# Patient Record
Sex: Male | Born: 1980 | Race: Black or African American | Hispanic: No | Marital: Single | Smoking: Never smoker
Health system: Southern US, Community
[De-identification: ages and names within clinical notes are randomized; demographics above are authoritative.]

## PROBLEM LIST (undated history)

## (undated) DIAGNOSIS — L97424 Non-pressure chronic ulcer of left heel and midfoot with necrosis of bone: Secondary | ICD-10-CM

## (undated) DIAGNOSIS — L03032 Cellulitis of left toe: Secondary | ICD-10-CM

## (undated) DIAGNOSIS — N186 End stage renal disease: Secondary | ICD-10-CM

## (undated) DIAGNOSIS — I10 Essential (primary) hypertension: Secondary | ICD-10-CM

## (undated) DIAGNOSIS — L02612 Cutaneous abscess of left foot: Secondary | ICD-10-CM

## (undated) DIAGNOSIS — Z992 Dependence on renal dialysis: Secondary | ICD-10-CM

## (undated) DIAGNOSIS — E1143 Type 2 diabetes mellitus with diabetic autonomic (poly)neuropathy: Secondary | ICD-10-CM

## (undated) DIAGNOSIS — E119 Type 2 diabetes mellitus without complications: Secondary | ICD-10-CM

## (undated) DIAGNOSIS — A419 Sepsis, unspecified organism: Secondary | ICD-10-CM

## (undated) DIAGNOSIS — E08621 Diabetes mellitus due to underlying condition with foot ulcer: Secondary | ICD-10-CM

## (undated) DIAGNOSIS — K3184 Gastroparesis: Secondary | ICD-10-CM

## (undated) DIAGNOSIS — N289 Disorder of kidney and ureter, unspecified: Secondary | ICD-10-CM

## (undated) DIAGNOSIS — D649 Anemia, unspecified: Secondary | ICD-10-CM

## (undated) HISTORY — PX: AV FISTULA PLACEMENT: SHX1204

## (undated) HISTORY — PX: INSERTION OF DIALYSIS CATHETER: SHX1324

## (undated) HISTORY — PX: EYE SURGERY: SHX253

---

## 1998-02-04 ENCOUNTER — Emergency Department (HOSPITAL_COMMUNITY): Admission: EM | Admit: 1998-02-04 | Discharge: 1998-02-04 | Payer: Self-pay | Admitting: Emergency Medicine

## 2000-11-14 ENCOUNTER — Inpatient Hospital Stay (HOSPITAL_COMMUNITY): Admission: EM | Admit: 2000-11-14 | Discharge: 2000-11-16 | Payer: Self-pay | Admitting: Emergency Medicine

## 2000-11-14 ENCOUNTER — Encounter: Payer: Self-pay | Admitting: Internal Medicine

## 2000-11-15 ENCOUNTER — Encounter: Payer: Self-pay | Admitting: Internal Medicine

## 2002-02-01 ENCOUNTER — Inpatient Hospital Stay (HOSPITAL_COMMUNITY): Admission: EM | Admit: 2002-02-01 | Discharge: 2002-02-06 | Payer: Self-pay | Admitting: Emergency Medicine

## 2002-02-03 ENCOUNTER — Encounter: Payer: Self-pay | Admitting: Family Medicine

## 2002-02-07 ENCOUNTER — Encounter: Admission: RE | Admit: 2002-02-07 | Discharge: 2002-02-07 | Payer: Self-pay | Admitting: Family Medicine

## 2002-03-25 ENCOUNTER — Inpatient Hospital Stay (HOSPITAL_COMMUNITY): Admission: EM | Admit: 2002-03-25 | Discharge: 2002-03-29 | Payer: Self-pay | Admitting: *Deleted

## 2002-03-25 ENCOUNTER — Encounter: Payer: Self-pay | Admitting: Emergency Medicine

## 2002-03-28 ENCOUNTER — Encounter: Payer: Self-pay | Admitting: Internal Medicine

## 2002-04-22 ENCOUNTER — Encounter: Payer: Self-pay | Admitting: Internal Medicine

## 2002-04-22 ENCOUNTER — Inpatient Hospital Stay (HOSPITAL_COMMUNITY): Admission: EM | Admit: 2002-04-22 | Discharge: 2002-04-24 | Payer: Self-pay | Admitting: Emergency Medicine

## 2002-12-01 ENCOUNTER — Encounter: Payer: Self-pay | Admitting: Emergency Medicine

## 2002-12-01 ENCOUNTER — Emergency Department (HOSPITAL_COMMUNITY): Admission: EM | Admit: 2002-12-01 | Discharge: 2002-12-01 | Payer: Self-pay | Admitting: Internal Medicine

## 2003-05-02 ENCOUNTER — Inpatient Hospital Stay (HOSPITAL_COMMUNITY): Admission: EM | Admit: 2003-05-02 | Discharge: 2003-05-03 | Payer: Self-pay | Admitting: Emergency Medicine

## 2003-08-27 ENCOUNTER — Emergency Department (HOSPITAL_COMMUNITY): Admission: EM | Admit: 2003-08-27 | Discharge: 2003-08-27 | Payer: Self-pay | Admitting: Emergency Medicine

## 2003-10-02 ENCOUNTER — Emergency Department (HOSPITAL_COMMUNITY): Admission: EM | Admit: 2003-10-02 | Discharge: 2003-10-02 | Payer: Self-pay | Admitting: Emergency Medicine

## 2004-03-29 ENCOUNTER — Inpatient Hospital Stay (HOSPITAL_COMMUNITY): Admission: EM | Admit: 2004-03-29 | Discharge: 2004-03-30 | Payer: Self-pay | Admitting: Emergency Medicine

## 2004-04-19 ENCOUNTER — Emergency Department (HOSPITAL_COMMUNITY): Admission: EM | Admit: 2004-04-19 | Discharge: 2004-04-19 | Payer: Self-pay | Admitting: Emergency Medicine

## 2004-04-22 ENCOUNTER — Inpatient Hospital Stay (HOSPITAL_COMMUNITY): Admission: EM | Admit: 2004-04-22 | Discharge: 2004-04-24 | Payer: Self-pay | Admitting: Emergency Medicine

## 2004-05-28 ENCOUNTER — Encounter: Payer: Self-pay | Admitting: Internal Medicine

## 2004-05-28 ENCOUNTER — Inpatient Hospital Stay (HOSPITAL_COMMUNITY): Admission: EM | Admit: 2004-05-28 | Discharge: 2004-05-29 | Payer: Self-pay | Admitting: Internal Medicine

## 2004-05-31 ENCOUNTER — Inpatient Hospital Stay (HOSPITAL_COMMUNITY): Admission: EM | Admit: 2004-05-31 | Discharge: 2004-06-01 | Payer: Self-pay | Admitting: Emergency Medicine

## 2004-09-19 ENCOUNTER — Ambulatory Visit: Payer: Self-pay | Admitting: Nurse Practitioner

## 2004-09-23 ENCOUNTER — Ambulatory Visit: Payer: Self-pay | Admitting: Nurse Practitioner

## 2004-09-23 ENCOUNTER — Ambulatory Visit: Payer: Self-pay | Admitting: *Deleted

## 2004-12-08 ENCOUNTER — Ambulatory Visit: Payer: Self-pay | Admitting: Nurse Practitioner

## 2004-12-09 ENCOUNTER — Emergency Department (HOSPITAL_COMMUNITY): Admission: EM | Admit: 2004-12-09 | Discharge: 2004-12-09 | Payer: Self-pay | Admitting: Emergency Medicine

## 2004-12-10 ENCOUNTER — Emergency Department (HOSPITAL_COMMUNITY): Admission: EM | Admit: 2004-12-10 | Discharge: 2004-12-10 | Payer: Self-pay | Admitting: Emergency Medicine

## 2004-12-11 ENCOUNTER — Inpatient Hospital Stay (HOSPITAL_COMMUNITY): Admission: EM | Admit: 2004-12-11 | Discharge: 2004-12-13 | Payer: Self-pay | Admitting: Emergency Medicine

## 2004-12-14 ENCOUNTER — Inpatient Hospital Stay (HOSPITAL_COMMUNITY): Admission: EM | Admit: 2004-12-14 | Discharge: 2004-12-16 | Payer: Self-pay | Admitting: Emergency Medicine

## 2005-02-16 ENCOUNTER — Ambulatory Visit: Payer: Self-pay | Admitting: Nurse Practitioner

## 2005-03-28 ENCOUNTER — Inpatient Hospital Stay (HOSPITAL_COMMUNITY): Admission: EM | Admit: 2005-03-28 | Discharge: 2005-03-29 | Payer: Self-pay | Admitting: Emergency Medicine

## 2005-03-31 ENCOUNTER — Ambulatory Visit: Payer: Self-pay | Admitting: Internal Medicine

## 2005-03-31 ENCOUNTER — Ambulatory Visit: Payer: Self-pay | Admitting: Infectious Diseases

## 2005-03-31 ENCOUNTER — Inpatient Hospital Stay (HOSPITAL_COMMUNITY): Admission: EM | Admit: 2005-03-31 | Discharge: 2005-04-05 | Payer: Self-pay | Admitting: Emergency Medicine

## 2005-04-02 ENCOUNTER — Ambulatory Visit: Payer: Self-pay | Admitting: Internal Medicine

## 2010-06-11 ENCOUNTER — Emergency Department (HOSPITAL_COMMUNITY): Admission: EM | Admit: 2010-06-11 | Discharge: 2010-06-11 | Payer: Self-pay | Admitting: Emergency Medicine

## 2010-12-11 LAB — CBC
HCT: 31.8 % — ABNORMAL LOW (ref 39.0–52.0)
Hemoglobin: 10.9 g/dL — ABNORMAL LOW (ref 13.0–17.0)
MCH: 30.7 pg (ref 26.0–34.0)
MCHC: 34.3 g/dL (ref 30.0–36.0)
MCV: 89.5 fL (ref 78.0–100.0)
Platelets: 391 10*3/uL (ref 150–400)
RBC: 3.56 MIL/uL — ABNORMAL LOW (ref 4.22–5.81)
RDW: 13.2 % (ref 11.5–15.5)
WBC: 6 10*3/uL (ref 4.0–10.5)

## 2010-12-11 LAB — GLUCOSE, CAPILLARY
Glucose-Capillary: 130 mg/dL — ABNORMAL HIGH (ref 70–99)
Glucose-Capillary: 69 mg/dL — ABNORMAL LOW (ref 70–99)
Glucose-Capillary: 84 mg/dL (ref 70–99)

## 2010-12-11 LAB — COMPREHENSIVE METABOLIC PANEL
ALT: 17 U/L (ref 0–53)
AST: 22 U/L (ref 0–37)
Albumin: 3.3 g/dL — ABNORMAL LOW (ref 3.5–5.2)
Alkaline Phosphatase: 101 U/L (ref 39–117)
BUN: 21 mg/dL (ref 6–23)
CO2: 27 mEq/L (ref 19–32)
Calcium: 9.2 mg/dL (ref 8.4–10.5)
Chloride: 106 mEq/L (ref 96–112)
Creatinine, Ser: 2.48 mg/dL — ABNORMAL HIGH (ref 0.4–1.5)
GFR calc Af Amer: 37 mL/min — ABNORMAL LOW (ref >60)
GFR calc non Af Amer: 31 mL/min — ABNORMAL LOW (ref >60)
Glucose, Bld: 104 mg/dL — ABNORMAL HIGH (ref 70–99)
Potassium: 3.9 mEq/L (ref 3.5–5.1)
Sodium: 141 mEq/L (ref 135–145)
Total Bilirubin: 0.4 mg/dL (ref 0.3–1.2)
Total Protein: 6.9 g/dL (ref 6.0–8.3)

## 2010-12-11 LAB — DIFFERENTIAL
Basophils Absolute: 0.1 10*3/uL (ref 0.0–0.1)
Basophils Relative: 1 % (ref 0–1)
Eosinophils Absolute: 0.3 10*3/uL (ref 0.0–0.7)
Eosinophils Relative: 4 % (ref 0–5)
Lymphocytes Relative: 52 % — ABNORMAL HIGH (ref 12–46)
Lymphs Abs: 3.1 10*3/uL (ref 0.7–4.0)
Monocytes Absolute: 0.4 10*3/uL (ref 0.1–1.0)
Monocytes Relative: 7 % (ref 3–12)
Neutro Abs: 2.2 10*3/uL (ref 1.7–7.7)
Neutrophils Relative %: 36 % — ABNORMAL LOW (ref 43–77)

## 2010-12-11 LAB — URINE MICROSCOPIC-ADD ON

## 2010-12-11 LAB — URINALYSIS, ROUTINE W REFLEX MICROSCOPIC
Bilirubin Urine: NEGATIVE
Glucose, UA: NEGATIVE mg/dL
Ketones, ur: NEGATIVE mg/dL
Leukocytes, UA: NEGATIVE
Nitrite: NEGATIVE
Protein, ur: >300 mg/dL — AB
Specific Gravity, Urine: 1.013 (ref 1.005–1.030)
Urobilinogen, UA: 0.2 mg/dL (ref 0.0–1.0)
pH: 6 (ref 5.0–8.0)

## 2011-02-13 NOTE — Discharge Summary (Signed)
NAMELOEL, DAGUE                             ACCOUNT NO.:  [id number]   MEDICAL RECORD NO.:  [id number]                   PATIENT TYPE:  INP   LOCATION:  [place]                                 FACILITY:  [facility]   PHYSICIAN:  Melissa L. Lovena Le, MD               DATE OF BIRTH:  [birth year]   DATE OF ADMISSION:  03/29/2004  DATE OF DISCHARGE:  03/30/2004                                 DISCHARGE SUMMARY   ADMITTING COMPLAINT:  1. Nausea and vomiting.  2. Diabetic ketoacidosis resulting in #1.   DISCHARGE DIAGNOSIS:  Diabetic ketoacidosis likely secondary to  noncompliance.   MEDICATIONS AT THE TIME OF DISCHARGE:  70/30 Insulin 20 units b.i.d.   HISTORY OF PRESENT ILLNESS:  Patient is a 30 year old African-American male  with a frequent past history for medical noncompliance who comes to the  emergency room with 24 hours of nausea and vomiting.  Patient could not  state any inciting factor and in the emergency room was found to be in DKA  with a blood glucose of 365, CO2 of 9, positive serum ketones, his pH on  admission was 7.19 with a pCO2 of 15.3, pO2 of 123, and a bicarb of 5.7.  In  the emergency room the patient was given 10 units of Regular Insulin IV and  started on a Glucomander protocol, he was aggressively rehydrated, his  potassium was repleted, he was admitted to the telemetry floor for further  evaluation.  Within 12 hours the patient's gap closed, he was able to keep  some food down.  Forty-eight hours after admission the patient was taking  his 70/30 Insulin, tolerating full meals and did not complain of any nausea  and vomiting.   On the day of discharge his vital signs were a temperature of [id number] with a  systolic blood pressure of 142/89, pulse of 85, respiratory rate of 18, his  blood glucoses ranged from 74 to 256.  His physical exam remained a well-  developed, well-nourished African-American male in no acute distress; his  pupils are equal, round, and reactive to  light, extraocular muscles are  intact; mucous membranes are moist; his neck is supple, there is no JVD or  lymph nodes; chest is clear to auscultation; cardiovascular regular rate and  rhythm, positive S1, S2, no S3, S4, no murmurs, rubs, or gallops; abdomen is  soft, nontender, nondistended, with positive bowel sounds; extremities  showed no clubbing, cyanosis, or edema.   Other pertinent laboratory values reveal no obvious infection; his  urinalysis was negative; an HIV test was done as the patient did have a  history of genital herpes, this was not reactive; amylase and lipase were  negative; his urine drug screen did show marijuana recently and the patient  was counseled on the use of and abuse of illicit drugs.   At the time of discharge the patient was provided with a  vial of 70/30 and  information on following up with [facility] who is his primary healthcare  [facility].  The patient was counseled on obtaining a Glucometer and he  was requested to follow up with [facility]'s diabetic educator.  The  patient has been noncompliant in the past and did not appear to understand  the significance of maintaining his glucose control.  This was therefore  reintroduced to him.                                               Melissa L. Lovena Le, MD    MLT/MEDQ  D:  03/31/2004  T:  03/31/2004  Job:  [id number]   cc:   Karoline Caldwell

## 2011-02-13 NOTE — H&P (Signed)
[name], [name]                 ACCOUNT NO.:  [id number]   MEDICAL RECORD NO.:  [id number]          PATIENT TYPE:  INP   LOCATION:  0101                         FACILITY:  [facility]   PHYSICIAN:  Corinna L. Conley Canal, MDDATE OF BIRTH:  07-13-1981   DATE OF ADMISSION:  12/14/2004  DATE OF DISCHARGE:                                HISTORY & PHYSICAL   CHIEF COMPLAINT:  Vomiting.   HISTORY OF PRESENT ILLNESS:  The patient is a 30 year old black male with  type 1 diabetes who just left against medical advice from [facility] from the [facility] service. He had been admitted for nausea  and vomiting and according to him he was told that he had a virus. I have  no chart available. I have the H&P and some lab work but no discharge  summary. He was tolerating clear liquids yesterday and left against medical  advice but returned with vomiting. He is unable to even keep down liquids.  He has vomited here a bit while here in the emergency room.   PAST MEDICAL HISTORY:  Type 1 diabetes since age 49.   MEDICATIONS:  1.  Phenergan tablets which are not helping him.  2.  Insulin 70/30 with 15 units subcutaneously b.i.d.   SOCIAL HISTORY:  Reviewed and as per previous.   FAMILY HISTORY:  Reviewed and as per previous.   REVIEW OF SYMPTOMS:  As above. Otherwise negative.   PHYSICAL EXAMINATION:  VITAL SIGNS:  Temperature is 100, blood pressure  137/87, pulse 108, respiratory rate 32. Oxygen saturation 100% on room air.  GENERAL:  The patient is groggy black male in no acute distress.  HEENT:  Normocephalic and atraumatic. Pupils are equal, round, and reactive  to light. Sclerae are nonicteric. Conjunctivae are pink. He has slightly dry  mucus membranes. Oropharynx is without erythema or exudates.  NECK:  Supple. No lymphadenopathy. No thyromegaly.  LUNGS:  Clear to auscultation bilaterally without wheezes, rhonchi, or  rales.  CARDIOVASCULAR:  Regular rate and rhythm without  murmurs, gallops, or rubs.  ABDOMEN:  Normal bowel sounds. Soft, nontender, and nondistended.  GENITOURINARY:  Deferred.  RECTAL:  Deferred.  EXTREMITIES:  No clubbing, cyanosis, or edema.  SKIN:  No rash.  PSYCHIATRIC:  Normal affect.  NEUROLOGICAL:  Groggy but oriented. Cranial nerves and sensory motor exam  are intact.   LABORATORY DATA:  CBC is unremarkable. Sodium was 134, glucose is 241.  Otherwise his basic metabolic panel is unremarkable. His total bilirubin is  5.0. Otherwise his LFTs are within normal limits. UA reveals greater than  1000 glucose, moderate bilirubin, greater than 80 ketones.  Negative  nitrites, negative leukocyte esterase.   ASSESSMENT:  1.  Recurrent nausea and vomiting: Most likely secondary to viral      gastroenteritis. I have asked for old records but they are unavailable.      It does look as if he had an acute abdominal series and his chest x-ray      during this last hospitalization which were unremarkable. He had a  lipase that was normal. His total bilirubin last time was 3. Given his      increased bilirubin, I will check hepatitis A, B, and C screen, although      the rest of his liver function tests are normal. He has no abdominal      tenderness or pain to suggest gallbladder disease. He will be given      Reglan and p.r.n. Phenergan, intravenous fluids, Protonix.  2.  Type 1 diabetes:  I will continue his 70/30 insulin and give sliding      scale.      CLS/MEDQ  D:  12/14/2004  T:  12/15/2004  Job:  [id number]   cc:   Karoline Caldwell

## 2011-02-13 NOTE — H&P (Signed)
NAMEHUTCHISON, REDDISH                 ACCOUNT NO.:  [id number]   MEDICAL RECORD NO.:  [id number]          PATIENT TYPE:  EMS   LOCATION:  ED                           FACILITY:  [facility]   PHYSICIAN:  Mobolaji B. Bakare, M.D.DATE OF BIRTH:  1981-08-05   DATE OF ADMISSION:  03/28/2005  DATE OF DISCHARGE:                                HISTORY & PHYSICAL   PRIMARY CARE PHYSICIAN:  [facility].   CHIEF COMPLAINT:  Nausea and vomiting for two weeks, low-grade fever two  days.   HISTORY OF PRESENTING COMPLAINT:  Mr. Rotz is a 30 year old African  American male with a history of type 1 diabetes mellitus since the age of  32.  He has always had recurring nausea and vomiting in the past on multiple  hospitalizations.  He started experiencing recurrent nausea, vomiting for  about two weeks, almost every morning and the patient was able to keep  things down but this got worse yesterday.  In the last two days such that he  is unable to retain fluid, and he also has low-grade fever but no chills.  He had some abdominal cramping.  No diarrhea.  He decided to come to the  emergency department.  His appetite is down.  He has been unable to keep  anything down.  He denies cough, chest pain, shortness of breath.  The  patient experienced one episode of blood, after vomiting today.  There was  no dizziness.   REVIEW OF SYSTEMS:  No dysuria, urgency, or increased frequency of  micturition.  No headaches.  No change in his vision.  No peripheral  neuropathy symptoms.   PAST MEDICAL HISTORY:  1.  Recurrent nausea and vomiting.  2.  Type 1 diabetes mellitus since the age of 89.   MEDICATIONS:  Insulin 70/30, 10 units b.i.d.   ALLERGIES:  No known drug allergies.   FAMILY HISTORY:  Significant for diabetes mellitus in both maternal and  paternal lineages.  His Mom has diabetes and she is on p.o. medications  __________  of complications of kidney failure.   SOCIAL HISTORY:  He does not smoke  cigarettes, does not drink alcohol.  He  is a Dealer.   PHYSICAL EXAMINATION:  VITAL SIGNS:  Blood pressure 122/78, pulse of 95,  respiratory rate of 20, temperature 99.2, O2 sat 100% on room air.  GENERAL:  The patient looks miserable, not pale, anicteric, mildly  dehydrated.  NECK:  No elevated JVD.  No carotid bruit.  No cervical lymphadenopathy.  LUNGS:  Clear, clinically, to auscultation.  CVS:  S1 S2 regular.  No murmur.  No gallop.  ABDOMEN:  Not distended.  Soft, not tender.  Bowel sounds present.  No  hepatosplenomegaly.  EXTREMITIES:  No pedal edema.  No calf tenderness.  Dorsalis pedis pulses  palpable bilaterally.  CNS:  No focal neurological deficit.   INITIAL LABORATORY DATA:  White cells 10.7, hemoglobin 13.8, hematocrit  40.3, MCV 88.8, platelets 270, neutrophils 90%, lymphocytes 9%.  Sodium 138,  potassium 3.7, chloride 105, bicarb 25, glucose 192, BUN 7, creatinine 0.7,  calcium 8.9.  Acetone negative.   ASSESSMENT:  Mr. Nou is a 30 year old African American male with a history  of type 1 diabetes presenting with nausea, vomiting, hematemesis, low-grade  fever, and mild leukocytosis with abdominal cramps.  He is not in diabetic  ketoacidosis.  He used his insulin today.   PLAN:  1.  Nausea and vomiting.  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  We will give Zofran      4 mg IV q.8h. for 48 hours, Reglan 10 mg IV q.a.c. and h.s., Phenergan      12.5 mg IV q.4h. p.r.n., IV fluid normal saline with 10 mEq of KCl at      125 cc/hr, Dilaudid 1 mg IV q.4h. p.r.n. for pain.  We will obtain an      abdominal x-ray to rule out small-bowel obstruction, amylase, lipase,      and liver function tests.  2.  We will put on clear liquids.  The patient may need an upper endoscopy      to further evaluate recurrent nausea and vomiting.  3.  Hematemesis, probably Mallory-Weiss tear.  We will start on  Protonix 40      mg IV every day.  4.  Diabetes mellitus.  As per the patient and patient's family, diabetes      seems to be uncontrolled and the patient does not check his CBG often.      He may need four injections per day.  I will ask diabetic educator to      see the patient for education and will start Lantus 10 units q.a.m. and      sliding scale q.a.c. with NovoLog using insulin sensitive.  Check      hemoglobin A1c and fasting lipid profile.       MBB/MEDQ  D:  03/28/2005  T:  03/28/2005  Job:  [id number]

## 2011-02-13 NOTE — Discharge Summary (Signed)
[place]  Patient:    [name], [name]                          MRN: [id number] Adm. Date:  [id number] Disc. Date: [id number] Attending:  Phifer, Izora Gala Welcome Dictator:   Idamae Schuller, M.D.             Gloris Ham, M.D. [facility]  [id number]   Discharge Summary  DISCHARGE DIAGNOSES: 1. Diabetic ketoacidosis. 2. Diabetes mellitus type 1, uncontrolled, with hemoglobin A1C of 10.6.  DISCHARGE MEDICATIONS: 1. Insulin 70/30 15 units subcutaneous q.a.m. and 15 units subcutaneous    q.p.m. 2. The patient is to check his CBGs b.i.d. daily.  HISTORY OF PRESENT ILLNESS:  [name] is a 30 year old African-American male with a history of diabetes mellitus type 1 since age 8.  He presents with acute onset of nausea, vomiting, and abdominal pain for the past three days. The patient was very nauseous and was vomiting during our interview.  His vomitus was clear material with some brown particles.  No gross blood or coffee-grounds material.  The patient states that he ran out of his 70/30 insulin approximately two days ago and that he has been using Regular Insulin 10 units twice a day since then.  The patient does not have a glucometer.  The patient denies diarrhea, hematochezia, fever, or chills.  He also denies dysuria, frequency, or urgency.  The patient also denies cough or symptoms of an upper respiratory infection.  ALLERGIES:  No known drug allergies.  PHYSICAL EXAMINATION:  VITAL SIGNS:  On admission temperature 98.4, blood pressure 131/80, pulse 102, respirations 20, oxygen saturation 100% on room air.  GENERAL:  Ill-appearing African-American young man who is nauseous and restless, with shallow breathing.  HEENT:  Normocephalic, atraumatic.  PERRLA.  Dry mucous membranes.  NECK:  No JVD.  No lymphadenopathy.  Supple.  LUNGS:  Clear to auscultation bilaterally.  CARDIOVASCULAR:  Tachycardia, with regular rhythm.  No murmurs, rubs,  or gallops.  ABDOMEN:  Soft, mildly tender diffusely.  No rebound, no guarding. Hyperactive bowel sounds.  EXTREMITIES:  No edema.  Bounding pedal pulses bilaterally.  NEUROLOGIC:  Alert and oriented x 3.  Cranial nerves II-XII grossly intact. No motor or sensory deficits.  ADMISSION LABORATORY DATA:  Hemoglobin 16.1, platelets 336, white count 10.3. Sodium 135, potassium 5.8, bicarbonate 15, chloride 103, BUN 10, creatinine 0.5, glucose 326.  Anion gap 18.  There was small acetone in his serum. Urinalysis showed ketones 780, protein 100, glucose 1000, hemoglobin negative. A pH on i-STAT was 7.26, pCO2 of 30, and bicarbonate of 15.  Chest x-ray did not show any infiltrates or evidence of acute disease.  HOSPITAL COURSE: #1 - DIABETIC KETOACIDOSIS:  The patient was aggressively hydrated with 1 L of normal saline wide open and with another 2 L over the ensuing two to four hours.  The patient was clearly in DKA, having a bicarbonate of 15, a pH of 7.26, ketones in his urine, and a glucose greater than 250.  He also had a metabolic alkalosis with an anion gap of 18.  The patient was admitted to a stepdown unit where his serum glucose levels were monitored on the hour, and his BMETs were obtained every two hours.  An insulin drip was started at 4 units of insulin per hour.  By 11 p.m. on the night of admission his BMET was sodium 135, potassium 4.5, chloride 107, bicarbonate  10, glucose 262, BUN 12, creatinine 1.5.  His anion gap was 15.  We continued the insulin drip as well as normal saline at the rate of 200 cc an hour.  The following morning his potassium had to be replaced with 40 mEq of K-Dur by mouth.  He was never hypokalemic per laboratories; however, we were trying to prevent this from happening when his potassium dropped to 4.0.  By 5 a.m. his anion gap was 13, and his bicarbonate was 15.  At this point we decided to increase his insulin drip to 5 units per hour and to  continue with D-5 normal saline since his glucose had dropped to approximately 200.  By 1 in the afternoon on hospital day #2 his anion gap had closed, and his bicarbonate level was 20.  At this point his insulin drip was discontinued, and his IV fluids were switched from D-5 normal saline to normal saline at a rate of 150 cc an hour.  The patient was allowed to eat a diabetic diet for dinner, which he tolerated well.  Prior to dinner he was given 12 units of 70/30 insulin.  The insulin drip was discontinued one hour after the insulin was injected.  The patient tolerated dinner well and continued to do well into the morning.  His BMET on hospital day #3 and the day of discharge was showing a sodium of 136, a potassium of 4.0, a chloride of 107, a bicarbonate of 23, BUN of 8, creatinine of 0.8, and a glucose of 218.  The patient was deemed out of DKA and stable.  All IV fluids were discontinued, and the patient was started on a regular schedule of subcutaneous insulin.  #2 - DIABETES MELLITUS TYPE 1, UNCONTROLLED:  The patient has a hemoglobin A1C of 10.6 on this admission.  This is clearly indicative of poor control of his diabetes.  This is consistent with a history of the patient not having seen a physician for several months.  This patient received diabetic education during this hospitalization and was provided with a glucometer and instructed on its use.  The patient is to follow up in our internal medicine clinic.  He will be discharged on 15 units of 70/30 insulin twice a day.  I will follow up with this patient in approximately one week.  He has been instructed to keep a record of his capillary blood sugars twice a day until the appointment next week.  At that time I will review his glucose levels and determine if he needs an adjustment in his dose of daily insulin. DD:  11/16/00 TD:  11/17/00 Job: [id number]

## 2011-02-13 NOTE — H&P (Signed)
NAMELILLIE, [name]                 ACCOUNT NO.:  [id number]   MEDICAL RECORD NO.:  [id number]          PATIENT TYPE:  INP   LOCATION:  0101                         FACILITY:  [facility]   PHYSICIAN:  Benito Mccreedy, M.D.DATE OF BIRTH:  1981/09/07   DATE OF ADMISSION:  12/11/2004  DATE OF DISCHARGE:                                HISTORY & PHYSICAL   CHIEF COMPLAINT:  Nausea and vomiting.   HISTORY OF PRESENT ILLNESS:  A 30 year old African-American gentleman with a  history of diabetes mellitus type 1 who presents with three days of nausea  and vomiting with generalized weakness.  He was seen in the emergency room  on December 09, 2004 for same. He was treated symptomatically and discharged  home in stable condition. The patient returned last night for same  complaints which has been persistent without any improvement. In the  emergency room, his sugar was noted to be more than 200, he was feeling weak  with decreased appetite and it was deemed necessary to admit patient for  management of his hyperglycemia and GI symptomatology. The patient denies  any history of fever or chills, chest pain, shortness of breath.  He denies  any PND, orthopnea, lower extremity edema or swelling. He does not have any  lower extremity pain. He indicates that he has had some belly soreness due  to retching related to vomiting.   PAST MEDICAL HISTORY:  The patient was admitted in September 2005 for DKA.  He has had several admissions for this. He has a history of medical  noncompliance and use of marijuana. He has had a history of type 1 diabetes  since age 87.   MEDICATIONS:  The patient does not have any medication allergies. He takes  Novolin 70/30 twice daily on a sliding scale.   SOCIAL HISTORY:  He denies any history of cigarette smoking or illicit drug  use.   REVIEW OF SYSTEMS:  As per HPI otherwise unremarkable.   PHYSICAL EXAMINATION:  VITAL SIGNS:  Blood pressure was 129/71, pulse -3,  respirations 16, O2 sat of 99% on room air. Temperature 99.4.  GENERAL:  On the day of admission, the patient looked weak, however, he was  not in acute cardiopulmonary distress.  HEENT:  He had marked mucous membrane dryness without erythema.  NECK:  Supple, no JVD.  LUNGS:  Clear to auscultation bilaterally.  ABDOMEN:  Soft, nontender. Bowel sounds present and normoactive.  EXTREMITIES:  No cyanosis, no edema.  CNS:  Nonfocal.   LABORATORY DATA:  His CBC was essentially within normal limits. Sodium of  132, potassium 30.6, chloride 99, CO2 19, glucose 58, BUN 6, creatinine 0.9.  Alkaline phosphatase 88, SGOT 17, SGPT 14. Total protein 6.3, albumin 3.7,  calcium 8.9.  __________ blood was small, lipase 10.  UA was negative for  any __________ infections.   IMPRESSION:  1.  Hyperglycemia with pseudohyponatremia.  2.  Intractable nausea and vomiting secondary to hyperglycemia with      pseudohyponatremia, rule out gastroparesis in diabetic.   PLAN:  Admit patient for antiemetics and prokinetic's with IV  fluid  supplementation and monitoring of his electrolytes. Will get a 12 lead EKG  and cardiac enzymes x1 for completeness sake.      GO/MEDQ  D:  12/11/2004  T:  12/11/2004  Job:  [id number]

## 2011-02-13 NOTE — H&P (Signed)
[name], [name]                             ACCOUNT NO.:  [id number]   MEDICAL RECORD NO.:  [id number]                   PATIENT TYPE:  INP   LOCATION:  0160                                 FACILITY:  [facility]   PHYSICIAN:  Melissa L. Lovena Le, MD               DATE OF BIRTH:  Jun 14, 1981   DATE OF ADMISSION:  04/22/2004  DATE OF DISCHARGE:                                HISTORY & PHYSICAL   CHIEF COMPLAINT:  Somnolence secondary to diabetic ketoacidosis.   PRIMARY CARE PHYSICIAN:  Unassigned.   HISTORY OF PRESENT ILLNESS:  The patient is a 30 year old African-American  male who presents via EMS with decreased level of consciousness.  He was  found to be in DKA in the emergency room with a glucose 524.  Anion gap was  25.  The patient is somnolent and unable to give a full history, however, he  mumbles that he is nauseous.  It was too busy at [facility] for him to  bother staying to be looked at.  It appears that on April 19, 2004 that he  came to the emergency room of [facility] requesting Insulin be provided for  him.  He refused to have his blood drawn, he refused to be worked up and  then left AMA.  At that time he also was complaining of some acneiform rash  on his forehead which persists.   REVIEW OF SYSTEMS:  At this time review of systems is limited to the patient  stating that he is nauseous and feels like he needs to vomit.  Otherwise  unable to obtain a full review of systems.   PAST MEDICAL HISTORY:  1. 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  2. Diabetic since the age of 48.  30. He is medically noncompliant on many fronts.   PAST SURGICAL HISTORY:  None.   SOCIAL HISTORY:  Occasionally smokes cigarettes.  His last urine drug screen  was positive for marijuana.  He denies ethanol use.  He is a Ship broker at [employer].   FAMILY HISTORY:  Essentially noncontributory at this point.  However his  father is deceased at the age of 48 secondary to  hypertensive renal disease.   PHYSICAL EXAMINATION:  VITAL SIGNS:  On admission his temperature is 98.1,  blood pressure 130/70, pulse 96 to 103, respiratory rate is 12, saturation  is 99%.  GENERAL:  Consistent with somnolent behavior, able to be aroused with loud  verbal stimuli.  However he mumbles his responses.  He does not appear in  acute distress but is definitely with a decreased level of consciousness.  HEENT:  Normocephalic, atraumatic.  Pupils are equal, round and reactive to  light. His tongue is coated, mucous membranes are dry.  Extraocular muscles  are intact.  NECK:  Supple, there is no JVD, no lymph nodes.  SKIN:  He does have acneiform  rash on his forehead.  CHEST:  Clear to auscultation, no rhonchi, rales or wheezes.  CARDIOVASCULAR:  Tachycardic with positive S1, S2, no S3 or S4, no murmurs,  rubs, or gallops.  ABDOMEN:  Soft, nontender, nondistended with positive bowel sounds.  No  hepatosplenomegaly.  EXTREMITIES:  Are warm, 2+ pulses. Power 4 out of 5 bilaterally in all  extremities.  No clubbing or cyanosis.  NEURO:  It is difficult for him to follow commands but he does not appear to  have any focal deficits.   LABORATORY DATA:  On admission revealed sodium of 130, potassium of 4.7,  chloride of 93, CO2 of 12, BUN of 14, creatinine is 1.4, glucose is 524.  A  venous gas reveals a pH of 7.18 with a CO2 of 29.9, PO2 of 51, and bicarb of  10.8.  This gap is 25.  EKG shows sinus tachycardia on the monitor.   ASSESSMENT AND PLAN:  1. This is a 30 year old African-American male who presents in diabetic     ketoacidosis likely secondary to noncompliance since he presented on April 19, 2004 requesting Insulin.  I  have discussed this case with the     patient's mother and informed her that he is here.  Attempted to explore     whether the patient is experiencing any problems at home that might be     causing him not to take his Insulin.  She is unaware of any  issues.  She     states that he may have some underlying depression related to just his     disease itself and the fact that his father died at a relatively young     age.  The patient on discharge at the last admission was set up with our     outpatient educator who he did see, and I do believe that he picked up     his syringes and Glucometer as I received paper work to that effect     though it is difficult to understand why the patient is now being     noncompliant.  We will attempt to obtain more history when he is more     awake and alert.  We will admit patient to the ICU step down area on a     Glucomander with fluid resuscitation.  Check his CBC and will be checking     his BMETs q.2h.   1. We will request social services to consult when the patient is able to     participate.   1. PAS hose will be used for deep venous thrombosis prophylaxis.   1. Will provide Protonix and Zofran to treat his nausea.   1. Will check a UA, CNS and UDS.   1. Will check a TSH.                                               Melissa L. Lovena Le, MD    MLT/MEDQ  D:  04/22/2004  T:  04/22/2004  Job:  [id number]

## 2011-02-13 NOTE — Discharge Summary (Signed)
[place]  Patient:    [name], [name] Visit Number: [id number] MRN: [id number]          Service Type: Attending:  Patrick Jupiter A. Walker Kehr, M.D. Dictated by:   Elvis Coil, M.D. Adm. Date:  02/01/02 Disc. Date: 02/06/02   CC:         [facility]   Discharge Summary  DATE OF BIRTH:  22-Oct-1980  DISCHARGE DIAGNOSIS:  Diabetic ketoacidosis.  DISCHARGE MEDICATIONS:   The patient left against medical advice, so we assume that he will return to his admission medication regimen. 1. Insulin 70/30, 10 units q.a.m. and q.p.m. 2. Regular insulin 5 units b.i.d.  DISCHARGE INSTRUCTIONS:  None were provided as the patient left against medical advice.  HOSPITAL COURSE:  This is a 30 year old African-American male with known diabetes mellitus, type 1, admitted with DKA.  On admission, his pH was 7.0, bicarbonate 2, potassium. 6.5, sodium 128.  White blood cells 53.1.  Glucose on admission was 602, and his anion gap was 27.  The patient had recently been incarcerated in the [facility] jail for an altercation at home with his wife.  He reported that he had not been given a sufficient insulin dose during the day prior to admission.  He denied any illness.  The patient was admitted for DKA and aggreessively rehydrated with IV fluids.  He was initially placed on an insulin drip which was later changed to subcutaneous.  His anion gap resolved.  Over the next 24 hours, the patients anion gap resolved.  His glucose came down to the 100s, and his potassium level resolved to normal.  Potassium was supplemented intravenously.  The patients acidosis also resolved with IV fluid hydration and insulin drip.  The patient complained of abdominal pain on admission and had episodes of nausea and vomiting for the first three hospital days.  An abdominal film done during hospitalization was normal.  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  As well, the patients leukocytosis decreased during hospitalization to 13.5 two days prior to discharge.  The patient had low-grade temperature to 100 during admission.  Abdominal exam was benign with no peritoneal signs.  The patient was started on an ACE inhibitor during hospitalization.  As well, a urine drug screen was negative on admission except for marijuana.  By hospital day #6, the patients electrolyte and pH abnormalities had completely resolved.  His CBGs varied from the low 100s to the low 300s.  He had no further episodes of vomiting and was tolerating a regular diet.  Our plan was to discharge the patient on an increased dose of 70/30 insulin as well as a sliding scale and an ACE inhibitor and to have him follow up at [facility] shortly after discharge.  However, the patient left the hospital against medical advice before discharge instructions or medications could be given. Dictated by:   Elvis Coil, M.D. Attending:  Arty Baumgartner. Walker Kehr, M.D. DD:  02/07/02 TD:  02/09/02 Job: [id number]

## 2011-02-13 NOTE — H&P (Signed)
[name], [name]                             ACCOUNT NO.:  [id number]   MEDICAL RECORD NO.:  [id number]                   PATIENT TYPE:  EMS   LOCATION:  ED                                   FACILITY:  [facility]   PHYSICIAN:  Melissa L. Lovena Le, MD               DATE OF BIRTH:  1981-03-02   DATE OF ADMISSION:  03/29/2004  DATE OF DISCHARGE:                                HISTORY & PHYSICAL   PRIMARY CARE PHYSICIAN:  Unassigned.   CHIEF COMPLAINT:  Nausea and vomiting.   HISTORY OF PRESENT ILLNESS:  Patient is a 30 year old African-American male  who states that 2 days ago he woke up feeling poorly, he could not specify  what symptoms he was having but states that later in the day he developed  nausea and vomiting and was unable to keep any food down.  Patient does  admit to a cough and this morning had a little bit of hematemesis with his  vomiting.  He relates one episode of diarrhea yesterday.  He denies any sick  contacts or eating any food that could potentially be considered suspicious.  The patient is difficult to obtain information from as he is nonspecific  about his symptomatology.   PAST SURGICAL HISTORY:  None.   PAST MEDICAL HISTORY:  Medical noncompliance, diabetes, and genital herpes.   SOCIAL HISTORY:  He denies tobacco, ethanol, and illicit drug use.   FAMILY HISTORY:  Noncontributory.   ALLERGIES:  No known drug allergies.   MEDICATIONS:  70/30 Insulin 20 units in the morning and 20 units at night.   REVIEW OF SYSTEMS:  Positive nausea/vomiting; positive cough; no sputum;  question of hematemesis; positive one episode of diarrhea; no melena; no  hematochezia; no fever; no chills; all other review of systems are negative.   PHYSICAL EXAMINATION:  VITAL SIGNS:  Temperature is 97.2, blood pressure  147/71, pulse is 80-111, respiratory rate is 20, saturations are on room  air.  GENERAL:  This is a young well-developed, well-nourished African-American  male in  mild distress secondary to feeling ill and with mild tachypnea.  HEENT:  He is normocephalic, atraumatic.  Pupils are equal, round and  reactive to light.  Extraocular muscles are intact.  Mucous membranes are  dry.  NECK:  His neck is supple.  There is no JVD, no lymph nodes.  CHEST:  His chest is clear to auscultation.  There is no rhonchi, rales, or  wheezes.  CARDIOVASCULAR:  Regular rate and rhythm at this time however, on admission  he was tachycardic.  Positive S1, S2; no S3, S4; no murmurs, rubs, or  gallops.  ABDOMEN:  Soft, nontender, nondistended; positive bowel sounds.  EXTREMITIES:  No clubbing, cyanosis, or edema.  NEUROLOGICAL:  He is awake, alert, oriented x3; cranial nerves II-XII are  intact; power is 5/5.   LABORATORIES:  White count of 15.4, hemoglobin of  15.5, hematocrit of 44.9,  platelets of 320; his sodium is 135, potassium is 5.4, chloride is 102, CO2  is 9, BUN is 8, creatinine is 1.4, his glucose is 365, his gap is 24; LFTs  are within normal limits, his alk phos is slightly elevated at 119, T-bili  is elevated at 3.5.   ASSESSMENT:  This is a 30 year old African-American male with nausea and  vomiting x2 days found to be diabetic ketoacidosis.  He states that he has  been compliant with his 70/30 and his last episode of diabetic ketoacidosis  was about a year ago.  He denies any fever or chills or sick contacts.   PLAN:  We will aggressively rehydrate this patient starting him on the  Glucomander protocol.  He has already had 10 units of insulin in the  emergency room and aggressive hydration.  We will replete his potassium when  it becomes less than 4.  We will check an amylase and lipase to rule out  other reasons for his nausea and vomiting, especially since his T-bili is at  3.5.  If his amylase and lipase are elevated showing pancreatitis we will do  an ultrasound of his abdomen and we will start Protonix IV once daily until  he is able to take by  mouth and we will check a chest x-ray to rule out any  occult pneumonia.  Patient has been counseled on allowing [place] to draw all  laboratories and be patient with treatment as it is documented in the old  chart that he often leaves AMA and refuses to have his blood drawn.  Once  his gap is closed we will discontinue his Glucomander and start him on 70/30  as well as allow him to eat.                                               Melissa L. Lovena Le, MD    MLT/MEDQ  D:  03/29/2004  T:  03/29/2004  Job:  [id number]

## 2011-02-13 NOTE — Discharge Summary (Signed)
. [facility]  Patient:    [name], [name] Visit Number: [id number] MRN: [id number]          Service Type: MED Location: [id number] Attending Physician:  Thomes Lolling Dictated by:   Julio Alm, M.D. Admit Date:  03/24/2002 Discharge Date: 03/29/2002   CC:         C. Milta Deiters, M.D.   Discharge Summary  DISCHARGE DIAGNOSES: 1. Diabetic ketoacidosis. 2. Hyperbilirubinemia. 3. Diabetes mellitus type 1, not controlled.  DISCHARGE MEDICATIONS:  Humulin 70/30, 15 units subcutaneous q.a.m., 10 units subcuticular q.p.m. The patient is to check his CBGs b.i.d.  HISTORY OF PRESENT ILLNESS:  The patient is a 30 year old African-American male with type 1 diabetes mellitus who presents to the emergency department following two days of nausea and vomiting. As a result of the nausea and vomiting, he was unable to keep any p.o.s down, but he continued taking his insulin. His grandmother stated that the last episode of emesis was very dark green and possibly black. The patient not experiencing any diarrhea. Given the subdued nature of the patient and the limited information available by the family, no other history was able to be obtained.  ALLERGIES:  No known drug allergies.  PHYSICAL EXAMINATION:  VITAL SIGNS:  On admission, temperature 98.1, blood pressure 151/84, pulse 132, respiratory rate 20, oxygen saturations 99% on room air.  GENERAL:  This is a young male who appears tachypneic with Kussmaul breathing. He is not talking; he is moaning.  HEENT:  Pupils equally round and reactive to light and accommodation. The extraocular muscles were intact. Sclerae anicteric. Oropharynx is clear.  NECK:  Supple.  LUNGS:  Clear to auscultation bilaterally without wheezes or rhonchi.  CARDIOVASCULAR:  Tachycardic, regular rhythm without murmurs, rubs or gallops.  ABDOMEN:  The patient is tender in the mid epigastric region, although  there is no guarding or rebound tenderness. There are positive bowel sounds.  EXTREMITIES:  Cool to the touch without cyanosis, clubbing or edema.  NEUROLOGIC:  Moves all extremities with no focal neurologic deficits appreciated.  ADMISSION LABORATORY DATA:  White blood cell count 19, hemoglobin 18.4, platelets 336. Basic metabolic profile revealed sodium 134, potassium 4.5, chloride 103, bicarbonate 10, BUN 23, creatinine 1.7, glucose 549. Total bilirubin 3.7, alkaline phosphatase 150, AST 18, ALT 23, total protein 8.9, albumin 4.7. Anion gap 21. Lipase 13.  RADIOLOGY:  Chest x-ray on admission showed no acute cardiopulmonary disease.  HOSPITAL COURSE:  #1 - DIABETIC KETOACIDOSIS:  The patient was clearly in diabetic ketoacidosis upon admission  with a pH of 7.205, a bicarbonate of 10 and a serum glucose of 567. His anion gap was also elevated at 21. The patient was therefore admitted to the intensive care unit. He was aggressively hydrated with IV normal saline bolus of 2 liters and then IV normal saline at a rate of 250 cc per hour. He was given a bolus of Regular Insulin of 10 units IV x 1 and started on an insulin drip at 5 units per hour. Basic metabolic profiles were checked every two hours to follow the patients bicarbonate, glucose and potassium levels.  Over the course of the first night, the patients bicarbonate climbed from 12 to 19. His potassium fell from 4.2 to 3.0. His glucose fell from 412 to 341. Clearly the acidosis was improving but was not resolved by the first morning. He continued with adequate hydration and the insulin drip. We replaced the potassium as needed.  Ultimately  when the patients bicarbonate reached 20, he was switched from an insulin drip over to insulin 70/30. The drip continued for one hour and then was discontinued following the administration of the p.o. medications. Unfortunately this process took longer than expected secondary to  some protracted nausea and vomiting by the patient.  However, by the morning of March 27, 2002, the DKA had resolved with labs as follows:  Potassium 3.7, bicarbonate 27, glucose 267. The patient was continued on his 70/30 insulin with 15 units q.a.m. and 10 units q.p.m., and also placed on a sliding-scale for further management.  #2 - HYPERBILIRUBINEMIA:  Why the patients nausea and vomiting persisted following the resolution of the diabetic ketoacidosis was not entirely understood. The patient did have an elevated total bilirubin on admission. This elevated level persisted and was 2.7 on March 28, 2002. Given the nausea and the increased bilirubin, we considered a right upper quadrant ultrasound to evaluate for possible gallbladder disease. We also planned to fractionate the bilirubin in order to fully understand the etiology of the hyperbilirubinemia.  However, on the morning of March 29, 2002, the patients nausea and vomiting had resolved. He was doing much better and tolerating p.o.s without difficulty. Because of his overall improved condition, he refused all laboratory draws in the morning. Therefore we were not able to obtain the fractionated bilirubin or any other laboratory values. 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  In addition to refusing all laboratory draws on March 29, 2002, the patient also decided to leave against medical advice. That morning he had blood glucoses ranging as high as 290. However, since he felt significantly better without nausea and vomiting, he felt that he was safe to leave. We wanted to further workup the hyperbilirubinemia and also ensure that his glucose was under control, although he insisted that he had to leave. Therefore he signed the [facility] sheet which is  present on the chart and left the hospital.   In discussing the patients decision with him, I informed him that it was important that he keep a close eye on his blood glucose to ensure that he was not readmitted for the same problem. He stated understanding of this, but insisted upon leaving.  As the result of the patients decision to leave against medical advice and the fact that he lives in [place], he will not be following up with our clinic. He was instructed to seek prompt follow up with the [facility] or urgent care facility that he goes to in [place]. We would like to send a copy of this discharge summary to his primary care physician, however, he is unable to provide information regarding whom that might be. Dictated by:   Julio Alm, M.D. Attending Physician:  Thomes Lolling DD:  04/02/02 TD:  04/04/02 Job: [id number]

## 2011-02-13 NOTE — Discharge Summary (Signed)
[name], [name]                             ACCOUNT NO.:  [id number]   MEDICAL RECORD NO.:  [id number]                   PATIENT TYPE:  INP   LOCATION:  3301                                 FACILITY:  [facility]   PHYSICIAN:  Jay Schlichter, M.D.               DATE OF BIRTH:  [birth year]   DATE OF ADMISSION:  05/01/2003  DATE OF DISCHARGE:  05/03/2003                                 DISCHARGE SUMMARY   DISCHARGE DIAGNOSES:  1. Diabetic ketoacidosis.  2. Patient left the hospital against medical advice.   REASON FOR HOSPITALIZATION:  This is a 30 year old male with a history of  prior admissions to [facility] for DKA, who reported to the  emergency room with a history of nausea and vomiting x5.  Patient reported  noncompliance with medication for the past two days secondary to an  inability to afford his medications.   LABS ON ADMISSION:  Patient had a sodium of 132, potassium 4.1, chloride  103, CO2 11, BUN 19, creatinine 1.1, glucose 290.  Alk phos 129, total  bilirubin 3.0 with an anion gap of 18.  White blood cell count 16.1,  hemoglobin 16.2, hematocrit 46.8, platelet count 290.  A urine drug screen  was positive for THC.   HOSPITAL COURSE:  Patient was admitted to the stepdown unit.  He was  hydrated aggressively.  He was started on an insulin drip at 7 cc/hr after a  fluid bolus.  Serial BMPs were taken to monitor potassium and to monitor the  closing of his anion gap.  The potassium was repleted with K phos, as  needed, in order to keep the patient's serum potassium within normal range.  During his hospital stay, the patient refused lab draws, and it became  difficult to accurately manage his serum glucose.  The patient was started  on his home regimen of 70/30, 15 units q.a.m. and 15 units q.p.m.  His diet  was advanced, and he was able to tolerate p.o. intake; however, prior to  completion of treatment, the patient decided to leave the hospital AMA.  IV  equipment  was DC'd, and the patient was discharged from the hospital against  medical advice after signing the appropriate forms.      Idolina Primer, MD                          Jay Schlichter, M.D.    HP/MEDQ  D:  09/26/2003  T:  09/27/2003  Job:  [id number]

## 2011-02-13 NOTE — H&P (Signed)
NAMEANITA, BOCOOK                             ACCOUNT NO.:  [id number]   MEDICAL RECORD NO.:  [id number]                   PATIENT TYPE:  EMS   LOCATION:  ED                                   FACILITY:  [facility]   PHYSICIAN:  Ashby Dawes. Polite, M.D.              DATE OF BIRTH:  1980/12/04   DATE OF ADMISSION:  05/31/2004  DATE OF DISCHARGE:                                HISTORY & PHYSICAL   CHIEF COMPLAINT:  Not feeling well.   HISTORY OF PRESENT ILLNESS:  [name] is a 30 year old male with type 1  diabetes, noncompliant, who was recently discharged from the hospital after  being admitted for DKA.  During that hospitalization, the patient was  treated in the customary fashion with IV insulin and IV fluids; however,  throughout the hospitalization, the patient refused to allow adequate  treatment, i.e., blood work.  On day of discharge, during that  hospitalization, the patient stated that he was feeling well, was  __________, did not want any more blood work, was ready to go home.  The  patient presents to the ED today not feeling well with obviously  hyperglycemia, metabolic acidosis, required admission for diabetic  ketoacidosis.   PAST MEDICAL HISTORY:  1.  Significant for type 1 diabetes since age 32.  2.  Repeated admissions for DKA.  3.  Medical noncompliance.  4.  Marijuana use.   MEDICATIONS ON ADMISSION:  Insulin 70/30 b.i.d. as well as sliding-scale  insulin.   SOCIAL HISTORY:  Denies tobacco, alcohol, or drugs; however, per last  admission, urine drug screen was positive.   PAST SURGICAL HISTORY:  None.   ALLERGIES:  Denies any allergies.   FAMILY HISTORY:  Father deceased secondary to kidney failure.   REVIEW OF SYSTEMS:  Significant for decreased p.o. intake, weakness,  malaise, hyperglycemia, some nausea and vomiting and occasional colored  sputum.  The patient denied any fever or chills.  No chest pain or shortness  of breath.   PHYSICAL EXAMINATION:   GENERAL:  The patient is very lethargic generally.  VITAL SIGNS:  Temp 98.1, BP 138/78, pulse 116, respiratory rate of 18,  saturating 98%.  HEENT:  Significant for dry oral mucosa.  No nodes.  No JVD.  CHEST:  Clear to auscultation bilaterally.  CARDIOVASCULAR:  Tachy.  ABDOMEN:  Soft, positive bowel sounds, no hepatosplenomegaly.  EXTREMITIES:  No cyanosis, clubbing, or edema.   DATA:  ABG:  A pH of 7.1, pCO2 of 15, pO2 125.  BMET:  Sodium 136, potassium  4.6, chloride 108, carbon dioxide 6, glucose 43.  BUNs and creatinine 1.3.   ASSESSMENT:  1.  Diabetic ketoacidosis.  2.  Noncompliance.   RECOMMENDATIONS:  The patient will be admitted to a medicine floor bed for  evaluation and treatment of diabetic ketoacidosis.  The patient will be  treated with aggressive IV fluids and IV insulin.  The patient will be  pancultured to rule out occult infection.  Will follow patient's  electrolytes closely if allowed.  Please note at the time of this dictation,  the patient stated that he does not think that he wants to come to the  hospital, wants to sit in the ER for 3-4 hours and see if he feels better.  I have explained to the patient that I am recommending he be admitted to the  hospital and treated for diabetic ketoacidosis.  If the patient leaves, it  will be against medical advance.                                               Ashby Dawes. Polite, M.D.    RDP/MEDQ  D:  05/31/2004  T:  05/31/2004  Job:  [id number]

## 2011-02-13 NOTE — Discharge Summary (Signed)
[name], [name]                 ACCOUNT NO.:  [id number]   MEDICAL RECORD NO.:  [id number]          PATIENT TYPE:  INP   LOCATION:  5709                         FACILITY:  [name]   PHYSICIAN:  [name], M.D.  DATE OF BIRTH:  04/16/1981   DATE OF ADMISSION:  03/31/2005  DATE OF DISCHARGE:  04/05/2005                                 DISCHARGE SUMMARY   DISCHARGE DIAGNOSES:  1.  Esophagitis.  2.  Haemophilus parainfluenza infection.  3.  Type 1 diabetes mellitus.  4.  Hyperbilirubinemia, (?) Gilbert's syndrome.   MEDICATIONS:  1.  Sliding scale insulin, Lantus 10 units at night.  2.  Protonix 40 mg IV q.24h.  3.  Phenergan 12.5 mg q.6h. IV.  4.  Morphine 2 mg q.4h. IV.  5.  Ciprofloxacin 400 mg IV q.12h.  6.  Metronidazole 500 mg IV q.6h.  7.  Lovenox 40 mEq subcutaneous q.24h.   PROCEDURES THIS ADMISSION:  Endoscopy on April 02, 2005.  Impression - showed  esophagitis.   CONSULTANTS THIS ADMISSION:  1.  Surgery, [place].  2.  GI consult, Dr. Henrene Pastor.   IMAGING THIS ADMISSION:  1.  CT abdomen with IV and rectal contrast on April 01, 2005.  Impression -      abdomen with diffuse mesenteric edema, nonspecific in appearance.  No      associated bowel obstruction or dilatation.  No free air.  No fluid      collection or abscess identified.  (?) fatty infiltration of      liver/perfusion abnormality.  Prominent left chest wall subareolar soft      tissue present.  CT of the pelvis with stable pattern of central      mesenteric abdominal wall edema, nonspecific.  No definite bowel      abnormality in pelvis.  Non-visualization of appendix, and improving      ascites.  2.  Chest x-ray done on April 02, 2005.  No acute disease in chest.  PICC line      tip in superior vena cava.  3.  Orthopantogram (Panorex) done on July, 7, 2006.  Impression - no      evidence of fracture or other focal lesions involving mandible.  Normal      study.   HISTORY OF PRESENTING ILLNESS:   A 30 year old African-American man with a  past medical history of type I diabetes mellitus with multiple admissions  for DKA in the past who presents to the emergency department by EMS for  recurrent episodes of nausea and vomiting since 2-3 weeks.  The patient had  been admitted to [facility] for the same, but he left against medical  advice from there on March 30, 2005.  The patient claims the vomiting is about  5-10 times a day.  Initially, it is clear and watery, not associated with  food particles, but sometimes bile tinged, and after much vomiting, blood  streaked, as well.  Vomiting is intermittent, but increases in the morning  after waking up.  The patient apparently feels feverish; however, the  temperature recording  is around 99.  He also is nauseous.  Vomiting is  associated with intermittent abdominal pain around the umbilicus, rated 7 on  a 10 scale, dragging type, and non-radiating.  The patient refused endoscopy  at [facility]; however, ultrasound of the abdomen showed no  abnormalities.  No history of diarrhea, burning micturition, sick contacts,  or recent travel.   ALLERGIES:  No known drug allergies.   PAST MEDICAL HISTORY:  Diabetes mellitus from age 65.   PHYSICAL EXAMINATION:  VITAL SIGNS:  Pulse 86, blood pressure 148/88,  temperature 99.2, respirations 18, O2 saturation 100% on room air.  GENERAL APPEARANCE:  This is a 30 year old man who appears in distress.  HEENT:  Pupils are equal and reactive.  Extraocular movements are intact.  ENT - oropharynx clear.  Mucous is dry.  NECK:  Supple.  No lymphadenopathy.  LUNGS:  Clear to auscultation bilaterally.  No rhonchi or wheezes.  HEART:  Regular rate and rhythm.  No murmurs, rubs, or gallops.  ABDOMEN:  Soft.  Tenderness present in the umbilical region.  No rebound  tenderness.  No free fluid.  Bowel sounds are present.  EXTREMITIES:  No pedal edema.  SKIN:  Hyperpigmented spots on the upper anterior  chest, and gynecomastia  seen.  NEUROLOGIC:  Alert and oriented x3.  Strength in all extremities 5/5 and  symmetric bilaterally.  Tone is good and symmetric bilaterally.  Cranial  nerves II-XII intact.  Speech normal.  Reflexes are normal, 2+.   LABORATORY DATA:  Sodium 134, potassium 4.0, chloride 101, bicarbonate 23,  BUN 9, creatinine 0.8, and glucose 254.  Hemoglobin 13.8, hematocrit 40.4,  WBC count 11.1, Ancef 8.6, platelets 269.  Amylase 54, TSH 1.104.  Alcohol  level of less than 5.  Urinalysis shows ketones more than 80, glucose more  than 1000, WBC 0-2.  Urine drug screen was positive for marijuana.  Total  bilirubin 4.3, alkaline phosphatase 90, SGOT 14, SGPT 9, protein 5.8,  albuterol 3.5, direct bilirubin 0.4, indirect bilirubin 3.9.  Blood acetone  levels in small amounts.  ABG - the first one revealed a pH of 7.47, PCO2 of  33, PO2 of 86, and bicarbonate 24; the second one revealed a pH of 7.48,  PCO2 of 29, and bicarbonate 21.8.   PROBLEM LIST:  1.  Nausea, vomiting, abdominal pain, diagnosed as having esophagitis by      endoscopy done on April 02, 2005, being treated with Protonix 40 mg b.i.d.      Initially, it was thought that the patient may have acute or chronic      appendicitis; however, on CT, IV, and rectal contrast showed that the      appendix could not be visualized.  However, there is no history of an      appendectomy.  Surgery consult reported no surgical problems.  The      patient was started on metronidazole and Flagyl; however, was      discontinued after diagnosis of esophagitis was made.  2.  Haemophilus parainfluenza infection.  Blood cultures x2 grew Haemophilus      parainfluenza.  The patient was started on      fluoroquinolone/ciprofloxacin, as per infection diseases.  Panorex      (orthopantogram) was obtained and showed no source infection like dental      abscess. 3.  Type 1 diabetes mellitus.  The patient was put on sliding scale insulin.       Glucose levels on  admission were 254.  Poorly controlled diabetes      mellitus.  4.  Hyperbilirubinemia.  Indirect bilirubin was more than direct bilirubin.      Question Rosanna Randy syndrome.  No signs of hemolysis.  5.  Prophylaxis.  Protonix and Lovenox.   On the day of discharge, the patient had no episodes of vomiting.  Pulse was  93, blood pressure 150/90, respirations 20, temperature 97.4.  Abdominal  pain had decreased.   LABORATORY DATA ON DISCHARGE:  Hemoglobin 13, white cell count 9.5,  platelets 293.  Sodium 138, potassium 3.9, chloride 100, BUN 1, creatinine  0.8, glucose 102.   MEDICATIONS ON DISCHARGE:  1.  Protonix 40 mg to be taken by mouth, one pill every 12 hours, 30 minutes      before meals.  2.  Ciprofloxacin 500 mg b.i.d. p.o.  3.  Reglan 5 mg t.i.d. p.o.  4.  NovoLog 70/30, 15 units twice a day subcutaneous.  5.  One multivitamin pill by mouth each day.   The patient normally goes to see [facility] for monitoring of blood  glucose levels.  Will call and set up an appointment with [facility] so  that the patient can go there and do his further blood check up.       SS/MEDQ  D:  04/05/2005  T:  04/05/2005  Job:  [id number]

## 2011-02-13 NOTE — Discharge Summary (Signed)
[name], [name]                             ACCOUNT NO.:  [id number]   MEDICAL RECORD NO.:  [id number]                   PATIENT TYPE:  INP   LOCATION:  0160                                 FACILITY:  [facility]   PHYSICIAN:  Jerelene Redden, MD                   DATE OF BIRTH:  1981-06-20   DATE OF ADMISSION:  04/22/2004  DATE OF DISCHARGE:                                 DISCHARGE SUMMARY   HISTORY:  Shakiel Cozine is a 30 year old man who presented to [facility] on July 26 with 30 year old man who presented to [facility] on July 26 with increased lethargy and weakness.  He was  found to have a blood sugar of 524 with evidence of diabetic ketoacidosis.  The patient is not a very reliable historian but it seems clear from talking  to his family that he is probably taking his insulin intermittently and  probably had a period of time where he did not take insulin.  Past history  of remarkable for diagnosis of diabetes at age 30.  The patient smokes  cigarettes and marijuana occasionally.   FAMILY HISTORY:  Remarkable for the patient's father died at the age of 71  secondary to complications of hypertensive renal disease.   PHYSICAL EXAMINATION:  GENERAL:  He was a somewhat somnolent young man who  was in no respiratory distress.  VITAL SIGNS:  Blood pressure 130/70, pulse 96, respirations 12.  HEENT:  Within normal limits except that mucous membranes were described as  dry.  CHEST:  Clear.  CARDIOVASCULAR:  Tachycardia.  There were no rubs, murmurs, or gallops.  ABDOMEN:  Benign.  There were normal bowel sounds.  There were no masses or  tenderness.  NEUROLOGIC:  Within normal limits.  EXTREMITIES:  No evidence of cyanosis or edema.  There were 2+ dorsalis  pedis and posterior tibial pulses.   LABORATORIES:  Sodium 130, potassium 4.7, chloride 93, CO2 12, creatinine  1.4, BUN 14, glucose 524.  The patient had a pH of 7.18.  EKG revealed sinus  tachycardia.   HOSPITAL COURSE:  The patient was admitted and placed on vigorous  IV  hydration with normal saline.  He received 1 L rapidly and then a second  liter at 200 mL/hour.  Intravenous insulin was initiated per [employer]  protocol.  By July 27 the patient was taking a 2000 calorie ADA diet.  [employer] protocol was discontinued and he was administered Lantus  insulin along with a sliding scale.  By July 28 it seemed reasonable to  discharge the patient.  Clearly, the main source of his problems is lack of  compliance with medical care and failure to follow up with a physician on a  regular basis.  We discussed the importance of following up with the  physicians at [facility].   DISCHARGE MEDICATIONS:  70/30 insulin which will have him take at a dose of  20 units a.c. breakfast  and supper.  He should follow up with physicians at  [facility] in 7-10 days.                                               Jerelene Redden, MD   SY/MEDQ  D:  04/24/2004  T:  04/24/2004  Job:  [id number]   cc:   Karoline Caldwell

## 2011-02-13 NOTE — Discharge Summary (Signed)
[name], [name]                 ACCOUNT NO.:  [id number]   MEDICAL RECORD NO.:  [id number]          PATIENT TYPE:  INP   LOCATION:  [id number]                         [facility]ACILITY:  [facility]   PHYSICIAN:  Felicita Gage, MD     DATE OF BIRTH:  1981-05-03   DATE OF ADMISSION:  03/28/2005  DATE OF DISCHARGE:  03/29/2005                                 DISCHARGE SUMMARY   DISCHARGE DIAGNOSES:  1.  Diabetes mellitus, uncontrolled  2.  Nausea and vomiting.   DISCHARGE MEDICATION:  Continue previous regimen of insulin 70/30 10 units  b.i.d.   FOLLOW-UP PLAN:  Healthcare.   HISTORY OF PRESENT ILLNESS:  [name] is a 30 year old African-American man  with history of type 1 diabetes mellitus since age 85 with recurrent nausea  and vomiting with multiple admissions for this in the past who presented on  March 28, 2005 with recurrent nausea and vomiting for about two weeks almost  every morning and unable to keep food down.  The symptoms worsened the day  before admission and patient was also unable to retain any fluid, had low  grade fever without chills with mild abdominal cramping.  No diarrhea.  He  came to the emergency department to be evaluated.  He denies any cough,  chest pain, shortness of breath.  He experienced one episode of questionable  hematemesis without dizziness.   PAST MEDICAL HISTORY:  1.  Recurrent nausea and vomiting.  2.  Type 1 diabetes mellitus since age 57.   ADMISSION MEDICATION:  Insulin 70/30 10 units b.i.d.   ALLERGIES:  No known drug allergies.   PHYSICAL EXAMINATION:  VITAL SIGNS: Blood pressure of 122/78, pulse 95,  respiratory rate 20, temperature of 99.2, O2 SATs of 100% on room air.  GENERAL: Patient is very ill, looked pale, mildly dehydrated.  HEENT: Normocephalic and atraumatic, pupils equal round and reactive to  light, sclera anicteric, mucous membranes dry, neck supple, no LAD, no JVD.  LUNGS: Clear to auscultation bilaterally, no wheezes, no rales.  CARDIOVASCULAR: S1 plus S2, regular rate and rhythm, no murmurs, rubs or  gallops.  ABDOMEN: Soft, nontender, nondistended, positive bowel sounds, no  hepatosplenomegaly.  EXTREMITIES: No cyanosis, clubbing or edema.  NEUROLOGIC: No focal deficits.   ADMISSION LABORATORY DATA:  WBC of 10.7, hemoglobin 13.8, hematocrit 40.3,  MCV 88.8, platelets 270, neutrophils 90%, lymphocytes nine.  Sodium of 138,  potassium 3.7, chloride 105, CO2 25, glucose 192, BUN 7, creatinine 0.7.  Calcium of 8.9.   HOSPITAL COURSE:  Patient was admitted to the floor.   1.  Diabetes mellitus.  He was placed on sliding scale insulin, Lantus was      started at 10 units daily.   1.  Nausea and vomiting.  Likely gastroparesis from diabetes mellitus      compounded with possible viral infection.  He was started on Zofran 4 mg      IV q.8h.  He was also started on Reglan 10 mg IV q.a.c. and h.s.  He was      on clear diet.  He  was able to tolerate clear diet on hospital day #2.   An endoscopy was offered to the patient to evaluate his recurrent nausea and  vomiting. Patient, however, refused this procedure.   On March 29, 2005 at 8 p.m., the patient elected to leave against medical  advice.  He signed against medical advice papers and left against medical  advice.  Before leaving he was advised to follow up with [facility] for his  diabetes mellitus.       GDK/MEDQ  D:  03/30/2005  T:  03/30/2005  Job:  [id number]

## 2013-05-29 DIAGNOSIS — E871 Hypo-osmolality and hyponatremia: Secondary | ICD-10-CM | POA: Insufficient documentation

## 2013-05-29 DIAGNOSIS — E872 Acidosis, unspecified: Secondary | ICD-10-CM | POA: Insufficient documentation

## 2013-05-31 DIAGNOSIS — R7881 Bacteremia: Secondary | ICD-10-CM

## 2013-05-31 DIAGNOSIS — D72829 Elevated white blood cell count, unspecified: Secondary | ICD-10-CM | POA: Insufficient documentation

## 2013-05-31 DIAGNOSIS — B9561 Methicillin susceptible Staphylococcus aureus infection as the cause of diseases classified elsewhere: Secondary | ICD-10-CM | POA: Insufficient documentation

## 2014-03-19 DIAGNOSIS — R1084 Generalized abdominal pain: Secondary | ICD-10-CM | POA: Insufficient documentation

## 2014-03-19 DIAGNOSIS — Z992 Dependence on renal dialysis: Secondary | ICD-10-CM

## 2014-03-19 DIAGNOSIS — N186 End stage renal disease: Secondary | ICD-10-CM | POA: Insufficient documentation

## 2014-06-18 ENCOUNTER — Inpatient Hospital Stay: Payer: Self-pay | Admitting: Internal Medicine

## 2014-06-18 LAB — CBC
HCT: 37.3 % — ABNORMAL LOW (ref 40.0–52.0)
HGB: 11.8 g/dL — ABNORMAL LOW (ref 13.0–18.0)
MCH: 30.1 pg (ref 26.0–34.0)
MCHC: 31.7 g/dL — ABNORMAL LOW (ref 32.0–36.0)
MCV: 95 fL (ref 80–100)
Platelet: 161 10*3/uL (ref 150–440)
RBC: 3.93 10*6/uL — ABNORMAL LOW (ref 4.40–5.90)
RDW: 14.6 % — ABNORMAL HIGH (ref 11.5–14.5)
WBC: 14.2 10*3/uL — ABNORMAL HIGH (ref 3.8–10.6)

## 2014-06-18 LAB — COMPREHENSIVE METABOLIC PANEL
Albumin: 4.1 g/dL (ref 3.4–5.0)
Alkaline Phosphatase: 140 U/L — ABNORMAL HIGH
Anion Gap: 13 (ref 7–16)
BUN: 48 mg/dL — ABNORMAL HIGH (ref 7–18)
Bilirubin,Total: 0.7 mg/dL (ref 0.2–1.0)
Calcium, Total: 9.2 mg/dL (ref 8.5–10.1)
Chloride: 94 mmol/L — ABNORMAL LOW (ref 98–107)
Co2: 24 mmol/L (ref 21–32)
Creatinine: 9.72 mg/dL — ABNORMAL HIGH (ref 0.60–1.30)
EGFR (African American): 7 — ABNORMAL LOW
EGFR (Non-African Amer.): 6 — ABNORMAL LOW
Glucose: 436 mg/dL — ABNORMAL HIGH (ref 65–99)
Osmolality: 294 (ref 275–301)
Potassium: 4.5 mmol/L (ref 3.5–5.1)
SGOT(AST): 17 U/L (ref 15–37)
SGPT (ALT): 11 U/L — ABNORMAL LOW
Sodium: 131 mmol/L — ABNORMAL LOW (ref 136–145)
Total Protein: 8 g/dL (ref 6.4–8.2)

## 2014-06-18 LAB — LIPASE, BLOOD: Lipase: 137 U/L (ref 73–393)

## 2014-06-18 LAB — TROPONIN I: Troponin-I: <0.02 ng/mL

## 2014-06-19 LAB — CBC WITH DIFFERENTIAL/PLATELET
Basophil #: 0.1 10*3/uL (ref 0.0–0.1)
Basophil %: 0.9 %
Eosinophil #: 0 10*3/uL (ref 0.0–0.7)
Eosinophil %: 0.1 %
HCT: 36.3 % — ABNORMAL LOW (ref 40.0–52.0)
HGB: 11.6 g/dL — ABNORMAL LOW (ref 13.0–18.0)
Lymphocyte #: 0.9 10*3/uL — ABNORMAL LOW (ref 1.0–3.6)
Lymphocyte %: 6.4 %
MCH: 30.9 pg (ref 26.0–34.0)
MCHC: 31.9 g/dL — ABNORMAL LOW (ref 32.0–36.0)
MCV: 97 fL (ref 80–100)
Monocyte #: 0.5 x10 3/mm (ref 0.2–1.0)
Monocyte %: 3.7 %
Neutrophil #: 12.7 10*3/uL — ABNORMAL HIGH (ref 1.4–6.5)
Neutrophil %: 88.9 %
Platelet: 152 10*3/uL (ref 150–440)
RBC: 3.74 10*6/uL — ABNORMAL LOW (ref 4.40–5.90)
RDW: 14.9 % — ABNORMAL HIGH (ref 11.5–14.5)
WBC: 14.3 10*3/uL — ABNORMAL HIGH (ref 3.8–10.6)

## 2014-06-19 LAB — BASIC METABOLIC PANEL
Anion Gap: 22 — ABNORMAL HIGH (ref 7–16)
BUN: 67 mg/dL — ABNORMAL HIGH (ref 7–18)
Calcium, Total: 8.2 mg/dL — ABNORMAL LOW (ref 8.5–10.1)
Chloride: 92 mmol/L — ABNORMAL LOW (ref 98–107)
Co2: 15 mmol/L — ABNORMAL LOW (ref 21–32)
Creatinine: 10.96 mg/dL — ABNORMAL HIGH (ref 0.60–1.30)
EGFR (African American): 6 — ABNORMAL LOW
EGFR (Non-African Amer.): 5 — ABNORMAL LOW
Glucose: 571 mg/dL (ref 65–99)
Osmolality: 305 (ref 275–301)
Potassium: 5.8 mmol/L — ABNORMAL HIGH (ref 3.5–5.1)
Sodium: 129 mmol/L — ABNORMAL LOW (ref 136–145)

## 2014-06-19 LAB — PHOSPHORUS: Phosphorus: 6.7 mg/dL — ABNORMAL HIGH (ref 2.5–4.9)

## 2014-06-20 LAB — PHOSPHORUS: Phosphorus: 4.5 mg/dL (ref 2.5–4.9)

## 2014-06-21 ENCOUNTER — Emergency Department: Payer: Self-pay | Admitting: Emergency Medicine

## 2014-06-21 LAB — COMPREHENSIVE METABOLIC PANEL
Albumin: 3.5 g/dL (ref 3.4–5.0)
Alkaline Phosphatase: 121 U/L — ABNORMAL HIGH
Anion Gap: 12 (ref 7–16)
BUN: 14 mg/dL (ref 7–18)
Bilirubin,Total: 1.1 mg/dL — ABNORMAL HIGH (ref 0.2–1.0)
Calcium, Total: 8.1 mg/dL — ABNORMAL LOW (ref 8.5–10.1)
Chloride: 98 mmol/L (ref 98–107)
Co2: 26 mmol/L (ref 21–32)
Creatinine: 4.34 mg/dL — ABNORMAL HIGH (ref 0.60–1.30)
EGFR (African American): 20 — ABNORMAL LOW
EGFR (Non-African Amer.): 17 — ABNORMAL LOW
Glucose: 169 mg/dL — ABNORMAL HIGH (ref 65–99)
Osmolality: 276 (ref 275–301)
Potassium: 3.5 mmol/L (ref 3.5–5.1)
SGOT(AST): 42 U/L — ABNORMAL HIGH (ref 15–37)
SGPT (ALT): 30 U/L
Sodium: 136 mmol/L (ref 136–145)
Total Protein: 7.5 g/dL (ref 6.4–8.2)

## 2014-06-21 LAB — CBC
HCT: 36.3 % — ABNORMAL LOW (ref 40.0–52.0)
HGB: 12 g/dL — ABNORMAL LOW (ref 13.0–18.0)
MCH: 30.6 pg (ref 26.0–34.0)
MCHC: 33.2 g/dL (ref 32.0–36.0)
MCV: 92 fL (ref 80–100)
Platelet: 144 10*3/uL — ABNORMAL LOW (ref 150–440)
RBC: 3.94 10*6/uL — ABNORMAL LOW (ref 4.40–5.90)
RDW: 14.2 % (ref 11.5–14.5)
WBC: 8.6 10*3/uL (ref 3.8–10.6)

## 2014-06-21 LAB — CEA: CEA: 3.4 ng/mL (ref 0.0–4.7)

## 2014-06-21 LAB — LIPASE, BLOOD: Lipase: 73 U/L (ref 73–393)

## 2014-06-21 LAB — AFP TUMOR MARKER: AFP-Tumor Marker: 2.8 ng/mL (ref 0.0–8.3)

## 2014-06-24 LAB — CULTURE, BLOOD (SINGLE)

## 2014-06-28 LAB — CBC WITH DIFFERENTIAL/PLATELET
Basophil #: 0.1 10*3/uL (ref 0.0–0.1)
Basophil %: 0.7 %
Eosinophil #: 0.1 10*3/uL (ref 0.0–0.7)
Eosinophil %: 0.6 %
HCT: 35.9 % — ABNORMAL LOW (ref 40.0–52.0)
HGB: 11.7 g/dL — ABNORMAL LOW (ref 13.0–18.0)
Lymphocyte #: 1.1 10*3/uL (ref 1.0–3.6)
Lymphocyte %: 10.4 %
MCH: 30.6 pg (ref 26.0–34.0)
MCHC: 32.7 g/dL (ref 32.0–36.0)
MCV: 94 fL (ref 80–100)
Monocyte #: 0.3 x10 3/mm (ref 0.2–1.0)
Monocyte %: 3.1 %
Neutrophil #: 9.3 10*3/uL — ABNORMAL HIGH (ref 1.4–6.5)
Neutrophil %: 85.2 %
Platelet: 263 10*3/uL (ref 150–440)
RBC: 3.84 10*6/uL — ABNORMAL LOW (ref 4.40–5.90)
RDW: 14.1 % (ref 11.5–14.5)
WBC: 10.9 10*3/uL — ABNORMAL HIGH (ref 3.8–10.6)

## 2014-06-28 LAB — COMPREHENSIVE METABOLIC PANEL
Albumin: 3.9 g/dL (ref 3.4–5.0)
Alkaline Phosphatase: 130 U/L — ABNORMAL HIGH
Anion Gap: 19 — ABNORMAL HIGH (ref 7–16)
BUN: 25 mg/dL — ABNORMAL HIGH (ref 7–18)
Bilirubin,Total: 0.7 mg/dL (ref 0.2–1.0)
Calcium, Total: 8.7 mg/dL (ref 8.5–10.1)
Chloride: 97 mmol/L — ABNORMAL LOW (ref 98–107)
Co2: 19 mmol/L — ABNORMAL LOW (ref 21–32)
Creatinine: 5.25 mg/dL — ABNORMAL HIGH (ref 0.60–1.30)
EGFR (African American): 16 — ABNORMAL LOW
EGFR (Non-African Amer.): 13 — ABNORMAL LOW
Glucose: 303 mg/dL — ABNORMAL HIGH (ref 65–99)
Osmolality: 286 (ref 275–301)
Potassium: 4.1 mmol/L (ref 3.5–5.1)
SGOT(AST): 23 U/L (ref 15–37)
SGPT (ALT): 18 U/L
Sodium: 135 mmol/L — ABNORMAL LOW (ref 136–145)
Total Protein: 8 g/dL (ref 6.4–8.2)

## 2014-06-28 LAB — LIPASE, BLOOD: Lipase: 93 U/L (ref 73–393)

## 2014-06-28 LAB — TROPONIN I: Troponin-I: <0.02 ng/mL

## 2014-06-28 LAB — TSH: Thyroid Stimulating Horm: 1.67 u[IU]/mL

## 2014-06-29 ENCOUNTER — Inpatient Hospital Stay: Payer: Self-pay | Admitting: Internal Medicine

## 2014-06-29 LAB — URINALYSIS, COMPLETE
Bilirubin,UR: NEGATIVE
Glucose,UR: >=500 mg/dL (ref 0–75)
Leukocyte Esterase: NEGATIVE
Nitrite: NEGATIVE
Ph: 6 (ref 4.5–8.0)
Protein: >=500
RBC,UR: 2 /HPF (ref 0–5)
Specific Gravity: 1.012 (ref 1.003–1.030)
Squamous Epithelial: NONE SEEN
WBC UR: 7 /HPF (ref 0–5)

## 2014-06-29 LAB — BASIC METABOLIC PANEL
Anion Gap: 11 (ref 7–16)
BUN: 46 mg/dL — ABNORMAL HIGH (ref 7–18)
Calcium, Total: 8.4 mg/dL — ABNORMAL LOW (ref 8.5–10.1)
Chloride: 107 mmol/L (ref 98–107)
Co2: 20 mmol/L — ABNORMAL LOW (ref 21–32)
Creatinine: 6.69 mg/dL — ABNORMAL HIGH (ref 0.60–1.30)
EGFR (African American): 12 — ABNORMAL LOW
EGFR (Non-African Amer.): 10 — ABNORMAL LOW
Glucose: 67 mg/dL (ref 65–99)
Osmolality: 286 (ref 275–301)
Potassium: 4 mmol/L (ref 3.5–5.1)
Sodium: 138 mmol/L (ref 136–145)

## 2014-06-29 LAB — MAGNESIUM: Magnesium: 2.4 mg/dL

## 2014-06-29 LAB — BETA-HYDROXYBUTYRIC ACID: Beta-Hydroxybutyrate: >46 mg/dL (ref 0.2–2.8)

## 2014-06-29 LAB — PHOSPHORUS: Phosphorus: 1.7 mg/dL — ABNORMAL LOW (ref 2.5–4.9)

## 2014-06-29 LAB — HEMOGLOBIN A1C: Hemoglobin A1C: 7.1 % — ABNORMAL HIGH (ref 4.2–6.3)

## 2014-06-30 LAB — BASIC METABOLIC PANEL
Anion Gap: 13 (ref 7–16)
BUN: 48 mg/dL — ABNORMAL HIGH (ref 7–18)
Calcium, Total: 8.4 mg/dL — ABNORMAL LOW (ref 8.5–10.1)
Chloride: 100 mmol/L (ref 98–107)
Co2: 21 mmol/L (ref 21–32)
Creatinine: 7.7 mg/dL — ABNORMAL HIGH (ref 0.60–1.30)
EGFR (African American): 10 — ABNORMAL LOW
EGFR (Non-African Amer.): 9 — ABNORMAL LOW
Glucose: 237 mg/dL — ABNORMAL HIGH (ref 65–99)
Osmolality: 289 (ref 275–301)
Potassium: 4 mmol/L (ref 3.5–5.1)
Sodium: 134 mmol/L — ABNORMAL LOW (ref 136–145)

## 2014-06-30 LAB — CBC WITH DIFFERENTIAL/PLATELET
Basophil #: 0 10*3/uL (ref 0.0–0.1)
Basophil %: 0.3 %
Eosinophil #: 0.1 10*3/uL (ref 0.0–0.7)
Eosinophil %: 0.8 %
HCT: 32.3 % — ABNORMAL LOW (ref 40.0–52.0)
HGB: 10.5 g/dL — ABNORMAL LOW (ref 13.0–18.0)
Lymphocyte #: 2.3 10*3/uL (ref 1.0–3.6)
Lymphocyte %: 17.2 %
MCH: 31 pg (ref 26.0–34.0)
MCHC: 32.4 g/dL (ref 32.0–36.0)
MCV: 96 fL (ref 80–100)
Monocyte #: 1.1 x10 3/mm — ABNORMAL HIGH (ref 0.2–1.0)
Monocyte %: 8.5 %
Neutrophil #: 9.8 10*3/uL — ABNORMAL HIGH (ref 1.4–6.5)
Neutrophil %: 73.2 %
Platelet: 225 10*3/uL (ref 150–440)
RBC: 3.38 10*6/uL — ABNORMAL LOW (ref 4.40–5.90)
RDW: 14.4 % (ref 11.5–14.5)
WBC: 13.3 10*3/uL — ABNORMAL HIGH (ref 3.8–10.6)

## 2014-07-27 ENCOUNTER — Inpatient Hospital Stay: Payer: Self-pay | Admitting: Internal Medicine

## 2014-07-27 LAB — LIPASE, BLOOD: Lipase: 347 U/L (ref 73–393)

## 2014-07-27 LAB — CBC
HCT: 33.3 % — ABNORMAL LOW (ref 40.0–52.0)
HGB: 10.4 g/dL — ABNORMAL LOW (ref 13.0–18.0)
MCH: 31.3 pg (ref 26.0–34.0)
MCHC: 31.4 g/dL — ABNORMAL LOW (ref 32.0–36.0)
MCV: 100 fL (ref 80–100)
Platelet: 165 10*3/uL (ref 150–440)
RBC: 3.34 10*6/uL — ABNORMAL LOW (ref 4.40–5.90)
RDW: 16.5 % — ABNORMAL HIGH (ref 11.5–14.5)
WBC: 18.4 10*3/uL — ABNORMAL HIGH (ref 3.8–10.6)

## 2014-07-27 LAB — COMPREHENSIVE METABOLIC PANEL
Albumin: 4 g/dL (ref 3.4–5.0)
Alkaline Phosphatase: 148 U/L — ABNORMAL HIGH
BUN: 55 mg/dL — ABNORMAL HIGH (ref 7–18)
Bilirubin,Total: 0.8 mg/dL (ref 0.2–1.0)
Calcium, Total: 8.4 mg/dL — ABNORMAL LOW (ref 8.5–10.1)
Chloride: 96 mmol/L — ABNORMAL LOW (ref 98–107)
Co2: 17 mmol/L — ABNORMAL LOW (ref 21–32)
Creatinine: 7.45 mg/dL — ABNORMAL HIGH (ref 0.60–1.30)
EGFR (African American): 11 — ABNORMAL LOW
EGFR (Non-African Amer.): 9 — ABNORMAL LOW
Glucose: 597 mg/dL (ref 65–99)
Potassium: 5 mmol/L (ref 3.5–5.1)
SGOT(AST): 26 U/L (ref 15–37)
SGPT (ALT): 18 U/L
Sodium: 134 mmol/L — ABNORMAL LOW (ref 136–145)
Total Protein: 7.6 g/dL (ref 6.4–8.2)

## 2014-07-27 LAB — BETA-HYDROXYBUTYRIC ACID: Beta-Hydroxybutyrate: >46 mg/dL (ref 0.2–2.8)

## 2014-07-28 LAB — BASIC METABOLIC PANEL
Anion Gap: 24 — ABNORMAL HIGH (ref 7–16)
Anion Gap: 15 (ref 7–16)
BUN: 66 mg/dL — ABNORMAL HIGH (ref 7–18)
BUN: 65 mg/dL — ABNORMAL HIGH (ref 7–18)
Calcium, Total: 8.3 mg/dL — ABNORMAL LOW (ref 8.5–10.1)
Calcium, Total: 8.1 mg/dL — ABNORMAL LOW (ref 8.5–10.1)
Chloride: 97 mmol/L — ABNORMAL LOW (ref 98–107)
Chloride: 99 mmol/L (ref 98–107)
Co2: 16 mmol/L — ABNORMAL LOW (ref 21–32)
Co2: 23 mmol/L (ref 21–32)
Creatinine: 8.31 mg/dL — ABNORMAL HIGH (ref 0.60–1.30)
Creatinine: 8.35 mg/dL — ABNORMAL HIGH (ref 0.60–1.30)
EGFR (African American): 10 — ABNORMAL LOW
EGFR (African American): 10 — ABNORMAL LOW
EGFR (Non-African Amer.): 8 — ABNORMAL LOW
EGFR (Non-African Amer.): 8 — ABNORMAL LOW
Glucose: 518 mg/dL (ref 65–99)
Glucose: 203 mg/dL — ABNORMAL HIGH (ref 65–99)
Osmolality: 316 (ref 275–301)
Osmolality: 298 (ref 275–301)
Potassium: 4.1 mmol/L (ref 3.5–5.1)
Potassium: 3.5 mmol/L (ref 3.5–5.1)
Sodium: 137 mmol/L (ref 136–145)
Sodium: 137 mmol/L (ref 136–145)

## 2014-07-28 LAB — PHOSPHORUS: Phosphorus: 3.5 mg/dL (ref 2.5–4.9)

## 2014-07-30 LAB — URINALYSIS, COMPLETE
Bilirubin,UR: NEGATIVE
Glucose,UR: >=500 mg/dL (ref 0–75)
Leukocyte Esterase: NEGATIVE
Nitrite: NEGATIVE
Ph: 5 (ref 4.5–8.0)
Protein: >=500
RBC,UR: 1 /HPF (ref 0–5)
Specific Gravity: 1.012 (ref 1.003–1.030)
Squamous Epithelial: 1
WBC UR: 5 /HPF (ref 0–5)

## 2014-07-30 LAB — DRUG SCREEN, URINE
Amphetamines, Ur Screen: NEGATIVE (ref ?–1000)
Barbiturates, Ur Screen: NEGATIVE (ref ?–200)
Benzodiazepine, Ur Scrn: NEGATIVE (ref ?–200)
Cannabinoid 50 Ng, Ur ~~LOC~~: POSITIVE (ref ?–50)
Cocaine Metabolite,Ur ~~LOC~~: NEGATIVE (ref ?–300)
MDMA (Ecstasy)Ur Screen: NEGATIVE (ref ?–500)
Methadone, Ur Screen: NEGATIVE (ref ?–300)
Opiate, Ur Screen: POSITIVE (ref ?–300)
Phencyclidine (PCP) Ur S: NEGATIVE (ref ?–25)
Tricyclic, Ur Screen: NEGATIVE (ref ?–1000)

## 2014-07-31 LAB — CBC WITH DIFFERENTIAL/PLATELET
Basophil #: 0 10*3/uL (ref 0.0–0.1)
Basophil %: 0.2 %
Eosinophil #: 0.3 10*3/uL (ref 0.0–0.7)
Eosinophil %: 3.4 %
HCT: 30.1 % — ABNORMAL LOW (ref 40.0–52.0)
HGB: 9.7 g/dL — ABNORMAL LOW (ref 13.0–18.0)
Lymphocyte #: 1.5 10*3/uL (ref 1.0–3.6)
Lymphocyte %: 15.7 %
MCH: 31.6 pg (ref 26.0–34.0)
MCHC: 32.4 g/dL (ref 32.0–36.0)
MCV: 98 fL (ref 80–100)
Monocyte #: 0.9 x10 3/mm (ref 0.2–1.0)
Monocyte %: 9.4 %
Neutrophil #: 6.6 10*3/uL — ABNORMAL HIGH (ref 1.4–6.5)
Neutrophil %: 71.3 %
Platelet: 178 10*3/uL (ref 150–440)
RBC: 3.08 10*6/uL — ABNORMAL LOW (ref 4.40–5.90)
RDW: 16 % — ABNORMAL HIGH (ref 11.5–14.5)
WBC: 9.3 10*3/uL (ref 3.8–10.6)

## 2014-07-31 LAB — RENAL FUNCTION PANEL
Albumin: 3.1 g/dL — ABNORMAL LOW (ref 3.4–5.0)
Anion Gap: 14 (ref 7–16)
BUN: 48 mg/dL — ABNORMAL HIGH (ref 7–18)
Calcium, Total: 7.7 mg/dL — ABNORMAL LOW (ref 8.5–10.1)
Chloride: 94 mmol/L — ABNORMAL LOW (ref 98–107)
Co2: 25 mmol/L (ref 21–32)
Creatinine: 9.57 mg/dL — ABNORMAL HIGH (ref 0.60–1.30)
EGFR (African American): 8 — ABNORMAL LOW
EGFR (Non-African Amer.): 7 — ABNORMAL LOW
Glucose: 178 mg/dL — ABNORMAL HIGH (ref 65–99)
Osmolality: 283 (ref 275–301)
Phosphorus: 3.7 mg/dL (ref 2.5–4.9)
Potassium: 3.5 mmol/L (ref 3.5–5.1)
Sodium: 133 mmol/L — ABNORMAL LOW (ref 136–145)

## 2014-08-09 DIAGNOSIS — I10 Essential (primary) hypertension: Secondary | ICD-10-CM | POA: Insufficient documentation

## 2014-08-19 ENCOUNTER — Inpatient Hospital Stay: Payer: Self-pay | Admitting: Internal Medicine

## 2014-08-19 LAB — COMPREHENSIVE METABOLIC PANEL
Albumin: 3.8 g/dL (ref 3.4–5.0)
Alkaline Phosphatase: 121 U/L — ABNORMAL HIGH
Anion Gap: 18 — ABNORMAL HIGH (ref 7–16)
BUN: 20 mg/dL — ABNORMAL HIGH (ref 7–18)
Bilirubin,Total: 0.9 mg/dL (ref 0.2–1.0)
Calcium, Total: 9 mg/dL (ref 8.5–10.1)
Chloride: 92 mmol/L — ABNORMAL LOW (ref 98–107)
Co2: 20 mmol/L — ABNORMAL LOW (ref 21–32)
Creatinine: 5.83 mg/dL — ABNORMAL HIGH (ref 0.60–1.30)
EGFR (African American): 14 — ABNORMAL LOW
EGFR (Non-African Amer.): 12 — ABNORMAL LOW
Glucose: 426 mg/dL — ABNORMAL HIGH (ref 65–99)
Osmolality: 282 (ref 275–301)
Potassium: 4 mmol/L (ref 3.5–5.1)
SGOT(AST): 14 U/L — ABNORMAL LOW (ref 15–37)
SGPT (ALT): 20 U/L
Sodium: 130 mmol/L — ABNORMAL LOW (ref 136–145)
Total Protein: 7.2 g/dL (ref 6.4–8.2)

## 2014-08-19 LAB — CBC WITH DIFFERENTIAL/PLATELET
Basophil #: 0.1 10*3/uL (ref 0.0–0.1)
Basophil %: 1 %
Eosinophil #: 0.1 10*3/uL (ref 0.0–0.7)
Eosinophil %: 1.2 %
HCT: 33.4 % — ABNORMAL LOW (ref 40.0–52.0)
HGB: 10.6 g/dL — ABNORMAL LOW (ref 13.0–18.0)
Lymphocyte #: 1.3 10*3/uL (ref 1.0–3.6)
Lymphocyte %: 10.4 %
MCH: 31.8 pg (ref 26.0–34.0)
MCHC: 31.9 g/dL — ABNORMAL LOW (ref 32.0–36.0)
MCV: 100 fL (ref 80–100)
Monocyte #: 0.5 x10 3/mm (ref 0.2–1.0)
Monocyte %: 4.4 %
Neutrophil #: 10.2 10*3/uL — ABNORMAL HIGH (ref 1.4–6.5)
Neutrophil %: 83 %
Platelet: 190 10*3/uL (ref 150–440)
RBC: 3.35 10*6/uL — ABNORMAL LOW (ref 4.40–5.90)
RDW: 16 % — ABNORMAL HIGH (ref 11.5–14.5)
WBC: 12.2 10*3/uL — ABNORMAL HIGH (ref 3.8–10.6)

## 2014-08-19 LAB — MAGNESIUM: Magnesium: 2.1 mg/dL

## 2014-08-19 LAB — LIPASE, BLOOD: Lipase: 42 U/L — ABNORMAL LOW (ref 73–393)

## 2014-08-19 LAB — BETA-HYDROXYBUTYRIC ACID: Beta-Hydroxybutyrate: >46 mg/dL (ref 0.2–2.8)

## 2014-08-20 LAB — CBC WITH DIFFERENTIAL/PLATELET
Basophil #: 0.1 10*3/uL (ref 0.0–0.1)
Basophil %: 0.8 %
Eosinophil #: 0.1 10*3/uL (ref 0.0–0.7)
Eosinophil %: 0.8 %
HCT: 31.1 % — ABNORMAL LOW (ref 40.0–52.0)
HGB: 10.6 g/dL — ABNORMAL LOW (ref 13.0–18.0)
Lymphocyte #: 1.6 10*3/uL (ref 1.0–3.6)
Lymphocyte %: 11.1 %
MCH: 32.3 pg (ref 26.0–34.0)
MCHC: 33.9 g/dL (ref 32.0–36.0)
MCV: 95 fL (ref 80–100)
Monocyte #: 1.8 x10 3/mm — ABNORMAL HIGH (ref 0.2–1.0)
Monocyte %: 12.4 %
Neutrophil #: 10.7 10*3/uL — ABNORMAL HIGH (ref 1.4–6.5)
Neutrophil %: 74.9 %
Platelet: 219 10*3/uL (ref 150–440)
RBC: 3.27 10*6/uL — ABNORMAL LOW (ref 4.40–5.90)
RDW: 15.6 % — ABNORMAL HIGH (ref 11.5–14.5)
WBC: 14.3 10*3/uL — ABNORMAL HIGH (ref 3.8–10.6)

## 2014-08-20 LAB — BASIC METABOLIC PANEL
Anion Gap: 20 — ABNORMAL HIGH (ref 7–16)
Anion Gap: 13 (ref 7–16)
BUN: 24 mg/dL — ABNORMAL HIGH (ref 7–18)
BUN: 27 mg/dL — ABNORMAL HIGH (ref 7–18)
Calcium, Total: 8 mg/dL — ABNORMAL LOW (ref 8.5–10.1)
Calcium, Total: 8.5 mg/dL (ref 8.5–10.1)
Chloride: 96 mmol/L — ABNORMAL LOW (ref 98–107)
Chloride: 103 mmol/L (ref 98–107)
Co2: 19 mmol/L — ABNORMAL LOW (ref 21–32)
Co2: 25 mmol/L (ref 21–32)
Creatinine: 6.09 mg/dL — ABNORMAL HIGH (ref 0.60–1.30)
Creatinine: 6.55 mg/dL — ABNORMAL HIGH (ref 0.60–1.30)
EGFR (African American): 14 — ABNORMAL LOW
EGFR (African American): 13 — ABNORMAL LOW
EGFR (Non-African Amer.): 11 — ABNORMAL LOW
EGFR (Non-African Amer.): 10 — ABNORMAL LOW
Glucose: 367 mg/dL — ABNORMAL HIGH (ref 65–99)
Glucose: 56 mg/dL — ABNORMAL LOW (ref 65–99)
Osmolality: 289 (ref 275–301)
Osmolality: 284 (ref 275–301)
Potassium: 3.5 mmol/L (ref 3.5–5.1)
Potassium: 4.2 mmol/L (ref 3.5–5.1)
Sodium: 135 mmol/L — ABNORMAL LOW (ref 136–145)
Sodium: 141 mmol/L (ref 136–145)

## 2014-08-20 LAB — MAGNESIUM: Magnesium: 2.3 mg/dL

## 2014-08-22 LAB — BASIC METABOLIC PANEL
Anion Gap: 8 (ref 7–16)
BUN: 14 mg/dL (ref 7–18)
Calcium, Total: 8.2 mg/dL — ABNORMAL LOW (ref 8.5–10.1)
Chloride: 100 mmol/L (ref 98–107)
Co2: 30 mmol/L (ref 21–32)
Creatinine: 5 mg/dL — ABNORMAL HIGH (ref 0.60–1.30)
EGFR (African American): 17 — ABNORMAL LOW
EGFR (Non-African Amer.): 14 — ABNORMAL LOW
Glucose: 88 mg/dL (ref 65–99)
Osmolality: 276 (ref 275–301)
Potassium: 4 mmol/L (ref 3.5–5.1)
Sodium: 138 mmol/L (ref 136–145)

## 2014-08-22 LAB — CBC WITH DIFFERENTIAL/PLATELET
Basophil #: 0.1 10*3/uL (ref 0.0–0.1)
Basophil %: 1.4 %
Eosinophil #: 0.4 10*3/uL (ref 0.0–0.7)
Eosinophil %: 5.4 %
HCT: 30 % — ABNORMAL LOW (ref 40.0–52.0)
HGB: 9.7 g/dL — ABNORMAL LOW (ref 13.0–18.0)
Lymphocyte #: 1.7 10*3/uL (ref 1.0–3.6)
Lymphocyte %: 23.4 %
MCH: 31.4 pg (ref 26.0–34.0)
MCHC: 32.3 g/dL (ref 32.0–36.0)
MCV: 97 fL (ref 80–100)
Monocyte #: 0.8 x10 3/mm (ref 0.2–1.0)
Monocyte %: 11.4 %
Neutrophil #: 4.2 10*3/uL (ref 1.4–6.5)
Neutrophil %: 58.4 %
Platelet: 158 10*3/uL (ref 150–440)
RBC: 3.09 10*6/uL — ABNORMAL LOW (ref 4.40–5.90)
RDW: 15.3 % — ABNORMAL HIGH (ref 11.5–14.5)
WBC: 7.2 10*3/uL (ref 3.8–10.6)

## 2014-08-22 LAB — PHOSPHORUS: Phosphorus: 3.6 mg/dL (ref 2.5–4.9)

## 2014-08-22 LAB — HEMOGLOBIN A1C: Hemoglobin A1C: 5.8 % (ref 4.2–6.3)

## 2014-09-30 DIAGNOSIS — N186 End stage renal disease: Secondary | ICD-10-CM | POA: Diagnosis not present

## 2014-09-30 DIAGNOSIS — E1129 Type 2 diabetes mellitus with other diabetic kidney complication: Secondary | ICD-10-CM | POA: Diagnosis not present

## 2014-09-30 DIAGNOSIS — D509 Iron deficiency anemia, unspecified: Secondary | ICD-10-CM | POA: Diagnosis not present

## 2014-09-30 DIAGNOSIS — N2581 Secondary hyperparathyroidism of renal origin: Secondary | ICD-10-CM | POA: Diagnosis not present

## 2014-10-02 DIAGNOSIS — N2581 Secondary hyperparathyroidism of renal origin: Secondary | ICD-10-CM | POA: Diagnosis not present

## 2014-10-02 DIAGNOSIS — N186 End stage renal disease: Secondary | ICD-10-CM | POA: Diagnosis not present

## 2014-10-02 DIAGNOSIS — E1129 Type 2 diabetes mellitus with other diabetic kidney complication: Secondary | ICD-10-CM | POA: Diagnosis not present

## 2014-10-02 DIAGNOSIS — D509 Iron deficiency anemia, unspecified: Secondary | ICD-10-CM | POA: Diagnosis not present

## 2014-10-04 DIAGNOSIS — N2581 Secondary hyperparathyroidism of renal origin: Secondary | ICD-10-CM | POA: Diagnosis not present

## 2014-10-04 DIAGNOSIS — D509 Iron deficiency anemia, unspecified: Secondary | ICD-10-CM | POA: Diagnosis not present

## 2014-10-04 DIAGNOSIS — E1129 Type 2 diabetes mellitus with other diabetic kidney complication: Secondary | ICD-10-CM | POA: Diagnosis not present

## 2014-10-04 DIAGNOSIS — N186 End stage renal disease: Secondary | ICD-10-CM | POA: Diagnosis not present

## 2014-10-06 DIAGNOSIS — N186 End stage renal disease: Secondary | ICD-10-CM | POA: Diagnosis not present

## 2014-10-06 DIAGNOSIS — N2581 Secondary hyperparathyroidism of renal origin: Secondary | ICD-10-CM | POA: Diagnosis not present

## 2014-10-06 DIAGNOSIS — D509 Iron deficiency anemia, unspecified: Secondary | ICD-10-CM | POA: Diagnosis not present

## 2014-10-06 DIAGNOSIS — E1129 Type 2 diabetes mellitus with other diabetic kidney complication: Secondary | ICD-10-CM | POA: Diagnosis not present

## 2014-10-09 DIAGNOSIS — N186 End stage renal disease: Secondary | ICD-10-CM | POA: Diagnosis not present

## 2014-10-09 DIAGNOSIS — E1129 Type 2 diabetes mellitus with other diabetic kidney complication: Secondary | ICD-10-CM | POA: Diagnosis not present

## 2014-10-09 DIAGNOSIS — D509 Iron deficiency anemia, unspecified: Secondary | ICD-10-CM | POA: Diagnosis not present

## 2014-10-09 DIAGNOSIS — N2581 Secondary hyperparathyroidism of renal origin: Secondary | ICD-10-CM | POA: Diagnosis not present

## 2014-10-11 DIAGNOSIS — N2581 Secondary hyperparathyroidism of renal origin: Secondary | ICD-10-CM | POA: Diagnosis not present

## 2014-10-11 DIAGNOSIS — E1129 Type 2 diabetes mellitus with other diabetic kidney complication: Secondary | ICD-10-CM | POA: Diagnosis not present

## 2014-10-11 DIAGNOSIS — N186 End stage renal disease: Secondary | ICD-10-CM | POA: Diagnosis not present

## 2014-10-11 DIAGNOSIS — D509 Iron deficiency anemia, unspecified: Secondary | ICD-10-CM | POA: Diagnosis not present

## 2014-10-13 DIAGNOSIS — N2581 Secondary hyperparathyroidism of renal origin: Secondary | ICD-10-CM | POA: Diagnosis not present

## 2014-10-13 DIAGNOSIS — D509 Iron deficiency anemia, unspecified: Secondary | ICD-10-CM | POA: Diagnosis not present

## 2014-10-13 DIAGNOSIS — E1129 Type 2 diabetes mellitus with other diabetic kidney complication: Secondary | ICD-10-CM | POA: Diagnosis not present

## 2014-10-13 DIAGNOSIS — N186 End stage renal disease: Secondary | ICD-10-CM | POA: Diagnosis not present

## 2014-10-16 DIAGNOSIS — D509 Iron deficiency anemia, unspecified: Secondary | ICD-10-CM | POA: Diagnosis not present

## 2014-10-16 DIAGNOSIS — N2581 Secondary hyperparathyroidism of renal origin: Secondary | ICD-10-CM | POA: Diagnosis not present

## 2014-10-16 DIAGNOSIS — E1129 Type 2 diabetes mellitus with other diabetic kidney complication: Secondary | ICD-10-CM | POA: Diagnosis not present

## 2014-10-16 DIAGNOSIS — N186 End stage renal disease: Secondary | ICD-10-CM | POA: Diagnosis not present

## 2014-10-18 DIAGNOSIS — N186 End stage renal disease: Secondary | ICD-10-CM | POA: Diagnosis not present

## 2014-10-18 DIAGNOSIS — D509 Iron deficiency anemia, unspecified: Secondary | ICD-10-CM | POA: Diagnosis not present

## 2014-10-18 DIAGNOSIS — N2581 Secondary hyperparathyroidism of renal origin: Secondary | ICD-10-CM | POA: Diagnosis not present

## 2014-10-18 DIAGNOSIS — E1129 Type 2 diabetes mellitus with other diabetic kidney complication: Secondary | ICD-10-CM | POA: Diagnosis not present

## 2014-10-23 DIAGNOSIS — N2581 Secondary hyperparathyroidism of renal origin: Secondary | ICD-10-CM | POA: Diagnosis not present

## 2014-10-23 DIAGNOSIS — N186 End stage renal disease: Secondary | ICD-10-CM | POA: Diagnosis not present

## 2014-10-23 DIAGNOSIS — D509 Iron deficiency anemia, unspecified: Secondary | ICD-10-CM | POA: Diagnosis not present

## 2014-10-23 DIAGNOSIS — E1129 Type 2 diabetes mellitus with other diabetic kidney complication: Secondary | ICD-10-CM | POA: Diagnosis not present

## 2014-10-25 DIAGNOSIS — D509 Iron deficiency anemia, unspecified: Secondary | ICD-10-CM | POA: Diagnosis not present

## 2014-10-25 DIAGNOSIS — N186 End stage renal disease: Secondary | ICD-10-CM | POA: Diagnosis not present

## 2014-10-25 DIAGNOSIS — N2581 Secondary hyperparathyroidism of renal origin: Secondary | ICD-10-CM | POA: Diagnosis not present

## 2014-10-25 DIAGNOSIS — E1129 Type 2 diabetes mellitus with other diabetic kidney complication: Secondary | ICD-10-CM | POA: Diagnosis not present

## 2014-10-27 DIAGNOSIS — N2581 Secondary hyperparathyroidism of renal origin: Secondary | ICD-10-CM | POA: Diagnosis not present

## 2014-10-27 DIAGNOSIS — E1129 Type 2 diabetes mellitus with other diabetic kidney complication: Secondary | ICD-10-CM | POA: Diagnosis not present

## 2014-10-27 DIAGNOSIS — N186 End stage renal disease: Secondary | ICD-10-CM | POA: Diagnosis not present

## 2014-10-27 DIAGNOSIS — D509 Iron deficiency anemia, unspecified: Secondary | ICD-10-CM | POA: Diagnosis not present

## 2014-10-28 DIAGNOSIS — Z992 Dependence on renal dialysis: Secondary | ICD-10-CM | POA: Diagnosis not present

## 2014-10-28 DIAGNOSIS — N186 End stage renal disease: Secondary | ICD-10-CM | POA: Diagnosis not present

## 2014-10-28 DIAGNOSIS — I120 Hypertensive chronic kidney disease with stage 5 chronic kidney disease or end stage renal disease: Secondary | ICD-10-CM | POA: Diagnosis not present

## 2014-10-30 DIAGNOSIS — E1129 Type 2 diabetes mellitus with other diabetic kidney complication: Secondary | ICD-10-CM | POA: Diagnosis not present

## 2014-10-30 DIAGNOSIS — N2581 Secondary hyperparathyroidism of renal origin: Secondary | ICD-10-CM | POA: Diagnosis not present

## 2014-10-30 DIAGNOSIS — N186 End stage renal disease: Secondary | ICD-10-CM | POA: Diagnosis not present

## 2014-11-01 DIAGNOSIS — N2581 Secondary hyperparathyroidism of renal origin: Secondary | ICD-10-CM | POA: Diagnosis not present

## 2014-11-01 DIAGNOSIS — D509 Iron deficiency anemia, unspecified: Secondary | ICD-10-CM | POA: Diagnosis not present

## 2014-11-01 DIAGNOSIS — E1129 Type 2 diabetes mellitus with other diabetic kidney complication: Secondary | ICD-10-CM | POA: Diagnosis not present

## 2014-11-01 DIAGNOSIS — D631 Anemia in chronic kidney disease: Secondary | ICD-10-CM | POA: Diagnosis not present

## 2014-11-01 DIAGNOSIS — N186 End stage renal disease: Secondary | ICD-10-CM | POA: Diagnosis not present

## 2014-11-03 DIAGNOSIS — D509 Iron deficiency anemia, unspecified: Secondary | ICD-10-CM | POA: Diagnosis not present

## 2014-11-03 DIAGNOSIS — N2581 Secondary hyperparathyroidism of renal origin: Secondary | ICD-10-CM | POA: Diagnosis not present

## 2014-11-03 DIAGNOSIS — D631 Anemia in chronic kidney disease: Secondary | ICD-10-CM | POA: Diagnosis not present

## 2014-11-03 DIAGNOSIS — N186 End stage renal disease: Secondary | ICD-10-CM | POA: Diagnosis not present

## 2014-11-03 DIAGNOSIS — E1129 Type 2 diabetes mellitus with other diabetic kidney complication: Secondary | ICD-10-CM | POA: Diagnosis not present

## 2014-11-06 DIAGNOSIS — N2581 Secondary hyperparathyroidism of renal origin: Secondary | ICD-10-CM | POA: Diagnosis not present

## 2014-11-06 DIAGNOSIS — E1129 Type 2 diabetes mellitus with other diabetic kidney complication: Secondary | ICD-10-CM | POA: Diagnosis not present

## 2014-11-06 DIAGNOSIS — D509 Iron deficiency anemia, unspecified: Secondary | ICD-10-CM | POA: Diagnosis not present

## 2014-11-06 DIAGNOSIS — D631 Anemia in chronic kidney disease: Secondary | ICD-10-CM | POA: Diagnosis not present

## 2014-11-06 DIAGNOSIS — N186 End stage renal disease: Secondary | ICD-10-CM | POA: Diagnosis not present

## 2014-11-08 DIAGNOSIS — N2581 Secondary hyperparathyroidism of renal origin: Secondary | ICD-10-CM | POA: Diagnosis not present

## 2014-11-08 DIAGNOSIS — E1129 Type 2 diabetes mellitus with other diabetic kidney complication: Secondary | ICD-10-CM | POA: Diagnosis not present

## 2014-11-08 DIAGNOSIS — D509 Iron deficiency anemia, unspecified: Secondary | ICD-10-CM | POA: Diagnosis not present

## 2014-11-08 DIAGNOSIS — N186 End stage renal disease: Secondary | ICD-10-CM | POA: Diagnosis not present

## 2014-11-08 DIAGNOSIS — D631 Anemia in chronic kidney disease: Secondary | ICD-10-CM | POA: Diagnosis not present

## 2014-11-10 DIAGNOSIS — N186 End stage renal disease: Secondary | ICD-10-CM | POA: Diagnosis not present

## 2014-11-10 DIAGNOSIS — E1129 Type 2 diabetes mellitus with other diabetic kidney complication: Secondary | ICD-10-CM | POA: Diagnosis not present

## 2014-11-10 DIAGNOSIS — D509 Iron deficiency anemia, unspecified: Secondary | ICD-10-CM | POA: Diagnosis not present

## 2014-11-10 DIAGNOSIS — D631 Anemia in chronic kidney disease: Secondary | ICD-10-CM | POA: Diagnosis not present

## 2014-11-10 DIAGNOSIS — N2581 Secondary hyperparathyroidism of renal origin: Secondary | ICD-10-CM | POA: Diagnosis not present

## 2014-11-13 DIAGNOSIS — E1129 Type 2 diabetes mellitus with other diabetic kidney complication: Secondary | ICD-10-CM | POA: Diagnosis not present

## 2014-11-13 DIAGNOSIS — D631 Anemia in chronic kidney disease: Secondary | ICD-10-CM | POA: Diagnosis not present

## 2014-11-13 DIAGNOSIS — D509 Iron deficiency anemia, unspecified: Secondary | ICD-10-CM | POA: Diagnosis not present

## 2014-11-13 DIAGNOSIS — N186 End stage renal disease: Secondary | ICD-10-CM | POA: Diagnosis not present

## 2014-11-13 DIAGNOSIS — N2581 Secondary hyperparathyroidism of renal origin: Secondary | ICD-10-CM | POA: Diagnosis not present

## 2014-11-15 DIAGNOSIS — N2581 Secondary hyperparathyroidism of renal origin: Secondary | ICD-10-CM | POA: Diagnosis not present

## 2014-11-15 DIAGNOSIS — D509 Iron deficiency anemia, unspecified: Secondary | ICD-10-CM | POA: Diagnosis not present

## 2014-11-15 DIAGNOSIS — N186 End stage renal disease: Secondary | ICD-10-CM | POA: Diagnosis not present

## 2014-11-15 DIAGNOSIS — D631 Anemia in chronic kidney disease: Secondary | ICD-10-CM | POA: Diagnosis not present

## 2014-11-15 DIAGNOSIS — E1129 Type 2 diabetes mellitus with other diabetic kidney complication: Secondary | ICD-10-CM | POA: Diagnosis not present

## 2014-11-17 DIAGNOSIS — N186 End stage renal disease: Secondary | ICD-10-CM | POA: Diagnosis not present

## 2014-11-17 DIAGNOSIS — D631 Anemia in chronic kidney disease: Secondary | ICD-10-CM | POA: Diagnosis not present

## 2014-11-17 DIAGNOSIS — D509 Iron deficiency anemia, unspecified: Secondary | ICD-10-CM | POA: Diagnosis not present

## 2014-11-17 DIAGNOSIS — N2581 Secondary hyperparathyroidism of renal origin: Secondary | ICD-10-CM | POA: Diagnosis not present

## 2014-11-17 DIAGNOSIS — E1129 Type 2 diabetes mellitus with other diabetic kidney complication: Secondary | ICD-10-CM | POA: Diagnosis not present

## 2014-11-20 DIAGNOSIS — D509 Iron deficiency anemia, unspecified: Secondary | ICD-10-CM | POA: Diagnosis not present

## 2014-11-20 DIAGNOSIS — E1129 Type 2 diabetes mellitus with other diabetic kidney complication: Secondary | ICD-10-CM | POA: Diagnosis not present

## 2014-11-20 DIAGNOSIS — N186 End stage renal disease: Secondary | ICD-10-CM | POA: Diagnosis not present

## 2014-11-20 DIAGNOSIS — D631 Anemia in chronic kidney disease: Secondary | ICD-10-CM | POA: Diagnosis not present

## 2014-11-20 DIAGNOSIS — N2581 Secondary hyperparathyroidism of renal origin: Secondary | ICD-10-CM | POA: Diagnosis not present

## 2014-11-22 DIAGNOSIS — E1129 Type 2 diabetes mellitus with other diabetic kidney complication: Secondary | ICD-10-CM | POA: Diagnosis not present

## 2014-11-22 DIAGNOSIS — N2581 Secondary hyperparathyroidism of renal origin: Secondary | ICD-10-CM | POA: Diagnosis not present

## 2014-11-22 DIAGNOSIS — N186 End stage renal disease: Secondary | ICD-10-CM | POA: Diagnosis not present

## 2014-11-22 DIAGNOSIS — D631 Anemia in chronic kidney disease: Secondary | ICD-10-CM | POA: Diagnosis not present

## 2014-11-22 DIAGNOSIS — D509 Iron deficiency anemia, unspecified: Secondary | ICD-10-CM | POA: Diagnosis not present

## 2014-11-24 DIAGNOSIS — D509 Iron deficiency anemia, unspecified: Secondary | ICD-10-CM | POA: Diagnosis not present

## 2014-11-24 DIAGNOSIS — E1129 Type 2 diabetes mellitus with other diabetic kidney complication: Secondary | ICD-10-CM | POA: Diagnosis not present

## 2014-11-24 DIAGNOSIS — N2581 Secondary hyperparathyroidism of renal origin: Secondary | ICD-10-CM | POA: Diagnosis not present

## 2014-11-24 DIAGNOSIS — D631 Anemia in chronic kidney disease: Secondary | ICD-10-CM | POA: Diagnosis not present

## 2014-11-24 DIAGNOSIS — N186 End stage renal disease: Secondary | ICD-10-CM | POA: Diagnosis not present

## 2014-11-26 DIAGNOSIS — I120 Hypertensive chronic kidney disease with stage 5 chronic kidney disease or end stage renal disease: Secondary | ICD-10-CM | POA: Diagnosis not present

## 2014-11-26 DIAGNOSIS — N186 End stage renal disease: Secondary | ICD-10-CM | POA: Diagnosis not present

## 2014-11-26 DIAGNOSIS — Z992 Dependence on renal dialysis: Secondary | ICD-10-CM | POA: Diagnosis not present

## 2014-11-27 DIAGNOSIS — D631 Anemia in chronic kidney disease: Secondary | ICD-10-CM | POA: Diagnosis not present

## 2014-11-27 DIAGNOSIS — N2581 Secondary hyperparathyroidism of renal origin: Secondary | ICD-10-CM | POA: Diagnosis not present

## 2014-11-27 DIAGNOSIS — D509 Iron deficiency anemia, unspecified: Secondary | ICD-10-CM | POA: Diagnosis not present

## 2014-11-27 DIAGNOSIS — N186 End stage renal disease: Secondary | ICD-10-CM | POA: Diagnosis not present

## 2014-11-27 DIAGNOSIS — E1129 Type 2 diabetes mellitus with other diabetic kidney complication: Secondary | ICD-10-CM | POA: Diagnosis not present

## 2014-11-29 DIAGNOSIS — N2581 Secondary hyperparathyroidism of renal origin: Secondary | ICD-10-CM | POA: Diagnosis not present

## 2014-11-29 DIAGNOSIS — D509 Iron deficiency anemia, unspecified: Secondary | ICD-10-CM | POA: Diagnosis not present

## 2014-11-29 DIAGNOSIS — D631 Anemia in chronic kidney disease: Secondary | ICD-10-CM | POA: Diagnosis not present

## 2014-11-29 DIAGNOSIS — E1129 Type 2 diabetes mellitus with other diabetic kidney complication: Secondary | ICD-10-CM | POA: Diagnosis not present

## 2014-11-29 DIAGNOSIS — N186 End stage renal disease: Secondary | ICD-10-CM | POA: Diagnosis not present

## 2014-12-01 DIAGNOSIS — D631 Anemia in chronic kidney disease: Secondary | ICD-10-CM | POA: Diagnosis not present

## 2014-12-01 DIAGNOSIS — E1129 Type 2 diabetes mellitus with other diabetic kidney complication: Secondary | ICD-10-CM | POA: Diagnosis not present

## 2014-12-01 DIAGNOSIS — N186 End stage renal disease: Secondary | ICD-10-CM | POA: Diagnosis not present

## 2014-12-01 DIAGNOSIS — N2581 Secondary hyperparathyroidism of renal origin: Secondary | ICD-10-CM | POA: Diagnosis not present

## 2014-12-01 DIAGNOSIS — D509 Iron deficiency anemia, unspecified: Secondary | ICD-10-CM | POA: Diagnosis not present

## 2014-12-04 DIAGNOSIS — D509 Iron deficiency anemia, unspecified: Secondary | ICD-10-CM | POA: Diagnosis not present

## 2014-12-04 DIAGNOSIS — E1129 Type 2 diabetes mellitus with other diabetic kidney complication: Secondary | ICD-10-CM | POA: Diagnosis not present

## 2014-12-04 DIAGNOSIS — D631 Anemia in chronic kidney disease: Secondary | ICD-10-CM | POA: Diagnosis not present

## 2014-12-04 DIAGNOSIS — N2581 Secondary hyperparathyroidism of renal origin: Secondary | ICD-10-CM | POA: Diagnosis not present

## 2014-12-04 DIAGNOSIS — N186 End stage renal disease: Secondary | ICD-10-CM | POA: Diagnosis not present

## 2014-12-06 DIAGNOSIS — D631 Anemia in chronic kidney disease: Secondary | ICD-10-CM | POA: Diagnosis not present

## 2014-12-06 DIAGNOSIS — D509 Iron deficiency anemia, unspecified: Secondary | ICD-10-CM | POA: Diagnosis not present

## 2014-12-06 DIAGNOSIS — N2581 Secondary hyperparathyroidism of renal origin: Secondary | ICD-10-CM | POA: Diagnosis not present

## 2014-12-06 DIAGNOSIS — N186 End stage renal disease: Secondary | ICD-10-CM | POA: Diagnosis not present

## 2014-12-06 DIAGNOSIS — E1129 Type 2 diabetes mellitus with other diabetic kidney complication: Secondary | ICD-10-CM | POA: Diagnosis not present

## 2014-12-08 DIAGNOSIS — N186 End stage renal disease: Secondary | ICD-10-CM | POA: Diagnosis not present

## 2014-12-08 DIAGNOSIS — D631 Anemia in chronic kidney disease: Secondary | ICD-10-CM | POA: Diagnosis not present

## 2014-12-08 DIAGNOSIS — E1129 Type 2 diabetes mellitus with other diabetic kidney complication: Secondary | ICD-10-CM | POA: Diagnosis not present

## 2014-12-08 DIAGNOSIS — N2581 Secondary hyperparathyroidism of renal origin: Secondary | ICD-10-CM | POA: Diagnosis not present

## 2014-12-08 DIAGNOSIS — D509 Iron deficiency anemia, unspecified: Secondary | ICD-10-CM | POA: Diagnosis not present

## 2014-12-11 DIAGNOSIS — N2581 Secondary hyperparathyroidism of renal origin: Secondary | ICD-10-CM | POA: Diagnosis not present

## 2014-12-11 DIAGNOSIS — E1129 Type 2 diabetes mellitus with other diabetic kidney complication: Secondary | ICD-10-CM | POA: Diagnosis not present

## 2014-12-11 DIAGNOSIS — D509 Iron deficiency anemia, unspecified: Secondary | ICD-10-CM | POA: Diagnosis not present

## 2014-12-11 DIAGNOSIS — D631 Anemia in chronic kidney disease: Secondary | ICD-10-CM | POA: Diagnosis not present

## 2014-12-11 DIAGNOSIS — N186 End stage renal disease: Secondary | ICD-10-CM | POA: Diagnosis not present

## 2014-12-13 DIAGNOSIS — N186 End stage renal disease: Secondary | ICD-10-CM | POA: Diagnosis not present

## 2014-12-13 DIAGNOSIS — N2581 Secondary hyperparathyroidism of renal origin: Secondary | ICD-10-CM | POA: Diagnosis not present

## 2014-12-13 DIAGNOSIS — E1129 Type 2 diabetes mellitus with other diabetic kidney complication: Secondary | ICD-10-CM | POA: Diagnosis not present

## 2014-12-13 DIAGNOSIS — D631 Anemia in chronic kidney disease: Secondary | ICD-10-CM | POA: Diagnosis not present

## 2014-12-13 DIAGNOSIS — D509 Iron deficiency anemia, unspecified: Secondary | ICD-10-CM | POA: Diagnosis not present

## 2014-12-15 DIAGNOSIS — D631 Anemia in chronic kidney disease: Secondary | ICD-10-CM | POA: Diagnosis not present

## 2014-12-15 DIAGNOSIS — N186 End stage renal disease: Secondary | ICD-10-CM | POA: Diagnosis not present

## 2014-12-15 DIAGNOSIS — E1129 Type 2 diabetes mellitus with other diabetic kidney complication: Secondary | ICD-10-CM | POA: Diagnosis not present

## 2014-12-15 DIAGNOSIS — N2581 Secondary hyperparathyroidism of renal origin: Secondary | ICD-10-CM | POA: Diagnosis not present

## 2014-12-15 DIAGNOSIS — D509 Iron deficiency anemia, unspecified: Secondary | ICD-10-CM | POA: Diagnosis not present

## 2014-12-18 DIAGNOSIS — D509 Iron deficiency anemia, unspecified: Secondary | ICD-10-CM | POA: Diagnosis not present

## 2014-12-18 DIAGNOSIS — N2581 Secondary hyperparathyroidism of renal origin: Secondary | ICD-10-CM | POA: Diagnosis not present

## 2014-12-18 DIAGNOSIS — E1129 Type 2 diabetes mellitus with other diabetic kidney complication: Secondary | ICD-10-CM | POA: Diagnosis not present

## 2014-12-18 DIAGNOSIS — N186 End stage renal disease: Secondary | ICD-10-CM | POA: Diagnosis not present

## 2014-12-18 DIAGNOSIS — D631 Anemia in chronic kidney disease: Secondary | ICD-10-CM | POA: Diagnosis not present

## 2014-12-20 DIAGNOSIS — N186 End stage renal disease: Secondary | ICD-10-CM | POA: Diagnosis not present

## 2014-12-20 DIAGNOSIS — N2581 Secondary hyperparathyroidism of renal origin: Secondary | ICD-10-CM | POA: Diagnosis not present

## 2014-12-20 DIAGNOSIS — E1129 Type 2 diabetes mellitus with other diabetic kidney complication: Secondary | ICD-10-CM | POA: Diagnosis not present

## 2014-12-20 DIAGNOSIS — D631 Anemia in chronic kidney disease: Secondary | ICD-10-CM | POA: Diagnosis not present

## 2014-12-20 DIAGNOSIS — D509 Iron deficiency anemia, unspecified: Secondary | ICD-10-CM | POA: Diagnosis not present

## 2014-12-22 DIAGNOSIS — N186 End stage renal disease: Secondary | ICD-10-CM | POA: Diagnosis not present

## 2014-12-22 DIAGNOSIS — E1129 Type 2 diabetes mellitus with other diabetic kidney complication: Secondary | ICD-10-CM | POA: Diagnosis not present

## 2014-12-22 DIAGNOSIS — D631 Anemia in chronic kidney disease: Secondary | ICD-10-CM | POA: Diagnosis not present

## 2014-12-22 DIAGNOSIS — N2581 Secondary hyperparathyroidism of renal origin: Secondary | ICD-10-CM | POA: Diagnosis not present

## 2014-12-22 DIAGNOSIS — D509 Iron deficiency anemia, unspecified: Secondary | ICD-10-CM | POA: Diagnosis not present

## 2014-12-25 DIAGNOSIS — D631 Anemia in chronic kidney disease: Secondary | ICD-10-CM | POA: Diagnosis not present

## 2014-12-25 DIAGNOSIS — D509 Iron deficiency anemia, unspecified: Secondary | ICD-10-CM | POA: Diagnosis not present

## 2014-12-25 DIAGNOSIS — N2581 Secondary hyperparathyroidism of renal origin: Secondary | ICD-10-CM | POA: Diagnosis not present

## 2014-12-25 DIAGNOSIS — E1129 Type 2 diabetes mellitus with other diabetic kidney complication: Secondary | ICD-10-CM | POA: Diagnosis not present

## 2014-12-25 DIAGNOSIS — N186 End stage renal disease: Secondary | ICD-10-CM | POA: Diagnosis not present

## 2014-12-27 DIAGNOSIS — N186 End stage renal disease: Secondary | ICD-10-CM | POA: Diagnosis not present

## 2014-12-27 DIAGNOSIS — D509 Iron deficiency anemia, unspecified: Secondary | ICD-10-CM | POA: Diagnosis not present

## 2014-12-27 DIAGNOSIS — Z992 Dependence on renal dialysis: Secondary | ICD-10-CM | POA: Diagnosis not present

## 2014-12-27 DIAGNOSIS — I120 Hypertensive chronic kidney disease with stage 5 chronic kidney disease or end stage renal disease: Secondary | ICD-10-CM | POA: Diagnosis not present

## 2014-12-27 DIAGNOSIS — D631 Anemia in chronic kidney disease: Secondary | ICD-10-CM | POA: Diagnosis not present

## 2014-12-27 DIAGNOSIS — E1129 Type 2 diabetes mellitus with other diabetic kidney complication: Secondary | ICD-10-CM | POA: Diagnosis not present

## 2014-12-27 DIAGNOSIS — N2581 Secondary hyperparathyroidism of renal origin: Secondary | ICD-10-CM | POA: Diagnosis not present

## 2014-12-29 DIAGNOSIS — D631 Anemia in chronic kidney disease: Secondary | ICD-10-CM | POA: Diagnosis not present

## 2014-12-29 DIAGNOSIS — N2581 Secondary hyperparathyroidism of renal origin: Secondary | ICD-10-CM | POA: Diagnosis not present

## 2014-12-29 DIAGNOSIS — N186 End stage renal disease: Secondary | ICD-10-CM | POA: Diagnosis not present

## 2014-12-29 DIAGNOSIS — E1129 Type 2 diabetes mellitus with other diabetic kidney complication: Secondary | ICD-10-CM | POA: Diagnosis not present

## 2015-01-01 DIAGNOSIS — N186 End stage renal disease: Secondary | ICD-10-CM | POA: Diagnosis not present

## 2015-01-01 DIAGNOSIS — E1129 Type 2 diabetes mellitus with other diabetic kidney complication: Secondary | ICD-10-CM | POA: Diagnosis not present

## 2015-01-01 DIAGNOSIS — D631 Anemia in chronic kidney disease: Secondary | ICD-10-CM | POA: Diagnosis not present

## 2015-01-01 DIAGNOSIS — N2581 Secondary hyperparathyroidism of renal origin: Secondary | ICD-10-CM | POA: Diagnosis not present

## 2015-01-03 DIAGNOSIS — N186 End stage renal disease: Secondary | ICD-10-CM | POA: Diagnosis not present

## 2015-01-03 DIAGNOSIS — D631 Anemia in chronic kidney disease: Secondary | ICD-10-CM | POA: Diagnosis not present

## 2015-01-03 DIAGNOSIS — E1129 Type 2 diabetes mellitus with other diabetic kidney complication: Secondary | ICD-10-CM | POA: Diagnosis not present

## 2015-01-03 DIAGNOSIS — N2581 Secondary hyperparathyroidism of renal origin: Secondary | ICD-10-CM | POA: Diagnosis not present

## 2015-01-05 DIAGNOSIS — N186 End stage renal disease: Secondary | ICD-10-CM | POA: Diagnosis not present

## 2015-01-05 DIAGNOSIS — D631 Anemia in chronic kidney disease: Secondary | ICD-10-CM | POA: Diagnosis not present

## 2015-01-05 DIAGNOSIS — E1129 Type 2 diabetes mellitus with other diabetic kidney complication: Secondary | ICD-10-CM | POA: Diagnosis not present

## 2015-01-05 DIAGNOSIS — N2581 Secondary hyperparathyroidism of renal origin: Secondary | ICD-10-CM | POA: Diagnosis not present

## 2015-01-08 DIAGNOSIS — N186 End stage renal disease: Secondary | ICD-10-CM | POA: Diagnosis not present

## 2015-01-08 DIAGNOSIS — E1129 Type 2 diabetes mellitus with other diabetic kidney complication: Secondary | ICD-10-CM | POA: Diagnosis not present

## 2015-01-08 DIAGNOSIS — D631 Anemia in chronic kidney disease: Secondary | ICD-10-CM | POA: Diagnosis not present

## 2015-01-08 DIAGNOSIS — N2581 Secondary hyperparathyroidism of renal origin: Secondary | ICD-10-CM | POA: Diagnosis not present

## 2015-01-10 DIAGNOSIS — E1129 Type 2 diabetes mellitus with other diabetic kidney complication: Secondary | ICD-10-CM | POA: Diagnosis not present

## 2015-01-10 DIAGNOSIS — N2581 Secondary hyperparathyroidism of renal origin: Secondary | ICD-10-CM | POA: Diagnosis not present

## 2015-01-10 DIAGNOSIS — D631 Anemia in chronic kidney disease: Secondary | ICD-10-CM | POA: Diagnosis not present

## 2015-01-10 DIAGNOSIS — N186 End stage renal disease: Secondary | ICD-10-CM | POA: Diagnosis not present

## 2015-01-12 DIAGNOSIS — N186 End stage renal disease: Secondary | ICD-10-CM | POA: Diagnosis not present

## 2015-01-12 DIAGNOSIS — E1129 Type 2 diabetes mellitus with other diabetic kidney complication: Secondary | ICD-10-CM | POA: Diagnosis not present

## 2015-01-12 DIAGNOSIS — D631 Anemia in chronic kidney disease: Secondary | ICD-10-CM | POA: Diagnosis not present

## 2015-01-12 DIAGNOSIS — N2581 Secondary hyperparathyroidism of renal origin: Secondary | ICD-10-CM | POA: Diagnosis not present

## 2015-01-15 DIAGNOSIS — D631 Anemia in chronic kidney disease: Secondary | ICD-10-CM | POA: Diagnosis not present

## 2015-01-15 DIAGNOSIS — E1129 Type 2 diabetes mellitus with other diabetic kidney complication: Secondary | ICD-10-CM | POA: Diagnosis not present

## 2015-01-15 DIAGNOSIS — N186 End stage renal disease: Secondary | ICD-10-CM | POA: Diagnosis not present

## 2015-01-15 DIAGNOSIS — N2581 Secondary hyperparathyroidism of renal origin: Secondary | ICD-10-CM | POA: Diagnosis not present

## 2015-01-17 DIAGNOSIS — E1129 Type 2 diabetes mellitus with other diabetic kidney complication: Secondary | ICD-10-CM | POA: Diagnosis not present

## 2015-01-17 DIAGNOSIS — N2581 Secondary hyperparathyroidism of renal origin: Secondary | ICD-10-CM | POA: Diagnosis not present

## 2015-01-17 DIAGNOSIS — N186 End stage renal disease: Secondary | ICD-10-CM | POA: Diagnosis not present

## 2015-01-17 DIAGNOSIS — D631 Anemia in chronic kidney disease: Secondary | ICD-10-CM | POA: Diagnosis not present

## 2015-01-19 DIAGNOSIS — N186 End stage renal disease: Secondary | ICD-10-CM | POA: Diagnosis not present

## 2015-01-19 DIAGNOSIS — N2581 Secondary hyperparathyroidism of renal origin: Secondary | ICD-10-CM | POA: Diagnosis not present

## 2015-01-19 DIAGNOSIS — D631 Anemia in chronic kidney disease: Secondary | ICD-10-CM | POA: Diagnosis not present

## 2015-01-19 DIAGNOSIS — E1129 Type 2 diabetes mellitus with other diabetic kidney complication: Secondary | ICD-10-CM | POA: Diagnosis not present

## 2015-01-19 NOTE — Consult Note (Signed)
Allergies:  No Known Allergies:   Assessment/Plan:  Assessment/Plan 34 yo M with 20 yr h/o type 1 diabetes with polyneuropathy and retinopathy and nephropathy, ESRD on HD - admitted yesterday with N/V/DKA. Was hospitalized 2 weeks ago with intractable N/V. A1c 7.1%. Home regimen is Lantus 9 units qhs and Humalog 6 units tid AC. Currently managed on IVF and IV insulin. Delay in repeating metB as venous blood draws have been challenging.   Plan is to covert to SQ insulin once AG has closed. I recommend converting to: Lantus 9 units q24 hrs Humalog 3 units tid AC.  Prefer Lantus to Levemir in this case, however if Lantus and Humalog not available, then may use basal/bolus equivalents.  Will follow along and a full consult will be dictated.   Electronic Signatures: Judi Cong (MD)  (Signed 02-Oct-15 13:13)  AuthoredFabienne Bruns, Assessment/Plan   Last Updated: 02-Oct-15 13:13 by Judi Cong (MD)

## 2015-01-19 NOTE — Discharge Summary (Signed)
PATIENT NAME:  [name], [name] MR#:  [id number] DATE OF BIRTH:  05-Jan-1981  DATE OF ADMISSION:  06/18/2014 DATE OF DISCHARGE:  06/20/2014   DISCHARGE DIAGNOSES:  1.  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   2.  End-stage renal disease hemodialysis. 3.  Malignant hypertension.  4.  Type 2 diabetes mellitus.   DISCHARGE MEDICATIONS:  1.  Sensipar at 30 mg p.o. daily. 2.  Clonidine 0.3 mg p.o. b.i.d.  3.  Hydralazine 50 mg p.o. b.i.d.  4.  Renvela 800 mg 3 tablets p.o. t.i.d.  5.  Lantus 9 units at bedtime.  6.  Humalog 6 units t.i.d.  7.  Coreg 25 mg p.o. b.i.d.   8.  Amlodipine 10 mg p.o. daily.  9.  Reglan 5 mg 4 times a day with meals.  10. Pepcid 20 mg every other day.  The patient is given prescription for Reglan and also Pepcid.   CONSULTATIONS: Gastrointestinal consult with Gaylyn Cheers, MD   HOSPITAL COURSE: The patient is a 34 year old male patient with a history of type 2 diabetes mellitus and also hypertension, ESRD on hemodialysis, admitted on 06/18/2014 because of intractable nausea, vomiting and abdominal pain.  The patient also noted to have a temperature 100 degrees Fahrenheit in the ER.  Please look at my history and physical for full details.  1.   The patient started on IV fluids, IV Zofran, IV Reglan and Protonix for a possible diabetic gastroparesis and also acute gastritis.  The patient is moved from [place] and started dialysis here with [facility] nephrology and had 1 dialysis session.  He did not miss the dialysis sessions.  The patient complained of abdominal pain, so we did an abdominal CAT scan.  The patient's CAT scan of abdomen showed gastric wall thickening with possible gastritis and also a possible left lobe of the liver lesions.  Also an MRI of the abdomen for possible liver mass.  MRI of abdomen showed possible hemangioma, which is 2.2 cm on the right lobe of the liver, on the left lobe there is  some enhancement which is seen in the CAT scan and the patient advised to have a repeat MRI in 3 to 6 months.  The patient improved nicely with IV Reglan and PPIs.  His blood cultures have been negative, and also he was able to tolerate the diet well, so we discharged him home with Reglan PPIs.   2.  End-stage renal disease. He is on hemodialysis, seen by nephrology, Dr. Anthonette Legato and Dr. Candiss Norse.  The patient had his regular dialysis sessions.  He gets usually Tuesday, Thursday, and Saturday.  Yesterday was Wednesday, but we did short dialysis session because potassium was elevated at 5.8 and sodium 129 so he did get treatment for hyperkalemia.  3.  Type 2 diabetes mellitus.  The patient was on Lantus and sliding scale coverage, and will resume her diet.  4.  Hypertension. The patient has malignant hypertension.  Initially when he came in, his blood pressure was 220/120 and we had to give him IV hydralazine and labetalol because he was nauseous and had vomiting.  We have resumed his own medication and at the time of discharge, his blood pressure improved.  His blood pressure 134/77, heart rate 84 at the time of discharge. His malignant hypertension is controlled.  We asked him to continue his home medications.   FOLLOW UP:  He is advised to follow up with GI  in 3 months, follow-up on his MRA of the abdomen and also regarding his gastroparesis evaluation. We also gave a prescription for Pepcid for possible gastritis.  TIME SPENT ON DISCHARGE:  30 minutes.    ____________________________ Epifanio Lesches, MD sk:DT D: 06/21/2014 11:09:56 ET T: 06/21/2014 11:41:30 ET JOB#: [id number]  cc: Epifanio Lesches, MD, <Dictator> Epifanio Lesches MD ELECTRONICALLY SIGNED 06/27/2014 14:46

## 2015-01-19 NOTE — Consult Note (Signed)
PATIENT NAME:  [name], [name] MR#:  [id number] DATE OF BIRTH:  14-Jul-1981  DATE OF CONSULTATION:  06/19/2014  REFERRING PHYSICIAN:   CONSULTING PHYSICIAN:  Joelene Millin A. Jerelene Redden, ANP (Adult Nurse Practitioner)  REFERRING PHYSICIAN:  Dr. Manuella Ghazi.    CONSULTING PHYSICIAN: Gaylyn Cheers, MD/Kelden Lavallee Jerelene Redden, ANP.     REASON FOR CONSULTATION: This 34 year old patient presented to the hospital with acute episode of nausea, vomiting, and mid epigastric pain. He had a fever of 100 degrees. The patient does have a history of diabetes mellitus since he was 34 years old. He has been chronically on Reglan and he decided to stop the medication a few months ago because he takes too many medicines and he did not believe this tiny pill was very important. The patient says he has developed a gradual nausea that has been progressive. He was in significant abdominal pain and reports now with medication and restart of Reglan he is feeling much better. He says he is comfortable now.  The patient is undergoing dialysis at the time of this interview. His admission laboratory studies revealed WBC of 14.3, alkaline phosphatase of 140, total bilirubin was normal, as well as albumin. The admitting CT of the abdomen was grossly abnormal. It did reveal a 3.1 cm liver mass that was apparently new. There was nodular peripheral enhancement.  There were smaller right liver lesions as well. He had fatty infiltration. Gallbladder and spleen were normal. Pancreas was normal. The appendix and bowel were normal with the exception of diverticulosis. The gastric wall showed thickening involving the proximal stomach which could be due to gastritis versus gastric neoplasm. The patient denies history of an upper endoscopy or colonoscopy. He denies unusual weight loss.   PAST MEDICAL HISTORY:  1. Type 1 diabetes mellitus diagnosed in 1995.  2. End-stage renal disease.  3. Hypertension.  4. Gastroparesis.   PAST SURGICAL HISTORY: Dialysis catheter  placement.   MEDICATIONS ON ADMISSION:  1. Amlodipine 10 mg daily.  2. Coreg 25 mg twice daily.   3. Clonidine 0.3 mg twice daily.  4. Humalog 6 units 3 times daily with meals.  5. Hydralazine 50 mg twice daily.  6. Lantus 9 units at bedtime.  7. Renvela 800 mg 3 tablets 3 times daily.  8. Sensipar 30 mg daily.  9. Reglan, discontinued 2 months ago.   ALLERGIES: NKDA.   HABITS: Denies alcohol, tobacco use.   SOCIAL HISTORY: The patient is not married. He has recently moved from [place] to [place]. The patient says he is single. Multiple tattoos.   REVIEW OF SYSTEMS: Patient initially drowsy, but with pleasant conversation he became more responsive and more conversant. He  is smiling, talking freely. He denies history of hepatitis, HIV, or previous liver problems. He denies any abdominal pain at this time. He reports significant epigastric pain, but cannot give specific details about when it started or the quality or radiation. He had some nausea, reports he did vomit and he thinks it was the food that he had eaten. He has noted no coffee grounds or hematemesis. He reports bowel habits are normal. He could give no further specific review of systems.   PHYSICAL EXAMINATION: Temperature on admission 100 to 99.7, currently 97.9, 91, 24, 146/84, pulse oximetry room air is 99%.  GENERAL: Chronically ill-looking, small-framed Caucasian black male, slightly obese, NAD.  HEENT: Head is normocephalic. Conjunctivae are pink. Oral mucosa is moist, intact.  NECK: Supple. Trachea is midline.  CARDIAC: S1 and S2 without murmur.  LUNGS:  CTA anteriorly. Respirations are nonlabored. He has a central catheter in the right upper chest region.  ABDOMEN: Soft, bowel sounds are present, and there is no tenderness with palpation.  RECTAL: Deferred.  SKIN: Warm and dry.  EXTREMITIES: With fistula in left upper extremity undergoing dialysis.  NEUROLOGIC: The patient is somewhat slow to answer questions, but  he follows commands. Grasp is equal.  PSYCHIATRIC: He is somewhat drowsy, responds with conversation appropriately. Mood seems to be normal. Affect within normal.   LABORATORY DATA: Admission blood work notable for glucose 436, BUN 48, creatinine 9.72, sodium 131, potassium 4.5, alkaline phosphatase 140, ALT is 11. Troponin is 0.02. WBC is 14.2, hemoglobin is 11.8.   Repeat laboratory studies this morning with glucose 571, BUN is 67, creatinine is 10.96, sodium 129, potassium 5.8. WBC 14.3, hemoglobin 11.6.   RADIOLOGY: Admission chest x-ray single view showed no acute abnormality. There is a right jugular central venous catheter in place. CT of the abdomen showed possible gastric wall thickening, 3.1 cm mass in the left lobe of the liver representing a change from prior study. This could be a hemangioma. Two smaller low attenuated lesions in the right lobe of the liver which are too small to characterize. It is possible that these are hemangiomas. Radiologist commented about following this up with a hemangioma protocol MRI to further characterize.   IMPRESSION:  1.  The patient presents with nausea and vomiting that has improved with morphine medication and restart of Reglan. Possibility of his gastrointestinal complaints secondary to gastroparesis is high. CT suggested possible gastritis which is also a possibility. The patient denies prior history of EGD. He denies heartburn or indigestion, but he did report nausea and vomiting. He is on Pepcid. 2.  The CT revealed a 3 cm liver mass that needs further characterization with 2 smaller lesions as well. Etiology with a slight fever to rule out abscess, rule out hemangioma, rule out malignancy.   PLAN:  1.  Further laboratory studies to include alpha-fetoprotein, CEA, hepatitis panel, and an MRI of the abdomen with contrast to rule out abscess, radiologist was concerned about hemangioma protocol as well.  2.  Consider EGD in the future if necessary. The  patient is covered with Pepcid at this time.  3.  For electrolyte imbalance and hyperglycemia he is undergoing medical treatment and  hemodialysis.   This case was discussed with Dr. Vira Agar in collaboration of care. There is consideration for empiric antibiotics. Further GI recommendations pending.   Thank you for the consultation.   These services provided by Joelene Millin A. Jerelene Redden, MS, APRN,, BC, ANP under collaborative agreement with Gaylyn Cheers, MD.      ____________________________ Janalyn Harder Jerelene Redden, ANP (Adult Nurse Practitioner) kam:bu D: 06/19/2014 16:55:07 ET T: 06/19/2014 17:11:03 ET JOB#: [id number]  cc: Joelene Millin A. Jerelene Redden, ANP (Adult Nurse Practitioner), <Dictator> Janalyn Harder Sherlyn Hay, MSN, ANP-BC Adult Nurse Practitioner ELECTRONICALLY SIGNED 06/19/2014 17:58

## 2015-01-19 NOTE — H&P (Signed)
PATIENT NAME:  [name], [name] MR#:  [id number] DATE OF BIRTH:  [birth year]  DATE OF ADMISSION:  06/28/2014  PRIMARY CARE PHYSICIAN: None.   REFERRING PHYSICIAN: Conni Slipper, MD  CHIEF COMPLAINT: Nausea, vomiting.   HISTORY OF PRESENT ILLNESS: [name] is a 34 year old male with a history of end-stage renal disease, on hemodialysis, diabetes mellitus, hypertension presented with nausea and vomiting since this afternoon. Per the patient's mother, patient went for dialysis, was doing well, came back home, started to complain of nausea, vomiting, multiple episodes of vomiting. Concerning this, the patient is brought to the Emergency Department. The patient also complained of abdominal pain. The patient had a recent admission on 06/18/2014 and was discharged on 06/20/2014 and was treated for diabetic gastroparesis. Workup in the Emergency Department, the patient is found to have elevated blood sugar of 300, found to have low bicarbonate of 19 with anion gap of 19. The patient states last bowel movement was yesterday.   PAST MEDICAL HISTORY:  1.  End-stage renal disease, on hemodialysis.  2.  Hypertension.  3.  Diabetes mellitus.   PAST SURGICAL HISTORY: Dialysis catheter placement.   ALLERGIES: No known drug allergies.   SOCIAL HISTORY: No history of smoking, drinking alcohol or using illicit drugs.   HOME MEDICATIONS:  1.  Sensipar 30 mg once a day.  2.  Renvela  800 mg 3 tablets 3 times a day.  3.  Reglan 5 mg 4 times a day.  4.  Pepcid 20 mg every other day.  5.  Lantus 9 units at bedtime.  6.  Hydralazine 50 mg 2 times a day.  7.  Clonidine 0.3 mg 2 times a day.  8.  Coreg 25 mg 2 times a day.  9.  Amlodipine 10 mg once a day.   SOCIAL HISTORY: No history of smoking, drinking alcohol or using illicit drugs. Lives by himself.   FAMILY HISTORY: Diabetes mellitus.    REVIEW OF SYSTEMS:  CONSTITUTIONAL: Experiencing generalized weakness.  EYES: No change in vision.  ENT: No change  in hearing.  RESPIRATORY: No cough, shortness of breath.  CARDIOVASCULAR: No chest pain, palpations.  GASTROINTESTINAL: Has nausea, vomiting, abdominal pain.  GENITOURINARY: Makes a small amount of urine.  SKIN: No rash or lesions.  ENDOCRINE: Has diagnosis of diabetes mellitus.  NEUROLOGIC: No weakness or numbness in any part of the body.   PHYSICAL EXAMINATION:  GENERAL: This is a well-built, well-nourished, obese male laying down, sitting in the bed, not in distress.  VITAL SIGNS: Temperature 99.3, pulse 103, blood pressure 198/103, respiratory rate of 24, oxygen saturation is 99% on room air.  HEENT: Head normocephalic, atraumatic. Eyes: No scleral icterus. Conjunctivae normal. Pupils equal and react to light. Mucous membranes moist. No pharyngeal erythema.  NECK: Supple. No lymphadenopathy. No JVD, no carotid bruit.  CHEST: No focal tenderness.  LUNGS: Bilaterally clear to auscultation.  HEART: S1, S2 regular. No murmurs are heard.  ABDOMEN: Bowel sounds plus. Soft, nontender, nondistended.  EXTREMITIES: No pedal edema. Pulses 2+.  NEUROLOGIC: The patient is alert, oriented to place, person and time. No apparent cranial nerve abnormalities. Motor 5/5 in upper and lower extremities.   LABORATORY DATA: CBC: WBC of 10.9, hemoglobin 11.7.   BMP: Glucose 303, BUN 25, creatinine of 5.25, bicarbonate 19, anion gap of 19. Rest of all the values are within normal limits. Hemoglobin A1c 7.1.   Lactic acid 1.9.   ABG: PH of 7.33, pCO2 of 33.   ASSESSMENT AND PLAN:  [name] is a 34 year old male who comes to the Emergency Department with nausea and vomiting.  1.  Nausea, vomiting. The patient also has elevated blood sugars with anion gap. We will start the patient on insulin drip, follow up with the BMP. Keep the patient n.p.o. We will continue to provide anti-nausea medications.  2.  End-stage renal disease, on hemodialysis. We will consult nephrology. The patient had dialysis today.  3.   Hypertension, accelerated. This could be secondary to patient unable to take clonidine. We will keep the patient on hydralazine 20 mg IV as needed.  4.  Diabetes mellitus. We will hold the Lantus for now as the patient is currently on insulin drip.  5.  Keep the patient on deep vein thrombosis prophylaxis with heparin.   TIME SPENT: 50 minutes.   ____________________________ Monica Becton, MD pv:AT D: 06/29/2014 02:46:24 ET T: 06/29/2014 03:37:48 ET JOB#: [id number]  cc: Monica Becton, MD, <Dictator> Monica Becton MD ELECTRONICALLY SIGNED 06/29/2014 23:02

## 2015-01-19 NOTE — Consult Note (Signed)
Pt with nausea, vomiting, spells of acidosis and ketone type breath.  he has a new spot on liver 3cm in size.  MRI ordered to check for hemangioma versus an abcess or small mass.  He needs to restart and stay on reglan as it seems to help him.  Will follow with you.  Electronic Signatures: Manya Silvas (MD)  (Signed on 22-Sep-15 17:32)  Authored  Last Updated: 22-Sep-15 17:32 by Manya Silvas (MD)

## 2015-01-19 NOTE — Consult Note (Signed)
PATIENT NAME:  [name], [name] MR#:  [id number] DATE OF BIRTH:  26-Dec-1980  DATE OF CONSULTATION:  06/29/2014  REFERRING PHYSICIAN: Max Sane, MD.   CONSULTING PHYSICIAN:  A. Lavone Orn, MD   CHIEF COMPLAINT: Diabetic ketoacidosis.   HISTORY OF PRESENT ILLNESS: This is a 34 year old male with a 20 year history of type 1 diabetes complicated by polyneuropathy, retinopathy, and nephropathy with end-stage renal disease seen in consultation for diabetic ketoacidosis. The patient was admitted last evening with an initial blood sugar of 303, low bicarbonate of 19, elevated anion gap of 19, and elevated beta hydroxybutyrate level consistent with diabetic ketoacidosis. He was initiated on IV fluids and IV insulin. Blood sugars have improved and over the last several hours blood sugars have been 80-98 range. He has been challenging to get venous blood draw from, therefore we do not have a repeat chemistry panel. At this time he remains on IV insulin and IV dextrose.   The patient has had type 1 diabetes since age 75. He developed end-stage renal disease and now does 3 times weekly dialysis. He was hospitalized here from 09/21-09/23 for intractable nausea and vomiting attributed to gastroparesis. He has chronic lower extremity numbness attributed to peripheral neuropathy. Home insulin regimen includes Lantus 9 units at bedtime and Humalog 6 units t.i.d. a.c.  The patient claims to be checking blood sugars regularly. His hemoglobin A1c is 7.1%. Claims he does not have an endocrinologist nor a primary care physician to manage his diabetes. Recently moved to this area from [place], [place]. Cannot recall any physicians that he saw in [place] either. Claims good compliance with medications. At this time he does have nausea, he has increased thirst. Denies recent weight loss.   PAST MEDICAL HISTORY:  1. Type 1 diabetes.  2. Diabetic polyneuropathy.  3. Diabetic nephropathy.  4. Diabetic retinopathy status  post laser eye surgery.  5. End-stage renal disease on hemodialysis.  6. Hypertension.  7. Secondary hyperparathyroidism.  8. Autonomic neuropathy with gastroparesis.   OUTPATIENT MEDICATIONS:  1. Lantus 9 units at bedtime.  2. Humalog 6 units t.i.d. a.c.  3. Sensipar 30 mg daily.  4. Renvela 800 mg p.o. 3 tabs 3 times daily.  5. Reglan 5 mg q.i.d.  6. Pepcid 20 mg every other day.  7. Hydralazine 50 mg twice daily.  8. Clonidine 0.3 mg twice daily.  9. Coreg 25 mg twice daily.  10. Amlodipine 10 mg once daily.   FAMILY HISTORY: Mother has type 2 diabetes mellitus managed with oral medications.   SOCIAL HISTORY: The patient lives with his mother. He denies use of tobacco or alcohol at this time.   REVIEW OF SYSTEMS:  CONSTITUTIONAL:  Denies weight loss. Reports malaise.  HEENT: Denies blurred vision. Reports increased thirst.  NECK: Denies neck pain or dysphagia.  CARDIAC: Denies chest pain or palpitation.  PULMONARY: Denies cough or shortness of breath.  ABDOMEN: He has nausea. No recent emesis. Reports he has mild appetite.  GENITOURINARY: Denies hematuria.   ENDOCRINE: Denies heat or cold intolerance.  SKIN: Denies rash or recent skin changes.  NEUROLOGIC: Reports numbness of the lower extremities. Denies lower extremity paresthesias.   PHYSICAL EXAMINATION:  VITAL SIGNS: Temperature 98.9, pulse 106, respirations 17, blood pressure 156/72.  GENERAL: African-American male, appears chronically unwell.  HEENT: EOMI. Oropharynx is clear.  NECK: Supple. No appreciable thyromegaly.  CARDIAC: Tachycardia without murmur. No carotid bruit.  PULMONARY: Clear to auscultation bilaterally. Good inspiratory effort.  ABDOMEN: Diffusely soft, nontender,  nondistended.  EXTREMITIES: No peripheral edema is present.  SKIN: No rash or skin changes. Multiple tattoos are present.  PSYCHIATRIC: Calm, cooperative.  NEUROLOGIC: Alert and oriented.   LABORATORY DATA: Initial laboratories on  admission include glucose 303, BUN 25, creatinine 5.25, sodium 135, potassium 4.1, chloride 97, bicarbonate 19, anion gap 19. Hemoglobin A1c 7.1%. AST 23, ALT 18. Troponin I less than 0.02. TSH 1.67. Hematocrit 35.9%, WBC 10.9, platelets 263,000.   ASSESSMENT: A 34 year old male with type 1 diabetes complicated by polyneuropathy, retinopathy, nephropathy with end-stage renal disease, admitted with nausea, vomiting, and diabetic ketoacidosis.    PLAN:   1.  Once we are able to obtain a repeat BMP and confirm his anion gap has closed, it would be reasonable to then convert him to subcutaneous insulin and stop the IV dextrose.  2.  Plan to give outpatient Lantus dosing of 9 units daily.  3.  Plan to initiate prandial insulin with a sliding scale correction, however as he has significant nausea may consider giving a half prandial insulin of just 3 units t.i.d. a.c.  3.  As outpatient, the patient typically takes Lantus insulin. A low dose Lantus of just 9 units, would prefer to keep it Lantus rather than switch to in hospital Levemir, if possible. Low dose Levemir may not be a full 24 hour insulin and therefore he would get better glycemic control if we continue his Lantus.  4.  Keep n.p.o. except for water until he is converted to subcutaneous insulin.  5.  The patient will need good outpatient followup as well. I would be happy to arrange that once he has clinically stabilized and ready for discharge.   Thank you for the kind request for consultation. I will follow along with you.      ____________________________ A. Lavone Orn, MD ams:bu D: 06/29/2014 13:20:11 ET T: 06/29/2014 13:44:05 ET JOB#: [id number]  cc: A. Lavone Orn, MD, <Dictator> Sherlon Handing MD ELECTRONICALLY SIGNED 07/09/2014 13:13

## 2015-01-19 NOTE — H&P (Signed)
PATIENT NAME:  [name], [name] MR#:  [id number] DATE OF BIRTH:  [birth year]  DATE OF ADMISSION:  07/27/2014  PRIMARY CARE PHYSICIAN: Nonlocal.    REFERRING PHYSICIAN:  Briant Sites. Joni Fears, MD    CHIEF COMPLAINT:  Nausea, vomiting and diarrhea.    HISTORY OF PRESENT ILLNESS:  [name] is a 34 year old male with a history of diabetes mellitus with end-stage renal disease, gets dialysis Tuesday, Thursday, Saturday, has been doing well until this morning.  Started to have multiple episodes of diarrhea, vomiting.  The patient has been in the Emergency Department for the last 6 hours, had only 1 bowel movement, did not have any episodes of vomiting.  The patient continues to request for Dilaudid, however, even though patient does not seem to be in any distress.  Did not have any fever.  Workup in the Emergency Department, the patient is noted to have blood sugars of 597 with a bicarb of 17, anion gap of 26.  The patient is also found to have elevated white blood cell count of 18,000.  The patient is found to have beta hydroxybutyrate more than 46.    The patient has been having frequent admissions.  This is the fourth admission in the last 60 days.  Denies having any fever.  The patient states has been compliant with his diet and insulin intake.  Per mother, the patient gets frequent episodes of hypoglycemia requiring him to get multiple sugary foods.  The patient has been refusing to do in and out catheterization to rule out if there is any underlying source of any infection, which would be contributing to this DKA.  Has mild cough with no productive sputum.    The patient was seen by endocrinologist in beginning of October 2015 and stated that the patient has hemoglobin A1c of 7.1.    PAST MEDICAL HISTORY:   1.  Type 1 diabetes mellitus.   2.  Diabetic neuropathy.   3.  Diabetic nephropathy.   4.  End-stage renal disease, on hemodialysis Tuesday, Thursday, Saturday.   5.  Diabetic retinopathy.   6.   Hypertension.   7.  Secondary hyperparathyroidism.    PAST SURGICAL HISTORY:   Dialysis catheter placement.    ALLERGIES:  No known drug allergies.    HOME MEDICATIONS:   1.  Sensipar 30 mg daily.   2.  Renvela 800 mg 3 tablets 3 times a day.   3.  Reglan 5 mg 4 times a day.   4.  Pepcid 20 mg once a day.   5.  Lantus 9 units once a day.   6.  Sliding-scale insulin 3 units 3 times a day.   7.  Hydralazine 50 mg  times a day.   8.  Clonidine 0.3 mg times a day.   9.  Coreg 25 mg times a day.   10.  Amlodipine 10 mg once a day.    SOCIAL HISTORY:   No history of smoking, drinking, alcohol or using illicit drugs.  Lives with his mother.    FAMILY HISTORY:   Diabetes mellitus.    REVIEW OF SYSTEMS:  CONSTITUTIONAL: Generalized weakness.  EYES: No change in vision. Has diabetic retinopathy.   EARS, NOSE AND THROAT: No change in hearing.  RESPIRATORY: Mild cough, no shortness of breath.  CARDIOVASCULAR: No chest pain, palpitations.  GASTROINTESTINAL: With nausea, vomiting and diarrhea.   GENITOURINARY: No dysuria or hematuria.  HEMATOLOGIC: No easy bruising or bleeding.  SKIN: Has rash or lesions.  ENDOCRINE:  Has diagnosis of diabetes mellitus.   NEUROLOGIC: No weakness or numbness in any part of the body, however, diabetic neuropathy.    PHYSICAL EXAMINATION:  GENERAL: This is a well-built, well-nourished, obese male laying down in the bed, not in distress.  VITAL SIGNS: Temperature 98.8, pulse 102, blood pressure 189/93, respiratory rate of 24, oxygen saturation is 100% on room air.  HEENT: Head normocephalic, atraumatic. There is no scleral icterus. Conjunctivae normal. Pupils equal and react to light. Mucous membranes mild dryness. No pharyngeal erythema.  NECK: Supple. No lymphadenopathy. No JVD. No carotid bruit.  CHEST: No focal tenderness.  LUNGS: Bilaterally clear to auscultation.  HEART: S1, S2 regular. No murmurs are heard.  ABDOMEN: Bowel sounds plus. Soft,  nontender, nondistended. No hepatosplenomegaly. Could not appreciate any hepatosplenomegaly.   EXTREMITIES: No pedal edema. Pulses 2+.  NEUROLOGIC: The patient is alert, oriented to place, person and time. Cranial nerves II-XII intact. Motor 5/5 in upper and lower extremities.  SKIN:  No rash or lesions.   MUSCULOSKELETAL: Good range of motion in all the extremities.    LABORATORY DATA:  CMP:  Glucose 597, BUN 55, creatinine of 0.75.  Rest of all the values are within normal limits.    CBC:  WBC of 18.4, hemoglobin 10., platelet count of 165.    ASSESSMENT AND PLAN:  [name] comes with nausea, vomiting and diarrhea and is also found to be in diabetic ketoacidosis.   1.  Diabetic ketoacidosis.  Admit the patient to the ICU.  Start the patient on insulin drip.  Follow up with BMP.  When anion gap closes the patient's insulin drip could be discontinued.   2.  End-stage renal disease, on hemodialysis.  Continue with hemodialysis.  Consult nephrology in the morning.   3.  Leukocytosis most likely secondary to diabetic ketoacidosis, however, cannot exclude other infectious cause.  We will obtain a chest x-ray.   4.  Diabetes mellitus reportedly controlled the patient states.  The patient's last hemoglobin A1c per records was 7.1.  Continue with the Levemir.   5.  Hypertension, currently well controlled.   6.  Keep the patient on deep venous thrombosis prophylaxis with heparin.    TIME SPENT:  55 minutes.     ____________________________ Monica Becton, MD pv:AT D: 07/28/2014 00:18:43 ET T: 07/28/2014 01:17:42 ET JOB#: [id number]  cc: Monica Becton, MD, <Dictator> Monica Becton MD ELECTRONICALLY SIGNED 07/28/2014 23:25

## 2015-01-19 NOTE — Discharge Summary (Signed)
PATIENT NAME:  [name], SLICER MR#:  [id number] DATE OF BIRTH:  December 06, 1980  DATE OF ADMISSION:  06/29/2014 DATE OF DISCHARGE:  06/30/2014  PRIMARY CARE PHYSICIAN: Potentially will be Lavera Guise, MD.   ENDOCRINOLOGIST: Sherlon Handing, MD  FINAL DIAGNOSES:  1.  Diabetic ketoacidosis.  2.  End-stage renal disease on hemodialysis Tuesday, Thursday, Saturday.  3.  Epigastric abdominal pain and nausea and vomiting.  4.  Accelerated hypertension.   MEDICATIONS ON DISCHARGE: Include Sensipar 30 mg daily, clonidine 0.3 mg twice a day, hydralazine 50 mg twice a day, Renvela 800 mg 3 tablets 3 times a day, Lantus 9 units subcutaneous injection at bedtime, carvedilol 25 mg twice a day, amlodipine 10 mg daily, Reglan 5 mg 4 times a day before meals and at bedtime, Pepcid 20 mg every other day, insulin aspart 3 units 3 times a day prior to meals.  FOLLOWUP: Follow up with Dr. Gabriel Carina, endocrinology; dialysis at [facility] Tuesday, Thursday, Saturday; in 1-2 weeks your medical doctor.   HOSPITAL COURSE: The patient was admitted 06/29/2014 and discharged 06/30/2014. Came in with nausea, vomiting; was admitted with diabetic ketoacidosis; was started on insulin drip and provided antinausea medications.  LABORATORY AND RADIOLOGICAL DATA DURING THE HOSPITAL COURSE: Included an EKG that showed normal sinus rhythm. Hemoglobin A1c 7.1, beta hydroxybutyrate 4.6. TSH 1.67. Troponin negative. Lipase 93. Glucose 303, BUN 25, creatinine 5.25, sodium 135, potassium 4.1, chloride 97, CO2 of 19, calcium 8.7. Liver function tests normal range. White blood cell count 10.9, H and H 11.7 and 35.9, platelet count of 263,000. Chest and abdomen showed no evidence of bowel obstruction or ileus. No acute cardiopulmonary abnormality seen. Venous pH 7.33. Urinalysis greater than 500 mg/dL of glucose and protein. Hemoglobin upon discharge 10.5.  HOSPITAL COURSE PER PROBLEM LIST:  1.  For the patient's diabetic ketoacidosis, the patient  was started on an insulin drip. Endocrine consultation was done by Dr. Gabriel Carina. She recommended going back on his Lantus insulin low dose, switch the short-acting insulin to aspart. Dr. Gabriel Carina will follow up the patient as outpatient. The patient is unclear of what his trigger was of why he comes in with diabetic ketoacidosis. He states he has all of his insulin supplies and syringes at home.  2.  End-stage renal disease on dialysis. The patient told the nurse that he does not have a nephrologist here. I asked them to give Dr. Keturah Barre number. Dr. Candiss Norse thinks that patient is followed by [facility].  3.  Epigastric pain and nausea, vomiting. This had resolved.  4.  Accelerated hypertension, likely secondary to nausea, vomiting. Up the clonidine. Blood pressure upon discharge 150/78.  TIME SPENT ON DISCHARGE: 35 minutes.    ____________________________ Tana Conch. Leslye Peer, MD rjw:ST D: 07/01/2014 15:09:16 ET T: 07/01/2014 21:33:10 ET JOB#: [id number]  cc: Tana Conch. Leslye Peer, MD, <Dictator> A. Lavone Orn, MD [facility] [place] MD ELECTRONICALLY SIGNED 07/15/2014 12:46

## 2015-01-19 NOTE — H&P (Signed)
PATIENT NAME:  [name], BULLOUGH MR#:  [id number] DATE OF BIRTH:  June 14, 1981  DATE OF ADMISSION:  06/18/2014  PRIMARY DOCTOR:  None.    EMERGENCY ROOM PHYSICIAN: Dr. Archie Balboa.     CHIEF COMPLAINT: Intractable nausea and vomiting.  HISTORY OF PRESENT ILLNESS:  A 34 year old male recently moved from [place] to [place], comes in because of nausea and vomiting. The patient noted to have nausea and vomiting since yesterday. The patient says that his stomach is hurting a little bit. Denies any diarrhea. Complains of mid epigastric pain, nausea, and vomiting since yesterday. The patient noted to have a temperature of 100 degrees Fahrenheit in the Emergency Room. Did not have any throat pain. Did not have any headaches.  The patient received Reglan for nausea in the Emergency Room. The patient is going to be admitted to intractable nausea, vomiting, diabetic gastroparesis flare and acute gastritis. The patient not able to give a clear history and his mother just stepped away.  I tried to call the number that is listed in the chart but it was not going through.  The patient reportedly moved from [place]. He has a history of hypertension, diabetes, ESRD, his last hemodialysis last Saturday, he gets dialysis on Tuesday, Thursday, and Saturday.    PAST MEDICAL HISTORY: As I mentioned, diabetes, hypertension, ESRD.   ALLERGIES: No known allergies.   SOCIAL HISTORY: No smoking. No drinking. No drugs.   PAST SURGICAL HISTORY:  History of dialysis catheter placement.   MEDICATIONS: Amlodipine 10 mg daily, Coreg 25 mg p.o. b.i.d., clonidine 0.3 mg p.o. b.i.d., Humalog 6 units t.i.d. with meals, hydralazine 50 mg p.o. b.i.d., Lantus 9 units at bedtime, Renvela 800 mg p.o. 3 tablets t.i.d., Sensipar 30 mg p.o. daily.   REVIEW OF SYSTEMS:  CONSTITUTIONAL: Feels nauseous and vomiting and also had abdominal pain. PULMONARY: Denies any trouble breathing.  CARDIOVASCULAR: No chest pain. No palpitations.  NEUROLOGIC: No  history of strokes or TIAs.   GENITOURINARY: The patient is on dialysis.  NEUROLOGIC: No strokes.  PSYCHIATRIC: No anxiety or insomnia.  SKIN: No skin rashes.   PHYSICAL EXAMINATION:  VITAL SIGNS: Temperature initially was 98.5, later on 100 Fahrenheit, heart rate 89, blood pressure 224/110, saturation is 94% on room air.  GENERAL: Alert, awake, oriented, in slight distress because of constant nausea.  HEAD: Atraumatic, normocephalic.  EYES: Pupils equal, reacting to light. Extraocular movements are intact.  ENT: No tympanic membrane congestion. No turbinate hypertrophy. No oropharyngeal erythema.  NECK: Supple. No JVD. No carotid bruit.   CARDIOVASCULAR: S1, S2 regular. No murmurs. PMI not displaced. No extremity edema. Pedal pulses are intact in dorsa;id pedis and femoral artery,, .  RESPIRATORY: Bilateral breath sounds present. No wheeze. No rales.  GASTROINTESTINAL:  The patient does have epigastric tenderness present. No rebound tenderness. No hernias. No organomegaly.  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 No extremity edema. No cyanosis.  No clubbing.  BACK: No CVA tenderness.  SKIN: Inspection is normal, well-hydrated.  NEUROLOGIC: Cranial nerves II-XII are intact. Power 5 out of 5 upper and lower extremities.  Sensation is intact. DTRs 2 + bilaterally.  PSYCHIATRIC: Mood and affect are within normal limits.   LABORATORY DATA:  1.  WBC 14.2, hemoglobin 11.8, hematocrit 37.2, platelets 161,000. Lipase 137. Troponin less than 0.02.  Electrolytes, as mentioned sodium 131, potassium 4.5, chloride 94, BUN 48, creatinine 9.72. LFTs, alkaline phosphatase is 140.  2.  Chest x-ray shows no acute abnormality.  ASSESSMENT AND PLAN:  35.  A 34 year old male with epigastric pain, nausea, vomiting, likely secondary to acute gastritis versus diabetic gastroparesis flare. Admit him to hospitalist service, start him on IV fluids along with IV Reglan,  Zofran, and also IV Protonix.  2.  The patient has end-stage renal disease on hemodialysis. He had his routine dialysis on Saturday and we will consult nephrology for his routine dialysis.  3.  Slightly elevated alkaline phosphatase of unclear significance. Re-check alkaline phosphatase and other LFTs tomorrow.  4.  Hypertension. Blood pressure is very elevated in the ER, has malignant hypertension. Continue hydralazine IV along with IV labetalol. I also started the p.o. medications, but I am not sure if he is going to take because he has nausea and vomiting.  5.  Type 2 diabetes mellitus. He is on sliding scale with coverage and is on basal Lantus at 9 units at bedtime.  6.  Hyperglycemia. The patient's sugars were 436, but his anion gap is 13.  7.  Time spent is 55 minutes. I consulted nephrology regarding his hemodialysis.    ____________________________ Epifanio Lesches, MD sk:bu D: 06/18/2014 17:39:05 ET T: 06/18/2014 19:23:28 ET JOB#: [id number]  cc: Epifanio Lesches, MD, <Dictator> Epifanio Lesches MD ELECTRONICALLY SIGNED 07/07/2014 22:48

## 2015-01-19 NOTE — H&P (Signed)
PATIENT NAME:  [name], [name] MR#:  [id number] DATE OF BIRTH:  10/17/1980  DATE OF ADMISSION:  08/19/2014  PRIMARY CARE PHYSICIAN: Not local.  REFERRING PHYSICIAN: Yetta Numbers. Karma Greaser, MD   CHIEF COMPLAINT: Abdominal pain, nausea, vomiting today.   HISTORY OF PRESENT ILLNESS: A 34 year old [place] American male with a history of DKA, diabetes, diabetic neuropathy, ESRD who presented to the ED with the above chief complaint. The patient is alert, awake, oriented. According to the patient, the patient started to have abdominal pain, nausea, vomiting today. Abdominal pain is in the epigastric area, sharp, 9/10, constant. The patient denies any fever, chills; denies any diarrhea, melena, or bloody stool. The patient was noted to have high sugar at 426 with anion gap at 18.   PAST MEDICAL HISTORY: Diabetes 1; diabetic neuropathy; diabetic nephropathy; ESRD on hemodialysis Tuesday, Thursday, Saturday; diabetic retinopathy; hypertension; secondary hyperparathyroidism.   PAST SURGICAL HISTORY: Dialysis catheter placement.   FAMILY HISTORY: Diabetes.   SOCIAL HISTORY: No smoking or drinking or illicit drugs.   ALLERGIES: No.   HOME MEDICATIONS: Renvela carbonate 800 mg 1 tablet t.i.d.; pantoprazole 20 mg p.o. daily; Reglan 5 mg/5 mL, 5 mL 4 times a day before meals and at bedtime; lisinopril 10 mg p.o. 2 tablets once a day; insulin lispro 80 units subcutaneous t.i.d. before meals; Lantus 12 units subcutaneous once a day at bedtime; hydralazine 50 mg p.o. b.i.d.; clonidine 0.3 mg p.o. b.i.d.; Coreg 25 mg p.o. b.i.d.; Norvasc 10 mg p.o. daily.  REVIEW OF SYSTEMS:  CONSTITUTIONAL: The patient denies any fever or chills. No headache or dizziness, but has weakness.  EYES: No double vision or blurry vision.  ENT: No postnasal drip, slurred speech, or dysphagia.  CARDIOVASCULAR: No chest pain, palpitation, orthopnea, or nocturnal dyspnea. No leg edema.  PULMONARY: No cough, sputum, shortness of breath, or  hemoptysis.  GASTROINTESTINAL: Positive for abdominal pain, nausea, vomiting, but no diarrhea, melena, or bloody stool. GENITOURINARY: No dysuria, hematuria, or incontinence.  SKIN: No rash or jaundice.  NEUROLOGY: No syncope, loss of consciousness, or seizure.  ENDOCRINOLOGY: No polyuria, polydipsia, heat or cold intolerance.  HEMATOLOGY: No easy bruising or bleeding.   PHYSICAL EXAMINATION:  VITAL SIGNS: Temperature 98.8, blood pressure 204/111, pulse 94, oxygen saturation 97% on room air.  GENERAL: The patient is alert, awake, oriented, in no acute distress.  HEENT: Pupils round, equal and reactive to light and accommodation. Dry oral mucosa. Clear oropharynx.  NECK: Supple. No JVD or carotid bruit. No lymphadenopathy. No thyromegaly. CARDIOVASCULAR: S1 and S2, regular rate and rhythm. No murmurs or gallops.  PULMONARY: Bilateral air entry. No wheezing or rales. No use of accessory muscles to breathe.  ABDOMEN: Soft. Tenderness in the epigastric area. No rigidity. No rebound. No organomegaly. Bowel sounds present.  EXTREMITIES: No edema, clubbing, or cyanosis. No calf tenderness. Bilateral pedal pulses present.  SKIN: No rash or jaundice.  NEUROLOGIC: A and O x 3. No focal deficit. Power 5/5. Sensory intact.   LABORATORY DATA: Glucose 426, BUN 20, creatinine 5.83, sodium 138, potassium 4.0, chloride 92, bicarbonate 20, SGOT 14, SGPT 20, alkaline phosphatase 121, anion gap 18. WBC 12.2, hemoglobin 10.6, platelets 190,000. ABG showed pH of 7.32, pCO2 of 36, PaO2 of 65, bicarbonate 18.5, lactic acid 1.2.   IMPRESSION:  1.  Diabetic ketoacidosis. 2.  Diabetes 1.  3.  Hypertension malignancy.  4.  End-stage renal disease on hemodialysis.   PLAN OF TREATMENT:  1.  The patient will be admitted to CCU. We  will start an insulin drip and DKA protocol. Hold the patient's home insulin and Lantus. Give IV fluid support and hypoglycemia protocol.  2.  For hypertension, we will continue the  patient's home hypertension medication with hydralazine IV p.r.n.  3.  For ESRD, we will request a nephrology consult to continue hemodialysis.  I discussed the patient's condition and plan of treatment with the patient.  CRITICAL TIME SPENT: About 62 minutes.    ____________________________ Demetrios Loll, MD qc:ST D: 08/19/2014 22:52:57 ET T: 08/20/2014 00:08:04 ET JOB#: [id number]  cc: Demetrios Loll, MD, <Dictator> Demetrios Loll MD ELECTRONICALLY SIGNED 08/20/2014 14:49

## 2015-01-19 NOTE — Discharge Summary (Signed)
PATIENT NAME:  [name], [name] MR#:  [id number] DATE OF BIRTH:  Sep 05, 1981  DATE OF ADMISSION:  07/27/2014 DATE OF DISCHARGE:  07/31/2014  DISCHARGE DIAGNOSES:   1.  DKA   2.  Abdominal pain due to colitis.   3.  Acute gastritis.   DISCHARGE MEDICATIONS:  1.  Sensipar 30 mg p.o. daily.  2.  Clonidine 0.2 mg p.o. b.i.d.  3.  Hydralazine 50 mg p.o. b.i.d.   4.  Renvela 800 mg p.o. 3 tablets t.i.d. with meals.  5.  Lantus 9 units at bedtime.  6.  Coreg 25 mg p.o. b.i.d.   7.  Amlodipine 10 mg daily.  8.  Reglan 5 mg 4 times daily before meals and at bedtime for nausea and vomiting.  9.  Pepcid 20 mg every other day.  10.  Insulin NovoLog 3 units t.i.d. with meals.   11.  Cipro 500 mg p.o. b.i.d.  12.  Flagyl 500 mg every 8 hours p.o.  13.  Percocet 5/325 mg every 6 hours as needed.   14.  He is given Cipro for 10 days and Flagyl for 10 days.   CONSULTATIONS: Nephrology consult for dialysis. The patient has ESRD and he gets dialysis Tuesday, Thursday and Saturday.   HOSPITAL COURSE:  1.  A 34 year old male patient who came in because of nausea, vomiting, diarrhea. The patient also had abdominal pain. His sugars were 597, anion gap of 26, bicarb 17, and white count 18,000. The patient admitted to ICU for DKA and started on insulin drip, IV fluids and IV PPIs.   His symptoms improved nicely and anion gap decreased to 15 with IV fluids. The patient moved from ICU.  We transitioned from insulin to sliding scale coverage. The patient persistently had nausea, vomiting, abdominal pain. Refused repeat blood work to look at white count and refused contrast for the CAT scan. I examined him. I felt like he might have colitis because of nausea, vomiting, abdominal pain. The patient's abdominal CAT scan is done without contrast and CT abdomen showed diffuse wall thickening of the colon and possible colitis. The patient was started on IV Cipro, Flagyl and the patient's symptoms improved the next day. He was  able to tolerate the diet, did not have abdominal pain or nausea and the patient wanted to go home and the patient's white count actually is rechecked again on November 3, it was 9.3 and he felt much better, anion gap decreased to 14. He was seen by nephrologist during the last admission started him on Lantus and also NovoLog.  2.  Regarding colitis, he is given Cipro and Flagyl to take and finish it off.  3.  End-stage renal disease, on hemodialysis Tuesday, Thursday, Saturday. He did receive dialysis on October 31 and November 3.  The patient requested pain medicine prescription. We gave him Percocet for 5 days at 5/325 mg, 20 tablets are given. The patient's blood sugars improved and he has Lantus that he takes and other medications for his diabetes and hypertension.   PHYSICAL EXAMINATION ON DAY OF DISCHARGE:   CARDIOVASCULAR: S1, S2 regular.   LUNGS:  Clear to auscultation.   ABDOMEN: Soft, nontender, nondistended. Bowel sounds present.  VITAL SIGNS: discharge vitals ;temperature 98.f, heart rate 87, blood pressure 156/86, saturations 97% on room air.   CONDITION: Stable at the time of discharge.   TIME SPENT ON DISCHARGE PREPARATION: More than 30 minutes.   DISPOSITION: Discharged home in stable condition.    ____________________________  Epifanio Lesches, MD sk:AT D: 08/03/2014 17:18:44 ET T: 08/04/2014 02:36:13 ET JOB#: [id number]  cc: Epifanio Lesches, MD, <Dictator> Epifanio Lesches MD ELECTRONICALLY SIGNED 08/16/2014 8:28

## 2015-01-19 NOTE — Discharge Summary (Signed)
PATIENT NAME:  [name], [name] MR#:  [id number] DATE OF BIRTH:  [birth year]  DATE OF ADMISSION:  08/19/2014 DATE OF DISCHARGE:  08/22/2014  DISCHARGE DIAGNOSES: 1. Diabetic ketoacidosis, resolved.  2. End-stage renal disease on hemodialysis.  3. Malignant hypertension.  4. Diabetes mellitus, type 1.  5. Diabetic peripheral neuropathy.  6. Diabetic nephropathy.   CONSULTATIONS: Dr. Murlean Iba, nephrology.   PROCEDURES: The patient received hemodialysis on his Tuesday, Thursday, Saturday schedule with the exception of an early dialysis on Wednesday, November 25.   HISTORY OF PRESENT ILLNESS: This 34 year old, African American man, with a history of diabetes mellitus type 1 with diabetic neuropathy, end-stage renal disease, presents to the Emergency Room with abdominal pain, nausea and vomiting. He was found to be in DKA with blood sugar of 26 and anion gap of 18.   HOSPITAL COURSE BY PROBLEM:  1. DKA. The patient was admitted to the critical care unit. Insulin drip was started per DKA protocol. He was given IV fluid support. He did well, and within 24 hours, was out of DKA. He was then started on basal insulin and sliding scale. He did have persistent nausea and vomiting, likely due to diabetic gastroparesis for which he takes Reglan. This improved through the hospitalization, and, at the time of discharge, blood sugars are controlled. He is no longer acidotic and he is tolerating a regular diet.  2. End-stage renal disease on hemodialysis: The patient was seen by nephrology throughout the hospitalization and was kept on his Tuesday, Thursday, Saturday schedule with the exception of 1 early dialysis, November 25.  3. Malignant hypertension: The patient had fairly high blood pressures throughout the hospitalization despite hemodialysis. We did change his medications by stopping lisinopril and adding losartan 160 mg 1 tablet at bedtime. He will follow up with nephrology for further medication  adjustments to improve his blood pressure. We also increased his hydralazine to 50 mg 3 times a day.  4. Nausea and vomiting: Reglan has helped with these symptoms. DKA also likely triggered some of his nausea and vomiting. At the time of discharge, he is tolerating a regular diet.  5. Anxiety: During the hospitalization, the patient requested medication for anxiety. He was started on an SSRI and this should be followed up by his primary care physician. 6. Diabetes mellitus type 1: The patient presented in DKA with uncontrolled blood sugars. His hemoglobin A1c was checked and found to be 5.8. Blood sugars are well controlled at home usually. His standing dose of t.i.d. short-acting insulin was actually decreased due to very well-controlled blood sugars in the hospital. He will need to follow this very closely. He does not currently have an endocrinologist, but has seen Dr. Gabriel Carina in the past and plans to follow up with her.    DISCHARGE MEDICATIONS:  1. Clonidine 0.3 mg 1 tablet twice a day.  2. Carvedilol 25 mg 1 tablet twice a day.  3. Amlodipine 10 mg 1 tablet once a day.  4. Insulin glargine 100 units/mL subcutaneous solution 12 units once a day at bedtime.  5. Renvela carbonate 800 mg 1 tablet 3 times a day with meals.  6. Pantoprazole 20 mg 1 tablet once a day.  7. Metoclopramide 5 mg/5 mL oral syrup 5 mL orally 4 times a day before meals and at bedtime.  8. Hydralazine 50 mg 1 tablet 3 times a day.  9. Insulin lispro 100 units/mL subcutaneously 4 units 3 times a day before meals.  10. Valsartan 160 mg  1 tablet once a day at bedtime.  11. Sertraline 50 mg 1 tablet once a day.   DISCHARGE PHYSICAL EXAMINATION:  VITAL SIGNS: Temperature 98.5, pulse 92, respirations 18, blood pressure 160/90, oxygenation 96% on room air.  GENERAL: No acute distress.  PULMONARY: Lungs are clear to auscultation bilaterally with good air movement.  CARDIOVASCULAR: Regular rate and rhythm, no murmurs, rubs or  gallops. No peripheral edema. Peripheral pulses are 2+.  PSYCHIATRIC: The patient is alert and oriented x 4 with no signs of uncontrolled anxiety or depression.   LABORATORY DATA: Sodium 138, potassium 4.0, chloride 100, bicarbonate 30, creatinine 5, BUN 14, A1c 5.8. White blood cells 7.2, hemoglobin 9.7, platelets 158,000. MCV 97. Blood glucose 114, 247.   CONDITION ON DISCHARGE: Stable.   DISPOSITION: The patient is discharged to home.   DISCHARGE INSTRUCTIONS:  DIET: Heart-healthy, carbohydrate-controlled diet.   ACTIVITY: No restrictions.   TIMEFRAME FOR FOLLOW-UP: Follow up with your primary care physician in the next 1 to 2 weeks and please keep your dialysis schedule.   TIME SPENT ON DISCHARGE: 40 minutes.    ____________________________ Earleen Newport. Volanda Napoleon, MD cpw:JT D: 08/27/2014 10:34:45 ET T: 08/27/2014 12:16:18 ET JOB#: [id number]  cc: Barnetta Chapel P. Volanda Napoleon, MD, <Dictator> Aldean Jewett MD ELECTRONICALLY SIGNED 09/01/2014 11:52

## 2015-01-22 DIAGNOSIS — D631 Anemia in chronic kidney disease: Secondary | ICD-10-CM | POA: Diagnosis not present

## 2015-01-22 DIAGNOSIS — N186 End stage renal disease: Secondary | ICD-10-CM | POA: Diagnosis not present

## 2015-01-22 DIAGNOSIS — E1129 Type 2 diabetes mellitus with other diabetic kidney complication: Secondary | ICD-10-CM | POA: Diagnosis not present

## 2015-01-22 DIAGNOSIS — N2581 Secondary hyperparathyroidism of renal origin: Secondary | ICD-10-CM | POA: Diagnosis not present

## 2015-01-24 DIAGNOSIS — N2581 Secondary hyperparathyroidism of renal origin: Secondary | ICD-10-CM | POA: Diagnosis not present

## 2015-01-24 DIAGNOSIS — N186 End stage renal disease: Secondary | ICD-10-CM | POA: Diagnosis not present

## 2015-01-24 DIAGNOSIS — D631 Anemia in chronic kidney disease: Secondary | ICD-10-CM | POA: Diagnosis not present

## 2015-01-24 DIAGNOSIS — E1129 Type 2 diabetes mellitus with other diabetic kidney complication: Secondary | ICD-10-CM | POA: Diagnosis not present

## 2015-01-26 DIAGNOSIS — N2581 Secondary hyperparathyroidism of renal origin: Secondary | ICD-10-CM | POA: Diagnosis not present

## 2015-01-26 DIAGNOSIS — E1129 Type 2 diabetes mellitus with other diabetic kidney complication: Secondary | ICD-10-CM | POA: Diagnosis not present

## 2015-01-26 DIAGNOSIS — Z992 Dependence on renal dialysis: Secondary | ICD-10-CM | POA: Diagnosis not present

## 2015-01-26 DIAGNOSIS — I120 Hypertensive chronic kidney disease with stage 5 chronic kidney disease or end stage renal disease: Secondary | ICD-10-CM | POA: Diagnosis not present

## 2015-01-26 DIAGNOSIS — N186 End stage renal disease: Secondary | ICD-10-CM | POA: Diagnosis not present

## 2015-01-26 DIAGNOSIS — D631 Anemia in chronic kidney disease: Secondary | ICD-10-CM | POA: Diagnosis not present

## 2015-01-29 DIAGNOSIS — N2581 Secondary hyperparathyroidism of renal origin: Secondary | ICD-10-CM | POA: Diagnosis not present

## 2015-01-29 DIAGNOSIS — E1129 Type 2 diabetes mellitus with other diabetic kidney complication: Secondary | ICD-10-CM | POA: Diagnosis not present

## 2015-01-29 DIAGNOSIS — N186 End stage renal disease: Secondary | ICD-10-CM | POA: Diagnosis not present

## 2015-01-29 DIAGNOSIS — D631 Anemia in chronic kidney disease: Secondary | ICD-10-CM | POA: Diagnosis not present

## 2015-01-31 DIAGNOSIS — D631 Anemia in chronic kidney disease: Secondary | ICD-10-CM | POA: Diagnosis not present

## 2015-01-31 DIAGNOSIS — N2581 Secondary hyperparathyroidism of renal origin: Secondary | ICD-10-CM | POA: Diagnosis not present

## 2015-01-31 DIAGNOSIS — N186 End stage renal disease: Secondary | ICD-10-CM | POA: Diagnosis not present

## 2015-01-31 DIAGNOSIS — E1129 Type 2 diabetes mellitus with other diabetic kidney complication: Secondary | ICD-10-CM | POA: Diagnosis not present

## 2015-02-02 DIAGNOSIS — E1129 Type 2 diabetes mellitus with other diabetic kidney complication: Secondary | ICD-10-CM | POA: Diagnosis not present

## 2015-02-02 DIAGNOSIS — D631 Anemia in chronic kidney disease: Secondary | ICD-10-CM | POA: Diagnosis not present

## 2015-02-02 DIAGNOSIS — N186 End stage renal disease: Secondary | ICD-10-CM | POA: Diagnosis not present

## 2015-02-02 DIAGNOSIS — N2581 Secondary hyperparathyroidism of renal origin: Secondary | ICD-10-CM | POA: Diagnosis not present

## 2015-02-05 DIAGNOSIS — N186 End stage renal disease: Secondary | ICD-10-CM | POA: Diagnosis not present

## 2015-02-05 DIAGNOSIS — N2581 Secondary hyperparathyroidism of renal origin: Secondary | ICD-10-CM | POA: Diagnosis not present

## 2015-02-05 DIAGNOSIS — D631 Anemia in chronic kidney disease: Secondary | ICD-10-CM | POA: Diagnosis not present

## 2015-02-05 DIAGNOSIS — E1129 Type 2 diabetes mellitus with other diabetic kidney complication: Secondary | ICD-10-CM | POA: Diagnosis not present

## 2015-02-07 DIAGNOSIS — N2581 Secondary hyperparathyroidism of renal origin: Secondary | ICD-10-CM | POA: Diagnosis not present

## 2015-02-07 DIAGNOSIS — D631 Anemia in chronic kidney disease: Secondary | ICD-10-CM | POA: Diagnosis not present

## 2015-02-07 DIAGNOSIS — N186 End stage renal disease: Secondary | ICD-10-CM | POA: Diagnosis not present

## 2015-02-07 DIAGNOSIS — E1129 Type 2 diabetes mellitus with other diabetic kidney complication: Secondary | ICD-10-CM | POA: Diagnosis not present

## 2015-02-09 DIAGNOSIS — N2581 Secondary hyperparathyroidism of renal origin: Secondary | ICD-10-CM | POA: Diagnosis not present

## 2015-02-09 DIAGNOSIS — D631 Anemia in chronic kidney disease: Secondary | ICD-10-CM | POA: Diagnosis not present

## 2015-02-09 DIAGNOSIS — E1129 Type 2 diabetes mellitus with other diabetic kidney complication: Secondary | ICD-10-CM | POA: Diagnosis not present

## 2015-02-09 DIAGNOSIS — N186 End stage renal disease: Secondary | ICD-10-CM | POA: Diagnosis not present

## 2015-02-12 DIAGNOSIS — N2581 Secondary hyperparathyroidism of renal origin: Secondary | ICD-10-CM | POA: Diagnosis not present

## 2015-02-12 DIAGNOSIS — E1129 Type 2 diabetes mellitus with other diabetic kidney complication: Secondary | ICD-10-CM | POA: Diagnosis not present

## 2015-02-12 DIAGNOSIS — N186 End stage renal disease: Secondary | ICD-10-CM | POA: Diagnosis not present

## 2015-02-12 DIAGNOSIS — D631 Anemia in chronic kidney disease: Secondary | ICD-10-CM | POA: Diagnosis not present

## 2015-02-14 DIAGNOSIS — N186 End stage renal disease: Secondary | ICD-10-CM | POA: Diagnosis not present

## 2015-02-14 DIAGNOSIS — N2581 Secondary hyperparathyroidism of renal origin: Secondary | ICD-10-CM | POA: Diagnosis not present

## 2015-02-14 DIAGNOSIS — D631 Anemia in chronic kidney disease: Secondary | ICD-10-CM | POA: Diagnosis not present

## 2015-02-14 DIAGNOSIS — E1129 Type 2 diabetes mellitus with other diabetic kidney complication: Secondary | ICD-10-CM | POA: Diagnosis not present

## 2015-02-16 DIAGNOSIS — N186 End stage renal disease: Secondary | ICD-10-CM | POA: Diagnosis not present

## 2015-02-16 DIAGNOSIS — E1129 Type 2 diabetes mellitus with other diabetic kidney complication: Secondary | ICD-10-CM | POA: Diagnosis not present

## 2015-02-16 DIAGNOSIS — D631 Anemia in chronic kidney disease: Secondary | ICD-10-CM | POA: Diagnosis not present

## 2015-02-16 DIAGNOSIS — N2581 Secondary hyperparathyroidism of renal origin: Secondary | ICD-10-CM | POA: Diagnosis not present

## 2015-02-19 DIAGNOSIS — D631 Anemia in chronic kidney disease: Secondary | ICD-10-CM | POA: Diagnosis not present

## 2015-02-19 DIAGNOSIS — N2581 Secondary hyperparathyroidism of renal origin: Secondary | ICD-10-CM | POA: Diagnosis not present

## 2015-02-19 DIAGNOSIS — E1129 Type 2 diabetes mellitus with other diabetic kidney complication: Secondary | ICD-10-CM | POA: Diagnosis not present

## 2015-02-19 DIAGNOSIS — N186 End stage renal disease: Secondary | ICD-10-CM | POA: Diagnosis not present

## 2015-02-21 DIAGNOSIS — N186 End stage renal disease: Secondary | ICD-10-CM | POA: Diagnosis not present

## 2015-02-21 DIAGNOSIS — D631 Anemia in chronic kidney disease: Secondary | ICD-10-CM | POA: Diagnosis not present

## 2015-02-21 DIAGNOSIS — E1129 Type 2 diabetes mellitus with other diabetic kidney complication: Secondary | ICD-10-CM | POA: Diagnosis not present

## 2015-02-21 DIAGNOSIS — N2581 Secondary hyperparathyroidism of renal origin: Secondary | ICD-10-CM | POA: Diagnosis not present

## 2015-02-23 DIAGNOSIS — N2581 Secondary hyperparathyroidism of renal origin: Secondary | ICD-10-CM | POA: Diagnosis not present

## 2015-02-23 DIAGNOSIS — E1129 Type 2 diabetes mellitus with other diabetic kidney complication: Secondary | ICD-10-CM | POA: Diagnosis not present

## 2015-02-23 DIAGNOSIS — N186 End stage renal disease: Secondary | ICD-10-CM | POA: Diagnosis not present

## 2015-02-23 DIAGNOSIS — D631 Anemia in chronic kidney disease: Secondary | ICD-10-CM | POA: Diagnosis not present

## 2015-02-26 DIAGNOSIS — N186 End stage renal disease: Secondary | ICD-10-CM | POA: Diagnosis not present

## 2015-02-26 DIAGNOSIS — Z992 Dependence on renal dialysis: Secondary | ICD-10-CM | POA: Diagnosis not present

## 2015-02-26 DIAGNOSIS — N2581 Secondary hyperparathyroidism of renal origin: Secondary | ICD-10-CM | POA: Diagnosis not present

## 2015-02-26 DIAGNOSIS — I120 Hypertensive chronic kidney disease with stage 5 chronic kidney disease or end stage renal disease: Secondary | ICD-10-CM | POA: Diagnosis not present

## 2015-02-26 DIAGNOSIS — E1129 Type 2 diabetes mellitus with other diabetic kidney complication: Secondary | ICD-10-CM | POA: Diagnosis not present

## 2015-02-26 DIAGNOSIS — D631 Anemia in chronic kidney disease: Secondary | ICD-10-CM | POA: Diagnosis not present

## 2015-02-28 DIAGNOSIS — D509 Iron deficiency anemia, unspecified: Secondary | ICD-10-CM | POA: Diagnosis not present

## 2015-02-28 DIAGNOSIS — E1129 Type 2 diabetes mellitus with other diabetic kidney complication: Secondary | ICD-10-CM | POA: Diagnosis not present

## 2015-02-28 DIAGNOSIS — N2581 Secondary hyperparathyroidism of renal origin: Secondary | ICD-10-CM | POA: Diagnosis not present

## 2015-02-28 DIAGNOSIS — N186 End stage renal disease: Secondary | ICD-10-CM | POA: Diagnosis not present

## 2015-02-28 DIAGNOSIS — D631 Anemia in chronic kidney disease: Secondary | ICD-10-CM | POA: Diagnosis not present

## 2015-03-02 DIAGNOSIS — E1129 Type 2 diabetes mellitus with other diabetic kidney complication: Secondary | ICD-10-CM | POA: Diagnosis not present

## 2015-03-02 DIAGNOSIS — N2581 Secondary hyperparathyroidism of renal origin: Secondary | ICD-10-CM | POA: Diagnosis not present

## 2015-03-02 DIAGNOSIS — N186 End stage renal disease: Secondary | ICD-10-CM | POA: Diagnosis not present

## 2015-03-02 DIAGNOSIS — D509 Iron deficiency anemia, unspecified: Secondary | ICD-10-CM | POA: Diagnosis not present

## 2015-03-02 DIAGNOSIS — D631 Anemia in chronic kidney disease: Secondary | ICD-10-CM | POA: Diagnosis not present

## 2015-03-05 DIAGNOSIS — D509 Iron deficiency anemia, unspecified: Secondary | ICD-10-CM | POA: Diagnosis not present

## 2015-03-05 DIAGNOSIS — N2581 Secondary hyperparathyroidism of renal origin: Secondary | ICD-10-CM | POA: Diagnosis not present

## 2015-03-05 DIAGNOSIS — D631 Anemia in chronic kidney disease: Secondary | ICD-10-CM | POA: Diagnosis not present

## 2015-03-05 DIAGNOSIS — N186 End stage renal disease: Secondary | ICD-10-CM | POA: Diagnosis not present

## 2015-03-05 DIAGNOSIS — E1129 Type 2 diabetes mellitus with other diabetic kidney complication: Secondary | ICD-10-CM | POA: Diagnosis not present

## 2015-03-07 DIAGNOSIS — N186 End stage renal disease: Secondary | ICD-10-CM | POA: Diagnosis not present

## 2015-03-07 DIAGNOSIS — E1129 Type 2 diabetes mellitus with other diabetic kidney complication: Secondary | ICD-10-CM | POA: Diagnosis not present

## 2015-03-07 DIAGNOSIS — D509 Iron deficiency anemia, unspecified: Secondary | ICD-10-CM | POA: Diagnosis not present

## 2015-03-07 DIAGNOSIS — N2581 Secondary hyperparathyroidism of renal origin: Secondary | ICD-10-CM | POA: Diagnosis not present

## 2015-03-07 DIAGNOSIS — D631 Anemia in chronic kidney disease: Secondary | ICD-10-CM | POA: Diagnosis not present

## 2015-03-09 DIAGNOSIS — E1129 Type 2 diabetes mellitus with other diabetic kidney complication: Secondary | ICD-10-CM | POA: Diagnosis not present

## 2015-03-09 DIAGNOSIS — D509 Iron deficiency anemia, unspecified: Secondary | ICD-10-CM | POA: Diagnosis not present

## 2015-03-09 DIAGNOSIS — N2581 Secondary hyperparathyroidism of renal origin: Secondary | ICD-10-CM | POA: Diagnosis not present

## 2015-03-09 DIAGNOSIS — D631 Anemia in chronic kidney disease: Secondary | ICD-10-CM | POA: Diagnosis not present

## 2015-03-09 DIAGNOSIS — N186 End stage renal disease: Secondary | ICD-10-CM | POA: Diagnosis not present

## 2015-03-12 DIAGNOSIS — N2581 Secondary hyperparathyroidism of renal origin: Secondary | ICD-10-CM | POA: Diagnosis not present

## 2015-03-12 DIAGNOSIS — D509 Iron deficiency anemia, unspecified: Secondary | ICD-10-CM | POA: Diagnosis not present

## 2015-03-12 DIAGNOSIS — E1129 Type 2 diabetes mellitus with other diabetic kidney complication: Secondary | ICD-10-CM | POA: Diagnosis not present

## 2015-03-12 DIAGNOSIS — D631 Anemia in chronic kidney disease: Secondary | ICD-10-CM | POA: Diagnosis not present

## 2015-03-12 DIAGNOSIS — N186 End stage renal disease: Secondary | ICD-10-CM | POA: Diagnosis not present

## 2015-03-14 DIAGNOSIS — N186 End stage renal disease: Secondary | ICD-10-CM | POA: Diagnosis not present

## 2015-03-14 DIAGNOSIS — D631 Anemia in chronic kidney disease: Secondary | ICD-10-CM | POA: Diagnosis not present

## 2015-03-14 DIAGNOSIS — D509 Iron deficiency anemia, unspecified: Secondary | ICD-10-CM | POA: Diagnosis not present

## 2015-03-14 DIAGNOSIS — N2581 Secondary hyperparathyroidism of renal origin: Secondary | ICD-10-CM | POA: Diagnosis not present

## 2015-03-14 DIAGNOSIS — E1129 Type 2 diabetes mellitus with other diabetic kidney complication: Secondary | ICD-10-CM | POA: Diagnosis not present

## 2015-03-16 DIAGNOSIS — D631 Anemia in chronic kidney disease: Secondary | ICD-10-CM | POA: Diagnosis not present

## 2015-03-16 DIAGNOSIS — D509 Iron deficiency anemia, unspecified: Secondary | ICD-10-CM | POA: Diagnosis not present

## 2015-03-16 DIAGNOSIS — N186 End stage renal disease: Secondary | ICD-10-CM | POA: Diagnosis not present

## 2015-03-16 DIAGNOSIS — N2581 Secondary hyperparathyroidism of renal origin: Secondary | ICD-10-CM | POA: Diagnosis not present

## 2015-03-16 DIAGNOSIS — E1129 Type 2 diabetes mellitus with other diabetic kidney complication: Secondary | ICD-10-CM | POA: Diagnosis not present

## 2015-03-19 DIAGNOSIS — E1129 Type 2 diabetes mellitus with other diabetic kidney complication: Secondary | ICD-10-CM | POA: Diagnosis not present

## 2015-03-19 DIAGNOSIS — N2581 Secondary hyperparathyroidism of renal origin: Secondary | ICD-10-CM | POA: Diagnosis not present

## 2015-03-19 DIAGNOSIS — N186 End stage renal disease: Secondary | ICD-10-CM | POA: Diagnosis not present

## 2015-03-19 DIAGNOSIS — D631 Anemia in chronic kidney disease: Secondary | ICD-10-CM | POA: Diagnosis not present

## 2015-03-19 DIAGNOSIS — D509 Iron deficiency anemia, unspecified: Secondary | ICD-10-CM | POA: Diagnosis not present

## 2015-03-21 DIAGNOSIS — E1129 Type 2 diabetes mellitus with other diabetic kidney complication: Secondary | ICD-10-CM | POA: Diagnosis not present

## 2015-03-21 DIAGNOSIS — D631 Anemia in chronic kidney disease: Secondary | ICD-10-CM | POA: Diagnosis not present

## 2015-03-21 DIAGNOSIS — D509 Iron deficiency anemia, unspecified: Secondary | ICD-10-CM | POA: Diagnosis not present

## 2015-03-21 DIAGNOSIS — N2581 Secondary hyperparathyroidism of renal origin: Secondary | ICD-10-CM | POA: Diagnosis not present

## 2015-03-21 DIAGNOSIS — N186 End stage renal disease: Secondary | ICD-10-CM | POA: Diagnosis not present

## 2015-03-23 DIAGNOSIS — D509 Iron deficiency anemia, unspecified: Secondary | ICD-10-CM | POA: Diagnosis not present

## 2015-03-23 DIAGNOSIS — D631 Anemia in chronic kidney disease: Secondary | ICD-10-CM | POA: Diagnosis not present

## 2015-03-23 DIAGNOSIS — N186 End stage renal disease: Secondary | ICD-10-CM | POA: Diagnosis not present

## 2015-03-23 DIAGNOSIS — E1129 Type 2 diabetes mellitus with other diabetic kidney complication: Secondary | ICD-10-CM | POA: Diagnosis not present

## 2015-03-23 DIAGNOSIS — N2581 Secondary hyperparathyroidism of renal origin: Secondary | ICD-10-CM | POA: Diagnosis not present

## 2015-03-26 DIAGNOSIS — D631 Anemia in chronic kidney disease: Secondary | ICD-10-CM | POA: Diagnosis not present

## 2015-03-26 DIAGNOSIS — N186 End stage renal disease: Secondary | ICD-10-CM | POA: Diagnosis not present

## 2015-03-26 DIAGNOSIS — N2581 Secondary hyperparathyroidism of renal origin: Secondary | ICD-10-CM | POA: Diagnosis not present

## 2015-03-26 DIAGNOSIS — D509 Iron deficiency anemia, unspecified: Secondary | ICD-10-CM | POA: Diagnosis not present

## 2015-03-26 DIAGNOSIS — E1129 Type 2 diabetes mellitus with other diabetic kidney complication: Secondary | ICD-10-CM | POA: Diagnosis not present

## 2015-03-28 DIAGNOSIS — D509 Iron deficiency anemia, unspecified: Secondary | ICD-10-CM | POA: Diagnosis not present

## 2015-03-28 DIAGNOSIS — N186 End stage renal disease: Secondary | ICD-10-CM | POA: Diagnosis not present

## 2015-03-28 DIAGNOSIS — E1129 Type 2 diabetes mellitus with other diabetic kidney complication: Secondary | ICD-10-CM | POA: Diagnosis not present

## 2015-03-28 DIAGNOSIS — N2581 Secondary hyperparathyroidism of renal origin: Secondary | ICD-10-CM | POA: Diagnosis not present

## 2015-03-28 DIAGNOSIS — I120 Hypertensive chronic kidney disease with stage 5 chronic kidney disease or end stage renal disease: Secondary | ICD-10-CM | POA: Diagnosis not present

## 2015-03-28 DIAGNOSIS — D631 Anemia in chronic kidney disease: Secondary | ICD-10-CM | POA: Diagnosis not present

## 2015-03-28 DIAGNOSIS — Z992 Dependence on renal dialysis: Secondary | ICD-10-CM | POA: Diagnosis not present

## 2015-03-30 DIAGNOSIS — N186 End stage renal disease: Secondary | ICD-10-CM | POA: Diagnosis not present

## 2015-03-30 DIAGNOSIS — D631 Anemia in chronic kidney disease: Secondary | ICD-10-CM | POA: Diagnosis not present

## 2015-03-30 DIAGNOSIS — N2581 Secondary hyperparathyroidism of renal origin: Secondary | ICD-10-CM | POA: Diagnosis not present

## 2015-03-30 DIAGNOSIS — D509 Iron deficiency anemia, unspecified: Secondary | ICD-10-CM | POA: Diagnosis not present

## 2015-03-30 DIAGNOSIS — E1129 Type 2 diabetes mellitus with other diabetic kidney complication: Secondary | ICD-10-CM | POA: Diagnosis not present

## 2015-04-02 DIAGNOSIS — E1129 Type 2 diabetes mellitus with other diabetic kidney complication: Secondary | ICD-10-CM | POA: Diagnosis not present

## 2015-04-02 DIAGNOSIS — N2581 Secondary hyperparathyroidism of renal origin: Secondary | ICD-10-CM | POA: Diagnosis not present

## 2015-04-02 DIAGNOSIS — N186 End stage renal disease: Secondary | ICD-10-CM | POA: Diagnosis not present

## 2015-04-02 DIAGNOSIS — D631 Anemia in chronic kidney disease: Secondary | ICD-10-CM | POA: Diagnosis not present

## 2015-04-02 DIAGNOSIS — D509 Iron deficiency anemia, unspecified: Secondary | ICD-10-CM | POA: Diagnosis not present

## 2015-04-04 DIAGNOSIS — D631 Anemia in chronic kidney disease: Secondary | ICD-10-CM | POA: Diagnosis not present

## 2015-04-04 DIAGNOSIS — E1129 Type 2 diabetes mellitus with other diabetic kidney complication: Secondary | ICD-10-CM | POA: Diagnosis not present

## 2015-04-04 DIAGNOSIS — D509 Iron deficiency anemia, unspecified: Secondary | ICD-10-CM | POA: Diagnosis not present

## 2015-04-04 DIAGNOSIS — N186 End stage renal disease: Secondary | ICD-10-CM | POA: Diagnosis not present

## 2015-04-04 DIAGNOSIS — N2581 Secondary hyperparathyroidism of renal origin: Secondary | ICD-10-CM | POA: Diagnosis not present

## 2015-04-06 DIAGNOSIS — N186 End stage renal disease: Secondary | ICD-10-CM | POA: Diagnosis not present

## 2015-04-06 DIAGNOSIS — D631 Anemia in chronic kidney disease: Secondary | ICD-10-CM | POA: Diagnosis not present

## 2015-04-06 DIAGNOSIS — E1129 Type 2 diabetes mellitus with other diabetic kidney complication: Secondary | ICD-10-CM | POA: Diagnosis not present

## 2015-04-06 DIAGNOSIS — D509 Iron deficiency anemia, unspecified: Secondary | ICD-10-CM | POA: Diagnosis not present

## 2015-04-06 DIAGNOSIS — N2581 Secondary hyperparathyroidism of renal origin: Secondary | ICD-10-CM | POA: Diagnosis not present

## 2015-04-09 DIAGNOSIS — N2581 Secondary hyperparathyroidism of renal origin: Secondary | ICD-10-CM | POA: Diagnosis not present

## 2015-04-09 DIAGNOSIS — N186 End stage renal disease: Secondary | ICD-10-CM | POA: Diagnosis not present

## 2015-04-09 DIAGNOSIS — D631 Anemia in chronic kidney disease: Secondary | ICD-10-CM | POA: Diagnosis not present

## 2015-04-09 DIAGNOSIS — D509 Iron deficiency anemia, unspecified: Secondary | ICD-10-CM | POA: Diagnosis not present

## 2015-04-09 DIAGNOSIS — E1129 Type 2 diabetes mellitus with other diabetic kidney complication: Secondary | ICD-10-CM | POA: Diagnosis not present

## 2015-04-11 DIAGNOSIS — D509 Iron deficiency anemia, unspecified: Secondary | ICD-10-CM | POA: Diagnosis not present

## 2015-04-11 DIAGNOSIS — E1129 Type 2 diabetes mellitus with other diabetic kidney complication: Secondary | ICD-10-CM | POA: Diagnosis not present

## 2015-04-11 DIAGNOSIS — N186 End stage renal disease: Secondary | ICD-10-CM | POA: Diagnosis not present

## 2015-04-11 DIAGNOSIS — D631 Anemia in chronic kidney disease: Secondary | ICD-10-CM | POA: Diagnosis not present

## 2015-04-11 DIAGNOSIS — N2581 Secondary hyperparathyroidism of renal origin: Secondary | ICD-10-CM | POA: Diagnosis not present

## 2015-04-13 DIAGNOSIS — D509 Iron deficiency anemia, unspecified: Secondary | ICD-10-CM | POA: Diagnosis not present

## 2015-04-13 DIAGNOSIS — N186 End stage renal disease: Secondary | ICD-10-CM | POA: Diagnosis not present

## 2015-04-13 DIAGNOSIS — N2581 Secondary hyperparathyroidism of renal origin: Secondary | ICD-10-CM | POA: Diagnosis not present

## 2015-04-13 DIAGNOSIS — D631 Anemia in chronic kidney disease: Secondary | ICD-10-CM | POA: Diagnosis not present

## 2015-04-13 DIAGNOSIS — E1129 Type 2 diabetes mellitus with other diabetic kidney complication: Secondary | ICD-10-CM | POA: Diagnosis not present

## 2015-04-16 DIAGNOSIS — D631 Anemia in chronic kidney disease: Secondary | ICD-10-CM | POA: Diagnosis not present

## 2015-04-16 DIAGNOSIS — D509 Iron deficiency anemia, unspecified: Secondary | ICD-10-CM | POA: Diagnosis not present

## 2015-04-16 DIAGNOSIS — N186 End stage renal disease: Secondary | ICD-10-CM | POA: Diagnosis not present

## 2015-04-16 DIAGNOSIS — N2581 Secondary hyperparathyroidism of renal origin: Secondary | ICD-10-CM | POA: Diagnosis not present

## 2015-04-16 DIAGNOSIS — E1129 Type 2 diabetes mellitus with other diabetic kidney complication: Secondary | ICD-10-CM | POA: Diagnosis not present

## 2015-04-18 DIAGNOSIS — N186 End stage renal disease: Secondary | ICD-10-CM | POA: Diagnosis not present

## 2015-04-18 DIAGNOSIS — D631 Anemia in chronic kidney disease: Secondary | ICD-10-CM | POA: Diagnosis not present

## 2015-04-18 DIAGNOSIS — D509 Iron deficiency anemia, unspecified: Secondary | ICD-10-CM | POA: Diagnosis not present

## 2015-04-18 DIAGNOSIS — N2581 Secondary hyperparathyroidism of renal origin: Secondary | ICD-10-CM | POA: Diagnosis not present

## 2015-04-18 DIAGNOSIS — E1129 Type 2 diabetes mellitus with other diabetic kidney complication: Secondary | ICD-10-CM | POA: Diagnosis not present

## 2015-04-20 DIAGNOSIS — D509 Iron deficiency anemia, unspecified: Secondary | ICD-10-CM | POA: Diagnosis not present

## 2015-04-20 DIAGNOSIS — N186 End stage renal disease: Secondary | ICD-10-CM | POA: Diagnosis not present

## 2015-04-20 DIAGNOSIS — D631 Anemia in chronic kidney disease: Secondary | ICD-10-CM | POA: Diagnosis not present

## 2015-04-20 DIAGNOSIS — E1129 Type 2 diabetes mellitus with other diabetic kidney complication: Secondary | ICD-10-CM | POA: Diagnosis not present

## 2015-04-20 DIAGNOSIS — N2581 Secondary hyperparathyroidism of renal origin: Secondary | ICD-10-CM | POA: Diagnosis not present

## 2015-04-23 DIAGNOSIS — D631 Anemia in chronic kidney disease: Secondary | ICD-10-CM | POA: Diagnosis not present

## 2015-04-23 DIAGNOSIS — D509 Iron deficiency anemia, unspecified: Secondary | ICD-10-CM | POA: Diagnosis not present

## 2015-04-23 DIAGNOSIS — N186 End stage renal disease: Secondary | ICD-10-CM | POA: Diagnosis not present

## 2015-04-23 DIAGNOSIS — E1129 Type 2 diabetes mellitus with other diabetic kidney complication: Secondary | ICD-10-CM | POA: Diagnosis not present

## 2015-04-23 DIAGNOSIS — N2581 Secondary hyperparathyroidism of renal origin: Secondary | ICD-10-CM | POA: Diagnosis not present

## 2015-04-25 DIAGNOSIS — N186 End stage renal disease: Secondary | ICD-10-CM | POA: Diagnosis not present

## 2015-04-25 DIAGNOSIS — E1129 Type 2 diabetes mellitus with other diabetic kidney complication: Secondary | ICD-10-CM | POA: Diagnosis not present

## 2015-04-25 DIAGNOSIS — D631 Anemia in chronic kidney disease: Secondary | ICD-10-CM | POA: Diagnosis not present

## 2015-04-25 DIAGNOSIS — N2581 Secondary hyperparathyroidism of renal origin: Secondary | ICD-10-CM | POA: Diagnosis not present

## 2015-04-25 DIAGNOSIS — D509 Iron deficiency anemia, unspecified: Secondary | ICD-10-CM | POA: Diagnosis not present

## 2015-04-27 DIAGNOSIS — N186 End stage renal disease: Secondary | ICD-10-CM | POA: Diagnosis not present

## 2015-04-27 DIAGNOSIS — D631 Anemia in chronic kidney disease: Secondary | ICD-10-CM | POA: Diagnosis not present

## 2015-04-27 DIAGNOSIS — N2581 Secondary hyperparathyroidism of renal origin: Secondary | ICD-10-CM | POA: Diagnosis not present

## 2015-04-27 DIAGNOSIS — E1129 Type 2 diabetes mellitus with other diabetic kidney complication: Secondary | ICD-10-CM | POA: Diagnosis not present

## 2015-04-27 DIAGNOSIS — D509 Iron deficiency anemia, unspecified: Secondary | ICD-10-CM | POA: Diagnosis not present

## 2015-04-28 DIAGNOSIS — N186 End stage renal disease: Secondary | ICD-10-CM | POA: Diagnosis not present

## 2015-04-28 DIAGNOSIS — I120 Hypertensive chronic kidney disease with stage 5 chronic kidney disease or end stage renal disease: Secondary | ICD-10-CM | POA: Diagnosis not present

## 2015-04-28 DIAGNOSIS — Z992 Dependence on renal dialysis: Secondary | ICD-10-CM | POA: Diagnosis not present

## 2015-04-30 DIAGNOSIS — D631 Anemia in chronic kidney disease: Secondary | ICD-10-CM | POA: Diagnosis not present

## 2015-04-30 DIAGNOSIS — N186 End stage renal disease: Secondary | ICD-10-CM | POA: Diagnosis not present

## 2015-04-30 DIAGNOSIS — E1129 Type 2 diabetes mellitus with other diabetic kidney complication: Secondary | ICD-10-CM | POA: Diagnosis not present

## 2015-04-30 DIAGNOSIS — D509 Iron deficiency anemia, unspecified: Secondary | ICD-10-CM | POA: Diagnosis not present

## 2015-04-30 DIAGNOSIS — N2581 Secondary hyperparathyroidism of renal origin: Secondary | ICD-10-CM | POA: Diagnosis not present

## 2015-05-02 DIAGNOSIS — E1129 Type 2 diabetes mellitus with other diabetic kidney complication: Secondary | ICD-10-CM | POA: Diagnosis not present

## 2015-05-02 DIAGNOSIS — D631 Anemia in chronic kidney disease: Secondary | ICD-10-CM | POA: Diagnosis not present

## 2015-05-02 DIAGNOSIS — N186 End stage renal disease: Secondary | ICD-10-CM | POA: Diagnosis not present

## 2015-05-02 DIAGNOSIS — D509 Iron deficiency anemia, unspecified: Secondary | ICD-10-CM | POA: Diagnosis not present

## 2015-05-02 DIAGNOSIS — N2581 Secondary hyperparathyroidism of renal origin: Secondary | ICD-10-CM | POA: Diagnosis not present

## 2015-05-04 DIAGNOSIS — N2581 Secondary hyperparathyroidism of renal origin: Secondary | ICD-10-CM | POA: Diagnosis not present

## 2015-05-04 DIAGNOSIS — E1129 Type 2 diabetes mellitus with other diabetic kidney complication: Secondary | ICD-10-CM | POA: Diagnosis not present

## 2015-05-04 DIAGNOSIS — D509 Iron deficiency anemia, unspecified: Secondary | ICD-10-CM | POA: Diagnosis not present

## 2015-05-04 DIAGNOSIS — D631 Anemia in chronic kidney disease: Secondary | ICD-10-CM | POA: Diagnosis not present

## 2015-05-04 DIAGNOSIS — N186 End stage renal disease: Secondary | ICD-10-CM | POA: Diagnosis not present

## 2015-05-07 DIAGNOSIS — D509 Iron deficiency anemia, unspecified: Secondary | ICD-10-CM | POA: Diagnosis not present

## 2015-05-07 DIAGNOSIS — E1129 Type 2 diabetes mellitus with other diabetic kidney complication: Secondary | ICD-10-CM | POA: Diagnosis not present

## 2015-05-07 DIAGNOSIS — D631 Anemia in chronic kidney disease: Secondary | ICD-10-CM | POA: Diagnosis not present

## 2015-05-07 DIAGNOSIS — N2581 Secondary hyperparathyroidism of renal origin: Secondary | ICD-10-CM | POA: Diagnosis not present

## 2015-05-07 DIAGNOSIS — N186 End stage renal disease: Secondary | ICD-10-CM | POA: Diagnosis not present

## 2015-05-09 DIAGNOSIS — D509 Iron deficiency anemia, unspecified: Secondary | ICD-10-CM | POA: Diagnosis not present

## 2015-05-09 DIAGNOSIS — D631 Anemia in chronic kidney disease: Secondary | ICD-10-CM | POA: Diagnosis not present

## 2015-05-09 DIAGNOSIS — N186 End stage renal disease: Secondary | ICD-10-CM | POA: Diagnosis not present

## 2015-05-09 DIAGNOSIS — E1129 Type 2 diabetes mellitus with other diabetic kidney complication: Secondary | ICD-10-CM | POA: Diagnosis not present

## 2015-05-09 DIAGNOSIS — N2581 Secondary hyperparathyroidism of renal origin: Secondary | ICD-10-CM | POA: Diagnosis not present

## 2015-05-11 DIAGNOSIS — D631 Anemia in chronic kidney disease: Secondary | ICD-10-CM | POA: Diagnosis not present

## 2015-05-11 DIAGNOSIS — N2581 Secondary hyperparathyroidism of renal origin: Secondary | ICD-10-CM | POA: Diagnosis not present

## 2015-05-11 DIAGNOSIS — D509 Iron deficiency anemia, unspecified: Secondary | ICD-10-CM | POA: Diagnosis not present

## 2015-05-11 DIAGNOSIS — E1129 Type 2 diabetes mellitus with other diabetic kidney complication: Secondary | ICD-10-CM | POA: Diagnosis not present

## 2015-05-11 DIAGNOSIS — N186 End stage renal disease: Secondary | ICD-10-CM | POA: Diagnosis not present

## 2015-05-14 DIAGNOSIS — D509 Iron deficiency anemia, unspecified: Secondary | ICD-10-CM | POA: Diagnosis not present

## 2015-05-14 DIAGNOSIS — E1129 Type 2 diabetes mellitus with other diabetic kidney complication: Secondary | ICD-10-CM | POA: Diagnosis not present

## 2015-05-14 DIAGNOSIS — N2581 Secondary hyperparathyroidism of renal origin: Secondary | ICD-10-CM | POA: Diagnosis not present

## 2015-05-14 DIAGNOSIS — N186 End stage renal disease: Secondary | ICD-10-CM | POA: Diagnosis not present

## 2015-05-14 DIAGNOSIS — D631 Anemia in chronic kidney disease: Secondary | ICD-10-CM | POA: Diagnosis not present

## 2015-05-16 DIAGNOSIS — D631 Anemia in chronic kidney disease: Secondary | ICD-10-CM | POA: Diagnosis not present

## 2015-05-16 DIAGNOSIS — N2581 Secondary hyperparathyroidism of renal origin: Secondary | ICD-10-CM | POA: Diagnosis not present

## 2015-05-16 DIAGNOSIS — D509 Iron deficiency anemia, unspecified: Secondary | ICD-10-CM | POA: Diagnosis not present

## 2015-05-16 DIAGNOSIS — E1129 Type 2 diabetes mellitus with other diabetic kidney complication: Secondary | ICD-10-CM | POA: Diagnosis not present

## 2015-05-16 DIAGNOSIS — N186 End stage renal disease: Secondary | ICD-10-CM | POA: Diagnosis not present

## 2015-05-18 DIAGNOSIS — D509 Iron deficiency anemia, unspecified: Secondary | ICD-10-CM | POA: Diagnosis not present

## 2015-05-18 DIAGNOSIS — N186 End stage renal disease: Secondary | ICD-10-CM | POA: Diagnosis not present

## 2015-05-18 DIAGNOSIS — N2581 Secondary hyperparathyroidism of renal origin: Secondary | ICD-10-CM | POA: Diagnosis not present

## 2015-05-18 DIAGNOSIS — E1129 Type 2 diabetes mellitus with other diabetic kidney complication: Secondary | ICD-10-CM | POA: Diagnosis not present

## 2015-05-18 DIAGNOSIS — D631 Anemia in chronic kidney disease: Secondary | ICD-10-CM | POA: Diagnosis not present

## 2015-05-21 DIAGNOSIS — N186 End stage renal disease: Secondary | ICD-10-CM | POA: Diagnosis not present

## 2015-05-21 DIAGNOSIS — E1129 Type 2 diabetes mellitus with other diabetic kidney complication: Secondary | ICD-10-CM | POA: Diagnosis not present

## 2015-05-21 DIAGNOSIS — D509 Iron deficiency anemia, unspecified: Secondary | ICD-10-CM | POA: Diagnosis not present

## 2015-05-21 DIAGNOSIS — D631 Anemia in chronic kidney disease: Secondary | ICD-10-CM | POA: Diagnosis not present

## 2015-05-21 DIAGNOSIS — N2581 Secondary hyperparathyroidism of renal origin: Secondary | ICD-10-CM | POA: Diagnosis not present

## 2015-05-23 DIAGNOSIS — N2581 Secondary hyperparathyroidism of renal origin: Secondary | ICD-10-CM | POA: Diagnosis not present

## 2015-05-23 DIAGNOSIS — E1129 Type 2 diabetes mellitus with other diabetic kidney complication: Secondary | ICD-10-CM | POA: Diagnosis not present

## 2015-05-23 DIAGNOSIS — D509 Iron deficiency anemia, unspecified: Secondary | ICD-10-CM | POA: Diagnosis not present

## 2015-05-23 DIAGNOSIS — D631 Anemia in chronic kidney disease: Secondary | ICD-10-CM | POA: Diagnosis not present

## 2015-05-23 DIAGNOSIS — N186 End stage renal disease: Secondary | ICD-10-CM | POA: Diagnosis not present

## 2015-05-25 DIAGNOSIS — N2581 Secondary hyperparathyroidism of renal origin: Secondary | ICD-10-CM | POA: Diagnosis not present

## 2015-05-25 DIAGNOSIS — D631 Anemia in chronic kidney disease: Secondary | ICD-10-CM | POA: Diagnosis not present

## 2015-05-25 DIAGNOSIS — E1129 Type 2 diabetes mellitus with other diabetic kidney complication: Secondary | ICD-10-CM | POA: Diagnosis not present

## 2015-05-25 DIAGNOSIS — D509 Iron deficiency anemia, unspecified: Secondary | ICD-10-CM | POA: Diagnosis not present

## 2015-05-25 DIAGNOSIS — N186 End stage renal disease: Secondary | ICD-10-CM | POA: Diagnosis not present

## 2015-05-26 ENCOUNTER — Emergency Department (HOSPITAL_COMMUNITY)
Admission: EM | Admit: 2015-05-26 | Discharge: 2015-05-26 | Disposition: A | Discharge status: Home / Self Care | Payer: Medicare Other | Attending: Emergency Medicine | Admitting: Emergency Medicine

## 2015-05-26 ENCOUNTER — Encounter (HOSPITAL_COMMUNITY): Payer: Self-pay | Admitting: Cardiology

## 2015-05-26 DIAGNOSIS — Z87448 Personal history of other diseases of urinary system: Secondary | ICD-10-CM | POA: Insufficient documentation

## 2015-05-26 DIAGNOSIS — R1110 Vomiting, unspecified: Secondary | ICD-10-CM | POA: Diagnosis present

## 2015-05-26 DIAGNOSIS — K3184 Gastroparesis: Secondary | ICD-10-CM | POA: Diagnosis not present

## 2015-05-26 DIAGNOSIS — E119 Type 2 diabetes mellitus without complications: Secondary | ICD-10-CM | POA: Diagnosis not present

## 2015-05-26 DIAGNOSIS — I159 Secondary hypertension, unspecified: Secondary | ICD-10-CM | POA: Diagnosis not present

## 2015-05-26 DIAGNOSIS — I10 Essential (primary) hypertension: Secondary | ICD-10-CM | POA: Diagnosis not present

## 2015-05-26 HISTORY — DX: Type 2 diabetes mellitus without complications: E11.9

## 2015-05-26 HISTORY — DX: Disorder of kidney and ureter, unspecified: N28.9

## 2015-05-26 HISTORY — DX: Essential (primary) hypertension: I10

## 2015-05-26 LAB — CBC WITH DIFFERENTIAL/PLATELET
Basophils Absolute: 0 10*3/uL (ref 0.0–0.1)
Basophils Relative: 0 % (ref 0–1)
Eosinophils Absolute: 0.1 10*3/uL (ref 0.0–0.7)
Eosinophils Relative: 1 % (ref 0–5)
HCT: 37.4 % — ABNORMAL LOW (ref 39.0–52.0)
Hemoglobin: 12.6 g/dL — ABNORMAL LOW (ref 13.0–17.0)
Lymphocytes Relative: 10 % — ABNORMAL LOW (ref 12–46)
Lymphs Abs: 1 10*3/uL (ref 0.7–4.0)
MCH: 29.9 pg (ref 26.0–34.0)
MCHC: 33.7 g/dL (ref 30.0–36.0)
MCV: 88.8 fL (ref 78.0–100.0)
Monocytes Absolute: 0.5 10*3/uL (ref 0.1–1.0)
Monocytes Relative: 5 % (ref 3–12)
Neutro Abs: 8 10*3/uL — ABNORMAL HIGH (ref 1.7–7.7)
Neutrophils Relative %: 84 % — ABNORMAL HIGH (ref 43–77)
Platelets: 159 10*3/uL (ref 150–400)
RBC: 4.21 MIL/uL — ABNORMAL LOW (ref 4.22–5.81)
RDW: 13.9 % (ref 11.5–15.5)
WBC: 9.6 10*3/uL (ref 4.0–10.5)

## 2015-05-26 LAB — COMPREHENSIVE METABOLIC PANEL
ALT: 12 U/L — ABNORMAL LOW (ref 17–63)
AST: 29 U/L (ref 15–41)
Albumin: 4.9 g/dL (ref 3.5–5.0)
Alkaline Phosphatase: 90 U/L (ref 38–126)
Anion gap: 18 — ABNORMAL HIGH (ref 5–15)
BUN: 25 mg/dL — ABNORMAL HIGH (ref 6–20)
CO2: 24 mmol/L (ref 22–32)
Calcium: 9.9 mg/dL (ref 8.9–10.3)
Chloride: 99 mmol/L — ABNORMAL LOW (ref 101–111)
Creatinine, Ser: 7.26 mg/dL — ABNORMAL HIGH (ref 0.61–1.24)
GFR calc Af Amer: 10 mL/min — ABNORMAL LOW (ref >60)
GFR calc non Af Amer: 9 mL/min — ABNORMAL LOW (ref >60)
Glucose, Bld: 235 mg/dL — ABNORMAL HIGH (ref 65–99)
Potassium: 3.9 mmol/L (ref 3.5–5.1)
Sodium: 141 mmol/L (ref 135–145)
Total Bilirubin: 1.4 mg/dL — ABNORMAL HIGH (ref 0.3–1.2)
Total Protein: 8.3 g/dL — ABNORMAL HIGH (ref 6.5–8.1)

## 2015-05-26 LAB — CBG MONITORING, ED
Glucose-Capillary: 258 mg/dL — ABNORMAL HIGH (ref 65–99)
Glucose-Capillary: 183 mg/dL — ABNORMAL HIGH (ref 65–99)
Glucose-Capillary: 180 mg/dL — ABNORMAL HIGH (ref 65–99)

## 2015-05-26 LAB — URINALYSIS, ROUTINE W REFLEX MICROSCOPIC
Bilirubin Urine: NEGATIVE
Glucose, UA: 500 mg/dL — AB
Ketones, ur: 15 mg/dL — AB
Leukocytes, UA: NEGATIVE
Nitrite: NEGATIVE
Protein, ur: >300 mg/dL — AB
Specific Gravity, Urine: 1.016 (ref 1.005–1.030)
Urobilinogen, UA: 0.2 mg/dL (ref 0.0–1.0)
pH: 8 (ref 5.0–8.0)

## 2015-05-26 LAB — URINE MICROSCOPIC-ADD ON

## 2015-05-26 LAB — I-STAT CG4 LACTIC ACID, ED
Lactic Acid, Venous: 2.17 mmol/L (ref 0.5–2.0)
Lactic Acid, Venous: 1.59 mmol/L (ref 0.5–2.0)

## 2015-05-26 LAB — LIPASE, BLOOD: Lipase: 45 U/L (ref 22–51)

## 2015-05-26 MED ORDER — SODIUM CHLORIDE 0.9 % IV BOLUS (SEPSIS)
500.0000 mL | Freq: Once | INTRAVENOUS | Status: AC
  Administered 2015-05-26: 500 mL via INTRAVENOUS

## 2015-05-26 MED ORDER — HYDROMORPHONE HCL 1 MG/ML IJ SOLN
1.0000 mg | Freq: Once | INTRAMUSCULAR | Status: AC
  Administered 2015-05-26: 1 mg via INTRAVENOUS
  Filled 2015-05-26: qty 1

## 2015-05-26 MED ORDER — PROMETHAZINE HCL 25 MG/ML IJ SOLN
25.0000 mg | Freq: Once | INTRAMUSCULAR | Status: AC
  Administered 2015-05-26: 25 mg via INTRAVENOUS
  Filled 2015-05-26: qty 1

## 2015-05-26 MED ORDER — CLONIDINE HCL 0.2 MG PO TABS: 0.2000 mg | ORAL_TABLET | Freq: Once | ORAL | Status: DC

## 2015-05-26 MED ORDER — METOCLOPRAMIDE HCL 5 MG/ML IJ SOLN
10.0000 mg | Freq: Once | INTRAMUSCULAR | Status: AC
  Administered 2015-05-26: 10 mg via INTRAVENOUS
  Filled 2015-05-26: qty 2

## 2015-05-26 MED ORDER — PROMETHAZINE HCL 25 MG PO TABS: 25.0000 mg | ORAL_TABLET | Freq: Four times a day (QID) | ORAL | Status: DC | PRN

## 2015-05-26 MED ORDER — HYDRALAZINE HCL 20 MG/ML IJ SOLN
2.0000 mg | Freq: Once | INTRAMUSCULAR | Status: AC
  Administered 2015-05-26: 5 mg via INTRAVENOUS
  Filled 2015-05-26: qty 1

## 2015-05-26 MED ORDER — LORAZEPAM 2 MG/ML IJ SOLN
0.5000 mg | Freq: Once | INTRAMUSCULAR | Status: AC
  Administered 2015-05-26: 0.5 mg via INTRAVENOUS
  Filled 2015-05-26: qty 1

## 2015-05-26 NOTE — ED Notes (Signed)
Pt reports he is a Tuesday, Thursday, Saturday dialysis patient and had his full treatment yesterday.

## 2015-05-26 NOTE — ED Notes (Signed)
Pt reports abd pain and vomiting for the past 3 days. States he is a dialysis pt and went for his treatment yesterday. Pt states he has not had any sick contacts at home.

## 2015-05-26 NOTE — ED Notes (Signed)
Pt given PO fluids per EDP

## 2015-05-26 NOTE — ED Notes (Signed)
Saa with lab at bedside to draw labs. Pt states "not right now." Delos Haring PA made aware.

## 2015-05-26 NOTE — ED Notes (Signed)
Provider at the bedside.  

## 2015-05-26 NOTE — ED Notes (Signed)
Entered to find pt with blood on his hand and leg where IV had pulled out of site. Pt given clean washcloth to remove blood.

## 2015-05-26 NOTE — ED Notes (Signed)
Asked pt if he is willing to have his labs draw. States he will allow lab to stick him. Lab called.

## 2015-05-26 NOTE — Discharge Instructions (Signed)
Gastroparesis  Gastroparesis is also called slowed stomach emptying (delayed gastric emptying). It is a condition in which the stomach takes too long to empty its contents. It often happens in people with diabetes.  CAUSES  Gastroparesis happens when nerves to the stomach are damaged or stop working. When the nerves are damaged, the muscles of the stomach and intestines do not work normally. The movement of food is slowed or stopped. High blood glucose (sugar) causes changes in nerves and can damage the blood vessels that carry oxygen and nutrients to the nerves. RISK FACTORS  Diabetes.  Post-viral syndromes.  Eating disorders (anorexia, bulimia).  Surgery on the stomach or vagus nerve.  Gastroesophageal reflux disease (rarely).  Smooth muscle disorders (amyloidosis, scleroderma).  Metabolic disorders, including hypothyroidism.  Parkinson disease. SYMPTOMS   Heartburn.  Feeling sick to your stomach (nausea).  Vomiting of undigested food.  An early feeling of fullness when eating.  Weight loss.  Abdominal bloating.  Erratic blood glucose levels.  Lack of appetite.  Gastroesophageal reflux.  Spasms of the stomach wall. Complications can include:  Bacterial overgrowth in stomach. Food stays in the stomach and can ferment and cause bacteria to grow.  Weight loss due to difficulty digesting and absorbing nutrients.  Vomiting.  Obstruction in the stomach. Undigested food can harden and cause nausea and vomiting.  Blood glucose fluctuations caused by inconsistent food absorption. DIAGNOSIS  The diagnosis of gastroparesis is confirmed through one or more of the following tests:  Barium X-rays and scans. These tests look at how long it takes for food to move through the stomach.  Gastric manometry. This test measures electrical and muscular activity in the stomach. A thin tube is passed down the throat into the stomach. The tube contains a wire that takes measurements  of the stomach's electrical and muscular activity as it digests liquids and solid food.  Endoscopy. This procedure is done with a long, thin tube called an endoscope. It is passed through the mouth and gently down the esophagus into the stomach. This tube helps the caregiver look at the lining of the stomach to check for any abnormalities.  Ultrasonography. This can rule out gallbladder disease or pancreatitis. This test will outline and define the shape of the gallbladder and pancreas. TREATMENT   Treatments may include:  Exercise.  Medicines to control nausea and vomiting.  Medicines to stimulate stomach muscles.  Changes in what and when you eat.  Having smaller meals more often.  Eating low-fiber forms of high-fiber foods, such as eating cooked vegetables instead of raw vegetables.  Eating low-fat foods.  Consuming liquids, which are easier to digest.  In severe cases, feeding tubes and intravenous (IV) feeding may be needed. It is important to note that in most cases, treatment does not cure gastroparesis. It is usually a lasting (chronic) condition. Treatment helps you manage the underlying condition so that you can be as healthy and comfortable as possible. Other treatments  A gastric neurostimulator has been developed to assist people with gastroparesis. The battery-operated device is surgically implanted. It emits mild electrical pulses to help improve stomach emptying and to control nausea and vomiting.  The use of botulinum toxin has been shown to improve stomach emptying by decreasing the prolonged contractions of the muscle between the stomach and the small intestine (pyloric sphincter). The benefits are temporary. SEEK MEDICAL CARE IF:  Hypertension Hypertension, commonly called high blood pressure, is when the force of blood pumping through your arteries is too strong. Your  arteries are the blood vessels that carry blood from your heart throughout your body. A blood  pressure reading consists of a higher number over a lower number, such as 110/72. The higher number (systolic) is the pressure inside your arteries when your heart pumps. The lower number (diastolic) is the pressure inside your arteries when your heart relaxes. Ideally you want your blood pressure below 120/80. Hypertension forces your heart to work harder to pump blood. Your arteries may become narrow or stiff. Having hypertension puts you at risk for heart disease, stroke, and other problems.  RISK FACTORS Some risk factors for high blood pressure are controllable. Others are not.  Risk factors you cannot control include:   Race. You may be at higher risk if you are African American.  Age. Risk increases with age.  Gender. Men are at higher risk than women before age 41 years. After age 10, women are at higher risk than men. Risk factors you can control include:  Not getting enough exercise or physical activity.  Being overweight.  Getting too much fat, sugar, calories, or salt in your diet.  Drinking too much alcohol. SIGNS AND SYMPTOMS Hypertension does not usually cause signs or symptoms. Extremely high blood pressure (hypertensive crisis) may cause headache, anxiety, shortness of breath, and nosebleed. DIAGNOSIS  To check if you have hypertension, your health care provider will measure your blood pressure while you are seated, with your arm held at the level of your heart. It should be measured at least twice using the same arm. Certain conditions can cause a difference in blood pressure between your right and left arms. A blood pressure reading that is higher than normal on one occasion does not mean that you need treatment. If one blood pressure reading is high, ask your health care provider about having it checked again. TREATMENT  Treating high blood pressure includes making lifestyle changes and possibly taking medicine. Living a healthy lifestyle can help lower high blood  pressure. You may need to change some of your habits. Lifestyle changes may include:  Following the DASH diet. This diet is high in fruits, vegetables, and whole grains. It is low in salt, red meat, and added sugars.  Getting at least 2 hours of brisk physical activity every week.  Losing weight if necessary.  Not smoking.  Limiting alcoholic beverages.  Learning ways to reduce stress. If lifestyle changes are not enough to get your blood pressure under control, your health care provider may prescribe medicine. You may need to take more than one. Work closely with your health care provider to understand the risks and benefits. HOME CARE INSTRUCTIONS  Have your blood pressure rechecked as directed by your health care provider.   Take medicines only as directed by your health care provider. Follow the directions carefully. Blood pressure medicines must be taken as prescribed. The medicine does not work as well when you skip doses. Skipping doses also puts you at risk for problems.   Do not smoke.   Monitor your blood pressure at home as directed by your health care provider. SEEK MEDICAL CARE IF:   You think you are having a reaction to medicines taken.  You have recurrent headaches or feel dizzy.  You have swelling in your ankles.  You have trouble with your vision. SEEK IMMEDIATE MEDICAL CARE IF:  You develop a severe headache or confusion.  You have unusual weakness, numbness, or feel faint.  You have severe chest or abdominal pain.  You vomit  repeatedly.  You have trouble breathing. MAKE SURE YOU:   Understand these instructions.  Will watch your condition.  Will get help right away if you are not doing well or get worse. Document Released: 09/14/2005 Document Revised: 01/29/2014 Document Reviewed: 07/07/2013 [facility] Patient Information 2015 [place], [place]. This information is not intended to replace advice given to you by your health care provider. Make  sure you discuss any questions you have with your health care provider.   You have diabetes and you are having problems keeping your blood glucose in goal range.  You are having nausea, vomiting, bloating, or early feelings of fullness with eating.  Your symptoms do not change with a change in diet. Document Released: 09/14/2005 Document Revised: 01/09/2013 Document Reviewed: 02/21/2009 [facility] Patient Information 2015 [place], [place]. This information is not intended to replace advice given to you by your health care provider. Make sure you discuss any questions you have with your health care provider.

## 2015-05-26 NOTE — ED Provider Notes (Signed)
CSN: [id number]     Arrival date & time 05/26/15  0824 History   First MD Initiated Contact with Patient 05/26/15 0827     Chief Complaint  Patient presents with  . Emesis     (Consider location/radiation/quality/duration/timing/severity/associated sxs/prior Treatment) HPI   [name] 34 y.o.male  PCP: No primary care provider on file. SIGNIFICANT PMH: Diabetes, Hypertension, Dialysis patient (Tuesday, Thursday, Saturday at [facility] dialysis in [place], [place]), DKA and gastroparesis CHIEF COMPLAINT: Vomiting  Blood pressure 179/98, pulse 95, temperature 98.3 F (36.8 C), temperature source Oral, resp. rate 22, SpO2 99 %.  [name] comes to the ER appearing to be in severe discomfort, complaining of diffuse abdominal pain that does not localize. He has also had vomiting. He is diaphoretic and shivering, dry heaving during the exam. He reports that he is typically seen at [facility] or a hospital in [place] for his abdominal pains which used to happen frequently but has been more controlled as of late. He requests Dilaudid because that is what they always give him when he goes to [facility] ER for his abdominal pains and that it is the only thing that helps. Pt did go to dialysis yesterday. He was feeling nauseous at that time as well. Reports coming to [name] for treatment today instead of [place] because he is visiting family here in [place]. Pt declines to answer any further questions at this time.  The patient denies fever, headache, weakness (general or focal), confusion, change of vision,  neck pain, dysphagia, aphagia, chest pain, shortness of breath,  back pain,diarrhea, lower extremity swelling, rash.   Past Medical History  Diagnosis Date  . Diabetes mellitus without complication   . Hypertension   . Renal disorder     Dialysis   History reviewed. No pertinent past surgical history. History reviewed. No pertinent family history. Social History  Substance Use  Topics  . Smoking status: Never Smoker   . Smokeless tobacco: None  . Alcohol Use: No    Review of Systems  10 Systems reviewed and are negative for acute change except as noted in the HPI.     Allergies  Review of patient's allergies indicates no known allergies.  Home Medications   Prior to Admission medications   Medication Sig Start Date End Date Taking? Authorizing Provider  promethazine (PHENERGAN) 25 MG tablet Take 1 tablet (25 mg total) by mouth every 6 (six) hours as needed for nausea or vomiting. 05/26/15   Marili Vader Carlota Raspberry, PA-C   BP 128/58 mmHg  Pulse 92  Temp(Src) 98.6 F (37 C) (Oral)  Resp 11  SpO2 90% Physical Exam  Constitutional: He appears well-developed and well-nourished. He appears ill. He appears distressed.  HENT:  Head: Normocephalic and atraumatic.  Eyes: Pupils are equal, round, and reactive to light.  Neck: Normal range of motion. Neck supple.  Cardiovascular: Normal rate and regular rhythm.   Pulmonary/Chest: Effort normal and breath sounds normal.  Abdominal: Soft. Bowel sounds are normal. He exhibits no distension. There is tenderness (diffuse). There is no rigidity, no rebound, no guarding, no CVA tenderness and negative Murphy's sign.  Neurological: He is alert.  Skin: Skin is warm. He is diaphoretic.  Nursing note and vitals reviewed.   ED Course  Procedures (including critical care time) Labs Review Labs Reviewed  COMPREHENSIVE METABOLIC PANEL - Abnormal; Notable for the following:    Chloride 99 (*)    Glucose, Bld 235 (*)    BUN 25 (*)  Creatinine, Ser 7.26 (*)    Total Protein 8.3 (*)    ALT 12 (*)    Total Bilirubin 1.4 (*)    GFR calc non Af Amer 9 (*)    GFR calc Af Amer 10 (*)    Anion gap 18 (*)    All other components within normal limits  CBC WITH DIFFERENTIAL/PLATELET - Abnormal; Notable for the following:    RBC 4.21 (*)    Hemoglobin 12.6 (*)    HCT 37.4 (*)    Neutrophils Relative % 84 (*)    Neutro Abs 8.0  (*)    Lymphocytes Relative 10 (*)    All other components within normal limits  URINALYSIS, ROUTINE W REFLEX MICROSCOPIC (NOT AT [facility]) - Abnormal; Notable for the following:    Glucose, UA 500 (*)    Hgb urine dipstick MODERATE (*)    Ketones, ur 15 (*)    Protein, ur >300 (*)    All other components within normal limits  CBG MONITORING, ED - Abnormal; Notable for the following:    Glucose-Capillary 258 (*)    All other components within normal limits  I-STAT CG4 LACTIC ACID, ED - Abnormal; Notable for the following:    Lactic Acid, Venous 2.17 (*)    All other components within normal limits  CBG MONITORING, ED - Abnormal; Notable for the following:    Glucose-Capillary 183 (*)    All other components within normal limits  CBG MONITORING, ED - Abnormal; Notable for the following:    Glucose-Capillary 180 (*)    All other components within normal limits  LIPASE, BLOOD  URINE MICROSCOPIC-ADD ON  I-STAT CG4 LACTIC ACID, ED    Imaging Review No results found. I have personally reviewed and evaluated these images and lab results as part of my medical decision-making.   EKG Interpretation None      MDM   Final diagnoses:  Secondary hypertension, unspecified  Gastroparesis    @ 9:10 am - Patient refuses to have all labs drawn. @ 9:22 am - Patient says he doesn't feel well and wants [place] to wait 10 minutes first, understands we can not diagnose and treat his illness without blood work to determine etiology. Pt requests more pain medication. He will not receive any more medications for pain until he is cooperative. - Dr. Vanita Panda made aware of complications.  @ 10: 19 labs drawn  Patients labs are not severely abnormal, he has had three rounds of IV Dilaudid, 25 mg IV phenergan, 10 mg IV Reglan, and 0.5 mg IV Ativan to control nausea, vomiting and abdominal pain. After the third dose of Dilaudid and dose of Ativan his symptoms significantly improved. Dr. Vanita Panda saw him and  feels that he is ready for discharge, BP is back to normal at 128/58. Lactic acid has cleared. Will rx Phenergan. Pt to resume home meds and f/u with his PCP. Also, told he needs to attend dialysis on Tuesday.  Medications  sodium chloride 0.9 % bolus 500 mL (0 mLs Intravenous Stopped 05/26/15 1007)  promethazine (PHENERGAN) injection 25 mg (25 mg Intravenous Given 05/26/15 0952)  HYDROmorphone (DILAUDID) injection 1 mg (1 mg Intravenous Given 05/26/15 0901)  sodium chloride 0.9 % bolus 500 mL (0 mLs Intravenous Stopped 05/26/15 1243)  hydrALAZINE (APRESOLINE) injection 2 mg (5 mg Intravenous Given 05/26/15 1118)  HYDROmorphone (DILAUDID) injection 1 mg (1 mg Intravenous Given 05/26/15 1124)  metoCLOPramide (REGLAN) injection 10 mg (10 mg Intravenous Given 05/26/15 1124)  HYDROmorphone (  DILAUDID) injection 1 mg (1 mg Intravenous Given 05/26/15 1226)  LORazepam (ATIVAN) injection 0.5 mg (0.5 mg Intravenous Given 05/26/15 1226)    34 y.o.Osualdo Tavis Abascal's evaluation in the Emergency Department is complete. It has been determined that no acute conditions requiring further emergency intervention are present at this time. The patient/guardian have been advised of the diagnosis and plan. We have discussed signs and symptoms that warrant return to the ED, such as changes or worsening in symptoms.  Vital signs are stable at discharge. Filed Vitals:   05/26/15 1245  BP: 128/58  Pulse: 92  Temp:   Resp:     Patient/guardian has voiced understanding and agreed to follow-up with the PCP or specialist.   Delos Haring, PA-C 05/26/15 [place], MD 05/26/15 1610

## 2015-05-26 NOTE — ED Notes (Signed)
Given a urinal  

## 2015-05-26 NOTE — ED Notes (Signed)
Pt refusing to be discharged at this time. Reports, "I don't feel like I turned the corner yet." Pt informed that he was getting a prescription for phenergan. Reports, "I just want to wait longer to be sure."

## 2015-05-26 NOTE — ED Notes (Signed)
Pt up to the bathroom

## 2015-05-27 ENCOUNTER — Inpatient Hospital Stay
Admission: EM | Admit: 2015-05-27 | Discharge: 2015-05-29 | DRG: 637 | Disposition: A | Discharge status: Home / Self Care | Payer: Medicare Other | Attending: Internal Medicine | Admitting: Internal Medicine

## 2015-05-27 ENCOUNTER — Encounter: Payer: Self-pay | Admitting: Emergency Medicine

## 2015-05-27 ENCOUNTER — Inpatient Hospital Stay: Payer: Medicare Other

## 2015-05-27 DIAGNOSIS — E1042 Type 1 diabetes mellitus with diabetic polyneuropathy: Secondary | ICD-10-CM | POA: Diagnosis present

## 2015-05-27 DIAGNOSIS — E1010 Type 1 diabetes mellitus with ketoacidosis without coma: Secondary | ICD-10-CM | POA: Diagnosis not present

## 2015-05-27 DIAGNOSIS — R112 Nausea with vomiting, unspecified: Secondary | ICD-10-CM

## 2015-05-27 DIAGNOSIS — K2970 Gastritis, unspecified, without bleeding: Secondary | ICD-10-CM | POA: Diagnosis present

## 2015-05-27 DIAGNOSIS — R140 Abdominal distension (gaseous): Secondary | ICD-10-CM | POA: Diagnosis not present

## 2015-05-27 DIAGNOSIS — D631 Anemia in chronic kidney disease: Secondary | ICD-10-CM | POA: Diagnosis not present

## 2015-05-27 DIAGNOSIS — Z833 Family history of diabetes mellitus: Secondary | ICD-10-CM | POA: Diagnosis not present

## 2015-05-27 DIAGNOSIS — E1110 Type 2 diabetes mellitus with ketoacidosis without coma: Secondary | ICD-10-CM | POA: Diagnosis present

## 2015-05-27 DIAGNOSIS — Z79899 Other long term (current) drug therapy: Secondary | ICD-10-CM

## 2015-05-27 DIAGNOSIS — N2581 Secondary hyperparathyroidism of renal origin: Secondary | ICD-10-CM | POA: Diagnosis not present

## 2015-05-27 DIAGNOSIS — E872 Acidosis: Secondary | ICD-10-CM | POA: Diagnosis not present

## 2015-05-27 DIAGNOSIS — I10 Essential (primary) hypertension: Secondary | ICD-10-CM | POA: Diagnosis not present

## 2015-05-27 DIAGNOSIS — E1310 Other specified diabetes mellitus with ketoacidosis without coma: Secondary | ICD-10-CM | POA: Diagnosis not present

## 2015-05-27 DIAGNOSIS — Z992 Dependence on renal dialysis: Secondary | ICD-10-CM | POA: Diagnosis not present

## 2015-05-27 DIAGNOSIS — R1084 Generalized abdominal pain: Secondary | ICD-10-CM

## 2015-05-27 DIAGNOSIS — I120 Hypertensive chronic kidney disease with stage 5 chronic kidney disease or end stage renal disease: Secondary | ICD-10-CM | POA: Diagnosis not present

## 2015-05-27 DIAGNOSIS — R0602 Shortness of breath: Secondary | ICD-10-CM | POA: Diagnosis not present

## 2015-05-27 DIAGNOSIS — E119 Type 2 diabetes mellitus without complications: Secondary | ICD-10-CM | POA: Diagnosis not present

## 2015-05-27 DIAGNOSIS — N186 End stage renal disease: Secondary | ICD-10-CM | POA: Diagnosis not present

## 2015-05-27 DIAGNOSIS — R109 Unspecified abdominal pain: Secondary | ICD-10-CM | POA: Diagnosis not present

## 2015-05-27 DIAGNOSIS — R51 Headache: Secondary | ICD-10-CM | POA: Diagnosis not present

## 2015-05-27 DIAGNOSIS — E1169 Type 2 diabetes mellitus with other specified complication: Secondary | ICD-10-CM | POA: Diagnosis not present

## 2015-05-27 LAB — BASIC METABOLIC PANEL
Anion gap: 29 — ABNORMAL HIGH (ref 5–15)
Anion gap: 22 — ABNORMAL HIGH (ref 5–15)
BUN: 45 mg/dL — ABNORMAL HIGH (ref 6–20)
BUN: 52 mg/dL — ABNORMAL HIGH (ref 6–20)
CO2: 15 mmol/L — ABNORMAL LOW (ref 22–32)
CO2: 19 mmol/L — ABNORMAL LOW (ref 22–32)
Calcium: 9.1 mg/dL (ref 8.9–10.3)
Calcium: 9.5 mg/dL (ref 8.9–10.3)
Chloride: 95 mmol/L — ABNORMAL LOW (ref 101–111)
Chloride: 101 mmol/L (ref 101–111)
Creatinine, Ser: 9.88 mg/dL — ABNORMAL HIGH (ref 0.61–1.24)
Creatinine, Ser: 10.95 mg/dL — ABNORMAL HIGH (ref 0.61–1.24)
GFR calc Af Amer: 7 mL/min — ABNORMAL LOW (ref >60)
GFR calc Af Amer: 6 mL/min — ABNORMAL LOW (ref >60)
GFR calc non Af Amer: 6 mL/min — ABNORMAL LOW (ref >60)
GFR calc non Af Amer: 5 mL/min — ABNORMAL LOW (ref >60)
Glucose, Bld: 486 mg/dL — ABNORMAL HIGH (ref 65–99)
Glucose, Bld: 126 mg/dL — ABNORMAL HIGH (ref 65–99)
Potassium: 4.4 mmol/L (ref 3.5–5.1)
Potassium: 4.3 mmol/L (ref 3.5–5.1)
Sodium: 139 mmol/L (ref 135–145)
Sodium: 142 mmol/L (ref 135–145)

## 2015-05-27 LAB — GLUCOSE, CAPILLARY
Glucose-Capillary: 103 mg/dL — ABNORMAL HIGH (ref 65–99)
Glucose-Capillary: 113 mg/dL — ABNORMAL HIGH (ref 65–99)
Glucose-Capillary: 121 mg/dL — ABNORMAL HIGH (ref 65–99)
Glucose-Capillary: 138 mg/dL — ABNORMAL HIGH (ref 65–99)
Glucose-Capillary: 155 mg/dL — ABNORMAL HIGH (ref 65–99)
Glucose-Capillary: 186 mg/dL — ABNORMAL HIGH (ref 65–99)
Glucose-Capillary: 205 mg/dL — ABNORMAL HIGH (ref 65–99)
Glucose-Capillary: 212 mg/dL — ABNORMAL HIGH (ref 65–99)
Glucose-Capillary: 220 mg/dL — ABNORMAL HIGH (ref 65–99)
Glucose-Capillary: 265 mg/dL — ABNORMAL HIGH (ref 65–99)
Glucose-Capillary: 318 mg/dL — ABNORMAL HIGH (ref 65–99)
Glucose-Capillary: 413 mg/dL — ABNORMAL HIGH (ref 65–99)
Glucose-Capillary: 431 mg/dL — ABNORMAL HIGH (ref 65–99)
Glucose-Capillary: 437 mg/dL — ABNORMAL HIGH (ref 65–99)
Glucose-Capillary: 467 mg/dL — ABNORMAL HIGH (ref 65–99)
Glucose-Capillary: 76 mg/dL (ref 65–99)
Glucose-Capillary: 83 mg/dL (ref 65–99)

## 2015-05-27 LAB — COMPREHENSIVE METABOLIC PANEL
ALT: 11 U/L — ABNORMAL LOW (ref 17–63)
AST: 21 U/L (ref 15–41)
Albumin: 4.5 g/dL (ref 3.5–5.0)
Alkaline Phosphatase: 74 U/L (ref 38–126)
Anion gap: 28 — ABNORMAL HIGH (ref 5–15)
BUN: 44 mg/dL — ABNORMAL HIGH (ref 6–20)
CO2: 17 mmol/L — ABNORMAL LOW (ref 22–32)
Calcium: 9.5 mg/dL (ref 8.9–10.3)
Chloride: 94 mmol/L — ABNORMAL LOW (ref 101–111)
Creatinine, Ser: 9.48 mg/dL — ABNORMAL HIGH (ref 0.61–1.24)
GFR calc Af Amer: 7 mL/min — ABNORMAL LOW (ref >60)
GFR calc non Af Amer: 6 mL/min — ABNORMAL LOW (ref >60)
Glucose, Bld: 375 mg/dL — ABNORMAL HIGH (ref 65–99)
Potassium: 4.1 mmol/L (ref 3.5–5.1)
Sodium: 139 mmol/L (ref 135–145)
Total Bilirubin: 2 mg/dL — ABNORMAL HIGH (ref 0.3–1.2)
Total Protein: 7.8 g/dL (ref 6.5–8.1)

## 2015-05-27 LAB — BLOOD GAS, VENOUS
Acid-base deficit: 8.9 mmol/L — ABNORMAL HIGH (ref 0.0–2.0)
Bicarbonate: 16.8 mEq/L — ABNORMAL LOW (ref 21.0–28.0)
FIO2: 0.21
Patient temperature: 37
pCO2, Ven: 35 mmHg — ABNORMAL LOW (ref 44.0–60.0)
pH, Ven: 7.29 — ABNORMAL LOW (ref 7.320–7.430)

## 2015-05-27 LAB — CBC
HCT: 34.7 % — ABNORMAL LOW (ref 40.0–52.0)
Hemoglobin: 11.4 g/dL — ABNORMAL LOW (ref 13.0–18.0)
MCH: 30.1 pg (ref 26.0–34.0)
MCHC: 32.9 g/dL (ref 32.0–36.0)
MCV: 91.5 fL (ref 80.0–100.0)
Platelets: 132 10*3/uL — ABNORMAL LOW (ref 150–440)
RBC: 3.79 MIL/uL — ABNORMAL LOW (ref 4.40–5.90)
RDW: 15.3 % — ABNORMAL HIGH (ref 11.5–14.5)
WBC: 10.8 10*3/uL — ABNORMAL HIGH (ref 3.8–10.6)

## 2015-05-27 LAB — LIPASE, BLOOD: Lipase: 34 U/L (ref 22–51)

## 2015-05-27 LAB — TROPONIN I: Troponin I: <0.03 ng/mL (ref <0.031)

## 2015-05-27 LAB — LACTIC ACID, PLASMA
Lactic Acid, Venous: 1.5 mmol/L (ref 0.5–2.0)
Lactic Acid, Venous: 1.7 mmol/L (ref 0.5–2.0)

## 2015-05-27 LAB — MRSA PCR SCREENING: MRSA by PCR: NEGATIVE

## 2015-05-27 MED ORDER — HYDROMORPHONE HCL 1 MG/ML IJ SOLN
1.0000 mg | Freq: Once | INTRAMUSCULAR | Status: AC
  Administered 2015-05-27: 1 mg via INTRAVENOUS

## 2015-05-27 MED ORDER — AMLODIPINE BESYLATE 10 MG PO TABS
10.0000 mg | ORAL_TABLET | Freq: Every day | ORAL | Status: DC
  Administered 2015-05-27: 10 mg via ORAL
  Filled 2015-05-27: qty 1

## 2015-05-27 MED ORDER — CLONIDINE HCL 0.1 MG PO TABS
0.3000 mg | ORAL_TABLET | Freq: Two times a day (BID) | ORAL | Status: DC
  Administered 2015-05-27: 0.3 mg via ORAL
  Filled 2015-05-27 (×2): qty 3

## 2015-05-27 MED ORDER — LORAZEPAM 2 MG/ML IJ SOLN
0.5000 mg | Freq: Four times a day (QID) | INTRAMUSCULAR | Status: DC | PRN
  Administered 2015-05-27: 0.5 mg via INTRAVENOUS
  Filled 2015-05-27: qty 1

## 2015-05-27 MED ORDER — SODIUM CHLORIDE 0.9 % IV SOLN: INTRAVENOUS | Status: DC

## 2015-05-27 MED ORDER — PROCHLORPERAZINE EDISYLATE 5 MG/ML IJ SOLN
10.0000 mg | Freq: Four times a day (QID) | INTRAMUSCULAR | Status: DC | PRN
  Administered 2015-05-27 – 2015-05-28 (×3): 10 mg via INTRAVENOUS
  Filled 2015-05-27 (×4): qty 2

## 2015-05-27 MED ORDER — HEPARIN SODIUM (PORCINE) 5000 UNIT/ML IJ SOLN
5000.0000 [IU] | Freq: Three times a day (TID) | INTRAMUSCULAR | Status: DC
  Administered 2015-05-27 – 2015-05-28 (×4): 5000 [IU] via SUBCUTANEOUS
  Filled 2015-05-27 (×5): qty 1

## 2015-05-27 MED ORDER — DEXTROSE-NACL 5-0.45 % IV SOLN
INTRAVENOUS | Status: DC
  Administered 2015-05-27 – 2015-05-28 (×2): via INTRAVENOUS

## 2015-05-27 MED ORDER — HYDRALAZINE HCL 20 MG/ML IJ SOLN: 20.0000 mg | Freq: Once | INTRAMUSCULAR | Status: AC

## 2015-05-27 MED ORDER — HYDROMORPHONE HCL 1 MG/ML IJ SOLN
1.0000 mg | Freq: Once | INTRAMUSCULAR | Status: AC
  Administered 2015-05-27: 1 mg via INTRAVENOUS
  Filled 2015-05-27: qty 1

## 2015-05-27 MED ORDER — SODIUM CHLORIDE 0.9 % IV BOLUS (SEPSIS)
1000.0000 mL | Freq: Once | INTRAVENOUS | Status: AC
  Administered 2015-05-27: 1000 mL via INTRAVENOUS

## 2015-05-27 MED ORDER — HYDROMORPHONE HCL 1 MG/ML IJ SOLN
INTRAMUSCULAR | Status: AC
  Administered 2015-05-27: 1 mg via INTRAVENOUS
  Filled 2015-05-27: qty 1

## 2015-05-27 MED ORDER — POTASSIUM CHLORIDE 10 MEQ/100ML IV SOLN
10.0000 meq | INTRAVENOUS | Status: AC
  Administered 2015-05-27 (×2): 10 meq via INTRAVENOUS
  Filled 2015-05-27 (×2): qty 100

## 2015-05-27 MED ORDER — METOPROLOL TARTRATE 1 MG/ML IV SOLN
5.0000 mg | Freq: Four times a day (QID) | INTRAVENOUS | Status: DC
  Administered 2015-05-27 – 2015-05-28 (×4): 5 mg via INTRAVENOUS
  Filled 2015-05-27 (×4): qty 5

## 2015-05-27 MED ORDER — SODIUM CHLORIDE 0.9 % IV SOLN
INTRAVENOUS | Status: DC
  Administered 2015-05-27: 4.1 [IU]/h via INTRAVENOUS
  Administered 2015-05-27: 0.2 [IU]/h via INTRAVENOUS
  Administered 2015-05-27: 1.6 [IU]/h via INTRAVENOUS
  Administered 2015-05-27: 0.4 [IU]/h via INTRAVENOUS
  Administered 2015-05-28: 6.5 [IU]/h via INTRAVENOUS
  Administered 2015-05-28: 5.5 [IU]/h via INTRAVENOUS
  Filled 2015-05-27 (×2): qty 2.5

## 2015-05-27 MED ORDER — IRBESARTAN 75 MG PO TABS
150.0000 mg | ORAL_TABLET | Freq: Two times a day (BID) | ORAL | Status: DC
  Administered 2015-05-27: 150 mg via ORAL
  Filled 2015-05-27: qty 2

## 2015-05-27 MED ORDER — PANTOPRAZOLE SODIUM 40 MG IV SOLR
40.0000 mg | Freq: Two times a day (BID) | INTRAVENOUS | Status: DC
  Administered 2015-05-27 (×2): 40 mg via INTRAVENOUS
  Filled 2015-05-27 (×2): qty 40

## 2015-05-27 MED ORDER — LABETALOL HCL 5 MG/ML IV SOLN
10.0000 mg | INTRAVENOUS | Status: DC | PRN
  Administered 2015-05-27 (×2): 10 mg via INTRAVENOUS
  Filled 2015-05-27 (×3): qty 4

## 2015-05-27 MED ORDER — SODIUM CHLORIDE 0.9 % IV SOLN: INTRAVENOUS | Status: AC

## 2015-05-27 MED ORDER — HYDROMORPHONE HCL 1 MG/ML IJ SOLN
1.0000 mg | INTRAMUSCULAR | Status: DC | PRN
  Administered 2015-05-27 – 2015-05-28 (×6): 1 mg via INTRAVENOUS
  Filled 2015-05-27 (×6): qty 1

## 2015-05-27 MED ORDER — HYDRALAZINE HCL 20 MG/ML IJ SOLN
20.0000 mg | INTRAMUSCULAR | Status: DC | PRN
  Administered 2015-05-27 – 2015-05-29 (×4): 20 mg via INTRAVENOUS
  Filled 2015-05-27 (×5): qty 1

## 2015-05-27 MED ORDER — LABETALOL HCL 5 MG/ML IV SOLN
20.0000 mg | Freq: Once | INTRAVENOUS | Status: AC
  Administered 2015-05-27: 20 mg via INTRAVENOUS

## 2015-05-27 MED ORDER — SODIUM CHLORIDE 0.9 % IV SOLN
INTRAVENOUS | Status: DC
  Administered 2015-05-27: 10:00:00 via INTRAVENOUS

## 2015-05-27 MED ORDER — SODIUM CHLORIDE 0.9 % IV BOLUS (SEPSIS)
250.0000 mL | Freq: Once | INTRAVENOUS | Status: AC
  Administered 2015-05-27: 250 mL via INTRAVENOUS

## 2015-05-27 MED ORDER — PROMETHAZINE HCL 25 MG/ML IJ SOLN
25.0000 mg | Freq: Once | INTRAMUSCULAR | Status: AC
  Administered 2015-05-27: 25 mg via INTRAVENOUS
  Filled 2015-05-27: qty 1

## 2015-05-27 MED ORDER — HYDROMORPHONE HCL 1 MG/ML IJ SOLN
2.0000 mg | Freq: Once | INTRAMUSCULAR | Status: AC
Start: 2015-05-27 — End: 2015-05-27
  Administered 2015-05-27: 2 mg via INTRAVENOUS
  Filled 2015-05-27: qty 2

## 2015-05-27 NOTE — Progress Notes (Signed)
Pt remains alert and oriented on RA with O2 sats upper 90's; pt bp elevated during shift administered labetalol bp improved post administration; c/o pain administered dilaudid per md orders pt resting comfortably; plans for dialysis in the am; insulin drip still infusing will continue to monitor and assess pt

## 2015-05-27 NOTE — ED Notes (Signed)
Patient arrived via EMS for multiple episodes of vomiting with nausea. Patient states that he is a dialysis patient seen at [facility] and was seen yesterday for the same complaint. Patient was discharged home with dilaudid and phenergan. Patient states that the phenergan has not helped his nausea or vomiting. Patient states that he has had several episodes in the last 24 hours.

## 2015-05-27 NOTE — Progress Notes (Signed)
Notified Dr. Vianne Bulls pts. Systolic blood pressure remains in the 200's given orders to administer 20 mg iv push once

## 2015-05-27 NOTE — H&P (Signed)
[name] is an 34 y.o. male.   Chief Complaint: Feeling bad HPI: The patient presents emergency department complaining of nausea and vomiting. He states that he's been feeling bad for a few days now. He also complains of abdominal pain. Past history significant for end-stage renal disease on dialysis Tuesday, Thursday, Saturday. He was seen in the [place] emergency department yesterday and given pain medicine and antiemetics. Her evaluation on today's visit indicates diabetic ketoacidosis for which the emergency department staff called for admission  Past Medical History  Diagnosis Date  . Diabetes mellitus without complication   . Hypertension   . Renal disorder     Dialysis    Past Surgical History  Procedure Laterality Date  . Insertion of dialysis catheter      Right subclavian    Family History  Problem Relation Age of Onset  . Diabetes Mellitus II     Social History:  reports that he has never smoked. He does not have any smokeless tobacco history on file. He reports that he does not drink alcohol or use illicit drugs.  Allergies: No Known Allergies  Prior to Admission medications   Medication Sig Start Date End Date Taking? Authorizing Provider  promethazine (PHENERGAN) 25 MG tablet Take 1 tablet (25 mg total) by mouth every 6 (six) hours as needed for nausea or vomiting. 05/26/15   Tiffany Greene, PA-C     Results for orders placed or performed during the hospital encounter of 05/27/15 (from the past 48 hour(s))  CBC     Status: Abnormal   Collection Time: 05/27/15  4:08 AM  Result Value Ref Range   WBC 10.8 (H) 3.8 - 10.6 K/uL   RBC 3.79 (L) 4.40 - 5.90 MIL/uL   Hemoglobin 11.4 (L) 13.0 - 18.0 g/dL   HCT 34.7 (L) 40.0 - 52.0 %   MCV 91.5 80.0 - 100.0 fL   MCH 30.1 26.0 - 34.0 pg   MCHC 32.9 32.0 - 36.0 g/dL   RDW 15.3 (H) 11.5 - 14.5 %   Platelets 132 (L) 150 - 440 K/uL  Comprehensive metabolic panel     Status: Abnormal   Collection Time: 05/27/15  4:08  AM  Result Value Ref Range   Sodium 139 135 - 145 mmol/L   Potassium 4.1 3.5 - 5.1 mmol/L   Chloride 94 (L) 101 - 111 mmol/L   CO2 17 (L) 22 - 32 mmol/L   Glucose, Bld 375 (H) 65 - 99 mg/dL   BUN 44 (H) 6 - 20 mg/dL   Creatinine, Ser 9.48 (H) 0.61 - 1.24 mg/dL   Calcium 9.5 8.9 - 10.3 mg/dL   Total Protein 7.8 6.5 - 8.1 g/dL   Albumin 4.5 3.5 - 5.0 g/dL   AST 21 15 - 41 U/L   ALT 11 (L) 17 - 63 U/L   Alkaline Phosphatase 74 38 - 126 U/L   Total Bilirubin 2.0 (H) 0.3 - 1.2 mg/dL   GFR calc non Af Amer 6 (L) >60 mL/min   GFR calc Af Amer 7 (L) >60 mL/min    Comment: (NOTE) The eGFR has been calculated using the CKD EPI equation. This calculation has not been validated in all clinical situations. eGFR's persistently <60 mL/min signify possible Chronic Kidney Disease.    Anion gap 28 (H) 5 - 15  Lipase, blood     Status: None   Collection Time: 05/27/15  4:08 AM  Result Value Ref Range   Lipase 34 22 -   51 U/L  Troponin I     Status: None   Collection Time: 05/27/15  4:08 AM  Result Value Ref Range   Troponin I <0.03 <0.031 ng/mL    Comment:        NO INDICATION OF MYOCARDIAL INJURY.   Blood gas, venous     Status: Abnormal (Preliminary result)   Collection Time: 05/27/15  5:00 AM  Result Value Ref Range   FIO2 0.21    Mode ROOM AIR    pH, Ven 7.29 (L) 7.320 - 7.430   pCO2, Ven 35 (L) 44.0 - 60.0 mmHg   Bicarbonate 16.8 (L) 21.0 - 28.0 mEq/L   Acid-base deficit 8.9 (H) 0.0 - 2.0 mmol/L   O2 Saturation PENDING %   Patient temperature 37.0    Collection site LINE    Sample type VENOUS   Lactic acid, plasma     Status: None   Collection Time: 05/27/15  5:12 AM  Result Value Ref Range   Lactic Acid, Venous 1.5 0.5 - 2.0 mmol/L   No results found.  Review of Systems  Constitutional: Positive for malaise/fatigue. Negative for fever and chills.  HENT: Negative for sore throat and tinnitus.   Eyes: Negative for blurred vision and redness.  Respiratory: Positive for  shortness of breath. Negative for cough.   Cardiovascular: Negative for chest pain, palpitations, orthopnea and PND.  Gastrointestinal: Positive for nausea and vomiting. Negative for abdominal pain and diarrhea.  Genitourinary: Negative for dysuria, urgency and frequency.  Musculoskeletal: Negative for myalgias and joint pain.  Skin: Negative for rash.       No lesions  Neurological: Negative for speech change, focal weakness and weakness.  Endo/Heme/Allergies: Does not bruise/bleed easily.       No temperature intolerance  Psychiatric/Behavioral: Negative for depression and suicidal ideas.    Blood pressure 193/107, pulse 98, height 5' 7" (1.702 m), weight 90.719 kg (200 lb), SpO2 98 %. Physical Exam  Nursing note and vitals reviewed. Constitutional: He is oriented to person, place, and time. He appears well-developed and well-nourished. No distress.  HENT:  Head: Normocephalic and atraumatic.  Oropharynx tacky  Eyes: Conjunctivae and EOM are normal. Pupils are equal, round, and reactive to light. No scleral icterus.  Neck: Normal range of motion. Neck supple. No JVD present. No tracheal deviation present. No thyromegaly present.  Respiratory: Breath sounds normal. Tachypnea noted.  GI: Soft. Bowel sounds are normal. He exhibits no distension. There is no tenderness.  Genitourinary:  Deferred  Musculoskeletal: Normal range of motion. He exhibits no edema.  Lymphadenopathy:    He has no cervical adenopathy.  Neurological: He is oriented to person, place, and time. No cranial nerve deficit.  Groggy  Skin: Skin is warm and dry. No rash noted. No erythema.  Psychiatric: He has a normal mood and affect. His behavior is normal. Judgment and thought content normal.     Assessment/Plan 34 year old [place] American male admitted for diabetic ketoacidosis. 1. DKA: The patient will be made nothing by mouth and started on an insulin drip. As his anion gap closes we will start subcutaneous  insulin and allow the patient to eat. Potassium is within normal limits. 2. ESRD: Consult nephrology for dialysis scheduling  3. Diabetes mellitus type 2: Review insulin regimen for home and revise as needed once patient is off IV insulin 4. DVT prophylaxis: Heparin 5. GI prophylaxis: None The patient is a full code. Time spent on admission orders and critical patient care approximately 35 minutes  Harrie Foreman 05/27/2015, 5:57 AM

## 2015-05-27 NOTE — Progress Notes (Signed)
Dr. Vianne Bulls paged due to BP 217/123 and he had IV labetalol earlier and also notified of nausea and pain. Informed her Nephrology consult ordered by Dr. Mortimer Fries. New orders received for an abdominal x-ray and dilaudid 2 mg IV x 1.

## 2015-05-27 NOTE — Progress Notes (Signed)
[name]  ROUNDING NOTE   Subjective:  Pt presented with DKA. Currently on insulin gtt and IVF with 0.9 NS. He is a known HD patient. His usual days are TTHS at [place]. He attended his dialysis session on Saturday. Electrolytes acceptable.    Objective:  Vital signs in last 24 hours:  Temp:  [97.8 F (36.6 C)-98.6 F (37 C)] 98.6 F (37 C) (08/29 1300) Pulse Rate:  [96-100] 98 (08/29 1300) Resp:  [14-26] 14 (08/29 1300) BP: (107-218)/(58-119) 150/72 mmHg (08/29 1300) SpO2:  [91 %-100 %] 96 % (08/29 1300) Weight:  [89 kg (196 lb 3.4 oz)-90.719 kg (200 lb)] 89 kg (196 lb 3.4 oz) (08/29 0643)  Weight change:  Filed Weights   05/27/15 0311 05/27/15 0643  Weight: 90.719 kg (200 lb) 89 kg (196 lb 3.4 oz)    Intake/Output:     Intake/Output this shift:  Total I/O In: 104.4 [I.V.:4.4; IV Piggyback:100] Out: 2 [Emesis/NG output:2]  Physical Exam: General: NAD, laying in bed  Head: Normocephalic, atraumatic. Moist oral mucosal membranes  Eyes: Anicteric, PERRL  Neck: Supple, trachea midline  Lungs:  Clear to auscultation normal effort  Heart: Regular rate and rhythm  Abdomen:  Soft, nontender, BS present  Extremities: Trace peripheral edema.  Neurologic: Nonfocal, moving all four extremities  Skin: No lesions  Access: R IJ permcath    Basic Metabolic Panel:  Recent Labs Lab 05/26/15 1003 05/27/15 0408 05/27/15 0751  NA 141 139 139  K 3.9 4.1 4.4  CL 99* 94* 95*  CO2 24 17* 15*  GLUCOSE 235* 375* 486*  BUN 25* 44* 45*  CREATININE 7.26* 9.48* 9.88*  CALCIUM 9.9 9.5 9.1    Liver Function Tests:  Recent Labs Lab 05/26/15 1003 05/27/15 0408  AST 29 21  ALT 12* 11*  ALKPHOS 90 74  BILITOT 1.4* 2.0*  PROT 8.3* 7.8  ALBUMIN 4.9 4.5    Recent Labs Lab 05/26/15 1003 05/27/15 0408  LIPASE 45 34   No results for input(s): AMMONIA in the last 168 hours.  CBC:  Recent Labs Lab 05/26/15 1003 05/27/15 0408  WBC 9.6 10.8*   NEUTROABS 8.0*  --   HGB 12.6* 11.4*  HCT 37.4* 34.7*  MCV 88.8 91.5  PLT 159 132*    Cardiac Enzymes:  Recent Labs Lab 05/27/15 0408  TROPONINI <0.03    BNP: Invalid input(s): POCBNP  CBG:  Recent Labs Lab 05/27/15 0918 05/27/15 1002 05/27/15 1102 05/27/15 1218 05/27/15 1323  GLUCAP 431* 413* 318* 265* 205*    Microbiology: Results for orders placed or performed during the hospital encounter of 05/27/15  MRSA PCR Screening     Status: None   Collection Time: 05/27/15  7:00 AM  Result Value Ref Range Status   MRSA by PCR NEGATIVE NEGATIVE Final    Comment:        The GeneXpert MRSA Assay (FDA approved for NASAL specimens only), is one component of a comprehensive MRSA colonization surveillance program. It is not intended to diagnose MRSA infection nor to guide or monitor treatment for MRSA infections.     Coagulation Studies: No results for input(s): LABPROT, INR in the last 72 hours.  Urinalysis:  Recent Labs  05/26/15 1115  COLORURINE YELLOW  LABSPEC 1.016  PHURINE 8.0  GLUCOSEU 500*  HGBUR MODERATE*  BILIRUBINUR NEGATIVE  KETONESUR 15*  PROTEINUR >300*  UROBILINOGEN 0.2  NITRITE NEGATIVE  LEUKOCYTESUR NEGATIVE      Imaging: Dg Abd 1 View  05/27/2015   CLINICAL DATA:  Patient with nausea, vomiting and abdominal pain for 3 days.  EXAM: ABDOMEN - 1 VIEW  COMPARISON:  CT abdomen pelvis 07/30/2014  FINDINGS: Cardiomegaly. Lung bases are clear. Paucity of small bowel gas. No definite evidence for overt obstruction. No free intraperitoneal air. Lower lumbar spine degenerative changes.  IMPRESSION: Paucity of small bowel gas limits evaluation. No evidence for overt obstruction   Electronically Signed   By: Lovey Newcomer M.D.   On: 05/27/2015 12:06     Medications:   . sodium chloride Stopped (05/27/15 1440)  . dextrose 5 % and 0.45% NaCl 75 mL/hr at 05/27/15 1445  . insulin (NOVOLIN-R) infusion 3.1 Units/hr (05/27/15 1439)   . heparin   5,000 Units Subcutaneous 3 times per day  . metoprolol  5 mg Intravenous 4 times per day  . pantoprazole (PROTONIX) IV  40 mg Intravenous Q12H   HYDROmorphone (DILAUDID) injection, labetalol, prochlorperazine  Assessment/ Plan:  34 y.o. male with ESRD on HD TTHS at [place], multiple episodes of DKA, diabetes mellitus type one, hypertension, anemia of CKD, SHPTH, peripheral neuropathy  1.  ESRD on HD TTHS:  Pt had HD on Saturday, no acute indication for HD today, electrolytes and volume status assessed, will plan for HD again tomorrow.  2.  Diabetic ketoacidosis:  On insulin gtt and on D51/2NS, continue for now.  3.  Anemia of CKD: Hgb 11.4 at last check, hold off on epogen for now.  4.  SHPTH: evaluate bone mineral metabolism parameters tomorrow with HD.  Not on binders at the moment.   LOS: 0 Alesandra Smart 8/29/20162:53 PM

## 2015-05-27 NOTE — Progress Notes (Signed)
Pt arrived to the unit from ED on stretcher. Pt arrived at 0650 pt settled in bed initial vitals taken and settled in bed pt. MRSA swab obtained and sent to lab pain 9/10 BP elevated. MD notified with new orders for labetalol with systolic parameters. No new orders for pain management , will let am physician address

## 2015-05-27 NOTE — Progress Notes (Signed)
[facility] [place] NAME: [name]    MR#:  [id number]  DATE OF BIRTH:  1980-10-25  SUBJECTIVE: 34 year old male patient with ESRD on hemodialysis a,DMII,dmitted because of abdominal pain nausea vomiting found to have DKA. He has abdominal pain generalized associated with nausea and vomiting. Last dialysis was on Saturday.   CHIEF COMPLAINT:   Chief Complaint  Patient presents with  . Vomiting    Pt. here via EMS for nausea vomiting    REVIEW OF SYSTEMS:   Review of Systems  Constitutional: Negative for fever and chills.  HENT: Negative for hearing loss.   Eyes: Negative for blurred vision, double vision and photophobia.  Respiratory: Negative for cough, hemoptysis and shortness of breath.   Cardiovascular: Negative for palpitations, orthopnea and leg swelling.  Gastrointestinal: Positive for nausea, vomiting and abdominal pain. Negative for diarrhea.  Genitourinary: Negative for dysuria and urgency.  Musculoskeletal: Negative for myalgias and neck pain.  Skin: Negative for rash.  Neurological: Negative for dizziness, focal weakness, seizures, weakness and headaches.  Psychiatric/Behavioral: Negative for memory loss. The patient does not have insomnia.      DRUG ALLERGIES:  No Known Allergies  VITALS:  Blood pressure 107/69, pulse 100, temperature 98.4 F (36.9 C), temperature source Oral, resp. rate 21, height 5\' 7"  (1.702 m), weight 89 kg (196 lb 3.4 oz), SpO2 99 %.  PHYSICAL EXAMINATION:  GENERAL:  34 y.o.-year-old patient lying in the bed with no acute distress.  EYES: Pupils equal, round, reactive to light and accommodation. No scleral icterus. Extraocular muscles intact.  HEENT: Head atraumatic, normocephalic. Oropharynx and nasopharynx clear.  NECK:  Supple, no jugular venous distention. No thyroid enlargement, no tenderness.  LUNGS: Normal breath sounds bilaterally, no wheezing, rales,rhonchi or crepitation. No  use of accessory muscles of respiration.  CARDIOVASCULAR: S1, S2 normal. No murmurs, rubs, or gallops.  ABDOMEN: Diffuse epigastric tenderness present ,no rebound tenderness .no organomegaly. EXTREMITIES: No pedal edema, cyanosis, or clubbing.  NEUROLOGIC: Cranial nerves II through XII are intact. Muscle strength 5/5 in all extremities. Sensation intact. Gait not checked.  PSYCHIATRIC: The patient is alert and oriented x 3.  SKIN: No obvious rash, lesion, or ulcer.    LABORATORY PANEL:   CBC  Recent Labs Lab 05/27/15 0408  WBC 10.8*  HGB 11.4*  HCT 34.7*  PLT 132*   ------------------------------------------------------------------------------------------------------------------  Chemistries   Recent Labs Lab 05/27/15 0408 05/27/15 0751  NA 139 139  K 4.1 4.4  CL 94* 95*  CO2 17* 15*  GLUCOSE 375* 486*  BUN 44* 45*  CREATININE 9.48* 9.88*  CALCIUM 9.5 9.1  AST 21  --   ALT 11*  --   ALKPHOS 74  --   BILITOT 2.0*  --    ------------------------------------------------------------------------------------------------------------------  Cardiac Enzymes  Recent Labs Lab 05/27/15 0408  TROPONINI <0.03   ------------------------------------------------------------------------------------------------------------------  RADIOLOGY:  Dg Abd 1 View  05/27/2015   CLINICAL DATA:  Patient with nausea, vomiting and abdominal pain for 3 days.  EXAM: ABDOMEN - 1 VIEW  COMPARISON:  CT abdomen pelvis 07/30/2014  FINDINGS: Cardiomegaly. Lung bases are clear. Paucity of small bowel gas. No definite evidence for overt obstruction. No free intraperitoneal air. Lower lumbar spine degenerative changes.  IMPRESSION: Paucity of small bowel gas limits evaluation. No evidence for overt obstruction   Electronically Signed   By: Lovey Newcomer M.D.   On: 05/27/2015 12:06    EKG:   Orders placed or performed  in visit on 06/29/14  . EKG 12-Lead    ASSESSMENT AND PLAN:   1 DKA, with type  2 diabetes mellitus: Allen gap of 29 on admission. Continue insulin drip, IV fluids. Nephrology consult is for dialysis needs.  #2 abdominal pain nausea vomiting likely secondary to gastritis: Continue PPIs, add Zofran and Dilaudid. Possible narcotic seeking behavior. #3 uncontrolled hypertension secondary to abdominal pain: control  abdominal pain Continue labetalol IV, restart home medication Coreg, clonidine.  Hold Aldactone and valsartan secondary to DKA.     All the records are reviewed and case discussed with Care Management/Social Workerr. Management plans discussed with the patient, family and they are in agreement.  CODE STATUS: full  TOTAL TIME TAKING CARE OF THIS PATIENT: 35 minutes.   POSSIBLE D/C IN 1-2DAYS, DEPENDING ON CLINICAL CONDITION.   Epifanio Lesches M.D on 05/27/2015 at 12:33 PM  Between 7am to 6pm - Pager - [phone]  After 6pm go to [email or web address] - password [email or web address] [facility]  [facility]  Office  [phone]  CC: Primary care physician; No primary care provider on file.

## 2015-05-27 NOTE — ED Provider Notes (Signed)
[facility] Emergency Department Provider Note  ____________________________________________  Time seen: Approximately 310 AM  I have reviewed the triage vital signs and the nursing notes.   HISTORY  Chief Complaint Vomiting    HPI [name] is a 34 y.o. male with a history of end-stage renal disease on dialysis as well as diabetes. The patient reports that he's been having some abdominal pain for a couple of days. The patient reports that the pain is all around his stomach. He reports that he has had this pain in the past and he has been hospitalized for it. The patient reports that he normally gets Dilaudid for this pain and fluids. The patient was a [facility] yesterday and was told that he was better and discharged to home. The patient received dialysis on Saturday but reports at that time the pain was not that bad. He has not been eating but has been trying to drink. The patient reports that he has been vomiting yellow/green and he has had some diarrhea. The patient has never had knee abdominal surgeries in the past. He reports that his pain is a 10 out of 10 in intensity.   Past Medical History  Diagnosis Date  . Diabetes mellitus without complication   . Hypertension   . Renal disorder     Dialysis    Patient Active Problem List   Diagnosis Date Noted  . DKA (diabetic ketoacidoses) 05/27/2015    History reviewed. No pertinent past surgical history.  Current Outpatient Rx  Name  Route  Sig  Dispense  Refill  . promethazine (PHENERGAN) 25 MG tablet   Oral   Take 1 tablet (25 mg total) by mouth every 6 (six) hours as needed for nausea or vomiting.   30 tablet   0     Allergies Review of patient's allergies indicates no known allergies.  No family history on file.  Social History Social History  Substance Use Topics  . Smoking status: Never Smoker   . Smokeless tobacco: None  . Alcohol Use: No    Review of  Systems Constitutional: No fever/chills Eyes: No visual changes. ENT: No sore throat. Cardiovascular: Denies chest pain. Respiratory: Denies shortness of breath. Gastrointestinal: abdominal pain.  nausea,  vomiting.   diarrhea.  No constipation. Genitourinary: Negative for dysuria. Musculoskeletal: Negative for back pain. Skin: Negative for rash. Neurological: Negative for headaches, focal weakness or numbness.  10-point ROS otherwise negative.  ____________________________________________   PHYSICAL EXAM:  VITAL SIGNS: ED Triage Vitals  Enc Vitals Group     BP 05/27/15 0311 195/96 mmHg     Pulse Rate 05/27/15 0311 97     Resp --      Temp --      Temp src --      SpO2 05/27/15 0311 97 %     Weight 05/27/15 0311 200 lb (90.719 kg)     Height 05/27/15 0311 5\' 7"  (1.702 m)     Head Cir --      Peak Flow --      Pain Score 05/27/15 0313 10     Pain Loc --      Pain Edu? --      Excl. in [place]? --     Constitutional: Alert and oriented. ill appearing and in moderate distress. Eyes: Conjunctivae are normal. PERRL. EOMI. Head: Atraumatic. Nose: No congestion/rhinnorhea. Mouth/Throat: Mucous membranes are moist.  Oropharynx non-erythematous. Cardiovascular: Normal rate, regular rhythm. Grossly normal heart sounds.  Good  peripheral circulation. Respiratory: Normal respiratory effort.  No retractions. Lungs CTAB. Gastrointestinal: Soft and diffusely tender. No distention. Active bowel sounds Musculoskeletal: No lower extremity tenderness nor edema.   Neurologic:  Normal speech and language.  Skin:  Skin is warm, dry and intact.  Psychiatric: Mood and affect are normal.   ____________________________________________   LABS (all labs ordered are listed, but only abnormal results are displayed)  Labs Reviewed  CBC - Abnormal; Notable for the following:    WBC 10.8 (*)    RBC 3.79 (*)    Hemoglobin 11.4 (*)    HCT 34.7 (*)    RDW 15.3 (*)    Platelets 132 (*)    All  other components within normal limits  COMPREHENSIVE METABOLIC PANEL - Abnormal; Notable for the following:    Chloride 94 (*)    CO2 17 (*)    Glucose, Bld 375 (*)    BUN 44 (*)    Creatinine, Ser 9.48 (*)    ALT 11 (*)    Total Bilirubin 2.0 (*)    GFR calc non Af Amer 6 (*)    GFR calc Af Amer 7 (*)    Anion gap 28 (*)    All other components within normal limits  BLOOD GAS, VENOUS - Abnormal; Notable for the following:    pH, Ven 7.29 (*)    pCO2, Ven 35 (*)    Bicarbonate 16.8 (*)    Acid-base deficit 8.9 (*)    All other components within normal limits  LIPASE, BLOOD  TROPONIN I  LACTIC ACID, PLASMA  LACTIC ACID, PLASMA   ____________________________________________  EKG  None ____________________________________________  RADIOLOGY  None ____________________________________________   PROCEDURES  Procedure(s) performed: None  Critical Care performed: Yes, see critical care note(s)  CRITICAL CARE Performed by: Charlesetta Ivory P   Total critical care time: 30 min  Critical care time was exclusive of separately billable procedures and treating other patients.  Critical care was necessary to treat or prevent imminent or life-threatening deterioration.  Critical care was time spent personally by me on the following activities: development of treatment plan with patient and/or surrogate as well as nursing, discussions with consultants, evaluation of patient's response to treatment, examination of patient, obtaining history from patient or surrogate, ordering and performing treatments and interventions, ordering and review of laboratory studies, ordering and review of radiographic studies, pulse oximetry and re-evaluation of patient's condition.  ____________________________________________   INITIAL IMPRESSION / ASSESSMENT AND PLAN / ED COURSE  Pertinent labs & imaging results that were available during my care of the patient were reviewed by me and  considered in my medical decision making (see chart for details).  This is a 34 year old male who comes in today with abdominal pain vomiting and diarrhea. I did perform some blood work to evaluate the patient. The patient has been admitted to the hospital multiple times with diabetic ketoacidosis in the past. Given that the patient has a bicarbonate of 17 and an anion gap of 28 I am concerned that the patient is again in diabetic ketoacidosis. I initially gave the patient 250 ML's of normal saline but I then added another liter of normal saline. The patient will be placed on an insulin drip at 0.1 units per kilogram per hour until his anion gap closes and his bicarbonate has stabilized. I did give the patient a dose of Dilaudid as well as Phenergan for his pain. The patient will be admitted to the hospitalist service in  the intensive care unit. ____________________________________________   FINAL CLINICAL IMPRESSION(S) / ED DIAGNOSES  Final diagnoses:  Generalized abdominal pain  Diabetic ketoacidosis without coma associated with type 1 diabetes mellitus      Loney Hering, MD 05/27/15 [phone]

## 2015-05-27 NOTE — Progress Notes (Signed)
Attempted to speak with patient regarding diabetes management.  He states that he takes Lantus 6 units daily and Regular 6 units tid with meals at home.  Patient continues to c/o pain.  He is currently being treated for DKA.  Will follow.  Thanks, Adah Perl, RN, BC-ADM Inpatient Diabetes Coordinator Pager [phone] (8a-5p)

## 2015-05-27 NOTE — ED Notes (Signed)
Pt. States he was at [facility] yesterday for same symptoms.  Pt. States abdominal pain is all over abdominal area.  Pt. States phenergan tablets not working to control nausea.  Pt. States vomiting multiple times at home.  Pt. Also states diarrhea.  Pt. Has not vomited with EMS nor in the ED.  Pt. States dilaudid is the only thing that controls the pain.

## 2015-05-28 LAB — BASIC METABOLIC PANEL
Anion gap: 19 — ABNORMAL HIGH (ref 5–15)
Anion gap: 19 — ABNORMAL HIGH (ref 5–15)
Anion gap: 19 — ABNORMAL HIGH (ref 5–15)
Anion gap: 25 — ABNORMAL HIGH (ref 5–15)
BUN: 52 mg/dL — ABNORMAL HIGH (ref 6–20)
BUN: 55 mg/dL — ABNORMAL HIGH (ref 6–20)
BUN: 56 mg/dL — ABNORMAL HIGH (ref 6–20)
BUN: 54 mg/dL — ABNORMAL HIGH (ref 6–20)
CO2: 22 mmol/L (ref 22–32)
CO2: 20 mmol/L — ABNORMAL LOW (ref 22–32)
CO2: 21 mmol/L — ABNORMAL LOW (ref 22–32)
CO2: 20 mmol/L — ABNORMAL LOW (ref 22–32)
Calcium: 9.2 mg/dL (ref 8.9–10.3)
Calcium: 8.9 mg/dL (ref 8.9–10.3)
Calcium: 9 mg/dL (ref 8.9–10.3)
Calcium: 9.2 mg/dL (ref 8.9–10.3)
Chloride: 100 mmol/L — ABNORMAL LOW (ref 101–111)
Chloride: 99 mmol/L — ABNORMAL LOW (ref 101–111)
Chloride: 99 mmol/L — ABNORMAL LOW (ref 101–111)
Chloride: 93 mmol/L — ABNORMAL LOW (ref 101–111)
Creatinine, Ser: 10.83 mg/dL — ABNORMAL HIGH (ref 0.61–1.24)
Creatinine, Ser: 11.17 mg/dL — ABNORMAL HIGH (ref 0.61–1.24)
Creatinine, Ser: 12.39 mg/dL — ABNORMAL HIGH (ref 0.61–1.24)
Creatinine, Ser: 12.53 mg/dL — ABNORMAL HIGH (ref 0.61–1.24)
GFR calc Af Amer: 6 mL/min — ABNORMAL LOW (ref >60)
GFR calc Af Amer: 6 mL/min — ABNORMAL LOW (ref >60)
GFR calc Af Amer: 5 mL/min — ABNORMAL LOW (ref >60)
GFR calc Af Amer: 5 mL/min — ABNORMAL LOW (ref >60)
GFR calc non Af Amer: 5 mL/min — ABNORMAL LOW (ref >60)
GFR calc non Af Amer: 5 mL/min — ABNORMAL LOW (ref >60)
GFR calc non Af Amer: 5 mL/min — ABNORMAL LOW (ref >60)
GFR calc non Af Amer: 5 mL/min — ABNORMAL LOW (ref >60)
Glucose, Bld: 185 mg/dL — ABNORMAL HIGH (ref 65–99)
Glucose, Bld: 227 mg/dL — ABNORMAL HIGH (ref 65–99)
Glucose, Bld: 196 mg/dL — ABNORMAL HIGH (ref 65–99)
Glucose, Bld: 227 mg/dL — ABNORMAL HIGH (ref 65–99)
Potassium: 3.8 mmol/L (ref 3.5–5.1)
Potassium: 4 mmol/L (ref 3.5–5.1)
Potassium: 3.9 mmol/L (ref 3.5–5.1)
Potassium: 4 mmol/L (ref 3.5–5.1)
Sodium: 141 mmol/L (ref 135–145)
Sodium: 138 mmol/L (ref 135–145)
Sodium: 139 mmol/L (ref 135–145)
Sodium: 138 mmol/L (ref 135–145)

## 2015-05-28 LAB — GLUCOSE, CAPILLARY
Glucose-Capillary: 101 mg/dL — ABNORMAL HIGH (ref 65–99)
Glucose-Capillary: 120 mg/dL — ABNORMAL HIGH (ref 65–99)
Glucose-Capillary: 122 mg/dL — ABNORMAL HIGH (ref 65–99)
Glucose-Capillary: 123 mg/dL — ABNORMAL HIGH (ref 65–99)
Glucose-Capillary: 128 mg/dL — ABNORMAL HIGH (ref 65–99)
Glucose-Capillary: 132 mg/dL — ABNORMAL HIGH (ref 65–99)
Glucose-Capillary: 134 mg/dL — ABNORMAL HIGH (ref 65–99)
Glucose-Capillary: 157 mg/dL — ABNORMAL HIGH (ref 65–99)
Glucose-Capillary: 166 mg/dL — ABNORMAL HIGH (ref 65–99)
Glucose-Capillary: 166 mg/dL — ABNORMAL HIGH (ref 65–99)
Glucose-Capillary: 190 mg/dL — ABNORMAL HIGH (ref 65–99)
Glucose-Capillary: 197 mg/dL — ABNORMAL HIGH (ref 65–99)
Glucose-Capillary: 200 mg/dL — ABNORMAL HIGH (ref 65–99)
Glucose-Capillary: 208 mg/dL — ABNORMAL HIGH (ref 65–99)
Glucose-Capillary: 216 mg/dL — ABNORMAL HIGH (ref 65–99)
Glucose-Capillary: 250 mg/dL — ABNORMAL HIGH (ref 65–99)
Glucose-Capillary: 260 mg/dL — ABNORMAL HIGH (ref 65–99)
Glucose-Capillary: 266 mg/dL — ABNORMAL HIGH (ref 65–99)
Glucose-Capillary: 301 mg/dL — ABNORMAL HIGH (ref 65–99)
Glucose-Capillary: 90 mg/dL (ref 65–99)
Glucose-Capillary: 98 mg/dL (ref 65–99)
Glucose-Capillary: 99 mg/dL (ref 65–99)

## 2015-05-28 LAB — PHOSPHORUS
Phosphorus: 5 mg/dL — ABNORMAL HIGH (ref 2.5–4.6)
Phosphorus: 5.3 mg/dL — ABNORMAL HIGH (ref 2.5–4.6)

## 2015-05-28 MED ORDER — SODIUM CHLORIDE 0.9 % IV SOLN
INTRAVENOUS | Status: DC
  Administered 2015-05-28: 6.2 [IU]/h via INTRAVENOUS
  Filled 2015-05-28: qty 2.5

## 2015-05-28 MED ORDER — LIDOCAINE-PRILOCAINE 2.5-2.5 % EX CREA: 1.0000 "application " | TOPICAL_CREAM | CUTANEOUS | Status: DC | PRN

## 2015-05-28 MED ORDER — AMLODIPINE BESYLATE 10 MG PO TABS
10.0000 mg | ORAL_TABLET | Freq: Every day | ORAL | Status: DC
  Administered 2015-05-29: 10 mg via ORAL
  Filled 2015-05-28: qty 1

## 2015-05-28 MED ORDER — IRBESARTAN 75 MG PO TABS
150.0000 mg | ORAL_TABLET | Freq: Every day | ORAL | Status: DC
  Administered 2015-05-29: 150 mg via ORAL
  Filled 2015-05-28: qty 2

## 2015-05-28 MED ORDER — DEXTROSE-NACL 5-0.45 % IV SOLN
INTRAVENOUS | Status: DC
  Administered 2015-05-28: via INTRAVENOUS

## 2015-05-28 MED ORDER — PANTOPRAZOLE SODIUM 40 MG PO TBEC
40.0000 mg | DELAYED_RELEASE_TABLET | Freq: Two times a day (BID) | ORAL | Status: DC
  Administered 2015-05-28 – 2015-05-29 (×3): 40 mg via ORAL
  Filled 2015-05-28 (×3): qty 1

## 2015-05-28 MED ORDER — PENTAFLUOROPROP-TETRAFLUOROETH EX AERO: 1.0000 "application " | INHALATION_SPRAY | CUTANEOUS | Status: DC | PRN

## 2015-05-28 MED ORDER — SODIUM CHLORIDE 0.9 % IV SOLN
INTRAVENOUS | Status: DC
  Administered 2015-05-28: 20:00:00 via INTRAVENOUS

## 2015-05-28 MED ORDER — CLONIDINE HCL 0.1 MG PO TABS
0.3000 mg | ORAL_TABLET | Freq: Two times a day (BID) | ORAL | Status: DC
  Administered 2015-05-28 – 2015-05-29 (×3): 0.3 mg via ORAL
  Filled 2015-05-28 (×2): qty 3

## 2015-05-28 MED ORDER — SODIUM CHLORIDE 0.9 % IV SOLN: 100.0000 mL | INTRAVENOUS | Status: DC | PRN

## 2015-05-28 MED ORDER — LIDOCAINE HCL (PF) 1 % IJ SOLN: 5.0000 mL | INTRAMUSCULAR | Status: DC | PRN

## 2015-05-28 MED ORDER — MINOXIDIL 10 MG PO TABS
10.0000 mg | ORAL_TABLET | Freq: Every day | ORAL | Status: DC
  Administered 2015-05-29: 10 mg via ORAL
  Filled 2015-05-28: qty 1

## 2015-05-28 MED ORDER — CARVEDILOL 25 MG PO TABS
25.0000 mg | ORAL_TABLET | Freq: Two times a day (BID) | ORAL | Status: DC
  Administered 2015-05-29: 25 mg via ORAL
  Filled 2015-05-28: qty 1

## 2015-05-28 MED ORDER — INSULIN GLARGINE 100 UNIT/ML ~~LOC~~ SOLN
12.0000 [IU] | Freq: Every day | SUBCUTANEOUS | Status: DC
  Administered 2015-05-29: 12 [IU] via SUBCUTANEOUS
  Filled 2015-05-28 (×3): qty 0.12

## 2015-05-28 MED ORDER — INSULIN ASPART 100 UNIT/ML ~~LOC~~ SOLN
8.0000 [IU] | Freq: Three times a day (TID) | SUBCUTANEOUS | Status: DC
  Filled 2015-05-28: qty 8

## 2015-05-28 MED ORDER — ALTEPLASE 2 MG IJ SOLR: 2.0000 mg | Freq: Once | INTRAMUSCULAR | Status: DC | PRN

## 2015-05-28 MED ORDER — NEPRO/CARBSTEADY PO LIQD: 237.0000 mL | ORAL | Status: DC | PRN

## 2015-05-28 MED ORDER — HEPARIN SODIUM (PORCINE) 1000 UNIT/ML DIALYSIS
1000.0000 [IU] | INTRAMUSCULAR | Status: DC | PRN
  Administered 2015-05-28: 3600 [IU] via INTRAVENOUS_CENTRAL

## 2015-05-28 MED ORDER — METOCLOPRAMIDE HCL 10 MG PO TABS
5.0000 mg | ORAL_TABLET | Freq: Three times a day (TID) | ORAL | Status: DC
  Administered 2015-05-29 (×2): 5 mg via ORAL
  Filled 2015-05-28 (×2): qty 1

## 2015-05-28 NOTE — Progress Notes (Signed)
Patient is having nausea and gagging as if he is going to vomit. PRN compazine given.

## 2015-05-28 NOTE — Progress Notes (Signed)
Inpatient Diabetes Program Recommendations  AACE/ADA: New Consensus Statement on Inpatient Glycemic Control (2013)  Target Ranges:  Prepandial:   less than 140 mg/dL      Peak postprandial:   less than 180 mg/dL (1-2 hours)      Critically ill patients:  140 - 180 mg/dL   Reason for Visit: DKA  Diabetes history: Type 1 Outpatient Diabetes medications: Lantus 12 units qhs, Humalog 8 units tid  Current orders for Inpatient glycemic control: Insulin in D5 1/2NS at 6u/hour  Currently.    Once the acidosis is cleared (Anion gap is less than 12 and the CO2 is greater than 20)- call MD for Lantus order and Phase 2 orders. Give Lantus(basal) and then 2 hours later,  transition off insulin drip.  Give correction insulin (Novolog) at the time the drip is stopped.   Once you've transitioned off the drip, d/c Phase 1 orders- only one phase of order set at a time  Gentry Fitz, RN, [place], [place], CDE Diabetes Coordinator Inpatient Diabetes Program  [phone] (Team Pager) [phone] ([place]) 05/28/2015 8:23 AM

## 2015-05-28 NOTE — Progress Notes (Signed)
[name]  ROUNDING NOTE   Subjective:  Pt continues to have abdominal pain. No vomiting this AM.  Still having some nausea though. Due for HD today.  Objective:  Vital signs in last 24 hours:  Temp:  [98.4 F (36.9 C)-99.1 F (37.3 C)] 98.4 F (36.9 C) (08/30 0756) Pulse Rate:  [85-100] 95 (08/30 0900) Resp:  [7-21] 21 (08/30 0900) BP: (114-207)/(50-146) 178/70 mmHg (08/30 0900) SpO2:  [92 %-100 %] 99 % (08/30 0900)  Weight change:  Filed Weights   05/27/15 0311 05/27/15 0643  Weight: 90.719 kg (200 lb) 89 kg (196 lb 3.4 oz)    Intake/Output: I/O last 3 completed shifts: In: 1356.1 [I.V.:1256.1; IV Piggyback:100] Out: 4 [Emesis/NG output:4]   Intake/Output this shift:     Physical Exam: General: NAD, laying in bed  Head: Normocephalic, atraumatic. Moist oral mucosal membranes  Eyes: Anicteric, PERRL  Neck: Supple, trachea midline  Lungs:  Clear to auscultation normal effort  Heart: Regular rate and rhythm  Abdomen:  Soft, nontender, BS present  Extremities: Trace peripheral edema.  Neurologic: Nonfocal, moving all four extremities  Skin: No lesions  Access: R IJ permcath    Basic Metabolic Panel:  Recent Labs Lab 05/27/15 0408 05/27/15 0751 05/27/15 1822 05/27/15 2326 05/28/15 0532  NA 139 139 142 141 138  K 4.1 4.4 4.3 3.8 4.0  CL 94* 95* 101 100* 99*  CO2 17* 15* 19* 22 20*  GLUCOSE 375* 486* 126* 185* 227*  BUN 44* 45* 52* 52* 55*  CREATININE 9.48* 9.88* 10.95* 10.83* 11.17*  CALCIUM 9.5 9.1 9.5 9.2 8.9    Liver Function Tests:  Recent Labs Lab 05/26/15 1003 05/27/15 0408  AST 29 21  ALT 12* 11*  ALKPHOS 90 74  BILITOT 1.4* 2.0*  PROT 8.3* 7.8  ALBUMIN 4.9 4.5    Recent Labs Lab 05/26/15 1003 05/27/15 0408  LIPASE 45 34   No results for input(s): AMMONIA in the last 168 hours.  CBC:  Recent Labs Lab 05/26/15 1003 05/27/15 0408  WBC 9.6 10.8*  NEUTROABS 8.0*  --   HGB 12.6* 11.4*  HCT 37.4* 34.7*   MCV 88.8 91.5  PLT 159 132*    Cardiac Enzymes:  Recent Labs Lab 05/27/15 0408  TROPONINI <0.03    BNP: Invalid input(s): POCBNP  CBG:  Recent Labs Lab 05/28/15 0346 05/28/15 0446 05/28/15 0548 05/28/15 0650 05/28/15 0854  GLUCAP 166* 166* 208* 301* 197*    Microbiology: Results for orders placed or performed during the hospital encounter of 05/27/15  MRSA PCR Screening     Status: None   Collection Time: 05/27/15  7:00 AM  Result Value Ref Range Status   MRSA by PCR NEGATIVE NEGATIVE Final    Comment:        The GeneXpert MRSA Assay (FDA approved for NASAL specimens only), is one component of a comprehensive MRSA colonization surveillance program. It is not intended to diagnose MRSA infection nor to guide or monitor treatment for MRSA infections.     Coagulation Studies: No results for input(s): LABPROT, INR in the last 72 hours.  Urinalysis:  Recent Labs  05/26/15 1115  COLORURINE YELLOW  LABSPEC 1.016  PHURINE 8.0  GLUCOSEU 500*  HGBUR MODERATE*  BILIRUBINUR NEGATIVE  KETONESUR 15*  PROTEINUR >300*  UROBILINOGEN 0.2  NITRITE NEGATIVE  LEUKOCYTESUR NEGATIVE      Imaging: Dg Abd 1 View  05/27/2015   CLINICAL DATA:  Patient with nausea, vomiting and abdominal pain for  3 days.  EXAM: ABDOMEN - 1 VIEW  COMPARISON:  CT abdomen pelvis 07/30/2014  FINDINGS: Cardiomegaly. Lung bases are clear. Paucity of small bowel gas. No definite evidence for overt obstruction. No free intraperitoneal air. Lower lumbar spine degenerative changes.  IMPRESSION: Paucity of small bowel gas limits evaluation. No evidence for overt obstruction   Electronically Signed   By: Lovey Newcomer M.D.   On: 05/27/2015 12:06     Medications:   . sodium chloride Stopped (05/27/15 1440)  . dextrose 5 % and 0.45% NaCl 75 mL/hr at 05/28/15 0400  . insulin (NOVOLIN-R) infusion 5.5 Units/hr (05/28/15 0857)   . amLODipine  10 mg Oral Daily  . cloNIDine  0.3 mg Oral BID  .  heparin  5,000 Units Subcutaneous 3 times per day  . irbesartan  150 mg Oral BID  . metoprolol  5 mg Intravenous 4 times per day  . pantoprazole (PROTONIX) IV  40 mg Intravenous Q12H   hydrALAZINE, HYDROmorphone (DILAUDID) injection, LORazepam, prochlorperazine  Assessment/ Plan:  34 y.o. male with ESRD on HD TTHS at [place], multiple episodes of DKA, diabetes mellitus type one, hypertension, anemia of CKD, SHPTH, peripheral neuropathy  1.  ESRD on HD TTHS:  Pt due for HD today, orders prepared, will plan for UF target of 1.5kg today.  2.  Diabetic ketoacidosis:  Still on insulin gtt at the moment , currently 5.5 units IV/hr.   3.  Anemia of CKD: Hgb 11.4 at last check, no epogen today.  4.  SHPTH: check phos and PTH today with HD.   5.  Hypertension:  BP has been quite variable, most recent BP was 178/70, currently on amlodipine, clonidine, irbesartan and metoprolol.   LOS: 1 [name] 8/30/20169:37 AM

## 2015-05-28 NOTE — Progress Notes (Addendum)
[facility] Progress Note Patient Name: [name] DOB: [birth year] MRN: [id number]   Date of Service  05/28/2015  HPI/Events of Note  RN noted black stool.   eICU Interventions  Ordered Hb now, and AM CBC.      Intervention Category Minor Interventions: Clinical assessment - ordering diagnostic tests  Laverle Hobby 05/28/2015, 9:22 PM

## 2015-05-28 NOTE — Progress Notes (Signed)
Patient's blood pressure 214/146. PRN hydralazine give. Patient also complaining of abdominal pain and requesting dilaudid which was given. Patient receiving hemodialysis. Dr. Vianne Bulls discontinued insulin drip.

## 2015-05-28 NOTE — Progress Notes (Signed)
[facility] [place] NAME: [name]    MR#:  [id number]  DATE OF BIRTH:  April 06, 1981  SUBJECTIVE: 34 year old male patient with ESRD on hemodialysis a,DMII,dmitted because of abdominal pain nausea vomiting found to have DKA. His abdominal pain is better now.but nausea still there,on insulin drip 3 units/hr,received HD today.  CHIEF COMPLAINT:   Chief Complaint  Patient presents with  . Vomiting    Pt. here via EMS for nausea vomiting    REVIEW OF SYSTEMS:   Review of Systems  Constitutional: Negative for fever and chills.  HENT: Negative for hearing loss.   Eyes: Negative for blurred vision, double vision and photophobia.  Respiratory: Negative for cough, hemoptysis and shortness of breath.   Cardiovascular: Negative for palpitations, orthopnea and leg swelling.  Gastrointestinal: Positive for nausea, vomiting and abdominal pain. Negative for diarrhea.  Genitourinary: Negative for dysuria and urgency.  Musculoskeletal: Negative for myalgias and neck pain.  Skin: Negative for rash.  Neurological: Negative for dizziness, focal weakness, seizures, weakness and headaches.  Psychiatric/Behavioral: Negative for memory loss. The patient does not have insomnia.      DRUG ALLERGIES:  No Known Allergies  VITALS:  Blood pressure 181/98, pulse 94, temperature 98.4 F (36.9 C), temperature source Oral, resp. rate 16, height 5\' 7"  (1.702 m), weight 89 kg (196 lb 3.4 oz), SpO2 94 %.  PHYSICAL EXAMINATION:  GENERAL:  34 y.o.-year-old patient lying in the bed with no acute distress.  EYES: Pupils equal, round, reactive to light and accommodation. No scleral icterus. Extraocular muscles intact.  HEENT: Head atraumatic, normocephalic. Oropharynx and nasopharynx clear.  NECK:  Supple, no jugular venous distention. No thyroid enlargement, no tenderness.  LUNGS: Normal breath sounds bilaterally, no wheezing, rales,rhonchi or crepitation. No  use of accessory muscles of respiration.  CARDIOVASCULAR: S1, S2 normal. No murmurs, rubs, or gallops.  ABDOMEN: Diffuse epigastric tenderness present ,no rebound tenderness .no organomegaly. EXTREMITIES: No pedal edema, cyanosis, or clubbing.  NEUROLOGIC: Cranial nerves II through XII are intact. Muscle strength 5/5 in all extremities. Sensation intact. Gait not checked.  PSYCHIATRIC: The patient is alert and oriented x 3.  SKIN: No obvious rash, lesion, or ulcer.    LABORATORY PANEL:   CBC  Recent Labs Lab 05/27/15 0408  WBC 10.8*  HGB 11.4*  HCT 34.7*  PLT 132*   ------------------------------------------------------------------------------------------------------------------  Chemistries   Recent Labs Lab 05/27/15 0408  05/28/15 0949  NA 139  < > 139  K 4.1  < > 3.9  CL 94*  < > 99*  CO2 17*  < > 21*  GLUCOSE 375*  < > 196*  BUN 44*  < > 56*  CREATININE 9.48*  < > 12.39*  CALCIUM 9.5  < > 9.0  AST 21  --   --   ALT 11*  --   --   ALKPHOS 74  --   --   BILITOT 2.0*  --   --   < > = values in this interval not displayed. ------------------------------------------------------------------------------------------------------------------  Cardiac Enzymes  Recent Labs Lab 05/27/15 0408  TROPONINI <0.03   ------------------------------------------------------------------------------------------------------------------  RADIOLOGY:  Dg Abd 1 View  05/27/2015   CLINICAL DATA:  Patient with nausea, vomiting and abdominal pain for 3 days.  EXAM: ABDOMEN - 1 VIEW  COMPARISON:  CT abdomen pelvis 07/30/2014  FINDINGS: Cardiomegaly. Lung bases are clear. Paucity of small bowel gas. No definite evidence for overt obstruction. No free intraperitoneal air. Lower  lumbar spine degenerative changes.  IMPRESSION: Paucity of small bowel gas limits evaluation. No evidence for overt obstruction   Electronically Signed   By: Lovey Newcomer M.D.   On: 05/27/2015 12:06    EKG:   Orders  placed or performed in visit on 06/29/14  . EKG 12-Lead    ASSESSMENT AND PLAN:   1 DKA, with type 2 diabetes mellitus: Anion gap of 29 on admission.  insulin drip, IV fluids.d/c insulin drip as co2 more than 20,BG is mm  Around 100;start clears, Give lantus and d/c insulin drip, #2 abdominal pain nausea vomiting likely secondary to gastritis: Continue PPIs, add Zofran and Dilaudid.possible narcotic seeking bahavior #3 uncontrolled hypertension secondary to abdominal pain; Abdominal pain/nausea are better today,will start home meds 4.ESRD;;ON HD      All the records are reviewed and case discussed with Care Management/Social Workerr. Management plans discussed with the patient, family and they are in agreement.  CODE STATUS: full  TOTAL TIME TAKING CARE OF THIS PATIENT: 35 minutes.   POSSIBLE D/C IN 1-2DAYS, DEPENDING ON CLINICAL CONDITION.   Epifanio Lesches M.D on 05/28/2015 at 5:55 PM  Between 7am to 6pm - Pager - [phone]  After 6pm go to [email or web address] - password [email or web address] [facility]  [facility]  Office  [phone]  CC: Primary care physician; No primary care provider on file.

## 2015-05-28 NOTE — Progress Notes (Signed)
Patient in bed with eyes closed but arouses to name. Insulin gtt continues and is running at 3 ml/hr. Dilaudid given earlier for complaint of abdominal pain.

## 2015-05-29 DIAGNOSIS — Z992 Dependence on renal dialysis: Secondary | ICD-10-CM | POA: Diagnosis not present

## 2015-05-29 DIAGNOSIS — N186 End stage renal disease: Secondary | ICD-10-CM | POA: Diagnosis not present

## 2015-05-29 DIAGNOSIS — I120 Hypertensive chronic kidney disease with stage 5 chronic kidney disease or end stage renal disease: Secondary | ICD-10-CM | POA: Diagnosis not present

## 2015-05-29 LAB — GLUCOSE, CAPILLARY
Glucose-Capillary: 101 mg/dL — ABNORMAL HIGH (ref 65–99)
Glucose-Capillary: 111 mg/dL — ABNORMAL HIGH (ref 65–99)
Glucose-Capillary: 118 mg/dL — ABNORMAL HIGH (ref 65–99)
Glucose-Capillary: 156 mg/dL — ABNORMAL HIGH (ref 65–99)
Glucose-Capillary: 70 mg/dL (ref 65–99)
Glucose-Capillary: 82 mg/dL (ref 65–99)
Glucose-Capillary: 93 mg/dL (ref 65–99)

## 2015-05-29 LAB — BASIC METABOLIC PANEL
Anion gap: 13 (ref 5–15)
BUN: 28 mg/dL — ABNORMAL HIGH (ref 6–20)
CO2: 29 mmol/L (ref 22–32)
Calcium: 8.7 mg/dL — ABNORMAL LOW (ref 8.9–10.3)
Chloride: 99 mmol/L — ABNORMAL LOW (ref 101–111)
Creatinine, Ser: 7.31 mg/dL — ABNORMAL HIGH (ref 0.61–1.24)
GFR calc Af Amer: 10 mL/min — ABNORMAL LOW (ref >60)
GFR calc non Af Amer: 9 mL/min — ABNORMAL LOW (ref >60)
Glucose, Bld: 82 mg/dL (ref 65–99)
Potassium: 3.7 mmol/L (ref 3.5–5.1)
Sodium: 141 mmol/L (ref 135–145)

## 2015-05-29 LAB — PARATHYROID HORMONE, INTACT (NO CA): PTH: 225 pg/mL — ABNORMAL HIGH (ref 15–65)

## 2015-05-29 MED ORDER — INSULIN GLARGINE 100 UNIT/ML ~~LOC~~ SOLN: 12.0000 [IU] | Freq: Every day | SUBCUTANEOUS | Status: DC

## 2015-05-29 MED ORDER — INSULIN ASPART 100 UNIT/ML ~~LOC~~ SOLN
4.0000 [IU] | Freq: Three times a day (TID) | SUBCUTANEOUS | Status: DC
  Administered 2015-05-29: 4 [IU] via SUBCUTANEOUS

## 2015-05-29 MED ORDER — HYDROCODONE-ACETAMINOPHEN 5-325 MG PO TABS: 1.0000 | ORAL_TABLET | Freq: Four times a day (QID) | ORAL | Status: DC | PRN

## 2015-05-29 MED ORDER — INSULIN ASPART 100 UNIT/ML ~~LOC~~ SOLN
0.0000 [IU] | Freq: Three times a day (TID) | SUBCUTANEOUS | Status: DC
  Administered 2015-05-29: 3 [IU] via SUBCUTANEOUS

## 2015-05-29 MED ORDER — DEXTROSE 50 % IV SOLN
INTRAVENOUS | Status: AC
  Administered 2015-05-29: 15 mL
  Filled 2015-05-29: qty 50

## 2015-05-29 MED ORDER — INSULIN ASPART 100 UNIT/ML ~~LOC~~ SOLN: 0.0000 [IU] | Freq: Every day | SUBCUTANEOUS | Status: DC

## 2015-05-29 MED ORDER — HYDROCODONE-ACETAMINOPHEN 5-325 MG PO TABS
1.0000 | ORAL_TABLET | Freq: Four times a day (QID) | ORAL | Status: DC | PRN
  Administered 2015-05-29: 1 via ORAL
  Filled 2015-05-29: qty 1

## 2015-05-29 MED ORDER — PANTOPRAZOLE SODIUM 40 MG PO TBEC: 40.0000 mg | DELAYED_RELEASE_TABLET | Freq: Two times a day (BID) | ORAL | Status: DC

## 2015-05-29 NOTE — Progress Notes (Signed)
Inpatient Diabetes Program Recommendations  AACE/ADA: New Consensus Statement on Inpatient Glycemic Control (2013)  Target Ranges:  Prepandial:   less than 140 mg/dL      Peak postprandial:   less than 180 mg/dL (1-2 hours)      Critically ill patients:  140 - 180 mg/dL   Results for [name], [name] (MRN [id number]) as of 05/29/2015 08:30  Ref. Range 05/28/2015 22:11 05/28/2015 23:17 05/29/2015 00:18 05/29/2015 01:19 05/29/2015 01:38 05/29/2015 04:05 05/29/2015 07:53  Glucose-Capillary Latest Ref Range: 65-99 mg/dL 157 (H) 93 82 70 118 (H) 111 (H) 101 (H)    Diabetes history: DM1 Outpatient Diabetes medications: Lantus 12 units QHS, Humalog 8 units TID with meals Current orders for Inpatient glycemic control: Lantus 12 units QHS, Novolog 0-15 units TID with meals, Novolog 0-5 units HS, Novolog 8 units TID with meals for meal coverage  Inpatient Diabetes Program Recommendations IV fluids: Patient currently has dextrose in IVF. Please re-evaluate need for dextrose in IVF. Correction (SSI): Patient has Type 1 diabetes and therefore will likely be sensitive to insulin. Please consider decreasing Novolog correction to sensitive correction scale. Insulin - Meal Coverage: Since patient is only on clear liquid diet, may want to consider decreasing Novolog meal coverage to Novolog 4 units TID with meals and then increase if needed once diet is advanced.  Thanks, Barnie Alderman, RN, MSN, CCRN, CDE Diabetes Coordinator Inpatient Diabetes Program [phone] (Team Pager from [place] to [place]) [phone] ([facility] office) [phone] [facility] office) [phone] [facility] office)

## 2015-05-29 NOTE — Progress Notes (Signed)
Patient alert and oriented. VSS, room air,CO intermittent abdominal pain. Anuric. Transitioned off of DKA insulin drip, rested throughout shift.

## 2015-05-29 NOTE — Care Management Note (Signed)
Patient is active at [facility] on TTS schedule.  I have sent admission records to clinic and will update with with discharge records when that occurs.   [facility] Liaison  [phone]

## 2015-05-29 NOTE — Discharge Instructions (Signed)
°  DIET:  Diabetic diet  DISCHARGE CONDITION:  Stable  ACTIVITY:  Activity as tolerated  OXYGEN:  Home Oxygen: No.   Oxygen Delivery: room air  DISCHARGE LOCATION:  home   If you experience worsening of your admission symptoms, develop shortness of breath, life threatening emergency, suicidal or homicidal thoughts you must seek medical attention immediately by calling 911 or calling your MD immediately  if symptoms less severe.  You Must read complete instructions/literature along with all the possible adverse reactions/side effects for all the Medicines you take and that have been prescribed to you. Take any new Medicines after you have completely understood and accpet all the possible adverse reactions/side effects.   Please note  You were cared for by a hospitalist during your hospital stay. If you have any questions about your discharge medications or the care you received while you were in the hospital after you are discharged, you can call the unit and asked to speak with the hospitalist on call if the hospitalist that took care of you is not available. Once you are discharged, your primary care physician will handle any further medical issues. Please note that NO REFILLS for any discharge medications will be authorized once you are discharged, as it is imperative that you return to your primary care physician (or establish a relationship with a primary care physician if you do not have one) for your aftercare needs so that they can reassess your need for medications and monitor your lab values.  Continue dialysis as before.  Take 12 units lantus daily night

## 2015-05-29 NOTE — Progress Notes (Signed)
Results for TYRELLE, GUDAITIS (MRN [id number]) as of 05/29/2015 09:54  Ref. Range 05/29/2015 01:19 05/29/2015 01:38 05/29/2015 04:05 05/29/2015 07:53  Glucose-Capillary Latest Ref Range: 65-99 mg/dL 70 118 (H) 111 (H) 101 (H)  Received a page from staff RN regarding patient's blood sugar level. Patient is to receive Novolog 8 units meal coverage for CBG of 101 mg/dl. Recommend decreasing meal coverage to Novolog 4 units TID with meals. Continue Novolog correction scale, but decrease to SENSITIVE TID & HS. Harvel Ricks RN BSN CDE

## 2015-05-29 NOTE — Care Management Note (Signed)
Case Management Note  Patient Details  Name: JULE SCHLABACH MRN: [id number] Date of Birth: 02-01-1981  Subjective/Objective:   Met with patient and discussed potential discharge needs. Admitted with DKA, on chronic dialysis with [facility] [place]. Patient tells CM that he lives at home with his mother. He denies issues obtaining medical care, transportation, food, shelter or medications. He reports compliance with medications. Renal MD provides all medical care. Patient does not have home health nursing.                 Action/Plan: No needs identified.   Expected Discharge Date:  05/29/15               Expected Discharge Plan:  Home/Self Care  In-House Referral:     Discharge planning Services     Post Acute Care Choice:    Choice offered to:     DME Arranged:    DME Agency:     HH Arranged:    HH Agency:     Status of Service:  Completed, signed off  Medicare Important Message Given:  Yes-second notification given Date Medicare IM Given:    Medicare IM give by:    Date Additional Medicare IM Given:    Additional Medicare Important Message give by:     If discussed at [place] of Stay Meetings, dates discussed:    Additional Comments:  Jolly Mango, RN 05/29/2015, 11:44 AM

## 2015-05-29 NOTE — Progress Notes (Signed)
[name]  ROUNDING NOTE   Subjective:  Pt was standing up when seen this AM. Tolerating clears well thus far.  Had HD yesterday and tolerated well.   Objective:  Vital signs in last 24 hours:  Temp:  [98.4 F (36.9 C)-98.8 F (37.1 C)] 98.8 F (37.1 C) (08/31 0800) Pulse Rate:  [84-106] 97 (08/31 0900) Resp:  [0-28] 18 (08/31 0900) BP: (123-221)/(61-146) 129/61 mmHg (08/31 0900) SpO2:  [90 %-100 %] 97 % (08/31 0900)  Weight change:  Filed Weights   05/27/15 0311 05/27/15 0643  Weight: 90.719 kg (200 lb) 89 kg (196 lb 3.4 oz)    Intake/Output: I/O last 3 completed shifts: In: 2854.6 [P.O.:480; I.V.:2374.6] Out: 1500 [Other:1500]   Intake/Output this shift:  Total I/O In: 75 [I.V.:75] Out: -   Physical Exam: General: NAD, standing up.  Head: Normocephalic, atraumatic. Moist oral mucosal membranes  Eyes: Anicteric  Neck: Supple, trachea midline  Lungs:  Clear to auscultation normal effort  Heart: Regular rate and rhythm  Abdomen:  Soft, nontender, BS present  Extremities: Trace peripheral edema.  Neurologic: Nonfocal, moving all four extremities  Skin: No lesions  Access: R IJ permcath    Basic Metabolic Panel:  Recent Labs Lab 05/27/15 2326 05/28/15 0532 05/28/15 0949 05/28/15 1846 05/29/15 0034  NA 141 138 139 138 141  K 3.8 4.0 3.9 4.0 3.7  CL 100* 99* 99* 93* 99*  CO2 22 20* 21* 20* 29  GLUCOSE 185* 227* 196* 227* 82  BUN 52* 55* 56* 54* 28*  CREATININE 10.83* 11.17* 12.39* 12.53* 7.31*  CALCIUM 9.2 8.9 9.0 9.2 8.7*  PHOS  --   --  5.0* 5.3*  --     Liver Function Tests:  Recent Labs Lab 05/26/15 1003 05/27/15 0408  AST 29 21  ALT 12* 11*  ALKPHOS 90 74  BILITOT 1.4* 2.0*  PROT 8.3* 7.8  ALBUMIN 4.9 4.5    Recent Labs Lab 05/26/15 1003 05/27/15 0408  LIPASE 45 34   No results for input(s): AMMONIA in the last 168 hours.  CBC:  Recent Labs Lab 05/26/15 1003 05/27/15 0408  WBC 9.6 10.8*  NEUTROABS 8.0*   --   HGB 12.6* 11.4*  HCT 37.4* 34.7*  MCV 88.8 91.5  PLT 159 132*    Cardiac Enzymes:  Recent Labs Lab 05/27/15 0408  TROPONINI <0.03    BNP: Invalid input(s): POCBNP  CBG:  Recent Labs Lab 05/29/15 0018 05/29/15 0119 05/29/15 0138 05/29/15 0405 05/29/15 0753  GLUCAP 43 70 118* 111* 101*    Microbiology: Results for orders placed or performed during the hospital encounter of 05/27/15  MRSA PCR Screening     Status: None   Collection Time: 05/27/15  7:00 AM  Result Value Ref Range Status   MRSA by PCR NEGATIVE NEGATIVE Final    Comment:        The GeneXpert MRSA Assay (FDA approved for NASAL specimens only), is one component of a comprehensive MRSA colonization surveillance program. It is not intended to diagnose MRSA infection nor to guide or monitor treatment for MRSA infections.     Coagulation Studies: No results for input(s): LABPROT, INR in the last 72 hours.  Urinalysis:  Recent Labs  05/26/15 1115  COLORURINE YELLOW  LABSPEC 1.016  PHURINE 8.0  GLUCOSEU 500*  HGBUR MODERATE*  BILIRUBINUR NEGATIVE  KETONESUR 15*  PROTEINUR >300*  UROBILINOGEN 0.2  NITRITE NEGATIVE  LEUKOCYTESUR NEGATIVE      Imaging: Dg Abd 1  View  05/27/2015   CLINICAL DATA:  Patient with nausea, vomiting and abdominal pain for 3 days.  EXAM: ABDOMEN - 1 VIEW  COMPARISON:  CT abdomen pelvis 07/30/2014  FINDINGS: Cardiomegaly. Lung bases are clear. Paucity of small bowel gas. No definite evidence for overt obstruction. No free intraperitoneal air. Lower lumbar spine degenerative changes.  IMPRESSION: Paucity of small bowel gas limits evaluation. No evidence for overt obstruction   Electronically Signed   By: Lovey Newcomer M.D.   On: 05/27/2015 12:06     Medications:     . amLODipine  10 mg Oral Daily  . carvedilol  25 mg Oral BID WC  . cloNIDine  0.3 mg Oral BID  . insulin aspart  0-15 Units Subcutaneous TID WC  . insulin aspart  0-5 Units Subcutaneous QHS  .  insulin aspart  8 Units Subcutaneous TID AC  . insulin glargine  12 Units Subcutaneous QHS  . irbesartan  150 mg Oral Daily  . metoCLOPramide  5 mg Oral TID AC  . minoxidil  10 mg Oral Daily  . pantoprazole  40 mg Oral BID   sodium chloride, sodium chloride, alteplase, feeding supplement (NEPRO CARB STEADY), heparin, hydrALAZINE, HYDROcodone-acetaminophen, lidocaine (PF), lidocaine-prilocaine, pentafluoroprop-tetrafluoroeth, prochlorperazine  Assessment/ Plan:  34 y.o. male with ESRD on HD TTHS at [place], multiple episodes of DKA, diabetes mellitus type one, hypertension, anemia of CKD, SHPTH, peripheral neuropathy  1.  ESRD on HD TTHS:  Pt had HD yesterday, no acute indication for HD today, pt to most likely have HD tomorrow as outpt.  2.  Diabetic ketoacidosis:  Blood glucose under reasonable control now, continue to monitor per unit protocol.   3.  Anemia of CKD: would continue to monitor hgb as outpt, didn't receive epogen as inpt.  4.  SHPTH: phos 5.3 and acceptable.     5.  Hypertension:  BP continues to be quite labile, continue amlodipine, coreg, clonidiine, irbesartan, and minoxidil.   LOS: 2 Jaquae Rieves 8/31/20169:50 AM

## 2015-05-29 NOTE — Care Management Important Message (Signed)
Important Message  Patient Details  Name: BRANCE HEIDELBERG MRN: [id number] Date of Birth: 1981-07-18   Medicare Important Message Given:  Yes-second notification given    Juliann Pulse A Allmond 05/29/2015, 11:19 AM

## 2015-05-29 NOTE — Progress Notes (Signed)
Patient alert and oriented. Vitals stable. Blood sugars stable- Patient off insulin drip and on sliding scale with lantus.  Patient tolerating diet- patient discharged home- given cab voucher.  IVs and telemetry removed.  Discharge instructions given and prescriptions given to patient.  Pt discharged by wheelchair with nursing aide.

## 2015-05-30 DIAGNOSIS — D509 Iron deficiency anemia, unspecified: Secondary | ICD-10-CM | POA: Diagnosis not present

## 2015-05-30 DIAGNOSIS — Z23 Encounter for immunization: Secondary | ICD-10-CM | POA: Diagnosis not present

## 2015-05-30 DIAGNOSIS — N186 End stage renal disease: Secondary | ICD-10-CM | POA: Diagnosis not present

## 2015-05-30 DIAGNOSIS — E1129 Type 2 diabetes mellitus with other diabetic kidney complication: Secondary | ICD-10-CM | POA: Diagnosis not present

## 2015-05-30 DIAGNOSIS — D631 Anemia in chronic kidney disease: Secondary | ICD-10-CM | POA: Diagnosis not present

## 2015-05-30 DIAGNOSIS — E11329 Type 2 diabetes mellitus with mild nonproliferative diabetic retinopathy without macular edema: Secondary | ICD-10-CM | POA: Diagnosis not present

## 2015-05-30 DIAGNOSIS — N2581 Secondary hyperparathyroidism of renal origin: Secondary | ICD-10-CM | POA: Diagnosis not present

## 2015-05-30 LAB — HEPATITIS B SURFACE ANTIGEN: Hepatitis B Surface Ag: NEGATIVE

## 2015-05-30 NOTE — Discharge Summary (Addendum)
[facility] [place] NAME: [name]    MR#:  [id number]  DATE OF BIRTH:  [birth year]  DATE OF ADMISSION:  05/27/2015 ADMITTING PHYSICIAN: Harrie Foreman, MD  DATE OF DISCHARGE: 05/29/2015  3:09 PM  PRIMARY CARE PHYSICIAN: No primary care provider on file.    ADMISSION DIAGNOSIS:  Generalized abdominal pain [R10.84] Diabetic ketoacidosis without coma associated with type 1 diabetes mellitus [E10.10]  DISCHARGE DIAGNOSIS:  Active Problems:   DKA (diabetic ketoacidoses)   SECONDARY DIAGNOSIS:   Past Medical History  Diagnosis Date  . Diabetes mellitus without complication   . Hypertension   . Renal disorder     Dialysis     ADMITTING HISTORY  Chief Complaint: Feeling bad HPI: The patient presents emergency department complaining of nausea and vomiting. He states that he's been feeling bad for a few days now. He also complains of abdominal pain. Past history significant for end-stage renal disease on dialysis Tuesday, Thursday, Saturday. He was seen in the [facility] emergency department yesterday and given pain medicine and antiemetics. Her evaluation on today's visit indicates diabetic ketoacidosis for which the emergency department staff called for admission   HOSPITAL COURSE:   # 1 DKA - resolved Due to inadequate insulin. His Lantus increased to 12 units daily. He needs to follow-up with endocrinology Dr. Sharrie Rothman after discharge. Blood sugars between 120-170 at time of discharge.  #2 abdominal pain secondary to gastritis. This has improved well. On Protonix at discharge and nausea medication.  #3 uncontrolled hypertension on admission secondary to patient unable to keep his blood pressure medications down and contributed by his abdominal pain.  Pressure is much improved on restarting his home medications.  # 4.ESRD - patient was on his dialysis schedule during the hospital stay. Seen by nephrology.  Stable for  discharge to home.  CONSULTS OBTAINED:  Treatment Team:  Anthonette Legato, MD  DRUG ALLERGIES:  No Known Allergies  DISCHARGE MEDICATIONS:   Discharge Medication List as of 05/29/2015 11:38 AM    START taking these medications   Details  HYDROcodone-acetaminophen (NORCO/VICODIN) 5-325 MG per tablet Take 1 tablet by mouth every 6 (six) hours as needed for moderate pain., Starting 05/29/2015, Until Discontinued, Print    pantoprazole (PROTONIX) 40 MG tablet Take 1 tablet (40 mg total) by mouth 2 (two) times daily., Starting 05/29/2015, Until Discontinued, Print      CONTINUE these medications which have CHANGED   Details  insulin glargine (LANTUS) 100 UNIT/ML injection Inject 0.12 mLs (12 Units total) into the skin at bedtime., Starting 05/29/2015, Until Discontinued, No Print      CONTINUE these medications which have NOT CHANGED   Details  amLODipine (NORVASC) 10 MG tablet Take 10 mg by mouth daily., Until Discontinued, Historical Med    carvedilol (COREG) 25 MG tablet Take 25 mg by mouth 2 (two) times daily with a meal., Until Discontinued, Historical Med    cloNIDine (CATAPRES) 0.3 MG tablet Take 0.3 mg by mouth 2 (two) times daily., Until Discontinued, Historical Med    insulin lispro (HUMALOG) 100 UNIT/ML injection Inject 8 Units into the skin 3 (three) times daily before meals., Until Discontinued, Historical Med    metoCLOPramide (REGLAN) 5 MG tablet Take 5 mg by mouth 3 (three) times daily before meals., Until Discontinued, Historical Med    minoxidil (LONITEN) 10 MG tablet Take 10 mg by mouth daily., Until Discontinued, Historical Med    spironolactone (ALDACTONE) 25 MG tablet  Take 25 mg by mouth daily., Until Discontinued, Historical Med    valsartan (DIOVAN) 160 MG tablet Take 160 mg by mouth 2 (two) times daily., Until Discontinued, Historical Med         Today    VITAL SIGNS:  Blood pressure 129/61, pulse 97, temperature 98.8 F (37.1 C), temperature source  Oral, resp. rate 18, height 5\' 7"  (1.702 m), weight 89 kg (196 lb 3.4 oz), SpO2 97 %.  I/O:  No intake or output data in the 24 hours ending 05/30/15 1303  PHYSICAL EXAMINATION:  Physical Exam  GENERAL:  34 y.o.-year-old patient lying in the bed with no acute distress.  LUNGS: Normal breath sounds bilaterally, no wheezing, rales,rhonchi or crepitation. No use of accessory muscles of respiration.  CARDIOVASCULAR: S1, S2 normal. No murmurs, rubs, or gallops.  ABDOMEN: Soft, non-tender, non-distended. Bowel sounds present. No organomegaly or mass.  NEUROLOGIC: Moves all 4 extremities. PSYCHIATRIC: The patient is alert and oriented x 3.  SKIN: No obvious rash, lesion, or ulcer.   DATA REVIEW:   CBC  Recent Labs Lab 05/27/15 0408  WBC 10.8*  HGB 11.4*  HCT 34.7*  PLT 132*    Chemistries   Recent Labs Lab 05/27/15 0408  05/29/15 0034  NA 139  < > 141  K 4.1  < > 3.7  CL 94*  < > 99*  CO2 17*  < > 29  GLUCOSE 375*  < > 82  BUN 44*  < > 28*  CREATININE 9.48*  < > 7.31*  CALCIUM 9.5  < > 8.7*  AST 21  --   --   ALT 11*  --   --   ALKPHOS 74  --   --   BILITOT 2.0*  --   --   < > = values in this interval not displayed.  Cardiac Enzymes  Recent Labs Lab 05/27/15 0408  TROPONINI <0.03    Microbiology Results  Results for orders placed or performed during the hospital encounter of 05/27/15  MRSA PCR Screening     Status: None   Collection Time: 05/27/15  7:00 AM  Result Value Ref Range Status   MRSA by PCR NEGATIVE NEGATIVE Final    Comment:        The GeneXpert MRSA Assay (FDA approved for NASAL specimens only), is one component of a comprehensive MRSA colonization surveillance program. It is not intended to diagnose MRSA infection nor to guide or monitor treatment for MRSA infections.     RADIOLOGY:  No results found.  Follow up with PCP in 1 week.  Management plans discussed with the patient, family and they are in agreement.  CODE STATUS:    TOTAL TIME TAKING CARE OF THIS PATIENT ON DAY OF DISCHARGE: more than 30 minutes.    Hillary Bow R M.D on 05/30/2015 at 1:03 PM  Between 7am to 6pm - Pager - [phone]  After 6pm go to [email or web address] - password [email or web address] [facility]  [place] [facility]  Office  [phone]  CC: Primary care physician; No primary care provider on file.

## 2015-06-01 DIAGNOSIS — D509 Iron deficiency anemia, unspecified: Secondary | ICD-10-CM | POA: Diagnosis not present

## 2015-06-01 DIAGNOSIS — Z23 Encounter for immunization: Secondary | ICD-10-CM | POA: Diagnosis not present

## 2015-06-01 DIAGNOSIS — N2581 Secondary hyperparathyroidism of renal origin: Secondary | ICD-10-CM | POA: Diagnosis not present

## 2015-06-01 DIAGNOSIS — E1129 Type 2 diabetes mellitus with other diabetic kidney complication: Secondary | ICD-10-CM | POA: Diagnosis not present

## 2015-06-01 DIAGNOSIS — E11329 Type 2 diabetes mellitus with mild nonproliferative diabetic retinopathy without macular edema: Secondary | ICD-10-CM | POA: Diagnosis not present

## 2015-06-01 DIAGNOSIS — N186 End stage renal disease: Secondary | ICD-10-CM | POA: Diagnosis not present

## 2015-06-04 DIAGNOSIS — Z23 Encounter for immunization: Secondary | ICD-10-CM | POA: Diagnosis not present

## 2015-06-04 DIAGNOSIS — N2581 Secondary hyperparathyroidism of renal origin: Secondary | ICD-10-CM | POA: Diagnosis not present

## 2015-06-04 DIAGNOSIS — D509 Iron deficiency anemia, unspecified: Secondary | ICD-10-CM | POA: Diagnosis not present

## 2015-06-04 DIAGNOSIS — E1129 Type 2 diabetes mellitus with other diabetic kidney complication: Secondary | ICD-10-CM | POA: Diagnosis not present

## 2015-06-04 DIAGNOSIS — N186 End stage renal disease: Secondary | ICD-10-CM | POA: Diagnosis not present

## 2015-06-04 DIAGNOSIS — E11329 Type 2 diabetes mellitus with mild nonproliferative diabetic retinopathy without macular edema: Secondary | ICD-10-CM | POA: Diagnosis not present

## 2015-06-06 DIAGNOSIS — D509 Iron deficiency anemia, unspecified: Secondary | ICD-10-CM | POA: Diagnosis not present

## 2015-06-06 DIAGNOSIS — Z23 Encounter for immunization: Secondary | ICD-10-CM | POA: Diagnosis not present

## 2015-06-06 DIAGNOSIS — E1129 Type 2 diabetes mellitus with other diabetic kidney complication: Secondary | ICD-10-CM | POA: Diagnosis not present

## 2015-06-06 DIAGNOSIS — N2581 Secondary hyperparathyroidism of renal origin: Secondary | ICD-10-CM | POA: Diagnosis not present

## 2015-06-06 DIAGNOSIS — E11329 Type 2 diabetes mellitus with mild nonproliferative diabetic retinopathy without macular edema: Secondary | ICD-10-CM | POA: Diagnosis not present

## 2015-06-06 DIAGNOSIS — N186 End stage renal disease: Secondary | ICD-10-CM | POA: Diagnosis not present

## 2015-06-08 DIAGNOSIS — N2581 Secondary hyperparathyroidism of renal origin: Secondary | ICD-10-CM | POA: Diagnosis not present

## 2015-06-08 DIAGNOSIS — N186 End stage renal disease: Secondary | ICD-10-CM | POA: Diagnosis not present

## 2015-06-08 DIAGNOSIS — Z23 Encounter for immunization: Secondary | ICD-10-CM | POA: Diagnosis not present

## 2015-06-08 DIAGNOSIS — E11329 Type 2 diabetes mellitus with mild nonproliferative diabetic retinopathy without macular edema: Secondary | ICD-10-CM | POA: Diagnosis not present

## 2015-06-08 DIAGNOSIS — D509 Iron deficiency anemia, unspecified: Secondary | ICD-10-CM | POA: Diagnosis not present

## 2015-06-08 DIAGNOSIS — E1129 Type 2 diabetes mellitus with other diabetic kidney complication: Secondary | ICD-10-CM | POA: Diagnosis not present

## 2015-06-10 DIAGNOSIS — E1022 Type 1 diabetes mellitus with diabetic chronic kidney disease: Secondary | ICD-10-CM | POA: Diagnosis not present

## 2015-06-10 DIAGNOSIS — E1065 Type 1 diabetes mellitus with hyperglycemia: Secondary | ICD-10-CM | POA: Diagnosis not present

## 2015-06-10 DIAGNOSIS — I10 Essential (primary) hypertension: Secondary | ICD-10-CM | POA: Diagnosis not present

## 2015-06-10 DIAGNOSIS — E10359 Type 1 diabetes mellitus with proliferative diabetic retinopathy without macular edema: Secondary | ICD-10-CM | POA: Diagnosis not present

## 2015-06-11 DIAGNOSIS — Z23 Encounter for immunization: Secondary | ICD-10-CM | POA: Diagnosis not present

## 2015-06-11 DIAGNOSIS — E1129 Type 2 diabetes mellitus with other diabetic kidney complication: Secondary | ICD-10-CM | POA: Diagnosis not present

## 2015-06-11 DIAGNOSIS — N186 End stage renal disease: Secondary | ICD-10-CM | POA: Diagnosis not present

## 2015-06-11 DIAGNOSIS — D509 Iron deficiency anemia, unspecified: Secondary | ICD-10-CM | POA: Diagnosis not present

## 2015-06-11 DIAGNOSIS — N2581 Secondary hyperparathyroidism of renal origin: Secondary | ICD-10-CM | POA: Diagnosis not present

## 2015-06-11 DIAGNOSIS — E11329 Type 2 diabetes mellitus with mild nonproliferative diabetic retinopathy without macular edema: Secondary | ICD-10-CM | POA: Diagnosis not present

## 2015-06-13 DIAGNOSIS — D509 Iron deficiency anemia, unspecified: Secondary | ICD-10-CM | POA: Diagnosis not present

## 2015-06-13 DIAGNOSIS — N186 End stage renal disease: Secondary | ICD-10-CM | POA: Diagnosis not present

## 2015-06-13 DIAGNOSIS — E11329 Type 2 diabetes mellitus with mild nonproliferative diabetic retinopathy without macular edema: Secondary | ICD-10-CM | POA: Diagnosis not present

## 2015-06-13 DIAGNOSIS — Z23 Encounter for immunization: Secondary | ICD-10-CM | POA: Diagnosis not present

## 2015-06-13 DIAGNOSIS — E1129 Type 2 diabetes mellitus with other diabetic kidney complication: Secondary | ICD-10-CM | POA: Diagnosis not present

## 2015-06-13 DIAGNOSIS — N2581 Secondary hyperparathyroidism of renal origin: Secondary | ICD-10-CM | POA: Diagnosis not present

## 2015-06-15 DIAGNOSIS — N186 End stage renal disease: Secondary | ICD-10-CM | POA: Diagnosis not present

## 2015-06-15 DIAGNOSIS — E1129 Type 2 diabetes mellitus with other diabetic kidney complication: Secondary | ICD-10-CM | POA: Diagnosis not present

## 2015-06-15 DIAGNOSIS — D509 Iron deficiency anemia, unspecified: Secondary | ICD-10-CM | POA: Diagnosis not present

## 2015-06-15 DIAGNOSIS — E11329 Type 2 diabetes mellitus with mild nonproliferative diabetic retinopathy without macular edema: Secondary | ICD-10-CM | POA: Diagnosis not present

## 2015-06-15 DIAGNOSIS — Z23 Encounter for immunization: Secondary | ICD-10-CM | POA: Diagnosis not present

## 2015-06-15 DIAGNOSIS — N2581 Secondary hyperparathyroidism of renal origin: Secondary | ICD-10-CM | POA: Diagnosis not present

## 2015-06-18 DIAGNOSIS — E1129 Type 2 diabetes mellitus with other diabetic kidney complication: Secondary | ICD-10-CM | POA: Diagnosis not present

## 2015-06-18 DIAGNOSIS — Z23 Encounter for immunization: Secondary | ICD-10-CM | POA: Diagnosis not present

## 2015-06-18 DIAGNOSIS — N186 End stage renal disease: Secondary | ICD-10-CM | POA: Diagnosis not present

## 2015-06-18 DIAGNOSIS — E11329 Type 2 diabetes mellitus with mild nonproliferative diabetic retinopathy without macular edema: Secondary | ICD-10-CM | POA: Diagnosis not present

## 2015-06-18 DIAGNOSIS — N2581 Secondary hyperparathyroidism of renal origin: Secondary | ICD-10-CM | POA: Diagnosis not present

## 2015-06-18 DIAGNOSIS — D509 Iron deficiency anemia, unspecified: Secondary | ICD-10-CM | POA: Diagnosis not present

## 2015-06-20 DIAGNOSIS — E1129 Type 2 diabetes mellitus with other diabetic kidney complication: Secondary | ICD-10-CM | POA: Diagnosis not present

## 2015-06-20 DIAGNOSIS — D509 Iron deficiency anemia, unspecified: Secondary | ICD-10-CM | POA: Diagnosis not present

## 2015-06-20 DIAGNOSIS — N186 End stage renal disease: Secondary | ICD-10-CM | POA: Diagnosis not present

## 2015-06-20 DIAGNOSIS — Z23 Encounter for immunization: Secondary | ICD-10-CM | POA: Diagnosis not present

## 2015-06-20 DIAGNOSIS — N2581 Secondary hyperparathyroidism of renal origin: Secondary | ICD-10-CM | POA: Diagnosis not present

## 2015-06-20 DIAGNOSIS — E11329 Type 2 diabetes mellitus with mild nonproliferative diabetic retinopathy without macular edema: Secondary | ICD-10-CM | POA: Diagnosis not present

## 2015-06-22 DIAGNOSIS — E11329 Type 2 diabetes mellitus with mild nonproliferative diabetic retinopathy without macular edema: Secondary | ICD-10-CM | POA: Diagnosis not present

## 2015-06-22 DIAGNOSIS — N186 End stage renal disease: Secondary | ICD-10-CM | POA: Diagnosis not present

## 2015-06-22 DIAGNOSIS — N2581 Secondary hyperparathyroidism of renal origin: Secondary | ICD-10-CM | POA: Diagnosis not present

## 2015-06-22 DIAGNOSIS — Z23 Encounter for immunization: Secondary | ICD-10-CM | POA: Diagnosis not present

## 2015-06-22 DIAGNOSIS — E1129 Type 2 diabetes mellitus with other diabetic kidney complication: Secondary | ICD-10-CM | POA: Diagnosis not present

## 2015-06-22 DIAGNOSIS — D509 Iron deficiency anemia, unspecified: Secondary | ICD-10-CM | POA: Diagnosis not present

## 2015-06-25 DIAGNOSIS — D509 Iron deficiency anemia, unspecified: Secondary | ICD-10-CM | POA: Diagnosis not present

## 2015-06-25 DIAGNOSIS — E1129 Type 2 diabetes mellitus with other diabetic kidney complication: Secondary | ICD-10-CM | POA: Diagnosis not present

## 2015-06-25 DIAGNOSIS — N2581 Secondary hyperparathyroidism of renal origin: Secondary | ICD-10-CM | POA: Diagnosis not present

## 2015-06-25 DIAGNOSIS — E11329 Type 2 diabetes mellitus with mild nonproliferative diabetic retinopathy without macular edema: Secondary | ICD-10-CM | POA: Diagnosis not present

## 2015-06-25 DIAGNOSIS — N186 End stage renal disease: Secondary | ICD-10-CM | POA: Diagnosis not present

## 2015-06-25 DIAGNOSIS — Z23 Encounter for immunization: Secondary | ICD-10-CM | POA: Diagnosis not present

## 2015-06-27 DIAGNOSIS — N2581 Secondary hyperparathyroidism of renal origin: Secondary | ICD-10-CM | POA: Diagnosis not present

## 2015-06-27 DIAGNOSIS — E1129 Type 2 diabetes mellitus with other diabetic kidney complication: Secondary | ICD-10-CM | POA: Diagnosis not present

## 2015-06-27 DIAGNOSIS — Z23 Encounter for immunization: Secondary | ICD-10-CM | POA: Diagnosis not present

## 2015-06-27 DIAGNOSIS — N186 End stage renal disease: Secondary | ICD-10-CM | POA: Diagnosis not present

## 2015-06-27 DIAGNOSIS — E11329 Type 2 diabetes mellitus with mild nonproliferative diabetic retinopathy without macular edema: Secondary | ICD-10-CM | POA: Diagnosis not present

## 2015-06-27 DIAGNOSIS — D509 Iron deficiency anemia, unspecified: Secondary | ICD-10-CM | POA: Diagnosis not present

## 2015-06-28 DIAGNOSIS — N186 End stage renal disease: Secondary | ICD-10-CM | POA: Diagnosis not present

## 2015-06-28 DIAGNOSIS — I120 Hypertensive chronic kidney disease with stage 5 chronic kidney disease or end stage renal disease: Secondary | ICD-10-CM | POA: Diagnosis not present

## 2015-06-28 DIAGNOSIS — Z992 Dependence on renal dialysis: Secondary | ICD-10-CM | POA: Diagnosis not present

## 2015-06-29 DIAGNOSIS — D631 Anemia in chronic kidney disease: Secondary | ICD-10-CM | POA: Diagnosis not present

## 2015-06-29 DIAGNOSIS — D509 Iron deficiency anemia, unspecified: Secondary | ICD-10-CM | POA: Diagnosis not present

## 2015-06-29 DIAGNOSIS — E113293 Type 2 diabetes mellitus with mild nonproliferative diabetic retinopathy without macular edema, bilateral: Secondary | ICD-10-CM | POA: Diagnosis not present

## 2015-06-29 DIAGNOSIS — E1129 Type 2 diabetes mellitus with other diabetic kidney complication: Secondary | ICD-10-CM | POA: Diagnosis not present

## 2015-06-29 DIAGNOSIS — N186 End stage renal disease: Secondary | ICD-10-CM | POA: Diagnosis not present

## 2015-06-29 DIAGNOSIS — N2581 Secondary hyperparathyroidism of renal origin: Secondary | ICD-10-CM | POA: Diagnosis not present

## 2015-07-02 DIAGNOSIS — E113293 Type 2 diabetes mellitus with mild nonproliferative diabetic retinopathy without macular edema, bilateral: Secondary | ICD-10-CM | POA: Diagnosis not present

## 2015-07-02 DIAGNOSIS — E1129 Type 2 diabetes mellitus with other diabetic kidney complication: Secondary | ICD-10-CM | POA: Diagnosis not present

## 2015-07-02 DIAGNOSIS — D631 Anemia in chronic kidney disease: Secondary | ICD-10-CM | POA: Diagnosis not present

## 2015-07-02 DIAGNOSIS — N186 End stage renal disease: Secondary | ICD-10-CM | POA: Diagnosis not present

## 2015-07-02 DIAGNOSIS — D509 Iron deficiency anemia, unspecified: Secondary | ICD-10-CM | POA: Diagnosis not present

## 2015-07-02 DIAGNOSIS — N2581 Secondary hyperparathyroidism of renal origin: Secondary | ICD-10-CM | POA: Diagnosis not present

## 2015-07-04 DIAGNOSIS — D631 Anemia in chronic kidney disease: Secondary | ICD-10-CM | POA: Diagnosis not present

## 2015-07-04 DIAGNOSIS — E1129 Type 2 diabetes mellitus with other diabetic kidney complication: Secondary | ICD-10-CM | POA: Diagnosis not present

## 2015-07-04 DIAGNOSIS — E113293 Type 2 diabetes mellitus with mild nonproliferative diabetic retinopathy without macular edema, bilateral: Secondary | ICD-10-CM | POA: Diagnosis not present

## 2015-07-04 DIAGNOSIS — N186 End stage renal disease: Secondary | ICD-10-CM | POA: Diagnosis not present

## 2015-07-04 DIAGNOSIS — N2581 Secondary hyperparathyroidism of renal origin: Secondary | ICD-10-CM | POA: Diagnosis not present

## 2015-07-04 DIAGNOSIS — D509 Iron deficiency anemia, unspecified: Secondary | ICD-10-CM | POA: Diagnosis not present

## 2015-07-06 DIAGNOSIS — N2581 Secondary hyperparathyroidism of renal origin: Secondary | ICD-10-CM | POA: Diagnosis not present

## 2015-07-06 DIAGNOSIS — D509 Iron deficiency anemia, unspecified: Secondary | ICD-10-CM | POA: Diagnosis not present

## 2015-07-06 DIAGNOSIS — D631 Anemia in chronic kidney disease: Secondary | ICD-10-CM | POA: Diagnosis not present

## 2015-07-06 DIAGNOSIS — N186 End stage renal disease: Secondary | ICD-10-CM | POA: Diagnosis not present

## 2015-07-06 DIAGNOSIS — E113293 Type 2 diabetes mellitus with mild nonproliferative diabetic retinopathy without macular edema, bilateral: Secondary | ICD-10-CM | POA: Diagnosis not present

## 2015-07-06 DIAGNOSIS — E1129 Type 2 diabetes mellitus with other diabetic kidney complication: Secondary | ICD-10-CM | POA: Diagnosis not present

## 2015-07-09 DIAGNOSIS — E113293 Type 2 diabetes mellitus with mild nonproliferative diabetic retinopathy without macular edema, bilateral: Secondary | ICD-10-CM | POA: Diagnosis not present

## 2015-07-09 DIAGNOSIS — E1129 Type 2 diabetes mellitus with other diabetic kidney complication: Secondary | ICD-10-CM | POA: Diagnosis not present

## 2015-07-09 DIAGNOSIS — D509 Iron deficiency anemia, unspecified: Secondary | ICD-10-CM | POA: Diagnosis not present

## 2015-07-09 DIAGNOSIS — D631 Anemia in chronic kidney disease: Secondary | ICD-10-CM | POA: Diagnosis not present

## 2015-07-09 DIAGNOSIS — N186 End stage renal disease: Secondary | ICD-10-CM | POA: Diagnosis not present

## 2015-07-09 DIAGNOSIS — N2581 Secondary hyperparathyroidism of renal origin: Secondary | ICD-10-CM | POA: Diagnosis not present

## 2015-07-11 DIAGNOSIS — E1129 Type 2 diabetes mellitus with other diabetic kidney complication: Secondary | ICD-10-CM | POA: Diagnosis not present

## 2015-07-11 DIAGNOSIS — D631 Anemia in chronic kidney disease: Secondary | ICD-10-CM | POA: Diagnosis not present

## 2015-07-11 DIAGNOSIS — D509 Iron deficiency anemia, unspecified: Secondary | ICD-10-CM | POA: Diagnosis not present

## 2015-07-11 DIAGNOSIS — N2581 Secondary hyperparathyroidism of renal origin: Secondary | ICD-10-CM | POA: Diagnosis not present

## 2015-07-11 DIAGNOSIS — N186 End stage renal disease: Secondary | ICD-10-CM | POA: Diagnosis not present

## 2015-07-11 DIAGNOSIS — E113293 Type 2 diabetes mellitus with mild nonproliferative diabetic retinopathy without macular edema, bilateral: Secondary | ICD-10-CM | POA: Diagnosis not present

## 2015-07-13 DIAGNOSIS — N2581 Secondary hyperparathyroidism of renal origin: Secondary | ICD-10-CM | POA: Diagnosis not present

## 2015-07-13 DIAGNOSIS — E113293 Type 2 diabetes mellitus with mild nonproliferative diabetic retinopathy without macular edema, bilateral: Secondary | ICD-10-CM | POA: Diagnosis not present

## 2015-07-13 DIAGNOSIS — D509 Iron deficiency anemia, unspecified: Secondary | ICD-10-CM | POA: Diagnosis not present

## 2015-07-13 DIAGNOSIS — D631 Anemia in chronic kidney disease: Secondary | ICD-10-CM | POA: Diagnosis not present

## 2015-07-13 DIAGNOSIS — E1129 Type 2 diabetes mellitus with other diabetic kidney complication: Secondary | ICD-10-CM | POA: Diagnosis not present

## 2015-07-13 DIAGNOSIS — N186 End stage renal disease: Secondary | ICD-10-CM | POA: Diagnosis not present

## 2015-07-16 DIAGNOSIS — D509 Iron deficiency anemia, unspecified: Secondary | ICD-10-CM | POA: Diagnosis not present

## 2015-07-16 DIAGNOSIS — N186 End stage renal disease: Secondary | ICD-10-CM | POA: Diagnosis not present

## 2015-07-16 DIAGNOSIS — E1129 Type 2 diabetes mellitus with other diabetic kidney complication: Secondary | ICD-10-CM | POA: Diagnosis not present

## 2015-07-16 DIAGNOSIS — E113293 Type 2 diabetes mellitus with mild nonproliferative diabetic retinopathy without macular edema, bilateral: Secondary | ICD-10-CM | POA: Diagnosis not present

## 2015-07-16 DIAGNOSIS — D631 Anemia in chronic kidney disease: Secondary | ICD-10-CM | POA: Diagnosis not present

## 2015-07-16 DIAGNOSIS — N2581 Secondary hyperparathyroidism of renal origin: Secondary | ICD-10-CM | POA: Diagnosis not present

## 2015-07-18 DIAGNOSIS — E1129 Type 2 diabetes mellitus with other diabetic kidney complication: Secondary | ICD-10-CM | POA: Diagnosis not present

## 2015-07-18 DIAGNOSIS — D631 Anemia in chronic kidney disease: Secondary | ICD-10-CM | POA: Diagnosis not present

## 2015-07-18 DIAGNOSIS — N2581 Secondary hyperparathyroidism of renal origin: Secondary | ICD-10-CM | POA: Diagnosis not present

## 2015-07-18 DIAGNOSIS — E113293 Type 2 diabetes mellitus with mild nonproliferative diabetic retinopathy without macular edema, bilateral: Secondary | ICD-10-CM | POA: Diagnosis not present

## 2015-07-18 DIAGNOSIS — N186 End stage renal disease: Secondary | ICD-10-CM | POA: Diagnosis not present

## 2015-07-18 DIAGNOSIS — D509 Iron deficiency anemia, unspecified: Secondary | ICD-10-CM | POA: Diagnosis not present

## 2015-07-20 DIAGNOSIS — D509 Iron deficiency anemia, unspecified: Secondary | ICD-10-CM | POA: Diagnosis not present

## 2015-07-20 DIAGNOSIS — E113293 Type 2 diabetes mellitus with mild nonproliferative diabetic retinopathy without macular edema, bilateral: Secondary | ICD-10-CM | POA: Diagnosis not present

## 2015-07-20 DIAGNOSIS — N2581 Secondary hyperparathyroidism of renal origin: Secondary | ICD-10-CM | POA: Diagnosis not present

## 2015-07-20 DIAGNOSIS — D631 Anemia in chronic kidney disease: Secondary | ICD-10-CM | POA: Diagnosis not present

## 2015-07-20 DIAGNOSIS — E1129 Type 2 diabetes mellitus with other diabetic kidney complication: Secondary | ICD-10-CM | POA: Diagnosis not present

## 2015-07-20 DIAGNOSIS — N186 End stage renal disease: Secondary | ICD-10-CM | POA: Diagnosis not present

## 2015-07-23 DIAGNOSIS — E113293 Type 2 diabetes mellitus with mild nonproliferative diabetic retinopathy without macular edema, bilateral: Secondary | ICD-10-CM | POA: Diagnosis not present

## 2015-07-23 DIAGNOSIS — D509 Iron deficiency anemia, unspecified: Secondary | ICD-10-CM | POA: Diagnosis not present

## 2015-07-23 DIAGNOSIS — N186 End stage renal disease: Secondary | ICD-10-CM | POA: Diagnosis not present

## 2015-07-23 DIAGNOSIS — E1129 Type 2 diabetes mellitus with other diabetic kidney complication: Secondary | ICD-10-CM | POA: Diagnosis not present

## 2015-07-23 DIAGNOSIS — N2581 Secondary hyperparathyroidism of renal origin: Secondary | ICD-10-CM | POA: Diagnosis not present

## 2015-07-23 DIAGNOSIS — D631 Anemia in chronic kidney disease: Secondary | ICD-10-CM | POA: Diagnosis not present

## 2015-07-25 DIAGNOSIS — E1129 Type 2 diabetes mellitus with other diabetic kidney complication: Secondary | ICD-10-CM | POA: Diagnosis not present

## 2015-07-25 DIAGNOSIS — E113293 Type 2 diabetes mellitus with mild nonproliferative diabetic retinopathy without macular edema, bilateral: Secondary | ICD-10-CM | POA: Diagnosis not present

## 2015-07-25 DIAGNOSIS — D631 Anemia in chronic kidney disease: Secondary | ICD-10-CM | POA: Diagnosis not present

## 2015-07-25 DIAGNOSIS — N186 End stage renal disease: Secondary | ICD-10-CM | POA: Diagnosis not present

## 2015-07-25 DIAGNOSIS — D509 Iron deficiency anemia, unspecified: Secondary | ICD-10-CM | POA: Diagnosis not present

## 2015-07-25 DIAGNOSIS — N2581 Secondary hyperparathyroidism of renal origin: Secondary | ICD-10-CM | POA: Diagnosis not present

## 2015-07-27 DIAGNOSIS — E113293 Type 2 diabetes mellitus with mild nonproliferative diabetic retinopathy without macular edema, bilateral: Secondary | ICD-10-CM | POA: Diagnosis not present

## 2015-07-27 DIAGNOSIS — E1129 Type 2 diabetes mellitus with other diabetic kidney complication: Secondary | ICD-10-CM | POA: Diagnosis not present

## 2015-07-27 DIAGNOSIS — D509 Iron deficiency anemia, unspecified: Secondary | ICD-10-CM | POA: Diagnosis not present

## 2015-07-27 DIAGNOSIS — D631 Anemia in chronic kidney disease: Secondary | ICD-10-CM | POA: Diagnosis not present

## 2015-07-27 DIAGNOSIS — N186 End stage renal disease: Secondary | ICD-10-CM | POA: Diagnosis not present

## 2015-07-27 DIAGNOSIS — N2581 Secondary hyperparathyroidism of renal origin: Secondary | ICD-10-CM | POA: Diagnosis not present

## 2015-07-29 DIAGNOSIS — Z992 Dependence on renal dialysis: Secondary | ICD-10-CM | POA: Diagnosis not present

## 2015-07-29 DIAGNOSIS — I120 Hypertensive chronic kidney disease with stage 5 chronic kidney disease or end stage renal disease: Secondary | ICD-10-CM | POA: Diagnosis not present

## 2015-07-29 DIAGNOSIS — N186 End stage renal disease: Secondary | ICD-10-CM | POA: Diagnosis not present

## 2015-07-30 DIAGNOSIS — E1129 Type 2 diabetes mellitus with other diabetic kidney complication: Secondary | ICD-10-CM | POA: Diagnosis not present

## 2015-07-30 DIAGNOSIS — D509 Iron deficiency anemia, unspecified: Secondary | ICD-10-CM | POA: Diagnosis not present

## 2015-07-30 DIAGNOSIS — D631 Anemia in chronic kidney disease: Secondary | ICD-10-CM | POA: Diagnosis not present

## 2015-07-30 DIAGNOSIS — E113299 Type 2 diabetes mellitus with mild nonproliferative diabetic retinopathy without macular edema, unspecified eye: Secondary | ICD-10-CM | POA: Diagnosis not present

## 2015-07-30 DIAGNOSIS — N2581 Secondary hyperparathyroidism of renal origin: Secondary | ICD-10-CM | POA: Diagnosis not present

## 2015-07-30 DIAGNOSIS — N186 End stage renal disease: Secondary | ICD-10-CM | POA: Diagnosis not present

## 2015-08-01 DIAGNOSIS — N186 End stage renal disease: Secondary | ICD-10-CM | POA: Diagnosis not present

## 2015-08-01 DIAGNOSIS — E113299 Type 2 diabetes mellitus with mild nonproliferative diabetic retinopathy without macular edema, unspecified eye: Secondary | ICD-10-CM | POA: Diagnosis not present

## 2015-08-01 DIAGNOSIS — N2581 Secondary hyperparathyroidism of renal origin: Secondary | ICD-10-CM | POA: Diagnosis not present

## 2015-08-01 DIAGNOSIS — E1129 Type 2 diabetes mellitus with other diabetic kidney complication: Secondary | ICD-10-CM | POA: Diagnosis not present

## 2015-08-01 DIAGNOSIS — D631 Anemia in chronic kidney disease: Secondary | ICD-10-CM | POA: Diagnosis not present

## 2015-08-01 DIAGNOSIS — D509 Iron deficiency anemia, unspecified: Secondary | ICD-10-CM | POA: Diagnosis not present

## 2015-08-03 DIAGNOSIS — E113299 Type 2 diabetes mellitus with mild nonproliferative diabetic retinopathy without macular edema, unspecified eye: Secondary | ICD-10-CM | POA: Diagnosis not present

## 2015-08-03 DIAGNOSIS — N2581 Secondary hyperparathyroidism of renal origin: Secondary | ICD-10-CM | POA: Diagnosis not present

## 2015-08-03 DIAGNOSIS — N186 End stage renal disease: Secondary | ICD-10-CM | POA: Diagnosis not present

## 2015-08-03 DIAGNOSIS — E1129 Type 2 diabetes mellitus with other diabetic kidney complication: Secondary | ICD-10-CM | POA: Diagnosis not present

## 2015-08-03 DIAGNOSIS — D631 Anemia in chronic kidney disease: Secondary | ICD-10-CM | POA: Diagnosis not present

## 2015-08-03 DIAGNOSIS — D509 Iron deficiency anemia, unspecified: Secondary | ICD-10-CM | POA: Diagnosis not present

## 2015-08-06 DIAGNOSIS — E1129 Type 2 diabetes mellitus with other diabetic kidney complication: Secondary | ICD-10-CM | POA: Diagnosis not present

## 2015-08-06 DIAGNOSIS — N186 End stage renal disease: Secondary | ICD-10-CM | POA: Diagnosis not present

## 2015-08-06 DIAGNOSIS — D631 Anemia in chronic kidney disease: Secondary | ICD-10-CM | POA: Diagnosis not present

## 2015-08-06 DIAGNOSIS — N2581 Secondary hyperparathyroidism of renal origin: Secondary | ICD-10-CM | POA: Diagnosis not present

## 2015-08-06 DIAGNOSIS — D509 Iron deficiency anemia, unspecified: Secondary | ICD-10-CM | POA: Diagnosis not present

## 2015-08-06 DIAGNOSIS — E113299 Type 2 diabetes mellitus with mild nonproliferative diabetic retinopathy without macular edema, unspecified eye: Secondary | ICD-10-CM | POA: Diagnosis not present

## 2015-08-08 DIAGNOSIS — E1129 Type 2 diabetes mellitus with other diabetic kidney complication: Secondary | ICD-10-CM | POA: Diagnosis not present

## 2015-08-08 DIAGNOSIS — E113299 Type 2 diabetes mellitus with mild nonproliferative diabetic retinopathy without macular edema, unspecified eye: Secondary | ICD-10-CM | POA: Diagnosis not present

## 2015-08-08 DIAGNOSIS — D631 Anemia in chronic kidney disease: Secondary | ICD-10-CM | POA: Diagnosis not present

## 2015-08-08 DIAGNOSIS — N186 End stage renal disease: Secondary | ICD-10-CM | POA: Diagnosis not present

## 2015-08-08 DIAGNOSIS — D509 Iron deficiency anemia, unspecified: Secondary | ICD-10-CM | POA: Diagnosis not present

## 2015-08-08 DIAGNOSIS — N2581 Secondary hyperparathyroidism of renal origin: Secondary | ICD-10-CM | POA: Diagnosis not present

## 2015-08-10 DIAGNOSIS — N186 End stage renal disease: Secondary | ICD-10-CM | POA: Diagnosis not present

## 2015-08-10 DIAGNOSIS — E1129 Type 2 diabetes mellitus with other diabetic kidney complication: Secondary | ICD-10-CM | POA: Diagnosis not present

## 2015-08-10 DIAGNOSIS — E113299 Type 2 diabetes mellitus with mild nonproliferative diabetic retinopathy without macular edema, unspecified eye: Secondary | ICD-10-CM | POA: Diagnosis not present

## 2015-08-10 DIAGNOSIS — D631 Anemia in chronic kidney disease: Secondary | ICD-10-CM | POA: Diagnosis not present

## 2015-08-10 DIAGNOSIS — D509 Iron deficiency anemia, unspecified: Secondary | ICD-10-CM | POA: Diagnosis not present

## 2015-08-10 DIAGNOSIS — N2581 Secondary hyperparathyroidism of renal origin: Secondary | ICD-10-CM | POA: Diagnosis not present

## 2015-08-13 DIAGNOSIS — E1129 Type 2 diabetes mellitus with other diabetic kidney complication: Secondary | ICD-10-CM | POA: Diagnosis not present

## 2015-08-13 DIAGNOSIS — E113299 Type 2 diabetes mellitus with mild nonproliferative diabetic retinopathy without macular edema, unspecified eye: Secondary | ICD-10-CM | POA: Diagnosis not present

## 2015-08-13 DIAGNOSIS — D509 Iron deficiency anemia, unspecified: Secondary | ICD-10-CM | POA: Diagnosis not present

## 2015-08-13 DIAGNOSIS — D631 Anemia in chronic kidney disease: Secondary | ICD-10-CM | POA: Diagnosis not present

## 2015-08-13 DIAGNOSIS — N2581 Secondary hyperparathyroidism of renal origin: Secondary | ICD-10-CM | POA: Diagnosis not present

## 2015-08-13 DIAGNOSIS — N186 End stage renal disease: Secondary | ICD-10-CM | POA: Diagnosis not present

## 2015-08-15 DIAGNOSIS — E113299 Type 2 diabetes mellitus with mild nonproliferative diabetic retinopathy without macular edema, unspecified eye: Secondary | ICD-10-CM | POA: Diagnosis not present

## 2015-08-15 DIAGNOSIS — E1129 Type 2 diabetes mellitus with other diabetic kidney complication: Secondary | ICD-10-CM | POA: Diagnosis not present

## 2015-08-15 DIAGNOSIS — N186 End stage renal disease: Secondary | ICD-10-CM | POA: Diagnosis not present

## 2015-08-15 DIAGNOSIS — D631 Anemia in chronic kidney disease: Secondary | ICD-10-CM | POA: Diagnosis not present

## 2015-08-15 DIAGNOSIS — N2581 Secondary hyperparathyroidism of renal origin: Secondary | ICD-10-CM | POA: Diagnosis not present

## 2015-08-15 DIAGNOSIS — D509 Iron deficiency anemia, unspecified: Secondary | ICD-10-CM | POA: Diagnosis not present

## 2015-08-17 DIAGNOSIS — N2581 Secondary hyperparathyroidism of renal origin: Secondary | ICD-10-CM | POA: Diagnosis not present

## 2015-08-17 DIAGNOSIS — E113299 Type 2 diabetes mellitus with mild nonproliferative diabetic retinopathy without macular edema, unspecified eye: Secondary | ICD-10-CM | POA: Diagnosis not present

## 2015-08-17 DIAGNOSIS — E1129 Type 2 diabetes mellitus with other diabetic kidney complication: Secondary | ICD-10-CM | POA: Diagnosis not present

## 2015-08-17 DIAGNOSIS — N186 End stage renal disease: Secondary | ICD-10-CM | POA: Diagnosis not present

## 2015-08-17 DIAGNOSIS — D631 Anemia in chronic kidney disease: Secondary | ICD-10-CM | POA: Diagnosis not present

## 2015-08-17 DIAGNOSIS — D509 Iron deficiency anemia, unspecified: Secondary | ICD-10-CM | POA: Diagnosis not present

## 2015-08-20 DIAGNOSIS — E113299 Type 2 diabetes mellitus with mild nonproliferative diabetic retinopathy without macular edema, unspecified eye: Secondary | ICD-10-CM | POA: Diagnosis not present

## 2015-08-20 DIAGNOSIS — D509 Iron deficiency anemia, unspecified: Secondary | ICD-10-CM | POA: Diagnosis not present

## 2015-08-20 DIAGNOSIS — N2581 Secondary hyperparathyroidism of renal origin: Secondary | ICD-10-CM | POA: Diagnosis not present

## 2015-08-20 DIAGNOSIS — E1129 Type 2 diabetes mellitus with other diabetic kidney complication: Secondary | ICD-10-CM | POA: Diagnosis not present

## 2015-08-20 DIAGNOSIS — D631 Anemia in chronic kidney disease: Secondary | ICD-10-CM | POA: Diagnosis not present

## 2015-08-20 DIAGNOSIS — N186 End stage renal disease: Secondary | ICD-10-CM | POA: Diagnosis not present

## 2015-08-22 DIAGNOSIS — E113299 Type 2 diabetes mellitus with mild nonproliferative diabetic retinopathy without macular edema, unspecified eye: Secondary | ICD-10-CM | POA: Diagnosis not present

## 2015-08-22 DIAGNOSIS — N186 End stage renal disease: Secondary | ICD-10-CM | POA: Diagnosis not present

## 2015-08-22 DIAGNOSIS — D509 Iron deficiency anemia, unspecified: Secondary | ICD-10-CM | POA: Diagnosis not present

## 2015-08-22 DIAGNOSIS — E1129 Type 2 diabetes mellitus with other diabetic kidney complication: Secondary | ICD-10-CM | POA: Diagnosis not present

## 2015-08-22 DIAGNOSIS — D631 Anemia in chronic kidney disease: Secondary | ICD-10-CM | POA: Diagnosis not present

## 2015-08-22 DIAGNOSIS — N2581 Secondary hyperparathyroidism of renal origin: Secondary | ICD-10-CM | POA: Diagnosis not present

## 2015-08-24 DIAGNOSIS — E113299 Type 2 diabetes mellitus with mild nonproliferative diabetic retinopathy without macular edema, unspecified eye: Secondary | ICD-10-CM | POA: Diagnosis not present

## 2015-08-24 DIAGNOSIS — D631 Anemia in chronic kidney disease: Secondary | ICD-10-CM | POA: Diagnosis not present

## 2015-08-24 DIAGNOSIS — N186 End stage renal disease: Secondary | ICD-10-CM | POA: Diagnosis not present

## 2015-08-24 DIAGNOSIS — E1129 Type 2 diabetes mellitus with other diabetic kidney complication: Secondary | ICD-10-CM | POA: Diagnosis not present

## 2015-08-24 DIAGNOSIS — D509 Iron deficiency anemia, unspecified: Secondary | ICD-10-CM | POA: Diagnosis not present

## 2015-08-24 DIAGNOSIS — N2581 Secondary hyperparathyroidism of renal origin: Secondary | ICD-10-CM | POA: Diagnosis not present

## 2015-08-27 DIAGNOSIS — N2581 Secondary hyperparathyroidism of renal origin: Secondary | ICD-10-CM | POA: Diagnosis not present

## 2015-08-27 DIAGNOSIS — E113299 Type 2 diabetes mellitus with mild nonproliferative diabetic retinopathy without macular edema, unspecified eye: Secondary | ICD-10-CM | POA: Diagnosis not present

## 2015-08-27 DIAGNOSIS — D631 Anemia in chronic kidney disease: Secondary | ICD-10-CM | POA: Diagnosis not present

## 2015-08-27 DIAGNOSIS — E1129 Type 2 diabetes mellitus with other diabetic kidney complication: Secondary | ICD-10-CM | POA: Diagnosis not present

## 2015-08-27 DIAGNOSIS — N186 End stage renal disease: Secondary | ICD-10-CM | POA: Diagnosis not present

## 2015-08-27 DIAGNOSIS — D509 Iron deficiency anemia, unspecified: Secondary | ICD-10-CM | POA: Diagnosis not present

## 2015-08-28 DIAGNOSIS — Z992 Dependence on renal dialysis: Secondary | ICD-10-CM | POA: Diagnosis not present

## 2015-08-28 DIAGNOSIS — I120 Hypertensive chronic kidney disease with stage 5 chronic kidney disease or end stage renal disease: Secondary | ICD-10-CM | POA: Diagnosis not present

## 2015-08-28 DIAGNOSIS — N186 End stage renal disease: Secondary | ICD-10-CM | POA: Diagnosis not present

## 2015-08-29 DIAGNOSIS — E113299 Type 2 diabetes mellitus with mild nonproliferative diabetic retinopathy without macular edema, unspecified eye: Secondary | ICD-10-CM | POA: Diagnosis not present

## 2015-08-29 DIAGNOSIS — N2581 Secondary hyperparathyroidism of renal origin: Secondary | ICD-10-CM | POA: Diagnosis not present

## 2015-08-29 DIAGNOSIS — D509 Iron deficiency anemia, unspecified: Secondary | ICD-10-CM | POA: Diagnosis not present

## 2015-08-29 DIAGNOSIS — N186 End stage renal disease: Secondary | ICD-10-CM | POA: Diagnosis not present

## 2015-08-29 DIAGNOSIS — D631 Anemia in chronic kidney disease: Secondary | ICD-10-CM | POA: Diagnosis not present

## 2015-08-31 DIAGNOSIS — N186 End stage renal disease: Secondary | ICD-10-CM | POA: Diagnosis not present

## 2015-08-31 DIAGNOSIS — D631 Anemia in chronic kidney disease: Secondary | ICD-10-CM | POA: Diagnosis not present

## 2015-08-31 DIAGNOSIS — N2581 Secondary hyperparathyroidism of renal origin: Secondary | ICD-10-CM | POA: Diagnosis not present

## 2015-08-31 DIAGNOSIS — D509 Iron deficiency anemia, unspecified: Secondary | ICD-10-CM | POA: Diagnosis not present

## 2015-08-31 DIAGNOSIS — E113299 Type 2 diabetes mellitus with mild nonproliferative diabetic retinopathy without macular edema, unspecified eye: Secondary | ICD-10-CM | POA: Diagnosis not present

## 2015-09-03 DIAGNOSIS — E113299 Type 2 diabetes mellitus with mild nonproliferative diabetic retinopathy without macular edema, unspecified eye: Secondary | ICD-10-CM | POA: Diagnosis not present

## 2015-09-03 DIAGNOSIS — D631 Anemia in chronic kidney disease: Secondary | ICD-10-CM | POA: Diagnosis not present

## 2015-09-03 DIAGNOSIS — N186 End stage renal disease: Secondary | ICD-10-CM | POA: Diagnosis not present

## 2015-09-03 DIAGNOSIS — D509 Iron deficiency anemia, unspecified: Secondary | ICD-10-CM | POA: Diagnosis not present

## 2015-09-03 DIAGNOSIS — N2581 Secondary hyperparathyroidism of renal origin: Secondary | ICD-10-CM | POA: Diagnosis not present

## 2015-09-05 DIAGNOSIS — D631 Anemia in chronic kidney disease: Secondary | ICD-10-CM | POA: Diagnosis not present

## 2015-09-05 DIAGNOSIS — N186 End stage renal disease: Secondary | ICD-10-CM | POA: Diagnosis not present

## 2015-09-05 DIAGNOSIS — E113299 Type 2 diabetes mellitus with mild nonproliferative diabetic retinopathy without macular edema, unspecified eye: Secondary | ICD-10-CM | POA: Diagnosis not present

## 2015-09-05 DIAGNOSIS — N2581 Secondary hyperparathyroidism of renal origin: Secondary | ICD-10-CM | POA: Diagnosis not present

## 2015-09-05 DIAGNOSIS — D509 Iron deficiency anemia, unspecified: Secondary | ICD-10-CM | POA: Diagnosis not present

## 2015-09-07 DIAGNOSIS — E113299 Type 2 diabetes mellitus with mild nonproliferative diabetic retinopathy without macular edema, unspecified eye: Secondary | ICD-10-CM | POA: Diagnosis not present

## 2015-09-07 DIAGNOSIS — D509 Iron deficiency anemia, unspecified: Secondary | ICD-10-CM | POA: Diagnosis not present

## 2015-09-07 DIAGNOSIS — D631 Anemia in chronic kidney disease: Secondary | ICD-10-CM | POA: Diagnosis not present

## 2015-09-07 DIAGNOSIS — N2581 Secondary hyperparathyroidism of renal origin: Secondary | ICD-10-CM | POA: Diagnosis not present

## 2015-09-07 DIAGNOSIS — N186 End stage renal disease: Secondary | ICD-10-CM | POA: Diagnosis not present

## 2015-09-10 ENCOUNTER — Other Ambulatory Visit
Admission: RE | Admit: 2015-09-10 | Discharge: 2015-09-10 | Disposition: A | Discharge status: Home / Self Care | Payer: Medicare Other | Source: Ambulatory Visit | Attending: Nephrology | Admitting: Nephrology

## 2015-09-10 DIAGNOSIS — E113299 Type 2 diabetes mellitus with mild nonproliferative diabetic retinopathy without macular edema, unspecified eye: Secondary | ICD-10-CM | POA: Diagnosis not present

## 2015-09-10 DIAGNOSIS — D631 Anemia in chronic kidney disease: Secondary | ICD-10-CM | POA: Diagnosis not present

## 2015-09-10 DIAGNOSIS — D649 Anemia, unspecified: Secondary | ICD-10-CM | POA: Insufficient documentation

## 2015-09-10 DIAGNOSIS — N186 End stage renal disease: Secondary | ICD-10-CM | POA: Diagnosis not present

## 2015-09-10 DIAGNOSIS — D509 Iron deficiency anemia, unspecified: Secondary | ICD-10-CM | POA: Diagnosis not present

## 2015-09-10 DIAGNOSIS — N2581 Secondary hyperparathyroidism of renal origin: Secondary | ICD-10-CM | POA: Diagnosis not present

## 2015-09-10 LAB — HEMOGLOBIN: Hemoglobin: 8 g/dL — ABNORMAL LOW (ref 13.0–18.0)

## 2015-09-12 DIAGNOSIS — N2581 Secondary hyperparathyroidism of renal origin: Secondary | ICD-10-CM | POA: Diagnosis not present

## 2015-09-12 DIAGNOSIS — D509 Iron deficiency anemia, unspecified: Secondary | ICD-10-CM | POA: Diagnosis not present

## 2015-09-12 DIAGNOSIS — E113299 Type 2 diabetes mellitus with mild nonproliferative diabetic retinopathy without macular edema, unspecified eye: Secondary | ICD-10-CM | POA: Diagnosis not present

## 2015-09-12 DIAGNOSIS — D631 Anemia in chronic kidney disease: Secondary | ICD-10-CM | POA: Diagnosis not present

## 2015-09-12 DIAGNOSIS — N186 End stage renal disease: Secondary | ICD-10-CM | POA: Diagnosis not present

## 2015-09-14 DIAGNOSIS — E113299 Type 2 diabetes mellitus with mild nonproliferative diabetic retinopathy without macular edema, unspecified eye: Secondary | ICD-10-CM | POA: Diagnosis not present

## 2015-09-14 DIAGNOSIS — D631 Anemia in chronic kidney disease: Secondary | ICD-10-CM | POA: Diagnosis not present

## 2015-09-14 DIAGNOSIS — N186 End stage renal disease: Secondary | ICD-10-CM | POA: Diagnosis not present

## 2015-09-14 DIAGNOSIS — N2581 Secondary hyperparathyroidism of renal origin: Secondary | ICD-10-CM | POA: Diagnosis not present

## 2015-09-14 DIAGNOSIS — D509 Iron deficiency anemia, unspecified: Secondary | ICD-10-CM | POA: Diagnosis not present

## 2015-09-16 DIAGNOSIS — I10 Essential (primary) hypertension: Secondary | ICD-10-CM | POA: Diagnosis not present

## 2015-09-16 DIAGNOSIS — E103393 Type 1 diabetes mellitus with moderate nonproliferative diabetic retinopathy without macular edema, bilateral: Secondary | ICD-10-CM | POA: Diagnosis not present

## 2015-09-16 DIAGNOSIS — E1042 Type 1 diabetes mellitus with diabetic polyneuropathy: Secondary | ICD-10-CM | POA: Diagnosis not present

## 2015-09-16 DIAGNOSIS — E1065 Type 1 diabetes mellitus with hyperglycemia: Secondary | ICD-10-CM | POA: Diagnosis not present

## 2015-09-16 DIAGNOSIS — Z992 Dependence on renal dialysis: Secondary | ICD-10-CM | POA: Diagnosis not present

## 2015-09-16 DIAGNOSIS — E1022 Type 1 diabetes mellitus with diabetic chronic kidney disease: Secondary | ICD-10-CM | POA: Diagnosis not present

## 2015-09-16 DIAGNOSIS — N186 End stage renal disease: Secondary | ICD-10-CM | POA: Diagnosis not present

## 2015-09-17 ENCOUNTER — Other Ambulatory Visit
Admission: RE | Admit: 2015-09-17 | Discharge: 2015-09-17 | Disposition: A | Discharge status: Home / Self Care | Payer: Medicare Other | Source: Ambulatory Visit | Attending: Nephrology | Admitting: Nephrology

## 2015-09-17 DIAGNOSIS — D509 Iron deficiency anemia, unspecified: Secondary | ICD-10-CM | POA: Diagnosis not present

## 2015-09-17 DIAGNOSIS — E113299 Type 2 diabetes mellitus with mild nonproliferative diabetic retinopathy without macular edema, unspecified eye: Secondary | ICD-10-CM | POA: Diagnosis not present

## 2015-09-17 DIAGNOSIS — D631 Anemia in chronic kidney disease: Secondary | ICD-10-CM | POA: Diagnosis not present

## 2015-09-17 DIAGNOSIS — N2581 Secondary hyperparathyroidism of renal origin: Secondary | ICD-10-CM | POA: Diagnosis not present

## 2015-09-17 DIAGNOSIS — N186 End stage renal disease: Secondary | ICD-10-CM | POA: Diagnosis not present

## 2015-09-17 LAB — HEMOGLOBIN: Hemoglobin: 8.4 g/dL — ABNORMAL LOW (ref 13.0–18.0)

## 2015-09-19 DIAGNOSIS — N186 End stage renal disease: Secondary | ICD-10-CM | POA: Diagnosis not present

## 2015-09-19 DIAGNOSIS — D631 Anemia in chronic kidney disease: Secondary | ICD-10-CM | POA: Diagnosis not present

## 2015-09-19 DIAGNOSIS — N2581 Secondary hyperparathyroidism of renal origin: Secondary | ICD-10-CM | POA: Diagnosis not present

## 2015-09-19 DIAGNOSIS — E113299 Type 2 diabetes mellitus with mild nonproliferative diabetic retinopathy without macular edema, unspecified eye: Secondary | ICD-10-CM | POA: Diagnosis not present

## 2015-09-19 DIAGNOSIS — D509 Iron deficiency anemia, unspecified: Secondary | ICD-10-CM | POA: Diagnosis not present

## 2015-09-21 DIAGNOSIS — E113299 Type 2 diabetes mellitus with mild nonproliferative diabetic retinopathy without macular edema, unspecified eye: Secondary | ICD-10-CM | POA: Diagnosis not present

## 2015-09-21 DIAGNOSIS — D631 Anemia in chronic kidney disease: Secondary | ICD-10-CM | POA: Diagnosis not present

## 2015-09-21 DIAGNOSIS — N186 End stage renal disease: Secondary | ICD-10-CM | POA: Diagnosis not present

## 2015-09-21 DIAGNOSIS — N2581 Secondary hyperparathyroidism of renal origin: Secondary | ICD-10-CM | POA: Diagnosis not present

## 2015-09-21 DIAGNOSIS — D509 Iron deficiency anemia, unspecified: Secondary | ICD-10-CM | POA: Diagnosis not present

## 2015-09-24 DIAGNOSIS — E113299 Type 2 diabetes mellitus with mild nonproliferative diabetic retinopathy without macular edema, unspecified eye: Secondary | ICD-10-CM | POA: Diagnosis not present

## 2015-09-24 DIAGNOSIS — D631 Anemia in chronic kidney disease: Secondary | ICD-10-CM | POA: Diagnosis not present

## 2015-09-24 DIAGNOSIS — D509 Iron deficiency anemia, unspecified: Secondary | ICD-10-CM | POA: Diagnosis not present

## 2015-09-24 DIAGNOSIS — N186 End stage renal disease: Secondary | ICD-10-CM | POA: Diagnosis not present

## 2015-09-24 DIAGNOSIS — N2581 Secondary hyperparathyroidism of renal origin: Secondary | ICD-10-CM | POA: Diagnosis not present

## 2015-09-26 DIAGNOSIS — N2581 Secondary hyperparathyroidism of renal origin: Secondary | ICD-10-CM | POA: Diagnosis not present

## 2015-09-26 DIAGNOSIS — D509 Iron deficiency anemia, unspecified: Secondary | ICD-10-CM | POA: Diagnosis not present

## 2015-09-26 DIAGNOSIS — N186 End stage renal disease: Secondary | ICD-10-CM | POA: Diagnosis not present

## 2015-09-26 DIAGNOSIS — D631 Anemia in chronic kidney disease: Secondary | ICD-10-CM | POA: Diagnosis not present

## 2015-09-26 DIAGNOSIS — E113299 Type 2 diabetes mellitus with mild nonproliferative diabetic retinopathy without macular edema, unspecified eye: Secondary | ICD-10-CM | POA: Diagnosis not present

## 2015-09-28 DIAGNOSIS — Z992 Dependence on renal dialysis: Secondary | ICD-10-CM | POA: Diagnosis not present

## 2015-09-28 DIAGNOSIS — E113299 Type 2 diabetes mellitus with mild nonproliferative diabetic retinopathy without macular edema, unspecified eye: Secondary | ICD-10-CM | POA: Diagnosis not present

## 2015-09-28 DIAGNOSIS — D631 Anemia in chronic kidney disease: Secondary | ICD-10-CM | POA: Diagnosis not present

## 2015-09-28 DIAGNOSIS — I120 Hypertensive chronic kidney disease with stage 5 chronic kidney disease or end stage renal disease: Secondary | ICD-10-CM | POA: Diagnosis not present

## 2015-09-28 DIAGNOSIS — D509 Iron deficiency anemia, unspecified: Secondary | ICD-10-CM | POA: Diagnosis not present

## 2015-09-28 DIAGNOSIS — N186 End stage renal disease: Secondary | ICD-10-CM | POA: Diagnosis not present

## 2015-09-28 DIAGNOSIS — N2581 Secondary hyperparathyroidism of renal origin: Secondary | ICD-10-CM | POA: Diagnosis not present

## 2015-10-01 DIAGNOSIS — D631 Anemia in chronic kidney disease: Secondary | ICD-10-CM | POA: Diagnosis not present

## 2015-10-01 DIAGNOSIS — N186 End stage renal disease: Secondary | ICD-10-CM | POA: Diagnosis not present

## 2015-10-01 DIAGNOSIS — E113299 Type 2 diabetes mellitus with mild nonproliferative diabetic retinopathy without macular edema, unspecified eye: Secondary | ICD-10-CM | POA: Diagnosis not present

## 2015-10-01 DIAGNOSIS — N2581 Secondary hyperparathyroidism of renal origin: Secondary | ICD-10-CM | POA: Diagnosis not present

## 2015-10-01 DIAGNOSIS — D509 Iron deficiency anemia, unspecified: Secondary | ICD-10-CM | POA: Diagnosis not present

## 2015-10-03 DIAGNOSIS — D509 Iron deficiency anemia, unspecified: Secondary | ICD-10-CM | POA: Diagnosis not present

## 2015-10-03 DIAGNOSIS — N2581 Secondary hyperparathyroidism of renal origin: Secondary | ICD-10-CM | POA: Diagnosis not present

## 2015-10-03 DIAGNOSIS — N186 End stage renal disease: Secondary | ICD-10-CM | POA: Diagnosis not present

## 2015-10-03 DIAGNOSIS — D631 Anemia in chronic kidney disease: Secondary | ICD-10-CM | POA: Diagnosis not present

## 2015-10-03 DIAGNOSIS — E113299 Type 2 diabetes mellitus with mild nonproliferative diabetic retinopathy without macular edema, unspecified eye: Secondary | ICD-10-CM | POA: Diagnosis not present

## 2015-10-06 DIAGNOSIS — N186 End stage renal disease: Secondary | ICD-10-CM | POA: Diagnosis not present

## 2015-10-06 DIAGNOSIS — E113299 Type 2 diabetes mellitus with mild nonproliferative diabetic retinopathy without macular edema, unspecified eye: Secondary | ICD-10-CM | POA: Diagnosis not present

## 2015-10-06 DIAGNOSIS — N2581 Secondary hyperparathyroidism of renal origin: Secondary | ICD-10-CM | POA: Diagnosis not present

## 2015-10-06 DIAGNOSIS — D631 Anemia in chronic kidney disease: Secondary | ICD-10-CM | POA: Diagnosis not present

## 2015-10-06 DIAGNOSIS — D509 Iron deficiency anemia, unspecified: Secondary | ICD-10-CM | POA: Diagnosis not present

## 2015-10-08 DIAGNOSIS — E113299 Type 2 diabetes mellitus with mild nonproliferative diabetic retinopathy without macular edema, unspecified eye: Secondary | ICD-10-CM | POA: Diagnosis not present

## 2015-10-08 DIAGNOSIS — N186 End stage renal disease: Secondary | ICD-10-CM | POA: Diagnosis not present

## 2015-10-08 DIAGNOSIS — D509 Iron deficiency anemia, unspecified: Secondary | ICD-10-CM | POA: Diagnosis not present

## 2015-10-08 DIAGNOSIS — N2581 Secondary hyperparathyroidism of renal origin: Secondary | ICD-10-CM | POA: Diagnosis not present

## 2015-10-08 DIAGNOSIS — D631 Anemia in chronic kidney disease: Secondary | ICD-10-CM | POA: Diagnosis not present

## 2015-10-10 DIAGNOSIS — D509 Iron deficiency anemia, unspecified: Secondary | ICD-10-CM | POA: Diagnosis not present

## 2015-10-10 DIAGNOSIS — N186 End stage renal disease: Secondary | ICD-10-CM | POA: Diagnosis not present

## 2015-10-10 DIAGNOSIS — N2581 Secondary hyperparathyroidism of renal origin: Secondary | ICD-10-CM | POA: Diagnosis not present

## 2015-10-10 DIAGNOSIS — E113299 Type 2 diabetes mellitus with mild nonproliferative diabetic retinopathy without macular edema, unspecified eye: Secondary | ICD-10-CM | POA: Diagnosis not present

## 2015-10-10 DIAGNOSIS — D631 Anemia in chronic kidney disease: Secondary | ICD-10-CM | POA: Diagnosis not present

## 2015-10-12 DIAGNOSIS — D509 Iron deficiency anemia, unspecified: Secondary | ICD-10-CM | POA: Diagnosis not present

## 2015-10-12 DIAGNOSIS — N2581 Secondary hyperparathyroidism of renal origin: Secondary | ICD-10-CM | POA: Diagnosis not present

## 2015-10-12 DIAGNOSIS — D631 Anemia in chronic kidney disease: Secondary | ICD-10-CM | POA: Diagnosis not present

## 2015-10-12 DIAGNOSIS — E113299 Type 2 diabetes mellitus with mild nonproliferative diabetic retinopathy without macular edema, unspecified eye: Secondary | ICD-10-CM | POA: Diagnosis not present

## 2015-10-12 DIAGNOSIS — N186 End stage renal disease: Secondary | ICD-10-CM | POA: Diagnosis not present

## 2015-10-15 DIAGNOSIS — N2581 Secondary hyperparathyroidism of renal origin: Secondary | ICD-10-CM | POA: Diagnosis not present

## 2015-10-15 DIAGNOSIS — D509 Iron deficiency anemia, unspecified: Secondary | ICD-10-CM | POA: Diagnosis not present

## 2015-10-15 DIAGNOSIS — N186 End stage renal disease: Secondary | ICD-10-CM | POA: Diagnosis not present

## 2015-10-15 DIAGNOSIS — E113299 Type 2 diabetes mellitus with mild nonproliferative diabetic retinopathy without macular edema, unspecified eye: Secondary | ICD-10-CM | POA: Diagnosis not present

## 2015-10-15 DIAGNOSIS — D631 Anemia in chronic kidney disease: Secondary | ICD-10-CM | POA: Diagnosis not present

## 2015-10-17 DIAGNOSIS — E113299 Type 2 diabetes mellitus with mild nonproliferative diabetic retinopathy without macular edema, unspecified eye: Secondary | ICD-10-CM | POA: Diagnosis not present

## 2015-10-17 DIAGNOSIS — D631 Anemia in chronic kidney disease: Secondary | ICD-10-CM | POA: Diagnosis not present

## 2015-10-17 DIAGNOSIS — D509 Iron deficiency anemia, unspecified: Secondary | ICD-10-CM | POA: Diagnosis not present

## 2015-10-17 DIAGNOSIS — N186 End stage renal disease: Secondary | ICD-10-CM | POA: Diagnosis not present

## 2015-10-17 DIAGNOSIS — N2581 Secondary hyperparathyroidism of renal origin: Secondary | ICD-10-CM | POA: Diagnosis not present

## 2015-10-19 DIAGNOSIS — E113299 Type 2 diabetes mellitus with mild nonproliferative diabetic retinopathy without macular edema, unspecified eye: Secondary | ICD-10-CM | POA: Diagnosis not present

## 2015-10-19 DIAGNOSIS — D509 Iron deficiency anemia, unspecified: Secondary | ICD-10-CM | POA: Diagnosis not present

## 2015-10-19 DIAGNOSIS — N2581 Secondary hyperparathyroidism of renal origin: Secondary | ICD-10-CM | POA: Diagnosis not present

## 2015-10-19 DIAGNOSIS — N186 End stage renal disease: Secondary | ICD-10-CM | POA: Diagnosis not present

## 2015-10-19 DIAGNOSIS — D631 Anemia in chronic kidney disease: Secondary | ICD-10-CM | POA: Diagnosis not present

## 2015-10-22 DIAGNOSIS — N186 End stage renal disease: Secondary | ICD-10-CM | POA: Diagnosis not present

## 2015-10-22 DIAGNOSIS — E113299 Type 2 diabetes mellitus with mild nonproliferative diabetic retinopathy without macular edema, unspecified eye: Secondary | ICD-10-CM | POA: Diagnosis not present

## 2015-10-22 DIAGNOSIS — D631 Anemia in chronic kidney disease: Secondary | ICD-10-CM | POA: Diagnosis not present

## 2015-10-22 DIAGNOSIS — N2581 Secondary hyperparathyroidism of renal origin: Secondary | ICD-10-CM | POA: Diagnosis not present

## 2015-10-22 DIAGNOSIS — D509 Iron deficiency anemia, unspecified: Secondary | ICD-10-CM | POA: Diagnosis not present

## 2015-10-24 DIAGNOSIS — E113299 Type 2 diabetes mellitus with mild nonproliferative diabetic retinopathy without macular edema, unspecified eye: Secondary | ICD-10-CM | POA: Diagnosis not present

## 2015-10-24 DIAGNOSIS — N2581 Secondary hyperparathyroidism of renal origin: Secondary | ICD-10-CM | POA: Diagnosis not present

## 2015-10-24 DIAGNOSIS — D509 Iron deficiency anemia, unspecified: Secondary | ICD-10-CM | POA: Diagnosis not present

## 2015-10-24 DIAGNOSIS — D631 Anemia in chronic kidney disease: Secondary | ICD-10-CM | POA: Diagnosis not present

## 2015-10-24 DIAGNOSIS — N186 End stage renal disease: Secondary | ICD-10-CM | POA: Diagnosis not present

## 2015-10-26 DIAGNOSIS — N186 End stage renal disease: Secondary | ICD-10-CM | POA: Diagnosis not present

## 2015-10-26 DIAGNOSIS — D631 Anemia in chronic kidney disease: Secondary | ICD-10-CM | POA: Diagnosis not present

## 2015-10-26 DIAGNOSIS — N2581 Secondary hyperparathyroidism of renal origin: Secondary | ICD-10-CM | POA: Diagnosis not present

## 2015-10-26 DIAGNOSIS — D509 Iron deficiency anemia, unspecified: Secondary | ICD-10-CM | POA: Diagnosis not present

## 2015-10-26 DIAGNOSIS — E113299 Type 2 diabetes mellitus with mild nonproliferative diabetic retinopathy without macular edema, unspecified eye: Secondary | ICD-10-CM | POA: Diagnosis not present

## 2015-10-29 DIAGNOSIS — E113299 Type 2 diabetes mellitus with mild nonproliferative diabetic retinopathy without macular edema, unspecified eye: Secondary | ICD-10-CM | POA: Diagnosis not present

## 2015-10-29 DIAGNOSIS — D631 Anemia in chronic kidney disease: Secondary | ICD-10-CM | POA: Diagnosis not present

## 2015-10-29 DIAGNOSIS — D509 Iron deficiency anemia, unspecified: Secondary | ICD-10-CM | POA: Diagnosis not present

## 2015-10-29 DIAGNOSIS — I120 Hypertensive chronic kidney disease with stage 5 chronic kidney disease or end stage renal disease: Secondary | ICD-10-CM | POA: Diagnosis not present

## 2015-10-29 DIAGNOSIS — N2581 Secondary hyperparathyroidism of renal origin: Secondary | ICD-10-CM | POA: Diagnosis not present

## 2015-10-29 DIAGNOSIS — Z992 Dependence on renal dialysis: Secondary | ICD-10-CM | POA: Diagnosis not present

## 2015-10-29 DIAGNOSIS — N186 End stage renal disease: Secondary | ICD-10-CM | POA: Diagnosis not present

## 2015-10-31 DIAGNOSIS — D631 Anemia in chronic kidney disease: Secondary | ICD-10-CM | POA: Diagnosis not present

## 2015-10-31 DIAGNOSIS — N186 End stage renal disease: Secondary | ICD-10-CM | POA: Diagnosis not present

## 2015-10-31 DIAGNOSIS — N2581 Secondary hyperparathyroidism of renal origin: Secondary | ICD-10-CM | POA: Diagnosis not present

## 2015-10-31 DIAGNOSIS — E113299 Type 2 diabetes mellitus with mild nonproliferative diabetic retinopathy without macular edema, unspecified eye: Secondary | ICD-10-CM | POA: Diagnosis not present

## 2015-11-02 DIAGNOSIS — N186 End stage renal disease: Secondary | ICD-10-CM | POA: Diagnosis not present

## 2015-11-02 DIAGNOSIS — N2581 Secondary hyperparathyroidism of renal origin: Secondary | ICD-10-CM | POA: Diagnosis not present

## 2015-11-02 DIAGNOSIS — E113299 Type 2 diabetes mellitus with mild nonproliferative diabetic retinopathy without macular edema, unspecified eye: Secondary | ICD-10-CM | POA: Diagnosis not present

## 2015-11-02 DIAGNOSIS — D631 Anemia in chronic kidney disease: Secondary | ICD-10-CM | POA: Diagnosis not present

## 2015-11-05 DIAGNOSIS — N186 End stage renal disease: Secondary | ICD-10-CM | POA: Diagnosis not present

## 2015-11-05 DIAGNOSIS — D631 Anemia in chronic kidney disease: Secondary | ICD-10-CM | POA: Diagnosis not present

## 2015-11-05 DIAGNOSIS — N2581 Secondary hyperparathyroidism of renal origin: Secondary | ICD-10-CM | POA: Diagnosis not present

## 2015-11-05 DIAGNOSIS — E113299 Type 2 diabetes mellitus with mild nonproliferative diabetic retinopathy without macular edema, unspecified eye: Secondary | ICD-10-CM | POA: Diagnosis not present

## 2015-11-07 DIAGNOSIS — N2581 Secondary hyperparathyroidism of renal origin: Secondary | ICD-10-CM | POA: Diagnosis not present

## 2015-11-07 DIAGNOSIS — E113299 Type 2 diabetes mellitus with mild nonproliferative diabetic retinopathy without macular edema, unspecified eye: Secondary | ICD-10-CM | POA: Diagnosis not present

## 2015-11-07 DIAGNOSIS — N186 End stage renal disease: Secondary | ICD-10-CM | POA: Diagnosis not present

## 2015-11-07 DIAGNOSIS — D631 Anemia in chronic kidney disease: Secondary | ICD-10-CM | POA: Diagnosis not present

## 2015-11-09 DIAGNOSIS — E113299 Type 2 diabetes mellitus with mild nonproliferative diabetic retinopathy without macular edema, unspecified eye: Secondary | ICD-10-CM | POA: Diagnosis not present

## 2015-11-09 DIAGNOSIS — D631 Anemia in chronic kidney disease: Secondary | ICD-10-CM | POA: Diagnosis not present

## 2015-11-09 DIAGNOSIS — N186 End stage renal disease: Secondary | ICD-10-CM | POA: Diagnosis not present

## 2015-11-09 DIAGNOSIS — N2581 Secondary hyperparathyroidism of renal origin: Secondary | ICD-10-CM | POA: Diagnosis not present

## 2015-11-12 DIAGNOSIS — E113299 Type 2 diabetes mellitus with mild nonproliferative diabetic retinopathy without macular edema, unspecified eye: Secondary | ICD-10-CM | POA: Diagnosis not present

## 2015-11-12 DIAGNOSIS — N2581 Secondary hyperparathyroidism of renal origin: Secondary | ICD-10-CM | POA: Diagnosis not present

## 2015-11-12 DIAGNOSIS — N186 End stage renal disease: Secondary | ICD-10-CM | POA: Diagnosis not present

## 2015-11-12 DIAGNOSIS — D631 Anemia in chronic kidney disease: Secondary | ICD-10-CM | POA: Diagnosis not present

## 2015-11-14 DIAGNOSIS — N2581 Secondary hyperparathyroidism of renal origin: Secondary | ICD-10-CM | POA: Diagnosis not present

## 2015-11-14 DIAGNOSIS — N186 End stage renal disease: Secondary | ICD-10-CM | POA: Diagnosis not present

## 2015-11-14 DIAGNOSIS — D631 Anemia in chronic kidney disease: Secondary | ICD-10-CM | POA: Diagnosis not present

## 2015-11-14 DIAGNOSIS — E113299 Type 2 diabetes mellitus with mild nonproliferative diabetic retinopathy without macular edema, unspecified eye: Secondary | ICD-10-CM | POA: Diagnosis not present

## 2015-11-16 DIAGNOSIS — E113299 Type 2 diabetes mellitus with mild nonproliferative diabetic retinopathy without macular edema, unspecified eye: Secondary | ICD-10-CM | POA: Diagnosis not present

## 2015-11-16 DIAGNOSIS — N2581 Secondary hyperparathyroidism of renal origin: Secondary | ICD-10-CM | POA: Diagnosis not present

## 2015-11-16 DIAGNOSIS — N186 End stage renal disease: Secondary | ICD-10-CM | POA: Diagnosis not present

## 2015-11-16 DIAGNOSIS — D631 Anemia in chronic kidney disease: Secondary | ICD-10-CM | POA: Diagnosis not present

## 2015-11-19 DIAGNOSIS — E113299 Type 2 diabetes mellitus with mild nonproliferative diabetic retinopathy without macular edema, unspecified eye: Secondary | ICD-10-CM | POA: Diagnosis not present

## 2015-11-19 DIAGNOSIS — D631 Anemia in chronic kidney disease: Secondary | ICD-10-CM | POA: Diagnosis not present

## 2015-11-19 DIAGNOSIS — N186 End stage renal disease: Secondary | ICD-10-CM | POA: Diagnosis not present

## 2015-11-19 DIAGNOSIS — N2581 Secondary hyperparathyroidism of renal origin: Secondary | ICD-10-CM | POA: Diagnosis not present

## 2015-11-21 DIAGNOSIS — N186 End stage renal disease: Secondary | ICD-10-CM | POA: Diagnosis not present

## 2015-11-21 DIAGNOSIS — D631 Anemia in chronic kidney disease: Secondary | ICD-10-CM | POA: Diagnosis not present

## 2015-11-21 DIAGNOSIS — N2581 Secondary hyperparathyroidism of renal origin: Secondary | ICD-10-CM | POA: Diagnosis not present

## 2015-11-21 DIAGNOSIS — E113299 Type 2 diabetes mellitus with mild nonproliferative diabetic retinopathy without macular edema, unspecified eye: Secondary | ICD-10-CM | POA: Diagnosis not present

## 2015-11-23 DIAGNOSIS — N186 End stage renal disease: Secondary | ICD-10-CM | POA: Diagnosis not present

## 2015-11-23 DIAGNOSIS — N2581 Secondary hyperparathyroidism of renal origin: Secondary | ICD-10-CM | POA: Diagnosis not present

## 2015-11-23 DIAGNOSIS — E113299 Type 2 diabetes mellitus with mild nonproliferative diabetic retinopathy without macular edema, unspecified eye: Secondary | ICD-10-CM | POA: Diagnosis not present

## 2015-11-23 DIAGNOSIS — D631 Anemia in chronic kidney disease: Secondary | ICD-10-CM | POA: Diagnosis not present

## 2015-11-26 DIAGNOSIS — D631 Anemia in chronic kidney disease: Secondary | ICD-10-CM | POA: Diagnosis not present

## 2015-11-26 DIAGNOSIS — E113299 Type 2 diabetes mellitus with mild nonproliferative diabetic retinopathy without macular edema, unspecified eye: Secondary | ICD-10-CM | POA: Diagnosis not present

## 2015-11-26 DIAGNOSIS — N2581 Secondary hyperparathyroidism of renal origin: Secondary | ICD-10-CM | POA: Diagnosis not present

## 2015-11-26 DIAGNOSIS — I120 Hypertensive chronic kidney disease with stage 5 chronic kidney disease or end stage renal disease: Secondary | ICD-10-CM | POA: Diagnosis not present

## 2015-11-26 DIAGNOSIS — Z992 Dependence on renal dialysis: Secondary | ICD-10-CM | POA: Diagnosis not present

## 2015-11-26 DIAGNOSIS — N186 End stage renal disease: Secondary | ICD-10-CM | POA: Diagnosis not present

## 2015-11-28 DIAGNOSIS — N186 End stage renal disease: Secondary | ICD-10-CM | POA: Diagnosis not present

## 2015-11-28 DIAGNOSIS — D509 Iron deficiency anemia, unspecified: Secondary | ICD-10-CM | POA: Diagnosis not present

## 2015-11-28 DIAGNOSIS — D631 Anemia in chronic kidney disease: Secondary | ICD-10-CM | POA: Diagnosis not present

## 2015-11-28 DIAGNOSIS — E113299 Type 2 diabetes mellitus with mild nonproliferative diabetic retinopathy without macular edema, unspecified eye: Secondary | ICD-10-CM | POA: Diagnosis not present

## 2015-11-28 DIAGNOSIS — N2581 Secondary hyperparathyroidism of renal origin: Secondary | ICD-10-CM | POA: Diagnosis not present

## 2015-11-28 DIAGNOSIS — E1129 Type 2 diabetes mellitus with other diabetic kidney complication: Secondary | ICD-10-CM | POA: Diagnosis not present

## 2015-11-30 DIAGNOSIS — N2581 Secondary hyperparathyroidism of renal origin: Secondary | ICD-10-CM | POA: Diagnosis not present

## 2015-11-30 DIAGNOSIS — D631 Anemia in chronic kidney disease: Secondary | ICD-10-CM | POA: Diagnosis not present

## 2015-11-30 DIAGNOSIS — E113299 Type 2 diabetes mellitus with mild nonproliferative diabetic retinopathy without macular edema, unspecified eye: Secondary | ICD-10-CM | POA: Diagnosis not present

## 2015-11-30 DIAGNOSIS — N186 End stage renal disease: Secondary | ICD-10-CM | POA: Diagnosis not present

## 2015-11-30 DIAGNOSIS — D509 Iron deficiency anemia, unspecified: Secondary | ICD-10-CM | POA: Diagnosis not present

## 2015-11-30 DIAGNOSIS — E1129 Type 2 diabetes mellitus with other diabetic kidney complication: Secondary | ICD-10-CM | POA: Diagnosis not present

## 2015-12-03 DIAGNOSIS — D509 Iron deficiency anemia, unspecified: Secondary | ICD-10-CM | POA: Diagnosis not present

## 2015-12-03 DIAGNOSIS — D631 Anemia in chronic kidney disease: Secondary | ICD-10-CM | POA: Diagnosis not present

## 2015-12-03 DIAGNOSIS — E1129 Type 2 diabetes mellitus with other diabetic kidney complication: Secondary | ICD-10-CM | POA: Diagnosis not present

## 2015-12-03 DIAGNOSIS — N2581 Secondary hyperparathyroidism of renal origin: Secondary | ICD-10-CM | POA: Diagnosis not present

## 2015-12-03 DIAGNOSIS — E113299 Type 2 diabetes mellitus with mild nonproliferative diabetic retinopathy without macular edema, unspecified eye: Secondary | ICD-10-CM | POA: Diagnosis not present

## 2015-12-03 DIAGNOSIS — N186 End stage renal disease: Secondary | ICD-10-CM | POA: Diagnosis not present

## 2015-12-05 DIAGNOSIS — N2581 Secondary hyperparathyroidism of renal origin: Secondary | ICD-10-CM | POA: Diagnosis not present

## 2015-12-05 DIAGNOSIS — D509 Iron deficiency anemia, unspecified: Secondary | ICD-10-CM | POA: Diagnosis not present

## 2015-12-05 DIAGNOSIS — N186 End stage renal disease: Secondary | ICD-10-CM | POA: Diagnosis not present

## 2015-12-05 DIAGNOSIS — E113299 Type 2 diabetes mellitus with mild nonproliferative diabetic retinopathy without macular edema, unspecified eye: Secondary | ICD-10-CM | POA: Diagnosis not present

## 2015-12-05 DIAGNOSIS — D631 Anemia in chronic kidney disease: Secondary | ICD-10-CM | POA: Diagnosis not present

## 2015-12-05 DIAGNOSIS — E1129 Type 2 diabetes mellitus with other diabetic kidney complication: Secondary | ICD-10-CM | POA: Diagnosis not present

## 2015-12-07 DIAGNOSIS — E113299 Type 2 diabetes mellitus with mild nonproliferative diabetic retinopathy without macular edema, unspecified eye: Secondary | ICD-10-CM | POA: Diagnosis not present

## 2015-12-07 DIAGNOSIS — N2581 Secondary hyperparathyroidism of renal origin: Secondary | ICD-10-CM | POA: Diagnosis not present

## 2015-12-07 DIAGNOSIS — D631 Anemia in chronic kidney disease: Secondary | ICD-10-CM | POA: Diagnosis not present

## 2015-12-07 DIAGNOSIS — D509 Iron deficiency anemia, unspecified: Secondary | ICD-10-CM | POA: Diagnosis not present

## 2015-12-07 DIAGNOSIS — E1129 Type 2 diabetes mellitus with other diabetic kidney complication: Secondary | ICD-10-CM | POA: Diagnosis not present

## 2015-12-07 DIAGNOSIS — N186 End stage renal disease: Secondary | ICD-10-CM | POA: Diagnosis not present

## 2015-12-10 DIAGNOSIS — N2581 Secondary hyperparathyroidism of renal origin: Secondary | ICD-10-CM | POA: Diagnosis not present

## 2015-12-10 DIAGNOSIS — N186 End stage renal disease: Secondary | ICD-10-CM | POA: Diagnosis not present

## 2015-12-10 DIAGNOSIS — D631 Anemia in chronic kidney disease: Secondary | ICD-10-CM | POA: Diagnosis not present

## 2015-12-10 DIAGNOSIS — E113299 Type 2 diabetes mellitus with mild nonproliferative diabetic retinopathy without macular edema, unspecified eye: Secondary | ICD-10-CM | POA: Diagnosis not present

## 2015-12-10 DIAGNOSIS — D509 Iron deficiency anemia, unspecified: Secondary | ICD-10-CM | POA: Diagnosis not present

## 2015-12-10 DIAGNOSIS — E1129 Type 2 diabetes mellitus with other diabetic kidney complication: Secondary | ICD-10-CM | POA: Diagnosis not present

## 2015-12-12 DIAGNOSIS — N186 End stage renal disease: Secondary | ICD-10-CM | POA: Diagnosis not present

## 2015-12-12 DIAGNOSIS — D631 Anemia in chronic kidney disease: Secondary | ICD-10-CM | POA: Diagnosis not present

## 2015-12-12 DIAGNOSIS — D509 Iron deficiency anemia, unspecified: Secondary | ICD-10-CM | POA: Diagnosis not present

## 2015-12-12 DIAGNOSIS — E1129 Type 2 diabetes mellitus with other diabetic kidney complication: Secondary | ICD-10-CM | POA: Diagnosis not present

## 2015-12-12 DIAGNOSIS — N2581 Secondary hyperparathyroidism of renal origin: Secondary | ICD-10-CM | POA: Diagnosis not present

## 2015-12-12 DIAGNOSIS — E113299 Type 2 diabetes mellitus with mild nonproliferative diabetic retinopathy without macular edema, unspecified eye: Secondary | ICD-10-CM | POA: Diagnosis not present

## 2015-12-14 DIAGNOSIS — E113299 Type 2 diabetes mellitus with mild nonproliferative diabetic retinopathy without macular edema, unspecified eye: Secondary | ICD-10-CM | POA: Diagnosis not present

## 2015-12-14 DIAGNOSIS — D631 Anemia in chronic kidney disease: Secondary | ICD-10-CM | POA: Diagnosis not present

## 2015-12-14 DIAGNOSIS — N186 End stage renal disease: Secondary | ICD-10-CM | POA: Diagnosis not present

## 2015-12-14 DIAGNOSIS — N2581 Secondary hyperparathyroidism of renal origin: Secondary | ICD-10-CM | POA: Diagnosis not present

## 2015-12-14 DIAGNOSIS — D509 Iron deficiency anemia, unspecified: Secondary | ICD-10-CM | POA: Diagnosis not present

## 2015-12-14 DIAGNOSIS — E1129 Type 2 diabetes mellitus with other diabetic kidney complication: Secondary | ICD-10-CM | POA: Diagnosis not present

## 2015-12-17 DIAGNOSIS — N2581 Secondary hyperparathyroidism of renal origin: Secondary | ICD-10-CM | POA: Diagnosis not present

## 2015-12-17 DIAGNOSIS — E1129 Type 2 diabetes mellitus with other diabetic kidney complication: Secondary | ICD-10-CM | POA: Diagnosis not present

## 2015-12-17 DIAGNOSIS — E113299 Type 2 diabetes mellitus with mild nonproliferative diabetic retinopathy without macular edema, unspecified eye: Secondary | ICD-10-CM | POA: Diagnosis not present

## 2015-12-17 DIAGNOSIS — D631 Anemia in chronic kidney disease: Secondary | ICD-10-CM | POA: Diagnosis not present

## 2015-12-17 DIAGNOSIS — D509 Iron deficiency anemia, unspecified: Secondary | ICD-10-CM | POA: Diagnosis not present

## 2015-12-17 DIAGNOSIS — N186 End stage renal disease: Secondary | ICD-10-CM | POA: Diagnosis not present

## 2015-12-19 DIAGNOSIS — D509 Iron deficiency anemia, unspecified: Secondary | ICD-10-CM | POA: Diagnosis not present

## 2015-12-19 DIAGNOSIS — E1129 Type 2 diabetes mellitus with other diabetic kidney complication: Secondary | ICD-10-CM | POA: Diagnosis not present

## 2015-12-19 DIAGNOSIS — N2581 Secondary hyperparathyroidism of renal origin: Secondary | ICD-10-CM | POA: Diagnosis not present

## 2015-12-19 DIAGNOSIS — N186 End stage renal disease: Secondary | ICD-10-CM | POA: Diagnosis not present

## 2015-12-19 DIAGNOSIS — E113299 Type 2 diabetes mellitus with mild nonproliferative diabetic retinopathy without macular edema, unspecified eye: Secondary | ICD-10-CM | POA: Diagnosis not present

## 2015-12-19 DIAGNOSIS — D631 Anemia in chronic kidney disease: Secondary | ICD-10-CM | POA: Diagnosis not present

## 2015-12-21 DIAGNOSIS — E113299 Type 2 diabetes mellitus with mild nonproliferative diabetic retinopathy without macular edema, unspecified eye: Secondary | ICD-10-CM | POA: Diagnosis not present

## 2015-12-21 DIAGNOSIS — D509 Iron deficiency anemia, unspecified: Secondary | ICD-10-CM | POA: Diagnosis not present

## 2015-12-21 DIAGNOSIS — N186 End stage renal disease: Secondary | ICD-10-CM | POA: Diagnosis not present

## 2015-12-21 DIAGNOSIS — N2581 Secondary hyperparathyroidism of renal origin: Secondary | ICD-10-CM | POA: Diagnosis not present

## 2015-12-21 DIAGNOSIS — E1129 Type 2 diabetes mellitus with other diabetic kidney complication: Secondary | ICD-10-CM | POA: Diagnosis not present

## 2015-12-21 DIAGNOSIS — D631 Anemia in chronic kidney disease: Secondary | ICD-10-CM | POA: Diagnosis not present

## 2015-12-24 DIAGNOSIS — E113299 Type 2 diabetes mellitus with mild nonproliferative diabetic retinopathy without macular edema, unspecified eye: Secondary | ICD-10-CM | POA: Diagnosis not present

## 2015-12-24 DIAGNOSIS — D509 Iron deficiency anemia, unspecified: Secondary | ICD-10-CM | POA: Diagnosis not present

## 2015-12-24 DIAGNOSIS — N2581 Secondary hyperparathyroidism of renal origin: Secondary | ICD-10-CM | POA: Diagnosis not present

## 2015-12-24 DIAGNOSIS — N186 End stage renal disease: Secondary | ICD-10-CM | POA: Diagnosis not present

## 2015-12-24 DIAGNOSIS — E1129 Type 2 diabetes mellitus with other diabetic kidney complication: Secondary | ICD-10-CM | POA: Diagnosis not present

## 2015-12-24 DIAGNOSIS — D631 Anemia in chronic kidney disease: Secondary | ICD-10-CM | POA: Diagnosis not present

## 2015-12-26 DIAGNOSIS — N2581 Secondary hyperparathyroidism of renal origin: Secondary | ICD-10-CM | POA: Diagnosis not present

## 2015-12-26 DIAGNOSIS — E1129 Type 2 diabetes mellitus with other diabetic kidney complication: Secondary | ICD-10-CM | POA: Diagnosis not present

## 2015-12-26 DIAGNOSIS — D631 Anemia in chronic kidney disease: Secondary | ICD-10-CM | POA: Diagnosis not present

## 2015-12-26 DIAGNOSIS — D509 Iron deficiency anemia, unspecified: Secondary | ICD-10-CM | POA: Diagnosis not present

## 2015-12-26 DIAGNOSIS — N186 End stage renal disease: Secondary | ICD-10-CM | POA: Diagnosis not present

## 2015-12-26 DIAGNOSIS — E113299 Type 2 diabetes mellitus with mild nonproliferative diabetic retinopathy without macular edema, unspecified eye: Secondary | ICD-10-CM | POA: Diagnosis not present

## 2015-12-27 DIAGNOSIS — N186 End stage renal disease: Secondary | ICD-10-CM | POA: Diagnosis not present

## 2015-12-27 DIAGNOSIS — Z992 Dependence on renal dialysis: Secondary | ICD-10-CM | POA: Diagnosis not present

## 2015-12-27 DIAGNOSIS — I120 Hypertensive chronic kidney disease with stage 5 chronic kidney disease or end stage renal disease: Secondary | ICD-10-CM | POA: Diagnosis not present

## 2015-12-28 DIAGNOSIS — D509 Iron deficiency anemia, unspecified: Secondary | ICD-10-CM | POA: Diagnosis not present

## 2015-12-28 DIAGNOSIS — E113299 Type 2 diabetes mellitus with mild nonproliferative diabetic retinopathy without macular edema, unspecified eye: Secondary | ICD-10-CM | POA: Diagnosis not present

## 2015-12-28 DIAGNOSIS — N2581 Secondary hyperparathyroidism of renal origin: Secondary | ICD-10-CM | POA: Diagnosis not present

## 2015-12-28 DIAGNOSIS — E0810 Diabetes mellitus due to underlying condition with ketoacidosis without coma: Secondary | ICD-10-CM | POA: Diagnosis not present

## 2015-12-28 DIAGNOSIS — N186 End stage renal disease: Secondary | ICD-10-CM | POA: Diagnosis not present

## 2015-12-28 DIAGNOSIS — D631 Anemia in chronic kidney disease: Secondary | ICD-10-CM | POA: Diagnosis not present

## 2015-12-31 DIAGNOSIS — N2581 Secondary hyperparathyroidism of renal origin: Secondary | ICD-10-CM | POA: Diagnosis not present

## 2015-12-31 DIAGNOSIS — N186 End stage renal disease: Secondary | ICD-10-CM | POA: Diagnosis not present

## 2015-12-31 DIAGNOSIS — D509 Iron deficiency anemia, unspecified: Secondary | ICD-10-CM | POA: Diagnosis not present

## 2015-12-31 DIAGNOSIS — E113299 Type 2 diabetes mellitus with mild nonproliferative diabetic retinopathy without macular edema, unspecified eye: Secondary | ICD-10-CM | POA: Diagnosis not present

## 2015-12-31 DIAGNOSIS — E0810 Diabetes mellitus due to underlying condition with ketoacidosis without coma: Secondary | ICD-10-CM | POA: Diagnosis not present

## 2015-12-31 DIAGNOSIS — D631 Anemia in chronic kidney disease: Secondary | ICD-10-CM | POA: Diagnosis not present

## 2016-01-02 DIAGNOSIS — E0810 Diabetes mellitus due to underlying condition with ketoacidosis without coma: Secondary | ICD-10-CM | POA: Diagnosis not present

## 2016-01-02 DIAGNOSIS — D631 Anemia in chronic kidney disease: Secondary | ICD-10-CM | POA: Diagnosis not present

## 2016-01-02 DIAGNOSIS — N2581 Secondary hyperparathyroidism of renal origin: Secondary | ICD-10-CM | POA: Diagnosis not present

## 2016-01-02 DIAGNOSIS — D509 Iron deficiency anemia, unspecified: Secondary | ICD-10-CM | POA: Diagnosis not present

## 2016-01-02 DIAGNOSIS — E113299 Type 2 diabetes mellitus with mild nonproliferative diabetic retinopathy without macular edema, unspecified eye: Secondary | ICD-10-CM | POA: Diagnosis not present

## 2016-01-02 DIAGNOSIS — N186 End stage renal disease: Secondary | ICD-10-CM | POA: Diagnosis not present

## 2016-01-04 DIAGNOSIS — N2581 Secondary hyperparathyroidism of renal origin: Secondary | ICD-10-CM | POA: Diagnosis not present

## 2016-01-04 DIAGNOSIS — E0810 Diabetes mellitus due to underlying condition with ketoacidosis without coma: Secondary | ICD-10-CM | POA: Diagnosis not present

## 2016-01-04 DIAGNOSIS — N186 End stage renal disease: Secondary | ICD-10-CM | POA: Diagnosis not present

## 2016-01-04 DIAGNOSIS — E113299 Type 2 diabetes mellitus with mild nonproliferative diabetic retinopathy without macular edema, unspecified eye: Secondary | ICD-10-CM | POA: Diagnosis not present

## 2016-01-04 DIAGNOSIS — D509 Iron deficiency anemia, unspecified: Secondary | ICD-10-CM | POA: Diagnosis not present

## 2016-01-04 DIAGNOSIS — D631 Anemia in chronic kidney disease: Secondary | ICD-10-CM | POA: Diagnosis not present

## 2016-01-07 DIAGNOSIS — N186 End stage renal disease: Secondary | ICD-10-CM | POA: Diagnosis not present

## 2016-01-07 DIAGNOSIS — D631 Anemia in chronic kidney disease: Secondary | ICD-10-CM | POA: Diagnosis not present

## 2016-01-07 DIAGNOSIS — D509 Iron deficiency anemia, unspecified: Secondary | ICD-10-CM | POA: Diagnosis not present

## 2016-01-07 DIAGNOSIS — N2581 Secondary hyperparathyroidism of renal origin: Secondary | ICD-10-CM | POA: Diagnosis not present

## 2016-01-07 DIAGNOSIS — E113299 Type 2 diabetes mellitus with mild nonproliferative diabetic retinopathy without macular edema, unspecified eye: Secondary | ICD-10-CM | POA: Diagnosis not present

## 2016-01-07 DIAGNOSIS — E0810 Diabetes mellitus due to underlying condition with ketoacidosis without coma: Secondary | ICD-10-CM | POA: Diagnosis not present

## 2016-01-09 DIAGNOSIS — N186 End stage renal disease: Secondary | ICD-10-CM | POA: Diagnosis not present

## 2016-01-09 DIAGNOSIS — D631 Anemia in chronic kidney disease: Secondary | ICD-10-CM | POA: Diagnosis not present

## 2016-01-09 DIAGNOSIS — E0810 Diabetes mellitus due to underlying condition with ketoacidosis without coma: Secondary | ICD-10-CM | POA: Diagnosis not present

## 2016-01-09 DIAGNOSIS — E113299 Type 2 diabetes mellitus with mild nonproliferative diabetic retinopathy without macular edema, unspecified eye: Secondary | ICD-10-CM | POA: Diagnosis not present

## 2016-01-09 DIAGNOSIS — N2581 Secondary hyperparathyroidism of renal origin: Secondary | ICD-10-CM | POA: Diagnosis not present

## 2016-01-09 DIAGNOSIS — D509 Iron deficiency anemia, unspecified: Secondary | ICD-10-CM | POA: Diagnosis not present

## 2016-01-11 DIAGNOSIS — N186 End stage renal disease: Secondary | ICD-10-CM | POA: Diagnosis not present

## 2016-01-11 DIAGNOSIS — E113299 Type 2 diabetes mellitus with mild nonproliferative diabetic retinopathy without macular edema, unspecified eye: Secondary | ICD-10-CM | POA: Diagnosis not present

## 2016-01-11 DIAGNOSIS — E0810 Diabetes mellitus due to underlying condition with ketoacidosis without coma: Secondary | ICD-10-CM | POA: Diagnosis not present

## 2016-01-11 DIAGNOSIS — N2581 Secondary hyperparathyroidism of renal origin: Secondary | ICD-10-CM | POA: Diagnosis not present

## 2016-01-11 DIAGNOSIS — D509 Iron deficiency anemia, unspecified: Secondary | ICD-10-CM | POA: Diagnosis not present

## 2016-01-11 DIAGNOSIS — D631 Anemia in chronic kidney disease: Secondary | ICD-10-CM | POA: Diagnosis not present

## 2016-01-14 DIAGNOSIS — N2581 Secondary hyperparathyroidism of renal origin: Secondary | ICD-10-CM | POA: Diagnosis not present

## 2016-01-14 DIAGNOSIS — D509 Iron deficiency anemia, unspecified: Secondary | ICD-10-CM | POA: Diagnosis not present

## 2016-01-14 DIAGNOSIS — E0810 Diabetes mellitus due to underlying condition with ketoacidosis without coma: Secondary | ICD-10-CM | POA: Diagnosis not present

## 2016-01-14 DIAGNOSIS — N186 End stage renal disease: Secondary | ICD-10-CM | POA: Diagnosis not present

## 2016-01-14 DIAGNOSIS — E113299 Type 2 diabetes mellitus with mild nonproliferative diabetic retinopathy without macular edema, unspecified eye: Secondary | ICD-10-CM | POA: Diagnosis not present

## 2016-01-14 DIAGNOSIS — D631 Anemia in chronic kidney disease: Secondary | ICD-10-CM | POA: Diagnosis not present

## 2016-01-16 DIAGNOSIS — D509 Iron deficiency anemia, unspecified: Secondary | ICD-10-CM | POA: Diagnosis not present

## 2016-01-16 DIAGNOSIS — N2581 Secondary hyperparathyroidism of renal origin: Secondary | ICD-10-CM | POA: Diagnosis not present

## 2016-01-16 DIAGNOSIS — E113299 Type 2 diabetes mellitus with mild nonproliferative diabetic retinopathy without macular edema, unspecified eye: Secondary | ICD-10-CM | POA: Diagnosis not present

## 2016-01-16 DIAGNOSIS — D631 Anemia in chronic kidney disease: Secondary | ICD-10-CM | POA: Diagnosis not present

## 2016-01-16 DIAGNOSIS — E0810 Diabetes mellitus due to underlying condition with ketoacidosis without coma: Secondary | ICD-10-CM | POA: Diagnosis not present

## 2016-01-16 DIAGNOSIS — N186 End stage renal disease: Secondary | ICD-10-CM | POA: Diagnosis not present

## 2016-01-18 DIAGNOSIS — E0810 Diabetes mellitus due to underlying condition with ketoacidosis without coma: Secondary | ICD-10-CM | POA: Diagnosis not present

## 2016-01-18 DIAGNOSIS — E113299 Type 2 diabetes mellitus with mild nonproliferative diabetic retinopathy without macular edema, unspecified eye: Secondary | ICD-10-CM | POA: Diagnosis not present

## 2016-01-18 DIAGNOSIS — N2581 Secondary hyperparathyroidism of renal origin: Secondary | ICD-10-CM | POA: Diagnosis not present

## 2016-01-18 DIAGNOSIS — D631 Anemia in chronic kidney disease: Secondary | ICD-10-CM | POA: Diagnosis not present

## 2016-01-18 DIAGNOSIS — N186 End stage renal disease: Secondary | ICD-10-CM | POA: Diagnosis not present

## 2016-01-18 DIAGNOSIS — D509 Iron deficiency anemia, unspecified: Secondary | ICD-10-CM | POA: Diagnosis not present

## 2016-01-21 DIAGNOSIS — E0810 Diabetes mellitus due to underlying condition with ketoacidosis without coma: Secondary | ICD-10-CM | POA: Diagnosis not present

## 2016-01-21 DIAGNOSIS — N186 End stage renal disease: Secondary | ICD-10-CM | POA: Diagnosis not present

## 2016-01-21 DIAGNOSIS — D509 Iron deficiency anemia, unspecified: Secondary | ICD-10-CM | POA: Diagnosis not present

## 2016-01-21 DIAGNOSIS — E113299 Type 2 diabetes mellitus with mild nonproliferative diabetic retinopathy without macular edema, unspecified eye: Secondary | ICD-10-CM | POA: Diagnosis not present

## 2016-01-21 DIAGNOSIS — N2581 Secondary hyperparathyroidism of renal origin: Secondary | ICD-10-CM | POA: Diagnosis not present

## 2016-01-21 DIAGNOSIS — D631 Anemia in chronic kidney disease: Secondary | ICD-10-CM | POA: Diagnosis not present

## 2016-01-23 DIAGNOSIS — D631 Anemia in chronic kidney disease: Secondary | ICD-10-CM | POA: Diagnosis not present

## 2016-01-23 DIAGNOSIS — D509 Iron deficiency anemia, unspecified: Secondary | ICD-10-CM | POA: Diagnosis not present

## 2016-01-23 DIAGNOSIS — E0810 Diabetes mellitus due to underlying condition with ketoacidosis without coma: Secondary | ICD-10-CM | POA: Diagnosis not present

## 2016-01-23 DIAGNOSIS — N2581 Secondary hyperparathyroidism of renal origin: Secondary | ICD-10-CM | POA: Diagnosis not present

## 2016-01-23 DIAGNOSIS — N186 End stage renal disease: Secondary | ICD-10-CM | POA: Diagnosis not present

## 2016-01-23 DIAGNOSIS — E113299 Type 2 diabetes mellitus with mild nonproliferative diabetic retinopathy without macular edema, unspecified eye: Secondary | ICD-10-CM | POA: Diagnosis not present

## 2016-01-25 DIAGNOSIS — N2581 Secondary hyperparathyroidism of renal origin: Secondary | ICD-10-CM | POA: Diagnosis not present

## 2016-01-25 DIAGNOSIS — E113299 Type 2 diabetes mellitus with mild nonproliferative diabetic retinopathy without macular edema, unspecified eye: Secondary | ICD-10-CM | POA: Diagnosis not present

## 2016-01-25 DIAGNOSIS — D509 Iron deficiency anemia, unspecified: Secondary | ICD-10-CM | POA: Diagnosis not present

## 2016-01-25 DIAGNOSIS — N186 End stage renal disease: Secondary | ICD-10-CM | POA: Diagnosis not present

## 2016-01-25 DIAGNOSIS — D631 Anemia in chronic kidney disease: Secondary | ICD-10-CM | POA: Diagnosis not present

## 2016-01-25 DIAGNOSIS — E0810 Diabetes mellitus due to underlying condition with ketoacidosis without coma: Secondary | ICD-10-CM | POA: Diagnosis not present

## 2016-01-26 DIAGNOSIS — N186 End stage renal disease: Secondary | ICD-10-CM | POA: Diagnosis not present

## 2016-01-26 DIAGNOSIS — Z993 Dependence on wheelchair: Secondary | ICD-10-CM | POA: Diagnosis not present

## 2016-01-26 DIAGNOSIS — I120 Hypertensive chronic kidney disease with stage 5 chronic kidney disease or end stage renal disease: Secondary | ICD-10-CM | POA: Diagnosis not present

## 2016-01-28 DIAGNOSIS — N2581 Secondary hyperparathyroidism of renal origin: Secondary | ICD-10-CM | POA: Diagnosis not present

## 2016-01-28 DIAGNOSIS — D631 Anemia in chronic kidney disease: Secondary | ICD-10-CM | POA: Diagnosis not present

## 2016-01-28 DIAGNOSIS — D509 Iron deficiency anemia, unspecified: Secondary | ICD-10-CM | POA: Diagnosis not present

## 2016-01-28 DIAGNOSIS — N186 End stage renal disease: Secondary | ICD-10-CM | POA: Diagnosis not present

## 2016-01-28 DIAGNOSIS — E0810 Diabetes mellitus due to underlying condition with ketoacidosis without coma: Secondary | ICD-10-CM | POA: Diagnosis not present

## 2016-01-30 DIAGNOSIS — N186 End stage renal disease: Secondary | ICD-10-CM | POA: Diagnosis not present

## 2016-01-30 DIAGNOSIS — E0810 Diabetes mellitus due to underlying condition with ketoacidosis without coma: Secondary | ICD-10-CM | POA: Diagnosis not present

## 2016-01-30 DIAGNOSIS — D509 Iron deficiency anemia, unspecified: Secondary | ICD-10-CM | POA: Diagnosis not present

## 2016-01-30 DIAGNOSIS — N2581 Secondary hyperparathyroidism of renal origin: Secondary | ICD-10-CM | POA: Diagnosis not present

## 2016-01-30 DIAGNOSIS — D631 Anemia in chronic kidney disease: Secondary | ICD-10-CM | POA: Diagnosis not present

## 2016-01-31 DIAGNOSIS — N2581 Secondary hyperparathyroidism of renal origin: Secondary | ICD-10-CM | POA: Diagnosis not present

## 2016-01-31 DIAGNOSIS — N186 End stage renal disease: Secondary | ICD-10-CM | POA: Diagnosis not present

## 2016-01-31 DIAGNOSIS — E0810 Diabetes mellitus due to underlying condition with ketoacidosis without coma: Secondary | ICD-10-CM | POA: Diagnosis not present

## 2016-01-31 DIAGNOSIS — D631 Anemia in chronic kidney disease: Secondary | ICD-10-CM | POA: Diagnosis not present

## 2016-01-31 DIAGNOSIS — D509 Iron deficiency anemia, unspecified: Secondary | ICD-10-CM | POA: Diagnosis not present

## 2016-02-04 DIAGNOSIS — N2581 Secondary hyperparathyroidism of renal origin: Secondary | ICD-10-CM | POA: Diagnosis not present

## 2016-02-04 DIAGNOSIS — E0810 Diabetes mellitus due to underlying condition with ketoacidosis without coma: Secondary | ICD-10-CM | POA: Diagnosis not present

## 2016-02-04 DIAGNOSIS — D631 Anemia in chronic kidney disease: Secondary | ICD-10-CM | POA: Diagnosis not present

## 2016-02-04 DIAGNOSIS — D509 Iron deficiency anemia, unspecified: Secondary | ICD-10-CM | POA: Diagnosis not present

## 2016-02-04 DIAGNOSIS — N186 End stage renal disease: Secondary | ICD-10-CM | POA: Diagnosis not present

## 2016-02-06 DIAGNOSIS — D631 Anemia in chronic kidney disease: Secondary | ICD-10-CM | POA: Diagnosis not present

## 2016-02-06 DIAGNOSIS — N186 End stage renal disease: Secondary | ICD-10-CM | POA: Diagnosis not present

## 2016-02-06 DIAGNOSIS — E0810 Diabetes mellitus due to underlying condition with ketoacidosis without coma: Secondary | ICD-10-CM | POA: Diagnosis not present

## 2016-02-06 DIAGNOSIS — N2581 Secondary hyperparathyroidism of renal origin: Secondary | ICD-10-CM | POA: Diagnosis not present

## 2016-02-06 DIAGNOSIS — D509 Iron deficiency anemia, unspecified: Secondary | ICD-10-CM | POA: Diagnosis not present

## 2016-02-08 DIAGNOSIS — D631 Anemia in chronic kidney disease: Secondary | ICD-10-CM | POA: Diagnosis not present

## 2016-02-08 DIAGNOSIS — E0810 Diabetes mellitus due to underlying condition with ketoacidosis without coma: Secondary | ICD-10-CM | POA: Diagnosis not present

## 2016-02-08 DIAGNOSIS — D509 Iron deficiency anemia, unspecified: Secondary | ICD-10-CM | POA: Diagnosis not present

## 2016-02-08 DIAGNOSIS — N2581 Secondary hyperparathyroidism of renal origin: Secondary | ICD-10-CM | POA: Diagnosis not present

## 2016-02-08 DIAGNOSIS — N186 End stage renal disease: Secondary | ICD-10-CM | POA: Diagnosis not present

## 2016-02-11 DIAGNOSIS — N2581 Secondary hyperparathyroidism of renal origin: Secondary | ICD-10-CM | POA: Diagnosis not present

## 2016-02-11 DIAGNOSIS — D509 Iron deficiency anemia, unspecified: Secondary | ICD-10-CM | POA: Diagnosis not present

## 2016-02-11 DIAGNOSIS — D631 Anemia in chronic kidney disease: Secondary | ICD-10-CM | POA: Diagnosis not present

## 2016-02-11 DIAGNOSIS — E0810 Diabetes mellitus due to underlying condition with ketoacidosis without coma: Secondary | ICD-10-CM | POA: Diagnosis not present

## 2016-02-11 DIAGNOSIS — N186 End stage renal disease: Secondary | ICD-10-CM | POA: Diagnosis not present

## 2016-02-13 DIAGNOSIS — D631 Anemia in chronic kidney disease: Secondary | ICD-10-CM | POA: Diagnosis not present

## 2016-02-13 DIAGNOSIS — E0810 Diabetes mellitus due to underlying condition with ketoacidosis without coma: Secondary | ICD-10-CM | POA: Diagnosis not present

## 2016-02-13 DIAGNOSIS — N2581 Secondary hyperparathyroidism of renal origin: Secondary | ICD-10-CM | POA: Diagnosis not present

## 2016-02-13 DIAGNOSIS — N186 End stage renal disease: Secondary | ICD-10-CM | POA: Diagnosis not present

## 2016-02-13 DIAGNOSIS — D509 Iron deficiency anemia, unspecified: Secondary | ICD-10-CM | POA: Diagnosis not present

## 2016-02-15 DIAGNOSIS — D509 Iron deficiency anemia, unspecified: Secondary | ICD-10-CM | POA: Diagnosis not present

## 2016-02-15 DIAGNOSIS — D631 Anemia in chronic kidney disease: Secondary | ICD-10-CM | POA: Diagnosis not present

## 2016-02-15 DIAGNOSIS — E0810 Diabetes mellitus due to underlying condition with ketoacidosis without coma: Secondary | ICD-10-CM | POA: Diagnosis not present

## 2016-02-15 DIAGNOSIS — N2581 Secondary hyperparathyroidism of renal origin: Secondary | ICD-10-CM | POA: Diagnosis not present

## 2016-02-15 DIAGNOSIS — N186 End stage renal disease: Secondary | ICD-10-CM | POA: Diagnosis not present

## 2016-02-18 DIAGNOSIS — E0810 Diabetes mellitus due to underlying condition with ketoacidosis without coma: Secondary | ICD-10-CM | POA: Diagnosis not present

## 2016-02-18 DIAGNOSIS — N186 End stage renal disease: Secondary | ICD-10-CM | POA: Diagnosis not present

## 2016-02-18 DIAGNOSIS — D631 Anemia in chronic kidney disease: Secondary | ICD-10-CM | POA: Diagnosis not present

## 2016-02-18 DIAGNOSIS — N2581 Secondary hyperparathyroidism of renal origin: Secondary | ICD-10-CM | POA: Diagnosis not present

## 2016-02-18 DIAGNOSIS — D509 Iron deficiency anemia, unspecified: Secondary | ICD-10-CM | POA: Diagnosis not present

## 2016-02-20 DIAGNOSIS — N2581 Secondary hyperparathyroidism of renal origin: Secondary | ICD-10-CM | POA: Diagnosis not present

## 2016-02-20 DIAGNOSIS — D631 Anemia in chronic kidney disease: Secondary | ICD-10-CM | POA: Diagnosis not present

## 2016-02-20 DIAGNOSIS — N186 End stage renal disease: Secondary | ICD-10-CM | POA: Diagnosis not present

## 2016-02-20 DIAGNOSIS — E0810 Diabetes mellitus due to underlying condition with ketoacidosis without coma: Secondary | ICD-10-CM | POA: Diagnosis not present

## 2016-02-20 DIAGNOSIS — D509 Iron deficiency anemia, unspecified: Secondary | ICD-10-CM | POA: Diagnosis not present

## 2016-02-22 DIAGNOSIS — N2581 Secondary hyperparathyroidism of renal origin: Secondary | ICD-10-CM | POA: Diagnosis not present

## 2016-02-22 DIAGNOSIS — D631 Anemia in chronic kidney disease: Secondary | ICD-10-CM | POA: Diagnosis not present

## 2016-02-22 DIAGNOSIS — E0810 Diabetes mellitus due to underlying condition with ketoacidosis without coma: Secondary | ICD-10-CM | POA: Diagnosis not present

## 2016-02-22 DIAGNOSIS — D509 Iron deficiency anemia, unspecified: Secondary | ICD-10-CM | POA: Diagnosis not present

## 2016-02-22 DIAGNOSIS — N186 End stage renal disease: Secondary | ICD-10-CM | POA: Diagnosis not present

## 2016-02-25 DIAGNOSIS — D509 Iron deficiency anemia, unspecified: Secondary | ICD-10-CM | POA: Diagnosis not present

## 2016-02-25 DIAGNOSIS — D631 Anemia in chronic kidney disease: Secondary | ICD-10-CM | POA: Diagnosis not present

## 2016-02-25 DIAGNOSIS — N186 End stage renal disease: Secondary | ICD-10-CM | POA: Diagnosis not present

## 2016-02-25 DIAGNOSIS — N2581 Secondary hyperparathyroidism of renal origin: Secondary | ICD-10-CM | POA: Diagnosis not present

## 2016-02-25 DIAGNOSIS — E0810 Diabetes mellitus due to underlying condition with ketoacidosis without coma: Secondary | ICD-10-CM | POA: Diagnosis not present

## 2016-02-26 DIAGNOSIS — N186 End stage renal disease: Secondary | ICD-10-CM | POA: Diagnosis not present

## 2016-02-26 DIAGNOSIS — Z992 Dependence on renal dialysis: Secondary | ICD-10-CM | POA: Diagnosis not present

## 2016-02-26 DIAGNOSIS — I120 Hypertensive chronic kidney disease with stage 5 chronic kidney disease or end stage renal disease: Secondary | ICD-10-CM | POA: Diagnosis not present

## 2016-02-27 DIAGNOSIS — N186 End stage renal disease: Secondary | ICD-10-CM | POA: Diagnosis not present

## 2016-02-27 DIAGNOSIS — E0810 Diabetes mellitus due to underlying condition with ketoacidosis without coma: Secondary | ICD-10-CM | POA: Diagnosis not present

## 2016-02-27 DIAGNOSIS — D509 Iron deficiency anemia, unspecified: Secondary | ICD-10-CM | POA: Diagnosis not present

## 2016-02-27 DIAGNOSIS — E113299 Type 2 diabetes mellitus with mild nonproliferative diabetic retinopathy without macular edema, unspecified eye: Secondary | ICD-10-CM | POA: Diagnosis not present

## 2016-02-27 DIAGNOSIS — D631 Anemia in chronic kidney disease: Secondary | ICD-10-CM | POA: Diagnosis not present

## 2016-02-27 DIAGNOSIS — N2581 Secondary hyperparathyroidism of renal origin: Secondary | ICD-10-CM | POA: Diagnosis not present

## 2016-02-29 DIAGNOSIS — D509 Iron deficiency anemia, unspecified: Secondary | ICD-10-CM | POA: Diagnosis not present

## 2016-02-29 DIAGNOSIS — N186 End stage renal disease: Secondary | ICD-10-CM | POA: Diagnosis not present

## 2016-02-29 DIAGNOSIS — E113299 Type 2 diabetes mellitus with mild nonproliferative diabetic retinopathy without macular edema, unspecified eye: Secondary | ICD-10-CM | POA: Diagnosis not present

## 2016-02-29 DIAGNOSIS — D631 Anemia in chronic kidney disease: Secondary | ICD-10-CM | POA: Diagnosis not present

## 2016-02-29 DIAGNOSIS — E0810 Diabetes mellitus due to underlying condition with ketoacidosis without coma: Secondary | ICD-10-CM | POA: Diagnosis not present

## 2016-02-29 DIAGNOSIS — N2581 Secondary hyperparathyroidism of renal origin: Secondary | ICD-10-CM | POA: Diagnosis not present

## 2016-03-03 DIAGNOSIS — N2581 Secondary hyperparathyroidism of renal origin: Secondary | ICD-10-CM | POA: Diagnosis not present

## 2016-03-03 DIAGNOSIS — E113299 Type 2 diabetes mellitus with mild nonproliferative diabetic retinopathy without macular edema, unspecified eye: Secondary | ICD-10-CM | POA: Diagnosis not present

## 2016-03-03 DIAGNOSIS — E0810 Diabetes mellitus due to underlying condition with ketoacidosis without coma: Secondary | ICD-10-CM | POA: Diagnosis not present

## 2016-03-03 DIAGNOSIS — D509 Iron deficiency anemia, unspecified: Secondary | ICD-10-CM | POA: Diagnosis not present

## 2016-03-03 DIAGNOSIS — D631 Anemia in chronic kidney disease: Secondary | ICD-10-CM | POA: Diagnosis not present

## 2016-03-03 DIAGNOSIS — N186 End stage renal disease: Secondary | ICD-10-CM | POA: Diagnosis not present

## 2016-03-05 DIAGNOSIS — D631 Anemia in chronic kidney disease: Secondary | ICD-10-CM | POA: Diagnosis not present

## 2016-03-05 DIAGNOSIS — E113299 Type 2 diabetes mellitus with mild nonproliferative diabetic retinopathy without macular edema, unspecified eye: Secondary | ICD-10-CM | POA: Diagnosis not present

## 2016-03-05 DIAGNOSIS — N186 End stage renal disease: Secondary | ICD-10-CM | POA: Diagnosis not present

## 2016-03-05 DIAGNOSIS — E0810 Diabetes mellitus due to underlying condition with ketoacidosis without coma: Secondary | ICD-10-CM | POA: Diagnosis not present

## 2016-03-05 DIAGNOSIS — D509 Iron deficiency anemia, unspecified: Secondary | ICD-10-CM | POA: Diagnosis not present

## 2016-03-05 DIAGNOSIS — N2581 Secondary hyperparathyroidism of renal origin: Secondary | ICD-10-CM | POA: Diagnosis not present

## 2016-03-07 DIAGNOSIS — D509 Iron deficiency anemia, unspecified: Secondary | ICD-10-CM | POA: Diagnosis not present

## 2016-03-07 DIAGNOSIS — E113299 Type 2 diabetes mellitus with mild nonproliferative diabetic retinopathy without macular edema, unspecified eye: Secondary | ICD-10-CM | POA: Diagnosis not present

## 2016-03-07 DIAGNOSIS — N2581 Secondary hyperparathyroidism of renal origin: Secondary | ICD-10-CM | POA: Diagnosis not present

## 2016-03-07 DIAGNOSIS — E0810 Diabetes mellitus due to underlying condition with ketoacidosis without coma: Secondary | ICD-10-CM | POA: Diagnosis not present

## 2016-03-07 DIAGNOSIS — N186 End stage renal disease: Secondary | ICD-10-CM | POA: Diagnosis not present

## 2016-03-07 DIAGNOSIS — D631 Anemia in chronic kidney disease: Secondary | ICD-10-CM | POA: Diagnosis not present

## 2016-03-10 DIAGNOSIS — D631 Anemia in chronic kidney disease: Secondary | ICD-10-CM | POA: Diagnosis not present

## 2016-03-10 DIAGNOSIS — N2581 Secondary hyperparathyroidism of renal origin: Secondary | ICD-10-CM | POA: Diagnosis not present

## 2016-03-10 DIAGNOSIS — D509 Iron deficiency anemia, unspecified: Secondary | ICD-10-CM | POA: Diagnosis not present

## 2016-03-10 DIAGNOSIS — E113299 Type 2 diabetes mellitus with mild nonproliferative diabetic retinopathy without macular edema, unspecified eye: Secondary | ICD-10-CM | POA: Diagnosis not present

## 2016-03-10 DIAGNOSIS — E0810 Diabetes mellitus due to underlying condition with ketoacidosis without coma: Secondary | ICD-10-CM | POA: Diagnosis not present

## 2016-03-10 DIAGNOSIS — N186 End stage renal disease: Secondary | ICD-10-CM | POA: Diagnosis not present

## 2016-03-12 DIAGNOSIS — E113299 Type 2 diabetes mellitus with mild nonproliferative diabetic retinopathy without macular edema, unspecified eye: Secondary | ICD-10-CM | POA: Diagnosis not present

## 2016-03-12 DIAGNOSIS — D631 Anemia in chronic kidney disease: Secondary | ICD-10-CM | POA: Diagnosis not present

## 2016-03-12 DIAGNOSIS — N2581 Secondary hyperparathyroidism of renal origin: Secondary | ICD-10-CM | POA: Diagnosis not present

## 2016-03-12 DIAGNOSIS — E0810 Diabetes mellitus due to underlying condition with ketoacidosis without coma: Secondary | ICD-10-CM | POA: Diagnosis not present

## 2016-03-12 DIAGNOSIS — D509 Iron deficiency anemia, unspecified: Secondary | ICD-10-CM | POA: Diagnosis not present

## 2016-03-12 DIAGNOSIS — N186 End stage renal disease: Secondary | ICD-10-CM | POA: Diagnosis not present

## 2016-03-14 DIAGNOSIS — E0810 Diabetes mellitus due to underlying condition with ketoacidosis without coma: Secondary | ICD-10-CM | POA: Diagnosis not present

## 2016-03-14 DIAGNOSIS — D509 Iron deficiency anemia, unspecified: Secondary | ICD-10-CM | POA: Diagnosis not present

## 2016-03-14 DIAGNOSIS — E113299 Type 2 diabetes mellitus with mild nonproliferative diabetic retinopathy without macular edema, unspecified eye: Secondary | ICD-10-CM | POA: Diagnosis not present

## 2016-03-14 DIAGNOSIS — N186 End stage renal disease: Secondary | ICD-10-CM | POA: Diagnosis not present

## 2016-03-14 DIAGNOSIS — D631 Anemia in chronic kidney disease: Secondary | ICD-10-CM | POA: Diagnosis not present

## 2016-03-14 DIAGNOSIS — N2581 Secondary hyperparathyroidism of renal origin: Secondary | ICD-10-CM | POA: Diagnosis not present

## 2016-03-17 DIAGNOSIS — E113299 Type 2 diabetes mellitus with mild nonproliferative diabetic retinopathy without macular edema, unspecified eye: Secondary | ICD-10-CM | POA: Diagnosis not present

## 2016-03-17 DIAGNOSIS — D631 Anemia in chronic kidney disease: Secondary | ICD-10-CM | POA: Diagnosis not present

## 2016-03-17 DIAGNOSIS — E0810 Diabetes mellitus due to underlying condition with ketoacidosis without coma: Secondary | ICD-10-CM | POA: Diagnosis not present

## 2016-03-17 DIAGNOSIS — D509 Iron deficiency anemia, unspecified: Secondary | ICD-10-CM | POA: Diagnosis not present

## 2016-03-17 DIAGNOSIS — N2581 Secondary hyperparathyroidism of renal origin: Secondary | ICD-10-CM | POA: Diagnosis not present

## 2016-03-17 DIAGNOSIS — N186 End stage renal disease: Secondary | ICD-10-CM | POA: Diagnosis not present

## 2016-03-19 DIAGNOSIS — D509 Iron deficiency anemia, unspecified: Secondary | ICD-10-CM | POA: Diagnosis not present

## 2016-03-19 DIAGNOSIS — N186 End stage renal disease: Secondary | ICD-10-CM | POA: Diagnosis not present

## 2016-03-19 DIAGNOSIS — N2581 Secondary hyperparathyroidism of renal origin: Secondary | ICD-10-CM | POA: Diagnosis not present

## 2016-03-19 DIAGNOSIS — E0810 Diabetes mellitus due to underlying condition with ketoacidosis without coma: Secondary | ICD-10-CM | POA: Diagnosis not present

## 2016-03-19 DIAGNOSIS — E113299 Type 2 diabetes mellitus with mild nonproliferative diabetic retinopathy without macular edema, unspecified eye: Secondary | ICD-10-CM | POA: Diagnosis not present

## 2016-03-19 DIAGNOSIS — D631 Anemia in chronic kidney disease: Secondary | ICD-10-CM | POA: Diagnosis not present

## 2016-03-21 DIAGNOSIS — N186 End stage renal disease: Secondary | ICD-10-CM | POA: Diagnosis not present

## 2016-03-21 DIAGNOSIS — E0810 Diabetes mellitus due to underlying condition with ketoacidosis without coma: Secondary | ICD-10-CM | POA: Diagnosis not present

## 2016-03-21 DIAGNOSIS — D509 Iron deficiency anemia, unspecified: Secondary | ICD-10-CM | POA: Diagnosis not present

## 2016-03-21 DIAGNOSIS — D631 Anemia in chronic kidney disease: Secondary | ICD-10-CM | POA: Diagnosis not present

## 2016-03-21 DIAGNOSIS — N2581 Secondary hyperparathyroidism of renal origin: Secondary | ICD-10-CM | POA: Diagnosis not present

## 2016-03-21 DIAGNOSIS — E113299 Type 2 diabetes mellitus with mild nonproliferative diabetic retinopathy without macular edema, unspecified eye: Secondary | ICD-10-CM | POA: Diagnosis not present

## 2016-03-24 DIAGNOSIS — E113299 Type 2 diabetes mellitus with mild nonproliferative diabetic retinopathy without macular edema, unspecified eye: Secondary | ICD-10-CM | POA: Diagnosis not present

## 2016-03-24 DIAGNOSIS — D631 Anemia in chronic kidney disease: Secondary | ICD-10-CM | POA: Diagnosis not present

## 2016-03-24 DIAGNOSIS — E0810 Diabetes mellitus due to underlying condition with ketoacidosis without coma: Secondary | ICD-10-CM | POA: Diagnosis not present

## 2016-03-24 DIAGNOSIS — N2581 Secondary hyperparathyroidism of renal origin: Secondary | ICD-10-CM | POA: Diagnosis not present

## 2016-03-24 DIAGNOSIS — D509 Iron deficiency anemia, unspecified: Secondary | ICD-10-CM | POA: Diagnosis not present

## 2016-03-24 DIAGNOSIS — N186 End stage renal disease: Secondary | ICD-10-CM | POA: Diagnosis not present

## 2016-03-26 DIAGNOSIS — D631 Anemia in chronic kidney disease: Secondary | ICD-10-CM | POA: Diagnosis not present

## 2016-03-26 DIAGNOSIS — E0810 Diabetes mellitus due to underlying condition with ketoacidosis without coma: Secondary | ICD-10-CM | POA: Diagnosis not present

## 2016-03-26 DIAGNOSIS — D509 Iron deficiency anemia, unspecified: Secondary | ICD-10-CM | POA: Diagnosis not present

## 2016-03-26 DIAGNOSIS — E113299 Type 2 diabetes mellitus with mild nonproliferative diabetic retinopathy without macular edema, unspecified eye: Secondary | ICD-10-CM | POA: Diagnosis not present

## 2016-03-26 DIAGNOSIS — N186 End stage renal disease: Secondary | ICD-10-CM | POA: Diagnosis not present

## 2016-03-26 DIAGNOSIS — N2581 Secondary hyperparathyroidism of renal origin: Secondary | ICD-10-CM | POA: Diagnosis not present

## 2016-03-27 DIAGNOSIS — I120 Hypertensive chronic kidney disease with stage 5 chronic kidney disease or end stage renal disease: Secondary | ICD-10-CM | POA: Diagnosis not present

## 2016-03-27 DIAGNOSIS — Z992 Dependence on renal dialysis: Secondary | ICD-10-CM | POA: Diagnosis not present

## 2016-03-27 DIAGNOSIS — N186 End stage renal disease: Secondary | ICD-10-CM | POA: Diagnosis not present

## 2016-03-28 DIAGNOSIS — N2581 Secondary hyperparathyroidism of renal origin: Secondary | ICD-10-CM | POA: Diagnosis not present

## 2016-03-28 DIAGNOSIS — D631 Anemia in chronic kidney disease: Secondary | ICD-10-CM | POA: Diagnosis not present

## 2016-03-28 DIAGNOSIS — N186 End stage renal disease: Secondary | ICD-10-CM | POA: Diagnosis not present

## 2016-03-28 DIAGNOSIS — E1129 Type 2 diabetes mellitus with other diabetic kidney complication: Secondary | ICD-10-CM | POA: Diagnosis not present

## 2016-03-28 DIAGNOSIS — E113299 Type 2 diabetes mellitus with mild nonproliferative diabetic retinopathy without macular edema, unspecified eye: Secondary | ICD-10-CM | POA: Diagnosis not present

## 2016-03-28 DIAGNOSIS — E0810 Diabetes mellitus due to underlying condition with ketoacidosis without coma: Secondary | ICD-10-CM | POA: Diagnosis not present

## 2016-03-28 DIAGNOSIS — D509 Iron deficiency anemia, unspecified: Secondary | ICD-10-CM | POA: Diagnosis not present

## 2016-03-31 DIAGNOSIS — E113299 Type 2 diabetes mellitus with mild nonproliferative diabetic retinopathy without macular edema, unspecified eye: Secondary | ICD-10-CM | POA: Diagnosis not present

## 2016-03-31 DIAGNOSIS — N186 End stage renal disease: Secondary | ICD-10-CM | POA: Diagnosis not present

## 2016-03-31 DIAGNOSIS — E0810 Diabetes mellitus due to underlying condition with ketoacidosis without coma: Secondary | ICD-10-CM | POA: Diagnosis not present

## 2016-03-31 DIAGNOSIS — N2581 Secondary hyperparathyroidism of renal origin: Secondary | ICD-10-CM | POA: Diagnosis not present

## 2016-03-31 DIAGNOSIS — D509 Iron deficiency anemia, unspecified: Secondary | ICD-10-CM | POA: Diagnosis not present

## 2016-03-31 DIAGNOSIS — E1129 Type 2 diabetes mellitus with other diabetic kidney complication: Secondary | ICD-10-CM | POA: Diagnosis not present

## 2016-04-02 DIAGNOSIS — N186 End stage renal disease: Secondary | ICD-10-CM | POA: Diagnosis not present

## 2016-04-02 DIAGNOSIS — N2581 Secondary hyperparathyroidism of renal origin: Secondary | ICD-10-CM | POA: Diagnosis not present

## 2016-04-02 DIAGNOSIS — D509 Iron deficiency anemia, unspecified: Secondary | ICD-10-CM | POA: Diagnosis not present

## 2016-04-02 DIAGNOSIS — E1129 Type 2 diabetes mellitus with other diabetic kidney complication: Secondary | ICD-10-CM | POA: Diagnosis not present

## 2016-04-02 DIAGNOSIS — E113299 Type 2 diabetes mellitus with mild nonproliferative diabetic retinopathy without macular edema, unspecified eye: Secondary | ICD-10-CM | POA: Diagnosis not present

## 2016-04-02 DIAGNOSIS — E0810 Diabetes mellitus due to underlying condition with ketoacidosis without coma: Secondary | ICD-10-CM | POA: Diagnosis not present

## 2016-04-04 DIAGNOSIS — E113299 Type 2 diabetes mellitus with mild nonproliferative diabetic retinopathy without macular edema, unspecified eye: Secondary | ICD-10-CM | POA: Diagnosis not present

## 2016-04-04 DIAGNOSIS — D509 Iron deficiency anemia, unspecified: Secondary | ICD-10-CM | POA: Diagnosis not present

## 2016-04-04 DIAGNOSIS — E1129 Type 2 diabetes mellitus with other diabetic kidney complication: Secondary | ICD-10-CM | POA: Diagnosis not present

## 2016-04-04 DIAGNOSIS — E0810 Diabetes mellitus due to underlying condition with ketoacidosis without coma: Secondary | ICD-10-CM | POA: Diagnosis not present

## 2016-04-04 DIAGNOSIS — N186 End stage renal disease: Secondary | ICD-10-CM | POA: Diagnosis not present

## 2016-04-04 DIAGNOSIS — N2581 Secondary hyperparathyroidism of renal origin: Secondary | ICD-10-CM | POA: Diagnosis not present

## 2016-04-07 DIAGNOSIS — E113299 Type 2 diabetes mellitus with mild nonproliferative diabetic retinopathy without macular edema, unspecified eye: Secondary | ICD-10-CM | POA: Diagnosis not present

## 2016-04-07 DIAGNOSIS — N2581 Secondary hyperparathyroidism of renal origin: Secondary | ICD-10-CM | POA: Diagnosis not present

## 2016-04-07 DIAGNOSIS — D509 Iron deficiency anemia, unspecified: Secondary | ICD-10-CM | POA: Diagnosis not present

## 2016-04-07 DIAGNOSIS — N186 End stage renal disease: Secondary | ICD-10-CM | POA: Diagnosis not present

## 2016-04-07 DIAGNOSIS — E0810 Diabetes mellitus due to underlying condition with ketoacidosis without coma: Secondary | ICD-10-CM | POA: Diagnosis not present

## 2016-04-07 DIAGNOSIS — E1129 Type 2 diabetes mellitus with other diabetic kidney complication: Secondary | ICD-10-CM | POA: Diagnosis not present

## 2016-04-09 DIAGNOSIS — E113299 Type 2 diabetes mellitus with mild nonproliferative diabetic retinopathy without macular edema, unspecified eye: Secondary | ICD-10-CM | POA: Diagnosis not present

## 2016-04-09 DIAGNOSIS — E1129 Type 2 diabetes mellitus with other diabetic kidney complication: Secondary | ICD-10-CM | POA: Diagnosis not present

## 2016-04-09 DIAGNOSIS — D509 Iron deficiency anemia, unspecified: Secondary | ICD-10-CM | POA: Diagnosis not present

## 2016-04-09 DIAGNOSIS — N2581 Secondary hyperparathyroidism of renal origin: Secondary | ICD-10-CM | POA: Diagnosis not present

## 2016-04-09 DIAGNOSIS — N186 End stage renal disease: Secondary | ICD-10-CM | POA: Diagnosis not present

## 2016-04-09 DIAGNOSIS — E0810 Diabetes mellitus due to underlying condition with ketoacidosis without coma: Secondary | ICD-10-CM | POA: Diagnosis not present

## 2016-04-11 DIAGNOSIS — N2581 Secondary hyperparathyroidism of renal origin: Secondary | ICD-10-CM | POA: Diagnosis not present

## 2016-04-11 DIAGNOSIS — D509 Iron deficiency anemia, unspecified: Secondary | ICD-10-CM | POA: Diagnosis not present

## 2016-04-11 DIAGNOSIS — E113299 Type 2 diabetes mellitus with mild nonproliferative diabetic retinopathy without macular edema, unspecified eye: Secondary | ICD-10-CM | POA: Diagnosis not present

## 2016-04-11 DIAGNOSIS — N186 End stage renal disease: Secondary | ICD-10-CM | POA: Diagnosis not present

## 2016-04-11 DIAGNOSIS — E1129 Type 2 diabetes mellitus with other diabetic kidney complication: Secondary | ICD-10-CM | POA: Diagnosis not present

## 2016-04-11 DIAGNOSIS — E0810 Diabetes mellitus due to underlying condition with ketoacidosis without coma: Secondary | ICD-10-CM | POA: Diagnosis not present

## 2016-04-12 ENCOUNTER — Emergency Department
Admission: EM | Admit: 2016-04-12 | Discharge: 2016-04-12 | Disposition: A | Discharge status: Home / Self Care | Payer: Medicare Other | Attending: Emergency Medicine | Admitting: Emergency Medicine

## 2016-04-12 ENCOUNTER — Encounter: Payer: Self-pay | Admitting: Emergency Medicine

## 2016-04-12 DIAGNOSIS — Z79899 Other long term (current) drug therapy: Secondary | ICD-10-CM | POA: Diagnosis not present

## 2016-04-12 DIAGNOSIS — Z992 Dependence on renal dialysis: Secondary | ICD-10-CM | POA: Insufficient documentation

## 2016-04-12 DIAGNOSIS — N186 End stage renal disease: Secondary | ICD-10-CM | POA: Insufficient documentation

## 2016-04-12 DIAGNOSIS — I120 Hypertensive chronic kidney disease with stage 5 chronic kidney disease or end stage renal disease: Secondary | ICD-10-CM | POA: Insufficient documentation

## 2016-04-12 DIAGNOSIS — E1122 Type 2 diabetes mellitus with diabetic chronic kidney disease: Secondary | ICD-10-CM | POA: Insufficient documentation

## 2016-04-12 DIAGNOSIS — Z794 Long term (current) use of insulin: Secondary | ICD-10-CM | POA: Diagnosis not present

## 2016-04-12 DIAGNOSIS — R21 Rash and other nonspecific skin eruption: Secondary | ICD-10-CM | POA: Insufficient documentation

## 2016-04-12 HISTORY — DX: Dependence on renal dialysis: Z99.2

## 2016-04-12 NOTE — ED Provider Notes (Signed)
[facility] Emergency Department Provider Note   ____________________________________________  Time seen: Approximately 1:07 PM  I have reviewed the triage vital signs and the nursing notes.   HISTORY  Chief Complaint Abscess   HPI [name] is a 35 y.o. male is here with complaint of "bump" to his forehead for several weeks.Patient states he went to the pharmacy and picked up some over-the-counter cleaners to clean his face but states that the bump has increased in size. Patient is insulin-dependent diabetic and is concerned. He denies any nausea, vomiting or fever. He denies any redness to the area and is nontender. He states he is mostly concerned because he does not like to look at it. He rates his discomfort as an 8/10.  He also states he does not like the way it looks.   Past Medical History  Diagnosis Date  . Diabetes mellitus without complication ([place])   . Hypertension   . Renal disorder     Dialysis  . Dialysis patient [facility])     Patient Active Problem List   Diagnosis Date Noted  . DKA (diabetic ketoacidoses) ([place]) 05/27/2015    Past Surgical History  Procedure Laterality Date  . Insertion of dialysis catheter      Right subclavian  . Av fistula placement      left arm.    Current Outpatient Rx  Name  Route  Sig  Dispense  Refill  . amLODipine (NORVASC) 10 MG tablet   Oral   Take 10 mg by mouth daily.         . carvedilol (COREG) 25 MG tablet   Oral   Take 25 mg by mouth 2 (two) times daily with a meal.         . cloNIDine (CATAPRES) 0.3 MG tablet   Oral   Take 0.3 mg by mouth 2 (two) times daily.         Marland Kitchen HYDROcodone-acetaminophen (NORCO/VICODIN) 5-325 MG per tablet   Oral   Take 1 tablet by mouth every 6 (six) hours as needed for moderate pain.   20 tablet   0   . insulin glargine (LANTUS) 100 UNIT/ML injection   Subcutaneous   Inject 0.12 mLs (12 Units total) into the skin at bedtime.   10 mL   11     . insulin lispro (HUMALOG) 100 UNIT/ML injection   Subcutaneous   Inject 8 Units into the skin 3 (three) times daily before meals.         . metoCLOPramide (REGLAN) 5 MG tablet   Oral   Take 5 mg by mouth 3 (three) times daily before meals.         . minoxidil (LONITEN) 10 MG tablet   Oral   Take 10 mg by mouth daily.         . pantoprazole (PROTONIX) 40 MG tablet   Oral   Take 1 tablet (40 mg total) by mouth 2 (two) times daily.   60 tablet   0   . spironolactone (ALDACTONE) 25 MG tablet   Oral   Take 25 mg by mouth daily.         . valsartan (DIOVAN) 160 MG tablet   Oral   Take 160 mg by mouth 2 (two) times daily.           Allergies Review of patient's allergies indicates no known allergies.  Family History  Problem Relation Age of Onset  . Diabetes Mellitus II  Social History Social History  Substance Use Topics  . Smoking status: Never Smoker   . Smokeless tobacco: None  . Alcohol Use: No    Review of Systems Constitutional: No fever/chills Eyes: No visual changes. Cardiovascular: Denies chest pain. Respiratory: Denies shortness of breath. Skin: Positive for skin eruption. Neurological: Negative for headaches, focal weakness or numbness.  10-point ROS otherwise negative.  ____________________________________________   PHYSICAL EXAM:  VITAL SIGNS: ED Triage Vitals  Enc Vitals Group     BP 04/12/16 1228 161/105 mmHg     Pulse Rate 04/12/16 1228 86     Resp 04/12/16 1228 14     Temp 04/12/16 1228 98.1 F (36.7 C)     Temp Source 04/12/16 1228 Oral     SpO2 04/12/16 1228 98 %     Weight 04/12/16 1228 194 lb (87.998 kg)     Height 04/12/16 1228 5\' 6"  (1.676 m)     Head Cir --      Peak Flow --      Pain Score 04/12/16 1229 8     Pain Loc --      Pain Edu? --      Excl. in [place]? --     Constitutional: Alert and oriented. Well appearing and in no acute distress. Eyes: Conjunctivae are normal. PERRL. EOMI. Head:  Atraumatic. Nose: No congestion/rhinnorhea. Mouth/Throat: Mucous membranes are moist.  Oropharynx non-erythematous. Neck: No stridor.   Hematological/Lymphatic/Immunilogical: No cervical lymphadenopathy. Cardiovascular: Normal rate, regular rhythm. Grossly normal heart sounds.  Good peripheral circulation. Respiratory: Normal respiratory effort.  No retractions. Lungs CTAB. Musculoskeletal: No lower extremity tenderness nor edema.  No joint effusions. Neurologic:  Normal speech and language. No gross focal neurologic deficits are appreciated. No gait instability. Skin:  Skin is warm, dry and intact. There is a single less than 5 cm non-pigmented, non-erythematous nontender skin lesion on the forehead left of the midline. Area is firm without fluctuance. There is no evidence of surrounding cellulitis. Edges are discrete. Psychiatric: Mood and affect are normal. Speech and behavior are normal.  ____________________________________________   LABS (all labs ordered are listed, but only abnormal results are displayed)  Labs Reviewed - No data to display  PROCEDURES  Procedure(s) performed: None  Procedures  Critical Care performed: No  ____________________________________________   INITIAL IMPRESSION / ASSESSMENT AND PLAN / ED COURSE  Pertinent labs & imaging results that were available during my care of the patient were reviewed by me and considered in my medical decision making (see chart for details).  Patient was told that this nodule is not an infected abscess and at this time incision and drainage is not needed. Patient does not need to be on any antibiotics at this time. He is to follow-up with his primary care doctor at [facility] however he does not remember the doctor's name. He is also given the name of a dermatology office, [facility] if removal of this lesion is desired. ____________________________________________   FINAL CLINICAL IMPRESSION(S) / ED  DIAGNOSES  Final diagnoses:  Rash/skin eruption  forehead    NEW MEDICATIONS STARTED DURING THIS VISIT:  Discharge Medication List as of 04/12/2016  1:35 PM       Note:  This document was prepared using Dragon voice recognition software and may include unintentional dictation errors.    Johnn Hai, PA-C 04/12/16 1418  Johnn Hai, PA-C 04/12/16 1418  Rudene Re, MD 04/12/16 2056

## 2016-04-12 NOTE — ED Notes (Signed)
Bump to forehead has been there for several weeks. Pt thought it was a pimple and tried OTC cleaners but pt states the bump as increased in size and diameter. Pt does have ESRD and DM.

## 2016-04-12 NOTE — Discharge Instructions (Signed)
You need to follow up with your doctor at [facility] clinic if any continued problems. You may also call and make an appointment with [place] if excision is desired.

## 2016-04-12 NOTE — ED Notes (Signed)
Has bump on forehead for 3 weeks getting bigger.

## 2016-04-14 DIAGNOSIS — E113299 Type 2 diabetes mellitus with mild nonproliferative diabetic retinopathy without macular edema, unspecified eye: Secondary | ICD-10-CM | POA: Diagnosis not present

## 2016-04-14 DIAGNOSIS — D509 Iron deficiency anemia, unspecified: Secondary | ICD-10-CM | POA: Diagnosis not present

## 2016-04-14 DIAGNOSIS — N2581 Secondary hyperparathyroidism of renal origin: Secondary | ICD-10-CM | POA: Diagnosis not present

## 2016-04-14 DIAGNOSIS — E0810 Diabetes mellitus due to underlying condition with ketoacidosis without coma: Secondary | ICD-10-CM | POA: Diagnosis not present

## 2016-04-14 DIAGNOSIS — E1129 Type 2 diabetes mellitus with other diabetic kidney complication: Secondary | ICD-10-CM | POA: Diagnosis not present

## 2016-04-14 DIAGNOSIS — N186 End stage renal disease: Secondary | ICD-10-CM | POA: Diagnosis not present

## 2016-04-15 DIAGNOSIS — L720 Epidermal cyst: Secondary | ICD-10-CM | POA: Diagnosis not present

## 2016-04-15 DIAGNOSIS — R208 Other disturbances of skin sensation: Secondary | ICD-10-CM | POA: Diagnosis not present

## 2016-04-16 DIAGNOSIS — N186 End stage renal disease: Secondary | ICD-10-CM | POA: Diagnosis not present

## 2016-04-16 DIAGNOSIS — N2581 Secondary hyperparathyroidism of renal origin: Secondary | ICD-10-CM | POA: Diagnosis not present

## 2016-04-16 DIAGNOSIS — E0810 Diabetes mellitus due to underlying condition with ketoacidosis without coma: Secondary | ICD-10-CM | POA: Diagnosis not present

## 2016-04-16 DIAGNOSIS — E113299 Type 2 diabetes mellitus with mild nonproliferative diabetic retinopathy without macular edema, unspecified eye: Secondary | ICD-10-CM | POA: Diagnosis not present

## 2016-04-16 DIAGNOSIS — E1129 Type 2 diabetes mellitus with other diabetic kidney complication: Secondary | ICD-10-CM | POA: Diagnosis not present

## 2016-04-16 DIAGNOSIS — D509 Iron deficiency anemia, unspecified: Secondary | ICD-10-CM | POA: Diagnosis not present

## 2016-04-18 DIAGNOSIS — E1129 Type 2 diabetes mellitus with other diabetic kidney complication: Secondary | ICD-10-CM | POA: Diagnosis not present

## 2016-04-18 DIAGNOSIS — D509 Iron deficiency anemia, unspecified: Secondary | ICD-10-CM | POA: Diagnosis not present

## 2016-04-18 DIAGNOSIS — E113299 Type 2 diabetes mellitus with mild nonproliferative diabetic retinopathy without macular edema, unspecified eye: Secondary | ICD-10-CM | POA: Diagnosis not present

## 2016-04-18 DIAGNOSIS — N186 End stage renal disease: Secondary | ICD-10-CM | POA: Diagnosis not present

## 2016-04-18 DIAGNOSIS — E0810 Diabetes mellitus due to underlying condition with ketoacidosis without coma: Secondary | ICD-10-CM | POA: Diagnosis not present

## 2016-04-18 DIAGNOSIS — N2581 Secondary hyperparathyroidism of renal origin: Secondary | ICD-10-CM | POA: Diagnosis not present

## 2016-04-21 DIAGNOSIS — E11649 Type 2 diabetes mellitus with hypoglycemia without coma: Secondary | ICD-10-CM | POA: Diagnosis not present

## 2016-04-21 DIAGNOSIS — Z992 Dependence on renal dialysis: Secondary | ICD-10-CM | POA: Diagnosis not present

## 2016-04-21 DIAGNOSIS — I2510 Atherosclerotic heart disease of native coronary artery without angina pectoris: Secondary | ICD-10-CM | POA: Diagnosis not present

## 2016-04-21 DIAGNOSIS — N2581 Secondary hyperparathyroidism of renal origin: Secondary | ICD-10-CM | POA: Diagnosis not present

## 2016-04-21 DIAGNOSIS — Z794 Long term (current) use of insulin: Secondary | ICD-10-CM | POA: Diagnosis not present

## 2016-04-21 DIAGNOSIS — J984 Other disorders of lung: Secondary | ICD-10-CM | POA: Diagnosis not present

## 2016-04-21 DIAGNOSIS — N186 End stage renal disease: Secondary | ICD-10-CM | POA: Diagnosis not present

## 2016-04-21 DIAGNOSIS — Z8249 Family history of ischemic heart disease and other diseases of the circulatory system: Secondary | ICD-10-CM | POA: Diagnosis not present

## 2016-04-21 DIAGNOSIS — R0600 Dyspnea, unspecified: Secondary | ICD-10-CM | POA: Diagnosis not present

## 2016-04-21 DIAGNOSIS — D509 Iron deficiency anemia, unspecified: Secondary | ICD-10-CM | POA: Diagnosis not present

## 2016-04-21 DIAGNOSIS — I120 Hypertensive chronic kidney disease with stage 5 chronic kidney disease or end stage renal disease: Secondary | ICD-10-CM | POA: Diagnosis not present

## 2016-04-21 DIAGNOSIS — E1129 Type 2 diabetes mellitus with other diabetic kidney complication: Secondary | ICD-10-CM | POA: Diagnosis not present

## 2016-04-21 DIAGNOSIS — E1122 Type 2 diabetes mellitus with diabetic chronic kidney disease: Secondary | ICD-10-CM | POA: Diagnosis not present

## 2016-04-21 DIAGNOSIS — E113299 Type 2 diabetes mellitus with mild nonproliferative diabetic retinopathy without macular edema, unspecified eye: Secondary | ICD-10-CM | POA: Diagnosis not present

## 2016-04-21 DIAGNOSIS — J189 Pneumonia, unspecified organism: Secondary | ICD-10-CM | POA: Diagnosis not present

## 2016-04-21 DIAGNOSIS — E0810 Diabetes mellitus due to underlying condition with ketoacidosis without coma: Secondary | ICD-10-CM | POA: Diagnosis not present

## 2016-04-23 DIAGNOSIS — N186 End stage renal disease: Secondary | ICD-10-CM | POA: Diagnosis not present

## 2016-04-23 DIAGNOSIS — E113299 Type 2 diabetes mellitus with mild nonproliferative diabetic retinopathy without macular edema, unspecified eye: Secondary | ICD-10-CM | POA: Diagnosis not present

## 2016-04-23 DIAGNOSIS — E0810 Diabetes mellitus due to underlying condition with ketoacidosis without coma: Secondary | ICD-10-CM | POA: Diagnosis not present

## 2016-04-23 DIAGNOSIS — D509 Iron deficiency anemia, unspecified: Secondary | ICD-10-CM | POA: Diagnosis not present

## 2016-04-23 DIAGNOSIS — E1129 Type 2 diabetes mellitus with other diabetic kidney complication: Secondary | ICD-10-CM | POA: Diagnosis not present

## 2016-04-23 DIAGNOSIS — N2581 Secondary hyperparathyroidism of renal origin: Secondary | ICD-10-CM | POA: Diagnosis not present

## 2016-04-25 DIAGNOSIS — N2581 Secondary hyperparathyroidism of renal origin: Secondary | ICD-10-CM | POA: Diagnosis not present

## 2016-04-25 DIAGNOSIS — N186 End stage renal disease: Secondary | ICD-10-CM | POA: Diagnosis not present

## 2016-04-25 DIAGNOSIS — E0810 Diabetes mellitus due to underlying condition with ketoacidosis without coma: Secondary | ICD-10-CM | POA: Diagnosis not present

## 2016-04-25 DIAGNOSIS — E1129 Type 2 diabetes mellitus with other diabetic kidney complication: Secondary | ICD-10-CM | POA: Diagnosis not present

## 2016-04-25 DIAGNOSIS — E113299 Type 2 diabetes mellitus with mild nonproliferative diabetic retinopathy without macular edema, unspecified eye: Secondary | ICD-10-CM | POA: Diagnosis not present

## 2016-04-25 DIAGNOSIS — D509 Iron deficiency anemia, unspecified: Secondary | ICD-10-CM | POA: Diagnosis not present

## 2016-04-27 DIAGNOSIS — Z992 Dependence on renal dialysis: Secondary | ICD-10-CM | POA: Diagnosis not present

## 2016-04-27 DIAGNOSIS — L720 Epidermal cyst: Secondary | ICD-10-CM | POA: Diagnosis not present

## 2016-04-27 DIAGNOSIS — I120 Hypertensive chronic kidney disease with stage 5 chronic kidney disease or end stage renal disease: Secondary | ICD-10-CM | POA: Diagnosis not present

## 2016-04-27 DIAGNOSIS — N186 End stage renal disease: Secondary | ICD-10-CM | POA: Diagnosis not present

## 2016-04-28 DIAGNOSIS — D631 Anemia in chronic kidney disease: Secondary | ICD-10-CM | POA: Diagnosis not present

## 2016-04-28 DIAGNOSIS — D509 Iron deficiency anemia, unspecified: Secondary | ICD-10-CM | POA: Diagnosis not present

## 2016-04-28 DIAGNOSIS — N2581 Secondary hyperparathyroidism of renal origin: Secondary | ICD-10-CM | POA: Diagnosis not present

## 2016-04-28 DIAGNOSIS — N186 End stage renal disease: Secondary | ICD-10-CM | POA: Diagnosis not present

## 2016-04-28 DIAGNOSIS — E0810 Diabetes mellitus due to underlying condition with ketoacidosis without coma: Secondary | ICD-10-CM | POA: Diagnosis not present

## 2016-04-30 DIAGNOSIS — N186 End stage renal disease: Secondary | ICD-10-CM | POA: Diagnosis not present

## 2016-04-30 DIAGNOSIS — E0810 Diabetes mellitus due to underlying condition with ketoacidosis without coma: Secondary | ICD-10-CM | POA: Diagnosis not present

## 2016-04-30 DIAGNOSIS — D631 Anemia in chronic kidney disease: Secondary | ICD-10-CM | POA: Diagnosis not present

## 2016-04-30 DIAGNOSIS — N2581 Secondary hyperparathyroidism of renal origin: Secondary | ICD-10-CM | POA: Diagnosis not present

## 2016-04-30 DIAGNOSIS — D509 Iron deficiency anemia, unspecified: Secondary | ICD-10-CM | POA: Diagnosis not present

## 2016-05-02 DIAGNOSIS — E0810 Diabetes mellitus due to underlying condition with ketoacidosis without coma: Secondary | ICD-10-CM | POA: Diagnosis not present

## 2016-05-02 DIAGNOSIS — N186 End stage renal disease: Secondary | ICD-10-CM | POA: Diagnosis not present

## 2016-05-02 DIAGNOSIS — N2581 Secondary hyperparathyroidism of renal origin: Secondary | ICD-10-CM | POA: Diagnosis not present

## 2016-05-02 DIAGNOSIS — D509 Iron deficiency anemia, unspecified: Secondary | ICD-10-CM | POA: Diagnosis not present

## 2016-05-02 DIAGNOSIS — D631 Anemia in chronic kidney disease: Secondary | ICD-10-CM | POA: Diagnosis not present

## 2016-05-05 DIAGNOSIS — E0810 Diabetes mellitus due to underlying condition with ketoacidosis without coma: Secondary | ICD-10-CM | POA: Diagnosis not present

## 2016-05-05 DIAGNOSIS — D631 Anemia in chronic kidney disease: Secondary | ICD-10-CM | POA: Diagnosis not present

## 2016-05-05 DIAGNOSIS — D509 Iron deficiency anemia, unspecified: Secondary | ICD-10-CM | POA: Diagnosis not present

## 2016-05-05 DIAGNOSIS — N186 End stage renal disease: Secondary | ICD-10-CM | POA: Diagnosis not present

## 2016-05-05 DIAGNOSIS — N2581 Secondary hyperparathyroidism of renal origin: Secondary | ICD-10-CM | POA: Diagnosis not present

## 2016-05-07 DIAGNOSIS — E0810 Diabetes mellitus due to underlying condition with ketoacidosis without coma: Secondary | ICD-10-CM | POA: Diagnosis not present

## 2016-05-07 DIAGNOSIS — D631 Anemia in chronic kidney disease: Secondary | ICD-10-CM | POA: Diagnosis not present

## 2016-05-07 DIAGNOSIS — D509 Iron deficiency anemia, unspecified: Secondary | ICD-10-CM | POA: Diagnosis not present

## 2016-05-07 DIAGNOSIS — N2581 Secondary hyperparathyroidism of renal origin: Secondary | ICD-10-CM | POA: Diagnosis not present

## 2016-05-07 DIAGNOSIS — N186 End stage renal disease: Secondary | ICD-10-CM | POA: Diagnosis not present

## 2016-05-09 DIAGNOSIS — N186 End stage renal disease: Secondary | ICD-10-CM | POA: Diagnosis not present

## 2016-05-09 DIAGNOSIS — N2581 Secondary hyperparathyroidism of renal origin: Secondary | ICD-10-CM | POA: Diagnosis not present

## 2016-05-09 DIAGNOSIS — D509 Iron deficiency anemia, unspecified: Secondary | ICD-10-CM | POA: Diagnosis not present

## 2016-05-09 DIAGNOSIS — D631 Anemia in chronic kidney disease: Secondary | ICD-10-CM | POA: Diagnosis not present

## 2016-05-09 DIAGNOSIS — E0810 Diabetes mellitus due to underlying condition with ketoacidosis without coma: Secondary | ICD-10-CM | POA: Diagnosis not present

## 2016-05-12 DIAGNOSIS — N2581 Secondary hyperparathyroidism of renal origin: Secondary | ICD-10-CM | POA: Diagnosis not present

## 2016-05-12 DIAGNOSIS — D631 Anemia in chronic kidney disease: Secondary | ICD-10-CM | POA: Diagnosis not present

## 2016-05-12 DIAGNOSIS — D509 Iron deficiency anemia, unspecified: Secondary | ICD-10-CM | POA: Diagnosis not present

## 2016-05-12 DIAGNOSIS — N186 End stage renal disease: Secondary | ICD-10-CM | POA: Diagnosis not present

## 2016-05-12 DIAGNOSIS — E0810 Diabetes mellitus due to underlying condition with ketoacidosis without coma: Secondary | ICD-10-CM | POA: Diagnosis not present

## 2016-05-14 DIAGNOSIS — E0810 Diabetes mellitus due to underlying condition with ketoacidosis without coma: Secondary | ICD-10-CM | POA: Diagnosis not present

## 2016-05-14 DIAGNOSIS — N2581 Secondary hyperparathyroidism of renal origin: Secondary | ICD-10-CM | POA: Diagnosis not present

## 2016-05-14 DIAGNOSIS — D631 Anemia in chronic kidney disease: Secondary | ICD-10-CM | POA: Diagnosis not present

## 2016-05-14 DIAGNOSIS — N186 End stage renal disease: Secondary | ICD-10-CM | POA: Diagnosis not present

## 2016-05-14 DIAGNOSIS — D509 Iron deficiency anemia, unspecified: Secondary | ICD-10-CM | POA: Diagnosis not present

## 2016-05-16 DIAGNOSIS — E0810 Diabetes mellitus due to underlying condition with ketoacidosis without coma: Secondary | ICD-10-CM | POA: Diagnosis not present

## 2016-05-16 DIAGNOSIS — D509 Iron deficiency anemia, unspecified: Secondary | ICD-10-CM | POA: Diagnosis not present

## 2016-05-16 DIAGNOSIS — N2581 Secondary hyperparathyroidism of renal origin: Secondary | ICD-10-CM | POA: Diagnosis not present

## 2016-05-16 DIAGNOSIS — N186 End stage renal disease: Secondary | ICD-10-CM | POA: Diagnosis not present

## 2016-05-16 DIAGNOSIS — D631 Anemia in chronic kidney disease: Secondary | ICD-10-CM | POA: Diagnosis not present

## 2016-05-19 DIAGNOSIS — N186 End stage renal disease: Secondary | ICD-10-CM | POA: Diagnosis not present

## 2016-05-19 DIAGNOSIS — N2581 Secondary hyperparathyroidism of renal origin: Secondary | ICD-10-CM | POA: Diagnosis not present

## 2016-05-19 DIAGNOSIS — D631 Anemia in chronic kidney disease: Secondary | ICD-10-CM | POA: Diagnosis not present

## 2016-05-19 DIAGNOSIS — E0810 Diabetes mellitus due to underlying condition with ketoacidosis without coma: Secondary | ICD-10-CM | POA: Diagnosis not present

## 2016-05-19 DIAGNOSIS — D509 Iron deficiency anemia, unspecified: Secondary | ICD-10-CM | POA: Diagnosis not present

## 2016-05-21 DIAGNOSIS — E0810 Diabetes mellitus due to underlying condition with ketoacidosis without coma: Secondary | ICD-10-CM | POA: Diagnosis not present

## 2016-05-21 DIAGNOSIS — N2581 Secondary hyperparathyroidism of renal origin: Secondary | ICD-10-CM | POA: Diagnosis not present

## 2016-05-21 DIAGNOSIS — N186 End stage renal disease: Secondary | ICD-10-CM | POA: Diagnosis not present

## 2016-05-21 DIAGNOSIS — D509 Iron deficiency anemia, unspecified: Secondary | ICD-10-CM | POA: Diagnosis not present

## 2016-05-21 DIAGNOSIS — D631 Anemia in chronic kidney disease: Secondary | ICD-10-CM | POA: Diagnosis not present

## 2016-05-23 DIAGNOSIS — D509 Iron deficiency anemia, unspecified: Secondary | ICD-10-CM | POA: Diagnosis not present

## 2016-05-23 DIAGNOSIS — D631 Anemia in chronic kidney disease: Secondary | ICD-10-CM | POA: Diagnosis not present

## 2016-05-23 DIAGNOSIS — E0810 Diabetes mellitus due to underlying condition with ketoacidosis without coma: Secondary | ICD-10-CM | POA: Diagnosis not present

## 2016-05-23 DIAGNOSIS — N186 End stage renal disease: Secondary | ICD-10-CM | POA: Diagnosis not present

## 2016-05-23 DIAGNOSIS — N2581 Secondary hyperparathyroidism of renal origin: Secondary | ICD-10-CM | POA: Diagnosis not present

## 2016-05-26 DIAGNOSIS — N2581 Secondary hyperparathyroidism of renal origin: Secondary | ICD-10-CM | POA: Diagnosis not present

## 2016-05-26 DIAGNOSIS — D631 Anemia in chronic kidney disease: Secondary | ICD-10-CM | POA: Diagnosis not present

## 2016-05-26 DIAGNOSIS — D509 Iron deficiency anemia, unspecified: Secondary | ICD-10-CM | POA: Diagnosis not present

## 2016-05-26 DIAGNOSIS — N186 End stage renal disease: Secondary | ICD-10-CM | POA: Diagnosis not present

## 2016-05-26 DIAGNOSIS — E0810 Diabetes mellitus due to underlying condition with ketoacidosis without coma: Secondary | ICD-10-CM | POA: Diagnosis not present

## 2016-05-28 DIAGNOSIS — N2581 Secondary hyperparathyroidism of renal origin: Secondary | ICD-10-CM | POA: Diagnosis not present

## 2016-05-28 DIAGNOSIS — I120 Hypertensive chronic kidney disease with stage 5 chronic kidney disease or end stage renal disease: Secondary | ICD-10-CM | POA: Diagnosis not present

## 2016-05-28 DIAGNOSIS — E0810 Diabetes mellitus due to underlying condition with ketoacidosis without coma: Secondary | ICD-10-CM | POA: Diagnosis not present

## 2016-05-28 DIAGNOSIS — Z992 Dependence on renal dialysis: Secondary | ICD-10-CM | POA: Diagnosis not present

## 2016-05-28 DIAGNOSIS — D509 Iron deficiency anemia, unspecified: Secondary | ICD-10-CM | POA: Diagnosis not present

## 2016-05-28 DIAGNOSIS — N186 End stage renal disease: Secondary | ICD-10-CM | POA: Diagnosis not present

## 2016-05-28 DIAGNOSIS — D631 Anemia in chronic kidney disease: Secondary | ICD-10-CM | POA: Diagnosis not present

## 2016-05-30 DIAGNOSIS — D509 Iron deficiency anemia, unspecified: Secondary | ICD-10-CM | POA: Diagnosis not present

## 2016-05-30 DIAGNOSIS — E1129 Type 2 diabetes mellitus with other diabetic kidney complication: Secondary | ICD-10-CM | POA: Diagnosis not present

## 2016-05-30 DIAGNOSIS — N2581 Secondary hyperparathyroidism of renal origin: Secondary | ICD-10-CM | POA: Diagnosis not present

## 2016-05-30 DIAGNOSIS — N186 End stage renal disease: Secondary | ICD-10-CM | POA: Diagnosis not present

## 2016-05-30 DIAGNOSIS — D631 Anemia in chronic kidney disease: Secondary | ICD-10-CM | POA: Diagnosis not present

## 2016-05-30 DIAGNOSIS — E0810 Diabetes mellitus due to underlying condition with ketoacidosis without coma: Secondary | ICD-10-CM | POA: Diagnosis not present

## 2016-06-02 DIAGNOSIS — E0810 Diabetes mellitus due to underlying condition with ketoacidosis without coma: Secondary | ICD-10-CM | POA: Diagnosis not present

## 2016-06-02 DIAGNOSIS — D631 Anemia in chronic kidney disease: Secondary | ICD-10-CM | POA: Diagnosis not present

## 2016-06-02 DIAGNOSIS — D509 Iron deficiency anemia, unspecified: Secondary | ICD-10-CM | POA: Diagnosis not present

## 2016-06-02 DIAGNOSIS — N2581 Secondary hyperparathyroidism of renal origin: Secondary | ICD-10-CM | POA: Diagnosis not present

## 2016-06-02 DIAGNOSIS — E1129 Type 2 diabetes mellitus with other diabetic kidney complication: Secondary | ICD-10-CM | POA: Diagnosis not present

## 2016-06-02 DIAGNOSIS — N186 End stage renal disease: Secondary | ICD-10-CM | POA: Diagnosis not present

## 2016-06-04 DIAGNOSIS — D509 Iron deficiency anemia, unspecified: Secondary | ICD-10-CM | POA: Diagnosis not present

## 2016-06-04 DIAGNOSIS — D631 Anemia in chronic kidney disease: Secondary | ICD-10-CM | POA: Diagnosis not present

## 2016-06-04 DIAGNOSIS — E1129 Type 2 diabetes mellitus with other diabetic kidney complication: Secondary | ICD-10-CM | POA: Diagnosis not present

## 2016-06-04 DIAGNOSIS — N2581 Secondary hyperparathyroidism of renal origin: Secondary | ICD-10-CM | POA: Diagnosis not present

## 2016-06-04 DIAGNOSIS — N186 End stage renal disease: Secondary | ICD-10-CM | POA: Diagnosis not present

## 2016-06-04 DIAGNOSIS — E0810 Diabetes mellitus due to underlying condition with ketoacidosis without coma: Secondary | ICD-10-CM | POA: Diagnosis not present

## 2016-06-06 DIAGNOSIS — N186 End stage renal disease: Secondary | ICD-10-CM | POA: Diagnosis not present

## 2016-06-06 DIAGNOSIS — E1129 Type 2 diabetes mellitus with other diabetic kidney complication: Secondary | ICD-10-CM | POA: Diagnosis not present

## 2016-06-06 DIAGNOSIS — D509 Iron deficiency anemia, unspecified: Secondary | ICD-10-CM | POA: Diagnosis not present

## 2016-06-06 DIAGNOSIS — N2581 Secondary hyperparathyroidism of renal origin: Secondary | ICD-10-CM | POA: Diagnosis not present

## 2016-06-06 DIAGNOSIS — D631 Anemia in chronic kidney disease: Secondary | ICD-10-CM | POA: Diagnosis not present

## 2016-06-06 DIAGNOSIS — E0810 Diabetes mellitus due to underlying condition with ketoacidosis without coma: Secondary | ICD-10-CM | POA: Diagnosis not present

## 2016-06-08 DIAGNOSIS — N186 End stage renal disease: Secondary | ICD-10-CM | POA: Diagnosis not present

## 2016-06-08 DIAGNOSIS — E0810 Diabetes mellitus due to underlying condition with ketoacidosis without coma: Secondary | ICD-10-CM | POA: Diagnosis not present

## 2016-06-08 DIAGNOSIS — E1129 Type 2 diabetes mellitus with other diabetic kidney complication: Secondary | ICD-10-CM | POA: Diagnosis not present

## 2016-06-08 DIAGNOSIS — D631 Anemia in chronic kidney disease: Secondary | ICD-10-CM | POA: Diagnosis not present

## 2016-06-08 DIAGNOSIS — N2581 Secondary hyperparathyroidism of renal origin: Secondary | ICD-10-CM | POA: Diagnosis not present

## 2016-06-08 DIAGNOSIS — D509 Iron deficiency anemia, unspecified: Secondary | ICD-10-CM | POA: Diagnosis not present

## 2016-06-11 DIAGNOSIS — E1129 Type 2 diabetes mellitus with other diabetic kidney complication: Secondary | ICD-10-CM | POA: Diagnosis not present

## 2016-06-11 DIAGNOSIS — D631 Anemia in chronic kidney disease: Secondary | ICD-10-CM | POA: Diagnosis not present

## 2016-06-11 DIAGNOSIS — D509 Iron deficiency anemia, unspecified: Secondary | ICD-10-CM | POA: Diagnosis not present

## 2016-06-11 DIAGNOSIS — N186 End stage renal disease: Secondary | ICD-10-CM | POA: Diagnosis not present

## 2016-06-11 DIAGNOSIS — N2581 Secondary hyperparathyroidism of renal origin: Secondary | ICD-10-CM | POA: Diagnosis not present

## 2016-06-11 DIAGNOSIS — E0810 Diabetes mellitus due to underlying condition with ketoacidosis without coma: Secondary | ICD-10-CM | POA: Diagnosis not present

## 2016-06-13 DIAGNOSIS — E1129 Type 2 diabetes mellitus with other diabetic kidney complication: Secondary | ICD-10-CM | POA: Diagnosis not present

## 2016-06-13 DIAGNOSIS — N2581 Secondary hyperparathyroidism of renal origin: Secondary | ICD-10-CM | POA: Diagnosis not present

## 2016-06-13 DIAGNOSIS — N186 End stage renal disease: Secondary | ICD-10-CM | POA: Diagnosis not present

## 2016-06-13 DIAGNOSIS — D509 Iron deficiency anemia, unspecified: Secondary | ICD-10-CM | POA: Diagnosis not present

## 2016-06-13 DIAGNOSIS — E0810 Diabetes mellitus due to underlying condition with ketoacidosis without coma: Secondary | ICD-10-CM | POA: Diagnosis not present

## 2016-06-13 DIAGNOSIS — D631 Anemia in chronic kidney disease: Secondary | ICD-10-CM | POA: Diagnosis not present

## 2016-06-16 DIAGNOSIS — E0810 Diabetes mellitus due to underlying condition with ketoacidosis without coma: Secondary | ICD-10-CM | POA: Diagnosis not present

## 2016-06-16 DIAGNOSIS — E1129 Type 2 diabetes mellitus with other diabetic kidney complication: Secondary | ICD-10-CM | POA: Diagnosis not present

## 2016-06-16 DIAGNOSIS — D509 Iron deficiency anemia, unspecified: Secondary | ICD-10-CM | POA: Diagnosis not present

## 2016-06-16 DIAGNOSIS — N186 End stage renal disease: Secondary | ICD-10-CM | POA: Diagnosis not present

## 2016-06-16 DIAGNOSIS — N2581 Secondary hyperparathyroidism of renal origin: Secondary | ICD-10-CM | POA: Diagnosis not present

## 2016-06-16 DIAGNOSIS — D631 Anemia in chronic kidney disease: Secondary | ICD-10-CM | POA: Diagnosis not present

## 2016-06-18 DIAGNOSIS — E0810 Diabetes mellitus due to underlying condition with ketoacidosis without coma: Secondary | ICD-10-CM | POA: Diagnosis not present

## 2016-06-18 DIAGNOSIS — D631 Anemia in chronic kidney disease: Secondary | ICD-10-CM | POA: Diagnosis not present

## 2016-06-18 DIAGNOSIS — N186 End stage renal disease: Secondary | ICD-10-CM | POA: Diagnosis not present

## 2016-06-18 DIAGNOSIS — D509 Iron deficiency anemia, unspecified: Secondary | ICD-10-CM | POA: Diagnosis not present

## 2016-06-18 DIAGNOSIS — N2581 Secondary hyperparathyroidism of renal origin: Secondary | ICD-10-CM | POA: Diagnosis not present

## 2016-06-18 DIAGNOSIS — E1129 Type 2 diabetes mellitus with other diabetic kidney complication: Secondary | ICD-10-CM | POA: Diagnosis not present

## 2016-06-20 DIAGNOSIS — E0810 Diabetes mellitus due to underlying condition with ketoacidosis without coma: Secondary | ICD-10-CM | POA: Diagnosis not present

## 2016-06-20 DIAGNOSIS — D509 Iron deficiency anemia, unspecified: Secondary | ICD-10-CM | POA: Diagnosis not present

## 2016-06-20 DIAGNOSIS — D631 Anemia in chronic kidney disease: Secondary | ICD-10-CM | POA: Diagnosis not present

## 2016-06-20 DIAGNOSIS — E1129 Type 2 diabetes mellitus with other diabetic kidney complication: Secondary | ICD-10-CM | POA: Diagnosis not present

## 2016-06-20 DIAGNOSIS — N2581 Secondary hyperparathyroidism of renal origin: Secondary | ICD-10-CM | POA: Diagnosis not present

## 2016-06-20 DIAGNOSIS — N186 End stage renal disease: Secondary | ICD-10-CM | POA: Diagnosis not present

## 2016-06-23 DIAGNOSIS — E1129 Type 2 diabetes mellitus with other diabetic kidney complication: Secondary | ICD-10-CM | POA: Diagnosis not present

## 2016-06-23 DIAGNOSIS — N2581 Secondary hyperparathyroidism of renal origin: Secondary | ICD-10-CM | POA: Diagnosis not present

## 2016-06-23 DIAGNOSIS — N186 End stage renal disease: Secondary | ICD-10-CM | POA: Diagnosis not present

## 2016-06-23 DIAGNOSIS — E0810 Diabetes mellitus due to underlying condition with ketoacidosis without coma: Secondary | ICD-10-CM | POA: Diagnosis not present

## 2016-06-23 DIAGNOSIS — D631 Anemia in chronic kidney disease: Secondary | ICD-10-CM | POA: Diagnosis not present

## 2016-06-23 DIAGNOSIS — D509 Iron deficiency anemia, unspecified: Secondary | ICD-10-CM | POA: Diagnosis not present

## 2016-06-25 DIAGNOSIS — N2581 Secondary hyperparathyroidism of renal origin: Secondary | ICD-10-CM | POA: Diagnosis not present

## 2016-06-25 DIAGNOSIS — D509 Iron deficiency anemia, unspecified: Secondary | ICD-10-CM | POA: Diagnosis not present

## 2016-06-25 DIAGNOSIS — E0810 Diabetes mellitus due to underlying condition with ketoacidosis without coma: Secondary | ICD-10-CM | POA: Diagnosis not present

## 2016-06-25 DIAGNOSIS — D631 Anemia in chronic kidney disease: Secondary | ICD-10-CM | POA: Diagnosis not present

## 2016-06-25 DIAGNOSIS — E1129 Type 2 diabetes mellitus with other diabetic kidney complication: Secondary | ICD-10-CM | POA: Diagnosis not present

## 2016-06-25 DIAGNOSIS — N186 End stage renal disease: Secondary | ICD-10-CM | POA: Diagnosis not present

## 2016-06-27 DIAGNOSIS — E1129 Type 2 diabetes mellitus with other diabetic kidney complication: Secondary | ICD-10-CM | POA: Diagnosis not present

## 2016-06-27 DIAGNOSIS — I120 Hypertensive chronic kidney disease with stage 5 chronic kidney disease or end stage renal disease: Secondary | ICD-10-CM | POA: Diagnosis not present

## 2016-06-27 DIAGNOSIS — E0810 Diabetes mellitus due to underlying condition with ketoacidosis without coma: Secondary | ICD-10-CM | POA: Diagnosis not present

## 2016-06-27 DIAGNOSIS — D509 Iron deficiency anemia, unspecified: Secondary | ICD-10-CM | POA: Diagnosis not present

## 2016-06-27 DIAGNOSIS — Z992 Dependence on renal dialysis: Secondary | ICD-10-CM | POA: Diagnosis not present

## 2016-06-27 DIAGNOSIS — N2581 Secondary hyperparathyroidism of renal origin: Secondary | ICD-10-CM | POA: Diagnosis not present

## 2016-06-27 DIAGNOSIS — N186 End stage renal disease: Secondary | ICD-10-CM | POA: Diagnosis not present

## 2016-06-27 DIAGNOSIS — D631 Anemia in chronic kidney disease: Secondary | ICD-10-CM | POA: Diagnosis not present

## 2016-06-30 DIAGNOSIS — D631 Anemia in chronic kidney disease: Secondary | ICD-10-CM | POA: Diagnosis not present

## 2016-06-30 DIAGNOSIS — E0810 Diabetes mellitus due to underlying condition with ketoacidosis without coma: Secondary | ICD-10-CM | POA: Diagnosis not present

## 2016-06-30 DIAGNOSIS — N186 End stage renal disease: Secondary | ICD-10-CM | POA: Diagnosis not present

## 2016-06-30 DIAGNOSIS — D509 Iron deficiency anemia, unspecified: Secondary | ICD-10-CM | POA: Diagnosis not present

## 2016-06-30 DIAGNOSIS — N2581 Secondary hyperparathyroidism of renal origin: Secondary | ICD-10-CM | POA: Diagnosis not present

## 2016-07-02 DIAGNOSIS — N186 End stage renal disease: Secondary | ICD-10-CM | POA: Diagnosis not present

## 2016-07-02 DIAGNOSIS — N2581 Secondary hyperparathyroidism of renal origin: Secondary | ICD-10-CM | POA: Diagnosis not present

## 2016-07-02 DIAGNOSIS — E0810 Diabetes mellitus due to underlying condition with ketoacidosis without coma: Secondary | ICD-10-CM | POA: Diagnosis not present

## 2016-07-02 DIAGNOSIS — D509 Iron deficiency anemia, unspecified: Secondary | ICD-10-CM | POA: Diagnosis not present

## 2016-07-02 DIAGNOSIS — D631 Anemia in chronic kidney disease: Secondary | ICD-10-CM | POA: Diagnosis not present

## 2016-07-04 DIAGNOSIS — N2581 Secondary hyperparathyroidism of renal origin: Secondary | ICD-10-CM | POA: Diagnosis not present

## 2016-07-04 DIAGNOSIS — N186 End stage renal disease: Secondary | ICD-10-CM | POA: Diagnosis not present

## 2016-07-04 DIAGNOSIS — D509 Iron deficiency anemia, unspecified: Secondary | ICD-10-CM | POA: Diagnosis not present

## 2016-07-04 DIAGNOSIS — E0810 Diabetes mellitus due to underlying condition with ketoacidosis without coma: Secondary | ICD-10-CM | POA: Diagnosis not present

## 2016-07-04 DIAGNOSIS — D631 Anemia in chronic kidney disease: Secondary | ICD-10-CM | POA: Diagnosis not present

## 2016-07-07 DIAGNOSIS — E0810 Diabetes mellitus due to underlying condition with ketoacidosis without coma: Secondary | ICD-10-CM | POA: Diagnosis not present

## 2016-07-07 DIAGNOSIS — D509 Iron deficiency anemia, unspecified: Secondary | ICD-10-CM | POA: Diagnosis not present

## 2016-07-07 DIAGNOSIS — D631 Anemia in chronic kidney disease: Secondary | ICD-10-CM | POA: Diagnosis not present

## 2016-07-07 DIAGNOSIS — N2581 Secondary hyperparathyroidism of renal origin: Secondary | ICD-10-CM | POA: Diagnosis not present

## 2016-07-07 DIAGNOSIS — N186 End stage renal disease: Secondary | ICD-10-CM | POA: Diagnosis not present

## 2016-07-09 DIAGNOSIS — E0810 Diabetes mellitus due to underlying condition with ketoacidosis without coma: Secondary | ICD-10-CM | POA: Diagnosis not present

## 2016-07-09 DIAGNOSIS — N2581 Secondary hyperparathyroidism of renal origin: Secondary | ICD-10-CM | POA: Diagnosis not present

## 2016-07-09 DIAGNOSIS — N186 End stage renal disease: Secondary | ICD-10-CM | POA: Diagnosis not present

## 2016-07-09 DIAGNOSIS — D509 Iron deficiency anemia, unspecified: Secondary | ICD-10-CM | POA: Diagnosis not present

## 2016-07-09 DIAGNOSIS — D631 Anemia in chronic kidney disease: Secondary | ICD-10-CM | POA: Diagnosis not present

## 2016-07-11 DIAGNOSIS — N2581 Secondary hyperparathyroidism of renal origin: Secondary | ICD-10-CM | POA: Diagnosis not present

## 2016-07-11 DIAGNOSIS — N186 End stage renal disease: Secondary | ICD-10-CM | POA: Diagnosis not present

## 2016-07-11 DIAGNOSIS — D631 Anemia in chronic kidney disease: Secondary | ICD-10-CM | POA: Diagnosis not present

## 2016-07-11 DIAGNOSIS — D509 Iron deficiency anemia, unspecified: Secondary | ICD-10-CM | POA: Diagnosis not present

## 2016-07-11 DIAGNOSIS — E0810 Diabetes mellitus due to underlying condition with ketoacidosis without coma: Secondary | ICD-10-CM | POA: Diagnosis not present

## 2016-07-14 DIAGNOSIS — N186 End stage renal disease: Secondary | ICD-10-CM | POA: Diagnosis not present

## 2016-07-14 DIAGNOSIS — D631 Anemia in chronic kidney disease: Secondary | ICD-10-CM | POA: Diagnosis not present

## 2016-07-14 DIAGNOSIS — N2581 Secondary hyperparathyroidism of renal origin: Secondary | ICD-10-CM | POA: Diagnosis not present

## 2016-07-14 DIAGNOSIS — D509 Iron deficiency anemia, unspecified: Secondary | ICD-10-CM | POA: Diagnosis not present

## 2016-07-14 DIAGNOSIS — E0810 Diabetes mellitus due to underlying condition with ketoacidosis without coma: Secondary | ICD-10-CM | POA: Diagnosis not present

## 2016-07-16 DIAGNOSIS — N186 End stage renal disease: Secondary | ICD-10-CM | POA: Diagnosis not present

## 2016-07-16 DIAGNOSIS — D509 Iron deficiency anemia, unspecified: Secondary | ICD-10-CM | POA: Diagnosis not present

## 2016-07-16 DIAGNOSIS — N2581 Secondary hyperparathyroidism of renal origin: Secondary | ICD-10-CM | POA: Diagnosis not present

## 2016-07-16 DIAGNOSIS — E0810 Diabetes mellitus due to underlying condition with ketoacidosis without coma: Secondary | ICD-10-CM | POA: Diagnosis not present

## 2016-07-16 DIAGNOSIS — D631 Anemia in chronic kidney disease: Secondary | ICD-10-CM | POA: Diagnosis not present

## 2016-07-18 DIAGNOSIS — D631 Anemia in chronic kidney disease: Secondary | ICD-10-CM | POA: Diagnosis not present

## 2016-07-18 DIAGNOSIS — N186 End stage renal disease: Secondary | ICD-10-CM | POA: Diagnosis not present

## 2016-07-18 DIAGNOSIS — N2581 Secondary hyperparathyroidism of renal origin: Secondary | ICD-10-CM | POA: Diagnosis not present

## 2016-07-18 DIAGNOSIS — D509 Iron deficiency anemia, unspecified: Secondary | ICD-10-CM | POA: Diagnosis not present

## 2016-07-18 DIAGNOSIS — E0810 Diabetes mellitus due to underlying condition with ketoacidosis without coma: Secondary | ICD-10-CM | POA: Diagnosis not present

## 2016-07-21 ENCOUNTER — Other Ambulatory Visit
Admission: RE | Admit: 2016-07-21 | Discharge: 2016-07-21 | Disposition: A | Discharge status: Home / Self Care | Payer: Medicare Other | Source: Ambulatory Visit | Attending: Nephrology | Admitting: Nephrology

## 2016-07-21 DIAGNOSIS — N186 End stage renal disease: Secondary | ICD-10-CM | POA: Diagnosis not present

## 2016-07-21 DIAGNOSIS — N2581 Secondary hyperparathyroidism of renal origin: Secondary | ICD-10-CM | POA: Diagnosis not present

## 2016-07-21 DIAGNOSIS — E0810 Diabetes mellitus due to underlying condition with ketoacidosis without coma: Secondary | ICD-10-CM | POA: Diagnosis not present

## 2016-07-21 DIAGNOSIS — D631 Anemia in chronic kidney disease: Secondary | ICD-10-CM | POA: Diagnosis not present

## 2016-07-21 DIAGNOSIS — E875 Hyperkalemia: Secondary | ICD-10-CM | POA: Insufficient documentation

## 2016-07-21 DIAGNOSIS — D509 Iron deficiency anemia, unspecified: Secondary | ICD-10-CM | POA: Diagnosis not present

## 2016-07-21 LAB — POTASSIUM: Potassium: 7.5 mmol/L (ref 3.5–5.1)

## 2016-07-23 DIAGNOSIS — N2581 Secondary hyperparathyroidism of renal origin: Secondary | ICD-10-CM | POA: Diagnosis not present

## 2016-07-23 DIAGNOSIS — D631 Anemia in chronic kidney disease: Secondary | ICD-10-CM | POA: Diagnosis not present

## 2016-07-23 DIAGNOSIS — N186 End stage renal disease: Secondary | ICD-10-CM | POA: Diagnosis not present

## 2016-07-23 DIAGNOSIS — E0810 Diabetes mellitus due to underlying condition with ketoacidosis without coma: Secondary | ICD-10-CM | POA: Diagnosis not present

## 2016-07-23 DIAGNOSIS — D509 Iron deficiency anemia, unspecified: Secondary | ICD-10-CM | POA: Diagnosis not present

## 2016-07-25 DIAGNOSIS — N186 End stage renal disease: Secondary | ICD-10-CM | POA: Diagnosis not present

## 2016-07-25 DIAGNOSIS — E0810 Diabetes mellitus due to underlying condition with ketoacidosis without coma: Secondary | ICD-10-CM | POA: Diagnosis not present

## 2016-07-25 DIAGNOSIS — D631 Anemia in chronic kidney disease: Secondary | ICD-10-CM | POA: Diagnosis not present

## 2016-07-25 DIAGNOSIS — N2581 Secondary hyperparathyroidism of renal origin: Secondary | ICD-10-CM | POA: Diagnosis not present

## 2016-07-25 DIAGNOSIS — D509 Iron deficiency anemia, unspecified: Secondary | ICD-10-CM | POA: Diagnosis not present

## 2016-07-28 DIAGNOSIS — E0810 Diabetes mellitus due to underlying condition with ketoacidosis without coma: Secondary | ICD-10-CM | POA: Diagnosis not present

## 2016-07-28 DIAGNOSIS — D509 Iron deficiency anemia, unspecified: Secondary | ICD-10-CM | POA: Diagnosis not present

## 2016-07-28 DIAGNOSIS — Z992 Dependence on renal dialysis: Secondary | ICD-10-CM | POA: Diagnosis not present

## 2016-07-28 DIAGNOSIS — I120 Hypertensive chronic kidney disease with stage 5 chronic kidney disease or end stage renal disease: Secondary | ICD-10-CM | POA: Diagnosis not present

## 2016-07-28 DIAGNOSIS — D631 Anemia in chronic kidney disease: Secondary | ICD-10-CM | POA: Diagnosis not present

## 2016-07-28 DIAGNOSIS — N2581 Secondary hyperparathyroidism of renal origin: Secondary | ICD-10-CM | POA: Diagnosis not present

## 2016-07-28 DIAGNOSIS — N186 End stage renal disease: Secondary | ICD-10-CM | POA: Diagnosis not present

## 2016-07-30 DIAGNOSIS — E875 Hyperkalemia: Secondary | ICD-10-CM | POA: Diagnosis not present

## 2016-07-30 DIAGNOSIS — N186 End stage renal disease: Secondary | ICD-10-CM | POA: Diagnosis not present

## 2016-07-30 DIAGNOSIS — D509 Iron deficiency anemia, unspecified: Secondary | ICD-10-CM | POA: Diagnosis not present

## 2016-07-30 DIAGNOSIS — D631 Anemia in chronic kidney disease: Secondary | ICD-10-CM | POA: Diagnosis not present

## 2016-07-30 DIAGNOSIS — E0810 Diabetes mellitus due to underlying condition with ketoacidosis without coma: Secondary | ICD-10-CM | POA: Diagnosis not present

## 2016-07-30 DIAGNOSIS — N2581 Secondary hyperparathyroidism of renal origin: Secondary | ICD-10-CM | POA: Diagnosis not present

## 2016-08-01 DIAGNOSIS — N2581 Secondary hyperparathyroidism of renal origin: Secondary | ICD-10-CM | POA: Diagnosis not present

## 2016-08-01 DIAGNOSIS — D509 Iron deficiency anemia, unspecified: Secondary | ICD-10-CM | POA: Diagnosis not present

## 2016-08-01 DIAGNOSIS — D631 Anemia in chronic kidney disease: Secondary | ICD-10-CM | POA: Diagnosis not present

## 2016-08-01 DIAGNOSIS — E875 Hyperkalemia: Secondary | ICD-10-CM | POA: Diagnosis not present

## 2016-08-01 DIAGNOSIS — N186 End stage renal disease: Secondary | ICD-10-CM | POA: Diagnosis not present

## 2016-08-01 DIAGNOSIS — E0810 Diabetes mellitus due to underlying condition with ketoacidosis without coma: Secondary | ICD-10-CM | POA: Diagnosis not present

## 2016-08-04 DIAGNOSIS — N2581 Secondary hyperparathyroidism of renal origin: Secondary | ICD-10-CM | POA: Diagnosis not present

## 2016-08-04 DIAGNOSIS — E875 Hyperkalemia: Secondary | ICD-10-CM | POA: Diagnosis not present

## 2016-08-04 DIAGNOSIS — N186 End stage renal disease: Secondary | ICD-10-CM | POA: Diagnosis not present

## 2016-08-04 DIAGNOSIS — D631 Anemia in chronic kidney disease: Secondary | ICD-10-CM | POA: Diagnosis not present

## 2016-08-04 DIAGNOSIS — D509 Iron deficiency anemia, unspecified: Secondary | ICD-10-CM | POA: Diagnosis not present

## 2016-08-04 DIAGNOSIS — E0810 Diabetes mellitus due to underlying condition with ketoacidosis without coma: Secondary | ICD-10-CM | POA: Diagnosis not present

## 2016-08-06 DIAGNOSIS — E875 Hyperkalemia: Secondary | ICD-10-CM | POA: Diagnosis not present

## 2016-08-06 DIAGNOSIS — N186 End stage renal disease: Secondary | ICD-10-CM | POA: Diagnosis not present

## 2016-08-06 DIAGNOSIS — E0810 Diabetes mellitus due to underlying condition with ketoacidosis without coma: Secondary | ICD-10-CM | POA: Diagnosis not present

## 2016-08-06 DIAGNOSIS — D631 Anemia in chronic kidney disease: Secondary | ICD-10-CM | POA: Diagnosis not present

## 2016-08-06 DIAGNOSIS — N2581 Secondary hyperparathyroidism of renal origin: Secondary | ICD-10-CM | POA: Diagnosis not present

## 2016-08-06 DIAGNOSIS — D509 Iron deficiency anemia, unspecified: Secondary | ICD-10-CM | POA: Diagnosis not present

## 2016-08-08 DIAGNOSIS — D509 Iron deficiency anemia, unspecified: Secondary | ICD-10-CM | POA: Diagnosis not present

## 2016-08-08 DIAGNOSIS — E0810 Diabetes mellitus due to underlying condition with ketoacidosis without coma: Secondary | ICD-10-CM | POA: Diagnosis not present

## 2016-08-08 DIAGNOSIS — N2581 Secondary hyperparathyroidism of renal origin: Secondary | ICD-10-CM | POA: Diagnosis not present

## 2016-08-08 DIAGNOSIS — D631 Anemia in chronic kidney disease: Secondary | ICD-10-CM | POA: Diagnosis not present

## 2016-08-08 DIAGNOSIS — N186 End stage renal disease: Secondary | ICD-10-CM | POA: Diagnosis not present

## 2016-08-08 DIAGNOSIS — E875 Hyperkalemia: Secondary | ICD-10-CM | POA: Diagnosis not present

## 2016-08-11 ENCOUNTER — Other Ambulatory Visit
Admission: RE | Admit: 2016-08-11 | Discharge: 2016-08-11 | Disposition: A | Discharge status: Home / Self Care | Payer: Medicare Other | Source: Other Acute Inpatient Hospital | Attending: Nephrology | Admitting: Nephrology

## 2016-08-11 DIAGNOSIS — D509 Iron deficiency anemia, unspecified: Secondary | ICD-10-CM | POA: Diagnosis not present

## 2016-08-11 DIAGNOSIS — N2581 Secondary hyperparathyroidism of renal origin: Secondary | ICD-10-CM | POA: Diagnosis not present

## 2016-08-11 DIAGNOSIS — E875 Hyperkalemia: Secondary | ICD-10-CM | POA: Insufficient documentation

## 2016-08-11 DIAGNOSIS — D631 Anemia in chronic kidney disease: Secondary | ICD-10-CM | POA: Diagnosis not present

## 2016-08-11 DIAGNOSIS — N186 End stage renal disease: Secondary | ICD-10-CM | POA: Diagnosis not present

## 2016-08-11 DIAGNOSIS — E0810 Diabetes mellitus due to underlying condition with ketoacidosis without coma: Secondary | ICD-10-CM | POA: Diagnosis not present

## 2016-08-11 LAB — POTASSIUM: Potassium: 6.9 mmol/L (ref 3.5–5.1)

## 2016-08-13 DIAGNOSIS — E875 Hyperkalemia: Secondary | ICD-10-CM | POA: Diagnosis not present

## 2016-08-13 DIAGNOSIS — N2581 Secondary hyperparathyroidism of renal origin: Secondary | ICD-10-CM | POA: Diagnosis not present

## 2016-08-13 DIAGNOSIS — E0810 Diabetes mellitus due to underlying condition with ketoacidosis without coma: Secondary | ICD-10-CM | POA: Diagnosis not present

## 2016-08-13 DIAGNOSIS — D509 Iron deficiency anemia, unspecified: Secondary | ICD-10-CM | POA: Diagnosis not present

## 2016-08-13 DIAGNOSIS — D631 Anemia in chronic kidney disease: Secondary | ICD-10-CM | POA: Diagnosis not present

## 2016-08-13 DIAGNOSIS — N186 End stage renal disease: Secondary | ICD-10-CM | POA: Diagnosis not present

## 2016-08-15 DIAGNOSIS — D509 Iron deficiency anemia, unspecified: Secondary | ICD-10-CM | POA: Diagnosis not present

## 2016-08-15 DIAGNOSIS — N186 End stage renal disease: Secondary | ICD-10-CM | POA: Diagnosis not present

## 2016-08-15 DIAGNOSIS — E875 Hyperkalemia: Secondary | ICD-10-CM | POA: Diagnosis not present

## 2016-08-15 DIAGNOSIS — E0810 Diabetes mellitus due to underlying condition with ketoacidosis without coma: Secondary | ICD-10-CM | POA: Diagnosis not present

## 2016-08-15 DIAGNOSIS — D631 Anemia in chronic kidney disease: Secondary | ICD-10-CM | POA: Diagnosis not present

## 2016-08-15 DIAGNOSIS — N2581 Secondary hyperparathyroidism of renal origin: Secondary | ICD-10-CM | POA: Diagnosis not present

## 2016-08-17 DIAGNOSIS — N2581 Secondary hyperparathyroidism of renal origin: Secondary | ICD-10-CM | POA: Diagnosis not present

## 2016-08-17 DIAGNOSIS — D509 Iron deficiency anemia, unspecified: Secondary | ICD-10-CM | POA: Diagnosis not present

## 2016-08-17 DIAGNOSIS — E875 Hyperkalemia: Secondary | ICD-10-CM | POA: Diagnosis not present

## 2016-08-17 DIAGNOSIS — N186 End stage renal disease: Secondary | ICD-10-CM | POA: Diagnosis not present

## 2016-08-17 DIAGNOSIS — E0810 Diabetes mellitus due to underlying condition with ketoacidosis without coma: Secondary | ICD-10-CM | POA: Diagnosis not present

## 2016-08-17 DIAGNOSIS — D631 Anemia in chronic kidney disease: Secondary | ICD-10-CM | POA: Diagnosis not present

## 2016-08-19 DIAGNOSIS — E875 Hyperkalemia: Secondary | ICD-10-CM | POA: Diagnosis not present

## 2016-08-19 DIAGNOSIS — D631 Anemia in chronic kidney disease: Secondary | ICD-10-CM | POA: Diagnosis not present

## 2016-08-19 DIAGNOSIS — E0810 Diabetes mellitus due to underlying condition with ketoacidosis without coma: Secondary | ICD-10-CM | POA: Diagnosis not present

## 2016-08-19 DIAGNOSIS — D509 Iron deficiency anemia, unspecified: Secondary | ICD-10-CM | POA: Diagnosis not present

## 2016-08-19 DIAGNOSIS — N2581 Secondary hyperparathyroidism of renal origin: Secondary | ICD-10-CM | POA: Diagnosis not present

## 2016-08-19 DIAGNOSIS — N186 End stage renal disease: Secondary | ICD-10-CM | POA: Diagnosis not present

## 2016-08-22 DIAGNOSIS — N2581 Secondary hyperparathyroidism of renal origin: Secondary | ICD-10-CM | POA: Diagnosis not present

## 2016-08-22 DIAGNOSIS — D631 Anemia in chronic kidney disease: Secondary | ICD-10-CM | POA: Diagnosis not present

## 2016-08-22 DIAGNOSIS — E875 Hyperkalemia: Secondary | ICD-10-CM | POA: Diagnosis not present

## 2016-08-22 DIAGNOSIS — E0810 Diabetes mellitus due to underlying condition with ketoacidosis without coma: Secondary | ICD-10-CM | POA: Diagnosis not present

## 2016-08-22 DIAGNOSIS — N186 End stage renal disease: Secondary | ICD-10-CM | POA: Diagnosis not present

## 2016-08-22 DIAGNOSIS — D509 Iron deficiency anemia, unspecified: Secondary | ICD-10-CM | POA: Diagnosis not present

## 2016-08-25 DIAGNOSIS — E875 Hyperkalemia: Secondary | ICD-10-CM | POA: Diagnosis not present

## 2016-08-25 DIAGNOSIS — D509 Iron deficiency anemia, unspecified: Secondary | ICD-10-CM | POA: Diagnosis not present

## 2016-08-25 DIAGNOSIS — N186 End stage renal disease: Secondary | ICD-10-CM | POA: Diagnosis not present

## 2016-08-25 DIAGNOSIS — E0810 Diabetes mellitus due to underlying condition with ketoacidosis without coma: Secondary | ICD-10-CM | POA: Diagnosis not present

## 2016-08-25 DIAGNOSIS — D631 Anemia in chronic kidney disease: Secondary | ICD-10-CM | POA: Diagnosis not present

## 2016-08-25 DIAGNOSIS — N2581 Secondary hyperparathyroidism of renal origin: Secondary | ICD-10-CM | POA: Diagnosis not present

## 2016-08-27 DIAGNOSIS — E875 Hyperkalemia: Secondary | ICD-10-CM | POA: Diagnosis not present

## 2016-08-27 DIAGNOSIS — Z992 Dependence on renal dialysis: Secondary | ICD-10-CM | POA: Diagnosis not present

## 2016-08-27 DIAGNOSIS — N186 End stage renal disease: Secondary | ICD-10-CM | POA: Diagnosis not present

## 2016-08-27 DIAGNOSIS — E0810 Diabetes mellitus due to underlying condition with ketoacidosis without coma: Secondary | ICD-10-CM | POA: Diagnosis not present

## 2016-08-27 DIAGNOSIS — I120 Hypertensive chronic kidney disease with stage 5 chronic kidney disease or end stage renal disease: Secondary | ICD-10-CM | POA: Diagnosis not present

## 2016-08-27 DIAGNOSIS — D509 Iron deficiency anemia, unspecified: Secondary | ICD-10-CM | POA: Diagnosis not present

## 2016-08-27 DIAGNOSIS — D631 Anemia in chronic kidney disease: Secondary | ICD-10-CM | POA: Diagnosis not present

## 2016-08-27 DIAGNOSIS — N2581 Secondary hyperparathyroidism of renal origin: Secondary | ICD-10-CM | POA: Diagnosis not present

## 2016-08-29 DIAGNOSIS — E875 Hyperkalemia: Secondary | ICD-10-CM | POA: Diagnosis not present

## 2016-08-29 DIAGNOSIS — N186 End stage renal disease: Secondary | ICD-10-CM | POA: Diagnosis not present

## 2016-08-29 DIAGNOSIS — D509 Iron deficiency anemia, unspecified: Secondary | ICD-10-CM | POA: Diagnosis not present

## 2016-08-29 DIAGNOSIS — D631 Anemia in chronic kidney disease: Secondary | ICD-10-CM | POA: Diagnosis not present

## 2016-08-29 DIAGNOSIS — E0810 Diabetes mellitus due to underlying condition with ketoacidosis without coma: Secondary | ICD-10-CM | POA: Diagnosis not present

## 2016-08-29 DIAGNOSIS — N2581 Secondary hyperparathyroidism of renal origin: Secondary | ICD-10-CM | POA: Diagnosis not present

## 2016-09-01 DIAGNOSIS — N2581 Secondary hyperparathyroidism of renal origin: Secondary | ICD-10-CM | POA: Diagnosis not present

## 2016-09-01 DIAGNOSIS — N186 End stage renal disease: Secondary | ICD-10-CM | POA: Diagnosis not present

## 2016-09-01 DIAGNOSIS — D631 Anemia in chronic kidney disease: Secondary | ICD-10-CM | POA: Diagnosis not present

## 2016-09-01 DIAGNOSIS — E0810 Diabetes mellitus due to underlying condition with ketoacidosis without coma: Secondary | ICD-10-CM | POA: Diagnosis not present

## 2016-09-01 DIAGNOSIS — D509 Iron deficiency anemia, unspecified: Secondary | ICD-10-CM | POA: Diagnosis not present

## 2016-09-01 DIAGNOSIS — E875 Hyperkalemia: Secondary | ICD-10-CM | POA: Diagnosis not present

## 2016-09-03 DIAGNOSIS — D509 Iron deficiency anemia, unspecified: Secondary | ICD-10-CM | POA: Diagnosis not present

## 2016-09-03 DIAGNOSIS — D631 Anemia in chronic kidney disease: Secondary | ICD-10-CM | POA: Diagnosis not present

## 2016-09-03 DIAGNOSIS — N186 End stage renal disease: Secondary | ICD-10-CM | POA: Diagnosis not present

## 2016-09-03 DIAGNOSIS — N2581 Secondary hyperparathyroidism of renal origin: Secondary | ICD-10-CM | POA: Diagnosis not present

## 2016-09-03 DIAGNOSIS — E0810 Diabetes mellitus due to underlying condition with ketoacidosis without coma: Secondary | ICD-10-CM | POA: Diagnosis not present

## 2016-09-03 DIAGNOSIS — E875 Hyperkalemia: Secondary | ICD-10-CM | POA: Diagnosis not present

## 2016-09-05 DIAGNOSIS — E0810 Diabetes mellitus due to underlying condition with ketoacidosis without coma: Secondary | ICD-10-CM | POA: Diagnosis not present

## 2016-09-05 DIAGNOSIS — N186 End stage renal disease: Secondary | ICD-10-CM | POA: Diagnosis not present

## 2016-09-05 DIAGNOSIS — E875 Hyperkalemia: Secondary | ICD-10-CM | POA: Diagnosis not present

## 2016-09-05 DIAGNOSIS — N2581 Secondary hyperparathyroidism of renal origin: Secondary | ICD-10-CM | POA: Diagnosis not present

## 2016-09-05 DIAGNOSIS — D631 Anemia in chronic kidney disease: Secondary | ICD-10-CM | POA: Diagnosis not present

## 2016-09-05 DIAGNOSIS — D509 Iron deficiency anemia, unspecified: Secondary | ICD-10-CM | POA: Diagnosis not present

## 2016-09-08 DIAGNOSIS — N186 End stage renal disease: Secondary | ICD-10-CM | POA: Diagnosis not present

## 2016-09-08 DIAGNOSIS — E0810 Diabetes mellitus due to underlying condition with ketoacidosis without coma: Secondary | ICD-10-CM | POA: Diagnosis not present

## 2016-09-08 DIAGNOSIS — E875 Hyperkalemia: Secondary | ICD-10-CM | POA: Diagnosis not present

## 2016-09-08 DIAGNOSIS — N2581 Secondary hyperparathyroidism of renal origin: Secondary | ICD-10-CM | POA: Diagnosis not present

## 2016-09-08 DIAGNOSIS — D509 Iron deficiency anemia, unspecified: Secondary | ICD-10-CM | POA: Diagnosis not present

## 2016-09-08 DIAGNOSIS — D631 Anemia in chronic kidney disease: Secondary | ICD-10-CM | POA: Diagnosis not present

## 2016-09-10 DIAGNOSIS — N2581 Secondary hyperparathyroidism of renal origin: Secondary | ICD-10-CM | POA: Diagnosis not present

## 2016-09-10 DIAGNOSIS — D509 Iron deficiency anemia, unspecified: Secondary | ICD-10-CM | POA: Diagnosis not present

## 2016-09-10 DIAGNOSIS — E875 Hyperkalemia: Secondary | ICD-10-CM | POA: Diagnosis not present

## 2016-09-10 DIAGNOSIS — E0810 Diabetes mellitus due to underlying condition with ketoacidosis without coma: Secondary | ICD-10-CM | POA: Diagnosis not present

## 2016-09-10 DIAGNOSIS — N186 End stage renal disease: Secondary | ICD-10-CM | POA: Diagnosis not present

## 2016-09-10 DIAGNOSIS — D631 Anemia in chronic kidney disease: Secondary | ICD-10-CM | POA: Diagnosis not present

## 2016-09-12 DIAGNOSIS — D509 Iron deficiency anemia, unspecified: Secondary | ICD-10-CM | POA: Diagnosis not present

## 2016-09-12 DIAGNOSIS — N186 End stage renal disease: Secondary | ICD-10-CM | POA: Diagnosis not present

## 2016-09-12 DIAGNOSIS — E875 Hyperkalemia: Secondary | ICD-10-CM | POA: Diagnosis not present

## 2016-09-12 DIAGNOSIS — E0810 Diabetes mellitus due to underlying condition with ketoacidosis without coma: Secondary | ICD-10-CM | POA: Diagnosis not present

## 2016-09-12 DIAGNOSIS — N2581 Secondary hyperparathyroidism of renal origin: Secondary | ICD-10-CM | POA: Diagnosis not present

## 2016-09-12 DIAGNOSIS — D631 Anemia in chronic kidney disease: Secondary | ICD-10-CM | POA: Diagnosis not present

## 2016-09-15 DIAGNOSIS — E875 Hyperkalemia: Secondary | ICD-10-CM | POA: Diagnosis not present

## 2016-09-15 DIAGNOSIS — N2581 Secondary hyperparathyroidism of renal origin: Secondary | ICD-10-CM | POA: Diagnosis not present

## 2016-09-15 DIAGNOSIS — N186 End stage renal disease: Secondary | ICD-10-CM | POA: Diagnosis not present

## 2016-09-15 DIAGNOSIS — D509 Iron deficiency anemia, unspecified: Secondary | ICD-10-CM | POA: Diagnosis not present

## 2016-09-15 DIAGNOSIS — D631 Anemia in chronic kidney disease: Secondary | ICD-10-CM | POA: Diagnosis not present

## 2016-09-15 DIAGNOSIS — E0810 Diabetes mellitus due to underlying condition with ketoacidosis without coma: Secondary | ICD-10-CM | POA: Diagnosis not present

## 2016-09-17 DIAGNOSIS — E0810 Diabetes mellitus due to underlying condition with ketoacidosis without coma: Secondary | ICD-10-CM | POA: Diagnosis not present

## 2016-09-17 DIAGNOSIS — D631 Anemia in chronic kidney disease: Secondary | ICD-10-CM | POA: Diagnosis not present

## 2016-09-17 DIAGNOSIS — N2581 Secondary hyperparathyroidism of renal origin: Secondary | ICD-10-CM | POA: Diagnosis not present

## 2016-09-17 DIAGNOSIS — E875 Hyperkalemia: Secondary | ICD-10-CM | POA: Diagnosis not present

## 2016-09-17 DIAGNOSIS — D509 Iron deficiency anemia, unspecified: Secondary | ICD-10-CM | POA: Diagnosis not present

## 2016-09-17 DIAGNOSIS — N186 End stage renal disease: Secondary | ICD-10-CM | POA: Diagnosis not present

## 2016-09-19 DIAGNOSIS — N186 End stage renal disease: Secondary | ICD-10-CM | POA: Diagnosis not present

## 2016-09-19 DIAGNOSIS — D509 Iron deficiency anemia, unspecified: Secondary | ICD-10-CM | POA: Diagnosis not present

## 2016-09-19 DIAGNOSIS — E875 Hyperkalemia: Secondary | ICD-10-CM | POA: Diagnosis not present

## 2016-09-19 DIAGNOSIS — E0810 Diabetes mellitus due to underlying condition with ketoacidosis without coma: Secondary | ICD-10-CM | POA: Diagnosis not present

## 2016-09-19 DIAGNOSIS — D631 Anemia in chronic kidney disease: Secondary | ICD-10-CM | POA: Diagnosis not present

## 2016-09-19 DIAGNOSIS — N2581 Secondary hyperparathyroidism of renal origin: Secondary | ICD-10-CM | POA: Diagnosis not present

## 2016-09-22 DIAGNOSIS — N2581 Secondary hyperparathyroidism of renal origin: Secondary | ICD-10-CM | POA: Diagnosis not present

## 2016-09-22 DIAGNOSIS — N186 End stage renal disease: Secondary | ICD-10-CM | POA: Diagnosis not present

## 2016-09-22 DIAGNOSIS — E875 Hyperkalemia: Secondary | ICD-10-CM | POA: Diagnosis not present

## 2016-09-22 DIAGNOSIS — D631 Anemia in chronic kidney disease: Secondary | ICD-10-CM | POA: Diagnosis not present

## 2016-09-22 DIAGNOSIS — E0810 Diabetes mellitus due to underlying condition with ketoacidosis without coma: Secondary | ICD-10-CM | POA: Diagnosis not present

## 2016-09-22 DIAGNOSIS — D509 Iron deficiency anemia, unspecified: Secondary | ICD-10-CM | POA: Diagnosis not present

## 2016-09-24 DIAGNOSIS — D631 Anemia in chronic kidney disease: Secondary | ICD-10-CM | POA: Diagnosis not present

## 2016-09-24 DIAGNOSIS — E0810 Diabetes mellitus due to underlying condition with ketoacidosis without coma: Secondary | ICD-10-CM | POA: Diagnosis not present

## 2016-09-24 DIAGNOSIS — N186 End stage renal disease: Secondary | ICD-10-CM | POA: Diagnosis not present

## 2016-09-24 DIAGNOSIS — N2581 Secondary hyperparathyroidism of renal origin: Secondary | ICD-10-CM | POA: Diagnosis not present

## 2016-09-24 DIAGNOSIS — E875 Hyperkalemia: Secondary | ICD-10-CM | POA: Diagnosis not present

## 2016-09-24 DIAGNOSIS — D509 Iron deficiency anemia, unspecified: Secondary | ICD-10-CM | POA: Diagnosis not present

## 2016-09-26 DIAGNOSIS — E0810 Diabetes mellitus due to underlying condition with ketoacidosis without coma: Secondary | ICD-10-CM | POA: Diagnosis not present

## 2016-09-26 DIAGNOSIS — N186 End stage renal disease: Secondary | ICD-10-CM | POA: Diagnosis not present

## 2016-09-26 DIAGNOSIS — N2581 Secondary hyperparathyroidism of renal origin: Secondary | ICD-10-CM | POA: Diagnosis not present

## 2016-09-26 DIAGNOSIS — D631 Anemia in chronic kidney disease: Secondary | ICD-10-CM | POA: Diagnosis not present

## 2016-09-26 DIAGNOSIS — E875 Hyperkalemia: Secondary | ICD-10-CM | POA: Diagnosis not present

## 2016-09-26 DIAGNOSIS — D509 Iron deficiency anemia, unspecified: Secondary | ICD-10-CM | POA: Diagnosis not present

## 2016-09-27 DIAGNOSIS — N186 End stage renal disease: Secondary | ICD-10-CM | POA: Diagnosis not present

## 2016-09-27 DIAGNOSIS — I120 Hypertensive chronic kidney disease with stage 5 chronic kidney disease or end stage renal disease: Secondary | ICD-10-CM | POA: Diagnosis not present

## 2016-09-27 DIAGNOSIS — Z992 Dependence on renal dialysis: Secondary | ICD-10-CM | POA: Diagnosis not present

## 2016-09-29 DIAGNOSIS — D631 Anemia in chronic kidney disease: Secondary | ICD-10-CM | POA: Diagnosis not present

## 2016-09-29 DIAGNOSIS — N186 End stage renal disease: Secondary | ICD-10-CM | POA: Diagnosis not present

## 2016-09-29 DIAGNOSIS — E875 Hyperkalemia: Secondary | ICD-10-CM | POA: Diagnosis not present

## 2016-09-29 DIAGNOSIS — E0810 Diabetes mellitus due to underlying condition with ketoacidosis without coma: Secondary | ICD-10-CM | POA: Diagnosis not present

## 2016-09-29 DIAGNOSIS — D509 Iron deficiency anemia, unspecified: Secondary | ICD-10-CM | POA: Diagnosis not present

## 2016-09-29 DIAGNOSIS — N2581 Secondary hyperparathyroidism of renal origin: Secondary | ICD-10-CM | POA: Diagnosis not present

## 2016-10-01 DIAGNOSIS — N186 End stage renal disease: Secondary | ICD-10-CM | POA: Diagnosis not present

## 2016-10-01 DIAGNOSIS — E875 Hyperkalemia: Secondary | ICD-10-CM | POA: Diagnosis not present

## 2016-10-01 DIAGNOSIS — D631 Anemia in chronic kidney disease: Secondary | ICD-10-CM | POA: Diagnosis not present

## 2016-10-01 DIAGNOSIS — D509 Iron deficiency anemia, unspecified: Secondary | ICD-10-CM | POA: Diagnosis not present

## 2016-10-01 DIAGNOSIS — N2581 Secondary hyperparathyroidism of renal origin: Secondary | ICD-10-CM | POA: Diagnosis not present

## 2016-10-01 DIAGNOSIS — E0810 Diabetes mellitus due to underlying condition with ketoacidosis without coma: Secondary | ICD-10-CM | POA: Diagnosis not present

## 2016-10-03 DIAGNOSIS — N186 End stage renal disease: Secondary | ICD-10-CM | POA: Diagnosis not present

## 2016-10-03 DIAGNOSIS — D509 Iron deficiency anemia, unspecified: Secondary | ICD-10-CM | POA: Diagnosis not present

## 2016-10-03 DIAGNOSIS — D631 Anemia in chronic kidney disease: Secondary | ICD-10-CM | POA: Diagnosis not present

## 2016-10-03 DIAGNOSIS — E0810 Diabetes mellitus due to underlying condition with ketoacidosis without coma: Secondary | ICD-10-CM | POA: Diagnosis not present

## 2016-10-03 DIAGNOSIS — N2581 Secondary hyperparathyroidism of renal origin: Secondary | ICD-10-CM | POA: Diagnosis not present

## 2016-10-03 DIAGNOSIS — E875 Hyperkalemia: Secondary | ICD-10-CM | POA: Diagnosis not present

## 2016-10-06 DIAGNOSIS — N186 End stage renal disease: Secondary | ICD-10-CM | POA: Diagnosis not present

## 2016-10-06 DIAGNOSIS — N2581 Secondary hyperparathyroidism of renal origin: Secondary | ICD-10-CM | POA: Diagnosis not present

## 2016-10-06 DIAGNOSIS — E875 Hyperkalemia: Secondary | ICD-10-CM | POA: Diagnosis not present

## 2016-10-06 DIAGNOSIS — D509 Iron deficiency anemia, unspecified: Secondary | ICD-10-CM | POA: Diagnosis not present

## 2016-10-06 DIAGNOSIS — D631 Anemia in chronic kidney disease: Secondary | ICD-10-CM | POA: Diagnosis not present

## 2016-10-06 DIAGNOSIS — E0810 Diabetes mellitus due to underlying condition with ketoacidosis without coma: Secondary | ICD-10-CM | POA: Diagnosis not present

## 2016-10-08 DIAGNOSIS — N2581 Secondary hyperparathyroidism of renal origin: Secondary | ICD-10-CM | POA: Diagnosis not present

## 2016-10-08 DIAGNOSIS — E0810 Diabetes mellitus due to underlying condition with ketoacidosis without coma: Secondary | ICD-10-CM | POA: Diagnosis not present

## 2016-10-08 DIAGNOSIS — N186 End stage renal disease: Secondary | ICD-10-CM | POA: Diagnosis not present

## 2016-10-08 DIAGNOSIS — D509 Iron deficiency anemia, unspecified: Secondary | ICD-10-CM | POA: Diagnosis not present

## 2016-10-08 DIAGNOSIS — E875 Hyperkalemia: Secondary | ICD-10-CM | POA: Diagnosis not present

## 2016-10-08 DIAGNOSIS — D631 Anemia in chronic kidney disease: Secondary | ICD-10-CM | POA: Diagnosis not present

## 2016-10-10 DIAGNOSIS — N186 End stage renal disease: Secondary | ICD-10-CM | POA: Diagnosis not present

## 2016-10-10 DIAGNOSIS — D509 Iron deficiency anemia, unspecified: Secondary | ICD-10-CM | POA: Diagnosis not present

## 2016-10-10 DIAGNOSIS — E875 Hyperkalemia: Secondary | ICD-10-CM | POA: Diagnosis not present

## 2016-10-10 DIAGNOSIS — N2581 Secondary hyperparathyroidism of renal origin: Secondary | ICD-10-CM | POA: Diagnosis not present

## 2016-10-10 DIAGNOSIS — D631 Anemia in chronic kidney disease: Secondary | ICD-10-CM | POA: Diagnosis not present

## 2016-10-10 DIAGNOSIS — E0810 Diabetes mellitus due to underlying condition with ketoacidosis without coma: Secondary | ICD-10-CM | POA: Diagnosis not present

## 2016-10-13 DIAGNOSIS — N2581 Secondary hyperparathyroidism of renal origin: Secondary | ICD-10-CM | POA: Diagnosis not present

## 2016-10-13 DIAGNOSIS — D631 Anemia in chronic kidney disease: Secondary | ICD-10-CM | POA: Diagnosis not present

## 2016-10-13 DIAGNOSIS — E875 Hyperkalemia: Secondary | ICD-10-CM | POA: Diagnosis not present

## 2016-10-13 DIAGNOSIS — D509 Iron deficiency anemia, unspecified: Secondary | ICD-10-CM | POA: Diagnosis not present

## 2016-10-13 DIAGNOSIS — E0810 Diabetes mellitus due to underlying condition with ketoacidosis without coma: Secondary | ICD-10-CM | POA: Diagnosis not present

## 2016-10-13 DIAGNOSIS — N186 End stage renal disease: Secondary | ICD-10-CM | POA: Diagnosis not present

## 2016-10-15 DIAGNOSIS — D509 Iron deficiency anemia, unspecified: Secondary | ICD-10-CM | POA: Diagnosis not present

## 2016-10-15 DIAGNOSIS — N186 End stage renal disease: Secondary | ICD-10-CM | POA: Diagnosis not present

## 2016-10-15 DIAGNOSIS — E875 Hyperkalemia: Secondary | ICD-10-CM | POA: Diagnosis not present

## 2016-10-15 DIAGNOSIS — D631 Anemia in chronic kidney disease: Secondary | ICD-10-CM | POA: Diagnosis not present

## 2016-10-15 DIAGNOSIS — N2581 Secondary hyperparathyroidism of renal origin: Secondary | ICD-10-CM | POA: Diagnosis not present

## 2016-10-15 DIAGNOSIS — E0810 Diabetes mellitus due to underlying condition with ketoacidosis without coma: Secondary | ICD-10-CM | POA: Diagnosis not present

## 2016-10-17 DIAGNOSIS — N2581 Secondary hyperparathyroidism of renal origin: Secondary | ICD-10-CM | POA: Diagnosis not present

## 2016-10-17 DIAGNOSIS — E875 Hyperkalemia: Secondary | ICD-10-CM | POA: Diagnosis not present

## 2016-10-17 DIAGNOSIS — E0810 Diabetes mellitus due to underlying condition with ketoacidosis without coma: Secondary | ICD-10-CM | POA: Diagnosis not present

## 2016-10-17 DIAGNOSIS — N186 End stage renal disease: Secondary | ICD-10-CM | POA: Diagnosis not present

## 2016-10-17 DIAGNOSIS — D631 Anemia in chronic kidney disease: Secondary | ICD-10-CM | POA: Diagnosis not present

## 2016-10-17 DIAGNOSIS — D509 Iron deficiency anemia, unspecified: Secondary | ICD-10-CM | POA: Diagnosis not present

## 2016-10-20 DIAGNOSIS — N2581 Secondary hyperparathyroidism of renal origin: Secondary | ICD-10-CM | POA: Diagnosis not present

## 2016-10-20 DIAGNOSIS — E875 Hyperkalemia: Secondary | ICD-10-CM | POA: Diagnosis not present

## 2016-10-20 DIAGNOSIS — D631 Anemia in chronic kidney disease: Secondary | ICD-10-CM | POA: Diagnosis not present

## 2016-10-20 DIAGNOSIS — N186 End stage renal disease: Secondary | ICD-10-CM | POA: Diagnosis not present

## 2016-10-20 DIAGNOSIS — D509 Iron deficiency anemia, unspecified: Secondary | ICD-10-CM | POA: Diagnosis not present

## 2016-10-20 DIAGNOSIS — E0810 Diabetes mellitus due to underlying condition with ketoacidosis without coma: Secondary | ICD-10-CM | POA: Diagnosis not present

## 2016-10-22 DIAGNOSIS — N2581 Secondary hyperparathyroidism of renal origin: Secondary | ICD-10-CM | POA: Diagnosis not present

## 2016-10-22 DIAGNOSIS — E0810 Diabetes mellitus due to underlying condition with ketoacidosis without coma: Secondary | ICD-10-CM | POA: Diagnosis not present

## 2016-10-22 DIAGNOSIS — N186 End stage renal disease: Secondary | ICD-10-CM | POA: Diagnosis not present

## 2016-10-22 DIAGNOSIS — D631 Anemia in chronic kidney disease: Secondary | ICD-10-CM | POA: Diagnosis not present

## 2016-10-22 DIAGNOSIS — E875 Hyperkalemia: Secondary | ICD-10-CM | POA: Diagnosis not present

## 2016-10-22 DIAGNOSIS — D509 Iron deficiency anemia, unspecified: Secondary | ICD-10-CM | POA: Diagnosis not present

## 2016-10-24 DIAGNOSIS — D631 Anemia in chronic kidney disease: Secondary | ICD-10-CM | POA: Diagnosis not present

## 2016-10-24 DIAGNOSIS — D509 Iron deficiency anemia, unspecified: Secondary | ICD-10-CM | POA: Diagnosis not present

## 2016-10-24 DIAGNOSIS — N186 End stage renal disease: Secondary | ICD-10-CM | POA: Diagnosis not present

## 2016-10-24 DIAGNOSIS — N2581 Secondary hyperparathyroidism of renal origin: Secondary | ICD-10-CM | POA: Diagnosis not present

## 2016-10-24 DIAGNOSIS — E875 Hyperkalemia: Secondary | ICD-10-CM | POA: Diagnosis not present

## 2016-10-24 DIAGNOSIS — E0810 Diabetes mellitus due to underlying condition with ketoacidosis without coma: Secondary | ICD-10-CM | POA: Diagnosis not present

## 2016-10-27 DIAGNOSIS — N2581 Secondary hyperparathyroidism of renal origin: Secondary | ICD-10-CM | POA: Diagnosis not present

## 2016-10-27 DIAGNOSIS — D631 Anemia in chronic kidney disease: Secondary | ICD-10-CM | POA: Diagnosis not present

## 2016-10-27 DIAGNOSIS — N186 End stage renal disease: Secondary | ICD-10-CM | POA: Diagnosis not present

## 2016-10-27 DIAGNOSIS — E0810 Diabetes mellitus due to underlying condition with ketoacidosis without coma: Secondary | ICD-10-CM | POA: Diagnosis not present

## 2016-10-27 DIAGNOSIS — E875 Hyperkalemia: Secondary | ICD-10-CM | POA: Diagnosis not present

## 2016-10-27 DIAGNOSIS — D509 Iron deficiency anemia, unspecified: Secondary | ICD-10-CM | POA: Diagnosis not present

## 2016-10-28 DIAGNOSIS — D688 Other specified coagulation defects: Secondary | ICD-10-CM | POA: Insufficient documentation

## 2016-10-28 DIAGNOSIS — N2581 Secondary hyperparathyroidism of renal origin: Secondary | ICD-10-CM | POA: Insufficient documentation

## 2016-10-28 DIAGNOSIS — I120 Hypertensive chronic kidney disease with stage 5 chronic kidney disease or end stage renal disease: Secondary | ICD-10-CM | POA: Diagnosis not present

## 2016-10-28 DIAGNOSIS — D509 Iron deficiency anemia, unspecified: Secondary | ICD-10-CM | POA: Insufficient documentation

## 2016-10-28 DIAGNOSIS — R509 Fever, unspecified: Secondary | ICD-10-CM | POA: Insufficient documentation

## 2016-10-28 DIAGNOSIS — N186 End stage renal disease: Secondary | ICD-10-CM | POA: Diagnosis not present

## 2016-10-28 DIAGNOSIS — L299 Pruritus, unspecified: Secondary | ICD-10-CM | POA: Insufficient documentation

## 2016-10-28 DIAGNOSIS — R0602 Shortness of breath: Secondary | ICD-10-CM | POA: Insufficient documentation

## 2016-10-28 DIAGNOSIS — Z992 Dependence on renal dialysis: Secondary | ICD-10-CM | POA: Diagnosis not present

## 2016-10-29 ENCOUNTER — Other Ambulatory Visit: Payer: Self-pay | Admitting: *Deleted

## 2016-10-29 DIAGNOSIS — N186 End stage renal disease: Secondary | ICD-10-CM | POA: Diagnosis not present

## 2016-10-29 DIAGNOSIS — N2581 Secondary hyperparathyroidism of renal origin: Secondary | ICD-10-CM | POA: Diagnosis not present

## 2016-10-29 DIAGNOSIS — I1310 Hypertensive heart and chronic kidney disease without heart failure, with stage 1 through stage 4 chronic kidney disease, or unspecified chronic kidney disease: Secondary | ICD-10-CM

## 2016-10-29 DIAGNOSIS — Z01810 Encounter for preprocedural cardiovascular examination: Secondary | ICD-10-CM

## 2016-10-31 DIAGNOSIS — N186 End stage renal disease: Secondary | ICD-10-CM | POA: Diagnosis not present

## 2016-10-31 DIAGNOSIS — N2581 Secondary hyperparathyroidism of renal origin: Secondary | ICD-10-CM | POA: Diagnosis not present

## 2016-10-31 DIAGNOSIS — E1129 Type 2 diabetes mellitus with other diabetic kidney complication: Secondary | ICD-10-CM | POA: Diagnosis not present

## 2016-10-31 DIAGNOSIS — D509 Iron deficiency anemia, unspecified: Secondary | ICD-10-CM | POA: Diagnosis not present

## 2016-11-02 DIAGNOSIS — D509 Iron deficiency anemia, unspecified: Secondary | ICD-10-CM | POA: Diagnosis not present

## 2016-11-02 DIAGNOSIS — N186 End stage renal disease: Secondary | ICD-10-CM | POA: Diagnosis not present

## 2016-11-02 DIAGNOSIS — E1129 Type 2 diabetes mellitus with other diabetic kidney complication: Secondary | ICD-10-CM | POA: Diagnosis not present

## 2016-11-02 DIAGNOSIS — N2581 Secondary hyperparathyroidism of renal origin: Secondary | ICD-10-CM | POA: Diagnosis not present

## 2016-11-04 DIAGNOSIS — N186 End stage renal disease: Secondary | ICD-10-CM | POA: Diagnosis not present

## 2016-11-04 DIAGNOSIS — D509 Iron deficiency anemia, unspecified: Secondary | ICD-10-CM | POA: Diagnosis not present

## 2016-11-04 DIAGNOSIS — N2581 Secondary hyperparathyroidism of renal origin: Secondary | ICD-10-CM | POA: Diagnosis not present

## 2016-11-04 DIAGNOSIS — E1129 Type 2 diabetes mellitus with other diabetic kidney complication: Secondary | ICD-10-CM | POA: Diagnosis not present

## 2016-11-05 ENCOUNTER — Telehealth: Payer: Self-pay

## 2016-11-05 NOTE — Telephone Encounter (Signed)
rec'd call from nurse at [facility], requesting that pt's appt. with [facility] to eval. for new permanent access be moved up.  Reported that the pt. had an appt. on 11/11/16, and told their Social Worker that he couldn't come to that appt., so the appt. got cx'd, and rescheduled to 12/08/16.  Reported that the pt. transferred from [facility] on 10/31/16, and per Dr. Jimmy Footman, only has 30 days to get the new access, or he will not be able to continue to go to HD at the [facility].  Reported that it has been difficult getting the pt. to comply with following through in getting permanent access inserted.  Advised will have a Scheduler call her back with poss. options for an earlier appt.

## 2016-11-06 DIAGNOSIS — N2581 Secondary hyperparathyroidism of renal origin: Secondary | ICD-10-CM | POA: Diagnosis not present

## 2016-11-06 DIAGNOSIS — D509 Iron deficiency anemia, unspecified: Secondary | ICD-10-CM | POA: Diagnosis not present

## 2016-11-06 DIAGNOSIS — N186 End stage renal disease: Secondary | ICD-10-CM | POA: Diagnosis not present

## 2016-11-06 DIAGNOSIS — E1129 Type 2 diabetes mellitus with other diabetic kidney complication: Secondary | ICD-10-CM | POA: Diagnosis not present

## 2016-11-09 DIAGNOSIS — D509 Iron deficiency anemia, unspecified: Secondary | ICD-10-CM | POA: Diagnosis not present

## 2016-11-09 DIAGNOSIS — N186 End stage renal disease: Secondary | ICD-10-CM | POA: Diagnosis not present

## 2016-11-09 DIAGNOSIS — N2581 Secondary hyperparathyroidism of renal origin: Secondary | ICD-10-CM | POA: Diagnosis not present

## 2016-11-09 DIAGNOSIS — E1129 Type 2 diabetes mellitus with other diabetic kidney complication: Secondary | ICD-10-CM | POA: Diagnosis not present

## 2016-11-11 ENCOUNTER — Other Ambulatory Visit (HOSPITAL_COMMUNITY): Payer: Medicare Other

## 2016-11-11 ENCOUNTER — Encounter: Payer: Medicare Other | Admitting: Vascular Surgery

## 2016-11-11 ENCOUNTER — Encounter (HOSPITAL_COMMUNITY): Payer: Medicare Other

## 2016-11-11 DIAGNOSIS — D509 Iron deficiency anemia, unspecified: Secondary | ICD-10-CM | POA: Diagnosis not present

## 2016-11-11 DIAGNOSIS — N186 End stage renal disease: Secondary | ICD-10-CM | POA: Diagnosis not present

## 2016-11-11 DIAGNOSIS — N2581 Secondary hyperparathyroidism of renal origin: Secondary | ICD-10-CM | POA: Diagnosis not present

## 2016-11-11 DIAGNOSIS — E1129 Type 2 diabetes mellitus with other diabetic kidney complication: Secondary | ICD-10-CM | POA: Diagnosis not present

## 2016-11-12 ENCOUNTER — Ambulatory Visit (INDEPENDENT_AMBULATORY_CARE_PROVIDER_SITE_OTHER): Payer: Medicare Other | Admitting: Vascular Surgery

## 2016-11-12 ENCOUNTER — Encounter: Payer: Self-pay | Admitting: Nurse Practitioner

## 2016-11-12 ENCOUNTER — Ambulatory Visit (HOSPITAL_COMMUNITY)
Admission: RE | Admit: 2016-11-12 | Discharge: 2016-11-12 | Disposition: A | Discharge status: Home / Self Care | Payer: Medicare Other | Source: Ambulatory Visit | Attending: Vascular Surgery | Admitting: Vascular Surgery

## 2016-11-12 ENCOUNTER — Encounter: Payer: Self-pay | Admitting: Vascular Surgery

## 2016-11-12 ENCOUNTER — Other Ambulatory Visit: Payer: Self-pay

## 2016-11-12 ENCOUNTER — Ambulatory Visit (INDEPENDENT_AMBULATORY_CARE_PROVIDER_SITE_OTHER)
Admission: RE | Admit: 2016-11-12 | Discharge: 2016-11-12 | Disposition: A | Discharge status: Home / Self Care | Payer: Medicare Other | Source: Ambulatory Visit | Attending: Vascular Surgery | Admitting: Vascular Surgery

## 2016-11-12 VITALS — BP 126/91 | HR 79 | Temp 98.6°F | Resp 18 | Ht 66.0 in | Wt 196.0 lb

## 2016-11-12 DIAGNOSIS — Z01810 Encounter for preprocedural cardiovascular examination: Secondary | ICD-10-CM | POA: Diagnosis not present

## 2016-11-12 DIAGNOSIS — N189 Chronic kidney disease, unspecified: Secondary | ICD-10-CM | POA: Diagnosis not present

## 2016-11-12 DIAGNOSIS — I1310 Hypertensive heart and chronic kidney disease without heart failure, with stage 1 through stage 4 chronic kidney disease, or unspecified chronic kidney disease: Secondary | ICD-10-CM | POA: Insufficient documentation

## 2016-11-12 DIAGNOSIS — N186 End stage renal disease: Secondary | ICD-10-CM

## 2016-11-12 DIAGNOSIS — Z992 Dependence on renal dialysis: Secondary | ICD-10-CM

## 2016-11-12 NOTE — Progress Notes (Signed)
Referring Physician: Dr Smith Mince  Patient name: [name] MRN: [id number] DOB: 10/08/1980 Sex: male  REASON FOR CONSULT: Hemodialysis access  HPI: [name] is a 36 y.o. male, sent for evaluation for placement of a long-term hemodialysis access. He previously had an attempt at a left forearm AV graft and left upper arm AV graft elsewhere. Both of these clotted in a very short time. He has had a right-sided catheter for 3 years. He currently dialyzes Monday Wednesday and Friday at [facility]. He denies any prior complications from his access procedures such as steal or venous hypertension. He states that this didn't work very long. He has never been worked up for hypercoagulable state. Other medical problems include diabetes and hypertension both of which are currently stable.  Past Medical History:  Diagnosis Date  . Diabetes mellitus without complication ([place])   . Dialysis patient ([place])   . Hypertension   . Renal disorder    Dialysis   Past Surgical History:  Procedure Laterality Date  . AV FISTULA PLACEMENT     left arm.  . INSERTION OF DIALYSIS CATHETER     Right subclavian    Family History  Problem Relation Age of Onset  . Diabetes Mellitus II      SOCIAL HISTORY: Social History   Social History  . Marital status: Single    Spouse name: N/A  . Number of children: N/A  . Years of education: N/A   Occupational History  . Not on file.   Social History Main Topics  . Smoking status: Never Smoker  . Smokeless tobacco: Never Used  . Alcohol use No  . Drug use: No  . Sexual activity: No   Other Topics Concern  . Not on file   Social History Narrative  . No narrative on file    No Known Allergies  Current Outpatient Prescriptions  Medication Sig Dispense Refill  . amLODipine (NORVASC) 10 MG tablet Take 10 mg by mouth daily.    . carvedilol (COREG) 25 MG tablet Take 25 mg by mouth 2 (two) times daily with a meal.    . HYDROcodone-acetaminophen  (NORCO/VICODIN) 5-325 MG per tablet Take 1 tablet by mouth every 6 (six) hours as needed for moderate pain. 20 tablet 0  . insulin aspart (NOVOLOG) 100 UNIT/ML injection Inject into the skin 3 (three) times daily before meals.    . insulin glargine (LANTUS) 100 UNIT/ML injection Inject 0.12 mLs (12 Units total) into the skin at bedtime. 10 mL 11  . spironolactone (ALDACTONE) 25 MG tablet Take 25 mg by mouth daily.    . valsartan (DIOVAN) 160 MG tablet Take 160 mg by mouth 2 (two) times daily.    . cloNIDine (CATAPRES) 0.3 MG tablet Take 0.3 mg by mouth 2 (two) times daily.    . insulin lispro (HUMALOG) 100 UNIT/ML injection Inject 8 Units into the skin 3 (three) times daily before meals.    . metoCLOPramide (REGLAN) 5 MG tablet Take 5 mg by mouth 3 (three) times daily before meals.    . minoxidil (LONITEN) 10 MG tablet Take 10 mg by mouth daily.    . pantoprazole (PROTONIX) 40 MG tablet Take 1 tablet (40 mg total) by mouth 2 (two) times daily. (Patient not taking: Reported on 11/12/2016) 60 tablet 0   No current facility-administered medications for this visit.     ROS:   General:  No weight loss, Fever, chills  HEENT: No recent headaches, no nasal  bleeding, no visual changes, no sore throat  Neurologic: No dizziness, blackouts, seizures. No recent symptoms of stroke or mini- stroke. No recent episodes of slurred speech, or temporary blindness.  Cardiac: No recent episodes of chest pain/pressure, no shortness of breath at rest.  No shortness of breath with exertion.  Denies history of atrial fibrillation or irregular heartbeat  Vascular: No history of rest pain in feet.  No history of claudication.  No history of non-healing ulcer, No history of DVT   Pulmonary: No home oxygen, no productive cough, no hemoptysis,  No asthma or wheezing  Musculoskeletal:  [ ]  Arthritis, [ ]  Low back pain,  [ ]  Joint pain  Hematologic:No history of hypercoagulable state.  No history of easy bleeding.  No  history of anemia  Gastrointestinal: No hematochezia or melena,  No gastroesophageal reflux, no trouble swallowing  Urinary: [ ]  xchronic Kidney disease, [X]  on HD - [X]  MWF or [ ]  TTHS, [ ]  Burning with urination, [ ]  Frequent urination, [ ]  Difficulty urinating;   Skin: No rashes  Psychological: No history of anxiety,  No history of depression   Physical Examination  Vitals:   11/12/16 0910  BP: (!) 126/91  Pulse: 79  Resp: 18  Temp: 98.6 F (37 C)  TempSrc: Oral  SpO2: 100%  Weight: 196 lb (88.9 kg)  Height: 5\' 6"  (1.676 m)    Body mass index is 31.64 kg/m.  General:  Alert and oriented, no acute distress HEENT: Normal Neck: No bruit or JVD, right-sided dialysis catheter Pulmonary: Clear to auscultation bilaterally Cardiac: Regular Rate and Rhythm without murmur Abdomen: Soft, non-tender, non-distended Skin: No rash Extremity Pulses:  2+ radial, brachial,pulses bilaterally occluded left forearm and upper arm graft Musculoskeletal: No deformity or edema  Neurologic: Upper and lower extremity motor 5/5 and symmetric  DATA:  Patient had a vein mapping ultrasound today which shows his cephalic and basilic veins are inadequate bilaterally for fistula creation. He also had an arterial duplex exam which are triphasic waveforms with a small radial artery less than 2 mm bilaterally. Brachial artery was 3.5 mm.  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 He has now had a catheter on the right side for 3 years. I believe the best option for him at this point would be to place a right arm AV graft. However we need to rule out central venous occlusion since his catheter has been in place along. I will schedule him for a right central venogram and place his venous catheter under ultrasound since he is a difficult stick for IVs. We have scheduled this for Monday, 11/15/2016 after dialysis. Risks benefits possible complications  and procedure details were explained to the patient today including but not limited to bleeding infection contrast reaction. He understands and agrees to proceed.   PLAN:  See above   Ruta Hinds, MD [facility] Office: [phone] Pager: [phone]

## 2016-11-13 ENCOUNTER — Encounter: Payer: Self-pay | Admitting: Nephrology

## 2016-11-13 DIAGNOSIS — D509 Iron deficiency anemia, unspecified: Secondary | ICD-10-CM | POA: Diagnosis not present

## 2016-11-13 DIAGNOSIS — N186 End stage renal disease: Secondary | ICD-10-CM | POA: Diagnosis not present

## 2016-11-13 DIAGNOSIS — N2581 Secondary hyperparathyroidism of renal origin: Secondary | ICD-10-CM | POA: Diagnosis not present

## 2016-11-13 DIAGNOSIS — E1129 Type 2 diabetes mellitus with other diabetic kidney complication: Secondary | ICD-10-CM | POA: Diagnosis not present

## 2016-11-16 ENCOUNTER — Encounter: Payer: Self-pay | Admitting: *Deleted

## 2016-11-16 ENCOUNTER — Ambulatory Visit (HOSPITAL_COMMUNITY)
Admission: RE | Admit: 2016-11-16 | Discharge: 2016-11-16 | Disposition: A | Discharge status: Home / Self Care | Payer: Medicare Other | Source: Ambulatory Visit | Attending: Vascular Surgery | Admitting: Vascular Surgery

## 2016-11-16 ENCOUNTER — Encounter (HOSPITAL_COMMUNITY)
Admission: RE | Disposition: A | Discharge status: Home / Self Care | Payer: Self-pay | Source: Ambulatory Visit | Attending: Vascular Surgery

## 2016-11-16 DIAGNOSIS — Z992 Dependence on renal dialysis: Secondary | ICD-10-CM | POA: Diagnosis not present

## 2016-11-16 DIAGNOSIS — I120 Hypertensive chronic kidney disease with stage 5 chronic kidney disease or end stage renal disease: Secondary | ICD-10-CM | POA: Insufficient documentation

## 2016-11-16 DIAGNOSIS — D509 Iron deficiency anemia, unspecified: Secondary | ICD-10-CM | POA: Diagnosis not present

## 2016-11-16 DIAGNOSIS — Z794 Long term (current) use of insulin: Secondary | ICD-10-CM | POA: Diagnosis not present

## 2016-11-16 DIAGNOSIS — N2581 Secondary hyperparathyroidism of renal origin: Secondary | ICD-10-CM | POA: Diagnosis not present

## 2016-11-16 DIAGNOSIS — N186 End stage renal disease: Secondary | ICD-10-CM | POA: Insufficient documentation

## 2016-11-16 DIAGNOSIS — Z833 Family history of diabetes mellitus: Secondary | ICD-10-CM | POA: Insufficient documentation

## 2016-11-16 DIAGNOSIS — E1122 Type 2 diabetes mellitus with diabetic chronic kidney disease: Secondary | ICD-10-CM | POA: Diagnosis not present

## 2016-11-16 DIAGNOSIS — E1129 Type 2 diabetes mellitus with other diabetic kidney complication: Secondary | ICD-10-CM | POA: Diagnosis not present

## 2016-11-16 DIAGNOSIS — N185 Chronic kidney disease, stage 5: Secondary | ICD-10-CM | POA: Diagnosis not present

## 2016-11-16 HISTORY — DX: Type 2 diabetes mellitus with diabetic autonomic (poly)neuropathy: E11.43

## 2016-11-16 HISTORY — PX: UPPER EXTREMITY VENOGRAPHY: CATH118272

## 2016-11-16 HISTORY — DX: Type 2 diabetes mellitus with diabetic autonomic (poly)neuropathy: K31.84

## 2016-11-16 LAB — GLUCOSE, CAPILLARY: Glucose-Capillary: 165 mg/dL — ABNORMAL HIGH (ref 65–99)

## 2016-11-16 LAB — ANTITHROMBIN III: AntiThromb III Func: 102 % (ref 75–120)

## 2016-11-16 SURGERY — UPPER EXTREMITY VENOGRAPHY

## 2016-11-16 MED ORDER — LABETALOL HCL 5 MG/ML IV SOLN: 10.0000 mg | INTRAVENOUS | Status: DC | PRN

## 2016-11-16 MED ORDER — IODIXANOL 320 MG/ML IV SOLN
INTRAVENOUS | Status: DC | PRN
  Administered 2016-11-16: 40 mL via INTRAVENOUS

## 2016-11-16 MED ORDER — ONDANSETRON HCL 4 MG/2ML IJ SOLN: 4.0000 mg | Freq: Four times a day (QID) | INTRAMUSCULAR | Status: DC | PRN

## 2016-11-16 MED ORDER — SODIUM CHLORIDE 0.9% FLUSH: 3.0000 mL | INTRAVENOUS | Status: DC | PRN

## 2016-11-16 MED ORDER — HYDRALAZINE HCL 20 MG/ML IJ SOLN: 5.0000 mg | INTRAMUSCULAR | Status: DC | PRN

## 2016-11-16 MED ORDER — METOPROLOL TARTRATE 5 MG/5ML IV SOLN: 2.0000 mg | INTRAVENOUS | Status: DC | PRN

## 2016-11-16 MED ORDER — ACETAMINOPHEN 325 MG RE SUPP: 325.0000 mg | RECTAL | Status: DC | PRN

## 2016-11-16 MED ORDER — HEPARIN (PORCINE) IN NACL 2-0.9 UNIT/ML-% IJ SOLN
INTRAMUSCULAR | Status: DC | PRN
  Administered 2016-11-16: 500 mL

## 2016-11-16 MED ORDER — ACETAMINOPHEN 325 MG PO TABS: 325.0000 mg | ORAL_TABLET | ORAL | Status: DC | PRN

## 2016-11-16 MED ORDER — LIDOCAINE HCL (PF) 1 % IJ SOLN
INTRAMUSCULAR | Status: DC | PRN
  Administered 2016-11-16: 5 mL via INTRADERMAL

## 2016-11-16 SURGICAL SUPPLY — 7 items
COVER PRB 48X5XTLSCP FOLD TPE (BAG) ×1 IMPLANT
COVER PROBE 5X48 (BAG) ×1
KIT PV (KITS) ×2 IMPLANT
STOPCOCK MORSE 400PSI 3WAY (MISCELLANEOUS) ×2 IMPLANT
TRAY PV CATH (CUSTOM PROCEDURE TRAY) ×2 IMPLANT
TUBING CIL FLEX 10 FLL-RA (TUBING) ×2 IMPLANT
WIRE MINI STICK MAX (SHEATH) ×2 IMPLANT

## 2016-11-16 NOTE — Interval H&P Note (Signed)
History and Physical Interval Note:  11/16/2016 11:38 AM  [name]  has presented today for surgery, with the diagnosis of instage renal  The various methods of treatment have been discussed with the patient and family. After consideration of risks, benefits and other options for treatment, the patient has consented to  Procedure(s): Upper Extremity Venography - Right Central (N/A) as a surgical intervention .  The patient's history has been reviewed, patient examined, no change in status, stable for surgery.  I have reviewed the patient's chart and labs.  Questions were answered to the patient's satisfaction.     Ruta Hinds

## 2016-11-16 NOTE — Discharge Instructions (Signed)
Venogram, Care After °Refer to this sheet in the next few weeks. These instructions provide you with information on caring for yourself after your procedure. Your health care provider may also give you more specific instructions. Your treatment has been planned according to current medical practices, but problems sometimes occur. Call your health care provider if you have any problems or questions after your procedure. °WHAT TO EXPECT AFTER THE PROCEDURE °After your procedure, it is typical to have the following sensations: °· Mild discomfort at the catheter insertion site. °HOME CARE INSTRUCTIONS  °· Take all medicines exactly as directed. °· Follow any prescribed diet. °· Follow instructions regarding both rest and physical activity. °· Drink more fluids for the first several days after the procedure in order to help flush dye from your kidneys. °SEEK MEDICAL CARE IF: °· You develop a rash. °· You have fever not controlled by medicine. °SEEK IMMEDIATE MEDICAL CARE IF: °· There is pain, drainage, bleeding, redness, swelling, warmth or a red streak at the site of the IV tube. °· The extremity where your IV tube was placed becomes discolored, numb, or cool. °· You have difficulty breathing or shortness of breath. °· You develop chest pain. °· You have excessive dizziness or fainting. °This information is not intended to replace advice given to you by your health care provider. Make sure you discuss any questions you have with your health care provider. °Document Released: 07/05/2013 Document Revised: 09/19/2013 Document Reviewed: 07/05/2013 °Elsevier Interactive Patient Education © 2017 [employer] Inc[employer] ° °

## 2016-11-16 NOTE — Op Note (Addendum)
Procedure: Ultrasound-guided right arm insertion of venous catheter deep brachial vein right central venogram  Preoperative diagnosis: End-stage renal disease new. Her postoperative diagnosis: Same  Anesthesia: Local  Operative findings: Patent right central venous structures with some luminal narrowing caused by pre-existing dialysis catheter no collateralization noted  Operative details: After obtaining informed consent, the patient for the PV lab. The patient was placed in supine position Angio table. Patient's antecubital region was prepped and draped in usual sterile fashion. Local anesthesia was infiltrated over a cephalic vein in the antecubital area. Several attempts were made to cannulate this using a micropuncture technique. However these were unsuccessful. At this point I changed my attention to a deep brachial vein and I was able to successfully cannulate this. The micro-puncture wire was threaded in the deep brachial vein and the micropuncture sheath placed over this. Several vials of blood were drawn through the micropuncture sheath to be sent to the lab for hypercoagulable profile. At this point a central venogram was performed by injecting contrast through the [employer] sheath. The superior vena cava is patent. The right innominate vein is patent. There is a central venous catheter which proceeds from the right internal jugular vein through the innominate vein and into the superior vena cava. This obstructs about 50% of the lumen. However there is brisk flow through the central venous structures suggesting no significant narrowing. The axillary vein is patent as well as the subclavian vein. The deep brachial vein is patent throughout its course in the upper arm. At this point the micropuncture sheath was removed and hemostasis obtained with direct pressure. The patient tolerated procedure well and there were no complications. Patient taken to the holding area in stable  condition.  Operative management: The patient will be scheduled for a right arm AV graft after confirmation that all of his clotting factors are normal. Our office will call to schedule this for him in the near future.  Ruta Hinds, MD [facility] [place] Office: [phone] Pager: [phone]

## 2016-11-16 NOTE — H&P (View-Only) (Signed)
Referring Physician: Dr Smith Mince  Patient name: [name] MRN: [id number] DOB: 1981-03-19 Sex: male  REASON FOR CONSULT: Hemodialysis access  HPI: [name] is a 36 y.o. male, sent for evaluation for placement of a long-term hemodialysis access. He previously had an attempt at a left forearm AV graft and left upper arm AV graft elsewhere. Both of these clotted in a very short time. He has had a right-sided catheter for 3 years. He currently dialyzes Monday Wednesday and Friday at [facility]. He denies any prior complications from his access procedures such as steal or venous hypertension. He states that this didn't work very long. He has never been worked up for hypercoagulable state. Other medical problems include diabetes and hypertension both of which are currently stable.  Past Medical History:  Diagnosis Date  . Diabetes mellitus without complication ([place])   . Dialysis patient ([place])   . Hypertension   . Renal disorder    Dialysis   Past Surgical History:  Procedure Laterality Date  . AV FISTULA PLACEMENT     left arm.  . INSERTION OF DIALYSIS CATHETER     Right subclavian    Family History  Problem Relation Age of Onset  . Diabetes Mellitus II      SOCIAL HISTORY: Social History   Social History  . Marital status: Single    Spouse name: N/A  . Number of children: N/A  . Years of education: N/A   Occupational History  . Not on file.   Social History Main Topics  . Smoking status: Never Smoker  . Smokeless tobacco: Never Used  . Alcohol use No  . Drug use: No  . Sexual activity: No   Other Topics Concern  . Not on file   Social History Narrative  . No narrative on file    No Known Allergies  Current Outpatient Prescriptions  Medication Sig Dispense Refill  . amLODipine (NORVASC) 10 MG tablet Take 10 mg by mouth daily.    . carvedilol (COREG) 25 MG tablet Take 25 mg by mouth 2 (two) times daily with a meal.    . HYDROcodone-acetaminophen  (NORCO/VICODIN) 5-325 MG per tablet Take 1 tablet by mouth every 6 (six) hours as needed for moderate pain. 20 tablet 0  . insulin aspart (NOVOLOG) 100 UNIT/ML injection Inject into the skin 3 (three) times daily before meals.    . insulin glargine (LANTUS) 100 UNIT/ML injection Inject 0.12 mLs (12 Units total) into the skin at bedtime. 10 mL 11  . spironolactone (ALDACTONE) 25 MG tablet Take 25 mg by mouth daily.    . valsartan (DIOVAN) 160 MG tablet Take 160 mg by mouth 2 (two) times daily.    . cloNIDine (CATAPRES) 0.3 MG tablet Take 0.3 mg by mouth 2 (two) times daily.    . insulin lispro (HUMALOG) 100 UNIT/ML injection Inject 8 Units into the skin 3 (three) times daily before meals.    . metoCLOPramide (REGLAN) 5 MG tablet Take 5 mg by mouth 3 (three) times daily before meals.    . minoxidil (LONITEN) 10 MG tablet Take 10 mg by mouth daily.    . pantoprazole (PROTONIX) 40 MG tablet Take 1 tablet (40 mg total) by mouth 2 (two) times daily. (Patient not taking: Reported on 11/12/2016) 60 tablet 0   No current facility-administered medications for this visit.     ROS:   General:  No weight loss, Fever, chills  HEENT: No recent headaches, no nasal  bleeding, no visual changes, no sore throat  Neurologic: No dizziness, blackouts, seizures. No recent symptoms of stroke or mini- stroke. No recent episodes of slurred speech, or temporary blindness.  Cardiac: No recent episodes of chest pain/pressure, no shortness of breath at rest.  No shortness of breath with exertion.  Denies history of atrial fibrillation or irregular heartbeat  Vascular: No history of rest pain in feet.  No history of claudication.  No history of non-healing ulcer, No history of DVT   Pulmonary: No home oxygen, no productive cough, no hemoptysis,  No asthma or wheezing  Musculoskeletal:  [ ]  Arthritis, [ ]  Low back pain,  [ ]  Joint pain  Hematologic:No history of hypercoagulable state.  No history of easy bleeding.  No  history of anemia  Gastrointestinal: No hematochezia or melena,  No gastroesophageal reflux, no trouble swallowing  Urinary: [ ]  xchronic Kidney disease, [X]  on HD - [X]  MWF or [ ]  TTHS, [ ]  Burning with urination, [ ]  Frequent urination, [ ]  Difficulty urinating;   Skin: No rashes  Psychological: No history of anxiety,  No history of depression   Physical Examination  Vitals:   11/12/16 0910  BP: (!) 126/91  Pulse: 79  Resp: 18  Temp: 98.6 F (37 C)  TempSrc: Oral  SpO2: 100%  Weight: 196 lb (88.9 kg)  Height: 5\' 6"  (1.676 m)    Body mass index is 31.64 kg/m.  General:  Alert and oriented, no acute distress HEENT: Normal Neck: No bruit or JVD, right-sided dialysis catheter Pulmonary: Clear to auscultation bilaterally Cardiac: Regular Rate and Rhythm without murmur Abdomen: Soft, non-tender, non-distended Skin: No rash Extremity Pulses:  2+ radial, brachial,pulses bilaterally occluded left forearm and upper arm graft Musculoskeletal: No deformity or edema  Neurologic: Upper and lower extremity motor 5/5 and symmetric  DATA:  Patient had a vein mapping ultrasound today which shows his cephalic and basilic veins are inadequate bilaterally for fistula creation. He also had an arterial duplex exam which are triphasic waveforms with a small radial artery less than 2 mm bilaterally. Brachial artery was 3.5 mm.  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 He has now had a catheter on the right side for 3 years. I believe the best option for him at this point would be to place a right arm AV graft. However we need to rule out central venous occlusion since his catheter has been in place along. I will schedule him for a right central venogram and place his venous catheter under ultrasound since he is a difficult stick for IVs. We have scheduled this for Monday, 11/15/2016 after dialysis. Risks benefits possible complications  and procedure details were explained to the patient today including but not limited to bleeding infection contrast reaction. He understands and agrees to proceed.   PLAN:  See above   Ruta Hinds, MD [facility] [place] Office: [phone] Pager: [phone]

## 2016-11-17 LAB — POCT I-STAT, CHEM 8
BUN: 33 mg/dL — ABNORMAL HIGH (ref 6–20)
Calcium, Ion: 1.04 mmol/L — ABNORMAL LOW (ref 1.15–1.40)
Chloride: 94 mmol/L — ABNORMAL LOW (ref 101–111)
Creatinine, Ser: 5.1 mg/dL — ABNORMAL HIGH (ref 0.61–1.24)
Glucose, Bld: 229 mg/dL — ABNORMAL HIGH (ref 65–99)
HCT: 36 % — ABNORMAL LOW (ref 39.0–52.0)
Hemoglobin: 12.2 g/dL — ABNORMAL LOW (ref 13.0–17.0)
Potassium: 4.7 mmol/L (ref 3.5–5.1)
Sodium: 131 mmol/L — ABNORMAL LOW (ref 135–145)
TCO2: 28 mmol/L (ref 0–100)

## 2016-11-17 LAB — BETA-2-GLYCOPROTEIN I ABS, IGG/M/A
Beta-2 Glyco I IgG: <9 GPI IgG units (ref 0–20)
Beta-2-Glycoprotein I IgA: <9 GPI IgA units (ref 0–25)
Beta-2-Glycoprotein I IgM: <9 GPI IgM units (ref 0–32)

## 2016-11-17 LAB — LUPUS ANTICOAGULANT PANEL
DRVVT: 37.4 s (ref 0.0–47.0)
PTT Lupus Anticoagulant: 36.6 s (ref 0.0–51.9)

## 2016-11-17 LAB — PROTEIN S ACTIVITY: Protein S Activity: 73 % (ref 63–140)

## 2016-11-17 LAB — PROTEIN C ACTIVITY: Protein C Activity: 126 % (ref 73–180)

## 2016-11-17 LAB — HOMOCYSTEINE: Homocysteine: 17.2 umol/L — ABNORMAL HIGH (ref 0.0–15.0)

## 2016-11-17 LAB — PROTEIN S, TOTAL: Protein S Ag, Total: 97 % (ref 60–150)

## 2016-11-18 DIAGNOSIS — N2581 Secondary hyperparathyroidism of renal origin: Secondary | ICD-10-CM | POA: Diagnosis not present

## 2016-11-18 DIAGNOSIS — D509 Iron deficiency anemia, unspecified: Secondary | ICD-10-CM | POA: Diagnosis not present

## 2016-11-18 DIAGNOSIS — E1129 Type 2 diabetes mellitus with other diabetic kidney complication: Secondary | ICD-10-CM | POA: Diagnosis not present

## 2016-11-18 DIAGNOSIS — N186 End stage renal disease: Secondary | ICD-10-CM | POA: Diagnosis not present

## 2016-11-18 LAB — CARDIOLIPIN ANTIBODIES, IGG, IGM, IGA
Anticardiolipin IgA: <9 APL U/mL (ref 0–11)
Anticardiolipin IgG: <9 GPL U/mL (ref 0–14)
Anticardiolipin IgM: <9 MPL U/mL (ref 0–12)

## 2016-11-18 LAB — PROTEIN C, TOTAL: Protein C, Total: 110 % (ref 60–150)

## 2016-11-19 ENCOUNTER — Other Ambulatory Visit: Payer: Self-pay

## 2016-11-19 ENCOUNTER — Ambulatory Visit (INDEPENDENT_AMBULATORY_CARE_PROVIDER_SITE_OTHER): Payer: Medicare Other | Admitting: Nurse Practitioner

## 2016-11-19 ENCOUNTER — Encounter: Payer: Self-pay | Admitting: Nurse Practitioner

## 2016-11-19 VITALS — BP 126/84 | HR 76 | Ht 66.0 in | Wt 198.0 lb

## 2016-11-19 DIAGNOSIS — R159 Full incontinence of feces: Secondary | ICD-10-CM

## 2016-11-19 DIAGNOSIS — E1021 Type 1 diabetes mellitus with diabetic nephropathy: Secondary | ICD-10-CM | POA: Diagnosis not present

## 2016-11-19 DIAGNOSIS — R197 Diarrhea, unspecified: Secondary | ICD-10-CM

## 2016-11-19 LAB — PROTHROMBIN GENE MUTATION

## 2016-11-19 NOTE — Progress Notes (Addendum)
HPI:  Patient is a 36 year old male referred by Nephrologist Dr. [name] [name] for diarrhea. Patient has DM1 and ESRD on HD. [name] has had intermittent nighttime diarrhea for years, though it was always no more than once a week. Over the last few months the diarrhea has been occurring every other night. He has no preceding cramps to awaken him and always wakes up incontinent of stool.  He has tried to make dietary changes. There is a definite relationship diarrhea and certain things such as green vegetables and dairy but other than that he doesn't know of any triggers. The nighttime incontinence has caused distress and embarrassment. He does not eat anything different in the evenings then he does for breakfast or lunch. If he has a BM during the day it is more formed. He has urgency but usually not incontinent if gets to a bathroom. He has been on Reglan 3 times daily before meals for a very long time. He does drink several diet sodas all day long.   Hemoglobin  11/04/16 12.4 Albumin 10/22/16 was 4.3.   Past Medical History:  Diagnosis Date  . Diabetes mellitus without complication ([name])   . Diabetic gastroparesis ([name])   . Dialysis patient ([name])   . Hypertension   . Renal disorder    Dialysis     Past Surgical History:  Procedure Laterality Date  . AV FISTULA PLACEMENT     left arm.  . INSERTION OF DIALYSIS CATHETER     Right subclavian  . UPPER EXTREMITY VENOGRAPHY N/A 11/16/2016   Procedure: Upper Extremity Venography - Right Central;  Surgeon: Shane Dutch, MD;  Location: [facility];  Service: Cardiovascular;  Laterality: N/A;   Family History  Problem Relation Age of Onset  . Diabetes Mellitus II     Social History  Substance Use Topics  . Smoking status: Never Smoker  . Smokeless tobacco: Never Used  . Alcohol use No   Current Outpatient Prescriptions  Medication Sig Dispense Refill  . amLODipine (NORVASC) 10 MG tablet Take 10 mg by mouth daily.    .  carvedilol (COREG) 25 MG tablet Take 25 mg by mouth 2 (two) times daily with a meal.    . cinacalcet (SENSIPAR) 30 MG tablet Take 30 mg by mouth daily with supper.    . insulin aspart (NOVOLOG) 100 UNIT/ML injection Inject 5-6 Units into the skin 3 (three) times daily before meals.     . insulin glargine (LANTUS) 100 UNIT/ML injection Inject 0.12 mLs (12 Units total) into the skin at bedtime. (Patient taking differently: Inject 7 Units into the skin at bedtime. ) 10 mL 11  . metoCLOPramide (REGLAN) 5 MG tablet Take 5 mg by mouth 3 (three) times daily before meals.    . sevelamer carbonate (RENVELA) 800 MG tablet Take 1,600-4,000 mg by mouth 3 (three) times daily with meals. Pt may take 2 to 5 tablets per meal    . spironolactone (ALDACTONE) 25 MG tablet Take 25 mg by mouth daily.    . valsartan (DIOVAN) 160 MG tablet Take 160 mg by mouth 2 (two) times daily.     No current facility-administered medications for this visit.    No Known Allergies   Review of Systems: All systems reviewed and negative except where noted in HPI.    Physical Exam: BP 126/84   Pulse 76   Ht 5\' 6"  (1.676 m)   Wt 198 lb (89.8 kg)   BMI 31.96  kg/m  Constitutional:  Well-developed, black male in no acute distress. Psychiatric: Pleasant, normal mood and affect. Behavior is normal. EENT: Pupils round.  Conjunctivae are normal. No scleral icterus. Neck supple.  Cardiovascular: Normal rate, regular rhythm.  Pulmonary/chest: Effort normal and breath sounds normal. No wheezing, rales or rhonchi. Abdominal: Soft, nondistended, nontender. Bowel sounds active throughout. There are no masses palpable. No hepatomegaly. Extremities: no edema Lymphadenopathy: No cervical adenopathy noted. Neurological: Alert and oriented to person place and time. Skin: Skin is warm and dry. No rashes noted.   ASSESSMENT AND PLAN:   Very pleasant 36 yo black male with long standing type 1 diabetes and ESRD on HD. He has chronic  nocturnal loose stools and presents now with worsening nocturnal diarrhea associated with incontinence. Doubt neuropathy of anal sphincter though he probably has decreased rectal sensation.  -check stool for c-diff.  -He is hesitant to stop diet sodas but agrees to stop them by 5pm each day until things can be figured out -Hold evening dose of Reglan for now -Low FODMAP brochure to -Given longstanding diabetes he could have some degree of pancreatic insuff which can lead to diarrhea. Will check fecal elastase -Will eventually add imodium or lomotil in the evening to decrease nocturnal diarrhea -Patient will return to see me in 3 weeks, or sooner if need be  Shane Savoy, NP  11/19/2016, 9:47 AM  Cc:  Shane Area, MD  Agree with Ms. [name]'s assessment and plan.  Conservative approach makes sense. If not better will need rectal exam to do cursory assessment of ano-rectal function. Colonoscopy could be needed. Ano-rectal manometry could be needed. Shane Mayer, MD, [name]

## 2016-11-19 NOTE — Patient Instructions (Signed)
Your physician has requested that you go to the basement for the following lab work before leaving today: Stool for C Diff, lactoferrin, fecal elastace  Please do NOT take your evening dose of Reglan.  Do NOT have any dairy or diet sodas after 5 pm.  Please look over and follow the FODMAP diet we have given you.  If you are age 38 or older, your body mass index should be between 23-30. Your Body mass index is 31.96 kg/m. If this is out of the aforementioned range listed, please consider follow up with your Primary Care Provider.  If you are age 91 or younger, your body mass index should be between 19-25. Your Body mass index is 31.96 kg/m. If this is out of the aformentioned range listed, please consider follow up with your Primary Care Provider.

## 2016-11-20 DIAGNOSIS — D509 Iron deficiency anemia, unspecified: Secondary | ICD-10-CM | POA: Diagnosis not present

## 2016-11-20 DIAGNOSIS — E1129 Type 2 diabetes mellitus with other diabetic kidney complication: Secondary | ICD-10-CM | POA: Diagnosis not present

## 2016-11-20 DIAGNOSIS — N186 End stage renal disease: Secondary | ICD-10-CM | POA: Diagnosis not present

## 2016-11-20 DIAGNOSIS — N2581 Secondary hyperparathyroidism of renal origin: Secondary | ICD-10-CM | POA: Diagnosis not present

## 2016-11-20 LAB — FACTOR 5 LEIDEN

## 2016-11-23 DIAGNOSIS — D509 Iron deficiency anemia, unspecified: Secondary | ICD-10-CM | POA: Diagnosis not present

## 2016-11-23 DIAGNOSIS — N2581 Secondary hyperparathyroidism of renal origin: Secondary | ICD-10-CM | POA: Diagnosis not present

## 2016-11-23 DIAGNOSIS — N186 End stage renal disease: Secondary | ICD-10-CM | POA: Diagnosis not present

## 2016-11-23 DIAGNOSIS — E1129 Type 2 diabetes mellitus with other diabetic kidney complication: Secondary | ICD-10-CM | POA: Diagnosis not present

## 2016-11-25 ENCOUNTER — Other Ambulatory Visit: Payer: Medicare Other

## 2016-11-25 DIAGNOSIS — R197 Diarrhea, unspecified: Secondary | ICD-10-CM | POA: Diagnosis not present

## 2016-11-25 DIAGNOSIS — N2581 Secondary hyperparathyroidism of renal origin: Secondary | ICD-10-CM | POA: Diagnosis not present

## 2016-11-25 DIAGNOSIS — E1021 Type 1 diabetes mellitus with diabetic nephropathy: Secondary | ICD-10-CM | POA: Diagnosis not present

## 2016-11-25 DIAGNOSIS — N186 End stage renal disease: Secondary | ICD-10-CM | POA: Diagnosis not present

## 2016-11-25 DIAGNOSIS — R159 Full incontinence of feces: Secondary | ICD-10-CM | POA: Diagnosis not present

## 2016-11-25 DIAGNOSIS — E1129 Type 2 diabetes mellitus with other diabetic kidney complication: Secondary | ICD-10-CM | POA: Diagnosis not present

## 2016-11-25 DIAGNOSIS — D509 Iron deficiency anemia, unspecified: Secondary | ICD-10-CM | POA: Diagnosis not present

## 2016-11-25 DIAGNOSIS — Z992 Dependence on renal dialysis: Secondary | ICD-10-CM | POA: Diagnosis not present

## 2016-11-25 DIAGNOSIS — I129 Hypertensive chronic kidney disease with stage 1 through stage 4 chronic kidney disease, or unspecified chronic kidney disease: Secondary | ICD-10-CM | POA: Diagnosis not present

## 2016-11-26 LAB — CLOSTRIDIUM DIFFICILE BY PCR: Toxigenic C. Difficile by PCR: NOT DETECTED

## 2016-11-26 LAB — FECAL LACTOFERRIN, QUANT: Lactoferrin: POSITIVE

## 2016-11-27 DIAGNOSIS — E1129 Type 2 diabetes mellitus with other diabetic kidney complication: Secondary | ICD-10-CM | POA: Diagnosis not present

## 2016-11-27 DIAGNOSIS — D509 Iron deficiency anemia, unspecified: Secondary | ICD-10-CM | POA: Diagnosis not present

## 2016-11-27 DIAGNOSIS — R739 Hyperglycemia, unspecified: Secondary | ICD-10-CM | POA: Diagnosis not present

## 2016-11-27 DIAGNOSIS — N186 End stage renal disease: Secondary | ICD-10-CM | POA: Diagnosis not present

## 2016-11-27 DIAGNOSIS — N2581 Secondary hyperparathyroidism of renal origin: Secondary | ICD-10-CM | POA: Diagnosis not present

## 2016-11-27 DIAGNOSIS — D631 Anemia in chronic kidney disease: Secondary | ICD-10-CM | POA: Diagnosis not present

## 2016-11-30 DIAGNOSIS — N2581 Secondary hyperparathyroidism of renal origin: Secondary | ICD-10-CM | POA: Diagnosis not present

## 2016-11-30 DIAGNOSIS — R739 Hyperglycemia, unspecified: Secondary | ICD-10-CM | POA: Diagnosis not present

## 2016-11-30 DIAGNOSIS — D631 Anemia in chronic kidney disease: Secondary | ICD-10-CM | POA: Diagnosis not present

## 2016-11-30 DIAGNOSIS — E1129 Type 2 diabetes mellitus with other diabetic kidney complication: Secondary | ICD-10-CM | POA: Diagnosis not present

## 2016-11-30 DIAGNOSIS — D509 Iron deficiency anemia, unspecified: Secondary | ICD-10-CM | POA: Diagnosis not present

## 2016-11-30 DIAGNOSIS — N186 End stage renal disease: Secondary | ICD-10-CM | POA: Diagnosis not present

## 2016-12-01 LAB — PANCREATIC ELASTASE, FECAL: Pancreatic Elastase-1, Stool: >500 mcg/g

## 2016-12-02 DIAGNOSIS — N186 End stage renal disease: Secondary | ICD-10-CM | POA: Diagnosis not present

## 2016-12-02 DIAGNOSIS — N2581 Secondary hyperparathyroidism of renal origin: Secondary | ICD-10-CM | POA: Diagnosis not present

## 2016-12-02 DIAGNOSIS — R739 Hyperglycemia, unspecified: Secondary | ICD-10-CM | POA: Diagnosis not present

## 2016-12-02 DIAGNOSIS — D509 Iron deficiency anemia, unspecified: Secondary | ICD-10-CM | POA: Diagnosis not present

## 2016-12-02 DIAGNOSIS — D631 Anemia in chronic kidney disease: Secondary | ICD-10-CM | POA: Diagnosis not present

## 2016-12-02 DIAGNOSIS — E1129 Type 2 diabetes mellitus with other diabetic kidney complication: Secondary | ICD-10-CM | POA: Diagnosis not present

## 2016-12-04 ENCOUNTER — Encounter (HOSPITAL_COMMUNITY): Payer: Self-pay | Admitting: *Deleted

## 2016-12-04 DIAGNOSIS — R739 Hyperglycemia, unspecified: Secondary | ICD-10-CM | POA: Diagnosis not present

## 2016-12-04 DIAGNOSIS — E1129 Type 2 diabetes mellitus with other diabetic kidney complication: Secondary | ICD-10-CM | POA: Diagnosis not present

## 2016-12-04 DIAGNOSIS — N186 End stage renal disease: Secondary | ICD-10-CM | POA: Diagnosis not present

## 2016-12-04 DIAGNOSIS — N2581 Secondary hyperparathyroidism of renal origin: Secondary | ICD-10-CM | POA: Diagnosis not present

## 2016-12-04 DIAGNOSIS — D631 Anemia in chronic kidney disease: Secondary | ICD-10-CM | POA: Diagnosis not present

## 2016-12-04 DIAGNOSIS — D509 Iron deficiency anemia, unspecified: Secondary | ICD-10-CM | POA: Diagnosis not present

## 2016-12-04 NOTE — Progress Notes (Signed)
Pt denies SOB, chest pain, and being under the care of a cardiologist. Pt denies having a stress test and cardiac cath performed. Pt stated that MD advised him on what medications to take pre-operatively, including insulin. Pt made aware of diabetes protocol to check BG every 2 hours prior to arrival to hospital, interventions for BG < 70 ( 4 ounces of apple or cranberry juice, or 4 glucose tabs or glucose gel), wait 15 minutes after intervention and recheck BG, if BG remains < 70 call the SS unit). Pt made aware to stop taking vitamins, fish oil, herbal medications and NSAID's. Pt verbalized understanding of all pre-op instructions.

## 2016-12-07 DIAGNOSIS — D631 Anemia in chronic kidney disease: Secondary | ICD-10-CM | POA: Diagnosis not present

## 2016-12-07 DIAGNOSIS — E1129 Type 2 diabetes mellitus with other diabetic kidney complication: Secondary | ICD-10-CM | POA: Diagnosis not present

## 2016-12-07 DIAGNOSIS — R739 Hyperglycemia, unspecified: Secondary | ICD-10-CM | POA: Diagnosis not present

## 2016-12-07 DIAGNOSIS — D509 Iron deficiency anemia, unspecified: Secondary | ICD-10-CM | POA: Diagnosis not present

## 2016-12-07 DIAGNOSIS — N186 End stage renal disease: Secondary | ICD-10-CM | POA: Diagnosis not present

## 2016-12-07 DIAGNOSIS — N2581 Secondary hyperparathyroidism of renal origin: Secondary | ICD-10-CM | POA: Diagnosis not present

## 2016-12-08 ENCOUNTER — Other Ambulatory Visit: Payer: Self-pay

## 2016-12-08 ENCOUNTER — Ambulatory Visit (HOSPITAL_COMMUNITY)
Admission: RE | Admit: 2016-12-08 | Discharge: 2016-12-08 | Disposition: A | Discharge status: Home / Self Care | Payer: Medicare Other | Source: Ambulatory Visit | Attending: Vascular Surgery | Admitting: Vascular Surgery

## 2016-12-08 ENCOUNTER — Encounter (HOSPITAL_COMMUNITY): Payer: Medicare Other

## 2016-12-08 ENCOUNTER — Encounter (HOSPITAL_COMMUNITY)
Admission: RE | Disposition: A | Discharge status: Home / Self Care | Payer: Self-pay | Source: Ambulatory Visit | Attending: Vascular Surgery

## 2016-12-08 ENCOUNTER — Encounter (HOSPITAL_COMMUNITY): Payer: Self-pay | Admitting: Certified Registered Nurse Anesthetist

## 2016-12-08 ENCOUNTER — Other Ambulatory Visit (HOSPITAL_COMMUNITY): Payer: Medicare Other

## 2016-12-08 ENCOUNTER — Encounter: Payer: Medicare Other | Admitting: Vascular Surgery

## 2016-12-08 ENCOUNTER — Encounter (HOSPITAL_COMMUNITY): Payer: Self-pay | Admitting: *Deleted

## 2016-12-08 DIAGNOSIS — Z79899 Other long term (current) drug therapy: Secondary | ICD-10-CM | POA: Insufficient documentation

## 2016-12-08 DIAGNOSIS — Z992 Dependence on renal dialysis: Secondary | ICD-10-CM | POA: Diagnosis not present

## 2016-12-08 DIAGNOSIS — N186 End stage renal disease: Secondary | ICD-10-CM | POA: Diagnosis not present

## 2016-12-08 DIAGNOSIS — I120 Hypertensive chronic kidney disease with stage 5 chronic kidney disease or end stage renal disease: Secondary | ICD-10-CM | POA: Diagnosis not present

## 2016-12-08 DIAGNOSIS — Z794 Long term (current) use of insulin: Secondary | ICD-10-CM | POA: Insufficient documentation

## 2016-12-08 DIAGNOSIS — E1122 Type 2 diabetes mellitus with diabetic chronic kidney disease: Secondary | ICD-10-CM | POA: Insufficient documentation

## 2016-12-08 DIAGNOSIS — Z5309 Procedure and treatment not carried out because of other contraindication: Secondary | ICD-10-CM | POA: Insufficient documentation

## 2016-12-08 LAB — POCT I-STAT 4, (NA,K, GLUC, HGB,HCT)
Glucose, Bld: 385 mg/dL — ABNORMAL HIGH (ref 65–99)
HCT: 29 % — ABNORMAL LOW (ref 39.0–52.0)
Hemoglobin: 9.9 g/dL — ABNORMAL LOW (ref 13.0–17.0)
Potassium: 4.2 mmol/L (ref 3.5–5.1)
Sodium: 134 mmol/L — ABNORMAL LOW (ref 135–145)

## 2016-12-08 LAB — GLUCOSE, CAPILLARY
Glucose-Capillary: 436 mg/dL — ABNORMAL HIGH (ref 65–99)
Glucose-Capillary: 400 mg/dL — ABNORMAL HIGH (ref 65–99)
Glucose-Capillary: 257 mg/dL — ABNORMAL HIGH (ref 65–99)

## 2016-12-08 SURGERY — INSERTION OF ARTERIOVENOUS (AV) GORE-TEX GRAFT ARM
Anesthesia: Choice | Site: Arm Upper | Laterality: Right

## 2016-12-08 MED ORDER — LIDOCAINE HCL (PF) 1 % IJ SOLN
INTRAMUSCULAR | Status: AC
  Filled 2016-12-08: qty 30

## 2016-12-08 MED ORDER — FENTANYL CITRATE (PF) 100 MCG/2ML IJ SOLN
INTRAMUSCULAR | Status: AC
  Filled 2016-12-08: qty 2

## 2016-12-08 MED ORDER — INSULIN ASPART 100 UNIT/ML ~~LOC~~ SOLN: 0.0000 [IU] | Freq: Three times a day (TID) | SUBCUTANEOUS | Status: DC

## 2016-12-08 MED ORDER — THROMBIN 20000 UNITS EX KIT
PACK | CUTANEOUS | Status: AC
  Filled 2016-12-08: qty 1

## 2016-12-08 MED ORDER — SODIUM CHLORIDE 0.9 % IV SOLN: INTRAVENOUS | Status: DC

## 2016-12-08 MED ORDER — MIDAZOLAM HCL 2 MG/2ML IJ SOLN
INTRAMUSCULAR | Status: AC
  Filled 2016-12-08: qty 2

## 2016-12-08 MED ORDER — DEXTROSE 5 % IV SOLN
1.5000 g | INTRAVENOUS | Status: DC
  Filled 2016-12-08 (×2): qty 1.5

## 2016-12-08 MED ORDER — INSULIN ASPART 100 UNIT/ML ~~LOC~~ SOLN
SUBCUTANEOUS | Status: AC
  Filled 2016-12-08: qty 1

## 2016-12-08 MED ORDER — INSULIN ASPART 100 UNIT/ML ~~LOC~~ SOLN
10.0000 [IU] | Freq: Once | SUBCUTANEOUS | Status: AC
  Administered 2016-12-08: 10 [IU] via SUBCUTANEOUS

## 2016-12-08 MED ORDER — CHLORHEXIDINE GLUCONATE CLOTH 2 % EX PADS: 6.0000 | MEDICATED_PAD | Freq: Once | CUTANEOUS | Status: DC

## 2016-12-08 MED ORDER — PROPOFOL 10 MG/ML IV BOLUS
INTRAVENOUS | Status: AC
  Filled 2016-12-08: qty 20

## 2016-12-08 MED ORDER — INSULIN ASPART 100 UNIT/ML ~~LOC~~ SOLN
15.0000 [IU] | Freq: Once | SUBCUTANEOUS | Status: AC
  Administered 2016-12-08: 15 [IU] via SUBCUTANEOUS

## 2016-12-08 NOTE — Progress Notes (Signed)
cbg 385 this am Dr Tobias [name] called and informed States to let Dr Nyoka Cowden know when she comes in.

## 2016-12-08 NOTE — Progress Notes (Signed)
CBG 257 Dr Oneida Alar informed states ok to discharge him home.

## 2016-12-08 NOTE — Progress Notes (Signed)
Dr Oneida Alar called and informed of Pt eating at 0100 and CBG today. Informed that she will not be able to proceeded until 0900 with the surgery. Dr Oneida Alar informed. States he is on his way

## 2016-12-08 NOTE — Progress Notes (Signed)
Dr Nyoka Cowden informed of CBG and that the patient ate and drank at 0100, states to let Dr Oneida Alar know.

## 2016-12-08 NOTE — Progress Notes (Signed)
Dr Lottie Rater in to see pt encourage pt to stay and let us recheck his CBG. Pt states I will stay for 30 mins.

## 2016-12-08 NOTE — Progress Notes (Addendum)
Dr Oneida Alar called and asked what range we need CBG to be in before d/c pt home. States he normally runs 200-250 so with in that range. Given 15 units and recheck 1  hour and follow  Meal coverage sliding scale as ordered.

## 2016-12-08 NOTE — Progress Notes (Signed)
Pt ate full meal at around 3 am this morning.  Currently glucose 385-400 Will treat his glucose into the 200s Will need to reschedule since he ate  Ruta Hinds, MD [facility] [place]: [phone] Pager: [phone]

## 2016-12-08 NOTE — Progress Notes (Signed)
Dr Nyoka Cowden called and informed of pt is being cancelled.

## 2016-12-08 NOTE — Progress Notes (Addendum)
Pt states his blood sugar dropped around 1245- 0100 and he ate a peanut butter sandwich and drank  8oz  of soda. States his blood sugar was 27 at that time.

## 2016-12-08 NOTE — Progress Notes (Signed)
Dr Oneida Alar called per pt request. Pt is asking to go home and that we given only 10 units now instead of 15. New orders noted to give 10 units now and recheck the blood sugar if still 400 have pt go to the ED. Informed pt of this. States he wants to leave. Dr Lottie Rater called pt back and states he is coming to see the pt.

## 2016-12-09 DIAGNOSIS — E1129 Type 2 diabetes mellitus with other diabetic kidney complication: Secondary | ICD-10-CM | POA: Diagnosis not present

## 2016-12-09 DIAGNOSIS — N186 End stage renal disease: Secondary | ICD-10-CM | POA: Diagnosis not present

## 2016-12-09 DIAGNOSIS — D509 Iron deficiency anemia, unspecified: Secondary | ICD-10-CM | POA: Diagnosis not present

## 2016-12-09 DIAGNOSIS — D631 Anemia in chronic kidney disease: Secondary | ICD-10-CM | POA: Diagnosis not present

## 2016-12-09 DIAGNOSIS — N2581 Secondary hyperparathyroidism of renal origin: Secondary | ICD-10-CM | POA: Diagnosis not present

## 2016-12-09 DIAGNOSIS — R739 Hyperglycemia, unspecified: Secondary | ICD-10-CM | POA: Diagnosis not present

## 2016-12-10 ENCOUNTER — Ambulatory Visit: Payer: Medicare Other | Admitting: Nurse Practitioner

## 2016-12-11 DIAGNOSIS — N2581 Secondary hyperparathyroidism of renal origin: Secondary | ICD-10-CM | POA: Diagnosis not present

## 2016-12-11 DIAGNOSIS — D631 Anemia in chronic kidney disease: Secondary | ICD-10-CM | POA: Diagnosis not present

## 2016-12-11 DIAGNOSIS — E1129 Type 2 diabetes mellitus with other diabetic kidney complication: Secondary | ICD-10-CM | POA: Diagnosis not present

## 2016-12-11 DIAGNOSIS — N186 End stage renal disease: Secondary | ICD-10-CM | POA: Diagnosis not present

## 2016-12-11 DIAGNOSIS — R739 Hyperglycemia, unspecified: Secondary | ICD-10-CM | POA: Diagnosis not present

## 2016-12-11 DIAGNOSIS — D509 Iron deficiency anemia, unspecified: Secondary | ICD-10-CM | POA: Diagnosis not present

## 2016-12-14 DIAGNOSIS — N186 End stage renal disease: Secondary | ICD-10-CM | POA: Diagnosis not present

## 2016-12-14 DIAGNOSIS — N2581 Secondary hyperparathyroidism of renal origin: Secondary | ICD-10-CM | POA: Diagnosis not present

## 2016-12-14 DIAGNOSIS — R739 Hyperglycemia, unspecified: Secondary | ICD-10-CM | POA: Diagnosis not present

## 2016-12-14 DIAGNOSIS — D631 Anemia in chronic kidney disease: Secondary | ICD-10-CM | POA: Diagnosis not present

## 2016-12-14 DIAGNOSIS — D509 Iron deficiency anemia, unspecified: Secondary | ICD-10-CM | POA: Diagnosis not present

## 2016-12-14 DIAGNOSIS — E1129 Type 2 diabetes mellitus with other diabetic kidney complication: Secondary | ICD-10-CM | POA: Diagnosis not present

## 2016-12-16 DIAGNOSIS — E1129 Type 2 diabetes mellitus with other diabetic kidney complication: Secondary | ICD-10-CM | POA: Diagnosis not present

## 2016-12-16 DIAGNOSIS — N2581 Secondary hyperparathyroidism of renal origin: Secondary | ICD-10-CM | POA: Diagnosis not present

## 2016-12-16 DIAGNOSIS — D631 Anemia in chronic kidney disease: Secondary | ICD-10-CM | POA: Diagnosis not present

## 2016-12-16 DIAGNOSIS — D509 Iron deficiency anemia, unspecified: Secondary | ICD-10-CM | POA: Diagnosis not present

## 2016-12-16 DIAGNOSIS — R739 Hyperglycemia, unspecified: Secondary | ICD-10-CM | POA: Diagnosis not present

## 2016-12-16 DIAGNOSIS — N186 End stage renal disease: Secondary | ICD-10-CM | POA: Diagnosis not present

## 2016-12-18 DIAGNOSIS — D631 Anemia in chronic kidney disease: Secondary | ICD-10-CM | POA: Diagnosis not present

## 2016-12-18 DIAGNOSIS — E1129 Type 2 diabetes mellitus with other diabetic kidney complication: Secondary | ICD-10-CM | POA: Diagnosis not present

## 2016-12-18 DIAGNOSIS — N186 End stage renal disease: Secondary | ICD-10-CM | POA: Diagnosis not present

## 2016-12-18 DIAGNOSIS — D509 Iron deficiency anemia, unspecified: Secondary | ICD-10-CM | POA: Diagnosis not present

## 2016-12-18 DIAGNOSIS — N2581 Secondary hyperparathyroidism of renal origin: Secondary | ICD-10-CM | POA: Diagnosis not present

## 2016-12-18 DIAGNOSIS — R739 Hyperglycemia, unspecified: Secondary | ICD-10-CM | POA: Diagnosis not present

## 2016-12-21 ENCOUNTER — Encounter (HOSPITAL_COMMUNITY): Payer: Self-pay | Admitting: *Deleted

## 2016-12-21 DIAGNOSIS — D509 Iron deficiency anemia, unspecified: Secondary | ICD-10-CM | POA: Diagnosis not present

## 2016-12-21 DIAGNOSIS — N186 End stage renal disease: Secondary | ICD-10-CM | POA: Diagnosis not present

## 2016-12-21 DIAGNOSIS — N2581 Secondary hyperparathyroidism of renal origin: Secondary | ICD-10-CM | POA: Diagnosis not present

## 2016-12-21 DIAGNOSIS — R739 Hyperglycemia, unspecified: Secondary | ICD-10-CM | POA: Diagnosis not present

## 2016-12-21 DIAGNOSIS — E1129 Type 2 diabetes mellitus with other diabetic kidney complication: Secondary | ICD-10-CM | POA: Diagnosis not present

## 2016-12-21 DIAGNOSIS — D631 Anemia in chronic kidney disease: Secondary | ICD-10-CM | POA: Diagnosis not present

## 2016-12-21 MED ORDER — DEXTROSE 5 % IV SOLN
1.5000 g | INTRAVENOUS | Status: AC
  Administered 2016-12-22: 1.5 g via INTRAVENOUS
  Filled 2016-12-21: qty 1.5

## 2016-12-21 MED ORDER — SODIUM CHLORIDE 0.9 % IV SOLN
INTRAVENOUS | Status: DC
  Administered 2016-12-22 (×2): via INTRAVENOUS

## 2016-12-21 NOTE — Progress Notes (Signed)
Pt made aware to take 4 units of Lantus Insulin tonight instead of the usual 6 units. No insulin DOS. Pt verbalized understanding of all pre-op instructions.

## 2016-12-21 NOTE — Anesthesia Preprocedure Evaluation (Addendum)
Anesthesia Evaluation  Patient identified by MRN, date of birth, ID band Patient awake    Reviewed: Allergy & Precautions, NPO status , Patient's Chart, lab work & pertinent test results, reviewed documented beta blocker date and time   Airway Mallampati: III       Dental no notable dental hx. (+) Teeth Intact, Poor Dentition   Pulmonary neg pulmonary ROS,    Pulmonary exam normal breath sounds clear to auscultation       Cardiovascular hypertension, Pt. on medications and Pt. on home beta blockers Normal cardiovascular exam Rhythm:Regular Rate:Normal     Neuro/Psych negative neurological ROS  negative psych ROS   GI/Hepatic negative GI ROS, Neg liver ROS,   Endo/Other  diabetes, Poorly Controlled, Type 2  Renal/GU ESRF and DialysisRenal disease  negative genitourinary   Musculoskeletal negative musculoskeletal ROS (+)   Abdominal (+) + obese,   Peds  Hematology  (+) anemia ,   Anesthesia Other Findings   Reproductive/Obstetrics                            Anesthesia Physical Anesthesia Plan  ASA: IV  Anesthesia Plan: MAC   Post-op Pain Management:    Induction:   Airway Management Planned: Natural Airway  Additional Equipment:   Intra-op Plan:   Post-operative Plan:   Informed Consent: I have reviewed the patients History and Physical, chart, labs and discussed the procedure including the risks, benefits and alternatives for the proposed anesthesia with the patient or authorized representative who has indicated his/her understanding and acceptance.   Dental advisory given  Plan Discussed with: CRNA, Anesthesiologist and Surgeon  Anesthesia Plan Comments:        Anesthesia Quick Evaluation

## 2016-12-21 NOTE — Progress Notes (Signed)
Pt denies SOB, chest pain, and being under the care of a cardiologist. Pt denies having a stress test and cardiac cath performed.  Pt made aware of diabetes protocol to check BG every 2 hours prior to arrival to hospital, interventions for BG < 70 ( 4 ounces of apple or cranberry juice, or 4 glucose tabs or glucose gel), wait 15 minutes after intervention and recheck BG, if BG remains < 70 call the SS unit). Pt made aware to stop taking vitamins, fish oil, herbal medications and NSAID's. Pt verbalized understanding of all pre-op instructions.

## 2016-12-22 ENCOUNTER — Encounter (HOSPITAL_COMMUNITY)
Admission: RE | Disposition: A | Discharge status: Home / Self Care | Payer: Self-pay | Source: Ambulatory Visit | Attending: Vascular Surgery

## 2016-12-22 ENCOUNTER — Ambulatory Visit (HOSPITAL_COMMUNITY): Payer: Medicare Other | Admitting: Anesthesiology

## 2016-12-22 ENCOUNTER — Ambulatory Visit (HOSPITAL_COMMUNITY)
Admission: RE | Admit: 2016-12-22 | Discharge: 2016-12-22 | Disposition: A | Discharge status: Home / Self Care | Payer: Medicare Other | Source: Ambulatory Visit | Attending: Vascular Surgery | Admitting: Vascular Surgery

## 2016-12-22 ENCOUNTER — Encounter (HOSPITAL_COMMUNITY): Payer: Self-pay | Admitting: Urology

## 2016-12-22 DIAGNOSIS — N186 End stage renal disease: Secondary | ICD-10-CM | POA: Diagnosis not present

## 2016-12-22 DIAGNOSIS — Z833 Family history of diabetes mellitus: Secondary | ICD-10-CM | POA: Insufficient documentation

## 2016-12-22 DIAGNOSIS — Z794 Long term (current) use of insulin: Secondary | ICD-10-CM | POA: Insufficient documentation

## 2016-12-22 DIAGNOSIS — E669 Obesity, unspecified: Secondary | ICD-10-CM | POA: Diagnosis not present

## 2016-12-22 DIAGNOSIS — Z6831 Body mass index (BMI) 31.0-31.9, adult: Secondary | ICD-10-CM | POA: Insufficient documentation

## 2016-12-22 DIAGNOSIS — N185 Chronic kidney disease, stage 5: Secondary | ICD-10-CM | POA: Diagnosis not present

## 2016-12-22 DIAGNOSIS — Z79899 Other long term (current) drug therapy: Secondary | ICD-10-CM | POA: Insufficient documentation

## 2016-12-22 DIAGNOSIS — D631 Anemia in chronic kidney disease: Secondary | ICD-10-CM | POA: Diagnosis not present

## 2016-12-22 DIAGNOSIS — D649 Anemia, unspecified: Secondary | ICD-10-CM | POA: Diagnosis not present

## 2016-12-22 DIAGNOSIS — E1122 Type 2 diabetes mellitus with diabetic chronic kidney disease: Secondary | ICD-10-CM | POA: Insufficient documentation

## 2016-12-22 DIAGNOSIS — Z992 Dependence on renal dialysis: Secondary | ICD-10-CM | POA: Insufficient documentation

## 2016-12-22 DIAGNOSIS — I120 Hypertensive chronic kidney disease with stage 5 chronic kidney disease or end stage renal disease: Secondary | ICD-10-CM | POA: Diagnosis not present

## 2016-12-22 DIAGNOSIS — E119 Type 2 diabetes mellitus without complications: Secondary | ICD-10-CM | POA: Diagnosis not present

## 2016-12-22 HISTORY — PX: AV FISTULA PLACEMENT: SHX1204

## 2016-12-22 LAB — GLUCOSE, CAPILLARY
Glucose-Capillary: 352 mg/dL — ABNORMAL HIGH (ref 65–99)
Glucose-Capillary: 373 mg/dL — ABNORMAL HIGH (ref 65–99)
Glucose-Capillary: 380 mg/dL — ABNORMAL HIGH (ref 65–99)
Glucose-Capillary: 270 mg/dL — ABNORMAL HIGH (ref 65–99)
Glucose-Capillary: 295 mg/dL — ABNORMAL HIGH (ref 65–99)

## 2016-12-22 LAB — POCT I-STAT 4, (NA,K, GLUC, HGB,HCT)
Glucose, Bld: 165 mg/dL — ABNORMAL HIGH (ref 65–99)
HCT: 29 % — ABNORMAL LOW (ref 39.0–52.0)
Hemoglobin: 9.9 g/dL — ABNORMAL LOW (ref 13.0–17.0)
Potassium: 4 mmol/L (ref 3.5–5.1)
Sodium: 134 mmol/L — ABNORMAL LOW (ref 135–145)

## 2016-12-22 SURGERY — INSERTION OF ARTERIOVENOUS (AV) GORE-TEX GRAFT ARM
Anesthesia: Monitor Anesthesia Care | Site: Arm Upper | Laterality: Right

## 2016-12-22 MED ORDER — MIDAZOLAM HCL 5 MG/5ML IJ SOLN
INTRAMUSCULAR | Status: DC | PRN
  Administered 2016-12-22: 2 mg via INTRAVENOUS

## 2016-12-22 MED ORDER — INSULIN ASPART 100 UNIT/ML ~~LOC~~ SOLN
SUBCUTANEOUS | Status: AC
  Administered 2016-12-22: 12 [IU]
  Filled 2016-12-22: qty 1

## 2016-12-22 MED ORDER — INSULIN ASPART 100 UNIT/ML ~~LOC~~ SOLN
7.0000 [IU] | Freq: Once | SUBCUTANEOUS | Status: AC
  Administered 2016-12-22: 7 [IU] via SUBCUTANEOUS

## 2016-12-22 MED ORDER — FENTANYL CITRATE (PF) 100 MCG/2ML IJ SOLN
INTRAMUSCULAR | Status: DC | PRN
  Administered 2016-12-22 (×2): 50 ug via INTRAVENOUS

## 2016-12-22 MED ORDER — INSULIN ASPART 100 UNIT/ML ~~LOC~~ SOLN
SUBCUTANEOUS | Status: DC
Start: 2016-12-22 — End: 2016-12-22
  Filled 2016-12-22: qty 1

## 2016-12-22 MED ORDER — CHLORHEXIDINE GLUCONATE CLOTH 2 % EX PADS: 6.0000 | MEDICATED_PAD | Freq: Once | CUTANEOUS | Status: DC

## 2016-12-22 MED ORDER — HEPARIN SODIUM (PORCINE) 1000 UNIT/ML IJ SOLN
INTRAMUSCULAR | Status: DC | PRN
  Administered 2016-12-22: 7000 [IU] via INTRAVENOUS

## 2016-12-22 MED ORDER — PROPOFOL 10 MG/ML IV BOLUS
INTRAVENOUS | Status: AC
  Filled 2016-12-22: qty 20

## 2016-12-22 MED ORDER — LIDOCAINE HCL (CARDIAC) 20 MG/ML IV SOLN
INTRAVENOUS | Status: DC | PRN
  Administered 2016-12-22: 60 mg via INTRATRACHEAL

## 2016-12-22 MED ORDER — 0.9 % SODIUM CHLORIDE (POUR BTL) OPTIME
TOPICAL | Status: DC | PRN
  Administered 2016-12-22: 1000 mL

## 2016-12-22 MED ORDER — LIDOCAINE HCL (PF) 1 % IJ SOLN
INTRAMUSCULAR | Status: DC | PRN
  Administered 2016-12-22: 9 mL
  Administered 2016-12-22: 30 mL

## 2016-12-22 MED ORDER — INSULIN ASPART 100 UNIT/ML ~~LOC~~ SOLN: 12.0000 [IU] | Freq: Once | SUBCUTANEOUS | Status: DC

## 2016-12-22 MED ORDER — INSULIN ASPART 100 UNIT/ML ~~LOC~~ SOLN
SUBCUTANEOUS | Status: AC
  Filled 2016-12-22: qty 1

## 2016-12-22 MED ORDER — LIDOCAINE HCL (PF) 1 % IJ SOLN
INTRAMUSCULAR | Status: AC
  Filled 2016-12-22: qty 30

## 2016-12-22 MED ORDER — SODIUM CHLORIDE 0.9 % IV SOLN
INTRAVENOUS | Status: DC | PRN
  Administered 2016-12-22: 07:00:00 500 mL

## 2016-12-22 MED ORDER — MIDAZOLAM HCL 2 MG/2ML IJ SOLN
INTRAMUSCULAR | Status: AC
  Filled 2016-12-22: qty 2

## 2016-12-22 MED ORDER — PROTAMINE SULFATE 10 MG/ML IV SOLN
INTRAVENOUS | Status: DC | PRN
  Administered 2016-12-22: 60 mg via INTRAVENOUS
  Administered 2016-12-22: 10 mg via INTRAVENOUS

## 2016-12-22 MED ORDER — PROMETHAZINE HCL 25 MG/ML IJ SOLN: 6.2500 mg | INTRAMUSCULAR | Status: DC | PRN

## 2016-12-22 MED ORDER — PROPOFOL 10 MG/ML IV BOLUS
INTRAVENOUS | Status: DC | PRN
  Administered 2016-12-22 (×6): 20 mg via INTRAVENOUS

## 2016-12-22 MED ORDER — FENTANYL CITRATE (PF) 100 MCG/2ML IJ SOLN: 25.0000 ug | INTRAMUSCULAR | Status: DC | PRN

## 2016-12-22 MED ORDER — PROPOFOL 500 MG/50ML IV EMUL
INTRAVENOUS | Status: DC | PRN
  Administered 2016-12-22: 09:00:00 via INTRAVENOUS
  Administered 2016-12-22: 120 ug/kg/min via INTRAVENOUS

## 2016-12-22 MED ORDER — PHENYLEPHRINE HCL 10 MG/ML IJ SOLN
INTRAMUSCULAR | Status: DC | PRN
  Administered 2016-12-22: 120 ug via INTRAVENOUS
  Administered 2016-12-22: 40 ug via INTRAVENOUS
  Administered 2016-12-22: 80 ug via INTRAVENOUS
  Administered 2016-12-22: 120 ug via INTRAVENOUS
  Administered 2016-12-22 (×2): 80 ug via INTRAVENOUS
  Administered 2016-12-22: 40 ug via INTRAVENOUS
  Administered 2016-12-22: 80 ug via INTRAVENOUS

## 2016-12-22 MED ORDER — OXYCODONE HCL 5 MG PO TABS: 5.0000 mg | ORAL_TABLET | Freq: Once | ORAL | Status: DC | PRN

## 2016-12-22 MED ORDER — FENTANYL CITRATE (PF) 100 MCG/2ML IJ SOLN
INTRAMUSCULAR | Status: AC
  Filled 2016-12-22: qty 2

## 2016-12-22 MED ORDER — PHENYLEPHRINE HCL 10 MG/ML IJ SOLN
INTRAVENOUS | Status: DC | PRN
  Administered 2016-12-22: 20 ug/min via INTRAVENOUS

## 2016-12-22 MED ORDER — OXYCODONE HCL 5 MG/5ML PO SOLN: 5.0000 mg | Freq: Once | ORAL | Status: DC | PRN

## 2016-12-22 MED ORDER — ALBUMIN HUMAN 5 % IV SOLN
INTRAVENOUS | Status: DC | PRN
  Administered 2016-12-22: 09:00:00 via INTRAVENOUS

## 2016-12-22 MED ORDER — OXYCODONE-ACETAMINOPHEN 5-325 MG PO TABS: 1.0000 | ORAL_TABLET | Freq: Four times a day (QID) | ORAL | 0 refills | Status: DC | PRN

## 2016-12-22 SURGICAL SUPPLY — 30 items
ARMBAND PINK RESTRICT EXTREMIT (MISCELLANEOUS) ×2 IMPLANT
CANISTER SUCT 3000ML PPV (MISCELLANEOUS) ×2 IMPLANT
CANNULA VESSEL 3MM 2 BLNT TIP (CANNULA) ×2 IMPLANT
CLIP TI MEDIUM 6 (CLIP) ×2 IMPLANT
CLIP TI WIDE RED SMALL 6 (CLIP) ×2 IMPLANT
DECANTER SPIKE VIAL GLASS SM (MISCELLANEOUS) IMPLANT
DERMABOND ADVANCED (GAUZE/BANDAGES/DRESSINGS) ×1
DERMABOND ADVANCED .7 DNX12 (GAUZE/BANDAGES/DRESSINGS) ×1 IMPLANT
ELECT REM PT RETURN 9FT ADLT (ELECTROSURGICAL) ×2
ELECTRODE REM PT RTRN 9FT ADLT (ELECTROSURGICAL) ×1 IMPLANT
GAUZE SPONGE 4X4 16PLY XRAY LF (GAUZE/BANDAGES/DRESSINGS) ×2 IMPLANT
GLOVE BIO SURGEON STRL SZ 6.5 (GLOVE) ×2 IMPLANT
GLOVE BIO SURGEON STRL SZ7.5 (GLOVE) ×2 IMPLANT
GLOVE BIOGEL PI IND STRL 6.5 (GLOVE) ×1 IMPLANT
GLOVE BIOGEL PI INDICATOR 6.5 (GLOVE) ×1
GOWN STRL REUS W/ TWL LRG LVL3 (GOWN DISPOSABLE) ×3 IMPLANT
GOWN STRL REUS W/TWL LRG LVL3 (GOWN DISPOSABLE) ×3
GRAFT GORETEX STRT 6X50 (Vascular Products) ×2 IMPLANT
HEMOSTAT SPONGE AVITENE ULTRA (HEMOSTASIS) IMPLANT
KIT BASIN OR (CUSTOM PROCEDURE TRAY) ×2 IMPLANT
KIT ROOM TURNOVER OR (KITS) ×2 IMPLANT
NS IRRIG 1000ML POUR BTL (IV SOLUTION) ×2 IMPLANT
PACK CV ACCESS (CUSTOM PROCEDURE TRAY) ×2 IMPLANT
PAD ARMBOARD 7.5X6 YLW CONV (MISCELLANEOUS) ×4 IMPLANT
SUT PROLENE 6 0 CC (SUTURE) ×8 IMPLANT
SUT VIC AB 3-0 SH 27 (SUTURE) ×2
SUT VIC AB 3-0 SH 27X BRD (SUTURE) ×2 IMPLANT
SUT VICRYL 4-0 PS2 18IN ABS (SUTURE) ×4 IMPLANT
UNDERPAD 30X30 (UNDERPADS AND DIAPERS) ×2 IMPLANT
WATER STERILE IRR 1000ML POUR (IV SOLUTION) ×2 IMPLANT

## 2016-12-22 NOTE — Transfer of Care (Signed)
Immediate Anesthesia Transfer of Care Note  Patient: [name]  Procedure(s) Performed: Procedure(s): INSERTION OF ARTERIOVENOUS (AV) GORE-TEX GRAFT ARM (Right)  Patient Location: PACU  Anesthesia Type:MAC  Level of Consciousness: awake, alert , oriented and patient cooperative  Airway & Oxygen Therapy: Patient Spontanous Breathing  Post-op Assessment: Report given to RN and Post -op Vital signs reviewed and stable  Post vital signs: Reviewed and stable  Last Vitals:  Vitals:   12/22/16 0558  BP: (!) 146/89  Pulse: 78  Resp: 16  Temp: 37.1 C    Last Pain:  Vitals:   12/22/16 0558  TempSrc: Oral      Patients Stated Pain Goal: 2 (80/22/33 6122)  Complications: No apparent anesthesia complications

## 2016-12-22 NOTE — H&P (Signed)
HPI: [name] is a 36 y.o. male, sent for evaluation for placement of a long-term hemodialysis access. He previously had an attempt at a left forearm AV graft and left upper arm AV graft elsewhere. Both of these clotted in a very short time. He has had a right-sided catheter for 3 years. He currently dialyzes Monday Wednesday and Friday at [facility]. He denies any prior complications from his access procedures such as steal or venous hypertension. He states that this didn't work very long. He has never been worked up for hypercoagulable state. Other medical problems include diabetes and hypertension both of which are currently stable.       Past Medical History:  Diagnosis Date  . Diabetes mellitus without complication ([place])    . Dialysis patient ([place])    . Hypertension    . Renal disorder      Dialysis         Past Surgical History:  Procedure Laterality Date  . AV FISTULA PLACEMENT        left arm.  . INSERTION OF DIALYSIS CATHETER        Right subclavian           Family History  Problem Relation Age of Onset  . Diabetes Mellitus II          SOCIAL HISTORY: Social History         Social History  . Marital status: Single      Spouse name: N/A  . Number of children: N/A  . Years of education: N/A       Occupational History  . Not on file.        Social History Main Topics  . Smoking status: Never Smoker  . Smokeless tobacco: Never Used  . Alcohol use No  . Drug use: No  . Sexual activity: No        Other Topics Concern  . Not on file       Social History Narrative  . No narrative on file      No Known Allergies         Current Outpatient Prescriptions  Medication Sig Dispense Refill  . amLODipine (NORVASC) 10 MG tablet Take 10 mg by mouth daily.      . carvedilol (COREG) 25 MG tablet Take 25 mg by mouth 2 (two) times daily with a meal.      . HYDROcodone-acetaminophen (NORCO/VICODIN) 5-325 MG per tablet Take 1 tablet by mouth every 6 (six)  hours as needed for moderate pain. 20 tablet 0  . insulin aspart (NOVOLOG) 100 UNIT/ML injection Inject into the skin 3 (three) times daily before meals.      . insulin glargine (LANTUS) 100 UNIT/ML injection Inject 0.12 mLs (12 Units total) into the skin at bedtime. 10 mL 11  . spironolactone (ALDACTONE) 25 MG tablet Take 25 mg by mouth daily.      . valsartan (DIOVAN) 160 MG tablet Take 160 mg by mouth 2 (two) times daily.      . cloNIDine (CATAPRES) 0.3 MG tablet Take 0.3 mg by mouth 2 (two) times daily.      . insulin lispro (HUMALOG) 100 UNIT/ML injection Inject 8 Units into the skin 3 (three) times daily before meals.      . metoCLOPramide (REGLAN) 5 MG tablet Take 5 mg by mouth 3 (three) times daily before meals.      . minoxidil (LONITEN) 10 MG tablet Take 10 mg by mouth daily.      Marland Kitchen  pantoprazole (PROTONIX) 40 MG tablet Take 1 tablet (40 mg total) by mouth 2 (two) times daily. (Patient not taking: Reported on 11/12/2016) 60 tablet 0    No current facility-administered medications for this visit.       ROS:    General:  No weight loss, Fever, chills   HEENT: No recent headaches, no nasal bleeding, no visual changes, no sore throat   Neurologic: No dizziness, blackouts, seizures. No recent symptoms of stroke or mini- stroke. No recent episodes of slurred speech, or temporary blindness.   Cardiac: No recent episodes of chest pain/pressure, no shortness of breath at rest.  No shortness of breath with exertion.  Denies history of atrial fibrillation or irregular heartbeat   Vascular: No history of rest pain in feet.  No history of claudication.  No history of non-healing ulcer, No history of DVT    Pulmonary: No home oxygen, no productive cough, no hemoptysis,  No asthma or wheezing   Musculoskeletal:  [ ]  Arthritis, [ ]  Low back pain,  [ ]  Joint pain   Hematologic:No history of hypercoagulable state.  No history of easy bleeding.  No history of anemia   Gastrointestinal: No  hematochezia or melena,  No gastroesophageal reflux, no trouble swallowing   Urinary: [ ]  xchronic Kidney disease, [X]  on HD - [X]  MWF or [ ]  TTHS, [ ]  Burning with urination, [ ]  Frequent urination, [ ]  Difficulty urinating;    Skin: No rashes   Psychological: No history of anxiety,  No history of depression     Physical Examination   Vitals:   12/22/16 0558  BP: (!) 146/89  Pulse: 78  Resp: 16  Temp: 98.8 F (37.1 C)  TempSrc: Oral  SpO2: 100%  Weight: 198 lb (89.8 kg)     General:  Alert and oriented, no acute distress HEENT: Normal Neck: No bruit or JVD, right-sided dialysis catheter Pulmonary: Clear to auscultation bilaterally Cardiac: Regular Rate and Rhythm without murmur Abdomen: Soft, non-tender, non-distended Skin: No rash Extremity Pulses:  2+ radial, brachial,pulses bilaterally occluded left forearm and upper arm graft Musculoskeletal: No deformity or edema      Neurologic: Upper and lower extremity motor 5/5 and symmetric   DATA:  Patient had a vein mapping ultrasound today which shows his cephalic and basilic veins are inadequate bilaterally for fistula creation. He also had an arterial duplex exam which are triphasic waveforms with a small radial artery less than 2 mm bilaterally. Brachial artery was 3.5 mm.   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 He has now had a catheter on the right side for 3 years. I believe the best option for him at this point would be to place a right arm AV graft. Risks benefits possible complications and procedure details were explained to the patient today including but not limited to bleeding infection contrast reaction. He understands and agrees to proceed.     PLAN:  See above     Ruta Hinds, MD [facility] [place] Office: [phone] Pager: [phone]

## 2016-12-22 NOTE — Op Note (Addendum)
Procedure: Right Upper Arm AV graft  Preop: ESRD  Postop: ESRD  Anesthesia: Local with sedation  Assistant: Jasmine Awe, RNFA  Findings:6 mm PTFE end to end to axillary vein   Procedure Details: The right upper extremity was prepped and draped in usual sterile fashion.  A longitudinal incision was then made near the antecubital crease the right arm. The incision was carried into the subcutaneous tissues down to level of the brachial artery.  Next the brachial artery was dissected free in the medial portion incision. The artery was  3-4 mm in diameter. The vessel loops were placed proximal and distal to the planned site of arteriotomy. At this point, a longitudinal incision was made in the axilla and carried through the subcutaneous tissues and fascia to expose the axillary vein.  The nerves were protected. The vein was approximately 4-5 mm in diameter.  It was dissected free and controlled with vessel loops.  Next, a subcutaneous tunnel was created connecting the upper arm to the lower arm incision in an arcing configuration over the biceps muscle.  A 6 mm PTFE graft was then brought through this subcutaneous tunnel. The patient was given 7000 units of intravenous heparin. After appropriate circulation time, the vessel loops were used to control the artery. A longitudinal opening was made in the right brachial artery.  The end of the graft was beveled and sewn end to side to the artery using a 6 0 prolene.  At completion of the anastomosis the artery was forward bled, backbled and thoroughly flushed.  The anastomosis was secured, vessel loops were released and there was palpable pulse in the graft.  The graft was clamped just above the arterial anastomosis with a fistula clamp. The graft was then pulled taut to length at the axillary incision.  The axillary vein was controlled with a fine bulldog clamp on a large side branch and a henley clamp in the upper axilla.  The vein was transected and  spatulated.  The distal end of the graft was then beveled and sewn end to end to the vein using a running 6 0 prolene.  Just prior to completion of the anastomosis, everything was forward bled, back bled and thoroughly flushed.  The anastomosis was secured and the fistula clamp removed from the proximal graft.  A thrill was immediately palpable in the graft. The patient was given 70 mg of protamine to assist with hemostasis.  After hemostasis was obtained, the subcutaneous tissues were reapproximated using a running 3-0 Vicryl suture. The skin was then closed with a 4 0 Vicryl subcuticular stitch. Dermabond was applied to the skin incisions.  The patient tolerated the procedure well and there were no complications.  Instrument sponge and needle count was correct at the end of the case.  The patient was taken to the recovery room in stable condition.  Ruta Hinds, MD [facility] Office: [phone] Pager: [phone]

## 2016-12-23 ENCOUNTER — Encounter (HOSPITAL_COMMUNITY): Payer: Self-pay | Admitting: Vascular Surgery

## 2016-12-23 DIAGNOSIS — R739 Hyperglycemia, unspecified: Secondary | ICD-10-CM | POA: Diagnosis not present

## 2016-12-23 DIAGNOSIS — N2581 Secondary hyperparathyroidism of renal origin: Secondary | ICD-10-CM | POA: Diagnosis not present

## 2016-12-23 DIAGNOSIS — D509 Iron deficiency anemia, unspecified: Secondary | ICD-10-CM | POA: Diagnosis not present

## 2016-12-23 DIAGNOSIS — N186 End stage renal disease: Secondary | ICD-10-CM | POA: Diagnosis not present

## 2016-12-23 DIAGNOSIS — E1129 Type 2 diabetes mellitus with other diabetic kidney complication: Secondary | ICD-10-CM | POA: Diagnosis not present

## 2016-12-23 DIAGNOSIS — D631 Anemia in chronic kidney disease: Secondary | ICD-10-CM | POA: Diagnosis not present

## 2016-12-23 NOTE — Anesthesia Postprocedure Evaluation (Signed)
Anesthesia Post Note  Patient: [name]  Procedure(s) Performed: Procedure(s) (LRB): INSERTION OF ARTERIOVENOUS (AV) GORE-TEX GRAFT ARM (Right)  Patient location during evaluation: PACU Anesthesia Type: MAC Level of consciousness: awake and alert and oriented Pain management: pain level controlled Vital Signs Assessment: post-procedure vital signs reviewed and stable Respiratory status: spontaneous breathing, nonlabored ventilation and respiratory function stable Cardiovascular status: stable and blood pressure returned to baseline Postop Assessment: no signs of nausea or vomiting Anesthetic complications: no Comments: Patient with markedly elevated blood glucose post op. Given insulin with minimal results. 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 Discussed with Dr. Oneida Alar. Will allow patient to be discharged and manage his own blood sugars.        Last Vitals:  Vitals:   12/22/16 1215 12/22/16 1236  BP:    Pulse: 79   Resp: (!) 24   Temp:  36.5 C    Last Pain:  Vitals:   12/22/16 1106  TempSrc:   PainSc: Asleep   Pain Goal: Patients Stated Pain Goal: 2 (12/22/16 7616)               Julius Boniface A.

## 2016-12-25 DIAGNOSIS — N186 End stage renal disease: Secondary | ICD-10-CM | POA: Diagnosis not present

## 2016-12-25 DIAGNOSIS — D509 Iron deficiency anemia, unspecified: Secondary | ICD-10-CM | POA: Diagnosis not present

## 2016-12-25 DIAGNOSIS — E1129 Type 2 diabetes mellitus with other diabetic kidney complication: Secondary | ICD-10-CM | POA: Diagnosis not present

## 2016-12-25 DIAGNOSIS — N2581 Secondary hyperparathyroidism of renal origin: Secondary | ICD-10-CM | POA: Diagnosis not present

## 2016-12-25 DIAGNOSIS — D631 Anemia in chronic kidney disease: Secondary | ICD-10-CM | POA: Diagnosis not present

## 2016-12-25 DIAGNOSIS — R739 Hyperglycemia, unspecified: Secondary | ICD-10-CM | POA: Diagnosis not present

## 2016-12-26 DIAGNOSIS — N186 End stage renal disease: Secondary | ICD-10-CM | POA: Diagnosis not present

## 2016-12-26 DIAGNOSIS — Z992 Dependence on renal dialysis: Secondary | ICD-10-CM | POA: Diagnosis not present

## 2016-12-26 DIAGNOSIS — I129 Hypertensive chronic kidney disease with stage 1 through stage 4 chronic kidney disease, or unspecified chronic kidney disease: Secondary | ICD-10-CM | POA: Diagnosis not present

## 2016-12-28 DIAGNOSIS — R739 Hyperglycemia, unspecified: Secondary | ICD-10-CM | POA: Diagnosis not present

## 2016-12-28 DIAGNOSIS — E1129 Type 2 diabetes mellitus with other diabetic kidney complication: Secondary | ICD-10-CM | POA: Diagnosis not present

## 2016-12-28 DIAGNOSIS — D631 Anemia in chronic kidney disease: Secondary | ICD-10-CM | POA: Diagnosis not present

## 2016-12-28 DIAGNOSIS — N186 End stage renal disease: Secondary | ICD-10-CM | POA: Diagnosis not present

## 2016-12-28 DIAGNOSIS — N2581 Secondary hyperparathyroidism of renal origin: Secondary | ICD-10-CM | POA: Diagnosis not present

## 2016-12-28 DIAGNOSIS — D509 Iron deficiency anemia, unspecified: Secondary | ICD-10-CM | POA: Diagnosis not present

## 2016-12-30 DIAGNOSIS — N2581 Secondary hyperparathyroidism of renal origin: Secondary | ICD-10-CM | POA: Diagnosis not present

## 2016-12-30 DIAGNOSIS — R739 Hyperglycemia, unspecified: Secondary | ICD-10-CM | POA: Diagnosis not present

## 2016-12-30 DIAGNOSIS — D509 Iron deficiency anemia, unspecified: Secondary | ICD-10-CM | POA: Diagnosis not present

## 2016-12-30 DIAGNOSIS — D631 Anemia in chronic kidney disease: Secondary | ICD-10-CM | POA: Diagnosis not present

## 2016-12-30 DIAGNOSIS — E1129 Type 2 diabetes mellitus with other diabetic kidney complication: Secondary | ICD-10-CM | POA: Diagnosis not present

## 2016-12-30 DIAGNOSIS — N186 End stage renal disease: Secondary | ICD-10-CM | POA: Diagnosis not present

## 2017-01-01 DIAGNOSIS — R739 Hyperglycemia, unspecified: Secondary | ICD-10-CM | POA: Diagnosis not present

## 2017-01-01 DIAGNOSIS — D509 Iron deficiency anemia, unspecified: Secondary | ICD-10-CM | POA: Diagnosis not present

## 2017-01-01 DIAGNOSIS — N186 End stage renal disease: Secondary | ICD-10-CM | POA: Diagnosis not present

## 2017-01-01 DIAGNOSIS — E1129 Type 2 diabetes mellitus with other diabetic kidney complication: Secondary | ICD-10-CM | POA: Diagnosis not present

## 2017-01-01 DIAGNOSIS — N2581 Secondary hyperparathyroidism of renal origin: Secondary | ICD-10-CM | POA: Diagnosis not present

## 2017-01-01 DIAGNOSIS — D631 Anemia in chronic kidney disease: Secondary | ICD-10-CM | POA: Diagnosis not present

## 2017-01-04 DIAGNOSIS — N186 End stage renal disease: Secondary | ICD-10-CM | POA: Diagnosis not present

## 2017-01-04 DIAGNOSIS — D631 Anemia in chronic kidney disease: Secondary | ICD-10-CM | POA: Diagnosis not present

## 2017-01-04 DIAGNOSIS — N2581 Secondary hyperparathyroidism of renal origin: Secondary | ICD-10-CM | POA: Diagnosis not present

## 2017-01-04 DIAGNOSIS — E1129 Type 2 diabetes mellitus with other diabetic kidney complication: Secondary | ICD-10-CM | POA: Diagnosis not present

## 2017-01-04 DIAGNOSIS — D509 Iron deficiency anemia, unspecified: Secondary | ICD-10-CM | POA: Diagnosis not present

## 2017-01-04 DIAGNOSIS — R739 Hyperglycemia, unspecified: Secondary | ICD-10-CM | POA: Diagnosis not present

## 2017-01-06 DIAGNOSIS — E1129 Type 2 diabetes mellitus with other diabetic kidney complication: Secondary | ICD-10-CM | POA: Diagnosis not present

## 2017-01-06 DIAGNOSIS — N186 End stage renal disease: Secondary | ICD-10-CM | POA: Diagnosis not present

## 2017-01-06 DIAGNOSIS — N2581 Secondary hyperparathyroidism of renal origin: Secondary | ICD-10-CM | POA: Diagnosis not present

## 2017-01-06 DIAGNOSIS — D509 Iron deficiency anemia, unspecified: Secondary | ICD-10-CM | POA: Diagnosis not present

## 2017-01-06 DIAGNOSIS — D631 Anemia in chronic kidney disease: Secondary | ICD-10-CM | POA: Diagnosis not present

## 2017-01-06 DIAGNOSIS — R739 Hyperglycemia, unspecified: Secondary | ICD-10-CM | POA: Diagnosis not present

## 2017-01-08 DIAGNOSIS — R739 Hyperglycemia, unspecified: Secondary | ICD-10-CM | POA: Diagnosis not present

## 2017-01-08 DIAGNOSIS — D509 Iron deficiency anemia, unspecified: Secondary | ICD-10-CM | POA: Diagnosis not present

## 2017-01-08 DIAGNOSIS — N2581 Secondary hyperparathyroidism of renal origin: Secondary | ICD-10-CM | POA: Diagnosis not present

## 2017-01-08 DIAGNOSIS — E1129 Type 2 diabetes mellitus with other diabetic kidney complication: Secondary | ICD-10-CM | POA: Diagnosis not present

## 2017-01-08 DIAGNOSIS — D631 Anemia in chronic kidney disease: Secondary | ICD-10-CM | POA: Diagnosis not present

## 2017-01-08 DIAGNOSIS — N186 End stage renal disease: Secondary | ICD-10-CM | POA: Diagnosis not present

## 2017-01-11 DIAGNOSIS — D631 Anemia in chronic kidney disease: Secondary | ICD-10-CM | POA: Diagnosis not present

## 2017-01-11 DIAGNOSIS — D509 Iron deficiency anemia, unspecified: Secondary | ICD-10-CM | POA: Diagnosis not present

## 2017-01-11 DIAGNOSIS — R739 Hyperglycemia, unspecified: Secondary | ICD-10-CM | POA: Diagnosis not present

## 2017-01-11 DIAGNOSIS — N2581 Secondary hyperparathyroidism of renal origin: Secondary | ICD-10-CM | POA: Diagnosis not present

## 2017-01-11 DIAGNOSIS — E1129 Type 2 diabetes mellitus with other diabetic kidney complication: Secondary | ICD-10-CM | POA: Diagnosis not present

## 2017-01-11 DIAGNOSIS — N186 End stage renal disease: Secondary | ICD-10-CM | POA: Diagnosis not present

## 2017-01-13 DIAGNOSIS — D631 Anemia in chronic kidney disease: Secondary | ICD-10-CM | POA: Diagnosis not present

## 2017-01-13 DIAGNOSIS — R739 Hyperglycemia, unspecified: Secondary | ICD-10-CM | POA: Diagnosis not present

## 2017-01-13 DIAGNOSIS — N2581 Secondary hyperparathyroidism of renal origin: Secondary | ICD-10-CM | POA: Diagnosis not present

## 2017-01-13 DIAGNOSIS — D509 Iron deficiency anemia, unspecified: Secondary | ICD-10-CM | POA: Diagnosis not present

## 2017-01-13 DIAGNOSIS — N186 End stage renal disease: Secondary | ICD-10-CM | POA: Diagnosis not present

## 2017-01-13 DIAGNOSIS — E1129 Type 2 diabetes mellitus with other diabetic kidney complication: Secondary | ICD-10-CM | POA: Diagnosis not present

## 2017-01-15 DIAGNOSIS — N186 End stage renal disease: Secondary | ICD-10-CM | POA: Diagnosis not present

## 2017-01-15 DIAGNOSIS — N2581 Secondary hyperparathyroidism of renal origin: Secondary | ICD-10-CM | POA: Diagnosis not present

## 2017-01-15 DIAGNOSIS — E1129 Type 2 diabetes mellitus with other diabetic kidney complication: Secondary | ICD-10-CM | POA: Diagnosis not present

## 2017-01-15 DIAGNOSIS — D509 Iron deficiency anemia, unspecified: Secondary | ICD-10-CM | POA: Diagnosis not present

## 2017-01-15 DIAGNOSIS — D631 Anemia in chronic kidney disease: Secondary | ICD-10-CM | POA: Diagnosis not present

## 2017-01-15 DIAGNOSIS — R739 Hyperglycemia, unspecified: Secondary | ICD-10-CM | POA: Diagnosis not present

## 2017-01-18 DIAGNOSIS — N2581 Secondary hyperparathyroidism of renal origin: Secondary | ICD-10-CM | POA: Diagnosis not present

## 2017-01-18 DIAGNOSIS — R739 Hyperglycemia, unspecified: Secondary | ICD-10-CM | POA: Diagnosis not present

## 2017-01-18 DIAGNOSIS — D509 Iron deficiency anemia, unspecified: Secondary | ICD-10-CM | POA: Diagnosis not present

## 2017-01-18 DIAGNOSIS — D631 Anemia in chronic kidney disease: Secondary | ICD-10-CM | POA: Diagnosis not present

## 2017-01-18 DIAGNOSIS — E1129 Type 2 diabetes mellitus with other diabetic kidney complication: Secondary | ICD-10-CM | POA: Diagnosis not present

## 2017-01-18 DIAGNOSIS — N186 End stage renal disease: Secondary | ICD-10-CM | POA: Diagnosis not present

## 2017-01-20 DIAGNOSIS — R739 Hyperglycemia, unspecified: Secondary | ICD-10-CM | POA: Diagnosis not present

## 2017-01-20 DIAGNOSIS — N2581 Secondary hyperparathyroidism of renal origin: Secondary | ICD-10-CM | POA: Diagnosis not present

## 2017-01-20 DIAGNOSIS — D509 Iron deficiency anemia, unspecified: Secondary | ICD-10-CM | POA: Diagnosis not present

## 2017-01-20 DIAGNOSIS — E1129 Type 2 diabetes mellitus with other diabetic kidney complication: Secondary | ICD-10-CM | POA: Diagnosis not present

## 2017-01-20 DIAGNOSIS — D631 Anemia in chronic kidney disease: Secondary | ICD-10-CM | POA: Diagnosis not present

## 2017-01-20 DIAGNOSIS — N186 End stage renal disease: Secondary | ICD-10-CM | POA: Diagnosis not present

## 2017-01-22 DIAGNOSIS — E1129 Type 2 diabetes mellitus with other diabetic kidney complication: Secondary | ICD-10-CM | POA: Diagnosis not present

## 2017-01-22 DIAGNOSIS — D509 Iron deficiency anemia, unspecified: Secondary | ICD-10-CM | POA: Diagnosis not present

## 2017-01-22 DIAGNOSIS — R739 Hyperglycemia, unspecified: Secondary | ICD-10-CM | POA: Diagnosis not present

## 2017-01-22 DIAGNOSIS — N2581 Secondary hyperparathyroidism of renal origin: Secondary | ICD-10-CM | POA: Diagnosis not present

## 2017-01-22 DIAGNOSIS — D631 Anemia in chronic kidney disease: Secondary | ICD-10-CM | POA: Diagnosis not present

## 2017-01-22 DIAGNOSIS — N186 End stage renal disease: Secondary | ICD-10-CM | POA: Diagnosis not present

## 2017-01-25 DIAGNOSIS — E1129 Type 2 diabetes mellitus with other diabetic kidney complication: Secondary | ICD-10-CM | POA: Diagnosis not present

## 2017-01-25 DIAGNOSIS — D509 Iron deficiency anemia, unspecified: Secondary | ICD-10-CM | POA: Diagnosis not present

## 2017-01-25 DIAGNOSIS — D631 Anemia in chronic kidney disease: Secondary | ICD-10-CM | POA: Diagnosis not present

## 2017-01-25 DIAGNOSIS — N186 End stage renal disease: Secondary | ICD-10-CM | POA: Diagnosis not present

## 2017-01-25 DIAGNOSIS — R739 Hyperglycemia, unspecified: Secondary | ICD-10-CM | POA: Diagnosis not present

## 2017-01-25 DIAGNOSIS — Z992 Dependence on renal dialysis: Secondary | ICD-10-CM | POA: Diagnosis not present

## 2017-01-25 DIAGNOSIS — N2581 Secondary hyperparathyroidism of renal origin: Secondary | ICD-10-CM | POA: Diagnosis not present

## 2017-01-25 DIAGNOSIS — I129 Hypertensive chronic kidney disease with stage 1 through stage 4 chronic kidney disease, or unspecified chronic kidney disease: Secondary | ICD-10-CM | POA: Diagnosis not present

## 2017-01-27 DIAGNOSIS — N186 End stage renal disease: Secondary | ICD-10-CM | POA: Diagnosis not present

## 2017-01-27 DIAGNOSIS — D631 Anemia in chronic kidney disease: Secondary | ICD-10-CM | POA: Diagnosis not present

## 2017-01-27 DIAGNOSIS — N2581 Secondary hyperparathyroidism of renal origin: Secondary | ICD-10-CM | POA: Diagnosis not present

## 2017-01-27 DIAGNOSIS — E1129 Type 2 diabetes mellitus with other diabetic kidney complication: Secondary | ICD-10-CM | POA: Diagnosis not present

## 2017-01-27 DIAGNOSIS — D509 Iron deficiency anemia, unspecified: Secondary | ICD-10-CM | POA: Diagnosis not present

## 2017-01-27 DIAGNOSIS — R739 Hyperglycemia, unspecified: Secondary | ICD-10-CM | POA: Diagnosis not present

## 2017-01-29 DIAGNOSIS — E1129 Type 2 diabetes mellitus with other diabetic kidney complication: Secondary | ICD-10-CM | POA: Diagnosis not present

## 2017-01-29 DIAGNOSIS — R739 Hyperglycemia, unspecified: Secondary | ICD-10-CM | POA: Diagnosis not present

## 2017-01-29 DIAGNOSIS — D631 Anemia in chronic kidney disease: Secondary | ICD-10-CM | POA: Diagnosis not present

## 2017-01-29 DIAGNOSIS — N2581 Secondary hyperparathyroidism of renal origin: Secondary | ICD-10-CM | POA: Diagnosis not present

## 2017-01-29 DIAGNOSIS — N186 End stage renal disease: Secondary | ICD-10-CM | POA: Diagnosis not present

## 2017-01-29 DIAGNOSIS — D509 Iron deficiency anemia, unspecified: Secondary | ICD-10-CM | POA: Diagnosis not present

## 2017-02-01 DIAGNOSIS — R739 Hyperglycemia, unspecified: Secondary | ICD-10-CM | POA: Diagnosis not present

## 2017-02-01 DIAGNOSIS — D631 Anemia in chronic kidney disease: Secondary | ICD-10-CM | POA: Diagnosis not present

## 2017-02-01 DIAGNOSIS — N186 End stage renal disease: Secondary | ICD-10-CM | POA: Diagnosis not present

## 2017-02-01 DIAGNOSIS — E1129 Type 2 diabetes mellitus with other diabetic kidney complication: Secondary | ICD-10-CM | POA: Diagnosis not present

## 2017-02-01 DIAGNOSIS — N2581 Secondary hyperparathyroidism of renal origin: Secondary | ICD-10-CM | POA: Diagnosis not present

## 2017-02-01 DIAGNOSIS — D509 Iron deficiency anemia, unspecified: Secondary | ICD-10-CM | POA: Diagnosis not present

## 2017-02-03 DIAGNOSIS — E1129 Type 2 diabetes mellitus with other diabetic kidney complication: Secondary | ICD-10-CM | POA: Diagnosis not present

## 2017-02-03 DIAGNOSIS — N2581 Secondary hyperparathyroidism of renal origin: Secondary | ICD-10-CM | POA: Diagnosis not present

## 2017-02-03 DIAGNOSIS — N186 End stage renal disease: Secondary | ICD-10-CM | POA: Diagnosis not present

## 2017-02-03 DIAGNOSIS — D631 Anemia in chronic kidney disease: Secondary | ICD-10-CM | POA: Diagnosis not present

## 2017-02-03 DIAGNOSIS — R739 Hyperglycemia, unspecified: Secondary | ICD-10-CM | POA: Diagnosis not present

## 2017-02-03 DIAGNOSIS — D509 Iron deficiency anemia, unspecified: Secondary | ICD-10-CM | POA: Diagnosis not present

## 2017-02-05 DIAGNOSIS — E1129 Type 2 diabetes mellitus with other diabetic kidney complication: Secondary | ICD-10-CM | POA: Diagnosis not present

## 2017-02-05 DIAGNOSIS — D509 Iron deficiency anemia, unspecified: Secondary | ICD-10-CM | POA: Diagnosis not present

## 2017-02-05 DIAGNOSIS — N186 End stage renal disease: Secondary | ICD-10-CM | POA: Diagnosis not present

## 2017-02-05 DIAGNOSIS — D631 Anemia in chronic kidney disease: Secondary | ICD-10-CM | POA: Diagnosis not present

## 2017-02-05 DIAGNOSIS — N2581 Secondary hyperparathyroidism of renal origin: Secondary | ICD-10-CM | POA: Diagnosis not present

## 2017-02-05 DIAGNOSIS — R739 Hyperglycemia, unspecified: Secondary | ICD-10-CM | POA: Diagnosis not present

## 2017-02-08 DIAGNOSIS — D631 Anemia in chronic kidney disease: Secondary | ICD-10-CM | POA: Diagnosis not present

## 2017-02-08 DIAGNOSIS — D509 Iron deficiency anemia, unspecified: Secondary | ICD-10-CM | POA: Diagnosis not present

## 2017-02-08 DIAGNOSIS — N186 End stage renal disease: Secondary | ICD-10-CM | POA: Diagnosis not present

## 2017-02-08 DIAGNOSIS — E1129 Type 2 diabetes mellitus with other diabetic kidney complication: Secondary | ICD-10-CM | POA: Diagnosis not present

## 2017-02-08 DIAGNOSIS — R739 Hyperglycemia, unspecified: Secondary | ICD-10-CM | POA: Diagnosis not present

## 2017-02-08 DIAGNOSIS — N2581 Secondary hyperparathyroidism of renal origin: Secondary | ICD-10-CM | POA: Diagnosis not present

## 2017-02-10 DIAGNOSIS — R739 Hyperglycemia, unspecified: Secondary | ICD-10-CM | POA: Diagnosis not present

## 2017-02-10 DIAGNOSIS — D631 Anemia in chronic kidney disease: Secondary | ICD-10-CM | POA: Diagnosis not present

## 2017-02-10 DIAGNOSIS — N2581 Secondary hyperparathyroidism of renal origin: Secondary | ICD-10-CM | POA: Diagnosis not present

## 2017-02-10 DIAGNOSIS — N186 End stage renal disease: Secondary | ICD-10-CM | POA: Diagnosis not present

## 2017-02-10 DIAGNOSIS — D509 Iron deficiency anemia, unspecified: Secondary | ICD-10-CM | POA: Diagnosis not present

## 2017-02-10 DIAGNOSIS — E1129 Type 2 diabetes mellitus with other diabetic kidney complication: Secondary | ICD-10-CM | POA: Diagnosis not present

## 2017-02-12 DIAGNOSIS — E1129 Type 2 diabetes mellitus with other diabetic kidney complication: Secondary | ICD-10-CM | POA: Diagnosis not present

## 2017-02-12 DIAGNOSIS — R739 Hyperglycemia, unspecified: Secondary | ICD-10-CM | POA: Diagnosis not present

## 2017-02-12 DIAGNOSIS — D509 Iron deficiency anemia, unspecified: Secondary | ICD-10-CM | POA: Diagnosis not present

## 2017-02-12 DIAGNOSIS — N186 End stage renal disease: Secondary | ICD-10-CM | POA: Diagnosis not present

## 2017-02-12 DIAGNOSIS — D631 Anemia in chronic kidney disease: Secondary | ICD-10-CM | POA: Diagnosis not present

## 2017-02-12 DIAGNOSIS — N2581 Secondary hyperparathyroidism of renal origin: Secondary | ICD-10-CM | POA: Diagnosis not present

## 2017-02-15 DIAGNOSIS — R739 Hyperglycemia, unspecified: Secondary | ICD-10-CM | POA: Diagnosis not present

## 2017-02-15 DIAGNOSIS — E1129 Type 2 diabetes mellitus with other diabetic kidney complication: Secondary | ICD-10-CM | POA: Diagnosis not present

## 2017-02-15 DIAGNOSIS — D631 Anemia in chronic kidney disease: Secondary | ICD-10-CM | POA: Diagnosis not present

## 2017-02-15 DIAGNOSIS — N2581 Secondary hyperparathyroidism of renal origin: Secondary | ICD-10-CM | POA: Diagnosis not present

## 2017-02-15 DIAGNOSIS — N186 End stage renal disease: Secondary | ICD-10-CM | POA: Diagnosis not present

## 2017-02-15 DIAGNOSIS — D509 Iron deficiency anemia, unspecified: Secondary | ICD-10-CM | POA: Diagnosis not present

## 2017-02-17 DIAGNOSIS — N2581 Secondary hyperparathyroidism of renal origin: Secondary | ICD-10-CM | POA: Diagnosis not present

## 2017-02-17 DIAGNOSIS — E1129 Type 2 diabetes mellitus with other diabetic kidney complication: Secondary | ICD-10-CM | POA: Diagnosis not present

## 2017-02-17 DIAGNOSIS — R739 Hyperglycemia, unspecified: Secondary | ICD-10-CM | POA: Diagnosis not present

## 2017-02-17 DIAGNOSIS — D509 Iron deficiency anemia, unspecified: Secondary | ICD-10-CM | POA: Diagnosis not present

## 2017-02-17 DIAGNOSIS — N186 End stage renal disease: Secondary | ICD-10-CM | POA: Diagnosis not present

## 2017-02-17 DIAGNOSIS — D631 Anemia in chronic kidney disease: Secondary | ICD-10-CM | POA: Diagnosis not present

## 2017-02-19 DIAGNOSIS — D509 Iron deficiency anemia, unspecified: Secondary | ICD-10-CM | POA: Diagnosis not present

## 2017-02-19 DIAGNOSIS — R739 Hyperglycemia, unspecified: Secondary | ICD-10-CM | POA: Diagnosis not present

## 2017-02-19 DIAGNOSIS — D631 Anemia in chronic kidney disease: Secondary | ICD-10-CM | POA: Diagnosis not present

## 2017-02-19 DIAGNOSIS — N2581 Secondary hyperparathyroidism of renal origin: Secondary | ICD-10-CM | POA: Diagnosis not present

## 2017-02-19 DIAGNOSIS — N186 End stage renal disease: Secondary | ICD-10-CM | POA: Diagnosis not present

## 2017-02-19 DIAGNOSIS — E1129 Type 2 diabetes mellitus with other diabetic kidney complication: Secondary | ICD-10-CM | POA: Diagnosis not present

## 2017-02-22 DIAGNOSIS — D509 Iron deficiency anemia, unspecified: Secondary | ICD-10-CM | POA: Diagnosis not present

## 2017-02-22 DIAGNOSIS — D631 Anemia in chronic kidney disease: Secondary | ICD-10-CM | POA: Diagnosis not present

## 2017-02-22 DIAGNOSIS — E1129 Type 2 diabetes mellitus with other diabetic kidney complication: Secondary | ICD-10-CM | POA: Diagnosis not present

## 2017-02-22 DIAGNOSIS — N2581 Secondary hyperparathyroidism of renal origin: Secondary | ICD-10-CM | POA: Diagnosis not present

## 2017-02-22 DIAGNOSIS — N186 End stage renal disease: Secondary | ICD-10-CM | POA: Diagnosis not present

## 2017-02-22 DIAGNOSIS — R739 Hyperglycemia, unspecified: Secondary | ICD-10-CM | POA: Diagnosis not present

## 2017-02-24 DIAGNOSIS — N2581 Secondary hyperparathyroidism of renal origin: Secondary | ICD-10-CM | POA: Diagnosis not present

## 2017-02-24 DIAGNOSIS — E1129 Type 2 diabetes mellitus with other diabetic kidney complication: Secondary | ICD-10-CM | POA: Diagnosis not present

## 2017-02-24 DIAGNOSIS — D509 Iron deficiency anemia, unspecified: Secondary | ICD-10-CM | POA: Diagnosis not present

## 2017-02-24 DIAGNOSIS — N186 End stage renal disease: Secondary | ICD-10-CM | POA: Diagnosis not present

## 2017-02-24 DIAGNOSIS — R739 Hyperglycemia, unspecified: Secondary | ICD-10-CM | POA: Diagnosis not present

## 2017-02-24 DIAGNOSIS — D631 Anemia in chronic kidney disease: Secondary | ICD-10-CM | POA: Diagnosis not present

## 2017-02-25 DIAGNOSIS — N186 End stage renal disease: Secondary | ICD-10-CM | POA: Diagnosis not present

## 2017-02-25 DIAGNOSIS — Z992 Dependence on renal dialysis: Secondary | ICD-10-CM | POA: Diagnosis not present

## 2017-02-25 DIAGNOSIS — I129 Hypertensive chronic kidney disease with stage 1 through stage 4 chronic kidney disease, or unspecified chronic kidney disease: Secondary | ICD-10-CM | POA: Diagnosis not present

## 2017-02-26 DIAGNOSIS — D509 Iron deficiency anemia, unspecified: Secondary | ICD-10-CM | POA: Diagnosis not present

## 2017-02-26 DIAGNOSIS — N2581 Secondary hyperparathyroidism of renal origin: Secondary | ICD-10-CM | POA: Diagnosis not present

## 2017-02-26 DIAGNOSIS — E1129 Type 2 diabetes mellitus with other diabetic kidney complication: Secondary | ICD-10-CM | POA: Diagnosis not present

## 2017-02-26 DIAGNOSIS — R739 Hyperglycemia, unspecified: Secondary | ICD-10-CM | POA: Diagnosis not present

## 2017-02-26 DIAGNOSIS — N186 End stage renal disease: Secondary | ICD-10-CM | POA: Diagnosis not present

## 2017-03-01 DIAGNOSIS — N186 End stage renal disease: Secondary | ICD-10-CM | POA: Diagnosis not present

## 2017-03-01 DIAGNOSIS — E1129 Type 2 diabetes mellitus with other diabetic kidney complication: Secondary | ICD-10-CM | POA: Diagnosis not present

## 2017-03-01 DIAGNOSIS — R739 Hyperglycemia, unspecified: Secondary | ICD-10-CM | POA: Diagnosis not present

## 2017-03-01 DIAGNOSIS — N2581 Secondary hyperparathyroidism of renal origin: Secondary | ICD-10-CM | POA: Diagnosis not present

## 2017-03-01 DIAGNOSIS — D509 Iron deficiency anemia, unspecified: Secondary | ICD-10-CM | POA: Diagnosis not present

## 2017-03-03 DIAGNOSIS — N2581 Secondary hyperparathyroidism of renal origin: Secondary | ICD-10-CM | POA: Diagnosis not present

## 2017-03-03 DIAGNOSIS — N186 End stage renal disease: Secondary | ICD-10-CM | POA: Diagnosis not present

## 2017-03-03 DIAGNOSIS — D509 Iron deficiency anemia, unspecified: Secondary | ICD-10-CM | POA: Diagnosis not present

## 2017-03-03 DIAGNOSIS — E1129 Type 2 diabetes mellitus with other diabetic kidney complication: Secondary | ICD-10-CM | POA: Diagnosis not present

## 2017-03-03 DIAGNOSIS — R739 Hyperglycemia, unspecified: Secondary | ICD-10-CM | POA: Diagnosis not present

## 2017-03-04 DIAGNOSIS — Z452 Encounter for adjustment and management of vascular access device: Secondary | ICD-10-CM | POA: Diagnosis not present

## 2017-03-05 DIAGNOSIS — N186 End stage renal disease: Secondary | ICD-10-CM | POA: Diagnosis not present

## 2017-03-05 DIAGNOSIS — D509 Iron deficiency anemia, unspecified: Secondary | ICD-10-CM | POA: Diagnosis not present

## 2017-03-05 DIAGNOSIS — E1129 Type 2 diabetes mellitus with other diabetic kidney complication: Secondary | ICD-10-CM | POA: Diagnosis not present

## 2017-03-05 DIAGNOSIS — R739 Hyperglycemia, unspecified: Secondary | ICD-10-CM | POA: Diagnosis not present

## 2017-03-05 DIAGNOSIS — N2581 Secondary hyperparathyroidism of renal origin: Secondary | ICD-10-CM | POA: Diagnosis not present

## 2017-03-08 DIAGNOSIS — D509 Iron deficiency anemia, unspecified: Secondary | ICD-10-CM | POA: Diagnosis not present

## 2017-03-08 DIAGNOSIS — N186 End stage renal disease: Secondary | ICD-10-CM | POA: Diagnosis not present

## 2017-03-08 DIAGNOSIS — R739 Hyperglycemia, unspecified: Secondary | ICD-10-CM | POA: Diagnosis not present

## 2017-03-08 DIAGNOSIS — E1129 Type 2 diabetes mellitus with other diabetic kidney complication: Secondary | ICD-10-CM | POA: Diagnosis not present

## 2017-03-08 DIAGNOSIS — N2581 Secondary hyperparathyroidism of renal origin: Secondary | ICD-10-CM | POA: Diagnosis not present

## 2017-03-10 DIAGNOSIS — D509 Iron deficiency anemia, unspecified: Secondary | ICD-10-CM | POA: Diagnosis not present

## 2017-03-10 DIAGNOSIS — E1129 Type 2 diabetes mellitus with other diabetic kidney complication: Secondary | ICD-10-CM | POA: Diagnosis not present

## 2017-03-10 DIAGNOSIS — R739 Hyperglycemia, unspecified: Secondary | ICD-10-CM | POA: Diagnosis not present

## 2017-03-10 DIAGNOSIS — N186 End stage renal disease: Secondary | ICD-10-CM | POA: Diagnosis not present

## 2017-03-10 DIAGNOSIS — N2581 Secondary hyperparathyroidism of renal origin: Secondary | ICD-10-CM | POA: Diagnosis not present

## 2017-03-12 DIAGNOSIS — D509 Iron deficiency anemia, unspecified: Secondary | ICD-10-CM | POA: Diagnosis not present

## 2017-03-12 DIAGNOSIS — N186 End stage renal disease: Secondary | ICD-10-CM | POA: Diagnosis not present

## 2017-03-12 DIAGNOSIS — N2581 Secondary hyperparathyroidism of renal origin: Secondary | ICD-10-CM | POA: Diagnosis not present

## 2017-03-12 DIAGNOSIS — R739 Hyperglycemia, unspecified: Secondary | ICD-10-CM | POA: Diagnosis not present

## 2017-03-12 DIAGNOSIS — E1129 Type 2 diabetes mellitus with other diabetic kidney complication: Secondary | ICD-10-CM | POA: Diagnosis not present

## 2017-03-15 DIAGNOSIS — E1129 Type 2 diabetes mellitus with other diabetic kidney complication: Secondary | ICD-10-CM | POA: Diagnosis not present

## 2017-03-15 DIAGNOSIS — R739 Hyperglycemia, unspecified: Secondary | ICD-10-CM | POA: Diagnosis not present

## 2017-03-15 DIAGNOSIS — D509 Iron deficiency anemia, unspecified: Secondary | ICD-10-CM | POA: Diagnosis not present

## 2017-03-15 DIAGNOSIS — N2581 Secondary hyperparathyroidism of renal origin: Secondary | ICD-10-CM | POA: Diagnosis not present

## 2017-03-15 DIAGNOSIS — N186 End stage renal disease: Secondary | ICD-10-CM | POA: Diagnosis not present

## 2017-03-17 DIAGNOSIS — D509 Iron deficiency anemia, unspecified: Secondary | ICD-10-CM | POA: Diagnosis not present

## 2017-03-17 DIAGNOSIS — N2581 Secondary hyperparathyroidism of renal origin: Secondary | ICD-10-CM | POA: Diagnosis not present

## 2017-03-17 DIAGNOSIS — R739 Hyperglycemia, unspecified: Secondary | ICD-10-CM | POA: Diagnosis not present

## 2017-03-17 DIAGNOSIS — N186 End stage renal disease: Secondary | ICD-10-CM | POA: Diagnosis not present

## 2017-03-17 DIAGNOSIS — E1129 Type 2 diabetes mellitus with other diabetic kidney complication: Secondary | ICD-10-CM | POA: Diagnosis not present

## 2017-03-19 DIAGNOSIS — N186 End stage renal disease: Secondary | ICD-10-CM | POA: Diagnosis not present

## 2017-03-19 DIAGNOSIS — R739 Hyperglycemia, unspecified: Secondary | ICD-10-CM | POA: Diagnosis not present

## 2017-03-19 DIAGNOSIS — N2581 Secondary hyperparathyroidism of renal origin: Secondary | ICD-10-CM | POA: Diagnosis not present

## 2017-03-19 DIAGNOSIS — D509 Iron deficiency anemia, unspecified: Secondary | ICD-10-CM | POA: Diagnosis not present

## 2017-03-19 DIAGNOSIS — E1129 Type 2 diabetes mellitus with other diabetic kidney complication: Secondary | ICD-10-CM | POA: Diagnosis not present

## 2017-03-22 DIAGNOSIS — E1129 Type 2 diabetes mellitus with other diabetic kidney complication: Secondary | ICD-10-CM | POA: Diagnosis not present

## 2017-03-22 DIAGNOSIS — D509 Iron deficiency anemia, unspecified: Secondary | ICD-10-CM | POA: Diagnosis not present

## 2017-03-22 DIAGNOSIS — N186 End stage renal disease: Secondary | ICD-10-CM | POA: Diagnosis not present

## 2017-03-22 DIAGNOSIS — N2581 Secondary hyperparathyroidism of renal origin: Secondary | ICD-10-CM | POA: Diagnosis not present

## 2017-03-22 DIAGNOSIS — R739 Hyperglycemia, unspecified: Secondary | ICD-10-CM | POA: Diagnosis not present

## 2017-03-24 DIAGNOSIS — E1129 Type 2 diabetes mellitus with other diabetic kidney complication: Secondary | ICD-10-CM | POA: Diagnosis not present

## 2017-03-24 DIAGNOSIS — N2581 Secondary hyperparathyroidism of renal origin: Secondary | ICD-10-CM | POA: Diagnosis not present

## 2017-03-24 DIAGNOSIS — R739 Hyperglycemia, unspecified: Secondary | ICD-10-CM | POA: Diagnosis not present

## 2017-03-24 DIAGNOSIS — D509 Iron deficiency anemia, unspecified: Secondary | ICD-10-CM | POA: Diagnosis not present

## 2017-03-24 DIAGNOSIS — N186 End stage renal disease: Secondary | ICD-10-CM | POA: Diagnosis not present

## 2017-03-26 DIAGNOSIS — N2581 Secondary hyperparathyroidism of renal origin: Secondary | ICD-10-CM | POA: Diagnosis not present

## 2017-03-26 DIAGNOSIS — D509 Iron deficiency anemia, unspecified: Secondary | ICD-10-CM | POA: Diagnosis not present

## 2017-03-26 DIAGNOSIS — R739 Hyperglycemia, unspecified: Secondary | ICD-10-CM | POA: Diagnosis not present

## 2017-03-26 DIAGNOSIS — N186 End stage renal disease: Secondary | ICD-10-CM | POA: Diagnosis not present

## 2017-03-26 DIAGNOSIS — E1129 Type 2 diabetes mellitus with other diabetic kidney complication: Secondary | ICD-10-CM | POA: Diagnosis not present

## 2017-03-27 DIAGNOSIS — N186 End stage renal disease: Secondary | ICD-10-CM | POA: Diagnosis not present

## 2017-03-27 DIAGNOSIS — I129 Hypertensive chronic kidney disease with stage 1 through stage 4 chronic kidney disease, or unspecified chronic kidney disease: Secondary | ICD-10-CM | POA: Diagnosis not present

## 2017-03-27 DIAGNOSIS — Z992 Dependence on renal dialysis: Secondary | ICD-10-CM | POA: Diagnosis not present

## 2017-03-29 DIAGNOSIS — E1129 Type 2 diabetes mellitus with other diabetic kidney complication: Secondary | ICD-10-CM | POA: Diagnosis not present

## 2017-03-29 DIAGNOSIS — D631 Anemia in chronic kidney disease: Secondary | ICD-10-CM | POA: Diagnosis not present

## 2017-03-29 DIAGNOSIS — N186 End stage renal disease: Secondary | ICD-10-CM | POA: Diagnosis not present

## 2017-03-29 DIAGNOSIS — D509 Iron deficiency anemia, unspecified: Secondary | ICD-10-CM | POA: Diagnosis not present

## 2017-03-29 DIAGNOSIS — N2581 Secondary hyperparathyroidism of renal origin: Secondary | ICD-10-CM | POA: Diagnosis not present

## 2017-03-29 DIAGNOSIS — R739 Hyperglycemia, unspecified: Secondary | ICD-10-CM | POA: Diagnosis not present

## 2017-03-31 DIAGNOSIS — E1129 Type 2 diabetes mellitus with other diabetic kidney complication: Secondary | ICD-10-CM | POA: Diagnosis not present

## 2017-03-31 DIAGNOSIS — D509 Iron deficiency anemia, unspecified: Secondary | ICD-10-CM | POA: Diagnosis not present

## 2017-03-31 DIAGNOSIS — N2581 Secondary hyperparathyroidism of renal origin: Secondary | ICD-10-CM | POA: Diagnosis not present

## 2017-03-31 DIAGNOSIS — R739 Hyperglycemia, unspecified: Secondary | ICD-10-CM | POA: Diagnosis not present

## 2017-03-31 DIAGNOSIS — N186 End stage renal disease: Secondary | ICD-10-CM | POA: Diagnosis not present

## 2017-03-31 DIAGNOSIS — D631 Anemia in chronic kidney disease: Secondary | ICD-10-CM | POA: Diagnosis not present

## 2017-04-02 DIAGNOSIS — N2581 Secondary hyperparathyroidism of renal origin: Secondary | ICD-10-CM | POA: Diagnosis not present

## 2017-04-02 DIAGNOSIS — E1129 Type 2 diabetes mellitus with other diabetic kidney complication: Secondary | ICD-10-CM | POA: Diagnosis not present

## 2017-04-02 DIAGNOSIS — D631 Anemia in chronic kidney disease: Secondary | ICD-10-CM | POA: Diagnosis not present

## 2017-04-02 DIAGNOSIS — D509 Iron deficiency anemia, unspecified: Secondary | ICD-10-CM | POA: Diagnosis not present

## 2017-04-02 DIAGNOSIS — R739 Hyperglycemia, unspecified: Secondary | ICD-10-CM | POA: Diagnosis not present

## 2017-04-02 DIAGNOSIS — N186 End stage renal disease: Secondary | ICD-10-CM | POA: Diagnosis not present

## 2017-04-05 DIAGNOSIS — D509 Iron deficiency anemia, unspecified: Secondary | ICD-10-CM | POA: Diagnosis not present

## 2017-04-05 DIAGNOSIS — N2581 Secondary hyperparathyroidism of renal origin: Secondary | ICD-10-CM | POA: Diagnosis not present

## 2017-04-05 DIAGNOSIS — E1129 Type 2 diabetes mellitus with other diabetic kidney complication: Secondary | ICD-10-CM | POA: Diagnosis not present

## 2017-04-05 DIAGNOSIS — R739 Hyperglycemia, unspecified: Secondary | ICD-10-CM | POA: Diagnosis not present

## 2017-04-05 DIAGNOSIS — N186 End stage renal disease: Secondary | ICD-10-CM | POA: Diagnosis not present

## 2017-04-05 DIAGNOSIS — D631 Anemia in chronic kidney disease: Secondary | ICD-10-CM | POA: Diagnosis not present

## 2017-04-07 DIAGNOSIS — N186 End stage renal disease: Secondary | ICD-10-CM | POA: Diagnosis not present

## 2017-04-07 DIAGNOSIS — R739 Hyperglycemia, unspecified: Secondary | ICD-10-CM | POA: Diagnosis not present

## 2017-04-07 DIAGNOSIS — D509 Iron deficiency anemia, unspecified: Secondary | ICD-10-CM | POA: Diagnosis not present

## 2017-04-07 DIAGNOSIS — E1129 Type 2 diabetes mellitus with other diabetic kidney complication: Secondary | ICD-10-CM | POA: Diagnosis not present

## 2017-04-07 DIAGNOSIS — N2581 Secondary hyperparathyroidism of renal origin: Secondary | ICD-10-CM | POA: Diagnosis not present

## 2017-04-07 DIAGNOSIS — D631 Anemia in chronic kidney disease: Secondary | ICD-10-CM | POA: Diagnosis not present

## 2017-04-08 NOTE — Addendum Note (Signed)
Addendum  created 04/08/17 1556 by Josephine Igo, MD   Sign clinical note

## 2017-04-08 NOTE — Anesthesia Postprocedure Evaluation (Signed)
Anesthesia Post Note  Patient: [name]  Procedure(s) Performed: Procedure(s) (LRB): INSERTION OF ARTERIOVENOUS (AV) GORE-TEX GRAFT ARM (Right)     Anesthesia Post Evaluation  Last Vitals:  Vitals:   12/22/16 1215 12/22/16 1236  BP:    Pulse: 79   Resp: (!) 24   Temp:  36.5 C    Last Pain:  Vitals:   12/22/16 1106  TempSrc:   PainSc: Asleep                 [name] A.

## 2017-04-09 DIAGNOSIS — E1129 Type 2 diabetes mellitus with other diabetic kidney complication: Secondary | ICD-10-CM | POA: Diagnosis not present

## 2017-04-09 DIAGNOSIS — N186 End stage renal disease: Secondary | ICD-10-CM | POA: Diagnosis not present

## 2017-04-09 DIAGNOSIS — N2581 Secondary hyperparathyroidism of renal origin: Secondary | ICD-10-CM | POA: Diagnosis not present

## 2017-04-09 DIAGNOSIS — R739 Hyperglycemia, unspecified: Secondary | ICD-10-CM | POA: Diagnosis not present

## 2017-04-09 DIAGNOSIS — D631 Anemia in chronic kidney disease: Secondary | ICD-10-CM | POA: Diagnosis not present

## 2017-04-09 DIAGNOSIS — D509 Iron deficiency anemia, unspecified: Secondary | ICD-10-CM | POA: Diagnosis not present

## 2017-04-12 DIAGNOSIS — D631 Anemia in chronic kidney disease: Secondary | ICD-10-CM | POA: Diagnosis not present

## 2017-04-12 DIAGNOSIS — N2581 Secondary hyperparathyroidism of renal origin: Secondary | ICD-10-CM | POA: Diagnosis not present

## 2017-04-12 DIAGNOSIS — R739 Hyperglycemia, unspecified: Secondary | ICD-10-CM | POA: Diagnosis not present

## 2017-04-12 DIAGNOSIS — N186 End stage renal disease: Secondary | ICD-10-CM | POA: Diagnosis not present

## 2017-04-12 DIAGNOSIS — E1129 Type 2 diabetes mellitus with other diabetic kidney complication: Secondary | ICD-10-CM | POA: Diagnosis not present

## 2017-04-12 DIAGNOSIS — D509 Iron deficiency anemia, unspecified: Secondary | ICD-10-CM | POA: Diagnosis not present

## 2017-04-14 DIAGNOSIS — N186 End stage renal disease: Secondary | ICD-10-CM | POA: Diagnosis not present

## 2017-04-14 DIAGNOSIS — R739 Hyperglycemia, unspecified: Secondary | ICD-10-CM | POA: Diagnosis not present

## 2017-04-14 DIAGNOSIS — D509 Iron deficiency anemia, unspecified: Secondary | ICD-10-CM | POA: Diagnosis not present

## 2017-04-14 DIAGNOSIS — N2581 Secondary hyperparathyroidism of renal origin: Secondary | ICD-10-CM | POA: Diagnosis not present

## 2017-04-14 DIAGNOSIS — E1129 Type 2 diabetes mellitus with other diabetic kidney complication: Secondary | ICD-10-CM | POA: Diagnosis not present

## 2017-04-14 DIAGNOSIS — D631 Anemia in chronic kidney disease: Secondary | ICD-10-CM | POA: Diagnosis not present

## 2017-04-16 DIAGNOSIS — N2581 Secondary hyperparathyroidism of renal origin: Secondary | ICD-10-CM | POA: Diagnosis not present

## 2017-04-16 DIAGNOSIS — N186 End stage renal disease: Secondary | ICD-10-CM | POA: Diagnosis not present

## 2017-04-16 DIAGNOSIS — D509 Iron deficiency anemia, unspecified: Secondary | ICD-10-CM | POA: Diagnosis not present

## 2017-04-16 DIAGNOSIS — D631 Anemia in chronic kidney disease: Secondary | ICD-10-CM | POA: Diagnosis not present

## 2017-04-16 DIAGNOSIS — E1129 Type 2 diabetes mellitus with other diabetic kidney complication: Secondary | ICD-10-CM | POA: Diagnosis not present

## 2017-04-16 DIAGNOSIS — R739 Hyperglycemia, unspecified: Secondary | ICD-10-CM | POA: Diagnosis not present

## 2017-04-19 DIAGNOSIS — D509 Iron deficiency anemia, unspecified: Secondary | ICD-10-CM | POA: Diagnosis not present

## 2017-04-19 DIAGNOSIS — N186 End stage renal disease: Secondary | ICD-10-CM | POA: Diagnosis not present

## 2017-04-19 DIAGNOSIS — N2581 Secondary hyperparathyroidism of renal origin: Secondary | ICD-10-CM | POA: Diagnosis not present

## 2017-04-19 DIAGNOSIS — R739 Hyperglycemia, unspecified: Secondary | ICD-10-CM | POA: Diagnosis not present

## 2017-04-19 DIAGNOSIS — E1129 Type 2 diabetes mellitus with other diabetic kidney complication: Secondary | ICD-10-CM | POA: Diagnosis not present

## 2017-04-19 DIAGNOSIS — D631 Anemia in chronic kidney disease: Secondary | ICD-10-CM | POA: Diagnosis not present

## 2017-04-20 DIAGNOSIS — E103593 Type 1 diabetes mellitus with proliferative diabetic retinopathy without macular edema, bilateral: Secondary | ICD-10-CM | POA: Diagnosis not present

## 2017-04-20 DIAGNOSIS — H2513 Age-related nuclear cataract, bilateral: Secondary | ICD-10-CM | POA: Diagnosis not present

## 2017-04-20 DIAGNOSIS — H4311 Vitreous hemorrhage, right eye: Secondary | ICD-10-CM | POA: Diagnosis not present

## 2017-04-21 DIAGNOSIS — N2581 Secondary hyperparathyroidism of renal origin: Secondary | ICD-10-CM | POA: Diagnosis not present

## 2017-04-21 DIAGNOSIS — D509 Iron deficiency anemia, unspecified: Secondary | ICD-10-CM | POA: Diagnosis not present

## 2017-04-21 DIAGNOSIS — E1129 Type 2 diabetes mellitus with other diabetic kidney complication: Secondary | ICD-10-CM | POA: Diagnosis not present

## 2017-04-21 DIAGNOSIS — R739 Hyperglycemia, unspecified: Secondary | ICD-10-CM | POA: Diagnosis not present

## 2017-04-21 DIAGNOSIS — D631 Anemia in chronic kidney disease: Secondary | ICD-10-CM | POA: Diagnosis not present

## 2017-04-21 DIAGNOSIS — N186 End stage renal disease: Secondary | ICD-10-CM | POA: Diagnosis not present

## 2017-04-22 DIAGNOSIS — H25043 Posterior subcapsular polar age-related cataract, bilateral: Secondary | ICD-10-CM | POA: Diagnosis not present

## 2017-04-22 DIAGNOSIS — H35372 Puckering of macula, left eye: Secondary | ICD-10-CM | POA: Diagnosis not present

## 2017-04-22 DIAGNOSIS — E113512 Type 2 diabetes mellitus with proliferative diabetic retinopathy with macular edema, left eye: Secondary | ICD-10-CM | POA: Diagnosis not present

## 2017-04-22 DIAGNOSIS — H35032 Hypertensive retinopathy, left eye: Secondary | ICD-10-CM | POA: Diagnosis not present

## 2017-04-22 DIAGNOSIS — H4311 Vitreous hemorrhage, right eye: Secondary | ICD-10-CM | POA: Diagnosis not present

## 2017-04-22 DIAGNOSIS — E113521 Type 2 diabetes mellitus with proliferative diabetic retinopathy with traction retinal detachment involving the macula, right eye: Secondary | ICD-10-CM | POA: Diagnosis not present

## 2017-04-23 DIAGNOSIS — N2581 Secondary hyperparathyroidism of renal origin: Secondary | ICD-10-CM | POA: Diagnosis not present

## 2017-04-23 DIAGNOSIS — R739 Hyperglycemia, unspecified: Secondary | ICD-10-CM | POA: Diagnosis not present

## 2017-04-23 DIAGNOSIS — E1129 Type 2 diabetes mellitus with other diabetic kidney complication: Secondary | ICD-10-CM | POA: Diagnosis not present

## 2017-04-23 DIAGNOSIS — N186 End stage renal disease: Secondary | ICD-10-CM | POA: Diagnosis not present

## 2017-04-23 DIAGNOSIS — D509 Iron deficiency anemia, unspecified: Secondary | ICD-10-CM | POA: Diagnosis not present

## 2017-04-23 DIAGNOSIS — D631 Anemia in chronic kidney disease: Secondary | ICD-10-CM | POA: Diagnosis not present

## 2017-04-26 DIAGNOSIS — N186 End stage renal disease: Secondary | ICD-10-CM | POA: Diagnosis not present

## 2017-04-26 DIAGNOSIS — N2581 Secondary hyperparathyroidism of renal origin: Secondary | ICD-10-CM | POA: Diagnosis not present

## 2017-04-26 DIAGNOSIS — D509 Iron deficiency anemia, unspecified: Secondary | ICD-10-CM | POA: Diagnosis not present

## 2017-04-26 DIAGNOSIS — D631 Anemia in chronic kidney disease: Secondary | ICD-10-CM | POA: Diagnosis not present

## 2017-04-26 DIAGNOSIS — R739 Hyperglycemia, unspecified: Secondary | ICD-10-CM | POA: Diagnosis not present

## 2017-04-26 DIAGNOSIS — E1129 Type 2 diabetes mellitus with other diabetic kidney complication: Secondary | ICD-10-CM | POA: Diagnosis not present

## 2017-04-27 DIAGNOSIS — I129 Hypertensive chronic kidney disease with stage 1 through stage 4 chronic kidney disease, or unspecified chronic kidney disease: Secondary | ICD-10-CM | POA: Diagnosis not present

## 2017-04-27 DIAGNOSIS — Z992 Dependence on renal dialysis: Secondary | ICD-10-CM | POA: Diagnosis not present

## 2017-04-27 DIAGNOSIS — N186 End stage renal disease: Secondary | ICD-10-CM | POA: Diagnosis not present

## 2017-04-28 DIAGNOSIS — D509 Iron deficiency anemia, unspecified: Secondary | ICD-10-CM | POA: Diagnosis not present

## 2017-04-28 DIAGNOSIS — N186 End stage renal disease: Secondary | ICD-10-CM | POA: Diagnosis not present

## 2017-04-28 DIAGNOSIS — E1129 Type 2 diabetes mellitus with other diabetic kidney complication: Secondary | ICD-10-CM | POA: Diagnosis not present

## 2017-04-28 DIAGNOSIS — N2581 Secondary hyperparathyroidism of renal origin: Secondary | ICD-10-CM | POA: Diagnosis not present

## 2017-04-28 DIAGNOSIS — D631 Anemia in chronic kidney disease: Secondary | ICD-10-CM | POA: Diagnosis not present

## 2017-04-28 DIAGNOSIS — E1022 Type 1 diabetes mellitus with diabetic chronic kidney disease: Secondary | ICD-10-CM | POA: Diagnosis not present

## 2017-04-28 DIAGNOSIS — R739 Hyperglycemia, unspecified: Secondary | ICD-10-CM | POA: Diagnosis not present

## 2017-04-29 DIAGNOSIS — E113531 Type 2 diabetes mellitus with proliferative diabetic retinopathy with traction retinal detachment not involving the macula, right eye: Secondary | ICD-10-CM | POA: Diagnosis not present

## 2017-04-29 DIAGNOSIS — H4311 Vitreous hemorrhage, right eye: Secondary | ICD-10-CM | POA: Diagnosis not present

## 2017-04-29 DIAGNOSIS — E113521 Type 2 diabetes mellitus with proliferative diabetic retinopathy with traction retinal detachment involving the macula, right eye: Secondary | ICD-10-CM | POA: Diagnosis not present

## 2017-04-30 DIAGNOSIS — N186 End stage renal disease: Secondary | ICD-10-CM | POA: Diagnosis not present

## 2017-04-30 DIAGNOSIS — E1022 Type 1 diabetes mellitus with diabetic chronic kidney disease: Secondary | ICD-10-CM | POA: Diagnosis not present

## 2017-04-30 DIAGNOSIS — R739 Hyperglycemia, unspecified: Secondary | ICD-10-CM | POA: Diagnosis not present

## 2017-04-30 DIAGNOSIS — N2581 Secondary hyperparathyroidism of renal origin: Secondary | ICD-10-CM | POA: Diagnosis not present

## 2017-04-30 DIAGNOSIS — E1129 Type 2 diabetes mellitus with other diabetic kidney complication: Secondary | ICD-10-CM | POA: Diagnosis not present

## 2017-04-30 DIAGNOSIS — D509 Iron deficiency anemia, unspecified: Secondary | ICD-10-CM | POA: Diagnosis not present

## 2017-05-03 DIAGNOSIS — R739 Hyperglycemia, unspecified: Secondary | ICD-10-CM | POA: Diagnosis not present

## 2017-05-03 DIAGNOSIS — N186 End stage renal disease: Secondary | ICD-10-CM | POA: Diagnosis not present

## 2017-05-03 DIAGNOSIS — D509 Iron deficiency anemia, unspecified: Secondary | ICD-10-CM | POA: Diagnosis not present

## 2017-05-03 DIAGNOSIS — E1129 Type 2 diabetes mellitus with other diabetic kidney complication: Secondary | ICD-10-CM | POA: Diagnosis not present

## 2017-05-03 DIAGNOSIS — N2581 Secondary hyperparathyroidism of renal origin: Secondary | ICD-10-CM | POA: Diagnosis not present

## 2017-05-03 DIAGNOSIS — E1022 Type 1 diabetes mellitus with diabetic chronic kidney disease: Secondary | ICD-10-CM | POA: Diagnosis not present

## 2017-05-05 DIAGNOSIS — N186 End stage renal disease: Secondary | ICD-10-CM | POA: Diagnosis not present

## 2017-05-05 DIAGNOSIS — D509 Iron deficiency anemia, unspecified: Secondary | ICD-10-CM | POA: Diagnosis not present

## 2017-05-05 DIAGNOSIS — E1129 Type 2 diabetes mellitus with other diabetic kidney complication: Secondary | ICD-10-CM | POA: Diagnosis not present

## 2017-05-05 DIAGNOSIS — E1022 Type 1 diabetes mellitus with diabetic chronic kidney disease: Secondary | ICD-10-CM | POA: Diagnosis not present

## 2017-05-05 DIAGNOSIS — N2581 Secondary hyperparathyroidism of renal origin: Secondary | ICD-10-CM | POA: Diagnosis not present

## 2017-05-05 DIAGNOSIS — R739 Hyperglycemia, unspecified: Secondary | ICD-10-CM | POA: Diagnosis not present

## 2017-05-06 DIAGNOSIS — E113512 Type 2 diabetes mellitus with proliferative diabetic retinopathy with macular edema, left eye: Secondary | ICD-10-CM | POA: Diagnosis not present

## 2017-05-07 DIAGNOSIS — D509 Iron deficiency anemia, unspecified: Secondary | ICD-10-CM | POA: Diagnosis not present

## 2017-05-07 DIAGNOSIS — R739 Hyperglycemia, unspecified: Secondary | ICD-10-CM | POA: Diagnosis not present

## 2017-05-07 DIAGNOSIS — N186 End stage renal disease: Secondary | ICD-10-CM | POA: Diagnosis not present

## 2017-05-07 DIAGNOSIS — E1129 Type 2 diabetes mellitus with other diabetic kidney complication: Secondary | ICD-10-CM | POA: Diagnosis not present

## 2017-05-07 DIAGNOSIS — E1022 Type 1 diabetes mellitus with diabetic chronic kidney disease: Secondary | ICD-10-CM | POA: Diagnosis not present

## 2017-05-07 DIAGNOSIS — N2581 Secondary hyperparathyroidism of renal origin: Secondary | ICD-10-CM | POA: Diagnosis not present

## 2017-05-10 DIAGNOSIS — R739 Hyperglycemia, unspecified: Secondary | ICD-10-CM | POA: Diagnosis not present

## 2017-05-10 DIAGNOSIS — N186 End stage renal disease: Secondary | ICD-10-CM | POA: Diagnosis not present

## 2017-05-10 DIAGNOSIS — N2581 Secondary hyperparathyroidism of renal origin: Secondary | ICD-10-CM | POA: Diagnosis not present

## 2017-05-10 DIAGNOSIS — E1022 Type 1 diabetes mellitus with diabetic chronic kidney disease: Secondary | ICD-10-CM | POA: Diagnosis not present

## 2017-05-10 DIAGNOSIS — D509 Iron deficiency anemia, unspecified: Secondary | ICD-10-CM | POA: Diagnosis not present

## 2017-05-10 DIAGNOSIS — E1129 Type 2 diabetes mellitus with other diabetic kidney complication: Secondary | ICD-10-CM | POA: Diagnosis not present

## 2017-05-12 DIAGNOSIS — E1129 Type 2 diabetes mellitus with other diabetic kidney complication: Secondary | ICD-10-CM | POA: Diagnosis not present

## 2017-05-12 DIAGNOSIS — R739 Hyperglycemia, unspecified: Secondary | ICD-10-CM | POA: Diagnosis not present

## 2017-05-12 DIAGNOSIS — E1022 Type 1 diabetes mellitus with diabetic chronic kidney disease: Secondary | ICD-10-CM | POA: Diagnosis not present

## 2017-05-12 DIAGNOSIS — N2581 Secondary hyperparathyroidism of renal origin: Secondary | ICD-10-CM | POA: Diagnosis not present

## 2017-05-12 DIAGNOSIS — N186 End stage renal disease: Secondary | ICD-10-CM | POA: Diagnosis not present

## 2017-05-12 DIAGNOSIS — D509 Iron deficiency anemia, unspecified: Secondary | ICD-10-CM | POA: Diagnosis not present

## 2017-05-14 DIAGNOSIS — R739 Hyperglycemia, unspecified: Secondary | ICD-10-CM | POA: Diagnosis not present

## 2017-05-14 DIAGNOSIS — E1022 Type 1 diabetes mellitus with diabetic chronic kidney disease: Secondary | ICD-10-CM | POA: Diagnosis not present

## 2017-05-14 DIAGNOSIS — N186 End stage renal disease: Secondary | ICD-10-CM | POA: Diagnosis not present

## 2017-05-14 DIAGNOSIS — D509 Iron deficiency anemia, unspecified: Secondary | ICD-10-CM | POA: Diagnosis not present

## 2017-05-14 DIAGNOSIS — E1129 Type 2 diabetes mellitus with other diabetic kidney complication: Secondary | ICD-10-CM | POA: Diagnosis not present

## 2017-05-14 DIAGNOSIS — N2581 Secondary hyperparathyroidism of renal origin: Secondary | ICD-10-CM | POA: Diagnosis not present

## 2017-05-17 DIAGNOSIS — D509 Iron deficiency anemia, unspecified: Secondary | ICD-10-CM | POA: Diagnosis not present

## 2017-05-17 DIAGNOSIS — N186 End stage renal disease: Secondary | ICD-10-CM | POA: Diagnosis not present

## 2017-05-17 DIAGNOSIS — R739 Hyperglycemia, unspecified: Secondary | ICD-10-CM | POA: Diagnosis not present

## 2017-05-17 DIAGNOSIS — E1022 Type 1 diabetes mellitus with diabetic chronic kidney disease: Secondary | ICD-10-CM | POA: Diagnosis not present

## 2017-05-17 DIAGNOSIS — N2581 Secondary hyperparathyroidism of renal origin: Secondary | ICD-10-CM | POA: Diagnosis not present

## 2017-05-17 DIAGNOSIS — E1129 Type 2 diabetes mellitus with other diabetic kidney complication: Secondary | ICD-10-CM | POA: Diagnosis not present

## 2017-05-19 DIAGNOSIS — E1022 Type 1 diabetes mellitus with diabetic chronic kidney disease: Secondary | ICD-10-CM | POA: Diagnosis not present

## 2017-05-19 DIAGNOSIS — N186 End stage renal disease: Secondary | ICD-10-CM | POA: Diagnosis not present

## 2017-05-19 DIAGNOSIS — N2581 Secondary hyperparathyroidism of renal origin: Secondary | ICD-10-CM | POA: Diagnosis not present

## 2017-05-19 DIAGNOSIS — D509 Iron deficiency anemia, unspecified: Secondary | ICD-10-CM | POA: Diagnosis not present

## 2017-05-19 DIAGNOSIS — E1129 Type 2 diabetes mellitus with other diabetic kidney complication: Secondary | ICD-10-CM | POA: Diagnosis not present

## 2017-05-19 DIAGNOSIS — R739 Hyperglycemia, unspecified: Secondary | ICD-10-CM | POA: Diagnosis not present

## 2017-05-21 DIAGNOSIS — E1129 Type 2 diabetes mellitus with other diabetic kidney complication: Secondary | ICD-10-CM | POA: Diagnosis not present

## 2017-05-21 DIAGNOSIS — D509 Iron deficiency anemia, unspecified: Secondary | ICD-10-CM | POA: Diagnosis not present

## 2017-05-21 DIAGNOSIS — R739 Hyperglycemia, unspecified: Secondary | ICD-10-CM | POA: Diagnosis not present

## 2017-05-21 DIAGNOSIS — N2581 Secondary hyperparathyroidism of renal origin: Secondary | ICD-10-CM | POA: Diagnosis not present

## 2017-05-21 DIAGNOSIS — N186 End stage renal disease: Secondary | ICD-10-CM | POA: Diagnosis not present

## 2017-05-21 DIAGNOSIS — E1022 Type 1 diabetes mellitus with diabetic chronic kidney disease: Secondary | ICD-10-CM | POA: Diagnosis not present

## 2017-05-24 DIAGNOSIS — D509 Iron deficiency anemia, unspecified: Secondary | ICD-10-CM | POA: Diagnosis not present

## 2017-05-24 DIAGNOSIS — E1022 Type 1 diabetes mellitus with diabetic chronic kidney disease: Secondary | ICD-10-CM | POA: Diagnosis not present

## 2017-05-24 DIAGNOSIS — E1129 Type 2 diabetes mellitus with other diabetic kidney complication: Secondary | ICD-10-CM | POA: Diagnosis not present

## 2017-05-24 DIAGNOSIS — N2581 Secondary hyperparathyroidism of renal origin: Secondary | ICD-10-CM | POA: Diagnosis not present

## 2017-05-24 DIAGNOSIS — R739 Hyperglycemia, unspecified: Secondary | ICD-10-CM | POA: Diagnosis not present

## 2017-05-24 DIAGNOSIS — N186 End stage renal disease: Secondary | ICD-10-CM | POA: Diagnosis not present

## 2017-05-26 DIAGNOSIS — N2581 Secondary hyperparathyroidism of renal origin: Secondary | ICD-10-CM | POA: Diagnosis not present

## 2017-05-26 DIAGNOSIS — E1129 Type 2 diabetes mellitus with other diabetic kidney complication: Secondary | ICD-10-CM | POA: Diagnosis not present

## 2017-05-26 DIAGNOSIS — E1022 Type 1 diabetes mellitus with diabetic chronic kidney disease: Secondary | ICD-10-CM | POA: Diagnosis not present

## 2017-05-26 DIAGNOSIS — D509 Iron deficiency anemia, unspecified: Secondary | ICD-10-CM | POA: Diagnosis not present

## 2017-05-26 DIAGNOSIS — N186 End stage renal disease: Secondary | ICD-10-CM | POA: Diagnosis not present

## 2017-05-26 DIAGNOSIS — R739 Hyperglycemia, unspecified: Secondary | ICD-10-CM | POA: Diagnosis not present

## 2017-05-28 DIAGNOSIS — D509 Iron deficiency anemia, unspecified: Secondary | ICD-10-CM | POA: Diagnosis not present

## 2017-05-28 DIAGNOSIS — I129 Hypertensive chronic kidney disease with stage 1 through stage 4 chronic kidney disease, or unspecified chronic kidney disease: Secondary | ICD-10-CM | POA: Diagnosis not present

## 2017-05-28 DIAGNOSIS — Z992 Dependence on renal dialysis: Secondary | ICD-10-CM | POA: Diagnosis not present

## 2017-05-28 DIAGNOSIS — R739 Hyperglycemia, unspecified: Secondary | ICD-10-CM | POA: Diagnosis not present

## 2017-05-28 DIAGNOSIS — E1022 Type 1 diabetes mellitus with diabetic chronic kidney disease: Secondary | ICD-10-CM | POA: Diagnosis not present

## 2017-05-28 DIAGNOSIS — E1129 Type 2 diabetes mellitus with other diabetic kidney complication: Secondary | ICD-10-CM | POA: Diagnosis not present

## 2017-05-28 DIAGNOSIS — N186 End stage renal disease: Secondary | ICD-10-CM | POA: Diagnosis not present

## 2017-05-28 DIAGNOSIS — N2581 Secondary hyperparathyroidism of renal origin: Secondary | ICD-10-CM | POA: Diagnosis not present

## 2017-05-31 DIAGNOSIS — E1022 Type 1 diabetes mellitus with diabetic chronic kidney disease: Secondary | ICD-10-CM | POA: Diagnosis not present

## 2017-05-31 DIAGNOSIS — D631 Anemia in chronic kidney disease: Secondary | ICD-10-CM | POA: Diagnosis not present

## 2017-05-31 DIAGNOSIS — N2581 Secondary hyperparathyroidism of renal origin: Secondary | ICD-10-CM | POA: Diagnosis not present

## 2017-05-31 DIAGNOSIS — D509 Iron deficiency anemia, unspecified: Secondary | ICD-10-CM | POA: Diagnosis not present

## 2017-05-31 DIAGNOSIS — N186 End stage renal disease: Secondary | ICD-10-CM | POA: Diagnosis not present

## 2017-06-02 DIAGNOSIS — E1022 Type 1 diabetes mellitus with diabetic chronic kidney disease: Secondary | ICD-10-CM | POA: Diagnosis not present

## 2017-06-02 DIAGNOSIS — D509 Iron deficiency anemia, unspecified: Secondary | ICD-10-CM | POA: Diagnosis not present

## 2017-06-02 DIAGNOSIS — N186 End stage renal disease: Secondary | ICD-10-CM | POA: Diagnosis not present

## 2017-06-02 DIAGNOSIS — D631 Anemia in chronic kidney disease: Secondary | ICD-10-CM | POA: Diagnosis not present

## 2017-06-02 DIAGNOSIS — N2581 Secondary hyperparathyroidism of renal origin: Secondary | ICD-10-CM | POA: Diagnosis not present

## 2017-06-04 DIAGNOSIS — D509 Iron deficiency anemia, unspecified: Secondary | ICD-10-CM | POA: Diagnosis not present

## 2017-06-04 DIAGNOSIS — D631 Anemia in chronic kidney disease: Secondary | ICD-10-CM | POA: Diagnosis not present

## 2017-06-04 DIAGNOSIS — N186 End stage renal disease: Secondary | ICD-10-CM | POA: Diagnosis not present

## 2017-06-04 DIAGNOSIS — E1022 Type 1 diabetes mellitus with diabetic chronic kidney disease: Secondary | ICD-10-CM | POA: Diagnosis not present

## 2017-06-04 DIAGNOSIS — N2581 Secondary hyperparathyroidism of renal origin: Secondary | ICD-10-CM | POA: Diagnosis not present

## 2017-06-07 DIAGNOSIS — D509 Iron deficiency anemia, unspecified: Secondary | ICD-10-CM | POA: Diagnosis not present

## 2017-06-07 DIAGNOSIS — N186 End stage renal disease: Secondary | ICD-10-CM | POA: Diagnosis not present

## 2017-06-07 DIAGNOSIS — D631 Anemia in chronic kidney disease: Secondary | ICD-10-CM | POA: Diagnosis not present

## 2017-06-07 DIAGNOSIS — N2581 Secondary hyperparathyroidism of renal origin: Secondary | ICD-10-CM | POA: Diagnosis not present

## 2017-06-07 DIAGNOSIS — E1022 Type 1 diabetes mellitus with diabetic chronic kidney disease: Secondary | ICD-10-CM | POA: Diagnosis not present

## 2017-06-09 DIAGNOSIS — D509 Iron deficiency anemia, unspecified: Secondary | ICD-10-CM | POA: Diagnosis not present

## 2017-06-09 DIAGNOSIS — N2581 Secondary hyperparathyroidism of renal origin: Secondary | ICD-10-CM | POA: Diagnosis not present

## 2017-06-09 DIAGNOSIS — N186 End stage renal disease: Secondary | ICD-10-CM | POA: Diagnosis not present

## 2017-06-09 DIAGNOSIS — D631 Anemia in chronic kidney disease: Secondary | ICD-10-CM | POA: Diagnosis not present

## 2017-06-09 DIAGNOSIS — E1022 Type 1 diabetes mellitus with diabetic chronic kidney disease: Secondary | ICD-10-CM | POA: Diagnosis not present

## 2017-06-11 DIAGNOSIS — N2581 Secondary hyperparathyroidism of renal origin: Secondary | ICD-10-CM | POA: Diagnosis not present

## 2017-06-11 DIAGNOSIS — E1022 Type 1 diabetes mellitus with diabetic chronic kidney disease: Secondary | ICD-10-CM | POA: Diagnosis not present

## 2017-06-11 DIAGNOSIS — N186 End stage renal disease: Secondary | ICD-10-CM | POA: Diagnosis not present

## 2017-06-11 DIAGNOSIS — D509 Iron deficiency anemia, unspecified: Secondary | ICD-10-CM | POA: Diagnosis not present

## 2017-06-11 DIAGNOSIS — D631 Anemia in chronic kidney disease: Secondary | ICD-10-CM | POA: Diagnosis not present

## 2017-06-14 DIAGNOSIS — D631 Anemia in chronic kidney disease: Secondary | ICD-10-CM | POA: Diagnosis not present

## 2017-06-14 DIAGNOSIS — E1022 Type 1 diabetes mellitus with diabetic chronic kidney disease: Secondary | ICD-10-CM | POA: Diagnosis not present

## 2017-06-14 DIAGNOSIS — D509 Iron deficiency anemia, unspecified: Secondary | ICD-10-CM | POA: Diagnosis not present

## 2017-06-14 DIAGNOSIS — N186 End stage renal disease: Secondary | ICD-10-CM | POA: Diagnosis not present

## 2017-06-14 DIAGNOSIS — N2581 Secondary hyperparathyroidism of renal origin: Secondary | ICD-10-CM | POA: Diagnosis not present

## 2017-06-16 DIAGNOSIS — D509 Iron deficiency anemia, unspecified: Secondary | ICD-10-CM | POA: Diagnosis not present

## 2017-06-16 DIAGNOSIS — N186 End stage renal disease: Secondary | ICD-10-CM | POA: Diagnosis not present

## 2017-06-16 DIAGNOSIS — N2581 Secondary hyperparathyroidism of renal origin: Secondary | ICD-10-CM | POA: Diagnosis not present

## 2017-06-16 DIAGNOSIS — E1022 Type 1 diabetes mellitus with diabetic chronic kidney disease: Secondary | ICD-10-CM | POA: Diagnosis not present

## 2017-06-16 DIAGNOSIS — D631 Anemia in chronic kidney disease: Secondary | ICD-10-CM | POA: Diagnosis not present

## 2017-06-18 DIAGNOSIS — E1022 Type 1 diabetes mellitus with diabetic chronic kidney disease: Secondary | ICD-10-CM | POA: Diagnosis not present

## 2017-06-18 DIAGNOSIS — N2581 Secondary hyperparathyroidism of renal origin: Secondary | ICD-10-CM | POA: Diagnosis not present

## 2017-06-18 DIAGNOSIS — D509 Iron deficiency anemia, unspecified: Secondary | ICD-10-CM | POA: Diagnosis not present

## 2017-06-18 DIAGNOSIS — D631 Anemia in chronic kidney disease: Secondary | ICD-10-CM | POA: Diagnosis not present

## 2017-06-18 DIAGNOSIS — N186 End stage renal disease: Secondary | ICD-10-CM | POA: Diagnosis not present

## 2017-06-21 DIAGNOSIS — N186 End stage renal disease: Secondary | ICD-10-CM | POA: Diagnosis not present

## 2017-06-21 DIAGNOSIS — N2581 Secondary hyperparathyroidism of renal origin: Secondary | ICD-10-CM | POA: Diagnosis not present

## 2017-06-21 DIAGNOSIS — D509 Iron deficiency anemia, unspecified: Secondary | ICD-10-CM | POA: Diagnosis not present

## 2017-06-21 DIAGNOSIS — E1022 Type 1 diabetes mellitus with diabetic chronic kidney disease: Secondary | ICD-10-CM | POA: Diagnosis not present

## 2017-06-21 DIAGNOSIS — D631 Anemia in chronic kidney disease: Secondary | ICD-10-CM | POA: Diagnosis not present

## 2017-06-23 DIAGNOSIS — N2581 Secondary hyperparathyroidism of renal origin: Secondary | ICD-10-CM | POA: Diagnosis not present

## 2017-06-23 DIAGNOSIS — E1022 Type 1 diabetes mellitus with diabetic chronic kidney disease: Secondary | ICD-10-CM | POA: Diagnosis not present

## 2017-06-23 DIAGNOSIS — D509 Iron deficiency anemia, unspecified: Secondary | ICD-10-CM | POA: Diagnosis not present

## 2017-06-23 DIAGNOSIS — D631 Anemia in chronic kidney disease: Secondary | ICD-10-CM | POA: Diagnosis not present

## 2017-06-23 DIAGNOSIS — N186 End stage renal disease: Secondary | ICD-10-CM | POA: Diagnosis not present

## 2017-06-24 DIAGNOSIS — H3582 Retinal ischemia: Secondary | ICD-10-CM | POA: Diagnosis not present

## 2017-06-24 DIAGNOSIS — H35032 Hypertensive retinopathy, left eye: Secondary | ICD-10-CM | POA: Diagnosis not present

## 2017-06-24 DIAGNOSIS — E113511 Type 2 diabetes mellitus with proliferative diabetic retinopathy with macular edema, right eye: Secondary | ICD-10-CM | POA: Diagnosis not present

## 2017-06-24 DIAGNOSIS — E113592 Type 2 diabetes mellitus with proliferative diabetic retinopathy without macular edema, left eye: Secondary | ICD-10-CM | POA: Diagnosis not present

## 2017-06-24 DIAGNOSIS — H31093 Other chorioretinal scars, bilateral: Secondary | ICD-10-CM | POA: Diagnosis not present

## 2017-06-25 DIAGNOSIS — E1022 Type 1 diabetes mellitus with diabetic chronic kidney disease: Secondary | ICD-10-CM | POA: Diagnosis not present

## 2017-06-25 DIAGNOSIS — N2581 Secondary hyperparathyroidism of renal origin: Secondary | ICD-10-CM | POA: Diagnosis not present

## 2017-06-25 DIAGNOSIS — N186 End stage renal disease: Secondary | ICD-10-CM | POA: Diagnosis not present

## 2017-06-25 DIAGNOSIS — D509 Iron deficiency anemia, unspecified: Secondary | ICD-10-CM | POA: Diagnosis not present

## 2017-06-25 DIAGNOSIS — D631 Anemia in chronic kidney disease: Secondary | ICD-10-CM | POA: Diagnosis not present

## 2017-06-27 DIAGNOSIS — I129 Hypertensive chronic kidney disease with stage 1 through stage 4 chronic kidney disease, or unspecified chronic kidney disease: Secondary | ICD-10-CM | POA: Diagnosis not present

## 2017-06-27 DIAGNOSIS — N186 End stage renal disease: Secondary | ICD-10-CM | POA: Diagnosis not present

## 2017-06-27 DIAGNOSIS — Z992 Dependence on renal dialysis: Secondary | ICD-10-CM | POA: Diagnosis not present

## 2017-06-28 DIAGNOSIS — E1022 Type 1 diabetes mellitus with diabetic chronic kidney disease: Secondary | ICD-10-CM | POA: Diagnosis not present

## 2017-06-28 DIAGNOSIS — D509 Iron deficiency anemia, unspecified: Secondary | ICD-10-CM | POA: Diagnosis not present

## 2017-06-28 DIAGNOSIS — N186 End stage renal disease: Secondary | ICD-10-CM | POA: Diagnosis not present

## 2017-06-28 DIAGNOSIS — N2581 Secondary hyperparathyroidism of renal origin: Secondary | ICD-10-CM | POA: Diagnosis not present

## 2017-06-30 DIAGNOSIS — D509 Iron deficiency anemia, unspecified: Secondary | ICD-10-CM | POA: Diagnosis not present

## 2017-06-30 DIAGNOSIS — E1022 Type 1 diabetes mellitus with diabetic chronic kidney disease: Secondary | ICD-10-CM | POA: Diagnosis not present

## 2017-06-30 DIAGNOSIS — N2581 Secondary hyperparathyroidism of renal origin: Secondary | ICD-10-CM | POA: Diagnosis not present

## 2017-06-30 DIAGNOSIS — N186 End stage renal disease: Secondary | ICD-10-CM | POA: Diagnosis not present

## 2017-07-02 DIAGNOSIS — D509 Iron deficiency anemia, unspecified: Secondary | ICD-10-CM | POA: Diagnosis not present

## 2017-07-02 DIAGNOSIS — N186 End stage renal disease: Secondary | ICD-10-CM | POA: Diagnosis not present

## 2017-07-02 DIAGNOSIS — E1022 Type 1 diabetes mellitus with diabetic chronic kidney disease: Secondary | ICD-10-CM | POA: Diagnosis not present

## 2017-07-02 DIAGNOSIS — N2581 Secondary hyperparathyroidism of renal origin: Secondary | ICD-10-CM | POA: Diagnosis not present

## 2017-07-05 DIAGNOSIS — D509 Iron deficiency anemia, unspecified: Secondary | ICD-10-CM | POA: Diagnosis not present

## 2017-07-05 DIAGNOSIS — E1022 Type 1 diabetes mellitus with diabetic chronic kidney disease: Secondary | ICD-10-CM | POA: Diagnosis not present

## 2017-07-05 DIAGNOSIS — N2581 Secondary hyperparathyroidism of renal origin: Secondary | ICD-10-CM | POA: Diagnosis not present

## 2017-07-05 DIAGNOSIS — N186 End stage renal disease: Secondary | ICD-10-CM | POA: Diagnosis not present

## 2017-07-07 DIAGNOSIS — N2581 Secondary hyperparathyroidism of renal origin: Secondary | ICD-10-CM | POA: Diagnosis not present

## 2017-07-07 DIAGNOSIS — D509 Iron deficiency anemia, unspecified: Secondary | ICD-10-CM | POA: Diagnosis not present

## 2017-07-07 DIAGNOSIS — E1022 Type 1 diabetes mellitus with diabetic chronic kidney disease: Secondary | ICD-10-CM | POA: Diagnosis not present

## 2017-07-07 DIAGNOSIS — E1129 Type 2 diabetes mellitus with other diabetic kidney complication: Secondary | ICD-10-CM | POA: Diagnosis not present

## 2017-07-07 DIAGNOSIS — N186 End stage renal disease: Secondary | ICD-10-CM | POA: Diagnosis not present

## 2017-07-09 DIAGNOSIS — D509 Iron deficiency anemia, unspecified: Secondary | ICD-10-CM | POA: Diagnosis not present

## 2017-07-09 DIAGNOSIS — E1022 Type 1 diabetes mellitus with diabetic chronic kidney disease: Secondary | ICD-10-CM | POA: Diagnosis not present

## 2017-07-09 DIAGNOSIS — N2581 Secondary hyperparathyroidism of renal origin: Secondary | ICD-10-CM | POA: Diagnosis not present

## 2017-07-09 DIAGNOSIS — N186 End stage renal disease: Secondary | ICD-10-CM | POA: Diagnosis not present

## 2017-07-12 DIAGNOSIS — N2581 Secondary hyperparathyroidism of renal origin: Secondary | ICD-10-CM | POA: Diagnosis not present

## 2017-07-12 DIAGNOSIS — N186 End stage renal disease: Secondary | ICD-10-CM | POA: Diagnosis not present

## 2017-07-12 DIAGNOSIS — E1022 Type 1 diabetes mellitus with diabetic chronic kidney disease: Secondary | ICD-10-CM | POA: Diagnosis not present

## 2017-07-12 DIAGNOSIS — D509 Iron deficiency anemia, unspecified: Secondary | ICD-10-CM | POA: Diagnosis not present

## 2017-07-14 DIAGNOSIS — N2581 Secondary hyperparathyroidism of renal origin: Secondary | ICD-10-CM | POA: Diagnosis not present

## 2017-07-14 DIAGNOSIS — D509 Iron deficiency anemia, unspecified: Secondary | ICD-10-CM | POA: Diagnosis not present

## 2017-07-14 DIAGNOSIS — N186 End stage renal disease: Secondary | ICD-10-CM | POA: Diagnosis not present

## 2017-07-14 DIAGNOSIS — E1022 Type 1 diabetes mellitus with diabetic chronic kidney disease: Secondary | ICD-10-CM | POA: Diagnosis not present

## 2017-07-16 DIAGNOSIS — N186 End stage renal disease: Secondary | ICD-10-CM | POA: Diagnosis not present

## 2017-07-16 DIAGNOSIS — D509 Iron deficiency anemia, unspecified: Secondary | ICD-10-CM | POA: Diagnosis not present

## 2017-07-16 DIAGNOSIS — N2581 Secondary hyperparathyroidism of renal origin: Secondary | ICD-10-CM | POA: Diagnosis not present

## 2017-07-16 DIAGNOSIS — E1022 Type 1 diabetes mellitus with diabetic chronic kidney disease: Secondary | ICD-10-CM | POA: Diagnosis not present

## 2017-07-19 DIAGNOSIS — D509 Iron deficiency anemia, unspecified: Secondary | ICD-10-CM | POA: Diagnosis not present

## 2017-07-19 DIAGNOSIS — N186 End stage renal disease: Secondary | ICD-10-CM | POA: Diagnosis not present

## 2017-07-19 DIAGNOSIS — E1022 Type 1 diabetes mellitus with diabetic chronic kidney disease: Secondary | ICD-10-CM | POA: Diagnosis not present

## 2017-07-19 DIAGNOSIS — N2581 Secondary hyperparathyroidism of renal origin: Secondary | ICD-10-CM | POA: Diagnosis not present

## 2017-07-21 DIAGNOSIS — N2581 Secondary hyperparathyroidism of renal origin: Secondary | ICD-10-CM | POA: Diagnosis not present

## 2017-07-21 DIAGNOSIS — D509 Iron deficiency anemia, unspecified: Secondary | ICD-10-CM | POA: Diagnosis not present

## 2017-07-21 DIAGNOSIS — N186 End stage renal disease: Secondary | ICD-10-CM | POA: Diagnosis not present

## 2017-07-21 DIAGNOSIS — E1022 Type 1 diabetes mellitus with diabetic chronic kidney disease: Secondary | ICD-10-CM | POA: Diagnosis not present

## 2017-07-23 DIAGNOSIS — D509 Iron deficiency anemia, unspecified: Secondary | ICD-10-CM | POA: Diagnosis not present

## 2017-07-23 DIAGNOSIS — N186 End stage renal disease: Secondary | ICD-10-CM | POA: Diagnosis not present

## 2017-07-23 DIAGNOSIS — N2581 Secondary hyperparathyroidism of renal origin: Secondary | ICD-10-CM | POA: Diagnosis not present

## 2017-07-23 DIAGNOSIS — E1022 Type 1 diabetes mellitus with diabetic chronic kidney disease: Secondary | ICD-10-CM | POA: Diagnosis not present

## 2017-07-26 DIAGNOSIS — E1022 Type 1 diabetes mellitus with diabetic chronic kidney disease: Secondary | ICD-10-CM | POA: Diagnosis not present

## 2017-07-26 DIAGNOSIS — D509 Iron deficiency anemia, unspecified: Secondary | ICD-10-CM | POA: Diagnosis not present

## 2017-07-26 DIAGNOSIS — N2581 Secondary hyperparathyroidism of renal origin: Secondary | ICD-10-CM | POA: Diagnosis not present

## 2017-07-26 DIAGNOSIS — N186 End stage renal disease: Secondary | ICD-10-CM | POA: Diagnosis not present

## 2017-07-28 DIAGNOSIS — E1022 Type 1 diabetes mellitus with diabetic chronic kidney disease: Secondary | ICD-10-CM | POA: Diagnosis not present

## 2017-07-28 DIAGNOSIS — N2581 Secondary hyperparathyroidism of renal origin: Secondary | ICD-10-CM | POA: Diagnosis not present

## 2017-07-28 DIAGNOSIS — Z992 Dependence on renal dialysis: Secondary | ICD-10-CM | POA: Diagnosis not present

## 2017-07-28 DIAGNOSIS — I129 Hypertensive chronic kidney disease with stage 1 through stage 4 chronic kidney disease, or unspecified chronic kidney disease: Secondary | ICD-10-CM | POA: Diagnosis not present

## 2017-07-28 DIAGNOSIS — D509 Iron deficiency anemia, unspecified: Secondary | ICD-10-CM | POA: Diagnosis not present

## 2017-07-28 DIAGNOSIS — N186 End stage renal disease: Secondary | ICD-10-CM | POA: Diagnosis not present

## 2017-07-30 DIAGNOSIS — N186 End stage renal disease: Secondary | ICD-10-CM | POA: Diagnosis not present

## 2017-07-30 DIAGNOSIS — D631 Anemia in chronic kidney disease: Secondary | ICD-10-CM | POA: Diagnosis not present

## 2017-07-30 DIAGNOSIS — D509 Iron deficiency anemia, unspecified: Secondary | ICD-10-CM | POA: Diagnosis not present

## 2017-07-30 DIAGNOSIS — E1022 Type 1 diabetes mellitus with diabetic chronic kidney disease: Secondary | ICD-10-CM | POA: Diagnosis not present

## 2017-07-30 DIAGNOSIS — N2581 Secondary hyperparathyroidism of renal origin: Secondary | ICD-10-CM | POA: Diagnosis not present

## 2017-08-02 DIAGNOSIS — N2581 Secondary hyperparathyroidism of renal origin: Secondary | ICD-10-CM | POA: Diagnosis not present

## 2017-08-02 DIAGNOSIS — D509 Iron deficiency anemia, unspecified: Secondary | ICD-10-CM | POA: Diagnosis not present

## 2017-08-02 DIAGNOSIS — D631 Anemia in chronic kidney disease: Secondary | ICD-10-CM | POA: Diagnosis not present

## 2017-08-02 DIAGNOSIS — E1022 Type 1 diabetes mellitus with diabetic chronic kidney disease: Secondary | ICD-10-CM | POA: Diagnosis not present

## 2017-08-02 DIAGNOSIS — N186 End stage renal disease: Secondary | ICD-10-CM | POA: Diagnosis not present

## 2017-08-04 DIAGNOSIS — E1022 Type 1 diabetes mellitus with diabetic chronic kidney disease: Secondary | ICD-10-CM | POA: Diagnosis not present

## 2017-08-04 DIAGNOSIS — D509 Iron deficiency anemia, unspecified: Secondary | ICD-10-CM | POA: Diagnosis not present

## 2017-08-04 DIAGNOSIS — N186 End stage renal disease: Secondary | ICD-10-CM | POA: Diagnosis not present

## 2017-08-04 DIAGNOSIS — D631 Anemia in chronic kidney disease: Secondary | ICD-10-CM | POA: Diagnosis not present

## 2017-08-04 DIAGNOSIS — N2581 Secondary hyperparathyroidism of renal origin: Secondary | ICD-10-CM | POA: Diagnosis not present

## 2017-08-06 DIAGNOSIS — D509 Iron deficiency anemia, unspecified: Secondary | ICD-10-CM | POA: Diagnosis not present

## 2017-08-06 DIAGNOSIS — N186 End stage renal disease: Secondary | ICD-10-CM | POA: Diagnosis not present

## 2017-08-06 DIAGNOSIS — N2581 Secondary hyperparathyroidism of renal origin: Secondary | ICD-10-CM | POA: Diagnosis not present

## 2017-08-06 DIAGNOSIS — D631 Anemia in chronic kidney disease: Secondary | ICD-10-CM | POA: Diagnosis not present

## 2017-08-06 DIAGNOSIS — E1022 Type 1 diabetes mellitus with diabetic chronic kidney disease: Secondary | ICD-10-CM | POA: Diagnosis not present

## 2017-08-09 DIAGNOSIS — N186 End stage renal disease: Secondary | ICD-10-CM | POA: Diagnosis not present

## 2017-08-09 DIAGNOSIS — E1022 Type 1 diabetes mellitus with diabetic chronic kidney disease: Secondary | ICD-10-CM | POA: Diagnosis not present

## 2017-08-09 DIAGNOSIS — D631 Anemia in chronic kidney disease: Secondary | ICD-10-CM | POA: Diagnosis not present

## 2017-08-09 DIAGNOSIS — D509 Iron deficiency anemia, unspecified: Secondary | ICD-10-CM | POA: Diagnosis not present

## 2017-08-09 DIAGNOSIS — N2581 Secondary hyperparathyroidism of renal origin: Secondary | ICD-10-CM | POA: Diagnosis not present

## 2017-08-11 DIAGNOSIS — D509 Iron deficiency anemia, unspecified: Secondary | ICD-10-CM | POA: Diagnosis not present

## 2017-08-11 DIAGNOSIS — E1022 Type 1 diabetes mellitus with diabetic chronic kidney disease: Secondary | ICD-10-CM | POA: Diagnosis not present

## 2017-08-11 DIAGNOSIS — N186 End stage renal disease: Secondary | ICD-10-CM | POA: Diagnosis not present

## 2017-08-11 DIAGNOSIS — N2581 Secondary hyperparathyroidism of renal origin: Secondary | ICD-10-CM | POA: Diagnosis not present

## 2017-08-11 DIAGNOSIS — D631 Anemia in chronic kidney disease: Secondary | ICD-10-CM | POA: Diagnosis not present

## 2017-08-13 DIAGNOSIS — N2581 Secondary hyperparathyroidism of renal origin: Secondary | ICD-10-CM | POA: Diagnosis not present

## 2017-08-13 DIAGNOSIS — D509 Iron deficiency anemia, unspecified: Secondary | ICD-10-CM | POA: Diagnosis not present

## 2017-08-13 DIAGNOSIS — E1022 Type 1 diabetes mellitus with diabetic chronic kidney disease: Secondary | ICD-10-CM | POA: Diagnosis not present

## 2017-08-13 DIAGNOSIS — N186 End stage renal disease: Secondary | ICD-10-CM | POA: Diagnosis not present

## 2017-08-13 DIAGNOSIS — D631 Anemia in chronic kidney disease: Secondary | ICD-10-CM | POA: Diagnosis not present

## 2017-08-15 DIAGNOSIS — N2581 Secondary hyperparathyroidism of renal origin: Secondary | ICD-10-CM | POA: Diagnosis not present

## 2017-08-15 DIAGNOSIS — D631 Anemia in chronic kidney disease: Secondary | ICD-10-CM | POA: Diagnosis not present

## 2017-08-15 DIAGNOSIS — N186 End stage renal disease: Secondary | ICD-10-CM | POA: Diagnosis not present

## 2017-08-15 DIAGNOSIS — E1022 Type 1 diabetes mellitus with diabetic chronic kidney disease: Secondary | ICD-10-CM | POA: Diagnosis not present

## 2017-08-15 DIAGNOSIS — D509 Iron deficiency anemia, unspecified: Secondary | ICD-10-CM | POA: Diagnosis not present

## 2017-08-17 DIAGNOSIS — N186 End stage renal disease: Secondary | ICD-10-CM | POA: Diagnosis not present

## 2017-08-17 DIAGNOSIS — D631 Anemia in chronic kidney disease: Secondary | ICD-10-CM | POA: Diagnosis not present

## 2017-08-17 DIAGNOSIS — D509 Iron deficiency anemia, unspecified: Secondary | ICD-10-CM | POA: Diagnosis not present

## 2017-08-17 DIAGNOSIS — E1022 Type 1 diabetes mellitus with diabetic chronic kidney disease: Secondary | ICD-10-CM | POA: Diagnosis not present

## 2017-08-17 DIAGNOSIS — N2581 Secondary hyperparathyroidism of renal origin: Secondary | ICD-10-CM | POA: Diagnosis not present

## 2017-08-20 DIAGNOSIS — D631 Anemia in chronic kidney disease: Secondary | ICD-10-CM | POA: Diagnosis not present

## 2017-08-20 DIAGNOSIS — E1022 Type 1 diabetes mellitus with diabetic chronic kidney disease: Secondary | ICD-10-CM | POA: Diagnosis not present

## 2017-08-20 DIAGNOSIS — N186 End stage renal disease: Secondary | ICD-10-CM | POA: Diagnosis not present

## 2017-08-20 DIAGNOSIS — D509 Iron deficiency anemia, unspecified: Secondary | ICD-10-CM | POA: Diagnosis not present

## 2017-08-20 DIAGNOSIS — N2581 Secondary hyperparathyroidism of renal origin: Secondary | ICD-10-CM | POA: Diagnosis not present

## 2017-08-23 DIAGNOSIS — D631 Anemia in chronic kidney disease: Secondary | ICD-10-CM | POA: Diagnosis not present

## 2017-08-23 DIAGNOSIS — E1022 Type 1 diabetes mellitus with diabetic chronic kidney disease: Secondary | ICD-10-CM | POA: Diagnosis not present

## 2017-08-23 DIAGNOSIS — N186 End stage renal disease: Secondary | ICD-10-CM | POA: Diagnosis not present

## 2017-08-23 DIAGNOSIS — N2581 Secondary hyperparathyroidism of renal origin: Secondary | ICD-10-CM | POA: Diagnosis not present

## 2017-08-23 DIAGNOSIS — D509 Iron deficiency anemia, unspecified: Secondary | ICD-10-CM | POA: Diagnosis not present

## 2017-08-25 DIAGNOSIS — N186 End stage renal disease: Secondary | ICD-10-CM | POA: Diagnosis not present

## 2017-08-25 DIAGNOSIS — N2581 Secondary hyperparathyroidism of renal origin: Secondary | ICD-10-CM | POA: Diagnosis not present

## 2017-08-25 DIAGNOSIS — D631 Anemia in chronic kidney disease: Secondary | ICD-10-CM | POA: Diagnosis not present

## 2017-08-25 DIAGNOSIS — E1022 Type 1 diabetes mellitus with diabetic chronic kidney disease: Secondary | ICD-10-CM | POA: Diagnosis not present

## 2017-08-25 DIAGNOSIS — D509 Iron deficiency anemia, unspecified: Secondary | ICD-10-CM | POA: Diagnosis not present

## 2017-08-27 DIAGNOSIS — D631 Anemia in chronic kidney disease: Secondary | ICD-10-CM | POA: Diagnosis not present

## 2017-08-27 DIAGNOSIS — N186 End stage renal disease: Secondary | ICD-10-CM | POA: Diagnosis not present

## 2017-08-27 DIAGNOSIS — E1022 Type 1 diabetes mellitus with diabetic chronic kidney disease: Secondary | ICD-10-CM | POA: Diagnosis not present

## 2017-08-27 DIAGNOSIS — D509 Iron deficiency anemia, unspecified: Secondary | ICD-10-CM | POA: Diagnosis not present

## 2017-08-27 DIAGNOSIS — Z992 Dependence on renal dialysis: Secondary | ICD-10-CM | POA: Diagnosis not present

## 2017-08-27 DIAGNOSIS — N2581 Secondary hyperparathyroidism of renal origin: Secondary | ICD-10-CM | POA: Diagnosis not present

## 2017-08-27 DIAGNOSIS — I129 Hypertensive chronic kidney disease with stage 1 through stage 4 chronic kidney disease, or unspecified chronic kidney disease: Secondary | ICD-10-CM | POA: Diagnosis not present

## 2017-08-30 DIAGNOSIS — D631 Anemia in chronic kidney disease: Secondary | ICD-10-CM | POA: Diagnosis not present

## 2017-08-30 DIAGNOSIS — D509 Iron deficiency anemia, unspecified: Secondary | ICD-10-CM | POA: Diagnosis not present

## 2017-08-30 DIAGNOSIS — N2581 Secondary hyperparathyroidism of renal origin: Secondary | ICD-10-CM | POA: Diagnosis not present

## 2017-08-30 DIAGNOSIS — R739 Hyperglycemia, unspecified: Secondary | ICD-10-CM | POA: Diagnosis not present

## 2017-08-30 DIAGNOSIS — N186 End stage renal disease: Secondary | ICD-10-CM | POA: Diagnosis not present

## 2017-08-30 DIAGNOSIS — E1022 Type 1 diabetes mellitus with diabetic chronic kidney disease: Secondary | ICD-10-CM | POA: Diagnosis not present

## 2017-09-01 DIAGNOSIS — D631 Anemia in chronic kidney disease: Secondary | ICD-10-CM | POA: Diagnosis not present

## 2017-09-01 DIAGNOSIS — E1022 Type 1 diabetes mellitus with diabetic chronic kidney disease: Secondary | ICD-10-CM | POA: Diagnosis not present

## 2017-09-01 DIAGNOSIS — N186 End stage renal disease: Secondary | ICD-10-CM | POA: Diagnosis not present

## 2017-09-01 DIAGNOSIS — D509 Iron deficiency anemia, unspecified: Secondary | ICD-10-CM | POA: Diagnosis not present

## 2017-09-01 DIAGNOSIS — R739 Hyperglycemia, unspecified: Secondary | ICD-10-CM | POA: Diagnosis not present

## 2017-09-01 DIAGNOSIS — N2581 Secondary hyperparathyroidism of renal origin: Secondary | ICD-10-CM | POA: Diagnosis not present

## 2017-09-03 DIAGNOSIS — R739 Hyperglycemia, unspecified: Secondary | ICD-10-CM | POA: Diagnosis not present

## 2017-09-03 DIAGNOSIS — D509 Iron deficiency anemia, unspecified: Secondary | ICD-10-CM | POA: Diagnosis not present

## 2017-09-03 DIAGNOSIS — E1022 Type 1 diabetes mellitus with diabetic chronic kidney disease: Secondary | ICD-10-CM | POA: Diagnosis not present

## 2017-09-03 DIAGNOSIS — D631 Anemia in chronic kidney disease: Secondary | ICD-10-CM | POA: Diagnosis not present

## 2017-09-03 DIAGNOSIS — N186 End stage renal disease: Secondary | ICD-10-CM | POA: Diagnosis not present

## 2017-09-03 DIAGNOSIS — N2581 Secondary hyperparathyroidism of renal origin: Secondary | ICD-10-CM | POA: Diagnosis not present

## 2017-09-06 DIAGNOSIS — D509 Iron deficiency anemia, unspecified: Secondary | ICD-10-CM | POA: Diagnosis not present

## 2017-09-06 DIAGNOSIS — N2581 Secondary hyperparathyroidism of renal origin: Secondary | ICD-10-CM | POA: Diagnosis not present

## 2017-09-06 DIAGNOSIS — D631 Anemia in chronic kidney disease: Secondary | ICD-10-CM | POA: Diagnosis not present

## 2017-09-06 DIAGNOSIS — R739 Hyperglycemia, unspecified: Secondary | ICD-10-CM | POA: Diagnosis not present

## 2017-09-06 DIAGNOSIS — E1022 Type 1 diabetes mellitus with diabetic chronic kidney disease: Secondary | ICD-10-CM | POA: Diagnosis not present

## 2017-09-06 DIAGNOSIS — N186 End stage renal disease: Secondary | ICD-10-CM | POA: Diagnosis not present

## 2017-09-08 DIAGNOSIS — E1022 Type 1 diabetes mellitus with diabetic chronic kidney disease: Secondary | ICD-10-CM | POA: Diagnosis not present

## 2017-09-08 DIAGNOSIS — N186 End stage renal disease: Secondary | ICD-10-CM | POA: Diagnosis not present

## 2017-09-08 DIAGNOSIS — N2581 Secondary hyperparathyroidism of renal origin: Secondary | ICD-10-CM | POA: Diagnosis not present

## 2017-09-08 DIAGNOSIS — D509 Iron deficiency anemia, unspecified: Secondary | ICD-10-CM | POA: Diagnosis not present

## 2017-09-08 DIAGNOSIS — D631 Anemia in chronic kidney disease: Secondary | ICD-10-CM | POA: Diagnosis not present

## 2017-09-08 DIAGNOSIS — R739 Hyperglycemia, unspecified: Secondary | ICD-10-CM | POA: Diagnosis not present

## 2017-09-09 ENCOUNTER — Ambulatory Visit (INDEPENDENT_AMBULATORY_CARE_PROVIDER_SITE_OTHER): Payer: Medicare Other

## 2017-09-09 ENCOUNTER — Encounter: Payer: Self-pay | Admitting: Podiatry

## 2017-09-09 ENCOUNTER — Ambulatory Visit (INDEPENDENT_AMBULATORY_CARE_PROVIDER_SITE_OTHER): Payer: Medicare Other | Admitting: Podiatry

## 2017-09-09 DIAGNOSIS — L97529 Non-pressure chronic ulcer of other part of left foot with unspecified severity: Secondary | ICD-10-CM | POA: Diagnosis not present

## 2017-09-09 DIAGNOSIS — L97519 Non-pressure chronic ulcer of other part of right foot with unspecified severity: Secondary | ICD-10-CM | POA: Diagnosis not present

## 2017-09-09 DIAGNOSIS — E08621 Diabetes mellitus due to underlying condition with foot ulcer: Secondary | ICD-10-CM

## 2017-09-09 NOTE — Progress Notes (Signed)
Subjective:    Patient ID: [name], male    DOB: [birth year], 36 y.o.   MRN: [id number]  HPI  Chief Complaint  Patient presents with  . Diabetes    diabetic foot exam  . Peripheral Neuropathy    numbness and tingling in lower legs and feet bilaterally  . Callouses    preulcerative diabetic callouses bilateral   36 y.o. male presents with the above complaint.  Reports painful callus areas of the outside of both feet.  Reports diabetes type 1 with end-stage renal disease on dialysis  Past Medical History:  Diagnosis Date  . Diabetes mellitus without complication ([place])   . Diabetic gastroparesis ([place])   . Dialysis patient ([place])   . Hypertension   . Renal disorder    Dialysis  . Sepsis [facility])    Past Surgical History:  Procedure Laterality Date  . AV FISTULA PLACEMENT     left arm.  . AV FISTULA PLACEMENT Right 12/22/2016   Procedure: INSERTION OF ARTERIOVENOUS (AV) GORE-TEX GRAFT ARM;  Surgeon: Elam Dutch, MD;  Location: [place];  Service: Vascular;  Laterality: Right;  . EYE SURGERY    . I&D EXTREMITY Right 10/31/2017   Procedure: IRRIGATION AND DEBRIDEMENT RIGHT FOOT;  Surgeon: Evelina Bucy, DPM;  Location: [place];  Service: Podiatry;  Laterality: Right;  . INSERTION OF DIALYSIS CATHETER     Right subclavian  . UPPER EXTREMITY VENOGRAPHY N/A 11/16/2016   Procedure: Upper Extremity Venography - Right Central;  Surgeon: Elam Dutch, MD;  Location: [place] [facility];  Service: Cardiovascular;  Laterality: N/A;    Current Outpatient Medications:  .  acetaminophen (TYLENOL) 325 MG tablet, Take 2 tablets (650 mg total) by mouth every 6 (six) hours as needed for mild pain or headache (pain/headache). Can restart this after you are done with Oxycodone/Tylenol, Disp: , Rfl:  .  amLODipine (NORVASC) 10 MG tablet, Take 10 mg by mouth daily., Disp: , Rfl:  .  ceFAZolin (ANCEF) IVPB, Inject 2 g into the vein every Monday, Wednesday, and Friday with hemodialysis. For 3  weeks, Disp: 10 Units, Rfl: 0 .  cinacalcet (SENSIPAR) 30 MG tablet, Take 30 mg by mouth daily with supper., Disp: , Rfl:  .  clonazePAM (KLONOPIN) 0.5 MG tablet, Take 0.5 mg by mouth daily as needed for anxiety (sleep). , Disp: , Rfl:  .  insulin aspart (NOVOLOG) 100 UNIT/ML injection, Inject 5 Units into the skin 2 (two) times daily with a meal. , Disp: , Rfl:  .  insulin glargine (LANTUS) 100 UNIT/ML injection, Inject 0.2 mLs (20 Units total) into the skin at bedtime., Disp: , Rfl:  .  losartan (COZAAR) 50 MG tablet, Take 50 mg by mouth daily., Disp: , Rfl:  .  oxyCODONE-acetaminophen (PERCOCET/ROXICET) 5-325 MG tablet, Take 1-2 tablets by mouth every 6 (six) hours as needed., Disp: 30 tablet, Rfl: 0 .  sevelamer carbonate (RENVELA) 800 MG tablet, Take 2,400-3,200 mg by mouth See admin instructions. Take 3 - 4 tablets (2400 mg -3200 mg) by mouth two times daily with meals, Disp: , Rfl:   Allergies  Allergen Reactions  . No Known Allergies     Review of Systems  All other systems reviewed and are negative.     Objective:   Physical Exam There were no vitals filed for this visit. General AA&O x3. Normal mood and affect.  Vascular Dorsalis pedis and posterior tibial pulses  present 1+ and absent bilaterally  Capillary  refill normal to all digits. Pedal hair growth absent.  Neurologic Epicritic sensation grossly absent. Protective sensation absent  Dermatologic (Wound) Wound Location: R 5th MPJ Wound Measurement: 2.0x2.0 post-debridement. Wound Base: Granular/Healthy Peri-wound: Calloused Exudate: None: wound tissue dry  L 5th MPJ without open ulceration but with significant hyperkeratotic tissue.  Orthopedic: MMT 5/5 in dorsiflexion, plantarflexion, inversion, and eversion. Normal lower extremity joint ROM without pain or crepitus.   Radiographs: Taken and reviewed. No osseous erosions present. No acute fractures or dislocations.    Assessment & Plan:  Patient was evaluated  and treated all questions answered  Ulcer R 1st MPJ -XR reviewed. No osseous erosions noted. -Debrided as below. -Dressed with silvadene, DSD  Procedure: Excisional Debridement of Wound Rationale: Removal of non-viable soft tissue from the wound to promote healing.  Anesthesia: none Pre-Debridement Wound Measurements: hyperkeratotic cover Post-Debridement Wound Measurements: 2 cm x 2 cm x 0.1 cm  Type of Debridement: Excisional Tissue Removed: Non-viable soft tissue Depth of Debridement: subq Instrumentation: 312 blade Technique: Sharp excisional debridement to bleeding, viable wound base.  Dressing: Dry, sterile, compression dressing. Disposition: Patient tolerated procedure well. Patient to return in 1 week for follow-up.  Pre-ulcerative lesion left first MPJ -Debrided without open ulceration noted lesion  F/u in 2 weeks for recheck

## 2017-09-10 DIAGNOSIS — D509 Iron deficiency anemia, unspecified: Secondary | ICD-10-CM | POA: Diagnosis not present

## 2017-09-10 DIAGNOSIS — E1022 Type 1 diabetes mellitus with diabetic chronic kidney disease: Secondary | ICD-10-CM | POA: Diagnosis not present

## 2017-09-10 DIAGNOSIS — D631 Anemia in chronic kidney disease: Secondary | ICD-10-CM | POA: Diagnosis not present

## 2017-09-10 DIAGNOSIS — N2581 Secondary hyperparathyroidism of renal origin: Secondary | ICD-10-CM | POA: Diagnosis not present

## 2017-09-10 DIAGNOSIS — N186 End stage renal disease: Secondary | ICD-10-CM | POA: Diagnosis not present

## 2017-09-10 DIAGNOSIS — R739 Hyperglycemia, unspecified: Secondary | ICD-10-CM | POA: Diagnosis not present

## 2017-09-13 DIAGNOSIS — D509 Iron deficiency anemia, unspecified: Secondary | ICD-10-CM | POA: Diagnosis not present

## 2017-09-13 DIAGNOSIS — N2581 Secondary hyperparathyroidism of renal origin: Secondary | ICD-10-CM | POA: Diagnosis not present

## 2017-09-13 DIAGNOSIS — D631 Anemia in chronic kidney disease: Secondary | ICD-10-CM | POA: Diagnosis not present

## 2017-09-13 DIAGNOSIS — E1022 Type 1 diabetes mellitus with diabetic chronic kidney disease: Secondary | ICD-10-CM | POA: Diagnosis not present

## 2017-09-13 DIAGNOSIS — N186 End stage renal disease: Secondary | ICD-10-CM | POA: Diagnosis not present

## 2017-09-13 DIAGNOSIS — R739 Hyperglycemia, unspecified: Secondary | ICD-10-CM | POA: Diagnosis not present

## 2017-09-15 DIAGNOSIS — N186 End stage renal disease: Secondary | ICD-10-CM | POA: Diagnosis not present

## 2017-09-15 DIAGNOSIS — D631 Anemia in chronic kidney disease: Secondary | ICD-10-CM | POA: Diagnosis not present

## 2017-09-15 DIAGNOSIS — N2581 Secondary hyperparathyroidism of renal origin: Secondary | ICD-10-CM | POA: Diagnosis not present

## 2017-09-15 DIAGNOSIS — E1022 Type 1 diabetes mellitus with diabetic chronic kidney disease: Secondary | ICD-10-CM | POA: Diagnosis not present

## 2017-09-15 DIAGNOSIS — R739 Hyperglycemia, unspecified: Secondary | ICD-10-CM | POA: Diagnosis not present

## 2017-09-15 DIAGNOSIS — D509 Iron deficiency anemia, unspecified: Secondary | ICD-10-CM | POA: Diagnosis not present

## 2017-09-17 DIAGNOSIS — N186 End stage renal disease: Secondary | ICD-10-CM | POA: Diagnosis not present

## 2017-09-17 DIAGNOSIS — N2581 Secondary hyperparathyroidism of renal origin: Secondary | ICD-10-CM | POA: Diagnosis not present

## 2017-09-17 DIAGNOSIS — R739 Hyperglycemia, unspecified: Secondary | ICD-10-CM | POA: Diagnosis not present

## 2017-09-17 DIAGNOSIS — D631 Anemia in chronic kidney disease: Secondary | ICD-10-CM | POA: Diagnosis not present

## 2017-09-17 DIAGNOSIS — D509 Iron deficiency anemia, unspecified: Secondary | ICD-10-CM | POA: Diagnosis not present

## 2017-09-17 DIAGNOSIS — E1022 Type 1 diabetes mellitus with diabetic chronic kidney disease: Secondary | ICD-10-CM | POA: Diagnosis not present

## 2017-09-19 DIAGNOSIS — R739 Hyperglycemia, unspecified: Secondary | ICD-10-CM | POA: Diagnosis not present

## 2017-09-19 DIAGNOSIS — N2581 Secondary hyperparathyroidism of renal origin: Secondary | ICD-10-CM | POA: Diagnosis not present

## 2017-09-19 DIAGNOSIS — N186 End stage renal disease: Secondary | ICD-10-CM | POA: Diagnosis not present

## 2017-09-19 DIAGNOSIS — D509 Iron deficiency anemia, unspecified: Secondary | ICD-10-CM | POA: Diagnosis not present

## 2017-09-19 DIAGNOSIS — E1022 Type 1 diabetes mellitus with diabetic chronic kidney disease: Secondary | ICD-10-CM | POA: Diagnosis not present

## 2017-09-19 DIAGNOSIS — D631 Anemia in chronic kidney disease: Secondary | ICD-10-CM | POA: Diagnosis not present

## 2017-09-22 DIAGNOSIS — D509 Iron deficiency anemia, unspecified: Secondary | ICD-10-CM | POA: Diagnosis not present

## 2017-09-22 DIAGNOSIS — N186 End stage renal disease: Secondary | ICD-10-CM | POA: Diagnosis not present

## 2017-09-22 DIAGNOSIS — E1022 Type 1 diabetes mellitus with diabetic chronic kidney disease: Secondary | ICD-10-CM | POA: Diagnosis not present

## 2017-09-22 DIAGNOSIS — D631 Anemia in chronic kidney disease: Secondary | ICD-10-CM | POA: Diagnosis not present

## 2017-09-22 DIAGNOSIS — N2581 Secondary hyperparathyroidism of renal origin: Secondary | ICD-10-CM | POA: Diagnosis not present

## 2017-09-22 DIAGNOSIS — R739 Hyperglycemia, unspecified: Secondary | ICD-10-CM | POA: Diagnosis not present

## 2017-09-24 DIAGNOSIS — R739 Hyperglycemia, unspecified: Secondary | ICD-10-CM | POA: Diagnosis not present

## 2017-09-24 DIAGNOSIS — E1022 Type 1 diabetes mellitus with diabetic chronic kidney disease: Secondary | ICD-10-CM | POA: Diagnosis not present

## 2017-09-24 DIAGNOSIS — N2581 Secondary hyperparathyroidism of renal origin: Secondary | ICD-10-CM | POA: Diagnosis not present

## 2017-09-24 DIAGNOSIS — D509 Iron deficiency anemia, unspecified: Secondary | ICD-10-CM | POA: Diagnosis not present

## 2017-09-24 DIAGNOSIS — D631 Anemia in chronic kidney disease: Secondary | ICD-10-CM | POA: Diagnosis not present

## 2017-09-24 DIAGNOSIS — N186 End stage renal disease: Secondary | ICD-10-CM | POA: Diagnosis not present

## 2017-09-26 DIAGNOSIS — D509 Iron deficiency anemia, unspecified: Secondary | ICD-10-CM | POA: Diagnosis not present

## 2017-09-26 DIAGNOSIS — E1022 Type 1 diabetes mellitus with diabetic chronic kidney disease: Secondary | ICD-10-CM | POA: Diagnosis not present

## 2017-09-26 DIAGNOSIS — N186 End stage renal disease: Secondary | ICD-10-CM | POA: Diagnosis not present

## 2017-09-26 DIAGNOSIS — D631 Anemia in chronic kidney disease: Secondary | ICD-10-CM | POA: Diagnosis not present

## 2017-09-26 DIAGNOSIS — R739 Hyperglycemia, unspecified: Secondary | ICD-10-CM | POA: Diagnosis not present

## 2017-09-26 DIAGNOSIS — N2581 Secondary hyperparathyroidism of renal origin: Secondary | ICD-10-CM | POA: Diagnosis not present

## 2017-09-27 DIAGNOSIS — I129 Hypertensive chronic kidney disease with stage 1 through stage 4 chronic kidney disease, or unspecified chronic kidney disease: Secondary | ICD-10-CM | POA: Diagnosis not present

## 2017-09-27 DIAGNOSIS — N186 End stage renal disease: Secondary | ICD-10-CM | POA: Diagnosis not present

## 2017-09-27 DIAGNOSIS — Z992 Dependence on renal dialysis: Secondary | ICD-10-CM | POA: Diagnosis not present

## 2017-09-29 DIAGNOSIS — N2581 Secondary hyperparathyroidism of renal origin: Secondary | ICD-10-CM | POA: Diagnosis not present

## 2017-09-29 DIAGNOSIS — D509 Iron deficiency anemia, unspecified: Secondary | ICD-10-CM | POA: Diagnosis not present

## 2017-09-29 DIAGNOSIS — S91301A Unspecified open wound, right foot, initial encounter: Secondary | ICD-10-CM | POA: Diagnosis not present

## 2017-09-29 DIAGNOSIS — E1022 Type 1 diabetes mellitus with diabetic chronic kidney disease: Secondary | ICD-10-CM | POA: Diagnosis not present

## 2017-09-29 DIAGNOSIS — D631 Anemia in chronic kidney disease: Secondary | ICD-10-CM | POA: Diagnosis not present

## 2017-09-29 DIAGNOSIS — N186 End stage renal disease: Secondary | ICD-10-CM | POA: Diagnosis not present

## 2017-09-29 DIAGNOSIS — R739 Hyperglycemia, unspecified: Secondary | ICD-10-CM | POA: Diagnosis not present

## 2017-09-30 ENCOUNTER — Ambulatory Visit (INDEPENDENT_AMBULATORY_CARE_PROVIDER_SITE_OTHER): Payer: Medicare Other | Admitting: Podiatry

## 2017-09-30 DIAGNOSIS — L97519 Non-pressure chronic ulcer of other part of right foot with unspecified severity: Secondary | ICD-10-CM

## 2017-09-30 DIAGNOSIS — E11621 Type 2 diabetes mellitus with foot ulcer: Secondary | ICD-10-CM

## 2017-10-01 ENCOUNTER — Telehealth: Payer: Self-pay | Admitting: *Deleted

## 2017-10-01 DIAGNOSIS — N2581 Secondary hyperparathyroidism of renal origin: Secondary | ICD-10-CM | POA: Diagnosis not present

## 2017-10-01 DIAGNOSIS — E1022 Type 1 diabetes mellitus with diabetic chronic kidney disease: Secondary | ICD-10-CM | POA: Diagnosis not present

## 2017-10-01 DIAGNOSIS — N186 End stage renal disease: Secondary | ICD-10-CM | POA: Diagnosis not present

## 2017-10-01 DIAGNOSIS — R739 Hyperglycemia, unspecified: Secondary | ICD-10-CM | POA: Diagnosis not present

## 2017-10-01 DIAGNOSIS — D509 Iron deficiency anemia, unspecified: Secondary | ICD-10-CM | POA: Diagnosis not present

## 2017-10-01 DIAGNOSIS — S91301A Unspecified open wound, right foot, initial encounter: Secondary | ICD-10-CM | POA: Diagnosis not present

## 2017-10-01 NOTE — Telephone Encounter (Signed)
Dr. March Rummage ordered Collagen with Silver, conforming gauze, 4x4 gauze and tape x 2 rolls for daily dressing changes of diabetic ulcer to right 5th Metatarsal L97.519 3.0 x 3.0 x 0.1cm without exudate, orders faxed to [facility].

## 2017-10-04 DIAGNOSIS — N2581 Secondary hyperparathyroidism of renal origin: Secondary | ICD-10-CM | POA: Diagnosis not present

## 2017-10-04 DIAGNOSIS — N186 End stage renal disease: Secondary | ICD-10-CM | POA: Diagnosis not present

## 2017-10-04 DIAGNOSIS — D509 Iron deficiency anemia, unspecified: Secondary | ICD-10-CM | POA: Diagnosis not present

## 2017-10-04 DIAGNOSIS — S91301A Unspecified open wound, right foot, initial encounter: Secondary | ICD-10-CM | POA: Diagnosis not present

## 2017-10-04 DIAGNOSIS — E1022 Type 1 diabetes mellitus with diabetic chronic kidney disease: Secondary | ICD-10-CM | POA: Diagnosis not present

## 2017-10-04 DIAGNOSIS — R739 Hyperglycemia, unspecified: Secondary | ICD-10-CM | POA: Diagnosis not present

## 2017-10-05 DIAGNOSIS — H25043 Posterior subcapsular polar age-related cataract, bilateral: Secondary | ICD-10-CM | POA: Diagnosis not present

## 2017-10-05 DIAGNOSIS — E113593 Type 2 diabetes mellitus with proliferative diabetic retinopathy without macular edema, bilateral: Secondary | ICD-10-CM | POA: Diagnosis not present

## 2017-10-05 DIAGNOSIS — H3582 Retinal ischemia: Secondary | ICD-10-CM | POA: Diagnosis not present

## 2017-10-05 DIAGNOSIS — H35372 Puckering of macula, left eye: Secondary | ICD-10-CM | POA: Diagnosis not present

## 2017-10-06 DIAGNOSIS — E1022 Type 1 diabetes mellitus with diabetic chronic kidney disease: Secondary | ICD-10-CM | POA: Diagnosis not present

## 2017-10-06 DIAGNOSIS — S91301A Unspecified open wound, right foot, initial encounter: Secondary | ICD-10-CM | POA: Diagnosis not present

## 2017-10-06 DIAGNOSIS — N186 End stage renal disease: Secondary | ICD-10-CM | POA: Diagnosis not present

## 2017-10-06 DIAGNOSIS — D509 Iron deficiency anemia, unspecified: Secondary | ICD-10-CM | POA: Diagnosis not present

## 2017-10-06 DIAGNOSIS — E1129 Type 2 diabetes mellitus with other diabetic kidney complication: Secondary | ICD-10-CM | POA: Diagnosis not present

## 2017-10-06 DIAGNOSIS — N2581 Secondary hyperparathyroidism of renal origin: Secondary | ICD-10-CM | POA: Diagnosis not present

## 2017-10-06 DIAGNOSIS — R739 Hyperglycemia, unspecified: Secondary | ICD-10-CM | POA: Diagnosis not present

## 2017-10-08 DIAGNOSIS — N186 End stage renal disease: Secondary | ICD-10-CM | POA: Diagnosis not present

## 2017-10-08 DIAGNOSIS — D509 Iron deficiency anemia, unspecified: Secondary | ICD-10-CM | POA: Diagnosis not present

## 2017-10-08 DIAGNOSIS — N2581 Secondary hyperparathyroidism of renal origin: Secondary | ICD-10-CM | POA: Diagnosis not present

## 2017-10-08 DIAGNOSIS — E1022 Type 1 diabetes mellitus with diabetic chronic kidney disease: Secondary | ICD-10-CM | POA: Diagnosis not present

## 2017-10-08 DIAGNOSIS — R739 Hyperglycemia, unspecified: Secondary | ICD-10-CM | POA: Diagnosis not present

## 2017-10-08 DIAGNOSIS — S91301A Unspecified open wound, right foot, initial encounter: Secondary | ICD-10-CM | POA: Diagnosis not present

## 2017-10-11 DIAGNOSIS — N2581 Secondary hyperparathyroidism of renal origin: Secondary | ICD-10-CM | POA: Diagnosis not present

## 2017-10-11 DIAGNOSIS — E1022 Type 1 diabetes mellitus with diabetic chronic kidney disease: Secondary | ICD-10-CM | POA: Diagnosis not present

## 2017-10-11 DIAGNOSIS — R739 Hyperglycemia, unspecified: Secondary | ICD-10-CM | POA: Diagnosis not present

## 2017-10-11 DIAGNOSIS — N186 End stage renal disease: Secondary | ICD-10-CM | POA: Diagnosis not present

## 2017-10-11 DIAGNOSIS — D509 Iron deficiency anemia, unspecified: Secondary | ICD-10-CM | POA: Diagnosis not present

## 2017-10-11 DIAGNOSIS — S91301A Unspecified open wound, right foot, initial encounter: Secondary | ICD-10-CM | POA: Diagnosis not present

## 2017-10-13 DIAGNOSIS — N2581 Secondary hyperparathyroidism of renal origin: Secondary | ICD-10-CM | POA: Diagnosis not present

## 2017-10-13 DIAGNOSIS — N186 End stage renal disease: Secondary | ICD-10-CM | POA: Diagnosis not present

## 2017-10-13 DIAGNOSIS — E1022 Type 1 diabetes mellitus with diabetic chronic kidney disease: Secondary | ICD-10-CM | POA: Diagnosis not present

## 2017-10-13 DIAGNOSIS — D509 Iron deficiency anemia, unspecified: Secondary | ICD-10-CM | POA: Diagnosis not present

## 2017-10-13 DIAGNOSIS — R739 Hyperglycemia, unspecified: Secondary | ICD-10-CM | POA: Diagnosis not present

## 2017-10-13 DIAGNOSIS — S91301A Unspecified open wound, right foot, initial encounter: Secondary | ICD-10-CM | POA: Diagnosis not present

## 2017-10-14 ENCOUNTER — Ambulatory Visit (INDEPENDENT_AMBULATORY_CARE_PROVIDER_SITE_OTHER): Payer: Medicare Other | Admitting: Podiatry

## 2017-10-14 DIAGNOSIS — L97511 Non-pressure chronic ulcer of other part of right foot limited to breakdown of skin: Secondary | ICD-10-CM

## 2017-10-14 DIAGNOSIS — E08621 Diabetes mellitus due to underlying condition with foot ulcer: Secondary | ICD-10-CM

## 2017-10-14 DIAGNOSIS — L97529 Non-pressure chronic ulcer of other part of left foot with unspecified severity: Secondary | ICD-10-CM

## 2017-10-14 DIAGNOSIS — L97519 Non-pressure chronic ulcer of other part of right foot with unspecified severity: Secondary | ICD-10-CM

## 2017-10-14 NOTE — Progress Notes (Signed)
  Subjective:  Patient ID: [name], male    DOB: Apr 03, 1981,  MRN: [id number]  Chief Complaint  Patient presents with  . Foot Ulcer    2 week follow up bilateral diabetic foot ulcers   37 y.o. male returns for wound care. Believes the wound to be improving. Never got the wound care supplies. Denies N/V/F/Ch.  Objective:  There were no vitals filed for this visit. General AA&O x3. Normal mood and affect.  Vascular Foot warm to touch.  Neurologic Sensation grossly diminished.  Dermatologic (Wound) Wound Location: Rt. foot sub 5th MPJ Wound Measurement: 1 x 1.5 Wound Base: Granular/Healthy Peri-wound: Calloused Exudate: None: wound tissue dry  Wound progress: Improved since last check.  L 5th MPJ hyperkeratosis.  Orthopedic: No pain to palpation either foot.   Assessment & Plan:  Patient was evaluated and treated and all questions answered.  Ulcer Bilat feet -Debridement R foot wound as below. -Dressed with medihoney, DSD.  Procedure: Excisional Debridement of Wound Rationale: Removal of non-viable soft tissue from the wound to promote healing.  Anesthesia: none Pre-Debridement Wound Measurements: 1 cm x 1 cm x 0.1 cm  Post-Debridement Wound Measurements: 1 cm x 1.5 cm x 0.1 cm  Type of Debridement: Excisional Tissue Removed: Non-viable soft tissue Depth of Debridement: subcutaneous tissue. Instrumentation: 3-0 mm dermal curette Technique: Sharp excisional debridement to bleeding, viable wound base.  Dressing: Dry, sterile, compression dressing. Disposition: Patient tolerated procedure well. Patient to return in 1 week for follow-up.  Return in about 2 weeks (around 10/28/2017).

## 2017-10-14 NOTE — Progress Notes (Signed)
  Subjective:  Patient ID: [name], male    DOB: September 01, 1981,  MRN: [id number]  Chief Complaint  Patient presents with  . Wound Check    2 week follow up   37 y.o. male returns for wound care. Believes the wound to be improving. Upset that he did not get wound care supplies at last visit. Denies N/V/F/Ch.  Objective:  There were no vitals filed for this visit. General AA&O x3. Normal mood and affect.  Vascular Foot warm to touch.  Neurologic Sensation grossly diminished.  Dermatologic (Wound) Wound Location: Lt. foot fifth MPJ Wound Measurement: 1.5x1.5 x0.1 Wound Base: Granular/Healthy Peri-wound: Calloused Exudate: None: wound tissue dry  Wound progress: Improved since last check.  Orthopedic: No pain to palpation either foot.   Assessment & Plan:  Patient was evaluated and treated and all questions answered.  Ulcer left fifth MPJ -Debridement as below. -Dressed with Silvadene, DSD. -Wound care supplies ordered.  Procedure: Excisional Debridement of Wound Rationale: Removal of non-viable soft tissue from the wound to promote healing.  Anesthesia: None Pre-Debridement Wound Measurements: 1.5 cm x 1.5 cm x 0.1 cm  Post-Debridement Wound Measurements: 2x2x0.1 Type of Debridement: Excisional Tissue Removed: Non-viable soft tissue Depth of Debridement: Subcutaneous tissue Instrumentation: 312 blade, tissue nipper technique: Sharp excisional debridement to bleeding, viable wound base.  Dressing: Dry, sterile, compression dressing. Disposition: Patient tolerated procedure well. Patient to return in 1 week for follow-up.  Return in about 2 weeks (around 10/14/2017) for Wound Care.

## 2017-10-15 DIAGNOSIS — R739 Hyperglycemia, unspecified: Secondary | ICD-10-CM | POA: Diagnosis not present

## 2017-10-15 DIAGNOSIS — N186 End stage renal disease: Secondary | ICD-10-CM | POA: Diagnosis not present

## 2017-10-15 DIAGNOSIS — N2581 Secondary hyperparathyroidism of renal origin: Secondary | ICD-10-CM | POA: Diagnosis not present

## 2017-10-15 DIAGNOSIS — D509 Iron deficiency anemia, unspecified: Secondary | ICD-10-CM | POA: Diagnosis not present

## 2017-10-15 DIAGNOSIS — S91301A Unspecified open wound, right foot, initial encounter: Secondary | ICD-10-CM | POA: Diagnosis not present

## 2017-10-15 DIAGNOSIS — E1022 Type 1 diabetes mellitus with diabetic chronic kidney disease: Secondary | ICD-10-CM | POA: Diagnosis not present

## 2017-10-18 DIAGNOSIS — R739 Hyperglycemia, unspecified: Secondary | ICD-10-CM | POA: Diagnosis not present

## 2017-10-18 DIAGNOSIS — D509 Iron deficiency anemia, unspecified: Secondary | ICD-10-CM | POA: Diagnosis not present

## 2017-10-18 DIAGNOSIS — S91301A Unspecified open wound, right foot, initial encounter: Secondary | ICD-10-CM | POA: Diagnosis not present

## 2017-10-18 DIAGNOSIS — N186 End stage renal disease: Secondary | ICD-10-CM | POA: Diagnosis not present

## 2017-10-18 DIAGNOSIS — N2581 Secondary hyperparathyroidism of renal origin: Secondary | ICD-10-CM | POA: Diagnosis not present

## 2017-10-18 DIAGNOSIS — E1022 Type 1 diabetes mellitus with diabetic chronic kidney disease: Secondary | ICD-10-CM | POA: Diagnosis not present

## 2017-10-20 DIAGNOSIS — D509 Iron deficiency anemia, unspecified: Secondary | ICD-10-CM | POA: Diagnosis not present

## 2017-10-20 DIAGNOSIS — R739 Hyperglycemia, unspecified: Secondary | ICD-10-CM | POA: Diagnosis not present

## 2017-10-20 DIAGNOSIS — S91301A Unspecified open wound, right foot, initial encounter: Secondary | ICD-10-CM | POA: Diagnosis not present

## 2017-10-20 DIAGNOSIS — E1022 Type 1 diabetes mellitus with diabetic chronic kidney disease: Secondary | ICD-10-CM | POA: Diagnosis not present

## 2017-10-20 DIAGNOSIS — N2581 Secondary hyperparathyroidism of renal origin: Secondary | ICD-10-CM | POA: Diagnosis not present

## 2017-10-20 DIAGNOSIS — N186 End stage renal disease: Secondary | ICD-10-CM | POA: Diagnosis not present

## 2017-10-22 DIAGNOSIS — N2581 Secondary hyperparathyroidism of renal origin: Secondary | ICD-10-CM | POA: Diagnosis not present

## 2017-10-22 DIAGNOSIS — E1022 Type 1 diabetes mellitus with diabetic chronic kidney disease: Secondary | ICD-10-CM | POA: Diagnosis not present

## 2017-10-22 DIAGNOSIS — R739 Hyperglycemia, unspecified: Secondary | ICD-10-CM | POA: Diagnosis not present

## 2017-10-22 DIAGNOSIS — N186 End stage renal disease: Secondary | ICD-10-CM | POA: Diagnosis not present

## 2017-10-22 DIAGNOSIS — D509 Iron deficiency anemia, unspecified: Secondary | ICD-10-CM | POA: Diagnosis not present

## 2017-10-22 DIAGNOSIS — S91301A Unspecified open wound, right foot, initial encounter: Secondary | ICD-10-CM | POA: Diagnosis not present

## 2017-10-25 ENCOUNTER — Ambulatory Visit (INDEPENDENT_AMBULATORY_CARE_PROVIDER_SITE_OTHER): Payer: Medicare Other | Admitting: Podiatry

## 2017-10-25 ENCOUNTER — Emergency Department (HOSPITAL_COMMUNITY): Payer: Medicare Other

## 2017-10-25 ENCOUNTER — Encounter (HOSPITAL_COMMUNITY): Payer: Self-pay

## 2017-10-25 ENCOUNTER — Other Ambulatory Visit: Payer: Self-pay | Admitting: Podiatry

## 2017-10-25 ENCOUNTER — Inpatient Hospital Stay (HOSPITAL_COMMUNITY)
Admission: EM | Admit: 2017-10-25 | Discharge: 2017-11-03 | DRG: 853 | Disposition: A | Discharge status: Home / Self Care | Payer: Medicare Other | Attending: Internal Medicine | Admitting: Internal Medicine

## 2017-10-25 ENCOUNTER — Encounter: Payer: Self-pay | Admitting: Podiatry

## 2017-10-25 ENCOUNTER — Ambulatory Visit (INDEPENDENT_AMBULATORY_CARE_PROVIDER_SITE_OTHER): Payer: Medicare Other

## 2017-10-25 VITALS — BP 143/79 | HR 69 | Temp 103.4°F

## 2017-10-25 DIAGNOSIS — N186 End stage renal disease: Secondary | ICD-10-CM | POA: Diagnosis not present

## 2017-10-25 DIAGNOSIS — L97511 Non-pressure chronic ulcer of other part of right foot limited to breakdown of skin: Secondary | ICD-10-CM | POA: Diagnosis not present

## 2017-10-25 DIAGNOSIS — L97519 Non-pressure chronic ulcer of other part of right foot with unspecified severity: Secondary | ICD-10-CM | POA: Diagnosis present

## 2017-10-25 DIAGNOSIS — K3184 Gastroparesis: Secondary | ICD-10-CM | POA: Diagnosis present

## 2017-10-25 DIAGNOSIS — E875 Hyperkalemia: Secondary | ICD-10-CM | POA: Diagnosis not present

## 2017-10-25 DIAGNOSIS — E1122 Type 2 diabetes mellitus with diabetic chronic kidney disease: Secondary | ICD-10-CM | POA: Diagnosis not present

## 2017-10-25 DIAGNOSIS — L0390 Cellulitis, unspecified: Secondary | ICD-10-CM | POA: Diagnosis not present

## 2017-10-25 DIAGNOSIS — R410 Disorientation, unspecified: Secondary | ICD-10-CM

## 2017-10-25 DIAGNOSIS — L02612 Cutaneous abscess of left foot: Secondary | ICD-10-CM

## 2017-10-25 DIAGNOSIS — L97512 Non-pressure chronic ulcer of other part of right foot with fat layer exposed: Secondary | ICD-10-CM

## 2017-10-25 DIAGNOSIS — E109 Type 1 diabetes mellitus without complications: Secondary | ICD-10-CM | POA: Diagnosis present

## 2017-10-25 DIAGNOSIS — E10628 Type 1 diabetes mellitus with other skin complications: Secondary | ICD-10-CM | POA: Diagnosis present

## 2017-10-25 DIAGNOSIS — Z008 Encounter for other general examination: Secondary | ICD-10-CM | POA: Diagnosis not present

## 2017-10-25 DIAGNOSIS — Z794 Long term (current) use of insulin: Secondary | ICD-10-CM

## 2017-10-25 DIAGNOSIS — Z992 Dependence on renal dialysis: Secondary | ICD-10-CM

## 2017-10-25 DIAGNOSIS — N2581 Secondary hyperparathyroidism of renal origin: Secondary | ICD-10-CM | POA: Diagnosis present

## 2017-10-25 DIAGNOSIS — E1051 Type 1 diabetes mellitus with diabetic peripheral angiopathy without gangrene: Secondary | ICD-10-CM | POA: Diagnosis present

## 2017-10-25 DIAGNOSIS — D631 Anemia in chronic kidney disease: Secondary | ICD-10-CM | POA: Diagnosis present

## 2017-10-25 DIAGNOSIS — I120 Hypertensive chronic kidney disease with stage 5 chronic kidney disease or end stage renal disease: Secondary | ICD-10-CM | POA: Diagnosis present

## 2017-10-25 DIAGNOSIS — E1069 Type 1 diabetes mellitus with other specified complication: Secondary | ICD-10-CM | POA: Diagnosis not present

## 2017-10-25 DIAGNOSIS — L97311 Non-pressure chronic ulcer of right ankle limited to breakdown of skin: Secondary | ICD-10-CM

## 2017-10-25 DIAGNOSIS — I10 Essential (primary) hypertension: Secondary | ICD-10-CM | POA: Diagnosis present

## 2017-10-25 DIAGNOSIS — R4182 Altered mental status, unspecified: Secondary | ICD-10-CM | POA: Diagnosis not present

## 2017-10-25 DIAGNOSIS — E1029 Type 1 diabetes mellitus with other diabetic kidney complication: Secondary | ICD-10-CM | POA: Diagnosis not present

## 2017-10-25 DIAGNOSIS — E11628 Type 2 diabetes mellitus with other skin complications: Secondary | ICD-10-CM | POA: Diagnosis not present

## 2017-10-25 DIAGNOSIS — B9561 Methicillin susceptible Staphylococcus aureus infection as the cause of diseases classified elsewhere: Secondary | ICD-10-CM | POA: Diagnosis present

## 2017-10-25 DIAGNOSIS — R509 Fever, unspecified: Secondary | ICD-10-CM | POA: Diagnosis not present

## 2017-10-25 DIAGNOSIS — E11621 Type 2 diabetes mellitus with foot ulcer: Secondary | ICD-10-CM | POA: Diagnosis not present

## 2017-10-25 DIAGNOSIS — D509 Iron deficiency anemia, unspecified: Secondary | ICD-10-CM | POA: Diagnosis not present

## 2017-10-25 DIAGNOSIS — Z202 Contact with and (suspected) exposure to infections with a predominantly sexual mode of transmission: Secondary | ICD-10-CM | POA: Diagnosis present

## 2017-10-25 DIAGNOSIS — E1143 Type 2 diabetes mellitus with diabetic autonomic (poly)neuropathy: Secondary | ICD-10-CM | POA: Insufficient documentation

## 2017-10-25 DIAGNOSIS — E118 Type 2 diabetes mellitus with unspecified complications: Secondary | ICD-10-CM | POA: Insufficient documentation

## 2017-10-25 DIAGNOSIS — A419 Sepsis, unspecified organism: Secondary | ICD-10-CM | POA: Diagnosis not present

## 2017-10-25 DIAGNOSIS — E1022 Type 1 diabetes mellitus with diabetic chronic kidney disease: Secondary | ICD-10-CM | POA: Diagnosis present

## 2017-10-25 DIAGNOSIS — E10621 Type 1 diabetes mellitus with foot ulcer: Secondary | ICD-10-CM | POA: Diagnosis present

## 2017-10-25 DIAGNOSIS — L03115 Cellulitis of right lower limb: Secondary | ICD-10-CM | POA: Diagnosis present

## 2017-10-25 DIAGNOSIS — L03031 Cellulitis of right toe: Secondary | ICD-10-CM | POA: Diagnosis not present

## 2017-10-25 DIAGNOSIS — E1043 Type 1 diabetes mellitus with diabetic autonomic (poly)neuropathy: Secondary | ICD-10-CM | POA: Diagnosis present

## 2017-10-25 DIAGNOSIS — E119 Type 2 diabetes mellitus without complications: Secondary | ICD-10-CM

## 2017-10-25 DIAGNOSIS — L089 Local infection of the skin and subcutaneous tissue, unspecified: Secondary | ICD-10-CM

## 2017-10-25 DIAGNOSIS — L97419 Non-pressure chronic ulcer of right heel and midfoot with unspecified severity: Secondary | ICD-10-CM | POA: Diagnosis present

## 2017-10-25 DIAGNOSIS — E108 Type 1 diabetes mellitus with unspecified complications: Secondary | ICD-10-CM | POA: Diagnosis present

## 2017-10-25 DIAGNOSIS — I129 Hypertensive chronic kidney disease with stage 1 through stage 4 chronic kidney disease, or unspecified chronic kidney disease: Secondary | ICD-10-CM | POA: Diagnosis not present

## 2017-10-25 DIAGNOSIS — M86171 Other acute osteomyelitis, right ankle and foot: Secondary | ICD-10-CM | POA: Diagnosis present

## 2017-10-25 DIAGNOSIS — L02611 Cutaneous abscess of right foot: Secondary | ICD-10-CM | POA: Diagnosis not present

## 2017-10-25 DIAGNOSIS — M86179 Other acute osteomyelitis, unspecified ankle and foot: Secondary | ICD-10-CM | POA: Diagnosis not present

## 2017-10-25 DIAGNOSIS — L03032 Cellulitis of left toe: Secondary | ICD-10-CM

## 2017-10-25 DIAGNOSIS — F419 Anxiety disorder, unspecified: Secondary | ICD-10-CM | POA: Diagnosis present

## 2017-10-25 DIAGNOSIS — R739 Hyperglycemia, unspecified: Secondary | ICD-10-CM | POA: Diagnosis not present

## 2017-10-25 DIAGNOSIS — R809 Proteinuria, unspecified: Secondary | ICD-10-CM | POA: Diagnosis not present

## 2017-10-25 DIAGNOSIS — S91301A Unspecified open wound, right foot, initial encounter: Secondary | ICD-10-CM | POA: Diagnosis not present

## 2017-10-25 DIAGNOSIS — E1169 Type 2 diabetes mellitus with other specified complication: Secondary | ICD-10-CM | POA: Diagnosis not present

## 2017-10-25 DIAGNOSIS — A401 Sepsis due to streptococcus, group B: Secondary | ICD-10-CM | POA: Diagnosis not present

## 2017-10-25 HISTORY — DX: Sepsis, unspecified organism: A41.9

## 2017-10-25 LAB — I-STAT CG4 LACTIC ACID, ED
Lactic Acid, Venous: 2.03 mmol/L (ref 0.5–1.9)
Lactic Acid, Venous: 1.27 mmol/L (ref 0.5–1.9)

## 2017-10-25 LAB — CBC WITH DIFFERENTIAL/PLATELET
Band Neutrophils: 0 %
Basophils Absolute: 0 10*3/uL (ref 0.0–0.1)
Basophils Relative: 0 %
Blasts: 0 %
Eosinophils Absolute: 0 10*3/uL (ref 0.0–0.7)
Eosinophils Relative: 0 %
HCT: 31.3 % — ABNORMAL LOW (ref 39.0–52.0)
Hemoglobin: 10.3 g/dL — ABNORMAL LOW (ref 13.0–17.0)
Lymphocytes Relative: 7 %
Lymphs Abs: 1.7 10*3/uL (ref 0.7–4.0)
MCH: 31 pg (ref 26.0–34.0)
MCHC: 32.9 g/dL (ref 30.0–36.0)
MCV: 94.3 fL (ref 78.0–100.0)
Metamyelocytes Relative: 0 %
Monocytes Absolute: 3 10*3/uL — ABNORMAL HIGH (ref 0.1–1.0)
Monocytes Relative: 12 %
Myelocytes: 0 %
Neutro Abs: 20 10*3/uL — ABNORMAL HIGH (ref 1.7–7.7)
Neutrophils Relative %: 81 %
Other: 0 %
Platelets: 186 10*3/uL (ref 150–400)
Promyelocytes Absolute: 0 %
RBC: 3.32 MIL/uL — ABNORMAL LOW (ref 4.22–5.81)
RDW: 14.4 % (ref 11.5–15.5)
WBC: 24.7 10*3/uL — ABNORMAL HIGH (ref 4.0–10.5)
nRBC: 0 /100 WBC

## 2017-10-25 LAB — COMPREHENSIVE METABOLIC PANEL
ALT: 10 U/L — ABNORMAL LOW (ref 17–63)
AST: 18 U/L (ref 15–41)
Albumin: 3.4 g/dL — ABNORMAL LOW (ref 3.5–5.0)
Alkaline Phosphatase: 114 U/L (ref 38–126)
Anion gap: 21 — ABNORMAL HIGH (ref 5–15)
BUN: 19 mg/dL (ref 6–20)
CO2: 20 mmol/L — ABNORMAL LOW (ref 22–32)
Calcium: 8.8 mg/dL — ABNORMAL LOW (ref 8.9–10.3)
Chloride: 89 mmol/L — ABNORMAL LOW (ref 101–111)
Creatinine, Ser: 5.81 mg/dL — ABNORMAL HIGH (ref 0.61–1.24)
GFR calc Af Amer: 13 mL/min — ABNORMAL LOW (ref >60)
GFR calc non Af Amer: 11 mL/min — ABNORMAL LOW (ref >60)
Glucose, Bld: 189 mg/dL — ABNORMAL HIGH (ref 65–99)
Potassium: 4.6 mmol/L (ref 3.5–5.1)
Sodium: 130 mmol/L — ABNORMAL LOW (ref 135–145)
Total Bilirubin: 2 mg/dL — ABNORMAL HIGH (ref 0.3–1.2)
Total Protein: 7.1 g/dL (ref 6.5–8.1)

## 2017-10-25 LAB — PROTIME-INR
INR: 1.16
Prothrombin Time: 14.7 seconds (ref 11.4–15.2)

## 2017-10-25 LAB — CBG MONITORING, ED: Glucose-Capillary: 186 mg/dL — ABNORMAL HIGH (ref 65–99)

## 2017-10-25 MED ORDER — SPIRONOLACTONE 50 MG PO TABS: 50.0000 mg | ORAL_TABLET | Freq: Every day | ORAL | Status: DC

## 2017-10-25 MED ORDER — ONDANSETRON HCL 4 MG/2ML IJ SOLN
4.0000 mg | Freq: Once | INTRAMUSCULAR | Status: AC
  Administered 2017-10-25: 4 mg via INTRAVENOUS
  Filled 2017-10-25: qty 2

## 2017-10-25 MED ORDER — OXYCODONE-ACETAMINOPHEN 5-325 MG PO TABS: 1.0000 | ORAL_TABLET | Freq: Four times a day (QID) | ORAL | Status: DC | PRN

## 2017-10-25 MED ORDER — ACETAMINOPHEN 325 MG PO TABS
650.0000 mg | ORAL_TABLET | Freq: Four times a day (QID) | ORAL | Status: DC | PRN
  Administered 2017-10-27: 650 mg via ORAL
  Filled 2017-10-25: qty 2

## 2017-10-25 MED ORDER — ACETAMINOPHEN 500 MG PO TABS
1000.0000 mg | ORAL_TABLET | Freq: Once | ORAL | Status: AC
  Administered 2017-10-25: 1000 mg via ORAL
  Filled 2017-10-25: qty 2

## 2017-10-25 MED ORDER — ACETAMINOPHEN 650 MG RE SUPP
650.0000 mg | Freq: Four times a day (QID) | RECTAL | Status: DC | PRN
Start: 2017-10-25 — End: 2017-11-03

## 2017-10-25 MED ORDER — CLONAZEPAM 0.5 MG PO TABS
0.5000 mg | ORAL_TABLET | Freq: Every day | ORAL | Status: DC | PRN
  Administered 2017-10-25: 0.5 mg via ORAL
  Filled 2017-10-25 (×2): qty 1

## 2017-10-25 MED ORDER — ONDANSETRON HCL 4 MG PO TABS
4.0000 mg | ORAL_TABLET | Freq: Four times a day (QID) | ORAL | Status: DC | PRN
Start: 2017-10-25 — End: 2017-11-03
  Administered 2017-10-26 – 2017-10-30 (×5): 4 mg via ORAL
  Filled 2017-10-25 (×5): qty 1

## 2017-10-25 MED ORDER — IPRATROPIUM-ALBUTEROL 0.5-2.5 (3) MG/3ML IN SOLN: 3.0000 mL | RESPIRATORY_TRACT | Status: DC | PRN

## 2017-10-25 MED ORDER — INSULIN GLARGINE 100 UNIT/ML ~~LOC~~ SOLN
6.0000 [IU] | Freq: Every day | SUBCUTANEOUS | Status: DC
  Administered 2017-10-26 (×2): 6 [IU] via SUBCUTANEOUS
  Filled 2017-10-25 (×2): qty 0.06

## 2017-10-25 MED ORDER — PIPERACILLIN-TAZOBACTAM 3.375 G IVPB
3.3750 g | Freq: Two times a day (BID) | INTRAVENOUS | Status: DC
  Administered 2017-10-26 – 2017-10-29 (×7): 3.375 g via INTRAVENOUS
  Filled 2017-10-25 (×9): qty 50

## 2017-10-25 MED ORDER — CARVEDILOL 12.5 MG PO TABS: 25.0000 mg | ORAL_TABLET | Freq: Two times a day (BID) | ORAL | Status: DC

## 2017-10-25 MED ORDER — SEVELAMER CARBONATE 800 MG PO TABS
3200.0000 mg | ORAL_TABLET | Freq: Three times a day (TID) | ORAL | Status: DC
  Administered 2017-10-26: 4000 mg via ORAL
  Administered 2017-10-27 – 2017-10-28 (×4): 3200 mg via ORAL
  Filled 2017-10-25 (×2): qty 4
  Filled 2017-10-25 (×2): qty 5
  Filled 2017-10-25: qty 4
  Filled 2017-10-25: qty 5
  Filled 2017-10-25: qty 4
  Filled 2017-10-25: qty 5

## 2017-10-25 MED ORDER — INSULIN ASPART 100 UNIT/ML ~~LOC~~ SOLN
8.0000 [IU] | Freq: Three times a day (TID) | SUBCUTANEOUS | Status: DC
  Administered 2017-10-26 (×2): 8 [IU] via SUBCUTANEOUS
  Filled 2017-10-25 (×2): qty 1

## 2017-10-25 MED ORDER — IRBESARTAN 150 MG PO TABS
150.0000 mg | ORAL_TABLET | Freq: Every day | ORAL | Status: DC
Start: 2017-10-26 — End: 2017-10-25

## 2017-10-25 MED ORDER — VANCOMYCIN HCL IN DEXTROSE 1-5 GM/200ML-% IV SOLN
1000.0000 mg | Freq: Once | INTRAVENOUS | Status: AC
  Administered 2017-10-25: 1000 mg via INTRAVENOUS
  Filled 2017-10-25: qty 200

## 2017-10-25 MED ORDER — LOSARTAN POTASSIUM 50 MG PO TABS
50.0000 mg | ORAL_TABLET | Freq: Every day | ORAL | Status: DC
  Administered 2017-10-26: 50 mg via ORAL
  Filled 2017-10-25: qty 1

## 2017-10-25 MED ORDER — ONDANSETRON HCL 4 MG/2ML IJ SOLN
4.0000 mg | Freq: Four times a day (QID) | INTRAMUSCULAR | Status: DC | PRN
  Administered 2017-10-26 – 2017-10-31 (×3): 4 mg via INTRAVENOUS
  Filled 2017-10-25 (×4): qty 2

## 2017-10-25 MED ORDER — CINACALCET HCL 30 MG PO TABS
30.0000 mg | ORAL_TABLET | Freq: Every day | ORAL | Status: DC
  Administered 2017-10-26 – 2017-11-02 (×7): 30 mg via ORAL
  Filled 2017-10-25 (×10): qty 1

## 2017-10-25 MED ORDER — PIPERACILLIN-TAZOBACTAM 3.375 G IVPB 30 MIN
3.3750 g | Freq: Once | INTRAVENOUS | Status: AC
  Administered 2017-10-25: 3.375 g via INTRAVENOUS
  Filled 2017-10-25: qty 50

## 2017-10-25 MED ORDER — AMLODIPINE BESYLATE 10 MG PO TABS
10.0000 mg | ORAL_TABLET | Freq: Every day | ORAL | Status: DC
  Administered 2017-10-26: 10 mg via ORAL
  Filled 2017-10-25: qty 2

## 2017-10-25 MED ORDER — METOCLOPRAMIDE HCL 10 MG PO TABS: 5.0000 mg | ORAL_TABLET | Freq: Three times a day (TID) | ORAL | Status: DC

## 2017-10-25 MED ORDER — VANCOMYCIN HCL IN DEXTROSE 1-5 GM/200ML-% IV SOLN
1000.0000 mg | INTRAVENOUS | Status: DC
  Filled 2017-10-25: qty 200

## 2017-10-25 MED ORDER — HEPARIN SODIUM (PORCINE) 5000 UNIT/ML IJ SOLN
5000.0000 [IU] | Freq: Three times a day (TID) | INTRAMUSCULAR | Status: DC
  Filled 2017-10-25 (×14): qty 1

## 2017-10-25 NOTE — Progress Notes (Signed)
   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  Patient states that he was at dialysis this morning and he was concerned about the foot.  He was instructed to come here immediately for an emergency appointment and for acute treatment and evaluation.  He presents today at the office very distressed regarding his foot.  He states that he feels the burning sensation coming up his leg.  He is also concerned due to discoloration of the foot as well.  He is very concerned during our exam today.  Past Medical History:  Diagnosis Date  . Diabetes mellitus without complication ([place])   . Diabetic gastroparesis ([place])   . Dialysis patient ([place])   . Hypertension   . Renal disorder    Dialysis          Physical Exam: General: The patient is alert and oriented x3  Dermatology: Ulcer noted to the plantar aspect of the sub-fifth MPJ right foot measuring approximately 1.5 x 1.5 x 0.3 cm.  Minimal drainage noted.  There is no eschar.  There is a moderate amount of slough fibrin necrotic tissue noted.  Currently there does not appear to be any exposed bone muscle tendon ligament or joint.  No malodor noted.  Periwound integrity is extremely callused. There is some bullous blister lesions encompassing the great toe and first webspace to the right foot.  Blisters are intact.  There is also some red discoloration noted superficially.  Vascular: Palpable pedal pulses bilaterally.  The right foot ankle and leg is extremely hot compared to the contralateral limb consistent with an acute cellulitis.  Moderate edema noted to the right foot and ankle.   Neurological: Epicritic and protective threshold absent bilaterally.   Musculoskeletal Exam: No deformity noted.  Negative for any significant pain on palpation  Radiographic Exam:  Normal osseous mineralization. Joint spaces preserved. No  fracture/dislocation/boney destruction.  No obvious radiolucencies that would indicate gas gangrene.  Assessment: -Ulcer right foot secondary to diabetes mellitus -Acute cellulitis right foot ankle and leg   Plan of Care:  -Patient was evaluated today.  X-rays reviewed today. -Due to the patient's symptoms and findings I recommend that he present immediately to the emergency department upon leaving the office today.  Patient understands.  Patient would likely benefit from blood work and IV antibiotics with likely admission depending on findings in the emergency department -If the patient is admitted, would recommend inpatient follow-up and consult with Dr. Hardie Pulley, DPM since he has an established patient care relationship regarding the ulcer -Return to clinic post discharge   Edrick Kins, DPM [facility]  Dr. Edrick Kins, DPM    [address], [place] [address]                Office [phone]  Fax [phone]

## 2017-10-25 NOTE — ED Triage Notes (Signed)
Pt sts has been having right foot pain that radiates up right leg. Pt endorses ulcer on posterior aspect of foot-  hot to touch and tender. No drainage. Pt sts was sent here [facility] here.

## 2017-10-25 NOTE — H&P (Addendum)
History and Physical    [name]:[id number] DOB: May 22, 1981 DOA: 10/25/2017  Referring MD/NP/PA: Providence Lanius, PA-C  PCP: Patient, No Pcp Per  Patient coming from: Podiatry office  Chief Complaint: Right foot swelling  I have personally briefly reviewed patient's old medical records in [place]   HPI: [name] is a 37 y.o. male with medical history significant of DM type I, ESRD on HD, and diabetic foot ulcer followed by Dr. March Rummage; who presents with complaints of right foot swelling.  During hemodialysis it was noticed that he had swelling and red discoloration of his right foot.  He states that he has no feeling in his feet, and did not noticed the symptoms until they pointed out.  He does not recall any recent trauma or injury to his foot.  He was able to complete his full hemodialysis session today.  Patient has a known ulceration of the lateral aspect of the right foot that he states is been there for years.  He had just recently reestablished care and have been being followed by Dr. March Rummage of podiatry.  He was made a urgent appointment today at his podiatrist's office and was seen by Dr. Daylene Katayama today.  X-rays were obtained which showed no signs of osseous involvement, but he was sent to the emergency department for need of IV antibiotics.  Associated symptoms include complaints of intermittent subjective fever, chills, and nausea.  Denies having any shortness of breath, chest pain, vomiting, or diarrhea symptoms.  He mentions some concern for possible sexually transmitted disease.  ED Course: Upon admission into the emergency department patient was noted to be febrile to 104.1 F, pulse 69-110, respirations 17-22, blood pressure 143/76-170 2/97, and O2 saturation maintained on room air.  Labs revealed WBC 24.7, hemoglobin 10.3, sodium 130, potassium 4.6, BUN 19, creatinine 5.8 on, glucose 189, and initial lactic acid 2.03.  Chest x-ray showing cardiomegaly without any  active disease.  Patient was given empiric antibiotics of Vanco and Zosyn TRH called to admit.  Review of Systems  Constitutional: Positive for chills and fever.  HENT: Negative for ear discharge and nosebleeds.   Eyes: Negative for pain and discharge.  Respiratory: Negative for cough and shortness of breath.   Cardiovascular: Positive for leg swelling. Negative for chest pain.  Gastrointestinal: Positive for nausea. Negative for abdominal pain, diarrhea and vomiting.  Genitourinary: Negative for urgency.  Musculoskeletal: Negative for falls.  Skin:       Positive for skin color change of the right foot  Neurological: Negative for focal weakness and seizures.  Endo/Heme/Allergies: Negative for polydipsia.  Psychiatric/Behavioral: Negative for suicidal ideas. The patient is nervous/anxious.      Past Medical History:  Diagnosis Date  . Diabetes mellitus without complication ([place])   . Diabetic gastroparesis ([place])   . Dialysis patient ([place])   . Hypertension   . Renal disorder    Dialysis    Past Surgical History:  Procedure Laterality Date  . AV FISTULA PLACEMENT     left arm.  . AV FISTULA PLACEMENT Right 12/22/2016   Procedure: INSERTION OF ARTERIOVENOUS (AV) GORE-TEX GRAFT ARM;  Surgeon: Elam Dutch, MD;  Location: [place];  Service: Vascular;  Laterality: Right;  . EYE SURGERY    . INSERTION OF DIALYSIS CATHETER     Right subclavian  . UPPER EXTREMITY VENOGRAPHY N/A 11/16/2016   Procedure: Upper Extremity Venography - Right Central;  Surgeon: Elam Dutch, MD;  Location: [place]  [facility];  Service: Cardiovascular;  Laterality: N/A;     reports that  has never smoked. he has never used smokeless tobacco. He reports that he does not drink alcohol or use drugs.  Allergies  Allergen Reactions  . No Known Allergies     Family History  Problem Relation Age of Onset  . Diabetes Mellitus II Unknown     Prior to Admission medications   Medication Sig Start Date  End Date Taking? Authorizing Provider  amLODipine (NORVASC) 10 MG tablet Take 10 mg by mouth daily.    [provider]  carvedilol (COREG) 25 MG tablet Take 25 mg by mouth 2 (two) times daily with a meal.    [provider]  cinacalcet (SENSIPAR) 30 MG tablet Take 30 mg by mouth daily with supper.    [provider]  insulin aspart (NOVOLOG) 100 UNIT/ML injection Inject 8 Units into the skin 3 (three) times daily before meals.     [provider]  insulin glargine (LANTUS) 100 UNIT/ML injection Inject 0.12 mLs (12 Units total) into the skin at bedtime. Patient taking differently: Inject 6 Units into the skin at bedtime.  05/29/15   Hillary Bow, MD  metoCLOPramide (REGLAN) 5 MG tablet Take 5 mg by mouth 3 (three) times daily before meals.    [provider]  oxyCODONE-acetaminophen (PERCOCET/ROXICET) 5-325 MG tablet Take 1-2 tablets by mouth every 6 (six) hours as needed. 12/22/16   Elam Dutch, MD  sevelamer carbonate (RENVELA) 800 MG tablet Take 1,600-4,000 mg by mouth See admin instructions. 3,200 mg-4,000 mg three times a day with meals and 1,600 mg two times a day with snacks    [provider]  spironolactone (ALDACTONE) 50 MG tablet Take 50 mg by mouth daily.    [provider]  valsartan (DIOVAN) 160 MG tablet Take 160 mg by mouth 2 (two) times daily.    [provider]    Physical Exam:  Constitutional: NAD, calm, comfortable Vitals:   10/25/17 2000 10/25/17 2030 10/25/17 2133 10/25/17 2300  BP: (!) 143/76 (!) 145/78  (!) 154/86  Pulse: 95 99  94  Resp: 18 17    Temp:   (!) 100.9 F (38.3 C)   TempSrc:   Oral   SpO2: 100% 100%  100%  Weight:      Height:       Eyes: PERRL, lids and conjunctivae normal ENMT: Mucous membranes are moist. Posterior pharynx clear of any exudate or lesions.Normal dentition.  Neck: normal, supple, no masses, no thyromegaly Respiratory: clear to auscultation bilaterally, no  wheezing, no crackles. Normal respiratory effort. No accessory muscle use.  Cardiovascular: Regular rate and rhythm, no murmurs / rubs / gallops. No extremity edema. 2+ pedal pulses. No carotid bruits.  Abdomen: no tenderness, no masses palpated. No hepatosplenomegaly. Bowel sounds positive.  Musculoskeletal: no clubbing / cyanosis. No joint deformity upper and lower extremities. Good ROM, no contractures. Normal muscle tone.  Skin: 2 cm ulceration of the lateral aspect of the right foot with erythema of the dorsal aspect between the first and second digit.         Neurologic: CN 2-12 grossly intact. Sensation abnormal, DTR normal. Strength 5/5 in all 4.  Psychiatric: Normal judgment and insight. Alert and oriented x 3. Normal mood.     Labs on Admission: I have personally reviewed following labs and imaging studies  CBC: Recent Labs  Lab 10/25/17 1800  WBC 24.7*  NEUTROABS 20.0*  HGB  10.3*  HCT 31.3*  MCV 94.3  PLT 676   Basic Metabolic Panel: Recent Labs  Lab 10/25/17 1800  NA 130*  K 4.6  CL 89*  CO2 20*  GLUCOSE 189*  BUN 19  CREATININE 5.81*  CALCIUM 8.8*   GFR: Estimated Creatinine Clearance: 18.2 mL/min (A) (by C-G formula based on SCr of 5.81 mg/dL (H)). Liver Function Tests: Recent Labs  Lab 10/25/17 1800  AST 18  ALT 10*  ALKPHOS 114  BILITOT 2.0*  PROT 7.1  ALBUMIN 3.4*   No results for input(s): LIPASE, AMYLASE in the last 168 hours. No results for input(s): AMMONIA in the last 168 hours. Coagulation Profile: Recent Labs  Lab 10/25/17 1800  INR 1.16   Cardiac Enzymes: No results for input(s): CKTOTAL, CKMB, CKMBINDEX, TROPONINI in the last 168 hours. BNP (last 3 results) No results for input(s): PROBNP in the last 8760 hours. HbA1C: No results for input(s): HGBA1C in the last 72 hours. CBG: Recent Labs  Lab 10/25/17 1928  GLUCAP 186*   Lipid Profile: No results for input(s): CHOL, HDL, LDLCALC, TRIG, CHOLHDL, LDLDIRECT in the  last 72 hours. Thyroid Function Tests: No results for input(s): TSH, T4TOTAL, FREET4, T3FREE, THYROIDAB in the last 72 hours. Anemia Panel: No results for input(s): VITAMINB12, FOLATE, FERRITIN, TIBC, IRON, RETICCTPCT in the last 72 hours. Urine analysis:    Component Value Date/Time   COLORURINE YELLOW 05/26/2015 1115   APPEARANCEUR CLEAR 05/26/2015 1115   APPEARANCEUR Hazy 07/30/2014 2303   LABSPEC 1.016 05/26/2015 1115   LABSPEC 1.012 07/30/2014 2303   PHURINE 8.0 05/26/2015 1115   GLUCOSEU 500 (A) 05/26/2015 1115   GLUCOSEU >=500 07/30/2014 2303   HGBUR MODERATE (A) 05/26/2015 1115   BILIRUBINUR NEGATIVE 05/26/2015 1115   BILIRUBINUR Negative 07/30/2014 2303   KETONESUR 15 (A) 05/26/2015 1115   PROTEINUR >300 (A) 05/26/2015 1115   UROBILINOGEN 0.2 05/26/2015 1115   NITRITE NEGATIVE 05/26/2015 1115   LEUKOCYTESUR NEGATIVE 05/26/2015 1115   LEUKOCYTESUR Negative 07/30/2014 2303   Sepsis Labs: No results found for this or any previous visit (from the past 240 hour(s)).   Radiological Exams on Admission: Dg Chest 2 View  Result Date: 10/25/2017 CLINICAL DATA:  Fever, diabetic wound for 1 day. EXAM: CHEST  2 VIEW COMPARISON:  07/27/2014. FINDINGS: Cardiomegaly. No consolidation or edema. No effusion or pneumothorax. Bones unremarkable. Compared with priors, dialysis catheter has been removed. IMPRESSION: Cardiomegaly.  No active disease. Electronically Signed   By: Staci Righter M.D.   On: 10/25/2017 19:33   Dg Foot Complete Right  Result Date: 10/25/2017 Please see detailed radiograph report in office note.   EKG: Independently reviewed.  Sinus rhythm with signs of possible early repolarization.  Assessment/Plan Sepsis 2/2 Cellulitis of right lower extremity, diabetic ulcer: Patient presents with acute swelling and erythema of the right foot.  Initial to be tachycardic with fever up to 104.1 F, WBC 25.2, and lactic acid 2.03.  Sepsis protocol was initiated with empiric  antibiotics of vancomycin and Zosyn.  Patient is followed in the outpatient setting by Dr. March Rummage.  - Admit to medsurg - Follow-up blood cultures - Continue empiric antibiotics of vancomycin and Zosyn - Wound care consult - Repeat CBC in a.m.  - Consider need of consult to podiatry /orthopedics  Concern for STD:  - Follow-up HIV and G/C probe  - Continue to advise on safe sex practices.  ESRD on HD:  Patient normally dialyzes M/W/F and was able to received his regular  scheduled session.  Initial potassium 4.6, BUN 19, and creatinine 5.81. - Continue sensipar - Dr. Jimmy Footman consulted, follow-up for further recommendations  Diabetes mellitus type 1: Patient's last hemoglobin A1c noted to be 5.8 in 07/2014. - Hypoglycemic protocols - Check hemoglobin A1c  - Continue home regimen insulin regimen as tolerated - Adjust insulin regimen as needed  Essential hypertension - Continue amlodipine, Coreg, losartan, and pharmacy substitution of irbesartan   Anxiety - Continue Klonopin prn  DVT prophylaxis:heparin  Code Status: full  Family Communication: No family present at bedside Disposition Plan:  Likely discharge home in 1-2 days Consults called: none  Admission status: inpatient   [name] MD [facility] Pager [phone]   If 7PM-7AM, please contact night-coverage [email or web address] Password [email or web address]  10/25/2017, 11:13 PM

## 2017-10-25 NOTE — ED Notes (Signed)
Pt encouraged to use restroom at this time.

## 2017-10-25 NOTE — ED Notes (Signed)
Pt states he would like to be checked for an STD because his partner was tested positive. Pt denies any s/s of.

## 2017-10-25 NOTE — ED Notes (Signed)
ED Provider at bedside. 

## 2017-10-25 NOTE — ED Provider Notes (Signed)
  Face-to-face evaluation   History: He presents for worsening pain swelling and discoloration of the right foot, over the last couple weeks.  He has been seen by podiatry, today received antibiotics during hemodialysis.  Physical exam: Alert, calm, cooperative.  No respiratory distress.  Right foot tender and swollen, plantar forefoot with large lateral ulceration.    Date: 10/25/2017  Rate: 97  Rhythm: normal sinus rhythm  QRS Axis: normal  PR and QT Intervals: normal  ST/T Wave abnormalities: nonspecific ST changes  PR and QRS Conduction Disutrbances:none  Narrative Interpretation:   Old EKG Reviewed: unchanged   Medical screening examination/treatment/procedure(s) were conducted as a shared visit with non-physician practitioner(s) and myself.  I personally evaluated the patient during the encounter    Daleen Bo, MD 10/26/17 1200

## 2017-10-25 NOTE — ED Provider Notes (Signed)
[facility] EMERGENCY DEPARTMENT Provider Note   CSN: [id number] Arrival date & time: 10/25/17  1701     History   Chief Complaint Chief Complaint  Patient presents with  . Leg Pain    HPI JERAMYAH GOODPASTURE is a 37 y.o. male with PMH/o DM, end-stage renal disease, hypertension who presents for evaluation of right foot pain, redness and swelling.  Patient reports that he went for evaluation of his foot at the podiatry center and was prompted to go to the emergency department for further evaluation.  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  He states that he is being evaluated by podiatry for evaluation of ulcers to the plantar aspects of his feet.  Patient reports that at dialysis, they noticed worsening redness and symptoms for an emergency appointment with podiatrist who then prompted to go to the emergency department.  Patient reports that he has felt "not himself"for the last few days.  He reports subjective fever and chills.  Patient states that he has been monitoring his blood sugar and states that is been ranging between 200 300.  He has been taking his insulin.  Patient denies any chest pain, difficulty breathing.  States that he is also concerned about a possible STD.  He states that his partner called him and states that she was worried about an STD.  She states that she has not gotten tested yet.  The history is provided by the patient.    Past Medical History:  Diagnosis Date  . Diabetes mellitus without complication ([place])   . Diabetic gastroparesis ([place])   . Dialysis patient ([place])   . Hypertension   . Renal disorder    Dialysis    Patient Active Problem List   Diagnosis Date Noted  . Diabetes mellitus type 1 ([place]) 10/25/2017  . Diabetic gastroparesis ([place]) 10/25/2017  . Hemodialysis patient ([place]) 10/25/2017  . Sepsis ([place]) 10/25/2017  . DKA (diabetic ketoacidoses) ([place]) 05/27/2015  .  Essential (primary) hypertension 08/09/2014  . ESRD (end stage renal disease) on dialysis ([place]) 03/19/2014  . Pain, abdominal, generalized 03/19/2014  . Bacteremia due to coagulase-negative Staphylococcus 05/31/2013  . Leukocytosis 05/31/2013  . Hyponatremia 05/29/2013  . Metabolic acidosis 48/18/5631    Past Surgical History:  Procedure Laterality Date  . AV FISTULA PLACEMENT     left arm.  . AV FISTULA PLACEMENT Right 12/22/2016   Procedure: INSERTION OF ARTERIOVENOUS (AV) GORE-TEX GRAFT ARM;  Surgeon: Elam Dutch, MD;  Location: [facility];  Service: Vascular;  Laterality: Right;  . EYE SURGERY    . INSERTION OF DIALYSIS CATHETER     Right subclavian  . UPPER EXTREMITY VENOGRAPHY N/A 11/16/2016   Procedure: Upper Extremity Venography - Right Central;  Surgeon: Elam Dutch, MD;  Location: [facility];  Service: Cardiovascular;  Laterality: N/A;       Home Medications    Prior to Admission medications   Medication Sig Start Date End Date Taking? Authorizing Provider  Acetaminophen (TYLENOL PO) Take 1-2 tablets by mouth daily as needed (pain/headache).   Yes [provider]  amLODipine (NORVASC) 10 MG tablet Take 10 mg by mouth daily.   Yes [provider]  cinacalcet (SENSIPAR) 30 MG tablet Take 30 mg by mouth daily with supper.   Yes [provider]  clonazePAM (KLONOPIN) 0.5 MG tablet Take 0.5 mg by mouth daily as needed for anxiety (sleep).  07/21/17  Yes [provider]  insulin aspart (NOVOLOG) 100 UNIT/ML injection Inject 5 Units into the skin 2 (two) times daily with a meal.    Yes [provider]  insulin glargine (LANTUS) 100 UNIT/ML injection Inject 0.12 mLs (12 Units total) into the skin at bedtime. Patient taking differently: Inject 5 Units into the skin at bedtime.  05/29/15  Yes Sudini, Alveta Heimlich, MD  losartan (COZAAR) 50 MG tablet Take 50 mg by mouth daily. 09/27/17  Yes [provider]  sevelamer  carbonate (RENVELA) 800 MG tablet Take 2,400-3,200 mg by mouth See admin instructions. Take 3 - 4 tablets (2400 mg -3200 mg) by mouth two times daily with meals   Yes [provider]  oxyCODONE-acetaminophen (PERCOCET/ROXICET) 5-325 MG tablet Take 1-2 tablets by mouth every 6 (six) hours as needed. Patient not taking: Reported on 10/25/2017 12/22/16   Elam Dutch, MD    Family History Family History  Problem Relation Age of Onset  . Diabetes Mellitus II Unknown     Social History Social History   Tobacco Use  . Smoking status: Never Smoker  . Smokeless tobacco: Never Used  Substance Use Topics  . Alcohol use: No  . Drug use: No     Allergies   No known allergies   Review of Systems Review of Systems  Constitutional: Positive for fever. Negative for chills.  Respiratory: Negative for cough and shortness of breath.   Cardiovascular: Negative for chest pain.  Gastrointestinal: Negative for abdominal pain, diarrhea, nausea and vomiting.  Genitourinary: Negative for discharge and penile pain.  Musculoskeletal: Negative for back pain and neck pain.  Skin: Positive for color change and wound. Negative for rash.  Neurological: Negative for dizziness, weakness, numbness and headaches.  Psychiatric/Behavioral: Negative for confusion.  All other systems reviewed and are negative.    Physical Exam Updated Vital Signs BP (!) 154/86   Pulse 94   Temp (!) 100.9 F (38.3 C) (Oral)   Resp 17   Ht 5\' 6"  (1.676 m)   Wt 90.7 kg (200 lb)   SpO2 100%   BMI 32.28 kg/m   Physical Exam  Constitutional: He is oriented to person, place, and time. He appears well-developed and well-nourished.  HENT:  Head: Normocephalic and atraumatic.  Mouth/Throat: Oropharynx is clear and moist and mucous membranes are normal.  Eyes: Conjunctivae, EOM and lids are normal. Pupils are equal, round, and reactive to light.  Neck: Full passive range of motion without pain.  Cardiovascular:  Normal rate, regular rhythm, normal heart sounds and normal pulses. Exam reveals no gallop and no friction rub.  No murmur heard. Pulses:      Dorsalis pedis pulses are 2+ on the right side, and 2+ on the left side.  Pulmonary/Chest: Effort normal and breath sounds normal.  Abdominal: Soft. Normal appearance. There is no tenderness. There is no rigidity and no guarding. Hernia confirmed negative in the right inguinal area and confirmed negative in the left inguinal area.  Abdomen is soft, non-distended. No tenderness to palpation  Genitourinary: Testes normal and penis normal. Right testis shows no swelling and no tenderness. Left testis shows no swelling and no tenderness. Circumcised.  Genitourinary Comments: The exam was performed with a chaperone present. Normal external male genitalia. No lesions, rash, or sores.  Musculoskeletal: Normal range of motion.  Tenderness palpation the lateral aspect of the right foot. Dorsiflexion and plantar flexion of right foot appear intact.   Neurological: He is alert and oriented  to person, place, and time.  Sensation intact along major nerve distributions of BLE  Skin: Skin is warm and dry. Capillary refill takes less than 2 seconds.  3 cm circular ulcer noted to the lateral aspect of the patient's right foot with some surrounding skin breakdown.  Right lower extremity is warm, erythematous.  Psychiatric: He has a normal mood and affect. His speech is normal.  Nursing note and vitals reviewed.       ED Treatments / Results  Labs (all labs ordered are listed, but only abnormal results are displayed) Labs Reviewed  COMPREHENSIVE METABOLIC PANEL - Abnormal; Notable for the following components:      Result Value   Sodium 130 (*)    Chloride 89 (*)    CO2 20 (*)    Glucose, Bld 189 (*)    Creatinine, Ser 5.81 (*)    Calcium 8.8 (*)    Albumin 3.4 (*)    ALT 10 (*)    Total Bilirubin 2.0 (*)    GFR calc non Af Amer 11 (*)    GFR calc Af  Amer 13 (*)    Anion gap 21 (*)    All other components within normal limits  CBC WITH DIFFERENTIAL/PLATELET - Abnormal; Notable for the following components:   WBC 24.7 (*)    RBC 3.32 (*)    Hemoglobin 10.3 (*)    HCT 31.3 (*)    Neutro Abs 20.0 (*)    Monocytes Absolute 3.0 (*)    All other components within normal limits  I-STAT CG4 LACTIC ACID, ED - Abnormal; Notable for the following components:   Lactic Acid, Venous 2.03 (*)    All other components within normal limits  CBG MONITORING, ED - Abnormal; Notable for the following components:   Glucose-Capillary 186 (*)    All other components within normal limits  CULTURE, BLOOD (ROUTINE X 2)  CULTURE, BLOOD (ROUTINE X 2)  PROTIME-INR  URINALYSIS, ROUTINE W REFLEX MICROSCOPIC  HIV ANTIBODY (ROUTINE TESTING)  CBC  BASIC METABOLIC PANEL  I-STAT CG4 LACTIC ACID, ED  GC/CHLAMYDIA PROBE AMP ([place]) NOT AT [facility]    EKG  EKG Interpretation None       Radiology Dg Chest 2 View  Result Date: 10/25/2017 CLINICAL DATA:  Fever, diabetic wound for 1 day. EXAM: CHEST  2 VIEW COMPARISON:  07/27/2014. FINDINGS: Cardiomegaly. No consolidation or edema. No effusion or pneumothorax. Bones unremarkable. Compared with priors, dialysis catheter has been removed. IMPRESSION: Cardiomegaly.  No active disease. Electronically Signed   By: Staci Righter M.D.   On: 10/25/2017 19:33   Dg Foot Complete Right  Result Date: 10/25/2017 Please see detailed radiograph report in office note.   Procedures Procedures (including critical care time)  Medications Ordered in ED Medications  piperacillin-tazobactam (ZOSYN) IVPB 3.375 g (not administered)  vancomycin (VANCOCIN) IVPB 1000 mg/200 mL premix (not administered)  sevelamer carbonate (RENVELA) tablet 1,600-4,000 mg (not administered)  oxyCODONE-acetaminophen (PERCOCET/ROXICET) 5-325 MG per tablet 1-2 tablet (not administered)  insulin glargine (LANTUS) injection 6 Units (not administered)   insulin aspart (novoLOG) injection 8 Units (not administered)  cinacalcet (SENSIPAR) tablet 30 mg (not administered)  amLODipine (NORVASC) tablet 10 mg (not administered)  heparin injection 5,000 Units (not administered)  acetaminophen (TYLENOL) tablet 650 mg (not administered)    Or  acetaminophen (TYLENOL) suppository 650 mg (not administered)  ondansetron (ZOFRAN) tablet 4 mg (not administered)    Or  ondansetron (ZOFRAN) injection 4 mg (not administered)  ipratropium-albuterol (  DUONEB) 0.5-2.5 (3) MG/3ML nebulizer solution 3 mL (not administered)  losartan (COZAAR) tablet 50 mg (not administered)  clonazePAM (KLONOPIN) tablet 0.5 mg (0.5 mg Oral Given 10/25/17 2309)  acetaminophen (TYLENOL) tablet 1,000 mg (1,000 mg Oral Given 10/25/17 1752)  piperacillin-tazobactam (ZOSYN) IVPB 3.375 g (0 g Intravenous Stopped 10/25/17 2100)  vancomycin (VANCOCIN) IVPB 1000 mg/200 mL premix (0 mg Intravenous Stopped 10/25/17 2126)  ondansetron (ZOFRAN) injection 4 mg (4 mg Intravenous Given 10/25/17 2137)     Initial Impression / Assessment and Plan / ED Course  I have reviewed the triage vital signs and the nursing notes.  Pertinent labs & imaging results that were available during my care of the patient were reviewed by me and considered in my medical decision making (see chart for details).     37 y.o. M with PMH/oDiabtes, end-stage renal failure, dialysis (M, W, F) who presents for evaluation of worsening redness, swelling, pain to the right lower extremity.  Patient has an ulcer to the lateral aspect of his right foot that has been being evaluated by podiatry for.  At dialysis today, they noticed redness and swelling of the foot and prompted him to go to podiatry for further evaluation.  From there, he was sent to the emergency department for further evaluation.  He does report some subjective fever and chills over the last few days.  On initial ED arrival, patient was febrile, tachycardic,  hypertensive.  Initial labs ordered at triage.  Consider cellulitis.  History/physical exam not concerning for septic arthritis, DVT.  Initial lactic acid was 2.08.  CBC shows leukocytosis of 24.7.  CBC shows bicarb of 20.  Creatinine is 5.1, BUN is 19.  Given concern, code sepsis was initiated. IV antibiotics with pharm consult started.   Patient was seen by Dr. Daylene Katayama (Podiatry) today for evaluation for foot. At that time, an XR was done that was not concerning for subcutaneous gas.   Discussed patient with Dr. Jimmy Footman (Nephrology) regarding need for fluids given sepsis. Does not recommend giving fluid at this time and history of end-stage renal disease and patient's reassuring blood pressures.  Given concerns of cellulitis and need for IV antibiotics will plan for admission.   Discussed patient with Dr. Tamala Julian (hospitalist). Will plan for admission.   Final Clinical Impressions(s) / ED Diagnoses   Final diagnoses:  Cellulitis of right lower extremity    ED Discharge Orders    None       Desma Mcgregor 10/26/17 9381    Daleen Bo, MD 10/26/17 1200

## 2017-10-25 NOTE — ED Notes (Signed)
Pt coming to room from xray

## 2017-10-25 NOTE — Progress Notes (Signed)
Pharmacy Antibiotic Note  [name] is a 37 y.o. male admitted on 10/25/2017 with cellulitis.  Pharmacy has been consulted for vancomycin and Zosyn dosing. Pt is ESRD on dialysis (MWF). Per pt, he received IV antibiotics at dialysis prior to ED admission today, though he does not recall the antibiotic he was given. Pt is febrile with elevated WBC at 23.7.  [name] receives dialysis care at [facility] on [address]. [address] ([phone]) - called and clarified antibiotics given. Patient received Vancomycin 1500 mg x1 and Ceftazidime 2g IV x1.   Plan: Vancomycin 1000mg  IV x1 (already given per EDP) No further Vancomycin at this time due to dose given at HD center.  Will plan for Vancomycin 1000mg  IV qHD-MWF Follow-up HD schedule   Goal Vancomycin trough 15-20 mcg/mL. Zosyn 3.375g IV x1 (already given per EDP) Zosyn 3.375g IV every 12 hours - 4 hr infusion. F/u renal recs, trough @ SS, clinical resolution  Height: 5\' 6"  (167.6 cm) Weight: 200 lb (90.7 kg) IBW/kg (Calculated) : 63.8  Temp (24hrs), Avg:103.3 F (39.6 C), Min:102.5 F (39.2 C), Max:104.1 F (40.1 C)  Recent Labs  Lab 10/25/17 1800 10/25/17 1814 10/25/17 1958  WBC 24.7*  --   --   CREATININE 5.81*  --   --   LATICACIDVEN  --  2.03* 1.27    Estimated Creatinine Clearance: 18.2 mL/min (A) (by C-G formula based on SCr of 5.81 mg/dL (H)).    Allergies  Allergen Reactions  . No Known Allergies     Antimicrobials this admission: Vanc 1/28 >> Zosyn 1/28 >>  Dose adjustments this admission: None  Microbiology results: Pending  Thank you for allowing pharmacy to be a part of this patient's care.  Shelle Iron, PharmD Candidate 10/25/2017 8:37 PM   I discussed / reviewed the pharmacy note by Ms. Lam and I agree with the student's findings and plans as documented.  Sloan Leiter, PharmD, BCPS, BCCCP Clinical Pharmacist Clinical phone 10/25/2017 until 11PM [phone] After hours, please call  [phone] 10/25/2017, 9:13 PM

## 2017-10-26 ENCOUNTER — Encounter (HOSPITAL_COMMUNITY): Payer: Self-pay

## 2017-10-26 ENCOUNTER — Inpatient Hospital Stay (HOSPITAL_COMMUNITY): Payer: Medicare Other

## 2017-10-26 ENCOUNTER — Other Ambulatory Visit: Payer: Self-pay

## 2017-10-26 DIAGNOSIS — E1029 Type 1 diabetes mellitus with other diabetic kidney complication: Secondary | ICD-10-CM

## 2017-10-26 DIAGNOSIS — L03115 Cellulitis of right lower limb: Secondary | ICD-10-CM

## 2017-10-26 DIAGNOSIS — A401 Sepsis due to streptococcus, group B: Secondary | ICD-10-CM

## 2017-10-26 DIAGNOSIS — R809 Proteinuria, unspecified: Secondary | ICD-10-CM

## 2017-10-26 LAB — HEMOGLOBIN A1C
Hgb A1c MFr Bld: 6.2 % — ABNORMAL HIGH (ref 4.8–5.6)
Mean Plasma Glucose: 131.24 mg/dL

## 2017-10-26 LAB — CBC
HCT: 31.9 % — ABNORMAL LOW (ref 39.0–52.0)
Hemoglobin: 10.3 g/dL — ABNORMAL LOW (ref 13.0–17.0)
MCH: 30.8 pg (ref 26.0–34.0)
MCHC: 32.3 g/dL (ref 30.0–36.0)
MCV: 95.5 fL (ref 78.0–100.0)
Platelets: 198 10*3/uL (ref 150–400)
RBC: 3.34 MIL/uL — ABNORMAL LOW (ref 4.22–5.81)
RDW: 14.4 % (ref 11.5–15.5)
WBC: 25.2 10*3/uL — ABNORMAL HIGH (ref 4.0–10.5)

## 2017-10-26 LAB — GLUCOSE, CAPILLARY
Glucose-Capillary: 218 mg/dL — ABNORMAL HIGH (ref 65–99)
Glucose-Capillary: 319 mg/dL — ABNORMAL HIGH (ref 65–99)

## 2017-10-26 LAB — BASIC METABOLIC PANEL
Anion gap: 21 — ABNORMAL HIGH (ref 5–15)
BUN: 28 mg/dL — ABNORMAL HIGH (ref 6–20)
CO2: 19 mmol/L — ABNORMAL LOW (ref 22–32)
Calcium: 9 mg/dL (ref 8.9–10.3)
Chloride: 90 mmol/L — ABNORMAL LOW (ref 101–111)
Creatinine, Ser: 6.96 mg/dL — ABNORMAL HIGH (ref 0.61–1.24)
GFR calc Af Amer: 10 mL/min — ABNORMAL LOW (ref >60)
GFR calc non Af Amer: 9 mL/min — ABNORMAL LOW (ref >60)
Glucose, Bld: 383 mg/dL — ABNORMAL HIGH (ref 65–99)
Potassium: 5.4 mmol/L — ABNORMAL HIGH (ref 3.5–5.1)
Sodium: 130 mmol/L — ABNORMAL LOW (ref 135–145)

## 2017-10-26 LAB — CBG MONITORING, ED
Glucose-Capillary: 357 mg/dL — ABNORMAL HIGH (ref 65–99)
Glucose-Capillary: 312 mg/dL — ABNORMAL HIGH (ref 65–99)

## 2017-10-26 LAB — GC/CHLAMYDIA PROBE AMP (~~LOC~~) NOT AT ARMC
Chlamydia: NEGATIVE
Neisseria Gonorrhea: NEGATIVE

## 2017-10-26 LAB — HIV ANTIBODY (ROUTINE TESTING W REFLEX): HIV Screen 4th Generation wRfx: NONREACTIVE

## 2017-10-26 MED ORDER — OXYCODONE-ACETAMINOPHEN 5-325 MG PO TABS
1.0000 | ORAL_TABLET | Freq: Four times a day (QID) | ORAL | Status: DC | PRN
  Administered 2017-10-28 – 2017-11-03 (×17): 1 via ORAL
  Filled 2017-10-26 (×20): qty 1

## 2017-10-26 MED ORDER — INSULIN ASPART 100 UNIT/ML ~~LOC~~ SOLN
5.0000 [IU] | Freq: Three times a day (TID) | SUBCUTANEOUS | Status: DC
  Administered 2017-10-26 – 2017-10-28 (×4): 5 [IU] via SUBCUTANEOUS

## 2017-10-26 MED ORDER — SODIUM POLYSTYRENE SULFONATE 15 GM/60ML PO SUSP
30.0000 g | Freq: Once | ORAL | Status: DC
  Filled 2017-10-26: qty 120

## 2017-10-26 MED ORDER — LOSARTAN POTASSIUM 50 MG PO TABS
50.0000 mg | ORAL_TABLET | Freq: Every day | ORAL | Status: DC
  Administered 2017-10-27 – 2017-11-02 (×6): 50 mg via ORAL
  Filled 2017-10-26 (×6): qty 1

## 2017-10-26 MED ORDER — METOCLOPRAMIDE HCL 5 MG/ML IJ SOLN
10.0000 mg | Freq: Three times a day (TID) | INTRAMUSCULAR | Status: DC
  Administered 2017-10-26 – 2017-10-27 (×4): 10 mg via INTRAVENOUS
  Filled 2017-10-26 (×4): qty 2

## 2017-10-26 MED ORDER — ONDANSETRON HCL 4 MG/2ML IJ SOLN
4.0000 mg | Freq: Four times a day (QID) | INTRAMUSCULAR | Status: DC | PRN
Start: 2017-10-26 — End: 2017-10-29
  Administered 2017-10-26: 4 mg via INTRAVENOUS

## 2017-10-26 MED ORDER — SODIUM POLYSTYRENE SULFONATE PO POWD
30.0000 g | Freq: Once | ORAL | Status: AC
  Administered 2017-10-26: 30 g via ORAL
  Filled 2017-10-26: qty 30

## 2017-10-26 MED ORDER — PANTOPRAZOLE SODIUM 40 MG IV SOLR
40.0000 mg | INTRAVENOUS | Status: DC
  Administered 2017-10-26: 40 mg via INTRAVENOUS
  Filled 2017-10-26 (×2): qty 40

## 2017-10-26 MED ORDER — INSULIN ASPART 100 UNIT/ML ~~LOC~~ SOLN
0.0000 [IU] | Freq: Three times a day (TID) | SUBCUTANEOUS | Status: DC
Start: 2017-10-26 — End: 2017-11-03
  Administered 2017-10-26: 3 [IU] via SUBCUTANEOUS
  Administered 2017-10-27: 5 [IU] via SUBCUTANEOUS
  Administered 2017-10-27 – 2017-10-28 (×2): 9 [IU] via SUBCUTANEOUS
  Administered 2017-10-28: 3 [IU] via SUBCUTANEOUS
  Administered 2017-10-28: 7 [IU] via SUBCUTANEOUS
  Administered 2017-10-29: 4 [IU] via SUBCUTANEOUS
  Administered 2017-10-30: 5 [IU] via SUBCUTANEOUS
  Administered 2017-10-31: 1 [IU] via SUBCUTANEOUS
  Administered 2017-10-31: 3 [IU] via SUBCUTANEOUS
  Administered 2017-11-02: 9 [IU] via SUBCUTANEOUS
  Administered 2017-11-02: 3 [IU] via SUBCUTANEOUS
  Administered 2017-11-03: 2 [IU] via SUBCUTANEOUS
  Administered 2017-11-03: 9 [IU] via SUBCUTANEOUS

## 2017-10-26 MED ORDER — CLONAZEPAM 0.5 MG PO TABS
0.5000 mg | ORAL_TABLET | Freq: Two times a day (BID) | ORAL | Status: DC | PRN
  Administered 2017-10-26 – 2017-11-02 (×12): 0.5 mg via ORAL
  Filled 2017-10-26 (×13): qty 1

## 2017-10-26 MED ORDER — SEVELAMER CARBONATE 800 MG PO TABS
1600.0000 mg | ORAL_TABLET | ORAL | Status: DC | PRN
  Administered 2017-10-30: 1600 mg via ORAL

## 2017-10-26 MED ORDER — RENA-VITE PO TABS
1.0000 | ORAL_TABLET | Freq: Every day | ORAL | Status: DC
  Administered 2017-10-26 – 2017-11-03 (×8): 1 via ORAL
  Filled 2017-10-26 (×9): qty 1

## 2017-10-26 MED ORDER — AMLODIPINE BESYLATE 10 MG PO TABS
10.0000 mg | ORAL_TABLET | Freq: Every day | ORAL | Status: DC
  Administered 2017-10-27 – 2017-11-02 (×7): 10 mg via ORAL
  Filled 2017-10-26 (×7): qty 1

## 2017-10-26 NOTE — Consult Note (Signed)
[facility] Renal Consultation Note    Indication for Consultation:  Management of ESRD/hemodialysis; anemia, hypertension/volume and secondary hyperparathyroidism  ERX:[id number], No Pcp Per  HPI: [name] is a 37 y.o. male. 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 Past medical history significant for hypertension, and insulin dependent diabetes with gastroparesis and neuropathy.  Patient is compliant with prescribed HD regimen, completing his full treatment yesterday and reaching his EDW.   Poor BS control.  [name] was sent to the ED from the podiatry office yesterday for worsening pain, swelling and discoloration of R foot. Associated symptoms included n/v/d, fever, and chills.  Pertinent findings included fever, WBC 24.7, lactic acid 2.03 and R foot xray completed in podiatry office with no acute findings.  Patient if followed by podiatry for chronic ulcer to the R sub fifth MPJ and has been receiving wound care every 2 weeks with an upcoming appt this week.    Seen and examined at bedside.  States he has had fever, chills, decreased appetite and n/v/d x3 days.  While at HD he vomited x1, and his foot was evaluated by staff who pointed out the acute changes and made him an emergent appointment with his podiatrist. He continues to have n/v/d today, chills improved.  Admits to pain and swelling in R foot, with little improvement.  He is very concerned about his foot and current infection.  Denies SOB, CP, dizziness, headache, and fatigue.  Admitted for management of sepsis related to RLE cellulitis.    Past Medical History:  Diagnosis Date  . Diabetes mellitus without complication ([place])   . Diabetic gastroparesis ([place])   . Dialysis patient ([place])   . Hypertension   . Renal disorder    Dialysis   Past Surgical History:  Procedure Laterality Date  . AV FISTULA PLACEMENT     left arm.  . AV FISTULA PLACEMENT Right 12/22/2016   Procedure: INSERTION OF ARTERIOVENOUS  (AV) GORE-TEX GRAFT ARM;  Surgeon: Elam Dutch, MD;  Location: [place];  Service: Vascular;  Laterality: Right;  . EYE SURGERY    . INSERTION OF DIALYSIS CATHETER     Right subclavian  . UPPER EXTREMITY VENOGRAPHY N/A 11/16/2016   Procedure: Upper Extremity Venography - Right Central;  Surgeon: Elam Dutch, MD;  Location: [place] [facility];  Service: Cardiovascular;  Laterality: N/A;   Family History  Problem Relation Age of Onset  . Diabetes Mellitus II Unknown    Social History:  reports that  has never smoked. he has never used smokeless tobacco. He reports that he does not drink alcohol or use drugs. Allergies  Allergen Reactions  . No Known Allergies    Prior to Admission medications   Medication Sig Start Date End Date Taking? Authorizing Provider  Acetaminophen (TYLENOL PO) Take 1-2 tablets by mouth daily as needed (pain/headache).   Yes [provider]  amLODipine (NORVASC) 10 MG tablet Take 10 mg by mouth daily.   Yes [provider]  cinacalcet (SENSIPAR) 30 MG tablet Take 30 mg by mouth daily with supper.   Yes [provider]  clonazePAM (KLONOPIN) 0.5 MG tablet Take 0.5 mg by mouth daily as needed for anxiety (sleep).  07/21/17  Yes [provider]  insulin aspart (NOVOLOG) 100 UNIT/ML injection Inject 5 Units into the skin 2 (two) times daily with a meal.    Yes [provider]  insulin glargine (LANTUS) 100 UNIT/ML injection Inject 0.12 mLs (12  Units total) into the skin at bedtime. Patient taking differently: Inject 5 Units into the skin at bedtime.  05/29/15  Yes Sudini, Alveta Heimlich, MD  losartan (COZAAR) 50 MG tablet Take 50 mg by mouth daily. 09/27/17  Yes [provider]  sevelamer carbonate (RENVELA) 800 MG tablet Take 2,400-3,200 mg by mouth See admin instructions. Take 3 - 4 tablets (2400 mg -3200 mg) by mouth two times daily with meals   Yes [provider]  oxyCODONE-acetaminophen (PERCOCET/ROXICET)  5-325 MG tablet Take 1-2 tablets by mouth every 6 (six) hours as needed. Patient not taking: Reported on 10/25/2017 12/22/16   Elam Dutch, MD   Current Facility-Administered Medications  Medication Dose Route Frequency Provider Last Rate Last Dose  . acetaminophen (TYLENOL) tablet 650 mg  650 mg Oral Q6H PRN Fuller Plan A, MD       Or  . acetaminophen (TYLENOL) suppository 650 mg  650 mg Rectal Q6H PRN Tamala Julian, Rondell A, MD      . amLODipine (NORVASC) tablet 10 mg  10 mg Oral Daily Smith, Rondell A, MD      . cinacalcet (SENSIPAR) tablet 30 mg  30 mg Oral Q supper Fuller Plan A, MD      . clonazePAM Bobbye Charleston) tablet 0.5 mg  0.5 mg Oral Daily PRN Fuller Plan A, MD   0.5 mg at 10/25/17 2309  . heparin injection 5,000 Units  5,000 Units Subcutaneous Q8H Smith, Rondell A, MD      . insulin aspart (novoLOG) injection 8 Units  8 Units Subcutaneous TID AC [name], MD   8 Units at 10/26/17 [phone]  . insulin glargine (LANTUS) injection 6 Units  6 Units Subcutaneous QHS Fuller Plan A, MD   6 Units at 10/26/17 0000  . ipratropium-albuterol (DUONEB) 0.5-2.5 (3) MG/3ML nebulizer solution 3 mL  3 mL Nebulization Q4H PRN Smith, Rondell A, MD      . losartan (COZAAR) tablet 50 mg  50 mg Oral Daily Smith, Rondell A, MD      . ondansetron (ZOFRAN) tablet 4 mg  4 mg Oral Q6H PRN Fuller Plan A, MD       Or  . ondansetron (ZOFRAN) injection 4 mg  4 mg Intravenous Q6H PRN Smith, Rondell A, MD      . ondansetron (ZOFRAN) injection 4 mg  4 mg Intravenous Q6H PRN Reyne Dumas, MD   4 mg at 10/26/17 0918  . oxyCODONE-acetaminophen (PERCOCET/ROXICET) 5-325 MG per tablet 1 tablet  1 tablet Oral Q6H PRN Reyne Dumas, MD      . piperacillin-tazobactam (ZOSYN) IVPB 3.375 g  3.375 g Intravenous Q12H Sloan Leiter B, RPH 12.5 mL/hr at 10/26/17 0830 3.375 g at 10/26/17 0830  . sevelamer carbonate (RENVELA) tablet 1,600 mg  1,600 mg Oral PRN Reyne Dumas, MD      . sevelamer carbonate (RENVELA)  tablet 3,200-4,000 mg  3,200-4,000 mg Oral TID WC Smith, Rondell A, MD      . Derrill Memo ON 10/27/2017] vancomycin (VANCOCIN) IVPB 1000 mg/200 mL premix  1,000 mg Intravenous Q M,W,F-HD Priscella Mann, RPH       Current Outpatient Medications  Medication Sig Dispense Refill  . Acetaminophen (TYLENOL PO) Take 1-2 tablets by mouth daily as needed (pain/headache).    Marland Kitchen amLODipine (NORVASC) 10 MG tablet Take 10 mg by mouth daily.    . cinacalcet (SENSIPAR) 30 MG tablet Take 30 mg by mouth daily with supper.    . clonazePAM (KLONOPIN) 0.5 MG tablet  Take 0.5 mg by mouth daily as needed for anxiety (sleep).     . insulin aspart (NOVOLOG) 100 UNIT/ML injection Inject 5 Units into the skin 2 (two) times daily with a meal.     . insulin glargine (LANTUS) 100 UNIT/ML injection Inject 0.12 mLs (12 Units total) into the skin at bedtime. (Patient taking differently: Inject 5 Units into the skin at bedtime. ) 10 mL 11  . losartan (COZAAR) 50 MG tablet Take 50 mg by mouth daily.    . sevelamer carbonate (RENVELA) 800 MG tablet Take 2,400-3,200 mg by mouth See admin instructions. Take 3 - 4 tablets (2400 mg -3200 mg) by mouth two times daily with meals    . oxyCODONE-acetaminophen (PERCOCET/ROXICET) 5-325 MG tablet Take 1-2 tablets by mouth every 6 (six) hours as needed. (Patient not taking: Reported on 10/25/2017) 10 tablet 0   Labs: Basic Metabolic Panel: Recent Labs  Lab 10/25/17 1800 10/26/17 0421  NA 130* 130*  K 4.6 5.4*  CL 89* 90*  CO2 20* 19*  GLUCOSE 189* 383*  BUN 19 28*  CREATININE 5.81* 6.96*  CALCIUM 8.8* 9.0   Liver Function Tests: Recent Labs  Lab 10/25/17 1800  AST 18  ALT 10*  ALKPHOS 114  BILITOT 2.0*  PROT 7.1  ALBUMIN 3.4*   CBC: Recent Labs  Lab 10/25/17 1800 10/26/17 0421  WBC 24.7* 25.2*  NEUTROABS 20.0*  --   HGB 10.3* 10.3*  HCT 31.3* 31.9*  MCV 94.3 95.5  PLT 186 198   CBG: Recent Labs  Lab 10/25/17 1928 10/26/17 0608  GLUCAP 186* 357*    Studies/Results: Dg Chest 2 View  Result Date: 10/25/2017 CLINICAL DATA:  Fever, diabetic wound for 1 day. EXAM: CHEST  2 VIEW COMPARISON:  07/27/2014. FINDINGS: Cardiomegaly. No consolidation or edema. No effusion or pneumothorax. Bones unremarkable. Compared with priors, dialysis catheter has been removed. IMPRESSION: Cardiomegaly.  No active disease. Electronically Signed   By: Staci Righter M.D.   On: 10/25/2017 19:33   Dg Foot Complete Right  Result Date: 10/25/2017 Please see detailed radiograph report in office note.   ROS: All others negative except those listed in HPI.   Physical Exam: Vitals:   10/25/17 2133 10/25/17 2300 10/26/17 0100 10/26/17 0617  BP:  (!) 154/86 139/77 (!) 182/85  Pulse:  94 94 98  Resp:    17  Temp: (!) 100.9 F (38.3 C)   99.2 F (37.3 C)  TempSrc: Oral   Oral  SpO2:  100% 98% 96%  Weight:      Height:         General: WDWN, NAD, pleasant male lethargic Head: NCAT, sclera not icteric, MMM DM retinopathy bilat Neck: Supple. PC lymphadenopathy. No JVD Lungs: CTAB. No wheeze, rales or rhonchi. Breathing is unlabored. Heart: RRR. 2/6 systolic murmur, no rubs or gallops. LV lift Abdomen: soft, nontender, +BS, no guarding, no rebound tenderness Liver down 5 cm M/S:  Equal strength b/l in upper and lower extremities.  Lower extremities: LLE: 1+DP/PT, no edema or ischemic changes, large callous over sub 5th MTJ left foot, no ulceration. RLE: 2+DP/PT, foot swollen, erythematous, ulceration w/surrounding callous at sub 5th MPJ, no drainage, black blister like area encircling base of great toe. Neuro: A&Ox3. Moves all extremities spontaneously. Psych:  Responds to questions appropriately with a normal affect. Dialysis Access: LU AVG +b/t  Dialysis Orders:  MWF  - [facility] [address]  4hrs, BFR 400, DFR 800,  EDW 75.5kg, 2K/ 2.25Ca  Access: LU AVG  Heparin 9000 Unit bolus  mircera 66mcg IV q2wks - last 1/23 Calcitriol 1.76mcg PO  qHD   Assessment/Plan: 1.  Sepsis 2/2 cellulitis of RLE, chronic diabetic ulcer: WBC trending up.  On ABX, wound care consulted. Per primaryNeeds Dopplers 2.  ESRD -  Completed HD yesterday. K 5.4, refusing HD today, kayexalate ordered. No emergent indications. Orders written for HD tomorrow.  3.  Hypertension/volume  - BP elevated, meds ordered. CXR showed no evidence of pulm edema.  Doubt accuracy of current weight, edw reached yesterday.  Typically does not have large gains. Standing weight ordered. Titrate down volume as tolerated. Variable adherence to bp meds 4.  Anemia  - Hgb 10.3, ESA just given. No indication at this time. Monitor.  5.  Secondary Hyperparathyroidism -  Ca in goal. Outpatient P elevated. Continue VDRA, binders, and sensipar.   6.  Nutrition - Renal diet with fluid restrictions.  7. Diabetes - on insulin, per primary 8. Severe gastrparesis and diabetic diarrhea.     Jen Mow, PA-C [name] Pager: [phone] 10/26/2017, 11:27 AM I have seen and examined this patient and agree with the plan of care seen, eval, examined, counseled patient .  [name] Brentlee Delage 10/26/2017, 4:30 PM

## 2017-10-26 NOTE — ED Notes (Signed)
Christy, SWOT, to transport pt.

## 2017-10-26 NOTE — Progress Notes (Signed)
Pt refused his renal diet ordered stating "I don't eat no renal diet at home; I eat whatever I want, what's the point for me to eat renal now and go back to eat how I do when I go home". MD notified and new order received. Will continue to closely monitor pt. Delia Heady RN

## 2017-10-26 NOTE — Progress Notes (Signed)
Inpatient Diabetes Program Recommendations  AACE/ADA: New Consensus Statement on Inpatient Glycemic Control (2015)  Target Ranges:  Prepandial:   less than 140 mg/dL      Peak postprandial:   less than 180 mg/dL (1-2 hours)      Critically ill patients:  140 - 180 mg/dL   Lab Results  Component Value Date   GLUCAP 357 (H) 10/26/2017   HGBA1C 6.2 (H) 10/26/2017   Review of Glycemic Control  Diabetes history: DM 2 Outpatient Diabetes medications: Lantus 5 units, Novolog 5 units BID with meals Current orders for Inpatient glycemic control: Lantus 6 units, Novolog 8 units tid with meals.  Inpatient Diabetes Program Recommendations:    A1c 6.2% on 10/26/17  Due to renal function and glucose levels, please consider increasing Lantus to 10 units (0.1 units/kg) and decrease meal coverage to 5 units BID with meals, since patient will hold onto Novolog longer.  Thanks,  Tama Headings RN, MSN, [facility] Inpatient Diabetes Coordinator Team Pager [phone] (8a-5p)

## 2017-10-26 NOTE — Progress Notes (Addendum)
[facility] PROGRESS NOTE  [name] PPJ:[id number] DOB: [birth year] DOA: 10/25/2017   PCP: Patient, No Pcp Per     Assessment/Plan: Principal Problem:   Sepsis ([place]) Active Problems:   Diabetes mellitus type 1 ([place])   ESRD (end stage renal disease) on dialysis ([place])   Essential (primary) hypertension   Cellulitis of right foot  37 y.o. male with medical history significant of DM type I, ESRD on HD, and diabetic foot ulcer followed by Dr. March [name]; who presents with complaints of right foot swelling. Patient was recently seen by his outpatient podiatrist Dr. Ruby [name] evidence, who sent him to the ED for IV antibiotics for possible cellulitis, no osteomyelitis seen on x-rays in the outpatient setting. Patient found a white count of 24.7   Assessment and plan Sepsis 2/2 Cellulitis of right lower extremity, diabetic ulcer: Patient presents with acute swelling and erythema of the right foot.  Initial to be tachycardic with fever up to 104.1 F, WBC 25.2, and lactic acid 2.03.  Sepsis protocol was initiated with empiric antibiotics of vancomycin and Zosyn.  Patient is followed in the outpatient setting by Dr. March [name].  - admit to telemetry - Follow-up blood cultures - Continue empiric antibiotics of vancomycin and Zosyn - Wound care consult - Repeat CBC in a.m.  - check ABI  Concern for STD:  - Follow-up HIV and G/C probe  - Continue to advise on safe sex practices.  ESRD on HD:  Patient normally dialyzes M/W/F    Initial potassium 4.6, BUN 19, and creatinine 5.81. - Continue sensipar - Dr. Jimmy [name] consulted, follow-up for further recommendations  Diabetes mellitus type 1: Patient's last hemoglobin A1c noted to be 5.8 in 07/2014. - Hypoglycemic protocols - hemoglobin A1c 6.2 - continue Lantus/NovoLog and add  sliding scale insulin - Adjust insulin regimen as needed  Essential hypertension - Continue amlodipine, Coreg, losartan, and pharmacy substitution of  irbesartan   Anxiety - Continue Klonopin twice a day prn  Intractable nausea Previous history of gastritis, will start patient on a PPI Possibly could also have underlying gastroparesis. Patient on Reglan     DVT prophylaxsis heparin  Code Status:  Full code     Family Communication: Discussed in detail with the patient, all imaging results, lab results explained to the patient   Disposition Plan:  1-2 days     Consultants:  none  Procedures:  none  Antibiotics: Anti-infectives (From admission, onward)   Start     Dose/Rate Route Frequency Ordered Stop   10/27/17 1200  vancomycin (VANCOCIN) IVPB 1000 mg/200 mL premix     1,000 mg 200 mL/hr over 60 Minutes Intravenous Every M-W-F (Hemodialysis) 10/25/17 2120     10/26/17 0800  piperacillin-tazobactam (ZOSYN) IVPB 3.375 g     3.375 g 12.5 mL/hr over 240 Minutes Intravenous Every 12 hours 10/25/17 2117     10/25/17 2015  piperacillin-tazobactam (ZOSYN) IVPB 3.375 g     3.375 g 100 mL/hr over 30 Minutes Intravenous  Once 10/25/17 2012 10/25/17 2100   10/25/17 2015  vancomycin (VANCOCIN) IVPB 1000 mg/200 mL premix     1,000 mg 200 mL/hr over 60 Minutes Intravenous  Once 10/25/17 2012 10/25/17 2126         HPI/Subjective: Complaints of intractable nausea and vomiting  Objective: Vitals:   10/25/17 2300 10/26/17 0100 10/26/17 0617 10/26/17 1334  BP: (!) 154/86 139/77 (!) 182/85 (!) 165/70  Pulse: 94 94 98 99  Resp:  17 18  Temp:   99.2 F (37.3 C) 98.5 F (36.9 C)  TempSrc:   Oral Oral  SpO2: 100% 98% 96% 99%  Weight:      Height:        Intake/Output Summary (Last 24 hours) at 10/26/2017 1409 Last data filed at 10/26/2017 1306 Gross per 24 hour  Intake 250 ml  Output 3 ml  Net 247 ml    Exam:  Examination:  General exam: Appears calm and comfortable  Respiratory system: Clear to auscultation. Respiratory effort normal. Cardiovascular system: S1 & S2 heard, RRR. No JVD, murmurs, rubs,  gallops or clicks. No pedal edema. Gastrointestinal system: Abdomen is nondistended, soft and nontender. No organomegaly or masses felt. Normal bowel sounds heard. Central nervous system: Alert and oriented. No focal neurological deficits. Extremities: Symmetric 5 x 5 power. Skin: 2 cm ulceration of the lateral aspect of the right foot with erythema of the dorsal aspect between the first and second digit. Psychiatry: Judgement and insight appear normal. Mood & affect appropriate.     Data Reviewed: I have personally reviewed following labs and imaging studies  Micro Results Recent Results (from the past 240 hour(s))  Culture, blood (Routine x 2)     Status: None (Preliminary result)   Collection Time: 10/25/17  6:00 PM  Result Value Ref Range Status   Specimen Description BLOOD BLOOD LEFT FOREARM  Final   Special Requests   Final    BOTTLES DRAWN AEROBIC AND ANAEROBIC Blood Culture adequate volume   Culture NO GROWTH < 24 HOURS  Final   Report Status PENDING  Incomplete  Culture, blood (Routine x 2)     Status: None (Preliminary result)   Collection Time: 10/25/17  7:45 PM  Result Value Ref Range Status   Specimen Description BLOOD LEFT HAND  Final   Special Requests IN PEDIATRIC BOTTLE Blood Culture adequate volume  Final   Culture NO GROWTH < 12 HOURS  Final   Report Status PENDING  Incomplete    Radiology Reports Dg Chest 2 View  Result Date: 10/25/2017 CLINICAL DATA:  Fever, diabetic wound for 1 day. EXAM: CHEST  2 VIEW COMPARISON:  07/27/2014. FINDINGS: Cardiomegaly. No consolidation or edema. No effusion or pneumothorax. Bones unremarkable. Compared with priors, dialysis catheter has been removed. IMPRESSION: Cardiomegaly.  No active disease. Electronically Signed   By: [name] M.D.   On: 10/25/2017 19:33   Dg Foot Complete Right  Result Date: 10/25/2017 Please see detailed radiograph report in office note.    CBC Recent Labs  Lab 10/25/17 1800 10/26/17 0421   WBC 24.7* 25.2*  HGB 10.3* 10.3*  HCT 31.3* 31.9*  PLT 186 198  MCV 94.3 95.5  MCH 31.0 30.8  MCHC 32.9 32.3  RDW 14.4 14.4  LYMPHSABS 1.7  --   MONOABS 3.0*  --   EOSABS 0.0  --   BASOSABS 0.0  --     Chemistries  Recent Labs  Lab 10/25/17 1800 10/26/17 0421  NA 130* 130*  K 4.6 5.4*  CL 89* 90*  CO2 20* 19*  GLUCOSE 189* 383*  BUN 19 28*  CREATININE 5.81* 6.96*  CALCIUM 8.8* 9.0  AST 18  --   ALT 10*  --   ALKPHOS 114  --   BILITOT 2.0*  --    ------------------------------------------------------------------------------------------------------------------ estimated creatinine clearance is 15.2 mL/min (A) (by C-G formula based on SCr of 6.96 mg/dL (H)). ------------------------------------------------------------------------------------------------------------------ Recent Labs    10/26/17 0421  HGBA1C  6.2*   ------------------------------------------------------------------------------------------------------------------ No results for input(s): CHOL, HDL, LDLCALC, TRIG, CHOLHDL, LDLDIRECT in the last 72 hours. ------------------------------------------------------------------------------------------------------------------ No results for input(s): TSH, T4TOTAL, T3FREE, THYROIDAB in the last 72 hours.  Invalid input(s): FREET3 ------------------------------------------------------------------------------------------------------------------ No results for input(s): VITAMINB12, FOLATE, FERRITIN, TIBC, IRON, RETICCTPCT in the last 72 hours.  Coagulation profile Recent Labs  Lab 10/25/17 1800  INR 1.16    No results for input(s): DDIMER in the last 72 hours.  Cardiac Enzymes No results for input(s): CKMB, TROPONINI, MYOGLOBIN in the last 168 hours.  Invalid input(s): CK ------------------------------------------------------------------------------------------------------------------ Invalid input(s): POCBNP   CBG: Recent Labs  Lab 10/25/17 1928  10/26/17 0608 10/26/17 1229  GLUCAP 186* 357* 312*       Studies: Dg Chest 2 View  Result Date: 10/25/2017 CLINICAL DATA:  Fever, diabetic wound for 1 day. EXAM: CHEST  2 VIEW COMPARISON:  07/27/2014. FINDINGS: Cardiomegaly. No consolidation or edema. No effusion or pneumothorax. Bones unremarkable. Compared with priors, dialysis catheter has been removed. IMPRESSION: Cardiomegaly.  No active disease. Electronically Signed   By: [name] M.D.   On: 10/25/2017 19:33   Dg Foot Complete Right  Result Date: 10/25/2017 Please see detailed radiograph report in office note.     Lab Results  Component Value Date   HGBA1C 6.2 (H) 10/26/2017   HGBA1C 5.8 08/22/2014   HGBA1C 7.1 (H) 06/28/2014   Lab Results  Component Value Date   CREATININE 6.96 (H) 10/26/2017       Scheduled Meds: . amLODipine  10 mg Oral Daily  . cinacalcet  30 mg Oral Q supper  . heparin  5,000 Units Subcutaneous Q8H  . insulin aspart  8 Units Subcutaneous TID AC  . insulin glargine  6 Units Subcutaneous QHS  . losartan  50 mg Oral Daily  . sevelamer carbonate  3,200-4,000 mg Oral TID WC  . sodium polystyrene  30 g Oral Once   Continuous Infusions: . piperacillin-tazobactam (ZOSYN)  IV Stopped (10/26/17 1307)  . [START ON 10/27/2017] vancomycin       LOS: 1 day    Time spent: >30 MINS    [name]  [facility] Pager [phone]. If 7PM-7AM, please contact night-coverage at [email or web address], password [facility] 10/26/2017, 2:09 PM  LOS: 1 day

## 2017-10-26 NOTE — ED Notes (Signed)
Changed pt's bed linen x 2 as requested d/t soiled by pt's wound on right foot. Wound cleansed and bandaged w/telfa and gauze.

## 2017-10-26 NOTE — Progress Notes (Signed)
Pt admitted to the unit from ED; pt A&O x4, pt oriented to the unit and room; fall/safety precaution and prevention education completed. Pt has RLE ulcer which has dry gauze dsg intact to it; WOC consulted; pt MAE x4; enteric precaution initiated; pt in bed with call light within reach; vitals in; will closely monitor pt. Delia Heady RN

## 2017-10-26 NOTE — ED Notes (Signed)
Admitting MD aware pt c/o nausea continues and was given Zofran around 0600 - also pt w/diarrhea stools x 2 - partial incont. States he has been experiencing incont stool while sleeping at home. Orders received. Pt aware will need to collect next stool. Pt requested RN to "tuck me in real good" each time he has returned to his room from bathroom.

## 2017-10-26 NOTE — Consult Note (Signed)
[place] nurse reviewed notes See below, patient was seen in podiatry office yesterday  Dermatology: Ulcer noted to the plantar aspect of the sub-fifth MPJ right foot measuring approximately 1.5 x 1.5 x 0.3 cm.  Minimal drainage noted.  There is no eschar.  There is a moderate amount of slough fibrin necrotic tissue noted.  Currently there does not appear to be any exposed bone muscle tendon ligament or joint.  No malodor noted.  Periwound integrity is extremely callused. There is some bullous blister lesions encompassing the great toe and first webspace to the right foot.  Blisters are intact.  There is also some red discoloration noted superficially.  Vascular: Palpable pedal pulses bilaterally.  The right foot ankle and leg is extremely hot compared to the contralateral limb consistent with an acute cellulitis.  Moderate edema noted to the right foot and ankle.   Superficial neuropathic foot ulceration on the plantar surface fo the right foot along with bullous area in between the great toe and the 2nd toe, intact not draining. xrays are negative   Notes indicated that Dr. March Rummage from podiatry was to be consulted. I have not see that note as of right now. Will addressing topical wound care for now until podiatry evaluation.    No podietry consultation at this, reviewed images. Xrays are negative for osteomyelitis  Right lateral plantar surface wound at 5th metatarsal head, dry, pale, pink, non granular with hyperkeratosis of wound edges, presents typical neuropathic foot ulceration. Will add hydrogel to maintain moisture, however will need serial debridements with offloading of the ulceration. Could benefit from TCC for forced compliance with offloading, however now with new area between the first and second toe webspace, serous filled bulla but not open. Will add xeroform here to as non adherent and for antibacterial effects.  If desired would place patient in custom offloading boot per advanced  prosthetics   Discussed POC with patient and bedside nurse.  Re consult if needed, will not follow at this time. Thanks  Makaylyn Sinyard [employer], RN,CWOCN, CNS, [place] [phone])

## 2017-10-27 ENCOUNTER — Inpatient Hospital Stay (HOSPITAL_COMMUNITY): Payer: Medicare Other

## 2017-10-27 DIAGNOSIS — L02612 Cutaneous abscess of left foot: Secondary | ICD-10-CM

## 2017-10-27 DIAGNOSIS — L02611 Cutaneous abscess of right foot: Secondary | ICD-10-CM

## 2017-10-27 DIAGNOSIS — L03031 Cellulitis of right toe: Secondary | ICD-10-CM

## 2017-10-27 DIAGNOSIS — L0390 Cellulitis, unspecified: Secondary | ICD-10-CM

## 2017-10-27 DIAGNOSIS — L089 Local infection of the skin and subcutaneous tissue, unspecified: Secondary | ICD-10-CM

## 2017-10-27 DIAGNOSIS — L03115 Cellulitis of right lower limb: Secondary | ICD-10-CM

## 2017-10-27 DIAGNOSIS — A419 Sepsis, unspecified organism: Principal | ICD-10-CM

## 2017-10-27 DIAGNOSIS — E11628 Type 2 diabetes mellitus with other skin complications: Secondary | ICD-10-CM

## 2017-10-27 DIAGNOSIS — L03032 Cellulitis of left toe: Secondary | ICD-10-CM

## 2017-10-27 LAB — CBC
HCT: 30.6 % — ABNORMAL LOW (ref 39.0–52.0)
Hemoglobin: 10.1 g/dL — ABNORMAL LOW (ref 13.0–17.0)
MCH: 30.9 pg (ref 26.0–34.0)
MCHC: 33 g/dL (ref 30.0–36.0)
MCV: 93.6 fL (ref 78.0–100.0)
Platelets: 213 10*3/uL (ref 150–400)
RBC: 3.27 MIL/uL — ABNORMAL LOW (ref 4.22–5.81)
RDW: 13.8 % (ref 11.5–15.5)
WBC: 24.8 10*3/uL — ABNORMAL HIGH (ref 4.0–10.5)

## 2017-10-27 LAB — BASIC METABOLIC PANEL
Anion gap: 20 — ABNORMAL HIGH (ref 5–15)
BUN: 36 mg/dL — ABNORMAL HIGH (ref 6–20)
CO2: 17 mmol/L — ABNORMAL LOW (ref 22–32)
Calcium: 9.3 mg/dL (ref 8.9–10.3)
Chloride: 91 mmol/L — ABNORMAL LOW (ref 101–111)
Creatinine, Ser: 8.74 mg/dL — ABNORMAL HIGH (ref 0.61–1.24)
GFR calc Af Amer: 8 mL/min — ABNORMAL LOW (ref >60)
GFR calc non Af Amer: 7 mL/min — ABNORMAL LOW (ref >60)
Glucose, Bld: 352 mg/dL — ABNORMAL HIGH (ref 65–99)
Potassium: 4.8 mmol/L (ref 3.5–5.1)
Sodium: 128 mmol/L — ABNORMAL LOW (ref 135–145)

## 2017-10-27 LAB — GASTROINTESTINAL PANEL BY PCR, STOOL (REPLACES STOOL CULTURE)

## 2017-10-27 LAB — GLUCOSE, CAPILLARY
Glucose-Capillary: 478 mg/dL — ABNORMAL HIGH (ref 65–99)
Glucose-Capillary: 277 mg/dL — ABNORMAL HIGH (ref 65–99)
Glucose-Capillary: 364 mg/dL — ABNORMAL HIGH (ref 65–99)
Glucose-Capillary: 257 mg/dL — ABNORMAL HIGH (ref 65–99)

## 2017-10-27 LAB — MAGNESIUM: Magnesium: 2.3 mg/dL (ref 1.7–2.4)

## 2017-10-27 LAB — MRSA PCR SCREENING: MRSA by PCR: INVALID — AB

## 2017-10-27 LAB — PHOSPHORUS: Phosphorus: 5.3 mg/dL — ABNORMAL HIGH (ref 2.5–4.6)

## 2017-10-27 MED ORDER — METOCLOPRAMIDE HCL 5 MG/ML IJ SOLN
5.0000 mg | Freq: Three times a day (TID) | INTRAMUSCULAR | Status: DC
  Administered 2017-10-27 – 2017-11-02 (×16): 5 mg via INTRAVENOUS
  Filled 2017-10-27 (×16): qty 2

## 2017-10-27 MED ORDER — PANTOPRAZOLE SODIUM 40 MG PO TBEC
40.0000 mg | DELAYED_RELEASE_TABLET | Freq: Every day | ORAL | Status: DC
  Administered 2017-10-27 – 2017-11-02 (×7): 40 mg via ORAL
  Filled 2017-10-27 (×7): qty 1

## 2017-10-27 MED ORDER — VANCOMYCIN HCL IN DEXTROSE 750-5 MG/150ML-% IV SOLN
750.0000 mg | INTRAVENOUS | Status: DC
Start: 2017-10-29 — End: 2017-10-30
  Administered 2017-10-29 (×2): 750 mg via INTRAVENOUS
  Filled 2017-10-27 (×2): qty 150

## 2017-10-27 MED ORDER — INSULIN GLARGINE 100 UNIT/ML ~~LOC~~ SOLN
12.0000 [IU] | Freq: Every day | SUBCUTANEOUS | Status: DC
  Administered 2017-10-28: 12 [IU] via SUBCUTANEOUS
  Filled 2017-10-27: qty 0.12

## 2017-10-27 MED ORDER — INSULIN ASPART 100 UNIT/ML ~~LOC~~ SOLN
10.0000 [IU] | Freq: Once | SUBCUTANEOUS | Status: AC
  Administered 2017-10-27: 10 [IU] via SUBCUTANEOUS

## 2017-10-27 MED ORDER — ONDANSETRON HCL 4 MG/2ML IJ SOLN
INTRAMUSCULAR | Status: AC
  Filled 2017-10-27: qty 2

## 2017-10-27 NOTE — Progress Notes (Signed)
PROGRESS NOTE    [name]  TOI:[id number] DOB: 01-27-1981 DOA: 10/25/2017 PCP: [name], No Pcp Per     Brief Narrative:  [name] is a 37 y.o. male with medical history significant of DM type I, ESRD on HD, and diabetic foot ulcer followed by [name]; who presents with complaints of right foot swelling.  During hemodialysis, it was noticed that he had swelling and red discoloration of his right foot.  He states that he has no feeling in his feet, and did not noticed the symptoms until they pointed out.  He does not recall any recent trauma or injury to his foot.  [name] has a known ulceration of the lateral aspect of the right foot that he states has been there for years.  He had just recently reestablished care and have been being followed by [name] of podiatry.  He was made a urgent appointment at his podiatrist's office and was seen by Dr. Daylene [name].  X-rays were obtained which showed no signs of osseous involvement, but he was sent to the emergency department for need of IV antibiotics.    Assessment & Plan:   Principal Problem:   Sepsis ([place]) Active Problems:   Diabetes mellitus type 1 ([place])   ESRD (end stage renal disease) on dialysis ([place])   Essential (primary) hypertension   Cellulitis of right foot   Sepsis 2/2cellulitis ofright foot,diabetic ulcer -On presentation, tachycardic with fever up to 104.1 F, WBC 25.2, and lactic acid 2.03 -Continue empiric antibiotics of vancomycin and Zosyn -Wound care consulted -ABI unable to obtain due to non-compressible arteries likely due to calcification  -Consult [name] today   ESRD on HD -Nephrology consulted  Concern for STD -HIV NR  -GC/Chlamydia negative   Diabetes mellitus type1 -Ha1c 6.2  -Continue Lantus/NovoLog and sliding scale insulin  Essential hypertension -Continue amlodipine, losartan  Anxiety -Continue Klonopintwice a day prn  Intractable nausea -Improved continue reglan, PPI    Diarrhea -After receiving kayexalate. GI PCR negative.    DVT prophylaxis: subq hep Code Status: Full Family Communication: No family at bedside Disposition Plan: No family at bedside   Consultants:   Podiatry  Procedures:   None   Antimicrobials:  Anti-infectives (From admission, onward)   Start     Dose/Rate Route Frequency Ordered Stop   10/29/17 1200  vancomycin (VANCOCIN) IVPB 750 mg/150 ml premix     750 mg 150 mL/hr over 60 Minutes Intravenous Every M-W-F (Hemodialysis) 10/27/17 1158     10/27/17 1200  vancomycin (VANCOCIN) IVPB 1000 mg/200 mL premix  Status:  Discontinued     1,000 mg 200 mL/hr over 60 Minutes Intravenous Every M-W-F (Hemodialysis) 10/25/17 2120 10/27/17 1158   10/26/17 0800  piperacillin-tazobactam (ZOSYN) IVPB 3.375 g     3.375 g 12.5 mL/hr over 240 Minutes Intravenous Every 12 hours 10/25/17 2117     10/25/17 2015  piperacillin-tazobactam (ZOSYN) IVPB 3.375 g     3.375 g 100 mL/hr over 30 Minutes Intravenous  Once 10/25/17 2012 10/25/17 2100   10/25/17 2015  vancomycin (VANCOCIN) IVPB 1000 mg/200 mL premix     1,000 mg 200 mL/hr over 60 Minutes Intravenous  Once 10/25/17 2012 10/25/17 2126       Subjective: Upset about IV continuing to beep as well as various complaints regarding staff. No physical concerns. Diarrhea improving.   Objective: Vitals:   10/27/17 1130 10/27/17 1200 10/27/17 1213 10/27/17 1300  BP: 128/72 126/68 136/73 129/68  Pulse: 98  99 98 (!) 109  Resp: 17 18 18 18   Temp:   98.7 F (37.1 C) 99 F (37.2 C)  TempSrc:   Oral Oral  SpO2:   95% 100%  Weight:   71 kg (156 lb 8.4 oz)   Height:        Intake/Output Summary (Last 24 hours) at 10/27/2017 1543 Last data filed at 10/27/2017 1400 Gross per 24 hour  Intake 460 ml  Output 3500 ml  Net -3040 ml   Filed Weights   10/26/17 1516 10/27/17 0755 10/27/17 1213  Weight: 75.4 kg (166 lb 3.6 oz) 74.5 kg (164 lb 3.9 oz) 71 kg (156 lb 8.4 oz)    Examination:   General exam: Appears calm and comfortable  Respiratory system: Clear to auscultation. Respiratory effort normal. Cardiovascular system: S1 & S2 heard, RRR. No JVD, murmurs, rubs, gallops or clicks. No pedal edema. Gastrointestinal system: Abdomen is nondistended, soft and nontender. No organomegaly or masses felt. Normal bowel sounds heard. Central nervous system: Alert and oriented. No focal neurological deficits. Extremities: Symmetric 5 x 5 power. Skin: +right plantar lateral aspect with open wound about 2cm in diameter, foot with edematous with blisters around first toe Psychiatry: Judgement and insight appear normal. Mood & affect appropriate.   Data Reviewed: I have personally reviewed following labs and imaging studies  CBC: Recent Labs  Lab 10/25/17 1800 10/26/17 0421 10/27/17 0255  WBC 24.7* 25.2* 24.8*  NEUTROABS 20.0*  --   --   HGB 10.3* 10.3* 10.1*  HCT 31.3* 31.9* 30.6*  MCV 94.3 95.5 93.6  PLT 186 198 497   Basic Metabolic Panel: Recent Labs  Lab 10/25/17 1800 10/26/17 0421 10/27/17 0255  NA 130* 130* 128*  K 4.6 5.4* 4.8  CL 89* 90* 91*  CO2 20* 19* 17*  GLUCOSE 189* 383* 352*  BUN 19 28* 36*  CREATININE 5.81* 6.96* 8.74*  CALCIUM 8.8* 9.0 9.3  MG  --   --  2.3  PHOS  --   --  5.3*   GFR: Estimated Creatinine Clearance: 10.3 mL/min (A) (by C-G formula based on SCr of 8.74 mg/dL (H)). Liver Function Tests: Recent Labs  Lab 10/25/17 1800  AST 18  ALT 10*  ALKPHOS 114  BILITOT 2.0*  PROT 7.1  ALBUMIN 3.4*   No results for input(s): LIPASE, AMYLASE in the last 168 hours. No results for input(s): AMMONIA in the last 168 hours. Coagulation Profile: Recent Labs  Lab 10/25/17 1800  INR 1.16   Cardiac Enzymes: No results for input(s): CKTOTAL, CKMB, CKMBINDEX, TROPONINI in the last 168 hours. BNP (last 3 results) No results for input(s): PROBNP in the last 8760 hours. HbA1C: Recent Labs    10/26/17 0421  HGBA1C 6.2*   CBG: Recent Labs   Lab 10/26/17 1229 10/26/17 1554 10/26/17 2136 10/27/17 0602 10/27/17 1258  GLUCAP 312* 218* 319* 478* 277*   Lipid Profile: No results for input(s): CHOL, HDL, LDLCALC, TRIG, CHOLHDL, LDLDIRECT in the last 72 hours. Thyroid Function Tests: No results for input(s): TSH, T4TOTAL, FREET4, T3FREE, THYROIDAB in the last 72 hours. Anemia Panel: No results for input(s): VITAMINB12, FOLATE, FERRITIN, TIBC, IRON, RETICCTPCT in the last 72 hours. Sepsis Labs: Recent Labs  Lab 10/25/17 1814 10/25/17 1958  LATICACIDVEN 2.03* 1.27    Recent Results (from the past 240 hour(s))  Culture, blood (Routine x 2)     Status: None (Preliminary result)   Collection Time: 10/25/17  6:00 PM  Result Value Ref Range  Status   Specimen Description BLOOD BLOOD LEFT FOREARM  Final   Special Requests   Final    BOTTLES DRAWN AEROBIC AND ANAEROBIC Blood Culture adequate volume   Culture NO GROWTH 2 DAYS  Final   Report Status PENDING  Incomplete  Culture, blood (Routine x 2)     Status: None (Preliminary result)   Collection Time: 10/25/17  7:45 PM  Result Value Ref Range Status   Specimen Description BLOOD LEFT HAND  Final   Special Requests IN PEDIATRIC BOTTLE Blood Culture adequate volume  Final   Culture NO GROWTH 2 DAYS  Final   Report Status PENDING  Incomplete  Gastrointestinal Panel by PCR , Stool     Status: None   Collection Time: 10/26/17  1:00 PM  Result Value Ref Range Status   Campylobacter species NOT DETECTED NOT DETECTED Final   Plesimonas shigelloides NOT DETECTED NOT DETECTED Final   Salmonella species NOT DETECTED NOT DETECTED Final   Yersinia enterocolitica NOT DETECTED NOT DETECTED Final   Vibrio species NOT DETECTED NOT DETECTED Final   Vibrio cholerae NOT DETECTED NOT DETECTED Final   Enteroaggregative E coli (EAEC) NOT DETECTED NOT DETECTED Final   Enteropathogenic E coli (EPEC) NOT DETECTED NOT DETECTED Final   Enterotoxigenic E coli (ETEC) NOT DETECTED NOT DETECTED Final    Shiga like toxin producing E coli (STEC) NOT DETECTED NOT DETECTED Final   Shigella/Enteroinvasive E coli (EIEC) NOT DETECTED NOT DETECTED Final   Cryptosporidium NOT DETECTED NOT DETECTED Final   Cyclospora cayetanensis NOT DETECTED NOT DETECTED Final   Entamoeba histolytica NOT DETECTED NOT DETECTED Final   Giardia lamblia NOT DETECTED NOT DETECTED Final   Adenovirus F40/41 NOT DETECTED NOT DETECTED Final   Astrovirus NOT DETECTED NOT DETECTED Final   Norovirus GI/GII NOT DETECTED NOT DETECTED Final   Rotavirus A NOT DETECTED NOT DETECTED Final   Sapovirus (I, II, IV, and V) NOT DETECTED NOT DETECTED Final    Comment: Performed at [facility], [address] [place], [place] [address]  MRSA PCR Screening     Status: Abnormal   Collection Time: 10/26/17  5:22 PM  Result Value Ref Range Status   MRSA by PCR INVALID RESULTS, SPECIMEN SENT FOR CULTURE (A) NEGATIVE Final    Comment:        The GeneXpert MRSA Assay (FDA approved for NASAL specimens only), is one component of a comprehensive MRSA colonization surveillance program. It is not intended to diagnose MRSA infection nor to guide or monitor treatment for MRSA infections. RESULT CALLED TO, READ BACK BY AND VERIFIED WITH: RN Nona Dell [id number] [phone]        Radiology Studies: Dg Chest 2 View  Result Date: 10/25/2017 CLINICAL DATA:  Fever, diabetic wound for 1 day. EXAM: CHEST  2 VIEW COMPARISON:  07/27/2014. FINDINGS: Cardiomegaly. No consolidation or edema. No effusion or pneumothorax. Bones unremarkable. Compared with priors, dialysis catheter has been removed. IMPRESSION: Cardiomegaly.  No active disease. Electronically Signed   By: Staci Righter M.D.   On: 10/25/2017 19:33   Dg Foot Complete Right  Result Date: 10/27/2017 CLINICAL DATA:  Right foot infection. Pt. Says sores are on lateral 5th metatarsal and under heel. Hx of diabetes mellitus. EXAM: RIGHT FOOT COMPLETE - 3+ VIEW COMPARISON:  None. FINDINGS:  Soft tissue lucency/irregularity lateral to the fifth MTP joint, compatible with the given history of sore. Adjacent osseous structures appear intact and normal in mineralization. No destructive changes to suggest osteomyelitis.  No fracture line or displaced fracture fragment seen. Vascular calcifications seen throughout the foot. IMPRESSION: 1. Soft tissue lucency lateral to the fifth MTP joint, consistent with the given history of soft tissue infection/sore. 2. Adjacent osseous structures appear normal. No evidence of osteomyelitis. Electronically Signed   By: Franki Cabot M.D.   On: 10/27/2017 00:37   Dg Foot Complete Right  Result Date: 10/25/2017 Please see detailed radiograph report in office note.     Scheduled Meds: . amLODipine  10 mg Oral QHS  . cinacalcet  30 mg Oral Q supper  . heparin  5,000 Units Subcutaneous Q8H  . insulin aspart  0-9 Units Subcutaneous TID WC  . insulin aspart  5 Units Subcutaneous TID AC  . insulin glargine  12 Units Subcutaneous QHS  . losartan  50 mg Oral QHS  . metoCLOPramide (REGLAN) injection  5 mg Intravenous Q8H  . multivitamin  1 tablet Oral QHS  . pantoprazole  40 mg Oral Daily  . sevelamer carbonate  3,200-4,000 mg Oral TID WC   Continuous Infusions: . piperacillin-tazobactam (ZOSYN)  IV 3.375 g (10/27/17 1327)  . [START ON 10/29/2017] vancomycin       LOS: 2 days    Time spent: 40 minutes   Dessa Phi, DO [facility] [email or web address] Password [facility] 10/27/2017, 3:43 PM

## 2017-10-27 NOTE — Progress Notes (Signed)
ABI's have been completed. Right Unable to obtain ABI's due to non-compressible arteries likely due to medial calcification.  Left Unable to obtain ABI's due to non-compressible arteries likely due to medial calcification.  10/27/17 2:57 PM [name] RVT

## 2017-10-27 NOTE — Progress Notes (Signed)
Pharmacy Antibiotic Note  [name] is a 37 y.o. male admitted on 10/25/2017 with cellulitis.  Pharmacy has been consulted for vancomycin and Zosyn dosing. Pt is ESRD on dialysis (MWF).  The patient received a 1500 mg loading dose at HD prior to arrival at [facility] on 1/28 and an additional 1g was ordered by the ED provider. Due to this - will plan to hold Vancomycin with HD on 1/30 and resume dosing on 2/1.   Plan: - Hold Vancomycin with HD on 1/30 - Resume Vancomycin 750 mg/HD-MWF starting on 2/1 - Continue Zosyn 3.375g IV every 12 hours (infused over 4 hours) - Will continue to follow HD schedule/duration, culture results, LOT, and antibiotic de-escalation plans   Height: 5\' 6"  (167.6 cm) Weight: (P) 164 lb 3.9 oz (74.5 kg) IBW/kg (Calculated) : 63.8  Temp (24hrs), Avg:99 F (37.2 C), Min:97.7 F (36.5 C), Max:100.7 F (38.2 C)  Recent Labs  Lab 10/25/17 1800 10/25/17 1814 10/25/17 1958 10/26/17 0421 10/27/17 0255  WBC 24.7*  --   --  25.2* 24.8*  CREATININE 5.81*  --   --  6.96* 8.74*  LATICACIDVEN  --  2.03* 1.27  --   --     Estimated Creatinine Clearance: 10.3 mL/min (A) (by C-G formula based on SCr of 8.74 mg/dL (H)).    Allergies  Allergen Reactions  . No Known Allergies     Antimicrobials this admission: Vanc 1/28 >> Zosyn 1/28 >>  Microbiology results: 1/28 BCx >> ngtd 1/29 MRSA PCR >> invalid  Thank you for allowing pharmacy to be a part of this patient's care.  Alycia Rossetti, PharmD, BCPS Clinical Pharmacist Pager: [phone] Clinical phone for 10/27/2017 from 7a-3:30p: [phone] If after 3:30p, please call main pharmacy at: [phone] 10/27/2017 12:10 PM

## 2017-10-27 NOTE — Procedures (Signed)
I have personally attended this patient's dialysis session. Pre weight 74.5  ? accuracy (under EDW) UFG 3.5 Will get accurate standing weight post HD 2K bath (K 4.8) BP stable  Jamal Maes, MD [place] Pager 10/27/2017, 11:24 AM

## 2017-10-27 NOTE — Progress Notes (Signed)
[facility] Rounding Note  Subjective/Interval History:  Seen in HD Upset about renal diet order but otherwise in good spirits  Objective Vital signs in last 24 hours: Vitals:   10/27/17 0813 10/27/17 0818 10/27/17 0900 10/27/17 0930  BP: (!) 168/94 (!) 157/82 (!) 165/85 (!) (P) 155/85  Pulse: 91 88 93 (P) 94  Resp: 17 18 18  (P) 19  Temp:      TempSrc:      SpO2:      Weight:      Height:       Weight change: -15.319 kg (-12.4 oz)  Intake/Output Summary (Last 24 hours) at 10/27/2017 1059 Last data filed at 10/27/2017 0300 Gross per 24 hour  Intake 100 ml  Output 1 ml  Net 99 ml   Physical Exam:  Blood pressure (!) (P) 155/85, pulse (P) 94, temperature (P) 97.8 F (36.6 C), temperature source (P) Oral, resp. rate (P) 19, height 5\' 6"  (1.676 m), weight (P) 74.5 kg (164 lb 3.9 oz), SpO2 (P) 98 %.   Very nice young man Seen in HD VS as noted No rashes, many tattoos AVF R cannulated for HD Normal heart sounds S1S2 No S3 Abd soft and not tender Foot wrapped so wounds not examined No LE edema, RLE warm vs left Pedal pulses palpable   Recent Labs  Lab 10/25/17 1800 10/26/17 0421 10/27/17 0255  NA 130* 130* 128*  K 4.6 5.4* 4.8  CL 89* 90* 91*  CO2 20* 19* 17*  GLUCOSE 189* 383* 352*  BUN 19 28* 36*  CREATININE 5.81* 6.96* 8.74*  CALCIUM 8.8* 9.0 9.3  PHOS  --   --  5.3*   Recent Labs  Lab 10/25/17 1800  AST 18  ALT 10*  ALKPHOS 114  BILITOT 2.0*  PROT 7.1  ALBUMIN 3.4*    Recent Labs  Lab 10/25/17 1800 10/26/17 0421 10/27/17 0255  WBC 24.7* 25.2* 24.8*  NEUTROABS 20.0*  --   --   HGB 10.3* 10.3* 10.1*  HCT 31.3* 31.9* 30.6*  MCV 94.3 95.5 93.6  PLT 186 198 213    Recent Labs  Lab 10/26/17 0608 10/26/17 1229 10/26/17 1554 10/26/17 2136 10/27/17 0602  GLUCAP 357* 312* 218* 319* 478*   Studies/Results: Dg Chest 2 View  Result Date: 10/25/2017 CLINICAL DATA:  Fever, diabetic wound for 1 day. EXAM: CHEST  2 VIEW COMPARISON:  07/27/2014.  FINDINGS: Cardiomegaly. No consolidation or edema. No effusion or pneumothorax. Bones unremarkable. Compared with priors, dialysis catheter has been removed. IMPRESSION: Cardiomegaly.  No active disease. Electronically Signed   By: Staci Righter M.D.   On: 10/25/2017 19:33   Dg Foot Complete Right  Result Date: 10/27/2017 CLINICAL DATA:  Right foot infection. Pt. Says sores are on lateral 5th metatarsal and under heel. Hx of diabetes mellitus. EXAM: RIGHT FOOT COMPLETE - 3+ VIEW COMPARISON:  None. FINDINGS: Soft tissue lucency/irregularity lateral to the fifth MTP joint, compatible with the given history of sore. Adjacent osseous structures appear intact and normal in mineralization. No destructive changes to suggest osteomyelitis. No fracture line or displaced fracture fragment seen. Vascular calcifications seen throughout the foot. IMPRESSION: 1. Soft tissue lucency lateral to the fifth MTP joint, consistent with the given history of soft tissue infection/sore. 2. Adjacent osseous structures appear normal. No evidence of osteomyelitis. Electronically Signed   By: Franki Cabot M.D.   On: 10/27/2017 00:37   Dg Foot Complete Right  Result Date: 10/25/2017 Please see detailed radiograph report in office  note.  Medications: . piperacillin-tazobactam (ZOSYN)  IV Stopped (10/27/17 0050)  . vancomycin     . amLODipine  10 mg Oral QHS  . cinacalcet  30 mg Oral Q supper  . heparin  5,000 Units Subcutaneous Q8H  . insulin aspart  0-9 Units Subcutaneous TID WC  . insulin aspart  5 Units Subcutaneous TID AC  . insulin glargine  12 Units Subcutaneous QHS  . losartan  50 mg Oral QHS  . metoCLOPramide (REGLAN) injection  10 mg Intravenous Q8H  . multivitamin  1 tablet Oral QHS  . pantoprazole (PROTONIX) IV  40 mg Intravenous Q24H  . sevelamer carbonate  3,200-4,000 mg Oral TID WC   Dialysis Orders:  MWF  - [facility] [address] 4hrs, BFR 400,  DFR 800,   EDW 75.5kg,  2K/ 2.25Ca  Access: LU AVG  Heparin  9000 Unit bolus  mircera 11mcg IV q2wks - last 1/23 Calcitriol 1.61mcg PO qHD   Assessment/Plan: 1. Sepsis 2/2 cellulitis of RLE, chronic diabetic ulcer (R lateral plantar surface wound 5th metatarsal head, superficial ulceration between great and 2nd toe). No osteo radiographically. On empiric vanco and zosyn. Wound care has seen.  2. ESRD -  MWF [facility]. HD today.Pre weight 74.5. No edema 3. Hypertension/volume  -CXR no pulm edema. Typically does not have large gains. Standing weights ordered. Titrate down volume as tolerated. Variable adherence to bp meds. Amlodipine, coreg, losartan  4. Anemia  - Hgb 10.1. Rec'd Mircera 75 mcg on 10/20/17 as outpt. Trend labs.   5. Secondary hyperparathyroidism - Continue calcitriol, binders, and sensipar.   6. Nutrition - Renal diet with fluid restrictions.  7. Diabetes - on insulin, per primary 8. Severe gastrparesis and diabetic diarrhea. PPI and reglan ordered  Jamal Maes, MD [facility] [phone] pager 10/27/2017, 10:59 AM

## 2017-10-27 NOTE — Consult Note (Signed)
Subjective:  Patient ID: [name], male    DOB: 18-Feb-1981,  MRN: [id number]  Reason for Consult: Cellulitis R Foot Referring Physician: Dr. Dionne Ano is an 37 y.o. male.  HPI: Mr. Vandyken is a 37 year old male currently followed by Arnell Sieving as an outpatient for bilateral foot wounds.  Patient states that he was at dialysis and the dialysis staff noticed that his foot was red and recommended he follow-up in the office as it is possible.  There he saw Dr. Earley Favor who noted cellulitis of the right lower extremity and recommended presentation to the ED for possible admission.  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  Patient states that the foot is very painful pain described as stabbing.  Past Medical History:  Diagnosis Date  . Diabetes mellitus without complication ([place])   . Diabetic gastroparesis ([place])   . Dialysis patient ([place])   . Hypertension   . Renal disorder    Dialysis  . Sepsis [facility])     Past Surgical History:  Procedure Laterality Date  . AV FISTULA PLACEMENT     left arm.  . AV FISTULA PLACEMENT Right 12/22/2016   Procedure: INSERTION OF ARTERIOVENOUS (AV) GORE-TEX GRAFT ARM;  Surgeon: Elam Dutch, MD;  Location: [place];  Service: Vascular;  Laterality: Right;  . EYE SURGERY    . INSERTION OF DIALYSIS CATHETER     Right subclavian  . UPPER EXTREMITY VENOGRAPHY N/A 11/16/2016   Procedure: Upper Extremity Venography - Right Central;  Surgeon: Elam Dutch, MD;  Location: [place] [facility];  Service: Cardiovascular;  Laterality: N/A;    Family History  Problem Relation Age of Onset  . Diabetes Mellitus II Unknown     Social History:  reports that  has never smoked. he has never used smokeless tobacco. He reports that he does not drink alcohol or use drugs.  Allergies:  Allergies  Allergen Reactions  . No Known Allergies     Medications: I have  reviewed the patient's current medications.  Results for orders placed or performed during the hospital encounter of 10/25/17 (from the past 48 hour(s))  CBG monitoring, ED     Status: Abnormal   Collection Time: 10/25/17  7:28 PM  Result Value Ref Range   Glucose-Capillary 186 (H) 65 - 99 mg/dL   Comment 1 Notify RN    Comment 2 Document in Chart   Culture, blood (Routine x 2)     Status: None (Preliminary result)   Collection Time: 10/25/17  7:45 PM  Result Value Ref Range   Specimen Description BLOOD LEFT HAND    Special Requests IN PEDIATRIC BOTTLE Blood Culture adequate volume    Culture NO GROWTH 2 DAYS    Report Status PENDING   I-Stat CG4 Lactic Acid, ED     Status: None   Collection Time: 10/25/17  7:58 PM  Result Value Ref Range   Lactic Acid, Venous 1.27 0.5 - 1.9 mmol/L  HIV antibody (Routine Testing)     Status: None   Collection Time: 10/26/17  4:21 AM  Result Value Ref Range   HIV Screen 4th Generation wRfx Non Reactive Non Reactive    Comment: (NOTE) Performed At: [facility] [place], [place] [id number] Rush Farmer MD NW:[id number]   CBC     Status: Abnormal   Collection Time: 10/26/17  4:21 AM  Result Value Ref Range   WBC 25.2 (H) 4.0 - 10.5 K/uL   RBC 3.34 (L) 4.22 - 5.81 MIL/uL   Hemoglobin 10.3 (L) 13.0 - 17.0 g/dL   HCT 31.9 (L) 39.0 - 52.0 %   MCV 95.5 78.0 - 100.0 fL   MCH 30.8 26.0 - 34.0 pg   MCHC 32.3 30.0 - 36.0 g/dL   RDW 14.4 11.5 - 15.5 %   Platelets 198 150 - 400 K/uL  Basic metabolic panel     Status: Abnormal   Collection Time: 10/26/17  4:21 AM  Result Value Ref Range   Sodium 130 (L) 135 - 145 mmol/L   Potassium 5.4 (H) 3.5 - 5.1 mmol/L   Chloride 90 (L) 101 - 111 mmol/L   CO2 19 (L) 22 - 32 mmol/L   Glucose, Bld 383 (H) 65 - 99 mg/dL   BUN 28 (H) 6 - 20 mg/dL   Creatinine, Ser 6.96 (H) 0.61 - 1.24 mg/dL   Calcium 9.0 8.9 - 10.3 mg/dL   GFR calc non Af Amer 9 (L) >60 mL/min   GFR calc Af Amer 10 (L)  >60 mL/min    Comment: (NOTE) The eGFR has been calculated using the CKD EPI equation. This calculation has not been validated in all clinical situations. eGFR's persistently <60 mL/min signify possible Chronic Kidney Disease.    Anion gap 21 (H) 5 - 15  Hemoglobin A1c     Status: Abnormal   Collection Time: 10/26/17  4:21 AM  Result Value Ref Range   Hgb A1c MFr Bld 6.2 (H) 4.8 - 5.6 %    Comment: (NOTE) Pre diabetes:          5.7%-6.4% Diabetes:              >6.4% Glycemic control for   <7.0% adults with diabetes    Mean Plasma Glucose 131.24 mg/dL  CBG monitoring, ED     Status: Abnormal   Collection Time: 10/26/17  6:08 AM  Result Value Ref Range   Glucose-Capillary 357 (H) 65 - 99 mg/dL  CBG monitoring, ED     Status: Abnormal   Collection Time: 10/26/17 12:29 PM  Result Value Ref Range   Glucose-Capillary 312 (H) 65 - 99 mg/dL  Gastrointestinal Panel by PCR , Stool     Status: None   Collection Time: 10/26/17  1:00 PM  Result Value Ref Range   Campylobacter species NOT DETECTED NOT DETECTED   Plesimonas shigelloides NOT DETECTED NOT DETECTED   Salmonella species NOT DETECTED NOT DETECTED   Yersinia enterocolitica NOT DETECTED NOT DETECTED   Vibrio species NOT DETECTED NOT DETECTED   Vibrio cholerae NOT DETECTED NOT DETECTED   Enteroaggregative E coli (EAEC) NOT DETECTED NOT DETECTED   Enteropathogenic E coli (EPEC) NOT DETECTED NOT DETECTED   Enterotoxigenic E coli (ETEC) NOT DETECTED NOT DETECTED   Shiga like toxin producing E coli (STEC) NOT DETECTED NOT DETECTED   Shigella/Enteroinvasive E coli (EIEC) NOT DETECTED NOT DETECTED   Cryptosporidium NOT DETECTED NOT DETECTED   Cyclospora cayetanensis NOT DETECTED NOT DETECTED   Entamoeba histolytica NOT DETECTED NOT DETECTED   Giardia lamblia NOT DETECTED NOT DETECTED   Adenovirus F40/41 NOT DETECTED NOT DETECTED   Astrovirus NOT DETECTED NOT DETECTED   Norovirus GI/GII NOT DETECTED NOT DETECTED   Rotavirus A NOT  DETECTED NOT DETECTED   Sapovirus (I, II, IV, and V) NOT DETECTED NOT DETECTED    Comment: Performed at [facility], [address] [place], [place] [address]  Glucose, capillary     Status: Abnormal   Collection Time: 10/26/17  3:54 PM  Result Value Ref Range   Glucose-Capillary 218 (H) 65 - 99 mg/dL   Comment 1 Notify RN    Comment 2 Document in Chart   MRSA PCR Screening     Status: Abnormal   Collection Time: 10/26/17  5:22 PM  Result Value Ref Range   MRSA by PCR INVALID RESULTS, SPECIMEN SENT FOR CULTURE (A) NEGATIVE    Comment:        The GeneXpert MRSA Assay (FDA approved for NASAL specimens only), is one component of a comprehensive MRSA colonization surveillance program. It is not intended to diagnose MRSA infection nor to guide or monitor treatment for MRSA infections. RESULT CALLED TO, READ BACK BY AND VERIFIED WITH: RN Nona Dell [id number] [phone] MLM   Glucose, capillary     Status: Abnormal   Collection Time: 10/26/17  9:36 PM  Result Value Ref Range   Glucose-Capillary 319 (H) 65 - 99 mg/dL  Basic metabolic panel     Status: Abnormal   Collection Time: 10/27/17  2:55 AM  Result Value Ref Range   Sodium 128 (L) 135 - 145 mmol/L   Potassium 4.8 3.5 - 5.1 mmol/L   Chloride 91 (L) 101 - 111 mmol/L   CO2 17 (L) 22 - 32 mmol/L   Glucose, Bld 352 (H) 65 - 99 mg/dL   BUN 36 (H) 6 - 20 mg/dL   Creatinine, Ser 8.74 (H) 0.61 - 1.24 mg/dL   Calcium 9.3 8.9 - 10.3 mg/dL   GFR calc non Af Amer 7 (L) >60 mL/min   GFR calc Af Amer 8 (L) >60 mL/min    Comment: (NOTE) The eGFR has been calculated using the CKD EPI equation. This calculation has not been validated in all clinical situations. eGFR's persistently <60 mL/min signify possible Chronic Kidney Disease.    Anion gap 20 (H) 5 - 15  CBC     Status: Abnormal   Collection Time: 10/27/17  2:55 AM  Result Value Ref Range   WBC 24.8 (H) 4.0 - 10.5 K/uL   RBC 3.27 (L) 4.22 - 5.81 MIL/uL   Hemoglobin 10.1 (L)  13.0 - 17.0 g/dL   HCT 30.6 (L) 39.0 - 52.0 %   MCV 93.6 78.0 - 100.0 fL   MCH 30.9 26.0 - 34.0 pg   MCHC 33.0 30.0 - 36.0 g/dL   RDW 13.8 11.5 - 15.5 %   Platelets 213 150 - 400 K/uL  Phosphorus     Status: Abnormal   Collection Time: 10/27/17  2:55 AM  Result Value Ref Range   Phosphorus 5.3 (H) 2.5 - 4.6 mg/dL  Magnesium     Status: None   Collection Time: 10/27/17  2:55 AM  Result Value Ref Range   Magnesium 2.3 1.7 - 2.4 mg/dL  Glucose, capillary     Status: Abnormal   Collection Time: 10/27/17  6:02 AM  Result Value Ref Range   Glucose-Capillary 478 (H) 65 - 99 mg/dL  Glucose, capillary     Status: Abnormal   Collection Time: 10/27/17 12:58 PM  Result Value Ref Range   Glucose-Capillary 277 (H) 65 - 99 mg/dL  Glucose, capillary     Status: Abnormal   Collection Time: 10/27/17  4:46 PM  Result Value Ref Range   Glucose-Capillary 364 (H) 65 - 99 mg/dL    Dg Foot Complete Right  Result Date: 10/27/2017 CLINICAL DATA:  Right foot infection. Pt. Says sores are on lateral 5th metatarsal and under heel. Hx of diabetes mellitus. EXAM: RIGHT FOOT COMPLETE - 3+ VIEW COMPARISON:  None. FINDINGS: Soft tissue lucency/irregularity lateral to the fifth MTP joint, compatible with the given history of sore. Adjacent osseous structures appear intact and normal in mineralization. No destructive changes to suggest osteomyelitis. No fracture line or displaced fracture fragment seen. Vascular calcifications seen throughout the foot. IMPRESSION: 1. Soft tissue lucency lateral to the fifth MTP joint, consistent with the given history of soft tissue infection/sore. 2. Adjacent osseous structures appear normal. No evidence of osteomyelitis. Electronically Signed   By: Franki Cabot M.D.   On: 10/27/2017 00:37    ROS Blood pressure 127/71, pulse (!) 102, temperature (!) 100.6 F (38.1 C), temperature source Tympanic, resp. rate 18, height _0  (1.676 m), weight 156 lb 8.4 oz (71 kg), SpO2 100  %. Physical Exam  General AA&O x3. Normal mood and affect.  Vascular Pedal pulses palpable.  Neurologic Epicritic sensation severely diminished BLE.  Dermatologic Left lateral fifth MPJ ulcerative lesion with hyperkeratosis.  No erythema warmth drainage signs of infection.  Right lateral fifth MPJ ulcer lesion with hyperkeratotic rim.  No local warmth or erythema.  Right first second third toes with edema and blistering at the plantar sulcus.  Small opening at the plantar surface of the first interspace plantarly possibly representing puncture wound.  Upon incision and drainage purulent fluid noted in the first interspace.   Orthopedic: No pain to palpation either foot.    Assessment/Plan:  Cellulitis RLE  -Right foot cellulitis noted with forefoot swelling and edema. -Bedside incision and drainage performed see full procedure note below -X-rays reviewed no underlying osseous abnormalities. -ABIs reviewed evidence of peripheral arterial disease -Labs reviewed.  Continued leukocytosis despite antibiotic therapy. -At the present time it appears that the patient's symptoms do not seem to be extension from the lateral foot wound.  Small opening at the plantar surface of the foot could represent puncture wound.  X-rays show no sign of foreign body. -Purulent fluid noted at the first interspace.  Sent for culture. Since patient is experiencing erythema edema and continued elevation of his white blood cell count despite antibiotics concern for abscess formation.  Recommend MRI for further evaluation.  Will monitor for signs of reduction of erythema and edema status post incision and drainage.  Should MRI suggest further abscess patient would benefit from operative debridement and irrigation. -We will follow-up I&D culture.  Recommend empiric vancomycin and Zosyn. -Podiatry will continue to follow  Procedure: Incision and drainage right foot abscess Anesthesia: none Instrumentation: Sterile  scissors, forcep Technique: Consent obtained.  The right lower extremity was prepped with Betadine.  Sterile scissors were used to make a stab incision about the fluid-filled bulla at the sulcus of the great toe.  Seropurulent fluid was expressed.  All redundant tissue was thoroughly excised with pickup and scissors.  Additional stab incision was made at the first interspace and blunt dissection was performed with return of purulent material.  This material was cultured with a culture swab.  After all expressible purulence was removed the foot was then irrigated with 250 mL of normal saline.  The foot was then dressed with 4 x 4 Kerlix and Ace bandage patient tolerated procedure well. Dressing: Dry, sterile, compression dressing. Disposition: Patient tolerated procedure well.  We will follow-up culture results.  Podiatry will continue to follow  Evelina Bucy 10/27/2017,  7:26 PM   Total lower time 30 minutes

## 2017-10-27 NOTE — Progress Notes (Addendum)
Patient refusing his lantus at this time.  Patient states that he is not just going to take lantus only when his current CBG 257.  Patient states that he needs short acting coverage also at this time due to he is about to eat also.  RN notified provider

## 2017-10-27 NOTE — Care Management Note (Signed)
Case Management Note  Patient Details  Name: [name] MRN: [id number] Date of Birth: 03/12/1981  Subjective/Objective:      Pt admitted with sepsis. He is ESRD M/W/F. He is from home with parents.               Action/Plan: Plan is for patient to return home when medically stable. CM following for d/c needs, physician orders.    Expected Discharge Date:                  Expected Discharge Plan:  Home/Self Care  In-House Referral:     Discharge planning Services     Post Acute Care Choice:    Choice offered to:     DME Arranged:    DME Agency:     HH Arranged:    HH Agency:     Status of Service:  In process, will continue to follow  If discussed at Long Length of Stay Meetings, dates discussed:    Additional Comments:  Pollie Friar, RN 10/27/2017, 2:42 PM

## 2017-10-28 ENCOUNTER — Ambulatory Visit: Payer: Medicare Other | Admitting: Podiatry

## 2017-10-28 DIAGNOSIS — Z992 Dependence on renal dialysis: Secondary | ICD-10-CM | POA: Diagnosis not present

## 2017-10-28 DIAGNOSIS — I129 Hypertensive chronic kidney disease with stage 1 through stage 4 chronic kidney disease, or unspecified chronic kidney disease: Secondary | ICD-10-CM | POA: Diagnosis not present

## 2017-10-28 DIAGNOSIS — N186 End stage renal disease: Secondary | ICD-10-CM | POA: Diagnosis not present

## 2017-10-28 LAB — CBC
HCT: 30.4 % — ABNORMAL LOW (ref 39.0–52.0)
Hemoglobin: 9.9 g/dL — ABNORMAL LOW (ref 13.0–17.0)
MCH: 30.5 pg (ref 26.0–34.0)
MCHC: 32.6 g/dL (ref 30.0–36.0)
MCV: 93.5 fL (ref 78.0–100.0)
Platelets: 245 10*3/uL (ref 150–400)
RBC: 3.25 MIL/uL — ABNORMAL LOW (ref 4.22–5.81)
RDW: 13.8 % (ref 11.5–15.5)
WBC: 21.7 10*3/uL — ABNORMAL HIGH (ref 4.0–10.5)

## 2017-10-28 LAB — RENAL FUNCTION PANEL
Albumin: 3 g/dL — ABNORMAL LOW (ref 3.5–5.0)
Anion gap: 16 — ABNORMAL HIGH (ref 5–15)
BUN: 22 mg/dL — ABNORMAL HIGH (ref 6–20)
CO2: 20 mmol/L — ABNORMAL LOW (ref 22–32)
Calcium: 9 mg/dL (ref 8.9–10.3)
Chloride: 93 mmol/L — ABNORMAL LOW (ref 101–111)
Creatinine, Ser: 5.94 mg/dL — ABNORMAL HIGH (ref 0.61–1.24)
GFR calc Af Amer: 13 mL/min — ABNORMAL LOW (ref >60)
GFR calc non Af Amer: 11 mL/min — ABNORMAL LOW (ref >60)
Glucose, Bld: 416 mg/dL — ABNORMAL HIGH (ref 65–99)
Phosphorus: 3 mg/dL (ref 2.5–4.6)
Potassium: 4.5 mmol/L (ref 3.5–5.1)
Sodium: 129 mmol/L — ABNORMAL LOW (ref 135–145)

## 2017-10-28 LAB — GLUCOSE, CAPILLARY
Glucose-Capillary: 212 mg/dL — ABNORMAL HIGH (ref 65–99)
Glucose-Capillary: 241 mg/dL — ABNORMAL HIGH (ref 65–99)
Glucose-Capillary: 248 mg/dL — ABNORMAL HIGH (ref 65–99)
Glucose-Capillary: 332 mg/dL — ABNORMAL HIGH (ref 65–99)
Glucose-Capillary: 333 mg/dL — ABNORMAL HIGH (ref 65–99)
Glucose-Capillary: 412 mg/dL — ABNORMAL HIGH (ref 65–99)

## 2017-10-28 LAB — MRSA PCR SCREENING: MRSA by PCR: NEGATIVE

## 2017-10-28 MED ORDER — INSULIN ASPART 100 UNIT/ML ~~LOC~~ SOLN: 0.0000 [IU] | Freq: Every day | SUBCUTANEOUS | Status: DC

## 2017-10-28 MED ORDER — INSULIN ASPART 100 UNIT/ML ~~LOC~~ SOLN
8.0000 [IU] | Freq: Three times a day (TID) | SUBCUTANEOUS | Status: DC
  Administered 2017-10-28 (×2): 8 [IU] via SUBCUTANEOUS
  Administered 2017-10-29: 5 [IU] via SUBCUTANEOUS
  Administered 2017-10-29: 7 [IU] via SUBCUTANEOUS
  Administered 2017-10-29: 5 [IU] via SUBCUTANEOUS
  Administered 2017-10-30 – 2017-11-02 (×7): 8 [IU] via SUBCUTANEOUS

## 2017-10-28 MED ORDER — INSULIN ASPART 100 UNIT/ML ~~LOC~~ SOLN: 0.0000 [IU] | Freq: Three times a day (TID) | SUBCUTANEOUS | Status: DC

## 2017-10-28 MED ORDER — INSULIN GLARGINE 100 UNIT/ML ~~LOC~~ SOLN
20.0000 [IU] | Freq: Every day | SUBCUTANEOUS | Status: DC
  Administered 2017-10-28 – 2017-10-29 (×2): 20 [IU] via SUBCUTANEOUS
  Filled 2017-10-28 (×2): qty 0.2

## 2017-10-28 MED ORDER — INSULIN ASPART 100 UNIT/ML ~~LOC~~ SOLN
0.0000 [IU] | Freq: Three times a day (TID) | SUBCUTANEOUS | Status: DC
Start: 2017-10-28 — End: 2017-10-28

## 2017-10-28 MED ORDER — SEVELAMER CARBONATE 800 MG PO TABS
3200.0000 mg | ORAL_TABLET | Freq: Three times a day (TID) | ORAL | Status: DC
  Administered 2017-10-28 – 2017-11-03 (×16): 3200 mg via ORAL
  Filled 2017-10-28 (×17): qty 4

## 2017-10-28 MED ORDER — INSULIN ASPART 100 UNIT/ML ~~LOC~~ SOLN
0.0000 [IU] | Freq: Every day | SUBCUTANEOUS | Status: DC
  Administered 2017-10-28: 4 [IU] via SUBCUTANEOUS
  Administered 2017-10-28 – 2017-11-02 (×3): 2 [IU] via SUBCUTANEOUS

## 2017-10-28 NOTE — Progress Notes (Signed)
Notified Schorr of CBG 412, orders received to give 9 units of sliding scale.

## 2017-10-28 NOTE — Consult Note (Signed)
[facility] Nurse was following along remotely awaiting podiatry evaluation, noted patient was seen by Dr. March Rummage 10/27/17 and he performed I &D and has ordered further work up. Will sign off. Please obtain wound care orders from Dr. March Rummage moving forward.  [facility], [place], [place]

## 2017-10-28 NOTE — Progress Notes (Addendum)
[facility] Rounding Note  Subjective/Interval History:  Had HD yesterday on schedule Weight to 71 kg  Dr. March Rummage debrided foot yesterday MRI ordered  Objective Vital signs in last 24 hours: Vitals:   10/27/17 2159 10/28/17 0202 10/28/17 0556 10/28/17 0948  BP: (!) 150/82 130/65 (!) 151/87 123/66  Pulse: (!) 101 96 98 98  Resp: 19 17 18 18   Temp: 99.7 F (37.6 C) 99.2 F (37.3 C) 98.9 F (37.2 C)   TempSrc: Oral Oral Oral   SpO2: 100% 100% 100% 100%  Weight:      Height:       Weight change: -0.9 kg (-15.8 oz)  Intake/Output Summary (Last 24 hours) at 10/28/2017 1110 Last data filed at 10/28/2017 0900 Gross per 24 hour  Intake 1080 ml  Output 3500 ml  Net -2420 ml   Physical Exam:  Blood pressure 123/66, pulse 98, temperature 98.9 F (37.2 C), temperature source Oral, resp. rate 18, height 5\' 6"  (1.676 m), weight 71 kg (156 lb 8.4 oz), SpO2 100 %.   Very nice young man VS as noted No rashes, many tattoos Normal heart sounds S1S2 No S3 Abd soft and not tender Foot wrapped so wounds not examined No LE edema, RLE warm vs left Pedal pulses palpable R AV graft + bruit   Recent Labs  Lab 10/25/17 1800 10/26/17 0421 10/27/17 0255 10/28/17 0221  NA 130* 130* 128* 129*  K 4.6 5.4* 4.8 4.5  CL 89* 90* 91* 93*  CO2 20* 19* 17* 20*  GLUCOSE 189* 383* 352* 416*  BUN 19 28* 36* 22*  CREATININE 5.81* 6.96* 8.74* 5.94*  CALCIUM 8.8* 9.0 9.3 9.0  PHOS  --   --  5.3* 3.0   Recent Labs  Lab 10/25/17 1800 10/28/17 0221  AST 18  --   ALT 10*  --   ALKPHOS 114  --   BILITOT 2.0*  --   PROT 7.1  --   ALBUMIN 3.4* 3.0*    Recent Labs  Lab 10/25/17 1800 10/26/17 0421 10/27/17 0255 10/28/17 0759  WBC 24.7* 25.2* 24.8* 21.7*  NEUTROABS 20.0*  --   --   --   HGB 10.3* 10.3* 10.1* 9.9*  HCT 31.3* 31.9* 30.6* 30.4*  MCV 94.3 95.5 93.6 93.5  PLT 186 198 213 245    Recent Labs  Lab 10/27/17 1258 10/27/17 1646 10/27/17 2154 10/28/17 0003 10/28/17 0635  GLUCAP  277* 364* 257* 332* 412*   Studies/Results: Dg Foot Complete Right  Result Date: 10/27/2017 CLINICAL DATA:  Right foot infection. Pt. Says sores are on lateral 5th metatarsal and under heel. Hx of diabetes mellitus. EXAM: RIGHT FOOT COMPLETE - 3+ VIEW COMPARISON:  None. FINDINGS: Soft tissue lucency/irregularity lateral to the fifth MTP joint, compatible with the given history of sore. Adjacent osseous structures appear intact and normal in mineralization. No destructive changes to suggest osteomyelitis. No fracture line or displaced fracture fragment seen. Vascular calcifications seen throughout the foot. IMPRESSION: 1. Soft tissue lucency lateral to the fifth MTP joint, consistent with the given history of soft tissue infection/sore. 2. Adjacent osseous structures appear normal. No evidence of osteomyelitis. Electronically Signed   By: Franki Cabot M.D.   On: 10/27/2017 00:37   Medications: . piperacillin-tazobactam (ZOSYN)  IV 3.375 g (10/28/17 0816)  . [START ON 10/29/2017] vancomycin     . amLODipine  10 mg Oral QHS  . cinacalcet  30 mg Oral Q supper  . heparin  5,000 Units Subcutaneous Q8H  .  insulin aspart  0-5 Units Subcutaneous QHS  . insulin aspart  0-9 Units Subcutaneous TID WC  . insulin aspart  8 Units Subcutaneous TID AC  . insulin glargine  20 Units Subcutaneous QHS  . losartan  50 mg Oral QHS  . metoCLOPramide (REGLAN) injection  5 mg Intravenous Q8H  . multivitamin  1 tablet Oral QHS  . pantoprazole  40 mg Oral Daily  . sevelamer carbonate  3,200-4,000 mg Oral TID WC   Dialysis Orders:  MWF  - [facility] [address] 4hrs, BFR 400,  DFR 800,   EDW 75.5kg, Will have lower EDW at discharge 2K/ 2.25Ca  Access: LU AVG  Heparin 9000 Unit bolus  mircera 43mcg IV q2wks - last 1/23 Calcitriol 1.20mcg PO qHD   Assessment/Plan: 1. Sepsis 2/2 cellulitis of RLE, chronic diabetic ulcer (R lateral plantar surface wound 5th metatarsal head, superficial ulceration between great and 2nd  toe). No osteo radiographically. On empiric vanco and zosyn. Wound care has seen and have deferred wound care plan to Dr. March Rummage who saw on 1/30 and debrided foot. Some decline in WBC tho still >20K. For MRI of foot today  2. ESRD -  MWF [facility]. Will have lower EDW at d/c 3. Hypertension/volume  -CXR adm no pulm edema. Post HD weight if believable was down to 71 kg yesterday.  Will have lower EDW at discharge, Variable adherence to bp meds. Amlodipine, coreg, losartan  4. Anemia  - Hgb 9.9 . Rec'd Mircera 75 mcg on 10/20/17 as outpt. Trend labs.   5. Secondary hyperparathyroidism - Continue calcitriol, binders, and sensipar.   6. Nutrition - Renal diet with fluid restrictions.  7. Diabetes - on insulin, per primary 8. Severe gastrparesis and diabetic diarrhea. PPI and reglan ordered  Jamal Maes, MD [facility] [phone] pager 10/28/2017, 11:10 AM

## 2017-10-28 NOTE — Progress Notes (Signed)
PROGRESS NOTE    [name]  [name] DOB: 10-Aug-1981 DOA: 10/25/2017 PCP: Patient, No Pcp Per     Brief Narrative:  [name] is a 37 y.o. male with medical history significant of DM type I, ESRD on HD, and diabetic foot ulcer followed by Dr. March Rummage; who presents with complaints of right foot swelling.  During hemodialysis, it was noticed that he had swelling and red discoloration of his right foot.  He states that he has no feeling in his feet, and did not noticed the symptoms until they pointed out.  He does not recall any recent trauma or injury to his foot.  Patient has a known ulceration of the lateral aspect of the right foot that he states has been there for years.  He had just recently reestablished care and have been being followed by Dr. March Rummage of podiatry.  He was made a urgent appointment at his podiatrist's office and was seen by Dr. Daylene Katayama.  X-rays were obtained which showed no signs of osseous involvement, but he was sent to the emergency department for need of IV antibiotics.    Assessment & Plan:   Principal Problem:   Sepsis ([place]) Active Problems:   Diabetes mellitus type 1 ([place])   ESRD (end stage renal disease) on dialysis ([place])   Essential (primary) hypertension   Cellulitis of right foot   Abscess or cellulitis of toe, right   Cellulitis of right lower extremity   Right foot infection   Diabetic foot infection ([name])   Sepsis 2/2cellulitis ofright foot,diabetic ulcer -On presentation, tachycardic with fever up to 104.1 F, WBC 25.2, and lactic acid 2.03 -Continue empiric antibiotics of vancomycin and Zosyn -ABI unable to obtain due to non-compressible arteries likely due to calcification  -Consulted Dr. March Rummage, underwent bedside I&D right foot abscess  -MRI ordered   ESRD on HD -Nephrology consulted  Concern for STD -HIV NR  -GC/Chlamydia negative   Diabetes mellitus type1 -Ha1c 6.2  -Continue Lantus/NovoLog and sliding scale insulin.  Dose adjusted today.   Essential hypertension -Continue amlodipine, losartan  Anxiety -Continue Klonopintwice a day prn  Intractable nausea -Improved continue reglan, PPI   Diarrhea -After receiving kayexalate. GI PCR negative. No further diarrhea since last night.    DVT prophylaxis: subq hep Code Status: Full Family Communication: No family at bedside Disposition Plan: Pending improvement    Consultants:   Podiatry  Procedures:   Bedside I&D right foot abscess 1/30 Dr. March Rummage   Antimicrobials:  Anti-infectives (From admission, onward)   Start     Dose/Rate Route Frequency Ordered Stop   10/29/17 1200  vancomycin (VANCOCIN) IVPB 750 mg/150 ml premix     750 mg 150 mL/hr over 60 Minutes Intravenous Every M-W-F (Hemodialysis) 10/27/17 1158     10/27/17 1200  vancomycin (VANCOCIN) IVPB 1000 mg/200 mL premix  Status:  Discontinued     1,000 mg 200 mL/hr over 60 Minutes Intravenous Every M-W-F (Hemodialysis) 10/25/17 2120 10/27/17 1158   10/26/17 0800  piperacillin-tazobactam (ZOSYN) IVPB 3.375 g     3.375 g 12.5 mL/hr over 240 Minutes Intravenous Every 12 hours 10/25/17 2117     10/25/17 2015  piperacillin-tazobactam (ZOSYN) IVPB 3.375 g     3.375 g 100 mL/hr over 30 Minutes Intravenous  Once 10/25/17 2012 10/25/17 2100   10/25/17 2015  vancomycin (VANCOCIN) IVPB 1000 mg/200 mL premix     1,000 mg 200 mL/hr over 60 Minutes Intravenous  Once 10/25/17 2012 10/25/17 2126  Subjective: Wants to go home. No new issues or complaints.   Objective: Vitals:   10/27/17 2159 10/28/17 0202 10/28/17 0556 10/28/17 0948  BP: (!) 150/82 130/65 (!) 151/87 123/66  Pulse: (!) 101 96 98 98  Resp: 19 17 18 18   Temp: 99.7 F (37.6 C) 99.2 F (37.3 C) 98.9 F (37.2 C)   TempSrc: Oral Oral Oral   SpO2: 100% 100% 100% 100%  Weight:      Height:        Intake/Output Summary (Last 24 hours) at 10/28/2017 1152 Last data filed at 10/28/2017 0900 Gross per 24 hour  Intake  1080 ml  Output 3500 ml  Net -2420 ml   Filed Weights   10/26/17 1516 10/27/17 0755 10/27/17 1213  Weight: 75.4 kg (166 lb 3.6 oz) 74.5 kg (164 lb 3.9 oz) 71 kg (156 lb 8.4 oz)    Examination:  General exam: Appears calm and comfortable  Respiratory system: Clear to auscultation. Respiratory effort normal. Cardiovascular system: S1 & S2 heard, RRR. No JVD, murmurs, rubs, gallops or clicks. No pedal edema. Gastrointestinal system: Abdomen is nondistended, soft and nontender. No organomegaly or masses felt. Normal bowel sounds heard. Central nervous system: Alert and oriented. No focal neurological deficits. Extremities: Symmetric 5 x 5 power. Skin: right foot dressed after I&D, clean and dry  Psychiatry: Judgement and insight appear stable   Data Reviewed: I have personally reviewed following labs and imaging studies  CBC: Recent Labs  Lab 10/25/17 1800 10/26/17 0421 10/27/17 0255 10/28/17 0759  WBC 24.7* 25.2* 24.8* 21.7*  NEUTROABS 20.0*  --   --   --   HGB 10.3* 10.3* 10.1* 9.9*  HCT 31.3* 31.9* 30.6* 30.4*  MCV 94.3 95.5 93.6 93.5  PLT 186 198 213 161   Basic Metabolic Panel: Recent Labs  Lab 10/25/17 1800 10/26/17 0421 10/27/17 0255 10/28/17 0221  NA 130* 130* 128* 129*  K 4.6 5.4* 4.8 4.5  CL 89* 90* 91* 93*  CO2 20* 19* 17* 20*  GLUCOSE 189* 383* 352* 416*  BUN 19 28* 36* 22*  CREATININE 5.81* 6.96* 8.74* 5.94*  CALCIUM 8.8* 9.0 9.3 9.0  MG  --   --  2.3  --   PHOS  --   --  5.3* 3.0   GFR: Estimated Creatinine Clearance: 15.2 mL/min (A) (by C-G formula based on SCr of 5.94 mg/dL (H)). Liver Function Tests: Recent Labs  Lab 10/25/17 1800 10/28/17 0221  AST 18  --   ALT 10*  --   ALKPHOS 114  --   BILITOT 2.0*  --   PROT 7.1  --   ALBUMIN 3.4* 3.0*   No results for input(s): LIPASE, AMYLASE in the last 168 hours. No results for input(s): AMMONIA in the last 168 hours. Coagulation Profile: Recent Labs  Lab 10/25/17 1800  INR 1.16    Cardiac Enzymes: No results for input(s): CKTOTAL, CKMB, CKMBINDEX, TROPONINI in the last 168 hours. BNP (last 3 results) No results for input(s): PROBNP in the last 8760 hours. HbA1C: Recent Labs    10/26/17 0421  HGBA1C 6.2*   CBG: Recent Labs  Lab 10/27/17 1646 10/27/17 2154 10/28/17 0003 10/28/17 0635 10/28/17 1123  GLUCAP 364* 257* 332* 412* 333*   Lipid Profile: No results for input(s): CHOL, HDL, LDLCALC, TRIG, CHOLHDL, LDLDIRECT in the last 72 hours. Thyroid Function Tests: No results for input(s): TSH, T4TOTAL, FREET4, T3FREE, THYROIDAB in the last 72 hours. Anemia Panel: No results for input(s): VITAMINB12,  FOLATE, FERRITIN, TIBC, IRON, RETICCTPCT in the last 72 hours. Sepsis Labs: Recent Labs  Lab 10/25/17 1814 10/25/17 1958  LATICACIDVEN 2.03* 1.27    Recent Results (from the past 240 hour(s))  Culture, blood (Routine x 2)     Status: None (Preliminary result)   Collection Time: 10/25/17  6:00 PM  Result Value Ref Range Status   Specimen Description BLOOD BLOOD LEFT FOREARM  Final   Special Requests   Final    BOTTLES DRAWN AEROBIC AND ANAEROBIC Blood Culture adequate volume   Culture NO GROWTH 2 DAYS  Final   Report Status PENDING  Incomplete  Culture, blood (Routine x 2)     Status: None (Preliminary result)   Collection Time: 10/25/17  7:45 PM  Result Value Ref Range Status   Specimen Description BLOOD LEFT HAND  Final   Special Requests IN PEDIATRIC BOTTLE Blood Culture adequate volume  Final   Culture NO GROWTH 2 DAYS  Final   Report Status PENDING  Incomplete  Gastrointestinal Panel by PCR , Stool     Status: None   Collection Time: 10/26/17  1:00 PM  Result Value Ref Range Status   Campylobacter species NOT DETECTED NOT DETECTED Final   Plesimonas shigelloides NOT DETECTED NOT DETECTED Final   Salmonella species NOT DETECTED NOT DETECTED Final   Yersinia enterocolitica NOT DETECTED NOT DETECTED Final   Vibrio species NOT DETECTED NOT  DETECTED Final   Vibrio cholerae NOT DETECTED NOT DETECTED Final   Enteroaggregative E coli (EAEC) NOT DETECTED NOT DETECTED Final   Enteropathogenic E coli (EPEC) NOT DETECTED NOT DETECTED Final   Enterotoxigenic E coli (ETEC) NOT DETECTED NOT DETECTED Final   Shiga like toxin producing E coli (STEC) NOT DETECTED NOT DETECTED Final   Shigella/Enteroinvasive E coli (EIEC) NOT DETECTED NOT DETECTED Final   Cryptosporidium NOT DETECTED NOT DETECTED Final   Cyclospora cayetanensis NOT DETECTED NOT DETECTED Final   Entamoeba histolytica NOT DETECTED NOT DETECTED Final   Giardia lamblia NOT DETECTED NOT DETECTED Final   Adenovirus F40/41 NOT DETECTED NOT DETECTED Final   Astrovirus NOT DETECTED NOT DETECTED Final   Norovirus GI/GII NOT DETECTED NOT DETECTED Final   Rotavirus A NOT DETECTED NOT DETECTED Final   Sapovirus (I, II, IV, and V) NOT DETECTED NOT DETECTED Final    Comment: Performed at [facility], [address] [place], [place] [address]  MRSA PCR Screening     Status: Abnormal   Collection Time: 10/26/17  5:22 PM  Result Value Ref Range Status   MRSA by PCR INVALID RESULTS, SPECIMEN SENT FOR CULTURE (A) NEGATIVE Final    Comment:        The GeneXpert MRSA Assay (FDA approved for NASAL specimens only), is one component of a comprehensive MRSA colonization surveillance program. It is not intended to diagnose MRSA infection nor to guide or monitor treatment for MRSA infections. RESULT CALLED TO, READ BACK BY AND VERIFIED WITH: RN Nona Dell [phone] [facility]        Radiology Studies: Dg Foot Complete Right  Result Date: 10/27/2017 CLINICAL DATA:  Right foot infection. Pt. Says sores are on lateral 5th metatarsal and under heel. Hx of diabetes mellitus. EXAM: RIGHT FOOT COMPLETE - 3+ VIEW COMPARISON:  None. FINDINGS: Soft tissue lucency/irregularity lateral to the fifth MTP joint, compatible with the given history of sore. Adjacent osseous structures appear intact  and normal in mineralization. No destructive changes to suggest osteomyelitis. No fracture line or displaced fracture  fragment seen. Vascular calcifications seen throughout the foot. IMPRESSION: 1. Soft tissue lucency lateral to the fifth MTP joint, consistent with the given history of soft tissue infection/sore. 2. Adjacent osseous structures appear normal. No evidence of osteomyelitis. Electronically Signed   By: Franki Cabot M.D.   On: 10/27/2017 00:37      Scheduled Meds: . amLODipine  10 mg Oral QHS  . cinacalcet  30 mg Oral Q supper  . heparin  5,000 Units Subcutaneous Q8H  . insulin aspart  0-5 Units Subcutaneous QHS  . insulin aspart  0-9 Units Subcutaneous TID WC  . insulin aspart  8 Units Subcutaneous TID AC  . insulin glargine  20 Units Subcutaneous QHS  . losartan  50 mg Oral QHS  . metoCLOPramide (REGLAN) injection  5 mg Intravenous Q8H  . multivitamin  1 tablet Oral QHS  . pantoprazole  40 mg Oral Daily  . sevelamer carbonate  3,200-4,000 mg Oral TID WC   Continuous Infusions: . piperacillin-tazobactam (ZOSYN)  IV 3.375 g (10/28/17 0816)  . [START ON 10/29/2017] vancomycin       LOS: 3 days    Time spent: 30 minutes   Dessa Phi, DO [facility] [email or web address] Password [facility] 10/28/2017, 11:52 AM

## 2017-10-29 ENCOUNTER — Inpatient Hospital Stay (HOSPITAL_COMMUNITY): Payer: Medicare Other

## 2017-10-29 ENCOUNTER — Telehealth: Payer: Self-pay | Admitting: *Deleted

## 2017-10-29 DIAGNOSIS — I129 Hypertensive chronic kidney disease with stage 1 through stage 4 chronic kidney disease, or unspecified chronic kidney disease: Secondary | ICD-10-CM | POA: Diagnosis not present

## 2017-10-29 DIAGNOSIS — Z992 Dependence on renal dialysis: Secondary | ICD-10-CM | POA: Diagnosis not present

## 2017-10-29 DIAGNOSIS — N186 End stage renal disease: Secondary | ICD-10-CM | POA: Diagnosis not present

## 2017-10-29 LAB — BASIC METABOLIC PANEL
Anion gap: 13 (ref 5–15)
BUN: 36 mg/dL — ABNORMAL HIGH (ref 6–20)
CO2: 23 mmol/L (ref 22–32)
Calcium: 8.8 mg/dL — ABNORMAL LOW (ref 8.9–10.3)
Chloride: 96 mmol/L — ABNORMAL LOW (ref 101–111)
Creatinine, Ser: 8.09 mg/dL — ABNORMAL HIGH (ref 0.61–1.24)
GFR calc Af Amer: 9 mL/min — ABNORMAL LOW (ref >60)
GFR calc non Af Amer: 7 mL/min — ABNORMAL LOW (ref >60)
Glucose, Bld: 172 mg/dL — ABNORMAL HIGH (ref 65–99)
Potassium: 4.2 mmol/L (ref 3.5–5.1)
Sodium: 132 mmol/L — ABNORMAL LOW (ref 135–145)

## 2017-10-29 LAB — CBC
HCT: 28.6 % — ABNORMAL LOW (ref 39.0–52.0)
Hemoglobin: 9.2 g/dL — ABNORMAL LOW (ref 13.0–17.0)
MCH: 30 pg (ref 26.0–34.0)
MCHC: 32.2 g/dL (ref 30.0–36.0)
MCV: 93.2 fL (ref 78.0–100.0)
Platelets: 261 10*3/uL (ref 150–400)
RBC: 3.07 MIL/uL — ABNORMAL LOW (ref 4.22–5.81)
RDW: 13.7 % (ref 11.5–15.5)
WBC: 15.2 10*3/uL — ABNORMAL HIGH (ref 4.0–10.5)

## 2017-10-29 LAB — GLUCOSE, CAPILLARY
Glucose-Capillary: 163 mg/dL — ABNORMAL HIGH (ref 65–99)
Glucose-Capillary: 117 mg/dL — ABNORMAL HIGH (ref 65–99)
Glucose-Capillary: 99 mg/dL (ref 65–99)
Glucose-Capillary: 340 mg/dL — ABNORMAL HIGH (ref 65–99)
Glucose-Capillary: 210 mg/dL — ABNORMAL HIGH (ref 65–99)
Glucose-Capillary: 71 mg/dL (ref 65–99)

## 2017-10-29 MED ORDER — CLINDAMYCIN HCL 300 MG PO CAPS: 300.0000 mg | ORAL_CAPSULE | Freq: Four times a day (QID) | ORAL | 0 refills | Status: DC

## 2017-10-29 MED ORDER — DARBEPOETIN ALFA 60 MCG/0.3ML IJ SOSY: 60.0000 ug | PREFILLED_SYRINGE | INTRAMUSCULAR | Status: DC

## 2017-10-29 MED ORDER — POVIDONE-IODINE 10 % EX SOLN
CUTANEOUS | Status: DC | PRN
  Administered 2017-11-02: 15:00:00 via TOPICAL
  Filled 2017-10-29: qty 118

## 2017-10-29 MED ORDER — VANCOMYCIN HCL IN DEXTROSE 750-5 MG/150ML-% IV SOLN
INTRAVENOUS | Status: AC
  Filled 2017-10-29: qty 150

## 2017-10-29 MED ORDER — HEPARIN SODIUM (PORCINE) 1000 UNIT/ML IJ SOLN
9000.0000 [IU] | Freq: Once | INTRAMUSCULAR | Status: AC
  Administered 2017-10-29: 9000 [IU] via INTRAVENOUS

## 2017-10-29 MED ORDER — CIPROFLOXACIN HCL 500 MG PO TABS: 500.0000 mg | ORAL_TABLET | Freq: Two times a day (BID) | ORAL | 0 refills | Status: DC

## 2017-10-29 MED ORDER — DARBEPOETIN ALFA 60 MCG/0.3ML IJ SOSY
PREFILLED_SYRINGE | INTRAMUSCULAR | Status: AC
  Administered 2017-10-29: 60 ug via INTRAVENOUS_CENTRAL
  Filled 2017-10-29: qty 0.3

## 2017-10-29 NOTE — Progress Notes (Signed)
Patient continues to go off floor even after being told not to.

## 2017-10-29 NOTE — Telephone Encounter (Signed)
Patient called the office today multiple times and was rude to staff. He was upset that I had not seen him and believe I sent someone else to do so.  Patient was not seen yesterday as his MRI was not performed. It was completed early this AM and read by radiology this AM. I did discuss the MRI results with Dr. Maylene Roes and the plan for surgery. I was unable to see him earlier today due to office hours. He was also at dialysis as of approximately 0900. Patient was seen today late afternoon and all concerns addressed.

## 2017-10-29 NOTE — H&P (View-Only) (Signed)
  Subjective:  Patient ID: [name], male    DOB: 03-26-1981,  MRN: [id number]  Patient seen at bedside. States he wants to leave AMA and does note want to wait until Sunday for surgery.  Objective:   Vitals:   10/29/17 1400 10/29/17 1500  BP: 139/82 (!) 141/84  Pulse: 98 (!) 105  Resp: 20 20  Temp: 98.2 F (36.8 C) 98.3 F (36.8 C)  SpO2: 99% 100%   R foot exam. General AA&O x3. Normal mood and affect.  Vascular Foot warm to touch.  Neurologic Epicritic sensation absent.  Dermatologic Continued epidermolysis noted surrounding the R 1st Interspace. Small purulence expressible from the 1st interspace wound. No crepitus. Decreased cellulitis.   Submet 5 ulceration without drainage. No local warmth or erythema. No probe to bone.   Orthopedic: No pain to palpation.    Assessment & Plan:  Patient was evaluated and treated and all questions answered.  Celllulitis R Foot. -Clinically foot is improving s/p I&D with decreased cellulitis. WBC downtrending. -MRI personally reviewed. Subcutaneous air likely related to recent I&D rather than gas gangrene. No crepitus noted on exam today. Though evidence of OM of the 5th proximal phalanx on MRI, based on the appearance of the 5th metatarsal wound this is more likely chronic in nature. I do not believe patient would benefit from amputation of the digit at this time. Will plan to thoroughly debride the ulcer and better evaluate intra-operatively. -Small amount of continued purulence today noted from the first interspace. Wound cleansed and dressed with sterile dressing. Orders placed for daily packing by nursing of the 1st interspace wound. -Patient would benefit from operative debridement of both wounds. Discussed surgical plan with patient. Will plan on procedure to be performed Sunday. Patient stated he did not want to stay until Sunday for surgery and instead wanted to go home. He stated he did not want to be in the hospital at that time  and miss the Super Bowl. I discussed the importance of staying in the hospital for continued abx and for surgery. I discussed the risk of leaving the hospital including worsening infection and high risk of possible amputation. Patient stated he will proceed as planned with recommended surgery. -WBAT to the RLE. Order placed for surgical shoe. -Continue daily wound care by nursing. -Continue empiric abx therapy. Will obtain operative cultures Sunday for targeted abx therapy.  NPO Sunday for surgery. Podiatry will continue to follow.

## 2017-10-29 NOTE — Procedures (Signed)
I have personally attended this patient's dialysis session.   Weights variable and if believable up 6 kg from last HD (doubt) However I think will have lower EDW (need standing) 2K bath (K 4.2) Hb 9.2 starting aranesp 60 w/TMT today AVG no issues BFR 400  Jamal Maes, MD [place] Pager 10/29/2017, 10:33 AM

## 2017-10-29 NOTE — Progress Notes (Addendum)
Patient tolerated HD trmt today.  Net UF 5287, post wt 70.4 kg.  Reviewed/discussed with pt transplant options, encouraged to follow up with daily foot care, dental care, all areas of self care.

## 2017-10-29 NOTE — Progress Notes (Signed)
  Subjective:  Patient ID: [name], male    DOB: 10-Apr-1981,  MRN: [id number]  Patient seen at bedside. States he wants to leave AMA and does note want to wait until Sunday for surgery.  Objective:   Vitals:   10/29/17 1400 10/29/17 1500  BP: 139/82 (!) 141/84  Pulse: 98 (!) 105  Resp: 20 20  Temp: 98.2 F (36.8 C) 98.3 F (36.8 C)  SpO2: 99% 100%   R foot exam. General AA&O x3. Normal mood and affect.  Vascular Foot warm to touch.  Neurologic Epicritic sensation absent.  Dermatologic Continued epidermolysis noted surrounding the R 1st Interspace. Small purulence expressible from the 1st interspace wound. No crepitus. Decreased cellulitis.   Submet 5 ulceration without drainage. No local warmth or erythema. No probe to bone.   Orthopedic: No pain to palpation.    Assessment & Plan:  Patient was evaluated and treated and all questions answered.  Celllulitis R Foot. -Clinically foot is improving s/p I&D with decreased cellulitis. WBC downtrending. -MRI personally reviewed. Subcutaneous air likely related to recent I&D rather than gas gangrene. No crepitus noted on exam today. Though evidence of OM of the 5th proximal phalanx on MRI, based on the appearance of the 5th metatarsal wound this is more likely chronic in nature. I do not believe patient would benefit from amputation of the digit at this time. Will plan to thoroughly debride the ulcer and better evaluate intra-operatively. -Small amount of continued purulence today noted from the first interspace. Wound cleansed and dressed with sterile dressing. Orders placed for daily packing by nursing of the 1st interspace wound. -Patient would benefit from operative debridement of both wounds. Discussed surgical plan with patient. Will plan on procedure to be performed Sunday. Patient stated he did not want to stay until Sunday for surgery and instead wanted to go home. He stated he did not want to be in the hospital at that time  and miss the Super Bowl. I discussed the importance of staying in the hospital for continued abx and for surgery. I discussed the risk of leaving the hospital including worsening infection and high risk of possible amputation. Patient stated he will proceed as planned with recommended surgery. -WBAT to the RLE. Order placed for surgical shoe. -Continue daily wound care by nursing. -Continue empiric abx therapy. Will obtain operative cultures Sunday for targeted abx therapy.  NPO Sunday for surgery. Podiatry will continue to follow.

## 2017-10-29 NOTE — Telephone Encounter (Signed)
Pt states he is in the hospital and Dr. Amalia Hailey saw him yesterday and told him he would be in today to see him and someone sent some lady, and he doesn't know who she is. Pt states he finds it very disrespectful that after Dr. March Rummage said he would see him today, he send someone else. I told pt it may be a referral was sent to our office from the hospital and it was sent to Dr. Cannon Kettle as a hospital referral. I told pt I would inform Dr. March Rummage and he would be contacted back. Pt states he wants a call back "expeditely"

## 2017-10-29 NOTE — Progress Notes (Signed)
Patient off floor to the dialysis.

## 2017-10-29 NOTE — Progress Notes (Signed)
[facility] Progress Note   Subjective:   Seen during HD, tolerating well. No new complaints. Denies N/V/D today.   Objective Vitals:   10/28/17 1720 10/28/17 2200 10/29/17 0200 10/29/17 0600  BP: 135/74 (!) 127/98 (!) 155/94 (!) 160/87  Pulse: 96 (!) 42 86 92  Resp: 18 18 18 18   Temp: 98.5 F (36.9 C) 97.7 F (36.5 C) 98.6 F (37 C) 98.4 F (36.9 C)  TempSrc: Oral Oral Oral Oral  SpO2: 100% 97% 98% 96%  Weight:      Height:       Filed Weights   10/26/17 1516 10/27/17 0755 10/27/17 1213  Weight: 75.4 kg (166 lb 3.6 oz) 74.5 kg (164 lb 3.9 oz) 71 kg (156 lb 8.4 oz)   Physical Exam General:NAD, WDWN, well appearing male, many tattoos Heart:RRR, no m/r/g Lungs:CTAB, nml WOB Abdomen:soft, NTND Extremities:no edema b/l LE, RLE wrapped, palpable pulses Dialysis Access: RU AVG cannulated   Recent Labs  Lab 10/27/17 0255 10/28/17 0221 10/29/17 0543  NA 128* 129* 132*  K 4.8 4.5 4.2  CL 91* 93* 96*  CO2 17* 20* 23  GLUCOSE 352* 416* 172*  BUN 36* 22* 36*  CREATININE 8.74* 5.94* 8.09*  CALCIUM 9.3 9.0 8.8*  PHOS 5.3* 3.0  --    Liver Function Tests: Recent Labs  Lab 10/25/17 1800 10/28/17 0221  AST 18  --   ALT 10*  --   ALKPHOS 114  --   BILITOT 2.0*  --   PROT 7.1  --   ALBUMIN 3.4* 3.0*   CBC: Recent Labs  Lab 10/25/17 1800 10/26/17 0421 10/27/17 0255 10/28/17 0759 10/29/17 0543  WBC 24.7* 25.2* 24.8* 21.7* 15.2*  NEUTROABS 20.0*  --   --   --   --   HGB 10.3* 10.3* 10.1* 9.9* 9.2*  HCT 31.3* 31.9* 30.6* 30.4* 28.6*  MCV 94.3 95.5 93.6 93.5 93.2  PLT 186 198 213 245 261   Blood Culture    Component Value Date/Time   SDES NASAL SWAB 10/26/2017 2000   SPECREQUEST NONE 10/26/2017 2000   CULT (A) 10/26/2017 2000    STAPHYLOCOCCUS AUREUS SUSCEPTIBILITIES TO FOLLOW    REPTSTATUS PENDING 10/26/2017 2000   CBG: Recent Labs  Lab 10/28/17 1609 10/28/17 1756 10/28/17 2148 10/29/17 0630 10/29/17 0850  GLUCAP 212* 248* 241*  163* 117*   Studies/Results: Mr Foot Right Wo Contrast  Result Date: 10/29/2017 CLINICAL DATA:  Foot swelling, diabetes. Erythema and discoloration. Cellulitis. EXAM: MRI OF THE RIGHT FOREFOOT WITHOUT CONTRAST TECHNIQUE: Multiplanar, multisequence MR imaging of the right forefoot was performed. No intravenous contrast was administered. COMPARISON:  Radiographs from 10/26/2017 FINDINGS: Bones/Joint/Cartilage Abnormal marrow edema signal in the proximal phalanx small toe compatible with active osteomyelitis. Ligaments Lisfranc ligament intact. Muscles and Tendons Low-level edema tracking along the plantar musculature of the foot favors neurogenic edema over myositis. Soft tissues Ulceration along the plantar lateral margin of the fifth MTP joint. Surrounding cellulitis noted. Ulceration along the plantar lateral side of the base of the great toe with extensive regional cellulitis which also tracks along the ball of the foot. Difficult to exclude gas in the soft tissues along the lateral base of the great toe. Localized fluid without an overt drainable abscess. IMPRESSION: 1. 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 2. Ulceration along the plantar lateral side of the base of the great  toe with extensive focal cellulitis tracking in the great toe, and also some cellulitis tracking along the ball of the foot. Possible gas in the soft tissues along the lateral base of the great toe. No drainable abscess seen. Electronically Signed   By: Van Clines M.D.   On: 10/29/2017 08:38    Medications: . piperacillin-tazobactam (ZOSYN)  IV Stopped (10/29/17 0023)  . vancomycin     . amLODipine  10 mg Oral QHS  . cinacalcet  30 mg Oral Q supper  . heparin  5,000 Units Subcutaneous Q8H  . insulin aspart  0-5 Units Subcutaneous QHS  . insulin aspart  0-9 Units Subcutaneous TID WC  .  insulin aspart  8 Units Subcutaneous TID AC  . insulin glargine  20 Units Subcutaneous QHS  . losartan  50 mg Oral QHS  . metoCLOPramide (REGLAN) injection  5 mg Intravenous Q8H  . multivitamin  1 tablet Oral QHS  . pantoprazole  40 mg Oral Daily  . sevelamer carbonate  3,200 mg Oral TID [facility]    Dialysis Orders: MWF - [facility] [address] 4hrs, BFR400,  [id number],  EDW 75.5kg,Will have lower EDW at discharge 2K/2.25Ca  Access:LU AVG Heparin9000 Unit bolus  mircera 42mcg IV q2wks - last 1/22 Calcitriol 1.76mcg PO qHD   Assessment/Plan: 1. Sepsis 2/2 cellulitis of RLE, chronic diabetic ulcer - no osteo on xray 1/29. MRI 1/31 shows osteomyelitis of proximal phalanx of sm toe and possible gas in soft tissue along lateral base of great toe. On Vanc & zosyn. Dr. March Rummage debrided on 1/30. WBC improved 21.7>15.2. Dr. March Rummage to f/u today. 2. ESRD - HD today per regular schedule. UFG 6L. Under EDW, will decrease at d/c.  3. Anemia of CKD- Hgb 9.2, start aranesp today 20mcg IV qwk (Friday) 4. Secondary hyperparathyroidism - Labs in goal. Continue binders, oral calcitriol and sensipar. 5. HTN - BP controlled.  6. Nutrition - Renal diet with fluid restrictions.  7. Diabetes - on insulin per primary 8. Severe gastroparesis & diabetic diarrhea - per primary  Jen Mow, PA-C [name] Pager: [phone] 10/29/2017,9:42 AM  LOS: 4 days    I have seen and examined this patient and agree with plan and assessment in the above note with renal recommendations/intervention highlighted. MRI + for osteo prox phalanx small toe and ? Gas soft tissue lat base great toe. On vanco and zosym and await further recommendations from Dr. March Rummage. HD today. Weights all over the places but I think will have lower EDW at discharge.  Carma Dwiggins B,MD 10/29/2017 10:30 AM

## 2017-10-29 NOTE — Progress Notes (Signed)
PROGRESS NOTE    [name]  MAU:[id number] DOB: 07-31-1981 DOA: 10/25/2017 PCP: Patient, [name] Pcp Per     Brief Narrative:  [name] is a 37 y.o. male with medical history significant of DM type I, ESRD on HD, and diabetic foot ulcer followed by Dr. March Rummage; who presents with complaints of right foot swelling.  During hemodialysis, it was noticed that he had swelling and red discoloration of his right foot.  He states that he has [name] feeling in his feet, and did not noticed the symptoms until they pointed out.  He does not recall any recent trauma or injury to his foot.  Patient has a known ulceration of the lateral aspect of the right foot that he states has been there for years.  He had just recently reestablished care and have been being followed by Dr. March Rummage of podiatry.  He was made a urgent appointment at his podiatrist's office and was seen by Dr. Daylene Katayama.  X-rays were obtained which showed [name] signs of osseous involvement, but he was sent to the emergency department for need of IV antibiotics.    Assessment & Plan:   Principal Problem:   Sepsis ([place]) Active Problems:   Diabetes mellitus type 1 ([place])   ESRD (end stage renal disease) on dialysis ([place])   Essential (primary) hypertension   Cellulitis of right foot   Abscess or cellulitis of toe, right   Cellulitis of right lower extremity   Right foot infection   Diabetic foot infection ([name])   Sepsis 2/2 osteomyelitis ofright foot,diabetic ulcer -On presentation, tachycardic with fever up to 104.1 F, WBC 25.2, and lactic acid 2.03 -Continue empiric antibiotics of vancomycin and Zosyn -ABI unable to obtain due to non-compressible arteries likely due to calcification  -Consulted Dr. March Rummage, underwent bedside I&D right foot abscess  -MRI revealed osteomyelitis  -Pending surgery Sunday   ESRD on HD -Nephrology consulted  Concern for STD -HIV NR  -GC/Chlamydia negative   Diabetes mellitus type1 -Ha1c 6.2    -Continue Lantus/NovoLog and sliding scale insulin. Dose adjusted today.   Essential hypertension -Continue amlodipine, losartan  Anxiety -Continue Klonopintwice a day prn  Intractable nausea -Improved continue reglan, PPI   Diarrhea -After receiving kayexalate. GI PCR negative. [name] further diarrhea   DVT prophylaxis: subq hep Code Status: Full Family Communication: [name] family at bedside Disposition Plan: Pending improvement    Consultants:   Podiatry  Procedures:   Bedside I&D right foot abscess 1/30 Dr. Price   Antimicrobials:  Anti-infectives (From admission, onward)   Start     Dose/Rate Route Frequency Ordered Stop   10/29/17 1200  vancomycin (VANCOCIN) IVPB 750 mg/150 ml premix     750 mg 150 mL/hr over 60 Minutes Intravenous Every M-W-F (Hemodialysis) 10/27/17 1158     10/29/17 1116  Vancomycin (VANCOCIN) 750-5 MG/150ML-% IVPB  Status:  Discontinued    Comments:  Zhao, Xiaobo   : cabinet override      10/29/17 1116 10/29/17 1119   10/29/17 0000  ciprofloxacin (CIPRO) 500 MG tablet     500 mg Oral 2 times daily 10/29/17 1159 11/12/17 2359   10/29/17 0000  clindamycin (CLEOCIN) 300 MG capsule     30 0 mg Oral 4 times daily 10/29/17 1159 11/12/17 2359   10/27/17 1200  vancomycin (VANCOCIN) IVPB 1000 mg/200 mL premix  Status:  Discontinued     1,000 mg 200 mL/hr over 60 Minutes Intravenous Every M-W-F (Hemodialysis) 10/25/17 2120 10/27/17 1158  10/26/17 0800  piperacillin-tazobactam (ZOSYN) IVPB 3.375 g     3.375 g 12.5 mL/hr over 240 Minutes Intravenous Every 12 hours 10/25/17 2117     10/25/17 2015  piperacillin-tazobactam (ZOSYN) IVPB 3.375 g     3.375 g 100 mL/hr over 30 Minutes Intravenous  Once 10/25/17 2012 10/25/17 2100   10/25/17 2015  vancomycin (VANCOCIN) IVPB 1000 mg/200 mL premix     1,000 mg 200 mL/hr over 60 Minutes Intravenous  Once 10/25/17 2012 10/25/17 2126       Subjective: Seen in HD. Very frustrated and wants to go home. States  he's leaving AMA after HD and wants to come back for surgery.   Objective: Vitals:   10/29/17 1300 10/29/17 1330 10/29/17 1400 10/29/17 1500  BP: (!) 155/73 131/89 139/82 (!) 141/84  Pulse: 98 (!) 107 98 (!) 105  Resp: 20 20 20 20   Temp:   98.2 F (36.8 C) 98.3 F (36.8 C)  TempSrc:   Oral Oral  SpO2:   99% 100%  Weight:   70.4 kg (155 lb 3.3 oz)   Height:        Intake/Output Summary (Last 24 hours) at 10/29/2017 1916 Last data filed at 10/29/2017 1348 Gross per 24 hour  Intake 360 ml  Output 5287 ml  Net -4927 ml   Filed Weights   10/27/17 1213 10/29/17 0930 10/29/17 1400  Weight: 71 kg (156 lb 8.4 oz) 76.1 kg (167 lb 12.3 oz) 70.4 kg (155 lb 3.3 oz)    Examination:  General exam: Appears calm and comfortable  Respiratory system: Clear to auscultation. Respiratory effort normal. Cardiovascular system: S1 & S2 heard, RRR. [name] JVD, murmurs, rubs, gallops or clicks. [name] pedal edema. Gastrointestinal system: Abdomen is nondistended, soft and nontender. [name] organomegaly or masses felt. Normal bowel sounds heard. Central nervous system: Alert and oriented. [name] focal neurological deficits. Extremities: Symmetric 5 x 5 power. Skin: right foot dressed after I&D, clean and dry  Psychiatry: Judgement and insight appear poor   Data Reviewed: I have personally reviewed following labs and imaging studies  CBC: Recent Labs  Lab 10/25/17 1800 10/26/17 0421 10/27/17 0255 10/28/17 0759 10/29/17 0543  WBC 24.7* 25.2* 24.8* 21.7* 15.2*  NEUTROABS 20.0*  --   --   --   --   HGB 10.3* 10.3* 10.1* 9.9* 9.2*  HCT 31.3* 31.9* 30.6* 30.4* 28.6*  MCV 94.3 95.5 93.6 93.5 93.2  PLT 186 198 213 245 086   Basic Metabolic Panel: Recent Labs  Lab 10/25/17 1800 10/26/17 0421 10/27/17 0255 10/28/17 0221 10/29/17 0543  NA 130* 130* 128* 129* 132*  K 4.6 5.4* 4.8 4.5 4.2  CL 89* 90* 91* 93* 96*  CO2 20* 19* 17* 20* 23  GLUCOSE 189* 383* 352* 416* 172*  BUN 19 28* 36* 22* 36*  CREATININE  5.81* 6.96* 8.74* 5.94* 8.09*  CALCIUM 8.8* 9.0 9.3 9.0 8.8*  MG  --   --  2.3  --   --   PHOS  --   --  5.3* 3.0  --    GFR: Estimated Creatinine Clearance: 11.2 mL/min (A) (by C-G formula based on SCr of 8.09 mg/dL (H)). Liver Function Tests: Recent Labs  Lab 10/25/17 1800 10/28/17 0221  AST 18  --   ALT 10*  --   ALKPHOS 114  --   BILITOT 2.0*  --   PROT 7.1  --   ALBUMIN 3.4* 3.0*   [name] results for input(s): LIPASE, AMYLASE in  the last 168 hours. [name] results for input(s): AMMONIA in the last 168 hours. Coagulation Profile: Recent Labs  Lab 10/25/17 1800  INR 1.16   Cardiac Enzymes: [name] results for input(s): CKTOTAL, CKMB, CKMBINDEX, TROPONINI in the last 168 hours. BNP (last 3 results) [name] results for input(s): PROBNP in the last 8760 hours. HbA1C: [name] results for input(s): HGBA1C in the last 72 hours. CBG: Recent Labs  Lab 10/28/17 2148 10/29/17 0630 10/29/17 0850 10/29/17 1451 10/29/17 1705  GLUCAP 241* 163* 117* 99 340*   Lipid Profile: [name] results for input(s): CHOL, HDL, LDLCALC, TRIG, CHOLHDL, LDLDIRECT in the last 72 hours. Thyroid Function Tests: [name] results for input(s): TSH, T4TOTAL, FREET4, T3FREE, THYROIDAB in the last 72 hours. Anemia Panel: [name] results for input(s): VITAMINB12, FOLATE, FERRITIN, TIBC, IRON, RETICCTPCT in the last 72 hours. Sepsis Labs: Recent Labs  Lab 10/25/17 1814 10/25/17 1958  LATICACIDVEN 2.03* 1.27    Recent Results (from the past 240 hour(s))  Culture, blood (Routine x 2)     Status: None (Preliminary result)   Collection Time: 10/25/17  6:00 PM  Result Value Ref Range Status   Specimen Description BLOOD BLOOD LEFT FOREARM  Final   Special Requests   Final    BOTTLES DRAWN AEROBIC AND ANAEROBIC Blood Culture adequate volume   Culture   Final    [name] GROWTH 4 DAYS Performed at [facility], [place] [address], [place], [place] [address]    Report Status PENDING  Incomplete  Culture, blood (Routine x 2)     Status:  None (Preliminary result)   Collection Time: 10/25/17  7:45 PM  Result Value Ref Range Status   Specimen Description BLOOD LEFT HAND  Final   Special Requests IN PEDIATRIC BOTTLE Blood Culture adequate volume  Final   Culture   Final    [name] GROWTH 4 DAYS Performed at [facility], [place] [address] [place], [place] [address]    Report Status PENDING  Incomplete  Gastrointestinal Panel by PCR , Stool     Status: None   Collection Time: 10/26/17  1:00 PM  Result Value Ref Range Status   Campylobacter species NOT DETECTED NOT DETECTED Final   Plesimonas shigelloides NOT DETECTED NOT DETECTED Final   Salmonella species NOT DETECTED NOT DETECTED Final   Yersinia enterocolitica NOT DETECTED NOT DETECTED Final   Vibrio species NOT DETECTED NOT DETECTED Final   Vibrio cholerae NOT DETECTED NOT DETECTED Final   Enteroaggregative E coli (EAEC) NOT DETECTED NOT DETECTED Final   Enteropathogenic E coli (EPEC) NOT DETECTED NOT DETECTED Final   Enterotoxigenic E coli (ETEC) NOT DETECTED NOT DETECTED Final   Shiga like toxin producing E coli (STEC) NOT DETECTED NOT DETECTED Final   Shigella/Enteroinvasive E coli (EIEC) NOT DETECTED NOT DETECTED Final   Cryptosporidium NOT DETECTED NOT DETECTED Final   Cyclospora cayetanensis NOT DETECTED NOT DETECTED Final   Entamoeba histolytica NOT DETECTED NOT DETECTED Final   Giardia lamblia NOT DETECTED NOT DETECTED Final   Adenovirus F40/41 NOT DETECTED NOT DETECTED Final   Astrovirus NOT DETECTED NOT DETECTED Final   Norovirus GI/GII NOT DETECTED NOT DETECTED Final   Rotavirus A NOT DETECTED NOT DETECTED Final   Sapovirus (I, II, IV, and V) NOT DETECTED NOT DETECTED Final    Comment: Performed at [facility], [address], [place], [place] [address]  MRSA PCR Screening     Status: Abnormal   Collection Time: 10/26/17  5:22 PM  Result  Value Ref Range Status   MRSA by PCR INVALID RESULTS, SPECIMEN SENT FOR CULTURE (A) NEGATIVE Final     Comment:        The GeneXpert MRSA Assay (FDA approved for NASAL specimens only), is one component of a comprehensive MRSA colonization surveillance program. It is not intended to diagnose MRSA infection nor to guide or monitor treatment for MRSA infections. RESULT CALLED TO, READ BACK BY AND VERIFIED WITH: RN Nona Dell [phone] MLM   MRSA culture     Status: Abnormal (Preliminary result)   Collection Time: 10/26/17  8:00 PM  Result Value Ref Range Status   Specimen Description NASAL SWAB  Final   Special Requests NONE  Final   Culture (A)  Final    STAPHYLOCOCCUS AUREUS SUSCEPTIBILITIES TO FOLLOW    Report Status PENDING  Incomplete  MRSA PCR Screening     Status: None   Collection Time: 10/28/17 10:25 AM  Result Value Ref Range Status   MRSA by PCR NEGATIVE NEGATIVE Final    Comment:        The GeneXpert MRSA Assay (FDA approved for NASAL specimens only), is one component of a comprehensive MRSA colonization surveillance program. It is not intended to diagnose MRSA infection nor to guide or monitor treatment for MRSA infections.        Radiology Studies: Mr Foot Right Wo Contrast  Result Date: 10/29/2017 CLINICAL DATA:  Foot swelling, diabetes. Erythema and discoloration. Cellulitis. EXAM: MRI OF THE RIGHT FOREFOOT WITHOUT CONTRAST TECHNIQUE: Multiplanar, multisequence MR imaging of the right forefoot was performed. [name] intravenous contrast was administered. COMPARISON:  Radiographs from 10/26/2017 FINDINGS: Bones/Joint/Cartilage Abnormal marrow edema signal in the proximal phalanx small toe compatible with active osteomyelitis. Ligaments Lisfranc ligament intact. Muscles and Tendons Low-level edema tracking along the plantar musculature of the foot favors neurogenic edema over myositis. Soft tissues Ulceration along the plantar lateral margin of the fifth MTP joint. Surrounding cellulitis noted. Ulceration along the plantar lateral side of the base of the great toe  with extensive regional cellulitis which also tracks along the ball of the foot. Difficult to exclude gas in the soft tissues along the lateral base of the great toe. Localized fluid without an overt drainable abscess. IMPRESSION: 1. 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 2. Ulceration along the plantar lateral side of the base of the great toe with extensive focal cellulitis tracking in the great toe, and also some cellulitis tracking along the ball of the foot. Possible gas in the soft tissues along the lateral base of the great toe. [name] drainable abscess seen. Electronically Signed   By: Van Clines M.D.   On: 10/29/2017 08:38      Scheduled Meds: . amLODipine  10 mg Oral QHS  . cinacalcet  30 mg Oral Q supper  . [START ON 11/05/2017] darbepoetin (ARANESP) injection - DIALYSIS  60 mcg Intravenous Q Fri-HD  . heparin  5,000 Units Subcutaneous Q8H  . insulin aspart  0-5 Units Subcutaneous QHS  . insulin aspart  0-9 Units Subcutaneous TID WC  . insulin aspart  8 Units Subcutaneous TID AC  . insulin glargine  20 Units Subcutaneous QHS  . losartan  50 mg Oral QHS  . metoCLOPramide (REGLAN) injection  5 mg Intravenous Q8H  . multivitamin  1 tablet Oral QHS  . pantoprazole  40 mg Oral Daily  .  sevelamer carbonate  3,200 mg Oral TID WC   Continuous Infusions: . piperacillin-tazobactam (ZOSYN)  IV Stopped (10/29/17 0023)  . vancomycin 750 mg (10/29/17 1253)     LOS: 4 days    Time spent: 30 minutes   Dessa Phi, DO [facility] [email or web address] Password [facility] 10/29/2017, 7:16 PM

## 2017-10-30 DIAGNOSIS — M86171 Other acute osteomyelitis, right ankle and foot: Secondary | ICD-10-CM | POA: Diagnosis present

## 2017-10-30 LAB — CULTURE, BLOOD (ROUTINE X 2)
Culture: NO GROWTH
Culture: NO GROWTH
Special Requests: ADEQUATE
Special Requests: ADEQUATE

## 2017-10-30 LAB — GLUCOSE, CAPILLARY
Glucose-Capillary: 101 mg/dL — ABNORMAL HIGH (ref 65–99)
Glucose-Capillary: 129 mg/dL — ABNORMAL HIGH (ref 65–99)
Glucose-Capillary: 166 mg/dL — ABNORMAL HIGH (ref 65–99)
Glucose-Capillary: 167 mg/dL — ABNORMAL HIGH (ref 65–99)
Glucose-Capillary: 181 mg/dL — ABNORMAL HIGH (ref 65–99)
Glucose-Capillary: 248 mg/dL — ABNORMAL HIGH (ref 65–99)
Glucose-Capillary: 294 mg/dL — ABNORMAL HIGH (ref 65–99)
Glucose-Capillary: 313 mg/dL — ABNORMAL HIGH (ref 65–99)

## 2017-10-30 LAB — CBC
HCT: 30.8 % — ABNORMAL LOW (ref 39.0–52.0)
Hemoglobin: 10 g/dL — ABNORMAL LOW (ref 13.0–17.0)
MCH: 30.9 pg (ref 26.0–34.0)
MCHC: 32.5 g/dL (ref 30.0–36.0)
MCV: 95.1 fL (ref 78.0–100.0)
Platelets: 278 10*3/uL (ref 150–400)
RBC: 3.24 MIL/uL — ABNORMAL LOW (ref 4.22–5.81)
RDW: 14 % (ref 11.5–15.5)
WBC: 13.9 10*3/uL — ABNORMAL HIGH (ref 4.0–10.5)

## 2017-10-30 LAB — BASIC METABOLIC PANEL
Anion gap: 13 (ref 5–15)
BUN: 23 mg/dL — ABNORMAL HIGH (ref 6–20)
CO2: 27 mmol/L (ref 22–32)
Calcium: 8.7 mg/dL — ABNORMAL LOW (ref 8.9–10.3)
Chloride: 92 mmol/L — ABNORMAL LOW (ref 101–111)
Creatinine, Ser: 6.11 mg/dL — ABNORMAL HIGH (ref 0.61–1.24)
GFR calc Af Amer: 12 mL/min — ABNORMAL LOW (ref >60)
GFR calc non Af Amer: 11 mL/min — ABNORMAL LOW (ref >60)
Glucose, Bld: 314 mg/dL — ABNORMAL HIGH (ref 65–99)
Potassium: 4.3 mmol/L (ref 3.5–5.1)
Sodium: 132 mmol/L — ABNORMAL LOW (ref 135–145)

## 2017-10-30 LAB — MRSA CULTURE

## 2017-10-30 MED ORDER — INSULIN GLARGINE 100 UNIT/ML ~~LOC~~ SOLN
25.0000 [IU] | Freq: Every day | SUBCUTANEOUS | Status: DC
  Administered 2017-10-30 – 2017-11-01 (×3): 25 [IU] via SUBCUTANEOUS
  Filled 2017-10-30 (×3): qty 0.25

## 2017-10-30 MED ORDER — DARBEPOETIN ALFA 60 MCG/0.3ML IJ SOSY: 60.0000 ug | PREFILLED_SYRINGE | INTRAMUSCULAR | Status: DC

## 2017-10-30 MED ORDER — PIPERACILLIN-TAZOBACTAM 3.375 G IVPB
3.3750 g | Freq: Two times a day (BID) | INTRAVENOUS | Status: DC
  Administered 2017-10-30 – 2017-11-02 (×8): 3.375 g via INTRAVENOUS
  Filled 2017-10-30 (×9): qty 50

## 2017-10-30 MED ORDER — VANCOMYCIN HCL IN DEXTROSE 750-5 MG/150ML-% IV SOLN
750.0000 mg | INTRAVENOUS | Status: DC
Start: 2017-11-01 — End: 2017-11-03
  Administered 2017-11-01 – 2017-11-03 (×2): 750 mg via INTRAVENOUS
  Filled 2017-10-30 (×2): qty 150

## 2017-10-30 NOTE — Progress Notes (Addendum)
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  Objective Vitals:   10/29/17 1500 10/29/17 2200 10/30/17 0200 10/30/17 0600  BP: (!) 141/84 136/81 (!) 176/82 (!) 147/88  Pulse: (!) 105 98 (!) 102 91  Resp: 20 (!) 25  20  Temp: 98.3 F (36.8 C) 99.6 F (37.6 C) 99 F (37.2 C) 98.7 F (37.1 C)  TempSrc: Oral Oral Oral Oral  SpO2: 100% 100% 100% 100%  Weight:      Height:       Filed Weights   10/27/17 1213 10/29/17 0930 10/29/17 1400  Weight: 71 kg (156 lb 8.4 oz) 76.1 kg (167 lb 12.3 oz) 70.4 kg (155 lb 3.3 oz)   Physical Exam WDWN AAM. NAD. Walking the halls Many tattoos Walking the halls Regular S1S2 No S3 Abdomen soft, no tenderness.  No LE edema Wearing Darco shoe on R foot. Wrapped.  LLE fine. Dialysis Access: RU AVG   Recent Labs  Lab 10/27/17 0255 10/28/17 0221 10/29/17 0543 10/30/17 0550  NA 128* 129* 132* 132*  K 4.8 4.5 4.2 4.3  CL 91* 93* 96* 92*  CO2 17* 20* 23 27  GLUCOSE 352* 416* 172* 314*  BUN 36* 22* 36* 23*  CREATININE 8.74* 5.94* 8.09* 6.11*  CALCIUM 9.3 9.0 8.8* 8.7*  PHOS 5.3* 3.0  --   --     Recent Labs  Lab 10/25/17 1800 10/28/17 0221  AST 18  --   ALT 10*  --   ALKPHOS 114  --   BILITOT 2.0*  --   PROT 7.1  --   ALBUMIN 3.4* 3.0*    Recent Labs  Lab 10/25/17 1800 10/26/17 0421 10/27/17 0255 10/28/17 0759 10/29/17 0543 10/30/17 0550  WBC 24.7* 25.2* 24.8* 21.7* 15.2* 13.9*  NEUTROABS 20.0*  --   --   --   --   --   HGB 10.3* 10.3* 10.1* 9.9* 9.2* 10.0*  HCT 31.3* 31.9* 30.6* 30.4* 28.6* 30.8*  MCV 94.3 95.5 93.6 93.5 93.2 95.1  PLT 186 198 213 245 261 278       Component Value  Date/Time   SDES NASAL SWAB 10/26/2017 2000   SPECREQUEST NONE 10/26/2017 2000   CULT (A) 10/26/2017 2000    STAPHYLOCOCCUS AUREUS SUSCEPTIBILITIES TO FOLLOW    REPTSTATUS PENDING 10/26/2017 2000    Recent Labs  Lab 10/29/17 1705 10/29/17 1958 10/29/17 2230 10/30/17 0033 10/30/17 0602  GLUCAP 340* 210* 71 313* 294*   Studies/Results: Mr Foot Right Wo Contrast  Result Date: 10/29/2017 CLINICAL DATA:  Foot swelling, diabetes. Erythema and discoloration. Cellulitis. EXAM: MRI OF THE RIGHT FOREFOOT WITHOUT CONTRAST TECHNIQUE: Multiplanar, multisequence MR imaging of the right forefoot was performed. No intravenous contrast was administered. COMPARISON:  Radiographs from 10/26/2017 FINDINGS: Bones/Joint/Cartilage Abnormal marrow edema signal in the proximal phalanx small toe compatible with active osteomyelitis. Ligaments Lisfranc ligament intact. Muscles and Tendons Low-level edema tracking along the plantar musculature of the foot favors neurogenic edema over myositis. Soft tissues Ulceration along the plantar lateral margin of the fifth MTP joint. Surrounding cellulitis noted. Ulceration along the plantar lateral side of the base of the great  toe with extensive regional cellulitis which also tracks along the ball of the foot. Difficult to exclude gas in the soft tissues along the lateral base of the great toe. Localized fluid without an overt drainable abscess. IMPRESSION: 1. 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 2. Ulceration along the plantar lateral side of the base of the great toe with extensive focal cellulitis tracking in the great toe, and also some cellulitis tracking along the ball of the foot. Possible gas in the soft tissues along the lateral base of the great toe. No drainable abscess seen. Electronically Signed   By: Van Clines M.D.   On:  10/29/2017 08:38    Medications: . piperacillin-tazobactam (ZOSYN)  IV 3.375 g (10/30/17 0801)  . [START ON 11/01/2017] vancomycin     . amLODipine  10 mg Oral QHS  . cinacalcet  30 mg Oral Q supper  . [START ON 11/05/2017] darbepoetin (ARANESP) injection - DIALYSIS  60 mcg Intravenous Q Fri-HD  . heparin  5,000 Units Subcutaneous Q8H  . insulin aspart  0-5 Units Subcutaneous QHS  . insulin aspart  0-9 Units Subcutaneous TID WC  . insulin aspart  8 Units Subcutaneous TID AC  . insulin glargine  25 Units Subcutaneous QHS  . losartan  50 mg Oral QHS  . metoCLOPramide (REGLAN) injection  5 mg Intravenous Q8H  . multivitamin  1 tablet Oral QHS  . pantoprazole  40 mg Oral Daily  . sevelamer carbonate  3,200 mg Oral TID [facility]    Dialysis Orders: MWF - [facility] [address] 4hrs, BFR400,  [phone],  EDW 75.5kg,Will have lower EDW at discharge 2K/2.25Ca  Access:LU AVG Heparin9000 Unit bolus  mircera 17mcg IV q2wks - last 1/22 Calcitriol 1.72mcg PO qHD   Assessment/Plan:  1. Sepsis 2/2 cellulitis of RLE, chronic diabetic ulcer - no osteo on xray 1/29. MRI 1/31 shows osteomyelitis of proximal phalanx of sm toe and possible gas in soft tissue along lateral base of great toe. On Vanc & zosyn. WBC declining. Plans are for surgery tomorrow (Sunday) if we can keep him in the hospital. 2. ESRD - MWF. Post weight 70.5 after last TMT. Will be new EDW.  3. Anemia of CKD- Aranesp 60 started with HD 2/1 4. Secondary hyperparathyroidism - Labs in goal. Continue binders, oral calcitriol and sensipar. 5. HTN - BP controlled.  6. Nutrition - Renal diet with fluid restrictions.  7. Diabetes - on insulin per primary 8. Severe gastroparesis & diabetic diarrhea - per primary  Jamal Maes, MD [place] Pager 10/30/2017, 8:42 AM

## 2017-10-30 NOTE — Progress Notes (Signed)
PROGRESS NOTE    [name]  RAX:[id number] DOB: [birth year] DOA: 10/25/2017 PCP: Patient, No Pcp Per     Brief Narrative:  [name] is a 37 y.o. male with medical history significant of DM type I, ESRD on HD, and diabetic foot ulcer followed by Dr. March Rummage; who presents with complaints of right foot swelling.  During hemodialysis, it was noticed that he had swelling and red discoloration of his right foot.  He states that he has no feeling in his feet, and did not noticed the symptoms until they pointed out.  He does not recall any recent trauma or injury to his foot.  Patient has a known ulceration of the lateral aspect of the right foot that he states has been there for years.  He had just recently reestablished care and have been being followed by Dr. March Rummage of podiatry.  He was made a urgent appointment at his podiatrist's office and was seen by Dr. Daylene Katayama.  X-rays were obtained which showed no signs of osseous involvement, but he was sent to the emergency department for need of IV antibiotics.    Assessment & Plan:   Principal Problem:   Sepsis ([place]) Active Problems:   Diabetes mellitus type 1 ([place])   ESRD (end stage renal disease) on dialysis ([place])   Essential (primary) hypertension   Cellulitis of right foot   Abscess or cellulitis of toe, right   Cellulitis of right lower extremity   Right foot infection   Diabetic foot infection ([name])   Sepsis 2/2 osteomyelitis ofright foot,diabetic ulcer -On presentation, tachycardic with fever up to 104.1 F, WBC 25.2, and lactic acid 2.03 -Continue empiric antibiotics of vancomycin and Zosyn -ABI unable to obtain due to non-compressible arteries likely due to calcification  -Consulted Dr. March Rummage, underwent bedside I&D right foot abscess  -MRI revealed osteomyelitis  -Pending surgery Sunday. NPO at midnight.   ESRD on HD -Nephrology consulted  Concern for STD -HIV NR  -GC/Chlamydia negative  -Discussed symptoms to look  out for. He is symptom free at this time but worried about exposure   Diabetes mellitus type1 -Ha1c 6.2  -Continue Lantus/NovoLog and sliding scale insulin. Dose adjusted today.   Essential hypertension -Continue amlodipine, losartan  Anxiety -Continue Klonopintwice a day prn  Intractable nausea -Improved continue reglan, PPI   Diarrhea -After receiving kayexalate. GI PCR negative. No further diarrhea   DVT prophylaxis: subq hep Code Status: Full Family Communication: No family at bedside Disposition Plan: Pending surgery 2/3    Consultants:   Podiatry  Procedures:   Bedside I&D right foot abscess 1/30 Dr. Price   Antimicrobials:  Anti-infectives (From admission, onward)   Start     Dose/Rate Route Frequency Ordered Stop   11/01/17 1200  vancomycin (VANCOCIN) IVPB 750 mg/150 ml premix     750 mg 150 mL/hr over 60 Minutes Intravenous Every M-W-F (Hemodialysis) 10/30/17 0726     10/30/17 0800  piperacillin-tazobactam (ZOSYN) IVPB 3.375 g     3.375 g 12.5 mL/hr over 240 Minutes Intravenous Every 12 hours 10/30/17 0726     10/29/17 1200  vancomycin (VANCOCIN) IVPB 750 mg/150 ml premix  Status:  Discontinued     750 mg 150 mL/hr over 60 Minutes Intravenous Every M-W-F (Hemodialysis) 10/27/17 1158 10/30/17 0726   10/29/17 1116  Vancomycin (VANCOCIN) 750-5 MG/150ML-% IVPB  Status:  Discontinued    Comments:  [name], Shane   : cabinet override      02 /01/19 1116 10/29/17 1119  10/29/17 0000  ciprofloxacin (CIPRO) 500 MG tablet     500 mg Oral 2 times daily 10/29/17 1159 11/12/17 2359   10/29/17 0000  clindamycin (CLEOCIN) 300 MG capsule     300 mg Oral 4 times daily 10/29/17 1159 11/12/17 2359   10/27/17 1200  vancomycin (VANCOCIN) IVPB 1000 mg/200 mL premix  Status:  Discontinued     1,000 mg 200 mL/hr over 60 Minutes Intravenous Every M-W-F (Hemodialysis) 10/25/17 2120 10/27/17 1158   10/26/17 0800  piperacillin-tazobactam (ZOSYN) IVPB 3.375 g  Status:   Discontinued     3.375 g 12.5 mL/hr over 240 Minutes Intravenous Every 12 hours 10/25/17 2117 10/30/17 0726   10/25/17 2015  piperacillin-tazobactam (ZOSYN) IVPB 3.375 g     3.375 g 100 mL/hr over 30 Minutes Intravenous  Once 10/25/17 2012 10/25/17 2100   10/25/17 2015  vancomycin (VANCOCIN) IVPB 1000 mg/200 mL premix     1,000 mg 200 mL/hr over 60 Minutes Intravenous  Once 10/25/17 2012 10/25/17 2126       Subjective: Frustrated that he can't leave and just come back after the [employer]. Discussed medical treatment as well as surgery which will be necessary for treatment for his osteomyelitis. States he will stay for surgical procedure tomorrow.   Objective: Vitals:   10/29/17 2200 10/30/17 0200 10/30/17 0600 10/30/17 1005  BP: 136/81 (!) 176/82 (!) 147/88 (!) 142/79  Pulse: 98 (!) 102 91 91  Resp: (!) 25  20 16   Temp: 99.6 F (37.6 C) 99 F (37.2 C) 98.7 F (37.1 C) (!) 97.4 F (36.3 C)  TempSrc: Oral Oral Oral Oral  SpO2: 100% 100% 100% 100%  Weight:      Height:        Intake/Output Summary (Last 24 hours) at 10/30/2017 1040 Last data filed at 10/30/2017 0600 Gross per 24 hour  Intake 580 ml  Output 5288 ml  Net -4708 ml   Filed Weights   10/27/17 1213 10/29/17 0930 10/29/17 1400  Weight: 71 kg (156 lb 8.4 oz) 76.1 kg (167 lb 12.3 oz) 70.4 kg (155 lb 3.3 oz)    Examination:  General exam: Appears calm and comfortable  Respiratory system: Clear to auscultation. Respiratory effort normal. Cardiovascular system: S1 & S2 heard, RRR. No JVD, murmurs, rubs, gallops or clicks. No pedal edema. Gastrointestinal system: Abdomen is nondistended, soft and nontender. No organomegaly or masses felt. Normal bowel sounds heard. Central nervous system: Alert and oriented. No focal neurological deficits. Extremities: Symmetric 5 x 5 power. Skin: right foot dressed after I&D, dried brown fluid plantar surface of dressing  Psychiatry: Judgement and insight appear poor   Data  Reviewed: I have personally reviewed following labs and imaging studies  CBC: Recent Labs  Lab 10/25/17 1800 10/26/17 0421 10/27/17 0255 10/28/17 0759 10/29/17 0543 10/30/17 0550  WBC 24.7* 25.2* 24.8* 21.7* 15.2* 13.9*  NEUTROABS 20.0*  --   --   --   --   --   HGB 10.3* 10.3* 10.1* 9.9* 9.2* 10.0*  HCT 31.3* 31.9* 30.6* 30.4* 28.6* 30.8*  MCV 94.3 95.5 93.6 93.5 93.2 95.1  PLT 186 198 213 245 261 086   Basic Metabolic Panel: Recent Labs  Lab 10/26/17 0421 10/27/17 0255 10/28/17 0221 10/29/17 0543 10/30/17 0550  NA 130* 128* 129* 132* 132*  K 5.4* 4.8 4.5 4.2 4.3  CL 90* 91* 93* 96* 92*  CO2 19* 17* 20* 23 27  GLUCOSE 383* 352* 416* 172* 314*  BUN 28* 36*  22* 36* 23*  CREATININE 6.96* 8.74* 5.94* 8.09* 6.11*  CALCIUM 9.0 9.3 9.0 8.8* 8.7*  MG  --  2.3  --   --   --   PHOS  --  5.3* 3.0  --   --    GFR: Estimated Creatinine Clearance: 14.8 mL/min (A) (by C-G formula based on SCr of 6.11 mg/dL (H)). Liver Function Tests: Recent Labs  Lab 10/25/17 1800 10/28/17 0221  AST 18  --   ALT 10*  --   ALKPHOS 114  --   BILITOT 2.0*  --   PROT 7.1  --   ALBUMIN 3.4* 3.0*   No results for input(s): LIPASE, AMYLASE in the last 168 hours. No results for input(s): AMMONIA in the last 168 hours. Coagulation Profile: Recent Labs  Lab 10/25/17 1800  INR 1.16   Cardiac Enzymes: No results for input(s): CKTOTAL, CKMB, CKMBINDEX, TROPONINI in the last 168 hours. BNP (last 3 results) No results for input(s): PROBNP in the last 8760 hours. HbA1C: No results for input(s): HGBA1C in the last 72 hours. CBG: Recent Labs  Lab 10/29/17 1958 10/29/17 2230 10/30/17 0033 10/30/17 0602 10/30/17 0915  GLUCAP 210* 71 313* 294* 166*   Lipid Profile: No results for input(s): CHOL, HDL, LDLCALC, TRIG, CHOLHDL, LDLDIRECT in the last 72 hours. Thyroid Function Tests: No results for input(s): TSH, T4TOTAL, FREET4, T3FREE, THYROIDAB in the last 72 hours. Anemia Panel: No results  for input(s): VITAMINB12, FOLATE, FERRITIN, TIBC, IRON, RETICCTPCT in the last 72 hours. Sepsis Labs: Recent Labs  Lab 10/25/17 1814 10/25/17 1958  LATICACIDVEN 2.03* 1.27    Recent Results (from the past 240 hour(s))  Culture, blood (Routine x 2)     Status: None   Collection Time: 10/25/17  6:00 PM  Result Value Ref Range Status   Specimen Description BLOOD BLOOD LEFT FOREARM  Final   Special Requests   Final    BOTTLES DRAWN AEROBIC AND ANAEROBIC Blood Culture adequate volume   Culture   Final    NO GROWTH 5 DAYS Performed at [facility], [address] [place], [place] [address]    Report Status 10/30/2017 FINAL  Final  Culture, blood (Routine x 2)     Status: None   Collection Time: 10/25/17  7:45 PM  Result Value Ref Range Status   Specimen Description BLOOD LEFT HAND  Final   Special Requests IN PEDIATRIC BOTTLE Blood Culture adequate volume  Final   Culture   Final    NO GROWTH 5 DAYS Performed at [facility], [place] [address], [place], [place] [address]    Report Status 10/30/2017 FINAL  Final  Gastrointestinal Panel by PCR , Stool     Status: None   Collection Time: 10/26/17  1:00 PM  Result Value Ref Range Status   Campylobacter species NOT DETECTED NOT DETECTED Final   Plesimonas shigelloides NOT DETECTED NOT DETECTED Final   Salmonella species NOT DETECTED NOT DETECTED Final   Yersinia enterocolitica NOT DETECTED NOT DETECTED Final   Vibrio species NOT DETECTED NOT DETECTED Final   Vibrio cholerae NOT DETECTED NOT DETECTED Final   Enteroaggregative E coli (EAEC) NOT DETECTED NOT DETECTED Final   Enteropathogenic E coli (EPEC) NOT DETECTED NOT DETECTED Final   Enterotoxigenic E coli (ETEC) NOT DETECTED NOT DETECTED Final   Shiga like toxin producing E coli (STEC) NOT DETECTED NOT DETECTED Final   Shigella/Enteroinvasive E coli (EIEC) NOT DETECTED NOT DETECTED Final   Cryptosporidium  NOT DETECTED NOT DETECTED Final   Cyclospora cayetanensis NOT  DETECTED NOT DETECTED Final   Entamoeba histolytica NOT DETECTED NOT DETECTED Final   Giardia lamblia NOT DETECTED NOT DETECTED Final   Adenovirus F40/41 NOT DETECTED NOT DETECTED Final   Astrovirus NOT DETECTED NOT DETECTED Final   Norovirus GI/GII NOT DETECTED NOT DETECTED Final   Rotavirus A NOT DETECTED NOT DETECTED Final   Sapovirus (I, II, IV, and V) NOT DETECTED NOT DETECTED Final    Comment: Performed at [facility], [address] [place], [place] [address]  MRSA PCR Screening     Status: Abnormal   Collection Time: 10/26/17  5:22 PM  Result Value Ref Range Status   MRSA by PCR INVALID RESULTS, SPECIMEN SENT FOR CULTURE (A) NEGATIVE Final    Comment:        The GeneXpert MRSA Assay (FDA approved for NASAL specimens only), is one component of a comprehensive MRSA colonization surveillance program. It is not intended to diagnose MRSA infection nor to guide or monitor treatment for MRSA infections. RESULT CALLED TO, READ BACK BY AND VERIFIED WITH: RN Nona Dell [id number]   MRSA culture     Status: Abnormal (Preliminary result)   Collection Time: 10/26/17  8:00 PM  Result Value Ref Range Status   Specimen Description NASAL SWAB  Final   Special Requests NONE  Final   Culture (A)  Final    STAPHYLOCOCCUS AUREUS SUSCEPTIBILITIES TO FOLLOW    Report Status PENDING  Incomplete  MRSA PCR Screening     Status: None   Collection Time: 10/28/17 10:25 AM  Result Value Ref Range Status   MRSA by PCR NEGATIVE NEGATIVE Final    Comment:        The GeneXpert MRSA Assay (FDA approved for NASAL specimens only), is one component of a comprehensive MRSA colonization surveillance program. It is not intended to diagnose MRSA infection nor to guide or monitor treatment for MRSA infections.        Radiology Studies: Mr Foot Right Wo Contrast  Result Date: 10/29/2017 CLINICAL DATA:  Foot swelling, diabetes. Erythema and discoloration. Cellulitis. EXAM: MRI OF  THE RIGHT FOREFOOT WITHOUT CONTRAST TECHNIQUE: Multiplanar, multisequence MR imaging of the right forefoot was performed. No intravenous contrast was administered. COMPARISON:  Radiographs from 10/26/2017 FINDINGS: Bones/Joint/Cartilage Abnormal marrow edema signal in the proximal phalanx small toe compatible with active osteomyelitis. Ligaments Lisfranc ligament intact. Muscles and Tendons Low-level edema tracking along the plantar musculature of the foot favors neurogenic edema over myositis. Soft tissues Ulceration along the plantar lateral margin of the fifth MTP joint. Surrounding cellulitis noted. Ulceration along the plantar lateral side of the base of the great toe with extensive regional cellulitis which also tracks along the ball of the foot. Difficult to exclude gas in the soft tissues along the lateral base of the great toe. Localized fluid without an overt drainable abscess. IMPRESSION: 1. 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 2. Ulceration along the plantar lateral side of the base of the great toe with extensive focal cellulitis tracking in the great toe, and also some cellulitis tracking along the ball of the foot. Possible gas in the soft tissues along the lateral base of the great toe. No drainable abscess seen. Electronically Signed   By: Van Clines M.D.   On: 10/29/2017 08:38  Scheduled Meds: . amLODipine  10 mg Oral QHS  . cinacalcet  30 mg Oral Q supper  . [START ON 11/05/2017] darbepoetin (ARANESP) injection - DIALYSIS  60 mcg Intravenous Q Fri-HD  . heparin  5,000 Units Subcutaneous Q8H  . insulin aspart  0-5 Units Subcutaneous QHS  . insulin aspart  0-9 Units Subcutaneous TID WC  . insulin aspart  8 Units Subcutaneous TID AC  . insulin glargine  25 Units Subcutaneous QHS  . losartan  50 mg Oral QHS  . metoCLOPramide (REGLAN) injection  5  mg Intravenous Q8H  . multivitamin  1 tablet Oral QHS  . pantoprazole  40 mg Oral Daily  . sevelamer carbonate  3,200 mg Oral TID WC   Continuous Infusions: . piperacillin-tazobactam (ZOSYN)  IV 3.375 g (10/30/17 0801)  . [START ON 11/01/2017] vancomycin       LOS: 5 days    Time spent: 30 minutes   Dessa Phi, DO [facility] [email or web address] Password [email or web address] 10/30/2017, 10:40 AM

## 2017-10-30 NOTE — Progress Notes (Signed)
Pt, ambulatory refused heparin Sq and cozaar   Pt educated on the importance to  Takes his medication but refused.

## 2017-10-31 ENCOUNTER — Encounter (HOSPITAL_COMMUNITY): Payer: Self-pay | Admitting: Certified Registered Nurse Anesthetist

## 2017-10-31 ENCOUNTER — Inpatient Hospital Stay (HOSPITAL_COMMUNITY): Payer: Medicare Other | Admitting: Anesthesiology

## 2017-10-31 ENCOUNTER — Encounter (HOSPITAL_COMMUNITY)
Admission: EM | Disposition: A | Discharge status: Home / Self Care | Payer: Self-pay | Source: Home / Self Care | Attending: Internal Medicine

## 2017-10-31 DIAGNOSIS — L97511 Non-pressure chronic ulcer of other part of right foot limited to breakdown of skin: Secondary | ICD-10-CM

## 2017-10-31 DIAGNOSIS — M86171 Other acute osteomyelitis, right ankle and foot: Secondary | ICD-10-CM

## 2017-10-31 HISTORY — PX: I&D EXTREMITY: SHX5045

## 2017-10-31 LAB — BASIC METABOLIC PANEL
Anion gap: 14 (ref 5–15)
BUN: 39 mg/dL — ABNORMAL HIGH (ref 6–20)
CO2: 23 mmol/L (ref 22–32)
Calcium: 8.9 mg/dL (ref 8.9–10.3)
Chloride: 93 mmol/L — ABNORMAL LOW (ref 101–111)
Creatinine, Ser: 7.95 mg/dL — ABNORMAL HIGH (ref 0.61–1.24)
GFR calc Af Amer: 9 mL/min — ABNORMAL LOW (ref >60)
GFR calc non Af Amer: 8 mL/min — ABNORMAL LOW (ref >60)
Glucose, Bld: 262 mg/dL — ABNORMAL HIGH (ref 65–99)
Potassium: 4.8 mmol/L (ref 3.5–5.1)
Sodium: 130 mmol/L — ABNORMAL LOW (ref 135–145)

## 2017-10-31 LAB — RENAL FUNCTION PANEL
Albumin: 2.7 g/dL — ABNORMAL LOW (ref 3.5–5.0)
Anion gap: 14 (ref 5–15)
BUN: 40 mg/dL — ABNORMAL HIGH (ref 6–20)
CO2: 24 mmol/L (ref 22–32)
Calcium: 8.9 mg/dL (ref 8.9–10.3)
Chloride: 92 mmol/L — ABNORMAL LOW (ref 101–111)
Creatinine, Ser: 7.93 mg/dL — ABNORMAL HIGH (ref 0.61–1.24)
GFR calc Af Amer: 9 mL/min — ABNORMAL LOW (ref >60)
GFR calc non Af Amer: 8 mL/min — ABNORMAL LOW (ref >60)
Glucose, Bld: 261 mg/dL — ABNORMAL HIGH (ref 65–99)
Phosphorus: 4.1 mg/dL (ref 2.5–4.6)
Potassium: 4.8 mmol/L (ref 3.5–5.1)
Sodium: 130 mmol/L — ABNORMAL LOW (ref 135–145)

## 2017-10-31 LAB — GLUCOSE, CAPILLARY
Glucose-Capillary: 165 mg/dL — ABNORMAL HIGH (ref 65–99)
Glucose-Capillary: 132 mg/dL — ABNORMAL HIGH (ref 65–99)
Glucose-Capillary: 116 mg/dL — ABNORMAL HIGH (ref 65–99)
Glucose-Capillary: 130 mg/dL — ABNORMAL HIGH (ref 65–99)
Glucose-Capillary: 228 mg/dL — ABNORMAL HIGH (ref 65–99)
Glucose-Capillary: 183 mg/dL — ABNORMAL HIGH (ref 65–99)

## 2017-10-31 LAB — CBC
HCT: 28.5 % — ABNORMAL LOW (ref 39.0–52.0)
Hemoglobin: 9.3 g/dL — ABNORMAL LOW (ref 13.0–17.0)
MCH: 31.1 pg (ref 26.0–34.0)
MCHC: 32.6 g/dL (ref 30.0–36.0)
MCV: 95.3 fL (ref 78.0–100.0)
Platelets: 302 10*3/uL (ref 150–400)
RBC: 2.99 MIL/uL — ABNORMAL LOW (ref 4.22–5.81)
RDW: 14.3 % (ref 11.5–15.5)
WBC: 15.1 10*3/uL — ABNORMAL HIGH (ref 4.0–10.5)

## 2017-10-31 SURGERY — IRRIGATION AND DEBRIDEMENT EXTREMITY
Anesthesia: Monitor Anesthesia Care | Site: Foot | Laterality: Right

## 2017-10-31 MED ORDER — MIDAZOLAM HCL 2 MG/2ML IJ SOLN
INTRAMUSCULAR | Status: AC
  Filled 2017-10-31: qty 2

## 2017-10-31 MED ORDER — BUPIVACAINE HCL (PF) 0.5 % IJ SOLN
INTRAMUSCULAR | Status: AC
  Filled 2017-10-31: qty 30

## 2017-10-31 MED ORDER — ONDANSETRON HCL 4 MG/2ML IJ SOLN
INTRAMUSCULAR | Status: AC
  Filled 2017-10-31: qty 2

## 2017-10-31 MED ORDER — VANCOMYCIN HCL 1000 MG IV SOLR
INTRAVENOUS | Status: AC
  Filled 2017-10-31: qty 1000

## 2017-10-31 MED ORDER — PROPOFOL 10 MG/ML IV BOLUS
INTRAVENOUS | Status: DC | PRN
  Administered 2017-10-31: 10 mg via INTRAVENOUS

## 2017-10-31 MED ORDER — HYDROMORPHONE HCL 1 MG/ML IJ SOLN
0.5000 mg | INTRAMUSCULAR | Status: DC | PRN
  Administered 2017-10-31 – 2017-11-03 (×14): 0.5 mg via INTRAVENOUS
  Filled 2017-10-31 (×15): qty 0.5

## 2017-10-31 MED ORDER — MIDAZOLAM HCL 5 MG/5ML IJ SOLN
INTRAMUSCULAR | Status: DC | PRN
  Administered 2017-10-31: 2 mg via INTRAVENOUS

## 2017-10-31 MED ORDER — 0.9 % SODIUM CHLORIDE (POUR BTL) OPTIME
TOPICAL | Status: DC | PRN
  Administered 2017-10-31: 1000 mL

## 2017-10-31 MED ORDER — ONDANSETRON HCL 4 MG/2ML IJ SOLN
INTRAMUSCULAR | Status: DC | PRN
  Administered 2017-10-31: 4 mg via INTRAVENOUS

## 2017-10-31 MED ORDER — FENTANYL CITRATE (PF) 100 MCG/2ML IJ SOLN: 25.0000 ug | INTRAMUSCULAR | Status: DC | PRN

## 2017-10-31 MED ORDER — MEPERIDINE HCL 25 MG/ML IJ SOLN: 6.2500 mg | INTRAMUSCULAR | Status: DC | PRN

## 2017-10-31 MED ORDER — FENTANYL CITRATE (PF) 100 MCG/2ML IJ SOLN
INTRAMUSCULAR | Status: DC | PRN
  Administered 2017-10-31: 50 ug via INTRAVENOUS

## 2017-10-31 MED ORDER — MIDAZOLAM HCL 2 MG/2ML IJ SOLN: 0.5000 mg | Freq: Once | INTRAMUSCULAR | Status: DC | PRN

## 2017-10-31 MED ORDER — SODIUM CHLORIDE 0.9 % IV SOLN
INTRAVENOUS | Status: DC | PRN
  Administered 2017-10-31: 11:00:00 via INTRAVENOUS

## 2017-10-31 MED ORDER — PROPOFOL 500 MG/50ML IV EMUL
INTRAVENOUS | Status: DC | PRN
  Administered 2017-10-31: 100 ug/kg/min via INTRAVENOUS

## 2017-10-31 MED ORDER — LIDOCAINE 2% (20 MG/ML) 5 ML SYRINGE
INTRAMUSCULAR | Status: DC | PRN
  Administered 2017-10-31: 40 mg via INTRAVENOUS

## 2017-10-31 MED ORDER — LIDOCAINE 2% (20 MG/ML) 5 ML SYRINGE
INTRAMUSCULAR | Status: AC
  Filled 2017-10-31: qty 5

## 2017-10-31 MED ORDER — FENTANYL CITRATE (PF) 250 MCG/5ML IJ SOLN
INTRAMUSCULAR | Status: AC
  Filled 2017-10-31: qty 5

## 2017-10-31 MED ORDER — BUPIVACAINE HCL (PF) 0.5 % IJ SOLN
INTRAMUSCULAR | Status: DC | PRN
  Administered 2017-10-31: 10 mL

## 2017-10-31 MED ORDER — VANCOMYCIN HCL 1000 MG IV SOLR
INTRAVENOUS | Status: DC | PRN
  Administered 2017-10-31: 1000 mg via TOPICAL

## 2017-10-31 MED ORDER — PROMETHAZINE HCL 25 MG/ML IJ SOLN: 6.2500 mg | INTRAMUSCULAR | Status: DC | PRN

## 2017-10-31 MED ORDER — PROPOFOL 10 MG/ML IV BOLUS
INTRAVENOUS | Status: AC
  Filled 2017-10-31: qty 20

## 2017-10-31 MED ORDER — SODIUM CHLORIDE 0.9 % IR SOLN
Status: DC | PRN
  Administered 2017-10-31: 1000 mL

## 2017-10-31 SURGICAL SUPPLY — 46 items
BANDAGE ACE 4X5 VEL STRL LF (GAUZE/BANDAGES/DRESSINGS) ×3 IMPLANT
BNDG ESMARK 4X9 LF (GAUZE/BANDAGES/DRESSINGS) ×3 IMPLANT
BNDG GAUZE ELAST 4 BULKY (GAUZE/BANDAGES/DRESSINGS) ×3 IMPLANT
CHLORAPREP W/TINT 26ML (MISCELLANEOUS) IMPLANT
COVER SURGICAL LIGHT HANDLE (MISCELLANEOUS) ×3 IMPLANT
CUFF TOURNIQUET SINGLE 18IN (TOURNIQUET CUFF) ×3 IMPLANT
CUFF TOURNIQUET SINGLE 34IN LL (TOURNIQUET CUFF) IMPLANT
DRAPE U-SHAPE 47X51 STRL (DRAPES) ×3 IMPLANT
ELECT CAUTERY BLADE 6.4 (BLADE) ×3 IMPLANT
ELECT REM PT RETURN 9FT ADLT (ELECTROSURGICAL) ×3
ELECTRODE REM PT RTRN 9FT ADLT (ELECTROSURGICAL) ×1 IMPLANT
GAUZE PACKING IODOFORM 1/4X15 (GAUZE/BANDAGES/DRESSINGS) ×3 IMPLANT
GAUZE SPONGE 4X4 12PLY STRL (GAUZE/BANDAGES/DRESSINGS) IMPLANT
GAUZE SPONGE 4X4 12PLY STRL LF (GAUZE/BANDAGES/DRESSINGS) ×3 IMPLANT
GLOVE BIO SURGEON STRL SZ7.5 (GLOVE) ×3 IMPLANT
GLOVE BIOGEL PI IND STRL 7.0 (GLOVE) ×4 IMPLANT
GLOVE BIOGEL PI IND STRL 8 (GLOVE) ×1 IMPLANT
GLOVE BIOGEL PI INDICATOR 7.0 (GLOVE) ×8
GLOVE BIOGEL PI INDICATOR 8 (GLOVE) ×2
GLOVE SKINSENSE NS SZ6.5 (GLOVE) ×4
GLOVE SKINSENSE STRL SZ6.5 (GLOVE) ×2 IMPLANT
GOWN STRL REUS W/ TWL LRG LVL3 (GOWN DISPOSABLE) ×2 IMPLANT
GOWN STRL REUS W/ TWL XL LVL3 (GOWN DISPOSABLE) ×1 IMPLANT
GOWN STRL REUS W/TWL LRG LVL3 (GOWN DISPOSABLE) ×4
GOWN STRL REUS W/TWL XL LVL3 (GOWN DISPOSABLE) ×2
KIT BASIN OR (CUSTOM PROCEDURE TRAY) ×3 IMPLANT
KIT ROOM TURNOVER OR (KITS) ×3 IMPLANT
MANIFOLD NEPTUNE II (INSTRUMENTS) ×3 IMPLANT
NEEDLE BIOPSY JAMSHIDI 8X6 (NEEDLE) IMPLANT
NEEDLE HYPO 25GX1X1/2 BEV (NEEDLE) IMPLANT
NS IRRIG 1000ML POUR BTL (IV SOLUTION) ×3 IMPLANT
PACK ORTHO EXTREMITY (CUSTOM PROCEDURE TRAY) ×3 IMPLANT
PAD ARMBOARD 7.5X6 YLW CONV (MISCELLANEOUS) ×6 IMPLANT
PROBE DEBRIDE SONICVAC MISONIX (TIP) ×3 IMPLANT
SCRUB BETADINE 4OZ XXX (MISCELLANEOUS) ×3 IMPLANT
SET CYSTO W/LG BORE CLAMP LF (SET/KITS/TRAYS/PACK) IMPLANT
SOL PREP POV-IOD 4OZ 10% (MISCELLANEOUS) ×3 IMPLANT
SUCTION FRAZIER HANDLE 10FR (MISCELLANEOUS) ×2
SUCTION TUBE FRAZIER 10FR DISP (MISCELLANEOUS) ×1 IMPLANT
SYR CONTROL 10ML LL (SYRINGE) IMPLANT
TOWEL GREEN STERILE (TOWEL DISPOSABLE) ×3 IMPLANT
TOWEL GREEN STERILE FF (TOWEL DISPOSABLE) ×3 IMPLANT
TUBE CONNECTING 12'X1/4 (SUCTIONS) ×1
TUBE CONNECTING 12X1/4 (SUCTIONS) ×2 IMPLANT
TUBE IRRIGATION SET MISONIX (TUBING) ×3 IMPLANT
YANKAUER SUCT BULB TIP NO VENT (SUCTIONS) ×3 IMPLANT

## 2017-10-31 NOTE — Brief Op Note (Signed)
10/31/2017  11:47 AM  PATIENT:  [name]  37 y.o. male  PRE-OPERATIVE DIAGNOSIS:  Right Foot infection  POST-OPERATIVE DIAGNOSIS:  Right foot infection  PROCEDURE:  Procedure(s): IRRIGATION AND DEBRIDEMENT RIGHT FOOT (Right)  SURGEON:  Surgeon(s) and Role:    Evelina Bucy, DPM - Primary  PHYSICIAN ASSISTANT:   ASSISTANTS: none   ANESTHESIA:   IV sedation and MAC  EBL:  20 mL   BLOOD ADMINISTERED:none  DRAINS: none   LOCAL MEDICATIONS USED:  MARCAINE    and Amount: 10 ml  SPECIMEN:  Source of Specimen:  Deep soft tissue, deep swab culture  DISPOSITION OF SPECIMEN:  micro  COUNTS:  YES  TOURNIQUET:   Total Tourniquet Time Documented: Calf (Right) - 20 minutes Total: Calf (Right) - 20 minutes   DICTATION: .Note written in EPIC  PLAN OF CARE: transfer back to floor  PATIENT DISPOSITION:  PACU - hemodynamically stable.   Delay start of Pharmacological VTE agent (>24hrs) due to surgical blood loss or risk of bleeding: not applicable

## 2017-10-31 NOTE — Transfer of Care (Signed)
Immediate Anesthesia Transfer of Care Note  Patient: [name]  Procedure(s) Performed: IRRIGATION AND DEBRIDEMENT RIGHT FOOT (Right Foot)  Patient Location: PACU  Anesthesia Type:MAC  Level of Consciousness: awake, alert  and oriented  Airway & Oxygen Therapy: Patient Spontanous Breathing  Post-op Assessment: Report given to RN, Post -op Vital signs reviewed and stable and Patient moving all extremities X 4  Post vital signs: Reviewed and stable  Last Vitals:  Vitals:   10/31/17 0157 10/31/17 0534  BP: (!) 125/100 128/85  Pulse: 87 91  Resp: 18 18  Temp: 36.9 C 36.4 C  SpO2: 99% 100%    Last Pain:  Vitals:   10/31/17 0534  TempSrc: Oral  PainSc:       Patients Stated Pain Goal: 2 (74/25/95 6387)  Complications: No apparent anesthesia complications

## 2017-10-31 NOTE — Progress Notes (Signed)
Pt left unit to OR for I+D. Report given to anesthesia

## 2017-10-31 NOTE — Progress Notes (Signed)
PROGRESS NOTE    [name]  MKL:[id number] DOB: 06/06/1981 DOA: 10/25/2017 PCP: Patient, No Pcp Per     Brief Narrative:  [name] is a 37 y.o. male with medical history significant of DM type I, ESRD on HD, and diabetic foot ulcer followed by Dr. March Rummage; who presents with complaints of right foot swelling.  During hemodialysis, it was noticed that he had swelling and red discoloration of his right foot.  He states that he has no feeling in his feet, and did not noticed the symptoms until they pointed out.  He does not recall any recent trauma or injury to his foot.  Patient has a known ulceration of the lateral aspect of the right foot that he states has been there for years.  He had just recently reestablished care and have been being followed by Dr. March Rummage of podiatry.  He was made a urgent appointment at his podiatrist's office and was seen by Dr. Daylene Katayama.  X-rays were obtained which showed no signs of osseous involvement, but he was sent to the emergency department for need of IV antibiotics.    Assessment & Plan:   Principal Problem:   Osteomyelitis of ankle or foot, acute, right ([name]) Active Problems:   Diabetes mellitus type 1 ([name])   ESRD (end stage renal disease) on dialysis ([place])   Essential (primary) hypertension   Sepsis ([name])   Cellulitis of right foot   Abscess or cellulitis of toe, right   Cellulitis of right lower extremity   Right foot infection   Diabetic foot infection ([name])   Sepsis 2/2 osteomyelitis ofright foot,diabetic ulcer -On presentation, tachycardic with fever up to 104.1 F, WBC 25.2, and lactic acid 2.03 -Continue empiric antibiotics of vancomycin and Zosyn -ABI unable to obtain due to non-compressible arteries likely due to calcification  -Consulted Dr. March Rummage, underwent bedside I&D right foot abscess  -MRI revealed osteomyelitis  -Planned for OR this morning for irrigation and debridement   ESRD on HD -Nephrology consulted  Concern  for STD -HIV NR  -GC/Chlamydia negative  -Discussed symptoms to look out for. He is symptom free at this time but worried about exposure   Diabetes mellitus type1 -Ha1c 6.2  -Continue Lantus/NovoLog and sliding scale insulin  Essential hypertension -Continue amlodipine, losartan  Anxiety -Continue Klonopintwice a day prn  Intractable nausea -Improved continue reglan, PPI   Diarrhea -After receiving kayexalate. GI PCR negative. No further diarrhea   DVT prophylaxis: subq hep Code Status: Full Family Communication: No family at bedside Disposition Plan: Pending surgery 2/3    Consultants:   Podiatry  Procedures:   Bedside I&D right foot abscess 1/30 Dr. March Rummage   Antimicrobials:  Anti-infectives (From admission, onward)   Start     Dose/Rate Route Frequency Ordered Stop   11/01/17 1200  vancomycin (VANCOCIN) IVPB 750 mg/150 ml premix     750 mg 150 mL/hr over 60 Minutes Intravenous Every M-W-F (Hemodialysis) 10/30/17 0726     10/30/17 0800  piperacillin-tazobactam (ZOSYN) IVPB 3.375 g     3.375 g 12.5 mL/hr over 240 Minutes Intravenous Every 12 hours 10/30/17 0726     10/29/17 1200  vancomycin (VANCOCIN) IVPB 750 mg/150 ml premix  Status:  Discontinued     750 mg 150 mL/hr over 60 Minutes Intravenous Every M-W-F (Hemodialysis) 10/27/17 1158 10/30/17 0726   10/29/17 1116  Vancomycin (VANCOCIN) 750-5 MG/150ML-% IVPB  Status:  Discontinued    Comments:  Cherylann Banas   : cabinet override  10/29/17 1116 10/29/17 1119   10/29/17 0000  ciprofloxacin (CIPRO) 500 MG tablet     500 mg Oral 2 times daily 10/29/17 1159 11/12/17 2359   10/29/17 0000  clindamycin (CLEOCIN) 300 MG capsule     300 mg Oral 4 times daily 10/29/17 1159 11/12/17 2359   10/27/17 1200  vancomycin (VANCOCIN) IVPB 1000 mg/200 mL premix  Status:  Discontinued     1,000 mg 200 mL/hr over 60 Minutes Intravenous Every M-W-F (Hemodialysis) 10/25/17 2120 10/27/17 1158   10/26/17 0800   piperacillin-tazobactam (ZOSYN) IVPB 3.375 g  Status:  Discontinued     3.375 g 12.5 mL/hr over 240 Minutes Intravenous Every 12 hours 10/25/17 2117 10/30/17 0726   10/25/17 2015  piperacillin-tazobactam (ZOSYN) IVPB 3.375 g     3.375 g 100 mL/hr over 30 Minutes Intravenous  Once 10/25/17 2012 10/25/17 2100   10/25/17 2015  vancomycin (VANCOCIN) IVPB 1000 mg/200 mL premix     1,000 mg 200 mL/hr over 60 Minutes Intravenous  Once 10/25/17 2012 10/25/17 2126       Subjective: Upset that he has to spend the Super Bowl in the hospital. No new physical complaints.    Objective: Vitals:   10/30/17 1827 10/30/17 2151 10/31/17 0157 10/31/17 0534  BP: (!) 131/117 (!) 166/87 (!) 125/100 128/85  Pulse: 86 87 87 91  Resp: 18 18 18 18   Temp: 98.2 F (36.8 C) 98.3 F (36.8 C) 98.4 F (36.9 C) 97.6 F (36.4 C)  TempSrc: Oral Oral Oral Oral  SpO2: 100% 100% 99% 100%  Weight:      Height:       No intake or output data in the 24 hours ending 10/31/17 0927 Filed Weights   10/27/17 1213 10/29/17 0930 10/29/17 1400  Weight: 71 kg (156 lb 8.4 oz) 76.1 kg (167 lb 12.3 oz) 70.4 kg (155 lb 3.3 oz)    Examination:  General exam: Appears calm and comfortable  Respiratory system: Clear to auscultation. Respiratory effort normal. Cardiovascular system: S1 & S2 heard, RRR. No JVD, murmurs, rubs, gallops or clicks. No pedal edema. Gastrointestinal system: Abdomen is nondistended, soft and nontender. No organomegaly or masses felt. Normal bowel sounds heard. Central nervous system: Alert and oriented. No focal neurological deficits. Extremities: Symmetric 5 x 5 power. Skin: right foot dressed after I&D, dried brown fluid plantar surface of dressing  Psychiatry: Judgement and insight appear poor   Data Reviewed: I have personally reviewed following labs and imaging studies  CBC: Recent Labs  Lab 10/25/17 1800  10/27/17 0255 10/28/17 0759 10/29/17 0543 10/30/17 0550 10/31/17 0235  WBC 24.7*    < > 24.8* 21.7* 15.2* 13.9* 15.1*  NEUTROABS 20.0*  --   --   --   --   --   --   HGB 10.3*   < > 10.1* 9.9* 9.2* 10.0* 9.3*  HCT 31.3*   < > 30.6* 30.4* 28.6* 30.8* 28.5*  MCV 94.3   < > 93.6 93.5 93.2 95.1 95.3  PLT 186   < > 213 245 261 278 302   < > = values in this interval not displayed.   Basic Metabolic Panel: Recent Labs  Lab 10/27/17 0255 10/28/17 0221 10/29/17 0543 10/30/17 0550 10/31/17 0235  NA 128* 129* 132* 132* 130*  130*  K 4.8 4.5 4.2 4.3 4.8  4.8  CL 91* 93* 96* 92* 93*  92*  CO2 17* 20* 23 27 23  24   GLUCOSE 352* 416* 172* 314*  262*  261*  BUN 36* 22* 36* 23* 39*  40*  CREATININE 8.74* 5.94* 8.09* 6.11* 7.95*  7.93*  CALCIUM 9.3 9.0 8.8* 8.7* 8.9  8.9  MG 2.3  --   --   --   --   PHOS 5.3* 3.0  --   --  4.1   GFR: Estimated Creatinine Clearance: 11.4 mL/min (A) (by C-G formula based on SCr of 7.95 mg/dL (H)). Liver Function Tests: Recent Labs  Lab 10/25/17 1800 10/28/17 0221 10/31/17 0235  AST 18  --   --   ALT 10*  --   --   ALKPHOS 114  --   --   BILITOT 2.0*  --   --   PROT 7.1  --   --   ALBUMIN 3.4* 3.0* 2.7*   No results for input(s): LIPASE, AMYLASE in the last 168 hours. No results for input(s): AMMONIA in the last 168 hours. Coagulation Profile: Recent Labs  Lab 10/25/17 1800  INR 1.16   Cardiac Enzymes: No results for input(s): CKTOTAL, CKMB, CKMBINDEX, TROPONINI in the last 168 hours. BNP (last 3 results) No results for input(s): PROBNP in the last 8760 hours. HbA1C: No results for input(s): HGBA1C in the last 72 hours. CBG: Recent Labs  Lab 10/30/17 1318 10/30/17 1707 10/30/17 1819 10/30/17 2148 10/31/17 0532  GLUCAP 167* 101* 129* 181* 165*   Lipid Profile: No results for input(s): CHOL, HDL, LDLCALC, TRIG, CHOLHDL, LDLDIRECT in the last 72 hours. Thyroid Function Tests: No results for input(s): TSH, T4TOTAL, FREET4, T3FREE, THYROIDAB in the last 72 hours. Anemia Panel: No results for input(s):  VITAMINB12, FOLATE, FERRITIN, TIBC, IRON, RETICCTPCT in the last 72 hours. Sepsis Labs: Recent Labs  Lab 10/25/17 1814 10/25/17 1958  LATICACIDVEN 2.03* 1.27    Recent Results (from the past 240 hour(s))  Culture, blood (Routine x 2)     Status: None   Collection Time: 10/25/17  6:00 PM  Result Value Ref Range Status   Specimen Description BLOOD BLOOD LEFT FOREARM  Final   Special Requests   Final    BOTTLES DRAWN AEROBIC AND ANAEROBIC Blood Culture adequate volume   Culture   Final    NO GROWTH 5 DAYS Performed at [facility], [address] [place], [place] [address]    Report Status 10/30/2017 FINAL  Final  Culture, blood (Routine x 2)     Status: None   Collection Time: 10/25/17  7:45 PM  Result Value Ref Range Status   Specimen Description BLOOD LEFT HAND  Final   Special Requests IN PEDIATRIC BOTTLE Blood Culture adequate volume  Final   Culture   Final    NO GROWTH 5 DAYS Performed at [facility], [place] [address], [place], [place] [address]    Report Status 10/30/2017 FINAL  Final  Gastrointestinal Panel by PCR , Stool     Status: None   Collection Time: 10/26/17  1:00 PM  Result Value Ref Range Status   Campylobacter species NOT DETECTED NOT DETECTED Final   Plesimonas shigelloides NOT DETECTED NOT DETECTED Final   Salmonella species NOT DETECTED NOT DETECTED Final   Yersinia enterocolitica NOT DETECTED NOT DETECTED Final   Vibrio species NOT DETECTED NOT DETECTED Final   Vibrio cholerae NOT DETECTED NOT DETECTED Final   Enteroaggregative E coli (EAEC) NOT DETECTED NOT DETECTED Final   Enteropathogenic E coli (EPEC) NOT DETECTED NOT DETECTED Final   Enterotoxigenic E coli (ETEC) NOT DETECTED NOT DETECTED  Final   Shiga like toxin producing E coli (STEC) NOT DETECTED NOT DETECTED Final   Shigella/Enteroinvasive E coli (EIEC) NOT DETECTED NOT DETECTED Final   Cryptosporidium NOT DETECTED NOT DETECTED Final   Cyclospora cayetanensis NOT DETECTED NOT  DETECTED Final   Entamoeba histolytica NOT DETECTED NOT DETECTED Final   Giardia lamblia NOT DETECTED NOT DETECTED Final   Adenovirus F40/41 NOT DETECTED NOT DETECTED Final   Astrovirus NOT DETECTED NOT DETECTED Final   Norovirus GI/GII NOT DETECTED NOT DETECTED Final   Rotavirus A NOT DETECTED NOT DETECTED Final   Sapovirus (I, II, IV, and V) NOT DETECTED NOT DETECTED Final    Comment: Performed at [facility], [address] [place], [place] [address]  MRSA PCR Screening     Status: Abnormal   Collection Time: 10/26/17  5:22 PM  Result Value Ref Range Status   MRSA by PCR INVALID RESULTS, SPECIMEN SENT FOR CULTURE (A) NEGATIVE Final    Comment:        The GeneXpert MRSA Assay (FDA approved for NASAL specimens only), is one component of a comprehensive MRSA colonization surveillance program. It is not intended to diagnose MRSA infection nor to guide or monitor treatment for MRSA infections. RESULT CALLED TO, READ BACK BY AND VERIFIED WITH: RN Nona Dell [id number]   MRSA culture     Status: Abnormal   Collection Time: 10/26/17  8:00 PM  Result Value Ref Range Status   Specimen Description NASAL SWAB  Final   Special Requests   Final    NONE Performed at [facility], [address], [place], [place] [address]    Culture STAPHYLOCOCCUS AUREUS (A)  Final   Report Status 10/30/2017 FINAL  Final   Organism ID, Bacteria STAPHYLOCOCCUS AUREUS  Final      Susceptibility   Staphylococcus aureus - MIC*    CIPROFLOXACIN >=8 RESISTANT Resistant     ERYTHROMYCIN <=0.25 SENSITIVE Sensitive     GENTAMICIN <=0.5 SENSITIVE Sensitive     OXACILLIN 0.5 SENSITIVE Sensitive     TETRACYCLINE <=1 SENSITIVE Sensitive     VANCOMYCIN 1 SENSITIVE Sensitive     TRIMETH/SULFA <=10 SENSITIVE Sensitive     CLINDAMYCIN <=0.25 SENSITIVE Sensitive     RIFAMPIN <=0.5 SENSITIVE Sensitive     Inducible Clindamycin NEGATIVE Sensitive     * STAPHYLOCOCCUS AUREUS  MRSA PCR Screening      Status: None   Collection Time: 10/28/17 10:25 AM  Result Value Ref Range Status   MRSA by PCR NEGATIVE NEGATIVE Final    Comment:        The GeneXpert MRSA Assay (FDA approved for NASAL specimens only), is one component of a comprehensive MRSA colonization surveillance program. It is not intended to diagnose MRSA infection nor to guide or monitor treatment for MRSA infections.        Radiology Studies: No results found.    Scheduled Meds: . amLODipine  10 mg Oral QHS  . cinacalcet  30 mg Oral Q supper  . [START ON 11/05/2017] darbepoetin (ARANESP) injection - DIALYSIS  60 mcg Intravenous Q Fri-HD  . heparin  5,000 Units Subcutaneous Q8H  . insulin aspart  0-5 Units Subcutaneous QHS  . insulin aspart  0-9 Units Subcutaneous TID WC  . insulin aspart  8 Units Subcutaneous TID AC  . insulin glargine  25 Units Subcutaneous QHS  . losartan  50 mg Oral QHS  . metoCLOPramide (REGLAN) injection  5 mg Intravenous Q8H  .  multivitamin  1 tablet Oral QHS  . pantoprazole  40 mg Oral Daily  . sevelamer carbonate  3,200 mg Oral TID WC   Continuous Infusions: . piperacillin-tazobactam (ZOSYN)  IV Stopped (10/31/17 0008)  . [START ON 11/01/2017] vancomycin       LOS: 6 days    Time spent: 30 minutes   Dessa Phi, DO [facility] [email or web address] Password [facility] 10/31/2017, 9:27 AM

## 2017-10-31 NOTE — Op Note (Signed)
Patient Name: [name] DOB: 01-13-1981  MRN: [id number]  Date of Service: 10/31/17   Surgeon: Dr. Hardie Pulley, DPM Assistants: None Pre-operative Diagnosis: R foot infection Post-operative Diagnosis: same Procedures:             1)  Incision and drainage of R 1st interspace  2) Excisional debridement of Lateral foot wound. Pathology/Specimens:             1)  Deep soft tissue - micro  2) Deep swab - micro Anesthesia: MAC/Local Hemostasis: Anatomic Estimated Blood Loss: 20 ml Materials: None Medications: 1g Vancomycin powder applied topically Complications: None  Indications for Procedure: This is a 37 year old male with past medical history of type 1 diabetes, end-stage renal disease on dialysis who presented with concerns of right foot infection.  He is followed outpatient for a lateral foot wound sub-fifth metatarsal bilaterally. He presented with signs of sepsis with fevers and leukocytosis.  He was given antibiotics white count remained elevated.  He underwent bedside incision and drainage of the first interspace with purulent return.  After that his white count began decreasing. He still had slightly elevated white count is still slightly elevated white count with continued erythema of the foot most concentrated medially about the first and second toes, and small amount of continued purulence noted about the first interspace.  For this reason it was discussed with the patient that he would benefit from incision and drainage of the first interspace with debridement of nonviable tissue and debridement of his lateral foot wound.  All risk benefits and alternatives explained to patient.  Patient agreed to proceed.  procedure in Detail: Patient was identified in pre-operative holding area. Formal consent was signed and the right lower extremity was marked. Patient was brought back to the operating room and placed on the operating room table in the supine position. Anesthesia was  induced. The right lower extremity was prepped and draped in the usual sterile fashion. Timeout was taken to confirm patient name, laterality, and procedure prior to incision. Attention was then directed to the right foot where a linear incision was made overlying the first interspace.  There was fibrotic and slightly necrotic tissue at the first interspace which was thoroughly excisionally debrided sharply and with a Masonic ultrasonic debrider.  The wound was probed with a Valora Corporal and though the soft tissues easily gave way to a Valora Corporal and did not endorse resistance there was no continued purulence or abscess formation noted.  Deep soft tissue culture and swab culture were taken.  The wound was packed with iodoform packing impregnated with vancomycin powder.  Attention was then directed to the lateral foot wound where the wound was sharply debrided with a 10 blade.  The wound was additionally debrided with Masonic ultrasonic debrider.  The wound did not probe deep, the wound base was healthy and there was no exposed bone.  The wound measured approximately 2 cm x 2 cm post-debridement. The foot was then dressed with Betadine soaked 4x4, dry 4 x 4's, Kerlix, and ACE bandage. Patient tolerated the procedure well.  Disposition: Following a period of post-operative monitoring, patient will be transferred back to the floor.  His dressing will be left intact until tomorrow where after daily packing changes will be performed by nursing.  We will follow cultures for updated antibiotic recommendations.

## 2017-10-31 NOTE — Anesthesia Preprocedure Evaluation (Addendum)
Anesthesia Evaluation  Patient identified by MRN, date of birth, ID band Patient awake    Reviewed: Allergy & Precautions, NPO status , Patient's Chart, lab work & pertinent test results  History of Anesthesia Complications Negative for: history of anesthetic complications  Airway Mallampati: II  TM Distance: >3 FB Neck ROM: Full    Dental  (+) Teeth Intact, Dental Advisory Given   Pulmonary neg pulmonary ROS,    breath sounds clear to auscultation       Cardiovascular hypertension, Pt. on medications (-) angina Rhythm:Regular Rate:Normal     Neuro/Psych negative neurological ROS     GI/Hepatic negative GI ROS, Neg liver ROS,   Endo/Other  diabetes (glu 132), Insulin Dependent  Renal/GU Dialysis and ESRFRenal disease (K+ 4.8)     Musculoskeletal   Abdominal   Peds  Hematology  (+) Blood dyscrasia (Hb 9.3), anemia ,   Anesthesia Other Findings   Reproductive/Obstetrics                           Anesthesia Physical Anesthesia Plan  ASA: III  Anesthesia Plan: MAC   Post-op Pain Management:    Induction:   PONV Risk Score and Plan: 1 and Ondansetron  Airway Management Planned: Natural Airway and Nasal Cannula  Additional Equipment:   Intra-op Plan:   Post-operative Plan:   Informed Consent: I have reviewed the patients History and Physical, chart, labs and discussed the procedure including the risks, benefits and alternatives for the proposed anesthesia with the patient or authorized representative who has indicated his/her understanding and acceptance.   Dental advisory given  Plan Discussed with: CRNA and Surgeon  Anesthesia Plan Comments: (Plan routine monitors, MAC)        Anesthesia Quick Evaluation

## 2017-10-31 NOTE — Progress Notes (Addendum)
RN attempted educating patient about affect of diet on health. Patient showed no evidence of learning. RN attempted explaining and teach back, patient stated he doesn't want to know.   RN educated patient on carb modified diet. Patient refused teaching, continues to buy food that doesn't comply with diet. MD notified

## 2017-10-31 NOTE — Anesthesia Procedure Notes (Signed)
Procedure Name: MAC Date/Time: 10/31/2017 11:05 AM Performed by: Harden Mo, CRNA Pre-anesthesia Checklist: Patient identified, Emergency Drugs available, Suction available and Patient being monitored Patient Re-evaluated:Patient Re-evaluated prior to induction Oxygen Delivery Method: Simple face mask Preoxygenation: Pre-oxygenation with 100% oxygen Induction Type: IV induction Placement Confirmation: positive ETCO2 and breath sounds checked- equal and bilateral Dental Injury: Teeth and Oropharynx as per pre-operative assessment

## 2017-10-31 NOTE — Progress Notes (Signed)
Pharmacy Antibiotic Note  [name] is a 37 y.o. male admitted on 10/25/2017 with cellulitis.  Pharmacy has been consulted for vancomycin and Zosyn dosing. Pt is ESRD on dialysis (MWF).  I+D today  Afebrile WBC = 15.1  Blood cultures negative and final   Plan: Continue Vancomycin 750 mg iv Q MWF Zosyn 3.375 grams iv Q 12 hours Continue to follow   Height: 5\' 6"  (167.6 cm) Weight: 155 lb 3.3 oz (70.4 kg) IBW/kg (Calculated) : 63.8  Temp (24hrs), Avg:98.1 F (36.7 C), Min:97.6 F (36.4 C), Max:98.4 F (36.9 C)  Recent Labs  Lab 10/25/17 1814 10/25/17 1958  10/27/17 0255 10/28/17 0221 10/28/17 0759 10/29/17 0543 10/30/17 0550 10/31/17 0235  WBC  --   --    < > 24.8*  --  21.7* 15.2* 13.9* 15.1*  CREATININE  --   --    < > 8.74* 5.94*  --  8.09* 6.11* 7.95*  7.93*  LATICACIDVEN 2.03* 1.27  --   --   --   --   --   --   --    < > = values in this interval not displayed.    Estimated Creatinine Clearance: 11.4 mL/min (A) (by C-G formula based on SCr of 7.95 mg/dL (H)).    Allergies  Allergen Reactions  . No Known Allergies     Antimicrobials this admission: Vanc 1/28 >> Zosyn 1/28 >>  Microbiology results: 1/28 BCx >> ngtd 1/29 MRSA PCR >> invalid  Thank you for allowing pharmacy to be a part of this patient's care. Anette Guarneri, PharmD [phone] 10/31/2017 11:00 AM

## 2017-10-31 NOTE — Interval H&P Note (Signed)
History and Physical Interval Note:  10/31/2017 11:00 AM  [name]  has presented today for surgery, with the diagnosis of Right Foot infection  The various methods of treatment have been discussed with the patient and family. After consideration of risks, benefits and other options for treatment, the patient has consented to  Procedure(s): IRRIGATION AND DEBRIDEMENT RIGHT FOOT (Right) as a surgical intervention .  The patient's history has been reviewed, patient examined, no change in status, stable for surgery.  I have reviewed the patient's chart and labs.  Questions were answered to the patient's satisfaction.     Evelina Bucy

## 2017-10-31 NOTE — Anesthesia Postprocedure Evaluation (Signed)
Anesthesia Post Note  Patient: [name]  Procedure(s) Performed: IRRIGATION AND DEBRIDEMENT RIGHT FOOT (Right Foot)     Patient location during evaluation: PACU Anesthesia Type: MAC Level of consciousness: awake and alert, patient cooperative and oriented Pain management: pain level controlled Vital Signs Assessment: post-procedure vital signs reviewed and stable Respiratory status: spontaneous breathing, nonlabored ventilation and respiratory function stable Cardiovascular status: blood pressure returned to baseline and stable Postop Assessment: no apparent nausea or vomiting Anesthetic complications: no    Last Vitals:  Vitals:   10/31/17 1210 10/31/17 1223  BP: (!) 146/91 (!) 164/97  Pulse: 84 86  Resp: 20 20  Temp:  (!) 36.3 C  SpO2: 100% 100%    Last Pain:  Vitals:   10/31/17 1223  TempSrc:   PainSc: 0-No pain                 Ciela Mahajan,E. Tra Wilemon

## 2017-11-01 ENCOUNTER — Encounter (HOSPITAL_COMMUNITY): Payer: Self-pay | Admitting: Podiatry

## 2017-11-01 LAB — RENAL FUNCTION PANEL
Albumin: 2.7 g/dL — ABNORMAL LOW (ref 3.5–5.0)
Anion gap: 14 (ref 5–15)
BUN: 55 mg/dL — ABNORMAL HIGH (ref 6–20)
CO2: 25 mmol/L (ref 22–32)
Calcium: 8.8 mg/dL — ABNORMAL LOW (ref 8.9–10.3)
Chloride: 96 mmol/L — ABNORMAL LOW (ref 101–111)
Creatinine, Ser: 9.55 mg/dL — ABNORMAL HIGH (ref 0.61–1.24)
GFR calc Af Amer: 7 mL/min — ABNORMAL LOW (ref >60)
GFR calc non Af Amer: 6 mL/min — ABNORMAL LOW (ref >60)
Glucose, Bld: 125 mg/dL — ABNORMAL HIGH (ref 65–99)
Phosphorus: 5.9 mg/dL — ABNORMAL HIGH (ref 2.5–4.6)
Potassium: 5.2 mmol/L — ABNORMAL HIGH (ref 3.5–5.1)
Sodium: 135 mmol/L (ref 135–145)

## 2017-11-01 LAB — GLUCOSE, CAPILLARY
Glucose-Capillary: 127 mg/dL — ABNORMAL HIGH (ref 65–99)
Glucose-Capillary: 116 mg/dL — ABNORMAL HIGH (ref 65–99)
Glucose-Capillary: 82 mg/dL (ref 65–99)
Glucose-Capillary: 216 mg/dL — ABNORMAL HIGH (ref 65–99)
Glucose-Capillary: 206 mg/dL — ABNORMAL HIGH (ref 65–99)

## 2017-11-01 LAB — CBC
HCT: 28.7 % — ABNORMAL LOW (ref 39.0–52.0)
Hemoglobin: 9.1 g/dL — ABNORMAL LOW (ref 13.0–17.0)
MCH: 30.6 pg (ref 26.0–34.0)
MCHC: 31.7 g/dL (ref 30.0–36.0)
MCV: 96.6 fL (ref 78.0–100.0)
Platelets: 360 10*3/uL (ref 150–400)
RBC: 2.97 MIL/uL — ABNORMAL LOW (ref 4.22–5.81)
RDW: 14.6 % (ref 11.5–15.5)
WBC: 13.7 10*3/uL — ABNORMAL HIGH (ref 4.0–10.5)

## 2017-11-01 MED ORDER — HEPARIN SODIUM (PORCINE) 1000 UNIT/ML DIALYSIS
1000.0000 [IU] | INTRAMUSCULAR | Status: DC | PRN
  Filled 2017-11-01: qty 1

## 2017-11-01 MED ORDER — VANCOMYCIN HCL IN DEXTROSE 750-5 MG/150ML-% IV SOLN
INTRAVENOUS | Status: AC
  Filled 2017-11-01: qty 150

## 2017-11-01 MED ORDER — LIDOCAINE HCL (PF) 1 % IJ SOLN: 5.0000 mL | INTRAMUSCULAR | Status: DC | PRN

## 2017-11-01 MED ORDER — PENTAFLUOROPROP-TETRAFLUOROETH EX AERO: 1.0000 "application " | INHALATION_SPRAY | CUTANEOUS | Status: DC | PRN

## 2017-11-01 MED ORDER — SODIUM CHLORIDE 0.9 % IV SOLN: 100.0000 mL | INTRAVENOUS | Status: DC | PRN

## 2017-11-01 MED ORDER — ALTEPLASE 2 MG IJ SOLR
2.0000 mg | Freq: Once | INTRAMUSCULAR | Status: DC | PRN
  Filled 2017-11-01: qty 2

## 2017-11-01 MED ORDER — LIDOCAINE-PRILOCAINE 2.5-2.5 % EX CREA: 1.0000 "application " | TOPICAL_CREAM | CUTANEOUS | Status: DC | PRN

## 2017-11-01 MED ORDER — HEPARIN SODIUM (PORCINE) 1000 UNIT/ML DIALYSIS
9000.0000 [IU] | INTRAMUSCULAR | Status: DC | PRN
  Filled 2017-11-01: qty 9

## 2017-11-01 NOTE — Progress Notes (Signed)
Plan is for pt to d/c home with [facility] RN for dressing changes and education. CM provided the patient choice and he selected [facility]. Drew with [facility] notified and accepted the referral. Pt has no PCP listed. Pt states he sees Dr Jimmy Footman for his health care needs.  Pt inquired about a dentist that would accepted Medicaid. CM called several dentists in his area without success. CM provided him the resources for free clinics and dental schools so he can f/u in finding a dentist. Pt has transportation home when medically ready.

## 2017-11-01 NOTE — Progress Notes (Signed)
PROGRESS NOTE    [name]  LZJ:[id number] DOB: 1981-04-30 DOA: 10/25/2017 PCP: Patient, No Pcp Per     Brief Narrative:  [name] is a 37 y.o. male with medical history significant of DM type I, ESRD on HD, and diabetic foot ulcer followed by Dr. March Rummage; who presents with complaints of right foot swelling.  During hemodialysis, it was noticed that he had swelling and red discoloration of his right foot.  He states that he has no feeling in his feet, and did not noticed the symptoms until they pointed out.  He does not recall any recent trauma or injury to his foot.  Patient has a known ulceration of the lateral aspect of the right foot that he states has been there for years.  He had just recently reestablished care and have been being followed by Dr. March Rummage of podiatry.  He was made a urgent appointment at his podiatrist's office and was seen by Dr. Daylene Katayama.  X-rays were obtained which showed no signs of osseous involvement, but he was sent to the emergency department for need of IV antibiotics.    Assessment & Plan:   Principal Problem:   Osteomyelitis of ankle or foot, acute, right ([name]) Active Problems:   Diabetes mellitus type 1 ([name])   ESRD (end stage renal disease) on dialysis ([place])   Essential (primary) hypertension   Sepsis ([name])   Cellulitis of right foot   Abscess or cellulitis of toe, right   Cellulitis of right lower extremity   Right foot infection   Diabetic foot infection ([name])   Ulcerated, foot, right, limited to breakdown of skin ([name])   Sepsis 2/2 osteomyelitis ofright foot,diabetic ulcer -On presentation, tachycardic with fever up to 104.1 F, WBC 25.2, and lactic acid 2.03 -Continue empiric antibiotics of vancomycin and Zosyn -ABI unable to obtain due to non-compressible arteries likely due to calcification  -Consulted Dr. March Rummage, underwent bedside I&D right foot abscess  -MRI revealed osteomyelitis  -S/p irrigation and debridement by Dr. March Rummage  2/3 -Await culture results, continue to trend WBC  -Home health RN for every other day packing changes, aquacell packing to the 1st interspace. CM consulted   ESRD on HD -Nephrology consulted  Concern for STD -HIV NR  -GC/Chlamydia negative  -Discussed symptoms to look out for. He is symptom free at this time but worried about exposure   Diabetes mellitus type1 -Ha1c 6.2  -Continue Lantus/NovoLog and sliding scale insulin  Essential hypertension -Continue amlodipine, losartan  Anxiety -Continue Klonopintwice a day prn  Intractable nausea -Improved continue reglan, PPI   Diarrhea -After receiving kayexalate. GI PCR negative. No further diarrhea   DVT prophylaxis: subq hep Code Status: Full Family Communication: No family at bedside Disposition Plan: Pending surgical wound culture results, resolution of leukocytosis    Consultants:   Podiatry  Procedures:   Bedside I&D right foot abscess 1/30 Dr. March Rummage   Incision and drainage R 1st interspace, excisional debridement lateral foot wound 2/3 Dr. March Rummage   Antimicrobials:  Anti-infectives (From admission, onward)   Start     Dose/Rate Route Frequency Ordered Stop   11/01/17 1200  vancomycin (VANCOCIN) IVPB 750 mg/150 ml premix     750 mg 150 mL/hr over 60 Minutes Intravenous Every M-W-F (Hemodialysis) 10/30/17 0726     10/31/17 1219  vancomycin (VANCOCIN) powder  Status:  Discontinued       As needed 10/31/17 1220 10/31/17 1221   10/30/17 0800  piperacillin-tazobactam (ZOSYN) IVPB 3.375 g  3.375 g 12.5 mL/hr over 240 Minutes Intravenous Every 12 hours 10/30/17 0726     10/29/17 1200  vancomycin (VANCOCIN) IVPB 750 mg/150 ml premix  Status:  Discontinued     750 mg 150 mL/hr over 60 Minutes Intravenous Every M-W-F (Hemodialysis) 10/27/17 1158 10/30/17 0726   10/29/17 1116  Vancomycin (VANCOCIN) 750-5 MG/150ML-% IVPB  Status:  Discontinued    Comments:  Cherylann Banas   : cabinet override      10/29/17 1116  10/29/17 1119   10/29/17 0000  ciprofloxacin (CIPRO) 500 MG tablet     500 mg Oral 2 times daily 10/29/17 1159 11/12/17 2359   10/29/17 0000  clindamycin (CLEOCIN) 300 MG capsule     300 mg Oral 4 times daily 10/29/17 1159 11/12/17 2359   10/27/17 1200  vancomycin (VANCOCIN) IVPB 1000 mg/200 mL premix  Status:  Discontinued     1,000 mg 200 mL/hr over 60 Minutes Intravenous Every M-W-F (Hemodialysis) 10/25/17 2120 10/27/17 1158   10/26/17 0800  piperacillin-tazobactam (ZOSYN) IVPB 3.375 g  Status:  Discontinued     3.375 g 12.5 mL/hr over 240 Minutes Intravenous Every 12 hours 10/25/17 2117 10/30/17 0726   10/25/17 2015  piperacillin-tazobactam (ZOSYN) IVPB 3.375 g     3.375 g 100 mL/hr over 30 Minutes Intravenous  Once 10/25/17 2012 10/25/17 2100   10/25/17 2015  vancomycin (VANCOCIN) IVPB 1000 mg/200 mL premix     1,000 mg 200 mL/hr over 60 Minutes Intravenous  Once 10/25/17 2012 10/25/17 2126       Subjective: Very upset that he was not first shift for HD. States he is leaving today after HD no matter what. We discussed it is not advisable to leave without appropriate treatment, as we are still awaiting culture results and he remains on IV antibiotics. He wants to get his pain medication, get home health set up, will call Dr. Eleanora Neighbor office for follow up and states that he does not have or need PCP.   Objective: Vitals:   10/31/17 2153 11/01/17 0134 11/01/17 0433 11/01/17 1000  BP: 137/77 139/80 137/82 (!) 173/97  Pulse: 100 95 96 99  Resp: 20 20 18 18   Temp: 98.4 F (36.9 C) 97.6 F (36.4 C) 98.5 F (36.9 C) 98.9 F (37.2 C)  TempSrc: Oral Oral Oral Oral  SpO2: 100% 100% 100% 100%  Weight:      Height:        Intake/Output Summary (Last 24 hours) at 11/01/2017 1233 Last data filed at 10/31/2017 2032 Gross per 24 hour  Intake 50 ml  Output -  Net 50 ml   Filed Weights   10/27/17 1213 10/29/17 0930 10/29/17 1400  Weight: 71 kg (156 lb 8.4 oz) 76.1 kg (167 lb 12.3 oz)  70.4 kg (155 lb 3.3 oz)    Examination:  General exam: Appears calm and comfortable  Respiratory system: Clear to auscultation. Respiratory effort normal. Cardiovascular system: S1 & S2 heard, RRR. No JVD, murmurs, rubs, gallops or clicks. No pedal edema. Gastrointestinal system: Abdomen is nondistended, soft and nontender. No organomegaly or masses felt. Normal bowel sounds heard. Central nervous system: Alert and oriented. No focal neurological deficits. Extremities: Symmetric 5 x 5 power. Skin: right foot dressed after I&D Psychiatry: Judgement and insight appear poor   Data Reviewed: I have personally reviewed following labs and imaging studies  CBC: Recent Labs  Lab 10/25/17 1800  10/28/17 0759 10/29/17 0543 10/30/17 0550 10/31/17 0235 11/01/17 0435  WBC 24.7*   < >  21.7* 15.2* 13.9* 15.1* 13.7*  NEUTROABS 20.0*  --   --   --   --   --   --   HGB 10.3*   < > 9.9* 9.2* 10.0* 9.3* 9.1*  HCT 31.3*   < > 30.4* 28.6* 30.8* 28.5* 28.7*  MCV 94.3   < > 93.5 93.2 95.1 95.3 96.6  PLT 186   < > 245 261 278 302 360   < > = values in this interval not displayed.   Basic Metabolic Panel: Recent Labs  Lab 10/27/17 0255 10/28/17 0221 10/29/17 0543 10/30/17 0550 10/31/17 0235 11/01/17 0435  NA 128* 129* 132* 132* 130*  130* 135  K 4.8 4.5 4.2 4.3 4.8  4.8 5.2*  CL 91* 93* 96* 92* 93*  92* 96*  CO2 17* 20* 23 27 23  24 25   GLUCOSE 352* 416* 172* 314* 262*  261* 125*  BUN 36* 22* 36* 23* 39*  40* 55*  CREATININE 8.74* 5.94* 8.09* 6.11* 7.95*  7.93* 9.55*  CALCIUM 9.3 9.0 8.8* 8.7* 8.9  8.9 8.8*  MG 2.3  --   --   --   --   --   PHOS 5.3* 3.0  --   --  4.1 5.9*   GFR: Estimated Creatinine Clearance: 9.5 mL/min (A) (by C-G formula based on SCr of 9.55 mg/dL (H)). Liver Function Tests: Recent Labs  Lab 10/25/17 1800 10/28/17 0221 10/31/17 0235 11/01/17 0435  AST 18  --   --   --   ALT 10*  --   --   --   ALKPHOS 114  --   --   --   BILITOT 2.0*  --   --   --     PROT 7.1  --   --   --   ALBUMIN 3.4* 3.0* 2.7* 2.7*   No results for input(s): LIPASE, AMYLASE in the last 168 hours. No results for input(s): AMMONIA in the last 168 hours. Coagulation Profile: Recent Labs  Lab 10/25/17 1800  INR 1.16   Cardiac Enzymes: No results for input(s): CKTOTAL, CKMB, CKMBINDEX, TROPONINI in the last 168 hours. BNP (last 3 results) No results for input(s): PROBNP in the last 8760 hours. HbA1C: No results for input(s): HGBA1C in the last 72 hours. CBG: Recent Labs  Lab 10/31/17 1823 10/31/17 2151 11/01/17 0133 11/01/17 0622 11/01/17 1131  GLUCAP 228* 183* 127* 116* 82   Lipid Profile: No results for input(s): CHOL, HDL, LDLCALC, TRIG, CHOLHDL, LDLDIRECT in the last 72 hours. Thyroid Function Tests: No results for input(s): TSH, T4TOTAL, FREET4, T3FREE, THYROIDAB in the last 72 hours. Anemia Panel: No results for input(s): VITAMINB12, FOLATE, FERRITIN, TIBC, IRON, RETICCTPCT in the last 72 hours. Sepsis Labs: Recent Labs  Lab 10/25/17 1814 10/25/17 1958  LATICACIDVEN 2.03* 1.27    Recent Results (from the past 240 hour(s))  Culture, blood (Routine x 2)     Status: None   Collection Time: 10/25/17  6:00 PM  Result Value Ref Range Status   Specimen Description BLOOD BLOOD LEFT FOREARM  Final   Special Requests   Final    BOTTLES DRAWN AEROBIC AND ANAEROBIC Blood Culture adequate volume   Culture   Final    NO GROWTH 5 DAYS Performed at [facility], [address], [place], [place] [address]    Report Status 10/30/2017 FINAL  Final  Culture, blood (Routine x 2)     Status: None   Collection Time: 10/25/17  7:45 PM  Result Value Ref Range Status   Specimen Description BLOOD LEFT HAND  Final   Special Requests IN PEDIATRIC BOTTLE Blood Culture adequate volume  Final   Culture   Final    NO GROWTH 5 DAYS Performed at [facility], [address], [place], [place] [address]    Report Status 10/30/2017 FINAL  Final   Gastrointestinal Panel by PCR , Stool     Status: None   Collection Time: 10/26/17  1:00 PM  Result Value Ref Range Status   Campylobacter species NOT DETECTED NOT DETECTED Final   Plesimonas shigelloides NOT DETECTED NOT DETECTED Final   Salmonella species NOT DETECTED NOT DETECTED Final   Yersinia enterocolitica NOT DETECTED NOT DETECTED Final   Vibrio species NOT DETECTED NOT DETECTED Final   Vibrio cholerae NOT DETECTED NOT DETECTED Final   Enteroaggregative E coli (EAEC) NOT DETECTED NOT DETECTED Final   Enteropathogenic E coli (EPEC) NOT DETECTED NOT DETECTED Final   Enterotoxigenic E coli (ETEC) NOT DETECTED NOT DETECTED Final   Shiga like toxin producing E coli (STEC) NOT DETECTED NOT DETECTED Final   Shigella/Enteroinvasive E coli (EIEC) NOT DETECTED NOT DETECTED Final   Cryptosporidium NOT DETECTED NOT DETECTED Final   Cyclospora cayetanensis NOT DETECTED NOT DETECTED Final   Entamoeba histolytica NOT DETECTED NOT DETECTED Final   Giardia lamblia NOT DETECTED NOT DETECTED Final   Adenovirus F40/41 NOT DETECTED NOT DETECTED Final   Astrovirus NOT DETECTED NOT DETECTED Final   Norovirus GI/GII NOT DETECTED NOT DETECTED Final   Rotavirus A NOT DETECTED NOT DETECTED Final   Sapovirus (I, II, IV, and V) NOT DETECTED NOT DETECTED Final    Comment: Performed at [facility], [address] [place], [place] [address]  MRSA PCR Screening     Status: Abnormal   Collection Time: 10/26/17  5:22 PM  Result Value Ref Range Status   MRSA by PCR INVALID RESULTS, SPECIMEN SENT FOR CULTURE (A) NEGATIVE Final    Comment:        The GeneXpert MRSA Assay (FDA approved for NASAL specimens only), is one component of a comprehensive MRSA colonization surveillance program. It is not intended to diagnose MRSA infection nor to guide or monitor treatment for MRSA infections. RESULT CALLED TO, READ BACK BY AND VERIFIED WITH: RN Nona Dell [phone]   MRSA culture     Status:  Abnormal   Collection Time: 10/26/17  8:00 PM  Result Value Ref Range Status   Specimen Description NASAL SWAB  Final   Special Requests   Final    NONE Performed at [facility], [address] [place], [place] [address]    Culture STAPHYLOCOCCUS AUREUS (A)  Final   Report Status 10/30/2017 FINAL  Final   Organism ID, Bacteria STAPHYLOCOCCUS AUREUS  Final      Susceptibility   Staphylococcus aureus - MIC*    CIPROFLOXACIN >=8 RESISTANT Resistant     ERYTHROMYCIN <=0.25 SENSITIVE Sensitive     GENTAMICIN <=0.5 SENSITIVE Sensitive     OXACILLIN 0.5 SENSITIVE Sensitive     TETRACYCLINE <=1 SENSITIVE Sensitive     VANCOMYCIN 1 SENSITIVE Sensitive     TRIMETH/SULFA <=10 SENSITIVE Sensitive     CLINDAMYCIN <=0.25 SENSITIVE Sensitive     RIFAMPIN <=0.5 SENSITIVE Sensitive     Inducible Clindamycin NEGATIVE Sensitive     * STAPHYLOCOCCUS AUREUS  MRSA PCR Screening     Status: None   Collection Time:  10/28/17 10:25 AM  Result Value Ref Range Status   MRSA by PCR NEGATIVE NEGATIVE Final    Comment:        The GeneXpert MRSA Assay (FDA approved for NASAL specimens only), is one component of a comprehensive MRSA colonization surveillance program. It is not intended to diagnose MRSA infection nor to guide or monitor treatment for MRSA infections.   Aerobic/Anaerobic Culture (surgical/deep wound)     Status: None (Preliminary result)   Collection Time: 10/31/17 11:56 AM  Result Value Ref Range Status   Specimen Description TISSUE RIGHT FOOT ULCERATION  Final   Special Requests PT TAKING ZOSYIN  Final   Gram Stain   Final    FEW WBC PRESENT, PREDOMINANTLY PMN NO ORGANISMS SEEN Performed at [facility], [place] [address], [place], [place] [address]    Culture PENDING  Incomplete   Report Status PENDING  Incomplete  Anaerobic culture     Status: None (Preliminary result)   Collection Time: 10/31/17 11:59 AM  Result Value Ref Range Status   Specimen Description WOUND  RIGHT FOOT ULCERATION  Final   Special Requests PT TAKING ZOSYN  Final   Gram Stain   Final    FEW WBC PRESENT, PREDOMINANTLY PMN NO ORGANISMS SEEN Performed at [facility], [address], [place], [place] [address]    Culture PENDING  Incomplete   Report Status PENDING  Incomplete       Radiology Studies: No results found.    Scheduled Meds: . amLODipine  10 mg Oral QHS  . cinacalcet  30 mg Oral Q supper  . [START ON 11/05/2017] darbepoetin (ARANESP) injection - DIALYSIS  60 mcg Intravenous Q Fri-HD  . heparin  5,000 Units Subcutaneous Q8H  . insulin aspart  0-5 Units Subcutaneous QHS  . insulin aspart  0-9 Units Subcutaneous TID WC  . insulin aspart  8 Units Subcutaneous TID AC  . insulin glargine  25 Units Subcutaneous QHS  . losartan  50 mg Oral QHS  . metoCLOPramide (REGLAN) injection  5 mg Intravenous Q8H  . multivitamin  1 tablet Oral QHS  . pantoprazole  40 mg Oral Daily  . sevelamer carbonate  3,200 mg Oral TID WC   Continuous Infusions: . piperacillin-tazobactam (ZOSYN)  IV 3.375 g (11/01/17 0856)  . vancomycin       LOS: 7 days    Time spent: 30 minutes   Dessa Phi, DO [facility] [email or web address] Password [facility] 11/01/2017, 12:33 PM

## 2017-11-01 NOTE — Progress Notes (Signed)
Patient off the unit now. Assessments remained unchanged prior leaving.Marland Kitchen

## 2017-11-01 NOTE — Procedures (Signed)
   I was present at this dialysis session, have reviewed the session itself and made  appropriate changes Kelly Splinter MD [place] pager [phone]   11/01/2017, 3:05 PM

## 2017-11-01 NOTE — Care Management Important Message (Signed)
Important Message  Patient Details  Name: [name] MRN: [id number] Date of Birth: [birth year]   Medicare Important Message Given:  Yes    [name] [name] 11/01/2017, 3:27 PM

## 2017-11-01 NOTE — Progress Notes (Signed)
[facility] Progress Note   Subjective:  Upset that HD people aren't here yet, it is 3 pm approx.  Says is signing out [place] after HD tonight.   Objective Vitals:   11/01/17 0134 11/01/17 0433 11/01/17 1000 11/01/17 1350  BP: 139/80 137/82 (!) 173/97 (!) 147/82  Pulse: 95 96 99 97  Resp: 20 18 18 18   Temp: 97.6 F (36.4 C) 98.5 F (36.9 C) 98.9 F (37.2 C) 99.2 F (37.3 C)  TempSrc: Oral Oral Oral Oral  SpO2: 100% 100% 100% 100%  Weight:      Height:       Filed Weights   10/27/17 1213 10/29/17 0930 10/29/17 1400  Weight: 71 kg (156 lb 8.4 oz) 76.1 kg (167 lb 12.3 oz) 70.4 kg (155 lb 3.3 oz)   Physical Exam WDWN AAM. NAD Many tattoos Walking the halls Regular S1S2 No S3 Abdomen soft, no tenderness.  No LE edema Wearing Darco shoe on R foot. Wrapped.  LLE fine. Dialysis Access: RU AVG   Recent Labs  Lab 10/28/17 0221  10/30/17 0550 10/31/17 0235 11/01/17 0435  NA 129*   < > 132* 130*  130* 135  K 4.5   < > 4.3 4.8  4.8 5.2*  CL 93*   < > 92* 93*  92* 96*  CO2 20*   < > 27 23  24 25   GLUCOSE 416*   < > 314* 262*  261* 125*  BUN 22*   < > 23* 39*  40* 55*  CREATININE 5.94*   < > 6.11* 7.95*  7.93* 9.55*  CALCIUM 9.0   < > 8.7* 8.9  8.9 8.8*  PHOS 3.0  --   --  4.1 5.9*   < > = values in this interval not displayed.    Recent Labs  Lab 10/25/17 1800 10/28/17 0221 10/31/17 0235 11/01/17 0435  AST 18  --   --   --   ALT 10*  --   --   --   ALKPHOS 114  --   --   --   BILITOT 2.0*  --   --   --   PROT 7.1  --   --   --   ALBUMIN 3.4* 3.0* 2.7* 2.7*    Recent Labs  Lab 10/25/17 1800  10/28/17 0759 10/29/17 0543 10/30/17 0550 10/31/17 0235 11/01/17 0435  WBC 24.7*   < > 21.7* 15.2* 13.9* 15.1* 13.7*  NEUTROABS 20.0*  --   --   --   --   --   --   HGB 10.3*   < > 9.9* 9.2* 10.0* 9.3* 9.1*  HCT 31.3*   < > 30.4* 28.6* 30.8* 28.5* 28.7*  MCV 94.3   < > 93.5 93.2 95.1 95.3 96.6  PLT 186   < > 245 261 278 302 360   < > =  values in this interval not displayed.       Component Value Date/Time   SDES WOUND RIGHT FOOT ULCERATION 10/31/2017 1159   SPECREQUEST PT TAKING ZOSYN 10/31/2017 1159   CULT PENDING 10/31/2017 1159   REPTSTATUS PENDING 10/31/2017 1159    Recent Labs  Lab 10/31/17 1823 10/31/17 2151 11/01/17 0133 11/01/17 0622 11/01/17 1131  GLUCAP 228* 183* 127* 116* 82   Studies/Results: No results found.  Medications: . sodium chloride    . sodium chloride    . piperacillin-tazobactam (ZOSYN)  IV Stopped (11/01/17 1359)  . vancomycin     .  amLODipine  10 mg Oral QHS  . cinacalcet  30 mg Oral Q supper  . [START ON 11/05/2017] darbepoetin (ARANESP) injection - DIALYSIS  60 mcg Intravenous Q Fri-HD  . heparin  5,000 Units Subcutaneous Q8H  . insulin aspart  0-5 Units Subcutaneous QHS  . insulin aspart  0-9 Units Subcutaneous TID WC  . insulin aspart  8 Units Subcutaneous TID AC  . insulin glargine  25 Units Subcutaneous QHS  . losartan  50 mg Oral QHS  . metoCLOPramide (REGLAN) injection  5 mg Intravenous Q8H  . multivitamin  1 tablet Oral QHS  . pantoprazole  40 mg Oral Daily  . 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  On Vanc & zosyn. WBC declining 2  ESRD - MWF. Post weight 70.5 kg, will be new edw approx 3  Anemia of CKD- Aranesp 60 started with HD 2/1 4  Secondary hyperparathyroidism - Labs in goal. Continue binders, oral calcitriol and sensipar. 5  HTN - BP controlled.  6. Nutrition - Renal diet with fluid restrictions.  7. Diabetes - on insulin per primary 8. Severe gastroparesis & diabetic diarrhea - per primary   Kelly Splinter MD [facility] pgr [phone]   11/01/2017, 3:03 PM

## 2017-11-02 DIAGNOSIS — R4182 Altered mental status, unspecified: Secondary | ICD-10-CM

## 2017-11-02 DIAGNOSIS — R410 Disorientation, unspecified: Secondary | ICD-10-CM

## 2017-11-02 DIAGNOSIS — M86179 Other acute osteomyelitis, unspecified ankle and foot: Secondary | ICD-10-CM

## 2017-11-02 DIAGNOSIS — Z008 Encounter for other general examination: Secondary | ICD-10-CM

## 2017-11-02 LAB — RENAL FUNCTION PANEL
Albumin: 2.6 g/dL — ABNORMAL LOW (ref 3.5–5.0)
Anion gap: 15 (ref 5–15)
BUN: 36 mg/dL — ABNORMAL HIGH (ref 6–20)
CO2: 26 mmol/L (ref 22–32)
Calcium: 9 mg/dL (ref 8.9–10.3)
Chloride: 94 mmol/L — ABNORMAL LOW (ref 101–111)
Creatinine, Ser: 7.02 mg/dL — ABNORMAL HIGH (ref 0.61–1.24)
GFR calc Af Amer: 10 mL/min — ABNORMAL LOW (ref >60)
GFR calc non Af Amer: 9 mL/min — ABNORMAL LOW (ref >60)
Glucose, Bld: 239 mg/dL — ABNORMAL HIGH (ref 65–99)
Phosphorus: 5.3 mg/dL — ABNORMAL HIGH (ref 2.5–4.6)
Potassium: 5.2 mmol/L — ABNORMAL HIGH (ref 3.5–5.1)
Sodium: 135 mmol/L (ref 135–145)

## 2017-11-02 LAB — CBC
HCT: 29.2 % — ABNORMAL LOW (ref 39.0–52.0)
Hemoglobin: 9 g/dL — ABNORMAL LOW (ref 13.0–17.0)
MCH: 30.3 pg (ref 26.0–34.0)
MCHC: 30.8 g/dL (ref 30.0–36.0)
MCV: 98.3 fL (ref 78.0–100.0)
Platelets: 395 10*3/uL (ref 150–400)
RBC: 2.97 MIL/uL — ABNORMAL LOW (ref 4.22–5.81)
RDW: 14.7 % (ref 11.5–15.5)
WBC: 13 10*3/uL — ABNORMAL HIGH (ref 4.0–10.5)

## 2017-11-02 LAB — GLUCOSE, CAPILLARY
Glucose-Capillary: 209 mg/dL — ABNORMAL HIGH (ref 65–99)
Glucose-Capillary: 43 mg/dL — CL (ref 65–99)
Glucose-Capillary: 69 mg/dL (ref 65–99)
Glucose-Capillary: 120 mg/dL — ABNORMAL HIGH (ref 65–99)
Glucose-Capillary: 325 mg/dL — ABNORMAL HIGH (ref 65–99)
Glucose-Capillary: 372 mg/dL — ABNORMAL HIGH (ref 65–99)
Glucose-Capillary: 233 mg/dL — ABNORMAL HIGH (ref 65–99)

## 2017-11-02 MED ORDER — INSULIN ASPART 100 UNIT/ML ~~LOC~~ SOLN
5.0000 [IU] | Freq: Three times a day (TID) | SUBCUTANEOUS | Status: DC
  Administered 2017-11-02 – 2017-11-03 (×3): 5 [IU] via SUBCUTANEOUS

## 2017-11-02 MED ORDER — DEXTROSE 50 % IV SOLN
25.0000 mL | Freq: Once | INTRAVENOUS | Status: AC
  Administered 2017-11-02: 25 mL via INTRAVENOUS
  Filled 2017-11-02: qty 50

## 2017-11-02 MED ORDER — INSULIN GLARGINE 100 UNIT/ML ~~LOC~~ SOLN
20.0000 [IU] | Freq: Every day | SUBCUTANEOUS | Status: DC
  Administered 2017-11-02: 20 [IU] via SUBCUTANEOUS
  Filled 2017-11-02 (×2): qty 0.2

## 2017-11-02 MED ORDER — METOCLOPRAMIDE HCL 5 MG PO TABS: 5.0000 mg | ORAL_TABLET | Freq: Three times a day (TID) | ORAL | Status: DC | PRN

## 2017-11-02 NOTE — Consult Note (Signed)
[facility] Consult   Reason for Consult:  HI Referring Physician:  Dr. Maylene Roes Patient Identification: [name] MRN:  [id number] Principal Diagnosis: Altered mental status Diagnosis:   Patient Active Problem List   Diagnosis Date Noted  . Ulcerated, foot, right, limited to breakdown of skin ([place]) [L97.511]   . Osteomyelitis of ankle or foot, acute, right ([place]) [M86.171] 10/30/2017  . Abscess or cellulitis of toe, right [L03.031, L02.611]   . Cellulitis of right lower extremity [L03.115]   . Right foot infection [L08.9]   . Diabetic foot infection ([place]) [U54.270, L08.9]   . Cellulitis of right foot [L03.115] 10/26/2017  . Diabetes mellitus type 1 ([place]) [E10.9] 10/25/2017  . Diabetic gastroparesis ([place]) [E11.43, K31.84] 10/25/2017  . Hemodialysis patient ([place]) [Z99.2] 10/25/2017  . Sepsis ([place]) [A41.9] 10/25/2017  . DKA (diabetic ketoacidoses) ([place]) [E13.10] 05/27/2015  . Essential (primary) hypertension [I10] 08/09/2014  . ESRD (end stage renal disease) on dialysis ([place]) [N18.6, Z99.2] 03/19/2014  . Pain, abdominal, generalized [R10.84] 03/19/2014  . Bacteremia due to coagulase-negative Staphylococcus [R78.81] 05/31/2013  . Leukocytosis [D72.829] 05/31/2013  . Hyponatremia [E87.1] 05/29/2013  . Metabolic acidosis [W23.7] 05/29/2013    Total Time spent with patient: 1 hour  Subjective:   [name] is a 37 y.o. male patient admitted with right foot/ankle osteomyelitis.  HPI:  Per chart review, patient is receiving treatment for right foot/ankle osteomyelitis and sepsis. He endorsed HI towards his child's mother to his nurse today. This appears to be in the setting of altered mental status. Labs are remarkable for creatinine of 7.02 and hemoglobin of 9. Glucose of 43 at the time he endorsed HI but it was corrected with Dextrose.   On interview, [name] reports that he does not remember making a statement about wanting to harm his child's mother. He reports  that he has not seen her for several years and she lives in another state. He denies SI, HI or AVH. He denies a psychiatric history. He denies problems with mood, sleep or appetite. A nurse was present at his bedside with his permission. She is a close friend. She reports that he appeared confused when he made those statements. He did not even remember her being in the room earlier.   Past Psychiatric History: Denies   Risk to Self: Is patient at risk for suicide?: No Risk to Others:  None. Denies HI. Prior Inpatient Therapy:  Denies  Prior Outpatient Therapy:  Denies   Past Medical History:  Past Medical History:  Diagnosis Date  . Diabetes mellitus without complication ([place])   . Diabetic gastroparesis ([place])   . Dialysis patient ([place])   . Hypertension   . Renal disorder    Dialysis  . Sepsis [facility])     Past Surgical History:  Procedure Laterality Date  . AV FISTULA PLACEMENT     left arm.  . AV FISTULA PLACEMENT Right 12/22/2016   Procedure: INSERTION OF ARTERIOVENOUS (AV) GORE-TEX GRAFT ARM;  Surgeon: Elam Dutch, MD;  Location: [facility];  Service: Vascular;  Laterality: Right;  . EYE SURGERY    . I&D EXTREMITY Right 10/31/2017   Procedure: IRRIGATION AND DEBRIDEMENT RIGHT FOOT;  Surgeon: Evelina Bucy, DPM;  Location: [place];  Service: Podiatry;  Laterality: Right;  . INSERTION OF DIALYSIS CATHETER     Right subclavian  . UPPER EXTREMITY VENOGRAPHY N/A 11/16/2016   Procedure: Upper Extremity Venography - Right Central;  Surgeon: Elam Dutch, MD;  Location: [facility]  INVASIVE CV LAB;  Service: Cardiovascular;  Laterality: N/A;   Family History:  Family History  Problem Relation Age of Onset  . Diabetes Mellitus II Unknown    Family Psychiatric  History: Unknown  Social History:  Social History   Substance and Sexual Activity  Alcohol Use No     Social History   Substance and Sexual Activity  Drug Use No    Social History   Socioeconomic History  . Marital status:  Single    Spouse name: None  . Number of children: None  . Years of education: None  . Highest education level: None  Social Needs  . Financial resource strain: None  . Food insecurity - worry: None  . Food insecurity - inability: None  . Transportation needs - medical: None  . Transportation needs - non-medical: None  Occupational History  . None  Tobacco Use  . Smoking status: Never Smoker  . Smokeless tobacco: Never Used  Substance and Sexual Activity  . Alcohol use: No  . Drug use: No  . Sexual activity: No  Other Topics Concern  . None  Social History Narrative  . None   Additional Social History: He lives in [place]. He has 2 children and one child lives in [place]. He denies substance use.     Allergies:   Allergies  Allergen Reactions  . No Known Allergies     Labs:  Results for orders placed or performed during the hospital encounter of 10/25/17 (from the past 48 hour(s))  Glucose, capillary     Status: Abnormal   Collection Time: 10/31/17  1:56 PM  Result Value Ref Range   Glucose-Capillary 130 (H) 65 - 99 mg/dL   Comment 1 Notify RN    Comment 2 Document in Chart   Glucose, capillary     Status: Abnormal   Collection Time: 10/31/17  6:23 PM  Result Value Ref Range   Glucose-Capillary 228 (H) 65 - 99 mg/dL  Glucose, capillary     Status: Abnormal   Collection Time: 10/31/17  9:51 PM  Result Value Ref Range   Glucose-Capillary 183 (H) 65 - 99 mg/dL   Comment 1 Notify RN    Comment 2 Document in Chart   Glucose, capillary     Status: Abnormal   Collection Time: 11/01/17  1:33 AM  Result Value Ref Range   Glucose-Capillary 127 (H) 65 - 99 mg/dL   Comment 1 Notify RN    Comment 2 Document in Chart   CBC     Status: Abnormal   Collection Time: 11/01/17  4:35 AM  Result Value Ref Range   WBC 13.7 (H) 4.0 - 10.5 K/uL   RBC 2.97 (L) 4.22 - 5.81 MIL/uL   Hemoglobin 9.1 (L) 13.0 - 17.0 g/dL   HCT 28.7 (L) 39.0 - 52.0 %   MCV 96.6 78.0 - 100.0 fL    MCH 30.6 26.0 - 34.0 pg   MCHC 31.7 30.0 - 36.0 g/dL   RDW 14.6 11.5 - 15.5 %   Platelets 360 150 - 400 K/uL    Comment: Performed at [facility], [place] [address] [place], [place] [address]  Renal function panel     Status: Abnormal   Collection Time: 11/01/17  4:35 AM  Result Value Ref Range   Sodium 135 135 - 145 mmol/L   Potassium 5.2 (H) 3.5 - 5.1 mmol/L   Chloride 96 (L) 101 - 111 mmol/L   CO2 25 22 -  32 mmol/L   Glucose, Bld 125 (H) 65 - 99 mg/dL   BUN 55 (H) 6 - 20 mg/dL   Creatinine, Ser 9.55 (H) 0.61 - 1.24 mg/dL   Calcium 8.8 (L) 8.9 - 10.3 mg/dL   Phosphorus 5.9 (H) 2.5 - 4.6 mg/dL   Albumin 2.7 (L) 3.5 - 5.0 g/dL   GFR calc non Af Amer 6 (L) >60 mL/min   GFR calc Af Amer 7 (L) >60 mL/min    Comment: (NOTE) The eGFR has been calculated using the CKD EPI equation. This calculation has not been validated in all clinical situations. eGFR's persistently <60 mL/min signify possible Chronic Kidney Disease.    Anion gap 14 5 - 15    Comment: Performed at [facility] [address] [place], [place] [address]  Glucose, capillary     Status: Abnormal   Collection Time: 11/01/17  6:22 AM  Result Value Ref Range   Glucose-Capillary 116 (H) 65 - 99 mg/dL   Comment 1 Notify RN    Comment 2 Document in Chart   Glucose, capillary     Status: None   Collection Time: 11/01/17 11:31 AM  Result Value Ref Range   Glucose-Capillary 82 65 - 99 mg/dL  Glucose, capillary     Status: Abnormal   Collection Time: 11/01/17  8:11 PM  Result Value Ref Range   Glucose-Capillary 216 (H) 65 - 99 mg/dL  Glucose, capillary     Status: Abnormal   Collection Time: 11/01/17  9:43 PM  Result Value Ref Range   Glucose-Capillary 206 (H) 65 - 99 mg/dL   Comment 1 Notify RN    Comment 2 Document in Chart   CBC     Status: Abnormal   Collection Time: 11/02/17  5:44 AM  Result Value Ref Range   WBC 13.0 (H) 4.0 - 10.5 K/uL   RBC 2.97 (L) 4.22 - 5.81 MIL/uL   Hemoglobin 9.0 (L) 13.0  - 17.0 g/dL   HCT 29.2 (L) 39.0 - 52.0 %   MCV 98.3 78.0 - 100.0 fL   MCH 30.3 26.0 - 34.0 pg   MCHC 30.8 30.0 - 36.0 g/dL   RDW 14.7 11.5 - 15.5 %   Platelets 395 150 - 400 K/uL    Comment: Performed at [facility], [place] [address] [place], [place] [address]  Renal function panel     Status: Abnormal   Collection Time: 11/02/17  5:44 AM  Result Value Ref Range   Sodium 135 135 - 145 mmol/L   Potassium 5.2 (H) 3.5 - 5.1 mmol/L   Chloride 94 (L) 101 - 111 mmol/L   CO2 26 22 - 32 mmol/L   Glucose, Bld 239 (H) 65 - 99 mg/dL   BUN 36 (H) 6 - 20 mg/dL   Creatinine, Ser 7.02 (H) 0.61 - 1.24 mg/dL   Calcium 9.0 8.9 - 10.3 mg/dL   Phosphorus 5.3 (H) 2.5 - 4.6 mg/dL   Albumin 2.6 (L) 3.5 - 5.0 g/dL   GFR calc non Af Amer 9 (L) >60 mL/min   GFR calc Af Amer 10 (L) >60 mL/min    Comment: (NOTE) The eGFR has been calculated using the CKD EPI equation. This calculation has not been validated in all clinical situations. eGFR's persistently <60 mL/min signify possible Chronic Kidney Disease.    Anion gap 15 5 - 15    Comment: Performed at [facility] [address], [place], [place] [address]  Glucose, capillary     Status: Abnormal   Collection Time: 11/02/17  5:57 AM  Result Value Ref Range   Glucose-Capillary 209 (H) 65 - 99 mg/dL   Comment 1 Notify RN    Comment 2 Document in Chart   Glucose, capillary     Status: Abnormal   Collection Time: 11/02/17 10:45 AM  Result Value Ref Range   Glucose-Capillary 43 (LL) 65 - 99 mg/dL   Comment 1 Notify RN    Comment 2 Document in Chart   Glucose, capillary     Status: None   Collection Time: 11/02/17 11:09 AM  Result Value Ref Range   Glucose-Capillary 69 65 - 99 mg/dL   Comment 1 Notify RN    Comment 2 Document in Chart   Glucose, capillary     Status: Abnormal   Collection Time: 11/02/17 12:11 PM  Result Value Ref Range   Glucose-Capillary 120 (H) 65 - 99 mg/dL    Current Facility-Administered Medications   Medication Dose Route Frequency Provider Last Rate Last Dose  . acetaminophen (TYLENOL) tablet 650 mg  650 mg Oral Q6H PRN Fuller Plan A, MD   650 mg at 10/27/17 0148   Or  . acetaminophen (TYLENOL) suppository 650 mg  650 mg Rectal Q6H PRN Fuller Plan A, MD      . amLODipine (NORVASC) tablet 10 mg  10 mg Oral Nile Riggs, MD   10 mg at 11/01/17 2211  . cinacalcet (SENSIPAR) tablet 30 mg  30 mg Oral Q supper Fuller Plan A, MD   30 mg at 10/31/17 1731  . clonazePAM (KLONOPIN) tablet 0.5 mg  0.5 mg Oral BID PRN Reyne Dumas, MD   0.5 mg at 11/02/17 0025  . [START ON 11/05/2017] Darbepoetin Alfa (ARANESP) injection 60 mcg  60 mcg Intravenous Q Fri-HD Jamal Maes, MD      . heparin injection 5,000 Units  5,000 Units Subcutaneous Q8H Smith, Rondell A, MD      . HYDROmorphone (DILAUDID) injection 0.5 mg  0.5 mg Intravenous Q4H PRN Dessa Phi, DO   0.5 mg at 11/02/17 6599  . insulin aspart (novoLOG) injection 0-5 Units  0-5 Units Subcutaneous QHS Schorr, Rhetta Mura, NP   2 Units at 11/01/17 2229  . insulin aspart (novoLOG) injection 0-9 Units  0-9 Units Subcutaneous TID WC Reyne Dumas, MD   3 Units at 11/02/17 [phone]  . insulin aspart (novoLOG) injection 5 Units  5 Units Subcutaneous TID AC Choi, Jennifer, DO      . insulin glargine (LANTUS) injection 20 Units  20 Units Subcutaneous QHS Dessa Phi, DO      . ipratropium-albuterol (DUONEB) 0.5-2.5 (3) MG/3ML nebulizer solution 3 mL  3 mL Nebulization Q4H PRN Smith, Rondell A, MD      . losartan (COZAAR) tablet 50 mg  50 mg Oral Nile Riggs, MD   50 mg at 11/01/17 2212  . metoCLOPramide (REGLAN) tablet 5 mg  5 mg Oral Q8H PRN Dessa Phi, DO      . multivitamin (RENA-VIT) tablet 1 tablet  1 tablet Oral Nile Riggs, MD   1 tablet at 11/01/17 2212  . ondansetron (ZOFRAN) tablet 4 mg  4 mg Oral Q6H PRN Fuller Plan A, MD   4 mg at 10/30/17 0759   Or  . ondansetron (ZOFRAN) injection 4 mg  4 mg  Intravenous Q6H PRN Fuller Plan A, MD   4 mg at 10/31/17 1245  . oxyCODONE-acetaminophen (PERCOCET/ROXICET) 5-325 MG  per tablet 1 tablet  1 tablet Oral Q6H PRN Reyne Dumas, MD   1 tablet at 11/02/17 0813  . pantoprazole (PROTONIX) EC tablet 40 mg  40 mg Oral Daily Dessa Phi, DO   40 mg at 11/01/17 0315  . piperacillin-tazobactam (ZOSYN) IVPB 3.375 g  3.375 g Intravenous Q12H Dessa Phi, DO 12.5 mL/hr at 11/02/17 0804 3.375 g at 11/02/17 0804  . povidone-iodine (BETADINE) 10 % external solution   Topical PRN Dessa Phi, DO      . sevelamer carbonate (RENVELA) tablet 1,600 mg  1,600 mg Oral PRN Reyne Dumas, MD   1,600 mg at 10/30/17 2220  . sevelamer carbonate (RENVELA) tablet 3,200 mg  3,200 mg Oral TID WC Jamal Maes, MD   3,200 mg at 11/02/17 0803  . vancomycin (VANCOCIN) IVPB 750 mg/150 ml premix  750 mg Intravenous Q M,W,F-HD Dessa Phi, DO   Stopped at 11/01/17 2011    Musculoskeletal: Strength & Muscle Tone: within normal limits Gait & Station: UTA since patient was lying in bed. Patient leans: N/A  Psychiatric Specialty Exam: Physical Exam  Nursing note and vitals reviewed. Constitutional: He is oriented to person, place, and time. He appears well-developed and well-nourished.  HENT:  Head: Normocephalic and atraumatic.  Neck: Normal range of motion.  Respiratory: Effort normal.  Musculoskeletal: Normal range of motion.  Neurological: He is alert and oriented to person, place, and time.  Skin: No rash noted.  Psychiatric: He has a normal mood and affect. His speech is normal and behavior is normal. Judgment and thought content normal. Cognition and memory are normal.    Review of Systems  Psychiatric/Behavioral: Negative for depression, hallucinations, substance abuse and suicidal ideas. The patient is not nervous/anxious and does not have insomnia.   All other systems reviewed and are negative.   Blood pressure (!) 164/89, pulse 94, temperature 98.2  F (36.8 C), temperature source Oral, resp. rate 18, height '5\' 6"'  (1.676 m), weight 76 kg (167 lb 8.8 oz), SpO2 100 %.Body mass index is 27.04 kg/m.  General Appearance: Well Groomed, young, African American male with facial tattoo, wearing paper hospital pants and lying in bed. NAD.   Eye Contact:  Good  Speech:  Clear and Coherent and Normal Rate  Volume:  Normal  Mood:  Euthymic  Affect:  Congruent and Full Range  Thought Process:  Goal Directed and Linear  Orientation:  Full (Time, Place, and Person)  Thought Content:  Logical  Suicidal Thoughts:  No  Homicidal Thoughts:  No  Memory:  Immediate;   Good Recent;   Good Remote;   Good  Judgement:  Good  Insight:  Good  Psychomotor Activity:  Normal  Concentration:  Concentration: Good and Attention Span: Good  Recall:  Good  Fund of Knowledge:  Good  Language:  Good  Akathisia:  No  Handed:  Left  AIMS (if indicated):   N/A  Assets:  Communication Skills Housing Social Support  ADL's:  Intact  Cognition:  WNL  Sleep:   Okay   Assessment: [name] is a 37 y.o. male admitted with right foot/ankle osteomyelitis. Psychiatry was consulted after patient endorsed HI in the setting of hypoglycemia. Patient denies remembering making this statement. He has no contact with this particular person. He denies SI, HI or AVH. He does not warrant inpatient psychiatric hospitalization.   Treatment Plan Summary: -Patient is psychiatrically cleared. Psychiatry will sign off patient at this time. Please consult psychiatry again as needed.  Disposition: No evidence of imminent risk to self or others at present.   Patient does not meet criteria for psychiatric inpatient admission.  Faythe Dingwall, DO 11/02/2017 1:44 PM

## 2017-11-02 NOTE — Progress Notes (Signed)
Inpatient Diabetes Program Recommendations  AACE/ADA: New Consensus Statement on Inpatient Glycemic Control (2015)  Target Ranges:  Prepandial:   less than 140 mg/dL      Peak postprandial:   less than 180 mg/dL (1-2 hours)      Critically ill patients:  140 - 180 mg/dL   Results for TYLEE, NEWBY (MRN [id number]) as of 11/02/2017 11:54  Ref. Range 11/01/2017 06:22 11/01/2017 11:31 11/01/2017 20:11 11/01/2017 21:43 11/02/2017 05:57 11/02/2017 10:45 11/02/2017 11:09  Glucose-Capillary Latest Ref Range: 65 - 99 mg/dL 116 (H)  Novolog 8 units (meal cov) 82 216 (H) 206 (H)  Novolog 2 units (SSI)  Lantus 25 units 209 (H)  Novolog 3 units @6 :52 (SSI)  Novolog 8 units @8 :03 (meal cov) 43 (LL) 69   Review of Glycemic Control  Current orders for Inpatient glycemic control: Lantus 20 units QHS, Novolog 8 units TID with meals, Novolog 0-9 units TID with meals, Novolog 0-5 units QHS  Inpatient Diabetes Program Recommendations:  Insulin - Basal: Noted Lantus was decreased from 25 to 20 units today.   Insulin - Meal Coverage: Patient only received Novolog meal coverage with breakfast on 11/01/17. Noted glucose down to 43 mg/dl today at 10:45 today. Anticipate hypoglycemia today was due to Novolog (received 3 units at 6:52 for glucose of 209 mg/dl at 5:57 am and Novolog 8 units at 8:03 am for breakfast coverage).  Novolog 8 units may be too much Novolog meal coverage especially if patient is not eating entire meal.  May want to consider decreasing Novolog meal coverage to 5 units TID with meals if patient eats at least 50% of meals.  Thanks, Barnie Alderman, RN, MSN, CDE Diabetes Coordinator Inpatient Diabetes Program [phone] (Team Pager from 8am to 5pm)

## 2017-11-02 NOTE — Progress Notes (Signed)
[facility] Progress Note   Subjective:  Acting strangely this am, says he has been seeing a "man in a red cape" flying around outside.  No c/o , no cough or SOB>   Objective Vitals:   11/01/17 2100 11/02/17 0149 11/02/17 0522 11/02/17 0935  BP: 137/75 (!) 147/88 (!) 167/87 (!) 164/89  Pulse: 95 92 91 94  Resp: 18 18 18 18   Temp: 100 F (37.8 C) 98.6 F (37 C) 98.6 F (37 C) 98.2 F (36.8 C)  TempSrc: Oral Oral Oral Oral  SpO2: 100% 99% 97% 100%  Weight:      Height:       Filed Weights   10/29/17 1400 11/01/17 1500 11/01/17 1900  Weight: 70.4 kg (155 lb 3.3 oz) 81.4 kg (179 lb 7.3 oz) 76 kg (167 lb 8.8 oz)   Physical Exam WDWN AAM. NAD Many tattoos No distress, tearful Regular S1S2 No S3 Abdomen soft, no tenderness.  No LE edema Wearing Darco shoe on R foot. Wrapped.  LLE fine. Dialysis Access: RU AVG   Recent Labs  Lab 10/31/17 0235 11/01/17 0435 11/02/17 0544  NA 130*  130* 135 135  K 4.8  4.8 5.2* 5.2*  CL 93*  92* 96* 94*  CO2 23  24 25 26   GLUCOSE 262*  261* 125* 239*  BUN 39*  40* 55* 36*  CREATININE 7.95*  7.93* 9.55* 7.02*  CALCIUM 8.9  8.9 8.8* 9.0  PHOS 4.1 5.9* 5.3*    Recent Labs  Lab 10/31/17 0235 11/01/17 0435 11/02/17 0544  ALBUMIN 2.7* 2.7* 2.6*    Recent Labs  Lab 10/29/17 0543 10/30/17 0550 10/31/17 0235 11/01/17 0435 11/02/17 0544  WBC 15.2* 13.9* 15.1* 13.7* 13.0*  HGB 9.2* 10.0* 9.3* 9.1* 9.0*  HCT 28.6* 30.8* 28.5* 28.7* 29.2*  MCV 93.2 95.1 95.3 96.6 98.3  PLT 261 278 302 360 395       Component Value Date/Time   SDES WOUND RIGHT FOOT ULCERATION 10/31/2017 1159   SPECREQUEST PT TAKING ZOSYN 10/31/2017 1159   CULT PENDING 10/31/2017 1159   REPTSTATUS PENDING 10/31/2017 1159    Recent Labs  Lab 11/01/17 2143 11/02/17 0557 11/02/17 1045 11/02/17 1109 11/02/17 1211  GLUCAP 206* 209* 43* 69 120*   Studies/Results: No results found.  Medications: . piperacillin-tazobactam (ZOSYN)   IV 3.375 g (11/02/17 0804)  . vancomycin Stopped (11/01/17 2011)   . amLODipine  10 mg Oral QHS  . cinacalcet  30 mg Oral Q supper  . [START ON 11/05/2017] darbepoetin (ARANESP) injection - DIALYSIS  60 mcg Intravenous Q Fri-HD  . heparin  5,000 Units Subcutaneous Q8H  . insulin aspart  0-5 Units Subcutaneous QHS  . insulin aspart  0-9 Units Subcutaneous TID WC  . insulin aspart  5 Units Subcutaneous TID AC  . insulin glargine  20 Units Subcutaneous QHS  . losartan  50 mg Oral QHS  . multivitamin  1 tablet Oral QHS  . pantoprazole  40 mg Oral Daily  . sevelamer carbonate  3,200 mg Oral TID WC    Dialysis Orders: MWF [facility] 4h   75.5kg  (will have lower EDW at discharge)  400/800  2/2.25 bath  LUA AVG  Hep 9000 mircera 41mcg IV q2wks - last 1/22 Calcitriol 1.59mcg PO qHD   Assessment: 1  R foot osteo sp I&D 2/3 2  ESRD HD mwf 3  HTN stabel 4  DM on insulin 5  Hallucinations, per primary 6  Anemia ckd, started  darbe 60 /wk on 2/1 7  Severe gastroparesis & diab diarrhea 8  Vol stable, at dry wt   PLan - HD Wed, on sched    Kelly Splinter MD [employer] pgr [phone]   11/02/2017, 1:22 PM

## 2017-11-02 NOTE — Progress Notes (Signed)
PROGRESS NOTE    [name]  CHE:[id number] DOB: 24-Oct-1980 DOA: 10/25/2017 PCP: Patient, No Pcp Per     Brief Narrative:  [name] is a 37 y.o. male with medical history significant of DM type I, ESRD on HD, and diabetic foot ulcer followed by Dr. March Rummage; who presents with complaints of right foot swelling.  During hemodialysis, it was noticed that he had swelling and red discoloration of his right foot.  He states that he has no feeling in his feet, and did not noticed the symptoms until they pointed out.  He does not recall any recent trauma or injury to his foot.  Patient has a known ulceration of the lateral aspect of the right foot that he states has been there for years.  He had just recently reestablished care and have been being followed by Dr. March Rummage of podiatry.  He was made a urgent appointment at his podiatrist's office and was seen by Dr. Daylene Katayama.  X-rays were obtained which showed no signs of osseous involvement, but he was sent to the emergency department for need of IV antibiotics. Dr. March Rummage evaluated patient in the hospital. MRI revealed osteomyelitis. Patient underwent irrigation and debridement on 2/3 in the OR.   Assessment & Plan:   Principal Problem:   Osteomyelitis of ankle or foot, acute, right ([name]) Active Problems:   Diabetes mellitus type 1 ([name])   ESRD (end stage renal disease) on dialysis ([place])   Essential (primary) hypertension   Sepsis ([name])   Cellulitis of right foot   Abscess or cellulitis of toe, right   Cellulitis of right lower extremity   Right foot infection   Diabetic foot infection ([name])   Ulcerated, foot, right, limited to breakdown of skin ([name])   Sepsis 2/2 osteomyelitis ofright foot,diabetic ulcer -On presentation, tachycardic with fever up to 104.1 F, WBC 25.2, and lactic acid 2.03 -Continue empiric antibiotics of vancomycin and Zosyn -ABI unable to obtain due to non-compressible arteries likely due to calcification    -Consulted Dr. March Rummage, underwent bedside I&D right foot abscess  -MRI revealed osteomyelitis  -S/p irrigation and debridement by Dr. March Rummage 2/3 -Await culture results, continue to trend WBC  -Home health RN for every other day packing changes, aquacell packing to the 1st interspace. CM consulted   ESRD on HD -Nephrology consulted  Concern for STD -HIV NR  -GC/Chlamydia negative  -Discussed symptoms to look out for. He is symptom free at this time but worried about exposure   Diabetes mellitus type1 -Ha1c 6.2  -Continue Lantus/NovoLog and sliding scale insulin  Essential hypertension -Continue amlodipine, losartan  Anxiety -Continue Klonopintwice a day prn  Intractable nausea -Improved   Diarrhea -After receiving kayexalate. GI PCR negative. No further diarrhea   DVT prophylaxis: subq hep Code Status: Full Family Communication: No family at bedside Disposition Plan: Pending surgical wound culture results, resolution of leukocytosis    Consultants:   Podiatry  Procedures:   Bedside I&D right foot abscess 1/30 Dr. March Rummage   Incision and drainage R 1st interspace, excisional debridement lateral foot wound 2/3 Dr. March Rummage   Antimicrobials:  Anti-infectives (From admission, onward)   Start     Dose/Rate Route Frequency Ordered Stop   11/01/17 1615  Vancomycin (VANCOCIN) 750-5 MG/150ML-% IVPB    Comments:  Cherylann Banas   : cabinet override      11/01/17 1615 11/01/17 1807   11/01/17 1200  vancomycin (VANCOCIN) IVPB 750 mg/150 ml premix     750  mg 150 mL/hr over 60 Minutes Intravenous Every M-W-F (Hemodialysis) 10/30/17 0726     10/31/17 1219  vancomycin (VANCOCIN) powder  Status:  Discontinued       As needed 10/31/17 1220 10/31/17 1221   10/30/17 0800  piperacillin-tazobactam (ZOSYN) IVPB 3.375 g     3.375 g 12.5 mL/hr over 240 Minutes Intravenous Every 12 hours 10/30/17 0726     10/29/17 1200  vancomycin (VANCOCIN) IVPB 750 mg/150 ml premix  Status:   Discontinued     750 mg 150 mL/hr over 60 Minutes Intravenous Every M-W-F (Hemodialysis) 10/27/17 1158 10/30/17 0726   10/29/17 1116  Vancomycin (VANCOCIN) 750-5 MG/150ML-% IVPB  Status:  Discontinued    Comments:  Cherylann Banas   : cabinet override      10/29/17 1116 10/29/17 1119   10/29/17 0000  ciprofloxacin (CIPRO) 500 MG tablet     500 mg Oral 2 times daily 10/29/17 1159 11/12/17 2359   10/29/17 0000  clindamycin (CLEOCIN) 300 MG capsule     300 mg Oral 4 times daily 10/29/17 1159 11/12/17 2359   10/27/17 1200  vancomycin (VANCOCIN) IVPB 1000 mg/200 mL premix  Status:  Discontinued     1,000 mg 200 mL/hr over 60 Minutes Intravenous Every M-W-F (Hemodialysis) 10/25/17 2120 10/27/17 1158   10/26/17 0800  piperacillin-tazobactam (ZOSYN) IVPB 3.375 g  Status:  Discontinued     3.375 g 12.5 mL/hr over 240 Minutes Intravenous Every 12 hours 10/25/17 2117 10/30/17 0726   10/25/17 2015  piperacillin-tazobactam (ZOSYN) IVPB 3.375 g     3.375 g 100 mL/hr over 30 Minutes Intravenous  Once 10/25/17 2012 10/25/17 2100   10/25/17 2015  vancomycin (VANCOCIN) IVPB 1000 mg/200 mL premix     1,000 mg 200 mL/hr over 60 Minutes Intravenous  Once 10/25/17 2012 10/25/17 2126       Subjective: Multiple complaints today. Wants to go home but ended up not leaving AMA. He does not understand why he needs to stay for culture results. We discussed this at length, numerous times, with poor understanding from patient. He is still upset he was not first shift for HD yesterday and wants to know who he needs to speak with about this. He thinks he will still sign out AMA. We discussed risks of leaving and benefit of staying.    Objective: Vitals:   11/01/17 2100 11/02/17 0149 11/02/17 0522 11/02/17 0935  BP: 137/75 (!) 147/88 (!) 167/87 (!) 164/89  Pulse: 95 92 91 94  Resp: 18 18 18 18   Temp: 100 F (37.8 C) 98.6 F (37 C) 98.6 F (37 C) 98.2 F (36.8 C)  TempSrc: Oral Oral Oral Oral  SpO2: 100% 99% 97%  100%  Weight:      Height:        Intake/Output Summary (Last 24 hours) at 11/02/2017 1022 Last data filed at 11/02/2017 0300 Gross per 24 hour  Intake 350 ml  Output 4500 ml  Net -4150 ml   Filed Weights   10/29/17 1400 11/01/17 1500 11/01/17 1900  Weight: 70.4 kg (155 lb 3.3 oz) 81.4 kg (179 lb 7.3 oz) 76 kg (167 lb 8.8 oz)    Examination:  General exam: Appears calm and comfortable  Psychiatry: Judgement and insight appear poor   Data Reviewed: I have personally reviewed following labs and imaging studies  CBC: Recent Labs  Lab 10/29/17 0543 10/30/17 0550 10/31/17 0235 11/01/17 0435 11/02/17 0544  WBC 15.2* 13.9* 15.1* 13.7* 13.0*  HGB 9.2* 10.0* 9.3*  9.1* 9.0*  HCT 28.6* 30.8* 28.5* 28.7* 29.2*  MCV 93.2 95.1 95.3 96.6 98.3  PLT 261 278 302 360 025   Basic Metabolic Panel: Recent Labs  Lab 10/27/17 0255 10/28/17 0221 10/29/17 0543 10/30/17 0550 10/31/17 0235 11/01/17 0435 11/02/17 0544  NA 128* 129* 132* 132* 130*  130* 135 135  K 4.8 4.5 4.2 4.3 4.8  4.8 5.2* 5.2*  CL 91* 93* 96* 92* 93*  92* 96* 94*  CO2 17* 20* 23 27 23  24 25 26   GLUCOSE 352* 416* 172* 314* 262*  261* 125* 239*  BUN 36* 22* 36* 23* 39*  40* 55* 36*  CREATININE 8.74* 5.94* 8.09* 6.11* 7.95*  7.93* 9.55* 7.02*  CALCIUM 9.3 9.0 8.8* 8.7* 8.9  8.9 8.8* 9.0  MG 2.3  --   --   --   --   --   --   PHOS 5.3* 3.0  --   --  4.1 5.9* 5.3*   GFR: Estimated Creatinine Clearance: 12.9 mL/min (A) (by C-G formula based on SCr of 7.02 mg/dL (H)). Liver Function Tests: Recent Labs  Lab 10/28/17 0221 10/31/17 0235 11/01/17 0435 11/02/17 0544  ALBUMIN 3.0* 2.7* 2.7* 2.6*   No results for input(s): LIPASE, AMYLASE in the last 168 hours. No results for input(s): AMMONIA in the last 168 hours. Coagulation Profile: No results for input(s): INR, PROTIME in the last 168 hours. Cardiac Enzymes: No results for input(s): CKTOTAL, CKMB, CKMBINDEX, TROPONINI in the last 168 hours. BNP (last 3  results) No results for input(s): PROBNP in the last 8760 hours. HbA1C: No results for input(s): HGBA1C in the last 72 hours. CBG: Recent Labs  Lab 11/01/17 0622 11/01/17 1131 11/01/17 2011 11/01/17 2143 11/02/17 0557  GLUCAP 116* 82 216* 206* 209*   Lipid Profile: No results for input(s): CHOL, HDL, LDLCALC, TRIG, CHOLHDL, LDLDIRECT in the last 72 hours. Thyroid Function Tests: No results for input(s): TSH, T4TOTAL, FREET4, T3FREE, THYROIDAB in the last 72 hours. Anemia Panel: No results for input(s): VITAMINB12, FOLATE, FERRITIN, TIBC, IRON, RETICCTPCT in the last 72 hours. Sepsis Labs: No results for input(s): PROCALCITON, LATICACIDVEN in the last 168 hours.  Recent Results (from the past 240 hour(s))  Culture, blood (Routine x 2)     Status: None   Collection Time: 10/25/17  6:00 PM  Result Value Ref Range Status   Specimen Description BLOOD BLOOD LEFT FOREARM  Final   Special Requests   Final    BOTTLES DRAWN AEROBIC AND ANAEROBIC Blood Culture adequate volume   Culture   Final    NO GROWTH 5 DAYS Performed at [facility], [address], [place], [place] [address]    Report Status 10/30/2017 FINAL  Final  Culture, blood (Routine x 2)     Status: None   Collection Time: 10/25/17  7:45 PM  Result Value Ref Range Status   Specimen Description BLOOD LEFT HAND  Final   Special Requests IN PEDIATRIC BOTTLE Blood Culture adequate volume  Final   Culture   Final    NO GROWTH 5 DAYS Performed at [facility], [place] [address] [place], [place] [address]    Report Status 10/30/2017 FINAL  Final  Gastrointestinal Panel by PCR , Stool     Status: None   Collection Time: 10/26/17  1:00 PM  Result Value Ref Range Status   Campylobacter species NOT DETECTED NOT DETECTED Final   Plesimonas shigelloides NOT DETECTED NOT DETECTED Final  Salmonella species NOT DETECTED NOT DETECTED Final   Yersinia enterocolitica NOT DETECTED NOT DETECTED Final   Vibrio species NOT  DETECTED NOT DETECTED Final   Vibrio cholerae NOT DETECTED NOT DETECTED Final   Enteroaggregative E coli (EAEC) NOT DETECTED NOT DETECTED Final   Enteropathogenic E coli (EPEC) NOT DETECTED NOT DETECTED Final   Enterotoxigenic E coli (ETEC) NOT DETECTED NOT DETECTED Final   Shiga like toxin producing E coli (STEC) NOT DETECTED NOT DETECTED Final   Shigella/Enteroinvasive E coli (EIEC) NOT DETECTED NOT DETECTED Final   Cryptosporidium NOT DETECTED NOT DETECTED Final   Cyclospora cayetanensis NOT DETECTED NOT DETECTED Final   Entamoeba histolytica NOT DETECTED NOT DETECTED Final   Giardia lamblia NOT DETECTED NOT DETECTED Final   Adenovirus F40/41 NOT DETECTED NOT DETECTED Final   Astrovirus NOT DETECTED NOT DETECTED Final   Norovirus GI/GII NOT DETECTED NOT DETECTED Final   Rotavirus A NOT DETECTED NOT DETECTED Final   Sapovirus (I, II, IV, and V) NOT DETECTED NOT DETECTED Final    Comment: Performed at [facility], [address] [place], [place] [address]  MRSA PCR Screening     Status: Abnormal   Collection Time: 10/26/17  5:22 PM  Result Value Ref Range Status   MRSA by PCR INVALID RESULTS, SPECIMEN SENT FOR CULTURE (A) NEGATIVE Final    Comment:        The GeneXpert MRSA Assay (FDA approved for NASAL specimens only), is one component of a comprehensive MRSA colonization surveillance program. It is not intended to diagnose MRSA infection nor to guide or monitor treatment for MRSA infections. RESULT CALLED TO, READ BACK BY AND VERIFIED WITH: RN Nona Dell [id number] [phone]   MRSA culture     Status: Abnormal   Collection Time: 10/26/17  8:00 PM  Result Value Ref Range Status   Specimen Description NASAL SWAB  Final   Special Requests   Final    NONE Performed at [facility], [address], [place], [place] [address]    Culture STAPHYLOCOCCUS AUREUS (A)  Final   Report Status 10/30/2017 FINAL  Final   Organism ID, Bacteria STAPHYLOCOCCUS AUREUS  Final       Susceptibility   Staphylococcus aureus - MIC*    CIPROFLOXACIN >=8 RESISTANT Resistant     ERYTHROMYCIN <=0.25 SENSITIVE Sensitive     GENTAMICIN <=0.5 SENSITIVE Sensitive     OXACILLIN 0.5 SENSITIVE Sensitive     TETRACYCLINE <=1 SENSITIVE Sensitive     VANCOMYCIN 1 SENSITIVE Sensitive     TRIMETH/SULFA <=10 SENSITIVE Sensitive     CLINDAMYCIN <=0.25 SENSITIVE Sensitive     RIFAMPIN <=0.5 SENSITIVE Sensitive     Inducible Clindamycin NEGATIVE Sensitive     * STAPHYLOCOCCUS AUREUS  MRSA PCR Screening     Status: None   Collection Time: 10/28/17 10:25 AM  Result Value Ref Range Status   MRSA by PCR NEGATIVE NEGATIVE Final    Comment:        The GeneXpert MRSA Assay (FDA approved for NASAL specimens only), is one component of a comprehensive MRSA colonization surveillance program. It is not intended to diagnose MRSA infection nor to guide or monitor treatment for MRSA infections.   Aerobic/Anaerobic Culture (surgical/deep wound)     Status: None (Preliminary result)   Collection Time: 10/31/17 11:56 AM  Result Value Ref Range Status   Specimen Description TISSUE RIGHT FOOT ULCERATION  Final   Special Requests PT TAKING ZOSYIN  Final  Gram Stain   Final    FEW WBC PRESENT, PREDOMINANTLY PMN NO ORGANISMS SEEN    Culture   Final    CULTURE REINCUBATED FOR BETTER GROWTH Performed at [facility], [place] [address], [place], [place] [address]    Report Status PENDING  Incomplete  Anaerobic culture     Status: None (Preliminary result)   Collection Time: 10/31/17 11:59 AM  Result Value Ref Range Status   Specimen Description WOUND RIGHT FOOT ULCERATION  Final   Special Requests PT TAKING ZOSYN  Final   Gram Stain   Final    FEW WBC PRESENT, PREDOMINANTLY PMN NO ORGANISMS SEEN Performed at [facility], [place] [address], [place], [place] [address]    Culture PENDING  Incomplete   Report Status PENDING  Incomplete       Radiology Studies: No results  found.    Scheduled Meds: . amLODipine  10 mg Oral QHS  . cinacalcet  30 mg Oral Q supper  . [START ON 11/05/2017] darbepoetin (ARANESP) injection - DIALYSIS  60 mcg Intravenous Q Fri-HD  . heparin  5,000 Units Subcutaneous Q8H  . insulin aspart  0-5 Units Subcutaneous QHS  . insulin aspart  0-9 Units Subcutaneous TID WC  . insulin aspart  8 Units Subcutaneous TID AC  . insulin glargine  25 Units Subcutaneous QHS  . losartan  50 mg Oral QHS  . metoCLOPramide (REGLAN) injection  5 mg Intravenous Q8H  . multivitamin  1 tablet Oral QHS  . pantoprazole  40 mg Oral Daily  . sevelamer carbonate  3,200 mg Oral TID WC   Continuous Infusions: . piperacillin-tazobactam (ZOSYN)  IV 3.375 g (11/02/17 0804)  . vancomycin Stopped (11/01/17 2011)     LOS: 8 days    Time spent: 30 minutes   Dessa Phi, DO [facility] [email or web address] Password [email or web address] 11/02/2017, 10:22 AM

## 2017-11-02 NOTE — Progress Notes (Signed)
During time when RN was administering D50 due to a low blood sugar, the patient became tearful and stated "Can you drive me to [place] so that I can kill my baby momma so I can take care of my baby girl?"  His RN to notify MD.

## 2017-11-02 NOTE — Progress Notes (Signed)
Subjective:  Patient ID: [name], male    DOB: Nov 05, 1980,  MRN: [id number]  Patient seen at bedside. Wants to go home - states had I not come to see him and get him to agree to stay tonight he would have left AMA later tonight. Patient reports significant home issues that he states he needs to get addressed.  Objective:   Vitals:   11/02/17 0522 11/02/17 0935  BP: (!) 167/87 (!) 164/89  Pulse: 91 94  Resp: 18 18  Temp: 98.6 F (37 C) 98.2 F (36.8 C)  SpO2: 97% 100%   General AA&O x3. Normal mood and affect.  Vascular Dorsalis pedis and posterior tibial pulses 2/4 bilat. Brisk capillary refill to all digits. Pedal hair present.  Neurologic Epicritic sensation grossly absent.  Dermatologic 1st interspace appears without purulence. Erythema appears resolved. No fluctuance or crepitus. No exposed bone.  5th metatarsal ulcer with granular base.  Orthopedic: No pain to palpation.   Results for orders placed or performed during the hospital encounter of 10/25/17 (from the past 24 hour(s))  Glucose, capillary     Status: Abnormal   Collection Time: 11/01/17  8:11 PM  Result Value Ref Range   Glucose-Capillary 216 (H) 65 - 99 mg/dL  Glucose, capillary     Status: Abnormal   Collection Time: 11/01/17  9:43 PM  Result Value Ref Range   Glucose-Capillary 206 (H) 65 - 99 mg/dL   Comment 1 Notify RN    Comment 2 Document in Chart   CBC     Status: Abnormal   Collection Time: 11/02/17  5:44 AM  Result Value Ref Range   WBC 13.0 (H) 4.0 - 10.5 K/uL   RBC 2.97 (L) 4.22 - 5.81 MIL/uL   Hemoglobin 9.0 (L) 13.0 - 17.0 g/dL   HCT 29.2 (L) 39.0 - 52.0 %   MCV 98.3 78.0 - 100.0 fL   MCH 30.3 26.0 - 34.0 pg   MCHC 30.8 30.0 - 36.0 g/dL   RDW 14.7 11.5 - 15.5 %   Platelets 395 150 - 400 K/uL  Renal function panel     Status: Abnormal   Collection Time: 11/02/17  5:44 AM  Result Value Ref Range   Sodium 135 135 - 145 mmol/L   Potassium 5.2 (H) 3.5 - 5.1 mmol/L   Chloride 94 (L)  101 - 111 mmol/L   CO2 26 22 - 32 mmol/L   Glucose, Bld 239 (H) 65 - 99 mg/dL   BUN 36 (H) 6 - 20 mg/dL   Creatinine, Ser 7.02 (H) 0.61 - 1.24 mg/dL   Calcium 9.0 8.9 - 10.3 mg/dL   Phosphorus 5.3 (H) 2.5 - 4.6 mg/dL   Albumin 2.6 (L) 3.5 - 5.0 g/dL   GFR calc non Af Amer 9 (L) >60 mL/min   GFR calc Af Amer 10 (L) >60 mL/min   Anion gap 15 5 - 15  Glucose, capillary     Status: Abnormal   Collection Time: 11/02/17  5:57 AM  Result Value Ref Range   Glucose-Capillary 209 (H) 65 - 99 mg/dL   Comment 1 Notify RN    Comment 2 Document in Chart   Glucose, capillary     Status: Abnormal   Collection Time: 11/02/17 10:45 AM  Result Value Ref Range   Glucose-Capillary 43 (LL) 65 - 99 mg/dL   Comment 1 Notify RN    Comment 2 Document in Chart   Glucose, capillary     Status: None  Collection Time: 11/02/17 11:09 AM  Result Value Ref Range   Glucose-Capillary 69 65 - 99 mg/dL   Comment 1 Notify RN    Comment 2 Document in Chart   Glucose, capillary     Status: Abnormal   Collection Time: 11/02/17 12:11 PM  Result Value Ref Range   Glucose-Capillary 120 (H) 65 - 99 mg/dL  Glucose, capillary     Status: Abnormal   Collection Time: 11/02/17  2:50 PM  Result Value Ref Range   Glucose-Capillary 325 (H) 65 - 99 mg/dL   Comment 1 Notify RN    Comment 2 Document in Chart   Glucose, capillary     Status: Abnormal   Collection Time: 11/02/17  4:29 PM  Result Value Ref Range   Glucose-Capillary 372 (H) 65 - 99 mg/dL   Assessment & Plan:  Patient was evaluated and treated and all questions answered.  R Foot 1st Interspace Infection -Dressings removed. Erythema appears resolved. -WBC still slightly elevated. Afebrile, vitals normalized. -Cultures reviewed. Staphylococcus aureus. C/s pending. -Recommend HHC at discharge - aquacell packing qOD to the 1st interspace. Medihoney to the lateral 5th metatarsal wound, dry sterile dressing. -Will arrange f/u for patient this Thursday in the  office.  Dispo: Recommend discharge tomorrow AM assuming leukocytosis is resolved/stabilized and culture and sensitivities resulted. Patient would likely benefit from Vancomycin to be dosed at dialysis for total 3 weeks of total therapy.  Total floor time: 25 mins  Please contact with questions or concerns.  Evelina Bucy, DPM

## 2017-11-02 NOTE — Progress Notes (Signed)
Back into room to discuss with patient his threat to "kill his baby momma."  Pt's response was "are you sure you are in the right room?"  He did not recall making any threat.  In room at present, pleasant, asking for chips.  MD aware.  She will d/c his privileges to go off of unit.

## 2017-11-02 NOTE — Progress Notes (Signed)
BG 43, he was jittery, Drowsy, unable to answer orientation questions and talking out of his head.  Nurse treated pt. With Juice, crackers and a sandwich.His BG came up to 69. He continued to talk out of his head, stating he wanted me to drive him to his baby mothers house so he can kill her because she want let him see his daughter. MD notify, she ordered Dextrose 50% solution 25 ml.   Nurse spent about Hour and half with Patient. Patient is now Back to baseline. He is Alert and Oriented X 4. Will continue to monitor.

## 2017-11-03 ENCOUNTER — Ambulatory Visit: Payer: Medicare Other | Admitting: Podiatry

## 2017-11-03 DIAGNOSIS — Z992 Dependence on renal dialysis: Secondary | ICD-10-CM

## 2017-11-03 DIAGNOSIS — N186 End stage renal disease: Secondary | ICD-10-CM

## 2017-11-03 DIAGNOSIS — E11628 Type 2 diabetes mellitus with other skin complications: Secondary | ICD-10-CM

## 2017-11-03 DIAGNOSIS — L089 Local infection of the skin and subcutaneous tissue, unspecified: Secondary | ICD-10-CM

## 2017-11-03 DIAGNOSIS — R410 Disorientation, unspecified: Secondary | ICD-10-CM

## 2017-11-03 LAB — GLUCOSE, CAPILLARY
Glucose-Capillary: 185 mg/dL — ABNORMAL HIGH (ref 65–99)
Glucose-Capillary: 144 mg/dL — ABNORMAL HIGH (ref 65–99)
Glucose-Capillary: 47 mg/dL — ABNORMAL LOW (ref 65–99)
Glucose-Capillary: 116 mg/dL — ABNORMAL HIGH (ref 65–99)
Glucose-Capillary: 52 mg/dL — ABNORMAL LOW (ref 65–99)
Glucose-Capillary: 150 mg/dL — ABNORMAL HIGH (ref 65–99)
Glucose-Capillary: 57 mg/dL — ABNORMAL LOW (ref 65–99)
Glucose-Capillary: 292 mg/dL — ABNORMAL HIGH (ref 65–99)
Glucose-Capillary: 403 mg/dL — ABNORMAL HIGH (ref 65–99)

## 2017-11-03 LAB — RENAL FUNCTION PANEL
Albumin: 2.6 g/dL — ABNORMAL LOW (ref 3.5–5.0)
Anion gap: 18 — ABNORMAL HIGH (ref 5–15)
BUN: 52 mg/dL — ABNORMAL HIGH (ref 6–20)
CO2: 21 mmol/L — ABNORMAL LOW (ref 22–32)
Calcium: 9 mg/dL (ref 8.9–10.3)
Chloride: 94 mmol/L — ABNORMAL LOW (ref 101–111)
Creatinine, Ser: 8.46 mg/dL — ABNORMAL HIGH (ref 0.61–1.24)
GFR calc Af Amer: 8 mL/min — ABNORMAL LOW (ref >60)
GFR calc non Af Amer: 7 mL/min — ABNORMAL LOW (ref >60)
Glucose, Bld: 121 mg/dL — ABNORMAL HIGH (ref 65–99)
Phosphorus: 6.7 mg/dL — ABNORMAL HIGH (ref 2.5–4.6)
Potassium: 5.9 mmol/L — ABNORMAL HIGH (ref 3.5–5.1)
Sodium: 133 mmol/L — ABNORMAL LOW (ref 135–145)

## 2017-11-03 LAB — CBC
HCT: 27.2 % — ABNORMAL LOW (ref 39.0–52.0)
Hemoglobin: 8.9 g/dL — ABNORMAL LOW (ref 13.0–17.0)
MCH: 31.1 pg (ref 26.0–34.0)
MCHC: 32.7 g/dL (ref 30.0–36.0)
MCV: 95.1 fL (ref 78.0–100.0)
Platelets: 291 10*3/uL (ref 150–400)
RBC: 2.86 MIL/uL — ABNORMAL LOW (ref 4.22–5.81)
RDW: 15 % (ref 11.5–15.5)
WBC: 12.3 10*3/uL — ABNORMAL HIGH (ref 4.0–10.5)

## 2017-11-03 MED ORDER — OXYCODONE-ACETAMINOPHEN 5-325 MG PO TABS: 1.0000 | ORAL_TABLET | Freq: Four times a day (QID) | ORAL | 0 refills | Status: DC | PRN

## 2017-11-03 MED ORDER — ACETAMINOPHEN 325 MG PO TABS: 650.0000 mg | ORAL_TABLET | Freq: Four times a day (QID) | ORAL | Status: DC | PRN

## 2017-11-03 MED ORDER — CEFAZOLIN IV (FOR PTA / DISCHARGE USE ONLY): 2.0000 g | INTRAVENOUS | 0 refills | Status: DC

## 2017-11-03 MED ORDER — HEPARIN SODIUM (PORCINE) 1000 UNIT/ML DIALYSIS
9000.0000 [IU] | Freq: Once | INTRAMUSCULAR | Status: AC
  Administered 2017-11-03: 9000 [IU] via INTRAVENOUS_CENTRAL

## 2017-11-03 MED ORDER — VANCOMYCIN HCL IN DEXTROSE 750-5 MG/150ML-% IV SOLN
INTRAVENOUS | Status: AC
  Filled 2017-11-03: qty 150

## 2017-11-03 MED ORDER — DEXTROSE 50 % IV SOLN
INTRAVENOUS | Status: AC
  Filled 2017-11-03: qty 50

## 2017-11-03 MED ORDER — INSULIN GLARGINE 100 UNIT/ML ~~LOC~~ SOLN: 20.0000 [IU] | Freq: Every day | SUBCUTANEOUS | Status: DC

## 2017-11-03 MED ORDER — DEXTROSE 50 % IV SOLN
25.0000 g | Freq: Once | INTRAVENOUS | Status: AC
  Administered 2017-11-03: 25 g via INTRAVENOUS

## 2017-11-03 NOTE — Care Management Note (Signed)
Case Management Note  Patient Details  Name: [name] MRN: [id number] Date of Birth: 1980/12/13  Subjective/Objective:                    Action/Plan: CM notified Drew with [facility] that patient is d/cing home today. Pt has appointment with Podiatrist tomorrow so wont need [facility] RN until Friday. Drew aware.  Pt states he has transportation home.   Expected Discharge Date:  11/03/17               Expected Discharge Plan:  Home/Self Care  In-House Referral:     Discharge planning Services  CM Consult  Post Acute Care Choice:  Home Health Choice offered to:  Patient  DME Arranged:    DME Agency:     HH Arranged:  [place] Agency:  [place]  Status of Service:  Completed, signed off  If discussed at [place] of Stay Meetings, dates discussed:    Additional Comments:  Pollie Friar, RN 11/03/2017, 3:03 PM

## 2017-11-03 NOTE — Progress Notes (Signed)
Pharmacy Antibiotic Note  [name] is a 37 y.o. male admitted on 10/25/2017 with cellulitis.  Deep culture from second I&D is growing Staph aureus (preliminary).  Pharmacy has been consulted for vancomycin dosing.  Patient continues on MWF HD and tolerated today's session.  Afebrile and WBC down to 12.3.   Plan: Vanc 750mg  IV q-HD MWF Monitor HD tolerance, pre-HD level with next HD if not yet discharged   Height: 5\' 6"  (167.6 cm) Weight: 166 lb 7.2 oz (75.5 kg) IBW/kg (Calculated) : 63.8  Temp (24hrs), Avg:98.3 F (36.8 C), Min:97.9 F (36.6 C), Max:98.9 F (37.2 C)  Recent Labs  Lab 10/30/17 0550 10/31/17 0235 11/01/17 0435 11/02/17 0544 11/03/17 0210  WBC 13.9* 15.1* 13.7* 13.0* 12.3*  CREATININE 6.11* 7.95*  7.93* 9.55* 7.02* 8.46*    Estimated Creatinine Clearance: 10.7 mL/min (A) (by C-G formula based on SCr of 8.46 mg/dL (H)).    Allergies  Allergen Reactions  . No Known Allergies      Zosyn 1/28 >> 2/6 Vanc 1/28 >> * Doses: 1500 mg (1/28 at HD center PTA), 1000 mg (1/28 in [facility]), dose held 1/30 d/t extra dose on admit * dose charted twice on 2/1  1/28 BCx: negative 1/29 MRSA PCR: invalid 1/29 GI panel: negative 1/31 MRSA PCR: negative 2/3 tissue foot ulceration (deep cx) -  Staph aureus (preliminary) 2/3 wound, foot ulceration -   Maressa Apollo D. Mina Marble, PharmD, BCPS Pager:  [phone] 11/03/2017, 1:28 PM

## 2017-11-03 NOTE — Discharge Instructions (Signed)

## 2017-11-03 NOTE — Discharge Summary (Signed)
Physician Discharge Summary  [name] LNL:[id number] DOB: 05/30/1981 DOA: 10/25/2017  PCP: Patient, No Pcp Per  Admit date: 10/25/2017 Discharge date: 11/03/2017  Time spent: 35 minutes  Recommendations for Outpatient Follow-up:  Podiatry Dr.Michael Price on 2/7 Continue IV Ancef 2gm every HD MWF for 3weeks, stop date 2/27 for MSSA diabetic foot infection Home health RN for wound care  Discharge Diagnoses:    Sepsis   Abscess or cellulitis of toe, right   Cellulitis of right lower extremity   Right foot infection   Diabetic foot infection ([place])   Diabetes mellitus type 1 ([place])   ESRD (end stage renal disease) on dialysis ([place])   Essential (primary) hypertension   Sepsis ([place])   Diabetic foot infection ([place])   Osteomyelitis of ankle or foot, acute, right ([place])   Ulcerated, foot, right, limited to breakdown of skin ([place])   Delirium   Discharge Condition: Stable  Diet recommendation: Renal/diabetic  Filed Weights   11/01/17 1500 11/01/17 1900 11/03/17 0830  Weight: 81.4 kg (179 lb 7.3 oz) 76 kg (167 lb 8.8 oz) 81.7 kg (180 lb 1.9 oz)    History of present illness:  [name] is a 37 y.o.malewith medical history significant of DM type I, ESRD on HD, and diabetic foot ulcer followed by Dr. March Rummage; who presents with complaints of right foot swelling. During hemodialysis, it was noticed that he had swelling and reddiscoloration of his right foot. He states that he has no feeling in his feet,and did not noticed the symptoms until they pointed out.He does not recall any recent trauma or injury to his foot. Patient has a known ulceration of the lateral aspect of the right foot that he states has been there for years.He had just recently reestablished care and have been being followed by Dr. March Rummage of podiatry. He was made a urgent appointmentat his podiatrist's office and was seen by Dr. Daylene Katayama. X-rays were obtained which showed no signs of osseous involvement, but  hewas sent to the emergency department for need of IV antibiotics.   Hospital Course:  Sepsis 2/2 diabetic foot ulcer, abscess and early osteomyelitis ofright small toe -On presentation, tachycardic with fever up to 104.1 F, WBC 25.2, and lactic acid 2.03 -Treated with empiric antibiotics of vancomycin and Zosyn -ABI unable to obtain due to non-compressible arteries likely due to calcification  -Consulted Dr. March Rummage, underwent bedside I&D right foot abscess  -MRI revealed 1. Osteomyelitis of the proximal phalanx of the small toe, and surrounding cellulitis. 2. Ulceration along the plantar lateral side of the base of the great toe with extensive focal cellulitis tracking in the great toe,and also some cellulitis tracking along the ball of the foot.Possible gas in the soft tissues along the lateral base of the great toe. -S/p irrigation and debridement by Dr. March Rummage 2/3 -Deep wound Cx with MSSA -Dr. March Rummage recommended IV antibiotics with hemodialysis for 3 weeks, since he grew MSSA and not MRSA I changed his vancomycin to IV Ancef at a dose of 2 g with each hemodialysis as recommended by Pharm.D. -Renal M.D. notified regarding discharge plan for 3 weeks of antibiotics at discharge -Home health RN for every other day packing changes, aquacell packing to the 1st interspace. CM consulted   ESRD on HD -Nephrology following and completed HD today prior to discharge  Concern for STD -HIV NR  -GC/Chlamydia negative  -Discussed symptoms to look out for.   Diabetes mellitus type1 -Ha1c 6.2  -Continue Lantus/NovoLog and sliding  scale insulin  Essential hypertension -Continue amlodipine, losartan  Anxiety -Continue Klonopintwice a dayprn  Intractable nausea -Improved   Diarrhea -After receiving kayexalate. GI PCR negative. Resolved  Hyperkalemia -will be corrected with HD today  Consultants:   Podiatry  Renal  Procedures:   Bedside I&D right foot abscess 1/30 Dr.  March Rummage   Incision and drainage R 1st interspace, excisional debridement lateral foot wound 2/3 Dr. March Rummage        Discharge Exam: Vitals:   11/03/17 1128 11/03/17 1158  BP: (!) 159/67 140/74  Pulse: (!) 102 93  Resp: 16 17  Temp:    SpO2:      General: AAOx3 Cardiovascular: S1S2/RRR Respiratory: CTAB  Discharge Instructions    Allergies as of 11/03/2017      Reactions   No Known Allergies     Med Rec must be completed prior to using this [place]    Allergies  Allergen Reactions  . No Known Allergies    Follow-up Information    Evelina Bucy, DPM. Go on 11/04/2017.   Specialty:  Podiatry Contact information: [address] [address] [place], [place] Follow up.   Specialty:  Home Health Services Why:  They will contact you for the first visit. Contact information: [place] [address] [phone]            The results of significant diagnostics from this hospitalization (including imaging, microbiology, ancillary and laboratory) are listed below for reference.    Significant Diagnostic Studies: Dg Chest 2 View  Result Date: 10/25/2017 CLINICAL DATA:  Fever, diabetic wound for 1 day. EXAM: CHEST  2 VIEW COMPARISON:  07/27/2014. FINDINGS: Cardiomegaly. No consolidation or edema. No effusion or pneumothorax. Bones unremarkable. Compared with priors, dialysis catheter has been removed. IMPRESSION: Cardiomegaly.  No active disease. Electronically Signed   By: Staci Righter M.D.   On: 10/25/2017 19:33   Mr Foot Right Wo Contrast  Result Date: 10/29/2017 CLINICAL DATA:  Foot swelling, diabetes. Erythema and discoloration. Cellulitis. EXAM: MRI OF THE RIGHT FOREFOOT WITHOUT CONTRAST TECHNIQUE: Multiplanar, multisequence MR imaging of the right forefoot was performed. No intravenous contrast was administered. COMPARISON:  Radiographs from 10/26/2017 FINDINGS: Bones/Joint/Cartilage  Abnormal marrow edema signal in the proximal phalanx small toe compatible with active osteomyelitis. Ligaments Lisfranc ligament intact. Muscles and Tendons Low-level edema tracking along the plantar musculature of the foot favors neurogenic edema over myositis. Soft tissues Ulceration along the plantar lateral margin of the fifth MTP joint. Surrounding cellulitis noted. Ulceration along the plantar lateral side of the base of the great toe with extensive regional cellulitis which also tracks along the ball of the foot. Difficult to exclude gas in the soft tissues along the lateral base of the great toe. Localized fluid without an overt drainable abscess. IMPRESSION: 1. 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 2. Ulceration along the plantar lateral side of the base of the great toe with extensive focal cellulitis tracking in the great toe, and also some cellulitis tracking along the ball of the foot. Possible gas in the soft tissues along the lateral base of the great toe. No drainable abscess seen. Electronically Signed   By: Van Clines M.D.   On: 10/29/2017 08:38   Dg Foot Complete Right  Result Date: 10/27/2017 CLINICAL DATA:  Right foot infection. Pt. Says sores are on lateral 5th metatarsal and under heel. Hx of diabetes mellitus. EXAM: RIGHT FOOT COMPLETE - 3+ VIEW COMPARISON:  None. FINDINGS: Soft tissue lucency/irregularity lateral to the fifth MTP joint, compatible with the given history of sore. Adjacent osseous structures appear intact and normal in mineralization. No destructive changes to suggest osteomyelitis. No fracture line or displaced fracture fragment seen. Vascular calcifications seen throughout the foot. IMPRESSION: 1. Soft tissue lucency lateral to the fifth MTP joint, consistent with the given history of soft tissue infection/sore. 2. Adjacent osseous  structures appear normal. No evidence of osteomyelitis. Electronically Signed   By: Franki Cabot M.D.   On: 10/27/2017 00:37   Dg Foot Complete Right  Result Date: 10/25/2017 Please see detailed radiograph report in office note.  Vas [place] Burnard Bunting With/wo Tbi  Result Date: 10/28/2017 LOWER EXTREMITY DOPPLER STUDY Indications: Ulceration. Examination Guidelines: A complete evaluation includes B-mode imaging, spectral doppler, color doppler, and power doppler as needed of all accessible portions of each vessel. Bilateral testing is considered an integral part of a complete examination. Limited examinations for reoccurring indications may be performed as noted.  ABI Findings: +---------+------------------+-----+---------+--------+ Right    Rt Pressure (mmHg)IndexWaveform Comment  +---------+------------------+-----+---------+--------+ Brachial                                 AVF      +---------+------------------+-----+---------+--------+ PTA      254               1.52 triphasic         +---------+------------------+-----+---------+--------+ DP       254               1.52 triphasic         +---------+------------------+-----+---------+--------+ Great Toe99                0.59                   +---------+------------------+-----+---------+--------+ +---------+------------------+-----+---------+-------+ Left     Lt Pressure (mmHg)IndexWaveform Comment +---------+------------------+-----+---------+-------+ Brachial 167                    triphasic        +---------+------------------+-----+---------+-------+ PTA      254               1.52 triphasic        +---------+------------------+-----+---------+-------+ DP       254               1.52 triphasic        +---------+------------------+-----+---------+-------+ Donalee Citrin               0.69                  +---------+------------------+-----+---------+-------+  Final Interpretation: Right: Resting right  ankle-brachial index indicates noncompressible right lower extremity arteries.The right toe-brachial index is abnormal. Unable to obtain ABI due to noncompressible arteries likely due to medial calcification. Left: Resting left ankle-brachial index indicates noncompressible left lower extremity arteries.The left toe-brachial index is abnormal. Unable to obtain ABI due to noncompressible arteries likely due to medial calcification.  *See table(s) above for measurements and observations.  Electronically signed by Harold Barban on 10/28/2017 at 7:38:17 AM.  Microbiology: Recent Results (from the past 240 hour(s))  Culture, blood (Routine x 2)     Status: None   Collection Time: 10/25/17  6:00 PM  Result Value Ref Range Status   Specimen Description BLOOD BLOOD LEFT FOREARM  Final   Special Requests   Final    BOTTLES DRAWN AEROBIC AND ANAEROBIC Blood Culture adequate volume   Culture   Final    NO GROWTH 5 DAYS Performed at [facility], [address], [place], [place] [address]    Report Status 10/30/2017 FINAL  Final  Culture, blood (Routine x 2)     Status: None   Collection Time: 10/25/17  7:45 PM  Result Value Ref Range Status   Specimen Description BLOOD LEFT HAND  Final   Special Requests IN PEDIATRIC BOTTLE Blood Culture adequate volume  Final   Culture   Final    NO GROWTH 5 DAYS Performed at [facility], [place] [address] [place], [place] [address]    Report Status 10/30/2017 FINAL  Final  Gastrointestinal Panel by PCR , Stool     Status: None   Collection Time: 10/26/17  1:00 PM  Result Value Ref Range Status   Campylobacter species NOT DETECTED NOT DETECTED Final   Plesimonas shigelloides NOT DETECTED NOT DETECTED Final   Salmonella species NOT DETECTED NOT DETECTED Final   Yersinia enterocolitica NOT DETECTED NOT DETECTED Final   Vibrio species NOT DETECTED NOT DETECTED Final   Vibrio cholerae NOT DETECTED NOT DETECTED Final   Enteroaggregative E coli (EAEC) NOT  DETECTED NOT DETECTED Final   Enteropathogenic E coli (EPEC) NOT DETECTED NOT DETECTED Final   Enterotoxigenic E coli (ETEC) NOT DETECTED NOT DETECTED Final   Shiga like toxin producing E coli (STEC) NOT DETECTED NOT DETECTED Final   Shigella/Enteroinvasive E coli (EIEC) NOT DETECTED NOT DETECTED Final   Cryptosporidium NOT DETECTED NOT DETECTED Final   Cyclospora cayetanensis NOT DETECTED NOT DETECTED Final   Entamoeba histolytica NOT DETECTED NOT DETECTED Final   Giardia lamblia NOT DETECTED NOT DETECTED Final   Adenovirus F40/41 NOT DETECTED NOT DETECTED Final   Astrovirus NOT DETECTED NOT DETECTED Final   Norovirus GI/GII NOT DETECTED NOT DETECTED Final   Rotavirus A NOT DETECTED NOT DETECTED Final   Sapovirus (I, II, IV, and V) NOT DETECTED NOT DETECTED Final    Comment: Performed at [facility], [address] [place], [place] [address]  MRSA PCR Screening     Status: Abnormal   Collection Time: 10/26/17  5:22 PM  Result Value Ref Range Status   MRSA by PCR INVALID RESULTS, SPECIMEN SENT FOR CULTURE (A) NEGATIVE Final    Comment:        The GeneXpert MRSA Assay (FDA approved for NASAL specimens only), is one component of a comprehensive MRSA colonization surveillance program. It is not intended to diagnose MRSA infection nor to guide or monitor treatment for MRSA infections. RESULT CALLED TO, READ BACK BY AND VERIFIED WITH: RN Nona Dell [phone]   MRSA culture     Status: Abnormal   Collection Time: 10/26/17  8:00 PM  Result Value Ref Range Status   Specimen Description NASAL SWAB  Final   Special Requests   Final    NONE Performed at [facility], [address] [place], [place] [address]    Culture STAPHYLOCOCCUS AUREUS (A)  Final   Report Status 10/30/2017 FINAL  Final   Organism ID, Bacteria STAPHYLOCOCCUS AUREUS  Final      Susceptibility   Staphylococcus aureus - MIC*    CIPROFLOXACIN >=8 RESISTANT Resistant  ERYTHROMYCIN <=0.25  SENSITIVE Sensitive     GENTAMICIN <=0.5 SENSITIVE Sensitive     OXACILLIN 0.5 SENSITIVE Sensitive     TETRACYCLINE <=1 SENSITIVE Sensitive     VANCOMYCIN 1 SENSITIVE Sensitive     TRIMETH/SULFA <=10 SENSITIVE Sensitive     CLINDAMYCIN <=0.25 SENSITIVE Sensitive     RIFAMPIN <=0.5 SENSITIVE Sensitive     Inducible Clindamycin NEGATIVE Sensitive     * STAPHYLOCOCCUS AUREUS  MRSA PCR Screening     Status: None   Collection Time: 10/28/17 10:25 AM  Result Value Ref Range Status   MRSA by PCR NEGATIVE NEGATIVE Final    Comment:        The GeneXpert MRSA Assay (FDA approved for NASAL specimens only), is one component of a comprehensive MRSA colonization surveillance program. It is not intended to diagnose MRSA infection nor to guide or monitor treatment for MRSA infections.   Aerobic/Anaerobic Culture (surgical/deep wound)     Status: None (Preliminary result)   Collection Time: 10/31/17 11:56 AM  Result Value Ref Range Status   Specimen Description TISSUE RIGHT FOOT ULCERATION  Final   Special Requests PT TAKING ZOSYIN  Final   Gram Stain   Final    FEW WBC PRESENT, PREDOMINANTLY PMN NO ORGANISMS SEEN Performed at [facility], [place] [address], [place], [place] [address]    Culture   Final    RARE STAPHYLOCOCCUS AUREUS SUSCEPTIBILITIES TO FOLLOW NO ANAEROBES ISOLATED; CULTURE IN PROGRESS FOR 5 DAYS    Report Status PENDING  Incomplete  Anaerobic culture     Status: None (Preliminary result)   Collection Time: 10/31/17 11:59 AM  Result Value Ref Range Status   Specimen Description WOUND RIGHT FOOT ULCERATION  Final   Special Requests PT TAKING ZOSYN  Final   Gram Stain   Final    FEW WBC PRESENT, PREDOMINANTLY PMN NO ORGANISMS SEEN Performed at [facility], [address], [place], [place] [address]    Culture   Final    NO ANAEROBES ISOLATED; CULTURE IN PROGRESS FOR 5 DAYS   Report Status PENDING  Incomplete     Labs: Basic Metabolic Panel: Recent  Labs  Lab 10/28/17 0221  10/30/17 0550 10/31/17 0235 11/01/17 0435 11/02/17 0544 11/03/17 0210  NA 129*   < > 132* 130*  130* 135 135 133*  K 4.5   < > 4.3 4.8  4.8 5.2* 5.2* 5.9*  CL 93*   < > 92* 93*  92* 96* 94* 94*  CO2 20*   < > 27 23  24 25 26  21*  GLUCOSE 416*   < > 314* 262*  261* 125* 239* 121*  BUN 22*   < > 23* 39*  40* 55* 36* 52*  CREATININE 5.94*   < > 6.11* 7.95*  7.93* 9.55* 7.02* 8.46*  CALCIUM 9.0   < > 8.7* 8.9  8.9 8.8* 9.0 9.0  PHOS 3.0  --   --  4.1 5.9* 5.3* 6.7*   < > = values in this interval not displayed.   Liver Function Tests: Recent Labs  Lab 10/28/17 0221 10/31/17 0235 11/01/17 0435 11/02/17 0544 11/03/17 0210  ALBUMIN 3.0* 2.7* 2.7* 2.6* 2.6*   No results for input(s): LIPASE, AMYLASE in the last 168 hours. No results for input(s): AMMONIA in the last 168 hours. CBC: Recent Labs  Lab 10/30/17 0550 10/31/17 0235 11/01/17 0435 11/02/17 0544 11/03/17 0210  WBC 13.9* 15.1* 13.7* 13.0* 12.3*  HGB  10.0* 9.3* 9.1* 9.0* 8.9*  HCT 30.8* 28.5* 28.7* 29.2* 27.2*  MCV 95.1 95.3 96.6 98.3 95.1  PLT 278 302 360 395 291   Cardiac Enzymes: No results for input(s): CKTOTAL, CKMB, CKMBINDEX, TROPONINI in the last 168 hours. BNP: BNP (last 3 results) No results for input(s): BNP in the last 8760 hours.  ProBNP (last 3 results) No results for input(s): PROBNP in the last 8760 hours.  CBG: Recent Labs  Lab 11/02/17 2107 11/03/17 0544 11/03/17 1004 11/03/17 1017 11/03/17 1127  GLUCAP 233* 185* 47* 144* 116*       Signed:  Domenic Polite MD.  [facility] 11/03/2017, 12:27 PM

## 2017-11-03 NOTE — Progress Notes (Signed)
[facility] Progress Note   Subjective:  Doesn't remember yesterday's conversation.  No new c/o.   Objective Vitals:   11/03/17 0930 11/03/17 1000 11/03/17 1030 11/03/17 1128  BP: (!) 159/69 (!) 176/80 (!) 170/93 (!) 159/67  Pulse: 91 97 (!) 104 (!) 102  Resp: 16 16 18 16   Temp:      TempSrc:      SpO2:      Weight:      Height:       Filed Weights   11/01/17 1500 11/01/17 1900 11/03/17 0830  Weight: 81.4 kg (179 lb 7.3 oz) 76 kg (167 lb 8.8 oz) 81.7 kg (180 lb 1.9 oz)   Physical Exam WDWN AAM. NAD Many tattoos No distress, more lucid today Regular S1S2 No S3 Abdomen soft, no tenderness.  No LE edema Wearing Darco shoe on R foot. Wrapped.  LLE fine. Dialysis Access: RU AVG   Recent Labs  Lab 11/01/17 0435 11/02/17 0544 11/03/17 0210  NA 135 135 133*  K 5.2* 5.2* 5.9*  CL 96* 94* 94*  CO2 25 26 21*  GLUCOSE 125* 239* 121*  BUN 55* 36* 52*  CREATININE 9.55* 7.02* 8.46*  CALCIUM 8.8* 9.0 9.0  PHOS 5.9* 5.3* 6.7*    Recent Labs  Lab 11/01/17 0435 11/02/17 0544 11/03/17 0210  ALBUMIN 2.7* 2.6* 2.6*    Recent Labs  Lab 10/30/17 0550 10/31/17 0235 11/01/17 0435 11/02/17 0544 11/03/17 0210  WBC 13.9* 15.1* 13.7* 13.0* 12.3*  HGB 10.0* 9.3* 9.1* 9.0* 8.9*  HCT 30.8* 28.5* 28.7* 29.2* 27.2*  MCV 95.1 95.3 96.6 98.3 95.1  PLT 278 302 360 395 291       Component Value Date/Time   SDES WOUND RIGHT FOOT ULCERATION 10/31/2017 1159   SPECREQUEST PT TAKING ZOSYN 10/31/2017 1159   CULT  10/31/2017 1159    NO ANAEROBES ISOLATED; CULTURE IN PROGRESS FOR 5 DAYS   REPTSTATUS PENDING 10/31/2017 1159    Recent Labs  Lab 11/02/17 2107 11/03/17 0544 11/03/17 1004 11/03/17 1017 11/03/17 1127  GLUCAP 233* 185* 47* 144* 116*   Studies/Results: No results found.  Medications: . vancomycin 750 mg (11/03/17 1130)   . amLODipine  10 mg Oral QHS  . cinacalcet  30 mg Oral Q supper  . [START ON 11/05/2017] darbepoetin (ARANESP) injection -  DIALYSIS  60 mcg Intravenous Q Fri-HD  . heparin  5,000 Units Subcutaneous Q8H  . insulin aspart  0-5 Units Subcutaneous QHS  . insulin aspart  0-9 Units Subcutaneous TID WC  . insulin aspart  5 Units Subcutaneous TID AC  . insulin glargine  20 Units Subcutaneous QHS  . losartan  50 mg Oral QHS  . multivitamin  1 tablet Oral QHS  . pantoprazole  40 mg Oral Daily  . sevelamer carbonate  3,200 mg Oral TID WC    Dialysis Orders: MWF [facility] 4h   75.5kg  (will have lower EDW at discharge)  400/800  2/2.25 bath  LUA AVG  Hep 9000 mircera 30mcg IV q2wks - last 1/22 Calcitriol 1.76mcg PO qHD   Assessment: 1  R foot osteo sp I&D 2/3 2  ESRD HD mwf, ^K+ 3  HTN bp's up 4  DM on insulin 5  Anemia ckd, started darbe 60 /wk on 2/1 6  Severe gastroparesis & diab diarrhea 7  Vol stable, up 6kg   PLan - HD today, max UF    Kelly Splinter MD [employer] pgr [phone]   11/03/2017,  11:54 AM

## 2017-11-03 NOTE — Progress Notes (Signed)
Pt educated and able to teach back discharge instructions. Understands follow up and home care instructions. No new concerns. IV removed.   Pt taken in wheelchair off unit for discharge. Uncle transported home.

## 2017-11-03 NOTE — Progress Notes (Signed)
1330 pt returned from HD

## 2017-11-04 ENCOUNTER — Encounter: Payer: Self-pay | Admitting: Podiatry

## 2017-11-04 ENCOUNTER — Ambulatory Visit (INDEPENDENT_AMBULATORY_CARE_PROVIDER_SITE_OTHER): Payer: Medicare Other | Admitting: Podiatry

## 2017-11-04 ENCOUNTER — Telehealth: Payer: Self-pay | Admitting: *Deleted

## 2017-11-04 DIAGNOSIS — M86671 Other chronic osteomyelitis, right ankle and foot: Secondary | ICD-10-CM

## 2017-11-04 DIAGNOSIS — L97511 Non-pressure chronic ulcer of other part of right foot limited to breakdown of skin: Secondary | ICD-10-CM

## 2017-11-04 DIAGNOSIS — L97512 Non-pressure chronic ulcer of other part of right foot with fat layer exposed: Secondary | ICD-10-CM

## 2017-11-04 MED ORDER — CEFAZOLIN IV (FOR PTA / DISCHARGE USE ONLY): 2.0000 g | INTRAVENOUS | 0 refills | Status: DC

## 2017-11-04 NOTE — Telephone Encounter (Addendum)
I spoke with [facility] [address] and informed I would be faxing Ancef orders for pt. Faxed orders to [facility] Attn: Melissa.

## 2017-11-04 NOTE — Progress Notes (Signed)
RN rechecked cbg before administering insulin per patient request. Result 403. RN gave insulin and attempted to recheck cbg and notify MD. Patient refused testing, stating that he didn't want to delay his discharge. RN attempted to notify MD, patient asserted multiple times that he didn't want RN to notify MD " because he wouldn't be able to leave" and stated that he would recheck his blood sugar when he got home. RN educated on risks of high blood sugar and need to notify MD. Patient verbalized understanding and able to teach back. Patient continued to state that he did not want the RN to call the doctor unless it had to do with his discharge. Alert and oriented x 4

## 2017-11-04 NOTE — Progress Notes (Signed)
  Subjective:  Patient ID: [name], male    DOB: 06/09/1981,  MRN: [id number]  No chief complaint on file.  37 y.o. male returns for f/u. Patient was discharged from the hospital yesterday. HHC to start tomorrow. Patient very tearful today about his care.  Objective:  There were no vitals filed for this visit. General AA&O x3. Normal mood and affect.  Vascular Foot warm to touch.  Neurologic Sensation grossly diminished.  Dermatologic (Wound) Right 1st interspace with exposed fat. No exposed bone. No purulence. No warmth. No erythema. No ascending celllulitis. No signs of acute infection.  Lateral 5th ulceration with granular wound base. No warmth. No erythema. No signs of acute infection. No exposed bone.  Orthopedic: No pain to palpation either foot.   Assessment & Plan:  Patient was evaluated and treated and all questions answered.  R 1st Interspace Infection, s/p I&D -Dressings removed today. No purulence. No erythema. No signs of acute infection. Redressed with medihoney and DSD. -Patient to receive IV ancef at HD for total of 3 weeks of therapy. Orders sent over to confirm by patient request. -Patient to receive HHC qOD. Aquacell packing to be applied to the 1st interspace. -Patient very upset today during today's visit. Reports he is most upset that he did not receive the dressing supplies ordered on 10/01/17. He believes that had he gotten those supplies his foot would not be in its current condition. I showed patient our faxed record of the supplies sent to [facility]. There are no records of follow-up faxes or phone calls from [facility] regarding these orders. Patient is now receiving [facility] as he is post-operative; patient confirmed they are coming tomorrow for evaluation. -Discussed with patient that I do not believe the issue at his 1st interspace is related to the ulcer at the outside of his foot. To date the lateral ulcer has never appeared clinically infected. MRI did show signs of  osteomyelitis but this does not appear acute. MRI did not suggest tracking to the 1st interspace. I again discussed that I believe he may have stepped on something which caused the infection in his great toe and 1st interspace. Photo dated 10/25/16 shows some concern of a possible puncture site, again seen and noted on the 30th during consultation.  R Ulcer 5th MPJ  -Healing well without signs of acute infection. No exposed bone. -No debridement today. -Dressed with medihoney, DSD.   25 minutes of face to face time were spent with the patient. >50% of this was spent on counseling and coordination of care. Specifically discussed with patient the above diagnoses and treatment plans.   Return in about 1 week (around 11/11/2017) for Wound Care.

## 2017-11-04 NOTE — Telephone Encounter (Signed)
I spoke with [place] she gave me the [facility] facility contact phone [phone] and fax [phone].

## 2017-11-05 ENCOUNTER — Telehealth: Payer: Self-pay | Admitting: *Deleted

## 2017-11-05 ENCOUNTER — Telehealth: Payer: Self-pay | Admitting: Podiatry

## 2017-11-05 DIAGNOSIS — N2581 Secondary hyperparathyroidism of renal origin: Secondary | ICD-10-CM | POA: Diagnosis not present

## 2017-11-05 DIAGNOSIS — Z992 Dependence on renal dialysis: Secondary | ICD-10-CM | POA: Diagnosis not present

## 2017-11-05 DIAGNOSIS — Z79891 Long term (current) use of opiate analgesic: Secondary | ICD-10-CM | POA: Diagnosis not present

## 2017-11-05 DIAGNOSIS — M8618 Other acute osteomyelitis, other site: Secondary | ICD-10-CM | POA: Diagnosis not present

## 2017-11-05 DIAGNOSIS — E1043 Type 1 diabetes mellitus with diabetic autonomic (poly)neuropathy: Secondary | ICD-10-CM | POA: Diagnosis not present

## 2017-11-05 DIAGNOSIS — L97512 Non-pressure chronic ulcer of other part of right foot with fat layer exposed: Secondary | ICD-10-CM | POA: Diagnosis not present

## 2017-11-05 DIAGNOSIS — B957 Other staphylococcus as the cause of diseases classified elsewhere: Secondary | ICD-10-CM | POA: Diagnosis not present

## 2017-11-05 DIAGNOSIS — D631 Anemia in chronic kidney disease: Secondary | ICD-10-CM | POA: Diagnosis not present

## 2017-11-05 DIAGNOSIS — N186 End stage renal disease: Secondary | ICD-10-CM | POA: Diagnosis not present

## 2017-11-05 DIAGNOSIS — E10621 Type 1 diabetes mellitus with foot ulcer: Secondary | ICD-10-CM | POA: Diagnosis not present

## 2017-11-05 DIAGNOSIS — D509 Iron deficiency anemia, unspecified: Secondary | ICD-10-CM | POA: Diagnosis not present

## 2017-11-05 DIAGNOSIS — L97511 Non-pressure chronic ulcer of other part of right foot limited to breakdown of skin: Secondary | ICD-10-CM | POA: Diagnosis not present

## 2017-11-05 DIAGNOSIS — L03115 Cellulitis of right lower limb: Secondary | ICD-10-CM | POA: Diagnosis not present

## 2017-11-05 DIAGNOSIS — K3184 Gastroparesis: Secondary | ICD-10-CM | POA: Diagnosis not present

## 2017-11-05 DIAGNOSIS — Z794 Long term (current) use of insulin: Secondary | ICD-10-CM | POA: Diagnosis not present

## 2017-11-05 DIAGNOSIS — E1022 Type 1 diabetes mellitus with diabetic chronic kidney disease: Secondary | ICD-10-CM | POA: Diagnosis not present

## 2017-11-05 DIAGNOSIS — R739 Hyperglycemia, unspecified: Secondary | ICD-10-CM | POA: Diagnosis not present

## 2017-11-05 LAB — AEROBIC/ANAEROBIC CULTURE W GRAM STAIN (SURGICAL/DEEP WOUND)

## 2017-11-05 LAB — AEROBIC/ANAEROBIC CULTURE (SURGICAL/DEEP WOUND)

## 2017-11-05 NOTE — Telephone Encounter (Signed)
This is Development worker, international aid, Therapist, sports with [employer]. I just went to see [name] for home health care. I know Dr. March Rummage is going to be signing his home health orders but I needed to speak to someone about the frequency of daily. We were hoping we would have someone to teach him how do to wound care but there is nobody. His mom works a lot. I was trying to see if there was a way we could do it every other day or daily until his follow up appointment. If you could give me a call back at your convenience. My number is [phone]. Thanks so much. Bye.

## 2017-11-05 NOTE — Telephone Encounter (Signed)
Called and approved daily visits until he is next seen in the office on 2/13. Likely qOD thereafter as long as the wound appears stable.

## 2017-11-05 NOTE — Telephone Encounter (Signed)
[place] states she is instating pt in their agency from the hospital and asked if Dr. March Rummage who was seeing pt in the hospital would be the signing doctor. I told her he would.

## 2017-11-06 DIAGNOSIS — L03115 Cellulitis of right lower limb: Secondary | ICD-10-CM | POA: Diagnosis not present

## 2017-11-06 DIAGNOSIS — B957 Other staphylococcus as the cause of diseases classified elsewhere: Secondary | ICD-10-CM | POA: Diagnosis not present

## 2017-11-06 DIAGNOSIS — L97511 Non-pressure chronic ulcer of other part of right foot limited to breakdown of skin: Secondary | ICD-10-CM | POA: Diagnosis not present

## 2017-11-06 DIAGNOSIS — E1022 Type 1 diabetes mellitus with diabetic chronic kidney disease: Secondary | ICD-10-CM | POA: Diagnosis not present

## 2017-11-06 DIAGNOSIS — E10621 Type 1 diabetes mellitus with foot ulcer: Secondary | ICD-10-CM | POA: Diagnosis not present

## 2017-11-06 DIAGNOSIS — L97512 Non-pressure chronic ulcer of other part of right foot with fat layer exposed: Secondary | ICD-10-CM | POA: Diagnosis not present

## 2017-11-06 LAB — ANAEROBIC CULTURE

## 2017-11-07 DIAGNOSIS — L03115 Cellulitis of right lower limb: Secondary | ICD-10-CM | POA: Diagnosis not present

## 2017-11-07 DIAGNOSIS — B957 Other staphylococcus as the cause of diseases classified elsewhere: Secondary | ICD-10-CM | POA: Diagnosis not present

## 2017-11-07 DIAGNOSIS — L97512 Non-pressure chronic ulcer of other part of right foot with fat layer exposed: Secondary | ICD-10-CM | POA: Diagnosis not present

## 2017-11-07 DIAGNOSIS — L97511 Non-pressure chronic ulcer of other part of right foot limited to breakdown of skin: Secondary | ICD-10-CM | POA: Diagnosis not present

## 2017-11-07 DIAGNOSIS — E10621 Type 1 diabetes mellitus with foot ulcer: Secondary | ICD-10-CM | POA: Diagnosis not present

## 2017-11-07 DIAGNOSIS — E1022 Type 1 diabetes mellitus with diabetic chronic kidney disease: Secondary | ICD-10-CM | POA: Diagnosis not present

## 2017-11-08 DIAGNOSIS — B957 Other staphylococcus as the cause of diseases classified elsewhere: Secondary | ICD-10-CM | POA: Diagnosis not present

## 2017-11-08 DIAGNOSIS — L03115 Cellulitis of right lower limb: Secondary | ICD-10-CM | POA: Diagnosis not present

## 2017-11-08 DIAGNOSIS — E10621 Type 1 diabetes mellitus with foot ulcer: Secondary | ICD-10-CM | POA: Diagnosis not present

## 2017-11-08 DIAGNOSIS — R739 Hyperglycemia, unspecified: Secondary | ICD-10-CM | POA: Diagnosis not present

## 2017-11-08 DIAGNOSIS — D509 Iron deficiency anemia, unspecified: Secondary | ICD-10-CM | POA: Diagnosis not present

## 2017-11-08 DIAGNOSIS — N2581 Secondary hyperparathyroidism of renal origin: Secondary | ICD-10-CM | POA: Diagnosis not present

## 2017-11-08 DIAGNOSIS — M8618 Other acute osteomyelitis, other site: Secondary | ICD-10-CM | POA: Diagnosis not present

## 2017-11-08 DIAGNOSIS — L97512 Non-pressure chronic ulcer of other part of right foot with fat layer exposed: Secondary | ICD-10-CM | POA: Diagnosis not present

## 2017-11-08 DIAGNOSIS — N186 End stage renal disease: Secondary | ICD-10-CM | POA: Diagnosis not present

## 2017-11-08 DIAGNOSIS — E1022 Type 1 diabetes mellitus with diabetic chronic kidney disease: Secondary | ICD-10-CM | POA: Diagnosis not present

## 2017-11-08 DIAGNOSIS — L97511 Non-pressure chronic ulcer of other part of right foot limited to breakdown of skin: Secondary | ICD-10-CM | POA: Diagnosis not present

## 2017-11-09 DIAGNOSIS — L03115 Cellulitis of right lower limb: Secondary | ICD-10-CM | POA: Diagnosis not present

## 2017-11-09 DIAGNOSIS — E1022 Type 1 diabetes mellitus with diabetic chronic kidney disease: Secondary | ICD-10-CM | POA: Diagnosis not present

## 2017-11-09 DIAGNOSIS — E10621 Type 1 diabetes mellitus with foot ulcer: Secondary | ICD-10-CM | POA: Diagnosis not present

## 2017-11-09 DIAGNOSIS — B957 Other staphylococcus as the cause of diseases classified elsewhere: Secondary | ICD-10-CM | POA: Diagnosis not present

## 2017-11-09 DIAGNOSIS — L97511 Non-pressure chronic ulcer of other part of right foot limited to breakdown of skin: Secondary | ICD-10-CM | POA: Diagnosis not present

## 2017-11-09 DIAGNOSIS — L97512 Non-pressure chronic ulcer of other part of right foot with fat layer exposed: Secondary | ICD-10-CM | POA: Diagnosis not present

## 2017-11-10 DIAGNOSIS — N2581 Secondary hyperparathyroidism of renal origin: Secondary | ICD-10-CM | POA: Diagnosis not present

## 2017-11-10 DIAGNOSIS — E1022 Type 1 diabetes mellitus with diabetic chronic kidney disease: Secondary | ICD-10-CM | POA: Diagnosis not present

## 2017-11-10 DIAGNOSIS — L03115 Cellulitis of right lower limb: Secondary | ICD-10-CM | POA: Diagnosis not present

## 2017-11-10 DIAGNOSIS — B957 Other staphylococcus as the cause of diseases classified elsewhere: Secondary | ICD-10-CM | POA: Diagnosis not present

## 2017-11-10 DIAGNOSIS — E10621 Type 1 diabetes mellitus with foot ulcer: Secondary | ICD-10-CM | POA: Diagnosis not present

## 2017-11-10 DIAGNOSIS — L97511 Non-pressure chronic ulcer of other part of right foot limited to breakdown of skin: Secondary | ICD-10-CM | POA: Diagnosis not present

## 2017-11-10 DIAGNOSIS — R739 Hyperglycemia, unspecified: Secondary | ICD-10-CM | POA: Diagnosis not present

## 2017-11-10 DIAGNOSIS — L97512 Non-pressure chronic ulcer of other part of right foot with fat layer exposed: Secondary | ICD-10-CM | POA: Diagnosis not present

## 2017-11-10 DIAGNOSIS — N186 End stage renal disease: Secondary | ICD-10-CM | POA: Diagnosis not present

## 2017-11-10 DIAGNOSIS — D509 Iron deficiency anemia, unspecified: Secondary | ICD-10-CM | POA: Diagnosis not present

## 2017-11-10 DIAGNOSIS — M8618 Other acute osteomyelitis, other site: Secondary | ICD-10-CM | POA: Diagnosis not present

## 2017-11-11 ENCOUNTER — Ambulatory Visit: Payer: Medicare Other | Admitting: Podiatry

## 2017-11-12 DIAGNOSIS — E1022 Type 1 diabetes mellitus with diabetic chronic kidney disease: Secondary | ICD-10-CM | POA: Diagnosis not present

## 2017-11-12 DIAGNOSIS — N186 End stage renal disease: Secondary | ICD-10-CM | POA: Diagnosis not present

## 2017-11-12 DIAGNOSIS — D509 Iron deficiency anemia, unspecified: Secondary | ICD-10-CM | POA: Diagnosis not present

## 2017-11-12 DIAGNOSIS — M8618 Other acute osteomyelitis, other site: Secondary | ICD-10-CM | POA: Diagnosis not present

## 2017-11-12 DIAGNOSIS — E10621 Type 1 diabetes mellitus with foot ulcer: Secondary | ICD-10-CM | POA: Diagnosis not present

## 2017-11-12 DIAGNOSIS — R739 Hyperglycemia, unspecified: Secondary | ICD-10-CM | POA: Diagnosis not present

## 2017-11-12 DIAGNOSIS — L97511 Non-pressure chronic ulcer of other part of right foot limited to breakdown of skin: Secondary | ICD-10-CM | POA: Diagnosis not present

## 2017-11-12 DIAGNOSIS — L97512 Non-pressure chronic ulcer of other part of right foot with fat layer exposed: Secondary | ICD-10-CM | POA: Diagnosis not present

## 2017-11-12 DIAGNOSIS — N2581 Secondary hyperparathyroidism of renal origin: Secondary | ICD-10-CM | POA: Diagnosis not present

## 2017-11-12 DIAGNOSIS — L03115 Cellulitis of right lower limb: Secondary | ICD-10-CM | POA: Diagnosis not present

## 2017-11-12 DIAGNOSIS — B957 Other staphylococcus as the cause of diseases classified elsewhere: Secondary | ICD-10-CM | POA: Diagnosis not present

## 2017-11-14 DIAGNOSIS — E10621 Type 1 diabetes mellitus with foot ulcer: Secondary | ICD-10-CM | POA: Diagnosis not present

## 2017-11-14 DIAGNOSIS — L97511 Non-pressure chronic ulcer of other part of right foot limited to breakdown of skin: Secondary | ICD-10-CM | POA: Diagnosis not present

## 2017-11-14 DIAGNOSIS — B957 Other staphylococcus as the cause of diseases classified elsewhere: Secondary | ICD-10-CM | POA: Diagnosis not present

## 2017-11-14 DIAGNOSIS — E1022 Type 1 diabetes mellitus with diabetic chronic kidney disease: Secondary | ICD-10-CM | POA: Diagnosis not present

## 2017-11-14 DIAGNOSIS — L97512 Non-pressure chronic ulcer of other part of right foot with fat layer exposed: Secondary | ICD-10-CM | POA: Diagnosis not present

## 2017-11-14 DIAGNOSIS — L03115 Cellulitis of right lower limb: Secondary | ICD-10-CM | POA: Diagnosis not present

## 2017-11-15 DIAGNOSIS — E1022 Type 1 diabetes mellitus with diabetic chronic kidney disease: Secondary | ICD-10-CM | POA: Diagnosis not present

## 2017-11-15 DIAGNOSIS — R739 Hyperglycemia, unspecified: Secondary | ICD-10-CM | POA: Diagnosis not present

## 2017-11-15 DIAGNOSIS — N186 End stage renal disease: Secondary | ICD-10-CM | POA: Diagnosis not present

## 2017-11-15 DIAGNOSIS — D509 Iron deficiency anemia, unspecified: Secondary | ICD-10-CM | POA: Diagnosis not present

## 2017-11-15 DIAGNOSIS — M8618 Other acute osteomyelitis, other site: Secondary | ICD-10-CM | POA: Diagnosis not present

## 2017-11-15 DIAGNOSIS — N2581 Secondary hyperparathyroidism of renal origin: Secondary | ICD-10-CM | POA: Diagnosis not present

## 2017-11-16 DIAGNOSIS — L97511 Non-pressure chronic ulcer of other part of right foot limited to breakdown of skin: Secondary | ICD-10-CM | POA: Diagnosis not present

## 2017-11-16 DIAGNOSIS — E1022 Type 1 diabetes mellitus with diabetic chronic kidney disease: Secondary | ICD-10-CM | POA: Diagnosis not present

## 2017-11-16 DIAGNOSIS — L97512 Non-pressure chronic ulcer of other part of right foot with fat layer exposed: Secondary | ICD-10-CM | POA: Diagnosis not present

## 2017-11-16 DIAGNOSIS — E10621 Type 1 diabetes mellitus with foot ulcer: Secondary | ICD-10-CM | POA: Diagnosis not present

## 2017-11-16 DIAGNOSIS — L03115 Cellulitis of right lower limb: Secondary | ICD-10-CM | POA: Diagnosis not present

## 2017-11-16 DIAGNOSIS — B957 Other staphylococcus as the cause of diseases classified elsewhere: Secondary | ICD-10-CM | POA: Diagnosis not present

## 2017-11-17 ENCOUNTER — Encounter: Payer: Self-pay | Admitting: Podiatry

## 2017-11-17 ENCOUNTER — Ambulatory Visit (INDEPENDENT_AMBULATORY_CARE_PROVIDER_SITE_OTHER): Payer: Medicare Other | Admitting: Podiatry

## 2017-11-17 VITALS — Temp 97.8°F

## 2017-11-17 DIAGNOSIS — E1022 Type 1 diabetes mellitus with diabetic chronic kidney disease: Secondary | ICD-10-CM | POA: Diagnosis not present

## 2017-11-17 DIAGNOSIS — M8618 Other acute osteomyelitis, other site: Secondary | ICD-10-CM | POA: Diagnosis not present

## 2017-11-17 DIAGNOSIS — R739 Hyperglycemia, unspecified: Secondary | ICD-10-CM | POA: Diagnosis not present

## 2017-11-17 DIAGNOSIS — N2581 Secondary hyperparathyroidism of renal origin: Secondary | ICD-10-CM | POA: Diagnosis not present

## 2017-11-17 DIAGNOSIS — D509 Iron deficiency anemia, unspecified: Secondary | ICD-10-CM | POA: Diagnosis not present

## 2017-11-17 DIAGNOSIS — L97512 Non-pressure chronic ulcer of other part of right foot with fat layer exposed: Secondary | ICD-10-CM | POA: Diagnosis not present

## 2017-11-17 DIAGNOSIS — N186 End stage renal disease: Secondary | ICD-10-CM | POA: Diagnosis not present

## 2017-11-17 MED ORDER — OXYCODONE-ACETAMINOPHEN 5-325 MG PO TABS: 1.0000 | ORAL_TABLET | Freq: Four times a day (QID) | ORAL | 0 refills | Status: DC | PRN

## 2017-11-17 NOTE — Progress Notes (Signed)
  Subjective:  Patient ID: [name], male    DOB: 12-01-1980,  MRN: [id number]  Chief Complaint  Patient presents with  . Routine Post Op    doing ok on the right foot and the nurse comes every other day    37 y.o. male returns for wound care. States the [facility] nurse is coming qOD. In better spirits today. States he missed his last appt due to a transportation issue. Presents ambulating in regular shoegear today. Denies N/V/F/Ch.  Objective:   Vitals:   11/17/17 1541  Temp: 97.8 F (36.6 C)   General AA&O x3. Normal mood and affect.  Vascular Foot warm to touch.  Neurologic Sensation grossly diminished.  Dermatologic (Wound) Wound Location: R 1st interspace Wound Base: Mixed Granular/Fibrotic Peri-wound: Macerated, Calloused Exudate: Scant/small amount Serosanguinous exudate No purulence. No erythema. No signs of acute infection.   R lateral foot wound 1x1 -> 1.3x1 post-debridement with granular wound base and hyperkeratotic rim.  L lateral hyperkeratosis without signs of open ulceration.  Orthopedic: No pain to palpation either foot.   Assessment & Plan:  Patient was evaluated and treated and all questions answered.  1st Interspace Infection -No signs of acute infection today. -Continue IV Abx at dialysis -Discussed with patient's HHC switching to Santyl qOD to remove wound fibrosis. -Will plan for possible wound VAC at a later date. -Dressed with medihoney and DSD today. -One time post-op refill of percocet today.  Ulcer R Lateral Food -Debridement as below. -Dressed with medihoney, DSD.  Procedure: Excisional Debridement of Wound Rationale: Removal of non-viable soft tissue from the wound to promote healing.  Anesthesia: none Pre-Debridement Wound Measurements: 1 cm x 1 cm x 0.1 cm  Post-Debridement Wound Measurements: 1.3 cm x 1 cm x 0.1 cm  Type of Debridement: Excisional Tissue Removed: Non-viable soft tissue Depth of Debridement: subq Instrumentation:  312 blade, tissue nipper. Technique: Sharp excisional debridement to bleeding, viable wound base.  Dressing: Dry, sterile, compression dressing. Disposition: Patient tolerated procedure well. Patient to return in 1 week for follow-up.  Return in about 1 week (around 11/24/2017).

## 2017-11-18 ENCOUNTER — Other Ambulatory Visit: Payer: Self-pay

## 2017-11-18 ENCOUNTER — Telehealth: Payer: Self-pay | Admitting: Podiatry

## 2017-11-18 DIAGNOSIS — E1022 Type 1 diabetes mellitus with diabetic chronic kidney disease: Secondary | ICD-10-CM | POA: Diagnosis not present

## 2017-11-18 DIAGNOSIS — L03115 Cellulitis of right lower limb: Secondary | ICD-10-CM | POA: Diagnosis not present

## 2017-11-18 DIAGNOSIS — L97512 Non-pressure chronic ulcer of other part of right foot with fat layer exposed: Secondary | ICD-10-CM | POA: Diagnosis not present

## 2017-11-18 DIAGNOSIS — E10621 Type 1 diabetes mellitus with foot ulcer: Secondary | ICD-10-CM | POA: Diagnosis not present

## 2017-11-18 DIAGNOSIS — L97511 Non-pressure chronic ulcer of other part of right foot limited to breakdown of skin: Secondary | ICD-10-CM | POA: Diagnosis not present

## 2017-11-18 DIAGNOSIS — B957 Other staphylococcus as the cause of diseases classified elsewhere: Secondary | ICD-10-CM | POA: Diagnosis not present

## 2017-11-18 MED ORDER — COLLAGENASE 250 UNIT/GM EX OINT: 1.0000 "application " | TOPICAL_OINTMENT | Freq: Every day | CUTANEOUS | 0 refills | Status: DC

## 2017-11-18 NOTE — Telephone Encounter (Signed)
This is [place], RN with [facility]. I spoke with Dr. March Rummage yesterday and he wanted [place] to start using Santyl on right foot wound on Mr. Maahs. It is not in our formulary so he is going to have to write a prescription for that and send it to [place] at [facility] which is Mr. Rockwell' pharmacy. Today I'm going out to see the pt and I'm going to use apathy which is also a debriding agent and I wanted to know if he would sign an order for that today until the Santyl comes in and then we will be able to use that. Please call me back at [phone]. Thanks.

## 2017-11-18 NOTE — Progress Notes (Signed)
Order for santyl sent to pharmacy, [facility] informed

## 2017-11-19 ENCOUNTER — Telehealth: Payer: Self-pay | Admitting: *Deleted

## 2017-11-19 DIAGNOSIS — R739 Hyperglycemia, unspecified: Secondary | ICD-10-CM | POA: Diagnosis not present

## 2017-11-19 DIAGNOSIS — N2581 Secondary hyperparathyroidism of renal origin: Secondary | ICD-10-CM | POA: Diagnosis not present

## 2017-11-19 DIAGNOSIS — D509 Iron deficiency anemia, unspecified: Secondary | ICD-10-CM | POA: Diagnosis not present

## 2017-11-19 DIAGNOSIS — N186 End stage renal disease: Secondary | ICD-10-CM | POA: Diagnosis not present

## 2017-11-19 DIAGNOSIS — M8618 Other acute osteomyelitis, other site: Secondary | ICD-10-CM | POA: Diagnosis not present

## 2017-11-19 DIAGNOSIS — E1022 Type 1 diabetes mellitus with diabetic chronic kidney disease: Secondary | ICD-10-CM | POA: Diagnosis not present

## 2017-11-19 NOTE — Telephone Encounter (Signed)
Spoke with the patient yesterday morning and stated that Dr March Rummage wanted the patient to have a surgical shoe and patient stated that he would be by yesterday afternoon to get the surgical shoe and patient  did not show up. Lattie Haw

## 2017-11-19 NOTE — Telephone Encounter (Signed)
-----   Message from [name], DPM sent at 11/17/2017  5:50 PM EST ----- Regarding: Surgical shoe Did we give him a new surgical shoe today? I told him I would. If not can we call and tell him he can come pick one up.

## 2017-11-20 DIAGNOSIS — L97511 Non-pressure chronic ulcer of other part of right foot limited to breakdown of skin: Secondary | ICD-10-CM | POA: Diagnosis not present

## 2017-11-20 DIAGNOSIS — E1022 Type 1 diabetes mellitus with diabetic chronic kidney disease: Secondary | ICD-10-CM | POA: Diagnosis not present

## 2017-11-20 DIAGNOSIS — E10621 Type 1 diabetes mellitus with foot ulcer: Secondary | ICD-10-CM | POA: Diagnosis not present

## 2017-11-20 DIAGNOSIS — B957 Other staphylococcus as the cause of diseases classified elsewhere: Secondary | ICD-10-CM | POA: Diagnosis not present

## 2017-11-20 DIAGNOSIS — L97512 Non-pressure chronic ulcer of other part of right foot with fat layer exposed: Secondary | ICD-10-CM | POA: Diagnosis not present

## 2017-11-20 DIAGNOSIS — L03115 Cellulitis of right lower limb: Secondary | ICD-10-CM | POA: Diagnosis not present

## 2017-11-22 ENCOUNTER — Encounter (HOSPITAL_COMMUNITY): Payer: Self-pay | Admitting: Emergency Medicine

## 2017-11-22 ENCOUNTER — Other Ambulatory Visit: Payer: Self-pay

## 2017-11-22 DIAGNOSIS — N186 End stage renal disease: Secondary | ICD-10-CM | POA: Diagnosis not present

## 2017-11-22 DIAGNOSIS — N2581 Secondary hyperparathyroidism of renal origin: Secondary | ICD-10-CM | POA: Diagnosis not present

## 2017-11-22 DIAGNOSIS — E1043 Type 1 diabetes mellitus with diabetic autonomic (poly)neuropathy: Secondary | ICD-10-CM | POA: Diagnosis not present

## 2017-11-22 DIAGNOSIS — L03115 Cellulitis of right lower limb: Secondary | ICD-10-CM | POA: Diagnosis not present

## 2017-11-22 DIAGNOSIS — M8618 Other acute osteomyelitis, other site: Secondary | ICD-10-CM | POA: Diagnosis not present

## 2017-11-22 DIAGNOSIS — J101 Influenza due to other identified influenza virus with other respiratory manifestations: Secondary | ICD-10-CM | POA: Insufficient documentation

## 2017-11-22 DIAGNOSIS — Z79899 Other long term (current) drug therapy: Secondary | ICD-10-CM | POA: Diagnosis not present

## 2017-11-22 DIAGNOSIS — R110 Nausea: Secondary | ICD-10-CM | POA: Diagnosis not present

## 2017-11-22 DIAGNOSIS — R531 Weakness: Secondary | ICD-10-CM | POA: Diagnosis present

## 2017-11-22 DIAGNOSIS — L97512 Non-pressure chronic ulcer of other part of right foot with fat layer exposed: Secondary | ICD-10-CM | POA: Diagnosis not present

## 2017-11-22 DIAGNOSIS — I120 Hypertensive chronic kidney disease with stage 5 chronic kidney disease or end stage renal disease: Secondary | ICD-10-CM | POA: Insufficient documentation

## 2017-11-22 DIAGNOSIS — D509 Iron deficiency anemia, unspecified: Secondary | ICD-10-CM | POA: Diagnosis not present

## 2017-11-22 DIAGNOSIS — L97511 Non-pressure chronic ulcer of other part of right foot limited to breakdown of skin: Secondary | ICD-10-CM | POA: Diagnosis not present

## 2017-11-22 DIAGNOSIS — R739 Hyperglycemia, unspecified: Secondary | ICD-10-CM | POA: Diagnosis not present

## 2017-11-22 DIAGNOSIS — E10621 Type 1 diabetes mellitus with foot ulcer: Secondary | ICD-10-CM | POA: Diagnosis not present

## 2017-11-22 DIAGNOSIS — E1022 Type 1 diabetes mellitus with diabetic chronic kidney disease: Secondary | ICD-10-CM | POA: Insufficient documentation

## 2017-11-22 DIAGNOSIS — R05 Cough: Secondary | ICD-10-CM | POA: Diagnosis not present

## 2017-11-22 DIAGNOSIS — B957 Other staphylococcus as the cause of diseases classified elsewhere: Secondary | ICD-10-CM | POA: Diagnosis not present

## 2017-11-22 LAB — COMPREHENSIVE METABOLIC PANEL
ALT: <5 U/L — ABNORMAL LOW (ref 17–63)
AST: 17 U/L (ref 15–41)
Albumin: 3.3 g/dL — ABNORMAL LOW (ref 3.5–5.0)
Alkaline Phosphatase: 99 U/L (ref 38–126)
Anion gap: 23 — ABNORMAL HIGH (ref 5–15)
BUN: 31 mg/dL — ABNORMAL HIGH (ref 6–20)
CO2: 20 mmol/L — ABNORMAL LOW (ref 22–32)
Calcium: 9.1 mg/dL (ref 8.9–10.3)
Chloride: 92 mmol/L — ABNORMAL LOW (ref 101–111)
Creatinine, Ser: 7.96 mg/dL — ABNORMAL HIGH (ref 0.61–1.24)
GFR calc Af Amer: 9 mL/min — ABNORMAL LOW (ref >60)
GFR calc non Af Amer: 8 mL/min — ABNORMAL LOW (ref >60)
Glucose, Bld: 228 mg/dL — ABNORMAL HIGH (ref 65–99)
Potassium: 5.2 mmol/L — ABNORMAL HIGH (ref 3.5–5.1)
Sodium: 135 mmol/L (ref 135–145)
Total Bilirubin: 1.2 mg/dL (ref 0.3–1.2)
Total Protein: 7.9 g/dL (ref 6.5–8.1)

## 2017-11-22 LAB — CBC
HCT: 35.8 % — ABNORMAL LOW (ref 39.0–52.0)
Hemoglobin: 11.3 g/dL — ABNORMAL LOW (ref 13.0–17.0)
MCH: 30 pg (ref 26.0–34.0)
MCHC: 31.6 g/dL (ref 30.0–36.0)
MCV: 95 fL (ref 78.0–100.0)
Platelets: 161 10*3/uL (ref 150–400)
RBC: 3.77 MIL/uL — ABNORMAL LOW (ref 4.22–5.81)
RDW: 14.9 % (ref 11.5–15.5)
WBC: 7 10*3/uL (ref 4.0–10.5)

## 2017-11-22 LAB — I-STAT CG4 LACTIC ACID, ED: Lactic Acid, Venous: 1.55 mmol/L (ref 0.5–1.9)

## 2017-11-22 LAB — LIPASE, BLOOD: Lipase: 21 U/L (ref 11–51)

## 2017-11-22 LAB — CBG MONITORING, ED: Glucose-Capillary: 316 mg/dL — ABNORMAL HIGH (ref 65–99)

## 2017-11-22 NOTE — ED Triage Notes (Signed)
Pt c/o generalized weakness, chills and nausea that began last night. Dialysis pt, MWF, had full treatment today. Denies shortness of breath/chest pain. Reports recent surgery to right foot.

## 2017-11-23 ENCOUNTER — Emergency Department (HOSPITAL_COMMUNITY)
Admission: EM | Admit: 2017-11-23 | Discharge: 2017-11-23 | Disposition: A | Discharge status: Home / Self Care | Payer: Medicare Other | Attending: Emergency Medicine | Admitting: Emergency Medicine

## 2017-11-23 ENCOUNTER — Emergency Department (HOSPITAL_COMMUNITY): Payer: Medicare Other

## 2017-11-23 DIAGNOSIS — J101 Influenza due to other identified influenza virus with other respiratory manifestations: Secondary | ICD-10-CM | POA: Diagnosis not present

## 2017-11-23 DIAGNOSIS — R05 Cough: Secondary | ICD-10-CM | POA: Diagnosis not present

## 2017-11-23 DIAGNOSIS — R110 Nausea: Secondary | ICD-10-CM | POA: Diagnosis not present

## 2017-11-23 DIAGNOSIS — R531 Weakness: Secondary | ICD-10-CM | POA: Diagnosis not present

## 2017-11-23 LAB — INFLUENZA PANEL BY PCR (TYPE A & B)
Influenza A By PCR: POSITIVE — AB
Influenza B By PCR: NEGATIVE

## 2017-11-23 MED ORDER — OSELTAMIVIR PHOSPHATE 30 MG PO CAPS
30.0000 mg | ORAL_CAPSULE | Freq: Once | ORAL | Status: AC
  Administered 2017-11-23: 30 mg via ORAL
  Filled 2017-11-23: qty 1

## 2017-11-23 MED ORDER — METOCLOPRAMIDE HCL 5 MG/ML IJ SOLN
10.0000 mg | Freq: Once | INTRAMUSCULAR | Status: DC
  Filled 2017-11-23: qty 2

## 2017-11-23 MED ORDER — OSELTAMIVIR PHOSPHATE 30 MG PO CAPS: ORAL_CAPSULE | ORAL | 0 refills | Status: DC

## 2017-11-23 MED ORDER — OSELTAMIVIR PHOSPHATE 30 MG PO CAPS: 30.0000 mg | ORAL_CAPSULE | Freq: Two times a day (BID) | ORAL | 0 refills | Status: DC

## 2017-11-23 MED ORDER — ONDANSETRON 4 MG PO TBDP: 4.0000 mg | ORAL_TABLET | Freq: Three times a day (TID) | ORAL | 0 refills | Status: DC | PRN

## 2017-11-23 MED ORDER — METOCLOPRAMIDE HCL 5 MG/ML IJ SOLN
10.0000 mg | Freq: Once | INTRAMUSCULAR | Status: AC
  Administered 2017-11-23: 10 mg via INTRAMUSCULAR

## 2017-11-23 NOTE — Discharge Instructions (Signed)
It was my pleasure taking care of you today!   You have tested positive for the flu. You will need to take tamiflu after each dialysis treatment.  Zofran as needed for nausea.  Follow up with your primary care provider.   Return to ER for new or worsening symptoms, any additional concerns.

## 2017-11-23 NOTE — ED Provider Notes (Signed)
[facility] EMERGENCY DEPARTMENT Provider Note   CSN: [id number] Arrival date & time: 11/22/17  1027     History   Chief Complaint Chief Complaint  Patient presents with  . Weakness    HPI [name] is a 37 y.o. male.  The history is provided by the patient and medical records. No language interpreter was used.  Weakness  Pertinent negatives include no shortness of breath, no chest pain and no vomiting.   [name] is a 37 y.o. male  with a PMH of DM, gastroparesis, HTN, ESRD on dialysis who presents to the Emergency Department complaining of generalized weakness, body aches, chills and nausea that began yesterday.  Associated with cough.  Denies any chest pain or shortness of breath.  He has been struggling with wound to right foot, but reports that it is healing well.  He had debridement from podiatry recently and has nurse come every other day to clean it.  She came yesterday and feels as if it is healing well.  No medications taken prior to arrival.  Dialysis on Monday, Wednesday Friday.  Reports going to dialysis on Monday and receiving full treatment.   Past Medical History:  Diagnosis Date  . Diabetes mellitus without complication ([place])   . Diabetic gastroparesis ([place])   . Dialysis patient ([place])   . Hypertension   . Renal disorder    Dialysis  . Sepsis [facility])     Patient Active Problem List   Diagnosis Date Noted  . Delirium   . Altered mental status   . Ulcerated, foot, right, limited to breakdown of skin ([place])   . Osteomyelitis of ankle or foot, acute, right ([place]) 10/30/2017  . Abscess or cellulitis of toe, right   . Cellulitis of right lower extremity   . Right foot infection   . Diabetic foot infection ([place])   . Cellulitis of right foot 10/26/2017  . Diabetes mellitus type 1 ([place]) 10/25/2017  . Diabetic gastroparesis ([place]) 10/25/2017  . Hemodialysis patient ([place]) 10/25/2017  . Sepsis ([place]) 10/25/2017  . DKA (diabetic  ketoacidoses) ([place]) 05/27/2015  . Essential (primary) hypertension 08/09/2014  . ESRD (end stage renal disease) on dialysis ([place]) 03/19/2014  . Pain, abdominal, generalized 03/19/2014  . Bacteremia due to coagulase-negative Staphylococcus 05/31/2013  . Leukocytosis 05/31/2013  . Hyponatremia 05/29/2013  . Metabolic acidosis 25/36/6440    Past Surgical History:  Procedure Laterality Date  . AV FISTULA PLACEMENT     left arm.  . AV FISTULA PLACEMENT Right 12/22/2016   Procedure: INSERTION OF ARTERIOVENOUS (AV) GORE-TEX GRAFT ARM;  Surgeon: Elam Dutch, MD;  Location: [facility];  Service: Vascular;  Laterality: Right;  . EYE SURGERY    . I&D EXTREMITY Right 10/31/2017   Procedure: IRRIGATION AND DEBRIDEMENT RIGHT FOOT;  Surgeon: Evelina Bucy, DPM;  Location: [facility];  Service: Podiatry;  Laterality: Right;  . INSERTION OF DIALYSIS CATHETER     Right subclavian  . UPPER EXTREMITY VENOGRAPHY N/A 11/16/2016   Procedure: Upper Extremity Venography - Right Central;  Surgeon: Elam Dutch, MD;  Location: [facility] CV LAB;  Service: Cardiovascular;  Laterality: N/A;       Home Medications    Prior to Admission medications   Medication Sig Start Date End Date Taking? Authorizing Provider  acetaminophen (TYLENOL) 325 MG tablet Take 2 tablets (650 mg total) by mouth every 6 (six) hours as needed for mild pain or headache (pain/headache). Can restart this after you  are done with Oxycodone/Tylenol 11/10/17  Yes Domenic Polite, MD  amLODipine (NORVASC) 10 MG tablet Take 10 mg by mouth daily.   Yes [provider]  ceFAZolin (ANCEF) IVPB Inject 2 g into the vein every Monday, Wednesday, and Friday with hemodialysis. For 3 weeks 11/05/17  Yes Evelina Bucy, DPM  cinacalcet (SENSIPAR) 30 MG tablet Take 30 mg by mouth daily with supper.   Yes [provider]  clonazePAM (KLONOPIN) 0.5 MG tablet Take 0.5 mg by mouth daily as needed for anxiety (sleep).  07/21/17  Yes [provider]  collagenase (SANTYL) ointment Apply 1 application topically daily. 11/18/17  Yes Evelina Bucy, DPM  insulin aspart (NOVOLOG) 100 UNIT/ML injection Inject 5 Units into the skin 2 (two) times daily with a meal.    Yes [provider]  insulin glargine (LANTUS) 100 UNIT/ML injection Inject 0.2 mLs (20 Units total) into the skin at bedtime. 11/03/17  Yes Domenic Polite, MD  losartan (COZAAR) 50 MG tablet Take 50 mg by mouth daily. 09/27/17  Yes [provider]  oxyCODONE-acetaminophen (PERCOCET/ROXICET) 5-325 MG tablet Take 1 tablet by mouth every 6 (six) hours as needed. 11/17/17  Yes Evelina Bucy, DPM  sevelamer carbonate (RENVELA) 800 MG tablet Take 2,400-3,200 mg by mouth See admin instructions. Take 3 - 4 tablets (2400 mg -3200 mg) by mouth two times daily with meals   Yes [provider]  ondansetron (ZOFRAN ODT) 4 MG disintegrating tablet Take 1 tablet (4 mg total) by mouth every 8 (eight) hours as needed for nausea or vomiting. 11/23/17   Ward, Ozella Almond, PA-C  oseltamivir (TAMIFLU) 30 MG capsule Take 30mg  orally immediately after every dialysis treatment.  First dose given in ER. 11/23/17   Ward, Ozella Almond, PA-C    Family History Family History  Problem Relation Age of Onset  . Diabetes Mellitus II Unknown     Social History Social History   Tobacco Use  . Smoking status: Never Smoker  . Smokeless tobacco: Never Used  Substance Use Topics  . Alcohol use: No  . Drug use: No     Allergies   No known allergies   Review of Systems Review of Systems  Constitutional: Positive for chills.  HENT: Positive for congestion.   Respiratory: Positive for cough. Negative for shortness of breath.   Cardiovascular: Negative for chest pain.  Gastrointestinal: Positive for nausea. Negative for abdominal pain, constipation, diarrhea and vomiting.  Neurological: Positive for weakness.  All other systems reviewed and are  negative.    Physical Exam Updated Vital Signs BP (!) 167/86   Pulse 98   Temp 98.9 F (37.2 C) (Oral)   Resp 19   Ht 5\' 6"  (1.676 m)   SpO2 96%   BMI 26.87 kg/m   Physical Exam  Constitutional: He is oriented to person, place, and time. He appears well-developed and well-nourished. No distress.  HENT:  Head: Normocephalic and atraumatic.  Cardiovascular: Normal rate, regular rhythm and normal heart sounds.  No murmur heard. Pulmonary/Chest: Effort normal and breath sounds normal. No respiratory distress.  Abdominal: Soft. Bowel sounds are normal. He exhibits no distension.  Diffuse abdominal tenderness without focality.  No rebound or guarding.  Neurological: He is alert and oriented to person, place, and time.  Skin: Skin is warm and dry.  Nursing note and vitals reviewed.    ED Treatments / Results  Labs (all labs ordered are listed, but only abnormal results are displayed) Labs Reviewed  COMPREHENSIVE METABOLIC PANEL - Abnormal; Notable for the following components:      Result Value   Potassium 5.2 (*)    Chloride 92 (*)    CO2 20 (*)    Glucose, Bld 228 (*)    BUN 31 (*)    Creatinine, Ser 7.96 (*)    Albumin 3.3 (*)    ALT <5 (*)    GFR calc non Af Amer 8 (*)    GFR calc Af Amer 9 (*)    Anion gap 23 (*)    All other components within normal limits  CBC - Abnormal; Notable for the following components:   RBC 3.77 (*)    Hemoglobin 11.3 (*)    HCT 35.8 (*)    All other components within normal limits  INFLUENZA PANEL BY PCR (TYPE A & B) - Abnormal; Notable for the following components:   Influenza A By PCR POSITIVE (*)    All other components within normal limits  CBG MONITORING, ED - Abnormal; Notable for the following components:   Glucose-Capillary 316 (*)    All other components within normal limits  LIPASE, BLOOD  I-STAT CG4 LACTIC ACID, ED    EKG  EKG Interpretation None       Radiology Dg Abdomen Acute W/chest  Result Date:  11/23/2017 CLINICAL DATA:  37 year old male with cough and weakness. EXAM: DG ABDOMEN ACUTE W/ 1V CHEST COMPARISON:  Chest radiograph dated 10/25/2017 and CT of the abdomen pelvis dated 07/30/2014 FINDINGS: The lungs are clear. There is no pleural effusion or pneumothorax. There is mild cardiomegaly. There is moderate stool throughout the colon. No bowel dilatation or evidence of obstruction. No free air or radiopaque calculi. The osseous structures and soft tissues are unremarkable. IMPRESSION: 1. No acute cardiopulmonary process. 2. Cardiomegaly. 3. No bowel obstruction. Electronically Signed   By: Anner Crete M.D.   On: 11/23/2017 04:19    Procedures Procedures (including critical care time)  Medications Ordered in ED Medications  metoCLOPramide (REGLAN) injection 10 mg (10 mg Intramuscular Given 11/23/17 0415)  oseltamivir (TAMIFLU) capsule 30 mg (30 mg Oral Given 11/23/17 0604)     Initial Impression / Assessment and Plan / ED Course  I have reviewed the triage vital signs and the nursing notes.  Pertinent labs & imaging results that were available during my care of the patient were reviewed by me and considered in my medical decision making (see chart for details).    [name] is a 37 y.o. male who presents to ED for generalized weakness, nausea, cough, congestion.  Temperature of 100 in triage.  Repeat temperature of 98.  Hemodynamically stable.  Nonsurgical abdomen.  Lungs clear.  Chest and abdominal plain film without acute abnormalities.  Labs near baseline.  He does go to dialysis tomorrow.  Flu swab positive.  Will treat with Tamiflu.  Adjusted for ESRD on dialysis.  Symptomatic home care and follow-up plan discussed.  Reasons to return to ER discussed and all questions answered.  Patient discussed with Dr. Betsey Holiday who agrees with treatment plan.   Final Clinical Impressions(s) / ED Diagnoses   Final diagnoses:  Influenza A    ED Discharge Orders        Ordered     oseltamivir (TAMIFLU) 30 MG capsule  Every 12 hours,   Status:  Discontinued     11/23/17 0600    oseltamivir (TAMIFLU) 30 MG capsule     11/23/17 0605    ondansetron (ZOFRAN ODT)  4 MG disintegrating tablet  Every 8 hours PRN     11/23/17 0605       Ward, Ozella Almond, PA-C 11/23/17 7096    Orpah Greek, MD 11/23/17 [phone]

## 2017-11-24 ENCOUNTER — Telehealth: Payer: Self-pay

## 2017-11-24 ENCOUNTER — Other Ambulatory Visit: Payer: Self-pay

## 2017-11-24 DIAGNOSIS — L03115 Cellulitis of right lower limb: Secondary | ICD-10-CM | POA: Diagnosis not present

## 2017-11-24 DIAGNOSIS — B957 Other staphylococcus as the cause of diseases classified elsewhere: Secondary | ICD-10-CM | POA: Diagnosis not present

## 2017-11-24 DIAGNOSIS — N2581 Secondary hyperparathyroidism of renal origin: Secondary | ICD-10-CM | POA: Diagnosis not present

## 2017-11-24 DIAGNOSIS — E1022 Type 1 diabetes mellitus with diabetic chronic kidney disease: Secondary | ICD-10-CM | POA: Diagnosis not present

## 2017-11-24 DIAGNOSIS — R739 Hyperglycemia, unspecified: Secondary | ICD-10-CM | POA: Diagnosis not present

## 2017-11-24 DIAGNOSIS — L97512 Non-pressure chronic ulcer of other part of right foot with fat layer exposed: Secondary | ICD-10-CM | POA: Diagnosis not present

## 2017-11-24 DIAGNOSIS — D509 Iron deficiency anemia, unspecified: Secondary | ICD-10-CM | POA: Diagnosis not present

## 2017-11-24 DIAGNOSIS — E10621 Type 1 diabetes mellitus with foot ulcer: Secondary | ICD-10-CM | POA: Diagnosis not present

## 2017-11-24 DIAGNOSIS — N186 End stage renal disease: Secondary | ICD-10-CM | POA: Diagnosis not present

## 2017-11-24 DIAGNOSIS — L97511 Non-pressure chronic ulcer of other part of right foot limited to breakdown of skin: Secondary | ICD-10-CM | POA: Diagnosis not present

## 2017-11-24 DIAGNOSIS — M8618 Other acute osteomyelitis, other site: Secondary | ICD-10-CM | POA: Diagnosis not present

## 2017-11-24 MED ORDER — COLLAGENASE 250 UNIT/GM EX OINT: 1.0000 "application " | TOPICAL_OINTMENT | Freq: Every day | CUTANEOUS | 0 refills | Status: DC

## 2017-11-24 NOTE — Progress Notes (Signed)
Faxed over order for santyl to [facility] specialty pharmacy  fax [phone]

## 2017-11-24 NOTE — Telephone Encounter (Signed)
[name] from [facility] called to request dressing changes M,W,F, and to report that patient's wound was not improving, it looked as if it was getting worse. Approved dressing change schedule and informed her that I would let Dr March Rummage know of this. We are awaiting response from [place] in regards to the [facility]

## 2017-11-25 ENCOUNTER — Ambulatory Visit (INDEPENDENT_AMBULATORY_CARE_PROVIDER_SITE_OTHER): Payer: Medicare Other | Admitting: Podiatry

## 2017-11-25 ENCOUNTER — Encounter: Payer: Self-pay | Admitting: Podiatry

## 2017-11-25 DIAGNOSIS — I739 Peripheral vascular disease, unspecified: Secondary | ICD-10-CM

## 2017-11-25 DIAGNOSIS — L97511 Non-pressure chronic ulcer of other part of right foot limited to breakdown of skin: Secondary | ICD-10-CM | POA: Diagnosis not present

## 2017-11-25 DIAGNOSIS — L97512 Non-pressure chronic ulcer of other part of right foot with fat layer exposed: Secondary | ICD-10-CM

## 2017-11-25 NOTE — Progress Notes (Signed)
  Subjective:  Patient ID: [name], male    DOB: 09-04-1981,  MRN: [id number]  Chief Complaint  Patient presents with  . Wound Check    Right foot - pt stated "insurance won't cover santyl and just got last dose of IV abx yesterday"    37 y.o. male returns for wound care. States his insurance will [employer].  States that he got his last dose of IV antibiotics history of dialysis.  Endorses recent flu.. Denies N/V/F/Ch.  Objective:  There were no vitals filed for this visit. General AA&O x3. Normal mood and affect.  Vascular Foot warm to touch.  Neurologic Sensation grossly diminished.  Dermatologic (Wound) Wound Location: 1st Interspace R Wound Measurement: 4x2 x 0.5 Wound Base: Fibrotic slough Peri-wound: Calloused Exudate: None: wound tissue dry  Wound progress: Decreased since last check.  R 5th MPJ lateral ulcer approx 1.5 x 1.5 x 0.2  with significant hyperkeratosis  Orthopedic: No pain to palpation either foot.   Assessment & Plan:  Patient was evaluated and treated and all questions answered.  Ulcer 1st Interspace -Slow to heal with significant fibrotic tissue. -Excisional debridement as below. -Discussed benefit of performing debridement with wound VAC application to accelerate healing.  Patient does not wish to pursue this.  Patient states that it is not compatible with his lifestyle and he cannot walk around with a "fanny pack."  Discussed that this is necessary to accelerate healing and prevent further tissue loss.  Patient wishes to pursue other alternatives.  We will see especially pharmacy was able to cover the collagenase medication for patient for enzymatic debridement however I am concerned that the continued up to the wound may lead to further complications should VAC therapy not be pursued. -Dressed with wet-to-dry dressing  -Surgical shoe dispensed.  Patient did not come and pick up the surgical shoe as advised due to lack of transportation.  He  continues to wear sandals.  PAD -We will order ABIs to assess vascular status due to delayed healing.   Procedure: Excisional Debridement of Wound Rationale: Removal of non-viable soft tissue from the wound to promote healing.  Anesthesia: none Pre-Debridement Wound Measurements: 4 cm x 2 cm x 0.5 cm  Post-Debridement Wound Measurements: 4.5 cm x 2 cm x 0.5 cm  Type of Debridement: Excisional Tissue Removed: Non-viable soft tissue Depth of Debridement: fat layer Instrumentation: 3-0 mm dermal curette Technique: Sharp excisional debridement to bleeding, viable wound base.  Dressing: Dry, sterile, compression dressing. Disposition: Patient tolerated procedure well. Patient to return in 1 week for follow-up.  Ulcer R 5th MPJ -Debridement as below. -Dressed with WTD, DSD.  Procedure: Selective Debridement of Wound Rationale: Removal of devitalized tissue from the wound to promote healing.  Pre-Debridement Wound Measurements: 1.5x1.5x0.2  Post-Debridement Wound Measurements: same as pre-debridement. Type of Debridement: Selective Tissue Removed: Devitalized soft-tissue Instrumentation: 3-0 mm dermal curette Dressing: Dry, sterile, compression dressing. Disposition: Patient tolerated procedure well. Patient to return in 1 week for follow-up.  No Follow-up on file.

## 2017-11-26 ENCOUNTER — Telehealth: Payer: Self-pay | Admitting: *Deleted

## 2017-11-26 ENCOUNTER — Other Ambulatory Visit: Payer: Self-pay | Admitting: Podiatry

## 2017-11-26 ENCOUNTER — Telehealth: Payer: Self-pay | Admitting: Podiatry

## 2017-11-26 DIAGNOSIS — L97512 Non-pressure chronic ulcer of other part of right foot with fat layer exposed: Secondary | ICD-10-CM | POA: Diagnosis not present

## 2017-11-26 DIAGNOSIS — N186 End stage renal disease: Secondary | ICD-10-CM | POA: Diagnosis not present

## 2017-11-26 DIAGNOSIS — E10621 Type 1 diabetes mellitus with foot ulcer: Secondary | ICD-10-CM | POA: Diagnosis not present

## 2017-11-26 DIAGNOSIS — N2581 Secondary hyperparathyroidism of renal origin: Secondary | ICD-10-CM | POA: Diagnosis not present

## 2017-11-26 DIAGNOSIS — E1022 Type 1 diabetes mellitus with diabetic chronic kidney disease: Secondary | ICD-10-CM | POA: Diagnosis not present

## 2017-11-26 DIAGNOSIS — L03115 Cellulitis of right lower limb: Secondary | ICD-10-CM | POA: Diagnosis not present

## 2017-11-26 DIAGNOSIS — D509 Iron deficiency anemia, unspecified: Secondary | ICD-10-CM | POA: Diagnosis not present

## 2017-11-26 DIAGNOSIS — B957 Other staphylococcus as the cause of diseases classified elsewhere: Secondary | ICD-10-CM | POA: Diagnosis not present

## 2017-11-26 DIAGNOSIS — R0989 Other specified symptoms and signs involving the circulatory and respiratory systems: Secondary | ICD-10-CM

## 2017-11-26 DIAGNOSIS — Z992 Dependence on renal dialysis: Secondary | ICD-10-CM | POA: Diagnosis not present

## 2017-11-26 DIAGNOSIS — R739 Hyperglycemia, unspecified: Secondary | ICD-10-CM | POA: Diagnosis not present

## 2017-11-26 DIAGNOSIS — I129 Hypertensive chronic kidney disease with stage 1 through stage 4 chronic kidney disease, or unspecified chronic kidney disease: Secondary | ICD-10-CM | POA: Diagnosis not present

## 2017-11-26 DIAGNOSIS — L97511 Non-pressure chronic ulcer of other part of right foot limited to breakdown of skin: Secondary | ICD-10-CM | POA: Diagnosis not present

## 2017-11-26 MED ORDER — OXYCODONE-ACETAMINOPHEN 5-325 MG PO TABS: 1.0000 | ORAL_TABLET | Freq: Four times a day (QID) | ORAL | 0 refills | Status: DC | PRN

## 2017-11-26 NOTE — Progress Notes (Signed)
Refilled percocet- spoke to Dr. March Rummage and he did not send it yesterday but Okayed the request.

## 2017-11-26 NOTE — Telephone Encounter (Signed)
Faxed required form, clinicals and demographics to [facility].

## 2017-11-26 NOTE — Telephone Encounter (Signed)
Pt states he forgot to get Dr. March Rummage to write for the oxycodone, so after getting two rides and going to two pharmacies where the prescription was not, he called to the after hours number really pissed off and got Dr. Nyoka Cowden to call in a prescription. Pt states Dr. Nyoka Cowden called in the tramadol and said he could pick up the oxycodone in the morning. I told pt we did not have a Dr. Nyoka Cowden, then pt stated Dr. Amalia Hailey, and that Dr. Amalia Hailey had said he would call him again today before 11:00am and he did not and pt stated I could tell Dr. Amalia Hailey that, because a man is suppose to keep his word. I told pt, Dr. Amalia Hailey was seeing pts today also, that I would speak with the doctor in office today.

## 2017-11-26 NOTE — Telephone Encounter (Signed)
This Rose calling from [facility]. I'm calling about a prescription we received for Charm Barges for a santyl order. We are calling to get the wound sizes, days supply, and diagnosis codes. If you would please give [place] a call back at [phone]. Thank you.

## 2017-11-26 NOTE — Telephone Encounter (Signed)
I spoke with pt and informed that the oxycodone had been escribed to the pharmacy at 2:17pm. Pt states the pharmacist at [name] had been listening to the conversation and said he had not been threatening. Pt stated again he was not threatening and was sorry our staff felt that way. I told pt that the prescription was there.

## 2017-11-26 NOTE — Telephone Encounter (Signed)
-----   Message from Evelina Bucy, DPM sent at 11/25/2017  9:44 AM EST ----- Regarding: ABIs Can we order arterial studies - ABI with segmental pressures, Toe brachial index  Thanks!

## 2017-11-26 NOTE — Telephone Encounter (Signed)
Orders faxed to [facility]. 

## 2017-11-26 NOTE — Telephone Encounter (Signed)
Pt called and yelled at [place], that he wanted to talk to someone now. I spoke with pt and told him the staff found his attitude to be very threatening and it was not appreciated and did not move his request any faster, that Dr. Jacqualyn Posey was in the pt room performing a procedure and once he was finished I would check his oxycodone request. Pt states he is not threatening and if the staff thought he was it was their fault. Pt stated he was recording our conversation and he wasn't threatening. I told pt all of our pt conversations were recorded as well. I told him, his attitude was threatening and I had explained that I would call him when the prescription was ready. Pt states the pharmacy would be closing soon for a certain amount of time and he wanted to get the prescription. I told pt I was helping other pts and would speak with Dr. Jacqualyn Posey and get back with him as soon as possible. 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 Amalia Hailey wasn't taking care of it. I told pt Dr. Amalia Hailey was taking care of pts out of the office, and I was getting his request taken care of in-office, which would be quicker. Pt stated he was frustrated and apologized to our staff for sounding threatening. I told pt if he would just let me find out his information I would call back as quickly as possible.

## 2017-11-29 DIAGNOSIS — R739 Hyperglycemia, unspecified: Secondary | ICD-10-CM | POA: Diagnosis not present

## 2017-11-29 DIAGNOSIS — B957 Other staphylococcus as the cause of diseases classified elsewhere: Secondary | ICD-10-CM | POA: Diagnosis not present

## 2017-11-29 DIAGNOSIS — E1022 Type 1 diabetes mellitus with diabetic chronic kidney disease: Secondary | ICD-10-CM | POA: Diagnosis not present

## 2017-11-29 DIAGNOSIS — N2581 Secondary hyperparathyroidism of renal origin: Secondary | ICD-10-CM | POA: Diagnosis not present

## 2017-11-29 DIAGNOSIS — D509 Iron deficiency anemia, unspecified: Secondary | ICD-10-CM | POA: Diagnosis not present

## 2017-11-29 DIAGNOSIS — N186 End stage renal disease: Secondary | ICD-10-CM | POA: Diagnosis not present

## 2017-11-29 DIAGNOSIS — E10621 Type 1 diabetes mellitus with foot ulcer: Secondary | ICD-10-CM | POA: Diagnosis not present

## 2017-11-29 DIAGNOSIS — L97511 Non-pressure chronic ulcer of other part of right foot limited to breakdown of skin: Secondary | ICD-10-CM | POA: Diagnosis not present

## 2017-11-29 DIAGNOSIS — L97512 Non-pressure chronic ulcer of other part of right foot with fat layer exposed: Secondary | ICD-10-CM | POA: Diagnosis not present

## 2017-11-29 DIAGNOSIS — L03115 Cellulitis of right lower limb: Secondary | ICD-10-CM | POA: Diagnosis not present

## 2017-12-01 DIAGNOSIS — E10621 Type 1 diabetes mellitus with foot ulcer: Secondary | ICD-10-CM | POA: Diagnosis not present

## 2017-12-01 DIAGNOSIS — E1022 Type 1 diabetes mellitus with diabetic chronic kidney disease: Secondary | ICD-10-CM | POA: Diagnosis not present

## 2017-12-01 DIAGNOSIS — L03115 Cellulitis of right lower limb: Secondary | ICD-10-CM | POA: Diagnosis not present

## 2017-12-01 DIAGNOSIS — N186 End stage renal disease: Secondary | ICD-10-CM | POA: Diagnosis not present

## 2017-12-01 DIAGNOSIS — L97512 Non-pressure chronic ulcer of other part of right foot with fat layer exposed: Secondary | ICD-10-CM | POA: Diagnosis not present

## 2017-12-01 DIAGNOSIS — L97511 Non-pressure chronic ulcer of other part of right foot limited to breakdown of skin: Secondary | ICD-10-CM | POA: Diagnosis not present

## 2017-12-01 DIAGNOSIS — D509 Iron deficiency anemia, unspecified: Secondary | ICD-10-CM | POA: Diagnosis not present

## 2017-12-01 DIAGNOSIS — R739 Hyperglycemia, unspecified: Secondary | ICD-10-CM | POA: Diagnosis not present

## 2017-12-01 DIAGNOSIS — N2581 Secondary hyperparathyroidism of renal origin: Secondary | ICD-10-CM | POA: Diagnosis not present

## 2017-12-01 DIAGNOSIS — B957 Other staphylococcus as the cause of diseases classified elsewhere: Secondary | ICD-10-CM | POA: Diagnosis not present

## 2017-12-02 ENCOUNTER — Telehealth: Payer: Self-pay | Admitting: *Deleted

## 2017-12-02 ENCOUNTER — Ambulatory Visit (INDEPENDENT_AMBULATORY_CARE_PROVIDER_SITE_OTHER): Payer: Medicare Other | Admitting: Podiatry

## 2017-12-02 DIAGNOSIS — L97512 Non-pressure chronic ulcer of other part of right foot with fat layer exposed: Secondary | ICD-10-CM | POA: Diagnosis not present

## 2017-12-02 NOTE — Telephone Encounter (Signed)
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 March Rummage says, if pt continues to be hostile and threatening to the [place] and [place] staff pt will be dismissed from the practice.

## 2017-12-03 DIAGNOSIS — L03115 Cellulitis of right lower limb: Secondary | ICD-10-CM | POA: Diagnosis not present

## 2017-12-03 DIAGNOSIS — L97512 Non-pressure chronic ulcer of other part of right foot with fat layer exposed: Secondary | ICD-10-CM | POA: Diagnosis not present

## 2017-12-03 DIAGNOSIS — D509 Iron deficiency anemia, unspecified: Secondary | ICD-10-CM | POA: Diagnosis not present

## 2017-12-03 DIAGNOSIS — B957 Other staphylococcus as the cause of diseases classified elsewhere: Secondary | ICD-10-CM | POA: Diagnosis not present

## 2017-12-03 DIAGNOSIS — E10621 Type 1 diabetes mellitus with foot ulcer: Secondary | ICD-10-CM | POA: Diagnosis not present

## 2017-12-03 DIAGNOSIS — N186 End stage renal disease: Secondary | ICD-10-CM | POA: Diagnosis not present

## 2017-12-03 DIAGNOSIS — L97511 Non-pressure chronic ulcer of other part of right foot limited to breakdown of skin: Secondary | ICD-10-CM | POA: Diagnosis not present

## 2017-12-03 DIAGNOSIS — R739 Hyperglycemia, unspecified: Secondary | ICD-10-CM | POA: Diagnosis not present

## 2017-12-03 DIAGNOSIS — E1022 Type 1 diabetes mellitus with diabetic chronic kidney disease: Secondary | ICD-10-CM | POA: Diagnosis not present

## 2017-12-03 DIAGNOSIS — N2581 Secondary hyperparathyroidism of renal origin: Secondary | ICD-10-CM | POA: Diagnosis not present

## 2017-12-06 DIAGNOSIS — E10621 Type 1 diabetes mellitus with foot ulcer: Secondary | ICD-10-CM | POA: Diagnosis not present

## 2017-12-06 DIAGNOSIS — L97511 Non-pressure chronic ulcer of other part of right foot limited to breakdown of skin: Secondary | ICD-10-CM | POA: Diagnosis not present

## 2017-12-06 DIAGNOSIS — L03115 Cellulitis of right lower limb: Secondary | ICD-10-CM | POA: Diagnosis not present

## 2017-12-06 DIAGNOSIS — L97512 Non-pressure chronic ulcer of other part of right foot with fat layer exposed: Secondary | ICD-10-CM | POA: Diagnosis not present

## 2017-12-06 DIAGNOSIS — D509 Iron deficiency anemia, unspecified: Secondary | ICD-10-CM | POA: Diagnosis not present

## 2017-12-06 DIAGNOSIS — B957 Other staphylococcus as the cause of diseases classified elsewhere: Secondary | ICD-10-CM | POA: Diagnosis not present

## 2017-12-06 DIAGNOSIS — N186 End stage renal disease: Secondary | ICD-10-CM | POA: Diagnosis not present

## 2017-12-06 DIAGNOSIS — E1022 Type 1 diabetes mellitus with diabetic chronic kidney disease: Secondary | ICD-10-CM | POA: Diagnosis not present

## 2017-12-06 DIAGNOSIS — N2581 Secondary hyperparathyroidism of renal origin: Secondary | ICD-10-CM | POA: Diagnosis not present

## 2017-12-06 DIAGNOSIS — R739 Hyperglycemia, unspecified: Secondary | ICD-10-CM | POA: Diagnosis not present

## 2017-12-08 DIAGNOSIS — E10621 Type 1 diabetes mellitus with foot ulcer: Secondary | ICD-10-CM | POA: Diagnosis not present

## 2017-12-08 DIAGNOSIS — L97511 Non-pressure chronic ulcer of other part of right foot limited to breakdown of skin: Secondary | ICD-10-CM | POA: Diagnosis not present

## 2017-12-08 DIAGNOSIS — B957 Other staphylococcus as the cause of diseases classified elsewhere: Secondary | ICD-10-CM | POA: Diagnosis not present

## 2017-12-08 DIAGNOSIS — L97512 Non-pressure chronic ulcer of other part of right foot with fat layer exposed: Secondary | ICD-10-CM | POA: Diagnosis not present

## 2017-12-08 DIAGNOSIS — L03115 Cellulitis of right lower limb: Secondary | ICD-10-CM | POA: Diagnosis not present

## 2017-12-08 DIAGNOSIS — N186 End stage renal disease: Secondary | ICD-10-CM | POA: Diagnosis not present

## 2017-12-08 DIAGNOSIS — D509 Iron deficiency anemia, unspecified: Secondary | ICD-10-CM | POA: Diagnosis not present

## 2017-12-08 DIAGNOSIS — R739 Hyperglycemia, unspecified: Secondary | ICD-10-CM | POA: Diagnosis not present

## 2017-12-08 DIAGNOSIS — E1022 Type 1 diabetes mellitus with diabetic chronic kidney disease: Secondary | ICD-10-CM | POA: Diagnosis not present

## 2017-12-08 DIAGNOSIS — N2581 Secondary hyperparathyroidism of renal origin: Secondary | ICD-10-CM | POA: Diagnosis not present

## 2017-12-09 ENCOUNTER — Ambulatory Visit (INDEPENDENT_AMBULATORY_CARE_PROVIDER_SITE_OTHER): Payer: Medicare Other | Admitting: Podiatry

## 2017-12-09 DIAGNOSIS — L97512 Non-pressure chronic ulcer of other part of right foot with fat layer exposed: Secondary | ICD-10-CM | POA: Diagnosis not present

## 2017-12-09 NOTE — Progress Notes (Signed)
  Subjective:  Patient ID: [name], male    DOB: Jan 18, 1981,  MRN: [id number]  No chief complaint on file.  37 y.o. male returns for wound care. Believes the wound to be doing ok. States that his [name] was approved and he is awaiting receiving it. Denies N/V/F/Ch.  Objective:  There were no vitals filed for this visit. General AA&O x3. Normal mood and affect.  Vascular Foot warm to touch.  Neurologic Sensation grossly diminished.  Dermatologic (Wound) Wound Location: R 1st interspace Wound Measurement: 5x3x0.3 Wound Base: Mixed Granular/Fibrotic Peri-wound: Calloused Exudate: None: wound tissue dry  No exposed bone present.  R 5th MPJ superficial ulceration with wholly granular base.  No warmth or erythema of either wound. No drainage. No ascending cellulitis. Wound progress: Improved since last check.  Orthopedic: No pain to palpation either foot.   Assessment & Plan:  Patient was evaluated and treated and all questions answered.  Ulcer R 1st MPJ -Debridement as below. -Dressed with medihoney, DSD. -HHC to use Santyl WTD dressings. -Again discussed operative debridement and wound VAC application. Patient not amenable to it. Will revisit if enzymatic debridement is not sufficient to debride wound.  Procedure: Excisional Debridement of Wound Rationale: Removal of non-viable soft tissue from the wound to promote healing.  Anesthesia: none Pre-Debridement Wound Measurements: 5 cm x 3 cm x 0.3 cm  Post-Debridement Wound Measurements: 5 cm x 3 cm x 0.3 cm  Type of Debridement: Excisional Tissue Removed: Non-viable soft tissue Depth of Debridement: subq Instrumentation: 312 blade, tissue nipper Technique: Sharp excisional debridement to bleeding, viable wound base.  Dressing: Dry, sterile, compression dressing. Disposition: Patient tolerated procedure well. Patient to return in 1 week for follow-up.  Return in about 1 week (around 12/09/2017) for Wound Care.

## 2017-12-10 DIAGNOSIS — E10621 Type 1 diabetes mellitus with foot ulcer: Secondary | ICD-10-CM | POA: Diagnosis not present

## 2017-12-10 DIAGNOSIS — D509 Iron deficiency anemia, unspecified: Secondary | ICD-10-CM | POA: Diagnosis not present

## 2017-12-10 DIAGNOSIS — L97512 Non-pressure chronic ulcer of other part of right foot with fat layer exposed: Secondary | ICD-10-CM | POA: Diagnosis not present

## 2017-12-10 DIAGNOSIS — R739 Hyperglycemia, unspecified: Secondary | ICD-10-CM | POA: Diagnosis not present

## 2017-12-10 DIAGNOSIS — E1022 Type 1 diabetes mellitus with diabetic chronic kidney disease: Secondary | ICD-10-CM | POA: Diagnosis not present

## 2017-12-10 DIAGNOSIS — N186 End stage renal disease: Secondary | ICD-10-CM | POA: Diagnosis not present

## 2017-12-10 DIAGNOSIS — N2581 Secondary hyperparathyroidism of renal origin: Secondary | ICD-10-CM | POA: Diagnosis not present

## 2017-12-10 DIAGNOSIS — B957 Other staphylococcus as the cause of diseases classified elsewhere: Secondary | ICD-10-CM | POA: Diagnosis not present

## 2017-12-10 DIAGNOSIS — L03115 Cellulitis of right lower limb: Secondary | ICD-10-CM | POA: Diagnosis not present

## 2017-12-10 DIAGNOSIS — L97511 Non-pressure chronic ulcer of other part of right foot limited to breakdown of skin: Secondary | ICD-10-CM | POA: Diagnosis not present

## 2017-12-13 DIAGNOSIS — E10621 Type 1 diabetes mellitus with foot ulcer: Secondary | ICD-10-CM | POA: Diagnosis not present

## 2017-12-13 DIAGNOSIS — L03115 Cellulitis of right lower limb: Secondary | ICD-10-CM | POA: Diagnosis not present

## 2017-12-13 DIAGNOSIS — N186 End stage renal disease: Secondary | ICD-10-CM | POA: Diagnosis not present

## 2017-12-13 DIAGNOSIS — E1022 Type 1 diabetes mellitus with diabetic chronic kidney disease: Secondary | ICD-10-CM | POA: Diagnosis not present

## 2017-12-13 DIAGNOSIS — N2581 Secondary hyperparathyroidism of renal origin: Secondary | ICD-10-CM | POA: Diagnosis not present

## 2017-12-13 DIAGNOSIS — R739 Hyperglycemia, unspecified: Secondary | ICD-10-CM | POA: Diagnosis not present

## 2017-12-13 DIAGNOSIS — L97512 Non-pressure chronic ulcer of other part of right foot with fat layer exposed: Secondary | ICD-10-CM | POA: Diagnosis not present

## 2017-12-13 DIAGNOSIS — D509 Iron deficiency anemia, unspecified: Secondary | ICD-10-CM | POA: Diagnosis not present

## 2017-12-13 DIAGNOSIS — L97511 Non-pressure chronic ulcer of other part of right foot limited to breakdown of skin: Secondary | ICD-10-CM | POA: Diagnosis not present

## 2017-12-13 DIAGNOSIS — B957 Other staphylococcus as the cause of diseases classified elsewhere: Secondary | ICD-10-CM | POA: Diagnosis not present

## 2017-12-15 ENCOUNTER — Encounter: Payer: Self-pay | Admitting: Podiatry

## 2017-12-15 ENCOUNTER — Telehealth: Payer: Self-pay | Admitting: *Deleted

## 2017-12-15 ENCOUNTER — Ambulatory Visit (INDEPENDENT_AMBULATORY_CARE_PROVIDER_SITE_OTHER): Payer: Medicare Other | Admitting: Podiatry

## 2017-12-15 VITALS — BP 89/72 | HR 53 | Temp 98.6°F | Resp 16

## 2017-12-15 DIAGNOSIS — I739 Peripheral vascular disease, unspecified: Secondary | ICD-10-CM

## 2017-12-15 DIAGNOSIS — R739 Hyperglycemia, unspecified: Secondary | ICD-10-CM | POA: Diagnosis not present

## 2017-12-15 DIAGNOSIS — N186 End stage renal disease: Secondary | ICD-10-CM | POA: Diagnosis not present

## 2017-12-15 DIAGNOSIS — M86671 Other chronic osteomyelitis, right ankle and foot: Secondary | ICD-10-CM

## 2017-12-15 DIAGNOSIS — E1022 Type 1 diabetes mellitus with diabetic chronic kidney disease: Secondary | ICD-10-CM | POA: Diagnosis not present

## 2017-12-15 DIAGNOSIS — N2581 Secondary hyperparathyroidism of renal origin: Secondary | ICD-10-CM | POA: Diagnosis not present

## 2017-12-15 DIAGNOSIS — D509 Iron deficiency anemia, unspecified: Secondary | ICD-10-CM | POA: Diagnosis not present

## 2017-12-15 NOTE — Telephone Encounter (Signed)
[facility] states pt is there and Dr.Patel has noticed a very foul odor from his surgery foot. I asked if Dr. March Rummage could call Dr. Posey Pronto or if I could get pt scheduled to come in to see a doctor today. Cindy states Dr. Posey Pronto would like pt to see a doctor today and I transferred her to schedulers to assist in scheduling pt with Dr. Paulla Dolly.

## 2017-12-16 ENCOUNTER — Ambulatory Visit (INDEPENDENT_AMBULATORY_CARE_PROVIDER_SITE_OTHER): Payer: Medicare Other | Admitting: Podiatry

## 2017-12-16 ENCOUNTER — Ambulatory Visit: Payer: Medicare Other | Admitting: Podiatry

## 2017-12-16 DIAGNOSIS — L97512 Non-pressure chronic ulcer of other part of right foot with fat layer exposed: Secondary | ICD-10-CM | POA: Diagnosis not present

## 2017-12-16 NOTE — Progress Notes (Signed)
Subjective:   Patient ID: [name], male   DOB: 37 y.o.   MRN: [id number]   HPI Patient states his foot does not hurt but they were concerned about odor and he is due to see Dr. March Rummage tomorrow but we just wanted to look at this   ROS      Objective:  Physical Exam  No other changes in neurovascular or overall health with severe breakdown of tissue in the plantar distal aspect of the right foot where this patient has chronic ulceration and is in very poor health with end-stage kidney disease     Assessment:  Significant ulceration of the right forefoot which is most likely can require amputation with no proximal indications of infection at this time     Plan:  He has been taking care of regular by Dr. March Rummage will see him back in 12 hours and at this point we did go ahead and applied sterile dressing and did not see any cellulitic event or indications of systemic infection

## 2017-12-17 ENCOUNTER — Telehealth: Payer: Self-pay | Admitting: *Deleted

## 2017-12-17 DIAGNOSIS — E1022 Type 1 diabetes mellitus with diabetic chronic kidney disease: Secondary | ICD-10-CM | POA: Diagnosis not present

## 2017-12-17 DIAGNOSIS — L03115 Cellulitis of right lower limb: Secondary | ICD-10-CM | POA: Diagnosis not present

## 2017-12-17 DIAGNOSIS — N2581 Secondary hyperparathyroidism of renal origin: Secondary | ICD-10-CM | POA: Diagnosis not present

## 2017-12-17 DIAGNOSIS — B957 Other staphylococcus as the cause of diseases classified elsewhere: Secondary | ICD-10-CM | POA: Diagnosis not present

## 2017-12-17 DIAGNOSIS — R739 Hyperglycemia, unspecified: Secondary | ICD-10-CM | POA: Diagnosis not present

## 2017-12-17 DIAGNOSIS — E10621 Type 1 diabetes mellitus with foot ulcer: Secondary | ICD-10-CM | POA: Diagnosis not present

## 2017-12-17 DIAGNOSIS — N186 End stage renal disease: Secondary | ICD-10-CM | POA: Diagnosis not present

## 2017-12-17 DIAGNOSIS — L97511 Non-pressure chronic ulcer of other part of right foot limited to breakdown of skin: Secondary | ICD-10-CM | POA: Diagnosis not present

## 2017-12-17 DIAGNOSIS — D509 Iron deficiency anemia, unspecified: Secondary | ICD-10-CM | POA: Diagnosis not present

## 2017-12-17 DIAGNOSIS — L97512 Non-pressure chronic ulcer of other part of right foot with fat layer exposed: Secondary | ICD-10-CM | POA: Diagnosis not present

## 2017-12-17 NOTE — Telephone Encounter (Signed)
[name] states she would like an order to omit pt home visit when pt has seen [place] in office.

## 2017-12-17 NOTE — Telephone Encounter (Signed)
I informed [name] that would be fine.

## 2017-12-20 ENCOUNTER — Telehealth: Payer: Self-pay | Admitting: Podiatry

## 2017-12-20 DIAGNOSIS — E1022 Type 1 diabetes mellitus with diabetic chronic kidney disease: Secondary | ICD-10-CM | POA: Diagnosis not present

## 2017-12-20 DIAGNOSIS — E10621 Type 1 diabetes mellitus with foot ulcer: Secondary | ICD-10-CM | POA: Diagnosis not present

## 2017-12-20 DIAGNOSIS — R739 Hyperglycemia, unspecified: Secondary | ICD-10-CM | POA: Diagnosis not present

## 2017-12-20 DIAGNOSIS — L03115 Cellulitis of right lower limb: Secondary | ICD-10-CM | POA: Diagnosis not present

## 2017-12-20 DIAGNOSIS — L97512 Non-pressure chronic ulcer of other part of right foot with fat layer exposed: Secondary | ICD-10-CM | POA: Diagnosis not present

## 2017-12-20 DIAGNOSIS — N186 End stage renal disease: Secondary | ICD-10-CM | POA: Diagnosis not present

## 2017-12-20 DIAGNOSIS — D509 Iron deficiency anemia, unspecified: Secondary | ICD-10-CM | POA: Diagnosis not present

## 2017-12-20 DIAGNOSIS — B957 Other staphylococcus as the cause of diseases classified elsewhere: Secondary | ICD-10-CM | POA: Diagnosis not present

## 2017-12-20 DIAGNOSIS — L97511 Non-pressure chronic ulcer of other part of right foot limited to breakdown of skin: Secondary | ICD-10-CM | POA: Diagnosis not present

## 2017-12-20 DIAGNOSIS — N2581 Secondary hyperparathyroidism of renal origin: Secondary | ICD-10-CM | POA: Diagnosis not present

## 2017-12-20 MED ORDER — CLINDAMYCIN HCL 300 MG PO CAPS: 300.0000 mg | ORAL_CAPSULE | Freq: Two times a day (BID) | ORAL | 0 refills | Status: DC

## 2017-12-20 MED ORDER — CIPROFLOXACIN HCL 250 MG PO TABS: 250.0000 mg | ORAL_TABLET | Freq: Two times a day (BID) | ORAL | 0 refills | Status: DC

## 2017-12-20 NOTE — Telephone Encounter (Signed)
I informed pt of Dr. Eleanora Neighbor orders and encouraged pt to keep his appt on the 12/23/2017, and to call if he needed anything else. Pt states understanding.

## 2017-12-20 NOTE — Addendum Note (Signed)
Addended by: Harriett Sine D on: 12/20/2017 04:59 PM   Modules accepted: Orders

## 2017-12-20 NOTE — Telephone Encounter (Signed)
Dr. March Rummage states order clindamycin 300mg  #20 one tablet bid, and ciprofloxacin 250mg  #20 one tablet bid.

## 2017-12-20 NOTE — Telephone Encounter (Signed)
This is [place], RN with [facility]. I'm out here to do his wound care. The smell, the slight odor that he has gotten stronger and on the bandage it looks like its increased drainage. It is smelling up his house and smelling in his room. I don't know if he has an infection going on but the drainage and smell has gotten worse. If you could please give me a call at [phone].

## 2017-12-20 NOTE — Telephone Encounter (Signed)
Left message informing Dr. March Rummage with the report from [facility] updated home health visit.

## 2017-12-20 NOTE — Telephone Encounter (Signed)
Pt states he doesn't have the availability to go to the ED. I asked pt if he would be able to have someone to pick up a medication for him, and pt stated yes.

## 2017-12-22 DIAGNOSIS — R739 Hyperglycemia, unspecified: Secondary | ICD-10-CM | POA: Diagnosis not present

## 2017-12-22 DIAGNOSIS — E10621 Type 1 diabetes mellitus with foot ulcer: Secondary | ICD-10-CM | POA: Diagnosis not present

## 2017-12-22 DIAGNOSIS — N2581 Secondary hyperparathyroidism of renal origin: Secondary | ICD-10-CM | POA: Diagnosis not present

## 2017-12-22 DIAGNOSIS — B957 Other staphylococcus as the cause of diseases classified elsewhere: Secondary | ICD-10-CM | POA: Diagnosis not present

## 2017-12-22 DIAGNOSIS — D509 Iron deficiency anemia, unspecified: Secondary | ICD-10-CM | POA: Diagnosis not present

## 2017-12-22 DIAGNOSIS — N186 End stage renal disease: Secondary | ICD-10-CM | POA: Diagnosis not present

## 2017-12-22 DIAGNOSIS — E1022 Type 1 diabetes mellitus with diabetic chronic kidney disease: Secondary | ICD-10-CM | POA: Diagnosis not present

## 2017-12-22 DIAGNOSIS — L97512 Non-pressure chronic ulcer of other part of right foot with fat layer exposed: Secondary | ICD-10-CM | POA: Diagnosis not present

## 2017-12-22 DIAGNOSIS — L03115 Cellulitis of right lower limb: Secondary | ICD-10-CM | POA: Diagnosis not present

## 2017-12-22 DIAGNOSIS — L97511 Non-pressure chronic ulcer of other part of right foot limited to breakdown of skin: Secondary | ICD-10-CM | POA: Diagnosis not present

## 2017-12-23 ENCOUNTER — Ambulatory Visit (INDEPENDENT_AMBULATORY_CARE_PROVIDER_SITE_OTHER): Payer: Medicare Other | Admitting: Podiatry

## 2017-12-23 DIAGNOSIS — E08621 Diabetes mellitus due to underlying condition with foot ulcer: Secondary | ICD-10-CM

## 2017-12-23 DIAGNOSIS — L97411 Non-pressure chronic ulcer of right heel and midfoot limited to breakdown of skin: Secondary | ICD-10-CM | POA: Diagnosis not present

## 2017-12-23 NOTE — Progress Notes (Signed)
  Subjective:  Patient ID: [name], male    DOB: [birth year],  MRN: [id number]  No chief complaint on file.  37 y.o. male returns for wound care.  States that his dialysis center was concerned about the odor from the wound was concerned for infection.  States that his home health care nurse and him believe the wound to be improving.  Did pick up the antibiotics that was were ordered out of concern for infection  Objective:  There were no vitals filed for this visit. General AA&O x3. Normal mood and affect.  Vascular Foot warm to touch.  Neurologic Sensation grossly diminished.  Dermatologic (Wound) Wound Location: R 1st interspace Wound Measurement: 6x2x0.2 Wound Base: Mixed Granular/Fibrotic Peri-wound: Calloused Exudate: None: wound tissue dry  No exposed bone present.  R 5th MPJ superficial ulceration with wholly granular base.  No warmth or erythema of either wound. No drainage. No ascending cellulitis. Wound progress: Improved since last check.  Orthopedic: No pain to palpation either foot.   Assessment & Plan:  Patient was evaluated and treated and all questions answered.  Ulcer R 1st MPJ -Debridement as below. -Dressed with medihoney, DSD. -Continue sent with home health care. -Wound appears to be improving.  Will hold off on operative debridement and wound VAC at this time -Continue antibiotics to completion.  No signs of infection today.  Procedure: Excisional Debridement of Wound Rationale: Removal of non-viable soft tissue from the wound to promote healing.  Anesthesia: none Pre-Debridement Wound Measurements: 5 cm x 2 cm x 0.2 cm  Post-Debridement Wound Measurements: 6 cm x 2 cm x 0.2 cm  Type of Debridement: Excisional Tissue Removed: Non-viable soft tissue Depth of Debridement: subq Instrumentation: 312 blade, tissue nipper Technique: Sharp excisional debridement to bleeding, viable wound base.  Dressing: Dry, sterile, compression  dressing. Disposition: Patient tolerated procedure well. Patient to return in 1 week for follow-up.  Return in about 1 week (around 12/30/2017) for Wound Care.

## 2017-12-24 DIAGNOSIS — E1022 Type 1 diabetes mellitus with diabetic chronic kidney disease: Secondary | ICD-10-CM | POA: Diagnosis not present

## 2017-12-24 DIAGNOSIS — R739 Hyperglycemia, unspecified: Secondary | ICD-10-CM | POA: Diagnosis not present

## 2017-12-24 DIAGNOSIS — L97511 Non-pressure chronic ulcer of other part of right foot limited to breakdown of skin: Secondary | ICD-10-CM | POA: Diagnosis not present

## 2017-12-24 DIAGNOSIS — D509 Iron deficiency anemia, unspecified: Secondary | ICD-10-CM | POA: Diagnosis not present

## 2017-12-24 DIAGNOSIS — N2581 Secondary hyperparathyroidism of renal origin: Secondary | ICD-10-CM | POA: Diagnosis not present

## 2017-12-24 DIAGNOSIS — L03115 Cellulitis of right lower limb: Secondary | ICD-10-CM | POA: Diagnosis not present

## 2017-12-24 DIAGNOSIS — E10621 Type 1 diabetes mellitus with foot ulcer: Secondary | ICD-10-CM | POA: Diagnosis not present

## 2017-12-24 DIAGNOSIS — L97512 Non-pressure chronic ulcer of other part of right foot with fat layer exposed: Secondary | ICD-10-CM | POA: Diagnosis not present

## 2017-12-24 DIAGNOSIS — B957 Other staphylococcus as the cause of diseases classified elsewhere: Secondary | ICD-10-CM | POA: Diagnosis not present

## 2017-12-24 DIAGNOSIS — N186 End stage renal disease: Secondary | ICD-10-CM | POA: Diagnosis not present

## 2017-12-25 NOTE — Progress Notes (Signed)
  Subjective:  Patient ID: [name], male    DOB: 05-04-1981,  MRN: [id number]  No chief complaint on file.  37 y.o. male returns for wound car states his dialysis was concerned that he had an infection to his foot and he was seen by Dr. Paulla Dolly yesterday.  Denies nausea vomiting fever chills feeling okay's believes that the wound is improving.  Using Santyl.  Objective:  There were no vitals filed for this visit. General AA&O x3. Normal mood and affect.  Vascular Foot warm to touch.  Neurologic Sensation grossly diminished.  Dermatologic (Wound) Wound Location: R 1st interspace Wound Measurement:7*2 Wound Base: Mixed Granular/Fibrotic Peri-wound: Calloused Exudate: None: wound tissue dry    No exposed bone present.  R 5th MPJ superficial ulceration with wholly granular base.  No warmth or erythema of either wound. No drainage. No ascending cellulitis. Wound progress: Improved since last check.  Orthopedic: No pain to palpation either foot.   Assessment & Plan:  Patient was evaluated and treated and all questions answered.  Ulcer R 1st MPJ -Debridement as below. -Dressed with medihoney, DSD. -Continue Santyl wet-to-dry dressings with home health care  Procedure: Excisional Debridement of Wound Rationale: Removal of non-viable soft tissue from the wound to promote healing.  Anesthesia: none Pre-Debridement Wound Measurements: 7 cm x 2 cm x 0.3 cm  Post-Debridement Wound Measurements: 7 cm x 2 cm x 0.3 cm  Type of Debridement: Excisional Tissue Removed: Non-viable soft tissue Depth of Debridement: subq Instrumentation: 312 blade, tissue nipper Technique: Sharp excisional debridement to bleeding, viable wound base.  Dressing: Dry, sterile, compression dressing. Disposition: Patient tolerated procedure well. Patient to return in 1 week for follow-up.  Return in about 1 week (around 12/23/2017) for Wound Care.

## 2017-12-26 NOTE — Progress Notes (Signed)
  Subjective:  Patient ID: [name], male    DOB: [birth year],  MRN: [id number]  No chief complaint on file.  37 y.o. male returns for wound care.  Believes the wound is doing okay states that his home health nurse is using Santyl  Objective:  There were no vitals filed for this visit. General AA&O x3. Normal mood and affect.  Vascular Foot warm to touch.  Neurologic Sensation grossly diminished.  Dermatologic (Wound) Wound Location: R 1st interspace Wound Measurement: 5x3x0.3 Wound Base: Mixed Granular/Fibrotic Peri-wound: Calloused Exudate: None: wound tissue dry    No exposed bone present.  R 5th MPJ superficial ulceration with wholly granular base.  No warmth or erythema of either wound. No drainage. No ascending cellulitis. Wound progress: Improved since last check.  Orthopedic: No pain to palpation either foot.   Assessment & Plan:  Patient was evaluated and treated and all questions answered.  Ulcer R 1st MPJ -Debridement as below. -Dressed with medihoney, DSD. -HHC to use Santyl WTD dressings. -Wound seems to be improving with Santyl.  Will still revisit having to perform operative debridement and wound VAC application  Procedure: Excisional Debridement of Wound Rationale: Removal of non-viable soft tissue from the wound to promote healing.  Anesthesia: none Pre-Debridement Wound Measurements: 5 cm x 3 cm x 0.3 cm  Post-Debridement Wound Measurements: 5 cm x 3 cm x 0.3 cm  Type of Debridement: Excisional Tissue Removed: Non-viable soft tissue Depth of Debridement: subq Instrumentation: 312 blade, tissue nipper Technique: Sharp excisional debridement to bleeding, viable wound base.  Dressing: Dry, sterile, compression dressing. Disposition: Patient tolerated procedure well. Patient to return in 1 week for follow-up.  Return in about 1 week (around 12/16/2017) for Wound Care.

## 2017-12-27 DIAGNOSIS — E10621 Type 1 diabetes mellitus with foot ulcer: Secondary | ICD-10-CM | POA: Diagnosis not present

## 2017-12-27 DIAGNOSIS — R739 Hyperglycemia, unspecified: Secondary | ICD-10-CM | POA: Diagnosis not present

## 2017-12-27 DIAGNOSIS — N2581 Secondary hyperparathyroidism of renal origin: Secondary | ICD-10-CM | POA: Diagnosis not present

## 2017-12-27 DIAGNOSIS — L97512 Non-pressure chronic ulcer of other part of right foot with fat layer exposed: Secondary | ICD-10-CM | POA: Diagnosis not present

## 2017-12-27 DIAGNOSIS — E8770 Fluid overload, unspecified: Secondary | ICD-10-CM | POA: Diagnosis not present

## 2017-12-27 DIAGNOSIS — Z992 Dependence on renal dialysis: Secondary | ICD-10-CM | POA: Diagnosis not present

## 2017-12-27 DIAGNOSIS — D509 Iron deficiency anemia, unspecified: Secondary | ICD-10-CM | POA: Diagnosis not present

## 2017-12-27 DIAGNOSIS — I129 Hypertensive chronic kidney disease with stage 1 through stage 4 chronic kidney disease, or unspecified chronic kidney disease: Secondary | ICD-10-CM | POA: Diagnosis not present

## 2017-12-27 DIAGNOSIS — B957 Other staphylococcus as the cause of diseases classified elsewhere: Secondary | ICD-10-CM | POA: Diagnosis not present

## 2017-12-27 DIAGNOSIS — N186 End stage renal disease: Secondary | ICD-10-CM | POA: Diagnosis not present

## 2017-12-27 DIAGNOSIS — E1022 Type 1 diabetes mellitus with diabetic chronic kidney disease: Secondary | ICD-10-CM | POA: Diagnosis not present

## 2017-12-27 DIAGNOSIS — L97511 Non-pressure chronic ulcer of other part of right foot limited to breakdown of skin: Secondary | ICD-10-CM | POA: Diagnosis not present

## 2017-12-27 DIAGNOSIS — L03115 Cellulitis of right lower limb: Secondary | ICD-10-CM | POA: Diagnosis not present

## 2017-12-29 DIAGNOSIS — N186 End stage renal disease: Secondary | ICD-10-CM | POA: Diagnosis not present

## 2017-12-29 DIAGNOSIS — E8770 Fluid overload, unspecified: Secondary | ICD-10-CM | POA: Diagnosis not present

## 2017-12-29 DIAGNOSIS — L97512 Non-pressure chronic ulcer of other part of right foot with fat layer exposed: Secondary | ICD-10-CM | POA: Diagnosis not present

## 2017-12-29 DIAGNOSIS — L97511 Non-pressure chronic ulcer of other part of right foot limited to breakdown of skin: Secondary | ICD-10-CM | POA: Diagnosis not present

## 2017-12-29 DIAGNOSIS — L03115 Cellulitis of right lower limb: Secondary | ICD-10-CM | POA: Diagnosis not present

## 2017-12-29 DIAGNOSIS — D509 Iron deficiency anemia, unspecified: Secondary | ICD-10-CM | POA: Diagnosis not present

## 2017-12-29 DIAGNOSIS — E1022 Type 1 diabetes mellitus with diabetic chronic kidney disease: Secondary | ICD-10-CM | POA: Diagnosis not present

## 2017-12-29 DIAGNOSIS — N2581 Secondary hyperparathyroidism of renal origin: Secondary | ICD-10-CM | POA: Diagnosis not present

## 2017-12-29 DIAGNOSIS — B957 Other staphylococcus as the cause of diseases classified elsewhere: Secondary | ICD-10-CM | POA: Diagnosis not present

## 2017-12-29 DIAGNOSIS — E10621 Type 1 diabetes mellitus with foot ulcer: Secondary | ICD-10-CM | POA: Diagnosis not present

## 2017-12-29 DIAGNOSIS — R739 Hyperglycemia, unspecified: Secondary | ICD-10-CM | POA: Diagnosis not present

## 2017-12-31 ENCOUNTER — Ambulatory Visit (INDEPENDENT_AMBULATORY_CARE_PROVIDER_SITE_OTHER): Payer: Medicare Other | Admitting: Podiatry

## 2017-12-31 DIAGNOSIS — D509 Iron deficiency anemia, unspecified: Secondary | ICD-10-CM | POA: Diagnosis not present

## 2017-12-31 DIAGNOSIS — N2581 Secondary hyperparathyroidism of renal origin: Secondary | ICD-10-CM | POA: Diagnosis not present

## 2017-12-31 DIAGNOSIS — E08621 Diabetes mellitus due to underlying condition with foot ulcer: Secondary | ICD-10-CM

## 2017-12-31 DIAGNOSIS — L97411 Non-pressure chronic ulcer of right heel and midfoot limited to breakdown of skin: Secondary | ICD-10-CM

## 2017-12-31 DIAGNOSIS — R739 Hyperglycemia, unspecified: Secondary | ICD-10-CM | POA: Diagnosis not present

## 2017-12-31 DIAGNOSIS — E1022 Type 1 diabetes mellitus with diabetic chronic kidney disease: Secondary | ICD-10-CM | POA: Diagnosis not present

## 2017-12-31 DIAGNOSIS — E8770 Fluid overload, unspecified: Secondary | ICD-10-CM | POA: Diagnosis not present

## 2017-12-31 DIAGNOSIS — N186 End stage renal disease: Secondary | ICD-10-CM | POA: Diagnosis not present

## 2018-01-03 DIAGNOSIS — L03115 Cellulitis of right lower limb: Secondary | ICD-10-CM | POA: Diagnosis not present

## 2018-01-03 DIAGNOSIS — L97511 Non-pressure chronic ulcer of other part of right foot limited to breakdown of skin: Secondary | ICD-10-CM | POA: Diagnosis not present

## 2018-01-03 DIAGNOSIS — R739 Hyperglycemia, unspecified: Secondary | ICD-10-CM | POA: Diagnosis not present

## 2018-01-03 DIAGNOSIS — L97512 Non-pressure chronic ulcer of other part of right foot with fat layer exposed: Secondary | ICD-10-CM | POA: Diagnosis not present

## 2018-01-03 DIAGNOSIS — E1022 Type 1 diabetes mellitus with diabetic chronic kidney disease: Secondary | ICD-10-CM | POA: Diagnosis not present

## 2018-01-03 DIAGNOSIS — D509 Iron deficiency anemia, unspecified: Secondary | ICD-10-CM | POA: Diagnosis not present

## 2018-01-03 DIAGNOSIS — N186 End stage renal disease: Secondary | ICD-10-CM | POA: Diagnosis not present

## 2018-01-03 DIAGNOSIS — E10621 Type 1 diabetes mellitus with foot ulcer: Secondary | ICD-10-CM | POA: Diagnosis not present

## 2018-01-03 DIAGNOSIS — E8770 Fluid overload, unspecified: Secondary | ICD-10-CM | POA: Diagnosis not present

## 2018-01-03 DIAGNOSIS — N2581 Secondary hyperparathyroidism of renal origin: Secondary | ICD-10-CM | POA: Diagnosis not present

## 2018-01-03 DIAGNOSIS — B957 Other staphylococcus as the cause of diseases classified elsewhere: Secondary | ICD-10-CM | POA: Diagnosis not present

## 2018-01-04 DIAGNOSIS — Z992 Dependence on renal dialysis: Secondary | ICD-10-CM | POA: Diagnosis not present

## 2018-01-04 DIAGNOSIS — L03115 Cellulitis of right lower limb: Secondary | ICD-10-CM | POA: Diagnosis not present

## 2018-01-04 DIAGNOSIS — E10621 Type 1 diabetes mellitus with foot ulcer: Secondary | ICD-10-CM | POA: Diagnosis not present

## 2018-01-04 DIAGNOSIS — E1022 Type 1 diabetes mellitus with diabetic chronic kidney disease: Secondary | ICD-10-CM | POA: Diagnosis not present

## 2018-01-04 DIAGNOSIS — L97511 Non-pressure chronic ulcer of other part of right foot limited to breakdown of skin: Secondary | ICD-10-CM | POA: Diagnosis not present

## 2018-01-04 DIAGNOSIS — Z79891 Long term (current) use of opiate analgesic: Secondary | ICD-10-CM | POA: Diagnosis not present

## 2018-01-04 DIAGNOSIS — B957 Other staphylococcus as the cause of diseases classified elsewhere: Secondary | ICD-10-CM | POA: Diagnosis not present

## 2018-01-04 DIAGNOSIS — N186 End stage renal disease: Secondary | ICD-10-CM | POA: Diagnosis not present

## 2018-01-04 DIAGNOSIS — Z794 Long term (current) use of insulin: Secondary | ICD-10-CM | POA: Diagnosis not present

## 2018-01-04 DIAGNOSIS — L97411 Non-pressure chronic ulcer of right heel and midfoot limited to breakdown of skin: Secondary | ICD-10-CM | POA: Diagnosis not present

## 2018-01-04 DIAGNOSIS — K3184 Gastroparesis: Secondary | ICD-10-CM | POA: Diagnosis not present

## 2018-01-04 DIAGNOSIS — L97512 Non-pressure chronic ulcer of other part of right foot with fat layer exposed: Secondary | ICD-10-CM | POA: Diagnosis not present

## 2018-01-04 DIAGNOSIS — E1043 Type 1 diabetes mellitus with diabetic autonomic (poly)neuropathy: Secondary | ICD-10-CM | POA: Diagnosis not present

## 2018-01-05 DIAGNOSIS — E10621 Type 1 diabetes mellitus with foot ulcer: Secondary | ICD-10-CM | POA: Diagnosis not present

## 2018-01-05 DIAGNOSIS — B957 Other staphylococcus as the cause of diseases classified elsewhere: Secondary | ICD-10-CM | POA: Diagnosis not present

## 2018-01-05 DIAGNOSIS — L97511 Non-pressure chronic ulcer of other part of right foot limited to breakdown of skin: Secondary | ICD-10-CM | POA: Diagnosis not present

## 2018-01-05 DIAGNOSIS — R739 Hyperglycemia, unspecified: Secondary | ICD-10-CM | POA: Diagnosis not present

## 2018-01-05 DIAGNOSIS — E8770 Fluid overload, unspecified: Secondary | ICD-10-CM | POA: Diagnosis not present

## 2018-01-05 DIAGNOSIS — L97411 Non-pressure chronic ulcer of right heel and midfoot limited to breakdown of skin: Secondary | ICD-10-CM | POA: Diagnosis not present

## 2018-01-05 DIAGNOSIS — N2581 Secondary hyperparathyroidism of renal origin: Secondary | ICD-10-CM | POA: Diagnosis not present

## 2018-01-05 DIAGNOSIS — L97512 Non-pressure chronic ulcer of other part of right foot with fat layer exposed: Secondary | ICD-10-CM | POA: Diagnosis not present

## 2018-01-05 DIAGNOSIS — E1129 Type 2 diabetes mellitus with other diabetic kidney complication: Secondary | ICD-10-CM | POA: Diagnosis not present

## 2018-01-05 DIAGNOSIS — E1022 Type 1 diabetes mellitus with diabetic chronic kidney disease: Secondary | ICD-10-CM | POA: Diagnosis not present

## 2018-01-05 DIAGNOSIS — L03115 Cellulitis of right lower limb: Secondary | ICD-10-CM | POA: Diagnosis not present

## 2018-01-05 DIAGNOSIS — N186 End stage renal disease: Secondary | ICD-10-CM | POA: Diagnosis not present

## 2018-01-05 DIAGNOSIS — D509 Iron deficiency anemia, unspecified: Secondary | ICD-10-CM | POA: Diagnosis not present

## 2018-01-06 ENCOUNTER — Ambulatory Visit (INDEPENDENT_AMBULATORY_CARE_PROVIDER_SITE_OTHER): Payer: Medicare Other | Admitting: Podiatry

## 2018-01-06 DIAGNOSIS — L97411 Non-pressure chronic ulcer of right heel and midfoot limited to breakdown of skin: Secondary | ICD-10-CM

## 2018-01-06 DIAGNOSIS — E08621 Diabetes mellitus due to underlying condition with foot ulcer: Secondary | ICD-10-CM

## 2018-01-07 DIAGNOSIS — R739 Hyperglycemia, unspecified: Secondary | ICD-10-CM | POA: Diagnosis not present

## 2018-01-07 DIAGNOSIS — E8770 Fluid overload, unspecified: Secondary | ICD-10-CM | POA: Diagnosis not present

## 2018-01-07 DIAGNOSIS — L03115 Cellulitis of right lower limb: Secondary | ICD-10-CM | POA: Diagnosis not present

## 2018-01-07 DIAGNOSIS — D509 Iron deficiency anemia, unspecified: Secondary | ICD-10-CM | POA: Diagnosis not present

## 2018-01-07 DIAGNOSIS — L97411 Non-pressure chronic ulcer of right heel and midfoot limited to breakdown of skin: Secondary | ICD-10-CM | POA: Diagnosis not present

## 2018-01-07 DIAGNOSIS — N2581 Secondary hyperparathyroidism of renal origin: Secondary | ICD-10-CM | POA: Diagnosis not present

## 2018-01-07 DIAGNOSIS — L97512 Non-pressure chronic ulcer of other part of right foot with fat layer exposed: Secondary | ICD-10-CM | POA: Diagnosis not present

## 2018-01-07 DIAGNOSIS — N186 End stage renal disease: Secondary | ICD-10-CM | POA: Diagnosis not present

## 2018-01-07 DIAGNOSIS — L97511 Non-pressure chronic ulcer of other part of right foot limited to breakdown of skin: Secondary | ICD-10-CM | POA: Diagnosis not present

## 2018-01-07 DIAGNOSIS — B957 Other staphylococcus as the cause of diseases classified elsewhere: Secondary | ICD-10-CM | POA: Diagnosis not present

## 2018-01-07 DIAGNOSIS — E10621 Type 1 diabetes mellitus with foot ulcer: Secondary | ICD-10-CM | POA: Diagnosis not present

## 2018-01-07 DIAGNOSIS — E1022 Type 1 diabetes mellitus with diabetic chronic kidney disease: Secondary | ICD-10-CM | POA: Diagnosis not present

## 2018-01-10 DIAGNOSIS — D509 Iron deficiency anemia, unspecified: Secondary | ICD-10-CM | POA: Diagnosis not present

## 2018-01-10 DIAGNOSIS — B957 Other staphylococcus as the cause of diseases classified elsewhere: Secondary | ICD-10-CM | POA: Diagnosis not present

## 2018-01-10 DIAGNOSIS — L97511 Non-pressure chronic ulcer of other part of right foot limited to breakdown of skin: Secondary | ICD-10-CM | POA: Diagnosis not present

## 2018-01-10 DIAGNOSIS — E1022 Type 1 diabetes mellitus with diabetic chronic kidney disease: Secondary | ICD-10-CM | POA: Diagnosis not present

## 2018-01-10 DIAGNOSIS — N186 End stage renal disease: Secondary | ICD-10-CM | POA: Diagnosis not present

## 2018-01-10 DIAGNOSIS — L97512 Non-pressure chronic ulcer of other part of right foot with fat layer exposed: Secondary | ICD-10-CM | POA: Diagnosis not present

## 2018-01-10 DIAGNOSIS — L03115 Cellulitis of right lower limb: Secondary | ICD-10-CM | POA: Diagnosis not present

## 2018-01-10 DIAGNOSIS — E8770 Fluid overload, unspecified: Secondary | ICD-10-CM | POA: Diagnosis not present

## 2018-01-10 DIAGNOSIS — N2581 Secondary hyperparathyroidism of renal origin: Secondary | ICD-10-CM | POA: Diagnosis not present

## 2018-01-10 DIAGNOSIS — R739 Hyperglycemia, unspecified: Secondary | ICD-10-CM | POA: Diagnosis not present

## 2018-01-10 DIAGNOSIS — E10621 Type 1 diabetes mellitus with foot ulcer: Secondary | ICD-10-CM | POA: Diagnosis not present

## 2018-01-10 DIAGNOSIS — L97411 Non-pressure chronic ulcer of right heel and midfoot limited to breakdown of skin: Secondary | ICD-10-CM | POA: Diagnosis not present

## 2018-01-12 DIAGNOSIS — L97511 Non-pressure chronic ulcer of other part of right foot limited to breakdown of skin: Secondary | ICD-10-CM | POA: Diagnosis not present

## 2018-01-12 DIAGNOSIS — L03115 Cellulitis of right lower limb: Secondary | ICD-10-CM | POA: Diagnosis not present

## 2018-01-12 DIAGNOSIS — B957 Other staphylococcus as the cause of diseases classified elsewhere: Secondary | ICD-10-CM | POA: Diagnosis not present

## 2018-01-12 DIAGNOSIS — L97512 Non-pressure chronic ulcer of other part of right foot with fat layer exposed: Secondary | ICD-10-CM | POA: Diagnosis not present

## 2018-01-12 DIAGNOSIS — R739 Hyperglycemia, unspecified: Secondary | ICD-10-CM | POA: Diagnosis not present

## 2018-01-12 DIAGNOSIS — L97411 Non-pressure chronic ulcer of right heel and midfoot limited to breakdown of skin: Secondary | ICD-10-CM | POA: Diagnosis not present

## 2018-01-12 DIAGNOSIS — E8770 Fluid overload, unspecified: Secondary | ICD-10-CM | POA: Diagnosis not present

## 2018-01-12 DIAGNOSIS — D509 Iron deficiency anemia, unspecified: Secondary | ICD-10-CM | POA: Diagnosis not present

## 2018-01-12 DIAGNOSIS — N186 End stage renal disease: Secondary | ICD-10-CM | POA: Diagnosis not present

## 2018-01-12 DIAGNOSIS — E1022 Type 1 diabetes mellitus with diabetic chronic kidney disease: Secondary | ICD-10-CM | POA: Diagnosis not present

## 2018-01-12 DIAGNOSIS — E10621 Type 1 diabetes mellitus with foot ulcer: Secondary | ICD-10-CM | POA: Diagnosis not present

## 2018-01-12 DIAGNOSIS — N2581 Secondary hyperparathyroidism of renal origin: Secondary | ICD-10-CM | POA: Diagnosis not present

## 2018-01-13 ENCOUNTER — Encounter: Payer: Self-pay | Admitting: Podiatry

## 2018-01-13 ENCOUNTER — Ambulatory Visit (INDEPENDENT_AMBULATORY_CARE_PROVIDER_SITE_OTHER): Payer: Medicare Other | Admitting: Podiatry

## 2018-01-13 ENCOUNTER — Ambulatory Visit (INDEPENDENT_AMBULATORY_CARE_PROVIDER_SITE_OTHER): Payer: Medicare Other

## 2018-01-13 DIAGNOSIS — E08621 Diabetes mellitus due to underlying condition with foot ulcer: Secondary | ICD-10-CM

## 2018-01-13 DIAGNOSIS — L97411 Non-pressure chronic ulcer of right heel and midfoot limited to breakdown of skin: Secondary | ICD-10-CM | POA: Diagnosis not present

## 2018-01-14 DIAGNOSIS — E10621 Type 1 diabetes mellitus with foot ulcer: Secondary | ICD-10-CM | POA: Diagnosis not present

## 2018-01-14 DIAGNOSIS — L97512 Non-pressure chronic ulcer of other part of right foot with fat layer exposed: Secondary | ICD-10-CM | POA: Diagnosis not present

## 2018-01-14 DIAGNOSIS — L03115 Cellulitis of right lower limb: Secondary | ICD-10-CM | POA: Diagnosis not present

## 2018-01-14 DIAGNOSIS — E8770 Fluid overload, unspecified: Secondary | ICD-10-CM | POA: Diagnosis not present

## 2018-01-14 DIAGNOSIS — N186 End stage renal disease: Secondary | ICD-10-CM | POA: Diagnosis not present

## 2018-01-14 DIAGNOSIS — R739 Hyperglycemia, unspecified: Secondary | ICD-10-CM | POA: Diagnosis not present

## 2018-01-14 DIAGNOSIS — L97411 Non-pressure chronic ulcer of right heel and midfoot limited to breakdown of skin: Secondary | ICD-10-CM | POA: Diagnosis not present

## 2018-01-14 DIAGNOSIS — D509 Iron deficiency anemia, unspecified: Secondary | ICD-10-CM | POA: Diagnosis not present

## 2018-01-14 DIAGNOSIS — B957 Other staphylococcus as the cause of diseases classified elsewhere: Secondary | ICD-10-CM | POA: Diagnosis not present

## 2018-01-14 DIAGNOSIS — N2581 Secondary hyperparathyroidism of renal origin: Secondary | ICD-10-CM | POA: Diagnosis not present

## 2018-01-14 DIAGNOSIS — L97511 Non-pressure chronic ulcer of other part of right foot limited to breakdown of skin: Secondary | ICD-10-CM | POA: Diagnosis not present

## 2018-01-14 DIAGNOSIS — E1022 Type 1 diabetes mellitus with diabetic chronic kidney disease: Secondary | ICD-10-CM | POA: Diagnosis not present

## 2018-01-17 DIAGNOSIS — N2581 Secondary hyperparathyroidism of renal origin: Secondary | ICD-10-CM | POA: Diagnosis not present

## 2018-01-17 DIAGNOSIS — E1022 Type 1 diabetes mellitus with diabetic chronic kidney disease: Secondary | ICD-10-CM | POA: Diagnosis not present

## 2018-01-17 DIAGNOSIS — E8770 Fluid overload, unspecified: Secondary | ICD-10-CM | POA: Diagnosis not present

## 2018-01-17 DIAGNOSIS — L03115 Cellulitis of right lower limb: Secondary | ICD-10-CM | POA: Diagnosis not present

## 2018-01-17 DIAGNOSIS — L97411 Non-pressure chronic ulcer of right heel and midfoot limited to breakdown of skin: Secondary | ICD-10-CM | POA: Diagnosis not present

## 2018-01-17 DIAGNOSIS — R739 Hyperglycemia, unspecified: Secondary | ICD-10-CM | POA: Diagnosis not present

## 2018-01-17 DIAGNOSIS — L97512 Non-pressure chronic ulcer of other part of right foot with fat layer exposed: Secondary | ICD-10-CM | POA: Diagnosis not present

## 2018-01-17 DIAGNOSIS — B957 Other staphylococcus as the cause of diseases classified elsewhere: Secondary | ICD-10-CM | POA: Diagnosis not present

## 2018-01-17 DIAGNOSIS — N186 End stage renal disease: Secondary | ICD-10-CM | POA: Diagnosis not present

## 2018-01-17 DIAGNOSIS — L97511 Non-pressure chronic ulcer of other part of right foot limited to breakdown of skin: Secondary | ICD-10-CM | POA: Diagnosis not present

## 2018-01-17 DIAGNOSIS — D509 Iron deficiency anemia, unspecified: Secondary | ICD-10-CM | POA: Diagnosis not present

## 2018-01-17 DIAGNOSIS — E10621 Type 1 diabetes mellitus with foot ulcer: Secondary | ICD-10-CM | POA: Diagnosis not present

## 2018-01-19 DIAGNOSIS — L97512 Non-pressure chronic ulcer of other part of right foot with fat layer exposed: Secondary | ICD-10-CM | POA: Diagnosis not present

## 2018-01-19 DIAGNOSIS — N186 End stage renal disease: Secondary | ICD-10-CM | POA: Diagnosis not present

## 2018-01-19 DIAGNOSIS — R739 Hyperglycemia, unspecified: Secondary | ICD-10-CM | POA: Diagnosis not present

## 2018-01-19 DIAGNOSIS — E10621 Type 1 diabetes mellitus with foot ulcer: Secondary | ICD-10-CM | POA: Diagnosis not present

## 2018-01-19 DIAGNOSIS — L97511 Non-pressure chronic ulcer of other part of right foot limited to breakdown of skin: Secondary | ICD-10-CM | POA: Diagnosis not present

## 2018-01-19 DIAGNOSIS — E8770 Fluid overload, unspecified: Secondary | ICD-10-CM | POA: Diagnosis not present

## 2018-01-19 DIAGNOSIS — L97411 Non-pressure chronic ulcer of right heel and midfoot limited to breakdown of skin: Secondary | ICD-10-CM | POA: Diagnosis not present

## 2018-01-19 DIAGNOSIS — B957 Other staphylococcus as the cause of diseases classified elsewhere: Secondary | ICD-10-CM | POA: Diagnosis not present

## 2018-01-19 DIAGNOSIS — N2581 Secondary hyperparathyroidism of renal origin: Secondary | ICD-10-CM | POA: Diagnosis not present

## 2018-01-19 DIAGNOSIS — E1022 Type 1 diabetes mellitus with diabetic chronic kidney disease: Secondary | ICD-10-CM | POA: Diagnosis not present

## 2018-01-19 DIAGNOSIS — D509 Iron deficiency anemia, unspecified: Secondary | ICD-10-CM | POA: Diagnosis not present

## 2018-01-19 DIAGNOSIS — L03115 Cellulitis of right lower limb: Secondary | ICD-10-CM | POA: Diagnosis not present

## 2018-01-20 DIAGNOSIS — H31093 Other chorioretinal scars, bilateral: Secondary | ICD-10-CM | POA: Diagnosis not present

## 2018-01-20 DIAGNOSIS — H25043 Posterior subcapsular polar age-related cataract, bilateral: Secondary | ICD-10-CM | POA: Diagnosis not present

## 2018-01-20 DIAGNOSIS — E113511 Type 2 diabetes mellitus with proliferative diabetic retinopathy with macular edema, right eye: Secondary | ICD-10-CM | POA: Diagnosis not present

## 2018-01-20 DIAGNOSIS — E113512 Type 2 diabetes mellitus with proliferative diabetic retinopathy with macular edema, left eye: Secondary | ICD-10-CM | POA: Diagnosis not present

## 2018-01-20 DIAGNOSIS — H35372 Puckering of macula, left eye: Secondary | ICD-10-CM | POA: Diagnosis not present

## 2018-01-21 ENCOUNTER — Ambulatory Visit (INDEPENDENT_AMBULATORY_CARE_PROVIDER_SITE_OTHER): Payer: Medicare Other | Admitting: Podiatry

## 2018-01-21 DIAGNOSIS — L97411 Non-pressure chronic ulcer of right heel and midfoot limited to breakdown of skin: Secondary | ICD-10-CM | POA: Diagnosis not present

## 2018-01-21 DIAGNOSIS — R739 Hyperglycemia, unspecified: Secondary | ICD-10-CM | POA: Diagnosis not present

## 2018-01-21 DIAGNOSIS — E08621 Diabetes mellitus due to underlying condition with foot ulcer: Secondary | ICD-10-CM

## 2018-01-21 DIAGNOSIS — E1022 Type 1 diabetes mellitus with diabetic chronic kidney disease: Secondary | ICD-10-CM | POA: Diagnosis not present

## 2018-01-21 DIAGNOSIS — E8770 Fluid overload, unspecified: Secondary | ICD-10-CM | POA: Diagnosis not present

## 2018-01-21 DIAGNOSIS — L97512 Non-pressure chronic ulcer of other part of right foot with fat layer exposed: Secondary | ICD-10-CM | POA: Diagnosis not present

## 2018-01-21 DIAGNOSIS — N186 End stage renal disease: Secondary | ICD-10-CM | POA: Diagnosis not present

## 2018-01-21 DIAGNOSIS — N2581 Secondary hyperparathyroidism of renal origin: Secondary | ICD-10-CM | POA: Diagnosis not present

## 2018-01-21 DIAGNOSIS — D509 Iron deficiency anemia, unspecified: Secondary | ICD-10-CM | POA: Diagnosis not present

## 2018-01-22 DIAGNOSIS — R739 Hyperglycemia, unspecified: Secondary | ICD-10-CM | POA: Diagnosis not present

## 2018-01-22 DIAGNOSIS — N186 End stage renal disease: Secondary | ICD-10-CM | POA: Diagnosis not present

## 2018-01-22 DIAGNOSIS — E1022 Type 1 diabetes mellitus with diabetic chronic kidney disease: Secondary | ICD-10-CM | POA: Diagnosis not present

## 2018-01-22 DIAGNOSIS — E8770 Fluid overload, unspecified: Secondary | ICD-10-CM | POA: Diagnosis not present

## 2018-01-22 DIAGNOSIS — N2581 Secondary hyperparathyroidism of renal origin: Secondary | ICD-10-CM | POA: Diagnosis not present

## 2018-01-22 DIAGNOSIS — D509 Iron deficiency anemia, unspecified: Secondary | ICD-10-CM | POA: Diagnosis not present

## 2018-01-23 NOTE — Progress Notes (Signed)
  Subjective:  Patient ID: [name], male    DOB: 08-04-1981,  MRN: [id number]  No chief complaint on file.  37 y.o. male returns for wound care.  No new complaints. Denies N/V/F/CH.  Objective:  There were no vitals filed for this visit. General AA&O x3. Normal mood and affect.  Vascular Foot warm to touch.  Neurologic Sensation grossly diminished.  Dermatologic (Wound) Wound Location: R 1st interspace Wound Measurement: 5.5 x 1 post-debridement Wound Base: Mixed Granular/Fibrotic Peri-wound: Calloused Exudate: None: wound tissue dry    R 5th MPJ superficial ulceration with wholly granular base. 2.5x2  No warmth or erythema of either wound. No drainage. No ascending cellulitis. Wound progress: Improved since last check.  Orthopedic: No pain to palpation either foot.   Assessment & Plan:  Patient was evaluated and treated and all questions answered.  Ulcer R 1st MPJ -Debridement as below. -Dressed with medihoney, DSD. -Continue Santyl.  Procedure: Excisional Debridement of Wound Rationale: Removal of non-viable soft tissue from the wound to promote healing.  Anesthesia: none Pre-Debridement Wound Measurements: 5x1 x0.2 Post-Debridement Wound Measurements: 5.5 cm x 1 cm x 0.2 cm  Type of Debridement: Excisional Tissue Removed: Non-viable soft tissue Depth of Debridement: subq Instrumentation:312 blade, tissue nipper. Technique: Sharp excisional debridement to bleeding, viable wound base.  Dressing: Dry, sterile, compression dressing. Disposition: Patient tolerated procedure well. Patient to return in 1 week for follow-up.  Ulcer R 5thMPJ -Dressed with medihoney, DSD. -Continue Santyl.  Return in about 1 week (around 01/13/2018) for Wound Care.

## 2018-01-23 NOTE — Progress Notes (Signed)
  Subjective:  Patient ID: [name], male    DOB: 06/22/1981,  MRN: [id number]  No chief complaint on file.  37 y.o. male returns for wound care.  Wound improving. Denies issues. Denies N/V/F/Ch.  Objective:  There were no vitals filed for this visit. General AA&O x3. Normal mood and affect.  Vascular Foot warm to touch.  Neurologic Sensation grossly diminished.  Dermatologic (Wound) Wound Location: R 1st interspace Wound Measurement: 6x1.5x0.2 post-debridement Wound Base: Mixed Granular/Fibrotic Peri-wound: Calloused Exudate: None: wound tissue dry  No exposed bone present.  R 5th MPJ superficial ulceration with wholly granular base.  No warmth or erythema of either wound. No drainage. No ascending cellulitis. Wound progress: Improved since last check.  Orthopedic: No pain to palpation either foot.   Assessment & Plan:  Patient was evaluated and treated and all questions answered.  Ulcer R 1st MPJ -Debridement as below. -Dressed with medihoney, DSD. -Continue Santyl.  Procedure: Excisional Debridement of Wound Rationale: Removal of non-viable soft tissue from the wound to promote healing.  Anesthesia: none Pre-Debridement Wound Measurements: 6x1x0.2 Post-Debridement Wound Measurements: 6 cm x 1.5 cm x 0.2 cm  Type of Debridement: Excisional Tissue Removed: Non-viable soft tissue Depth of Debridement: subq Instrumentation: 312 blade, tissue nipper Technique: Sharp excisional debridement to bleeding, viable wound base.  Dressing: Dry, sterile, compression dressing. Disposition: Patient tolerated procedure well. Patient to return in 1 week for follow-up.  Return in about 1 week (around 01/07/2018).

## 2018-01-23 NOTE — Progress Notes (Signed)
  Subjective:  Patient ID: [name], male    DOB: [birth year],  MRN: [id number]  Chief Complaint  Patient presents with  . Wound Check    right diabetic ulcer   37 y.o. male returns for wound care. Believes the wound to be doing well. States he doesn't look at his wounds often.  Objective:  There were no vitals filed for this visit. General AA&O x3. Normal mood and affect.  Vascular Foot warm to touch.  Neurologic Sensation grossly diminished.  Dermatologic (Wound) Wound Location: R 1st interspace Wound Measurement: 5.5 x 1 post-debridement Wound Base: Mixed Granular/Fibrotic Peri-wound: Calloused Exudate: None: wound tissue dry  R 5th MPJ superficial ulceration with wholly granular base. 2.5x2.5 with exposed capsule. No probe to bone.  No warmth or erythema of either wound. No drainage. No ascending cellulitis. Wound progress: Improved since last check.  Orthopedic: No pain to palpation either foot.   Assessment & Plan:  Patient was evaluated and treated and all questions answered.  Ulcer R 1st MPJ -Debridement as below. -Dressed with medihoney, DSD. -Continue Santyl.  Procedure: Excisional Debridement of Wound Rationale: Removal of non-viable soft tissue from the wound to promote healing.  Anesthesia: none Pre-Debridement Wound Measurements: 5.5 cm x 1 cm x 0.2 cm  Post-Debridement Wound Measurements: 5.5 cm x 1.2 cm x 0.2 cm  Type of Debridement: Excisional Tissue Removed: Non-viable soft tissue Depth of Debridement: subq Instrumentation: 312 blade, tissue nipper. Technique: Sharp excisional debridement to bleeding, viable wound base.  Dressing: Dry, sterile, compression dressing. Disposition: Patient tolerated procedure well. Patient to return in 1 week for follow-up.     Ulcer R 5thMPJ -Worsening to capsule noted. -D/c Santyl at this area. Spoke to pts [place] nurse to inform of such. Dress with medihoney instead.  Return in about 1 week (around  01/20/2018).

## 2018-01-23 NOTE — Progress Notes (Signed)
  Subjective:  Patient ID: [name], male    DOB: Jan 20, 1981,  MRN: [id number]  Chief Complaint  Patient presents with  . Diabetic Ulcer    right foot - wound debride   37 y.o. male returns for wound care.  States the wound is continues to look better.  States they changed his home health care nurse.  Objective:  There were no vitals filed for this visit. General AA&O x3. Normal mood and affect.  Vascular Foot warm to touch.  Neurologic Sensation grossly diminished.  Dermatologic (Wound) Wound Location: R 1st interspace Wound Measurement: 5.5x0.5x0.2 Wound Base: Mixed Granular/Fibrotic Peri-wound: Calloused Exudate: None: wound tissue dry  R 5th MPJ ulceration 1x1 with exposed capsule but no exposed bone.  No warmth or erythema of either wound. No drainage. No ascending cellulitis. Wound progress: Improved since last check.  Orthopedic: No pain to palpation either foot.   Assessment & Plan:  Patient was evaluated and treated and all questions answered.  Ulcer R 1st MPJ -Debridement as below. -Dressed with medihoney, DSD. -Continue Santyl Application.  Procedure: Excisional Debridement of Wound Rationale: Removal of non-viable soft tissue from the wound to promote healing.  Anesthesia: none Pre-Debridement Wound Measurements: 5.5 cm x 0.5 cm x 0.2 cm  Post-Debridement Wound Measurements: 5.5 cm x 0.7 cm x 0.2 cm  Type of Debridement: Excisional Tissue Removed: Non-viable soft tissue Depth of Debridement: subq Instrumentation: 312 blade, tissue nipper Technique: Sharp excisional debridement to bleeding, viable wound base.  Dressing: Dry, sterile, compression dressing. Disposition: Patient tolerated procedure well. Patient to return in 1 week for follow-up.  Ulcer 5th MPJ R -Continued exposed capsule but no bone. -Dressed with medihoney and DSD. -Selective debridement as below.  Procedure: Selective Debridement of Wound Rationale: Removal of devitalized tissue  from the wound to promote healing.  Pre-Debridement Wound Measurements: 1 cm x 1 cm x 0.2 cm  Post-Debridement Wound Measurements: same as pre-debridement. Type of Debridement: Selective Tissue Removed: Devitalized soft-tissue Instrumentation: 312 blade. Dressing: Dry, sterile, compression dressing. Disposition: Patient tolerated procedure well. Patient to return in 1 week for follow-up.    Return in about 1 week (around 01/28/2018).

## 2018-01-24 DIAGNOSIS — E8770 Fluid overload, unspecified: Secondary | ICD-10-CM | POA: Diagnosis not present

## 2018-01-24 DIAGNOSIS — N186 End stage renal disease: Secondary | ICD-10-CM | POA: Diagnosis not present

## 2018-01-24 DIAGNOSIS — L97512 Non-pressure chronic ulcer of other part of right foot with fat layer exposed: Secondary | ICD-10-CM | POA: Diagnosis not present

## 2018-01-24 DIAGNOSIS — E10621 Type 1 diabetes mellitus with foot ulcer: Secondary | ICD-10-CM | POA: Diagnosis not present

## 2018-01-24 DIAGNOSIS — L97411 Non-pressure chronic ulcer of right heel and midfoot limited to breakdown of skin: Secondary | ICD-10-CM | POA: Diagnosis not present

## 2018-01-24 DIAGNOSIS — E1022 Type 1 diabetes mellitus with diabetic chronic kidney disease: Secondary | ICD-10-CM | POA: Diagnosis not present

## 2018-01-24 DIAGNOSIS — B957 Other staphylococcus as the cause of diseases classified elsewhere: Secondary | ICD-10-CM | POA: Diagnosis not present

## 2018-01-24 DIAGNOSIS — R739 Hyperglycemia, unspecified: Secondary | ICD-10-CM | POA: Diagnosis not present

## 2018-01-24 DIAGNOSIS — N2581 Secondary hyperparathyroidism of renal origin: Secondary | ICD-10-CM | POA: Diagnosis not present

## 2018-01-24 DIAGNOSIS — L97511 Non-pressure chronic ulcer of other part of right foot limited to breakdown of skin: Secondary | ICD-10-CM | POA: Diagnosis not present

## 2018-01-24 DIAGNOSIS — D509 Iron deficiency anemia, unspecified: Secondary | ICD-10-CM | POA: Diagnosis not present

## 2018-01-24 DIAGNOSIS — L03115 Cellulitis of right lower limb: Secondary | ICD-10-CM | POA: Diagnosis not present

## 2018-01-26 DIAGNOSIS — I129 Hypertensive chronic kidney disease with stage 1 through stage 4 chronic kidney disease, or unspecified chronic kidney disease: Secondary | ICD-10-CM | POA: Diagnosis not present

## 2018-01-26 DIAGNOSIS — D631 Anemia in chronic kidney disease: Secondary | ICD-10-CM | POA: Diagnosis not present

## 2018-01-26 DIAGNOSIS — L97512 Non-pressure chronic ulcer of other part of right foot with fat layer exposed: Secondary | ICD-10-CM | POA: Diagnosis not present

## 2018-01-26 DIAGNOSIS — E10621 Type 1 diabetes mellitus with foot ulcer: Secondary | ICD-10-CM | POA: Diagnosis not present

## 2018-01-26 DIAGNOSIS — L03115 Cellulitis of right lower limb: Secondary | ICD-10-CM | POA: Diagnosis not present

## 2018-01-26 DIAGNOSIS — D509 Iron deficiency anemia, unspecified: Secondary | ICD-10-CM | POA: Diagnosis not present

## 2018-01-26 DIAGNOSIS — E1022 Type 1 diabetes mellitus with diabetic chronic kidney disease: Secondary | ICD-10-CM | POA: Diagnosis not present

## 2018-01-26 DIAGNOSIS — N2581 Secondary hyperparathyroidism of renal origin: Secondary | ICD-10-CM | POA: Diagnosis not present

## 2018-01-26 DIAGNOSIS — L97411 Non-pressure chronic ulcer of right heel and midfoot limited to breakdown of skin: Secondary | ICD-10-CM | POA: Diagnosis not present

## 2018-01-26 DIAGNOSIS — M869 Osteomyelitis, unspecified: Secondary | ICD-10-CM | POA: Diagnosis not present

## 2018-01-26 DIAGNOSIS — N186 End stage renal disease: Secondary | ICD-10-CM | POA: Diagnosis not present

## 2018-01-26 DIAGNOSIS — Z992 Dependence on renal dialysis: Secondary | ICD-10-CM | POA: Diagnosis not present

## 2018-01-26 DIAGNOSIS — L97511 Non-pressure chronic ulcer of other part of right foot limited to breakdown of skin: Secondary | ICD-10-CM | POA: Diagnosis not present

## 2018-01-26 DIAGNOSIS — B957 Other staphylococcus as the cause of diseases classified elsewhere: Secondary | ICD-10-CM | POA: Diagnosis not present

## 2018-01-27 ENCOUNTER — Ambulatory Visit (INDEPENDENT_AMBULATORY_CARE_PROVIDER_SITE_OTHER): Payer: Medicare Other | Admitting: Podiatry

## 2018-01-27 ENCOUNTER — Telehealth: Payer: Self-pay | Admitting: Podiatry

## 2018-01-27 DIAGNOSIS — L97512 Non-pressure chronic ulcer of other part of right foot with fat layer exposed: Secondary | ICD-10-CM

## 2018-01-27 DIAGNOSIS — L97411 Non-pressure chronic ulcer of right heel and midfoot limited to breakdown of skin: Secondary | ICD-10-CM

## 2018-01-27 DIAGNOSIS — E08621 Diabetes mellitus due to underlying condition with foot ulcer: Secondary | ICD-10-CM

## 2018-01-27 MED ORDER — CIPROFLOXACIN HCL 250 MG PO TABS: 250.0000 mg | ORAL_TABLET | Freq: Two times a day (BID) | ORAL | 0 refills | Status: DC

## 2018-01-27 MED ORDER — CLINDAMYCIN HCL 300 MG PO CAPS: 300.0000 mg | ORAL_CAPSULE | Freq: Two times a day (BID) | ORAL | 0 refills | Status: DC

## 2018-01-27 NOTE — Telephone Encounter (Signed)
This is Corporate treasurer, Therapist, sports with [facility] [name] has been seen by [place] for quite some time for wound care three times a week. He is not homebound and the nurse has witnessed him driving up and he's just not homebound. We are going to need to discharge him and he has [employer]. Pt's are required to be homebound in order to receive home health. If you can please call me back because I'm going to have to call him and he is not going to be happy but we need to set up some alternate way for him to get his wound care. The number here is [phone]. Thank you very much.

## 2018-01-27 NOTE — Telephone Encounter (Signed)
Left message with Maxcine Ham to have Earleen Newport, RN to call concerning pt's [place].

## 2018-01-27 NOTE — Telephone Encounter (Addendum)
Dr. March Rummage ordered santyl to both wounds of feet to be applied 3 time a week by [facility]. Dr. March Rummage ordered pt to hospital and pt refused. Dr. March Rummage states he informed pt he was close to loosing home health care, because he was not remaining homeboound. This information and orders were faxed to [place]:  Earleen Newport, RN.

## 2018-01-27 NOTE — Telephone Encounter (Signed)
Hi, this is Corporate treasurer, Therapist, sports with [employer]. I received your fax regarding [name]. I just need clarification that we are allowed to discharge him. He is going to blow a gasket I'm sure but I just needed to, the way the fax is worded I'm not 100% sure if that's the case. Especially with him I want to make sure everything is clear. Please call me at [phone]. Thank you so much.

## 2018-01-27 NOTE — Progress Notes (Signed)
Subjective:  Patient ID: [name], male    DOB: 19-Mar-1981,  MRN: [id number]  Chief Complaint  Patient presents with  . Diabetic Ulcer    wound check right foot   37 y.o. male returns for wound care.  Believes the wound to be doing okay although he does not look at it.  Admits he has been driving more on the right foot.  Objective:  There were no vitals filed for this visit. General AA&O x3. Normal mood and affect.  Vascular Foot warm to touch.  Neurologic Sensation grossly diminished.  Dermatologic (Wound)  right first interspace ulceration healing well with hyperkeratotic borders no active drainage no malodor.  Measures approximately 3 x 0.5  Right fifth metatarsal head ulceration worsened this visit.  Exposed metatarsal head slightly gray in appearance no frank purulence.  Malodor noted.  Periwound necrosis noted.  No ascending cellulitis.   Orthopedic: No pain to palpation either foot.   Assessment & Plan:  Patient was evaluated and treated and all questions answered.  Ulcer R 1st MPJ -No debridement today. Improving. Continue Santyl. -Patient advised that he is discharged from home health care for not being homebound.  Attempted to re-initiate therapy under Medicaid because this does not require that he be homebound.  Patient's home health agency also expressed concern that he is very hostile to the nursing staff.  Ulcer 5th MPJ R -Wound has progressed to exposed bone today. -Wound dressed with betadine WTD dressing today. -Discussed with patient that due to the exposed bone he will need to undergo resection of the bone.  Discussed importance of doing this as soon as possible and getting antibiotics on board to prevent spread of infection.  Attempted to provide direct admission to the hospital for surgery tomorrow.  Discussed performing excision of the fifth metatarsal head and debridement of both foot wounds with possible application of a wound VAC. Patient states that he  cannot go today as he has things to take care of at home.  States that he may go tonight but would more likely go on Sunday night.  Again discussed that this is not my recommendation.  Patient also specifically stated that he did not want to receive a wound VAC because it is not conducive to his lifestyle. Patient told multiple times that he needs to go today and that by not doing so he is risking worsening of the wound and an increase in the risk of further loss of parts of his foot.  Patient seemed to not understand why he can just take oral antibiotics and follow-up on Monday.  Discussed the importance of removing the exposed bone to prevent rapid spread.  Discussed the better penetration of IV antibiotics and that he needs to have the surgery done as soon as possible.  Since patient would not go to the emergency room today oral antibiotics given.  During the course of our conversation I reinforced to the patient multiple times that my recommendation is for him to go the hospital today for care.  -Additionally patient also stated he did not want anyone other than me to perform surgery on his foot.  I specifically advised against this and stated that he should accept prompt care by whoever was on-call at the point where he goes to the hospital and to not delay care waiting for me on Monday.  I am concerned that due to patient's non-compliance he is at risk of further tissue loss than just an isolated fifth metatarsal head  excision that could be performed tomorrow were he to go to the hospital tonight. -On admission to the hospital patient would require lab work inclusive of CBC ESR CRP and x-rays and likely MRI to evaluate the viability of the fifth toe and fifth metatarsal.  He will need to undergo at a minimum wound debridement with excision of the fifth metatarsal head possible VAC application.  30 minutes of face to face time were spent with the patient. >50% of this was spent on counseling and  coordination of care. Specifically discussed with patient the above diagnoses and attempted to coordinate care.  Attempted to coordinate direct admission to the hospital however patient refused to go today.  No follow-ups on file.

## 2018-01-27 NOTE — Telephone Encounter (Signed)
Earleen Newport, RN states pt is not homebound and under Medicare guidelines he has to be homebound, but if Medicaid under Medicaid would not be required to be homebound and she will have to discharge pt and get him to sign a form to be treated under Medicaid guidelines. Under Medicaid guidelines he does not have to be homebound, and can be seen weekly for wound care check and instructed to perform his own wound care. Beth, RN states pt is so hostile and aggressive and inflexible, pt will leave or yell at the [facility] nurse if she is 10 minutes late, which often in the [facility] environment can not be helped. One nurse refuses his home health visits. I asked if she felt a male nurse would be a better fit for pt, because he does not address Dr. March Rummage as he addresses our male staff. Beth, RN states she will see if the male nurse will assist pt. Beth, RN states she will have a nurse go to pt tomorrow and discharge from Medicare and have him readmitted to their service under Medicaid guidelines.

## 2018-01-28 ENCOUNTER — Telehealth: Payer: Self-pay | Admitting: *Deleted

## 2018-01-28 DIAGNOSIS — B957 Other staphylococcus as the cause of diseases classified elsewhere: Secondary | ICD-10-CM | POA: Diagnosis not present

## 2018-01-28 DIAGNOSIS — D631 Anemia in chronic kidney disease: Secondary | ICD-10-CM | POA: Diagnosis not present

## 2018-01-28 DIAGNOSIS — E10621 Type 1 diabetes mellitus with foot ulcer: Secondary | ICD-10-CM | POA: Diagnosis not present

## 2018-01-28 DIAGNOSIS — N186 End stage renal disease: Secondary | ICD-10-CM | POA: Diagnosis not present

## 2018-01-28 DIAGNOSIS — D509 Iron deficiency anemia, unspecified: Secondary | ICD-10-CM | POA: Diagnosis not present

## 2018-01-28 DIAGNOSIS — M869 Osteomyelitis, unspecified: Secondary | ICD-10-CM | POA: Diagnosis not present

## 2018-01-28 DIAGNOSIS — L97511 Non-pressure chronic ulcer of other part of right foot limited to breakdown of skin: Secondary | ICD-10-CM | POA: Diagnosis not present

## 2018-01-28 DIAGNOSIS — L97512 Non-pressure chronic ulcer of other part of right foot with fat layer exposed: Secondary | ICD-10-CM | POA: Diagnosis not present

## 2018-01-28 DIAGNOSIS — E1022 Type 1 diabetes mellitus with diabetic chronic kidney disease: Secondary | ICD-10-CM | POA: Diagnosis not present

## 2018-01-28 DIAGNOSIS — N2581 Secondary hyperparathyroidism of renal origin: Secondary | ICD-10-CM | POA: Diagnosis not present

## 2018-01-28 DIAGNOSIS — L97411 Non-pressure chronic ulcer of right heel and midfoot limited to breakdown of skin: Secondary | ICD-10-CM | POA: Diagnosis not present

## 2018-01-28 DIAGNOSIS — L03115 Cellulitis of right lower limb: Secondary | ICD-10-CM | POA: Diagnosis not present

## 2018-01-28 NOTE — Telephone Encounter (Signed)
I spoke with pt and he states he will got to the hospital after dialysis on Monday, to have surgery. I told pt that he would not be able to have surgery right after checking into the ED. I told pt that he would need to tell the triage nurse that he needed to be seen by his podiatrist Dr. March Rummage, and then a referral would be sent to our office and Dr. March Rummage would see him and get the surgery started through the hospital surgical system. I told pt I would be working with Dr. March Rummage to get another [facility] agency. Pt states he is all prayered up and taking his antibiotic and everything would be fine. I told pt to take care of himself and we all just wanted him to get better.

## 2018-01-28 NOTE — Telephone Encounter (Signed)
Earleen Newport, RN - Nanine Means states pt was not aware santyl was to be used on both feet, and he is having surgery 01/31/2018, and will want a new [facility] agency, because he is dissatisfied with [facility].

## 2018-01-28 NOTE — Telephone Encounter (Signed)
I spoke with Lars Mage - [facility] and told her we had a pt that would be having surgery next week and would need home healthcare, his insurance was Medicare and Medicaid. Lars Mage states they do take those insurance and I told her I would get everything together for when pt is discharged.

## 2018-01-28 NOTE — Telephone Encounter (Signed)
[name], [place] states pt says he is going into the hospital after dialysis Monday.

## 2018-01-31 ENCOUNTER — Encounter (HOSPITAL_COMMUNITY): Payer: Self-pay

## 2018-01-31 ENCOUNTER — Telehealth: Payer: Self-pay | Admitting: *Deleted

## 2018-01-31 ENCOUNTER — Emergency Department (HOSPITAL_COMMUNITY): Payer: Medicare Other

## 2018-01-31 ENCOUNTER — Inpatient Hospital Stay (HOSPITAL_COMMUNITY)
Admission: EM | Admit: 2018-01-31 | Discharge: 2018-02-05 | DRG: 617 | Disposition: A | Discharge status: Home / Self Care | Payer: Medicare Other | Attending: Internal Medicine | Admitting: Internal Medicine

## 2018-01-31 DIAGNOSIS — M898X9 Other specified disorders of bone, unspecified site: Secondary | ICD-10-CM | POA: Diagnosis present

## 2018-01-31 DIAGNOSIS — E1065 Type 1 diabetes mellitus with hyperglycemia: Secondary | ICD-10-CM | POA: Diagnosis present

## 2018-01-31 DIAGNOSIS — R159 Full incontinence of feces: Secondary | ICD-10-CM | POA: Diagnosis present

## 2018-01-31 DIAGNOSIS — L089 Local infection of the skin and subcutaneous tissue, unspecified: Secondary | ICD-10-CM

## 2018-01-31 DIAGNOSIS — M84477A Pathological fracture, right toe(s), initial encounter for fracture: Secondary | ICD-10-CM | POA: Diagnosis not present

## 2018-01-31 DIAGNOSIS — I10 Essential (primary) hypertension: Secondary | ICD-10-CM | POA: Diagnosis present

## 2018-01-31 DIAGNOSIS — T82818A Embolism of vascular prosthetic devices, implants and grafts, initial encounter: Secondary | ICD-10-CM | POA: Diagnosis present

## 2018-01-31 DIAGNOSIS — E119 Type 2 diabetes mellitus without complications: Secondary | ICD-10-CM

## 2018-01-31 DIAGNOSIS — D631 Anemia in chronic kidney disease: Secondary | ICD-10-CM | POA: Diagnosis present

## 2018-01-31 DIAGNOSIS — T82868A Thrombosis of vascular prosthetic devices, implants and grafts, initial encounter: Secondary | ICD-10-CM

## 2018-01-31 DIAGNOSIS — N186 End stage renal disease: Secondary | ICD-10-CM | POA: Diagnosis present

## 2018-01-31 DIAGNOSIS — S92351A Displaced fracture of fifth metatarsal bone, right foot, initial encounter for closed fracture: Secondary | ICD-10-CM | POA: Diagnosis not present

## 2018-01-31 DIAGNOSIS — E1069 Type 1 diabetes mellitus with other specified complication: Principal | ICD-10-CM | POA: Diagnosis present

## 2018-01-31 DIAGNOSIS — D509 Iron deficiency anemia, unspecified: Secondary | ICD-10-CM | POA: Diagnosis not present

## 2018-01-31 DIAGNOSIS — K3184 Gastroparesis: Secondary | ICD-10-CM | POA: Diagnosis present

## 2018-01-31 DIAGNOSIS — F309 Manic episode, unspecified: Secondary | ICD-10-CM | POA: Diagnosis present

## 2018-01-31 DIAGNOSIS — Z794 Long term (current) use of insulin: Secondary | ICD-10-CM

## 2018-01-31 DIAGNOSIS — N2581 Secondary hyperparathyroidism of renal origin: Secondary | ICD-10-CM | POA: Diagnosis not present

## 2018-01-31 DIAGNOSIS — E118 Type 2 diabetes mellitus with unspecified complications: Secondary | ICD-10-CM | POA: Diagnosis present

## 2018-01-31 DIAGNOSIS — Y832 Surgical operation with anastomosis, bypass or graft as the cause of abnormal reaction of the patient, or of later complication, without mention of misadventure at the time of the procedure: Secondary | ICD-10-CM | POA: Diagnosis present

## 2018-01-31 DIAGNOSIS — M869 Osteomyelitis, unspecified: Secondary | ICD-10-CM | POA: Diagnosis present

## 2018-01-31 DIAGNOSIS — L98494 Non-pressure chronic ulcer of skin of other sites with necrosis of bone: Secondary | ICD-10-CM | POA: Diagnosis present

## 2018-01-31 DIAGNOSIS — Z9889 Other specified postprocedural states: Secondary | ICD-10-CM

## 2018-01-31 DIAGNOSIS — I120 Hypertensive chronic kidney disease with stage 5 chronic kidney disease or end stage renal disease: Secondary | ICD-10-CM | POA: Diagnosis not present

## 2018-01-31 DIAGNOSIS — Y92009 Unspecified place in unspecified non-institutional (private) residence as the place of occurrence of the external cause: Secondary | ICD-10-CM

## 2018-01-31 DIAGNOSIS — Z789 Other specified health status: Secondary | ICD-10-CM

## 2018-01-31 DIAGNOSIS — L97511 Non-pressure chronic ulcer of other part of right foot limited to breakdown of skin: Secondary | ICD-10-CM

## 2018-01-31 DIAGNOSIS — E1022 Type 1 diabetes mellitus with diabetic chronic kidney disease: Secondary | ICD-10-CM | POA: Diagnosis not present

## 2018-01-31 DIAGNOSIS — E10621 Type 1 diabetes mellitus with foot ulcer: Secondary | ICD-10-CM | POA: Diagnosis present

## 2018-01-31 DIAGNOSIS — E1043 Type 1 diabetes mellitus with diabetic autonomic (poly)neuropathy: Secondary | ICD-10-CM | POA: Diagnosis present

## 2018-01-31 DIAGNOSIS — E109 Type 1 diabetes mellitus without complications: Secondary | ICD-10-CM | POA: Diagnosis present

## 2018-01-31 DIAGNOSIS — B9561 Methicillin susceptible Staphylococcus aureus infection as the cause of diseases classified elsewhere: Secondary | ICD-10-CM | POA: Diagnosis present

## 2018-01-31 DIAGNOSIS — Z992 Dependence on renal dialysis: Secondary | ICD-10-CM

## 2018-01-31 DIAGNOSIS — E108 Type 1 diabetes mellitus with unspecified complications: Secondary | ICD-10-CM | POA: Diagnosis present

## 2018-01-31 LAB — COMPREHENSIVE METABOLIC PANEL
ALT: 10 U/L — ABNORMAL LOW (ref 17–63)
AST: 15 U/L (ref 15–41)
Albumin: 3 g/dL — ABNORMAL LOW (ref 3.5–5.0)
Alkaline Phosphatase: 120 U/L (ref 38–126)
Anion gap: 13 (ref 5–15)
BUN: 23 mg/dL — ABNORMAL HIGH (ref 6–20)
CO2: 32 mmol/L (ref 22–32)
Calcium: 9.1 mg/dL (ref 8.9–10.3)
Chloride: 91 mmol/L — ABNORMAL LOW (ref 101–111)
Creatinine, Ser: 6.26 mg/dL — ABNORMAL HIGH (ref 0.61–1.24)
GFR calc Af Amer: 12 mL/min — ABNORMAL LOW (ref >60)
GFR calc non Af Amer: 10 mL/min — ABNORMAL LOW (ref >60)
Glucose, Bld: 68 mg/dL (ref 65–99)
Potassium: 4.3 mmol/L (ref 3.5–5.1)
Sodium: 136 mmol/L (ref 135–145)
Total Bilirubin: 0.5 mg/dL (ref 0.3–1.2)
Total Protein: 8 g/dL (ref 6.5–8.1)

## 2018-01-31 LAB — CBC WITH DIFFERENTIAL/PLATELET
Basophils Absolute: 0 10*3/uL (ref 0.0–0.1)
Basophils Relative: 0 %
Eosinophils Absolute: 0.5 10*3/uL (ref 0.0–0.7)
Eosinophils Relative: 4 %
HCT: 31.4 % — ABNORMAL LOW (ref 39.0–52.0)
Hemoglobin: 9.9 g/dL — ABNORMAL LOW (ref 13.0–17.0)
Lymphocytes Relative: 13 %
Lymphs Abs: 1.7 10*3/uL (ref 0.7–4.0)
MCH: 28.8 pg (ref 26.0–34.0)
MCHC: 31.5 g/dL (ref 30.0–36.0)
MCV: 91.3 fL (ref 78.0–100.0)
Monocytes Absolute: 0.8 10*3/uL (ref 0.1–1.0)
Monocytes Relative: 6 %
Neutro Abs: 9.8 10*3/uL — ABNORMAL HIGH (ref 1.7–7.7)
Neutrophils Relative %: 77 %
Platelets: 295 10*3/uL (ref 150–400)
RBC: 3.44 MIL/uL — ABNORMAL LOW (ref 4.22–5.81)
RDW: 15.7 % — ABNORMAL HIGH (ref 11.5–15.5)
WBC: 12.9 10*3/uL — ABNORMAL HIGH (ref 4.0–10.5)

## 2018-01-31 LAB — I-STAT CG4 LACTIC ACID, ED: Lactic Acid, Venous: 1.17 mmol/L (ref 0.5–1.9)

## 2018-01-31 LAB — CBG MONITORING, ED: Glucose-Capillary: 60 mg/dL — ABNORMAL LOW (ref 65–99)

## 2018-01-31 NOTE — Telephone Encounter (Signed)
"  My name is Russelle.  I am the charge nurse at the kidney center.  I just want to make sure I am clear about what's going on.  [name] is saying he is going over to [facility] at 6 pm to have his surgery.  Is this the case?"  [name] was advised to go to the emergency room on Thursday to be evaluated an possibly admitted.  He said he was going to wait until after dialysis on Monday against our recommendation.  Once he is admitted and it's determined that he needs surgery, Dr. March Rummage is willing to go over to [facility] to perform his surgery.  He does not have surgery scheduled.  He has to be evaluated first.  He has bone exposure.  "Okay, I understand.  He is known to be non-compliant at times.  I will let him know the process."

## 2018-01-31 NOTE — ED Triage Notes (Signed)
Pt states that he has a foot ulcer on his R foot, followed by Dr. March Rummage, with foul odor and drainage, denies fevers. Pt was told to come here for surgery referral. States he has not ate today and requesting CBG checked, CBG 60, pt given meal.

## 2018-02-01 ENCOUNTER — Telehealth: Payer: Self-pay | Admitting: *Deleted

## 2018-02-01 ENCOUNTER — Encounter (HOSPITAL_COMMUNITY): Payer: Self-pay | Admitting: Emergency Medicine

## 2018-02-01 DIAGNOSIS — F309 Manic episode, unspecified: Secondary | ICD-10-CM | POA: Diagnosis not present

## 2018-02-01 DIAGNOSIS — E1065 Type 1 diabetes mellitus with hyperglycemia: Secondary | ICD-10-CM | POA: Diagnosis present

## 2018-02-01 DIAGNOSIS — M84477A Pathological fracture, right toe(s), initial encounter for fracture: Secondary | ICD-10-CM | POA: Diagnosis not present

## 2018-02-01 DIAGNOSIS — K3184 Gastroparesis: Secondary | ICD-10-CM | POA: Diagnosis not present

## 2018-02-01 DIAGNOSIS — N2581 Secondary hyperparathyroidism of renal origin: Secondary | ICD-10-CM | POA: Diagnosis not present

## 2018-02-01 DIAGNOSIS — L97419 Non-pressure chronic ulcer of right heel and midfoot with unspecified severity: Secondary | ICD-10-CM | POA: Diagnosis not present

## 2018-02-01 DIAGNOSIS — E1043 Type 1 diabetes mellitus with diabetic autonomic (poly)neuropathy: Secondary | ICD-10-CM | POA: Diagnosis not present

## 2018-02-01 DIAGNOSIS — S98911A Complete traumatic amputation of right foot, level unspecified, initial encounter: Secondary | ICD-10-CM | POA: Diagnosis not present

## 2018-02-01 DIAGNOSIS — L089 Local infection of the skin and subcutaneous tissue, unspecified: Secondary | ICD-10-CM | POA: Diagnosis not present

## 2018-02-01 DIAGNOSIS — E1069 Type 1 diabetes mellitus with other specified complication: Secondary | ICD-10-CM | POA: Diagnosis not present

## 2018-02-01 DIAGNOSIS — E10621 Type 1 diabetes mellitus with foot ulcer: Secondary | ICD-10-CM | POA: Diagnosis not present

## 2018-02-01 DIAGNOSIS — S91301A Unspecified open wound, right foot, initial encounter: Secondary | ICD-10-CM | POA: Diagnosis not present

## 2018-02-01 DIAGNOSIS — D649 Anemia, unspecified: Secondary | ICD-10-CM | POA: Diagnosis not present

## 2018-02-01 DIAGNOSIS — M86171 Other acute osteomyelitis, right ankle and foot: Secondary | ICD-10-CM

## 2018-02-01 DIAGNOSIS — Y92009 Unspecified place in unspecified non-institutional (private) residence as the place of occurrence of the external cause: Secondary | ICD-10-CM | POA: Diagnosis not present

## 2018-02-01 DIAGNOSIS — I120 Hypertensive chronic kidney disease with stage 5 chronic kidney disease or end stage renal disease: Secondary | ICD-10-CM | POA: Diagnosis not present

## 2018-02-01 DIAGNOSIS — M868X7 Other osteomyelitis, ankle and foot: Secondary | ICD-10-CM | POA: Diagnosis not present

## 2018-02-01 DIAGNOSIS — B9689 Other specified bacterial agents as the cause of diseases classified elsewhere: Secondary | ICD-10-CM | POA: Diagnosis not present

## 2018-02-01 DIAGNOSIS — E1022 Type 1 diabetes mellitus with diabetic chronic kidney disease: Secondary | ICD-10-CM | POA: Diagnosis not present

## 2018-02-01 DIAGNOSIS — Y832 Surgical operation with anastomosis, bypass or graft as the cause of abnormal reaction of the patient, or of later complication, without mention of misadventure at the time of the procedure: Secondary | ICD-10-CM | POA: Diagnosis present

## 2018-02-01 DIAGNOSIS — D631 Anemia in chronic kidney disease: Secondary | ICD-10-CM | POA: Diagnosis present

## 2018-02-01 DIAGNOSIS — E1122 Type 2 diabetes mellitus with diabetic chronic kidney disease: Secondary | ICD-10-CM | POA: Diagnosis not present

## 2018-02-01 DIAGNOSIS — T82818A Embolism of vascular prosthetic devices, implants and grafts, initial encounter: Secondary | ICD-10-CM | POA: Diagnosis not present

## 2018-02-01 DIAGNOSIS — L97511 Non-pressure chronic ulcer of other part of right foot limited to breakdown of skin: Secondary | ICD-10-CM | POA: Diagnosis not present

## 2018-02-01 DIAGNOSIS — N186 End stage renal disease: Secondary | ICD-10-CM | POA: Diagnosis not present

## 2018-02-01 DIAGNOSIS — E11621 Type 2 diabetes mellitus with foot ulcer: Secondary | ICD-10-CM | POA: Diagnosis not present

## 2018-02-01 DIAGNOSIS — Z794 Long term (current) use of insulin: Secondary | ICD-10-CM | POA: Diagnosis not present

## 2018-02-01 DIAGNOSIS — B9561 Methicillin susceptible Staphylococcus aureus infection as the cause of diseases classified elsewhere: Secondary | ICD-10-CM | POA: Diagnosis not present

## 2018-02-01 DIAGNOSIS — M869 Osteomyelitis, unspecified: Secondary | ICD-10-CM | POA: Diagnosis present

## 2018-02-01 DIAGNOSIS — L97519 Non-pressure chronic ulcer of other part of right foot with unspecified severity: Secondary | ICD-10-CM | POA: Diagnosis not present

## 2018-02-01 DIAGNOSIS — R159 Full incontinence of feces: Secondary | ICD-10-CM | POA: Diagnosis not present

## 2018-02-01 DIAGNOSIS — Z89421 Acquired absence of other right toe(s): Secondary | ICD-10-CM | POA: Diagnosis not present

## 2018-02-01 DIAGNOSIS — M898X9 Other specified disorders of bone, unspecified site: Secondary | ICD-10-CM | POA: Diagnosis present

## 2018-02-01 DIAGNOSIS — E871 Hypo-osmolality and hyponatremia: Secondary | ICD-10-CM | POA: Diagnosis not present

## 2018-02-01 DIAGNOSIS — Z992 Dependence on renal dialysis: Secondary | ICD-10-CM | POA: Diagnosis not present

## 2018-02-01 DIAGNOSIS — L98494 Non-pressure chronic ulcer of skin of other sites with necrosis of bone: Secondary | ICD-10-CM | POA: Diagnosis not present

## 2018-02-01 DIAGNOSIS — T82858A Stenosis of vascular prosthetic devices, implants and grafts, initial encounter: Secondary | ICD-10-CM | POA: Diagnosis not present

## 2018-02-01 DIAGNOSIS — E1169 Type 2 diabetes mellitus with other specified complication: Secondary | ICD-10-CM | POA: Diagnosis not present

## 2018-02-01 LAB — SEDIMENTATION RATE: Sed Rate: 79 mm/h — ABNORMAL HIGH (ref 0–16)

## 2018-02-01 LAB — C-REACTIVE PROTEIN: CRP: 8 mg/dL — ABNORMAL HIGH

## 2018-02-01 LAB — CBG MONITORING, ED
Glucose-Capillary: 149 mg/dL — ABNORMAL HIGH (ref 65–99)
Glucose-Capillary: 170 mg/dL — ABNORMAL HIGH (ref 65–99)
Glucose-Capillary: 329 mg/dL — ABNORMAL HIGH (ref 65–99)
Glucose-Capillary: 286 mg/dL — ABNORMAL HIGH (ref 65–99)

## 2018-02-01 LAB — GLUCOSE, CAPILLARY: Glucose-Capillary: 224 mg/dL — ABNORMAL HIGH (ref 65–99)

## 2018-02-01 MED ORDER — ACETAMINOPHEN 325 MG PO TABS: 650.0000 mg | ORAL_TABLET | Freq: Four times a day (QID) | ORAL | Status: DC | PRN

## 2018-02-01 MED ORDER — SODIUM CHLORIDE 0.9% FLUSH
3.0000 mL | Freq: Two times a day (BID) | INTRAVENOUS | Status: DC
  Administered 2018-02-01 – 2018-02-04 (×5): 3 mL via INTRAVENOUS

## 2018-02-01 MED ORDER — OXYCODONE HCL 5 MG PO TABS
5.0000 mg | ORAL_TABLET | Freq: Four times a day (QID) | ORAL | Status: DC | PRN
  Administered 2018-02-01: 5 mg via ORAL
  Filled 2018-02-01: qty 1

## 2018-02-01 MED ORDER — CINACALCET HCL 30 MG PO TABS
30.0000 mg | ORAL_TABLET | Freq: Every day | ORAL | Status: DC
  Administered 2018-02-01: 30 mg via ORAL
  Filled 2018-02-01 (×2): qty 1

## 2018-02-01 MED ORDER — INSULIN GLARGINE 100 UNIT/ML ~~LOC~~ SOLN
6.0000 [IU] | Freq: Every day | SUBCUTANEOUS | Status: DC
  Administered 2018-02-01: 6 [IU] via SUBCUTANEOUS
  Filled 2018-02-01 (×2): qty 0.06

## 2018-02-01 MED ORDER — INSULIN ASPART 100 UNIT/ML ~~LOC~~ SOLN
0.0000 [IU] | Freq: Three times a day (TID) | SUBCUTANEOUS | Status: DC
  Administered 2018-02-01: 5 [IU] via SUBCUTANEOUS
  Filled 2018-02-01: qty 1

## 2018-02-01 MED ORDER — HEPARIN SODIUM (PORCINE) 5000 UNIT/ML IJ SOLN: 5000.0000 [IU] | Freq: Three times a day (TID) | INTRAMUSCULAR | Status: DC

## 2018-02-01 MED ORDER — ONDANSETRON HCL 4 MG PO TABS
4.0000 mg | ORAL_TABLET | Freq: Four times a day (QID) | ORAL | Status: DC | PRN
  Filled 2018-02-01: qty 1

## 2018-02-01 MED ORDER — CEFEPIME HCL 1 G IJ SOLR
1.0000 g | INTRAMUSCULAR | Status: DC
  Administered 2018-02-01: 1 g via INTRAVENOUS
  Filled 2018-02-01: qty 1

## 2018-02-01 MED ORDER — ONDANSETRON HCL 4 MG/2ML IJ SOLN
4.0000 mg | Freq: Four times a day (QID) | INTRAMUSCULAR | Status: DC | PRN
  Administered 2018-02-02 – 2018-02-03 (×2): 4 mg via INTRAVENOUS
  Filled 2018-02-01 (×2): qty 2

## 2018-02-01 MED ORDER — AMLODIPINE BESYLATE 10 MG PO TABS
10.0000 mg | ORAL_TABLET | Freq: Every day | ORAL | Status: DC
  Administered 2018-02-01 – 2018-02-05 (×4): 10 mg via ORAL
  Filled 2018-02-01 (×4): qty 1

## 2018-02-01 MED ORDER — INSULIN GLARGINE 100 UNIT/ML ~~LOC~~ SOLN
20.0000 [IU] | Freq: Every day | SUBCUTANEOUS | Status: DC
  Filled 2018-02-01: qty 0.2

## 2018-02-01 MED ORDER — VANCOMYCIN HCL IN DEXTROSE 750-5 MG/150ML-% IV SOLN: 750.0000 mg | INTRAVENOUS | Status: DC

## 2018-02-01 MED ORDER — ACETAMINOPHEN 650 MG RE SUPP: 650.0000 mg | Freq: Four times a day (QID) | RECTAL | Status: DC | PRN

## 2018-02-01 MED ORDER — VANCOMYCIN HCL 10 G IV SOLR
1500.0000 mg | Freq: Once | INTRAVENOUS | Status: AC
  Administered 2018-02-01: 1500 mg via INTRAVENOUS
  Filled 2018-02-01: qty 1500

## 2018-02-01 MED ORDER — SEVELAMER CARBONATE 800 MG PO TABS
3200.0000 mg | ORAL_TABLET | Freq: Two times a day (BID) | ORAL | Status: DC
  Administered 2018-02-01: 3200 mg via ORAL
  Filled 2018-02-01 (×2): qty 4

## 2018-02-01 NOTE — Telephone Encounter (Signed)
Aldona Bar, [place] ED states she needs to speak with Dr. March Rummage concerning pt being admitted to [facility] and surgery.

## 2018-02-01 NOTE — ED Notes (Signed)
Report given to Ruth

## 2018-02-01 NOTE — Telephone Encounter (Signed)
Spoke to ED PA. Recommended admission for osteomyelitis . Will see patient in the hospital later today.

## 2018-02-01 NOTE — Consult Note (Signed)
Reason for Consult: Osteomyelitis R Foot Referring Physician: Jonn Shingles PA  [name] is an 37 y.o. male.  HPI: 37 year old male known to [place] presented to ED for R foot infection. Patient was seen in the office Thursday by which his ulcer to his R foot had exposed bone. Plan was direct admission for IV abx, plan for surgical resection. Direct admission attempted but pt refused. Patient refused to go to the ER that day. PO abx were given in the meantime. Patient went to the ER last night.   Patient seen at bedside. Has been taking abx as directed. Denies N/V/F/Ch.  Past Medical History:  Diagnosis Date  . Diabetes mellitus without complication ([place])   . Diabetic gastroparesis ([place])   . Dialysis patient ([place])   . Hypertension   . Renal disorder    Dialysis  . Sepsis [facility])     Past Surgical History:  Procedure Laterality Date  . AV FISTULA PLACEMENT     left arm.  . AV FISTULA PLACEMENT Right 12/22/2016   Procedure: INSERTION OF ARTERIOVENOUS (AV) GORE-TEX GRAFT ARM;  Surgeon: Elam Dutch, MD;  Location: [facility];  Service: Vascular;  Laterality: Right;  . EYE SURGERY    . I&D EXTREMITY Right 10/31/2017   Procedure: IRRIGATION AND DEBRIDEMENT RIGHT FOOT;  Surgeon: Evelina Bucy, DPM;  Location: [facility];  Service: Podiatry;  Laterality: Right;  . INSERTION OF DIALYSIS CATHETER     Right subclavian  . UPPER EXTREMITY VENOGRAPHY N/A 11/16/2016   Procedure: Upper Extremity Venography - Right Central;  Surgeon: Elam Dutch, MD;  Location: [facility];  Service: Cardiovascular;  Laterality: N/A;    Family History  Problem Relation Age of Onset  . Diabetes Mellitus II Unknown     Social History:  reports that he has never smoked. He has never used smokeless tobacco. He reports that he does not drink alcohol or use drugs.  Allergies:  Allergies  Allergen Reactions  . No Known Allergies     Medications: I have reviewed the patient's current  medications.  Results for orders placed or performed during the hospital encounter of 01/31/18 (from the past 48 hour(s))  CBG monitoring, ED     Status: Abnormal   Collection Time: 01/31/18 10:38 PM  Result Value Ref Range   Glucose-Capillary 60 (L) 65 - 99 mg/dL  Comprehensive metabolic panel     Status: Abnormal   Collection Time: 01/31/18 10:48 PM  Result Value Ref Range   Sodium 136 135 - 145 mmol/L   Potassium 4.3 3.5 - 5.1 mmol/L   Chloride 91 (L) 101 - 111 mmol/L   CO2 32 22 - 32 mmol/L   Glucose, Bld 68 65 - 99 mg/dL   BUN 23 (H) 6 - 20 mg/dL   Creatinine, Ser 6.26 (H) 0.61 - 1.24 mg/dL   Calcium 9.1 8.9 - 10.3 mg/dL   Total Protein 8.0 6.5 - 8.1 g/dL   Albumin 3.0 (L) 3.5 - 5.0 g/dL   AST 15 15 - 41 U/L   ALT 10 (L) 17 - 63 U/L   Alkaline Phosphatase 120 38 - 126 U/L   Total Bilirubin 0.5 0.3 - 1.2 mg/dL   GFR calc non Af Amer 10 (L) >60 mL/min   GFR calc Af Amer 12 (L) >60 mL/min    Comment: (NOTE) The eGFR has been calculated using the CKD EPI equation. This calculation has not been validated in all clinical situations. eGFR's  persistently <60 mL/min signify possible Chronic Kidney Disease.    Anion gap 13 5 - 15    Comment: Performed at [facility] [address], [place], [place] [address]  CBC with Differential     Status: Abnormal   Collection Time: 01/31/18 10:48 PM  Result Value Ref Range   WBC 12.9 (H) 4.0 - 10.5 K/uL   RBC 3.44 (L) 4.22 - 5.81 MIL/uL   Hemoglobin 9.9 (L) 13.0 - 17.0 g/dL   HCT 31.4 (L) 39.0 - 52.0 %   MCV 91.3 78.0 - 100.0 fL   MCH 28.8 26.0 - 34.0 pg   MCHC 31.5 30.0 - 36.0 g/dL   RDW 15.7 (H) 11.5 - 15.5 %   Platelets 295 150 - 400 K/uL   Neutrophils Relative % 77 %   Neutro Abs 9.8 (H) 1.7 - 7.7 K/uL   Lymphocytes Relative 13 %   Lymphs Abs 1.7 0.7 - 4.0 K/uL   Monocytes Relative 6 %   Monocytes Absolute 0.8 0.1 - 1.0 K/uL   Eosinophils Relative 4 %   Eosinophils Absolute 0.5 0.0 - 0.7 K/uL   Basophils Relative 0 %    Basophils Absolute 0.0 0.0 - 0.1 K/uL    Comment: Performed at [facility], [address], [place], [place] [address]  I-Stat CG4 Lactic Acid, ED     Status: None   Collection Time: 01/31/18 11:14 PM  Result Value Ref Range   Lactic Acid, Venous 1.17 0.5 - 1.9 mmol/L  CBG monitoring, ED     Status: Abnormal   Collection Time: 02/01/18  3:47 AM  Result Value Ref Range   Glucose-Capillary 149 (H) 65 - 99 mg/dL  CBG monitoring, ED     Status: Abnormal   Collection Time: 02/01/18  7:29 AM  Result Value Ref Range   Glucose-Capillary 170 (H) 65 - 99 mg/dL  Sedimentation rate     Status: Abnormal   Collection Time: 02/01/18 10:00 AM  Result Value Ref Range   Sed Rate 79 (H) 0 - 16 mm/hr    Comment: Performed at [facility], [place] [address], [place], [place] [address]  C-reactive protein     Status: Abnormal   Collection Time: 02/01/18 10:00 AM  Result Value Ref Range   CRP 8.0 (H) <1.0 mg/dL    Comment: Performed at [facility] [address] [place], [place] [address]  CBG monitoring, ED     Status: Abnormal   Collection Time: 02/01/18  2:11 PM  Result Value Ref Range   Glucose-Capillary 329 (H) 65 - 99 mg/dL  CBG monitoring, ED     Status: Abnormal   Collection Time: 02/01/18  5:13 PM  Result Value Ref Range   Glucose-Capillary 286 (H) 65 - 99 mg/dL   Comment 1 Notify RN    Comment 2 Document in Chart     Dg Foot Complete Right  Result Date: 01/31/2018 CLINICAL DATA:  37 year old male with right foot pain and edema. EXAM: RIGHT FOOT COMPLETE - 3+ VIEW COMPARISON:  Right foot radiograph dated 01/13/2018 FINDINGS: There is a mildly displaced fracture of the distal fifth metatarsal with erosive changes of the bone. There is also a mildly displaced fracture of the base of the proximal phalanx of the fifth toe with associated bone erosion. The head of the fifth metatarsal is displaced laterally and there is dislocation at the fifth MTP joint. Findings suspicious  for pathologic fracture secondary to  underlying infection/osteomyelitis. Clinical correlation is recommended. MRI may provide better evaluation. No other acute fracture or dislocation identified. The bones are somewhat osteopenic for age. There is diffuse subcutaneous edema and soft tissue thickening primarily over the dorsum of the foot and forefoot. Vascular calcifications noted. IMPRESSION: Displaced fractures of the distal fifth metatarsal and base of the proximal phalanx of the fifth digit likely secondary to underlying osteomyelitis. There is associated dislocation at the fifth MTP joint. MRI may provide better evaluation if clinically indicated. Electronically Signed   By: Anner Crete M.D.   On: 01/31/2018 23:16    ROS Blood pressure (!) 150/104, pulse 88, temperature 98.9 F (37.2 C), temperature source Oral, resp. rate 19, SpO2 100 %. Physical Exam Vitals:   02/01/18 1818 02/01/18 1917  BP: (!) 144/87 (!) 150/104  Pulse: 94 88  Resp: 18 19  Temp: 98.2 F (36.8 C) 98.9 F (37.2 C)  SpO2: 100% 100%   General AA&O x3. Normal mood and affect.  Vascular R foot warm to touch.  Neurologic Epicritic sensation grossly diminished.  Dermatologic (Wound)  Plantar first interspace wound with hyperkeratosis, granular base. No erythema. No local warmth. Lateral 5th MPJ wound with necrotic wound base. Palpable gray bone present in the wound. Fibrotic material present. No purulence. Edema present R foot and lower leg. No ascending cellulitis.  Orthopedic: MMT 5/5 in dorsiflexion, plantarflexion, inversion, and eversion. Normal lower extremity joint ROM without pain or crepitus.   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 Lysis of 5th proximal phalanx and 5th metatarsal head with pathologic fracture. -Awaiting MRI to evaluate the extent of bone infection. -Continue empiric abx. -Betadine WTD dressing applied. -Patient will ultimately benefit from  operative intervention. I had a long discussion with the patient about the need for amputation of both the toe and part of the metatarsal due to the extent of the bone infection. Patient initially requested second opinion which I readily welcomed and explained was his right. I explained in depth the rationale of the 5th toe amputation, including bone infection, non-function, and need for soft tissue closure. Patient later reconsidered his request for a second opinion. Should patient request again, recommend consult to Dr. Sharol Given for second opinion. -Patient to be NPO tomorrow for possible surgery. Plan for R 5th toe amputation, R 5th metatarsal resection, R plantar interspace wound debridement, possible application of wound VAC. Final plan pending MRI findings.  Evelina Bucy 02/01/2018, 10:47 PM

## 2018-02-01 NOTE — H&P (Signed)
Date: 02/01/2018               Patient Name:  [name] MRN: [id number]  DOB: July 27, 1981 Age / Sex: 37 y.o., male   PCP: Patient, No Pcp Per         Medical Service: Internal Medicine Teaching Service         Attending Physician: Dr. Oval Linsey, MD    First Contact: Dr. Ronalee Red Pager: [phone]  Second Contact: Dr. Philipp Ovens Pager: [phone]       After Hours (After 5p/  First Contact Pager: [phone]  weekends / holidays): Second Contact Pager: [phone]   Chief Complaint: Foot pain  History of Present Illness: This is a 37 y.o. man with PMHx significant for ESRD on HD (MWF), Type 1 DM, diabetic foot ulcers, HTN who presented to the ED with complaint of his right foot wound.  He is followed by Dr March Rummage for this wound and has been seen recently in the outpatient clinic for the same.  Patient was difficult to obtain history from due to his focus on when and what he can eat, the scheduling of his surgery, and the necessity of his overall admission as well as IV versus PO antibiotics.  He was seen at the outpatient clinic on May 2 and was recommended to be directly admitted for surgical debridement and bone resection for osteomyelitis.  He declined admission at that time and was started on PO antibiotics which he reports adherence to.  He states his foot has been a little more swollen since his outpatient visit but otherwise has not acutely changed.  There have been no fevers or chills.  He has not experienced any chest pain, SOB, dizziness, cough.  He has been compliant with his MWF HD schedule per his report.   Meds:  No outpatient medications have been marked as taking for the 01/31/18 encounter [facility] Encounter).     Allergies: Allergies as of 01/31/2018 - Review Complete 01/31/2018  Allergen Reaction Noted  . No known allergies  12/07/2016   Past Medical History:  Diagnosis Date  . Diabetes mellitus without complication ([place])   . Diabetic gastroparesis ([place])   . Dialysis  patient ([place])   . Hypertension   . Renal disorder    Dialysis  . Sepsis ([place])     Family History:  Family History  Problem Relation Age of Onset  . Diabetes Mellitus II Unknown      Social History:  Social History   Socioeconomic History  . Marital status: Single    Spouse name: Not on file  . Number of children: Not on file  . Years of education: Not on file  . Highest education level: Not on file  Occupational History  . Not on file  Social Needs  . Financial resource strain: Not on file  . Food insecurity:    Worry: Not on file    Inability: Not on file  . Transportation needs:    Medical: Not on file    Non-medical: Not on file  Tobacco Use  . Smoking status: Never Smoker  . Smokeless tobacco: Never Used  Substance and Sexual Activity  . Alcohol use: No  . Drug use: No  . Sexual activity: Never  Lifestyle  . Physical activity:    Days per week: Not on file    Minutes per session: Not on file  . Stress: Not on file  Relationships  . Social connections:    Talks  on phone: Not on file    Gets together: Not on file    Attends religious service: Not on file    Active member of club or organization: Not on file    Attends meetings of clubs or organizations: Not on file    Relationship status: Not on file  Other Topics Concern  . Not on file  Social History Narrative  . Not on file    Review of Systems: A complete ROS was negative except as per HPI.   Physical Exam: Blood pressure (!) 157/102, pulse 85, temperature 99 F (37.2 C), resp. rate 18, SpO2 99 %. Physical Exam  Constitutional: He is oriented to person, place, and time.  He is sitting up in bed, no distress.  He has pressured speech  HENT:  Head: Normocephalic and atraumatic.  Eyes: Conjunctivae and EOM are normal.  Cardiovascular: Normal rate and regular rhythm.  Pulmonary/Chest: Effort normal. He has no wheezes. He has no rales.  Musculoskeletal: He exhibits no edema.  His right foot  has 2 wounds: 1. Ulceration noted over his plantar surface at the first 2 metatarsal head areas (See media tab) 2.  Ulceration with bony exposure at 5th metatarsal head lateral aspect. He has no tenderness on palpation and reports just being able to feel the pressure from palpating.   Neurological: He is alert and oriented to person, place, and time. No cranial nerve deficit.  Skin:  He has multiple tattoos.      EKG: Not available  CXR: Not available  DG Right Foot: Displaced fractures of the distal fifth metatarsal and base of the proximal phalanx of the fifth digit likely secondary to underlying osteomyelitis. There is associated dislocation at the fifth MTP joint.   LABS: CMET consistent with ESRD.  No acute electrolyte abnormalities. CBC with WBC of 12.9, Hgb 9.9, Platelets 295 Lactic acid 1.17 ESR 79 CRP 8.0 Blood cultures x 2 pending  Assessment & Plan by Problem:  37 yo man with ESRD, HTN, DM type 1, and diabetic foot ulcers with osteomyelitis presents to the ED for his foot wound and to expedite surgical debridement.   # Osteomyelitis of 5th Metatarsal Head ## Diabetic Foot Ulcer of First and Second Metatarsal Area - Podiatry Dr March Rummage to see today.  Appreciate recommendations - He is hemodynamically stable and afebrile in the ED.  He was on Ciprofloxacin and Clindamycin outpatient and has been given Vancomycin and Cefepime in the ED.  This likely makes intraoperative cultures lower yield, but will hold off on further antibiotics pending podiatry evaluation this evening.  If surgery planned for tomorrow, can likely await surgical cultures before resuming antibiotics. - MRI right foot without contrast to evaluate viability of fifth toe and metatarsal. - WOC consult for right foot interspace ulceration  # ESRD He dialyzes MWF and reports adherence to this schedule. - Nephrology consulted for inpatient HD needs.  Assistance appreciated. - Continue Renvela and Sensipar  #  Type 1 DM Home medications are Lantus 20 units QHS and sliding scale Novolog.  Most recent blood sugar is 329 - Lantus 20 units QHS - Novolog sliding scale  # HTN Blood pressure is mildly elevated in the ED.  Patient recalls he is on 2 BP meds but is unsure the names.  Amlodipine and Losartan are listed on his medication list.  - Continue Amlodipine 38m daily - Holding Losartan  # Anemia Likely anemia of chronic renal disease with hemoglobin near baseline.  # FEN Fluids: None  Electrolytes: Monitor renal function labs Nutrition: Regular diet with fluid restriction  # DVT PPx: Heparin SQ  # CODE: FULL   Dispo: Admit patient to Inpatient with expected length of stay greater than 2 midnights.  Signed: Jule Ser, DO 02/01/2018, 2:15 PM  Pager: [phone]

## 2018-02-01 NOTE — ED Notes (Signed)
Pt continues to remain on phone

## 2018-02-01 NOTE — ED Notes (Signed)
Pt very vocal about wanting to eat. States he will be very mad if he has not been able to eat this whole time bc surgery wont be today. RN reassured him that this is what is best for him. Wound dressed with zeroform and gauze.

## 2018-02-01 NOTE — Progress Notes (Signed)
Received pt per wheelchair. Pt alert and oriented. Complains of moderate pain to right foot. Paged MD on call for pain medicine. PT verbalized feeling of denial to current situation. Would not want foot amputation as possible.  Reassured pt.  Reminded pt of NPO at midnight. Verbalized understanding. Will monitor pt.

## 2018-02-01 NOTE — Telephone Encounter (Signed)
"  I am at the hospital now.  I been here since 7 pm.  I want to let Dr. March Rummage know I am here.  I been waiting all this time."  You are supposed to be evaluated.  If they feel like you need to be admitted, we will go from there.  If it's determined you need surgery, Dr. March Rummage is willing to do your surgery.  "Okay so if they send me home, what do I do?  They are calling me now.  Let me get your direct number."  My direct number is [phone].  "At least I know who to contact.  If they send me home I can call you and make an appointment to see Dr. March Rummage."

## 2018-02-01 NOTE — ED Provider Notes (Signed)
[facility] EMERGENCY DEPARTMENT Provider Note   CSN: [id number] Arrival date & time: 01/31/18  2114     History   Chief Complaint Chief Complaint  Patient presents with  . Foot Pain    HPI KEVONTAY BURKS is a 37 y.o. male with a pmhx of ESRD on HD, DM, diabetic foot ulcer who presented to the ED today complaining of a foot wound. Pt has been followed very closely by Dr. Hardie Pulley with [facility]. Per chart review pt was seen in clinic 01/27/18 at which time it was recommended that he be directly admitted for surgery to debride the wound and for bone resection for osteomyelitis.  At that time however pt did not want to go to the hospital because he had "things to take care of at home". He was given oral antibiotics as he could not be given IV without coming to the hospital, which he states that he has been compliant with. He denies any new change in his foot ulcer since he was seen in clinic. No fevers, chills. He has home health care nurses who change his dressing.   HPI  Past Medical History:  Diagnosis Date  . Diabetes mellitus without complication ([place])   . Diabetic gastroparesis ([place])   . Dialysis patient ([place])   . Hypertension   . Renal disorder    Dialysis  . Sepsis [facility])     Patient Active Problem List   Diagnosis Date Noted  . Delirium   . Altered mental status   . Ulcerated, foot, right, limited to breakdown of skin ([place])   . Osteomyelitis of ankle or foot, acute, right ([place]) 10/30/2017  . Abscess or cellulitis of toe, right   . Cellulitis of right lower extremity   . Right foot infection   . Diabetic foot infection ([place])   . Cellulitis of right foot 10/26/2017  . Diabetes mellitus type 1 ([place]) 10/25/2017  . Diabetic gastroparesis ([place]) 10/25/2017  . Hemodialysis patient ([place]) 10/25/2017  . Sepsis ([place]) 10/25/2017  . DKA (diabetic ketoacidoses) ([place]) 05/27/2015  . Essential (primary) hypertension 08/09/2014  . ESRD (end stage  renal disease) on dialysis ([place]) 03/19/2014  . Pain, abdominal, generalized 03/19/2014  . Bacteremia due to coagulase-negative Staphylococcus 05/31/2013  . Leukocytosis 05/31/2013  . Hyponatremia 05/29/2013  . Metabolic acidosis 33/54/5625    Past Surgical History:  Procedure Laterality Date  . AV FISTULA PLACEMENT     left arm.  . AV FISTULA PLACEMENT Right 12/22/2016   Procedure: INSERTION OF ARTERIOVENOUS (AV) GORE-TEX GRAFT ARM;  Surgeon: Elam Dutch, MD;  Location: [place];  Service: Vascular;  Laterality: Right;  . EYE SURGERY    . I&D EXTREMITY Right 10/31/2017   Procedure: IRRIGATION AND DEBRIDEMENT RIGHT FOOT;  Surgeon: Evelina Bucy, DPM;  Location: [place];  Service: Podiatry;  Laterality: Right;  . INSERTION OF DIALYSIS CATHETER     Right subclavian  . UPPER EXTREMITY VENOGRAPHY N/A 11/16/2016   Procedure: Upper Extremity Venography - Right Central;  Surgeon: Elam Dutch, MD;  Location: [place] CV LAB;  Service: Cardiovascular;  Laterality: N/A;        Home Medications    Prior to Admission medications   Medication Sig Start Date End Date Taking? Authorizing Provider  acetaminophen (TYLENOL) 325 MG tablet Take 2 tablets (650 mg total) by mouth every 6 (six) hours as needed for mild pain or headache (pain/headache). Can restart this after you are done with  Oxycodone/Tylenol 11/10/17   Domenic Polite, MD  amLODipine (NORVASC) 10 MG tablet Take 10 mg by mouth daily.    [provider]  ceFAZolin (ANCEF) IVPB Inject 2 g into the vein every Monday, Wednesday, and Friday with hemodialysis. For 3 weeks 11/05/17   Evelina Bucy, DPM  cinacalcet (SENSIPAR) 30 MG tablet Take 30 mg by mouth daily with supper.    [provider]  ciprofloxacin (CIPRO) 250 MG tablet Take 1 tablet (250 mg total) by mouth 2 (two) times daily. 01/27/18   Evelina Bucy, DPM  clindamycin (CLEOCIN) 300 MG capsule Take 1 capsule (300 mg total) by mouth 2 (two) times daily.  01/27/18   Evelina Bucy, DPM  clonazePAM (KLONOPIN) 0.5 MG tablet Take 0.5 mg by mouth daily as needed for anxiety (sleep).  07/21/17   [provider]  collagenase (SANTYL) ointment Apply 1 application topically daily. 11/24/17   Evelina Bucy, DPM  insulin aspart (NOVOLOG) 100 UNIT/ML injection Inject 5 Units into the skin 2 (two) times daily with a meal.     [provider]  insulin glargine (LANTUS) 100 UNIT/ML injection Inject 0.2 mLs (20 Units total) into the skin at bedtime. 11/03/17   Domenic Polite, MD  losartan (COZAAR) 50 MG tablet Take 50 mg by mouth daily. 09/27/17   [provider]  ondansetron (ZOFRAN ODT) 4 MG disintegrating tablet Take 1 tablet (4 mg total) by mouth every 8 (eight) hours as needed for nausea or vomiting. 11/23/17   Ward, Ozella Almond, PA-C  oseltamivir (TAMIFLU) 30 MG capsule Take 76m orally immediately after every dialysis treatment.  First dose given in ER. 11/23/17   Ward, JOzella Almond PA-C  oxyCODONE-acetaminophen (PERCOCET/ROXICET) 5-325 MG tablet Take 1 tablet by mouth every 6 (six) hours as needed. 11/26/17   WTrula Slade DPM  sevelamer carbonate (RENVELA) 800 MG tablet Take 2,400-3,200 mg by mouth See admin instructions. Take 3 - 4 tablets (2400 mg -3200 mg) by mouth two times daily with meals    [provider]    Family History Family History  Problem Relation Age of Onset  . Diabetes Mellitus II Unknown     Social History Social History   Tobacco Use  . Smoking status: Never Smoker  . Smokeless tobacco: Never Used  Substance Use Topics  . Alcohol use: No  . Drug use: No     Allergies   No known allergies   Review of Systems Review of Systems  All other systems reviewed and are negative.    Physical Exam Updated Vital Signs BP (!) 141/85   Pulse 91   Temp 99 F (37.2 C)   Resp 17   SpO2 96%   Physical Exam  Constitutional: He is oriented to person, place, and time. He appears  well-developed and well-nourished. No distress.  HENT:  Head: Normocephalic and atraumatic.  Mouth/Throat: No oropharyngeal exudate.  Eyes: Pupils are equal, round, and reactive to light. Conjunctivae and EOM are normal. Right eye exhibits no discharge. Left eye exhibits no discharge. No scleral icterus.  Cardiovascular: Normal rate and regular rhythm. Exam reveals no gallop.  Pulmonary/Chest: Effort normal and breath sounds normal. No respiratory distress. He has no wheezes. He has no rales. He exhibits no tenderness.  Abdominal: Soft. He exhibits no distension. There is no tenderness. There is no guarding.  Musculoskeletal: Normal range of motion. He exhibits no edema.  Neurological: He is alert and oriented to person, place, and time.  Skin: Skin is warm and dry. No rash noted. He is not diaphoretic. No erythema. No pallor.  Psychiatric: He has a normal mood and affect. His behavior is normal.  Nursing note and vitals reviewed.        ED Treatments / Results  Labs (all labs ordered are listed, but only abnormal results are displayed) Labs Reviewed  COMPREHENSIVE METABOLIC PANEL - Abnormal; Notable for the following components:      Result Value   Chloride 91 (*)    BUN 23 (*)    Creatinine, Ser 6.26 (*)    Albumin 3.0 (*)    ALT 10 (*)    GFR calc non Af Amer 10 (*)    GFR calc Af Amer 12 (*)    All other components within normal limits  CBC WITH DIFFERENTIAL/PLATELET - Abnormal; Notable for the following components:   WBC 12.9 (*)    RBC 3.44 (*)    Hemoglobin 9.9 (*)    HCT 31.4 (*)    RDW 15.7 (*)    Neutro Abs 9.8 (*)    All other components within normal limits  CBG MONITORING, ED - Abnormal; Notable for the following components:   Glucose-Capillary 60 (*)    All other components within normal limits  CBG MONITORING, ED - Abnormal; Notable for the following components:   Glucose-Capillary 149 (*)    All other components within normal limits  CBG MONITORING, ED  - Abnormal; Notable for the following components:   Glucose-Capillary 170 (*)    All other components within normal limits  CULTURE, BLOOD (ROUTINE X 2)  CULTURE, BLOOD (ROUTINE X 2)  URINALYSIS, ROUTINE W REFLEX MICROSCOPIC  SEDIMENTATION RATE  C-REACTIVE PROTEIN  I-STAT CG4 LACTIC ACID, ED    EKG None  Radiology Dg Foot Complete Right  Result Date: 01/31/2018 CLINICAL DATA:  37 year old male with right foot pain and edema. EXAM: RIGHT FOOT COMPLETE - 3+ VIEW COMPARISON:  Right foot radiograph dated 01/13/2018 FINDINGS: There is a mildly displaced fracture of the distal fifth metatarsal with erosive changes of the bone. There is also a mildly displaced fracture of the base of the proximal phalanx of the fifth toe with associated bone erosion. The head of the fifth metatarsal is displaced laterally and there is dislocation at the fifth MTP joint. Findings suspicious for pathologic fracture secondary to underlying infection/osteomyelitis. Clinical correlation is recommended. MRI may provide better evaluation. No other acute fracture or dislocation identified. The bones are somewhat osteopenic for age. There is diffuse subcutaneous edema and soft tissue thickening primarily over the dorsum of the foot and forefoot. Vascular calcifications noted. IMPRESSION: Displaced fractures of the distal fifth metatarsal and base of the proximal phalanx of the fifth digit likely secondary to underlying osteomyelitis. There is associated dislocation at the fifth MTP joint. MRI may provide better evaluation if clinically indicated. Electronically Signed   By: Anner Crete M.D.   On: 01/31/2018 23:16    Procedures Procedures (including critical care time)  Medications Ordered in ED Medications  ceFEPIme (MAXIPIME) 1 g in sodium chloride 0.9 % 100 mL IVPB (has no administration in time range)  vancomycin (VANCOCIN) 1,500 mg in sodium chloride 0.9 % 500 mL IVPB (has no administration in time range)    vancomycin (VANCOCIN) IVPB 750 mg/150 ml premix (has no administration in time range)       Initial Impression / Assessment and Plan / ED Course  I have reviewed the triage vital signs and the nursing  notes.  Pertinent labs & imaging results that were available during my care of the patient were reviewed by me and considered in my medical decision making (see chart for details).     37 y.o M with a pmhx of ESRD on HD, DM who presented to the ED with diabetic foot ulcer. He has known osteomyelitis, seen in clinic by Dr. March Rummage on 5/2 at which time he was ntoed to have bony exposure through his wound. Pt was recommended to be directly admitted for IV antibiotics, surgery for debridement and partial bone resection. ON arrival to the ED, pt is non-toxic and non-septic appearing. Afebrile. Xray foot shows Displaced fractures of the distal fifth metatarsal and base of the proximal phalanx of the fifth digit likely secondary to underlyingosteomyelitis. There is associated dislocation at the fifth MTP joint.   I spoke with Dr. March Rummage over the phone, he recommended admission with IV antibiotics, checking CRP and ESR. He will be by to see pt later today after admission. He has been started on IV vancomycin and cefepime. Blood cultures also obtained. Will consult hospitalist for admission.   Spoke with internal medicine team, they will admit to their service. Patient was discussed with and seen by Dr. Lita Mains who agrees with the treatment plan.    Final Clinical Impressions(s) / ED Diagnoses   Final diagnoses:  None    ED Discharge Orders    None       Carlos Levering, PA-C 02/01/18 1156    Julianne Rice, MD 02/03/18 [phone]

## 2018-02-01 NOTE — ED Notes (Signed)
Attempted to call report to floor 

## 2018-02-01 NOTE — ED Notes (Signed)
Check CBG

## 2018-02-01 NOTE — ED Notes (Signed)
Admitting provider at bedside now.

## 2018-02-01 NOTE — Progress Notes (Signed)
Pharmacy Antibiotic Note  [name] is a 37 y.o. male admitted on 01/31/2018 with osteomyelitis.  Pharmacy has been consulted for cefepime and vancomycin dosing. Pt is here with a R foot ulcer. WBC 12.9. Patient has a h/o CKD on MWF HD. He received his last session of HD yesterday.   Plan: -Vancomycin 1500 mg IV once, then vancomycin 750 mg on M/W/F with HD session -Cefepime 1 gm IV Q 24 hours  -Monitor CBC, cultures and clinical progress -VT at SS     Temp (24hrs), Avg:98.9 F (37.2 C), Min:98.7 F (37.1 C), Max:99 F (37.2 C)  Recent Labs  Lab 01/31/18 2248 01/31/18 2314  WBC 12.9*  --   CREATININE 6.26*  --   LATICACIDVEN  --  1.17    CrCl cannot be calculated (Unknown ideal weight.).    Allergies  Allergen Reactions  . No Known Allergies     Antimicrobials this admission: Vanc 5/7 >>  Cefepime 5/7 >>   Dose adjustments this admission: None  Microbiology results:   Thank you for allowing pharmacy to be a part of this patient's care.  Albertina Parr, PharmD., BCPS Clinical Pharmacist Clinical phone for 02/01/18 until 3:30pm: [phone] If after 3:30pm, please call main pharmacy at: [phone]

## 2018-02-01 NOTE — ED Notes (Signed)
Pt out at the RN desk stating "I am going to eat whatever I want, you guys must have never heard of me. "

## 2018-02-01 NOTE — ED Notes (Signed)
Medication delay is due to waiting for med rec

## 2018-02-01 NOTE — ED Notes (Signed)
Pt's CBG result was 286. Informed Tray - RN.

## 2018-02-01 NOTE — ED Notes (Addendum)
RN had secretary check to see who is going to admit patient. She paged it out to unassigned internal. Patient on telephone angrily discussing that if no surgery today then he will leave and come back day of surgery. RN discussed with him that POC is not known right now.

## 2018-02-02 ENCOUNTER — Inpatient Hospital Stay (HOSPITAL_COMMUNITY): Payer: Medicare Other | Admitting: Anesthesiology

## 2018-02-02 ENCOUNTER — Encounter (HOSPITAL_COMMUNITY): Payer: Self-pay | Admitting: Anesthesiology

## 2018-02-02 ENCOUNTER — Other Ambulatory Visit: Payer: Self-pay

## 2018-02-02 ENCOUNTER — Inpatient Hospital Stay (HOSPITAL_COMMUNITY): Payer: Medicare Other

## 2018-02-02 ENCOUNTER — Encounter (HOSPITAL_COMMUNITY)
Admission: EM | Disposition: A | Discharge status: Home / Self Care | Payer: Self-pay | Source: Home / Self Care | Attending: Internal Medicine

## 2018-02-02 DIAGNOSIS — M869 Osteomyelitis, unspecified: Secondary | ICD-10-CM

## 2018-02-02 DIAGNOSIS — K3184 Gastroparesis: Secondary | ICD-10-CM

## 2018-02-02 DIAGNOSIS — D649 Anemia, unspecified: Secondary | ICD-10-CM

## 2018-02-02 DIAGNOSIS — L089 Local infection of the skin and subcutaneous tissue, unspecified: Secondary | ICD-10-CM

## 2018-02-02 DIAGNOSIS — L97519 Non-pressure chronic ulcer of other part of right foot with unspecified severity: Secondary | ICD-10-CM

## 2018-02-02 DIAGNOSIS — E1043 Type 1 diabetes mellitus with diabetic autonomic (poly)neuropathy: Secondary | ICD-10-CM

## 2018-02-02 DIAGNOSIS — E1022 Type 1 diabetes mellitus with diabetic chronic kidney disease: Secondary | ICD-10-CM

## 2018-02-02 DIAGNOSIS — Z79899 Other long term (current) drug therapy: Secondary | ICD-10-CM

## 2018-02-02 DIAGNOSIS — Z841 Family history of disorders of kidney and ureter: Secondary | ICD-10-CM

## 2018-02-02 DIAGNOSIS — E1069 Type 1 diabetes mellitus with other specified complication: Principal | ICD-10-CM

## 2018-02-02 DIAGNOSIS — E10621 Type 1 diabetes mellitus with foot ulcer: Secondary | ICD-10-CM

## 2018-02-02 DIAGNOSIS — L98494 Non-pressure chronic ulcer of skin of other sites with necrosis of bone: Secondary | ICD-10-CM

## 2018-02-02 HISTORY — PX: AMPUTATION: SHX166

## 2018-02-02 HISTORY — PX: APPLICATION OF WOUND VAC: SHX5189

## 2018-02-02 LAB — GLUCOSE, CAPILLARY
Glucose-Capillary: 131 mg/dL — ABNORMAL HIGH (ref 65–99)
Glucose-Capillary: 161 mg/dL — ABNORMAL HIGH (ref 65–99)
Glucose-Capillary: 168 mg/dL — ABNORMAL HIGH (ref 65–99)
Glucose-Capillary: 172 mg/dL — ABNORMAL HIGH (ref 65–99)
Glucose-Capillary: 430 mg/dL — ABNORMAL HIGH (ref 65–99)

## 2018-02-02 LAB — RENAL FUNCTION PANEL
Albumin: 2.4 g/dL — ABNORMAL LOW (ref 3.5–5.0)
Anion gap: 12 (ref 5–15)
BUN: 43 mg/dL — ABNORMAL HIGH (ref 6–20)
CO2: 27 mmol/L (ref 22–32)
Calcium: 8.7 mg/dL — ABNORMAL LOW (ref 8.9–10.3)
Chloride: 93 mmol/L — ABNORMAL LOW (ref 101–111)
Creatinine, Ser: 8.99 mg/dL — ABNORMAL HIGH (ref 0.61–1.24)
GFR calc Af Amer: 8 mL/min — ABNORMAL LOW (ref >60)
GFR calc non Af Amer: 7 mL/min — ABNORMAL LOW (ref >60)
Glucose, Bld: 146 mg/dL — ABNORMAL HIGH (ref 65–99)
Phosphorus: 4.7 mg/dL — ABNORMAL HIGH (ref 2.5–4.6)
Potassium: 4.9 mmol/L (ref 3.5–5.1)
Sodium: 132 mmol/L — ABNORMAL LOW (ref 135–145)

## 2018-02-02 LAB — CBC
HCT: 31 % — ABNORMAL LOW (ref 39.0–52.0)
Hemoglobin: 9.6 g/dL — ABNORMAL LOW (ref 13.0–17.0)
MCH: 27.7 pg (ref 26.0–34.0)
MCHC: 31 g/dL (ref 30.0–36.0)
MCV: 89.6 fL (ref 78.0–100.0)
Platelets: 261 10*3/uL (ref 150–400)
RBC: 3.46 MIL/uL — ABNORMAL LOW (ref 4.22–5.81)
RDW: 15 % (ref 11.5–15.5)
WBC: 10 10*3/uL (ref 4.0–10.5)

## 2018-02-02 LAB — MRSA PCR SCREENING: MRSA by PCR: NEGATIVE

## 2018-02-02 SURGERY — AMPUTATION, FOOT, RAY
Anesthesia: Monitor Anesthesia Care | Site: Foot | Laterality: Right

## 2018-02-02 MED ORDER — 0.9 % SODIUM CHLORIDE (POUR BTL) OPTIME
TOPICAL | Status: DC | PRN
  Administered 2018-02-02: 1000 mL

## 2018-02-02 MED ORDER — HEPARIN SODIUM (PORCINE) 1000 UNIT/ML DIALYSIS
9000.0000 [IU] | Freq: Once | INTRAMUSCULAR | Status: DC
  Filled 2018-02-02: qty 9

## 2018-02-02 MED ORDER — PROPOFOL 1000 MG/100ML IV EMUL
INTRAVENOUS | Status: AC
  Filled 2018-02-02: qty 100

## 2018-02-02 MED ORDER — SODIUM CHLORIDE 0.9 % IV SOLN
INTRAVENOUS | Status: DC
  Administered 2018-02-02: 14:00:00 via INTRAVENOUS

## 2018-02-02 MED ORDER — MIDAZOLAM HCL 2 MG/2ML IJ SOLN
INTRAMUSCULAR | Status: AC
  Filled 2018-02-02: qty 2

## 2018-02-02 MED ORDER — PENTAFLUOROPROP-TETRAFLUOROETH EX AERO: 1.0000 "application " | INHALATION_SPRAY | CUTANEOUS | Status: DC | PRN

## 2018-02-02 MED ORDER — SODIUM CHLORIDE 0.9 % IV SOLN: 100.0000 mL | INTRAVENOUS | Status: DC | PRN

## 2018-02-02 MED ORDER — CALCITRIOL 0.5 MCG PO CAPS
1.7500 ug | ORAL_CAPSULE | Freq: Every day | ORAL | Status: DC
  Administered 2018-02-02 – 2018-02-05 (×4): 1.75 ug via ORAL
  Filled 2018-02-02 (×4): qty 1

## 2018-02-02 MED ORDER — FENTANYL CITRATE (PF) 250 MCG/5ML IJ SOLN
INTRAMUSCULAR | Status: AC
  Filled 2018-02-02: qty 10

## 2018-02-02 MED ORDER — DARBEPOETIN ALFA 40 MCG/0.4ML IJ SOSY
PREFILLED_SYRINGE | INTRAMUSCULAR | Status: AC
  Filled 2018-02-02: qty 0.4

## 2018-02-02 MED ORDER — PROPOFOL 10 MG/ML IV BOLUS
INTRAVENOUS | Status: DC | PRN
  Administered 2018-02-02 (×5): 20 mg via INTRAVENOUS

## 2018-02-02 MED ORDER — PROMETHAZINE HCL 25 MG/ML IJ SOLN: 6.2500 mg | INTRAMUSCULAR | Status: DC | PRN

## 2018-02-02 MED ORDER — VANCOMYCIN HCL 500 MG IV SOLR
INTRAVENOUS | Status: DC | PRN
  Administered 2018-02-02: 500 mg via TOPICAL

## 2018-02-02 MED ORDER — LIDOCAINE HCL (PF) 1 % IJ SOLN: 5.0000 mL | INTRAMUSCULAR | Status: DC | PRN

## 2018-02-02 MED ORDER — HYDROMORPHONE HCL 2 MG/ML IJ SOLN
1.0000 mg | INTRAMUSCULAR | Status: DC | PRN
  Administered 2018-02-02 – 2018-02-03 (×3): 1 mg via INTRAVENOUS
  Filled 2018-02-02 (×3): qty 1

## 2018-02-02 MED ORDER — HYDROMORPHONE HCL 2 MG/ML IJ SOLN
0.2500 mg | INTRAMUSCULAR | Status: DC | PRN
Start: 2018-02-02 — End: 2018-02-02

## 2018-02-02 MED ORDER — SEVELAMER CARBONATE 800 MG PO TABS
4000.0000 mg | ORAL_TABLET | Freq: Two times a day (BID) | ORAL | Status: DC
  Filled 2018-02-02: qty 5

## 2018-02-02 MED ORDER — VANCOMYCIN HCL 500 MG IV SOLR
INTRAVENOUS | Status: AC
  Filled 2018-02-02: qty 500

## 2018-02-02 MED ORDER — MEPERIDINE HCL 50 MG/ML IJ SOLN: 6.2500 mg | INTRAMUSCULAR | Status: DC | PRN

## 2018-02-02 MED ORDER — HYDROMORPHONE HCL 2 MG/ML IJ SOLN
0.5000 mg | Freq: Once | INTRAMUSCULAR | Status: AC
  Administered 2018-02-02: 0.5 mg via INTRAVENOUS
  Filled 2018-02-02: qty 1

## 2018-02-02 MED ORDER — PROPOFOL 10 MG/ML IV BOLUS
INTRAVENOUS | Status: AC
  Filled 2018-02-02: qty 20

## 2018-02-02 MED ORDER — ALTEPLASE 2 MG IJ SOLR: 2.0000 mg | Freq: Once | INTRAMUSCULAR | Status: DC | PRN

## 2018-02-02 MED ORDER — SEVELAMER CARBONATE 800 MG PO TABS
4000.0000 mg | ORAL_TABLET | Freq: Two times a day (BID) | ORAL | Status: DC
  Administered 2018-02-02 – 2018-02-04 (×4): 4000 mg via ORAL
  Filled 2018-02-02 (×3): qty 5

## 2018-02-02 MED ORDER — FENTANYL CITRATE (PF) 100 MCG/2ML IJ SOLN
INTRAMUSCULAR | Status: DC | PRN
  Administered 2018-02-02 (×5): 50 ug via INTRAVENOUS

## 2018-02-02 MED ORDER — INSULIN GLARGINE 100 UNIT/ML ~~LOC~~ SOLN
10.0000 [IU] | Freq: Every day | SUBCUTANEOUS | Status: DC
  Administered 2018-02-02 – 2018-02-03 (×2): 10 [IU] via SUBCUTANEOUS
  Filled 2018-02-02 (×2): qty 0.1

## 2018-02-02 MED ORDER — HYDROMORPHONE HCL 2 MG/ML IJ SOLN: 1.0000 mg | INTRAMUSCULAR | Status: DC | PRN

## 2018-02-02 MED ORDER — CALCITRIOL 0.5 MCG PO CAPS
ORAL_CAPSULE | ORAL | Status: AC
  Filled 2018-02-02: qty 3

## 2018-02-02 MED ORDER — MIDAZOLAM HCL 5 MG/5ML IJ SOLN
INTRAMUSCULAR | Status: DC | PRN
  Administered 2018-02-02: 2 mg via INTRAVENOUS
  Administered 2018-02-02 (×2): 1 mg via INTRAVENOUS

## 2018-02-02 MED ORDER — BUPIVACAINE HCL 0.5 % IJ SOLN
INTRAMUSCULAR | Status: DC | PRN
  Administered 2018-02-02: 10 mL

## 2018-02-02 MED ORDER — LIDOCAINE-PRILOCAINE 2.5-2.5 % EX CREA: 1.0000 "application " | TOPICAL_CREAM | CUTANEOUS | Status: DC | PRN

## 2018-02-02 MED ORDER — DARBEPOETIN ALFA 40 MCG/0.4ML IJ SOSY
40.0000 ug | PREFILLED_SYRINGE | INTRAMUSCULAR | Status: DC
  Administered 2018-02-02: 40 ug via INTRAVENOUS

## 2018-02-02 MED ORDER — CALCITRIOL 0.25 MCG PO CAPS
ORAL_CAPSULE | ORAL | Status: AC
  Filled 2018-02-02: qty 1

## 2018-02-02 MED ORDER — INSULIN ASPART 100 UNIT/ML ~~LOC~~ SOLN
10.0000 [IU] | Freq: Once | SUBCUTANEOUS | Status: AC
  Administered 2018-02-02: 10 [IU] via SUBCUTANEOUS

## 2018-02-02 MED ORDER — HEPARIN SODIUM (PORCINE) 1000 UNIT/ML DIALYSIS
1000.0000 [IU] | INTRAMUSCULAR | Status: DC | PRN
  Filled 2018-02-02: qty 1

## 2018-02-02 MED ORDER — ONDANSETRON HCL 4 MG/2ML IJ SOLN
INTRAMUSCULAR | Status: DC | PRN
  Administered 2018-02-02: 4 mg via INTRAVENOUS

## 2018-02-02 MED ORDER — LACTATED RINGERS IV SOLN: INTRAVENOUS | Status: DC

## 2018-02-02 MED ORDER — CINACALCET HCL 30 MG PO TABS
30.0000 mg | ORAL_TABLET | Freq: Every day | ORAL | Status: DC
  Administered 2018-02-02 – 2018-02-05 (×4): 30 mg via ORAL
  Filled 2018-02-02 (×3): qty 1

## 2018-02-02 MED ORDER — BUPIVACAINE HCL (PF) 0.5 % IJ SOLN
INTRAMUSCULAR | Status: AC
  Filled 2018-02-02: qty 30

## 2018-02-02 MED ORDER — INSULIN GLARGINE 100 UNIT/ML ~~LOC~~ SOLN: 10.0000 [IU] | Freq: Every day | SUBCUTANEOUS | Status: DC

## 2018-02-02 SURGICAL SUPPLY — 37 items
BANDAGE ACE 4X5 VEL STRL LF (GAUZE/BANDAGES/DRESSINGS) ×4 IMPLANT
BLADE LONG MED 31MMX9MM (MISCELLANEOUS) ×1
BLADE LONG MED 31X9 (MISCELLANEOUS) ×3 IMPLANT
BNDG GAUZE ELAST 4 BULKY (GAUZE/BANDAGES/DRESSINGS) ×4 IMPLANT
CONT SPEC STER OR (MISCELLANEOUS) ×16 IMPLANT
COVER SURGICAL LIGHT HANDLE (MISCELLANEOUS) ×4 IMPLANT
DRSG EMULSION OIL 3X3 NADH (GAUZE/BANDAGES/DRESSINGS) ×4 IMPLANT
DRSG VAC ATS SM SENSATRAC (GAUZE/BANDAGES/DRESSINGS) ×4 IMPLANT
ELECT CAUTERY BLADE 6.4 (BLADE) ×4 IMPLANT
ELECT REM PT RETURN 9FT ADLT (ELECTROSURGICAL) ×4
ELECTRODE REM PT RTRN 9FT ADLT (ELECTROSURGICAL) ×2 IMPLANT
GAUZE SPONGE 4X4 12PLY STRL (GAUZE/BANDAGES/DRESSINGS) ×4 IMPLANT
GAUZE SPONGE 4X4 12PLY STRL LF (GAUZE/BANDAGES/DRESSINGS) ×4 IMPLANT
GLOVE BIO SURGEON STRL SZ7.5 (GLOVE) ×4 IMPLANT
GLOVE BIOGEL PI IND STRL 8 (GLOVE) ×2 IMPLANT
GLOVE BIOGEL PI INDICATOR 8 (GLOVE) ×2
GOWN STRL REUS W/ TWL LRG LVL3 (GOWN DISPOSABLE) ×2 IMPLANT
GOWN STRL REUS W/ TWL XL LVL3 (GOWN DISPOSABLE) ×2 IMPLANT
GOWN STRL REUS W/TWL LRG LVL3 (GOWN DISPOSABLE) ×2
GOWN STRL REUS W/TWL XL LVL3 (GOWN DISPOSABLE) ×2
KIT BASIN OR (CUSTOM PROCEDURE TRAY) ×4 IMPLANT
KIT DRSG PREVENA PLUS 7DAY 125 (MISCELLANEOUS) ×4 IMPLANT
KIT TURNOVER KIT B (KITS) ×4 IMPLANT
NEEDLE HYPO 25GX1X1/2 BEV (NEEDLE) ×4 IMPLANT
NS IRRIG 1000ML POUR BTL (IV SOLUTION) ×4 IMPLANT
PACK ORTHO EXTREMITY (CUSTOM PROCEDURE TRAY) ×4 IMPLANT
PAD ARMBOARD 7.5X6 YLW CONV (MISCELLANEOUS) ×4 IMPLANT
SOL PREP POV-IOD 4OZ 10% (MISCELLANEOUS) ×4 IMPLANT
SPONGE LAP 18X18 RF (DISPOSABLE) ×4 IMPLANT
STAPLER VISISTAT 35W (STAPLE) ×4 IMPLANT
SUT ETHILON 3 0 PS 1 (SUTURE) ×8 IMPLANT
SYR CONTROL 10ML LL (SYRINGE) ×4 IMPLANT
TOWEL OR 17X26 10 PK STRL BLUE (TOWEL DISPOSABLE) ×4 IMPLANT
TUBE CONNECTING 12'X1/4 (SUCTIONS) ×1
TUBE CONNECTING 12X1/4 (SUCTIONS) ×3 IMPLANT
TUBE IRRIGATION SET MISONIX (TUBING) ×4 IMPLANT
YANKAUER SUCT BULB TIP NO VENT (SUCTIONS) ×4 IMPLANT

## 2018-02-02 NOTE — Progress Notes (Signed)
0435 pt to MRI, arrived back to floor at 0530

## 2018-02-02 NOTE — H&P (Signed)
Internal Medicine Attending Admission Note Date: 02/02/2018  Patient name: [name] Medical record number: [id number] Date of birth: [birth year] Age: 37 y.o. Gender: male  I saw and evaluated the patient. I reviewed the resident's note and I agree with the resident's findings and plan as documented in the resident's note.  Chief Complaint(s): Right foot wound  History - key components related to admission:  [name] is a 37 year old man with a history of end-stage renal disease on hemodialysis, type 1 diabetes complicated by diabetic foot ulcers and gastroparesis, and hypertension who presents to the emergency department at the recommendation of his podiatrist for evaluation of his wound, initiation of IV antibiotics, and admission for amputation. He is followed closely by [name] of podiatry and was seen in the clinic on May 2 where he was noted to have osteomyelitis. It was recommended that he go to the emergency department for admission for surgical debridement but deferred the presentation until today. He denies any fevers, shakes, or chills. Other than anxiety about the procedure he is without specific complaints.  Physical Exam - key components related to admission:  Vitals:   02/02/18 1632 02/02/18 1647 02/02/18 1702 02/02/18 1717  BP: 106/76 103/73 (!) 161/91 (!) 163/90  Pulse: 77 76 78 78  Resp: '10 14 10 11  ' Temp:      TempSrc:      SpO2: 100% 100% 100% 100%  Weight:       Gen.: Well-developed, well-nourished, man lying comfortably in bed undergoing hemodialysis in no acute distress. He has multiple tattoos over his upper torso, arms, and neck. Right foot: Longitudinal ulceration along the plantar aspect at the metatarsal heads and neuropathic ulcer on the lateral aspect,of the right foot.  Lab results:  Basic Metabolic Panel: Recent Labs    01/31/18 2248 02/02/18 0731  NA 136 132*  K 4.3 4.9  CL 91* 93*  CO2 32 27  GLUCOSE 68 146*  BUN 23* 43*  CREATININE 6.26*  8.99*  CALCIUM 9.1 8.7*  PHOS  --  4.7*   Liver Function Tests: Recent Labs    01/31/18 2248 02/02/18 0731  AST 15  --   ALT 10*  --   ALKPHOS 120  --   BILITOT 0.5  --   PROT 8.0  --   ALBUMIN 3.0* 2.4*   CBC: Recent Labs    01/31/18 2248 02/02/18 0731  WBC 12.9* 10.0  NEUTROABS 9.8*  --   HGB 9.9* 9.6*  HCT 31.4* 31.0*  MCV 91.3 89.6  PLT 295 261   CBG: Recent Labs    02/01/18 1411 02/01/18 1713 02/01/18 2250 02/02/18 0740 02/02/18 1408 02/02/18 1605  GLUCAP 329* 286* 224* 131* 161* 168*   Misc. Labs:  Blood cultures 2 no growth to date Wound cultures pending ESR 79 C-reactive protein 8.0  Imaging results:  Mr Foot Right Wo Contrast  Result Date: 02/02/2018 CLINICAL DATA:  Diabetic foot ulcer along the lateral aspect the foot. Preoperative evaluation prior to surgery. EXAM: MRI OF THE RIGHT FOREFOOT WITHOUT CONTRAST TECHNIQUE: Multiplanar, multisequence MR imaging of the right forefoot was performed. No intravenous contrast was administered. COMPARISON:  None. FINDINGS: Bones/Joint/Cartilage Soft tissue ulcer overlying the fifth MTP joint. Bone destruction of fifth metatarsal head with severe marrow edema extending into the shaft. Bone destruction of the base of the fifth proximal phalanx with severe marrow edema throughout the fifth proximal phalanx. Mild reactive marrow edema in the fifth middle phalanx. Mild marrow edema in  the fourth metatarsal head without cortical destruction likely reflecting reactive edema secondary to adjacent inflammation versus less likely osteomyelitis. Mild marrow edema in the medial and lateral hallux sesamoids as can be seen with mild sesamoiditis. No other marrow signal abnormality. No acute fracture. Mild osteoarthritis of the first TMT joint. Mild osteoarthritis of the talonavicular joint. Ligaments Lisfranc ligament is intact.  Collateral ligaments are intact. Muscles and Tendons Mild T2 hyperintense signal throughout the plantar  musculature likely neurogenic. Flexor, peroneal and extensor compartment tendons are intact. Soft tissues No other soft tissue abnormality.  No fluid collection or hematoma. IMPRESSION: 1. Soft tissue ulcer overlying the fifth MTP joint. Osteomyelitis of the fifth metatarsal head and fifth proximal phalanx with severe marrow edema. 2. Mild reactive marrow edema without bone destruction in the fifth middle phalanx. Mild marrow edema in the fourth metatarsal head without cortical destruction likely reflecting reactive edema secondary to adjacent inflammation versus less likely osteomyelitis. Electronically Signed   By: Kathreen Devoid   On: 02/02/2018 08:05   Dg Foot Complete Right  Result Date: 01/31/2018 CLINICAL DATA:  37 year old male with right foot pain and edema. EXAM: RIGHT FOOT COMPLETE - 3+ VIEW COMPARISON:  Right foot radiograph dated 01/13/2018 FINDINGS: There is a mildly displaced fracture of the distal fifth metatarsal with erosive changes of the bone. There is also a mildly displaced fracture of the base of the proximal phalanx of the fifth toe with associated bone erosion. The head of the fifth metatarsal is displaced laterally and there is dislocation at the fifth MTP joint. Findings suspicious for pathologic fracture secondary to underlying infection/osteomyelitis. Clinical correlation is recommended. MRI may provide better evaluation. No other acute fracture or dislocation identified. The bones are somewhat osteopenic for age. There is diffuse subcutaneous edema and soft tissue thickening primarily over the dorsum of the foot and forefoot. Vascular calcifications noted. IMPRESSION: Displaced fractures of the distal fifth metatarsal and base of the proximal phalanx of the fifth digit likely secondary to underlying osteomyelitis. There is associated dislocation at the fifth MTP joint. MRI may provide better evaluation if clinically indicated. Electronically Signed   By: Anner Crete M.D.   On:  01/31/2018 23:16   Assessment & Plan by Problem:  [name] is a 37 year old man with a history of end-stage renal disease on hemodialysis, type 1 diabetes complicated by diabetic foot ulcers and gastroparesis, and hypertension who presents to the emergency department at the recommendation of his podiatrist for evaluation of his wound, initiation of IV antibiotics, and admission for amputation. He underwent amputation of the right fifth metatarsal ray this afternoon.  1) Status post right fifth metatarsal ray amputation: We will discuss with podiatry the length of time they wish him to remain on antibiotics and any postoperative physical therapy that is necessary. Once this is clarified we will proceed with these recommendations.  2) End-stage renal disease requiring hemodialysis: He underwent hemodialysis today. If he remains in the hospital he will continue on his normal hemodialysis schedule. We appreciate nephrology's assistance during his hospitalization.  3) Disposition: As per podiatry's recommendations regarding discharge and postoperative physical therapy if any.

## 2018-02-02 NOTE — Transfer of Care (Signed)
Immediate Anesthesia Transfer of Care Note  Patient: [name]  Procedure(s) Performed: RIGHT FIFTH TOE AND METATARSAL AMPUTATION. Filetted toe flap metatarsal resection. Debridement Plantar Foot wound (Right Foot) APPLICATION OF WOUND VAC  Right Foot (Foot)  Patient Location: PACU  Anesthesia Type:MAC  Level of Consciousness: drowsy  Airway & Oxygen Therapy: Patient Spontanous Breathing and Patient connected to nasal cannula oxygen  Post-op Assessment: Report given to RN and Post -op Vital signs reviewed and stable  Post vital signs: Reviewed and stable  Last Vitals:  Vitals Value Taken Time  BP 107/70 02/02/2018  4:02 PM  Temp    Pulse 84 02/02/2018  4:03 PM  Resp 11 02/02/2018  4:03 PM  SpO2 100 % 02/02/2018  4:03 PM  Vitals shown include unvalidated device data.  Last Pain:  Vitals:   02/02/18 1320  TempSrc: Oral  PainSc: 0-No pain         Complications: No apparent anesthesia complications

## 2018-02-02 NOTE — Progress Notes (Signed)
Subjective:  Mr. Crays was seen in HD this morning. He was lying comfortably and stated that he was having some pain in his RLE that was relieved with dilaudid. He is aware of the need for amputation. We had a long discussion today about his stressors and the importance of this amputation in his life. He asked about whether there was any other option for him aside from amputation, as he recognizes that this would represent a major change in his life. He told us about him watching his father go through dialysis when he was younger and how difficult that was and how different the experience was. 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 He told us about the need for some self-reflection to be able to personally deal with these issues. We discussed with him that Dr. March Rummage has been doing a great job managing his infection outpatient, however with his diabetes and the wound as it currently stands, we advised that we do agree about the need for amputation. We also discussed that we will place an order for visitors to speak to RN/staff first prior to entering his room so that he can have privacy if desired.  Objective:  Vital signs in last 24 hours: Vitals:   02/01/18 1818 02/01/18 1917 02/01/18 2255 02/02/18 0425  BP: (!) 144/87 (!) 150/104 (!) 142/84 (!) 156/85  Pulse: 94 88 89 84  Resp: '18 19 20 18  ' Temp: 98.2 F (36.8 C) 98.9 F (37.2 C) (!) 97.5 F (36.4 C) 98.4 F (36.9 C)  TempSrc: Oral Oral Oral Oral  SpO2: 100% 100% 100% 100%   GEN: Lying in bed in NAD, HD access from RUE CV: NR & RR, no m/r/g PULM: CTAB, no wheezes or rales in anterior lung fields MSK: Right foot ulcers between 1st and 2nd toe interspace and 5th metatarsal  Assessment/Plan:  Principal Problem:   Osteomyelitis ([name]) Active Problems:   Diabetes mellitus type 1 ([name])   ESRD (end stage renal disease)  on dialysis ([place])   Essential (primary) hypertension   Ulcerated, foot, right, limited to breakdown of skin [facility])  Mr. Rybacki is a 37yo male with PMH of type 1 diabetes, ESRD on HD MWF, HTN, and diabetic foot ulcer/osteomyelitis of right lower extremity who presents for management of diabetic foot ulcer and need for amputation. He has been followed closely by Podiatry as an outpatient in the last several months. Plan for amputation with Dr. March Rummage today - extent of amputation will be determined by MRI, final read pending. He is afebrile and HDS, thus antibiotics are currently being held, although he did get a dose of vanc/cefepime in the ED.  Osteomyelitis of right 5th metatarsal head Diabetic foot ulcer of right first and second metatarsal area HDS and afebrile. Has been followed closely by Podiatry as an outpatient for recurrent debridements. He was on ciprofloxacin and clindamycin prescribed on most recent visit to Podiatry on 5/2. He was given a dose of vanc/cefepime in the ED, however given patient's clinical stability, we have held his antibiotics. ESR and CRP elevated. Plan for OR today at 1pm for amputation, extent to be determined by MRI findings. - Podiatry consulted; appreciate their assistance - F/u MRI findings - OR today at 1pm for amputation - F/u intraoperative cultures - Holding antibiotics - Monitor fever curve - Wound consult - Oxy IR  50m q6h PRN for moderate-severe pain - Resume diet after surgery - F/u BCx 5/7 -> NG x24h  Type 1 DM Patient's home medications listed as Lantus 20u QHS and Novolog SSI. Patient reported he was only taking 6 units of lantus at home. Most recent BG have been elevated. Most recent A1c in 09/2017 was 6.2 - Continue Lantus 6u QHS - SSI-S TID WC - CBG monitoring  ESRD on HD MWF Reports adherence to dialysis - Nephrology consulted for inpatient HD; appreciate their assistance - Continue renvela and sensipar - HD today  Hx of HTN BP 138/72.  Home regimen includes amlodipine 166mdaily and losartan 5017maily - Holding home losartan - Continue home amlodipine 84m24mily  Anemia Likely anemia of chronic renal disease with Hb near baseline. Hb 9.6 this AM - CBC in AM  Dispo: Anticipated discharge in approximately 2-3 day(s).  HuanColbert Ewing 02/02/2018, 6:44 AM Pager: P [phone]

## 2018-02-02 NOTE — Progress Notes (Signed)
Internal Medicine Attending  Date: 02/02/2018  Patient name: [name] Medical record number: [id number] Date of birth: 04/15/1981 Age: 37 y.o. Gender: male  I saw and evaluated the patient. I reviewed the resident's note by Dr. Ronalee Red and I agree with the resident's findings and plans as documented in her progress note.  Please see my H&P dated 02/02/2018 for the specifics of my evaluation, assessment, and plan from earlier in the day.

## 2018-02-02 NOTE — Progress Notes (Signed)
Reminded pt not to eat or drink anything at midnight. Pt insisted to eat his sandwhich at this time. Will notify MD.

## 2018-02-02 NOTE — Consult Note (Signed)
[facility] Renal Consultation Note    Indication for Consultation:  Management of ESRD/hemodialysis; anemia, hypertension/volume and secondary hyperparathyroidism PCP:  HPI: [name] is a 37 y.o. male with ESRD on hemodialysis MWF at [facility] PMH T1DM, HTN, diabetic gastroparesis, AOCD, SHPT. Last HD 02/02/2018 got close to EDW.   Patient presented to ED with C/O ulcer R foot. He has been followed as OP by Dr. March Rummage. He was seen in Dr. Eleanora Neighbor office last Thursday and was supposed to be direct admit to hospital for antibiotics and surgery for osteomyelitis but patient refused. Upon arrival to ED, labs were found to be unremarkable except for WBC 10.0. R foot xray showed Displaced fractures of the distal fifth metatarsal and base of the proximal phalanx of the fifth digit likely secondary to underlyingosteomyelitis. There is associated dislocation at the fifth MTP joint. MRI confirmed Osteomyelitis of the fifth metatarsal head and fifth proximal phalanx with severe marrow edema. He is scheduled R 5th toe and metarsal amputation later today. He is being seen on HD. He is talking about everything except amputation of his foot (IE: diarrhea, scheduling at HD center). He does not complain of pain.   Past Medical History:  Diagnosis Date  . Diabetes mellitus without complication ([place])   . Diabetic gastroparesis ([place])   . Dialysis patient ([place])   . Hypertension   . Renal disorder    Dialysis  . Sepsis [facility])    Past Surgical History:  Procedure Laterality Date  . AV FISTULA PLACEMENT     left arm.  . AV FISTULA PLACEMENT Right 12/22/2016   Procedure: INSERTION OF ARTERIOVENOUS (AV) GORE-TEX GRAFT ARM;  Surgeon: Elam Dutch, MD;  Location: [place];  Service: Vascular;  Laterality: Right;  . EYE SURGERY    . I&D EXTREMITY Right 10/31/2017   Procedure: IRRIGATION AND DEBRIDEMENT RIGHT FOOT;  Surgeon: Evelina Bucy, DPM;  Location: [place];  Service: Podiatry;   Laterality: Right;  . INSERTION OF DIALYSIS CATHETER     Right subclavian  . UPPER EXTREMITY VENOGRAPHY N/A 11/16/2016   Procedure: Upper Extremity Venography - Right Central;  Surgeon: Elam Dutch, MD;  Location: [place] [facility];  Service: Cardiovascular;  Laterality: N/A;   Family History  Problem Relation Age of Onset  . Diabetes Mellitus II Unknown    Social History:  reports that he has never smoked. He has never used smokeless tobacco. He reports that he does not drink alcohol or use drugs. Allergies  Allergen Reactions  . No Known Allergies    Prior to Admission medications   Medication Sig Start Date End Date Taking? Authorizing Provider  acetaminophen (TYLENOL) 325 MG tablet Take 2 tablets (650 mg total) by mouth every 6 (six) hours as needed for mild pain or headache (pain/headache). Can restart this after you are done with Oxycodone/Tylenol 11/10/17  Yes Domenic Polite, MD  amLODipine (NORVASC) 10 MG tablet Take 10 mg by mouth daily.   Yes [provider]  cinacalcet (SENSIPAR) 30 MG tablet Take 30 mg by mouth daily with supper.   Yes [provider]  ciprofloxacin (CIPRO) 250 MG tablet Take 1 tablet (250 mg total) by mouth 2 (two) times daily. 01/27/18  Yes Evelina Bucy, DPM  clindamycin (CLEOCIN) 300 MG capsule Take 1 capsule (300 mg total) by mouth 2 (two) times daily. 01/27/18  Yes Evelina Bucy, DPM  clonazePAM (KLONOPIN) 0.5 MG tablet Take 0.5 mg by mouth daily as  needed for anxiety (sleep).  07/21/17  Yes [provider]  collagenase (SANTYL) ointment Apply 1 application topically daily. 11/24/17  Yes Evelina Bucy, DPM  insulin aspart (NOVOLOG) 100 UNIT/ML injection Inject 5 Units into the skin 2 (two) times daily with a meal.    Yes [provider]  insulin glargine (LANTUS) 100 UNIT/ML injection Inject 0.2 mLs (20 Units total) into the skin at bedtime. Patient taking differently: Inject 6 Units into the skin at bedtime.   11/03/17  Yes Domenic Polite, MD  losartan (COZAAR) 50 MG tablet Take 50 mg by mouth daily. 09/27/17  Yes [provider]  ondansetron (ZOFRAN ODT) 4 MG disintegrating tablet Take 1 tablet (4 mg total) by mouth every 8 (eight) hours as needed for nausea or vomiting. 11/23/17  Yes Ward, Ozella Almond, PA-C  sevelamer carbonate (RENVELA) 800 MG tablet Take 2,400-3,200 mg by mouth See admin instructions. Take 3 - 4 tablets (2400 mg -3200 mg) by mouth two times daily with meals   Yes [provider]   Current Facility-Administered Medications  Medication Dose Route Frequency Provider Last Rate Last Dose  . 0.9 %  sodium chloride infusion  100 mL Intravenous PRN Valentina Gu, NP      . 0.9 %  sodium chloride infusion  100 mL Intravenous PRN Valentina Gu, NP      . acetaminophen (TYLENOL) tablet 650 mg  650 mg Oral Q6H PRN Jule Ser, DO       Or  . acetaminophen (TYLENOL) suppository 650 mg  650 mg Rectal Q6H PRN Jule Ser, DO      . alteplase (CATHFLO ACTIVASE) injection 2 mg  2 mg Intracatheter Once PRN Valentina Gu, NP      . amLODipine (NORVASC) tablet 10 mg  10 mg Oral Daily Jule Ser, DO   10 mg at 02/01/18 1755  . cinacalcet (SENSIPAR) tablet 30 mg  30 mg Oral Q supper Jule Ser, DO   30 mg at 02/01/18 1758  . heparin injection 1,000 Units  1,000 Units Dialysis PRN Valentina Gu, NP      . heparin injection 9,000 Units  9,000 Units Dialysis Once in dialysis Valentina Gu, NP      . insulin aspart (novoLOG) injection 0-9 Units  0-9 Units Subcutaneous TID WC Jule Ser, DO   5 Units at 02/01/18 1756  . insulin glargine (LANTUS) injection 6 Units  6 Units Subcutaneous QHS Colbert Ewing, MD   6 Units at 02/01/18 2237  . lidocaine (PF) (XYLOCAINE) 1 % injection 5 mL  5 mL Intradermal PRN Valentina Gu, NP      . lidocaine-prilocaine (EMLA) cream 1 application  1 application Topical PRN Valentina Gu, NP       . ondansetron [facility]) tablet 4 mg  4 mg Oral Q6H PRN Jule Ser, DO       Or  . ondansetron [facility]) injection 4 mg  4 mg Intravenous Q6H PRN Jule Ser, DO      . oxyCODONE (Oxy IR/ROXICODONE) immediate release tablet 5 mg  5 mg Oral Q6H PRN Colbert Ewing, MD   5 mg at 02/01/18 2052  . pentafluoroprop-tetrafluoroeth (GEBAUERS) aerosol 1 application  1 application Topical PRN Valentina Gu, NP      . sevelamer carbonate (RENVELA) tablet 4,000 mg  4,000 mg Oral BID WC Valentina Gu, NP      . sodium chloride flush (NS) 0.9 % injection 3 mL  3 mL Intravenous Q12H Jule Ser, DO   3 mL at 02/01/18 2357   Labs: Basic Metabolic Panel: Recent Labs  Lab 01/31/18 2248 02/02/18 0731  NA 136 132*  K 4.3 4.9  CL 91* 93*  CO2 32 27  GLUCOSE 68 146*  BUN 23* 43*  CREATININE 6.26* 8.99*  CALCIUM 9.1 8.7*  PHOS  --  4.7*   Liver Function Tests: Recent Labs  Lab 01/31/18 2248 02/02/18 0731  AST 15  --   ALT 10*  --   ALKPHOS 120  --   BILITOT 0.5  --   PROT 8.0  --   ALBUMIN 3.0* 2.4*   No results for input(s): LIPASE, AMYLASE in the last 168 hours. No results for input(s): AMMONIA in the last 168 hours. CBC: Recent Labs  Lab 01/31/18 2248 02/02/18 0731  WBC 12.9* 10.0  NEUTROABS 9.8*  --   HGB 9.9* 9.6*  HCT 31.4* 31.0*  MCV 91.3 89.6  PLT 295 261   Cardiac Enzymes: No results for input(s): CKTOTAL, CKMB, CKMBINDEX, TROPONINI in the last 168 hours. CBG: Recent Labs  Lab 02/01/18 0729 02/01/18 1411 02/01/18 1713 02/01/18 2250 02/02/18 0740  GLUCAP 170* 329* 286* 224* 131*   Iron Studies: No results for input(s): IRON, TIBC, TRANSFERRIN, FERRITIN in the last 72 hours. Studies/Results: Mr Foot Right Wo Contrast  Result Date: 02/02/2018 CLINICAL DATA:  Diabetic foot ulcer along the lateral aspect the foot. Preoperative evaluation prior to surgery. EXAM: MRI OF THE RIGHT FOREFOOT WITHOUT CONTRAST TECHNIQUE: Multiplanar, multisequence  MR imaging of the right forefoot was performed. No intravenous contrast was administered. COMPARISON:  None. FINDINGS: Bones/Joint/Cartilage Soft tissue ulcer overlying the fifth MTP joint. Bone destruction of fifth metatarsal head with severe marrow edema extending into the shaft. Bone destruction of the base of the fifth proximal phalanx with severe marrow edema throughout the fifth proximal phalanx. Mild reactive marrow edema in the fifth middle phalanx. Mild marrow edema in the fourth metatarsal head without cortical destruction likely reflecting reactive edema secondary to adjacent inflammation versus less likely osteomyelitis. Mild marrow edema in the medial and lateral hallux sesamoids as can be seen with mild sesamoiditis. No other marrow signal abnormality. No acute fracture. Mild osteoarthritis of the first TMT joint. Mild osteoarthritis of the talonavicular joint. Ligaments Lisfranc ligament is intact.  Collateral ligaments are intact. Muscles and Tendons Mild T2 hyperintense signal throughout the plantar musculature likely neurogenic. Flexor, peroneal and extensor compartment tendons are intact. Soft tissues No other soft tissue abnormality.  No fluid collection or hematoma. IMPRESSION: 1. Soft tissue ulcer overlying the fifth MTP joint. Osteomyelitis of the fifth metatarsal head and fifth proximal phalanx with severe marrow edema. 2. Mild reactive marrow edema without bone destruction in the fifth middle phalanx. Mild marrow edema in the fourth metatarsal head without cortical destruction likely reflecting reactive edema secondary to adjacent inflammation versus less likely osteomyelitis. Electronically Signed   By: Kathreen Devoid   On: 02/02/2018 08:05   Dg Foot Complete Right  Result Date: 01/31/2018 CLINICAL DATA:  37 year old male with right foot pain and edema. EXAM: RIGHT FOOT COMPLETE - 3+ VIEW COMPARISON:  Right foot radiograph dated 01/13/2018 FINDINGS: There is a mildly displaced fracture of  the distal fifth metatarsal with erosive changes of the bone. There is also a mildly displaced fracture of the base of the proximal phalanx of the fifth toe with associated bone erosion. The head of the fifth metatarsal is displaced laterally and there is  dislocation at the fifth MTP joint. Findings suspicious for pathologic fracture secondary to underlying infection/osteomyelitis. Clinical correlation is recommended. MRI may provide better evaluation. No other acute fracture or dislocation identified. The bones are somewhat osteopenic for age. There is diffuse subcutaneous edema and soft tissue thickening primarily over the dorsum of the foot and forefoot. Vascular calcifications noted. IMPRESSION: Displaced fractures of the distal fifth metatarsal and base of the proximal phalanx of the fifth digit likely secondary to underlying osteomyelitis. There is associated dislocation at the fifth MTP joint. MRI may provide better evaluation if clinically indicated. Electronically Signed   By: Anner Crete M.D.   On: 01/31/2018 23:16    ROS: As per HPI otherwise negative.    Physical Exam: Vitals:   02/02/18 0425 02/02/18 0920 02/02/18 0930 02/02/18 1000  BP: (!) 156/85 (!) 195/106 (!) 172/100 (!) 174/82  Pulse: 84 78 84 79  Resp: 18 17 16 17   Temp: 98.4 F (36.9 C) 98.1 F (36.7 C) 98 F (36.7 C)   TempSrc: Oral Oral Oral   SpO2: 100% 100% 100%   Weight:  78 kg (171 lb 15.3 oz)       General: Well developed, well nourished, in no acute distress. Head: Normocephalic, atraumatic, sclera non-icteric, mucus membranes are moist Neck: Supple. JVD not elevated. Lungs: Clear bilaterally to auscultation without wheezes, rales, or rhonchi. Breathing is unlabored. Heart: RRR with S1 S2. No murmurs, rubs, or gallops appreciated. Abdomen: Soft, non-tender, non-distended with normoactive bowel sounds. No rebound/guarding. No obvious abdominal masses. M-S:  Strength and tone appear normal for age. Lower  extremities: Trace RLE edema. Open wounds without drsgs: Ulceration posterior surface 1st and 2nd metatarsal, Ulceration plantar surface of 5th metatarsal/lateral aspect with bone visible. Foul odor.  Neuro: Alert and oriented X 3. Moves all extremities spontaneously. Psych:  Responds to questions appropriately with a normal affect. Dialysis Access: RUA AVG cannulated at present  Dialysis Orders:[facility] MWF 4 hrs 180 NRe 400/800 73.5 kg 2.0 K/ 2.0 Ca  -Heparin 9000 units IV TIW -Mircera 75 mcg IV q 2 weeks (New dosing-hasn't rec'd yet.)  -Calcitriol 1.75 mcg PO TIW  Assessment/Plan: 1.  Osteomyelitis R Foot. For R 5th toe and metatarsal amputation today. Has rec'd Vanc/Cefepime. Per primary/Dr. March Rummage.  2.  ESRD -  MWF HD today on schedule.  3.  Hypertension/volume  -Hypertensive at present. UFG 5.5 tolerating well. Losartan 50 mg on OP med list however on amlodipine 10 mg PO here. (amlodipine not on OP med list).  4.  Anemia  - HGB 9.6 give Aranesp 40 mcg IV with HD today.  5.  Metabolic bone disease -  Ca 8.7 C Ca 10. Phos 4.7. Continue binders, senispar, VDRA.  6.  Nutrition - NPO for surgery 7.  DM: per primary  Rita H. Owens Shark, NP-C 02/02/2018, 11:21 AM  [place]

## 2018-02-02 NOTE — Brief Op Note (Signed)
02/02/2018  3:55 PM  PATIENT:  [name]  37 y.o. male  PRE-OPERATIVE DIAGNOSIS:  RIGHT FOOT INFECTION  POST-OPERATIVE DIAGNOSIS:  Right Foot Infection  PROCEDURE:  Procedure(s): RIGHT FIFTH TOE AND METATARSAL AMPUTATION. Filetted toe flap metatarsal resection. Debridement Plantar Foot wound (Right) APPLICATION OF WOUND VAC  Right Foot  SURGEON:  Surgeon(s) and Role:    * Evelina Bucy, DPM - Primary  PHYSICIAN ASSISTANT:   ASSISTANTS: none   ANESTHESIA:   MAC  EBL:  50 mL   BLOOD ADMINISTERED:none  DRAINS: [facility]   LOCAL MEDICATIONS USED:  MARCAINE    and Amount: 10 ml  SPECIMEN:  Source of Specimen:  bone and soft tissue R foot  DISPOSITION OF SPECIMEN:  Path and Micro  COUNTS:  YES  TOURNIQUET:  * No tourniquets in log *  DICTATION: .Note written in EPIC  PLAN OF CARE: Transfer to floor  PATIENT DISPOSITION:  PACU - hemodynamically stable.   Delay start of Pharmacological VTE agent (>24hrs) due to surgical blood loss or risk of bleeding: not applicable

## 2018-02-02 NOTE — Anesthesia Postprocedure Evaluation (Signed)
Anesthesia Post Note  Patient: [name]  Procedure(s) Performed: RIGHT FIFTH TOE AND METATARSAL AMPUTATION. Filetted toe flap metatarsal resection. Debridement Plantar Foot wound (Right Foot) APPLICATION OF WOUND VAC  Right Foot (Foot)     Patient location during evaluation: PACU Anesthesia Type: MAC Level of consciousness: awake and alert Pain management: pain level controlled Vital Signs Assessment: post-procedure vital signs reviewed and stable Respiratory status: spontaneous breathing, nonlabored ventilation, respiratory function stable and patient connected to nasal cannula oxygen Cardiovascular status: stable and blood pressure returned to baseline Postop Assessment: no apparent nausea or vomiting Anesthetic complications: no    Last Vitals:  Vitals:   02/02/18 1702 02/02/18 1717  BP: (!) 161/91 (!) 163/90  Pulse: 78 78  Resp: 10 11  Temp:    SpO2: 100% 100%    Last Pain:  Vitals:   02/02/18 1702  TempSrc:   PainSc: 0-No pain                 Effie Berkshire

## 2018-02-02 NOTE — Progress Notes (Signed)
Patient's CBG 430. Notified MD via text page @ 2217. Awaiting return call or further orders.

## 2018-02-02 NOTE — Progress Notes (Signed)
  Subjective:  Patient ID: [name], male    DOB: 06/01/1981,  MRN: [id number]  Patient seen in pre-op. Just had dialysis. Patient appears tired. Understands plan for the OR. Patient appears rather dejected. Objective:   Vitals:   02/02/18 1200 02/02/18 1230  BP: (!) 142/89 121/76  Pulse: 88 96  Resp:    Temp:    SpO2:      General AA&O x3. Normal mood and affect.  Vascular R foot warm to touch.  Neurologic Epicritic sensation grossly diminished.  Dermatologic (Wound)  Plantar first interspace wound with hyperkeratosis, granular base. No erythema. No local warmth. Lateral 5th MPJ wound with necrotic wound base. Palpable gray bone present in the wound. Fibrotic material present. No purulence. Edema present R foot and lower leg. No ascending cellulitis.  Orthopedic: MMT 5/5 in dorsiflexion, plantarflexion, inversion, and eversion. Normal lower extremity joint ROM without pain or crepitus.   Assessment & Plan:  Patient was evaluated and treated and all questions answered.  Osteomyelitis R 5th Toe and Metatarsal -MRI reviewed. Osteomyelitis of the 5th metatarsal and proximal and middle phalanges of the toe. -Continue empiric abx. -Betadine WTD dressing applied. -Proceed with R 5th toe amputation, R 5th metatarsal resection, R plantar interspace wound debridement, possible application of wound VAC.

## 2018-02-02 NOTE — Discharge Summary (Addendum)
Name: [name] MRN: [id number] DOB: 1981/02/01 37 y.o. PCP: Patient, No Pcp Per  Date of Admission: 01/31/2018 10:22 PM Date of Discharge: 02/05/2018 Attending Physician: Oval Linsey, MD  Discharge Diagnosis: 1. 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 Right foot wound, s/p I&D on 5/8 3. Type 1 diabetes 4. Malfunction of AVG 5. 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 Can restart this after you are done with Oxycodone/Tylenol   amLODipine 10 MG tablet Commonly known as:  NORVASC Take 10 mg by mouth daily.   cinacalcet 30 MG tablet Commonly known as:  SENSIPAR Take 30 mg by mouth daily with supper.   clonazePAM 0.5 MG tablet Commonly known as:  KLONOPIN Take 0.5 mg by mouth daily as needed for anxiety (sleep).   collagenase ointment Commonly known as:  SANTYL Apply 1 application topically daily.   insulin aspart 100 UNIT/ML injection Commonly known as:  novoLOG Inject 5 Units into the skin 2 (two) times daily with a meal.   insulin glargine 100 UNIT/ML injection Commonly known as:  LANTUS Inject 0.1 mLs (10 Units total) into the skin at bedtime. What  changed:  how much to take   losartan 50 MG tablet Commonly known as:  COZAAR Take 50 mg by mouth daily.   metroNIDAZOLE 500 MG tablet Commonly known as:  FLAGYL Take 1 tablet (500 mg total) by mouth 3 (three) times daily for 14 days.   ondansetron 4 MG disintegrating tablet Commonly known as:  ZOFRAN ODT Take 1 tablet (4 mg total) by mouth every 8 (eight) hours as needed for nausea or vomiting.   oxyCODONE 5 MG immediate release tablet Commonly known as:  Oxy IR/ROXICODONE Take 1 tablet (5 mg total) by mouth every 6 (six) hours as needed for up to 7 days for moderate pain or severe pain.   sevelamer carbonate 800 MG tablet Commonly known as:  RENVELA Take 2,400-3,200 mg by mouth See admin instructions. Take 3 - 4 tablets (2400 mg -3200 mg) by mouth two times daily with meals   Vancomycin 750-5 MG/150ML-% Soln Commonly known as:  VANCOCIN Inject 150 mLs (750 mg total) into the vein every Monday, Wednesday, and Friday with hemodialysis for 14 days.       Disposition and follow-up:   Mr.[name] was discharged from [facility] in Stable condition.  At the hospital follow up visit please address:  1.  Osteomyelitis of right 5th metatarsal and toe; R foot wound - Please assess for healing and continued wound care. Surgical culture positive  for Staph aureus and skin flora (enterococcus, diphtheroid, coag neg staph, and possible anaerobe). Sensitive to vanc. Patient to receive IV vancomycin with HD for 2 weeks to be narrowed when sensitivities return. Also on PO flagyl 563m every 8 hours for 2 weeks - please ensure compliance. Stop date of antibiotics is 5/24. Patient set up for outpatient Podiatry f/u on 5/16.  2.  Nocturnal fecal incontinence - Patient reports this is a chronic problem. Please continue outpatient evaluation.  3.  Concern for undiagnosed bipolar/psychiatric disorder - Would recommend psych eval - First step is to establish with PCP.  4.  Labs /  imaging needed at time of follow-up: None  5.  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 Osteomyelitis of right 5th metatarsal and toe, s/p partial 5th ray amputation on 5/8 [facility]. He has been followed closely by Podiatry as an outpatient for recurrent debridements. He was on ciprofloxacin and clindamycin prescribed on most recent visit to Podiatry on 5/2 and declined admission at that time. He came to the ER and Podiatry was consulted for surgery. He was given a dose of vanc/cefepime in the ED, however given patient's clinical stability, antibiotics were held after his first dose. ESR and CRP were elevated. MRI was obtained which showed osteomyelitis of right 5th metatarsal. He underwent partial 5th ray amputation on 5/8. Empiric IV vancomycin and zosyn was started on 5/9. Surgical culture returned with Vanc sensitive Staph aureus and skin flora (enterococcus, diphtheroid, coag neg staph, and possible anaerobe). ID was consulted for antibiotic recs - zosyn was discontinued and plan is for IV vancomycin with HD and PO flagyl for 2 weeks (stop date is 5/24). BCx negative on discharge. Discharged with plan for close follow-up with Podiatry.  2. Right foot wound, s/p I&D Patient has chronic wound between 1st and 2nd interspace of right toes. He underwent I&D of this area on 5/8.  3. Type 1 diabetes Home medications listed as lantus 20u QHS and Novolog SSI, however patient reported he was only taking lantus 6 units at home. Most recent A1c in 09/2017  was 6.2. He was discharged on Lantus 10 units nightly and SSI.  4. ESRD on HD MWF, Clotted RUE AV graft Reports adherence to outpatient dialysis. Nephrology was consulted for inpatient dialysis. He was unable to complete his Friday 5/10 session due to clot of AV graft. IR was consulted for thrombectomy the following morning and he was able to complete HD on Saturday. He was discharged with plan to resume his typical schedule on Monday. Patient will receive outpatient IV vancomycin to be dosed with HD for 2 weeks.  5. Nocturnal fecal incontinence Patient reports nocturnal episodes of fecal incontinence with formed stools. He denies blood in his stool or diarrhea. He states he has seen GI in the past and completed stool samples, but is not sure of the result and was unable to return for follow up due to lack of transportation. Has never had a colonoscopy before. He expressed extreme anxiety over the thought of  getting a colonoscopy. Hb and electrolytes were wnl while inpatient. Continue outpatient eval.  6. ?Bipolar disorder Patient exhibited multiple symptoms of mania, including uninterrupted and pressured speech, irritability, flight of ideas, and distractibility. Would recommend follow up with psychiatric eval - first step is to set up with PCP.  Discharge Vitals:   BP (!) 166/76 (BP Location: Left Wrist)   Pulse 96   Temp 98.6 F (37 C) (Oral)   Resp 18   Wt 171 lb 4.8 oz (77.7 kg)   SpO2 100%   BMI 27.65 kg/m   Pertinent Labs, Studies, and Procedures:  CBC Latest Ref Rng & Units 02/05/2018 02/04/2018 02/02/2018  WBC 4.0 - 10.5 K/uL 11.6(H) 9.9 10.0  Hemoglobin 13.0 - 17.0 g/dL 9.4(L) 8.1(L) 9.6(L)  Hematocrit 39.0 - 52.0 % 29.6(L) 25.4(L) 31.0(L)  Platelets 150 - 400 K/uL 266 231 261   CMP Latest Ref Rng & Units 02/04/2018 02/02/2018 01/31/2018  Glucose 65 - 99 mg/dL 346(H) 146(H) 68  BUN 6 - 20 mg/dL 41(H) 43(H) 23(H)  Creatinine 0.61 - 1.24 mg/dL 9.76(H) 8.99(H) 6.26(H)  Sodium 135 - 145  mmol/L 130(L) 132(L) 136  Potassium 3.5 - 5.1 mmol/L 4.9 4.9 4.3  Chloride 101 - 111 mmol/L 92(L) 93(L) 91(L)  CO2 22 - 32 mmol/L 26 27 32  Calcium 8.9 - 10.3 mg/dL 9.0 8.7(L) 9.1  Total Protein 6.5 - 8.1 g/dL - - 8.0  Total Bilirubin 0.3 - 1.2 mg/dL - - 0.5  Alkaline Phos 38 - 126 U/L - - 120  AST 15 - 41 U/L - - 15  ALT 17 - 63 U/L - - 10(L)   Lactic acid 1.17 ESR 79 CRP 8.0 BCx 5/7 -> negative Intraoperative Cx R 1st and 2nd toes & R 5th toe 5/8 -> Staph aureus, skin flora (enterococcus, diphtheroid, coag neg staph, and possible anaerobe)  Right foot x-ray 01/31/2018 Displaced fractures of the distal fifth metatarsal and base of the proximal phalanx of the fifth digit likely secondary to underlying osteomyelitis. There is associated dislocation at the fifth MTP joint. MRI may provide better evaluation if clinically indicated.  MRI right foot 02/02/2018 1. Soft tissue ulcer overlying the fifth MTP joint. Osteomyelitis of the fifth metatarsal head and fifth proximal phalanx with severe marrow edema. 2. Mild reactive marrow edema without bone destruction in the fifth middle phalanx. Mild marrow edema in the fourth metatarsal head without cortical destruction likely reflecting reactive edema secondary to adjacent inflammation versus less likely osteomyelitis.  Right foot x-ray 02/02/2018 Postoperative changes compatible with fifth toe amputation and partial amputation of the fifth metatarsal bone. Lucency involving the proximal fourth metatarsal bone. This is new from the previous examination and compatible with postoperative changes.  Discharge Instructions: Discharge Instructions    Call MD for:  redness, tenderness, or signs of infection (pain, swelling, redness, odor or green/yellow discharge around incision site)   Complete by:  As directed    Call MD for:  severe uncontrolled pain   Complete by:  As directed    Diet - low sodium heart healthy   Complete by:  As directed     Discharge instructions   Complete by:  As directed    Mr. Hoefling,  It was nice to meet you.  While you were in the hospital, we treated you for an infection in your foot that required an amputation. There was some bacteria still growing in the bone, so you will need to be on 2 different kinds of antibiotics.  You will get one of them (IV vancomycin) with your dialysis sessions for 2 weeks. The other one is called flagyl. Please take flagyl 564m every 8 hours for 2 weeks (stop date will be 5/24). I sent a prescription for 7 days of pain medicine to your pharmacy - you may take oxycodone 511mevery 6 hours as needed.  Please continue to go to your dialysis sessions as you have been doing and following up with the foot doctor, Dr. PrMarch Rummageyour next appointment is on May 16).  Please follow up with your primary doctor so they can help manage your diabetes as an outpatient, as controlling your blood sugar can also help your foot heal. Thank you!   Discharge patient   Complete by:  As directed    After dialysis   Discharge disposition:  01-Home or Self Care   Discharge patient date:  02/05/2018   Increase activity slowly   Complete by:  As directed      Signed: HuColbert EwingMD 02/07/2018, 9:51 AM   Pager: P Mamie [phone]

## 2018-02-02 NOTE — Consult Note (Addendum)
WOC consult was requested prior to podiatry involvement.  Dr March Rummage has performed a consult and recommends surgical interventios for foot wound.  Pt has osteomyelitis, this complex medical condition is beyond the scope of practice for [place] nursing.  Please refer to podiatry team for further plan of care. Please re-consult if further assistance is needed.  Thank-you,  Julien Girt MSN, [place], [place], [place], [place]

## 2018-02-02 NOTE — Anesthesia Preprocedure Evaluation (Addendum)
Anesthesia Evaluation  Patient identified by MRN, date of birth, ID band Patient awake    Reviewed: Allergy & Precautions, NPO status , Patient's Chart, lab work & pertinent test results  History of Anesthesia Complications Negative for: history of anesthetic complications  Airway Mallampati: II  TM Distance: >3 FB Neck ROM: Full    Dental  (+) Teeth Intact, Dental Advisory Given   Pulmonary neg pulmonary ROS,    breath sounds clear to auscultation       Cardiovascular hypertension, Pt. on medications (-) angina Rhythm:Regular Rate:Normal     Neuro/Psych negative neurological ROS     GI/Hepatic negative GI ROS, Neg liver ROS,   Endo/Other  diabetes, Insulin Dependent  Renal/GU Dialysis and ESRFRenal disease (K+ 4.8)     Musculoskeletal   Abdominal Normal abdominal exam  (+)   Peds  Hematology  (+) Blood dyscrasia (Hb 9.3), anemia ,   Anesthesia Other Findings   Reproductive/Obstetrics                            Anesthesia Physical  Anesthesia Plan  ASA: III  Anesthesia Plan: MAC   Post-op Pain Management:    Induction:   PONV Risk Score and Plan: 1 and Ondansetron  Airway Management Planned: Natural Airway and Nasal Cannula  Additional Equipment:   Intra-op Plan:   Post-operative Plan:   Informed Consent: I have reviewed the patients History and Physical, chart, labs and discussed the procedure including the risks, benefits and alternatives for the proposed anesthesia with the patient or authorized representative who has indicated his/her understanding and acceptance.   Dental advisory given  Plan Discussed with: CRNA and Surgeon  Anesthesia Plan Comments: (Possible GA, patient consented. )      Anesthesia Quick Evaluation

## 2018-02-02 NOTE — Anesthesia Procedure Notes (Signed)
Procedure Name: MAC Date/Time: 02/02/2018 2:40 PM Performed by: Scheryl Darter, CRNA Pre-anesthesia Checklist: Patient identified, Emergency Drugs available, Patient being monitored, Suction available and Timeout performed Patient Re-evaluated:Patient Re-evaluated prior to induction Oxygen Delivery Method: Nasal cannula Placement Confirmation: positive ETCO2

## 2018-02-02 NOTE — Op Note (Signed)
Patient Name: [name] DOB: 1981-07-30  MRN: [id number]  Date of Service: 02/02/18   Surgeon: Dr. Hardie Pulley, DPM Assistants: None Pre-operative Diagnosis: Osteomyelitis R 5th metatarsal and toe; Wound R foot Post-operative Diagnosis: same Procedures:             1) Partial 5th Ray Amputation  2) Filleted Toe Flap  3) Debridement and Irrigation of R foot Wound Pathology/Specimens:             1) R 5th Met and Toe - Path  2) R Foot Bone - Path  3) R Foot Bone - Micro  4) R Foot ST - Micro Anesthesia: MAC Hemostasis: Anatomic Estimated Blood Loss: 41m Materials: None Medications: Vancomycin powder topical Complications: None  Indications for Procedure: This is a 37year old male with wounds to the R foot. He has osteomyelitis of the R 5th toe and metatarsal and thus it was discussed that he would benefit from removal of the infected tissue. All risks, benefits and alternatives discussed. No guarantees given.  Procedure in Detail: Patient was identified in pre-operative holding area. Formal consent was signed and the right lower extremity was marked. Patient was brought back to the operating room and placed on the operating room table in the supine position. Anesthesia was induced. The **lower extremity was prepped and draped in the usual sterile fashion. Timeout was taken to confirm patient name, laterality, and procedure prior to incision. Attention was then directed to the right foot where there was a necrotic wound with exposed bone at the 5th metatarsal. A n incision was made down the lateral aspect of the fifth metatarsal with care to excise the area of previous ulceration. Dissection was carried down to bone. The tissue was elevated from the 5th metatarsal. While the end of the metatarsal was necrotic and soft, the proximal aspect of the metatarsal was firm and healthy in appearance. A sagittal saw was used to excise the 5th metatarsal with preservation of the 5th  metatarsal base. Dissection was carried about the 5th toe where the toe bones were dissected away from the remaining tissue. The remaining tissue appeared healthy and thus the plan was to use this filleted toe flap for assistance in wound closure. The remaining wound was sharply debrided with rongeur and misonix debrider. The area was then closed with 3-0 nylon and skin staples. The toe was filleted and used to close the 5th metatarsal ulceration.  Attention was then directed to the plantar aspect of the foot where the 1st interspace plantar wound was sharply excisionally debrided with a 15 blade. The tissue was further excisionally debrided with Misonix debrider. Post-debridement the wound measured approximately 5x3.  WGastrointestinal Diagnostic Centerwas applied with black sponge and adherent dressing over the lateral foot incision and plantar foot wound. Good seal noted. The foot was then dressed with 4x4 and ACE bandage. Patient tolerated the procedure well.  Disposition: Following a period of post-operative monitoring, patient will be transferred back to the floor.   NWB RLE PT/OT eval Continue ABx Await culture for Abx reccs

## 2018-02-02 NOTE — Progress Notes (Signed)
Pt refused to have PCR done tonight, CHG offered, pt refused at this time. Pt just want to get some rest. Pt wants his dialysis done first thing in the morning before surgery, verified with dialysis nurse, no orders for dialysis are in yet. Will follow up in the morning.

## 2018-02-03 ENCOUNTER — Other Ambulatory Visit: Payer: Self-pay

## 2018-02-03 ENCOUNTER — Encounter (HOSPITAL_COMMUNITY): Payer: Self-pay | Admitting: Podiatry

## 2018-02-03 DIAGNOSIS — R159 Full incontinence of feces: Secondary | ICD-10-CM

## 2018-02-03 LAB — GLUCOSE, CAPILLARY
Glucose-Capillary: 211 mg/dL — ABNORMAL HIGH (ref 65–99)
Glucose-Capillary: 240 mg/dL — ABNORMAL HIGH (ref 65–99)
Glucose-Capillary: 268 mg/dL — ABNORMAL HIGH (ref 65–99)
Glucose-Capillary: 290 mg/dL — ABNORMAL HIGH (ref 65–99)

## 2018-02-03 MED ORDER — PIPERACILLIN-TAZOBACTAM 3.375 G IVPB
3.3750 g | Freq: Two times a day (BID) | INTRAVENOUS | Status: DC
  Administered 2018-02-03 – 2018-02-04 (×3): 3.375 g via INTRAVENOUS
  Filled 2018-02-03 (×3): qty 50

## 2018-02-03 MED ORDER — INSULIN ASPART 100 UNIT/ML ~~LOC~~ SOLN
0.0000 [IU] | Freq: Three times a day (TID) | SUBCUTANEOUS | Status: DC
  Administered 2018-02-03 (×2): 5 [IU] via SUBCUTANEOUS
  Administered 2018-02-03: 8 [IU] via SUBCUTANEOUS
  Administered 2018-02-04: 5 [IU] via SUBCUTANEOUS
  Administered 2018-02-04: 11 [IU] via SUBCUTANEOUS
  Administered 2018-02-04: 5 [IU] via SUBCUTANEOUS
  Administered 2018-02-05: 8 [IU] via SUBCUTANEOUS

## 2018-02-03 MED ORDER — VANCOMYCIN HCL IN DEXTROSE 750-5 MG/150ML-% IV SOLN
750.0000 mg | Freq: Once | INTRAVENOUS | Status: AC
  Administered 2018-02-03: 750 mg via INTRAVENOUS
  Filled 2018-02-03: qty 150

## 2018-02-03 MED ORDER — OXYCODONE HCL 5 MG PO TABS
5.0000 mg | ORAL_TABLET | ORAL | Status: DC | PRN
  Administered 2018-02-03 – 2018-02-05 (×5): 5 mg via ORAL
  Filled 2018-02-03 (×5): qty 1

## 2018-02-03 MED ORDER — VANCOMYCIN HCL IN DEXTROSE 750-5 MG/150ML-% IV SOLN
750.0000 mg | INTRAVENOUS | Status: DC
  Administered 2018-02-05: 750 mg via INTRAVENOUS
  Filled 2018-02-03 (×4): qty 150

## 2018-02-03 MED ORDER — INSULIN ASPART 100 UNIT/ML ~~LOC~~ SOLN
0.0000 [IU] | Freq: Every day | SUBCUTANEOUS | Status: DC
  Administered 2018-02-03: 3 [IU] via SUBCUTANEOUS
  Administered 2018-02-04 – 2018-02-05 (×2): 2 [IU] via SUBCUTANEOUS

## 2018-02-03 MED ORDER — HYDROMORPHONE HCL 2 MG/ML IJ SOLN
1.0000 mg | Freq: Three times a day (TID) | INTRAMUSCULAR | Status: DC | PRN
  Administered 2018-02-04 (×2): 1 mg via INTRAVENOUS
  Filled 2018-02-03 (×2): qty 1

## 2018-02-03 NOTE — Consult Note (Addendum)
WOC assistance requested for assistance with troubleshooting Vac which is alarming.  Prevena Vac in place to right foot but suction is not functioning. One hour spent troubleshooting; changed track pad which had mod amt clotted blood underneath, changed cannister, which had mod amt blood clotted in the tubing, and cut away drape and applied barrier ring to attempt to improve seal around toes.  None of these interventions was effective for Prevena suction to improve.  Called Dr March Rummage of the podiatry service to notify him and Vac was ordered to be discontinued, moist gauze dressing applied. Full thickness 2X3X.3cm wound beefy red with small amt bleeding to anterior plantar foot, staples intact and well approximated to outer foot.   Applied moist gauze to full thickness wound and dry gauze over staples, then kerlex.  Pt refused to have ace wrap applied.  Please refer to podiatry for further questions and plan of care. Please re-consult if further assistance is needed.  Thank-you,  Julien Girt MSN, [place], [place], [place], [place]

## 2018-02-03 NOTE — Evaluation (Signed)
Occupational Therapy Evaluation Patient Details Name: [name] MRN: [id number] DOB: 09/16/1981 Today's Date: 02/03/2018    History of Present Illness Pt is a 37 y.o. male s/p R 5th toe and metatarsal amputation. PMHx: End stage renal disease on HD, DM 1, diabetic foot ulcers and gastroparesis, HTN.   Clinical Impression   Pt reports he was independent with ADL PTA. Currently pt overall supervision-min guard with ADL and functional mobility. Pt becoming agitated when discussing RLE NWB and infection prevention via not getting dressing wet during bathing; pt reporting "this is BS, I can't do this (in reference to being NWB and using RW/crutches), this doesn't fit my lifestyle"; "I wish I would have known not to get my foot wet before surgery, then it may not have gotten infected, this could be a lawsuit". Educated pt on POST-op precautions vs PRE-op precautions but pt does not seem to understand this difference. Pt planning to d/c home with intermittent supervision from family. Pt would benefit from continued skilled OT to address established goals.    Follow Up Recommendations  No OT follow up;Supervision - Intermittent    Equipment Recommendations  3 in 1 bedside commode    Recommendations for Other Services       Precautions / Restrictions Precautions Precautions: Fall Restrictions Weight Bearing Restrictions: Yes RLE Weight Bearing: Non weight bearing      Mobility Bed Mobility Overal bed mobility: Modified Independent                Transfers Overall transfer level: Needs assistance Equipment used: Rolling walker (2 wheeled) Transfers: Sit to/from Stand Sit to Stand: Min guard         General transfer comment: for safety initially and cues for RLE NWB    Balance Overall balance assessment: Mild deficits observed, not formally tested                                         ADL either performed or assessed with clinical judgement   ADL  Overall ADL's : Needs assistance/impaired Eating/Feeding: Independent;Sitting   Grooming: Supervision/safety;Standing   Upper Body Bathing: Set up;Sitting   Lower Body Bathing: Supervison/ safety;Sit to/from stand   Upper Body Dressing : Set up;Sitting   Lower Body Dressing: Supervision/safety;Sit to/from stand   Toilet Transfer: Min guard;Ambulation;RW Toilet Transfer Details (indicate cue type and reason): Simulated by sit to stand from EOB with functional mobility         Functional mobility during ADLs: Min guard;Rolling walker General ADL Comments: Educated pt on RLE NWB status, keeping R foot dressing dry during bathing, and compensatory strategies. Pt getting agitated when discussing NWB and not washing foot post op until cleared by MD; pt reporting that maybe if he didnt get the foot wet pre-op he wouldnt have gotten an infection. Reassured pt that these are post op precatuions and ultimately set by MD.     Vision         Perception     Praxis      Pertinent Vitals/Pain Pain Assessment: Faces Faces Pain Scale: Hurts little more Pain Location: R foot Pain Descriptors / Indicators: Discomfort;Sore Pain Intervention(s): Monitored during session;Repositioned     Hand Dominance     Extremity/Trunk Assessment Upper Extremity Assessment Upper Extremity Assessment: Overall WFL for tasks assessed   Lower Extremity Assessment Lower Extremity Assessment: Defer to PT evaluation   Cervical /  Trunk Assessment Cervical / Trunk Assessment: Normal   Communication Communication Communication: No difficulties   Cognition Arousal/Alertness: Awake/alert Behavior During Therapy: Agitated Overall Cognitive Status: Within Functional Limits for tasks assessed                                     General Comments       Exercises     Shoulder Instructions      Home Living Family/patient expects to be discharged to:: Private residence Living  Arrangements: Parent Available Help at Discharge: Family;Available PRN/intermittently Type of Home: Apartment Home Access: Stairs to enter Entrance Stairs-Number of Steps: flight    Home Layout: One level     Bathroom Shower/Tub: Corporate investment banker: Standard     Home Equipment: None          Prior Functioning/Environment Level of Independence: Independent                 OT Problem List: Impaired balance (sitting and/or standing);Pain      OT Treatment/Interventions: Self-care/ADL training;DME and/or AE instruction;Therapeutic activities;Patient/family education;Balance training    OT Goals(Current goals can be found in the care plan section) Acute Rehab OT Goals Patient Stated Goal: talk to the doctor OT Goal Formulation: With patient Time For Goal Achievement: 02/17/18 Potential to Achieve Goals: Good ADL Goals Pt Will Perform Tub/Shower Transfer: Tub transfer;with modified independence;ambulating;3 in 1;rolling walker  OT Frequency: Min 2X/week   Barriers to D/C: Inaccessible home environment          Co-evaluation PT/OT/SLP Co-Evaluation/Treatment: Yes Reason for Co-Treatment: Necessary to address cognition/behavior during functional activity   OT goals addressed during session: ADL's and self-care      AM-PAC PT "6 Clicks" Daily Activity     Outcome Measure Help from another person eating meals?: None Help from another person taking care of personal grooming?: A Little Help from another person toileting, which includes using toliet, bedpan, or urinal?: A Little Help from another person bathing (including washing, rinsing, drying)?: A Little Help from another person to put on and taking off regular upper body clothing?: None Help from another person to put on and taking off regular lower body clothing?: A Little 6 Click Score: 20   End of Session Equipment Utilized During Treatment: Rolling walker Nurse Communication:  Mobility status  Activity Tolerance: Patient tolerated treatment well Patient left: in bed;with call bell/phone within reach  OT Visit Diagnosis: Unsteadiness on feet (R26.81);Other abnormalities of gait and mobility (R26.89);Pain Pain - Right/Left: Right Pain - part of body: Ankle and joints of foot                Time: 1457-1515 OT Time Calculation (min): 18 min Charges:  OT General Charges $OT Visit: 1 Visit OT Evaluation $OT Eval Low Complexity: 1 Low G-Codes:     Jaeveon Ashland A. Ulice Brilliant, M.S., OTR/L Pager: [place] 02/03/2018, 3:26 PM

## 2018-02-03 NOTE — Progress Notes (Addendum)
Subjective:  Mr. Dault was seen lying in bed comfortably this morning. He reports the pain in his RLE is about a 4-5/10. He states his current pain regimen is helpful. He is tolerating PO intake. He also tells me he has had years of nocturnal BM. He will often wake up and find his sheets soiled. This has required him to wear a diaper. He denies abdominal pain. Denies blood in his stools. He did see GI in the past and provided a stool sample for them, however he is not sure of the results and was not able to make it back to them for follow-up due to lack of transportation. Has never had a colonoscopy before.   Objective:  Vital signs in last 24 hours: Vitals:   02/02/18 1815 02/02/18 2017 02/03/18 0143 02/03/18 0620  BP: (!) 151/97 130/82 (!) 116/97 (!) 157/93  Pulse: 88 90 97 84  Resp:  19 18 18   Temp: 97.9 F (36.6 C) 98.4 F (36.9 C) 98.9 F (37.2 C) 98.9 F (37.2 C)  TempSrc: Oral Oral Oral Oral  SpO2: 100% 100% 100% 100%  Weight:       GEN: Lying in bed in NAD CV: NR & RR, no m/r/g PULM: CTAB, no wheezes or rales in anterior lung fields ABD: Soft, NT, ND, +BS MSK: Right foot bandaged. 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 Rackley is a 37yo male with PMH of type 1 diabetes, ESRD on HD MWF, HTN, and diabetic foot ulcer/osteomyelitis of right lower extremity who presents for management of osteomyelitis of right 5th metatarsal head, now s/p right 5th metatarsal amputation 5/8. Patient is afebrile and HDS - will f/u with Podiatry regarding plans for post-op care.  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 Still coping with the amputation. Will f/u with Podiatry regarding post-op plans. He requests to see Dr. March Rummage this AM. Cover with empiric IV abs for at least 2-5 days post-op per IDSA guidelines and narrow according to culture results. Will need to wean off IV narcotics prior to discharge. - Podiatry consulted; appreciate their assistance - Monitor fever curve - Wound consult - Wound VAC until discharge, no need for home [facility] - Increase to Oxy IR 5mg  q4h PRN for moderate-severe pain - Decrease to IV dilaudid 1mg  q8h PRN for breakthrough pain - F/u BCx 5/7 -> NG x24h - F/u intraoperative Cx 5/8 -> WBC, NGTD - PT/OT eval - Patient requests regular diet - Will cover with empiric IV vanc/zosyn for at least 2-5 days post-op per IDSA guidelines and narrow according to culture results  Nocturnal incontinence Patient reports years of nocturnal incontinence. Does not have diarrhea, bloody stools, or abdominal pain. Saw GI in the past, but was unable to follow up with them. Has never had a colonoscopy. - Outpatient f/u  Type 1 DM Patient's home medications listed as Lantus 20u QHS and Novolog SSI. Patient reported he was only taking 6 units of lantus at home. Most recent BG have been elevated. Most recent A1c in 09/2017 was 6.2 - Increased Lantus  to 10u QHS - SSI-M TID WC - CBG monitoring  ESRD on HD MWF Adherent to dialysis - Nephrology consulted for inpatient HD; appreciate their assistance - Continue renvela and sensipar  Hx of HTN BP 157/93. Home regimen includes amlodipine 10mg  daily and losartan 50mg  daily - Holding home losartan, can restart if BP remains elevated - Continue home amlodipine 10mg  daily  Anemia Likely anemia of chronic renal disease with Hb near baseline. Hb 9.6 this AM - F/u CBC  Dispo: Anticipated discharge in approximately 2-3 day(s).  Colbert Ewing, MD 02/03/2018, 7:03 AM Pager: [place] [phone]

## 2018-02-03 NOTE — Care Management Note (Signed)
Case Management Note  Patient Details  Name: [name] MRN: [id number] Date of Birth: 16-Nov-1980  Subjective/Objective:                    Action/Plan:  PT to see patient await eval.  Patient from home lives with his mother . Patient had [facility] prior to admission. Placed a call to [name] with [facility], awaiting call back. Patient stated [name] was going drop him as a patient. Patient did not provide reason. He stated he is fine "with any home health agency" .  Patient does not have any DME at home.   Patient uses Dr [name] as PCP. Explained PCP and how to get one , patient again stated he uses DR [name] as PCP.    Expected Discharge Date:                  Expected Discharge Plan:     In-House Referral:     Discharge planning Services  CM Consult  Post Acute Care Choice:  Durable Medical Equipment, Home Health Choice offered to:  Patient  DME Arranged:    DME Agency:     HH Arranged:    [place] Agency:     Status of Service:  In process, will continue to follow  If discussed at Long Length of Stay Meetings, dates discussed:    Additional Comments:  Shane Favre, RN 02/03/2018, 11:34 AM

## 2018-02-03 NOTE — Progress Notes (Signed)
Pharmacy Antibiotic Note  [name] is a 37 y.o. male admitted on 01/31/2018 with osteomyelitis of toe/foot. Patient had R-5th toe amputation, metatarsal resection, and plantar space I&D on 5/8. Pharmacy has been consulted for Vancomycin and Zosyn dosing.   Patient has ESRD on HD MWF. Last HD was on 5/8 and patient tolerated full session. Last dose of IV Vancomycin was on 5/7 (1500mg ). Cultures are pending. WBC is within normal limits. Patient is afebrile.   Plan: Give Vancomycin 750mg  IV x1 now since now post-HD dose given yesterday, then restart Vancomycin 750mg  post HD MWF. Add Zosyn 3.375g IV every 12 hours - 4 hr infusion. Monitor HD schedule and tolerance to adjust dosing as needed. F/up culture for narrowing antibiotics. Likely will need 6 weeks of therapy.  Weight: 162 lb 0.6 oz (73.5 kg)  Temp (24hrs), Avg:98.2 F (36.8 C), Min:97.8 F (36.6 C), Max:98.9 F (37.2 C)  Recent Labs  Lab 01/31/18 2248 01/31/18 2314 02/02/18 0731  WBC 12.9*  --  10.0  CREATININE 6.26*  --  8.99*  LATICACIDVEN  --  1.17  --     Estimated Creatinine Clearance: 10.1 mL/min (A) (by C-G formula based on SCr of 8.99 mg/dL (H)).    Allergies  Allergen Reactions  . No Known Allergies     Antimicrobials this admission: Cefepime 5/7 x1 Vancomycin 5/7 -1500mg  x1 (500mg  on 5/8 was topical); 5/8 >> Zosyn 5/8 >>  Dose adjustments this admission:   Microbiology results: 5/8 Soft tissue, toe>> 5/8 Soft tissue, foot >> 5/8 MRSA pcr negative 5/7 BCx x2 >> ngtd  Thank you for allowing pharmacy to be a part of this patient's care.  Sloan Leiter, PharmD, BCPS, BCCCP Clinical Pharmacist Clinical phone 02/03/2018 until 3:30PM [phone] After hours, please call #[phone] 02/03/2018 10:02 AM

## 2018-02-03 NOTE — Progress Notes (Signed)
  [facility] Progress Note    Subjective:   No new complaints   Objective:   BP (!) 147/84 (BP Location: Left Arm)   Pulse 91   Temp 99 F (37.2 C) (Oral)   Resp 18   Wt 73.5 kg (162 lb 0.6 oz)   SpO2 100%   BMI 26.15 kg/m   Intake/Output: I/O last 3 completed shifts: In: 61 [P.O.:390; I.V.:300] Out: 4560 [Drains:10; Other:4500; Blood:50]   Intake/Output this shift:  Total I/O In: 3 [I.V.:3] Out: -  Weight change:   Physical Exam: Gen:NAD CVS: no rub Resp: cta Abd: benign Ext: no edema, s/p 5th ray amputation on right with some serosanguinous drainage on gauze  Labs: BMET Recent Labs  Lab 01/31/18 2248 02/02/18 0731  NA 136 132*  K 4.3 4.9  CL 91* 93*  CO2 32 27  GLUCOSE 68 146*  BUN 23* 43*  CREATININE 6.26* 8.99*  ALBUMIN 3.0* 2.4*  CALCIUM 9.1 8.7*  PHOS  --  4.7*   CBC Recent Labs  Lab 01/31/18 2248 02/02/18 0731  WBC 12.9* 10.0  NEUTROABS 9.8*  --   HGB 9.9* 9.6*  HCT 31.4* 31.0*  MCV 91.3 89.6  PLT 295 261    @IMGRELPRIORS @ Medications:    . amLODipine  10 mg Oral Daily  . calcitRIOL  1.75 mcg Oral Daily  . cinacalcet  30 mg Oral Q supper  . darbepoetin (ARANESP) injection - DIALYSIS  40 mcg Intravenous Q Wed-HD  . heparin  9,000 Units Dialysis Once in dialysis  . insulin aspart  0-15 Units Subcutaneous TID WC  . insulin aspart  0-5 Units Subcutaneous QHS  . insulin glargine  10 Units Subcutaneous QHS  . sevelamer carbonate  4,000 mg Oral BID WC  . sodium chloride flush  3 mL Intravenous Q12H   Dialysis Orders:[facility] MWF 4 hrs 180 NRe 400/800 73.5 kg 2.0 K/ 2.0 Ca  -Heparin 9000 units IV TIW -Mircera 75 mcg IV q 2 weeks (New dosing-hasn't rec'd yet.)  -Calcitriol 1.75 mcg PO TIW   Assessment/ Plan:   1. Osteomyelitis R Foot. S/p R 5th toe and metatarsal amputation 02/02/18. Has rec'd Vanc/Cefepime. Per primary/Dr. March Rummage. For wound vac but unable to get adequate seal.  Discharge today per Podiatry. 2.  ESRD -   MWF HD on schedule. Can f/u with his outpatient unit if discharged today  3.  Hypertension/volume  -Hypertensive at present. UFG 5.5 tolerating well. Losartan 50 mg on OP med list however on amlodipine 10 mg PO here. (amlodipine not on OP med list).  4.  Anemia  - HGB 9.6 give Aranesp 40 mcg IV with HD today.  5.  Metabolic bone disease -  Ca 8.7 C Ca 10. Phos 4.7. Continue binders, senispar, VDRA.  6.  Nutrition - renal diet 7.  DM: per primary 8. Disposition- for discharge to home per Podiatry.  Awaiting PT/OT eval prior to discharge.    Donetta Potts, MD [place] Pager [phone] 02/03/2018, 12:15 PM

## 2018-02-03 NOTE — Plan of Care (Signed)
Will round on patient after office hours today.  -Appreciate PT eval. He is to be NWB to the RLE. Will probably require walker. -Soft Tissue and Bone cultures pending from 5/8 - if bone culture positive will require IV abx for 6 weeks targeted against organisms. Likely dosed with dialysis. -Once abx reccs finalized he will be ok for discharge. -Will d/c [facility] prior to d/c. No need for home [facility].  Please call with questions or concerns.  Evelina Bucy, DPM [phone]

## 2018-02-03 NOTE — Progress Notes (Signed)
Internal Medicine Attending  Date: 02/03/2018  Patient name: [name] Medical record number: [id number] Date of birth: December 17, 1980 Age: 37 y.o. Gender: male  I saw and evaluated the patient. I reviewed the resident's note by Dr. Ronalee Red and I agree with the resident's findings and plans as documented in her progress note.  When seen on rounds this morning Mr. Mazzoni was initially quite frustrated with his situation. He felt he was uninformed of what would be occurring after the amputation. We tried to answer his questions and encouraged him to speak with Dr. March Rummage his podiatrist who did did the amputation, to come up with a plan that was acceptable to him. We have switched his pain medication to an oral regimen and are continuing the antibiotics for 2 days awaiting the results of the cultures to guide further therapy.

## 2018-02-03 NOTE — Progress Notes (Signed)
Subjective:  Patient ID: [name], male    DOB: March 13, 1981,  MRN: [id number]  Patient seen at bedside. Mother at bedside but left during encounter. Pain rather controlled. Per RN pt has had behavioral issues during his stay, however he was polite during our encounter. Denies N/V/F/Ch.  Objective:   Vitals:   02/03/18 1031 02/03/18 1308  BP: (!) 147/84 130/78  Pulse: 91 91  Resp: 18 20  Temp: 99 F (37.2 C) 98.8 F (37.1 C)  SpO2: 100% 100%   General AA&O x3. Normal mood and affect.  Vascular R foot warm to touch.  Neurologic Epicritic sensation grossly absent.  Dermatologic  Incision healing well, skin coapted. 1st/2nd plantar wound granulating. No erythema. No excessive warmth. No signs of acute infection.  Orthopedic: Motor intact distally.   Results for orders placed or performed during the hospital encounter of 01/31/18 (from the past 24 hour(s))  Glucose, capillary     Status: Abnormal   Collection Time: 02/02/18 10:04 PM  Result Value Ref Range   Glucose-Capillary 430 (H) 65 - 99 mg/dL   Comment 1 Notify RN    Comment 2 Document in Chart   Glucose, capillary     Status: Abnormal   Collection Time: 02/03/18  7:39 AM  Result Value Ref Range   Glucose-Capillary 211 (H) 65 - 99 mg/dL  Glucose, capillary     Status: Abnormal   Collection Time: 02/03/18 11:48 AM  Result Value Ref Range   Glucose-Capillary 240 (H) 65 - 99 mg/dL  Glucose, capillary     Status: Abnormal   Collection Time: 02/03/18  4:11 PM  Result Value Ref Range   Glucose-Capillary 268 (H) 65 - 99 mg/dL   Results for orders placed or performed during the hospital encounter of 01/31/18  Culture, blood (routine x 2)     Status: None (Preliminary result)   Collection Time: 02/01/18 10:00 AM  Result Value Ref Range Status   Specimen Description BLOOD BLOOD LEFT HAND  Final   Special Requests   Final    BOTTLES DRAWN AEROBIC AND ANAEROBIC Blood Culture adequate volume   Culture   Final    NO GROWTH  2 DAYS Performed at [facility], [address], [place], [place] [address]    Report Status PENDING  Incomplete  Culture, blood (routine x 2)     Status: None (Preliminary result)   Collection Time: 02/01/18 10:00 AM  Result Value Ref Range Status   Specimen Description BLOOD BLOOD LEFT HAND  Final   Special Requests   Final    BOTTLES DRAWN AEROBIC AND ANAEROBIC Blood Culture adequate volume   Culture   Final    NO GROWTH 2 DAYS Performed at [facility], [place] [address], [place], [place] [address]    Report Status PENDING  Incomplete  MRSA PCR Screening     Status: None   Collection Time: 02/02/18  8:35 AM  Result Value Ref Range Status   MRSA by PCR NEGATIVE NEGATIVE Final    Comment:        The GeneXpert MRSA Assay (FDA approved for NASAL specimens only), is one component of a comprehensive MRSA colonization surveillance program. It is not intended to diagnose MRSA infection nor to guide or monitor treatment for MRSA infections. Performed at [facility], [place] [address], [place], [place] [address]   Aerobic/Anaerobic Culture (surgical/deep wound)     Status: None (Preliminary result)   Collection Time: 02/02/18  2:38 PM  Result Value Ref Range Status   Specimen Description TISSUE RIGHT FOOT  Final   Special Requests SPEC A  Final   Gram Stain   Final    MODERATE WBC PRESENT,BOTH PMN AND MONONUCLEAR NO ORGANISMS SEEN    Culture   Final    CULTURE REINCUBATED FOR BETTER GROWTH Performed at [facility], [address], [place], [place] [address]    Report Status PENDING  Incomplete  Aerobic/Anaerobic Culture (surgical/deep wound)     Status: None (Preliminary result)   Collection Time: 02/02/18  2:42 PM  Result Value Ref Range Status   Specimen Description TISSUE  Final   Special Requests RIGHT FOOT FIFTH TOE SPEC B  Final   Gram Stain   Final    MODERATE WBC PRESENT, PREDOMINANTLY MONONUCLEAR NO ORGANISMS SEEN    Culture   Final     CULTURE REINCUBATED FOR BETTER GROWTH Performed at [facility], [place] [address], [place],  [address]    Report Status PENDING  Incomplete    Assessment & Plan:  Patient was evaluated and treated and all questions answered.  S/p R Partial 5th Ray Amputation with Filleted Toe Flap -[facility] d/ced earlier today due to seal issues. -Dressing changed. Wound healing well, incision coapted. Betadine painted to incision and DSD applied. Dressing to be left intact until follow-up. Will arrange office f/u next Thursday. -Continue empiric Abx until tailoring of abx based upon cultures. -Micro pending. WBC but no organism on GS. -Path pending. -Discussed NWB to the RLE for best chance at healing of the amputation site. Patient verbalized understanding and states he will try to be compliant with NWB restriction. Will be working with PT on crutches tomorrow. Walked today in front of provider without his surgical shoe without assistance. New shoe ordered for patient. -Discussed with patient that I did attempt to speak to him post-procedure, however he was still groggy from anesthesia. Additionally I asked at the visitor's desk for any family members, however was told he did not have any family in the waiting area (which he confirmed - stated his mother was in his hospital room not the waiting area). -Discussed importance of diet for healing of surgical site. Patient states he now has better knowledge of his sugars and will try to watch his diet better. -Discussed with patient his reported behavior while in house. Patient acknowledged his reported behavior and stated he would attempt to improve his behavior.  Will continue to follow and update reccs.  Evelina Bucy, DPM [phone] with questions or concerns.

## 2018-02-03 NOTE — Progress Notes (Signed)
02/03/18 1631  PT Visit Information  Last PT Received On 02/03/18  Assistance Needed +1  PT/OT/SLP Co-Evaluation/Treatment Yes  Reason for Co-Treatment Necessary to address cognition/behavior during functional activity  PT goals addressed during session Mobility/safety with mobility;Balance;Proper use of DME  History of Present Illness Pt is a 37 y.o. male s/p R 5th toe and metatarsal amputation. PMHx: End stage renal disease on HD, DM 1, diabetic foot ulcers and gastroparesis, HTN.  Precautions  Precautions Fall  Restrictions  Weight Bearing Restrictions Yes  RLE Weight Bearing NWB  Home Living  Family/patient expects to be discharged to: Private residence  Living Arrangements Parent  Available Help at Discharge Family;Available PRN/intermittently  Type of Home Apartment  Home Access Stairs to enter  Entrance Stairs-Number of Steps flight   Home Layout One level  Bathroom Shower/Tub Tub/shower unit;Curtain  Automotive engineer None  Prior Function  Level of Independence Independent  Communication  Communication No difficulties  Pain Assessment  Pain Assessment Faces  Faces Pain Scale 4  Pain Location R foot  Pain Descriptors / Indicators Discomfort;Sore  Pain Intervention(s) Limited activity within patient's tolerance;Monitored during session;Repositioned  Cognition  Arousal/Alertness Awake/alert  Behavior During Therapy Agitated  Overall Cognitive Status Within Functional Limits for tasks assessed  General Comments Pt agitated when educated about NWB precautions asking "where is my doctor"  Upper Extremity Assessment  Upper Extremity Assessment Defer to OT evaluation  Lower Extremity Assessment  Lower Extremity Assessment RLE deficits/detail  RLE Deficits / Details R foot bandaged. Noted drainage on bandages on R foot.   Cervical / Trunk Assessment  Cervical / Trunk Assessment Normal  Bed Mobility  Overal bed mobility Modified Independent   Transfers  Overall transfer level Needs assistance  Equipment used Rolling walker (2 wheeled)  Transfers Sit to/from Stand  Sit to Stand Min guard  General transfer comment for safety initially and cues for RLE NWB as pt placing heel on ground.   Ambulation/Gait  Ambulation/Gait assistance Min guard;Supervision  Ambulation Distance (Feet) 25 Feet  Assistive device Rolling walker (2 wheeled)  Gait Pattern/deviations Step-to pattern  General Gait Details Ambulation performed within the room. Verbal cues for safety using RW. Pt becoming agitated when having to ambulate NWB on RLE. Stated "this does not fit my lifestyle" and "I need to talk to my doctor ASAP." Educated that we would attempt with crutches during next session.  Gait velocity Decreased   Gait velocity interpretation <1.8 ft/sec, indicate of risk for recurrent falls  Balance  Overall balance assessment Mild deficits observed, not formally tested  PT - End of Session  Equipment Utilized During Treatment Gait belt  Activity Tolerance Patient tolerated treatment well  Patient left in bed;with call bell/phone within reach  Nurse Communication Mobility status  PT Assessment  PT Recommendation/Assessment Patient needs continued PT services  PT Visit Diagnosis Other abnormalities of gait and mobility (R26.89)  PT Problem List Decreased balance;Decreased mobility;Decreased knowledge of precautions;Decreased knowledge of use of DME;Pain  PT Plan  PT Frequency (ACUTE ONLY) Min 3X/week  PT Treatment/Interventions (ACUTE ONLY) DME instruction;Gait training;Stair training;Therapeutic activities;Functional mobility training;Therapeutic exercise;Balance training;Patient/family education  AM-PAC PT "6 Clicks" Daily Activity Outcome Measure  Difficulty turning over in bed (including adjusting bedclothes, sheets and blankets)? 4  Difficulty moving from lying on back to sitting on the side of the bed?  3  Difficulty sitting down on and  standing up from a chair with arms (e.g., wheelchair, bedside commode, etc,.)? 1  Help needed moving to and from a bed to chair (including a wheelchair)? 3  Help needed walking in hospital room? 3  Help needed climbing 3-5 steps with a railing?  2  6 Click Score 16  Mobility G Code  CK  PT Recommendation  Follow Up Recommendations No PT follow up;Supervision for mobility/OOB  PT equipment Other (comment) (RW vs crutches )  Individuals Consulted  Consulted and Agree with Results and Recommendations Patient  Acute Rehab PT Goals  Patient Stated Goal talk to the doctor  PT Goal Formulation With patient  Time For Goal Achievement 02/17/18  Potential to Achieve Goals Good  PT Time Calculation  PT Start Time (ACUTE ONLY) 1457  PT Stop Time (ACUTE ONLY) 1515  PT Time Calculation (min) (ACUTE ONLY) 18 min  PT General Charges  $$ ACUTE PT VISIT 1 Visit  PT Evaluation  $PT Eval Low Complexity 1 Low    Pt s/p surgery above with deficits below. Pt requiring supervision to min guard for safety during mobility with RW. Pt becoming agitated when OT/PT was discussing RLE NWB and infection prevention via not getting dressing wet during bathing; pt reporting "this is BS, I can't do this (in reference to being NWB and using RW/crutches), this doesn't fit my lifestyle"; "I wish I would have known not to get my foot wet before surgery, then it may not have gotten infected, this could be a lawsuit". Educated pt on POST-op precautions vs PRE-op precautions, but pt does not seem to understand this difference. Pt reports he will have intermittent assist at home. Will need to ensure safety with stair navigation during next session. Will continue to follow acutely to maximize functional mobility independence and safety.   Leighton Ruff, PT, DPT  Acute Rehabilitation Services  Pager: [phone]

## 2018-02-03 NOTE — Care Management (Signed)
Received a call back from [facility], they will not accept patient back. Caryl Pina stated her staff reported that patient became "hostile " while they were in his home. Due to safety issues unable to arrange home health. Dr Ronalee Red aware.  Magdalen Spatz RN BSN

## 2018-02-04 DIAGNOSIS — T82818A Embolism of vascular prosthetic devices, implants and grafts, initial encounter: Secondary | ICD-10-CM

## 2018-02-04 DIAGNOSIS — N186 End stage renal disease: Secondary | ICD-10-CM

## 2018-02-04 DIAGNOSIS — E1122 Type 2 diabetes mellitus with diabetic chronic kidney disease: Secondary | ICD-10-CM

## 2018-02-04 DIAGNOSIS — B9689 Other specified bacterial agents as the cause of diseases classified elsewhere: Secondary | ICD-10-CM

## 2018-02-04 DIAGNOSIS — B9561 Methicillin susceptible Staphylococcus aureus infection as the cause of diseases classified elsewhere: Secondary | ICD-10-CM

## 2018-02-04 DIAGNOSIS — I120 Hypertensive chronic kidney disease with stage 5 chronic kidney disease or end stage renal disease: Secondary | ICD-10-CM

## 2018-02-04 DIAGNOSIS — Z992 Dependence on renal dialysis: Secondary | ICD-10-CM

## 2018-02-04 DIAGNOSIS — T82868A Thrombosis of vascular prosthetic devices, implants and grafts, initial encounter: Secondary | ICD-10-CM

## 2018-02-04 DIAGNOSIS — Z89421 Acquired absence of other right toe(s): Secondary | ICD-10-CM

## 2018-02-04 LAB — GLUCOSE, CAPILLARY
Glucose-Capillary: 229 mg/dL — ABNORMAL HIGH (ref 65–99)
Glucose-Capillary: 237 mg/dL — ABNORMAL HIGH (ref 65–99)
Glucose-Capillary: 329 mg/dL — ABNORMAL HIGH (ref 65–99)
Glucose-Capillary: 233 mg/dL — ABNORMAL HIGH (ref 65–99)

## 2018-02-04 LAB — CBC
HCT: 25.4 % — ABNORMAL LOW (ref 39.0–52.0)
Hemoglobin: 8.1 g/dL — ABNORMAL LOW (ref 13.0–17.0)
MCH: 28 pg (ref 26.0–34.0)
MCHC: 31.9 g/dL (ref 30.0–36.0)
MCV: 87.9 fL (ref 78.0–100.0)
Platelets: 231 10*3/uL (ref 150–400)
RBC: 2.89 MIL/uL — ABNORMAL LOW (ref 4.22–5.81)
RDW: 14.8 % (ref 11.5–15.5)
WBC: 9.9 10*3/uL (ref 4.0–10.5)

## 2018-02-04 LAB — RENAL FUNCTION PANEL
Albumin: 2.4 g/dL — ABNORMAL LOW (ref 3.5–5.0)
Anion gap: 12 (ref 5–15)
BUN: 41 mg/dL — ABNORMAL HIGH (ref 6–20)
CO2: 26 mmol/L (ref 22–32)
Calcium: 9 mg/dL (ref 8.9–10.3)
Chloride: 92 mmol/L — ABNORMAL LOW (ref 101–111)
Creatinine, Ser: 9.76 mg/dL — ABNORMAL HIGH (ref 0.61–1.24)
GFR calc Af Amer: 7 mL/min — ABNORMAL LOW (ref >60)
GFR calc non Af Amer: 6 mL/min — ABNORMAL LOW (ref >60)
Glucose, Bld: 346 mg/dL — ABNORMAL HIGH (ref 65–99)
Phosphorus: 5.1 mg/dL — ABNORMAL HIGH (ref 2.5–4.6)
Potassium: 4.9 mmol/L (ref 3.5–5.1)
Sodium: 130 mmol/L — ABNORMAL LOW (ref 135–145)

## 2018-02-04 MED ORDER — SEVELAMER CARBONATE 800 MG PO TABS
4000.0000 mg | ORAL_TABLET | Freq: Three times a day (TID) | ORAL | Status: DC
  Administered 2018-02-04 – 2018-02-05 (×3): 4000 mg via ORAL
  Filled 2018-02-04 (×4): qty 5

## 2018-02-04 MED ORDER — METRONIDAZOLE 500 MG PO TABS
500.0000 mg | ORAL_TABLET | Freq: Three times a day (TID) | ORAL | Status: DC
  Administered 2018-02-04 – 2018-02-05 (×3): 500 mg via ORAL
  Filled 2018-02-04 (×3): qty 1

## 2018-02-04 MED ORDER — METRONIDAZOLE 500 MG PO TABS: 500.0000 mg | ORAL_TABLET | Freq: Three times a day (TID) | ORAL | 0 refills | Status: AC

## 2018-02-04 MED ORDER — VANCOMYCIN HCL IN DEXTROSE 750-5 MG/150ML-% IV SOLN: 750.0000 mg | INTRAVENOUS | Status: AC

## 2018-02-04 MED ORDER — OXYCODONE HCL 5 MG PO TABS: 5.0000 mg | ORAL_TABLET | Freq: Four times a day (QID) | ORAL | 0 refills | Status: AC | PRN

## 2018-02-04 MED ORDER — INSULIN GLARGINE 100 UNIT/ML ~~LOC~~ SOLN: 10.0000 [IU] | Freq: Every day | SUBCUTANEOUS | 11 refills | Status: DC

## 2018-02-04 MED ORDER — INSULIN GLARGINE 100 UNIT/ML ~~LOC~~ SOLN
12.0000 [IU] | Freq: Every day | SUBCUTANEOUS | Status: DC
  Administered 2018-02-04: 12 [IU] via SUBCUTANEOUS
  Filled 2018-02-04: qty 0.12

## 2018-02-04 MED ORDER — HYDROMORPHONE HCL 2 MG/ML IJ SOLN
1.0000 mg | Freq: Two times a day (BID) | INTRAMUSCULAR | Status: DC | PRN
  Administered 2018-02-05 (×2): 1 mg via INTRAVENOUS
  Filled 2018-02-04 (×2): qty 1

## 2018-02-04 NOTE — Progress Notes (Signed)
Physical Therapy Treatment Patient Details Name: [name] MRN: [id number] DOB: 16-Aug-1981 Today's Date: 02/04/2018    History of Present Illness Pt is a 37 y.o. male s/p R 5th toe and metatarsal amputation. PMHx: End stage renal disease on HD, DM 1, diabetic foot ulcers and gastroparesis, HTN.    PT Comments    Session focused extensively on crutch and stair training. At this time patient unable to demonstrate safety with crutches and was advised to remain utilizing RW. Pt agreed with therapy that he is unsafe on crutches and verbalized he would do so after d/c.    Follow Up Recommendations  No PT follow up;Supervision for mobility/OOB     Equipment Recommendations  Other (comment)    Recommendations for Other Services       Precautions / Restrictions Precautions Precautions: Fall Restrictions Weight Bearing Restrictions: Yes RLE Weight Bearing: Non weight bearing    Mobility  Bed Mobility Overal bed mobility: Modified Independent                Transfers Overall transfer level: Needs assistance Equipment used: Rolling walker (2 wheeled) Transfers: Sit to/from Stand Sit to Stand: Supervision         General transfer comment: cues for NWB bearing, pt able to rise without physical assistance  Ambulation/Gait Ambulation/Gait assistance: Min guard;Supervision Ambulation Distance (Feet): 100 Feet Assistive device: Crutches;Rolling walker (2 wheeled) Gait Pattern/deviations: Step-to pattern Gait velocity: decreased   General Gait Details: Patient trialed crutches today, min guard with several LOB. discussed that patient is much safer with RW at this time he agrees and found the crutches to be more challanging then he anticpiated.    Stairs Stairs: Yes Stairs assistance: Mod assist;Min guard Stair Management: With crutches Number of Stairs: 12 General stair comments: patient unsteady on stairs with crtuches after several attemps, cues for sequencing  and techinque. unable to progress this visit to supervision, at times requirng mod A to stabilize with LOBs. instructed patient to utilize scooting method, he agrees that is what he'll do    Wheelchair Mobility    Modified Rankin (Stroke Patients Only)       Balance Overall balance assessment: Mild deficits observed, not formally tested                                          Cognition Arousal/Alertness: Awake/alert Behavior During Therapy: WFL for tasks assessed/performed Overall Cognitive Status: Within Functional Limits for tasks assessed                                        Exercises      General Comments        Pertinent Vitals/Pain Pain Assessment: Faces Faces Pain Scale: Hurts a little bit Pain Location: R foot Pain Descriptors / Indicators: Aching;Sore Pain Intervention(s): Limited activity within patient's tolerance;Monitored during session;Premedicated before session;Repositioned    Home Living                      Prior Function            PT Goals (current goals can now be found in the care plan section) Acute Rehab PT Goals Patient Stated Goal: return home PT Goal Formulation: With patient Time For Goal Achievement: 02/17/18 Potential to Achieve  Goals: Good Progress towards PT goals: Progressing toward goals    Frequency    Min 3X/week      PT Plan Current plan remains appropriate    Co-evaluation              AM-PAC PT "6 Clicks" Daily Activity  Outcome Measure  Difficulty turning over in bed (including adjusting bedclothes, sheets and blankets)?: None Difficulty moving from lying on back to sitting on the side of the bed? : A Little Difficulty sitting down on and standing up from a chair with arms (e.g., wheelchair, bedside commode, etc,.)?: Unable Help needed moving to and from a bed to chair (including a wheelchair)?: A Little Help needed walking in hospital room?: A Little Help  needed climbing 3-5 steps with a railing? : A Lot 6 Click Score: 16    End of Session Equipment Utilized During Treatment: Gait belt Activity Tolerance: Patient tolerated treatment well Patient left: in bed;with call bell/phone within reach Nurse Communication: Mobility status PT Visit Diagnosis: Other abnormalities of gait and mobility (R26.89)     Time: 1200-1240 PT Time Calculation (min) (ACUTE ONLY): 40 min  Charges:  $Gait Training: 23-37 mins                    G Codes:      Reinaldo Berber, PT, DPT Acute Rehab Services Pager: [phone]    Reinaldo Berber 02/04/2018, 2:03 PM

## 2018-02-04 NOTE — Progress Notes (Signed)
Patient arrived to HD unit via bed @ 1445.  Immediately upon arrival, he asked for a supervisor/chg RN.  Kennon Rounds, RN Chg RN to bay 9 to speak to pt.  He was very agitated and speaking in a loud voice, complaining that he was not happy that he was not here first thing in the am.  Ronny Bacon, RN Program Manager, came to patient's bedside due to noise level from patient.  He continued to angrily demand that he should have been dialyzed first shift.  Program Manager attempted to explain to patient that inpatient services does not have routine appt times, and depending on acuity of other patients, he may not be able to come to trmt first shift.  He did agree to allow Ronny Bacon, RN to cannulate him so that trmt could begin.  She was unable to cannulate venous portion due to continuous clot burden.  D Zeyfang, PA-c made aware and is making arrangements for declot.  Patient continues to Press photographer, continually stating his displeasure that things are not going the way he had wanted them to.

## 2018-02-04 NOTE — Progress Notes (Signed)
Inpatient Diabetes Program Recommendations  AACE/ADA: New Consensus Statement on Inpatient Glycemic Control (2015)  Target Ranges:  Prepandial:   less than 140 mg/dL      Peak postprandial:   less than 180 mg/dL (1-2 hours)      Critically ill patients:  140 - 180 mg/dL   Lab Results  Component Value Date   GLUCAP 237 (H) 02/04/2018   HGBA1C 6.2 (H) 10/26/2017    Review of Glycemic ControlResults for [name], [name] (MRN [id number]) as of 02/04/2018 14:22  Ref. Range 02/03/2018 11:48 02/03/2018 16:11 02/03/2018 21:04 02/04/2018 08:11 02/04/2018 12:39  Glucose-Capillary Latest Ref Range: 65 - 99 mg/dL 240 (H) 268 (H) 290 (H) 229 (H) 237 (H)    Diabetes history: Type 2 DM  Outpatient Diabetes medications:  Novolog 5 units tid with meals, Lantus 6 units daily Current orders for Inpatient glycemic control:  Novolog moderate tid with meals and HS, Lantus 12 units daily  Inpatient Diabetes Program Recommendations:   May consider reducing Novolog correction to sensitive tid with meals.  Also consider adding Novolog meal coverage 3 units tid with meals (hold if patient eats less than 50%).   Thanks,  Adah Perl, RN, BC-ADM Inpatient Diabetes Coordinator Pager [phone] (8a-5p)

## 2018-02-04 NOTE — Plan of Care (Signed)
Culture results reviewed - positive bone and soft tissue cultures. Currently growing SA, sensitivities to follow. Due to positive bone cultures patient would benefit from 6w of IV abx, likely dosed with HD.    Final abx recc based on sensitivities.  Made appt for f/u with patient in the office next Thursday at 10:45am.  Evelina Bucy, DPM [phone] with questions or concerns.

## 2018-02-04 NOTE — Consult Note (Signed)
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 Surgical cultures are positive for Staphylococcus aureas with sensitivities pending. First culture was obtained from the right 5 toe, however 2nd culture indicates soft tissue of right foot. Blood cultures drawn on 5/7 with no growth to date. He has been afebrile since admission. Currently receiving vancomycin and zosyn (Day 4) with dialysis. HIV screen negative in January 2019.   1. Discontinue Zoysn. Continue with vancomycin pending sensitivities with changes of antibiotics as needed pending results. Would recommend a 2 week course of vancomycin given amputation and cultures.  2. Continue to monitor blood cultures.    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 amLODipine  10 mg Oral Daily  . calcitRIOL  1.75 mcg Oral Daily  . cinacalcet  30 mg Oral Q supper  . darbepoetin (ARANESP) injection - DIALYSIS  40 mcg Intravenous Q Wed-HD  . heparin  9,000 Units Dialysis Once in dialysis  . insulin aspart  0-15 Units Subcutaneous TID WC  . insulin aspart  0-5 Units Subcutaneous QHS  . insulin glargine  12 Units Subcutaneous QHS  . sevelamer carbonate  4,000 mg Oral BID WC  . sodium chloride flush  3 mL Intravenous Q12H     HPI:  [name] is a 36 y.o. male with previous medical history of ESRD on dialysis, diabetes and hypertension who was admitted to the hospital on 5/6 with the chief complaint of a diabetic foot ulcer. He had been followed by Dr. March Rummage of [facility].   X-rays and MRI showed osteomyelitis of the fifth metatarsal head and fifth proximal phalanx with severe marrow edema. On 5/8 underwent right fifth toe and metatarsal amputation. Surgical cultures obtained on 5/8 were positive for staphylocccus aureas with susceptibilities pending.  Continues to have pain located in his right foot. States that he has decreased sensation and this all started when he stepped on something and was not aware of it. Describes a secondary wound around the first metatarsal. Denies fevers, chills, or night sweats. Eager to go home following dialysis today.     Review of Systems: Review of Systems  Constitutional: Negative for chills and fever.  Respiratory: Negative for cough, sputum production and shortness of breath.   Cardiovascular: Negative for chest pain and leg swelling.  Gastrointestinal: Negative for abdominal pain, constipation, diarrhea, nausea and vomiting.  Musculoskeletal:       Positive for right foot pain.   Skin: Negative for rash.     Past Medical History:  Diagnosis Date  . Diabetes mellitus without complication ([place])   . Diabetic gastroparesis ([place])   . Dialysis patient ([place])   .  Hypertension   . Renal disorder    Dialysis  . Sepsis [facility])     Social History   Tobacco Use  . Smoking status: Never Smoker  . Smokeless tobacco: Never Used  Substance Use Topics  . Alcohol use: No  . Drug use: No    Family History  Problem Relation Age of Onset  . Diabetes Mellitus II Unknown     Allergies  Allergen Reactions  . No Known Allergies     OBJECTIVE: Blood pressure (!) 152/88, pulse 84, temperature 98.6 F (37 C), temperature source Oral, resp. rate 20, weight 162 lb 0.6 oz  (73.5 kg), SpO2 98 %.  Physical Exam  Constitutional: He is oriented to person, place, and time. He appears well-developed and well-nourished. No distress.  Sitting on the side of the bed. Pleasant  Cardiovascular: Normal rate, regular rhythm, normal heart sounds and intact distal pulses. Exam reveals no gallop and no friction rub.  No murmur heard. Pulmonary/Chest: Effort normal and breath sounds normal. No stridor. No respiratory distress. He has no wheezes. He has no rales. He exhibits no tenderness.  Musculoskeletal:  Surgical dressing appears intact and dry. There is scant pink drainage on the underlying exposed dressing. Capillary refill is intact.   Neurological: He is alert and oriented to person, place, and time.  Skin: Skin is warm and dry.  Psychiatric: He has a normal mood and affect. His behavior is normal. Judgment and thought content normal.    Lab Results Lab Results  Component Value Date   WBC 10.0 02/02/2018   HGB 9.6 (L) 02/02/2018   HCT 31.0 (L) 02/02/2018   MCV 89.6 02/02/2018   PLT 261 02/02/2018    Lab Results  Component Value Date   CREATININE 8.99 (H) 02/02/2018   BUN 43 (H) 02/02/2018   NA 132 (L) 02/02/2018   K 4.9 02/02/2018   CL 93 (L) 02/02/2018   CO2 27 02/02/2018    Lab Results  Component Value Date   ALT 10 (L) 01/31/2018   AST 15 01/31/2018   ALKPHOS 120 01/31/2018   BILITOT 0.5 01/31/2018     Microbiology: Recent Results (from the past 240 hour(s))  Culture, blood (routine x 2)     Status: None (Preliminary result)   Collection Time: 02/01/18 10:00 AM  Result Value Ref Range Status   Specimen Description BLOOD BLOOD LEFT HAND  Final   Special Requests   Final    BOTTLES DRAWN AEROBIC AND ANAEROBIC Blood Culture adequate volume   Culture   Final    NO GROWTH 3 DAYS Performed at [facility], [place] [address], [place], [place] [address]    Report Status PENDING  Incomplete  Culture, blood (routine x 2)     Status: None  (Preliminary result)   Collection Time: 02/01/18 10:00 AM  Result Value Ref Range Status   Specimen Description BLOOD BLOOD LEFT HAND  Final   Special Requests   Final    BOTTLES DRAWN AEROBIC AND ANAEROBIC Blood Culture adequate volume   Culture   Final    NO GROWTH 3 DAYS Performed at [facility], [place] [address] [place], [place] [address]    Report Status PENDING  Incomplete  MRSA PCR Screening     Status: None   Collection Time: 02/02/18  8:35 AM  Result Value Ref Range Status   MRSA by PCR NEGATIVE NEGATIVE Final    Comment:        The  GeneXpert MRSA Assay (FDA approved for NASAL specimens only), is one component of a comprehensive MRSA colonization surveillance program. It is not intended to diagnose MRSA infection nor to guide or monitor treatment for MRSA infections. Performed at [facility], [place] [address] [place], [place] [address]   Aerobic/Anaerobic Culture (surgical/deep wound)     Status: None (Preliminary result)   Collection Time: 02/02/18  2:38 PM  Result Value Ref Range Status   Specimen Description TISSUE RIGHT FOOT  Final   Special Requests SPEC A  Final   Gram Stain   Final    MODERATE WBC PRESENT,BOTH PMN AND MONONUCLEAR NO ORGANISMS SEEN    Culture   Final    RARE STAPHYLOCOCCUS AUREUS HOLDING FOR POSSIBLE ANAEROBE Performed at [facility], [place] [address], [place], [place] [address]    Report Status PENDING  Incomplete  Aerobic/Anaerobic Culture (surgical/deep wound)     Status: None (Preliminary result)   Collection Time: 02/02/18  2:42 PM  Result Value Ref Range Status   Specimen Description TISSUE  Final   Special Requests RIGHT FOOT FIFTH TOE SPEC B  Final   Gram Stain   Final    MODERATE WBC PRESENT, PREDOMINANTLY MONONUCLEAR NO ORGANISMS SEEN Performed at [facility], [place] [address] [place], [place] [address]    Culture   Final    RARE STAPHYLOCOCCUS AUREUS SUSCEPTIBILITIES TO FOLLOW NO ANAEROBES  ISOLATED; CULTURE IN PROGRESS FOR 5 DAYS    Report Status PENDING  Incomplete     Terri Piedra, NP [facility] Infectious [facility] [phone] Pager  02/04/2018  12:34 PM

## 2018-02-04 NOTE — Progress Notes (Addendum)
Subjective:  [name] was seen lying in bed comfortably this morning. He continues to have pain in his RLE but it is tolerable. He is very eager to go home before Mother's Day. We discussed that we will get ID involved today to determine need for IV vs PO antibiotics, which will determine when he will be able to go home. He also requested to go for the first shift of dialysis, discussed that we unfortunately do not have control over this.  Objective:  Vital signs in last 24 hours: Vitals:   02/03/18 1031 02/03/18 1308 02/03/18 2107 02/04/18 0604  BP: (!) 147/84 130/78 (!) 144/83 (!) 152/88  Pulse: 91 91 89 84  Resp: 18 20    Temp: 99 F (37.2 C) 98.8 F (37.1 C) 99.2 F (37.3 C) 98.6 F (37 C)  TempSrc: Oral Oral Oral Oral  SpO2: 100% 100% 99% 98%  Weight:       GEN: Lying in bed in NAD CV: NR & RR, no m/r/g PULM: CTAB, no wheezes or rales in anterior lung fields MSK: Right foot bandaged. 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 Currently on empiric IV vanc/zosyn (5/9 -> present), will get ID consult to determine best course for antibiotics. BCx negative, but surgical culture growing rare Staph aureus.  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 Pain managed with PO meds. Currently on day 2 of IV vanc/zosyn for  empiric post-op coverage per IDSA guidelines and narrow according to culture results. Surgical culture growing rare Staph aureus. Continuing to wean IV narcotics in preparation for discharge. - Podiatry consulted; appreciate their assistance - ID consulted; appreciate their assistance - Wound VAC d/c'ed - Continue Oxy IR 5mg  q4h PRN for moderate-severe pain - Decrease to IV dilaudid 1mg  q12h PRN for breakthrough pain - F/u BCx 5/7 -> NG x3d - F/u intraoperative Cx 5/8 -> Rare staph aureus - Will likely need prolonged IV Abx, maybe with dialysis - PT/OT eval - Continue IV vanc/zosyn (5/9->present) - F/u with outpt podiatry on 5/16  Chronic nocturnal fecal incontinence Does not have diarrhea, bloody stools, or abdominal pain. - Outpatient f/u  Type 1 DM Patient's home medications listed as Lantus 20u QHS and Novolog SSI. Patient reported he was only taking 6 units of lantus at home. Most recent BG have been elevated. Most recent A1c in 09/2017 was 6.2 - Increase Lantus to 12u QHS - SSI-M TID WC - CBG monitoring  ESRD on HD MWF Adherent to dialysis - Nephrology consulted for inpatient HD; appreciate their assistance - Continue renvela and sensipar - HD today  Hx of HTN BP 152/88. Home regimen includes amlodipine 10mg  daily and losartan 50mg  daily - Holding home losartan, can restart if BP remains elevated - Continue home amlodipine 10mg  daily  Anemia Likely anemia of chronic renal  disease with Hb near baseline. - CBC in AM  Dispo: Anticipated discharge in approximately 0-1 day(s).  Colbert Ewing, MD 02/04/2018, 6:55 AM Pager: Mamie Nick [phone]

## 2018-02-04 NOTE — Progress Notes (Signed)
Internal Medicine Attending  Date: 02/04/2018  Patient name: [name] Medical record number: [id number] Date of birth: 11/13/1980 Age: 37 y.o. Gender: male  I saw and evaluated the patient. I reviewed the resident's note by Dr. Ronalee Red and I agree with the resident's findings and plans as documented in her progress note.  When seen on rounds this morning [name] was disappointed he did not get hemodialysis during the first shift. We explained to him we needed to also have ID's input as to the appropriate antibiotic regimen upon discharge. We appreciate the recommendations. When in hemodialysis he apparently had clotting in his graft. Therefore, his hemodialysis run could not be completed. Nephrology has made arrangements for clot removal, but [name] will need to stay an additional day in order to complete his hemodialysis session. I anticipate he will be ready for discharge once that is completed. He will receive IV vancomycin for 2 weeks with hemodialysis and 2 weeks of Flagyl.

## 2018-02-04 NOTE — Progress Notes (Signed)
Patient was concerned with plan of care and discharge, he want further explanations. MD was notified and will come speak with patient, patient notified and agreed. Patient will continue to be monitored.

## 2018-02-04 NOTE — Progress Notes (Signed)
Orthopedic Tech Progress Note Patient Details:  [name] March 24, 1981 [id number]  Ortho Devices Type of Ortho Device: Postop shoe/boot Ortho Device/Splint Interventions: Application   Post Interventions Patient Tolerated: Well Instructions Provided: Care of device   [place] Pink 02/04/2018, 5:11 PM

## 2018-02-04 NOTE — Progress Notes (Signed)
Occupational Therapy Treatment Patient Details Name: [name] MRN: [id number] DOB: 09/26/1981 Today's Date: 02/04/2018    History of present illness Pt is a 37 y.o. male s/p R 5th toe and metatarsal amputation. PMHx: End stage renal disease on HD, DM 1, diabetic foot ulcers and gastroparesis, HTN.   OT comments  Pt agitated upon arrival; c/o not being taken to HD this AM. Reeducated pt on role of therapy and promoting functional independence; pt then agreeable to participate today and was pleasant throughout rest of session. Educated pt on tub transfer technique with use of 3 in 1 and maintaining RLE NWB; pt verbalized understanding of set up and technique. Educated pt on importance of maintaining NWB during functional activities upon return home and importance of not getting dressing wet during bathing; pt verbalized understanding. D/c plan remains appropriate. Will continue to follow acutely.   Follow Up Recommendations  No OT follow up;Supervision - Intermittent    Equipment Recommendations  3 in 1 bedside commode(for use as shower chair)    Recommendations for Other Services      Precautions / Restrictions Precautions Precautions: Fall Restrictions Weight Bearing Restrictions: Yes RLE Weight Bearing: Non weight bearing       Mobility Bed Mobility Overal bed mobility: Modified Independent                Transfers                      Balance                                           ADL either performed or assessed with clinical judgement   ADL Overall ADL's : Needs assistance/impaired                                   Tub/Shower Transfer Details (indicate cue type and reason): Extensivly educated pt on tub transfer technique with 3 in 1 and maintaining NWB RLE. Pt declines to practice at this time but verblizes understanding of set up and transfer technique   General ADL Comments: Educated pt on importance of  maintaining RLE NWB and keeping foot dry when showering, recommended using 2 plastic bags and tapping off at the top to prevent moisture; pt verbalizes understanding of both.     Vision       Perception     Praxis      Cognition Arousal/Alertness: Awake/alert Behavior During Therapy: WFL for tasks assessed/performed Overall Cognitive Status: Within Functional Limits for tasks assessed                                          Exercises     Shoulder Instructions       General Comments      Pertinent Vitals/ Pain       Pain Assessment: Faces Faces Pain Scale: Hurts a little bit Pain Location: R foot Pain Descriptors / Indicators: Aching;Sore Pain Intervention(s): Limited activity within patient's tolerance;Monitored during session  Home Living  Prior Functioning/Environment              Frequency  Min 2X/week        Progress Toward Goals  OT Goals(current goals can now be found in the care plan section)  Progress towards OT goals: Progressing toward goals  Acute Rehab OT Goals Patient Stated Goal: return home OT Goal Formulation: With patient  Plan Discharge plan remains appropriate    Co-evaluation                 AM-PAC PT "6 Clicks" Daily Activity     Outcome Measure   Help from another person eating meals?: None Help from another person taking care of personal grooming?: None Help from another person toileting, which includes using toliet, bedpan, or urinal?: A Little Help from another person bathing (including washing, rinsing, drying)?: A Little Help from another person to put on and taking off regular upper body clothing?: None Help from another person to put on and taking off regular lower body clothing?: A Little 6 Click Score: 21    End of Session    OT Visit Diagnosis: Unsteadiness on feet (R26.81);Other abnormalities of gait and mobility  (R26.89);Pain Pain - Right/Left: Right Pain - part of body: Ankle and joints of foot   Activity Tolerance Patient tolerated treatment well   Patient Left in bed;with call bell/phone within reach   Nurse Communication          Time: 1124-1140 OT Time Calculation (min): 16 min  Charges: OT General Charges $OT Visit: 1 Visit OT Treatments $Self Care/Home Management : 8-22 mins  Kavya Haag A. Ulice Brilliant, M.S., OTR/L Pager: [place] 02/04/2018, 11:45 AM

## 2018-02-04 NOTE — Progress Notes (Addendum)
Subjective:  In [place] with PT/ HD Pending "im ready to go home after HD "  Objective Vital signs in last 24 hours: Vitals:   02/03/18 1031 02/03/18 1308 02/03/18 2107 02/04/18 0604  BP: (!) 147/84 130/78 (!) 144/83 (!) 152/88  Pulse: 91 91 89 84  Resp: 18 20    Temp: 99 F (37.2 C) 98.8 F (37.1 C) 99.2 F (37.3 C) 98.6 F (37 C)  TempSrc: Oral Oral Oral Oral  SpO2: 100% 100% 99% 98%  Weight:       Weight change:   Physical Exam: General: alert nad  AAM Heart: RRR, no mrg Lungs: CTA Abdomen: Soft , NT, ND Extremities: L foot clean dry bandage/ no pedal; edema   Dialysis Access: pos bruit  RUA AVG   Dialysis Orders:[facility] MWF 4 hrs 180 NRe 400/800 73.5 kg 2.0 K/ 2.0 Ca  -Heparin 9000 units IV TIW -Mircera 75 mcg IV q 2 weeks (New dosing-hasn't rec'd yet.)  -Calcitriol 1.75 mcg PO TIW   Problem/Plan: 1. Osteomyelitis R Foot. S/p R 5th toe and metatarsal amputation 02/02/18. Has rec'd Vanc/Cefepime. Per primary/Dr. March Rummage.For wound vac but unable to get adequate seal./ Dr Erenest Rasher noted needs IV antib  Op [facility] for   Discharge today afrer HD  . 2. ESRD - MWF HD on schedule. 3. Hypertension/volume -Hypertensive at admit . UF  tolerating well. Losartan 50 mg on OP med list however on amlodipine 10 mg PO here. (amlodipine not on OP med list).at edw last hd  Fu post wt today /?loweredw at dc . bp now controlled  4. Anemia - HGB 9.6 give Aranesp 40 mcg IV with HD wed 5/08/ continue same esa =56mc g Mircera next  As op 1/61.  5. Metabolic bone disease - Ca 8.7 C Ca 10. Phos 4.7. Continue binders, senispar, VDRA. 6. Nutrition - renal diet 7. DM: per primary  Addendum = Attempted HD and AVGG =  Not functional,venous side not able to cannulate  unfortunately  Pt refusing  To stay for any Procedure / per pt "Im going   Home not waiting for that."  BUT then changes his mind/ Will notify IR .  [name], PA-C [facility] Bee[facility] [phone] 02/04/2018,12:20  PM  LOS: 3 days   Labs: Basic Metabolic Panel: Recent Labs  Lab 01/31/18 2248 02/02/18 0731  NA 136 132*  K 4.3 4.9  CL 91* 93*  CO2 32 27  GLUCOSE 68 146*  BUN 23* 43*  CREATININE 6.26* 8.99*  CALCIUM 9.1 8.7*  PHOS  --  4.7*   Liver Function Tests: Recent Labs  Lab 01/31/18 2248 02/02/18 0731  AST 15  --   ALT 10*  --   ALKPHOS 120  --   BILITOT 0.5  --   PROT 8.0  --   ALBUMIN 3.0* 2.4*   No results for input(s): LIPASE, AMYLASE in the last 168 hours. No results for input(s): AMMONIA in the last 168 hours. CBC: Recent Labs  Lab 01/31/18 2248 02/02/18 0731  WBC 12.9* 10.0  NEUTROABS 9.8*  --   HGB 9.9* 9.6*  HCT 31.4* 31.0*  MCV 91.3 89.6  PLT 295 261   Cardiac Enzymes: No results for input(s): CKTOTAL, CKMB, CKMBINDEX, TROPONINI in the last 168 hours. CBG: Recent Labs  Lab 02/03/18 0739 02/03/18 1148 02/03/18 1611 02/03/18 2104 02/04/18 0811  GLUCAP 211* 240* 268* 290* 229*    Studies/Results: Dg Foot Complete Right  Result Date: 02/02/2018 CLINICAL DATA:  Recent right foot surgery. EXAM: RIGHT FOOT COMPLETE - 3+ VIEW COMPARISON:  01/31/2018 FINDINGS: Amputation of the fifth toe and partial amputation of the fifth metatarsal bone. The proximal fifth metatarsal bone is still present. There is a focal lucency along the plantar and lateral aspect of the proximal fourth metatarsal bone which probably represents postsurgical changes. Extensive atherosclerotic calcifications. Surgical staples along the lateral aspect of the foot along with a wound VAC. IMPRESSION: Postoperative changes compatible with fifth toe amputation and partial amputation of the fifth metatarsal bone. Lucency involving the proximal fourth metatarsal bone. This is new from the previous examination and compatible with postoperative changes. Electronically Signed   By: Markus Daft M.D.   On: 02/02/2018 19:10   Medications: . sodium chloride    . sodium chloride    . sodium chloride 10  mL/hr at 02/02/18 1851  . piperacillin-tazobactam (ZOSYN)  IV 3.375 g (02/04/18 0920)  . vancomycin     . amLODipine  10 mg Oral Daily  . calcitRIOL  1.75 mcg Oral Daily  . cinacalcet  30 mg Oral Q supper  . darbepoetin (ARANESP) injection - DIALYSIS  40 mcg Intravenous Q Wed-HD  . heparin  9,000 Units Dialysis Once in dialysis  . insulin aspart  0-15 Units Subcutaneous TID WC  . insulin aspart  0-5 Units Subcutaneous QHS  . insulin glargine  12 Units Subcutaneous QHS  . sevelamer carbonate  4,000 mg Oral BID WC  . sodium chloride flush  3 mL Intravenous Q12H    I have seen and examined this patient and agree with plan and assessment in the above note with renal recommendations/intervention highlighted.  Pt became angry and belligerent using profanity for no clear reason.  I discussed the plan for HD and IV abx per ID recommendations and that he will go to HD as soon as they can.  He threatened to leave AMA, however I reminded him the reason there is a delay in establishing [place] is due to his abusive behavior with his last agency. Governor Rooks Dwyne Hasegawa,MD 02/04/2018 3:42 PM

## 2018-02-05 ENCOUNTER — Inpatient Hospital Stay (HOSPITAL_COMMUNITY): Payer: Medicare Other

## 2018-02-05 ENCOUNTER — Encounter (HOSPITAL_COMMUNITY): Payer: Self-pay | Admitting: Radiology

## 2018-02-05 HISTORY — PX: IR AV DIALY SHUNT INTRO NEEDLE/INTRACATH INITIAL W/PTA/IMG RIGHT: IMG6116

## 2018-02-05 HISTORY — PX: IR US GUIDE VASC ACCESS RIGHT: IMG2390

## 2018-02-05 LAB — CBC
HCT: 29.6 % — ABNORMAL LOW (ref 39.0–52.0)
Hemoglobin: 9.4 g/dL — ABNORMAL LOW (ref 13.0–17.0)
MCH: 28.1 pg (ref 26.0–34.0)
MCHC: 31.8 g/dL (ref 30.0–36.0)
MCV: 88.4 fL (ref 78.0–100.0)
Platelets: 266 10*3/uL (ref 150–400)
RBC: 3.35 MIL/uL — ABNORMAL LOW (ref 4.22–5.81)
RDW: 14.9 % (ref 11.5–15.5)
WBC: 11.6 10*3/uL — ABNORMAL HIGH (ref 4.0–10.5)

## 2018-02-05 LAB — GLUCOSE, CAPILLARY
Glucose-Capillary: 185 mg/dL — ABNORMAL HIGH (ref 65–99)
Glucose-Capillary: 262 mg/dL — ABNORMAL HIGH (ref 65–99)
Glucose-Capillary: 231 mg/dL — ABNORMAL HIGH (ref 65–99)

## 2018-02-05 LAB — PROTIME-INR
INR: 1
Prothrombin Time: 13.1 seconds (ref 11.4–15.2)

## 2018-02-05 MED ORDER — LIDOCAINE HCL 1 % IJ SOLN
INTRAMUSCULAR | Status: AC
  Filled 2018-02-05: qty 20

## 2018-02-05 MED ORDER — ALTEPLASE 2 MG IJ SOLR
INTRAMUSCULAR | Status: AC
  Filled 2018-02-05: qty 4

## 2018-02-05 MED ORDER — INSULIN GLARGINE 100 UNIT/ML ~~LOC~~ SOLN
16.0000 [IU] | Freq: Every day | SUBCUTANEOUS | Status: DC
  Administered 2018-02-05: 16 [IU] via SUBCUTANEOUS
  Filled 2018-02-05: qty 0.16

## 2018-02-05 MED ORDER — LIDOCAINE HCL 1 % IJ SOLN
INTRAMUSCULAR | Status: AC | PRN
  Administered 2018-02-05: 5 mL

## 2018-02-05 MED ORDER — FENTANYL CITRATE (PF) 100 MCG/2ML IJ SOLN
INTRAMUSCULAR | Status: AC
  Filled 2018-02-05: qty 4

## 2018-02-05 MED ORDER — IOPAMIDOL (ISOVUE-300) INJECTION 61%
INTRAVENOUS | Status: AC
  Administered 2018-02-05: 25 mL
  Filled 2018-02-05: qty 100

## 2018-02-05 MED ORDER — MIDAZOLAM HCL 2 MG/2ML IJ SOLN
INTRAMUSCULAR | Status: AC
  Filled 2018-02-05: qty 4

## 2018-02-05 MED ORDER — INSULIN GLARGINE 100 UNIT/ML ~~LOC~~ SOLN: 10.0000 [IU] | Freq: Every day | SUBCUTANEOUS | 1 refills | Status: DC

## 2018-02-05 MED ORDER — CALCITRIOL 0.25 MCG PO CAPS
ORAL_CAPSULE | ORAL | Status: AC
  Filled 2018-02-05: qty 1

## 2018-02-05 MED ORDER — CALCITRIOL 0.5 MCG PO CAPS
ORAL_CAPSULE | ORAL | Status: AC
  Filled 2018-02-05: qty 3

## 2018-02-05 MED ORDER — VANCOMYCIN HCL IN DEXTROSE 750-5 MG/150ML-% IV SOLN
INTRAVENOUS | Status: AC
  Administered 2018-02-05: 750 mg via INTRAVENOUS
  Filled 2018-02-05: qty 150

## 2018-02-05 MED ORDER — FENTANYL CITRATE (PF) 100 MCG/2ML IJ SOLN
INTRAMUSCULAR | Status: AC | PRN
  Administered 2018-02-05: 50 ug via INTRAVENOUS

## 2018-02-05 MED ORDER — MIDAZOLAM HCL 2 MG/2ML IJ SOLN
INTRAMUSCULAR | Status: AC | PRN
  Administered 2018-02-05: 1 mg via INTRAVENOUS

## 2018-02-05 MED ORDER — HEPARIN SODIUM (PORCINE) 1000 UNIT/ML IJ SOLN
INTRAMUSCULAR | Status: AC
  Filled 2018-02-05: qty 1

## 2018-02-05 NOTE — Procedures (Signed)
Interventional Radiology Procedure Note  Hx: patient reports Wednesday session was fine.  Friday techs were unable to establish flowing access.  Never has had procedure on the circuit.  Placed years ago in [place].  Prior LUE fistula was never used, per patient.   Procedure: US guided access right upper extremity dialysis circuit. Angiogram.  Plasty of outflow stenosis at the anastamosis  Findings: flowing brachial to brachial graft. No inflow stenosis.  Stenosis on the venous outflow, near the anastamosis.  Plasty to 39mm.    US shows dense fluid collection associated with the arterial anastamosis.  No flow on duplex.  No opacification on the reflux images.  Possible hematoma or thrombosed pseudoaneurysm  Complications: None Recommendations:  - Ok to use graft.  Remove the stitch after 24 hours at next session.  - Do not submerge  - Routine care   Signed,  Dulcy Fanny. Earleen Newport, DO

## 2018-02-05 NOTE — Sedation Documentation (Signed)
Patient is resting

## 2018-02-05 NOTE — Progress Notes (Signed)
Patient went for walk with girlfriend and came back to room smelling heavily of smoke.When questioned patient if he had been smoking .Patient did admit to smoking a cigar.Educated patient not to smoke while in hospital.Patient verbalized understanding.

## 2018-02-05 NOTE — Progress Notes (Addendum)
   Subjective: Very upset today regarding hospital stay. Talked for 25+ minutes uninterrupted with pressured speech. Exhibiting symptoms of distractibility, easy frustration, flight of ideas, and erratic behavior. I question an undiagnosed manic bipolar disorder.   Objective:  Vital signs in last 24 hours: Vitals:   02/04/18 0604 02/04/18 1340 02/04/18 2100 02/05/18 0636  BP: (!) 152/88 (!) 134/92 135/85 136/78  Pulse: 84 81 95 89  Resp:      Temp: 98.6 F (37 C) 98.5 F (36.9 C) 98.2 F (36.8 C) 98.1 F (36.7 C)  TempSrc: Oral Oral Oral Oral  SpO2: 98% 100% 94% 100%  Weight:       Physical Exam Constitutional: NAD, appears comfortable Pulmonary/Chest: Speaking in full sentences, breathing comfortably on RA.   Skin: No rash Neurological: A&Ox3, CN II - XII grossly intact. Moving all extremities spontaneously  Psychiatric: Irritable, talkative, pressured speech, & flight of ideas  Assessment/Plan:  [name] is a 37yo male with PMH of type 1 diabetes, ESRD on HD MWF, HTN, and diabetic foot ulcer/osteomyelitis of right lower extremity who presents for management of osteomyelitis of right 5th metatarsal head, now s/p right 5th metatarsal amputation 5/8. Started on empiric IV vanc/zosyn 5/9, transitioned to IV vanc & PO metronidazole per ID recs. Surgical culture growing rare Staph aureus & possible anaerobe.   ESRD on HD MWF Clotted RUE AVG - Nephrology consulted; appreciate their assistance  - Plan for thrombolysis of AVG today with possible angioplasty / stent per IR, then HD. Possible discharge?  - Continue renvela and sensipar  ? Undiagnosed Bipolar / Psychiatric Disorder Exhibiting multiple symptoms of mania. Talked uninterrupted today for 25+ minutes with very pressured speech. He is very easily upset & irritable. Ideally would benefit from psychiatric evaluation however it is probably best to limit the number of doctors involved in his care at this time. He exhibits poor  understanding of the medical system and is very frustrated with the number of doctors already involved. Ideally he should follow up with psychiatry as an outpatient. First step would be to establish with a PCP.   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 Pain managed with PO meds.  - Podiatry & ID consulted; appreciate their assistance - Discontinue IV dilaudid 1mg  q12h PRN today - Continue Oxy IR 5mg  q4h PRN  - BCx 5/7 remain negative - F/u intraoperative Cx 5/8 -> Rare staph aureus & possible anaerobe  - Continue IV Vanc & PO Metronidazole  - PT/OT - F/u with outpt podiatry on 5/16  Type 1 DM Most recent A1c in 09/2017 was 6.2. Patient's home medications listed as Lantus 20u QHS and Novolog SSI. Patient reported he was only taking 6 units of lantus at home. CBGs continue to be elevated despite increase in lantus yesterday.  - Increase Lantus to 12u --> 16u QHS - SSI-M TID WC - CBG monitoring  Hx of HTN BP 152/88. Home regimen includes amlodipine 10mg  daily and losartan 50mg  daily - Holding home losartan, can restart if BP remains elevated - Continue home amlodipine 10mg  daily  Anemia Likely anemia of chronic renal disease with Hb near baseline. - Monitor   Chronic nocturnal fecal incontinence Does not have diarrhea, bloody stools, or abdominal pain. - Outpatient f/u  Dispo: Anticipated discharge pending IR thrombolysis of RUE dialysis graft & hemodialysis.   Shane Ochs, MD 02/05/2018, 7:29 AM Pager: [phone]

## 2018-02-05 NOTE — Consult Note (Signed)
Chief Complaint: Patient was seen in consultation today for right arm dialysis access thrombolysis Chief Complaint  Patient presents with  . Foot Pain   at the request of Dr Carolynne Edouard   Supervising Physician: Corrie Mckusick  Patient Status: [facility] - In-pt  History of Present Illness: [name] is a 37 y.o. male   Rt upper arm dialysis graft placed 11/2016 Has never had issue with this graft Attempted dialysis yesterday in hospital-- clotted  Request made for thrombolysis Recent Rt toe amputation 02/02/18  Dr Laurence Ferrari reviewed chart and approves procedure   Past Medical History:  Diagnosis Date  . Diabetes mellitus without complication ([place])   . Diabetic gastroparesis ([place])   . Dialysis patient ([place])   . Hypertension   . Renal disorder    Dialysis  . Sepsis [facility])     Past Surgical History:  Procedure Laterality Date  . AMPUTATION Right 02/02/2018   Procedure: RIGHT FIFTH TOE AND METATARSAL AMPUTATION. Filetted toe flap metatarsal resection. Debridement Plantar Foot wound;  Surgeon: Evelina Bucy, DPM;  Location: [place];  Service: Podiatry;  Laterality: Right;  . APPLICATION OF WOUND VAC  02/02/2018   Procedure: APPLICATION OF WOUND VAC  Right Foot;  Surgeon: Evelina Bucy, DPM;  Location: [place];  Service: Podiatry;;  . AV FISTULA PLACEMENT     left arm.  . AV FISTULA PLACEMENT Right 12/22/2016   Procedure: INSERTION OF ARTERIOVENOUS (AV) GORE-TEX GRAFT ARM;  Surgeon: Elam Dutch, MD;  Location: [place];  Service: Vascular;  Laterality: Right;  . EYE SURGERY    . I&D EXTREMITY Right 10/31/2017   Procedure: IRRIGATION AND DEBRIDEMENT RIGHT FOOT;  Surgeon: Evelina Bucy, DPM;  Location: [place];  Service: Podiatry;  Laterality: Right;  . INSERTION OF DIALYSIS CATHETER     Right subclavian  . UPPER EXTREMITY VENOGRAPHY N/A 11/16/2016   Procedure: Upper Extremity Venography - Right Central;  Surgeon: Elam Dutch, MD;  Location: [place] CV LAB;   Service: Cardiovascular;  Laterality: N/A;    Allergies: No known allergies  Medications: Prior to Admission medications   Medication Sig Start Date End Date Taking? Authorizing Provider  acetaminophen (TYLENOL) 325 MG tablet Take 2 tablets (650 mg total) by mouth every 6 (six) hours as needed for mild pain or headache (pain/headache). Can restart this after you are done with Oxycodone/Tylenol 11/10/17  Yes Domenic Polite, MD  amLODipine (NORVASC) 10 MG tablet Take 10 mg by mouth daily.   Yes [provider]  cinacalcet (SENSIPAR) 30 MG tablet Take 30 mg by mouth daily with supper.   Yes [provider]  ciprofloxacin (CIPRO) 250 MG tablet Take 1 tablet (250 mg total) by mouth 2 (two) times daily. 01/27/18  Yes Evelina Bucy, DPM  clindamycin (CLEOCIN) 300 MG capsule Take 1 capsule (300 mg total) by mouth 2 (two) times daily. 01/27/18  Yes Evelina Bucy, DPM  clonazePAM (KLONOPIN) 0.5 MG tablet Take 0.5 mg by mouth daily as needed for anxiety (sleep).  07/21/17  Yes [provider]  collagenase (SANTYL) ointment Apply 1 application topically daily. 11/24/17  Yes Evelina Bucy, DPM  insulin aspart (NOVOLOG) 100 UNIT/ML injection Inject 5 Units into the skin 2 (two) times daily with a meal.    Yes [provider]  losartan (COZAAR) 50 MG tablet Take 50 mg by mouth daily. 09/27/17  Yes [provider]  ondansetron (ZOFRAN ODT) 4 MG disintegrating tablet Take 1 tablet (  4 mg total) by mouth every 8 (eight) hours as needed for nausea or vomiting. 11/23/17  Yes Ward, Ozella Almond, PA-C  sevelamer carbonate (RENVELA) 800 MG tablet Take 2,400-3,200 mg by mouth See admin instructions. Take 3 - 4 tablets (2400 mg -3200 mg) by mouth two times daily with meals   Yes [provider]  insulin glargine (LANTUS) 100 UNIT/ML injection Inject 0.1 mLs (10 Units total) into the skin at bedtime. 02/04/18   Colbert Ewing, MD  metroNIDAZOLE (FLAGYL) 500 MG  tablet Take 1 tablet (500 mg total) by mouth 3 (three) times daily for 14 days. 02/04/18 02/18/18  Colbert Ewing, MD  oxyCODONE (OXY IR/ROXICODONE) 5 MG immediate release tablet Take 1 tablet (5 mg total) by mouth every 6 (six) hours as needed for up to 7 days for moderate pain or severe pain. 02/04/18 02/11/18  Colbert Ewing, MD  Vancomycin [facility]) 750-5 MG/150ML-% SOLN Inject 150 mLs (750 mg total) into the vein every Monday, Wednesday, and Friday with hemodialysis for 14 days. 02/04/18 02/18/18  Colbert Ewing, MD     Family History  Problem Relation Age of Onset  . Diabetes Mellitus II Unknown     Social History   Socioeconomic History  . Marital status: Single    Spouse name: Not on file  . Number of children: Not on file  . Years of education: Not on file  . Highest education level: Not on file  Occupational History  . Not on file  Social Needs  . Financial resource strain: Not on file  . Food insecurity:    Worry: Not on file    Inability: Not on file  . Transportation needs:    Medical: Not on file    Non-medical: Not on file  Tobacco Use  . Smoking status: Never Smoker  . Smokeless tobacco: Never Used  Substance and Sexual Activity  . Alcohol use: No  . Drug use: No  . Sexual activity: Never  Lifestyle  . Physical activity:    Days per week: Not on file    Minutes per session: Not on file  . Stress: Not on file  Relationships  . Social connections:    Talks on phone: Not on file    Gets together: Not on file    Attends religious service: Not on file    Active member of club or organization: Not on file    Attends meetings of clubs or organizations: Not on file    Relationship status: Not on file  Other Topics Concern  . Not on file  Social History Narrative  . Not on file     Review of Systems: A 12 point ROS discussed and pertinent positives are indicated in the HPI above.  All other systems are negative.  Review of Systems  Constitutional:  Positive for activity change and fatigue. Negative for appetite change, fever and unexpected weight change.  Respiratory: Negative for cough and shortness of breath.   Cardiovascular: Negative for chest pain.  Gastrointestinal: Negative for abdominal pain.  Psychiatric/Behavioral: Negative for behavioral problems and confusion.    Vital Signs: BP 136/78 (BP Location: Left Arm)   Pulse 89   Temp 98.1 F (36.7 C) (Oral)   Resp 20   Wt 162 lb 0.6 oz (73.5 kg)   SpO2 100%   BMI 26.15 kg/m   Physical Exam  Constitutional: He is oriented to person, place, and time.  Cardiovascular: Normal rate and regular rhythm.  Pulmonary/Chest: Effort normal and breath  sounds normal.  Abdominal: Soft. Bowel sounds are normal.  Musculoskeletal: Normal range of motion.  Rt 5th toe recent amputation   Neurological: He is alert and oriented to person, place, and time.  Skin: Skin is warm and dry.  Psychiatric: He has a normal mood and affect. His behavior is normal. Judgment and thought content normal.  Nursing note and vitals reviewed.   Imaging: Mr Foot Right Wo Contrast  Result Date: 02/02/2018 CLINICAL DATA:  Diabetic foot ulcer along the lateral aspect the foot. Preoperative evaluation prior to surgery. EXAM: MRI OF THE RIGHT FOREFOOT WITHOUT CONTRAST TECHNIQUE: Multiplanar, multisequence MR imaging of the right forefoot was performed. No intravenous contrast was administered. COMPARISON:  None. FINDINGS: Bones/Joint/Cartilage Soft tissue ulcer overlying the fifth MTP joint. Bone destruction of fifth metatarsal head with severe marrow edema extending into the shaft. Bone destruction of the base of the fifth proximal phalanx with severe marrow edema throughout the fifth proximal phalanx. Mild reactive marrow edema in the fifth middle phalanx. Mild marrow edema in the fourth metatarsal head without cortical destruction likely reflecting reactive edema secondary to adjacent inflammation versus less  likely osteomyelitis. Mild marrow edema in the medial and lateral hallux sesamoids as can be seen with mild sesamoiditis. No other marrow signal abnormality. No acute fracture. Mild osteoarthritis of the first TMT joint. Mild osteoarthritis of the talonavicular joint. Ligaments Lisfranc ligament is intact.  Collateral ligaments are intact. Muscles and Tendons Mild T2 hyperintense signal throughout the plantar musculature likely neurogenic. Flexor, peroneal and extensor compartment tendons are intact. Soft tissues No other soft tissue abnormality.  No fluid collection or hematoma. IMPRESSION: 1. Soft tissue ulcer overlying the fifth MTP joint. Osteomyelitis of the fifth metatarsal head and fifth proximal phalanx with severe marrow edema. 2. Mild reactive marrow edema without bone destruction in the fifth middle phalanx. Mild marrow edema in the fourth metatarsal head without cortical destruction likely reflecting reactive edema secondary to adjacent inflammation versus less likely osteomyelitis. Electronically Signed   By: Kathreen Devoid   On: 02/02/2018 08:05   Dg Foot Complete Right  Result Date: 02/02/2018 CLINICAL DATA:  Recent right foot surgery. EXAM: RIGHT FOOT COMPLETE - 3+ VIEW COMPARISON:  01/31/2018 FINDINGS: Amputation of the fifth toe and partial amputation of the fifth metatarsal bone. The proximal fifth metatarsal bone is still present. There is a focal lucency along the plantar and lateral aspect of the proximal fourth metatarsal bone which probably represents postsurgical changes. Extensive atherosclerotic calcifications. Surgical staples along the lateral aspect of the foot along with a wound VAC. IMPRESSION: Postoperative changes compatible with fifth toe amputation and partial amputation of the fifth metatarsal bone. Lucency involving the proximal fourth metatarsal bone. This is new from the previous examination and compatible with postoperative changes. Electronically Signed   By: Markus Daft  M.D.   On: 02/02/2018 19:10   Dg Foot Complete Right  Result Date: 01/31/2018 CLINICAL DATA:  37 year old male with right foot pain and edema. EXAM: RIGHT FOOT COMPLETE - 3+ VIEW COMPARISON:  Right foot radiograph dated 01/13/2018 FINDINGS: There is a mildly displaced fracture of the distal fifth metatarsal with erosive changes of the bone. There is also a mildly displaced fracture of the base of the proximal phalanx of the fifth toe with associated bone erosion. The head of the fifth metatarsal is displaced laterally and there is dislocation at the fifth MTP joint. Findings suspicious for pathologic fracture secondary to underlying infection/osteomyelitis. Clinical correlation is recommended. MRI may provide better  evaluation. No other acute fracture or dislocation identified. The bones are somewhat osteopenic for age. There is diffuse subcutaneous edema and soft tissue thickening primarily over the dorsum of the foot and forefoot. Vascular calcifications noted. IMPRESSION: Displaced fractures of the distal fifth metatarsal and base of the proximal phalanx of the fifth digit likely secondary to underlying osteomyelitis. There is associated dislocation at the fifth MTP joint. MRI may provide better evaluation if clinically indicated. Electronically Signed   By: Anner Crete M.D.   On: 01/31/2018 23:16   Dg Foot Complete Right  Result Date: 01/13/2018 Please see detailed radiograph report in office note.   Labs:  CBC: Recent Labs    01/31/18 2248 02/02/18 0731 02/04/18 1557 02/05/18 0618  WBC 12.9* 10.0 9.9 11.6*  HGB 9.9* 9.6* 8.1* 9.4*  HCT 31.4* 31.0* 25.4* 29.6*  PLT 295 261 231 266    COAGS: Recent Labs    10/25/17 1800  INR 1.16    BMP: Recent Labs    11/22/17 1942 01/31/18 2248 02/02/18 0731 02/04/18 1558  NA 135 136 132* 130*  K 5.2* 4.3 4.9 4.9  CL 92* 91* 93* 92*  CO2 20* 32 27 26  GLUCOSE 228* 68 146* 346*  BUN 31* 23* 43* 41*  CALCIUM 9.1 9.1 8.7* 9.0    CREATININE 7.96* 6.26* 8.99* 9.76*  GFRNONAA 8* 10* 7* 6*  GFRAA 9* 12* 8* 7*    LIVER FUNCTION TESTS: Recent Labs    10/25/17 1800  11/22/17 1942 01/31/18 2248 02/02/18 0731 02/04/18 1558  BILITOT 2.0*  --  1.2 0.5  --   --   AST 18  --  17 15  --   --   ALT 10*  --  <5* 10*  --   --   ALKPHOS 114  --  99 120  --   --   PROT 7.1  --  7.9 8.0  --   --   ALBUMIN 3.4*   < > 3.3* 3.0* 2.4* 2.4*   < > = values in this interval not displayed.    TUMOR MARKERS: No results for input(s): AFPTM, CEA, CA199, CHROMGRNA in the last 8760 hours.  Assessment and Plan:  ESRD Rt upper arm dialysis graft 11/2016 Attempted dialysis yesterday-- clotted Rt 5th toe amputation 02/02/18 Scheduled for thrombolysis with possible angioplasty/stent. Possible tunneled dialysis catheter placement Risks and benefits discussed with the patient including, but not limited to bleeding, infection, vascular injury, pulmonary embolism, need for tunneled HD catheter placement or even death.  All of the patient's questions were answered, patient is agreeable to proceed. Consent signed and in chart.   Thank you for this interesting consult.  I greatly enjoyed meeting AEDEN MATRANGA and look forward to participating in their care.  A copy of this report was sent to the requesting provider on this date.  Electronically Signed: Lavonia Drafts, PA-C 02/05/2018, 8:16 AM   I spent a total of 20 Minutes    in face to face in clinical consultation, greater than 50% of which was counseling/coordinating care for right arm dialysis graft declot

## 2018-02-05 NOTE — Sedation Documentation (Signed)
Vital signs stable. 

## 2018-02-05 NOTE — Progress Notes (Addendum)
Subjective:   Very un appropriately frustrated about his malfunctioning dialysis AVGG  And not able to be discharged yest. /"I almost left AMA"/ note for IR shuntogram today then HD . Appears  Somewhat manic but after ~~ 20 min conversation =deciding to stay   I think his questions were answered.   Objective Vital signs in last 24 hours: Vitals:   02/04/18 0604 02/04/18 1340 02/04/18 2100 02/05/18 0636  BP: (!) 152/88 (!) 134/92 135/85 136/78  Pulse: 84 81 95 89  Resp:      Temp: 98.6 F (37 C) 98.5 F (36.9 C) 98.2 F (36.8 C) 98.1 F (36.7 C)  TempSrc: Oral Oral Oral Oral  SpO2: 98% 100% 94% 100%  Weight:       Weight change:   Physical Exam: General: alert, anxious,  But NAD , OX4 Heart: RRR, no mrg Lungs: CTA Abdomen: Soft , NT, ND Extremities: L foot clean dry bandage/ no pedal; edema   Dialysis Access: pos now Weak bruit  RUA AVG   Dialysis Orders:[facility] MWF 4 hrs 180 NRe 400/800 73.5 kg 2.0 K/ 2.0 Ca  -Heparin 9000 units IV TIW -Mircera 75 mcg IV q 2 weeks (New dosing-hasn't rec'd yet.)  -Calcitriol 1.75 mcg PO TIW   Problem/Plan: 1. Malfunctioning Right upper arm AVGG- still has weak bruit  But not accessible at hd yesterday after 2 different cannulation attempts   including RN head of dept .= IR to eval   2. Osteomyelitis R Foot.S/pR 5th toe and metatarsal amputation5/8/19. Has rec'd Vanc/Cefepime. Per primary/Dr. March Rummage.For wound vac but unable to get adequate seal./ Dr Erenest Rasher noted needs IV antib  Op [facility]/ for  Discharge today afrer HD if able per admit team  . 3. ESRD - MWF HD schedule./ hd today then Monday  4. Hypertension/volume -Hypertensive at admit . UF  tolerating well. Losartan 50 mg on OP med list however on amlodipine 10 mg PO here. (amlodipine not on OP med list).at edw last hd  Fu post wt today /?loweredw at dc . bp now controlled  5. Anemia - HGB 9.4 given Aranesp 40 mcg IV with HD wed 5/08/ continue same esa =69mc g Mircera next  As op  5/15.  6. Metabolic bone disease - Ca 9.0/corec Ca 10.2. Phos 5..1 Continue ,Renvela  senispar, VDRA. 7. Nutrition -renal diet 8. DM type 1: per primary   [name], PA-C [place] [phone] 02/05/2018,10:34 AM  LOS: 4 days   Labs: Basic Metabolic Panel: Recent Labs  Lab 01/31/18 2248 02/02/18 0731 02/04/18 1558  NA 136 132* 130*  K 4.3 4.9 4.9  CL 91* 93* 92*  CO2 32 27 26  GLUCOSE 68 146* 346*  BUN 23* 43* 41*  CREATININE 6.26* 8.99* 9.76*  CALCIUM 9.1 8.7* 9.0  PHOS  --  4.7* 5.1*   Liver Function Tests: Recent Labs  Lab 01/31/18 2248 02/02/18 0731 02/04/18 1558  AST 15  --   --   ALT 10*  --   --   ALKPHOS 120  --   --   BILITOT 0.5  --   --   PROT 8.0  --   --   ALBUMIN 3.0* 2.4* 2.4*   No results for input(s): LIPASE, AMYLASE in the last 168 hours. No results for input(s): AMMONIA in the last 168 hours. CBC: Recent Labs  Lab 01/31/18 2248 02/02/18 0731 02/04/18 1557 02/05/18 0618  WBC 12.9* 10.0 9.9 11.6*  NEUTROABS 9.8*  --   --   --  HGB 9.9* 9.6* 8.1* 9.4*  HCT 31.4* 31.0* 25.4* 29.6*  MCV 91.3 89.6 87.9 88.4  PLT 295 261 231 266   Cardiac Enzymes: No results for input(s): CKTOTAL, CKMB, CKMBINDEX, TROPONINI in the last 168 hours. CBG: Recent Labs  Lab 02/04/18 0811 02/04/18 1239 02/04/18 1739 02/04/18 2213 02/05/18 0746  GLUCAP 229* 237* 329* 233* 185*    Studies/Results: No results found. Medications: . sodium chloride    . sodium chloride    . sodium chloride 10 mL/hr at 02/02/18 1851  . vancomycin     . alteplase      . amLODipine  10 mg Oral Daily  . calcitRIOL  1.75 mcg Oral Daily  . cinacalcet  30 mg Oral Q supper  . darbepoetin (ARANESP) injection - DIALYSIS  40 mcg Intravenous Q Wed-HD  . heparin      . heparin  9,000 Units Dialysis Once in dialysis  . insulin aspart  0-15 Units Subcutaneous TID WC  . insulin aspart  0-5 Units Subcutaneous QHS  . insulin glargine  16 Units  Subcutaneous QHS  . iopamidol      . lidocaine      . metroNIDAZOLE  500 mg Oral Q8H  . sevelamer carbonate  4,000 mg Oral TID WC  . sodium chloride flush  3 mL Intravenous Q12H   I have seen and examined this patient and agree with plan and assessment in the above note with renal recommendations/intervention highlighted.  Pt seen on HD s/p PTA of venous limb of access.  No issues with cannulation or dialysis.  Hopeful discharge after HD.  Shane John A Aveon Colquhoun,MD 02/05/2018 2:06 PM

## 2018-02-05 NOTE — Sedation Documentation (Signed)
Pt had left wrist 22g IV infiltrated. IV removed

## 2018-02-06 LAB — CULTURE, BLOOD (ROUTINE X 2)
Culture: NO GROWTH
Culture: NO GROWTH
Special Requests: ADEQUATE
Special Requests: ADEQUATE

## 2018-02-06 NOTE — Progress Notes (Signed)
Discharge instructions given to pt. Pt's friend and pt's mom came to pick him up. Pt left the floor per wheelchair with NT.

## 2018-02-07 DIAGNOSIS — E1022 Type 1 diabetes mellitus with diabetic chronic kidney disease: Secondary | ICD-10-CM | POA: Diagnosis not present

## 2018-02-07 DIAGNOSIS — D631 Anemia in chronic kidney disease: Secondary | ICD-10-CM | POA: Diagnosis not present

## 2018-02-07 DIAGNOSIS — N2581 Secondary hyperparathyroidism of renal origin: Secondary | ICD-10-CM | POA: Diagnosis not present

## 2018-02-07 DIAGNOSIS — M869 Osteomyelitis, unspecified: Secondary | ICD-10-CM | POA: Diagnosis not present

## 2018-02-07 DIAGNOSIS — Z4802 Encounter for removal of sutures: Secondary | ICD-10-CM | POA: Insufficient documentation

## 2018-02-07 DIAGNOSIS — D509 Iron deficiency anemia, unspecified: Secondary | ICD-10-CM | POA: Diagnosis not present

## 2018-02-07 DIAGNOSIS — N186 End stage renal disease: Secondary | ICD-10-CM | POA: Diagnosis not present

## 2018-02-07 LAB — GLUCOSE, CAPILLARY: Glucose-Capillary: 109 mg/dL — ABNORMAL HIGH (ref 65–99)

## 2018-02-09 DIAGNOSIS — D509 Iron deficiency anemia, unspecified: Secondary | ICD-10-CM | POA: Diagnosis not present

## 2018-02-09 DIAGNOSIS — N2581 Secondary hyperparathyroidism of renal origin: Secondary | ICD-10-CM | POA: Diagnosis not present

## 2018-02-09 DIAGNOSIS — E1022 Type 1 diabetes mellitus with diabetic chronic kidney disease: Secondary | ICD-10-CM | POA: Diagnosis not present

## 2018-02-09 DIAGNOSIS — N186 End stage renal disease: Secondary | ICD-10-CM | POA: Diagnosis not present

## 2018-02-09 DIAGNOSIS — M869 Osteomyelitis, unspecified: Secondary | ICD-10-CM | POA: Diagnosis not present

## 2018-02-09 DIAGNOSIS — D631 Anemia in chronic kidney disease: Secondary | ICD-10-CM | POA: Diagnosis not present

## 2018-02-09 LAB — AEROBIC/ANAEROBIC CULTURE (SURGICAL/DEEP WOUND)

## 2018-02-09 LAB — AEROBIC/ANAEROBIC CULTURE W GRAM STAIN (SURGICAL/DEEP WOUND)

## 2018-02-10 ENCOUNTER — Ambulatory Visit (INDEPENDENT_AMBULATORY_CARE_PROVIDER_SITE_OTHER): Payer: Medicare Other | Admitting: Podiatry

## 2018-02-10 DIAGNOSIS — L97411 Non-pressure chronic ulcer of right heel and midfoot limited to breakdown of skin: Secondary | ICD-10-CM

## 2018-02-10 DIAGNOSIS — E08621 Diabetes mellitus due to underlying condition with foot ulcer: Secondary | ICD-10-CM | POA: Diagnosis not present

## 2018-02-10 LAB — AEROBIC/ANAEROBIC CULTURE (SURGICAL/DEEP WOUND)

## 2018-02-10 LAB — AEROBIC/ANAEROBIC CULTURE W GRAM STAIN (SURGICAL/DEEP WOUND)

## 2018-02-11 DIAGNOSIS — N2581 Secondary hyperparathyroidism of renal origin: Secondary | ICD-10-CM | POA: Diagnosis not present

## 2018-02-11 DIAGNOSIS — M869 Osteomyelitis, unspecified: Secondary | ICD-10-CM | POA: Diagnosis not present

## 2018-02-11 DIAGNOSIS — E1022 Type 1 diabetes mellitus with diabetic chronic kidney disease: Secondary | ICD-10-CM | POA: Diagnosis not present

## 2018-02-11 DIAGNOSIS — D509 Iron deficiency anemia, unspecified: Secondary | ICD-10-CM | POA: Diagnosis not present

## 2018-02-11 DIAGNOSIS — D631 Anemia in chronic kidney disease: Secondary | ICD-10-CM | POA: Diagnosis not present

## 2018-02-11 DIAGNOSIS — N186 End stage renal disease: Secondary | ICD-10-CM | POA: Diagnosis not present

## 2018-02-14 DIAGNOSIS — D631 Anemia in chronic kidney disease: Secondary | ICD-10-CM | POA: Diagnosis not present

## 2018-02-14 DIAGNOSIS — E46 Unspecified protein-calorie malnutrition: Secondary | ICD-10-CM | POA: Insufficient documentation

## 2018-02-14 DIAGNOSIS — M869 Osteomyelitis, unspecified: Secondary | ICD-10-CM | POA: Diagnosis not present

## 2018-02-14 DIAGNOSIS — N2581 Secondary hyperparathyroidism of renal origin: Secondary | ICD-10-CM | POA: Diagnosis not present

## 2018-02-14 DIAGNOSIS — N186 End stage renal disease: Secondary | ICD-10-CM | POA: Diagnosis not present

## 2018-02-14 DIAGNOSIS — E1022 Type 1 diabetes mellitus with diabetic chronic kidney disease: Secondary | ICD-10-CM | POA: Diagnosis not present

## 2018-02-14 DIAGNOSIS — D509 Iron deficiency anemia, unspecified: Secondary | ICD-10-CM | POA: Diagnosis not present

## 2018-02-16 DIAGNOSIS — N186 End stage renal disease: Secondary | ICD-10-CM | POA: Diagnosis not present

## 2018-02-16 DIAGNOSIS — D509 Iron deficiency anemia, unspecified: Secondary | ICD-10-CM | POA: Diagnosis not present

## 2018-02-16 DIAGNOSIS — N2581 Secondary hyperparathyroidism of renal origin: Secondary | ICD-10-CM | POA: Diagnosis not present

## 2018-02-16 DIAGNOSIS — M869 Osteomyelitis, unspecified: Secondary | ICD-10-CM | POA: Diagnosis not present

## 2018-02-16 DIAGNOSIS — D631 Anemia in chronic kidney disease: Secondary | ICD-10-CM | POA: Diagnosis not present

## 2018-02-16 DIAGNOSIS — E1022 Type 1 diabetes mellitus with diabetic chronic kidney disease: Secondary | ICD-10-CM | POA: Diagnosis not present

## 2018-02-17 ENCOUNTER — Ambulatory Visit: Payer: Medicare Other | Admitting: Podiatry

## 2018-02-18 ENCOUNTER — Telehealth: Payer: Self-pay | Admitting: Podiatry

## 2018-02-18 DIAGNOSIS — D509 Iron deficiency anemia, unspecified: Secondary | ICD-10-CM | POA: Diagnosis not present

## 2018-02-18 DIAGNOSIS — E1022 Type 1 diabetes mellitus with diabetic chronic kidney disease: Secondary | ICD-10-CM | POA: Diagnosis not present

## 2018-02-18 DIAGNOSIS — M869 Osteomyelitis, unspecified: Secondary | ICD-10-CM | POA: Diagnosis not present

## 2018-02-18 DIAGNOSIS — N186 End stage renal disease: Secondary | ICD-10-CM | POA: Diagnosis not present

## 2018-02-18 DIAGNOSIS — D631 Anemia in chronic kidney disease: Secondary | ICD-10-CM | POA: Diagnosis not present

## 2018-02-18 DIAGNOSIS — N2581 Secondary hyperparathyroidism of renal origin: Secondary | ICD-10-CM | POA: Diagnosis not present

## 2018-02-18 NOTE — Telephone Encounter (Signed)
Charm Barges, called he said something came up, so he missed his appt. Last week. He will see you next week.

## 2018-02-21 DIAGNOSIS — E1022 Type 1 diabetes mellitus with diabetic chronic kidney disease: Secondary | ICD-10-CM | POA: Diagnosis not present

## 2018-02-21 DIAGNOSIS — N2581 Secondary hyperparathyroidism of renal origin: Secondary | ICD-10-CM | POA: Diagnosis not present

## 2018-02-21 DIAGNOSIS — D631 Anemia in chronic kidney disease: Secondary | ICD-10-CM | POA: Diagnosis not present

## 2018-02-21 DIAGNOSIS — N186 End stage renal disease: Secondary | ICD-10-CM | POA: Diagnosis not present

## 2018-02-21 DIAGNOSIS — D509 Iron deficiency anemia, unspecified: Secondary | ICD-10-CM | POA: Diagnosis not present

## 2018-02-21 DIAGNOSIS — M869 Osteomyelitis, unspecified: Secondary | ICD-10-CM | POA: Diagnosis not present

## 2018-02-23 DIAGNOSIS — N186 End stage renal disease: Secondary | ICD-10-CM | POA: Diagnosis not present

## 2018-02-23 DIAGNOSIS — E1022 Type 1 diabetes mellitus with diabetic chronic kidney disease: Secondary | ICD-10-CM | POA: Diagnosis not present

## 2018-02-23 DIAGNOSIS — M869 Osteomyelitis, unspecified: Secondary | ICD-10-CM | POA: Diagnosis not present

## 2018-02-23 DIAGNOSIS — D631 Anemia in chronic kidney disease: Secondary | ICD-10-CM | POA: Diagnosis not present

## 2018-02-23 DIAGNOSIS — N2581 Secondary hyperparathyroidism of renal origin: Secondary | ICD-10-CM | POA: Diagnosis not present

## 2018-02-23 DIAGNOSIS — D509 Iron deficiency anemia, unspecified: Secondary | ICD-10-CM | POA: Diagnosis not present

## 2018-02-24 ENCOUNTER — Ambulatory Visit (INDEPENDENT_AMBULATORY_CARE_PROVIDER_SITE_OTHER): Payer: Medicare Other | Admitting: Podiatry

## 2018-02-24 ENCOUNTER — Telehealth: Payer: Self-pay | Admitting: *Deleted

## 2018-02-24 DIAGNOSIS — E08621 Diabetes mellitus due to underlying condition with foot ulcer: Secondary | ICD-10-CM

## 2018-02-24 DIAGNOSIS — L97411 Non-pressure chronic ulcer of right heel and midfoot limited to breakdown of skin: Secondary | ICD-10-CM | POA: Diagnosis not present

## 2018-02-24 NOTE — Progress Notes (Signed)
  Subjective:  Patient ID: [name], male    DOB: [birth year],  MRN: [id number]  Chief Complaint  Patient presents with  . Routine Post Op    right 5th toe amputation   . Wound Check    diabetic ulcer right foot   DOS: 02/02/18 Procedure: Partial fifth ray amputation right foot with filleted toe flap, debridement irrigation right foot wound  37 y.o. male returns for wound care. Believes the wound to be doing well.  Missed his appointment last week and something came up. Denies N/V/F/Ch.  Objective:  There were no vitals filed for this visit.   General AA&O x3. Normal mood and affect.  Vascular Foot warm to touch.  Neurologic Sensation grossly diminished.  Dermatologic (Wound) Wound Location: Right first second metatarsals plantarly Wound Measurement: 1.5x5 Wound Base: Granular/Healthy Peri-wound: Calloused Exudate: None: wound tissue dry  Wound progress: Improved since last check.   Skin incision lateral aspect of right foot well coapted with intact suture and staple except for small central dehiscence without probe to bone  Orthopedic: No pain to palpation either foot.   Assessment & Plan:  Patient was evaluated and treated and all questions answered.  Ulcer right first second metatarsal  -Debridement as below. -Dressed with santyl, DSD.  Procedure: Excisional Debridement of Wound Rationale: Removal of non-viable soft tissue from the wound to promote healing.  Anesthesia: none Pre-Debridement Wound Measurements: Overlying hyperkeratosis Post-Debridement Wound Measurements: 1 cm x 5 cm x 0.2 cm  Type of Debridement: Excisional Tissue Removed: Non-viable soft tissue Depth of Debridement: subq Instrumentation: 312 blade Technique: Sharp excisional debridement to bleeding, viable wound base.  Dressing: Dry, sterile, compression dressing. Disposition: Patient tolerated procedure well. Patient to return in 1 week for follow-up.  Status post partial fifth ray  amputation right foot with filleted toe flap -Healing well except for small central dehiscence. -Santyl wet-to-dry dressing applied to open area.  Expect delayed healing from this location.  Will use sutures and staples in for about 2 more weeks  Return in about 1 week (around 03/03/2018) for Post-op, Wound Care.

## 2018-02-24 NOTE — Telephone Encounter (Addendum)
Required form, clinicals received 02/25/2018 and demographics faxed with orders to apply santyl to 1, 2nd metatarsal areas and open 5th metatarsal incision then wet-to-dry dressing weekly.

## 2018-02-25 ENCOUNTER — Telehealth: Payer: Self-pay | Admitting: Podiatry

## 2018-02-25 DIAGNOSIS — N186 End stage renal disease: Secondary | ICD-10-CM | POA: Diagnosis not present

## 2018-02-25 DIAGNOSIS — N2581 Secondary hyperparathyroidism of renal origin: Secondary | ICD-10-CM | POA: Diagnosis not present

## 2018-02-25 DIAGNOSIS — E1022 Type 1 diabetes mellitus with diabetic chronic kidney disease: Secondary | ICD-10-CM | POA: Diagnosis not present

## 2018-02-25 DIAGNOSIS — M869 Osteomyelitis, unspecified: Secondary | ICD-10-CM | POA: Diagnosis not present

## 2018-02-25 DIAGNOSIS — D509 Iron deficiency anemia, unspecified: Secondary | ICD-10-CM | POA: Diagnosis not present

## 2018-02-25 DIAGNOSIS — D631 Anemia in chronic kidney disease: Secondary | ICD-10-CM | POA: Diagnosis not present

## 2018-02-25 NOTE — Telephone Encounter (Signed)
This is [name], RN with [facility]. I'm calling about orders we received on [name]. With Medicare we have to have very specific orders. I'm calling to get clarification on his wound care orders. Also, I need to verify the fax number for Dr. March Rummage so our clinicians can contact Dr. March Rummage if needed. If you could please call me back at [phone].

## 2018-02-25 NOTE — Progress Notes (Signed)
  Subjective:  Patient ID: [name], male    DOB: [birth year],  MRN: [id number]  Chief Complaint  Patient presents with  . Routine Post Op    right 5th toe amputation    DOS: 02/02/18 Procedure: Partial fifth ray amputation right foot with filleted toe flap, debridement irrigation right foot wound  37 y.o. male returns for postop blood and continued wound care.  States he is doing well.  Denies nausea vomiting fever chills denies new issues.  Objective:  There were no vitals filed for this visit. General AA&O x3. Normal mood and affect.  Vascular Foot warm to touch.  Neurologic Sensation grossly diminished.  Dermatologic (Wound) Wound Location: Right first second metatarsals plantarly Wound Measurement: 1.5x5 Wound Base: Granular/Healthy Peri-wound: Calloused Exudate: None: wound tissue dry  Wound progress: Improved since last check.   Skin incision lateral aspect of right foot well coapted with intact suture and staple  Orthopedic: No pain to palpation either foot.   Assessment & Plan:  Patient was evaluated and treated and all questions answered.  Status post partial fifth ray amputation right foot with filleted toe flap -Healing well sutures and staples intact.  Redressed.  Ulcer right first second metatarsals -Debridement as below. -Dressed with medihoney, DSD.  Procedure: Excisional Debridement of Wound Rationale: Removal of non-viable soft tissue from the wound to promote healing.  Anesthesia: none Pre-Debridement Wound Measurements: 1 cm x 5 cm x 0.2 cm  Post-Debridement Wound Measurements: 1.5 cm x 5 cm x 0.2 cm  Type of Debridement: Excisional Tissue Removed: Non-viable soft tissue Depth of Debridement: subq Instrumentation: 312 blade  Technique: Sharp excisional debridement to bleeding, viable wound base.  Dressing: Dry, sterile, compression dressing. Disposition: Patient tolerated procedure well. Patient to return in 1 week for follow-up.  Return in  about 1 week (around 02/17/2018).

## 2018-02-25 NOTE — Telephone Encounter (Signed)
I informed Melissa Hairston - [facility] pt was to have santyl to right 1, 2 nd metatarsal and right 5th ray metatarsal open amputation wound and cover wet-to-dry dressing weekly.

## 2018-02-26 DIAGNOSIS — N186 End stage renal disease: Secondary | ICD-10-CM | POA: Diagnosis not present

## 2018-02-26 DIAGNOSIS — Z992 Dependence on renal dialysis: Secondary | ICD-10-CM | POA: Diagnosis not present

## 2018-02-26 DIAGNOSIS — I129 Hypertensive chronic kidney disease with stage 1 through stage 4 chronic kidney disease, or unspecified chronic kidney disease: Secondary | ICD-10-CM | POA: Diagnosis not present

## 2018-02-28 DIAGNOSIS — Z992 Dependence on renal dialysis: Secondary | ICD-10-CM | POA: Diagnosis not present

## 2018-02-28 DIAGNOSIS — N2581 Secondary hyperparathyroidism of renal origin: Secondary | ICD-10-CM | POA: Diagnosis not present

## 2018-02-28 DIAGNOSIS — M869 Osteomyelitis, unspecified: Secondary | ICD-10-CM | POA: Diagnosis not present

## 2018-02-28 DIAGNOSIS — D631 Anemia in chronic kidney disease: Secondary | ICD-10-CM | POA: Diagnosis not present

## 2018-02-28 DIAGNOSIS — T82868A Thrombosis of vascular prosthetic devices, implants and grafts, initial encounter: Secondary | ICD-10-CM | POA: Diagnosis not present

## 2018-02-28 DIAGNOSIS — D509 Iron deficiency anemia, unspecified: Secondary | ICD-10-CM | POA: Diagnosis not present

## 2018-02-28 DIAGNOSIS — I871 Compression of vein: Secondary | ICD-10-CM | POA: Diagnosis not present

## 2018-02-28 DIAGNOSIS — E1022 Type 1 diabetes mellitus with diabetic chronic kidney disease: Secondary | ICD-10-CM | POA: Diagnosis not present

## 2018-02-28 DIAGNOSIS — N186 End stage renal disease: Secondary | ICD-10-CM | POA: Diagnosis not present

## 2018-03-01 ENCOUNTER — Telehealth: Payer: Self-pay | Admitting: Podiatry

## 2018-03-01 NOTE — Telephone Encounter (Signed)
This is Varney Biles, Therapist, sports with [facility]. I was calling to let Dr. March Rummage know that we are having difficulty locating the pt has he is not returning any of our calls. We would be able to go out and see him on 06 June to start services. You can reach me at [phone]. Thank you.

## 2018-03-01 NOTE — Telephone Encounter (Signed)
"  The wireless customer you are calling is not available, please try again later", without availability to leave a message.

## 2018-03-02 DIAGNOSIS — N2581 Secondary hyperparathyroidism of renal origin: Secondary | ICD-10-CM | POA: Diagnosis not present

## 2018-03-02 DIAGNOSIS — D509 Iron deficiency anemia, unspecified: Secondary | ICD-10-CM | POA: Diagnosis not present

## 2018-03-02 DIAGNOSIS — M869 Osteomyelitis, unspecified: Secondary | ICD-10-CM | POA: Diagnosis not present

## 2018-03-02 DIAGNOSIS — N186 End stage renal disease: Secondary | ICD-10-CM | POA: Diagnosis not present

## 2018-03-02 DIAGNOSIS — D631 Anemia in chronic kidney disease: Secondary | ICD-10-CM | POA: Diagnosis not present

## 2018-03-02 DIAGNOSIS — E1022 Type 1 diabetes mellitus with diabetic chronic kidney disease: Secondary | ICD-10-CM | POA: Diagnosis not present

## 2018-03-02 NOTE — Telephone Encounter (Signed)
I spoke with Munjor at [facility] and she states when their nurse went to the pt's home he was not there. I told [place], pt requires promptness and will not wait past the appointed time. [place] states she will try again.

## 2018-03-02 NOTE — Telephone Encounter (Addendum)
I spoke with pt and he said they the [place] made a call to come out to get his wound care set up but did not show up and he has been doing his own wound care. I offered to give pt the [facility] office phone pt refused states he wasn't going to put his foot forward, if they were going to do his wound care they could call him. Pt states he has too much pride. I told him I would contact [facility] and have them call him. I reminded pt of his appt on 03/03/2018 at 11:15am.

## 2018-03-03 ENCOUNTER — Ambulatory Visit (INDEPENDENT_AMBULATORY_CARE_PROVIDER_SITE_OTHER): Payer: Medicare Other | Admitting: Podiatry

## 2018-03-03 DIAGNOSIS — L97411 Non-pressure chronic ulcer of right heel and midfoot limited to breakdown of skin: Secondary | ICD-10-CM

## 2018-03-03 DIAGNOSIS — E08621 Diabetes mellitus due to underlying condition with foot ulcer: Secondary | ICD-10-CM

## 2018-03-03 NOTE — Telephone Encounter (Signed)
[name] - [facility] states she sees it looks like their LPN had just take verbal orders. [name] states they still have the orders from the 02/25/2018 and they are still current.

## 2018-03-03 NOTE — Progress Notes (Signed)
  Subjective:  Patient ID: [name], male    DOB: October 20, 1980,  MRN: [id number]  Chief Complaint  Patient presents with  . 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 male returns for wound care.  Does not present with his surgical shoe today despite being given one.  States the wound is doing okay has not heard from home health care because he did not call them back however he states that there is supposed to come on Friday Objective:  There were no vitals filed for this visit.   General AA&O x3. Normal mood and affect.  Vascular Foot warm to touch.  Neurologic Sensation grossly diminished.  Dermatologic (Wound) Wound Location: Right first second metatarsals plantarly Wound Measurement: 1.0x5 Wound Base: Granular/Healthy Peri-wound: Calloused Exudate: None: wound tissue dry  Wound progress: Improved since last check.   Skin incision lateral aspect of right foot well coapted with intact suture and staple except for small central dehiscence without probe to bone.  Dehiscence fibrotic in nature  Orthopedic: No pain to palpation either foot.   Assessment & Plan:  Patient was evaluated and treated and all questions answered.  Ulcer right first second metatarsal  -Debridement as below. -Dressed with santyl, DSD. -Home health care for Santyl to both wound areas  Procedure: Excisional Debridement of Wound Rationale: Removal of non-viable soft tissue from the wound to promote healing.  Anesthesia: none Pre-Debridement Wound Measurements: 0.5 cm x 0.5 cm x 0.2 cm  Post-Debridement Wound Measurements: 1 cm x 0.5 cm x 0.2 cm  Type of Debridement: Excisional Tissue Removed: Non-viable soft tissue Depth of Debridement: subq Instrumentation: 312 blade Technique: Sharp excisional debridement to  bleeding, viable wound base.  Dressing: Dry, sterile, compression dressing. Disposition: Patient tolerated procedure well. Patient to return in 1 week for follow-up.  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  Current alcohol.  Discussed that he needs to wear surgical shoe to offload the pressure.  Advised of risk of worsening should he not wear the shoe.  While there is no concern return for exposed bone today worried that that should the wound progress he would likely have further tissue loss and need for return to the operating room  No follow-ups on file.

## 2018-03-04 ENCOUNTER — Telehealth: Payer: Self-pay | Admitting: Podiatry

## 2018-03-04 DIAGNOSIS — Z89421 Acquired absence of other right toe(s): Secondary | ICD-10-CM | POA: Diagnosis not present

## 2018-03-04 DIAGNOSIS — L97411 Non-pressure chronic ulcer of right heel and midfoot limited to breakdown of skin: Secondary | ICD-10-CM | POA: Diagnosis not present

## 2018-03-04 DIAGNOSIS — N2581 Secondary hyperparathyroidism of renal origin: Secondary | ICD-10-CM | POA: Diagnosis not present

## 2018-03-04 DIAGNOSIS — N186 End stage renal disease: Secondary | ICD-10-CM | POA: Diagnosis not present

## 2018-03-04 DIAGNOSIS — E10621 Type 1 diabetes mellitus with foot ulcer: Secondary | ICD-10-CM | POA: Diagnosis not present

## 2018-03-04 DIAGNOSIS — E1022 Type 1 diabetes mellitus with diabetic chronic kidney disease: Secondary | ICD-10-CM | POA: Diagnosis not present

## 2018-03-04 DIAGNOSIS — I120 Hypertensive chronic kidney disease with stage 5 chronic kidney disease or end stage renal disease: Secondary | ICD-10-CM | POA: Diagnosis not present

## 2018-03-04 DIAGNOSIS — M869 Osteomyelitis, unspecified: Secondary | ICD-10-CM | POA: Diagnosis not present

## 2018-03-04 DIAGNOSIS — T8781 Dehiscence of amputation stump: Secondary | ICD-10-CM | POA: Diagnosis not present

## 2018-03-04 DIAGNOSIS — Z9181 History of falling: Secondary | ICD-10-CM | POA: Diagnosis not present

## 2018-03-04 DIAGNOSIS — D509 Iron deficiency anemia, unspecified: Secondary | ICD-10-CM | POA: Diagnosis not present

## 2018-03-04 DIAGNOSIS — D631 Anemia in chronic kidney disease: Secondary | ICD-10-CM | POA: Diagnosis not present

## 2018-03-04 NOTE — Telephone Encounter (Signed)
This is Pam, RN with [facility]. I'm calling concerning [facility]. We would like to follow this guy for foot care to his right foot. My number is [phone] that  Would be Team 3 so that you can speak to a Freight forwarder. The fax number is [phone].

## 2018-03-07 DIAGNOSIS — N2581 Secondary hyperparathyroidism of renal origin: Secondary | ICD-10-CM | POA: Diagnosis not present

## 2018-03-07 DIAGNOSIS — E1022 Type 1 diabetes mellitus with diabetic chronic kidney disease: Secondary | ICD-10-CM | POA: Diagnosis not present

## 2018-03-07 DIAGNOSIS — D631 Anemia in chronic kidney disease: Secondary | ICD-10-CM | POA: Diagnosis not present

## 2018-03-07 DIAGNOSIS — D509 Iron deficiency anemia, unspecified: Secondary | ICD-10-CM | POA: Diagnosis not present

## 2018-03-07 DIAGNOSIS — N186 End stage renal disease: Secondary | ICD-10-CM | POA: Diagnosis not present

## 2018-03-07 DIAGNOSIS — M869 Osteomyelitis, unspecified: Secondary | ICD-10-CM | POA: Diagnosis not present

## 2018-03-07 NOTE — Telephone Encounter (Signed)
I spoke with Team 3 Manager and she states she has the orders and will take my call as a confirmation verbal order.

## 2018-03-08 DIAGNOSIS — L97411 Non-pressure chronic ulcer of right heel and midfoot limited to breakdown of skin: Secondary | ICD-10-CM | POA: Diagnosis not present

## 2018-03-08 DIAGNOSIS — T8781 Dehiscence of amputation stump: Secondary | ICD-10-CM | POA: Diagnosis not present

## 2018-03-08 DIAGNOSIS — N186 End stage renal disease: Secondary | ICD-10-CM | POA: Diagnosis not present

## 2018-03-08 DIAGNOSIS — E1022 Type 1 diabetes mellitus with diabetic chronic kidney disease: Secondary | ICD-10-CM | POA: Diagnosis not present

## 2018-03-08 DIAGNOSIS — I120 Hypertensive chronic kidney disease with stage 5 chronic kidney disease or end stage renal disease: Secondary | ICD-10-CM | POA: Diagnosis not present

## 2018-03-08 DIAGNOSIS — E10621 Type 1 diabetes mellitus with foot ulcer: Secondary | ICD-10-CM | POA: Diagnosis not present

## 2018-03-09 DIAGNOSIS — M869 Osteomyelitis, unspecified: Secondary | ICD-10-CM | POA: Diagnosis not present

## 2018-03-09 DIAGNOSIS — E1022 Type 1 diabetes mellitus with diabetic chronic kidney disease: Secondary | ICD-10-CM | POA: Diagnosis not present

## 2018-03-09 DIAGNOSIS — D631 Anemia in chronic kidney disease: Secondary | ICD-10-CM | POA: Diagnosis not present

## 2018-03-09 DIAGNOSIS — N2581 Secondary hyperparathyroidism of renal origin: Secondary | ICD-10-CM | POA: Diagnosis not present

## 2018-03-09 DIAGNOSIS — N186 End stage renal disease: Secondary | ICD-10-CM | POA: Diagnosis not present

## 2018-03-09 DIAGNOSIS — D509 Iron deficiency anemia, unspecified: Secondary | ICD-10-CM | POA: Diagnosis not present

## 2018-03-10 ENCOUNTER — Telehealth: Payer: Self-pay | Admitting: *Deleted

## 2018-03-10 ENCOUNTER — Ambulatory Visit (INDEPENDENT_AMBULATORY_CARE_PROVIDER_SITE_OTHER): Payer: Medicare Other | Admitting: Podiatry

## 2018-03-10 DIAGNOSIS — E08621 Diabetes mellitus due to underlying condition with foot ulcer: Secondary | ICD-10-CM

## 2018-03-10 DIAGNOSIS — L97411 Non-pressure chronic ulcer of right heel and midfoot limited to breakdown of skin: Secondary | ICD-10-CM

## 2018-03-10 NOTE — Telephone Encounter (Signed)
Dr. March Rummage states he would like pt to continue Vancomycin 750mg  3 times a week at dialysis for 2 more weeks. Orders called to [place]. Jolene states she will call Dr. Detterding and find out if that is okay with him.

## 2018-03-10 NOTE — Telephone Encounter (Signed)
[facility] states pt is not on any antibiotics since received last dose of Vancomycin on 02/18/2018.

## 2018-03-11 DIAGNOSIS — L97411 Non-pressure chronic ulcer of right heel and midfoot limited to breakdown of skin: Secondary | ICD-10-CM | POA: Diagnosis not present

## 2018-03-11 DIAGNOSIS — E1022 Type 1 diabetes mellitus with diabetic chronic kidney disease: Secondary | ICD-10-CM | POA: Diagnosis not present

## 2018-03-11 DIAGNOSIS — N2581 Secondary hyperparathyroidism of renal origin: Secondary | ICD-10-CM | POA: Diagnosis not present

## 2018-03-11 DIAGNOSIS — E10621 Type 1 diabetes mellitus with foot ulcer: Secondary | ICD-10-CM | POA: Diagnosis not present

## 2018-03-11 DIAGNOSIS — I120 Hypertensive chronic kidney disease with stage 5 chronic kidney disease or end stage renal disease: Secondary | ICD-10-CM | POA: Diagnosis not present

## 2018-03-11 DIAGNOSIS — M869 Osteomyelitis, unspecified: Secondary | ICD-10-CM | POA: Diagnosis not present

## 2018-03-11 DIAGNOSIS — D631 Anemia in chronic kidney disease: Secondary | ICD-10-CM | POA: Diagnosis not present

## 2018-03-11 DIAGNOSIS — T8781 Dehiscence of amputation stump: Secondary | ICD-10-CM | POA: Diagnosis not present

## 2018-03-11 DIAGNOSIS — N186 End stage renal disease: Secondary | ICD-10-CM | POA: Diagnosis not present

## 2018-03-11 DIAGNOSIS — D509 Iron deficiency anemia, unspecified: Secondary | ICD-10-CM | POA: Diagnosis not present

## 2018-03-14 DIAGNOSIS — M869 Osteomyelitis, unspecified: Secondary | ICD-10-CM | POA: Diagnosis not present

## 2018-03-14 DIAGNOSIS — L97411 Non-pressure chronic ulcer of right heel and midfoot limited to breakdown of skin: Secondary | ICD-10-CM | POA: Diagnosis not present

## 2018-03-14 DIAGNOSIS — I120 Hypertensive chronic kidney disease with stage 5 chronic kidney disease or end stage renal disease: Secondary | ICD-10-CM | POA: Diagnosis not present

## 2018-03-14 DIAGNOSIS — E10621 Type 1 diabetes mellitus with foot ulcer: Secondary | ICD-10-CM | POA: Diagnosis not present

## 2018-03-14 DIAGNOSIS — N2581 Secondary hyperparathyroidism of renal origin: Secondary | ICD-10-CM | POA: Diagnosis not present

## 2018-03-14 DIAGNOSIS — T8781 Dehiscence of amputation stump: Secondary | ICD-10-CM | POA: Diagnosis not present

## 2018-03-14 DIAGNOSIS — D509 Iron deficiency anemia, unspecified: Secondary | ICD-10-CM | POA: Diagnosis not present

## 2018-03-14 DIAGNOSIS — N186 End stage renal disease: Secondary | ICD-10-CM | POA: Diagnosis not present

## 2018-03-14 DIAGNOSIS — D631 Anemia in chronic kidney disease: Secondary | ICD-10-CM | POA: Diagnosis not present

## 2018-03-14 DIAGNOSIS — E1022 Type 1 diabetes mellitus with diabetic chronic kidney disease: Secondary | ICD-10-CM | POA: Diagnosis not present

## 2018-03-16 DIAGNOSIS — N2581 Secondary hyperparathyroidism of renal origin: Secondary | ICD-10-CM | POA: Diagnosis not present

## 2018-03-16 DIAGNOSIS — D509 Iron deficiency anemia, unspecified: Secondary | ICD-10-CM | POA: Diagnosis not present

## 2018-03-16 DIAGNOSIS — D631 Anemia in chronic kidney disease: Secondary | ICD-10-CM | POA: Diagnosis not present

## 2018-03-16 DIAGNOSIS — M869 Osteomyelitis, unspecified: Secondary | ICD-10-CM | POA: Diagnosis not present

## 2018-03-16 DIAGNOSIS — E1022 Type 1 diabetes mellitus with diabetic chronic kidney disease: Secondary | ICD-10-CM | POA: Diagnosis not present

## 2018-03-16 DIAGNOSIS — N186 End stage renal disease: Secondary | ICD-10-CM | POA: Diagnosis not present

## 2018-03-17 ENCOUNTER — Ambulatory Visit (INDEPENDENT_AMBULATORY_CARE_PROVIDER_SITE_OTHER): Payer: Medicare Other | Admitting: Podiatry

## 2018-03-17 DIAGNOSIS — L97411 Non-pressure chronic ulcer of right heel and midfoot limited to breakdown of skin: Secondary | ICD-10-CM | POA: Diagnosis not present

## 2018-03-17 DIAGNOSIS — E08621 Diabetes mellitus due to underlying condition with foot ulcer: Secondary | ICD-10-CM | POA: Diagnosis not present

## 2018-03-18 DIAGNOSIS — D631 Anemia in chronic kidney disease: Secondary | ICD-10-CM | POA: Diagnosis not present

## 2018-03-18 DIAGNOSIS — E10621 Type 1 diabetes mellitus with foot ulcer: Secondary | ICD-10-CM | POA: Diagnosis not present

## 2018-03-18 DIAGNOSIS — N186 End stage renal disease: Secondary | ICD-10-CM | POA: Diagnosis not present

## 2018-03-18 DIAGNOSIS — E1022 Type 1 diabetes mellitus with diabetic chronic kidney disease: Secondary | ICD-10-CM | POA: Diagnosis not present

## 2018-03-18 DIAGNOSIS — L97411 Non-pressure chronic ulcer of right heel and midfoot limited to breakdown of skin: Secondary | ICD-10-CM | POA: Diagnosis not present

## 2018-03-18 DIAGNOSIS — M869 Osteomyelitis, unspecified: Secondary | ICD-10-CM | POA: Diagnosis not present

## 2018-03-18 DIAGNOSIS — D509 Iron deficiency anemia, unspecified: Secondary | ICD-10-CM | POA: Diagnosis not present

## 2018-03-18 DIAGNOSIS — I120 Hypertensive chronic kidney disease with stage 5 chronic kidney disease or end stage renal disease: Secondary | ICD-10-CM | POA: Diagnosis not present

## 2018-03-18 DIAGNOSIS — T8781 Dehiscence of amputation stump: Secondary | ICD-10-CM | POA: Diagnosis not present

## 2018-03-18 DIAGNOSIS — N2581 Secondary hyperparathyroidism of renal origin: Secondary | ICD-10-CM | POA: Diagnosis not present

## 2018-03-20 NOTE — Progress Notes (Signed)
  Subjective:  Patient ID: [name], male    DOB: 04/03/1981,  MRN: [id number]  Chief Complaint  Patient presents with  . 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 male returns for wound care.  States that the right foot looks better.  Denies new issues. Objective:  There were no vitals filed for this visit.   General AA&O x3. Normal mood and affect.  Vascular Foot warm to touch.  Neurologic Sensation grossly diminished.  Dermatologic (Wound) Wound Location: Right first second metatarsals plantarly Wound Measurement: 0.5x0.5 post-debridement. Wound Base: Granular/Healthy Peri-wound: Calloused Exudate: None: wound tissue dry  Wound progress: Improved since last check.   Central dehiscence with hyperkeratosis no probe to bone.  Orthopedic: No pain to palpation either foot.   Assessment & Plan:  Patient was evaluated and treated and all questions answered.  Ulcer right first second metatarsal  -Debridement as below. -Dressed with santyl, DSD. -Continue Santyl to both wound areas with HHC  Procedure: Excisional Debridement of Wound Rationale: Removal of non-viable soft tissue from the wound to promote healing.  Anesthesia: none Pre-Debridement Wound Measurements:  hyperkeratosis Post-Debridement Wound Measurements: 0.5 cm x 0.5 cm x 0.1 cm  Type of Debridement: Excisional Tissue Removed: Non-viable soft tissue Depth of Debridement: subq Instrumentation: 312 blade, tissue nipper Technique: Sharp excisional debridement to bleeding, viable wound base.  Dressing: Dry, sterile, compression dressing. Disposition: Patient tolerated procedure well. Patient to return in 1 week for follow-up.  Status post partial fifth ray amputation right  foot with filleted toe flap -Dehiscence debrided.  Covered under global  Return in about 1 week (around 03/24/2018).

## 2018-03-21 DIAGNOSIS — N2581 Secondary hyperparathyroidism of renal origin: Secondary | ICD-10-CM | POA: Diagnosis not present

## 2018-03-21 DIAGNOSIS — E1022 Type 1 diabetes mellitus with diabetic chronic kidney disease: Secondary | ICD-10-CM | POA: Diagnosis not present

## 2018-03-21 DIAGNOSIS — L97411 Non-pressure chronic ulcer of right heel and midfoot limited to breakdown of skin: Secondary | ICD-10-CM | POA: Diagnosis not present

## 2018-03-21 DIAGNOSIS — N186 End stage renal disease: Secondary | ICD-10-CM | POA: Diagnosis not present

## 2018-03-21 DIAGNOSIS — T8781 Dehiscence of amputation stump: Secondary | ICD-10-CM | POA: Diagnosis not present

## 2018-03-21 DIAGNOSIS — D509 Iron deficiency anemia, unspecified: Secondary | ICD-10-CM | POA: Diagnosis not present

## 2018-03-21 DIAGNOSIS — M869 Osteomyelitis, unspecified: Secondary | ICD-10-CM | POA: Diagnosis not present

## 2018-03-21 DIAGNOSIS — D631 Anemia in chronic kidney disease: Secondary | ICD-10-CM | POA: Diagnosis not present

## 2018-03-21 DIAGNOSIS — I120 Hypertensive chronic kidney disease with stage 5 chronic kidney disease or end stage renal disease: Secondary | ICD-10-CM | POA: Diagnosis not present

## 2018-03-21 DIAGNOSIS — E10621 Type 1 diabetes mellitus with foot ulcer: Secondary | ICD-10-CM | POA: Diagnosis not present

## 2018-03-23 DIAGNOSIS — N186 End stage renal disease: Secondary | ICD-10-CM | POA: Diagnosis not present

## 2018-03-23 DIAGNOSIS — M869 Osteomyelitis, unspecified: Secondary | ICD-10-CM | POA: Diagnosis not present

## 2018-03-23 DIAGNOSIS — D509 Iron deficiency anemia, unspecified: Secondary | ICD-10-CM | POA: Diagnosis not present

## 2018-03-23 DIAGNOSIS — D631 Anemia in chronic kidney disease: Secondary | ICD-10-CM | POA: Diagnosis not present

## 2018-03-23 DIAGNOSIS — E1022 Type 1 diabetes mellitus with diabetic chronic kidney disease: Secondary | ICD-10-CM | POA: Diagnosis not present

## 2018-03-23 DIAGNOSIS — N2581 Secondary hyperparathyroidism of renal origin: Secondary | ICD-10-CM | POA: Diagnosis not present

## 2018-03-24 ENCOUNTER — Ambulatory Visit (INDEPENDENT_AMBULATORY_CARE_PROVIDER_SITE_OTHER): Payer: Medicare Other | Admitting: Podiatry

## 2018-03-24 DIAGNOSIS — L97411 Non-pressure chronic ulcer of right heel and midfoot limited to breakdown of skin: Secondary | ICD-10-CM

## 2018-03-24 DIAGNOSIS — E08621 Diabetes mellitus due to underlying condition with foot ulcer: Secondary | ICD-10-CM

## 2018-03-25 DIAGNOSIS — D631 Anemia in chronic kidney disease: Secondary | ICD-10-CM | POA: Diagnosis not present

## 2018-03-25 DIAGNOSIS — D509 Iron deficiency anemia, unspecified: Secondary | ICD-10-CM | POA: Diagnosis not present

## 2018-03-25 DIAGNOSIS — N2581 Secondary hyperparathyroidism of renal origin: Secondary | ICD-10-CM | POA: Diagnosis not present

## 2018-03-25 DIAGNOSIS — N186 End stage renal disease: Secondary | ICD-10-CM | POA: Diagnosis not present

## 2018-03-25 DIAGNOSIS — M869 Osteomyelitis, unspecified: Secondary | ICD-10-CM | POA: Diagnosis not present

## 2018-03-25 DIAGNOSIS — L97411 Non-pressure chronic ulcer of right heel and midfoot limited to breakdown of skin: Secondary | ICD-10-CM | POA: Diagnosis not present

## 2018-03-25 DIAGNOSIS — I120 Hypertensive chronic kidney disease with stage 5 chronic kidney disease or end stage renal disease: Secondary | ICD-10-CM | POA: Diagnosis not present

## 2018-03-25 DIAGNOSIS — E1022 Type 1 diabetes mellitus with diabetic chronic kidney disease: Secondary | ICD-10-CM | POA: Diagnosis not present

## 2018-03-25 DIAGNOSIS — E10621 Type 1 diabetes mellitus with foot ulcer: Secondary | ICD-10-CM | POA: Diagnosis not present

## 2018-03-25 DIAGNOSIS — T8781 Dehiscence of amputation stump: Secondary | ICD-10-CM | POA: Diagnosis not present

## 2018-03-27 NOTE — Progress Notes (Signed)
  Subjective:  Patient ID: [name], male    DOB: Aug 12, 1981,  MRN: [id number]  Chief Complaint  Patient presents with  . 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 male returns for wound care.  States the right foot is doing much better.  Denies new complaints. Objective:  There were no vitals filed for this visit.   General AA&O x3. Normal mood and affect.  Vascular Foot warm to touch.  Neurologic Sensation grossly diminished.  Dermatologic (Wound) Wound Location: Right first second metatarsals plantarly Wound Measurement: 0.3x0.3 post-debridement. Wound Base: Granular/Healthy Peri-wound: Calloused Exudate: None: wound tissue dry  Wound progress: Improved since last check.   Central dehiscence with hyperkeratosis no probe to bone.  Orthopedic: No pain to palpation either foot.   Assessment & Plan:  Patient was evaluated and treated and all questions answered.  Ulcer right first second metatarsal  -Debridement as below. -Dressed with silvadene, DSD. -Continue Santyl to both wound areas with HHC  Procedure: Excisional Debridement of Wound Rationale: Removal of non-viable soft tissue from the wound to promote healing.  Anesthesia: none Pre-Debridement Wound Measurements: overlying hyperkeratosis  Post-Debridement Wound Measurements: 0.3 cm x 0.3 cm x 0.1 cm  Type of Debridement: Excisional Tissue Removed: Non-viable soft tissue Depth of Debridement: subq Instrumentation: 312 blade Technique: Sharp excisional debridement to bleeding, viable wound base.  Dressing: Dry, sterile, compression dressing. Disposition: Patient tolerated procedure well. Patient to return in 1 week for follow-up.  Status post partial fifth ray amputation right foot with filleted toe flap -Dehiscence debrided.  Covered under global  Return in  about 1 week (around 03/31/2018) for Wound Care.

## 2018-03-27 NOTE — Progress Notes (Signed)
  Subjective:  Patient ID: [name], male    DOB: December 22, 1980,  MRN: [id number]  Chief Complaint  Patient presents with  . 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 male returns for wound care.  Wearing his surgical shoe this visit. No new complaints. Objective:  There were no vitals filed for this visit.   General AA&O x3. Normal mood and affect.  Vascular Foot warm to touch.  Neurologic Sensation grossly diminished.  Dermatologic (Wound) Wound Location: Right first second metatarsals plantarly Wound Measurement: 0.5x5 Wound Base: Granular/Healthy Peri-wound: Calloused Exudate: None: wound tissue dry   Wound progress: Improved since last check.  Skin incision lateral aspect of right foot well coapted with intact suture and staple except for small central dehiscence. No probe to bone.  Orthopedic: No pain to palpation either foot.   Assessment & Plan:  Patient was evaluated and treated and all questions answered.  Ulcer right first second metatarsal  -Debridement as below. -Dressed with santyl, DSD. -Home health care for Santyl to both wound areas  Procedure: Excisional Debridement of Wound Rationale: Removal of non-viable soft tissue from the wound to promote healing.  Anesthesia: none Pre-Debridement Wound Measurements:overlying hyperkeratosis  Post-Debridement Wound Measurements: 0.5 cm x 0.5 cm x 0.2 cm  Type of Debridement: Excisional Tissue Removed: Non-viable soft tissue Depth of Debridement: subq Instrumentation: 312 blade Technique: Sharp excisional debridement to bleeding, viable wound base.  Dressing: Dry, sterile, compression dressing. Disposition: Patient tolerated procedure well. Patient to return in 1 week for follow-up.  Status post partial fifth ray amputation right foot with filleted toe flap -Central dehiscence  thoroughly debrided.  Covered under global.  No follow-ups on file.

## 2018-03-28 DIAGNOSIS — I129 Hypertensive chronic kidney disease with stage 1 through stage 4 chronic kidney disease, or unspecified chronic kidney disease: Secondary | ICD-10-CM | POA: Diagnosis not present

## 2018-03-28 DIAGNOSIS — E1022 Type 1 diabetes mellitus with diabetic chronic kidney disease: Secondary | ICD-10-CM | POA: Diagnosis not present

## 2018-03-28 DIAGNOSIS — R739 Hyperglycemia, unspecified: Secondary | ICD-10-CM | POA: Diagnosis not present

## 2018-03-28 DIAGNOSIS — N186 End stage renal disease: Secondary | ICD-10-CM | POA: Diagnosis not present

## 2018-03-28 DIAGNOSIS — Z992 Dependence on renal dialysis: Secondary | ICD-10-CM | POA: Diagnosis not present

## 2018-03-28 DIAGNOSIS — N2581 Secondary hyperparathyroidism of renal origin: Secondary | ICD-10-CM | POA: Diagnosis not present

## 2018-03-28 DIAGNOSIS — L97411 Non-pressure chronic ulcer of right heel and midfoot limited to breakdown of skin: Secondary | ICD-10-CM | POA: Diagnosis not present

## 2018-03-28 DIAGNOSIS — T8781 Dehiscence of amputation stump: Secondary | ICD-10-CM | POA: Diagnosis not present

## 2018-03-28 DIAGNOSIS — I120 Hypertensive chronic kidney disease with stage 5 chronic kidney disease or end stage renal disease: Secondary | ICD-10-CM | POA: Diagnosis not present

## 2018-03-28 DIAGNOSIS — E10621 Type 1 diabetes mellitus with foot ulcer: Secondary | ICD-10-CM | POA: Diagnosis not present

## 2018-03-28 DIAGNOSIS — D509 Iron deficiency anemia, unspecified: Secondary | ICD-10-CM | POA: Diagnosis not present

## 2018-03-30 ENCOUNTER — Ambulatory Visit: Payer: Medicare Other | Admitting: Podiatry

## 2018-03-30 DIAGNOSIS — D509 Iron deficiency anemia, unspecified: Secondary | ICD-10-CM | POA: Diagnosis not present

## 2018-03-30 DIAGNOSIS — N2581 Secondary hyperparathyroidism of renal origin: Secondary | ICD-10-CM | POA: Diagnosis not present

## 2018-03-30 DIAGNOSIS — R739 Hyperglycemia, unspecified: Secondary | ICD-10-CM | POA: Diagnosis not present

## 2018-03-30 DIAGNOSIS — N186 End stage renal disease: Secondary | ICD-10-CM | POA: Diagnosis not present

## 2018-03-30 DIAGNOSIS — E1022 Type 1 diabetes mellitus with diabetic chronic kidney disease: Secondary | ICD-10-CM | POA: Diagnosis not present

## 2018-04-01 DIAGNOSIS — N186 End stage renal disease: Secondary | ICD-10-CM | POA: Diagnosis not present

## 2018-04-01 DIAGNOSIS — E10621 Type 1 diabetes mellitus with foot ulcer: Secondary | ICD-10-CM | POA: Diagnosis not present

## 2018-04-01 DIAGNOSIS — D509 Iron deficiency anemia, unspecified: Secondary | ICD-10-CM | POA: Diagnosis not present

## 2018-04-01 DIAGNOSIS — R739 Hyperglycemia, unspecified: Secondary | ICD-10-CM | POA: Diagnosis not present

## 2018-04-01 DIAGNOSIS — L97411 Non-pressure chronic ulcer of right heel and midfoot limited to breakdown of skin: Secondary | ICD-10-CM | POA: Diagnosis not present

## 2018-04-01 DIAGNOSIS — E1022 Type 1 diabetes mellitus with diabetic chronic kidney disease: Secondary | ICD-10-CM | POA: Diagnosis not present

## 2018-04-01 DIAGNOSIS — T8781 Dehiscence of amputation stump: Secondary | ICD-10-CM | POA: Diagnosis not present

## 2018-04-01 DIAGNOSIS — I120 Hypertensive chronic kidney disease with stage 5 chronic kidney disease or end stage renal disease: Secondary | ICD-10-CM | POA: Diagnosis not present

## 2018-04-01 DIAGNOSIS — N2581 Secondary hyperparathyroidism of renal origin: Secondary | ICD-10-CM | POA: Diagnosis not present

## 2018-04-04 DIAGNOSIS — T8781 Dehiscence of amputation stump: Secondary | ICD-10-CM | POA: Diagnosis not present

## 2018-04-04 DIAGNOSIS — N186 End stage renal disease: Secondary | ICD-10-CM | POA: Diagnosis not present

## 2018-04-04 DIAGNOSIS — I120 Hypertensive chronic kidney disease with stage 5 chronic kidney disease or end stage renal disease: Secondary | ICD-10-CM | POA: Diagnosis not present

## 2018-04-04 DIAGNOSIS — E1022 Type 1 diabetes mellitus with diabetic chronic kidney disease: Secondary | ICD-10-CM | POA: Diagnosis not present

## 2018-04-04 DIAGNOSIS — R739 Hyperglycemia, unspecified: Secondary | ICD-10-CM | POA: Diagnosis not present

## 2018-04-04 DIAGNOSIS — E10621 Type 1 diabetes mellitus with foot ulcer: Secondary | ICD-10-CM | POA: Diagnosis not present

## 2018-04-04 DIAGNOSIS — N2581 Secondary hyperparathyroidism of renal origin: Secondary | ICD-10-CM | POA: Diagnosis not present

## 2018-04-04 DIAGNOSIS — D509 Iron deficiency anemia, unspecified: Secondary | ICD-10-CM | POA: Diagnosis not present

## 2018-04-04 DIAGNOSIS — L97411 Non-pressure chronic ulcer of right heel and midfoot limited to breakdown of skin: Secondary | ICD-10-CM | POA: Diagnosis not present

## 2018-04-06 DIAGNOSIS — N186 End stage renal disease: Secondary | ICD-10-CM | POA: Diagnosis not present

## 2018-04-06 DIAGNOSIS — D509 Iron deficiency anemia, unspecified: Secondary | ICD-10-CM | POA: Diagnosis not present

## 2018-04-06 DIAGNOSIS — R739 Hyperglycemia, unspecified: Secondary | ICD-10-CM | POA: Diagnosis not present

## 2018-04-06 DIAGNOSIS — E1022 Type 1 diabetes mellitus with diabetic chronic kidney disease: Secondary | ICD-10-CM | POA: Diagnosis not present

## 2018-04-06 DIAGNOSIS — E1129 Type 2 diabetes mellitus with other diabetic kidney complication: Secondary | ICD-10-CM | POA: Diagnosis not present

## 2018-04-06 DIAGNOSIS — N2581 Secondary hyperparathyroidism of renal origin: Secondary | ICD-10-CM | POA: Diagnosis not present

## 2018-04-07 ENCOUNTER — Ambulatory Visit (INDEPENDENT_AMBULATORY_CARE_PROVIDER_SITE_OTHER): Payer: Medicare Other | Admitting: Podiatry

## 2018-04-07 DIAGNOSIS — E08621 Diabetes mellitus due to underlying condition with foot ulcer: Secondary | ICD-10-CM

## 2018-04-07 DIAGNOSIS — L97411 Non-pressure chronic ulcer of right heel and midfoot limited to breakdown of skin: Secondary | ICD-10-CM

## 2018-04-08 DIAGNOSIS — N2581 Secondary hyperparathyroidism of renal origin: Secondary | ICD-10-CM | POA: Diagnosis not present

## 2018-04-08 DIAGNOSIS — R739 Hyperglycemia, unspecified: Secondary | ICD-10-CM | POA: Diagnosis not present

## 2018-04-08 DIAGNOSIS — E1022 Type 1 diabetes mellitus with diabetic chronic kidney disease: Secondary | ICD-10-CM | POA: Diagnosis not present

## 2018-04-08 DIAGNOSIS — D509 Iron deficiency anemia, unspecified: Secondary | ICD-10-CM | POA: Diagnosis not present

## 2018-04-08 DIAGNOSIS — N186 End stage renal disease: Secondary | ICD-10-CM | POA: Diagnosis not present

## 2018-04-11 DIAGNOSIS — N186 End stage renal disease: Secondary | ICD-10-CM | POA: Diagnosis not present

## 2018-04-11 DIAGNOSIS — N2581 Secondary hyperparathyroidism of renal origin: Secondary | ICD-10-CM | POA: Diagnosis not present

## 2018-04-11 DIAGNOSIS — E1022 Type 1 diabetes mellitus with diabetic chronic kidney disease: Secondary | ICD-10-CM | POA: Diagnosis not present

## 2018-04-11 DIAGNOSIS — L97411 Non-pressure chronic ulcer of right heel and midfoot limited to breakdown of skin: Secondary | ICD-10-CM | POA: Diagnosis not present

## 2018-04-11 DIAGNOSIS — R739 Hyperglycemia, unspecified: Secondary | ICD-10-CM | POA: Diagnosis not present

## 2018-04-11 DIAGNOSIS — E10621 Type 1 diabetes mellitus with foot ulcer: Secondary | ICD-10-CM | POA: Diagnosis not present

## 2018-04-11 DIAGNOSIS — D509 Iron deficiency anemia, unspecified: Secondary | ICD-10-CM | POA: Diagnosis not present

## 2018-04-11 DIAGNOSIS — I120 Hypertensive chronic kidney disease with stage 5 chronic kidney disease or end stage renal disease: Secondary | ICD-10-CM | POA: Diagnosis not present

## 2018-04-11 DIAGNOSIS — T8781 Dehiscence of amputation stump: Secondary | ICD-10-CM | POA: Diagnosis not present

## 2018-04-13 DIAGNOSIS — N186 End stage renal disease: Secondary | ICD-10-CM | POA: Diagnosis not present

## 2018-04-13 DIAGNOSIS — T8781 Dehiscence of amputation stump: Secondary | ICD-10-CM | POA: Diagnosis not present

## 2018-04-13 DIAGNOSIS — I120 Hypertensive chronic kidney disease with stage 5 chronic kidney disease or end stage renal disease: Secondary | ICD-10-CM | POA: Diagnosis not present

## 2018-04-13 DIAGNOSIS — L97411 Non-pressure chronic ulcer of right heel and midfoot limited to breakdown of skin: Secondary | ICD-10-CM | POA: Diagnosis not present

## 2018-04-13 DIAGNOSIS — E1022 Type 1 diabetes mellitus with diabetic chronic kidney disease: Secondary | ICD-10-CM | POA: Diagnosis not present

## 2018-04-13 DIAGNOSIS — D509 Iron deficiency anemia, unspecified: Secondary | ICD-10-CM | POA: Diagnosis not present

## 2018-04-13 DIAGNOSIS — N2581 Secondary hyperparathyroidism of renal origin: Secondary | ICD-10-CM | POA: Diagnosis not present

## 2018-04-13 DIAGNOSIS — E10621 Type 1 diabetes mellitus with foot ulcer: Secondary | ICD-10-CM | POA: Diagnosis not present

## 2018-04-13 DIAGNOSIS — R739 Hyperglycemia, unspecified: Secondary | ICD-10-CM | POA: Diagnosis not present

## 2018-04-14 ENCOUNTER — Ambulatory Visit: Payer: Medicare Other | Admitting: Podiatry

## 2018-04-15 ENCOUNTER — Telehealth: Payer: Self-pay | Admitting: *Deleted

## 2018-04-15 DIAGNOSIS — N2581 Secondary hyperparathyroidism of renal origin: Secondary | ICD-10-CM | POA: Diagnosis not present

## 2018-04-15 DIAGNOSIS — R739 Hyperglycemia, unspecified: Secondary | ICD-10-CM | POA: Diagnosis not present

## 2018-04-15 DIAGNOSIS — N186 End stage renal disease: Secondary | ICD-10-CM | POA: Diagnosis not present

## 2018-04-15 DIAGNOSIS — D509 Iron deficiency anemia, unspecified: Secondary | ICD-10-CM | POA: Diagnosis not present

## 2018-04-15 DIAGNOSIS — E1022 Type 1 diabetes mellitus with diabetic chronic kidney disease: Secondary | ICD-10-CM | POA: Diagnosis not present

## 2018-04-15 NOTE — Telephone Encounter (Signed)
Dr. Amalia Hailey states return fax [facility] request for wound care orders to continue the [facility] as previously and have pt make an appt to be seen. Faxed to [facility].

## 2018-04-18 DIAGNOSIS — T8781 Dehiscence of amputation stump: Secondary | ICD-10-CM | POA: Diagnosis not present

## 2018-04-18 DIAGNOSIS — E1022 Type 1 diabetes mellitus with diabetic chronic kidney disease: Secondary | ICD-10-CM | POA: Diagnosis not present

## 2018-04-18 DIAGNOSIS — N2581 Secondary hyperparathyroidism of renal origin: Secondary | ICD-10-CM | POA: Diagnosis not present

## 2018-04-18 DIAGNOSIS — N186 End stage renal disease: Secondary | ICD-10-CM | POA: Diagnosis not present

## 2018-04-18 DIAGNOSIS — R739 Hyperglycemia, unspecified: Secondary | ICD-10-CM | POA: Diagnosis not present

## 2018-04-18 DIAGNOSIS — D509 Iron deficiency anemia, unspecified: Secondary | ICD-10-CM | POA: Diagnosis not present

## 2018-04-18 DIAGNOSIS — I120 Hypertensive chronic kidney disease with stage 5 chronic kidney disease or end stage renal disease: Secondary | ICD-10-CM | POA: Diagnosis not present

## 2018-04-18 DIAGNOSIS — L97411 Non-pressure chronic ulcer of right heel and midfoot limited to breakdown of skin: Secondary | ICD-10-CM | POA: Diagnosis not present

## 2018-04-18 DIAGNOSIS — E10621 Type 1 diabetes mellitus with foot ulcer: Secondary | ICD-10-CM | POA: Diagnosis not present

## 2018-04-18 NOTE — Progress Notes (Signed)
  Subjective:  Patient ID: [name], male    DOB: Feb 03, 1981,  MRN: [id number]  Chief Complaint  Patient presents with  . 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 male returns for wound care.  States the pain is doing better. Objective:  There were no vitals filed for this visit.   General AA&O x3. Normal mood and affect.  Vascular Foot warm to touch.  Neurologic Sensation grossly diminished.  Dermatologic (Wound) Wound Location: Right first second metatarsals plantarly Wound Measurement: 1x1 post-debridement. Wound Base: Granular/Healthy Peri-wound: Calloused Exudate: None: wound tissue dry  Wound progress: worsened since last check.   Central dehiscence with hyperkeratosis no probe to bone.  Orthopedic: No pain to palpation either foot.   Assessment & Plan:  Patient was evaluated and treated and all questions answered.  Ulcer right first second metatarsal  -Debridement as below. -Dressed with silvadene, DSD. -Continue Santyl to both wound areas with HHC -Discussed importance of wearing shoe to prevent pressure  -We will make appointment for diabetic shoes once wound is fully healed  Procedure: Excisional Debridement of Wound Rationale: Removal of non-viable soft tissue from the wound to promote healing.  Anesthesia: none Pre-Debridement Wound Measurements: overlying hyperkeratosis  Post-Debridement Wound Measurements: 1 cm x 1 cm x 0.2 cm  Type of Debridement: Excisional Tissue Removed: Non-viable soft tissue Depth of Debridement: subq Instrumentation: 3-0 mm dermal curette Technique: Sharp excisional debridement to bleeding, viable wound base.  Dressing: Dry, sterile, compression dressing. Disposition: Patient tolerated procedure well. Patient to return in 1 week for follow-up.  Status post partial fifth ray amputation  right foot with filleted toe flap -Dehiscence debrided.  Covered under global  Return in about 1 week (around 04/14/2018) for wound care right foot.

## 2018-04-20 DIAGNOSIS — N186 End stage renal disease: Secondary | ICD-10-CM | POA: Diagnosis not present

## 2018-04-20 DIAGNOSIS — R739 Hyperglycemia, unspecified: Secondary | ICD-10-CM | POA: Diagnosis not present

## 2018-04-20 DIAGNOSIS — N2581 Secondary hyperparathyroidism of renal origin: Secondary | ICD-10-CM | POA: Diagnosis not present

## 2018-04-20 DIAGNOSIS — D509 Iron deficiency anemia, unspecified: Secondary | ICD-10-CM | POA: Diagnosis not present

## 2018-04-20 DIAGNOSIS — E1022 Type 1 diabetes mellitus with diabetic chronic kidney disease: Secondary | ICD-10-CM | POA: Diagnosis not present

## 2018-04-22 DIAGNOSIS — R739 Hyperglycemia, unspecified: Secondary | ICD-10-CM | POA: Diagnosis not present

## 2018-04-22 DIAGNOSIS — D509 Iron deficiency anemia, unspecified: Secondary | ICD-10-CM | POA: Diagnosis not present

## 2018-04-22 DIAGNOSIS — E1022 Type 1 diabetes mellitus with diabetic chronic kidney disease: Secondary | ICD-10-CM | POA: Diagnosis not present

## 2018-04-22 DIAGNOSIS — N2581 Secondary hyperparathyroidism of renal origin: Secondary | ICD-10-CM | POA: Diagnosis not present

## 2018-04-22 DIAGNOSIS — N186 End stage renal disease: Secondary | ICD-10-CM | POA: Diagnosis not present

## 2018-04-25 DIAGNOSIS — N2581 Secondary hyperparathyroidism of renal origin: Secondary | ICD-10-CM | POA: Diagnosis not present

## 2018-04-25 DIAGNOSIS — D509 Iron deficiency anemia, unspecified: Secondary | ICD-10-CM | POA: Diagnosis not present

## 2018-04-25 DIAGNOSIS — Z992 Dependence on renal dialysis: Secondary | ICD-10-CM | POA: Diagnosis not present

## 2018-04-25 DIAGNOSIS — I871 Compression of vein: Secondary | ICD-10-CM | POA: Diagnosis not present

## 2018-04-25 DIAGNOSIS — E1022 Type 1 diabetes mellitus with diabetic chronic kidney disease: Secondary | ICD-10-CM | POA: Diagnosis not present

## 2018-04-25 DIAGNOSIS — R739 Hyperglycemia, unspecified: Secondary | ICD-10-CM | POA: Diagnosis not present

## 2018-04-25 DIAGNOSIS — N186 End stage renal disease: Secondary | ICD-10-CM | POA: Diagnosis not present

## 2018-04-25 DIAGNOSIS — T82868A Thrombosis of vascular prosthetic devices, implants and grafts, initial encounter: Secondary | ICD-10-CM | POA: Diagnosis not present

## 2018-04-27 DIAGNOSIS — R739 Hyperglycemia, unspecified: Secondary | ICD-10-CM | POA: Diagnosis not present

## 2018-04-27 DIAGNOSIS — E1022 Type 1 diabetes mellitus with diabetic chronic kidney disease: Secondary | ICD-10-CM | POA: Diagnosis not present

## 2018-04-27 DIAGNOSIS — D509 Iron deficiency anemia, unspecified: Secondary | ICD-10-CM | POA: Diagnosis not present

## 2018-04-27 DIAGNOSIS — N186 End stage renal disease: Secondary | ICD-10-CM | POA: Diagnosis not present

## 2018-04-27 DIAGNOSIS — N2581 Secondary hyperparathyroidism of renal origin: Secondary | ICD-10-CM | POA: Diagnosis not present

## 2018-04-28 DIAGNOSIS — I129 Hypertensive chronic kidney disease with stage 1 through stage 4 chronic kidney disease, or unspecified chronic kidney disease: Secondary | ICD-10-CM | POA: Diagnosis not present

## 2018-04-28 DIAGNOSIS — Z992 Dependence on renal dialysis: Secondary | ICD-10-CM | POA: Diagnosis not present

## 2018-04-28 DIAGNOSIS — N186 End stage renal disease: Secondary | ICD-10-CM | POA: Diagnosis not present

## 2018-04-29 ENCOUNTER — Telehealth: Payer: Self-pay | Admitting: Podiatry

## 2018-04-29 DIAGNOSIS — R739 Hyperglycemia, unspecified: Secondary | ICD-10-CM | POA: Diagnosis not present

## 2018-04-29 DIAGNOSIS — N186 End stage renal disease: Secondary | ICD-10-CM | POA: Diagnosis not present

## 2018-04-29 DIAGNOSIS — D509 Iron deficiency anemia, unspecified: Secondary | ICD-10-CM | POA: Diagnosis not present

## 2018-04-29 DIAGNOSIS — N2581 Secondary hyperparathyroidism of renal origin: Secondary | ICD-10-CM | POA: Diagnosis not present

## 2018-04-29 DIAGNOSIS — E1022 Type 1 diabetes mellitus with diabetic chronic kidney disease: Secondary | ICD-10-CM | POA: Diagnosis not present

## 2018-04-29 NOTE — Telephone Encounter (Signed)
I called [name] to check his status and get him set up for an appt. [name] states he got a misdemeanor marijuana charge not even $5.00 worth and he has been having to take care of that and his dialysis and all, so he did not make an appt. [name] states dialysis was rough today, and I asked if I could help him set up an appt with Dr. March Rummage and [name] agreed. I transferred to [place].

## 2018-04-29 NOTE — Telephone Encounter (Signed)
This is Scientific laboratory technician and I'm calling from [facility]. We have been seeing [name] for wound care to an amputated 5th ray. The nurse has documentation that the area has been healed since 15 July. Our certification period is up on Monday and when we attempted to do a discharge visit today he refused. Said we cannot discharge him until he see's Dr. March Rummage again except he hasn't had time quote on quote to make an appointment. So he's refusing to allow [place] to make a discharge visit and his certification period is up on Monday. We will try again on Monday but we will need a new order for that and/or if Dr. March Rummage really does want to see [name] before we discharge him, we could potentially get an order from Dr. March Rummage to do a late discharge because he has Medicare. My phone number is [phone]. Thank you.

## 2018-05-01 NOTE — Telephone Encounter (Signed)
Noted. Thanks.

## 2018-05-02 DIAGNOSIS — N2581 Secondary hyperparathyroidism of renal origin: Secondary | ICD-10-CM | POA: Diagnosis not present

## 2018-05-02 DIAGNOSIS — D509 Iron deficiency anemia, unspecified: Secondary | ICD-10-CM | POA: Diagnosis not present

## 2018-05-02 DIAGNOSIS — E1022 Type 1 diabetes mellitus with diabetic chronic kidney disease: Secondary | ICD-10-CM | POA: Diagnosis not present

## 2018-05-02 DIAGNOSIS — N186 End stage renal disease: Secondary | ICD-10-CM | POA: Diagnosis not present

## 2018-05-02 DIAGNOSIS — R739 Hyperglycemia, unspecified: Secondary | ICD-10-CM | POA: Diagnosis not present

## 2018-05-04 DIAGNOSIS — D509 Iron deficiency anemia, unspecified: Secondary | ICD-10-CM | POA: Diagnosis not present

## 2018-05-04 DIAGNOSIS — N2581 Secondary hyperparathyroidism of renal origin: Secondary | ICD-10-CM | POA: Diagnosis not present

## 2018-05-04 DIAGNOSIS — N186 End stage renal disease: Secondary | ICD-10-CM | POA: Diagnosis not present

## 2018-05-04 DIAGNOSIS — E1022 Type 1 diabetes mellitus with diabetic chronic kidney disease: Secondary | ICD-10-CM | POA: Diagnosis not present

## 2018-05-04 DIAGNOSIS — R739 Hyperglycemia, unspecified: Secondary | ICD-10-CM | POA: Diagnosis not present

## 2018-05-06 ENCOUNTER — Ambulatory Visit: Payer: Medicare Other | Admitting: Podiatry

## 2018-05-06 DIAGNOSIS — R739 Hyperglycemia, unspecified: Secondary | ICD-10-CM | POA: Diagnosis not present

## 2018-05-06 DIAGNOSIS — N2581 Secondary hyperparathyroidism of renal origin: Secondary | ICD-10-CM | POA: Diagnosis not present

## 2018-05-06 DIAGNOSIS — T82858A Stenosis of vascular prosthetic devices, implants and grafts, initial encounter: Secondary | ICD-10-CM | POA: Diagnosis not present

## 2018-05-06 DIAGNOSIS — Z992 Dependence on renal dialysis: Secondary | ICD-10-CM | POA: Diagnosis not present

## 2018-05-06 DIAGNOSIS — N186 End stage renal disease: Secondary | ICD-10-CM | POA: Diagnosis not present

## 2018-05-06 DIAGNOSIS — T82868A Thrombosis of vascular prosthetic devices, implants and grafts, initial encounter: Secondary | ICD-10-CM | POA: Diagnosis not present

## 2018-05-06 DIAGNOSIS — E1022 Type 1 diabetes mellitus with diabetic chronic kidney disease: Secondary | ICD-10-CM | POA: Diagnosis not present

## 2018-05-06 DIAGNOSIS — D509 Iron deficiency anemia, unspecified: Secondary | ICD-10-CM | POA: Diagnosis not present

## 2018-05-09 DIAGNOSIS — E1022 Type 1 diabetes mellitus with diabetic chronic kidney disease: Secondary | ICD-10-CM | POA: Diagnosis not present

## 2018-05-09 DIAGNOSIS — R739 Hyperglycemia, unspecified: Secondary | ICD-10-CM | POA: Diagnosis not present

## 2018-05-09 DIAGNOSIS — D509 Iron deficiency anemia, unspecified: Secondary | ICD-10-CM | POA: Diagnosis not present

## 2018-05-09 DIAGNOSIS — N186 End stage renal disease: Secondary | ICD-10-CM | POA: Diagnosis not present

## 2018-05-09 DIAGNOSIS — N2581 Secondary hyperparathyroidism of renal origin: Secondary | ICD-10-CM | POA: Diagnosis not present

## 2018-05-11 DIAGNOSIS — E1022 Type 1 diabetes mellitus with diabetic chronic kidney disease: Secondary | ICD-10-CM | POA: Diagnosis not present

## 2018-05-11 DIAGNOSIS — N2581 Secondary hyperparathyroidism of renal origin: Secondary | ICD-10-CM | POA: Diagnosis not present

## 2018-05-11 DIAGNOSIS — D509 Iron deficiency anemia, unspecified: Secondary | ICD-10-CM | POA: Diagnosis not present

## 2018-05-11 DIAGNOSIS — N186 End stage renal disease: Secondary | ICD-10-CM | POA: Diagnosis not present

## 2018-05-11 DIAGNOSIS — R739 Hyperglycemia, unspecified: Secondary | ICD-10-CM | POA: Diagnosis not present

## 2018-05-13 DIAGNOSIS — E1022 Type 1 diabetes mellitus with diabetic chronic kidney disease: Secondary | ICD-10-CM | POA: Diagnosis not present

## 2018-05-13 DIAGNOSIS — I871 Compression of vein: Secondary | ICD-10-CM | POA: Diagnosis not present

## 2018-05-13 DIAGNOSIS — D509 Iron deficiency anemia, unspecified: Secondary | ICD-10-CM | POA: Diagnosis not present

## 2018-05-13 DIAGNOSIS — N2581 Secondary hyperparathyroidism of renal origin: Secondary | ICD-10-CM | POA: Diagnosis not present

## 2018-05-13 DIAGNOSIS — N186 End stage renal disease: Secondary | ICD-10-CM | POA: Diagnosis not present

## 2018-05-13 DIAGNOSIS — R739 Hyperglycemia, unspecified: Secondary | ICD-10-CM | POA: Diagnosis not present

## 2018-05-13 DIAGNOSIS — Z992 Dependence on renal dialysis: Secondary | ICD-10-CM | POA: Diagnosis not present

## 2018-05-13 DIAGNOSIS — T82868A Thrombosis of vascular prosthetic devices, implants and grafts, initial encounter: Secondary | ICD-10-CM | POA: Diagnosis not present

## 2018-05-16 DIAGNOSIS — N186 End stage renal disease: Secondary | ICD-10-CM | POA: Diagnosis not present

## 2018-05-16 DIAGNOSIS — R739 Hyperglycemia, unspecified: Secondary | ICD-10-CM | POA: Diagnosis not present

## 2018-05-16 DIAGNOSIS — E1022 Type 1 diabetes mellitus with diabetic chronic kidney disease: Secondary | ICD-10-CM | POA: Diagnosis not present

## 2018-05-16 DIAGNOSIS — D509 Iron deficiency anemia, unspecified: Secondary | ICD-10-CM | POA: Diagnosis not present

## 2018-05-16 DIAGNOSIS — N2581 Secondary hyperparathyroidism of renal origin: Secondary | ICD-10-CM | POA: Diagnosis not present

## 2018-05-18 ENCOUNTER — Other Ambulatory Visit: Payer: Self-pay

## 2018-05-18 DIAGNOSIS — N186 End stage renal disease: Secondary | ICD-10-CM

## 2018-05-18 DIAGNOSIS — Z992 Dependence on renal dialysis: Principal | ICD-10-CM

## 2018-05-19 ENCOUNTER — Telehealth: Payer: Self-pay | Admitting: *Deleted

## 2018-05-19 ENCOUNTER — Ambulatory Visit (INDEPENDENT_AMBULATORY_CARE_PROVIDER_SITE_OTHER): Payer: Medicare Other | Admitting: Podiatry

## 2018-05-19 ENCOUNTER — Ambulatory Visit (INDEPENDENT_AMBULATORY_CARE_PROVIDER_SITE_OTHER): Payer: Medicare Other

## 2018-05-19 DIAGNOSIS — S92344A Nondisplaced fracture of fourth metatarsal bone, right foot, initial encounter for closed fracture: Secondary | ICD-10-CM | POA: Diagnosis not present

## 2018-05-19 DIAGNOSIS — Z992 Dependence on renal dialysis: Secondary | ICD-10-CM | POA: Diagnosis not present

## 2018-05-19 DIAGNOSIS — M86671 Other chronic osteomyelitis, right ankle and foot: Secondary | ICD-10-CM | POA: Diagnosis not present

## 2018-05-19 DIAGNOSIS — Z91199 Patient's noncompliance with other medical treatment and regimen due to unspecified reason: Secondary | ICD-10-CM

## 2018-05-19 DIAGNOSIS — E08621 Diabetes mellitus due to underlying condition with foot ulcer: Secondary | ICD-10-CM

## 2018-05-19 DIAGNOSIS — Z89421 Acquired absence of other right toe(s): Secondary | ICD-10-CM | POA: Diagnosis not present

## 2018-05-19 DIAGNOSIS — L97421 Non-pressure chronic ulcer of left heel and midfoot limited to breakdown of skin: Secondary | ICD-10-CM | POA: Diagnosis not present

## 2018-05-19 DIAGNOSIS — N186 End stage renal disease: Secondary | ICD-10-CM

## 2018-05-19 DIAGNOSIS — I120 Hypertensive chronic kidney disease with stage 5 chronic kidney disease or end stage renal disease: Secondary | ICD-10-CM | POA: Diagnosis not present

## 2018-05-19 DIAGNOSIS — E1022 Type 1 diabetes mellitus with diabetic chronic kidney disease: Secondary | ICD-10-CM | POA: Diagnosis not present

## 2018-05-19 DIAGNOSIS — Z9119 Patient's noncompliance with other medical treatment and regimen: Secondary | ICD-10-CM

## 2018-05-19 DIAGNOSIS — S92334A Nondisplaced fracture of third metatarsal bone, right foot, initial encounter for closed fracture: Secondary | ICD-10-CM

## 2018-05-19 DIAGNOSIS — L97411 Non-pressure chronic ulcer of right heel and midfoot limited to breakdown of skin: Secondary | ICD-10-CM

## 2018-05-19 NOTE — Telephone Encounter (Signed)
Dr. March Rummage ordered - Santyl wet-to-dry dressings to B/L foot wounds, right lower extremity unna boot with coban, 3 times a week. Orders faxed to [facility].

## 2018-05-19 NOTE — Progress Notes (Signed)
Subjective:  Patient ID: [name], male    DOB: 12/23/1980,  MRN: [id number]  Chief Complaint  Patient presents with  . Foot Ulcer    right foot -     36 y.o. male presents with the above complaint.  Returns for wound care.  Patient has no showed at least 3 times since last eval.  Multiple attempts been made to set up appointments.  Patient states that he has had some legal issues.  Has not been wearing a surgical shoe.  Has not been receiving home health care as they could not continue to come without any evaluation.  Having issues with his fistula and getting dialysis access.  Denies specific complaints about the feet.  Review of Systems: Negative except as noted in the HPI. Denies N/V/F/Ch.  Past Medical History:  Diagnosis Date  . Diabetes mellitus without complication ([name])   . Diabetic gastroparesis ([name])   . Dialysis patient ([name])   . Hypertension   . Renal disorder    Dialysis  . Sepsis ([name])     Current Outpatient Medications:  .  acetaminophen (TYLENOL) 325 MG tablet, Take 2 tablets (650 mg total) by mouth every 6 (six) hours as needed for mild pain or headache (pain/headache). Can restart this after you are done with Oxycodone/Tylenol, Disp: , Rfl:  .  amLODipine (NORVASC) 10 MG tablet, Take 10 mg by mouth daily., Disp: , Rfl:  .  cinacalcet (SENSIPAR) 30 MG tablet, Take 30 mg by mouth daily with supper., Disp: , Rfl:  .  clonazePAM (KLONOPIN) 0.5 MG tablet, Take 0.5 mg by mouth daily as needed for anxiety (sleep). , Disp: , Rfl:  .  collagenase (SANTYL) ointment, Apply 1 application topically daily., Disp: 15 g, Rfl: 0 .  insulin aspart (NOVOLOG) 100 UNIT/ML injection, Inject 5 Units into the skin 2 (two) times daily with a meal. , Disp: , Rfl:  .  insulin glargine (LANTUS) 100 UNIT/ML injection, Inject 0.1 mLs (10 Units total) into the skin at bedtime., Disp: 10 mL, Rfl: 1 .  losartan (COZAAR) 50 MG tablet, Take 50 mg by mouth daily., Disp: , Rfl:  .  ondansetron  (ZOFRAN ODT) 4 MG disintegrating tablet, Take 1 tablet (4 mg total) by mouth every 8 (eight) hours as needed for nausea or vomiting., Disp: 10 tablet, Rfl: 0 .  sevelamer carbonate (RENVELA) 800 MG tablet, Take 2,400-3,200 mg by mouth See admin instructions. Take 3 - 4 tablets (2400 mg -3200 mg) by mouth two times daily with meals, Disp: , Rfl:   Social History   Tobacco Use  Smoking Status Never Smoker  Smokeless Tobacco Never Used    Allergies  Allergen Reactions  . No Known Allergies    Objective:  There were no vitals filed for this visit. There is no height or weight on file to calculate BMI. Constitutional Well developed. Well nourished.  Vascular Dorsalis pedis pulses palpable bilaterally. Posterior tibial pulses palpable bilaterally. Capillary refill normal to all digits.  No cyanosis or clubbing noted. Pedal hair growth normal.  Neurologic Normal speech. Oriented to person, place, and time. Epicritic sensation to light touch grossly present bilaterally.  Dermatologic Nails well groomed and normal in appearance. No skin lesions.    Left fifth MPJ with open ulceration upon debridement.  Surrounding hyperkeratosis.  Serous drainage only.  No warmth or erythema.  No signs of acute infection.  Measuring 1 x 0.5 post debridement  Right sub-met 2 3 ulceration hyperkeratosis upon initial exam   one by one post debridement.  Split thickness.  No probe to bone.  No probing to capsule.  No warmth or erythema.  Malodor noted.  No  Right fifth metatarsal area with hyperkeratosis no open ulceration noted upon debridement.no surrounding warmth or erythema.  Orthopedic: Normal joint ROM without pain or crepitus bilaterally. No visible deformities. 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  No pain palpation about the third fourth fifth metatarsals right foot   Radiographs: Taken reviewed.  Right fifth metatarsal appears to cold likely due to erosion  from chronic osteomyelitis.  Fourth and third metatarsal shafts with fracture noted.  Mild callus.  No navicular displacement Assessment:   1. Diabetic ulcer of right midfoot associated with diabetes mellitus due to underlying condition, limited to breakdown of skin ([place])   2. Diabetic ulcer of left midfoot associated with diabetes mellitus due to underlying condition, limited to breakdown of skin ([name])   3. Closed nondisplaced fracture of fourth metatarsal bone of right foot, initial encounter   4. Closed nondisplaced fracture of third metatarsal bone of right foot, initial encounter   5. Chronic osteomyelitis of right foot ([place])   6. Non-compliance with treatment    Plan:  Patient was evaluated and treated and all questions answered.  Chronic osteomyelitis fifth metatarsal, possible pathologic fracture third fourth metatarsal bones -X-rays taken reviewed -Discussed with patient findings of fracture.  Possibly due to pathologic fracture from osteomyelitis or could be stress fracture or Charcot process.  As the foot is neither red nor swollen nor appears infected will hold off extensive work-up at this time. -Would consider MRI in the future. -We will consider blood work at next visit -Soft cast consisting of Unna boot compressive dressing applied to the right lower extremity.  Home health care to perform the same.  Non-Compliance with care -Discussed with patient the importance of continued regular follow-up and compliance with restrictions including wearing surgical shoes, wound care.  Ulceration left fifth MPJ -Debridement as below -Dressed with medihoney and DSD today -Orders placed for home health care to apply Santyl wet-to-dry dressing daily -Offloading surgical shoe dispensed  Procedure: Excisional Debridement of Wound Rationale: Removal of non-viable soft tissue from the wound to promote healing.  Anesthesia: none Pre-Debridement Wound Measurements: overlying hyperkeratosis    Post-Debridement Wound Measurements: 1 cm x 0.5 cm x 0.2 cm  Type of Debridement: Excisional Tissue Removed: Non-viable soft tissue Depth of Debridement: subq Instrumentation: 312 blade, tissue nipper. Technique: Sharp excisional debridement to bleeding, viable wound base.  Dressing: Dry, sterile, compression dressing. Disposition: Patient tolerated procedure well. Patient to return in 1 week for follow-up.  Ulceration plantar right forefoot -Debridement as below -Dressed with medihoney and DSD today -Orders placed for home health care to apply Santyl wet-to-dry dressing daily -Resume use of surgical shoe.  Discussed the importance of continued wearing the shoe. -Hold off diabetic shoes until wounds progress further.  Procedure: Excisional Debridement of Wound Rationale: Removal of non-viable soft tissue from the wound to promote healing.  Anesthesia: none Pre-Debridement Wound Measurements: overlying hyperkeratosis  Post-Debridement Wound Measurements: 1 cm x 1 cm x 0.3 cm  Type of Debridement: Excisional Tissue Removed: Non-viable soft tissue Depth of Debridement: subq Instrumentation: 312 blade, tissue nipper. Technique: Sharp excisional debridement to bleeding, viable wound base.  Dressing: Dry, sterile, compression dressing. Disposition: Patient tolerated procedure well. Patient to return in 1 week for follow-up.  Return in about 1 week (around 05/26/2018) for wound care, bilateral.

## 2018-05-20 ENCOUNTER — Telehealth: Payer: Self-pay | Admitting: *Deleted

## 2018-05-20 DIAGNOSIS — L97421 Non-pressure chronic ulcer of left heel and midfoot limited to breakdown of skin: Secondary | ICD-10-CM

## 2018-05-20 DIAGNOSIS — E08621 Diabetes mellitus due to underlying condition with foot ulcer: Secondary | ICD-10-CM

## 2018-05-20 DIAGNOSIS — L97411 Non-pressure chronic ulcer of right heel and midfoot limited to breakdown of skin: Principal | ICD-10-CM

## 2018-05-20 NOTE — Telephone Encounter (Signed)
[name] - [facility] states they can no longer accept pt, he is rude, and often refuses to allow them to treat. I informed Dr. March Rummage. Dr. March Rummage ordered pt to [facility]. I informed pt Dr. March Rummage was referring him to [facility] for more treatments his wounds. Pt states, "okay, baby."

## 2018-05-23 ENCOUNTER — Encounter (HOSPITAL_COMMUNITY): Payer: Self-pay | Admitting: Emergency Medicine

## 2018-05-23 ENCOUNTER — Ambulatory Visit (INDEPENDENT_AMBULATORY_CARE_PROVIDER_SITE_OTHER): Payer: Medicare Other | Admitting: Surgery

## 2018-05-23 ENCOUNTER — Other Ambulatory Visit: Payer: Self-pay

## 2018-05-23 ENCOUNTER — Ambulatory Visit (INDEPENDENT_AMBULATORY_CARE_PROVIDER_SITE_OTHER)
Admission: RE | Admit: 2018-05-23 | Discharge: 2018-05-23 | Disposition: A | Discharge status: Home / Self Care | Payer: Medicare Other | Source: Ambulatory Visit | Attending: Surgery | Admitting: Surgery

## 2018-05-23 ENCOUNTER — Ambulatory Visit (HOSPITAL_COMMUNITY)
Admission: RE | Admit: 2018-05-23 | Discharge: 2018-05-23 | Disposition: A | Discharge status: Home / Self Care | Payer: Medicare Other | Source: Ambulatory Visit | Attending: Surgery | Admitting: Surgery

## 2018-05-23 ENCOUNTER — Encounter: Payer: Self-pay | Admitting: Surgery

## 2018-05-23 ENCOUNTER — Other Ambulatory Visit: Payer: Self-pay | Admitting: *Deleted

## 2018-05-23 ENCOUNTER — Non-Acute Institutional Stay (HOSPITAL_COMMUNITY)
Admission: EM | Admit: 2018-05-23 | Discharge: 2018-05-24 | Disposition: A | Discharge status: Home / Self Care | Payer: Medicare Other | Attending: Emergency Medicine | Admitting: Emergency Medicine

## 2018-05-23 VITALS — BP 145/92 | HR 86 | Temp 97.5°F | Resp 16 | Ht 66.0 in | Wt 182.0 lb

## 2018-05-23 DIAGNOSIS — E1043 Type 1 diabetes mellitus with diabetic autonomic (poly)neuropathy: Secondary | ICD-10-CM | POA: Insufficient documentation

## 2018-05-23 DIAGNOSIS — Y828 Other medical devices associated with adverse incidents: Secondary | ICD-10-CM | POA: Diagnosis not present

## 2018-05-23 DIAGNOSIS — N186 End stage renal disease: Secondary | ICD-10-CM

## 2018-05-23 DIAGNOSIS — E1022 Type 1 diabetes mellitus with diabetic chronic kidney disease: Secondary | ICD-10-CM | POA: Diagnosis not present

## 2018-05-23 DIAGNOSIS — I120 Hypertensive chronic kidney disease with stage 5 chronic kidney disease or end stage renal disease: Secondary | ICD-10-CM | POA: Insufficient documentation

## 2018-05-23 DIAGNOSIS — Z992 Dependence on renal dialysis: Secondary | ICD-10-CM

## 2018-05-23 DIAGNOSIS — T82898A Other specified complication of vascular prosthetic devices, implants and grafts, initial encounter: Secondary | ICD-10-CM | POA: Insufficient documentation

## 2018-05-23 DIAGNOSIS — Z79899 Other long term (current) drug therapy: Secondary | ICD-10-CM | POA: Diagnosis not present

## 2018-05-23 DIAGNOSIS — K3184 Gastroparesis: Secondary | ICD-10-CM | POA: Diagnosis not present

## 2018-05-23 DIAGNOSIS — E875 Hyperkalemia: Secondary | ICD-10-CM | POA: Diagnosis not present

## 2018-05-23 DIAGNOSIS — Z794 Long term (current) use of insulin: Secondary | ICD-10-CM | POA: Insufficient documentation

## 2018-05-23 LAB — CBC WITH DIFFERENTIAL/PLATELET
Abs Immature Granulocytes: 0 10*3/uL (ref 0.0–0.1)
Basophils Absolute: 0.1 10*3/uL (ref 0.0–0.1)
Basophils Relative: 1 %
Eosinophils Absolute: 0.6 10*3/uL (ref 0.0–0.7)
Eosinophils Relative: 7 %
HCT: 40 % (ref 39.0–52.0)
Hemoglobin: 12.8 g/dL — ABNORMAL LOW (ref 13.0–17.0)
Immature Granulocytes: 0 %
Lymphocytes Relative: 20 %
Lymphs Abs: 1.7 10*3/uL (ref 0.7–4.0)
MCH: 28.6 pg (ref 26.0–34.0)
MCHC: 32 g/dL (ref 30.0–36.0)
MCV: 89.3 fL (ref 78.0–100.0)
Monocytes Absolute: 0.6 10*3/uL (ref 0.1–1.0)
Monocytes Relative: 7 %
Neutro Abs: 5.6 10*3/uL (ref 1.7–7.7)
Neutrophils Relative %: 65 %
Platelets: 259 10*3/uL (ref 150–400)
RBC: 4.48 MIL/uL (ref 4.22–5.81)
RDW: 14.6 % (ref 11.5–15.5)
WBC: 8.6 10*3/uL (ref 4.0–10.5)

## 2018-05-23 LAB — I-STAT CHEM 8, ED
BUN: 127 mg/dL — ABNORMAL HIGH (ref 6–20)
Calcium, Ion: 1.13 mmol/L — ABNORMAL LOW (ref 1.15–1.40)
Chloride: 102 mmol/L (ref 98–111)
Creatinine, Ser: >18 mg/dL — ABNORMAL HIGH (ref 0.61–1.24)
Glucose, Bld: 115 mg/dL — ABNORMAL HIGH (ref 70–99)
HCT: 40 % (ref 39.0–52.0)
Hemoglobin: 13.6 g/dL (ref 13.0–17.0)
Potassium: 6.2 mmol/L — ABNORMAL HIGH (ref 3.5–5.1)
Sodium: 130 mmol/L — ABNORMAL LOW (ref 135–145)
TCO2: 20 mmol/L — ABNORMAL LOW (ref 22–32)

## 2018-05-23 LAB — BASIC METABOLIC PANEL
Anion gap: 18 — ABNORMAL HIGH (ref 5–15)
BUN: 124 mg/dL — ABNORMAL HIGH (ref 6–20)
CO2: 19 mmol/L — ABNORMAL LOW (ref 22–32)
Calcium: 9.3 mg/dL (ref 8.9–10.3)
Chloride: 96 mmol/L — ABNORMAL LOW (ref 98–111)
Creatinine, Ser: 19.21 mg/dL — ABNORMAL HIGH (ref 0.61–1.24)
GFR calc Af Amer: 3 mL/min — ABNORMAL LOW (ref >60)
GFR calc non Af Amer: 3 mL/min — ABNORMAL LOW (ref >60)
Glucose, Bld: 117 mg/dL — ABNORMAL HIGH (ref 70–99)
Potassium: 6.3 mmol/L (ref 3.5–5.1)
Sodium: 133 mmol/L — ABNORMAL LOW (ref 135–145)

## 2018-05-23 LAB — CBG MONITORING, ED: Glucose-Capillary: 175 mg/dL — ABNORMAL HIGH (ref 70–99)

## 2018-05-23 MED ORDER — ALTEPLASE 2 MG IJ SOLR: 2.0000 mg | Freq: Once | INTRAMUSCULAR | Status: DC | PRN

## 2018-05-23 MED ORDER — PENTAFLUOROPROP-TETRAFLUOROETH EX AERO
1.0000 "application " | INHALATION_SPRAY | CUTANEOUS | Status: DC | PRN
  Filled 2018-05-23: qty 103.5

## 2018-05-23 MED ORDER — FUROSEMIDE 10 MG/ML IJ SOLN
40.0000 mg | Freq: Once | INTRAMUSCULAR | Status: AC
  Administered 2018-05-23: 40 mg via INTRAVENOUS
  Filled 2018-05-23: qty 4

## 2018-05-23 MED ORDER — SODIUM CHLORIDE 0.9 % IV SOLN: 100.0000 mL | INTRAVENOUS | Status: DC | PRN

## 2018-05-23 MED ORDER — HEPARIN SODIUM (PORCINE) 1000 UNIT/ML DIALYSIS
1000.0000 [IU] | INTRAMUSCULAR | Status: DC | PRN
  Administered 2018-05-24: 1000 [IU] via INTRAVENOUS_CENTRAL
  Filled 2018-05-23 (×2): qty 1

## 2018-05-23 MED ORDER — LIDOCAINE HCL (PF) 1 % IJ SOLN: 5.0000 mL | INTRAMUSCULAR | Status: DC | PRN

## 2018-05-23 MED ORDER — CHLORHEXIDINE GLUCONATE CLOTH 2 % EX PADS: 6.0000 | MEDICATED_PAD | Freq: Every day | CUTANEOUS | Status: DC

## 2018-05-23 MED ORDER — LIDOCAINE-PRILOCAINE 2.5-2.5 % EX CREA
1.0000 "application " | TOPICAL_CREAM | CUTANEOUS | Status: DC | PRN
  Filled 2018-05-23: qty 5

## 2018-05-23 MED ORDER — ACETAMINOPHEN 325 MG PO TABS
650.0000 mg | ORAL_TABLET | Freq: Once | ORAL | Status: AC
  Administered 2018-05-23: 650 mg via ORAL
  Filled 2018-05-23: qty 2

## 2018-05-23 MED ORDER — HEPARIN SODIUM (PORCINE) 1000 UNIT/ML IJ SOLN
2.8000 mL | Freq: Once | INTRAMUSCULAR | Status: AC
  Administered 2018-05-24: 2800 [IU]
  Filled 2018-05-23: qty 2.8

## 2018-05-23 MED ORDER — HEPARIN SODIUM (PORCINE) 1000 UNIT/ML DIALYSIS
20.0000 [IU]/kg | INTRAMUSCULAR | Status: DC | PRN
  Filled 2018-05-23: qty 2

## 2018-05-23 NOTE — Progress Notes (Signed)
[facility] [place]  Patient name: [name] MRN: [id number] DOB: 12/26/1980 Sex: male   REASON FOR VISIT:    dialysis  HISOTRY OF PRESENT ILLNESS:    [name] is a 37 y.o. male who was sent over to the office for dialysis access.  Unfortunately the patient thought he was getting his procedure done today.  He has a history of bilateral upper arm graft which is now occluded.  He has not had dialysis for at least 1 week.  He is refusing to have a graft placed in his thigh.  He is refusing to have a catheter.   PAST MEDICAL HISTORY:   Past Medical History:  Diagnosis Date  . Diabetes mellitus without complication ([place])   . Diabetic gastroparesis ([place])   . Dialysis patient ([place])   . Hypertension   . Renal disorder    Dialysis  . Sepsis ([place])      FAMILY HISTORY:   Family History  Problem Relation Age of Onset  . Diabetes Mellitus II Unknown     SOCIAL HISTORY:   Social History   Tobacco Use  . Smoking status: Never Smoker  . Smokeless tobacco: Never Used  Substance Use Topics  . Alcohol use: No     ALLERGIES:   Allergies  Allergen Reactions  . No Known Allergies      CURRENT MEDICATIONS:   Current Outpatient Medications  Medication Sig Dispense Refill  . acetaminophen (TYLENOL) 325 MG tablet Take 2 tablets (650 mg total) by mouth every 6 (six) hours as needed for mild pain or headache (pain/headache). Can restart this after you are done with Oxycodone/Tylenol    . amLODipine (NORVASC) 10 MG tablet Take 10 mg by mouth daily.    . cinacalcet (SENSIPAR) 30 MG tablet Take 30 mg by mouth daily with supper.    . clonazePAM (KLONOPIN) 0.5 MG tablet Take 0.5 mg by mouth daily as needed for anxiety (sleep).     . collagenase (SANTYL) ointment Apply 1 application topically daily. 15 g 0  . insulin aspart (NOVOLOG) 100 UNIT/ML injection Inject 5 Units into the skin 2 (two) times daily with a meal.      . insulin glargine (LANTUS) 100 UNIT/ML injection Inject 0.1 mLs (10 Units total) into the skin at bedtime. 10 mL 1  . losartan (COZAAR) 50 MG tablet Take 50 mg by mouth daily.    . ondansetron (ZOFRAN ODT) 4 MG disintegrating tablet Take 1 tablet (4 mg total) by mouth every 8 (eight) hours as needed for nausea or vomiting. 10 tablet 0  . sevelamer carbonate (RENVELA) 800 MG tablet Take 2,400-3,200 mg by mouth See admin instructions. Take 3 - 4 tablets (2400 mg -3200 mg) by mouth two times daily with meals     No current facility-administered medications for this visit.     REVIEW OF SYSTEMS:   [X]  denotes positive finding, [ ]  denotes negative finding Cardiac  Comments:  Chest pain or chest pressure:    Shortness of breath upon exertion:    Short of breath when lying flat:    Irregular heart rhythm:        Vascular    Pain in calf, thigh, or hip brought on by ambulation:    Pain in feet at night that wakes you up from your sleep:     Blood clot in your veins:    Leg swelling:         Pulmonary  Oxygen at home:    Productive cough:     Wheezing:         Neurologic    Sudden weakness in arms or legs:     Sudden numbness in arms or legs:     Sudden onset of difficulty speaking or slurred speech:    Temporary loss of vision in one eye:     Problems with dizziness:         Gastrointestinal    Blood in stool:     Vomited blood:         Genitourinary    Burning when urinating:     Blood in urine:        Psychiatric    Major depression:         Hematologic    Bleeding problems:    Problems with blood clotting too easily:        Skin    Rashes or ulcers:        Constitutional    Fever or chills:      PHYSICAL EXAM:   Vitals:   05/23/18 1509  BP: (!) 145/92  Pulse: 86  Resp: 16  Temp: (!) 97.5 F (36.4 C)  TempSrc: Oral  SpO2: 100%  Weight: 182 lb (82.6 kg)  Height: 5\' 6"  (1.676 m)    GENERAL: The patient is a well-nourished male, in no acute  distress. The vital signs are documented above. CARDIAC: There is a regular rate and rhythm.  PULMONARY: Non-labored respirations MUSCULOSKELETAL: There are no major deformities or cyanosis. NEUROLOGIC: No focal weakness or paresthesias are detected. SKIN: There are no ulcers or rashes noted. PSYCHIATRIC: The patient has a normal affect.  STUDIES:   Arterial duplex shows no evidence of arterial insufficiency in bilateral upper extremities. Vein mapping shows small caliber cephalic and basilic veins bilaterally  MEDICAL ISSUES:   I discussed with the patient that before proceeding with any kind of access in the upper extremities, he will need bilateral upper extremity venogram to see if he has a vein that can be utilized.  I meant to try to get this done on Wednesday and then I told him I would place a quick stick graft in on Thursday if he has an adequate vein.  I told him that he needed to get dialysis in the interim.  I spoke with Dr. Detterding about this and he agrees.  I think the best thing for the patient is to go to the emergency department.  I would like for him to have a temporary catheter placed and then have dialysis performed.  The catheter can be removed after dialysis, and he can return on Wednesday for his venogram.  Patient is agreeable to this plan    Annamarie Major, MD [facility] [phone] Pager [phone]

## 2018-05-23 NOTE — H&P (View-Only) (Signed)
[facility]ist of [place]  Patient name: [name] MRN: [id number] DOB: Feb 15, 1981 Sex: male   REASON FOR VISIT:    dialysis  HISOTRY OF PRESENT ILLNESS:    [name] is a 37 y.o. male who was sent over to the office for dialysis access.  Unfortunately the patient thought he was getting his procedure done today.  He has a history of bilateral upper arm graft which is now occluded.  He has not had dialysis for at least 1 week.  He is refusing to have a graft placed in his thigh.  He is refusing to have a catheter.   PAST MEDICAL HISTORY:   Past Medical History:  Diagnosis Date  . Diabetes mellitus without complication ([place])   . Diabetic gastroparesis ([place])   . Dialysis patient ([place])   . Hypertension   . Renal disorder    Dialysis  . Sepsis ([place])      FAMILY HISTORY:   Family History  Problem Relation Age of Onset  . Diabetes Mellitus II Unknown     SOCIAL HISTORY:   Social History   Tobacco Use  . Smoking status: Never Smoker  . Smokeless tobacco: Never Used  Substance Use Topics  . Alcohol use: No     ALLERGIES:   Allergies  Allergen Reactions  . No Known Allergies      CURRENT MEDICATIONS:   Current Outpatient Medications  Medication Sig Dispense Refill  . acetaminophen (TYLENOL) 325 MG tablet Take 2 tablets (650 mg total) by mouth every 6 (six) hours as needed for mild pain or headache (pain/headache). Can restart this after you are done with Oxycodone/Tylenol    . amLODipine (NORVASC) 10 MG tablet Take 10 mg by mouth daily.    . cinacalcet (SENSIPAR) 30 MG tablet Take 30 mg by mouth daily with supper.    . clonazePAM (KLONOPIN) 0.5 MG tablet Take 0.5 mg by mouth daily as needed for anxiety (sleep).     . collagenase (SANTYL) ointment Apply 1 application topically daily. 15 g 0  . insulin aspart (NOVOLOG) 100 UNIT/ML injection Inject 5 Units into the skin 2 (two) times daily with a meal.      . insulin glargine (LANTUS) 100 UNIT/ML injection Inject 0.1 mLs (10 Units total) into the skin at bedtime. 10 mL 1  . losartan (COZAAR) 50 MG tablet Take 50 mg by mouth daily.    . ondansetron (ZOFRAN ODT) 4 MG disintegrating tablet Take 1 tablet (4 mg total) by mouth every 8 (eight) hours as needed for nausea or vomiting. 10 tablet 0  . sevelamer carbonate (RENVELA) 800 MG tablet Take 2,400-3,200 mg by mouth See admin instructions. Take 3 - 4 tablets (2400 mg -3200 mg) by mouth two times daily with meals     No current facility-administered medications for this visit.     REVIEW OF SYSTEMS:   [X]  denotes positive finding, [ ]  denotes negative finding Cardiac  Comments:  Chest pain or chest pressure:    Shortness of breath upon exertion:    Short of breath when lying flat:    Irregular heart rhythm:        Vascular    Pain in calf, thigh, or hip brought on by ambulation:    Pain in feet at night that wakes you up from your sleep:     Blood clot in your veins:    Leg swelling:         Pulmonary  Oxygen at home:    Productive cough:     Wheezing:         Neurologic    Sudden weakness in arms or legs:     Sudden numbness in arms or legs:     Sudden onset of difficulty speaking or slurred speech:    Temporary loss of vision in one eye:     Problems with dizziness:         Gastrointestinal    Blood in stool:     Vomited blood:         Genitourinary    Burning when urinating:     Blood in urine:        Psychiatric    Major depression:         Hematologic    Bleeding problems:    Problems with blood clotting too easily:        Skin    Rashes or ulcers:        Constitutional    Fever or chills:      PHYSICAL EXAM:   Vitals:   05/23/18 1509  BP: (!) 145/92  Pulse: 86  Resp: 16  Temp: (!) 97.5 F (36.4 C)  TempSrc: Oral  SpO2: 100%  Weight: 182 lb (82.6 kg)  Height: 5\' 6"  (1.676 m)    GENERAL: The patient is a well-nourished male, in no acute  distress. The vital signs are documented above. CARDIAC: There is a regular rate and rhythm.  PULMONARY: Non-labored respirations MUSCULOSKELETAL: There are no major deformities or cyanosis. NEUROLOGIC: No focal weakness or paresthesias are detected. SKIN: There are no ulcers or rashes noted. PSYCHIATRIC: The patient has a normal affect.  STUDIES:   Arterial duplex shows no evidence of arterial insufficiency in bilateral upper extremities. Vein mapping shows small caliber cephalic and basilic veins bilaterally  MEDICAL ISSUES:   I discussed with the patient that before proceeding with any kind of access in the upper extremities, he will need bilateral upper extremity venogram to see if he has a vein that can be utilized.  I meant to try to get this done on Wednesday and then I told him I would place a quick stick graft in on Thursday if he has an adequate vein.  I told him that he needed to get dialysis in the interim.  I spoke with Dr. Detterding about this and he agrees.  I think the best thing for the patient is to go to the emergency department.  I would like for him to have a temporary catheter placed and then have dialysis performed.  The catheter can be removed after dialysis, and he can return on Wednesday for his venogram.  Patient is agreeable to this plan    Annamarie Major, MD [facility] Special[facility] of [facility] [phone] Pager [phone]

## 2018-05-23 NOTE — ED Notes (Signed)
Patient signed consent form for temporary HD catheter insertion by PA , pt. assisted to use a bedside commode .

## 2018-05-23 NOTE — ED Notes (Signed)
RN at bedside assisting PA inserting temporary HD catheter .

## 2018-05-23 NOTE — ED Notes (Signed)
PA at bedside explaining temporary HD catheter insertion procedure to pt. at bedside .

## 2018-05-23 NOTE — Procedures (Signed)
Hemodialysis Insertion Procedure Note [name] [id number] 01-Sep-1981  Procedure: Insertion of Hemodialysis Catheter Type: 3 port  Indications: Hemodialysis   Procedure Details Consent: Risks of procedure as well as the alternatives and risks of each were explained to the (patient/caregiver).  Consent for procedure obtained. Time Out: Verified patient identification, verified procedure, site/side was marked, verified correct patient position, special equipment/implants available, medications/allergies/relevent history reviewed, required imaging and test results available.  Performed  Maximum sterile technique was used including antiseptics, cap, gloves, gown, hand hygiene, mask and sheet. Skin prep: Chlorhexidine; local anesthetic administered A antimicrobial bonded/coated triple lumen catheter was placed in the right femoral vein using the Seldinger technique. Ultrasound guidance used.Yes.   Catheter placed to 20 cm. Blood aspirated via all 3 ports and then flushed x 3. Line sutured x 2 and dressing applied.  Evaluation Blood flow good Complications: No apparent complications Patient did tolerate procedure well. Chest X-ray ordered to verify placement.  CXR: pending.  Georgann Housekeeper, AGACNP-BC [facility] Pulmonology/Critical Care Pager [phone] or [phone]  05/23/2018 11:48 PM

## 2018-05-23 NOTE — ED Provider Notes (Addendum)
Patient placed in Quick Look pathway, seen and evaluated   Chief Complaint: Need for dialysis   HPI:   37 y.o. male who presents for evaluation immediate dialysis.  Patient was sent over from vascular access center for need for temporary catheter.  Patient reports that his fistula on the right upper extremity clotted and became nonfunctional.  He reports his last dialysis session was approximately 10 days ago.  He is normally a Monday, Wednesday, Friday dialysis patient.  He states that he has had some mild difficulty breathing and just has felt bloated and fluid overload.  Denies any chest pain.  He reports he makes a small amount of urine.  ROS: nee for dialysis   Physical Exam:   Gen: No distress  Neuro: Awake and Alert  Skin: Warm    Focused Exam: CTAB, RRB, AV fistula noted to right upper extremity with no evidence of thrill.  Discussed with Gigi Gin, PA-C (vascular) who came and evaluated patient in the ED.  He has contacted dialysis.  He will get a separate cath inserted and then go to dialysis.  Stable after dialysis, patient is stable for discharge.   Initiation of care has begun. The patient has been counseled on the process, plan, and necessity for staying for the completion/evaluation, and the remainder of the medical screening examination    Volanda Napoleon, PA-C 05/23/18 1710    Volanda Napoleon, PA-C 05/23/18 1816    Malvin Johns, MD 05/23/18 2237

## 2018-05-23 NOTE — ED Notes (Signed)
Hemodialysis nurse notified on pt.'s temporary HD catheter insertion .

## 2018-05-23 NOTE — ED Provider Notes (Signed)
[facility] EMERGENCY DEPARTMENT Provider Note   CSN: [id number] Arrival date & time: 05/23/18  1646     History   Chief Complaint Chief Complaint  Patient presents with  . Vascular Access Problem    HPI [name] is a 37 y.o. male.  Patient is a 37 year old male with a history of diabetes, hypertension and end-stage renal disease on dialysis who presents with a need for dialysis.  He was previously receiving dialysis through an AV fistula in the right arm.  Its been clotted for over a week.  His last dialysis was 10 days ago.  He saw Dr. Trula Slade in the office today who advised him to come here and get a temporary graft and received dialysis.  He is scheduled to have a venogram in 2 days to assess a dialysis graft.  He feels bloated but otherwise denies any acute symptoms.  He denies any shortness of breath.  No increased weakness.  No nausea or vomiting.     Past Medical History:  Diagnosis Date  . Diabetes mellitus without complication ([place])   . Diabetic gastroparesis ([place])   . Dialysis patient ([place])   . Hypertension   . Renal disorder    Dialysis  . Sepsis [facility])     Patient Active Problem List   Diagnosis Date Noted  . ESRD (end stage renal disease) ([place]) 05/23/2018  . Clotted renal dialysis AV graft ([place])   . Infected skin ulcer with necrosis of bone ([place])   . Osteomyelitis ([place]) 02/01/2018  . Delirium   . Altered mental status   . Ulcer of right foot limited to breakdown of skin ([place])   . Osteomyelitis of ankle or foot, acute, right ([place]) 10/30/2017  . Abscess or cellulitis of toe, right   . Cellulitis of right lower extremity   . Right foot infection   . Diabetic foot infection ([place])   . Cellulitis of right foot 10/26/2017  . Diabetes mellitus type 1 ([place]) 10/25/2017  . Diabetic gastroparesis ([place]) 10/25/2017  . Hemodialysis patient ([place]) 10/25/2017  . Sepsis ([place]) 10/25/2017  . DKA (diabetic ketoacidoses) ([place]) 05/27/2015  .  Essential (primary) hypertension 08/09/2014  . ESRD (end stage renal disease) on dialysis ([place]) 03/19/2014  . Pain, abdominal, generalized 03/19/2014  . Bacteremia due to coagulase-negative Staphylococcus 05/31/2013  . Leukocytosis 05/31/2013  . Hyponatremia 05/29/2013  . Metabolic acidosis 52/77/8242    Past Surgical History:  Procedure Laterality Date  . AMPUTATION Right 02/02/2018   Procedure: RIGHT FIFTH TOE AND METATARSAL AMPUTATION. Filetted toe flap metatarsal resection. Debridement Plantar Foot wound;  Surgeon: Evelina Bucy, DPM;  Location: [facility];  Service: Podiatry;  Laterality: Right;  . APPLICATION OF WOUND VAC  02/02/2018   Procedure: APPLICATION OF WOUND VAC  Right Foot;  Surgeon: Evelina Bucy, DPM;  Location: [facility];  Service: Podiatry;;  . AV FISTULA PLACEMENT     left arm.  . AV FISTULA PLACEMENT Right 12/22/2016   Procedure: INSERTION OF ARTERIOVENOUS (AV) GORE-TEX GRAFT ARM;  Surgeon: Elam Dutch, MD;  Location: [facility];  Service: Vascular;  Laterality: Right;  . EYE SURGERY    . I&D EXTREMITY Right 10/31/2017   Procedure: IRRIGATION AND DEBRIDEMENT RIGHT FOOT;  Surgeon: Evelina Bucy, DPM;  Location: [facility];  Service: Podiatry;  Laterality: Right;  . INSERTION OF DIALYSIS CATHETER     Right subclavian  . IR AV DIALY SHUNT INTRO NEEDLE/INTRACATH INITIAL W/PTA/IMG RIGHT Right 02/05/2018  . IR  US GUIDE VASC ACCESS RIGHT  02/05/2018  . UPPER EXTREMITY VENOGRAPHY N/A 11/16/2016   Procedure: Upper Extremity Venography - Right Central;  Surgeon: Elam Dutch, MD;  Location: [facility] CV LAB;  Service: Cardiovascular;  Laterality: N/A;        Home Medications    Prior to Admission medications   Medication Sig Start Date End Date Taking? Authorizing Provider  acetaminophen (TYLENOL) 325 MG tablet Take 2 tablets (650 mg total) by mouth every 6 (six) hours as needed for mild pain or headache (pain/headache). Can restart this after you are done with  Oxycodone/Tylenol 11/10/17   Domenic Polite, MD  amLODipine (NORVASC) 10 MG tablet Take 10 mg by mouth daily.    [provider]  cinacalcet (SENSIPAR) 30 MG tablet Take 30 mg by mouth daily with supper.    [provider]  clonazePAM (KLONOPIN) 0.5 MG tablet Take 0.5 mg by mouth daily as needed for anxiety (sleep).  07/21/17   [provider]  collagenase (SANTYL) ointment Apply 1 application topically daily. 11/24/17   Evelina Bucy, DPM  insulin aspart (NOVOLOG) 100 UNIT/ML injection Inject 5 Units into the skin 2 (two) times daily with a meal.     [provider]  insulin glargine (LANTUS) 100 UNIT/ML injection Inject 0.1 mLs (10 Units total) into the skin at bedtime. 02/05/18   Velna Ochs, MD  losartan (COZAAR) 50 MG tablet Take 50 mg by mouth daily. 09/27/17   [provider]  ondansetron (ZOFRAN ODT) 4 MG disintegrating tablet Take 1 tablet (4 mg total) by mouth every 8 (eight) hours as needed for nausea or vomiting. 11/23/17   Ward, Ozella Almond, PA-C  sevelamer carbonate (RENVELA) 800 MG tablet Take 2,400-3,200 mg by mouth See admin instructions. Take 3 - 4 tablets (2400 mg -3200 mg) by mouth two times daily with meals    [provider]    Family History Family History  Problem Relation Age of Onset  . Diabetes Mellitus II Unknown     Social History Social History   Tobacco Use  . Smoking status: Never Smoker  . Smokeless tobacco: Never Used  Substance Use Topics  . Alcohol use: No  . Drug use: No     Allergies   Patient has no active allergies.   Review of Systems Review of Systems  Constitutional: Negative for chills, diaphoresis, fatigue and fever.  HENT: Negative for congestion, rhinorrhea and sneezing.   Eyes: Negative.   Respiratory: Negative for cough, chest tightness and shortness of breath.   Cardiovascular: Negative for chest pain and leg swelling.  Gastrointestinal: Negative for abdominal pain,  blood in stool, diarrhea, nausea and vomiting.  Genitourinary: Negative for difficulty urinating, flank pain, frequency and hematuria.  Musculoskeletal: Negative for arthralgias and back pain.  Skin: Negative for rash.  Neurological: Negative for dizziness, speech difficulty, weakness, numbness and headaches.     Physical Exam Updated Vital Signs BP (!) 156/74 (BP Location: Left Arm)   Pulse 88   Temp 98.3 F (36.8 C) (Oral)   Resp 16   SpO2 99%   Physical Exam  Constitutional: He is oriented to person, place, and time. He appears well-developed and well-nourished.  HENT:  Head: Normocephalic and atraumatic.  Eyes: Pupils are equal, round, and reactive to light.  Neck: Normal range of motion. Neck supple.  Cardiovascular: Normal rate, regular rhythm and normal heart sounds.  Pulmonary/Chest: Effort normal and breath sounds normal. No respiratory distress. He has no  wheezes. He has no rales. He exhibits no tenderness.  Abdominal: Soft. Bowel sounds are normal. There is no tenderness. There is no rebound and no guarding.  Musculoskeletal: Normal range of motion. He exhibits no edema.  AV fistula in the right upper extremity without palpable thrill  Lymphadenopathy:    He has no cervical adenopathy.  Neurological: He is alert and oriented to person, place, and time.  Skin: Skin is warm and dry. No rash noted.  Psychiatric: He has a normal mood and affect.     ED Treatments / Results  Labs (all labs ordered are listed, but only abnormal results are displayed) Labs Reviewed  BASIC METABOLIC PANEL - Abnormal; Notable for the following components:      Result Value   Sodium 133 (*)    Potassium 6.3 (*)    Chloride 96 (*)    CO2 19 (*)    Glucose, Bld 117 (*)    BUN 124 (*)    Creatinine, Ser 19.21 (*)    GFR calc non Af Amer 3 (*)    GFR calc Af Amer 3 (*)    Anion gap 18 (*)    All other components within normal limits  CBC WITH DIFFERENTIAL/PLATELET - Abnormal; Notable  for the following components:   Hemoglobin 12.8 (*)    All other components within normal limits  I-STAT CHEM 8, ED - Abnormal; Notable for the following components:   Sodium 130 (*)    Potassium 6.2 (*)    BUN 127 (*)    Creatinine, Ser >18.00 (*)    Glucose, Bld 115 (*)    Calcium, Ion 1.13 (*)    TCO2 20 (*)    All other components within normal limits  CBG MONITORING, ED - Abnormal; Notable for the following components:   Glucose-Capillary 175 (*)    All other components within normal limits    EKG None  Radiology No results found.  Procedures Procedures (including critical care time)  Medications Ordered in ED Medications  Chlorhexidine Gluconate Cloth 2 % PADS 6 each (has no administration in time range)  pentafluoroprop-tetrafluoroeth (GEBAUERS) aerosol 1 application (has no administration in time range)  lidocaine (PF) (XYLOCAINE) 1 % injection 5 mL (has no administration in time range)  lidocaine-prilocaine (EMLA) cream 1 application (has no administration in time range)  0.9 %  sodium chloride infusion (has no administration in time range)  0.9 %  sodium chloride infusion (has no administration in time range)  heparin injection 1,000 Units (has no administration in time range)  alteplase (CATHFLO ACTIVASE) injection 2 mg (has no administration in time range)  heparin injection 1,700 Units (has no administration in time range)  furosemide (LASIX) injection 40 mg (40 mg Intravenous Given 05/23/18 1912)  acetaminophen (TYLENOL) tablet 650 mg (650 mg Oral Given 05/23/18 2119)     Initial Impression / Assessment and Plan / ED Course  I have reviewed the triage vital signs and the nursing notes.  Pertinent labs & imaging results that were available during my care of the patient were reviewed by me and considered in my medical decision making (see chart for details).     Patient is a 37 year old male who presents with need for dialysis.  He is hyperkalemic.  There is  no significant EKG changes.  He was given medications for the hyperkalemia which included Lasix.  He has been seen by the nephrology PA who has consulted with CCM to place a temporary dialysis catheter.  The plan is for patient to have dialysis tonight and be discharged home following that.  CRITICAL CARE Performed by: Malvin Johns Total critical care time: 40 minutes Critical care time was exclusive of separately billable procedures and treating other patients. Critical care was necessary to treat or prevent imminent or life-threatening deterioration. Critical care was time spent personally by me on the following activities: development of treatment plan with patient and/or surrogate as well as nursing, discussions with consultants, evaluation of patient's response to treatment, examination of patient, obtaining history from patient or surrogate, ordering and performing treatments and interventions, ordering and review of laboratory studies, ordering and review of radiographic studies, pulse oximetry and re-evaluation of patient's condition.   Final Clinical Impressions(s) / ED Diagnoses   Final diagnoses:  ESRD (end stage renal disease) ([place])  Hyperkalemia    ED Discharge Orders    None       Malvin Johns, MD 05/23/18 2247

## 2018-05-23 NOTE — ED Triage Notes (Signed)
Patient sent to ED from vascular center for placement of emergency catheter for dialysis. He reports his R arm fistula "stopped responding 10 days ago." He waited until today because "he didn't want an emergency catheter placed because last time it was done, it was in their for years and they're only supposed to be in for 6 months." Patient reports bloated and swollen feeling all over and shortness of breath with exertion. Denies pain. Resp e/u, skin warm/dry.

## 2018-05-23 NOTE — Progress Notes (Signed)
Subjective:  37 yo male with ESRD MWF [name] center ) with clotted RUA AVGG   Last hd was [name] 05/16/18 . He refused Perm cath insert and was seen By Dr Trula Slade today in his ov  With plans for Bilat .upper arm venogram  This Wednesday 8/28  And place  A Stick graft in Thursday 8/29 if adequate vein found on venogram . But he would need Hemodialysis preformed before this plan. Dr Trula Slade requested Temporary Catheter placement >have dialysis> remove temp cath and return 8/28  Wednesday for venogram.   Currently in ER not sob, no chest pain , labs pending /  Objective Vital signs in last 24 hours: Vitals:   05/23/18 1705  BP: (!) 180/95  Pulse: 84  Resp: 20  Temp: 98.3 F (36.8 C)  TempSrc: Oral  SpO2: 100%   Weight change:   Physical Exam: General: alert wd wn young AAM , NAD OX3 Heart: RRR , no m,r,gallop Lungs: CTA ,non labored breathing  Abdomen: BS pos ,soft ,nt,nd  Extremities: 2+pedal edema Left / R foot wrapped  Dialysis Access: R UA AVG no bruit   Problem/Plan: 1.  Clotted AVGG - Plans as Dr Trula Slade today dw pt  + temp  HD cath [facility] tonight.( dw with CCM to place Temp cath tonight )  For dc if stable  then  Venogram 8/28 and "quick stick AVGG 8/29 if vein found  2. ESRD - HD MWF Last HD 8/19  Labs pending / HD tonight tyhen dc home if stable  3. HTN/volume - BP up sec . Volume  Up/ use uf on hd for bp  tonight has bp meds at home  4. Type 1 DM   5. HX  R 5th partial ray Amp 02/12/18  Dr March Rummage POD.  follows   [name], PA-C [facility] [phone] 05/23/2018,5:52 PM  LOS: 0 days   Labs: Basic Metabolic Panel: No results for input(s): NA, K, CL, CO2, GLUCOSE, BUN, CREATININE, CALCIUM, PHOS in the last 168 hours.  Invalid input(s): ALB Liver Function Tests: No results for input(s): AST, ALT, ALKPHOS, BILITOT, PROT, ALBUMIN in the last 168 hours. No results for input(s): LIPASE, AMYLASE in the last 168 hours. No results for input(s): AMMONIA in  the last 168 hours. CBC: No results for input(s): WBC, NEUTROABS, HGB, HCT, MCV, PLT in the last 168 hours. Cardiac Enzymes: No results for input(s): CKTOTAL, CKMB, CKMBINDEX, TROPONINI in the last 168 hours. CBG: No results for input(s): GLUCAP in the last 168 hours.  Studies/Results: No results found. Medications:  . [START ON 05/24/2018] Chlorhexidine Gluconate Cloth  6 each Topical Q0600

## 2018-05-23 NOTE — ED Notes (Signed)
I attempted to get blood. I was unsuccessful

## 2018-05-23 NOTE — ED Notes (Signed)
Nurse explained delay and plan of care , pt. upset due to long wait , additional warm blanket provided / Tyenol given for abdominal pain , repositioned for comfort .

## 2018-05-23 NOTE — ED Notes (Signed)
Pt's family member at nurse's station requesting hospital bed.

## 2018-05-24 ENCOUNTER — Ambulatory Visit: Payer: Self-pay | Admitting: Physician Assistant

## 2018-05-24 ENCOUNTER — Telehealth: Payer: Self-pay | Admitting: *Deleted

## 2018-05-24 DIAGNOSIS — E875 Hyperkalemia: Secondary | ICD-10-CM | POA: Diagnosis not present

## 2018-05-24 LAB — CBG MONITORING, ED
Glucose-Capillary: 186 mg/dL — ABNORMAL HIGH (ref 70–99)
Glucose-Capillary: 313 mg/dL — ABNORMAL HIGH (ref 70–99)

## 2018-05-24 MED ORDER — CINACALCET HCL 30 MG PO TABS
30.0000 mg | ORAL_TABLET | Freq: Every day | ORAL | Status: DC
  Filled 2018-05-24: qty 1

## 2018-05-24 MED ORDER — CLONAZEPAM 0.5 MG PO TABS
0.5000 mg | ORAL_TABLET | Freq: Every day | ORAL | Status: DC | PRN
  Administered 2018-05-24: 0.5 mg via ORAL
  Filled 2018-05-24: qty 1

## 2018-05-24 MED ORDER — LOSARTAN POTASSIUM 50 MG PO TABS: 50.0000 mg | ORAL_TABLET | Freq: Every day | ORAL | Status: DC

## 2018-05-24 MED ORDER — INSULIN GLARGINE 100 UNIT/ML ~~LOC~~ SOLN
10.0000 [IU] | Freq: Every day | SUBCUTANEOUS | Status: DC
  Administered 2018-05-24: 10 [IU] via SUBCUTANEOUS
  Filled 2018-05-24: qty 0.1

## 2018-05-24 MED ORDER — AMLODIPINE BESYLATE 5 MG PO TABS
10.0000 mg | ORAL_TABLET | Freq: Every day | ORAL | Status: DC
  Administered 2018-05-24: 10 mg via ORAL
  Filled 2018-05-24: qty 2

## 2018-05-24 MED ORDER — INSULIN ASPART 100 UNIT/ML ~~LOC~~ SOLN: 5.0000 [IU] | Freq: Two times a day (BID) | SUBCUTANEOUS | Status: DC

## 2018-05-24 NOTE — ED Notes (Signed)
Report taken from off-going RN - care assumed at this time

## 2018-05-24 NOTE — ED Notes (Signed)
Tried ordering regular diet meal tray for pt, nutrition services stated they only have an order for a renal diet.

## 2018-05-24 NOTE — ED Notes (Signed)
In to give pt his renal diet (breakfast) - pt refused stating "I ain't eating that. I ordered sausage, eggs, grits, bacon and toast and that is what I want"; informed pt that a renal diet was ordered and also clarified with MD; hence, this is why a renal diet was delivered; pt demanding what he "ordred"; again explained rationale for renal diet; pt reports ED MD stated "earlier" that he could have "anything he wanted to eat"; clarified this with MD who in fact did tell pt this; regular diet lunch tray will be ordered, offered pt two available choices, pulled pork sandwich or chicken pot pie; pt declined both stating "I usually order what I want when I am on the floor"; informed pt that in the ED we had minimal choices, further offered pt a bag lunch with [place] sandwich, apple sauce and graham crackers; pt appeared increasingly angry, continuing to verbalize his discontent with food choices; again offered sandwich which pt now accepts

## 2018-05-24 NOTE — ED Notes (Signed)
RN attempted to order patient breakfast. Patient refuses renal diet states I am not going to eat that.

## 2018-05-24 NOTE — ED Notes (Signed)
Pt's fiance' in - pt speaking very loudly and cursing; refusing d/c paperwork; also refusing d/c VS; fiance' asked what had been done; I then asked pt if he gave me permission to speak with fiance' - pt replied "yes"; spoke with fiance' about care rendered while in ED as well as food issue; fiance' very polite - states she will stop to get him food after d/c

## 2018-05-24 NOTE — ED Notes (Signed)
[place] sandwich, apple sauce and diet sprite given; pt again upset - refusing this food stating "This food is not right. I want something else - explained to pt that we had given him all available options and he had declined each one; further informed him that he was being d/c'd and that if he chose not to eat what we provided then he was free to eat whatever he preferred after being d/c'd

## 2018-05-24 NOTE — ED Provider Notes (Signed)
Patient returned from dialysis.  I discussed case with Dr. Florene Glen who requested that the temporary catheter be removed.  I went to talk with the patient do stated that he did not feel comfortable having the catheter removed.  He wanted to wait an hour or 2 after dialysis and wanted to get his breakfast.  A renal diet was ordered for the patient.  However, he balked at that and stated that every time he is here he gets a regular diet and he will not eat a renal diet.  He is also refusing to allow me to remove the temporary dialysis catheter.  He will be observed in the ED for a brief period of time.  If unable to get him to go home, he may need observation admission.  Case is signed out to Dr. Sherry Ruffing.   Delora Fuel, MD 64/68/03 [phone]

## 2018-05-24 NOTE — Discharge Instructions (Signed)
Your work-up showed evidence of elevated potassium which is why you ended up getting dialysis overnight.  Please follow-up with your kidney doctor.  If any symptoms change or worsen, please return to the nearest emergency department.

## 2018-05-24 NOTE — ED Notes (Signed)
Pharmacist notified on pt.'s Lantus insulin order.

## 2018-05-24 NOTE — Progress Notes (Signed)
HD tx initiated via HD cath w/o problem, bilat ports: pull/push/flush equally w/o problem. However, when prim RN dropped pt off in HD unit he stated the HD cath insertion site was still slightly bleeding. When I pulled back covers I found the bottom cover soaked w/ blood in the area where it was against the Rt groin. The HD cath dsg was already reinforced and it was saturated and I saw the pt's bottom sheet had a large blood soaked area under him. I removed the reinforcement dsg and found the original HD opsite dsg to have blood pooled up underneath it. I removed that dsg and once I removed the biopatch I could visualize a steady continuous stream of blood from insertion site flowing back to the groin/testicle area. The area was cleaned and I put 2 packs of gel foam around the insertion site and covered w/ 2 packs of 4x4s w/ hypofix pressure dsg. Prim RN was called to let know the extent of the bleeding and requested them to forward this to the person who inserted the HD cath to see if they would come up and assess for me. VSS w/ increased bp, will cont to monitor while on HD tx

## 2018-05-24 NOTE — ED Notes (Addendum)
Pt to call Rodena Piety at [phone] in reference to status

## 2018-05-24 NOTE — Progress Notes (Signed)
Reinforced HD dsg already almost completely stain marked, NP that inserted HD cath now at bedside to assess, is going to manually hold pressure

## 2018-05-24 NOTE — ED Notes (Signed)
MD aware patient needs order for cath removal. Patient in room. Md also aware will have to D/C patient. Patient, he does not think he needs to be discharged. And would like to be evaluated for admission.

## 2018-05-24 NOTE — ED Provider Notes (Signed)
Care assumed from Dr. Roxanne Mins.  At time of transfer care, patient is awaiting temporary catheter removal and discharge.  Previous team report the patient refused removal until he had breakfast.  Diet was ordered.  Plan of care is to remove catheter and discharge patient for outpatient follow-up.  12:00 PM Patient vascular catheter was removed without difficulty at the bedside.  Patient was perseverating on food and getting something else to eat.  Patient was offered both a breakfast tray and then a lunch tray and according to nursing, patient did not want into the options available.  Patient was informed he can get his own food after discharge or is available options.  Patient will be discharged for outpatient follow-up.   Clinical Impression: 1. ESRD (end stage renal disease) ([place])   2. Hyperkalemia     Disposition: Discharge  Condition: Good  I have discussed the results, Dx and Tx plan with the pt(& family if present). He/she/they expressed understanding and agree(s) with the plan. Discharge instructions discussed at great length. Strict return precautions discussed and pt &/or family have verbalized understanding of the instructions. No further questions at time of discharge.    New Prescriptions   No medications on file    Follow Up: [place] [address] [id number] [place] [phone]    your nephrologist        [place] Marchitto, Gwenyth Allegra, MD 05/24/18 1622

## 2018-05-24 NOTE — ED Notes (Signed)
PA notified on pt.'s CBG result.

## 2018-05-24 NOTE — Telephone Encounter (Signed)
Call to patient instructed to be at [facility] admitting department at 10 am on 05/25/18 for procedure. Take 1/2 dose pm insulin tonight and have a high protein snack before midnight. Then npo except for am meds with sips of water. Take Losartan, amlodipine and clonazepam and hold all others till after the procedure. Must have a driver and caregiver for discharge. Verbalized understanding and agreed to call on pm of 05/25/18 for surgery instructions.

## 2018-05-25 ENCOUNTER — Encounter (HOSPITAL_COMMUNITY): Payer: Self-pay | Admitting: *Deleted

## 2018-05-25 ENCOUNTER — Encounter (HOSPITAL_COMMUNITY)
Admission: RE | Disposition: A | Discharge status: Home / Self Care | Payer: Self-pay | Source: Ambulatory Visit | Attending: Vascular Surgery

## 2018-05-25 ENCOUNTER — Ambulatory Visit (HOSPITAL_COMMUNITY)
Admission: RE | Admit: 2018-05-25 | Discharge: 2018-05-25 | Disposition: A | Discharge status: Home / Self Care | Payer: Medicare Other | Source: Ambulatory Visit | Attending: Vascular Surgery | Admitting: Vascular Surgery

## 2018-05-25 ENCOUNTER — Other Ambulatory Visit: Payer: Self-pay

## 2018-05-25 DIAGNOSIS — E1143 Type 2 diabetes mellitus with diabetic autonomic (poly)neuropathy: Secondary | ICD-10-CM | POA: Insufficient documentation

## 2018-05-25 DIAGNOSIS — N186 End stage renal disease: Secondary | ICD-10-CM | POA: Diagnosis not present

## 2018-05-25 DIAGNOSIS — E1122 Type 2 diabetes mellitus with diabetic chronic kidney disease: Secondary | ICD-10-CM | POA: Diagnosis not present

## 2018-05-25 DIAGNOSIS — I871 Compression of vein: Secondary | ICD-10-CM | POA: Insufficient documentation

## 2018-05-25 DIAGNOSIS — N185 Chronic kidney disease, stage 5: Secondary | ICD-10-CM | POA: Diagnosis not present

## 2018-05-25 DIAGNOSIS — Z992 Dependence on renal dialysis: Secondary | ICD-10-CM | POA: Insufficient documentation

## 2018-05-25 DIAGNOSIS — I120 Hypertensive chronic kidney disease with stage 5 chronic kidney disease or end stage renal disease: Secondary | ICD-10-CM | POA: Diagnosis not present

## 2018-05-25 DIAGNOSIS — Z794 Long term (current) use of insulin: Secondary | ICD-10-CM | POA: Insufficient documentation

## 2018-05-25 DIAGNOSIS — K3184 Gastroparesis: Secondary | ICD-10-CM | POA: Insufficient documentation

## 2018-05-25 HISTORY — PX: UPPER EXTREMITY VENOGRAPHY: CATH118272

## 2018-05-25 LAB — POCT I-STAT, CHEM 8
BUN: 74 mg/dL — ABNORMAL HIGH (ref 6–20)
Calcium, Ion: 1.13 mmol/L — ABNORMAL LOW (ref 1.15–1.40)
Chloride: 104 mmol/L (ref 98–111)
Creatinine, Ser: 15.6 mg/dL — ABNORMAL HIGH (ref 0.61–1.24)
Glucose, Bld: 197 mg/dL — ABNORMAL HIGH (ref 70–99)
HCT: 44 % (ref 39.0–52.0)
Hemoglobin: 15 g/dL (ref 13.0–17.0)
Potassium: 6 mmol/L — ABNORMAL HIGH (ref 3.5–5.1)
Sodium: 132 mmol/L — ABNORMAL LOW (ref 135–145)
TCO2: 24 mmol/L (ref 22–32)

## 2018-05-25 SURGERY — UPPER EXTREMITY VENOGRAPHY
Anesthesia: LOCAL

## 2018-05-25 MED ORDER — SODIUM CHLORIDE 0.9% FLUSH: 3.0000 mL | INTRAVENOUS | Status: DC | PRN

## 2018-05-25 MED ORDER — IODIXANOL 320 MG/ML IV SOLN
INTRAVENOUS | Status: DC | PRN
  Administered 2018-05-25: 50 mL via INTRAVENOUS

## 2018-05-25 SURGICAL SUPPLY — 2 items
STOPCOCK MORSE 400PSI 3WAY (MISCELLANEOUS) ×4 IMPLANT
TUBING CIL FLEX 10 FLL-RA (TUBING) ×4 IMPLANT

## 2018-05-25 NOTE — Progress Notes (Signed)
Unable to obtain IV and blood order placed for IV team consult.

## 2018-05-25 NOTE — Discharge Instructions (Signed)
Venogram, Care After °This sheet gives you information about how to care for yourself after your procedure. Your health care provider may also give you more specific instructions. If you have problems or questions, contact your health care provider. °What can I expect after the procedure? °After the procedure, it is common to have: °· Bruising or mild discomfort in the area where the IV was inserted (insertion site). ° °Follow these instructions at home: °Eating and drinking °· Follow instructions from your health care provider about eating or drinking restrictions. °· Drink a lot of fluids for the first several days after the procedure, as directed by your health care provider. This helps to wash (flush) the contrast out of your body. Examples of healthy fluids include water or low-calorie drinks. °General instructions °· Check your IV insertion area every day for signs of infection. Check for: °? Redness, swelling, or pain. °? Fluid or blood. °? Warmth. °? Pus or a bad smell. °· Take over-the-counter and prescription medicines only as told by your health care provider. °· Rest and return to your normal activities as told by your health care provider. Ask your health care provider what activities are safe for you. °· Do not drive for 24 hours if you were given a medicine to help you relax (sedative), or until your health care provider approves. °· Keep all follow-up visits as told by your health care provider. This is important. °Contact a health care provider if: °· Your skin becomes itchy or you develop a rash or hives. °· You have a fever that does not get better with medicine. °· You feel nauseous. °· You vomit. °· You have redness, swelling, or pain around the insertion site. °· You have fluid or blood coming from the insertion site. °· Your insertion area feels warm to the touch. °· You have pus or a bad smell coming from the insertion site. °Get help right away if: °· You have difficulty breathing or  shortness of breath. °· You develop chest pain. °· You faint. °· You feel very dizzy. °These symptoms may represent a serious problem that is an emergency. Do not wait to see if the symptoms will go away. Get medical help right away. Call your local emergency services (911 in the [place] Do not drive yourself to the hospital. °Summary °· After your procedure, it is common to have bruising or mild discomfort in the area where the IV was inserted. °· You should check your IV insertion area every day for signs of infection. °· Take over-the-counter and prescription medicines only as told by your health care provider. °· You should drink a lot of fluids for the first several days after the procedure to help flush the contrast from your body. °This information is not intended to replace advice given to you by your health care provider. Make sure you discuss any questions you have with your health care provider. °Document Released: 07/05/2013 Document Revised: 08/08/2016 Document Reviewed: 08/08/2016 °Elsevier Interactive Patient Education © 2017 [employer] ° °

## 2018-05-25 NOTE — Progress Notes (Signed)
Spoke with pt for pre-op call, but pt was driving and stated he was tied up for the evening. He asked me to text him the instructions, I told him that I didn't have that capability but I could leave the instructions on his voicemail and he said to do that. I called him back and he did not have a voicemail. I called him again and he did answer and he said I could call his mother. I called [name] and she was also driving. She said she does have a voicemail and I could leave the instructions on her voicemail, which I did.

## 2018-05-25 NOTE — H&P (Signed)
History and Physical Interval Note:  05/25/2018 11:42 AM  [name]  has presented today for surgery, with the diagnosis of instage renal  The various methods of treatment have been discussed with the patient and family. After consideration of risks, benefits and other options for treatment, the patient has consented to  Procedure(s): UPPER EXTREMITY VENOGRAPHY - Bilateral (N/A) as a surgical intervention .  The patient's history has been reviewed, patient examined, no change in status, stable for surgery.  I have reviewed the patient's chart and labs.  Questions were answered to the patient's satisfaction.    Bilateral upper extremity venogram to plan for dialysis access.  Marty Heck  Vascular and Vein Specialist of [facility]  Patient name: [name]  MRN: [id number]        DOB: 1980-12-08        Sex: male   REASON FOR VISIT:    dialysis  HISOTRY OF PRESENT ILLNESS:    [name] is a 37 y.o. male who was sent over to the office for dialysis access.  Unfortunately the patient thought he was getting his procedure done today.  He has a history of bilateral upper arm graft which is now occluded.  He has not had dialysis for at least 1 week.  He is refusing to have a graft placed in his thigh.  He is refusing to have a catheter.   PAST MEDICAL HISTORY:       Past Medical History:  Diagnosis Date  . Diabetes mellitus without complication ([place])   . Diabetic gastroparesis ([place])   . Dialysis patient ([place])   . Hypertension   . Renal disorder    Dialysis  . Sepsis ([name])      FAMILY HISTORY:        Family History  Problem Relation Age of Onset  . Diabetes Mellitus II Unknown     SOCIAL HISTORY:   Social History       Tobacco Use  . Smoking status: Never Smoker  . Smokeless tobacco: Never Used  Substance Use Topics  . Alcohol use: No     ALLERGIES:       Allergies  Allergen Reactions  . No Known Allergies       CURRENT MEDICATIONS:         Current Outpatient Medications  Medication Sig Dispense Refill  . acetaminophen (TYLENOL) 325 MG tablet Take 2 tablets (650 mg total) by mouth every 6 (six) hours as needed for mild pain or headache (pain/headache). Can restart this after you are done with Oxycodone/Tylenol    . amLODipine (NORVASC) 10 MG tablet Take 10 mg by mouth daily.    . cinacalcet (SENSIPAR) 30 MG tablet Take 30 mg by mouth daily with supper.    . clonazePAM (KLONOPIN) 0.5 MG tablet Take 0.5 mg by mouth daily as needed for anxiety (sleep).     . collagenase (SANTYL) ointment Apply 1 application topically daily. 15 g 0  . insulin aspart (NOVOLOG) 100 UNIT/ML injection Inject 5 Units into the skin 2 (two) times daily with a meal.     . insulin glargine (LANTUS) 100 UNIT/ML injection Inject 0.1 mLs (10 Units total) into the skin at bedtime. 10 mL 1  . losartan (COZAAR) 50 MG tablet Take 50 mg by mouth daily.    . ondansetron (ZOFRAN ODT) 4 MG disintegrating tablet Take 1 tablet (4 mg total) by mouth every 8 (eight) hours as needed for nausea or vomiting. 10 tablet  0  . sevelamer carbonate (RENVELA) 800 MG tablet Take 2,400-3,200 mg by mouth See admin instructions. Take 3 - 4 tablets (2400 mg -3200 mg) by mouth two times daily with meals     No current facility-administered medications for this visit.     REVIEW OF SYSTEMS:   [X]  denotes positive finding, [ ]  denotes negative finding Cardiac  Comments:  Chest pain or chest pressure:    Shortness of breath upon exertion:    Short of breath when lying flat:    Irregular heart rhythm:        Vascular    Pain in calf, thigh, or hip brought on by ambulation:    Pain in feet at night that wakes you up from your sleep:     Blood clot in your veins:    Leg swelling:         Pulmonary    Oxygen at home:    Productive cough:     Wheezing:         Neurologic    Sudden  weakness in arms or legs:     Sudden numbness in arms or legs:     Sudden onset of difficulty speaking or slurred speech:    Temporary loss of vision in one eye:     Problems with dizziness:         Gastrointestinal    Blood in stool:     Vomited blood:         Genitourinary    Burning when urinating:     Blood in urine:        Psychiatric    Major depression:         Hematologic    Bleeding problems:    Problems with blood clotting too easily:        Skin    Rashes or ulcers:        Constitutional    Fever or chills:      PHYSICAL EXAM:      Vitals:   05/23/18 1509  BP: (!) 145/92  Pulse: 86  Resp: 16  Temp: (!) 97.5 F (36.4 C)  TempSrc: Oral  SpO2: 100%  Weight: 182 lb (82.6 kg)  Height: 5\' 6"  (1.676 m)    GENERAL: The patient is a well-nourished male, in no acute distress. The vital signs are documented above. CARDIAC: There is a regular rate and rhythm.  PULMONARY: Non-labored respirations MUSCULOSKELETAL: There are no major deformities or cyanosis. NEUROLOGIC: No focal weakness or paresthesias are detected. SKIN: There are no ulcers or rashes noted. PSYCHIATRIC: The patient has a normal affect.  STUDIES:   Arterial duplex shows no evidence of arterial insufficiency in bilateral upper extremities. Vein mapping shows small caliber cephalic and basilic veins bilaterally  MEDICAL ISSUES:   I discussed with the patient that before proceeding with any kind of access in the upper extremities, he will need bilateral upper extremity venogram to see if he has a vein that can be utilized.  I meant to try to get this done on Wednesday and then I told him I would place a quick stick graft in on Thursday if he has an adequate vein.  I told him that he needed to get dialysis in the interim.  I spoke with Dr. Detterding about this and he agrees.  I think the best thing for the patient is to go to the  emergency department.  I would like for him to have a temporary catheter placed  and then have dialysis performed.  The catheter can be removed after dialysis, and he can return on Wednesday for his venogram.  Patient is agreeable to this plan    Annamarie Major, MD V[facility] and Vein Specialists of [facility] [phone] Pager [phone]

## 2018-05-25 NOTE — Op Note (Signed)
Date: May 25, 2018  Preoperative diagnosis: End-stage renal disease with the need for permanent hemodialysis access after multiple previously failed bilateral upper extremity AV grafts  Postoperative diagnosis: Same  Procedure: 1.  Bilateral upper extremity venograms  Surgeon: Dr. Monica Martinez, MD  Indications: This is a 37 year old male who has undergone multiple previous access procedures.  He previously had bilateral upper extremity AV grafts that have since failed.  He was seen earlier this week in consultation for new dialysis access.  Ultimately the patient refuses any groin access procedure.  As a result we have recommended a bilateral upper extremity venogram to evaluate for new access options.  Findings: 1.  He has significant collateralization in the right upper extremity from an occluded cephalic vein proximally with filling of 2 small brachial veins as well as a patent axillary and subclavian vein.  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 2.  On the left he has a patent cephalic vein in the mid upper arm.  At least one of his deep brachial veins appears patent with subsequent filling of a second brachial vein in the upper arm and a patent axillary and subclavian vein.  There is no evidence of any central stenosis on the left and he appears to have a patent left innominate vein.  Complications: None  Details: The patient was taken to the [facility] lab after bilateral upper extremity venous access was obtained with 18 gauge IV access in the antecubital fossa.  He was placed on the procedure table and was initially positioned for a right upper extremity venogram.  Hand injected contrast was used to sequentially image his right upper extremity from his antecubitum through his chest to include central venous imaging.  Pertinent imaging findings are noted above.  We then repositioned  the table and performed hand injections of his contralateral IV in the left arm from the antecubitum up to his chest to also evaluate his central venous system.  Patient tolerated the procedure without any immediate complications.  He was taken to the PACU in stable condition.  Marty Heck, MD [facility] of [place] Office: [phone] Pager: [place]

## 2018-05-25 NOTE — Anesthesia Preprocedure Evaluation (Addendum)
Anesthesia Evaluation  Patient identified by MRN, date of birth, ID band Patient awake    Reviewed: Allergy & Precautions, NPO status , Patient's Chart, lab work & pertinent test results  History of Anesthesia Complications Negative for: history of anesthetic complications  Airway Mallampati: II  TM Distance: >3 FB Neck ROM: Full    Dental  (+) Teeth Intact, Dental Advisory Given   Pulmonary neg pulmonary ROS,    breath sounds clear to auscultation       Cardiovascular hypertension, Pt. on medications (-) angina Rhythm:Regular Rate:Normal     Neuro/Psych negative neurological ROS     GI/Hepatic negative GI ROS, Neg liver ROS,   Endo/Other  diabetes, Insulin Dependent  Renal/GU Dialysis and ESRFRenal disease (K+ 4.8)     Musculoskeletal   Abdominal Normal abdominal exam  (+)   Peds  Hematology  (+) Blood dyscrasia (Hb 9.3), anemia ,   Anesthesia Other Findings   Reproductive/Obstetrics                             Anesthesia Physical  Anesthesia Plan  ASA: III  Anesthesia Plan: General   Post-op Pain Management:    Induction: Intravenous  PONV Risk Score and Plan: 2 and Ondansetron and Treatment may vary due to age or medical condition  Airway Management Planned: LMA  Additional Equipment:   Intra-op Plan:   Post-operative Plan: Extubation in OR  Informed Consent: I have reviewed the patients History and Physical, chart, labs and discussed the procedure including the risks, benefits and alternatives for the proposed anesthesia with the patient or authorized representative who has indicated his/her understanding and acceptance.   Dental advisory given  Plan Discussed with: CRNA  Anesthesia Plan Comments:        Anesthesia Quick Evaluation

## 2018-05-26 ENCOUNTER — Ambulatory Visit (HOSPITAL_COMMUNITY): Payer: Medicare Other | Admitting: Anesthesiology

## 2018-05-26 ENCOUNTER — Ambulatory Visit (HOSPITAL_COMMUNITY)
Admission: RE | Admit: 2018-05-26 | Discharge: 2018-05-26 | Disposition: A | Discharge status: Home / Self Care | Payer: Medicare Other | Source: Ambulatory Visit | Attending: Surgery | Admitting: Surgery

## 2018-05-26 ENCOUNTER — Ambulatory Visit (INDEPENDENT_AMBULATORY_CARE_PROVIDER_SITE_OTHER): Payer: Medicare Other | Admitting: Podiatry

## 2018-05-26 ENCOUNTER — Ambulatory Visit: Payer: Medicare Other | Admitting: Podiatry

## 2018-05-26 ENCOUNTER — Encounter (HOSPITAL_COMMUNITY): Payer: Self-pay

## 2018-05-26 ENCOUNTER — Encounter (HOSPITAL_COMMUNITY)
Admission: RE | Disposition: A | Discharge status: Home / Self Care | Payer: Self-pay | Source: Ambulatory Visit | Attending: Surgery

## 2018-05-26 ENCOUNTER — Other Ambulatory Visit: Payer: Self-pay

## 2018-05-26 DIAGNOSIS — Z794 Long term (current) use of insulin: Secondary | ICD-10-CM | POA: Insufficient documentation

## 2018-05-26 DIAGNOSIS — L97421 Non-pressure chronic ulcer of left heel and midfoot limited to breakdown of skin: Secondary | ICD-10-CM

## 2018-05-26 DIAGNOSIS — E1122 Type 2 diabetes mellitus with diabetic chronic kidney disease: Secondary | ICD-10-CM | POA: Diagnosis not present

## 2018-05-26 DIAGNOSIS — N186 End stage renal disease: Secondary | ICD-10-CM | POA: Insufficient documentation

## 2018-05-26 DIAGNOSIS — Z79899 Other long term (current) drug therapy: Secondary | ICD-10-CM | POA: Diagnosis not present

## 2018-05-26 DIAGNOSIS — K3184 Gastroparesis: Secondary | ICD-10-CM | POA: Diagnosis not present

## 2018-05-26 DIAGNOSIS — T82898A Other specified complication of vascular prosthetic devices, implants and grafts, initial encounter: Secondary | ICD-10-CM | POA: Diagnosis not present

## 2018-05-26 DIAGNOSIS — E08621 Diabetes mellitus due to underlying condition with foot ulcer: Secondary | ICD-10-CM | POA: Diagnosis not present

## 2018-05-26 DIAGNOSIS — I120 Hypertensive chronic kidney disease with stage 5 chronic kidney disease or end stage renal disease: Secondary | ICD-10-CM | POA: Diagnosis not present

## 2018-05-26 DIAGNOSIS — D631 Anemia in chronic kidney disease: Secondary | ICD-10-CM | POA: Insufficient documentation

## 2018-05-26 DIAGNOSIS — Z992 Dependence on renal dialysis: Secondary | ICD-10-CM | POA: Insufficient documentation

## 2018-05-26 DIAGNOSIS — E1143 Type 2 diabetes mellitus with diabetic autonomic (poly)neuropathy: Secondary | ICD-10-CM | POA: Diagnosis not present

## 2018-05-26 DIAGNOSIS — X58XXXA Exposure to other specified factors, initial encounter: Secondary | ICD-10-CM | POA: Insufficient documentation

## 2018-05-26 DIAGNOSIS — Z833 Family history of diabetes mellitus: Secondary | ICD-10-CM | POA: Diagnosis not present

## 2018-05-26 DIAGNOSIS — L97411 Non-pressure chronic ulcer of right heel and midfoot limited to breakdown of skin: Secondary | ICD-10-CM

## 2018-05-26 HISTORY — PX: AV FISTULA PLACEMENT: SHX1204

## 2018-05-26 LAB — GLUCOSE, CAPILLARY
Glucose-Capillary: 172 mg/dL — ABNORMAL HIGH (ref 70–99)
Glucose-Capillary: 237 mg/dL — ABNORMAL HIGH (ref 70–99)
Glucose-Capillary: 224 mg/dL — ABNORMAL HIGH (ref 70–99)
Glucose-Capillary: 257 mg/dL — ABNORMAL HIGH (ref 70–99)

## 2018-05-26 LAB — POCT I-STAT 4, (NA,K, GLUC, HGB,HCT)
Glucose, Bld: 166 mg/dL — ABNORMAL HIGH (ref 70–99)
HCT: 36 % — ABNORMAL LOW (ref 39.0–52.0)
Hemoglobin: 12.2 g/dL — ABNORMAL LOW (ref 13.0–17.0)
Potassium: 5.9 mmol/L — ABNORMAL HIGH (ref 3.5–5.1)
Sodium: 131 mmol/L — ABNORMAL LOW (ref 135–145)

## 2018-05-26 LAB — HEMOGLOBIN A1C
Hgb A1c MFr Bld: 8.4 % — ABNORMAL HIGH (ref 4.8–5.6)
Mean Plasma Glucose: 194.38 mg/dL

## 2018-05-26 SURGERY — INSERTION OF ARTERIOVENOUS (AV) GORE-TEX GRAFT ARM
Anesthesia: General | Site: Arm Upper | Laterality: Left

## 2018-05-26 MED ORDER — HEPARIN SODIUM (PORCINE) 1000 UNIT/ML IJ SOLN
INTRAMUSCULAR | Status: DC | PRN
  Administered 2018-05-26: 5000 [IU] via INTRAVENOUS

## 2018-05-26 MED ORDER — 0.9 % SODIUM CHLORIDE (POUR BTL) OPTIME
TOPICAL | Status: DC | PRN
  Administered 2018-05-26: 1000 mL

## 2018-05-26 MED ORDER — PROPOFOL 10 MG/ML IV BOLUS
INTRAVENOUS | Status: AC
  Filled 2018-05-26: qty 20

## 2018-05-26 MED ORDER — CEFAZOLIN SODIUM-DEXTROSE 2-4 GM/100ML-% IV SOLN
2.0000 g | INTRAVENOUS | Status: AC
  Administered 2018-05-26: 2 g via INTRAVENOUS
  Filled 2018-05-26: qty 100

## 2018-05-26 MED ORDER — HEMOSTATIC AGENTS (NO CHARGE) OPTIME
TOPICAL | Status: DC | PRN
  Administered 2018-05-26: 1 via TOPICAL

## 2018-05-26 MED ORDER — PHENYLEPHRINE 40 MCG/ML (10ML) SYRINGE FOR IV PUSH (FOR BLOOD PRESSURE SUPPORT)
PREFILLED_SYRINGE | INTRAVENOUS | Status: AC
  Filled 2018-05-26: qty 10

## 2018-05-26 MED ORDER — DEXAMETHASONE SODIUM PHOSPHATE 10 MG/ML IJ SOLN
INTRAMUSCULAR | Status: AC
  Filled 2018-05-26: qty 1

## 2018-05-26 MED ORDER — DEXAMETHASONE SODIUM PHOSPHATE 4 MG/ML IJ SOLN
INTRAMUSCULAR | Status: DC | PRN
  Administered 2018-05-26: 4 mg via INTRAVENOUS

## 2018-05-26 MED ORDER — FENTANYL CITRATE (PF) 100 MCG/2ML IJ SOLN
25.0000 ug | INTRAMUSCULAR | Status: DC | PRN
  Administered 2018-05-26: 50 ug via INTRAVENOUS

## 2018-05-26 MED ORDER — INSULIN ASPART 100 UNIT/ML ~~LOC~~ SOLN
SUBCUTANEOUS | Status: AC
  Filled 2018-05-26: qty 1

## 2018-05-26 MED ORDER — LIDOCAINE 2% (20 MG/ML) 5 ML SYRINGE
INTRAMUSCULAR | Status: DC | PRN
  Administered 2018-05-26: 60 mg via INTRAVENOUS

## 2018-05-26 MED ORDER — SODIUM CHLORIDE 0.9 % IV SOLN
INTRAVENOUS | Status: AC
  Filled 2018-05-26: qty 1.2

## 2018-05-26 MED ORDER — MIDAZOLAM HCL 5 MG/5ML IJ SOLN
INTRAMUSCULAR | Status: DC | PRN
  Administered 2018-05-26: 2 mg via INTRAVENOUS

## 2018-05-26 MED ORDER — PROTAMINE SULFATE 10 MG/ML IV SOLN
INTRAVENOUS | Status: AC
  Filled 2018-05-26: qty 5

## 2018-05-26 MED ORDER — DEXMEDETOMIDINE HCL IN NACL 200 MCG/50ML IV SOLN
INTRAVENOUS | Status: DC | PRN
  Administered 2018-05-26: 4 ug via INTRAVENOUS
  Administered 2018-05-26: 8 ug via INTRAVENOUS

## 2018-05-26 MED ORDER — HEPARIN SODIUM (PORCINE) 1000 UNIT/ML IJ SOLN
INTRAMUSCULAR | Status: AC
  Filled 2018-05-26: qty 1

## 2018-05-26 MED ORDER — ONDANSETRON HCL 4 MG/2ML IJ SOLN
INTRAMUSCULAR | Status: DC | PRN
  Administered 2018-05-26: 8 mg via INTRAVENOUS

## 2018-05-26 MED ORDER — OXYCODONE-ACETAMINOPHEN 5-325 MG PO TABS: 1.0000 | ORAL_TABLET | Freq: Four times a day (QID) | ORAL | 0 refills | Status: DC | PRN

## 2018-05-26 MED ORDER — CHLORHEXIDINE GLUCONATE 4 % EX LIQD: 60.0000 mL | Freq: Once | CUTANEOUS | Status: DC

## 2018-05-26 MED ORDER — MEPERIDINE HCL 50 MG/ML IJ SOLN: 6.2500 mg | INTRAMUSCULAR | Status: DC | PRN

## 2018-05-26 MED ORDER — LIDOCAINE 2% (20 MG/ML) 5 ML SYRINGE
INTRAMUSCULAR | Status: AC
  Filled 2018-05-26: qty 5

## 2018-05-26 MED ORDER — PROMETHAZINE HCL 25 MG/ML IJ SOLN: 6.2500 mg | INTRAMUSCULAR | Status: DC | PRN

## 2018-05-26 MED ORDER — ONDANSETRON HCL 4 MG/2ML IJ SOLN
INTRAMUSCULAR | Status: AC
  Filled 2018-05-26: qty 2

## 2018-05-26 MED ORDER — SODIUM CHLORIDE 0.9 % IV SOLN
INTRAVENOUS | Status: DC
  Administered 2018-05-26: 07:00:00 via INTRAVENOUS

## 2018-05-26 MED ORDER — FENTANYL CITRATE (PF) 100 MCG/2ML IJ SOLN
INTRAMUSCULAR | Status: DC | PRN
  Administered 2018-05-26: 50 ug via INTRAVENOUS
  Administered 2018-05-26: 25 ug via INTRAVENOUS
  Administered 2018-05-26: 50 ug via INTRAVENOUS

## 2018-05-26 MED ORDER — MIDAZOLAM HCL 2 MG/2ML IJ SOLN
INTRAMUSCULAR | Status: AC
  Filled 2018-05-26: qty 2

## 2018-05-26 MED ORDER — PROPOFOL 10 MG/ML IV BOLUS
INTRAVENOUS | Status: DC | PRN
  Administered 2018-05-26: 200 mg via INTRAVENOUS
  Administered 2018-05-26: 30 mg via INTRAVENOUS

## 2018-05-26 MED ORDER — LIDOCAINE-EPINEPHRINE (PF) 1 %-1:200000 IJ SOLN
INTRAMUSCULAR | Status: AC
  Filled 2018-05-26: qty 30

## 2018-05-26 MED ORDER — PHENYLEPHRINE 40 MCG/ML (10ML) SYRINGE FOR IV PUSH (FOR BLOOD PRESSURE SUPPORT)
PREFILLED_SYRINGE | INTRAVENOUS | Status: DC | PRN
  Administered 2018-05-26 (×5): 80 ug via INTRAVENOUS
  Administered 2018-05-26: 40 ug via INTRAVENOUS
  Administered 2018-05-26 (×2): 80 ug via INTRAVENOUS

## 2018-05-26 MED ORDER — INSULIN ASPART 100 UNIT/ML ~~LOC~~ SOLN
5.0000 [IU] | Freq: Once | SUBCUTANEOUS | Status: AC
  Administered 2018-05-26: 5 [IU] via SUBCUTANEOUS

## 2018-05-26 MED ORDER — DEXMEDETOMIDINE HCL IN NACL 200 MCG/50ML IV SOLN
INTRAVENOUS | Status: AC
  Filled 2018-05-26: qty 50

## 2018-05-26 MED ORDER — FENTANYL CITRATE (PF) 250 MCG/5ML IJ SOLN
INTRAMUSCULAR | Status: AC
  Filled 2018-05-26: qty 5

## 2018-05-26 MED ORDER — SODIUM CHLORIDE 0.9 % IV SOLN
INTRAVENOUS | Status: DC | PRN
  Administered 2018-05-26: 500 mL

## 2018-05-26 MED ORDER — FENTANYL CITRATE (PF) 100 MCG/2ML IJ SOLN
INTRAMUSCULAR | Status: AC
  Filled 2018-05-26: qty 2

## 2018-05-26 SURGICAL SUPPLY — 35 items
ARMBAND PINK RESTRICT EXTREMIT (MISCELLANEOUS) ×6 IMPLANT
CANISTER SUCT 3000ML PPV (MISCELLANEOUS) ×3 IMPLANT
CLIP VESOCCLUDE MED 6/CT (CLIP) ×3 IMPLANT
CLIP VESOCCLUDE SM WIDE 6/CT (CLIP) ×3 IMPLANT
COVER PROBE W GEL 5X96 (DRAPES) ×3 IMPLANT
DERMABOND ADHESIVE PROPEN (GAUZE/BANDAGES/DRESSINGS) ×2
DERMABOND ADVANCED (GAUZE/BANDAGES/DRESSINGS) ×2
DERMABOND ADVANCED .7 DNX12 (GAUZE/BANDAGES/DRESSINGS) ×1 IMPLANT
DERMABOND ADVANCED .7 DNX6 (GAUZE/BANDAGES/DRESSINGS) ×1 IMPLANT
ELECT REM PT RETURN 9FT ADLT (ELECTROSURGICAL) ×3
ELECTRODE REM PT RTRN 9FT ADLT (ELECTROSURGICAL) ×1 IMPLANT
GLOVE BIOGEL PI IND STRL 7.5 (GLOVE) ×1 IMPLANT
GLOVE BIOGEL PI INDICATOR 7.5 (GLOVE) ×2
GLOVE SURG SS PI 7.0 STRL IVOR (GLOVE) ×3 IMPLANT
GLOVE SURG SS PI 7.5 STRL IVOR (GLOVE) ×3 IMPLANT
GOWN STRL REUS W/ TWL LRG LVL3 (GOWN DISPOSABLE) ×2 IMPLANT
GOWN STRL REUS W/ TWL XL LVL3 (GOWN DISPOSABLE) ×2 IMPLANT
GOWN STRL REUS W/TWL LRG LVL3 (GOWN DISPOSABLE) ×4
GOWN STRL REUS W/TWL XL LVL3 (GOWN DISPOSABLE) ×4
GRAFT VASC ACUSEAL 4-7X45 (Vascular Products) ×3 IMPLANT
HEMOSTAT SNOW SURGICEL 2X4 (HEMOSTASIS) ×3 IMPLANT
KIT BASIN OR (CUSTOM PROCEDURE TRAY) ×3 IMPLANT
KIT TURNOVER KIT B (KITS) ×3 IMPLANT
NS IRRIG 1000ML POUR BTL (IV SOLUTION) ×3 IMPLANT
PACK CV ACCESS (CUSTOM PROCEDURE TRAY) ×3 IMPLANT
PAD ARMBOARD 7.5X6 YLW CONV (MISCELLANEOUS) ×6 IMPLANT
SUT MNCRL AB 4-0 PS2 18 (SUTURE) ×3 IMPLANT
SUT PROLENE 6 0 BV (SUTURE) ×9 IMPLANT
SUT VIC AB 3-0 SH 27 (SUTURE) ×4
SUT VIC AB 3-0 SH 27X BRD (SUTURE) ×2 IMPLANT
SUT VICRYL 4-0 PS2 18IN ABS (SUTURE) IMPLANT
SYR TOOMEY 50ML (SYRINGE) IMPLANT
TOWEL GREEN STERILE (TOWEL DISPOSABLE) ×3 IMPLANT
UNDERPAD 30X30 (UNDERPADS AND DIAPERS) ×3 IMPLANT
WATER STERILE IRR 1000ML POUR (IV SOLUTION) ×3 IMPLANT

## 2018-05-26 NOTE — Progress Notes (Signed)
Inpatient Diabetes Program Recommendations  AACE/ADA: New Consensus Statement on Inpatient Glycemic Control (2019)  Target Ranges:  Prepandial:   less than 140 mg/dL      Peak postprandial:   less than 180 mg/dL (1-2 hours)      Critically ill patients:  140 - 180 mg/dL  Results for TABARI, VOLKERT (MRN [id number]) as of 05/26/2018 11:43  Ref. Range 05/26/2018 06:01 05/26/2018 09:35 05/26/2018 10:38 05/26/2018 10:55  Glucose-Capillary Latest Ref Range: 70 - 99 mg/dL 172 (H) 237 (H) 257 (H) 224 (H)  Results for YERICK, EGGEBRECHT (MRN [id number]) as of 05/26/2018 11:43  Ref. Range 05/25/2018 13:16 05/26/2018 06:18 05/26/2018 06:28  Glucose Latest Ref Range: 70 - 99 mg/dL 197 (H) 166 (H)   Hemoglobin A1C Latest Ref Range: 4.8 - 5.6 %   8.4 (H)    Review of Glycemic Control  Diabetes history: DM2 Outpatient Diabetes medications: Lantus 10 units QHS, Novolog 5 units BID Current orders for Inpatient glycemic control: None; in PACU at this time  Inpatient Diabetes Program Recommendations:  Insulin - Basal: If patient is admitted, please consider ordering Lantus 9 units QHS (based on 83.9 kg x 0.1 units). Correction (SSI): If patient is admitted, please consider ordering CBGs with Novolog 0-9 units TID with meals and Novolog 0-5 units QHS. Insulin - Meal Coverage: If patient is admitted, please consider ordering Novolog 2 units TID with meals for meal coverage if patient eats at least 50% of meals.  Thanks, Barnie Alderman, RN, MSN, CDE Diabetes Coordinator Inpatient Diabetes Program [phone] (Team Pager from 8am to 5pm)

## 2018-05-26 NOTE — Transfer of Care (Signed)
Immediate Anesthesia Transfer of Care Note  Patient: [name]  Procedure(s) Performed: INSERTION OF  ARTERIOVENOUS (AV) GORE-TEX GRAFT LEFT ARM (Left Arm Upper)  Patient Location: PACU  Anesthesia Type:General  Level of Consciousness: unresponsive and drowsy  Airway & Oxygen Therapy: Patient Spontanous Breathing and Patient connected to nasal cannula oxygen  Post-op Assessment: Report given to RN and Post -op Vital signs reviewed and stable  Post vital signs: Reviewed and stable  Last Vitals:  Vitals Value Taken Time  BP 114/61 05/26/2018  9:35 AM  Temp    Pulse 86 05/26/2018  9:38 AM  Resp 18 05/26/2018  9:38 AM  SpO2 100 % 05/26/2018  9:38 AM  Vitals shown include unvalidated device data.  Last Pain:  Vitals:   05/26/18 0607  TempSrc: Oral  PainSc: 0-No pain         Complications: No apparent anesthesia complications

## 2018-05-26 NOTE — Anesthesia Postprocedure Evaluation (Signed)
Anesthesia Post Note  Patient: [name]  Procedure(s) Performed: INSERTION OF  ARTERIOVENOUS (AV) GORE-TEX GRAFT LEFT ARM (Left Arm Upper)     Patient location during evaluation: PACU Anesthesia Type: General Level of consciousness: sedated and patient cooperative Pain management: pain level controlled Vital Signs Assessment: post-procedure vital signs reviewed and stable Respiratory status: spontaneous breathing Cardiovascular status: stable Anesthetic complications: no Comments: Hyperglycemia covered in PACU with insulin. FSBG trending down. Otherwise stable, and at baseline with FSBG.    Last Vitals:  Vitals:   05/26/18 1100 05/26/18 1110  BP: 127/80 126/88  Pulse: 86 88  Resp: 16   Temp:    SpO2: 96% 98%    Last Pain:  Vitals:   05/26/18 1059  TempSrc:   PainSc: [name]

## 2018-05-26 NOTE — Op Note (Signed)
Patient name: [name] MRN: [id number] DOB: 10/27/1980 Sex: male  05/26/2018 Pre-operative Diagnosis: ESRD Post-operative diagnosis:  Same Surgeon:  Annamarie Major Assistants: Laurence Slate Procedure:   Left upper arm AV GG (4 x 7) Anesthesia: General Blood Loss: Minimal Specimens: None  Findings: The 4 mm tapered and was sewn to the brachial artery which was calcified measuring approximately 3 mm.  The venous anastomosis was and to side to a healthy appearing brachial vein  Indications: The patient presented to the office on Monday, having not had dialysis in over a week.  He was feeling very poor.  He has a history of bilateral upper extremity grafts which have all failed.  He is refusing a tunneled catheter.  He does not want a graft placed in his legs.  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  The left sided central venous system appeared to be patent.  Therefore we decided to proceed with a quick stick left upper extremity graft  Procedure:  The patient was identified in the holding area and taken to [place] [facility]  The patient was then placed supine on the table. general anesthesia was administered.  The patient was prepped and draped in the usual sterile fashion.  A time out was called and antibiotics were administered.  Ultrasound was used to evaluate the proximal brachial vein which did appear to be healthy.  He did appear to have a basilic vein which potentially could be used for access however since he is refusing a catheter, our only option was to place a Accuseal graft.  A transverse incision was made proximal to the antecubital crease.  Through this incision I exposed the brachial artery which was calcified and diseased measuring 3-3.5 mm.  A separate transverse incision was made up near the axilla.  Through this incision I exposed the proximal brachial vein which was a 5 mm vein without significant disease.  I then used a  curved Gore tunneler to create a tunnel between the 2 incisions.  A 4 x 7 Accuseal graft was then brought through the tunnel.  The brachial artery was then occluded with vascular clamps.  I used the tapered portion of the graft for the arterial anastomosis.  The artery was occluded with vascular clamps after giving 5000 units of heparin.  A #11 blade was used to make an arteriotomy which was extended longitudinally with Potts scissors.  A running end-to-side anastomosis was created with 6-0 Prolene.  Prior to completion, the appropriate flushing maneuvers were performed and the anastomosis was completed.  There was excellent pulsatile flow through the graft which was occluded near the arterial anastomosis.  The graft was cut the appropriate length after occluding the proximal brachial vein.  The vein was then opened with a #11 blade and opened longitudinally with Potts scissors.  The graft was spatulated and a end-to-side anastomosis was created with 6-0 Prolene.  Prior to completion, the appropriate flushing maneuvers were performed and the anastomosis was completed.  Patient had a good thrill within the graft.  He had brisk Doppler signals in the radial and ulnar artery.  50 mg of protamine was then given.  Once hemostasis was satisfactory, both incisions were closed by reapproximating the deep tissue with 3-0 Vicryl, and skin with 4-0 Monocryl.  Dermabond was applied.  There were no immediate complications.   Disposition: To PACU stable   V. Annamarie Major, M.D. [facility] [place] Office:  [phone] Pager:  [phone]

## 2018-05-26 NOTE — Progress Notes (Signed)
Reviewed patient's medication list with him.  He reports the medication list is accurate and is not missing any medications.  Will contact pharmacy tech when they arrive at 0700 but per patient the list is correct with last doses documented

## 2018-05-26 NOTE — Discharge Instructions (Signed)
Acuseal AV graft left UE may stick immediately.

## 2018-05-26 NOTE — Anesthesia Procedure Notes (Signed)
Procedure Name: LMA Insertion Date/Time: 05/26/2018 7:42 AM Performed by: Orlie Dakin, CRNA Pre-anesthesia Checklist: Patient identified, Emergency Drugs available, Suction available and Patient being monitored Patient Re-evaluated:Patient Re-evaluated prior to induction Oxygen Delivery Method: Circle system utilized Preoxygenation: Pre-oxygenation with 100% oxygen Induction Type: IV induction Ventilation: Mask ventilation without difficulty LMA: LMA inserted LMA Size: 4.0 Number of attempts: 1 Placement Confirmation: positive ETCO2,  CO2 detector and breath sounds checked- equal and bilateral Tube secured with: Tape Dental Injury: Teeth and Oropharynx as per pre-operative assessment  Comments: LMA placed by Violet Baldy

## 2018-05-26 NOTE — Interval H&P Note (Signed)
History and Physical Interval Note:  05/26/2018 7:26 AM  [name]  has presented today for surgery, with the diagnosis of END STAGE RENAL DISEASE FOR HEMODIALYSIS ACCESS  The various methods of treatment have been discussed with the patient and family. After consideration of risks, benefits and other options for treatment, the patient has consented to  Procedure(s): INSERTION OF ARTERIOVENOUS (AV) GORE-TEX GRAFT LEFT ARM (Left) as a surgical intervention .  The patient's history has been reviewed, patient examined, no change in status, stable for surgery.  I have reviewed the patient's chart and labs.  Questions were answered to the patient's satisfaction.     [name]  Venogram reveals a patent L sided central venous system.  Plan for Left Arm AVGG  WB

## 2018-05-27 ENCOUNTER — Encounter (HOSPITAL_COMMUNITY): Payer: Self-pay | Admitting: Surgery

## 2018-05-27 DIAGNOSIS — E1022 Type 1 diabetes mellitus with diabetic chronic kidney disease: Secondary | ICD-10-CM | POA: Diagnosis not present

## 2018-05-27 DIAGNOSIS — N186 End stage renal disease: Secondary | ICD-10-CM | POA: Diagnosis not present

## 2018-05-27 DIAGNOSIS — D509 Iron deficiency anemia, unspecified: Secondary | ICD-10-CM | POA: Diagnosis not present

## 2018-05-27 DIAGNOSIS — N2581 Secondary hyperparathyroidism of renal origin: Secondary | ICD-10-CM | POA: Diagnosis not present

## 2018-05-27 DIAGNOSIS — R739 Hyperglycemia, unspecified: Secondary | ICD-10-CM | POA: Diagnosis not present

## 2018-05-27 DIAGNOSIS — N185 Chronic kidney disease, stage 5: Secondary | ICD-10-CM | POA: Diagnosis not present

## 2018-05-27 NOTE — Progress Notes (Signed)
Subjective:  Patient ID: [name], male    DOB: [birth year],  MRN: [id number]  Chief Complaint  Patient presents with  . Diabetic Ulcer    bilateral    37 y.o. male presents with the above complaint.  Returns for wound care.  Underwent surgery on his left arm fistula today.  Patient very somnolent.  Denies new complaints.  Has kept the [place] boot on the right lower extremity.  Patient has been dismissed from home health care and no agency will take him that works with his insurance.  Review of Systems: Negative except as noted in the HPI. Denies N/V/F/Ch.  Past Medical History:  Diagnosis Date  . Diabetes mellitus without complication ([place])   . Diabetic gastroparesis ([place])   . Dialysis patient ([place])   . Hypertension   . Renal disorder    Dialysis  . Sepsis ([place])     Current Outpatient Medications:  .  acetaminophen (TYLENOL) 325 MG tablet, Take 2 tablets (650 mg total) by mouth every 6 (six) hours as needed for mild pain or headache (pain/headache). Can restart this after you are done with Oxycodone/Tylenol, Disp: , Rfl:  .  amLODipine (NORVASC) 10 MG tablet, Take 10 mg by mouth daily., Disp: , Rfl:  .  cinacalcet (SENSIPAR) 30 MG tablet, Take 30 mg by mouth daily with supper., Disp: , Rfl:  .  clonazePAM (KLONOPIN) 0.5 MG tablet, Take 0.5 mg by mouth daily as needed for anxiety (sleep). , Disp: , Rfl:  .  collagenase (SANTYL) ointment, Apply 1 application topically daily., Disp: 15 g, Rfl: 0 .  insulin aspart (NOVOLOG) 100 UNIT/ML injection, Inject 5 Units into the skin 2 (two) times daily with a meal. , Disp: , Rfl:  .  insulin glargine (LANTUS) 100 UNIT/ML injection, Inject 0.1 mLs (10 Units total) into the skin at bedtime., Disp: 10 mL, Rfl: 1 .  losartan (COZAAR) 50 MG tablet, Take 50 mg by mouth daily., Disp: , Rfl:  .  ondansetron (ZOFRAN ODT) 4 MG disintegrating tablet, Take 1 tablet (4 mg total) by mouth every 8 (eight) hours as needed for nausea or vomiting., Disp:  10 tablet, Rfl: 0 .  oxyCODONE-acetaminophen (PERCOCET/ROXICET) 5-325 MG tablet, Take 1 tablet by mouth every 6 (six) hours as needed., Disp: 10 tablet, Rfl: 0 .  sevelamer carbonate (RENVELA) 800 MG tablet, Take 2,400-3,200 mg by mouth See admin instructions. Take 3 - 4 tablets (2400 mg -3200 mg) by mouth two times daily with meals, Disp: , Rfl:   Social History   Tobacco Use  Smoking Status Never Smoker  Smokeless Tobacco Never Used    No Known Allergies Objective:  There were no vitals filed for this visit. There is no height or weight on file to calculate BMI. Constitutional Well developed. Well nourished.  Vascular Dorsalis pedis pulses palpable bilaterally. Posterior tibial pulses palpable bilaterally. Capillary refill normal to all digits.  No cyanosis or clubbing noted. Pedal hair growth normal.  Neurologic Normal speech. Oriented to person, place, and time. Epicritic sensation to light touch grossly present bilaterally.  Dermatologic Nails well groomed and normal in appearance. No skin lesions.  Left fifth MPJ with open ulceration upon debridement.  Surrounding hyperkeratosis.  Serous drainage only.  No warmth or erythema.  No signs of acute infection.  0.5 cm x 0.5 cm upon debridement  Right sub-met 2 3 ulceration hyperkeratosis upon debridement measuring 1x1 .  Split thickness.  No probe to bone.  No probing to capsule.  No warmth or erythema.  Malodor noted.  Right fifth metatarsal area with hyperkeratosis no open ulceration noted upon debridement.no surrounding warmth or erythema.  Orthopedic: Normal joint ROM without pain or crepitus bilaterally. No visible deformities. 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  No pain palpation about the third fourth fifth metatarsals right foot   Radiographs: Taken reviewed.  Right fifth metatarsal appears to cold likely due to erosion from chronic osteomyelitis.  Fourth and third metatarsal  shafts with fracture noted.  Mild callus.  No navicular displacement Assessment:   1. Diabetic ulcer of right midfoot associated with diabetes mellitus due to underlying condition, limited to breakdown of skin ([place])   2. Diabetic ulcer of left midfoot associated with diabetes mellitus due to underlying condition, limited to breakdown of skin [facility])    Plan:  Patient was evaluated and treated and all questions answered.  Chronic osteomyelitis fifth metatarsal, possible pathologic fracture third fourth metatarsal bones -We will continue to monitor.  No acute symptoms today. -We will perform repeat x-rays in 3 weeks  Ulceration left fifth MPJ -Debridement as below -Dressed with medihoney and DSD today -Offloading surgical shoe dispensed  Ulceration plantar right forefoot -Debridement as below -Dressed with medihoney and DSD today -Unable to find home health care that can assist patient.  Refer to wound care center -Hold off diabetic shoes until wounds progress further.   Procedure: Excisional Debridement of Wound Rationale: Removal of non-viable soft tissue from the wound to promote healing.  Anesthesia: none Pre-Debridement Wound Measurements: Overlying hyperkeratosis right, 0.5 x 0.5 left  Post-Debridement Wound Measurements: 0.5 x 0.5 cm , 1 x 1 cm Type of Debridement: Sharp Excisional Tissue Removed: Non-viable soft tissue Depth of Debridement: subcutaneous tissue. Technique: Sharp excisional debridement to bleeding, viable wound base.  Dressing: Dry, sterile, compression dressing. Disposition: Patient tolerated procedure well. Patient to return in 1 week for follow-up.        Return in about 2 weeks (around 06/09/2018) for Wound Care, Bilateral.

## 2018-05-29 DIAGNOSIS — N186 End stage renal disease: Secondary | ICD-10-CM | POA: Diagnosis not present

## 2018-05-29 DIAGNOSIS — Z992 Dependence on renal dialysis: Secondary | ICD-10-CM | POA: Diagnosis not present

## 2018-05-29 DIAGNOSIS — I129 Hypertensive chronic kidney disease with stage 1 through stage 4 chronic kidney disease, or unspecified chronic kidney disease: Secondary | ICD-10-CM | POA: Diagnosis not present

## 2018-05-30 DIAGNOSIS — N2581 Secondary hyperparathyroidism of renal origin: Secondary | ICD-10-CM | POA: Diagnosis not present

## 2018-05-30 DIAGNOSIS — N186 End stage renal disease: Secondary | ICD-10-CM | POA: Diagnosis not present

## 2018-05-30 DIAGNOSIS — A499 Bacterial infection, unspecified: Secondary | ICD-10-CM | POA: Diagnosis not present

## 2018-05-30 DIAGNOSIS — D509 Iron deficiency anemia, unspecified: Secondary | ICD-10-CM | POA: Diagnosis not present

## 2018-05-30 DIAGNOSIS — E1022 Type 1 diabetes mellitus with diabetic chronic kidney disease: Secondary | ICD-10-CM | POA: Diagnosis not present

## 2018-05-30 DIAGNOSIS — R739 Hyperglycemia, unspecified: Secondary | ICD-10-CM | POA: Diagnosis not present

## 2018-05-30 DIAGNOSIS — D631 Anemia in chronic kidney disease: Secondary | ICD-10-CM | POA: Diagnosis not present

## 2018-06-01 DIAGNOSIS — R739 Hyperglycemia, unspecified: Secondary | ICD-10-CM | POA: Diagnosis not present

## 2018-06-01 DIAGNOSIS — D509 Iron deficiency anemia, unspecified: Secondary | ICD-10-CM | POA: Diagnosis not present

## 2018-06-01 DIAGNOSIS — N2581 Secondary hyperparathyroidism of renal origin: Secondary | ICD-10-CM | POA: Diagnosis not present

## 2018-06-01 DIAGNOSIS — N186 End stage renal disease: Secondary | ICD-10-CM | POA: Diagnosis not present

## 2018-06-01 DIAGNOSIS — A499 Bacterial infection, unspecified: Secondary | ICD-10-CM | POA: Diagnosis not present

## 2018-06-01 DIAGNOSIS — E1022 Type 1 diabetes mellitus with diabetic chronic kidney disease: Secondary | ICD-10-CM | POA: Diagnosis not present

## 2018-06-03 DIAGNOSIS — A499 Bacterial infection, unspecified: Secondary | ICD-10-CM | POA: Diagnosis not present

## 2018-06-03 DIAGNOSIS — E1022 Type 1 diabetes mellitus with diabetic chronic kidney disease: Secondary | ICD-10-CM | POA: Diagnosis not present

## 2018-06-03 DIAGNOSIS — D509 Iron deficiency anemia, unspecified: Secondary | ICD-10-CM | POA: Diagnosis not present

## 2018-06-03 DIAGNOSIS — N186 End stage renal disease: Secondary | ICD-10-CM | POA: Diagnosis not present

## 2018-06-03 DIAGNOSIS — N2581 Secondary hyperparathyroidism of renal origin: Secondary | ICD-10-CM | POA: Diagnosis not present

## 2018-06-03 DIAGNOSIS — R739 Hyperglycemia, unspecified: Secondary | ICD-10-CM | POA: Diagnosis not present

## 2018-06-06 DIAGNOSIS — E1022 Type 1 diabetes mellitus with diabetic chronic kidney disease: Secondary | ICD-10-CM | POA: Diagnosis not present

## 2018-06-06 DIAGNOSIS — R739 Hyperglycemia, unspecified: Secondary | ICD-10-CM | POA: Diagnosis not present

## 2018-06-06 DIAGNOSIS — N186 End stage renal disease: Secondary | ICD-10-CM | POA: Diagnosis not present

## 2018-06-06 DIAGNOSIS — D509 Iron deficiency anemia, unspecified: Secondary | ICD-10-CM | POA: Diagnosis not present

## 2018-06-06 DIAGNOSIS — A499 Bacterial infection, unspecified: Secondary | ICD-10-CM | POA: Diagnosis not present

## 2018-06-06 DIAGNOSIS — N2581 Secondary hyperparathyroidism of renal origin: Secondary | ICD-10-CM | POA: Diagnosis not present

## 2018-06-07 ENCOUNTER — Encounter (HOSPITAL_BASED_OUTPATIENT_CLINIC_OR_DEPARTMENT_OTHER): Payer: Medicare Other | Attending: Internal Medicine

## 2018-06-07 DIAGNOSIS — E10621 Type 1 diabetes mellitus with foot ulcer: Secondary | ICD-10-CM | POA: Insufficient documentation

## 2018-06-07 DIAGNOSIS — L97522 Non-pressure chronic ulcer of other part of left foot with fat layer exposed: Secondary | ICD-10-CM | POA: Diagnosis not present

## 2018-06-07 DIAGNOSIS — E1042 Type 1 diabetes mellitus with diabetic polyneuropathy: Secondary | ICD-10-CM | POA: Diagnosis not present

## 2018-06-07 DIAGNOSIS — E1040 Type 1 diabetes mellitus with diabetic neuropathy, unspecified: Secondary | ICD-10-CM | POA: Diagnosis not present

## 2018-06-08 DIAGNOSIS — D509 Iron deficiency anemia, unspecified: Secondary | ICD-10-CM | POA: Diagnosis not present

## 2018-06-08 DIAGNOSIS — A499 Bacterial infection, unspecified: Secondary | ICD-10-CM | POA: Diagnosis not present

## 2018-06-08 DIAGNOSIS — N186 End stage renal disease: Secondary | ICD-10-CM | POA: Diagnosis not present

## 2018-06-08 DIAGNOSIS — N2581 Secondary hyperparathyroidism of renal origin: Secondary | ICD-10-CM | POA: Diagnosis not present

## 2018-06-08 DIAGNOSIS — E1022 Type 1 diabetes mellitus with diabetic chronic kidney disease: Secondary | ICD-10-CM | POA: Diagnosis not present

## 2018-06-08 DIAGNOSIS — R739 Hyperglycemia, unspecified: Secondary | ICD-10-CM | POA: Diagnosis not present

## 2018-06-09 ENCOUNTER — Ambulatory Visit: Payer: Medicare Other | Admitting: Podiatry

## 2018-06-10 ENCOUNTER — Ambulatory Visit (INDEPENDENT_AMBULATORY_CARE_PROVIDER_SITE_OTHER): Payer: Medicare Other | Admitting: Podiatry

## 2018-06-10 DIAGNOSIS — L97421 Non-pressure chronic ulcer of left heel and midfoot limited to breakdown of skin: Secondary | ICD-10-CM

## 2018-06-10 DIAGNOSIS — R739 Hyperglycemia, unspecified: Secondary | ICD-10-CM | POA: Diagnosis not present

## 2018-06-10 DIAGNOSIS — Z89421 Acquired absence of other right toe(s): Secondary | ICD-10-CM | POA: Diagnosis not present

## 2018-06-10 DIAGNOSIS — E1042 Type 1 diabetes mellitus with diabetic polyneuropathy: Secondary | ICD-10-CM | POA: Diagnosis not present

## 2018-06-10 DIAGNOSIS — A499 Bacterial infection, unspecified: Secondary | ICD-10-CM | POA: Diagnosis not present

## 2018-06-10 DIAGNOSIS — N186 End stage renal disease: Secondary | ICD-10-CM | POA: Diagnosis not present

## 2018-06-10 DIAGNOSIS — D509 Iron deficiency anemia, unspecified: Secondary | ICD-10-CM | POA: Diagnosis not present

## 2018-06-10 DIAGNOSIS — E08621 Diabetes mellitus due to underlying condition with foot ulcer: Secondary | ICD-10-CM | POA: Diagnosis not present

## 2018-06-10 DIAGNOSIS — N2581 Secondary hyperparathyroidism of renal origin: Secondary | ICD-10-CM | POA: Diagnosis not present

## 2018-06-10 DIAGNOSIS — M86671 Other chronic osteomyelitis, right ankle and foot: Secondary | ICD-10-CM | POA: Diagnosis not present

## 2018-06-10 DIAGNOSIS — L97411 Non-pressure chronic ulcer of right heel and midfoot limited to breakdown of skin: Secondary | ICD-10-CM | POA: Diagnosis not present

## 2018-06-10 DIAGNOSIS — E10621 Type 1 diabetes mellitus with foot ulcer: Secondary | ICD-10-CM | POA: Diagnosis not present

## 2018-06-10 DIAGNOSIS — I120 Hypertensive chronic kidney disease with stage 5 chronic kidney disease or end stage renal disease: Secondary | ICD-10-CM | POA: Diagnosis not present

## 2018-06-10 DIAGNOSIS — E1022 Type 1 diabetes mellitus with diabetic chronic kidney disease: Secondary | ICD-10-CM | POA: Diagnosis not present

## 2018-06-10 DIAGNOSIS — L97422 Non-pressure chronic ulcer of left heel and midfoot with fat layer exposed: Secondary | ICD-10-CM | POA: Diagnosis not present

## 2018-06-10 DIAGNOSIS — Z992 Dependence on renal dialysis: Secondary | ICD-10-CM | POA: Diagnosis not present

## 2018-06-10 DIAGNOSIS — Z794 Long term (current) use of insulin: Secondary | ICD-10-CM | POA: Diagnosis not present

## 2018-06-10 NOTE — Progress Notes (Signed)
Subjective:  Patient ID: [name], male    DOB: 1981/01/01,  MRN: [id number]  Chief Complaint  Patient presents with  . Foot Ulcer    bilateral    37 y.o. male presents with the above complaint.  Did receive a home health agency that is now coming to his home and is dressing the wound.  Very somnolent today.  Patient fell asleep during examination but was awoken.  Review of Systems: Negative except as noted in the HPI. Denies N/V/F/Ch.  Past Medical History:  Diagnosis Date  . Diabetes mellitus without complication ([place])   . Diabetic gastroparesis ([place])   . Dialysis patient ([place])   . Hypertension   . Renal disorder    Dialysis  . Sepsis ([place])     Current Outpatient Medications:  .  acetaminophen (TYLENOL) 325 MG tablet, Take 2 tablets (650 mg total) by mouth every 6 (six) hours as needed for mild pain or headache (pain/headache). Can restart this after you are done with Oxycodone/Tylenol, Disp: , Rfl:  .  amLODipine (NORVASC) 10 MG tablet, Take 10 mg by mouth daily., Disp: , Rfl:  .  cinacalcet (SENSIPAR) 30 MG tablet, Take 30 mg by mouth daily with supper., Disp: , Rfl:  .  clonazePAM (KLONOPIN) 0.5 MG tablet, Take 0.5 mg by mouth daily as needed for anxiety (sleep). , Disp: , Rfl:  .  collagenase (SANTYL) ointment, Apply 1 application topically daily., Disp: 15 g, Rfl: 0 .  insulin aspart (NOVOLOG) 100 UNIT/ML injection, Inject 5 Units into the skin 2 (two) times daily with a meal. , Disp: , Rfl:  .  insulin glargine (LANTUS) 100 UNIT/ML injection, Inject 0.1 mLs (10 Units total) into the skin at bedtime., Disp: 10 mL, Rfl: 1 .  losartan (COZAAR) 50 MG tablet, Take 50 mg by mouth daily., Disp: , Rfl:  .  ondansetron (ZOFRAN ODT) 4 MG disintegrating tablet, Take 1 tablet (4 mg total) by mouth every 8 (eight) hours as needed for nausea or vomiting., Disp: 10 tablet, Rfl: 0 .  oxyCODONE-acetaminophen (PERCOCET/ROXICET) 5-325 MG tablet, Take 1 tablet by mouth every 6 (six)  hours as needed., Disp: 10 tablet, Rfl: 0 .  sevelamer carbonate (RENVELA) 800 MG tablet, Take 2,400-3,200 mg by mouth See admin instructions. Take 3 - 4 tablets (2400 mg -3200 mg) by mouth two times daily with meals, Disp: , Rfl:   Social History   Tobacco Use  Smoking Status Never Smoker  Smokeless Tobacco Never Used    No Known Allergies Objective:  There were no vitals filed for this visit. There is no height or weight on file to calculate BMI. Constitutional Well developed. Well nourished.  Vascular Dorsalis pedis pulses palpable bilaterally. Posterior tibial pulses palpable bilaterally. Capillary refill normal to all digits.  No cyanosis or clubbing noted. Pedal hair growth normal.  Neurologic Normal speech. Oriented to person, place, and time. Epicritic sensation to light touch grossly present bilaterally.  Dermatologic Nails well groomed and normal in appearance. No skin lesions.  Left fifth MPJ with open ulceration measuring 0.5 x 0.5 post debridement Significant hyperkeratotic tissue present to the right foot about the former ulcerations.  No open ulcerations upon debridement.  Orthopedic: Normal joint ROM without pain or crepitus bilaterally. No visible deformities. 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  No pain palpation about the third fourth fifth metatarsals right foot   Radiographs: Taken reviewed.  Right fifth metatarsal appears to cold likely due to  erosion from chronic osteomyelitis.  Fourth and third metatarsal shafts with fracture noted.  Mild callus.  No navicular displacement Assessment:   1. Diabetic ulcer of right midfoot associated with diabetes mellitus due to underlying condition, limited to breakdown of skin ([place])   2. Diabetic ulcer of left midfoot associated with diabetes mellitus due to underlying condition, limited to breakdown of skin [employer])    Plan:  Patient was evaluated and treated and all questions  answered.  Chronic osteomyelitis fifth metatarsal, possible pathologic fracture third fourth metatarsal bones -We will continue to monitor.  -Repeat XR at next visit.  Ulceration left fifth MPJ -Debrided as below.  Procedure: Excisional Debridement of Wound Rationale: Removal of non-viable soft tissue from the wound to promote healing.  Anesthesia: none Pre-Debridement Wound Measurements: 0.3 cm x 0.3 cm x 0.1 cm  Post-Debridement Wound Measurements: 0.5 cm x 0.5 cm x 0.1 cm  Type of Debridement: Sharp Excisional Tissue Removed: Non-viable soft tissue Depth of Debridement: subcutaneous tissue. Technique: Sharp excisional debridement to bleeding, viable wound base.  Dressing: Dry, sterile, compression dressing. Disposition: Patient tolerated procedure well. Patient to return in 1 week for follow-up.  Ulceration plantar right forefoot -Debrided of hyperkeratotic tissue healed upon debridement -We will monitor closely. -Continue surgical shoe .-We will make appointment for fabrication diabetic shoes.    Return in about 2 weeks (around 06/24/2018).

## 2018-06-13 DIAGNOSIS — M86671 Other chronic osteomyelitis, right ankle and foot: Secondary | ICD-10-CM | POA: Diagnosis not present

## 2018-06-13 DIAGNOSIS — I120 Hypertensive chronic kidney disease with stage 5 chronic kidney disease or end stage renal disease: Secondary | ICD-10-CM | POA: Diagnosis not present

## 2018-06-13 DIAGNOSIS — D509 Iron deficiency anemia, unspecified: Secondary | ICD-10-CM | POA: Diagnosis not present

## 2018-06-13 DIAGNOSIS — A499 Bacterial infection, unspecified: Secondary | ICD-10-CM | POA: Diagnosis not present

## 2018-06-13 DIAGNOSIS — E1022 Type 1 diabetes mellitus with diabetic chronic kidney disease: Secondary | ICD-10-CM | POA: Diagnosis not present

## 2018-06-13 DIAGNOSIS — N186 End stage renal disease: Secondary | ICD-10-CM | POA: Diagnosis not present

## 2018-06-13 DIAGNOSIS — R739 Hyperglycemia, unspecified: Secondary | ICD-10-CM | POA: Diagnosis not present

## 2018-06-13 DIAGNOSIS — E10621 Type 1 diabetes mellitus with foot ulcer: Secondary | ICD-10-CM | POA: Diagnosis not present

## 2018-06-13 DIAGNOSIS — N2581 Secondary hyperparathyroidism of renal origin: Secondary | ICD-10-CM | POA: Diagnosis not present

## 2018-06-13 DIAGNOSIS — L97422 Non-pressure chronic ulcer of left heel and midfoot with fat layer exposed: Secondary | ICD-10-CM | POA: Diagnosis not present

## 2018-06-13 DIAGNOSIS — E1042 Type 1 diabetes mellitus with diabetic polyneuropathy: Secondary | ICD-10-CM | POA: Diagnosis not present

## 2018-06-14 ENCOUNTER — Encounter (HOSPITAL_BASED_OUTPATIENT_CLINIC_OR_DEPARTMENT_OTHER): Payer: Medicare Other

## 2018-06-15 DIAGNOSIS — E1022 Type 1 diabetes mellitus with diabetic chronic kidney disease: Secondary | ICD-10-CM | POA: Diagnosis not present

## 2018-06-15 DIAGNOSIS — A499 Bacterial infection, unspecified: Secondary | ICD-10-CM | POA: Diagnosis not present

## 2018-06-15 DIAGNOSIS — R739 Hyperglycemia, unspecified: Secondary | ICD-10-CM | POA: Diagnosis not present

## 2018-06-15 DIAGNOSIS — N2581 Secondary hyperparathyroidism of renal origin: Secondary | ICD-10-CM | POA: Diagnosis not present

## 2018-06-15 DIAGNOSIS — L97422 Non-pressure chronic ulcer of left heel and midfoot with fat layer exposed: Secondary | ICD-10-CM | POA: Diagnosis not present

## 2018-06-15 DIAGNOSIS — M86671 Other chronic osteomyelitis, right ankle and foot: Secondary | ICD-10-CM | POA: Diagnosis not present

## 2018-06-15 DIAGNOSIS — N186 End stage renal disease: Secondary | ICD-10-CM | POA: Diagnosis not present

## 2018-06-15 DIAGNOSIS — E1042 Type 1 diabetes mellitus with diabetic polyneuropathy: Secondary | ICD-10-CM | POA: Diagnosis not present

## 2018-06-15 DIAGNOSIS — D509 Iron deficiency anemia, unspecified: Secondary | ICD-10-CM | POA: Diagnosis not present

## 2018-06-15 DIAGNOSIS — I120 Hypertensive chronic kidney disease with stage 5 chronic kidney disease or end stage renal disease: Secondary | ICD-10-CM | POA: Diagnosis not present

## 2018-06-15 DIAGNOSIS — E10621 Type 1 diabetes mellitus with foot ulcer: Secondary | ICD-10-CM | POA: Diagnosis not present

## 2018-06-17 DIAGNOSIS — R739 Hyperglycemia, unspecified: Secondary | ICD-10-CM | POA: Diagnosis not present

## 2018-06-17 DIAGNOSIS — E1022 Type 1 diabetes mellitus with diabetic chronic kidney disease: Secondary | ICD-10-CM | POA: Diagnosis not present

## 2018-06-17 DIAGNOSIS — N2581 Secondary hyperparathyroidism of renal origin: Secondary | ICD-10-CM | POA: Diagnosis not present

## 2018-06-17 DIAGNOSIS — D509 Iron deficiency anemia, unspecified: Secondary | ICD-10-CM | POA: Diagnosis not present

## 2018-06-17 DIAGNOSIS — A499 Bacterial infection, unspecified: Secondary | ICD-10-CM | POA: Diagnosis not present

## 2018-06-17 DIAGNOSIS — N186 End stage renal disease: Secondary | ICD-10-CM | POA: Diagnosis not present

## 2018-06-20 DIAGNOSIS — N186 End stage renal disease: Secondary | ICD-10-CM | POA: Diagnosis not present

## 2018-06-20 DIAGNOSIS — L97422 Non-pressure chronic ulcer of left heel and midfoot with fat layer exposed: Secondary | ICD-10-CM | POA: Diagnosis not present

## 2018-06-20 DIAGNOSIS — E1042 Type 1 diabetes mellitus with diabetic polyneuropathy: Secondary | ICD-10-CM | POA: Diagnosis not present

## 2018-06-20 DIAGNOSIS — E1022 Type 1 diabetes mellitus with diabetic chronic kidney disease: Secondary | ICD-10-CM | POA: Diagnosis not present

## 2018-06-20 DIAGNOSIS — D509 Iron deficiency anemia, unspecified: Secondary | ICD-10-CM | POA: Diagnosis not present

## 2018-06-20 DIAGNOSIS — N2581 Secondary hyperparathyroidism of renal origin: Secondary | ICD-10-CM | POA: Diagnosis not present

## 2018-06-20 DIAGNOSIS — R739 Hyperglycemia, unspecified: Secondary | ICD-10-CM | POA: Diagnosis not present

## 2018-06-20 DIAGNOSIS — A499 Bacterial infection, unspecified: Secondary | ICD-10-CM | POA: Diagnosis not present

## 2018-06-20 DIAGNOSIS — E10621 Type 1 diabetes mellitus with foot ulcer: Secondary | ICD-10-CM | POA: Diagnosis not present

## 2018-06-20 DIAGNOSIS — I120 Hypertensive chronic kidney disease with stage 5 chronic kidney disease or end stage renal disease: Secondary | ICD-10-CM | POA: Diagnosis not present

## 2018-06-20 DIAGNOSIS — M86671 Other chronic osteomyelitis, right ankle and foot: Secondary | ICD-10-CM | POA: Diagnosis not present

## 2018-06-22 DIAGNOSIS — R739 Hyperglycemia, unspecified: Secondary | ICD-10-CM | POA: Diagnosis not present

## 2018-06-22 DIAGNOSIS — N2581 Secondary hyperparathyroidism of renal origin: Secondary | ICD-10-CM | POA: Diagnosis not present

## 2018-06-22 DIAGNOSIS — D509 Iron deficiency anemia, unspecified: Secondary | ICD-10-CM | POA: Diagnosis not present

## 2018-06-22 DIAGNOSIS — A499 Bacterial infection, unspecified: Secondary | ICD-10-CM | POA: Diagnosis not present

## 2018-06-22 DIAGNOSIS — E1022 Type 1 diabetes mellitus with diabetic chronic kidney disease: Secondary | ICD-10-CM | POA: Diagnosis not present

## 2018-06-22 DIAGNOSIS — N186 End stage renal disease: Secondary | ICD-10-CM | POA: Diagnosis not present

## 2018-06-23 ENCOUNTER — Ambulatory Visit (INDEPENDENT_AMBULATORY_CARE_PROVIDER_SITE_OTHER): Payer: Medicare Other

## 2018-06-23 ENCOUNTER — Ambulatory Visit: Payer: Medicare Other | Admitting: Orthotics

## 2018-06-23 ENCOUNTER — Ambulatory Visit (INDEPENDENT_AMBULATORY_CARE_PROVIDER_SITE_OTHER): Payer: Medicare Other | Admitting: Podiatry

## 2018-06-23 DIAGNOSIS — E1022 Type 1 diabetes mellitus with diabetic chronic kidney disease: Secondary | ICD-10-CM

## 2018-06-23 DIAGNOSIS — Z89421 Acquired absence of other right toe(s): Secondary | ICD-10-CM

## 2018-06-23 DIAGNOSIS — L97411 Non-pressure chronic ulcer of right heel and midfoot limited to breakdown of skin: Secondary | ICD-10-CM

## 2018-06-23 DIAGNOSIS — Z992 Dependence on renal dialysis: Secondary | ICD-10-CM

## 2018-06-23 DIAGNOSIS — I120 Hypertensive chronic kidney disease with stage 5 chronic kidney disease or end stage renal disease: Secondary | ICD-10-CM

## 2018-06-23 DIAGNOSIS — N186 End stage renal disease: Secondary | ICD-10-CM

## 2018-06-23 DIAGNOSIS — Z91199 Patient's noncompliance with other medical treatment and regimen due to unspecified reason: Secondary | ICD-10-CM

## 2018-06-23 DIAGNOSIS — L97421 Non-pressure chronic ulcer of left heel and midfoot limited to breakdown of skin: Secondary | ICD-10-CM

## 2018-06-23 DIAGNOSIS — Z9119 Patient's noncompliance with other medical treatment and regimen: Secondary | ICD-10-CM | POA: Diagnosis not present

## 2018-06-23 DIAGNOSIS — E08621 Diabetes mellitus due to underlying condition with foot ulcer: Secondary | ICD-10-CM

## 2018-06-23 DIAGNOSIS — E875 Hyperkalemia: Secondary | ICD-10-CM

## 2018-06-23 DIAGNOSIS — I739 Peripheral vascular disease, unspecified: Secondary | ICD-10-CM

## 2018-06-23 MED ORDER — CLINDAMYCIN HCL 150 MG PO CAPS: 150.0000 mg | ORAL_CAPSULE | Freq: Two times a day (BID) | ORAL | 0 refills | Status: DC

## 2018-06-23 NOTE — Progress Notes (Signed)

## 2018-06-23 NOTE — Progress Notes (Addendum)
Subjective:  Patient ID: [name], male    DOB: [birth year],  MRN: [id number]  Chief Complaint  Patient presents with  . Diabetic Ulcer    follow up bilateral    37 y.o. male presents with the above complaint.  Face both feet are looking better at home health did not come yesterday but otherwise has been coming.  Review of Systems: Negative except as noted in the HPI. Denies N/V/F/Ch.  Past Medical History:  Diagnosis Date  . Diabetes mellitus without complication ([employer])   . Diabetic gastroparesis ([place])   . Dialysis patient ([place])   . Hypertension   . Renal disorder    Dialysis  . Sepsis ([place])     Current Outpatient Medications:  .  acetaminophen (TYLENOL) 325 MG tablet, Take 2 tablets (650 mg total) by mouth every 6 (six) hours as needed for mild pain or headache (pain/headache). Can restart this after you are done with Oxycodone/Tylenol, Disp: , Rfl:  .  amLODipine (NORVASC) 10 MG tablet, Take 10 mg by mouth daily., Disp: , Rfl:  .  cinacalcet (SENSIPAR) 30 MG tablet, Take 30 mg by mouth daily with supper., Disp: , Rfl:  .  clindamycin (CLEOCIN) 150 MG capsule, Take 1 capsule (150 mg total) by mouth 2 (two) times daily., Disp: 14 capsule, Rfl: 0 .  clonazePAM (KLONOPIN) 0.5 MG tablet, Take 0.5 mg by mouth daily as needed for anxiety (sleep). , Disp: , Rfl:  .  collagenase (SANTYL) ointment, Apply 1 application topically daily., Disp: 15 g, Rfl: 0 .  insulin aspart (NOVOLOG) 100 UNIT/ML injection, Inject 5 Units into the skin 2 (two) times daily with a meal. , Disp: , Rfl:  .  insulin glargine (LANTUS) 100 UNIT/ML injection, Inject 0.1 mLs (10 Units total) into the skin at bedtime., Disp: 10 mL, Rfl: 1 .  losartan (COZAAR) 50 MG tablet, Take 50 mg by mouth daily., Disp: , Rfl:  .  ondansetron (ZOFRAN ODT) 4 MG disintegrating tablet, Take 1 tablet (4 mg total) by mouth every 8 (eight) hours as needed for nausea or vomiting., Disp: 10 tablet, Rfl: 0 .  oxyCODONE-acetaminophen  (PERCOCET/ROXICET) 5-325 MG tablet, Take 1 tablet by mouth every 6 (six) hours as needed., Disp: 10 tablet, Rfl: 0 .  sevelamer carbonate (RENVELA) 800 MG tablet, Take 2,400-3,200 mg by mouth See admin instructions. Take 3 - 4 tablets (2400 mg -3200 mg) by mouth two times daily with meals, Disp: , Rfl:   Social History   Tobacco Use  Smoking Status Never Smoker  Smokeless Tobacco Never Used    No Known Allergies Objective:  There were no vitals filed for this visit. There is no height or weight on file to calculate BMI. Constitutional Well developed. Well nourished.  Vascular Dorsalis pedis pulses palpable bilaterally. Posterior tibial pulses palpable bilaterally. Capillary refill normal to all digits.  No cyanosis or clubbing noted. Pedal hair growth normal.  Neurologic Normal speech. Oriented to person, place, and time. Epicritic sensation to light touch grossly present bilaterally.  Dermatologic Nails well groomed and normal in appearance. No skin lesions. Right plantar foot ulceration with macerated epithelium noted upon debridement  Left fifth MPJ with open ulceration measuring 3 x 3 post debridement with deep probing but not joint capsule and bone.  Orthopedic: Normal joint ROM without pain or crepitus bilaterally. No visible deformities. 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  No pain palpation about the third fourth fifth metatarsals right foot  Radiographs: Taken reviewed no acute fractures or erosions noted to the left foot.  Assessment:   1. Diabetic ulcer of left midfoot associated with diabetes mellitus due to underlying condition, limited to breakdown of skin [facility])    Plan:  Patient was evaluated and treated and all questions answered.  Ulceration left fifth MPJ -Debrided as below. -Continue surgical shoe.  Patient has not been wearing the shoe. -Discussed with patient that continued noncompliance could lead to  worsening ulceration to the point where he needs to have them excised. -Dressed with Betadine and DSD.  Procedure: Excisional Debridement of Wound Rationale: Removal of non-viable soft tissue from the wound to promote healing.  Anesthesia: none Pre-Debridement Wound Measurements: overlying hyperkeratosis  Post-Debridement Wound Measurements: 3 cm x 3 cm x 0.2 cm  Type of Debridement: Sharp Excisional Tissue Removed: Non-viable soft tissue Depth of Debridement: subcutaneous tissue. Technique: Sharp excisional debridement to bleeding, viable wound base.  Dressing: Dry, sterile, compression dressing. Disposition: Patient tolerated procedure well. Patient to return in 1 week for follow-up.   Ulceration plantar right forefoot -Debrided of hyperkeratotic tissue.  Macerated epithelium noted upon debridement -We will monitor closely. -Continue surgical shoe -Casted for diabetic shoes today  Return in about 1 week (around 06/30/2018) for Wound Care, Left.

## 2018-06-24 ENCOUNTER — Ambulatory Visit: Payer: Medicare Other | Admitting: Podiatry

## 2018-06-24 DIAGNOSIS — I871 Compression of vein: Secondary | ICD-10-CM | POA: Diagnosis not present

## 2018-06-24 DIAGNOSIS — N186 End stage renal disease: Secondary | ICD-10-CM | POA: Diagnosis not present

## 2018-06-24 DIAGNOSIS — T82868A Thrombosis of vascular prosthetic devices, implants and grafts, initial encounter: Secondary | ICD-10-CM | POA: Diagnosis not present

## 2018-06-24 DIAGNOSIS — Z992 Dependence on renal dialysis: Secondary | ICD-10-CM | POA: Diagnosis not present

## 2018-06-25 DIAGNOSIS — A499 Bacterial infection, unspecified: Secondary | ICD-10-CM | POA: Diagnosis not present

## 2018-06-25 DIAGNOSIS — R739 Hyperglycemia, unspecified: Secondary | ICD-10-CM | POA: Diagnosis not present

## 2018-06-25 DIAGNOSIS — N186 End stage renal disease: Secondary | ICD-10-CM | POA: Diagnosis not present

## 2018-06-25 DIAGNOSIS — N2581 Secondary hyperparathyroidism of renal origin: Secondary | ICD-10-CM | POA: Diagnosis not present

## 2018-06-25 DIAGNOSIS — E1022 Type 1 diabetes mellitus with diabetic chronic kidney disease: Secondary | ICD-10-CM | POA: Diagnosis not present

## 2018-06-25 DIAGNOSIS — D509 Iron deficiency anemia, unspecified: Secondary | ICD-10-CM | POA: Diagnosis not present

## 2018-06-27 DIAGNOSIS — E1022 Type 1 diabetes mellitus with diabetic chronic kidney disease: Secondary | ICD-10-CM | POA: Diagnosis not present

## 2018-06-27 DIAGNOSIS — L97422 Non-pressure chronic ulcer of left heel and midfoot with fat layer exposed: Secondary | ICD-10-CM | POA: Diagnosis not present

## 2018-06-27 DIAGNOSIS — N2581 Secondary hyperparathyroidism of renal origin: Secondary | ICD-10-CM | POA: Diagnosis not present

## 2018-06-27 DIAGNOSIS — E1042 Type 1 diabetes mellitus with diabetic polyneuropathy: Secondary | ICD-10-CM | POA: Diagnosis not present

## 2018-06-27 DIAGNOSIS — I120 Hypertensive chronic kidney disease with stage 5 chronic kidney disease or end stage renal disease: Secondary | ICD-10-CM | POA: Diagnosis not present

## 2018-06-27 DIAGNOSIS — D509 Iron deficiency anemia, unspecified: Secondary | ICD-10-CM | POA: Diagnosis not present

## 2018-06-27 DIAGNOSIS — A499 Bacterial infection, unspecified: Secondary | ICD-10-CM | POA: Diagnosis not present

## 2018-06-27 DIAGNOSIS — E10621 Type 1 diabetes mellitus with foot ulcer: Secondary | ICD-10-CM | POA: Diagnosis not present

## 2018-06-27 DIAGNOSIS — M86671 Other chronic osteomyelitis, right ankle and foot: Secondary | ICD-10-CM | POA: Diagnosis not present

## 2018-06-27 DIAGNOSIS — N186 End stage renal disease: Secondary | ICD-10-CM | POA: Diagnosis not present

## 2018-06-27 DIAGNOSIS — R739 Hyperglycemia, unspecified: Secondary | ICD-10-CM | POA: Diagnosis not present

## 2018-06-28 DIAGNOSIS — I129 Hypertensive chronic kidney disease with stage 1 through stage 4 chronic kidney disease, or unspecified chronic kidney disease: Secondary | ICD-10-CM | POA: Diagnosis not present

## 2018-06-28 DIAGNOSIS — N186 End stage renal disease: Secondary | ICD-10-CM | POA: Diagnosis not present

## 2018-06-28 DIAGNOSIS — Z992 Dependence on renal dialysis: Secondary | ICD-10-CM | POA: Diagnosis not present

## 2018-06-29 DIAGNOSIS — E1022 Type 1 diabetes mellitus with diabetic chronic kidney disease: Secondary | ICD-10-CM | POA: Diagnosis not present

## 2018-06-29 DIAGNOSIS — E10621 Type 1 diabetes mellitus with foot ulcer: Secondary | ICD-10-CM | POA: Diagnosis not present

## 2018-06-29 DIAGNOSIS — Z23 Encounter for immunization: Secondary | ICD-10-CM | POA: Insufficient documentation

## 2018-06-29 DIAGNOSIS — I120 Hypertensive chronic kidney disease with stage 5 chronic kidney disease or end stage renal disease: Secondary | ICD-10-CM | POA: Diagnosis not present

## 2018-06-29 DIAGNOSIS — E1042 Type 1 diabetes mellitus with diabetic polyneuropathy: Secondary | ICD-10-CM | POA: Diagnosis not present

## 2018-06-29 DIAGNOSIS — L97422 Non-pressure chronic ulcer of left heel and midfoot with fat layer exposed: Secondary | ICD-10-CM | POA: Diagnosis not present

## 2018-06-29 DIAGNOSIS — N186 End stage renal disease: Secondary | ICD-10-CM | POA: Diagnosis not present

## 2018-06-29 DIAGNOSIS — D631 Anemia in chronic kidney disease: Secondary | ICD-10-CM | POA: Diagnosis not present

## 2018-06-29 DIAGNOSIS — D509 Iron deficiency anemia, unspecified: Secondary | ICD-10-CM | POA: Diagnosis not present

## 2018-06-29 DIAGNOSIS — M86671 Other chronic osteomyelitis, right ankle and foot: Secondary | ICD-10-CM | POA: Diagnosis not present

## 2018-06-29 DIAGNOSIS — R739 Hyperglycemia, unspecified: Secondary | ICD-10-CM | POA: Diagnosis not present

## 2018-06-29 DIAGNOSIS — N2581 Secondary hyperparathyroidism of renal origin: Secondary | ICD-10-CM | POA: Diagnosis not present

## 2018-06-30 ENCOUNTER — Ambulatory Visit (INDEPENDENT_AMBULATORY_CARE_PROVIDER_SITE_OTHER): Payer: Medicare Other | Admitting: Podiatry

## 2018-06-30 DIAGNOSIS — L97421 Non-pressure chronic ulcer of left heel and midfoot limited to breakdown of skin: Secondary | ICD-10-CM

## 2018-06-30 DIAGNOSIS — L97411 Non-pressure chronic ulcer of right heel and midfoot limited to breakdown of skin: Secondary | ICD-10-CM | POA: Diagnosis not present

## 2018-06-30 DIAGNOSIS — E08621 Diabetes mellitus due to underlying condition with foot ulcer: Secondary | ICD-10-CM

## 2018-06-30 NOTE — Progress Notes (Signed)
Subjective:  Patient ID: [name], male    DOB: 1981/05/26,  MRN: [id number]  Chief Complaint  Patient presents with  . Diabetic Ulcer    bilateral - right foot still looks great, left foot getting better    37 y.o. male presents with the above complaint. States his feet are feeling much better. Wearing surgical shoes to both feet.  Review of Systems: Negative except as noted in the HPI. Denies N/V/F/Ch.  Past Medical History:  Diagnosis Date  . Diabetes mellitus without complication ([place])   . Diabetic gastroparesis ([place])   . Dialysis patient ([place])   . Hypertension   . Renal disorder    Dialysis  . Sepsis ([place])     Current Outpatient Medications:  .  acetaminophen (TYLENOL) 325 MG tablet, Take 2 tablets (650 mg total) by mouth every 6 (six) hours as needed for mild pain or headache (pain/headache). Can restart this after you are done with Oxycodone/Tylenol, Disp: , Rfl:  .  amLODipine (NORVASC) 10 MG tablet, Take 10 mg by mouth daily., Disp: , Rfl:  .  cinacalcet (SENSIPAR) 30 MG tablet, Take 30 mg by mouth daily with supper., Disp: , Rfl:  .  clindamycin (CLEOCIN) 150 MG capsule, Take 1 capsule (150 mg total) by mouth 2 (two) times daily., Disp: 14 capsule, Rfl: 0 .  clonazePAM (KLONOPIN) 0.5 MG tablet, Take 0.5 mg by mouth daily as needed for anxiety (sleep). , Disp: , Rfl:  .  collagenase (SANTYL) ointment, Apply 1 application topically daily., Disp: 15 g, Rfl: 0 .  insulin aspart (NOVOLOG) 100 UNIT/ML injection, Inject 5 Units into the skin 2 (two) times daily with a meal. , Disp: , Rfl:  .  insulin glargine (LANTUS) 100 UNIT/ML injection, Inject 0.1 mLs (10 Units total) into the skin at bedtime., Disp: 10 mL, Rfl: 1 .  losartan (COZAAR) 50 MG tablet, Take 50 mg by mouth daily., Disp: , Rfl:  .  ondansetron (ZOFRAN ODT) 4 MG disintegrating tablet, Take 1 tablet (4 mg total) by mouth every 8 (eight) hours as needed for nausea or vomiting., Disp: 10 tablet, Rfl: 0 .   oxyCODONE-acetaminophen (PERCOCET/ROXICET) 5-325 MG tablet, Take 1 tablet by mouth every 6 (six) hours as needed., Disp: 10 tablet, Rfl: 0 .  sevelamer carbonate (RENVELA) 800 MG tablet, Take 2,400-3,200 mg by mouth See admin instructions. Take 3 - 4 tablets (2400 mg -3200 mg) by mouth two times daily with meals, Disp: , Rfl:   Social History   Tobacco Use  Smoking Status Never Smoker  Smokeless Tobacco Never Used    No Known Allergies Objective:  There were no vitals filed for this visit. There is no height or weight on file to calculate BMI. Constitutional Well developed. Well nourished.  Vascular Dorsalis pedis pulses palpable bilaterally. Posterior tibial pulses palpable bilaterally. Capillary refill normal to all digits.  No cyanosis or clubbing noted. Pedal hair growth normal.  Neurologic Normal speech. Oriented to person, place, and time. Epicritic sensation to light touch grossly present bilaterally.  Dermatologic Nails well groomed and normal in appearance. No skin lesions. Right plantar foot ulceration with macerated epithelium noted upon debridement  Left fifth MPJ with open ulceration measuring 3 x 3 post debridement with deep probing but not joint capsule and bone.  Orthopedic: Normal joint ROM without pain or crepitus bilaterally. No visible deformities. 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  No pain palpation about the third fourth fifth metatarsals right  foot   Radiographs: None today.  Assessment:   1. Diabetic ulcer of left midfoot associated with diabetes mellitus due to underlying condition, limited to breakdown of skin ([place])   2. Diabetic ulcer of right midfoot associated with diabetes mellitus due to underlying condition, limited to breakdown of skin [facility])    Plan:  Patient was evaluated and treated and all questions answered.  Ulceration left fifth MPJ -Debrided as below. -Continue surgical shoe.  -Dressed  with medihoney and DSD.  Procedure: Excisional Debridement of Wound Rationale: Removal of non-viable soft tissue from the wound to promote healing.  Anesthesia: none Pre-Debridement Wound Measurements: 2 cm x 2 cm x 0.2 cm  Post-Debridement Wound Measurements: 2.5 cm x 2.5 cm x 0.2 cm  Type of Debridement: Sharp Excisional Tissue Removed: Non-viable soft tissue Depth of Debridement: subcutaneous tissue. Technique: Sharp excisional debridement to bleeding, viable wound base.  Dressing: Dry, sterile, compression dressing. Disposition: Patient tolerated procedure well. Patient to return in 1 week for follow-up.   Ulceration plantar right forefoot -Healed. No debridement today. -Continue surgical shoe until we get his DM shoes.  Return in about 1 week (around 07/07/2018).

## 2018-07-01 DIAGNOSIS — E1022 Type 1 diabetes mellitus with diabetic chronic kidney disease: Secondary | ICD-10-CM | POA: Diagnosis not present

## 2018-07-01 DIAGNOSIS — D509 Iron deficiency anemia, unspecified: Secondary | ICD-10-CM | POA: Diagnosis not present

## 2018-07-01 DIAGNOSIS — N186 End stage renal disease: Secondary | ICD-10-CM | POA: Diagnosis not present

## 2018-07-01 DIAGNOSIS — R739 Hyperglycemia, unspecified: Secondary | ICD-10-CM | POA: Diagnosis not present

## 2018-07-01 DIAGNOSIS — Z23 Encounter for immunization: Secondary | ICD-10-CM | POA: Diagnosis not present

## 2018-07-01 DIAGNOSIS — N2581 Secondary hyperparathyroidism of renal origin: Secondary | ICD-10-CM | POA: Diagnosis not present

## 2018-07-04 DIAGNOSIS — I120 Hypertensive chronic kidney disease with stage 5 chronic kidney disease or end stage renal disease: Secondary | ICD-10-CM | POA: Diagnosis not present

## 2018-07-04 DIAGNOSIS — E1022 Type 1 diabetes mellitus with diabetic chronic kidney disease: Secondary | ICD-10-CM | POA: Diagnosis not present

## 2018-07-04 DIAGNOSIS — L97422 Non-pressure chronic ulcer of left heel and midfoot with fat layer exposed: Secondary | ICD-10-CM | POA: Diagnosis not present

## 2018-07-04 DIAGNOSIS — E1042 Type 1 diabetes mellitus with diabetic polyneuropathy: Secondary | ICD-10-CM | POA: Diagnosis not present

## 2018-07-04 DIAGNOSIS — Z23 Encounter for immunization: Secondary | ICD-10-CM | POA: Diagnosis not present

## 2018-07-04 DIAGNOSIS — E10621 Type 1 diabetes mellitus with foot ulcer: Secondary | ICD-10-CM | POA: Diagnosis not present

## 2018-07-04 DIAGNOSIS — N2581 Secondary hyperparathyroidism of renal origin: Secondary | ICD-10-CM | POA: Diagnosis not present

## 2018-07-04 DIAGNOSIS — N186 End stage renal disease: Secondary | ICD-10-CM | POA: Diagnosis not present

## 2018-07-04 DIAGNOSIS — R739 Hyperglycemia, unspecified: Secondary | ICD-10-CM | POA: Diagnosis not present

## 2018-07-04 DIAGNOSIS — M86671 Other chronic osteomyelitis, right ankle and foot: Secondary | ICD-10-CM | POA: Diagnosis not present

## 2018-07-04 DIAGNOSIS — D509 Iron deficiency anemia, unspecified: Secondary | ICD-10-CM | POA: Diagnosis not present

## 2018-07-06 DIAGNOSIS — Z23 Encounter for immunization: Secondary | ICD-10-CM | POA: Diagnosis not present

## 2018-07-06 DIAGNOSIS — R739 Hyperglycemia, unspecified: Secondary | ICD-10-CM | POA: Diagnosis not present

## 2018-07-06 DIAGNOSIS — N186 End stage renal disease: Secondary | ICD-10-CM | POA: Diagnosis not present

## 2018-07-06 DIAGNOSIS — N2581 Secondary hyperparathyroidism of renal origin: Secondary | ICD-10-CM | POA: Diagnosis not present

## 2018-07-06 DIAGNOSIS — E1022 Type 1 diabetes mellitus with diabetic chronic kidney disease: Secondary | ICD-10-CM | POA: Diagnosis not present

## 2018-07-06 DIAGNOSIS — D509 Iron deficiency anemia, unspecified: Secondary | ICD-10-CM | POA: Diagnosis not present

## 2018-07-06 DIAGNOSIS — E1129 Type 2 diabetes mellitus with other diabetic kidney complication: Secondary | ICD-10-CM | POA: Diagnosis not present

## 2018-07-07 ENCOUNTER — Telehealth: Payer: Self-pay | Admitting: *Deleted

## 2018-07-07 ENCOUNTER — Ambulatory Visit (INDEPENDENT_AMBULATORY_CARE_PROVIDER_SITE_OTHER): Payer: Medicare Other | Admitting: Podiatry

## 2018-07-07 DIAGNOSIS — L97421 Non-pressure chronic ulcer of left heel and midfoot limited to breakdown of skin: Secondary | ICD-10-CM

## 2018-07-07 DIAGNOSIS — E08621 Diabetes mellitus due to underlying condition with foot ulcer: Secondary | ICD-10-CM | POA: Diagnosis not present

## 2018-07-07 MED ORDER — COLLAGENASE 250 UNIT/GM EX OINT: 1.0000 "application " | TOPICAL_OINTMENT | Freq: Every day | CUTANEOUS | 5 refills | Status: DC

## 2018-07-07 NOTE — Telephone Encounter (Signed)
-----   Message from Evelina Bucy, DPM sent at 07/07/2018  8:43 AM EDT ----- Can we order more Santyl?

## 2018-07-07 NOTE — Telephone Encounter (Signed)
Faxed required form, demographics to [facility].

## 2018-07-08 DIAGNOSIS — M86671 Other chronic osteomyelitis, right ankle and foot: Secondary | ICD-10-CM | POA: Diagnosis not present

## 2018-07-08 DIAGNOSIS — E10621 Type 1 diabetes mellitus with foot ulcer: Secondary | ICD-10-CM | POA: Diagnosis not present

## 2018-07-08 DIAGNOSIS — Z23 Encounter for immunization: Secondary | ICD-10-CM | POA: Diagnosis not present

## 2018-07-08 DIAGNOSIS — E1042 Type 1 diabetes mellitus with diabetic polyneuropathy: Secondary | ICD-10-CM | POA: Diagnosis not present

## 2018-07-08 DIAGNOSIS — N186 End stage renal disease: Secondary | ICD-10-CM | POA: Diagnosis not present

## 2018-07-08 DIAGNOSIS — R739 Hyperglycemia, unspecified: Secondary | ICD-10-CM | POA: Diagnosis not present

## 2018-07-08 DIAGNOSIS — L97422 Non-pressure chronic ulcer of left heel and midfoot with fat layer exposed: Secondary | ICD-10-CM | POA: Diagnosis not present

## 2018-07-08 DIAGNOSIS — D509 Iron deficiency anemia, unspecified: Secondary | ICD-10-CM | POA: Diagnosis not present

## 2018-07-08 DIAGNOSIS — I120 Hypertensive chronic kidney disease with stage 5 chronic kidney disease or end stage renal disease: Secondary | ICD-10-CM | POA: Diagnosis not present

## 2018-07-08 DIAGNOSIS — E1022 Type 1 diabetes mellitus with diabetic chronic kidney disease: Secondary | ICD-10-CM | POA: Diagnosis not present

## 2018-07-08 DIAGNOSIS — N2581 Secondary hyperparathyroidism of renal origin: Secondary | ICD-10-CM | POA: Diagnosis not present

## 2018-07-08 NOTE — Progress Notes (Signed)
Subjective:  Patient ID: [name], male    DOB: 03/22/1981,  MRN: [id number]  Chief Complaint  Patient presents with  . Wound Check    Pt returns for wound check on plantar ulcer underneath 5th metatarsal. Pt states wound is the same since last visit.    37 y.o. male presents with the above complaint.  Thinks the wound is doing the same.  Not sure what home health is putting on it but he states that he ran out of the [place] medication.  Review of Systems: Negative except as noted in the HPI. Denies N/V/F/Ch.  Past Medical History:  Diagnosis Date  . Diabetes mellitus without complication ([place])   . Diabetic gastroparesis ([place])   . Dialysis patient ([place])   . Hypertension   . Renal disorder    Dialysis  . Sepsis ([place])     Current Outpatient Medications:  .  cloNIDine (CATAPRES - DOSED IN MG/24 HR) 0.3 mg/24hr patch, APPLY ONE PATCH TOPICALLY ONCE A WEEK, Disp: , Rfl:  .  glucose blood ([employer] TEST STRIPS) test strip, by Other route 4 (four) times a day as needed., Disp: , Rfl:  .  metoCLOPramide (REGLAN) 5 MG tablet, TAKE ONE TABLET BY MOUTH THREE TIMES DAILY BEFORE MEAL(S), Disp: , Rfl:  .  acetaminophen (TYLENOL) 325 MG tablet, Take 2 tablets (650 mg total) by mouth every 6 (six) hours as needed for mild pain or headache (pain/headache). Can restart this after you are done with Oxycodone/Tylenol, Disp: , Rfl:  .  amLODipine (NORVASC) 10 MG tablet, Take 10 mg by mouth daily., Disp: , Rfl:  .  cinacalcet (SENSIPAR) 30 MG tablet, Take 30 mg by mouth daily with supper., Disp: , Rfl:  .  clindamycin (CLEOCIN) 150 MG capsule, Take 1 capsule (150 mg total) by mouth 2 (two) times daily., Disp: 14 capsule, Rfl: 0 .  clonazePAM (KLONOPIN) 0.5 MG tablet, Take 0.5 mg by mouth daily as needed for anxiety (sleep). , Disp: , Rfl:  .  collagenase (SANTYL) ointment, Apply 1 application topically daily., Disp: 15 g, Rfl: 5 .  insulin aspart (NOVOLOG) 100 UNIT/ML injection, Inject 5 Units into  the skin 2 (two) times daily with a meal. , Disp: , Rfl:  .  insulin glargine (LANTUS) 100 UNIT/ML injection, Inject 0.1 mLs (10 Units total) into the skin at bedtime., Disp: 10 mL, Rfl: 1 .  losartan (COZAAR) 50 MG tablet, Take 50 mg by mouth daily., Disp: , Rfl:  .  ondansetron (ZOFRAN ODT) 4 MG disintegrating tablet, Take 1 tablet (4 mg total) by mouth every 8 (eight) hours as needed for nausea or vomiting., Disp: 10 tablet, Rfl: 0 .  oxyCODONE-acetaminophen (PERCOCET/ROXICET) 5-325 MG tablet, Take 1 tablet by mouth every 6 (six) hours as needed., Disp: 10 tablet, Rfl: 0 .  sevelamer carbonate (RENVELA) 800 MG tablet, Take 2,400-3,200 mg by mouth See admin instructions. Take 3 - 4 tablets (2400 mg -3200 mg) by mouth two times daily with meals, Disp: , Rfl:   Social History   Tobacco Use  Smoking Status Never Smoker  Smokeless Tobacco Never Used    No Known Allergies Objective:  There were no vitals filed for this visit. There is no height or weight on file to calculate BMI. Constitutional Well developed. Well nourished.  Vascular Dorsalis pedis pulses palpable bilaterally. Posterior tibial pulses palpable bilaterally. Capillary refill normal to all digits.  No cyanosis or clubbing noted. Pedal hair growth normal.  Neurologic Normal speech. Oriented  to person, place, and time. Epicritic sensation to light touch grossly present bilaterally.  Dermatologic Nails well groomed and normal in appearance. No skin lesions. Right plantar foot ulceration with macerated epithelium noted upon debridement  Left fifth MPJ with open ulceration 3 x 3 upon debridement without capsule or bone exposure.  Orthopedic: Normal joint ROM without pain or crepitus bilaterally. No visible deformities. 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  No pain palpation about the third fourth fifth metatarsals right foot   Radiographs: None today.  Assessment:   1. Diabetic  ulcer of left midfoot associated with diabetes mellitus due to underlying condition, limited to breakdown of skin [facility])    Plan:  Patient was evaluated and treated and all questions answered.  Ulceration left fifth MPJ -Debrided as below -Rx Santyl to be used by home health as part of Santyl wet-to-dry dressing  Procedure: Excisional Debridement of Wound Rationale: Removal of non-viable soft tissue from the wound to promote healing.  Anesthesia: none Pre-Debridement Wound Measurements: 2.5 cm x 2.5 cm x 0.5 cm  Post-Debridement Wound Measurements: 3 cm x 3 cm x 0.5 cm  Type of Debridement: Sharp Excisional Tissue Removed: Non-viable soft tissue Depth of Debridement: subcutaneous tissue. Technique: Sharp excisional debridement to bleeding, viable wound base.  Dressing: Dry, sterile, compression dressing. Disposition: Patient tolerated procedure well. Patient to return in 1 week for follow-up.  Ulceration plantar right forefoot -Healed. No debridement today. -Continue surgical shoe until we get his DM shoes.  No follow-ups on file.

## 2018-07-11 DIAGNOSIS — E1022 Type 1 diabetes mellitus with diabetic chronic kidney disease: Secondary | ICD-10-CM | POA: Diagnosis not present

## 2018-07-11 DIAGNOSIS — R739 Hyperglycemia, unspecified: Secondary | ICD-10-CM | POA: Diagnosis not present

## 2018-07-11 DIAGNOSIS — N186 End stage renal disease: Secondary | ICD-10-CM | POA: Diagnosis not present

## 2018-07-11 DIAGNOSIS — E1042 Type 1 diabetes mellitus with diabetic polyneuropathy: Secondary | ICD-10-CM | POA: Diagnosis not present

## 2018-07-11 DIAGNOSIS — E10621 Type 1 diabetes mellitus with foot ulcer: Secondary | ICD-10-CM | POA: Diagnosis not present

## 2018-07-11 DIAGNOSIS — I120 Hypertensive chronic kidney disease with stage 5 chronic kidney disease or end stage renal disease: Secondary | ICD-10-CM | POA: Diagnosis not present

## 2018-07-11 DIAGNOSIS — L97422 Non-pressure chronic ulcer of left heel and midfoot with fat layer exposed: Secondary | ICD-10-CM | POA: Diagnosis not present

## 2018-07-11 DIAGNOSIS — Z23 Encounter for immunization: Secondary | ICD-10-CM | POA: Diagnosis not present

## 2018-07-11 DIAGNOSIS — N2581 Secondary hyperparathyroidism of renal origin: Secondary | ICD-10-CM | POA: Diagnosis not present

## 2018-07-11 DIAGNOSIS — M86671 Other chronic osteomyelitis, right ankle and foot: Secondary | ICD-10-CM | POA: Diagnosis not present

## 2018-07-11 DIAGNOSIS — D509 Iron deficiency anemia, unspecified: Secondary | ICD-10-CM | POA: Diagnosis not present

## 2018-07-13 DIAGNOSIS — Z23 Encounter for immunization: Secondary | ICD-10-CM | POA: Diagnosis not present

## 2018-07-13 DIAGNOSIS — N186 End stage renal disease: Secondary | ICD-10-CM | POA: Diagnosis not present

## 2018-07-13 DIAGNOSIS — R739 Hyperglycemia, unspecified: Secondary | ICD-10-CM | POA: Diagnosis not present

## 2018-07-13 DIAGNOSIS — D509 Iron deficiency anemia, unspecified: Secondary | ICD-10-CM | POA: Diagnosis not present

## 2018-07-13 DIAGNOSIS — E1022 Type 1 diabetes mellitus with diabetic chronic kidney disease: Secondary | ICD-10-CM | POA: Diagnosis not present

## 2018-07-13 DIAGNOSIS — N2581 Secondary hyperparathyroidism of renal origin: Secondary | ICD-10-CM | POA: Diagnosis not present

## 2018-07-14 ENCOUNTER — Ambulatory Visit (INDEPENDENT_AMBULATORY_CARE_PROVIDER_SITE_OTHER): Payer: Medicare Other | Admitting: Podiatry

## 2018-07-14 ENCOUNTER — Encounter: Payer: Self-pay | Admitting: Podiatry

## 2018-07-14 DIAGNOSIS — L97421 Non-pressure chronic ulcer of left heel and midfoot limited to breakdown of skin: Secondary | ICD-10-CM | POA: Diagnosis not present

## 2018-07-14 DIAGNOSIS — E08621 Diabetes mellitus due to underlying condition with foot ulcer: Secondary | ICD-10-CM

## 2018-07-15 ENCOUNTER — Telehealth: Payer: Self-pay | Admitting: *Deleted

## 2018-07-15 DIAGNOSIS — N2581 Secondary hyperparathyroidism of renal origin: Secondary | ICD-10-CM | POA: Diagnosis not present

## 2018-07-15 DIAGNOSIS — E1022 Type 1 diabetes mellitus with diabetic chronic kidney disease: Secondary | ICD-10-CM | POA: Diagnosis not present

## 2018-07-15 DIAGNOSIS — N186 End stage renal disease: Secondary | ICD-10-CM | POA: Diagnosis not present

## 2018-07-15 DIAGNOSIS — E10621 Type 1 diabetes mellitus with foot ulcer: Secondary | ICD-10-CM | POA: Diagnosis not present

## 2018-07-15 DIAGNOSIS — I120 Hypertensive chronic kidney disease with stage 5 chronic kidney disease or end stage renal disease: Secondary | ICD-10-CM | POA: Diagnosis not present

## 2018-07-15 DIAGNOSIS — E1042 Type 1 diabetes mellitus with diabetic polyneuropathy: Secondary | ICD-10-CM | POA: Diagnosis not present

## 2018-07-15 DIAGNOSIS — M86671 Other chronic osteomyelitis, right ankle and foot: Secondary | ICD-10-CM | POA: Diagnosis not present

## 2018-07-15 DIAGNOSIS — L97422 Non-pressure chronic ulcer of left heel and midfoot with fat layer exposed: Secondary | ICD-10-CM | POA: Diagnosis not present

## 2018-07-15 DIAGNOSIS — R739 Hyperglycemia, unspecified: Secondary | ICD-10-CM | POA: Diagnosis not present

## 2018-07-15 DIAGNOSIS — D509 Iron deficiency anemia, unspecified: Secondary | ICD-10-CM | POA: Diagnosis not present

## 2018-07-15 DIAGNOSIS — Z23 Encounter for immunization: Secondary | ICD-10-CM | POA: Diagnosis not present

## 2018-07-15 NOTE — Telephone Encounter (Signed)
That's fine

## 2018-07-15 NOTE — Telephone Encounter (Signed)
Vaughan Basta - [facility] states she can not take wound care orders from Dr. March Rummage and [place] and pt would like to continue his care with Dr. March Rummage. I told Vaughan Basta I would inform Dr. March Rummage and call again.

## 2018-07-18 DIAGNOSIS — D509 Iron deficiency anemia, unspecified: Secondary | ICD-10-CM | POA: Diagnosis not present

## 2018-07-18 DIAGNOSIS — Z23 Encounter for immunization: Secondary | ICD-10-CM | POA: Diagnosis not present

## 2018-07-18 DIAGNOSIS — E10621 Type 1 diabetes mellitus with foot ulcer: Secondary | ICD-10-CM | POA: Diagnosis not present

## 2018-07-18 DIAGNOSIS — L97422 Non-pressure chronic ulcer of left heel and midfoot with fat layer exposed: Secondary | ICD-10-CM | POA: Diagnosis not present

## 2018-07-18 DIAGNOSIS — I120 Hypertensive chronic kidney disease with stage 5 chronic kidney disease or end stage renal disease: Secondary | ICD-10-CM | POA: Diagnosis not present

## 2018-07-18 DIAGNOSIS — M86671 Other chronic osteomyelitis, right ankle and foot: Secondary | ICD-10-CM | POA: Diagnosis not present

## 2018-07-18 DIAGNOSIS — E1042 Type 1 diabetes mellitus with diabetic polyneuropathy: Secondary | ICD-10-CM | POA: Diagnosis not present

## 2018-07-18 DIAGNOSIS — R739 Hyperglycemia, unspecified: Secondary | ICD-10-CM | POA: Diagnosis not present

## 2018-07-18 DIAGNOSIS — N186 End stage renal disease: Secondary | ICD-10-CM | POA: Diagnosis not present

## 2018-07-18 DIAGNOSIS — N2581 Secondary hyperparathyroidism of renal origin: Secondary | ICD-10-CM | POA: Diagnosis not present

## 2018-07-18 DIAGNOSIS — E1022 Type 1 diabetes mellitus with diabetic chronic kidney disease: Secondary | ICD-10-CM | POA: Diagnosis not present

## 2018-07-18 NOTE — Telephone Encounter (Signed)
Santyl WTD dressing to L foot wound thrice weekly

## 2018-07-18 NOTE — Telephone Encounter (Signed)
Eft message with Dr. Eleanora Neighbor 07/18/2018 8:25am orders.

## 2018-07-20 DIAGNOSIS — Z23 Encounter for immunization: Secondary | ICD-10-CM | POA: Diagnosis not present

## 2018-07-20 DIAGNOSIS — I120 Hypertensive chronic kidney disease with stage 5 chronic kidney disease or end stage renal disease: Secondary | ICD-10-CM | POA: Diagnosis not present

## 2018-07-20 DIAGNOSIS — M86671 Other chronic osteomyelitis, right ankle and foot: Secondary | ICD-10-CM | POA: Diagnosis not present

## 2018-07-20 DIAGNOSIS — D509 Iron deficiency anemia, unspecified: Secondary | ICD-10-CM | POA: Diagnosis not present

## 2018-07-20 DIAGNOSIS — L97422 Non-pressure chronic ulcer of left heel and midfoot with fat layer exposed: Secondary | ICD-10-CM | POA: Diagnosis not present

## 2018-07-20 DIAGNOSIS — N2581 Secondary hyperparathyroidism of renal origin: Secondary | ICD-10-CM | POA: Diagnosis not present

## 2018-07-20 DIAGNOSIS — R739 Hyperglycemia, unspecified: Secondary | ICD-10-CM | POA: Diagnosis not present

## 2018-07-20 DIAGNOSIS — E1042 Type 1 diabetes mellitus with diabetic polyneuropathy: Secondary | ICD-10-CM | POA: Diagnosis not present

## 2018-07-20 DIAGNOSIS — E10621 Type 1 diabetes mellitus with foot ulcer: Secondary | ICD-10-CM | POA: Diagnosis not present

## 2018-07-20 DIAGNOSIS — N186 End stage renal disease: Secondary | ICD-10-CM | POA: Diagnosis not present

## 2018-07-20 DIAGNOSIS — E1022 Type 1 diabetes mellitus with diabetic chronic kidney disease: Secondary | ICD-10-CM | POA: Diagnosis not present

## 2018-07-21 ENCOUNTER — Encounter: Payer: Self-pay | Admitting: Podiatry

## 2018-07-21 ENCOUNTER — Ambulatory Visit (INDEPENDENT_AMBULATORY_CARE_PROVIDER_SITE_OTHER): Payer: Medicare Other | Admitting: Podiatry

## 2018-07-21 DIAGNOSIS — E08621 Diabetes mellitus due to underlying condition with foot ulcer: Secondary | ICD-10-CM | POA: Diagnosis not present

## 2018-07-21 DIAGNOSIS — L97421 Non-pressure chronic ulcer of left heel and midfoot limited to breakdown of skin: Secondary | ICD-10-CM | POA: Diagnosis not present

## 2018-07-22 DIAGNOSIS — M86671 Other chronic osteomyelitis, right ankle and foot: Secondary | ICD-10-CM | POA: Diagnosis not present

## 2018-07-22 DIAGNOSIS — L97422 Non-pressure chronic ulcer of left heel and midfoot with fat layer exposed: Secondary | ICD-10-CM | POA: Diagnosis not present

## 2018-07-22 DIAGNOSIS — R739 Hyperglycemia, unspecified: Secondary | ICD-10-CM | POA: Diagnosis not present

## 2018-07-22 DIAGNOSIS — I120 Hypertensive chronic kidney disease with stage 5 chronic kidney disease or end stage renal disease: Secondary | ICD-10-CM | POA: Diagnosis not present

## 2018-07-22 DIAGNOSIS — N186 End stage renal disease: Secondary | ICD-10-CM | POA: Diagnosis not present

## 2018-07-22 DIAGNOSIS — E10621 Type 1 diabetes mellitus with foot ulcer: Secondary | ICD-10-CM | POA: Diagnosis not present

## 2018-07-22 DIAGNOSIS — T82868A Thrombosis of vascular prosthetic devices, implants and grafts, initial encounter: Secondary | ICD-10-CM | POA: Diagnosis not present

## 2018-07-22 DIAGNOSIS — I871 Compression of vein: Secondary | ICD-10-CM | POA: Diagnosis not present

## 2018-07-22 DIAGNOSIS — Z23 Encounter for immunization: Secondary | ICD-10-CM | POA: Diagnosis not present

## 2018-07-22 DIAGNOSIS — E1022 Type 1 diabetes mellitus with diabetic chronic kidney disease: Secondary | ICD-10-CM | POA: Diagnosis not present

## 2018-07-22 DIAGNOSIS — E1042 Type 1 diabetes mellitus with diabetic polyneuropathy: Secondary | ICD-10-CM | POA: Diagnosis not present

## 2018-07-22 DIAGNOSIS — N2581 Secondary hyperparathyroidism of renal origin: Secondary | ICD-10-CM | POA: Diagnosis not present

## 2018-07-22 DIAGNOSIS — D509 Iron deficiency anemia, unspecified: Secondary | ICD-10-CM | POA: Diagnosis not present

## 2018-07-22 DIAGNOSIS — Z992 Dependence on renal dialysis: Secondary | ICD-10-CM | POA: Diagnosis not present

## 2018-07-25 DIAGNOSIS — R739 Hyperglycemia, unspecified: Secondary | ICD-10-CM | POA: Diagnosis not present

## 2018-07-25 DIAGNOSIS — E10621 Type 1 diabetes mellitus with foot ulcer: Secondary | ICD-10-CM | POA: Diagnosis not present

## 2018-07-25 DIAGNOSIS — E1042 Type 1 diabetes mellitus with diabetic polyneuropathy: Secondary | ICD-10-CM | POA: Diagnosis not present

## 2018-07-25 DIAGNOSIS — E1022 Type 1 diabetes mellitus with diabetic chronic kidney disease: Secondary | ICD-10-CM | POA: Diagnosis not present

## 2018-07-25 DIAGNOSIS — M86671 Other chronic osteomyelitis, right ankle and foot: Secondary | ICD-10-CM | POA: Diagnosis not present

## 2018-07-25 DIAGNOSIS — N186 End stage renal disease: Secondary | ICD-10-CM | POA: Diagnosis not present

## 2018-07-25 DIAGNOSIS — N2581 Secondary hyperparathyroidism of renal origin: Secondary | ICD-10-CM | POA: Diagnosis not present

## 2018-07-25 DIAGNOSIS — D509 Iron deficiency anemia, unspecified: Secondary | ICD-10-CM | POA: Diagnosis not present

## 2018-07-25 DIAGNOSIS — Z23 Encounter for immunization: Secondary | ICD-10-CM | POA: Diagnosis not present

## 2018-07-25 DIAGNOSIS — L97422 Non-pressure chronic ulcer of left heel and midfoot with fat layer exposed: Secondary | ICD-10-CM | POA: Diagnosis not present

## 2018-07-25 DIAGNOSIS — I120 Hypertensive chronic kidney disease with stage 5 chronic kidney disease or end stage renal disease: Secondary | ICD-10-CM | POA: Diagnosis not present

## 2018-07-26 NOTE — Progress Notes (Signed)
Subjective:  Patient ID: [name], male    DOB: 12/27/1980,  MRN: [id number]  Chief Complaint  Patient presents with  . Foot Ulcer    F/u on diabetic ulcer Pt is feeling better since last time. Diabetic type 1     37 y.o. male presents with the above complaint. History as above.  Review of Systems: Negative except as noted in the HPI. Denies N/V/F/Ch.  Past Medical History:  Diagnosis Date  . Diabetes mellitus without complication ([place])   . Diabetic gastroparesis ([place])   . Dialysis patient ([place])   . Hypertension   . Renal disorder    Dialysis  . Sepsis ([place])     Current Outpatient Medications:  .  acetaminophen (TYLENOL) 325 MG tablet, Take 2 tablets (650 mg total) by mouth every 6 (six) hours as needed for mild pain or headache (pain/headache). Can restart this after you are done with Oxycodone/Tylenol, Disp: , Rfl:  .  amLODipine (NORVASC) 10 MG tablet, Take 10 mg by mouth daily., Disp: , Rfl:  .  cinacalcet (SENSIPAR) 30 MG tablet, Take 30 mg by mouth daily with supper., Disp: , Rfl:  .  clindamycin (CLEOCIN) 150 MG capsule, Take 1 capsule (150 mg total) by mouth 2 (two) times daily., Disp: 14 capsule, Rfl: 0 .  clonazePAM (KLONOPIN) 0.5 MG tablet, Take 0.5 mg by mouth daily as needed for anxiety (sleep). , Disp: , Rfl:  .  cloNIDine (CATAPRES - DOSED IN MG/24 HR) 0.3 mg/24hr patch, APPLY ONE PATCH TOPICALLY ONCE A WEEK, Disp: , Rfl:  .  collagenase (SANTYL) ointment, Apply 1 application topically daily., Disp: 15 g, Rfl: 5 .  glucose blood ([employer] TEST STRIPS) test strip, by Other route 4 (four) times a day as needed., Disp: , Rfl:  .  insulin aspart (NOVOLOG) 100 UNIT/ML injection, Inject 5 Units into the skin 2 (two) times daily with a meal. , Disp: , Rfl:  .  insulin glargine (LANTUS) 100 UNIT/ML injection, Inject 0.1 mLs (10 Units total) into the skin at bedtime., Disp: 10 mL, Rfl: 1 .  losartan (COZAAR) 50 MG tablet, Take 50 mg by mouth daily., Disp: , Rfl:  .   metoCLOPramide (REGLAN) 5 MG tablet, TAKE ONE TABLET BY MOUTH THREE TIMES DAILY BEFORE MEAL(S), Disp: , Rfl:  .  ondansetron (ZOFRAN ODT) 4 MG disintegrating tablet, Take 1 tablet (4 mg total) by mouth every 8 (eight) hours as needed for nausea or vomiting., Disp: 10 tablet, Rfl: 0 .  oxyCODONE-acetaminophen (PERCOCET/ROXICET) 5-325 MG tablet, Take 1 tablet by mouth every 6 (six) hours as needed., Disp: 10 tablet, Rfl: 0 .  sevelamer carbonate (RENVELA) 800 MG tablet, Take 2,400-3,200 mg by mouth See admin instructions. Take 3 - 4 tablets (2400 mg -3200 mg) by mouth two times daily with meals, Disp: , Rfl:   Social History   Tobacco Use  Smoking Status Never Smoker  Smokeless Tobacco Never Used    No Known Allergies Objective:  There were no vitals filed for this visit. There is no height or weight on file to calculate BMI. Constitutional Well developed. Well nourished.  Vascular Dorsalis pedis pulses palpable bilaterally. Posterior tibial pulses palpable bilaterally. Capillary refill normal to all digits.  No cyanosis or clubbing noted. Pedal hair growth normal.  Neurologic Normal speech. Oriented to person, place, and time. Epicritic sensation to light touch grossly present bilaterally.  Dermatologic Nails well groomed and normal in appearance. No skin lesions. Right plantar foot ulceration with macerated  epithelium noted upon debridement  Left fifth MPJ with open ulceration 3.5 x 3.5 upon debridement without capsule or bone exposure.     Orthopedic: Normal joint ROM without pain or crepitus bilaterally. No visible deformities. 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  No pain palpation about the third fourth fifth metatarsals right foot   Radiographs: None today.  Assessment:   1. Diabetic ulcer of left midfoot associated with diabetes mellitus due to underlying condition, limited to breakdown of skin [facility])    Plan:  Patient was  evaluated and treated and all questions answered.  Ulceration left fifth MPJ -Debrided as below -Continue Santyl  Procedure: Excisional Debridement of Wound Rationale: Removal of non-viable soft tissue from the wound to promote healing.  Anesthesia: none Pre-Debridement Wound Measurements: 3 cm x  cm x 0.2 cm  Post-Debridement Wound Measurements: 3.5 cm x 3.5 cm x 0.2 cm  Type of Debridement: Sharp Excisional Tissue Removed: Non-viable soft tissue Depth of Debridement: subcutaneous tissue. Technique: Sharp excisional debridement to bleeding, viable wound base.  Dressing: Dry, sterile, compression dressing. Disposition: Patient tolerated procedure well. Patient to return in 1 week for follow-up.  Ulceration plantar right forefoot -Healed. No debridement today. -Continue surgical shoe until we get his DM shoes.  Return in about 1 week (around 07/21/2018).

## 2018-07-26 NOTE — Progress Notes (Signed)
Subjective:  Patient ID: [name], male    DOB: [birth year],  MRN: [id number]  Chief Complaint  Patient presents with  . Foot Ulcer    bilateral follow up; pt stated, "little drainage on L but no pain"    37 y.o. male presents with the above complaint. History as above.  Review of Systems: Negative except as noted in the HPI. Denies N/V/F/Ch.  Past Medical History:  Diagnosis Date  . Diabetes mellitus without complication ([place])   . Diabetic gastroparesis ([place])   . Dialysis patient ([place])   . Hypertension   . Renal disorder    Dialysis  . Sepsis ([place])     Current Outpatient Medications:  .  acetaminophen (TYLENOL) 325 MG tablet, Take 2 tablets (650 mg total) by mouth every 6 (six) hours as needed for mild pain or headache (pain/headache). Can restart this after you are done with Oxycodone/Tylenol, Disp: , Rfl:  .  amLODipine (NORVASC) 10 MG tablet, Take 10 mg by mouth daily., Disp: , Rfl:  .  cinacalcet (SENSIPAR) 30 MG tablet, Take 30 mg by mouth daily with supper., Disp: , Rfl:  .  clindamycin (CLEOCIN) 150 MG capsule, Take 1 capsule (150 mg total) by mouth 2 (two) times daily., Disp: 14 capsule, Rfl: 0 .  clonazePAM (KLONOPIN) 0.5 MG tablet, Take 0.5 mg by mouth daily as needed for anxiety (sleep). , Disp: , Rfl:  .  cloNIDine (CATAPRES - DOSED IN MG/24 HR) 0.3 mg/24hr patch, APPLY ONE PATCH TOPICALLY ONCE A WEEK, Disp: , Rfl:  .  collagenase (SANTYL) ointment, Apply 1 application topically daily., Disp: 15 g, Rfl: 5 .  glucose blood ([employer]ER TEST STRIPS) test strip, by Other route 4 (four) times a day as needed., Disp: , Rfl:  .  insulin aspart (NOVOLOG) 100 UNIT/ML injection, Inject 5 Units into the skin 2 (two) times daily with a meal. , Disp: , Rfl:  .  insulin glargine (LANTUS) 100 UNIT/ML injection, Inject 0.1 mLs (10 Units total) into the skin at bedtime., Disp: 10 mL, Rfl: 1 .  losartan (COZAAR) 50 MG tablet, Take 50 mg by mouth daily., Disp: , Rfl:  .   metoCLOPramide (REGLAN) 5 MG tablet, TAKE ONE TABLET BY MOUTH THREE TIMES DAILY BEFORE MEAL(S), Disp: , Rfl:  .  ondansetron (ZOFRAN ODT) 4 MG disintegrating tablet, Take 1 tablet (4 mg total) by mouth every 8 (eight) hours as needed for nausea or vomiting., Disp: 10 tablet, Rfl: 0 .  oxyCODONE-acetaminophen (PERCOCET/ROXICET) 5-325 MG tablet, Take 1 tablet by mouth every 6 (six) hours as needed., Disp: 10 tablet, Rfl: 0 .  sevelamer carbonate (RENVELA) 800 MG tablet, Take 2,400-3,200 mg by mouth See admin instructions. Take 3 - 4 tablets (2400 mg -3200 mg) by mouth two times daily with meals, Disp: , Rfl:   Social History   Tobacco Use  Smoking Status Never Smoker  Smokeless Tobacco Never Used    No Known Allergies Objective:  There were no vitals filed for this visit. There is no height or weight on file to calculate BMI. Constitutional Well developed. Well nourished.  Vascular Dorsalis pedis pulses palpable bilaterally. Posterior tibial pulses palpable bilaterally. Capillary refill normal to all digits.  No cyanosis or clubbing noted. Pedal hair growth normal.  Neurologic Normal speech. Oriented to person, place, and time. Epicritic sensation to light touch grossly present bilaterally.  Dermatologic Nails well groomed and normal in appearance. No skin lesions. Right plantar foot ulceration with macerated epithelium noted upon  debridement  Left fifth MPJ with open ulceration 3x3 upon debridement without capsule or bone exposure.  Orthopedic: Normal joint ROM without pain or crepitus bilaterally. No visible deformities. 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  No pain palpation about the third fourth fifth metatarsals right foot   Radiographs: None today.  Assessment:   1. Diabetic ulcer of left midfoot associated with diabetes mellitus due to underlying condition, limited to breakdown of skin [facility])    Plan:  Patient was evaluated and  treated and all questions answered.  Ulceration left fifth MPJ -Debrided as below -Continue Santyl -Dressed with Santyl DSD.  Procedure: Excisional Debridement of Wound Rationale: Removal of non-viable soft tissue from the wound to promote healing.  Anesthesia: none Pre-Debridement Wound Measurements: 3 cm x 3 cm x 0.1 cm  Post-Debridement Wound Measurements: 3 cm x 3 cm x 0.1 cm  Type of Debridement: Sharp Excisional Tissue Removed: Non-viable soft tissue Depth of Debridement: subcutaneous tissue. Technique: Sharp excisional debridement to bleeding, viable wound base.  Dressing: Dry, sterile, compression dressing. Disposition: Patient tolerated procedure well. Patient to return in 1 week for follow-up.   Ulceration plantar right forefoot -Healed. No debridement today. -Continue surgical shoe until we get his DM shoes.  Return in about 2 weeks (around 08/04/2018) for Wound Care, Left.

## 2018-07-27 DIAGNOSIS — N2581 Secondary hyperparathyroidism of renal origin: Secondary | ICD-10-CM | POA: Diagnosis not present

## 2018-07-27 DIAGNOSIS — Z23 Encounter for immunization: Secondary | ICD-10-CM | POA: Diagnosis not present

## 2018-07-27 DIAGNOSIS — R739 Hyperglycemia, unspecified: Secondary | ICD-10-CM | POA: Diagnosis not present

## 2018-07-27 DIAGNOSIS — E1022 Type 1 diabetes mellitus with diabetic chronic kidney disease: Secondary | ICD-10-CM | POA: Diagnosis not present

## 2018-07-27 DIAGNOSIS — D509 Iron deficiency anemia, unspecified: Secondary | ICD-10-CM | POA: Diagnosis not present

## 2018-07-27 DIAGNOSIS — N186 End stage renal disease: Secondary | ICD-10-CM | POA: Diagnosis not present

## 2018-07-28 DIAGNOSIS — R159 Full incontinence of feces: Secondary | ICD-10-CM | POA: Diagnosis not present

## 2018-07-28 DIAGNOSIS — E1042 Type 1 diabetes mellitus with diabetic polyneuropathy: Secondary | ICD-10-CM | POA: Diagnosis not present

## 2018-07-28 DIAGNOSIS — E1022 Type 1 diabetes mellitus with diabetic chronic kidney disease: Secondary | ICD-10-CM | POA: Diagnosis not present

## 2018-07-28 DIAGNOSIS — M86671 Other chronic osteomyelitis, right ankle and foot: Secondary | ICD-10-CM | POA: Diagnosis not present

## 2018-07-28 DIAGNOSIS — K591 Functional diarrhea: Secondary | ICD-10-CM | POA: Diagnosis not present

## 2018-07-28 DIAGNOSIS — I120 Hypertensive chronic kidney disease with stage 5 chronic kidney disease or end stage renal disease: Secondary | ICD-10-CM | POA: Diagnosis not present

## 2018-07-28 DIAGNOSIS — L97422 Non-pressure chronic ulcer of left heel and midfoot with fat layer exposed: Secondary | ICD-10-CM | POA: Diagnosis not present

## 2018-07-28 DIAGNOSIS — E10621 Type 1 diabetes mellitus with foot ulcer: Secondary | ICD-10-CM | POA: Diagnosis not present

## 2018-07-29 DIAGNOSIS — R739 Hyperglycemia, unspecified: Secondary | ICD-10-CM | POA: Diagnosis not present

## 2018-07-29 DIAGNOSIS — N2581 Secondary hyperparathyroidism of renal origin: Secondary | ICD-10-CM | POA: Diagnosis not present

## 2018-07-29 DIAGNOSIS — E1022 Type 1 diabetes mellitus with diabetic chronic kidney disease: Secondary | ICD-10-CM | POA: Diagnosis not present

## 2018-07-29 DIAGNOSIS — E10621 Type 1 diabetes mellitus with foot ulcer: Secondary | ICD-10-CM | POA: Diagnosis not present

## 2018-07-29 DIAGNOSIS — M86671 Other chronic osteomyelitis, right ankle and foot: Secondary | ICD-10-CM | POA: Diagnosis not present

## 2018-07-29 DIAGNOSIS — E1042 Type 1 diabetes mellitus with diabetic polyneuropathy: Secondary | ICD-10-CM | POA: Diagnosis not present

## 2018-07-29 DIAGNOSIS — N186 End stage renal disease: Secondary | ICD-10-CM | POA: Diagnosis not present

## 2018-07-29 DIAGNOSIS — I129 Hypertensive chronic kidney disease with stage 1 through stage 4 chronic kidney disease, or unspecified chronic kidney disease: Secondary | ICD-10-CM | POA: Diagnosis not present

## 2018-07-29 DIAGNOSIS — L97422 Non-pressure chronic ulcer of left heel and midfoot with fat layer exposed: Secondary | ICD-10-CM | POA: Diagnosis not present

## 2018-07-29 DIAGNOSIS — D509 Iron deficiency anemia, unspecified: Secondary | ICD-10-CM | POA: Diagnosis not present

## 2018-07-29 DIAGNOSIS — D631 Anemia in chronic kidney disease: Secondary | ICD-10-CM | POA: Diagnosis not present

## 2018-07-29 DIAGNOSIS — Z992 Dependence on renal dialysis: Secondary | ICD-10-CM | POA: Diagnosis not present

## 2018-07-29 DIAGNOSIS — I120 Hypertensive chronic kidney disease with stage 5 chronic kidney disease or end stage renal disease: Secondary | ICD-10-CM | POA: Diagnosis not present

## 2018-08-01 DIAGNOSIS — N186 End stage renal disease: Secondary | ICD-10-CM | POA: Diagnosis not present

## 2018-08-01 DIAGNOSIS — M86671 Other chronic osteomyelitis, right ankle and foot: Secondary | ICD-10-CM | POA: Diagnosis not present

## 2018-08-01 DIAGNOSIS — E1022 Type 1 diabetes mellitus with diabetic chronic kidney disease: Secondary | ICD-10-CM | POA: Diagnosis not present

## 2018-08-01 DIAGNOSIS — E10621 Type 1 diabetes mellitus with foot ulcer: Secondary | ICD-10-CM | POA: Diagnosis not present

## 2018-08-01 DIAGNOSIS — R739 Hyperglycemia, unspecified: Secondary | ICD-10-CM | POA: Diagnosis not present

## 2018-08-01 DIAGNOSIS — I120 Hypertensive chronic kidney disease with stage 5 chronic kidney disease or end stage renal disease: Secondary | ICD-10-CM | POA: Diagnosis not present

## 2018-08-01 DIAGNOSIS — E1042 Type 1 diabetes mellitus with diabetic polyneuropathy: Secondary | ICD-10-CM | POA: Diagnosis not present

## 2018-08-01 DIAGNOSIS — N2581 Secondary hyperparathyroidism of renal origin: Secondary | ICD-10-CM | POA: Diagnosis not present

## 2018-08-01 DIAGNOSIS — D631 Anemia in chronic kidney disease: Secondary | ICD-10-CM | POA: Diagnosis not present

## 2018-08-01 DIAGNOSIS — D509 Iron deficiency anemia, unspecified: Secondary | ICD-10-CM | POA: Diagnosis not present

## 2018-08-01 DIAGNOSIS — L97422 Non-pressure chronic ulcer of left heel and midfoot with fat layer exposed: Secondary | ICD-10-CM | POA: Diagnosis not present

## 2018-08-03 DIAGNOSIS — N186 End stage renal disease: Secondary | ICD-10-CM | POA: Diagnosis not present

## 2018-08-03 DIAGNOSIS — N2581 Secondary hyperparathyroidism of renal origin: Secondary | ICD-10-CM | POA: Diagnosis not present

## 2018-08-03 DIAGNOSIS — E1022 Type 1 diabetes mellitus with diabetic chronic kidney disease: Secondary | ICD-10-CM | POA: Diagnosis not present

## 2018-08-03 DIAGNOSIS — R739 Hyperglycemia, unspecified: Secondary | ICD-10-CM | POA: Diagnosis not present

## 2018-08-03 DIAGNOSIS — D631 Anemia in chronic kidney disease: Secondary | ICD-10-CM | POA: Diagnosis not present

## 2018-08-03 DIAGNOSIS — D509 Iron deficiency anemia, unspecified: Secondary | ICD-10-CM | POA: Diagnosis not present

## 2018-08-04 ENCOUNTER — Ambulatory Visit (INDEPENDENT_AMBULATORY_CARE_PROVIDER_SITE_OTHER): Payer: Medicare Other

## 2018-08-04 ENCOUNTER — Ambulatory Visit (INDEPENDENT_AMBULATORY_CARE_PROVIDER_SITE_OTHER): Payer: Medicare Other | Admitting: Podiatry

## 2018-08-04 DIAGNOSIS — L97421 Non-pressure chronic ulcer of left heel and midfoot limited to breakdown of skin: Secondary | ICD-10-CM

## 2018-08-04 DIAGNOSIS — E08621 Diabetes mellitus due to underlying condition with foot ulcer: Secondary | ICD-10-CM | POA: Diagnosis not present

## 2018-08-04 NOTE — Progress Notes (Signed)
Subjective:  Patient ID: [name], male    DOB: Jan 29, 1981,  MRN: [id number]  Chief Complaint  Patient presents with  . Diabetic Ulcer    bilateral foot check    37 y.o. male presents with the above complaint.  Here for wound check a left foot.  Admits to not wearing his surgical shoe and walking barefoot.   Review of Systems: Negative except as noted in the HPI. Denies N/V/F/Ch.  Past Medical History:  Diagnosis Date  . Diabetes mellitus without complication ([place])   . Diabetic gastroparesis ([place])   . Dialysis patient ([place])   . Hypertension   . Renal disorder    Dialysis  . Sepsis ([place])     Current Outpatient Medications:  .  acetaminophen (TYLENOL) 325 MG tablet, Take 2 tablets (650 mg total) by mouth every 6 (six) hours as needed for mild pain or headache (pain/headache). Can restart this after you are done with Oxycodone/Tylenol, Disp: , Rfl:  .  amLODipine (NORVASC) 10 MG tablet, Take 10 mg by mouth daily., Disp: , Rfl:  .  cinacalcet (SENSIPAR) 30 MG tablet, Take 30 mg by mouth daily with supper., Disp: , Rfl:  .  clindamycin (CLEOCIN) 150 MG capsule, Take 1 capsule (150 mg total) by mouth 2 (two) times daily., Disp: 14 capsule, Rfl: 0 .  clonazePAM (KLONOPIN) 0.5 MG tablet, Take 0.5 mg by mouth daily as needed for anxiety (sleep). , Disp: , Rfl:  .  cloNIDine (CATAPRES - DOSED IN MG/24 HR) 0.3 mg/24hr patch, APPLY ONE PATCH TOPICALLY ONCE A WEEK, Disp: , Rfl:  .  collagenase (SANTYL) ointment, Apply 1 application topically daily., Disp: 15 g, Rfl: 5 .  glucose blood ([employer]ER TEST STRIPS) test strip, by Other route 4 (four) times a day as needed., Disp: , Rfl:  .  insulin aspart (NOVOLOG) 100 UNIT/ML injection, Inject 5 Units into the skin 2 (two) times daily with a meal. , Disp: , Rfl:  .  insulin glargine (LANTUS) 100 UNIT/ML injection, Inject 0.1 mLs (10 Units total) into the skin at bedtime., Disp: 10 mL, Rfl: 1 .  losartan (COZAAR) 50 MG tablet, Take 50 mg by mouth  daily., Disp: , Rfl:  .  metoCLOPramide (REGLAN) 5 MG tablet, TAKE ONE TABLET BY MOUTH THREE TIMES DAILY BEFORE MEAL(S), Disp: , Rfl:  .  ondansetron (ZOFRAN ODT) 4 MG disintegrating tablet, Take 1 tablet (4 mg total) by mouth every 8 (eight) hours as needed for nausea or vomiting., Disp: 10 tablet, Rfl: 0 .  oxyCODONE-acetaminophen (PERCOCET/ROXICET) 5-325 MG tablet, Take 1 tablet by mouth every 6 (six) hours as needed., Disp: 10 tablet, Rfl: 0 .  sevelamer carbonate (RENVELA) 800 MG tablet, Take 2,400-3,200 mg by mouth See admin instructions. Take 3 - 4 tablets (2400 mg -3200 mg) by mouth two times daily with meals, Disp: , Rfl:   Social History   Tobacco Use  Smoking Status Never Smoker  Smokeless Tobacco Never Used    No Known Allergies Objective:  There were no vitals filed for this visit. There is no height or weight on file to calculate BMI. Constitutional Well developed. Well nourished.  Vascular Dorsalis pedis pulses palpable bilaterally. Posterior tibial pulses palpable bilaterally. Capillary refill normal to all digits.  No cyanosis or clubbing noted. Pedal hair growth normal.  Neurologic Normal speech. Oriented to person, place, and time. Epicritic sensation to light touch grossly present bilaterally.  Dermatologic Nails well groomed and normal in appearance. No skin lesions.  Left fifth MPJ open ulceration 2 x 2 pre-debridement 4 x 4 post debridement with fibro-granular base.  Prior to debridement gross dirt and grass located within the wound  Orthopedic: Normal joint ROM without pain or crepitus bilaterally. No visible deformities. 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    Radiographs: Taken reviewed no underlying osseous erosions  Assessment:   1. Diabetic ulcer of left midfoot associated with diabetes mellitus due to underlying condition, limited to breakdown of skin [facility])    Plan:  Patient was evaluated and treated and  all questions answered.  Ulceration left fifth MPJ -Debrided as below -Continue Santyl -Dressed with Betadine DSD.  -X-rays taken to rule out osseous erosions.  Discussed with patient that should he continue to be noncompliant and wearing his shoe he is likely to go on to get an infection of the ulceration, possibly resulting in fifth metatarsal head resection or possible amputation of the fifth toe.  Patient verbalized understanding.  Procedure: Excisional Debridement of Wound Rationale: Removal of non-viable soft tissue from the wound to promote healing.  Anesthesia: none Pre-Debridement Wound Measurements: 2 cm x 2 cm x 0.5 cm  Post-Debridement Wound Measurements: 4 cm x 4 cm x 0.5 cm  Type of Debridement: Sharp Excisional Tissue Removed: Non-viable soft tissue Depth of Debridement: subcutaneous tissue. Technique: Sharp excisional debridement to bleeding, viable wound base.  Dressing: Dry, sterile, compression dressing. Disposition: Patient tolerated procedure well. Patient to return in 1 week for follow-up.  Ulceration plantar right forefoot -Healed. No debridement today. -Continue surgical shoe until we get his DM shoes.  Return in about 1 week (around 08/11/2018) for Wound Care, Left.

## 2018-08-05 DIAGNOSIS — N2581 Secondary hyperparathyroidism of renal origin: Secondary | ICD-10-CM | POA: Diagnosis not present

## 2018-08-05 DIAGNOSIS — E1022 Type 1 diabetes mellitus with diabetic chronic kidney disease: Secondary | ICD-10-CM | POA: Diagnosis not present

## 2018-08-05 DIAGNOSIS — R739 Hyperglycemia, unspecified: Secondary | ICD-10-CM | POA: Diagnosis not present

## 2018-08-05 DIAGNOSIS — N186 End stage renal disease: Secondary | ICD-10-CM | POA: Diagnosis not present

## 2018-08-05 DIAGNOSIS — D509 Iron deficiency anemia, unspecified: Secondary | ICD-10-CM | POA: Diagnosis not present

## 2018-08-05 DIAGNOSIS — D631 Anemia in chronic kidney disease: Secondary | ICD-10-CM | POA: Diagnosis not present

## 2018-08-08 DIAGNOSIS — M86671 Other chronic osteomyelitis, right ankle and foot: Secondary | ICD-10-CM | POA: Diagnosis not present

## 2018-08-08 DIAGNOSIS — I120 Hypertensive chronic kidney disease with stage 5 chronic kidney disease or end stage renal disease: Secondary | ICD-10-CM | POA: Diagnosis not present

## 2018-08-08 DIAGNOSIS — R739 Hyperglycemia, unspecified: Secondary | ICD-10-CM | POA: Diagnosis not present

## 2018-08-08 DIAGNOSIS — E10621 Type 1 diabetes mellitus with foot ulcer: Secondary | ICD-10-CM | POA: Diagnosis not present

## 2018-08-08 DIAGNOSIS — E1042 Type 1 diabetes mellitus with diabetic polyneuropathy: Secondary | ICD-10-CM | POA: Diagnosis not present

## 2018-08-08 DIAGNOSIS — N2581 Secondary hyperparathyroidism of renal origin: Secondary | ICD-10-CM | POA: Diagnosis not present

## 2018-08-08 DIAGNOSIS — N186 End stage renal disease: Secondary | ICD-10-CM | POA: Diagnosis not present

## 2018-08-08 DIAGNOSIS — E1022 Type 1 diabetes mellitus with diabetic chronic kidney disease: Secondary | ICD-10-CM | POA: Diagnosis not present

## 2018-08-08 DIAGNOSIS — D509 Iron deficiency anemia, unspecified: Secondary | ICD-10-CM | POA: Diagnosis not present

## 2018-08-08 DIAGNOSIS — D631 Anemia in chronic kidney disease: Secondary | ICD-10-CM | POA: Diagnosis not present

## 2018-08-08 DIAGNOSIS — L97422 Non-pressure chronic ulcer of left heel and midfoot with fat layer exposed: Secondary | ICD-10-CM | POA: Diagnosis not present

## 2018-08-09 DIAGNOSIS — E10621 Type 1 diabetes mellitus with foot ulcer: Secondary | ICD-10-CM | POA: Diagnosis not present

## 2018-08-09 DIAGNOSIS — Z89421 Acquired absence of other right toe(s): Secondary | ICD-10-CM | POA: Diagnosis not present

## 2018-08-09 DIAGNOSIS — L97422 Non-pressure chronic ulcer of left heel and midfoot with fat layer exposed: Secondary | ICD-10-CM | POA: Diagnosis not present

## 2018-08-09 DIAGNOSIS — E1042 Type 1 diabetes mellitus with diabetic polyneuropathy: Secondary | ICD-10-CM | POA: Diagnosis not present

## 2018-08-09 DIAGNOSIS — Z794 Long term (current) use of insulin: Secondary | ICD-10-CM | POA: Diagnosis not present

## 2018-08-09 DIAGNOSIS — E1022 Type 1 diabetes mellitus with diabetic chronic kidney disease: Secondary | ICD-10-CM | POA: Diagnosis not present

## 2018-08-09 DIAGNOSIS — Z992 Dependence on renal dialysis: Secondary | ICD-10-CM | POA: Diagnosis not present

## 2018-08-09 DIAGNOSIS — I120 Hypertensive chronic kidney disease with stage 5 chronic kidney disease or end stage renal disease: Secondary | ICD-10-CM | POA: Diagnosis not present

## 2018-08-09 DIAGNOSIS — N186 End stage renal disease: Secondary | ICD-10-CM | POA: Diagnosis not present

## 2018-08-10 DIAGNOSIS — D509 Iron deficiency anemia, unspecified: Secondary | ICD-10-CM | POA: Diagnosis not present

## 2018-08-10 DIAGNOSIS — E1042 Type 1 diabetes mellitus with diabetic polyneuropathy: Secondary | ICD-10-CM | POA: Diagnosis not present

## 2018-08-10 DIAGNOSIS — L97422 Non-pressure chronic ulcer of left heel and midfoot with fat layer exposed: Secondary | ICD-10-CM | POA: Diagnosis not present

## 2018-08-10 DIAGNOSIS — R739 Hyperglycemia, unspecified: Secondary | ICD-10-CM | POA: Diagnosis not present

## 2018-08-10 DIAGNOSIS — N2581 Secondary hyperparathyroidism of renal origin: Secondary | ICD-10-CM | POA: Diagnosis not present

## 2018-08-10 DIAGNOSIS — E1022 Type 1 diabetes mellitus with diabetic chronic kidney disease: Secondary | ICD-10-CM | POA: Diagnosis not present

## 2018-08-10 DIAGNOSIS — E10621 Type 1 diabetes mellitus with foot ulcer: Secondary | ICD-10-CM | POA: Diagnosis not present

## 2018-08-10 DIAGNOSIS — D631 Anemia in chronic kidney disease: Secondary | ICD-10-CM | POA: Diagnosis not present

## 2018-08-10 DIAGNOSIS — N186 End stage renal disease: Secondary | ICD-10-CM | POA: Diagnosis not present

## 2018-08-10 DIAGNOSIS — I120 Hypertensive chronic kidney disease with stage 5 chronic kidney disease or end stage renal disease: Secondary | ICD-10-CM | POA: Diagnosis not present

## 2018-08-11 ENCOUNTER — Encounter: Payer: Self-pay | Admitting: Podiatry

## 2018-08-11 ENCOUNTER — Ambulatory Visit (INDEPENDENT_AMBULATORY_CARE_PROVIDER_SITE_OTHER): Payer: Medicare Other | Admitting: Podiatry

## 2018-08-11 ENCOUNTER — Ambulatory Visit (INDEPENDENT_AMBULATORY_CARE_PROVIDER_SITE_OTHER): Payer: Medicare Other

## 2018-08-11 DIAGNOSIS — L97421 Non-pressure chronic ulcer of left heel and midfoot limited to breakdown of skin: Secondary | ICD-10-CM

## 2018-08-11 DIAGNOSIS — E08621 Diabetes mellitus due to underlying condition with foot ulcer: Secondary | ICD-10-CM | POA: Diagnosis not present

## 2018-08-11 MED ORDER — CLINDAMYCIN HCL 300 MG PO CAPS: 300.0000 mg | ORAL_CAPSULE | Freq: Two times a day (BID) | ORAL | 0 refills | Status: DC

## 2018-08-12 DIAGNOSIS — D631 Anemia in chronic kidney disease: Secondary | ICD-10-CM | POA: Diagnosis not present

## 2018-08-12 DIAGNOSIS — E1022 Type 1 diabetes mellitus with diabetic chronic kidney disease: Secondary | ICD-10-CM | POA: Diagnosis not present

## 2018-08-12 DIAGNOSIS — L97422 Non-pressure chronic ulcer of left heel and midfoot with fat layer exposed: Secondary | ICD-10-CM | POA: Diagnosis not present

## 2018-08-12 DIAGNOSIS — E10621 Type 1 diabetes mellitus with foot ulcer: Secondary | ICD-10-CM | POA: Diagnosis not present

## 2018-08-12 DIAGNOSIS — E1042 Type 1 diabetes mellitus with diabetic polyneuropathy: Secondary | ICD-10-CM | POA: Diagnosis not present

## 2018-08-12 DIAGNOSIS — R739 Hyperglycemia, unspecified: Secondary | ICD-10-CM | POA: Diagnosis not present

## 2018-08-12 DIAGNOSIS — N186 End stage renal disease: Secondary | ICD-10-CM | POA: Diagnosis not present

## 2018-08-12 DIAGNOSIS — N2581 Secondary hyperparathyroidism of renal origin: Secondary | ICD-10-CM | POA: Diagnosis not present

## 2018-08-12 DIAGNOSIS — I120 Hypertensive chronic kidney disease with stage 5 chronic kidney disease or end stage renal disease: Secondary | ICD-10-CM | POA: Diagnosis not present

## 2018-08-12 DIAGNOSIS — D509 Iron deficiency anemia, unspecified: Secondary | ICD-10-CM | POA: Diagnosis not present

## 2018-08-15 DIAGNOSIS — E10621 Type 1 diabetes mellitus with foot ulcer: Secondary | ICD-10-CM | POA: Diagnosis not present

## 2018-08-15 DIAGNOSIS — D631 Anemia in chronic kidney disease: Secondary | ICD-10-CM | POA: Diagnosis not present

## 2018-08-15 DIAGNOSIS — I871 Compression of vein: Secondary | ICD-10-CM | POA: Diagnosis not present

## 2018-08-15 DIAGNOSIS — I120 Hypertensive chronic kidney disease with stage 5 chronic kidney disease or end stage renal disease: Secondary | ICD-10-CM | POA: Diagnosis not present

## 2018-08-15 DIAGNOSIS — D509 Iron deficiency anemia, unspecified: Secondary | ICD-10-CM | POA: Diagnosis not present

## 2018-08-15 DIAGNOSIS — E1022 Type 1 diabetes mellitus with diabetic chronic kidney disease: Secondary | ICD-10-CM | POA: Diagnosis not present

## 2018-08-15 DIAGNOSIS — L97422 Non-pressure chronic ulcer of left heel and midfoot with fat layer exposed: Secondary | ICD-10-CM | POA: Diagnosis not present

## 2018-08-15 DIAGNOSIS — E1042 Type 1 diabetes mellitus with diabetic polyneuropathy: Secondary | ICD-10-CM | POA: Diagnosis not present

## 2018-08-15 DIAGNOSIS — N186 End stage renal disease: Secondary | ICD-10-CM | POA: Diagnosis not present

## 2018-08-15 DIAGNOSIS — R739 Hyperglycemia, unspecified: Secondary | ICD-10-CM | POA: Diagnosis not present

## 2018-08-15 DIAGNOSIS — Z992 Dependence on renal dialysis: Secondary | ICD-10-CM | POA: Diagnosis not present

## 2018-08-15 DIAGNOSIS — T82868A Thrombosis of vascular prosthetic devices, implants and grafts, initial encounter: Secondary | ICD-10-CM | POA: Diagnosis not present

## 2018-08-15 DIAGNOSIS — N2581 Secondary hyperparathyroidism of renal origin: Secondary | ICD-10-CM | POA: Diagnosis not present

## 2018-08-17 ENCOUNTER — Inpatient Hospital Stay (HOSPITAL_COMMUNITY)
Admission: AD | Admit: 2018-08-17 | Discharge: 2018-08-25 | DRG: 252 | Disposition: A | Discharge status: Home Health | Payer: Medicare Other | Attending: Internal Medicine | Admitting: Internal Medicine

## 2018-08-17 ENCOUNTER — Ambulatory Visit (INDEPENDENT_AMBULATORY_CARE_PROVIDER_SITE_OTHER): Payer: Medicare Other | Admitting: Podiatry

## 2018-08-17 ENCOUNTER — Other Ambulatory Visit: Payer: Self-pay

## 2018-08-17 DIAGNOSIS — Z89421 Acquired absence of other right toe(s): Secondary | ICD-10-CM

## 2018-08-17 DIAGNOSIS — E1143 Type 2 diabetes mellitus with diabetic autonomic (poly)neuropathy: Secondary | ICD-10-CM | POA: Diagnosis not present

## 2018-08-17 DIAGNOSIS — L97421 Non-pressure chronic ulcer of left heel and midfoot limited to breakdown of skin: Secondary | ICD-10-CM | POA: Diagnosis not present

## 2018-08-17 DIAGNOSIS — Z833 Family history of diabetes mellitus: Secondary | ICD-10-CM | POA: Diagnosis not present

## 2018-08-17 DIAGNOSIS — I96 Gangrene, not elsewhere classified: Secondary | ICD-10-CM | POA: Diagnosis not present

## 2018-08-17 DIAGNOSIS — Z89422 Acquired absence of other left toe(s): Secondary | ICD-10-CM | POA: Diagnosis not present

## 2018-08-17 DIAGNOSIS — E1052 Type 1 diabetes mellitus with diabetic peripheral angiopathy with gangrene: Principal | ICD-10-CM | POA: Diagnosis present

## 2018-08-17 DIAGNOSIS — E1169 Type 2 diabetes mellitus with other specified complication: Secondary | ICD-10-CM | POA: Diagnosis not present

## 2018-08-17 DIAGNOSIS — D638 Anemia in other chronic diseases classified elsewhere: Secondary | ICD-10-CM | POA: Diagnosis present

## 2018-08-17 DIAGNOSIS — Z9111 Patient's noncompliance with dietary regimen: Secondary | ICD-10-CM | POA: Diagnosis not present

## 2018-08-17 DIAGNOSIS — N2581 Secondary hyperparathyroidism of renal origin: Secondary | ICD-10-CM | POA: Diagnosis present

## 2018-08-17 DIAGNOSIS — D509 Iron deficiency anemia, unspecified: Secondary | ICD-10-CM | POA: Diagnosis not present

## 2018-08-17 DIAGNOSIS — Z9119 Patient's noncompliance with other medical treatment and regimen: Secondary | ICD-10-CM | POA: Diagnosis not present

## 2018-08-17 DIAGNOSIS — L97424 Non-pressure chronic ulcer of left heel and midfoot with necrosis of bone: Secondary | ICD-10-CM

## 2018-08-17 DIAGNOSIS — E10621 Type 1 diabetes mellitus with foot ulcer: Secondary | ICD-10-CM | POA: Diagnosis present

## 2018-08-17 DIAGNOSIS — Z9889 Other specified postprocedural states: Secondary | ICD-10-CM

## 2018-08-17 DIAGNOSIS — E8889 Other specified metabolic disorders: Secondary | ICD-10-CM | POA: Diagnosis present

## 2018-08-17 DIAGNOSIS — L97529 Non-pressure chronic ulcer of other part of left foot with unspecified severity: Secondary | ICD-10-CM | POA: Diagnosis present

## 2018-08-17 DIAGNOSIS — L02619 Cutaneous abscess of unspecified foot: Secondary | ICD-10-CM | POA: Diagnosis not present

## 2018-08-17 DIAGNOSIS — N186 End stage renal disease: Secondary | ICD-10-CM

## 2018-08-17 DIAGNOSIS — E1065 Type 1 diabetes mellitus with hyperglycemia: Secondary | ICD-10-CM | POA: Diagnosis present

## 2018-08-17 DIAGNOSIS — E08621 Diabetes mellitus due to underlying condition with foot ulcer: Secondary | ICD-10-CM

## 2018-08-17 DIAGNOSIS — M86172 Other acute osteomyelitis, left ankle and foot: Secondary | ICD-10-CM | POA: Diagnosis present

## 2018-08-17 DIAGNOSIS — L03119 Cellulitis of unspecified part of limb: Secondary | ICD-10-CM | POA: Diagnosis not present

## 2018-08-17 DIAGNOSIS — Z992 Dependence on renal dialysis: Secondary | ICD-10-CM | POA: Diagnosis not present

## 2018-08-17 DIAGNOSIS — L03116 Cellulitis of left lower limb: Secondary | ICD-10-CM

## 2018-08-17 DIAGNOSIS — A480 Gas gangrene: Secondary | ICD-10-CM | POA: Diagnosis present

## 2018-08-17 DIAGNOSIS — L97429 Non-pressure chronic ulcer of left heel and midfoot with unspecified severity: Secondary | ICD-10-CM | POA: Diagnosis present

## 2018-08-17 DIAGNOSIS — S91302A Unspecified open wound, left foot, initial encounter: Secondary | ICD-10-CM | POA: Diagnosis not present

## 2018-08-17 DIAGNOSIS — K3184 Gastroparesis: Secondary | ICD-10-CM | POA: Diagnosis present

## 2018-08-17 DIAGNOSIS — E108 Type 1 diabetes mellitus with unspecified complications: Secondary | ICD-10-CM | POA: Diagnosis present

## 2018-08-17 DIAGNOSIS — I10 Essential (primary) hypertension: Secondary | ICD-10-CM

## 2018-08-17 DIAGNOSIS — T8249XD Other complication of vascular dialysis catheter, subsequent encounter: Secondary | ICD-10-CM | POA: Diagnosis not present

## 2018-08-17 DIAGNOSIS — E1043 Type 1 diabetes mellitus with diabetic autonomic (poly)neuropathy: Secondary | ICD-10-CM | POA: Diagnosis present

## 2018-08-17 DIAGNOSIS — L02612 Cutaneous abscess of left foot: Secondary | ICD-10-CM | POA: Diagnosis present

## 2018-08-17 DIAGNOSIS — F319 Bipolar disorder, unspecified: Secondary | ICD-10-CM | POA: Diagnosis present

## 2018-08-17 DIAGNOSIS — Z79899 Other long term (current) drug therapy: Secondary | ICD-10-CM | POA: Diagnosis not present

## 2018-08-17 DIAGNOSIS — E11628 Type 2 diabetes mellitus with other skin complications: Secondary | ICD-10-CM | POA: Diagnosis not present

## 2018-08-17 DIAGNOSIS — D631 Anemia in chronic kidney disease: Secondary | ICD-10-CM | POA: Diagnosis not present

## 2018-08-17 DIAGNOSIS — E1022 Type 1 diabetes mellitus with diabetic chronic kidney disease: Secondary | ICD-10-CM | POA: Diagnosis present

## 2018-08-17 DIAGNOSIS — L03032 Cellulitis of left toe: Secondary | ICD-10-CM | POA: Diagnosis present

## 2018-08-17 DIAGNOSIS — E1069 Type 1 diabetes mellitus with other specified complication: Secondary | ICD-10-CM | POA: Diagnosis present

## 2018-08-17 DIAGNOSIS — E118 Type 2 diabetes mellitus with unspecified complications: Secondary | ICD-10-CM | POA: Diagnosis present

## 2018-08-17 DIAGNOSIS — T82818A Embolism of vascular prosthetic devices, implants and grafts, initial encounter: Secondary | ICD-10-CM | POA: Diagnosis not present

## 2018-08-17 DIAGNOSIS — I120 Hypertensive chronic kidney disease with stage 5 chronic kidney disease or end stage renal disease: Secondary | ICD-10-CM

## 2018-08-17 DIAGNOSIS — I739 Peripheral vascular disease, unspecified: Secondary | ICD-10-CM

## 2018-08-17 DIAGNOSIS — T8249XA Other complication of vascular dialysis catheter, initial encounter: Secondary | ICD-10-CM

## 2018-08-17 DIAGNOSIS — E109 Type 1 diabetes mellitus without complications: Secondary | ICD-10-CM | POA: Diagnosis present

## 2018-08-17 DIAGNOSIS — M869 Osteomyelitis, unspecified: Secondary | ICD-10-CM

## 2018-08-17 DIAGNOSIS — E10628 Type 1 diabetes mellitus with other skin complications: Secondary | ICD-10-CM | POA: Diagnosis not present

## 2018-08-17 DIAGNOSIS — Z794 Long term (current) use of insulin: Secondary | ICD-10-CM | POA: Diagnosis not present

## 2018-08-17 DIAGNOSIS — R739 Hyperglycemia, unspecified: Secondary | ICD-10-CM | POA: Diagnosis not present

## 2018-08-17 DIAGNOSIS — Y832 Surgical operation with anastomosis, bypass or graft as the cause of abnormal reaction of the patient, or of later complication, without mention of misadventure at the time of the procedure: Secondary | ICD-10-CM | POA: Diagnosis not present

## 2018-08-17 DIAGNOSIS — Z481 Encounter for planned postprocedural wound closure: Secondary | ICD-10-CM

## 2018-08-17 DIAGNOSIS — M868X7 Other osteomyelitis, ankle and foot: Secondary | ICD-10-CM | POA: Diagnosis not present

## 2018-08-17 DIAGNOSIS — Z978 Presence of other specified devices: Secondary | ICD-10-CM | POA: Diagnosis not present

## 2018-08-17 DIAGNOSIS — M7989 Other specified soft tissue disorders: Secondary | ICD-10-CM | POA: Diagnosis not present

## 2018-08-17 DIAGNOSIS — E119 Type 2 diabetes mellitus without complications: Secondary | ICD-10-CM

## 2018-08-17 DIAGNOSIS — T82858A Stenosis of vascular prosthetic devices, implants and grafts, initial encounter: Secondary | ICD-10-CM | POA: Diagnosis not present

## 2018-08-17 DIAGNOSIS — S98922A Partial traumatic amputation of left foot, level unspecified, initial encounter: Secondary | ICD-10-CM | POA: Diagnosis not present

## 2018-08-17 DIAGNOSIS — L089 Local infection of the skin and subcutaneous tissue, unspecified: Secondary | ICD-10-CM | POA: Diagnosis not present

## 2018-08-17 LAB — BASIC METABOLIC PANEL
Anion gap: 13 (ref 5–15)
BUN: 44 mg/dL — ABNORMAL HIGH (ref 6–20)
CO2: 26 mmol/L (ref 22–32)
Calcium: 8.5 mg/dL — ABNORMAL LOW (ref 8.9–10.3)
Chloride: 94 mmol/L — ABNORMAL LOW (ref 98–111)
Creatinine, Ser: 9.86 mg/dL — ABNORMAL HIGH (ref 0.61–1.24)
GFR calc Af Amer: 7 mL/min — ABNORMAL LOW (ref >60)
GFR calc non Af Amer: 6 mL/min — ABNORMAL LOW (ref >60)
Glucose, Bld: 107 mg/dL — ABNORMAL HIGH (ref 70–99)
Potassium: 4.2 mmol/L (ref 3.5–5.1)
Sodium: 133 mmol/L — ABNORMAL LOW (ref 135–145)

## 2018-08-17 LAB — CBC
HCT: 27.3 % — ABNORMAL LOW (ref 39.0–52.0)
Hemoglobin: 8.4 g/dL — ABNORMAL LOW (ref 13.0–17.0)
MCH: 28.6 pg (ref 26.0–34.0)
MCHC: 30.8 g/dL (ref 30.0–36.0)
MCV: 92.9 fL (ref 80.0–100.0)
Platelets: 221 10*3/uL (ref 150–400)
RBC: 2.94 MIL/uL — ABNORMAL LOW (ref 4.22–5.81)
RDW: 14 % (ref 11.5–15.5)
WBC: 15.3 10*3/uL — ABNORMAL HIGH (ref 4.0–10.5)
nRBC: 0 % (ref 0.0–0.2)

## 2018-08-17 LAB — GLUCOSE, CAPILLARY: Glucose-Capillary: 272 mg/dL — ABNORMAL HIGH (ref 70–99)

## 2018-08-17 MED ORDER — VANCOMYCIN HCL IN DEXTROSE 750-5 MG/150ML-% IV SOLN
750.0000 mg | INTRAVENOUS | Status: DC
  Administered 2018-08-19: 750 mg via INTRAVENOUS
  Filled 2018-08-17: qty 150

## 2018-08-17 MED ORDER — COLLAGENASE 250 UNIT/GM EX OINT
1.0000 "application " | TOPICAL_OINTMENT | Freq: Every day | CUTANEOUS | Status: DC
  Filled 2018-08-17: qty 30

## 2018-08-17 MED ORDER — SEVELAMER CARBONATE 800 MG PO TABS
3200.0000 mg | ORAL_TABLET | Freq: Two times a day (BID) | ORAL | Status: DC
  Administered 2018-08-18 – 2018-08-22 (×7): 3200 mg via ORAL
  Filled 2018-08-17 (×10): qty 4

## 2018-08-17 MED ORDER — ACETAMINOPHEN 325 MG PO TABS: 650.0000 mg | ORAL_TABLET | Freq: Four times a day (QID) | ORAL | Status: DC | PRN

## 2018-08-17 MED ORDER — ONDANSETRON 4 MG PO TBDP: 4.0000 mg | ORAL_TABLET | Freq: Three times a day (TID) | ORAL | Status: DC | PRN

## 2018-08-17 MED ORDER — MORPHINE SULFATE (PF) 2 MG/ML IV SOLN
1.0000 mg | INTRAVENOUS | Status: DC | PRN
  Administered 2018-08-18 – 2018-08-25 (×30): 1 mg via INTRAVENOUS
  Filled 2018-08-17 (×31): qty 1

## 2018-08-17 MED ORDER — METOCLOPRAMIDE HCL 5 MG PO TABS
5.0000 mg | ORAL_TABLET | Freq: Three times a day (TID) | ORAL | Status: DC
  Administered 2018-08-18 – 2018-08-25 (×8): 5 mg via ORAL
  Filled 2018-08-17 (×15): qty 1

## 2018-08-17 MED ORDER — CINACALCET HCL 30 MG PO TABS
30.0000 mg | ORAL_TABLET | Freq: Every day | ORAL | Status: DC
  Administered 2018-08-18 – 2018-08-24 (×4): 30 mg via ORAL
  Filled 2018-08-17 (×7): qty 1

## 2018-08-17 MED ORDER — AMLODIPINE BESYLATE 10 MG PO TABS
10.0000 mg | ORAL_TABLET | Freq: Every day | ORAL | Status: DC
  Administered 2018-08-18 – 2018-08-25 (×5): 10 mg via ORAL
  Filled 2018-08-17 (×7): qty 1

## 2018-08-17 MED ORDER — HYDROCODONE-ACETAMINOPHEN 5-325 MG PO TABS
1.0000 | ORAL_TABLET | Freq: Four times a day (QID) | ORAL | Status: DC | PRN
  Administered 2018-08-17: 2 via ORAL
  Filled 2018-08-17: qty 2

## 2018-08-17 MED ORDER — CLONAZEPAM 0.5 MG PO TABS
0.5000 mg | ORAL_TABLET | Freq: Every day | ORAL | Status: DC | PRN
  Administered 2018-08-20 – 2018-08-25 (×5): 0.5 mg via ORAL
  Filled 2018-08-17 (×5): qty 1

## 2018-08-17 MED ORDER — VANCOMYCIN HCL 10 G IV SOLR
1500.0000 mg | Freq: Once | INTRAVENOUS | Status: AC
  Administered 2018-08-18: 1500 mg via INTRAVENOUS
  Filled 2018-08-17: qty 1500

## 2018-08-17 MED ORDER — PIPERACILLIN-TAZOBACTAM 3.375 G IVPB
3.3750 g | Freq: Two times a day (BID) | INTRAVENOUS | Status: DC
  Administered 2018-08-18 – 2018-08-22 (×10): 3.375 g via INTRAVENOUS
  Filled 2018-08-17 (×13): qty 50

## 2018-08-17 MED ORDER — INSULIN GLARGINE 100 UNIT/ML ~~LOC~~ SOLN
10.0000 [IU] | Freq: Every day | SUBCUTANEOUS | Status: DC
  Administered 2018-08-18 – 2018-08-20 (×4): 10 [IU] via SUBCUTANEOUS
  Administered 2018-08-22: 6 [IU] via SUBCUTANEOUS
  Filled 2018-08-17 (×6): qty 0.1

## 2018-08-17 NOTE — Progress Notes (Addendum)
Pharmacy Antibiotic Note  [name] is a 37 y.o. male admitted on 08/17/2018 with diabetic foot infection.  Pharmacy has been consulted for Vancomycin/Zosyn dosing. Direct admit from [facility]. Will need surgical intervention. Pt has ESRD on HD MWF.  Plan: Vancomycin 1500 mg IV x 1, then 750 mg IV qHD MWF Zosyn 3.375G IV q12h to be infused over 4 hours Trend WBC, temp, HD schedule F/U surgical plans Drug levels as indicated  Temp (24hrs), Avg:99.9 F (37.7 C), Min:99.9 F (37.7 C), Max:99.9 F (37.7 C)  No Known Allergies    [name] 08/17/2018 10:35 PM

## 2018-08-17 NOTE — H&P (Signed)
History and Physical    [name] QMV:[id number] DOB: October 31, 1980 DOA: 08/17/2018  PCP: Mauricia Area, MD Patient coming from: Podiatry office  Chief Complaint: Sent here for evaluation of diabetic foot ulcer  HPI: [name] is a 37 y.o. male with medical history significant of hypertension, ESRD on hemodialysis, uncontrolled type 1 diabetes with a chronic diabetic left foot ulcer being sent here to the hospital from podiatry office.  Patient was seen at the office today for acute changes to his left foot diabetic ulcer.  Noted to have a strong malodor with heavy drainage.  He was seen by Dr. Amalia Hailey who advised the patient to come into the hospital as a direct admit.  Patient will need surgical I&D and likely partial foot amputation.  He recommended starting IV antibiotics upon admission.  Dr. March Rummage will see the patient tomorrow.  Patient has been using Santyl on the wound.  Last clinic visit with Dr. March Rummage was on November 15; has been on clindamycin twice daily.  Patient states he noticed his left foot was smelling very bad 2 days ago.  He is also been experiencing pain in the foot.  Denies having any fevers or chills.  States he wants a regular diet to eat otherwise he will walk out of his room and go to the cafeteria to buy food.  Adamantly refused to eat carb modified or renal diet.  Reports having 8 out of 10 intensity pain in his left foot.  No further history could be obtained from the patient.  Review of Systems: As per HPI otherwise 10 point review of systems negative.  Past Medical History:  Diagnosis Date  . Diabetes mellitus without complication ([place])   . Diabetic gastroparesis ([place])   . Dialysis patient ([place])   . Hypertension   . Renal disorder    Dialysis  . Sepsis [facility])     Past Surgical History:  Procedure Laterality Date  . AMPUTATION Right 02/02/2018   Procedure: RIGHT FIFTH TOE AND METATARSAL AMPUTATION. Filetted toe flap metatarsal resection. Debridement  Plantar Foot wound;  Surgeon: Evelina Bucy, DPM;  Location: [place];  Service: Podiatry;  Laterality: Right;  . APPLICATION OF WOUND VAC  02/02/2018   Procedure: APPLICATION OF WOUND VAC  Right Foot;  Surgeon: Evelina Bucy, DPM;  Location: [place];  Service: Podiatry;;  . AV FISTULA PLACEMENT     left arm.  . AV FISTULA PLACEMENT Right 12/22/2016   Procedure: INSERTION OF ARTERIOVENOUS (AV) GORE-TEX GRAFT ARM;  Surgeon: Elam Dutch, MD;  Location: [facility] OR;  Service: Vascular;  Laterality: Right;  . AV FISTULA PLACEMENT Left 05/26/2018   Procedure: INSERTION OF  ARTERIOVENOUS (AV) GORE-TEX GRAFT LEFT ARM;  Surgeon: Serafina Mitchell, MD;  Location: [place];  Service: Vascular;  Laterality: Left;  . EYE SURGERY    . I&D EXTREMITY Right 10/31/2017   Procedure: IRRIGATION AND DEBRIDEMENT RIGHT FOOT;  Surgeon: Evelina Bucy, DPM;  Location: [place];  Service: Podiatry;  Laterality: Right;  . INSERTION OF DIALYSIS CATHETER     Right subclavian  . IR AV DIALY SHUNT INTRO NEEDLE/INTRACATH INITIAL W/PTA/IMG RIGHT Right 02/05/2018  . IR US GUIDE VASC ACCESS RIGHT  02/05/2018  . UPPER EXTREMITY VENOGRAPHY N/A 11/16/2016   Procedure: Upper Extremity Venography - Right Central;  Surgeon: Elam Dutch, MD;  Location: [place] CV LAB;  Service: Cardiovascular;  Laterality: N/A;  . UPPER EXTREMITY VENOGRAPHY N/A 05/25/2018   Procedure: UPPER EXTREMITY VENOGRAPHY -  Bilateral;  Surgeon: Marty Heck, MD;  Location: [place] CV LAB;  Service: Cardiovascular;  Laterality: N/A;     reports that he has never smoked. He has never used smokeless tobacco. He reports that he does not drink alcohol or use drugs.  No Known Allergies  Family History  Problem Relation Age of Onset  . Diabetes Mellitus II Unknown     Prior to Admission medications   Medication Sig Start Date End Date Taking? Authorizing Provider  acetaminophen (TYLENOL) 325 MG tablet Take 2 tablets (650 mg total) by mouth every 6  (six) hours as needed for mild pain or headache (pain/headache). Can restart this after you are done with Oxycodone/Tylenol 11/10/17   Domenic Polite, MD  amLODipine (NORVASC) 10 MG tablet Take 10 mg by mouth daily.    [provider]  cinacalcet (SENSIPAR) 30 MG tablet Take 30 mg by mouth daily with supper.    [provider]  clindamycin (CLEOCIN) 300 MG capsule Take 1 capsule (300 mg total) by mouth 2 (two) times daily. 08/11/18   Evelina Bucy, DPM  clonazePAM (KLONOPIN) 0.5 MG tablet Take 0.5 mg by mouth daily as needed for anxiety (sleep).  07/21/17   [provider]  cloNIDine (CATAPRES - DOSED IN MG/24 HR) 0.3 mg/24hr patch APPLY ONE PATCH TOPICALLY ONCE A WEEK 06/01/16   [provider]  collagenase (SANTYL) ointment Apply 1 application topically daily. 07/07/18   Evelina Bucy, DPM  glucose blood ([employer] TEST STRIPS) test strip by Other route 4 (four) times a day as needed. 10/29/16   [provider]  insulin aspart (NOVOLOG) 100 UNIT/ML injection Inject 5 Units into the skin 2 (two) times daily with a meal.     [provider]  insulin glargine (LANTUS) 100 UNIT/ML injection Inject 0.1 mLs (10 Units total) into the skin at bedtime. 02/05/18   Velna Ochs, MD  losartan (COZAAR) 50 MG tablet Take 50 mg by mouth daily. 09/27/17   [provider]  metoCLOPramide (REGLAN) 5 MG tablet TAKE ONE TABLET BY MOUTH THREE TIMES DAILY BEFORE MEAL(S) 12/08/15   [provider]  ondansetron (ZOFRAN ODT) 4 MG disintegrating tablet Take 1 tablet (4 mg total) by mouth every 8 (eight) hours as needed for nausea or vomiting. 11/23/17   Ward, Ozella Almond, PA-C  oxyCODONE-acetaminophen (PERCOCET/ROXICET) 5-325 MG tablet Take 1 tablet by mouth every 6 (six) hours as needed. 05/26/18   Ulyses Amor, PA-C  polyethylene glycol-electrolytes (NULYTELY/GOLYTELY) 420 g solution  08/15/18   [provider]  sevelamer carbonate  (RENVELA) 800 MG tablet Take 2,400-3,200 mg by mouth See admin instructions. Take 3 - 4 tablets (2400 mg -3200 mg) by mouth two times daily with meals    [provider]    Physical Exam: Vitals:   08/17/18 1950  BP: (!) 145/88  Pulse: 100  Resp: 18  Temp: 99.9 F (37.7 C)  TempSrc: Oral    Physical Exam  Constitutional: He is oriented to person, place, and time. He appears well-developed and well-nourished. No distress.  Resting comfortably in a hospital stretcher watching television  HENT:  Head: Normocephalic.  Mouth/Throat: Oropharynx is clear and moist.  Eyes: Right eye exhibits no discharge. Left eye exhibits no discharge.  Neck: Neck supple. No tracheal deviation present.  Cardiovascular: Normal rate, regular rhythm and intact distal pulses.  Pulmonary/Chest: Effort normal and breath sounds normal. No respiratory distress. He has no wheezes. He has no rales.  Abdominal:  Soft. Bowel sounds are normal. He exhibits no distension. There is no tenderness. There is no guarding.  Musculoskeletal: He exhibits no edema.  Left foot: Ulcer noted on the lateral aspect of the foot.  Extremely malodorous with eschar and purulent drainage.  Please see image. Right foot: S/p amputation of fifth toe  Neurological: He is alert and oriented to person, place, and time.  Skin: Skin is warm and dry. He is not diaphoretic.       Labs on Admission: I have personally reviewed following labs and imaging studies  CBC: Recent Labs  Lab 08/17/18 2227  WBC 15.3*  HGB 8.4*  HCT 27.3*  MCV 92.9  PLT 875   Basic Metabolic Panel: Recent Labs  Lab 08/17/18 2227  NA 133*  K 4.2  CL 94*  CO2 26  GLUCOSE 107*  BUN 44*  CREATININE 9.86*  CALCIUM 8.5*   GFR: CrCl cannot be calculated (Unknown ideal weight.). Liver Function Tests: No results for input(s): AST, ALT, ALKPHOS, BILITOT, PROT, ALBUMIN in the last 168 hours. No results for input(s): LIPASE, AMYLASE in the last 168  hours. No results for input(s): AMMONIA in the last 168 hours. Coagulation Profile: No results for input(s): INR, PROTIME in the last 168 hours. Cardiac Enzymes: No results for input(s): CKTOTAL, CKMB, CKMBINDEX, TROPONINI in the last 168 hours. BNP (last 3 results) No results for input(s): PROBNP in the last 8760 hours. HbA1C: No results for input(s): HGBA1C in the last 72 hours. CBG: Recent Labs  Lab 08/17/18 1948  GLUCAP 272*   Lipid Profile: No results for input(s): CHOL, HDL, LDLCALC, TRIG, CHOLHDL, LDLDIRECT in the last 72 hours. Thyroid Function Tests: No results for input(s): TSH, T4TOTAL, FREET4, T3FREE, THYROIDAB in the last 72 hours. Anemia Panel: No results for input(s): VITAMINB12, FOLATE, FERRITIN, TIBC, IRON, RETICCTPCT in the last 72 hours. Urine analysis:    Component Value Date/Time   COLORURINE YELLOW 05/26/2015 1115   APPEARANCEUR CLEAR 05/26/2015 1115   APPEARANCEUR Hazy 07/30/2014 2303   LABSPEC 1.016 05/26/2015 1115   LABSPEC 1.012 07/30/2014 2303   PHURINE 8.0 05/26/2015 1115   GLUCOSEU 500 (A) 05/26/2015 1115   GLUCOSEU >=500 07/30/2014 2303   HGBUR MODERATE (A) 05/26/2015 1115   BILIRUBINUR NEGATIVE 05/26/2015 1115   BILIRUBINUR Negative 07/30/2014 2303   KETONESUR 15 (A) 05/26/2015 1115   PROTEINUR >300 (A) 05/26/2015 1115   UROBILINOGEN 0.2 05/26/2015 1115   NITRITE NEGATIVE 05/26/2015 1115   LEUKOCYTESUR NEGATIVE 05/26/2015 1115   LEUKOCYTESUR Negative 07/30/2014 2303    Radiological Exams on Admission: No results found.  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  White count 15.3.  No signs of sepsis. -Seen at podiatry office  today.  Dr. Ellard Artis thinks he will need surgical I&D and likely partial foot amputation.  He recommended starting IV antibiotics upon admission.  Dr. March Rummage will see the patient tomorrow.   -Broad-spectrum antibiotics: Vancomycin and Zosyn -Wound care consult  -Continue using Santyl on the wound  -Morphine 1 mg every 4 hours as needed for pain, Tylenol as needed -MRI of left foot -Repeat CBC in a.m. -N.p.o. after midnight  Hypertension -Blood pressure slightly elevated. -Continue home amlodipine. -Clonidine  weekly patch mentioned in home medications, unclear when the last patch was applied. -Hold home losartan at this time  ESRD on hemodialysis MWF -Consult nephrology during this hospitalization for hemodialysis.  Last dialysis session was today. -Continue home renal supplements -Renal function panel in a.m.  Anemia of chronic disease -Hemoglobin 8.4, was 12.2 two months ago.  No signs of active bleeding. -Recommendations per nephrology  Uncontrolled type 1 diabetes -CBG 272 on admission.  Patient adamantly refuses to eat a carb modified diet.  -Check A1c -Lantus 10 units at bedtime -Sliding scale insulin sensitive -CBG checks  Diabetic gastroparesis -Continue home Reglan, Zofran PRN  Patient stated he has an appointment with GI (Dr. Keene Breath) at [facility] at 6:30 AM for a colonoscopy.  Phone number [phone].  I tried calling the number but the operator informed me she does not have any way of passing along my message to GI as the office is currently closed.  Please call the office in the morning to inform them that the patient is currently hospitalized and the colonoscopy needs to be rescheduled.  DVT prophylaxis: SCDs Code Status: Full code Family Communication: No family available. Disposition Plan: Anticipate discharge to home in 2 to 3 days. Consults called: Podiatry (Dr. Amalia Hailey) Admission status: It is my clinical opinion that admission to [facility] is  reasonable and necessary in this 37 y.o. male . presenting with symptoms of malodorous diabetic foot ulcer, concerning for infected diabetic foot ulcer with gas gangrene . in the context of PMH including: Uncontrolled type 1 diabetes . with pertinent positives on physical exam including: Extremely malodorous diabetic foot ulcer with eschar and purulent drainage . and pertinent positives on radiographic and laboratory data including: Leukocytosis . Workup and treatment include IV antibiotics.  Patient will undergo I&D and likely partial foot amputation during this hospitalization.  Given the aforementioned, the predictability of an adverse outcome is felt to be significant. I expect that the patient will require at least 2 midnights in the hospital to treat this condition.   Shela Leff MD [facility] Pager [phone]  If 7PM-7AM, please contact night-coverage [email or web address] Password [email or web address]  08/18/2018, 2:35 AM

## 2018-08-17 NOTE — Progress Notes (Signed)
Subjective:  Patient ID: [name], male    DOB: 1980-12-31,  MRN: [id number]  Chief Complaint  Patient presents with  . Foot Ulcer    diabetic left midfoot, f/u appt.    37 y.o. male presents with the above complaint. States he has not been wearing his surgical shoe, states that he thought he was supposed to only wear it when not around the house.  Review of Systems: Negative except as noted in the HPI. Denies N/V/F/Ch.  Past Medical History:  Diagnosis Date  . Diabetes mellitus without complication ([place])   . Diabetic gastroparesis ([place])   . Dialysis patient ([place])   . Hypertension   . Renal disorder    Dialysis  . Sepsis ([place])     Current Outpatient Medications:  .  acetaminophen (TYLENOL) 325 MG tablet, Take 2 tablets (650 mg total) by mouth every 6 (six) hours as needed for mild pain or headache (pain/headache). Can restart this after you are done with Oxycodone/Tylenol, Disp: , Rfl:  .  amLODipine (NORVASC) 10 MG tablet, Take 10 mg by mouth daily., Disp: , Rfl:  .  cinacalcet (SENSIPAR) 30 MG tablet, Take 30 mg by mouth daily with supper., Disp: , Rfl:  .  clonazePAM (KLONOPIN) 0.5 MG tablet, Take 0.5 mg by mouth daily as needed for anxiety (sleep). , Disp: , Rfl:  .  cloNIDine (CATAPRES - DOSED IN MG/24 HR) 0.3 mg/24hr patch, APPLY ONE PATCH TOPICALLY ONCE A WEEK, Disp: , Rfl:  .  collagenase (SANTYL) ointment, Apply 1 application topically daily., Disp: 15 g, Rfl: 5 .  glucose blood ([employer] TEST STRIPS) test strip, by Other route 4 (four) times a day as needed., Disp: , Rfl:  .  insulin aspart (NOVOLOG) 100 UNIT/ML injection, Inject 5 Units into the skin 2 (two) times daily with a meal. , Disp: , Rfl:  .  insulin glargine (LANTUS) 100 UNIT/ML injection, Inject 0.1 mLs (10 Units total) into the skin at bedtime., Disp: 10 mL, Rfl: 1 .  losartan (COZAAR) 50 MG tablet, Take 50 mg by mouth daily., Disp: , Rfl:  .  metoCLOPramide (REGLAN) 5 MG tablet, TAKE ONE TABLET BY MOUTH  THREE TIMES DAILY BEFORE MEAL(S), Disp: , Rfl:  .  ondansetron (ZOFRAN ODT) 4 MG disintegrating tablet, Take 1 tablet (4 mg total) by mouth every 8 (eight) hours as needed for nausea or vomiting., Disp: 10 tablet, Rfl: 0 .  oxyCODONE-acetaminophen (PERCOCET/ROXICET) 5-325 MG tablet, Take 1 tablet by mouth every 6 (six) hours as needed., Disp: 10 tablet, Rfl: 0 .  sevelamer carbonate (RENVELA) 800 MG tablet, Take 2,400-3,200 mg by mouth See admin instructions. Take 3 - 4 tablets (2400 mg -3200 mg) by mouth two times daily with meals, Disp: , Rfl:  .  clindamycin (CLEOCIN) 300 MG capsule, Take 1 capsule (300 mg total) by mouth 2 (two) times daily., Disp: 14 capsule, Rfl: 0 .  polyethylene glycol-electrolytes (NULYTELY/GOLYTELY) 420 g solution, , Disp: , Rfl:   Social History   Tobacco Use  Smoking Status Never Smoker  Smokeless Tobacco Never Used    No Known Allergies Objective:  There were no vitals filed for this visit. There is no height or weight on file to calculate BMI. Constitutional Well developed. Well nourished.  Vascular Dorsalis pedis pulses palpable bilaterally. Posterior tibial pulses palpable bilaterally. Capillary refill normal to all digits.  No cyanosis or clubbing noted. Pedal hair growth normal.  Neurologic Normal speech. Oriented to person, place, and time. Epicritic  sensation to light touch grossly present bilaterally.  Dermatologic Nails well groomed and normal in appearance. No skin lesions.  Ulceration plantar left foot 3x2 pre-debridement. Joint capsule exposed, no exposed bone noted. Slight local warmth and erythema, no purulence.  Orthopedic: Normal joint ROM without pain or crepitus bilaterally. No visible deformities. 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    Radiographs: Taken reviewed no underlying osseous erosions noted.  Assessment:   1. Diabetic ulcer of left midfoot associated with diabetes mellitus due to  underlying condition, limited to breakdown of skin [facility])    Plan:  Patient was evaluated and treated and all questions answered.  Ulceration left fifth MPJ -Debrided as below -Continue Santyl -Dressed with Betadine DSD.  -X-rays taken again due to the worsening of the ulceration again to exposed joint capsule. No osseous erosions noted. It was discussed with the patient that due to the worsening of the wound I recommend we proceed with elective 5th metatarsal head excision at this time. I recommended proceeding with this procedure next week. I discussed that proceeding as such should help the ulceration heal, prevent worsening, and prevent the possibility that the ulcer could worsen and lead to possible amputation of the fifth toe. Patient states he does not want to go that route. I discussed with patient that I strongly disagree and I stressed that not proceeding could lead to possible amputation of the 5th toe and metatarsal in the future. Patient verbalized understanding. -Rx Clindamycin for slight erythema.  Procedure: Excisional Debridement of Wound Rationale: Removal of non-viable soft tissue from the wound to promote healing.  Anesthesia: none Pre-Debridement Wound Measurements: 3 cm x 2 cm x 1 cm  Post-Debridement Wound Measurements: 4 cm x 4 cm x 1 cm  Type of Debridement: Sharp Excisional Tissue Removed: Non-viable soft tissue Depth of Debridement: subcutaneous tissue. Technique: Sharp excisional debridement to bleeding, viable wound base.  Dressing: Dry, sterile, compression dressing. Disposition: Patient tolerated procedure well. Patient to return in 1 week for follow-up.  Ulceration plantar right forefoot -Healed. No debridement today. -Continue surgical shoe until we get his DM shoes.  Return in about 1 week (around 08/18/2018) for Wound Care, Left.

## 2018-08-17 NOTE — Progress Notes (Signed)
HPI: 37 year old male end-stage renal disease on dialysis, uncontrolled diabetes mellitus with a chronic diabetic foot ulcer left foot that is currently being managed by Dr. March Rummage here in the office presents today for acute changes to the ulceration site.  Patient noticed a strong malodor with heavy drainage.  Patient concerned about infection.  Patient is obviously under a significant amount of stress and very irritable presentation today.  Patient was last seen here in the office on 08/11/2018.  Patient has been wearing a postoperative shoe and been applying Santyl to the ulceration site.  Past Medical History:  Diagnosis Date  . Diabetes mellitus without complication ([place])   . Diabetic gastroparesis ([place])   . Dialysis patient ([place])   . Hypertension   . Renal disorder    Dialysis  . Sepsis [facility])      Patient Active Problem List   Diagnosis Date Noted  . ESRD (end stage renal disease) ([place]) 05/23/2018  . Hyperkalemia   . 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 Infected skin ulcer with necrosis of bone ([place])   . Osteomyelitis ([place]) 02/01/2018  . Delirium   . Altered mental status   . Ulcer of right foot limited to breakdown of skin ([place])   . Osteomyelitis of ankle or foot, acute, right ([place]) 10/30/2017  . Abscess or cellulitis of toe, right   . Cellulitis of right lower extremity   . Right foot infection   . Diabetic foot infection ([place])   . Cellulitis of right foot 10/26/2017  . Diabetes mellitus type 1 ([place]) 10/25/2017  . Diabetic gastroparesis ([place]) 10/25/2017  . Hemodialysis patient ([place]) 10/25/2017  . Sepsis ([place]) 10/25/2017  . DKA (diabetic ketoacidoses) ([place]) 05/27/2015  . Essential (primary) hypertension 08/09/2014  . ESRD (end stage renal disease) on dialysis ([place]) 03/19/2014  . Pain, abdominal, generalized 03/19/2014  . Bacteremia due to coagulase-negative Staphylococcus 05/31/2013  . Leukocytosis 05/31/2013  . Hyponatremia 05/29/2013  . Metabolic acidosis  27/25/3664    Physical Exam: General: The patient is alert and oriented x3 in no acute distress.  Dermatology: Ulcer noted to the sub-fifth MTPJ left foot with 100% necrotic debris throughout the entire ulcer site.  Ulcer measures approximately 3.53.5 (LxW) and is currently unstageable.  Heavy drainage noted.  Strong malodor noted consistent with gas gangrene.  Periwound is macerated with erythema and edema noted throughout the lateral portion of the foot.  Vascular: Edema noted to the fifth toe left foot.  Erythema with edema and warmth noted to the left foot.  Neurological: Epicritic and protective threshold absent bilaterally.   Musculoskeletal Exam: Diabetic foot ulcer left foot, unstageable  Diabetic ulcer of left midfoot associated with diabetes mellitus due to underlying condition, limited to breakdown of skin ([name])  PAD (peripheral artery disease) ([name])  Type 1 DM with hypertension and ESRD on dialysis [facility]  History of amputation of lesser toe of right foot ([place])  Cellulitis of left foot  Gas gangrene of foot ([place])    Assessment: 1. Diabetic foot ulcer left, with gas gangrene 2. Cellulitis left foot 3. ESRD  4.  Diabetes mellitus, uncontrolled   Plan of Care:  1. Patient evaluated.  2.  I explained to the patient that he needs emergent care in the hospital.  Patient is obviously under a significant amount of stress given the circumstances.  We are going to try and directly admit the patient.  Patient will need surgical incision and drainage and likely partial foot amputation.  Recommend  IV antibiotics upon admission. 3.  Once admitted I will have Dr. Hardie Pulley, podiatry, informed of the patient's situation.  Please consult podiatry, Dr. Hardie Pulley, upon admission for surgical I&D/partial foot amputation left foot.       Edrick Kins, DPM [facility]  Dr. Edrick Kins, DPM    [address], [name] [address]                Office [phone]  Fax [phone]

## 2018-08-18 ENCOUNTER — Encounter (HOSPITAL_COMMUNITY): Payer: Self-pay | Admitting: *Deleted

## 2018-08-18 ENCOUNTER — Inpatient Hospital Stay (HOSPITAL_COMMUNITY): Payer: Medicare Other | Admitting: Certified Registered Nurse Anesthetist

## 2018-08-18 ENCOUNTER — Inpatient Hospital Stay (HOSPITAL_COMMUNITY): Payer: Medicare Other

## 2018-08-18 ENCOUNTER — Ambulatory Visit: Payer: Medicare Other | Admitting: Podiatry

## 2018-08-18 ENCOUNTER — Other Ambulatory Visit: Payer: Self-pay

## 2018-08-18 ENCOUNTER — Encounter (HOSPITAL_COMMUNITY)
Admission: AD | Disposition: A | Discharge status: Home Health | Payer: Self-pay | Source: Home / Self Care | Attending: Internal Medicine

## 2018-08-18 DIAGNOSIS — K3184 Gastroparesis: Secondary | ICD-10-CM

## 2018-08-18 DIAGNOSIS — E1143 Type 2 diabetes mellitus with diabetic autonomic (poly)neuropathy: Secondary | ICD-10-CM

## 2018-08-18 DIAGNOSIS — D638 Anemia in other chronic diseases classified elsewhere: Secondary | ICD-10-CM

## 2018-08-18 HISTORY — PX: TRANSMETATARSAL AMPUTATION: SHX6197

## 2018-08-18 LAB — RENAL FUNCTION PANEL
Albumin: 2.6 g/dL — ABNORMAL LOW (ref 3.5–5.0)
Anion gap: 14 (ref 5–15)
BUN: 47 mg/dL — ABNORMAL HIGH (ref 6–20)
CO2: 25 mmol/L (ref 22–32)
Calcium: 8.8 mg/dL — ABNORMAL LOW (ref 8.9–10.3)
Chloride: 92 mmol/L — ABNORMAL LOW (ref 98–111)
Creatinine, Ser: 10.45 mg/dL — ABNORMAL HIGH (ref 0.61–1.24)
GFR calc Af Amer: 6 mL/min — ABNORMAL LOW (ref >60)
GFR calc non Af Amer: 6 mL/min — ABNORMAL LOW (ref >60)
Glucose, Bld: 188 mg/dL — ABNORMAL HIGH (ref 70–99)
Phosphorus: 4.9 mg/dL — ABNORMAL HIGH (ref 2.5–4.6)
Potassium: 4.7 mmol/L (ref 3.5–5.1)
Sodium: 131 mmol/L — ABNORMAL LOW (ref 135–145)

## 2018-08-18 LAB — GLUCOSE, CAPILLARY
Glucose-Capillary: 146 mg/dL — ABNORMAL HIGH (ref 70–99)
Glucose-Capillary: 156 mg/dL — ABNORMAL HIGH (ref 70–99)
Glucose-Capillary: 160 mg/dL — ABNORMAL HIGH (ref 70–99)
Glucose-Capillary: 186 mg/dL — ABNORMAL HIGH (ref 70–99)
Glucose-Capillary: 213 mg/dL — ABNORMAL HIGH (ref 70–99)
Glucose-Capillary: 234 mg/dL — ABNORMAL HIGH (ref 70–99)
Glucose-Capillary: 362 mg/dL — ABNORMAL HIGH (ref 70–99)
Glucose-Capillary: 476 mg/dL — ABNORMAL HIGH (ref 70–99)

## 2018-08-18 LAB — CBC
HCT: 29.8 % — ABNORMAL LOW (ref 39.0–52.0)
Hemoglobin: 9.3 g/dL — ABNORMAL LOW (ref 13.0–17.0)
MCH: 28.9 pg (ref 26.0–34.0)
MCHC: 31.2 g/dL (ref 30.0–36.0)
MCV: 92.5 fL (ref 80.0–100.0)
Platelets: 261 10*3/uL (ref 150–400)
RBC: 3.22 MIL/uL — ABNORMAL LOW (ref 4.22–5.81)
RDW: 14.1 % (ref 11.5–15.5)
WBC: 14 10*3/uL — ABNORMAL HIGH (ref 4.0–10.5)
nRBC: 0 % (ref 0.0–0.2)

## 2018-08-18 LAB — C-REACTIVE PROTEIN: CRP: 15.8 mg/dL — ABNORMAL HIGH (ref <1.0)

## 2018-08-18 LAB — SURGICAL PCR SCREEN
MRSA, PCR: NEGATIVE
Staphylococcus aureus: NEGATIVE

## 2018-08-18 LAB — MRSA PCR SCREENING: MRSA by PCR: NEGATIVE

## 2018-08-18 LAB — SEDIMENTATION RATE: Sed Rate: 78 mm/hr — ABNORMAL HIGH (ref 0–16)

## 2018-08-18 LAB — HEMOGLOBIN A1C
Hgb A1c MFr Bld: 7.3 % — ABNORMAL HIGH (ref 4.8–5.6)
Mean Plasma Glucose: 162.81 mg/dL

## 2018-08-18 SURGERY — AMPUTATION, FOOT, TRANSMETATARSAL
Anesthesia: General

## 2018-08-18 MED ORDER — MEPERIDINE HCL 50 MG/ML IJ SOLN: 6.2500 mg | INTRAMUSCULAR | Status: DC | PRN

## 2018-08-18 MED ORDER — PHENYLEPHRINE 40 MCG/ML (10ML) SYRINGE FOR IV PUSH (FOR BLOOD PRESSURE SUPPORT)
PREFILLED_SYRINGE | INTRAVENOUS | Status: AC
  Filled 2018-08-18: qty 20

## 2018-08-18 MED ORDER — ONDANSETRON HCL 4 MG/2ML IJ SOLN
INTRAMUSCULAR | Status: DC | PRN
  Administered 2018-08-18: 4 mg via INTRAVENOUS

## 2018-08-18 MED ORDER — LIDOCAINE 2% (20 MG/ML) 5 ML SYRINGE
INTRAMUSCULAR | Status: AC
  Filled 2018-08-18: qty 5

## 2018-08-18 MED ORDER — SODIUM CHLORIDE 0.9 % IR SOLN
Status: DC | PRN
  Administered 2018-08-18: 3000 mL

## 2018-08-18 MED ORDER — VANCOMYCIN HCL 500 MG IV SOLR
INTRAVENOUS | Status: AC
  Filled 2018-08-18: qty 500

## 2018-08-18 MED ORDER — LIDOCAINE 2% (20 MG/ML) 5 ML SYRINGE
INTRAMUSCULAR | Status: DC | PRN
  Administered 2018-08-18: 100 mg via INTRAVENOUS

## 2018-08-18 MED ORDER — DARBEPOETIN ALFA 40 MCG/0.4ML IJ SOSY
40.0000 ug | PREFILLED_SYRINGE | INTRAMUSCULAR | Status: DC
  Administered 2018-08-19: 40 ug via INTRAVENOUS
  Filled 2018-08-18: qty 0.4

## 2018-08-18 MED ORDER — HYDROCODONE-ACETAMINOPHEN 7.5-325 MG PO TABS: 1.0000 | ORAL_TABLET | Freq: Once | ORAL | Status: DC | PRN

## 2018-08-18 MED ORDER — PHENYLEPHRINE 40 MCG/ML (10ML) SYRINGE FOR IV PUSH (FOR BLOOD PRESSURE SUPPORT)
PREFILLED_SYRINGE | INTRAVENOUS | Status: DC | PRN
  Administered 2018-08-18 (×2): 80 ug via INTRAVENOUS
  Administered 2018-08-18: 8 ug via INTRAVENOUS
  Administered 2018-08-18 (×2): 80 ug via INTRAVENOUS

## 2018-08-18 MED ORDER — PROPOFOL 1000 MG/100ML IV EMUL
INTRAVENOUS | Status: AC
  Filled 2018-08-18: qty 100

## 2018-08-18 MED ORDER — INSULIN ASPART 100 UNIT/ML ~~LOC~~ SOLN
3.0000 [IU] | Freq: Three times a day (TID) | SUBCUTANEOUS | Status: DC
  Administered 2018-08-18: 3 [IU] via SUBCUTANEOUS

## 2018-08-18 MED ORDER — FENTANYL CITRATE (PF) 250 MCG/5ML IJ SOLN
INTRAMUSCULAR | Status: AC
  Filled 2018-08-18: qty 5

## 2018-08-18 MED ORDER — FENTANYL CITRATE (PF) 100 MCG/2ML IJ SOLN
INTRAMUSCULAR | Status: DC | PRN
  Administered 2018-08-18 (×2): 25 ug via INTRAVENOUS
  Administered 2018-08-18: 50 ug via INTRAVENOUS

## 2018-08-18 MED ORDER — DEXAMETHASONE SODIUM PHOSPHATE 10 MG/ML IJ SOLN
INTRAMUSCULAR | Status: DC | PRN
  Administered 2018-08-18: 4 mg via INTRAVENOUS

## 2018-08-18 MED ORDER — COLLAGENASE 250 UNIT/GM EX OINT
1.0000 "application " | TOPICAL_OINTMENT | Freq: Every day | CUTANEOUS | Status: DC
  Administered 2018-08-18 – 2018-08-25 (×2): 1 via TOPICAL
  Filled 2018-08-18: qty 30

## 2018-08-18 MED ORDER — BUPIVACAINE HCL (PF) 0.5 % IJ SOLN
INTRAMUSCULAR | Status: AC
  Filled 2018-08-18: qty 30

## 2018-08-18 MED ORDER — INSULIN ASPART 100 UNIT/ML ~~LOC~~ SOLN
0.0000 [IU] | Freq: Every day | SUBCUTANEOUS | Status: DC
  Administered 2018-08-18 – 2018-08-19 (×2): 5 [IU] via SUBCUTANEOUS
  Administered 2018-08-20: 2 [IU] via SUBCUTANEOUS
  Administered 2018-08-21: 5 [IU] via SUBCUTANEOUS

## 2018-08-18 MED ORDER — SODIUM CHLORIDE 0.9 % IV SOLN
INTRAVENOUS | Status: DC
  Administered 2018-08-18 (×2): via INTRAVENOUS
  Administered 2018-08-18: 10 mL/h via INTRAVENOUS
  Administered 2018-08-20: 10:00:00 via INTRAVENOUS

## 2018-08-18 MED ORDER — PROPOFOL 10 MG/ML IV BOLUS
INTRAVENOUS | Status: DC | PRN
  Administered 2018-08-18: 160 mg via INTRAVENOUS

## 2018-08-18 MED ORDER — INSULIN ASPART 100 UNIT/ML ~~LOC~~ SOLN
0.0000 [IU] | Freq: Three times a day (TID) | SUBCUTANEOUS | Status: DC
  Administered 2018-08-19: 3 [IU] via SUBCUTANEOUS
  Administered 2018-08-19: 5 [IU] via SUBCUTANEOUS
  Administered 2018-08-20: 2 [IU] via SUBCUTANEOUS
  Administered 2018-08-21: 1 [IU] via SUBCUTANEOUS
  Administered 2018-08-21 – 2018-08-22 (×2): 5 [IU] via SUBCUTANEOUS
  Administered 2018-08-22 (×2): 3 [IU] via SUBCUTANEOUS
  Administered 2018-08-23: 1 [IU] via SUBCUTANEOUS
  Administered 2018-08-23: 3 [IU] via SUBCUTANEOUS
  Administered 2018-08-23 – 2018-08-24 (×2): 2 [IU] via SUBCUTANEOUS
  Administered 2018-08-25: 3 [IU] via SUBCUTANEOUS
  Administered 2018-08-25: 5 [IU] via SUBCUTANEOUS

## 2018-08-18 MED ORDER — VANCOMYCIN HCL 500 MG IV SOLR
INTRAVENOUS | Status: DC | PRN
  Administered 2018-08-18: 500 mg

## 2018-08-18 MED ORDER — INSULIN ASPART 100 UNIT/ML ~~LOC~~ SOLN
0.0000 [IU] | Freq: Three times a day (TID) | SUBCUTANEOUS | Status: DC
  Administered 2018-08-18: 3 [IU] via SUBCUTANEOUS
  Administered 2018-08-18: 2 [IU] via SUBCUTANEOUS
  Administered 2018-08-18: 3 [IU] via SUBCUTANEOUS

## 2018-08-18 MED ORDER — ACETAMINOPHEN 10 MG/ML IV SOLN: 1000.0000 mg | Freq: Once | INTRAVENOUS | Status: DC | PRN

## 2018-08-18 MED ORDER — 0.9 % SODIUM CHLORIDE (POUR BTL) OPTIME
TOPICAL | Status: DC | PRN
  Administered 2018-08-18: 1000 mL

## 2018-08-18 MED ORDER — HYDROMORPHONE HCL 1 MG/ML IJ SOLN: 0.2500 mg | INTRAMUSCULAR | Status: DC | PRN

## 2018-08-18 MED ORDER — MIDAZOLAM HCL 5 MG/5ML IJ SOLN
INTRAMUSCULAR | Status: DC | PRN
  Administered 2018-08-18: 2 mg via INTRAVENOUS

## 2018-08-18 MED ORDER — ROCURONIUM BROMIDE 50 MG/5ML IV SOSY
PREFILLED_SYRINGE | INTRAVENOUS | Status: AC
  Filled 2018-08-18: qty 5

## 2018-08-18 MED ORDER — CALCITRIOL 0.5 MCG PO CAPS
2.2500 ug | ORAL_CAPSULE | ORAL | Status: DC
  Administered 2018-08-19 – 2018-08-24 (×2): 2.25 ug via ORAL
  Filled 2018-08-18: qty 1

## 2018-08-18 MED ORDER — BUPIVACAINE HCL 0.5 % IJ SOLN
INTRAMUSCULAR | Status: DC | PRN
  Administered 2018-08-18: 10 mL

## 2018-08-18 MED ORDER — PHENYLEPHRINE HCL 10 MG/ML IJ SOLN
INTRAMUSCULAR | Status: AC
  Filled 2018-08-18: qty 1

## 2018-08-18 MED ORDER — SODIUM CHLORIDE 0.9 % IV SOLN
INTRAVENOUS | Status: DC | PRN
  Administered 2018-08-18: 30 ug/min via INTRAVENOUS

## 2018-08-18 MED ORDER — PROMETHAZINE HCL 25 MG/ML IJ SOLN: 6.2500 mg | INTRAMUSCULAR | Status: DC | PRN

## 2018-08-18 MED ORDER — MIDAZOLAM HCL 2 MG/2ML IJ SOLN
INTRAMUSCULAR | Status: AC
  Filled 2018-08-18: qty 2

## 2018-08-18 SURGICAL SUPPLY — 36 items
BANDAGE ACE 4X5 VEL STRL LF (GAUZE/BANDAGES/DRESSINGS) ×3 IMPLANT
BNDG GAUZE ELAST 4 BULKY (GAUZE/BANDAGES/DRESSINGS) ×3 IMPLANT
CANISTER WOUND CARE 500ML ATS (WOUND CARE) ×3 IMPLANT
CASSETTE VERAFLO VERALINK (MISCELLANEOUS) ×3 IMPLANT
COVER SURGICAL LIGHT HANDLE (MISCELLANEOUS) ×3 IMPLANT
COVER WAND RF STERILE (DRAPES) ×3 IMPLANT
DRAPE STERI IOBAN 125X83 (DRAPES) ×3 IMPLANT
DRESSING VERAFLO CLEANSE CC (GAUZE/BANDAGES/DRESSINGS) ×1 IMPLANT
DRSG EMULSION OIL 3X3 NADH (GAUZE/BANDAGES/DRESSINGS) ×3 IMPLANT
DRSG VERAFLO CLEANSE CC (GAUZE/BANDAGES/DRESSINGS) ×3
ELECT CAUTERY BLADE 6.4 (BLADE) ×3 IMPLANT
ELECT REM PT RETURN 9FT ADLT (ELECTROSURGICAL) ×3
ELECTRODE REM PT RTRN 9FT ADLT (ELECTROSURGICAL) ×1 IMPLANT
GAUZE SPONGE 4X4 12PLY STRL (GAUZE/BANDAGES/DRESSINGS) ×3 IMPLANT
GLOVE BIO SURGEON STRL SZ7.5 (GLOVE) ×3 IMPLANT
GLOVE BIOGEL PI IND STRL 8 (GLOVE) ×1 IMPLANT
GLOVE BIOGEL PI INDICATOR 8 (GLOVE) ×2
GOWN STRL REUS W/ TWL LRG LVL3 (GOWN DISPOSABLE) ×1 IMPLANT
GOWN STRL REUS W/ TWL XL LVL3 (GOWN DISPOSABLE) ×1 IMPLANT
GOWN STRL REUS W/TWL LRG LVL3 (GOWN DISPOSABLE) ×2
GOWN STRL REUS W/TWL XL LVL3 (GOWN DISPOSABLE) ×2
KIT BASIN OR (CUSTOM PROCEDURE TRAY) ×3 IMPLANT
KIT TURNOVER KIT B (KITS) ×3 IMPLANT
NEEDLE HYPO 25GX1X1/2 BEV (NEEDLE) ×3 IMPLANT
NS IRRIG 1000ML POUR BTL (IV SOLUTION) ×3 IMPLANT
PACK ORTHO EXTREMITY (CUSTOM PROCEDURE TRAY) ×3 IMPLANT
PAD ARMBOARD 7.5X6 YLW CONV (MISCELLANEOUS) ×3 IMPLANT
PAD CAST 4YDX4 CTTN HI CHSV (CAST SUPPLIES) ×1 IMPLANT
PADDING CAST COTTON 4X4 STRL (CAST SUPPLIES) ×2
SOL PREP POV-IOD 4OZ 10% (MISCELLANEOUS) ×3 IMPLANT
SUT ETHILON 3 0 PS 1 (SUTURE) ×3 IMPLANT
SYR CONTROL 10ML LL (SYRINGE) ×3 IMPLANT
TOWEL OR 17X26 10 PK STRL BLUE (TOWEL DISPOSABLE) ×3 IMPLANT
TUBE CONNECTING 12'X1/4 (SUCTIONS) ×1
TUBE CONNECTING 12X1/4 (SUCTIONS) ×2 IMPLANT
YANKAUER SUCT BULB TIP NO VENT (SUCTIONS) ×3 IMPLANT

## 2018-08-18 NOTE — Consult Note (Signed)
WOC consult for left foot requested prior to podiatry service involvement; they are now following for assessment and plan of care and plan to take pt to the OR for surgery, according to the progress notes.  Please refer to their team for further questions. Please re-consult if further assistance is needed.  Thank-you,  Julien Girt MSN, [place], [place], [place], [place]

## 2018-08-18 NOTE — Progress Notes (Signed)
Patient's CBG=364.Pt only gets 10 units lantus. [facility] notified for pt's req for novolog coverage. Order received.

## 2018-08-18 NOTE — Anesthesia Postprocedure Evaluation (Signed)
Anesthesia Post Note  Patient: [name]  Procedure(s) Performed: IRRIGATION AND DEBRIDEMENT OF LEFT 5TH TOE AND TRANSMETATARSAL, WITH PARTICAL LEFT 5TH TOE AND METATARSAL AMPUTATION, BONE BIOPSY, WOUND VAC APPLICATION. (N/A )     Patient location during evaluation: PACU Anesthesia Type: General Level of consciousness: awake and alert Pain management: pain level controlled Vital Signs Assessment: post-procedure vital signs reviewed and stable Respiratory status: spontaneous breathing, nonlabored ventilation, respiratory function stable and patient connected to nasal cannula oxygen Cardiovascular status: blood pressure returned to baseline and stable Postop Assessment: no apparent nausea or vomiting Anesthetic complications: no    Last Vitals:  Vitals:   08/18/18 1438 08/18/18 1551  BP: 112/66 127/87  Pulse: 83 90  Resp: 16 16  Temp: 36.7 C   SpO2: 99% (!) 81%    Last Pain:  Vitals:   08/18/18 1438  TempSrc:   PainSc: Tyler Deis

## 2018-08-18 NOTE — Consult Note (Signed)
Reason for Consult: L Foot infection Referring Physician: Dr. Layla Barter is an 37 y.o. male.  HPI: Patient admitted for infection to the left.  Warning daughter.  Last seen in the office on 11/14.  At that time his wound worsened with exposed capsule but no bone exposure.  X-rays were negative at the time for osteomyelitis.  He had small local warmth and was started on clindamycin.  States he did not get the antibiotics that day and started them next day.  States that he had worsening of the wound to the point where he noticed a significant odor on Wednesday.  Patient presented to the office yesterday with was seen by Dr. Amalia Hailey.  He was sent immediately to the emergency room plans for incision and drainage and likely amputation.  This morning patient is very agitated, especially about not being able to eat.  He seems to not understand the severity of his condition.  Past Medical History:  Diagnosis Date  . Diabetes mellitus without complication ([place])   . Diabetic gastroparesis ([place])   . Dialysis patient ([place])   . Hypertension   . Renal disorder    Dialysis  . Sepsis [facility])     Past Surgical History:  Procedure Laterality Date  . AMPUTATION Right 02/02/2018   Procedure: RIGHT FIFTH TOE AND METATARSAL AMPUTATION. Filetted toe flap metatarsal resection. Debridement Plantar Foot wound;  Surgeon: Evelina Bucy, DPM;  Location: [place];  Service: Podiatry;  Laterality: Right;  . APPLICATION OF WOUND VAC  02/02/2018   Procedure: APPLICATION OF WOUND VAC  Right Foot;  Surgeon: Evelina Bucy, DPM;  Location: [place];  Service: Podiatry;;  . AV FISTULA PLACEMENT     left arm.  . AV FISTULA PLACEMENT Right 12/22/2016   Procedure: INSERTION OF ARTERIOVENOUS (AV) GORE-TEX GRAFT ARM;  Surgeon: Elam Dutch, MD;  Location: [facility];  Service: Vascular;  Laterality: Right;  . AV FISTULA PLACEMENT Left 05/26/2018   Procedure: INSERTION OF  ARTERIOVENOUS (AV) GORE-TEX GRAFT LEFT ARM;  Surgeon:  Serafina Mitchell, MD;  Location: [place];  Service: Vascular;  Laterality: Left;  . EYE SURGERY    . I&D EXTREMITY Right 10/31/2017   Procedure: IRRIGATION AND DEBRIDEMENT RIGHT FOOT;  Surgeon: Evelina Bucy, DPM;  Location: [place];  Service: Podiatry;  Laterality: Right;  . INSERTION OF DIALYSIS CATHETER     Right subclavian  . IR AV DIALY SHUNT INTRO NEEDLE/INTRACATH INITIAL W/PTA/IMG RIGHT Right 02/05/2018  . IR US GUIDE VASC ACCESS RIGHT  02/05/2018  . UPPER EXTREMITY VENOGRAPHY N/A 11/16/2016   Procedure: Upper Extremity Venography - Right Central;  Surgeon: Elam Dutch, MD;  Location: [place] [facility];  Service: Cardiovascular;  Laterality: N/A;  . UPPER EXTREMITY VENOGRAPHY N/A 05/25/2018   Procedure: UPPER EXTREMITY VENOGRAPHY - Bilateral;  Surgeon: Marty Heck, MD;  Location: [place] [facility];  Service: Cardiovascular;  Laterality: N/A;    Family History  Problem Relation Age of Onset  . Diabetes Mellitus II Unknown     Social History:  reports that he has never smoked. He has never used smokeless tobacco. He reports that he does not drink alcohol or use drugs.  Allergies: No Known Allergies  Medications: I have reviewed the patient's current medications.  Results for orders placed or performed during the hospital encounter of 08/17/18 (from the past 48 hour(s))  Glucose, capillary     Status: Abnormal   Collection Time: 08/17/18  7:48  PM  Result Value Ref Range   Glucose-Capillary 272 (H) 70 - 99 mg/dL  CBC     Status: Abnormal   Collection Time: 08/17/18 10:27 PM  Result Value Ref Range   WBC 15.3 (H) 4.0 - 10.5 K/uL   RBC 2.94 (L) 4.22 - 5.81 MIL/uL   Hemoglobin 8.4 (L) 13.0 - 17.0 g/dL   HCT 27.3 (L) 39.0 - 52.0 %   MCV 92.9 80.0 - 100.0 fL   MCH 28.6 26.0 - 34.0 pg   MCHC 30.8 30.0 - 36.0 g/dL   RDW 14.0 11.5 - 15.5 %   Platelets 221 150 - 400 K/uL   nRBC 0.0 0.0 - 0.2 %    Comment: Performed at [facility], [place] [address], [place],  [place] [address]  Basic metabolic panel     Status: Abnormal   Collection Time: 08/17/18 10:27 PM  Result Value Ref Range   Sodium 133 (L) 135 - 145 mmol/L   Potassium 4.2 3.5 - 5.1 mmol/L   Chloride 94 (L) 98 - 111 mmol/L   CO2 26 22 - 32 mmol/L   Glucose, Bld 107 (H) 70 - 99 mg/dL   BUN 44 (H) 6 - 20 mg/dL   Creatinine, Ser 9.86 (H) 0.61 - 1.24 mg/dL   Calcium 8.5 (L) 8.9 - 10.3 mg/dL   GFR calc non Af Amer 6 (L) >60 mL/min   GFR calc Af Amer 7 (L) >60 mL/min    Comment: (NOTE) The eGFR has been calculated using the CKD EPI equation. This calculation has not been validated in all clinical situations. eGFR's persistently <60 mL/min signify possible Chronic Kidney Disease.    Anion gap 13 5 - 15    Comment: Performed at [facility] [address] [place], [place] [address]  MRSA PCR Screening     Status: None   Collection Time: 08/18/18  2:28 AM  Result Value Ref Range   MRSA by PCR NEGATIVE NEGATIVE    Comment:        The GeneXpert MRSA Assay (FDA approved for NASAL specimens only), is one component of a comprehensive MRSA colonization surveillance program. It is not intended to diagnose MRSA infection nor to guide or monitor treatment for MRSA infections. Performed at [facility], [place] [address], [place], [place] [address]   Glucose, capillary     Status: Abnormal   Collection Time: 08/18/18  4:45 AM  Result Value Ref Range   Glucose-Capillary 186 (H) 70 - 99 mg/dL    No results found.  ROS Blood pressure 137/67, pulse 87, temperature 98.6 F (37 C), temperature source Oral, resp. rate 18, height _0  (1.676 m), weight 78.2 kg, SpO2 98 %. Physical Exam L Foot warm and well perfused. Positive motor distally. Sensation absent distally. Wound with gross necrosis with exposed bone. Gross purulence expressible from the proximal aspect of the wound. Surrounding warmth and erythema.  Assessment/Plan:  Diabetic Foot Infection L Foot -Labs reviewed - elevated  WBC. Check CRP and ESR to later trend. -MRI pending. -Continue empiric Abx. Will take cultures intra-op -Discussed plan for surgery today - plan for Incision and Drainage, possible fifth toe amputation, possible partial fifth metatarsal resection, bone biopsy, possible wound VAC. -Lengthy discussion, patient will his current situation and plan of care.  Patient seems to be not understanding severity of the totality his medical issues and how things can progress quickly and seriously. He complains that he thinks the doctors  are only out for financial reasons and accused me of only wanting to do the surgery because of financial reasons.  I specific discussed the patient that this is not the case and that my goal is to help treat the severe infection that he has. He does not seem to understand how his medical issues and history of non-compliance are contributory to his current state. At his last hospitalization he was thought to have undiagnosed bipolar or other psych disorder, would consider possible psych referral on outpatient basis as he has not followed up as such.  Total time spent with patient and floor time: 62 mins. >50% of this was spent on counseling and coordination of care.  Evelina Bucy 08/18/2018, 7:35 AM

## 2018-08-18 NOTE — Anesthesia Preprocedure Evaluation (Addendum)
Anesthesia Evaluation  Patient identified by MRN, date of birth, ID band Patient awake    Reviewed: Allergy & Precautions, H&P , NPO status , Patient's Chart, lab work & pertinent test results  Airway Mallampati: II  TM Distance: >3 FB Neck ROM: Full    Dental no notable dental hx. (+) Teeth Intact, Dental Advisory Given   Pulmonary neg pulmonary ROS,    Pulmonary exam normal breath sounds clear to auscultation       Cardiovascular hypertension, Pt. on medications Normal cardiovascular exam Rhythm:Regular Rate:Normal     Neuro/Psych PSYCHIATRIC DISORDERS negative neurological ROS     GI/Hepatic negative GI ROS, Neg liver ROS,   Endo/Other  diabetes, Type 1  Renal/GU DialysisRenal disease     Musculoskeletal   Abdominal   Peds  Hematology  (+) Blood dyscrasia, Sickle cell trait and anemia ,   Anesthesia Other Findings   Reproductive/Obstetrics                            Lab Results  Component Value Date   CREATININE 10.45 (H) 08/18/2018   BUN 47 (H) 08/18/2018   NA 131 (L) 08/18/2018   K 4.7 08/18/2018   CL 92 (L) 08/18/2018   CO2 25 08/18/2018    Anesthesia Physical Anesthesia Plan  ASA: IV  Anesthesia Plan: General   Post-op Pain Management:    Induction: Intravenous  PONV Risk Score and Plan: 2 and Treatment may vary due to age or medical condition, Ondansetron and Dexamethasone  Airway Management Planned: LMA  Additional Equipment:   Intra-op Plan:   Post-operative Plan:   Informed Consent: I have reviewed the patients History and Physical, chart, labs and discussed the procedure including the risks, benefits and alternatives for the proposed anesthesia with the patient or authorized representative who has indicated his/her understanding and acceptance.   Dental advisory given  Plan Discussed with:   Anesthesia Plan Comments:         Anesthesia Quick  Evaluation

## 2018-08-18 NOTE — Progress Notes (Signed)
Inpatient Diabetes Program Recommendations  AACE/ADA: New Consensus Statement on Inpatient Glycemic Control (2015)  Target Ranges:  Prepandial:   less than 140 mg/dL      Peak postprandial:   less than 180 mg/dL (1-2 hours)      Critically ill patients:  140 - 180 mg/dL   Lab Results  Component Value Date   GLUCAP 213 (H) 08/18/2018   HGBA1C 7.3 (H) 08/18/2018    Review of Glycemic Control Results for REY, DANSBY (MRN [id number]) as of 08/18/2018 09:55  Ref. Range 08/18/2018 04:45 08/18/2018 07:44  Glucose-Capillary Latest Ref Range: 70 - 99 mg/dL 186 (H) 213 (H)   Diabetes history: DM2 Outpatient Diabetes medications: Lantus 10 units QHS, Novolog 5 units BID Current orders for Inpatient glycemic control: Lantus 10 units QHS, Novolog 0-9 units TID  Inpatient Diabetes Program Recommendations:   Noted that patient is NPO for potential amputation. Once resumes diet, consider ordering Novolog 2 units TID with meals for meal coverage if patient eats at least 50% of meals.  Thanks, Bronson Curb, MSN, RNC-OB Diabetes Coordinator [phone] (8a-5p)

## 2018-08-18 NOTE — Brief Op Note (Signed)
08/18/2018  2:08 PM  PATIENT:  [name]  37 y.o. male  PRE-OPERATIVE DIAGNOSIS:  left foot infection  POST-OPERATIVE DIAGNOSIS:  left foot infection  PROCEDURE:  Procedure(s): IRRIGATION AND DEBRIDEMENT OF LEFT 5TH TOE AND TRANSMETATARSAL, WITH PARTICAL LEFT 5TH TOE AND METATARSAL AMPUTATION, BONE BIOPSY, WOUND VAC APPLICATION. (N/A)  SURGEON:  Surgeon(s) and Role:    * Evelina Bucy, DPM - Primary  PHYSICIAN ASSISTANT:   ASSISTANTS: none   ANESTHESIA:   local and MAC  EBL: 25 ccs  BLOOD ADMINISTERED:none  DRAINS: none   LOCAL MEDICATIONS USED:  MARCAINE    and Amount: 10 ml  SPECIMEN:   ID Type Source Tests Collected by Time Destination  1 : left fifth toe and metatarsal Amputation Toe, Left SURGICAL PATHOLOGY Evelina Bucy, DPM 08/18/2018 1336   2 : bone metatartsal Tissue Bone SURGICAL PATHOLOGY Evelina Bucy, DPM 08/18/2018 1340   3 : left proximal phalanx Tissue Bone SURGICAL PATHOLOGY Evelina Bucy, DPM 08/18/2018 1342   4 : biopsy proximal metatarsal Tissue Bone SURGICAL PATHOLOGY Evelina Bucy, DPM 08/18/2018 1358   A : bone, left fifth amputation Tissue Bone FUNGUS CULTURE WITH STAIN, AEROBIC/ANAEROBIC CULTURE (SURGICAL/DEEP WOUND) Evelina Bucy, DPM 08/18/2018 1400   B : soft tissue left fifth amputation Tissue Soft Tissue, Other FUNGUS CULTURE WITH STAIN, AEROBIC/ANAEROBIC CULTURE (SURGICAL/DEEP WOUND) Evelina Bucy, DPM 08/18/2018 1401     DISPOSITION OF SPECIMEN:  path and micro  COUNTS:  YES  TOURNIQUET:  * No tourniquets in log *  DICTATION: .Note written in EPIC  PLAN OF CARE: transfer to floor  PATIENT DISPOSITION:  PACU - hemodynamically stable.   Delay start of Pharmacological VTE agent (>24hrs) due to surgical blood loss or risk of bleeding: not applicable

## 2018-08-18 NOTE — Progress Notes (Signed)
Patient seen in PACU prior to surgery. Asked if he had any questions regarding surgical plan, patient states no.   Patient does state that should he not receive a regular diet right when he wakes up from surgery he will put weight on his foot and walk down to the cafeteria with his own money to purchase food.  Discussed that not following the prescribed carb controlled renal diet will impede healing and is not recommended. Patient verbalized understanding and insists on regular diet. He has previously been placed on regular diet.  I remain concerned that patient is setting himself up for a poor outcome due to his non-compliance with recommended treatment.  Evelina Bucy, DPM

## 2018-08-18 NOTE — Progress Notes (Signed)
Patient's wound VAC constantly beeping and screen says blockage to instillation tubing. All clamps on the tubing are open,patient is draining serosanguinous drainage in to the wound VAC container. MD,Price made aware. Order received to change setting to just wound Vac 125 continuous instead of vac and instillation. Will continue to monitor. Verlin Duke, Wonda Cheng, Therapist, sports

## 2018-08-18 NOTE — Progress Notes (Signed)
[facility]                                                                              Patient Demographics  [name], is a 37 y.o. male, DOB - Dec 12, 1980, CVE:[id number]  Admit date - 08/17/2018   Admitting Physician Janora Norlander, MD  Outpatient Primary MD for the patient is [name], [name], MD  Outpatient specialists:   LOS - 1  days   Medical records reviewed and are as summarized below:    No chief complaint on file.      Brief summary   Patient is a 37 year old male with hypertension, ESRD on hemodialysis MWF, uncontrolled diabetes type 1, chronic diabetic left foot ulcer, was sent from podiatry office to the hospital on 11/20 for worsening of the left foot ulcer.  Patient had noted strong malodor with heavy drainage from the ulcer.  He had been using Santyl on the wound and last clinic visit or was on 11/15 and had been on clindamycin twice daily.  Assessment & Plan    Principal Problem: Left diabetic foot infection ([place]) with gangrene of the left foot -WBC count 15.3 at the time of admission, seen by podiatry today, discussed with Dr. March Rummage -Plan for OR today, follow MRI of the left foot -Continue pain control -Continue IV antibiotics vancomycin and Zosyn, will follow Intra-Op cultures, recommended 2 weeks of IV antibiotics with hemodialysis after the surgery  Active Problems:   Diabetes mellitus type 1 ([place]), noncompliant, uncontrolled, insulin-dependent, with hyperglycemia -Patient refused carb modified diet, CBGs 272 at the time of admission -Hemoglobin A1c 7.3 -CBGs uncontrolled, in 200s, continue Lantus 10 units at bedtime, placed on NovoLog meal coverage 3 units 3 times daily AC, continue sliding scale insulin    Diabetic gastroparesis ([name]) -Continue Zofran as needed, Reglan    HTN (hypertension) -BP currently controlled, continue Norvasc    ESRD (end stage renal disease) ([place]) on hemodialysis -Nephrology consulted, on HD  MWF,    Anemia of chronic disease -H&H currently stable 9.3  History of bipolar disorder, noncompliance -At the time of examination, became irritable and hostile towards MD and staff for asking how he was doing.  -Continue Klonopin  Code Status: Full CODE STATUS DVT Prophylaxis: Pending surgery today, will place on heparin subcu after surgery Family Communication: Discussed in detail with the patient, all imaging results, lab results explained to the patient  Disposition Plan: OR today  Time Spent in minutes   35  Procedures:  None  Consultants:   Podiatry  Antimicrobials:      Medications  Scheduled Meds: . amLODipine  10 mg Oral Daily  . cinacalcet  30 mg Oral Q supper  . collagenase  1 application Topical Daily  . insulin aspart  0-9 Units Subcutaneous TID WC  . insulin glargine  10 Units Subcutaneous QHS  . metoCLOPramide  5 mg Oral TID AC  . sevelamer carbonate  3,200 mg Oral BID WC   Continuous Infusions: . piperacillin-tazobactam (ZOSYN)  IV 3.375 g (08/18/18 1040)  . [START ON 08/19/2018] vancomycin     PRN Meds:.acetaminophen, clonazePAM, morphine injection, ondansetron   Antibiotics  Anti-infectives (From admission, onward)   Start     Dose/Rate Route Frequency Ordered Stop   08/19/18 1200  vancomycin (VANCOCIN) IVPB 750 mg/150 ml premix     750 mg 150 mL/hr over 60 Minutes Intravenous Every M-W-F (Hemodialysis) 08/17/18 2321     08/17/18 2330  vancomycin (VANCOCIN) 1,500 mg in sodium chloride 0.9 % 500 mL IVPB     1,500 mg 250 mL/hr over 120 Minutes Intravenous  Once 08/17/18 2319 08/18/18 0339   08/17/18 2245  piperacillin-tazobactam (ZOSYN) IVPB 3.375 g     3.375 g 12.5 mL/hr over 240 Minutes Intravenous Every 12 hours 08/17/18 2235          Subjective:   [name] was seen and examined today.  Became irritable and hostile for greeting him with hello and how he was doing.   Patient denies dizziness, chest pain, shortness of breath,  abdominal pain, low-grade temp of 99.9 F  Objective:   Vitals:   08/17/18 1950 08/18/18 0444 08/18/18 0600  BP: (!) 145/88 137/67   Pulse: 100 87   Resp: 18 18   Temp: 99.9 F (37.7 C) 98.6 F (37 C)   TempSrc: Oral Oral   SpO2:  98%   Weight: 78.2 kg    Height:   5\' 6"  (1.676 m)    Intake/Output Summary (Last 24 hours) at 08/18/2018 1145 Last data filed at 08/18/2018 0900 Gross per 24 hour  Intake 1026.65 ml  Output 0 ml  Net 1026.65 ml     Wt Readings from Last 3 Encounters:  08/17/18 78.2 kg  05/26/18 83.9 kg  05/25/18 83.9 kg     Exam  General: Alert and oriented x 3, NAD, irritable  Eyes:   HEENT:    Cardiovascular: S1 S2 auscultated, Regular rate and rhythm.  Respiratory: Clear to auscultation bilaterally, no wheezing, rales or rhonchi  Gastrointestinal: Soft, nontender, nondistended, + bowel sounds  Ext: no pedal edema bilaterally  Neuro: no new deficits  Musculoskeletal: No digital cyanosis, clubbing  Skin: Left lower extremity dressing intact  Psych: Normal affect and demeanor, alert and oriented x3    Data Reviewed:  I have personally reviewed following labs and imaging studies  Micro Results Recent Results (from the past 240 hour(s))  MRSA PCR Screening     Status: None   Collection Time: 08/18/18  2:28 AM  Result Value Ref Range Status   MRSA by PCR NEGATIVE NEGATIVE Final    Comment:        The GeneXpert MRSA Assay (FDA approved for NASAL specimens only), is one component of a comprehensive MRSA colonization surveillance program. It is not intended to diagnose MRSA infection nor to guide or monitor treatment for MRSA infections. Performed at [facility], [place] [address] [place], [place] [address]     Radiology Reports Mr Foot Left Wo Contrast  Result Date: 08/18/2018 CLINICAL DATA:  Diabetic foot ulcer along the lateral forefoot. EXAM: MRI OF THE LEFT FOOT WITHOUT CONTRAST TECHNIQUE: Multiplanar, multisequence MR  imaging of the left was performed. No intravenous contrast was administered. COMPARISON:  None. FINDINGS: Bones/Joint/Cartilage Soft tissue wound along the plantar lateral aspect of the fifth MTP joint extending to the cortex. Cortical destruction with severe bone marrow edema in the base of the fifth proximal phalanx and fifth metatarsal head most consistent with osteomyelitis. Mild marrow edema in the fifth middle phalanx which may be reactive versus secondary to osteomyelitis. No other marrow signal abnormality. No fracture or dislocation.  Normal alignment. No joint effusion. Ligaments Lisfranc ligament is intact.  Collateral ligaments are intact. Muscles and Tendons Flexor, peroneal and extensor compartment tendons are intact. Mild muscle atrophy. Soft tissue No fluid collection or hematoma. No soft tissue mass. Soft tissue edema in the lateral forefoot extending into the fifth digit most consistent with cellulitis. Small focus of low signal in the soft tissues along the medial aspect of the fifth digit likely reflecting a small amount of air. IMPRESSION: 1. Soft tissue wound along the plantar lateral aspect of the fifth MTP joint with surrounding cellulitis. Osteomyelitis of the fifth metatarsal head and base of the fifth proximal phalanx with severe surrounding marrow edema. Electronically Signed   By: Kathreen Devoid   On: 08/18/2018 10:08   Dg Foot Complete Left  Result Date: 08/11/2018 Please see detailed radiograph report in office note.  Dg Foot Complete Left  Result Date: 08/04/2018 Please see detailed radiograph report in office note.   Lab Data:  CBC: Recent Labs  Lab 08/17/18 2227 08/18/18 0719  WBC 15.3* 14.0*  HGB 8.4* 9.3*  HCT 27.3* 29.8*  MCV 92.9 92.5  PLT 221 161   Basic Metabolic Panel: Recent Labs  Lab 08/17/18 2227 08/18/18 0719  NA 133* 131*  K 4.2 4.7  CL 94* 92*  CO2 26 25  GLUCOSE 107* 188*  BUN 44* 47*  CREATININE 9.86* 10.45*  CALCIUM 8.5* 8.8*  PHOS   --  4.9*   GFR: Estimated Creatinine Clearance: 9.4 mL/min (A) (by C-G formula based on SCr of 10.45 mg/dL (H)). Liver Function Tests: Recent Labs  Lab 08/18/18 0719  ALBUMIN 2.6*   No results for input(s): LIPASE, AMYLASE in the last 168 hours. No results for input(s): AMMONIA in the last 168 hours. Coagulation Profile: No results for input(s): INR, PROTIME in the last 168 hours. Cardiac Enzymes: No results for input(s): CKTOTAL, CKMB, CKMBINDEX, TROPONINI in the last 168 hours. BNP (last 3 results) No results for input(s): PROBNP in the last 8760 hours. HbA1C: Recent Labs    08/18/18 0719  HGBA1C 7.3*   CBG: Recent Labs  Lab 08/17/18 1948 08/18/18 0445 08/18/18 0744 08/18/18 1127  GLUCAP 272* 186* 213* 234*   Lipid Profile: No results for input(s): CHOL, HDL, LDLCALC, TRIG, CHOLHDL, LDLDIRECT in the last 72 hours. Thyroid Function Tests: No results for input(s): TSH, T4TOTAL, FREET4, T3FREE, THYROIDAB in the last 72 hours. Anemia Panel: No results for input(s): VITAMINB12, FOLATE, FERRITIN, TIBC, IRON, RETICCTPCT in the last 72 hours. Urine analysis:    Component Value Date/Time   COLORURINE YELLOW 05/26/2015 1115   APPEARANCEUR CLEAR 05/26/2015 1115   APPEARANCEUR Hazy 07/30/2014 2303   LABSPEC 1.016 05/26/2015 1115   LABSPEC 1.012 07/30/2014 2303   PHURINE 8.0 05/26/2015 1115   GLUCOSEU 500 (A) 05/26/2015 1115   GLUCOSEU >=500 07/30/2014 2303   HGBUR MODERATE (A) 05/26/2015 1115   BILIRUBINUR NEGATIVE 05/26/2015 1115   BILIRUBINUR Negative 07/30/2014 2303   KETONESUR 15 (A) 05/26/2015 1115   PROTEINUR >300 (A) 05/26/2015 1115   UROBILINOGEN 0.2 05/26/2015 1115   NITRITE NEGATIVE 05/26/2015 1115   LEUKOCYTESUR NEGATIVE 05/26/2015 1115   LEUKOCYTESUR Negative 07/30/2014 2303     Creedence Kunesh M.D. [facility] 08/18/2018, 11:45 AM  Pager: [phone] Between 7am to 7pm - call Pager - [phone]  After 7pm go to [email or web address] - password  [email or web address]  Call night coverage person covering after 7pm

## 2018-08-18 NOTE — Transfer of Care (Signed)
Immediate Anesthesia Transfer of Care Note  Patient: [name]  Procedure(s) Performed: IRRIGATION AND DEBRIDEMENT OF LEFT 5TH TOE AND TRANSMETATARSAL, WITH PARTICAL LEFT 5TH TOE AND METATARSAL AMPUTATION, BONE BIOPSY, WOUND VAC APPLICATION. (N/A )  Patient Location: PACU  Anesthesia Type:General  Level of Consciousness: awake and alert   Airway & Oxygen Therapy: Patient Spontanous Breathing  Post-op Assessment: Report given to RN  Post vital signs: Reviewed  Last Vitals:  Vitals Value Taken Time  BP    Temp    Pulse    Resp    SpO2      Last Pain:  Vitals:   08/18/18 1438  TempSrc:   PainSc: Asleep         Complications: No apparent anesthesia complications

## 2018-08-18 NOTE — Consult Note (Addendum)
[facility] Renal Consultation Note  Indication for Consultation:  Management of ESRD/hemodialysis; anemia, hypertension/volume and secondary hyperparathyroidism  HPI: [name] is a 37 y.o. 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 11/201/9 Seen by Dr. Amalia Hailey with  strong malodor with heavy drainage advised the patient to come into the hospital as a direct admit.  Patient will need surgical I&D and likely partial foot amputation.  He recommended starting IV antibiotics upon admission.    Today pt reports some foot discomfort. He Denies fevers, chills, chest pain , sob, cough , abd pain, N/V/D Initially somewhat hostile,Irritated  because  I was asking  how he was doing .  Noted last HD yest.at [facility] missed 27 min  Tx  time  ,Admit  labs  Wbc 15.3 / hgb 8.4  K 4.2 glu 107   /MRI Foot  =Osteomyelitis of the fifth metatarsal head and base of the fifth proximal phalanx  We are consulted for ESRD /issues        Past Medical History:  Diagnosis Date  . Diabetes mellitus without complication ([place])   . Diabetic gastroparesis ([place])   . Dialysis patient ([place])   . Hypertension   . Renal disorder    Dialysis  . Sepsis [facility])     Past Surgical History:  Procedure Laterality Date  . AMPUTATION Right 02/02/2018   Procedure: RIGHT FIFTH TOE AND METATARSAL AMPUTATION. Filetted toe flap metatarsal resection. Debridement Plantar Foot wound;  Surgeon: Evelina Bucy, DPM;  Location: [place];  Service: Podiatry;  Laterality: Right;  . APPLICATION OF WOUND VAC  02/02/2018   Procedure: APPLICATION OF WOUND VAC  Right Foot;  Surgeon: Evelina Bucy, DPM;  Location: [place];  Service: Podiatry;;  . AV FISTULA PLACEMENT     left arm.  . AV FISTULA  PLACEMENT Right 12/22/2016   Procedure: INSERTION OF ARTERIOVENOUS (AV) GORE-TEX GRAFT ARM;  Surgeon: Elam Dutch, MD;  Location: [facility] OR;  Service: Vascular;  Laterality: Right;  . AV FISTULA PLACEMENT Left 05/26/2018   Procedure: INSERTION OF  ARTERIOVENOUS (AV) GORE-TEX GRAFT LEFT ARM;  Surgeon: Serafina Mitchell, MD;  Location: [place];  Service: Vascular;  Laterality: Left;  . EYE SURGERY    . I&D EXTREMITY Right 10/31/2017   Procedure: IRRIGATION AND DEBRIDEMENT RIGHT FOOT;  Surgeon: Evelina Bucy, DPM;  Location: [place];  Service: Podiatry;  Laterality: Right;  . INSERTION OF DIALYSIS CATHETER     Right subclavian  . IR AV DIALY SHUNT INTRO NEEDLE/INTRACATH INITIAL W/PTA/IMG RIGHT Right 02/05/2018  . IR US GUIDE VASC ACCESS RIGHT  02/05/2018  . UPPER EXTREMITY VENOGRAPHY N/A 11/16/2016   Procedure: Upper Extremity Venography - Right Central;  Surgeon: Elam Dutch, MD;  Location: [place] [facility];  Service: Cardiovascular;  Laterality: N/A;  . UPPER EXTREMITY VENOGRAPHY N/A 05/25/2018   Procedure: UPPER EXTREMITY VENOGRAPHY - Bilateral;  Surgeon: Marty Heck, MD;  Location: [place] [facility];  Service: Cardiovascular;  Laterality: N/A;      Family History  Problem Relation Age of Onset  . Diabetes Mellitus II Unknown       reports that he has never smoked. He has never used smokeless tobacco. He reports  that he does not drink alcohol or use drugs.  No Known Allergies  Prior to Admission medications   Medication Sig Start Date End Date Taking? Authorizing Provider  acetaminophen (TYLENOL) 325 MG tablet Take 2 tablets (650 mg total) by mouth every 6 (six) hours as needed for mild pain or headache (pain/headache). Can restart this after you are done with Oxycodone/Tylenol 11/10/17   Domenic Polite, MD  amLODipine (NORVASC) 10 MG tablet Take 10 mg by mouth daily.    [provider]  cinacalcet (SENSIPAR) 30 MG tablet Take 30 mg by mouth daily with supper.     [provider]  clindamycin (CLEOCIN) 300 MG capsule Take 1 capsule (300 mg total) by mouth 2 (two) times daily. 08/11/18   Evelina Bucy, DPM  clonazePAM (KLONOPIN) 0.5 MG tablet Take 0.5 mg by mouth daily as needed for anxiety (sleep).  07/21/17   [provider]  cloNIDine (CATAPRES - DOSED IN MG/24 HR) 0.3 mg/24hr patch APPLY ONE PATCH TOPICALLY ONCE A WEEK 06/01/16   [provider]  collagenase (SANTYL) ointment Apply 1 application topically daily. 07/07/18   Evelina Bucy, DPM  glucose blood ([employer] TEST STRIPS) test strip by Other route 4 (four) times a day as needed. 10/29/16   [provider]  insulin aspart (NOVOLOG) 100 UNIT/ML injection Inject 5 Units into the skin 2 (two) times daily with a meal.     [provider]  insulin glargine (LANTUS) 100 UNIT/ML injection Inject 0.1 mLs (10 Units total) into the skin at bedtime. 02/05/18   Velna Ochs, MD  losartan (COZAAR) 50 MG tablet Take 50 mg by mouth daily. 09/27/17   [provider]  metoCLOPramide (REGLAN) 5 MG tablet TAKE ONE TABLET BY MOUTH THREE TIMES DAILY BEFORE MEAL(S) 12/08/15   [provider]  ondansetron (ZOFRAN ODT) 4 MG disintegrating tablet Take 1 tablet (4 mg total) by mouth every 8 (eight) hours as needed for nausea or vomiting. 11/23/17   Ward, Ozella Almond, PA-C  oxyCODONE-acetaminophen (PERCOCET/ROXICET) 5-325 MG tablet Take 1 tablet by mouth every 6 (six) hours as needed. 05/26/18   Ulyses Amor, PA-C  polyethylene glycol-electrolytes (NULYTELY/GOLYTELY) 420 g solution  08/15/18   [provider]  sevelamer carbonate (RENVELA) 800 MG tablet Take 2,400-3,200 mg by mouth See admin instructions. Take 3 - 4 tablets (2400 mg -3200 mg) by mouth two times daily with meals    [provider]     Anti-infectives (From admission, onward)   Start     Dose/Rate Route Frequency Ordered Stop   08/19/18 1200  vancomycin (VANCOCIN) IVPB 750  mg/150 ml premix     750 mg 150 mL/hr over 60 Minutes Intravenous Every M-W-F (Hemodialysis) 08/17/18 2321     08/17/18 2330  vancomycin (VANCOCIN) 1,500 mg in sodium chloride 0.9 % 500 mL IVPB     1,500 mg 250 mL/hr over 120 Minutes Intravenous  Once 08/17/18 2319 08/18/18 0339   08/17/18 2245  piperacillin-tazobactam (ZOSYN) IVPB 3.375 g     3.375 g 12.5 mL/hr over 240 Minutes Intravenous Every 12 hours 08/17/18 2235        Results for orders placed or performed during the hospital encounter of 08/17/18 (from the past 48 hour(s))  Glucose, capillary     Status: Abnormal   Collection Time: 08/17/18  7:48 PM  Result Value Ref Range   Glucose-Capillary 272 (H) 70 - 99 mg/dL  CBC     Status: Abnormal  Collection Time: 08/17/18 10:27 PM  Result Value Ref Range   WBC 15.3 (H) 4.0 - 10.5 K/uL   RBC 2.94 (L) 4.22 - 5.81 MIL/uL   Hemoglobin 8.4 (L) 13.0 - 17.0 g/dL   HCT 27.3 (L) 39.0 - 52.0 %   MCV 92.9 80.0 - 100.0 fL   MCH 28.6 26.0 - 34.0 pg   MCHC 30.8 30.0 - 36.0 g/dL   RDW 14.0 11.5 - 15.5 %   Platelets 221 150 - 400 K/uL   nRBC 0.0 0.0 - 0.2 %    Comment: Performed at [facility], [place] [address], [place], [place] [address]  Basic metabolic panel     Status: Abnormal   Collection Time: 08/17/18 10:27 PM  Result Value Ref Range   Sodium 133 (L) 135 - 145 mmol/L   Potassium 4.2 3.5 - 5.1 mmol/L   Chloride 94 (L) 98 - 111 mmol/L   CO2 26 22 - 32 mmol/L   Glucose, Bld 107 (H) 70 - 99 mg/dL   BUN 44 (H) 6 - 20 mg/dL   Creatinine, Ser 9.86 (H) 0.61 - 1.24 mg/dL   Calcium 8.5 (L) 8.9 - 10.3 mg/dL   GFR calc non Af Amer 6 (L) >60 mL/min   GFR calc Af Amer 7 (L) >60 mL/min    Comment: (NOTE) The eGFR has been calculated using the CKD EPI equation. This calculation has not been validated in all clinical situations. eGFR's persistently <60 mL/min signify possible Chronic Kidney Disease.    Anion gap 13 5 - 15    Comment: Performed at [facility]  [address], [place], [place] [address]  MRSA PCR Screening     Status: None   Collection Time: 08/18/18  2:28 AM  Result Value Ref Range   MRSA by PCR NEGATIVE NEGATIVE    Comment:        The GeneXpert MRSA Assay (FDA approved for NASAL specimens only), is one component of a comprehensive MRSA colonization surveillance program. It is not intended to diagnose MRSA infection nor to guide or monitor treatment for MRSA infections. Performed at [facility], [place] [address], [place], [place] [address]   Glucose, capillary     Status: Abnormal   Collection Time: 08/18/18  4:45 AM  Result Value Ref Range   Glucose-Capillary 186 (H) 70 - 99 mg/dL  CBC     Status: Abnormal   Collection Time: 08/18/18  7:19 AM  Result Value Ref Range   WBC 14.0 (H) 4.0 - 10.5 K/uL   RBC 3.22 (L) 4.22 - 5.81 MIL/uL   Hemoglobin 9.3 (L) 13.0 - 17.0 g/dL   HCT 29.8 (L) 39.0 - 52.0 %   MCV 92.5 80.0 - 100.0 fL   MCH 28.9 26.0 - 34.0 pg   MCHC 31.2 30.0 - 36.0 g/dL   RDW 14.1 11.5 - 15.5 %   Platelets 261 150 - 400 K/uL   nRBC 0.0 0.0 - 0.2 %    Comment: Performed at [facility], [place]. [address], [place], [place] [address]  Renal function panel     Status: Abnormal   Collection Time: 08/18/18  7:19 AM  Result Value Ref Range   Sodium 131 (L) 135 - 145 mmol/L   Potassium 4.7 3.5 - 5.1 mmol/L   Chloride 92 (L) 98 - 111 mmol/L   CO2 25 22 - 32 mmol/L   Glucose, Bld 188 (H) 70 - 99 mg/dL  BUN 47 (H) 6 - 20 mg/dL   Creatinine, Ser 10.45 (H) 0.61 - 1.24 mg/dL   Calcium 8.8 (L) 8.9 - 10.3 mg/dL   Phosphorus 4.9 (H) 2.5 - 4.6 mg/dL   Albumin 2.6 (L) 3.5 - 5.0 g/dL   GFR calc non Af Amer 6 (L) >60 mL/min   GFR calc Af Amer 6 (L) >60 mL/min    Comment: (NOTE) The eGFR has been calculated using the CKD EPI equation. This calculation has not been validated in all clinical situations. eGFR's persistently <60 mL/min signify possible Chronic Kidney Disease.    Anion gap 14 5 - 15    Comment: Performed  at [facility] [address], [place], [place] [address]  Hemoglobin A1c     Status: Abnormal   Collection Time: 08/18/18  7:19 AM  Result Value Ref Range   Hgb A1c MFr Bld 7.3 (H) 4.8 - 5.6 %    Comment: (NOTE) Pre diabetes:          5.7%-6.4% Diabetes:              >6.4% Glycemic control for   <7.0% adults with diabetes    Mean Plasma Glucose 162.81 mg/dL    Comment: Performed at [facility] [address], [place], [place] [address]  C-reactive protein     Status: Abnormal   Collection Time: 08/18/18  7:19 AM  Result Value Ref Range   CRP 15.8 (H) <1.0 mg/dL    Comment: Performed at [facility] [address] [place], [place] [address]  Sedimentation rate     Status: Abnormal   Collection Time: 08/18/18  7:19 AM  Result Value Ref Range   Sed Rate 78 (H) 0 - 16 mm/hr    Comment: Performed at [facility] [address], [place], [place] [address]  Glucose, capillary     Status: Abnormal   Collection Time: 08/18/18  7:44 AM  Result Value Ref Range   Glucose-Capillary 213 (H) 70 - 99 mg/dL    ROS: see hpi  Physical Exam: Vitals:   08/17/18 1950 08/18/18 0444  BP: (!) 145/88 137/67  Pulse: 100 87  Resp: 18 18  Temp: 99.9 F (37.7 C) 98.6 F (37 C)  SpO2:  98%     General: alert AAM , WD, WN, somewhat agitated/frustrated  About npo but not in distress  (normal agitation personality for him) HEENT: [place], eomi, mmm,  facial tattoos  Neck: no jvd Heart: RRR no r,mg Lungs: CTA  Abdomen: bs pos sot, Nt,ND Extremities: L foot dressing not removed  Trace Left pedal edema R sp 5th toe amp no pedal edem aR Skin: No overt erash  Multiple tattoo  Neuro: alert , OX3, moves all extrem. Agitation/frustration  but not in distress , moves all extrem    Dialysis Access: LUA AVGG pos bruit   Dialysis Orders: Center: [facility]  on MWF . EDW 75.5 HD Bath 2k, 2ca  Time 4hr  Heparin 5000.  Access LUA AVGG    Calcitriol 2.25 mcg Ipo/HD No ESA  Last HGB 11.7  06/29/18   (last mircera 50 on 08/03/18)   Assessment/Plan  1. ESRD -  HD  MWF schedule , k and vol ok  Today ,nest HD tomor  2. 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 Hypertension/volume  -bp 136/67  Volume ok / OP meds = Clonidine patch 0.3, Amlodipine 10 mg , Losartan 50 mg q day / Currently on Amlodipine 10 mg q day  only   4. Anemia  - hgb 9.3  No op esa , start Aranesp  40   08/18/18 hd  5. Metabolic bone disease -  Po  Vit d on hd , Renvela bnder when pos  6. DM type 1- per admit   [name], PA-C [place] [phone] 08/18/2018, 10:29 AM

## 2018-08-18 NOTE — Anesthesia Procedure Notes (Signed)
Procedure Name: LMA Insertion °Performed by: Paislee Szatkowski H, CRNA °Pre-anesthesia Checklist: Patient identified, Emergency Drugs available, Suction available and Patient being monitored °Patient Re-evaluated:Patient Re-evaluated prior to induction °Oxygen Delivery Method: Circle System Utilized °Preoxygenation: Pre-oxygenation with 100% oxygen °Induction Type: IV induction °Ventilation: Mask ventilation without difficulty °LMA: LMA inserted °LMA Size: 4.0 °Number of attempts: 1 °Airway Equipment and Method: Bite block °Placement Confirmation: positive ETCO2 °Tube secured with: Tape °Dental Injury: Teeth and Oropharynx as per pre-operative assessment  ° ° ° ° ° ° °

## 2018-08-19 ENCOUNTER — Inpatient Hospital Stay (HOSPITAL_COMMUNITY): Payer: Medicare Other

## 2018-08-19 ENCOUNTER — Encounter (HOSPITAL_COMMUNITY): Payer: Self-pay | Admitting: Podiatry

## 2018-08-19 DIAGNOSIS — E08621 Diabetes mellitus due to underlying condition with foot ulcer: Secondary | ICD-10-CM

## 2018-08-19 DIAGNOSIS — M86172 Other acute osteomyelitis, left ankle and foot: Secondary | ICD-10-CM

## 2018-08-19 DIAGNOSIS — L97424 Non-pressure chronic ulcer of left heel and midfoot with necrosis of bone: Secondary | ICD-10-CM

## 2018-08-19 DIAGNOSIS — M869 Osteomyelitis, unspecified: Secondary | ICD-10-CM

## 2018-08-19 LAB — BASIC METABOLIC PANEL
Anion gap: 13 (ref 5–15)
BUN: 58 mg/dL — ABNORMAL HIGH (ref 6–20)
CO2: 25 mmol/L (ref 22–32)
Calcium: 8.6 mg/dL — ABNORMAL LOW (ref 8.9–10.3)
Chloride: 91 mmol/L — ABNORMAL LOW (ref 98–111)
Creatinine, Ser: 12.34 mg/dL — ABNORMAL HIGH (ref 0.61–1.24)
GFR calc Af Amer: 5 mL/min — ABNORMAL LOW (ref >60)
GFR calc non Af Amer: 4 mL/min — ABNORMAL LOW (ref >60)
Glucose, Bld: 289 mg/dL — ABNORMAL HIGH (ref 70–99)
Potassium: 5.4 mmol/L — ABNORMAL HIGH (ref 3.5–5.1)
Sodium: 129 mmol/L — ABNORMAL LOW (ref 135–145)

## 2018-08-19 LAB — CBC
HCT: 26.4 % — ABNORMAL LOW (ref 39.0–52.0)
Hemoglobin: 8.3 g/dL — ABNORMAL LOW (ref 13.0–17.0)
MCH: 29.2 pg (ref 26.0–34.0)
MCHC: 31.4 g/dL (ref 30.0–36.0)
MCV: 93 fL (ref 80.0–100.0)
Platelets: 237 10*3/uL (ref 150–400)
RBC: 2.84 MIL/uL — ABNORMAL LOW (ref 4.22–5.81)
RDW: 14.1 % (ref 11.5–15.5)
WBC: 17.9 10*3/uL — ABNORMAL HIGH (ref 4.0–10.5)
nRBC: 0 % (ref 0.0–0.2)

## 2018-08-19 LAB — GLUCOSE, CAPILLARY
Glucose-Capillary: 197 mg/dL — ABNORMAL HIGH (ref 70–99)
Glucose-Capillary: 205 mg/dL — ABNORMAL HIGH (ref 70–99)
Glucose-Capillary: 287 mg/dL — ABNORMAL HIGH (ref 70–99)
Glucose-Capillary: 304 mg/dL — ABNORMAL HIGH (ref 70–99)
Glucose-Capillary: 344 mg/dL — ABNORMAL HIGH (ref 70–99)
Glucose-Capillary: 377 mg/dL — ABNORMAL HIGH (ref 70–99)
Glucose-Capillary: 407 mg/dL — ABNORMAL HIGH (ref 70–99)

## 2018-08-19 MED ORDER — VANCOMYCIN HCL IN DEXTROSE 750-5 MG/150ML-% IV SOLN
INTRAVENOUS | Status: AC
  Administered 2018-08-19: 750 mg via INTRAVENOUS
  Filled 2018-08-19: qty 150

## 2018-08-19 MED ORDER — CALCITRIOL 0.25 MCG PO CAPS
ORAL_CAPSULE | ORAL | Status: AC
  Filled 2018-08-19: qty 9

## 2018-08-19 MED ORDER — DARBEPOETIN ALFA 40 MCG/0.4ML IJ SOSY
PREFILLED_SYRINGE | INTRAMUSCULAR | Status: AC
  Administered 2018-08-19: 40 ug via INTRAVENOUS
  Filled 2018-08-19: qty 0.4

## 2018-08-19 MED ORDER — INSULIN ASPART 100 UNIT/ML ~~LOC~~ SOLN: 7.0000 [IU] | Freq: Once | SUBCUTANEOUS | Status: DC

## 2018-08-19 MED ORDER — INSULIN ASPART 100 UNIT/ML ~~LOC~~ SOLN
5.0000 [IU] | Freq: Three times a day (TID) | SUBCUTANEOUS | Status: DC
  Administered 2018-08-19 – 2018-08-25 (×12): 5 [IU] via SUBCUTANEOUS

## 2018-08-19 NOTE — Progress Notes (Addendum)
Inpatient Diabetes Program Recommendations  AACE/ADA: New Consensus Statement on Inpatient Glycemic Control (2015)  Target Ranges:  Prepandial:   less than 140 mg/dL      Peak postprandial:   less than 180 mg/dL (1-2 hours)      Critically ill patients:  140 - 180 mg/dL   Lab Results  Component Value Date   GLUCAP 205 (H) 08/19/2018   HGBA1C 7.3 (H) 08/18/2018    Review of Glycemic Control Results for [name], [name] (MRN [id number]) as of 08/19/2018 14:40  Ref. Range 08/19/2018 00:35 08/19/2018 02:11 08/19/2018 05:22 08/19/2018 09:18 08/19/2018 12:39  Glucose-Capillary Latest Ref Range: 70 - 99 mg/dL 407 (H) 344 (H) 304 (H) 197 (H) 205 (H)   Diabetes history: Type 2 DM  Outpatient Diabetes medications: Lantus 10 units q HS, Novolog 5 units bid Current orders for Inpatient glycemic control:  Novolog sensitive tid with meals and HS, Lantus 10 units q HS Novolog 5 units tid with meals  Inpatient Diabetes Program Recommendations:    Referral received. Spoke to RN and she states that patient has insulin at the bedside and is taking it.  She states that the patient will not tell her how much he is taking.  Patient did receive Novolog 3 units at lunch from RN.  I will encourage patient to improve communication with RN to avoid hypoglycemia.  Thanks,  Adah Perl, RN, BC-ADM Inpatient Diabetes Coordinator Pager [phone] (8a-5p)  Addendum 1455:  Spoke to patient at bedside.  He was eating.  States that he will not tell RN how much insulin he is taking b/c they "don't care".  I explained that for safety purposes, he needs to tell the RN if he takes his own insulin so that he does not get 2 doses which could cause hypoglycemia.  Patient states "Oh yes, I will not let them give me more if I've taken my own".  Seems to understand the dangers of this.  MD has spoken to patient in detail, and explained that he "should not" be self-administering insulin, however the patient refused to give it  up.  Administration was notified and patient states he is waiting to talk to the charge nurse.  Discussed with RN.

## 2018-08-19 NOTE — Progress Notes (Signed)
Inpatient Diabetes Program Recommendations  AACE/ADA: New Consensus Statement on Inpatient Glycemic Control (2019)  Target Ranges:  Prepandial:   less than 140 mg/dL      Peak postprandial:   less than 180 mg/dL (1-2 hours)      Critically ill patients:  140 - 180 mg/dL   Results for KERMIT, ARNETTE (MRN [id number]) as of 08/19/2018 11:52  Ref. Range 08/18/2018 11:27 08/18/2018 14:25 08/18/2018 15:59 08/18/2018 17:06 08/18/2018 20:49 08/18/2018 23:30 08/19/2018 00:35 08/19/2018 02:11 08/19/2018 05:22 08/19/2018 09:18  Glucose-Capillary Latest Ref Range: 70 - 99 mg/dL 234 (H) 160 (H) 146 (H) 156 (H) 362 (H) 476 (H) 407 (H) 344 (H) 304 (H) 197 (H)   Review of Glycemic Control  Diabetes history: DM2 Outpatient Diabetes medications: Lantus 10 units QHS, Novolog 5 units BID Current orders for Inpatient glycemic control: Lantus 10 units QHS, Novolog 0-9 units TID with meals, Novolog 0-5 units QHS, Novolog 3 units TID with meals  NOTE: Noted patient received one time Decadron 4 mg at 13:15 on 08/18/18 which is contributing to hyperglycemia noted. Noted meal coverage insulin was ordered yesterday afternoon. Anticipate glucose to improve since no other steroids ordered and patient is ordered basal, meal coverage, and correction insulin.  Thanks, Barnie Alderman, RN, MSN, CDE Diabetes Coordinator Inpatient Diabetes Program [phone] (Team Pager from 8am to 5pm)

## 2018-08-19 NOTE — Procedures (Signed)
Patient seen and examined on Hemodialysis. QB 400 mL /min via AVG, UF goal 4.L Discussed inacurracy of bed wts with pt and high goal.  Refused for goal to be lowered (as orders written) and said "if I cramp I'm going to holler."  Treatment adjusted as needed.  Madelon Lips MD [facility] pgr [phone] 9:56 AM

## 2018-08-19 NOTE — Progress Notes (Signed)
Patient upset because he wants [place] sandwich despite CBG of 344. He wants to talk to supervisor,informed Charge nurse who went to talk to him. CN informed this RN that patient wants  to talk to Supervisor higher than him. [facility] paged and made aware that patient wants to talk to Supervisor.Valaria Good, Wonda Cheng, RN

## 2018-08-19 NOTE — Progress Notes (Signed)
Patient wants to speak to Supervisor asap.Paged [facility] and made aware that patient wants to speak to a Supervisor. Adamary Savary, Wonda Cheng, Therapist, sports

## 2018-08-19 NOTE — Op Note (Addendum)
Patient Name: [name] DOB: [birth year]  MRN: [id number]   Date of Service: 08/18/18   Surgeon: Dr. Hardie Pulley, DPM Assistants: None Pre-operative Diagnosis: Diabetic foot infection left foot, osteomyelitis left fifth toe, osteomyelitis left fifth metatarsal Post-operative Diagnosis: Same Procedures:  1) Incision and drainage below fascia             2) Bone biopsy left fifth toe  3) Bone biopsy left fifth metatarsal  4) Bone biopsy left fifth metatarsal proximal (clean margin)  5) Amputation left fifth toe  6) Metatarsal resection left fifth metatarsal  7) Application of wound VAC Pathology/Specimens: ID Type Source Tests Collected by Time Destination  1 : left fifth toe and metatarsal Amputation Toe, Left SURGICAL PATHOLOGY Evelina Bucy, DPM 08/18/2018 1336   2 : bone metatartsal Tissue Bone SURGICAL PATHOLOGY Evelina Bucy, DPM 08/18/2018 1340   3 : left proximal phalanx Tissue Bone SURGICAL PATHOLOGY Evelina Bucy, DPM 08/18/2018 1342   4 : biopsy proximal metatarsal Tissue Bone SURGICAL PATHOLOGY Evelina Bucy, DPM 08/18/2018 1358   A : bone, left fifth amputation Tissue Bone FUNGUS CULTURE WITH STAIN, AEROBIC/ANAEROBIC CULTURE (SURGICAL/DEEP WOUND) Evelina Bucy, DPM 08/18/2018 1400   B : soft tissue left fifth amputation Tissue Soft Tissue, Other FUNGUS CULTURE WITH STAIN, AEROBIC/ANAEROBIC CULTURE (SURGICAL/DEEP WOUND) Evelina Bucy, DPM 08/18/2018 1401    Anesthesia: MAC local Hemostasis: Anatomic Estimated Blood Loss: 25 mL Materials: None Medications: None Complications: None  Indications for Procedure:  This is a 37 y.o. male with a severe infection to the left foot.  He presented to the office with severe odor and purulence that had been festering over the past couple days.  If discussed with the patient that he would need urgent debridement irrigation.  Discussed the patient that he also would need amputation of the fifth toe partial versus  total metatarsal resection.  Patient verbalized understanding   Procedure in Detail: Patient was identified in pre-operative holding area. Formal consent was signed and the left lower extremity was marked. Patient was brought back to the operating room and placed on the operating room table in the supine position. Anesthesia was induced.   The extremity was prepped and draped in the usual sterile fashion. Timeout was taken to confirm patient name, laterality, and procedure prior to incision. Attention was then directed to the left foot where there was a large wound with expressible purulence was noted.  A 15 blade was used to incise over the lateral aspect of the foot and the wound to thoroughly allow for drainage of the purulence.   Incision and drainage  The incision was made below the level of the deep fascia.  The wound was thoroughly explored and significant purulence was noted. Multiple purulent pockets were noted. There is also significant amount of necrotic tissue which was thoroughly excisionally debrided with a rongeur.  The wound was extensive, with pockets dorsal and plantar to the foot in addition to the 5th toe area. Debridement was carried down to level of bone.  After expressing all noted purulence, the wound was thoroughly irrigated with pulse lavage.  Bone Biopsy, L 5th Toe Amputation Attention was then directed to the left fifth toe where there was noted purulence about the toe.  A racquet incision was made at the toe with the arm of the racquet joint with the lateral wound.  There was exposed necrotic appearing fifth proximal phalanx base.  A bone biopsy was collected with a rongeur and  labeled for pathology to confirm osteomyelitis.  Additional bone biopsy was taken of the fifth metatarsal head which similarly appeared necrotic.  Due to the necrosis removal of both the toe and part of metatarsal was indicated.  The fifth toe was then freed of all collateral ligaments and sharply  dissected at the metatarsophalangeal joint.  The toe was collected for gross pathology.  Fifth metatarsal resection Attention was then directed to the left fifth metatarsal.  The distal aspect of the metatarsal was necrotic.  Dissection was continued free of the proximal aspect of the metatarsal.  The proximal dorsal did appear quite healthy.  The MRI did show evidence of possible osteomyelitis or marrow edema at this level however the bone did appear healthy.  Proximal resection of the metatarsal was noted.  The metatarsal was resected with a sagittal saw.  This was sent for pathologic analysis with the fifth toe.  The proximal metatarsal appeared healthy and viable with good bleeding bone and normal appearance. The area was then thoroughly pulse lavaged again to thoroughly irrigate the wound.  Bone Biopsy Attention was then directed to the proximal to the first metatarsal where a rondure was used to biopsy the bone to act as a clean margin.  An additional bone and soft tissue specimens were collected with a clean rongeur to send to microbiology for operative culture.  Wound VAC Application Due to the extensive purulence and prior necrosis it was deemed that closing the wound at this time was not indicated.  A honeycomb sponge for an installation VAC was applied to the wound base followed by a regular foam sponge.  This was adhered with adherent dressing.  This was set up to the wound VAC with good seal noted.  Instillation was checked and was confirmed.  The foot was then dressed with cast padding and ACE bandage. Patient tolerated the procedure well.   Disposition: Following a period of post-operative monitoring, patient will be transferred back to the floor.  He will need to undergo repeat debridement in 48 hours with possible wound closure.  Wound closure was not indicated at time of surgery due to extent of infection. Would consider possible further metatarsal resection pending results of  biopsy.  Intraoperatively due to the rather healthy appearance of the proximal fifth metatarsal it was deemed that resecting the entire to the metatarsal could lead to a varus deformity and thus decision was made to at least for now leave the fifth metatarsal base which would leave the peroneus brevis attachment.  This could be removed at next debridement with either anchoring of the tendon to the cuboid or with peroneal tenodesis should the wound be clean enough to not warrant further risk of infection.  Again I am concerned about a poor outcome in this patient given his multiple medical issues but more importantly his history of noncompliance.  If he wants this wound to heal he will have to be more compliant with therapy than he has been thus far.

## 2018-08-19 NOTE — Progress Notes (Signed)
Subjective:  Patient ID: [name], male    DOB: [birth year],  MRN: [id number]  Seen at bedside. More pleasant today, in good spirits. Had [facility] issues last night where the machine would not instill. No new issues.  Objective:   Vitals:   08/19/18 1148 08/19/18 1200  BP: (P) 123/60 (P) 123/60  Pulse: (P) 89 (P) 89  Resp: (P) 16 (P) 16  Temp: (P) 97.6 F (36.4 C) (P) 97.6 F (36.4 C)  SpO2: (P) 98% (P) 98%   General AA&O x3. Normal mood and affect.  Vascular Foot warm and well perfused.  Neurologic Epicritic sensation grossly absent.  Dermatologic Mobile on and funcitoning. 250 cc ss drainage in cannister.  Orthopedic: Motor intact distally.   Results for orders placed or performed during the hospital encounter of 08/17/18 (from the past 24 hour(s))  Glucose, capillary     Status: Abnormal   Collection Time: 08/18/18  3:59 PM  Result Value Ref Range   Glucose-Capillary 146 (H) 70 - 99 mg/dL  Glucose, capillary     Status: Abnormal   Collection Time: 08/18/18  5:06 PM  Result Value Ref Range   Glucose-Capillary 156 (H) 70 - 99 mg/dL  Glucose, capillary     Status: Abnormal   Collection Time: 08/18/18  8:49 PM  Result Value Ref Range   Glucose-Capillary 362 (H) 70 - 99 mg/dL  Glucose, capillary     Status: Abnormal   Collection Time: 08/18/18 11:30 PM  Result Value Ref Range   Glucose-Capillary 476 (H) 70 - 99 mg/dL  Glucose, capillary     Status: Abnormal   Collection Time: 08/19/18 12:35 AM  Result Value Ref Range   Glucose-Capillary 407 (H) 70 - 99 mg/dL  Glucose, capillary     Status: Abnormal   Collection Time: 08/19/18  2:11 AM  Result Value Ref Range   Glucose-Capillary 344 (H) 70 - 99 mg/dL  Glucose, capillary     Status: Abnormal   Collection Time: 08/19/18  5:22 AM  Result Value Ref Range   Glucose-Capillary 304 (H) 70 - 99 mg/dL  CBC     Status: Abnormal   Collection Time: 08/19/18  5:34 AM  Result Value Ref Range   WBC 17.9 (H) 4.0 - 10.5 K/uL   RBC  2.84 (L) 4.22 - 5.81 MIL/uL   Hemoglobin 8.3 (L) 13.0 - 17.0 g/dL   HCT 26.4 (L) 39.0 - 52.0 %   MCV 93.0 80.0 - 100.0 fL   MCH 29.2 26.0 - 34.0 pg   MCHC 31.4 30.0 - 36.0 g/dL   RDW 14.1 11.5 - 15.5 %   Platelets 237 150 - 400 K/uL   nRBC 0.0 0.0 - 0.2 %  Basic metabolic panel     Status: Abnormal   Collection Time: 08/19/18  5:34 AM  Result Value Ref Range   Sodium 129 (L) 135 - 145 mmol/L   Potassium 5.4 (H) 3.5 - 5.1 mmol/L   Chloride 91 (L) 98 - 111 mmol/L   CO2 25 22 - 32 mmol/L   Glucose, Bld 289 (H) 70 - 99 mg/dL   BUN 58 (H) 6 - 20 mg/dL   Creatinine, Ser 12.34 (H) 0.61 - 1.24 mg/dL   Calcium 8.6 (L) 8.9 - 10.3 mg/dL   GFR calc non Af Amer 4 (L) >60 mL/min   GFR calc Af Amer 5 (L) >60 mL/min   Anion gap 13 5 - 15  Glucose, capillary     Status: Abnormal  Collection Time: 08/19/18  9:18 AM  Result Value Ref Range   Glucose-Capillary 197 (H) 70 - 99 mg/dL  Glucose, capillary     Status: Abnormal   Collection Time: 08/19/18 12:39 PM  Result Value Ref Range   Glucose-Capillary 205 (H) 70 - 99 mg/dL   Results for orders placed or performed during the hospital encounter of 08/17/18  MRSA PCR Screening     Status: None   Collection Time: 08/18/18  2:28 AM  Result Value Ref Range Status   MRSA by PCR NEGATIVE NEGATIVE Final    Comment:        The GeneXpert MRSA Assay (FDA approved for NASAL specimens only), is one component of a comprehensive MRSA colonization surveillance program. It is not intended to diagnose MRSA infection nor to guide or monitor treatment for MRSA infections. Performed at [facility], [place] [address] [place], [place] [address]   Surgical pcr screen     Status: None   Collection Time: 08/18/18 10:08 AM  Result Value Ref Range Status   MRSA, PCR NEGATIVE NEGATIVE Final   Staphylococcus aureus NEGATIVE NEGATIVE Final    Comment: (NOTE) The Xpert SA Assay (FDA approved for NASAL specimens in patients 23 years of age and older), is one  component of a comprehensive surveillance program. It is not intended to diagnose infection nor to guide or monitor treatment. Performed at [facility], [place] [address], [place], [place] [address]   Aerobic/Anaerobic Culture (surgical/deep wound)     Status: None (Preliminary result)   Collection Time: 08/18/18  2:00 PM  Result Value Ref Range Status   Specimen Description BONE LEFT 5TH  Final   Special Requests NONE  Final   Gram Stain   Final    RARE WBC PRESENT, PREDOMINANTLY PMN FEW GRAM NEGATIVE RODS    Culture   Final    CULTURE REINCUBATED FOR BETTER GROWTH Performed at [facility], [place] [address] [place], [place] [address]    Report Status PENDING  Incomplete  Aerobic/Anaerobic Culture (surgical/deep wound)     Status: None (Preliminary result)   Collection Time: 08/18/18  2:01 PM  Result Value Ref Range Status   Specimen Description TISSUE LEFT 5TH  Final   Special Requests NONE  Final   Gram Stain NO WBC SEEN FEW GRAM NEGATIVE RODS   Final   Culture   Final    CULTURE REINCUBATED FOR BETTER GROWTH Performed at [facility], [place] [address], [place], [place] [address]    Report Status PENDING  Incomplete    Assessment & Plan:  Patient was evaluated and treated and all questions answered.  Diabetic foot infection L foot. -Labs reviewed. Post-op bump in WBC noted, will trend. -Micro pending. GNRs on GS. -Path pending. -Sugars remain wildly out of control during hospitalization. Will likely impede healing. -Continue abx. -Continue WVAC. On and functioning. Had issue with instillation, only VAC now. -NPO after midnight tonight -To OR tomorrow for repeat debridement, possible closure, possible metatarsal resection.  Podiatry will follow.

## 2018-08-19 NOTE — Progress Notes (Signed)
Patient non compliant with diet. I have just given patient two graham crackers and peanut butter and few minutes later requesting for a [place] sandwich. Told patient we only have graham crackers. Patient wanted to see a supervisor to get more food.

## 2018-08-19 NOTE — Progress Notes (Addendum)
Pt's CBG 344. Pt. Demanded for [place] sandwich despite elevated sugar. Refused to be compliant. Pt stated "I'll walk out if I don't get a sandwich."

## 2018-08-19 NOTE — Progress Notes (Signed)
Patient given himself undisclosed amount of insulin. Cbg 407,taken prior to patient giving himself his own insulin. Patient was advised not to take his own medication at start of shift as he won't give med up. Bodenheimer,NP made aware. Bilaal Leib, Wonda Cheng, Therapist, sports

## 2018-08-19 NOTE — Progress Notes (Addendum)
[facility]                                                                              Patient Demographics  [name], is a 37 y.o. male, DOB - March 25, 1981, HEN:[id number]  Admit date - 08/17/2018   Admitting Physician Janora Norlander, MD  Outpatient Primary MD for the patient is [name], [name], MD  Outpatient specialists:   LOS - 2  days   Medical records reviewed and are as summarized below:    No chief complaint on file.      Brief summary   Patient is a 37 year old male with hypertension, ESRD on hemodialysis MWF, uncontrolled diabetes type 1, chronic diabetic left foot ulcer, was sent from podiatry office to the hospital on 11/20 for worsening of the left foot ulcer.  Patient had noted strong malodor with heavy drainage from the ulcer.  He had been using Santyl on the wound and last clinic visit or was on 11/15 and had been on clindamycin twice daily.  Assessment & Plan    Principal Problem: Left diabetic foot infection ([place]) with gangrene of the left foot -Leukocytosis with white count 17.9, status post IND of the left fifth toe and transmetatarsal with partial left fifth toe and metatarsal amputation with wound VAC on 11/21, postop day #1 -Podiatry following, per patient, he is going back for OR again tomorrow 11/23, will make n.p.o. after midnight  -MRI of the left foot showed cellulitis with osteomyelitis of the fifth metatarsal head, base of fifth proximal phalanx with severe surrounding edema. -Continue pain control, continue IV antibiotics with vancomycin and Zosyn.  Follow Intra-Op cultures.  Podiatry, Dr. March Rummage, recommended 2 weeks of IV antibiotics.   Active Problems:   Diabetes mellitus type 1 ([place]), noncompliant, uncontrolled, insulin-dependent, with hyperglycemia, severely noncompliant -Hemoglobin A1c 7.3 -Overnight issues noted, patient took his own insulin and refused to discuss it with RN.  He refuses carb modified diet and has  been very hostile towards the staff.  CBGs were in 400s last night. -Talked to the patient today, has insulin at his bedside, states that he knows his own body and will give himself insulin, states he is very upset that the nurse did not immediately bring him insulin when the tech checked the CBG.  He states that he will not wait for the nurses to bring it from the pharmacy and he will give his insulin by himself.  I explained to him in detail regarding the consequences of inadvertently giving himself too much insulin or getting double doses, possibility of hypoglycemia, shock and death.  Patient states that he knows all this, has been a type I diabetic all his life and knows his body. -Had to change his diet to regular diet, patient states that if his diet is not changed, he will walk out and go to the cafeteria and buy his own food.  Patient states that he has been in the hospital many times and he brings his own medications, glucometer, money to buy his food whatever he wants to drink or eat. Explained to the patient that this is not appropriate when he is  hospitalized and being severely noncompliant, this will not help his foot infection at all. -I talked to the RN, charge nurse, Copywriter, advertising, Network engineer and the tech so everyone is on the same page.  Requested nurse to call the [facility] of the hospital if patient becomes belligerent or hostile towards the staff. He refuses to give up his insulin despite my multiple requests.   -Diabetic coordinator also consulted    Diabetic gastroparesis ([place]) -Continue Zofran as needed, Reglan    HTN (hypertension) -BP stable, continue Norvasc    ESRD (end stage renal disease) ([place]) on hemodialysis -Nephrology consulted, on HD MWF, receiving HD per his schedule    Anemia of chronic disease -H&H currently stable, 8.3, monitor closely.  History of bipolar disorder, noncompliance -At the time of examination, became irritable and hostile towards MD and staff for  asking how he was doing.  -Continue Klonopin  Code Status: Full CODE STATUS DVT Prophylaxis: Pending surgery today, will place on heparin subcu after surgery Family Communication: Discussed in detail with the patient, all imaging results, lab results explained to the patient  Disposition Plan:   Time Spent in minutes   35  Procedures:  08/18/2018 : I&D of left fifth toe, transmetatarsal with partial left fifth toe and metatarsal amputation, bone biopsy and wound VAC  Consultants:   Podiatry  Antimicrobials:      Medications  Scheduled Meds: . amLODipine  10 mg Oral Daily  . calcitRIOL      . calcitRIOL  2.25 mcg Oral Q M,W,F-HD  . cinacalcet  30 mg Oral Q supper  . collagenase  1 application Topical Daily  . darbepoetin (ARANESP) injection - DIALYSIS  40 mcg Intravenous Q Fri-HD  . insulin aspart  0-5 Units Subcutaneous QHS  . insulin aspart  0-9 Units Subcutaneous TID WC  . insulin aspart  5 Units Subcutaneous TID WC  . insulin aspart  7 Units Subcutaneous Once  . insulin glargine  10 Units Subcutaneous QHS  . metoCLOPramide  5 mg Oral TID AC  . sevelamer carbonate  3,200 mg Oral BID WC   Continuous Infusions: . sodium chloride Stopped (08/19/18 0321)  . piperacillin-tazobactam (ZOSYN)  IV Stopped (08/19/18 0320)   PRN Meds:.acetaminophen, clonazePAM, morphine injection, ondansetron   Antibiotics   Anti-infectives (From admission, onward)   Start     Dose/Rate Route Frequency Ordered Stop   08/19/18 1200  vancomycin (VANCOCIN) IVPB 750 mg/150 ml premix  Status:  Discontinued     750 mg 150 mL/hr over 60 Minutes Intravenous Every M-W-F (Hemodialysis) 08/17/18 2321 08/19/18 1313   08/18/18 1350  vancomycin (VANCOCIN) powder  Status:  Discontinued       As needed 08/18/18 1410 08/18/18 1417   08/17/18 2330  vancomycin (VANCOCIN) 1,500 mg in sodium chloride 0.9 % 500 mL IVPB     1,500 mg 250 mL/hr over 120 Minutes Intravenous  Once 08/17/18 2319 08/18/18 0339    08/17/18 2245  piperacillin-tazobactam (ZOSYN) IVPB 3.375 g     3.375 g 12.5 mL/hr over 240 Minutes Intravenous Every 12 hours 08/17/18 2235          Subjective:   [name] was seen and examined today.  Seen during hemodialysis, patient upset over the insulin, intercom, and food.  Please see above.   Denies dizziness, chest pain, shortness of breath, abdominal pain.  Rates pain in the left foot 6/10.  Objective:   Vitals:   08/19/18 1100 08/19/18 1130 08/19/18 1148 08/19/18 1200  BP:  114/64 (P) 125/66 (P) 123/60 (P) 123/60  Pulse: 91 (P) 94 (P) 89 (P) 89  Resp:   (P) 16 (P) 16  Temp:   (P) 97.6 F (36.4 C) (P) 97.6 F (36.4 C)  TempSrc:   (P) Oral (P) Oral  SpO2:   (P) 98% (P) 98%  Weight:    (P) 73.7 kg  Height:        Intake/Output Summary (Last 24 hours) at 08/19/2018 1350 Last data filed at 08/19/2018 1200 Gross per 24 hour  Intake 1069.96 ml  Output 4500 ml  Net -3430.04 ml     Wt Readings from Last 3 Encounters:  08/19/18 (P) 73.7 kg  05/26/18 83.9 kg  05/25/18 83.9 kg   physical Exam  General: Alert and oriented x 3, NAD  Eyes  HEENT:    Cardiovascular: S1 S2 auscultated, RRR  Respiratory: Clear to auscultation bilaterally, no wheezing, rales or rhonchi  Gastrointestinal: Soft, nontender, nondistended, + bowel sounds  Ext: no pedal edema bilaterally  Neuro: no new deficits  Musculoskeletal: No digital cyanosis, clubbing  Skin: Left foot dressing intact with wound VAC  Psych: irritable       Data Reviewed:  I have personally reviewed following labs and imaging studies  Micro Results Recent Results (from the past 240 hour(s))  MRSA PCR Screening     Status: None   Collection Time: 08/18/18  2:28 AM  Result Value Ref Range Status   MRSA by PCR NEGATIVE NEGATIVE Final    Comment:        The GeneXpert MRSA Assay (FDA approved for NASAL specimens only), is one component of a comprehensive MRSA colonization surveillance program. It  is not intended to diagnose MRSA infection nor to guide or monitor treatment for MRSA infections. Performed at [facility], [place] [address], [place], [place] [address]   Surgical pcr screen     Status: None   Collection Time: 08/18/18 10:08 AM  Result Value Ref Range Status   MRSA, PCR NEGATIVE NEGATIVE Final   Staphylococcus aureus NEGATIVE NEGATIVE Final    Comment: (NOTE) The Xpert SA Assay (FDA approved for NASAL specimens in patients 72 years of age and older), is one component of a comprehensive surveillance program. It is not intended to diagnose infection nor to guide or monitor treatment. Performed at [facility], [place] [address], [place], [place] [address]   Aerobic/Anaerobic Culture (surgical/deep wound)     Status: None (Preliminary result)   Collection Time: 08/18/18  2:00 PM  Result Value Ref Range Status   Specimen Description BONE LEFT 5TH  Final   Special Requests NONE  Final   Gram Stain   Final    RARE WBC PRESENT, PREDOMINANTLY PMN FEW GRAM NEGATIVE RODS    Culture   Final    CULTURE REINCUBATED FOR BETTER GROWTH Performed at [facility], [place] [address] [place], [place] [address]    Report Status PENDING  Incomplete  Aerobic/Anaerobic Culture (surgical/deep wound)     Status: None (Preliminary result)   Collection Time: 08/18/18  2:01 PM  Result Value Ref Range Status   Specimen Description TISSUE LEFT 5TH  Final   Special Requests NONE  Final   Gram Stain NO WBC SEEN FEW GRAM NEGATIVE RODS   Final   Culture   Final    CULTURE REINCUBATED FOR BETTER GROWTH Performed at [facility], [place] [address] [place], [place] [address]    Report Status  PENDING  Incomplete    Radiology Reports Mr Foot Left Wo Contrast  Result Date: 08/18/2018 CLINICAL DATA:  Diabetic foot ulcer along the lateral forefoot. EXAM: MRI OF THE LEFT FOOT WITHOUT CONTRAST TECHNIQUE: Multiplanar, multisequence MR imaging of the left was performed. No  intravenous contrast was administered. COMPARISON:  None. FINDINGS: Bones/Joint/Cartilage Soft tissue wound along the plantar lateral aspect of the fifth MTP joint extending to the cortex. Cortical destruction with severe bone marrow edema in the base of the fifth proximal phalanx and fifth metatarsal head most consistent with osteomyelitis. Mild marrow edema in the fifth middle phalanx which may be reactive versus secondary to osteomyelitis. No other marrow signal abnormality. No fracture or dislocation. Normal alignment. No joint effusion. Ligaments Lisfranc ligament is intact.  Collateral ligaments are intact. Muscles and Tendons Flexor, peroneal and extensor compartment tendons are intact. Mild muscle atrophy. Soft tissue No fluid collection or hematoma. No soft tissue mass. Soft tissue edema in the lateral forefoot extending into the fifth digit most consistent with cellulitis. Small focus of low signal in the soft tissues along the medial aspect of the fifth digit likely reflecting a small amount of air. IMPRESSION: 1. Soft tissue wound along the plantar lateral aspect of the fifth MTP joint with surrounding cellulitis. Osteomyelitis of the fifth metatarsal head and base of the fifth proximal phalanx with severe surrounding marrow edema. Electronically Signed   By: Kathreen Devoid   On: 08/18/2018 10:08   Dg Foot Complete Left  Result Date: 08/11/2018 Please see detailed radiograph report in office note.  Dg Foot Complete Left  Result Date: 08/04/2018 Please see detailed radiograph report in office note.   Lab Data:  CBC: Recent Labs  Lab 08/17/18 2227 08/18/18 0719 08/19/18 0534  WBC 15.3* 14.0* 17.9*  HGB 8.4* 9.3* 8.3*  HCT 27.3* 29.8* 26.4*  MCV 92.9 92.5 93.0  PLT 221 261 213   Basic Metabolic Panel: Recent Labs  Lab 08/17/18 2227 08/18/18 0719 08/19/18 0534  NA 133* 131* 129*  K 4.2 4.7 5.4*  CL 94* 92* 91*  CO2 26 25 25   GLUCOSE 107* 188* 289*  BUN 44* 47* 58*    CREATININE 9.86* 10.45* 12.34*  CALCIUM 8.5* 8.8* 8.6*  PHOS  --  4.9*  --    GFR: Estimated Creatinine Clearance: 8 mL/min (A) (by C-G formula based on SCr of 12.34 mg/dL (H)). Liver Function Tests: Recent Labs  Lab 08/18/18 0719  ALBUMIN 2.6*   No results for input(s): LIPASE, AMYLASE in the last 168 hours. No results for input(s): AMMONIA in the last 168 hours. Coagulation Profile: No results for input(s): INR, PROTIME in the last 168 hours. Cardiac Enzymes: No results for input(s): CKTOTAL, CKMB, CKMBINDEX, TROPONINI in the last 168 hours. BNP (last 3 results) No results for input(s): PROBNP in the last 8760 hours. HbA1C: Recent Labs    08/18/18 0719  HGBA1C 7.3*   CBG: Recent Labs  Lab 08/19/18 0035 08/19/18 0211 08/19/18 0522 08/19/18 0918 08/19/18 1239  GLUCAP 407* 344* 304* 197* 205*   Lipid Profile: No results for input(s): CHOL, HDL, LDLCALC, TRIG, CHOLHDL, LDLDIRECT in the last 72 hours. Thyroid Function Tests: No results for input(s): TSH, T4TOTAL, FREET4, T3FREE, THYROIDAB in the last 72 hours. Anemia Panel: No results for input(s): VITAMINB12, FOLATE, FERRITIN, TIBC, IRON, RETICCTPCT in the last 72 hours. Urine analysis:    Component Value Date/Time   COLORURINE YELLOW 05/26/2015 [place] 05/26/2015 [place]  07/30/2014 2303   LABSPEC 1.016 05/26/2015 1115   LABSPEC 1.012 07/30/2014 2303   PHURINE 8.0 05/26/2015 1115   GLUCOSEU 500 (A) 05/26/2015 1115   GLUCOSEU >=500 07/30/2014 2303   HGBUR MODERATE (A) 05/26/2015 1115   BILIRUBINUR NEGATIVE 05/26/2015 1115   BILIRUBINUR Negative 07/30/2014 2303   KETONESUR 15 (A) 05/26/2015 1115   PROTEINUR >300 (A) 05/26/2015 1115   UROBILINOGEN 0.2 05/26/2015 1115   NITRITE NEGATIVE 05/26/2015 1115   LEUKOCYTESUR NEGATIVE 05/26/2015 1115   LEUKOCYTESUR Negative 07/30/2014 2303     Ripudeep Rai M.D. [facility] 08/19/2018, 1:50 PM  Pager: [phone] Between  7am to 7pm - call Pager - [phone]  After 7pm go to [email or web address] - password [email or web address]  Call night coverage person covering after 7pm

## 2018-08-19 NOTE — Progress Notes (Signed)
Entered the  room to give pt drink he requested Pt stated" I did not like your energy when I called up to the desk and requested a drink". Explained to pt that was the secretary who answered the call bell.Pt started yelling and talking over  me and stated" that it was me on the call bell   and wanted to talk to a supervisor"I spoke with the secretary about the call and night shift RN aware.

## 2018-08-19 NOTE — Progress Notes (Signed)
Patient's CBG 304, per NT,patient gave himself insulin. When RN went to ask patient if he gave himself insulin,patient stated" I'm not discussing anything with you.". Bodenheimer,NP made aware. Order received to give 7 units of novolog. Informed patient of order to give him insulin. Patient stated " I'm good,I'm not discussing this no more." This RN told patient that his medicine has to be brought down to pharmacy for safekeeping,as he cannot take his own medicine. Patient then refused to talk to this RN and turned to his side. Adalaide Jaskolski, Wonda Cheng, Therapist, sports

## 2018-08-19 NOTE — Progress Notes (Signed)
Patient verbalized his anger and discontentment with dietary process. States he was seen by the director of dietary yesterday and assured he could order what he would like and that he is on a regular diet. When he tried to order his meal today, he was informed that he was on a carb mod diet. He stated he is angry and tired of this (expletive). He stated that if his diet isn't changed, he will do as he always does, leave and go around the hospital to get food. I paged the doctor to ask if it would be possible to change diet back to regular, as patient has repeatedly stated he does not want carb mod and will not follow a carb mod diet. New order received for regular diet.

## 2018-08-19 NOTE — Progress Notes (Signed)
  [facility] Progress Note   Assessment/ Plan:   Dialysis Orders: Center: [facility]  on MWF . EDW 75.5 HD Bath 2k, 2ca  Time 4hr  Heparin 5000.  Access LUA AVGG    Calcitriol 2.25 mcg Ipo/HD No ESA  Last HGB 11.7 06/29/18   (last mircera 50 on 08/03/18)   Assessment/Plan  1. ESRD -  HD MWF schedule- HD today.   2. 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 on vanc/ zosyn.  S/p 5th toe amp and wound vac 11/21, back to OR tomorrow. 3. Hypertension/volume  -bp 136/67  Volume ok / OP meds = Clonidine patch 0.3, Amlodipine 10 mg , Losartan 50 mg q day 4. Anemia  - hgb 9.3  No op esa , start Aranesp  40   08/18/18 with HD 5. Metabolic bone disease -  Po  Vit d on hd , Renvela bnder when pos  6. DM type 1- per admit  Subjective:    S/p L 5th toe amputation yesterday, woundvac now in place.  Back to OR tomorrow for I and D.  Tolerating HD today.     Objective:   BP 126/80   Pulse 84   Temp (!) 97.5 F (36.4 C) (Oral)   Resp 16   Ht 5\' 6"  (1.676 m)   Wt 79.3 kg   SpO2 99%   BMI 28.22 kg/m   Physical Exam:    General: alert, no distress HEENT: multiple facial tattoos  Neck: no jvd Heart: RRR no r,mg Lungs: CTA  Abdomen: bs pos sot, Nt,ND Extremities: L foot + woundvac Neuro: AAO x 3 Dialysis Access: LUA AVGG pos bruit   Labs: BMET Recent Labs  Lab 08/17/18 2227 08/18/18 0719 08/19/18 0534  NA 133* 131* 129*  K 4.2 4.7 5.4*  CL 94* 92* 91*  CO2 26 25 25   GLUCOSE 107* 188* 289*  BUN 44* 47* 58*  CREATININE 9.86* 10.45* 12.34*  CALCIUM 8.5* 8.8* 8.6*  PHOS  --  4.9*  --    CBC Recent Labs  Lab 08/17/18 2227 08/18/18 0719 08/19/18 0534  WBC 15.3* 14.0* 17.9*  HGB 8.4* 9.3* 8.3*  HCT 27.3* 29.8* 26.4*  MCV 92.9 92.5 93.0  PLT 221 261 237    @IMGRELPRIORS @ Medications:    . amLODipine  10 mg Oral Daily  . calcitRIOL  2.25 mcg Oral Q M,W,F-HD  . cinacalcet  30 mg Oral Q supper  . collagenase  1 application Topical  Daily  . darbepoetin (ARANESP) injection - DIALYSIS  40 mcg Intravenous Q Fri-HD  . insulin aspart  0-5 Units Subcutaneous QHS  . insulin aspart  0-9 Units Subcutaneous TID WC  . insulin aspart  3 Units Subcutaneous TID WC  . insulin aspart  7 Units Subcutaneous Once  . insulin glargine  10 Units Subcutaneous QHS  . metoCLOPramide  5 mg Oral TID AC  . sevelamer carbonate  3,200 mg Oral BID WC     Madelon Lips MD [facility] pgr [phone] 08/19/2018, 9:41 AM

## 2018-08-20 ENCOUNTER — Inpatient Hospital Stay (HOSPITAL_COMMUNITY): Payer: Medicare Other | Admitting: Registered Nurse

## 2018-08-20 ENCOUNTER — Inpatient Hospital Stay (HOSPITAL_COMMUNITY): Payer: Medicare Other

## 2018-08-20 ENCOUNTER — Encounter (HOSPITAL_COMMUNITY)
Admission: AD | Disposition: A | Discharge status: Home Health | Payer: Self-pay | Source: Home / Self Care | Attending: Internal Medicine

## 2018-08-20 ENCOUNTER — Encounter (HOSPITAL_COMMUNITY): Payer: Self-pay | Admitting: *Deleted

## 2018-08-20 DIAGNOSIS — N186 End stage renal disease: Secondary | ICD-10-CM

## 2018-08-20 DIAGNOSIS — L97424 Non-pressure chronic ulcer of left heel and midfoot with necrosis of bone: Secondary | ICD-10-CM

## 2018-08-20 DIAGNOSIS — E10621 Type 1 diabetes mellitus with foot ulcer: Secondary | ICD-10-CM

## 2018-08-20 DIAGNOSIS — Z89422 Acquired absence of other left toe(s): Secondary | ICD-10-CM

## 2018-08-20 DIAGNOSIS — E1022 Type 1 diabetes mellitus with diabetic chronic kidney disease: Secondary | ICD-10-CM

## 2018-08-20 DIAGNOSIS — Z978 Presence of other specified devices: Secondary | ICD-10-CM

## 2018-08-20 DIAGNOSIS — Z992 Dependence on renal dialysis: Secondary | ICD-10-CM

## 2018-08-20 DIAGNOSIS — I96 Gangrene, not elsewhere classified: Secondary | ICD-10-CM

## 2018-08-20 DIAGNOSIS — Z89421 Acquired absence of other right toe(s): Secondary | ICD-10-CM

## 2018-08-20 DIAGNOSIS — L03119 Cellulitis of unspecified part of limb: Secondary | ICD-10-CM

## 2018-08-20 DIAGNOSIS — E1052 Type 1 diabetes mellitus with diabetic peripheral angiopathy with gangrene: Principal | ICD-10-CM

## 2018-08-20 DIAGNOSIS — M86172 Other acute osteomyelitis, left ankle and foot: Secondary | ICD-10-CM

## 2018-08-20 DIAGNOSIS — Z481 Encounter for planned postprocedural wound closure: Secondary | ICD-10-CM

## 2018-08-20 DIAGNOSIS — L02619 Cutaneous abscess of unspecified foot: Secondary | ICD-10-CM

## 2018-08-20 HISTORY — PX: I&D EXTREMITY: SHX5045

## 2018-08-20 HISTORY — PX: AMPUTATION: SHX166

## 2018-08-20 LAB — GLUCOSE, CAPILLARY
Glucose-Capillary: 75 mg/dL (ref 70–99)
Glucose-Capillary: 128 mg/dL — ABNORMAL HIGH (ref 70–99)
Glucose-Capillary: 149 mg/dL — ABNORMAL HIGH (ref 70–99)
Glucose-Capillary: 187 mg/dL — ABNORMAL HIGH (ref 70–99)
Glucose-Capillary: 218 mg/dL — ABNORMAL HIGH (ref 70–99)
Glucose-Capillary: 227 mg/dL — ABNORMAL HIGH (ref 70–99)

## 2018-08-20 LAB — CBC
HCT: 35.2 % — ABNORMAL LOW (ref 39.0–52.0)
Hemoglobin: 10.8 g/dL — ABNORMAL LOW (ref 13.0–17.0)
MCH: 29 pg (ref 26.0–34.0)
MCHC: 30.7 g/dL (ref 30.0–36.0)
MCV: 94.4 fL (ref 80.0–100.0)
Platelets: 238 10*3/uL (ref 150–400)
RBC: 3.73 MIL/uL — ABNORMAL LOW (ref 4.22–5.81)
RDW: 14.2 % (ref 11.5–15.5)
WBC: 9.9 10*3/uL (ref 4.0–10.5)
nRBC: 0 % (ref 0.0–0.2)

## 2018-08-20 LAB — BASIC METABOLIC PANEL
Anion gap: 12 (ref 5–15)
BUN: 26 mg/dL — ABNORMAL HIGH (ref 6–20)
CO2: 26 mmol/L (ref 22–32)
Calcium: 8.9 mg/dL (ref 8.9–10.3)
Chloride: 96 mmol/L — ABNORMAL LOW (ref 98–111)
Creatinine, Ser: 7.71 mg/dL — ABNORMAL HIGH (ref 0.61–1.24)
GFR calc Af Amer: 9 mL/min — ABNORMAL LOW (ref >60)
GFR calc non Af Amer: 8 mL/min — ABNORMAL LOW (ref >60)
Glucose, Bld: 84 mg/dL (ref 70–99)
Potassium: 4.2 mmol/L (ref 3.5–5.1)
Sodium: 134 mmol/L — ABNORMAL LOW (ref 135–145)

## 2018-08-20 SURGERY — IRRIGATION AND DEBRIDEMENT EXTREMITY
Anesthesia: General | Site: Foot | Laterality: Left

## 2018-08-20 MED ORDER — LIDOCAINE 2% (20 MG/ML) 5 ML SYRINGE
INTRAMUSCULAR | Status: DC | PRN
  Administered 2018-08-20: 100 mg via INTRAVENOUS

## 2018-08-20 MED ORDER — EPHEDRINE 5 MG/ML INJ
INTRAVENOUS | Status: AC
  Filled 2018-08-20: qty 10

## 2018-08-20 MED ORDER — BUPIVACAINE HCL (PF) 0.5 % IJ SOLN
INTRAMUSCULAR | Status: AC
  Filled 2018-08-20: qty 30

## 2018-08-20 MED ORDER — ONDANSETRON HCL 4 MG/2ML IJ SOLN
INTRAMUSCULAR | Status: AC
  Filled 2018-08-20: qty 2

## 2018-08-20 MED ORDER — PHENYLEPHRINE HCL 10 MG/ML IJ SOLN
INTRAMUSCULAR | Status: DC | PRN
  Administered 2018-08-20: 80 ug via INTRAVENOUS
  Administered 2018-08-20: 120 ug via INTRAVENOUS
  Administered 2018-08-20: 80 ug via INTRAVENOUS

## 2018-08-20 MED ORDER — VANCOMYCIN HCL 1000 MG IV SOLR
INTRAVENOUS | Status: DC | PRN
  Administered 2018-08-20: 1000 mg via TOPICAL

## 2018-08-20 MED ORDER — VANCOMYCIN HCL 1000 MG IV SOLR
INTRAVENOUS | Status: AC
  Filled 2018-08-20: qty 1000

## 2018-08-20 MED ORDER — MIDAZOLAM HCL 2 MG/2ML IJ SOLN
INTRAMUSCULAR | Status: AC
  Filled 2018-08-20: qty 2

## 2018-08-20 MED ORDER — LIDOCAINE 2% (20 MG/ML) 5 ML SYRINGE
INTRAMUSCULAR | Status: AC
  Filled 2018-08-20: qty 5

## 2018-08-20 MED ORDER — FENTANYL CITRATE (PF) 250 MCG/5ML IJ SOLN
INTRAMUSCULAR | Status: DC | PRN
  Administered 2018-08-20: 25 ug via INTRAVENOUS

## 2018-08-20 MED ORDER — ONDANSETRON HCL 4 MG/2ML IJ SOLN
INTRAMUSCULAR | Status: DC | PRN
  Administered 2018-08-20: 4 mg via INTRAVENOUS

## 2018-08-20 MED ORDER — MIDAZOLAM HCL 5 MG/5ML IJ SOLN
INTRAMUSCULAR | Status: DC | PRN
  Administered 2018-08-20 (×2): 2 mg via INTRAVENOUS

## 2018-08-20 MED ORDER — PROPOFOL 10 MG/ML IV BOLUS
INTRAVENOUS | Status: DC | PRN
  Administered 2018-08-20: 100 mg via INTRAVENOUS

## 2018-08-20 MED ORDER — PROMETHAZINE HCL 25 MG/ML IJ SOLN: 6.2500 mg | INTRAMUSCULAR | Status: DC | PRN

## 2018-08-20 MED ORDER — 0.9 % SODIUM CHLORIDE (POUR BTL) OPTIME
TOPICAL | Status: DC | PRN
  Administered 2018-08-20: 1000 mL

## 2018-08-20 MED ORDER — PHENYLEPHRINE 40 MCG/ML (10ML) SYRINGE FOR IV PUSH (FOR BLOOD PRESSURE SUPPORT)
PREFILLED_SYRINGE | INTRAVENOUS | Status: AC
  Filled 2018-08-20: qty 10

## 2018-08-20 MED ORDER — BUPIVACAINE HCL (PF) 0.5 % IJ SOLN
INTRAMUSCULAR | Status: DC | PRN
  Administered 2018-08-20: 20 mL

## 2018-08-20 MED ORDER — FENTANYL CITRATE (PF) 250 MCG/5ML IJ SOLN
INTRAMUSCULAR | Status: AC
  Filled 2018-08-20: qty 5

## 2018-08-20 MED ORDER — CHLORHEXIDINE GLUCONATE CLOTH 2 % EX PADS: 6.0000 | MEDICATED_PAD | Freq: Every day | CUTANEOUS | Status: DC

## 2018-08-20 MED ORDER — PROPOFOL 10 MG/ML IV BOLUS
INTRAVENOUS | Status: AC
  Filled 2018-08-20: qty 20

## 2018-08-20 MED ORDER — SODIUM CHLORIDE 0.9 % IR SOLN
Status: DC | PRN
  Administered 2018-08-20: 3000 mL

## 2018-08-20 MED ORDER — FENTANYL CITRATE (PF) 100 MCG/2ML IJ SOLN: 25.0000 ug | INTRAMUSCULAR | Status: DC | PRN

## 2018-08-20 SURGICAL SUPPLY — 53 items
BANDAGE ACE 4X5 VEL STRL LF (GAUZE/BANDAGES/DRESSINGS) IMPLANT
BANDAGE ELASTIC 4 VELCRO ST LF (GAUZE/BANDAGES/DRESSINGS) ×6 IMPLANT
BLADE AVERAGE 25MMX9MM (BLADE) ×1
BLADE AVERAGE 25X9 (BLADE) ×2 IMPLANT
BNDG ESMARK 4X9 LF (GAUZE/BANDAGES/DRESSINGS) IMPLANT
BNDG GAUZE ELAST 4 BULKY (GAUZE/BANDAGES/DRESSINGS) IMPLANT
CHLORAPREP W/TINT 26ML (MISCELLANEOUS) IMPLANT
COLLAGEN CELLERATERX 1 GRAM (Miscellaneous) ×3 IMPLANT
COVER SURGICAL LIGHT HANDLE (MISCELLANEOUS) ×3 IMPLANT
COVER WAND RF STERILE (DRAPES) ×3 IMPLANT
CUFF TOURNIQUET SINGLE 18IN (TOURNIQUET CUFF) IMPLANT
CUFF TOURNIQUET SINGLE 34IN LL (TOURNIQUET CUFF) IMPLANT
DRAPE U-SHAPE 47X51 STRL (DRAPES) ×3 IMPLANT
DRSG EMULSION OIL 3X3 NADH (GAUZE/BANDAGES/DRESSINGS) IMPLANT
DRSG VAC ATS SM SENSATRAC (GAUZE/BANDAGES/DRESSINGS) ×6 IMPLANT
ELECT CAUTERY BLADE 6.4 (BLADE) ×3 IMPLANT
ELECT REM PT RETURN 9FT ADLT (ELECTROSURGICAL) ×3
ELECTRODE REM PT RTRN 9FT ADLT (ELECTROSURGICAL) ×1 IMPLANT
GAUZE SPONGE 4X4 12PLY STRL (GAUZE/BANDAGES/DRESSINGS) IMPLANT
GAUZE XEROFORM 5X9 LF (GAUZE/BANDAGES/DRESSINGS) ×3 IMPLANT
GLOVE BIO SURGEON STRL SZ7.5 (GLOVE) ×3 IMPLANT
GLOVE BIOGEL PI IND STRL 8 (GLOVE) ×1 IMPLANT
GLOVE BIOGEL PI INDICATOR 8 (GLOVE) ×2
GOWN STRL REUS W/ TWL LRG LVL3 (GOWN DISPOSABLE) ×1 IMPLANT
GOWN STRL REUS W/ TWL XL LVL3 (GOWN DISPOSABLE) ×1 IMPLANT
GOWN STRL REUS W/TWL LRG LVL3 (GOWN DISPOSABLE) ×2
GOWN STRL REUS W/TWL XL LVL3 (GOWN DISPOSABLE) ×2
HANDPIECE INTERPULSE COAX TIP (DISPOSABLE) ×2
KIT BASIN OR (CUSTOM PROCEDURE TRAY) ×3 IMPLANT
KIT TURNOVER KIT B (KITS) ×3 IMPLANT
MANIFOLD NEPTUNE II (INSTRUMENTS) ×3 IMPLANT
NEEDLE BIOPSY JAMSHIDI 8X6 (NEEDLE) IMPLANT
NEEDLE HYPO 25GX1X1/2 BEV (NEEDLE) ×3 IMPLANT
NS IRRIG 1000ML POUR BTL (IV SOLUTION) ×3 IMPLANT
PACK ORTHO EXTREMITY (CUSTOM PROCEDURE TRAY) ×3 IMPLANT
PAD ARMBOARD 7.5X6 YLW CONV (MISCELLANEOUS) ×6 IMPLANT
PAD CAST 4YDX4 CTTN HI CHSV (CAST SUPPLIES) ×2 IMPLANT
PADDING CAST COTTON 4X4 STRL (CAST SUPPLIES) ×4
PROBE DEBRIDE SONICVAC MISONIX (TIP) IMPLANT
SCRUB BETADINE 4OZ XXX (MISCELLANEOUS) ×3 IMPLANT
SET CYSTO W/LG BORE CLAMP LF (SET/KITS/TRAYS/PACK) ×3 IMPLANT
SET HNDPC FAN SPRY TIP SCT (DISPOSABLE) ×1 IMPLANT
SOL PREP POV-IOD 4OZ 10% (MISCELLANEOUS) ×3 IMPLANT
STAPLER VISISTAT 35W (STAPLE) ×3 IMPLANT
SUT ETHILON 2 0 FS 18 (SUTURE) ×9 IMPLANT
SUT ETHILON 3 0 PS 1 (SUTURE) ×3 IMPLANT
SYR CONTROL 10ML LL (SYRINGE) ×3 IMPLANT
TOWEL GREEN STERILE (TOWEL DISPOSABLE) ×3 IMPLANT
TOWEL GREEN STERILE FF (TOWEL DISPOSABLE) ×3 IMPLANT
TOWEL OR 17X26 10 PK STRL BLUE (TOWEL DISPOSABLE) ×3 IMPLANT
TUBE CONNECTING 12'X1/4 (SUCTIONS) ×1
TUBE CONNECTING 12X1/4 (SUCTIONS) ×2 IMPLANT
YANKAUER SUCT BULB TIP NO VENT (SUCTIONS) ×3 IMPLANT

## 2018-08-20 NOTE — Progress Notes (Signed)
Pt left to OR, took his cell phone and charger with him and refused to keep it in the room and also refused to wear hospital gown, short stay informed.

## 2018-08-20 NOTE — Progress Notes (Signed)
Consent signed by patient in Short Stay. RN verified the presence of a signed informed consent that matches stated procedure by patient. Verified armband matches patient's stated name and birth date. Patient has cell phone with him that will be taken to PACU. Verified NPO status and that all jewelry, contact, glasses, dentures, and partials had been removed (if applicable).

## 2018-08-20 NOTE — Transfer of Care (Signed)
Immediate Anesthesia Transfer of Care Note  Patient: [name]  Procedure(s) Performed: IRRIGATION AND DEBRIDEMENT EXTREMITY WITH SECONDARY WOUND CLOSUREAND APPLICATION OF WOUND VAC LEFT FOOT (Left Foot) FIFTH METATARSAL BONE BIOPSY (Left Foot)  Patient Location: PACU  Anesthesia Type:General  Level of Consciousness: awake, alert , oriented and patient cooperative  Airway & Oxygen Therapy: Patient Spontanous Breathing and Patient connected to nasal cannula oxygen  Post-op Assessment: Report given to RN and Post -op Vital signs reviewed and stable  Post vital signs: Reviewed and stable  Last Vitals:  Vitals Value Taken Time  BP 123/44 08/20/2018  1:05 PM  Temp    Pulse 86 08/20/2018  1:05 PM  Resp 8 08/20/2018  1:05 PM  SpO2 100 % 08/20/2018  1:05 PM  Vitals shown include unvalidated device data.  Last Pain:  Vitals:   08/20/18 1000  TempSrc:   PainSc: 0-No pain      Patients Stated Pain Goal: 0 (12/08/79 1886)  Complications: No apparent anesthesia complications

## 2018-08-20 NOTE — Progress Notes (Signed)
Subjective:  Patient ID: [name], male    DOB: Sep 02, 1981,  MRN: [id number]  Seen in PACU. Nervous but understands plan for OR today.  Objective:   Vitals:   08/19/18 2051 08/20/18 0518  BP: 132/72 112/70  Pulse: 94 81  Resp: 16 18  Temp: 98.2 F (36.8 C) 98.4 F (36.9 C)  SpO2: 100% 100%   General AA&O x3. Normal mood and affect.  Vascular Foot warm and well perfused.  Neurologic Epicritic sensation grossly absent.  Dermatologic [place] on and funcitoning.  Orthopedic: Motor intact distally.   Results for orders placed or performed during the hospital encounter of 08/17/18 (from the past 24 hour(s))  Glucose, capillary     Status: Abnormal   Collection Time: 08/19/18 12:39 PM  Result Value Ref Range   Glucose-Capillary 205 (H) 70 - 99 mg/dL  Glucose, capillary     Status: Abnormal   Collection Time: 08/19/18  4:27 PM  Result Value Ref Range   Glucose-Capillary 287 (H) 70 - 99 mg/dL  Glucose, capillary     Status: Abnormal   Collection Time: 08/19/18  9:06 PM  Result Value Ref Range   Glucose-Capillary 377 (H) 70 - 99 mg/dL  Basic metabolic panel     Status: Abnormal   Collection Time: 08/20/18  4:59 AM  Result Value Ref Range   Sodium 134 (L) 135 - 145 mmol/L   Potassium 4.2 3.5 - 5.1 mmol/L   Chloride 96 (L) 98 - 111 mmol/L   CO2 26 22 - 32 mmol/L   Glucose, Bld 84 70 - 99 mg/dL   BUN 26 (H) 6 - 20 mg/dL   Creatinine, Ser 7.71 (H) 0.61 - 1.24 mg/dL   Calcium 8.9 8.9 - 10.3 mg/dL   GFR calc non Af Amer 8 (L) >60 mL/min   GFR calc Af Amer 9 (L) >60 mL/min   Anion gap 12 5 - 15  CBC     Status: Abnormal   Collection Time: 08/20/18  4:59 AM  Result Value Ref Range   WBC 9.9 4.0 - 10.5 K/uL   RBC 3.73 (L) 4.22 - 5.81 MIL/uL   Hemoglobin 10.8 (L) 13.0 - 17.0 g/dL   HCT 35.2 (L) 39.0 - 52.0 %   MCV 94.4 80.0 - 100.0 fL   MCH 29.0 26.0 - 34.0 pg   MCHC 30.7 30.0 - 36.0 g/dL   RDW 14.2 11.5 - 15.5 %   Platelets 238 150 - 400 K/uL   nRBC 0.0 0.0 - 0.2 %    Glucose, capillary     Status: Abnormal   Collection Time: 08/20/18  9:38 AM  Result Value Ref Range   Glucose-Capillary 128 (H) 70 - 99 mg/dL   Comment 1 Notify RN    Comment 2 Document in Chart    Results for orders placed or performed during the hospital encounter of 08/17/18  MRSA PCR Screening     Status: None   Collection Time: 08/18/18  2:28 AM  Result Value Ref Range Status   MRSA by PCR NEGATIVE NEGATIVE Final    Comment:        The GeneXpert MRSA Assay (FDA approved for NASAL specimens only), is one component of a comprehensive MRSA colonization surveillance program. It is not intended to diagnose MRSA infection nor to guide or monitor treatment for MRSA infections. Performed at [facility], [place] [address], [place], [place] [address]   Surgical pcr screen     Status: None  Collection Time: 08/18/18 10:08 AM  Result Value Ref Range Status   MRSA, PCR NEGATIVE NEGATIVE Final   Staphylococcus aureus NEGATIVE NEGATIVE Final    Comment: (NOTE) The Xpert SA Assay (FDA approved for NASAL specimens in patients 39 years of age and older), is one component of a comprehensive surveillance program. It is not intended to diagnose infection nor to guide or monitor treatment. Performed at [facility], [place] [address], [place], [place] [address]   Aerobic/Anaerobic Culture (surgical/deep wound)     Status: None (Preliminary result)   Collection Time: 08/18/18  2:00 PM  Result Value Ref Range Status   Specimen Description BONE LEFT 5TH  Final   Special Requests NONE  Final   Gram Stain   Final    RARE WBC PRESENT, PREDOMINANTLY PMN FEW GRAM NEGATIVE RODS    Culture   Final    CULTURE REINCUBATED FOR BETTER GROWTH Performed at [facility], [place] [address], [place], [place] [address]    Report Status PENDING  Incomplete  Aerobic/Anaerobic Culture (surgical/deep wound)     Status: None (Preliminary result)   Collection Time: 08/18/18  2:01 PM  Result  Value Ref Range Status   Specimen Description TISSUE LEFT 5TH  Final   Special Requests NONE  Final   Gram Stain NO WBC SEEN FEW GRAM NEGATIVE RODS   Final   Culture   Final    CULTURE REINCUBATED FOR BETTER GROWTH Performed at [facility], [place] [address] [place], [place] [address]    Report Status PENDING  Incomplete    Assessment & Plan:  Patient was evaluated and treated and all questions answered.  Diabetic foot infection L foot. -Labs reviewed. WBC normalized. -Micro pending. GNRs on GS. -Path pending. -Sugars remain wildly out of control during hospitalization. Will likely impede healing. -Continue abx. -Continue WVAC. On and functioning. Had issue with instillation, only VAC now. -NPO until after procedure -To OR today for repeat debridement, possible closure, possible metatarsal resection with soft tissue balancing if needed.  Podiatry will follow.

## 2018-08-20 NOTE — Brief Op Note (Signed)
08/20/2018  12:55 PM  PATIENT:  [name]  37 y.o. male  PRE-OPERATIVE DIAGNOSIS:  infected toe, left  POST-OPERATIVE DIAGNOSIS:  infected toe left  PROCEDURE:  Procedure(s): IRRIGATION AND DEBRIDEMENT EXTREMITY WITH SECONDARY WOUND CLOSUREAND APPLICATION OF WOUND VAC LEFT FOOT (Left) possible metatarsal resection, left (Left)  SURGEON:  Surgeon(s) and Role:    * Evelina Bucy, DPM - Primary  PHYSICIAN ASSISTANT:   ASSISTANTS: none   ANESTHESIA:   local and MAC  EBL:  10 cc   BLOOD ADMINISTERED:none  DRAINS: Wound VAC   LOCAL MEDICATIONS USED:  MARCAINE    and Amount: 20 ml  SPECIMEN:   ID Type Source Tests Collected by Time Destination  1 : Fifth metatarsal bone biopsy Tissue Other SURGICAL PATHOLOGY Evelina Bucy, DPM 08/20/2018 1248      DISPOSITION OF SPECIMEN:  PATHOLOGY  COUNTS:  YES  TOURNIQUET:  * No tourniquets in log *  DICTATION: .Note written in EPIC  PLAN OF CARE: Transfer to floor  PATIENT DISPOSITION:  PACU - hemodynamically stable.   Delay start of Pharmacological VTE agent (>24hrs) due to surgical blood loss or risk of bleeding: not applicable

## 2018-08-20 NOTE — Progress Notes (Signed)
[facility]                                                                              Patient Demographics  [name], is a 37 y.o. male, DOB - [birth year], RSW:[id number]  Admit date - 08/17/2018   Admitting Physician Janora Norlander, MD  Outpatient Primary MD for the patient is [name], Shane Rinks, MD  Outpatient specialists:   LOS - 3  days   Medical records reviewed and are as summarized below:    No chief complaint on file.      Brief summary   Patient is a 37 year old male with hypertension, ESRD on hemodialysis MWF, uncontrolled diabetes type 1, chronic diabetic left foot ulcer, was sent from podiatry office to the hospital on 11/20 for worsening of the left foot ulcer.  Patient had noted strong malodor with heavy drainage from the ulcer.  He had been using Santyl on the wound and last clinic visit or was on 11/15 and had been on clindamycin twice daily.  Assessment & Plan    Principal Problem: Left diabetic foot infection ([place]) with gangrene of the left foot -Leukocytosis with white count 17.9, status post IND of the left fifth toe and transmetatarsal with partial left fifth toe and metatarsal amputation with wound VAC on 11/21 -MRI of the left foot showed cellulitis with osteomyelitis of the fifth metatarsal head, base of fifth proximal phalanx with severe surrounding edema. -Patient seen before surgery today, underwent bone biopsy, metatarsal resection, secondary closure of surgical wound and wound VAC placement  -Wound cultures from 11/21 showing gram-negative rods, continue Zosyn.  Vancomycin discontinued on 11/22 -Podiatry following, consulted ID, d/w Dr Johnnye Sima   Active Problems:   Diabetes mellitus type 1 ([place]), noncompliant, uncontrolled, insulin-dependent, with hyperglycemia, severely noncompliant -Hemoglobin A1c 7.3 -No acute issues overnight, continue Lantus, meal coverage, sliding scale insulin -Diabetic coordinator also  following -Patient has been recommended strongly not to take his own insulin.    Diabetic gastroparesis ([name]) -Continue Zofran as needed, Reglan    HTN (hypertension) -BP stable, continue Norvasc    ESRD (end stage renal disease) ([place]) on hemodialysis -Nephrology consulted, on HD MWF, receiving HD per his schedule    Anemia of chronic disease -H&H, stable, follow closely postop  History of bipolar disorder, noncompliance -Continue Klonopin  Code Status: Full CODE STATUS DVT Prophylaxis: Placed on prophylactic DVT prophylaxis once cleared by podiatry Family Communication: Discussed in detail with the patient, all imaging results, lab results explained to the patient  Disposition Plan:   Time Spent in minutes 25 minutes  Procedures:  08/18/2018 : I&D of left fifth toe, transmetatarsal with partial left fifth toe and metatarsal amputation, bone biopsy and wound VAC  Consultants:   Podiatry  Antimicrobials:      Medications  Scheduled Meds: . amLODipine  10 mg Oral Daily  . calcitRIOL  2.25 mcg Oral Q M,W,F-HD  . [START ON 08/21/2018] Chlorhexidine Gluconate Cloth  6 each Topical Q0600  . cinacalcet  30 mg Oral Q supper  . collagenase  1 application Topical Daily  . darbepoetin (ARANESP) injection - DIALYSIS  40 mcg Intravenous Q  Fri-HD  . insulin aspart  0-5 Units Subcutaneous QHS  . insulin aspart  0-9 Units Subcutaneous TID WC  . insulin aspart  5 Units Subcutaneous TID WC  . insulin aspart  7 Units Subcutaneous Once  . insulin glargine  10 Units Subcutaneous QHS  . metoCLOPramide  5 mg Oral TID AC  . sevelamer carbonate  3,200 mg Oral BID WC   Continuous Infusions: . sodium chloride 10 mL/hr at 08/20/18 0948  . piperacillin-tazobactam (ZOSYN)  IV 3.375 g (08/19/18 2001)   PRN Meds:.acetaminophen, clonazePAM, morphine injection, ondansetron   Antibiotics   Anti-infectives (From admission, onward)   Start     Dose/Rate Route Frequency Ordered Stop    08/20/18 1120  vancomycin (VANCOCIN) powder  Status:  Discontinued       As needed 08/20/18 1121 08/20/18 1258   08/19/18 1200  vancomycin (VANCOCIN) IVPB 750 mg/150 ml premix  Status:  Discontinued     750 mg 150 mL/hr over 60 Minutes Intravenous Every M-W-F (Hemodialysis) 08/17/18 2321 08/19/18 1313   08/18/18 1350  vancomycin (VANCOCIN) powder  Status:  Discontinued       As needed 08/18/18 1410 08/18/18 1417   08/17/18 2330  vancomycin (VANCOCIN) 1,500 mg in sodium chloride 0.9 % 500 mL IVPB     1,500 mg 250 mL/hr over 120 Minutes Intravenous  Once 08/17/18 2319 08/18/18 0339   08/17/18 2245  piperacillin-tazobactam (ZOSYN) IVPB 3.375 g     3.375 g 12.5 mL/hr over 240 Minutes Intravenous Every 12 hours 08/17/18 2235          Subjective:   [name] was seen and examined today.  Seen before the surgery, denied any complaints.   Denies dizziness, chest pain, shortness of breath, abdominal pain.  Pain in the left foot controlled  Objective:   Vitals:   08/20/18 1300 08/20/18 1301 08/20/18 1315 08/20/18 1330  BP:  (!) 123/44 (!) 134/53 129/74  Pulse:  84 88 86  Resp:  13 19 17   Temp: 97.9 F (36.6 C) 97.9 F (36.6 C)    TempSrc:      SpO2:  100% 100% 99%  Weight:      Height:        Intake/Output Summary (Last 24 hours) at 08/20/2018 1355 Last data filed at 08/20/2018 1305 Gross per 24 hour  Intake 1790 ml  Output 50 ml  Net 1740 ml     Wt Readings from Last 3 Encounters:  08/19/18 73.7 kg  05/26/18 83.9 kg  05/25/18 83.9 kg   Physical Exam  General: Alert and oriented x 3, NAD  Eyes:   HEENT:  Cardiovascular: S1 S2 auscultated, RRR, no pedal edema b/l  Respiratory: CTA B  Gastrointestinal: Soft, nontender, nondistended, + bowel sounds  Ext: no pedal edema bilaterally  Neuro: no new deficits  Musculoskeletal: No digital cyanosis, clubbing  Skin: Left foot dressing intact with wound VAC  Psych: Normal affect and demeanor, alert and oriented  x3      Data Reviewed:  I have personally reviewed following labs and imaging studies  Micro Results Recent Results (from the past 240 hour(s))  MRSA PCR Screening     Status: None   Collection Time: 08/18/18  2:28 AM  Result Value Ref Range Status   MRSA by PCR NEGATIVE NEGATIVE Final    Comment:        The GeneXpert MRSA Assay (FDA approved for NASAL specimens only), is one component of a comprehensive MRSA colonization surveillance  program. It is not intended to diagnose MRSA infection nor to guide or monitor treatment for MRSA infections. Performed at [facility], [place] [address], [place], [place] [address]   Surgical pcr screen     Status: None   Collection Time: 08/18/18 10:08 AM  Result Value Ref Range Status   MRSA, PCR NEGATIVE NEGATIVE Final   Staphylococcus aureus NEGATIVE NEGATIVE Final    Comment: (NOTE) The Xpert SA Assay (FDA approved for NASAL specimens in patients 7 years of age and older), is one component of a comprehensive surveillance program. It is not intended to diagnose infection nor to guide or monitor treatment. Performed at [facility], [place] [address], [place], [place] [address]   Aerobic/Anaerobic Culture (surgical/deep wound)     Status: None (Preliminary result)   Collection Time: 08/18/18  2:00 PM  Result Value Ref Range Status   Specimen Description BONE LEFT 5TH  Final   Special Requests NONE  Final   Gram Stain   Final    RARE WBC PRESENT, PREDOMINANTLY PMN FEW GRAM NEGATIVE RODS    Culture   Final    CULTURE REINCUBATED FOR BETTER GROWTH Performed at [facility], [place] [address], [place], [place] [address]    Report Status PENDING  Incomplete  Aerobic/Anaerobic Culture (surgical/deep wound)     Status: None (Preliminary result)   Collection Time: 08/18/18  2:01 PM  Result Value Ref Range Status   Specimen Description TISSUE LEFT 5TH  Final   Special Requests NONE  Final   Gram Stain NO WBC SEEN FEW GRAM  NEGATIVE RODS   Final   Culture   Final    CULTURE REINCUBATED FOR BETTER GROWTH Performed at [facility], [place] [address] [place], [place] [address]    Report Status PENDING  Incomplete    Radiology Reports Mr Foot Left Wo Contrast  Result Date: 08/18/2018 CLINICAL DATA:  Diabetic foot ulcer along the lateral forefoot. EXAM: MRI OF THE LEFT FOOT WITHOUT CONTRAST TECHNIQUE: Multiplanar, multisequence MR imaging of the left was performed. No intravenous contrast was administered. COMPARISON:  None. FINDINGS: Bones/Joint/Cartilage Soft tissue wound along the plantar lateral aspect of the fifth MTP joint extending to the cortex. Cortical destruction with severe bone marrow edema in the base of the fifth proximal phalanx and fifth metatarsal head most consistent with osteomyelitis. Mild marrow edema in the fifth middle phalanx which may be reactive versus secondary to osteomyelitis. No other marrow signal abnormality. No fracture or dislocation. Normal alignment. No joint effusion. Ligaments Lisfranc ligament is intact.  Collateral ligaments are intact. Muscles and Tendons Flexor, peroneal and extensor compartment tendons are intact. Mild muscle atrophy. Soft tissue No fluid collection or hematoma. No soft tissue mass. Soft tissue edema in the lateral forefoot extending into the fifth digit most consistent with cellulitis. Small focus of low signal in the soft tissues along the medial aspect of the fifth digit likely reflecting a small amount of air. IMPRESSION: 1. Soft tissue wound along the plantar lateral aspect of the fifth MTP joint with surrounding cellulitis. Osteomyelitis of the fifth metatarsal head and base of the fifth proximal phalanx with severe surrounding marrow edema. Electronically Signed   By: Kathreen Devoid   On: 08/18/2018 10:08   Dg Foot 2 Views Left  Result Date: 08/19/2018 CLINICAL DATA:  S/P IRRIGATION AND DEBRIDEMENT OF LEFT 5TH TOE AND TRANSMETATARSAL, WITH PARTICAL LEFT  5TH TOE AND METATARSAL AMPUTATION, BONE BIOPSY, WOUND VAC  APPLICATION FROM YESTERDAY. EXAM: LEFT FOOT - 2 VIEW COMPARISON:  Plain film of the LEFT foot dated 08/11/2018. MRI of the LEFT foot dated 08/18/2018 FINDINGS: Surgical changes of fifth metatarsal amputation, with metatarsal base remaining. Expected postsurgical changes within the overlying soft tissues. No abnormal soft tissue air. Remaining osseous structures appear intact and normal in mineralization. IMPRESSION: Status post fifth metatarsal amputation. Expected postsurgical changes within the overlying soft tissues. No evidence of surgical complicating feature. Electronically Signed   By: Franki Cabot M.D.   On: 08/19/2018 19:34   Dg Foot Complete Left  Result Date: 08/11/2018 Please see detailed radiograph report in office note.  Dg Foot Complete Left  Result Date: 08/04/2018 Please see detailed radiograph report in office note.   Lab Data:  CBC: Recent Labs  Lab 08/17/18 2227 08/18/18 0719 08/19/18 0534 08/20/18 0459  WBC 15.3* 14.0* 17.9* 9.9  HGB 8.4* 9.3* 8.3* 10.8*  HCT 27.3* 29.8* 26.4* 35.2*  MCV 92.9 92.5 93.0 94.4  PLT 221 261 237 875   Basic Metabolic Panel: Recent Labs  Lab 08/17/18 2227 08/18/18 0719 08/19/18 0534 08/20/18 0459  NA 133* 131* 129* 134*  K 4.2 4.7 5.4* 4.2  CL 94* 92* 91* 96*  CO2 26 25 25 26   GLUCOSE 107* 188* 289* 84  BUN 44* 47* 58* 26*  CREATININE 9.86* 10.45* 12.34* 7.71*  CALCIUM 8.5* 8.8* 8.6* 8.9  PHOS  --  4.9*  --   --    GFR: Estimated Creatinine Clearance: 11.7 mL/min (A) (by C-G formula based on SCr of 7.71 mg/dL (H)). Liver Function Tests: Recent Labs  Lab 08/18/18 0719  ALBUMIN 2.6*   No results for input(s): LIPASE, AMYLASE in the last 168 hours. No results for input(s): AMMONIA in the last 168 hours. Coagulation Profile: No results for input(s): INR, PROTIME in the last 168 hours. Cardiac Enzymes: No results for input(s): CKTOTAL, CKMB, CKMBINDEX,  TROPONINI in the last 168 hours. BNP (last 3 results) No results for input(s): PROBNP in the last 8760 hours. HbA1C: Recent Labs    08/18/18 0719  HGBA1C 7.3*   CBG: Recent Labs  Lab 08/19/18 1627 08/19/18 2106 08/20/18 0756 08/20/18 0938 08/20/18 1314  GLUCAP 287* 377* 75 128* 149*   Lipid Profile: No results for input(s): CHOL, HDL, LDLCALC, TRIG, CHOLHDL, LDLDIRECT in the last 72 hours. Thyroid Function Tests: No results for input(s): TSH, T4TOTAL, FREET4, T3FREE, THYROIDAB in the last 72 hours. Anemia Panel: No results for input(s): VITAMINB12, FOLATE, FERRITIN, TIBC, IRON, RETICCTPCT in the last 72 hours. Urine analysis:    Component Value Date/Time   COLORURINE YELLOW 05/26/2015 1115   APPEARANCEUR CLEAR 05/26/2015 1115   APPEARANCEUR Hazy 07/30/2014 2303   LABSPEC 1.016 05/26/2015 1115   LABSPEC 1.012 07/30/2014 2303   PHURINE 8.0 05/26/2015 1115   GLUCOSEU 500 (A) 05/26/2015 1115   GLUCOSEU >=500 07/30/2014 2303   HGBUR MODERATE (A) 05/26/2015 1115   BILIRUBINUR NEGATIVE 05/26/2015 1115   BILIRUBINUR Negative 07/30/2014 2303   KETONESUR 15 (A) 05/26/2015 1115   PROTEINUR >300 (A) 05/26/2015 1115   UROBILINOGEN 0.2 05/26/2015 1115   NITRITE NEGATIVE 05/26/2015 1115   LEUKOCYTESUR NEGATIVE 05/26/2015 1115   LEUKOCYTESUR Negative 07/30/2014 2303     Shahad Mazurek M.D. [facility] 08/20/2018, 1:55 PM  Pager: [phone] Between 7am to 7pm - call Pager - [phone]  After 7pm go to [email or web address] - password [email or web address]  Call night coverage person covering after 7pm

## 2018-08-20 NOTE — Progress Notes (Signed)
Pharmacy Antibiotic Note  [name] is a 37 y.o. male admitted on 08/17/2018 with diabetic foot infection.  Pharmacy has been consulted for Zosyn dosing. Direct admit [facility].  Pt has ESRD on HD MWF. S/p L foot I&D, wound closure Tissue from OR, GNR. Asked Md,  Vancomycin discontinued.  t ID consult.  ID said cont zosyn , await Cx Afeb , wbc wnl  11/21: Soft tissue culture: GNR 11/21 Bone tissue culture: GNR 11/21 MRSA PCR Negative   Plan: Continue Zosyn 3.375G IV q12h to be infused over 4 hours Trend WBC, temp F/u cultures.    Temp (24hrs), Avg:98.1 F (36.7 C), Min:97.9 F (36.6 C), Max:98.6 F (37 C)  No Known Allergies   Thank you for allowing pharmacy to be part of this patients care team. Nicole Cella, RPh Clinical Pharmacist Pager: [phone] Please check AMION for all [place] phone numbers After 10:00 PM, call [place] [phone] 08/20/2018 10:00 PM

## 2018-08-20 NOTE — Op Note (Signed)
Patient Name: DONTEL HARSHBERGER DOB: April 12, 1981  MRN: [id number]   Date of Service: 08/20/18    Surgeon: Dr. Hardie Pulley, DPM Assistants: None Pre-operative Diagnosis: Diabetic Foot Infection Left Foot, Osteomyelitis, planned delayed closure Post-operative Diagnosis: same Procedures:             1) Secondary closure of surgical wound  2) Metatarsal resection  3) Bone biopsy  4) Application of Wound VAC Pathology/Specimens: ID Type Source Tests Collected by Time Destination  1 : Fifth metatarsal bone biopsy Tissue Other SURGICAL PATHOLOGY Evelina Bucy, DPM 08/20/2018 1248    Anesthesia: MAC/Local Hemostasis: Anatomic Estimated Blood Loss: 10 ml Materials: None Medications: 1g Vancomycin powder, Xcelerate collagen powder Complications: None  Indications for Procedure:  This is a 37 y.o. male who returns to the [facility] today for planned repeat debridement, wound closure subsquent to surgery 08/18/18. His WBC has normalized and the wound appeared healthy today and able to be closed.   Procedure in Detail: Patient was identified in pre-operative holding area. Formal consent was signed and the left lower extremity was marked. Patient was brought back to the operating room and placed on the operating room table in the supine position. Anesthesia was induced.   The extremity was prepped and draped in the usual sterile fashion. Timeout was taken to confirm patient name, laterality, and procedure prior to incision. Attention was then directed to the left foot.  There was no noted purulence today the wound appeared rather healthy but was still with some areas of nonviable tissue.  All nonviable tissue was sharply excisionally debrided with a rondure.  The wound was thoroughly pulse lavaged with 3 L of normal saline in order to prep the wound for closure.  Post pulse lavage the wound did appear quite healthy with good bleeding viable tissue.  Attention was then directed to the metatarsal base.  The  metatarsal did appear healthy however due to the wound margins not be able to be approximated the metatarsal was resected with a sagittal saw.  The base was left intact to prevent cavovarus deformity.  The remaining but did appear healthy and thus not indicated for complete removal. The area was then copious irrigated and a rongeur was used to biopsy the bone to assess for possible clean margin.  The wound was then secondarily closed with 2-0 nylon and skin staples.  The extent of the wound compromised complete closure; only some areas of the wound were able to be reapproximated and there was a small area that would have to heal in with the assistance of a wound VAC.  A Xeroform was applied to the posterior of the wound.  A black sponge for wound VAC was applied to the wound base followed by adherent dressing.  The wound VAC was set to a Praveena wound VAC machine at 125 mmHg.  The foot was then dressed with cast padding and Ace bandage.  The wound VAC machine did show signs of possible leak and the dressing was reinforced.  The foot was then redressed with cast padding and Ace bandage.   Disposition: Following a period of post-operative monitoring, patient will be transferred back to the floor.  Should the wound VAC continue to leak we will plan to hook it up to wall suction and have the wound VAC changed tomorrow.

## 2018-08-20 NOTE — Progress Notes (Addendum)
[facility] Progress Note   Subjective:  S/p L foot I&D, wound closure Seen in room alert,  pain controlled.   Objective Vitals:   08/20/18 1301 08/20/18 1315 08/20/18 1330 08/20/18 1357  BP: (!) 123/44 (!) 134/53 129/74 (!) 154/57  Pulse: 84 88 86 81  Resp: 13 19 17 18   Temp: 97.9 F (36.6 C)  97.9 F (36.6 C)   TempSrc:      SpO2: 100% 100% 99% (!) 83%  Weight:      Height:       Physical Exam General: WNWD male NAD  Heart: RRR no murmur Lungs: CTAB  Abdomen: soft NT, ND Extremities: L foot bandaged +woundVAC Dialysis Access: LUE AVG+bruit   Dialysis: [facility] 4h   2/2 bath 75.5kg   Hep 5000   LUA AVG Calcitriol 2.63mcg Ipo/HD No ESA Last HGB 11.7 06/29/18 (last mircera 50 on 08/03/18)  Assessment/Plan: 1. ESRD -HD MWF schedule. Next HD Sun 11/24 d/t holiday scheduling this week.  2. 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 on vanc/ zosyn.  S/p 5th toe amp and wound vac 11/21,Back to OR today for I&D, bone biopsy 3. Hypertension/volume BP/volume stable / OP meds Clonidine patch 0.3, Amlodipine 10 mg , Losartan 50 mg q day.  4. Anemia - hgb 10.8  Aranesp 40 q Friday  5. Metabolic bone disease -Continue Calcitriol/Renvela binder  6. DM type 1- per admit   Lynnda Child PA-C [facility] Pager [phone] 08/20/2018,2:56 PM  LOS: 3 days   Pt seen, examined and agree w A/P as above.  Kelly Splinter MD [name] pager [phone]   08/20/2018, 3:42 PM    Additional Objective Labs: Basic Metabolic Panel: Recent Labs  Lab 08/18/18 0719 08/19/18 0534 08/20/18 0459  NA 131* 129* 134*  K 4.7 5.4* 4.2  CL 92* 91* 96*  CO2 25 25 26   GLUCOSE 188* 289* 84  BUN 47* 58* 26*  CREATININE 10.45* 12.34* 7.71*  CALCIUM 8.8* 8.6* 8.9  PHOS 4.9*  --   --    CBC: Recent Labs  Lab 08/17/18 2227 08/18/18 0719 08/19/18 0534 08/20/18 0459  WBC 15.3* 14.0* 17.9* 9.9  HGB 8.4*  9.3* 8.3* 10.8*  HCT 27.3* 29.8* 26.4* 35.2*  MCV 92.9 92.5 93.0 94.4  PLT 221 261 237 238   Blood Culture    Component Value Date/Time   SDES TISSUE LEFT 5TH 08/18/2018 1401   SPECREQUEST NONE 08/18/2018 1401   CULT  08/18/2018 1401    HOLDING FOR POSSIBLE ANAEROBE Performed at [facility], [place] [address] [place], [place] [address]    REPTSTATUS PENDING 08/18/2018 1401    Cardiac Enzymes: No results for input(s): CKTOTAL, CKMB, CKMBINDEX, TROPONINI in the last 168 hours. CBG: Recent Labs  Lab 08/19/18 1627 08/19/18 2106 08/20/18 0756 08/20/18 0938 08/20/18 1314  GLUCAP 287* 377* 75 128* 149*   Iron Studies: No results for input(s): IRON, TIBC, TRANSFERRIN, FERRITIN in the last 72 hours. Lab Results  Component Value Date   INR 1.00 02/05/2018   INR 1.16 10/25/2017   Medications: . sodium chloride 10 mL/hr at 08/20/18 0948  . piperacillin-tazobactam (ZOSYN)  IV 3.375 g (08/19/18 2001)   . amLODipine  10 mg Oral Daily  . calcitRIOL  2.25 mcg Oral Q M,W,F-HD  . [START ON 08/21/2018] Chlorhexidine Gluconate Cloth  6 each Topical Q0600  . cinacalcet  30 mg Oral Q supper  . collagenase  1 application Topical  Daily  . darbepoetin (ARANESP) injection - DIALYSIS  40 mcg Intravenous Q Fri-HD  . insulin aspart  0-5 Units Subcutaneous QHS  . insulin aspart  0-9 Units Subcutaneous TID WC  . insulin aspart  5 Units Subcutaneous TID WC  . insulin aspart  7 Units Subcutaneous Once  . insulin glargine  10 Units Subcutaneous QHS  . metoCLOPramide  5 mg Oral TID AC  . sevelamer carbonate  3,200 mg Oral BID WC

## 2018-08-20 NOTE — Consult Note (Signed)
[facility]    Date of Admission:  08/17/2018   Total days of antibiotics: 3 zosyn               Reason for Consult: diabetic foot ulcer    Referring Provider: Rai   Assessment: Diabetic Foot Ulcer with gangrene DM2 CKD  Plan: 1. Continue zosyn  2. Await Cx  Hopefully his bacteria will be sensitive to something he can get at HD and we avoid PIC.  His mood ranges from amiable and offering me money (which I refused) to using profanity. He became upset when I asked "how are you?"  Thank you so much for this interesting consult,  Principal Problem:   Diabetic foot infection ([place]) Active Problems:   Diabetes mellitus type 1 ([place])   Diabetic gastroparesis ([place])   HTN (hypertension)   Cellulitis and abscess of foot   ESRD (end stage renal disease) ([place])   Anemia of chronic disease   Acute osteomyelitis of left ankle or foot ([place])   Osteomyelitis of fifth toe of left foot ([place])   Diabetic ulcer of left midfoot associated with diabetes mellitus due to underlying condition, with necrosis of bone ([place])   Encounter for planned post-operative wound closure   Osteomyelitis of foot, left, acute ([place])   . amLODipine  10 mg Oral Daily  . calcitRIOL  2.25 mcg Oral Q M,W,F-HD  . [START ON 08/21/2018] Chlorhexidine Gluconate Cloth  6 each Topical Q0600  . cinacalcet  30 mg Oral Q supper  . collagenase  1 application Topical Daily  . darbepoetin (ARANESP) injection - DIALYSIS  40 mcg Intravenous Q Fri-HD  . insulin aspart  0-5 Units Subcutaneous QHS  . insulin aspart  0-9 Units Subcutaneous TID WC  . insulin aspart  5 Units Subcutaneous TID WC  . insulin aspart  7 Units Subcutaneous Once  . insulin glargine  10 Units Subcutaneous QHS  . metoCLOPramide  5 mg Oral TID AC  . sevelamer carbonate  3,200 mg Oral BID WC    HPI: [name] is a 37 y.o. male with hx of DM1 and chronic L foot ulcer present for ? Period (he does not remember).  He  noted strong odor and d/c from wound and was sent to hospital on 11-20. His WBC was 15.3 and he was afebrile.  He underwent I & D and multiple L foot bone Bx on 11-21 as well as resection of L 5th toe and L 5th metatarsal. He had secondary wound closure and VAC today.   His Cx are showing few GNR, possible anaerobe.   Review of Systems: Review of Systems  Constitutional: Negative for chills and fever.  Respiratory: Negative for shortness of breath.   Gastrointestinal: Negative for constipation and diarrhea.  Genitourinary: Negative for dysuria.  Please see HPI. All other systems reviewed and negative.   Past Medical History:  Diagnosis Date  . Diabetes mellitus without complication ([place])   . Diabetic gastroparesis ([place])   . Dialysis patient ([place])   . Hypertension   . Renal disorder    Dialysis  . Sepsis [facility])     Social History   Tobacco Use  . Smoking status: Never Smoker  . Smokeless tobacco: Never Used  Substance Use Topics  . Alcohol use: No  . Drug use: No    Family History  Problem Relation Age of Onset  . Diabetes Mellitus II Unknown  Medications:  Scheduled: . amLODipine  10 mg Oral Daily  . calcitRIOL  2.25 mcg Oral Q M,W,F-HD  . [START ON 08/21/2018] Chlorhexidine Gluconate Cloth  6 each Topical Q0600  . cinacalcet  30 mg Oral Q supper  . collagenase  1 application Topical Daily  . darbepoetin (ARANESP) injection - DIALYSIS  40 mcg Intravenous Q Fri-HD  . insulin aspart  0-5 Units Subcutaneous QHS  . insulin aspart  0-9 Units Subcutaneous TID WC  . insulin aspart  5 Units Subcutaneous TID WC  . insulin aspart  7 Units Subcutaneous Once  . insulin glargine  10 Units Subcutaneous QHS  . metoCLOPramide  5 mg Oral TID AC  . sevelamer carbonate  3,200 mg Oral BID WC    Abtx:  Anti-infectives (From admission, onward)   Start     Dose/Rate Route Frequency Ordered Stop   08/20/18 1120  vancomycin (VANCOCIN) powder  Status:  Discontinued       As  needed 08/20/18 1121 08/20/18 1258   08/19/18 1200  vancomycin (VANCOCIN) IVPB 750 mg/150 ml premix  Status:  Discontinued     750 mg 150 mL/hr over 60 Minutes Intravenous Every M-W-F (Hemodialysis) 08/17/18 2321 08/19/18 1313   08/18/18 1350  vancomycin (VANCOCIN) powder  Status:  Discontinued       As needed 08/18/18 1410 08/18/18 1417   08/17/18 2330  vancomycin (VANCOCIN) 1,500 mg in sodium chloride 0.9 % 500 mL IVPB     1,500 mg 250 mL/hr over 120 Minutes Intravenous  Once 08/17/18 2319 08/18/18 0339   08/17/18 2245  piperacillin-tazobactam (ZOSYN) IVPB 3.375 g     3.375 g 12.5 mL/hr over 240 Minutes Intravenous Every 12 hours 08/17/18 2235          OBJECTIVE: Blood pressure (!) 154/57, pulse 81, temperature 97.9 F (36.6 C), resp. rate 18, height _0  (1.676 m), weight 73.7 kg, SpO2 (!) 83 %.  Physical Exam  Constitutional: He appears well-developed and well-nourished.  HENT:  Mouth/Throat: No oropharyngeal exudate.  Eyes: Pupils are equal, round, and reactive to light. EOM are normal.  Cardiovascular: Normal rate, regular rhythm and normal heart sounds.  Pulmonary/Chest: Effort normal and breath sounds normal.  Abdominal: Soft. Bowel sounds are normal. He exhibits no distension. There is no tenderness.  Musculoskeletal:       Arms:      Feet:  Psychiatric: His affect is labile.    Lab Results Results for orders placed or performed during the hospital encounter of 08/17/18 (from the past 48 hour(s))  Glucose, capillary     Status: Abnormal   Collection Time: 08/18/18  3:59 PM  Result Value Ref Range   Glucose-Capillary 146 (H) 70 - 99 mg/dL  Glucose, capillary     Status: Abnormal   Collection Time: 08/18/18  5:06 PM  Result Value Ref Range   Glucose-Capillary 156 (H) 70 - 99 mg/dL  Glucose, capillary     Status: Abnormal   Collection Time: 08/18/18  8:49 PM  Result Value Ref Range   Glucose-Capillary 362 (H) 70 - 99 mg/dL  Glucose, capillary     Status:  Abnormal   Collection Time: 08/18/18 11:30 PM  Result Value Ref Range   Glucose-Capillary 476 (H) 70 - 99 mg/dL  Glucose, capillary     Status: Abnormal   Collection Time: 08/19/18 12:35 AM  Result Value Ref Range   Glucose-Capillary 407 (H) 70 - 99 mg/dL  Glucose, capillary     Status: Abnormal  Collection Time: 08/19/18  2:11 AM  Result Value Ref Range   Glucose-Capillary 344 (H) 70 - 99 mg/dL  Glucose, capillary     Status: Abnormal   Collection Time: 08/19/18  5:22 AM  Result Value Ref Range   Glucose-Capillary 304 (H) 70 - 99 mg/dL  CBC     Status: Abnormal   Collection Time: 08/19/18  5:34 AM  Result Value Ref Range   WBC 17.9 (H) 4.0 - 10.5 K/uL   RBC 2.84 (L) 4.22 - 5.81 MIL/uL   Hemoglobin 8.3 (L) 13.0 - 17.0 g/dL   HCT 26.4 (L) 39.0 - 52.0 %   MCV 93.0 80.0 - 100.0 fL   MCH 29.2 26.0 - 34.0 pg   MCHC 31.4 30.0 - 36.0 g/dL   RDW 14.1 11.5 - 15.5 %   Platelets 237 150 - 400 K/uL   nRBC 0.0 0.0 - 0.2 %    Comment: Performed at [facility], [place]. [address], [place], [place] [address]  Basic metabolic panel     Status: Abnormal   Collection Time: 08/19/18  5:34 AM  Result Value Ref Range   Sodium 129 (L) 135 - 145 mmol/L   Potassium 5.4 (H) 3.5 - 5.1 mmol/L   Chloride 91 (L) 98 - 111 mmol/L   CO2 25 22 - 32 mmol/L   Glucose, Bld 289 (H) 70 - 99 mg/dL   BUN 58 (H) 6 - 20 mg/dL   Creatinine, Ser 12.34 (H) 0.61 - 1.24 mg/dL   Calcium 8.6 (L) 8.9 - 10.3 mg/dL   GFR calc non Af Amer 4 (L) >60 mL/min   GFR calc Af Amer 5 (L) >60 mL/min    Comment: (NOTE) The eGFR has been calculated using the CKD EPI equation. This calculation has not been validated in all clinical situations. eGFR's persistently <60 mL/min signify possible Chronic Kidney Disease.    Anion gap 13 5 - 15    Comment: Performed at [place] [address], [place], [place] [address]  Glucose, capillary     Status: Abnormal   Collection Time: 08/19/18  9:18 AM  Result Value Ref Range     Glucose-Capillary 197 (H) 70 - 99 mg/dL  Glucose, capillary     Status: Abnormal   Collection Time: 08/19/18 12:39 PM  Result Value Ref Range   Glucose-Capillary 205 (H) 70 - 99 mg/dL  Glucose, capillary     Status: Abnormal   Collection Time: 08/19/18  4:27 PM  Result Value Ref Range   Glucose-Capillary 287 (H) 70 - 99 mg/dL  Glucose, capillary     Status: Abnormal   Collection Time: 08/19/18  9:06 PM  Result Value Ref Range   Glucose-Capillary 377 (H) 70 - 99 mg/dL  Basic metabolic panel     Status: Abnormal   Collection Time: 08/20/18  4:59 AM  Result Value Ref Range   Sodium 134 (L) 135 - 145 mmol/L   Potassium 4.2 3.5 - 5.1 mmol/L   Chloride 96 (L) 98 - 111 mmol/L   CO2 26 22 - 32 mmol/L   Glucose, Bld 84 70 - 99 mg/dL   BUN 26 (H) 6 - 20 mg/dL   Creatinine, Ser 7.71 (H) 0.61 - 1.24 mg/dL    Comment: DELTA CHECK NOTED   Calcium 8.9 8.9 - 10.3 mg/dL   GFR calc non Af Amer 8 (L) >60 mL/min   GFR calc Af Amer 9 (L) >60 mL/min    Comment: (  NOTE) The eGFR has been calculated using the CKD EPI equation. This calculation has not been validated in all clinical situations. eGFR's persistently <60 mL/min signify possible Chronic Kidney Disease.    Anion gap 12 5 - 15    Comment: Performed at [facility] [address] [place], [place] [address]  CBC     Status: Abnormal   Collection Time: 08/20/18  4:59 AM  Result Value Ref Range   WBC 9.9 4.0 - 10.5 K/uL   RBC 3.73 (L) 4.22 - 5.81 MIL/uL   Hemoglobin 10.8 (L) 13.0 - 17.0 g/dL    Comment: REPEATED TO VERIFY DELTA CHECK NOTED    HCT 35.2 (L) 39.0 - 52.0 %   MCV 94.4 80.0 - 100.0 fL   MCH 29.0 26.0 - 34.0 pg   MCHC 30.7 30.0 - 36.0 g/dL   RDW 14.2 11.5 - 15.5 %   Platelets 238 150 - 400 K/uL   nRBC 0.0 0.0 - 0.2 %    Comment: Performed at [facility], [place] [address], [place], [place] [address]  Glucose, capillary     Status: None   Collection Time: 08/20/18  7:56 AM  Result Value Ref Range    Glucose-Capillary 75 70 - 99 mg/dL  Glucose, capillary     Status: Abnormal   Collection Time: 08/20/18  9:38 AM  Result Value Ref Range   Glucose-Capillary 128 (H) 70 - 99 mg/dL   Comment 1 Notify RN    Comment 2 Document in Chart   Glucose, capillary     Status: Abnormal   Collection Time: 08/20/18  1:14 PM  Result Value Ref Range   Glucose-Capillary 149 (H) 70 - 99 mg/dL      Component Value Date/Time   SDES TISSUE LEFT 5TH 08/18/2018 1401   SPECREQUEST NONE 08/18/2018 1401   CULT  08/18/2018 1401    HOLDING FOR POSSIBLE ANAEROBE Performed at [facility], [place] [address], [place], [place] [address]    REPTSTATUS PENDING 08/18/2018 1401   Dg Foot 2 Views Left  Result Date: 08/19/2018 CLINICAL DATA:  S/P IRRIGATION AND DEBRIDEMENT OF LEFT 5TH TOE AND TRANSMETATARSAL, WITH PARTICAL LEFT 5TH TOE AND METATARSAL AMPUTATION, BONE BIOPSY, WOUND VAC APPLICATION FROM YESTERDAY. EXAM: LEFT FOOT - 2 VIEW COMPARISON:  Plain film of the LEFT foot dated 08/11/2018. MRI of the LEFT foot dated 08/18/2018 FINDINGS: Surgical changes of fifth metatarsal amputation, with metatarsal base remaining. Expected postsurgical changes within the overlying soft tissues. No abnormal soft tissue air. Remaining osseous structures appear intact and normal in mineralization. IMPRESSION: Status post fifth metatarsal amputation. Expected postsurgical changes within the overlying soft tissues. No evidence of surgical complicating feature. Electronically Signed   By: Franki Cabot M.D.   On: 08/19/2018 19:34   Recent Results (from the past 240 hour(s))  MRSA PCR Screening     Status: None   Collection Time: 08/18/18  2:28 AM  Result Value Ref Range Status   MRSA by PCR NEGATIVE NEGATIVE Final    Comment:        The GeneXpert MRSA Assay (FDA approved for NASAL specimens only), is one component of a comprehensive MRSA colonization surveillance program. It is not intended to diagnose MRSA infection nor to  guide or monitor treatment for MRSA infections. Performed at [facility], [place] [address], [place],  [address]   Surgical pcr screen     Status: None   Collection Time: 08/18/18 10:08  AM  Result Value Ref Range Status   MRSA, PCR NEGATIVE NEGATIVE Final   Staphylococcus aureus NEGATIVE NEGATIVE Final    Comment: (NOTE) The Xpert SA Assay (FDA approved for NASAL specimens in patients 48 years of age and older), is one component of a comprehensive surveillance program. It is not intended to diagnose infection nor to guide or monitor treatment. Performed at [facility], [place] [address], [place], [place] [address]   Aerobic/Anaerobic Culture (surgical/deep wound)     Status: None (Preliminary result)   Collection Time: 08/18/18  2:00 PM  Result Value Ref Range Status   Specimen Description BONE LEFT 5TH  Final   Special Requests NONE  Final   Gram Stain   Final    RARE WBC PRESENT, PREDOMINANTLY PMN FEW GRAM NEGATIVE RODS    Culture   Final    HOLDING FOR POSSIBLE ANAEROBE Performed at [facility], [place] [address], [place], [place] [address]    Report Status PENDING  Incomplete  Aerobic/Anaerobic Culture (surgical/deep wound)     Status: None (Preliminary result)   Collection Time: 08/18/18  2:01 PM  Result Value Ref Range Status   Specimen Description TISSUE LEFT 5TH  Final   Special Requests NONE  Final   Gram Stain NO WBC SEEN FEW GRAM NEGATIVE RODS   Final   Culture   Final    HOLDING FOR POSSIBLE ANAEROBE Performed at [facility], [place] [address] [place], [place] [address]    Report Status PENDING  Incomplete    Microbiology: Recent Results (from the past 240 hour(s))  MRSA PCR Screening     Status: None   Collection Time: 08/18/18  2:28 AM  Result Value Ref Range Status   MRSA by PCR NEGATIVE NEGATIVE Final    Comment:        The GeneXpert MRSA Assay (FDA approved for NASAL specimens only), is one component of a comprehensive  MRSA colonization surveillance program. It is not intended to diagnose MRSA infection nor to guide or monitor treatment for MRSA infections. Performed at [facility], [place] [address], [place], [place] [address]   Surgical pcr screen     Status: None   Collection Time: 08/18/18 10:08 AM  Result Value Ref Range Status   MRSA, PCR NEGATIVE NEGATIVE Final   Staphylococcus aureus NEGATIVE NEGATIVE Final    Comment: (NOTE) The Xpert SA Assay (FDA approved for NASAL specimens in patients 74 years of age and older), is one component of a comprehensive surveillance program. It is not intended to diagnose infection nor to guide or monitor treatment. Performed at [facility], [place] [address], [place], [place] [address]   Aerobic/Anaerobic Culture (surgical/deep wound)     Status: None (Preliminary result)   Collection Time: 08/18/18  2:00 PM  Result Value Ref Range Status   Specimen Description BONE LEFT 5TH  Final   Special Requests NONE  Final   Gram Stain   Final    RARE WBC PRESENT, PREDOMINANTLY PMN FEW GRAM NEGATIVE RODS    Culture   Final    HOLDING FOR POSSIBLE ANAEROBE Performed at [facility], [place] [address], [place], [place] [address]    Report Status PENDING  Incomplete  Aerobic/Anaerobic Culture (surgical/deep wound)     Status: None (Preliminary result)   Collection Time: 08/18/18  2:01 PM  Result Value Ref Range Status   Specimen Description TISSUE LEFT 5TH  Final  Special Requests NONE  Final   Gram Stain NO WBC SEEN FEW GRAM NEGATIVE RODS   Final   Culture   Final    HOLDING FOR POSSIBLE ANAEROBE Performed at [facility], [place] [address], [place], [place] [address]    Report Status PENDING  Incomplete    Radiographs and labs were personally reviewed by me.   Bobby Rumpf, MD [facility] [place] [facility] [phone] 08/20/2018, 3:09 PM

## 2018-08-20 NOTE — Anesthesia Postprocedure Evaluation (Signed)
Anesthesia Post Note  Patient: [name]  Procedure(s) Performed: IRRIGATION AND DEBRIDEMENT EXTREMITY WITH SECONDARY WOUND CLOSUREAND APPLICATION OF WOUND VAC LEFT FOOT (Left Foot) FIFTH METATARSAL BONE BIOPSY (Left Foot)     Patient location during evaluation: PACU Anesthesia Type: General Level of consciousness: awake and alert, awake and oriented Pain management: pain level controlled Vital Signs Assessment: post-procedure vital signs reviewed and stable Respiratory status: spontaneous breathing, nonlabored ventilation and respiratory function stable Cardiovascular status: blood pressure returned to baseline and stable Postop Assessment: no apparent nausea or vomiting Anesthetic complications: no    Last Vitals:  Vitals:   08/20/18 1330 08/20/18 1357  BP: 129/74 (!) 154/57  Pulse: 86 81  Resp: 17 18  Temp: 36.6 C   SpO2: 99% (!) 83%    Last Pain:  Vitals:   08/20/18 1330  TempSrc:   PainSc: 0-No pain                 Catalina Gravel

## 2018-08-20 NOTE — Anesthesia Preprocedure Evaluation (Signed)
Anesthesia Evaluation  Patient identified by MRN, date of birth, ID band Patient awake    Reviewed: Allergy & Precautions, NPO status , Patient's Chart, lab work & pertinent test results  Airway Mallampati: II  TM Distance: >3 FB Neck ROM: Full    Dental  (+) Teeth Intact, Dental Advisory Given   Pulmonary neg pulmonary ROS,    Pulmonary exam normal breath sounds clear to auscultation       Cardiovascular hypertension, Pt. on medications Normal cardiovascular exam Rhythm:Regular Rate:Normal     Neuro/Psych negative neurological ROS     GI/Hepatic negative GI ROS, Neg liver ROS, Gastroparesis    Endo/Other  diabetes, Type 2  Renal/GU Dialysis and ESRFRenal disease     Musculoskeletal infected toe, left   Abdominal   Peds  Hematology  (+) Blood dyscrasia, anemia ,   Anesthesia Other Findings Day of surgery medications reviewed with the patient.  Reproductive/Obstetrics                             Anesthesia Physical Anesthesia Plan  ASA: IV  Anesthesia Plan: General   Post-op Pain Management:    Induction: Intravenous  PONV Risk Score and Plan: 2  Airway Management Planned: LMA  Additional Equipment:   Intra-op Plan:   Post-operative Plan: Extubation in OR  Informed Consent: I have reviewed the patients History and Physical, chart, labs and discussed the procedure including the risks, benefits and alternatives for the proposed anesthesia with the patient or authorized representative who has indicated his/her understanding and acceptance.   Dental advisory given  Plan Discussed with: CRNA  Anesthesia Plan Comments: (Risks/benefits of general anesthesia discussed with patient including risk of damage to teeth, lips, gum, and tongue, nausea/vomiting, allergic reactions to medications, and the possibility of heart attack, stroke and death.  All patient questions answered.   Patient wishes to proceed.)        Anesthesia Quick Evaluation

## 2018-08-21 LAB — RENAL FUNCTION PANEL
Albumin: 2.3 g/dL — ABNORMAL LOW (ref 3.5–5.0)
Anion gap: 11 (ref 5–15)
BUN: 37 mg/dL — ABNORMAL HIGH (ref 6–20)
CO2: 25 mmol/L (ref 22–32)
Calcium: 8.6 mg/dL — ABNORMAL LOW (ref 8.9–10.3)
Chloride: 96 mmol/L — ABNORMAL LOW (ref 98–111)
Creatinine, Ser: 8.9 mg/dL — ABNORMAL HIGH (ref 0.61–1.24)
GFR calc Af Amer: 8 mL/min — ABNORMAL LOW (ref >60)
GFR calc non Af Amer: 7 mL/min — ABNORMAL LOW (ref >60)
Glucose, Bld: 221 mg/dL — ABNORMAL HIGH (ref 70–99)
Phosphorus: 6.4 mg/dL — ABNORMAL HIGH (ref 2.5–4.6)
Potassium: 5 mmol/L (ref 3.5–5.1)
Sodium: 132 mmol/L — ABNORMAL LOW (ref 135–145)

## 2018-08-21 LAB — CBC
HCT: 26.2 % — ABNORMAL LOW (ref 39.0–52.0)
Hemoglobin: 8 g/dL — ABNORMAL LOW (ref 13.0–17.0)
MCH: 29.3 pg (ref 26.0–34.0)
MCHC: 30.5 g/dL (ref 30.0–36.0)
MCV: 96 fL (ref 80.0–100.0)
Platelets: 291 10*3/uL (ref 150–400)
RBC: 2.73 MIL/uL — ABNORMAL LOW (ref 4.22–5.81)
RDW: 14.6 % (ref 11.5–15.5)
WBC: 15.8 10*3/uL — ABNORMAL HIGH (ref 4.0–10.5)
nRBC: 0 % (ref 0.0–0.2)

## 2018-08-21 LAB — GLUCOSE, CAPILLARY
Glucose-Capillary: 133 mg/dL — ABNORMAL HIGH (ref 70–99)
Glucose-Capillary: 143 mg/dL — ABNORMAL HIGH (ref 70–99)
Glucose-Capillary: 294 mg/dL — ABNORMAL HIGH (ref 70–99)
Glucose-Capillary: 442 mg/dL — ABNORMAL HIGH (ref 70–99)
Glucose-Capillary: 291 mg/dL — ABNORMAL HIGH (ref 70–99)

## 2018-08-21 MED ORDER — CALCITRIOL 0.5 MCG PO CAPS
ORAL_CAPSULE | ORAL | Status: AC
  Filled 2018-08-21: qty 4

## 2018-08-21 MED ORDER — LIDOCAINE-PRILOCAINE 2.5-2.5 % EX CREA: 1.0000 "application " | TOPICAL_CREAM | CUTANEOUS | Status: DC | PRN

## 2018-08-21 MED ORDER — INSULIN ASPART 100 UNIT/ML ~~LOC~~ SOLN
8.0000 [IU] | Freq: Once | SUBCUTANEOUS | Status: AC
  Administered 2018-08-21: 8 [IU] via SUBCUTANEOUS

## 2018-08-21 MED ORDER — SODIUM CHLORIDE 0.9 % IV SOLN: 100.0000 mL | INTRAVENOUS | Status: DC | PRN

## 2018-08-21 MED ORDER — HEPARIN SODIUM (PORCINE) 5000 UNIT/ML IJ SOLN
5000.0000 [IU] | Freq: Three times a day (TID) | INTRAMUSCULAR | Status: DC
  Filled 2018-08-21 (×3): qty 1

## 2018-08-21 MED ORDER — HEPARIN SODIUM (PORCINE) 1000 UNIT/ML DIALYSIS: 20.0000 [IU]/kg | INTRAMUSCULAR | Status: DC | PRN

## 2018-08-21 MED ORDER — HEPARIN SODIUM (PORCINE) 1000 UNIT/ML DIALYSIS: 1000.0000 [IU] | INTRAMUSCULAR | Status: DC | PRN

## 2018-08-21 MED ORDER — INSULIN ASPART 100 UNIT/ML ~~LOC~~ SOLN
5.0000 [IU] | Freq: Once | SUBCUTANEOUS | Status: AC
  Administered 2018-08-21: 5 [IU] via SUBCUTANEOUS

## 2018-08-21 MED ORDER — CALCITRIOL 0.25 MCG PO CAPS
ORAL_CAPSULE | ORAL | Status: AC
  Filled 2018-08-21: qty 1

## 2018-08-21 MED ORDER — PENTAFLUOROPROP-TETRAFLUOROETH EX AERO: 1.0000 "application " | INHALATION_SPRAY | CUTANEOUS | Status: DC | PRN

## 2018-08-21 MED ORDER — LIDOCAINE HCL (PF) 1 % IJ SOLN: 5.0000 mL | INTRAMUSCULAR | Status: DC | PRN

## 2018-08-21 NOTE — Progress Notes (Signed)
Patient returned to floor, smelling of questionable marijuana.  Argumentative with staff, demanding sandwich and chips from [employer] due to not eating supper because he didn't like it. Was informed he could not leave floor or would be considered leaving AMA at which point patient continued to be argumentative. [name] informed. Will be up to see patient as soon as possible.

## 2018-08-21 NOTE — Progress Notes (Signed)
Subjective:  Patient ID: [name], male    DOB: [birth year],  MRN: [id number]  Seen at bedside. In somewhat better spirits today. During our visit today he was somewhat emotional, including expressing concern about how recently he has had multiple surgeries on both his foot and arm and he feels like a "science experiment." He fears he will have to undergo surgery again and pleading to not have to do so.  Objective:   Vitals:   08/21/18 1115 08/21/18 1641  BP: 130/71 (!) 104/55  Pulse: 98 93  Resp: 18 18  Temp: 97.6 F (36.4 C) 97.8 F (36.6 C)  SpO2: 100% 100%   General AA&O x3. Normal mood and affect.  Vascular Foot warm and well perfused.  Neurologic Epicritic sensation grossly absent.  Dermatologic [place] on and funcitoning without seal leak.  Orthopedic: Motor intact distally.   Results for orders placed or performed during the hospital encounter of 08/17/18 (from the past 24 hour(s))  Glucose, capillary     Status: Abnormal   Collection Time: 08/20/18  9:06 PM  Result Value Ref Range   Glucose-Capillary 218 (H) 70 - 99 mg/dL  Glucose, capillary     Status: Abnormal   Collection Time: 08/20/18 10:41 PM  Result Value Ref Range   Glucose-Capillary 227 (H) 70 - 99 mg/dL  Glucose, capillary     Status: Abnormal   Collection Time: 08/21/18  3:15 AM  Result Value Ref Range   Glucose-Capillary 133 (H) 70 - 99 mg/dL  Renal function panel     Status: Abnormal   Collection Time: 08/21/18  7:31 AM  Result Value Ref Range   Sodium 132 (L) 135 - 145 mmol/L   Potassium 5.0 3.5 - 5.1 mmol/L   Chloride 96 (L) 98 - 111 mmol/L   CO2 25 22 - 32 mmol/L   Glucose, Bld 221 (H) 70 - 99 mg/dL   BUN 37 (H) 6 - 20 mg/dL   Creatinine, Ser 8.90 (H) 0.61 - 1.24 mg/dL   Calcium 8.6 (L) 8.9 - 10.3 mg/dL   Phosphorus 6.4 (H) 2.5 - 4.6 mg/dL   Albumin 2.3 (L) 3.5 - 5.0 g/dL   GFR calc non Af Amer 7 (L) >60 mL/min   GFR calc Af Amer 8 (L) >60 mL/min   Anion gap 11 5 - 15  CBC     Status:  Abnormal   Collection Time: 08/21/18  8:00 AM  Result Value Ref Range   WBC 15.8 (H) 4.0 - 10.5 K/uL   RBC 2.73 (L) 4.22 - 5.81 MIL/uL   Hemoglobin 8.0 (L) 13.0 - 17.0 g/dL   HCT 26.2 (L) 39.0 - 52.0 %   MCV 96.0 80.0 - 100.0 fL   MCH 29.3 26.0 - 34.0 pg   MCHC 30.5 30.0 - 36.0 g/dL   RDW 14.6 11.5 - 15.5 %   Platelets 291 150 - 400 K/uL   nRBC 0.0 0.0 - 0.2 %  Glucose, capillary     Status: Abnormal   Collection Time: 08/21/18 11:19 AM  Result Value Ref Range   Glucose-Capillary 143 (H) 70 - 99 mg/dL  Glucose, capillary     Status: Abnormal   Collection Time: 08/21/18  4:40 PM  Result Value Ref Range   Glucose-Capillary 294 (H) 70 - 99 mg/dL   Results for orders placed or performed during the hospital encounter of 08/17/18  MRSA PCR Screening     Status: None   Collection Time: 08/18/18  2:28 AM  Result Value Ref Range Status   MRSA by PCR NEGATIVE NEGATIVE Final    Comment:        The GeneXpert MRSA Assay (FDA approved for NASAL specimens only), is one component of a comprehensive MRSA colonization surveillance program. It is not intended to diagnose MRSA infection nor to guide or monitor treatment for MRSA infections. Performed at [facility], [place] [address], [place], [place] [address]   Surgical pcr screen     Status: None   Collection Time: 08/18/18 10:08 AM  Result Value Ref Range Status   MRSA, PCR NEGATIVE NEGATIVE Final   Staphylococcus aureus NEGATIVE NEGATIVE Final    Comment: (NOTE) The Xpert SA Assay (FDA approved for NASAL specimens in patients 56 years of age and older), is one component of a comprehensive surveillance program. It is not intended to diagnose infection nor to guide or monitor treatment. Performed at [facility], [place] [address], [place], [place] [address]   Fungus Culture With Stain     Status: None (Preliminary result)   Collection Time: 08/18/18  2:00 PM  Result Value Ref Range Status   Fungus Stain Final report   Final    Comment: (NOTE) Performed At: [facility] [place], [place] [id number] Rush Farmer MD XQ:[id number]    Fungus (Mycology) Culture PENDING  Incomplete   Fungal Source BONE  Final    Comment: LEFT 5TH Performed at [facility], [place] [address] [place], [place] [address]   Aerobic/Anaerobic Culture (surgical/deep wound)     Status: None (Preliminary result)   Collection Time: 08/18/18  2:00 PM  Result Value Ref Range Status   Specimen Description BONE LEFT 5TH  Final   Special Requests NONE  Final   Gram Stain   Final    RARE WBC PRESENT, PREDOMINANTLY PMN FEW GRAM NEGATIVE RODS    Culture   Final    HOLDING FOR POSSIBLE ANAEROBE Performed at [facility], [place] [address], [place], [place] [address]    Report Status PENDING  Incomplete  Fungus Culture Result     Status: None   Collection Time: 08/18/18  2:00 PM  Result Value Ref Range Status   Result 1 Comment  Final    Comment: (NOTE) KOH/Calcofluor preparation:  no fungus observed. Performed At: [facility] [place], [place] [id number] Rush Farmer MD YI:[id number]   Fungus Culture With Stain     Status: None (Preliminary result)   Collection Time: 08/18/18  2:01 PM  Result Value Ref Range Status   Fungus Stain Final report  Final    Comment: (NOTE) Performed At: [facility] [place], [place] [id number] Rush Farmer MD BS:[id number]    Fungus (Mycology) Culture PENDING  Incomplete   Fungal Source TISSUE  Final    Comment: LEFT 5TH Performed at [facility], [place] [address], [place], [place] [address]   Aerobic/Anaerobic Culture (surgical/deep wound)     Status: None (Preliminary result)   Collection Time: 08/18/18  2:01 PM  Result Value Ref Range Status   Specimen Description TISSUE LEFT 5TH  Final   Special Requests NONE  Final   Gram Stain NO WBC SEEN FEW GRAM NEGATIVE RODS   Final   Culture   Final     HOLDING FOR POSSIBLE ANAEROBE Performed at [facility], [address], [place], [place] [address]    Report Status PENDING  Incomplete  Fungus Culture Result     Status: None   Collection Time: 08/18/18  2:01 PM  Result Value Ref Range Status   Result 1 Comment  Final    Comment: (NOTE) KOH/Calcofluor preparation:  no fungus observed. Performed At: [facility] [place], [place] [id number] Rush Farmer MD OO:[id number]     Assessment & Plan:  Patient was evaluated and treated and all questions answered.  Diabetic foot infection L foot. -Labs reviewed. WBC bump today, likely post-surgical, will trend. Expect to normalize tomorrow. -Micro pending. GNRs on GS. Holding for anaerobe. -Path pending. -Continue abx. ID Following. Appreciate reccs. -Continue [place]. Discussed with patient possibly continuing [place] outpatient, patient resistant. Discussed the possible of importance of VAC therapy, but patient again very resistant and promising to be more compliant with his shoe this time to avoid having to use this modality. Discussed holding off definitive decision until tomorrow when the [facility] is removed. If the wound remains deep will likely need the VAC as the entirety of the wound was not able to be closed. -No plans for RTOR -Likely ok for discharge after finalization of c/s and Abx reccs. May or may not need [facility] at discharge. Has HHC already set up, will need to provide updated orders. -Will remove VAC tomorrow and evaluate wound site and provide updated reccs.  Podiatry will follow.

## 2018-08-21 NOTE — Progress Notes (Addendum)
[facility] Progress Note   Subjective:  Seen in HD unit. UF goal 5.7L Tolerating HD. Has some customer service complaints.   Objective Vitals:   08/21/18 0720 08/21/18 0730 08/21/18 0800 08/21/18 0830  BP: (!) 174/68 (!) 153/121 (!) 99/57 (!) 101/50  Pulse: 95 62 70 98  Resp:      Temp:      TempSrc:      SpO2:      Weight:      Height:       Physical Exam General: WNWD male NAD, mood labile  Heart: RRR no murmur Lungs: CTAB  Abdomen: soft NT, ND Extremities: L foot bandaged +woundVAC Dialysis Access: LUE AVG cannulated on HD   Dialysis: [facility] 4h   2/2 bath 75.5kg   Hep 5000   LUA AVG Calcitriol 2.13mcg Ipo/HD No ESA Last HGB 11.7 06/29/18 (last mircera 50 on 08/03/18)  Assessment/Plan: 1. ESRD -HD MWF schedule. HD today on holiday schedule.  2. 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 S/p 5th toe amp and wound vac 11/21. Back to OR 11/23 for I&D, bone biopsy. Cultures pending. On Zosyn. ID following.  3. Hypertension/volume BP stable. Weights up today/ OP meds On amlodipine 10 here. 4. Anemia - hgb 10.8>8.0  Aranesp 40 q Friday. Follow trends   5. Metabolic bone disease -Continue Calcitriol/Renvela binder  6. DM type 1- per admit   Lynnda Child PA-C [facility] Pager [phone] 08/21/2018,8:57 AM  LOS: 4 days   Pt seen, examined and agree w A/P as above.  Kelly Splinter MD [name] pager [phone]   08/21/2018, 1:59 PM     Additional Objective Labs: Basic Metabolic Panel: Recent Labs  Lab 08/18/18 0719 08/19/18 0534 08/20/18 0459 08/21/18 0731  NA 131* 129* 134* 132*  K 4.7 5.4* 4.2 5.0  CL 92* 91* 96* 96*  CO2 25 25 26 25   GLUCOSE 188* 289* 84 221*  BUN 47* 58* 26* 37*  CREATININE 10.45* 12.34* 7.71* 8.90*  CALCIUM 8.8* 8.6* 8.9 8.6*  PHOS 4.9*  --   --  6.4*   CBC: Recent Labs  Lab 08/17/18 2227 08/18/18 0719 08/19/18 0534 08/20/18 0459  08/21/18 0800  WBC 15.3* 14.0* 17.9* 9.9 15.8*  HGB 8.4* 9.3* 8.3* 10.8* 8.0*  HCT 27.3* 29.8* 26.4* 35.2* 26.2*  MCV 92.9 92.5 93.0 94.4 96.0  PLT 221 261 237 238 291   Blood Culture    Component Value Date/Time   SDES TISSUE LEFT 5TH 08/18/2018 1401   SPECREQUEST NONE 08/18/2018 1401   CULT  08/18/2018 1401    HOLDING FOR POSSIBLE ANAEROBE Performed at [facility], [place] [address] [place], [place] [address]    REPTSTATUS PENDING 08/18/2018 1401    Cardiac Enzymes: No results for input(s): CKTOTAL, CKMB, CKMBINDEX, TROPONINI in the last 168 hours. CBG: Recent Labs  Lab 08/20/18 1314 08/20/18 1605 08/20/18 2106 08/20/18 2241 08/21/18 0315  GLUCAP 149* 187* 218* 227* 133*   Iron Studies: No results for input(s): IRON, TIBC, TRANSFERRIN, FERRITIN in the last 72 hours. Lab Results  Component Value Date   INR 1.00 02/05/2018   INR 1.16 10/25/2017   Medications: . sodium chloride 10 mL/hr at 08/20/18 0948  . sodium chloride    . sodium chloride    . piperacillin-tazobactam (ZOSYN)  IV 3.375 g (08/20/18 2242)   . amLODipine  10 mg Oral Daily  . calcitRIOL  2.25 mcg Oral Q M,W,F-HD  .  Chlorhexidine Gluconate Cloth  6 each Topical Q0600  . cinacalcet  30 mg Oral Q supper  . collagenase  1 application Topical Daily  . darbepoetin (ARANESP) injection - DIALYSIS  40 mcg Intravenous Q Fri-HD  . insulin aspart  0-5 Units Subcutaneous QHS  . insulin aspart  0-9 Units Subcutaneous TID WC  . insulin aspart  5 Units Subcutaneous TID WC  . insulin aspart  7 Units Subcutaneous Once  . insulin glargine  10 Units Subcutaneous QHS  . metoCLOPramide  5 mg Oral TID AC  . sevelamer carbonate  3,200 mg Oral BID WC

## 2018-08-21 NOTE — Progress Notes (Signed)
Patient called this RN on phone to let know his CBG was just taken at 2100 and was 442. After spewing an abundance of profanity about not waiting to take his Insulin, this RN stated she was on her way to get medication. At 2106, 5 units of Insulin given and on call texted. At 2111, Arby Barrette returned call. Informed him of CBG, Insulin given and that patient stated he was going to take his own insulin because 5 units was not enough. Charles ordered another 5 units to be given. 2113, order received. 2118 5 units of insulin given. Patient refuses to give RN insulin to be locked up in Pharmacy after spewing more profanity. Patient continues to eat,drink whatever he wants, not adhering to diet. Family brings food in from outside. Impossible to monitor correct I/O's when you do not know what patient is getting from outside. Has large box of NERD candy at bedside. Patient got out of bed earlier this evening and was found by Raquel, NT coming out of shower. NSL had not been wrapped to keep from getting wet, Prevena dressing still on foot but also not wrapped to prevent getting wet. While writing this note, patient left floor without permission. Charles notified and [facility] "Gerald Stabs" notified.

## 2018-08-21 NOTE — Progress Notes (Signed)
INFECTIOUS DISEASE PROGRESS NOTE  ID: [name] is a 37 y.o. male with  Principal Problem:   Diabetic foot infection ([place]) Active Problems:   Diabetes mellitus type 1 ([place])   Diabetic gastroparesis ([place])   HTN (hypertension)   Cellulitis and abscess of foot   ESRD (end stage renal disease) ([place])   Anemia of chronic disease   Acute osteomyelitis of left ankle or foot ([name])   Osteomyelitis of fifth toe of left foot ([name])   Diabetic ulcer of left midfoot associated with diabetes mellitus due to underlying condition, with necrosis of bone ([place])   Encounter for planned post-operative wound closure   Osteomyelitis of foot, left, acute ([name])  Subjective: No complaints.   Abtx:  Anti-infectives (From admission, onward)   Start     Dose/Rate Route Frequency Ordered Stop   08/20/18 1120  vancomycin (VANCOCIN) powder  Status:  Discontinued       As needed 08/20/18 1121 08/20/18 1258   08/19/18 1200  vancomycin (VANCOCIN) IVPB 750 mg/150 ml premix  Status:  Discontinued     750 mg 150 mL/hr over 60 Minutes Intravenous Every M-W-F (Hemodialysis) 08/17/18 2321 08/19/18 1313   08/18/18 1350  vancomycin (VANCOCIN) powder  Status:  Discontinued       As needed 08/18/18 1410 08/18/18 1417   08/17/18 2330  vancomycin (VANCOCIN) 1,500 mg in sodium chloride 0.9 % 500 mL IVPB     1,500 mg 250 mL/hr over 120 Minutes Intravenous  Once 08/17/18 2319 08/18/18 0339   08/17/18 2245  piperacillin-tazobactam (ZOSYN) IVPB 3.375 g     3.375 g 12.5 mL/hr over 240 Minutes Intravenous Every 12 hours 08/17/18 2235        Medications:  Scheduled: . amLODipine  10 mg Oral Daily  . calcitRIOL  2.25 mcg Oral Q M,W,F-HD  . Chlorhexidine Gluconate Cloth  6 each Topical Q0600  . cinacalcet  30 mg Oral Q supper  . collagenase  1 application Topical Daily  . darbepoetin (ARANESP) injection - DIALYSIS  40 mcg Intravenous Q Fri-HD  . insulin aspart  0-5 Units Subcutaneous QHS  . insulin aspart  0-9 Units  Subcutaneous TID WC  . insulin aspart  5 Units Subcutaneous TID WC  . insulin aspart  7 Units Subcutaneous Once  . insulin glargine  10 Units Subcutaneous QHS  . metoCLOPramide  5 mg Oral TID AC  . sevelamer carbonate  3,200 mg Oral BID WC    Objective: Vital signs in last 24 hours: Temp:  [97.9 F (36.6 C)-98.6 F (37 C)] 98 F (36.7 C) (11/24 0700) Pulse Rate:  [62-110] 96 (11/24 1000) Resp:  [13-19] 18 (11/24 0700) BP: (99-197)/(44-121) 117/49 (11/24 1000) SpO2:  [83 %-100 %] 100 % (11/24 0700) Weight:  [73.7 kg-81 kg] 81 kg (11/24 0700)   General appearance: alert, cooperative and no distress Resp: clear to auscultation bilaterally Cardio: regular rate and rhythm GI: normal findings: bowel sounds normal and soft, non-tender Extremities: L foot dressed.   Lab Results Recent Labs    08/20/18 0459 08/21/18 0731 08/21/18 0800  WBC 9.9  --  15.8*  HGB 10.8*  --  8.0*  HCT 35.2*  --  26.2*  NA 134* 132*  --   K 4.2 5.0  --   CL 96* 96*  --   CO2 26 25  --   BUN 26* 37*  --   CREATININE 7.71* 8.90*  --    Liver Panel Recent Labs  08/21/18 0731  ALBUMIN 2.3*   Sedimentation Rate No results for input(s): ESRSEDRATE in the last 72 hours. C-Reactive Protein No results for input(s): CRP in the last 72 hours.  Microbiology: Recent Results (from the past 240 hour(s))  MRSA PCR Screening     Status: None   Collection Time: 08/18/18  2:28 AM  Result Value Ref Range Status   MRSA by PCR NEGATIVE NEGATIVE Final    Comment:        The GeneXpert MRSA Assay (FDA approved for NASAL specimens only), is one component of a comprehensive MRSA colonization surveillance program. It is not intended to diagnose MRSA infection nor to guide or monitor treatment for MRSA infections. Performed at [facility], [place] [address] [place], [place] [address]   Surgical pcr screen     Status: None   Collection Time: 08/18/18 10:08 AM  Result Value Ref Range Status    MRSA, PCR NEGATIVE NEGATIVE Final   Staphylococcus aureus NEGATIVE NEGATIVE Final    Comment: (NOTE) The Xpert SA Assay (FDA approved for NASAL specimens in patients 16 years of age and older), is one component of a comprehensive surveillance program. It is not intended to diagnose infection nor to guide or monitor treatment. Performed at [facility], [place] [address], [place], [place] [address]   Fungus Culture With Stain     Status: None (Preliminary result)   Collection Time: 08/18/18  2:00 PM  Result Value Ref Range Status   Fungus Stain Final report  Final    Comment: (NOTE) Performed At: [facility] [place], [place] [id number] Rush Farmer MD EH:[id number]    Fungus (Mycology) Culture PENDING  Incomplete   Fungal Source BONE  Final    Comment: LEFT 5TH Performed at [facility], [place] [address] [place], [place] [address]   Aerobic/Anaerobic Culture (surgical/deep wound)     Status: None (Preliminary result)   Collection Time: 08/18/18  2:00 PM  Result Value Ref Range Status   Specimen Description BONE LEFT 5TH  Final   Special Requests NONE  Final   Gram Stain   Final    RARE WBC PRESENT, PREDOMINANTLY PMN FEW GRAM NEGATIVE RODS    Culture   Final    HOLDING FOR POSSIBLE ANAEROBE Performed at [facility], [place] [address], [place], [place] [address]    Report Status PENDING  Incomplete  Fungus Culture Result     Status: None   Collection Time: 08/18/18  2:00 PM  Result Value Ref Range Status   Result 1 Comment  Final    Comment: (NOTE) KOH/Calcofluor preparation:  no fungus observed. Performed At: [facility] [place], [place] [id number] Rush Farmer MD [id number]   Fungus Culture With Stain     Status: None (Preliminary result)   Collection Time: 08/18/18  2:01 PM  Result Value Ref Range Status   Fungus Stain Final report  Final    Comment: (NOTE) Performed At: [facility] [place], [place] [id number] Rush Farmer MD YT:[id number]    Fungus (Mycology) Culture PENDING  Incomplete   Fungal Source TISSUE  Final    Comment: LEFT 5TH Performed at [facility], [place] [address], [place], [place] [address]   Aerobic/Anaerobic Culture (surgical/deep wound)     Status: None (Preliminary result)   Collection Time: 08/18/18  2:01 PM  Result Value Ref Range Status   Specimen Description TISSUE LEFT  5TH  Final   Special Requests NONE  Final   Gram Stain NO WBC SEEN FEW GRAM NEGATIVE RODS   Final   Culture   Final    HOLDING FOR POSSIBLE ANAEROBE Performed at [facility], [place] [address], [place], [place] [address]    Report Status PENDING  Incomplete  Fungus Culture Result     Status: None   Collection Time: 08/18/18  2:01 PM  Result Value Ref Range Status   Result 1 Comment  Final    Comment: (NOTE) KOH/Calcofluor preparation:  no fungus observed. Performed At: [facility] [place], [place] [id number] Rush Farmer MD [id number]     Studies/Results: Dg Foot 2 Views Left  Result Date: 08/20/2018 CLINICAL DATA:  S/p 5th metatarsal osteomyelitis resection. EXAM: LEFT FOOT - 2 VIEW COMPARISON:  08/19/2017 FINDINGS: Resection of a portion of the remaining 5th metatarsal noted. Soft tissue postoperative changes identified. No acute bony abnormality otherwise noted. Vascular calcifications are identified. IMPRESSION: Further resection of portion of the remaining 5th metatarsal. Electronically Signed   By: Margarette Canada M.D.   On: 08/20/2018 15:26   Dg Foot 2 Views Left  Result Date: 08/19/2018 CLINICAL DATA:  S/P IRRIGATION AND DEBRIDEMENT OF LEFT 5TH TOE AND TRANSMETATARSAL, WITH PARTICAL LEFT 5TH TOE AND METATARSAL AMPUTATION, BONE BIOPSY, WOUND VAC APPLICATION FROM YESTERDAY. EXAM: LEFT FOOT - 2 VIEW COMPARISON:  Plain film of the LEFT foot dated 08/11/2018. MRI of the LEFT foot dated 08/18/2018  FINDINGS: Surgical changes of fifth metatarsal amputation, with metatarsal base remaining. Expected postsurgical changes within the overlying soft tissues. No abnormal soft tissue air. Remaining osseous structures appear intact and normal in mineralization. IMPRESSION: Status post fifth metatarsal amputation. Expected postsurgical changes within the overlying soft tissues. No evidence of surgical complicating feature. Electronically Signed   By: Franki Cabot M.D.   On: 08/19/2018 19:34     Assessment/Plan: Diabetic Foot Ulcer with gangrene DM2 CKD/ESRD  Total days of antibiotics: 4 zosyn  Tolerating HD well Await sensi of GNR seen on both Cx. Possible anaerobe.  WBC 9.9 --> 15.8. Will continue to watch.          Bobby Rumpf MD, FACP Infectious Diseases (pager) [phone] [email or web address] 08/21/2018, 10:19 AM  LOS: 4 days

## 2018-08-21 NOTE — Progress Notes (Signed)
Patient not compliant with fluid restrictions, tried to educated but he became mad and said "I know my body and have been on HD from 2014, and always have been like this since".

## 2018-08-21 NOTE — Progress Notes (Signed)
Spoke with patient regarding leaving the unit and ignoring his nurse from taking off the unit. Patient wreaked of marijuana smell permeating down the hallways. Requested for sandwich. Explained to him his CBG is elevated. Patient will not listen to RN.

## 2018-08-21 NOTE — Progress Notes (Signed)
[facility]                                                                              Patient Demographics  [name], is a 37 y.o. male, DOB - 1980/12/13, DJM:[id number]  Admit date - 08/17/2018   Admitting Physician Janora Norlander, MD  Outpatient Primary MD for the patient is [name], [name], MD  Outpatient specialists:   LOS - 4  days   Medical records reviewed and are as summarized below:    No chief complaint on file.      Brief summary   Patient is a 37 year old male with hypertension, ESRD on hemodialysis MWF, uncontrolled diabetes type 1, chronic diabetic left foot ulcer, was sent from podiatry office to the hospital on 11/20 for worsening of the left foot ulcer.  Patient had noted strong malodor with heavy drainage from the ulcer.  He had been using Santyl on the wound and last clinic visit or was on 11/15 and had been on clindamycin twice daily.  Assessment & Plan    Principal Problem: Left diabetic foot infection ([place]) with gangrene of the left foot -Patient presented with leukocytosis of 15.3, heavy drainage from his ulcer on the left foot  -Podiatry service, Dr. March Rummage was consulted, patient underwent I&D of the left fifth toe and transmetatarsal with partial left fifth toe and metatarsal amputation with wound VAC on 11/21 -MRI of the left foot showed cellulitis with osteomyelitis of the fifth metatarsal head, base of fifth proximal phalanx with severe surrounding edema. -Went back to the OR on 08/20/2018, underwent bone biopsy, metatarsal resection, secondary closure of surgical wound and wound VAC placement  -Wound cultures from 11/21 showing gram-negative rods, continue Zosyn.  Vancomycin discontinued on 11/22 -Podiatry following, consulted ID, Dr. Johnnye Sima, following, continue Zosyn and follow cultures  Active Problems:   Diabetes mellitus type 1 ([place]), noncompliant, uncontrolled, insulin-dependent, with hyperglycemia, severely  noncompliant -Hemoglobin A1c 7.3 -Patient has been recommended strongly not to take his own insulin, please see my detailed note on 08/19/2018.  Refuses carb modified diet. -Seen in hemodialysis unit, patient became verbally abusive stating that the nursing director and all the team members did not come to his room on 11/22 and I was supposed to bring them and have a meeting with him in his room. I had told him on 11/22 that I will address his concerns with the nursing staff which I did on the same day. He had been self administering his home insulin in the room, severely noncompliant with his diet and was hostile towards the staff - patient then started using words of profanity as I walked away.  I was subsequently called that patient wants to be treated by different attending physician.     Diabetic gastroparesis ([name]) -Continue Zofran as needed, Reglan    HTN (hypertension) -BP stable, continue Norvasc    ESRD (end stage renal disease) ([place]) on hemodialysis -Nephrology consulted, on HD MWF, receiving HD per his schedule    Anemia of chronic disease -H&H, stable, follow CBC in a.m.  History of bipolar disorder, noncompliance -Continue Klonopin.  His mood varies from pleasant to  using words of profanity and hostile  Code Status: Full CODE STATUS DVT Prophylaxis: Heparin subcu Family Communication: Discussed in detail with the patient, all imaging results, lab results explained to the patient.   Disposition Plan: Patient was seen today in the hemodialysis unit and became verbally abusive to me, using words of profanity.  I was called subsequently that patient wants to be treated by different attending physician. Discussed with Dr Clementeen Graham (director), d/w Dr Roseanne Kaufman who will see patient from tomorrow 11/25  Time Spent in minutes 25 minutes  Procedures:  08/18/2018 : I&D of left fifth toe, transmetatarsal with partial left fifth toe and metatarsal amputation, bone biopsy and wound  VAC  08/20/2018:  1) Secondary closure of surgical wound             2) Metatarsal resection             3) Bone biopsy             4) Application of Wound VAC  Hemodialysis  Consultants:   Podiatry Nephrology Infectious disease  Antimicrobials:   IV Zosyn   Medications  Scheduled Meds: . amLODipine  10 mg Oral Daily  . calcitRIOL  2.25 mcg Oral Q M,W,F-HD  . Chlorhexidine Gluconate Cloth  6 each Topical Q0600  . cinacalcet  30 mg Oral Q supper  . collagenase  1 application Topical Daily  . darbepoetin (ARANESP) injection - DIALYSIS  40 mcg Intravenous Q Fri-HD  . insulin aspart  0-5 Units Subcutaneous QHS  . insulin aspart  0-9 Units Subcutaneous TID WC  . insulin aspart  5 Units Subcutaneous TID WC  . insulin aspart  7 Units Subcutaneous Once  . insulin glargine  10 Units Subcutaneous QHS  . metoCLOPramide  5 mg Oral TID AC  . sevelamer carbonate  3,200 mg Oral BID WC   Continuous Infusions: . sodium chloride 10 mL/hr at 08/20/18 0948  . sodium chloride    . sodium chloride    . piperacillin-tazobactam (ZOSYN)  IV 3.375 g (08/20/18 2242)   PRN Meds:.sodium chloride, sodium chloride, acetaminophen, clonazePAM, heparin, heparin, lidocaine (PF), lidocaine-prilocaine, morphine injection, ondansetron, pentafluoroprop-tetrafluoroeth   Antibiotics   Anti-infectives (From admission, onward)   Start     Dose/Rate Route Frequency Ordered Stop   08/20/18 1120  vancomycin (VANCOCIN) powder  Status:  Discontinued       As needed 08/20/18 1121 08/20/18 1258   08/19/18 1200  vancomycin (VANCOCIN) IVPB 750 mg/150 ml premix  Status:  Discontinued     750 mg 150 mL/hr over 60 Minutes Intravenous Every M-W-F (Hemodialysis) 08/17/18 2321 08/19/18 1313   08/18/18 1350  vancomycin (VANCOCIN) powder  Status:  Discontinued       As needed 08/18/18 1410 08/18/18 1417   08/17/18 2330  vancomycin (VANCOCIN) 1,500 mg in sodium chloride 0.9 % 500 mL IVPB     1,500 mg 250  mL/hr over 120 Minutes Intravenous  Once 08/17/18 2319 08/18/18 0339   08/17/18 2245  piperacillin-tazobactam (ZOSYN) IVPB 3.375 g     3.375 g 12.5 mL/hr over 240 Minutes Intravenous Every 12 hours 08/17/18 2235          Subjective:   [name] was seen and examined today.  Seen during hemodialysis, in the beginning was calm and denied any complaints, pain controlled in the foot.  Then became verbally hostile, please see above  Objective:   Vitals:   08/21/18 0830 08/21/18 0900 08/21/18 0930 08/21/18 1000  BP: (!) 101/50  131/88 130/86 (!) 117/49  Pulse: 98 (!) 110 66 96  Resp:      Temp:      TempSrc:      SpO2:      Weight:      Height:        Intake/Output Summary (Last 24 hours) at 08/21/2018 1024 Last data filed at 08/21/2018 0700 Gross per 24 hour  Intake 1340 ml  Output 50 ml  Net 1290 ml     Wt Readings from Last 3 Encounters:  08/21/18 81 kg  05/26/18 83.9 kg  05/25/18 83.9 kg   Physical Exam  General: Alert and oriented x 3, NAD  Eyes:   HEENT:    Cardiovascular: S1 S2 clear, RRR, No pedal edema b/l  Respiratory: CTA B  Gastrointestinal: Soft, nontender, nondistended, + bowel sounds  Ext: no pedal edema bilaterally  Neuro: moving all 4 extremities  Musculoskeletal:   Skin: Left foot dressing intact with wound VAC  Psych: verbally abusive, using words of profanity       Data Reviewed:  I have personally reviewed following labs and imaging studies  Micro Results Recent Results (from the past 240 hour(s))  MRSA PCR Screening     Status: None   Collection Time: 08/18/18  2:28 AM  Result Value Ref Range Status   MRSA by PCR NEGATIVE NEGATIVE Final    Comment:        The GeneXpert MRSA Assay (FDA approved for NASAL specimens only), is one component of a comprehensive MRSA colonization surveillance program. It is not intended to diagnose MRSA infection nor to guide or monitor treatment for MRSA infections. Performed at [facility], [place] [address], [place], [place] [address]   Surgical pcr screen     Status: None   Collection Time: 08/18/18 10:08 AM  Result Value Ref Range Status   MRSA, PCR NEGATIVE NEGATIVE Final   Staphylococcus aureus NEGATIVE NEGATIVE Final    Comment: (NOTE) The Xpert SA Assay (FDA approved for NASAL specimens in patients 45 years of age and older), is one component of a comprehensive surveillance program. It is not intended to diagnose infection nor to guide or monitor treatment. Performed at [facility], [place] [address] [place], [place] [address]   Fungus Culture With Stain     Status: None (Preliminary result)   Collection Time: 08/18/18  2:00 PM  Result Value Ref Range Status   Fungus Stain Final report  Final    Comment: (NOTE) Performed At: [facility] [place], [place] [id number] Rush Farmer MD [id number]    Fungus (Mycology) Culture PENDING  Incomplete   Fungal Source BONE  Final    Comment: LEFT 5TH Performed at [facility], [place] [address], [place],  [address]   Aerobic/Anaerobic Culture (surgical/deep wound)     Status: None (Preliminary result)   Collection Time: 08/18/18  2:00 PM  Result Value Ref Range Status   Specimen Description BONE LEFT 5TH  Final   Special Requests NONE  Final   Gram Stain   Final    RARE WBC PRESENT, PREDOMINANTLY PMN FEW GRAM NEGATIVE RODS    Culture   Final    HOLDING FOR POSSIBLE ANAEROBE Performed at [facility], [place] [address], [place],  [address]    Report Status PENDING  Incomplete  Fungus Culture Result     Status: None   Collection Time: 08/18/18  2:00 PM  Result  Value Ref Range Status   Result 1 Comment  Final    Comment: (NOTE) KOH/Calcofluor preparation:  no fungus observed. Performed At: [facility] [place], [place] [id number] Rush Farmer MD [id number]   Fungus Culture With Stain     Status: None (Preliminary  result)   Collection Time: 08/18/18  2:01 PM  Result Value Ref Range Status   Fungus Stain Final report  Final    Comment: (NOTE) Performed At: [facility] [place], [place] [id number] Rush Farmer MD NO:[id number]    Fungus (Mycology) Culture PENDING  Incomplete   Fungal Source TISSUE  Final    Comment: LEFT 5TH Performed at [facility], [place] [address], [place], [place] [address]   Aerobic/Anaerobic Culture (surgical/deep wound)     Status: None (Preliminary result)   Collection Time: 08/18/18  2:01 PM  Result Value Ref Range Status   Specimen Description TISSUE LEFT 5TH  Final   Special Requests NONE  Final   Gram Stain NO WBC SEEN FEW GRAM NEGATIVE RODS   Final   Culture   Final    HOLDING FOR POSSIBLE ANAEROBE Performed at [facility], [address] [place], [place] [address]    Report Status PENDING  Incomplete  Fungus Culture Result     Status: None   Collection Time: 08/18/18  2:01 PM  Result Value Ref Range Status   Result 1 Comment  Final    Comment: (NOTE) KOH/Calcofluor preparation:  no fungus observed. Performed At: [facility] [place], [place] [id number] Rush Farmer MD WS:[id number]     Radiology Reports Mr Foot Left Wo Contrast  Result Date: 08/18/2018 CLINICAL DATA:  Diabetic foot ulcer along the lateral forefoot. EXAM: MRI OF THE LEFT FOOT WITHOUT CONTRAST TECHNIQUE: Multiplanar, multisequence MR imaging of the left was performed. No intravenous contrast was administered. COMPARISON:  None. FINDINGS: Bones/Joint/Cartilage Soft tissue wound along the plantar lateral aspect of the fifth MTP joint extending to the cortex. Cortical destruction with severe bone marrow edema in the base of the fifth proximal phalanx and fifth metatarsal head most consistent with osteomyelitis. Mild marrow edema in the fifth middle phalanx which may be reactive versus secondary to osteomyelitis. No other marrow  signal abnormality. No fracture or dislocation. Normal alignment. No joint effusion. Ligaments Lisfranc ligament is intact.  Collateral ligaments are intact. Muscles and Tendons Flexor, peroneal and extensor compartment tendons are intact. Mild muscle atrophy. Soft tissue No fluid collection or hematoma. No soft tissue mass. Soft tissue edema in the lateral forefoot extending into the fifth digit most consistent with cellulitis. Small focus of low signal in the soft tissues along the medial aspect of the fifth digit likely reflecting a small amount of air. IMPRESSION: 1. Soft tissue wound along the plantar lateral aspect of the fifth MTP joint with surrounding cellulitis. Osteomyelitis of the fifth metatarsal head and base of the fifth proximal phalanx with severe surrounding marrow edema. Electronically Signed   By: Kathreen Devoid   On: 08/18/2018 10:08   Dg Foot 2 Views Left  Result Date: 08/20/2018 CLINICAL DATA:  S/p 5th metatarsal osteomyelitis resection. EXAM: LEFT FOOT - 2 VIEW COMPARISON:  08/19/2017 FINDINGS: Resection of a portion of the remaining 5th metatarsal noted. Soft tissue postoperative changes identified. No acute bony abnormality otherwise noted. Vascular calcifications are identified. IMPRESSION: Further resection of portion of the remaining 5th metatarsal. Electronically Signed   By: Cleatis Polka.D.  On: 08/20/2018 15:26   Dg Foot 2 Views Left  Result Date: 08/19/2018 CLINICAL DATA:  S/P IRRIGATION AND DEBRIDEMENT OF LEFT 5TH TOE AND TRANSMETATARSAL, WITH PARTICAL LEFT 5TH TOE AND METATARSAL AMPUTATION, BONE BIOPSY, WOUND VAC APPLICATION FROM YESTERDAY. EXAM: LEFT FOOT - 2 VIEW COMPARISON:  Plain film of the LEFT foot dated 08/11/2018. MRI of the LEFT foot dated 08/18/2018 FINDINGS: Surgical changes of fifth metatarsal amputation, with metatarsal base remaining. Expected postsurgical changes within the overlying soft tissues. No abnormal soft tissue air. Remaining osseous structures  appear intact and normal in mineralization. IMPRESSION: Status post fifth metatarsal amputation. Expected postsurgical changes within the overlying soft tissues. No evidence of surgical complicating feature. Electronically Signed   By: Franki Cabot M.D.   On: 08/19/2018 19:34   Dg Foot Complete Left  Result Date: 08/11/2018 Please see detailed radiograph report in office note.  Dg Foot Complete Left  Result Date: 08/04/2018 Please see detailed radiograph report in office note.   Lab Data:  CBC: Recent Labs  Lab 08/17/18 2227 08/18/18 0719 08/19/18 0534 08/20/18 0459 08/21/18 0800  WBC 15.3* 14.0* 17.9* 9.9 15.8*  HGB 8.4* 9.3* 8.3* 10.8* 8.0*  HCT 27.3* 29.8* 26.4* 35.2* 26.2*  MCV 92.9 92.5 93.0 94.4 96.0  PLT 221 261 237 238 160   Basic Metabolic Panel: Recent Labs  Lab 08/17/18 2227 08/18/18 0719 08/19/18 0534 08/20/18 0459 08/21/18 0731  NA 133* 131* 129* 134* 132*  K 4.2 4.7 5.4* 4.2 5.0  CL 94* 92* 91* 96* 96*  CO2 26 25 25 26 25   GLUCOSE 107* 188* 289* 84 221*  BUN 44* 47* 58* 26* 37*  CREATININE 9.86* 10.45* 12.34* 7.71* 8.90*  CALCIUM 8.5* 8.8* 8.6* 8.9 8.6*  PHOS  --  4.9*  --   --  6.4*   GFR: Estimated Creatinine Clearance: 11.3 mL/min (A) (by C-G formula based on SCr of 8.9 mg/dL (H)). Liver Function Tests: Recent Labs  Lab 08/18/18 0719 08/21/18 0731  ALBUMIN 2.6* 2.3*   No results for input(s): LIPASE, AMYLASE in the last 168 hours. No results for input(s): AMMONIA in the last 168 hours. Coagulation Profile: No results for input(s): INR, PROTIME in the last 168 hours. Cardiac Enzymes: No results for input(s): CKTOTAL, CKMB, CKMBINDEX, TROPONINI in the last 168 hours. BNP (last 3 results) No results for input(s): PROBNP in the last 8760 hours. HbA1C: No results for input(s): HGBA1C in the last 72 hours. CBG: Recent Labs  Lab 08/20/18 1314 08/20/18 1605 08/20/18 2106 08/20/18 2241 08/21/18 0315  GLUCAP 149* 187* 218* 227* 133*    Lipid Profile: No results for input(s): CHOL, HDL, LDLCALC, TRIG, CHOLHDL, LDLDIRECT in the last 72 hours. Thyroid Function Tests: No results for input(s): TSH, T4TOTAL, FREET4, T3FREE, THYROIDAB in the last 72 hours. Anemia Panel: No results for input(s): VITAMINB12, FOLATE, FERRITIN, TIBC, IRON, RETICCTPCT in the last 72 hours. Urine analysis:    Component Value Date/Time   COLORURINE YELLOW 05/26/2015 1115   APPEARANCEUR CLEAR 05/26/2015 1115   APPEARANCEUR Hazy 07/30/2014 2303   LABSPEC 1.016 05/26/2015 1115   LABSPEC 1.012 07/30/2014 2303   PHURINE 8.0 05/26/2015 1115   GLUCOSEU 500 (A) 05/26/2015 1115   GLUCOSEU >=500 07/30/2014 2303   HGBUR MODERATE (A) 05/26/2015 1115   BILIRUBINUR NEGATIVE 05/26/2015 1115   BILIRUBINUR Negative 07/30/2014 2303   KETONESUR 15 (A) 05/26/2015 1115   PROTEINUR >300 (A) 05/26/2015 1115   UROBILINOGEN 0.2 05/26/2015 1115   NITRITE NEGATIVE 05/26/2015 1115  LEUKOCYTESUR NEGATIVE 05/26/2015 1115   LEUKOCYTESUR Negative 07/30/2014 2303       M.D. [facility] 08/21/2018, 10:24 AM  Pager: [phone] Between 7am to 7pm - call Pager - [phone]  After 7pm go to [email or web address] - password [email or web address]  Call night coverage person covering after 7pm

## 2018-08-21 NOTE — Progress Notes (Signed)
Paged by bedside RN regarding patient leaving the floor and going outside. Pt had previously been informed by bedside RN that he was not allowed to leave the floor. Pt refused to cooperate with nursing staff and therefore left. After he returned to his room I came and explained why we want him to stay on the floor with the [place] in place and the dangers of leaving with medical equipment attached. I explained he was free to walk the floor as long as he did not leave without permission and that him leaving and going off campus could be considered him leaving AMA. He has agree to stay on the floor and not leave. We will continue to monitor  Arby Barrette APCNP-BC, AGNP-C [facility] Pager [phone]

## 2018-08-22 ENCOUNTER — Encounter (HOSPITAL_COMMUNITY): Payer: Self-pay | Admitting: Podiatry

## 2018-08-22 LAB — BASIC METABOLIC PANEL
Anion gap: 9 (ref 5–15)
BUN: 23 mg/dL — ABNORMAL HIGH (ref 6–20)
CO2: 27 mmol/L (ref 22–32)
Calcium: 9 mg/dL (ref 8.9–10.3)
Chloride: 98 mmol/L (ref 98–111)
Creatinine, Ser: 7.29 mg/dL — ABNORMAL HIGH (ref 0.61–1.24)
GFR calc Af Amer: 10 mL/min — ABNORMAL LOW (ref >60)
GFR calc non Af Amer: 9 mL/min — ABNORMAL LOW (ref >60)
Glucose, Bld: 250 mg/dL — ABNORMAL HIGH (ref 70–99)
Potassium: 4.5 mmol/L (ref 3.5–5.1)
Sodium: 134 mmol/L — ABNORMAL LOW (ref 135–145)

## 2018-08-22 LAB — GLUCOSE, CAPILLARY
Glucose-Capillary: 202 mg/dL — ABNORMAL HIGH (ref 70–99)
Glucose-Capillary: 247 mg/dL — ABNORMAL HIGH (ref 70–99)
Glucose-Capillary: 275 mg/dL — ABNORMAL HIGH (ref 70–99)
Glucose-Capillary: 230 mg/dL — ABNORMAL HIGH (ref 70–99)
Glucose-Capillary: 175 mg/dL — ABNORMAL HIGH (ref 70–99)

## 2018-08-22 LAB — CBC
HCT: 26.7 % — ABNORMAL LOW (ref 39.0–52.0)
Hemoglobin: 8 g/dL — ABNORMAL LOW (ref 13.0–17.0)
MCH: 28.8 pg (ref 26.0–34.0)
MCHC: 30 g/dL (ref 30.0–36.0)
MCV: 96 fL (ref 80.0–100.0)
Platelets: 301 10*3/uL (ref 150–400)
RBC: 2.78 MIL/uL — ABNORMAL LOW (ref 4.22–5.81)
RDW: 14.6 % (ref 11.5–15.5)
WBC: 14.9 10*3/uL — ABNORMAL HIGH (ref 4.0–10.5)
nRBC: 0 % (ref 0.0–0.2)

## 2018-08-22 MED ORDER — INSULIN GLARGINE 100 UNIT/ML ~~LOC~~ SOLN
15.0000 [IU] | Freq: Every day | SUBCUTANEOUS | Status: DC
  Administered 2018-08-22: 15 [IU] via SUBCUTANEOUS
  Administered 2018-08-23: 7 [IU] via SUBCUTANEOUS
  Administered 2018-08-25: 15 [IU] via SUBCUTANEOUS
  Filled 2018-08-22 (×3): qty 0.15

## 2018-08-22 MED ORDER — DARBEPOETIN ALFA 100 MCG/0.5ML IJ SOSY: 100.0000 ug | PREFILLED_SYRINGE | INTRAMUSCULAR | Status: DC

## 2018-08-22 MED ORDER — SEVELAMER CARBONATE 800 MG PO TABS
3200.0000 mg | ORAL_TABLET | Freq: Three times a day (TID) | ORAL | Status: DC
  Administered 2018-08-22 – 2018-08-25 (×7): 3200 mg via ORAL
  Filled 2018-08-22 (×6): qty 4

## 2018-08-22 MED ORDER — PRO-STAT SUGAR FREE PO LIQD
30.0000 mL | Freq: Two times a day (BID) | ORAL | Status: DC
  Administered 2018-08-22 – 2018-08-24 (×2): 30 mL via ORAL
  Filled 2018-08-22 (×4): qty 30

## 2018-08-22 MED ORDER — SEVELAMER CARBONATE 800 MG PO TABS
3200.0000 mg | ORAL_TABLET | Freq: Three times a day (TID) | ORAL | Status: DC
  Filled 2018-08-22: qty 4

## 2018-08-22 NOTE — Progress Notes (Signed)
[facility]                                                                              Patient Demographics  [name], is a 37 y.o. male, DOB - 09-25-1981, WYO:[id number]  Admit date - 08/17/2018   Admitting Physician Janora Norlander, MD  Outpatient Primary MD for the patient is [name], Shane Rinks, MD  Outpatient specialists:   LOS - 5  days   Medical records reviewed and are as summarized below:    No chief complaint on file.      Brief summary   Patient is a 37 year old male with hypertension, ESRD on hemodialysis MWF, uncontrolled diabetes type 1, chronic diabetic left foot ulcer, was sent from podiatry office to the hospital on 11/20 for worsening of the left foot ulcer.  Patient had noted strong malodor with heavy drainage from the ulcer.  He had been using Santyl on the wound and last clinic visit or was on 11/15 and had been on clindamycin twice daily.  Assessment & Plan    Principal Problem: Left diabetic foot infection ([place]) with gangrene of the left foot -Patient presented with leukocytosis of 15.3, heavy drainage from his ulcer on the left foot  -Podiatry service, Dr. March Rummage was consulted, patient underwent I&D of the left fifth toe and transmetatarsal with partial left fifth toe and metatarsal amputation with wound VAC on 11/21 -MRI of the left foot showed cellulitis with osteomyelitis of the fifth metatarsal head, base of fifth proximal phalanx with severe surrounding edema. -Went back to the OR on 08/20/2018, underwent bone biopsy, metatarsal resection, secondary closure of surgical wound and wound VAC placement  -Wound cultures from 11/21 showing, with a few bacteroid vulgaris, beta-lactamase negative, sensitivity pending , continue Zosyn.  Vancomycin discontinued on 11/22 , antibiotics recommendation per ID,. -Podiatry is following, likely will need to go home with wound VAC, will await final recommendation.    Diabetes mellitus type 1 ([name]),  noncompliant, uncontrolled, insulin-dependent, with hyperglycemia, severely noncompliant -Hemoglobin A1c 7.3 -Patient has been recommended strongly not to take his own insulin, please see my detailed note on 08/19/2018.  Refuses carb modified diet. -BG remains uncontrolled, continue with insulin sliding scale, and pre-meal NovoLog, will increase his Lantus to 15 units subcu nightly     Diabetic gastroparesis ([name]) -Continue Zofran as needed, Reglan    HTN (hypertension) -BP stable, continue Norvasc    ESRD (end stage renal disease) ([name]) on hemodialysis -Nephrology consulted, on HD MWF, receiving HD per his schedule    Anemia of chronic disease -H&H, stable, follow CBC in a.m.  History of bipolar disorder, noncompliance -Continue Klonopin.  His mood varies from pleasant to using words of profanity and hostile  Code Status: Full CODE STATUS DVT Prophylaxis: Heparin subcu Family Communication: Discussed in detail with the patient, all imaging results, lab results explained to the patient.   Disposition Plan: Home when medically stable  Time Spent in minutes 25 minutes  Procedures:  08/18/2018 : I&D of left fifth toe, transmetatarsal with partial left fifth toe and metatarsal amputation, bone biopsy and wound VAC  08/20/2018:  1) Secondary closure of  surgical wound             2) Metatarsal resection             3) Bone biopsy             4) Application of Wound VAC  Hemodialysis  Consultants:   Podiatry Nephrology Infectious disease  Antimicrobials:   IV Zosyn   Medications  Scheduled Meds: . amLODipine  10 mg Oral Daily  . calcitRIOL  2.25 mcg Oral Q M,W,F-HD  . Chlorhexidine Gluconate Cloth  6 each Topical Q0600  . cinacalcet  30 mg Oral Q supper  . collagenase  1 application Topical Daily  . [START ON 08/26/2018] darbepoetin (ARANESP) injection - DIALYSIS  100 mcg Intravenous Q Fri-HD  . feeding supplement (PRO-STAT SUGAR FREE 64)  30 mL Oral BID   . heparin injection (subcutaneous)  5,000 Units Subcutaneous Q8H  . insulin aspart  0-5 Units Subcutaneous QHS  . insulin aspart  0-9 Units Subcutaneous TID WC  . insulin aspart  5 Units Subcutaneous TID WC  . insulin glargine  15 Units Subcutaneous QHS  . metoCLOPramide  5 mg Oral TID AC  . sevelamer carbonate  3,200 mg Oral TID WC   Continuous Infusions: . sodium chloride 10 mL/hr at 08/20/18 0948  . piperacillin-tazobactam (ZOSYN)  IV 3.375 g (08/22/18 1153)   PRN Meds:.acetaminophen, clonazePAM, morphine injection, ondansetron   Antibiotics   Anti-infectives (From admission, onward)   Start     Dose/Rate Route Frequency Ordered Stop   08/20/18 1120  vancomycin (VANCOCIN) powder  Status:  Discontinued       As needed 08/20/18 1121 08/20/18 1258   08/19/18 1200  vancomycin (VANCOCIN) IVPB 750 mg/150 ml premix  Status:  Discontinued     750 mg 150 mL/hr over 60 Minutes Intravenous Every M-W-F (Hemodialysis) 08/17/18 2321 08/19/18 1313   08/18/18 1350  vancomycin (VANCOCIN) powder  Status:  Discontinued       As needed 08/18/18 1410 08/18/18 1417   08/17/18 2330  vancomycin (VANCOCIN) 1,500 mg in sodium chloride 0.9 % 500 mL IVPB     1,500 mg 250 mL/hr over 120 Minutes Intravenous  Once 08/17/18 2319 08/18/18 0339   08/17/18 2245  piperacillin-tazobactam (ZOSYN) IVPB 3.375 g     3.375 g 12.5 mL/hr over 240 Minutes Intravenous Every 12 hours 08/17/18 2235          Subjective:   [name] was seen and examined today.  He denies any complaints today Objective:   Vitals:   08/21/18 1641 08/21/18 2058 08/22/18 0520 08/22/18 0735  BP: (!) 104/55 126/84 (!) 141/82 (!) 162/76  Pulse: 93 (!) 105 (!) 103 97  Resp: 18 18 18 18   Temp: 97.8 F (36.6 C) 98.6 F (37 C) 98.7 F (37.1 C) 99.6 F (37.6 C)  TempSrc: Oral Oral  Oral  SpO2: 100% 100% 99% 97%  Weight:  76.2 kg    Height:        Intake/Output Summary (Last 24 hours) at 08/22/2018 1440 Last data filed at  08/22/2018 1330 Gross per 24 hour  Intake 1520 ml  Output 0 ml  Net 1520 ml     Wt Readings from Last 3 Encounters:  08/21/18 76.2 kg  05/26/18 83.9 kg  05/25/18 83.9 kg   Physical Exam  Awake Alert, Oriented X 3, No new F.N deficits, Normal affect Symmetrical Chest wall movement, Good air movement bilaterally, CTAB RRR,No Gallops,Rubs or new Murmurs, No Parasternal  Heave +ve B.Sounds, Abd Soft, No tenderness, No rebound - guarding or rigidity. No Cyanosis, Clubbing or edema, left foot wrapped        Data Reviewed:  I have personally reviewed following labs and imaging studies  Micro Results Recent Results (from the past 240 hour(s))  MRSA PCR Screening     Status: None   Collection Time: 08/18/18  2:28 AM  Result Value Ref Range Status   MRSA by PCR NEGATIVE NEGATIVE Final    Comment:        The GeneXpert MRSA Assay (FDA approved for NASAL specimens only), is one component of a comprehensive MRSA colonization surveillance program. It is not intended to diagnose MRSA infection nor to guide or monitor treatment for MRSA infections. Performed at [facility], [place] [address], [place], [place] [address]   Surgical pcr screen     Status: None   Collection Time: 08/18/18 10:08 AM  Result Value Ref Range Status   MRSA, PCR NEGATIVE NEGATIVE Final   Staphylococcus aureus NEGATIVE NEGATIVE Final    Comment: (NOTE) The Xpert SA Assay (FDA approved for NASAL specimens in patients 69 years of age and older), is one component of a comprehensive surveillance program. It is not intended to diagnose infection nor to guide or monitor treatment. Performed at [facility], [place] [address] [place], [place] [address]   Fungus Culture With Stain     Status: None (Preliminary result)   Collection Time: 08/18/18  2:00 PM  Result Value Ref Range Status   Fungus Stain Final report  Final    Comment: (NOTE) Performed At: [facility] [place], [place] [id number] Rush Farmer MD [id number]    Fungus (Mycology) Culture PENDING  Incomplete   Fungal Source BONE  Final    Comment: LEFT 5TH Performed at [facility], [place] [address], [place], [place] [address]   Aerobic/Anaerobic Culture (surgical/deep wound)     Status: Abnormal (Preliminary result)   Collection Time: 08/18/18  2:00 PM  Result Value Ref Range Status   Specimen Description BONE LEFT 5TH  Final   Special Requests NONE  Final   Gram Stain   Final    RARE WBC PRESENT, PREDOMINANTLY PMN FEW GRAM NEGATIVE RODS    Culture (A)  Final    MULTIPLE ORGANISMS PRESENT, NONE PREDOMINANT RARE BACTEROIDES VULGATUS BETA LACTAMASE NEGATIVE Performed at [facility], [place] [address], [place], [place] [address]    Report Status PENDING  Incomplete  Fungus Culture Result     Status: None   Collection Time: 08/18/18  2:00 PM  Result Value Ref Range Status   Result 1 Comment  Final    Comment: (NOTE) KOH/Calcofluor preparation:  no fungus observed. Performed At: [facility] [place], [place] [id number] Rush Farmer MD [id number]   Fungus Culture With Stain     Status: None (Preliminary result)   Collection Time: 08/18/18  2:01 PM  Result Value Ref Range Status   Fungus Stain Final report  Final    Comment: (NOTE) Performed At: [facility] [place], [place] [id number] Rush Farmer MD [id number]    Fungus (Mycology) Culture PENDING  Incomplete   Fungal Source TISSUE  Final    Comment: LEFT 5TH Performed at [facility], [place] [address], [place], [place] [address]   Aerobic/Anaerobic Culture (surgical/deep wound)     Status: Abnormal (Preliminary result)   Collection Time:  08/18/18  2:01 PM  Result Value Ref Range Status   Specimen Description TISSUE LEFT 5TH  Final   Special Requests NONE  Final   Gram Stain NO WBC SEEN FEW GRAM NEGATIVE RODS   Final   Culture (A)  Final     MULTIPLE ORGANISMS PRESENT, NONE PREDOMINANT HOLDING FOR POSSIBLE ANAEROBE Performed at [facility], [place] [address], [name], [place] [address]    Report Status PENDING  Incomplete  Fungus Culture Result     Status: None   Collection Time: 08/18/18  2:01 PM  Result Value Ref Range Status   Result 1 Comment  Final    Comment: (NOTE) KOH/Calcofluor preparation:  no fungus observed. Performed At: [name] [place], [place] [id number] Rush Farmer MD [id number]     Radiology Reports Mr Foot Left Wo Contrast  Result Date: 08/18/2018 CLINICAL DATA:  Diabetic foot ulcer along the lateral forefoot. EXAM: MRI OF THE LEFT FOOT WITHOUT CONTRAST TECHNIQUE: Multiplanar, multisequence MR imaging of the left was performed. No intravenous contrast was administered. COMPARISON:  None. FINDINGS: Bones/Joint/Cartilage Soft tissue wound along the plantar lateral aspect of the fifth MTP joint extending to the cortex. Cortical destruction with severe bone marrow edema in the base of the fifth proximal phalanx and fifth metatarsal head most consistent with osteomyelitis. Mild marrow edema in the fifth middle phalanx which may be reactive versus secondary to osteomyelitis. No other marrow signal abnormality. No fracture or dislocation. Normal alignment. No joint effusion. Ligaments Lisfranc ligament is intact.  Collateral ligaments are intact. Muscles and Tendons Flexor, peroneal and extensor compartment tendons are intact. Mild muscle atrophy. Soft tissue No fluid collection or hematoma. No soft tissue mass. Soft tissue edema in the lateral forefoot extending into the fifth digit most consistent with cellulitis. Small focus of low signal in the soft tissues along the medial aspect of the fifth digit likely reflecting a small amount of air. IMPRESSION: 1. Soft tissue wound along the plantar lateral aspect of the fifth MTP joint with surrounding cellulitis. Osteomyelitis of the  fifth metatarsal head and base of the fifth proximal phalanx with severe surrounding marrow edema. Electronically Signed   By: Kathreen Devoid   On: 08/18/2018 10:08   Dg Foot 2 Views Left  Result Date: 08/20/2018 CLINICAL DATA:  S/p 5th metatarsal osteomyelitis resection. EXAM: LEFT FOOT - 2 VIEW COMPARISON:  08/19/2017 FINDINGS: Resection of a portion of the remaining 5th metatarsal noted. Soft tissue postoperative changes identified. No acute bony abnormality otherwise noted. Vascular calcifications are identified. IMPRESSION: Further resection of portion of the remaining 5th metatarsal. Electronically Signed   By: Margarette Canada M.D.   On: 08/20/2018 15:26   Dg Foot 2 Views Left  Result Date: 08/19/2018 CLINICAL DATA:  S/P IRRIGATION AND DEBRIDEMENT OF LEFT 5TH TOE AND TRANSMETATARSAL, WITH PARTICAL LEFT 5TH TOE AND METATARSAL AMPUTATION, BONE BIOPSY, WOUND VAC APPLICATION FROM YESTERDAY. EXAM: LEFT FOOT - 2 VIEW COMPARISON:  Plain film of the LEFT foot dated 08/11/2018. MRI of the LEFT foot dated 08/18/2018 FINDINGS: Surgical changes of fifth metatarsal amputation, with metatarsal base remaining. Expected postsurgical changes within the overlying soft tissues. No abnormal soft tissue air. Remaining osseous structures appear intact and normal in mineralization. IMPRESSION: Status post fifth metatarsal amputation. Expected postsurgical changes within the overlying soft tissues. No evidence of surgical complicating feature. Electronically Signed   By: Franki Cabot M.D.   On: 08/19/2018 19:34   Dg Foot Complete Left  Result  Date: 08/11/2018 Please see detailed radiograph report in office note.  Dg Foot Complete Left  Result Date: 08/04/2018 Please see detailed radiograph report in office note.   Lab Data:  CBC: Recent Labs  Lab 08/18/18 0719 08/19/18 0534 08/20/18 0459 08/21/18 0800 08/22/18 0816  WBC 14.0* 17.9* 9.9 15.8* 14.9*  HGB 9.3* 8.3* 10.8* 8.0* 8.0*  HCT 29.8* 26.4* 35.2*  26.2* 26.7*  MCV 92.5 93.0 94.4 96.0 96.0  PLT 261 237 238 291 856   Basic Metabolic Panel: Recent Labs  Lab 08/18/18 0719 08/19/18 0534 08/20/18 0459 08/21/18 0731 08/22/18 0816  NA 131* 129* 134* 132* 134*  K 4.7 5.4* 4.2 5.0 4.5  CL 92* 91* 96* 96* 98  CO2 25 25 26 25 27   GLUCOSE 188* 289* 84 221* 250*  BUN 47* 58* 26* 37* 23*  CREATININE 10.45* 12.34* 7.71* 8.90* 7.29*  CALCIUM 8.8* 8.6* 8.9 8.6* 9.0  PHOS 4.9*  --   --  6.4*  --    GFR: Estimated Creatinine Clearance: 12.4 mL/min (A) (by C-G formula based on SCr of 7.29 mg/dL (H)). Liver Function Tests: Recent Labs  Lab 08/18/18 0719 08/21/18 0731  ALBUMIN 2.6* 2.3*   No results for input(s): LIPASE, AMYLASE in the last 168 hours. No results for input(s): AMMONIA in the last 168 hours. Coagulation Profile: No results for input(s): INR, PROTIME in the last 168 hours. Cardiac Enzymes: No results for input(s): CKTOTAL, CKMB, CKMBINDEX, TROPONINI in the last 168 hours. BNP (last 3 results) No results for input(s): PROBNP in the last 8760 hours. HbA1C: No results for input(s): HGBA1C in the last 72 hours. CBG: Recent Labs  Lab 08/21/18 1640 08/21/18 2100 08/21/18 2249 08/22/18 0732 08/22/18 1111  GLUCAP 294* 442* 291* 247* 275*   Lipid Profile: No results for input(s): CHOL, HDL, LDLCALC, TRIG, CHOLHDL, LDLDIRECT in the last 72 hours. Thyroid Function Tests: No results for input(s): TSH, T4TOTAL, FREET4, T3FREE, THYROIDAB in the last 72 hours. Anemia Panel: No results for input(s): VITAMINB12, FOLATE, FERRITIN, TIBC, IRON, RETICCTPCT in the last 72 hours. Urine analysis:    Component Value Date/Time   COLORURINE YELLOW 05/26/2015 1115   APPEARANCEUR CLEAR 05/26/2015 1115   APPEARANCEUR Hazy 07/30/2014 2303   LABSPEC 1.016 05/26/2015 1115   LABSPEC 1.012 07/30/2014 2303   PHURINE 8.0 05/26/2015 1115   GLUCOSEU 500 (A) 05/26/2015 1115   GLUCOSEU >=500 07/30/2014 2303   HGBUR MODERATE (A) 05/26/2015  1115   BILIRUBINUR NEGATIVE 05/26/2015 1115   BILIRUBINUR Negative 07/30/2014 2303   KETONESUR 15 (A) 05/26/2015 1115   PROTEINUR >300 (A) 05/26/2015 1115   UROBILINOGEN 0.2 05/26/2015 1115   NITRITE NEGATIVE 05/26/2015 1115   LEUKOCYTESUR NEGATIVE 05/26/2015 1115   LEUKOCYTESUR Negative 07/30/2014 2303     Emeline Gins Zulay Corrie M.D. [facility] 08/22/2018, 2:40 PM  Pager: [phone] Between 7am to 7pm - call Pager - [phone]  After 7pm go to [email or web address] - password [email or web address]  Call night coverage person covering after 7pm

## 2018-08-22 NOTE — Care Management Important Message (Signed)
Important Message  Patient Details  Name: [name] MRN: [id number] Date of Birth: 04-24-1981   Medicare Important Message Given:  Yes    Erenest Rasher, RN 08/22/2018, 2:37 PM

## 2018-08-22 NOTE — Consult Note (Signed)
   [facility] CM Inpatient Consult   08/22/2018  LAURA RADILLA 10/25/1980 [id number]    Patient screened for potential [facility] Care Management services due to unplanned readmission risk score of 25% (high).  Went to bedside to speak with Mr. Rosier to offer [place] Management services. He pleasantly declined [facility] Care Management follow up.   Left [facility] Care Management brochure with contact information to call should he change his mind.  Notification sent to inpatient RNCM to make aware [place] Management services were declined.    Marthenia Rolling, MSN-Ed, RN,BSN [facility] Liaison [phone]

## 2018-08-22 NOTE — Consult Note (Signed)
[place] Nurse wound consult note Patient seen in [facility] 5M20.  No family in room. I explained to the patient that I was there to place VAC black foam into the wound bed and over the incision, then connect it to the hospital VAC machine, not the [employer].  The patient refused this therapy.  He stated he had male visitors that seen "that mess" and "I have to look at at and it makes me sick.  No, I will not agree to that".  I then presented the alternative plan of placing a saline moistened gauze over the wound and changing it twice daily.  He agreed to that. I will place these orders into his record. Monitor the wound area(s) for worsening of condition such as: Signs/symptoms of infection,  Increase in size,  Development of or worsening of odor, Development of pain, or increased pain at the affected locations.  Notify the medical team if any of these develop.  Thank you for the consult.  Discussed plan of care with the patient and bedside nurse.  [place] nurse will not follow at this time.  Please re-consult the [place] team if needed.  Val Riles, RN, MSN, CWOCN, CNS-BC, pager [phone]

## 2018-08-22 NOTE — Progress Notes (Addendum)
[facility] Progress Note   Subjective: Sitting up at bedside eating. Angry because he was sent fat free mayo. No other C/Os.   Objective Vitals:   08/21/18 1641 08/21/18 2058 08/22/18 0520 08/22/18 0735  BP: (!) 104/55 126/84 (!) 141/82 (!) 162/76  Pulse: 93 (!) 105 (!) 103 97  Resp: 18 18 18 18   Temp: 97.8 F (36.6 C) 98.6 F (37 C) 98.7 F (37.1 C) 99.6 F (37.6 C)  TempSrc: Oral Oral  Oral  SpO2: 100% 100% 99% 97%  Weight:  76.2 kg    Height:       Physical Exam General: WN, WD NAD Heart: S1,S2 RRR Lungs: CTAB Abdomen: S, NT Extremities: No LE edema. Ace wrap L foot.  Dialysis Access: L AVG + bruit  Additional Objective Labs: Basic Metabolic Panel: Recent Labs  Lab 08/18/18 0719  08/20/18 0459 08/21/18 0731 08/22/18 0816  NA 131*   < > 134* 132* 134*  K 4.7   < > 4.2 5.0 4.5  CL 92*   < > 96* 96* 98  CO2 25   < > 26 25 27   GLUCOSE 188*   < > 84 221* 250*  BUN 47*   < > 26* 37* 23*  CREATININE 10.45*   < > 7.71* 8.90* 7.29*  CALCIUM 8.8*   < > 8.9 8.6* 9.0  PHOS 4.9*  --   --  6.4*  --    < > = values in this interval not displayed.   Liver Function Tests: Recent Labs  Lab 08/18/18 0719 08/21/18 0731  ALBUMIN 2.6* 2.3*   No results for input(s): LIPASE, AMYLASE in the last 168 hours. CBC: Recent Labs  Lab 08/18/18 0719 08/19/18 0534 08/20/18 0459 08/21/18 0800 08/22/18 0816  WBC 14.0* 17.9* 9.9 15.8* 14.9*  HGB 9.3* 8.3* 10.8* 8.0* 8.0*  HCT 29.8* 26.4* 35.2* 26.2* 26.7*  MCV 92.5 93.0 94.4 96.0 96.0  PLT 261 237 238 291 301   Blood Culture    Component Value Date/Time   SDES TISSUE LEFT 5TH 08/18/2018 1401   SPECREQUEST NONE 08/18/2018 1401   CULT  08/18/2018 1401    HOLDING FOR POSSIBLE ANAEROBE Performed at [facility] [address], [place], [place] [address]    REPTSTATUS PENDING 08/18/2018 1401    Cardiac Enzymes: No results for input(s): CKTOTAL, CKMB, CKMBINDEX, TROPONINI in the last 168  hours. CBG: Recent Labs  Lab 08/21/18 1640 08/21/18 2100 08/21/18 2249 08/22/18 0732 08/22/18 1111  GLUCAP 294* 442* 291* 247* 275*   Iron Studies: No results for input(s): IRON, TIBC, TRANSFERRIN, FERRITIN in the last 72 hours. @lablastinr3 @ Studies/Results: Dg Foot 2 Views Left  Result Date: 08/20/2018 CLINICAL DATA:  S/p 5th metatarsal osteomyelitis resection. EXAM: LEFT FOOT - 2 VIEW COMPARISON:  08/19/2017 FINDINGS: Resection of a portion of the remaining 5th metatarsal noted. Soft tissue postoperative changes identified. No acute bony abnormality otherwise noted. Vascular calcifications are identified. IMPRESSION: Further resection of portion of the remaining 5th metatarsal. Electronically Signed   By: Margarette Canada M.D.   On: 08/20/2018 15:26   Medications: . sodium chloride 10 mL/hr at 08/20/18 0948  . piperacillin-tazobactam (ZOSYN)  IV 3.375 g (08/22/18 1153)   . amLODipine  10 mg Oral Daily  . calcitRIOL  2.25 mcg Oral Q M,W,F-HD  . Chlorhexidine Gluconate Cloth  6 each Topical Q0600  . cinacalcet  30 mg Oral Q supper  . collagenase  1 application Topical Daily  .  darbepoetin (ARANESP) injection - DIALYSIS  40 mcg Intravenous Q Fri-HD  . heparin injection (subcutaneous)  5,000 Units Subcutaneous Q8H  . insulin aspart  0-5 Units Subcutaneous QHS  . insulin aspart  0-9 Units Subcutaneous TID WC  . insulin aspart  5 Units Subcutaneous TID WC  . insulin glargine  10 Units Subcutaneous QHS  . metoCLOPramide  5 mg Oral TID AC  . sevelamer carbonate  3,200 mg Oral BID WC   HD orders: [facility] MWF 4 hrs 180NRe 400/800 75.5 kg 2.0 K/ 2.0 Ca  L AVG -Heparin 5000 units IV TIW -Calcitriol 2.5 mcg PO TIW  Assessment/Plan: 1. L Foot ulcer with osteomyelitis: S/P 5th toe amp and wound vac 11/21. To OR 08/20/18 for bone bx/I & D. ID consulted. On Zosyn.  2. ESRD -MWF via AVG. HD tomorrow on holiday schedule. K+ 4.5. Hold heparin.  3. Anemia - HGB 8.0. Persistent downward trend.  Increase Aranesp dose to 100 mcg IV.  4. Secondary hyperparathyroidism - Continue binders, VDRA. Ca 9.0 C Ca 10.4 Decrease VDRA. Phos 6.4.  5. HTN/volume - BP variable. HD 11/24 on holiday schedule. Pre wt 81 kgs Net UF 5 liters Post wt 76.2 kg. Push to OP EDW tomorrow.  6. Nutrition - Albumin 2.6 Renal/Carb mod diet, add prostat, renal vits. 7. DMT1-per primary  [name]. Brown NP-C 08/22/2018, 12:15 PM  [facility] [phone]  Pt seen, examined and agree w A/P as above.  Kelly Splinter MD [employer] pager [phone]   08/22/2018, 2:47 PM

## 2018-08-22 NOTE — Progress Notes (Signed)
Subjective:  Patient ID: [name], male    DOB: [birth year],  MRN: [id number]  Seen at bedside. No pedal complaints. Per patient he has a meeting today with the staff about some of his "concerns" regarding his care during his stay.  Of note, odor of marijuana noted upon entering the room.  Objective:   Vitals:   08/21/18 2058 08/22/18 0520  BP: 126/84 (!) 141/82  Pulse: (!) 105 (!) 103  Resp: 18 18  Temp: 98.6 F (37 C) 98.7 F (37.1 C)  SpO2: 100% 99%   General AA&O x3. Normal mood and affect.  Vascular Foot warm and well perfused.  Neurologic Epicritic sensation grossly absent.  Dermatologic [place] removed. Wound healing well Incision slightly macerated but coapted with intact suture Open area distally granulating in. No purulence. SS drainage only. No warmth or erythema. No signs of acute infection.   Orthopedic: Motor intact distally.   Results for orders placed or performed during the hospital encounter of 08/17/18 (from the past 24 hour(s))  CBC     Status: Abnormal   Collection Time: 08/21/18  8:00 AM  Result Value Ref Range   WBC 15.8 (H) 4.0 - 10.5 K/uL   RBC 2.73 (L) 4.22 - 5.81 MIL/uL   Hemoglobin 8.0 (L) 13.0 - 17.0 g/dL   HCT 26.2 (L) 39.0 - 52.0 %   MCV 96.0 80.0 - 100.0 fL   MCH 29.3 26.0 - 34.0 pg   MCHC 30.5 30.0 - 36.0 g/dL   RDW 14.6 11.5 - 15.5 %   Platelets 291 150 - 400 K/uL   nRBC 0.0 0.0 - 0.2 %  Glucose, capillary     Status: Abnormal   Collection Time: 08/21/18 11:19 AM  Result Value Ref Range   Glucose-Capillary 143 (H) 70 - 99 mg/dL  Glucose, capillary     Status: Abnormal   Collection Time: 08/21/18  4:40 PM  Result Value Ref Range   Glucose-Capillary 294 (H) 70 - 99 mg/dL  Glucose, capillary     Status: Abnormal   Collection Time: 08/21/18  9:00 PM  Result Value Ref Range   Glucose-Capillary 442 (H) 70 - 99 mg/dL  Glucose, capillary     Status: Abnormal   Collection Time: 08/21/18 10:49 PM  Result Value Ref Range   Glucose-Capillary 291 (H) 70 - 99 mg/dL  Glucose, capillary     Status: Abnormal   Collection Time: 08/22/18  7:32 AM  Result Value Ref Range   Glucose-Capillary 247 (H) 70 - 99 mg/dL   Results for orders placed or performed during the hospital encounter of 08/17/18  MRSA PCR Screening     Status: None   Collection Time: 08/18/18  2:28 AM  Result Value Ref Range Status   MRSA by PCR NEGATIVE NEGATIVE Final    Comment:        The GeneXpert MRSA Assay (FDA approved for NASAL specimens only), is one component of a comprehensive MRSA colonization surveillance program. It is not intended to diagnose MRSA infection nor to guide or monitor treatment for MRSA infections. Performed at [facility], [place] [address] [place], [place] [address]   Surgical pcr screen     Status: None   Collection Time: 08/18/18 10:08 AM  Result Value Ref Range Status   MRSA, PCR NEGATIVE NEGATIVE Final   Staphylococcus aureus NEGATIVE NEGATIVE Final    Comment: (NOTE) The Xpert SA Assay (FDA approved for NASAL specimens in patients 50 years of age and  older), is one component of a comprehensive surveillance program. It is not intended to diagnose infection nor to guide or monitor treatment. Performed at [facility], [place] [address], [place], [place] [address]   Fungus Culture With Stain     Status: None (Preliminary result)   Collection Time: 08/18/18  2:00 PM  Result Value Ref Range Status   Fungus Stain Final report  Final    Comment: (NOTE) Performed At: [facility] [place], [place] [id number] Rush Farmer MD [id number]    Fungus (Mycology) Culture PENDING  Incomplete   Fungal Source BONE  Final    Comment: LEFT 5TH Performed at [facility], [place] [address], [place], [place] [address]   Aerobic/Anaerobic Culture (surgical/deep wound)     Status: None (Preliminary result)   Collection Time: 08/18/18  2:00 PM  Result Value Ref Range Status    Specimen Description BONE LEFT 5TH  Final   Special Requests NONE  Final   Gram Stain   Final    RARE WBC PRESENT, PREDOMINANTLY PMN FEW GRAM NEGATIVE RODS    Culture   Final    HOLDING FOR POSSIBLE ANAEROBE Performed at [facility], [place] [address] [place], [place] [address]    Report Status PENDING  Incomplete  Fungus Culture Result     Status: None   Collection Time: 08/18/18  2:00 PM  Result Value Ref Range Status   Result 1 Comment  Final    Comment: (NOTE) KOH/Calcofluor preparation:  no fungus observed. Performed At: [facility] [place], [place] [id number] Rush Farmer MD [id number]   Fungus Culture With Stain     Status: None (Preliminary result)   Collection Time: 08/18/18  2:01 PM  Result Value Ref Range Status   Fungus Stain Final report  Final    Comment: (NOTE) Performed At: [facility] [place], [place] [id number] Rush Farmer MD [id number]    Fungus (Mycology) Culture PENDING  Incomplete   Fungal Source TISSUE  Final    Comment: LEFT 5TH Performed at [facility], [place] [address] [place], [place] [address]   Aerobic/Anaerobic Culture (surgical/deep wound)     Status: None (Preliminary result)   Collection Time: 08/18/18  2:01 PM  Result Value Ref Range Status   Specimen Description TISSUE LEFT 5TH  Final   Special Requests NONE  Final   Gram Stain NO WBC SEEN FEW GRAM NEGATIVE RODS   Final   Culture   Final    HOLDING FOR POSSIBLE ANAEROBE Performed at [facility], [address] [place], [place] [address]    Report Status PENDING  Incomplete  Fungus Culture Result     Status: None   Collection Time: 08/18/18  2:01 PM  Result Value Ref Range Status   Result 1 Comment  Final    Comment: (NOTE) KOH/Calcofluor preparation:  no fungus observed. Performed At: [facility] [place], [place] [id number] Rush Farmer MD TO:[id number]     Assessment &  Plan:  Patient was evaluated and treated and all questions answered.  Diabetic foot infection L foot. -Labs reviewed. No WBC yet today reported. -Micro pending. GNRs on GS. Holding for anaerobe. -Path pending. -Continue abx. ID Following. Appreciate reccs. -Nursing notes show severe non-compliance, wildly out of control sugars. Again concern these will impede healing. -Dressing removed. Wound healing well. Discussed with patient the importance of continued VAC therapy. Patient  acquiesced to continued use. Will consult wound care team to reapply [facility] today. -Discussed strict WB only in the surgical shoe. Patient states he will try to be more compliant. -Wound VAC to stay on until his appointment on Wednesday  Likely ok for discharge assuming WBC normalized, antibiotic reccs finalized  Podiatry will follow.

## 2018-08-22 NOTE — Consult Note (Signed)
[place] Nurse wound consult note Patient receiving care in [id number].  [facility] team received consult for black VAC foam into open wound of foot, Xeroform over sutures and black foam, all connected to [place].  I spoke with Sharman Cheek, representative for [facility] product.  According to Mr. Stann Mainland, the [facility] combined with a black foam dressing will not deliver optimal VAC therapy.  I explained this to Dr. March Rummage.  I offered him the option to apply Mepitel over the sutures, black foam over the sutures and in the open wound, all connected to our hospital VAC.  He agreed to this plan.  He requested I explain to the patient when I go in to discuss the therapy, that the patient will NOT be going home on [facility] therapy.  The back up plan, should the patient refuse our hospital VAC machine and therapy, is a saline moistened gauze to the open wound twice daily. Val Riles, RN, MSN, CWOCN, CNS-BC, pager [phone]

## 2018-08-22 NOTE — Progress Notes (Signed)
[facility] for Infectious Disease    Date of Admission:  08/17/2018   Total days of antibiotics 6-piptazo  ID: Shane [name] is a 37 y.o. 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 amLODipine  10 mg Oral Daily  . calcitRIOL  2.25 mcg Oral Q M,W,F-HD  . Chlorhexidine Gluconate Cloth  6 each Topical Q0600  . cinacalcet  30 mg Oral Q supper  . collagenase  1 application Topical Daily  . [START ON 08/26/2018] darbepoetin (ARANESP) injection - DIALYSIS  100 mcg Intravenous Q Fri-HD  . feeding supplement (PRO-STAT SUGAR FREE 64)  30 mL Oral BID  . heparin injection (subcutaneous)  5,000 Units Subcutaneous Q8H  . insulin aspart  0-5 Units Subcutaneous QHS  . insulin aspart  0-9 Units Subcutaneous TID WC  . insulin aspart  5 Units Subcutaneous TID WC  . insulin glargine  15 Units Subcutaneous QHS  . metoCLOPramide  5 mg Oral TID AC  . sevelamer carbonate  3,200 mg Oral TID WC    Objective: Vital signs in last 24 hours: Temp:  [98.6 F (37 C)-99.6 F (37.6 C)] 99.6 F (37.6 C) (11/25 0735) Pulse Rate:  [97-105] 97 (11/25 0735) Resp:  [18] 18 (11/25 0735) BP: (126-162)/(76-84) 162/76 (11/25 0735) SpO2:  [97 %-100 %] 97 % (11/25 0735) Weight:  [76.2 kg] 76.2 kg (11/24 2058) Physical Exam  Constitutional: He is oriented to  person, place, and time. He appears well-developed and well-nourished. No distress.  HENT:  Mouth/Throat: Oropharynx is clear and moist. No oropharyngeal exudate.  Cardiovascular: Normal rate, regular rhythm and normal heart sounds. Exam reveals no gallop and no friction rub.  No murmur heard.  Pulmonary/Chest: Effort normal and breath sounds normal. No respiratory distress. He has no wheezes.  Abdominal: Soft. Bowel sounds are normal. He exhibits no distension. There is no tenderness.  OZD:GUYQ foot bandaged, left arm-upper extremity bandaged from HD Neurological: He is alert and oriented to person, place, and time.  Skin: Skin is warm and dry. No rash noted. No erythema.  Psychiatric: He has a normal mood and affect. His behavior is normal.    Lab Results Recent Labs    08/21/18 0731 08/21/18 0800 08/22/18 0816  WBC  --  15.8* 14.9*  HGB  --  8.0* 8.0*  HCT  --  26.2* 26.7*  NA 132*  --  134*  K 5.0  --  4.5  CL 96*  --  98  CO2 25  --  27  BUN 37*  --  23*  CREATININE 8.90*  --  7.29*   Liver Panel Recent Labs    08/21/18 0731  ALBUMIN 2.3*   Lab Results  Component Value Date   ESRSEDRATE 78 (H) 08/18/2018    Microbiology: -bacteroides  Studies/Results: No results found.   Assessment/Plan: Left foot osteomyelitis = bone cx sent to see if clean margins had + growth with bacteroides. Patient is currently on piptazo. Difficult to say if all infected area has been excised. Would recommend to treat "mop-up" course but will discuss with surgery to see what is remaining from recent surgery  [facility] for Infectious Diseases Cell: [phone] Pager: [phone]  08/22/2018, 6:26 PM

## 2018-08-23 LAB — AEROBIC/ANAEROBIC CULTURE W GRAM STAIN (SURGICAL/DEEP WOUND)

## 2018-08-23 LAB — GLUCOSE, CAPILLARY
Glucose-Capillary: 145 mg/dL — ABNORMAL HIGH (ref 70–99)
Glucose-Capillary: 255 mg/dL — ABNORMAL HIGH (ref 70–99)
Glucose-Capillary: 229 mg/dL — ABNORMAL HIGH (ref 70–99)
Glucose-Capillary: 184 mg/dL — ABNORMAL HIGH (ref 70–99)
Glucose-Capillary: 96 mg/dL (ref 70–99)

## 2018-08-23 LAB — AEROBIC/ANAEROBIC CULTURE (SURGICAL/DEEP WOUND): Gram Stain: NONE SEEN

## 2018-08-23 MED ORDER — INSULIN GLARGINE 100 UNIT/ML ~~LOC~~ SOLN: 15.0000 [IU] | Freq: Every day | SUBCUTANEOUS | 1 refills | Status: DC

## 2018-08-23 MED ORDER — SODIUM CHLORIDE 0.9 % IV SOLN: 100.0000 mL | INTRAVENOUS | Status: DC | PRN

## 2018-08-23 MED ORDER — AMOXICILLIN-POT CLAVULANATE 500-125 MG PO TABS: 1.0000 | ORAL_TABLET | Freq: Every day | ORAL | 0 refills | Status: DC

## 2018-08-23 MED ORDER — LACTINEX PO CHEW: 1.0000 | CHEWABLE_TABLET | Freq: Three times a day (TID) | ORAL | 0 refills | Status: DC

## 2018-08-23 MED ORDER — LIDOCAINE HCL (PF) 1 % IJ SOLN: 5.0000 mL | INTRAMUSCULAR | Status: DC | PRN

## 2018-08-23 MED ORDER — SODIUM CHLORIDE 0.9 % IV SOLN
3.0000 g | Freq: Two times a day (BID) | INTRAVENOUS | Status: DC
  Administered 2018-08-23 – 2018-08-25 (×3): 3 g via INTRAVENOUS
  Filled 2018-08-23 (×5): qty 3

## 2018-08-23 MED ORDER — SODIUM CHLORIDE 0.9 % IV SOLN
3.0000 g | Freq: Two times a day (BID) | INTRAVENOUS | Status: DC
  Filled 2018-08-23 (×2): qty 3

## 2018-08-23 MED ORDER — COLLAGENASE 250 UNIT/GM EX OINT: 1.0000 "application " | TOPICAL_OINTMENT | Freq: Every day | CUTANEOUS | 0 refills | Status: DC

## 2018-08-23 MED ORDER — PENTAFLUOROPROP-TETRAFLUOROETH EX AERO: 1.0000 "application " | INHALATION_SPRAY | CUTANEOUS | Status: DC | PRN

## 2018-08-23 MED ORDER — PRO-STAT SUGAR FREE PO LIQD: 30.0000 mL | Freq: Two times a day (BID) | ORAL | 0 refills | Status: DC

## 2018-08-23 MED ORDER — LIDOCAINE-PRILOCAINE 2.5-2.5 % EX CREA: 1.0000 "application " | TOPICAL_CREAM | CUTANEOUS | Status: DC | PRN

## 2018-08-23 MED FILL — SANTYL OINTMENT: 250 | 30 days supply | Qty: 30 | Fill #0

## 2018-08-23 MED FILL — AMOX-CLAV 500-125 MG TABLET: 500-125 | 28 days supply | Qty: 28 | Fill #0

## 2018-08-23 NOTE — Progress Notes (Signed)
Pharmacy Antibiotic Note  [name] is a 37 y.o. male admitted on 08/17/2018 with diabetic foot infection.  Pharmacy has been consulted for Zosyn dosing. Direct admit from [facility].  Pt has ESRD on HD MWF. S/p L foot I&D, wound closure Tissue from OR, bacteroides vulgatus.   ID consulted.  ID said cont zosyn  Afeb , wbc 14.9   11/21: Soft tissue culture: Bacteroides vulgatus 11/21 Bone tissue culture: Bacteroides vulgatus 11/21 MRSA PCR Negative   Plan: Continue Zosyn 3.375G IV q12h to be infused over 4 hours Trend WBC, temp F/u cultures.    Temp (24hrs), Avg:98.4 F (36.9 C), Min:98.3 F (36.8 C), Max:98.5 F (36.9 C)  No Known Allergies   Danaja Lasota A. Levada Dy, PharmD, [place] Pager: [phone] Please utilize Amion for appropriate phone number to reach the unit pharmacist ([place])   08/23/2018 8:09 AM

## 2018-08-23 NOTE — Progress Notes (Signed)
[facility]                                                                              Patient Demographics  [name], is a 37 y.o. male, DOB - 02-Oct-1980, UXN:[id number]  Admit date - 08/17/2018   Admitting Physician Janora Norlander, MD  Outpatient Primary MD for the patient is [name], [name], MD  Outpatient specialists:   LOS - 6  days   Medical records reviewed and are as summarized below:    No chief complaint on file.      Brief summary   Patient is a 37 year old male with hypertension, ESRD on hemodialysis MWF, uncontrolled diabetes type 1, chronic diabetic left foot ulcer, was sent from podiatry office to the hospital on 11/20 for worsening of the left foot ulcer.  Patient had noted strong malodor with heavy drainage from the ulcer.  He had been using Santyl on the wound and last clinic visit or was on 11/15 and had been on clindamycin twice daily.  Patient admitted with infected left foot, he went for left fifth metatarsal and great toe partial amputation by podiatry, seen by ID for antibiotics recommendation, patient discharged 08/23/2018 was held secondary to clotted AV graft.  Assessment & Plan     Left diabetic foot infection ([place]) with gangrene of the left foot -Patient presented with leukocytosis of 15.3, heavy drainage from his ulcer on the left foot  -MRI of the left foot showed cellulitis with osteomyelitis of the fifth metatarsal head, base of fifth proximal phalanx with severe surrounding edema. -Went back to the OR on 08/20/2018, underwent bone biopsy, metatarsal resection, secondary closure of surgical wound and wound VAC placement  -Wound cultures from 11/21 showing, with a few bacteroid vulgaris, beta-lactamase negative, sensitivity pending , continue Zosyn.  Vancomycin discontinued on 11/22 ,discussed antibiotics with ID, recommendation for 4 weeks of Augmentin on discharge, to be kept on Zosyn during hospital stay. -Podiatry  service, Dr. March Rummage was consulted, patient underwent I&D of the left fifth toe and transmetatarsal with partial left fifth toe and metatarsal amputation with wound VAC on 11/21, currently wound VAC has been discontinued, discussed with Dr. March Rummage, patient be discharged with no wound VAC, to follow in his office regarding further wound recommendation.    Diabetes mellitus type 1 ([place]), noncompliant, uncontrolled, insulin-dependent, with hyperglycemia, severely noncompliant -Hemoglobin A1c 7.3 -Patient has been recommended strongly not to take his own insulin, please see my detailed note on 08/19/2018.  Refuses carb modified diet. -BG remains uncontrolled, continue with insulin sliding scale, and pre-meal NovoLog, will increase his Lantus to 15 units subcu nightly     Diabetic gastroparesis ([name]) -Continue Zofran as needed, Reglan    HTN (hypertension) -BP stable, continue Norvasc    ESRD (end stage renal disease) ([place]) on hemodialysis -Nephrology consulted, on HD MWF, receiving HD per his schedule -Patient with clotted AV graft, was unable to be dialyzed today, plan for IR to declot AV graft tomorrow    Anemia of chronic disease -H&H, stable, follow CBC in a.m.  History of bipolar disorder, noncompliance -Continue Klonopin.  His mood varies from pleasant to  using words of profanity and hostile  Code Status: Full CODE STATUS DVT Prophylaxis: Heparin subcu Family Communication: Discussed in detail with the patient, all imaging results, lab results explained to the patient.   Disposition Plan: Home when medically stable  Time Spent in minutes 25 minutes  Procedures:  08/18/2018 : I&D of left fifth toe, transmetatarsal with partial left fifth toe and metatarsal amputation, bone biopsy and wound VAC  08/20/2018:  1) Secondary closure of surgical wound             2) Metatarsal resection             3) Bone biopsy             4) Application of Wound  VAC  Hemodialysis  Consultants:   Podiatry Nephrology Infectious disease  Antimicrobials:   IV Zosyn   Medications  Scheduled Meds: . amLODipine  10 mg Oral Daily  . calcitRIOL  2.25 mcg Oral Q M,W,F-HD  . Chlorhexidine Gluconate Cloth  6 each Topical Q0600  . cinacalcet  30 mg Oral Q supper  . collagenase  1 application Topical Daily  . [START ON 08/26/2018] darbepoetin (ARANESP) injection - DIALYSIS  100 mcg Intravenous Q Fri-HD  . feeding supplement (PRO-STAT SUGAR FREE 64)  30 mL Oral BID  . heparin injection (subcutaneous)  5,000 Units Subcutaneous Q8H  . insulin aspart  0-5 Units Subcutaneous QHS  . insulin aspart  0-9 Units Subcutaneous TID WC  . insulin aspart  5 Units Subcutaneous TID WC  . insulin glargine  15 Units Subcutaneous QHS  . metoCLOPramide  5 mg Oral TID AC  . sevelamer carbonate  3,200 mg Oral TID WC   Continuous Infusions: . sodium chloride 10 mL/hr at 08/20/18 0948  . ampicillin-sulbactam (UNASYN) IV     PRN Meds:.acetaminophen, clonazePAM, morphine injection, ondansetron   Antibiotics   Anti-infectives (From admission, onward)   Start     Dose/Rate Route Frequency Ordered Stop   08/23/18 1700  Ampicillin-Sulbactam (UNASYN) 3 g in sodium chloride 0.9 % 100 mL IVPB     3 g 200 mL/hr over 30 Minutes Intravenous Every 12 hours 08/23/18 1619     08/23/18 1000  Ampicillin-Sulbactam (UNASYN) 3 g in sodium chloride 0.9 % 100 mL IVPB  Status:  Discontinued     3 g 200 mL/hr over 30 Minutes Intravenous Every 12 hours 08/23/18 0941 08/23/18 1619   08/23/18 0000  amoxicillin-clavulanate (AUGMENTIN) 500-125 MG tablet     1 tablet Oral Daily 08/23/18 1429     08/20/18 1120  vancomycin (VANCOCIN) powder  Status:  Discontinued       As needed 08/20/18 1121 08/20/18 1258   08/19/18 1200  vancomycin (VANCOCIN) IVPB 750 mg/150 ml premix  Status:  Discontinued     750 mg 150 mL/hr over 60 Minutes Intravenous Every M-W-F (Hemodialysis) 08/17/18 2321 08/19/18  1313   08/18/18 1350  vancomycin (VANCOCIN) powder  Status:  Discontinued       As needed 08/18/18 1410 08/18/18 1417   08/17/18 2330  vancomycin (VANCOCIN) 1,500 mg in sodium chloride 0.9 % 500 mL IVPB     1,500 mg 250 mL/hr over 120 Minutes Intravenous  Once 08/17/18 2319 08/18/18 0339   08/17/18 2245  piperacillin-tazobactam (ZOSYN) IVPB 3.375 g  Status:  Discontinued     3.375 g 12.5 mL/hr over 240 Minutes Intravenous Every 12 hours 08/17/18 2235 08/23/18 0941        Subjective:   [name]  was seen and examined today.  He denies any complaints today Objective:   Vitals:   08/22/18 1800 08/22/18 2059 08/23/18 0511 08/23/18 0903  BP: (!) 156/79 (!) 153/96 (!) 159/81 (!) 172/102  Pulse: 92 90 89 81  Resp: 18 18 18 18   Temp: 98.5 F (36.9 C) 98.3 F (36.8 C) 98.4 F (36.9 C) (!) 97.5 F (36.4 C)  TempSrc: Oral  Oral Oral  SpO2: 100% 100% 100% (!) 80%  Weight:  76.3 kg    Height:        Intake/Output Summary (Last 24 hours) at 08/23/2018 1634 Last data filed at 08/23/2018 1400 Gross per 24 hour  Intake 1910.66 ml  Output 0 ml  Net 1910.66 ml     Wt Readings from Last 3 Encounters:  08/22/18 76.3 kg  05/26/18 83.9 kg  05/25/18 83.9 kg   Physical Exam  Awake Alert, Oriented X 3, No new F.N deficits, Normal affect Symmetrical Chest wall movement, Good air movement bilaterally, CTAB RRR,No Gallops,Rubs or new Murmurs, No Parasternal Heave +ve B.Sounds, Abd Soft, No tenderness, No rebound - guarding or rigidity. No Cyanosis, Clubbing or edema, left foot wrapped         Data Reviewed:  I have personally reviewed following labs and imaging studies  Micro Results Recent Results (from the past 240 hour(s))  MRSA PCR Screening     Status: None   Collection Time: 08/18/18  2:28 AM  Result Value Ref Range Status   MRSA by PCR NEGATIVE NEGATIVE Final    Comment:        The GeneXpert MRSA Assay (FDA approved for NASAL specimens only), is one component of  a comprehensive MRSA colonization surveillance program. It is not intended to diagnose MRSA infection nor to guide or monitor treatment for MRSA infections. Performed at [facility], [place] [address] [place], [place] [address]   Surgical pcr screen     Status: None   Collection Time: 08/18/18 10:08 AM  Result Value Ref Range Status   MRSA, PCR NEGATIVE NEGATIVE Final   Staphylococcus aureus NEGATIVE NEGATIVE Final    Comment: (NOTE) The Xpert SA Assay (FDA approved for NASAL specimens in patients 36 years of age and older), is one component of a comprehensive surveillance program. It is not intended to diagnose infection nor to guide or monitor treatment. Performed at [facility], [place] [address], [place], [place] [address]   Fungus Culture With Stain     Status: None (Preliminary result)   Collection Time: 08/18/18  2:00 PM  Result Value Ref Range Status   Fungus Stain Final report  Final    Comment: (NOTE) Performed At: [facility] [place], [place] [id number] Rush Farmer MD [id number]    Fungus (Mycology) Culture PENDING  Incomplete   Fungal Source BONE  Final    Comment: LEFT 5TH Performed at [facility], [place] [address] [place], [place] [address]   Aerobic/Anaerobic Culture (surgical/deep wound)     Status: Abnormal   Collection Time: 08/18/18  2:00 PM  Result Value Ref Range Status   Specimen Description BONE LEFT 5TH  Final   Special Requests NONE  Final   Gram Stain   Final    RARE WBC PRESENT, PREDOMINANTLY PMN FEW GRAM NEGATIVE RODS    Culture (A)  Final    MULTIPLE ORGANISMS PRESENT, NONE PREDOMINANT RARE BACTEROIDES VULGATUS BETA LACTAMASE NEGATIVE Performed at [facility], [place]  [address], [place], [place] [address]    Report Status 08/23/2018 FINAL  Final  Fungus Culture Result     Status: None   Collection Time: 08/18/18  2:00 PM  Result Value Ref Range Status   Result 1 Comment  Final     Comment: (NOTE) KOH/Calcofluor preparation:  no fungus observed. Performed At: [facility] [place], [place] [id number] Rush Farmer MD [id number]   Fungus Culture With Stain     Status: None (Preliminary result)   Collection Time: 08/18/18  2:01 PM  Result Value Ref Range Status   Fungus Stain Final report  Final    Comment: (NOTE) Performed At: [facility] [place], [place] [id number] Rush Farmer MD [id number]    Fungus (Mycology) Culture PENDING  Incomplete   Fungal Source TISSUE  Final    Comment: LEFT 5TH Performed at [facility], [place] [address], [place], [place] [address]   Aerobic/Anaerobic Culture (surgical/deep wound)     Status: Abnormal   Collection Time: 08/18/18  2:01 PM  Result Value Ref Range Status   Specimen Description TISSUE LEFT 5TH  Final   Special Requests NONE  Final   Gram Stain NO WBC SEEN FEW GRAM NEGATIVE RODS   Final   Culture (A)  Final    MULTIPLE ORGANISMS PRESENT, NONE PREDOMINANT FEW BACTEROIDES VULGATUS BETA LACTAMASE NEGATIVE Performed at [facility], [place] [address] [place],  [address]    Report Status 08/23/2018 FINAL  Final  Fungus Culture Result     Status: None   Collection Time: 08/18/18  2:01 PM  Result Value Ref Range Status   Result 1 Comment  Final    Comment: (NOTE) KOH/Calcofluor preparation:  no fungus observed. Performed At: [facility] [place], [place] [id number] Rush Farmer MD [id number]     Radiology Reports Mr Foot Left Wo Contrast  Result Date: 08/18/2018 CLINICAL DATA:  Diabetic foot ulcer along the lateral forefoot. EXAM: MRI OF THE LEFT FOOT WITHOUT CONTRAST TECHNIQUE: Multiplanar, multisequence MR imaging of the left was performed. No intravenous contrast was administered. COMPARISON:  None. FINDINGS: Bones/Joint/Cartilage Soft tissue wound along the plantar lateral aspect of the fifth MTP joint  extending to the cortex. Cortical destruction with severe bone marrow edema in the base of the fifth proximal phalanx and fifth metatarsal head most consistent with osteomyelitis. Mild marrow edema in the fifth middle phalanx which may be reactive versus secondary to osteomyelitis. No other marrow signal abnormality. No fracture or dislocation. Normal alignment. No joint effusion. Ligaments Lisfranc ligament is intact.  Collateral ligaments are intact. Muscles and Tendons Flexor, peroneal and extensor compartment tendons are intact. Mild muscle atrophy. Soft tissue No fluid collection or hematoma. No soft tissue mass. Soft tissue edema in the lateral forefoot extending into the fifth digit most consistent with cellulitis. Small focus of low signal in the soft tissues along the medial aspect of the fifth digit likely reflecting a small amount of air. IMPRESSION: 1. Soft tissue wound along the plantar lateral aspect of the fifth MTP joint with surrounding cellulitis. Osteomyelitis of the fifth metatarsal head and base of the fifth proximal phalanx with severe surrounding marrow edema. Electronically Signed   By: Kathreen Devoid   On: 08/18/2018 10:08   Dg Foot 2 Views Left  Result Date: 08/20/2018 CLINICAL DATA:  S/p 5th metatarsal osteomyelitis resection. EXAM: LEFT FOOT - 2 VIEW COMPARISON:  08/19/2017 FINDINGS: Resection of a portion of the  remaining 5th metatarsal noted. Soft tissue postoperative changes identified. No acute bony abnormality otherwise noted. Vascular calcifications are identified. IMPRESSION: Further resection of portion of the remaining 5th metatarsal. Electronically Signed   By: Margarette Canada M.D.   On: 08/20/2018 15:26   Dg Foot 2 Views Left  Result Date: 08/19/2018 CLINICAL DATA:  S/P IRRIGATION AND DEBRIDEMENT OF LEFT 5TH TOE AND TRANSMETATARSAL, WITH PARTICAL LEFT 5TH TOE AND METATARSAL AMPUTATION, BONE BIOPSY, WOUND VAC APPLICATION FROM YESTERDAY. EXAM: LEFT FOOT - 2 VIEW COMPARISON:   Plain film of the LEFT foot dated 08/11/2018. MRI of the LEFT foot dated 08/18/2018 FINDINGS: Surgical changes of fifth metatarsal amputation, with metatarsal base remaining. Expected postsurgical changes within the overlying soft tissues. No abnormal soft tissue air. Remaining osseous structures appear intact and normal in mineralization. IMPRESSION: Status post fifth metatarsal amputation. Expected postsurgical changes within the overlying soft tissues. No evidence of surgical complicating feature. Electronically Signed   By: Franki Cabot M.D.   On: 08/19/2018 19:34   Dg Foot Complete Left  Result Date: 08/11/2018 Please see detailed radiograph report in office note.  Dg Foot Complete Left  Result Date: 08/04/2018 Please see detailed radiograph report in office note.   Lab Data:  CBC: Recent Labs  Lab 08/18/18 0719 08/19/18 0534 08/20/18 0459 08/21/18 0800 08/22/18 0816  WBC 14.0* 17.9* 9.9 15.8* 14.9*  HGB 9.3* 8.3* 10.8* 8.0* 8.0*  HCT 29.8* 26.4* 35.2* 26.2* 26.7*  MCV 92.5 93.0 94.4 96.0 96.0  PLT 261 237 238 291 287   Basic Metabolic Panel: Recent Labs  Lab 08/18/18 0719 08/19/18 0534 08/20/18 0459 08/21/18 0731 08/22/18 0816  NA 131* 129* 134* 132* 134*  K 4.7 5.4* 4.2 5.0 4.5  CL 92* 91* 96* 96* 98  CO2 25 25 26 25 27   GLUCOSE 188* 289* 84 221* 250*  BUN 47* 58* 26* 37* 23*  CREATININE 10.45* 12.34* 7.71* 8.90* 7.29*  CALCIUM 8.8* 8.6* 8.9 8.6* 9.0  PHOS 4.9*  --   --  6.4*  --    GFR: Estimated Creatinine Clearance: 12.4 mL/min (A) (by C-G formula based on SCr of 7.29 mg/dL (H)). Liver Function Tests: Recent Labs  Lab 08/18/18 0719 08/21/18 0731  ALBUMIN 2.6* 2.3*   No results for input(s): LIPASE, AMYLASE in the last 168 hours. No results for input(s): AMMONIA in the last 168 hours. Coagulation Profile: No results for input(s): INR, PROTIME in the last 168 hours. Cardiac Enzymes: No results for input(s): CKTOTAL, CKMB, CKMBINDEX, TROPONINI in  the last 168 hours. BNP (last 3 results) No results for input(s): PROBNP in the last 8760 hours. HbA1C: No results for input(s): HGBA1C in the last 72 hours. CBG: Recent Labs  Lab 08/22/18 1804 08/22/18 2059 08/23/18 0753 08/23/18 1145 08/23/18 1325  GLUCAP 230* 175* 145* 255* 229*   Lipid Profile: No results for input(s): CHOL, HDL, LDLCALC, TRIG, CHOLHDL, LDLDIRECT in the last 72 hours. Thyroid Function Tests: No results for input(s): TSH, T4TOTAL, FREET4, T3FREE, THYROIDAB in the last 72 hours. Anemia Panel: No results for input(s): VITAMINB12, FOLATE, FERRITIN, TIBC, IRON, RETICCTPCT in the last 72 hours. Urine analysis:    Component Value Date/Time   COLORURINE YELLOW 05/26/2015 1115   APPEARANCEUR CLEAR 05/26/2015 1115   APPEARANCEUR Hazy 07/30/2014 2303   LABSPEC 1.016 05/26/2015 1115   LABSPEC 1.012 07/30/2014 2303   PHURINE 8.0 05/26/2015 1115   GLUCOSEU 500 (A) 05/26/2015 1115   GLUCOSEU >=500 07/30/2014 2303   HGBUR MODERATE (A) 05/26/2015  [place] 05/26/2015 1115   BILIRUBINUR Negative 07/30/2014 2303   KETONESUR 15 (A) 05/26/2015 1115   PROTEINUR >300 (A) 05/26/2015 1115   UROBILINOGEN 0.2 05/26/2015 1115   NITRITE NEGATIVE 05/26/2015 1115   LEUKOCYTESUR NEGATIVE 05/26/2015 1115   LEUKOCYTESUR Negative 07/30/2014 [address] Kjos M.D. [facility] 08/23/2018, 4:34 PM  Pager: [phone] Between 7am to 7pm - call Pager - [phone]  After 7pm go to [email or web address] - password [email or web address]  Call night coverage person covering after 7pm

## 2018-08-23 NOTE — Progress Notes (Signed)
Patient's access had no bruit or thrill, assessed by two RNs, Juanell Fairly NP was informed and floor nurse Wyvonnia Lora RN was informed as well

## 2018-08-23 NOTE — Progress Notes (Signed)
[facility] Progress Note   Subjective: Very pleasant and animated today. No new complaints. Refused wound vac-concerned about OP ABX. Otherwise, seems happy.     Objective Vitals:   08/22/18 1800 08/22/18 2059 08/23/18 0511 08/23/18 0903  BP: (!) 156/79 (!) 153/96 (!) 159/81 (!) 172/102  Pulse: 92 90 89 81  Resp: 18 18 18 18   Temp: 98.5 F (36.9 C) 98.3 F (36.8 C) 98.4 F (36.9 C) (!) 97.5 F (36.4 C)  TempSrc: Oral  Oral Oral  SpO2: 100% 100% 100% (!) 80%  Weight:  76.3 kg    Height:       Physical Exam General: WN, WD NAD Heart: S1,S2 RRR Lungs: CTAB Abdomen: S, NT Extremities: No LE edema. Ace wrap L foot.  Dialysis Access: L AVG + bruit   Additional Objective Labs: Basic Metabolic Panel: Recent Labs  Lab 08/18/18 0719  08/20/18 0459 08/21/18 0731 08/22/18 0816  NA 131*   < > 134* 132* 134*  K 4.7   < > 4.2 5.0 4.5  CL 92*   < > 96* 96* 98  CO2 25   < > 26 25 27   GLUCOSE 188*   < > 84 221* 250*  BUN 47*   < > 26* 37* 23*  CREATININE 10.45*   < > 7.71* 8.90* 7.29*  CALCIUM 8.8*   < > 8.9 8.6* 9.0  PHOS 4.9*  --   --  6.4*  --    < > = values in this interval not displayed.   Liver Function Tests: Recent Labs  Lab 08/18/18 0719 08/21/18 0731  ALBUMIN 2.6* 2.3*   No results for input(s): LIPASE, AMYLASE in the last 168 hours. CBC: Recent Labs  Lab 08/18/18 0719 08/19/18 0534 08/20/18 0459 08/21/18 0800 08/22/18 0816  WBC 14.0* 17.9* 9.9 15.8* 14.9*  HGB 9.3* 8.3* 10.8* 8.0* 8.0*  HCT 29.8* 26.4* 35.2* 26.2* 26.7*  MCV 92.5 93.0 94.4 96.0 96.0  PLT 261 237 238 291 301   Blood Culture    Component Value Date/Time   SDES TISSUE LEFT 5TH 08/18/2018 1401   SPECREQUEST NONE 08/18/2018 1401   CULT (A) 08/18/2018 1401    MULTIPLE ORGANISMS PRESENT, NONE PREDOMINANT FEW BACTEROIDES VULGATUS BETA LACTAMASE NEGATIVE Performed at [facility] [address] [name], [place] [address]    REPTSTATUS 08/23/2018 FINAL  08/18/2018 1401    Cardiac Enzymes: No results for input(s): CKTOTAL, CKMB, CKMBINDEX, TROPONINI in the last 168 hours. CBG: Recent Labs  Lab 08/22/18 0732 08/22/18 1111 08/22/18 1804 08/22/18 2059 08/23/18 0753  GLUCAP 247* 275* 230* 175* 145*   Iron Studies: No results for input(s): IRON, TIBC, TRANSFERRIN, FERRITIN in the last 72 hours. @lablastinr3 @ Studies/Results: No results found. Medications: . sodium chloride 10 mL/hr at 08/20/18 0948  . ampicillin-sulbactam (UNASYN) IV     . amLODipine  10 mg Oral Daily  . calcitRIOL  2.25 mcg Oral Q M,W,F-HD  . Chlorhexidine Gluconate Cloth  6 each Topical Q0600  . cinacalcet  30 mg Oral Q supper  . collagenase  1 application Topical Daily  . [START ON 08/26/2018] darbepoetin (ARANESP) injection - DIALYSIS  100 mcg Intravenous Q Fri-HD  . feeding supplement (PRO-STAT SUGAR FREE 64)  30 mL Oral BID  . heparin injection (subcutaneous)  5,000 Units Subcutaneous Q8H  . insulin aspart  0-5 Units Subcutaneous QHS  . insulin aspart  0-9 Units Subcutaneous TID WC  . insulin aspart  5 Units Subcutaneous TID  WC  . insulin glargine  15 Units Subcutaneous QHS  . metoCLOPramide  5 mg Oral TID AC  . sevelamer carbonate  3,200 mg Oral TID WC    HD orders: [facility] MWF 4 hrs 180NRe 400/800 75.5 kg 2.0 K/ 2.0 Ca  L AVG -Heparin 5000 units IV TIW -Calcitriol 2.5 mcg PO TIW  Assessment/Plan: 1. L Foot ulcer with osteomyelitis: S/P 5th toe amp and wound vac 11/21. To OR 08/20/18 for bone bx/I & D. ID consulted. On Zosyn.  2. ESRD -MWF via AVG. HD today on holiday schedule. K+ 4.5. Hold heparin.  3. Anemia - HGB 8.0. Persistent downward trend. Increase Aranesp dose to 100 mcg IV.  4. Secondary hyperparathyroidism - Continue binders, VDRA. Ca 9.0 C Ca 10.4 Decrease VDRA. Phos 6.4.  5. HTN/volume - BP variable. HD 11/24 on holiday schedule. Pre wt 81 kgs Net UF 5 liters Post wt 76.2 kg. Push to OP EDW today.  6. Nutrition - Albumin 2.6 Renal/Carb  mod diet, add prostat, renal vits. 7. DMT1-per primary   Jimmye Norman. Termaine Roupp NP-C 08/23/2018, 11:53 AM  [employer] [phone]

## 2018-08-23 NOTE — Discharge Instructions (Signed)
Follow with Primary MD [name], [name], MD in 7 days    Activity: As tolerated with Full fall precautions use walker/cane & assistance as needed   Disposition Home    Diet: renal diet    For Heart failure patients - Check your Weight same time everyday, if you gain over 2 pounds, or you develop in leg swelling, experience more shortness of breath or chest pain, call your Primary MD immediately. Follow Cardiac Low Salt Diet and 1.5 lit/day fluid restriction.   On your next visit with your primary care physician please Get Medicines reviewed and adjusted.   Please request your Prim.MD to go over all Hospital Tests and Procedure/Radiological results at the follow up, please get all Hospital records sent to your Prim MD by signing hospital release before you go home.   If you experience worsening of your admission symptoms, develop shortness of breath, life threatening emergency, suicidal or homicidal thoughts you must seek medical attention immediately by calling 911 or calling your MD immediately  if symptoms less severe.  You Must read complete instructions/literature along with all the possible adverse reactions/side effects for all the Medicines you take and that have been prescribed to you. Take any new Medicines after you have completely understood and accpet all the possible adverse reactions/side effects.   Do not drive, operating heavy machinery, perform activities at heights, swimming or participation in water activities or provide baby sitting services if your were admitted for syncope or siezures until you have seen by Primary MD or a Neurologist and advised to do so again.  Do not drive when taking Pain medications.    Do not take more than prescribed Pain, Sleep and Anxiety Medications  Special Instructions: If you have smoked or chewed Tobacco  in the last 2 yrs please stop smoking, stop any regular Alcohol  and or any Recreational drug use.  Wear Seat belts while  driving.   Please note  You were cared for by a hospitalist during your hospital stay. If you have any questions about your discharge medications or the care you received while you were in the hospital after you are discharged, you can call the unit and asked to speak with the hospitalist on call if the hospitalist that took care of you is not available. Once you are discharged, your primary care physician will handle any further medical issues. Please note that NO REFILLS for any discharge medications will be authorized once you are discharged, as it is imperative that you return to your primary care physician (or establish a relationship with a primary care physician if you do not have one) for your aftercare needs so that they can reassess your need for medications and monitor your lab values.

## 2018-08-23 NOTE — Care Management Note (Addendum)
Case Management Note  Patient Details  Name: [name] MRN: [id number] Date of Birth: December 01, 1980   Subjective/Objective: Presented with  diabetic foot ulcer.  Hx of  hypertension, ESRD on hemodialysis, uncontrolled type 1 diabetes with a chronic diabetic left foot ulcer. From home with mom, Rodena Piety. PTA active with [facility], home health services.(RN).                              S/p Left 5th toe amputation 08/20/18/ vac dressing  11/26 Larm graft thrombolysis .Marland Kitchen..possible thrombolysis Scheduled for 11/27 am    [name] (Mother)      [phone]      PCP: Mauricia Area  Action/Plan: Transition to home when medically stable with the resumption og home health services provided per [place].  States has transportation to home once d/c.   Expected Discharge Date:  08/23/18               Expected Discharge Plan:  [place]  In-House Referral:     Discharge planning Services  CM Consult  Post Acute Care Choice:    Choice offered to:  Patient  DME Arranged:  N/A DME Agency:  NA  HH Arranged:  RN [place] Agency:   [place], Liaison made aware of pt's d/c plan and following.   Status of Service:  completed  If discussed at Long Length of Stay Meetings, dates discussed:    Additional Comments:  Sharin Mons, RN 08/23/2018, 4:02 PM

## 2018-08-23 NOTE — Consult Note (Addendum)
Chief Complaint: Patient was seen in consultation today for left arm graft evaluation; thrombolysis at the request of Dr Mickel Crow  Supervising Physician: Markus Daft  Patient Status: [facility] - In-pt  History of Present Illness: [name] is a 37 y.o. male   Pt in house for diabetic foot infection; osteomyelitis Admitted for amputation per pt report Left 5th toe amputation 08/20/18  In dialysis today RN was unable to feel pulse or bruit Was unable to start dialysis at all Pt has had many CV Vascular interventions per his report  Request for evaluation in IR- possible thrombolysis Scheduled for 11/27 am   Past Medical History:  Diagnosis Date  . Diabetes mellitus without complication ([place])   . Diabetic gastroparesis ([place])   . Dialysis patient ([place])   . Hypertension   . Renal disorder    Dialysis  . Sepsis [facility])     Past Surgical History:  Procedure Laterality Date  . AMPUTATION Right 02/02/2018   Procedure: RIGHT FIFTH TOE AND METATARSAL AMPUTATION. Filetted toe flap metatarsal resection. Debridement Plantar Foot wound;  Surgeon: Evelina Bucy, DPM;  Location: [place];  Service: Podiatry;  Laterality: Right;  . AMPUTATION Left 08/20/2018   Procedure: FIFTH METATARSAL BONE BIOPSY;  Surgeon: Evelina Bucy, DPM;  Location: [place];  Service: Podiatry;  Laterality: Left;  . APPLICATION OF WOUND VAC  02/02/2018   Procedure: APPLICATION OF WOUND VAC  Right Foot;  Surgeon: Evelina Bucy, DPM;  Location: [place];  Service: Podiatry;;  . AV FISTULA PLACEMENT     left arm.  . AV FISTULA PLACEMENT Right 12/22/2016   Procedure: INSERTION OF ARTERIOVENOUS (AV) GORE-TEX GRAFT ARM;  Surgeon: Elam Dutch, MD;  Location: [facility] OR;  Service: Vascular;  Laterality: Right;  . AV FISTULA PLACEMENT Left 05/26/2018   Procedure: INSERTION OF  ARTERIOVENOUS (AV) GORE-TEX GRAFT LEFT ARM;  Surgeon: Serafina Mitchell, MD;  Location: [place];  Service: Vascular;  Laterality: Left;  . EYE SURGERY     . I&D EXTREMITY Right 10/31/2017   Procedure: IRRIGATION AND DEBRIDEMENT RIGHT FOOT;  Surgeon: Evelina Bucy, DPM;  Location: [place];  Service: Podiatry;  Laterality: Right;  . I&D EXTREMITY Left 08/20/2018   Procedure: IRRIGATION AND DEBRIDEMENT EXTREMITY WITH SECONDARY WOUND CLOSUREAND APPLICATION OF WOUND VAC LEFT FOOT;  Surgeon: Evelina Bucy, DPM;  Location: [place];  Service: Podiatry;  Laterality: Left;  . INSERTION OF DIALYSIS CATHETER     Right subclavian  . IR AV DIALY SHUNT INTRO NEEDLE/INTRACATH INITIAL W/PTA/IMG RIGHT Right 02/05/2018  . IR US GUIDE VASC ACCESS RIGHT  02/05/2018  . TRANSMETATARSAL AMPUTATION N/A 08/18/2018   Procedure: IRRIGATION AND DEBRIDEMENT OF LEFT 5TH TOE AND TRANSMETATARSAL, WITH PARTICAL LEFT 5TH TOE AND METATARSAL AMPUTATION, BONE BIOPSY, WOUND VAC APPLICATION.;  Surgeon: Evelina Bucy, DPM;  Location: [place];  Service: Podiatry;  Laterality: N/A;  . UPPER EXTREMITY VENOGRAPHY N/A 11/16/2016   Procedure: Upper Extremity Venography - Right Central;  Surgeon: Elam Dutch, MD;  Location: [place] CV LAB;  Service: Cardiovascular;  Laterality: N/A;  . UPPER EXTREMITY VENOGRAPHY N/A 05/25/2018   Procedure: UPPER EXTREMITY VENOGRAPHY - Bilateral;  Surgeon: Marty Heck, MD;  Location: [place] CV LAB;  Service: Cardiovascular;  Laterality: N/A;    Allergies: Patient has no known allergies.  Medications: Prior to Admission medications   Medication Sig Start Date End Date Taking? Authorizing Provider  acetaminophen (TYLENOL) 325 MG tablet Take 2 tablets (  650 mg total) by mouth every 6 (six) hours as needed for mild pain or headache (pain/headache). Can restart this after you are done with Oxycodone/Tylenol 11/10/17  Yes Domenic Polite, MD  amLODipine (NORVASC) 10 MG tablet Take 10 mg by mouth daily.   Yes [provider]  cinacalcet (SENSIPAR) 30 MG tablet Take 30 mg by mouth daily with supper.   Yes [provider]    clindamycin (CLEOCIN) 300 MG capsule Take 1 capsule (300 mg total) by mouth 2 (two) times daily. 08/11/18  Yes Evelina Bucy, DPM  clonazePAM (KLONOPIN) 0.5 MG tablet Take 0.5 mg by mouth daily as needed for anxiety (sleep).  07/21/17  Yes [provider]  cloNIDine (CATAPRES - DOSED IN MG/24 HR) 0.3 mg/24hr patch APPLY ONE PATCH TOPICALLY ONCE A WEEK 06/01/16  Yes [provider]  collagenase (SANTYL) ointment Apply 1 application topically daily. 07/07/18  Yes Evelina Bucy, DPM  insulin aspart (NOVOLOG) 100 UNIT/ML injection Inject 5 Units into the skin 2 (two) times daily with a meal.    Yes [provider]  losartan (COZAAR) 50 MG tablet Take 50 mg by mouth daily. 09/27/17  Yes [provider]  metoCLOPramide (REGLAN) 5 MG tablet TAKE ONE TABLET BY MOUTH THREE TIMES DAILY BEFORE MEAL(S) 12/08/15  Yes [provider]  ondansetron (ZOFRAN ODT) 4 MG disintegrating tablet Take 1 tablet (4 mg total) by mouth every 8 (eight) hours as needed for nausea or vomiting. 11/23/17  Yes Ward, Ozella Almond, PA-C  oxyCODONE-acetaminophen (PERCOCET/ROXICET) 5-325 MG tablet Take 1 tablet by mouth every 6 (six) hours as needed. 05/26/18  Yes Ulyses Amor, PA-C  sevelamer carbonate (RENVELA) 800 MG tablet Take 2,400-3,200 mg by mouth See admin instructions. Take 3 - 4 tablets (2400 mg -3200 mg) by mouth two times daily with meals   Yes [provider]  Amino Acids-Protein Hydrolys (FEEDING SUPPLEMENT, PRO-STAT SUGAR FREE 64,) LIQD Take 30 mLs by mouth 2 (two) times daily. 08/23/18   Elgergawy, Silver Huguenin, MD  amoxicillin-clavulanate (AUGMENTIN) 500-125 MG tablet Take 1 tablet (500 mg total) by mouth daily. 08/23/18   Elgergawy, Silver Huguenin, MD  collagenase (SANTYL) ointment Apply 1 application topically daily. 08/23/18   Elgergawy, Silver Huguenin, MD  glucose blood ([employer] TEST STRIPS) test strip by Other route 4 (four) times a day as needed. 10/29/16   [provider]  insulin glargine (LANTUS) 100 UNIT/ML injection Inject 0.15 mLs (15 Units total) into the skin at bedtime. 08/23/18   Elgergawy, Silver Huguenin, MD  lactobacillus acidophilus & bulgar (LACTINEX) chewable tablet Chew 1 tablet by mouth 3 (three) times daily with meals. 08/23/18   Elgergawy, Silver Huguenin, MD     Family History  Problem Relation Age of Onset  . Diabetes Mellitus II Unknown   . Diabetes Father   . Renal Disease Father        ESRD    Social History   Socioeconomic History  . Marital status: Single    Spouse name: Not on file  . Number of children: Not on file  . Years of education: Not on file  . Highest education level: Not on file  Occupational History  . Not on file  Social Needs  . Financial resource strain: Not on file  . Food insecurity:    Worry: Not on file    Inability: Not on file  . Transportation needs:    Medical: Not on file    Non-medical: Not on  file  Tobacco Use  . Smoking status: Never Smoker  . Smokeless tobacco: Never Used  Substance and Sexual Activity  . Alcohol use: No  . Drug use: No  . Sexual activity: Never  Lifestyle  . Physical activity:    Days per week: Not on file    Minutes per session: Not on file  . Stress: Not on file  Relationships  . Social connections:    Talks on phone: Not on file    Gets together: Not on file    Attends religious service: Not on file    Active member of club or organization: Not on file    Attends meetings of clubs or organizations: Not on file    Relationship status: Not on file  Other Topics Concern  . Not on file  Social History Narrative  . Not on file    Review of Systems: A 12 point ROS discussed and pertinent positives are indicated in the HPI above.  All other systems are negative.  Review of Systems  Constitutional: Positive for activity change. Negative for appetite change, fatigue and fever.  Respiratory: Negative for cough and shortness of breath.   Cardiovascular:  Negative for chest pain.  Gastrointestinal: Negative for abdominal pain.  Neurological: Negative for weakness.  Psychiatric/Behavioral: Negative for behavioral problems and confusion.    Vital Signs: BP (!) 172/102 (BP Location: Right Arm)   Pulse 81   Temp (!) 97.5 F (36.4 C) (Oral)   Resp 18   Ht 5\' 6"  (1.676 m)   Wt 168 lb 3.4 oz (76.3 kg)   SpO2 (!) 80%   BMI 27.15 kg/m   Physical Exam  Constitutional: He is oriented to person, place, and time.  Cardiovascular: Normal rate and regular rhythm.  Pulmonary/Chest: Effort normal and breath sounds normal.  Abdominal: Soft. Bowel sounds are normal.  Musculoskeletal: Normal range of motion.  Left arm dialysis graft No thrill; no pulse  Neurological: He is alert and oriented to person, place, and time.  Skin: Skin is warm and dry.  Psychiatric: He has a normal mood and affect. His behavior is normal. Judgment and thought content normal.  Vitals reviewed.   Imaging: Mr Foot Left Wo Contrast  Result Date: 08/18/2018 CLINICAL DATA:  Diabetic foot ulcer along the lateral forefoot. EXAM: MRI OF THE LEFT FOOT WITHOUT CONTRAST TECHNIQUE: Multiplanar, multisequence MR imaging of the left was performed. No intravenous contrast was administered. COMPARISON:  None. FINDINGS: Bones/Joint/Cartilage Soft tissue wound along the plantar lateral aspect of the fifth MTP joint extending to the cortex. Cortical destruction with severe bone marrow edema in the base of the fifth proximal phalanx and fifth metatarsal head most consistent with osteomyelitis. Mild marrow edema in the fifth middle phalanx which may be reactive versus secondary to osteomyelitis. No other marrow signal abnormality. No fracture or dislocation. Normal alignment. No joint effusion. Ligaments Lisfranc ligament is intact.  Collateral ligaments are intact. Muscles and Tendons Flexor, peroneal and extensor compartment tendons are intact. Mild muscle atrophy. Soft tissue No fluid  collection or hematoma. No soft tissue mass. Soft tissue edema in the lateral forefoot extending into the fifth digit most consistent with cellulitis. Small focus of low signal in the soft tissues along the medial aspect of the fifth digit likely reflecting a small amount of air. IMPRESSION: 1. Soft tissue wound along the plantar lateral aspect of the fifth MTP joint with surrounding cellulitis. Osteomyelitis of the fifth metatarsal head and base of the fifth proximal phalanx  with severe surrounding marrow edema. Electronically Signed   By: Kathreen Devoid   On: 08/18/2018 10:08   Dg Foot 2 Views Left  Result Date: 08/20/2018 CLINICAL DATA:  S/p 5th metatarsal osteomyelitis resection. EXAM: LEFT FOOT - 2 VIEW COMPARISON:  08/19/2017 FINDINGS: Resection of a portion of the remaining 5th metatarsal noted. Soft tissue postoperative changes identified. No acute bony abnormality otherwise noted. Vascular calcifications are identified. IMPRESSION: Further resection of portion of the remaining 5th metatarsal. Electronically Signed   By: Margarette Canada M.D.   On: 08/20/2018 15:26   Dg Foot 2 Views Left  Result Date: 08/19/2018 CLINICAL DATA:  S/P IRRIGATION AND DEBRIDEMENT OF LEFT 5TH TOE AND TRANSMETATARSAL, WITH PARTICAL LEFT 5TH TOE AND METATARSAL AMPUTATION, BONE BIOPSY, WOUND VAC APPLICATION FROM YESTERDAY. EXAM: LEFT FOOT - 2 VIEW COMPARISON:  Plain film of the LEFT foot dated 08/11/2018. MRI of the LEFT foot dated 08/18/2018 FINDINGS: Surgical changes of fifth metatarsal amputation, with metatarsal base remaining. Expected postsurgical changes within the overlying soft tissues. No abnormal soft tissue air. Remaining osseous structures appear intact and normal in mineralization. IMPRESSION: Status post fifth metatarsal amputation. Expected postsurgical changes within the overlying soft tissues. No evidence of surgical complicating feature. Electronically Signed   By: Franki Cabot M.D.   On: 08/19/2018 19:34    Dg Foot Complete Left  Result Date: 08/11/2018 Please see detailed radiograph report in office note.  Dg Foot Complete Left  Result Date: 08/04/2018 Please see detailed radiograph report in office note.   Labs:  CBC: Recent Labs    08/19/18 0534 08/20/18 0459 08/21/18 0800 08/22/18 0816  WBC 17.9* 9.9 15.8* 14.9*  HGB 8.3* 10.8* 8.0* 8.0*  HCT 26.4* 35.2* 26.2* 26.7*  PLT 237 238 291 301    COAGS: Recent Labs    10/25/17 1800 02/05/18 0848  INR 1.16 1.00    BMP: Recent Labs    08/19/18 0534 08/20/18 0459 08/21/18 0731 08/22/18 0816  NA 129* 134* 132* 134*  K 5.4* 4.2 5.0 4.5  CL 91* 96* 96* 98  CO2 25 26 25 27   GLUCOSE 289* 84 221* 250*  BUN 58* 26* 37* 23*  CALCIUM 8.6* 8.9 8.6* 9.0  CREATININE 12.34* 7.71* 8.90* 7.29*  GFRNONAA 4* 8* 7* 9*  GFRAA 5* 9* 8* 10*    LIVER FUNCTION TESTS: Recent Labs    10/25/17 1800  11/22/17 1942 01/31/18 2248 02/02/18 0731 02/04/18 1558 08/18/18 0719 08/21/18 0731  BILITOT 2.0*  --  1.2 0.5  --   --   --   --   AST 18  --  17 15  --   --   --   --   ALT 10*  --  <5* 10*  --   --   --   --   ALKPHOS 114  --  99 120  --   --   --   --   PROT 7.1  --  7.9 8.0  --   --   --   --   ALBUMIN 3.4*   < > 3.3* 3.0* 2.4* 2.4* 2.6* 2.3*   < > = values in this interval not displayed.    TUMOR MARKERS: No results for input(s): AFPTM, CEA, CA199, CHROMGRNA in the last 8760 hours.  Assessment and Plan:  Left foot diabetic foot infection--  amputaion left 5th toe 08/20/18 ESRD Left arm dialysis graft thrombolysis with possible angioplasty/stent placement Possible tunneled dialysis catheter placement Risks and benefits discussed  with the patient including, but not limited to bleeding, infection, vascular injury, pulmonary embolism, need for tunneled HD catheter placement or even death.  All of the patient's questions were answered, patient is agreeable to proceed. Consent signed and in chart.  Thank you for this  interesting consult.  I greatly enjoyed meeting [name] and look forward to participating in their care.  A copy of this report was sent to the requesting provider on this date.  Electronically Signed: Lavonia Drafts, PA-C 08/23/2018, 3:46 PM   I spent a total of 20 Minutes    in face to face in clinical consultation, greater than 50% of which was counseling/coordinating care for left arm dialysis grfaft thrombolysis

## 2018-08-23 NOTE — Progress Notes (Signed)
[facility] for Infectious Disease    Date of Admission:  08/17/2018   Total days of antibiotics 7          Day 1 unasyn   ID: CATALDO COSGRIFF is a 37 y.o. 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 Principal Problem:   Diabetic foot infection ([place]) Active Problems:   Diabetes mellitus type 1 ([place])   Diabetic gastroparesis ([place])   HTN (hypertension)   Cellulitis and abscess of foot   ESRD (end stage renal disease) ([place])   Anemia of chronic disease   Acute osteomyelitis of left ankle or foot ([name])   Osteomyelitis of fifth toe of left foot ([name])   Diabetic ulcer of left midfoot associated with diabetes mellitus due to underlying condition, with necrosis of bone ([place])   Encounter for planned post-operative wound closure   Osteomyelitis of foot, left, acute ([name])    Subjective: Afebrile, feeling like he has some improvement in left foot. Upset that I am not his HD note.   Medications:  . amLODipine  10 mg Oral Daily  . calcitRIOL  2.25 mcg Oral Q M,W,F-HD  . Chlorhexidine Gluconate Cloth  6 each Topical Q0600  . cinacalcet  30 mg Oral Q supper  . collagenase  1 application Topical Daily  . [START ON 08/26/2018] darbepoetin (ARANESP) injection - DIALYSIS  100 mcg Intravenous Q Fri-HD  . feeding supplement (PRO-STAT SUGAR FREE 64)  30 mL Oral BID  . heparin injection (subcutaneous)  5,000 Units Subcutaneous Q8H  . insulin aspart  0-5 Units Subcutaneous QHS  . insulin aspart  0-9 Units Subcutaneous TID WC  . insulin aspart  5 Units Subcutaneous TID WC  . insulin glargine  15 Units Subcutaneous QHS  . metoCLOPramide  5 mg Oral TID AC  . sevelamer carbonate  3,200 mg Oral TID WC    Objective: Vital signs in last 24 hours: Temp:  [97.5 F (36.4 C)-98.5 F (36.9 C)] 97.5 F (36.4 C) (11/26 0903) Pulse Rate:  [81-92] 81 (11/26 0903) Resp:  [18] 18 (11/26 0903) BP: (153-172)/(79-102) 172/102 (11/26 0903) SpO2:  [80 %-100 %] 80 % (11/26 0903) Weight:  [76.3 kg] 76.3 kg (11/25  2059) Physical Exam  Constitutional: He is oriented to person, place, and time. He appears well-developed and well-nourished. No distress.  HENT:  Mouth/Throat: Oropharynx is clear and moist. No oropharyngeal exudate.  Cardiovascular: Normal rate, regular rhythm and normal heart sounds. Exam reveals no gallop and no friction rub.  No murmur heard.  Pulmonary/Chest: Effort normal and breath sounds normal. No respiratory distress. He has no wheezes.  Abdominal: Soft. Bowel sounds are normal. He exhibits no distension. There is no tenderness.  GTX:MIWO foot bandaged, left arm-upper extremity bandaged from HD Neurological: He is alert and oriented to person, place, and time.  Skin: Skin is warm and dry. No rash noted. No erythema.  Psychiatric: He has a normal mood and affect. His behavior is normal.    Lab Results Recent Labs    08/21/18 0731 08/21/18 0800 08/22/18 0816  WBC  --  15.8* 14.9*  HGB  --  8.0* 8.0*  HCT  --  26.2* 26.7*  NA 132*  --  134*  K 5.0  --  4.5  CL 96*  --  98  CO2 25  --  27  BUN 37*  --  23*  CREATININE 8.90*  --  7.29*   Liver Panel Recent Labs    08/21/18 0731  ALBUMIN 2.3*   Lab Results  Component Value Date   ESRSEDRATE 78 (H) 08/18/2018    Microbiology: -bacteroides   Studies/Results: No results found.   Assessment/Plan: Left foot osteomyelitis = bone cx sent to see if clean margins had + growth with bacteroides. Based on op note seems that margins were not clean and needs longer course of infection treatment. Will change to ampicillin-sulbactam while inpatient (no pseudomonas history and none recovered). Plan to D/C with 28d amoxicillin-clavulanate 500-125 mg QD after HD. Will arrange to see him in follow up outpatient clinic to determine further need for therapy. Needs outpatient wound care ongoing. He is resistant to multiple treatment suggestions surrounding this but likes the idea of santyl.   Medication Monitoring = will check  CRP/ESR after 4 weeks of treatment   Janene Madeira, MSN, NP-C [place] for Infectious Disease [email or web address] Pager: [phone] Office: [phone]   08/23/2018, 1:10 PM

## 2018-08-24 ENCOUNTER — Encounter: Payer: Medicare Other | Admitting: Podiatry

## 2018-08-24 ENCOUNTER — Encounter: Payer: Self-pay | Admitting: Internal Medicine

## 2018-08-24 ENCOUNTER — Encounter (HOSPITAL_COMMUNITY): Payer: Self-pay | Admitting: Diagnostic Radiology

## 2018-08-24 ENCOUNTER — Inpatient Hospital Stay (HOSPITAL_COMMUNITY): Payer: Medicare Other

## 2018-08-24 DIAGNOSIS — E08621 Diabetes mellitus due to underlying condition with foot ulcer: Secondary | ICD-10-CM

## 2018-08-24 DIAGNOSIS — T8249XD Other complication of vascular dialysis catheter, subsequent encounter: Secondary | ICD-10-CM

## 2018-08-24 DIAGNOSIS — M869 Osteomyelitis, unspecified: Secondary | ICD-10-CM

## 2018-08-24 DIAGNOSIS — T8249XA Other complication of vascular dialysis catheter, initial encounter: Secondary | ICD-10-CM

## 2018-08-24 HISTORY — PX: IR THROMBECTOMY AV FISTULA W/THROMBOLYSIS/PTA INC/SHUNT/IMG LEFT: IMG6106

## 2018-08-24 HISTORY — PX: IR US GUIDE VASC ACCESS LEFT: IMG2389

## 2018-08-24 LAB — CBC WITH DIFFERENTIAL/PLATELET
Abs Immature Granulocytes: 0.1 10*3/uL — ABNORMAL HIGH (ref 0.00–0.07)
Basophils Absolute: 0.1 10*3/uL (ref 0.0–0.1)
Basophils Relative: 0 %
Eosinophils Absolute: 0.6 10*3/uL — ABNORMAL HIGH (ref 0.0–0.5)
Eosinophils Relative: 4 %
HCT: 26.8 % — ABNORMAL LOW (ref 39.0–52.0)
Hemoglobin: 7.9 g/dL — ABNORMAL LOW (ref 13.0–17.0)
Immature Granulocytes: 1 %
Lymphocytes Relative: 16 %
Lymphs Abs: 2.2 10*3/uL (ref 0.7–4.0)
MCH: 28.3 pg (ref 26.0–34.0)
MCHC: 29.5 g/dL — ABNORMAL LOW (ref 30.0–36.0)
MCV: 96.1 fL (ref 80.0–100.0)
Monocytes Absolute: 0.8 10*3/uL (ref 0.1–1.0)
Monocytes Relative: 6 %
Neutro Abs: 10.2 10*3/uL — ABNORMAL HIGH (ref 1.7–7.7)
Neutrophils Relative %: 73 %
Platelets: 380 10*3/uL (ref 150–400)
RBC: 2.79 MIL/uL — ABNORMAL LOW (ref 4.22–5.81)
RDW: 14.8 % (ref 11.5–15.5)
WBC: 13.9 10*3/uL — ABNORMAL HIGH (ref 4.0–10.5)
nRBC: 0 % (ref 0.0–0.2)

## 2018-08-24 LAB — BASIC METABOLIC PANEL
Anion gap: 13 (ref 5–15)
BUN: 42 mg/dL — ABNORMAL HIGH (ref 6–20)
CO2: 22 mmol/L (ref 22–32)
Calcium: 9.1 mg/dL (ref 8.9–10.3)
Chloride: 97 mmol/L — ABNORMAL LOW (ref 98–111)
Creatinine, Ser: 10.88 mg/dL — ABNORMAL HIGH (ref 0.61–1.24)
GFR calc Af Amer: 6 mL/min — ABNORMAL LOW (ref >60)
GFR calc non Af Amer: 5 mL/min — ABNORMAL LOW (ref >60)
Glucose, Bld: 127 mg/dL — ABNORMAL HIGH (ref 70–99)
Potassium: 5.1 mmol/L (ref 3.5–5.1)
Sodium: 132 mmol/L — ABNORMAL LOW (ref 135–145)

## 2018-08-24 LAB — PROTIME-INR
INR: 1.08
Prothrombin Time: 14 seconds (ref 11.4–15.2)

## 2018-08-24 LAB — GLUCOSE, CAPILLARY
Glucose-Capillary: 91 mg/dL (ref 70–99)
Glucose-Capillary: 129 mg/dL — ABNORMAL HIGH (ref 70–99)
Glucose-Capillary: 178 mg/dL — ABNORMAL HIGH (ref 70–99)

## 2018-08-24 MED ORDER — LIDOCAINE HCL (PF) 1 % IJ SOLN: 5.0000 mL | INTRAMUSCULAR | Status: DC | PRN

## 2018-08-24 MED ORDER — CALCITRIOL 0.25 MCG PO CAPS
ORAL_CAPSULE | ORAL | Status: AC
  Filled 2018-08-24: qty 1

## 2018-08-24 MED ORDER — HEPARIN SODIUM (PORCINE) 1000 UNIT/ML DIALYSIS: 1000.0000 [IU] | INTRAMUSCULAR | Status: DC | PRN

## 2018-08-24 MED ORDER — LIDOCAINE HCL 1 % IJ SOLN
INTRAMUSCULAR | Status: AC
  Filled 2018-08-24: qty 20

## 2018-08-24 MED ORDER — CHLORHEXIDINE GLUCONATE CLOTH 2 % EX PADS: 6.0000 | MEDICATED_PAD | Freq: Every day | CUTANEOUS | Status: DC

## 2018-08-24 MED ORDER — ONDANSETRON HCL 4 MG/2ML IJ SOLN
INTRAMUSCULAR | Status: AC
  Administered 2018-08-24: 4 mg
  Filled 2018-08-24: qty 2

## 2018-08-24 MED ORDER — SODIUM CHLORIDE 0.9 % IV SOLN: 100.0000 mL | INTRAVENOUS | Status: DC | PRN

## 2018-08-24 MED ORDER — MIDAZOLAM HCL 2 MG/2ML IJ SOLN
INTRAMUSCULAR | Status: AC | PRN
  Administered 2018-08-24: 1 mg via INTRAVENOUS
  Administered 2018-08-24 (×2): 0.5 mg via INTRAVENOUS
  Administered 2018-08-24: 1 mg via INTRAVENOUS

## 2018-08-24 MED ORDER — HEPARIN SODIUM (PORCINE) 1000 UNIT/ML IJ SOLN
INTRAMUSCULAR | Status: AC
  Filled 2018-08-24: qty 1

## 2018-08-24 MED ORDER — MIDAZOLAM HCL 2 MG/2ML IJ SOLN
INTRAMUSCULAR | Status: AC
  Filled 2018-08-24: qty 6

## 2018-08-24 MED ORDER — ALTEPLASE 2 MG IJ SOLR
2.0000 mg | Freq: Once | INTRAMUSCULAR | Status: DC | PRN
  Filled 2018-08-24: qty 2

## 2018-08-24 MED ORDER — FENTANYL CITRATE (PF) 100 MCG/2ML IJ SOLN
INTRAMUSCULAR | Status: AC
  Filled 2018-08-24: qty 4

## 2018-08-24 MED ORDER — HEPARIN SODIUM (PORCINE) 1000 UNIT/ML DIALYSIS: 20.0000 [IU]/kg | INTRAMUSCULAR | Status: DC | PRN

## 2018-08-24 MED ORDER — PENTAFLUOROPROP-TETRAFLUOROETH EX AERO: 1.0000 "application " | INHALATION_SPRAY | CUTANEOUS | Status: DC | PRN

## 2018-08-24 MED ORDER — LIDOCAINE-PRILOCAINE 2.5-2.5 % EX CREA: 1.0000 "application " | TOPICAL_CREAM | CUTANEOUS | Status: DC | PRN

## 2018-08-24 MED ORDER — IOPAMIDOL (ISOVUE-300) INJECTION 61%
INTRAVENOUS | Status: AC
  Administered 2018-08-24: 75 mL
  Filled 2018-08-24: qty 100

## 2018-08-24 MED ORDER — CALCITRIOL 0.5 MCG PO CAPS
ORAL_CAPSULE | ORAL | Status: AC
  Filled 2018-08-24: qty 4

## 2018-08-24 MED ORDER — HEPARIN SODIUM (PORCINE) 1000 UNIT/ML IJ SOLN
INTRAMUSCULAR | Status: AC | PRN
  Administered 2018-08-24: 3000 [IU] via INTRAVENOUS

## 2018-08-24 MED ORDER — FENTANYL CITRATE (PF) 100 MCG/2ML IJ SOLN
INTRAMUSCULAR | Status: AC | PRN
  Administered 2018-08-24 (×2): 25 ug via INTRAVENOUS
  Administered 2018-08-24: 50 ug via INTRAVENOUS

## 2018-08-24 MED ORDER — ALTEPLASE 2 MG IJ SOLR
INTRAMUSCULAR | Status: AC
  Administered 2018-08-24: 2 mg
  Filled 2018-08-24: qty 2

## 2018-08-24 MED ORDER — LIDOCAINE HCL 1 % IJ SOLN
INTRAMUSCULAR | Status: AC | PRN
  Administered 2018-08-24: 10 mL

## 2018-08-24 NOTE — Progress Notes (Signed)
Documentation of phone call:   Rec'd call from on call dialysis RN Bo.  Patient was expressing concern for her cannulating his AVG due to recent issues he has had with the AVG being accessed.  Required thrombectomy today in IR.   The phone was then passed to Mr. Dumond who related in detail events of the past several weeks of his life.  This ended in his insistence that a particular RN that he trusts to cannulate his AVG be called in to perform his dialysis this evening at 7:50pm.  I explained this was not an option and that the RN there is a professional who is trusted to cannulate his AVG.  He was extremely displeased with this option. He certainly has the right to refuse treatment options, however it may not be optimal for his health.  I let the RN know that it was his choice whether or not he underwent his dialysis treatment tonight but my recommendation is that he do so.   He repeatedly took down my name and noted that he was recording information with his iphone to document.    [name] [name]

## 2018-08-24 NOTE — Sedation Documentation (Signed)
Patient is resting comfortably. 

## 2018-08-24 NOTE — Sedation Documentation (Signed)
Patient is resting comfortably. 

## 2018-08-24 NOTE — Progress Notes (Addendum)
[facility] Progress Note   Dialysis Orders: [facility] MWF 4 hrs 180NRe 400/800 75.5 kg 2.0 K/ 2.0 Ca  L AVG -Heparin 5000 units IV TIW -Calcitriol 2.5 mcg PO TIW Last Mircera 50 in October  Assessment/Plan: 1.L Foot ulcer with osteomyelitis: S/P 5th toe amp and wound vac 11/21. S/p wound closure 11/29 - pt declined VAC - for zosyn here to change to Augmentin at d/c  2. ESRD -MWF via AVG. HD 11/24 last - clotted yesterday - referred for IR declot today- may need to be referred back to [facility] for additional access planning but not until foot wound healed. K 5.1 HD orders written for today 3. Anemia -HGB 87.9 . Persistent downward trend. Increase Aranesp dose to 100 mcg IV - was NOT on ESA prior to admission hgb had been in 11s. 4. Secondary hyperparathyroidism -Continue binders, VDRA. Ca 9.0 C Ca 10.4 Decrease VDRA. 5. HTN/volume -BP higher than usual HD 11/24 on holiday schedule.  Net UF 5 liters. Outpatient records states that he may leave with standing SBP >80.  BP significantly higher here likely due to volume and not standing 6. Nutrition -Albumin low Renal/Carb mod diet, add prostat, renal vits. 7. DMT1-per primary  8. Clotted AVGG - was clotted 11/18 - s/p declot at [facility] with patent centrals came back to dialysis 11/20 with low BFR only 200 but apparently able to run successfully 11/24 9. Disp - ok for d/c if able to do dialysis after declot today   Myriam Jacobson, PA-C [place] [phone] 08/24/2018,11:11 AM  LOS: 7 days   Pt seen, examined and agree w A/P as above.  Kelly Splinter MD [name] pager [phone]   08/24/2018, 12:17 PM    Subjective:   Wants to go home for Thanksgiving  Objective Vitals:   08/23/18 1820 08/23/18 2235 08/24/18 0621 08/24/18 0736  BP: 126/81 (!) 143/75 (!) 142/81 (!) 152/84  Pulse: 93 93 92 87  Resp: 18 18 19 18   Temp: 98.2 F (36.8 C) 98 F (36.7 C) 98 F (36.7 C) 98.1 F (36.7  C)  TempSrc: Oral Oral Oral Oral  SpO2: 100% 100% 99% 97%  Weight:  76.3 kg    Height:       Physical Exam General: NAD very talkative, anxious Heart: RRR Lungs:  No rales Abdomen: soft NTND + BS Extremities: no LE edema left foot ACE wrap  Dialysis Access:  Left AVGG clotted   Additional Objective Labs: Basic Metabolic Panel: Recent Labs  Lab 08/18/18 0719  08/20/18 0459 08/21/18 0731 08/22/18 0816  NA 131*   < > 134* 132* 134*  K 4.7   < > 4.2 5.0 4.5  CL 92*   < > 96* 96* 98  CO2 25   < > 26 25 27   GLUCOSE 188*   < > 84 221* 250*  BUN 47*   < > 26* 37* 23*  CREATININE 10.45*   < > 7.71* 8.90* 7.29*  CALCIUM 8.8*   < > 8.9 8.6* 9.0  PHOS 4.9*  --   --  6.4*  --    < > = values in this interval not displayed.   Liver Function Tests: Recent Labs  Lab 08/18/18 0719 08/21/18 0731  ALBUMIN 2.6* 2.3*   No results for input(s): LIPASE, AMYLASE in the last 168 hours. CBC: Recent Labs  Lab 08/19/18 0534 08/20/18 0459 08/21/18 0800 08/22/18 0816 08/24/18 1014  WBC 17.9* 9.9 15.8* 14.9* 13.9*  NEUTROABS  --   --   --   --  10.2*  HGB 8.3* 10.8* 8.0* 8.0* 7.9*  HCT 26.4* 35.2* 26.2* 26.7* 26.8*  MCV 93.0 94.4 96.0 96.0 96.1  PLT 237 238 291 301 380   Blood Culture    Component Value Date/Time   SDES TISSUE LEFT 5TH 08/18/2018 1401   SPECREQUEST NONE 08/18/2018 1401   CULT (A) 08/18/2018 1401    MULTIPLE ORGANISMS PRESENT, NONE PREDOMINANT FEW BACTEROIDES VULGATUS BETA LACTAMASE NEGATIVE Performed at [facility] [address], [place], [place] [address]    REPTSTATUS 08/23/2018 FINAL 08/18/2018 1401    Cardiac Enzymes: No results for input(s): CKTOTAL, CKMB, CKMBINDEX, TROPONINI in the last 168 hours. CBG: Recent Labs  Lab 08/23/18 1145 08/23/18 1325 08/23/18 1807 08/23/18 2234 08/24/18 0736  GLUCAP 255* 229* 184* 96 91   Iron Studies: No results for input(s): IRON, TIBC, TRANSFERRIN, FERRITIN in the last 72 hours. Lab Results   Component Value Date   INR 1.08 08/24/2018   INR 1.00 02/05/2018   INR 1.16 10/25/2017   Studies/Results: No results found. Medications: . sodium chloride 10 mL/hr at 08/20/18 0948  . ampicillin-sulbactam (UNASYN) IV 3 g (08/24/18 0625)   . amLODipine  10 mg Oral Daily  . calcitRIOL  2.25 mcg Oral Q M,W,F-HD  . Chlorhexidine Gluconate Cloth  6 each Topical Q0600  . cinacalcet  30 mg Oral Q supper  . collagenase  1 application Topical Daily  . [START ON 08/26/2018] darbepoetin (ARANESP) injection - DIALYSIS  100 mcg Intravenous Q Fri-HD  . feeding supplement (PRO-STAT SUGAR FREE 64)  30 mL Oral BID  . heparin injection (subcutaneous)  5,000 Units Subcutaneous Q8H  . insulin aspart  0-5 Units Subcutaneous QHS  . insulin aspart  0-9 Units Subcutaneous TID WC  . insulin aspart  5 Units Subcutaneous TID WC  . insulin glargine  15 Units Subcutaneous QHS  . metoCLOPramide  5 mg Oral TID AC  . sevelamer carbonate  3,200 mg Oral TID WC

## 2018-08-24 NOTE — Procedures (Signed)
Successful declot of left arm AV graft with thrombolysis, thrombectomy and angioplasty.  Minimal blood loss and no immediate complication.  Sutures to be removed at next dialysis session.  See full report in Imaging.

## 2018-08-24 NOTE — Progress Notes (Signed)
PROGRESS NOTE    [name]  CHY:[id number] DOB: [birth year] DOA: 08/17/2018 PCP: Mauricia Area, MD    Brief Narrative:  Patient is a 37 year old male with hypertension, ESRD on hemodialysis MWF, uncontrolled diabetes type 1, chronic diabetic left foot ulcer, was sent from podiatry office to the hospital on 11/20 for worsening of the left foot ulcer.  Patient had noted strong malodor with heavy drainage from the ulcer.  He had been using Santyl on the wound and last clinic visit or was on 11/15 and had been on clindamycin twice daily.  Patient admitted with infected left foot, he went for left fifth metatarsal and great toe partial amputation by podiatry, seen by ID for antibiotics recommendation, patient discharged 08/23/2018 was held secondary to clotted AV graft.   Assessment & Plan:   Principal Problem:   Diabetic foot infection ([place]) Active Problems:   Diabetes mellitus type 1 ([name])   Diabetic gastroparesis ([place])   HTN (hypertension)   Cellulitis and abscess of foot   ESRD (end stage renal disease) ([place])   Anemia of chronic disease   Acute osteomyelitis of left ankle or foot ([name])   Osteomyelitis of fifth toe of left foot ([name])   Diabetic ulcer of left midfoot associated with diabetes mellitus due to underlying condition, with necrosis of bone ([place])   Encounter for planned post-operative wound closure   Osteomyelitis of foot, left, acute ([place])  Left diabetic foot infection ([place]) with gangrene of the left foot -Patient presented with leukocytosis of 15.3, heavy drainage from his ulcer on the left foot  -MRI of the left foot showed cellulitis with osteomyelitis of the fifth metatarsal head, base of fifth proximal phalanx with severe surrounding edema. -Went back to the OR on 08/20/2018, underwent bone biopsy, metatarsal resection, secondary closure of surgical wound and wound VAC placement  -Wound cultures from 11/21 showing, with a few bacteroid vulgaris, beta-lactamase  negative, sensitivity pending , continue Zosyn.  Vancomycin discontinued on 11/22 ,discussed antibiotics with ID, recommendation for 4 weeks of Augmentin on discharge, to be kept on Zosyn during hospital stay. -Podiatry service, Dr. March Rummage was consulted, patient underwent I&D of the left fifth toe and transmetatarsal with partial left fifth toe and metatarsal amputation with wound VAC on 11/21, currently wound VAC has been discontinued, discussed with Dr. March Rummage, patient be discharged with no wound VAC, to follow in his office regarding further wound recommendation.    Diabetes mellitus type 1 ([place]), noncompliant, uncontrolled, insulin-dependent, with hyperglycemia, severely noncompliant -Hemoglobin A1c 7.3 (08/18/2018) -Patient has been recommended strongly not to take his own insulin, please see detailed note on 08/19/2018.  Refuses carb modified diet. -BG of 91 this morning.  Patient however currently n.p.o. in anticipation of declot of AV graft.  Continue current regimen of sliding scale insulin, pre-meal NovoLog and Lantus 15 units daily.  Outpatient follow-up.    Diabetic gastroparesis ([name]) -Continue Zofran as needed, Reglan    HTN (hypertension) -Continue Norvasc.     ESRD (end stage renal disease) ([place]) on hemodialysis/clotted AV graft -Nephrology consulted, on HD MWF, receiving HD per his schedule -Patient with clotted AV graft, was unable to be dialyzed yesterday, plan for IR to declot AV graft today which is currently pending.  After graft has been declotted patient likely to go for hemodialysis this evening.  Per nephrology.  Will likely need to follow-up with vascular surgery in the outpatient setting for evaluation for additional access planning once wound on foot is healed.  Anemia of chronic disease -Hemoglobin currently stable at 7.9.  No overt bleeding.  Follow H&H.    History of bipolar disorder, noncompliance -Currently stable.  Patient has varying moods noted per  prior physician from [name] to use of profanity and being hostile.  Continue current regimen of Klonopin.    DVT prophylaxis: Heparin Code Status: Full Family Communication: Updated patient.  No family at bedside. Disposition Plan: Likely home once AV graft has been declotted and patient has received hemodialysis likely tomorrow.   Consultants:   Interventional radiology  Podiatry: Dr. March Rummage 08/18/2018  Nephrology: Dr. Milus Banister 08/18/2018  Infectious disease: Dr. Johnnye Sima 08/20/2018    Procedures:   Plain films of the left foot 08/19/2018, 08/20/2018  MRI left foot 08/18/2018  Declotting AV fistula pending per IR 08/24/2018  Secondary closure of surgical wound/metatarsal resection/bone biopsy/application of wound VAC per Dr. March Rummage podiatry 08/20/2018  Antimicrobials:   IV Zosyn 08/17/2018  IV Unasyn 08/23/2018>>>> 08/24/2018   Subjective: Patient seems frustrated this morning.  Denies any chest pain.  Denies any acute shortness of breath however feels he is slowly getting volume overloaded.  Hoping to have gone home yesterday after hemodialysis however due to clot of AV graft in house.    Objective: Vitals:   08/23/18 1820 08/23/18 2235 08/24/18 0621 08/24/18 0736  BP: 126/81 (!) 143/75 (!) 142/81 (!) 152/84  Pulse: 93 93 92 87  Resp: 18 18 19 18   Temp: 98.2 F (36.8 C) 98 F (36.7 C) 98 F (36.7 C) 98.1 F (36.7 C)  TempSrc: Oral Oral Oral Oral  SpO2: 100% 100% 99% 97%  Weight:  76.3 kg    Height:        Intake/Output Summary (Last 24 hours) at 08/24/2018 1126 Last data filed at 08/24/2018 0856 Gross per 24 hour  Intake 640 ml  Output 0 ml  Net 640 ml   Filed Weights   08/21/18 2058 08/22/18 2059 08/23/18 2235  Weight: 76.2 kg 76.3 kg 76.3 kg    Examination:  General exam: Appears calm and comfortable  Respiratory system: Clear to auscultation. Respiratory effort normal. Cardiovascular system: S1 & S2 heard, RRR. No JVD, murmurs, rubs,  gallops or clicks. No pedal edema. Gastrointestinal system: Abdomen is nondistended, soft and nontender. No organomegaly or masses felt. Normal bowel sounds heard. Central nervous system: Alert and oriented. No focal neurological deficits. Extremities: Left foot wrapped.  Symmetric 5 x 5 power. Skin: No rashes, lesions or ulcers Psychiatry: Judgement and insight appear normal. Mood & affect appropriate.     Data Reviewed: I have personally reviewed following labs and imaging studies  CBC: Recent Labs  Lab 08/19/18 0534 08/20/18 0459 08/21/18 0800 08/22/18 0816 08/24/18 1014  WBC 17.9* 9.9 15.8* 14.9* 13.9*  NEUTROABS  --   --   --   --  10.2*  HGB 8.3* 10.8* 8.0* 8.0* 7.9*  HCT 26.4* 35.2* 26.2* 26.7* 26.8*  MCV 93.0 94.4 96.0 96.0 96.1  PLT 237 238 291 301 952   Basic Metabolic Panel: Recent Labs  Lab 08/18/18 0719 08/19/18 0534 08/20/18 0459 08/21/18 0731 08/22/18 0816 08/24/18 1014  NA 131* 129* 134* 132* 134* 132*  K 4.7 5.4* 4.2 5.0 4.5 5.1  CL 92* 91* 96* 96* 98 97*  CO2 25 25 26 25 27 22   GLUCOSE 188* 289* 84 221* 250* 127*  BUN 47* 58* 26* 37* 23* 42*  CREATININE 10.45* 12.34* 7.71* 8.90* 7.29* 10.88*  CALCIUM 8.8* 8.6* 8.9 8.6* 9.0 9.1  PHOS 4.9*  --   --  6.4*  --   --    GFR: Estimated Creatinine Clearance: 8.3 mL/min (A) (by C-G formula based on SCr of 10.88 mg/dL (H)). Liver Function Tests: Recent Labs  Lab 08/18/18 0719 08/21/18 0731  ALBUMIN 2.6* 2.3*   No results for input(s): LIPASE, AMYLASE in the last 168 hours. No results for input(s): AMMONIA in the last 168 hours. Coagulation Profile: Recent Labs  Lab 08/24/18 1014  INR 1.08   Cardiac Enzymes: No results for input(s): CKTOTAL, CKMB, CKMBINDEX, TROPONINI in the last 168 hours. BNP (last 3 results) No results for input(s): PROBNP in the last 8760 hours. HbA1C: No results for input(s): HGBA1C in the last 72 hours. CBG: Recent Labs  Lab 08/23/18 1145 08/23/18 1325  08/23/18 1807 08/23/18 2234 08/24/18 0736  GLUCAP 255* 229* 184* 96 91   Lipid Profile: No results for input(s): CHOL, HDL, LDLCALC, TRIG, CHOLHDL, LDLDIRECT in the last 72 hours. Thyroid Function Tests: No results for input(s): TSH, T4TOTAL, FREET4, T3FREE, THYROIDAB in the last 72 hours. Anemia Panel: No results for input(s): VITAMINB12, FOLATE, FERRITIN, TIBC, IRON, RETICCTPCT in the last 72 hours. Sepsis Labs: No results for input(s): PROCALCITON, LATICACIDVEN in the last 168 hours.  Recent Results (from the past 240 hour(s))  MRSA PCR Screening     Status: None   Collection Time: 08/18/18  2:28 AM  Result Value Ref Range Status   MRSA by PCR NEGATIVE NEGATIVE Final    Comment:        The GeneXpert MRSA Assay (FDA approved for NASAL specimens only), is one component of a comprehensive MRSA colonization surveillance program. It is not intended to diagnose MRSA infection nor to guide or monitor treatment for MRSA infections. Performed at [facility], [place] [address] [place], [place] [address]   Surgical pcr screen     Status: None   Collection Time: 08/18/18 10:08 AM  Result Value Ref Range Status   MRSA, PCR NEGATIVE NEGATIVE Final   Staphylococcus aureus NEGATIVE NEGATIVE Final    Comment: (NOTE) The Xpert SA Assay (FDA approved for NASAL specimens in patients 50 years of age and older), is one component of a comprehensive surveillance program. It is not intended to diagnose infection nor to guide or monitor treatment. Performed at [facility], [place] [address] [place], [place] [address]   Fungus Culture With Stain     Status: None (Preliminary result)   Collection Time: 08/18/18  2:00 PM  Result Value Ref Range Status   Fungus Stain Final report  Final    Comment: (NOTE) Performed At: [facility] [place], [place] [id number] Rush Farmer MD [id number]    Fungus (Mycology) Culture PENDING  Incomplete   Fungal Source  BONE  Final    Comment: LEFT 5TH Performed at [facility], [place] [address] [place], [place] [address]   Aerobic/Anaerobic Culture (surgical/deep wound)     Status: Abnormal   Collection Time: 08/18/18  2:00 PM  Result Value Ref Range Status   Specimen Description BONE LEFT 5TH  Final   Special Requests NONE  Final   Gram Stain   Final    RARE WBC PRESENT, PREDOMINANTLY PMN FEW GRAM NEGATIVE RODS    Culture (A)  Final    MULTIPLE ORGANISMS PRESENT, NONE PREDOMINANT RARE BACTEROIDES VULGATUS BETA LACTAMASE NEGATIVE Performed at [facility], [place] [address] [place], [place] [address]    Report  Status 08/23/2018 FINAL  Final  Fungus Culture Result     Status: None   Collection Time: 08/18/18  2:00 PM  Result Value Ref Range Status   Result 1 Comment  Final    Comment: (NOTE) KOH/Calcofluor preparation:  no fungus observed. Performed At: [facility] [place], [place] [id number] Rush Farmer MD [id number]   Fungus Culture With Stain     Status: None (Preliminary result)   Collection Time: 08/18/18  2:01 PM  Result Value Ref Range Status   Fungus Stain Final report  Final    Comment: (NOTE) Performed At: [facility] [place], [place] [id number] Rush Farmer MD [id number]    Fungus (Mycology) Culture PENDING  Incomplete   Fungal Source TISSUE  Final    Comment: LEFT 5TH Performed at [facility], [place] [address], [place], [place] [address]   Aerobic/Anaerobic Culture (surgical/deep wound)     Status: Abnormal   Collection Time: 08/18/18  2:01 PM  Result Value Ref Range Status   Specimen Description TISSUE LEFT 5TH  Final   Special Requests NONE  Final   Gram Stain NO WBC SEEN FEW GRAM NEGATIVE RODS   Final   Culture (A)  Final    MULTIPLE ORGANISMS PRESENT, NONE PREDOMINANT FEW BACTEROIDES VULGATUS BETA LACTAMASE NEGATIVE Performed at [facility], [place] [address], [place], [place]  [address]    Report Status 08/23/2018 FINAL  Final  Fungus Culture Result     Status: None   Collection Time: 08/18/18  2:01 PM  Result Value Ref Range Status   Result 1 Comment  Final    Comment: (NOTE) KOH/Calcofluor preparation:  no fungus observed. Performed At: [facility] [place], [place] [id number] Rush Farmer MD [id number]          Radiology Studies: No results found.      Scheduled Meds: . amLODipine  10 mg Oral Daily  . calcitRIOL  2.25 mcg Oral Q M,W,F-HD  . Chlorhexidine Gluconate Cloth  6 each Topical Q0600  . cinacalcet  30 mg Oral Q supper  . collagenase  1 application Topical Daily  . [START ON 08/26/2018] darbepoetin (ARANESP) injection - DIALYSIS  100 mcg Intravenous Q Fri-HD  . feeding supplement (PRO-STAT SUGAR FREE 64)  30 mL Oral BID  . heparin injection (subcutaneous)  5,000 Units Subcutaneous Q8H  . insulin aspart  0-5 Units Subcutaneous QHS  . insulin aspart  0-9 Units Subcutaneous TID WC  . insulin aspart  5 Units Subcutaneous TID WC  . insulin glargine  15 Units Subcutaneous QHS  . metoCLOPramide  5 mg Oral TID AC  . sevelamer carbonate  3,200 mg Oral TID WC   Continuous Infusions: . sodium chloride 10 mL/hr at 08/20/18 0948  . ampicillin-sulbactam (UNASYN) IV 3 g (08/24/18 0625)     LOS: 7 days    Time spent: 35 minutes    Irine Seal, MD [facility] Pager [phone]  If 7PM-7AM, please contact night-coverage [email or web address] Password [facility] 08/24/2018, 11:26 AM

## 2018-08-24 NOTE — Plan of Care (Signed)
  Problem: Coping: Goal: Level of anxiety will decrease Outcome: Progressing   Problem: Pain Managment: Goal: General experience of comfort will improve Outcome: Progressing   

## 2018-08-24 NOTE — Sedation Documentation (Signed)
Assumed care of pt

## 2018-08-25 DIAGNOSIS — I10 Essential (primary) hypertension: Secondary | ICD-10-CM

## 2018-08-25 LAB — GLUCOSE, CAPILLARY
Glucose-Capillary: 162 mg/dL — ABNORMAL HIGH (ref 70–99)
Glucose-Capillary: 179 mg/dL — ABNORMAL HIGH (ref 70–99)
Glucose-Capillary: 206 mg/dL — ABNORMAL HIGH (ref 70–99)
Glucose-Capillary: 295 mg/dL — ABNORMAL HIGH (ref 70–99)

## 2018-08-25 MED ORDER — AMOXICILLIN-POT CLAVULANATE 500-125 MG PO TABS: 1.0000 | ORAL_TABLET | Freq: Every day | ORAL | 0 refills | Status: AC

## 2018-08-25 MED ORDER — OXYCODONE-ACETAMINOPHEN 5-325 MG PO TABS: 1.0000 | ORAL_TABLET | Freq: Four times a day (QID) | ORAL | 0 refills | Status: DC | PRN

## 2018-08-25 NOTE — Discharge Summary (Signed)
Physician Discharge Summary  [name] IWP:[id number] DOB: Apr 30, 1981 DOA: 08/17/2018  PCP: Mauricia Area, MD  Admit date: 08/17/2018 Discharge date: 08/25/2018  Time spent: 60 minutes  Recommendations for Outpatient Follow-up:  1. Follow-up at hemodialysis center on Friday, 08/26/2018. 2. Follow-up with [name], [name], MD in 2 weeks.  On follow-up patient's diabetes will need to be reassessed. 3. Follow-up with Dr. Hardie Pulley, podiatry early next week.   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 [name] is a 37 y.o. male with medical history significant of hypertension, ESRD on hemodialysis, uncontrolled type 1 diabetes with a chronic diabetic left foot ulcer being sent here to the hospital from podiatry office.  Patient was seen at the office on the day of admission for acute changes to his left foot diabetic ulcer.  Noted to have a strong malodor with heavy drainage.  He was seen by Dr. Amalia Hailey who advised the patient to come into the hospital as a direct admit.  Patient will need surgical I&D and likely partial foot amputation.  He recommended starting IV  antibiotics upon admission.  Dr. March Rummage will see the patient during the hospitalization.  Patient had been using Santyl on the wound.  Last clinic visit with Dr. March Rummage was on November 15; had been on clindamycin twice daily.  Patient stated he noticed his left foot was smelling very bad 2 days prior to admission.  He had also been experiencing pain in the foot.  Denied having any fevers or chills.  Stated he wanted a regular diet to eat otherwise he will walk out of his room and go to the cafeteria to buy food.  Adamantly refused to eat carb modified or renal diet.  Reported having 8 out of 10 intensity pain in his left foot.  No further history could be obtained from the patient.   Hospital Course:  Left diabetic foot infection ([place]) with gangrene of the left foot -Patient presented with leukocytosis of 15.3, heavy drainage from his ulcer on the left foot  -MRI of the left foot showed cellulitis with osteomyelitis of the fifth metatarsal head, base of fifth proximal phalanx with severe surrounding edema. -Went back to the OR on 08/20/2018, underwent bone biopsy, metatarsal resection, secondary closure of surgical wound and wound VAC placement  -Wound cultures from 11/21 showing, with a few bacteroid vulgaris, beta-lactamase negative, sensitivity pending , continue Zosyn. Vancomycin discontinued on 11/22 ,discussedantibiotics with ID, recommendation for 4 weeks of Augmentin on discharge, to be kept on Zosyn during hospital stay. -Podiatry service, Dr. March Rummage was consulted, patient underwent I&D of the left fifth toe and transmetatarsal with partial left  fifth toe and metatarsal amputation with wound VAC on 11/21,currently wound VAC has been discontinued, discussed with Dr. March Rummage, patient be discharged with no wound VAC, to follow in his office regarding further wound recommendation.  Patient will be discharged home on 4 more weeks of Augmentin and is to follow-up with podiatry in the outpatient  setting.  Diabetes mellitus type 1 ([place]), noncompliant, uncontrolled, insulin-dependent, with hyperglycemia, severely noncompliant -Hemoglobin A1c 7.3 (08/18/2018) -Patient has been recommended strongly not to take his own insulin, please see detailed note on 08/19/2018. Refused carb modified diet. -BG of 91 this morning.  Patient was maintained on sliding scale insulin, meal coverage NovoLog as well as Lantus 15 units daily.  Blood glucose levels remained mostly in the 150s to 200 range during the hospitalization.  Outpatient follow-up.   Diabetic gastroparesis ([place]) -Patient maintained on Zofran as needed as well as Reglan.   HTN (hypertension) -Patient maintained on home regimen of Norvasc.    ESRD (end stage renal disease) ([place]) on hemodialysis/clotted AV graft -Nephrology consulted, on HD MWF, receiving HD per his schedule -Patient with clotted AV graft, was unable to be dialyzed  on 08/23/2018.  Patient was seen in consultation by IR and underwent successful declot of AV graft  on 08/24/2018.  Patient subsequently underwent hemodialysis with no complications.  Patient was followed by nephrology throughout the hospitalization and patient will likely need to be referred back to [facility] for additional access evaluation/planning due to frequent re-clots.  Surgery unlikely to be done until current foot infection/wound is healed and treated.  Patient received hemodialysis during the hospitalization per nephrology.  Outpatient follow-up at hemodialysis center.    Anemia of chronic disease -Hemoglobin remained stable at 7.9.  No overt bleeding noted during the hospitalization.  Outpatient follow-up.    History of bipolar disorder, noncompliance -Remained stable during the hospitalization.  Patient noted to have varying moods noted per prior physician from [place] to use of profanity and being hostile.  Patient maintained on home regimen of Klonopin.   Procedures:  Plain films  of the left foot 08/19/2018, 08/20/2018  MRI left foot 08/18/2018  Declotting AV fistula pending per IR 08/24/2018  Secondary closure of surgical wound/metatarsal resection/bone biopsy/application of wound VAC per Dr. March Rummage podiatry 08/20/2018  Successful declot of left arm AV graft with thrombolysis, thrombectomy and angioplasty per Dr. Anselm Pancoast interventional radiology 08/24/2018   Consultations:  Interventional radiology  Podiatry: Dr. March Rummage 08/18/2018  Nephrology: Dr. Milus Banister 08/18/2018  Infectious disease: Dr. Johnnye Sima 08/20/2018  Discharge Exam: Vitals:   08/25/18 0046 08/25/18 0448  BP: (!) 148/44 (!) 153/73  Pulse: (!) 102 96  Resp: 16 16  Temp: 99.4 F (37.4 C) 98.8 F (37.1 C)  SpO2: 100% 100%    General: NAD Cardiovascular: RRR Respiratory: CTAB  Discharge Instructions   Discharge Instructions    Diet - low sodium heart healthy   Complete by:  As directed    Discharge instructions   Complete by:  As directed    Follow with Primary MD [name], [name], MD in 7 days    Activity: As tolerated with Full fall precautions use walker/cane & assistance as needed   Disposition Home    Diet: renal diet    For Heart failure patients - Check your Weight same time everyday, if you gain over 2 pounds, or you develop in leg swelling, experience more shortness of breath or chest pain, call your Primary MD immediately. Follow Cardiac Low Salt Diet and 1.5 lit/day fluid restriction.  On your next visit with your primary care physician please Get Medicines reviewed and adjusted.   Please request your Prim.MD to go over all Hospital Tests and Procedure/Radiological results at the follow up, please get all Hospital records sent to your Prim MD by signing hospital release before you go home.   If you experience worsening of your admission symptoms, develop shortness of breath, life threatening emergency, suicidal or homicidal thoughts you must seek medical attention  immediately by calling 911 or calling your MD immediately  if symptoms less severe.  You Must read complete instructions/literature along with all the possible adverse reactions/side effects for all the Medicines you take and that have been prescribed to you. Take any new Medicines after you have completely understood and accpet all the possible adverse reactions/side effects.   Do not drive, operating heavy machinery, perform activities at heights, swimming or participation in water activities or provide baby sitting services if your were admitted for syncope or siezures until you have seen by Primary MD or a Neurologist and advised to do so again.  Do not drive when taking Pain medications.    Do not take more than prescribed Pain, Sleep and Anxiety Medications  Special Instructions: If you have smoked or chewed Tobacco  in the last 2 yrs please stop smoking, stop any regular Alcohol  and or any Recreational drug use.  Wear Seat belts while driving.   Please note  You were cared for by a hospitalist during your hospital stay. If you have any questions about your discharge medications or the care you received while you were in the hospital after you are discharged, you can call the unit and asked to speak with the hospitalist on call if the hospitalist that took care of you is not available. Once you are discharged, your primary care physician will handle any further medical issues. Please note that NO REFILLS for any discharge medications will be authorized once you are discharged, as it is imperative that you return to your primary care physician (or establish a relationship with a primary care physician if you do not have one) for your aftercare needs so that they can reassess your need for medications and monitor your lab values.   Increase activity slowly   Complete by:  As directed      Allergies as of 08/25/2018   No Known Allergies     Medication List    STOP taking these  medications   clindamycin 300 MG capsule Commonly known as:  CLEOCIN     TAKE these medications   acetaminophen 325 MG tablet Commonly known as:  TYLENOL Take 2 tablets (650 mg total) by mouth every 6 (six) hours as needed for mild pain or headache (pain/headache). Can restart this after you are done with Oxycodone/Tylenol   amLODipine 10 MG tablet Commonly known as:  NORVASC Take 10 mg by mouth daily.   amoxicillin-clavulanate 500-125 MG tablet Commonly known as:  AUGMENTIN Take 1 tablet (500 mg total) by mouth daily for 28 days.   cinacalcet 30 MG tablet Commonly known as:  SENSIPAR Take 30 mg by mouth daily with supper.   clonazePAM 0.5 MG tablet Commonly known as:  KLONOPIN Take 0.5 mg by mouth daily as needed for anxiety (sleep).   cloNIDine 0.3 mg/24hr patch Commonly known as:  CATAPRES - Dosed in mg/24 hr APPLY ONE PATCH TOPICALLY ONCE A WEEK   collagenase ointment Commonly known as:  SANTYL Apply 1 application topically daily. What changed:  Another medication with the same name was added. Make sure you understand how and when to take each.   collagenase ointment Commonly known as:  SANTYL Apply 1 application topically daily. What changed:  You were already taking a medication with the same name, and this prescription was added. Make sure you understand how and when to take each.   feeding supplement (PRO-STAT SUGAR FREE 64) Liqd Take 30 mLs by mouth 2 (two) times daily.   insulin aspart 100 UNIT/ML injection Commonly known as:  novoLOG Inject 5 Units into the skin 2 (two) times daily with a meal.   insulin glargine 100 UNIT/ML injection Commonly known as:  LANTUS Inject 0.15 mLs (15 Units total) into the skin at bedtime. What changed:  how much to take   [employer] TEST STRIPS test strip Generic drug:  glucose blood by Other route 4 (four) times a day as needed.   lactobacillus acidophilus & bulgar chewable tablet Chew 1 tablet by mouth 3 (three) times  daily with meals.   losartan 50 MG tablet Commonly known as:  COZAAR Take 50 mg by mouth daily.   metoCLOPramide 5 MG tablet Commonly known as:  REGLAN TAKE ONE TABLET BY MOUTH THREE TIMES DAILY BEFORE MEAL(S)   ondansetron 4 MG disintegrating tablet Commonly known as:  ZOFRAN-ODT Take 1 tablet (4 mg total) by mouth every 8 (eight) hours as needed for nausea or vomiting.   oxyCODONE-acetaminophen 5-325 MG tablet Commonly known as:  PERCOCET/ROXICET Take 1 tablet by mouth every 6 (six) hours as needed.   sevelamer carbonate 800 MG tablet Commonly known as:  RENVELA Take 2,400-3,200 mg by mouth See admin instructions. Take 3 - 4 tablets (2400 mg -3200 mg) by mouth two times daily with meals      No Known Allergies Follow-up Information    Evelina Bucy, DPM. Schedule an appointment as soon as possible for a visit in 1 week(s).   Specialty:  Podiatry Why:  f/u early next week. Contact information: [address] [address] [phone]        HD Follow up on 08/26/2018.        Mauricia Area, MD. Schedule an appointment as soon as possible for a visit in 2 week(s).   Specialty:  Nephrology Contact information: [place] [address] [phone]            The results of significant diagnostics from this hospitalization (including imaging, microbiology, ancillary and laboratory) are listed below for reference.    Significant Diagnostic Studies: Mr Foot Left Wo Contrast  Result Date: 08/18/2018 CLINICAL DATA:  Diabetic foot ulcer along the lateral forefoot. EXAM: MRI OF THE LEFT FOOT WITHOUT CONTRAST TECHNIQUE: Multiplanar, multisequence MR imaging of the left was performed. No intravenous contrast was administered. COMPARISON:  None. FINDINGS: Bones/Joint/Cartilage Soft tissue wound along the plantar lateral aspect of the fifth MTP joint extending to the cortex. Cortical destruction with severe bone marrow edema in the base of the  fifth proximal phalanx and fifth metatarsal head most consistent with osteomyelitis. Mild marrow edema in the fifth middle phalanx which may be reactive versus secondary to osteomyelitis. No other marrow signal abnormality. No fracture or dislocation. Normal alignment. No joint effusion. Ligaments Lisfranc ligament is intact.  Collateral ligaments are intact. Muscles and Tendons Flexor, peroneal and extensor compartment tendons are intact. Mild muscle atrophy. Soft tissue No fluid collection or hematoma. No soft tissue mass. Soft tissue edema in the lateral forefoot extending into the  fifth digit most consistent with cellulitis. Small focus of low signal in the soft tissues along the medial aspect of the fifth digit likely reflecting a small amount of air. IMPRESSION: 1. Soft tissue wound along the plantar lateral aspect of the fifth MTP joint with surrounding cellulitis. Osteomyelitis of the fifth metatarsal head and base of the fifth proximal phalanx with severe surrounding marrow edema. Electronically Signed   By: Kathreen Devoid   On: 08/18/2018 10:08   Ir US Guide Vasc Access Left  Result Date: 08/24/2018 INDICATION: End-stage renal disease and clotted left upper extremity AV graft. According to the patient he has had multiple interventions on this graft. EXAM: DIALYSIS GRAFT DECLOT GRAFT / VENOUS PTA; ULTRASOUND GUIDANCE FOR VASCULAR ACCESS Physician: Stephan Minister. Anselm Pancoast, MD MEDICATIONS: None. ANESTHESIA/SEDATION: Heparin 3000 units, TPA 2 mg, Versed 3 mg, Fentanyl 100 mcg Moderate Sedation Time:  64 minutes The patient was continuously monitored during the procedure by the interventional radiology nurse under my direct supervision. FLUOROSCOPY TIME:  Fluoroscopy Time: 7 minutes 54 seconds (49 mGy). COMPLICATIONS: None immediate. PROCEDURE: The procedure was explained to the patient. The risks and benefits of the procedure were discussed and the patient's questions were addressed. Informed consent was obtained from  the patient. The left upper arm was prepped and draped in a sterile fashion. Maximal barrier sterile technique was utilized including caps, mask, sterile gowns, sterile gloves, sterile drape, hand hygiene and skin antiseptic. The skin was anesthetized with 1% lidocaine. The graft was accessed using 21 gauge needles towards the venous and arterial anastomoses with ultrasound guidance. Ultrasound images were obtained for documentation. Micropuncture catheters were placed. 2 mg of TPA was infused through the micropuncture catheters. The vascular access pointing towards the central veins was upsized to a 6-French vascular sheath. A 5-French catheter was advanced into the central venous structures and a central venogram was performed. Fluoroscopic images were saved for documentation. A Bentson wire was placed centrally and the graft and outflow vein were treated with the AngioJet thrombectomy device. The access pointing towards the arterial anastomosis was upsized to a 6-French sheath. A wire was advanced into the arterial system. The arterial plug was pulled using a 5 [place] Fogarty balloon. There was flow in the graft. Residual clot within the graft and near the venous anastomosis was treated with the Angiojet device. The venous anastomosis and outflow vein were angioplastied with a 6 mm x 40 mm Conquest balloon. Residual clot in the mid aspect of the graft was treated with a PTD thrombectomy device. Follow-up shuntogram images demonstrated that the graft was widely patent with mild irregularity at the outflow vein and near the venous anastomosis. This area was treated again with the 6 mm balloon and then a 7 mm x 40 mm Conquest balloon. Follow-up shuntogram images were obtained. The vascular sheaths were removed with purse string sutures. No immediate complication. FINDINGS: Occluded arc graft in the left upper arm. Patient already had overlying stents along the upper aspect of the graft and venous anastomosis.  Successful revascularization of the graft. After flow was re-established in the graft, there was noted to be small linear filling defects in the outflow vein near the stents. These were thought to represent residual thrombus or possibly related to non flow limiting dissection. These areas of irregularity markedly improved after balloon dilatation with a 7 mm balloon. Central veins are patent. Arterial anastomosis is widely patent. IMPRESSION: Successful declot of the left upper extremity graft. The left arm AV graft remains amenable  to percutaneous intervention. Electronically Signed   By: Markus Daft M.D.   On: 08/24/2018 19:21   Dg Foot 2 Views Left  Result Date: 08/20/2018 CLINICAL DATA:  S/p 5th metatarsal osteomyelitis resection. EXAM: LEFT FOOT - 2 VIEW COMPARISON:  08/19/2017 FINDINGS: Resection of a portion of the remaining 5th metatarsal noted. Soft tissue postoperative changes identified. No acute bony abnormality otherwise noted. Vascular calcifications are identified. IMPRESSION: Further resection of portion of the remaining 5th metatarsal. Electronically Signed   By: Margarette Canada M.D.   On: 08/20/2018 15:26   Dg Foot 2 Views Left  Result Date: 08/19/2018 CLINICAL DATA:  S/P IRRIGATION AND DEBRIDEMENT OF LEFT 5TH TOE AND TRANSMETATARSAL, WITH PARTICAL LEFT 5TH TOE AND METATARSAL AMPUTATION, BONE BIOPSY, WOUND VAC APPLICATION FROM YESTERDAY. EXAM: LEFT FOOT - 2 VIEW COMPARISON:  Plain film of the LEFT foot dated 08/11/2018. MRI of the LEFT foot dated 08/18/2018 FINDINGS: Surgical changes of fifth metatarsal amputation, with metatarsal base remaining. Expected postsurgical changes within the overlying soft tissues. No abnormal soft tissue air. Remaining osseous structures appear intact and normal in mineralization. IMPRESSION: Status post fifth metatarsal amputation. Expected postsurgical changes within the overlying soft tissues. No evidence of surgical complicating feature. Electronically Signed    By: Franki Cabot M.D.   On: 08/19/2018 19:34   Dg Foot Complete Left  Result Date: 08/11/2018 Please see detailed radiograph report in office note.  Dg Foot Complete Left  Result Date: 08/04/2018 Please see detailed radiograph report in office note.  Ir Thrombectomy Av Fistula W/thrombolysis Inc/shunt/img Left  Result Date: 08/24/2018 INDICATION: End-stage renal disease and clotted left upper extremity AV graft. According to the patient he has had multiple interventions on this graft. EXAM: DIALYSIS GRAFT DECLOT GRAFT / VENOUS PTA; ULTRASOUND GUIDANCE FOR VASCULAR ACCESS Physician: Stephan Minister. Anselm Pancoast, MD MEDICATIONS: None. ANESTHESIA/SEDATION: Heparin 3000 units, TPA 2 mg, Versed 3 mg, Fentanyl 100 mcg Moderate Sedation Time:  64 minutes The patient was continuously monitored during the procedure by the interventional radiology nurse under my direct supervision. FLUOROSCOPY TIME:  Fluoroscopy Time: 7 minutes 54 seconds (49 mGy). COMPLICATIONS: None immediate. PROCEDURE: The procedure was explained to the patient. The risks and benefits of the procedure were discussed and the patient's questions were addressed. Informed consent was obtained from the patient. The left upper arm was prepped and draped in a sterile fashion. Maximal barrier sterile technique was utilized including caps, mask, sterile gowns, sterile gloves, sterile drape, hand hygiene and skin antiseptic. The skin was anesthetized with 1% lidocaine. The graft was accessed using 21 gauge needles towards the venous and arterial anastomoses with ultrasound guidance. Ultrasound images were obtained for documentation. Micropuncture catheters were placed. 2 mg of TPA was infused through the micropuncture catheters. The vascular access pointing towards the central veins was upsized to a 6-French vascular sheath. A 5-French catheter was advanced into the central venous structures and a central venogram was performed. Fluoroscopic images were saved for  documentation. A Bentson wire was placed centrally and the graft and outflow vein were treated with the AngioJet thrombectomy device. The access pointing towards the arterial anastomosis was upsized to a 6-French sheath. A wire was advanced into the arterial system. The arterial plug was pulled using a 5 [place] Fogarty balloon. There was flow in the graft. Residual clot within the graft and near the venous anastomosis was treated with the Angiojet device. The venous anastomosis and outflow vein were angioplastied with a 6 mm x 40 mm Conquest balloon. Residual  clot in the mid aspect of the graft was treated with a PTD thrombectomy device. Follow-up shuntogram images demonstrated that the graft was widely patent with mild irregularity at the outflow vein and near the venous anastomosis. This area was treated again with the 6 mm balloon and then a 7 mm x 40 mm Conquest balloon. Follow-up shuntogram images were obtained. The vascular sheaths were removed with purse string sutures. No immediate complication. FINDINGS: Occluded arc graft in the left upper arm. Patient already had overlying stents along the upper aspect of the graft and venous anastomosis. Successful revascularization of the graft. After flow was re-established in the graft, there was noted to be small linear filling defects in the outflow vein near the stents. These were thought to represent residual thrombus or possibly related to non flow limiting dissection. These areas of irregularity markedly improved after balloon dilatation with a 7 mm balloon. Central veins are patent. Arterial anastomosis is widely patent. IMPRESSION: Successful declot of the left upper extremity graft. The left arm AV graft remains amenable to percutaneous intervention. Electronically Signed   By: Markus Daft M.D.   On: 08/24/2018 19:21    Microbiology: Recent Results (from the past 240 hour(s))  MRSA PCR Screening     Status: None   Collection Time: 08/18/18  2:28 AM   Result Value Ref Range Status   MRSA by PCR NEGATIVE NEGATIVE Final    Comment:        The GeneXpert MRSA Assay (FDA approved for NASAL specimens only), is one component of a comprehensive MRSA colonization surveillance program. It is not intended to diagnose MRSA infection nor to guide or monitor treatment for MRSA infections. Performed at [facility], [place] [address], [place], [place] [id number]   Surgical pcr screen     Status: None   Collection Time: 08/18/18 10:08 AM  Result Value Ref Range Status   MRSA, PCR NEGATIVE NEGATIVE Final   Staphylococcus aureus NEGATIVE NEGATIVE Final    Comment: (NOTE) The Xpert SA Assay (FDA approved for NASAL specimens in patients 55 years of age and older), is one component of a comprehensive surveillance program. It is not intended to diagnose infection nor to guide or monitor treatment. Performed at [facility], [place] [address], [place], [place] [address]   Fungus Culture With Stain     Status: None (Preliminary result)   Collection Time: 08/18/18  2:00 PM  Result Value Ref Range Status   Fungus Stain Final report  Final    Comment: (NOTE) Performed At: [facility] [place], [place] [id number] Rush Farmer MD [id number]    Fungus (Mycology) Culture PENDING  Incomplete   Fungal Source BONE  Final    Comment: LEFT 5TH Performed at [facility], [place] [address], [place], [place] [address]   Aerobic/Anaerobic Culture (surgical/deep wound)     Status: Abnormal   Collection Time: 08/18/18  2:00 PM  Result Value Ref Range Status   Specimen Description BONE LEFT 5TH  Final   Special Requests NONE  Final   Gram Stain   Final    RARE WBC PRESENT, PREDOMINANTLY PMN FEW GRAM NEGATIVE RODS    Culture (A)  Final    MULTIPLE ORGANISMS PRESENT, NONE PREDOMINANT RARE BACTEROIDES VULGATUS BETA LACTAMASE NEGATIVE Performed at [facility], [place] [address], [place], [place] [address]     Report Status 08/23/2018 FINAL  Final  Fungus Culture Result  Status: None   Collection Time: 08/18/18  2:00 PM  Result Value Ref Range Status   Result 1 Comment  Final    Comment: (NOTE) KOH/Calcofluor preparation:  no fungus observed. Performed At: [facility] [place], [place] [id number] Rush Farmer MD [id number]   Fungus Culture With Stain     Status: None (Preliminary result)   Collection Time: 08/18/18  2:01 PM  Result Value Ref Range Status   Fungus Stain Final report  Final    Comment: (NOTE) Performed At: [facility] [place], [place] [id number] Rush Farmer MD TO:[id number]    Fungus (Mycology) Culture PENDING  Incomplete   Fungal Source TISSUE  Final    Comment: LEFT 5TH Performed at [facility], [place] [address] [place], [place] [address]   Aerobic/Anaerobic Culture (surgical/deep wound)     Status: Abnormal   Collection Time: 08/18/18  2:01 PM  Result Value Ref Range Status   Specimen Description TISSUE LEFT 5TH  Final   Special Requests NONE  Final   Gram Stain NO WBC SEEN FEW GRAM NEGATIVE RODS   Final   Culture (A)  Final    MULTIPLE ORGANISMS PRESENT, NONE PREDOMINANT FEW BACTEROIDES VULGATUS BETA LACTAMASE NEGATIVE Performed at [facility], [place] [address], [place], [place] [address]    Report Status 08/23/2018 FINAL  Final  Fungus Culture Result     Status: None   Collection Time: 08/18/18  2:01 PM  Result Value Ref Range Status   Result 1 Comment  Final    Comment: (NOTE) KOH/Calcofluor preparation:  no fungus observed. Performed At: [facility] [place], [place] [id number] Rush Farmer MD [id number]      Labs: Basic Metabolic Panel: Recent Labs  Lab 08/19/18 0534 08/20/18 0459 08/21/18 0731 08/22/18 0816 08/24/18 1014  NA 129* 134* 132* 134* 132*  K 5.4* 4.2 5.0 4.5 5.1  CL 91* 96* 96* 98 97*  CO2 25 26 25 27 22   GLUCOSE 289* 84 221*  250* 127*  BUN 58* 26* 37* 23* 42*  CREATININE 12.34* 7.71* 8.90* 7.29* 10.88*  CALCIUM 8.6* 8.9 8.6* 9.0 9.1  PHOS  --   --  6.4*  --   --    Liver Function Tests: Recent Labs  Lab 08/21/18 0731  ALBUMIN 2.3*   No results for input(s): LIPASE, AMYLASE in the last 168 hours. No results for input(s): AMMONIA in the last 168 hours. CBC: Recent Labs  Lab 08/19/18 0534 08/20/18 0459 08/21/18 0800 08/22/18 0816 08/24/18 1014  WBC 17.9* 9.9 15.8* 14.9* 13.9*  NEUTROABS  --   --   --   --  10.2*  HGB 8.3* 10.8* 8.0* 8.0* 7.9*  HCT 26.4* 35.2* 26.2* 26.7* 26.8*  MCV 93.0 94.4 96.0 96.0 96.1  PLT 237 238 291 301 380   Cardiac Enzymes: No results for input(s): CKTOTAL, CKMB, CKMBINDEX, TROPONINI in the last 168 hours. BNP: BNP (last 3 results) No results for input(s): BNP in the last 8760 hours.  ProBNP (last 3 results) No results for input(s): PROBNP in the last 8760 hours.  CBG: Recent Labs  Lab 08/24/18 1125 08/24/18 1743 08/25/18 0043 08/25/18 0447 08/25/18 0725  GLUCAP 129* 178* 162* 179* 206*       Signed:  Irine Seal MD.  [facility] 08/25/2018, 11:21 AM

## 2018-08-25 NOTE — Progress Notes (Addendum)
[facility] Progress Note   Dialysis Orders: [facility] MWF 4 hrs 180NRe 400/800 75.5 kg 2.0 K/ 2.0 Ca  L AVG -Heparin 5000 units IV TIW -Calcitriol 2.5 mcg PO TIW Last Mircera 50 in October  Assessment/Plan: 1.L Foot ulcer with osteomyelitis: S/P 5th toe amp and wound vac 11/21. S/p wound closure 11/29 - pt declined VAC - for zosyn here to change to Augmentin at d/c  2. ESRD -MWF via AVG. Dialyzed last evening with net UF 4.5 L post wt 81.2 - still significantly above EDW but tolerating volulme. After d/c will refer back to [facility] for additional access evaluation/planning due to frequent reclots. Pt is aware surgery would not be done until current infection of foot wound healed. Ultimately had dialysis last evening- see Dr. Caprice Red note. 3. Anemia -HGB 7.9 . Persistent downward trend but stable x several days.. Increased Aranesp dose to 100 mcg IV - was NOT on ESA prior to admission hgb had been in 11s. 4. Secondary hyperparathyroidism -Continue binders, VDRA. Ca 9.0 C Ca 10.4 Decreased VDRA. 5. HTN/volume -BP higher than usual HD 11/24 on holiday schedule.  Net UF 4.5 liters. Outpatient records states that he may leave with standing SBP >80.  BP significantly higher here likely due to volume - plan next HD Friday - if still above EDW Friday at outpatient unit will offer an extra Saturday treatment there. 6. Nutrition -Albumin low Renal/Carb mod diet, add prostat, renal vits. 7. DMT1-per primary 8. Clotted AVGG - was clotted 11/18 - s/p declot at [facility] with patent centrals came back to dialysis 11/20 with low BFR only 200 but apparently able to run successfully 11/24.  Appreciate IR assistance with declot yesterday - ran well - wide open today - check access flow at next HD treatment- can remove retention suture at his home HD unit tomorrow 9. Disp - ok for d/c today to return to outpatient dialysis Friday.  Myriam Jacobson, PA-C [place] 08/25/2018,9:33 AM  LOS: 8 days   Pt seen, examined and agree w A/P as above.  Kelly Splinter MD [employer] pager [phone]   08/25/2018, 1:17 PM    Subjective:   C/o issues with dialysis last evening and not getting to eat full supper and dialysis running late so he didn't go home.    Objective Vitals:   08/24/18 2330 08/25/18 0000 08/25/18 0046 08/25/18 0448  BP: (!) 135/50 (!) 139/45 (!) 148/44 (!) 153/73  Pulse: 98 100 (!) 102 96  Resp: 18 18 16 16   Temp:  98.8 F (37.1 C) 99.4 F (37.4 C) 98.8 F (37.1 C)  TempSrc:  Oral Oral Oral  SpO2:  100% 100% 100%  Weight:  81.2 kg    Height:       Physical Exam General: NAD very talkative speech pressure Heart:RRR Lungs: no rales Abdomen: soft NT Extremities: no LE edema Dialysis Access:  Right upper AVGG + bruit - with retention suture   Additional Objective Labs: Basic Metabolic Panel: Recent Labs  Lab 08/21/18 0731 08/22/18 0816 08/24/18 1014  NA 132* 134* 132*  K 5.0 4.5 5.1  CL 96* 98 97*  CO2 25 27 22   GLUCOSE 221* 250* 127*  BUN 37* 23* 42*  CREATININE 8.90* 7.29* 10.88*  CALCIUM 8.6* 9.0 9.1  PHOS 6.4*  --   --    Liver Function Tests: Recent Labs  Lab 08/21/18 0731  ALBUMIN 2.3*   No results for input(s): LIPASE, AMYLASE  in the last 168 hours. CBC: Recent Labs  Lab 08/19/18 0534 08/20/18 0459 08/21/18 0800 08/22/18 0816 08/24/18 1014  WBC 17.9* 9.9 15.8* 14.9* 13.9*  NEUTROABS  --   --   --   --  10.2*  HGB 8.3* 10.8* 8.0* 8.0* 7.9*  HCT 26.4* 35.2* 26.2* 26.7* 26.8*  MCV 93.0 94.4 96.0 96.0 96.1  PLT 237 238 291 301 380   Blood Culture    Component Value Date/Time   SDES TISSUE LEFT 5TH 08/18/2018 1401   SPECREQUEST NONE 08/18/2018 1401   CULT (A) 08/18/2018 1401    MULTIPLE ORGANISMS PRESENT, NONE PREDOMINANT FEW BACTEROIDES VULGATUS BETA LACTAMASE NEGATIVE Performed at [place] [address], [place], [place] [address]    REPTSTATUS  08/23/2018 FINAL 08/18/2018 1401    Cardiac Enzymes: No results for input(s): CKTOTAL, CKMB, CKMBINDEX, TROPONINI in the last 168 hours. CBG: Recent Labs  Lab 08/24/18 1125 08/24/18 1743 08/25/18 0043 08/25/18 0447 08/25/18 0725  GLUCAP 129* 178* 162* 179* 206*   Iron Studies: No results for input(s): IRON, TIBC, TRANSFERRIN, FERRITIN in the last 72 hours. Lab Results  Component Value Date   INR 1.08 08/24/2018   INR 1.00 02/05/2018   INR 1.16 10/25/2017   Studies/Results: Ir US Guide Vasc Access Left  Result Date: 08/24/2018 INDICATION: End-stage renal disease and clotted left upper extremity AV graft. According to the patient he has had multiple interventions on this graft. EXAM: DIALYSIS GRAFT DECLOT GRAFT / VENOUS PTA; ULTRASOUND GUIDANCE FOR VASCULAR ACCESS Physician: Stephan Minister. Anselm Pancoast, MD MEDICATIONS: None. ANESTHESIA/SEDATION: Heparin 3000 units, TPA 2 mg, Versed 3 mg, Fentanyl 100 mcg Moderate Sedation Time:  64 minutes The patient was continuously monitored during the procedure by the interventional radiology nurse under my direct supervision. FLUOROSCOPY TIME:  Fluoroscopy Time: 7 minutes 54 seconds (49 mGy). COMPLICATIONS: None immediate. PROCEDURE: The procedure was explained to the patient. The risks and benefits of the procedure were discussed and the patient's questions were addressed. Informed consent was obtained from the patient. The left upper arm was prepped and draped in a sterile fashion. Maximal barrier sterile technique was utilized including caps, mask, sterile gowns, sterile gloves, sterile drape, hand hygiene and skin antiseptic. The skin was anesthetized with 1% lidocaine. The graft was accessed using 21 gauge needles towards the venous and arterial anastomoses with ultrasound guidance. Ultrasound images were obtained for documentation. Micropuncture catheters were placed. 2 mg of TPA was infused through the micropuncture catheters. The vascular access pointing  towards the central veins was upsized to a 6-French vascular sheath. A 5-French catheter was advanced into the central venous structures and a central venogram was performed. Fluoroscopic images were saved for documentation. A Bentson wire was placed centrally and the graft and outflow vein were treated with the AngioJet thrombectomy device. The access pointing towards the arterial anastomosis was upsized to a 6-French sheath. A wire was advanced into the arterial system. The arterial plug was pulled using a 5 [place] Fogarty balloon. There was flow in the graft. Residual clot within the graft and near the venous anastomosis was treated with the Angiojet device. The venous anastomosis and outflow vein were angioplastied with a 6 mm x 40 mm Conquest balloon. Residual clot in the mid aspect of the graft was treated with a PTD thrombectomy device. Follow-up shuntogram images demonstrated that the graft was widely patent with mild irregularity at the outflow vein and near the venous anastomosis. This area was treated again with the 6  mm balloon and then a 7 mm x 40 mm Conquest balloon. Follow-up shuntogram images were obtained. The vascular sheaths were removed with purse string sutures. No immediate complication. FINDINGS: Occluded arc graft in the left upper arm. Patient already had overlying stents along the upper aspect of the graft and venous anastomosis. Successful revascularization of the graft. After flow was re-established in the graft, there was noted to be small linear filling defects in the outflow vein near the stents. These were thought to represent residual thrombus or possibly related to non flow limiting dissection. These areas of irregularity markedly improved after balloon dilatation with a 7 mm balloon. Central veins are patent. Arterial anastomosis is widely patent. IMPRESSION: Successful declot of the left upper extremity graft. The left arm AV graft remains amenable to percutaneous intervention.  Electronically Signed   By: Markus Daft M.D.   On: 08/24/2018 19:21   Ir Thrombectomy Av Fistula W/thrombolysis Inc/shunt/img Left  Result Date: 08/24/2018 INDICATION: End-stage renal disease and clotted left upper extremity AV graft. According to the patient he has had multiple interventions on this graft. EXAM: DIALYSIS GRAFT DECLOT GRAFT / VENOUS PTA; ULTRASOUND GUIDANCE FOR VASCULAR ACCESS Physician: Stephan Minister. Anselm Pancoast, MD MEDICATIONS: None. ANESTHESIA/SEDATION: Heparin 3000 units, TPA 2 mg, Versed 3 mg, Fentanyl 100 mcg Moderate Sedation Time:  64 minutes The patient was continuously monitored during the procedure by the interventional radiology nurse under my direct supervision. FLUOROSCOPY TIME:  Fluoroscopy Time: 7 minutes 54 seconds (49 mGy). COMPLICATIONS: None immediate. PROCEDURE: The procedure was explained to the patient. The risks and benefits of the procedure were discussed and the patient's questions were addressed. Informed consent was obtained from the patient. The left upper arm was prepped and draped in a sterile fashion. Maximal barrier sterile technique was utilized including caps, mask, sterile gowns, sterile gloves, sterile drape, hand hygiene and skin antiseptic. The skin was anesthetized with 1% lidocaine. The graft was accessed using 21 gauge needles towards the venous and arterial anastomoses with ultrasound guidance. Ultrasound images were obtained for documentation. Micropuncture catheters were placed. 2 mg of TPA was infused through the micropuncture catheters. The vascular access pointing towards the central veins was upsized to a 6-French vascular sheath. A 5-French catheter was advanced into the central venous structures and a central venogram was performed. Fluoroscopic images were saved for documentation. A Bentson wire was placed centrally and the graft and outflow vein were treated with the AngioJet thrombectomy device. The access pointing towards the arterial anastomosis was  upsized to a 6-French sheath. A wire was advanced into the arterial system. The arterial plug was pulled using a 5 [place] Fogarty balloon. There was flow in the graft. Residual clot within the graft and near the venous anastomosis was treated with the Angiojet device. The venous anastomosis and outflow vein were angioplastied with a 6 mm x 40 mm Conquest balloon. Residual clot in the mid aspect of the graft was treated with a PTD thrombectomy device. Follow-up shuntogram images demonstrated that the graft was widely patent with mild irregularity at the outflow vein and near the venous anastomosis. This area was treated again with the 6 mm balloon and then a 7 mm x 40 mm Conquest balloon. Follow-up shuntogram images were obtained. The vascular sheaths were removed with purse string sutures. No immediate complication. FINDINGS: Occluded arc graft in the left upper arm. Patient already had overlying stents along the upper aspect of the graft and venous anastomosis. Successful revascularization of the graft. After flow was  re-established in the graft, there was noted to be small linear filling defects in the outflow vein near the stents. These were thought to represent residual thrombus or possibly related to non flow limiting dissection. These areas of irregularity markedly improved after balloon dilatation with a 7 mm balloon. Central veins are patent. Arterial anastomosis is widely patent. IMPRESSION: Successful declot of the left upper extremity graft. The left arm AV graft remains amenable to percutaneous intervention. Electronically Signed   By: Markus Daft M.D.   On: 08/24/2018 19:21   Medications: . sodium chloride 10 mL/hr at 08/20/18 0948  . sodium chloride    . sodium chloride    . ampicillin-sulbactam (UNASYN) IV 3 g (08/25/18 0457)   . amLODipine  10 mg Oral Daily  . calcitRIOL  2.25 mcg Oral Q M,W,F-HD  . cinacalcet  30 mg Oral Q supper  . collagenase  1 application Topical Daily  . [START ON  08/26/2018] darbepoetin (ARANESP) injection - DIALYSIS  100 mcg Intravenous Q Fri-HD  . feeding supplement (PRO-STAT SUGAR FREE 64)  30 mL Oral BID  . heparin injection (subcutaneous)  5,000 Units Subcutaneous Q8H  . insulin aspart  0-5 Units Subcutaneous QHS  . insulin aspart  0-9 Units Subcutaneous TID WC  . insulin aspart  5 Units Subcutaneous TID WC  . insulin glargine  15 Units Subcutaneous QHS  . metoCLOPramide  5 mg Oral TID AC  . sevelamer carbonate  3,200 mg Oral TID WC

## 2018-08-25 NOTE — Progress Notes (Signed)
Subjective:  Patient ID: [name], male    DOB: January 10, 1981,  MRN: [id number]  (Late entry)  Seen at bedside. Having AVG issues precluding discharge. Planned IR procedure today. Upset he can't eat again. Denies pedal issues.  Objective:   Vitals:   08/24/18 2230 08/24/18 2300  BP: 130/65 140/60  Pulse: 84 88  Resp:    Temp:    SpO2:     General AA&O x3. Normal mood and affect.  Vascular Foot warm and well perfused.  Neurologic Epicritic sensation grossly absent.  Dermatologic Wound healing well Incision slightly macerated but coapted with intact suture Continued granulation. No purulence. SS drainage only. No warmth or erythema. No signs of acute infection.   Orthopedic: Motor intact distally.   Results for orders placed or performed during the hospital encounter of 08/17/18 (from the past 24 hour(s))  Glucose, capillary     Status: None   Collection Time: 08/24/18  7:36 AM  Result Value Ref Range   Glucose-Capillary 91 70 - 99 mg/dL  Protime-INR     Status: None   Collection Time: 08/24/18 10:14 AM  Result Value Ref Range   Prothrombin Time 14.0 11.4 - 15.2 seconds   INR 4.62   Basic metabolic panel     Status: Abnormal   Collection Time: 08/24/18 10:14 AM  Result Value Ref Range   Sodium 132 (L) 135 - 145 mmol/L   Potassium 5.1 3.5 - 5.1 mmol/L   Chloride 97 (L) 98 - 111 mmol/L   CO2 22 22 - 32 mmol/L   Glucose, Bld 127 (H) 70 - 99 mg/dL   BUN 42 (H) 6 - 20 mg/dL   Creatinine, Ser 10.88 (H) 0.61 - 1.24 mg/dL   Calcium 9.1 8.9 - 10.3 mg/dL   GFR calc non Af Amer 5 (L) >60 mL/min   GFR calc Af Amer 6 (L) >60 mL/min   Anion gap 13 5 - 15  CBC with Differential/Platelet     Status: Abnormal   Collection Time: 08/24/18 10:14 AM  Result Value Ref Range   WBC 13.9 (H) 4.0 - 10.5 K/uL   RBC 2.79 (L) 4.22 - 5.81 MIL/uL   Hemoglobin 7.9 (L) 13.0 - 17.0 g/dL   HCT 26.8 (L) 39.0 - 52.0 %   MCV 96.1 80.0 - 100.0 fL   MCH 28.3 26.0 - 34.0 pg   MCHC 29.5 (L) 30.0 -  36.0 g/dL   RDW 14.8 11.5 - 15.5 %   Platelets 380 150 - 400 K/uL   nRBC 0.0 0.0 - 0.2 %   Neutrophils Relative % 73 %   Neutro Abs 10.2 (H) 1.7 - 7.7 K/uL   Lymphocytes Relative 16 %   Lymphs Abs 2.2 0.7 - 4.0 K/uL   Monocytes Relative 6 %   Monocytes Absolute 0.8 0.1 - 1.0 K/uL   Eosinophils Relative 4 %   Eosinophils Absolute 0.6 (H) 0.0 - 0.5 K/uL   Basophils Relative 0 %   Basophils Absolute 0.1 0.0 - 0.1 K/uL   Immature Granulocytes 1 %   Abs Immature Granulocytes 0.10 (H) 0.00 - 0.07 K/uL  Glucose, capillary     Status: Abnormal   Collection Time: 08/24/18 11:25 AM  Result Value Ref Range   Glucose-Capillary 129 (H) 70 - 99 mg/dL  Glucose, capillary     Status: Abnormal   Collection Time: 08/24/18  5:43 PM  Result Value Ref Range   Glucose-Capillary 178 (H) 70 - 99 mg/dL   Results  for orders placed or performed during the hospital encounter of 08/17/18  MRSA PCR Screening     Status: None   Collection Time: 08/18/18  2:28 AM  Result Value Ref Range Status   MRSA by PCR NEGATIVE NEGATIVE Final    Comment:        The GeneXpert MRSA Assay (FDA approved for NASAL specimens only), is one component of a comprehensive MRSA colonization surveillance program. It is not intended to diagnose MRSA infection nor to guide or monitor treatment for MRSA infections. Performed at [facility], [place] [address], [place], [place] [address]   Surgical pcr screen     Status: None   Collection Time: 08/18/18 10:08 AM  Result Value Ref Range Status   MRSA, PCR NEGATIVE NEGATIVE Final   Staphylococcus aureus NEGATIVE NEGATIVE Final    Comment: (NOTE) The Xpert SA Assay (FDA approved for NASAL specimens in patients 46 years of age and older), is one component of a comprehensive surveillance program. It is not intended to diagnose infection nor to guide or monitor treatment. Performed at [facility], [place] [address], [place], [place] [address]   Fungus Culture With Stain      Status: None (Preliminary result)   Collection Time: 08/18/18  2:00 PM  Result Value Ref Range Status   Fungus Stain Final report  Final    Comment: (NOTE) Performed At: [facility] [place], [place] [id number] Rush Farmer MD IZ:[id number]    Fungus (Mycology) Culture PENDING  Incomplete   Fungal Source BONE  Final    Comment: LEFT 5TH Performed at [facility], [place] [address] [place], [place] [address]   Aerobic/Anaerobic Culture (surgical/deep wound)     Status: Abnormal   Collection Time: 08/18/18  2:00 PM  Result Value Ref Range Status   Specimen Description BONE LEFT 5TH  Final   Special Requests NONE  Final   Gram Stain   Final    RARE WBC PRESENT, PREDOMINANTLY PMN FEW GRAM NEGATIVE RODS    Culture (A)  Final    MULTIPLE ORGANISMS PRESENT, NONE PREDOMINANT RARE BACTEROIDES VULGATUS BETA LACTAMASE NEGATIVE Performed at [facility], [place] [address], [place], [place] [address]    Report Status 08/23/2018 FINAL  Final  Fungus Culture Result     Status: None   Collection Time: 08/18/18  2:00 PM  Result Value Ref Range Status   Result 1 Comment  Final    Comment: (NOTE) KOH/Calcofluor preparation:  no fungus observed. Performed At: [facility] [place], [place] [id number] Rush Farmer MD WI:[id number]   Fungus Culture With Stain     Status: None (Preliminary result)   Collection Time: 08/18/18  2:01 PM  Result Value Ref Range Status   Fungus Stain Final report  Final    Comment: (NOTE) Performed At: [facility] [place], [place] [id number] Rush Farmer MD [id number]    Fungus (Mycology) Culture PENDING  Incomplete   Fungal Source TISSUE  Final    Comment: LEFT 5TH Performed at [facility], [place] [address] [place], [place] [address]   Aerobic/Anaerobic Culture (surgical/deep wound)     Status: Abnormal   Collection Time: 08/18/18  2:01 PM  Result Value Ref  Range Status   Specimen Description TISSUE LEFT 5TH  Final   Special Requests NONE  Final   Gram Stain NO WBC SEEN FEW GRAM NEGATIVE RODS   Final  Culture (A)  Final    MULTIPLE ORGANISMS PRESENT, NONE PREDOMINANT FEW BACTEROIDES VULGATUS BETA LACTAMASE NEGATIVE Performed at [facility], [place] [address], [place], [place] [address]    Report Status 08/23/2018 FINAL  Final  Fungus Culture Result     Status: None   Collection Time: 08/18/18  2:01 PM  Result Value Ref Range Status   Result 1 Comment  Final    Comment: (NOTE) KOH/Calcofluor preparation:  no fungus observed. Performed At: [facility] [place], [place] [id number] Rush Farmer MD ER:[id number]     Assessment & Plan:  Patient was evaluated and treated and all questions answered.  Diabetic foot infection L foot. -Labs reviewed. WBC downtrending but not normalized. -Micro pending. Multiple anaerobes. Abx per ID. -Path reviewed. Evidence of clear resection margin. -Patient had refused [facility] reapplication by RN. I do think this will heal faster with VAC but it appears to be healing well. Santyl WTD applied today. RN to dress daily while in house. -Strict WB only in surgical shoe.  F/u in the office next week for re-eval

## 2018-08-26 ENCOUNTER — Encounter (HOSPITAL_COMMUNITY): Payer: Self-pay

## 2018-08-26 DIAGNOSIS — E10621 Type 1 diabetes mellitus with foot ulcer: Secondary | ICD-10-CM | POA: Diagnosis not present

## 2018-08-26 DIAGNOSIS — D631 Anemia in chronic kidney disease: Secondary | ICD-10-CM | POA: Diagnosis not present

## 2018-08-26 DIAGNOSIS — N186 End stage renal disease: Secondary | ICD-10-CM | POA: Diagnosis not present

## 2018-08-26 DIAGNOSIS — R739 Hyperglycemia, unspecified: Secondary | ICD-10-CM | POA: Diagnosis not present

## 2018-08-26 DIAGNOSIS — N2581 Secondary hyperparathyroidism of renal origin: Secondary | ICD-10-CM | POA: Diagnosis not present

## 2018-08-26 DIAGNOSIS — D509 Iron deficiency anemia, unspecified: Secondary | ICD-10-CM | POA: Diagnosis not present

## 2018-08-26 DIAGNOSIS — E1022 Type 1 diabetes mellitus with diabetic chronic kidney disease: Secondary | ICD-10-CM | POA: Diagnosis not present

## 2018-08-26 DIAGNOSIS — E1042 Type 1 diabetes mellitus with diabetic polyneuropathy: Secondary | ICD-10-CM | POA: Diagnosis not present

## 2018-08-26 DIAGNOSIS — I120 Hypertensive chronic kidney disease with stage 5 chronic kidney disease or end stage renal disease: Secondary | ICD-10-CM | POA: Diagnosis not present

## 2018-08-26 DIAGNOSIS — L97422 Non-pressure chronic ulcer of left heel and midfoot with fat layer exposed: Secondary | ICD-10-CM | POA: Diagnosis not present

## 2018-08-28 DIAGNOSIS — N186 End stage renal disease: Secondary | ICD-10-CM | POA: Diagnosis not present

## 2018-08-28 DIAGNOSIS — I129 Hypertensive chronic kidney disease with stage 1 through stage 4 chronic kidney disease, or unspecified chronic kidney disease: Secondary | ICD-10-CM | POA: Diagnosis not present

## 2018-08-28 DIAGNOSIS — Z992 Dependence on renal dialysis: Secondary | ICD-10-CM | POA: Diagnosis not present

## 2018-08-29 DIAGNOSIS — E10621 Type 1 diabetes mellitus with foot ulcer: Secondary | ICD-10-CM | POA: Diagnosis not present

## 2018-08-29 DIAGNOSIS — R739 Hyperglycemia, unspecified: Secondary | ICD-10-CM | POA: Diagnosis not present

## 2018-08-29 DIAGNOSIS — I120 Hypertensive chronic kidney disease with stage 5 chronic kidney disease or end stage renal disease: Secondary | ICD-10-CM | POA: Diagnosis not present

## 2018-08-29 DIAGNOSIS — D509 Iron deficiency anemia, unspecified: Secondary | ICD-10-CM | POA: Diagnosis not present

## 2018-08-29 DIAGNOSIS — N2581 Secondary hyperparathyroidism of renal origin: Secondary | ICD-10-CM | POA: Diagnosis not present

## 2018-08-29 DIAGNOSIS — D631 Anemia in chronic kidney disease: Secondary | ICD-10-CM | POA: Diagnosis not present

## 2018-08-29 DIAGNOSIS — L97422 Non-pressure chronic ulcer of left heel and midfoot with fat layer exposed: Secondary | ICD-10-CM | POA: Diagnosis not present

## 2018-08-29 DIAGNOSIS — E1022 Type 1 diabetes mellitus with diabetic chronic kidney disease: Secondary | ICD-10-CM | POA: Diagnosis not present

## 2018-08-29 DIAGNOSIS — N186 End stage renal disease: Secondary | ICD-10-CM | POA: Diagnosis not present

## 2018-08-29 DIAGNOSIS — E1042 Type 1 diabetes mellitus with diabetic polyneuropathy: Secondary | ICD-10-CM | POA: Diagnosis not present

## 2018-08-30 ENCOUNTER — Telehealth: Payer: Self-pay | Admitting: *Deleted

## 2018-08-31 ENCOUNTER — Telehealth: Payer: Self-pay | Admitting: Podiatry

## 2018-08-31 DIAGNOSIS — N186 End stage renal disease: Secondary | ICD-10-CM | POA: Diagnosis not present

## 2018-08-31 DIAGNOSIS — E10621 Type 1 diabetes mellitus with foot ulcer: Secondary | ICD-10-CM | POA: Diagnosis not present

## 2018-08-31 DIAGNOSIS — I120 Hypertensive chronic kidney disease with stage 5 chronic kidney disease or end stage renal disease: Secondary | ICD-10-CM | POA: Diagnosis not present

## 2018-08-31 DIAGNOSIS — D631 Anemia in chronic kidney disease: Secondary | ICD-10-CM | POA: Diagnosis not present

## 2018-08-31 DIAGNOSIS — L97422 Non-pressure chronic ulcer of left heel and midfoot with fat layer exposed: Secondary | ICD-10-CM | POA: Diagnosis not present

## 2018-08-31 DIAGNOSIS — D509 Iron deficiency anemia, unspecified: Secondary | ICD-10-CM | POA: Diagnosis not present

## 2018-08-31 DIAGNOSIS — N2581 Secondary hyperparathyroidism of renal origin: Secondary | ICD-10-CM | POA: Diagnosis not present

## 2018-08-31 DIAGNOSIS — E1022 Type 1 diabetes mellitus with diabetic chronic kidney disease: Secondary | ICD-10-CM | POA: Diagnosis not present

## 2018-08-31 DIAGNOSIS — E1042 Type 1 diabetes mellitus with diabetic polyneuropathy: Secondary | ICD-10-CM | POA: Diagnosis not present

## 2018-08-31 DIAGNOSIS — R739 Hyperglycemia, unspecified: Secondary | ICD-10-CM | POA: Diagnosis not present

## 2018-08-31 NOTE — Telephone Encounter (Addendum)
Attempted x2 to call patient for scheduling hospital follow up appointment. Will continue to try. Shane Gandy, RN   [place] Callas, NP  Shane Gandy, RN         I can see him either on Thursday 12/19 or 12/26. He is a MWF HD patient from what I recall. If that does not work with him Baxter Flattery has some availabilities on 12/17 I think. OK with another provider if needed - Marya Amsler has seen him in the past as well.   Previous Messages    ----- Message -----  From: Shane Gandy, RN  Sent: 08/29/2018  5:06 PM EST  To: [place] Callas, NP   Just seeing this 1 week later. Can you look at your/snider's availability and tell me where you'd like him to be scheduled? OK with another provider?  Thank you,  Sharyn Lull  ----- Message -----  From: [place] Callas, NP  Sent: 08/23/2018  3:05 PM EST  To: [facility] Triage Nurse Pool   Will you please call Melvyn next week to schedule follow up appointment wither Dr. Baxter Flattery or myself for 30 min [facility] appt? He would not allow me to make an appointment prior to D/C... He is HD patient and needs AM appt on a Tuesday or Thursday.  Thank you kindly.

## 2018-08-31 NOTE — Telephone Encounter (Signed)
Patient is being non-compliant/attitude to  [facility]. Patient is not following plan of care. Please call the Nurse back

## 2018-08-31 NOTE — Telephone Encounter (Signed)
Please call the nurse Vaughan Basta

## 2018-08-31 NOTE — Telephone Encounter (Addendum)
Vaughan Basta - [facility] last 3 visits states pt is refusing to allow assessment and VS that are required by Medicare and Medicare visit must last 30 minutes, pt will only allow nurse to change the dressing and tells nurse to leave. Vaughan Basta states pt is rude and at last visit told her that he had to go to the bathroom so she could just leave. Vaughan Basta states pt may be upset about amputation of the toes.

## 2018-08-31 NOTE — Telephone Encounter (Signed)
Called and spoke to patient's nurse Vaughan Basta.  States the wound has been looking very well and having minimal drainage no signs of infection and the wound continues to show good healthy pink viable tissue.  She is concerned about his attitude and that if he does not allow her to do her job she will have to dismiss patient.  I told him that I will discuss with patient the importance of being compliant with therapy and that if he does not change the attitude and allow her to do her job he is at risk of losing home health care.  He has been fired from other and she is before due to the way he has treated staff and I feel that it would be difficult to find another home health care agency to take him.  We will reiterate this with patient tomorrow.

## 2018-09-01 ENCOUNTER — Ambulatory Visit (INDEPENDENT_AMBULATORY_CARE_PROVIDER_SITE_OTHER): Payer: Medicare Other | Admitting: Podiatry

## 2018-09-01 ENCOUNTER — Telehealth: Payer: Self-pay | Admitting: Podiatry

## 2018-09-01 DIAGNOSIS — L97421 Non-pressure chronic ulcer of left heel and midfoot limited to breakdown of skin: Secondary | ICD-10-CM

## 2018-09-01 DIAGNOSIS — E08621 Diabetes mellitus due to underlying condition with foot ulcer: Secondary | ICD-10-CM

## 2018-09-01 DIAGNOSIS — Z9119 Patient's noncompliance with other medical treatment and regimen: Secondary | ICD-10-CM

## 2018-09-01 DIAGNOSIS — Z91199 Patient's noncompliance with other medical treatment and regimen due to unspecified reason: Secondary | ICD-10-CM

## 2018-09-01 DIAGNOSIS — Z9889 Other specified postprocedural states: Secondary | ICD-10-CM

## 2018-09-01 NOTE — Progress Notes (Signed)
Subjective:  Patient ID: [name], male    DOB: [birth year],  MRN: [id number]  Chief Complaint  Patient presents with  . Diabetic Ulcer    left mid foot - hospital follow up    37 y.o. male presents with the above complaint. Complains today about how his recent events have been affected his mood and he has been quite upset. Does not look at the wound so he does not know how it has be.en doing but states his nurse has been coming and states it looks ok. Strong marijuana odor noted about the patient today.  Review of Systems: Negative except as noted in the HPI. Denies N/V/F/Ch.  Past Medical History:  Diagnosis Date  . Diabetes mellitus without complication ([place])   . Diabetic gastroparesis ([place])   . Dialysis patient ([place])   . Hypertension   . Renal disorder    Dialysis  . Sepsis ([place])     Current Outpatient Medications:  .  acetaminophen (TYLENOL) 325 MG tablet, Take 2 tablets (650 mg total) by mouth every 6 (six) hours as needed for mild pain or headache (pain/headache). Can restart this after you are done with Oxycodone/Tylenol, Disp: , Rfl:  .  Amino Acids-Protein Hydrolys (FEEDING SUPPLEMENT, PRO-STAT SUGAR FREE 64,) LIQD, Take 30 mLs by mouth 2 (two) times daily., Disp: 900 mL, Rfl: 0 .  amLODipine (NORVASC) 10 MG tablet, Take 10 mg by mouth daily., Disp: , Rfl:  .  amoxicillin-clavulanate (AUGMENTIN) 500-125 MG tablet, Take 1 tablet (500 mg total) by mouth daily for 28 days., Disp: 28 tablet, Rfl: 0 .  cinacalcet (SENSIPAR) 30 MG tablet, Take 30 mg by mouth daily with supper., Disp: , Rfl:  .  clonazePAM (KLONOPIN) 0.5 MG tablet, Take 0.5 mg by mouth daily as needed for anxiety (sleep). , Disp: , Rfl:  .  cloNIDine (CATAPRES - DOSED IN MG/24 HR) 0.3 mg/24hr patch, APPLY ONE PATCH TOPICALLY ONCE A WEEK, Disp: , Rfl:  .  collagenase (SANTYL) ointment, Apply 1 application topically daily., Disp: 15 g, Rfl: 5 .  collagenase (SANTYL) ointment, Apply 1 application topically  daily., Disp: 15 g, Rfl: 0 .  glucose blood (KROGER TEST STRIPS) test strip, by Other route 4 (four) times a day as needed., Disp: , Rfl:  .  insulin aspart (NOVOLOG) 100 UNIT/ML injection, Inject 5 Units into the skin 2 (two) times daily with a meal. , Disp: , Rfl:  .  insulin glargine (LANTUS) 100 UNIT/ML injection, Inject 0.15 mLs (15 Units total) into the skin at bedtime., Disp: 10 mL, Rfl: 1 .  lactobacillus acidophilus & bulgar (LACTINEX) chewable tablet, Chew 1 tablet by mouth 3 (three) times daily with meals., Disp: 90 tablet, Rfl: 0 .  losartan (COZAAR) 50 MG tablet, Take 50 mg by mouth daily., Disp: , Rfl:  .  metoCLOPramide (REGLAN) 5 MG tablet, TAKE ONE TABLET BY MOUTH THREE TIMES DAILY BEFORE MEAL(S), Disp: , Rfl:  .  ondansetron (ZOFRAN ODT) 4 MG disintegrating tablet, Take 1 tablet (4 mg total) by mouth every 8 (eight) hours as needed for nausea or vomiting., Disp: 10 tablet, Rfl: 0 .  oxyCODONE-acetaminophen (PERCOCET/ROXICET) 5-325 MG tablet, Take 1 tablet by mouth every 6 (six) hours as needed., Disp: 10 tablet, Rfl: 0 .  sevelamer carbonate (RENVELA) 800 MG tablet, Take 2,400-3,200 mg by mouth See admin instructions. Take 3 - 4 tablets (2400 mg -3200 mg) by mouth two times daily with meals, Disp: , Rfl:   Social History  Tobacco Use  Smoking Status Never Smoker  Smokeless Tobacco Never Used    No Known Allergies Objective:  There were no vitals filed for this visit. There is no height or weight on file to calculate BMI. Constitutional Well developed. Well nourished.  Vascular Dorsalis pedis pulses palpable bilaterally. Posterior tibial pulses palpable bilaterally. Capillary refill normal to all digits.  No cyanosis or clubbing noted. Pedal hair growth normal.  Neurologic Normal speech. Oriented to person, place, and time. Epicritic sensation to light touch grossly present bilaterally.  Dermatologic Nails well groomed and normal in appearance. No skin  lesions.  Skin edges coapted. Mild maceration. 5x2 granular open ulceration distally without necrosis, erythema, signs of acute infection. No probe to bone.  Orthopedic: Normal joint ROM without pain or crepitus bilaterally. No visible deformities. Amputation noted bilat 5th toe and partial metatarsal No bony tenderness.    Radiographs: Taken reviewed no underlying osseous erosions noted.  Assessment:   1. Diabetic ulcer of left midfoot associated with diabetes mellitus due to underlying condition, limited to breakdown of skin ([place])   2. Post-operative state   3. Non-compliance    Plan:  Patient was evaluated and treated and all questions answered.  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 I do think that a wound VAC would help accelerate this but patient has refused this. -Santyl WTD to be applied qOD by [facility] nurse. -Discussed strict NWB to the left foot. Patient has not been compliant with shoe and has been walking around at home without the shoe on. -No signs of acute infection today necessitating antibiotics. -F/u in 1 week for recheck.  No follow-ups on file.

## 2018-09-01 NOTE — Telephone Encounter (Signed)
Pt called requesting a refill on on his prescription of Oxycodone. Pt would like for the prescription to be sent to the [facility] at [facility]

## 2018-09-02 ENCOUNTER — Telehealth: Payer: Self-pay | Admitting: *Deleted

## 2018-09-02 DIAGNOSIS — E10621 Type 1 diabetes mellitus with foot ulcer: Secondary | ICD-10-CM | POA: Diagnosis not present

## 2018-09-02 DIAGNOSIS — E1022 Type 1 diabetes mellitus with diabetic chronic kidney disease: Secondary | ICD-10-CM | POA: Diagnosis not present

## 2018-09-02 DIAGNOSIS — N2581 Secondary hyperparathyroidism of renal origin: Secondary | ICD-10-CM | POA: Diagnosis not present

## 2018-09-02 DIAGNOSIS — E1042 Type 1 diabetes mellitus with diabetic polyneuropathy: Secondary | ICD-10-CM | POA: Diagnosis not present

## 2018-09-02 DIAGNOSIS — D509 Iron deficiency anemia, unspecified: Secondary | ICD-10-CM | POA: Diagnosis not present

## 2018-09-02 DIAGNOSIS — D631 Anemia in chronic kidney disease: Secondary | ICD-10-CM | POA: Diagnosis not present

## 2018-09-02 DIAGNOSIS — N186 End stage renal disease: Secondary | ICD-10-CM | POA: Diagnosis not present

## 2018-09-02 DIAGNOSIS — I120 Hypertensive chronic kidney disease with stage 5 chronic kidney disease or end stage renal disease: Secondary | ICD-10-CM | POA: Diagnosis not present

## 2018-09-02 DIAGNOSIS — R739 Hyperglycemia, unspecified: Secondary | ICD-10-CM | POA: Diagnosis not present

## 2018-09-02 DIAGNOSIS — L97422 Non-pressure chronic ulcer of left heel and midfoot with fat layer exposed: Secondary | ICD-10-CM | POA: Diagnosis not present

## 2018-09-02 NOTE — Telephone Encounter (Signed)
I informed [name] of Dr. Eleanora Neighbor orders and she states she has been following them.

## 2018-09-02 NOTE — Telephone Encounter (Signed)
-----   Message from Evelina Bucy, DPM sent at 09/01/2018  9:51 PM EST ----- Can we make sure his nurse has orders for Santyl WTD thrice weekly.

## 2018-09-02 NOTE — Telephone Encounter (Signed)
Was able to reach patient. He declined to make an appointment at this time, stating that Dr March Rummage is handling his case well at this point.  He asked if we could review Dr Eleanora Neighbor notes and see if he still needs to follow up here after a few more weeks.

## 2018-09-05 DIAGNOSIS — E1022 Type 1 diabetes mellitus with diabetic chronic kidney disease: Secondary | ICD-10-CM | POA: Diagnosis not present

## 2018-09-05 DIAGNOSIS — N2581 Secondary hyperparathyroidism of renal origin: Secondary | ICD-10-CM | POA: Diagnosis not present

## 2018-09-05 DIAGNOSIS — D509 Iron deficiency anemia, unspecified: Secondary | ICD-10-CM | POA: Diagnosis not present

## 2018-09-05 DIAGNOSIS — I120 Hypertensive chronic kidney disease with stage 5 chronic kidney disease or end stage renal disease: Secondary | ICD-10-CM | POA: Diagnosis not present

## 2018-09-05 DIAGNOSIS — N186 End stage renal disease: Secondary | ICD-10-CM | POA: Diagnosis not present

## 2018-09-05 DIAGNOSIS — E1042 Type 1 diabetes mellitus with diabetic polyneuropathy: Secondary | ICD-10-CM | POA: Diagnosis not present

## 2018-09-05 DIAGNOSIS — E10621 Type 1 diabetes mellitus with foot ulcer: Secondary | ICD-10-CM | POA: Diagnosis not present

## 2018-09-05 DIAGNOSIS — R739 Hyperglycemia, unspecified: Secondary | ICD-10-CM | POA: Diagnosis not present

## 2018-09-05 DIAGNOSIS — L97422 Non-pressure chronic ulcer of left heel and midfoot with fat layer exposed: Secondary | ICD-10-CM | POA: Diagnosis not present

## 2018-09-05 DIAGNOSIS — D631 Anemia in chronic kidney disease: Secondary | ICD-10-CM | POA: Diagnosis not present

## 2018-09-07 ENCOUNTER — Telehealth: Payer: Self-pay | Admitting: *Deleted

## 2018-09-07 DIAGNOSIS — R739 Hyperglycemia, unspecified: Secondary | ICD-10-CM | POA: Diagnosis not present

## 2018-09-07 DIAGNOSIS — D631 Anemia in chronic kidney disease: Secondary | ICD-10-CM | POA: Diagnosis not present

## 2018-09-07 DIAGNOSIS — E1042 Type 1 diabetes mellitus with diabetic polyneuropathy: Secondary | ICD-10-CM | POA: Diagnosis not present

## 2018-09-07 DIAGNOSIS — I120 Hypertensive chronic kidney disease with stage 5 chronic kidney disease or end stage renal disease: Secondary | ICD-10-CM | POA: Diagnosis not present

## 2018-09-07 DIAGNOSIS — L97422 Non-pressure chronic ulcer of left heel and midfoot with fat layer exposed: Secondary | ICD-10-CM | POA: Diagnosis not present

## 2018-09-07 DIAGNOSIS — N2581 Secondary hyperparathyroidism of renal origin: Secondary | ICD-10-CM | POA: Diagnosis not present

## 2018-09-07 DIAGNOSIS — D509 Iron deficiency anemia, unspecified: Secondary | ICD-10-CM | POA: Diagnosis not present

## 2018-09-07 DIAGNOSIS — E10621 Type 1 diabetes mellitus with foot ulcer: Secondary | ICD-10-CM | POA: Diagnosis not present

## 2018-09-07 DIAGNOSIS — E1022 Type 1 diabetes mellitus with diabetic chronic kidney disease: Secondary | ICD-10-CM | POA: Diagnosis not present

## 2018-09-07 DIAGNOSIS — N186 End stage renal disease: Secondary | ICD-10-CM | POA: Diagnosis not present

## 2018-09-07 NOTE — Telephone Encounter (Signed)
Pt is requesting pain medication refill.

## 2018-09-08 ENCOUNTER — Ambulatory Visit (INDEPENDENT_AMBULATORY_CARE_PROVIDER_SITE_OTHER): Payer: Medicare Other | Admitting: Podiatry

## 2018-09-08 DIAGNOSIS — L97421 Non-pressure chronic ulcer of left heel and midfoot limited to breakdown of skin: Secondary | ICD-10-CM

## 2018-09-08 DIAGNOSIS — E08621 Diabetes mellitus due to underlying condition with foot ulcer: Secondary | ICD-10-CM

## 2018-09-09 DIAGNOSIS — D631 Anemia in chronic kidney disease: Secondary | ICD-10-CM | POA: Diagnosis not present

## 2018-09-09 DIAGNOSIS — N186 End stage renal disease: Secondary | ICD-10-CM | POA: Diagnosis not present

## 2018-09-09 DIAGNOSIS — N2581 Secondary hyperparathyroidism of renal origin: Secondary | ICD-10-CM | POA: Diagnosis not present

## 2018-09-09 DIAGNOSIS — D509 Iron deficiency anemia, unspecified: Secondary | ICD-10-CM | POA: Diagnosis not present

## 2018-09-09 DIAGNOSIS — I120 Hypertensive chronic kidney disease with stage 5 chronic kidney disease or end stage renal disease: Secondary | ICD-10-CM | POA: Diagnosis not present

## 2018-09-09 DIAGNOSIS — E1022 Type 1 diabetes mellitus with diabetic chronic kidney disease: Secondary | ICD-10-CM | POA: Diagnosis not present

## 2018-09-09 DIAGNOSIS — R739 Hyperglycemia, unspecified: Secondary | ICD-10-CM | POA: Diagnosis not present

## 2018-09-09 DIAGNOSIS — E1042 Type 1 diabetes mellitus with diabetic polyneuropathy: Secondary | ICD-10-CM | POA: Diagnosis not present

## 2018-09-09 DIAGNOSIS — E10621 Type 1 diabetes mellitus with foot ulcer: Secondary | ICD-10-CM | POA: Diagnosis not present

## 2018-09-09 DIAGNOSIS — L97422 Non-pressure chronic ulcer of left heel and midfoot with fat layer exposed: Secondary | ICD-10-CM | POA: Diagnosis not present

## 2018-09-12 DIAGNOSIS — R739 Hyperglycemia, unspecified: Secondary | ICD-10-CM | POA: Diagnosis not present

## 2018-09-12 DIAGNOSIS — N186 End stage renal disease: Secondary | ICD-10-CM | POA: Diagnosis not present

## 2018-09-12 DIAGNOSIS — E10621 Type 1 diabetes mellitus with foot ulcer: Secondary | ICD-10-CM | POA: Diagnosis not present

## 2018-09-12 DIAGNOSIS — E1022 Type 1 diabetes mellitus with diabetic chronic kidney disease: Secondary | ICD-10-CM | POA: Diagnosis not present

## 2018-09-12 DIAGNOSIS — L97422 Non-pressure chronic ulcer of left heel and midfoot with fat layer exposed: Secondary | ICD-10-CM | POA: Diagnosis not present

## 2018-09-12 DIAGNOSIS — D509 Iron deficiency anemia, unspecified: Secondary | ICD-10-CM | POA: Diagnosis not present

## 2018-09-12 DIAGNOSIS — D631 Anemia in chronic kidney disease: Secondary | ICD-10-CM | POA: Diagnosis not present

## 2018-09-12 DIAGNOSIS — N2581 Secondary hyperparathyroidism of renal origin: Secondary | ICD-10-CM | POA: Diagnosis not present

## 2018-09-12 DIAGNOSIS — E1042 Type 1 diabetes mellitus with diabetic polyneuropathy: Secondary | ICD-10-CM | POA: Diagnosis not present

## 2018-09-12 DIAGNOSIS — I120 Hypertensive chronic kidney disease with stage 5 chronic kidney disease or end stage renal disease: Secondary | ICD-10-CM | POA: Diagnosis not present

## 2018-09-13 ENCOUNTER — Telehealth: Payer: Self-pay | Admitting: Podiatry

## 2018-09-13 NOTE — Telephone Encounter (Signed)
Vaughan Basta from [facility] called requesting that nurse give her a call back regarding pt.

## 2018-09-13 NOTE — Telephone Encounter (Signed)
Left message stating she had an update on pt's situation.

## 2018-09-13 NOTE — Telephone Encounter (Signed)
Thanks for letting me know - let me know if there's an update to this
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
## 2018-09-13 NOTE — Telephone Encounter (Addendum)
[name] - [facility] states they are pulling her from pt's care, pt is able to perform own wound care and home health care requirements state if pt can perform the care they are discharge from [facility].

## 2018-09-14 DIAGNOSIS — N2581 Secondary hyperparathyroidism of renal origin: Secondary | ICD-10-CM | POA: Diagnosis not present

## 2018-09-14 DIAGNOSIS — N186 End stage renal disease: Secondary | ICD-10-CM | POA: Diagnosis not present

## 2018-09-14 DIAGNOSIS — D631 Anemia in chronic kidney disease: Secondary | ICD-10-CM | POA: Diagnosis not present

## 2018-09-14 DIAGNOSIS — E1022 Type 1 diabetes mellitus with diabetic chronic kidney disease: Secondary | ICD-10-CM | POA: Diagnosis not present

## 2018-09-14 DIAGNOSIS — D509 Iron deficiency anemia, unspecified: Secondary | ICD-10-CM | POA: Diagnosis not present

## 2018-09-14 DIAGNOSIS — R739 Hyperglycemia, unspecified: Secondary | ICD-10-CM | POA: Diagnosis not present

## 2018-09-15 ENCOUNTER — Ambulatory Visit (INDEPENDENT_AMBULATORY_CARE_PROVIDER_SITE_OTHER): Payer: Medicare Other | Admitting: Podiatry

## 2018-09-15 DIAGNOSIS — L97421 Non-pressure chronic ulcer of left heel and midfoot limited to breakdown of skin: Secondary | ICD-10-CM

## 2018-09-15 DIAGNOSIS — Z9889 Other specified postprocedural states: Secondary | ICD-10-CM

## 2018-09-15 DIAGNOSIS — E08621 Diabetes mellitus due to underlying condition with foot ulcer: Secondary | ICD-10-CM

## 2018-09-15 NOTE — Progress Notes (Signed)
Subjective:  Patient ID: [name], male    DOB: January 23, 1981,  MRN: [id number]  Chief Complaint  Patient presents with  . Routine Post Op    POV Amputation last week, Pt req to see   37 y.o. male presents with the above complaint. Doing ok. No new complaints. HHC has been applying Santyl.   Review of Systems: Negative except as noted in the HPI. Denies N/V/F/Ch.  Past Medical History:  Diagnosis Date  . Diabetes mellitus without complication ([place])   . Diabetic gastroparesis ([place])   . Dialysis patient ([place])   . Hypertension   . Renal disorder    Dialysis  . Sepsis ([place])     Current Outpatient Medications:  .  acetaminophen (TYLENOL) 325 MG tablet, Take 2 tablets (650 mg total) by mouth every 6 (six) hours as needed for mild pain or headache (pain/headache). Can restart this after you are done with Oxycodone/Tylenol, Disp: , Rfl:  .  Amino Acids-Protein Hydrolys (FEEDING SUPPLEMENT, PRO-STAT SUGAR FREE 64,) LIQD, Take 30 mLs by mouth 2 (two) times daily., Disp: 900 mL, Rfl: 0 .  amLODipine (NORVASC) 10 MG tablet, Take 10 mg by mouth daily., Disp: , Rfl:  .  amoxicillin-clavulanate (AUGMENTIN) 500-125 MG tablet, Take 1 tablet (500 mg total) by mouth daily for 28 days., Disp: 28 tablet, Rfl: 0 .  cinacalcet (SENSIPAR) 30 MG tablet, Take 30 mg by mouth daily with supper., Disp: , Rfl:  .  clonazePAM (KLONOPIN) 0.5 MG tablet, Take 0.5 mg by mouth daily as needed for anxiety (sleep). , Disp: , Rfl:  .  cloNIDine (CATAPRES - DOSED IN MG/24 HR) 0.3 mg/24hr patch, APPLY ONE PATCH TOPICALLY ONCE A WEEK, Disp: , Rfl:  .  collagenase (SANTYL) ointment, Apply 1 application topically daily., Disp: 15 g, Rfl: 5 .  collagenase (SANTYL) ointment, Apply 1 application topically daily., Disp: 15 g, Rfl: 0 .  glucose blood (KROGER TEST STRIPS) test strip, by Other route 4 (four) times a day as needed., Disp: , Rfl:  .  insulin aspart (NOVOLOG) 100 UNIT/ML injection, Inject 5 Units into the skin 2  (two) times daily with a meal. , Disp: , Rfl:  .  insulin glargine (LANTUS) 100 UNIT/ML injection, Inject 0.15 mLs (15 Units total) into the skin at bedtime., Disp: 10 mL, Rfl: 1 .  lactobacillus acidophilus & bulgar (LACTINEX) chewable tablet, Chew 1 tablet by mouth 3 (three) times daily with meals., Disp: 90 tablet, Rfl: 0 .  losartan (COZAAR) 50 MG tablet, Take 50 mg by mouth daily., Disp: , Rfl:  .  metoCLOPramide (REGLAN) 5 MG tablet, TAKE ONE TABLET BY MOUTH THREE TIMES DAILY BEFORE MEAL(S), Disp: , Rfl:  .  ondansetron (ZOFRAN ODT) 4 MG disintegrating tablet, Take 1 tablet (4 mg total) by mouth every 8 (eight) hours as needed for nausea or vomiting., Disp: 10 tablet, Rfl: 0 .  oxyCODONE-acetaminophen (PERCOCET/ROXICET) 5-325 MG tablet, Take 1 tablet by mouth every 6 (six) hours as needed., Disp: 10 tablet, Rfl: 0 .  sevelamer carbonate (RENVELA) 800 MG tablet, Take 2,400-3,200 mg by mouth See admin instructions. Take 3 - 4 tablets (2400 mg -3200 mg) by mouth two times daily with meals, Disp: , Rfl:   Social History   Tobacco Use  Smoking Status Never Smoker  Smokeless Tobacco Never Used    No Known Allergies Objective:  There were no vitals filed for this visit. There is no height or weight on file to calculate BMI. Constitutional Well  developed. Well nourished.  Vascular Dorsalis pedis pulses palpable bilaterally. Posterior tibial pulses palpable bilaterally. Capillary refill normal to all digits.  No cyanosis or clubbing noted. Pedal hair growth normal.  Neurologic Normal speech. Oriented to person, place, and time. Epicritic sensation to light touch grossly present bilaterally.  Dermatologic Nails well groomed and normal in appearance. No skin lesions.  Skin edges coapted. Mild maceration. 5x2 granular open ulceration distally without necrosis, erythema, signs of acute infection. No probe to bone.  Orthopedic: Normal joint ROM without pain or crepitus bilaterally. No  visible deformities. Amputation noted bilat 5th toe and partial metatarsal No bony tenderness.    Radiographs: Taken reviewed no underlying osseous erosions noted.  Assessment:   1. Diabetic ulcer of left midfoot associated with diabetes mellitus due to underlying condition, limited to breakdown of skin [facility])    Plan:  Patient was evaluated and treated and all questions answered.  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 -Strict NWB to the left foot.  -No signs of acute infection today necessitating antibiotics. -F/u in 1 week for recheck.  Return in about 1 week (around 09/15/2018) for Post-op, Wound Care.

## 2018-09-16 ENCOUNTER — Telehealth: Payer: Self-pay | Admitting: Podiatry

## 2018-09-16 DIAGNOSIS — D631 Anemia in chronic kidney disease: Secondary | ICD-10-CM | POA: Diagnosis not present

## 2018-09-16 DIAGNOSIS — E1022 Type 1 diabetes mellitus with diabetic chronic kidney disease: Secondary | ICD-10-CM | POA: Diagnosis not present

## 2018-09-16 DIAGNOSIS — N2581 Secondary hyperparathyroidism of renal origin: Secondary | ICD-10-CM | POA: Diagnosis not present

## 2018-09-16 DIAGNOSIS — N186 End stage renal disease: Secondary | ICD-10-CM | POA: Diagnosis not present

## 2018-09-16 DIAGNOSIS — R739 Hyperglycemia, unspecified: Secondary | ICD-10-CM | POA: Diagnosis not present

## 2018-09-16 DIAGNOSIS — D509 Iron deficiency anemia, unspecified: Secondary | ICD-10-CM | POA: Diagnosis not present

## 2018-09-16 LAB — FUNGUS CULTURE WITH STAIN

## 2018-09-16 LAB — FUNGUS CULTURE RESULT

## 2018-09-16 LAB — FUNGAL ORGANISM REFLEX

## 2018-09-16 NOTE — Telephone Encounter (Signed)
I informed pt Dr. March Rummage wanted him to perform Santyl WTD daily and I instructed pt to put a nickel thickness of santyl on the wound, wet sterile gauze and cover the wound and then roll with the fluffy roll gauze to cover. Pt states he is not going to perform his wound care and wants Dr. March Rummage to call.

## 2018-09-16 NOTE — Telephone Encounter (Signed)
Pt called stating that [facility] did not come Wednesday or today and when he called them they stated that Pt has been discharged from care. Was informed from [facility] that Dr. March Rummage was aware of this discharge. Pt states he was unaware of discharge and needs someone to change his bandages.

## 2018-09-16 NOTE — Telephone Encounter (Signed)
Called patient to inform him he or a family member must do his dressing change since he has been dismissed from Home health care.  Patient then asked to hold for a minute and left provider on hold for 5 minutes.

## 2018-09-18 DIAGNOSIS — R739 Hyperglycemia, unspecified: Secondary | ICD-10-CM | POA: Diagnosis not present

## 2018-09-18 DIAGNOSIS — D631 Anemia in chronic kidney disease: Secondary | ICD-10-CM | POA: Diagnosis not present

## 2018-09-18 DIAGNOSIS — N2581 Secondary hyperparathyroidism of renal origin: Secondary | ICD-10-CM | POA: Diagnosis not present

## 2018-09-18 DIAGNOSIS — D509 Iron deficiency anemia, unspecified: Secondary | ICD-10-CM | POA: Diagnosis not present

## 2018-09-18 DIAGNOSIS — E1022 Type 1 diabetes mellitus with diabetic chronic kidney disease: Secondary | ICD-10-CM | POA: Diagnosis not present

## 2018-09-18 DIAGNOSIS — N186 End stage renal disease: Secondary | ICD-10-CM | POA: Diagnosis not present

## 2018-09-20 DIAGNOSIS — E1022 Type 1 diabetes mellitus with diabetic chronic kidney disease: Secondary | ICD-10-CM | POA: Diagnosis not present

## 2018-09-20 DIAGNOSIS — R739 Hyperglycemia, unspecified: Secondary | ICD-10-CM | POA: Diagnosis not present

## 2018-09-20 DIAGNOSIS — N186 End stage renal disease: Secondary | ICD-10-CM | POA: Diagnosis not present

## 2018-09-20 DIAGNOSIS — N2581 Secondary hyperparathyroidism of renal origin: Secondary | ICD-10-CM | POA: Diagnosis not present

## 2018-09-20 DIAGNOSIS — D509 Iron deficiency anemia, unspecified: Secondary | ICD-10-CM | POA: Diagnosis not present

## 2018-09-20 DIAGNOSIS — D631 Anemia in chronic kidney disease: Secondary | ICD-10-CM | POA: Diagnosis not present

## 2018-09-22 ENCOUNTER — Telehealth: Payer: Self-pay | Admitting: *Deleted

## 2018-09-22 ENCOUNTER — Ambulatory Visit (INDEPENDENT_AMBULATORY_CARE_PROVIDER_SITE_OTHER): Payer: Medicare Other | Admitting: Podiatry

## 2018-09-22 DIAGNOSIS — Z9889 Other specified postprocedural states: Secondary | ICD-10-CM

## 2018-09-22 DIAGNOSIS — L97421 Non-pressure chronic ulcer of left heel and midfoot limited to breakdown of skin: Secondary | ICD-10-CM

## 2018-09-22 DIAGNOSIS — E08621 Diabetes mellitus due to underlying condition with foot ulcer: Secondary | ICD-10-CM

## 2018-09-22 NOTE — Telephone Encounter (Signed)
Dr. March Rummage ordered Collagen Ag for daily dressing changes to left 5th amputation site dehiscence measuring 5.0 x 2.0 x 0.1cm E08.621, L97.421 from [facility]. Faxed orders to [facility].

## 2018-09-23 DIAGNOSIS — D631 Anemia in chronic kidney disease: Secondary | ICD-10-CM | POA: Diagnosis not present

## 2018-09-23 DIAGNOSIS — R519 Headache, unspecified: Secondary | ICD-10-CM | POA: Insufficient documentation

## 2018-09-23 DIAGNOSIS — R739 Hyperglycemia, unspecified: Secondary | ICD-10-CM | POA: Diagnosis not present

## 2018-09-23 DIAGNOSIS — N186 End stage renal disease: Secondary | ICD-10-CM | POA: Diagnosis not present

## 2018-09-23 DIAGNOSIS — N2581 Secondary hyperparathyroidism of renal origin: Secondary | ICD-10-CM | POA: Diagnosis not present

## 2018-09-23 DIAGNOSIS — E1022 Type 1 diabetes mellitus with diabetic chronic kidney disease: Secondary | ICD-10-CM | POA: Diagnosis not present

## 2018-09-23 DIAGNOSIS — D509 Iron deficiency anemia, unspecified: Secondary | ICD-10-CM | POA: Diagnosis not present

## 2018-09-25 DIAGNOSIS — D631 Anemia in chronic kidney disease: Secondary | ICD-10-CM | POA: Diagnosis not present

## 2018-09-25 DIAGNOSIS — D509 Iron deficiency anemia, unspecified: Secondary | ICD-10-CM | POA: Diagnosis not present

## 2018-09-25 DIAGNOSIS — N186 End stage renal disease: Secondary | ICD-10-CM | POA: Diagnosis not present

## 2018-09-25 DIAGNOSIS — R739 Hyperglycemia, unspecified: Secondary | ICD-10-CM | POA: Diagnosis not present

## 2018-09-25 DIAGNOSIS — E1022 Type 1 diabetes mellitus with diabetic chronic kidney disease: Secondary | ICD-10-CM | POA: Diagnosis not present

## 2018-09-25 DIAGNOSIS — N2581 Secondary hyperparathyroidism of renal origin: Secondary | ICD-10-CM | POA: Diagnosis not present

## 2018-09-27 DIAGNOSIS — D631 Anemia in chronic kidney disease: Secondary | ICD-10-CM | POA: Diagnosis not present

## 2018-09-27 DIAGNOSIS — N186 End stage renal disease: Secondary | ICD-10-CM | POA: Diagnosis not present

## 2018-09-27 DIAGNOSIS — R739 Hyperglycemia, unspecified: Secondary | ICD-10-CM | POA: Diagnosis not present

## 2018-09-27 DIAGNOSIS — D509 Iron deficiency anemia, unspecified: Secondary | ICD-10-CM | POA: Diagnosis not present

## 2018-09-27 DIAGNOSIS — E1022 Type 1 diabetes mellitus with diabetic chronic kidney disease: Secondary | ICD-10-CM | POA: Diagnosis not present

## 2018-09-27 DIAGNOSIS — N2581 Secondary hyperparathyroidism of renal origin: Secondary | ICD-10-CM | POA: Diagnosis not present

## 2018-09-28 DIAGNOSIS — Z992 Dependence on renal dialysis: Secondary | ICD-10-CM | POA: Diagnosis not present

## 2018-09-28 DIAGNOSIS — N186 End stage renal disease: Secondary | ICD-10-CM | POA: Diagnosis not present

## 2018-09-28 DIAGNOSIS — I129 Hypertensive chronic kidney disease with stage 1 through stage 4 chronic kidney disease, or unspecified chronic kidney disease: Secondary | ICD-10-CM | POA: Diagnosis not present

## 2018-09-29 ENCOUNTER — Ambulatory Visit (INDEPENDENT_AMBULATORY_CARE_PROVIDER_SITE_OTHER): Payer: Self-pay | Admitting: Podiatry

## 2018-09-29 DIAGNOSIS — L97421 Non-pressure chronic ulcer of left heel and midfoot limited to breakdown of skin: Secondary | ICD-10-CM

## 2018-09-29 DIAGNOSIS — E08621 Diabetes mellitus due to underlying condition with foot ulcer: Secondary | ICD-10-CM

## 2018-09-29 NOTE — Progress Notes (Signed)
Subjective:  Patient ID: [name], male    DOB: [birth year],  MRN: [id number]  Chief Complaint  Patient presents with  . Foot Ulcer    1 week follow up left ;foot ulcer s/p 5th toe amputation   38 y.o. male presents with the above complaint. Doing ok. States he is trying to change his dressing himself denies new complaints.  Review of Systems: Negative except as noted in the HPI. Denies N/V/F/Ch.  Past Medical History:  Diagnosis Date  . Diabetes mellitus without complication ([place])   . Diabetic gastroparesis ([place])   . Dialysis patient ([place])   . Hypertension   . Renal disorder    Dialysis  . Sepsis ([place])     Current Outpatient Medications:  .  acetaminophen (TYLENOL) 325 MG tablet, Take 2 tablets (650 mg total) by mouth every 6 (six) hours as needed for mild pain or headache (pain/headache). Can restart this after you are done with Oxycodone/Tylenol, Disp: , Rfl:  .  Amino Acids-Protein Hydrolys (FEEDING SUPPLEMENT, PRO-STAT SUGAR FREE 64,) LIQD, Take 30 mLs by mouth 2 (two) times daily., Disp: 900 mL, Rfl: 0 .  amLODipine (NORVASC) 10 MG tablet, Take 10 mg by mouth daily., Disp: , Rfl:  .  cinacalcet (SENSIPAR) 30 MG tablet, Take 30 mg by mouth daily with supper., Disp: , Rfl:  .  clonazePAM (KLONOPIN) 0.5 MG tablet, Take 0.5 mg by mouth daily as needed for anxiety (sleep). , Disp: , Rfl:  .  cloNIDine (CATAPRES - DOSED IN MG/24 HR) 0.3 mg/24hr patch, APPLY ONE PATCH TOPICALLY ONCE A WEEK, Disp: , Rfl:  .  collagenase (SANTYL) ointment, Apply 1 application topically daily., Disp: 15 g, Rfl: 5 .  collagenase (SANTYL) ointment, Apply 1 application topically daily., Disp: 15 g, Rfl: 0 .  glucose blood (KROGER TEST STRIPS) test strip, by Other route 4 (four) times a day as needed., Disp: , Rfl:  .  insulin aspart (NOVOLOG) 100 UNIT/ML injection, Inject 5 Units into the skin 2 (two) times daily with a meal. , Disp: , Rfl:  .  insulin glargine (LANTUS) 100 UNIT/ML injection, Inject  0.15 mLs (15 Units total) into the skin at bedtime., Disp: 10 mL, Rfl: 1 .  lactobacillus acidophilus & bulgar (LACTINEX) chewable tablet, Chew 1 tablet by mouth 3 (three) times daily with meals., Disp: 90 tablet, Rfl: 0 .  losartan (COZAAR) 50 MG tablet, Take 50 mg by mouth daily., Disp: , Rfl:  .  metoCLOPramide (REGLAN) 5 MG tablet, TAKE ONE TABLET BY MOUTH THREE TIMES DAILY BEFORE MEAL(S), Disp: , Rfl:  .  ondansetron (ZOFRAN ODT) 4 MG disintegrating tablet, Take 1 tablet (4 mg total) by mouth every 8 (eight) hours as needed for nausea or vomiting., Disp: 10 tablet, Rfl: 0 .  oxyCODONE-acetaminophen (PERCOCET/ROXICET) 5-325 MG tablet, Take 1 tablet by mouth every 6 (six) hours as needed., Disp: 10 tablet, Rfl: 0 .  sevelamer carbonate (RENVELA) 800 MG tablet, Take 2,400-3,200 mg by mouth See admin instructions. Take 3 - 4 tablets (2400 mg -3200 mg) by mouth two times daily with meals, Disp: , Rfl:   Social History   Tobacco Use  Smoking Status Never Smoker  Smokeless Tobacco Never Used    No Known Allergies Objective:  There were no vitals filed for this visit. There is no height or weight on file to calculate BMI. Constitutional Well developed. Well nourished.  Vascular Dorsalis pedis pulses palpable bilaterally. Posterior tibial pulses palpable bilaterally. Capillary refill normal to  all digits.  No cyanosis or clubbing noted. Pedal hair growth normal.  Neurologic Normal speech. Oriented to person, place, and time. Epicritic sensation to light touch grossly present bilaterally.  Dermatologic Nails well groomed and normal in appearance. No skin lesions. Skin edges coapted. Mild maceration. 2x2 granular open ulceration distally without necrosis, erythema, signs of acute infection. No probe to bone.  Orthopedic: Normal joint ROM without pain or crepitus bilaterally. No visible deformities. Amputation noted bilat 5th toe and partial metatarsal No bony tenderness.     Radiographs: Taken reviewed no underlying osseous erosions noted.  Assessment:   1. Diabetic ulcer of left midfoot associated with diabetes mellitus due to underlying condition, limited to breakdown of skin [facility])    Plan:  Patient was evaluated and treated and all questions answered.  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 -Strict NWB to the left foot.  -Wound improving. Sutures removed staples left intact. -Return 1 week for wound check.  No follow-ups on file.

## 2018-09-30 DIAGNOSIS — N2581 Secondary hyperparathyroidism of renal origin: Secondary | ICD-10-CM | POA: Diagnosis not present

## 2018-09-30 DIAGNOSIS — S91302A Unspecified open wound, left foot, initial encounter: Secondary | ICD-10-CM | POA: Diagnosis not present

## 2018-09-30 DIAGNOSIS — E875 Hyperkalemia: Secondary | ICD-10-CM | POA: Diagnosis not present

## 2018-09-30 DIAGNOSIS — D509 Iron deficiency anemia, unspecified: Secondary | ICD-10-CM | POA: Diagnosis not present

## 2018-09-30 DIAGNOSIS — N186 End stage renal disease: Secondary | ICD-10-CM | POA: Diagnosis not present

## 2018-09-30 DIAGNOSIS — R739 Hyperglycemia, unspecified: Secondary | ICD-10-CM | POA: Diagnosis not present

## 2018-09-30 DIAGNOSIS — E1022 Type 1 diabetes mellitus with diabetic chronic kidney disease: Secondary | ICD-10-CM | POA: Diagnosis not present

## 2018-10-05 DIAGNOSIS — N2581 Secondary hyperparathyroidism of renal origin: Secondary | ICD-10-CM | POA: Diagnosis not present

## 2018-10-05 DIAGNOSIS — D509 Iron deficiency anemia, unspecified: Secondary | ICD-10-CM | POA: Diagnosis not present

## 2018-10-05 DIAGNOSIS — E1022 Type 1 diabetes mellitus with diabetic chronic kidney disease: Secondary | ICD-10-CM | POA: Diagnosis not present

## 2018-10-05 DIAGNOSIS — N186 End stage renal disease: Secondary | ICD-10-CM | POA: Diagnosis not present

## 2018-10-05 DIAGNOSIS — S91302A Unspecified open wound, left foot, initial encounter: Secondary | ICD-10-CM | POA: Diagnosis not present

## 2018-10-05 DIAGNOSIS — R739 Hyperglycemia, unspecified: Secondary | ICD-10-CM | POA: Diagnosis not present

## 2018-10-05 DIAGNOSIS — E1129 Type 2 diabetes mellitus with other diabetic kidney complication: Secondary | ICD-10-CM | POA: Diagnosis not present

## 2018-10-06 ENCOUNTER — Ambulatory Visit (INDEPENDENT_AMBULATORY_CARE_PROVIDER_SITE_OTHER): Payer: Medicare Other | Admitting: Podiatry

## 2018-10-06 DIAGNOSIS — E08621 Diabetes mellitus due to underlying condition with foot ulcer: Secondary | ICD-10-CM

## 2018-10-06 DIAGNOSIS — Z9889 Other specified postprocedural states: Secondary | ICD-10-CM

## 2018-10-06 DIAGNOSIS — L97421 Non-pressure chronic ulcer of left heel and midfoot limited to breakdown of skin: Secondary | ICD-10-CM

## 2018-10-07 DIAGNOSIS — R739 Hyperglycemia, unspecified: Secondary | ICD-10-CM | POA: Diagnosis not present

## 2018-10-07 DIAGNOSIS — E1022 Type 1 diabetes mellitus with diabetic chronic kidney disease: Secondary | ICD-10-CM | POA: Diagnosis not present

## 2018-10-07 DIAGNOSIS — S91302A Unspecified open wound, left foot, initial encounter: Secondary | ICD-10-CM | POA: Diagnosis not present

## 2018-10-07 DIAGNOSIS — N2581 Secondary hyperparathyroidism of renal origin: Secondary | ICD-10-CM | POA: Diagnosis not present

## 2018-10-07 DIAGNOSIS — D509 Iron deficiency anemia, unspecified: Secondary | ICD-10-CM | POA: Diagnosis not present

## 2018-10-07 DIAGNOSIS — N186 End stage renal disease: Secondary | ICD-10-CM | POA: Diagnosis not present

## 2018-10-09 NOTE — Progress Notes (Signed)
Subjective:  Patient ID: [name], male    DOB: 24-Jul-1981,  MRN: [id number]  Chief Complaint  Patient presents with  . Foot Ulcer    left foot - POV amputation left fifth toe   38 y.o. male presents with the above complaint. Doing ok. Endorses compliance with surgical shoe.   Review of Systems: Negative except as noted in the HPI. Denies N/V/F/Ch.  Past Medical History:  Diagnosis Date  . Diabetes mellitus without complication ([place])   . Diabetic gastroparesis ([place])   . Dialysis patient ([place])   . Hypertension   . Renal disorder    Dialysis  . Sepsis ([place])     Current Outpatient Medications:  .  acetaminophen (TYLENOL) 325 MG tablet, Take 2 tablets (650 mg total) by mouth every 6 (six) hours as needed for mild pain or headache (pain/headache). Can restart this after you are done with Oxycodone/Tylenol, Disp: , Rfl:  .  Amino Acids-Protein Hydrolys (FEEDING SUPPLEMENT, PRO-STAT SUGAR FREE 64,) LIQD, Take 30 mLs by mouth 2 (two) times daily., Disp: 900 mL, Rfl: 0 .  amLODipine (NORVASC) 10 MG tablet, Take 10 mg by mouth daily., Disp: , Rfl:  .  cinacalcet (SENSIPAR) 30 MG tablet, Take 30 mg by mouth daily with supper., Disp: , Rfl:  .  clonazePAM (KLONOPIN) 0.5 MG tablet, Take 0.5 mg by mouth daily as needed for anxiety (sleep). , Disp: , Rfl:  .  cloNIDine (CATAPRES - DOSED IN MG/24 HR) 0.3 mg/24hr patch, APPLY ONE PATCH TOPICALLY ONCE A WEEK, Disp: , Rfl:  .  collagenase (SANTYL) ointment, Apply 1 application topically daily., Disp: 15 g, Rfl: 5 .  collagenase (SANTYL) ointment, Apply 1 application topically daily., Disp: 15 g, Rfl: 0 .  glucose blood (KROG[employer] TEST STRIPS) test strip, by Other route 4 (four) times a day as needed., Disp: , Rfl:  .  insulin aspart (NOVOLOG) 100 UNIT/ML injection, Inject 5 Units into the skin 2 (two) times daily with a meal. , Disp: , Rfl:  .  insulin glargine (LANTUS) 100 UNIT/ML injection, Inject 0.15 mLs (15 Units total) into the skin at  bedtime., Disp: 10 mL, Rfl: 1 .  lactobacillus acidophilus & bulgar (LACTINEX) chewable tablet, Chew 1 tablet by mouth 3 (three) times daily with meals., Disp: 90 tablet, Rfl: 0 .  losartan (COZAAR) 50 MG tablet, Take 50 mg by mouth daily., Disp: , Rfl:  .  metoCLOPramide (REGLAN) 5 MG tablet, TAKE ONE TABLET BY MOUTH THREE TIMES DAILY BEFORE MEAL(S), Disp: , Rfl:  .  ondansetron (ZOFRAN ODT) 4 MG disintegrating tablet, Take 1 tablet (4 mg total) by mouth every 8 (eight) hours as needed for nausea or vomiting., Disp: 10 tablet, Rfl: 0 .  oxyCODONE-acetaminophen (PERCOCET/ROXICET) 5-325 MG tablet, Take 1 tablet by mouth every 6 (six) hours as needed., Disp: 10 tablet, Rfl: 0 .  sevelamer carbonate (RENVELA) 800 MG tablet, Take 2,400-3,200 mg by mouth See admin instructions. Take 3 - 4 tablets (2400 mg -3200 mg) by mouth two times daily with meals, Disp: , Rfl:   Social History   Tobacco Use  Smoking Status Never Smoker  Smokeless Tobacco Never Used    No Known Allergies Objective:  There were no vitals filed for this visit. There is no height or weight on file to calculate BMI. Constitutional Well developed. Well nourished.  Vascular Dorsalis pedis pulses palpable bilaterally. Posterior tibial pulses palpable bilaterally. Capillary refill normal to all digits.  No cyanosis or clubbing noted. Pedal  hair growth normal.  Neurologic Normal speech. Oriented to person, place, and time. Epicritic sensation to light touch grossly present bilaterally.  Dermatologic Nails well groomed and normal in appearance. No skin lesions. Skin edges coapted. Mild maceration No signs of infection noted remaining wound granular.  Orthopedic: Normal joint ROM without pain or crepitus bilaterally. No visible deformities. Amputation noted bilat 5th toe and partial metatarsal No bony tenderness.    Radiographs: Taken reviewed no underlying osseous erosions noted.  Assessment:   1. Diabetic ulcer of left  midfoot associated with diabetes mellitus due to underlying condition, limited to breakdown of skin ([place])   2. Post-operative state    Plan:  Patient was evaluated and treated and all questions answered.  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 -Strict NWB to the left foot.  -Wound improving. Staples removed today. -Return 1 week for wound check. -Will check on status of DM shoes.  No follow-ups on file.

## 2018-10-10 DIAGNOSIS — N186 End stage renal disease: Secondary | ICD-10-CM | POA: Diagnosis not present

## 2018-10-10 DIAGNOSIS — S91302A Unspecified open wound, left foot, initial encounter: Secondary | ICD-10-CM | POA: Diagnosis not present

## 2018-10-10 DIAGNOSIS — D509 Iron deficiency anemia, unspecified: Secondary | ICD-10-CM | POA: Diagnosis not present

## 2018-10-10 DIAGNOSIS — N2581 Secondary hyperparathyroidism of renal origin: Secondary | ICD-10-CM | POA: Diagnosis not present

## 2018-10-10 DIAGNOSIS — R739 Hyperglycemia, unspecified: Secondary | ICD-10-CM | POA: Diagnosis not present

## 2018-10-10 DIAGNOSIS — E1022 Type 1 diabetes mellitus with diabetic chronic kidney disease: Secondary | ICD-10-CM | POA: Diagnosis not present

## 2018-10-12 DIAGNOSIS — S91302A Unspecified open wound, left foot, initial encounter: Secondary | ICD-10-CM | POA: Diagnosis not present

## 2018-10-12 DIAGNOSIS — N186 End stage renal disease: Secondary | ICD-10-CM | POA: Diagnosis not present

## 2018-10-12 DIAGNOSIS — N2581 Secondary hyperparathyroidism of renal origin: Secondary | ICD-10-CM | POA: Diagnosis not present

## 2018-10-12 DIAGNOSIS — R739 Hyperglycemia, unspecified: Secondary | ICD-10-CM | POA: Diagnosis not present

## 2018-10-12 DIAGNOSIS — D509 Iron deficiency anemia, unspecified: Secondary | ICD-10-CM | POA: Diagnosis not present

## 2018-10-12 DIAGNOSIS — E1022 Type 1 diabetes mellitus with diabetic chronic kidney disease: Secondary | ICD-10-CM | POA: Diagnosis not present

## 2018-10-13 ENCOUNTER — Ambulatory Visit (INDEPENDENT_AMBULATORY_CARE_PROVIDER_SITE_OTHER): Payer: Medicare Other | Admitting: Podiatry

## 2018-10-13 DIAGNOSIS — Z9889 Other specified postprocedural states: Secondary | ICD-10-CM

## 2018-10-13 DIAGNOSIS — L97421 Non-pressure chronic ulcer of left heel and midfoot limited to breakdown of skin: Secondary | ICD-10-CM | POA: Diagnosis not present

## 2018-10-13 DIAGNOSIS — Q828 Other specified congenital malformations of skin: Secondary | ICD-10-CM

## 2018-10-13 DIAGNOSIS — E08621 Diabetes mellitus due to underlying condition with foot ulcer: Secondary | ICD-10-CM | POA: Diagnosis not present

## 2018-10-13 NOTE — Progress Notes (Signed)
Subjective:  Patient ID: [name], male    DOB: Oct 28, 1980,  MRN: [id number]  Chief Complaint  Patient presents with  . Diabetic Ulcer    Left foot s/p amputation fifth toe with delayed healing   38 y.o. male presents with the above complaint. Getting frustrated about continuing to wear the shoe but continues to dress the foot. Complains of pain to the right foot callus.  Review of Systems: Negative except as noted in the HPI. Denies N/V/F/Ch.  Past Medical History:  Diagnosis Date  . Diabetes mellitus without complication ([place])   . Diabetic gastroparesis ([place])   . Dialysis patient ([place])   . Hypertension   . Renal disorder    Dialysis  . Sepsis ([place])     Current Outpatient Medications:  .  acetaminophen (TYLENOL) 325 MG tablet, Take 2 tablets (650 mg total) by mouth every 6 (six) hours as needed for mild pain or headache (pain/headache). Can restart this after you are done with Oxycodone/Tylenol, Disp: , Rfl:  .  Amino Acids-Protein Hydrolys (FEEDING SUPPLEMENT, PRO-STAT SUGAR FREE 64,) LIQD, Take 30 mLs by mouth 2 (two) times daily., Disp: 900 mL, Rfl: 0 .  amLODipine (NORVASC) 10 MG tablet, Take 10 mg by mouth daily., Disp: , Rfl:  .  cinacalcet (SENSIPAR) 30 MG tablet, Take 30 mg by mouth daily with supper., Disp: , Rfl:  .  clonazePAM (KLONOPIN) 0.5 MG tablet, Take 0.5 mg by mouth daily as needed for anxiety (sleep). , Disp: , Rfl:  .  cloNIDine (CATAPRES - DOSED IN MG/24 HR) 0.3 mg/24hr patch, APPLY ONE PATCH TOPICALLY ONCE A WEEK, Disp: , Rfl:  .  collagenase (SANTYL) ointment, Apply 1 application topically daily., Disp: 15 g, Rfl: 5 .  collagenase (SANTYL) ointment, Apply 1 application topically daily., Disp: 15 g, Rfl: 0 .  glucose blood ([employer]OGER TEST STRIPS) test strip, by Other route 4 (four) times a day as needed., Disp: , Rfl:  .  insulin aspart (NOVOLOG) 100 UNIT/ML injection, Inject 5 Units into the skin 2 (two) times daily with a meal. , Disp: , Rfl:  .  insulin  glargine (LANTUS) 100 UNIT/ML injection, Inject 0.15 mLs (15 Units total) into the skin at bedtime., Disp: 10 mL, Rfl: 1 .  lactobacillus acidophilus & bulgar (LACTINEX) chewable tablet, Chew 1 tablet by mouth 3 (three) times daily with meals., Disp: 90 tablet, Rfl: 0 .  losartan (COZAAR) 50 MG tablet, Take 50 mg by mouth daily., Disp: , Rfl:  .  metoCLOPramide (REGLAN) 5 MG tablet, TAKE ONE TABLET BY MOUTH THREE TIMES DAILY BEFORE MEAL(S), Disp: , Rfl:  .  ondansetron (ZOFRAN ODT) 4 MG disintegrating tablet, Take 1 tablet (4 mg total) by mouth every 8 (eight) hours as needed for nausea or vomiting., Disp: 10 tablet, Rfl: 0 .  oxyCODONE-acetaminophen (PERCOCET/ROXICET) 5-325 MG tablet, Take 1 tablet by mouth every 6 (six) hours as needed., Disp: 10 tablet, Rfl: 0 .  sevelamer carbonate (RENVELA) 800 MG tablet, Take 2,400-3,200 mg by mouth See admin instructions. Take 3 - 4 tablets (2400 mg -3200 mg) by mouth two times daily with meals, Disp: , Rfl:   Social History   Tobacco Use  Smoking Status Never Smoker  Smokeless Tobacco Never Used    No Known Allergies Objective:  There were no vitals filed for this visit. There is no height or weight on file to calculate BMI. Constitutional Well developed. Well nourished.  Vascular Dorsalis pedis pulses palpable bilaterally. Posterior tibial pulses  palpable bilaterally. Capillary refill normal to all digits.  No cyanosis or clubbing noted. Pedal hair growth normal.  Neurologic Normal speech. Oriented to person, place, and time. Epicritic sensation to light touch grossly present bilaterally.  Dermatologic Nails well groomed and normal in appearance. No skin lesions. Skin edges coapted. Mild maceration No signs of infection wound improving hyperkeratotic borders. Significant hyperkeratotis right foot sub 2,3,4 metatarsals  Orthopedic: Normal joint ROM without pain or crepitus bilaterally. No visible deformities. Amputation noted bilat 5th toe  and partial metatarsal No bony tenderness.    Radiographs: Taken reviewed no underlying osseous erosions noted.  Assessment:   1. Diabetic ulcer of left midfoot associated with diabetes mellitus due to underlying condition, limited to breakdown of skin ([place])   2. Post-operative state   3. Porokeratosis    Plan:  Patient was evaluated and treated and all questions answered.  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 -Strict NWB to the left foot.  -Wound improving almost healed. -Return 1 week for wound check. -Needs Dr. Jimmy Footman to fill out DM shoe paperwork. Pt stated he will talk to Dr. Jimmy Footman.  HPK Right foot -Debrided with 312 blade.  Procedure: Paring of Lesion Rationale: painful hyperkeratotic lesion Type of Debridement: manual, sharp debridement. Instrumentation: 312 blade Number of Lesions: 2    No follow-ups on file.

## 2018-10-14 DIAGNOSIS — E1022 Type 1 diabetes mellitus with diabetic chronic kidney disease: Secondary | ICD-10-CM | POA: Diagnosis not present

## 2018-10-14 DIAGNOSIS — S91302A Unspecified open wound, left foot, initial encounter: Secondary | ICD-10-CM | POA: Diagnosis not present

## 2018-10-14 DIAGNOSIS — D509 Iron deficiency anemia, unspecified: Secondary | ICD-10-CM | POA: Diagnosis not present

## 2018-10-14 DIAGNOSIS — R739 Hyperglycemia, unspecified: Secondary | ICD-10-CM | POA: Diagnosis not present

## 2018-10-14 DIAGNOSIS — N186 End stage renal disease: Secondary | ICD-10-CM | POA: Diagnosis not present

## 2018-10-14 DIAGNOSIS — N2581 Secondary hyperparathyroidism of renal origin: Secondary | ICD-10-CM | POA: Diagnosis not present

## 2018-10-16 NOTE — Progress Notes (Signed)
Subjective:  Patient ID: [name], male    DOB: 10-05-1980,  MRN: [id number]  Chief Complaint  Patient presents with  . Routine Post Op    POV Amputation   38 y.o. male presents with the above complaint. Has been trying to change the dressing himself.  Review of Systems: Negative except as noted in the HPI. Denies N/V/F/Ch.  Past Medical History:  Diagnosis Date  . Diabetes mellitus without complication ([place])   . Diabetic gastroparesis ([place])   . Dialysis patient ([place])   . Hypertension   . Renal disorder    Dialysis  . Sepsis ([place])     Current Outpatient Medications:  .  acetaminophen (TYLENOL) 325 MG tablet, Take 2 tablets (650 mg total) by mouth every 6 (six) hours as needed for mild pain or headache (pain/headache). Can restart this after you are done with Oxycodone/Tylenol, Disp: , Rfl:  .  Amino Acids-Protein Hydrolys (FEEDING SUPPLEMENT, PRO-STAT SUGAR FREE 64,) LIQD, Take 30 mLs by mouth 2 (two) times daily., Disp: 900 mL, Rfl: 0 .  amLODipine (NORVASC) 10 MG tablet, Take 10 mg by mouth daily., Disp: , Rfl:  .  cinacalcet (SENSIPAR) 30 MG tablet, Take 30 mg by mouth daily with supper., Disp: , Rfl:  .  clonazePAM (KLONOPIN) 0.5 MG tablet, Take 0.5 mg by mouth daily as needed for anxiety (sleep). , Disp: , Rfl:  .  cloNIDine (CATAPRES - DOSED IN MG/24 HR) 0.3 mg/24hr patch, APPLY ONE PATCH TOPICALLY ONCE A WEEK, Disp: , Rfl:  .  collagenase (SANTYL) ointment, Apply 1 application topically daily., Disp: 15 g, Rfl: 5 .  collagenase (SANTYL) ointment, Apply 1 application topically daily., Disp: 15 g, Rfl: 0 .  glucose blood (KROGER TEST STRIPS) test strip, by Other route 4 (four) times a day as needed., Disp: , Rfl:  .  insulin aspart (NOVOLOG) 100 UNIT/ML injection, Inject 5 Units into the skin 2 (two) times daily with a meal. , Disp: , Rfl:  .  insulin glargine (LANTUS) 100 UNIT/ML injection, Inject 0.15 mLs (15 Units total) into the skin at bedtime., Disp: 10 mL, Rfl:  1 .  lactobacillus acidophilus & bulgar (LACTINEX) chewable tablet, Chew 1 tablet by mouth 3 (three) times daily with meals., Disp: 90 tablet, Rfl: 0 .  losartan (COZAAR) 50 MG tablet, Take 50 mg by mouth daily., Disp: , Rfl:  .  metoCLOPramide (REGLAN) 5 MG tablet, TAKE ONE TABLET BY MOUTH THREE TIMES DAILY BEFORE MEAL(S), Disp: , Rfl:  .  ondansetron (ZOFRAN ODT) 4 MG disintegrating tablet, Take 1 tablet (4 mg total) by mouth every 8 (eight) hours as needed for nausea or vomiting., Disp: 10 tablet, Rfl: 0 .  oxyCODONE-acetaminophen (PERCOCET/ROXICET) 5-325 MG tablet, Take 1 tablet by mouth every 6 (six) hours as needed., Disp: 10 tablet, Rfl: 0 .  sevelamer carbonate (RENVELA) 800 MG tablet, Take 2,400-3,200 mg by mouth See admin instructions. Take 3 - 4 tablets (2400 mg -3200 mg) by mouth two times daily with meals, Disp: , Rfl:   Social History   Tobacco Use  Smoking Status Never Smoker  Smokeless Tobacco Never Used    No Known Allergies Objective:  There were no vitals filed for this visit. There is no height or weight on file to calculate BMI. Constitutional Well developed. Well nourished.  Vascular Dorsalis pedis pulses palpable bilaterally. Posterior tibial pulses palpable bilaterally. Capillary refill normal to all digits.  No cyanosis or clubbing noted. Pedal hair growth normal.  Neurologic  Normal speech. Oriented to person, place, and time. Epicritic sensation to light touch grossly present bilaterally.  Dermatologic Nails well groomed and normal in appearance. No skin lesions.  Skin edges coapted. Mild maceration. 3x1 granular open ulceration distally without necrosis, erythema, signs of acute infection. No probe to bone.  Orthopedic: Normal joint ROM without pain or crepitus bilaterally. No visible deformities. Amputation noted bilat 5th toe and partial metatarsal No bony tenderness.    Radiographs: None  Assessment:   1. Diabetic ulcer of left midfoot  associated with diabetes mellitus due to underlying condition, limited to breakdown of skin ([place])   2. Post-operative state    Plan:  Patient was evaluated and treated and all questions answered.  S/p L 5th Toe Amputation, Delayed wound closure -Continue Santyl WTD -WBAT in surgical shoes. -No signs of infection noted. -F/u in 1 week for recheck.  Return in about 1 week (around 09/29/2018) for Post-op.

## 2018-10-16 NOTE — Progress Notes (Signed)
Subjective:  Patient ID: [name], male    DOB: 07-10-1981,  MRN: [id number]  Chief Complaint  Patient presents with  . Diabetic Ulcer    s/p fifth left toe amputation - ulcer check   38 y.o. male presents with the above complaint. No new complaints.  Review of Systems: Negative except as noted in the HPI. Denies N/V/F/Ch.  Past Medical History:  Diagnosis Date  . Diabetes mellitus without complication ([place])   . Diabetic gastroparesis ([place])   . Dialysis patient ([place])   . Hypertension   . Renal disorder    Dialysis  . Sepsis ([place])     Current Outpatient Medications:  .  acetaminophen (TYLENOL) 325 MG tablet, Take 2 tablets (650 mg total) by mouth every 6 (six) hours as needed for mild pain or headache (pain/headache). Can restart this after you are done with Oxycodone/Tylenol, Disp: , Rfl:  .  Amino Acids-Protein Hydrolys (FEEDING SUPPLEMENT, PRO-STAT SUGAR FREE 64,) LIQD, Take 30 mLs by mouth 2 (two) times daily., Disp: 900 mL, Rfl: 0 .  amLODipine (NORVASC) 10 MG tablet, Take 10 mg by mouth daily., Disp: , Rfl:  .  cinacalcet (SENSIPAR) 30 MG tablet, Take 30 mg by mouth daily with supper., Disp: , Rfl:  .  clonazePAM (KLONOPIN) 0.5 MG tablet, Take 0.5 mg by mouth daily as needed for anxiety (sleep). , Disp: , Rfl:  .  cloNIDine (CATAPRES - DOSED IN MG/24 HR) 0.3 mg/24hr patch, APPLY ONE PATCH TOPICALLY ONCE A WEEK, Disp: , Rfl:  .  collagenase (SANTYL) ointment, Apply 1 application topically daily., Disp: 15 g, Rfl: 5 .  collagenase (SANTYL) ointment, Apply 1 application topically daily., Disp: 15 g, Rfl: 0 .  glucose blood (KROGER TEST STRIPS) test strip, by Other route 4 (four) times a day as needed., Disp: , Rfl:  .  insulin aspart (NOVOLOG) 100 UNIT/ML injection, Inject 5 Units into the skin 2 (two) times daily with a meal. , Disp: , Rfl:  .  insulin glargine (LANTUS) 100 UNIT/ML injection, Inject 0.15 mLs (15 Units total) into the skin at bedtime., Disp: 10 mL, Rfl:  1 .  lactobacillus acidophilus & bulgar (LACTINEX) chewable tablet, Chew 1 tablet by mouth 3 (three) times daily with meals., Disp: 90 tablet, Rfl: 0 .  losartan (COZAAR) 50 MG tablet, Take 50 mg by mouth daily., Disp: , Rfl:  .  metoCLOPramide (REGLAN) 5 MG tablet, TAKE ONE TABLET BY MOUTH THREE TIMES DAILY BEFORE MEAL(S), Disp: , Rfl:  .  ondansetron (ZOFRAN ODT) 4 MG disintegrating tablet, Take 1 tablet (4 mg total) by mouth every 8 (eight) hours as needed for nausea or vomiting., Disp: 10 tablet, Rfl: 0 .  oxyCODONE-acetaminophen (PERCOCET/ROXICET) 5-325 MG tablet, Take 1 tablet by mouth every 6 (six) hours as needed., Disp: 10 tablet, Rfl: 0 .  sevelamer carbonate (RENVELA) 800 MG tablet, Take 2,400-3,200 mg by mouth See admin instructions. Take 3 - 4 tablets (2400 mg -3200 mg) by mouth two times daily with meals, Disp: , Rfl:   Social History   Tobacco Use  Smoking Status Never Smoker  Smokeless Tobacco Never Used    No Known Allergies Objective:  There were no vitals filed for this visit. There is no height or weight on file to calculate BMI. Constitutional Well developed. Well nourished.  Vascular Dorsalis pedis pulses palpable bilaterally. Posterior tibial pulses palpable bilaterally. Capillary refill normal to all digits.  No cyanosis or clubbing noted. Pedal hair growth normal.  Neurologic  Normal speech. Oriented to person, place, and time. Epicritic sensation to light touch grossly present bilaterally.  Dermatologic Nails well groomed and normal in appearance. No skin lesions.  Skin edges coapted. Mild maceration. 4x1.5 granular open ulceration distally without necrosis, erythema, signs of acute infection. No probe to bone.  Orthopedic: Normal joint ROM without pain or crepitus bilaterally. No visible deformities. Amputation noted bilat 5th toe and partial metatarsal No bony tenderness.    Radiographs: None  Assessment:   1. Diabetic ulcer of left midfoot  associated with diabetes mellitus due to underlying condition, limited to breakdown of skin ([place])   2. Post-operative state    Plan:  Patient was evaluated and treated and all questions answered.  S/p L 5th Toe Amputation, Delayed wound closure -Continue Santyl WTD to be applied qOD by [facility] nurse. -Strict NWB to the left foot.  -No signs of infection noted. -F/u in 1 week for recheck.  No follow-ups on file.

## 2018-10-17 DIAGNOSIS — D509 Iron deficiency anemia, unspecified: Secondary | ICD-10-CM | POA: Diagnosis not present

## 2018-10-17 DIAGNOSIS — N186 End stage renal disease: Secondary | ICD-10-CM | POA: Diagnosis not present

## 2018-10-17 DIAGNOSIS — E1022 Type 1 diabetes mellitus with diabetic chronic kidney disease: Secondary | ICD-10-CM | POA: Diagnosis not present

## 2018-10-17 DIAGNOSIS — R739 Hyperglycemia, unspecified: Secondary | ICD-10-CM | POA: Diagnosis not present

## 2018-10-17 DIAGNOSIS — N2581 Secondary hyperparathyroidism of renal origin: Secondary | ICD-10-CM | POA: Diagnosis not present

## 2018-10-17 DIAGNOSIS — S91302A Unspecified open wound, left foot, initial encounter: Secondary | ICD-10-CM | POA: Diagnosis not present

## 2018-10-19 ENCOUNTER — Ambulatory Visit (INDEPENDENT_AMBULATORY_CARE_PROVIDER_SITE_OTHER): Payer: Medicare Other

## 2018-10-19 ENCOUNTER — Ambulatory Visit (INDEPENDENT_AMBULATORY_CARE_PROVIDER_SITE_OTHER): Payer: Medicare Other | Admitting: Podiatry

## 2018-10-19 ENCOUNTER — Encounter: Payer: Self-pay | Admitting: Podiatry

## 2018-10-19 ENCOUNTER — Telehealth: Payer: Self-pay | Admitting: Podiatry

## 2018-10-19 DIAGNOSIS — E1022 Type 1 diabetes mellitus with diabetic chronic kidney disease: Secondary | ICD-10-CM | POA: Diagnosis not present

## 2018-10-19 DIAGNOSIS — R739 Hyperglycemia, unspecified: Secondary | ICD-10-CM | POA: Diagnosis not present

## 2018-10-19 DIAGNOSIS — L97522 Non-pressure chronic ulcer of other part of left foot with fat layer exposed: Secondary | ICD-10-CM

## 2018-10-19 DIAGNOSIS — E08621 Diabetes mellitus due to underlying condition with foot ulcer: Secondary | ICD-10-CM

## 2018-10-19 DIAGNOSIS — L03116 Cellulitis of left lower limb: Secondary | ICD-10-CM

## 2018-10-19 DIAGNOSIS — N186 End stage renal disease: Secondary | ICD-10-CM | POA: Diagnosis not present

## 2018-10-19 DIAGNOSIS — L97421 Non-pressure chronic ulcer of left heel and midfoot limited to breakdown of skin: Secondary | ICD-10-CM

## 2018-10-19 DIAGNOSIS — N2581 Secondary hyperparathyroidism of renal origin: Secondary | ICD-10-CM | POA: Diagnosis not present

## 2018-10-19 DIAGNOSIS — I739 Peripheral vascular disease, unspecified: Secondary | ICD-10-CM

## 2018-10-19 DIAGNOSIS — D509 Iron deficiency anemia, unspecified: Secondary | ICD-10-CM | POA: Diagnosis not present

## 2018-10-19 DIAGNOSIS — S91302A Unspecified open wound, left foot, initial encounter: Secondary | ICD-10-CM | POA: Diagnosis not present

## 2018-10-19 NOTE — Telephone Encounter (Signed)
[name] at [facility] states pt needs to be seen today for an infection on the left lateral foot. Was given antibiotic at dialysis.

## 2018-10-20 ENCOUNTER — Encounter: Payer: Medicare Other | Admitting: Podiatry

## 2018-10-20 ENCOUNTER — Telehealth: Payer: Self-pay | Admitting: *Deleted

## 2018-10-20 ENCOUNTER — Inpatient Hospital Stay (HOSPITAL_COMMUNITY)
Admission: EM | Admit: 2018-10-20 | Discharge: 2018-10-25 | DRG: 853 | Disposition: A | Discharge status: Home Health | Payer: Medicare Other | Attending: Internal Medicine | Admitting: Internal Medicine

## 2018-10-20 ENCOUNTER — Inpatient Hospital Stay (HOSPITAL_COMMUNITY): Payer: Medicare Other | Admitting: Anesthesiology

## 2018-10-20 ENCOUNTER — Telehealth: Payer: Self-pay | Admitting: Podiatry

## 2018-10-20 ENCOUNTER — Emergency Department (HOSPITAL_COMMUNITY): Payer: Medicare Other

## 2018-10-20 ENCOUNTER — Encounter (HOSPITAL_COMMUNITY)
Admission: EM | Disposition: A | Discharge status: Home Health | Payer: Self-pay | Source: Home / Self Care | Attending: Internal Medicine

## 2018-10-20 ENCOUNTER — Encounter (HOSPITAL_COMMUNITY): Payer: Self-pay | Admitting: Internal Medicine

## 2018-10-20 DIAGNOSIS — L03119 Cellulitis of unspecified part of limb: Secondary | ICD-10-CM

## 2018-10-20 DIAGNOSIS — E109 Type 1 diabetes mellitus without complications: Secondary | ICD-10-CM | POA: Diagnosis present

## 2018-10-20 DIAGNOSIS — E1365 Other specified diabetes mellitus with hyperglycemia: Secondary | ICD-10-CM

## 2018-10-20 DIAGNOSIS — D631 Anemia in chronic kidney disease: Secondary | ICD-10-CM | POA: Diagnosis not present

## 2018-10-20 DIAGNOSIS — E871 Hypo-osmolality and hyponatremia: Secondary | ICD-10-CM | POA: Diagnosis present

## 2018-10-20 DIAGNOSIS — N186 End stage renal disease: Secondary | ICD-10-CM | POA: Diagnosis not present

## 2018-10-20 DIAGNOSIS — L02619 Cutaneous abscess of unspecified foot: Secondary | ICD-10-CM | POA: Diagnosis not present

## 2018-10-20 DIAGNOSIS — E1043 Type 1 diabetes mellitus with diabetic autonomic (poly)neuropathy: Secondary | ICD-10-CM | POA: Diagnosis present

## 2018-10-20 DIAGNOSIS — Z79899 Other long term (current) drug therapy: Secondary | ICD-10-CM | POA: Diagnosis not present

## 2018-10-20 DIAGNOSIS — A4101 Sepsis due to Methicillin susceptible Staphylococcus aureus: Secondary | ICD-10-CM | POA: Diagnosis not present

## 2018-10-20 DIAGNOSIS — L089 Local infection of the skin and subcutaneous tissue, unspecified: Secondary | ICD-10-CM

## 2018-10-20 DIAGNOSIS — L03116 Cellulitis of left lower limb: Secondary | ICD-10-CM | POA: Diagnosis not present

## 2018-10-20 DIAGNOSIS — Z9111 Patient's noncompliance with dietary regimen: Secondary | ICD-10-CM

## 2018-10-20 DIAGNOSIS — Z992 Dependence on renal dialysis: Secondary | ICD-10-CM | POA: Diagnosis not present

## 2018-10-20 DIAGNOSIS — R112 Nausea with vomiting, unspecified: Secondary | ICD-10-CM | POA: Diagnosis not present

## 2018-10-20 DIAGNOSIS — I10 Essential (primary) hypertension: Secondary | ICD-10-CM

## 2018-10-20 DIAGNOSIS — E08621 Diabetes mellitus due to underlying condition with foot ulcer: Secondary | ICD-10-CM

## 2018-10-20 DIAGNOSIS — E11628 Type 2 diabetes mellitus with other skin complications: Secondary | ICD-10-CM | POA: Diagnosis not present

## 2018-10-20 DIAGNOSIS — A419 Sepsis, unspecified organism: Secondary | ICD-10-CM | POA: Diagnosis not present

## 2018-10-20 DIAGNOSIS — N2581 Secondary hyperparathyroidism of renal origin: Secondary | ICD-10-CM | POA: Diagnosis not present

## 2018-10-20 DIAGNOSIS — E1042 Type 1 diabetes mellitus with diabetic polyneuropathy: Secondary | ICD-10-CM | POA: Diagnosis present

## 2018-10-20 DIAGNOSIS — E878 Other disorders of electrolyte and fluid balance, not elsewhere classified: Secondary | ICD-10-CM | POA: Diagnosis present

## 2018-10-20 DIAGNOSIS — R159 Full incontinence of feces: Secondary | ICD-10-CM | POA: Diagnosis present

## 2018-10-20 DIAGNOSIS — E1022 Type 1 diabetes mellitus with diabetic chronic kidney disease: Secondary | ICD-10-CM | POA: Diagnosis not present

## 2018-10-20 DIAGNOSIS — L97523 Non-pressure chronic ulcer of other part of left foot with necrosis of muscle: Secondary | ICD-10-CM | POA: Diagnosis not present

## 2018-10-20 DIAGNOSIS — E11621 Type 2 diabetes mellitus with foot ulcer: Secondary | ICD-10-CM | POA: Diagnosis not present

## 2018-10-20 DIAGNOSIS — I96 Gangrene, not elsewhere classified: Secondary | ICD-10-CM | POA: Diagnosis not present

## 2018-10-20 DIAGNOSIS — E1349 Other specified diabetes mellitus with other diabetic neurological complication: Secondary | ICD-10-CM | POA: Diagnosis not present

## 2018-10-20 DIAGNOSIS — L02612 Cutaneous abscess of left foot: Secondary | ICD-10-CM | POA: Diagnosis not present

## 2018-10-20 DIAGNOSIS — Z833 Family history of diabetes mellitus: Secondary | ICD-10-CM

## 2018-10-20 DIAGNOSIS — L97529 Non-pressure chronic ulcer of other part of left foot with unspecified severity: Secondary | ICD-10-CM | POA: Diagnosis not present

## 2018-10-20 DIAGNOSIS — M869 Osteomyelitis, unspecified: Secondary | ICD-10-CM

## 2018-10-20 DIAGNOSIS — E10628 Type 1 diabetes mellitus with other skin complications: Secondary | ICD-10-CM | POA: Diagnosis present

## 2018-10-20 DIAGNOSIS — E108 Type 1 diabetes mellitus with unspecified complications: Secondary | ICD-10-CM | POA: Diagnosis present

## 2018-10-20 DIAGNOSIS — L03032 Cellulitis of left toe: Secondary | ICD-10-CM | POA: Diagnosis not present

## 2018-10-20 DIAGNOSIS — IMO0002 Reserved for concepts with insufficient information to code with codable children: Secondary | ICD-10-CM

## 2018-10-20 DIAGNOSIS — E1143 Type 2 diabetes mellitus with diabetic autonomic (poly)neuropathy: Secondary | ICD-10-CM | POA: Diagnosis not present

## 2018-10-20 DIAGNOSIS — K3184 Gastroparesis: Secondary | ICD-10-CM | POA: Diagnosis present

## 2018-10-20 DIAGNOSIS — R9431 Abnormal electrocardiogram [ECG] [EKG]: Secondary | ICD-10-CM | POA: Diagnosis not present

## 2018-10-20 DIAGNOSIS — Z4781 Encounter for orthopedic aftercare following surgical amputation: Secondary | ICD-10-CM | POA: Diagnosis not present

## 2018-10-20 DIAGNOSIS — E10621 Type 1 diabetes mellitus with foot ulcer: Secondary | ICD-10-CM | POA: Diagnosis present

## 2018-10-20 DIAGNOSIS — E1065 Type 1 diabetes mellitus with hyperglycemia: Secondary | ICD-10-CM | POA: Diagnosis present

## 2018-10-20 DIAGNOSIS — E119 Type 2 diabetes mellitus without complications: Secondary | ICD-10-CM

## 2018-10-20 DIAGNOSIS — L97426 Non-pressure chronic ulcer of left heel and midfoot with bone involvement without evidence of necrosis: Secondary | ICD-10-CM

## 2018-10-20 DIAGNOSIS — M009 Pyogenic arthritis, unspecified: Secondary | ICD-10-CM | POA: Diagnosis not present

## 2018-10-20 DIAGNOSIS — R509 Fever, unspecified: Secondary | ICD-10-CM | POA: Diagnosis not present

## 2018-10-20 DIAGNOSIS — R1110 Vomiting, unspecified: Secondary | ICD-10-CM | POA: Diagnosis not present

## 2018-10-20 DIAGNOSIS — E1052 Type 1 diabetes mellitus with diabetic peripheral angiopathy with gangrene: Secondary | ICD-10-CM | POA: Diagnosis present

## 2018-10-20 DIAGNOSIS — Z89421 Acquired absence of other right toe(s): Secondary | ICD-10-CM

## 2018-10-20 DIAGNOSIS — E10649 Type 1 diabetes mellitus with hypoglycemia without coma: Secondary | ICD-10-CM | POA: Diagnosis not present

## 2018-10-20 DIAGNOSIS — D72829 Elevated white blood cell count, unspecified: Secondary | ICD-10-CM | POA: Diagnosis not present

## 2018-10-20 DIAGNOSIS — Z794 Long term (current) use of insulin: Secondary | ICD-10-CM | POA: Diagnosis not present

## 2018-10-20 DIAGNOSIS — R197 Diarrhea, unspecified: Secondary | ICD-10-CM

## 2018-10-20 DIAGNOSIS — E118 Type 2 diabetes mellitus with unspecified complications: Secondary | ICD-10-CM | POA: Diagnosis present

## 2018-10-20 DIAGNOSIS — I120 Hypertensive chronic kidney disease with stage 5 chronic kidney disease or end stage renal disease: Secondary | ICD-10-CM | POA: Diagnosis not present

## 2018-10-20 HISTORY — PX: I&D EXTREMITY: SHX5045

## 2018-10-20 LAB — CBC
HCT: 41.1 % (ref 39.0–52.0)
Hemoglobin: 12.8 g/dL — ABNORMAL LOW (ref 13.0–17.0)
MCH: 29.6 pg (ref 26.0–34.0)
MCHC: 31.1 g/dL (ref 30.0–36.0)
MCV: 95.1 fL (ref 80.0–100.0)
Platelets: 166 10*3/uL (ref 150–400)
RBC: 4.32 MIL/uL (ref 4.22–5.81)
RDW: 14.6 % (ref 11.5–15.5)
WBC: 26.3 10*3/uL — ABNORMAL HIGH (ref 4.0–10.5)
nRBC: 0 % (ref 0.0–0.2)

## 2018-10-20 LAB — COMPREHENSIVE METABOLIC PANEL
ALT: 14 U/L (ref 0–44)
AST: 23 U/L (ref 15–41)
Albumin: 2.8 g/dL — ABNORMAL LOW (ref 3.5–5.0)
Alkaline Phosphatase: 162 U/L — ABNORMAL HIGH (ref 38–126)
Anion gap: 16 — ABNORMAL HIGH (ref 5–15)
BUN: 31 mg/dL — ABNORMAL HIGH (ref 6–20)
CO2: 25 mmol/L (ref 22–32)
Calcium: 9.4 mg/dL (ref 8.9–10.3)
Chloride: 89 mmol/L — ABNORMAL LOW (ref 98–111)
Creatinine, Ser: 8.82 mg/dL — ABNORMAL HIGH (ref 0.61–1.24)
GFR calc Af Amer: 8 mL/min — ABNORMAL LOW (ref >60)
GFR calc non Af Amer: 7 mL/min — ABNORMAL LOW (ref >60)
Glucose, Bld: 210 mg/dL — ABNORMAL HIGH (ref 70–99)
Potassium: 4.3 mmol/L (ref 3.5–5.1)
Sodium: 130 mmol/L — ABNORMAL LOW (ref 135–145)
Total Bilirubin: 1.3 mg/dL — ABNORMAL HIGH (ref 0.3–1.2)
Total Protein: 7.5 g/dL (ref 6.5–8.1)

## 2018-10-20 LAB — GLUCOSE, CAPILLARY
Glucose-Capillary: 322 mg/dL — ABNORMAL HIGH (ref 70–99)
Glucose-Capillary: 250 mg/dL — ABNORMAL HIGH (ref 70–99)
Glucose-Capillary: 239 mg/dL — ABNORMAL HIGH (ref 70–99)
Glucose-Capillary: 178 mg/dL — ABNORMAL HIGH (ref 70–99)

## 2018-10-20 LAB — LACTIC ACID, PLASMA
Lactic Acid, Venous: 1.2 mmol/L (ref 0.5–1.9)
Lactic Acid, Venous: 1.4 mmol/L (ref 0.5–1.9)

## 2018-10-20 LAB — POCT I-STAT EG7
Acid-Base Excess: 1 mmol/L (ref 0.0–2.0)
Bicarbonate: 26.4 mmol/L (ref 20.0–28.0)
Calcium, Ion: 1.2 mmol/L (ref 1.15–1.40)
HCT: 48 % (ref 39.0–52.0)
Hemoglobin: 16.3 g/dL (ref 13.0–17.0)
O2 Saturation: 60 %
Potassium: 4.5 mmol/L (ref 3.5–5.1)
Sodium: 127 mmol/L — ABNORMAL LOW (ref 135–145)
TCO2: 28 mmol/L (ref 22–32)
pCO2, Ven: 45.7 mmHg (ref 44.0–60.0)
pH, Ven: 7.37 (ref 7.250–7.430)
pO2, Ven: 33 mmHg (ref 32.0–45.0)

## 2018-10-20 LAB — C DIFFICILE QUICK SCREEN W PCR REFLEX
C Diff antigen: NEGATIVE
C Diff interpretation: NOT DETECTED
C Diff toxin: NEGATIVE

## 2018-10-20 LAB — LIPASE, BLOOD: Lipase: 28 U/L (ref 11–51)

## 2018-10-20 SURGERY — IRRIGATION AND DEBRIDEMENT EXTREMITY
Anesthesia: General | Site: Foot | Laterality: Left

## 2018-10-20 MED ORDER — SODIUM CHLORIDE 0.9 % IV BOLUS
250.0000 mL | Freq: Once | INTRAVENOUS | Status: AC
  Administered 2018-10-20: 250 mL via INTRAVENOUS

## 2018-10-20 MED ORDER — LIDOCAINE 2% (20 MG/ML) 5 ML SYRINGE
INTRAMUSCULAR | Status: DC | PRN
  Administered 2018-10-20: 100 mg via INTRAVENOUS

## 2018-10-20 MED ORDER — SODIUM CHLORIDE 0.9 % IV SOLN
INTRAVENOUS | Status: DC
  Administered 2018-10-20: 19:00:00 via INTRAVENOUS

## 2018-10-20 MED ORDER — MIDAZOLAM HCL 5 MG/5ML IJ SOLN
INTRAMUSCULAR | Status: DC | PRN
  Administered 2018-10-20 (×2): 1 mg via INTRAVENOUS

## 2018-10-20 MED ORDER — FAMOTIDINE IN NACL 20-0.9 MG/50ML-% IV SOLN
20.0000 mg | Freq: Two times a day (BID) | INTRAVENOUS | Status: DC
  Filled 2018-10-20 (×2): qty 50

## 2018-10-20 MED ORDER — PROPOFOL 10 MG/ML IV BOLUS
INTRAVENOUS | Status: AC
  Filled 2018-10-20: qty 20

## 2018-10-20 MED ORDER — ALBUTEROL SULFATE (2.5 MG/3ML) 0.083% IN NEBU: 2.5000 mg | INHALATION_SOLUTION | RESPIRATORY_TRACT | Status: DC | PRN

## 2018-10-20 MED ORDER — SUCCINYLCHOLINE CHLORIDE 20 MG/ML IJ SOLN
INTRAMUSCULAR | Status: DC | PRN
  Administered 2018-10-20: 120 mg via INTRAVENOUS

## 2018-10-20 MED ORDER — 0.9 % SODIUM CHLORIDE (POUR BTL) OPTIME
TOPICAL | Status: DC | PRN
  Administered 2018-10-20: 1000 mL

## 2018-10-20 MED ORDER — ACETAMINOPHEN 325 MG PO TABS
650.0000 mg | ORAL_TABLET | Freq: Four times a day (QID) | ORAL | Status: DC | PRN
  Administered 2018-10-25: 650 mg via ORAL
  Filled 2018-10-20: qty 2

## 2018-10-20 MED ORDER — INSULIN ASPART 100 UNIT/ML ~~LOC~~ SOLN
3.0000 [IU] | Freq: Once | SUBCUTANEOUS | Status: AC
  Administered 2018-10-20: 3 [IU] via SUBCUTANEOUS

## 2018-10-20 MED ORDER — SODIUM CHLORIDE 0.9 % IV SOLN
2.0000 g | Freq: Once | INTRAVENOUS | Status: DC
  Filled 2018-10-20: qty 2

## 2018-10-20 MED ORDER — SODIUM CHLORIDE 0.9% FLUSH: 3.0000 mL | Freq: Once | INTRAVENOUS | Status: DC

## 2018-10-20 MED ORDER — MIDAZOLAM HCL 2 MG/2ML IJ SOLN
INTRAMUSCULAR | Status: AC
  Filled 2018-10-20: qty 2

## 2018-10-20 MED ORDER — INSULIN GLARGINE 100 UNIT/ML ~~LOC~~ SOLN
10.0000 [IU] | Freq: Every day | SUBCUTANEOUS | Status: DC
  Filled 2018-10-20 (×2): qty 0.1

## 2018-10-20 MED ORDER — VANCOMYCIN HCL 1000 MG IV SOLR
INTRAVENOUS | Status: AC
  Filled 2018-10-20: qty 1000

## 2018-10-20 MED ORDER — SODIUM CHLORIDE 0.9 % IR SOLN
Status: DC | PRN
  Administered 2018-10-20: 3000 mL

## 2018-10-20 MED ORDER — METOCLOPRAMIDE HCL 5 MG/ML IJ SOLN
5.0000 mg | Freq: Once | INTRAMUSCULAR | Status: AC
  Administered 2018-10-20: 5 mg via INTRAVENOUS
  Filled 2018-10-20: qty 2

## 2018-10-20 MED ORDER — PROMETHAZINE HCL 25 MG/ML IJ SOLN: 6.2500 mg | INTRAMUSCULAR | Status: DC | PRN

## 2018-10-20 MED ORDER — FENTANYL CITRATE (PF) 100 MCG/2ML IJ SOLN
25.0000 ug | INTRAMUSCULAR | Status: DC | PRN
  Administered 2018-10-20: 50 ug via INTRAVENOUS

## 2018-10-20 MED ORDER — INSULIN ASPART 100 UNIT/ML ~~LOC~~ SOLN
0.0000 [IU] | SUBCUTANEOUS | Status: DC
  Administered 2018-10-20: 2 [IU] via SUBCUTANEOUS
  Administered 2018-10-20: 7 [IU] via SUBCUTANEOUS
  Administered 2018-10-21: 1 [IU] via SUBCUTANEOUS

## 2018-10-20 MED ORDER — FENTANYL CITRATE (PF) 100 MCG/2ML IJ SOLN
INTRAMUSCULAR | Status: AC
  Filled 2018-10-20: qty 2

## 2018-10-20 MED ORDER — BUPIVACAINE HCL (PF) 0.5 % IJ SOLN
INTRAMUSCULAR | Status: DC | PRN
  Administered 2018-10-20: 20 mL

## 2018-10-20 MED ORDER — SODIUM CHLORIDE 0.9 % IV SOLN
2.0000 g | INTRAVENOUS | Status: DC
  Administered 2018-10-21: 2 g via INTRAVENOUS
  Filled 2018-10-20: qty 2

## 2018-10-20 MED ORDER — VANCOMYCIN HCL IN DEXTROSE 1-5 GM/200ML-% IV SOLN
1000.0000 mg | INTRAVENOUS | Status: DC
  Administered 2018-10-21: 1000 mg via INTRAVENOUS
  Filled 2018-10-20: qty 200

## 2018-10-20 MED ORDER — PROMETHAZINE HCL 25 MG/ML IJ SOLN: 12.5000 mg | Freq: Four times a day (QID) | INTRAMUSCULAR | Status: DC | PRN

## 2018-10-20 MED ORDER — VANCOMYCIN HCL 1000 MG IV SOLR
INTRAVENOUS | Status: DC | PRN
  Administered 2018-10-20: 1000 mg

## 2018-10-20 MED ORDER — FENTANYL CITRATE (PF) 100 MCG/2ML IJ SOLN
INTRAMUSCULAR | Status: DC | PRN
  Administered 2018-10-20: 50 ug via INTRAVENOUS
  Administered 2018-10-20: 100 ug via INTRAVENOUS

## 2018-10-20 MED ORDER — INSULIN ASPART 100 UNIT/ML ~~LOC~~ SOLN
SUBCUTANEOUS | Status: AC
  Filled 2018-10-20: qty 1

## 2018-10-20 MED ORDER — ONDANSETRON HCL 4 MG/2ML IJ SOLN
4.0000 mg | Freq: Four times a day (QID) | INTRAMUSCULAR | Status: DC | PRN
  Administered 2018-10-22: 4 mg via INTRAVENOUS
  Filled 2018-10-20 (×2): qty 2

## 2018-10-20 MED ORDER — OXYCODONE HCL 5 MG PO TABS: 5.0000 mg | ORAL_TABLET | Freq: Once | ORAL | Status: DC | PRN

## 2018-10-20 MED ORDER — METRONIDAZOLE IN NACL 5-0.79 MG/ML-% IV SOLN
500.0000 mg | Freq: Three times a day (TID) | INTRAVENOUS | Status: DC
  Administered 2018-10-20 – 2018-10-22 (×4): 500 mg via INTRAVENOUS
  Filled 2018-10-20 (×9): qty 100

## 2018-10-20 MED ORDER — VANCOMYCIN HCL 10 G IV SOLR
1500.0000 mg | Freq: Once | INTRAVENOUS | Status: DC
  Filled 2018-10-20: qty 1500

## 2018-10-20 MED ORDER — PROPOFOL 10 MG/ML IV BOLUS
INTRAVENOUS | Status: DC | PRN
  Administered 2018-10-20: 200 mg via INTRAVENOUS
  Administered 2018-10-20: 30 mg via INTRAVENOUS

## 2018-10-20 MED ORDER — SODIUM CHLORIDE 0.9 % IV SOLN
INTRAVENOUS | Status: DC | PRN
  Administered 2018-10-20: 20:00:00 via INTRAVENOUS

## 2018-10-20 MED ORDER — HEPARIN SODIUM (PORCINE) 5000 UNIT/ML IJ SOLN
5000.0000 [IU] | Freq: Three times a day (TID) | INTRAMUSCULAR | Status: DC
  Administered 2018-10-21: 5000 [IU] via SUBCUTANEOUS
  Filled 2018-10-20 (×7): qty 1

## 2018-10-20 MED ORDER — ONDANSETRON HCL 4 MG/2ML IJ SOLN
INTRAMUSCULAR | Status: DC | PRN
  Administered 2018-10-20: 4 mg via INTRAVENOUS

## 2018-10-20 MED ORDER — OXYCODONE HCL 5 MG/5ML PO SOLN: 5.0000 mg | Freq: Once | ORAL | Status: DC | PRN

## 2018-10-20 MED ORDER — ONDANSETRON HCL 4 MG PO TABS: 4.0000 mg | ORAL_TABLET | Freq: Four times a day (QID) | ORAL | Status: DC | PRN

## 2018-10-20 MED ORDER — ACETAMINOPHEN 650 MG RE SUPP: 650.0000 mg | Freq: Four times a day (QID) | RECTAL | Status: DC | PRN

## 2018-10-20 MED ORDER — FENTANYL CITRATE (PF) 250 MCG/5ML IJ SOLN
INTRAMUSCULAR | Status: AC
  Filled 2018-10-20: qty 5

## 2018-10-20 MED ORDER — BUPIVACAINE HCL (PF) 0.5 % IJ SOLN
INTRAMUSCULAR | Status: AC
  Filled 2018-10-20: qty 30

## 2018-10-20 SURGICAL SUPPLY — 45 items
BANDAGE ACE 4X5 VEL STRL LF (GAUZE/BANDAGES/DRESSINGS) IMPLANT
BANDAGE ACE 6X5 VEL STRL LF (GAUZE/BANDAGES/DRESSINGS) ×3 IMPLANT
BNDG ESMARK 4X9 LF (GAUZE/BANDAGES/DRESSINGS) IMPLANT
BNDG GAUZE ELAST 4 BULKY (GAUZE/BANDAGES/DRESSINGS) ×3 IMPLANT
CHLORAPREP W/TINT 26ML (MISCELLANEOUS) ×3 IMPLANT
COVER SURGICAL LIGHT HANDLE (MISCELLANEOUS) ×3 IMPLANT
COVER WAND RF STERILE (DRAPES) ×3 IMPLANT
CUFF TOURNIQUET SINGLE 18IN (TOURNIQUET CUFF) IMPLANT
CUFF TOURNIQUET SINGLE 34IN LL (TOURNIQUET CUFF) IMPLANT
DRAPE U-SHAPE 47X51 STRL (DRAPES) ×3 IMPLANT
DRSG PAD ABDOMINAL 8X10 ST (GAUZE/BANDAGES/DRESSINGS) ×3 IMPLANT
ELECT CAUTERY BLADE 6.4 (BLADE) ×3 IMPLANT
ELECT REM PT RETURN 9FT ADLT (ELECTROSURGICAL) ×3
ELECTRODE REM PT RTRN 9FT ADLT (ELECTROSURGICAL) ×1 IMPLANT
GAUZE SPONGE 4X4 12PLY STRL (GAUZE/BANDAGES/DRESSINGS) ×3 IMPLANT
GLOVE BIO SURGEON STRL SZ7.5 (GLOVE) ×3 IMPLANT
GLOVE BIOGEL PI IND STRL 8 (GLOVE) ×1 IMPLANT
GLOVE BIOGEL PI INDICATOR 8 (GLOVE) ×2
GOWN STRL REUS W/ TWL LRG LVL3 (GOWN DISPOSABLE) ×1 IMPLANT
GOWN STRL REUS W/ TWL XL LVL3 (GOWN DISPOSABLE) ×1 IMPLANT
GOWN STRL REUS W/TWL LRG LVL3 (GOWN DISPOSABLE) ×2
GOWN STRL REUS W/TWL XL LVL3 (GOWN DISPOSABLE) ×2
KIT BASIN OR (CUSTOM PROCEDURE TRAY) ×3 IMPLANT
KIT TURNOVER KIT B (KITS) ×3 IMPLANT
MANIFOLD NEPTUNE II (INSTRUMENTS) ×3 IMPLANT
NEEDLE BIOPSY JAMSHIDI 8X6 (NEEDLE) IMPLANT
NEEDLE HYPO 25GX1X1/2 BEV (NEEDLE) IMPLANT
NS IRRIG 1000ML POUR BTL (IV SOLUTION) ×3 IMPLANT
PACK ORTHO EXTREMITY (CUSTOM PROCEDURE TRAY) ×3 IMPLANT
PAD ARMBOARD 7.5X6 YLW CONV (MISCELLANEOUS) ×6 IMPLANT
PROBE DEBRIDE SONICVAC MISONIX (TIP) IMPLANT
SCRUB BETADINE 4OZ XXX (MISCELLANEOUS) ×3 IMPLANT
SCRUB POVIDONE IODINE 4 OZ (MISCELLANEOUS) ×3 IMPLANT
SET CYSTO W/LG BORE CLAMP LF (SET/KITS/TRAYS/PACK) ×3 IMPLANT
SOL PREP POV-IOD 4OZ 10% (MISCELLANEOUS) ×6 IMPLANT
STAPLER VISISTAT 35W (STAPLE) ×3 IMPLANT
SUT ETHILON 3 0 PS 1 (SUTURE) ×3 IMPLANT
SWAB COLLECTION DEVICE MRSA (MISCELLANEOUS) ×3 IMPLANT
SWAB CULTURE ESWAB REG 1ML (MISCELLANEOUS) ×3 IMPLANT
SYR CONTROL 10ML LL (SYRINGE) IMPLANT
TOWEL GREEN STERILE (TOWEL DISPOSABLE) ×3 IMPLANT
TOWEL GREEN STERILE FF (TOWEL DISPOSABLE) ×3 IMPLANT
TUBE CONNECTING 12'X1/4 (SUCTIONS) ×1
TUBE CONNECTING 12X1/4 (SUCTIONS) ×2 IMPLANT
YANKAUER SUCT BULB TIP NO VENT (SUCTIONS) ×3 IMPLANT

## 2018-10-20 NOTE — Telephone Encounter (Signed)
Will be seeing patient in hospital

## 2018-10-20 NOTE — Anesthesia Postprocedure Evaluation (Signed)
Anesthesia Post Note  Patient: [name]  Procedure(s) Performed: IRRIGATION AND DEBRIDEMENT LEFT FOOT  DEBRIDEMENT LATERAL FOOT WOUND (Left Foot)     Patient location during evaluation: PACU Anesthesia Type: General Level of consciousness: awake and alert Pain management: pain level controlled Vital Signs Assessment: post-procedure vital signs reviewed and stable Respiratory status: spontaneous breathing, nonlabored ventilation and respiratory function stable Cardiovascular status: blood pressure returned to baseline and stable Postop Assessment: no apparent nausea or vomiting Anesthetic complications: no    Last Vitals:  Vitals:   10/20/18 2215 10/20/18 2236  BP:  135/74  Pulse: 81 80  Resp: 20   Temp:  36.8 C  SpO2: 98% 100%    Last Pain:  Vitals:   10/20/18 2236  TempSrc: Oral  PainSc:                  Audry Pili

## 2018-10-20 NOTE — Progress Notes (Signed)
Pharmacy Antibiotic Note  [name] is a 38 y.o. male admitted on 10/20/2018 with sepsis.  Pharmacy has been consulted for vancomycin and cefepime dosing. Pt is afebrile and WBC is elevated at 26.3. Pt received vancomycin 1500mg  and ceftazidime 2g at HD yesterday.   Plan: Vancomycin 1gm QHD Cefepime 2gm QHD F/u renal plans, C&S, clinical status and pre-HD vanc level when appropriate     Temp (24hrs), Avg:98.2 F (36.8 C), Min:98.2 F (36.8 C), Max:98.2 F (36.8 C)  Recent Labs  Lab 10/20/18 1013  WBC 26.3*  CREATININE 8.82*    CrCl cannot be calculated (Unknown ideal weight.).    No Known Allergies  Antimicrobials this admission: Vanc 1/22>> Cefepime 1/24>> Ceftaz x 1 1/22  Dose adjustments this admission: N/A  Microbiology results: Pending  Thank you for allowing pharmacy to be a part of this patient's care.  Timoteo Carreiro, Rande Lawman 10/20/2018 2:10 PM

## 2018-10-20 NOTE — Op Note (Addendum)
Patient Name: [name] DOB: December 17, 1980  MRN: [id number]   Date of Service: 10/20/18    Surgeon: Dr. Hardie Pulley, DPM Assistants: None Pre-operative Diagnosis: Cellulitis and Abscess of Foot, Diabetic Foot infection Post-operative Diagnosis: same Procedures:             1) Incision and Drainage of Abscess below the deep fascia, complicated  2) Debridement and Irrigation of lateral foot wound Pathology/Specimens: ID Type Source Tests Collected by Time Destination  A : left foot abscess  Abscess Abscess GRAM STAIN, AEROBIC/ANAEROBIC CULTURE (SURGICAL/DEEP WOUND) Evelina Bucy, DPM 10/20/2018 2037    Anesthesia: General Hemostasis: Anatomic Estimated Blood Loss: 10 ml Materials: None Medications: 1g Vancomycin powder Complications: None  Indications for Procedure:  This is a 38 y.o. male who presented to the ED today with fevers, nausea and vomiting,    Procedure in Detail: Patient was identified in pre-operative holding area. Formal consent was signed and the left lower extremity was marked. Patient was brought back to the operating room and placed on the operating room table in the supine position. Anesthesia was induced.   The extremity was prepped and draped in the usual sterile fashion. Timeout was taken to confirm patient name, laterality, and procedure prior to incision. Attention was then directed to the left foot great toe area. There was epidermolysis of the great toe. The skin around the great toe was deroofed. There was a noted sinus about the medial aspect of the hallux this area was explored with a freer and was found to probe deep with return of purulence. A linear incision was then made in this area at the medial 1st MPJ. Dissection was carried to the joint capsule. There was some purulence in the area but overall the tissue did appear healthy and viable. The joint capsule was not violated. There was communication noted in the 1st interspace. This area was explored  and a significant amount of purulence was noted in this area. This was collected for culture. Attention was then directed to the area deep in the distal first interspace where this was explored and again purulence was encountered.  All purulence was maximally expressed.  Attention was then directed to the dorsal second interspace with a Freer where additional purulence was noted.  After all purulence was maximally expressed the area was copiously irrigated with 3 L of normal saline.  The area was then packed open with vancomycin impregnated iodoform. The wound medially was loosely approximated with skin staples.   Attention was then directed to the lateral foot wound at the area of the previous amputation where hyperkeratosis was noted overlying the wound.  The wound was debrided sharply and excisionally with a 10 blade down to bleeding viable wound base. Post-debridement the wound measured approximately 6x0.3x0.1. There was no purulence, no necrosis, there were no signs of infection and no communication with the first metatarsal wound area.  The foot was then dressed with 4x4, kerlix, and ACE bandage. Patient tolerated the procedure well.   Disposition: Following a period of post-operative monitoring, patient will be transferred back to the floor.  He would likely benefit from return to operating room for repeat washout.  It is questionable this time whether the hallux will be viable as there is significant amount of purulence although the deeper tissue did appear somewhat viable.

## 2018-10-20 NOTE — Anesthesia Preprocedure Evaluation (Addendum)
Anesthesia Evaluation  Patient identified by MRN, date of birth, ID band Patient awake    Reviewed: Allergy & Precautions, NPO status , Patient's Chart, lab work & pertinent test results  History of Anesthesia Complications Negative for: history of anesthetic complications  Airway Mallampati: II  TM Distance: >3 FB Neck ROM: Full    Dental  (+) Dental Advisory Given   Pulmonary neg pulmonary ROS,    breath sounds clear to auscultation       Cardiovascular hypertension,  Rhythm:Regular Rate:Normal     Neuro/Psych negative neurological ROS  negative psych ROS   GI/Hepatic Neg liver ROS,  Diabetic gastroparesis    Endo/Other  diabetes, Poorly Controlled, Insulin Dependent Hyponatremia Hypochloremia   Renal/GU ESRF and DialysisRenal disease     Musculoskeletal negative musculoskeletal ROS (+)   Abdominal   Peds  Hematology  Leukocytosis    Anesthesia Other Findings   Reproductive/Obstetrics                            Anesthesia Physical Anesthesia Plan  ASA: IV and emergent  Anesthesia Plan: General   Post-op Pain Management:    Induction: Intravenous, Rapid sequence and Cricoid pressure planned  PONV Risk Score and Plan: 2 and Treatment may vary due to age or medical condition, Ondansetron and Midazolam  Airway Management Planned: Oral ETT  Additional Equipment: None  Intra-op Plan:   Post-operative Plan: Extubation in OR  Informed Consent: I have reviewed the patients History and Physical, chart, labs and discussed the procedure including the risks, benefits and alternatives for the proposed anesthesia with the patient or authorized representative who has indicated his/her understanding and acceptance.     Dental advisory given  Plan Discussed with: CRNA and Anesthesiologist  Anesthesia Plan Comments:        Anesthesia Quick Evaluation

## 2018-10-20 NOTE — Telephone Encounter (Signed)
Pt called wanting to make Dr. March Rummage aware that he is about to be admitted into the hospital. Pt requested that Dr. come to see him.

## 2018-10-20 NOTE — ED Provider Notes (Signed)
[facility] EMERGENCY DEPARTMENT Provider Note   CSN: [id number] Arrival date & time: 10/20/18  9326     History   Chief Complaint Chief Complaint  Patient presents with  . Vomiting    HPI [name] is a 38 y.o. male.  HPI   Patient is a 19 male with a history of type 1 diabetes mellitus, ESRD on dialysis Monday-Wednesday-Friday, diabetic foot ulcers, diabetic gastroparesis, hypertension presenting for nausea, vomiting, diarrhea and intermittent fevers.  Patient reports that his symptoms began 2 to 3 days ago.  He reports that for the past 2 days he has not been able to keep anything down by mouth.  He did take his long-acting insulin yesterday but is not taking his short acting insulin.  He reports that over the past 2 days he has had diarrhea overnight "every 10 minutes" without melena or hematochezia.  Emesis is been 1-2 times a day when he attempts to eat something for the past 3 days without bilious or bloody vomiting.  Patient ports when he is at dialysis yesterday his temperature was 101.  Past Medical History:  Diagnosis Date  . Diabetes mellitus without complication ([place])   . Diabetic gastroparesis ([place])   . Dialysis patient ([place])   . Hypertension   . Renal disorder    Dialysis  . Sepsis [facility])     Patient Active Problem List   Diagnosis Date Noted  . Clotted dialysis access [facility])   . Encounter for planned post-operative wound closure   . Osteomyelitis of foot, left, acute ([place])   . Acute osteomyelitis of left ankle or foot ([place])   . Osteomyelitis of fifth toe of left foot ([place])   . Diabetic ulcer of left midfoot associated with diabetes mellitus due to underlying condition, with necrosis of bone ([place])   . Anemia of chronic disease 08/18/2018  . ESRD (end stage renal disease) ([place]) 05/23/2018  . Hyperkalemia   . Clotted renal dialysis AV graft ([place])   . Infected skin ulcer with necrosis of bone ([place])   . Osteomyelitis ([place]) 02/01/2018  .  Delirium   . Altered mental status   . Ulcer of right foot limited to breakdown of skin ([place])   . Osteomyelitis of ankle or foot, acute, right ([place]) 10/30/2017  . Cellulitis and abscess of foot   . Cellulitis of right lower extremity   . Right foot infection   . Diabetic foot infection ([place])   . Cellulitis of right foot 10/26/2017  . Diabetes mellitus type 1 ([place]) 10/25/2017  . Diabetic gastroparesis ([place]) 10/25/2017  . Hemodialysis patient ([place]) 10/25/2017  . Sepsis ([place]) 10/25/2017  . DKA (diabetic ketoacidoses) ([place]) 05/27/2015  . HTN (hypertension) 08/09/2014  . ESRD (end stage renal disease) on dialysis ([place]) 03/19/2014  . Pain, abdominal, generalized 03/19/2014  . Bacteremia due to coagulase-negative Staphylococcus 05/31/2013  . Leukocytosis 05/31/2013  . Hyponatremia 05/29/2013  . Metabolic acidosis 71/24/5809    Past Surgical History:  Procedure Laterality Date  . AMPUTATION Right 02/02/2018   Procedure: RIGHT FIFTH TOE AND METATARSAL AMPUTATION. Filetted toe flap metatarsal resection. Debridement Plantar Foot wound;  Surgeon: Evelina Bucy, DPM;  Location: [place];  Service: Podiatry;  Laterality: Right;  . AMPUTATION Left 08/20/2018   Procedure: FIFTH METATARSAL BONE BIOPSY;  Surgeon: Evelina Bucy, DPM;  Location: [place];  Service: Podiatry;  Laterality: Left;  . APPLICATION OF WOUND VAC  02/02/2018   Procedure: APPLICATION OF WOUND VAC  Right Foot;  Surgeon: Evelina Bucy, DPM;  Location: [place];  Service: Podiatry;;  . AV FISTULA PLACEMENT     left arm.  . AV FISTULA PLACEMENT Right 12/22/2016   Procedure: INSERTION OF ARTERIOVENOUS (AV) GORE-TEX GRAFT ARM;  Surgeon: Elam Dutch, MD;  Location: [facility] OR;  Service: Vascular;  Laterality: Right;  . AV FISTULA PLACEMENT Left 05/26/2018   Procedure: INSERTION OF  ARTERIOVENOUS (AV) GORE-TEX GRAFT LEFT ARM;  Surgeon: Serafina Mitchell, MD;  Location: [place];  Service: Vascular;  Laterality: Left;  . EYE SURGERY    . I&D  EXTREMITY Right 10/31/2017   Procedure: IRRIGATION AND DEBRIDEMENT RIGHT FOOT;  Surgeon: Evelina Bucy, DPM;  Location: [place];  Service: Podiatry;  Laterality: Right;  . I&D EXTREMITY Left 08/20/2018   Procedure: IRRIGATION AND DEBRIDEMENT EXTREMITY WITH SECONDARY WOUND CLOSUREAND APPLICATION OF WOUND VAC LEFT FOOT;  Surgeon: Evelina Bucy, DPM;  Location: [place];  Service: Podiatry;  Laterality: Left;  . INSERTION OF DIALYSIS CATHETER     Right subclavian  . IR AV DIALY SHUNT INTRO NEEDLE/INTRACATH INITIAL W/PTA/IMG RIGHT Right 02/05/2018  . IR THROMBECTOMY AV FISTULA W/THROMBOLYSIS/PTA INC/SHUNT/IMG LEFT Left 08/24/2018  . IR US GUIDE VASC ACCESS LEFT  08/24/2018  . IR US GUIDE VASC ACCESS RIGHT  02/05/2018  . TRANSMETATARSAL AMPUTATION N/A 08/18/2018   Procedure: IRRIGATION AND DEBRIDEMENT OF LEFT 5TH TOE AND TRANSMETATARSAL, WITH PARTICAL LEFT 5TH TOE AND METATARSAL AMPUTATION, BONE BIOPSY, WOUND VAC APPLICATION.;  Surgeon: Evelina Bucy, DPM;  Location: [place];  Service: Podiatry;  Laterality: N/A;  . UPPER EXTREMITY VENOGRAPHY N/A 11/16/2016   Procedure: Upper Extremity Venography - Right Central;  Surgeon: Elam Dutch, MD;  Location: [place] CV LAB;  Service: Cardiovascular;  Laterality: N/A;  . UPPER EXTREMITY VENOGRAPHY N/A 05/25/2018   Procedure: UPPER EXTREMITY VENOGRAPHY - Bilateral;  Surgeon: Marty Heck, MD;  Location: [place] CV LAB;  Service: Cardiovascular;  Laterality: N/A;        Home Medications    Prior to Admission medications   Medication Sig Start Date End Date Taking? Authorizing Provider  acetaminophen (TYLENOL) 325 MG tablet Take 2 tablets (650 mg total) by mouth every 6 (six) hours as needed for mild pain or headache (pain/headache). Can restart this after you are done with Oxycodone/Tylenol 11/10/17  Yes Domenic Polite, MD  cinacalcet (SENSIPAR) 30 MG tablet Take 30 mg by mouth daily with supper.   Yes [provider]  clonazePAM  (KLONOPIN) 0.5 MG tablet Take 0.5 mg by mouth daily as needed for anxiety (sleep).  07/21/17  Yes [provider]  collagenase (SANTYL) ointment Apply 1 application topically daily. 08/23/18  Yes Elgergawy, Silver Huguenin, MD  insulin aspart (NOVOLOG) 100 UNIT/ML injection Inject 6-7 Units into the skin 2 (two) times daily with a meal.    Yes [provider]  insulin glargine (LANTUS) 100 UNIT/ML injection Inject 0.15 mLs (15 Units total) into the skin at bedtime. 08/23/18  Yes Elgergawy, Silver Huguenin, MD  ondansetron (ZOFRAN ODT) 4 MG disintegrating tablet Take 1 tablet (4 mg total) by mouth every 8 (eight) hours as needed for nausea or vomiting. 11/23/17  Yes Ward, Ozella Almond, PA-C  sevelamer carbonate (RENVELA) 800 MG tablet Take 2,400-3,200 mg by mouth See admin instructions. Take 3 - 4 tablets (2400 mg -3200 mg) by mouth two times daily with meals   Yes [provider]  Amino Acids-Protein Hydrolys (FEEDING SUPPLEMENT, PRO-STAT SUGAR FREE 64,) LIQD Take  30 mLs by mouth 2 (two) times daily. Patient not taking: Reported on 10/20/2018 08/23/18   Elgergawy, Silver Huguenin, MD  collagenase (SANTYL) ointment Apply 1 application topically daily. Patient not taking: Reported on 10/20/2018 07/07/18   Evelina Bucy, DPM  glucose blood ([employer] TEST STRIPS) test strip by Other route 4 (four) times a day as needed. 10/29/16   [provider]  lactobacillus acidophilus & bulgar (LACTINEX) chewable tablet Chew 1 tablet by mouth 3 (three) times daily with meals. Patient not taking: Reported on 10/20/2018 08/23/18   Elgergawy, Silver Huguenin, MD  oxyCODONE-acetaminophen (PERCOCET/ROXICET) 5-325 MG tablet Take 1 tablet by mouth every 6 (six) hours as needed. Patient not taking: Reported on 10/20/2018 08/25/18   Eugenie Filler, MD    Family History Family History  Problem Relation Age of Onset  . Diabetes Mellitus II Other   . Diabetes Father   . Renal Disease Father        ESRD     Social History Social History   Tobacco Use  . Smoking status: Never Smoker  . Smokeless tobacco: Never Used  Substance Use Topics  . Alcohol use: No  . Drug use: No     Allergies   Patient has no known allergies.   Review of Systems Review of Systems  Constitutional: Negative for chills and fever.  HENT: Negative for congestion and sore throat.   Eyes: Negative for visual disturbance.  Respiratory: Negative for cough, chest tightness and shortness of breath.   Cardiovascular: Negative for chest pain, palpitations and leg swelling.  Gastrointestinal: Positive for diarrhea, nausea and vomiting. Negative for abdominal pain.  Genitourinary: Negative for dysuria and flank pain.  Musculoskeletal: Negative for back pain and myalgias.  Skin: Positive for color change and wound. Negative for rash.  Neurological: Negative for dizziness, syncope, light-headedness and headaches.     Physical Exam Updated Vital Signs BP 128/81   Pulse 100   Temp 98.2 F (36.8 C) (Oral)   Resp (!) 23   SpO2 100%   Physical Exam Vitals signs and nursing note reviewed.  Constitutional:      General: He is not in acute distress.    Appearance: He is well-developed.  HENT:     Head: Normocephalic and atraumatic.  Eyes:     Conjunctiva/sclera: Conjunctivae normal.     Pupils: Pupils are equal, round, and reactive to light.  Neck:     Musculoskeletal: Normal range of motion and neck supple.  Cardiovascular:     Rate and Rhythm: Normal rate and regular rhythm.     Heart sounds: S1 normal and S2 normal. No murmur.  Pulmonary:     Effort: Pulmonary effort is normal.     Breath sounds: Normal breath sounds. No wheezing or rales.  Abdominal:     General: Bowel sounds are normal. There is no distension.     Palpations: Abdomen is soft.     Tenderness: There is no abdominal tenderness. There is no guarding.  Musculoskeletal:        General: Swelling present.     Comments: Patient's left  metatarsal exhibits bullae overlying the hallux that is actively draining.  It is erythematous.  There appears to be erythema streaking up the left leg to the level of tibial tuberosity.  Fifth digit of left foot surgically absent.  Lymphadenopathy:     Cervical: No cervical adenopathy.  Skin:    General: Skin is warm and dry.     Findings: No  erythema or rash.  Neurological:     Mental Status: He is alert.     Comments: Cranial nerves grossly intact. Patient moves extremities symmetrically and with good coordination.  Psychiatric:        Behavior: Behavior normal.        Thought Content: Thought content normal.        Judgment: Judgment normal.      ED Treatments / Results  Labs (all labs ordered are listed, but only abnormal results are displayed) Labs Reviewed  COMPREHENSIVE METABOLIC PANEL - Abnormal; Notable for the following components:      Result Value   Sodium 130 (*)    Chloride 89 (*)    Glucose, Bld 210 (*)    BUN 31 (*)    Creatinine, Ser 8.82 (*)    Albumin 2.8 (*)    Alkaline Phosphatase 162 (*)    Total Bilirubin 1.3 (*)    GFR calc non Af Amer 7 (*)    GFR calc Af Amer 8 (*)    Anion gap 16 (*)    All other components within normal limits  CBC - Abnormal; Notable for the following components:   WBC 26.3 (*)    Hemoglobin 12.8 (*)    All other components within normal limits  POCT I-STAT EG7 - Abnormal; Notable for the following components:   Sodium 127 (*)    All other components within normal limits  C DIFFICILE QUICK SCREEN W PCR REFLEX  CULTURE, BLOOD (ROUTINE X 2)  CULTURE, BLOOD (ROUTINE X 2)  GASTROINTESTINAL PANEL BY PCR, STOOL (REPLACES STOOL CULTURE)  LIPASE, BLOOD  LACTIC ACID, PLASMA  URINALYSIS, ROUTINE W REFLEX MICROSCOPIC  LACTIC ACID, PLASMA  BLOOD GAS, VENOUS    EKG EKG Interpretation  Date/Time:  Thursday October 20 2018 12:37:18 EST Ventricular Rate:  89 PR Interval:    QRS Duration: 81 QT Interval:  348 QTC  Calculation: 424 R Axis:   64 Text Interpretation:  Sinus rhythm Low voltage, precordial leads ST elev, probable normal early repol pattern Baseline wander in lead(s) I III aVR aVL aVF V2 V4 V5 When compared to prior, faster rate.  No STEMI Confirmed by Antony Blackbird [phone]) on 10/20/2018 12:43:54 PM Also confirmed by Antony Blackbird [phone]), editor Shon Hale [phone])  on 10/20/2018 1:24:08 PM   Radiology Ct Abdomen Pelvis Wo Contrast  Result Date: 10/20/2018 CLINICAL DATA:  Nausea, vomiting and diarrhea for 4 days. Patient is on dialysis. EXAM: CT ABDOMEN AND PELVIS WITHOUT CONTRAST TECHNIQUE: Multidetector CT imaging of the abdomen and pelvis was performed following the standard protocol without IV contrast. COMPARISON:  CT scan 07/30/2014 FINDINGS: Lower chest: Mild cardiac enlargement but no pericardial effusion. There are advanced three-vessel coronary artery calcifications for age. Hepatobiliary: No focal hepatic lesions or intrahepatic biliary dilatation. The gallbladder is grossly normal. No common bile duct dilatation. Pancreas: No mass, inflammation or ductal dilatation without contrast. Spleen: Normal size.  No focal lesions. Adrenals/Urinary Tract: The adrenal glands are normal. The kidneys demonstrate renal cortical thinning and atrophy. There are extensive renal artery calcifications due to diabetes. No hydronephrosis or renal calculi. The bladder is grossly normal. Stomach/Bowel: The stomach, duodenum, small bowel and colon are grossly normal without oral contrast. No obvious acute inflammatory changes, mass lesions or obstructive findings. The terminal ileum and appendix are normal. Vascular/Lymphatic: Scattered aortic calcifications but no aneurysm. The aortic branch vessels are calcified including the SMA and IMA. Small scattered mesenteric and retroperitoneal lymph nodes but no  mass or overt adenopathy. Reproductive: The prostate gland and seminal vesicles are normal. Extensive  calcifications of the vas deferens are noted. Other: Small bilateral inguinal lymph nodes likely inflammatory/hyperplastic. No pelvic mass or adenopathy. No free pelvic fluid collections. Musculoskeletal: No significant bony findings. IMPRESSION: 1. No acute abdominal/pelvic findings, mass lesions or adenopathy given limitation of no IV contrast or oral contrast. 2. Extensive small vessel disease due to diabetes. 3. Small kidneys with renal cortical atrophy. 4. Fairly extensive, age advanced, three-vessel coronary artery calcifications. Electronically Signed   By: Marijo Sanes M.D.   On: 10/20/2018 14:49   Dg Chest 1 View  Result Date: 10/20/2018 CLINICAL DATA:  Leukocytosis EXAM: CHEST  1 VIEW COMPARISON:  11/23/2017 FINDINGS: No acute opacity or pleural effusion. Stable cardiomediastinal silhouette. No pneumothorax. Upper extremity stents. IMPRESSION: No active disease. Electronically Signed   By: Donavan Foil M.D.   On: 10/20/2018 15:15   Dg Tibia/fibula Left  Result Date: 10/20/2018 CLINICAL DATA:  Leukocytosis.  Neuropathy. EXAM: LEFT TIBIA AND FIBULA - 2 VIEW COMPARISON:  No recent prior. FINDINGS: No acute bony or joint abnormality identified. No evidence of fracture or dislocation. Peripheral vascular calcification. IMPRESSION: 1. No acute bony or joint abnormality. No evidence of fracture dislocation. 2.  Peripheral vascular disease. Electronically Signed   By: Marcello Moores  Register   On: 10/20/2018 15:13   Dg Foot Complete Left  Result Date: 10/20/2018 CLINICAL DATA:  Leukocytosis EXAM: LEFT FOOT - COMPLETE 3+ VIEW COMPARISON:  10/19/2018, 08/20/2018 FINDINGS: Status post resection of the fifth digit with small remnant base of fifth metatarsal. Frayed appearance at the cut surface of the metatarsal with patchy lucencies on the lateral view. Vascular calcifications. No soft tissue emphysema. IMPRESSION: Status post resection of the fifth digit with residual base of fifth metatarsal. As compared  with 08/20/2018, there is indistinct appearance of the cut margin of the metatarsal with heterogeneous lucencies on the lateral view, raising concern for osteomyelitis. Electronically Signed   By: Donavan Foil M.D.   On: 10/20/2018 15:14   Dg Foot Complete Left  Result Date: 10/19/2018 Please see detailed radiograph report in office note.   Procedures Procedures (including critical care time)  CRITICAL CARE Performed by: Albesa Seen   Total critical care time: 35 minutes  Critical care time was exclusive of separately billable procedures and treating other patients.  Critical care was necessary to treat or prevent imminent or life-threatening deterioration.  Critical care was time spent personally by me on the following activities: development of treatment plan with patient and/or surrogate as well as nursing, discussions with consultants, evaluation of patient's response to treatment, examination of patient, obtaining history from patient or surrogate, ordering and performing treatments and interventions, ordering and review of laboratory studies, ordering and review of radiographic studies, pulse oximetry and re-evaluation of patient's condition.   Medications Ordered in ED Medications  sodium chloride flush (NS) 0.9 % injection 3 mL (3 mLs Intravenous Not Given 10/20/18 1112)  metroNIDAZOLE (FLAGYL) IVPB 500 mg (0 mg Intravenous Stopped 10/20/18 1405)  vancomycin (VANCOCIN) IVPB 1000 mg/200 mL premix (has no administration in time range)  ceFEPIme (MAXIPIME) 2 g in sodium chloride 0.9 % 100 mL IVPB (has no administration in time range)  sodium chloride 0.9 % bolus 250 mL (has no administration in time range)  sodium chloride 0.9 % bolus 250 mL (0 mLs Intravenous Stopped 10/20/18 1523)  metoCLOPramide (REGLAN) injection 5 mg (5 mg Intravenous Given 10/20/18 1305)  Initial Impression / Assessment and Plan / ED Course  I have reviewed the triage vital signs and the nursing  notes.  Pertinent labs & imaging results that were available during my care of the patient were reviewed by me and considered in my medical decision making (see chart for details).  Clinical Course as of Oct 21 1615  Thu Oct 20, 2018  1156 Do not suspect DKA. Pt's bicarb and pH are normal.   Anion gap(!): 16 [AM]  1226 WBC(!): 26.3 [AM]  1458 pH, Ven: 7.370 [AM]  7544 Spoke with Dr. Algis Liming of [facility] hospitalist will admit patient.  I appreciate his involvement in the care of this patient.   [AM]    Clinical Course User Index [AM] Albesa Seen, PA-C    Patient overall nontoxic-appearing, afebrile, and hemodynamically stable. Sepsis suspected. Code sepsis called. Patient meeting SIRS criteria based on leukocytosis of 26.3, tachypnea, and HR>90. Pt had fever yesterday; none today. See vitals below. Suspected source of infection intra-abdominal versus osteomyelitis from left foot based on exam. Lactic acid normal.    Vitals:   10/20/18 1115 10/20/18 1245 10/20/18 1300 10/20/18 1412  BP: 128/81     Pulse:      Resp:  13 13 (!) 23  Temp:      TempSrc:      SpO2:         Broad spectrum antibiotics initiated based on suspected source of infection.  No hypotension or elevation in lactic acid.  Patient was given fluid judiciously given his renal function.  Sepsis - Repeat Assessment  Performed at:    3:56 PM  Vitals     Blood pressure 128/81, pulse 100, temperature 98.2 F (36.8 C), temperature source Oral, resp. rate (!) 23, SpO2 100 %.  Heart:     Regular rate and rhythm  Lungs:    CTA  Capillary Refill:   <2 sec  Peripheral Pulse:   Radial pulse palpable  Skin:     Normal Color  4:12 PM Spoke with Dr. March Rummage who will evaluate patient as an inpatient.   Spoke with hospitalist [facility] who has decided to admit patient to a high level of care.    Final Clinical Impressions(s) / ED Diagnoses   Final diagnoses:  Osteomyelitis of left foot, unspecified type ([place])    Nausea and vomiting in adult  Diarrhea, unspecified type  Leukocytosis, unspecified type    ED Discharge Orders    None       Tamala Julian 10/20/18 1618    Tegeler, Gwenyth Allegra, MD 10/20/18 [phone]

## 2018-10-20 NOTE — ED Triage Notes (Signed)
Pt endorses n/v/d x 4 days. Pt is on dialysis and a type 1 diabetic. Pt has wound to left foot from previous amputation and is on antibiotic for possible infection. Last dialysis yesterday.

## 2018-10-20 NOTE — Progress Notes (Signed)
Pt arrived to room 5N24. Received report from Vicente Males, [place] in ED. See assessment. Will continue to monitor.

## 2018-10-20 NOTE — ED Notes (Signed)
Patient transported to CT 

## 2018-10-20 NOTE — Transfer of Care (Signed)
Immediate Anesthesia Transfer of Care Note  Patient: [name]  Procedure(s) Performed: IRRIGATION AND DEBRIDEMENT LEFT FOOT  DEBRIDEMENT LATERAL FOOT WOUND (Left Foot)  Patient Location: PACU  Anesthesia Type:General  Level of Consciousness: awake  Airway & Oxygen Therapy: Patient Spontanous Breathing and Patient connected to face mask oxygen  Post-op Assessment: Report given to RN and Post -op Vital signs reviewed and stable  Post vital signs: Reviewed and stable  Last Vitals:  Vitals Value Taken Time  BP 126/74 10/20/2018  9:15 PM  Temp    Pulse 93 10/20/2018  9:18 PM  Resp 17 10/20/2018  9:18 PM  SpO2 100 % 10/20/2018  9:18 PM  Vitals shown include unvalidated device data.  Last Pain:  Vitals:   10/20/18 1739  TempSrc: Oral  PainSc: 0-No pain         Complications: No apparent anesthesia complications

## 2018-10-20 NOTE — H&P (Addendum)
History and Physical    [name] KGM:[id number] DOB: [birth year] DOA: 10/20/2018  PCP: No primary care provider on file.   I have briefly reviewed patients previous medical reports in [facility].  Patient coming from: Home  Chief Complaint: Intractable nausea, vomiting, diarrhea and swelling of left foot around big toe with drainage, fevers.  HPI: [name] is a 38 year old single male, lives with his mother, independent, extensive PMH: Type I DM with peripheral neuropathy, diabetic gastroparesis, ESRD on MWF HD, multiple lower extremity amputations, essential HTN, presented to the [facility] ED on 10/20/2018 due to above complaints.  Patient reports several years history (6 to 7 years) of chronic nightly fecal incontinence up to 1-2 times per night due to which she has had problems having male relationships.  He was reportedly seen by GI MD at [facility] and was to undergo colonoscopy but states that he could not drink the large volume bowel prep and hence colonoscopy was not performed.  3 days PTA, he started having intractable nausea, nonbloody emesis and profuse diarrhea.  No abdominal pain reported.  He has been unable to keep anything down substantially by mouth.  He reports diarrhea as frequent as every 10 minutes, small-volume, watery without blood or mucus.  Denies recent antibiotic use or sickly contacts with similar symptoms.  Denies eating anything unusual recently.  2 days ago, he noticed a large blister on his left big toe, at HD on 1/22 he was noted to have a fever of 101 F, given antibiotic and advised to go to see his podiatrist.  He was seen by his podiatrist yesterday, small incision made and drained-patient did not see what was drained.  This was bandaged and he was asked to come back as needed or weekly.  This morning patient continued to feel poorly due to his GI symptoms and ongoing drainage from his left foot and thereby came to the ED for further  evaluation.  ED Course: Met sepsis criteria on admission including tachypnea, leukocytosis and tachycardia.  Treated per sepsis protocol but with gentle IV fluids given dialysis situation, empirically started on IV antibiotics.  EDP discussed with patient's primary podiatrist Dr. March Rummage who advised to keep patient n.p.o. and plans to take to the OR for debridement of left foot.  Lab work significant for WBC 26.3, sodium 130, glucose 210, BUN 31, creatinine 8.82, CT abdomen and pelvis without contrast without acute findings, chest x-ray without active disease x-rays of the left tibia/fibula without acute findings, x-ray of left foot concerning for osteomyelitis of left fifth metatarsal.  Review of Systems:  All other systems reviewed and apart from HPI, are negative.  Past Medical History:  Diagnosis Date  . Diabetes mellitus without complication ([place])   . Diabetic gastroparesis ([place])   . Dialysis patient ([place])   . Hypertension   . Renal disorder    Dialysis  . Sepsis [facility])     Past Surgical History:  Procedure Laterality Date  . AMPUTATION Right 02/02/2018   Procedure: RIGHT FIFTH TOE AND METATARSAL AMPUTATION. Filetted toe flap metatarsal resection. Debridement Plantar Foot wound;  Surgeon: Evelina Bucy, DPM;  Location: [place];  Service: Podiatry;  Laterality: Right;  . AMPUTATION Left 08/20/2018   Procedure: FIFTH METATARSAL BONE BIOPSY;  Surgeon: Evelina Bucy, DPM;  Location: [place];  Service: Podiatry;  Laterality: Left;  . APPLICATION OF WOUND VAC  02/02/2018   Procedure: APPLICATION OF WOUND VAC  Right Foot;  Surgeon: March Rummage,  Christian Mate, DPM;  Location: [facility] OR;  Service: Podiatry;;  . AV FISTULA PLACEMENT     left arm.  . AV FISTULA PLACEMENT Right 12/22/2016   Procedure: INSERTION OF ARTERIOVENOUS (AV) GORE-TEX GRAFT ARM;  Surgeon: Elam Dutch, MD;  Location: [facility] OR;  Service: Vascular;  Laterality: Right;  . AV FISTULA PLACEMENT Left 05/26/2018   Procedure: INSERTION OF   ARTERIOVENOUS (AV) GORE-TEX GRAFT LEFT ARM;  Surgeon: Serafina Mitchell, MD;  Location: [place];  Service: Vascular;  Laterality: Left;  . EYE SURGERY    . I&D EXTREMITY Right 10/31/2017   Procedure: IRRIGATION AND DEBRIDEMENT RIGHT FOOT;  Surgeon: Evelina Bucy, DPM;  Location: [place];  Service: Podiatry;  Laterality: Right;  . I&D EXTREMITY Left 08/20/2018   Procedure: IRRIGATION AND DEBRIDEMENT EXTREMITY WITH SECONDARY WOUND CLOSUREAND APPLICATION OF WOUND VAC LEFT FOOT;  Surgeon: Evelina Bucy, DPM;  Location: [place];  Service: Podiatry;  Laterality: Left;  . INSERTION OF DIALYSIS CATHETER     Right subclavian  . IR AV DIALY SHUNT INTRO NEEDLE/INTRACATH INITIAL W/PTA/IMG RIGHT Right 02/05/2018  . IR THROMBECTOMY AV FISTULA W/THROMBOLYSIS/PTA INC/SHUNT/IMG LEFT Left 08/24/2018  . IR US GUIDE VASC ACCESS LEFT  08/24/2018  . IR US GUIDE VASC ACCESS RIGHT  02/05/2018  . TRANSMETATARSAL AMPUTATION N/A 08/18/2018   Procedure: IRRIGATION AND DEBRIDEMENT OF LEFT 5TH TOE AND TRANSMETATARSAL, WITH PARTICAL LEFT 5TH TOE AND METATARSAL AMPUTATION, BONE BIOPSY, WOUND VAC APPLICATION.;  Surgeon: Evelina Bucy, DPM;  Location: [place];  Service: Podiatry;  Laterality: N/A;  . UPPER EXTREMITY VENOGRAPHY N/A 11/16/2016   Procedure: Upper Extremity Venography - Right Central;  Surgeon: Elam Dutch, MD;  Location: [place] CV LAB;  Service: Cardiovascular;  Laterality: N/A;  . UPPER EXTREMITY VENOGRAPHY N/A 05/25/2018   Procedure: UPPER EXTREMITY VENOGRAPHY - Bilateral;  Surgeon: Marty Heck, MD;  Location: [place] CV LAB;  Service: Cardiovascular;  Laterality: N/A;    Social History  reports that he has never smoked. He has never used smokeless tobacco. He reports that he does not drink alcohol or use drugs.  No Known Allergies  Family History  Problem Relation Age of Onset  . Diabetes Mellitus II Other   . Diabetes Father   . Renal Disease Father        ESRD     Prior to  Admission medications   Medication Sig Start Date End Date Taking? Authorizing Provider  acetaminophen (TYLENOL) 325 MG tablet Take 2 tablets (650 mg total) by mouth every 6 (six) hours as needed for mild pain or headache (pain/headache). Can restart this after you are done with Oxycodone/Tylenol 11/10/17  Yes Domenic Polite, MD  cinacalcet (SENSIPAR) 30 MG tablet Take 30 mg by mouth daily with supper.   Yes [provider]  clonazePAM (KLONOPIN) 0.5 MG tablet Take 0.5 mg by mouth daily as needed for anxiety (sleep).  07/21/17  Yes [provider]  collagenase (SANTYL) ointment Apply 1 application topically daily. 08/23/18  Yes Elgergawy, Silver Huguenin, MD  insulin aspart (NOVOLOG) 100 UNIT/ML injection Inject 6-7 Units into the skin 2 (two) times daily with a meal.    Yes [provider]  insulin glargine (LANTUS) 100 UNIT/ML injection Inject 0.15 mLs (15 Units total) into the skin at bedtime. 08/23/18  Yes Elgergawy, Silver Huguenin, MD  ondansetron (ZOFRAN ODT) 4 MG disintegrating tablet Take 1 tablet (4 mg total) by mouth every 8 (eight) hours as needed for nausea or  vomiting. 11/23/17  Yes Ward, Ozella Almond, PA-C  sevelamer carbonate (RENVELA) 800 MG tablet Take 2,400-3,200 mg by mouth See admin instructions. Take 3 - 4 tablets (2400 mg -3200 mg) by mouth two times daily with meals   Yes [provider]  glucose blood ([employer] TEST STRIPS) test strip by Other route 4 (four) times a day as needed. 10/29/16   [provider]    Physical Exam: Vitals:   10/20/18 1608 10/20/18 1609 10/20/18 1615 10/20/18 1630  BP:   (!) 106/57 128/78  Pulse: 94 92 (!) 107   Resp: 15 18 (!) 27 (!) 25  Temp:      TempSrc:      SpO2: 100% 99% 100%       Constitutional: Pleasant young male, moderately built and nourished, lying comfortably propped up in bed.  As per ED staff, has been ambulating comfortably in the room without assistance. Eyes: PERTLA, lids and conjunctivae  normal ENMT: Mucous membranes are dry. Posterior pharynx clear of any exudate or lesions. Normal dentition.  Neck: supple, no masses, no thyromegaly Respiratory: clear to auscultation bilaterally, no wheezing, no crackles. Normal respiratory effort. No accessory muscle use.  Cardiovascular: S1 & S2 heard, regular rate and rhythm, no murmurs / rubs / gallops. No extremity edema. No carotid bruits.  Abdomen: No distension, no tenderness, no masses palpated. No hepatosplenomegaly. Bowel sounds normal.  Musculoskeletal: no clubbing / cyanosis. No joint deformity upper and lower extremities. Good ROM, no contractures. Normal muscle tone.  Skin: Right fifth toe amputation site almost healed and no acute findings.  Diffuse swelling around left big toe and medial forefoot, appears like a blister, darkish in color, draining from a small incision on the medial aspect, drainage is bloody and may be slightly purulent but not foul-smelling.  Bilateral dorsalis pedis feeble but appreciated bilaterally.  Incision over the lateral aspect of left foot from fifth toe amputation has almost healed.  Please see picture below for details from 1/23. Neurologic: CN 2-12 grossly intact. Sensation intact, DTR normal. Strength 5/5 in all 4 limbs.  Left upper extremity AV fistula for HD. Psychiatric: Normal judgment and insight. Alert and oriented x 3. Normal mood.           Labs on Admission: I have personally reviewed following labs and imaging studies  CBC: Recent Labs  Lab 10/20/18 1013 10/20/18 1350  WBC 26.3*  --   HGB 12.8* 16.3  HCT 41.1 48.0  MCV 95.1  --   PLT 166  --    Basic Metabolic Panel: Recent Labs  Lab 10/20/18 1013 10/20/18 1350  NA 130* 127*  K 4.3 4.5  CL 89*  --   CO2 25  --   GLUCOSE 210*  --   BUN 31*  --   CREATININE 8.82*  --   CALCIUM 9.4  --    Liver Function Tests: Recent Labs  Lab 10/20/18 1013  AST 23  ALT 14  ALKPHOS 162*  BILITOT 1.3*  PROT 7.5  ALBUMIN  2.8*     Radiological Exams on Admission: Ct Abdomen Pelvis Wo Contrast  Result Date: 10/20/2018 CLINICAL DATA:  Nausea, vomiting and diarrhea for 4 days. Patient is on dialysis. EXAM: CT ABDOMEN AND PELVIS WITHOUT CONTRAST TECHNIQUE: Multidetector CT imaging of the abdomen and pelvis was performed following the standard protocol without IV contrast. COMPARISON:  CT scan 07/30/2014 FINDINGS: Lower chest: Mild cardiac enlargement but no pericardial effusion. There are advanced three-vessel coronary artery calcifications  for age. Hepatobiliary: No focal hepatic lesions or intrahepatic biliary dilatation. The gallbladder is grossly normal. No common bile duct dilatation. Pancreas: No mass, inflammation or ductal dilatation without contrast. Spleen: Normal size.  No focal lesions. Adrenals/Urinary Tract: The adrenal glands are normal. The kidneys demonstrate renal cortical thinning and atrophy. There are extensive renal artery calcifications due to diabetes. No hydronephrosis or renal calculi. The bladder is grossly normal. Stomach/Bowel: The stomach, duodenum, small bowel and colon are grossly normal without oral contrast. No obvious acute inflammatory changes, mass lesions or obstructive findings. The terminal ileum and appendix are normal. Vascular/Lymphatic: Scattered aortic calcifications but no aneurysm. The aortic branch vessels are calcified including the SMA and IMA. Small scattered mesenteric and retroperitoneal lymph nodes but no mass or overt adenopathy. Reproductive: The prostate gland and seminal vesicles are normal. Extensive calcifications of the vas deferens are noted. Other: Small bilateral inguinal lymph nodes likely inflammatory/hyperplastic. No pelvic mass or adenopathy. No free pelvic fluid collections. Musculoskeletal: No significant bony findings. IMPRESSION: 1. No acute abdominal/pelvic findings, mass lesions or adenopathy given limitation of no IV contrast or oral contrast. 2. Extensive  small vessel disease due to diabetes. 3. Small kidneys with renal cortical atrophy. 4. Fairly extensive, age advanced, three-vessel coronary artery calcifications. Electronically Signed   By: Marijo Sanes M.D.   On: 10/20/2018 14:49   Dg Chest 1 View  Result Date: 10/20/2018 CLINICAL DATA:  Leukocytosis EXAM: CHEST  1 VIEW COMPARISON:  11/23/2017 FINDINGS: No acute opacity or pleural effusion. Stable cardiomediastinal silhouette. No pneumothorax. Upper extremity stents. IMPRESSION: No active disease. Electronically Signed   By: Donavan Foil M.D.   On: 10/20/2018 15:15   Dg Tibia/fibula Left  Result Date: 10/20/2018 CLINICAL DATA:  Leukocytosis.  Neuropathy. EXAM: LEFT TIBIA AND FIBULA - 2 VIEW COMPARISON:  No recent prior. FINDINGS: No acute bony or joint abnormality identified. No evidence of fracture or dislocation. Peripheral vascular calcification. IMPRESSION: 1. No acute bony or joint abnormality. No evidence of fracture dislocation. 2.  Peripheral vascular disease. Electronically Signed   By: Marcello Moores  Register   On: 10/20/2018 15:13   Dg Foot Complete Left  Result Date: 10/20/2018 CLINICAL DATA:  Leukocytosis EXAM: LEFT FOOT - COMPLETE 3+ VIEW COMPARISON:  10/19/2018, 08/20/2018 FINDINGS: Status post resection of the fifth digit with small remnant base of fifth metatarsal. Frayed appearance at the cut surface of the metatarsal with patchy lucencies on the lateral view. Vascular calcifications. No soft tissue emphysema. IMPRESSION: Status post resection of the fifth digit with residual base of fifth metatarsal. As compared with 08/20/2018, there is indistinct appearance of the cut margin of the metatarsal with heterogeneous lucencies on the lateral view, raising concern for osteomyelitis. Electronically Signed   By: Donavan Foil M.D.   On: 10/20/2018 15:14   Dg Foot Complete Left  Result Date: 10/19/2018 Please see detailed radiograph report in office note.   EKG: Independently reviewed.   Sinus rhythm at 89 bpm, normal axis, Q waves in leads V1-2 and no acute findings.  QTc 424 ms.  Assessment/Plan Principal Problem:   Diabetic foot infection ([place]) Active Problems:   Diabetes mellitus type 1 ([place])   Diabetic gastroparesis ([place])   ESRD (end stage renal disease) on dialysis ([place])   HTN (hypertension)   Sepsis ([name])   Cellulitis and abscess of foot   Nausea vomiting and diarrhea     1. Intractable nausea, vomiting and diarrhea: DD: Acute viral GE, diabetic gastroparesis flare, related to sepsis from diabetic  foot infection versus other etiologies.  C. difficile testing negative.  GI panel PCR pending.  Abdominal CT without contrast without acute findings.  Treat supportively with bowel rest /NPO (supposed to have surgical intervention tonight), gentle IV fluids, IV Pepcid, PRN antiemetics.  Postop and pending clinical improvement, advance diet gradually.  Outpatient follow-up with GI regarding chronic fecal incontinence. 2. Sepsis due to left diabetic foot infection: Concern for abscess/cellulitis around left great toe and medial forefoot.  Not sure if patient has osteomyelitis of left fifth metatarsal.  Met sepsis criteria on admission.  Received gentle IV fluids.  Continue empirically started IV vancomycin, cefepime and metronidazole.  Follow outstanding cultures. 3. Left diabetic foot infection: EDP has discussed with podiatry/Dr. March Rummage who plans to evaluate patient tonight and consider OR for I&D. 4. Uncontrolled type I DM with peripheral neuropathy and renal complications: Placed on reduced dose of Lantus 10 units at bedtime and sensitive SSI.  Monitor closely and adjust insulins as needed.  Clinically does not appear to be in DKA.  Elevated anion gap likely related to ESRD. 5. ESRD on MWF HD: Last dialyzed on 1/22.  I have consulted nephrology for dialysis needs tomorrow.  No emergent needs for HD. 6. Essential hypertension: Not on antihypertensives at home.   Controlled. 7. Leukocytosis: Secondary to sepsis.  Follow daily CBCs   DVT prophylaxis: Subcutaneous hep Code Status: Full Family Communication: None at bedside Disposition Plan: DC home pending clinical improvement Consults called: Podiatry/Dr. March Rummage Admission status: Inpatient, medical bed.  Severity of Illness: The appropriate patient status for this patient is INPATIENT. Inpatient status is judged to be reasonable and necessary in order to provide the required intensity of service to ensure the patient's safety. The patient's presenting symptoms, physical exam findings, and initial radiographic and laboratory data in the context of their chronic comorbidities is felt to place them at high risk for further clinical deterioration. Furthermore, it is not anticipated that the patient will be medically stable for discharge from the hospital within 2 midnights of admission. The following factors support the patient status of inpatient.   " The patient's presenting symptoms include intractable nausea, vomiting, diarrhea, swelling and drainage from left foot and fever. " The worrisome physical exam findings include swelling and drainage from left great toe/forefoot concerning for cellulitis/abscess. " The initial radiographic and laboratory data are worrisome because of leukocytosis, elevated creatinine from ESRD, elevated glucose. " The chronic co-morbidities include DM 1 with peripheral neuropathy, ESRD on HD.   * I certify that at the point of admission it is my clinical judgment that the patient will require inpatient hospital care spanning beyond 2 midnights from the point of admission due to high intensity of service, high risk for further deterioration and high frequency of surveillance required.Vernell Leep MD [facility]  To contact the attending provider between 7A-7P or the covering provider during after hours 7P-7A, please log into the web site [email or web address] and  access using universal Hall password for that web site. If you do not have the password, please call the hospital operator.  10/20/2018, 5:33 PM

## 2018-10-20 NOTE — Progress Notes (Signed)
Attempted ABG in right radial unsucesfully.PT refused to be stuck again. PA notified.

## 2018-10-20 NOTE — Anesthesia Procedure Notes (Signed)
Procedure Name: Intubation Date/Time: 10/20/2018 8:12 PM Performed by: Suzy Bouchard, CRNA Pre-anesthesia Checklist: Patient identified, Emergency Drugs available, Suction available, Patient being monitored and Timeout performed Patient Re-evaluated:Patient Re-evaluated prior to induction Oxygen Delivery Method: Circle system utilized Preoxygenation: Pre-oxygenation with 100% oxygen Induction Type: IV induction, Rapid sequence and Cricoid Pressure applied Laryngoscope Size: Miller and 2 Grade View: Grade I Tube type: Oral Tube size: 7.5 mm Number of attempts: 1 Airway Equipment and Method: Stylet Placement Confirmation: ETT inserted through vocal cords under direct vision,  breath sounds checked- equal and bilateral and positive ETCO2 Secured at: 23 cm Tube secured with: Tape Dental Injury: Teeth and Oropharynx as per pre-operative assessment

## 2018-10-20 NOTE — Telephone Encounter (Signed)
Spoke with [name] earlier will see patient as consult had recommended admission

## 2018-10-20 NOTE — ED Notes (Signed)
Pt called out to use bed side commode. Tech assisted pt to beside commode and showed pt where the proper equipment is for when he needed to use beside commode.Pt started yelling at tech saying, "you act like we are at [facility]. I didn't know where anything was." Tech and nurses have been in pts room MULTIPLE times to clean pt and to help pt to beside commode. Pt is has steady gait and has no problems ambulating to commode by himself. Notified Nurse of situation.

## 2018-10-20 NOTE — Consult Note (Addendum)
Reason for Consult: Left foot wound / osteomyelitis Referring Physician: Langston Masker, PA  [name] is an 38 y.o. male.   HPI: 38 year old male known to author, presented to the ED with left foot swelling edema redness.  2 to 3-day history of nausea vomiting been unable to keep anything down with diarrhea.  Reports recent fever has had swelling of the left foot unsure of cause.  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  As last visit 10/13/2018 his foot was healing well and he had no signs of acute infection.  Did not have anything to eat or drink today since being in the ER.  Past Medical History:  Diagnosis Date  . Diabetes mellitus without complication ([place])   . Diabetic gastroparesis ([place])   . Dialysis patient ([place])   . Hypertension   . Renal disorder    Dialysis  . Sepsis [facility])     Past Surgical History:  Procedure Laterality Date  . AMPUTATION Right 02/02/2018   Procedure: RIGHT FIFTH TOE AND METATARSAL AMPUTATION. Filetted toe flap metatarsal resection. Debridement Plantar Foot wound;  Surgeon: Evelina Bucy, DPM;  Location: [place];  Service: Podiatry;  Laterality: Right;  . AMPUTATION Left 08/20/2018   Procedure: FIFTH METATARSAL BONE BIOPSY;  Surgeon: Evelina Bucy, DPM;  Location: [place];  Service: Podiatry;  Laterality: Left;  . APPLICATION OF WOUND VAC  02/02/2018   Procedure: APPLICATION OF WOUND VAC  Right Foot;  Surgeon: Evelina Bucy, DPM;  Location: [place];  Service: Podiatry;;  . AV FISTULA PLACEMENT     left arm.  . AV FISTULA PLACEMENT Right 12/22/2016   Procedure: INSERTION OF ARTERIOVENOUS (AV) GORE-TEX GRAFT ARM;  Surgeon: Elam Dutch, MD;  Location: [facility];  Service: Vascular;  Laterality: Right;  . AV FISTULA PLACEMENT Left 05/26/2018   Procedure: INSERTION OF  ARTERIOVENOUS (AV) GORE-TEX GRAFT LEFT ARM;  Surgeon: Serafina Mitchell, MD;  Location: [place];  Service: Vascular;  Laterality: Left;  . EYE SURGERY    . I&D EXTREMITY Right 10/31/2017   Procedure:  IRRIGATION AND DEBRIDEMENT RIGHT FOOT;  Surgeon: Evelina Bucy, DPM;  Location: [place];  Service: Podiatry;  Laterality: Right;  . I&D EXTREMITY Left 08/20/2018   Procedure: IRRIGATION AND DEBRIDEMENT EXTREMITY WITH SECONDARY WOUND CLOSUREAND APPLICATION OF WOUND VAC LEFT FOOT;  Surgeon: Evelina Bucy, DPM;  Location: [place];  Service: Podiatry;  Laterality: Left;  . INSERTION OF DIALYSIS CATHETER     Right subclavian  . IR AV DIALY SHUNT INTRO NEEDLE/INTRACATH INITIAL W/PTA/IMG RIGHT Right 02/05/2018  . IR THROMBECTOMY AV FISTULA W/THROMBOLYSIS/PTA INC/SHUNT/IMG LEFT Left 08/24/2018  . IR US GUIDE VASC ACCESS LEFT  08/24/2018  . IR US GUIDE VASC ACCESS RIGHT  02/05/2018  . TRANSMETATARSAL AMPUTATION N/A 08/18/2018   Procedure: IRRIGATION AND DEBRIDEMENT OF LEFT 5TH TOE AND TRANSMETATARSAL, WITH PARTICAL LEFT 5TH TOE AND METATARSAL AMPUTATION, BONE BIOPSY, WOUND VAC APPLICATION.;  Surgeon: Evelina Bucy, DPM;  Location: [place];  Service: Podiatry;  Laterality: N/A;  . UPPER EXTREMITY VENOGRAPHY N/A 11/16/2016   Procedure: Upper Extremity Venography - Right Central;  Surgeon: Elam Dutch, MD;  Location: [place] [facility];  Service: Cardiovascular;  Laterality: N/A;  . UPPER EXTREMITY VENOGRAPHY N/A 05/25/2018   Procedure: UPPER EXTREMITY VENOGRAPHY - Bilateral;  Surgeon: Marty Heck, MD;  Location: [place] [facility];  Service: Cardiovascular;  Laterality: N/A;    Family History  Problem Relation Age of Onset  .  Diabetes Mellitus II Other   . Diabetes Father   . Renal Disease Father        ESRD    Social History:  reports that he has never smoked. He has never used smokeless tobacco. He reports that he does not drink alcohol or use drugs.  Allergies: No Known Allergies  Medications: I have reviewed the patient's current medications.  Results for orders placed or performed during the hospital encounter of 10/20/18 (from the past 48 hour(s))  Lipase, blood     Status:  None   Collection Time: 10/20/18 10:13 AM  Result Value Ref Range   Lipase 28 11 - 51 U/L    Comment: Performed at [facility], [place] [address], [place], [place] [address]  Comprehensive metabolic panel     Status: Abnormal   Collection Time: 10/20/18 10:13 AM  Result Value Ref Range   Sodium 130 (L) 135 - 145 mmol/L   Potassium 4.3 3.5 - 5.1 mmol/L   Chloride 89 (L) 98 - 111 mmol/L   CO2 25 22 - 32 mmol/L   Glucose, Bld 210 (H) 70 - 99 mg/dL   BUN 31 (H) 6 - 20 mg/dL   Creatinine, Ser 8.82 (H) 0.61 - 1.24 mg/dL   Calcium 9.4 8.9 - 10.3 mg/dL   Total Protein 7.5 6.5 - 8.1 g/dL   Albumin 2.8 (L) 3.5 - 5.0 g/dL   AST 23 15 - 41 U/L   ALT 14 0 - 44 U/L   Alkaline Phosphatase 162 (H) 38 - 126 U/L   Total Bilirubin 1.3 (H) 0.3 - 1.2 mg/dL   GFR calc non Af Amer 7 (L) >60 mL/min   GFR calc Af Amer 8 (L) >60 mL/min   Anion gap 16 (H) 5 - 15    Comment: Performed at [facility], [place] [address] [place], [place] [address]  CBC     Status: Abnormal   Collection Time: 10/20/18 10:13 AM  Result Value Ref Range   WBC 26.3 (H) 4.0 - 10.5 K/uL   RBC 4.32 4.22 - 5.81 MIL/uL   Hemoglobin 12.8 (L) 13.0 - 17.0 g/dL   HCT 41.1 39.0 - 52.0 %   MCV 95.1 80.0 - 100.0 fL   MCH 29.6 26.0 - 34.0 pg   MCHC 31.1 30.0 - 36.0 g/dL   RDW 14.6 11.5 - 15.5 %   Platelets 166 150 - 400 K/uL   nRBC 0.0 0.0 - 0.2 %    Comment: Performed at [facility], [place] [address], [place], [place] [address]  C difficile quick scan w PCR reflex     Status: None   Collection Time: 10/20/18 12:26 PM  Result Value Ref Range   C Diff antigen NEGATIVE NEGATIVE   C Diff toxin NEGATIVE NEGATIVE   C Diff interpretation No C. difficile detected.     Comment: Performed at [facility], [place] [address] [place], [place] [address]  Lactic acid, plasma     Status: None   Collection Time: 10/20/18 12:42 PM  Result Value Ref Range   Lactic Acid, Venous 1.2 0.5 - 1.9 mmol/L    Comment: Performed at [facility] [address], [place], [place] [address]  POCT I-Stat EG7     Status: Abnormal   Collection Time: 10/20/18  1:50 PM  Result Value Ref Range   pH, Ven 7.370 7.250 - 7.430   pCO2, Ven 45.7 44.0 - 60.0  mmHg   pO2, Ven 33.0 32.0 - 45.0 mmHg   Bicarbonate 26.4 20.0 - 28.0 mmol/L   TCO2 28 22 - 32 mmol/L   O2 Saturation 60.0 %   Acid-Base Excess 1.0 0.0 - 2.0 mmol/L   Sodium 127 (L) 135 - 145 mmol/L   Potassium 4.5 3.5 - 5.1 mmol/L   Calcium, Ion 1.20 1.15 - 1.40 mmol/L   HCT 48.0 39.0 - 52.0 %   Hemoglobin 16.3 13.0 - 17.0 g/dL   Patient temperature HIDE    Sample type VENOUS    Comment NOTIFIED PHYSICIAN   Glucose, capillary     Status: Abnormal   Collection Time: 10/20/18  5:37 PM  Result Value Ref Range   Glucose-Capillary 322 (H) 70 - 99 mg/dL    Ct Abdomen Pelvis Wo Contrast  Result Date: 10/20/2018 CLINICAL DATA:  Nausea, vomiting and diarrhea for 4 days. Patient is on dialysis. EXAM: CT ABDOMEN AND PELVIS WITHOUT CONTRAST TECHNIQUE: Multidetector CT imaging of the abdomen and pelvis was performed following the standard protocol without IV contrast. COMPARISON:  CT scan 07/30/2014 FINDINGS: Lower chest: Mild cardiac enlargement but no pericardial effusion. There are advanced three-vessel coronary artery calcifications for age. Hepatobiliary: No focal hepatic lesions or intrahepatic biliary dilatation. The gallbladder is grossly normal. No common bile duct dilatation. Pancreas: No mass, inflammation or ductal dilatation without contrast. Spleen: Normal size.  No focal lesions. Adrenals/Urinary Tract: The adrenal glands are normal. The kidneys demonstrate renal cortical thinning and atrophy. There are extensive renal artery calcifications due to diabetes. No hydronephrosis or renal calculi. The bladder is grossly normal. Stomach/Bowel: The stomach, duodenum, small bowel and colon are grossly normal without oral contrast. No obvious acute inflammatory changes, mass lesions or  obstructive findings. The terminal ileum and appendix are normal. Vascular/Lymphatic: Scattered aortic calcifications but no aneurysm. The aortic branch vessels are calcified including the SMA and IMA. Small scattered mesenteric and retroperitoneal lymph nodes but no mass or overt adenopathy. Reproductive: The prostate gland and seminal vesicles are normal. Extensive calcifications of the vas deferens are noted. Other: Small bilateral inguinal lymph nodes likely inflammatory/hyperplastic. No pelvic mass or adenopathy. No free pelvic fluid collections. Musculoskeletal: No significant bony findings. IMPRESSION: 1. No acute abdominal/pelvic findings, mass lesions or adenopathy given limitation of no IV contrast or oral contrast. 2. Extensive small vessel disease due to diabetes. 3. Small kidneys with renal cortical atrophy. 4. Fairly extensive, age advanced, three-vessel coronary artery calcifications. Electronically Signed   By: Marijo Sanes M.D.   On: 10/20/2018 14:49   Dg Chest 1 View  Result Date: 10/20/2018 CLINICAL DATA:  Leukocytosis EXAM: CHEST  1 VIEW COMPARISON:  11/23/2017 FINDINGS: No acute opacity or pleural effusion. Stable cardiomediastinal silhouette. No pneumothorax. Upper extremity stents. IMPRESSION: No active disease. Electronically Signed   By: Donavan Foil M.D.   On: 10/20/2018 15:15   Dg Tibia/fibula Left  Result Date: 10/20/2018 CLINICAL DATA:  Leukocytosis.  Neuropathy. EXAM: LEFT TIBIA AND FIBULA - 2 VIEW COMPARISON:  No recent prior. FINDINGS: No acute bony or joint abnormality identified. No evidence of fracture or dislocation. Peripheral vascular calcification. IMPRESSION: 1. No acute bony or joint abnormality. No evidence of fracture dislocation. 2.  Peripheral vascular disease. Electronically Signed   By: Marcello [place]  Register   On: 10/20/2018 15:13   Dg Foot Complete Left  Result Date: 10/20/2018 CLINICAL DATA:  Leukocytosis EXAM: LEFT FOOT - COMPLETE 3+ VIEW COMPARISON:   10/19/2018, 08/20/2018 FINDINGS: Status post resection of the fifth  digit with small remnant base of fifth metatarsal. Frayed appearance at the cut surface of the metatarsal with patchy lucencies on the lateral view. Vascular calcifications. No soft tissue emphysema. IMPRESSION: Status post resection of the fifth digit with residual base of fifth metatarsal. As compared with 08/20/2018, there is indistinct appearance of the cut margin of the metatarsal with heterogeneous lucencies on the lateral view, raising concern for osteomyelitis. Electronically Signed   By: Donavan Foil M.D.   On: 10/20/2018 15:14   Dg Foot Complete Left  Result Date: 10/19/2018 Please see detailed radiograph report in office note.   ROS Blood pressure (!) 142/97, pulse 90, temperature 98.5 F (36.9 C), temperature source Oral, resp. rate 17, SpO2 100 %. Physical Exam L foot warm Lateral wound / incision with eschar, no evident opening, no associated warmth, drainage, erythema. Left hallux epidermolysis, edema, drainage. Warmth, erythema, ascending cellulitis to the ankle. Streaking. Possible open ulceration sub 1st MPJ   Assessment/Plan:  Left foot cellulitis and abscess, possible septic joint -Labs and vitals reviewed. WBC 26.3. -XR reviewed, ?cortical irregularity 5th metatarsal unlikely clinical cause of patient's current condition. Likely chronic changes, was treated with abx. 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 -Continue empiric IV abx -Continue NPO -OR tonight for left foot incision and drainage, possible arthrotomy for septic joint, debridement of all wounds left foot. -Consent reviewed and signed. -Left foot marked. -Will obtain cultures intra-op. -Will continue to follow post-op.  Total floor time: 55 minutes: >50% spent on counseling and coordination of care.  Evelina Bucy 10/20/2018, 7:41 PM

## 2018-10-20 NOTE — Telephone Encounter (Signed)
[facility] states [place], [place] [phone] needs a call within the hour concerning pt and the left foot osteomyelitis. I gave Dr. March Rummage a handwritten note with Valere Dross, PA's contact information.

## 2018-10-21 ENCOUNTER — Encounter (HOSPITAL_COMMUNITY): Payer: Self-pay | Admitting: Podiatry

## 2018-10-21 DIAGNOSIS — D72829 Elevated white blood cell count, unspecified: Secondary | ICD-10-CM

## 2018-10-21 DIAGNOSIS — K3184 Gastroparesis: Secondary | ICD-10-CM

## 2018-10-21 DIAGNOSIS — E1143 Type 2 diabetes mellitus with diabetic autonomic (poly)neuropathy: Secondary | ICD-10-CM

## 2018-10-21 LAB — CBC
HCT: 36.1 % — ABNORMAL LOW (ref 39.0–52.0)
Hemoglobin: 11.5 g/dL — ABNORMAL LOW (ref 13.0–17.0)
MCH: 29.8 pg (ref 26.0–34.0)
MCHC: 31.9 g/dL (ref 30.0–36.0)
MCV: 93.5 fL (ref 80.0–100.0)
Platelets: 151 10*3/uL (ref 150–400)
RBC: 3.86 MIL/uL — ABNORMAL LOW (ref 4.22–5.81)
RDW: 14.7 % (ref 11.5–15.5)
WBC: 22.7 10*3/uL — ABNORMAL HIGH (ref 4.0–10.5)
nRBC: 0 % (ref 0.0–0.2)

## 2018-10-21 LAB — GLUCOSE, CAPILLARY
Glucose-Capillary: 136 mg/dL — ABNORMAL HIGH (ref 70–99)
Glucose-Capillary: 354 mg/dL — ABNORMAL HIGH (ref 70–99)
Glucose-Capillary: 206 mg/dL — ABNORMAL HIGH (ref 70–99)
Glucose-Capillary: 282 mg/dL — ABNORMAL HIGH (ref 70–99)

## 2018-10-21 LAB — GASTROINTESTINAL PANEL BY PCR, STOOL (REPLACES STOOL CULTURE)

## 2018-10-21 MED ORDER — INSULIN GLARGINE 100 UNIT/ML ~~LOC~~ SOLN
15.0000 [IU] | Freq: Every day | SUBCUTANEOUS | Status: DC
  Administered 2018-10-21 – 2018-10-24 (×3): 15 [IU] via SUBCUTANEOUS
  Filled 2018-10-21 (×5): qty 0.15

## 2018-10-21 MED ORDER — CINACALCET HCL 30 MG PO TABS
30.0000 mg | ORAL_TABLET | Freq: Every day | ORAL | Status: DC
  Administered 2018-10-21 – 2018-10-24 (×3): 30 mg via ORAL
  Filled 2018-10-21 (×4): qty 1

## 2018-10-21 MED ORDER — INSULIN ASPART 100 UNIT/ML ~~LOC~~ SOLN
0.0000 [IU] | Freq: Every day | SUBCUTANEOUS | Status: DC
  Administered 2018-10-21: 3 [IU] via SUBCUTANEOUS

## 2018-10-21 MED ORDER — SEVELAMER CARBONATE 800 MG PO TABS
2400.0000 mg | ORAL_TABLET | Freq: Two times a day (BID) | ORAL | Status: DC
  Administered 2018-10-21 – 2018-10-22 (×4): 2400 mg via ORAL
  Filled 2018-10-21 (×4): qty 3

## 2018-10-21 MED ORDER — INSULIN ASPART 100 UNIT/ML ~~LOC~~ SOLN
0.0000 [IU] | Freq: Three times a day (TID) | SUBCUTANEOUS | Status: DC
  Administered 2018-10-21: 9 [IU] via SUBCUTANEOUS
  Administered 2018-10-21: 3 [IU] via SUBCUTANEOUS

## 2018-10-21 MED ORDER — MORPHINE SULFATE (PF) 2 MG/ML IV SOLN
2.0000 mg | INTRAVENOUS | Status: DC | PRN
  Administered 2018-10-22 – 2018-10-25 (×9): 2 mg via INTRAVENOUS
  Filled 2018-10-21 (×9): qty 1

## 2018-10-21 MED ORDER — HYDROCODONE-ACETAMINOPHEN 5-325 MG PO TABS
1.0000 | ORAL_TABLET | Freq: Four times a day (QID) | ORAL | Status: DC | PRN
  Administered 2018-10-21: 1 via ORAL
  Administered 2018-10-21 (×2): 2 via ORAL
  Administered 2018-10-21: 1 via ORAL
  Administered 2018-10-22 – 2018-10-25 (×6): 2 via ORAL
  Filled 2018-10-21: qty 2
  Filled 2018-10-21: qty 1
  Filled 2018-10-21 (×7): qty 2
  Filled 2018-10-21: qty 1

## 2018-10-21 MED ORDER — CHLORHEXIDINE GLUCONATE CLOTH 2 % EX PADS
6.0000 | MEDICATED_PAD | Freq: Every day | CUTANEOUS | Status: DC
  Administered 2018-10-21 – 2018-10-25 (×5): 6 via TOPICAL

## 2018-10-21 MED ORDER — FAMOTIDINE IN NACL 20-0.9 MG/50ML-% IV SOLN
20.0000 mg | INTRAVENOUS | Status: DC
  Administered 2018-10-22: 20 mg via INTRAVENOUS
  Filled 2018-10-21 (×4): qty 50

## 2018-10-21 MED ORDER — VANCOMYCIN HCL IN DEXTROSE 1-5 GM/200ML-% IV SOLN
INTRAVENOUS | Status: AC
  Administered 2018-10-21: 1000 mg via INTRAVENOUS
  Filled 2018-10-21: qty 200

## 2018-10-21 NOTE — Progress Notes (Signed)
PROGRESS NOTE   [name]  AYT:[id number]    DOB: 1981-03-05    DOA: 10/20/2018  PCP: No primary care provider on file.   I have briefly reviewed patients previous medical records in [facility].  Brief Narrative:  38 year old single male, lives with his mother, independent, extensive PMH: Type I DM with peripheral neuropathy, diabetic gastroparesis, ESRD on MWF HD, multiple lower extremity amputations, essential HTN, presented to the [facility] ED on 10/20/2018 due to intractable nausea, vomiting, diarrhea, swelling of left foot around big toe with drainage and fevers.  Admitted for intractable nausea, vomiting, diarrhea, sepsis due to left diabetic foot infection with abscess and cellulitis.  Podiatry performed I&D in the OR on night of admission.  Nephrology consulted for dialysis needs.  Improving.   Assessment & Plan:   Principal Problem:   Diabetic foot infection ([place]) Active Problems:   Diabetes mellitus type 1 ([place])   Diabetic gastroparesis ([place])   ESRD (end stage renal disease) on dialysis ([place])   HTN (hypertension)   Sepsis ([place])   Cellulitis and abscess of foot   Nausea vomiting and diarrhea   Septic arthritis of left foot ([place])   DM (diabetes mellitus), secondary, uncontrolled, with neurologic complications ([place])   Assessment and plan:   1. Intractable nausea, vomiting and diarrhea: DD: Acute viral GE, diabetic gastroparesis flare, related to sepsis from diabetic foot infection versus other etiologies.  C. difficile testing and GI panel PCR negative.  Abdominal CT without contrast without acute findings.  Treated supportively with bowel rest, gentle IV fluids, IV Pepcid, PRN antiemetics.  Has been tolerating clear liquids post surgery since last night.  Advance to regular diet and monitor closely.  Does have chronic nausea. Outpatient follow-up with GI regarding chronic fecal incontinence. 2. Sepsis due to left diabetic foot infection/abscess and cellulitis: Concern for  abscess/cellulitis around left great toe and medial forefoot.  Not sure if patient has osteomyelitis of left fifth metatarsal.  Met sepsis criteria on admission.  Received gentle IV fluids.  Continue empirically started IV vancomycin, cefepime and metronidazole.  Podiatry/Dr. March Rummage consulted and performed extensive I&D in OR on night of admission.  Blood cultures x2: Negative to date.  Abscess culture shows moderate Staphylococcus aureus-final results pending.  Management per Dr. March Rummage. 3. Left diabetic foot infection:  Management as per problem #2.  Improving. 4. Uncontrolled type I DM with peripheral neuropathy and renal complications: Placed on reduced dose of Lantus 10 units at bedtime and sensitive SSI.  Monitor closely and adjust insulins as needed.  Clinically does not appear to be in DKA.  Elevated anion gap likely related to ESRD.  Uncontrolled and fluctuating.  Patient insists on regular diet and not diabetic diet.  Increase Lantus back to 10 units at bedtime.  Adjust insulins as needed. 5. ESRD on MWF HD: Last dialyzed on 1/22.  Nephrology consulted and supposed to have HD 1/24. 6. Essential hypertension: Not on antihypertensives at home.  Controlled. 7. Leukocytosis: Secondary to sepsis.  Follow daily CBCs 8. Anemia: Follow CBC in a.m.    DVT prophylaxis: Subcutaneous heparin Code Status: Full Family Communication: None at bedside Disposition: DC home pending clinical improvement   Consultants:  Podiatry//Dr. Hardie Pulley [facility]   Procedures:  I&D of left foot abscess in OR 1/23  Antimicrobials:  IV vancomycin, cefepime and metronidazole   Subjective: Feels better this morning.  Has been drinking clear liquids 2 hours after surgery last night and tolerating same without vomiting.  Has  chronic nausea.  No abdominal pain.  Has not had a BM since last night.  Wants regular diet and not diabetic or renal diet.  No left foot pain.  ROS: As above.  Objective:  Vitals:    10/21/18 1430 10/21/18 1500 10/21/18 1530 10/21/18 1536  BP: 131/80 125/81 140/89 (!) 144/85  Pulse: 97 92 97 100  Resp:    18  Temp:    97.8 F (36.6 C)  TempSrc:    Oral  SpO2:      Weight:        Examination:  General exam: Pleasant young male, moderately built and nourished lying comfortably propped up in bed.  Oral mucosa moist. Respiratory system: Clear to auscultation. Respiratory effort normal. Cardiovascular system: S1 & S2 heard, RRR. No JVD, murmurs, rubs, gallops or clicks. No pedal edema. Gastrointestinal system: Abdomen is nondistended, soft and nontender. No organomegaly or masses felt. Normal bowel sounds heard. Central nervous system: Alert and oriented. No focal neurological deficits. Extremities: Symmetric 5 x 5 power. Skin: Multiple tattoos over her body.  Left foot and ankle postop dressing clean and dry. Psychiatry: Judgement and insight appear normal. Mood & affect appropriate.     Data Reviewed: I have personally reviewed following labs and imaging studies  CBC: Recent Labs  Lab 10/20/18 1013 10/20/18 1350 10/21/18 1022  WBC 26.3*  --  22.7*  HGB 12.8* 16.3 11.5*  HCT 41.1 48.0 36.1*  MCV 95.1  --  93.5  PLT 166  --  505   Basic Metabolic Panel: Recent Labs  Lab 10/20/18 1013 10/20/18 1350 10/21/18 1022  NA 130* 127* 132*  K 4.3 4.5 3.0*  CL 89*  --  94*  CO2 25  --  21*  GLUCOSE 210*  --  250*  BUN 31*  --  21*  CREATININE 8.82*  --  5.72*  CALCIUM 9.4  --  8.6*   Liver Function Tests: Recent Labs  Lab 10/20/18 1013  AST 23  ALT 14  ALKPHOS 162*  BILITOT 1.3*  PROT 7.5  ALBUMIN 2.8*   CBG: Recent Labs  Lab 10/20/18 2027 10/20/18 2115 10/20/18 2319 10/21/18 0438 10/21/18 1344  GLUCAP 250* 239* 178* 136* 354*    Recent Results (from the past 240 hour(s))  Blood Culture (routine x 2)     Status: None (Preliminary result)   Collection Time: 10/20/18 12:13 PM  Result Value Ref Range Status   Specimen Description BLOOD  RIGHT ANTECUBITAL  Final   Special Requests   Final    BOTTLES DRAWN AEROBIC AND ANAEROBIC Blood Culture adequate volume   Culture   Final    NO GROWTH 1 DAY Performed at [facility], [place] [address] [place], [place] [address]    Report Status PENDING  Incomplete  Gastrointestinal Panel by PCR , Stool     Status: None   Collection Time: 10/20/18 12:26 PM  Result Value Ref Range Status   Campylobacter species NOT DETECTED NOT DETECTED Final   Plesimonas shigelloides NOT DETECTED NOT DETECTED Final   Salmonella species NOT DETECTED NOT DETECTED Final   Yersinia enterocolitica NOT DETECTED NOT DETECTED Final   Vibrio species NOT DETECTED NOT DETECTED Final   Vibrio cholerae NOT DETECTED NOT DETECTED Final   Enteroaggregative E coli (EAEC) NOT DETECTED NOT DETECTED Final   Enteropathogenic E coli (EPEC) NOT DETECTED NOT DETECTED Final   Enterotoxigenic E coli (ETEC) NOT DETECTED NOT DETECTED Final   Shiga like toxin producing  E coli (STEC) NOT DETECTED NOT DETECTED Final   Shigella/Enteroinvasive E coli (EIEC) NOT DETECTED NOT DETECTED Final   Cryptosporidium NOT DETECTED NOT DETECTED Final   Cyclospora cayetanensis NOT DETECTED NOT DETECTED Final   Entamoeba histolytica NOT DETECTED NOT DETECTED Final   Giardia lamblia NOT DETECTED NOT DETECTED Final   Adenovirus F40/41 NOT DETECTED NOT DETECTED Final   Astrovirus NOT DETECTED NOT DETECTED Final   Norovirus GI/GII NOT DETECTED NOT DETECTED Final   Rotavirus A NOT DETECTED NOT DETECTED Final   Sapovirus (I, II, IV, and V) NOT DETECTED NOT DETECTED Final    Comment: Performed at [facility], [address] [place], [place] [address]  C difficile quick scan w PCR reflex     Status: None   Collection Time: 10/20/18 12:26 PM  Result Value Ref Range Status   C Diff antigen NEGATIVE NEGATIVE Final   C Diff toxin NEGATIVE NEGATIVE Final   C Diff interpretation No C. difficile detected.  Final    Comment: Performed at  [facility], [place] [address], [place], [place] [address]  Blood Culture (routine x 2)     Status: None (Preliminary result)   Collection Time: 10/20/18  1:37 PM  Result Value Ref Range Status   Specimen Description BLOOD RIGHT HAND  Final   Special Requests   Final    BOTTLES DRAWN AEROBIC AND ANAEROBIC Blood Culture adequate volume   Culture   Final    NO GROWTH 1 DAY Performed at [facility], [place] [address], [place], [place] [address]    Report Status PENDING  Incomplete  Aerobic/Anaerobic Culture (surgical/deep wound)     Status: None (Preliminary result)   Collection Time: 10/20/18  8:37 PM  Result Value Ref Range Status   Specimen Description ABSCESS LEFT FOOT  Final   Special Requests NONE  Final   Gram Stain   Final    FEW WBC PRESENT, PREDOMINANTLY MONONUCLEAR RARE GRAM POSITIVE COCCI Performed at [facility], [address] [place], [place] [address]    Culture MODERATE STAPHYLOCOCCUS AUREUS  Final   Report Status PENDING  Incomplete         Radiology Studies: Ct Abdomen Pelvis Wo Contrast  Result Date: 10/20/2018 CLINICAL DATA:  Nausea, vomiting and diarrhea for 4 days. Patient is on dialysis. EXAM: CT ABDOMEN AND PELVIS WITHOUT CONTRAST TECHNIQUE: Multidetector CT imaging of the abdomen and pelvis was performed following the standard protocol without IV contrast. COMPARISON:  CT scan 07/30/2014 FINDINGS: Lower chest: Mild cardiac enlargement but no pericardial effusion. There are advanced three-vessel coronary artery calcifications for age. Hepatobiliary: No focal hepatic lesions or intrahepatic biliary dilatation. The gallbladder is grossly normal. No common bile duct dilatation. Pancreas: No mass, inflammation or ductal dilatation without contrast. Spleen: Normal size.  No focal lesions. Adrenals/Urinary Tract: The adrenal glands are normal. The kidneys demonstrate renal cortical thinning and atrophy. There are extensive renal artery calcifications  due to diabetes. No hydronephrosis or renal calculi. The bladder is grossly normal. Stomach/Bowel: The stomach, duodenum, small bowel and colon are grossly normal without oral contrast. No obvious acute inflammatory changes, mass lesions or obstructive findings. The terminal ileum and appendix are normal. Vascular/Lymphatic: Scattered aortic calcifications but no aneurysm. The aortic branch vessels are calcified including the SMA and IMA. Small scattered mesenteric and retroperitoneal lymph nodes but no mass or overt adenopathy. Reproductive: The prostate gland and seminal vesicles are normal. Extensive calcifications of the vas deferens  are noted. Other: Small bilateral inguinal lymph nodes likely inflammatory/hyperplastic. No pelvic mass or adenopathy. No free pelvic fluid collections. Musculoskeletal: No significant bony findings. IMPRESSION: 1. No acute abdominal/pelvic findings, mass lesions or adenopathy given limitation of no IV contrast or oral contrast. 2. Extensive small vessel disease due to diabetes. 3. Small kidneys with renal cortical atrophy. 4. Fairly extensive, age advanced, three-vessel coronary artery calcifications. Electronically Signed   By: Marijo Sanes M.D.   On: 10/20/2018 14:49   Dg Chest 1 View  Result Date: 10/20/2018 CLINICAL DATA:  Leukocytosis EXAM: CHEST  1 VIEW COMPARISON:  11/23/2017 FINDINGS: No acute opacity or pleural effusion. Stable cardiomediastinal silhouette. No pneumothorax. Upper extremity stents. IMPRESSION: No active disease. Electronically Signed   By: Donavan Foil M.D.   On: 10/20/2018 15:15   Dg Tibia/fibula Left  Result Date: 10/20/2018 CLINICAL DATA:  Leukocytosis.  Neuropathy. EXAM: LEFT TIBIA AND FIBULA - 2 VIEW COMPARISON:  No recent prior. FINDINGS: No acute bony or joint abnormality identified. No evidence of fracture or dislocation. Peripheral vascular calcification. IMPRESSION: 1. No acute bony or joint abnormality. No evidence of fracture  dislocation. 2.  Peripheral vascular disease. Electronically Signed   By: Marcello Moores  Register   On: 10/20/2018 15:13   Dg Foot Complete Left  Result Date: 10/20/2018 CLINICAL DATA:  Leukocytosis EXAM: LEFT FOOT - COMPLETE 3+ VIEW COMPARISON:  10/19/2018, 08/20/2018 FINDINGS: Status post resection of the fifth digit with small remnant base of fifth metatarsal. Frayed appearance at the cut surface of the metatarsal with patchy lucencies on the lateral view. Vascular calcifications. No soft tissue emphysema. IMPRESSION: Status post resection of the fifth digit with residual base of fifth metatarsal. As compared with 08/20/2018, there is indistinct appearance of the cut margin of the metatarsal with heterogeneous lucencies on the lateral view, raising concern for osteomyelitis. Electronically Signed   By: Donavan Foil M.D.   On: 10/20/2018 15:14        Scheduled Meds: . Chlorhexidine Gluconate Cloth  6 each Topical Q0600  . cinacalcet  30 mg Oral Q supper  . heparin  5,000 Units Subcutaneous Q8H  . insulin aspart  0-5 Units Subcutaneous QHS  . insulin aspart  0-9 Units Subcutaneous TID WC  . insulin glargine  10 Units Subcutaneous QHS  . sevelamer carbonate  2,400 mg Oral BID WC  . sodium chloride flush  3 mL Intravenous Once   Continuous Infusions: . ceFEPime (MAXIPIME) IV    . [START ON 10/22/2018] famotidine (PEPCID) IV    . metronidazole 500 mg (10/21/18 0445)  . vancomycin 1,000 mg (10/21/18 1440)     LOS: 1 day     Vernell Leep, MD, FACP, [facility]. [facility]  To contact the attending provider between 7A-7P or the covering provider during after hours 7P-7A, please log into the web site [email or web address] and access using universal College password for that web site. If you do not have the password, please call the hospital operator.  10/21/2018, 4:28 PM

## 2018-10-21 NOTE — Consult Note (Addendum)
Renal Service Consult Note [name]  [name] 10/21/2018 [name] Requesting Physician:  Dr Algis Liming  Reason for Consult:  ESRD pt w/ diabetic foot infection HPI: The patient is a 38 y.o. year-old w/ hx of DM type 1, ESRD on HD MWF who presented w/ N/V and diabetic L foot infection.  He was here in Nov 2019 for similar issues treated w/ IV abx and surgical Rx per podiatry.  Pt admitted and went to OR yest evening for I&D of abscess, complicated per podiatry.  We are asked to see for ESRD.    Pt is stable.  Has no pain given his neuropathy.  NO recent HD issues.  Very compliant.  No SOB, ankle swelling.   ROS  denies CP  no joint pain   no HA  no blurry vision  no rash  no diarrhea  no nausea/ vomiting     Past Medical History  Past Medical History:  Diagnosis Date  . Diabetes mellitus without complication ([place])   . Diabetic gastroparesis ([place])   . Dialysis patient ([place])   . Hypertension   . Renal disorder    Dialysis  . Sepsis [facility])    Past Surgical History  Past Surgical History:  Procedure Laterality Date  . AMPUTATION Right 02/02/2018   Procedure: RIGHT FIFTH TOE AND METATARSAL AMPUTATION. Filetted toe flap metatarsal resection. Debridement Plantar Foot wound;  Surgeon: Evelina Bucy, DPM;  Location: [place];  Service: Podiatry;  Laterality: Right;  . AMPUTATION Left 08/20/2018   Procedure: FIFTH METATARSAL BONE BIOPSY;  Surgeon: Evelina Bucy, DPM;  Location: [place];  Service: Podiatry;  Laterality: Left;  . APPLICATION OF WOUND VAC  02/02/2018   Procedure: APPLICATION OF WOUND VAC  Right Foot;  Surgeon: Evelina Bucy, DPM;  Location: [place];  Service: Podiatry;;  . AV FISTULA PLACEMENT     left arm.  . AV FISTULA PLACEMENT Right 12/22/2016   Procedure: INSERTION OF ARTERIOVENOUS (AV) GORE-TEX GRAFT ARM;  Surgeon: Elam Dutch, MD;  [facility];  Service: Vascular;  Laterality: Right;  . AV FISTULA PLACEMENT Left 05/26/2018    Procedure: INSERTION OF  ARTERIOVENOUS (AV) GORE-TEX GRAFT LEFT ARM;  Surgeon: Serafina Mitchell, MD;  Location: [place];  Service: Vascular;  Laterality: Left;  . EYE SURGERY    . I&D EXTREMITY Right 10/31/2017   Procedure: IRRIGATION AND DEBRIDEMENT RIGHT FOOT;  Surgeon: Evelina Bucy, DPM;  Location: [place];  Service: Podiatry;  Laterality: Right;  . I&D EXTREMITY Left 08/20/2018   Procedure: IRRIGATION AND DEBRIDEMENT EXTREMITY WITH SECONDARY WOUND CLOSUREAND APPLICATION OF WOUND VAC LEFT FOOT;  Surgeon: Evelina Bucy, DPM;  Location: [place];  Service: Podiatry;  Laterality: Left;  . I&D EXTREMITY Left 10/20/2018   Procedure: IRRIGATION AND DEBRIDEMENT LEFT FOOT  DEBRIDEMENT LATERAL FOOT WOUND;  Surgeon: Evelina Bucy, DPM;  Location: [place];  Service: Podiatry;  Laterality: Left;  . INSERTION OF DIALYSIS CATHETER     Right subclavian  . IR AV DIALY SHUNT INTRO NEEDLE/INTRACATH INITIAL W/PTA/IMG RIGHT Right 02/05/2018  . IR THROMBECTOMY AV FISTULA W/THROMBOLYSIS/PTA INC/SHUNT/IMG LEFT Left 08/24/2018  . IR US GUIDE VASC ACCESS LEFT  08/24/2018  . IR US GUIDE VASC ACCESS RIGHT  02/05/2018  . TRANSMETATARSAL AMPUTATION N/A 08/18/2018   Procedure: IRRIGATION AND DEBRIDEMENT OF LEFT 5TH TOE AND TRANSMETATARSAL, WITH PARTICAL LEFT 5TH TOE AND METATARSAL AMPUTATION, BONE BIOPSY, WOUND VAC APPLICATION.;  Surgeon: Evelina Bucy, DPM;  Location: [place];  Service: Podiatry;  Laterality: N/A;  . UPPER EXTREMITY VENOGRAPHY N/A 11/16/2016   Procedure: Upper Extremity Venography - Right Central;  Surgeon: Elam Dutch, MD;  Location: [place] [facility];  Service: Cardiovascular;  Laterality: N/A;  . UPPER EXTREMITY VENOGRAPHY N/A 05/25/2018   Procedure: UPPER EXTREMITY VENOGRAPHY - Bilateral;  Surgeon: Marty Heck, MD;  Location: [place] [facility];  Service: Cardiovascular;  Laterality: N/A;   Family History  Family History  Problem Relation Age of Onset  . Diabetes Mellitus II Other   .  Diabetes Father   . Renal Disease Father        ESRD   Social History  reports that he has never smoked. He has never used smokeless tobacco. He reports that he does not drink alcohol or use drugs. Allergies No Known Allergies Home medications Prior to Admission medications   Medication Sig Start Date End Date Taking? Authorizing Provider  acetaminophen (TYLENOL) 325 MG tablet Take 2 tablets (650 mg total) by mouth every 6 (six) hours as needed for mild pain or headache (pain/headache). Can restart this after you are done with Oxycodone/Tylenol 11/10/17  Yes Domenic Polite, MD  cinacalcet (SENSIPAR) 30 MG tablet Take 30 mg by mouth daily with supper.   Yes [provider]  clonazePAM (KLONOPIN) 0.5 MG tablet Take 0.5 mg by mouth daily as needed for anxiety (sleep).  07/21/17  Yes [provider]  collagenase (SANTYL) ointment Apply 1 application topically daily. 08/23/18  Yes Elgergawy, Silver Huguenin, MD  insulin aspart (NOVOLOG) 100 UNIT/ML injection Inject 6-7 Units into the skin 2 (two) times daily with a meal.    Yes [provider]  insulin glargine (LANTUS) 100 UNIT/ML injection Inject 0.15 mLs (15 Units total) into the skin at bedtime. 08/23/18  Yes Elgergawy, Silver Huguenin, MD  ondansetron (ZOFRAN ODT) 4 MG disintegrating tablet Take 1 tablet (4 mg total) by mouth every 8 (eight) hours as needed for nausea or vomiting. 11/23/17  Yes Ward, Ozella Almond, PA-C  sevelamer carbonate (RENVELA) 800 MG tablet Take 2,400-3,200 mg by mouth See admin instructions. Take 3 - 4 tablets (2400 mg -3200 mg) by mouth two times daily with meals   Yes [provider]  glucose blood ([employer] TEST STRIPS) test strip by Other route 4 (four) times a day as needed. 10/29/16   [provider]   Liver Function Tests Recent Labs  Lab 10/20/18 1013  AST 23  ALT 14  ALKPHOS 162*  BILITOT 1.3*  PROT 7.5  ALBUMIN 2.8*   Recent Labs  Lab 10/20/18 1013  LIPASE 28    CBC Recent Labs  Lab 10/20/18 1013 10/20/18 1350  WBC 26.3*  --   HGB 12.8* 16.3  HCT 41.1 48.0  MCV 95.1  --   PLT 166  --    Basic Metabolic Panel Recent Labs  Lab 10/20/18 1013 10/20/18 1350  NA 130* 127*  K 4.3 4.5  CL 89*  --   CO2 25  --   GLUCOSE 210*  --   BUN 31*  --   CREATININE 8.82*  --   CALCIUM 9.4  --    Iron/TIBC/Ferritin/ %Sat No results found for: IRON, TIBC, FERRITIN, IRONPCTSAT  Vitals:   10/20/18 2215 10/20/18 2236 10/21/18 0041 10/21/18 0440  BP:  135/74 (!) 139/53 (!) 151/84  Pulse: 81 80 86 92  Resp: 20  18 18   Temp:  98.2 F (36.8 C) 98.2 F (36.8  C) 98.9 F (37.2 C)  TempSrc:  Oral Oral Oral  SpO2: 98% 100% 100% 99%   Exam Gen alert, no distress, calm No rash, cyanosis or gangrene Sclera anicteric, throat clear  No jvd or bruits Chest clear bilat to bases RRR no mrg Abd soft ntnd no mass or ascites +bs GU normal male MS R foot sp toe amps, L foot large dressing, toes perfused Ext no LE edema, no wounds or ulcers Neuro is alert, Ox 3 , nf  LUA AVF+bruit    Home meds:  - insulin aspart SSI ac/ insulin glargine 10u hs  - sevelamer carb 3-4 ac tid/ cinacalcet 30 hs  - prn's   Dialysis: MWF [facility]  4h  75.5kg  Hep 8000  2/2 bath  LUA AVG    - calcitriol 2.5 ug tiw  - venofer 100mg  x 7       Assessment: 1. Diabetic L foot infection: sp I&D last night per podiatry, IV abx 2. ESRD on HD MWF 3. DM type 1 4. Anemia CKD- Hb good no need for esa 5. MBD ckd cont binders/ senispar 6. Volume - no gross excess, wt's pending    P: 1. HD today upstairs       Kelly Splinter MD [employer] pager [phone]   10/21/2018, 11:30 AM

## 2018-10-21 NOTE — Progress Notes (Signed)
Subjective:  Patient ID: [name], male    DOB: 04-25-1981,  MRN: [id number]  Patient seen at bedside. States he felt much better after surgery last night. In good spirits today. Objective:   Vitals:   10/21/18 1530 10/21/18 1536  BP: 140/89 (!) 144/85  Pulse: 97 100  Resp:  18  Temp:  97.8 F (36.6 C)  SpO2:  98%   General AA&O x3. Normal mood and affect.  Vascular Foot warm.  Neurologic Epicritic sensation grossly absent.  Dermatologic Active but appears to be resolving erythema. No active purulence noted today. Sanguinous drainage noted.   Lateral 5th metatarsal wound with healthy bleeding wound base.  Orthopedic: Positive motor distally.   Results for orders placed or performed during the hospital encounter of 10/20/18 (from the past 24 hour(s))  Glucose, capillary     Status: Abnormal   Collection Time: 10/20/18  8:27 PM  Result Value Ref Range   Glucose-Capillary 250 (H) 70 - 99 mg/dL  Aerobic/Anaerobic Culture (surgical/deep wound)     Status: None (Preliminary result)   Collection Time: 10/20/18  8:37 PM  Result Value Ref Range   Specimen Description ABSCESS LEFT FOOT    Special Requests NONE    Gram Stain      FEW WBC PRESENT, PREDOMINANTLY MONONUCLEAR RARE GRAM POSITIVE COCCI Performed at [facility], [place] [address], [place], [place] [address]    Culture MODERATE STAPHYLOCOCCUS AUREUS    Report Status PENDING   Glucose, capillary     Status: Abnormal   Collection Time: 10/20/18  9:15 PM  Result Value Ref Range   Glucose-Capillary 239 (H) 70 - 99 mg/dL  Lactic acid, plasma     Status: None   Collection Time: 10/20/18 10:28 PM  Result Value Ref Range   Lactic Acid, Venous 1.4 0.5 - 1.9 mmol/L  Glucose, capillary     Status: Abnormal   Collection Time: 10/20/18 11:19 PM  Result Value Ref Range   Glucose-Capillary 178 (H) 70 - 99 mg/dL  Glucose, capillary     Status: Abnormal   Collection Time: 10/21/18  4:38 AM  Result Value Ref Range   Glucose-Capillary 136 (H) 70 - 99 mg/dL  Basic metabolic panel     Status: Abnormal   Collection Time: 10/21/18 10:22 AM  Result Value Ref Range   Sodium 132 (L) 135 - 145 mmol/L   Potassium 3.0 (L) 3.5 - 5.1 mmol/L   Chloride 94 (L) 98 - 111 mmol/L   CO2 21 (L) 22 - 32 mmol/L   Glucose, Bld 250 (H) 70 - 99 mg/dL   BUN 21 (H) 6 - 20 mg/dL   Creatinine, Ser 5.72 (H) 0.61 - 1.24 mg/dL   Calcium 8.6 (L) 8.9 - 10.3 mg/dL   GFR calc non Af Amer 11 (L) >60 mL/min   GFR calc Af Amer 13 (L) >60 mL/min   Anion gap 17 (H) 5 - 15  CBC     Status: Abnormal   Collection Time: 10/21/18 10:22 AM  Result Value Ref Range   WBC 22.7 (H) 4.0 - 10.5 K/uL   RBC 3.86 (L) 4.22 - 5.81 MIL/uL   Hemoglobin 11.5 (L) 13.0 - 17.0 g/dL   HCT 36.1 (L) 39.0 - 52.0 %   MCV 93.5 80.0 - 100.0 fL   MCH 29.8 26.0 - 34.0 pg   MCHC 31.9 30.0 - 36.0 g/dL   RDW 14.7 11.5 - 15.5 %   Platelets 151 150 - 400  K/uL   nRBC 0.0 0.0 - 0.2 %  Glucose, capillary     Status: Abnormal   Collection Time: 10/21/18  1:44 PM  Result Value Ref Range   Glucose-Capillary 354 (H) 70 - 99 mg/dL  Glucose, capillary     Status: Abnormal   Collection Time: 10/21/18  4:46 PM  Result Value Ref Range   Glucose-Capillary 206 (H) 70 - 99 mg/dL   Comment 1 Notify RN    Results for orders placed or performed during the hospital encounter of 10/20/18  Blood Culture (routine x 2)     Status: None (Preliminary result)   Collection Time: 10/20/18 12:13 PM  Result Value Ref Range Status   Specimen Description BLOOD RIGHT ANTECUBITAL  Final   Special Requests   Final    BOTTLES DRAWN AEROBIC AND ANAEROBIC Blood Culture adequate volume   Culture   Final    NO GROWTH 1 DAY Performed at [facility], [place] [address], [place], [place] [address]    Report Status PENDING  Incomplete  Gastrointestinal Panel by PCR , Stool     Status: None   Collection Time: 10/20/18 12:26 PM  Result Value Ref Range Status   Campylobacter species NOT DETECTED  NOT DETECTED Final   Plesimonas shigelloides NOT DETECTED NOT DETECTED Final   Salmonella species NOT DETECTED NOT DETECTED Final   Yersinia enterocolitica NOT DETECTED NOT DETECTED Final   Vibrio species NOT DETECTED NOT DETECTED Final   Vibrio cholerae NOT DETECTED NOT DETECTED Final   Enteroaggregative E coli (EAEC) NOT DETECTED NOT DETECTED Final   Enteropathogenic E coli (EPEC) NOT DETECTED NOT DETECTED Final   Enterotoxigenic E coli (ETEC) NOT DETECTED NOT DETECTED Final   Shiga like toxin producing E coli (STEC) NOT DETECTED NOT DETECTED Final   Shigella/Enteroinvasive E coli (EIEC) NOT DETECTED NOT DETECTED Final   Cryptosporidium NOT DETECTED NOT DETECTED Final   Cyclospora cayetanensis NOT DETECTED NOT DETECTED Final   Entamoeba histolytica NOT DETECTED NOT DETECTED Final   Giardia lamblia NOT DETECTED NOT DETECTED Final   Adenovirus F40/41 NOT DETECTED NOT DETECTED Final   Astrovirus NOT DETECTED NOT DETECTED Final   Norovirus GI/GII NOT DETECTED NOT DETECTED Final   Rotavirus A NOT DETECTED NOT DETECTED Final   Sapovirus (I, II, IV, and V) NOT DETECTED NOT DETECTED Final    Comment: Performed at [facility], [place] [place], [place] [address]  C difficile quick scan w PCR reflex     Status: None   Collection Time: 10/20/18 12:26 PM  Result Value Ref Range Status   C Diff antigen NEGATIVE NEGATIVE Final   C Diff toxin NEGATIVE NEGATIVE Final   C Diff interpretation No C. difficile detected.  Final    Comment: Performed at [facility], [place] [address], [place], [place] [address]  Blood Culture (routine x 2)     Status: None (Preliminary result)   Collection Time: 10/20/18  1:37 PM  Result Value Ref Range Status   Specimen Description BLOOD RIGHT HAND  Final   Special Requests   Final    BOTTLES DRAWN AEROBIC AND ANAEROBIC Blood Culture adequate volume   Culture   Final    NO GROWTH 1 DAY Performed at [facility], [place] [address],  [place], [place] [address]    Report Status PENDING  Incomplete  Aerobic/Anaerobic Culture (surgical/deep wound)     Status: None (Preliminary result)   Collection Time: 10/20/18  8:37 PM  Result Value Ref Range Status   Specimen Description ABSCESS LEFT FOOT  Final   Special Requests NONE  Final   Gram Stain   Final    FEW WBC PRESENT, PREDOMINANTLY MONONUCLEAR RARE GRAM POSITIVE COCCI Performed at [facility], [place] [address], [place], [place] [address]    Culture MODERATE STAPHYLOCOCCUS AUREUS  Final   Report Status PENDING  Incomplete    Assessment & Plan:  Patien was evaluated and treated and all questions answered.  Abscess Left Foot -s/p I&D for multifocal abscess. Responding well. WBC downtrending -Continue empiric abx. -Micro pending, currently growing Staph. -Will plan for RTOR Sunday for repeat washout, debridement, delayed wound closure. Would likely be ok for discharge the day after the procedure with appropriate abx therapy. -Nursing to change packing tomorrow. -Packing changed by DPM today.  S/p Left 5th Partial Ray Resection with ?Residual OM -Wound found to be healed intra-op, and unlikely to be related to abscess formation noted at the great toe. -Will continue to monitor.

## 2018-10-22 LAB — BASIC METABOLIC PANEL
Anion gap: 17 — ABNORMAL HIGH (ref 5–15)
Anion gap: 15 (ref 5–15)
BUN: 21 mg/dL — ABNORMAL HIGH (ref 6–20)
BUN: 35 mg/dL — ABNORMAL HIGH (ref 6–20)
CO2: 21 mmol/L — ABNORMAL LOW (ref 22–32)
CO2: 26 mmol/L (ref 22–32)
Calcium: 8.6 mg/dL — ABNORMAL LOW (ref 8.9–10.3)
Calcium: 8.9 mg/dL (ref 8.9–10.3)
Chloride: 94 mmol/L — ABNORMAL LOW (ref 98–111)
Chloride: 89 mmol/L — ABNORMAL LOW (ref 98–111)
Creatinine, Ser: 5.72 mg/dL — ABNORMAL HIGH (ref 0.61–1.24)
Creatinine, Ser: 8.14 mg/dL — ABNORMAL HIGH (ref 0.61–1.24)
GFR calc Af Amer: 13 mL/min — ABNORMAL LOW (ref >60)
GFR calc Af Amer: 9 mL/min — ABNORMAL LOW (ref >60)
GFR calc non Af Amer: 11 mL/min — ABNORMAL LOW (ref >60)
GFR calc non Af Amer: 7 mL/min — ABNORMAL LOW (ref >60)
Glucose, Bld: 250 mg/dL — ABNORMAL HIGH (ref 70–99)
Glucose, Bld: 141 mg/dL — ABNORMAL HIGH (ref 70–99)
Potassium: 3 mmol/L — ABNORMAL LOW (ref 3.5–5.1)
Potassium: 3.3 mmol/L — ABNORMAL LOW (ref 3.5–5.1)
Sodium: 132 mmol/L — ABNORMAL LOW (ref 135–145)
Sodium: 130 mmol/L — ABNORMAL LOW (ref 135–145)

## 2018-10-22 LAB — GLUCOSE, CAPILLARY
Glucose-Capillary: 534 mg/dL (ref 70–99)
Glucose-Capillary: 456 mg/dL — ABNORMAL HIGH (ref 70–99)
Glucose-Capillary: 343 mg/dL — ABNORMAL HIGH (ref 70–99)
Glucose-Capillary: 219 mg/dL — ABNORMAL HIGH (ref 70–99)
Glucose-Capillary: 73 mg/dL (ref 70–99)
Glucose-Capillary: 83 mg/dL (ref 70–99)
Glucose-Capillary: 117 mg/dL — ABNORMAL HIGH (ref 70–99)
Glucose-Capillary: 265 mg/dL — ABNORMAL HIGH (ref 70–99)

## 2018-10-22 LAB — HEPATITIS B SURFACE ANTIGEN: Hepatitis B Surface Ag: NEGATIVE

## 2018-10-22 LAB — CBC
HCT: 36.6 % — ABNORMAL LOW (ref 39.0–52.0)
Hemoglobin: 11.4 g/dL — ABNORMAL LOW (ref 13.0–17.0)
MCH: 29.1 pg (ref 26.0–34.0)
MCHC: 31.1 g/dL (ref 30.0–36.0)
MCV: 93.4 fL (ref 80.0–100.0)
Platelets: 191 10*3/uL (ref 150–400)
RBC: 3.92 MIL/uL — ABNORMAL LOW (ref 4.22–5.81)
RDW: 14.4 % (ref 11.5–15.5)
WBC: 17.8 10*3/uL — ABNORMAL HIGH (ref 4.0–10.5)
nRBC: 0 % (ref 0.0–0.2)

## 2018-10-22 LAB — HEPATITIS B CORE ANTIBODY, TOTAL: Hep B Core Total Ab: NEGATIVE

## 2018-10-22 LAB — WOUND CULTURE
MICRO NUMBER:: 89374
SPECIMEN QUALITY:: ADEQUATE

## 2018-10-22 LAB — HEPATITIS B SURFACE ANTIBODY,QUALITATIVE: Hep B S Ab: REACTIVE

## 2018-10-22 MED ORDER — PHENOL 1.4 % MT LIQD
1.0000 | OROMUCOSAL | Status: DC | PRN
  Administered 2018-10-23: 1 via OROMUCOSAL

## 2018-10-22 MED ORDER — INSULIN ASPART 100 UNIT/ML ~~LOC~~ SOLN
3.0000 [IU] | Freq: Three times a day (TID) | SUBCUTANEOUS | Status: DC
  Administered 2018-10-22 – 2018-10-25 (×6): 3 [IU] via SUBCUTANEOUS

## 2018-10-22 MED ORDER — CEFAZOLIN SODIUM-DEXTROSE 2-4 GM/100ML-% IV SOLN
2.0000 g | INTRAVENOUS | Status: DC
  Filled 2018-10-22: qty 100

## 2018-10-22 MED ORDER — INSULIN ASPART 100 UNIT/ML ~~LOC~~ SOLN
15.0000 [IU] | Freq: Once | SUBCUTANEOUS | Status: AC
  Administered 2018-10-22: 15 [IU] via SUBCUTANEOUS

## 2018-10-22 MED ORDER — CALCITRIOL 0.5 MCG PO CAPS
2.5000 ug | ORAL_CAPSULE | ORAL | Status: DC
  Administered 2018-10-24: 2.5 ug via ORAL

## 2018-10-22 MED ORDER — CEFAZOLIN SODIUM-DEXTROSE 2-4 GM/100ML-% IV SOLN
2.0000 g | Freq: Once | INTRAVENOUS | Status: AC
  Administered 2018-10-22: 2 g via INTRAVENOUS
  Filled 2018-10-22 (×2): qty 100

## 2018-10-22 MED ORDER — INSULIN ASPART 100 UNIT/ML ~~LOC~~ SOLN: 0.0000 [IU] | Freq: Every day | SUBCUTANEOUS | Status: DC

## 2018-10-22 MED ORDER — INSULIN ASPART 100 UNIT/ML ~~LOC~~ SOLN
0.0000 [IU] | Freq: Three times a day (TID) | SUBCUTANEOUS | Status: DC
  Administered 2018-10-22: 11 [IU] via SUBCUTANEOUS
  Administered 2018-10-22: 5 [IU] via SUBCUTANEOUS
  Administered 2018-10-23: 11 [IU] via SUBCUTANEOUS
  Administered 2018-10-23: 5 [IU] via SUBCUTANEOUS
  Administered 2018-10-24: 15 [IU] via SUBCUTANEOUS

## 2018-10-22 NOTE — Progress Notes (Addendum)
Subjective:   Tolerated HD yest on schedule, minimal foot discomfort decreased with  pain  med's   Objective Vital signs in last 24 hours: Vitals:   10/21/18 1530 10/21/18 1536 10/21/18 2053 10/22/18 0336  BP: 140/89 (!) 144/85 (!) 128/92 (!) 151/86  Pulse: 97 100 96 100  Resp:  18    Temp:  97.8 F (36.6 C) 98.4 F (36.9 C) 98.3 F (36.8 C)  TempSrc:  Oral Oral Oral  SpO2:  98% 98% 100%  Weight:  74.4 kg     Weight change:   Physical Exam: General: Alert, NAD , Heart: RRR , no m,r,g Lungs: CTA bilat  Abdomen: BS pos, soft, NT, ND Extremities: No pedal edema, R Foot sp Toe amps, L foot dressing  Dialysis Access: LUA AVF + bruit   Home meds:  - insulin aspart SSI ac/ insulin glargine 10u hs  - sevelamer carb 3-4 ac tid/ cinacalcet 30 hs  - prn's  OP Dialysis: MWF [facility]  4h  75.5kg  Hep 8000  2/2 bath  LUA AVG    - calcitriol 2.5 ug tiw  - venofer 100mg  x 7  Problem/Plan: 1. Diabetic Foot Infection SP I&D by Dr March Rummage 10/21/18 ,IV abx , noted plans for Sunday 2nd I&D  2. ESRD - MWF on schedule , k 3.0 eating well fu k am labs 3. Anemia - HGB 11.5 no esa needs, Hold iron with infection  4. Secondary hyperparathyroidism - calcitriol on hd , sensipar  5. HTN/volume - no excess volume on exam or admit , no home bp meds , slightly below edw , stated  He can stand for wts.(if allowed by Dr March Rummage) 6. IDDM type 1    [name], PA-C [name] Beeper [phone] 10/22/2018,9:10 AM  LOS: 2 days   Pt seen, examined and agree w A/P as above.  Kelly Splinter MD [employer] pager [phone]   10/22/2018, 1:26 PM    Labs: Basic Metabolic Panel: Recent Labs  Lab 10/20/18 1013 10/20/18 1350 10/21/18 1022  NA 130* 127* 132*  K 4.3 4.5 3.0*  CL 89*  --  94*  CO2 25  --  21*  GLUCOSE 210*  --  250*  BUN 31*  --  21*  CREATININE 8.82*  --  5.72*  CALCIUM 9.4  --  8.6*   Liver Function Tests: Recent Labs  Lab 10/20/18 1013  AST 23  ALT  14  ALKPHOS 162*  BILITOT 1.3*  PROT 7.5  ALBUMIN 2.8*   Recent Labs  Lab 10/20/18 1013  LIPASE 28   No results for input(s): AMMONIA in the last 168 hours. CBC: Recent Labs  Lab 10/20/18 1013 10/20/18 1350 10/21/18 1022  WBC 26.3*  --  22.7*  HGB 12.8* 16.3 11.5*  HCT 41.1 48.0 36.1*  MCV 95.1  --  93.5  PLT 166  --  151   Cardiac Enzymes: No results for input(s): CKTOTAL, CKMB, CKMBINDEX, TROPONINI in the last 168 hours. CBG: Recent Labs  Lab 10/21/18 1646 10/21/18 2051 10/22/18 0501 10/22/18 0617 10/22/18 0736  GLUCAP 206* 282* 534* 456* 343*    Studies/Results: Ct Abdomen Pelvis Wo Contrast  Result Date: 10/20/2018 CLINICAL DATA:  Nausea, vomiting and diarrhea for 4 days. Patient is on dialysis. EXAM: CT ABDOMEN AND PELVIS WITHOUT CONTRAST TECHNIQUE: Multidetector CT imaging of the abdomen and pelvis was performed following the standard protocol without IV contrast. COMPARISON:  CT scan 07/30/2014 FINDINGS: Lower chest: Mild cardiac enlargement  but no pericardial effusion. There are advanced three-vessel coronary artery calcifications for age. Hepatobiliary: No focal hepatic lesions or intrahepatic biliary dilatation. The gallbladder is grossly normal. No common bile duct dilatation. Pancreas: No mass, inflammation or ductal dilatation without contrast. Spleen: Normal size.  No focal lesions. Adrenals/Urinary Tract: The adrenal glands are normal. The kidneys demonstrate renal cortical thinning and atrophy. There are extensive renal artery calcifications due to diabetes. No hydronephrosis or renal calculi. The bladder is grossly normal. Stomach/Bowel: The stomach, duodenum, small bowel and colon are grossly normal without oral contrast. No obvious acute inflammatory changes, mass lesions or obstructive findings. The terminal ileum and appendix are normal. Vascular/Lymphatic: Scattered aortic calcifications but no aneurysm. The aortic branch vessels are calcified including  the SMA and IMA. Small scattered mesenteric and retroperitoneal lymph nodes but no mass or overt adenopathy. Reproductive: The prostate gland and seminal vesicles are normal. Extensive calcifications of the vas deferens are noted. Other: Small bilateral inguinal lymph nodes likely inflammatory/hyperplastic. No pelvic mass or adenopathy. No free pelvic fluid collections. Musculoskeletal: No significant bony findings. IMPRESSION: 1. No acute abdominal/pelvic findings, mass lesions or adenopathy given limitation of no IV contrast or oral contrast. 2. Extensive small vessel disease due to diabetes. 3. Small kidneys with renal cortical atrophy. 4. Fairly extensive, age advanced, three-vessel coronary artery calcifications. Electronically Signed   By: Marijo Sanes M.D.   On: 10/20/2018 14:49   Dg Chest 1 View  Result Date: 10/20/2018 CLINICAL DATA:  Leukocytosis EXAM: CHEST  1 VIEW COMPARISON:  11/23/2017 FINDINGS: No acute opacity or pleural effusion. Stable cardiomediastinal silhouette. No pneumothorax. Upper extremity stents. IMPRESSION: No active disease. Electronically Signed   By: Donavan Foil M.D.   On: 10/20/2018 15:15   Dg Tibia/fibula Left  Result Date: 10/20/2018 CLINICAL DATA:  Leukocytosis.  Neuropathy. EXAM: LEFT TIBIA AND FIBULA - 2 VIEW COMPARISON:  No recent prior. FINDINGS: No acute bony or joint abnormality identified. No evidence of fracture or dislocation. Peripheral vascular calcification. IMPRESSION: 1. No acute bony or joint abnormality. No evidence of fracture dislocation. 2.  Peripheral vascular disease. Electronically Signed   By: Marcello Moores  Register   On: 10/20/2018 15:13   Dg Foot Complete Left  Result Date: 10/20/2018 CLINICAL DATA:  Leukocytosis EXAM: LEFT FOOT - COMPLETE 3+ VIEW COMPARISON:  10/19/2018, 08/20/2018 FINDINGS: Status post resection of the fifth digit with small remnant base of fifth metatarsal. Frayed appearance at the cut surface of the metatarsal with patchy  lucencies on the lateral view. Vascular calcifications. No soft tissue emphysema. IMPRESSION: Status post resection of the fifth digit with residual base of fifth metatarsal. As compared with 08/20/2018, there is indistinct appearance of the cut margin of the metatarsal with heterogeneous lucencies on the lateral view, raising concern for osteomyelitis. Electronically Signed   By: Donavan Foil M.D.   On: 10/20/2018 15:14   Medications: . ceFEPime (MAXIPIME) IV Stopped (10/21/18 2110)  . famotidine (PEPCID) IV    . metronidazole 500 mg (10/22/18 0817)  . vancomycin 1,000 mg (10/21/18 1440)   . Chlorhexidine Gluconate Cloth  6 each Topical Q0600  . cinacalcet  30 mg Oral Q supper  . heparin  5,000 Units Subcutaneous Q8H  . insulin aspart  0-15 Units Subcutaneous TID WC  . insulin aspart  0-5 Units Subcutaneous QHS  . insulin aspart  3 Units Subcutaneous TID WC  . insulin glargine  15 Units Subcutaneous QHS  . sevelamer carbonate  2,400 mg Oral BID WC  . sodium  chloride flush  3 mL Intravenous Once

## 2018-10-22 NOTE — Plan of Care (Signed)
  Problem: Education: Goal: Knowledge of General Education information will improve Description: Including pain rating scale, medication(s)/side effects and non-pharmacologic comfort measures Outcome: Progressing   Problem: Clinical Measurements: Goal: Will remain free from infection Outcome: Progressing   Problem: Activity: Goal: Risk for activity intolerance will decrease Outcome: Progressing   Problem: Pain Managment: Goal: General experience of comfort will improve Outcome: Progressing   Problem: Safety: Goal: Ability to remain free from injury will improve Outcome: Progressing   Problem: Skin Integrity: Goal: Risk for impaired skin integrity will decrease Outcome: Progressing   

## 2018-10-22 NOTE — Progress Notes (Signed)
PROGRESS NOTE   MY MADARIAGA  ZOX:[id number]    DOB: Oct 31, 1980    DOA: 10/20/2018  PCP: No primary care provider on file.   I have briefly reviewed patients previous medical records in [facility]  Brief Narrative:  38 year old single male, lives with his mother, independent, extensive PMH: Type I DM with peripheral neuropathy, diabetic gastroparesis, ESRD on MWF HD, multiple lower extremity amputations, essential HTN, presented to the [facility] ED on 10/20/2018 due to intractable nausea, vomiting, diarrhea, swelling of left foot around big toe with drainage and fevers.  Admitted for intractable nausea, vomiting, diarrhea, sepsis due to left diabetic foot infection with abscess and cellulitis.  Podiatry performed I&D in the OR on night of admission.  Nephrology consulted for dialysis needs.  Improving.   Assessment & Plan:   Principal Problem:   Diabetic foot infection ([place]) Active Problems:   Diabetes mellitus type 1 ([place])   Diabetic gastroparesis ([place])   ESRD (end stage renal disease) on dialysis ([place])   HTN (hypertension)   Sepsis ([place])   Cellulitis and abscess of foot   Nausea vomiting and diarrhea   Septic arthritis of left foot ([place])   DM (diabetes mellitus), secondary, uncontrolled, with neurologic complications ([place])   Assessment and plan:   1. Intractable nausea, vomiting and diarrhea: DD: Acute viral GE, diabetic gastroparesis flare, related to sepsis from diabetic foot infection versus other etiologies.  C. difficile testing and GI panel PCR negative.  Abdominal CT without contrast without acute findings.  Treated supportively with bowel rest, gentle IV fluids, IV Pepcid, PRN antiemetics. Does have chronic nausea.   Resolved.  Outpatient follow-up with GI regarding chronic fecal incontinence. 2. Sepsis due to left diabetic foot infection/abscess and cellulitis: Concern for abscess/cellulitis around left great toe and medial forefoot.  Not sure if patient has osteomyelitis  of left fifth metatarsal.  Met sepsis criteria on admission.  Received gentle IV fluids.  Empirically started IV vancomycin, cefepime and metronidazole.  Podiatry/Dr. March Rummage consulted and performed extensive I&D in OR on night of admission.  Blood cultures x2: Negative to date.  Abscess culture shows pansensitive/MSSA.  Discontinued vancomycin, cefepime and metronidazole and started IV cefazolin.  Possibly Keflex or doxycycline at discharge.  Sepsis resolved.  As per Dr. March Rummage follow-up, plan repeat exploration in OR on 1/26 and potential discharge home on 1/27 3. Left diabetic foot infection:  Management as per problem #2.  Improving. 4. Uncontrolled type I DM with peripheral neuropathy and renal complications: Placed on reduced dose of Lantus 10 units at bedtime and sensitive SSI.  Monitor closely and adjust insulins as needed.  Clinically does not appear to be in DKA.  Elevated anion gap likely related to ESRD.  Uncontrolled and fluctuating.  Patient insists on regular diet and not diabetic diet.  Poorly controlled DM due to extreme noncompliance with diet despite extensive counseling.  CBGs up to 534 this morning.  Patient eats whatever he wants.  Adjusted insulins.  Increased Lantus to 20 units at bedtime, changed SSI to moderate sensitivity and added mealtime NovoLog 3 units.  Monitor and adjust insulins as needed.  A1c in November was 7.3, will repeat. 5. ESRD on MWF HD: Last dialyzed on 1/22 & 1/24.  Next HD on 1/27. 6. Essential hypertension: Not on antihypertensives at home.  Controlled. 7. Leukocytosis: Secondary to sepsis.    Improving. 8. Anemia: Stable.    DVT prophylaxis: Subcutaneous heparin Code Status: Full Family Communication: None at bedside Disposition: DC home  pending clinical improvement, hopefully 1/27 after dialysis.   Consultants:  Podiatry//Dr. Hardie Pulley Nephrology   Procedures:  I&D of left foot abscess in OR 1/23 HD on 1/24  Antimicrobials:  Discontinued IV  vancomycin, cefepime and metronidazole. IV cefazolin 1/25 >   Subjective: Denies complaints.  Patient interviewed and examined along with his RN.  Explained regarding extremely high blood sugars and complications associated with it including poor wound healing and outlined others.  Patient insists on regular diet.  ROS: As above.  Objective:  Vitals:   10/21/18 1530 10/21/18 1536 10/21/18 2053 10/22/18 0336  BP: 140/89 (!) 144/85 (!) 128/92 (!) 151/86  Pulse: 97 100 96 100  Resp:  18    Temp:  97.8 F (36.6 C) 98.4 F (36.9 C) 98.3 F (36.8 C)  TempSrc:  Oral Oral Oral  SpO2:  98% 98% 100%  Weight:  74.4 kg      Examination:  General exam: Pleasant young male, moderately built and nourished lying comfortably propped up in bed.  Oral mucosa moist. Respiratory system: Clear to auscultation. Respiratory effort normal. Cardiovascular system: S1 & S2 heard, RRR. No JVD, murmurs, rubs, gallops or clicks. No pedal edema. Gastrointestinal system: Abdomen is nondistended, soft and nontender. No organomegaly or masses felt. Normal bowel sounds heard. Central nervous system: Alert and oriented. No focal neurological deficits. Extremities: Symmetric 5 x 5 power. Skin: Multiple tattoos over her body.  Left foot and ankle postop dressing clean and dry. Psychiatry: Judgement and insight appear normal. Mood & affect appropriate.     Data Reviewed: I have personally reviewed following labs and imaging studies  CBC: Recent Labs  Lab 10/20/18 1013 10/20/18 1350 10/21/18 1022 10/22/18 1233  WBC 26.3*  --  22.7* 17.8*  HGB 12.8* 16.3 11.5* 11.4*  HCT 41.1 48.0 36.1* 36.6*  MCV 95.1  --  93.5 93.4  PLT 166  --  151 259   Basic Metabolic Panel: Recent Labs  Lab 10/20/18 1013 10/20/18 1350 10/21/18 1022  NA 130* 127* 132*  K 4.3 4.5 3.0*  CL 89*  --  94*  CO2 25  --  21*  GLUCOSE 210*  --  250*  BUN 31*  --  21*  CREATININE 8.82*  --  5.72*  CALCIUM 9.4  --  8.6*   Liver  Function Tests: Recent Labs  Lab 10/20/18 1013  AST 23  ALT 14  ALKPHOS 162*  BILITOT 1.3*  PROT 7.5  ALBUMIN 2.8*   CBG: Recent Labs  Lab 10/21/18 2051 10/22/18 0501 10/22/18 0617 10/22/18 0736 10/22/18 1137  GLUCAP 282* 534* 456* 343* 219*    Recent Results (from the past 240 hour(s))  WOUND CULTURE     Status: Abnormal   Collection Time: 10/19/18  3:15 PM  Result Value Ref Range Status   MICRO NUMBER: [id number]  Final   SPECIMEN QUALITY: Adequate  Final   SOURCE: LEFT FOOT LATERAL  Final   STATUS: FINAL  Final   GRAM STAIN:   Final    No white blood cells seen No epithelial cells seen Few Gram positive cocci in pairs   ISOLATE 1: Staphylococcus aureus (A)  Final    Comment: Heavy growth of Staphylococcus aureus      Susceptibility   Staphylococcus aureus - AEROBIC CULT, GRAM STAIN POSITIVE 1    VANCOMYCIN <=0.5 Sensitive     CIPROFLOXACIN <=0.5 Sensitive     CLINDAMYCIN <=0.25 Sensitive     LEVOFLOXACIN <=0.12 Sensitive  ERYTHROMYCIN <=0.25 Sensitive     GENTAMICIN <=0.5 Sensitive     OXACILLIN* <=0.25 Sensitive      * Oxacillin-susceptible staphylococci aresusceptible to other penicillinase-stablepenicillins (e.g. Methicillin, Nafcillin), beta-lactam/beta-lactamase inhibitor combinations, andcephems with staphylococcal indications, includingCefazolin.    TETRACYCLINE <=1 Sensitive     TRIMETH/SULFA* <=10 Sensitive      * Oxacillin-susceptible staphylococci aresusceptible to other penicillinase-stablepenicillins (e.g. Methicillin, Nafcillin), beta-lactam/beta-lactamase inhibitor combinations, andcephems with staphylococcal indications, includingCefazolin.Legend:S = Susceptible  I = IntermediateR = Resistant  NS = Not susceptible* = Not tested  NR = Not reported**NN = See antimicrobic comments  Blood Culture (routine x 2)     Status: None (Preliminary result)   Collection Time: 10/20/18 12:13 PM  Result Value Ref Range Status   Specimen Description BLOOD RIGHT  ANTECUBITAL  Final   Special Requests   Final    BOTTLES DRAWN AEROBIC AND ANAEROBIC Blood Culture adequate volume   Culture   Final    NO GROWTH 2 DAYS Performed at [facility], [place] [address], [place], [place] [address]    Report Status PENDING  Incomplete  Gastrointestinal Panel by PCR , Stool     Status: None   Collection Time: 10/20/18 12:26 PM  Result Value Ref Range Status   Campylobacter species NOT DETECTED NOT DETECTED Final   Plesimonas shigelloides NOT DETECTED NOT DETECTED Final   Salmonella species NOT DETECTED NOT DETECTED Final   Yersinia enterocolitica NOT DETECTED NOT DETECTED Final   Vibrio species NOT DETECTED NOT DETECTED Final   Vibrio cholerae NOT DETECTED NOT DETECTED Final   Enteroaggregative E coli (EAEC) NOT DETECTED NOT DETECTED Final   Enteropathogenic E coli (EPEC) NOT DETECTED NOT DETECTED Final   Enterotoxigenic E coli (ETEC) NOT DETECTED NOT DETECTED Final   Shiga like toxin producing E coli (STEC) NOT DETECTED NOT DETECTED Final   Shigella/Enteroinvasive E coli (EIEC) NOT DETECTED NOT DETECTED Final   Cryptosporidium NOT DETECTED NOT DETECTED Final   Cyclospora cayetanensis NOT DETECTED NOT DETECTED Final   Entamoeba histolytica NOT DETECTED NOT DETECTED Final   Giardia lamblia NOT DETECTED NOT DETECTED Final   Adenovirus F40/41 NOT DETECTED NOT DETECTED Final   Astrovirus NOT DETECTED NOT DETECTED Final   Norovirus GI/GII NOT DETECTED NOT DETECTED Final   Rotavirus A NOT DETECTED NOT DETECTED Final   Sapovirus (I, II, IV, and V) NOT DETECTED NOT DETECTED Final    Comment: Performed at [facility], [address] [place], [place] [address]  C difficile quick scan w PCR reflex     Status: None   Collection Time: 10/20/18 12:26 PM  Result Value Ref Range Status   C Diff antigen NEGATIVE NEGATIVE Final   C Diff toxin NEGATIVE NEGATIVE Final   C Diff interpretation No C. difficile detected.  Final    Comment: Performed at [facility], [place] [address], [place], [place] [address]  Blood Culture (routine x 2)     Status: None (Preliminary result)   Collection Time: 10/20/18  1:37 PM  Result Value Ref Range Status   Specimen Description BLOOD RIGHT HAND  Final   Special Requests   Final    BOTTLES DRAWN AEROBIC AND ANAEROBIC Blood Culture adequate volume   Culture   Final    NO GROWTH 2 DAYS Performed at [facility], [place] [address], [place], [place] [address]    Report Status PENDING  Incomplete  Aerobic/Anaerobic Culture (surgical/deep wound)     Status:  None (Preliminary result)   Collection Time: 10/20/18  8:37 PM  Result Value Ref Range Status   Specimen Description ABSCESS LEFT FOOT  Final   Special Requests NONE  Final   Gram Stain   Final    FEW WBC PRESENT, PREDOMINANTLY MONONUCLEAR RARE GRAM POSITIVE COCCI    Culture   Final    MODERATE STAPHYLOCOCCUS AUREUS NO ANAEROBES ISOLATED; CULTURE IN PROGRESS FOR 5 DAYS    Report Status PENDING  Incomplete   Organism ID, Bacteria STAPHYLOCOCCUS AUREUS  Final      Susceptibility   Staphylococcus aureus - MIC*    CIPROFLOXACIN <=0.5 SENSITIVE Sensitive     ERYTHROMYCIN <=0.25 SENSITIVE Sensitive     GENTAMICIN <=0.5 SENSITIVE Sensitive     OXACILLIN <=0.25 SENSITIVE Sensitive     TETRACYCLINE <=1 SENSITIVE Sensitive     VANCOMYCIN <=0.5 SENSITIVE Sensitive     TRIMETH/SULFA <=10 SENSITIVE Sensitive     CLINDAMYCIN <=0.25 SENSITIVE Sensitive     RIFAMPIN <=0.5 SENSITIVE Sensitive     Inducible Clindamycin Value in next row Sensitive      NEGATIVEPerformed at [place] [address] [place],  [address]    * MODERATE STAPHYLOCOCCUS AUREUS         Radiology Studies: Ct Abdomen Pelvis Wo Contrast  Result Date: 10/20/2018 CLINICAL DATA:  Nausea, vomiting and diarrhea for 4 days. Patient is on dialysis. EXAM: CT ABDOMEN AND PELVIS WITHOUT CONTRAST TECHNIQUE: Multidetector CT imaging of the abdomen and pelvis was performed  following the standard protocol without IV contrast. COMPARISON:  CT scan 07/30/2014 FINDINGS: Lower chest: Mild cardiac enlargement but no pericardial effusion. There are advanced three-vessel coronary artery calcifications for age. Hepatobiliary: No focal hepatic lesions or intrahepatic biliary dilatation. The gallbladder is grossly normal. No common bile duct dilatation. Pancreas: No mass, inflammation or ductal dilatation without contrast. Spleen: Normal size.  No focal lesions. Adrenals/Urinary Tract: The adrenal glands are normal. The kidneys demonstrate renal cortical thinning and atrophy. There are extensive renal artery calcifications due to diabetes. No hydronephrosis or renal calculi. The bladder is grossly normal. Stomach/Bowel: The stomach, duodenum, small bowel and colon are grossly normal without oral contrast. No obvious acute inflammatory changes, mass lesions or obstructive findings. The terminal ileum and appendix are normal. Vascular/Lymphatic: Scattered aortic calcifications but no aneurysm. The aortic branch vessels are calcified including the SMA and IMA. Small scattered mesenteric and retroperitoneal lymph nodes but no mass or overt adenopathy. Reproductive: The prostate gland and seminal vesicles are normal. Extensive calcifications of the vas deferens are noted. Other: Small bilateral inguinal lymph nodes likely inflammatory/hyperplastic. No pelvic mass or adenopathy. No free pelvic fluid collections. Musculoskeletal: No significant bony findings. IMPRESSION: 1. No acute abdominal/pelvic findings, mass lesions or adenopathy given limitation of no IV contrast or oral contrast. 2. Extensive small vessel disease due to diabetes. 3. Small kidneys with renal cortical atrophy. 4. Fairly extensive, age advanced, three-vessel coronary artery calcifications. Electronically Signed   By: Marijo Sanes M.D.   On: 10/20/2018 14:49   Dg Chest 1 View  Result Date: 10/20/2018 CLINICAL DATA:   Leukocytosis EXAM: CHEST  1 VIEW COMPARISON:  11/23/2017 FINDINGS: No acute opacity or pleural effusion. Stable cardiomediastinal silhouette. No pneumothorax. Upper extremity stents. IMPRESSION: No active disease. Electronically Signed   By: Donavan Foil M.D.   On: 10/20/2018 15:15   Dg Tibia/fibula Left  Result Date: 10/20/2018 CLINICAL DATA:  Leukocytosis.  Neuropathy. EXAM: LEFT TIBIA AND FIBULA - 2 VIEW  COMPARISON:  No recent prior. FINDINGS: No acute bony or joint abnormality identified. No evidence of fracture or dislocation. Peripheral vascular calcification. IMPRESSION: 1. No acute bony or joint abnormality. No evidence of fracture dislocation. 2.  Peripheral vascular disease. Electronically Signed   By: Marcello Moores  Register   On: 10/20/2018 15:13   Dg Foot Complete Left  Result Date: 10/20/2018 CLINICAL DATA:  Leukocytosis EXAM: LEFT FOOT - COMPLETE 3+ VIEW COMPARISON:  10/19/2018, 08/20/2018 FINDINGS: Status post resection of the fifth digit with small remnant base of fifth metatarsal. Frayed appearance at the cut surface of the metatarsal with patchy lucencies on the lateral view. Vascular calcifications. No soft tissue emphysema. IMPRESSION: Status post resection of the fifth digit with residual base of fifth metatarsal. As compared with 08/20/2018, there is indistinct appearance of the cut margin of the metatarsal with heterogeneous lucencies on the lateral view, raising concern for osteomyelitis. Electronically Signed   By: Donavan Foil M.D.   On: 10/20/2018 15:14        Scheduled Meds: . [START ON 10/24/2018] calcitRIOL  2.5 mcg Oral Q M,W,F-HD  . Chlorhexidine Gluconate Cloth  6 each Topical Q0600  . cinacalcet  30 mg Oral Q supper  . heparin  5,000 Units Subcutaneous Q8H  . insulin aspart  0-15 Units Subcutaneous TID WC  . insulin aspart  0-5 Units Subcutaneous QHS  . insulin aspart  3 Units Subcutaneous TID WC  . insulin glargine  15 Units Subcutaneous QHS  . sevelamer  carbonate  2,400 mg Oral BID WC  . sodium chloride flush  3 mL Intravenous Once   Continuous Infusions: . ceFEPime (MAXIPIME) IV Stopped (10/21/18 2110)  . famotidine (PEPCID) IV 20 mg (10/22/18 0953)  . metronidazole 500 mg (10/22/18 0817)  . vancomycin 1,000 mg (10/21/18 1440)     LOS: 2 days     Vernell Leep, MD, FACP, [facility]  To contact the attending provider between 7A-7P or the covering provider during after hours 7P-7A, please log into the web site [email or web address] and access using universal Circle password for that web site. If you do not have the password, please call the hospital operator.  10/22/2018, 1:10 PM

## 2018-10-22 NOTE — Progress Notes (Signed)
Patient BS 73. Patient noted to be confused. Patient unable to answer questions appropriately. Patient kept asking writer if he's going to dialysis. Patient offered grape juice. He c/o nausea and he vomited, emesis consistent with food from lunch. He stated he prefers apple juice and was able to tolerate. CBG rechecked 117. Patient is alert and back to his baseline. Will cont to monitor.

## 2018-10-22 NOTE — Progress Notes (Signed)
Pharmacy Antibiotic Note  [name] is a 38 y.o. male admitted on 10/20/2018 with Sepsis. Patient initially placed on vancomycin, cefepime, and flagyl for diabetic foot infection/abscess and cellulitis. Intraoperative cultures have now returned as pan-sensitive MSSA. Pharmacy has been consulted for cefazolin dosing. Patient is ESRD on HD MWF.   Plan: Cefazolin 2gm IV now and then 2gm after HD on MWF F/u dialysis schedule  Weight: 164 lb 0.4 oz (74.4 kg)  Temp (24hrs), Avg:98.2 F (36.8 C), Min:97.8 F (36.6 C), Max:98.4 F (36.9 C)  Recent Labs  Lab 10/20/18 1013 10/20/18 1242 10/20/18 2228 10/21/18 1022 10/22/18 1233  WBC 26.3*  --   --  22.7* 17.8*  CREATININE 8.82*  --   --  5.72* 8.14*  LATICACIDVEN  --  1.2 1.4  --   --     Estimated Creatinine Clearance: 11 mL/min (A) (by C-G formula based on SCr of 8.14 mg/dL (H)).    No Known Allergies  Antimicrobials this admission: Vanc 1/23>>1/25 Cefepime 1/23>>1/25 Flagyl 1/23>>1/25 Cefazolin 1/25>>   Microbiology results: 1/23 Stool cx >> 1/23 BCx >>NGTD 1/23 CDiff >> neg 1/23 L-foot abscess >> MSSA  Hevin Jeffcoat A. Levada Dy, PharmD, [place] Pager: [phone] Please utilize Amion for appropriate phone number to reach the unit pharmacist ([place])   10/22/2018 1:35 PM

## 2018-10-23 ENCOUNTER — Encounter (HOSPITAL_COMMUNITY)
Admission: EM | Disposition: A | Discharge status: Home Health | Payer: Self-pay | Source: Home / Self Care | Attending: Internal Medicine

## 2018-10-23 ENCOUNTER — Inpatient Hospital Stay (HOSPITAL_COMMUNITY): Payer: Medicare Other | Admitting: Certified Registered"

## 2018-10-23 ENCOUNTER — Encounter (HOSPITAL_COMMUNITY): Payer: Self-pay | Admitting: Certified Registered Nurse Anesthetist

## 2018-10-23 DIAGNOSIS — L97426 Non-pressure chronic ulcer of left heel and midfoot with bone involvement without evidence of necrosis: Secondary | ICD-10-CM

## 2018-10-23 DIAGNOSIS — L97523 Non-pressure chronic ulcer of other part of left foot with necrosis of muscle: Secondary | ICD-10-CM

## 2018-10-23 DIAGNOSIS — E08621 Diabetes mellitus due to underlying condition with foot ulcer: Secondary | ICD-10-CM

## 2018-10-23 DIAGNOSIS — L02612 Cutaneous abscess of left foot: Secondary | ICD-10-CM

## 2018-10-23 HISTORY — PX: IRRIGATION AND DEBRIDEMENT FOOT: SHX6602

## 2018-10-23 LAB — GLUCOSE, CAPILLARY
Glucose-Capillary: 424 mg/dL — ABNORMAL HIGH (ref 70–99)
Glucose-Capillary: 442 mg/dL — ABNORMAL HIGH (ref 70–99)
Glucose-Capillary: 346 mg/dL — ABNORMAL HIGH (ref 70–99)
Glucose-Capillary: 244 mg/dL — ABNORMAL HIGH (ref 70–99)
Glucose-Capillary: 259 mg/dL — ABNORMAL HIGH (ref 70–99)
Glucose-Capillary: 322 mg/dL — ABNORMAL HIGH (ref 70–99)
Glucose-Capillary: 208 mg/dL — ABNORMAL HIGH (ref 70–99)

## 2018-10-23 LAB — HEMOGLOBIN A1C
Hgb A1c MFr Bld: 6.7 % — ABNORMAL HIGH (ref 4.8–5.6)
Mean Plasma Glucose: 145.59 mg/dL

## 2018-10-23 SURGERY — IRRIGATION AND DEBRIDEMENT FOOT
Anesthesia: General | Site: Leg Lower | Laterality: Right

## 2018-10-23 MED ORDER — 0.9 % SODIUM CHLORIDE (POUR BTL) OPTIME
TOPICAL | Status: DC | PRN
  Administered 2018-10-23: 1000 mL

## 2018-10-23 MED ORDER — LIDOCAINE 2% (20 MG/ML) 5 ML SYRINGE
INTRAMUSCULAR | Status: AC
  Filled 2018-10-23: qty 5

## 2018-10-23 MED ORDER — INSULIN ASPART 100 UNIT/ML ~~LOC~~ SOLN
10.0000 [IU] | Freq: Once | SUBCUTANEOUS | Status: AC
  Administered 2018-10-23: 10 [IU] via SUBCUTANEOUS

## 2018-10-23 MED ORDER — RENA-VITE PO TABS
1.0000 | ORAL_TABLET | Freq: Every day | ORAL | Status: DC
  Administered 2018-10-23 – 2018-10-24 (×2): 1 via ORAL
  Filled 2018-10-23: qty 1

## 2018-10-23 MED ORDER — FENTANYL CITRATE (PF) 250 MCG/5ML IJ SOLN
INTRAMUSCULAR | Status: AC
  Filled 2018-10-23: qty 5

## 2018-10-23 MED ORDER — MIDAZOLAM HCL 2 MG/2ML IJ SOLN
INTRAMUSCULAR | Status: DC | PRN
  Administered 2018-10-23 (×2): 1 mg via INTRAVENOUS

## 2018-10-23 MED ORDER — HYDROMORPHONE HCL 1 MG/ML IJ SOLN
0.2500 mg | INTRAMUSCULAR | Status: DC | PRN
  Administered 2018-10-23 (×2): 0.5 mg via INTRAVENOUS

## 2018-10-23 MED ORDER — PROPOFOL 10 MG/ML IV BOLUS
INTRAVENOUS | Status: DC | PRN
  Administered 2018-10-23: 120 mg via INTRAVENOUS

## 2018-10-23 MED ORDER — SODIUM CHLORIDE 0.9 % IV SOLN
INTRAVENOUS | Status: DC | PRN
  Administered 2018-10-23: 10:00:00 via INTRAVENOUS

## 2018-10-23 MED ORDER — PROPOFOL 10 MG/ML IV BOLUS
INTRAVENOUS | Status: AC
  Filled 2018-10-23: qty 20

## 2018-10-23 MED ORDER — SEVELAMER CARBONATE 800 MG PO TABS
2400.0000 mg | ORAL_TABLET | Freq: Three times a day (TID) | ORAL | Status: DC
  Administered 2018-10-23 – 2018-10-25 (×4): 2400 mg via ORAL
  Filled 2018-10-23 (×5): qty 3

## 2018-10-23 MED ORDER — FENTANYL CITRATE (PF) 250 MCG/5ML IJ SOLN
INTRAMUSCULAR | Status: DC | PRN
  Administered 2018-10-23: 100 ug via INTRAVENOUS

## 2018-10-23 MED ORDER — SUCCINYLCHOLINE CHLORIDE 20 MG/ML IJ SOLN
INTRAMUSCULAR | Status: DC | PRN
  Administered 2018-10-23: 100 mg via INTRAVENOUS

## 2018-10-23 MED ORDER — VANCOMYCIN HCL 1000 MG IV SOLR
INTRAVENOUS | Status: AC
  Filled 2018-10-23: qty 1000

## 2018-10-23 MED ORDER — HYDROMORPHONE HCL 1 MG/ML IJ SOLN: 0.2500 mg | INTRAMUSCULAR | Status: DC | PRN

## 2018-10-23 MED ORDER — LIDOCAINE 2% (20 MG/ML) 5 ML SYRINGE
INTRAMUSCULAR | Status: DC | PRN
  Administered 2018-10-23: 60 mg via INTRAVENOUS

## 2018-10-23 MED ORDER — BUPIVACAINE HCL (PF) 0.5 % IJ SOLN
INTRAMUSCULAR | Status: DC | PRN
  Administered 2018-10-23: 10 mL

## 2018-10-23 MED ORDER — MIDAZOLAM HCL 2 MG/2ML IJ SOLN
INTRAMUSCULAR | Status: AC
  Filled 2018-10-23: qty 2

## 2018-10-23 MED ORDER — SODIUM CHLORIDE 0.9 % IR SOLN
Status: DC | PRN
  Administered 2018-10-23: 1000 mL
  Administered 2018-10-23: 3000 mL

## 2018-10-23 MED ORDER — ONDANSETRON HCL 4 MG/2ML IJ SOLN
INTRAMUSCULAR | Status: DC | PRN
  Administered 2018-10-23: 4 mg via INTRAVENOUS

## 2018-10-23 MED ORDER — INSULIN ASPART 100 UNIT/ML ~~LOC~~ SOLN
5.0000 [IU] | Freq: Once | SUBCUTANEOUS | Status: AC
  Administered 2018-10-23: 5 [IU] via SUBCUTANEOUS

## 2018-10-23 MED ORDER — HYDROMORPHONE HCL 1 MG/ML IJ SOLN
INTRAMUSCULAR | Status: AC
  Filled 2018-10-23: qty 1

## 2018-10-23 MED ORDER — MENTHOL 3 MG MT LOZG
1.0000 | LOZENGE | OROMUCOSAL | Status: DC | PRN
  Filled 2018-10-23: qty 9

## 2018-10-23 MED ORDER — HYDRALAZINE HCL 20 MG/ML IJ SOLN
10.0000 mg | Freq: Four times a day (QID) | INTRAMUSCULAR | Status: DC | PRN
  Administered 2018-10-23: 10 mg via INTRAVENOUS
  Filled 2018-10-23: qty 1

## 2018-10-23 MED ORDER — BUPIVACAINE HCL (PF) 0.5 % IJ SOLN
INTRAMUSCULAR | Status: AC
  Filled 2018-10-23: qty 30

## 2018-10-23 SURGICAL SUPPLY — 43 items
BANDAGE ACE 4X5 VEL STRL LF (GAUZE/BANDAGES/DRESSINGS) IMPLANT
BNDG ESMARK 4X9 LF (GAUZE/BANDAGES/DRESSINGS) IMPLANT
BNDG GAUZE ELAST 4 BULKY (GAUZE/BANDAGES/DRESSINGS) ×3 IMPLANT
CHLORAPREP W/TINT 26ML (MISCELLANEOUS) ×3 IMPLANT
COVER SURGICAL LIGHT HANDLE (MISCELLANEOUS) ×3 IMPLANT
COVER WAND RF STERILE (DRAPES) ×3 IMPLANT
CUFF TOURNIQUET SINGLE 18IN (TOURNIQUET CUFF) IMPLANT
CUFF TOURNIQUET SINGLE 34IN LL (TOURNIQUET CUFF) IMPLANT
DRAPE U-SHAPE 47X51 STRL (DRAPES) ×3 IMPLANT
DRESSING ADAPTIC 1/2  N-ADH (PACKING) ×3 IMPLANT
ELECT CAUTERY BLADE 6.4 (BLADE) ×3 IMPLANT
ELECT REM PT RETURN 9FT ADLT (ELECTROSURGICAL) ×3
ELECTRODE REM PT RTRN 9FT ADLT (ELECTROSURGICAL) ×1 IMPLANT
GAUZE PACKING IODOFORM 1/2 (PACKING) ×3 IMPLANT
GAUZE SPONGE 4X4 12PLY STRL (GAUZE/BANDAGES/DRESSINGS) IMPLANT
GAUZE SPONGE 4X4 16PLY XRAY LF (GAUZE/BANDAGES/DRESSINGS) ×3 IMPLANT
GAUZE XEROFORM 1X8 LF (GAUZE/BANDAGES/DRESSINGS) ×3 IMPLANT
GLOVE BIO SURGEON STRL SZ7.5 (GLOVE) ×3 IMPLANT
GLOVE BIOGEL PI IND STRL 8 (GLOVE) ×1 IMPLANT
GLOVE BIOGEL PI INDICATOR 8 (GLOVE) ×2
GOWN STRL REUS W/ TWL LRG LVL3 (GOWN DISPOSABLE) ×1 IMPLANT
GOWN STRL REUS W/ TWL XL LVL3 (GOWN DISPOSABLE) ×1 IMPLANT
GOWN STRL REUS W/TWL LRG LVL3 (GOWN DISPOSABLE) ×2
GOWN STRL REUS W/TWL XL LVL3 (GOWN DISPOSABLE) ×2
KIT BASIN OR (CUSTOM PROCEDURE TRAY) ×3 IMPLANT
KIT TURNOVER KIT B (KITS) ×3 IMPLANT
MANIFOLD NEPTUNE II (INSTRUMENTS) ×3 IMPLANT
NEEDLE BIOPSY JAMSHIDI 8X6 (NEEDLE) IMPLANT
NEEDLE HYPO 25GX1X1/2 BEV (NEEDLE) IMPLANT
NS IRRIG 1000ML POUR BTL (IV SOLUTION) ×3 IMPLANT
PACK ORTHO EXTREMITY (CUSTOM PROCEDURE TRAY) ×3 IMPLANT
PAD ARMBOARD 7.5X6 YLW CONV (MISCELLANEOUS) ×6 IMPLANT
PROBE DEBRIDE SONICVAC MISONIX (TIP) IMPLANT
SCRUB BETADINE 4OZ XXX (MISCELLANEOUS) ×3 IMPLANT
SET CYSTO W/LG BORE CLAMP LF (SET/KITS/TRAYS/PACK) ×3 IMPLANT
SOL PREP POV-IOD 4OZ 10% (MISCELLANEOUS) ×3 IMPLANT
SUT VIC AB 3-0 FS2 27 (SUTURE) ×3 IMPLANT
SYR CONTROL 10ML LL (SYRINGE) IMPLANT
TOWEL GREEN STERILE (TOWEL DISPOSABLE) ×3 IMPLANT
TOWEL GREEN STERILE FF (TOWEL DISPOSABLE) ×3 IMPLANT
TUBE CONNECTING 12'X1/4 (SUCTIONS) ×1
TUBE CONNECTING 12X1/4 (SUCTIONS) ×2 IMPLANT
YANKAUER SUCT BULB TIP NO VENT (SUCTIONS) ×3 IMPLANT

## 2018-10-23 NOTE — Plan of Care (Signed)
Went to the floor to talk to the patient about the results of today's procedure.  Upon entrance to the room patient was yelling at dietary over the phone for his lunch order was incorrect.  I left the room for several minutes to allow for the patient to calm down.  Upon returning patient continued to complain about his food until I interrupted him to discuss his foot.  Discussed that due to the findings today of extensive soft tissue loss I do think he is ultimately going to experience amputation of the great toe.  Patient is adamantly against this.  I stated that in absence of proceeding with amputation he would need a wound VAC to attempt to cover the tissue in attempt to salvage the toe.  At first he was against this but I was ultimately able to convince the patient that we can proceed with this.   He states that he has wants nothing to do with managing his wound and he has to have a nurse that we will do everything.  I did place an order for home health care for home wound VAC therapy however we have had difficulty obtaining and keeping a home health care agency for him in the past.  Also of note patient states that he has no intention of wearingthe diabetic shoes that we were ordering in the office to protect his feet . He states that he was just "going through the motions" because we suggested them but he is going to put the ones we ordered in his closet and wear his regular shoes once he is completely healed on his left foot.  I reiterated the importance of a diabetic shoes once he eventually gets them.   He is frustrated with regards to everything that is going on with him with regards to his diabetes, dialysis and his recurrent foot issues.  His sugars remain wildly uncontrolled during his hospitalization due to noncompliance from his diet.I remain concerned that he does not know how to care for himself and his attitude towards his health is going to prevent good health outcomes, particularly with  my attempt to salvage his foot.  At this point there is no urgent need to amputate the digit as there is no signs of bone infection or osteomyelitis. For now we will proceed with trial of salvaging his great toe with a wound VAC however I predict he will ultimately end up with amputation of the digit at some point.  Evelina Bucy, DPM

## 2018-10-23 NOTE — Transfer of Care (Signed)
Immediate Anesthesia Transfer of Care Note  Patient: [name]  Procedure(s) Performed: Irrigation and Debridement to tendon, Left Foot (Right Leg Lower)  Patient Location: PACU  Anesthesia Type:General  Level of Consciousness: drowsy and patient cooperative  Airway & Oxygen Therapy: Patient Spontanous Breathing  Post-op Assessment: Report given to RN, Post -op Vital signs reviewed and stable and Patient moving all extremities X 4  Post vital signs: Reviewed and stable  Last Vitals:  Vitals Value Taken Time  BP 133/93 10/23/2018 10:35 AM  Temp    Pulse 80 10/23/2018 10:36 AM  Resp 9 10/23/2018 10:36 AM  SpO2 100 % 10/23/2018 10:36 AM  Vitals shown include unvalidated device data.  Last Pain:  Vitals:   10/23/18 0800  TempSrc:   PainSc: 0-No pain      Patients Stated Pain Goal: 2 (92/34/14 4360)  Complications: No apparent anesthesia complications

## 2018-10-23 NOTE — OR Nursing (Signed)
CBG 259, Dr. Ola Spurr notified, no new orders received will continue to monitor.

## 2018-10-23 NOTE — Anesthesia Postprocedure Evaluation (Signed)
Anesthesia Post Note  Patient: [name]  Procedure(s) Performed: Irrigation and Debridement to tendon, Left Foot (Right Leg Lower)     Patient location during evaluation: PACU Anesthesia Type: General Level of consciousness: awake and alert Pain management: pain level controlled Vital Signs Assessment: post-procedure vital signs reviewed and stable Respiratory status: spontaneous breathing, nonlabored ventilation and respiratory function stable Cardiovascular status: blood pressure returned to baseline and stable Postop Assessment: no apparent nausea or vomiting Anesthetic complications: no    Last Vitals:  Vitals:   10/23/18 1045 10/23/18 1100  BP:  (!) 148/97  Pulse: 83 81  Resp: 12 (!) 0  Temp:  36.6 C  SpO2: 100% 98%    Last Pain:  Vitals:   10/23/18 1100  TempSrc:   PainSc: 5                  Lajuan Godbee,W. EDMOND

## 2018-10-23 NOTE — Progress Notes (Signed)
At approximately 2130 RN summoned to patient's room by NT stating that the patient wanted the RN to find out what happened to the food he ordered for dinner from the cafeteria. RN went to patient's room to find out more details about what happened.The patient explained to the RN that he ordered food from the cafeteria and he never got the food.   RN apologized for the delay in receiving food and informed the patient that the cafeteria was already closed for the night and offered to prepare him a frozen dinner and or a selection of snacks. Patient became upset and stated " I don't want that (profane word)." "I want what the (profane word) I ordered." Patient then stated that he wanted a meal voucher in order to buy a sandwich combo from the [employer]. RN told the patient she would return with an update related to his meal.   RN went to speak with the charge nurse regarding the matter. During the discussion with the charge nurse this RN discovered that the [employer] was closed for the night. RN returned to the patients room and shared with him that the restaurant is closed and offered a frozen dinner meal or a selection of snacks again. Patient became more upset and demanded to speak to the charge nurse.   The charge nurse reported to the bedside. This RN accompanied the charge nurse at the bedside. The patient audio recorded the entire conversation without consent of the charge nurse or this RN. The charge nurse explained to the patient in detail the possible situations which may have occurred related to as to why he did not receive his dinner from the cafeteria and that the restaurant is closed and therefore no meal voucher could be given. Patient cursing loudly at the charge nurse stated that he wanted his food and he was ready to "jump on someone and mess them up." Charge nurse reassured the patient that the situation would be resolved and promised to return to the patient's  room.  Charge nurse and this RN left the room. Charge nurse was able to contact the [facility] to request a sandwich meal for the patient. Once the meal was received from the [facility] the charge nurse took the meal to the patient. The patient was thankful. No further issues related to his diet transpired after this occurrence.

## 2018-10-23 NOTE — Progress Notes (Signed)
Patient's blood sugar 424. Notified MD per order.

## 2018-10-23 NOTE — Anesthesia Preprocedure Evaluation (Addendum)
Anesthesia Evaluation  Patient identified by MRN, date of birth, ID band Patient awake    Reviewed: Allergy & Precautions, H&P , NPO status , Patient's Chart, lab work & pertinent test results  Airway Mallampati: II  TM Distance: >3 FB Neck ROM: Full    Dental no notable dental hx. (+) Teeth Intact, Dental Advisory Given   Pulmonary neg pulmonary ROS,    Pulmonary exam normal breath sounds clear to auscultation       Cardiovascular hypertension,  Rhythm:Regular Rate:Normal     Neuro/Psych negative neurological ROS  negative psych ROS   GI/Hepatic negative GI ROS, Neg liver ROS,   Endo/Other  diabetes, Type 1, Insulin Dependent  Renal/GU Dialysis and ESRFRenal disease  negative genitourinary   Musculoskeletal   Abdominal   Peds  Hematology  (+) Blood dyscrasia, anemia ,   Anesthesia Other Findings   Reproductive/Obstetrics negative OB ROS                            Anesthesia Physical Anesthesia Plan  ASA: III  Anesthesia Plan: General   Post-op Pain Management:    Induction: Rapid sequence, Cricoid pressure planned and Intravenous  PONV Risk Score and Plan: 3 and Ondansetron, Midazolam and Treatment may vary due to age or medical condition  Airway Management Planned: Oral ETT  Additional Equipment:   Intra-op Plan:   Post-operative Plan: Extubation in OR  Informed Consent: I have reviewed the patients History and Physical, chart, labs and discussed the procedure including the risks, benefits and alternatives for the proposed anesthesia with the patient or authorized representative who has indicated his/her understanding and acceptance.     Dental advisory given  Plan Discussed with: CRNA  Anesthesia Plan Comments:         Anesthesia Quick Evaluation

## 2018-10-23 NOTE — Progress Notes (Signed)
PROGRESS NOTE   [name]  ZOX:[id number]    DOB: Sep 21, 1981    DOA: 10/20/2018  PCP: No primary care provider on file.   I have briefly reviewed patients previous medical [facility] in [facility].  Brief Narrative:  38 year old single male, lives with his mother, independent, extensive PMH: Type I DM with peripheral neuropathy, diabetic gastroparesis, ESRD on MWF HD, multiple lower extremity amputations, essential HTN, presented to the [facility] ED on 10/20/2018 due to intractable nausea, vomiting, diarrhea, swelling of left foot around big toe with drainage and fevers.  Admitted for intractable nausea, vomiting, diarrhea, sepsis due to left diabetic foot infection with abscess and cellulitis.  Podiatry performed I&D in the OR on night of admission.  Nephrology consulted for dialysis needs.  Improving.  Going to the OR on 1/26 for second I&D.  Widely fluctuating blood sugars due to patient's noncompliance with diet.   Assessment & Plan:   Principal Problem:   Diabetic foot infection ([place]) Active Problems:   Diabetes mellitus type 1 ([place])   Diabetic gastroparesis ([place])   ESRD (end stage renal disease) on dialysis ([place])   HTN (hypertension)   Sepsis ([place])   Cellulitis and abscess of foot   Nausea vomiting and diarrhea   Septic arthritis of left foot ([place])   DM (diabetes mellitus), secondary, uncontrolled, with neurologic complications ([place])   Assessment and plan:   1. Intractable nausea, vomiting and diarrhea: DD: Acute viral GE, diabetic gastroparesis flare, related to sepsis from diabetic foot infection versus other etiologies.  C. difficile testing and GI panel PCR negative.  Abdominal CT without contrast without acute findings.  Treated supportively with bowel rest, gentle IV fluids, IV Pepcid, PRN antiemetics. Does have chronic nausea.   Resolved.  Outpatient follow-up with GI regarding chronic fecal incontinence. 2. Sepsis due to left diabetic foot infection/abscess and  cellulitis: Concern for abscess/cellulitis around left great toe and medial forefoot.  Not sure if patient has osteomyelitis of left fifth metatarsal.  Met sepsis criteria on admission.  Received gentle IV fluids.  Empirically started IV vancomycin, cefepime and metronidazole.  Podiatry/Dr. March Rummage consulted and performed extensive I&D in OR on night of admission.  Blood cultures x2: Negative to date.  Abscess culture shows pansensitive/MSSA.  Discontinued vancomycin, cefepime and metronidazole and started IV cefazolin.  Possibly Keflex or doxycycline at discharge.  Sepsis resolved.  As per Dr. March Rummage follow-up, plan repeat exploration in OR on 1/26 and potential discharge home on 1/27.  Patient seen this morning prior to procedure. 3. Left diabetic foot infection:  Management as per problem #2.  Improving. 4. Uncontrolled type I DM with peripheral neuropathy and renal complications: Placed on reduced dose of Lantus 10 units at bedtime and sensitive SSI.  Monitor closely and adjust insulins as needed.  Clinically does not appear to be in DKA.  Elevated anion gap likely related to ESRD.  Uncontrolled and fluctuating.  Patient insists on regular diet and not diabetic diet.  Poorly controlled DM due to extreme noncompliance with diet despite extensive counseling.  CBGs up to 534 this morning.  Patient eats whatever he wants.  Adjusted insulins.  Increased Lantus to 20 units at bedtime, changed SSI to moderate sensitivity and added mealtime NovoLog 3 units.  Monitor and adjust insulins as needed.  A1c in November was 7.3, will repeat.  Had hypoglycemia 1/25 afternoon.  His CBG threshold for hypoglycemia is expectedly higher than usual.  CBG this morning in the 400s prior to procedure.  Received NovoLog 10+5 units immediately prior to procedure.  As discussed with RN, monitor CBGs q. hourly while in OR. 5. ESRD on MWF HD: Last dialyzed on 1/22 & 1/24.  Next HD on 1/27. 6. Essential hypertension: Not on antihypertensives  at home.  Controlled. 7. Leukocytosis: Secondary to sepsis.    Improving. 8. Anemia: Stable.    DVT prophylaxis: Subcutaneous heparin Code Status: Full Family Communication: None at bedside Disposition: DC home pending clinical improvement, hopefully 1/27 after dialysis.   Consultants:  Podiatry//Dr. Hardie Pulley Nephrology   Procedures:  I&D of left foot abscess in OR 1/23 HD on 1/24  Antimicrobials:  Discontinued IV vancomycin, cefepime and metronidazole. IV cefazolin 1/25 >   Subjective: Staff waiting to take him to the OR this morning.  No new complaints reported.  ROS: As above.  Objective:  Vitals:   10/21/18 2053 10/22/18 0336 10/22/18 2020 10/23/18 0551  BP: (!) 128/92 (!) 151/86 135/76 (!) 133/91  Pulse: 96 100 88 99  Resp:      Temp: 98.4 F (36.9 C) 98.3 F (36.8 C) 98.4 F (36.9 C) 98.6 F (37 C)  TempSrc: Oral Oral Oral Oral  SpO2: 98% 100% 100%   Weight:        Examination:  General exam: Pleasant young male, moderately built and nourished lying comfortably propped up in bed.  Oral mucosa moist. Respiratory system: Clear to auscultation.  No increased work of breathing.   Cardiovascular system: S1 & S2 heard, RRR. No JVD, murmurs, rubs, gallops or clicks. No pedal edema. Gastrointestinal system: Abdomen is nondistended, soft and nontender. No organomegaly or masses felt. Normal bowel sounds heard. Central nervous system: Alert and oriented. No focal neurological deficits. Extremities: Symmetric 5 x 5 power. Skin: Multiple tattoos over her body.  Left foot and ankle postop dressing clean and dry. Psychiatry: Judgement and insight appear normal. Mood & affect appropriate.     Data Reviewed: I have personally reviewed following labs and imaging studies  CBC: Recent Labs  Lab 10/20/18 1013 10/20/18 1350 10/21/18 1022 10/22/18 1233  WBC 26.3*  --  22.7* 17.8*  HGB 12.8* 16.3 11.5* 11.4*  HCT 41.1 48.0 36.1* 36.6*  MCV 95.1  --  93.5  93.4  PLT 166  --  151 076   Basic Metabolic Panel: Recent Labs  Lab 10/20/18 1013 10/20/18 1350 10/21/18 1022 10/22/18 1233  NA 130* 127* 132* 130*  K 4.3 4.5 3.0* 3.3*  CL 89*  --  94* 89*  CO2 25  --  21* 26  GLUCOSE 210*  --  250* 141*  BUN 31*  --  21* 35*  CREATININE 8.82*  --  5.72* 8.14*  CALCIUM 9.4  --  8.6* 8.9   Liver Function Tests: Recent Labs  Lab 10/20/18 1013  AST 23  ALT 14  ALKPHOS 162*  BILITOT 1.3*  PROT 7.5  ALBUMIN 2.8*   CBG: Recent Labs  Lab 10/22/18 1633 10/22/18 1711 10/22/18 1839 10/22/18 2117 10/23/18 0547  GLUCAP 73 83 117* 265* 424*    Recent Results (from the past 240 hour(s))  WOUND CULTURE     Status: Abnormal   Collection Time: 10/19/18  3:15 PM  Result Value Ref Range Status   MICRO NUMBER: [id number]  Final   SPECIMEN QUALITY: Adequate  Final   SOURCE: LEFT FOOT LATERAL  Final   STATUS: FINAL  Final   GRAM STAIN:   Final    No white blood cells seen No epithelial  cells seen Few Gram positive cocci in pairs   ISOLATE 1: Staphylococcus aureus (A)  Final    Comment: Heavy growth of Staphylococcus aureus      Susceptibility   Staphylococcus aureus - AEROBIC CULT, GRAM STAIN POSITIVE 1    VANCOMYCIN <=0.5 Sensitive     CIPROFLOXACIN <=0.5 Sensitive     CLINDAMYCIN <=0.25 Sensitive     LEVOFLOXACIN <=0.12 Sensitive     ERYTHROMYCIN <=0.25 Sensitive     GENTAMICIN <=0.5 Sensitive     OXACILLIN* <=0.25 Sensitive      * Oxacillin-susceptible staphylococci aresusceptible to other penicillinase-stablepenicillins (e.g. Methicillin, Nafcillin), beta-lactam/beta-lactamase inhibitor combinations, andcephems with staphylococcal indications, includingCefazolin.    TETRACYCLINE <=1 Sensitive     TRIMETH/SULFA* <=10 Sensitive      * Oxacillin-susceptible staphylococci aresusceptible to other penicillinase-stablepenicillins (e.g. Methicillin, Nafcillin), beta-lactam/beta-lactamase inhibitor combinations, andcephems with staphylococcal  indications, includingCefazolin.Legend:S = Susceptible  I = IntermediateR = Resistant  NS = Not susceptible* = Not tested  NR = Not reported**NN = See antimicrobic comments  Blood Culture (routine x 2)     Status: None (Preliminary result)   Collection Time: 10/20/18 12:13 PM  Result Value Ref Range Status   Specimen Description BLOOD RIGHT ANTECUBITAL  Final   Special Requests   Final    BOTTLES DRAWN AEROBIC AND ANAEROBIC Blood Culture adequate volume   Culture   Final    NO GROWTH 2 DAYS Performed at [facility], [place] [address] [place], [place] [address]    Report Status PENDING  Incomplete  Gastrointestinal Panel by PCR , Stool     Status: None   Collection Time: 10/20/18 12:26 PM  Result Value Ref Range Status   Campylobacter species NOT DETECTED NOT DETECTED Final   Plesimonas shigelloides NOT DETECTED NOT DETECTED Final   Salmonella species NOT DETECTED NOT DETECTED Final   Yersinia enterocolitica NOT DETECTED NOT DETECTED Final   Vibrio species NOT DETECTED NOT DETECTED Final   Vibrio cholerae NOT DETECTED NOT DETECTED Final   Enteroaggregative E coli (EAEC) NOT DETECTED NOT DETECTED Final   Enteropathogenic E coli (EPEC) NOT DETECTED NOT DETECTED Final   Enterotoxigenic E coli (ETEC) NOT DETECTED NOT DETECTED Final   Shiga like toxin producing E coli (STEC) NOT DETECTED NOT DETECTED Final   Shigella/Enteroinvasive E coli (EIEC) NOT DETECTED NOT DETECTED Final   Cryptosporidium NOT DETECTED NOT DETECTED Final   Cyclospora cayetanensis NOT DETECTED NOT DETECTED Final   Entamoeba histolytica NOT DETECTED NOT DETECTED Final   Giardia lamblia NOT DETECTED NOT DETECTED Final   Adenovirus F40/41 NOT DETECTED NOT DETECTED Final   Astrovirus NOT DETECTED NOT DETECTED Final   Norovirus GI/GII NOT DETECTED NOT DETECTED Final   Rotavirus A NOT DETECTED NOT DETECTED Final   Sapovirus (I, II, IV, and V) NOT DETECTED NOT DETECTED Final    Comment: Performed at [facility], [place] [place], [place] [address]  C difficile quick scan w PCR reflex     Status: None   Collection Time: 10/20/18 12:26 PM  Result Value Ref Range Status   C Diff antigen NEGATIVE NEGATIVE Final   C Diff toxin NEGATIVE NEGATIVE Final   C Diff interpretation No C. difficile detected.  Final    Comment: Performed at [facility], [place] [address], [place], [place] [address]  Blood Culture (routine x 2)     Status: None (Preliminary result)   Collection Time: 10/20/18  1:37 PM  Result Value Ref  Range Status   Specimen Description BLOOD RIGHT HAND  Final   Special Requests   Final    BOTTLES DRAWN AEROBIC AND ANAEROBIC Blood Culture adequate volume   Culture   Final    NO GROWTH 2 DAYS Performed at [facility], [address], [place], [place] [address]    Report Status PENDING  Incomplete  Aerobic/Anaerobic Culture (surgical/deep wound)     Status: None (Preliminary result)   Collection Time: 10/20/18  8:37 PM  Result Value Ref Range Status   Specimen Description ABSCESS LEFT FOOT  Final   Special Requests NONE  Final   Gram Stain   Final    FEW WBC PRESENT, PREDOMINANTLY MONONUCLEAR RARE GRAM POSITIVE COCCI    Culture   Final    MODERATE STAPHYLOCOCCUS AUREUS NO ANAEROBES ISOLATED; CULTURE IN PROGRESS FOR 5 DAYS    Report Status PENDING  Incomplete   Organism ID, Bacteria STAPHYLOCOCCUS AUREUS  Final      Susceptibility   Staphylococcus aureus - MIC*    CIPROFLOXACIN <=0.5 SENSITIVE Sensitive     ERYTHROMYCIN <=0.25 SENSITIVE Sensitive     GENTAMICIN <=0.5 SENSITIVE Sensitive     OXACILLIN <=0.25 SENSITIVE Sensitive     TETRACYCLINE <=1 SENSITIVE Sensitive     VANCOMYCIN <=0.5 SENSITIVE Sensitive     TRIMETH/SULFA <=10 SENSITIVE Sensitive     CLINDAMYCIN <=0.25 SENSITIVE Sensitive     RIFAMPIN <=0.5 SENSITIVE Sensitive     Inducible Clindamycin Value in next row Sensitive      NEGATIVEPerformed at [facility] [address]  [place], [place] [address]    * MODERATE STAPHYLOCOCCUS AUREUS         Radiology Studies: No results found.      Scheduled Meds: . [START ON 10/24/2018] calcitRIOL  2.5 mcg Oral Q M,W,F-HD  . Chlorhexidine Gluconate Cloth  6 each Topical Q0600  . cinacalcet  30 mg Oral Q supper  . heparin  5,000 Units Subcutaneous Q8H  . insulin aspart  0-15 Units Subcutaneous TID WC  . insulin aspart  3 Units Subcutaneous TID WC  . insulin glargine  15 Units Subcutaneous QHS  . sevelamer carbonate  2,400 mg Oral BID WC  . sodium chloride flush  3 mL Intravenous Once   Continuous Infusions: . [START ON 10/24/2018]  ceFAZolin (ANCEF) IV    . famotidine (PEPCID) IV 20 mg (10/22/18 0953)     LOS: 3 days     Vernell Leep, MD, FACP, [facility]. [facility]  To contact the attending provider between 7A-7P or the covering provider during after hours 7P-7A, please log into the web site [email or web address] and access using universal [place] password for that web site. If you do not have the password, please call the hospital operator.  10/23/2018, 7:50 AM

## 2018-10-23 NOTE — Progress Notes (Signed)
Pt BS 442, MD notified. Order to give 5 units now. Report called in to OR.

## 2018-10-23 NOTE — Progress Notes (Signed)
Notified MD of blood pressure 149/106.

## 2018-10-23 NOTE — Plan of Care (Signed)
  Problem: Education: Goal: Knowledge of General Education information will improve Description: Including pain rating scale, medication(s)/side effects and non-pharmacologic comfort measures Outcome: Progressing   Problem: Clinical Measurements: Goal: Ability to maintain clinical measurements within normal limits will improve Outcome: Progressing   Problem: Activity: Goal: Risk for activity intolerance will decrease Outcome: Progressing   Problem: Safety: Goal: Ability to remain free from injury will improve Outcome: Progressing   Problem: Skin Integrity: Goal: Risk for impaired skin integrity will decrease Outcome: Progressing   

## 2018-10-23 NOTE — Anesthesia Procedure Notes (Signed)
Procedure Name: Intubation Date/Time: 10/23/2018 10:00 AM Performed by: Julieta Bellini, CRNA Pre-anesthesia Checklist: Patient identified, Emergency Drugs available, Suction available and Patient being monitored Patient Re-evaluated:Patient Re-evaluated prior to induction Oxygen Delivery Method: Circle system utilized Preoxygenation: Pre-oxygenation with 100% oxygen Induction Type: IV induction, Rapid sequence and Cricoid Pressure applied Laryngoscope Size: Mac and 4 Grade View: Grade I Tube type: Oral Tube size: 7.5 mm Number of attempts: 1 Airway Equipment and Method: Stylet Placement Confirmation: ETT inserted through vocal cords under direct vision,  positive ETCO2 and breath sounds checked- equal and bilateral Secured at: 22 cm Tube secured with: Tape Dental Injury: Teeth and Oropharynx as per pre-operative assessment

## 2018-10-23 NOTE — Op Note (Signed)
Patient Name: [name] DOB: March 27, 1981  MRN: [id number]   Date of Service: 10/23/2018   Surgeon: Dr. Hardie Pulley, DPM Assistants: None Pre-operative Diagnosis: Abscess of Left Foot, Skin Necrosis Post-operative Diagnosis: Same Procedures:             1) Incision and Drainage below the Deep Fascia, Complicated Pathology/Specimens: * No specimens in log * Anesthesia: General Hemostasis: Anatomic Estimated Blood Loss: 5 ml Materials: None Medications: 10 ml 0.5% Marcaine Plain Complications: None  Indications for Procedure:  This is a 38 y.o. male who presented with an abscess to the left great toe. He underwent I&D of the abscess and clinically has been improving. He presents for repeat washout today.   Procedure in Detail: Patient was identified in pre-operative holding area. Formal consent was signed and the left lower extremity was marked. Patient was brought back to the operating room and placed on the operating room table in the supine position. Anesthesia was induced.   The extremity was prepped and draped in the usual sterile fashion. Timeout was taken to confirm patient name, laterality, and procedure prior to incision. Attention was then directed to the left foot at the area that was previously incised. The area was compressed and there was continued purulence. For this reason, repeat incision and drainage was performed. The fluctuant area was more extensively incised with a 15 blade. Purulence was released. Skin necrosis was more extensive today. This was sharply excised.   Attention was then directed to the plantar aspect of the toe. Incision was made and purulence again released. Non-viable tissue was debrided with a rongeur. Tissue was debrided down to the level of exposed tendon.  The resultant wound measured 5x7.5x2 with significant tissue loss and with exposed tendon.  The foot was then dressed with xeroform, iodoform packing, kerlix, and tape. Patient tolerated  the procedure well.   Disposition: Following a period of post-operative monitoring, patient will be transferred back to the floor.  I am doubtful the toe will prove salvageable due to patient's co-morbidities and the amount of tissue loss. His options will include amputation vs prolonged wound VAC and likely later skin grafting.   Will discuss this with the patient and proceed accordingly. He will not have to stay in the hospital for this, we can continue to pack the wound daily.

## 2018-10-23 NOTE — Progress Notes (Addendum)
Subjective:  Upset about diet, sp Dr March Rummage today = Incision and Drainage below the Deep Fascia ,Fro hd tomorrow  On schedule   Objective Vital signs in last 24 hours: Vitals:   10/23/18 0551 10/23/18 1030 10/23/18 1045 10/23/18 1100  BP: (!) 133/91 (!) 133/93  (!) 148/97  Pulse: 99 82 83 81  Resp:  (!) 7 12 (!) 0  Temp: 98.6 F (37 C) 97.9 F (36.6 C)  97.9 F (36.6 C)  TempSrc: Oral     SpO2:  98% 100% 98%  Weight:       Weight change:   Physical Exam: General: Alert, NAD , Heart: RRR , no m,r,g Lungs: CTA bilat  Abdomen: BS pos, soft, NT, ND Extremities: No pedal edema, R Foot sp Toe amps, L foot dressing  Dialysis Access: LUA AVF + bruit   Home meds: - insulin aspart SSI ac/ insulin glargine 10u hs - sevelamer carb 3-4 ac tid/ cinacalcet 30 hs - prn's  OP Dialysis:MWF [facility] 4h 75.5kg Hep 8000 2/2 bath LUA AVG  - calcitriol 2.5 ug tiw - venofer 100mg  x 7  Problem/Plan: 1. Diabetic Foot Infection SP I&D Dr March Rummage 10/21/18 ,IV abx, for repeat I&D today  2. ESRD - MWF on schedule , k 3.3  eating well fu k am labs pre hd  3. Anemia - HGB 11.5 >11.4 no esa needs, Hold iron with infection  4. Secondary hyperparathyroidism - calcitriol on hd , sensipar  Ca 9.9 corec , check phos  With hd labs tomor / envela binder  5. HTN/volume - no excess volume on exam or admit , no home bp meds , slightly below edw , stated  He can stand for wts.(if allowed by Dr Price)/ uf 2 l tomor  Hd  6. IDDM type 1   [name], PA-C [name] Beeper [phone] 10/23/2018,4:19 PM  LOS: 3 days   Pt seen, examined and agree w A/P as above.  Kelly Splinter MD [name] pager [phone]   10/23/2018, 4:52 PM    Labs: Basic Metabolic Panel: Recent Labs  Lab 10/20/18 1013 10/20/18 1350 10/21/18 1022 10/22/18 1233  NA 130* 127* 132* 130*  K 4.3 4.5 3.0* 3.3*  CL 89*  --  94* 89*  CO2 25  --  21* 26  GLUCOSE 210*  --  250* 141*  BUN 31*  --   21* 35*  CREATININE 8.82*  --  5.72* 8.14*  CALCIUM 9.4  --  8.6* 8.9   Liver Function Tests: Recent Labs  Lab 10/20/18 1013  AST 23  ALT 14  ALKPHOS 162*  BILITOT 1.3*  PROT 7.5  ALBUMIN 2.8*   Recent Labs  Lab 10/20/18 1013  LIPASE 28   No results for input(s): AMMONIA in the last 168 hours. CBC: Recent Labs  Lab 10/20/18 1013 10/20/18 1350 10/21/18 1022 10/22/18 1233  WBC 26.3*  --  22.7* 17.8*  HGB 12.8* 16.3 11.5* 11.4*  HCT 41.1 48.0 36.1* 36.6*  MCV 95.1  --  93.5 93.4  PLT 166  --  151 191   Cardiac Enzymes: No results for input(s): CKTOTAL, CKMB, CKMBINDEX, TROPONINI in the last 168 hours. CBG: Recent Labs  Lab 10/23/18 0547 10/23/18 0734 10/23/18 0915 10/23/18 1036 10/23/18 1126  GLUCAP 424* 442* 346* 259* 244*    Studies/Results: No results found. Medications: . [START ON 10/24/2018]  ceFAZolin (ANCEF) IV    . famotidine (PEPCID) IV 20 mg (10/22/18 0953)   . [  START ON 10/24/2018] calcitRIOL  2.5 mcg Oral Q M,W,F-HD  . Chlorhexidine Gluconate Cloth  6 each Topical Q0600  . cinacalcet  30 mg Oral Q supper  . heparin  5,000 Units Subcutaneous Q8H  . HYDROmorphone      . insulin aspart  0-15 Units Subcutaneous TID WC  . insulin aspart  3 Units Subcutaneous TID WC  . insulin glargine  15 Units Subcutaneous QHS  . sevelamer carbonate  2,400 mg Oral BID WC  . sodium chloride flush  3 mL Intravenous Once

## 2018-10-23 NOTE — Progress Notes (Signed)
Subjective:  Patient ID: [name], male    DOB: 1981/03/05,  MRN: [id number]  Patient seen in pre-op Objective:   Vitals:   10/22/18 2020 10/23/18 0551  BP: 135/76 (!) 133/91  Pulse: 88 99  Resp:    Temp: 98.4 F (36.9 C) 98.6 F (37 C)  SpO2: 100%    General AA&O x3. Normal mood and affect.  Vascular Foot warm.  Neurologic Epicritic sensation grossly absent.  Dermatologic Dressing intact left intact.  Orthopedic: Positive motor distally.   Results for orders placed or performed during the hospital encounter of 10/20/18 (from the past 24 hour(s))  Glucose, capillary     Status: Abnormal   Collection Time: 10/22/18 11:37 AM  Result Value Ref Range   Glucose-Capillary 219 (H) 70 - 99 mg/dL  CBC     Status: Abnormal   Collection Time: 10/22/18 12:33 PM  Result Value Ref Range   WBC 17.8 (H) 4.0 - 10.5 K/uL   RBC 3.92 (L) 4.22 - 5.81 MIL/uL   Hemoglobin 11.4 (L) 13.0 - 17.0 g/dL   HCT 36.6 (L) 39.0 - 52.0 %   MCV 93.4 80.0 - 100.0 fL   MCH 29.1 26.0 - 34.0 pg   MCHC 31.1 30.0 - 36.0 g/dL   RDW 14.4 11.5 - 15.5 %   Platelets 191 150 - 400 K/uL   nRBC 0.0 0.0 - 0.2 %  Basic metabolic panel     Status: Abnormal   Collection Time: 10/22/18 12:33 PM  Result Value Ref Range   Sodium 130 (L) 135 - 145 mmol/L   Potassium 3.3 (L) 3.5 - 5.1 mmol/L   Chloride 89 (L) 98 - 111 mmol/L   CO2 26 22 - 32 mmol/L   Glucose, Bld 141 (H) 70 - 99 mg/dL   BUN 35 (H) 6 - 20 mg/dL   Creatinine, Ser 8.14 (H) 0.61 - 1.24 mg/dL   Calcium 8.9 8.9 - 10.3 mg/dL   GFR calc non Af Amer 7 (L) >60 mL/min   GFR calc Af Amer 9 (L) >60 mL/min   Anion gap 15 5 - 15  Glucose, capillary     Status: None   Collection Time: 10/22/18  4:33 PM  Result Value Ref Range   Glucose-Capillary 73 70 - 99 mg/dL  Glucose, capillary     Status: None   Collection Time: 10/22/18  5:11 PM  Result Value Ref Range   Glucose-Capillary 83 70 - 99 mg/dL  Glucose, capillary     Status: Abnormal   Collection Time:  10/22/18  6:39 PM  Result Value Ref Range   Glucose-Capillary 117 (H) 70 - 99 mg/dL  Glucose, capillary     Status: Abnormal   Collection Time: 10/22/18  9:17 PM  Result Value Ref Range   Glucose-Capillary 265 (H) 70 - 99 mg/dL   Comment 1 Document in Chart   Glucose, capillary     Status: Abnormal   Collection Time: 10/23/18  5:47 AM  Result Value Ref Range   Glucose-Capillary 424 (H) 70 - 99 mg/dL   Comment 1 Document in Chart   Glucose, capillary     Status: Abnormal   Collection Time: 10/23/18  7:34 AM  Result Value Ref Range   Glucose-Capillary 442 (H) 70 - 99 mg/dL   Results for orders placed or performed during the hospital encounter of 10/20/18  Blood Culture (routine x 2)     Status: None (Preliminary result)   Collection Time: 10/20/18 12:13 PM  Result Value Ref Range Status   Specimen Description BLOOD RIGHT ANTECUBITAL  Final   Special Requests   Final    BOTTLES DRAWN AEROBIC AND ANAEROBIC Blood Culture adequate volume   Culture   Final    NO GROWTH 2 DAYS Performed at [facility], [address] [place], [place] [address]    Report Status PENDING  Incomplete  Gastrointestinal Panel by PCR , Stool     Status: None   Collection Time: 10/20/18 12:26 PM  Result Value Ref Range Status   Campylobacter species NOT DETECTED NOT DETECTED Final   Plesimonas shigelloides NOT DETECTED NOT DETECTED Final   Salmonella species NOT DETECTED NOT DETECTED Final   Yersinia enterocolitica NOT DETECTED NOT DETECTED Final   Vibrio species NOT DETECTED NOT DETECTED Final   Vibrio cholerae NOT DETECTED NOT DETECTED Final   Enteroaggregative E coli (EAEC) NOT DETECTED NOT DETECTED Final   Enteropathogenic E coli (EPEC) NOT DETECTED NOT DETECTED Final   Enterotoxigenic E coli (ETEC) NOT DETECTED NOT DETECTED Final   Shiga like toxin producing E coli (STEC) NOT DETECTED NOT DETECTED Final   Shigella/Enteroinvasive E coli (EIEC) NOT DETECTED NOT DETECTED Final   Cryptosporidium  NOT DETECTED NOT DETECTED Final   Cyclospora cayetanensis NOT DETECTED NOT DETECTED Final   Entamoeba histolytica NOT DETECTED NOT DETECTED Final   Giardia lamblia NOT DETECTED NOT DETECTED Final   Adenovirus F40/41 NOT DETECTED NOT DETECTED Final   Astrovirus NOT DETECTED NOT DETECTED Final   Norovirus GI/GII NOT DETECTED NOT DETECTED Final   Rotavirus A NOT DETECTED NOT DETECTED Final   Sapovirus (I, II, IV, and V) NOT DETECTED NOT DETECTED Final    Comment: Performed at [facility], [address] [place], [place] [address]  C difficile quick scan w PCR reflex     Status: None   Collection Time: 10/20/18 12:26 PM  Result Value Ref Range Status   C Diff antigen NEGATIVE NEGATIVE Final   C Diff toxin NEGATIVE NEGATIVE Final   C Diff interpretation No C. difficile detected.  Final    Comment: Performed at [facility], [place] [address], [place], [place] [address]  Blood Culture (routine x 2)     Status: None (Preliminary result)   Collection Time: 10/20/18  1:37 PM  Result Value Ref Range Status   Specimen Description BLOOD RIGHT HAND  Final   Special Requests   Final    BOTTLES DRAWN AEROBIC AND ANAEROBIC Blood Culture adequate volume   Culture   Final    NO GROWTH 2 DAYS Performed at [facility], [place] [address], [place], [place] [address]    Report Status PENDING  Incomplete  Aerobic/Anaerobic Culture (surgical/deep wound)     Status: None (Preliminary result)   Collection Time: 10/20/18  8:37 PM  Result Value Ref Range Status   Specimen Description ABSCESS LEFT FOOT  Final   Special Requests NONE  Final   Gram Stain   Final    FEW WBC PRESENT, PREDOMINANTLY MONONUCLEAR RARE GRAM POSITIVE COCCI    Culture   Final    MODERATE STAPHYLOCOCCUS AUREUS NO ANAEROBES ISOLATED; CULTURE IN PROGRESS FOR 5 DAYS    Report Status PENDING  Incomplete   Organism ID, Bacteria STAPHYLOCOCCUS AUREUS  Final      Susceptibility   Staphylococcus aureus - MIC*     CIPROFLOXACIN <=0.5 SENSITIVE Sensitive     ERYTHROMYCIN <=0.25 SENSITIVE Sensitive     GENTAMICIN <=  0.5 SENSITIVE Sensitive     OXACILLIN <=0.25 SENSITIVE Sensitive     TETRACYCLINE <=1 SENSITIVE Sensitive     VANCOMYCIN <=0.5 SENSITIVE Sensitive     TRIMETH/SULFA <=10 SENSITIVE Sensitive     CLINDAMYCIN <=0.25 SENSITIVE Sensitive     RIFAMPIN <=0.5 SENSITIVE Sensitive     Inducible Clindamycin Value in next row Sensitive      NEGATIVEPerformed at [place] [address] [place], [place] [address]    * MODERATE STAPHYLOCOCCUS AUREUS    Assessment & Plan:  Patien was evaluated and treated and all questions answered.  Abscess Left Foot -s/p I&D for multifocal abscess. Responding well. WBC downtrending -Continue empiric abx. -Micro pending, currently growing Staph. -OR today for repeat washout, debridement, delayed wound closure.  S/p Left 5th Partial Ray Resection with ?Residual OM -Wound found to be healed intra-op, and unlikely to be related to abscess formation noted at the great toe. -Will continue to monitor.

## 2018-10-24 ENCOUNTER — Telehealth: Payer: Self-pay | Admitting: *Deleted

## 2018-10-24 ENCOUNTER — Encounter (HOSPITAL_COMMUNITY): Payer: Self-pay | Admitting: Podiatry

## 2018-10-24 DIAGNOSIS — A4101 Sepsis due to Methicillin susceptible Staphylococcus aureus: Principal | ICD-10-CM

## 2018-10-24 LAB — CBC
HCT: 36.2 % — ABNORMAL LOW (ref 39.0–52.0)
Hemoglobin: 11.2 g/dL — ABNORMAL LOW (ref 13.0–17.0)
MCH: 28.9 pg (ref 26.0–34.0)
MCHC: 30.9 g/dL (ref 30.0–36.0)
MCV: 93.5 fL (ref 80.0–100.0)
Platelets: 153 10*3/uL (ref 150–400)
RBC: 3.87 MIL/uL — ABNORMAL LOW (ref 4.22–5.81)
RDW: 14.5 % (ref 11.5–15.5)
WBC: 3.9 10*3/uL — ABNORMAL LOW (ref 4.0–10.5)
nRBC: 0 % (ref 0.0–0.2)

## 2018-10-24 LAB — RENAL FUNCTION PANEL
Albumin: 2.2 g/dL — ABNORMAL LOW (ref 3.5–5.0)
Anion gap: 15 (ref 5–15)
BUN: 41 mg/dL — ABNORMAL HIGH (ref 6–20)
CO2: 21 mmol/L — ABNORMAL LOW (ref 22–32)
Calcium: 8.4 mg/dL — ABNORMAL LOW (ref 8.9–10.3)
Chloride: 95 mmol/L — ABNORMAL LOW (ref 98–111)
Creatinine, Ser: 9.39 mg/dL — ABNORMAL HIGH (ref 0.61–1.24)
GFR calc Af Amer: 7 mL/min — ABNORMAL LOW (ref >60)
GFR calc non Af Amer: 6 mL/min — ABNORMAL LOW (ref >60)
Glucose, Bld: 296 mg/dL — ABNORMAL HIGH (ref 70–99)
Phosphorus: 3.7 mg/dL (ref 2.5–4.6)
Potassium: 3.5 mmol/L (ref 3.5–5.1)
Sodium: 131 mmol/L — ABNORMAL LOW (ref 135–145)

## 2018-10-24 LAB — GLUCOSE, CAPILLARY
Glucose-Capillary: 106 mg/dL — ABNORMAL HIGH (ref 70–99)
Glucose-Capillary: 146 mg/dL — ABNORMAL HIGH (ref 70–99)
Glucose-Capillary: 210 mg/dL — ABNORMAL HIGH (ref 70–99)
Glucose-Capillary: 330 mg/dL — ABNORMAL HIGH (ref 70–99)
Glucose-Capillary: 405 mg/dL — ABNORMAL HIGH (ref 70–99)
Glucose-Capillary: 68 mg/dL — ABNORMAL LOW (ref 70–99)
Glucose-Capillary: 72 mg/dL (ref 70–99)

## 2018-10-24 MED ORDER — INSULIN ASPART 100 UNIT/ML ~~LOC~~ SOLN
0.0000 [IU] | Freq: Three times a day (TID) | SUBCUTANEOUS | Status: DC
  Administered 2018-10-24: 7 [IU] via SUBCUTANEOUS
  Administered 2018-10-25: 5 [IU] via SUBCUTANEOUS
  Administered 2018-10-25: 3 [IU] via SUBCUTANEOUS

## 2018-10-24 MED ORDER — CALCITRIOL 0.5 MCG PO CAPS: 2.5000 ug | ORAL_CAPSULE | ORAL | 0 refills | Status: DC

## 2018-10-24 MED ORDER — DOXYCYCLINE HYCLATE 100 MG PO TABS
100.0000 mg | ORAL_TABLET | Freq: Two times a day (BID) | ORAL | Status: DC
  Administered 2018-10-25: 100 mg via ORAL
  Filled 2018-10-24: qty 1

## 2018-10-24 MED ORDER — DOXYCYCLINE HYCLATE 100 MG PO TABS: 100.0000 mg | ORAL_TABLET | Freq: Two times a day (BID) | ORAL | 0 refills | Status: DC

## 2018-10-24 MED ORDER — RENA-VITE PO TABS: 1.0000 | ORAL_TABLET | Freq: Every day | ORAL | 0 refills | Status: DC

## 2018-10-24 MED ORDER — CALCITRIOL 0.5 MCG PO CAPS
ORAL_CAPSULE | ORAL | Status: AC
  Filled 2018-10-24: qty 5

## 2018-10-24 NOTE — Care Management Note (Deleted)
Case Management Note  Patient Details  Name: [name] MRN: [id number] Date of Birth: [birth year]  Subjective/Objective:                    Action/Plan:    Expected Discharge Date:                  Expected Discharge Plan:  [name]  In-House Referral:  NA  Discharge planning Services  CM Consult  Post Acute Care Choice:  Home Health Choice offered to:  Patient  DME Arranged:  Vac DME Agency:  [facility]  HH Arranged:  RN [place] Agency:  [facility] Status of Service:  In process, will continue to follow  If discussed at Long Length of Stay Meetings, dates discussed:    Additional Comments:  Ninfa Meeker, RN 10/24/2018, 3:51 PM

## 2018-10-24 NOTE — Progress Notes (Signed)
Patient back from HD. CBG 68. Pt alert and verbal. No confusion noted. Given apple juice. CBG rechecked 106. MD notified via text page. Will cont to monitor.

## 2018-10-24 NOTE — Discharge Instructions (Addendum)

## 2018-10-24 NOTE — Discharge Summary (Addendum)
Physician Discharge Summary  [name] YQI:[id number] DOB: 15-May-1981  PCP: No primary care provider on file.  Admit date: 10/20/2018 Discharge date: 10/25/2018  Recommendations for Outpatient Follow-up:  1. Dr. Hardie Pulley, Podiatry on 10/27/2018 for postop follow-up. 2. Dr. Mauricia Area, Nephrology 3. Hemodialysis Center: Patient advised to keep regular dialysis appointments on Mondays, Wednesdays and Fridays.  Home Health: RN for management of left foot wound VAC & dressing changes. Equipment/Devices: Wound VAC  Discharge Condition: Improved and stable CODE STATUS: Full Diet recommendation: Heart healthy and diabetic diet.  Discharge Diagnoses:  Principal Problem:   Diabetic foot infection ([place]) Active Problems:   Diabetes mellitus type 1 ([place])   Diabetic gastroparesis ([place])   ESRD (end stage renal disease) on dialysis ([place])   HTN (hypertension)   Sepsis ([place])   Cellulitis and abscess of foot   Nausea vomiting and diarrhea   Septic arthritis of left foot ([name])   DM (diabetes mellitus), secondary, uncontrolled, with neurologic complications ([name])   Diabetic ulcer of toe of left foot associated with diabetes mellitus due to underlying condition, with necrosis of muscle ([place])   Brief Summary: 38 year old single male, lives with his mother, independent, extensive PMH: Type I DM with peripheral neuropathy, diabetic gastroparesis, ESRD on MWF HD, multiple lower extremity amputations, essential HTN, presented to the [facility] ED on 10/20/2018 due to intractable nausea, vomiting, diarrhea, swelling of left foot around big toe with drainage and fevers.  Admitted for intractable nausea, vomiting, diarrhea, sepsis due to left diabetic foot infection with abscess and cellulitis.  Podiatry performed I&D in the OR on night of admission and again on 1/26.  Nephrology consulted for dialysis needs.    Assessment & Plan:  1. Intractable nausea, vomiting and diarrhea:DD: Acute viral GE,  diabetic gastroparesis flare, related to sepsis from diabetic foot infection versus other etiologies. C. difficile testing and GI panel PCR negative. Abdominal CT without contrast without acute findings. Treated supportively with bowel rest, gentle IV fluids, IV Pepcid, PRN antiemetics. Does have chronic nausea.  Resolved.  Outpatient follow-up with GI regarding chronic fecal incontinence. 2. Sepsis due to left diabetic foot infection/MSSA abscess and cellulitis, s/p I&D: Concern for abscess/cellulitis around left great toe and medial forefoot. Not sure if patient has osteomyelitis of left fifth metatarsal. Met sepsis criteria on admission. Received gentle IV fluids. Empirically started IV vancomycin, cefepime and metronidazole.  Podiatry/Dr. March Rummage consulted and performed extensive I&D in OR on night of admission.  Blood cultures x2: Negative to date.  Abscess culture shows pansensitive/MSSA.  Discontinued vancomycin, cefepime and metronidazole and started IV cefazolin.  Sepsis resolved.  Patient underwent repeat I&D on 1/26.  As per Dr. March Rummage note yesterday, patient had extensive soft tissue loss and he thinks that ultimately patient will need amputation of the great toe.  Patient however is adamantly against this and finally agreed to wound VAC, antibiotics and close outpatient follow-up.  I discussed with Dr. March Rummage who recommended 2 weeks of oral doxycycline, weightbearing as tolerated, wound VAC at discharge and follow-up with him in the office on 10/27/2018 and wound VAC to remain until that follow-up.  Patient was supposed to discharge yesterday but unfortunately wound VAC for home could not be arranged until today and hence patient stayed overnight.  Also patient reports ongoing postop pain.  Provided him with a short supply of Vicodin until outpatient follow-up with podiatry who can determine if he needs any further pain medications.  Adverse effects of opioids and appropriate use was counseled.  I  personally reviewed the [place] controlled substance database and patient was last prescribed Percocet, 5-35 mg tablets x10 tablets in November 2019 which he states that he has run out of. 3. Left diabetic foot infection: Management as per problem #2.  Improving. 4. Uncontrolled type I DM with peripheral neuropathy and renal complications: Extremely difficult to control his diabetes due to noncompliance with diet in the hospital.  He eats what he wants.  He has been counseled extensively regarding compliance with all aspects of his medical care but he states that he is an "adult man" and knows how to take care of himself and refuses many aspects of medical care that have been advised.  His CBGs have widely fluctuated from as high as 500s to lows of 68.  He has capacity to make medical decisions but I believe he is making poor judgment.  Continue prior home regimen at discharge and follow-up as outpatient.  He states that his nephrologist manages all of his primary care issues. A1C 6.7 5. ESRD on MWF QM:VHQIONGE in the hospital on 1/24 and 1/27.  Nephrology consulted. 6. Essential hypertension:Not on antihypertensives at home. Fluctuating at times related to HD.  Outpatient follow-up. 7. Leukocytosis:Secondary to sepsis.    Improved 8. Anemia: Stable.   Consultants:  Podiatry//Dr. Hardie Pulley Nephrology   Procedures:  I&D of left foot abscess in OR 1/23 HD on 1/24 & 1/27   Discharge Instructions  Discharge Instructions    Call MD for:  difficulty breathing, headache or visual disturbances   Complete by:  As directed    Call MD for:  extreme fatigue   Complete by:  As directed    Call MD for:  persistant dizziness or light-headedness   Complete by:  As directed    Call MD for:  persistant nausea and vomiting   Complete by:  As directed    Call MD for:  redness, tenderness, or signs of infection (pain, swelling, redness, odor or green/yellow discharge around incision  site)   Complete by:  As directed    Call MD for:  severe uncontrolled pain   Complete by:  As directed    Call MD for:  temperature >100.4   Complete by:  As directed    Diet - low sodium heart healthy   Complete by:  As directed    Diet Carb Modified   Complete by:  As directed    Increase activity slowly   Complete by:  As directed        Medication List    STOP taking these medications   acetaminophen 325 MG tablet Commonly known as:  TYLENOL     TAKE these medications   calcitRIOL 0.5 MCG capsule Commonly known as:  ROCALTROL Take 5 capsules (2.5 mcg total) by mouth every Monday, Wednesday, and Friday with hemodialysis. Start taking on:  October 26, 2018   cinacalcet 30 MG tablet Commonly known as:  SENSIPAR Take 30 mg by mouth daily with supper.   clonazePAM 0.5 MG tablet Commonly known as:  KLONOPIN Take 0.5 mg by mouth daily as needed for anxiety (sleep).   collagenase ointment Commonly known as:  SANTYL Apply 1 application topically daily.   doxycycline 100 MG tablet Commonly known as:  VIBRA-TABS Take 1 tablet (100 mg total) by mouth 2 (two) times daily for 14 days.   HYDROcodone-acetaminophen 5-325 MG tablet Commonly known as:  NORCO/VICODIN Take 1-2 tablets by mouth every 6 (six) hours as needed for moderate  pain or severe pain.   insulin aspart 100 UNIT/ML injection Commonly known as:  novoLOG Inject 6-7 Units into the skin 2 (two) times daily with a meal.   insulin glargine 100 UNIT/ML injection Commonly known as:  LANTUS Inject 0.15 mLs (15 Units total) into the skin at bedtime.   [employer] TEST STRIPS test strip Generic drug:  glucose blood by Other route 4 (four) times a day as needed.   multivitamin Tabs tablet Take 1 tablet by mouth at bedtime.   ondansetron 4 MG disintegrating tablet Commonly known as:  ZOFRAN ODT Take 1 tablet (4 mg total) by mouth every 8 (eight) hours as needed for nausea or vomiting.   sevelamer carbonate 800 MG  tablet Commonly known as:  RENVELA Take 2,400-3,200 mg by mouth See admin instructions. Take 3 - 4 tablets (2400 mg -3200 mg) by mouth two times daily with meals      Follow-up Information    [name], [name], MD. Schedule an appointment as soon as possible for a visit.   Specialty:  Nephrology Contact information: [place] [address] [phone]        Evelina Bucy, DPM Follow up on 10/27/2018.   Specialty:  Podiatry Why:  Postop follow-up. Contact information: [address] [place] [address] [phone]        Hemodialysis Center Follow up.   Why:  Keep your regular dialysis appointments on Mondays, Wednesdays and Fridays.       [facility] Follow up.   Specialty:  Home Health Services Why:   arepresentative from [facility] will contact you to arrange start date and time for your woubnd vac dressing changes Contact information: [place] [address] [phone]          No Known Allergies    Procedures/Studies: Ct Abdomen Pelvis Wo Contrast  Result Date: 10/20/2018 CLINICAL DATA:  Nausea, vomiting and diarrhea for 4 days. Patient is on dialysis. EXAM: CT ABDOMEN AND PELVIS WITHOUT CONTRAST TECHNIQUE: Multidetector CT imaging of the abdomen and pelvis was performed following the standard protocol without IV contrast. COMPARISON:  CT scan 07/30/2014 FINDINGS: Lower chest: Mild cardiac enlargement but no pericardial effusion. There are advanced three-vessel coronary artery calcifications for age. Hepatobiliary: No focal hepatic lesions or intrahepatic biliary dilatation. The gallbladder is grossly normal. No common bile duct dilatation. Pancreas: No mass, inflammation or ductal dilatation without contrast. Spleen: Normal size.  No focal lesions. Adrenals/Urinary Tract: The adrenal glands are normal. The kidneys demonstrate renal cortical thinning and atrophy. There are extensive renal artery  calcifications due to diabetes. No hydronephrosis or renal calculi. The bladder is grossly normal. Stomach/Bowel: The stomach, duodenum, small bowel and colon are grossly normal without oral contrast. No obvious acute inflammatory changes, mass lesions or obstructive findings. The terminal ileum and appendix are normal. Vascular/Lymphatic: Scattered aortic calcifications but no aneurysm. The aortic branch vessels are calcified including the SMA and IMA. Small scattered mesenteric and retroperitoneal lymph nodes but no mass or overt adenopathy. Reproductive: The prostate gland and seminal vesicles are normal. Extensive calcifications of the vas deferens are noted. Other: Small bilateral inguinal lymph nodes likely inflammatory/hyperplastic. No pelvic mass or adenopathy. No free pelvic fluid collections. Musculoskeletal: No significant bony findings. IMPRESSION: 1. No acute abdominal/pelvic findings, mass lesions or adenopathy given limitation of no IV contrast or oral contrast. 2. Extensive small vessel disease due to diabetes. 3. Small kidneys with renal cortical atrophy. 4. Fairly extensive, age  advanced, three-vessel coronary artery calcifications. Electronically Signed   By: Marijo Sanes M.D.   On: 10/20/2018 14:49   Dg Chest 1 View  Result Date: 10/20/2018 CLINICAL DATA:  Leukocytosis EXAM: CHEST  1 VIEW COMPARISON:  11/23/2017 FINDINGS: No acute opacity or pleural effusion. Stable cardiomediastinal silhouette. No pneumothorax. Upper extremity stents. IMPRESSION: No active disease. Electronically Signed   By: Donavan Foil M.D.   On: 10/20/2018 15:15   Dg Tibia/fibula Left  Result Date: 10/20/2018 CLINICAL DATA:  Leukocytosis.  Neuropathy. EXAM: LEFT TIBIA AND FIBULA - 2 VIEW COMPARISON:  No recent prior. FINDINGS: No acute bony or joint abnormality identified. No evidence of fracture or dislocation. Peripheral vascular calcification. IMPRESSION: 1. No acute bony or joint abnormality. No evidence of  fracture dislocation. 2.  Peripheral vascular disease. Electronically Signed   By: Marcello Moores  Register   On: 10/20/2018 15:13   Dg Foot Complete Left  Result Date: 10/20/2018 CLINICAL DATA:  Leukocytosis EXAM: LEFT FOOT - COMPLETE 3+ VIEW COMPARISON:  10/19/2018, 08/20/2018 FINDINGS: Status post resection of the fifth digit with small remnant base of fifth metatarsal. Frayed appearance at the cut surface of the metatarsal with patchy lucencies on the lateral view. Vascular calcifications. No soft tissue emphysema. IMPRESSION: Status post resection of the fifth digit with residual base of fifth metatarsal. As compared with 08/20/2018, there is indistinct appearance of the cut margin of the metatarsal with heterogeneous lucencies on the lateral view, raising concern for osteomyelitis. Electronically Signed   By: Donavan Foil M.D.   On: 10/20/2018 15:14   Dg Foot Complete Left  Result Date: 10/19/2018 Please see detailed radiograph report in office note.     Subjective: Patient was interviewed and examined with his RN in room.  Patient upset about several aspects of his care.  States that his IV line fell out and one was not placed quickly enough, he was not given a hot blanket, soiled clots in the toilet were not removed.  He is eager for discharge.  Intermittent mild to moderate pain in left foot postoperatively for which she has been using pain medicines.  Discharge Exam:  Vitals:   10/24/18 1130 10/24/18 1149 10/24/18 2210 10/25/18 0658  BP: (!) 138/109 92/68 134/89 (!) 181/98  Pulse: (!) 54 90 95 95  Resp:  '18 20 18  ' Temp:  98.3 F (36.8 C) 98.5 F (36.9 C) 98.6 F (37 C)  TempSrc:  Oral Oral Oral  SpO2:   96% 97%  Weight:  77.6 kg      General exam: Pleasant young male, moderately built and nourished lying comfortably propped up in bed.  Oral mucosa moist. Respiratory system: Clear to auscultation.  No increased work of breathing.   Cardiovascular system: S1 & S2 heard, RRR. No  JVD, murmurs, rubs, gallops or clicks. No pedal edema. Gastrointestinal system: Abdomen is nondistended, soft and nontender. No organomegaly or masses felt. Normal bowel sounds heard. Central nervous system: Alert and oriented. No focal neurological deficits. Extremities: Symmetric 5 x 5 power. Skin: Multiple tattoos over her body.  Left foot wound VAC intact and functioning well. Psychiatry: Judgement and insight appear normal. Mood & affect appropriate.     The results of significant diagnostics from this hospitalization (including imaging, microbiology, ancillary and laboratory) are listed below for reference.     Microbiology: Recent Results (from the past 240 hour(s))  WOUND CULTURE     Status: Abnormal   Collection Time: 10/19/18  3:15 PM  Result Value Ref  Range Status   MICRO NUMBER: [id number]  Final   SPECIMEN QUALITY: Adequate  Final   SOURCE: LEFT FOOT LATERAL  Final   STATUS: FINAL  Final   GRAM STAIN:   Final    No white blood cells seen No epithelial cells seen Few Gram positive cocci in pairs   ISOLATE 1: Staphylococcus aureus (A)  Final    Comment: Heavy growth of Staphylococcus aureus      Susceptibility   Staphylococcus aureus - AEROBIC CULT, GRAM STAIN POSITIVE 1    VANCOMYCIN <=0.5 Sensitive     CIPROFLOXACIN <=0.5 Sensitive     CLINDAMYCIN <=0.25 Sensitive     LEVOFLOXACIN <=0.12 Sensitive     ERYTHROMYCIN <=0.25 Sensitive     GENTAMICIN <=0.5 Sensitive     OXACILLIN* <=0.25 Sensitive      * Oxacillin-susceptible staphylococci aresusceptible to other penicillinase-stablepenicillins (e.g. Methicillin, Nafcillin), beta-lactam/beta-lactamase inhibitor combinations, andcephems with staphylococcal indications, includingCefazolin.    TETRACYCLINE <=1 Sensitive     TRIMETH/SULFA* <=10 Sensitive      * Oxacillin-susceptible staphylococci aresusceptible to other penicillinase-stablepenicillins (e.g. Methicillin, Nafcillin), beta-lactam/beta-lactamase inhibitor  combinations, andcephems with staphylococcal indications, includingCefazolin.Legend:S = Susceptible  I = IntermediateR = Resistant  NS = Not susceptible* = Not tested  NR = Not reported**NN = See antimicrobic comments  Blood Culture (routine x 2)     Status: None   Collection Time: 10/20/18 12:13 PM  Result Value Ref Range Status   Specimen Description BLOOD RIGHT ANTECUBITAL  Final   Special Requests   Final    BOTTLES DRAWN AEROBIC AND ANAEROBIC Blood Culture adequate volume   Culture   Final    NO GROWTH 5 DAYS Performed at [facility], [place] [address] [place], [place] [address]    Report Status 10/25/2018 FINAL  Final  Gastrointestinal Panel by PCR , Stool     Status: None   Collection Time: 10/20/18 12:26 PM  Result Value Ref Range Status   Campylobacter species NOT DETECTED NOT DETECTED Final   Plesimonas shigelloides NOT DETECTED NOT DETECTED Final   Salmonella species NOT DETECTED NOT DETECTED Final   Yersinia enterocolitica NOT DETECTED NOT DETECTED Final   Vibrio species NOT DETECTED NOT DETECTED Final   Vibrio cholerae NOT DETECTED NOT DETECTED Final   Enteroaggregative E coli (EAEC) NOT DETECTED NOT DETECTED Final   Enteropathogenic E coli (EPEC) NOT DETECTED NOT DETECTED Final   Enterotoxigenic E coli (ETEC) NOT DETECTED NOT DETECTED Final   Shiga like toxin producing E coli (STEC) NOT DETECTED NOT DETECTED Final   Shigella/Enteroinvasive E coli (EIEC) NOT DETECTED NOT DETECTED Final   Cryptosporidium NOT DETECTED NOT DETECTED Final   Cyclospora cayetanensis NOT DETECTED NOT DETECTED Final   Entamoeba histolytica NOT DETECTED NOT DETECTED Final   Giardia lamblia NOT DETECTED NOT DETECTED Final   Adenovirus F40/41 NOT DETECTED NOT DETECTED Final   Astrovirus NOT DETECTED NOT DETECTED Final   Norovirus GI/GII NOT DETECTED NOT DETECTED Final   Rotavirus A NOT DETECTED NOT DETECTED Final   Sapovirus (I, II, IV, and V) NOT DETECTED NOT DETECTED Final    Comment:  Performed at [facility], [place] [place], [place] [address]  C difficile quick scan w PCR reflex     Status: None   Collection Time: 10/20/18 12:26 PM  Result Value Ref Range Status   C Diff antigen NEGATIVE NEGATIVE Final   C Diff toxin NEGATIVE NEGATIVE Final   C Diff interpretation No C. difficile  detected.  Final    Comment: Performed at [facility], [place] [address] [place], [place] [address]  Blood Culture (routine x 2)     Status: None   Collection Time: 10/20/18  1:37 PM  Result Value Ref Range Status   Specimen Description BLOOD RIGHT HAND  Final   Special Requests   Final    BOTTLES DRAWN AEROBIC AND ANAEROBIC Blood Culture adequate volume   Culture   Final    NO GROWTH 5 DAYS Performed at [facility], [place] [address] [place], [place] [address]    Report Status 10/25/2018 FINAL  Final  Aerobic/Anaerobic Culture (surgical/deep wound)     Status: None   Collection Time: 10/20/18  8:37 PM  Result Value Ref Range Status   Specimen Description ABSCESS LEFT FOOT  Final   Special Requests NONE  Final   Gram Stain   Final    FEW WBC PRESENT, PREDOMINANTLY MONONUCLEAR RARE GRAM POSITIVE COCCI    Culture   Final    MODERATE STAPHYLOCOCCUS AUREUS NO ANAEROBES ISOLATED Performed at [facility], [place] [address] [place], [place] [address]    Report Status 10/25/2018 FINAL  Final   Organism ID, Bacteria STAPHYLOCOCCUS AUREUS  Final      Susceptibility   Staphylococcus aureus - MIC*    CIPROFLOXACIN <=0.5 SENSITIVE Sensitive     ERYTHROMYCIN <=0.25 SENSITIVE Sensitive     GENTAMICIN <=0.5 SENSITIVE Sensitive     OXACILLIN <=0.25 SENSITIVE Sensitive     TETRACYCLINE <=1 SENSITIVE Sensitive     VANCOMYCIN <=0.5 SENSITIVE Sensitive     TRIMETH/SULFA <=10 SENSITIVE Sensitive     CLINDAMYCIN <=0.25 SENSITIVE Sensitive     RIFAMPIN <=0.5 SENSITIVE Sensitive     Inducible Clindamycin NEGATIVE Sensitive     * MODERATE STAPHYLOCOCCUS AUREUS      Labs: CBC: Recent Labs  Lab 10/20/18 1013 10/20/18 1350 10/21/18 1022 10/22/18 1233 10/24/18 0826  WBC 26.3*  --  22.7* 17.8* 3.9*  HGB 12.8* 16.3 11.5* 11.4* 11.2*  HCT 41.1 48.0 36.1* 36.6* 36.2*  MCV 95.1  --  93.5 93.4 93.5  PLT 166  --  151 191 409   Basic Metabolic Panel: Recent Labs  Lab 10/20/18 1013 10/20/18 1350 10/21/18 1022 10/22/18 1233 10/24/18 0826  NA 130* 127* 132* 130* 131*  K 4.3 4.5 3.0* 3.3* 3.5  CL 89*  --  94* 89* 95*  CO2 25  --  21* 26 21*  GLUCOSE 210*  --  250* 141* 296*  BUN 31*  --  21* 35* 41*  CREATININE 8.82*  --  5.72* 8.14* 9.39*  CALCIUM 9.4  --  8.6* 8.9 8.4*  PHOS  --   --   --   --  3.7   Liver Function Tests: Recent Labs  Lab 10/20/18 1013 10/24/18 0826  AST 23  --   ALT 14  --   ALKPHOS 162*  --   BILITOT 1.3*  --   PROT 7.5  --   ALBUMIN 2.8* 2.2*   CBG: Recent Labs  Lab 10/24/18 1616 10/24/18 2026 10/24/18 2229 10/25/18 0650 10/25/18 1208  GLUCAP 330* 72 146* 277* 248*   Hgb A1c Recent Labs    10/23/18 1034  HGBA1C 6.7*      Time coordinating discharge: 40 minutes  SIGNED:  Vernell Leep, MD, FACP, [facility]. [facility]  To contact the attending provider between 7A-7P or the covering provider during after hours 7P-7A,  please log into the web site [email or web address] and access using universal [place] password for that web site. If you do not have the password, please call the hospital operator.

## 2018-10-24 NOTE — Progress Notes (Signed)
Notified MD of patient's blood sugar reading of 405.

## 2018-10-24 NOTE — Consult Note (Signed)
[facility] Nurse wound consult note Reason for Consult: placement NPWT dressing to surgical wound Wound type: surgical  Pressure Injury POA: Yes/No/NA Measurement: 7cm x 4cm x 0.5cm  Wound bed: tendon visible, opening deeper at 3 o'clock. Great toe is black and there is soupy yellow drainage between the great toe and the 2nd toe.  Drainage (amount, consistency, odor) moderate, serosanguineous  Periwound: edema Dressing procedure/placement/frequency: Lined distal aspect along the plantar surface of the wound edge with hydrocolloid to aid in seal. Use strip of ostomy barrier ring between the great toe and the second toe.  Cut small strip of black foam to tuck into the deeper tunneled area at 3 o'clock. Covered the tendon from the great toe with single layer of xeroform. Used 1 additional piece of black foam to cover wound bed.  Sealed at 125mmHG, patient tolerated well.  Patient to be hooked to home unit for DC and [facility] to assume dressing changes.  Discussed POC with patient and bedside nurse.  Re consult if needed, will not follow at this time. Thanks  Kirstyn Lean [employer], RN,CWOCN, CNS, [place] [phone])

## 2018-10-24 NOTE — Procedures (Signed)
I was present at this dialysis session. I have reviewed the session itself and made appropriate changes.   4K bath. 2L UF goal. Tol well using LUE AV Access.  Hb ok.  Pt states likely leaving today.    Filed Weights   10/21/18 1112 10/21/18 1536  Weight: 76.7 kg 74.4 kg    Recent Labs  Lab 10/22/18 1233  NA 130*  K 3.3*  CL 89*  CO2 26  GLUCOSE 141*  BUN 35*  CREATININE 8.14*  CALCIUM 8.9    Recent Labs  Lab 10/20/18 1013 10/20/18 1350 10/21/18 1022 10/22/18 1233  WBC 26.3*  --  22.7* 17.8*  HGB 12.8* 16.3 11.5* 11.4*  HCT 41.1 48.0 36.1* 36.6*  MCV 95.1  --  93.5 93.4  PLT 166  --  151 191    Scheduled Meds: . calcitRIOL  2.5 mcg Oral Q M,W,F-HD  . Chlorhexidine Gluconate Cloth  6 each Topical Q0600  . cinacalcet  30 mg Oral Q supper  . heparin  5,000 Units Subcutaneous Q8H  . insulin aspart  0-15 Units Subcutaneous TID WC  . insulin aspart  3 Units Subcutaneous TID WC  . insulin glargine  15 Units Subcutaneous QHS  . multivitamin  1 tablet Oral QHS  . sevelamer carbonate  2,400 mg Oral TID WC  . sodium chloride flush  3 mL Intravenous Once   Continuous Infusions: .  ceFAZolin (ANCEF) IV    . famotidine (PEPCID) IV 20 mg (10/22/18 0953)   PRN Meds:.acetaminophen **OR** acetaminophen, albuterol, hydrALAZINE, HYDROcodone-acetaminophen, menthol-cetylpyridinium, morphine injection, ondansetron **OR** ondansetron (ZOFRAN) IV, phenol, promethazine   Shane Grippe  MD 10/24/2018, 8:33 AM

## 2018-10-24 NOTE — Progress Notes (Signed)
RN gave report to receiving dialysis RN.

## 2018-10-24 NOTE — Care Management Note (Addendum)
Case Management Note  Patient Details  Name: [name] MRN: [id number] Date of Birth: [birth year]  Subjective/Objective:   38 yr old male admitted with diabetic foot ulcers. Patient underwent I & D of  left foot and right lower leg 10/20/18 and 10/23/18.               Action/Plan: Case manager spoke with patient concerning discharge plan and need for wound vac at discharge. Choice for [place] provided.  Referral for Home Health RN called to Neoma Laming, [place] Liaison. CM has faxed [employer] authorization form to Dr. March Rummage at [phone], Called request for Wound vac to [facility] with [employer] [phone], will fax order and notes to her at [phone].Patient and mom share an apartment. Patient goes to dialysis Monday, Wednesday, Friday. Wound care discussed with patient.     Expected Discharge Date:    10/25/18           Expected Discharge Plan:  [place]  In-House Referral:  NA  Discharge planning Services  CM Consult  Post Acute Care Choice:  Home Health Choice offered to:  Patient  DME Arranged:  Vac DME Agency:  [employer]  HH Arranged:  RN [place] Agency:  [place]  Status of Service:  In process, will continue to follow  If discussed at Long Length of Stay Meetings, dates discussed:    Additional Comments:  Ninfa Meeker, RN 10/24/2018, 1:45 PM

## 2018-10-24 NOTE — Progress Notes (Signed)
Patient received 15 units of insulin per MD order. Report called in to Dialysis.

## 2018-10-24 NOTE — Telephone Encounter (Signed)
Dr. March Rummage states pt is in the hospital and needs wound vac and HHC. Dr. March Rummage states the hospital advocate asked if pt had been seen by [facility]. I told Dr. March Rummage I did not think so.

## 2018-10-24 NOTE — Progress Notes (Signed)
RN notified MD times two for CBG 405.

## 2018-10-24 NOTE — Plan of Care (Signed)
  Problem: Education: Goal: Knowledge of General Education information will improve Description: Including pain rating scale, medication(s)/side effects and non-pharmacologic comfort measures Outcome: Progressing   Problem: Clinical Measurements: Goal: Ability to maintain clinical measurements within normal limits will improve Outcome: Progressing   Problem: Activity: Goal: Risk for activity intolerance will decrease Outcome: Progressing   Problem: Safety: Goal: Ability to remain free from injury will improve Outcome: Progressing   Problem: Skin Integrity: Goal: Risk for impaired skin integrity will decrease Outcome: Progressing   

## 2018-10-24 NOTE — Progress Notes (Signed)
RN notified MD times three for CBG 405.

## 2018-10-24 NOTE — Progress Notes (Signed)
Inpatient Diabetes Program Recommendations  AACE/ADA: New Consensus Statement on Inpatient Glycemic Control (2015)  Target Ranges:  Prepandial:   less than 140 mg/dL      Peak postprandial:   less than 180 mg/dL (1-2 hours)      Critically ill patients:  140 - 180 mg/dL   Lab Results  Component Value Date   GLUCAP 106 (H) 10/24/2018   HGBA1C 6.7 (H) 10/23/2018    Review of Glycemic Control Results for [name], [name] (MRN [id number]) as of 10/24/2018 14:47  Ref. Range 10/23/2018 16:56 10/23/2018 21:56 10/23/2018 23:59 10/24/2018 06:32 10/24/2018 12:33 10/24/2018 13:35  Glucose-Capillary Latest Ref Range: 70 - 99 mg/dL 322 (H) 208 (H) 210 (H) 405 (H) 68 (L) 106 (H)   Diabetes history: Type 1 DM  Outpatient Diabetes medications:  Lantus 15 units q HS, Novolog 6-7 units bid with meals Current orders for Inpatient glycemic control:  Lantus 15 units q HS, Novolog 3 units tid with meals, Novolog 0-15 units tid with meals  Inpatient Diabetes Program Recommendations:    Note that AM blood sugars have been increased.   -Due to ESRD, please reduce Novolog to sensitive tid with meals - Consider checking 2 am blood sugar to ensure that blood sugars are not dropping in the middle of the night  Thanks,  Adah Perl, RN, BC-ADM Inpatient Diabetes Coordinator Pager [phone] (8a-5p)

## 2018-10-25 LAB — CULTURE, BLOOD (ROUTINE X 2)
Culture: NO GROWTH
Culture: NO GROWTH
Special Requests: ADEQUATE
Special Requests: ADEQUATE

## 2018-10-25 LAB — GLUCOSE, CAPILLARY
Glucose-Capillary: 277 mg/dL — ABNORMAL HIGH (ref 70–99)
Glucose-Capillary: 248 mg/dL — ABNORMAL HIGH (ref 70–99)
Glucose-Capillary: 138 mg/dL — ABNORMAL HIGH (ref 70–99)

## 2018-10-25 LAB — AEROBIC/ANAEROBIC CULTURE W GRAM STAIN (SURGICAL/DEEP WOUND)

## 2018-10-25 MED ORDER — HYDROCODONE-ACETAMINOPHEN 5-325 MG PO TABS: 1.0000 | ORAL_TABLET | Freq: Four times a day (QID) | ORAL | 0 refills | Status: DC | PRN

## 2018-10-25 NOTE — Care Management (Signed)
Case manager received notification from Leontine Locket, [place] representative that the [facility] has been authorized and will be delivered to the hospital by 3:00pm.  CM notified attending and patient's nurse.

## 2018-10-25 NOTE — Progress Notes (Signed)
Subjective:  Signed off dialysis early.  No problems just felt he had met his goal.  Objective Vital signs in last 24 hours: Vitals:   10/24/18 1130 10/24/18 1149 10/24/18 2210 10/25/18 0658  BP: (!) 138/109 92/68 134/89 (!) 181/98  Pulse: (!) 54 90 95 95  Resp:  _0 Temp:  98.3 F (36.8 C) 98.5 F (36.9 C) 98.6 F (37 C)  TempSrc:  Oral Oral Oral  SpO2:   96% 97%  Weight:  77.6 kg     Physical Exam: General: Agitated about situation talking to RN - nonstop Heart: RRR  Lungs: CTA bilat  Extremities: No LE edema, R Foot sp Toe amps, L foot dressing  Dialysis Access: LUA AVF + bruit   Home meds: - insulin aspart SSI ac/ insulin glargine 10u hs - sevelamer carb 3-4 ac tid/ cinacalcet 30 hs - prn's  OP Dialysis:MWF [facility] 4h 75.5kg Hep 8000 2/2 bath LUA AVG  - calcitriol 2.5 ug tiw - venofer 150m x 7  Problem/Plan: 1. Diabetic Foot Infection SP I&D Dr PMarch Rummage1/24/20 and 1/26 with VAC - planning on d/c today - WBC oddly low yesterday - suspect an error; d/c on doxycycline - [facility] due [facility] - awaiting acceptance to manage VAC 2. ESRD - MWF - for d/c today HD Wed at home HD unit - requiring added K bath here 3. Anemia - HGB 11.5 >11.4 > 11.2 stable no esa needs, Hold iron with infection  4. Secondary hyperparathyroidism - calcitriol on hd , sensipar  Ca/P ok renvela binder  5. HTN/volume - net UF 1 L [pst wt 77.6 - not weighed pre - different from Friday weight- reassess EDW at HD tomorrow 6. Nutrition - alb 2.2  7. IDDM type 1 - sugars running a little high   MAmalia Hailey PA-C [facility] [id number]/28/2020,8:48 AM  LOS: 5 days      Labs: Basic Metabolic Panel: Recent Labs  Lab 10/21/18 1022 10/22/18 1233 10/24/18 0826  NA 132* 130* 131*  K 3.0* 3.3* 3.5  CL 94* 89* 95*  CO2 21* 26 21*  GLUCOSE 250* 141* 296*  BUN 21* 35* 41*  CREATININE 5.72* 8.14* 9.39*  CALCIUM 8.6* 8.9  8.4*  PHOS  --   --  3.7   Liver Function Tests: Recent Labs  Lab 10/20/18 1013 10/24/18 0826  AST 23  --   ALT 14  --   ALKPHOS 162*  --   BILITOT 1.3*  --   PROT 7.5  --   ALBUMIN 2.8* 2.2*   Recent Labs  Lab 10/20/18 1013  LIPASE 28   No results for input(s): AMMONIA in the last 168 hours. CBC: Recent Labs  Lab 10/20/18 1013  10/21/18 1022 10/22/18 1233 10/24/18 0826  WBC 26.3*  --  22.7* 17.8* 3.9*  HGB 12.8*   < > 11.5* 11.4* 11.2*  HCT 41.1   < > 36.1* 36.6* 36.2*  MCV 95.1  --  93.5 93.4 93.5  PLT 166  --  151 191 153   < > = values in this interval not displayed.   Cardiac Enzymes: No results for input(s): CKTOTAL, CKMB, CKMBINDEX, TROPONINI in the last 168 hours. CBG: Recent Labs  Lab 10/24/18 1335 10/24/18 1616 10/24/18 2026 10/24/18 2229 10/25/18 0650  GLUCAP 106* 330* 72 146* 277*    Studies/Results: No results found. Medications: . famotidine (PEPCID) IV 20 mg (10/22/18 0953)   .  calcitRIOL  2.5 mcg Oral Q M,W,F-HD  . Chlorhexidine Gluconate Cloth  6 each Topical Q0600  . cinacalcet  30 mg Oral Q supper  . doxycycline  100 mg Oral BID  . heparin  5,000 Units Subcutaneous Q8H  . insulin aspart  0-9 Units Subcutaneous TID WC  . insulin aspart  3 Units Subcutaneous TID WC  . insulin glargine  15 Units Subcutaneous QHS  . multivitamin  1 tablet Oral QHS  . sevelamer carbonate  2,400 mg Oral TID WC  . sodium chloride flush  3 mL Intravenous Once

## 2018-10-25 NOTE — Consult Note (Addendum)
   [facility] CM Inpatient Consult   10/25/2018  [name] 1981-03-20 [id number]    Patient screened for potential [facility] Care Management services due to unplanned readmission risk score of 29% (high).  Chart reviewed. Noted patient goes to [name] for nephrology. No PCP listed. Went to bedside to speak with [name] about potential [facility] Care Management services. He declined [facility] Care Management services by shaking his head no. He did not express any interest in discussing any further. Patient started back talking on his cell phone while writer was speaking with him.   Left [facility] Care Management brochure at beside.   Made inpatient RNCM aware [place] Management services were declined.    Marthenia Rolling, MSN-Ed, RN,BSN [facility] Liaison [phone]

## 2018-10-25 NOTE — Progress Notes (Signed)
Progress Notes  Patient was interviewed and examined with RN in room.  Patient was supposed to discharge on 1/27 but could not because wound VAC for home could not be arranged until this afternoon.  Patient upset regarding many aspects of his care and anxious to DC.  He sitting up comfortably in bed and does not appear in any distress.  Vital signs and physical exam stable.  I communicated with case manager, wound VAC has been arranged for today and patient to be discharged home with close outpatient follow-up with podiatry and his dialysis center.  Updated DC summary from yesterday with new meds and exam.  Vernell Leep, MD, FACP, [facility]. [facility]  To contact the attending provider between 7A-7P or the covering provider during after hours 7P-7A, please log into the web site [email or web address] and access using universal [place] password for that web site. If you do not have the password, please call the hospital operator.

## 2018-10-25 NOTE — Progress Notes (Signed)
[employer] wound vac representative here.  Instructions given.  The patient was set up with the wound vac and is in place.  Written and verbal discharge instructions have been given to the patient.  The patient verbalizes understanding and the follow up needed once discharged.

## 2018-10-26 ENCOUNTER — Emergency Department (HOSPITAL_COMMUNITY): Payer: Medicare Other

## 2018-10-26 ENCOUNTER — Inpatient Hospital Stay (HOSPITAL_COMMUNITY)
Admission: EM | Admit: 2018-10-26 | Discharge: 2018-11-03 | DRG: 617 | Disposition: A | Discharge status: Home Health | Payer: Medicare Other | Attending: Internal Medicine | Admitting: Internal Medicine

## 2018-10-26 ENCOUNTER — Encounter (HOSPITAL_COMMUNITY): Payer: Self-pay | Admitting: Emergency Medicine

## 2018-10-26 ENCOUNTER — Other Ambulatory Visit: Payer: Self-pay

## 2018-10-26 ENCOUNTER — Telehealth: Payer: Self-pay | Admitting: *Deleted

## 2018-10-26 DIAGNOSIS — E1349 Other specified diabetes mellitus with other diabetic neurological complication: Secondary | ICD-10-CM | POA: Diagnosis not present

## 2018-10-26 DIAGNOSIS — N2581 Secondary hyperparathyroidism of renal origin: Secondary | ICD-10-CM | POA: Diagnosis present

## 2018-10-26 DIAGNOSIS — IMO0002 Reserved for concepts with insufficient information to code with codable children: Secondary | ICD-10-CM | POA: Diagnosis present

## 2018-10-26 DIAGNOSIS — N186 End stage renal disease: Secondary | ICD-10-CM

## 2018-10-26 DIAGNOSIS — E1143 Type 2 diabetes mellitus with diabetic autonomic (poly)neuropathy: Secondary | ICD-10-CM | POA: Diagnosis present

## 2018-10-26 DIAGNOSIS — Z833 Family history of diabetes mellitus: Secondary | ICD-10-CM | POA: Diagnosis not present

## 2018-10-26 DIAGNOSIS — Z4781 Encounter for orthopedic aftercare following surgical amputation: Secondary | ICD-10-CM

## 2018-10-26 DIAGNOSIS — E11621 Type 2 diabetes mellitus with foot ulcer: Secondary | ICD-10-CM | POA: Diagnosis present

## 2018-10-26 DIAGNOSIS — L089 Local infection of the skin and subcutaneous tissue, unspecified: Secondary | ICD-10-CM

## 2018-10-26 DIAGNOSIS — L03114 Cellulitis of left upper limb: Secondary | ICD-10-CM | POA: Diagnosis not present

## 2018-10-26 DIAGNOSIS — E1152 Type 2 diabetes mellitus with diabetic peripheral angiopathy with gangrene: Secondary | ICD-10-CM | POA: Diagnosis not present

## 2018-10-26 DIAGNOSIS — E1122 Type 2 diabetes mellitus with diabetic chronic kidney disease: Secondary | ICD-10-CM | POA: Diagnosis not present

## 2018-10-26 DIAGNOSIS — D638 Anemia in other chronic diseases classified elsewhere: Secondary | ICD-10-CM | POA: Diagnosis present

## 2018-10-26 DIAGNOSIS — I1311 Hypertensive heart and chronic kidney disease without heart failure, with stage 5 chronic kidney disease, or end stage renal disease: Secondary | ICD-10-CM | POA: Diagnosis present

## 2018-10-26 DIAGNOSIS — E1365 Other specified diabetes mellitus with hyperglycemia: Secondary | ICD-10-CM | POA: Diagnosis not present

## 2018-10-26 DIAGNOSIS — I96 Gangrene, not elsewhere classified: Secondary | ICD-10-CM

## 2018-10-26 DIAGNOSIS — L03032 Cellulitis of left toe: Secondary | ICD-10-CM

## 2018-10-26 DIAGNOSIS — Z992 Dependence on renal dialysis: Secondary | ICD-10-CM

## 2018-10-26 DIAGNOSIS — E08621 Diabetes mellitus due to underlying condition with foot ulcer: Secondary | ICD-10-CM | POA: Diagnosis not present

## 2018-10-26 DIAGNOSIS — D509 Iron deficiency anemia, unspecified: Secondary | ICD-10-CM | POA: Diagnosis not present

## 2018-10-26 DIAGNOSIS — D631 Anemia in chronic kidney disease: Secondary | ICD-10-CM | POA: Diagnosis not present

## 2018-10-26 DIAGNOSIS — Z841 Family history of disorders of kidney and ureter: Secondary | ICD-10-CM | POA: Diagnosis not present

## 2018-10-26 DIAGNOSIS — M86172 Other acute osteomyelitis, left ankle and foot: Secondary | ICD-10-CM | POA: Diagnosis present

## 2018-10-26 DIAGNOSIS — I129 Hypertensive chronic kidney disease with stage 1 through stage 4 chronic kidney disease, or unspecified chronic kidney disease: Secondary | ICD-10-CM | POA: Diagnosis not present

## 2018-10-26 DIAGNOSIS — E109 Type 1 diabetes mellitus without complications: Secondary | ICD-10-CM

## 2018-10-26 DIAGNOSIS — E1142 Type 2 diabetes mellitus with diabetic polyneuropathy: Secondary | ICD-10-CM | POA: Diagnosis not present

## 2018-10-26 DIAGNOSIS — L0390 Cellulitis, unspecified: Secondary | ICD-10-CM

## 2018-10-26 DIAGNOSIS — K3184 Gastroparesis: Secondary | ICD-10-CM | POA: Diagnosis present

## 2018-10-26 DIAGNOSIS — L02619 Cutaneous abscess of unspecified foot: Secondary | ICD-10-CM | POA: Diagnosis not present

## 2018-10-26 DIAGNOSIS — L97529 Non-pressure chronic ulcer of other part of left foot with unspecified severity: Secondary | ICD-10-CM | POA: Diagnosis present

## 2018-10-26 DIAGNOSIS — M869 Osteomyelitis, unspecified: Secondary | ICD-10-CM | POA: Diagnosis not present

## 2018-10-26 DIAGNOSIS — E119 Type 2 diabetes mellitus without complications: Secondary | ICD-10-CM | POA: Diagnosis not present

## 2018-10-26 DIAGNOSIS — M79672 Pain in left foot: Secondary | ICD-10-CM | POA: Diagnosis not present

## 2018-10-26 DIAGNOSIS — L97426 Non-pressure chronic ulcer of left heel and midfoot with bone involvement without evidence of necrosis: Secondary | ICD-10-CM | POA: Diagnosis not present

## 2018-10-26 DIAGNOSIS — E1022 Type 1 diabetes mellitus with diabetic chronic kidney disease: Secondary | ICD-10-CM | POA: Diagnosis not present

## 2018-10-26 DIAGNOSIS — E11649 Type 2 diabetes mellitus with hypoglycemia without coma: Secondary | ICD-10-CM | POA: Diagnosis not present

## 2018-10-26 DIAGNOSIS — Z79899 Other long term (current) drug therapy: Secondary | ICD-10-CM

## 2018-10-26 DIAGNOSIS — E1052 Type 1 diabetes mellitus with diabetic peripheral angiopathy with gangrene: Secondary | ICD-10-CM | POA: Diagnosis not present

## 2018-10-26 DIAGNOSIS — S91302A Unspecified open wound, left foot, initial encounter: Secondary | ICD-10-CM | POA: Diagnosis not present

## 2018-10-26 DIAGNOSIS — E1165 Type 2 diabetes mellitus with hyperglycemia: Secondary | ICD-10-CM | POA: Diagnosis present

## 2018-10-26 DIAGNOSIS — Z9889 Other specified postprocedural states: Secondary | ICD-10-CM

## 2018-10-26 DIAGNOSIS — Z794 Long term (current) use of insulin: Secondary | ICD-10-CM

## 2018-10-26 DIAGNOSIS — E108 Type 1 diabetes mellitus with unspecified complications: Secondary | ICD-10-CM

## 2018-10-26 DIAGNOSIS — L02612 Cutaneous abscess of left foot: Secondary | ICD-10-CM

## 2018-10-26 DIAGNOSIS — Z89422 Acquired absence of other left toe(s): Secondary | ICD-10-CM | POA: Diagnosis not present

## 2018-10-26 DIAGNOSIS — I10 Essential (primary) hypertension: Secondary | ICD-10-CM | POA: Diagnosis not present

## 2018-10-26 DIAGNOSIS — E1169 Type 2 diabetes mellitus with other specified complication: Secondary | ICD-10-CM | POA: Diagnosis not present

## 2018-10-26 DIAGNOSIS — E118 Type 2 diabetes mellitus with unspecified complications: Secondary | ICD-10-CM

## 2018-10-26 DIAGNOSIS — Z9119 Patient's noncompliance with other medical treatment and regimen: Secondary | ICD-10-CM

## 2018-10-26 DIAGNOSIS — Z89421 Acquired absence of other right toe(s): Secondary | ICD-10-CM

## 2018-10-26 DIAGNOSIS — L03119 Cellulitis of unspecified part of limb: Secondary | ICD-10-CM | POA: Diagnosis not present

## 2018-10-26 DIAGNOSIS — L03116 Cellulitis of left lower limb: Secondary | ICD-10-CM | POA: Diagnosis not present

## 2018-10-26 DIAGNOSIS — R739 Hyperglycemia, unspecified: Secondary | ICD-10-CM | POA: Diagnosis not present

## 2018-10-26 DIAGNOSIS — L97423 Non-pressure chronic ulcer of left heel and midfoot with necrosis of muscle: Secondary | ICD-10-CM | POA: Diagnosis not present

## 2018-10-26 DIAGNOSIS — E11628 Type 2 diabetes mellitus with other skin complications: Secondary | ICD-10-CM | POA: Diagnosis present

## 2018-10-26 DIAGNOSIS — L97523 Non-pressure chronic ulcer of other part of left foot with necrosis of muscle: Secondary | ICD-10-CM | POA: Diagnosis not present

## 2018-10-26 LAB — CBC WITH DIFFERENTIAL/PLATELET
Abs Immature Granulocytes: 0.2 10*3/uL — ABNORMAL HIGH (ref 0.00–0.07)
Basophils Absolute: 0 10*3/uL (ref 0.0–0.1)
Basophils Relative: 0 %
Eosinophils Absolute: 0.3 10*3/uL (ref 0.0–0.5)
Eosinophils Relative: 2 %
HCT: 33.3 % — ABNORMAL LOW (ref 39.0–52.0)
Hemoglobin: 10 g/dL — ABNORMAL LOW (ref 13.0–17.0)
Immature Granulocytes: 2 %
Lymphocytes Relative: 7 %
Lymphs Abs: 1 10*3/uL (ref 0.7–4.0)
MCH: 29.1 pg (ref 26.0–34.0)
MCHC: 30 g/dL (ref 30.0–36.0)
MCV: 96.8 fL (ref 80.0–100.0)
Monocytes Absolute: 0.9 10*3/uL (ref 0.1–1.0)
Monocytes Relative: 7 %
Neutro Abs: 11 10*3/uL — ABNORMAL HIGH (ref 1.7–7.7)
Neutrophils Relative %: 82 %
Platelets: 182 10*3/uL (ref 150–400)
RBC: 3.44 MIL/uL — ABNORMAL LOW (ref 4.22–5.81)
RDW: 14.8 % (ref 11.5–15.5)
WBC: 13.4 10*3/uL — ABNORMAL HIGH (ref 4.0–10.5)
nRBC: 0 % (ref 0.0–0.2)

## 2018-10-26 LAB — COMPREHENSIVE METABOLIC PANEL
ALT: 5 U/L (ref 0–44)
AST: 23 U/L (ref 15–41)
Albumin: 2.5 g/dL — ABNORMAL LOW (ref 3.5–5.0)
Alkaline Phosphatase: 112 U/L (ref 38–126)
Anion gap: 15 (ref 5–15)
BUN: 13 mg/dL (ref 6–20)
CO2: 26 mmol/L (ref 22–32)
Calcium: 9.4 mg/dL (ref 8.9–10.3)
Chloride: 91 mmol/L — ABNORMAL LOW (ref 98–111)
Creatinine, Ser: 5.83 mg/dL — ABNORMAL HIGH (ref 0.61–1.24)
GFR calc Af Amer: 13 mL/min — ABNORMAL LOW (ref >60)
GFR calc non Af Amer: 11 mL/min — ABNORMAL LOW (ref >60)
Glucose, Bld: 203 mg/dL — ABNORMAL HIGH (ref 70–99)
Potassium: 3.8 mmol/L (ref 3.5–5.1)
Sodium: 132 mmol/L — ABNORMAL LOW (ref 135–145)
Total Bilirubin: 0.7 mg/dL (ref 0.3–1.2)
Total Protein: 7.3 g/dL (ref 6.5–8.1)

## 2018-10-26 LAB — GLUCOSE, CAPILLARY: Glucose-Capillary: 312 mg/dL — ABNORMAL HIGH (ref 70–99)

## 2018-10-26 LAB — CBG MONITORING, ED: Glucose-Capillary: 274 mg/dL — ABNORMAL HIGH (ref 70–99)

## 2018-10-26 LAB — LACTIC ACID, PLASMA
Lactic Acid, Venous: 2.6 mmol/L (ref 0.5–1.9)
Lactic Acid, Venous: 2 mmol/L (ref 0.5–1.9)

## 2018-10-26 MED ORDER — ONDANSETRON 4 MG PO TBDP: 4.0000 mg | ORAL_TABLET | Freq: Three times a day (TID) | ORAL | Status: DC | PRN

## 2018-10-26 MED ORDER — CINACALCET HCL 30 MG PO TABS
30.0000 mg | ORAL_TABLET | Freq: Every day | ORAL | Status: DC
  Administered 2018-10-27 – 2018-11-02 (×6): 30 mg via ORAL
  Filled 2018-10-26 (×6): qty 1

## 2018-10-26 MED ORDER — HYDROCODONE-ACETAMINOPHEN 5-325 MG PO TABS
1.0000 | ORAL_TABLET | Freq: Four times a day (QID) | ORAL | Status: DC | PRN
  Administered 2018-10-26 – 2018-10-29 (×9): 2 via ORAL
  Administered 2018-10-29: 1 via ORAL
  Administered 2018-10-30 – 2018-11-03 (×15): 2 via ORAL
  Filled 2018-10-26 (×25): qty 2

## 2018-10-26 MED ORDER — INSULIN ASPART 100 UNIT/ML ~~LOC~~ SOLN: 6.0000 [IU] | Freq: Two times a day (BID) | SUBCUTANEOUS | Status: DC

## 2018-10-26 MED ORDER — ONDANSETRON HCL 4 MG/2ML IJ SOLN
4.0000 mg | Freq: Four times a day (QID) | INTRAMUSCULAR | Status: DC | PRN
  Filled 2018-10-26: qty 2

## 2018-10-26 MED ORDER — SODIUM CHLORIDE 0.9 % IV SOLN
250.0000 mL | INTRAVENOUS | Status: DC | PRN
  Administered 2018-10-28: 14:00:00 via INTRAVENOUS

## 2018-10-26 MED ORDER — CLONAZEPAM 0.5 MG PO TABS
0.5000 mg | ORAL_TABLET | Freq: Every day | ORAL | Status: DC | PRN
  Administered 2018-10-28 – 2018-11-01 (×3): 0.5 mg via ORAL
  Filled 2018-10-26 (×4): qty 1

## 2018-10-26 MED ORDER — SODIUM CHLORIDE 0.9 % IV BOLUS
500.0000 mL | Freq: Once | INTRAVENOUS | Status: AC
  Administered 2018-10-26: 500 mL via INTRAVENOUS

## 2018-10-26 MED ORDER — RENA-VITE PO TABS
1.0000 | ORAL_TABLET | Freq: Every day | ORAL | Status: DC
  Administered 2018-10-26 – 2018-11-02 (×7): 1 via ORAL
  Filled 2018-10-26 (×7): qty 1

## 2018-10-26 MED ORDER — SEVELAMER CARBONATE 800 MG PO TABS
2400.0000 mg | ORAL_TABLET | Freq: Two times a day (BID) | ORAL | Status: DC
  Administered 2018-10-27: 2400 mg via ORAL
  Filled 2018-10-26: qty 4

## 2018-10-26 MED ORDER — SODIUM CHLORIDE 0.9% FLUSH: 3.0000 mL | INTRAVENOUS | Status: DC | PRN

## 2018-10-26 MED ORDER — SODIUM CHLORIDE 0.9% FLUSH
3.0000 mL | Freq: Two times a day (BID) | INTRAVENOUS | Status: DC
  Administered 2018-10-27 – 2018-11-02 (×7): 3 mL via INTRAVENOUS

## 2018-10-26 MED ORDER — FENTANYL CITRATE (PF) 100 MCG/2ML IJ SOLN
50.0000 ug | Freq: Once | INTRAMUSCULAR | Status: AC
  Administered 2018-10-26: 50 ug via INTRAMUSCULAR
  Filled 2018-10-26: qty 2

## 2018-10-26 MED ORDER — HYDROMORPHONE HCL 1 MG/ML IJ SOLN
1.0000 mg | Freq: Once | INTRAMUSCULAR | Status: AC
  Administered 2018-10-26: 1 mg via INTRAVENOUS
  Filled 2018-10-26: qty 1

## 2018-10-26 MED ORDER — INSULIN GLARGINE 100 UNIT/ML ~~LOC~~ SOLN
15.0000 [IU] | Freq: Every day | SUBCUTANEOUS | Status: DC
  Administered 2018-10-28: 15 [IU] via SUBCUTANEOUS
  Filled 2018-10-26 (×2): qty 0.15

## 2018-10-26 MED ORDER — SODIUM CHLORIDE 0.9 % IV SOLN: 1.0000 g | INTRAVENOUS | Status: DC

## 2018-10-26 MED ORDER — VANCOMYCIN HCL 10 G IV SOLR
1500.0000 mg | Freq: Once | INTRAVENOUS | Status: AC
  Administered 2018-10-26: 1500 mg via INTRAVENOUS
  Filled 2018-10-26: qty 1500

## 2018-10-26 MED ORDER — ONDANSETRON HCL 4 MG PO TABS: 4.0000 mg | ORAL_TABLET | Freq: Four times a day (QID) | ORAL | Status: DC | PRN

## 2018-10-26 MED ORDER — CALCITRIOL 0.5 MCG PO CAPS
2.5000 ug | ORAL_CAPSULE | ORAL | Status: DC
  Administered 2018-10-28 – 2018-11-02 (×3): 2.5 ug via ORAL

## 2018-10-26 MED ORDER — VANCOMYCIN HCL IN DEXTROSE 750-5 MG/150ML-% IV SOLN: 750.0000 mg | INTRAVENOUS | Status: DC

## 2018-10-26 MED ORDER — SODIUM CHLORIDE 0.9 % IV SOLN
1.0000 g | Freq: Once | INTRAVENOUS | Status: AC
  Administered 2018-10-26: 1 g via INTRAVENOUS
  Filled 2018-10-26: qty 1

## 2018-10-26 NOTE — Progress Notes (Signed)
Pharmacy Antibiotic Note  [name] is a 38 y.o. male admitted on 10/26/2018 with ESRD on HD. He was recently admitted and discharged with a wound vac for a foot infection. The patient presents today with worsening infection. WBC 13.6, currently afebrile. Concern for osteomyelitis.   Plan: -Cefepime 1 g IV q24h -Vancomycin 1500 mg IV x1 then 750 mg IV qHD -Monitor HD schedule and tolerance   Temp (24hrs), Avg:99.8 F (37.7 C), Min:99.8 F (37.7 C), Max:99.8 F (37.7 C)  Recent Labs  Lab 10/20/18 1013 10/20/18 1242 10/20/18 2228 10/21/18 1022 10/22/18 1233 10/24/18 0826 10/26/18 1716 10/26/18 1822  WBC 26.3*  --   --  22.7* 17.8* 3.9* 13.4*  --   CREATININE 8.82*  --   --  5.72* 8.14* 9.39* 5.83*  --   LATICACIDVEN  --  1.2 1.4  --   --   --  2.6* 2.0*      Antimicrobials this admission: 1/29 cefepime > 1/29 vancomycin >  Dose adjustments this admission: N/A  Microbiology results: 1/29 blood cx:   [name] 10/26/2018 8:06 PM

## 2018-10-26 NOTE — ED Notes (Signed)
Pt very difficult to deal with he wants things just as he wants it and no way else

## 2018-10-26 NOTE — ED Triage Notes (Signed)
Pt has large wound to L foot.  States home health nurse came to his house today to put a machine on foot and it malfunctioned and made his foot worse.

## 2018-10-26 NOTE — ED Provider Notes (Signed)
Emergency Department Provider Note   I have reviewed the triage vital signs and the nursing notes.   HISTORY  Chief Complaint Foot Injury   HPI [name] is a 38 y.o. male with end-stage renal disease, diabetes multiple medical problems presents the emergency department today after being discharged from the hospital yesterday.  Patient states he was admitted because of a wound on his foot had a debridement by Dr. March Rummage and was sent home with a wound VAC and placed on oral antibiotics and oral pain medications.  Patient states that the home health nurse arrived today and the wound VAC was set to a pressure of 0.  She took off the wound VAC and noticed that his toe was black and that he had significant maceration of skin and erythema around the wound so she called the doctor's office, Dr. March Rummage, who did the debridement and they said to send him to the emergency room for further evaluation.  Patient has no other complaints.  No fevers.  Some decreased appetite but no nausea or vomiting.  Had dialysis today. No other associated or modifying symptoms.    Past Medical History:  Diagnosis Date  . Diabetes mellitus without complication ([place])   . Diabetic gastroparesis ([place])   . Dialysis patient ([place])   . Hypertension   . Renal disorder    Dialysis  . Sepsis [facility])     Patient Active Problem List   Diagnosis Date Noted  . Diabetic ulcer of toe of left foot associated with diabetes mellitus due to underlying condition, with necrosis of muscle ([place])   . Nausea vomiting and diarrhea 10/20/2018  . Septic arthritis of left foot ([place])   . DM (diabetes mellitus), secondary, uncontrolled, with neurologic complications ([place])   . Osteomyelitis of foot, left, acute ([place])   . Acute osteomyelitis of left ankle or foot ([place])   . Osteomyelitis of fifth toe of left foot ([place])   . Anemia of chronic disease 08/18/2018  . Osteomyelitis ([place]) 02/01/2018  . Osteomyelitis of ankle or foot, acute,  right ([place]) 10/30/2017  . Cellulitis and abscess of foot   . Diabetic foot infection ([place])   . Diabetes mellitus type 1 ([place]) 10/25/2017  . Diabetic gastroparesis ([place]) 10/25/2017  . Sepsis ([place]) 10/25/2017  . HTN (hypertension) 08/09/2014  . ESRD (end stage renal disease) on dialysis ([place]) 03/19/2014  . Bacteremia due to coagulase-negative Staphylococcus 05/31/2013  . Metabolic acidosis 59/16/3846    Past Surgical History:  Procedure Laterality Date  . AMPUTATION Right 02/02/2018   Procedure: RIGHT FIFTH TOE AND METATARSAL AMPUTATION. Filetted toe flap metatarsal resection. Debridement Plantar Foot wound;  Surgeon: Evelina Bucy, DPM;  Location: [place];  Service: Podiatry;  Laterality: Right;  . AMPUTATION Left 08/20/2018   Procedure: FIFTH METATARSAL BONE BIOPSY;  Surgeon: Evelina Bucy, DPM;  Location: [place];  Service: Podiatry;  Laterality: Left;  . APPLICATION OF WOUND VAC  02/02/2018   Procedure: APPLICATION OF WOUND VAC  Right Foot;  Surgeon: Evelina Bucy, DPM;  Location: [place];  Service: Podiatry;;  . AV FISTULA PLACEMENT     left arm.  . AV FISTULA PLACEMENT Right 12/22/2016   Procedure: INSERTION OF ARTERIOVENOUS (AV) GORE-TEX GRAFT ARM;  Surgeon: Elam Dutch, MD;  Location: [facility] OR;  Service: Vascular;  Laterality: Right;  . AV FISTULA PLACEMENT Left 05/26/2018   Procedure: INSERTION OF  ARTERIOVENOUS (AV) GORE-TEX GRAFT LEFT ARM;  Surgeon: Serafina Mitchell, MD;  Location:  [facility] OR;  Service: Vascular;  Laterality: Left;  . EYE SURGERY    . I&D EXTREMITY Right 10/31/2017   Procedure: IRRIGATION AND DEBRIDEMENT RIGHT FOOT;  Surgeon: Evelina Bucy, DPM;  Location: [place];  Service: Podiatry;  Laterality: Right;  . I&D EXTREMITY Left 08/20/2018   Procedure: IRRIGATION AND DEBRIDEMENT EXTREMITY WITH SECONDARY WOUND CLOSUREAND APPLICATION OF WOUND VAC LEFT FOOT;  Surgeon: Evelina Bucy, DPM;  Location: [place];  Service: Podiatry;  Laterality: Left;  . I&D EXTREMITY Left  10/20/2018   Procedure: IRRIGATION AND DEBRIDEMENT LEFT FOOT  DEBRIDEMENT LATERAL FOOT WOUND;  Surgeon: Evelina Bucy, DPM;  Location: [place];  Service: Podiatry;  Laterality: Left;  . INSERTION OF DIALYSIS CATHETER     Right subclavian  . IR AV DIALY SHUNT INTRO NEEDLE/INTRACATH INITIAL W/PTA/IMG RIGHT Right 02/05/2018  . IR THROMBECTOMY AV FISTULA W/THROMBOLYSIS/PTA INC/SHUNT/IMG LEFT Left 08/24/2018  . IR US GUIDE VASC ACCESS LEFT  08/24/2018  . IR US GUIDE VASC ACCESS RIGHT  02/05/2018  . IRRIGATION AND DEBRIDEMENT FOOT Right 10/23/2018   Procedure: Irrigation and Debridement to tendon, Left Foot;  Surgeon: Evelina Bucy, DPM;  Location: [place];  Service: Podiatry;  Laterality: Right;  . TRANSMETATARSAL AMPUTATION N/A 08/18/2018   Procedure: IRRIGATION AND DEBRIDEMENT OF LEFT 5TH TOE AND TRANSMETATARSAL, WITH PARTICAL LEFT 5TH TOE AND METATARSAL AMPUTATION, BONE BIOPSY, WOUND VAC APPLICATION.;  Surgeon: Evelina Bucy, DPM;  Location: [place];  Service: Podiatry;  Laterality: N/A;  . UPPER EXTREMITY VENOGRAPHY N/A 11/16/2016   Procedure: Upper Extremity Venography - Right Central;  Surgeon: Elam Dutch, MD;  Location: [place] [facility];  Service: Cardiovascular;  Laterality: N/A;  . UPPER EXTREMITY VENOGRAPHY N/A 05/25/2018   Procedure: UPPER EXTREMITY VENOGRAPHY - Bilateral;  Surgeon: Marty Heck, MD;  Location: [place] [facility];  Service: Cardiovascular;  Laterality: N/A;    Current Outpatient Rx  . Order #: [id number] Class: Normal  . Order #: [id number] Class: Historical Med  . Order #: [id number] Class: Historical Med  . Order #: [id number] Class: Normal  . Order #: [id number] Class: Normal  . Order #: [id number] Class: Historical Med  . Order #: [id number] Class: Normal  . Order #: [id number] Class: Historical Med  . Order #: [id number] Class: No Print  . Order #: [id number] Class: Normal  . Order #: [id number] Class: Print  . Order #: [id number] Class: Historical Med     Allergies Patient has no known allergies.  Family History  Problem Relation Age of Onset  . Diabetes Mellitus II Other   . Diabetes Father   . Renal Disease Father        ESRD    Social History Social History   Tobacco Use  . Smoking status: Never Smoker  . Smokeless tobacco: Never Used  Substance Use Topics  . Alcohol use: No  . Drug use: No    Review of Systems  All other systems negative except as documented in the HPI. All pertinent positives and negatives as reviewed in the HPI. ____________________________________________   PHYSICAL EXAM:  VITAL SIGNS: ED Triage Vitals  Enc Vitals Group     BP 10/26/18 1619 126/79     Pulse Rate 10/26/18 1619 (!) 125     Resp 10/26/18 1619 18     Temp 10/26/18 1619 99.8 F (37.7 C)     Temp Source 10/26/18 1619 Oral     SpO2 10/26/18 1619 95 %    Constitutional: Alert and oriented. Well appearing and in no acute distress. Eyes:  Conjunctivae are normal. PERRL. EOMI. Head: Atraumatic. Nose: No congestion/rhinnorhea. Mouth/Throat: Mucous membranes are moist.  Oropharynx non-erythematous. Neck: No stridor.  No meningeal signs.   Cardiovascular: tachycardic rate, regular rhythm. Good peripheral circulation. Grossly normal heart sounds.   Respiratory: Normal respiratory effort.  No retractions. Lungs CTAB. Gastrointestinal: Soft and nontender. No distention.  Musculoskeletal: No lower extremity tenderness nor edema. No gross deformities of extremities. Neurologic:  Normal speech and language. No gross focal neurologic deficits are appreciated.  Skin:  See pictures below          ____________________________________________   LABS (all labs ordered are listed, but only abnormal results are displayed)  Labs Reviewed  LACTIC ACID, PLASMA - Abnormal; Notable for the following components:      Result Value   Lactic Acid, Venous 2.6 (*)    All other components within normal limits  LACTIC ACID, PLASMA -  Abnormal; Notable for the following components:   Lactic Acid, Venous 2.0 (*)    All other components within normal limits  COMPREHENSIVE METABOLIC PANEL - Abnormal; Notable for the following components:   Sodium 132 (*)    Chloride 91 (*)    Glucose, Bld 203 (*)    Creatinine, Ser 5.83 (*)    Albumin 2.5 (*)    GFR calc non Af Amer 11 (*)    GFR calc Af Amer 13 (*)    All other components within normal limits  CBC WITH DIFFERENTIAL/PLATELET - Abnormal; Notable for the following components:   WBC 13.4 (*)    RBC 3.44 (*)    Hemoglobin 10.0 (*)    HCT 33.3 (*)    Neutro Abs 11.0 (*)    Abs Immature Granulocytes 0.20 (*)    All other components within normal limits  BASIC METABOLIC PANEL - Abnormal; Notable for the following components:   Sodium 132 (*)    Chloride 91 (*)    Glucose, Bld 405 (*)    Creatinine, Ser 7.01 (*)    GFR calc non Af Amer 9 (*)    GFR calc Af Amer 10 (*)    All other components within normal limits  CBC - Abnormal; Notable for the following components:   RBC 3.19 (*)    Hemoglobin 9.4 (*)    HCT 30.8 (*)    All other components within normal limits  GLUCOSE, CAPILLARY - Abnormal; Notable for the following components:   Glucose-Capillary 312 (*)    All other components within normal limits  GLUCOSE, CAPILLARY - Abnormal; Notable for the following components:   Glucose-Capillary 446 (*)    All other components within normal limits  GLUCOSE, CAPILLARY - Abnormal; Notable for the following components:   Glucose-Capillary 287 (*)    All other components within normal limits  GLUCOSE, CAPILLARY - Abnormal; Notable for the following components:   Glucose-Capillary 150 (*)    All other components within normal limits  CBG MONITORING, ED - Abnormal; Notable for the following components:   Glucose-Capillary 274 (*)    All other components within normal limits  CULTURE, BLOOD (ROUTINE X 2)  CULTURE, BLOOD (ROUTINE X 2)  MRSA PCR SCREENING    ____________________________________________  EKG   EKG Interpretation  Date/Time:    Ventricular Rate:    PR Interval:    QRS Duration:   QT Interval:    QTC Calculation:   R Axis:     Text Interpretation:         ____________________________________________  RADIOLOGY  No  results found.  ____________________________________________   PROCEDURES  Procedure(s) performed:   Procedures   ____________________________________________   INITIAL IMPRESSION / ASSESSMENT AND PLAN / ED COURSE  Compared to previous pictures it appears that the wound is worsening and he has worsening erythema around it is also spreading up his leg.  Concern for possible worsening osteomyelitis.  We will plan for consultation with the podiatrist and probably admission for further management.  Dr. March Rummage will see. Recommending broad spectrum antibiotics. Vanc/cefepime started. Admit to medicine.      Pertinent labs & imaging results that were available during my care of the patient were reviewed by me and considered in my medical decision making (see chart for details).  ____________________________________________  FINAL CLINICAL IMPRESSION(S) / ED DIAGNOSES  Final diagnoses:  None     MEDICATIONS GIVEN DURING THIS VISIT:  Medications  cinacalcet (SENSIPAR) tablet 30 mg (30 mg Oral Given 10/27/18 1645)  clonazePAM (KLONOPIN) tablet 0.5 mg (has no administration in time range)  insulin glargine (LANTUS) injection 15 Units (15 Units Subcutaneous Not Given 10/26/18 2200)  multivitamin (RENA-VIT) tablet 1 tablet (1 tablet Oral Given 10/26/18 2159)  calcitRIOL (ROCALTROL) capsule 2.5 mcg (has no administration in time range)  HYDROcodone-acetaminophen (NORCO/VICODIN) 5-325 MG per tablet 1-2 tablet (2 tablets Oral Given 10/27/18 2013)  sodium chloride flush (NS) 0.9 % injection 3 mL (3 mLs Intravenous Given 10/27/18 1026)  sodium chloride flush (NS) 0.9 % injection 3 mL (has no  administration in time range)  0.9 %  sodium chloride infusion (has no administration in time range)  ondansetron (ZOFRAN) tablet 4 mg (has no administration in time range)    Or  ondansetron (ZOFRAN) injection 4 mg (has no administration in time range)  sevelamer carbonate (RENVELA) tablet 2,400 mg (2,400 mg Oral Given 10/27/18 1645)  insulin aspart (novoLOG) injection 0-5 Units (has no administration in time range)  insulin aspart (novoLOG) injection 0-9 Units (1 Units Subcutaneous Given 10/27/18 1645)  ampicillin-sulbactam (UNASYN) 1.5 g in sodium chloride 0.9 % 100 mL IVPB (1.5 g Intravenous New Bag/Given 10/27/18 1025)  Darbepoetin Alfa (ARANESP) injection 100 mcg (has no administration in time range)  ferric gluconate (NULECIT) 62.5 mg in sodium chloride 0.9 % 100 mL IVPB (has no administration in time range)  Chlorhexidine Gluconate Cloth 2 % PADS 6 each (has no administration in time range)  fentaNYL (SUBLIMAZE) injection 50 mcg (50 mcg Intramuscular Given 10/26/18 1807)  sodium chloride 0.9 % bolus 500 mL (0 mLs Intravenous Stopped 10/26/18 1929)  vancomycin (VANCOCIN) 1,500 mg in sodium chloride 0.9 % 500 mL IVPB (0 mg Intravenous Stopped 10/26/18 2316)  ceFEPIme (MAXIPIME) 1 g in sodium chloride 0.9 % 100 mL IVPB (0 g Intravenous Stopped 10/26/18 2007)  HYDROmorphone (DILAUDID) injection 1 mg (1 mg Intravenous Given 10/26/18 1903)  insulin aspart (novoLOG) injection 8 Units (8 Units Subcutaneous Given 10/27/18 0218)     NEW OUTPATIENT MEDICATIONS STARTED DURING THIS VISIT:  Current Discharge Medication List      Note:  This note was prepared with assistance of Dragon voice recognition software. Occasional wrong-word or sound-a-like substitutions may have occurred due to the inherent limitations of voice recognition software.   Merrily Pew, MD 10/27/18 2020

## 2018-10-26 NOTE — H&P (Signed)
History and Physical    [name] DGU:[id number] DOB: [birth year] DOA: 10/26/2018  PCP: Patient, No Pcp Per  Patient coming from: Home  Chief Complaint: Foot red  HPI: [name] is a 38 y.o. male with medical history significant of end-stage renal disease dialysis Monday Wednesday Friday last dialysis today, chronic bilateral foot wounds, diabetes with recent just discharged yesterday with a wound VAC and on oral antibiotics.  He has been seen by Dr. Ralene Bathe with podiatry.  Today when the wound care nurse went his wound VAC was not hooked up appropriately when she removed it some skin peeled off and it was very red.  Patient was then therefore sent to the emergency department for infection of his foot again.  Podiatry called who are stating that he would likely need amputation which has been offered in the past but the patient has been reluctant to do.  Patient is being referred for admission for diabetic foot infection.   Review of Systems: As per HPI otherwise 10 point review of systems negative.   Past Medical History:  Diagnosis Date  . Diabetes mellitus without complication ([place])   . Diabetic gastroparesis ([place])   . Dialysis patient ([place])   . Hypertension   . Renal disorder    Dialysis  . Sepsis [facility])     Past Surgical History:  Procedure Laterality Date  . AMPUTATION Right 02/02/2018   Procedure: RIGHT FIFTH TOE AND METATARSAL AMPUTATION. Filetted toe flap metatarsal resection. Debridement Plantar Foot wound;  Surgeon: Evelina Bucy, DPM;  Location: [place];  Service: Podiatry;  Laterality: Right;  . AMPUTATION Left 08/20/2018   Procedure: FIFTH METATARSAL BONE BIOPSY;  Surgeon: Evelina Bucy, DPM;  Location: [place];  Service: Podiatry;  Laterality: Left;  . APPLICATION OF WOUND VAC  02/02/2018   Procedure: APPLICATION OF WOUND VAC  Right Foot;  Surgeon: Evelina Bucy, DPM;  Location: [place];  Service: Podiatry;;  . AV FISTULA PLACEMENT     left arm.  . AV FISTULA  PLACEMENT Right 12/22/2016   Procedure: INSERTION OF ARTERIOVENOUS (AV) GORE-TEX GRAFT ARM;  Surgeon: Elam Dutch, MD;  Location: [facility] OR;  Service: Vascular;  Laterality: Right;  . AV FISTULA PLACEMENT Left 05/26/2018   Procedure: INSERTION OF  ARTERIOVENOUS (AV) GORE-TEX GRAFT LEFT ARM;  Surgeon: Serafina Mitchell, MD;  Location: [place];  Service: Vascular;  Laterality: Left;  . EYE SURGERY    . I&D EXTREMITY Right 10/31/2017   Procedure: IRRIGATION AND DEBRIDEMENT RIGHT FOOT;  Surgeon: Evelina Bucy, DPM;  Location: [place];  Service: Podiatry;  Laterality: Right;  . I&D EXTREMITY Left 08/20/2018   Procedure: IRRIGATION AND DEBRIDEMENT EXTREMITY WITH SECONDARY WOUND CLOSUREAND APPLICATION OF WOUND VAC LEFT FOOT;  Surgeon: Evelina Bucy, DPM;  Location: [place];  Service: Podiatry;  Laterality: Left;  . I&D EXTREMITY Left 10/20/2018   Procedure: IRRIGATION AND DEBRIDEMENT LEFT FOOT  DEBRIDEMENT LATERAL FOOT WOUND;  Surgeon: Evelina Bucy, DPM;  Location: [place];  Service: Podiatry;  Laterality: Left;  . INSERTION OF DIALYSIS CATHETER     Right subclavian  . IR AV DIALY SHUNT INTRO NEEDLE/INTRACATH INITIAL W/PTA/IMG RIGHT Right 02/05/2018  . IR THROMBECTOMY AV FISTULA W/THROMBOLYSIS/PTA INC/SHUNT/IMG LEFT Left 08/24/2018  . IR US GUIDE VASC ACCESS LEFT  08/24/2018  . IR US GUIDE VASC ACCESS RIGHT  02/05/2018  . IRRIGATION AND DEBRIDEMENT FOOT Right 10/23/2018   Procedure: Irrigation and Debridement to tendon, Left Foot;  Surgeon:  Evelina Bucy, DPM;  Location: [place];  Service: Podiatry;  Laterality: Right;  . TRANSMETATARSAL AMPUTATION N/A 08/18/2018   Procedure: IRRIGATION AND DEBRIDEMENT OF LEFT 5TH TOE AND TRANSMETATARSAL, WITH PARTICAL LEFT 5TH TOE AND METATARSAL AMPUTATION, BONE BIOPSY, WOUND VAC APPLICATION.;  Surgeon: Evelina Bucy, DPM;  Location: [place];  Service: Podiatry;  Laterality: N/A;  . UPPER EXTREMITY VENOGRAPHY N/A 11/16/2016   Procedure: Upper Extremity Venography - Right  Central;  Surgeon: Elam Dutch, MD;  Location: [place] CV LAB;  Service: Cardiovascular;  Laterality: N/A;  . UPPER EXTREMITY VENOGRAPHY N/A 05/25/2018   Procedure: UPPER EXTREMITY VENOGRAPHY - Bilateral;  Surgeon: Marty Heck, MD;  Location: [place] CV LAB;  Service: Cardiovascular;  Laterality: N/A;     reports that he has never smoked. He has never used smokeless tobacco. He reports that he does not drink alcohol or use drugs.  No Known Allergies  Family History  Problem Relation Age of Onset  . Diabetes Mellitus II Other   . Diabetes Father   . Renal Disease Father        ESRD    Prior to Admission medications   Medication Sig Start Date End Date Taking? Authorizing Provider  doxycycline (VIBRA-TABS) 100 MG tablet Take 1 tablet (100 mg total) by mouth 2 (two) times daily for 14 days. 10/25/18 11/08/18 Yes Hongalgi, Lenis Dickinson, MD  calcitRIOL (ROCALTROL) 0.5 MCG capsule Take 5 capsules (2.5 mcg total) by mouth every Monday, Wednesday, and Friday with hemodialysis. 10/26/18   Hongalgi, Lenis Dickinson, MD  cinacalcet (SENSIPAR) 30 MG tablet Take 30 mg by mouth daily with supper.    [provider]  clonazePAM (KLONOPIN) 0.5 MG tablet Take 0.5 mg by mouth daily as needed for anxiety (sleep).  07/21/17   [provider]  collagenase (SANTYL) ointment Apply 1 application topically daily. 08/23/18   Elgergawy, Silver Huguenin, MD  glucose blood ([employer] TEST STRIPS) test strip by Other route 4 (four) times a day as needed. 10/29/16   [provider]  HYDROcodone-acetaminophen (NORCO/VICODIN) 5-325 MG tablet Take 1-2 tablets by mouth every 6 (six) hours as needed for moderate pain or severe pain. 10/25/18   Hongalgi, Lenis Dickinson, MD  insulin aspart (NOVOLOG) 100 UNIT/ML injection Inject 6-7 Units into the skin 2 (two) times daily with a meal.     [provider]  insulin glargine (LANTUS) 100 UNIT/ML injection Inject 0.15 mLs (15 Units total) into the skin at  bedtime. 08/23/18   Elgergawy, Silver Huguenin, MD  multivitamin (RENA-VIT) TABS tablet Take 1 tablet by mouth at bedtime. 10/24/18   Hongalgi, Lenis Dickinson, MD  ondansetron (ZOFRAN ODT) 4 MG disintegrating tablet Take 1 tablet (4 mg total) by mouth every 8 (eight) hours as needed for nausea or vomiting. 11/23/17   Ward, Ozella Almond, PA-C  sevelamer carbonate (RENVELA) 800 MG tablet Take 2,400-3,200 mg by mouth See admin instructions. Take 3 - 4 tablets (2400 mg -3200 mg) by mouth two times daily with meals    [provider]    Physical Exam: Vitals:   10/26/18 1619 10/26/18 1645 10/26/18 1700  BP: 126/79 (!) 115/54 130/72  Pulse: (!) 125 (!) 112 (!) 103  Resp: 18    Temp: 99.8 F (37.7 C)    TempSrc: Oral    SpO2: 95% 100% 100%      Constitutional: NAD, calm, comfortable Vitals:   10/26/18 1619 10/26/18 1645 10/26/18 1700  BP: 126/79 (!) 115/54 130/72  Pulse: (!) 125 (!) 112 (!) 103  Resp: 18    Temp: 99.8 F (37.7 C)    TempSrc: Oral    SpO2: 95% 100% 100%   Eyes: PERRL, lids and conjunctivae normal ENMT: Mucous membranes are moist. Posterior pharynx clear of any exudate or lesions.Normal dentition.  Neck: normal, supple, no masses, no thyromegaly Respiratory: clear to auscultation bilaterally, no wheezing, no crackles. Normal respiratory effort. No accessory muscle use.  Cardiovascular: Regular rate and rhythm, no murmurs / rubs / gallops. No extremity edema. 2+ pedal pulses. No carotid bruits.  Abdomen: no tenderness, no masses palpated. No hepatosplenomegaly. Bowel sounds positive.  Musculoskeletal: no clubbing / cyanosis. No joint deformity upper and lower extremities. Good ROM, no contractures. Normal muscle tone.  Skin: no rashes, lesions, ulcers. No induration except open wound to right foot with surrounding erythema with no induration or fluctuance consistent with cellulitis Neurologic: CN 2-12 grossly intact. Sensation intact, DTR normal. Strength 5/5 in all 4.    Psychiatric: Normal judgment and insight. Alert and oriented x 3. Normal mood.    Labs on Admission: I have personally reviewed following labs and imaging studies  CBC: Recent Labs  Lab 10/20/18 1013 10/20/18 1350 10/21/18 1022 10/22/18 1233 10/24/18 0826 10/26/18 1716  WBC 26.3*  --  22.7* 17.8* 3.9* 13.4*  NEUTROABS  --   --   --   --   --  11.0*  HGB 12.8* 16.3 11.5* 11.4* 11.2* 10.0*  HCT 41.1 48.0 36.1* 36.6* 36.2* 33.3*  MCV 95.1  --  93.5 93.4 93.5 96.8  PLT 166  --  151 191 153 428   Basic Metabolic Panel: Recent Labs  Lab 10/20/18 1013 10/20/18 1350 10/21/18 1022 10/22/18 1233 10/24/18 0826 10/26/18 1716  NA 130* 127* 132* 130* 131* 132*  K 4.3 4.5 3.0* 3.3* 3.5 3.8  CL 89*  --  94* 89* 95* 91*  CO2 25  --  21* 26 21* 26  GLUCOSE 210*  --  250* 141* 296* 203*  BUN 31*  --  21* 35* 41* 13  CREATININE 8.82*  --  5.72* 8.14* 9.39* 5.83*  CALCIUM 9.4  --  8.6* 8.9 8.4* 9.4  PHOS  --   --   --   --  3.7  --    GFR: Estimated Creatinine Clearance: 16.7 mL/min (A) (by C-G formula based on SCr of 5.83 mg/dL (H)). Liver Function Tests: Recent Labs  Lab 10/20/18 1013 10/24/18 0826 10/26/18 1716  AST 23  --  23  ALT 14  --  5  ALKPHOS 162*  --  112  BILITOT 1.3*  --  0.7  PROT 7.5  --  7.3  ALBUMIN 2.8* 2.2* 2.5*   Recent Labs  Lab 10/20/18 1013  LIPASE 28   No results for input(s): AMMONIA in the last 168 hours. Coagulation Profile: No results for input(s): INR, PROTIME in the last 168 hours. Cardiac Enzymes: No results for input(s): CKTOTAL, CKMB, CKMBINDEX, TROPONINI in the last 168 hours. BNP (last 3 results) No results for input(s): PROBNP in the last 8760 hours. HbA1C: No results for input(s): HGBA1C in the last 72 hours. CBG: Recent Labs  Lab 10/24/18 2229 10/25/18 0650 10/25/18 1208 10/25/18 1725 10/26/18 1814  GLUCAP 146* 277* 248* 138* 274*   Lipid Profile: No results for input(s): CHOL, HDL, LDLCALC, TRIG, CHOLHDL, LDLDIRECT  in the last 72 hours. Thyroid Function Tests: No results for input(s): TSH, T4TOTAL, FREET4, T3FREE, THYROIDAB in the last  72 hours. Anemia Panel: No results for input(s): VITAMINB12, FOLATE, FERRITIN, TIBC, IRON, RETICCTPCT in the last 72 hours. Urine analysis:    Component Value Date/Time   COLORURINE YELLOW 05/26/2015 [place] 05/26/2015 1115   APPEARANCEUR [place] 07/30/2014 2303   LABSPEC 1.016 05/26/2015 1115   LABSPEC 1.012 07/30/2014 2303   PHURINE 8.0 05/26/2015 1115   GLUCOSEU 500 (A) 05/26/2015 1115   GLUCOSEU >=500 07/30/2014 2303   HGBUR MODERATE (A) 05/26/2015 1115   BILIRUBINUR NEGATIVE 05/26/2015 1115   BILIRUBINUR Negative 07/30/2014 2303   KETONESUR 15 (A) 05/26/2015 1115   PROTEINUR >300 (A) 05/26/2015 1115   UROBILINOGEN 0.2 05/26/2015 1115   NITRITE NEGATIVE 05/26/2015 1115   LEUKOCYTESUR NEGATIVE 05/26/2015 1115   LEUKOCYTESUR Negative 07/30/2014 2303   Sepsis Labs: !!!!!!!!!!!!!!!!!!!!!!!!!!!!!!!!!!!!!!!!!!!! @LABRCNTIP (procalcitonin:4,lacticidven:4) ) Recent Results (from the past 240 hour(s))  WOUND CULTURE     Status: Abnormal   Collection Time: 10/19/18  3:15 PM  Result Value Ref Range Status   MICRO NUMBER: [id number]  Final   SPECIMEN QUALITY: Adequate  Final   SOURCE: LEFT FOOT LATERAL  Final   STATUS: FINAL  Final   GRAM STAIN:   Final    No white blood cells seen No epithelial cells seen Few Gram positive cocci in pairs   ISOLATE 1: Staphylococcus aureus (A)  Final    Comment: Heavy growth of Staphylococcus aureus      Susceptibility   Staphylococcus aureus - AEROBIC CULT, GRAM STAIN POSITIVE 1    VANCOMYCIN <=0.5 Sensitive     CIPROFLOXACIN <=0.5 Sensitive     CLINDAMYCIN <=0.25 Sensitive     LEVOFLOXACIN <=0.12 Sensitive     ERYTHROMYCIN <=0.25 Sensitive     GENTAMICIN <=0.5 Sensitive     OXACILLIN* <=0.25 Sensitive      * Oxacillin-susceptible staphylococci aresusceptible to other penicillinase-stablepenicillins (e.g.  Methicillin, Nafcillin), beta-lactam/beta-lactamase inhibitor combinations, andcephems with staphylococcal indications, includingCefazolin.    TETRACYCLINE <=1 Sensitive     TRIMETH/SULFA* <=10 Sensitive      * Oxacillin-susceptible staphylococci aresusceptible to other penicillinase-stablepenicillins (e.g. Methicillin, Nafcillin), beta-lactam/beta-lactamase inhibitor combinations, andcephems with staphylococcal indications, includingCefazolin.Legend:S = Susceptible  I = IntermediateR = Resistant  NS = Not susceptible* = Not tested  NR = Not reported**NN = See antimicrobic comments  Blood Culture (routine x 2)     Status: None   Collection Time: 10/20/18 12:13 PM  Result Value Ref Range Status   Specimen Description BLOOD RIGHT ANTECUBITAL  Final   Special Requests   Final    BOTTLES DRAWN AEROBIC AND ANAEROBIC Blood Culture adequate volume   Culture   Final    NO GROWTH 5 DAYS Performed at [facility], [place] [address], [place], [place] [address]    Report Status 10/25/2018 FINAL  Final  Gastrointestinal Panel by PCR , Stool     Status: None   Collection Time: 10/20/18 12:26 PM  Result Value Ref Range Status   Campylobacter species NOT DETECTED NOT DETECTED Final   Plesimonas shigelloides NOT DETECTED NOT DETECTED Final   Salmonella species NOT DETECTED NOT DETECTED Final   Yersinia enterocolitica NOT DETECTED NOT DETECTED Final   Vibrio species NOT DETECTED NOT DETECTED Final   Vibrio cholerae NOT DETECTED NOT DETECTED Final   Enteroaggregative E coli (EAEC) NOT DETECTED NOT DETECTED Final   Enteropathogenic E coli (EPEC) NOT DETECTED NOT DETECTED Final   Enterotoxigenic E coli (ETEC) NOT DETECTED NOT DETECTED Final   Shiga like toxin producing E coli (  STEC) NOT DETECTED NOT DETECTED Final   Shigella/Enteroinvasive E coli (EIEC) NOT DETECTED NOT DETECTED Final   Cryptosporidium NOT DETECTED NOT DETECTED Final   Cyclospora cayetanensis NOT DETECTED NOT DETECTED Final   Entamoeba  histolytica NOT DETECTED NOT DETECTED Final   Giardia lamblia NOT DETECTED NOT DETECTED Final   Adenovirus F40/41 NOT DETECTED NOT DETECTED Final   Astrovirus NOT DETECTED NOT DETECTED Final   Norovirus GI/GII NOT DETECTED NOT DETECTED Final   Rotavirus A NOT DETECTED NOT DETECTED Final   Sapovirus (I, II, IV, and V) NOT DETECTED NOT DETECTED Final    Comment: Performed at [facility], [place] [place], [place] [address]  C difficile quick scan w PCR reflex     Status: None   Collection Time: 10/20/18 12:26 PM  Result Value Ref Range Status   C Diff antigen NEGATIVE NEGATIVE Final   C Diff toxin NEGATIVE NEGATIVE Final   C Diff interpretation No C. difficile detected.  Final    Comment: Performed at [facility], [place] [address] [place], [place] [address]  Blood Culture (routine x 2)     Status: None   Collection Time: 10/20/18  1:37 PM  Result Value Ref Range Status   Specimen Description BLOOD RIGHT HAND  Final   Special Requests   Final    BOTTLES DRAWN AEROBIC AND ANAEROBIC Blood Culture adequate volume   Culture   Final    NO GROWTH 5 DAYS Performed at [facility], [place] [address], [place], [place] [address]    Report Status 10/25/2018 FINAL  Final  Aerobic/Anaerobic Culture (surgical/deep wound)     Status: None   Collection Time: 10/20/18  8:37 PM  Result Value Ref Range Status   Specimen Description ABSCESS LEFT FOOT  Final   Special Requests NONE  Final   Gram Stain   Final    FEW WBC PRESENT, PREDOMINANTLY MONONUCLEAR RARE GRAM POSITIVE COCCI    Culture   Final    MODERATE STAPHYLOCOCCUS AUREUS NO ANAEROBES ISOLATED Performed at [facility], [place] [address], [place], [place] [address]    Report Status 10/25/2018 FINAL  Final   Organism ID, Bacteria STAPHYLOCOCCUS AUREUS  Final      Susceptibility   Staphylococcus aureus - MIC*    CIPROFLOXACIN <=0.5 SENSITIVE Sensitive     ERYTHROMYCIN <=0.25 SENSITIVE Sensitive      GENTAMICIN <=0.5 SENSITIVE Sensitive     OXACILLIN <=0.25 SENSITIVE Sensitive     TETRACYCLINE <=1 SENSITIVE Sensitive     VANCOMYCIN <=0.5 SENSITIVE Sensitive     TRIMETH/SULFA <=10 SENSITIVE Sensitive     CLINDAMYCIN <=0.25 SENSITIVE Sensitive     RIFAMPIN <=0.5 SENSITIVE Sensitive     Inducible Clindamycin NEGATIVE Sensitive     * MODERATE STAPHYLOCOCCUS AUREUS     Radiological Exams on Admission: Dg Foot Complete Left  Result Date: 10/26/2018 CLINICAL DATA:  Left foot pain. EXAM: LEFT FOOT - COMPLETE 3+ VIEW COMPARISON:  Radiographs of October 20, 2018. FINDINGS: Status post surgical resection of most of the fifth metatarsal. Vascular calcifications are noted. No acute fracture or dislocation is noted. There remains indistinct margins involving the residual portion of the fifth metatarsal as described on prior exam. IMPRESSION: Postsurgical changes as described above. Residual irregularity involving the surgical margins of the residual portion of fifth metatarsal as described on prior exam. The possibility of osteomyelitis can not be excluded. Electronically Signed   By: Marijo Conception,  M.D.   On: 10/26/2018 17:49   Old chart reviewed Case discussed with EDP  Assessment/Plan 38 year old male with diabetic foot infection Principal Problem:   Cellulitis-placed on IV vancomycin and cefepime.  His podiatrist has been called and will see him.  Not septic.  Active Problems:   Diabetic foot infection ([name])-antibiotics as above placed on sliding scale insulin and continue his Lantus    ESRD (end stage renal disease) on dialysis ([name])-patient is due for dialysis on Friday will need to call nephrology before then    Anemia of chronic disease-stable at baseline     DVT prophylaxis: SCDs Code Status: Full Family Communication: None Disposition Plan: Days Consults called: Podiatry Admission status: Admission   Colden Samaras A MD [facility]  If 7PM-7AM, please contact  night-coverage [email or web address] Password [facility]  10/26/2018, 7:18 PM

## 2018-10-26 NOTE — ED Notes (Signed)
Pt has lt foot wound across his lt toes  He reports that he was just here last pm for the same.  He went to dialysis this am and they sent him here  Iv notified  For iv access

## 2018-10-26 NOTE — Progress Notes (Signed)
Report received from ED nurse. Pt. Arrived to unit via stretcher. A/O.  Patient refusing to stick to renal diet. Pt. Is also upset because he stated ED promised him his ham and cheese sandwich, but it never came up. We called ED to ask. Pt. Also noncompliant with care and refuses his meds despite being educated about the importance of them. He requested IV pain meds specifically dilaudid or morphine instead of hydrocodone. Paged NP Blount. Blount stated no IV meds for now. Pt. became upset about this  and requested to speak with the doctor. NP Blount was paged. Awaiting orders.

## 2018-10-26 NOTE — ED Notes (Signed)
Antibiotics are coming from the pharmacy

## 2018-10-26 NOTE — ED Notes (Signed)
Report called to rn on 5 m 

## 2018-10-26 NOTE — Progress Notes (Signed)
Pt. Continuously asking IV pain meds. Pt wanted to speak with provider. Provider notified. No new orders at this time.

## 2018-10-26 NOTE — Telephone Encounter (Signed)
[place] states she is at pt's house to begin start of care, pt's wound vac was applied at the hospital and wound vac pressure was set at zero, the left great toe is black and cold, the foot is macerated from the drainage remaining on the skin, and she is not able to take the wound vac dressing off it is pulling the skin off. I asked Ronny Bacon to ask pt if he was going to [facility] ED. Pt began to speak on Natasha's phone, he stated he had just gotten out of the hospital and this was their fault, he had nothing to do with it and he had just had dialysis and had not eaten and he wanted me to call [facility] and tell them he would be there, he had something to do and he didn't want to have to wait all day, because he didn't do anything, and the last time he went to [facility] ED he didn't get there when he told them he would be there and they called him and had a bed ready. I told pt once I spoke to [facility] and made sure she didn't need anything from me to help him, I would call [facility] ED. Ronny Bacon states there is really no way for her to remove the wound vac it is pulling off the skin.

## 2018-10-26 NOTE — Telephone Encounter (Signed)
I called [place] and informed Claiborne Billings, Triage nurse, of pt's status and that he would be on the way to the ED in an hour.

## 2018-10-26 NOTE — ED Notes (Signed)
Ordered diet tray for pt  

## 2018-10-26 NOTE — Telephone Encounter (Signed)
Dr. March Rummage called and I informed that pt was going to [facility] ED with left great toe black and cold, foot macerated due to wound vac pressure set to zero and maceration beneath the wound vac dressing would not allow the dressing to be removed.

## 2018-10-26 NOTE — ED Notes (Signed)
The pt refused to have the im injection

## 2018-10-26 NOTE — Telephone Encounter (Signed)
[place] states she will page Dr. March Rummage.

## 2018-10-27 ENCOUNTER — Inpatient Hospital Stay (HOSPITAL_COMMUNITY): Payer: Medicare Other

## 2018-10-27 ENCOUNTER — Encounter: Payer: Medicare Other | Admitting: Podiatry

## 2018-10-27 DIAGNOSIS — L97523 Non-pressure chronic ulcer of other part of left foot with necrosis of muscle: Secondary | ICD-10-CM

## 2018-10-27 DIAGNOSIS — L03119 Cellulitis of unspecified part of limb: Secondary | ICD-10-CM

## 2018-10-27 DIAGNOSIS — E11621 Type 2 diabetes mellitus with foot ulcer: Secondary | ICD-10-CM

## 2018-10-27 DIAGNOSIS — L02619 Cutaneous abscess of unspecified foot: Secondary | ICD-10-CM

## 2018-10-27 DIAGNOSIS — M86172 Other acute osteomyelitis, left ankle and foot: Secondary | ICD-10-CM

## 2018-10-27 DIAGNOSIS — N186 End stage renal disease: Secondary | ICD-10-CM

## 2018-10-27 DIAGNOSIS — L97529 Non-pressure chronic ulcer of other part of left foot with unspecified severity: Secondary | ICD-10-CM

## 2018-10-27 DIAGNOSIS — Z992 Dependence on renal dialysis: Secondary | ICD-10-CM

## 2018-10-27 DIAGNOSIS — D638 Anemia in other chronic diseases classified elsewhere: Secondary | ICD-10-CM

## 2018-10-27 LAB — BASIC METABOLIC PANEL
Anion gap: 12 (ref 5–15)
BUN: 20 mg/dL (ref 6–20)
CO2: 29 mmol/L (ref 22–32)
Calcium: 9.1 mg/dL (ref 8.9–10.3)
Chloride: 91 mmol/L — ABNORMAL LOW (ref 98–111)
Creatinine, Ser: 7.01 mg/dL — ABNORMAL HIGH (ref 0.61–1.24)
GFR calc Af Amer: 10 mL/min — ABNORMAL LOW (ref >60)
GFR calc non Af Amer: 9 mL/min — ABNORMAL LOW (ref >60)
Glucose, Bld: 405 mg/dL — ABNORMAL HIGH (ref 70–99)
Potassium: 4.1 mmol/L (ref 3.5–5.1)
Sodium: 132 mmol/L — ABNORMAL LOW (ref 135–145)

## 2018-10-27 LAB — CBC
HCT: 30.8 % — ABNORMAL LOW (ref 39.0–52.0)
Hemoglobin: 9.4 g/dL — ABNORMAL LOW (ref 13.0–17.0)
MCH: 29.5 pg (ref 26.0–34.0)
MCHC: 30.5 g/dL (ref 30.0–36.0)
MCV: 96.6 fL (ref 80.0–100.0)
Platelets: 212 10*3/uL (ref 150–400)
RBC: 3.19 MIL/uL — ABNORMAL LOW (ref 4.22–5.81)
RDW: 15.1 % (ref 11.5–15.5)
WBC: 9.6 10*3/uL (ref 4.0–10.5)
nRBC: 0 % (ref 0.0–0.2)

## 2018-10-27 LAB — GLUCOSE, CAPILLARY
Glucose-Capillary: 106 mg/dL — ABNORMAL HIGH (ref 70–99)
Glucose-Capillary: 150 mg/dL — ABNORMAL HIGH (ref 70–99)
Glucose-Capillary: 287 mg/dL — ABNORMAL HIGH (ref 70–99)
Glucose-Capillary: 446 mg/dL — ABNORMAL HIGH (ref 70–99)
Glucose-Capillary: 50 mg/dL — ABNORMAL LOW (ref 70–99)

## 2018-10-27 LAB — MRSA PCR SCREENING: MRSA by PCR: NEGATIVE

## 2018-10-27 MED ORDER — INSULIN ASPART 100 UNIT/ML ~~LOC~~ SOLN
0.0000 [IU] | Freq: Three times a day (TID) | SUBCUTANEOUS | Status: DC
  Administered 2018-10-27: 5 [IU] via SUBCUTANEOUS
  Administered 2018-10-27: 1 [IU] via SUBCUTANEOUS
  Administered 2018-10-28 – 2018-10-29 (×3): 5 [IU] via SUBCUTANEOUS
  Administered 2018-10-29 – 2018-10-31 (×4): 1 [IU] via SUBCUTANEOUS
  Administered 2018-11-03: 3 [IU] via SUBCUTANEOUS
  Administered 2018-11-03: 2 [IU] via SUBCUTANEOUS
  Administered 2018-11-03: 5 [IU] via SUBCUTANEOUS

## 2018-10-27 MED ORDER — DARBEPOETIN ALFA 100 MCG/0.5ML IJ SOSY
100.0000 ug | PREFILLED_SYRINGE | INTRAMUSCULAR | Status: DC
  Administered 2018-10-28: 100 ug via INTRAVENOUS
  Filled 2018-10-27: qty 0.5

## 2018-10-27 MED ORDER — INSULIN ASPART 100 UNIT/ML ~~LOC~~ SOLN
0.0000 [IU] | Freq: Every day | SUBCUTANEOUS | Status: DC
  Administered 2018-11-02: 5 [IU] via SUBCUTANEOUS

## 2018-10-27 MED ORDER — SODIUM CHLORIDE 0.9 % IV SOLN
1.5000 g | Freq: Two times a day (BID) | INTRAVENOUS | Status: DC
  Administered 2018-10-27 – 2018-11-01 (×11): 1.5 g via INTRAVENOUS
  Filled 2018-10-27 (×13): qty 1.5

## 2018-10-27 MED ORDER — SEVELAMER CARBONATE 800 MG PO TABS
2400.0000 mg | ORAL_TABLET | Freq: Three times a day (TID) | ORAL | Status: DC
  Administered 2018-10-27 – 2018-11-03 (×14): 2400 mg via ORAL
  Filled 2018-10-27 (×16): qty 3

## 2018-10-27 MED ORDER — SODIUM CHLORIDE 0.9 % IV SOLN
62.5000 mg | INTRAVENOUS | Status: DC
  Administered 2018-10-28: 62.5 mg via INTRAVENOUS
  Filled 2018-10-27 (×3): qty 5

## 2018-10-27 MED ORDER — CHLORHEXIDINE GLUCONATE CLOTH 2 % EX PADS
6.0000 | MEDICATED_PAD | Freq: Every day | CUTANEOUS | Status: DC
  Administered 2018-10-29: 6 via TOPICAL

## 2018-10-27 MED ORDER — IOHEXOL 300 MG/ML  SOLN
100.0000 mL | Freq: Once | INTRAMUSCULAR | Status: AC | PRN
  Administered 2018-10-27: 100 mL via INTRAVENOUS

## 2018-10-27 MED ORDER — INSULIN ASPART 100 UNIT/ML ~~LOC~~ SOLN
8.0000 [IU] | Freq: Once | SUBCUTANEOUS | Status: AC
  Administered 2018-10-27: 8 [IU] via SUBCUTANEOUS

## 2018-10-27 NOTE — Progress Notes (Addendum)
Inpatient Diabetes Program Recommendations  AACE/ADA: New Consensus Statement on Inpatient Glycemic Control (2015)  Target Ranges:  Prepandial:   less than 140 mg/dL      Peak postprandial:   less than 180 mg/dL (1-2 hours)      Critically ill patients:  140 - 180 mg/dL   Lab Results  Component Value Date   GLUCAP 446 (H) 10/27/2018   HGBA1C 6.7 (H) 10/23/2018     Type 1 DM  Home DM meds: Lantus 15 units q hs                              Novolog 6-7 units bid with meals  Current DM meds: Lantus 15 units q hs                                Novolog sensitive scale (0-9 units) tid and hs   Patient is type 1 therefore does not produce any insulin (needs basal, correction, and meal coverage).   Per MAR in chart, last night patient refused his 15 units of Lantus "patient refused because he didn't get his food".  Paged Dr. Aileen Fass to make aware.  Noted in chart patient has refused to have cbg checked/insulin administered at times. Spoke to bedside RN caring for him this am and he verified patient is refusing to have cbg checked/insulin administered by staff. This makes it very difficult to manage his diabetes.    MD please consider the following:  1. Add Novolog 3 units meal coverage tid (Hold if NPO or if patient eats less than 50% of meal.)  2. Reschedule Lantus 15 units for daily so he can get dose this am (as he refused it last pm)   Thanks.  -- Will follow during hospitalization.--  Jonna Clark RN, MSN Diabetes Coordinator Inpatient Glycemic Control Team Team Pager: [phone] (8am-5pm)

## 2018-10-27 NOTE — Progress Notes (Signed)
RN spoke with [facility] about situation with patient being noncompliant with care, diet and medications. AC is also aware that patient has repeatedly been coming up to nurse's station to ask for IV pain meds, for which the provider was made aware and no new orders were given.  Security was also called due to patient recording staff. Patient had came up to desk to inquire about pain meds again and also about second CBG taken, after he was noncomplaint with diet in which he had family members/ friend bring in food from outside of hospital. CBG was 446. When patient came up to desk, patient began to complain of time it took RN to be notified of CBG, but it had only been approximately 15 minutes since CBG taken and RN was working with another patient. As RN was speaking with patient about CBG and advised patient that she would page the on call provider, patient took out phone and stated that he was recording RN and asked RN to repeat what she had said. RN told pt. He "can't be recording", but patient refused to stop. When RN came to patient's room to advise patient that provider was notified, patient  put phone up to RN's face again and started recording anything that RN said. RN left room and made charge RN aware that patient was recording staff.

## 2018-10-27 NOTE — Consult Note (Signed)
Hospital Consult    Reason for Consult:  Second opinion for left great toe ulceration Referring Physician:  [facility] MRN #:  [id number]  History of Present Illness: This is a 38 y.o. male followed by Dr. March Rummage of podiatry has I&D of his left foot where he had an abscess.  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  On dialysis via upper extremity access.  Was having wound VAC issues and was sent here for further evaluation.  States that he is having pain in his left foot but it denies any fevers or chills.  He is really not interested in talking about amputation at this time.  Past Medical History:  Diagnosis Date  . Diabetes mellitus without complication ([place])   . Diabetic gastroparesis ([place])   . Dialysis patient ([place])   . Hypertension   . Renal disorder    Dialysis  . Sepsis [facility])     Past Surgical History:  Procedure Laterality Date  . AMPUTATION Right 02/02/2018   Procedure: RIGHT FIFTH TOE AND METATARSAL AMPUTATION. Filetted toe flap metatarsal resection. Debridement Plantar Foot wound;  Surgeon: Evelina Bucy, DPM;  Location: [place];  Service: Podiatry;  Laterality: Right;  . AMPUTATION Left 08/20/2018   Procedure: FIFTH METATARSAL BONE BIOPSY;  Surgeon: Evelina Bucy, DPM;  Location: [place];  Service: Podiatry;  Laterality: Left;  . APPLICATION OF WOUND VAC  02/02/2018   Procedure: APPLICATION OF WOUND VAC  Right Foot;  Surgeon: Evelina Bucy, DPM;  Location: [place];  Service: Podiatry;;  . AV FISTULA PLACEMENT     left arm.  . AV FISTULA PLACEMENT Right 12/22/2016   Procedure: INSERTION OF ARTERIOVENOUS (AV) GORE-TEX GRAFT ARM;  Surgeon: Elam Dutch, MD;  Location: [facility] OR;  Service: Vascular;  Laterality: Right;  . AV FISTULA PLACEMENT Left 05/26/2018   Procedure: INSERTION OF  ARTERIOVENOUS (AV) GORE-TEX GRAFT LEFT ARM;  Surgeon: Serafina Mitchell, MD;  Location: [place];  Service: Vascular;  Laterality: Left;  . EYE SURGERY    . I&D EXTREMITY Right  10/31/2017   Procedure: IRRIGATION AND DEBRIDEMENT RIGHT FOOT;  Surgeon: Evelina Bucy, DPM;  Location: [place];  Service: Podiatry;  Laterality: Right;  . I&D EXTREMITY Left 08/20/2018   Procedure: IRRIGATION AND DEBRIDEMENT EXTREMITY WITH SECONDARY WOUND CLOSUREAND APPLICATION OF WOUND VAC LEFT FOOT;  Surgeon: Evelina Bucy, DPM;  Location: [place];  Service: Podiatry;  Laterality: Left;  . I&D EXTREMITY Left 10/20/2018   Procedure: IRRIGATION AND DEBRIDEMENT LEFT FOOT  DEBRIDEMENT LATERAL FOOT WOUND;  Surgeon: Evelina Bucy, DPM;  Location: [place];  Service: Podiatry;  Laterality: Left;  . INSERTION OF DIALYSIS CATHETER     Right subclavian  . IR AV DIALY SHUNT INTRO NEEDLE/INTRACATH INITIAL W/PTA/IMG RIGHT Right 02/05/2018  . IR THROMBECTOMY AV FISTULA W/THROMBOLYSIS/PTA INC/SHUNT/IMG LEFT Left 08/24/2018  . IR US GUIDE VASC ACCESS LEFT  08/24/2018  . IR US GUIDE VASC ACCESS RIGHT  02/05/2018  . IRRIGATION AND DEBRIDEMENT FOOT Right 10/23/2018   Procedure: Irrigation and Debridement to tendon, Left Foot;  Surgeon: Evelina Bucy, DPM;  Location: [place];  Service: Podiatry;  Laterality: Right;  . TRANSMETATARSAL AMPUTATION N/A 08/18/2018   Procedure: IRRIGATION AND DEBRIDEMENT OF LEFT 5TH TOE AND TRANSMETATARSAL, WITH PARTICAL LEFT 5TH TOE AND METATARSAL AMPUTATION, BONE BIOPSY, WOUND VAC APPLICATION.;  Surgeon: Evelina Bucy, DPM;  Location: [place];  Service: Podiatry;  Laterality: N/A;  . UPPER EXTREMITY VENOGRAPHY N/A 11/16/2016  Procedure: Upper Extremity Venography - Right Central;  Surgeon: Elam Dutch, MD;  Location: [place] CV LAB;  Service: Cardiovascular;  Laterality: N/A;  . UPPER EXTREMITY VENOGRAPHY N/A 05/25/2018   Procedure: UPPER EXTREMITY VENOGRAPHY - Bilateral;  Surgeon: Marty Heck, MD;  Location: [place] CV LAB;  Service: Cardiovascular;  Laterality: N/A;    No Known Allergies  Prior to Admission medications   Medication Sig Start Date End Date  Taking? Authorizing Provider  doxycycline (VIBRA-TABS) 100 MG tablet Take 1 tablet (100 mg total) by mouth 2 (two) times daily for 14 days. 10/25/18 11/08/18 Yes Hongalgi, Lenis Dickinson, MD  calcitRIOL (ROCALTROL) 0.5 MCG capsule Take 5 capsules (2.5 mcg total) by mouth every Monday, Wednesday, and Friday with hemodialysis. 10/26/18   Hongalgi, Lenis Dickinson, MD  cinacalcet (SENSIPAR) 30 MG tablet Take 30 mg by mouth daily with supper.    [provider]  clonazePAM (KLONOPIN) 0.5 MG tablet Take 0.5 mg by mouth daily as needed for anxiety (sleep).  07/21/17   [provider]  collagenase (SANTYL) ointment Apply 1 application topically daily. 08/23/18   Elgergawy, Silver Huguenin, MD  glucose blood ([employer] TEST STRIPS) test strip by Other route 4 (four) times a day as needed. 10/29/16   [provider]  HYDROcodone-acetaminophen (NORCO/VICODIN) 5-325 MG tablet Take 1-2 tablets by mouth every 6 (six) hours as needed for moderate pain or severe pain. 10/25/18   Hongalgi, Lenis Dickinson, MD  insulin aspart (NOVOLOG) 100 UNIT/ML injection Inject 6-7 Units into the skin 2 (two) times daily with a meal.     [provider]  insulin glargine (LANTUS) 100 UNIT/ML injection Inject 0.15 mLs (15 Units total) into the skin at bedtime. 08/23/18   Elgergawy, Silver Huguenin, MD  multivitamin (RENA-VIT) TABS tablet Take 1 tablet by mouth at bedtime. 10/24/18   Hongalgi, Lenis Dickinson, MD  ondansetron (ZOFRAN ODT) 4 MG disintegrating tablet Take 1 tablet (4 mg total) by mouth every 8 (eight) hours as needed for nausea or vomiting. 11/23/17   Ward, Ozella Almond, PA-C  sevelamer carbonate (RENVELA) 800 MG tablet Take 2,400-3,200 mg by mouth See admin instructions. Take 3 - 4 tablets (2400 mg -3200 mg) by mouth two times daily with meals    [provider]    Social History   Socioeconomic History  . Marital status: Single    Spouse name: Not on file  . Number of children: Not on file  . Years of education: Not on  file  . Highest education level: Not on file  Occupational History  . Not on file  Social Needs  . Financial resource strain: Not on file  . Food insecurity:    Worry: Not on file    Inability: Not on file  . Transportation needs:    Medical: Not on file    Non-medical: Not on file  Tobacco Use  . Smoking status: Never Smoker  . Smokeless tobacco: Never Used  Substance and Sexual Activity  . Alcohol use: No  . Drug use: No  . Sexual activity: Never  Lifestyle  . Physical activity:    Days per week: Not on file    Minutes per session: Not on file  . Stress: Not on file  Relationships  . Social connections:    Talks on phone: Not on file    Gets together: Not on file    Attends religious service: Not on file    Active member of club or organization:  Not on file    Attends meetings of clubs or organizations: Not on file    Relationship status: Not on file  . Intimate partner violence:    Fear of current or ex partner: Not on file    Emotionally abused: Not on file    Physically abused: Not on file    Forced sexual activity: Not on file  Other Topics Concern  . Not on file  Social History Narrative  . Not on file   Family History  Problem Relation Age of Onset  . Diabetes Mellitus II Other   . Diabetes Father   . Renal Disease Father        ESRD    ROS:  Cardiovascular: []  chest pain/pressure []  palpitations []  SOB lying flat []  DOE []  pain in legs while walking []  pain in legs at rest []  pain in legs at night []  non-healing ulcers []  hx of DVT [x]  swelling in legs  Pulmonary: []  productive cough []  asthma/wheezing []  home O2  Neurologic: []  weakness in []  arms []  legs []  numbness in []  arms []  legs []  hx of CVA []  mini stroke [] difficulty speaking or slurred speech []  temporary loss of vision in one eye []  dizziness  Hematologic: []  hx of cancer []  bleeding problems []  problems with blood clotting easily  Endocrine:   [x]  diabetes []   thyroid disease  GI []  vomiting blood []  blood in stool  GU: []  CKD/renal failure []  HD--[]  M/W/F or []  T/T/S []  burning with urination []  blood in urine  Psychiatric: []  anxiety []  depression  Musculoskeletal: []  arthritis []  joint pain  Integumentary: []  rashes [x]  ulcers  Constitutional: []  fever []  chills   Physical Examination  Vitals:   10/26/18 2033 10/27/18 1126  BP: (!) 155/86 (!) 171/109  Pulse: 97 88  Resp: 19 18  Temp:  98.4 F (36.9 C)  SpO2: 99% 100%   There is no height or weight on file to calculate BMI.  General: No acute distress HENT: WNL, normocephalic Pulmonary: normal non-labored breathing Cardiac: Palpable posterior tibial pulse bilaterally Extremities: Left foot the great toe is black there is exposed tendon proximal to this, there is ulceration of the second and third base toes the fourth toe does appear healthy at this time Neurologic: A&O X 3   CBC    Component Value Date/Time   WBC 9.6 10/27/2018 0426   RBC 3.19 (L) 10/27/2018 0426   HGB 9.4 (L) 10/27/2018 0426   HGB 9.7 (L) 08/22/2014 0447   HCT 30.8 (L) 10/27/2018 0426   HCT 30.0 (L) 08/22/2014 0447   PLT 212 10/27/2018 0426   PLT 158 08/22/2014 0447   MCV 96.6 10/27/2018 0426   MCV 97 08/22/2014 0447   MCH 29.5 10/27/2018 0426   MCHC 30.5 10/27/2018 0426   RDW 15.1 10/27/2018 0426   RDW 15.3 (H) 08/22/2014 0447   LYMPHSABS 1.0 10/26/2018 1716   LYMPHSABS 1.7 08/22/2014 0447   MONOABS 0.9 10/26/2018 1716   MONOABS 0.8 08/22/2014 0447   EOSABS 0.3 10/26/2018 1716   EOSABS 0.4 08/22/2014 0447   BASOSABS 0.0 10/26/2018 1716   BASOSABS 0.1 08/22/2014 0447    BMET    Component Value Date/Time   NA 132 (L) 10/27/2018 0426   NA 138 08/22/2014 0447   K 4.1 10/27/2018 0426   K 4.0 08/22/2014 0447   CL 91 (L) 10/27/2018 0426   CL 100 08/22/2014 0447   CO2 29 10/27/2018 0426   CO2  30 08/22/2014 0447   GLUCOSE 405 (H) 10/27/2018 0426   GLUCOSE 88 08/22/2014 0447     BUN 20 10/27/2018 0426   BUN 14 08/22/2014 0447   CREATININE 7.01 (H) 10/27/2018 0426   CREATININE 5.00 (H) 08/22/2014 0447   CALCIUM 9.1 10/27/2018 0426   CALCIUM 8.2 (L) 08/22/2014 0447   GFRNONAA 9 (L) 10/27/2018 0426   GFRNONAA 14 (L) 08/22/2014 0447   GFRNONAA 5 (L) 06/19/2014 0937   GFRAA 10 (L) 10/27/2018 0426   GFRAA 17 (L) 08/22/2014 0447   GFRAA 6 (L) 06/19/2014 0937    COAGS: Lab Results  Component Value Date   INR 1.08 08/24/2018   INR 1.00 02/05/2018   INR 1.16 10/25/2017     Non-Invasive Vascular Imaging:   Final Interpretation: Right: Resting right ankle-brachial index indicates noncompressible right lower extremity arteries.The right toe-brachial index is abnormal. Unable to obtain ABI due to noncompressible arteries likely due to medial calcification. Left: Resting left ankle-brachial index indicates noncompressible left lower extremity arteries.The left toe-brachial index is abnormal. Unable to obtain ABI due to noncompressible arteries likely due to medial calcification.  ASSESSMENT/PLAN: This is a 38 y.o. male here with nonhealing wound of his left foot.  I attempted to discuss with him that he likely needs at least transmetatarsal amputation possible below-knee amputation but he is not interested in this.  He was no part of discussing amputation at this time in hopes that he can take a pill for this problem.  I discussed with the hospitalist that I would be available to help if patient becomes amenable to amputation but given that he has palpable pulses he does not need any revascularization at this time and since he is argumentative and abstinent I will be available as needed.  Ayrabella Labombard C. Donzetta Matters, MD [facility] Office: [phone] Pager: [phone]

## 2018-10-27 NOTE — Consult Note (Signed)
[place] Nurse wound consult note  Upon entering the room the patient requested to photograph the [place] nurse and he requested permission to record our conversation.  I have not given the patient permission to do this.    Reason for Consult: assist with wound care left great toe wound Patient S/P Incision and Drainage below the Deep Fascia, complicated  Wound type: necrotic great toe with surrounding full thickness surgical wound, exposed tendon and skin necrosis. Extension of erythema which has been marked by this [place] nurse. Extension of mottling and discoloration of the medial foot and the plantar foot surface. Slight discoloration noted of the achilles area  Pressure Injury POA: NA Measurement: did not measure today, see measurements from monday Wound bed: moistly clean wound bed with fibrin, exposed tendon on the dorsal surface. Progression of the gangrene of the great toe. Maceration has extended to the 3rd toe base. The second toe base and wound was macerated circumferentially on Monday and actually the "soupy" area between the great toe and second toe appears to have improved.  Drainage (amount, consistency, odor) minimal on the dressing removed by [facility] nurse, was just placed earlier by podiatrist following patient.  Periwound: see above for full description.  Dressing procedure/placement/frequency: Based on the appearance of the wound today I will order silver hydrofiber for the antimicrobial effects and exudate management. However I explained to the patient he needs a surgical amputation of the great toe to really even consider any type of healing at this point.  30 minutes spent with this patient attempting to explain how [place] protected periwound skin on Monday, apply NPWT correctly and verify suction set at 165mmHG. On the hospital machine. The machine will not run with a zero pressure and I would not have been able to obtain a seal of the dressing.  Attempted to explain that the staff who  connected him to the home unit would have faced the same thing and if he has any further concerns with that aspect of his care and DC he will need to discuss this with nursing leadership.   I have tried to clinically explain that the extension of the skin changes (blue discoloration) is related to circulation and the extension of the redness is an extension of the infection of the wound/foot.  I have suggested that amputation is is best option. We discussed his other conditions DM and renal disease and how this effects the blood vessels.  I have also tried to suggest that bearing weight on the foot right now is not in his best interest and can be detrimental to healing.  It is not clear that he is using his post op shoe, it is however at the bedside.   I have made it very clear that the rationale for my choice of wound care but that in no way did I feel that topical care would resolve the issues with the necrotic toe or defend the systemic effects on the foot from infection and limited arterial circulation. He is very defensive and most focused on the nurse who came to his home and showing me a video of her assessment and contact with the MD. He is also very focused on who "didn't hook up the home machine" correctly and despite 3-4 x of me explaining how I placed the dressing on Monday and hooked him to a hospital NPWT device he did not recall how he was hooked to home unit, who did this, and reported he "did not care about  the instructions for the home machine" to be able to trouble shoot this machine.  "this is not my job".    I will attempt to contact the referring provider Dr. March Rummage to provide an update on my assessment and conversation with this patient.   Discussed POC with patient and bedside nurse.  Re consult if needed, will not follow at this time. Thanks  Marquie Aderhold [employer], RN,CWOCN, CNS, [place] [phone])

## 2018-10-27 NOTE — Progress Notes (Signed)
Explained to pt why he was not on a regular diet and MD put him on renal diet Carb Mod,PT stated " I eat what I want to eat I"m not eating no renal diet I got people bringing me food to eat " As I was leaving the room a male brought him a two liter [facility] and bag off fast food and then a male brought him a two liter fruit punch and bag filled with snacks.Explained to his visitors that his sugar was 315 and that if he eats that food they brought it will increase is blood sugar at that moment he stated" they do what I tell them to do" and pulled out a plastic bag with prefilled Insulin syringes about seven or eight and stated" I got these Insulin syringes to cover myself after I eat"Pt refused to give me the insulin syringes will continue to monitor,NP [place] notified about above situation.awaiting orders.

## 2018-10-27 NOTE — Progress Notes (Addendum)
[facility] Progress Note  Background:  Patient readmitted after 10/25/18 discharge for left great toe osteo with wound culture MSSA, N, V, D.  He dialyzed yesterday at his HD unit and in spite of net UF of 5 L and post wt 78.2 he was still above his EDW.  He was readmitted with worsening left foot and amputation is recommended which he is adamantly against.  Dialysis Orders: [facility] MWF 4 hr EDW 75.5 kg heparin 8000 2 K 2 Ca left upper AVGG calcitriol 2.5 venofer 100 x 7 on hold due to recent infection.  hgb at d/c 11.2 no ESA at present - last Mircera was 225 12/10 and prior to that 75  Assessment/Plan: 1. Left Diabetic foot infection with osteo s/p two I and D with VAC and worsening course after d/c on doxy even with improving WBC- amputation recommended --pt declining at this point - empiric Vanc and Unasyn 2. ESRD - MWF - plan HD Friday - prefers first shift - the earlier the better - doesn't even mind if it is 3 am 3. Anemia - hgb declining - down to 9.4 - resume ESA- last tsat was 17% so there is a component of Fe deficiency -Fe had been deferred due to infection - will just give weekly dose for now 4. Secondary hyperparathyroidism - continue sevelamer, sensipar, calcitriol 5. HTN/volume - needs volume down for better BP control. Try for EDW 6. Nutrition - on regular diet per pt preference but will add fluid restriction 7. DM - per primary I have seen and examined this patient and agree with the plan of care   Now with worsening diabetic foot infection and in need of possible amputation   Dialysis can be scheduled on Friday  Sherril Croon 10/27/2018, 1:26 PM  Myriam Jacobson, PA-C [place] [phone] 10/27/2018,12:01 PM  LOS: 1 day   Subjective:   Requesting early dialysis.  No significant dialysis issues  Objective Vitals:   10/26/18 1915 10/26/18 1930 10/26/18 2033 10/27/18 1126  BP: (!) 146/82 (!) 157/93 (!) 155/86 (!) 171/109  Pulse: 100 99  97 88  Resp:   19 18  Temp:    98.4 F (36.9 C)  TempSrc:    Oral  SpO2: 100% 100% 99% 100%   Physical Exam General: NAD breathing easily on room air Heart: RRR Lungs: no rales Abdomen: soft NT ND + BS Extremities: left foot wrapped - toe dark slight LLE edema Dialysis Access: left upper AVGG + bruit   Additional Objective Labs: Basic Metabolic Panel: Recent Labs  Lab 10/24/18 0826 10/26/18 1716 10/27/18 0426  NA 131* 132* 132*  K 3.5 3.8 4.1  CL 95* 91* 91*  CO2 21* 26 29  GLUCOSE 296* 203* 405*  BUN 41* 13 20  CREATININE 9.39* 5.83* 7.01*  CALCIUM 8.4* 9.4 9.1  PHOS 3.7  --   --    Liver Function Tests: Recent Labs  Lab 10/24/18 0826 10/26/18 1716  AST  --  23  ALT  --  5  ALKPHOS  --  112  BILITOT  --  0.7  PROT  --  7.3  ALBUMIN 2.2* 2.5*   No results for input(s): LIPASE, AMYLASE in the last 168 hours. CBC: Recent Labs  Lab 10/21/18 1022 10/22/18 1233 10/24/18 0826 10/26/18 1716 10/27/18 0426  WBC 22.7* 17.8* 3.9* 13.4* 9.6  NEUTROABS  --   --   --  11.0*  --   HGB 11.5* 11.4*  11.2* 10.0* 9.4*  HCT 36.1* 36.6* 36.2* 33.3* 30.8*  MCV 93.5 93.4 93.5 96.8 96.6  PLT 151 191 153 182 212   Blood Culture    Component Value Date/Time   SDES BLOOD RIGHT FOREARM 10/26/2018 1725   SPECREQUEST  10/26/2018 1725    BOTTLES DRAWN AEROBIC AND ANAEROBIC Blood Culture adequate volume   CULT  10/26/2018 1725    NO GROWTH < 24 HOURS Performed at [facility], [place] [address], [place], [place] [address]    REPTSTATUS PENDING 10/26/2018 1725    Cardiac Enzymes: No results for input(s): CKTOTAL, CKMB, CKMBINDEX, TROPONINI in the last 168 hours. CBG: Recent Labs  Lab 10/25/18 1725 10/26/18 1814 10/26/18 2035 10/27/18 0142 10/27/18 1038  GLUCAP 138* 274* 312* 446* 287*   Iron Studies: No results for input(s): IRON, TIBC, TRANSFERRIN, FERRITIN in the last 72 hours. Lab Results  Component Value Date   INR 1.08 08/24/2018   INR 1.00 02/05/2018    INR 1.16 10/25/2017   Studies/Results: Dg Foot Complete Left  Result Date: 10/26/2018 CLINICAL DATA:  Left foot pain. EXAM: LEFT FOOT - COMPLETE 3+ VIEW COMPARISON:  Radiographs of October 20, 2018. FINDINGS: Status post surgical resection of most of the fifth metatarsal. Vascular calcifications are noted. No acute fracture or dislocation is noted. There remains indistinct margins involving the residual portion of the fifth metatarsal as described on prior exam. IMPRESSION: Postsurgical changes as described above. Residual irregularity involving the surgical margins of the residual portion of fifth metatarsal as described on prior exam. The possibility of osteomyelitis can not be excluded. Electronically Signed   By: Marijo Conception, M.D.   On: 10/26/2018 17:49   Medications: . sodium chloride    . ampicillin-sulbactam (UNASYN) IV 1.5 g (10/27/18 1025)   . [START ON 10/28/2018] calcitRIOL  2.5 mcg Oral Q M,W,F-HD  . cinacalcet  30 mg Oral Q supper  . insulin aspart  0-5 Units Subcutaneous QHS  . insulin aspart  0-9 Units Subcutaneous TID WC  . insulin glargine  15 Units Subcutaneous QHS  . multivitamin  1 tablet Oral QHS  . sevelamer carbonate  2,400 mg Oral TID WC  . sodium chloride flush  3 mL Intravenous Q12H

## 2018-10-27 NOTE — Progress Notes (Signed)
[facility] PROGRESS NOTE    Progress Note  [name]  EXH:[id number] DOB: [birth year] DOA: 10/26/2018 PCP: Patient, No Pcp Per     Brief Narrative:   [name] is an 38 y.o. male past medical history history significant for end-stage renal disease with dialysis Monday Wednesday Friday last dialysis was on the day of admission chronic bilateral wound infections diabetes mellitus, recently discharged on the day prior to admission with a wound VAC and oral antibiotics, the wound VAC nurse relates the wound VAC was not on on appropriately when she removed that some skin peeled off and it was very red.  The podiatrist was consulted who relates he would need an amputation but the patient has been reluctant to do so in the past.  Assessment/Plan:   Osteomyelitis of foot, left, acute ([name]/  Diabetic ulcer of toe of left foot associated with diabetes mellitus due to underlying condition, with necrosis of muscle ([name])/ left foot cellulitis: Patient was placed empirically on IV vancomycin and cefepime. He is not septic. Thepodiatrist has been consulted who recommended amputation. We will de-escalate his antibiotic coverage to IV Unasyn previous cultures show MSSA, Unasyn will help with anaerobic coverage. Awaiting vascular and podiatry's recommendations, last time he was on the hospital he refused the need of amputation of the great toe.  ESRD (end stage renal disease) on dialysis [facility]) We will inform nephrology for possible dialysis on 10/29/2018.  Anemia of chronic disease Further management per renal.  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: Continue long-acting insulin plus sliding scale   DVT prophylaxis: lovenxo Family Communication:none Disposition Plan/Barrier to D/C: unable to determine Code Status:     Code Status Orders  (From admission, onward)         Start     Ordered   10/26/18 1949  Full code  Continuous     10/26/18 1949        Code Status History    Date Active Date Inactive Code Status Order ID Comments User Context   10/20/2018 1730 10/25/2018 2038 Full Code [id number]  Modena Jansky, MD Inpatient   08/17/2018 2332 08/25/2018 1645 Full Code [id number]  Shela Leff, MD Inpatient   02/01/2018 1446 02/06/2018 0032 Full Code [id number]  Jule Ser, DO ED   10/25/2017 2225 11/03/2017 2322 Full Code [id number]  [name], MD ED   11/16/2016 1338 11/16/2016 1649 Full Code [id number]  Elam Dutch, MD Inpatient   05/27/2015 0707 05/29/2015 1809 Full Code [id number]  Harrie Foreman, MD Inpatient        IV Access:    Peripheral IV   Procedures and diagnostic studies:   Dg Foot Complete Left  Result Date: 10/26/2018 CLINICAL DATA:  Left foot pain. EXAM: LEFT FOOT - COMPLETE 3+ VIEW COMPARISON:  Radiographs of October 20, 2018. FINDINGS: Status post surgical resection of most of the fifth metatarsal. Vascular calcifications are noted. No acute fracture or dislocation is noted. There remains indistinct margins involving the residual portion of the fifth metatarsal as described on prior exam. IMPRESSION: Postsurgical changes as described above. Residual irregularity involving the surgical margins of the residual portion of fifth metatarsal as described on prior exam. The possibility of osteomyelitis can not be excluded. Electronically Signed   By: Marijo Conception, M.D.   On: 10/26/2018 17:49     Medical Consultants:    None.  Anti-Infectives:   IV Unasyn  Subjective:    [name] he relates  he is in no pain. Objective:    Vitals:   10/26/18 1700 10/26/18 1915 10/26/18 1930 10/26/18 2033  BP: 130/72 (!) 146/82 (!) 157/93 (!) 155/86  Pulse: (!) 103 100 99 97  Resp:    19  Temp:      TempSrc:      SpO2: 100% 100% 100% 99%    Intake/Output Summary (Last 24 hours) at 10/27/2018 0823 Last data filed at 10/27/2018 0438 Gross per 24 hour  Intake 950 ml  Output 0 ml  Net 950 ml     There were no vitals filed for this visit.  Exam: General exam: In no acute distress. Respiratory system: Good air movement and clear to auscultation. Cardiovascular system: S1 & S2 heard, RRR.  Gastrointestinal system: Abdomen is nondistended, soft and nontender.  Central nervous system: Alert and oriented. No focal neurological deficits. Extremities: No pedal edema. Skin: Purplish great toe with mild odor Psychiatry: Judgement and insight appear normal. Mood & affect appropriate.    Data Reviewed:    Labs: Basic Metabolic Panel: Recent Labs  Lab 10/21/18 1022 10/22/18 1233 10/24/18 0826 10/26/18 1716 10/27/18 0426  NA 132* 130* 131* 132* 132*  K 3.0* 3.3* 3.5 3.8 4.1  CL 94* 89* 95* 91* 91*  CO2 21* 26 21* 26 29  GLUCOSE 250* 141* 296* 203* 405*  BUN 21* 35* 41* 13 20  CREATININE 5.72* 8.14* 9.39* 5.83* 7.01*  CALCIUM 8.6* 8.9 8.4* 9.4 9.1  PHOS  --   --  3.7  --   --    GFR Estimated Creatinine Clearance: 13.9 mL/min (A) (by C-G formula based on SCr of 7.01 mg/dL (H)). Liver Function Tests: Recent Labs  Lab 10/20/18 1013 10/24/18 0826 10/26/18 1716  AST 23  --  23  ALT 14  --  5  ALKPHOS 162*  --  112  BILITOT 1.3*  --  0.7  PROT 7.5  --  7.3  ALBUMIN 2.8* 2.2* 2.5*   Recent Labs  Lab 10/20/18 1013  LIPASE 28   No results for input(s): AMMONIA in the last 168 hours. Coagulation profile No results for input(s): INR, PROTIME in the last 168 hours.  CBC: Recent Labs  Lab 10/21/18 1022 10/22/18 1233 10/24/18 0826 10/26/18 1716 10/27/18 0426  WBC 22.7* 17.8* 3.9* 13.4* 9.6  NEUTROABS  --   --   --  11.0*  --   HGB 11.5* 11.4* 11.2* 10.0* 9.4*  HCT 36.1* 36.6* 36.2* 33.3* 30.8*  MCV 93.5 93.4 93.5 96.8 96.6  PLT 151 191 153 182 212   Cardiac Enzymes: No results for input(s): CKTOTAL, CKMB, CKMBINDEX, TROPONINI in the last 168 hours. BNP (last 3 results) No results for input(s): PROBNP in the last 8760 hours. CBG: Recent Labs  Lab  10/25/18 1208 10/25/18 1725 10/26/18 1814 10/26/18 2035 10/27/18 0142  GLUCAP 248* 138* 274* 312* 446*   D-Dimer: No results for input(s): DDIMER in the last 72 hours. Hgb A1c: No results for input(s): HGBA1C in the last 72 hours. Lipid Profile: No results for input(s): CHOL, HDL, LDLCALC, TRIG, CHOLHDL, LDLDIRECT in the last 72 hours. Thyroid function studies: No results for input(s): TSH, T4TOTAL, T3FREE, THYROIDAB in the last 72 hours.  Invalid input(s): FREET3 Anemia work up: No results for input(s): VITAMINB12, FOLATE, FERRITIN, TIBC, IRON, RETICCTPCT in the last 72 hours. Sepsis Labs: Recent Labs  Lab 10/20/18 1242 10/20/18 2228  10/22/18 1233 10/24/18 0826 10/26/18 1716 10/26/18 1822 10/27/18 0426  WBC  --   --    < >  17.8* 3.9* 13.4*  --  9.6  LATICACIDVEN 1.2 1.4  --   --   --  2.6* 2.0*  --    < > = values in this interval not displayed.   Microbiology Recent Results (from the past 240 hour(s))  WOUND CULTURE     Status: Abnormal   Collection Time: 10/19/18  3:15 PM  Result Value Ref Range Status   MICRO NUMBER: [id number]  Final   SPECIMEN QUALITY: Adequate  Final   SOURCE: LEFT FOOT LATERAL  Final   STATUS: FINAL  Final   GRAM STAIN:   Final    No white blood cells seen No epithelial cells seen Few Gram positive cocci in pairs   ISOLATE 1: Staphylococcus aureus (A)  Final    Comment: Heavy growth of Staphylococcus aureus      Susceptibility   Staphylococcus aureus - AEROBIC CULT, GRAM STAIN POSITIVE 1    VANCOMYCIN <=0.5 Sensitive     CIPROFLOXACIN <=0.5 Sensitive     CLINDAMYCIN <=0.25 Sensitive     LEVOFLOXACIN <=0.12 Sensitive     ERYTHROMYCIN <=0.25 Sensitive     GENTAMICIN <=0.5 Sensitive     OXACILLIN* <=0.25 Sensitive      * Oxacillin-susceptible staphylococci aresusceptible to other penicillinase-stablepenicillins (e.g. Methicillin, Nafcillin), beta-lactam/beta-lactamase inhibitor combinations, andcephems with staphylococcal indications,  includingCefazolin.    TETRACYCLINE <=1 Sensitive     TRIMETH/SULFA* <=10 Sensitive      * Oxacillin-susceptible staphylococci aresusceptible to other penicillinase-stablepenicillins (e.g. Methicillin, Nafcillin), beta-lactam/beta-lactamase inhibitor combinations, andcephems with staphylococcal indications, includingCefazolin.Legend:S = Susceptible  I = IntermediateR = Resistant  NS = Not susceptible* = Not tested  NR = Not reported**NN = See antimicrobic comments  Blood Culture (routine x 2)     Status: None   Collection Time: 10/20/18 12:13 PM  Result Value Ref Range Status   Specimen Description BLOOD RIGHT ANTECUBITAL  Final   Special Requests   Final    BOTTLES DRAWN AEROBIC AND ANAEROBIC Blood Culture adequate volume   Culture   Final    NO GROWTH 5 DAYS Performed at [facility], [place] [address], [place], [place] [address]    Report Status 10/25/2018 FINAL  Final  Gastrointestinal Panel by PCR , Stool     Status: None   Collection Time: 10/20/18 12:26 PM  Result Value Ref Range Status   Campylobacter species NOT DETECTED NOT DETECTED Final   Plesimonas shigelloides NOT DETECTED NOT DETECTED Final   Salmonella species NOT DETECTED NOT DETECTED Final   Yersinia enterocolitica NOT DETECTED NOT DETECTED Final   Vibrio species NOT DETECTED NOT DETECTED Final   Vibrio cholerae NOT DETECTED NOT DETECTED Final   Enteroaggregative E coli (EAEC) NOT DETECTED NOT DETECTED Final   Enteropathogenic E coli (EPEC) NOT DETECTED NOT DETECTED Final   Enterotoxigenic E coli (ETEC) NOT DETECTED NOT DETECTED Final   Shiga like toxin producing E coli (STEC) NOT DETECTED NOT DETECTED Final   Shigella/Enteroinvasive E coli (EIEC) NOT DETECTED NOT DETECTED Final   Cryptosporidium NOT DETECTED NOT DETECTED Final   Cyclospora cayetanensis NOT DETECTED NOT DETECTED Final   Entamoeba histolytica NOT DETECTED NOT DETECTED Final   Giardia lamblia NOT DETECTED NOT DETECTED Final   Adenovirus F40/41 NOT  DETECTED NOT DETECTED Final   Astrovirus NOT DETECTED NOT DETECTED Final   Norovirus GI/GII NOT DETECTED NOT DETECTED Final   Rotavirus A NOT DETECTED NOT DETECTED Final   Sapovirus (I, II, IV, and V) NOT DETECTED NOT DETECTED Final  Comment: Performed at [facility], [place] [place], [place] [address]  C difficile quick scan w PCR reflex     Status: None   Collection Time: 10/20/18 12:26 PM  Result Value Ref Range Status   C Diff antigen NEGATIVE NEGATIVE Final   C Diff toxin NEGATIVE NEGATIVE Final   C Diff interpretation No C. difficile detected.  Final    Comment: Performed at [facility], [place] [address], [place], [place] [address]  Blood Culture (routine x 2)     Status: None   Collection Time: 10/20/18  1:37 PM  Result Value Ref Range Status   Specimen Description BLOOD RIGHT HAND  Final   Special Requests   Final    BOTTLES DRAWN AEROBIC AND ANAEROBIC Blood Culture adequate volume   Culture   Final    NO GROWTH 5 DAYS Performed at [facility], [place] [address], [place], [place] [address]    Report Status 10/25/2018 FINAL  Final  Aerobic/Anaerobic Culture (surgical/deep wound)     Status: None   Collection Time: 10/20/18  8:37 PM  Result Value Ref Range Status   Specimen Description ABSCESS LEFT FOOT  Final   Special Requests NONE  Final   Gram Stain   Final    FEW WBC PRESENT, PREDOMINANTLY MONONUCLEAR RARE GRAM POSITIVE COCCI    Culture   Final    MODERATE STAPHYLOCOCCUS AUREUS NO ANAEROBES ISOLATED Performed at [facility], [place] [address] [place], [place] [address]    Report Status 10/25/2018 FINAL  Final   Organism ID, Bacteria STAPHYLOCOCCUS AUREUS  Final      Susceptibility   Staphylococcus aureus - MIC*    CIPROFLOXACIN <=0.5 SENSITIVE Sensitive     ERYTHROMYCIN <=0.25 SENSITIVE Sensitive     GENTAMICIN <=0.5 SENSITIVE Sensitive     OXACILLIN <=0.25 SENSITIVE Sensitive     TETRACYCLINE <=1 SENSITIVE Sensitive      VANCOMYCIN <=0.5 SENSITIVE Sensitive     TRIMETH/SULFA <=10 SENSITIVE Sensitive     CLINDAMYCIN <=0.25 SENSITIVE Sensitive     RIFAMPIN <=0.5 SENSITIVE Sensitive     Inducible Clindamycin NEGATIVE Sensitive     * MODERATE STAPHYLOCOCCUS AUREUS  MRSA PCR Screening     Status: None   Collection Time: 10/27/18  4:11 AM  Result Value Ref Range Status   MRSA by PCR NEGATIVE NEGATIVE Final    Comment:        The GeneXpert MRSA Assay (FDA approved for NASAL specimens only), is one component of a comprehensive MRSA colonization surveillance program. It is not intended to diagnose MRSA infection nor to guide or monitor treatment for MRSA infections. Performed at [facility], [place] [address], [place],  [address]      Medications:   . [START ON 10/28/2018] calcitRIOL  2.5 mcg Oral Q M,W,F-HD  . cinacalcet  30 mg Oral Q supper  . insulin aspart  6-7 Units Subcutaneous BID WC  . insulin glargine  15 Units Subcutaneous QHS  . multivitamin  1 tablet Oral QHS  . sevelamer carbonate  2,400-3,200 mg Oral BID WC  . sodium chloride flush  3 mL Intravenous Q12H   Continuous Infusions: . sodium chloride    . ceFEPime (MAXIPIME) IV    . [START ON 10/28/2018] vancomycin        LOS: 1 day   Charlynne Cousins  [facility]  10/27/2018, 8:23 AM

## 2018-10-27 NOTE — Progress Notes (Signed)
10/27/2018 1:12 PM  Witnessed patient taking 4 of his own binders in a white bottle by the sink in his room.   Explained to him not take these pills and wait for your nurse to give you your prescribed medication. Patient refused. Swallowed pills. Then patient attempted to educate this writer about what pills he was taking.   Informed attending RN about pills taken.   Yadhira Mckneely MSN, RN-BC, CNML [facility] Phone: [phone]

## 2018-10-27 NOTE — Progress Notes (Signed)
[facility]  Patient Status: Active (receiving services up to time of hospitalization)  RN saw patient for the admission visit and sent patient to ED. Will need home health orders at discharge if services needed.  [facility] is providing the following services: RN  If patient discharges after hours, please call [phone].   Janae Sauce 10/27/2018, 10:21 AM

## 2018-10-27 NOTE — Progress Notes (Signed)
Patient declined glucose check scheduled for 7 AM and stated "leave me the f*ck alone". Therefore sliding scale cannot be used to determine whether or not insulin coverage is necessary.

## 2018-10-27 NOTE — Consult Note (Signed)
Reason for Consult: Left great toe black Referring Physician: [place]  [name] is an 38 y.o. male.  HPI: Patient well-known to provider was discharged recently status post debridement and irrigation of left foot wound for abscess formation.  Prior to discharge she was offered amputation versus attempted salvage patient elected for attempted salvage.  He was sent home with a wound VAC.   Upon first evaluation by his home health care nurse, she was worried about the condition of the foot.  She was concerned that the toe was black and cold and his wound VAC was having a problem.  He was sent to the ED and was admitted from there.  Patient has caused significant issues with staff since his been admitted and has been complaining about his diet and IV pain medication.  Patient was resting comfortably this morning but was frustrated regarding his medical issues and the issues related to his foot.   Past Medical History:  Diagnosis Date  . Diabetes mellitus without complication ([place])   . Diabetic gastroparesis ([place])   . Dialysis patient ([place])   . Hypertension   . Renal disorder    Dialysis  . Sepsis [facility])     Past Surgical History:  Procedure Laterality Date  . AMPUTATION Right 02/02/2018   Procedure: RIGHT FIFTH TOE AND METATARSAL AMPUTATION. Filetted toe flap metatarsal resection. Debridement Plantar Foot wound;  Surgeon: Evelina Bucy, DPM;  Location: [place];  Service: Podiatry;  Laterality: Right;  . AMPUTATION Left 08/20/2018   Procedure: FIFTH METATARSAL BONE BIOPSY;  Surgeon: Evelina Bucy, DPM;  Location: [place];  Service: Podiatry;  Laterality: Left;  . APPLICATION OF WOUND VAC  02/02/2018   Procedure: APPLICATION OF WOUND VAC  Right Foot;  Surgeon: Evelina Bucy, DPM;  Location: [place];  Service: Podiatry;;  . AV FISTULA PLACEMENT     left arm.  . AV FISTULA PLACEMENT Right 12/22/2016   Procedure: INSERTION OF ARTERIOVENOUS (AV) GORE-TEX GRAFT ARM;  Surgeon: Elam Dutch,  MD;  Location: [facility] OR;  Service: Vascular;  Laterality: Right;  . AV FISTULA PLACEMENT Left 05/26/2018   Procedure: INSERTION OF  ARTERIOVENOUS (AV) GORE-TEX GRAFT LEFT ARM;  Surgeon: Serafina Mitchell, MD;  Location: [place];  Service: Vascular;  Laterality: Left;  . EYE SURGERY    . I&D EXTREMITY Right 10/31/2017   Procedure: IRRIGATION AND DEBRIDEMENT RIGHT FOOT;  Surgeon: Evelina Bucy, DPM;  Location: [place];  Service: Podiatry;  Laterality: Right;  . I&D EXTREMITY Left 08/20/2018   Procedure: IRRIGATION AND DEBRIDEMENT EXTREMITY WITH SECONDARY WOUND CLOSUREAND APPLICATION OF WOUND VAC LEFT FOOT;  Surgeon: Evelina Bucy, DPM;  Location: [place];  Service: Podiatry;  Laterality: Left;  . I&D EXTREMITY Left 10/20/2018   Procedure: IRRIGATION AND DEBRIDEMENT LEFT FOOT  DEBRIDEMENT LATERAL FOOT WOUND;  Surgeon: Evelina Bucy, DPM;  Location: [place];  Service: Podiatry;  Laterality: Left;  . INSERTION OF DIALYSIS CATHETER     Right subclavian  . IR AV DIALY SHUNT INTRO NEEDLE/INTRACATH INITIAL W/PTA/IMG RIGHT Right 02/05/2018  . IR THROMBECTOMY AV FISTULA W/THROMBOLYSIS/PTA INC/SHUNT/IMG LEFT Left 08/24/2018  . IR US GUIDE VASC ACCESS LEFT  08/24/2018  . IR US GUIDE VASC ACCESS RIGHT  02/05/2018  . IRRIGATION AND DEBRIDEMENT FOOT Right 10/23/2018   Procedure: Irrigation and Debridement to tendon, Left Foot;  Surgeon: Evelina Bucy, DPM;  Location: [place];  Service: Podiatry;  Laterality: Right;  . TRANSMETATARSAL AMPUTATION N/A 08/18/2018  Procedure: IRRIGATION AND DEBRIDEMENT OF LEFT 5TH TOE AND TRANSMETATARSAL, WITH PARTICAL LEFT 5TH TOE AND METATARSAL AMPUTATION, BONE BIOPSY, WOUND VAC APPLICATION.;  Surgeon: Evelina Bucy, DPM;  Location: [place];  Service: Podiatry;  Laterality: N/A;  . UPPER EXTREMITY VENOGRAPHY N/A 11/16/2016   Procedure: Upper Extremity Venography - Right Central;  Surgeon: Elam Dutch, MD;  Location: [facility];  Service: Cardiovascular;  Laterality: N/A;  .  UPPER EXTREMITY VENOGRAPHY N/A 05/25/2018   Procedure: UPPER EXTREMITY VENOGRAPHY - Bilateral;  Surgeon: Marty Heck, MD;  Location: [place] [facility];  Service: Cardiovascular;  Laterality: N/A;    Family History  Problem Relation Age of Onset  . Diabetes Mellitus II Other   . Diabetes Father   . Renal Disease Father        ESRD    Social History:  reports that he has never smoked. He has never used smokeless tobacco. He reports that he does not drink alcohol or use drugs.  Allergies: No Known Allergies  Medications: I have reviewed the patient's current medications.  Results for orders placed or performed during the hospital encounter of 10/26/18 (from the past 48 hour(s))  Lactic acid, plasma     Status: Abnormal   Collection Time: 10/26/18  5:16 PM  Result Value Ref Range   Lactic Acid, Venous 2.6 (HH) 0.5 - 1.9 mmol/L    Comment: CRITICAL RESULT CALLED TO, READ BACK BY AND VERIFIED WITH: CHRTISCO, C RN @ 2993 ON 10/26/2018 BY TEMOCHE, H Performed at [facility], [address], [place], [place] [address]   Comprehensive metabolic panel     Status: Abnormal   Collection Time: 10/26/18  5:16 PM  Result Value Ref Range   Sodium 132 (L) 135 - 145 mmol/L   Potassium 3.8 3.5 - 5.1 mmol/L   Chloride 91 (L) 98 - 111 mmol/L   CO2 26 22 - 32 mmol/L   Glucose, Bld 203 (H) 70 - 99 mg/dL   BUN 13 6 - 20 mg/dL   Creatinine, Ser 5.83 (H) 0.61 - 1.24 mg/dL   Calcium 9.4 8.9 - 10.3 mg/dL   Total Protein 7.3 6.5 - 8.1 g/dL   Albumin 2.5 (L) 3.5 - 5.0 g/dL   AST 23 15 - 41 U/L   ALT 5 0 - 44 U/L   Alkaline Phosphatase 112 38 - 126 U/L   Total Bilirubin 0.7 0.3 - 1.2 mg/dL   GFR calc non Af Amer 11 (L) >60 mL/min   GFR calc Af Amer 13 (L) >60 mL/min   Anion gap 15 5 - 15    Comment: Performed at [facility] [address], [place], [place] [address]  CBC with Differential     Status: Abnormal   Collection Time: 10/26/18  5:16 PM  Result Value Ref Range    WBC 13.4 (H) 4.0 - 10.5 K/uL   RBC 3.44 (L) 4.22 - 5.81 MIL/uL   Hemoglobin 10.0 (L) 13.0 - 17.0 g/dL   HCT 33.3 (L) 39.0 - 52.0 %   MCV 96.8 80.0 - 100.0 fL   MCH 29.1 26.0 - 34.0 pg   MCHC 30.0 30.0 - 36.0 g/dL   RDW 14.8 11.5 - 15.5 %   Platelets 182 150 - 400 K/uL   nRBC 0.0 0.0 - 0.2 %   Neutrophils Relative % 82 %   Neutro Abs 11.0 (H) 1.7 - 7.7 K/uL   Lymphocytes Relative 7 %  Lymphs Abs 1.0 0.7 - 4.0 K/uL   Monocytes Relative 7 %   Monocytes Absolute 0.9 0.1 - 1.0 K/uL   Eosinophils Relative 2 %   Eosinophils Absolute 0.3 0.0 - 0.5 K/uL   Basophils Relative 0 %   Basophils Absolute 0.0 0.0 - 0.1 K/uL   Immature Granulocytes 2 %   Abs Immature Granulocytes 0.20 (H) 0.00 - 0.07 K/uL    Comment: Performed at [facility] [address] [place], [place] [address]  CBG monitoring, ED     Status: Abnormal   Collection Time: 10/26/18  6:14 PM  Result Value Ref Range   Glucose-Capillary 274 (H) 70 - 99 mg/dL  Lactic acid, plasma     Status: Abnormal   Collection Time: 10/26/18  6:22 PM  Result Value Ref Range   Lactic Acid, Venous 2.0 (HH) 0.5 - 1.9 mmol/L    Comment: CRITICAL RESULT CALLED TO, READ BACK BY AND VERIFIED WITH: C CHRISCO,RN 1855 10/26/2018 WBOND Performed at [facility], [place] [address], [place], [place] [address]   Glucose, capillary     Status: Abnormal   Collection Time: 10/26/18  8:35 PM  Result Value Ref Range   Glucose-Capillary 312 (H) 70 - 99 mg/dL  Glucose, capillary     Status: Abnormal   Collection Time: 10/27/18  1:42 AM  Result Value Ref Range   Glucose-Capillary 446 (H) 70 - 99 mg/dL  MRSA PCR Screening     Status: None   Collection Time: 10/27/18  4:11 AM  Result Value Ref Range   MRSA by PCR NEGATIVE NEGATIVE    Comment:        The GeneXpert MRSA Assay (FDA approved for NASAL specimens only), is one component of a comprehensive MRSA colonization surveillance program. It is not intended to diagnose MRSA infection nor  to guide or monitor treatment for MRSA infections. Performed at [facility], [place] [address], [place], [place] [address]   Basic metabolic panel     Status: Abnormal   Collection Time: 10/27/18  4:26 AM  Result Value Ref Range   Sodium 132 (L) 135 - 145 mmol/L   Potassium 4.1 3.5 - 5.1 mmol/L   Chloride 91 (L) 98 - 111 mmol/L   CO2 29 22 - 32 mmol/L   Glucose, Bld 405 (H) 70 - 99 mg/dL   BUN 20 6 - 20 mg/dL   Creatinine, Ser 7.01 (H) 0.61 - 1.24 mg/dL   Calcium 9.1 8.9 - 10.3 mg/dL   GFR calc non Af Amer 9 (L) >60 mL/min   GFR calc Af Amer 10 (L) >60 mL/min   Anion gap 12 5 - 15    Comment: Performed at [facility] [address], [name], [place] [address]  CBC     Status: Abnormal   Collection Time: 10/27/18  4:26 AM  Result Value Ref Range   WBC 9.6 4.0 - 10.5 K/uL   RBC 3.19 (L) 4.22 - 5.81 MIL/uL   Hemoglobin 9.4 (L) 13.0 - 17.0 g/dL   HCT 30.8 (L) 39.0 - 52.0 %   MCV 96.6 80.0 - 100.0 fL   MCH 29.5 26.0 - 34.0 pg   MCHC 30.5 30.0 - 36.0 g/dL   RDW 15.1 11.5 - 15.5 %   Platelets 212 150 - 400 K/uL   nRBC 0.0 0.0 - 0.2 %    Comment: Performed at [facility], [place] [address] [place], [place] [address]  Dg Foot Complete Left  Result Date: 10/26/2018 CLINICAL DATA:  Left foot pain. EXAM: LEFT FOOT - COMPLETE 3+ VIEW COMPARISON:  Radiographs of October 20, 2018. FINDINGS: Status post surgical resection of most of the fifth metatarsal. Vascular calcifications are noted. No acute fracture or dislocation is noted. There remains indistinct margins involving the residual portion of the fifth metatarsal as described on prior exam. IMPRESSION: Postsurgical changes as described above. Residual irregularity involving the surgical margins of the residual portion of fifth metatarsal as described on prior exam. The possibility of osteomyelitis can not be excluded. Electronically Signed   By: Marijo Conception, M.D.   On: 10/26/2018 17:49    ROS Blood pressure (!)  155/86, pulse 97, temperature 99.8 F (37.7 C), temperature source Oral, resp. rate 19, SpO2 99 %. Physical Exam  Left great toe cool to touch, dusky in appearance.  Exposed tendon in wound bed.  Wound has granulated since last eval. no active purulence.  Resolving erythema.  Warmth proximally.  Insensate foot.  Assessment/Plan:  Gangrene of Left Great Toe -Lengthy discussion had with patient about his clinical situation.  Discussed with patient that despite maceration issues from a possible seal issue I do believe the wound VAC was on and functioning with pressure as the wound has granulated since last eval and patient confirmed that the wound VAC machine was on and functioning.  As long as the machine was on and functioning it would have been set to pressure as there is no setting for to be set to 0 pressure as his home health care nurse described.  I honestly do not know what the condition of the wound VAC was at the time but the nurse removed it and from the clinical pictures it did appear slightly macerated. -I further explained to patient that despite the issue with the wound VAC the more pressing issue is that the severity of the infection he presented with led to a large degree of tissue necrosis, and he does not have sufficient circulation left to heal the great toe.  I discussed that in my opinion the circulation issues of the toe have nothing to do with the wound VAC. -I discussed with the patient that the toe is no longer salvageable at this point and that we need to proceed with amputation.  He remains adamantly against this.  I discussed that if we do not proceed with amputation, he is at a high risk of continued infection and he can lose more of his foot.  Patient verbalized understanding and he says that he is not undergoing any more amputations of any of his toes.  He proceeded to complain all about his issues regarding his hospital course including his issues with pain medication and his  diet and that he does not want to wear ugly old person shoes and is going to continue to do whatever he wants.  I remain concerned that he does not make the best medical decisions for himself. -Mildly elevated WBC on presentation, improved.  Continue antibiotics -Patient refusing amputation.  His clinical situation is unlikely to improve without amputation. -Recommend vascular consult for evaluation.  Appreciate their recommendations. -We will continue to follow however due to patient's noncompliance there is not much that I can offer him at this point. -Orders placed for daily wound care.  45 minutes of face to face time were spent with the patient. >50% of this was spent on counseling and coordination of care. Specifically discussed with patient the  above diagnoses and overall treatment plan.    Evelina Bucy 10/27/2018, 9:42 AM

## 2018-10-28 ENCOUNTER — Inpatient Hospital Stay (HOSPITAL_COMMUNITY): Payer: Medicare Other | Admitting: Certified Registered Nurse Anesthetist

## 2018-10-28 ENCOUNTER — Inpatient Hospital Stay (HOSPITAL_COMMUNITY): Payer: Medicare Other

## 2018-10-28 ENCOUNTER — Encounter (HOSPITAL_COMMUNITY)
Admission: EM | Disposition: A | Discharge status: Home Health | Payer: Self-pay | Source: Home / Self Care | Attending: Internal Medicine

## 2018-10-28 ENCOUNTER — Telehealth: Payer: Self-pay | Admitting: *Deleted

## 2018-10-28 ENCOUNTER — Encounter (HOSPITAL_COMMUNITY): Payer: Self-pay | Admitting: *Deleted

## 2018-10-28 DIAGNOSIS — L02612 Cutaneous abscess of left foot: Secondary | ICD-10-CM | POA: Diagnosis not present

## 2018-10-28 DIAGNOSIS — L089 Local infection of the skin and subcutaneous tissue, unspecified: Secondary | ICD-10-CM

## 2018-10-28 DIAGNOSIS — L03032 Cellulitis of left toe: Secondary | ICD-10-CM

## 2018-10-28 DIAGNOSIS — I10 Essential (primary) hypertension: Secondary | ICD-10-CM | POA: Diagnosis not present

## 2018-10-28 DIAGNOSIS — L03116 Cellulitis of left lower limb: Secondary | ICD-10-CM | POA: Diagnosis not present

## 2018-10-28 DIAGNOSIS — I96 Gangrene, not elsewhere classified: Secondary | ICD-10-CM

## 2018-10-28 DIAGNOSIS — E1052 Type 1 diabetes mellitus with diabetic peripheral angiopathy with gangrene: Secondary | ICD-10-CM | POA: Diagnosis not present

## 2018-10-28 HISTORY — PX: APPLICATION OF WOUND VAC: SHX5189

## 2018-10-28 HISTORY — PX: AMPUTATION: SHX166

## 2018-10-28 HISTORY — PX: I&D EXTREMITY: SHX5045

## 2018-10-28 LAB — GLUCOSE, CAPILLARY
Glucose-Capillary: 257 mg/dL — ABNORMAL HIGH (ref 70–99)
Glucose-Capillary: 97 mg/dL (ref 70–99)
Glucose-Capillary: 105 mg/dL — ABNORMAL HIGH (ref 70–99)
Glucose-Capillary: 299 mg/dL — ABNORMAL HIGH (ref 70–99)

## 2018-10-28 LAB — RENAL FUNCTION PANEL
Albumin: 2.1 g/dL — ABNORMAL LOW (ref 3.5–5.0)
Anion gap: 12 (ref 5–15)
BUN: 33 mg/dL — ABNORMAL HIGH (ref 6–20)
CO2: 26 mmol/L (ref 22–32)
Calcium: 9.2 mg/dL (ref 8.9–10.3)
Chloride: 94 mmol/L — ABNORMAL LOW (ref 98–111)
Creatinine, Ser: 9.01 mg/dL — ABNORMAL HIGH (ref 0.61–1.24)
GFR calc Af Amer: 8 mL/min — ABNORMAL LOW (ref >60)
GFR calc non Af Amer: 7 mL/min — ABNORMAL LOW (ref >60)
Glucose, Bld: 211 mg/dL — ABNORMAL HIGH (ref 70–99)
Phosphorus: 5.9 mg/dL — ABNORMAL HIGH (ref 2.5–4.6)
Potassium: 4.8 mmol/L (ref 3.5–5.1)
Sodium: 132 mmol/L — ABNORMAL LOW (ref 135–145)

## 2018-10-28 SURGERY — AMPUTATION, FOOT, RAY
Anesthesia: General | Site: Toe | Laterality: Left

## 2018-10-28 MED ORDER — OXYCODONE HCL 5 MG PO TABS: 5.0000 mg | ORAL_TABLET | Freq: Once | ORAL | Status: DC | PRN

## 2018-10-28 MED ORDER — MEPERIDINE HCL 50 MG/ML IJ SOLN: 6.2500 mg | INTRAMUSCULAR | Status: DC | PRN

## 2018-10-28 MED ORDER — FENTANYL CITRATE (PF) 250 MCG/5ML IJ SOLN
INTRAMUSCULAR | Status: AC
  Filled 2018-10-28: qty 5

## 2018-10-28 MED ORDER — ONDANSETRON HCL 4 MG/2ML IJ SOLN: 4.0000 mg | Freq: Once | INTRAMUSCULAR | Status: DC | PRN

## 2018-10-28 MED ORDER — INSULIN GLARGINE 100 UNIT/ML ~~LOC~~ SOLN
15.0000 [IU] | Freq: Two times a day (BID) | SUBCUTANEOUS | Status: DC
  Administered 2018-10-28: 15 [IU] via SUBCUTANEOUS
  Filled 2018-10-28 (×2): qty 0.15

## 2018-10-28 MED ORDER — SODIUM CHLORIDE 0.9 % IV SOLN: 100.0000 mL | INTRAVENOUS | Status: DC | PRN

## 2018-10-28 MED ORDER — VANCOMYCIN HCL 1000 MG IV SOLR
INTRAVENOUS | Status: AC
  Filled 2018-10-28: qty 1000

## 2018-10-28 MED ORDER — HYDROCODONE-ACETAMINOPHEN 5-325 MG PO TABS
ORAL_TABLET | ORAL | Status: AC
  Filled 2018-10-28: qty 2

## 2018-10-28 MED ORDER — ARTIFICIAL TEARS OPHTHALMIC OINT
TOPICAL_OINTMENT | OPHTHALMIC | Status: AC
  Filled 2018-10-28: qty 3.5

## 2018-10-28 MED ORDER — HEPARIN SODIUM (PORCINE) 1000 UNIT/ML DIALYSIS: 20.0000 [IU]/kg | INTRAMUSCULAR | Status: DC | PRN

## 2018-10-28 MED ORDER — ALTEPLASE 2 MG IJ SOLR
2.0000 mg | Freq: Once | INTRAMUSCULAR | Status: DC | PRN
  Filled 2018-10-28: qty 2

## 2018-10-28 MED ORDER — ONDANSETRON HCL 4 MG/2ML IJ SOLN
INTRAMUSCULAR | Status: DC | PRN
  Administered 2018-10-28: 4 mg via INTRAVENOUS

## 2018-10-28 MED ORDER — LIDOCAINE 2% (20 MG/ML) 5 ML SYRINGE
INTRAMUSCULAR | Status: AC
  Filled 2018-10-28: qty 5

## 2018-10-28 MED ORDER — PROPOFOL 10 MG/ML IV BOLUS
INTRAVENOUS | Status: DC | PRN
  Administered 2018-10-28: 150 mg via INTRAVENOUS

## 2018-10-28 MED ORDER — FENTANYL CITRATE (PF) 100 MCG/2ML IJ SOLN
INTRAMUSCULAR | Status: AC
  Administered 2018-10-28: 50 ug via INTRAVENOUS
  Filled 2018-10-28: qty 2

## 2018-10-28 MED ORDER — NEOSTIGMINE METHYLSULFATE 10 MG/10ML IV SOLN
INTRAVENOUS | Status: DC | PRN
  Administered 2018-10-28: 5 mg via INTRAVENOUS

## 2018-10-28 MED ORDER — FENTANYL CITRATE (PF) 250 MCG/5ML IJ SOLN
INTRAMUSCULAR | Status: DC | PRN
  Administered 2018-10-28: 50 ug via INTRAVENOUS

## 2018-10-28 MED ORDER — VANCOMYCIN HCL 1000 MG IV SOLR
INTRAVENOUS | Status: DC | PRN
  Administered 2018-10-28: 1000 mg via TOPICAL

## 2018-10-28 MED ORDER — GLYCOPYRROLATE 0.2 MG/ML IJ SOLN
INTRAMUSCULAR | Status: DC | PRN
  Administered 2018-10-28: 0.6 mg via INTRAVENOUS

## 2018-10-28 MED ORDER — PHENYLEPHRINE 40 MCG/ML (10ML) SYRINGE FOR IV PUSH (FOR BLOOD PRESSURE SUPPORT)
PREFILLED_SYRINGE | INTRAVENOUS | Status: AC
  Filled 2018-10-28: qty 30

## 2018-10-28 MED ORDER — LIDOCAINE-PRILOCAINE 2.5-2.5 % EX CREA: 1.0000 "application " | TOPICAL_CREAM | CUTANEOUS | Status: DC | PRN

## 2018-10-28 MED ORDER — FENTANYL CITRATE (PF) 100 MCG/2ML IJ SOLN
25.0000 ug | INTRAMUSCULAR | Status: DC | PRN
  Administered 2018-10-28: 50 ug via INTRAVENOUS

## 2018-10-28 MED ORDER — ACETAMINOPHEN 325 MG PO TABS: 325.0000 mg | ORAL_TABLET | ORAL | Status: DC | PRN

## 2018-10-28 MED ORDER — ONDANSETRON HCL 4 MG/2ML IJ SOLN
INTRAMUSCULAR | Status: AC
  Filled 2018-10-28: qty 2

## 2018-10-28 MED ORDER — BUPIVACAINE HCL (PF) 0.5 % IJ SOLN
INTRAMUSCULAR | Status: DC | PRN
  Administered 2018-10-28: 50 mL

## 2018-10-28 MED ORDER — MIDAZOLAM HCL 2 MG/2ML IJ SOLN
INTRAMUSCULAR | Status: DC | PRN
  Administered 2018-10-28: 2 mg via INTRAVENOUS

## 2018-10-28 MED ORDER — LIDOCAINE 2% (20 MG/ML) 5 ML SYRINGE
INTRAMUSCULAR | Status: DC | PRN
  Administered 2018-10-28: 100 mg via INTRAVENOUS

## 2018-10-28 MED ORDER — BUPIVACAINE HCL 0.5 % IJ SOLN
INTRAMUSCULAR | Status: AC
  Filled 2018-10-28: qty 1

## 2018-10-28 MED ORDER — PENTAFLUOROPROP-TETRAFLUOROETH EX AERO: 1.0000 "application " | INHALATION_SPRAY | CUTANEOUS | Status: DC | PRN

## 2018-10-28 MED ORDER — CALCITRIOL 0.5 MCG PO CAPS
ORAL_CAPSULE | ORAL | Status: AC
  Administered 2018-10-28: 2.5 ug via ORAL
  Filled 2018-10-28: qty 5

## 2018-10-28 MED ORDER — ACETAMINOPHEN 160 MG/5ML PO SOLN: 325.0000 mg | ORAL | Status: DC | PRN

## 2018-10-28 MED ORDER — MIDAZOLAM HCL 2 MG/2ML IJ SOLN
INTRAMUSCULAR | Status: AC
  Filled 2018-10-28: qty 2

## 2018-10-28 MED ORDER — HEPARIN SODIUM (PORCINE) 1000 UNIT/ML DIALYSIS: 1000.0000 [IU] | INTRAMUSCULAR | Status: DC | PRN

## 2018-10-28 MED ORDER — ROCURONIUM BROMIDE 10 MG/ML (PF) SYRINGE
PREFILLED_SYRINGE | INTRAVENOUS | Status: DC | PRN
  Administered 2018-10-28: 50 mg via INTRAVENOUS

## 2018-10-28 MED ORDER — DARBEPOETIN ALFA 100 MCG/0.5ML IJ SOSY
PREFILLED_SYRINGE | INTRAMUSCULAR | Status: AC
  Administered 2018-10-28: 100 ug via INTRAVENOUS
  Filled 2018-10-28: qty 0.5

## 2018-10-28 MED ORDER — 0.9 % SODIUM CHLORIDE (POUR BTL) OPTIME
TOPICAL | Status: DC | PRN
  Administered 2018-10-28: 1000 mL

## 2018-10-28 MED ORDER — LIDOCAINE HCL (PF) 1 % IJ SOLN: 5.0000 mL | INTRAMUSCULAR | Status: DC | PRN

## 2018-10-28 MED ORDER — SODIUM CHLORIDE 0.9 % IV SOLN
INTRAVENOUS | Status: DC
  Administered 2018-10-28: 14:00:00 via INTRAVENOUS

## 2018-10-28 MED ORDER — MORPHINE SULFATE (PF) 4 MG/ML IV SOLN
4.0000 mg | INTRAVENOUS | Status: DC | PRN
  Administered 2018-10-28 – 2018-11-03 (×28): 4 mg via INTRAVENOUS
  Filled 2018-10-28 (×30): qty 1

## 2018-10-28 MED ORDER — OXYCODONE HCL 5 MG/5ML PO SOLN: 5.0000 mg | Freq: Once | ORAL | Status: DC | PRN

## 2018-10-28 SURGICAL SUPPLY — 49 items
BANDAGE ACE 4X5 VEL STRL LF (GAUZE/BANDAGES/DRESSINGS) IMPLANT
BANDAGE ELASTIC 6 VELCRO ST LF (GAUZE/BANDAGES/DRESSINGS) ×3 IMPLANT
BNDG ESMARK 4X9 LF (GAUZE/BANDAGES/DRESSINGS) ×3 IMPLANT
BNDG GAUZE ELAST 4 BULKY (GAUZE/BANDAGES/DRESSINGS) ×3 IMPLANT
CANISTER WOUND CARE 500ML ATS (WOUND CARE) ×3 IMPLANT
CHLORAPREP W/TINT 26ML (MISCELLANEOUS) IMPLANT
CONT SPEC 4OZ CLIKSEAL STRL BL (MISCELLANEOUS) ×3 IMPLANT
COVER SURGICAL LIGHT HANDLE (MISCELLANEOUS) ×3 IMPLANT
COVER WAND RF STERILE (DRAPES) IMPLANT
CUFF TOURNIQUET SINGLE 18IN (TOURNIQUET CUFF) IMPLANT
CUFF TOURNIQUET SINGLE 34IN LL (TOURNIQUET CUFF) IMPLANT
DRAPE U-SHAPE 47X51 STRL (DRAPES) ×3 IMPLANT
DRSG EMULSION OIL 3X3 NADH (GAUZE/BANDAGES/DRESSINGS) IMPLANT
DRSG VAC ATS SM SENSATRAC (GAUZE/BANDAGES/DRESSINGS) ×3 IMPLANT
DRSG VERSA FOAM LRG 10X15 (GAUZE/BANDAGES/DRESSINGS) ×3 IMPLANT
ELECT CAUTERY BLADE 6.4 (BLADE) ×3 IMPLANT
ELECT REM PT RETURN 9FT ADLT (ELECTROSURGICAL) ×3
ELECTRODE REM PT RTRN 9FT ADLT (ELECTROSURGICAL) ×1 IMPLANT
GAUZE SPONGE 4X4 12PLY STRL (GAUZE/BANDAGES/DRESSINGS) IMPLANT
GEL CELLERATE 28G (Miscellaneous) ×3 IMPLANT
GLOVE BIO SURGEON STRL SZ7.5 (GLOVE) ×3 IMPLANT
GLOVE BIOGEL PI IND STRL 8 (GLOVE) ×2 IMPLANT
GLOVE BIOGEL PI INDICATOR 8 (GLOVE) ×4
GOWN STRL REUS W/ TWL LRG LVL3 (GOWN DISPOSABLE) ×2 IMPLANT
GOWN STRL REUS W/ TWL XL LVL3 (GOWN DISPOSABLE) ×1 IMPLANT
GOWN STRL REUS W/TWL LRG LVL3 (GOWN DISPOSABLE) ×4
GOWN STRL REUS W/TWL XL LVL3 (GOWN DISPOSABLE) ×2
HANDPIECE INTERPULSE COAX TIP (DISPOSABLE) ×2
KIT BASIN OR (CUSTOM PROCEDURE TRAY) ×3 IMPLANT
KIT TURNOVER KIT B (KITS) ×3 IMPLANT
MANIFOLD NEPTUNE II (INSTRUMENTS) ×3 IMPLANT
NEEDLE BIOPSY JAMSHIDI 8X6 (NEEDLE) IMPLANT
NEEDLE HYPO 25GX1X1/2 BEV (NEEDLE) ×3 IMPLANT
NS IRRIG 1000ML POUR BTL (IV SOLUTION) ×3 IMPLANT
PACK ORTHO EXTREMITY (CUSTOM PROCEDURE TRAY) ×3 IMPLANT
PAD ARMBOARD 7.5X6 YLW CONV (MISCELLANEOUS) ×3 IMPLANT
PROBE DEBRIDE SONICVAC MISONIX (TIP) IMPLANT
SCRUB BETADINE 4OZ XXX (MISCELLANEOUS) ×3 IMPLANT
SET CYSTO W/LG BORE CLAMP LF (SET/KITS/TRAYS/PACK) IMPLANT
SET HNDPC FAN SPRY TIP SCT (DISPOSABLE) ×1 IMPLANT
SOL PREP POV-IOD 4OZ 10% (MISCELLANEOUS) ×3 IMPLANT
SUT ETHILON 3 0 PS 1 (SUTURE) ×3 IMPLANT
SYR CONTROL 10ML LL (SYRINGE) ×3 IMPLANT
TOWEL GREEN STERILE (TOWEL DISPOSABLE) ×3 IMPLANT
TOWEL GREEN STERILE FF (TOWEL DISPOSABLE) ×3 IMPLANT
TOWEL OR 17X26 10 PK STRL BLUE (TOWEL DISPOSABLE) ×3 IMPLANT
TUBE CONNECTING 12'X1/4 (SUCTIONS) ×1
TUBE CONNECTING 12X1/4 (SUCTIONS) ×2 IMPLANT
YANKAUER SUCT BULB TIP NO VENT (SUCTIONS) ×3 IMPLANT

## 2018-10-28 NOTE — Transfer of Care (Signed)
Immediate Anesthesia Transfer of Care Note  Patient: [name]  Procedure(s) Performed: LEFT GREAT TOE AMPUTATION (Left ) IRRIGATION AND DEBRIDEMENT EXTREMITY (Left ) APPLICATION OF WOUND VAC (Left )  Patient Location: PACU  Anesthesia Type:General  Level of Consciousness: drowsy  Airway & Oxygen Therapy: Patient Spontanous Breathing and Patient connected to face mask oxygen  Post-op Assessment: Report given to RN and Post -op Vital signs reviewed and stable  Post vital signs: Reviewed and stable  Last Vitals:  Vitals Value Taken Time  BP 151/97 10/28/2018  3:31 PM  Temp    Pulse 76 10/28/2018  3:38 PM  Resp 9 10/28/2018  3:38 PM  SpO2 100 % 10/28/2018  3:38 PM  Vitals shown include unvalidated device data.  Last Pain:  Vitals:   10/28/18 1252  TempSrc: Oral  PainSc:          Complications: No apparent anesthesia complications

## 2018-10-28 NOTE — Progress Notes (Signed)
  Subjective:  Patient ID: [name], male    DOB: Mar 18, 1981,  MRN: [id number]  Patient seen at bedside states he has not slept much.  Was seen by vascular surgery who recommended amputation but does not want to lose his leg and is now amenable to amputation of his great toe.  Objective:   Vitals:   10/27/18 2143 10/28/18 0544  BP: (!) 167/97 (!) 163/100  Pulse: 89 87  Resp: 16 16  Temp: 99 F (37.2 C) 98.3 F (36.8 C)  SpO2: 100% 97%   General AA&O x3. Normal mood and affect.  Vascular Left hallux cool to touch black in appearance.  Remainder foot warm  Neurologic Epicritic sensation grossly absent.  Dermatologic Continued gangrene of the left hallux, however the wound proximal to this does continue to show signs of granulation.  No acute warmth erythema.  Orthopedic: Motor intact to the foot.    Assessment & Plan:  Patient was evaluated and treated and all questions answered.  Gangrene Left Great Toe -Was seen by vascular surgery who agreed that he needs to undergo amputation recommend transmetatarsal versus BKA patient refused both options. -Now amenable to great toe amputation only and refusing any other options.  I discussed with the patient poor likelihood of healing.  Patient states he wants to try removing just the dead part it who takes multiple surgeries to try to save what he has left and avoid further amputation.  I specifically discussed with the patient that I do not know that his circulation is enough to heal this however. This would be the last procedure that I would do for him before I would ask for evaluation by vascular surgeon for possible proximal amputation. -Should patient have a wound VAC applied I discussed that I will be the only provider to change his wound VAC. -N.p.o. today for procedure -Discussed with patient that he needs to control his sugars better to offer better chance of healing.  Patient states that he keeps his sugars okay with his insulin  however he does seems to not understand that spiking of sugars from high to low is not conducive to healing process. -I attempted to call and update his mother at patient's request.  I was unable to reach her no voicemail left  Will continue to follow

## 2018-10-28 NOTE — Anesthesia Procedure Notes (Signed)
Procedure Name: Intubation Date/Time: 10/28/2018 2:39 PM Performed by: Bryson Corona, CRNA Pre-anesthesia Checklist: Patient identified, Emergency Drugs available, Suction available and Patient being monitored Patient Re-evaluated:Patient Re-evaluated prior to induction Oxygen Delivery Method: Circle System Utilized Preoxygenation: Pre-oxygenation with 100% oxygen Induction Type: IV induction Ventilation: Mask ventilation without difficulty Laryngoscope Size: Mac and 3 Grade View: Grade I Tube type: Oral Tube size: 7.0 mm Number of attempts: 1 Airway Equipment and Method: Stylet and Oral airway Placement Confirmation: ETT inserted through vocal cords under direct vision,  positive ETCO2 and breath sounds checked- equal and bilateral Secured at: 22 cm Tube secured with: Tape Dental Injury: Teeth and Oropharynx as per pre-operative assessment  Comments: Intubation performed by Debroah Loop, EMT

## 2018-10-28 NOTE — Progress Notes (Addendum)
[facility] PROGRESS NOTE    Progress Note  [name]  LZJ:[id number] DOB: [birth year] DOA: 10/26/2018 PCP: Patient, No Pcp Per     Brief Narrative:   [name] is an 38 y.o. male past medical history history significant for end-stage renal disease with dialysis Monday Wednesday Friday last dialysis was on the day of admission chronic bilateral wound infections diabetes mellitus, recently discharged on the day prior to admission with a wound VAC and oral antibiotics, the wound VAC nurse relates the wound VAC was not on on appropriately when she removed that some skin peeled off and it was very red.  The podiatrist was consulted who relates he would need an amputation but the patient has been reluctant to do so in the past.  Assessment/Plan:   Osteomyelitis of foot, left, acute ([name]/  Diabetic ulcer of toe of left foot associated with diabetes mellitus due to underlying condition, with necrosis of muscle ([name])/ left foot cellulitis: Patient was placed empirically on IV vancomycin and cefepime. He is not septic. The podiatrist and the patient is only amenable to great toe amputation although his likelihood of healing are significantly low. I have informed the patient recommended below the knee amputation as stated by vascular and podiatrist but he is adamant about proceeding with a below the knee amputation he just wants the part that is gangrenous taken off. Podiatrist has schedule him for surgery on 10/28/2018.  ESRD (end stage renal disease) on dialysis [facility]) Neurology has been consulted continue hemodialysis per nephrology.  Anemia of chronic disease Further management per renal.  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   DVT prophylaxis: lovenox Family Communication:none Disposition Plan/Barrier to D/C: unable to determine Code Status:     Code Status Orders    (From admission, onward)         Start     Ordered   10/26/18 1949  Full code  Continuous     10/26/18 1949        Code Status History    Date Active Date Inactive Code Status Order ID Comments User Context   10/20/2018 1730 10/25/2018 2038 Full Code [id number]  Modena Jansky, MD Inpatient   08/17/2018 2332 08/25/2018 1645 Full Code [id number]  Shela Leff, MD Inpatient   02/01/2018 1446 02/06/2018 0032 Full Code [id number]  Jule Ser, DO ED   10/25/2017 2225 11/03/2017 2322 Full Code [id number]  [name], MD ED   11/16/2016 1338 11/16/2016 1649 Full Code [id number]  Elam Dutch, MD Inpatient   05/27/2015 0707 05/29/2015 1809 Full Code [id number]  Harrie Foreman, MD Inpatient        IV Access:    Peripheral IV   Procedures and diagnostic studies:   Ct Foot Left W Contrast  Result Date: 10/27/2018 CLINICAL DATA:  Soft tissue swelling, cellulitis and skin lesion. Osteomyelitis is suspected. EXAM: CT OF THE LOWER LEFT EXTREMITY WITH CONTRAST TECHNIQUE: Multidetector CT imaging of the lower left extremity was performed according to the standard protocol following intravenous contrast administration. COMPARISON:  Plain radiographs dating back through 08/11/2018 of the foot and MRI from 08/18/2018. CONTRAST:  128mL OMNIPAQUE IOHEXOL 300 MG/ML  SOLN FINDINGS: Bones/Joint/Cartilage The patient is status post amputation of the fifth ray sparing the base of the fifth metatarsal. The surgical margin appears undulating and slightly irregular and the possibility of osteomyelitis is not excluded involving the amputation margin of the fifth metatarsal  base. The distal tibia and fibula, tibiotalar, subtalar and midfoot articulations are maintained. No bone destruction or fracture of the forefoot is identified despite soft tissue changes described below. Ligaments Suboptimally assessed by CT. Muscles and Tendons The tendons crossing the ankle and forefoot are intact without  evidence of tenosynovitis. No pyomyositis. Generalized diffuse disuse atrophy of the musculature of the foot. Soft tissues Soft tissue wound over the dorsum of the forefoot between the great and second toes with soft tissue emphysema identified over the dorsum of the forefoot. No focal fluid collection to suggest abscess. No CT evidence of acute osteomyelitis of the adjacent forefoot deep to the soft tissue. IMPRESSION: 1. The patient is status post amputation of the fifth ray sparing the base of the fifth metatarsal. The surgical margin appears undulating and slightly irregular and the possibility of osteomyelitis is not excluded at the surgical margin. 2. Soft tissue wound over the dorsum of the forefoot between the great and second toes with soft tissue emphysema over the dorsum of the forefoot. No focal fluid collection to suggest abscess. No adjacent osteomyelitis is identified. Electronically Signed   By: Ashley Royalty M.D.   On: 10/27/2018 21:24   Dg Foot Complete Left  Result Date: 10/26/2018 CLINICAL DATA:  Left foot pain. EXAM: LEFT FOOT - COMPLETE 3+ VIEW COMPARISON:  Radiographs of October 20, 2018. FINDINGS: Status post surgical resection of most of the fifth metatarsal. Vascular calcifications are noted. No acute fracture or dislocation is noted. There remains indistinct margins involving the residual portion of the fifth metatarsal as described on prior exam. IMPRESSION: Postsurgical changes as described above. Residual irregularity involving the surgical margins of the residual portion of fifth metatarsal as described on prior exam. The possibility of osteomyelitis can not be excluded. Electronically Signed   By: Marijo Conception, M.D.   On: 10/26/2018 17:49     Medical Consultants:    None.  Anti-Infectives:   IV Unasyn  Subjective:    [name] he relates he is in no pain. Objective:    Vitals:   10/28/18 0800 10/28/18 0830 10/28/18 0900 10/28/18 0930  BP: 140/83 (!)  145/84 (!) 153/87 (!) 155/84  Pulse: 83 83 87 84  Resp:      Temp:      TempSrc:      SpO2:      Weight:        Intake/Output Summary (Last 24 hours) at 10/28/2018 1010 Last data filed at 10/28/2018 0600 Gross per 24 hour  Intake 2403 ml  Output 0 ml  Net 2403 ml   Filed Weights   10/27/18 2143 10/28/18 0720  Weight: 81.5 kg 82.4 kg    Exam: General exam: In no acute distress. Respiratory system: Good air movement and clear to auscultation. Cardiovascular system: S1 & S2 heard, RRR.  Gastrointestinal system: Abdomen is nondistended, soft and nontender.  Central nervous system: Alert and oriented. No focal neurological deficits. Extremities: No pedal edema. Skin: Purplish great toe with mild odor swollen. Psychiatry: Patient has poor insight on medical condition.   Data Reviewed:    Labs: Basic Metabolic Panel: Recent Labs  Lab 10/22/18 1233 10/24/18 0826 10/26/18 1716 10/27/18 0426 10/28/18 0800  NA 130* 131* 132* 132* 132*  K 3.3* 3.5 3.8 4.1 4.8  CL 89* 95* 91* 91* 94*  CO2 26 21* 26 29 26   GLUCOSE 141* 296* 203* 405* 211*  BUN 35* 41* 13 20 33*  CREATININE 8.14* 9.39* 5.83* 7.01*  9.01*  CALCIUM 8.9 8.4* 9.4 9.1 9.2  PHOS  --  3.7  --   --  5.9*   GFR Estimated Creatinine Clearance: 11.1 mL/min (A) (by C-G formula based on SCr of 9.01 mg/dL (H)). Liver Function Tests: Recent Labs  Lab 10/24/18 0826 10/26/18 1716 10/28/18 0800  AST  --  23  --   ALT  --  5  --   ALKPHOS  --  112  --   BILITOT  --  0.7  --   PROT  --  7.3  --   ALBUMIN 2.2* 2.5* 2.1*   No results for input(s): LIPASE, AMYLASE in the last 168 hours. No results for input(s): AMMONIA in the last 168 hours. Coagulation profile No results for input(s): INR, PROTIME in the last 168 hours.  CBC: Recent Labs  Lab 10/21/18 1022 10/22/18 1233 10/24/18 0826 10/26/18 1716 10/27/18 0426  WBC 22.7* 17.8* 3.9* 13.4* 9.6  NEUTROABS  --   --   --  11.0*  --   HGB 11.5* 11.4* 11.2*  10.0* 9.4*  HCT 36.1* 36.6* 36.2* 33.3* 30.8*  MCV 93.5 93.4 93.5 96.8 96.6  PLT 151 191 153 182 212   Cardiac Enzymes: No results for input(s): CKTOTAL, CKMB, CKMBINDEX, TROPONINI in the last 168 hours. BNP (last 3 results) No results for input(s): PROBNP in the last 8760 hours. CBG: Recent Labs  Lab 10/27/18 1038 10/27/18 1617 10/27/18 2140 10/27/18 2331 10/28/18 0546  GLUCAP 287* 150* 50* 106* 257*   D-Dimer: No results for input(s): DDIMER in the last 72 hours. Hgb A1c: No results for input(s): HGBA1C in the last 72 hours. Lipid Profile: No results for input(s): CHOL, HDL, LDLCALC, TRIG, CHOLHDL, LDLDIRECT in the last 72 hours. Thyroid function studies: No results for input(s): TSH, T4TOTAL, T3FREE, THYROIDAB in the last 72 hours.  Invalid input(s): FREET3 Anemia work up: No results for input(s): VITAMINB12, FOLATE, FERRITIN, TIBC, IRON, RETICCTPCT in the last 72 hours. Sepsis Labs: Recent Labs  Lab 10/22/18 1233 10/24/18 0826 10/26/18 1716 10/26/18 1822 10/27/18 0426  WBC 17.8* 3.9* 13.4*  --  9.6  LATICACIDVEN  --   --  2.6* 2.0*  --    Microbiology Recent Results (from the past 240 hour(s))  WOUND CULTURE     Status: Abnormal   Collection Time: 10/19/18  3:15 PM  Result Value Ref Range Status   MICRO NUMBER: [id number]  Final   SPECIMEN QUALITY: Adequate  Final   SOURCE: LEFT FOOT LATERAL  Final   STATUS: FINAL  Final   GRAM STAIN:   Final    No white blood cells seen No epithelial cells seen Few Gram positive cocci in pairs   ISOLATE 1: Staphylococcus aureus (A)  Final    Comment: Heavy growth of Staphylococcus aureus      Susceptibility   Staphylococcus aureus - AEROBIC CULT, GRAM STAIN POSITIVE 1    VANCOMYCIN <=0.5 Sensitive     CIPROFLOXACIN <=0.5 Sensitive     CLINDAMYCIN <=0.25 Sensitive     LEVOFLOXACIN <=0.12 Sensitive     ERYTHROMYCIN <=0.25 Sensitive     GENTAMICIN <=0.5 Sensitive     OXACILLIN* <=0.25 Sensitive      *  Oxacillin-susceptible staphylococci aresusceptible to other penicillinase-stablepenicillins (e.g. Methicillin, Nafcillin), beta-lactam/beta-lactamase inhibitor combinations, andcephems with staphylococcal indications, includingCefazolin.    TETRACYCLINE <=1 Sensitive     TRIMETH/SULFA* <=10 Sensitive      * Oxacillin-susceptible staphylococci aresusceptible to other penicillinase-stablepenicillins (e.g. Methicillin, Nafcillin), beta-lactam/beta-lactamase inhibitor  combinations, andcephems with staphylococcal indications, includingCefazolin.Legend:S = Susceptible  I = IntermediateR = Resistant  NS = Not susceptible* = Not tested  NR = Not reported**NN = See antimicrobic comments  Blood Culture (routine x 2)     Status: None   Collection Time: 10/20/18 12:13 PM  Result Value Ref Range Status   Specimen Description BLOOD RIGHT ANTECUBITAL  Final   Special Requests   Final    BOTTLES DRAWN AEROBIC AND ANAEROBIC Blood Culture adequate volume   Culture   Final    NO GROWTH 5 DAYS Performed at [facility], [place] [address], [place], [place] [address]    Report Status 10/25/2018 FINAL  Final  Gastrointestinal Panel by PCR , Stool     Status: None   Collection Time: 10/20/18 12:26 PM  Result Value Ref Range Status   Campylobacter species NOT DETECTED NOT DETECTED Final   Plesimonas shigelloides NOT DETECTED NOT DETECTED Final   Salmonella species NOT DETECTED NOT DETECTED Final   Yersinia enterocolitica NOT DETECTED NOT DETECTED Final   Vibrio species NOT DETECTED NOT DETECTED Final   Vibrio cholerae NOT DETECTED NOT DETECTED Final   Enteroaggregative E coli (EAEC) NOT DETECTED NOT DETECTED Final   Enteropathogenic E coli (EPEC) NOT DETECTED NOT DETECTED Final   Enterotoxigenic E coli (ETEC) NOT DETECTED NOT DETECTED Final   Shiga like toxin producing E coli (STEC) NOT DETECTED NOT DETECTED Final   Shigella/Enteroinvasive E coli (EIEC) NOT DETECTED NOT DETECTED Final   Cryptosporidium NOT  DETECTED NOT DETECTED Final   Cyclospora cayetanensis NOT DETECTED NOT DETECTED Final   Entamoeba histolytica NOT DETECTED NOT DETECTED Final   Giardia lamblia NOT DETECTED NOT DETECTED Final   Adenovirus F40/41 NOT DETECTED NOT DETECTED Final   Astrovirus NOT DETECTED NOT DETECTED Final   Norovirus GI/GII NOT DETECTED NOT DETECTED Final   Rotavirus A NOT DETECTED NOT DETECTED Final   Sapovirus (I, II, IV, and V) NOT DETECTED NOT DETECTED Final    Comment: Performed at [facility], [address] [place], [place] [address]  C difficile quick scan w PCR reflex     Status: None   Collection Time: 10/20/18 12:26 PM  Result Value Ref Range Status   C Diff antigen NEGATIVE NEGATIVE Final   C Diff toxin NEGATIVE NEGATIVE Final   C Diff interpretation No C. difficile detected.  Final    Comment: Performed at [facility], [place] [address] [place], [place] [address]  Blood Culture (routine x 2)     Status: None   Collection Time: 10/20/18  1:37 PM  Result Value Ref Range Status   Specimen Description BLOOD RIGHT HAND  Final   Special Requests   Final    BOTTLES DRAWN AEROBIC AND ANAEROBIC Blood Culture adequate volume   Culture   Final    NO GROWTH 5 DAYS Performed at [facility], [place] [address], [place], [place] [address]    Report Status 10/25/2018 FINAL  Final  Aerobic/Anaerobic Culture (surgical/deep wound)     Status: None   Collection Time: 10/20/18  8:37 PM  Result Value Ref Range Status   Specimen Description ABSCESS LEFT FOOT  Final   Special Requests NONE  Final   Gram Stain   Final    FEW WBC PRESENT, PREDOMINANTLY MONONUCLEAR RARE GRAM POSITIVE COCCI    Culture   Final    MODERATE STAPHYLOCOCCUS AUREUS NO ANAEROBES ISOLATED Performed at [facility], [address] [place], [place] [address]    Report Status 10/25/2018 FINAL  Final   Organism ID, Bacteria STAPHYLOCOCCUS AUREUS  Final      Susceptibility   Staphylococcus aureus - MIC*     CIPROFLOXACIN <=0.5 SENSITIVE Sensitive     ERYTHROMYCIN <=0.25 SENSITIVE Sensitive     GENTAMICIN <=0.5 SENSITIVE Sensitive     OXACILLIN <=0.25 SENSITIVE Sensitive     TETRACYCLINE <=1 SENSITIVE Sensitive     VANCOMYCIN <=0.5 SENSITIVE Sensitive     TRIMETH/SULFA <=10 SENSITIVE Sensitive     CLINDAMYCIN <=0.25 SENSITIVE Sensitive     RIFAMPIN <=0.5 SENSITIVE Sensitive     Inducible Clindamycin NEGATIVE Sensitive     * MODERATE STAPHYLOCOCCUS AUREUS  Blood culture (routine x 2)     Status: None (Preliminary result)   Collection Time: 10/26/18  5:20 PM  Result Value Ref Range Status   Specimen Description BLOOD RIGHT ANTECUBITAL  Final   Special Requests   Final    BOTTLES DRAWN AEROBIC AND ANAEROBIC Blood Culture adequate volume   Culture   Final    NO GROWTH 2 DAYS Performed at [facility], [place] [address], [place], [place] [address]    Report Status PENDING  Incomplete  Blood culture (routine x 2)     Status: None (Preliminary result)   Collection Time: 10/26/18  5:25 PM  Result Value Ref Range Status   Specimen Description BLOOD RIGHT FOREARM  Final   Special Requests   Final    BOTTLES DRAWN AEROBIC AND ANAEROBIC Blood Culture adequate volume   Culture   Final    NO GROWTH 2 DAYS Performed at [facility], [place] [address], [place], [place] [address]    Report Status PENDING  Incomplete  MRSA PCR Screening     Status: None   Collection Time: 10/27/18  4:11 AM  Result Value Ref Range Status   MRSA by PCR NEGATIVE NEGATIVE Final    Comment:        The GeneXpert MRSA Assay (FDA approved for NASAL specimens only), is one component of a comprehensive MRSA colonization surveillance program. It is not intended to diagnose MRSA infection nor to guide or monitor treatment for MRSA infections. Performed at [facility], [place] [address], [place], [place] [address]      Medications:   . calcitRIOL  2.5 mcg Oral Q M,W,F-HD  . Chlorhexidine Gluconate Cloth   6 each Topical Q0600  . cinacalcet  30 mg Oral Q supper  . darbepoetin (ARANESP) injection - DIALYSIS  100 mcg Intravenous Q Fri-HD  . HYDROcodone-acetaminophen      . insulin aspart  0-5 Units Subcutaneous QHS  . insulin aspart  0-9 Units Subcutaneous TID WC  . insulin glargine  15 Units Subcutaneous QHS  . multivitamin  1 tablet Oral QHS  . sevelamer carbonate  2,400 mg Oral TID WC  . sodium chloride flush  3 mL Intravenous Q12H   Continuous Infusions: . sodium chloride    . sodium chloride    . sodium chloride    . ampicillin-sulbactam (UNASYN) IV 1.5 g (10/28/18 0055)  . ferric gluconate (FERRLECIT/NULECIT) IV        LOS: 2 days   Charlynne Cousins  [facility]  10/28/2018, 10:10 AM

## 2018-10-28 NOTE — Anesthesia Preprocedure Evaluation (Signed)
Anesthesia Evaluation  Patient identified by MRN, date of birth, ID band Patient awake    Reviewed: Allergy & Precautions, H&P , NPO status , Patient's Chart, lab work & pertinent test results  Airway Mallampati: II  TM Distance: >3 FB Neck ROM: Full    Dental no notable dental hx. (+) Teeth Intact, Dental Advisory Given   Pulmonary neg pulmonary ROS,    Pulmonary exam normal breath sounds clear to auscultation       Cardiovascular hypertension,  Rhythm:Regular Rate:Normal     Neuro/Psych negative neurological ROS  negative psych ROS   GI/Hepatic negative GI ROS, Neg liver ROS,   Endo/Other  diabetes, Type 1, Insulin Dependent  Renal/GU Dialysis and ESRFRenal disease  negative genitourinary   Musculoskeletal   Abdominal   Peds  Hematology  (+) Blood dyscrasia, anemia ,   Anesthesia Other Findings   Reproductive/Obstetrics negative OB ROS                             Anesthesia Physical  Anesthesia Plan  ASA: III  Anesthesia Plan: General   Post-op Pain Management:    Induction: Intravenous  PONV Risk Score and Plan: 3 and Ondansetron, Midazolam and Treatment may vary due to age or medical condition  Airway Management Planned: Oral ETT and LMA  Additional Equipment:   Intra-op Plan:   Post-operative Plan: Extubation in OR  Informed Consent: I have reviewed the patients History and Physical, chart, labs and discussed the procedure including the risks, benefits and alternatives for the proposed anesthesia with the patient or authorized representative who has indicated his/her understanding and acceptance.     Dental advisory given  Plan Discussed with: CRNA, Anesthesiologist and Surgeon  Anesthesia Plan Comments:         Anesthesia Quick Evaluation

## 2018-10-28 NOTE — Procedures (Signed)
I was present at this dialysis session. I have reviewed the session itself and made appropriate changes.   3K. UF Goal 5.5L.  Using AVF.  Tol procedure well.    Filed Weights   10/27/18 2143  Weight: 81.5 kg    Recent Labs  Lab 10/24/18 0826  10/27/18 0426  NA 131*   < > 132*  K 3.5   < > 4.1  CL 95*   < > 91*  CO2 21*   < > 29  GLUCOSE 296*   < > 405*  BUN 41*   < > 20  CREATININE 9.39*   < > 7.01*  CALCIUM 8.4*   < > 9.1  PHOS 3.7  --   --    < > = values in this interval not displayed.    Recent Labs  Lab 10/24/18 0826 10/26/18 1716 10/27/18 0426  WBC 3.9* 13.4* 9.6  NEUTROABS  --  11.0*  --   HGB 11.2* 10.0* 9.4*  HCT 36.2* 33.3* 30.8*  MCV 93.5 96.8 96.6  PLT 153 182 212    Scheduled Meds: . calcitRIOL  2.5 mcg Oral Q M,W,F-HD  . Chlorhexidine Gluconate Cloth  6 each Topical Q0600  . cinacalcet  30 mg Oral Q supper  . darbepoetin (ARANESP) injection - DIALYSIS  100 mcg Intravenous Q Fri-HD  . insulin aspart  0-5 Units Subcutaneous QHS  . insulin aspart  0-9 Units Subcutaneous TID WC  . insulin glargine  15 Units Subcutaneous QHS  . multivitamin  1 tablet Oral QHS  . sevelamer carbonate  2,400 mg Oral TID WC  . sodium chloride flush  3 mL Intravenous Q12H   Continuous Infusions: . sodium chloride    . sodium chloride    . sodium chloride    . ampicillin-sulbactam (UNASYN) IV 1.5 g (10/28/18 0055)  . ferric gluconate (FERRLECIT/NULECIT) IV     PRN Meds:.sodium chloride, sodium chloride, sodium chloride, alteplase, clonazePAM, heparin, heparin, HYDROcodone-acetaminophen, lidocaine (PF), lidocaine-prilocaine, ondansetron **OR** ondansetron (ZOFRAN) IV, pentafluoroprop-tetrafluoroeth, sodium chloride flush   Pearson Grippe  MD 10/28/2018, 8:29 AM

## 2018-10-28 NOTE — Progress Notes (Signed)
Inpatient Diabetes Program Recommendations  AACE/ADA: New Consensus Statement on Inpatient Glycemic Control (2015)  Target Ranges:  Prepandial:   less than 140 mg/dL      Peak postprandial:   less than 180 mg/dL (1-2 hours)      Critically ill patients:  140 - 180 mg/dL   Lab Results  Component Value Date   GLUCAP 257 (H) 10/28/2018   HGBA1C 6.7 (H) 10/23/2018    Review of Glycemic Control Results for [name], [name] (MRN [id number]) as of 10/28/2018 09:32  Ref. Range 10/27/2018 16:17 10/27/2018 21:40 10/27/2018 23:31 10/28/2018 05:46  Glucose-Capillary Latest Ref Range: 70 - 99 mg/dL 150 (H) 50 (L) 106 (H) 257 (H)  Type 1 DM  Home DM meds: Lantus 15 units q hs                              Novolog 6-7 units bid with meals  Current DM meds: Lantus 15 units q hs                                Novolog sensitive scale (0-9 units) tid and hs  Inpatient Diabetes Program Recommendations:    Looks ok- had low last night. Unclear if patient is still taking his own insulin and if this may have contributed to low?  NPO today.  Will follow.   Thanks  Adah Perl, RN, BC-ADM Inpatient Diabetes Coordinator Pager [phone] (8a-5p)

## 2018-10-28 NOTE — Telephone Encounter (Signed)
Dr. Venetia Constable called to speak with Dr. March Rummage concerning pt.

## 2018-10-28 NOTE — Brief Op Note (Signed)
10/26/2018 - 10/28/2018  3:35 PM  PATIENT:  [name]  38 y.o. male  PRE-OPERATIVE DIAGNOSIS:  left great toe gangrene  POST-OPERATIVE DIAGNOSIS:  left great toe gangrene  PROCEDURE:  Procedure(s): LEFT GREAT TOE AMPUTATION (Left) IRRIGATION AND DEBRIDEMENT EXTREMITY (Left) APPLICATION OF WOUND VAC (Left)  SURGEON:  Surgeon(s) and Role:    * Evelina Bucy, DPM - Primary  PHYSICIAN ASSISTANT:   ASSISTANTS: none   ANESTHESIA:   general  EBL:  10 ml   BLOOD ADMINISTERED:none  DRAINS: Wound VAC   LOCAL MEDICATIONS USED:  MARCAINE    and Amount: 10 ml  SPECIMEN:   ID Type Source Tests Collected by Time Destination  1 : LEFT GREAT TOE Amputation Toe, Left SURGICAL PATHOLOGY Evelina Bucy, DPM 10/28/2018 1500   A : WOUND CULTURE DEEP SOFT TISSUE Tissue Wound AEROBIC/ANAEROBIC CULTURE (SURGICAL/DEEP WOUND) Evelina Bucy, DPM 10/28/2018 1505    COUNTS:  YES  TOURNIQUET:  * No tourniquets in log *  DICTATION: .Dragon Dictation  PLAN OF CARE: Transfer to floor  PATIENT DISPOSITION:  PACU - hemodynamically stable.   Delay start of Pharmacological VTE agent (>24hrs) due to surgical blood loss or risk of bleeding: not applicable

## 2018-10-28 NOTE — Anesthesia Postprocedure Evaluation (Signed)
Anesthesia Post Note  Patient: [name]  Procedure(s) Performed: LEFT GREAT TOE AMPUTATION (Left Toe) IRRIGATION AND DEBRIDEMENT LEFT TOE (Left Toe) APPLICATION OF WOUND VAC LEFT TOE (Left Toe)     Patient location during evaluation: PACU Anesthesia Type: General Level of consciousness: awake and alert Pain management: pain level controlled Vital Signs Assessment: post-procedure vital signs reviewed and stable Respiratory status: spontaneous breathing, nonlabored ventilation, respiratory function stable and patient connected to nasal cannula oxygen Cardiovascular status: blood pressure returned to baseline and stable Postop Assessment: no apparent nausea or vomiting Anesthetic complications: no    Last Vitals:  Vitals:   10/28/18 1600 10/28/18 1615  BP: (!) 165/98   Pulse: 73 78  Resp: 13 19  Temp:    SpO2: 100% 100%    Last Pain:  Vitals:   10/28/18 1600  TempSrc:   PainSc: 8                  Cinthia Rodden

## 2018-10-29 DIAGNOSIS — I129 Hypertensive chronic kidney disease with stage 1 through stage 4 chronic kidney disease, or unspecified chronic kidney disease: Secondary | ICD-10-CM | POA: Diagnosis not present

## 2018-10-29 DIAGNOSIS — E11628 Type 2 diabetes mellitus with other skin complications: Secondary | ICD-10-CM

## 2018-10-29 DIAGNOSIS — Z992 Dependence on renal dialysis: Secondary | ICD-10-CM | POA: Diagnosis not present

## 2018-10-29 DIAGNOSIS — N186 End stage renal disease: Secondary | ICD-10-CM | POA: Diagnosis not present

## 2018-10-29 LAB — GLUCOSE, CAPILLARY
Glucose-Capillary: 127 mg/dL — ABNORMAL HIGH (ref 70–99)
Glucose-Capillary: 139 mg/dL — ABNORMAL HIGH (ref 70–99)
Glucose-Capillary: 225 mg/dL — ABNORMAL HIGH (ref 70–99)
Glucose-Capillary: 265 mg/dL — ABNORMAL HIGH (ref 70–99)
Glucose-Capillary: 48 mg/dL — ABNORMAL LOW (ref 70–99)
Glucose-Capillary: 50 mg/dL — ABNORMAL LOW (ref 70–99)
Glucose-Capillary: 66 mg/dL — ABNORMAL LOW (ref 70–99)
Glucose-Capillary: 66 mg/dL — ABNORMAL LOW (ref 70–99)

## 2018-10-29 MED ORDER — MENTHOL 3 MG MT LOZG
1.0000 | LOZENGE | OROMUCOSAL | Status: DC | PRN
  Filled 2018-10-29: qty 9

## 2018-10-29 MED ORDER — GLUCOSE 40 % PO GEL
ORAL | Status: AC
  Administered 2018-10-29: 37.5 g
  Filled 2018-10-29: qty 1

## 2018-10-29 MED ORDER — INSULIN GLARGINE 100 UNIT/ML ~~LOC~~ SOLN
10.0000 [IU] | Freq: Two times a day (BID) | SUBCUTANEOUS | Status: DC
  Administered 2018-10-29 – 2018-10-30 (×4): 10 [IU] via SUBCUTANEOUS
  Filled 2018-10-29 (×5): qty 0.1

## 2018-10-29 MED ORDER — DEXTROSE 50 % IV SOLN
INTRAVENOUS | Status: AC
  Administered 2018-10-29: 50 mL
  Filled 2018-10-29: qty 50

## 2018-10-29 MED ORDER — MORPHINE SULFATE (PF) 2 MG/ML IV SOLN: 1.0000 mg | Freq: Once | INTRAVENOUS | Status: DC

## 2018-10-29 NOTE — Progress Notes (Signed)
[facility] PROGRESS NOTE    Progress Note  [name]  DZH:[id number] DOB: Jan 03, 1981 DOA: 10/26/2018 PCP: Patient, No Pcp Per     Brief Narrative:   [name] is an 38 y.o. male past medical history history significant for end-stage renal disease with dialysis Monday Wednesday Friday last dialysis was on the day of admission chronic bilateral wound infections diabetes mellitus, recently discharged on the day prior to admission with a wound VAC and oral antibiotics, the wound VAC nurse relates the wound VAC was not on on appropriately when she removed that some skin peeled off and it was very red.  The podiatrist was consulted who relates he would need an amputation but the patient has been reluctant to do so in the past.  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  Has remained afebrile with no leukocytosis. Status post surgical intervention on 10/28/2018 with a left great toe amputation with I&D, wound VAC was placed.  Further management per podiatrist Dr. March Rummage.  ESRD (end stage renal disease) on dialysis [facility]) Further management per nephrology.  Anemia of chronic disease Further management per renal.  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 We will lower long-acting insulin.   DVT prophylaxis: lovenox Family Communication:none Disposition Plan/Barrier to D/C: unable to determine Code Status:     Code Status Orders  (From admission, onward)         Start     Ordered   10/26/18 1949  Full code  Continuous     10/26/18 1949        Code Status History    Date Active Date Inactive Code Status Order ID Comments User Context   10/20/2018 1730 10/25/2018 2038  Full Code [id number]  Modena Jansky, MD Inpatient   08/17/2018 2332 08/25/2018 1645 Full Code [id number]  Shela Leff, MD Inpatient   02/01/2018 1446 02/06/2018 0032 Full Code [id number]  Jule Ser, DO ED   10/25/2017 2225 11/03/2017 2322 Full Code [id number]  [name], MD ED   11/16/2016 1338 11/16/2016 1649 Full Code [id number]  Elam Dutch, MD Inpatient   05/27/2015 0707 05/29/2015 1809 Full Code [id number]  Harrie Foreman, MD Inpatient        IV Access:    Peripheral IV   Procedures and diagnostic studies:   Ct Foot Left W Contrast  Result Date: 10/27/2018 CLINICAL DATA:  Soft tissue swelling, cellulitis and skin lesion. Osteomyelitis is suspected. EXAM: CT OF THE LOWER LEFT EXTREMITY WITH CONTRAST TECHNIQUE: Multidetector CT imaging of the lower left extremity was performed according to the standard protocol following intravenous contrast administration. COMPARISON:  Plain radiographs dating back through 08/11/2018 of the foot and MRI from 08/18/2018. CONTRAST:  170mL OMNIPAQUE IOHEXOL 300 MG/ML  SOLN FINDINGS: Bones/Joint/Cartilage The patient is status post amputation of the fifth ray sparing the base of the fifth metatarsal. The surgical margin appears undulating and slightly irregular and the possibility of osteomyelitis is not excluded involving the amputation margin of the fifth metatarsal base. The distal tibia and fibula, tibiotalar, subtalar and midfoot articulations are maintained. No bone destruction or fracture of the forefoot is identified despite soft tissue changes described below. Ligaments Suboptimally assessed by CT. Muscles and Tendons The tendons crossing  the ankle and forefoot are intact without evidence of tenosynovitis. No pyomyositis. Generalized diffuse disuse atrophy of the musculature of the foot. Soft tissues Soft tissue wound over the dorsum of the forefoot between the great and second toes with soft tissue emphysema identified over the dorsum  of the forefoot. No focal fluid collection to suggest abscess. No CT evidence of acute osteomyelitis of the adjacent forefoot deep to the soft tissue. IMPRESSION: 1. The patient is status post amputation of the fifth ray sparing the base of the fifth metatarsal. The surgical margin appears undulating and slightly irregular and the possibility of osteomyelitis is not excluded at the surgical margin. 2. Soft tissue wound over the dorsum of the forefoot between the great and second toes with soft tissue emphysema over the dorsum of the forefoot. No focal fluid collection to suggest abscess. No adjacent osteomyelitis is identified. Electronically Signed   By: Ashley Royalty M.D.   On: 10/27/2018 21:24   Dg Foot 2 Views Left  Result Date: 10/28/2018 CLINICAL DATA:  Status post amputation of the great toe. Wound VAC in place. EXAM: LEFT FOOT - 2 VIEW COMPARISON:  Radiographs dated 10/26/2018 FINDINGS: The patient has undergone interval resection of the great toe. Wound VAC in place. Prior amputation of fifth digit and of the majority of the fifth metatarsal with a remnant of the base. There is soft tissue swelling adjacent to the first metatarsal. No gas in the soft tissues. Chronic arterial calcifications in the left lower leg and foot. IMPRESSION: Satisfactory appearance of the left foot after amputation of the great toe. Electronically Signed   By: Lorriane Shire M.D.   On: 10/28/2018 17:58     Medical Consultants:    None.  Anti-Infectives:   IV Unasyn  Subjective:    [name] complaining of foot pain. Objective:    Vitals:   10/28/18 1630 10/28/18 1648 10/28/18 2050 10/29/18 0429  BP: (!) 145/83 (!) 160/94 139/69 (!) 147/104  Pulse: 70 74 86 79  Resp: (!) 8  16 18   Temp: 97.7 F (36.5 C)  98.4 F (36.9 C) 98.4 F (36.9 C)  TempSrc:   Oral Oral  SpO2: 97% 100% 100% 100%  Weight:      Height:        Intake/Output Summary (Last 24 hours) at 10/29/2018 0813 Last data filed at  10/29/2018 0300 Gross per 24 hour  Intake 980 ml  Output 6500 ml  Net -5520 ml   Filed Weights   10/27/18 2143 10/28/18 0720 10/28/18 1235  Weight: 81.5 kg 82.4 kg 75.3 kg    Exam: General exam: In no acute distress. Respiratory system: Good air movement and clear to auscultation. Cardiovascular system: S1 & S2 heard, RRR.  Gastrointestinal system: Abdomen is nondistended, soft and nontender.  Central nervous system: Alert and oriented. No focal neurological deficits. Extremities: No pedal edema. Skin: Purplish great toe with mild odor swollen. Psychiatry: Patient has poor insight on medical condition.   Data Reviewed:    Labs: Basic Metabolic Panel: Recent Labs  Lab 10/22/18 1233 10/24/18 0826 10/26/18 1716 10/27/18 0426 10/28/18 0800  NA 130* 131* 132* 132* 132*  K 3.3* 3.5 3.8 4.1 4.8  CL 89* 95* 91* 91* 94*  CO2 26 21* 26 29 26   GLUCOSE 141* 296* 203* 405* 211*  BUN 35* 41* 13 20 33*  CREATININE 8.14* 9.39* 5.83* 7.01* 9.01*  CALCIUM 8.9 8.4* 9.4 9.1 9.2  PHOS  --  3.7  --   --  5.9*   GFR Estimated Creatinine Clearance: 9.9 mL/min (A) (by C-G formula based on SCr of 9.01 mg/dL (H)). Liver Function Tests: Recent Labs  Lab 10/24/18 0826 10/26/18 1716 10/28/18 0800  AST  --  23  --   ALT  --  5  --   ALKPHOS  --  112  --   BILITOT  --  0.7  --   PROT  --  7.3  --   ALBUMIN 2.2* 2.5* 2.1*   No results for input(s): LIPASE, AMYLASE in the last 168 hours. No results for input(s): AMMONIA in the last 168 hours. Coagulation profile No results for input(s): INR, PROTIME in the last 168 hours.  CBC: Recent Labs  Lab 10/22/18 1233 10/24/18 0826 10/26/18 1716 10/27/18 0426  WBC 17.8* 3.9* 13.4* 9.6  NEUTROABS  --   --  11.0*  --   HGB 11.4* 11.2* 10.0* 9.4*  HCT 36.6* 36.2* 33.3* 30.8*  MCV 93.4 93.5 96.8 96.6  PLT 191 153 182 212   Cardiac Enzymes: No results for input(s): CKTOTAL, CKMB, CKMBINDEX, TROPONINI in the last 168 hours. BNP (last 3  results) No results for input(s): PROBNP in the last 8760 hours. CBG: Recent Labs  Lab 10/28/18 1256 10/28/18 1634 10/28/18 1957 10/29/18 0722 10/29/18 0727  GLUCAP 97 105* 299* 50* 48*   D-Dimer: No results for input(s): DDIMER in the last 72 hours. Hgb A1c: No results for input(s): HGBA1C in the last 72 hours. Lipid Profile: No results for input(s): CHOL, HDL, LDLCALC, TRIG, CHOLHDL, LDLDIRECT in the last 72 hours. Thyroid function studies: No results for input(s): TSH, T4TOTAL, T3FREE, THYROIDAB in the last 72 hours.  Invalid input(s): FREET3 Anemia work up: No results for input(s): VITAMINB12, FOLATE, FERRITIN, TIBC, IRON, RETICCTPCT in the last 72 hours. Sepsis Labs: Recent Labs  Lab 10/22/18 1233 10/24/18 0826 10/26/18 1716 10/26/18 1822 10/27/18 0426  WBC 17.8* 3.9* 13.4*  --  9.6  LATICACIDVEN  --   --  2.6* 2.0*  --    Microbiology Recent Results (from the past 240 hour(s))  WOUND CULTURE     Status: Abnormal   Collection Time: 10/19/18  3:15 PM  Result Value Ref Range Status   MICRO NUMBER: [id number]  Final   SPECIMEN QUALITY: Adequate  Final   SOURCE: LEFT FOOT LATERAL  Final   STATUS: FINAL  Final   GRAM STAIN:   Final    No white blood cells seen No epithelial cells seen Few Gram positive cocci in pairs   ISOLATE 1: Staphylococcus aureus (A)  Final    Comment: Heavy growth of Staphylococcus aureus      Susceptibility   Staphylococcus aureus - AEROBIC CULT, GRAM STAIN POSITIVE 1    VANCOMYCIN <=0.5 Sensitive     CIPROFLOXACIN <=0.5 Sensitive     CLINDAMYCIN <=0.25 Sensitive     LEVOFLOXACIN <=0.12 Sensitive     ERYTHROMYCIN <=0.25 Sensitive     GENTAMICIN <=0.5 Sensitive     OXACILLIN* <=0.25 Sensitive      * Oxacillin-susceptible staphylococci aresusceptible to other penicillinase-stablepenicillins (e.g. Methicillin, Nafcillin), beta-lactam/beta-lactamase inhibitor combinations, andcephems with staphylococcal indications, includingCefazolin.     TETRACYCLINE <=1 Sensitive     TRIMETH/SULFA* <=10 Sensitive      * Oxacillin-susceptible staphylococci aresusceptible to other penicillinase-stablepenicillins (e.g. Methicillin, Nafcillin), beta-lactam/beta-lactamase inhibitor combinations, andcephems with staphylococcal indications, includingCefazolin.Legend:S = Susceptible  I = IntermediateR = Resistant  NS = Not susceptible* = Not tested  NR = Not reported**NN = See antimicrobic  comments  Blood Culture (routine x 2)     Status: None   Collection Time: 10/20/18 12:13 PM  Result Value Ref Range Status   Specimen Description BLOOD RIGHT ANTECUBITAL  Final   Special Requests   Final    BOTTLES DRAWN AEROBIC AND ANAEROBIC Blood Culture adequate volume   Culture   Final    NO GROWTH 5 DAYS Performed at [facility], [address], [place], [place] [address]    Report Status 10/25/2018 FINAL  Final  Gastrointestinal Panel by PCR , Stool     Status: None   Collection Time: 10/20/18 12:26 PM  Result Value Ref Range Status   Campylobacter species NOT DETECTED NOT DETECTED Final   Plesimonas shigelloides NOT DETECTED NOT DETECTED Final   Salmonella species NOT DETECTED NOT DETECTED Final   Yersinia enterocolitica NOT DETECTED NOT DETECTED Final   Vibrio species NOT DETECTED NOT DETECTED Final   Vibrio cholerae NOT DETECTED NOT DETECTED Final   Enteroaggregative E coli (EAEC) NOT DETECTED NOT DETECTED Final   Enteropathogenic E coli (EPEC) NOT DETECTED NOT DETECTED Final   Enterotoxigenic E coli (ETEC) NOT DETECTED NOT DETECTED Final   Shiga like toxin producing E coli (STEC) NOT DETECTED NOT DETECTED Final   Shigella/Enteroinvasive E coli (EIEC) NOT DETECTED NOT DETECTED Final   Cryptosporidium NOT DETECTED NOT DETECTED Final   Cyclospora cayetanensis NOT DETECTED NOT DETECTED Final   Entamoeba histolytica NOT DETECTED NOT DETECTED Final   Giardia lamblia NOT DETECTED NOT DETECTED Final   Adenovirus F40/41 NOT DETECTED NOT DETECTED  Final   Astrovirus NOT DETECTED NOT DETECTED Final   Norovirus GI/GII NOT DETECTED NOT DETECTED Final   Rotavirus A NOT DETECTED NOT DETECTED Final   Sapovirus (I, II, IV, and V) NOT DETECTED NOT DETECTED Final    Comment: Performed at [facility], [address] [place], [place] [address]  C difficile quick scan w PCR reflex     Status: None   Collection Time: 10/20/18 12:26 PM  Result Value Ref Range Status   C Diff antigen NEGATIVE NEGATIVE Final   C Diff toxin NEGATIVE NEGATIVE Final   C Diff interpretation No C. difficile detected.  Final    Comment: Performed at [facility], [place] [address], [place], [place] [address]  Blood Culture (routine x 2)     Status: None   Collection Time: 10/20/18  1:37 PM  Result Value Ref Range Status   Specimen Description BLOOD RIGHT HAND  Final   Special Requests   Final    BOTTLES DRAWN AEROBIC AND ANAEROBIC Blood Culture adequate volume   Culture   Final    NO GROWTH 5 DAYS Performed at [facility], [place] [address] [place], [place] [address]    Report Status 10/25/2018 FINAL  Final  Aerobic/Anaerobic Culture (surgical/deep wound)     Status: None   Collection Time: 10/20/18  8:37 PM  Result Value Ref Range Status   Specimen Description ABSCESS LEFT FOOT  Final   Special Requests NONE  Final   Gram Stain   Final    FEW WBC PRESENT, PREDOMINANTLY MONONUCLEAR RARE GRAM POSITIVE COCCI    Culture   Final    MODERATE STAPHYLOCOCCUS AUREUS NO ANAEROBES ISOLATED Performed at [facility], [place] [address] [place],  [address]    Report Status 10/25/2018 FINAL  Final   Organism ID, Bacteria STAPHYLOCOCCUS AUREUS  Final      Susceptibility  Staphylococcus aureus - MIC*    CIPROFLOXACIN <=0.5 SENSITIVE Sensitive     ERYTHROMYCIN <=0.25 SENSITIVE Sensitive     GENTAMICIN <=0.5 SENSITIVE Sensitive     OXACILLIN <=0.25 SENSITIVE Sensitive     TETRACYCLINE <=1 SENSITIVE Sensitive     VANCOMYCIN <=0.5  SENSITIVE Sensitive     TRIMETH/SULFA <=10 SENSITIVE Sensitive     CLINDAMYCIN <=0.25 SENSITIVE Sensitive     RIFAMPIN <=0.5 SENSITIVE Sensitive     Inducible Clindamycin NEGATIVE Sensitive     * MODERATE STAPHYLOCOCCUS AUREUS  Blood culture (routine x 2)     Status: None (Preliminary result)   Collection Time: 10/26/18  5:20 PM  Result Value Ref Range Status   Specimen Description BLOOD RIGHT ANTECUBITAL  Final   Special Requests   Final    BOTTLES DRAWN AEROBIC AND ANAEROBIC Blood Culture adequate volume Performed at [facility], [place] [address], [place], [place] [address]    Culture NO GROWTH 3 DAYS  Final   Report Status PENDING  Incomplete  Blood culture (routine x 2)     Status: None (Preliminary result)   Collection Time: 10/26/18  5:25 PM  Result Value Ref Range Status   Specimen Description BLOOD RIGHT FOREARM  Final   Special Requests   Final    BOTTLES DRAWN AEROBIC AND ANAEROBIC Blood Culture adequate volume Performed at [facility], [place] [address] [place], [place] [address]    Culture NO GROWTH 3 DAYS  Final   Report Status PENDING  Incomplete  MRSA PCR Screening     Status: None   Collection Time: 10/27/18  4:11 AM  Result Value Ref Range Status   MRSA by PCR NEGATIVE NEGATIVE Final    Comment:        The GeneXpert MRSA Assay (FDA approved for NASAL specimens only), is one component of a comprehensive MRSA colonization surveillance program. It is not intended to diagnose MRSA infection nor to guide or monitor treatment for MRSA infections. Performed at [facility], [place] [address], [place], [place] [address]   Aerobic/Anaerobic Culture (surgical/deep wound)     Status: None (Preliminary result)   Collection Time: 10/28/18  3:05 PM  Result Value Ref Range Status   Specimen Description TISSUE TOE LEFT  Final   Special Requests NONE  Final   Gram Stain   Final    NO WBC SEEN NO ORGANISMS SEEN Performed at [facility], [address], [place],  [address]    Culture PENDING  Incomplete   Report Status PENDING  Incomplete     Medications:   . calcitRIOL  2.5 mcg Oral Q M,W,F-HD  . Chlorhexidine Gluconate Cloth  6 each Topical Q0600  . cinacalcet  30 mg Oral Q supper  . darbepoetin (ARANESP) injection - DIALYSIS  100 mcg Intravenous Q Fri-HD  . insulin aspart  0-5 Units Subcutaneous QHS  . insulin aspart  0-9 Units Subcutaneous TID WC  . insulin glargine  15 Units Subcutaneous BID  . multivitamin  1 tablet Oral QHS  . sevelamer carbonate  2,400 mg Oral TID WC  . sodium chloride flush  3 mL Intravenous Q12H   Continuous Infusions: . sodium chloride    . sodium chloride 10 mL/hr at 10/28/18 1408  . ampicillin-sulbactam (UNASYN) IV 1.5 g (10/28/18 2145)  . ferric gluconate (FERRLECIT/NULECIT) IV 62.5 mg (10/28/18 1128)      LOS: 3 days   Charlynne Cousins  [facility]  10/29/2018, 8:13 AM

## 2018-10-29 NOTE — Op Note (Signed)
Patient Name: [name] DOB: 1980/10/07  MRN: [id number]   Date of Service: 10/28/2018   Surgeon: Dr. Hardie Pulley, DPM Assistants: None Pre-operative Diagnosis: Cellulitis and abscess of left foot, gangrene left great toe Post-operative Diagnosis: Same Procedures:             1) Incision and Drainage below the deep fascia  2) Amputation left great toe MPJ   Pathology/Specimens: ID Type Source Tests Collected by Time Destination  1 : LEFT GREAT TOE Amputation Toe, Left SURGICAL PATHOLOGY Evelina Bucy, DPM 10/28/2018 1500   A : WOUND CULTURE DEEP SOFT TISSUE Tissue Wound AEROBIC/ANAEROBIC CULTURE (SURGICAL/DEEP WOUND) Evelina Bucy, DPM 10/28/2018 1505    Anesthesia: General Hemostasis: Anatomic Estimated Blood Loss: 10 mL's Materials:  Implant Name Type Inv. Item Serial No. Manufacturer Lot No. LRB No. Used  CELLERATE RX 28gram Gel Collagen  [id number]   Left 1    Medications: 1g Vancomycin powder Complications: None  Indications for Procedure:  This is a 38 y.o. male who presented with an abscess to the left great toe.  He underwent incision and drainage of the abscess.  He underwent repeat debridement, with extensive tissue necrosis noted at that time.  Tissue necrosis was debrided.  He was discharged from the hospital with a wound VAC.  At home it was noted that the wound had worsened and the toe turned black.  It was deemed that the extent of tissue necrosis led to insufficient circulation to heal this toe.  Vascular was consulted and no reperfusion options were indicated.  It was discussed that he would most benefit from transmetatarsal or below-knee amputation.  He refused both of these options.  He instead only wishes to proceed with amputation of the gangrenous digit and to salvage the remainder of his foot.  Discussed the patient the poor healing potential associated with this but then we can proceed.  Discussed amputation of the digit with debridement of the ulceration.   All risk benefits in terms of surgery were discussed the patient no guarantees were given.   While not prospectively planned, the procedures today were more extensive than the procedures that he had previously.  Procedure in Detail: Patient was identified in pre-operative holding area. Formal consent was signed and the left lower extremity was marked. Patient was brought back to the operating room and placed on the operating room table in the supine position. Anesthesia was induced.    The extremity was prepped and draped in the usual sterile fashion. Timeout was taken to confirm patient name, laterality, and procedure prior to incision. Attention was then directed to the left foot.  The left hallux was noted to be gangrenous in appearance and cool to the touch.  There is a fluctuant abscess noted about the remainder of the lateral first metatarsal phalangeal joint.  The area was sharply incised and a hemostat was used to explore the area.  Purulence was encountered and was maximally expressed.  The purulence was thoroughly drained.  The surrounding tissue was sharply excisionally debrided with a rongeur to good bleeding wound base.  Attention was then directed to the gangrenous hallux.  The extensor tendon and remaining collateral ligaments were sharply incised.  The hallux was sharply disarticulated at the metatarsophalangeal joint.  The remaining metatarsal head appeared healthy and viable.  The wound was then copiously irrigated with 3 L of normal saline via pulse lavage.  The remaining wound measured 5 x 4 with viable appearing wound base.  Cellerate  collagen gel and 1 g of vancomycin powder were applied to the wound base to promote healing and resolution of infection.   A wound VAC was then applied. A white sponge was applied overlying the first metatarsal bone to protect it from desiccation.  A black sponge was applied over this and adhered with the occlusive dressing.   The foot was then  dressed with cast paddding and ACE bandage. Patient tolerated the procedure well.   Disposition: Following a period of post-operative monitoring, patient will be transferred back to the floor.  The wound did bleed post debridement which could be indicative of healing potential.  This wound will prove difficult to salvage.  He will likely need repeat debridement in operating room and wound VAC changes. The best he can hope for is that the wound will granulate in and we can cover this with a skin graft substitute or skin graft. We will change the wound VAC on Monday and plan for further operative intervention versus wound VAC change at that time.

## 2018-10-29 NOTE — Progress Notes (Signed)
Subjective:  Patient ID: [name], male    DOB: 1981-09-02,  MRN: [id number]  Seen this AM. Doing ok. Complains of sore throat. Denies new issues. Objective:   Vitals:   10/28/18 2050 10/29/18 0429  BP: 139/69 (!) 147/104  Pulse: 86 79  Resp: 16 18  Temp: 98.4 F (36.9 C) 98.4 F (36.9 C)  SpO2: 100% 100%   General AA&O x3. Normal mood and affect.  Vascular Remaining toes appear warm.  Neurologic Epicritic sensation absent.  Dermatologic Wound VAC on and functioning. ~50 ccs ss drainage in cannister.  Orthopedic: Motor intact.   Results for orders placed or performed during the hospital encounter of 10/26/18 (from the past 24 hour(s))  Glucose, capillary     Status: None   Collection Time: 10/28/18 12:56 PM  Result Value Ref Range   Glucose-Capillary 97 70 - 99 mg/dL  Aerobic/Anaerobic Culture (surgical/deep wound)     Status: None (Preliminary result)   Collection Time: 10/28/18  3:05 PM  Result Value Ref Range   Specimen Description TISSUE TOE LEFT    Special Requests NONE    Gram Stain      NO WBC SEEN NO ORGANISMS SEEN Performed at [facility], [place] [address] [place], [place] [address]    Culture PENDING    Report Status PENDING   Glucose, capillary     Status: Abnormal   Collection Time: 10/28/18  4:34 PM  Result Value Ref Range   Glucose-Capillary 105 (H) 70 - 99 mg/dL  Glucose, capillary     Status: Abnormal   Collection Time: 10/28/18  7:57 PM  Result Value Ref Range   Glucose-Capillary 299 (H) 70 - 99 mg/dL  Glucose, capillary     Status: Abnormal   Collection Time: 10/29/18  7:22 AM  Result Value Ref Range   Glucose-Capillary 50 (L) 70 - 99 mg/dL   Comment 1 Notify RN    Comment 2 Repeat Test    Comment 3 Document in Chart   Glucose, capillary     Status: Abnormal   Collection Time: 10/29/18  7:27 AM  Result Value Ref Range   Glucose-Capillary 48 (L) 70 - 99 mg/dL    Results for orders placed or performed during the hospital  encounter of 10/26/18  Blood culture (routine x 2)     Status: None (Preliminary result)   Collection Time: 10/26/18  5:20 PM  Result Value Ref Range Status   Specimen Description BLOOD RIGHT ANTECUBITAL  Final   Special Requests   Final    BOTTLES DRAWN AEROBIC AND ANAEROBIC Blood Culture adequate volume Performed at [facility], [place] [address], [place], [place] [address]    Culture NO GROWTH 3 DAYS  Final   Report Status PENDING  Incomplete  Blood culture (routine x 2)     Status: None (Preliminary result)   Collection Time: 10/26/18  5:25 PM  Result Value Ref Range Status   Specimen Description BLOOD RIGHT FOREARM  Final   Special Requests   Final    BOTTLES DRAWN AEROBIC AND ANAEROBIC Blood Culture adequate volume Performed at [facility], [place] [address] [place], [place] [address]    Culture NO GROWTH 3 DAYS  Final   Report Status PENDING  Incomplete  MRSA PCR Screening     Status: None   Collection Time: 10/27/18  4:11 AM  Result Value Ref Range Status   MRSA by PCR NEGATIVE NEGATIVE Final  Comment:        The GeneXpert MRSA Assay (FDA approved for NASAL specimens only), is one component of a comprehensive MRSA colonization surveillance program. It is not intended to diagnose MRSA infection nor to guide or monitor treatment for MRSA infections. Performed at [facility], [place] [address], [place], [place] [address]   Aerobic/Anaerobic Culture (surgical/deep wound)     Status: None (Preliminary result)   Collection Time: 10/28/18  3:05 PM  Result Value Ref Range Status   Specimen Description TISSUE TOE LEFT  Final   Special Requests NONE  Final   Gram Stain   Final    NO WBC SEEN NO ORGANISMS SEEN Performed at [facility], [address], [place], [place] [address]    Culture PENDING  Incomplete   Report Status PENDING  Incomplete    Assessment & Plan:  Patient was evaluated and treated and all questions answered.  Gangrene L Toe,  Wound Left foot s/p amputation and debridement, application of VAC - Wound VAC on and functioning. To be changed by [facility] Monday. No other provider to change VAC. - Micro pending. Awaiting cultures to tailor abx. - Path pending - Continue abx - Clinical plan pending appearance of wound on Monday. Will provide further reccs at that time.   Will continue to follow.

## 2018-10-29 NOTE — Progress Notes (Signed)
[facility] Progress Note   Subjective:   Seen in room. No CP/dyspnea. S/p L great toe amputation on 1/31 - wound vac in place.  Objective Vitals:   10/28/18 1648 10/28/18 2050 10/29/18 0429 10/29/18 1004  BP: (!) 160/94 139/69 (!) 147/104 (!) 144/76  Pulse: 74 86 79 78  Resp:  16 18 20   Temp:  98.4 F (36.9 C) 98.4 F (36.9 C) 97.6 F (36.4 C)  TempSrc:  Oral Oral Oral  SpO2: 100% 100% 100% 100%  Weight:      Height:       Physical Exam General: Well appearing man, NAD Heart: RRR; no murmur Lungs: CTAB Abdomen: soft, non-tender Extremities: L foot bandaged with wound vac, no RLE edema Dialysis Access: L AVG + bruit  Additional Objective Labs: Basic Metabolic Panel: Recent Labs  Lab 10/24/18 0826 10/26/18 1716 10/27/18 0426 10/28/18 0800  NA 131* 132* 132* 132*  K 3.5 3.8 4.1 4.8  CL 95* 91* 91* 94*  CO2 21* 26 29 26   GLUCOSE 296* 203* 405* 211*  BUN 41* 13 20 33*  CREATININE 9.39* 5.83* 7.01* 9.01*  CALCIUM 8.4* 9.4 9.1 9.2  PHOS 3.7  --   --  5.9*   Liver Function Tests: Recent Labs  Lab 10/24/18 0826 10/26/18 1716 10/28/18 0800  AST  --  23  --   ALT  --  5  --   ALKPHOS  --  112  --   BILITOT  --  0.7  --   PROT  --  7.3  --   ALBUMIN 2.2* 2.5* 2.1*   CBC: Recent Labs  Lab 10/22/18 1233 10/24/18 0826 10/26/18 1716 10/27/18 0426  WBC 17.8* 3.9* 13.4* 9.6  NEUTROABS  --   --  11.0*  --   HGB 11.4* 11.2* 10.0* 9.4*  HCT 36.6* 36.2* 33.3* 30.8*  MCV 93.4 93.5 96.8 96.6  PLT 191 153 182 212   Blood Culture    Component Value Date/Time   SDES TISSUE TOE LEFT 10/28/2018 1505   SPECREQUEST NONE 10/28/2018 1505   CULT PENDING 10/28/2018 1505   REPTSTATUS PENDING 10/28/2018 1505   Studies/Results: Ct Foot Left W Contrast  Result Date: 10/27/2018 CLINICAL DATA:  Soft tissue swelling, cellulitis and skin lesion. Osteomyelitis is suspected. EXAM: CT OF THE LOWER LEFT EXTREMITY WITH CONTRAST TECHNIQUE: Multidetector CT  imaging of the lower left extremity was performed according to the standard protocol following intravenous contrast administration. COMPARISON:  Plain radiographs dating back through 08/11/2018 of the foot and MRI from 08/18/2018. CONTRAST:  177mL OMNIPAQUE IOHEXOL 300 MG/ML  SOLN FINDINGS: Bones/Joint/Cartilage The patient is status post amputation of the fifth ray sparing the base of the fifth metatarsal. The surgical margin appears undulating and slightly irregular and the possibility of osteomyelitis is not excluded involving the amputation margin of the fifth metatarsal base. The distal tibia and fibula, tibiotalar, subtalar and midfoot articulations are maintained. No bone destruction or fracture of the forefoot is identified despite soft tissue changes described below. Ligaments Suboptimally assessed by CT. Muscles and Tendons The tendons crossing the ankle and forefoot are intact without evidence of tenosynovitis. No pyomyositis. Generalized diffuse disuse atrophy of the musculature of the foot. Soft tissues Soft tissue wound over the dorsum of the forefoot between the great and second toes with soft tissue emphysema identified over the dorsum of the forefoot. No focal fluid collection to suggest abscess. No CT evidence of acute osteomyelitis of the adjacent forefoot deep to  the soft tissue. IMPRESSION: 1. The patient is status post amputation of the fifth ray sparing the base of the fifth metatarsal. The surgical margin appears undulating and slightly irregular and the possibility of osteomyelitis is not excluded at the surgical margin. 2. Soft tissue wound over the dorsum of the forefoot between the great and second toes with soft tissue emphysema over the dorsum of the forefoot. No focal fluid collection to suggest abscess. No adjacent osteomyelitis is identified. Electronically Signed   By: Ashley Royalty M.D.   On: 10/27/2018 21:24   Dg Foot 2 Views Left  Result Date: 10/28/2018 CLINICAL DATA:  Status  post amputation of the great toe. Wound VAC in place. EXAM: LEFT FOOT - 2 VIEW COMPARISON:  Radiographs dated 10/26/2018 FINDINGS: The patient has undergone interval resection of the great toe. Wound VAC in place. Prior amputation of fifth digit and of the majority of the fifth metatarsal with a remnant of the base. There is soft tissue swelling adjacent to the first metatarsal. No gas in the soft tissues. Chronic arterial calcifications in the left lower leg and foot. IMPRESSION: Satisfactory appearance of the left foot after amputation of the great toe. Electronically Signed   By: Lorriane Shire M.D.   On: 10/28/2018 17:58   Medications: . sodium chloride    . sodium chloride 10 mL/hr at 10/28/18 1408  . ampicillin-sulbactam (UNASYN) IV 1.5 g (10/28/18 2145)  . ferric gluconate (FERRLECIT/NULECIT) IV 62.5 mg (10/28/18 1128)   . calcitRIOL  2.5 mcg Oral Q M,W,F-HD  . Chlorhexidine Gluconate Cloth  6 each Topical Q0600  . cinacalcet  30 mg Oral Q supper  . darbepoetin (ARANESP) injection - DIALYSIS  100 mcg Intravenous Q Fri-HD  . insulin aspart  0-5 Units Subcutaneous QHS  . insulin aspart  0-9 Units Subcutaneous TID WC  . insulin glargine  10 Units Subcutaneous BID  . multivitamin  1 tablet Oral QHS  . sevelamer carbonate  2,400 mg Oral TID WC  . sodium chloride flush  3 mL Intravenous Q12H    Dialysis Orders: [facility] MWF 4 hr EDW 75.5 kg heparin 8000 2 K 2 Ca left upper AVGG calcitriol 2.5 venofer 100 x 7 on hold due to recent infection.  hgb at d/c 11.2 no ESA at present - last Mircera was 225 12/10 and prior to that 75  Assessment/Plan: 1. Left foot osteo s/p two I&D: S/p L great toe amputation 1/31- wound vac in place. Per podiatry. On Vanc/Unasyn. 2. ESRD: Continue HD per MWF schedule - next 2/3. 3. Anemia: Hgb 9.4 - continue weekly Aranesp. 4. Secondary hyperparathyroidism: Ca/Phos decent. Continue sevelamer, sensipar, calcitriol 5. HTN/volume: BP ok, keeping same EDW for now. 6.  Nutrition: Alb low, continue ^ protein diet. 7. DM: Per primary.  Veneta Penton, PA-C 10/29/2018, 10:42 AM  [facility] Pager: [phone]

## 2018-10-30 LAB — GLUCOSE, CAPILLARY
Glucose-Capillary: 166 mg/dL — ABNORMAL HIGH (ref 70–99)
Glucose-Capillary: 122 mg/dL — ABNORMAL HIGH (ref 70–99)
Glucose-Capillary: 125 mg/dL — ABNORMAL HIGH (ref 70–99)
Glucose-Capillary: 86 mg/dL (ref 70–99)
Glucose-Capillary: 48 mg/dL — ABNORMAL LOW (ref 70–99)
Glucose-Capillary: 64 mg/dL — ABNORMAL LOW (ref 70–99)
Glucose-Capillary: 146 mg/dL — ABNORMAL HIGH (ref 70–99)
Glucose-Capillary: 125 mg/dL — ABNORMAL HIGH (ref 70–99)

## 2018-10-30 MED ORDER — DEXTROSE 50 % IV SOLN
INTRAVENOUS | Status: AC
  Administered 2018-10-30: 50 mL
  Filled 2018-10-30: qty 50

## 2018-10-30 NOTE — Progress Notes (Signed)
[facility] PROGRESS NOTE    Progress Note  [name]  WVP:[id number] DOB: December 12, 1980 DOA: 10/26/2018 PCP: Patient, No Pcp Per     Brief Narrative:   [name] is an 38 y.o. male past medical history history significant for end-stage renal disease with dialysis Monday Wednesday Friday last dialysis was on the day of admission chronic bilateral wound infections diabetes mellitus, recently discharged on the day prior to admission with a wound VAC and oral antibiotics, the wound VAC nurse relates the wound VAC was not on on appropriately when she removed that some skin peeled off and it was very red.  The podiatrist was consulted who relates he would need an amputation but the patient has been reluctant to do so in the past.  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  Has remained afebrile with no leukocytosis. - Status post surgical intervention on 10/28/2018 with a left great toe amputation with I&D, wound VAC was placed.  - Further management per Dr. March Rummage podiatry. -Culture data has remained negative till date, except for  culture obtained during surgery that shows rare staph aureus sensitivities are pending.  ESRD (end stage renal disease) on dialysis [facility]) - Further management per nephrology.  Anemia of chronic disease - Further management per renal.  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   DVT prophylaxis: lovenox Family Communication:none Disposition Plan/Barrier to D/C: unable to determine Code Status:     Code Status Orders  (From admission, onward)         Start     Ordered   10/26/18 1949  Full code  Continuous     10/26/18 1949        Code Status  History    Date Active Date Inactive Code Status Order ID Comments User Context   10/20/2018 1730 10/25/2018 2038 Full Code [id number]  Modena Jansky, MD Inpatient   08/17/2018 2332 08/25/2018 1645 Full Code [id number]  Shela Leff, MD Inpatient   02/01/2018 1446 02/06/2018 0032 Full Code [id number]  Jule Ser, DO ED   10/25/2017 2225 11/03/2017 2322 Full Code [id number]  [name], MD ED   11/16/2016 1338 11/16/2016 1649 Full Code [id number]  Elam Dutch, MD Inpatient   05/27/2015 0707 05/29/2015 1809 Full Code [id number]  Harrie Foreman, MD Inpatient        IV Access:    Peripheral IV   Procedures and diagnostic studies:   Dg Foot 2 Views Left  Result Date: 10/28/2018 CLINICAL DATA:  Status post amputation of the great toe. Wound VAC in place. EXAM: LEFT FOOT - 2 VIEW COMPARISON:  Radiographs dated 10/26/2018 FINDINGS: The patient has undergone interval resection of the great toe. Wound VAC in place. Prior amputation of fifth digit and of the majority of the fifth metatarsal with a remnant of the base. There is soft tissue swelling adjacent to the first metatarsal. No gas in the soft tissues. Chronic arterial calcifications in the left lower leg and foot. IMPRESSION: Satisfactory appearance of the left foot after amputation of the great toe. Electronically Signed   By: Lorriane Shire M.D.   On: 10/28/2018 17:58     Medical Consultants:    None.  Anti-Infectives:   IV Unasyn  Subjective:    [name] he relates his pain is controlled today. Objective:    Vitals:   10/29/18 1004 10/29/18 1651 10/29/18 2116 10/30/18 0650  BP: (!) 144/76 140/77 (!) 171/75 (!) 180/67  Pulse: 78 87 86 83  Resp: 20 20 20 20   Temp: 97.6 F (36.4 C) (!) 97.5 F (36.4 C) 98.2 F (36.8 C) (!) 97.4 F (36.3 C)  TempSrc: Oral Oral Oral Oral  SpO2: 100% 100% 100% 100%  Weight:   76.3 kg   Height:        Intake/Output Summary (Last 24 hours) at 10/30/2018 0921 Last  data filed at 10/30/2018 0600 Gross per 24 hour  Intake 2320 ml  Output 0 ml  Net 2320 ml   Filed Weights   10/28/18 0720 10/28/18 1235 10/29/18 2116  Weight: 82.4 kg 75.3 kg 76.3 kg    Exam: General exam: In no acute distress. Respiratory system: Good air movement and clear to auscultation. Cardiovascular system: S1 & S2 heard, RRR.  Gastrointestinal system: Abdomen is nondistended, soft and nontender.  Central nervous system: Alert and oriented. No focal neurological deficits. Extremities: No pedal edema. Skin: Dressing with wound VAC on the left foot Psychiatry: Patient has poor insight on medical condition.   Data Reviewed:    Labs: Basic Metabolic Panel: Recent Labs  Lab 10/24/18 0826 10/26/18 1716 10/27/18 0426 10/28/18 0800  NA 131* 132* 132* 132*  K 3.5 3.8 4.1 4.8  CL 95* 91* 91* 94*  CO2 21* 26 29 26   GLUCOSE 296* 203* 405* 211*  BUN 41* 13 20 33*  CREATININE 9.39* 5.83* 7.01* 9.01*  CALCIUM 8.4* 9.4 9.1 9.2  PHOS 3.7  --   --  5.9*   GFR Estimated Creatinine Clearance: 9.9 mL/min (A) (by C-G formula based on SCr of 9.01 mg/dL (H)). Liver Function Tests: Recent Labs  Lab 10/24/18 0826 10/26/18 1716 10/28/18 0800  AST  --  23  --   ALT  --  5  --   ALKPHOS  --  112  --   BILITOT  --  0.7  --   PROT  --  7.3  --   ALBUMIN 2.2* 2.5* 2.1*   No results for input(s): LIPASE, AMYLASE in the last 168 hours. No results for input(s): AMMONIA in the last 168 hours. Coagulation profile No results for input(s): INR, PROTIME in the last 168 hours.  CBC: Recent Labs  Lab 10/24/18 0826 10/26/18 1716 10/27/18 0426  WBC 3.9* 13.4* 9.6  NEUTROABS  --  11.0*  --   HGB 11.2* 10.0* 9.4*  HCT 36.2* 33.3* 30.8*  MCV 93.5 96.8 96.6  PLT 153 182 212   Cardiac Enzymes: No results for input(s): CKTOTAL, CKMB, CKMBINDEX, TROPONINI in the last 168 hours. BNP (last 3 results) No results for input(s): PROBNP in the last 8760 hours. CBG: Recent Labs  Lab  10/29/18 2119 10/29/18 2331 10/30/18 0213 10/30/18 0701 10/30/18 0718  GLUCAP 66* 127* 166* 125* 122*   D-Dimer: No results for input(s): DDIMER in the last 72 hours. Hgb A1c: No results for input(s): HGBA1C in the last 72 hours. Lipid Profile: No results for input(s): CHOL, HDL, LDLCALC, TRIG, CHOLHDL, LDLDIRECT in the last 72 hours. Thyroid function studies: No results for input(s): TSH, T4TOTAL, T3FREE, THYROIDAB in the last 72 hours.  Invalid input(s): FREET3 Anemia work up: No results for input(s): VITAMINB12, FOLATE, FERRITIN, TIBC, IRON, RETICCTPCT in the last 72 hours. Sepsis Labs: Recent Labs  Lab 10/24/18 0826 10/26/18 1716 10/26/18 1822 10/27/18 0426  WBC 3.9* 13.4*  --  9.6  LATICACIDVEN  --  2.6* 2.0*  --    Microbiology Recent Results (from the past 240 hour(s))  Blood Culture (routine x 2)     Status: None   Collection Time: 10/20/18 12:13 PM  Result Value Ref Range Status   Specimen Description BLOOD RIGHT ANTECUBITAL  Final   Special Requests   Final    BOTTLES DRAWN AEROBIC AND ANAEROBIC Blood Culture adequate volume   Culture   Final    NO GROWTH 5 DAYS Performed at [facility], [address], [place], [place] [address]    Report Status 10/25/2018 FINAL  Final  Gastrointestinal Panel by PCR , Stool     Status: None   Collection Time: 10/20/18 12:26 PM  Result Value Ref Range Status   Campylobacter species NOT DETECTED NOT DETECTED Final   Plesimonas shigelloides NOT DETECTED NOT DETECTED Final   Salmonella species NOT DETECTED NOT DETECTED Final   Yersinia enterocolitica NOT DETECTED NOT DETECTED Final   Vibrio species NOT DETECTED NOT DETECTED Final   Vibrio cholerae NOT DETECTED NOT DETECTED Final   Enteroaggregative E coli (EAEC) NOT DETECTED NOT DETECTED Final   Enteropathogenic E coli (EPEC) NOT DETECTED NOT DETECTED Final   Enterotoxigenic E coli (ETEC) NOT DETECTED NOT DETECTED Final   Shiga like toxin producing E coli (STEC)  NOT DETECTED NOT DETECTED Final   Shigella/Enteroinvasive E coli (EIEC) NOT DETECTED NOT DETECTED Final   Cryptosporidium NOT DETECTED NOT DETECTED Final   Cyclospora cayetanensis NOT DETECTED NOT DETECTED Final   Entamoeba histolytica NOT DETECTED NOT DETECTED Final   Giardia lamblia NOT DETECTED NOT DETECTED Final   Adenovirus F40/41 NOT DETECTED NOT DETECTED Final   Astrovirus NOT DETECTED NOT DETECTED Final   Norovirus GI/GII NOT DETECTED NOT DETECTED Final   Rotavirus A NOT DETECTED NOT DETECTED Final   Sapovirus (I, II, IV, and V) NOT DETECTED NOT DETECTED Final    Comment: Performed at [facility], [place] [place], [place] [address]  C difficile quick scan w PCR reflex     Status: None   Collection Time: 10/20/18 12:26 PM  Result Value Ref Range Status   C Diff antigen NEGATIVE NEGATIVE Final   C Diff toxin NEGATIVE NEGATIVE Final   C Diff interpretation No C. difficile detected.  Final    Comment: Performed at [facility], [place] [address] [place], [place] [address]  Blood Culture (routine x 2)     Status: None   Collection Time: 10/20/18  1:37 PM  Result Value Ref Range Status   Specimen Description BLOOD RIGHT HAND  Final   Special Requests   Final    BOTTLES DRAWN AEROBIC AND ANAEROBIC Blood Culture adequate volume   Culture   Final    NO GROWTH 5 DAYS Performed at [facility], [place] [address], [place],  [address]    Report Status 10/25/2018 FINAL  Final  Aerobic/Anaerobic Culture (surgical/deep wound)     Status: None   Collection Time: 10/20/18  8:37 PM  Result Value Ref Range Status   Specimen Description ABSCESS LEFT FOOT  Final   Special Requests NONE  Final   Gram Stain   Final    FEW WBC PRESENT, PREDOMINANTLY MONONUCLEAR RARE GRAM POSITIVE COCCI    Culture   Final    MODERATE STAPHYLOCOCCUS AUREUS NO ANAEROBES ISOLATED Performed  at [facility], [place] [address], [place], [place] [address]    Report Status  10/25/2018 FINAL  Final   Organism ID, Bacteria STAPHYLOCOCCUS AUREUS  Final      Susceptibility   Staphylococcus aureus - MIC*    CIPROFLOXACIN <=0.5 SENSITIVE Sensitive     ERYTHROMYCIN <=0.25 SENSITIVE Sensitive     GENTAMICIN <=0.5 SENSITIVE Sensitive     OXACILLIN <=0.25 SENSITIVE Sensitive     TETRACYCLINE <=1 SENSITIVE Sensitive     VANCOMYCIN <=0.5 SENSITIVE Sensitive     TRIMETH/SULFA <=10 SENSITIVE Sensitive     CLINDAMYCIN <=0.25 SENSITIVE Sensitive     RIFAMPIN <=0.5 SENSITIVE Sensitive     Inducible Clindamycin NEGATIVE Sensitive     * MODERATE STAPHYLOCOCCUS AUREUS  Blood culture (routine x 2)     Status: None (Preliminary result)   Collection Time: 10/26/18  5:20 PM  Result Value Ref Range Status   Specimen Description BLOOD RIGHT ANTECUBITAL  Final   Special Requests   Final    BOTTLES DRAWN AEROBIC AND ANAEROBIC Blood Culture adequate volume Performed at [place] [address], [place], [place] [address]    Culture NO GROWTH 4 DAYS  Final   Report Status PENDING  Incomplete  Blood culture (routine x 2)     Status: None (Preliminary result)   Collection Time: 10/26/18  5:25 PM  Result Value Ref Range Status   Specimen Description BLOOD RIGHT FOREARM  Final   Special Requests   Final    BOTTLES DRAWN AEROBIC AND ANAEROBIC Blood Culture adequate volume Performed at [facility], [place] [address] [place], [place] [address]    Culture NO GROWTH 4 DAYS  Final   Report Status PENDING  Incomplete  MRSA PCR Screening     Status: None   Collection Time: 10/27/18  4:11 AM  Result Value Ref Range Status   MRSA by PCR NEGATIVE NEGATIVE Final    Comment:        The GeneXpert MRSA Assay (FDA approved for NASAL specimens only), is one component of a comprehensive MRSA colonization surveillance program. It is not intended to diagnose MRSA infection nor to guide or monitor treatment for MRSA infections. Performed at [facility], [place] [address], [place], [place] [address]   Aerobic/Anaerobic Culture (surgical/deep wound)     Status: None (Preliminary result)   Collection Time: 10/28/18  3:05 PM  Result Value Ref Range Status   Specimen Description TISSUE TOE LEFT  Final   Special Requests NONE  Final   Gram Stain   Final    NO WBC SEEN NO ORGANISMS SEEN Performed at [facility], [address] [place], [place] [address]    Culture RARE STAPHYLOCOCCUS AUREUS  Final   Report Status PENDING  Incomplete     Medications:   . calcitRIOL  2.5 mcg Oral Q M,W,F-HD  . Chlorhexidine Gluconate Cloth  6 each Topical Q0600  . cinacalcet  30 mg Oral Q supper  . darbepoetin (ARANESP) injection - DIALYSIS  100 mcg Intravenous Q Fri-HD  . insulin aspart  0-5 Units Subcutaneous QHS  . insulin aspart  0-9 Units Subcutaneous TID WC  . insulin glargine  10 Units Subcutaneous BID  .  morphine injection  1 mg Intramuscular Once  . multivitamin  1 tablet Oral QHS  . sevelamer carbonate  2,400 mg Oral TID WC  . sodium chloride flush  3 mL Intravenous Q12H  Continuous Infusions: . sodium chloride    . sodium chloride 10 mL/hr at 10/28/18 1408  . ampicillin-sulbactam (UNASYN) IV 1.5 g (10/29/18 2338)  . ferric gluconate (FERRLECIT/NULECIT) IV 62.5 mg (10/28/18 1128)      LOS: 4 days   Charlynne Cousins  [facility]  10/30/2018, 9:21 AM

## 2018-10-30 NOTE — Progress Notes (Signed)
[facility] Progress Note   Subjective: Seen in room. No new complaints today. No CP/dyspnea. L foot pain manageable, wound vac remains in place. Excited to watch the Superbowl later.    Objective Vitals:   10/29/18 1651 10/29/18 2116 10/30/18 0650 10/30/18 0931  BP: 140/77 (!) 171/75 (!) 180/67 (!) 202/105  Pulse: 87 86 83 85  Resp: 20 20 20 20   Temp: (!) 97.5 F (36.4 C) 98.2 F (36.8 C) (!) 97.4 F (36.3 C) 97.7 F (36.5 C)  TempSrc: Oral Oral Oral Oral  SpO2: 100% 100% 100% 98%  Weight:  76.3 kg    Height:       Physical Exam General: Well appearing man, NAD Heart: RRR; no murmur Lungs: CTAB Abdomen: soft, non-tender Extremities: L foot bandaged with wound vac, no RLE edema Dialysis Access: L AVG + bruit  Additional Objective Labs: Basic Metabolic Panel: Recent Labs  Lab 10/24/18 0826 10/26/18 1716 10/27/18 0426 10/28/18 0800  NA 131* 132* 132* 132*  K 3.5 3.8 4.1 4.8  CL 95* 91* 91* 94*  CO2 21* 26 29 26   GLUCOSE 296* 203* 405* 211*  BUN 41* 13 20 33*  CREATININE 9.39* 5.83* 7.01* 9.01*  CALCIUM 8.4* 9.4 9.1 9.2  PHOS 3.7  --   --  5.9*   Liver Function Tests: Recent Labs  Lab 10/24/18 0826 10/26/18 1716 10/28/18 0800  AST  --  23  --   ALT  --  5  --   ALKPHOS  --  112  --   BILITOT  --  0.7  --   PROT  --  7.3  --   ALBUMIN 2.2* 2.5* 2.1*   CBC: Recent Labs  Lab 10/24/18 0826 10/26/18 1716 10/27/18 0426  WBC 3.9* 13.4* 9.6  NEUTROABS  --  11.0*  --   HGB 11.2* 10.0* 9.4*  HCT 36.2* 33.3* 30.8*  MCV 93.5 96.8 96.6  PLT 153 182 212   Blood Culture    Component Value Date/Time   SDES TISSUE TOE LEFT 10/28/2018 1505   SPECREQUEST NONE 10/28/2018 1505   CULT RARE STAPHYLOCOCCUS AUREUS 10/28/2018 1505   REPTSTATUS PENDING 10/28/2018 1505   Studies/Results: Dg Foot 2 Views Left  Result Date: 10/28/2018 CLINICAL DATA:  Status post amputation of the great toe. Wound VAC in place. EXAM: LEFT FOOT - 2 VIEW COMPARISON:   Radiographs dated 10/26/2018 FINDINGS: The patient has undergone interval resection of the great toe. Wound VAC in place. Prior amputation of fifth digit and of the majority of the fifth metatarsal with a remnant of the base. There is soft tissue swelling adjacent to the first metatarsal. No gas in the soft tissues. Chronic arterial calcifications in the left lower leg and foot. IMPRESSION: Satisfactory appearance of the left foot after amputation of the great toe. Electronically Signed   By: Lorriane Shire M.D.   On: 10/28/2018 17:58   Medications: . sodium chloride    . sodium chloride 10 mL/hr at 10/28/18 1408  . ampicillin-sulbactam (UNASYN) IV 1.5 g (10/29/18 2338)  . ferric gluconate (FERRLECIT/NULECIT) IV 62.5 mg (10/28/18 1128)   . calcitRIOL  2.5 mcg Oral Q M,W,F-HD  . Chlorhexidine Gluconate Cloth  6 each Topical Q0600  . cinacalcet  30 mg Oral Q supper  . darbepoetin (ARANESP) injection - DIALYSIS  100 mcg Intravenous Q Fri-HD  . insulin aspart  0-5 Units Subcutaneous QHS  . insulin aspart  0-9 Units Subcutaneous TID WC  . insulin glargine  10 Units Subcutaneous BID  .  morphine injection  1 mg Intramuscular Once  . multivitamin  1 tablet Oral QHS  . sevelamer carbonate  2,400 mg Oral TID WC  . sodium chloride flush  3 mL Intravenous Q12H    Dialysis Orders: [facility] MWF 4 hr EDW 75.5 kg heparin 8000 2 K 2 Ca left upper AVGG calcitriol 2.5 venofer 100 x 7 on hold due to recent infection. hgb at d/c 11.2 no ESA at present - last Mircera was 225 12/10 and prior to that 75  Assessment/Plan: 1.Left foot osteo s/p two I&D: S/p L great toe amputation 1/31- wound vac in place. Per podiatry. On Vanc/Unasyn. Prelim wound Cx + staph aureus. 2. ESRD: Continue HD per MWF schedule - next 2/3. 3. Anemia: Hgb 9.4 - continue weekly Aranesp. 4. Secondary hyperparathyroidism: Ca/Phos decent. Continue sevelamer, sensipar, calcitriol 5.HTN/volume: BP ok, keeping same EDW for now. 6. Nutrition:  Alb low, continue ^ protein diet. 7. DM: Per primary.  Veneta Penton, PA-C 10/30/2018, 10:01 AM  [facility] Pager: [phone]

## 2018-10-31 ENCOUNTER — Encounter (HOSPITAL_COMMUNITY): Payer: Self-pay | Admitting: Podiatry

## 2018-10-31 LAB — RENAL FUNCTION PANEL
Albumin: 2.4 g/dL — ABNORMAL LOW (ref 3.5–5.0)
Anion gap: 15 (ref 5–15)
BUN: 38 mg/dL — ABNORMAL HIGH (ref 6–20)
CO2: 25 mmol/L (ref 22–32)
Calcium: 8.9 mg/dL (ref 8.9–10.3)
Chloride: 95 mmol/L — ABNORMAL LOW (ref 98–111)
Creatinine, Ser: 9.42 mg/dL — ABNORMAL HIGH (ref 0.61–1.24)
GFR calc Af Amer: 7 mL/min — ABNORMAL LOW (ref >60)
GFR calc non Af Amer: 6 mL/min — ABNORMAL LOW (ref >60)
Glucose, Bld: 96 mg/dL (ref 70–99)
Phosphorus: 7.8 mg/dL — ABNORMAL HIGH (ref 2.5–4.6)
Potassium: 4.8 mmol/L (ref 3.5–5.1)
Sodium: 135 mmol/L (ref 135–145)

## 2018-10-31 LAB — GLUCOSE, CAPILLARY
Glucose-Capillary: 79 mg/dL (ref 70–99)
Glucose-Capillary: 75 mg/dL (ref 70–99)
Glucose-Capillary: 130 mg/dL — ABNORMAL HIGH (ref 70–99)
Glucose-Capillary: 47 mg/dL — ABNORMAL LOW (ref 70–99)
Glucose-Capillary: 88 mg/dL (ref 70–99)
Glucose-Capillary: 120 mg/dL — ABNORMAL HIGH (ref 70–99)
Glucose-Capillary: 117 mg/dL — ABNORMAL HIGH (ref 70–99)
Glucose-Capillary: 126 mg/dL — ABNORMAL HIGH (ref 70–99)
Glucose-Capillary: 170 mg/dL — ABNORMAL HIGH (ref 70–99)

## 2018-10-31 LAB — CBC
HCT: 30.8 % — ABNORMAL LOW (ref 39.0–52.0)
Hemoglobin: 9.2 g/dL — ABNORMAL LOW (ref 13.0–17.0)
MCH: 29.3 pg (ref 26.0–34.0)
MCHC: 29.9 g/dL — ABNORMAL LOW (ref 30.0–36.0)
MCV: 98.1 fL (ref 80.0–100.0)
Platelets: 295 10*3/uL (ref 150–400)
RBC: 3.14 MIL/uL — ABNORMAL LOW (ref 4.22–5.81)
RDW: 14.9 % (ref 11.5–15.5)
WBC: 10.9 10*3/uL — ABNORMAL HIGH (ref 4.0–10.5)
nRBC: 0 % (ref 0.0–0.2)

## 2018-10-31 LAB — CULTURE, BLOOD (ROUTINE X 2)
Culture: NO GROWTH
Culture: NO GROWTH
Special Requests: ADEQUATE
Special Requests: ADEQUATE

## 2018-10-31 MED ORDER — INSULIN GLARGINE 100 UNIT/ML ~~LOC~~ SOLN
5.0000 [IU] | Freq: Two times a day (BID) | SUBCUTANEOUS | Status: DC
  Administered 2018-10-31 – 2018-11-03 (×5): 5 [IU] via SUBCUTANEOUS
  Filled 2018-10-31 (×7): qty 0.05

## 2018-10-31 MED ORDER — INSULIN GLARGINE 100 UNIT/ML ~~LOC~~ SOLN
7.0000 [IU] | Freq: Two times a day (BID) | SUBCUTANEOUS | Status: DC
  Administered 2018-10-31: 7 [IU] via SUBCUTANEOUS
  Filled 2018-10-31 (×2): qty 0.07

## 2018-10-31 MED ORDER — CALCITRIOL 0.5 MCG PO CAPS
ORAL_CAPSULE | ORAL | Status: AC
  Filled 2018-10-31: qty 2

## 2018-10-31 MED ORDER — DEXTROSE 50 % IV SOLN
INTRAVENOUS | Status: AC
  Administered 2018-10-31: 07:00:00
  Filled 2018-10-31: qty 50

## 2018-10-31 NOTE — Progress Notes (Signed)
At  the end of treatment, Patient was upset that UF goal was not set to remove fluid to his dry weight.  He was informed by this nurse that the order was for 3L to be removed and that is what was taken off, Patient requested to speak to the doctor and PA-C [name] was available and talked to him but patient continued to be verbally load and belligerent. Patient was discharged to his room and the floor nurse was informed of his behavior.

## 2018-10-31 NOTE — Care Management Note (Addendum)
Case Management Note Manya Silvas, RN MSN CCM Transitions of Care 30M [place] [phone]  Patient Details  Name: VIGGO PERKO MRN: [id number] Date of Birth: 16-Nov-1980  Subjective/Objective:      Cellulitis toes, L foot            Action/Plan: PTA home. Independent. Hemodialysis MWF. Stated that he has wound vac from [facility] at home and that he had only been home 1 day with vac before coming back to hospital. Pt is also active with [facility] for RN to do the dressing changes. Noted vac dressing is held at this time. Pt reports at this time no other needs. CM to continue to follow for transition of care needs.   Expected Discharge Date:                  Expected Discharge Plan:  [place]  In-House Referral:  NA  Discharge planning Services  CM Consult  Post Acute Care Choice:  Resumption of Svcs/PTA Provider Choice offered to:  Patient  DME Arranged:  Vac(vac already arranged and pt home from previous admission) DME Agency:  [place]:  RN [facility] Agency:  [place]  Status of Service:  In process, will continue to follow  If discussed at Long Length of Stay Meetings, dates discussed:    Additional Comments:  Bartholomew Crews, RN 10/31/2018, 2:17 PM

## 2018-10-31 NOTE — Progress Notes (Signed)
[facility] PROGRESS NOTE    Progress Note  CARLESTER [name]  EXH:[id number] DOB: 01-31-1981 DOA: 10/26/2018 PCP: Patient, No Pcp Per     Brief Narrative:   [name] is an 38 y.o. male past medical history history significant for end-stage renal disease with dialysis Monday Wednesday Friday last dialysis was on the day of admission chronic bilateral wound infections diabetes mellitus, recently discharged on the day prior to admission with a wound VAC and oral antibiotics, the wound VAC nurse relates the wound VAC was not on on appropriately when she removed that some skin peeled off and it was very red.  The podiatrist was consulted who relates he would need an amputation but the patient has been reluctant to do so in the past.  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  Afebrile, but has a worsening leukocytosis. - Status post surgical intervention on 10/28/2018 with a left great toe amputation with I&D. -Dr. March Rummage reevaluated the patient and they recommended I&D on 11/01/2018 -Culture data has remained negative till date, except for  culture obtained during surgery that shows rare staph aureus sensitivities are pending.  ESRD (end stage renal disease) on dialysis [facility]) - Further management per nephrology.  Anemia of chronic disease - Further management per renal.  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   DVT prophylaxis: lovenox Family Communication:none Disposition Plan/Barrier to D/C: unable to determine Code Status:     Code Status Orders  (From admission, onward)         Start     Ordered   10/26/18 1949  Full code  Continuous     10/26/18 1949        Code Status History    Date Active Date Inactive Code Status Order ID Comments User Context   10/20/2018 1730 10/25/2018 2038 Full Code [id number]  Modena Jansky, MD Inpatient   08/17/2018 2332 08/25/2018 1645 Full Code [id number]  Shela Leff, MD Inpatient   02/01/2018 1446 02/06/2018 0032 Full Code [id number]  Jule Ser, DO ED   10/25/2017 2225 11/03/2017 2322 Full Code [id number]  [name], MD ED   11/16/2016 1338 11/16/2016 1649 Full Code [id number]  Elam Dutch, MD Inpatient   05/27/2015 0707 05/29/2015 1809 Full Code [id number]  Harrie Foreman, MD Inpatient        IV Access:    Peripheral IV   Procedures and diagnostic studies:   No results found.   Medical Consultants:    None.  Anti-Infectives:   IV Unasyn  Subjective:    [name] relates his pain is controlled.  Patient with a labile motion after I told him he needs an amputation he relates he does not want to hear that. Objective:    Vitals:   10/31/18 1130 10/31/18 1145 10/31/18 1233 10/31/18 1559  BP: (!) 157/126 (!) 157/126 137/73 (!) 152/48  Pulse: 83 83 88 97  Resp:  18 18 20   Temp:  98.3 F (36.8 C) 98.5 F (36.9 C) 97.9 F (36.6 C)  TempSrc:  Oral Oral Oral  SpO2:  97% 100% 100%  Weight:  81.2 kg  Height:        Intake/Output Summary (Last 24 hours) at 10/31/2018 1630 Last data filed at 10/31/2018 1300 Gross per 24 hour  Intake 1300 ml  Output 3000 ml  Net -1700 ml   Filed Weights   10/30/18 2050 10/31/18 0730 10/31/18 1145  Weight: 77 kg 84.2 kg 81.2 kg    Exam: General exam: In no acute distress. Respiratory system: Good air movement and clear to auscultation. Cardiovascular system: S1 & S2 heard, RRR.  Gastrointestinal system: Abdomen is nondistended, soft and nontender.  Central nervous system: Alert and oriented. No focal neurological deficits. Extremities: No pedal edema. Skin:  Dressing with wound VAC on the left foot Psychiatry: Patient has poor insight on medical condition.   Data Reviewed:    Labs: Basic Metabolic Panel: Recent Labs  Lab 10/26/18 1716 10/27/18 0426 10/28/18 0800 10/31/18 0756  NA 132* 132* 132* 135  K 3.8 4.1 4.8 4.8  CL 91* 91* 94* 95*  CO2 26 29 26 25   GLUCOSE 203* 405* 211* 96  BUN 13 20 33* 38*  CREATININE 5.83* 7.01* 9.01* 9.42*  CALCIUM 9.4 9.1 9.2 8.9  PHOS  --   --  5.9* 7.8*   GFR Estimated Creatinine Clearance: 10.5 mL/min (A) (by C-G formula based on SCr of 9.42 mg/dL (H)). Liver Function Tests: Recent Labs  Lab 10/26/18 1716 10/28/18 0800 10/31/18 0756  AST 23  --   --   ALT 5  --   --   ALKPHOS 112  --   --   BILITOT 0.7  --   --   PROT 7.3  --   --   ALBUMIN 2.5* 2.1* 2.4*   No results for input(s): LIPASE, AMYLASE in the last 168 hours. No results for input(s): AMMONIA in the last 168 hours. Coagulation profile No results for input(s): INR, PROTIME in the last 168 hours.  CBC: Recent Labs  Lab 10/26/18 1716 10/27/18 0426 10/31/18 0757  WBC 13.4* 9.6 10.9*  NEUTROABS 11.0*  --   --   HGB 10.0* 9.4* 9.2*  HCT 33.3* 30.8* 30.8*  MCV 96.8 96.6 98.1  PLT 182 212 295   Cardiac Enzymes: No results for input(s): CKTOTAL, CKMB, CKMBINDEX, TROPONINI in the last 168 hours. BNP (last 3 results) No results for input(s): PROBNP in the last 8760 hours. CBG: Recent Labs  Lab 10/31/18 0712 10/31/18 0800 10/31/18 1053 10/31/18 1231 10/31/18 1558  GLUCAP 130* 88 120* 117* 126*   D-Dimer: No results for input(s): DDIMER in the last 72 hours. Hgb A1c: No results for input(s): HGBA1C in the last 72 hours. Lipid Profile: No results for input(s): CHOL, HDL, LDLCALC, TRIG, CHOLHDL, LDLDIRECT in the last 72 hours. Thyroid function studies: No results for input(s): TSH, T4TOTAL, T3FREE, THYROIDAB in the last 72 hours.  Invalid input(s): FREET3 Anemia work up: No results for input(s): VITAMINB12,  FOLATE, FERRITIN, TIBC, IRON, RETICCTPCT in the last 72 hours. Sepsis Labs: Recent Labs  Lab 10/26/18 1716 10/26/18 1822 10/27/18 0426 10/31/18 0757  WBC 13.4*  --  9.6 10.9*  LATICACIDVEN 2.6* 2.0*  --   --    Microbiology Recent Results (from the past 240 hour(s))  Blood culture (routine x 2)     Status: None   Collection Time: 10/26/18  5:20 PM  Result Value Ref Range Status   Specimen Description BLOOD RIGHT ANTECUBITAL  Final   Special Requests   Final    BOTTLES DRAWN AEROBIC AND ANAEROBIC Blood Culture adequate  volume   Culture   Final    NO GROWTH 5 DAYS Performed at [facility], [place] [address] [place], [place] [address]    Report Status 10/31/2018 FINAL  Final  Blood culture (routine x 2)     Status: None   Collection Time: 10/26/18  5:25 PM  Result Value Ref Range Status   Specimen Description BLOOD RIGHT FOREARM  Final   Special Requests   Final    BOTTLES DRAWN AEROBIC AND ANAEROBIC Blood Culture adequate volume   Culture   Final    NO GROWTH 5 DAYS Performed at [facility], [place] [address], [place], [place] [address]    Report Status 10/31/2018 FINAL  Final  MRSA PCR Screening     Status: None   Collection Time: 10/27/18  4:11 AM  Result Value Ref Range Status   MRSA by PCR NEGATIVE NEGATIVE Final    Comment:        The GeneXpert MRSA Assay (FDA approved for NASAL specimens only), is one component of a comprehensive MRSA colonization surveillance program. It is not intended to diagnose MRSA infection nor to guide or monitor treatment for MRSA infections. Performed at [facility], [place] [address] [place], [place] [address]   Aerobic/Anaerobic Culture (surgical/deep wound)     Status: None (Preliminary result)   Collection Time: 10/28/18  3:05 PM  Result Value Ref Range Status   Specimen Description TISSUE TOE LEFT  Final   Special Requests NONE  Final   Gram Stain   Final    NO WBC SEEN NO ORGANISMS SEEN Performed at [facility], [address], [place],  [address]    Culture   Final    RARE STAPHYLOCOCCUS AUREUS NO ANAEROBES ISOLATED; CULTURE IN PROGRESS FOR 5 DAYS    Report Status PENDING  Incomplete   Organism ID, Bacteria STAPHYLOCOCCUS AUREUS  Final      Susceptibility   Staphylococcus aureus - MIC*    CIPROFLOXACIN <=0.5 SENSITIVE Sensitive     ERYTHROMYCIN <=0.25 SENSITIVE Sensitive     GENTAMICIN <=0.5 SENSITIVE Sensitive     OXACILLIN <=0.25 SENSITIVE Sensitive     TETRACYCLINE <=1 SENSITIVE Sensitive     VANCOMYCIN <=0.5 SENSITIVE Sensitive     TRIMETH/SULFA <=10 SENSITIVE Sensitive     CLINDAMYCIN <=0.25 SENSITIVE Sensitive     RIFAMPIN <=0.5 SENSITIVE Sensitive     Inducible Clindamycin NEGATIVE Sensitive     * RARE STAPHYLOCOCCUS AUREUS     Medications:   . calcitRIOL  2.5 mcg Oral Q M,W,F-HD  . cinacalcet  30 mg Oral Q supper  . darbepoetin (ARANESP) injection - DIALYSIS  100 mcg Intravenous Q Fri-HD  . insulin aspart  0-5 Units Subcutaneous QHS  . insulin aspart  0-9 Units Subcutaneous TID WC  . insulin glargine  7 Units Subcutaneous BID  . multivitamin  1 tablet Oral QHS  . sevelamer carbonate  2,400 mg Oral TID WC  . sodium chloride flush  3 mL Intravenous Q12H   Continuous Infusions: . sodium chloride    . sodium chloride 10 mL/hr at 10/28/18 1408  . ampicillin-sulbactam (UNASYN) IV 1.5 g (10/31/18 1427)  . ferric gluconate (FERRLECIT/NULECIT) IV 62.5 mg (10/28/18 1128)      LOS: 5 days   Charlynne Cousins  [facility]  10/31/2018, 4:30 PM

## 2018-10-31 NOTE — Progress Notes (Signed)
Subjective:  Patient ID: [name], male    DOB: 1981/05/25,  MRN: [id number]  Seen this AM. Patient having hypoglycemic episode, acting childlike but recognizes provider.  Per RN sugar was 47 and he received D5W. Objective:   Vitals:   10/31/18 0638 10/31/18 0830  BP: (!) 186/70 (!) 164/103  Pulse: 90 (!) 110  Resp: 18 18  Temp: 98.3 F (36.8 C) 98.2 F (36.8 C)  SpO2: 100% 96%   General AA&O x3. Normal mood and affect.  Vascular Remaining toes appear warm.  Neurologic Epicritic sensation absent.  Dermatologic Wound VAC on and functioning. ~100 ccs ss drainage in cannister. Upon removal wound macerated but appears to be healing with fibro-granular base. Minimal necrosis. Bone not completely covered  Orthopedic: Motor intact.   Results for orders placed or performed during the hospital encounter of 10/26/18 (from the past 24 hour(s))  Glucose, capillary     Status: Abnormal   Collection Time: 10/30/18 12:20 PM  Result Value Ref Range   Glucose-Capillary 48 (L) 70 - 99 mg/dL  Glucose, capillary     Status: Abnormal   Collection Time: 10/30/18 12:40 PM  Result Value Ref Range   Glucose-Capillary 146 (H) 70 - 99 mg/dL  Glucose, capillary     Status: Abnormal   Collection Time: 10/30/18  4:44 PM  Result Value Ref Range   Glucose-Capillary 125 (H) 70 - 99 mg/dL  Glucose, capillary     Status: None   Collection Time: 10/30/18  8:57 PM  Result Value Ref Range   Glucose-Capillary 79 70 - 99 mg/dL  Glucose, capillary     Status: None   Collection Time: 10/31/18  2:00 AM  Result Value Ref Range   Glucose-Capillary 75 70 - 99 mg/dL  Glucose, capillary     Status: Abnormal   Collection Time: 10/31/18  6:47 AM  Result Value Ref Range   Glucose-Capillary 47 (L) 70 - 99 mg/dL  Glucose, capillary     Status: Abnormal   Collection Time: 10/31/18  7:12 AM  Result Value Ref Range   Glucose-Capillary 130 (H) 70 - 99 mg/dL  Renal function panel     Status: Abnormal   Collection  Time: 10/31/18  7:56 AM  Result Value Ref Range   Sodium 135 135 - 145 mmol/L   Potassium 4.8 3.5 - 5.1 mmol/L   Chloride 95 (L) 98 - 111 mmol/L   CO2 25 22 - 32 mmol/L   Glucose, Bld 96 70 - 99 mg/dL   BUN 38 (H) 6 - 20 mg/dL   Creatinine, Ser 9.42 (H) 0.61 - 1.24 mg/dL   Calcium 8.9 8.9 - 10.3 mg/dL   Phosphorus 7.8 (H) 2.5 - 4.6 mg/dL   Albumin 2.4 (L) 3.5 - 5.0 g/dL   GFR calc non Af Amer 6 (L) >60 mL/min   GFR calc Af Amer 7 (L) >60 mL/min   Anion gap 15 5 - 15  CBC     Status: Abnormal   Collection Time: 10/31/18  7:57 AM  Result Value Ref Range   WBC 10.9 (H) 4.0 - 10.5 K/uL   RBC 3.14 (L) 4.22 - 5.81 MIL/uL   Hemoglobin 9.2 (L) 13.0 - 17.0 g/dL   HCT 30.8 (L) 39.0 - 52.0 %   MCV 98.1 80.0 - 100.0 fL   MCH 29.3 26.0 - 34.0 pg   MCHC 29.9 (L) 30.0 - 36.0 g/dL   RDW 14.9 11.5 - 15.5 %   Platelets 295 150 -  400 K/uL   nRBC 0.0 0.0 - 0.2 %  Glucose, capillary     Status: None   Collection Time: 10/31/18  8:00 AM  Result Value Ref Range   Glucose-Capillary 88 70 - 99 mg/dL  Glucose, capillary     Status: Abnormal   Collection Time: 10/31/18 10:53 AM  Result Value Ref Range   Glucose-Capillary 120 (H) 70 - 99 mg/dL    Results for orders placed or performed during the hospital encounter of 10/26/18  Blood culture (routine x 2)     Status: None   Collection Time: 10/26/18  5:20 PM  Result Value Ref Range Status   Specimen Description BLOOD RIGHT ANTECUBITAL  Final   Special Requests   Final    BOTTLES DRAWN AEROBIC AND ANAEROBIC Blood Culture adequate volume   Culture   Final    NO GROWTH 5 DAYS Performed at [facility], [place] [address] [place], [place] [address]    Report Status 10/31/2018 FINAL  Final  Blood culture (routine x 2)     Status: None   Collection Time: 10/26/18  5:25 PM  Result Value Ref Range Status   Specimen Description BLOOD RIGHT FOREARM  Final   Special Requests   Final    BOTTLES DRAWN AEROBIC AND ANAEROBIC Blood Culture adequate  volume   Culture   Final    NO GROWTH 5 DAYS Performed at [facility], [place] [address], [place], [place] [address]    Report Status 10/31/2018 FINAL  Final  MRSA PCR Screening     Status: None   Collection Time: 10/27/18  4:11 AM  Result Value Ref Range Status   MRSA by PCR NEGATIVE NEGATIVE Final    Comment:        The GeneXpert MRSA Assay (FDA approved for NASAL specimens only), is one component of a comprehensive MRSA colonization surveillance program. It is not intended to diagnose MRSA infection nor to guide or monitor treatment for MRSA infections. Performed at [facility], [place] [address] [place], [place] [address]   Aerobic/Anaerobic Culture (surgical/deep wound)     Status: None (Preliminary result)   Collection Time: 10/28/18  3:05 PM  Result Value Ref Range Status   Specimen Description TISSUE TOE LEFT  Final   Special Requests NONE  Final   Gram Stain   Final    NO WBC SEEN NO ORGANISMS SEEN Performed at [facility], [address], [place], [place] [address]    Culture   Final    RARE STAPHYLOCOCCUS AUREUS NO ANAEROBES ISOLATED; CULTURE IN PROGRESS FOR 5 DAYS    Report Status PENDING  Incomplete   Organism ID, Bacteria STAPHYLOCOCCUS AUREUS  Final      Susceptibility   Staphylococcus aureus - MIC*    CIPROFLOXACIN <=0.5 SENSITIVE Sensitive     ERYTHROMYCIN <=0.25 SENSITIVE Sensitive     GENTAMICIN <=0.5 SENSITIVE Sensitive     OXACILLIN <=0.25 SENSITIVE Sensitive     TETRACYCLINE <=1 SENSITIVE Sensitive     VANCOMYCIN <=0.5 SENSITIVE Sensitive     TRIMETH/SULFA <=10 SENSITIVE Sensitive     CLINDAMYCIN <=0.25 SENSITIVE Sensitive     RIFAMPIN <=0.5 SENSITIVE Sensitive     Inducible Clindamycin NEGATIVE Sensitive     * RARE STAPHYLOCOCCUS AUREUS    Assessment & Plan:  Patient was evaluated and treated and all questions answered.  Gangrene L Toe, Wound Left foot s/p amputation and debridement, application of VAC - Wound  VAC on and  functioning. -Wound to macerated for VAC to be reapplied today - Micro pending.  Pan-sensitive staph - Path pending. - Still with slightly elevated WBC - Wound macerated today unable to reapply wound VAC, I think he would benefit from one final debridement with possible application of skin graft substitute prior to discharge if patient amenable. We will discuss tomorrow a.m. and plan for surgery that afternoon, with anticipated discharge Wednesday.     Will continue to follow.

## 2018-10-31 NOTE — Progress Notes (Signed)
Patient's CBG is 88 offered him something to eat and drink but he refused, stating that it was too early to eat and started the regular coke he was drinking was good enough.

## 2018-10-31 NOTE — Progress Notes (Signed)
At 0200, the patient's CBG was 75.  He drank a coke and ate half of a small bag of pretzels.  At approximately 0640, I woke the patient for hemodialysis.  He was altered.  He did not know my name and kept staring into space at times.  His CBG at 0647 was 47.  I gave him a amp of D50.  He had called his mom on his cell phone but kept asking me to call his mom.  She was talking to him on the phone but he did not hear her.  He kept asking me to get Ms. Joelene Millin even though I was standing in front of him.  Dr. March Rummage came in to do his left foot dressing change.  At first he knew who his doctor was but then he did not recognize him.  He was weepy at times.  His CBG at 0712 was 130.  He insisted on going to HD and by this time, his mental status was clearing.  At the time of transport to HD, he was more alert but still acted almost childlike.  Will continue to monitor patient once he is back on our floor.  Earleen Reaper RN

## 2018-10-31 NOTE — Progress Notes (Signed)
Subjective:  Seen in hd at end of tx , no sob,cp .But he is concerned about pulling fluid and above his edw and next sentence tells me is bored in room and drinking excess fluids. I stressed need for compliance with fluids   Objective Vital signs in last 24 hours: Vitals:   10/30/18 1643 10/30/18 2050 10/31/18 0638 10/31/18 0830  BP: (!) 202/94 (!) 143/115 (!) 186/70 (!) 164/103  Pulse: 90 85 90 (!) 110  Resp:  20 18 18   Temp: 98.3 F (36.8 C) 98.6 F (37 C) 98.3 F (36.8 C) 98.2 F (36.8 C)  TempSrc: Oral Oral Oral Oral  SpO2: 98% 100% 100% 96%  Weight:  77 kg  84.2 kg  Height:       Weight change: 0.7 kg   Physical Exam: General:Well appearing man, NAD Heart:RRR; no murmur Lungs:CTAB Abdomen:soft, non-tender Extremities:L foot bandaged with wound vac, no RLE edema Dialysis Access:L AVG patent on HD   Dialysis Orders: [facility] MWF 4 hr EDW 75.5 kg heparin 8000 2 K 2 Ca left upper AVGG calcitriol 2.5 venofer 100 x 7 on hold due to recent infection. hgb at d/c 11.2 no ESA at present - last Mircera was 225 12/10 and prior to that 75  Problem/Plan: 1.Leftfoot osteo s/p two I&D: S/p L great toe amputation 1/31- wound vac in place. Per podiatry. On Vanc/Unasyn. Prelim wound Cx + staph aureus. 2. ESRD: Continue HD per MWF schedule -. 3. Anemia: Hgb 9.2 - continue Friday weekly Aranesp 100  4. Secondary hyperparathyroidism: Ca/Phos 5.9>7.8  On  sevelamer 2400mg ,Increase to 3200g ac sensipar, calcitriol 5.HTN/volume: BP ok, tolerated 3 l uf today fu standing  Wt 81.2 kg   / keeping same EDW for now.Pt needs to be responsible for his diet  6. Nutrition: Alb 2.4<2.2  continue ^ protein diet. 7. DM: Per primary.  [name], PA-C [facility] [phone] 10/31/2018,11:52 AM  LOS: 5 days   Labs: Basic Metabolic Panel: Recent Labs  Lab 10/27/18 0426 10/28/18 0800 10/31/18 0756  NA 132* 132* 135  K 4.1 4.8 4.8  CL 91* 94* 95*  CO2 29 26 25   GLUCOSE  405* 211* 96  BUN 20 33* 38*  CREATININE 7.01* 9.01* 9.42*  CALCIUM 9.1 9.2 8.9  PHOS  --  5.9* 7.8*   Liver Function Tests: Recent Labs  Lab 10/26/18 1716 10/28/18 0800 10/31/18 0756  AST 23  --   --   ALT 5  --   --   ALKPHOS 112  --   --   BILITOT 0.7  --   --   PROT 7.3  --   --   ALBUMIN 2.5* 2.1* 2.4*   No results for input(s): LIPASE, AMYLASE in the last 168 hours. No results for input(s): AMMONIA in the last 168 hours. CBC: Recent Labs  Lab 10/26/18 1716 10/27/18 0426 10/31/18 0757  WBC 13.4* 9.6 10.9*  NEUTROABS 11.0*  --   --   HGB 10.0* 9.4* 9.2*  HCT 33.3* 30.8* 30.8*  MCV 96.8 96.6 98.1  PLT 182 212 295   Cardiac Enzymes: No results for input(s): CKTOTAL, CKMB, CKMBINDEX, TROPONINI in the last 168 hours. CBG: Recent Labs  Lab 10/31/18 0200 10/31/18 0647 10/31/18 0712 10/31/18 0800 10/31/18 1053  GLUCAP 75 47* 130* 88 120*    Studies/Results: No results found. Medications: . sodium chloride    . sodium chloride 10 mL/hr at 10/28/18 1408  . ampicillin-sulbactam (UNASYN) IV 1.5 g (  10/30/18 2204)  . ferric gluconate (FERRLECIT/NULECIT) IV 62.5 mg (10/28/18 1128)   . calcitRIOL      . calcitRIOL  2.5 mcg Oral Q M,W,F-HD  . cinacalcet  30 mg Oral Q supper  . darbepoetin (ARANESP) injection - DIALYSIS  100 mcg Intravenous Q Fri-HD  . insulin aspart  0-5 Units Subcutaneous QHS  . insulin aspart  0-9 Units Subcutaneous TID WC  . insulin glargine  7 Units Subcutaneous BID  . multivitamin  1 tablet Oral QHS  . sevelamer carbonate  2,400 mg Oral TID WC  . sodium chloride flush  3 mL Intravenous Q12H

## 2018-11-01 ENCOUNTER — Encounter (HOSPITAL_COMMUNITY)
Admission: EM | Disposition: A | Discharge status: Home Health | Payer: Self-pay | Source: Home / Self Care | Attending: Internal Medicine

## 2018-11-01 ENCOUNTER — Inpatient Hospital Stay (HOSPITAL_COMMUNITY): Payer: Medicare Other | Admitting: Anesthesiology

## 2018-11-01 ENCOUNTER — Encounter (HOSPITAL_COMMUNITY): Payer: Self-pay | Admitting: Certified Registered"

## 2018-11-01 DIAGNOSIS — E08621 Diabetes mellitus due to underlying condition with foot ulcer: Secondary | ICD-10-CM

## 2018-11-01 DIAGNOSIS — S91302A Unspecified open wound, left foot, initial encounter: Secondary | ICD-10-CM

## 2018-11-01 DIAGNOSIS — L97426 Non-pressure chronic ulcer of left heel and midfoot with bone involvement without evidence of necrosis: Secondary | ICD-10-CM

## 2018-11-01 DIAGNOSIS — Z4781 Encounter for orthopedic aftercare following surgical amputation: Secondary | ICD-10-CM

## 2018-11-01 HISTORY — PX: IRRIGATION AND DEBRIDEMENT FOOT: SHX6602

## 2018-11-01 HISTORY — PX: APPLICATION OF WOUND VAC: SHX5189

## 2018-11-01 LAB — GLUCOSE, CAPILLARY
Glucose-Capillary: 133 mg/dL — ABNORMAL HIGH (ref 70–99)
Glucose-Capillary: 207 mg/dL — ABNORMAL HIGH (ref 70–99)
Glucose-Capillary: 182 mg/dL — ABNORMAL HIGH (ref 70–99)
Glucose-Capillary: 138 mg/dL — ABNORMAL HIGH (ref 70–99)
Glucose-Capillary: 164 mg/dL — ABNORMAL HIGH (ref 70–99)
Glucose-Capillary: 199 mg/dL — ABNORMAL HIGH (ref 70–99)

## 2018-11-01 SURGERY — IRRIGATION AND DEBRIDEMENT FOOT
Anesthesia: General | Laterality: Left

## 2018-11-01 MED ORDER — MIDAZOLAM HCL 5 MG/5ML IJ SOLN
INTRAMUSCULAR | Status: DC | PRN
  Administered 2018-11-01: 2 mg via INTRAVENOUS

## 2018-11-01 MED ORDER — SODIUM CHLORIDE 0.9 % IV SOLN: INTRAVENOUS | Status: DC

## 2018-11-01 MED ORDER — BUPIVACAINE HCL (PF) 0.5 % IJ SOLN
INTRAMUSCULAR | Status: AC
  Filled 2018-11-01: qty 30

## 2018-11-01 MED ORDER — ONDANSETRON HCL 4 MG/2ML IJ SOLN
INTRAMUSCULAR | Status: DC | PRN
  Administered 2018-11-01: 4 mg via INTRAVENOUS

## 2018-11-01 MED ORDER — BUPIVACAINE HCL (PF) 0.5 % IJ SOLN
INTRAMUSCULAR | Status: DC | PRN
  Administered 2018-11-01: 10 mL

## 2018-11-01 MED ORDER — ONDANSETRON HCL 4 MG/2ML IJ SOLN
INTRAMUSCULAR | Status: AC
  Filled 2018-11-01: qty 2

## 2018-11-01 MED ORDER — SODIUM CHLORIDE 0.9 % IR SOLN
Status: DC | PRN
  Administered 2018-11-01: 3000 mL

## 2018-11-01 MED ORDER — VANCOMYCIN HCL 1000 MG IV SOLR
INTRAVENOUS | Status: DC | PRN
  Administered 2018-11-01: 1000 mg

## 2018-11-01 MED ORDER — FENTANYL CITRATE (PF) 250 MCG/5ML IJ SOLN
INTRAMUSCULAR | Status: AC
  Filled 2018-11-01: qty 5

## 2018-11-01 MED ORDER — PROPOFOL 10 MG/ML IV BOLUS
INTRAVENOUS | Status: DC | PRN
  Administered 2018-11-01: 150 mg via INTRAVENOUS

## 2018-11-01 MED ORDER — LIDOCAINE 2% (20 MG/ML) 5 ML SYRINGE
INTRAMUSCULAR | Status: DC | PRN
  Administered 2018-11-01: 60 mg via INTRAVENOUS

## 2018-11-01 MED ORDER — FENTANYL CITRATE (PF) 100 MCG/2ML IJ SOLN: 25.0000 ug | INTRAMUSCULAR | Status: DC | PRN

## 2018-11-01 MED ORDER — MIDAZOLAM HCL 2 MG/2ML IJ SOLN
INTRAMUSCULAR | Status: AC
  Filled 2018-11-01: qty 2

## 2018-11-01 MED ORDER — ONDANSETRON HCL 4 MG/2ML IJ SOLN: 4.0000 mg | Freq: Once | INTRAMUSCULAR | Status: DC | PRN

## 2018-11-01 MED ORDER — DEXAMETHASONE SODIUM PHOSPHATE 10 MG/ML IJ SOLN
INTRAMUSCULAR | Status: DC | PRN
  Administered 2018-11-01: 4 mg via INTRAVENOUS

## 2018-11-01 MED ORDER — 0.9 % SODIUM CHLORIDE (POUR BTL) OPTIME
TOPICAL | Status: DC | PRN
  Administered 2018-11-01: 1000 mL

## 2018-11-01 MED ORDER — DEXAMETHASONE SODIUM PHOSPHATE 10 MG/ML IJ SOLN
INTRAMUSCULAR | Status: AC
  Filled 2018-11-01: qty 1

## 2018-11-01 MED ORDER — LIDOCAINE 2% (20 MG/ML) 5 ML SYRINGE
INTRAMUSCULAR | Status: AC
  Filled 2018-11-01: qty 5

## 2018-11-01 MED ORDER — VANCOMYCIN HCL 1000 MG IV SOLR
INTRAVENOUS | Status: AC
  Filled 2018-11-01: qty 1000

## 2018-11-01 SURGICAL SUPPLY — 50 items
BANDAGE ACE 4X5 VEL STRL LF (GAUZE/BANDAGES/DRESSINGS) IMPLANT
BANDAGE ELASTIC 4 VELCRO ST LF (GAUZE/BANDAGES/DRESSINGS) ×2 IMPLANT
BNDG ESMARK 4X9 LF (GAUZE/BANDAGES/DRESSINGS) IMPLANT
BNDG GAUZE ELAST 4 BULKY (GAUZE/BANDAGES/DRESSINGS) IMPLANT
CHLORAPREP W/TINT 26ML (MISCELLANEOUS) ×2 IMPLANT
COVER SURGICAL LIGHT HANDLE (MISCELLANEOUS) ×2 IMPLANT
COVER WAND RF STERILE (DRAPES) ×2 IMPLANT
CUFF TOURNIQUET SINGLE 18IN (TOURNIQUET CUFF) IMPLANT
CUFF TOURNIQUET SINGLE 34IN LL (TOURNIQUET CUFF) IMPLANT
DRAPE U-SHAPE 47X51 STRL (DRAPES) ×2 IMPLANT
DRSG ADAPTIC 3X8 NADH LF (GAUZE/BANDAGES/DRESSINGS) ×2 IMPLANT
DRSG VAC ATS MED SENSATRAC (GAUZE/BANDAGES/DRESSINGS) ×2 IMPLANT
ELECT CAUTERY BLADE 6.4 (BLADE) ×2 IMPLANT
ELECT REM PT RETURN 9FT ADLT (ELECTROSURGICAL) ×2
ELECTRODE REM PT RTRN 9FT ADLT (ELECTROSURGICAL) ×1 IMPLANT
GAUZE SPONGE 4X4 12PLY STRL (GAUZE/BANDAGES/DRESSINGS) IMPLANT
GEL CELLERATE 28G (Miscellaneous) ×2 IMPLANT
GLOVE BIO SURGEON STRL SZ7.5 (GLOVE) ×2 IMPLANT
GLOVE BIOGEL PI IND STRL 8 (GLOVE) ×1 IMPLANT
GLOVE BIOGEL PI INDICATOR 8 (GLOVE) ×1
GOWN STRL REUS W/ TWL LRG LVL3 (GOWN DISPOSABLE) ×1 IMPLANT
GOWN STRL REUS W/ TWL XL LVL3 (GOWN DISPOSABLE) ×1 IMPLANT
GOWN STRL REUS W/TWL LRG LVL3 (GOWN DISPOSABLE) ×1
GOWN STRL REUS W/TWL XL LVL3 (GOWN DISPOSABLE) ×1
IMPL PRIMATRIX AG 2:1 6X6CM (Tissue) ×1 IMPLANT
IMPLANT PRIMATRIX AG 2:1 6X6CM (Tissue) ×2 IMPLANT
KIT BASIN OR (CUSTOM PROCEDURE TRAY) ×2 IMPLANT
KIT TURNOVER KIT B (KITS) ×2 IMPLANT
MANIFOLD NEPTUNE II (INSTRUMENTS) ×2 IMPLANT
NEEDLE BIOPSY JAMSHIDI 8X6 (NEEDLE) IMPLANT
NEEDLE HYPO 25GX1X1/2 BEV (NEEDLE) IMPLANT
NS IRRIG 1000ML POUR BTL (IV SOLUTION) ×2 IMPLANT
PACK ORTHO EXTREMITY (CUSTOM PROCEDURE TRAY) ×2 IMPLANT
PAD ARMBOARD 7.5X6 YLW CONV (MISCELLANEOUS) ×4 IMPLANT
PADDING CAST SYNTHETIC 4 (CAST SUPPLIES) ×1
PADDING CAST SYNTHETIC 4X4 STR (CAST SUPPLIES) ×1 IMPLANT
PROBE DEBRIDE SONICVAC MISONIX (TIP) IMPLANT
SCRUB BETADINE 4OZ XXX (MISCELLANEOUS) ×2 IMPLANT
SET CYSTO W/LG BORE CLAMP LF (SET/KITS/TRAYS/PACK) ×2 IMPLANT
SOL PREP POV-IOD 4OZ 10% (MISCELLANEOUS) ×2 IMPLANT
SUT CHROMIC 4 0 PS 2 18 (SUTURE) ×2 IMPLANT
SUT ETHILON 2 0 PSLX (SUTURE) ×4 IMPLANT
SUT ETHILON 3 0 PS 1 (SUTURE) ×4 IMPLANT
SUT VIC AB 3-0 SH 27 (SUTURE) ×2
SUT VIC AB 3-0 SH 27X BRD (SUTURE) ×2 IMPLANT
SYR CONTROL 10ML LL (SYRINGE) IMPLANT
TOWEL GREEN STERILE (TOWEL DISPOSABLE) ×2 IMPLANT
TOWEL GREEN STERILE FF (TOWEL DISPOSABLE) ×2 IMPLANT
TUBE CONNECTING 12X1/4 (SUCTIONS) ×2 IMPLANT
YANKAUER SUCT BULB TIP NO VENT (SUCTIONS) ×2 IMPLANT

## 2018-11-01 NOTE — Progress Notes (Signed)
[facility] PROGRESS NOTE    Progress Note  [name]  UDJ:[id number] DOB: 18-Jan-1981 DOA: 10/26/2018 PCP: Patient, No Pcp Per     Brief Narrative:   [name] is an 38 y.o. male past medical history history significant for end-stage renal disease with dialysis Monday Wednesday Friday last dialysis was on the day of admission chronic bilateral wound infections diabetes mellitus, recently discharged on the day prior to admission with a wound VAC and oral antibiotics, the wound VAC nurse relates the wound VAC was not on on appropriately when she removed that some skin peeled off and it was very red.  The podiatrist was consulted who relates he would need an amputation but the patient has been reluctant to do so in the past.  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  Afebrile, but has a worsening leukocytosis. - Status post surgical intervention on 10/28/2018 with a left great toe amputation with I&D. - Dr. March Rummage reevaluated the patient and recommended I&D on 11/01/2018 -Culture data during surgical intervention show MSSA he is currently on Unasyn which should cover.  ESRD (end stage renal disease) on dialysis [facility]) - Further management per nephrology.  Anemia of chronic disease - Further management per renal.  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  DVT prophylaxis: lovenox Family Communication:none Disposition Plan/Barrier to D/C: unable to determine Code Status:     Code Status Orders  (From admission, onward)         Start     Ordered   10/26/18 1949  Full code  Continuous     10/26/18 1949        Code Status History    Date Active Date  Inactive Code Status Order ID Comments User Context   10/20/2018 1730 10/25/2018 2038 Full Code [id number]  Modena Jansky, MD Inpatient   08/17/2018 2332 08/25/2018 1645 Full Code [id number]  Shela Leff, MD Inpatient   02/01/2018 1446 02/06/2018 0032 Full Code [id number]  Jule Ser, DO ED   10/25/2017 2225 11/03/2017 2322 Full Code [id number]  [name], MD ED   11/16/2016 1338 11/16/2016 1649 Full Code [id number]  Elam Dutch, MD Inpatient   05/27/2015 0707 05/29/2015 1809 Full Code [id number]  Harrie Foreman, MD Inpatient        IV Access:    Peripheral IV   Procedures and diagnostic studies:   No results found.   Medical Consultants:    None.  Anti-Infectives:   IV Unasyn  Subjective:    [name] relates his pain is controlled.   Objective:    Vitals:   10/31/18 1805 10/31/18 2037 11/01/18 0607 11/01/18 0819  BP: 129/63 (!) 185/74 (!) 172/102 (!) 177/74  Pulse: 72 96 89 83  Resp: (!) 21 18 19 18   Temp: (!) 97.4 F (36.3 C) 98.4 F (36.9 C) 98.6 F (37 C) 98.1 F (36.7 C)  TempSrc: Oral Oral Oral Oral  SpO2: 98% 100% 100% 98%  Weight:      Height:        Intake/Output Summary (Last 24 hours) at 11/01/2018 0935 Last data filed at 11/01/2018 0900 Gross per 24 hour  Intake 1660 ml  Output 3000 ml  Net -1340 ml  Filed Weights   10/30/18 2050 10/31/18 0730 10/31/18 1145  Weight: 77 kg 84.2 kg 81.2 kg    Exam: General exam: In no acute distress. Respiratory system: Good air movement and clear to auscultation. Cardiovascular system: S1 & S2 heard, RRR.  Gastrointestinal system: Abdomen is nondistended, soft and nontender.  Central nervous system: Alert and oriented. No focal neurological deficits. Extremities: No pedal edema. Skin: Dressing with wound VAC on the left foot Psychiatry: Patient has poor insight on medical condition.   Data Reviewed:    Labs: Basic Metabolic Panel: Recent Labs  Lab 10/26/18 1716  10/27/18 0426 10/28/18 0800 10/31/18 0756  NA 132* 132* 132* 135  K 3.8 4.1 4.8 4.8  CL 91* 91* 94* 95*  CO2 26 29 26 25   GLUCOSE 203* 405* 211* 96  BUN 13 20 33* 38*  CREATININE 5.83* 7.01* 9.01* 9.42*  CALCIUM 9.4 9.1 9.2 8.9  PHOS  --   --  5.9* 7.8*   GFR Estimated Creatinine Clearance: 10.5 mL/min (A) (by C-G formula based on SCr of 9.42 mg/dL (H)). Liver Function Tests: Recent Labs  Lab 10/26/18 1716 10/28/18 0800 10/31/18 0756  AST 23  --   --   ALT 5  --   --   ALKPHOS 112  --   --   BILITOT 0.7  --   --   PROT 7.3  --   --   ALBUMIN 2.5* 2.1* 2.4*   No results for input(s): LIPASE, AMYLASE in the last 168 hours. No results for input(s): AMMONIA in the last 168 hours. Coagulation profile No results for input(s): INR, PROTIME in the last 168 hours.  CBC: Recent Labs  Lab 10/26/18 1716 10/27/18 0426 10/31/18 0757  WBC 13.4* 9.6 10.9*  NEUTROABS 11.0*  --   --   HGB 10.0* 9.4* 9.2*  HCT 33.3* 30.8* 30.8*  MCV 96.8 96.6 98.1  PLT 182 212 295   Cardiac Enzymes: No results for input(s): CKTOTAL, CKMB, CKMBINDEX, TROPONINI in the last 168 hours. BNP (last 3 results) No results for input(s): PROBNP in the last 8760 hours. CBG: Recent Labs  Lab 10/31/18 1053 10/31/18 1231 10/31/18 1558 10/31/18 2047 11/01/18 0653  GLUCAP 120* 117* 126* 170* 133*   D-Dimer: No results for input(s): DDIMER in the last 72 hours. Hgb A1c: No results for input(s): HGBA1C in the last 72 hours. Lipid Profile: No results for input(s): CHOL, HDL, LDLCALC, TRIG, CHOLHDL, LDLDIRECT in the last 72 hours. Thyroid function studies: No results for input(s): TSH, T4TOTAL, T3FREE, THYROIDAB in the last 72 hours.  Invalid input(s): FREET3 Anemia work up: No results for input(s): VITAMINB12, FOLATE, FERRITIN, TIBC, IRON, RETICCTPCT in the last 72 hours. Sepsis Labs: Recent Labs  Lab 10/26/18 1716 10/26/18 1822 10/27/18 0426 10/31/18 0757  WBC 13.4*  --  9.6 10.9*   LATICACIDVEN 2.6* 2.0*  --   --    Microbiology Recent Results (from the past 240 hour(s))  Blood culture (routine x 2)     Status: None   Collection Time: 10/26/18  5:20 PM  Result Value Ref Range Status   Specimen Description BLOOD RIGHT ANTECUBITAL  Final   Special Requests   Final    BOTTLES DRAWN AEROBIC AND ANAEROBIC Blood Culture adequate volume   Culture   Final    NO GROWTH 5 DAYS Performed at [facility], [address] [place], [place] [address]    Report Status 10/31/2018 FINAL  Final  Blood culture (routine  x 2)     Status: None   Collection Time: 10/26/18  5:25 PM  Result Value Ref Range Status   Specimen Description BLOOD RIGHT FOREARM  Final   Special Requests   Final    BOTTLES DRAWN AEROBIC AND ANAEROBIC Blood Culture adequate volume   Culture   Final    NO GROWTH 5 DAYS Performed at [facility], [address], [place], [place] [address]    Report Status 10/31/2018 FINAL  Final  MRSA PCR Screening     Status: None   Collection Time: 10/27/18  4:11 AM  Result Value Ref Range Status   MRSA by PCR NEGATIVE NEGATIVE Final    Comment:        The GeneXpert MRSA Assay (FDA approved for NASAL specimens only), is one component of a comprehensive MRSA colonization surveillance program. It is not intended to diagnose MRSA infection nor to guide or monitor treatment for MRSA infections. Performed at [facility], [place] [address] [place], [place] [address]   Aerobic/Anaerobic Culture (surgical/deep wound)     Status: None (Preliminary result)   Collection Time: 10/28/18  3:05 PM  Result Value Ref Range Status   Specimen Description TISSUE TOE LEFT  Final   Special Requests NONE  Final   Gram Stain   Final    NO WBC SEEN NO ORGANISMS SEEN Performed at [facility], [address] [place], [name] [address]    Culture   Final    RARE STAPHYLOCOCCUS AUREUS NO ANAEROBES ISOLATED; CULTURE IN PROGRESS FOR 5 DAYS    Report Status  PENDING  Incomplete   Organism ID, Bacteria STAPHYLOCOCCUS AUREUS  Final      Susceptibility   Staphylococcus aureus - MIC*    CIPROFLOXACIN <=0.5 SENSITIVE Sensitive     ERYTHROMYCIN <=0.25 SENSITIVE Sensitive     GENTAMICIN <=0.5 SENSITIVE Sensitive     OXACILLIN <=0.25 SENSITIVE Sensitive     TETRACYCLINE <=1 SENSITIVE Sensitive     VANCOMYCIN <=0.5 SENSITIVE Sensitive     TRIMETH/SULFA <=10 SENSITIVE Sensitive     CLINDAMYCIN <=0.25 SENSITIVE Sensitive     RIFAMPIN <=0.5 SENSITIVE Sensitive     Inducible Clindamycin NEGATIVE Sensitive     * RARE STAPHYLOCOCCUS AUREUS     Medications:   . calcitRIOL  2.5 mcg Oral Q M,W,F-HD  . cinacalcet  30 mg Oral Q supper  . darbepoetin (ARANESP) injection - DIALYSIS  100 mcg Intravenous Q Fri-HD  . insulin aspart  0-5 Units Subcutaneous QHS  . insulin aspart  0-9 Units Subcutaneous TID WC  . insulin glargine  5 Units Subcutaneous BID  . multivitamin  1 tablet Oral QHS  . sevelamer carbonate  2,400 mg Oral TID WC  . sodium chloride flush  3 mL Intravenous Q12H   Continuous Infusions: . sodium chloride    . sodium chloride 10 mL/hr at 10/28/18 1408  . ampicillin-sulbactam (UNASYN) IV 1.5 g (10/31/18 2200)  . ferric gluconate (FERRLECIT/NULECIT) IV 62.5 mg (10/28/18 1128)      LOS: 6 days   Charlynne Cousins  [facility]  11/01/2018, 9:35 AM

## 2018-11-01 NOTE — Progress Notes (Signed)
Late entry prior to patient going back Shane Schooner, CRNA aware that I was unable to obtain blood for I- Stat. After patient left unit I took his phone, mini- nintendo, and two charging cords to PACU in labeled bag.

## 2018-11-01 NOTE — Progress Notes (Signed)
Patient transferred to [facility]. Attempt to obtain consent prior to transfer. Patient not agreeable to sign due to mention of possible skin graft. Wishes to speak with Dr. March Rummage prior to surgery before he will sign consent. Heath Lark, RN made aware. Patient also refuses to leave his cell phone and charger in the room. Dorthey Sawyer, RN

## 2018-11-01 NOTE — Progress Notes (Signed)
Patient continue to be ugly to staff. Cursing and loud. " I told them early today before I left for surgery to have my hot food ready." Patient transferred to the floor around  1945 PM. Dietary was called at [facility] to request for his food and explained his situation but per dietary they can only serve him cold sandwich and chips.  Cold sandwich tray came up and I personally  delivered it to his room. Patient got mad and cursing. " I am not eating that F...ng cold food."  AC aware of situation and will come to see patient.

## 2018-11-01 NOTE — Progress Notes (Addendum)
Subjective:  For debridement today and hd tomorrow   Objective Vital signs in last 24 hours: Vitals:   10/31/18 1805 10/31/18 2037 11/01/18 0607 11/01/18 0819  BP: 129/63 (!) 185/74 (!) 172/102 (!) 177/74  Pulse: 72 96 89 83  Resp: (!) 21 18 19 18   Temp: (!) 97.4 F (36.3 C) 98.4 F (36.9 C) 98.6 F (37 C) 98.1 F (36.7 C)  TempSrc: Oral Oral Oral Oral  SpO2: 98% 100% 100% 98%  Weight:      Height:       Weight change: 7.2 kg  Physical Exam: General:now NAD Heart:RRR; no murmur Lungs:CTAB Abdomen:soft, non-tender Extremities:L foot bandaged with wound vac, no RLE edema Dialysis Access:L AVG pos bruit    Dialysis Orders: [facility] MWF 4 hr EDW 75.5 kg heparin 8000 2 K 2 Ca left upper AVGG calcitriol 2.5 venofer 100 x 7 on hold due to recent infection. hgb at d/c 11.2 no ESA at present - last Mircera was 225 12/10 and prior to that 75  Problem/Plan: 1.Leftfoot osteo s/p two I&D: S/p L great toe amputation 1/31- wound vac in place. Per podiatry. On Vanc/Unasyn.Prelim wound Cx + staph aureus./ for debridement today  2. ESRD: Continue HD per MWF schedule - hd tomor on schedule  3. Anemia: Hgb 9.2 - continue Friday weekly Aranesp 100  4. Secondary hyperparathyroidism: Ca/Phos 5.9>7.8  On  sevelamer 2400mg ,Increase to 3200g ac sensipar, calcitriol 5.HTN/volume: BP ok, tolerated 3 l uf yesterday /  Post wt Wt 81.2 kg 02/03 hd   (EDW 75.5)   / keeping same EDW for now.Pt needs to be responsible for his diet / uf  5 liter attempt as bp allows 02/04  6. Nutrition: Alb 2.4<2.2  continue ^ protein diet. 7. DM: Per primary  [name], PA-C [facility] [phone] 11/01/2018,9:40 AM  LOS: 6 days   Labs: Basic Metabolic Panel: Recent Labs  Lab 10/27/18 0426 10/28/18 0800 10/31/18 0756  NA 132* 132* 135  K 4.1 4.8 4.8  CL 91* 94* 95*  CO2 29 26 25   GLUCOSE 405* 211* 96  BUN 20 33* 38*  CREATININE 7.01* 9.01* 9.42*  CALCIUM 9.1 9.2 8.9  PHOS  --   5.9* 7.8*   Liver Function Tests: Recent Labs  Lab 10/26/18 1716 10/28/18 0800 10/31/18 0756  AST 23  --   --   ALT 5  --   --   ALKPHOS 112  --   --   BILITOT 0.7  --   --   PROT 7.3  --   --   ALBUMIN 2.5* 2.1* 2.4*   No results for input(s): LIPASE, AMYLASE in the last 168 hours. No results for input(s): AMMONIA in the last 168 hours. CBC: Recent Labs  Lab 10/26/18 1716 10/27/18 0426 10/31/18 0757  WBC 13.4* 9.6 10.9*  NEUTROABS 11.0*  --   --   HGB 10.0* 9.4* 9.2*  HCT 33.3* 30.8* 30.8*  MCV 96.8 96.6 98.1  PLT 182 212 295   Cardiac Enzymes: No results for input(s): CKTOTAL, CKMB, CKMBINDEX, TROPONINI in the last 168 hours. CBG: Recent Labs  Lab 10/31/18 1053 10/31/18 1231 10/31/18 1558 10/31/18 2047 11/01/18 0653  GLUCAP 120* 117* 126* 170* 133*    Studies/Results: No results found. Medications: . sodium chloride    . sodium chloride 10 mL/hr at 10/28/18 1408  . ampicillin-sulbactam (UNASYN) IV 1.5 g (10/31/18 2200)  . ferric gluconate (FERRLECIT/NULECIT) IV 62.5 mg (10/28/18 1128)   .  calcitRIOL  2.5 mcg Oral Q M,W,F-HD  . cinacalcet  30 mg Oral Q supper  . darbepoetin (ARANESP) injection - DIALYSIS  100 mcg Intravenous Q Fri-HD  . insulin aspart  0-5 Units Subcutaneous QHS  . insulin aspart  0-9 Units Subcutaneous TID WC  . insulin glargine  5 Units Subcutaneous BID  . multivitamin  1 tablet Oral QHS  . sevelamer carbonate  2,400 mg Oral TID WC  . sodium chloride flush  3 mL Intravenous Q12H

## 2018-11-01 NOTE — Op Note (Addendum)
Patient Name: [name] DOB: 1981-07-28  MRN: [id number]   Date of Service: 11/01/2018   Surgeon: Dr. Hardie Pulley, DPM Assistants: None Pre-operative Diagnosis: Left foot diabetic foot infection, cellulitis of left foot, open wound of left foot Post-operative Diagnosis: same Procedures:             1) Secondary Wound Closure, Complex  2) Adjacent Tissue Transfer  3) Application of Skin Graft Substitute  4) Application of Wound VAC Pathology/Specimens: * No specimens in log * Anesthesia: General Hemostasis: Anatomic Estimated Blood Loss: 25 ml Materials: Implant Name Type Inv. Item Serial No. Manufacturer Lot No. LRB No. Used  IMPLANT PRIMATRIX AG 2:1 6X6CM - [id number] Tissue IMPLANT PRIMATRIX AG 2:1 6X6CM  [employer] [id number] Left 1  GEL CELLERATE 28G - [id number] Miscellaneous GEL CELLERATE 28G [id number] [employer]  Left 1    Medications: 1g Vancomycin powder Complications: None  Indications for Procedure:  This is a 38 y.o. male with a complex wound to the left foot s/p multiple I&Ds and debridements for a severe diabetic fo foot ot infection.  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  The wound was unable to be closed and a wound VAC was applied.  He presents today for planned postoperative wound debridement and continued care.  It was discussed with patient he would additionally benefit skin graft substitute to promote healing and continued wound VAC therapy.   Procedure in Detail: Patient was identified in pre-operative holding area. Formal consent was signed and the left lower extremity was marked. Patient was brought back to the operating room and remained on a stretcher.  General endotracheal anesthesia was induced.  The extremity was prepped and draped in the usual sterile fashion. Timeout was taken to confirm patient name, laterality, and procedure prior to incision. Attention was then directed to the left foot.  The area  was anesthetized with 10 cc half percent Marcaine plain.  Secondary Wound Closure, Complex Attention was then directed to the left foot where a complex wound measuring 5 x 5.5 was noted about the first metatarsal. There was exposed first metatarsal head in the wound.  The first metatarsal head did appear healthy and viable, however, without signs of necrosis. The wound was sharply excisionally debrided with a 15 blade of nonviable tissue.  The wound was then thoroughly irrigated with 3 L of normal saline via pulse lavage.  The wound was further debrided of nonviable tissue with a rongeur. Good bleeding viable tissue was noted. At this point the wound measured 5 x 6, again with exposed 1st metatarsal bone. The deeper remaining periosteum and surrounding deep tissue was repaired with 2-0 Vicryl to provide coverage of the first metatarsal bone. Full coverage of the first metatarsal was able to be achieved.   Adjacent Tissue Transfer  At this point the distal skin flap was attempted to be brought dorsal for closure however was not able to be reapproximated.  The skin edges of both the dorsal and plantar flap were undermined to allow for better mobilization. A V to Y advancement flap was performed at the area between the 1st and 2nd metatarsal area to allow for maximal advancement of the plantar flap to the dorsal medial aspect of the wound. The flap was held in place with mattress suture of 2-0 nylon. The skin was then re-approximated with skin staples.   Application of skin graft substitute, application of wound VAC Following layered closure and adjacent tissue transfer, the  wound area was reduced from 6x5 to 4x3. For this smaller area, the wound would be left to heal by secondary intention. To accelerate this, skin graft substitute and VAC therapy was indicated. Cellerate collagen gel and 1g Vancomycin powder was then applied to the wound base. An Integra Primatrix graft was then moistened in saline and  applied to the wound base. It was sutured in position with 4-0 chromic gut suture. A layer of adaptic was applied over the graft and the closed area of the wound. A black sponge was then applied over the wound area and over the closed incision. This was adhered with the occlusive dressing. This was set to suction at 125 mmHg continuous with good seal noted.  The foot was then dressed with sterile cast padding ACE bandage. Patient tolerated the procedure well.   Disposition: Following a period of post-operative monitoring, patient will be transferred back to the floor. He may benefit from repeat debridement and skin graft substitute application for the residual open area. He will need continued wound VAC therapy. The infection does appear under control at this point. Based upon operative findings today, I am optimistic his vascular status is sufficient to heal this wound, however I am concerned his uncontrolled diabetes and his reluctance to be compliant with recommendations will complicate healing.

## 2018-11-01 NOTE — Brief Op Note (Signed)
10/26/2018 - 11/01/2018  6:52 PM  PATIENT:  [name]  38 y.o. male  PRE-OPERATIVE DIAGNOSIS:  Left Foot Wound  POST-OPERATIVE DIAGNOSIS:  Left Foot Wound  PROCEDURE:  Procedure(s): IRRIGATION AND DEBRIDEMENT PARTIAL WOUND CLOSURE LOCAL TISSUE TRANSFER AND FLAP ROTATION, LEFT FOOT (Left) APPLICATION OF WOUND VAC (Left)  SURGEON:  Surgeon(s) and Role:    * Evelina Bucy, DPM - Primary  PHYSICIAN ASSISTANT:   ASSISTANTS: none   ANESTHESIA:   local and MAC  EBL:  25 mL   BLOOD ADMINISTERED:none  DRAINS: none   LOCAL MEDICATIONS USED:  MARCAINE    and Amount: 10 ml  SPECIMEN:  None  DISPOSITION OF SPECIMEN:  N/a  COUNTS:  YES  TOURNIQUET:  * No tourniquets in log *  DICTATION: .Dragon Dictation  PLAN OF CARE: transfer to floor  PATIENT DISPOSITION:  PACU - hemodynamically stable.   Delay start of Pharmacological VTE agent (>24hrs) due to surgical blood loss or risk of bleeding: not applicable

## 2018-11-01 NOTE — Transfer of Care (Signed)
Immediate Anesthesia Transfer of Care Note  Patient: [name]  Procedure(s) Performed: IRRIGATION AND DEBRIDEMENT PARTIAL WOUND CLOSURE LOCAL TISSUE TRANSFER AND FLAP ROTATION, LEFT FOOT (Left ) APPLICATION OF WOUND VAC (Left )  Patient Location: PACU  Anesthesia Type:General  Level of Consciousness: drowsy and patient cooperative  Airway & Oxygen Therapy: Patient Spontanous Breathing and Patient connected to nasal cannula oxygen  Post-op Assessment: Report given to RN, Post -op Vital signs reviewed and stable and Patient moving all extremities  Post vital signs: Reviewed and stable  Last Vitals:  Vitals Value Taken Time  BP    Temp    Pulse 89 11/01/2018  6:57 PM  Resp    SpO2 98 % 11/01/2018  6:57 PM  Vitals shown include unvalidated device data.  Last Pain:  Vitals:   11/01/18 1615  TempSrc: Oral  PainSc:       Patients Stated Pain Goal: 0 (15/52/08 0223)  Complications: No apparent anesthesia complications

## 2018-11-01 NOTE — Progress Notes (Signed)
Subjective:  Patient ID: [name], male    DOB: 28-Apr-1981,  MRN: [id number]  Seen this AM. Does not recall events from yesterday AM, doesn't believe that his sugars got so low.  Objective:   Vitals:   10/31/18 2037 11/01/18 0607  BP: (!) 185/74 (!) 172/102  Pulse: 96 89  Resp: 18 19  Temp: 98.4 F (36.9 C) 98.6 F (37 C)  SpO2: 100% 100%   General AA&O x3. Normal mood and affect.  Vascular Remaining toes appear warm.  Neurologic Epicritic sensation absent.  Dermatologic Wound Right 1st MPJ with fibrogranular base. No purulence. Small area of exposed bone still evident.   Orthopedic: Motor intact.   Results for orders placed or performed during the hospital encounter of 10/26/18 (from the past 24 hour(s))  Glucose, capillary     Status: Abnormal   Collection Time: 10/31/18  7:12 AM  Result Value Ref Range   Glucose-Capillary 130 (H) 70 - 99 mg/dL  Renal function panel     Status: Abnormal   Collection Time: 10/31/18  7:56 AM  Result Value Ref Range   Sodium 135 135 - 145 mmol/L   Potassium 4.8 3.5 - 5.1 mmol/L   Chloride 95 (L) 98 - 111 mmol/L   CO2 25 22 - 32 mmol/L   Glucose, Bld 96 70 - 99 mg/dL   BUN 38 (H) 6 - 20 mg/dL   Creatinine, Ser 9.42 (H) 0.61 - 1.24 mg/dL   Calcium 8.9 8.9 - 10.3 mg/dL   Phosphorus 7.8 (H) 2.5 - 4.6 mg/dL   Albumin 2.4 (L) 3.5 - 5.0 g/dL   GFR calc non Af Amer 6 (L) >60 mL/min   GFR calc Af Amer 7 (L) >60 mL/min   Anion gap 15 5 - 15  CBC     Status: Abnormal   Collection Time: 10/31/18  7:57 AM  Result Value Ref Range   WBC 10.9 (H) 4.0 - 10.5 K/uL   RBC 3.14 (L) 4.22 - 5.81 MIL/uL   Hemoglobin 9.2 (L) 13.0 - 17.0 g/dL   HCT 30.8 (L) 39.0 - 52.0 %   MCV 98.1 80.0 - 100.0 fL   MCH 29.3 26.0 - 34.0 pg   MCHC 29.9 (L) 30.0 - 36.0 g/dL   RDW 14.9 11.5 - 15.5 %   Platelets 295 150 - 400 K/uL   nRBC 0.0 0.0 - 0.2 %  Glucose, capillary     Status: None   Collection Time: 10/31/18  8:00 AM  Result Value Ref Range   Glucose-Capillary 88 70 - 99 mg/dL  Glucose, capillary     Status: Abnormal   Collection Time: 10/31/18 10:53 AM  Result Value Ref Range   Glucose-Capillary 120 (H) 70 - 99 mg/dL  Glucose, capillary     Status: Abnormal   Collection Time: 10/31/18 12:31 PM  Result Value Ref Range   Glucose-Capillary 117 (H) 70 - 99 mg/dL  Glucose, capillary     Status: Abnormal   Collection Time: 10/31/18  3:58 PM  Result Value Ref Range   Glucose-Capillary 126 (H) 70 - 99 mg/dL  Glucose, capillary     Status: Abnormal   Collection Time: 10/31/18  8:47 PM  Result Value Ref Range   Glucose-Capillary 170 (H) 70 - 99 mg/dL    Results for orders placed or performed during the hospital encounter of 10/26/18  Blood culture (routine x 2)     Status: None   Collection Time: 10/26/18  5:20 PM  Result Value Ref Range Status   Specimen Description BLOOD RIGHT ANTECUBITAL  Final   Special Requests   Final    BOTTLES DRAWN AEROBIC AND ANAEROBIC Blood Culture adequate volume   Culture   Final    NO GROWTH 5 DAYS Performed at [facility], [address] [place], [place] [address]    Report Status 10/31/2018 FINAL  Final  Blood culture (routine x 2)     Status: None   Collection Time: 10/26/18  5:25 PM  Result Value Ref Range Status   Specimen Description BLOOD RIGHT FOREARM  Final   Special Requests   Final    BOTTLES DRAWN AEROBIC AND ANAEROBIC Blood Culture adequate volume   Culture   Final    NO GROWTH 5 DAYS Performed at [facility], [place] [address] [place], [place] [address]    Report Status 10/31/2018 FINAL  Final  MRSA PCR Screening     Status: None   Collection Time: 10/27/18  4:11 AM  Result Value Ref Range Status   MRSA by PCR NEGATIVE NEGATIVE Final    Comment:        The GeneXpert MRSA Assay (FDA approved for NASAL specimens only), is one component of a comprehensive MRSA colonization surveillance program. It is not intended to diagnose MRSA infection nor to guide  or monitor treatment for MRSA infections. Performed at [facility], [place] [address] [place], [place] [address]   Aerobic/Anaerobic Culture (surgical/deep wound)     Status: None (Preliminary result)   Collection Time: 10/28/18  3:05 PM  Result Value Ref Range Status   Specimen Description TISSUE TOE LEFT  Final   Special Requests NONE  Final   Gram Stain   Final    NO WBC SEEN NO ORGANISMS SEEN Performed at [name], [address] [place], [place] [address]    Culture   Final    RARE STAPHYLOCOCCUS AUREUS NO ANAEROBES ISOLATED; CULTURE IN PROGRESS FOR 5 DAYS    Report Status PENDING  Incomplete   Organism ID, Bacteria STAPHYLOCOCCUS AUREUS  Final      Susceptibility   Staphylococcus aureus - MIC*    CIPROFLOXACIN <=0.5 SENSITIVE Sensitive     ERYTHROMYCIN <=0.25 SENSITIVE Sensitive     GENTAMICIN <=0.5 SENSITIVE Sensitive     OXACILLIN <=0.25 SENSITIVE Sensitive     TETRACYCLINE <=1 SENSITIVE Sensitive     VANCOMYCIN <=0.5 SENSITIVE Sensitive     TRIMETH/SULFA <=10 SENSITIVE Sensitive     CLINDAMYCIN <=0.25 SENSITIVE Sensitive     RIFAMPIN <=0.5 SENSITIVE Sensitive     Inducible Clindamycin NEGATIVE Sensitive     * RARE STAPHYLOCOCCUS AUREUS    Assessment & Plan:  Patient was evaluated and treated and all questions answered.  Gangrene L Toe, Wound Left foot s/p amputation and debridement, application of VAC - Micro pending.  Pan-sensitive staph - Path reviewed - necrosis of the toe noted. - OR later today for debridement, possible skin graft substitute, wound VAC application. - NPO as of 0800.     Will continue to follow.

## 2018-11-01 NOTE — Progress Notes (Addendum)
Dr. March Rummage spoke to patient at bedside. Patient in agreement to wording on consent. Consent signed by patient and witnessed by this RN. Verified armband matches patient's stated name and birth date. Verified NPO status and that all jewelry, contact, glasses, dentures, and partials had been removed (if applicable). Patient has cell phone and video game at bedside.   Attempted to obtain blood for I- Stat without success. Unable to draw back blood from IV. Notified charge CRNA and Dr. Roanna Banning.

## 2018-11-01 NOTE — Anesthesia Procedure Notes (Signed)
Procedure Name: LMA Insertion Date/Time: 11/01/2018 5:59 PM Performed by: Moshe Salisbury, CRNA Pre-anesthesia Checklist: Patient identified, Emergency Drugs available, Suction available and Patient being monitored Patient Re-evaluated:Patient Re-evaluated prior to induction Oxygen Delivery Method: Circle System Utilized Preoxygenation: Pre-oxygenation with 100% oxygen Induction Type: IV induction Ventilation: Mask ventilation without difficulty LMA: LMA inserted LMA Size: 5.0 Number of attempts: 1 Placement Confirmation: positive ETCO2 Tube secured with: Tape Dental Injury: Teeth and Oropharynx as per pre-operative assessment

## 2018-11-01 NOTE — Anesthesia Preprocedure Evaluation (Signed)
Anesthesia Evaluation  Patient identified by MRN, date of birth, ID band Patient awake    Reviewed: Allergy & Precautions, H&P , NPO status , Patient's Chart, lab work & pertinent test results  Airway Mallampati: II  TM Distance: >3 FB Neck ROM: Full    Dental no notable dental hx. (+) Teeth Intact, Dental Advisory Given   Pulmonary neg pulmonary ROS,    Pulmonary exam normal breath sounds clear to auscultation       Cardiovascular hypertension,  Rhythm:Regular Rate:Normal     Neuro/Psych  Neuromuscular disease negative psych ROS   GI/Hepatic negative GI ROS, Neg liver ROS,   Endo/Other  diabetes, Type 1, Insulin Dependent  Renal/GU Dialysis and ESRFRenal disease  negative genitourinary   Musculoskeletal  (+) Arthritis ,   Abdominal   Peds  Hematology  (+) Blood dyscrasia, anemia ,   Anesthesia Other Findings   Reproductive/Obstetrics negative OB ROS                             Anesthesia Physical  Anesthesia Plan  ASA: III  Anesthesia Plan: General   Post-op Pain Management:    Induction: Intravenous  PONV Risk Score and Plan: 3 and Ondansetron, Midazolam and Treatment may vary due to age or medical condition  Airway Management Planned: LMA  Additional Equipment:   Intra-op Plan:   Post-operative Plan: Extubation in OR  Informed Consent: I have reviewed the patients History and Physical, chart, labs and discussed the procedure including the risks, benefits and alternatives for the proposed anesthesia with the patient or authorized representative who has indicated his/her understanding and acceptance.     Dental advisory given  Plan Discussed with: CRNA, Anesthesiologist and Surgeon  Anesthesia Plan Comments:         Anesthesia Quick Evaluation

## 2018-11-02 ENCOUNTER — Encounter (HOSPITAL_COMMUNITY): Payer: Self-pay | Admitting: Podiatry

## 2018-11-02 DIAGNOSIS — E1349 Other specified diabetes mellitus with other diabetic neurological complication: Secondary | ICD-10-CM

## 2018-11-02 DIAGNOSIS — E1365 Other specified diabetes mellitus with hyperglycemia: Secondary | ICD-10-CM

## 2018-11-02 LAB — AEROBIC/ANAEROBIC CULTURE W GRAM STAIN (SURGICAL/DEEP WOUND): Gram Stain: NONE SEEN

## 2018-11-02 LAB — RENAL FUNCTION PANEL
Albumin: 2.5 g/dL — ABNORMAL LOW (ref 3.5–5.0)
Anion gap: 13 (ref 5–15)
BUN: 38 mg/dL — ABNORMAL HIGH (ref 6–20)
CO2: 22 mmol/L (ref 22–32)
Calcium: 9.3 mg/dL (ref 8.9–10.3)
Chloride: 97 mmol/L — ABNORMAL LOW (ref 98–111)
Creatinine, Ser: 10.34 mg/dL — ABNORMAL HIGH (ref 0.61–1.24)
GFR calc Af Amer: 6 mL/min — ABNORMAL LOW (ref >60)
GFR calc non Af Amer: 6 mL/min — ABNORMAL LOW (ref >60)
Glucose, Bld: 195 mg/dL — ABNORMAL HIGH (ref 70–99)
Phosphorus: 7.6 mg/dL — ABNORMAL HIGH (ref 2.5–4.6)
Potassium: 6.5 mmol/L (ref 3.5–5.1)
Sodium: 132 mmol/L — ABNORMAL LOW (ref 135–145)

## 2018-11-02 LAB — CBC
HCT: 28 % — ABNORMAL LOW (ref 39.0–52.0)
Hemoglobin: 8.3 g/dL — ABNORMAL LOW (ref 13.0–17.0)
MCH: 29 pg (ref 26.0–34.0)
MCHC: 29.6 g/dL — ABNORMAL LOW (ref 30.0–36.0)
MCV: 97.9 fL (ref 80.0–100.0)
Platelets: 281 10*3/uL (ref 150–400)
RBC: 2.86 MIL/uL — ABNORMAL LOW (ref 4.22–5.81)
RDW: 15.2 % (ref 11.5–15.5)
WBC: 10.7 10*3/uL — ABNORMAL HIGH (ref 4.0–10.5)
nRBC: 0 % (ref 0.0–0.2)

## 2018-11-02 LAB — GLUCOSE, CAPILLARY
Glucose-Capillary: 203 mg/dL — ABNORMAL HIGH (ref 70–99)
Glucose-Capillary: 150 mg/dL — ABNORMAL HIGH (ref 70–99)
Glucose-Capillary: 138 mg/dL — ABNORMAL HIGH (ref 70–99)
Glucose-Capillary: 380 mg/dL — ABNORMAL HIGH (ref 70–99)

## 2018-11-02 MED ORDER — CALCITRIOL 0.5 MCG PO CAPS
ORAL_CAPSULE | ORAL | Status: AC
  Filled 2018-11-02: qty 1

## 2018-11-02 MED ORDER — CALCITRIOL 0.25 MCG PO CAPS
ORAL_CAPSULE | ORAL | Status: AC
  Filled 2018-11-02: qty 1

## 2018-11-02 MED ORDER — SODIUM CHLORIDE 0.9 % IV SOLN
2.0000 g | INTRAVENOUS | Status: DC
  Administered 2018-11-02: 2 g via INTRAVENOUS
  Filled 2018-11-02 (×2): qty 2

## 2018-11-02 MED ORDER — CALCITRIOL 0.5 MCG PO CAPS
ORAL_CAPSULE | ORAL | Status: AC
  Filled 2018-11-02: qty 4

## 2018-11-02 NOTE — Care Management Important Message (Signed)
Important Message  Patient Details  Name: [name] MRN: [id number] Date of Birth: May 20, 1981   Medicare Important Message Given:  Yes    [name] 11/02/2018, 2:50 PM

## 2018-11-02 NOTE — Progress Notes (Signed)
Subjective:  Patient ID: [name], male    DOB: December 19, 1980,  MRN: [id number]  Seen bedside. Had issues with the Wound VAC leaking this AM. Objective:   Vitals:   11/02/18 1614 11/02/18 1706  BP: (!) 143/70 (!) 133/59  Pulse: 79 95  Resp: 16 (!) 22  Temp: 98 F (36.7 C) 98.1 F (36.7 C)  SpO2: 100% 100%   General AA&O x3. Normal mood and affect.  Vascular Remaining toes appear warm.  Neurologic Epicritic sensation absent.  Dermatologic Wound VAC set to wall suction. Cannister empty. Wound bed healing well. Wound graft intact but dry, wound bed free from signs of acute infection. No ascending warmth or erythema.  Orthopedic: Motor intact.   Results for orders placed or performed during the hospital encounter of 10/26/18 (from the past 24 hour(s))  Glucose, capillary     Status: Abnormal   Collection Time: 11/01/18  6:57 PM  Result Value Ref Range   Glucose-Capillary 138 (H) 70 - 99 mg/dL   Comment 1 Notify RN    Comment 2 Document in Chart   Glucose, capillary     Status: Abnormal   Collection Time: 11/01/18  9:51 PM  Result Value Ref Range   Glucose-Capillary 164 (H) 70 - 99 mg/dL  Glucose, capillary     Status: Abnormal   Collection Time: 11/01/18 11:28 PM  Result Value Ref Range   Glucose-Capillary 199 (H) 70 - 99 mg/dL  Glucose, capillary     Status: Abnormal   Collection Time: 11/02/18  4:21 AM  Result Value Ref Range   Glucose-Capillary 203 (H) 70 - 99 mg/dL  Glucose, capillary     Status: Abnormal   Collection Time: 11/02/18  7:29 AM  Result Value Ref Range   Glucose-Capillary 150 (H) 70 - 99 mg/dL  Glucose, capillary     Status: Abnormal   Collection Time: 11/02/18  5:02 PM  Result Value Ref Range   Glucose-Capillary 138 (H) 70 - 99 mg/dL  CBC     Status: Abnormal   Collection Time: 11/02/18  5:03 PM  Result Value Ref Range   WBC 10.7 (H) 4.0 - 10.5 K/uL   RBC 2.86 (L) 4.22 - 5.81 MIL/uL   Hemoglobin 8.3 (L) 13.0 - 17.0 g/dL   HCT 28.0 (L) 39.0 -  52.0 %   MCV 97.9 80.0 - 100.0 fL   MCH 29.0 26.0 - 34.0 pg   MCHC 29.6 (L) 30.0 - 36.0 g/dL   RDW 15.2 11.5 - 15.5 %   Platelets 281 150 - 400 K/uL   nRBC 0.0 0.0 - 0.2 %  Renal function panel     Status: Abnormal   Collection Time: 11/02/18  5:36 PM  Result Value Ref Range   Sodium 132 (L) 135 - 145 mmol/L   Potassium 6.5 (HH) 3.5 - 5.1 mmol/L   Chloride 97 (L) 98 - 111 mmol/L   CO2 22 22 - 32 mmol/L   Glucose, Bld 195 (H) 70 - 99 mg/dL   BUN 38 (H) 6 - 20 mg/dL   Creatinine, Ser 10.34 (H) 0.61 - 1.24 mg/dL   Calcium 9.3 8.9 - 10.3 mg/dL   Phosphorus 7.6 (H) 2.5 - 4.6 mg/dL   Albumin 2.5 (L) 3.5 - 5.0 g/dL   GFR calc non Af Amer 6 (L) >60 mL/min   GFR calc Af Amer 6 (L) >60 mL/min   Anion gap 13 5 - 15    Results for orders placed or performed  during the hospital encounter of 10/26/18  Blood culture (routine x 2)     Status: None   Collection Time: 10/26/18  5:20 PM  Result Value Ref Range Status   Specimen Description BLOOD RIGHT ANTECUBITAL  Final   Special Requests   Final    BOTTLES DRAWN AEROBIC AND ANAEROBIC Blood Culture adequate volume   Culture   Final    NO GROWTH 5 DAYS Performed at [facility], [address] [place], [place] [address]    Report Status 10/31/2018 FINAL  Final  Blood culture (routine x 2)     Status: None   Collection Time: 10/26/18  5:25 PM  Result Value Ref Range Status   Specimen Description BLOOD RIGHT FOREARM  Final   Special Requests   Final    BOTTLES DRAWN AEROBIC AND ANAEROBIC Blood Culture adequate volume   Culture   Final    NO GROWTH 5 DAYS Performed at [facility], [place] [address], [place], [place] [address]    Report Status 10/31/2018 FINAL  Final  MRSA PCR Screening     Status: None   Collection Time: 10/27/18  4:11 AM  Result Value Ref Range Status   MRSA by PCR NEGATIVE NEGATIVE Final    Comment:        The GeneXpert MRSA Assay (FDA approved for NASAL specimens only), is one component of  a comprehensive MRSA colonization surveillance program. It is not intended to diagnose MRSA infection nor to guide or monitor treatment for MRSA infections. Performed at [facility], [place] [address], [place], [place] [address]   Aerobic/Anaerobic Culture (surgical/deep wound)     Status: None   Collection Time: 10/28/18  3:05 PM  Result Value Ref Range Status   Specimen Description TISSUE TOE LEFT  Final   Special Requests NONE  Final   Gram Stain NO WBC SEEN NO ORGANISMS SEEN   Final   Culture   Final    RARE STAPHYLOCOCCUS AUREUS NO ANAEROBES ISOLATED Performed at [facility], [place] [address], [place],  [address]    Report Status 11/02/2018 FINAL  Final   Organism ID, Bacteria STAPHYLOCOCCUS AUREUS  Final      Susceptibility   Staphylococcus aureus - MIC*    CIPROFLOXACIN <=0.5 SENSITIVE Sensitive     ERYTHROMYCIN <=0.25 SENSITIVE Sensitive     GENTAMICIN <=0.5 SENSITIVE Sensitive     OXACILLIN <=0.25 SENSITIVE Sensitive     TETRACYCLINE <=1 SENSITIVE Sensitive     VANCOMYCIN <=0.5 SENSITIVE Sensitive     TRIMETH/SULFA <=10 SENSITIVE Sensitive     CLINDAMYCIN <=0.25 SENSITIVE Sensitive     RIFAMPIN <=0.5 SENSITIVE Sensitive     Inducible Clindamycin NEGATIVE Sensitive     * RARE STAPHYLOCOCCUS AUREUS    Assessment & Plan:  Patient was evaluated and treated and all questions answered.  Gangrene L Toe, Wound Left foot s/p amputation and debridement, application of VAC - Wound macerated. Will provide wound VAC break today. - Betadine painted around wound for maceration. Hydrogel applied to wound graft to moisten. - Ok for discharge from podiatry standpoint. Can reapply Wound VAC in the office. I did advise patient that if his family brings his home [facility] to the hospital I can reapply and set it to the home [facility] prior to discharge, if wound condition indicates. - WBAT in surgical shoe only left foot. - Abx for 2 weeks at discharge. Tressie Ellis with HD MWF. -  Leave dressing  intact left foot. No dressing change by nursing staff.  Podiatry to sign off. Please contact with questions or concerns.

## 2018-11-02 NOTE — Progress Notes (Signed)
PROGRESS NOTE  [name] YCX:[id number] DOB: 21-Nov-1980 DOA: 10/26/2018 PCP: Patient, No Pcp Per  HPI/Recap of past 24 hours:  Patient is seen after returned from dialysis No fever,  Wound vac removed by podiatry  Assessment/Plan: Principal Problem:   Cellulitis Active Problems:   ESRD (end stage renal disease) on dialysis ([place])   Cellulitis and abscess of toe of left foot   Diabetic foot infection ([place])   Anemia of chronic disease   Osteomyelitis of foot, left, acute ([place])   DM (diabetes mellitus), secondary, uncontrolled, with neurologic complications ([place])   Diabetic ulcer of left midfoot associated with diabetes mellitus due to underlying condition, with bone involvement without evidence of necrosis ([place])   Foot infection   Gangrene of toe of left foot ([place])   Open wound of left foot   Encounter for orthopedic aftercare following surgical amputation   Nonhealing left foot wound/diabetic foot ulcer/eft foot osteomyelitis - pe vascular sugery Dr Donzetta Matters "he has palpable pulses he does not need any revascularization at this time"  "I attempted to discuss with him that he likely needs at least transmetatarsal amputation possible below-knee amputation but he is not interested in this.  He was no part of discussing amputation at this time in hopes that he can take a pill for this problem." detail please refer to Dr Claretha Cooper consult note from 1/30 -Status post surgical intervention on 10/28/2018 with a left great toe amputation with I&D and repeat I/D on 2/4 with wound vac application by podiatry Dr price -On wound vac break today due to wound is macerated -blood culture no growth, mrsa screening, surgical wound + MSSA, he received unasyn in the hospital -case discussed with podiatry Dr March Rummage who recommend fortaz through dialysis for two more weeks - Plan per podiatry, Podiatry Dr price input appreciated  ESRD on HD Not making urine anymore Plan per nephrology Nephrology input  appreciated  Anemia of chronic disease - Further management per renal.  Insulin dependent DM2 a1c in 09/2017 was 6.7   Code Status: full  Family Communication: patient   Disposition Plan: home tomorrow with podiatry clearance and nephrology clearance   Consultants:  podiatry  nephrology  Procedures:  Status post surgical intervention on 10/28/2018 with a left great toe amputation with I&D and repeat I/D on 2/4 with wound vac application by podiatry Dr price  Antibiotics:  unasyn then fortaz through HD   Objective: BP (!) 133/59 (BP Location: Right Wrist)   Pulse 95   Temp 98.1 F (36.7 C) (Oral)   Resp (!) 22   Ht 5\' 6"  (1.676 m)   Wt 76.4 kg Comment: standing weight  SpO2 100%   BMI 27.19 kg/m   Intake/Output Summary (Last 24 hours) at 11/02/2018 1905 Last data filed at 11/02/2018 1614 Gross per 24 hour  Intake 685 ml  Output 7000 ml  Net -6315 ml   Filed Weights   11/02/18 0209 11/02/18 1121 11/02/18 1614  Weight: 83.7 kg 84.4 kg 76.4 kg    Exam: Patient is examined daily including today on 11/02/2018, exams remain the same as of yesterday except that has changed    General:  NAD  Cardiovascular: RRR  Respiratory: CTABL  Abdomen: Soft/ND/NT, positive BS  Musculoskeletal: left foot post op changes  Neuro: alert, oriented   Data Reviewed: Basic Metabolic Panel: Recent Labs  Lab 10/27/18 0426 10/28/18 0800 10/31/18 0756 11/02/18 1736  NA 132* 132* 135 132*  K 4.1 4.8 4.8 6.5*  CL  91* 94* 95* 97*  CO2 29 26 25 22   GLUCOSE 405* 211* 96 195*  BUN 20 33* 38* 38*  CREATININE 7.01* 9.01* 9.42* 10.34*  CALCIUM 9.1 9.2 8.9 9.3  PHOS  --  5.9* 7.8* 7.6*   Liver Function Tests: Recent Labs  Lab 10/28/18 0800 10/31/18 0756 11/02/18 1736  ALBUMIN 2.1* 2.4* 2.5*   No results for input(s): LIPASE, AMYLASE in the last 168 hours. No results for input(s): AMMONIA in the last 168 hours. CBC: Recent Labs  Lab 10/27/18 0426 10/31/18 0757  11/02/18 1703  WBC 9.6 10.9* 10.7*  HGB 9.4* 9.2* 8.3*  HCT 30.8* 30.8* 28.0*  MCV 96.6 98.1 97.9  PLT 212 295 281   Cardiac Enzymes:   No results for input(s): CKTOTAL, CKMB, CKMBINDEX, TROPONINI in the last 168 hours. BNP (last 3 results) No results for input(s): BNP in the last 8760 hours.  ProBNP (last 3 results) No results for input(s): PROBNP in the last 8760 hours.  CBG: Recent Labs  Lab 11/01/18 2151 11/01/18 2328 11/02/18 0421 11/02/18 0729 11/02/18 1702  GLUCAP 164* 199* 203* 150* 138*    Recent Results (from the past 240 hour(s))  Blood culture (routine x 2)     Status: None   Collection Time: 10/26/18  5:20 PM  Result Value Ref Range Status   Specimen Description BLOOD RIGHT ANTECUBITAL  Final   Special Requests   Final    BOTTLES DRAWN AEROBIC AND ANAEROBIC Blood Culture adequate volume   Culture   Final    NO GROWTH 5 DAYS Performed at [facility], [place] [address] [place], [place] [address]    Report Status 10/31/2018 FINAL  Final  Blood culture (routine x 2)     Status: None   Collection Time: 10/26/18  5:25 PM  Result Value Ref Range Status   Specimen Description BLOOD RIGHT FOREARM  Final   Special Requests   Final    BOTTLES DRAWN AEROBIC AND ANAEROBIC Blood Culture adequate volume   Culture   Final    NO GROWTH 5 DAYS Performed at [facility], [place] [address], [place], [place] [address]    Report Status 10/31/2018 FINAL  Final  MRSA PCR Screening     Status: None   Collection Time: 10/27/18  4:11 AM  Result Value Ref Range Status   MRSA by PCR NEGATIVE NEGATIVE Final    Comment:        The GeneXpert MRSA Assay (FDA approved for NASAL specimens only), is one component of a comprehensive MRSA colonization surveillance program. It is not intended to diagnose MRSA infection nor to guide or monitor treatment for MRSA infections. Performed at [facility], [place] [address], [place], [place] [address]   Aerobic/Anaerobic  Culture (surgical/deep wound)     Status: None   Collection Time: 10/28/18  3:05 PM  Result Value Ref Range Status   Specimen Description TISSUE TOE LEFT  Final   Special Requests NONE  Final   Gram Stain NO WBC SEEN NO ORGANISMS SEEN   Final   Culture   Final    RARE STAPHYLOCOCCUS AUREUS NO ANAEROBES ISOLATED Performed at [facility], [place] [address], [place], [place] [address]    Report Status 11/02/2018 FINAL  Final   Organism ID, Bacteria STAPHYLOCOCCUS AUREUS  Final      Susceptibility   Staphylococcus aureus - MIC*    CIPROFLOXACIN <=0.5 SENSITIVE Sensitive     ERYTHROMYCIN <=  0.25 SENSITIVE Sensitive     GENTAMICIN <=0.5 SENSITIVE Sensitive     OXACILLIN <=0.25 SENSITIVE Sensitive     TETRACYCLINE <=1 SENSITIVE Sensitive     VANCOMYCIN <=0.5 SENSITIVE Sensitive     TRIMETH/SULFA <=10 SENSITIVE Sensitive     CLINDAMYCIN <=0.25 SENSITIVE Sensitive     RIFAMPIN <=0.5 SENSITIVE Sensitive     Inducible Clindamycin NEGATIVE Sensitive     * RARE STAPHYLOCOCCUS AUREUS     Studies: No results found.  Scheduled Meds: . calcitRIOL  2.5 mcg Oral Q M,W,F-HD  . cinacalcet  30 mg Oral Q supper  . darbepoetin (ARANESP) injection - DIALYSIS  100 mcg Intravenous Q Fri-HD  . insulin aspart  0-5 Units Subcutaneous QHS  . insulin aspart  0-9 Units Subcutaneous TID WC  . insulin glargine  5 Units Subcutaneous BID  . multivitamin  1 tablet Oral QHS  . sevelamer carbonate  2,400 mg Oral TID WC  . sodium chloride flush  3 mL Intravenous Q12H    Continuous Infusions: . sodium chloride    . sodium chloride 10 mL/hr at 10/28/18 1408  . cefTAZidime (FORTAZ)  IV Stopped (11/02/18 1647)  . ferric gluconate (FERRLECIT/NULECIT) IV 62.5 mg (10/28/18 1128)     Time spent: 3mins, case discussed with podiatry and nephrology I have personally reviewed and interpreted on  11/02/2018 daily labs, imagings as discussed above under date review session and assessment and plans.  I reviewed  all nursing notes, pharmacy notes, consultant notes,  vitals, pertinent old records  I have discussed plan of care as described above with RN , patient  on 11/02/2018   Florencia Reasons MD, PhD  [facility] Pager [phone]. If 7PM-7AM, please contact night-coverage at [email or web address], password [facility] 11/02/2018, 7:05 PM  LOS: 7 days

## 2018-11-02 NOTE — Progress Notes (Signed)
Pharmacy Antibiotic Note  [name] is a 38 y.o. male admitted on 10/26/2018 with ESRD on HD. He was recently admitted and discharged with a wound vac for a foot infection. The patient presented 10/26/18 with worsening infection.  Abx D#7 for chronic L-foot ulcer/infection/oste. s/p OR 1/23 + 1/26 for I&D last admit. Pt was refusing amp so plan was to  attempt wound vanc with toe salvage for now. Prior cx grew MSSA - abx narrowed for this.  1/31: s/p left great toe amputated  2/4 debridementPer podiatry.   Afb, WBC 10.9 on 2/3 Podiatry wants Ceftazidime post HD x 2 weeks (for discharge)   Plan: Discontinue Unasyn & start Ceftazidime  2 gm IV after QHD -MWF  start today,  plan cont x 2 weeks , as recommended by podiatrist.   -Monitor HD schedule and tolerance   Temp (24hrs), Avg:97.9 F (36.6 C), Min:97.5 F (36.4 C), Max:98.2 F (36.8 C)  Recent Labs  Lab 10/26/18 1716 10/26/18 1822 10/27/18 0426 10/28/18 0800 10/31/18 0756 10/31/18 0757  WBC 13.4*  --  9.6  --   --  10.9*  CREATININE 5.83*  --  7.01* 9.01* 9.42*  --   LATICACIDVEN 2.6* 2.0*  --   --   --   --       Antimicrobials this admission: Doxy PTA  Vanc 1/29 x 1 Cefepime 1/29 x 1 Unasyn 1/30 >>2/5 Ceftazidime 2/5>>   (plan x 2 weeks)   Dose adjustments this admission: N/A  Microbiology results: 1/29 BCx >> negative 1/30 MRSA PCR >> neg 1/31 left toe tissue - rare Staph aureus (s/p left great toe amp 1/31):   MSSA  1/30 MRSA PCR negative   Thank you for allowing pharmacy to be part of this patients care team. Nicole Cella, [place] Pharmacist [phone] Please check AMION for all [place] phone numbers After 10:00 PM, call [place] 11/02/2018 10:27 AM

## 2018-11-02 NOTE — Progress Notes (Signed)
11/02/2018 5:01 PM  Patient informed me his dry weight 75.5kg. Shahed Yeoman MSN, RN-BC, CNML [facility] Phone: [phone]

## 2018-11-02 NOTE — Progress Notes (Signed)
Dr.Price came to 76[facility] to change patients wound vac, however patient is currently in dialysis. Dr.Price plans to return later this evening.

## 2018-11-02 NOTE — Progress Notes (Signed)
Left foot dressing saturated with blood. Minimal. No  Further  bleeding noted. Offered him to change his dressing. "He stated only Dr. March Rummage can only change my dressing." Paged Dr Amalia Hailey on call for Dr March Rummage. I spoke with Dr Amalia Hailey regarding the patient and the patient talked to Dr Amalia Hailey himself.  Dr March Rummage called and gave writer an order. Patient wanted to speak with Dr price but was too late. (He hangup.}Spoke with Dr Amalia Hailey if he called back and told him no.   Explained to patient that he called for orders. Patient refused treatment till he talk to him  or see Dr March Rummage.

## 2018-11-02 NOTE — Progress Notes (Signed)
Critical Lab  Potassium 6.5  MD paged  HD verified lab sample was drawn prior to HD

## 2018-11-02 NOTE — Progress Notes (Signed)
Patient's wound vac was alarming that there was a blockage. No blockage or kinks in tubings noted, all clamps are open. [place] nurse consulted, suspects a blood clot is under foam and advised that surgeon be contacted. Dr.Price was paged, he requested patient be connected to wall suction, 125 mm/Hg and continuous, until he can come and assess wound vac.

## 2018-11-02 NOTE — Progress Notes (Signed)
Refused lab draw twice.

## 2018-11-02 NOTE — Progress Notes (Signed)
Patient was real agitated and become ranting out so much of his complaints when the 2nd person from I.V team was unable to get peripheral access to him.He demanded to me to call somebody else above my position so that he could voice out his complaint and he doesn't believe that the hospital has two I.V persons at this moment.So timely ,house A/C came with securities and talk to this patient.With me and the A/C ,patient verbalized complaint for more than an hour.

## 2018-11-02 NOTE — Progress Notes (Signed)
Subjective:  For hd today,Sp Dr March Rummage L Foot debridement yest.  Multiple cos ,no sob or cp, Ice pitcher in Room full with liquids and multiple cups of fluid at bedside, I stressed diet/volumme  compliance  for foot to heal and Mr [name] states He will drink/eat what he wants  Objective Vital signs in last 24 hours: Vitals:   11/01/18 1936 11/01/18 2153 11/02/18 0209 11/02/18 0423  BP: (!) 176/76 117/75  (!) 145/80  Pulse: 84 92  85  Resp: 11   18  Temp: (!) 97.5 F (36.4 C) 98.1 F (36.7 C)  98.2 F (36.8 C)  TempSrc:  Oral  Oral  SpO2: 95% 100%  (!) 54%  Weight:   83.7 kg   Height:       Weight change: -0.511 kg  Physical Exam: General:Alert Anxious AAM but not NAD Heart:RRR; no murmur Lungs:CTAB Abdomen:soft, non-tender Extremities:L foot bandaged with wound vac, Trace bipedal  edema Dialysis Access:L AVGpos bruit   Dialysis Orders: [facility] MWF 4 hr EDW 75.5 kg heparin 8000 2 K 2 Ca left upper AVGG calcitriol 2.5 venofer 100 x 7 on hold due to recent infection. hgb at d/c 11.2 no ESA at present - last Mircera was 225 12/10 and prior to that 75  Problem/Plan: 1.Leftfoot osteo s/p two I&D: S/p L great toe amputation 1/31- wound vac in place.SP 02/04 debridement  Per podiatry. Antbx per admit Dr March Rummage  2. ESRD: Continue HD per MWF schedule - hd on schedule  3. Anemia: Hgb 9.2- continue Fridayweekly Aranesp100 4. Secondary hyperparathyroidism: Ca/Phos5.9>7.8 On sevelamer 2400mg ,Increase to 3200g acsensipar, calcitriol /REFUSING RENAL CARB  MOD diet= makes it difficult to control phos  5.HTN/volume: BP ok,last HD 02/03 Post wt Wt 81.2 kg   (EDW 75.5) / keeping same EDW for now.Pt needs to be responsible for his diet/ uf  5 liter attempt as bp allows today  6. Nutrition: Alb2.4<2.2continue ^ protein diet. 7. DM: Per primary 8. Noncompliance with  DM /ESRD diet/volume- pt tells me he will do what he wants to do  [name], PA-C [facility] Beeper [phone] 11/02/2018,8:15 AM  LOS: 7 days   Labs: Basic Metabolic Panel: Recent Labs  Lab 10/27/18 0426 10/28/18 0800 10/31/18 0756  NA 132* 132* 135  K 4.1 4.8 4.8  CL 91* 94* 95*  CO2 29 26 25   GLUCOSE 405* 211* 96  BUN 20 33* 38*  CREATININE 7.01* 9.01* 9.42*  CALCIUM 9.1 9.2 8.9  PHOS  --  5.9* 7.8*   Liver Function Tests: Recent Labs  Lab 10/26/18 1716 10/28/18 0800 10/31/18 0756  AST 23  --   --   ALT 5  --   --   ALKPHOS 112  --   --   BILITOT 0.7  --   --   PROT 7.3  --   --   ALBUMIN 2.5* 2.1* 2.4*   No results for input(s): LIPASE, AMYLASE in the last 168 hours. No results for input(s): AMMONIA in the last 168 hours. CBC: Recent Labs  Lab 10/26/18 1716 10/27/18 0426 10/31/18 0757  WBC 13.4* 9.6 10.9*  NEUTROABS 11.0*  --   --   HGB 10.0* 9.4* 9.2*  HCT 33.3* 30.8* 30.8*  MCV 96.8 96.6 98.1  PLT 182 212 295   Cardiac Enzymes: No results for input(s): CKTOTAL, CKMB, CKMBINDEX, TROPONINI in the last 168 hours. CBG: Recent Labs  Lab 11/01/18 1857 11/01/18 2151 11/01/18 2328 11/02/18 0421 11/02/18 8101  GLUCAP 138* 164* 199* 203* 150*    Studies/Results: No results found. Medications: . sodium chloride    . sodium chloride 10 mL/hr at 10/28/18 1408  . ampicillin-sulbactam (UNASYN) IV 1.5 g (11/01/18 2108)  . ferric gluconate (FERRLECIT/NULECIT) IV 62.5 mg (10/28/18 1128)   . calcitRIOL  2.5 mcg Oral Q M,W,F-HD  . cinacalcet  30 mg Oral Q supper  . darbepoetin (ARANESP) injection - DIALYSIS  100 mcg Intravenous Q Fri-HD  . insulin aspart  0-5 Units Subcutaneous QHS  . insulin aspart  0-9 Units Subcutaneous TID WC  . insulin glargine  5 Units Subcutaneous BID  . multivitamin  1 tablet Oral QHS  . sevelamer carbonate  2,400 mg Oral TID WC  . sodium chloride flush  3 mL Intravenous Q12H

## 2018-11-03 ENCOUNTER — Telehealth: Payer: Self-pay | Admitting: Podiatry

## 2018-11-03 ENCOUNTER — Encounter: Payer: Medicare Other | Admitting: Podiatry

## 2018-11-03 ENCOUNTER — Other Ambulatory Visit: Payer: Self-pay | Admitting: Podiatry

## 2018-11-03 ENCOUNTER — Ambulatory Visit (INDEPENDENT_AMBULATORY_CARE_PROVIDER_SITE_OTHER): Payer: Medicare Other | Admitting: Podiatry

## 2018-11-03 DIAGNOSIS — E119 Type 2 diabetes mellitus without complications: Secondary | ICD-10-CM

## 2018-11-03 DIAGNOSIS — E08621 Diabetes mellitus due to underlying condition with foot ulcer: Secondary | ICD-10-CM | POA: Diagnosis not present

## 2018-11-03 DIAGNOSIS — Z794 Long term (current) use of insulin: Secondary | ICD-10-CM

## 2018-11-03 DIAGNOSIS — L97423 Non-pressure chronic ulcer of left heel and midfoot with necrosis of muscle: Secondary | ICD-10-CM | POA: Diagnosis not present

## 2018-11-03 LAB — GLUCOSE, CAPILLARY
Glucose-Capillary: 300 mg/dL — ABNORMAL HIGH (ref 70–99)
Glucose-Capillary: 173 mg/dL — ABNORMAL HIGH (ref 70–99)
Glucose-Capillary: 227 mg/dL — ABNORMAL HIGH (ref 70–99)

## 2018-11-03 MED ORDER — SODIUM CHLORIDE 0.9 % IV SOLN: 2.0000 g | INTRAVENOUS | Status: DC

## 2018-11-03 MED ORDER — CEFAZOLIN SODIUM-DEXTROSE 2-4 GM/100ML-% IV SOLN: 2.0000 g | INTRAVENOUS | Status: DC

## 2018-11-03 MED ORDER — CEFAZOLIN SODIUM-DEXTROSE 2-4 GM/100ML-% IV SOLN: 2.0000 g | INTRAVENOUS | 7 refills | Status: DC

## 2018-11-03 MED ORDER — HYDROMORPHONE HCL 2 MG PO TABS: 1.0000 mg | ORAL_TABLET | Freq: Four times a day (QID) | ORAL | 0 refills | Status: DC | PRN

## 2018-11-03 NOTE — Anesthesia Postprocedure Evaluation (Signed)
Anesthesia Post Note  Patient: [name]  Procedure(s) Performed: IRRIGATION AND DEBRIDEMENT PARTIAL WOUND CLOSURE LOCAL TISSUE TRANSFER AND FLAP ROTATION, LEFT FOOT (Left ) APPLICATION OF WOUND VAC (Left )     Patient location during evaluation: PACU Anesthesia Type: General Level of consciousness: awake and alert Pain management: pain level controlled Vital Signs Assessment: post-procedure vital signs reviewed and stable Respiratory status: spontaneous breathing, nonlabored ventilation, respiratory function stable and patient connected to nasal cannula oxygen Cardiovascular status: blood pressure returned to baseline and stable Postop Assessment: no apparent nausea or vomiting Anesthetic complications: no    Last Vitals:  Vitals:   11/03/18 0713 11/03/18 0928  BP: (!) 173/88 (!) 216/124  Pulse: 91 91  Resp: 20 18  Temp: 36.6 C   SpO2: 99% 95%    Last Pain:  Vitals:   11/03/18 1005  TempSrc:   PainSc: 8    Pain Goal: Patients Stated Pain Goal: 2 (11/03/18 1005)                 Danessa Mensch S

## 2018-11-03 NOTE — Telephone Encounter (Signed)
I called [facility] and informed that Dr. March Rummage had said he had put orders in Orders Only and she stated she had copied what he had put in and pasted under [facility] orders and Dr. March Rummage would need to sign. I told Nira Conn I would get him to sign.

## 2018-11-03 NOTE — Telephone Encounter (Signed)
I put orders in. Can you call and inform?

## 2018-11-03 NOTE — Progress Notes (Signed)
Rx sent to pharmacy at patient's request. This will be his only Rx.

## 2018-11-03 NOTE — Progress Notes (Signed)
Patient refused lab this am. Will take vitals later as well.

## 2018-11-03 NOTE — Progress Notes (Addendum)
Subjective:  HD yesterday tolerated 7.0 liters, this am initial;lyu would not let me exam him,cursing language to me with discussion about trying to care for his HD txs. Noted refusing lab draw.  Objective Vital signs in last 24 hours: Vitals:   11/02/18 1614 11/02/18 1706 11/02/18 2114 11/03/18 0713  BP: (!) 143/70 (!) 133/59 (!) 151/138 (!) 173/88  Pulse: 79 95 83 91  Resp: 16 (!) 22 20 20   Temp: 98 F (36.7 C) 98.1 F (36.7 C) 98.1 F (36.7 C) 97.8 F (36.6 C)  TempSrc: Oral Oral Oral Oral  SpO2: 100% 100%  99%  Weight: 76.4 kg     Height:       Weight change: 0.711 kg  Physical Exam: General:alert ,NAD but pt.  vulgar /cursing language when trying to discuss his ESRD care  Heart:RRR; no murmur Lungs:CTAB Abdomen:soft, non-tender Extremities:L foot bandaged , no RLE edema Dialysis Access:L AVGpos bruit   Dialysis Orders: [facility] MWF 4 hr EDW 75.5 kg heparin 8000 2 K 2 Ca left upper AVGG calcitriol 2.5 venofer 100 x 7 on hold due to recent infection. hgb at d/c 11.2 no ESA at present - last Mircera was 225 12/10 and prior to that 75  Problem/Plan: 1.Leftfoot osteo s/p two I&D: S/p L great toe amputation 1/31- . Plans Per podiatry. Noted will give Fortaz 2 gm end of OP HD when dc /Unasyn.Prelim wound Cx + staph aureus.  2. ESRD: k 6.5 yest.  before hd used 2.0 k bath ,noted he refuses lab draw today Continue HD per MWF schedule - hd tomor on schedule ck labs pre hd  3. Anemia: Hgb 9.2- 8.3 prob dilutional as drawn pre hd and 7 liter uf  With hd continue Fridayweekly Aranesp100 4. Secondary hyperparathyroidism: Ca 9.3 /Phos5.9>7.8>7.6 (not on renal diet he refuses hard to control when pt noncompliant ) On sevelamer   acsensipar, calcitriol 5.HTN/volume: BP sl up this am (pt very anxious / agitated ) ,tolerated 7 l uf yest  /  Post wt Wt 76.2  kg 02/03 hd   (EDW 75.5) / keeping same EDW for now.Pt needs to be responsible for his diet/ uf  To edw  attempt as  bp allows 02/07 if he is here  6. Nutrition: Alb2.4<2.5 7. DM: Per primary, noted he  Refused dm diet per RN     [name], PA-C [facility] Beeper [phone] 11/03/2018,8:33 AM  LOS: 8 days   Labs: Basic Metabolic Panel: Recent Labs  Lab 10/28/18 0800 10/31/18 0756 11/02/18 1736  NA 132* 135 132*  K 4.8 4.8 6.5*  CL 94* 95* 97*  CO2 26 25 22   GLUCOSE 211* 96 195*  BUN 33* 38* 38*  CREATININE 9.01* 9.42* 10.34*  CALCIUM 9.2 8.9 9.3  PHOS 5.9* 7.8* 7.6*   Liver Function Tests: Recent Labs  Lab 10/28/18 0800 10/31/18 0756 11/02/18 1736  ALBUMIN 2.1* 2.4* 2.5*   No results for input(s): LIPASE, AMYLASE in the last 168 hours. No results for input(s): AMMONIA in the last 168 hours. CBC: Recent Labs  Lab 10/31/18 0757 11/02/18 1703  WBC 10.9* 10.7*  HGB 9.2* 8.3*  HCT 30.8* 28.0*  MCV 98.1 97.9  PLT 295 281   Cardiac Enzymes: No results for input(s): CKTOTAL, CKMB, CKMBINDEX, TROPONINI in the last 168 hours. CBG: Recent Labs  Lab 11/02/18 0421 11/02/18 0729 11/02/18 1702 11/02/18 2115 11/03/18 0718  GLUCAP 203* 150* 138* 380* 300*    Studies/Results: No results found. Medications: .  sodium chloride    . sodium chloride 10 mL/hr at 10/28/18 1408  . cefTAZidime (FORTAZ)  IV Stopped (11/02/18 1647)  . ferric gluconate (FERRLECIT/NULECIT) IV 62.5 mg (10/28/18 1128)   . calcitRIOL  2.5 mcg Oral Q M,W,F-HD  . cinacalcet  30 mg Oral Q supper  . darbepoetin (ARANESP) injection - DIALYSIS  100 mcg Intravenous Q Fri-HD  . insulin aspart  0-5 Units Subcutaneous QHS  . insulin aspart  0-9 Units Subcutaneous TID WC  . insulin glargine  5 Units Subcutaneous BID  . multivitamin  1 tablet Oral QHS  . sevelamer carbonate  2,400 mg Oral TID WC  . sodium chloride flush  3 mL Intravenous Q12H

## 2018-11-03 NOTE — Progress Notes (Signed)
Attempted to discharge patient. He insisted that he did not have any pain medication at home, he states his mother threw it away. He also feels that the hydrocodone would not effectively treat his pain and requested morphine specifically. Dr.Price was notified and plans to send PRN pain prescription to patients pharmacy.

## 2018-11-03 NOTE — Progress Notes (Signed)
Patient talking to [facility] at the moment.

## 2018-11-03 NOTE — Progress Notes (Signed)
11/02/2018 9:00AM  Late Entry  Spoke to patient regarding care concerns and assisted in service recovery. Office of Patient Experience Maryan Puls), Risk Management Maurie Boettcher), Dietary Director Jeronimo Norma) present to conversation.   Joal Eakle MSN, RN-BC, CNML [facility] Phone: [phone]

## 2018-11-03 NOTE — Discharge Summary (Signed)
Discharge Summary  [name]:[id number] DOB: 14-Dec-1980  PCP: Patient, No Pcp Per  Admit date: 10/26/2018 Discharge date: 11/03/2018  Time spent: 56mins, more than 50% time spent on coordination of care. Case discussed with nephrology and podiatry.  Recommendations for Outpatient Follow-up:  1. F/u with Podiatry Dr March Rummage this pm in the office. 2. F/u with nephrology, continue dialysis, get ancef through dialysis for two weeks  Discharge Diagnoses:  Active Hospital Problems   Diagnosis Date Noted  . Cellulitis 10/26/2018  . Open wound of left foot   . Encounter for orthopedic aftercare following surgical amputation   . Foot infection   . Gangrene of toe of left foot ([place])   . Diabetic ulcer of left midfoot associated with diabetes mellitus due to underlying condition, with bone involvement without evidence of necrosis ([place])   . DM (diabetes mellitus), secondary, uncontrolled, with neurologic complications ([place])   . Osteomyelitis of foot, left, acute ([place])   . Anemia of chronic disease 08/18/2018  . Diabetic foot infection ([place])   . Cellulitis and abscess of toe of left foot   . ESRD (end stage renal disease) on dialysis [facility]) 03/19/2014    Resolved Hospital Problems  No resolved problems to display.    Discharge Condition: stable  Diet recommendation: heart healthy/carb modified/renal diet  Filed Weights   11/02/18 0209 11/02/18 1121 11/02/18 1614  Weight: 83.7 kg 84.4 kg 76.4 kg    History of present illness: (per admitting MD Dr Shanon Brow) PCP: Patient, No Pcp Per  Patient coming from: Home  Chief Complaint: Foot red  HPI: [name] is a 38 y.o. male with medical history significant of end-stage renal disease dialysis Monday Wednesday Friday last dialysis today, chronic bilateral foot wounds, diabetes with recent just discharged yesterday with a wound VAC and on oral antibiotics.  He has been seen by Dr. Ralene Bathe with podiatry.  Today when the wound care nurse  went his wound VAC was not hooked up appropriately when she removed it some skin peeled off and it was very red.  Patient was then therefore sent to the emergency department for infection of his foot again.  Podiatry called who are stating that he would likely need amputation which has been offered in the past but the patient has been reluctant to do.  Patient is being referred for admission for diabetic foot infection.   Hospital Course:  Principal Problem:   Cellulitis Active Problems:   ESRD (end stage renal disease) on dialysis ([name])   Cellulitis and abscess of toe of left foot   Diabetic foot infection ([name])   Anemia of chronic disease   Osteomyelitis of foot, left, acute ([name])   DM (diabetes mellitus), secondary, uncontrolled, with neurologic complications ([name])   Diabetic ulcer of left midfoot associated with diabetes mellitus due to underlying condition, with bone involvement without evidence of necrosis ([place])   Foot infection   Gangrene of toe of left foot ([place])   Open wound of left foot   Encounter for orthopedic aftercare following surgical amputation   Nonhealing left foot wound/diabetic foot ulcer/eft foot osteomyelitis -pe vascular sugery Dr Donzetta Matters "he has palpable pulses he does not need any revascularization at this time"  "I attempted to discuss with him that he likely needs at least transmetatarsal amputation possible below-knee amputation but he is not interested in this. He was no part of discussing amputation at this time in hopes that he can take a pill for this problem." detail please  refer to Dr Claretha Cooper consult note from 1/30 -Status post surgical intervention on 10/28/2018 with a left great toe amputation with I&D and repeat I/D on 2/4 with wound vac application by podiatry Dr price -On wound vac break 2/5 due to wound is macerated -blood culture no growth, mrsa screening negative, surgical wound + MSSA, he received unasyn in the hospital -case discussed with podiatry  Dr March Rummage who recommend fortaz through dialysis for two more weeks, , pharmacy recommended Ancef through dialysis instead of fortaz, as ancef has better coverage to MSSA. nephrology made aware to arrange abx through dialysis - patient is cleared to discharge from the hospital  per podiatry and follow up with Dr March Rummage in the office this afternoon, Podiatry Dr price input greatly appreciated  ESRD on HD Not making urine anymore Plan per nephrology Nephrology input appreciated  Anemia of chronic disease - Further management per renal.  Insulin dependent DM2 a1c in 09/2017 was 6.7   Code Status: full  Family Communication: patient   Disposition Plan: home  with podiatry clearance and nephrology clearance   Consultants:  Vascular surgery Dr Donzetta Matters  Podiatry Dr March Rummage  nephrology  Procedures:  Status post surgical intervention on 10/28/2018 with a left great toe amputation with I&D and repeat I/D on 2/4 with wound vac application by podiatry Dr price  Antibiotics:  unasyn then fortaz through HD   Discharge Exam: BP (!) 216/124 (BP Location: Right Arm)   Pulse 91   Temp 97.8 F (36.6 C) (Oral)   Resp 18   Ht 5\' 6"  (1.676 m)   Wt 76.4 kg Comment: standing weight  SpO2 95%   BMI 27.19 kg/m   General: NAD Cardiovascular: RRR Respiratory: CTABL Extremity: left foot post op changes  Discharge Instructions You were cared for by a hospitalist during your hospital stay. If you have any questions about your discharge medications or the care you received while you were in the hospital after you are discharged, you can call the unit and asked to speak with the hospitalist on call if the hospitalist that took care of you is not available. Once you are discharged, your primary care physician will handle any further medical issues. Please note that NO REFILLS for any discharge medications will be authorized once you are discharged, as it is imperative that you return to your  primary care physician (or establish a relationship with a primary care physician if you do not have one) for your aftercare needs so that they can reassess your need for medications and monitor your lab values.  Discharge Instructions    Diet - low sodium heart healthy   Complete by:  As directed    Carb modified, renal diet   Increase activity slowly   Complete by:  As directed      Allergies as of 11/03/2018   No Known Allergies     Medication List    STOP taking these medications   doxycycline 100 MG tablet Commonly known as:  VIBRA-TABS     TAKE these medications   calcitRIOL 0.5 MCG capsule Commonly known as:  ROCALTROL Take 5 capsules (2.5 mcg total) by mouth every Monday, Wednesday, and Friday with hemodialysis.   ceFAZolin 2-4 GM/100ML-% IVPB Commonly known as:  ANCEF Inject 100 mLs (2 g total) into the vein every Monday, Wednesday, and Friday with hemodialysis. Start taking on:  November 04, 2018   cinacalcet 30 MG tablet Commonly known as:  SENSIPAR Take 30 mg by mouth daily  with supper.   clonazePAM 0.5 MG tablet Commonly known as:  KLONOPIN Take 0.5 mg by mouth daily as needed for anxiety (sleep).   collagenase ointment Commonly known as:  SANTYL Apply 1 application topically daily.   HYDROcodone-acetaminophen 5-325 MG tablet Commonly known as:  NORCO/VICODIN Take 1-2 tablets by mouth every 6 (six) hours as needed for moderate pain or severe pain.   insulin aspart 100 UNIT/ML injection Commonly known as:  novoLOG Inject 6-7 Units into the skin 2 (two) times daily with a meal.   insulin glargine 100 UNIT/ML injection Commonly known as:  LANTUS Inject 0.15 mLs (15 Units total) into the skin at bedtime.   [employer] TEST STRIPS test strip Generic drug:  glucose blood by Other route 4 (four) times a day as needed.   multivitamin Tabs tablet Take 1 tablet by mouth at bedtime.   ondansetron 4 MG disintegrating tablet Commonly known as:  ZOFRAN ODT Take  1 tablet (4 mg total) by mouth every 8 (eight) hours as needed for nausea or vomiting.   sevelamer carbonate 800 MG tablet Commonly known as:  RENVELA Take 2,400-3,200 mg by mouth See admin instructions. Take 3 - 4 tablets (2400 mg -3200 mg) by mouth two times daily with meals      No Known Allergies Follow-up Information    Evelina Bucy, DPM Follow up on 11/03/2018.   Specialty:  Podiatry Why:  4;15 pm, please bring in black spondes and your home wound vac machine to the appointment. Contact information: [address] [address] [phone]        continue dialysis on your regular schedule tomorrow Follow up.        Waynetta Sandy, MD Follow up.   Specialties:  Vascular Surgery, Cardiology Why:  as needed Contact information: [place] [address] [place] Follow up.   Contact information: [address] [place]  [address] [phone]            The results of significant diagnostics from this hospitalization (including imaging, microbiology, ancillary and laboratory) are listed below for reference.    Significant Diagnostic Studies: Ct Abdomen Pelvis Wo Contrast  Result Date: 10/20/2018 CLINICAL DATA:  Nausea, vomiting and diarrhea for 4 days. Patient is on dialysis. EXAM: CT ABDOMEN AND PELVIS WITHOUT CONTRAST TECHNIQUE: Multidetector CT imaging of the abdomen and pelvis was performed following the standard protocol without IV contrast. COMPARISON:  CT scan 07/30/2014 FINDINGS: Lower chest: Mild cardiac enlargement but no pericardial effusion. There are advanced three-vessel coronary artery calcifications for age. Hepatobiliary: No focal hepatic lesions or intrahepatic biliary dilatation. The gallbladder is grossly normal. No common bile duct dilatation. Pancreas: No mass, inflammation or ductal dilatation without contrast. Spleen: Normal size.  No focal lesions.  Adrenals/Urinary Tract: The adrenal glands are normal. The kidneys demonstrate renal cortical thinning and atrophy. There are extensive renal artery calcifications due to diabetes. No hydronephrosis or renal calculi. The bladder is grossly normal. Stomach/Bowel: The stomach, duodenum, small bowel and colon are grossly normal without oral contrast. No obvious acute inflammatory changes, mass lesions or obstructive findings. The terminal ileum and appendix are normal. Vascular/Lymphatic: Scattered aortic calcifications but no aneurysm. The aortic branch vessels are calcified including the SMA and IMA. Small scattered mesenteric and retroperitoneal lymph nodes but no mass or overt adenopathy. Reproductive: The prostate gland and seminal vesicles are normal. Extensive calcifications of the vas  deferens are noted. Other: Small bilateral inguinal lymph nodes likely inflammatory/hyperplastic. No pelvic mass or adenopathy. No free pelvic fluid collections. Musculoskeletal: No significant bony findings. IMPRESSION: 1. No acute abdominal/pelvic findings, mass lesions or adenopathy given limitation of no IV contrast or oral contrast. 2. Extensive small vessel disease due to diabetes. 3. Small kidneys with renal cortical atrophy. 4. Fairly extensive, age advanced, three-vessel coronary artery calcifications. Electronically Signed   By: Marijo Sanes M.D.   On: 10/20/2018 14:49   Dg Chest 1 View  Result Date: 10/20/2018 CLINICAL DATA:  Leukocytosis EXAM: CHEST  1 VIEW COMPARISON:  11/23/2017 FINDINGS: No acute opacity or pleural effusion. Stable cardiomediastinal silhouette. No pneumothorax. Upper extremity stents. IMPRESSION: No active disease. Electronically Signed   By: Donavan Foil M.D.   On: 10/20/2018 15:15   Dg Tibia/fibula Left  Result Date: 10/20/2018 CLINICAL DATA:  Leukocytosis.  Neuropathy. EXAM: LEFT TIBIA AND FIBULA - 2 VIEW COMPARISON:  No recent prior. FINDINGS: No acute bony or joint abnormality  identified. No evidence of fracture or dislocation. Peripheral vascular calcification. IMPRESSION: 1. No acute bony or joint abnormality. No evidence of fracture dislocation. 2.  Peripheral vascular disease. Electronically Signed   By: Marcello Moores  Register   On: 10/20/2018 15:13   Ct Foot Left W Contrast  Result Date: 10/27/2018 CLINICAL DATA:  Soft tissue swelling, cellulitis and skin lesion. Osteomyelitis is suspected. EXAM: CT OF THE LOWER LEFT EXTREMITY WITH CONTRAST TECHNIQUE: Multidetector CT imaging of the lower left extremity was performed according to the standard protocol following intravenous contrast administration. COMPARISON:  Plain radiographs dating back through 08/11/2018 of the foot and MRI from 08/18/2018. CONTRAST:  137mL OMNIPAQUE IOHEXOL 300 MG/ML  SOLN FINDINGS: Bones/Joint/Cartilage The patient is status post amputation of the fifth ray sparing the base of the fifth metatarsal. The surgical margin appears undulating and slightly irregular and the possibility of osteomyelitis is not excluded involving the amputation margin of the fifth metatarsal base. The distal tibia and fibula, tibiotalar, subtalar and midfoot articulations are maintained. No bone destruction or fracture of the forefoot is identified despite soft tissue changes described below. Ligaments Suboptimally assessed by CT. Muscles and Tendons The tendons crossing the ankle and forefoot are intact without evidence of tenosynovitis. No pyomyositis. Generalized diffuse disuse atrophy of the musculature of the foot. Soft tissues Soft tissue wound over the dorsum of the forefoot between the great and second toes with soft tissue emphysema identified over the dorsum of the forefoot. No focal fluid collection to suggest abscess. No CT evidence of acute osteomyelitis of the adjacent forefoot deep to the soft tissue. IMPRESSION: 1. The patient is status post amputation of the fifth ray sparing the base of the fifth metatarsal. The surgical  margin appears undulating and slightly irregular and the possibility of osteomyelitis is not excluded at the surgical margin. 2. Soft tissue wound over the dorsum of the forefoot between the great and second toes with soft tissue emphysema over the dorsum of the forefoot. No focal fluid collection to suggest abscess. No adjacent osteomyelitis is identified. Electronically Signed   By: Ashley Royalty M.D.   On: 10/27/2018 21:24   Dg Foot 2 Views Left  Result Date: 10/28/2018 CLINICAL DATA:  Status post amputation of the great toe. Wound VAC in place. EXAM: LEFT FOOT - 2 VIEW COMPARISON:  Radiographs dated 10/26/2018 FINDINGS: The patient has undergone interval resection of the great toe. Wound VAC in place. Prior amputation of fifth digit and of the majority of  the fifth metatarsal with a remnant of the base. There is soft tissue swelling adjacent to the first metatarsal. No gas in the soft tissues. Chronic arterial calcifications in the left lower leg and foot. IMPRESSION: Satisfactory appearance of the left foot after amputation of the great toe. Electronically Signed   By: Lorriane Shire M.D.   On: 10/28/2018 17:58   Dg Foot Complete Left  Result Date: 10/26/2018 CLINICAL DATA:  Left foot pain. EXAM: LEFT FOOT - COMPLETE 3+ VIEW COMPARISON:  Radiographs of October 20, 2018. FINDINGS: Status post surgical resection of most of the fifth metatarsal. Vascular calcifications are noted. No acute fracture or dislocation is noted. There remains indistinct margins involving the residual portion of the fifth metatarsal as described on prior exam. IMPRESSION: Postsurgical changes as described above. Residual irregularity involving the surgical margins of the residual portion of fifth metatarsal as described on prior exam. The possibility of osteomyelitis can not be excluded. Electronically Signed   By: Marijo Conception, M.D.   On: 10/26/2018 17:49   Dg Foot Complete Left  Result Date: 10/20/2018 CLINICAL DATA:   Leukocytosis EXAM: LEFT FOOT - COMPLETE 3+ VIEW COMPARISON:  10/19/2018, 08/20/2018 FINDINGS: Status post resection of the fifth digit with small remnant base of fifth metatarsal. Frayed appearance at the cut surface of the metatarsal with patchy lucencies on the lateral view. Vascular calcifications. No soft tissue emphysema. IMPRESSION: Status post resection of the fifth digit with residual base of fifth metatarsal. As compared with 08/20/2018, there is indistinct appearance of the cut margin of the metatarsal with heterogeneous lucencies on the lateral view, raising concern for osteomyelitis. Electronically Signed   By: Donavan Foil M.D.   On: 10/20/2018 15:14   Dg Foot Complete Left  Result Date: 10/19/2018 Please see detailed radiograph report in office note.   Microbiology: Recent Results (from the past 240 hour(s))  Blood culture (routine x 2)     Status: None   Collection Time: 10/26/18  5:20 PM  Result Value Ref Range Status   Specimen Description BLOOD RIGHT ANTECUBITAL  Final   Special Requests   Final    BOTTLES DRAWN AEROBIC AND ANAEROBIC Blood Culture adequate volume   Culture   Final    NO GROWTH 5 DAYS Performed at [facility], [address], [place], [place] [address]    Report Status 10/31/2018 FINAL  Final  Blood culture (routine x 2)     Status: None   Collection Time: 10/26/18  5:25 PM  Result Value Ref Range Status   Specimen Description BLOOD RIGHT FOREARM  Final   Special Requests   Final    BOTTLES DRAWN AEROBIC AND ANAEROBIC Blood Culture adequate volume   Culture   Final    NO GROWTH 5 DAYS Performed at [facility], [place] [address], [place], [place] [address]    Report Status 10/31/2018 FINAL  Final  MRSA PCR Screening     Status: None   Collection Time: 10/27/18  4:11 AM  Result Value Ref Range Status   MRSA by PCR NEGATIVE NEGATIVE Final    Comment:        The GeneXpert MRSA Assay (FDA approved for NASAL specimens only), is one  component of a comprehensive MRSA colonization surveillance program. It is not intended to diagnose MRSA infection nor to guide or monitor treatment for MRSA infections. Performed at [facility], [place] [address], [place], [place] [address]   Aerobic/Anaerobic Culture (surgical/deep  wound)     Status: None   Collection Time: 10/28/18  3:05 PM  Result Value Ref Range Status   Specimen Description TISSUE TOE LEFT  Final   Special Requests NONE  Final   Gram Stain NO WBC SEEN NO ORGANISMS SEEN   Final   Culture   Final    RARE STAPHYLOCOCCUS AUREUS NO ANAEROBES ISOLATED Performed at [facility], [address] [place], [place] [address]    Report Status 11/02/2018 FINAL  Final   Organism ID, Bacteria STAPHYLOCOCCUS AUREUS  Final      Susceptibility   Staphylococcus aureus - MIC*    CIPROFLOXACIN <=0.5 SENSITIVE Sensitive     ERYTHROMYCIN <=0.25 SENSITIVE Sensitive     GENTAMICIN <=0.5 SENSITIVE Sensitive     OXACILLIN <=0.25 SENSITIVE Sensitive     TETRACYCLINE <=1 SENSITIVE Sensitive     VANCOMYCIN <=0.5 SENSITIVE Sensitive     TRIMETH/SULFA <=10 SENSITIVE Sensitive     CLINDAMYCIN <=0.25 SENSITIVE Sensitive     RIFAMPIN <=0.5 SENSITIVE Sensitive     Inducible Clindamycin NEGATIVE Sensitive     * RARE STAPHYLOCOCCUS AUREUS     Labs: Basic Metabolic Panel: Recent Labs  Lab 10/28/18 0800 10/31/18 0756 11/02/18 1736  NA 132* 135 132*  K 4.8 4.8 6.5*  CL 94* 95* 97*  CO2 26 25 22   GLUCOSE 211* 96 195*  BUN 33* 38* 38*  CREATININE 9.01* 9.42* 10.34*  CALCIUM 9.2 8.9 9.3  PHOS 5.9* 7.8* 7.6*   Liver Function Tests: Recent Labs  Lab 10/28/18 0800 10/31/18 0756 11/02/18 1736  ALBUMIN 2.1* 2.4* 2.5*   No results for input(s): LIPASE, AMYLASE in the last 168 hours. No results for input(s): AMMONIA in the last 168 hours. CBC: Recent Labs  Lab 10/31/18 0757 11/02/18 1703  WBC 10.9* 10.7*  HGB 9.2* 8.3*  HCT 30.8* 28.0*  MCV 98.1 97.9  PLT 295  281   Cardiac Enzymes: No results for input(s): CKTOTAL, CKMB, CKMBINDEX, TROPONINI in the last 168 hours. BNP: BNP (last 3 results) No results for input(s): BNP in the last 8760 hours.  ProBNP (last 3 results) No results for input(s): PROBNP in the last 8760 hours.  CBG: Recent Labs  Lab 11/02/18 0729 11/02/18 1702 11/02/18 2115 11/03/18 0718 11/03/18 1127  GLUCAP 150* 138* 380* 300* 173*       Signed:  Florencia Reasons MD, PhD  [facility] 11/03/2018, 2:07 PM

## 2018-11-03 NOTE — Progress Notes (Signed)
Patient requests IV morphine, but has stated he will drive himself home. Spoke with the [facility] who advised he will need to wait the half life of his IV morphine prior to leaving.

## 2018-11-03 NOTE — Care Management Note (Addendum)
Case Management Note  Patient Details  Name: [name] MRN: [id number] Date of Birth: 1981-01-01  Subjective/Objective:                    Action/Plan:Awaiting call back from Dr March Rummage for [facility] order with wound care instructions. Send private chat message.  home health order given to Butch Penny with [facility]   Called Dr Price's office for wound care / home health orders Expected Discharge Date:  11/03/18               Expected Discharge Plan:  [place]  In-House Referral:  NA  Discharge planning Services  CM Consult  Post Acute Care Choice:  Resumption of Svcs/PTA Provider Choice offered to:  Patient  DME Arranged:  Vac(vac already arranged and pt home from previous admission) DME Agency:  [place]:  RN [facility] Agency:  [place]  Status of Service:  Completed, signed off  If discussed at [place] of Stay Meetings, dates discussed:    Additional Comments:  Marilu Favre, RN 11/03/2018, 11:22 AM

## 2018-11-03 NOTE — Telephone Encounter (Signed)
Called and spoke to patient at length for over 25 minutes. Patient was very upset during our conversation and used significant profanity during the call. I patiently listened and let the patient air his frustrations regarding the bleeding he had in his foot last night. On call physician Dr. Amalia Hailey was called after 10pm last night regarding bleeding through his dressing, and attempted to assist patient, but he would not accept instructions from anyone other than me. Of note, I was not on call. Dr. Amalia Hailey did call me, and I spoke and relayed my recommendations to him. I relayed my recommendations to [facility], as documented in the patients EMR.  Patient was upset that I did not directly communicate to patient the instructions I gave to his RN. I informed him I did speak to his RN and relay the instructions. I offered my apologies to patient that I did not relay the instructions to him directly . I explained that this was an oversight. Patient believes that knowing him I should have known that he would not take the word of his RN without speaking to me directly.   Patient was upset that they did not get me to talk to him directly. Dr. Amalia Hailey did attempt to call me again after we spoke, however being after 10:30 and not being on call, I was asleep and did not see his call or messages until early this AM.  Patient also asked that I call his mother and inform her of the situation regarding his foot. I told him I would do so, as I have attempted to do in the past. Patient asked me to repeatedly call her until she answered. I told him I would call her but I cannot call repeatedly. I called her today around 58. No answer. VM Left.

## 2018-11-03 NOTE — Progress Notes (Addendum)
11/03/2018 9:00AM   Spoke to patient regarding care concerns and assisted in service recovery. Office of Patient Experience Maryan Puls), Risk Management Maurie Boettcher), 5N Assistant Director Belva Chimes [place]) present to conversation.   Bernhardt Riemenschneider MSN, RN-BC, CNML [facility] Phone: [phone]

## 2018-11-03 NOTE — Progress Notes (Signed)
Spoke with Dr. Erlinda Hong. Change to ceftazidime to Ancef due better MSSA coverage. This can be dosed with HD. 2g IV qHD x 2 wks  Onnie Boer, PharmD, [place], AAHIVP, CPP Infectious Disease Pharmacist 11/03/2018 2:12 PM

## 2018-11-03 NOTE — Progress Notes (Addendum)
Subjective:  Patient ID: [name], male    DOB: 22-Mar-1981,  MRN: [id number]  Chief Complaint  Patient presents with  . Routine Post Op    Pt declined to discuss condition or be examined by anybody other than Dr. March Rummage.   38 y.o. male presents for hospital discharge follow-up and planned wound VAC application.  Patient was verbally abusive to provider and staff during the encounter, and police were called out of concern for staff and provider safety. See full narrative details below. He did bring his [facility] machine from home for his [name] to be reapplied.  Review of Systems: Negative except as noted in the HPI. Denies N/V/F/Ch.  Past Medical History:  Diagnosis Date  . Diabetes mellitus without complication ([place])   . Diabetic gastroparesis ([place])   . Dialysis patient ([place])   . Hypertension   . Renal disorder    Dialysis  . Sepsis ([place])     Current Outpatient Medications:  .  calcitRIOL (ROCALTROL) 0.5 MCG capsule, Take 5 capsules (2.5 mcg total) by mouth every Monday, Wednesday, and Friday with hemodialysis., Disp: 30 capsule, Rfl: 0 .  [START ON 11/04/2018] ceFAZolin (ANCEF) 2-4 GM/100ML-% IVPB, Inject 100 mLs (2 g total) into the vein every Monday, Wednesday, and Friday with hemodialysis., Disp: 1 each, Rfl: 7 .  cinacalcet (SENSIPAR) 30 MG tablet, Take 30 mg by mouth daily with supper., Disp: , Rfl:  .  clonazePAM (KLONOPIN) 0.5 MG tablet, Take 0.5 mg by mouth daily as needed for anxiety (sleep). , Disp: , Rfl:  .  collagenase (SANTYL) ointment, Apply 1 application topically daily., Disp: 15 g, Rfl: 0 .  glucose blood (KROGER TEST STRIPS) test strip, by Other route 4 (four) times a day as needed., Disp: , Rfl:  .  HYDROcodone-acetaminophen (NORCO/VICODIN) 5-325 MG tablet, Take 1-2 tablets by mouth every 6 (six) hours as needed for moderate pain or severe pain., Disp: 20 tablet, Rfl: 0 .  insulin aspart (NOVOLOG) 100 UNIT/ML injection, Inject 6-7 Units into the skin 2 (two) times  daily with a meal. , Disp: , Rfl:  .  insulin glargine (LANTUS) 100 UNIT/ML injection, Inject 0.15 mLs (15 Units total) into the skin at bedtime., Disp: 10 mL, Rfl: 1 .  multivitamin (RENA-VIT) TABS tablet, Take 1 tablet by mouth at bedtime., Disp: 30 tablet, Rfl: 0 .  ondansetron (ZOFRAN ODT) 4 MG disintegrating tablet, Take 1 tablet (4 mg total) by mouth every 8 (eight) hours as needed for nausea or vomiting., Disp: 10 tablet, Rfl: 0 .  sevelamer carbonate (RENVELA) 800 MG tablet, Take 2,400-3,200 mg by mouth See admin instructions. Take 3 - 4 tablets (2400 mg -3200 mg) by mouth two times daily with meals, Disp: , Rfl:   Social History   Tobacco Use  Smoking Status Never Smoker  Smokeless Tobacco Never Used    No Known Allergies Objective:  There were no vitals filed for this visit. There is no height or weight on file to calculate BMI. Constitutional Well developed.  Vascular Left foot warm to touch  Neurologic Patient agitated and irate. Oriented to person, place, and time. Epicritic sensation to light touch grossly absent bilaterally.  Dermatologic Open ulceration left first metatarsal. Scant drainage, no active bleeding. No warmth or erythema. No signs of acute infection. Wound base appears granular with overlying skin graft substitute that is sutured in place.  The wound graft appears slightly desiccated  Orthopedic: Left hallux and fifth toe amputations noted.   Radiographs: None  Assessment:   1. 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  S/p L I&D of Abscess, Hallux Amputation, with debridement and wound subsequent partial closure -Wound VAC was reapplied.  Telfa pad and antibiotic ointment was applied over the area of the skin graft substitute, followed by the black sponge and occlusive dressing. This was set to the  patient's wound VAC machine.  The wound VAC's previous settings were indeed 125 mm HG continuous, but the pressure reading still read 0 mmHG and the sponge did not appear sucked down to the wound. The cannister wash changed, and the VAC immediately read 125 mmHg of pressure. It is possible that the cannister stopped working at some point after he was discharged. The VAC was reinforced and seal leak was checked and good seal confirmed.. Pressure was confirmed to be at 125 mmHg. I spoke to the patient's [name] in his presence and relayed that the [name] Lc settings that were set at discharge were indeed correct.  I gave her verbal orders regarding care including nonadherent dressing over the graft followed by black sponge in the occlusive dressing.  We will send a new referral for home health care tomorrow.  -I advised the patient to call the office should the machine not read a pressure of 125 or should it alert suggesting a seal leak or blockage.  Patient asked if he could call me directly, and I directed him to call the office instead. -As mentioned above, patient was verbally abusive during the encounter to both provider and staff.  By the end of the visit he was able to calm down and did leave in a collected manner.  I did explain to him that I attempted to call his mother to explain what was going on with his foot as he asked, but that I was unable to call her repeatedly and that this is an unreasonable expectation to ask of a physician.  I called her as I told him I would, and left her a voicemail.  I did offer apologies regardless.  I again offered apologies for not speaking to him directly regarding his nursing instructions last night however I did remind him that this was after hours and that there are limits to my availability and that the on-call coverage system is in effect for this reason.  See separate telephone encounter for further details regarding this. -I called the patient's mother in his presence  at his request.  He asked me to call on my personal phone to "show that I was serious" about my apology, but I was able to get him to call on his phone instead. I explained in detail his medical course from initial presentation with infection to now.  I reiterated to her that his decreased ability to fight infections is multifactorial, and that his circulation, uncontrolled diabetes, non-compliance with diet, end-stage renal disease all complicate his recovery. -His behavior this visit troubles me. I am concerned about his understanding of the physician-patient relationship.  Patient repeatedly stated that he considers me like a brother and "loves me like a brother" and that my actions toward him suggest I think of him just like any other patient. I think he does not have a healthy boundary of what to expect from a provider with regards to the standard of care and this is the impetus for his anger  and feeling wronged that I did not do as he expected. -Based upon his most recent hospital admissions, it is evident that he at minimum has significant anger issues.  He made threatening comments today and was irate, verbally abusive, and used significant profanity and shouted at the provider. Staff called the police out of concern for provider safety and they were later noted to be outside the room during the encounter. He stated that he had too much respect for me as a provider to bring a weapon to our facility, but suggested that if he did be would not even know.  Per discussion with administration he made a similar comment to other staff members.  He has had an official warning before that should he threaten staff he would be dismissed. -Based upon patient's threatening comments we will consider whether or not we can continue to proceed with providing him care in a manner conducive to the safety of both staff and provider.  -He is tentatively scheduled to come back next week for an additional wound VAC change    No follow-ups on file.

## 2018-11-03 NOTE — Telephone Encounter (Signed)
Heather from [facility] called stating that the patient is being discharged today but they are unsure of Wound Care orders for the home health nurse. Please give them a call back.

## 2018-11-03 NOTE — Progress Notes (Addendum)
10/31/2018 9:35AM   Late Entry Spoke to patient regarding care concerns and assisted in service recovery. Office of Patient Experience, Maryan Puls, assisted and present for conversation.   Makahla Kiser MSN, RN-BC, CNML [facility] Phone: [phone]

## 2018-11-03 NOTE — Progress Notes (Signed)
11/03/2018 4:52 PM  [name] to be D/C'd Home per MD order.  Discussed prescriptions and follow up appointments with the patient. Prescriptions were discussed with the patient, medication list explained in detail. Pt verbalized understanding.  Also discussed patient legal concern again and preformed additional service recovery.   Allergies as of 11/03/2018   No Known Allergies     Medication List    STOP taking these medications   doxycycline 100 MG tablet Commonly known as:  VIBRA-TABS     TAKE these medications   calcitRIOL 0.5 MCG capsule Commonly known as:  ROCALTROL Take 5 capsules (2.5 mcg total) by mouth every Monday, Wednesday, and Friday with hemodialysis.   ceFAZolin 2-4 GM/100ML-% IVPB Commonly known as:  ANCEF Inject 100 mLs (2 g total) into the vein every Monday, Wednesday, and Friday with hemodialysis. Start taking on:  November 04, 2018   cinacalcet 30 MG tablet Commonly known as:  SENSIPAR Take 30 mg by mouth daily with supper.   clonazePAM 0.5 MG tablet Commonly known as:  KLONOPIN Take 0.5 mg by mouth daily as needed for anxiety (sleep).   collagenase ointment Commonly known as:  SANTYL Apply 1 application topically daily.   HYDROcodone-acetaminophen 5-325 MG tablet Commonly known as:  NORCO/VICODIN Take 1-2 tablets by mouth every 6 (six) hours as needed for moderate pain or severe pain.   insulin aspart 100 UNIT/ML injection Commonly known as:  novoLOG Inject 6-7 Units into the skin 2 (two) times daily with a meal.   insulin glargine 100 UNIT/ML injection Commonly known as:  LANTUS Inject 0.15 mLs (15 Units total) into the skin at bedtime.   KROGER TEST STRIPS test strip Generic drug:  glucose blood by Other route 4 (four) times a day as needed.   multivitamin Tabs tablet Take 1 tablet by mouth at bedtime.   ondansetron 4 MG disintegrating tablet Commonly known as:  ZOFRAN ODT Take 1 tablet (4 mg total) by mouth every 8 (eight) hours as  needed for nausea or vomiting.   sevelamer carbonate 800 MG tablet Commonly known as:  RENVELA Take 2,400-3,200 mg by mouth See admin instructions. Take 3 - 4 tablets (2400 mg -3200 mg) by mouth two times daily with meals       Vitals:   11/03/18 0713 11/03/18 0928  BP: (!) 173/88 (!) 216/124  Pulse: 91 91  Resp: 20 18  Temp: 97.8 F (36.6 C)   SpO2: 99% 95%    IV catheter discontinued intact. Site without signs and symptoms of complications. Dressing and pressure applied. Pt denies pain at this time.  No complaints noted. Left food wrapped in ACE bandage and applied boot shoe.  Patient understands that he needs to go home and his original wound vac to take to MD Price for placement today.   An After Visit Summary was printed and given to the patient. Patient escorted via [place], and D/C home via private auto.  Angelik Walls MSN, RN-BC, CNML [facility] Phone: [phone]

## 2018-11-03 NOTE — Progress Notes (Signed)
10/27/2018 2:00PM  Late Entry  Spoke to patient regarding care concerns and assisted in service recovery. Informed patient that he could not video tape or record staff.   Bristyl Mclees MSN, RN-BC, CNML [facility] Phone: [phone]

## 2018-11-03 NOTE — Progress Notes (Signed)
After explaining indication for BMP, [name] was agreeable to lab draw. Lab tech was unable to find a vein in hand or arm, and requested to stick his finger for sample. ShaneEndres refused due to scarring on finger tips from CBG checks.

## 2018-11-03 NOTE — Progress Notes (Signed)
11/01/2018 9:05AM   Late Entry Attempted to speak to patient regarding care concerns and assist in service recovery but patient was sleep. Left patient a note to call me back when he awakes.  Kerrigan Gombos MSN, RN-BC, CNML [facility] Phone: [phone]

## 2018-11-04 ENCOUNTER — Telehealth: Payer: Self-pay | Admitting: *Deleted

## 2018-11-04 ENCOUNTER — Encounter (HOSPITAL_COMMUNITY): Payer: Self-pay | Admitting: Podiatry

## 2018-11-04 DIAGNOSIS — N2581 Secondary hyperparathyroidism of renal origin: Secondary | ICD-10-CM | POA: Diagnosis not present

## 2018-11-04 DIAGNOSIS — N186 End stage renal disease: Secondary | ICD-10-CM | POA: Diagnosis not present

## 2018-11-04 DIAGNOSIS — E1022 Type 1 diabetes mellitus with diabetic chronic kidney disease: Secondary | ICD-10-CM | POA: Diagnosis not present

## 2018-11-04 DIAGNOSIS — D631 Anemia in chronic kidney disease: Secondary | ICD-10-CM | POA: Diagnosis not present

## 2018-11-04 DIAGNOSIS — M869 Osteomyelitis, unspecified: Secondary | ICD-10-CM | POA: Diagnosis not present

## 2018-11-04 DIAGNOSIS — R739 Hyperglycemia, unspecified: Secondary | ICD-10-CM | POA: Diagnosis not present

## 2018-11-04 DIAGNOSIS — D509 Iron deficiency anemia, unspecified: Secondary | ICD-10-CM | POA: Diagnosis not present

## 2018-11-06 NOTE — Progress Notes (Signed)
HPI: 38 year old male h/o IDDM uncontrolled, ESRD, on dialysis presents the office today for new blisters that are developed to the patient's left foot.  Patient has a history of osteomyelitis with abscess and recurrent infections to the left lower extremity.  Patient presents to the office today very irritable and distressed over the new ulcers that have developed.  He says that he believes the wound was healing but then he noticed swelling with darkness and redness around the foot.  He presents for further treatment and evaluation  Past Medical History:  Diagnosis Date  . Diabetes mellitus without complication ([place])   . Diabetic gastroparesis ([place])   . Dialysis patient ([place])   . Hypertension   . Renal disorder    Dialysis  . Sepsis [facility])          Physical Exam: General: The patient is alert and oriented x3 in no acute distress.  Dermatology: Large nonpurulent bullous blisterations noted specifically localized around the first MTPJ of the left foot.  Bullous lesions are intact.  No significant malodor noted.  There is some erythema and edema to the first ray of the foot.  Ulcer also noted to the lateral aspect of the left foot where the patient has had partial fifth ray amputation.  Ulcer appears stable with a granular wound base, moderate serous drainage, no malodor, and minimal amount of fibrotic tissue.  There does not appear to be any significant exposure of bone, muscle, tendon, or ligament.  Vascular: Palpable pedal pulses bilaterally.  Edema and erythema noted to the left foot. Capillary refill within normal limits.  Neurological: Epicritic and protective threshold absent bilaterally.   Musculoskeletal Exam: History of partial fifth ray amputation left.  Radiographic Exam: Stable partial fifth ray amputation with intact fifth met tuberosity noted.  Otherwise joints spaces appear to be preserved with no other sign of osteolysis or evidence of osteomyelitis.  Calcification of  arteries noted consistent with atherosclerosis of native arteries.  Soft tissue swelling also noted surrounding the first MTPJ most prominent to the medial aspect.  There is not appear to be any evidence of gas/radiolucencies within the soft tissue tissue around the first MTPJ or foot in general  Assessment: 1.  Bullous lesion first MTPJ left foot 2.  Cellulitis left foot 3.  IDDM, uncontrolled 4.  ESRD 5.  History of osteomyelitis and partial foot amputation left   Plan of Care:  1. Patient evaluated. X-Rays reviewed.  2.  The fluid-filled bullous lesion was deroofed and drained using a tissue nipper.  Area was cleansed and dry sterile dressing was applied.  Recommend Betadine with dry sterile dressing daily 3.  Cultures were also taken and sent to pathology for culture and sensitivity 4.  Patient is currently on IV antibiotic protocol as per infectious disease.  Patient restarted his IV antibiotics recently.  Continue antibiotic therapy as per ID 5.  Recommend the patient present to the emergency department if symptoms worsen or his condition worsens. 6.  Return to clinic in 1 week      Edrick Kins, DPM [facility]  Dr. Edrick Kins, DPM    [address], [place] [address]  Office [phone]  Fax [phone]

## 2018-11-07 ENCOUNTER — Telehealth: Payer: Self-pay | Admitting: Podiatry

## 2018-11-07 DIAGNOSIS — E1142 Type 2 diabetes mellitus with diabetic polyneuropathy: Secondary | ICD-10-CM | POA: Diagnosis not present

## 2018-11-07 DIAGNOSIS — Z9889 Other specified postprocedural states: Secondary | ICD-10-CM

## 2018-11-07 DIAGNOSIS — M86172 Other acute osteomyelitis, left ankle and foot: Secondary | ICD-10-CM | POA: Diagnosis not present

## 2018-11-07 DIAGNOSIS — L03116 Cellulitis of left lower limb: Secondary | ICD-10-CM

## 2018-11-07 DIAGNOSIS — E1122 Type 2 diabetes mellitus with diabetic chronic kidney disease: Secondary | ICD-10-CM | POA: Diagnosis not present

## 2018-11-07 DIAGNOSIS — L97424 Non-pressure chronic ulcer of left heel and midfoot with necrosis of bone: Secondary | ICD-10-CM | POA: Diagnosis not present

## 2018-11-07 DIAGNOSIS — I120 Hypertensive chronic kidney disease with stage 5 chronic kidney disease or end stage renal disease: Secondary | ICD-10-CM | POA: Diagnosis not present

## 2018-11-07 DIAGNOSIS — L97423 Non-pressure chronic ulcer of left heel and midfoot with necrosis of muscle: Secondary | ICD-10-CM

## 2018-11-07 DIAGNOSIS — E10621 Type 1 diabetes mellitus with foot ulcer: Secondary | ICD-10-CM | POA: Diagnosis not present

## 2018-11-07 DIAGNOSIS — B9561 Methicillin susceptible Staphylococcus aureus infection as the cause of diseases classified elsewhere: Secondary | ICD-10-CM | POA: Diagnosis not present

## 2018-11-07 DIAGNOSIS — E1022 Type 1 diabetes mellitus with diabetic chronic kidney disease: Secondary | ICD-10-CM | POA: Diagnosis not present

## 2018-11-07 DIAGNOSIS — E1065 Type 1 diabetes mellitus with hyperglycemia: Secondary | ICD-10-CM | POA: Diagnosis not present

## 2018-11-07 DIAGNOSIS — M869 Osteomyelitis, unspecified: Secondary | ICD-10-CM | POA: Diagnosis not present

## 2018-11-07 DIAGNOSIS — E1069 Type 1 diabetes mellitus with other specified complication: Secondary | ICD-10-CM | POA: Diagnosis not present

## 2018-11-07 DIAGNOSIS — E08621 Diabetes mellitus due to underlying condition with foot ulcer: Secondary | ICD-10-CM

## 2018-11-07 DIAGNOSIS — T8142XA Infection following a procedure, deep incisional surgical site, initial encounter: Secondary | ICD-10-CM | POA: Diagnosis not present

## 2018-11-07 DIAGNOSIS — N186 End stage renal disease: Secondary | ICD-10-CM | POA: Diagnosis not present

## 2018-11-07 DIAGNOSIS — R739 Hyperglycemia, unspecified: Secondary | ICD-10-CM | POA: Diagnosis not present

## 2018-11-07 DIAGNOSIS — D509 Iron deficiency anemia, unspecified: Secondary | ICD-10-CM | POA: Diagnosis not present

## 2018-11-07 DIAGNOSIS — Z4800 Encounter for change or removal of nonsurgical wound dressing: Secondary | ICD-10-CM | POA: Diagnosis not present

## 2018-11-07 DIAGNOSIS — Z792 Long term (current) use of antibiotics: Secondary | ICD-10-CM | POA: Diagnosis not present

## 2018-11-07 DIAGNOSIS — Z992 Dependence on renal dialysis: Secondary | ICD-10-CM | POA: Diagnosis not present

## 2018-11-07 DIAGNOSIS — Z8631 Personal history of diabetic foot ulcer: Secondary | ICD-10-CM | POA: Diagnosis not present

## 2018-11-07 DIAGNOSIS — D631 Anemia in chronic kidney disease: Secondary | ICD-10-CM | POA: Diagnosis not present

## 2018-11-07 NOTE — Telephone Encounter (Signed)
[facility] to reinstate pt to their care per Dr. March Rummage orders. 8:25am - [place] states pt is a current pt. I informed Barbaraann Rondo, Dr. March Rummage had given verbal orders to pt's nurse.

## 2018-11-07 NOTE — Telephone Encounter (Signed)
Pt left message stating his pcp did get the diabetic shoe paperwork but has not signed it but will sign it and send it to [place] when he can.

## 2018-11-08 ENCOUNTER — Telehealth: Payer: Self-pay | Admitting: Podiatry

## 2018-11-08 NOTE — Telephone Encounter (Signed)
I went to see pt yesterday morning for nursing start of care which we will not be able to do due to his homebound status. I was calling to touch base to see about getting him set up with the wound care center three times a week for his wound vac changes. If you could call me as soon as you get this message at [phone] and if I cannot be reached there, you can call [phone]. I will be going tomorrow to discharge him.

## 2018-11-08 NOTE — Progress Notes (Signed)
11/08/2018 3:47 PM  Mr. Heaphy called department to speak to leadership. He informed this Probation officer that he was on his way to the hospital to request his medical records.   He stated that he has spoken with an attorney at [employer] and they requested that he get his medical record information from Nov 2010 to Feb 2020.   Helped Mr. Celli find medical record office and get the required paperwork completed.   Ailana Cuadrado MSN, RN-BC, CNML [facility] Phone: [phone]

## 2018-11-09 ENCOUNTER — Telehealth: Payer: Self-pay | Admitting: *Deleted

## 2018-11-09 DIAGNOSIS — D631 Anemia in chronic kidney disease: Secondary | ICD-10-CM | POA: Diagnosis not present

## 2018-11-09 DIAGNOSIS — M86172 Other acute osteomyelitis, left ankle and foot: Secondary | ICD-10-CM | POA: Diagnosis not present

## 2018-11-09 DIAGNOSIS — L97424 Non-pressure chronic ulcer of left heel and midfoot with necrosis of bone: Secondary | ICD-10-CM | POA: Diagnosis not present

## 2018-11-09 DIAGNOSIS — E1069 Type 1 diabetes mellitus with other specified complication: Secondary | ICD-10-CM | POA: Diagnosis not present

## 2018-11-09 DIAGNOSIS — D509 Iron deficiency anemia, unspecified: Secondary | ICD-10-CM | POA: Diagnosis not present

## 2018-11-09 DIAGNOSIS — E1022 Type 1 diabetes mellitus with diabetic chronic kidney disease: Secondary | ICD-10-CM | POA: Diagnosis not present

## 2018-11-09 DIAGNOSIS — E10621 Type 1 diabetes mellitus with foot ulcer: Secondary | ICD-10-CM | POA: Diagnosis not present

## 2018-11-09 DIAGNOSIS — M869 Osteomyelitis, unspecified: Secondary | ICD-10-CM | POA: Diagnosis not present

## 2018-11-09 DIAGNOSIS — R739 Hyperglycemia, unspecified: Secondary | ICD-10-CM | POA: Diagnosis not present

## 2018-11-09 DIAGNOSIS — T8142XA Infection following a procedure, deep incisional surgical site, initial encounter: Secondary | ICD-10-CM | POA: Diagnosis not present

## 2018-11-09 DIAGNOSIS — L03116 Cellulitis of left lower limb: Secondary | ICD-10-CM | POA: Diagnosis not present

## 2018-11-09 DIAGNOSIS — N186 End stage renal disease: Secondary | ICD-10-CM | POA: Diagnosis not present

## 2018-11-09 NOTE — Telephone Encounter (Signed)
Valery, I think they are going to put the discharge on hold for now. His foot does not look good though and I'm worried about that. I'm not sure if the wound vac is going to be able to stay on and he doesn't know when it started leaking/beeping. After you see him, please don't say anything about his discharge, but you can call me back at [phone]. He did tell me I could come back out Friday and see him.

## 2018-11-09 NOTE — Telephone Encounter (Signed)
E[place]in[place], RN states she is substituting for pt's regular home health nurse due to an emergency. [name] states when she arrived the Wound Vac was beeping and the wound was macerated. [name] states pt has an appt tomorrow with Dr. March Rummage, she is going to apply wet-to-dry dressing to the site, to could "pinken-up" and then she will be back on Friday. I asked [name] to advise pt of the homebound criteria to continue [place].

## 2018-11-09 NOTE — Telephone Encounter (Signed)
Noted thanks °

## 2018-11-10 ENCOUNTER — Ambulatory Visit (INDEPENDENT_AMBULATORY_CARE_PROVIDER_SITE_OTHER): Payer: Medicare Other | Admitting: Podiatry

## 2018-11-10 DIAGNOSIS — E08621 Diabetes mellitus due to underlying condition with foot ulcer: Secondary | ICD-10-CM

## 2018-11-10 DIAGNOSIS — L97423 Non-pressure chronic ulcer of left heel and midfoot with necrosis of muscle: Secondary | ICD-10-CM

## 2018-11-10 MED ORDER — HYDROCODONE-ACETAMINOPHEN 5-325 MG PO TABS: 1.0000 | ORAL_TABLET | Freq: Four times a day (QID) | ORAL | 0 refills | Status: DC | PRN

## 2018-11-11 DIAGNOSIS — D509 Iron deficiency anemia, unspecified: Secondary | ICD-10-CM | POA: Diagnosis not present

## 2018-11-11 DIAGNOSIS — E1022 Type 1 diabetes mellitus with diabetic chronic kidney disease: Secondary | ICD-10-CM | POA: Diagnosis not present

## 2018-11-11 DIAGNOSIS — M869 Osteomyelitis, unspecified: Secondary | ICD-10-CM | POA: Diagnosis not present

## 2018-11-11 DIAGNOSIS — L97424 Non-pressure chronic ulcer of left heel and midfoot with necrosis of bone: Secondary | ICD-10-CM | POA: Diagnosis not present

## 2018-11-11 DIAGNOSIS — E1069 Type 1 diabetes mellitus with other specified complication: Secondary | ICD-10-CM | POA: Diagnosis not present

## 2018-11-11 DIAGNOSIS — D631 Anemia in chronic kidney disease: Secondary | ICD-10-CM | POA: Diagnosis not present

## 2018-11-11 DIAGNOSIS — R739 Hyperglycemia, unspecified: Secondary | ICD-10-CM | POA: Diagnosis not present

## 2018-11-11 DIAGNOSIS — N186 End stage renal disease: Secondary | ICD-10-CM | POA: Diagnosis not present

## 2018-11-11 DIAGNOSIS — M86172 Other acute osteomyelitis, left ankle and foot: Secondary | ICD-10-CM | POA: Diagnosis not present

## 2018-11-11 DIAGNOSIS — T8142XA Infection following a procedure, deep incisional surgical site, initial encounter: Secondary | ICD-10-CM | POA: Diagnosis not present

## 2018-11-11 DIAGNOSIS — E10621 Type 1 diabetes mellitus with foot ulcer: Secondary | ICD-10-CM | POA: Diagnosis not present

## 2018-11-11 DIAGNOSIS — L03116 Cellulitis of left lower limb: Secondary | ICD-10-CM | POA: Diagnosis not present

## 2018-11-11 DIAGNOSIS — L02412 Cutaneous abscess of left axilla: Secondary | ICD-10-CM | POA: Diagnosis not present

## 2018-11-14 ENCOUNTER — Telehealth: Payer: Self-pay | Admitting: Podiatry

## 2018-11-14 DIAGNOSIS — T8142XA Infection following a procedure, deep incisional surgical site, initial encounter: Secondary | ICD-10-CM | POA: Diagnosis not present

## 2018-11-14 DIAGNOSIS — D631 Anemia in chronic kidney disease: Secondary | ICD-10-CM | POA: Diagnosis not present

## 2018-11-14 DIAGNOSIS — R739 Hyperglycemia, unspecified: Secondary | ICD-10-CM | POA: Diagnosis not present

## 2018-11-14 DIAGNOSIS — L03116 Cellulitis of left lower limb: Secondary | ICD-10-CM | POA: Diagnosis not present

## 2018-11-14 DIAGNOSIS — M869 Osteomyelitis, unspecified: Secondary | ICD-10-CM | POA: Diagnosis not present

## 2018-11-14 DIAGNOSIS — M86172 Other acute osteomyelitis, left ankle and foot: Secondary | ICD-10-CM | POA: Diagnosis not present

## 2018-11-14 DIAGNOSIS — E1069 Type 1 diabetes mellitus with other specified complication: Secondary | ICD-10-CM | POA: Diagnosis not present

## 2018-11-14 DIAGNOSIS — E10621 Type 1 diabetes mellitus with foot ulcer: Secondary | ICD-10-CM | POA: Diagnosis not present

## 2018-11-14 DIAGNOSIS — D509 Iron deficiency anemia, unspecified: Secondary | ICD-10-CM | POA: Diagnosis not present

## 2018-11-14 DIAGNOSIS — E1022 Type 1 diabetes mellitus with diabetic chronic kidney disease: Secondary | ICD-10-CM | POA: Diagnosis not present

## 2018-11-14 DIAGNOSIS — L97424 Non-pressure chronic ulcer of left heel and midfoot with necrosis of bone: Secondary | ICD-10-CM | POA: Diagnosis not present

## 2018-11-14 DIAGNOSIS — N186 End stage renal disease: Secondary | ICD-10-CM | POA: Diagnosis not present

## 2018-11-14 NOTE — Telephone Encounter (Signed)
[name] played recording for new wound care for him using santyl for me. I did call [facility] and the wound vac can only be on hold for two weeks. Is infectious disease involved in his case? Also, has the wound been cultured? Feel free to give me a call back if you need to.

## 2018-11-15 NOTE — Progress Notes (Signed)
Subjective:  Patient ID: [name], male    DOB: 05-13-1981,  MRN: [id number]  Chief Complaint  Patient presents with  . Routine Post Op    Pt will only be seen by Dr. March Rummage.   38 y.o. male presents for wound care follow-up.  Patient more calm today.  Has had the wound VAC on without issue since last application. Marijuana odor present on patient.  Review of Systems: Negative except as noted in the HPI. Denies N/V/F/Ch.  Past Medical History:  Diagnosis Date  . Diabetes mellitus without complication ([place])   . Diabetic gastroparesis ([place])   . Dialysis patient ([place])   . Hypertension   . Renal disorder    Dialysis  . Sepsis ([place])     Current Outpatient Medications:  .  calcitRIOL (ROCALTROL) 0.5 MCG capsule, Take 5 capsules (2.5 mcg total) by mouth every Monday, Wednesday, and Friday with hemodialysis., Disp: 30 capsule, Rfl: 0 .  ceFAZolin (ANCEF) 2-4 GM/100ML-% IVPB, Inject 100 mLs (2 g total) into the vein every Monday, Wednesday, and Friday with hemodialysis., Disp: 1 each, Rfl: 7 .  cinacalcet (SENSIPAR) 30 MG tablet, Take 30 mg by mouth daily with supper., Disp: , Rfl:  .  clonazePAM (KLONOPIN) 0.5 MG tablet, Take 0.5 mg by mouth daily as needed for anxiety (sleep). , Disp: , Rfl:  .  collagenase (SANTYL) ointment, Apply 1 application topically daily., Disp: 15 g, Rfl: 0 .  glucose blood (KROGER TEST STRIPS) test strip, by Other route 4 (four) times a day as needed., Disp: , Rfl:  .  HYDROcodone-acetaminophen (NORCO/VICODIN) 5-325 MG tablet, Take 1-2 tablets by mouth every 6 (six) hours as needed for moderate pain or severe pain., Disp: 20 tablet, Rfl: 0 .  insulin aspart (NOVOLOG) 100 UNIT/ML injection, Inject 6-7 Units into the skin 2 (two) times daily with a meal. , Disp: , Rfl:  .  insulin glargine (LANTUS) 100 UNIT/ML injection, Inject 0.15 mLs (15 Units total) into the skin at bedtime., Disp: 10 mL, Rfl: 1 .  multivitamin (RENA-VIT) TABS tablet, Take 1 tablet by mouth  at bedtime., Disp: 30 tablet, Rfl: 0 .  ondansetron (ZOFRAN ODT) 4 MG disintegrating tablet, Take 1 tablet (4 mg total) by mouth every 8 (eight) hours as needed for nausea or vomiting., Disp: 10 tablet, Rfl: 0 .  sevelamer carbonate (RENVELA) 800 MG tablet, Take 2,400-3,200 mg by mouth See admin instructions. Take 3 - 4 tablets (2400 mg -3200 mg) by mouth two times daily with meals, Disp: , Rfl:   Social History   Tobacco Use  Smoking Status Never Smoker  Smokeless Tobacco Never Used    No Known Allergies Objective:  There were no vitals filed for this visit. There is no height or weight on file to calculate BMI. Constitutional Well developed.  Vascular Left foot warm to touch  Neurologic Oriented to person, place, and time. Epicritic sensation to light touch grossly absent bilaterally.  Dermatologic  5x4 wound Left 1st metatarsal, fibrogranular base.  Wound graft not fully incorporated into the wound bed.  Serous drainage only.  No exposed bone.  Orthopedic: Left hallux and fifth toe amputations noted.   Radiographs: None Assessment:   1. 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  S/p L I&D of Abscess, Hallux Amputation, with debridement and wound subsequent partial closure -Patient's mood  notably better today, patient was not agitated or irate at this visit.  -Patient brought up his previous wound VAC issues.  I asked patient whether or not the wound VAC was beeping when he was at home and he states that it was beeping and alerting to the point we had difficulty sleeping and he would just quiet the alarm.  I informed patient that I think that the seal was lost or the tubing/cannister was blocked and that the alarm was probably trying to inform him of such. -Collagen gel applied to the wound to promote healing of the wound -At  this point I think further wound VAC therapy is not indicated.  The wound does appear to be improving however it does not need increased granulation tissue formation.  At this point the skin will likely have to heal over.  He would likely benefit from a repeat skin graft application or skin graft substitute however I do not think patient will be amenable to another surgery.  In this case we can potentially plan for proceeding with healing by secondary intention. -Proceed with Santyl wet-to-dry dressing changes.  Patient states that he has several tubes of the central gel remaining not need a refill at this time. -Orders recorded for patient's home health care nurse per patient request.  Santyl wet-to-dry three times weekly to the wound base.  Home health care was potentially plan and discharge patient I do not know how much longer he will have home health care and at some point he will have to resume his own dressings again. -Patient requested pain med refill I advised that this will be his last refill. -F/u in 1 week for wound care.   No follow-ups on file.

## 2018-11-15 NOTE — Telephone Encounter (Signed)
I called [place] and informed of Dr.Price 11/15/2018 8:33am orders.

## 2018-11-15 NOTE — Telephone Encounter (Signed)
Yes hold off of wound VAC, santyl WTD 3x weekly.  He has had cultures and is currently on IV abx with HD.

## 2018-11-16 ENCOUNTER — Telehealth: Payer: Self-pay | Admitting: Podiatry

## 2018-11-16 DIAGNOSIS — L03116 Cellulitis of left lower limb: Secondary | ICD-10-CM | POA: Diagnosis not present

## 2018-11-16 DIAGNOSIS — T8142XA Infection following a procedure, deep incisional surgical site, initial encounter: Secondary | ICD-10-CM | POA: Diagnosis not present

## 2018-11-16 DIAGNOSIS — E1069 Type 1 diabetes mellitus with other specified complication: Secondary | ICD-10-CM | POA: Diagnosis not present

## 2018-11-16 DIAGNOSIS — N186 End stage renal disease: Secondary | ICD-10-CM | POA: Diagnosis not present

## 2018-11-16 DIAGNOSIS — D509 Iron deficiency anemia, unspecified: Secondary | ICD-10-CM | POA: Diagnosis not present

## 2018-11-16 DIAGNOSIS — E1022 Type 1 diabetes mellitus with diabetic chronic kidney disease: Secondary | ICD-10-CM | POA: Diagnosis not present

## 2018-11-16 DIAGNOSIS — L97424 Non-pressure chronic ulcer of left heel and midfoot with necrosis of bone: Secondary | ICD-10-CM | POA: Diagnosis not present

## 2018-11-16 DIAGNOSIS — M86172 Other acute osteomyelitis, left ankle and foot: Secondary | ICD-10-CM | POA: Diagnosis not present

## 2018-11-16 DIAGNOSIS — M869 Osteomyelitis, unspecified: Secondary | ICD-10-CM | POA: Diagnosis not present

## 2018-11-16 DIAGNOSIS — E10621 Type 1 diabetes mellitus with foot ulcer: Secondary | ICD-10-CM | POA: Diagnosis not present

## 2018-11-16 DIAGNOSIS — R739 Hyperglycemia, unspecified: Secondary | ICD-10-CM | POA: Diagnosis not present

## 2018-11-16 DIAGNOSIS — D631 Anemia in chronic kidney disease: Secondary | ICD-10-CM | POA: Diagnosis not present

## 2018-11-16 NOTE — Telephone Encounter (Signed)
Pt left message asking the status of his diabetic shoes but did not leave a dob or phone number.  I checked status in safestep and his doctor has not sent paperwork back to [place]. He stated he told someone to send it to Dr [name]. I told him I would change it and get it sent to him. I also asked pt when he calls to please leave me a number or dob since there are so many pts with his name in [place].

## 2018-11-17 ENCOUNTER — Ambulatory Visit (INDEPENDENT_AMBULATORY_CARE_PROVIDER_SITE_OTHER): Payer: Medicare Other | Admitting: Podiatry

## 2018-11-17 DIAGNOSIS — E08621 Diabetes mellitus due to underlying condition with foot ulcer: Secondary | ICD-10-CM

## 2018-11-17 DIAGNOSIS — L97423 Non-pressure chronic ulcer of left heel and midfoot with necrosis of muscle: Secondary | ICD-10-CM

## 2018-11-18 DIAGNOSIS — L97424 Non-pressure chronic ulcer of left heel and midfoot with necrosis of bone: Secondary | ICD-10-CM | POA: Diagnosis not present

## 2018-11-18 DIAGNOSIS — E1069 Type 1 diabetes mellitus with other specified complication: Secondary | ICD-10-CM | POA: Diagnosis not present

## 2018-11-18 DIAGNOSIS — D509 Iron deficiency anemia, unspecified: Secondary | ICD-10-CM | POA: Diagnosis not present

## 2018-11-18 DIAGNOSIS — E10621 Type 1 diabetes mellitus with foot ulcer: Secondary | ICD-10-CM | POA: Diagnosis not present

## 2018-11-18 DIAGNOSIS — T8142XA Infection following a procedure, deep incisional surgical site, initial encounter: Secondary | ICD-10-CM | POA: Diagnosis not present

## 2018-11-18 DIAGNOSIS — L03116 Cellulitis of left lower limb: Secondary | ICD-10-CM | POA: Diagnosis not present

## 2018-11-18 DIAGNOSIS — M869 Osteomyelitis, unspecified: Secondary | ICD-10-CM | POA: Diagnosis not present

## 2018-11-18 DIAGNOSIS — N186 End stage renal disease: Secondary | ICD-10-CM | POA: Diagnosis not present

## 2018-11-18 DIAGNOSIS — R739 Hyperglycemia, unspecified: Secondary | ICD-10-CM | POA: Diagnosis not present

## 2018-11-18 DIAGNOSIS — M86172 Other acute osteomyelitis, left ankle and foot: Secondary | ICD-10-CM | POA: Diagnosis not present

## 2018-11-18 DIAGNOSIS — E1022 Type 1 diabetes mellitus with diabetic chronic kidney disease: Secondary | ICD-10-CM | POA: Diagnosis not present

## 2018-11-18 DIAGNOSIS — D631 Anemia in chronic kidney disease: Secondary | ICD-10-CM | POA: Diagnosis not present

## 2018-11-21 DIAGNOSIS — M86172 Other acute osteomyelitis, left ankle and foot: Secondary | ICD-10-CM | POA: Diagnosis not present

## 2018-11-21 DIAGNOSIS — E1069 Type 1 diabetes mellitus with other specified complication: Secondary | ICD-10-CM | POA: Diagnosis not present

## 2018-11-21 DIAGNOSIS — L03116 Cellulitis of left lower limb: Secondary | ICD-10-CM | POA: Diagnosis not present

## 2018-11-21 DIAGNOSIS — T8142XA Infection following a procedure, deep incisional surgical site, initial encounter: Secondary | ICD-10-CM | POA: Diagnosis not present

## 2018-11-21 DIAGNOSIS — E10621 Type 1 diabetes mellitus with foot ulcer: Secondary | ICD-10-CM | POA: Diagnosis not present

## 2018-11-21 DIAGNOSIS — L97424 Non-pressure chronic ulcer of left heel and midfoot with necrosis of bone: Secondary | ICD-10-CM | POA: Diagnosis not present

## 2018-11-22 DIAGNOSIS — D509 Iron deficiency anemia, unspecified: Secondary | ICD-10-CM | POA: Diagnosis not present

## 2018-11-22 DIAGNOSIS — R739 Hyperglycemia, unspecified: Secondary | ICD-10-CM | POA: Diagnosis not present

## 2018-11-22 DIAGNOSIS — D631 Anemia in chronic kidney disease: Secondary | ICD-10-CM | POA: Diagnosis not present

## 2018-11-22 DIAGNOSIS — N186 End stage renal disease: Secondary | ICD-10-CM | POA: Diagnosis not present

## 2018-11-22 DIAGNOSIS — M869 Osteomyelitis, unspecified: Secondary | ICD-10-CM | POA: Diagnosis not present

## 2018-11-22 DIAGNOSIS — E1022 Type 1 diabetes mellitus with diabetic chronic kidney disease: Secondary | ICD-10-CM | POA: Diagnosis not present

## 2018-11-23 DIAGNOSIS — M869 Osteomyelitis, unspecified: Secondary | ICD-10-CM | POA: Diagnosis not present

## 2018-11-23 DIAGNOSIS — T8142XA Infection following a procedure, deep incisional surgical site, initial encounter: Secondary | ICD-10-CM | POA: Diagnosis not present

## 2018-11-23 DIAGNOSIS — D631 Anemia in chronic kidney disease: Secondary | ICD-10-CM | POA: Diagnosis not present

## 2018-11-23 DIAGNOSIS — M86172 Other acute osteomyelitis, left ankle and foot: Secondary | ICD-10-CM | POA: Diagnosis not present

## 2018-11-23 DIAGNOSIS — L97424 Non-pressure chronic ulcer of left heel and midfoot with necrosis of bone: Secondary | ICD-10-CM | POA: Diagnosis not present

## 2018-11-23 DIAGNOSIS — E1069 Type 1 diabetes mellitus with other specified complication: Secondary | ICD-10-CM | POA: Diagnosis not present

## 2018-11-23 DIAGNOSIS — E10621 Type 1 diabetes mellitus with foot ulcer: Secondary | ICD-10-CM | POA: Diagnosis not present

## 2018-11-23 DIAGNOSIS — R739 Hyperglycemia, unspecified: Secondary | ICD-10-CM | POA: Diagnosis not present

## 2018-11-23 DIAGNOSIS — E1022 Type 1 diabetes mellitus with diabetic chronic kidney disease: Secondary | ICD-10-CM | POA: Diagnosis not present

## 2018-11-23 DIAGNOSIS — D509 Iron deficiency anemia, unspecified: Secondary | ICD-10-CM | POA: Diagnosis not present

## 2018-11-23 DIAGNOSIS — L03116 Cellulitis of left lower limb: Secondary | ICD-10-CM | POA: Diagnosis not present

## 2018-11-23 DIAGNOSIS — N186 End stage renal disease: Secondary | ICD-10-CM | POA: Diagnosis not present

## 2018-11-24 ENCOUNTER — Telehealth: Payer: Self-pay | Admitting: *Deleted

## 2018-11-24 ENCOUNTER — Ambulatory Visit (INDEPENDENT_AMBULATORY_CARE_PROVIDER_SITE_OTHER): Payer: Medicare Other | Admitting: Podiatry

## 2018-11-24 DIAGNOSIS — E08621 Diabetes mellitus due to underlying condition with foot ulcer: Secondary | ICD-10-CM

## 2018-11-24 DIAGNOSIS — L97423 Non-pressure chronic ulcer of left heel and midfoot with necrosis of muscle: Secondary | ICD-10-CM

## 2018-11-24 DIAGNOSIS — Z9889 Other specified postprocedural states: Secondary | ICD-10-CM

## 2018-11-24 MED ORDER — COLLAGENASE 250 UNIT/GM EX OINT: 1.0000 "application " | TOPICAL_OINTMENT | Freq: Every day | CUTANEOUS | 5 refills | Status: DC

## 2018-11-24 NOTE — Progress Notes (Signed)
Subjective:  Patient ID: [name], male    DOB: 1981-09-24,  MRN: [id number]  No chief complaint on file.  38 y.o. male presents for wound care follow-up.  Denies new issues.  Review of Systems: Negative except as noted in the HPI. Denies N/V/F/Ch.  Past Medical History:  Diagnosis Date  . Diabetes mellitus without complication ([place])   . Diabetic gastroparesis ([place])   . Dialysis patient ([place])   . Hypertension   . Renal disorder    Dialysis  . Sepsis ([place])     Current Outpatient Medications:  .  calcitRIOL (ROCALTROL) 0.5 MCG capsule, Take 5 capsules (2.5 mcg total) by mouth every Monday, Wednesday, and Friday with hemodialysis., Disp: 30 capsule, Rfl: 0 .  ceFAZolin (ANCEF) 2-4 GM/100ML-% IVPB, Inject 100 mLs (2 g total) into the vein every Monday, Wednesday, and Friday with hemodialysis., Disp: 1 each, Rfl: 7 .  cinacalcet (SENSIPAR) 30 MG tablet, Take 30 mg by mouth daily with supper., Disp: , Rfl:  .  clonazePAM (KLONOPIN) 0.5 MG tablet, Take 0.5 mg by mouth daily as needed for anxiety (sleep). , Disp: , Rfl:  .  collagenase (SANTYL) ointment, Apply 1 application topically daily., Disp: 15 g, Rfl: 0 .  glucose blood (KROGER TEST STRIPS) test strip, by Other route 4 (four) times a day as needed., Disp: , Rfl:  .  HYDROcodone-acetaminophen (NORCO/VICODIN) 5-325 MG tablet, Take 1-2 tablets by mouth every 6 (six) hours as needed for moderate pain or severe pain., Disp: 20 tablet, Rfl: 0 .  insulin aspart (NOVOLOG) 100 UNIT/ML injection, Inject 6-7 Units into the skin 2 (two) times daily with a meal. , Disp: , Rfl:  .  insulin glargine (LANTUS) 100 UNIT/ML injection, Inject 0.15 mLs (15 Units total) into the skin at bedtime., Disp: 10 mL, Rfl: 1 .  multivitamin (RENA-VIT) TABS tablet, Take 1 tablet by mouth at bedtime., Disp: 30 tablet, Rfl: 0 .  ondansetron (ZOFRAN ODT) 4 MG disintegrating tablet, Take 1 tablet (4 mg total) by mouth every 8 (eight) hours as needed for nausea or  vomiting., Disp: 10 tablet, Rfl: 0 .  sevelamer carbonate (RENVELA) 800 MG tablet, Take 2,400-3,200 mg by mouth See admin instructions. Take 3 - 4 tablets (2400 mg -3200 mg) by mouth two times daily with meals, Disp: , Rfl:   Social History   Tobacco Use  Smoking Status Never Smoker  Smokeless Tobacco Never Used    No Known Allergies Objective:  There were no vitals filed for this visit. There is no height or weight on file to calculate BMI. Constitutional Well developed.  Vascular Left foot warm to touch  Neurologic Oriented to person, place, and time. Epicritic sensation to light touch grossly absent bilaterally.  Dermatologic  5x4 wound Left 1st metatarsal with hypogranular base.  No warmth erythema signs of acute infection.  No exposed bone  Orthopedic: Left hallux and fifth toe amputations noted.   Radiographs: None Assessment:   1. 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  S/p L I&D of Abscess, Hallux Amputation, with debridement and wound subsequent partial closure -Wound continues to heal albeit slowly.  Fibrogranular base today. -At this point I do not think he needs to continue the wound VAC.  Continue Santyl wet-to-dry dressing with assistance of home health care.  Santyl refilled.   Return in about  1 week (around 11/24/2018).

## 2018-11-24 NOTE — Telephone Encounter (Signed)
Dr. March Rummage ordered refill Santyl for left 1st metatarsal amputation site. Orders to [facility].

## 2018-11-24 NOTE — Progress Notes (Signed)
  Subjective:  Patient ID: [name], male    DOB: Nov 19, 1980,  MRN: [id number]  No chief complaint on file.  38 y.o. male presents for wound care follow-up. Thinks he needs a refill of santyl.  Review of Systems: Negative except as noted in the HPI. Denies N/V/F/Ch.  Past Medical History:  Diagnosis Date  . Diabetes mellitus without complication ([place])   . Diabetic gastroparesis ([place])   . Dialysis patient ([place])   . Hypertension   . Renal disorder    Dialysis  . Sepsis ([place])     Current Outpatient Medications:  .  calcitRIOL (ROCALTROL) 0.5 MCG capsule, Take 5 capsules (2.5 mcg total) by mouth every Monday, Wednesday, and Friday with hemodialysis., Disp: 30 capsule, Rfl: 0 .  ceFAZolin (ANCEF) 2-4 GM/100ML-% IVPB, Inject 100 mLs (2 g total) into the vein every Monday, Wednesday, and Friday with hemodialysis., Disp: 1 each, Rfl: 7 .  cinacalcet (SENSIPAR) 30 MG tablet, Take 30 mg by mouth daily with supper., Disp: , Rfl:  .  clonazePAM (KLONOPIN) 0.5 MG tablet, Take 0.5 mg by mouth daily as needed for anxiety (sleep). , Disp: , Rfl:  .  collagenase (SANTYL) ointment, Apply 1 application topically daily., Disp: 15 g, Rfl: 0 .  glucose blood (KROGER TEST STRIPS) test strip, by Other route 4 (four) times a day as needed., Disp: , Rfl:  .  HYDROcodone-acetaminophen (NORCO/VICODIN) 5-325 MG tablet, Take 1-2 tablets by mouth every 6 (six) hours as needed for moderate pain or severe pain., Disp: 20 tablet, Rfl: 0 .  insulin aspart (NOVOLOG) 100 UNIT/ML injection, Inject 6-7 Units into the skin 2 (two) times daily with a meal. , Disp: , Rfl:  .  insulin glargine (LANTUS) 100 UNIT/ML injection, Inject 0.15 mLs (15 Units total) into the skin at bedtime., Disp: 10 mL, Rfl: 1 .  multivitamin (RENA-VIT) TABS tablet, Take 1 tablet by mouth at bedtime., Disp: 30 tablet, Rfl: 0 .  ondansetron (ZOFRAN ODT) 4 MG disintegrating tablet, Take 1 tablet (4 mg total) by mouth every 8 (eight) hours as needed  for nausea or vomiting., Disp: 10 tablet, Rfl: 0 .  sevelamer carbonate (RENVELA) 800 MG tablet, Take 2,400-3,200 mg by mouth See admin instructions. Take 3 - 4 tablets (2400 mg -3200 mg) by mouth two times daily with meals, Disp: , Rfl:   Social History   Tobacco Use  Smoking Status Never Smoker  Smokeless Tobacco Never Used    No Known Allergies Objective:  There were no vitals filed for this visit. There is no height or weight on file to calculate BMI. Constitutional Well developed.  Vascular Left foot warm to touch  Neurologic Oriented to person, place, and time. Epicritic sensation to light touch grossly absent bilaterally.  Dermatologic  4x4 wound Left 1st metatarsal with hypogranular base.  No warmth erythema signs of acute infection.  No exposed bone  Orthopedic: Left hallux and fifth toe amputations noted.   Radiographs: None Assessment:   1. Post-operative state   2. 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  S/p L I&D of Abscess, Hallux Amputation, with debridement and wound subsequent partial closure -Continue santyl. -Will reorder -Swelling noted to the 2nd toe. Will continue to monitor. -Surveillance XR at next visit.   Return in about 1 week (around 12/01/2018).

## 2018-11-25 DIAGNOSIS — D509 Iron deficiency anemia, unspecified: Secondary | ICD-10-CM | POA: Diagnosis not present

## 2018-11-25 DIAGNOSIS — E10621 Type 1 diabetes mellitus with foot ulcer: Secondary | ICD-10-CM | POA: Diagnosis not present

## 2018-11-25 DIAGNOSIS — L97424 Non-pressure chronic ulcer of left heel and midfoot with necrosis of bone: Secondary | ICD-10-CM | POA: Diagnosis not present

## 2018-11-25 DIAGNOSIS — L03116 Cellulitis of left lower limb: Secondary | ICD-10-CM | POA: Diagnosis not present

## 2018-11-25 DIAGNOSIS — M869 Osteomyelitis, unspecified: Secondary | ICD-10-CM | POA: Diagnosis not present

## 2018-11-25 DIAGNOSIS — R739 Hyperglycemia, unspecified: Secondary | ICD-10-CM | POA: Diagnosis not present

## 2018-11-25 DIAGNOSIS — M86172 Other acute osteomyelitis, left ankle and foot: Secondary | ICD-10-CM | POA: Diagnosis not present

## 2018-11-25 DIAGNOSIS — N186 End stage renal disease: Secondary | ICD-10-CM | POA: Diagnosis not present

## 2018-11-25 DIAGNOSIS — E1069 Type 1 diabetes mellitus with other specified complication: Secondary | ICD-10-CM | POA: Diagnosis not present

## 2018-11-25 DIAGNOSIS — E1022 Type 1 diabetes mellitus with diabetic chronic kidney disease: Secondary | ICD-10-CM | POA: Diagnosis not present

## 2018-11-25 DIAGNOSIS — D631 Anemia in chronic kidney disease: Secondary | ICD-10-CM | POA: Diagnosis not present

## 2018-11-25 DIAGNOSIS — T8142XA Infection following a procedure, deep incisional surgical site, initial encounter: Secondary | ICD-10-CM | POA: Diagnosis not present

## 2018-11-27 DIAGNOSIS — Z992 Dependence on renal dialysis: Secondary | ICD-10-CM | POA: Diagnosis not present

## 2018-11-27 DIAGNOSIS — I129 Hypertensive chronic kidney disease with stage 1 through stage 4 chronic kidney disease, or unspecified chronic kidney disease: Secondary | ICD-10-CM | POA: Diagnosis not present

## 2018-11-27 DIAGNOSIS — N186 End stage renal disease: Secondary | ICD-10-CM | POA: Diagnosis not present

## 2018-11-28 ENCOUNTER — Telehealth: Payer: Self-pay | Admitting: Podiatry

## 2018-11-28 DIAGNOSIS — E1069 Type 1 diabetes mellitus with other specified complication: Secondary | ICD-10-CM | POA: Diagnosis not present

## 2018-11-28 DIAGNOSIS — E10621 Type 1 diabetes mellitus with foot ulcer: Secondary | ICD-10-CM | POA: Diagnosis not present

## 2018-11-28 DIAGNOSIS — M86172 Other acute osteomyelitis, left ankle and foot: Secondary | ICD-10-CM | POA: Diagnosis not present

## 2018-11-28 DIAGNOSIS — L97424 Non-pressure chronic ulcer of left heel and midfoot with necrosis of bone: Secondary | ICD-10-CM | POA: Diagnosis not present

## 2018-11-28 DIAGNOSIS — T8142XA Infection following a procedure, deep incisional surgical site, initial encounter: Secondary | ICD-10-CM | POA: Diagnosis not present

## 2018-11-28 DIAGNOSIS — L03116 Cellulitis of left lower limb: Secondary | ICD-10-CM | POA: Diagnosis not present

## 2018-11-28 NOTE — Telephone Encounter (Signed)
I actually would still prefer the santyl 3x a week than the medihoney.

## 2018-11-28 NOTE — Telephone Encounter (Signed)
I spoke with Phebe Colla, RN and informed that the Wound Vac had been halted and santyl to the areas, to have pt bring the wound vac to the next appt. [name], RN states [place] wound care nurse states santyl is to be changed every day, but pt's insurance will not cover every day visits, medihoney would be better for the 3 time a week. I told [name], RN I would inform Dr. March Rummage.

## 2018-11-28 NOTE — Telephone Encounter (Signed)
Just calling to see if Dr. March Rummage thought [name] needs the wound vac?

## 2018-11-29 DIAGNOSIS — E1022 Type 1 diabetes mellitus with diabetic chronic kidney disease: Secondary | ICD-10-CM | POA: Diagnosis not present

## 2018-11-29 DIAGNOSIS — N2581 Secondary hyperparathyroidism of renal origin: Secondary | ICD-10-CM | POA: Diagnosis not present

## 2018-11-29 DIAGNOSIS — D509 Iron deficiency anemia, unspecified: Secondary | ICD-10-CM | POA: Diagnosis not present

## 2018-11-29 DIAGNOSIS — N186 End stage renal disease: Secondary | ICD-10-CM | POA: Diagnosis not present

## 2018-11-29 DIAGNOSIS — D631 Anemia in chronic kidney disease: Secondary | ICD-10-CM | POA: Diagnosis not present

## 2018-11-30 DIAGNOSIS — E1022 Type 1 diabetes mellitus with diabetic chronic kidney disease: Secondary | ICD-10-CM | POA: Diagnosis not present

## 2018-11-30 DIAGNOSIS — E1069 Type 1 diabetes mellitus with other specified complication: Secondary | ICD-10-CM | POA: Diagnosis not present

## 2018-11-30 DIAGNOSIS — D631 Anemia in chronic kidney disease: Secondary | ICD-10-CM | POA: Diagnosis not present

## 2018-11-30 DIAGNOSIS — L97424 Non-pressure chronic ulcer of left heel and midfoot with necrosis of bone: Secondary | ICD-10-CM | POA: Diagnosis not present

## 2018-11-30 DIAGNOSIS — N186 End stage renal disease: Secondary | ICD-10-CM | POA: Diagnosis not present

## 2018-11-30 DIAGNOSIS — T8142XA Infection following a procedure, deep incisional surgical site, initial encounter: Secondary | ICD-10-CM | POA: Diagnosis not present

## 2018-11-30 DIAGNOSIS — N2581 Secondary hyperparathyroidism of renal origin: Secondary | ICD-10-CM | POA: Diagnosis not present

## 2018-11-30 DIAGNOSIS — D509 Iron deficiency anemia, unspecified: Secondary | ICD-10-CM | POA: Diagnosis not present

## 2018-11-30 DIAGNOSIS — E10621 Type 1 diabetes mellitus with foot ulcer: Secondary | ICD-10-CM | POA: Diagnosis not present

## 2018-11-30 DIAGNOSIS — M86172 Other acute osteomyelitis, left ankle and foot: Secondary | ICD-10-CM | POA: Diagnosis not present

## 2018-11-30 DIAGNOSIS — L03116 Cellulitis of left lower limb: Secondary | ICD-10-CM | POA: Diagnosis not present

## 2018-12-01 ENCOUNTER — Ambulatory Visit (INDEPENDENT_AMBULATORY_CARE_PROVIDER_SITE_OTHER): Payer: Medicare Other | Admitting: Podiatry

## 2018-12-01 ENCOUNTER — Encounter: Payer: Self-pay | Admitting: Podiatry

## 2018-12-01 DIAGNOSIS — Z9889 Other specified postprocedural states: Secondary | ICD-10-CM

## 2018-12-01 DIAGNOSIS — E08621 Diabetes mellitus due to underlying condition with foot ulcer: Secondary | ICD-10-CM

## 2018-12-01 DIAGNOSIS — L97423 Non-pressure chronic ulcer of left heel and midfoot with necrosis of muscle: Secondary | ICD-10-CM

## 2018-12-01 NOTE — Progress Notes (Signed)
Subjective:  Patient ID: [name], male    DOB: May 08, 1981,  MRN: [id number]  Chief Complaint  Patient presents with  . Wound Check    L-follow up; "doing alright; no concerns"   38 y.o. male presents for wound care follow-up.  Doing okay denies concerns with his foot.  Review of Systems: Negative except as noted in the HPI. Denies N/V/F/Ch.  Past Medical History:  Diagnosis Date  . Diabetes mellitus without complication ([place])   . Diabetic gastroparesis ([place])   . Dialysis patient ([place])   . Hypertension   . Renal disorder    Dialysis  . Sepsis ([place])     Current Outpatient Medications:  .  calcitRIOL (ROCALTROL) 0.5 MCG capsule, Take 5 capsules (2.5 mcg total) by mouth every Monday, Wednesday, and Friday with hemodialysis., Disp: 30 capsule, Rfl: 0 .  ceFAZolin (ANCEF) 2-4 GM/100ML-% IVPB, Inject 100 mLs (2 g total) into the vein every Monday, Wednesday, and Friday with hemodialysis., Disp: 1 each, Rfl: 7 .  cinacalcet (SENSIPAR) 30 MG tablet, Take 30 mg by mouth daily with supper., Disp: , Rfl:  .  clonazePAM (KLONOPIN) 0.5 MG tablet, Take 0.5 mg by mouth daily as needed for anxiety (sleep). , Disp: , Rfl:  .  collagenase (SANTYL) ointment, Apply 1 application topically daily., Disp: 15 g, Rfl: 0 .  collagenase (SANTYL) ointment, Apply 1 application topically daily. Left 1st metatarsal amputation site measuring 4.0 x 4.0 x 0.5cm, Disp: 15 g, Rfl: 5 .  glucose blood (KROGER TEST STRIPS) test strip, by Other route 4 (four) times a day as needed., Disp: , Rfl:  .  HYDROcodone-acetaminophen (NORCO/VICODIN) 5-325 MG tablet, Take 1-2 tablets by mouth every 6 (six) hours as needed for moderate pain or severe pain., Disp: 20 tablet, Rfl: 0 .  insulin aspart (NOVOLOG) 100 UNIT/ML injection, Inject 6-7 Units into the skin 2 (two) times daily with a meal. , Disp: , Rfl:  .  insulin glargine (LANTUS) 100 UNIT/ML injection, Inject 0.15 mLs (15 Units total) into the skin at bedtime., Disp:  10 mL, Rfl: 1 .  multivitamin (RENA-VIT) TABS tablet, Take 1 tablet by mouth at bedtime., Disp: 30 tablet, Rfl: 0 .  ondansetron (ZOFRAN ODT) 4 MG disintegrating tablet, Take 1 tablet (4 mg total) by mouth every 8 (eight) hours as needed for nausea or vomiting., Disp: 10 tablet, Rfl: 0 .  sevelamer carbonate (RENVELA) 800 MG tablet, Take 2,400-3,200 mg by mouth See admin instructions. Take 3 - 4 tablets (2400 mg -3200 mg) by mouth two times daily with meals, Disp: , Rfl:   Social History   Tobacco Use  Smoking Status Never Smoker  Smokeless Tobacco Never Used    No Known Allergies Objective:  There were no vitals filed for this visit. There is no height or weight on file to calculate BMI. Constitutional Well developed.  Vascular Left foot warm to touch  Neurologic Oriented to person, place, and time. Epicritic sensation to light touch grossly absent bilaterally.  Dermatologic 3x3.5 wound Left 1st metatarsal with fibrogranular base.  No warmth erythema signs of acute infection.  No exposed bone  Orthopedic: Left hallux and fifth toe amputations noted.   Radiographs: None Assessment:   1. 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 Post-operative state    Plan:  Patient was evaluated and treated and all questions answered.  S/p L I&D of Abscess, Hallux Amputation, with  debridement and wound subsequent partial closure -Continue santyl. -Minimal debridement of hyperkeratotic rim. -Surveillance XR at next visit.   No follow-ups on file.

## 2018-12-02 DIAGNOSIS — D509 Iron deficiency anemia, unspecified: Secondary | ICD-10-CM | POA: Diagnosis not present

## 2018-12-02 DIAGNOSIS — E1022 Type 1 diabetes mellitus with diabetic chronic kidney disease: Secondary | ICD-10-CM | POA: Diagnosis not present

## 2018-12-02 DIAGNOSIS — N2581 Secondary hyperparathyroidism of renal origin: Secondary | ICD-10-CM | POA: Diagnosis not present

## 2018-12-02 DIAGNOSIS — N186 End stage renal disease: Secondary | ICD-10-CM | POA: Diagnosis not present

## 2018-12-02 DIAGNOSIS — T8142XA Infection following a procedure, deep incisional surgical site, initial encounter: Secondary | ICD-10-CM | POA: Diagnosis not present

## 2018-12-02 DIAGNOSIS — M86172 Other acute osteomyelitis, left ankle and foot: Secondary | ICD-10-CM | POA: Diagnosis not present

## 2018-12-02 DIAGNOSIS — D631 Anemia in chronic kidney disease: Secondary | ICD-10-CM | POA: Diagnosis not present

## 2018-12-02 DIAGNOSIS — E1069 Type 1 diabetes mellitus with other specified complication: Secondary | ICD-10-CM | POA: Diagnosis not present

## 2018-12-02 DIAGNOSIS — L97424 Non-pressure chronic ulcer of left heel and midfoot with necrosis of bone: Secondary | ICD-10-CM | POA: Diagnosis not present

## 2018-12-02 DIAGNOSIS — L03116 Cellulitis of left lower limb: Secondary | ICD-10-CM | POA: Diagnosis not present

## 2018-12-02 DIAGNOSIS — E10621 Type 1 diabetes mellitus with foot ulcer: Secondary | ICD-10-CM | POA: Diagnosis not present

## 2018-12-05 DIAGNOSIS — N2581 Secondary hyperparathyroidism of renal origin: Secondary | ICD-10-CM | POA: Diagnosis not present

## 2018-12-05 DIAGNOSIS — L03116 Cellulitis of left lower limb: Secondary | ICD-10-CM | POA: Diagnosis not present

## 2018-12-05 DIAGNOSIS — L97424 Non-pressure chronic ulcer of left heel and midfoot with necrosis of bone: Secondary | ICD-10-CM | POA: Diagnosis not present

## 2018-12-05 DIAGNOSIS — E10621 Type 1 diabetes mellitus with foot ulcer: Secondary | ICD-10-CM | POA: Diagnosis not present

## 2018-12-05 DIAGNOSIS — D631 Anemia in chronic kidney disease: Secondary | ICD-10-CM | POA: Diagnosis not present

## 2018-12-05 DIAGNOSIS — D509 Iron deficiency anemia, unspecified: Secondary | ICD-10-CM | POA: Diagnosis not present

## 2018-12-05 DIAGNOSIS — E1069 Type 1 diabetes mellitus with other specified complication: Secondary | ICD-10-CM | POA: Diagnosis not present

## 2018-12-05 DIAGNOSIS — N186 End stage renal disease: Secondary | ICD-10-CM | POA: Diagnosis not present

## 2018-12-05 DIAGNOSIS — T8142XA Infection following a procedure, deep incisional surgical site, initial encounter: Secondary | ICD-10-CM | POA: Diagnosis not present

## 2018-12-05 DIAGNOSIS — E1022 Type 1 diabetes mellitus with diabetic chronic kidney disease: Secondary | ICD-10-CM | POA: Diagnosis not present

## 2018-12-05 DIAGNOSIS — M86172 Other acute osteomyelitis, left ankle and foot: Secondary | ICD-10-CM | POA: Diagnosis not present

## 2018-12-07 DIAGNOSIS — Z4800 Encounter for change or removal of nonsurgical wound dressing: Secondary | ICD-10-CM | POA: Diagnosis not present

## 2018-12-07 DIAGNOSIS — Z992 Dependence on renal dialysis: Secondary | ICD-10-CM | POA: Diagnosis not present

## 2018-12-07 DIAGNOSIS — Z8631 Personal history of diabetic foot ulcer: Secondary | ICD-10-CM | POA: Diagnosis not present

## 2018-12-07 DIAGNOSIS — E1122 Type 2 diabetes mellitus with diabetic chronic kidney disease: Secondary | ICD-10-CM | POA: Diagnosis not present

## 2018-12-07 DIAGNOSIS — E1022 Type 1 diabetes mellitus with diabetic chronic kidney disease: Secondary | ICD-10-CM | POA: Diagnosis not present

## 2018-12-07 DIAGNOSIS — E1142 Type 2 diabetes mellitus with diabetic polyneuropathy: Secondary | ICD-10-CM | POA: Diagnosis not present

## 2018-12-07 DIAGNOSIS — L97424 Non-pressure chronic ulcer of left heel and midfoot with necrosis of bone: Secondary | ICD-10-CM | POA: Diagnosis not present

## 2018-12-07 DIAGNOSIS — D631 Anemia in chronic kidney disease: Secondary | ICD-10-CM | POA: Diagnosis not present

## 2018-12-07 DIAGNOSIS — N2581 Secondary hyperparathyroidism of renal origin: Secondary | ICD-10-CM | POA: Diagnosis not present

## 2018-12-07 DIAGNOSIS — T8142XA Infection following a procedure, deep incisional surgical site, initial encounter: Secondary | ICD-10-CM | POA: Diagnosis not present

## 2018-12-07 DIAGNOSIS — I120 Hypertensive chronic kidney disease with stage 5 chronic kidney disease or end stage renal disease: Secondary | ICD-10-CM | POA: Diagnosis not present

## 2018-12-07 DIAGNOSIS — D509 Iron deficiency anemia, unspecified: Secondary | ICD-10-CM | POA: Diagnosis not present

## 2018-12-07 DIAGNOSIS — L03116 Cellulitis of left lower limb: Secondary | ICD-10-CM | POA: Diagnosis not present

## 2018-12-07 DIAGNOSIS — E10621 Type 1 diabetes mellitus with foot ulcer: Secondary | ICD-10-CM | POA: Diagnosis not present

## 2018-12-07 DIAGNOSIS — E1069 Type 1 diabetes mellitus with other specified complication: Secondary | ICD-10-CM | POA: Diagnosis not present

## 2018-12-07 DIAGNOSIS — Z792 Long term (current) use of antibiotics: Secondary | ICD-10-CM | POA: Diagnosis not present

## 2018-12-07 DIAGNOSIS — B9561 Methicillin susceptible Staphylococcus aureus infection as the cause of diseases classified elsewhere: Secondary | ICD-10-CM | POA: Diagnosis not present

## 2018-12-07 DIAGNOSIS — N186 End stage renal disease: Secondary | ICD-10-CM | POA: Diagnosis not present

## 2018-12-07 DIAGNOSIS — M86172 Other acute osteomyelitis, left ankle and foot: Secondary | ICD-10-CM | POA: Diagnosis not present

## 2018-12-07 DIAGNOSIS — E1065 Type 1 diabetes mellitus with hyperglycemia: Secondary | ICD-10-CM | POA: Diagnosis not present

## 2018-12-08 ENCOUNTER — Ambulatory Visit (INDEPENDENT_AMBULATORY_CARE_PROVIDER_SITE_OTHER): Payer: Medicare Other

## 2018-12-08 ENCOUNTER — Other Ambulatory Visit: Payer: Self-pay

## 2018-12-08 ENCOUNTER — Ambulatory Visit (INDEPENDENT_AMBULATORY_CARE_PROVIDER_SITE_OTHER): Payer: Medicare Other | Admitting: Podiatry

## 2018-12-08 ENCOUNTER — Encounter: Payer: Self-pay | Admitting: Podiatry

## 2018-12-08 VITALS — BP 206/183 | HR 80 | Temp 98.0°F | Resp 16

## 2018-12-08 DIAGNOSIS — L97522 Non-pressure chronic ulcer of other part of left foot with fat layer exposed: Secondary | ICD-10-CM

## 2018-12-08 DIAGNOSIS — Z9889 Other specified postprocedural states: Secondary | ICD-10-CM

## 2018-12-08 DIAGNOSIS — I739 Peripheral vascular disease, unspecified: Secondary | ICD-10-CM

## 2018-12-08 DIAGNOSIS — L97421 Non-pressure chronic ulcer of left heel and midfoot limited to breakdown of skin: Secondary | ICD-10-CM

## 2018-12-08 DIAGNOSIS — L03116 Cellulitis of left lower limb: Secondary | ICD-10-CM

## 2018-12-08 DIAGNOSIS — L97423 Non-pressure chronic ulcer of left heel and midfoot with necrosis of muscle: Secondary | ICD-10-CM

## 2018-12-08 DIAGNOSIS — E08621 Diabetes mellitus due to underlying condition with foot ulcer: Secondary | ICD-10-CM

## 2018-12-09 DIAGNOSIS — M86172 Other acute osteomyelitis, left ankle and foot: Secondary | ICD-10-CM | POA: Diagnosis not present

## 2018-12-09 DIAGNOSIS — E1069 Type 1 diabetes mellitus with other specified complication: Secondary | ICD-10-CM | POA: Diagnosis not present

## 2018-12-09 DIAGNOSIS — E1022 Type 1 diabetes mellitus with diabetic chronic kidney disease: Secondary | ICD-10-CM | POA: Diagnosis not present

## 2018-12-09 DIAGNOSIS — N2581 Secondary hyperparathyroidism of renal origin: Secondary | ICD-10-CM | POA: Diagnosis not present

## 2018-12-09 DIAGNOSIS — E10621 Type 1 diabetes mellitus with foot ulcer: Secondary | ICD-10-CM | POA: Diagnosis not present

## 2018-12-09 DIAGNOSIS — D509 Iron deficiency anemia, unspecified: Secondary | ICD-10-CM | POA: Diagnosis not present

## 2018-12-09 DIAGNOSIS — L03116 Cellulitis of left lower limb: Secondary | ICD-10-CM | POA: Diagnosis not present

## 2018-12-09 DIAGNOSIS — T8142XA Infection following a procedure, deep incisional surgical site, initial encounter: Secondary | ICD-10-CM | POA: Diagnosis not present

## 2018-12-09 DIAGNOSIS — N186 End stage renal disease: Secondary | ICD-10-CM | POA: Diagnosis not present

## 2018-12-09 DIAGNOSIS — D631 Anemia in chronic kidney disease: Secondary | ICD-10-CM | POA: Diagnosis not present

## 2018-12-09 DIAGNOSIS — L97424 Non-pressure chronic ulcer of left heel and midfoot with necrosis of bone: Secondary | ICD-10-CM | POA: Diagnosis not present

## 2018-12-12 DIAGNOSIS — T8142XA Infection following a procedure, deep incisional surgical site, initial encounter: Secondary | ICD-10-CM | POA: Diagnosis not present

## 2018-12-12 DIAGNOSIS — N186 End stage renal disease: Secondary | ICD-10-CM | POA: Diagnosis not present

## 2018-12-12 DIAGNOSIS — L03116 Cellulitis of left lower limb: Secondary | ICD-10-CM | POA: Diagnosis not present

## 2018-12-12 DIAGNOSIS — M86172 Other acute osteomyelitis, left ankle and foot: Secondary | ICD-10-CM | POA: Diagnosis not present

## 2018-12-12 DIAGNOSIS — L97424 Non-pressure chronic ulcer of left heel and midfoot with necrosis of bone: Secondary | ICD-10-CM | POA: Diagnosis not present

## 2018-12-12 DIAGNOSIS — N2581 Secondary hyperparathyroidism of renal origin: Secondary | ICD-10-CM | POA: Diagnosis not present

## 2018-12-12 DIAGNOSIS — E1069 Type 1 diabetes mellitus with other specified complication: Secondary | ICD-10-CM | POA: Diagnosis not present

## 2018-12-12 DIAGNOSIS — E10621 Type 1 diabetes mellitus with foot ulcer: Secondary | ICD-10-CM | POA: Diagnosis not present

## 2018-12-12 DIAGNOSIS — E1022 Type 1 diabetes mellitus with diabetic chronic kidney disease: Secondary | ICD-10-CM | POA: Diagnosis not present

## 2018-12-12 DIAGNOSIS — D631 Anemia in chronic kidney disease: Secondary | ICD-10-CM | POA: Diagnosis not present

## 2018-12-12 DIAGNOSIS — D509 Iron deficiency anemia, unspecified: Secondary | ICD-10-CM | POA: Diagnosis not present

## 2018-12-13 DIAGNOSIS — M86172 Other acute osteomyelitis, left ankle and foot: Secondary | ICD-10-CM | POA: Diagnosis not present

## 2018-12-13 DIAGNOSIS — T8142XA Infection following a procedure, deep incisional surgical site, initial encounter: Secondary | ICD-10-CM | POA: Diagnosis not present

## 2018-12-13 DIAGNOSIS — L03116 Cellulitis of left lower limb: Secondary | ICD-10-CM | POA: Diagnosis not present

## 2018-12-13 DIAGNOSIS — E10621 Type 1 diabetes mellitus with foot ulcer: Secondary | ICD-10-CM | POA: Diagnosis not present

## 2018-12-13 DIAGNOSIS — E1069 Type 1 diabetes mellitus with other specified complication: Secondary | ICD-10-CM | POA: Diagnosis not present

## 2018-12-13 DIAGNOSIS — L97424 Non-pressure chronic ulcer of left heel and midfoot with necrosis of bone: Secondary | ICD-10-CM | POA: Diagnosis not present

## 2018-12-14 DIAGNOSIS — M86172 Other acute osteomyelitis, left ankle and foot: Secondary | ICD-10-CM | POA: Diagnosis not present

## 2018-12-14 DIAGNOSIS — D509 Iron deficiency anemia, unspecified: Secondary | ICD-10-CM | POA: Diagnosis not present

## 2018-12-14 DIAGNOSIS — L97424 Non-pressure chronic ulcer of left heel and midfoot with necrosis of bone: Secondary | ICD-10-CM | POA: Diagnosis not present

## 2018-12-14 DIAGNOSIS — E1069 Type 1 diabetes mellitus with other specified complication: Secondary | ICD-10-CM | POA: Diagnosis not present

## 2018-12-14 DIAGNOSIS — E1022 Type 1 diabetes mellitus with diabetic chronic kidney disease: Secondary | ICD-10-CM | POA: Diagnosis not present

## 2018-12-14 DIAGNOSIS — D631 Anemia in chronic kidney disease: Secondary | ICD-10-CM | POA: Diagnosis not present

## 2018-12-14 DIAGNOSIS — N2581 Secondary hyperparathyroidism of renal origin: Secondary | ICD-10-CM | POA: Diagnosis not present

## 2018-12-14 DIAGNOSIS — E10621 Type 1 diabetes mellitus with foot ulcer: Secondary | ICD-10-CM | POA: Diagnosis not present

## 2018-12-14 DIAGNOSIS — N186 End stage renal disease: Secondary | ICD-10-CM | POA: Diagnosis not present

## 2018-12-14 DIAGNOSIS — L03116 Cellulitis of left lower limb: Secondary | ICD-10-CM | POA: Diagnosis not present

## 2018-12-14 DIAGNOSIS — T8142XA Infection following a procedure, deep incisional surgical site, initial encounter: Secondary | ICD-10-CM | POA: Diagnosis not present

## 2018-12-16 DIAGNOSIS — N2581 Secondary hyperparathyroidism of renal origin: Secondary | ICD-10-CM | POA: Diagnosis not present

## 2018-12-16 DIAGNOSIS — N186 End stage renal disease: Secondary | ICD-10-CM | POA: Diagnosis not present

## 2018-12-16 DIAGNOSIS — T8142XA Infection following a procedure, deep incisional surgical site, initial encounter: Secondary | ICD-10-CM | POA: Diagnosis not present

## 2018-12-16 DIAGNOSIS — D509 Iron deficiency anemia, unspecified: Secondary | ICD-10-CM | POA: Diagnosis not present

## 2018-12-16 DIAGNOSIS — L03116 Cellulitis of left lower limb: Secondary | ICD-10-CM | POA: Diagnosis not present

## 2018-12-16 DIAGNOSIS — M86172 Other acute osteomyelitis, left ankle and foot: Secondary | ICD-10-CM | POA: Diagnosis not present

## 2018-12-16 DIAGNOSIS — E1069 Type 1 diabetes mellitus with other specified complication: Secondary | ICD-10-CM | POA: Diagnosis not present

## 2018-12-16 DIAGNOSIS — E10621 Type 1 diabetes mellitus with foot ulcer: Secondary | ICD-10-CM | POA: Diagnosis not present

## 2018-12-16 DIAGNOSIS — L97424 Non-pressure chronic ulcer of left heel and midfoot with necrosis of bone: Secondary | ICD-10-CM | POA: Diagnosis not present

## 2018-12-16 DIAGNOSIS — D631 Anemia in chronic kidney disease: Secondary | ICD-10-CM | POA: Diagnosis not present

## 2018-12-16 DIAGNOSIS — E1022 Type 1 diabetes mellitus with diabetic chronic kidney disease: Secondary | ICD-10-CM | POA: Diagnosis not present

## 2018-12-19 DIAGNOSIS — L03116 Cellulitis of left lower limb: Secondary | ICD-10-CM | POA: Diagnosis not present

## 2018-12-19 DIAGNOSIS — N2581 Secondary hyperparathyroidism of renal origin: Secondary | ICD-10-CM | POA: Diagnosis not present

## 2018-12-19 DIAGNOSIS — E1022 Type 1 diabetes mellitus with diabetic chronic kidney disease: Secondary | ICD-10-CM | POA: Diagnosis not present

## 2018-12-19 DIAGNOSIS — T8142XA Infection following a procedure, deep incisional surgical site, initial encounter: Secondary | ICD-10-CM | POA: Diagnosis not present

## 2018-12-19 DIAGNOSIS — E10621 Type 1 diabetes mellitus with foot ulcer: Secondary | ICD-10-CM | POA: Diagnosis not present

## 2018-12-19 DIAGNOSIS — D509 Iron deficiency anemia, unspecified: Secondary | ICD-10-CM | POA: Diagnosis not present

## 2018-12-19 DIAGNOSIS — L97424 Non-pressure chronic ulcer of left heel and midfoot with necrosis of bone: Secondary | ICD-10-CM | POA: Diagnosis not present

## 2018-12-19 DIAGNOSIS — M86172 Other acute osteomyelitis, left ankle and foot: Secondary | ICD-10-CM | POA: Diagnosis not present

## 2018-12-19 DIAGNOSIS — N186 End stage renal disease: Secondary | ICD-10-CM | POA: Diagnosis not present

## 2018-12-19 DIAGNOSIS — D631 Anemia in chronic kidney disease: Secondary | ICD-10-CM | POA: Diagnosis not present

## 2018-12-19 DIAGNOSIS — E1069 Type 1 diabetes mellitus with other specified complication: Secondary | ICD-10-CM | POA: Diagnosis not present

## 2018-12-21 DIAGNOSIS — M86172 Other acute osteomyelitis, left ankle and foot: Secondary | ICD-10-CM | POA: Diagnosis not present

## 2018-12-21 DIAGNOSIS — T8142XA Infection following a procedure, deep incisional surgical site, initial encounter: Secondary | ICD-10-CM | POA: Diagnosis not present

## 2018-12-21 DIAGNOSIS — E10621 Type 1 diabetes mellitus with foot ulcer: Secondary | ICD-10-CM | POA: Diagnosis not present

## 2018-12-21 DIAGNOSIS — D509 Iron deficiency anemia, unspecified: Secondary | ICD-10-CM | POA: Diagnosis not present

## 2018-12-21 DIAGNOSIS — E1069 Type 1 diabetes mellitus with other specified complication: Secondary | ICD-10-CM | POA: Diagnosis not present

## 2018-12-21 DIAGNOSIS — L03116 Cellulitis of left lower limb: Secondary | ICD-10-CM | POA: Diagnosis not present

## 2018-12-21 DIAGNOSIS — L97424 Non-pressure chronic ulcer of left heel and midfoot with necrosis of bone: Secondary | ICD-10-CM | POA: Diagnosis not present

## 2018-12-21 DIAGNOSIS — N186 End stage renal disease: Secondary | ICD-10-CM | POA: Diagnosis not present

## 2018-12-21 DIAGNOSIS — E1022 Type 1 diabetes mellitus with diabetic chronic kidney disease: Secondary | ICD-10-CM | POA: Diagnosis not present

## 2018-12-21 DIAGNOSIS — N2581 Secondary hyperparathyroidism of renal origin: Secondary | ICD-10-CM | POA: Diagnosis not present

## 2018-12-21 DIAGNOSIS — D631 Anemia in chronic kidney disease: Secondary | ICD-10-CM | POA: Diagnosis not present

## 2018-12-22 ENCOUNTER — Ambulatory Visit (INDEPENDENT_AMBULATORY_CARE_PROVIDER_SITE_OTHER): Payer: Medicare Other | Admitting: Podiatry

## 2018-12-22 ENCOUNTER — Telehealth: Payer: Self-pay | Admitting: *Deleted

## 2018-12-22 ENCOUNTER — Other Ambulatory Visit: Payer: Self-pay

## 2018-12-22 DIAGNOSIS — E10621 Type 1 diabetes mellitus with foot ulcer: Secondary | ICD-10-CM | POA: Diagnosis not present

## 2018-12-22 DIAGNOSIS — E08621 Diabetes mellitus due to underlying condition with foot ulcer: Secondary | ICD-10-CM

## 2018-12-22 DIAGNOSIS — L97423 Non-pressure chronic ulcer of left heel and midfoot with necrosis of muscle: Principal | ICD-10-CM

## 2018-12-22 DIAGNOSIS — L97521 Non-pressure chronic ulcer of other part of left foot limited to breakdown of skin: Secondary | ICD-10-CM

## 2018-12-22 NOTE — Telephone Encounter (Signed)
Faxed orders for Santyl to [facility].

## 2018-12-22 NOTE — Progress Notes (Signed)
Subjective:  Patient ID: [name], male    DOB: Jun 05, 1981,  MRN: [id number]  Chief Complaint  Patient presents with  . Foot Problem    pov amputation, Pt only seen by Dr. March Rummage   38 y.o. male presents for wound care follow-up.  Denies new complaints today.   New wounds noted today to toes by DPM, patient unclear how they happened.  Review of Systems: Negative except as noted in the HPI. Denies N/V/F/Ch.  Past Medical History:  Diagnosis Date  . Diabetes mellitus without complication ([place])   . Diabetic gastroparesis ([place])   . Dialysis patient ([place])   . Hypertension   . Renal disorder    Dialysis  . Sepsis ([place])     Current Outpatient Medications:  .  calcitRIOL (ROCALTROL) 0.5 MCG capsule, Take 5 capsules (2.5 mcg total) by mouth every Monday, Wednesday, and Friday with hemodialysis., Disp: 30 capsule, Rfl: 0 .  ceFAZolin (ANCEF) 2-4 GM/100ML-% IVPB, Inject 100 mLs (2 g total) into the vein every Monday, Wednesday, and Friday with hemodialysis., Disp: 1 each, Rfl: 7 .  cinacalcet (SENSIPAR) 30 MG tablet, Take 30 mg by mouth daily with supper., Disp: , Rfl:  .  clonazePAM (KLONOPIN) 0.5 MG tablet, Take 0.5 mg by mouth daily as needed for anxiety (sleep). , Disp: , Rfl:  .  collagenase (SANTYL) ointment, Apply 1 application topically daily., Disp: 15 g, Rfl: 0 .  collagenase (SANTYL) ointment, Apply 1 application topically daily. Left 1st metatarsal amputation site measuring 4.0 x 4.0 x 0.5cm, Disp: 15 g, Rfl: 5 .  glucose blood (KROGER TEST STRIPS) test strip, by Other route 4 (four) times a day as needed., Disp: , Rfl:  .  HYDROcodone-acetaminophen (NORCO/VICODIN) 5-325 MG tablet, Take 1-2 tablets by mouth every 6 (six) hours as needed for moderate pain or severe pain., Disp: 20 tablet, Rfl: 0 .  insulin aspart (NOVOLOG) 100 UNIT/ML injection, Inject 6-7 Units into the skin 2 (two) times daily with a meal. , Disp: , Rfl:  .  insulin glargine (LANTUS) 100 UNIT/ML injection,  Inject 0.15 mLs (15 Units total) into the skin at bedtime., Disp: 10 mL, Rfl: 1 .  multivitamin (RENA-VIT) TABS tablet, Take 1 tablet by mouth at bedtime., Disp: 30 tablet, Rfl: 0 .  ondansetron (ZOFRAN ODT) 4 MG disintegrating tablet, Take 1 tablet (4 mg total) by mouth every 8 (eight) hours as needed for nausea or vomiting., Disp: 10 tablet, Rfl: 0 .  sevelamer carbonate (RENVELA) 800 MG tablet, Take 2,400-3,200 mg by mouth See admin instructions. Take 3 - 4 tablets (2400 mg -3200 mg) by mouth two times daily with meals, Disp: , Rfl:   Social History   Tobacco Use  Smoking Status Never Smoker  Smokeless Tobacco Never Used    No Known Allergies Objective:  There were no vitals filed for this visit. There is no height or weight on file to calculate BMI. Constitutional Well developed.  Vascular Left foot warm to touch  Neurologic Oriented to person, place, and time. Epicritic sensation to light touch grossly absent bilaterally.  Dermatologic 3x4 wound Left 1st metatarsal with fibrogranular base.  No warmth erythema signs of acute infection.  No exposed bone New ulcerations to the second and third toe areas left foot    Orthopedic: Left hallux and fifth toe amputations noted.   Radiographs: None Assessment:   1. 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  S/p L I&D of Abscess, Hallux Amputation, with debridement and wound subsequent partial closure -Continue santyl. Will refill. -Debridement of wound with tissue nipper and 312 blade -Dressed with prisma today. -Wound has not meaningfully progressed. Eventually would consider operative debridement on non-urgent basis. As hospital is currently only doing essential cases we can safely hold off as no signs of infection noted today. -Will get XR next visit   Left 2nd/3rd Toe wound  -Pt unclear how it happened -Debrided with tissue nipper -Discussed importance of monitoring for new wounds and avoiding new ulcerations.  HHC to apply santyl to all open areas.    No follow-ups on file.

## 2018-12-22 NOTE — Telephone Encounter (Signed)
-----   Message from Evelina Bucy, DPM sent at 12/22/2018  9:02 AM EDT ----- Can we refill Santyl and send new [place] [facility] orders?

## 2018-12-22 NOTE — Progress Notes (Signed)
Xray next visit

## 2018-12-23 DIAGNOSIS — M86172 Other acute osteomyelitis, left ankle and foot: Secondary | ICD-10-CM | POA: Diagnosis not present

## 2018-12-23 DIAGNOSIS — D509 Iron deficiency anemia, unspecified: Secondary | ICD-10-CM | POA: Diagnosis not present

## 2018-12-23 DIAGNOSIS — E1069 Type 1 diabetes mellitus with other specified complication: Secondary | ICD-10-CM | POA: Diagnosis not present

## 2018-12-23 DIAGNOSIS — D631 Anemia in chronic kidney disease: Secondary | ICD-10-CM | POA: Diagnosis not present

## 2018-12-23 DIAGNOSIS — N2581 Secondary hyperparathyroidism of renal origin: Secondary | ICD-10-CM | POA: Diagnosis not present

## 2018-12-23 DIAGNOSIS — T8142XA Infection following a procedure, deep incisional surgical site, initial encounter: Secondary | ICD-10-CM | POA: Diagnosis not present

## 2018-12-23 DIAGNOSIS — E1022 Type 1 diabetes mellitus with diabetic chronic kidney disease: Secondary | ICD-10-CM | POA: Diagnosis not present

## 2018-12-23 DIAGNOSIS — L03116 Cellulitis of left lower limb: Secondary | ICD-10-CM | POA: Diagnosis not present

## 2018-12-23 DIAGNOSIS — L97424 Non-pressure chronic ulcer of left heel and midfoot with necrosis of bone: Secondary | ICD-10-CM | POA: Diagnosis not present

## 2018-12-23 DIAGNOSIS — N186 End stage renal disease: Secondary | ICD-10-CM | POA: Diagnosis not present

## 2018-12-23 DIAGNOSIS — E10621 Type 1 diabetes mellitus with foot ulcer: Secondary | ICD-10-CM | POA: Diagnosis not present

## 2018-12-26 DIAGNOSIS — I871 Compression of vein: Secondary | ICD-10-CM | POA: Diagnosis not present

## 2018-12-26 DIAGNOSIS — T82868A Thrombosis of vascular prosthetic devices, implants and grafts, initial encounter: Secondary | ICD-10-CM | POA: Diagnosis not present

## 2018-12-26 DIAGNOSIS — T8142XA Infection following a procedure, deep incisional surgical site, initial encounter: Secondary | ICD-10-CM | POA: Diagnosis not present

## 2018-12-26 DIAGNOSIS — E10621 Type 1 diabetes mellitus with foot ulcer: Secondary | ICD-10-CM | POA: Diagnosis not present

## 2018-12-26 DIAGNOSIS — L03116 Cellulitis of left lower limb: Secondary | ICD-10-CM | POA: Diagnosis not present

## 2018-12-26 DIAGNOSIS — M86172 Other acute osteomyelitis, left ankle and foot: Secondary | ICD-10-CM | POA: Diagnosis not present

## 2018-12-26 DIAGNOSIS — E1069 Type 1 diabetes mellitus with other specified complication: Secondary | ICD-10-CM | POA: Diagnosis not present

## 2018-12-26 DIAGNOSIS — Z992 Dependence on renal dialysis: Secondary | ICD-10-CM | POA: Diagnosis not present

## 2018-12-26 DIAGNOSIS — N186 End stage renal disease: Secondary | ICD-10-CM | POA: Diagnosis not present

## 2018-12-26 DIAGNOSIS — L97424 Non-pressure chronic ulcer of left heel and midfoot with necrosis of bone: Secondary | ICD-10-CM | POA: Diagnosis not present

## 2018-12-27 NOTE — Progress Notes (Signed)
Subjective:  Patient ID: [name], male    DOB: 1981/01/13,  MRN: [id number]  Chief Complaint  Patient presents with  . Routine Post Op    POV Amputation Pt.s tates," pain is okay; 5/10) Pt. denies any issues/concerns Tx: santly, and sx shoe -pt deneis N?V/F/Ch    38 y.o. male presents for wound care follow-up.  Doing okay denies concerns with his foot.  Review of Systems: Negative except as noted in the HPI. Denies N/V/F/Ch.  Past Medical History:  Diagnosis Date  . Diabetes mellitus without complication ([place])   . Diabetic gastroparesis ([place])   . Dialysis patient ([place])   . Hypertension   . Renal disorder    Dialysis  . Sepsis ([place])     Current Outpatient Medications:  .  calcitRIOL (ROCALTROL) 0.5 MCG capsule, Take 5 capsules (2.5 mcg total) by mouth every Monday, Wednesday, and Friday with hemodialysis., Disp: 30 capsule, Rfl: 0 .  ceFAZolin (ANCEF) 2-4 GM/100ML-% IVPB, Inject 100 mLs (2 g total) into the vein every Monday, Wednesday, and Friday with hemodialysis., Disp: 1 each, Rfl: 7 .  cinacalcet (SENSIPAR) 30 MG tablet, Take 30 mg by mouth daily with supper., Disp: , Rfl:  .  clonazePAM (KLONOPIN) 0.5 MG tablet, Take 0.5 mg by mouth daily as needed for anxiety (sleep). , Disp: , Rfl:  .  collagenase (SANTYL) ointment, Apply 1 application topically daily., Disp: 15 g, Rfl: 0 .  collagenase (SANTYL) ointment, Apply 1 application topically daily. Left 1st metatarsal amputation site measuring 4.0 x 4.0 x 0.5cm, Disp: 15 g, Rfl: 5 .  glucose blood (KROGER TEST STRIPS) test strip, by Other route 4 (four) times a day as needed., Disp: , Rfl:  .  HYDROcodone-acetaminophen (NORCO/VICODIN) 5-325 MG tablet, Take 1-2 tablets by mouth every 6 (six) hours as needed for moderate pain or severe pain., Disp: 20 tablet, Rfl: 0 .  insulin aspart (NOVOLOG) 100 UNIT/ML injection, Inject 6-7 Units into the skin 2 (two) times daily with a meal. , Disp: , Rfl:  .  insulin glargine (LANTUS) 100  UNIT/ML injection, Inject 0.15 mLs (15 Units total) into the skin at bedtime., Disp: 10 mL, Rfl: 1 .  multivitamin (RENA-VIT) TABS tablet, Take 1 tablet by mouth at bedtime., Disp: 30 tablet, Rfl: 0 .  ondansetron (ZOFRAN ODT) 4 MG disintegrating tablet, Take 1 tablet (4 mg total) by mouth every 8 (eight) hours as needed for nausea or vomiting., Disp: 10 tablet, Rfl: 0 .  sevelamer carbonate (RENVELA) 800 MG tablet, Take 2,400-3,200 mg by mouth See admin instructions. Take 3 - 4 tablets (2400 mg -3200 mg) by mouth two times daily with meals, Disp: , Rfl:   Social History   Tobacco Use  Smoking Status Never Smoker  Smokeless Tobacco Never Used    No Known Allergies Objective:   Vitals:   12/08/18 1434  BP: (!) 206/183  Pulse: 80  Resp: 16  Temp: 98 F (36.7 C)   There is no height or weight on file to calculate BMI. Constitutional Well developed.  Vascular Left foot warm to touch  Neurologic Oriented to person, place, and time. Epicritic sensation to light touch grossly absent bilaterally.  Dermatologic  3 x 3.5 wound distal left first metatarsal area with fibro-granular base.  Orthopedic: Left hallux and fifth toe amputations noted.   Radiographs: None Assessment:   1. Post-operative state   2. Diabetic ulcer of left midfoot associated with diabetes mellitus due to underlying condition, with necrosis of  muscle ([place])   3. 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 Ulcer of left foot, with fat layer exposed ([place])   5. PAD (peripheral artery disease) ([name])   6. Cellulitis of left foot    Plan:  Patient was evaluated and treated and all questions answered.  S/p L I&D of Abscess, Hallux Amputation, with debridement and wound subsequent partial closure -Continue santyl. -No debridement today -Dressed with Medihoney and DSD   No follow-ups on file.

## 2018-12-28 DIAGNOSIS — L97424 Non-pressure chronic ulcer of left heel and midfoot with necrosis of bone: Secondary | ICD-10-CM | POA: Diagnosis not present

## 2018-12-28 DIAGNOSIS — L03116 Cellulitis of left lower limb: Secondary | ICD-10-CM | POA: Diagnosis not present

## 2018-12-28 DIAGNOSIS — N186 End stage renal disease: Secondary | ICD-10-CM | POA: Diagnosis not present

## 2018-12-28 DIAGNOSIS — I129 Hypertensive chronic kidney disease with stage 1 through stage 4 chronic kidney disease, or unspecified chronic kidney disease: Secondary | ICD-10-CM | POA: Diagnosis not present

## 2018-12-28 DIAGNOSIS — T8142XA Infection following a procedure, deep incisional surgical site, initial encounter: Secondary | ICD-10-CM | POA: Diagnosis not present

## 2018-12-28 DIAGNOSIS — M86172 Other acute osteomyelitis, left ankle and foot: Secondary | ICD-10-CM | POA: Diagnosis not present

## 2018-12-28 DIAGNOSIS — Z992 Dependence on renal dialysis: Secondary | ICD-10-CM | POA: Diagnosis not present

## 2018-12-28 DIAGNOSIS — E1069 Type 1 diabetes mellitus with other specified complication: Secondary | ICD-10-CM | POA: Diagnosis not present

## 2018-12-28 DIAGNOSIS — N2581 Secondary hyperparathyroidism of renal origin: Secondary | ICD-10-CM | POA: Diagnosis not present

## 2018-12-28 DIAGNOSIS — E1022 Type 1 diabetes mellitus with diabetic chronic kidney disease: Secondary | ICD-10-CM | POA: Diagnosis not present

## 2018-12-28 DIAGNOSIS — R739 Hyperglycemia, unspecified: Secondary | ICD-10-CM | POA: Diagnosis not present

## 2018-12-28 DIAGNOSIS — D509 Iron deficiency anemia, unspecified: Secondary | ICD-10-CM | POA: Diagnosis not present

## 2018-12-28 DIAGNOSIS — E10621 Type 1 diabetes mellitus with foot ulcer: Secondary | ICD-10-CM | POA: Diagnosis not present

## 2018-12-29 ENCOUNTER — Other Ambulatory Visit: Payer: Self-pay

## 2018-12-29 ENCOUNTER — Encounter: Payer: Self-pay | Admitting: Podiatry

## 2018-12-29 ENCOUNTER — Ambulatory Visit (INDEPENDENT_AMBULATORY_CARE_PROVIDER_SITE_OTHER): Payer: Medicare Other

## 2018-12-29 ENCOUNTER — Ambulatory Visit (INDEPENDENT_AMBULATORY_CARE_PROVIDER_SITE_OTHER): Payer: Medicare Other | Admitting: Podiatry

## 2018-12-29 VITALS — Temp 96.6°F

## 2018-12-29 DIAGNOSIS — L97423 Non-pressure chronic ulcer of left heel and midfoot with necrosis of muscle: Secondary | ICD-10-CM

## 2018-12-29 DIAGNOSIS — L97521 Non-pressure chronic ulcer of other part of left foot limited to breakdown of skin: Secondary | ICD-10-CM

## 2018-12-29 DIAGNOSIS — E10621 Type 1 diabetes mellitus with foot ulcer: Secondary | ICD-10-CM

## 2018-12-29 DIAGNOSIS — E08621 Diabetes mellitus due to underlying condition with foot ulcer: Secondary | ICD-10-CM

## 2018-12-29 NOTE — Progress Notes (Signed)
Subjective:  Patient ID: [name], male    DOB: 10-27-1980,  MRN: [id number]  Chief Complaint  Patient presents with  . Routine Post Op    POV Amputation left   "Its doing okay. Home health still changing it"   38 y.o. male presents for wound care follow-up.  Denies new complaints today.   Review of Systems: Negative except as noted in the HPI. Denies N/V/F/Ch.  Past Medical History:  Diagnosis Date  . Diabetes mellitus without complication ([place])   . Diabetic gastroparesis ([place])   . Dialysis patient ([place])   . Hypertension   . Renal disorder    Dialysis  . Sepsis ([place])     Current Outpatient Medications:  .  calcitRIOL (ROCALTROL) 0.5 MCG capsule, Take 5 capsules (2.5 mcg total) by mouth every Monday, Wednesday, and Friday with hemodialysis., Disp: 30 capsule, Rfl: 0 .  ceFAZolin (ANCEF) 2-4 GM/100ML-% IVPB, Inject 100 mLs (2 g total) into the vein every Monday, Wednesday, and Friday with hemodialysis., Disp: 1 each, Rfl: 7 .  cinacalcet (SENSIPAR) 30 MG tablet, Take 30 mg by mouth daily with supper., Disp: , Rfl:  .  clonazePAM (KLONOPIN) 0.5 MG tablet, Take 0.5 mg by mouth daily as needed for anxiety (sleep). , Disp: , Rfl:  .  collagenase (SANTYL) ointment, Apply 1 application topically daily., Disp: 15 g, Rfl: 0 .  collagenase (SANTYL) ointment, Apply 1 application topically daily. Left 1st metatarsal amputation site measuring 4.0 x 4.0 x 0.5cm, Disp: 15 g, Rfl: 5 .  glucose blood (KROGER TEST STRIPS) test strip, by Other route 4 (four) times a day as needed., Disp: , Rfl:  .  HYDROcodone-acetaminophen (NORCO/VICODIN) 5-325 MG tablet, Take 1-2 tablets by mouth every 6 (six) hours as needed for moderate pain or severe pain., Disp: 20 tablet, Rfl: 0 .  insulin aspart (NOVOLOG) 100 UNIT/ML injection, Inject 6-7 Units into the skin 2 (two) times daily with a meal. , Disp: , Rfl:  .  insulin glargine (LANTUS) 100 UNIT/ML injection, Inject 0.15 mLs (15 Units total) into the skin  at bedtime., Disp: 10 mL, Rfl: 1 .  multivitamin (RENA-VIT) TABS tablet, Take 1 tablet by mouth at bedtime., Disp: 30 tablet, Rfl: 0 .  ondansetron (ZOFRAN ODT) 4 MG disintegrating tablet, Take 1 tablet (4 mg total) by mouth every 8 (eight) hours as needed for nausea or vomiting., Disp: 10 tablet, Rfl: 0 .  sevelamer carbonate (RENVELA) 800 MG tablet, Take 2,400-3,200 mg by mouth See admin instructions. Take 3 - 4 tablets (2400 mg -3200 mg) by mouth two times daily with meals, Disp: , Rfl:   Social History   Tobacco Use  Smoking Status Never Smoker  Smokeless Tobacco Never Used    No Known Allergies Objective:   Vitals:   12/29/18 0852  Temp: (!) 96.6 F (35.9 C)   There is no height or weight on file to calculate BMI. Constitutional Well developed.  Vascular Left foot warm to touch  Neurologic Oriented to person, place, and time. Epicritic sensation to light touch grossly absent bilaterally.  Dermatologic 3x3 wound Left 1st metatarsal with fibrogranular base.  No warmth erythema signs of acute infection.  No exposed bone 2nd toe ulcer healed 3rd toe ulcer healing   Orthopedic: Left hallux and fifth toe amputations noted.   Radiographs: None Assessment:   1. 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 Diabetic ulcer of  toe of left foot associated with type 1 diabetes mellitus, limited to breakdown of skin ([place])    Plan:  Patient was evaluated and treated and all questions answered.  S/p L I&D of Abscess, Hallux Amputation, with debridement and wound subsequent partial closure -Wound improving. Hold off surgical debridement. -Debrided and santyl WTD applied -XR reviewed, possible worsening lysis of the 1st metatarsal but without acute findings.  Left 2nd/3rd Toe wound -XR taken underlying fracture 2nd proximal phalanx Would benefit from conservative care. -Continue WBAT in sx shoe.    Return in about 2 weeks (around 01/12/2019).

## 2018-12-30 ENCOUNTER — Telehealth: Payer: Self-pay | Admitting: *Deleted

## 2018-12-30 DIAGNOSIS — E10621 Type 1 diabetes mellitus with foot ulcer: Secondary | ICD-10-CM | POA: Diagnosis not present

## 2018-12-30 DIAGNOSIS — L97424 Non-pressure chronic ulcer of left heel and midfoot with necrosis of bone: Secondary | ICD-10-CM | POA: Diagnosis not present

## 2018-12-30 DIAGNOSIS — R739 Hyperglycemia, unspecified: Secondary | ICD-10-CM | POA: Diagnosis not present

## 2018-12-30 DIAGNOSIS — E1022 Type 1 diabetes mellitus with diabetic chronic kidney disease: Secondary | ICD-10-CM | POA: Diagnosis not present

## 2018-12-30 DIAGNOSIS — N2581 Secondary hyperparathyroidism of renal origin: Secondary | ICD-10-CM | POA: Diagnosis not present

## 2018-12-30 DIAGNOSIS — E1069 Type 1 diabetes mellitus with other specified complication: Secondary | ICD-10-CM | POA: Diagnosis not present

## 2018-12-30 DIAGNOSIS — L03116 Cellulitis of left lower limb: Secondary | ICD-10-CM | POA: Diagnosis not present

## 2018-12-30 DIAGNOSIS — M86172 Other acute osteomyelitis, left ankle and foot: Secondary | ICD-10-CM | POA: Diagnosis not present

## 2018-12-30 DIAGNOSIS — N186 End stage renal disease: Secondary | ICD-10-CM | POA: Diagnosis not present

## 2018-12-30 DIAGNOSIS — T8142XA Infection following a procedure, deep incisional surgical site, initial encounter: Secondary | ICD-10-CM | POA: Diagnosis not present

## 2018-12-30 DIAGNOSIS — D509 Iron deficiency anemia, unspecified: Secondary | ICD-10-CM | POA: Diagnosis not present

## 2018-12-30 NOTE — Telephone Encounter (Signed)
I informed [name] - [facility] Dr. March Rummage stated Santyl 3 times a week, and to continue the [facility] at least 60 days.

## 2018-12-30 NOTE — Telephone Encounter (Signed)
Santyl plese

## 2018-12-30 NOTE — Telephone Encounter (Addendum)
[name] request verbal orders to continue care and wanted to know if they were to continue the santy 3 times a week, or medihoney with foam 2 times a week or the wound vac, pt was coming up on the 60 day time limit on his insurance for his [name].

## 2019-01-02 DIAGNOSIS — E10621 Type 1 diabetes mellitus with foot ulcer: Secondary | ICD-10-CM | POA: Diagnosis not present

## 2019-01-02 DIAGNOSIS — M86172 Other acute osteomyelitis, left ankle and foot: Secondary | ICD-10-CM | POA: Diagnosis not present

## 2019-01-02 DIAGNOSIS — L97424 Non-pressure chronic ulcer of left heel and midfoot with necrosis of bone: Secondary | ICD-10-CM | POA: Diagnosis not present

## 2019-01-02 DIAGNOSIS — N2581 Secondary hyperparathyroidism of renal origin: Secondary | ICD-10-CM | POA: Diagnosis not present

## 2019-01-02 DIAGNOSIS — E1069 Type 1 diabetes mellitus with other specified complication: Secondary | ICD-10-CM | POA: Diagnosis not present

## 2019-01-02 DIAGNOSIS — R739 Hyperglycemia, unspecified: Secondary | ICD-10-CM | POA: Diagnosis not present

## 2019-01-02 DIAGNOSIS — E1022 Type 1 diabetes mellitus with diabetic chronic kidney disease: Secondary | ICD-10-CM | POA: Diagnosis not present

## 2019-01-02 DIAGNOSIS — D509 Iron deficiency anemia, unspecified: Secondary | ICD-10-CM | POA: Diagnosis not present

## 2019-01-02 DIAGNOSIS — L03116 Cellulitis of left lower limb: Secondary | ICD-10-CM | POA: Diagnosis not present

## 2019-01-02 DIAGNOSIS — T8142XA Infection following a procedure, deep incisional surgical site, initial encounter: Secondary | ICD-10-CM | POA: Diagnosis not present

## 2019-01-02 DIAGNOSIS — N186 End stage renal disease: Secondary | ICD-10-CM | POA: Diagnosis not present

## 2019-01-04 ENCOUNTER — Telehealth: Payer: Self-pay | Admitting: Podiatry

## 2019-01-04 DIAGNOSIS — E1022 Type 1 diabetes mellitus with diabetic chronic kidney disease: Secondary | ICD-10-CM | POA: Diagnosis not present

## 2019-01-04 DIAGNOSIS — D509 Iron deficiency anemia, unspecified: Secondary | ICD-10-CM | POA: Diagnosis not present

## 2019-01-04 DIAGNOSIS — L97424 Non-pressure chronic ulcer of left heel and midfoot with necrosis of bone: Secondary | ICD-10-CM | POA: Diagnosis not present

## 2019-01-04 DIAGNOSIS — M86172 Other acute osteomyelitis, left ankle and foot: Secondary | ICD-10-CM | POA: Diagnosis not present

## 2019-01-04 DIAGNOSIS — E1129 Type 2 diabetes mellitus with other diabetic kidney complication: Secondary | ICD-10-CM | POA: Diagnosis not present

## 2019-01-04 DIAGNOSIS — N186 End stage renal disease: Secondary | ICD-10-CM | POA: Diagnosis not present

## 2019-01-04 DIAGNOSIS — L03116 Cellulitis of left lower limb: Secondary | ICD-10-CM | POA: Diagnosis not present

## 2019-01-04 DIAGNOSIS — E10621 Type 1 diabetes mellitus with foot ulcer: Secondary | ICD-10-CM | POA: Diagnosis not present

## 2019-01-04 DIAGNOSIS — R739 Hyperglycemia, unspecified: Secondary | ICD-10-CM | POA: Diagnosis not present

## 2019-01-04 DIAGNOSIS — E1069 Type 1 diabetes mellitus with other specified complication: Secondary | ICD-10-CM | POA: Diagnosis not present

## 2019-01-04 DIAGNOSIS — T8142XA Infection following a procedure, deep incisional surgical site, initial encounter: Secondary | ICD-10-CM | POA: Diagnosis not present

## 2019-01-04 DIAGNOSIS — N2581 Secondary hyperparathyroidism of renal origin: Secondary | ICD-10-CM | POA: Diagnosis not present

## 2019-01-04 MED ORDER — COLLAGENASE 250 UNIT/GM EX OINT: 1.0000 "application " | TOPICAL_OINTMENT | Freq: Every day | CUTANEOUS | 5 refills | Status: DC

## 2019-01-04 NOTE — Telephone Encounter (Signed)
Pts home health nurse called stating that patient needs refill on his Santyl ointment sent in to the [facility] @ [facility]. Nurse was also calling to make the doctor aware of a new blister that has come up on pts left foot near instep, the area is not red or warm and she has wrapped the area to protect it.   Patient was called and left a voicemail offering an appt for tomorrow 01/05/19 and pt has not returned the call to confirm the appt.

## 2019-01-04 NOTE — Addendum Note (Signed)
Addended by: Harriett Sine D on: 01/04/2019 04:28 PM   Modules accepted: Orders

## 2019-01-05 ENCOUNTER — Encounter: Payer: Medicare Other | Admitting: Podiatry

## 2019-01-06 ENCOUNTER — Other Ambulatory Visit (HOSPITAL_COMMUNITY): Payer: Self-pay | Admitting: Nephrology

## 2019-01-06 ENCOUNTER — Encounter (HOSPITAL_COMMUNITY): Payer: Self-pay | Admitting: Interventional Radiology

## 2019-01-06 ENCOUNTER — Other Ambulatory Visit: Payer: Self-pay

## 2019-01-06 ENCOUNTER — Ambulatory Visit (HOSPITAL_COMMUNITY)
Admission: RE | Admit: 2019-01-06 | Discharge: 2019-01-06 | Disposition: A | Discharge status: Home / Self Care | Payer: Medicare Other | Source: Ambulatory Visit | Attending: Nephrology | Admitting: Nephrology

## 2019-01-06 DIAGNOSIS — K3184 Gastroparesis: Secondary | ICD-10-CM | POA: Diagnosis not present

## 2019-01-06 DIAGNOSIS — L97424 Non-pressure chronic ulcer of left heel and midfoot with necrosis of bone: Secondary | ICD-10-CM | POA: Diagnosis not present

## 2019-01-06 DIAGNOSIS — E1022 Type 1 diabetes mellitus with diabetic chronic kidney disease: Secondary | ICD-10-CM | POA: Diagnosis not present

## 2019-01-06 DIAGNOSIS — Z79899 Other long term (current) drug therapy: Secondary | ICD-10-CM | POA: Diagnosis not present

## 2019-01-06 DIAGNOSIS — N186 End stage renal disease: Secondary | ICD-10-CM

## 2019-01-06 DIAGNOSIS — E1143 Type 2 diabetes mellitus with diabetic autonomic (poly)neuropathy: Secondary | ICD-10-CM | POA: Insufficient documentation

## 2019-01-06 DIAGNOSIS — Y832 Surgical operation with anastomosis, bypass or graft as the cause of abnormal reaction of the patient, or of later complication, without mention of misadventure at the time of the procedure: Secondary | ICD-10-CM | POA: Diagnosis not present

## 2019-01-06 DIAGNOSIS — E1065 Type 1 diabetes mellitus with hyperglycemia: Secondary | ICD-10-CM | POA: Diagnosis not present

## 2019-01-06 DIAGNOSIS — I120 Hypertensive chronic kidney disease with stage 5 chronic kidney disease or end stage renal disease: Secondary | ICD-10-CM | POA: Diagnosis not present

## 2019-01-06 DIAGNOSIS — Z992 Dependence on renal dialysis: Principal | ICD-10-CM

## 2019-01-06 DIAGNOSIS — D631 Anemia in chronic kidney disease: Secondary | ICD-10-CM | POA: Diagnosis not present

## 2019-01-06 DIAGNOSIS — E1122 Type 2 diabetes mellitus with diabetic chronic kidney disease: Secondary | ICD-10-CM | POA: Insufficient documentation

## 2019-01-06 DIAGNOSIS — E1042 Type 1 diabetes mellitus with diabetic polyneuropathy: Secondary | ICD-10-CM | POA: Diagnosis not present

## 2019-01-06 DIAGNOSIS — E10621 Type 1 diabetes mellitus with foot ulcer: Secondary | ICD-10-CM | POA: Diagnosis not present

## 2019-01-06 DIAGNOSIS — T82868A Thrombosis of vascular prosthetic devices, implants and grafts, initial encounter: Secondary | ICD-10-CM | POA: Insufficient documentation

## 2019-01-06 DIAGNOSIS — T82858A Stenosis of vascular prosthetic devices, implants and grafts, initial encounter: Secondary | ICD-10-CM | POA: Diagnosis not present

## 2019-01-06 DIAGNOSIS — T8142XA Infection following a procedure, deep incisional surgical site, initial encounter: Secondary | ICD-10-CM | POA: Diagnosis not present

## 2019-01-06 DIAGNOSIS — Z794 Long term (current) use of insulin: Secondary | ICD-10-CM | POA: Insufficient documentation

## 2019-01-06 DIAGNOSIS — Z4800 Encounter for change or removal of nonsurgical wound dressing: Secondary | ICD-10-CM | POA: Diagnosis not present

## 2019-01-06 DIAGNOSIS — Z8631 Personal history of diabetic foot ulcer: Secondary | ICD-10-CM | POA: Diagnosis not present

## 2019-01-06 HISTORY — PX: IR THROMBECTOMY AV FISTULA W/THROMBOLYSIS/PTA INC/SHUNT/IMG LEFT: IMG6106

## 2019-01-06 LAB — GLUCOSE, CAPILLARY
Glucose-Capillary: 180 mg/dL — ABNORMAL HIGH (ref 70–99)
Glucose-Capillary: 42 mg/dL — CL (ref 70–99)
Glucose-Capillary: 57 mg/dL — ABNORMAL LOW (ref 70–99)
Glucose-Capillary: 182 mg/dL — ABNORMAL HIGH (ref 70–99)

## 2019-01-06 MED ORDER — FENTANYL CITRATE (PF) 100 MCG/2ML IJ SOLN
INTRAMUSCULAR | Status: AC | PRN
  Administered 2019-01-06: 25 ug via INTRAVENOUS

## 2019-01-06 MED ORDER — GLUCOSE 4 G PO CHEW
CHEWABLE_TABLET | ORAL | Status: AC
  Filled 2019-01-06: qty 2

## 2019-01-06 MED ORDER — DEXTROSE 50 % IV SOLN
INTRAVENOUS | Status: AC
  Filled 2019-01-06: qty 50

## 2019-01-06 MED ORDER — DEXTROSE 50 % IV SOLN: 1.0000 | Freq: Once | INTRAVENOUS | Status: DC

## 2019-01-06 MED ORDER — MIDAZOLAM HCL 2 MG/2ML IJ SOLN
INTRAMUSCULAR | Status: AC | PRN
  Administered 2019-01-06: 1 mg via INTRAVENOUS

## 2019-01-06 MED ORDER — HEPARIN SODIUM (PORCINE) 1000 UNIT/ML IJ SOLN
INTRAMUSCULAR | Status: AC
  Filled 2019-01-06: qty 1

## 2019-01-06 MED ORDER — HEPARIN SODIUM (PORCINE) 1000 UNIT/ML IJ SOLN
INTRAMUSCULAR | Status: AC | PRN
  Administered 2019-01-06: 3000 [IU] via INTRAVENOUS

## 2019-01-06 MED ORDER — LIDOCAINE HCL 1 % IJ SOLN
INTRAMUSCULAR | Status: AC | PRN
  Administered 2019-01-06: 5 mL

## 2019-01-06 MED ORDER — DEXTROSE 50 % IV SOLN
INTRAVENOUS | Status: AC | PRN
  Administered 2019-01-06: 25 g via INTRAVENOUS

## 2019-01-06 MED ORDER — ALTEPLASE 2 MG IJ SOLR
INTRAMUSCULAR | Status: AC
  Filled 2019-01-06: qty 2

## 2019-01-06 MED ORDER — IOHEXOL 300 MG/ML  SOLN
100.0000 mL | Freq: Once | INTRAMUSCULAR | Status: AC | PRN
  Administered 2019-01-06: 14:00:00 35 mL via INTRAVENOUS

## 2019-01-06 MED ORDER — MIDAZOLAM HCL 2 MG/2ML IJ SOLN
INTRAMUSCULAR | Status: AC
  Filled 2019-01-06: qty 2

## 2019-01-06 MED ORDER — ALTEPLASE 2 MG IJ SOLR
INTRAMUSCULAR | Status: AC | PRN
  Administered 2019-01-06: 2 mg

## 2019-01-06 MED ORDER — LIDOCAINE HCL 1 % IJ SOLN
INTRAMUSCULAR | Status: AC
  Filled 2019-01-06: qty 20

## 2019-01-06 MED ORDER — FENTANYL CITRATE (PF) 100 MCG/2ML IJ SOLN
INTRAMUSCULAR | Status: AC
  Filled 2019-01-06: qty 2

## 2019-01-06 NOTE — Progress Notes (Signed)
CBG 42-- 2 glucose tablets given PA for radiology called and informed. Pt states the tablets usually don't  Work PA informed.  States Radiology will come soon.

## 2019-01-06 NOTE — Discharge Instructions (Signed)

## 2019-01-06 NOTE — Sedation Documentation (Signed)
Patient is resting comfortably. 

## 2019-01-06 NOTE — Progress Notes (Signed)
Rechecked pt blood sugar, cbg is 57. Pt states that he is prayed over so he is ok. Pt refuses glucose tablets. Will notify MD.

## 2019-01-06 NOTE — Sedation Documentation (Signed)
Pt moved to IR suite. Pt is still upset about not having IV access. Consoled pt.

## 2019-01-06 NOTE — Sedation Documentation (Signed)
Pt vitals stable, no sedation given at this time. MD attempting to gain access

## 2019-01-06 NOTE — H&P (Signed)
Chief Complaint: Patient was seen in consultation today for clotted left arm fistula  Referring Physician(s): [name],James  Supervising Physician: Arne Cleveland  Patient Status: [facility] - Out-pt  History of Present Illness: [name] is a 38 y.o. 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  Patient states he has had frequent difficulty with cannulation and flow with his fistula which has required multiple rounds of intervention.  He presents for declotting procedure in IR today.  He adamantly declines to have a temporary or tunneled catheter placed today.   He has been NPO.   Upon arrival to short stay, he was found to have hypoglyemia of 46.  After 2 glucose tablets, his sugar improved to 56.  Patient states he now feels fine and will not take any more tablets.  He is planning for IV access placement under image guidance due to difficulty vein/sticks.   Past Medical History:  Diagnosis Date  . Diabetes mellitus without complication ([place])   . Diabetic gastroparesis ([place])   . Dialysis patient ([place])   . Hypertension   . Renal disorder    Dialysis  . Sepsis [facility])     Past Surgical History:  Procedure Laterality Date  . AMPUTATION Right 02/02/2018   Procedure: RIGHT FIFTH TOE AND METATARSAL AMPUTATION. Filetted toe flap metatarsal resection. Debridement Plantar Foot wound;  Surgeon: Evelina Bucy, DPM;  Location: [place];  Service: Podiatry;  Laterality: Right;  . AMPUTATION Left 08/20/2018   Procedure: FIFTH METATARSAL BONE BIOPSY;  Surgeon: Evelina Bucy, DPM;  Location: [place];  Service: Podiatry;  Laterality: Left;  . AMPUTATION Left 10/28/2018   Procedure: LEFT GREAT TOE AMPUTATION;  Surgeon: Evelina Bucy, DPM;  Location: [place];  Service: Podiatry;  Laterality: Left;  . APPLICATION OF WOUND VAC  02/02/2018   Procedure: APPLICATION OF WOUND VAC  Right Foot;  Surgeon: Evelina Bucy, DPM;  Location: [place];  Service: Podiatry;;  . APPLICATION OF WOUND  VAC Left 10/28/2018   Procedure: APPLICATION OF WOUND VAC LEFT TOE;  Surgeon: Evelina Bucy, DPM;  Location: [place];  Service: Podiatry;  Laterality: Left;  . APPLICATION OF WOUND VAC Left 11/01/2018   Procedure: APPLICATION OF WOUND VAC;  Surgeon: Evelina Bucy, DPM;  Location: [place];  Service: Podiatry;  Laterality: Left;  . AV FISTULA PLACEMENT     left arm.  . AV FISTULA PLACEMENT Right 12/22/2016   Procedure: INSERTION OF ARTERIOVENOUS (AV) GORE-TEX GRAFT ARM;  Surgeon: Elam Dutch, MD;  Location: [facility] OR;  Service: Vascular;  Laterality: Right;  . AV FISTULA PLACEMENT Left 05/26/2018   Procedure: INSERTION OF  ARTERIOVENOUS (AV) GORE-TEX GRAFT LEFT ARM;  Surgeon: Serafina Mitchell, MD;  Location: [place];  Service: Vascular;  Laterality: Left;  . EYE SURGERY    . I&D EXTREMITY Right 10/31/2017   Procedure: IRRIGATION AND DEBRIDEMENT RIGHT FOOT;  Surgeon: Evelina Bucy, DPM;  Location: [place];  Service: Podiatry;  Laterality: Right;  . I&D EXTREMITY Left 08/20/2018   Procedure: IRRIGATION AND DEBRIDEMENT EXTREMITY WITH SECONDARY WOUND CLOSUREAND APPLICATION OF WOUND VAC LEFT FOOT;  Surgeon: Evelina Bucy, DPM;  Location: [place];  Service: Podiatry;  Laterality: Left;  . I&D EXTREMITY Left 10/20/2018   Procedure: IRRIGATION AND DEBRIDEMENT LEFT FOOT  DEBRIDEMENT LATERAL FOOT WOUND;  Surgeon: Evelina Bucy, DPM;  Location: [place];  Service: Podiatry;  Laterality: Left;  . I&D EXTREMITY Left 10/28/2018  Procedure: IRRIGATION AND DEBRIDEMENT LEFT TOE;  Surgeon: Evelina Bucy, DPM;  Location: [place];  Service: Podiatry;  Laterality: Left;  . INSERTION OF DIALYSIS CATHETER     Right subclavian  . IR AV DIALY SHUNT INTRO NEEDLE/INTRACATH INITIAL W/PTA/IMG RIGHT Right 02/05/2018  . IR THROMBECTOMY AV FISTULA W/THROMBOLYSIS/PTA INC/SHUNT/IMG LEFT Left 08/24/2018  . IR US GUIDE VASC ACCESS LEFT  08/24/2018  . IR US GUIDE VASC ACCESS RIGHT  02/05/2018  . IRRIGATION AND DEBRIDEMENT FOOT Right  10/23/2018   Procedure: Irrigation and Debridement to tendon, Left Foot;  Surgeon: Evelina Bucy, DPM;  Location: [place];  Service: Podiatry;  Laterality: Right;  . IRRIGATION AND DEBRIDEMENT FOOT Left 11/01/2018   Procedure: IRRIGATION AND DEBRIDEMENT PARTIAL WOUND CLOSURE LOCAL TISSUE TRANSFER AND FLAP ROTATION, LEFT FOOT;  Surgeon: Evelina Bucy, DPM;  Location: [place];  Service: Podiatry;  Laterality: Left;  . TRANSMETATARSAL AMPUTATION N/A 08/18/2018   Procedure: IRRIGATION AND DEBRIDEMENT OF LEFT 5TH TOE AND TRANSMETATARSAL, WITH PARTICAL LEFT 5TH TOE AND METATARSAL AMPUTATION, BONE BIOPSY, WOUND VAC APPLICATION.;  Surgeon: Evelina Bucy, DPM;  Location: [place];  Service: Podiatry;  Laterality: N/A;  . UPPER EXTREMITY VENOGRAPHY N/A 11/16/2016   Procedure: Upper Extremity Venography - Right Central;  Surgeon: Elam Dutch, MD;  Location: [place] CV LAB;  Service: Cardiovascular;  Laterality: N/A;  . UPPER EXTREMITY VENOGRAPHY N/A 05/25/2018   Procedure: UPPER EXTREMITY VENOGRAPHY - Bilateral;  Surgeon: Marty Heck, MD;  Location: [place] CV LAB;  Service: Cardiovascular;  Laterality: N/A;    Allergies: Patient has no known allergies.  Medications: Prior to Admission medications   Medication Sig Start Date End Date Taking? Authorizing Provider  calcitRIOL (ROCALTROL) 0.5 MCG capsule Take 5 capsules (2.5 mcg total) by mouth every Monday, Wednesday, and Friday with hemodialysis. 10/26/18  Yes Modena Jansky, MD  ceFAZolin (ANCEF) 2-4 GM/100ML-% IVPB Inject 100 mLs (2 g total) into the vein every Monday, Wednesday, and Friday with hemodialysis. 11/04/18  Yes Florencia Reasons, MD  cinacalcet (SENSIPAR) 30 MG tablet Take 30 mg by mouth daily with supper.   Yes [provider]  collagenase (SANTYL) ointment Apply 1 application topically daily. 08/23/18  Yes Elgergawy, Silver Huguenin, MD  insulin aspart (NOVOLOG) 100 UNIT/ML injection Inject 6-7 Units into the skin 2 (two) times  daily with a meal.    Yes [provider]  insulin glargine (LANTUS) 100 UNIT/ML injection Inject 0.15 mLs (15 Units total) into the skin at bedtime. 08/23/18  Yes Elgergawy, Silver Huguenin, MD  multivitamin (RENA-VIT) TABS tablet Take 1 tablet by mouth at bedtime. 10/24/18  Yes Hongalgi, Lenis Dickinson, MD  sevelamer carbonate (RENVELA) 800 MG tablet Take 2,400-3,200 mg by mouth See admin instructions. Take 3 - 4 tablets (2400 mg -3200 mg) by mouth two times daily with meals   Yes [provider]  clonazePAM (KLONOPIN) 0.5 MG tablet Take 0.5 mg by mouth daily as needed for anxiety (sleep).  07/21/17   [provider]  collagenase (SANTYL) ointment Apply 1 application topically daily. Left 1st metatarsal amputation site measuring 3.0 x 3.0 x 0.5cm 01/04/19   Price, Christian Mate, DPM  glucose blood ([employer] TEST STRIPS) test strip by Other route 4 (four) times a day as needed. 10/29/16   [provider]  HYDROcodone-acetaminophen (NORCO/VICODIN) 5-325 MG tablet Take 1-2 tablets by mouth every 6 (six) hours as needed for moderate pain or severe pain. 11/10/18   Evelina Bucy, DPM  ondansetron (ZOFRAN ODT) 4 MG disintegrating tablet Take 1 tablet (4 mg total) by mouth every 8 (eight) hours as needed for nausea or vomiting. 11/23/17   Ward, Ozella Almond, PA-C     Family History  Problem Relation Age of Onset  . Diabetes Mellitus II Other   . Diabetes Father   . Renal Disease Father        ESRD    Social History   Socioeconomic History  . Marital status: Single    Spouse name: Not on file  . Number of children: Not on file  . Years of education: Not on file  . Highest education level: Not on file  Occupational History  . Not on file  Social Needs  . Financial resource strain: Not on file  . Food insecurity:    Worry: Not on file    Inability: Not on file  . Transportation needs:    Medical: Not on file    Non-medical: Not on file  Tobacco Use  . Smoking status: Never  Smoker  . Smokeless tobacco: Never Used  Substance and Sexual Activity  . Alcohol use: No  . Drug use: No  . Sexual activity: Never  Lifestyle  . Physical activity:    Days per week: Not on file    Minutes per session: Not on file  . Stress: Not on file  Relationships  . Social connections:    Talks on phone: Not on file    Gets together: Not on file    Attends religious service: Not on file    Active member of club or organization: Not on file    Attends meetings of clubs or organizations: Not on file    Relationship status: Not on file  Other Topics Concern  . Not on file  Social History Narrative  . Not on file     Review of Systems: A 12 point ROS discussed and pertinent positives are indicated in the HPI above.  All other systems are negative.  Review of Systems  Constitutional: Negative for fatigue and fever.  Respiratory: Negative for cough and shortness of breath.   Cardiovascular: Negative for chest pain.  Gastrointestinal: Negative for abdominal pain.  Musculoskeletal: Negative for back pain.  Psychiatric/Behavioral: Negative for behavioral problems and confusion.    Vital Signs: BP (!) 149/107   Pulse 86   Temp 98 F (36.7 C) (Oral)   Resp 18   Ht 5\' 8"  (1.727 m)   Wt 185 lb (83.9 kg)   SpO2 100%   BMI 28.13 kg/m   Physical Exam Vitals signs and nursing note reviewed.  Constitutional:      Appearance: Normal appearance.  HENT:     Mouth/Throat:     Mouth: Mucous membranes are moist.     Pharynx: Oropharynx is clear.  Cardiovascular:     Rate and Rhythm: Normal rate and regular rhythm.  Pulmonary:     Effort: Pulmonary effort is normal. No respiratory distress.     Breath sounds: Normal breath sounds.  Skin:    General: Skin is warm and dry.  Neurological:     General: No focal deficit present.     Mental Status: He is alert and oriented to person, place, and time. Mental status is at baseline.  Psychiatric:        Mood and Affect: Mood  normal.        Thought Content: Thought content normal.     MD Evaluation Airway: WNL Heart: WNL  Abdomen: WNL Chest/ Lungs: WNL ASA  Classification: 3 Mallampati/Airway Score: One   Imaging: Dg Foot Complete Left  Result Date: 12/29/2018 Please see detailed radiograph report in office note.  Dg Foot Complete Left  Result Date: 12/08/2018 Please see detailed radiograph report in office note.   Labs:  CBC: Recent Labs    10/26/18 1716 10/27/18 0426 10/31/18 0757 11/02/18 1703  WBC 13.4* 9.6 10.9* 10.7*  HGB 10.0* 9.4* 9.2* 8.3*  HCT 33.3* 30.8* 30.8* 28.0*  PLT 182 212 295 281    COAGS: Recent Labs    02/05/18 0848 08/24/18 1014  INR 1.00 1.08    BMP: Recent Labs    10/27/18 0426 10/28/18 0800 10/31/18 0756 11/02/18 1736  NA 132* 132* 135 132*  K 4.1 4.8 4.8 6.5*  CL 91* 94* 95* 97*  CO2 29 26 25 22   GLUCOSE 405* 211* 96 195*  BUN 20 33* 38* 38*  CALCIUM 9.1 9.2 8.9 9.3  CREATININE 7.01* 9.01* 9.42* 10.34*  GFRNONAA 9* 7* 6* 6*  GFRAA 10* 8* 7* 6*    LIVER FUNCTION TESTS: Recent Labs    01/31/18 2248  10/20/18 1013  10/26/18 1716 10/28/18 0800 10/31/18 0756 11/02/18 1736  BILITOT 0.5  --  1.3*  --  0.7  --   --   --   AST 15  --  23  --  23  --   --   --   ALT 10*  --  14  --  5  --   --   --   ALKPHOS 120  --  162*  --  112  --   --   --   PROT 8.0  --  7.5  --  7.3  --   --   --   ALBUMIN 3.0*   < > 2.8*   < > 2.5* 2.1* 2.4* 2.5*   < > = values in this interval not displayed.    TUMOR MARKERS: No results for input(s): AFPTM, CEA, CA199, CHROMGRNA in the last 8760 hours.  Assessment and Plan: Patient with past medical history of end stage renal disease on dialysis presents with complaint of clotted access.  IR consulted for declotting procedure. Case reviewed by Dr. Vernard Gambles who approves patient for procedure.  Patient presents today in their usual state of health although he does have hypoglycemia on presentation. Some  improvement after glucose tabs, however will need IV and D50 which has been ordered. He has been NPO and is not currently on blood thinners.    Risks and benefits discussed with the patient including, but not limited to bleeding, infection, vascular injury, pulmonary embolism, or even death.    All of the patient's questions were answered, patient is agreeable to proceed. Consent signed and in chart.  Thank you for this interesting consult.  I greatly enjoyed meeting [name] and look forward to participating in their care.  A copy of this report was sent to the requesting provider on this date.  Electronically Signed: Docia Barrier, PA 01/06/2019, 1:05 PM   I spent a total of  30 Minutes   in face to face in clinical consultation, greater than 50% of which was counseling/coordinating care for clotted left arm fistula.

## 2019-01-06 NOTE — Progress Notes (Signed)
Radiology called and informed that his blood sugar will need to be checked 15 from time glucose tablets were given

## 2019-01-06 NOTE — Sedation Documentation (Signed)
Vital signs stable. 

## 2019-01-06 NOTE — Progress Notes (Signed)
Patient refused to let us check his blood sugar at this time. [facility] called and informed

## 2019-01-06 NOTE — Sedation Documentation (Signed)
Recheck cbg 182

## 2019-01-06 NOTE — Progress Notes (Signed)
No order for discharge present. Radiology nurse notified. Patient refuses to wait for order for discharge. MD notified.

## 2019-01-09 DIAGNOSIS — I120 Hypertensive chronic kidney disease with stage 5 chronic kidney disease or end stage renal disease: Secondary | ICD-10-CM | POA: Diagnosis not present

## 2019-01-09 DIAGNOSIS — N186 End stage renal disease: Secondary | ICD-10-CM | POA: Diagnosis not present

## 2019-01-09 DIAGNOSIS — E10621 Type 1 diabetes mellitus with foot ulcer: Secondary | ICD-10-CM | POA: Diagnosis not present

## 2019-01-09 DIAGNOSIS — T82868A Thrombosis of vascular prosthetic devices, implants and grafts, initial encounter: Secondary | ICD-10-CM | POA: Diagnosis not present

## 2019-01-09 DIAGNOSIS — D509 Iron deficiency anemia, unspecified: Secondary | ICD-10-CM | POA: Diagnosis not present

## 2019-01-09 DIAGNOSIS — E1022 Type 1 diabetes mellitus with diabetic chronic kidney disease: Secondary | ICD-10-CM | POA: Diagnosis not present

## 2019-01-09 DIAGNOSIS — N2581 Secondary hyperparathyroidism of renal origin: Secondary | ICD-10-CM | POA: Diagnosis not present

## 2019-01-09 DIAGNOSIS — I871 Compression of vein: Secondary | ICD-10-CM | POA: Diagnosis not present

## 2019-01-09 DIAGNOSIS — E1065 Type 1 diabetes mellitus with hyperglycemia: Secondary | ICD-10-CM | POA: Diagnosis not present

## 2019-01-09 DIAGNOSIS — R739 Hyperglycemia, unspecified: Secondary | ICD-10-CM | POA: Diagnosis not present

## 2019-01-09 DIAGNOSIS — L97424 Non-pressure chronic ulcer of left heel and midfoot with necrosis of bone: Secondary | ICD-10-CM | POA: Diagnosis not present

## 2019-01-09 DIAGNOSIS — T8142XA Infection following a procedure, deep incisional surgical site, initial encounter: Secondary | ICD-10-CM | POA: Diagnosis not present

## 2019-01-09 DIAGNOSIS — Z992 Dependence on renal dialysis: Secondary | ICD-10-CM | POA: Diagnosis not present

## 2019-01-11 ENCOUNTER — Telehealth: Payer: Self-pay | Admitting: *Deleted

## 2019-01-11 DIAGNOSIS — N186 End stage renal disease: Secondary | ICD-10-CM | POA: Diagnosis not present

## 2019-01-11 DIAGNOSIS — D509 Iron deficiency anemia, unspecified: Secondary | ICD-10-CM | POA: Diagnosis not present

## 2019-01-11 DIAGNOSIS — R739 Hyperglycemia, unspecified: Secondary | ICD-10-CM | POA: Diagnosis not present

## 2019-01-11 DIAGNOSIS — N2581 Secondary hyperparathyroidism of renal origin: Secondary | ICD-10-CM | POA: Diagnosis not present

## 2019-01-11 DIAGNOSIS — E1022 Type 1 diabetes mellitus with diabetic chronic kidney disease: Secondary | ICD-10-CM | POA: Diagnosis not present

## 2019-01-11 NOTE — Telephone Encounter (Signed)
[place] states at pt home health care visit pt cursed her out.

## 2019-01-12 ENCOUNTER — Encounter: Payer: Self-pay | Admitting: Podiatry

## 2019-01-12 ENCOUNTER — Ambulatory Visit (INDEPENDENT_AMBULATORY_CARE_PROVIDER_SITE_OTHER): Payer: Medicare Other | Admitting: Podiatry

## 2019-01-12 ENCOUNTER — Other Ambulatory Visit: Payer: Self-pay

## 2019-01-12 VITALS — Temp 97.2°F

## 2019-01-12 DIAGNOSIS — E08621 Diabetes mellitus due to underlying condition with foot ulcer: Secondary | ICD-10-CM | POA: Diagnosis not present

## 2019-01-12 DIAGNOSIS — R238 Other skin changes: Secondary | ICD-10-CM | POA: Diagnosis not present

## 2019-01-12 DIAGNOSIS — L97423 Non-pressure chronic ulcer of left heel and midfoot with necrosis of muscle: Secondary | ICD-10-CM | POA: Diagnosis not present

## 2019-01-12 NOTE — Progress Notes (Signed)
Subjective:  Patient ID: [name], male    DOB: 08-31-1981,  MRN: [id number]  Chief Complaint  Patient presents with   Wound Check    " I have a new blister on the side of my foot, that has been there since after last visit"    38 y.o. male presents for wound care follow-up.  Denies new complaints today.   Review of Systems: Negative except as noted in the HPI. Denies N/V/F/Ch.  Past Medical History:  Diagnosis Date   Diabetes mellitus without complication ([place])    Diabetic gastroparesis ([place])    Dialysis patient [facility])    Hypertension    Renal disorder    Dialysis   Sepsis [facility])     Current Outpatient Medications:    calcitRIOL (ROCALTROL) 0.5 MCG capsule, Take 5 capsules (2.5 mcg total) by mouth every Monday, Wednesday, and Friday with hemodialysis., Disp: 30 capsule, Rfl: 0   ceFAZolin (ANCEF) 2-4 GM/100ML-% IVPB, Inject 100 mLs (2 g total) into the vein every Monday, Wednesday, and Friday with hemodialysis., Disp: 1 each, Rfl: 7   cinacalcet (SENSIPAR) 30 MG tablet, Take 30 mg by mouth daily with supper., Disp: , Rfl:    clonazePAM (KLONOPIN) 0.5 MG tablet, Take 0.5 mg by mouth daily as needed for anxiety (sleep). , Disp: , Rfl:    collagenase (SANTYL) ointment, Apply 1 application topically daily., Disp: 15 g, Rfl: 0   collagenase (SANTYL) ointment, Apply 1 application topically daily. Left 1st metatarsal amputation site measuring 3.0 x 3.0 x 0.5cm, Disp: 15 g, Rfl: 5   glucose blood ([employer] TEST STRIPS) test strip, by Other route 4 (four) times a day as needed., Disp: , Rfl:    HYDROcodone-acetaminophen (NORCO/VICODIN) 5-325 MG tablet, Take 1-2 tablets by mouth every 6 (six) hours as needed for moderate pain or severe pain., Disp: 20 tablet, Rfl: 0   insulin aspart (NOVOLOG) 100 UNIT/ML injection, Inject 6-7 Units into the skin 2 (two) times daily with a meal. , Disp: , Rfl:    insulin glargine (LANTUS) 100 UNIT/ML injection, Inject 0.15 mLs (15 Units  total) into the skin at bedtime., Disp: 10 mL, Rfl: 1   multivitamin (RENA-VIT) TABS tablet, Take 1 tablet by mouth at bedtime., Disp: 30 tablet, Rfl: 0   ondansetron (ZOFRAN ODT) 4 MG disintegrating tablet, Take 1 tablet (4 mg total) by mouth every 8 (eight) hours as needed for nausea or vomiting., Disp: 10 tablet, Rfl: 0   sevelamer carbonate (RENVELA) 800 MG tablet, Take 2,400-3,200 mg by mouth See admin instructions. Take 3 - 4 tablets (2400 mg -3200 mg) by mouth two times daily with meals, Disp: , Rfl:   Social History   Tobacco Use  Smoking Status Never Smoker  Smokeless Tobacco Never Used    No Known Allergies Objective:   Vitals:   01/12/19 0843  Temp: (!) 97.2 F (36.2 C)   There is no height or weight on file to calculate BMI. Constitutional Well developed.  Vascular Left foot warm to touch  Neurologic Oriented to person, place, and time. Epicritic sensation to light touch grossly absent bilaterally.  Dermatologic 2x2.5 wound Left 1st metatarsal with fibrogranular base.  No warmth erythema signs of acute infection.  No exposed bone 2nd toe ulcer healed 3rd toe ulcer healing  Large bulla instep left foot with serous drainage upon Incision and drainage.  Orthopedic: Left hallux and fifth toe amputations noted.   Radiographs: None Assessment:   1. Diabetic ulcer of left midfoot  associated with diabetes mellitus due to underlying condition, with necrosis of muscle ([place])   2. Skin bulla    Plan:  Patient was evaluated and treated and all questions answered.  S/p L I&D of Abscess, Hallux Amputation, with debridement and wound subsequent partial closure -Wound improving. Continue santyl with HHC. -Dressed with santyl WTD.  Left 2nd/3rd Toe wound -Healed  Bulla Left ankle -After local skin prep incised with a 10 blade with return of serous drainage. Roof left intact. Dressed with betadine WTD.   Return in about 2 weeks (around 01/26/2019).

## 2019-01-12 NOTE — Telephone Encounter (Signed)
Noted thanks °

## 2019-01-13 DIAGNOSIS — L97424 Non-pressure chronic ulcer of left heel and midfoot with necrosis of bone: Secondary | ICD-10-CM | POA: Diagnosis not present

## 2019-01-13 DIAGNOSIS — N186 End stage renal disease: Secondary | ICD-10-CM | POA: Diagnosis not present

## 2019-01-13 DIAGNOSIS — E1065 Type 1 diabetes mellitus with hyperglycemia: Secondary | ICD-10-CM | POA: Diagnosis not present

## 2019-01-13 DIAGNOSIS — I120 Hypertensive chronic kidney disease with stage 5 chronic kidney disease or end stage renal disease: Secondary | ICD-10-CM | POA: Diagnosis not present

## 2019-01-13 DIAGNOSIS — E10621 Type 1 diabetes mellitus with foot ulcer: Secondary | ICD-10-CM | POA: Diagnosis not present

## 2019-01-13 DIAGNOSIS — T8142XA Infection following a procedure, deep incisional surgical site, initial encounter: Secondary | ICD-10-CM | POA: Diagnosis not present

## 2019-01-13 DIAGNOSIS — R739 Hyperglycemia, unspecified: Secondary | ICD-10-CM | POA: Diagnosis not present

## 2019-01-13 DIAGNOSIS — D509 Iron deficiency anemia, unspecified: Secondary | ICD-10-CM | POA: Diagnosis not present

## 2019-01-13 DIAGNOSIS — N2581 Secondary hyperparathyroidism of renal origin: Secondary | ICD-10-CM | POA: Diagnosis not present

## 2019-01-13 DIAGNOSIS — E1022 Type 1 diabetes mellitus with diabetic chronic kidney disease: Secondary | ICD-10-CM | POA: Diagnosis not present

## 2019-01-16 ENCOUNTER — Other Ambulatory Visit: Payer: Self-pay | Admitting: *Deleted

## 2019-01-16 DIAGNOSIS — T8142XA Infection following a procedure, deep incisional surgical site, initial encounter: Secondary | ICD-10-CM | POA: Diagnosis not present

## 2019-01-16 DIAGNOSIS — D509 Iron deficiency anemia, unspecified: Secondary | ICD-10-CM | POA: Diagnosis not present

## 2019-01-16 DIAGNOSIS — E1065 Type 1 diabetes mellitus with hyperglycemia: Secondary | ICD-10-CM | POA: Diagnosis not present

## 2019-01-16 DIAGNOSIS — L97424 Non-pressure chronic ulcer of left heel and midfoot with necrosis of bone: Secondary | ICD-10-CM | POA: Diagnosis not present

## 2019-01-16 DIAGNOSIS — N2581 Secondary hyperparathyroidism of renal origin: Secondary | ICD-10-CM | POA: Diagnosis not present

## 2019-01-16 DIAGNOSIS — R739 Hyperglycemia, unspecified: Secondary | ICD-10-CM | POA: Diagnosis not present

## 2019-01-16 DIAGNOSIS — I120 Hypertensive chronic kidney disease with stage 5 chronic kidney disease or end stage renal disease: Secondary | ICD-10-CM | POA: Diagnosis not present

## 2019-01-16 DIAGNOSIS — E1022 Type 1 diabetes mellitus with diabetic chronic kidney disease: Secondary | ICD-10-CM | POA: Diagnosis not present

## 2019-01-16 DIAGNOSIS — N186 End stage renal disease: Secondary | ICD-10-CM | POA: Diagnosis not present

## 2019-01-16 DIAGNOSIS — E10621 Type 1 diabetes mellitus with foot ulcer: Secondary | ICD-10-CM | POA: Diagnosis not present

## 2019-01-16 NOTE — Patient Outreach (Addendum)
[facility]) Care Management  01/16/2019  [name] 07-01-1981 [id number]  Successful outreach to [name] for Medicare high risk screening call.  2 HIPAA identifiers verified and purpose of call explained.  Subjective:  [name] says he is not interested in hearing about [place] Management services and says he already has a home health nurse coming to his house for foot wound and he doesn't want to have to talk with any more nurses. He also said he does not want any information mailed to his home.    Plan:  Will close to [place] [facility] services.   Barrington Ellison RN,CCM,CDE [place] Management Coordinator Office Phone [phone] Office Fax [phone]

## 2019-01-17 ENCOUNTER — Telehealth: Payer: Self-pay | Admitting: *Deleted

## 2019-01-17 NOTE — Telephone Encounter (Signed)
No need for santyl to the toe. Continue betadine to blistered area.

## 2019-01-17 NOTE — Telephone Encounter (Signed)
[facility] - [name], RN states Dr. March Rummage popped the blister and it appears has painted the area with betadine, does he want to continue the betadine to the non-draining blister area, and the left 4th toe is dry should the santyl continue? [name], RN request any new wound care orders.

## 2019-01-17 NOTE — Telephone Encounter (Signed)
Left message informing [name], [place] of [facility] wound care orders of 01/17/2019 at 3:46pm.

## 2019-01-18 DIAGNOSIS — T8142XA Infection following a procedure, deep incisional surgical site, initial encounter: Secondary | ICD-10-CM | POA: Diagnosis not present

## 2019-01-18 DIAGNOSIS — D509 Iron deficiency anemia, unspecified: Secondary | ICD-10-CM | POA: Diagnosis not present

## 2019-01-18 DIAGNOSIS — E1022 Type 1 diabetes mellitus with diabetic chronic kidney disease: Secondary | ICD-10-CM | POA: Diagnosis not present

## 2019-01-18 DIAGNOSIS — N2581 Secondary hyperparathyroidism of renal origin: Secondary | ICD-10-CM | POA: Diagnosis not present

## 2019-01-18 DIAGNOSIS — E10621 Type 1 diabetes mellitus with foot ulcer: Secondary | ICD-10-CM | POA: Diagnosis not present

## 2019-01-18 DIAGNOSIS — I120 Hypertensive chronic kidney disease with stage 5 chronic kidney disease or end stage renal disease: Secondary | ICD-10-CM | POA: Diagnosis not present

## 2019-01-18 DIAGNOSIS — L97424 Non-pressure chronic ulcer of left heel and midfoot with necrosis of bone: Secondary | ICD-10-CM | POA: Diagnosis not present

## 2019-01-18 DIAGNOSIS — E1065 Type 1 diabetes mellitus with hyperglycemia: Secondary | ICD-10-CM | POA: Diagnosis not present

## 2019-01-18 DIAGNOSIS — R739 Hyperglycemia, unspecified: Secondary | ICD-10-CM | POA: Diagnosis not present

## 2019-01-18 DIAGNOSIS — N186 End stage renal disease: Secondary | ICD-10-CM | POA: Diagnosis not present

## 2019-01-20 DIAGNOSIS — N2581 Secondary hyperparathyroidism of renal origin: Secondary | ICD-10-CM | POA: Diagnosis not present

## 2019-01-20 DIAGNOSIS — R739 Hyperglycemia, unspecified: Secondary | ICD-10-CM | POA: Diagnosis not present

## 2019-01-20 DIAGNOSIS — E1022 Type 1 diabetes mellitus with diabetic chronic kidney disease: Secondary | ICD-10-CM | POA: Diagnosis not present

## 2019-01-20 DIAGNOSIS — T8142XA Infection following a procedure, deep incisional surgical site, initial encounter: Secondary | ICD-10-CM | POA: Diagnosis not present

## 2019-01-20 DIAGNOSIS — L97424 Non-pressure chronic ulcer of left heel and midfoot with necrosis of bone: Secondary | ICD-10-CM | POA: Diagnosis not present

## 2019-01-20 DIAGNOSIS — E10621 Type 1 diabetes mellitus with foot ulcer: Secondary | ICD-10-CM | POA: Diagnosis not present

## 2019-01-20 DIAGNOSIS — E1065 Type 1 diabetes mellitus with hyperglycemia: Secondary | ICD-10-CM | POA: Diagnosis not present

## 2019-01-20 DIAGNOSIS — D509 Iron deficiency anemia, unspecified: Secondary | ICD-10-CM | POA: Diagnosis not present

## 2019-01-20 DIAGNOSIS — N186 End stage renal disease: Secondary | ICD-10-CM | POA: Diagnosis not present

## 2019-01-20 DIAGNOSIS — I120 Hypertensive chronic kidney disease with stage 5 chronic kidney disease or end stage renal disease: Secondary | ICD-10-CM | POA: Diagnosis not present

## 2019-01-23 DIAGNOSIS — T8142XA Infection following a procedure, deep incisional surgical site, initial encounter: Secondary | ICD-10-CM | POA: Diagnosis not present

## 2019-01-23 DIAGNOSIS — R739 Hyperglycemia, unspecified: Secondary | ICD-10-CM | POA: Diagnosis not present

## 2019-01-23 DIAGNOSIS — L97424 Non-pressure chronic ulcer of left heel and midfoot with necrosis of bone: Secondary | ICD-10-CM | POA: Diagnosis not present

## 2019-01-23 DIAGNOSIS — E10621 Type 1 diabetes mellitus with foot ulcer: Secondary | ICD-10-CM | POA: Diagnosis not present

## 2019-01-23 DIAGNOSIS — D509 Iron deficiency anemia, unspecified: Secondary | ICD-10-CM | POA: Diagnosis not present

## 2019-01-23 DIAGNOSIS — N2581 Secondary hyperparathyroidism of renal origin: Secondary | ICD-10-CM | POA: Diagnosis not present

## 2019-01-23 DIAGNOSIS — I120 Hypertensive chronic kidney disease with stage 5 chronic kidney disease or end stage renal disease: Secondary | ICD-10-CM | POA: Diagnosis not present

## 2019-01-23 DIAGNOSIS — E1022 Type 1 diabetes mellitus with diabetic chronic kidney disease: Secondary | ICD-10-CM | POA: Diagnosis not present

## 2019-01-23 DIAGNOSIS — N186 End stage renal disease: Secondary | ICD-10-CM | POA: Diagnosis not present

## 2019-01-23 DIAGNOSIS — E1065 Type 1 diabetes mellitus with hyperglycemia: Secondary | ICD-10-CM | POA: Diagnosis not present

## 2019-01-25 DIAGNOSIS — E1022 Type 1 diabetes mellitus with diabetic chronic kidney disease: Secondary | ICD-10-CM | POA: Diagnosis not present

## 2019-01-25 DIAGNOSIS — N2581 Secondary hyperparathyroidism of renal origin: Secondary | ICD-10-CM | POA: Diagnosis not present

## 2019-01-25 DIAGNOSIS — E1065 Type 1 diabetes mellitus with hyperglycemia: Secondary | ICD-10-CM | POA: Diagnosis not present

## 2019-01-25 DIAGNOSIS — T8142XA Infection following a procedure, deep incisional surgical site, initial encounter: Secondary | ICD-10-CM | POA: Diagnosis not present

## 2019-01-25 DIAGNOSIS — I120 Hypertensive chronic kidney disease with stage 5 chronic kidney disease or end stage renal disease: Secondary | ICD-10-CM | POA: Diagnosis not present

## 2019-01-25 DIAGNOSIS — E10621 Type 1 diabetes mellitus with foot ulcer: Secondary | ICD-10-CM | POA: Diagnosis not present

## 2019-01-25 DIAGNOSIS — L97424 Non-pressure chronic ulcer of left heel and midfoot with necrosis of bone: Secondary | ICD-10-CM | POA: Diagnosis not present

## 2019-01-25 DIAGNOSIS — D509 Iron deficiency anemia, unspecified: Secondary | ICD-10-CM | POA: Diagnosis not present

## 2019-01-25 DIAGNOSIS — N186 End stage renal disease: Secondary | ICD-10-CM | POA: Diagnosis not present

## 2019-01-25 DIAGNOSIS — R739 Hyperglycemia, unspecified: Secondary | ICD-10-CM | POA: Diagnosis not present

## 2019-01-26 ENCOUNTER — Ambulatory Visit (INDEPENDENT_AMBULATORY_CARE_PROVIDER_SITE_OTHER): Payer: Medicare Other | Admitting: Podiatry

## 2019-01-26 ENCOUNTER — Other Ambulatory Visit: Payer: Self-pay

## 2019-01-26 ENCOUNTER — Encounter: Payer: Self-pay | Admitting: Podiatry

## 2019-01-26 VITALS — Temp 97.5°F

## 2019-01-26 DIAGNOSIS — S90821D Blister (nonthermal), right foot, subsequent encounter: Secondary | ICD-10-CM

## 2019-01-26 DIAGNOSIS — L97423 Non-pressure chronic ulcer of left heel and midfoot with necrosis of muscle: Principal | ICD-10-CM

## 2019-01-26 DIAGNOSIS — E08621 Diabetes mellitus due to underlying condition with foot ulcer: Secondary | ICD-10-CM

## 2019-01-26 NOTE — Progress Notes (Signed)
Subjective:  Patient ID: [name], male    DOB: 11-10-1980,  MRN: [id number]  Chief Complaint  Patient presents with   Wound Check    L foot ulcer check; "doing alright, with pain and discomfort some days"   38 y.o. male presents for wound care follow-up.  Denies new complaints today.   Review of Systems: Negative except as noted in the HPI. Denies N/V/F/Ch.  Past Medical History:  Diagnosis Date   Diabetes mellitus without complication ([place])    Diabetic gastroparesis ([place])    Dialysis patient [facility])    Hypertension    Renal disorder    Dialysis   Sepsis [facility])     Current Outpatient Medications:    calcitRIOL (ROCALTROL) 0.5 MCG capsule, Take 5 capsules (2.5 mcg total) by mouth every Monday, Wednesday, and Friday with hemodialysis., Disp: 30 capsule, Rfl: 0   ceFAZolin (ANCEF) 2-4 GM/100ML-% IVPB, Inject 100 mLs (2 g total) into the vein every Monday, Wednesday, and Friday with hemodialysis., Disp: 1 each, Rfl: 7   cinacalcet (SENSIPAR) 30 MG tablet, Take 30 mg by mouth daily with supper., Disp: , Rfl:    clonazePAM (KLONOPIN) 0.5 MG tablet, Take 0.5 mg by mouth daily as needed for anxiety (sleep). , Disp: , Rfl:    collagenase (SANTYL) ointment, Apply 1 application topically daily., Disp: 15 g, Rfl: 0   collagenase (SANTYL) ointment, Apply 1 application topically daily. Left 1st metatarsal amputation site measuring 3.0 x 3.0 x 0.5cm, Disp: 15 g, Rfl: 5   glucose blood ([employer] TEST STRIPS) test strip, by Other route 4 (four) times a day as needed., Disp: , Rfl:    HYDROcodone-acetaminophen (NORCO/VICODIN) 5-325 MG tablet, Take 1-2 tablets by mouth every 6 (six) hours as needed for moderate pain or severe pain., Disp: 20 tablet, Rfl: 0   insulin aspart (NOVOLOG) 100 UNIT/ML injection, Inject 6-7 Units into the skin 2 (two) times daily with a meal. , Disp: , Rfl:    insulin glargine (LANTUS) 100 UNIT/ML injection, Inject 0.15 mLs (15 Units total) into the skin at  bedtime., Disp: 10 mL, Rfl: 1   multivitamin (RENA-VIT) TABS tablet, Take 1 tablet by mouth at bedtime., Disp: 30 tablet, Rfl: 0   ondansetron (ZOFRAN ODT) 4 MG disintegrating tablet, Take 1 tablet (4 mg total) by mouth every 8 (eight) hours as needed for nausea or vomiting., Disp: 10 tablet, Rfl: 0   sevelamer carbonate (RENVELA) 800 MG tablet, Take 2,400-3,200 mg by mouth See admin instructions. Take 3 - 4 tablets (2400 mg -3200 mg) by mouth two times daily with meals, Disp: , Rfl:    traMADol (ULTRAM) 50 MG tablet, , Disp: , Rfl:   Social History   Tobacco Use  Smoking Status Never Smoker  Smokeless Tobacco Never Used    No Known Allergies Objective:   Vitals:   01/26/19 0904  Temp: (!) 97.5 F (36.4 C)   There is no height or weight on file to calculate BMI. Constitutional Well developed.  Vascular Left foot warm to touch  Neurologic Oriented to person, place, and time. Epicritic sensation to light touch grossly absent bilaterally.  Dermatologic 2x2 wound Left 1st metatarsal with fibrous base minimal hyperkatosis. No warmth erythema signs of acute infection.  No exposed bone   2nd, 3rd toe ulcers healed. Blister healed   Large bulla instep left foot with serous drainage upon Incision and drainage.  Orthopedic: Left hallux and fifth toe amputations noted.   Radiographs: None Assessment:  1. Diabetic ulcer of left midfoot associated with diabetes mellitus due to underlying condition, with necrosis of muscle ([place])    Plan:  Patient was evaluated and treated and all questions answered.  S/p L I&D of Abscess, Hallux Amputation, with debridement and wound subsequent partial closure -Wound improving. Debrided with tissue nipper of excess hyperkeratotic tissue. Covered under global. -Dressed with santyl WTD.  Bulla left ankle -Healing well. Bulla dry, left intact.   Return in about 2 weeks (around 02/09/2019).

## 2019-01-27 DIAGNOSIS — N2581 Secondary hyperparathyroidism of renal origin: Secondary | ICD-10-CM | POA: Diagnosis not present

## 2019-01-27 DIAGNOSIS — E1065 Type 1 diabetes mellitus with hyperglycemia: Secondary | ICD-10-CM | POA: Diagnosis not present

## 2019-01-27 DIAGNOSIS — D509 Iron deficiency anemia, unspecified: Secondary | ICD-10-CM | POA: Diagnosis not present

## 2019-01-27 DIAGNOSIS — T8142XA Infection following a procedure, deep incisional surgical site, initial encounter: Secondary | ICD-10-CM | POA: Diagnosis not present

## 2019-01-27 DIAGNOSIS — I120 Hypertensive chronic kidney disease with stage 5 chronic kidney disease or end stage renal disease: Secondary | ICD-10-CM | POA: Diagnosis not present

## 2019-01-27 DIAGNOSIS — I129 Hypertensive chronic kidney disease with stage 1 through stage 4 chronic kidney disease, or unspecified chronic kidney disease: Secondary | ICD-10-CM | POA: Diagnosis not present

## 2019-01-27 DIAGNOSIS — N186 End stage renal disease: Secondary | ICD-10-CM | POA: Diagnosis not present

## 2019-01-27 DIAGNOSIS — E1022 Type 1 diabetes mellitus with diabetic chronic kidney disease: Secondary | ICD-10-CM | POA: Diagnosis not present

## 2019-01-27 DIAGNOSIS — E10621 Type 1 diabetes mellitus with foot ulcer: Secondary | ICD-10-CM | POA: Diagnosis not present

## 2019-01-27 DIAGNOSIS — L97424 Non-pressure chronic ulcer of left heel and midfoot with necrosis of bone: Secondary | ICD-10-CM | POA: Diagnosis not present

## 2019-01-27 DIAGNOSIS — R739 Hyperglycemia, unspecified: Secondary | ICD-10-CM | POA: Diagnosis not present

## 2019-01-27 DIAGNOSIS — Z992 Dependence on renal dialysis: Secondary | ICD-10-CM | POA: Diagnosis not present

## 2019-01-30 DIAGNOSIS — N186 End stage renal disease: Secondary | ICD-10-CM | POA: Diagnosis not present

## 2019-01-30 DIAGNOSIS — E1022 Type 1 diabetes mellitus with diabetic chronic kidney disease: Secondary | ICD-10-CM | POA: Diagnosis not present

## 2019-01-30 DIAGNOSIS — L97424 Non-pressure chronic ulcer of left heel and midfoot with necrosis of bone: Secondary | ICD-10-CM | POA: Diagnosis not present

## 2019-01-30 DIAGNOSIS — E10621 Type 1 diabetes mellitus with foot ulcer: Secondary | ICD-10-CM | POA: Diagnosis not present

## 2019-01-30 DIAGNOSIS — E1065 Type 1 diabetes mellitus with hyperglycemia: Secondary | ICD-10-CM | POA: Diagnosis not present

## 2019-01-30 DIAGNOSIS — I120 Hypertensive chronic kidney disease with stage 5 chronic kidney disease or end stage renal disease: Secondary | ICD-10-CM | POA: Diagnosis not present

## 2019-01-30 DIAGNOSIS — T8142XA Infection following a procedure, deep incisional surgical site, initial encounter: Secondary | ICD-10-CM | POA: Diagnosis not present

## 2019-01-30 DIAGNOSIS — R739 Hyperglycemia, unspecified: Secondary | ICD-10-CM | POA: Diagnosis not present

## 2019-01-30 DIAGNOSIS — D509 Iron deficiency anemia, unspecified: Secondary | ICD-10-CM | POA: Diagnosis not present

## 2019-01-30 DIAGNOSIS — N2581 Secondary hyperparathyroidism of renal origin: Secondary | ICD-10-CM | POA: Diagnosis not present

## 2019-02-01 DIAGNOSIS — E1065 Type 1 diabetes mellitus with hyperglycemia: Secondary | ICD-10-CM | POA: Diagnosis not present

## 2019-02-01 DIAGNOSIS — T8142XA Infection following a procedure, deep incisional surgical site, initial encounter: Secondary | ICD-10-CM | POA: Diagnosis not present

## 2019-02-01 DIAGNOSIS — I120 Hypertensive chronic kidney disease with stage 5 chronic kidney disease or end stage renal disease: Secondary | ICD-10-CM | POA: Diagnosis not present

## 2019-02-01 DIAGNOSIS — N2581 Secondary hyperparathyroidism of renal origin: Secondary | ICD-10-CM | POA: Diagnosis not present

## 2019-02-01 DIAGNOSIS — N186 End stage renal disease: Secondary | ICD-10-CM | POA: Diagnosis not present

## 2019-02-01 DIAGNOSIS — E10621 Type 1 diabetes mellitus with foot ulcer: Secondary | ICD-10-CM | POA: Diagnosis not present

## 2019-02-01 DIAGNOSIS — L97424 Non-pressure chronic ulcer of left heel and midfoot with necrosis of bone: Secondary | ICD-10-CM | POA: Diagnosis not present

## 2019-02-01 DIAGNOSIS — D509 Iron deficiency anemia, unspecified: Secondary | ICD-10-CM | POA: Diagnosis not present

## 2019-02-01 DIAGNOSIS — E1022 Type 1 diabetes mellitus with diabetic chronic kidney disease: Secondary | ICD-10-CM | POA: Diagnosis not present

## 2019-02-01 DIAGNOSIS — R739 Hyperglycemia, unspecified: Secondary | ICD-10-CM | POA: Diagnosis not present

## 2019-02-03 DIAGNOSIS — E1022 Type 1 diabetes mellitus with diabetic chronic kidney disease: Secondary | ICD-10-CM | POA: Diagnosis not present

## 2019-02-03 DIAGNOSIS — E1065 Type 1 diabetes mellitus with hyperglycemia: Secondary | ICD-10-CM | POA: Diagnosis not present

## 2019-02-03 DIAGNOSIS — I120 Hypertensive chronic kidney disease with stage 5 chronic kidney disease or end stage renal disease: Secondary | ICD-10-CM | POA: Diagnosis not present

## 2019-02-03 DIAGNOSIS — D509 Iron deficiency anemia, unspecified: Secondary | ICD-10-CM | POA: Diagnosis not present

## 2019-02-03 DIAGNOSIS — N186 End stage renal disease: Secondary | ICD-10-CM | POA: Diagnosis not present

## 2019-02-03 DIAGNOSIS — N2581 Secondary hyperparathyroidism of renal origin: Secondary | ICD-10-CM | POA: Diagnosis not present

## 2019-02-03 DIAGNOSIS — T8142XA Infection following a procedure, deep incisional surgical site, initial encounter: Secondary | ICD-10-CM | POA: Diagnosis not present

## 2019-02-03 DIAGNOSIS — E10621 Type 1 diabetes mellitus with foot ulcer: Secondary | ICD-10-CM | POA: Diagnosis not present

## 2019-02-03 DIAGNOSIS — L97424 Non-pressure chronic ulcer of left heel and midfoot with necrosis of bone: Secondary | ICD-10-CM | POA: Diagnosis not present

## 2019-02-03 DIAGNOSIS — R739 Hyperglycemia, unspecified: Secondary | ICD-10-CM | POA: Diagnosis not present

## 2019-02-05 DIAGNOSIS — E1065 Type 1 diabetes mellitus with hyperglycemia: Secondary | ICD-10-CM | POA: Diagnosis not present

## 2019-02-05 DIAGNOSIS — L97424 Non-pressure chronic ulcer of left heel and midfoot with necrosis of bone: Secondary | ICD-10-CM | POA: Diagnosis not present

## 2019-02-05 DIAGNOSIS — N186 End stage renal disease: Secondary | ICD-10-CM | POA: Diagnosis not present

## 2019-02-05 DIAGNOSIS — Z992 Dependence on renal dialysis: Secondary | ICD-10-CM | POA: Diagnosis not present

## 2019-02-05 DIAGNOSIS — Z4800 Encounter for change or removal of nonsurgical wound dressing: Secondary | ICD-10-CM | POA: Diagnosis not present

## 2019-02-05 DIAGNOSIS — E10621 Type 1 diabetes mellitus with foot ulcer: Secondary | ICD-10-CM | POA: Diagnosis not present

## 2019-02-05 DIAGNOSIS — E1042 Type 1 diabetes mellitus with diabetic polyneuropathy: Secondary | ICD-10-CM | POA: Diagnosis not present

## 2019-02-05 DIAGNOSIS — Z8631 Personal history of diabetic foot ulcer: Secondary | ICD-10-CM | POA: Diagnosis not present

## 2019-02-05 DIAGNOSIS — I120 Hypertensive chronic kidney disease with stage 5 chronic kidney disease or end stage renal disease: Secondary | ICD-10-CM | POA: Diagnosis not present

## 2019-02-05 DIAGNOSIS — D631 Anemia in chronic kidney disease: Secondary | ICD-10-CM | POA: Diagnosis not present

## 2019-02-05 DIAGNOSIS — T8142XA Infection following a procedure, deep incisional surgical site, initial encounter: Secondary | ICD-10-CM | POA: Diagnosis not present

## 2019-02-05 DIAGNOSIS — E1022 Type 1 diabetes mellitus with diabetic chronic kidney disease: Secondary | ICD-10-CM | POA: Diagnosis not present

## 2019-02-06 DIAGNOSIS — E10621 Type 1 diabetes mellitus with foot ulcer: Secondary | ICD-10-CM | POA: Diagnosis not present

## 2019-02-06 DIAGNOSIS — L97424 Non-pressure chronic ulcer of left heel and midfoot with necrosis of bone: Secondary | ICD-10-CM | POA: Diagnosis not present

## 2019-02-06 DIAGNOSIS — E1065 Type 1 diabetes mellitus with hyperglycemia: Secondary | ICD-10-CM | POA: Diagnosis not present

## 2019-02-06 DIAGNOSIS — N186 End stage renal disease: Secondary | ICD-10-CM | POA: Diagnosis not present

## 2019-02-06 DIAGNOSIS — T8142XA Infection following a procedure, deep incisional surgical site, initial encounter: Secondary | ICD-10-CM | POA: Diagnosis not present

## 2019-02-06 DIAGNOSIS — D509 Iron deficiency anemia, unspecified: Secondary | ICD-10-CM | POA: Diagnosis not present

## 2019-02-06 DIAGNOSIS — I120 Hypertensive chronic kidney disease with stage 5 chronic kidney disease or end stage renal disease: Secondary | ICD-10-CM | POA: Diagnosis not present

## 2019-02-06 DIAGNOSIS — E1022 Type 1 diabetes mellitus with diabetic chronic kidney disease: Secondary | ICD-10-CM | POA: Diagnosis not present

## 2019-02-06 DIAGNOSIS — N2581 Secondary hyperparathyroidism of renal origin: Secondary | ICD-10-CM | POA: Diagnosis not present

## 2019-02-06 DIAGNOSIS — R739 Hyperglycemia, unspecified: Secondary | ICD-10-CM | POA: Diagnosis not present

## 2019-02-08 DIAGNOSIS — R739 Hyperglycemia, unspecified: Secondary | ICD-10-CM | POA: Diagnosis not present

## 2019-02-08 DIAGNOSIS — N186 End stage renal disease: Secondary | ICD-10-CM | POA: Diagnosis not present

## 2019-02-08 DIAGNOSIS — E1022 Type 1 diabetes mellitus with diabetic chronic kidney disease: Secondary | ICD-10-CM | POA: Diagnosis not present

## 2019-02-08 DIAGNOSIS — D509 Iron deficiency anemia, unspecified: Secondary | ICD-10-CM | POA: Diagnosis not present

## 2019-02-08 DIAGNOSIS — E10621 Type 1 diabetes mellitus with foot ulcer: Secondary | ICD-10-CM | POA: Diagnosis not present

## 2019-02-08 DIAGNOSIS — N2581 Secondary hyperparathyroidism of renal origin: Secondary | ICD-10-CM | POA: Diagnosis not present

## 2019-02-08 DIAGNOSIS — E1065 Type 1 diabetes mellitus with hyperglycemia: Secondary | ICD-10-CM | POA: Diagnosis not present

## 2019-02-08 DIAGNOSIS — I120 Hypertensive chronic kidney disease with stage 5 chronic kidney disease or end stage renal disease: Secondary | ICD-10-CM | POA: Diagnosis not present

## 2019-02-08 DIAGNOSIS — T8142XA Infection following a procedure, deep incisional surgical site, initial encounter: Secondary | ICD-10-CM | POA: Diagnosis not present

## 2019-02-08 DIAGNOSIS — L97424 Non-pressure chronic ulcer of left heel and midfoot with necrosis of bone: Secondary | ICD-10-CM | POA: Diagnosis not present

## 2019-02-09 ENCOUNTER — Other Ambulatory Visit: Payer: Self-pay

## 2019-02-09 ENCOUNTER — Ambulatory Visit (INDEPENDENT_AMBULATORY_CARE_PROVIDER_SITE_OTHER): Payer: Medicare Other | Admitting: Podiatry

## 2019-02-09 DIAGNOSIS — S90821D Blister (nonthermal), right foot, subsequent encounter: Secondary | ICD-10-CM

## 2019-02-09 DIAGNOSIS — L97423 Non-pressure chronic ulcer of left heel and midfoot with necrosis of muscle: Secondary | ICD-10-CM | POA: Diagnosis not present

## 2019-02-09 DIAGNOSIS — E08621 Diabetes mellitus due to underlying condition with foot ulcer: Secondary | ICD-10-CM

## 2019-02-09 NOTE — Progress Notes (Signed)
Subjective:  Patient ID: [name], male    DOB: 11/16/1980,  MRN: [id number]  No chief complaint on file.  38 y.o. male presents for wound care follow-up.  Denies new complaints today.   Review of Systems: Negative except as noted in the HPI. Denies N/V/F/Ch.  Past Medical History:  Diagnosis Date  . Diabetes mellitus without complication ([place])   . Diabetic gastroparesis ([place])   . Dialysis patient ([place])   . Hypertension   . Renal disorder    Dialysis  . Sepsis ([place])     Current Outpatient Medications:  .  calcitRIOL (ROCALTROL) 0.5 MCG capsule, Take 5 capsules (2.5 mcg total) by mouth every Monday, Wednesday, and Friday with hemodialysis., Disp: 30 capsule, Rfl: 0 .  ceFAZolin (ANCEF) 2-4 GM/100ML-% IVPB, Inject 100 mLs (2 g total) into the vein every Monday, Wednesday, and Friday with hemodialysis., Disp: 1 each, Rfl: 7 .  cinacalcet (SENSIPAR) 30 MG tablet, Take 30 mg by mouth daily with supper., Disp: , Rfl:  .  clonazePAM (KLONOPIN) 0.5 MG tablet, Take 0.5 mg by mouth daily as needed for anxiety (sleep). , Disp: , Rfl:  .  collagenase (SANTYL) ointment, Apply 1 application topically daily., Disp: 15 g, Rfl: 0 .  collagenase (SANTYL) ointment, Apply 1 application topically daily. Left 1st metatarsal amputation site measuring 3.0 x 3.0 x 0.5cm, Disp: 15 g, Rfl: 5 .  glucose blood ([employer] TEST STRIPS) test strip, by Other route 4 (four) times a day as needed., Disp: , Rfl:  .  HYDROcodone-acetaminophen (NORCO/VICODIN) 5-325 MG tablet, Take 1-2 tablets by mouth every 6 (six) hours as needed for moderate pain or severe pain., Disp: 20 tablet, Rfl: 0 .  insulin aspart (NOVOLOG) 100 UNIT/ML injection, Inject 6-7 Units into the skin 2 (two) times daily with a meal. , Disp: , Rfl:  .  insulin glargine (LANTUS) 100 UNIT/ML injection, Inject 0.15 mLs (15 Units total) into the skin at bedtime., Disp: 10 mL, Rfl: 1 .  multivitamin (RENA-VIT) TABS tablet, Take 1 tablet by mouth at  bedtime., Disp: 30 tablet, Rfl: 0 .  ondansetron (ZOFRAN ODT) 4 MG disintegrating tablet, Take 1 tablet (4 mg total) by mouth every 8 (eight) hours as needed for nausea or vomiting., Disp: 10 tablet, Rfl: 0 .  sevelamer carbonate (RENVELA) 800 MG tablet, Take 2,400-3,200 mg by mouth See admin instructions. Take 3 - 4 tablets (2400 mg -3200 mg) by mouth two times daily with meals, Disp: , Rfl:  .  traMADol (ULTRAM) 50 MG tablet, , Disp: , Rfl:   Social History   Tobacco Use  Smoking Status Never Smoker  Smokeless Tobacco Never Used    No Known Allergies Objective:   There were no vitals filed for this visit. There is no height or weight on file to calculate BMI. Constitutional Well developed.  Vascular Left foot warm to touch  Neurologic Oriented to person, place, and time. Epicritic sensation to light touch grossly absent bilaterally.  Dermatologic Wound left 1st metatarsal area 1.5x1, granular wound base surrounding hyperkeratosis  Hyperkeratosis left third toe without open wound noted  Bulla left lower extremity at the arch appears healed no warmth erythema signs of infection  Orthopedic: Left hallux and fifth toe amputations noted.   Radiographs: None Assessment:   1. 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  S/p  L I&D of Abscess, Hallux Amputation, with debridement and wound subsequent partial closure -Wound debrided as below.  Procedure: Excisional Debridement of Wound Rationale: Removal of non-viable soft tissue from the wound to promote healing.  Anesthesia: none Pre-Debridement Wound Measurements: 1 cm x 1 cm x 0.2 cm  Post-Debridement Wound Measurements: 1.5 cm x 1 cm x 0.2 cm  Type of Debridement: Sharp Excisional Tissue Removed: Non-viable soft tissue Depth of Debridement: subcutaneous tissue. Technique:  Sharp excisional debridement to bleeding, viable wound base.  Dressing: Dry, sterile, compression dressing. Disposition: Patient tolerated procedure well. Patient to return in 1 week for follow-up.   Bulla left ankle -Healing well. Bulla dry, left intact.   Return in about 2 weeks (around 02/23/2019).

## 2019-02-10 DIAGNOSIS — N2581 Secondary hyperparathyroidism of renal origin: Secondary | ICD-10-CM | POA: Diagnosis not present

## 2019-02-10 DIAGNOSIS — E10621 Type 1 diabetes mellitus with foot ulcer: Secondary | ICD-10-CM | POA: Diagnosis not present

## 2019-02-10 DIAGNOSIS — E1022 Type 1 diabetes mellitus with diabetic chronic kidney disease: Secondary | ICD-10-CM | POA: Diagnosis not present

## 2019-02-10 DIAGNOSIS — I120 Hypertensive chronic kidney disease with stage 5 chronic kidney disease or end stage renal disease: Secondary | ICD-10-CM | POA: Diagnosis not present

## 2019-02-10 DIAGNOSIS — R739 Hyperglycemia, unspecified: Secondary | ICD-10-CM | POA: Diagnosis not present

## 2019-02-10 DIAGNOSIS — T8142XA Infection following a procedure, deep incisional surgical site, initial encounter: Secondary | ICD-10-CM | POA: Diagnosis not present

## 2019-02-10 DIAGNOSIS — E1065 Type 1 diabetes mellitus with hyperglycemia: Secondary | ICD-10-CM | POA: Diagnosis not present

## 2019-02-10 DIAGNOSIS — L97424 Non-pressure chronic ulcer of left heel and midfoot with necrosis of bone: Secondary | ICD-10-CM | POA: Diagnosis not present

## 2019-02-10 DIAGNOSIS — N186 End stage renal disease: Secondary | ICD-10-CM | POA: Diagnosis not present

## 2019-02-10 DIAGNOSIS — D509 Iron deficiency anemia, unspecified: Secondary | ICD-10-CM | POA: Diagnosis not present

## 2019-02-13 DIAGNOSIS — N186 End stage renal disease: Secondary | ICD-10-CM | POA: Diagnosis not present

## 2019-02-13 DIAGNOSIS — T8142XA Infection following a procedure, deep incisional surgical site, initial encounter: Secondary | ICD-10-CM | POA: Diagnosis not present

## 2019-02-13 DIAGNOSIS — E10621 Type 1 diabetes mellitus with foot ulcer: Secondary | ICD-10-CM | POA: Diagnosis not present

## 2019-02-13 DIAGNOSIS — N2581 Secondary hyperparathyroidism of renal origin: Secondary | ICD-10-CM | POA: Diagnosis not present

## 2019-02-13 DIAGNOSIS — L97424 Non-pressure chronic ulcer of left heel and midfoot with necrosis of bone: Secondary | ICD-10-CM | POA: Diagnosis not present

## 2019-02-13 DIAGNOSIS — D509 Iron deficiency anemia, unspecified: Secondary | ICD-10-CM | POA: Diagnosis not present

## 2019-02-13 DIAGNOSIS — E1065 Type 1 diabetes mellitus with hyperglycemia: Secondary | ICD-10-CM | POA: Diagnosis not present

## 2019-02-13 DIAGNOSIS — E1022 Type 1 diabetes mellitus with diabetic chronic kidney disease: Secondary | ICD-10-CM | POA: Diagnosis not present

## 2019-02-13 DIAGNOSIS — I120 Hypertensive chronic kidney disease with stage 5 chronic kidney disease or end stage renal disease: Secondary | ICD-10-CM | POA: Diagnosis not present

## 2019-02-13 DIAGNOSIS — R739 Hyperglycemia, unspecified: Secondary | ICD-10-CM | POA: Diagnosis not present

## 2019-02-15 DIAGNOSIS — D509 Iron deficiency anemia, unspecified: Secondary | ICD-10-CM | POA: Diagnosis not present

## 2019-02-15 DIAGNOSIS — N2581 Secondary hyperparathyroidism of renal origin: Secondary | ICD-10-CM | POA: Diagnosis not present

## 2019-02-15 DIAGNOSIS — T8142XA Infection following a procedure, deep incisional surgical site, initial encounter: Secondary | ICD-10-CM | POA: Diagnosis not present

## 2019-02-15 DIAGNOSIS — I120 Hypertensive chronic kidney disease with stage 5 chronic kidney disease or end stage renal disease: Secondary | ICD-10-CM | POA: Diagnosis not present

## 2019-02-15 DIAGNOSIS — E1065 Type 1 diabetes mellitus with hyperglycemia: Secondary | ICD-10-CM | POA: Diagnosis not present

## 2019-02-15 DIAGNOSIS — L97424 Non-pressure chronic ulcer of left heel and midfoot with necrosis of bone: Secondary | ICD-10-CM | POA: Diagnosis not present

## 2019-02-15 DIAGNOSIS — E10621 Type 1 diabetes mellitus with foot ulcer: Secondary | ICD-10-CM | POA: Diagnosis not present

## 2019-02-15 DIAGNOSIS — E1022 Type 1 diabetes mellitus with diabetic chronic kidney disease: Secondary | ICD-10-CM | POA: Diagnosis not present

## 2019-02-15 DIAGNOSIS — R739 Hyperglycemia, unspecified: Secondary | ICD-10-CM | POA: Diagnosis not present

## 2019-02-15 DIAGNOSIS — N186 End stage renal disease: Secondary | ICD-10-CM | POA: Diagnosis not present

## 2019-02-17 DIAGNOSIS — N186 End stage renal disease: Secondary | ICD-10-CM | POA: Diagnosis not present

## 2019-02-17 DIAGNOSIS — T8142XA Infection following a procedure, deep incisional surgical site, initial encounter: Secondary | ICD-10-CM | POA: Diagnosis not present

## 2019-02-17 DIAGNOSIS — L97424 Non-pressure chronic ulcer of left heel and midfoot with necrosis of bone: Secondary | ICD-10-CM | POA: Diagnosis not present

## 2019-02-17 DIAGNOSIS — E1022 Type 1 diabetes mellitus with diabetic chronic kidney disease: Secondary | ICD-10-CM | POA: Diagnosis not present

## 2019-02-17 DIAGNOSIS — E10621 Type 1 diabetes mellitus with foot ulcer: Secondary | ICD-10-CM | POA: Diagnosis not present

## 2019-02-17 DIAGNOSIS — E1065 Type 1 diabetes mellitus with hyperglycemia: Secondary | ICD-10-CM | POA: Diagnosis not present

## 2019-02-17 DIAGNOSIS — R739 Hyperglycemia, unspecified: Secondary | ICD-10-CM | POA: Diagnosis not present

## 2019-02-17 DIAGNOSIS — D509 Iron deficiency anemia, unspecified: Secondary | ICD-10-CM | POA: Diagnosis not present

## 2019-02-17 DIAGNOSIS — I120 Hypertensive chronic kidney disease with stage 5 chronic kidney disease or end stage renal disease: Secondary | ICD-10-CM | POA: Diagnosis not present

## 2019-02-17 DIAGNOSIS — N2581 Secondary hyperparathyroidism of renal origin: Secondary | ICD-10-CM | POA: Diagnosis not present

## 2019-02-20 DIAGNOSIS — E10621 Type 1 diabetes mellitus with foot ulcer: Secondary | ICD-10-CM | POA: Diagnosis not present

## 2019-02-20 DIAGNOSIS — I120 Hypertensive chronic kidney disease with stage 5 chronic kidney disease or end stage renal disease: Secondary | ICD-10-CM | POA: Diagnosis not present

## 2019-02-20 DIAGNOSIS — L97424 Non-pressure chronic ulcer of left heel and midfoot with necrosis of bone: Secondary | ICD-10-CM | POA: Diagnosis not present

## 2019-02-20 DIAGNOSIS — N2581 Secondary hyperparathyroidism of renal origin: Secondary | ICD-10-CM | POA: Diagnosis not present

## 2019-02-20 DIAGNOSIS — E1065 Type 1 diabetes mellitus with hyperglycemia: Secondary | ICD-10-CM | POA: Diagnosis not present

## 2019-02-20 DIAGNOSIS — D509 Iron deficiency anemia, unspecified: Secondary | ICD-10-CM | POA: Diagnosis not present

## 2019-02-20 DIAGNOSIS — N186 End stage renal disease: Secondary | ICD-10-CM | POA: Diagnosis not present

## 2019-02-20 DIAGNOSIS — T8142XA Infection following a procedure, deep incisional surgical site, initial encounter: Secondary | ICD-10-CM | POA: Diagnosis not present

## 2019-02-20 DIAGNOSIS — E1022 Type 1 diabetes mellitus with diabetic chronic kidney disease: Secondary | ICD-10-CM | POA: Diagnosis not present

## 2019-02-20 DIAGNOSIS — R739 Hyperglycemia, unspecified: Secondary | ICD-10-CM | POA: Diagnosis not present

## 2019-02-22 DIAGNOSIS — R739 Hyperglycemia, unspecified: Secondary | ICD-10-CM | POA: Diagnosis not present

## 2019-02-22 DIAGNOSIS — D509 Iron deficiency anemia, unspecified: Secondary | ICD-10-CM | POA: Diagnosis not present

## 2019-02-22 DIAGNOSIS — E1065 Type 1 diabetes mellitus with hyperglycemia: Secondary | ICD-10-CM | POA: Diagnosis not present

## 2019-02-22 DIAGNOSIS — N186 End stage renal disease: Secondary | ICD-10-CM | POA: Diagnosis not present

## 2019-02-22 DIAGNOSIS — T8142XA Infection following a procedure, deep incisional surgical site, initial encounter: Secondary | ICD-10-CM | POA: Diagnosis not present

## 2019-02-22 DIAGNOSIS — E10621 Type 1 diabetes mellitus with foot ulcer: Secondary | ICD-10-CM | POA: Diagnosis not present

## 2019-02-22 DIAGNOSIS — L97424 Non-pressure chronic ulcer of left heel and midfoot with necrosis of bone: Secondary | ICD-10-CM | POA: Diagnosis not present

## 2019-02-22 DIAGNOSIS — I120 Hypertensive chronic kidney disease with stage 5 chronic kidney disease or end stage renal disease: Secondary | ICD-10-CM | POA: Diagnosis not present

## 2019-02-22 DIAGNOSIS — E1022 Type 1 diabetes mellitus with diabetic chronic kidney disease: Secondary | ICD-10-CM | POA: Diagnosis not present

## 2019-02-22 DIAGNOSIS — N2581 Secondary hyperparathyroidism of renal origin: Secondary | ICD-10-CM | POA: Diagnosis not present

## 2019-02-23 ENCOUNTER — Other Ambulatory Visit: Payer: Self-pay

## 2019-02-23 ENCOUNTER — Ambulatory Visit (INDEPENDENT_AMBULATORY_CARE_PROVIDER_SITE_OTHER): Payer: Medicare Other | Admitting: Podiatry

## 2019-02-23 DIAGNOSIS — L97423 Non-pressure chronic ulcer of left heel and midfoot with necrosis of muscle: Secondary | ICD-10-CM | POA: Diagnosis not present

## 2019-02-23 DIAGNOSIS — E08621 Diabetes mellitus due to underlying condition with foot ulcer: Secondary | ICD-10-CM | POA: Diagnosis not present

## 2019-02-23 NOTE — Progress Notes (Addendum)
Subjective:  Patient ID: [name], male    DOB: Jul 04, 1981,  MRN: [id number]  No chief complaint on file.  38 y.o. male presents for wound care follow-up.  Denies new complaints today from his feet. Had some bleeding from his HD fistula asking for bandaging to be applied to it.  Review of Systems: Negative except as noted in the HPI. Denies N/V/F/Ch.  Past Medical History:  Diagnosis Date  . Diabetes mellitus without complication ([place])   . Diabetic gastroparesis ([place])   . Dialysis patient ([place])   . Hypertension   . Renal disorder    Dialysis  . Sepsis ([place])     Current Outpatient Medications:  .  calcitRIOL (ROCALTROL) 0.5 MCG capsule, Take 5 capsules (2.5 mcg total) by mouth every Monday, Wednesday, and Friday with hemodialysis., Disp: 30 capsule, Rfl: 0 .  ceFAZolin (ANCEF) 2-4 GM/100ML-% IVPB, Inject 100 mLs (2 g total) into the vein every Monday, Wednesday, and Friday with hemodialysis., Disp: 1 each, Rfl: 7 .  cinacalcet (SENSIPAR) 30 MG tablet, Take 30 mg by mouth daily with supper., Disp: , Rfl:  .  clonazePAM (KLONOPIN) 0.5 MG tablet, Take 0.5 mg by mouth daily as needed for anxiety (sleep). , Disp: , Rfl:  .  collagenase (SANTYL) ointment, Apply 1 application topically daily., Disp: 15 g, Rfl: 0 .  collagenase (SANTYL) ointment, Apply 1 application topically daily. Left 1st metatarsal amputation site measuring 3.0 x 3.0 x 0.5cm, Disp: 15 g, Rfl: 5 .  glucose blood ([employer]ER TEST STRIPS) test strip, by Other route 4 (four) times a day as needed., Disp: , Rfl:  .  HYDROcodone-acetaminophen (NORCO/VICODIN) 5-325 MG tablet, Take 1-2 tablets by mouth every 6 (six) hours as needed for moderate pain or severe pain., Disp: 20 tablet, Rfl: 0 .  insulin aspart (NOVOLOG) 100 UNIT/ML injection, Inject 6-7 Units into the skin 2 (two) times daily with a meal. , Disp: , Rfl:  .  insulin glargine (LANTUS) 100 UNIT/ML injection, Inject 0.15 mLs (15 Units total) into the skin at bedtime.,  Disp: 10 mL, Rfl: 1 .  multivitamin (RENA-VIT) TABS tablet, Take 1 tablet by mouth at bedtime., Disp: 30 tablet, Rfl: 0 .  ondansetron (ZOFRAN ODT) 4 MG disintegrating tablet, Take 1 tablet (4 mg total) by mouth every 8 (eight) hours as needed for nausea or vomiting., Disp: 10 tablet, Rfl: 0 .  sevelamer carbonate (RENVELA) 800 MG tablet, Take 2,400-3,200 mg by mouth See admin instructions. Take 3 - 4 tablets (2400 mg -3200 mg) by mouth two times daily with meals, Disp: , Rfl:  .  traMADol (ULTRAM) 50 MG tablet, , Disp: , Rfl:   Social History   Tobacco Use  Smoking Status Never Smoker  Smokeless Tobacco Never Used    No Known Allergies Objective:   There were no vitals filed for this visit. There is no height or weight on file to calculate BMI. Constitutional Well developed.  Vascular Left foot warm to touch  Neurologic Oriented to person, place, and time. Epicritic sensation to light touch grossly absent bilaterally.  Dermatologic Wound left 1st metatarsal area 0.5x1 with granular base  Left 3rd toe healed.  Bulla left lower extremity at the arch appears healed  No open wound right foot.  Orthopedic: Left hallux and fifth toe amputations noted.   Radiographs: None Assessment:   1. 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  S/p L I&D of Abscess, Hallux Amputation, with debridement and wound subsequent partial closure -Wound debrided as below  Procedure: Excisional Debridement of Wound Rationale: Removal of non-viable soft tissue from the wound to promote healing.  Anesthesia: none Pre-Debridement Wound Measurements: 1 cm x 0.5 cm x 0.1 cm  Post-Debridement Wound Measurements: 1.2 cm x 0.5 cm x 0.1 cm  Type of Debridement: Sharp Excisional Tissue Removed: Non-viable soft tissue Depth of Debridement: subcutaneous  tissue. Technique: Sharp excisional debridement to bleeding, viable wound base.  Dressing: Dry, sterile, compression dressing. Disposition: Patient tolerated procedure well. Patient to return in 1 week for follow-up.   Bulla left ankle -Healed well.   Return in about 3 weeks (around 03/16/2019), or wound care .

## 2019-02-24 DIAGNOSIS — E1065 Type 1 diabetes mellitus with hyperglycemia: Secondary | ICD-10-CM | POA: Diagnosis not present

## 2019-02-24 DIAGNOSIS — R739 Hyperglycemia, unspecified: Secondary | ICD-10-CM | POA: Diagnosis not present

## 2019-02-24 DIAGNOSIS — I120 Hypertensive chronic kidney disease with stage 5 chronic kidney disease or end stage renal disease: Secondary | ICD-10-CM | POA: Diagnosis not present

## 2019-02-24 DIAGNOSIS — E10621 Type 1 diabetes mellitus with foot ulcer: Secondary | ICD-10-CM | POA: Diagnosis not present

## 2019-02-24 DIAGNOSIS — L97424 Non-pressure chronic ulcer of left heel and midfoot with necrosis of bone: Secondary | ICD-10-CM | POA: Diagnosis not present

## 2019-02-24 DIAGNOSIS — N2581 Secondary hyperparathyroidism of renal origin: Secondary | ICD-10-CM | POA: Diagnosis not present

## 2019-02-24 DIAGNOSIS — N186 End stage renal disease: Secondary | ICD-10-CM | POA: Diagnosis not present

## 2019-02-24 DIAGNOSIS — T8142XA Infection following a procedure, deep incisional surgical site, initial encounter: Secondary | ICD-10-CM | POA: Diagnosis not present

## 2019-02-24 DIAGNOSIS — D509 Iron deficiency anemia, unspecified: Secondary | ICD-10-CM | POA: Diagnosis not present

## 2019-02-24 DIAGNOSIS — E1022 Type 1 diabetes mellitus with diabetic chronic kidney disease: Secondary | ICD-10-CM | POA: Diagnosis not present

## 2019-02-27 DIAGNOSIS — N186 End stage renal disease: Secondary | ICD-10-CM | POA: Diagnosis not present

## 2019-02-27 DIAGNOSIS — E1022 Type 1 diabetes mellitus with diabetic chronic kidney disease: Secondary | ICD-10-CM | POA: Diagnosis not present

## 2019-02-27 DIAGNOSIS — E10621 Type 1 diabetes mellitus with foot ulcer: Secondary | ICD-10-CM | POA: Diagnosis not present

## 2019-02-27 DIAGNOSIS — E1065 Type 1 diabetes mellitus with hyperglycemia: Secondary | ICD-10-CM | POA: Diagnosis not present

## 2019-02-27 DIAGNOSIS — M86672 Other chronic osteomyelitis, left ankle and foot: Secondary | ICD-10-CM | POA: Diagnosis not present

## 2019-02-27 DIAGNOSIS — N2581 Secondary hyperparathyroidism of renal origin: Secondary | ICD-10-CM | POA: Diagnosis not present

## 2019-02-27 DIAGNOSIS — L97424 Non-pressure chronic ulcer of left heel and midfoot with necrosis of bone: Secondary | ICD-10-CM | POA: Diagnosis not present

## 2019-02-27 DIAGNOSIS — R739 Hyperglycemia, unspecified: Secondary | ICD-10-CM | POA: Diagnosis not present

## 2019-02-27 DIAGNOSIS — I120 Hypertensive chronic kidney disease with stage 5 chronic kidney disease or end stage renal disease: Secondary | ICD-10-CM | POA: Diagnosis not present

## 2019-02-27 DIAGNOSIS — Z992 Dependence on renal dialysis: Secondary | ICD-10-CM | POA: Diagnosis not present

## 2019-02-27 DIAGNOSIS — T8142XA Infection following a procedure, deep incisional surgical site, initial encounter: Secondary | ICD-10-CM | POA: Diagnosis not present

## 2019-02-27 DIAGNOSIS — I129 Hypertensive chronic kidney disease with stage 1 through stage 4 chronic kidney disease, or unspecified chronic kidney disease: Secondary | ICD-10-CM | POA: Diagnosis not present

## 2019-02-27 DIAGNOSIS — D509 Iron deficiency anemia, unspecified: Secondary | ICD-10-CM | POA: Diagnosis not present

## 2019-03-01 DIAGNOSIS — N186 End stage renal disease: Secondary | ICD-10-CM | POA: Diagnosis not present

## 2019-03-01 DIAGNOSIS — N2581 Secondary hyperparathyroidism of renal origin: Secondary | ICD-10-CM | POA: Diagnosis not present

## 2019-03-01 DIAGNOSIS — M86672 Other chronic osteomyelitis, left ankle and foot: Secondary | ICD-10-CM | POA: Diagnosis not present

## 2019-03-01 DIAGNOSIS — E1022 Type 1 diabetes mellitus with diabetic chronic kidney disease: Secondary | ICD-10-CM | POA: Diagnosis not present

## 2019-03-01 DIAGNOSIS — R739 Hyperglycemia, unspecified: Secondary | ICD-10-CM | POA: Diagnosis not present

## 2019-03-01 DIAGNOSIS — D509 Iron deficiency anemia, unspecified: Secondary | ICD-10-CM | POA: Diagnosis not present

## 2019-03-02 DIAGNOSIS — E1065 Type 1 diabetes mellitus with hyperglycemia: Secondary | ICD-10-CM | POA: Diagnosis not present

## 2019-03-02 DIAGNOSIS — E1022 Type 1 diabetes mellitus with diabetic chronic kidney disease: Secondary | ICD-10-CM | POA: Diagnosis not present

## 2019-03-02 DIAGNOSIS — E10621 Type 1 diabetes mellitus with foot ulcer: Secondary | ICD-10-CM | POA: Diagnosis not present

## 2019-03-02 DIAGNOSIS — L97424 Non-pressure chronic ulcer of left heel and midfoot with necrosis of bone: Secondary | ICD-10-CM | POA: Diagnosis not present

## 2019-03-02 DIAGNOSIS — T8142XA Infection following a procedure, deep incisional surgical site, initial encounter: Secondary | ICD-10-CM | POA: Diagnosis not present

## 2019-03-02 DIAGNOSIS — I120 Hypertensive chronic kidney disease with stage 5 chronic kidney disease or end stage renal disease: Secondary | ICD-10-CM | POA: Diagnosis not present

## 2019-03-03 DIAGNOSIS — N186 End stage renal disease: Secondary | ICD-10-CM | POA: Diagnosis not present

## 2019-03-03 DIAGNOSIS — Z992 Dependence on renal dialysis: Secondary | ICD-10-CM | POA: Diagnosis not present

## 2019-03-03 DIAGNOSIS — I871 Compression of vein: Secondary | ICD-10-CM | POA: Diagnosis not present

## 2019-03-03 DIAGNOSIS — T82868A Thrombosis of vascular prosthetic devices, implants and grafts, initial encounter: Secondary | ICD-10-CM | POA: Diagnosis not present

## 2019-03-04 DIAGNOSIS — N186 End stage renal disease: Secondary | ICD-10-CM | POA: Diagnosis not present

## 2019-03-04 DIAGNOSIS — R739 Hyperglycemia, unspecified: Secondary | ICD-10-CM | POA: Diagnosis not present

## 2019-03-04 DIAGNOSIS — D509 Iron deficiency anemia, unspecified: Secondary | ICD-10-CM | POA: Diagnosis not present

## 2019-03-04 DIAGNOSIS — M86672 Other chronic osteomyelitis, left ankle and foot: Secondary | ICD-10-CM | POA: Diagnosis not present

## 2019-03-04 DIAGNOSIS — N2581 Secondary hyperparathyroidism of renal origin: Secondary | ICD-10-CM | POA: Diagnosis not present

## 2019-03-04 DIAGNOSIS — E1022 Type 1 diabetes mellitus with diabetic chronic kidney disease: Secondary | ICD-10-CM | POA: Diagnosis not present

## 2019-03-06 DIAGNOSIS — E10621 Type 1 diabetes mellitus with foot ulcer: Secondary | ICD-10-CM | POA: Diagnosis not present

## 2019-03-06 DIAGNOSIS — L97424 Non-pressure chronic ulcer of left heel and midfoot with necrosis of bone: Secondary | ICD-10-CM | POA: Diagnosis not present

## 2019-03-06 DIAGNOSIS — T8142XA Infection following a procedure, deep incisional surgical site, initial encounter: Secondary | ICD-10-CM | POA: Diagnosis not present

## 2019-03-06 DIAGNOSIS — M86672 Other chronic osteomyelitis, left ankle and foot: Secondary | ICD-10-CM | POA: Diagnosis not present

## 2019-03-06 DIAGNOSIS — R739 Hyperglycemia, unspecified: Secondary | ICD-10-CM | POA: Diagnosis not present

## 2019-03-06 DIAGNOSIS — D509 Iron deficiency anemia, unspecified: Secondary | ICD-10-CM | POA: Diagnosis not present

## 2019-03-06 DIAGNOSIS — N186 End stage renal disease: Secondary | ICD-10-CM | POA: Diagnosis not present

## 2019-03-06 DIAGNOSIS — I120 Hypertensive chronic kidney disease with stage 5 chronic kidney disease or end stage renal disease: Secondary | ICD-10-CM | POA: Diagnosis not present

## 2019-03-06 DIAGNOSIS — E1022 Type 1 diabetes mellitus with diabetic chronic kidney disease: Secondary | ICD-10-CM | POA: Diagnosis not present

## 2019-03-06 DIAGNOSIS — E1065 Type 1 diabetes mellitus with hyperglycemia: Secondary | ICD-10-CM | POA: Diagnosis not present

## 2019-03-06 DIAGNOSIS — N2581 Secondary hyperparathyroidism of renal origin: Secondary | ICD-10-CM | POA: Diagnosis not present

## 2019-03-08 ENCOUNTER — Other Ambulatory Visit: Payer: Self-pay

## 2019-03-08 ENCOUNTER — Emergency Department (HOSPITAL_COMMUNITY)
Admission: EM | Admit: 2019-03-08 | Discharge: 2019-03-08 | Disposition: A | Discharge status: Home / Self Care | Payer: Medicare Other | Attending: Emergency Medicine | Admitting: Emergency Medicine

## 2019-03-08 ENCOUNTER — Encounter (HOSPITAL_COMMUNITY): Payer: Self-pay

## 2019-03-08 DIAGNOSIS — N186 End stage renal disease: Secondary | ICD-10-CM | POA: Insufficient documentation

## 2019-03-08 DIAGNOSIS — M86672 Other chronic osteomyelitis, left ankle and foot: Secondary | ICD-10-CM | POA: Diagnosis not present

## 2019-03-08 DIAGNOSIS — I120 Hypertensive chronic kidney disease with stage 5 chronic kidney disease or end stage renal disease: Secondary | ICD-10-CM | POA: Insufficient documentation

## 2019-03-08 DIAGNOSIS — Z202 Contact with and (suspected) exposure to infections with a predominantly sexual mode of transmission: Secondary | ICD-10-CM | POA: Diagnosis not present

## 2019-03-08 DIAGNOSIS — N2581 Secondary hyperparathyroidism of renal origin: Secondary | ICD-10-CM | POA: Diagnosis not present

## 2019-03-08 DIAGNOSIS — N4889 Other specified disorders of penis: Secondary | ICD-10-CM | POA: Insufficient documentation

## 2019-03-08 DIAGNOSIS — E1022 Type 1 diabetes mellitus with diabetic chronic kidney disease: Secondary | ICD-10-CM | POA: Diagnosis not present

## 2019-03-08 DIAGNOSIS — N489 Disorder of penis, unspecified: Secondary | ICD-10-CM

## 2019-03-08 DIAGNOSIS — E109 Type 1 diabetes mellitus without complications: Secondary | ICD-10-CM | POA: Insufficient documentation

## 2019-03-08 DIAGNOSIS — D509 Iron deficiency anemia, unspecified: Secondary | ICD-10-CM | POA: Diagnosis not present

## 2019-03-08 DIAGNOSIS — Z992 Dependence on renal dialysis: Secondary | ICD-10-CM | POA: Insufficient documentation

## 2019-03-08 DIAGNOSIS — R739 Hyperglycemia, unspecified: Secondary | ICD-10-CM | POA: Diagnosis not present

## 2019-03-08 LAB — RAPID HIV SCREEN (HIV 1/2 AB+AG)
HIV 1/2 Antibodies: NONREACTIVE
HIV-1 P24 Antigen - HIV24: NONREACTIVE

## 2019-03-08 MED ORDER — CLINDAMYCIN HCL 150 MG PO CAPS: 450.0000 mg | ORAL_CAPSULE | Freq: Three times a day (TID) | ORAL | 0 refills | Status: AC

## 2019-03-08 NOTE — ED Provider Notes (Signed)
[facility] EMERGENCY DEPARTMENT Provider Note   CSN: [id number] Arrival date & time: 03/08/19  1246    History   Chief Complaint Chief Complaint  Patient presents with  . penis injury    HPI [name] is a 38 y.o. male with history of ESRD on dialysis MWF, last dialysis today, diabetic foot wounds, type 1 diabetes presents to the ER for evaluation of painful lesion on his penis.  Pain is described as burning.  He thinks that he accidentally cut his penis.  States 2 days ago he was trying to get a small box into his house without his family member seeing it so he shoved it down his pants.  He did not have immediate pain and did not realize he had hurt himself but a few hours later he noticed a burning discomfort.  He has been putting Neosporin ointment on it.  He had receptive oral sex 2 weeks ago but denies any other sex encounters.  He was not wearing a condom at that time.  Denies any fevers, chills, dysuria, hematuria, changes in urine output, penile discharge, testicular pain or swelling.  He still produces urine and has not noticed any changes in urine output.     HPI  Past Medical History:  Diagnosis Date  . Diabetes mellitus without complication ([place])   . Diabetic gastroparesis ([place])   . Dialysis patient ([place])   . Hypertension   . Renal disorder    Dialysis  . Sepsis [facility])     Patient Active Problem List   Diagnosis Date Noted  . Open wound of left foot   . Encounter for orthopedic aftercare following surgical amputation   . Foot infection   . Gangrene of toe of left foot ([place])   . Cellulitis 10/26/2018  . Diabetic ulcer of left midfoot associated with diabetes mellitus due to underlying condition, with bone involvement without evidence of necrosis ([place])   . Nausea vomiting and diarrhea 10/20/2018  . Septic arthritis of left foot ([place])   . DM (diabetes mellitus), secondary, uncontrolled, with neurologic complications ([place])   . Osteomyelitis of  foot, left, acute ([place])   . Acute osteomyelitis of left ankle or foot ([place])   . Osteomyelitis of fifth toe of left foot ([place])   . Anemia of chronic disease 08/18/2018  . Osteomyelitis ([place]) 02/01/2018  . Osteomyelitis of ankle or foot, acute, right ([place]) 10/30/2017  . Cellulitis and abscess of toe of left foot   . Diabetic foot infection ([place])   . Insulin dependent diabetes mellitus ([place]) 10/25/2017  . Diabetic gastroparesis ([place]) 10/25/2017  . Sepsis ([place]) 10/25/2017  . HTN (hypertension) 08/09/2014  . ESRD (end stage renal disease) on dialysis ([place]) 03/19/2014  . Bacteremia due to coagulase-negative Staphylococcus 05/31/2013  . Metabolic acidosis 47/42/5956    Past Surgical History:  Procedure Laterality Date  . AMPUTATION Right 02/02/2018   Procedure: RIGHT FIFTH TOE AND METATARSAL AMPUTATION. Filetted toe flap metatarsal resection. Debridement Plantar Foot wound;  Surgeon: Evelina Bucy, DPM;  Location: [facility];  Service: Podiatry;  Laterality: Right;  . AMPUTATION Left 08/20/2018   Procedure: FIFTH METATARSAL BONE BIOPSY;  Surgeon: Evelina Bucy, DPM;  Location: [place];  Service: Podiatry;  Laterality: Left;  . AMPUTATION Left 10/28/2018   Procedure: LEFT GREAT TOE AMPUTATION;  Surgeon: Evelina Bucy, DPM;  Location: [place] [facility];  Service: Podiatry;  Laterality: Left;  . APPLICATION OF WOUND VAC  02/02/2018   Procedure: APPLICATION OF  WOUND VAC  Right Foot;  Surgeon: Evelina Bucy, DPM;  Location: [facility];  Service: Podiatry;;  . APPLICATION OF WOUND VAC Left 10/28/2018   Procedure: APPLICATION OF WOUND VAC LEFT TOE;  Surgeon: Evelina Bucy, DPM;  Location: [facility];  Service: Podiatry;  Laterality: Left;  . APPLICATION OF WOUND VAC Left 11/01/2018   Procedure: APPLICATION OF WOUND VAC;  Surgeon: Evelina Bucy, DPM;  Location: [place] [facility];  Service: Podiatry;  Laterality: Left;  . AV FISTULA PLACEMENT     left arm.  . AV FISTULA PLACEMENT Right 12/22/2016   Procedure: INSERTION OF  ARTERIOVENOUS (AV) GORE-TEX GRAFT ARM;  Surgeon: Elam Dutch, MD;  Location[facility];  Service: Vascular;  Laterality: Right;  . AV FISTULA PLACEMENT Left 05/26/2018   Procedure: INSERTION OF  ARTERIOVENOUS (AV) GORE-TEX GRAFT LEFT ARM;  Surgeon: Serafina Mitchell, MD;  Location: [facility];  Service: Vascular;  Laterality: Left;  . EYE SURGERY    . I&D EXTREMITY Right 10/31/2017   Procedure: IRRIGATION AND DEBRIDEMENT RIGHT FOOT;  Surgeon: Evelina Bucy, DPM;  Location: [place];  Service: Podiatry;  Laterality: Right;  . I&D EXTREMITY Left 08/20/2018   Procedure: IRRIGATION AND DEBRIDEMENT EXTREMITY WITH SECONDARY WOUND CLOSUREAND APPLICATION OF WOUND VAC LEFT FOOT;  Surgeon: Evelina Bucy, DPM;  Location: [place];  Service: Podiatry;  Laterality: Left;  . I&D EXTREMITY Left 10/20/2018   Procedure: IRRIGATION AND DEBRIDEMENT LEFT FOOT  DEBRIDEMENT LATERAL FOOT WOUND;  Surgeon: Evelina Bucy, DPM;  Location: [place];  Service: Podiatry;  Laterality: Left;  . I&D EXTREMITY Left 10/28/2018   Procedure: IRRIGATION AND DEBRIDEMENT LEFT TOE;  Surgeon: Evelina Bucy, DPM;  Location: [place];  Service: Podiatry;  Laterality: Left;  . INSERTION OF DIALYSIS CATHETER     Right subclavian  . IR AV DIALY SHUNT INTRO NEEDLE/INTRACATH INITIAL W/PTA/IMG RIGHT Right 02/05/2018  . IR THROMBECTOMY AV FISTULA W/THROMBOLYSIS/PTA INC/SHUNT/IMG LEFT Left 08/24/2018  . IR THROMBECTOMY AV FISTULA W/THROMBOLYSIS/PTA INC/SHUNT/IMG LEFT Left 01/06/2019  . IR US GUIDE VASC ACCESS LEFT  08/24/2018  . IR US GUIDE VASC ACCESS RIGHT  02/05/2018  . IRRIGATION AND DEBRIDEMENT FOOT Right 10/23/2018   Procedure: Irrigation and Debridement to tendon, Left Foot;  Surgeon: Evelina Bucy, DPM;  Location: [place];  Service: Podiatry;  Laterality: Right;  . IRRIGATION AND DEBRIDEMENT FOOT Left 11/01/2018   Procedure: IRRIGATION AND DEBRIDEMENT PARTIAL WOUND CLOSURE LOCAL TISSUE TRANSFER AND FLAP ROTATION, LEFT FOOT;  Surgeon: Evelina Bucy,  DPM;  Location: [place];  Service: Podiatry;  Laterality: Left;  . TRANSMETATARSAL AMPUTATION N/A 08/18/2018   Procedure: IRRIGATION AND DEBRIDEMENT OF LEFT 5TH TOE AND TRANSMETATARSAL, WITH PARTICAL LEFT 5TH TOE AND METATARSAL AMPUTATION, BONE BIOPSY, WOUND VAC APPLICATION.;  Surgeon: Evelina Bucy, DPM;  Location: [facility];  Service: Podiatry;  Laterality: N/A;  . UPPER EXTREMITY VENOGRAPHY N/A 11/16/2016   Procedure: Upper Extremity Venography - Right Central;  Surgeon: Elam Dutch, MD;  Location: [place] CV LAB;  Service: Cardiovascular;  Laterality: N/A;  . UPPER EXTREMITY VENOGRAPHY N/A 05/25/2018   Procedure: UPPER EXTREMITY VENOGRAPHY - Bilateral;  Surgeon: Marty Heck, MD;  Location: [place] CV LAB;  Service: Cardiovascular;  Laterality: N/A;        Home Medications    Prior to Admission medications   Medication Sig Start Date End Date Taking? Authorizing Provider  calcitRIOL (ROCALTROL) 0.5 MCG capsule Take 5 capsules (2.5 mcg total) by mouth  every Monday, Wednesday, and Friday with hemodialysis. 10/26/18   Modena Jansky, MD  ceFAZolin (ANCEF) 2-4 GM/100ML-% IVPB Inject 100 mLs (2 g total) into the vein every Monday, Wednesday, and Friday with hemodialysis. 11/04/18   Florencia Reasons, MD  cinacalcet (SENSIPAR) 30 MG tablet Take 30 mg by mouth daily with supper.    [provider]  clindamycin (CLEOCIN) 150 MG capsule Take 3 capsules (450 mg total) by mouth 3 (three) times daily for 7 days. 03/08/19 03/15/19  Kinnie Feil, PA-C  clonazePAM (KLONOPIN) 0.5 MG tablet Take 0.5 mg by mouth daily as needed for anxiety (sleep).  07/21/17   [provider]  collagenase (SANTYL) ointment Apply 1 application topically daily. 08/23/18   Elgergawy, Silver Huguenin, MD  collagenase (SANTYL) ointment Apply 1 application topically daily. Left 1st metatarsal amputation site measuring 3.0 x 3.0 x 0.5cm 01/04/19   Price, Christian Mate, DPM  glucose blood ([employer] TEST STRIPS) test  strip by Other route 4 (four) times a day as needed. 10/29/16   [provider]  HYDROcodone-acetaminophen (NORCO/VICODIN) 5-325 MG tablet Take 1-2 tablets by mouth every 6 (six) hours as needed for moderate pain or severe pain. 11/10/18   Evelina Bucy, DPM  insulin aspart (NOVOLOG) 100 UNIT/ML injection Inject 6-7 Units into the skin 2 (two) times daily with a meal.     [provider]  insulin glargine (LANTUS) 100 UNIT/ML injection Inject 0.15 mLs (15 Units total) into the skin at bedtime. 08/23/18   Elgergawy, Silver Huguenin, MD  multivitamin (RENA-VIT) TABS tablet Take 1 tablet by mouth at bedtime. 10/24/18   Hongalgi, Lenis Dickinson, MD  ondansetron (ZOFRAN ODT) 4 MG disintegrating tablet Take 1 tablet (4 mg total) by mouth every 8 (eight) hours as needed for nausea or vomiting. 11/23/17   Ward, Ozella Almond, PA-C  sevelamer carbonate (RENVELA) 800 MG tablet Take 2,400-3,200 mg by mouth See admin instructions. Take 3 - 4 tablets (2400 mg -3200 mg) by mouth two times daily with meals    [provider]  traMADol Veatrice Bourbon) 50 MG tablet  01/11/19   [provider]    Family History Family History  Problem Relation Age of Onset  . Diabetes Mellitus II Other   . Diabetes Father   . Renal Disease Father        ESRD    Social History Social History   Tobacco Use  . Smoking status: Never Smoker  . Smokeless tobacco: Never Used  Substance Use Topics  . Alcohol use: No  . Drug use: No     Allergies   Patient has no known allergies.   Review of Systems Review of Systems  Genitourinary: Positive for penile pain.  Skin: Positive for wound.  All other systems reviewed and are negative.    Physical Exam Updated Vital Signs BP 104/75   Pulse 97   Temp 98.4 F (36.9 C) (Oral)   Resp 16   Ht 5\' 7"  (1.702 m)   Wt 79.4 kg   SpO2 100%   BMI 27.41 kg/m   Physical Exam Vitals signs and nursing note reviewed.  Constitutional:      General: He is not in  acute distress.    Appearance: He is well-developed.     Comments: NAD.  Found asleep but easily arousable.  HENT:     Head: Normocephalic and atraumatic.     Right Ear: External ear normal.     Left Ear: External ear normal.  Nose: Nose normal.  Eyes:     Conjunctiva/sclera: Conjunctivae normal.  Neck:     Musculoskeletal: Normal range of motion and neck supple.  Cardiovascular:     Rate and Rhythm: Normal rate and regular rhythm.     Heart sounds: Normal heart sounds.  Pulmonary:     Effort: Pulmonary effort is normal.     Breath sounds: Normal breath sounds.  Genitourinary:    Penis: Tenderness present.      Comments: Approximate 2 cm area of skin breakdown/erythema, exquisite tenderness.  This area appears moist.  No other lesions to genitalia, groin. Circumcised male.  No groin lymphadenopathy. No meatus discharge.  Scrotum without lesions or edema. Non tender testicles. Epididymis and spermatic cord without tenderness or masses, bilaterally. Musculoskeletal: Normal range of motion.        General: No deformity.  Skin:    General: Skin is warm and dry.     Capillary Refill: Capillary refill takes less than 2 seconds.  Neurological:     Mental Status: He is alert and oriented to person, place, and time.  Psychiatric:        Behavior: Behavior normal.        Thought Content: Thought content normal.        Judgment: Judgment normal.      ED Treatments / Results  Labs (all labs ordered are listed, but only abnormal results are displayed) Labs Reviewed  RAPID HIV SCREEN (HIV 1/2 AB+AG)  RPR    EKG None  Radiology No results found.  Procedures Procedures (including critical care time)  Medications Ordered in ED Medications - No data to display   Initial Impression / Assessment and Plan / ED Course  I have reviewed the triage vital signs and the nursing notes.  Pertinent labs & imaging results that were available during my care of the patient were  reviewed by me and considered in my medical decision making (see chart for details).        38 year old is here with painful penile lesion.  On exam lesion is erythematous, exquisitely tender.  Does not look classically of a laceration or traumatic wound but given recent trauma this is a possibility.  Recent unprotected sexual encounter.  I recommended swabs for gonorrhea, chlamydia, trichomoniasis and urinalysis but patient adamantly declined this and stated that he could not tolerate the swab and that he could not provide a urine because he had just been dialyzed.  I offered to await the urine in the ER but he states he was too tired to wait and just wanted to be discharged.  RPR and HIV test pending.  I have low suspicion for syphilis given the pain associated with the wound.  Given location of the pain, multiple comorbidities and risk for complication, will discharge with antibiotic ointment and clindamycin to prevent infection in the groin.  Recommended follow-up for wound check in 2 to 3 days.  Return precautions were given.  Patient is comfortable with this plan.  Final Clinical Impressions(s) / ED Diagnoses   Final diagnoses:  Lesion of penis    ED Discharge Orders         Ordered    clindamycin (CLEOCIN) 150 MG capsule  3 times daily     03/08/19 1425           Kinnie Feil, [place] 03/08/19 1449    Daleen Bo, MD 03/10/19 2207

## 2019-03-08 NOTE — ED Triage Notes (Addendum)
Pt POV d/t penial injury. Pt states x2 days ago he was moving a box and it cut his penis. Pt states he's been applying neosporin to area. Pt HD MWF, last HD today

## 2019-03-08 NOTE — Discharge Instructions (Addendum)
You were seen in the ER for painful lesion on your penis.  You declined swab test for gonorrhea and chlamydia.  You declined providing a urine here and were unable to do so.  Because of this we have not tested you for gonorrhea, chlamydia or trichomonas.  We have obtained blood work to rule out syphilis lesion.  These results are pending and usually take several days to come back.  You will be notified if these are positive via phone call.  Lesion on your penis may be related to trauma or a cut or skin breakdown from moisture in that area.  Given the location of the area and risk for infection we will treat you with antibiotics.  Take oral antibiotic as prescribed.  Continue applying a thin layer of Neosporin or other antibiotic ointment at least twice daily.  Follow up with your general doctor for re-evaluation in 3 days to ensure symptoms are improving. You may be able to give urine sample for tests we weren't able to do today.  Return to the ER for worsening pain, fever, redness, swelling, warmth, discharge

## 2019-03-09 ENCOUNTER — Ambulatory Visit: Payer: Medicare Other | Admitting: Podiatry

## 2019-03-09 LAB — RPR: RPR Ser Ql: NONREACTIVE

## 2019-03-10 DIAGNOSIS — R739 Hyperglycemia, unspecified: Secondary | ICD-10-CM | POA: Diagnosis not present

## 2019-03-10 DIAGNOSIS — N2581 Secondary hyperparathyroidism of renal origin: Secondary | ICD-10-CM | POA: Diagnosis not present

## 2019-03-10 DIAGNOSIS — M86672 Other chronic osteomyelitis, left ankle and foot: Secondary | ICD-10-CM | POA: Diagnosis not present

## 2019-03-10 DIAGNOSIS — E1022 Type 1 diabetes mellitus with diabetic chronic kidney disease: Secondary | ICD-10-CM | POA: Diagnosis not present

## 2019-03-10 DIAGNOSIS — N186 End stage renal disease: Secondary | ICD-10-CM | POA: Diagnosis not present

## 2019-03-10 DIAGNOSIS — D509 Iron deficiency anemia, unspecified: Secondary | ICD-10-CM | POA: Diagnosis not present

## 2019-03-13 DIAGNOSIS — M86672 Other chronic osteomyelitis, left ankle and foot: Secondary | ICD-10-CM | POA: Diagnosis not present

## 2019-03-13 DIAGNOSIS — E1022 Type 1 diabetes mellitus with diabetic chronic kidney disease: Secondary | ICD-10-CM | POA: Diagnosis not present

## 2019-03-13 DIAGNOSIS — N2581 Secondary hyperparathyroidism of renal origin: Secondary | ICD-10-CM | POA: Diagnosis not present

## 2019-03-13 DIAGNOSIS — N186 End stage renal disease: Secondary | ICD-10-CM | POA: Diagnosis not present

## 2019-03-13 DIAGNOSIS — R739 Hyperglycemia, unspecified: Secondary | ICD-10-CM | POA: Diagnosis not present

## 2019-03-13 DIAGNOSIS — D509 Iron deficiency anemia, unspecified: Secondary | ICD-10-CM | POA: Diagnosis not present

## 2019-03-15 DIAGNOSIS — M86672 Other chronic osteomyelitis, left ankle and foot: Secondary | ICD-10-CM | POA: Diagnosis not present

## 2019-03-15 DIAGNOSIS — N2581 Secondary hyperparathyroidism of renal origin: Secondary | ICD-10-CM | POA: Diagnosis not present

## 2019-03-15 DIAGNOSIS — R739 Hyperglycemia, unspecified: Secondary | ICD-10-CM | POA: Diagnosis not present

## 2019-03-15 DIAGNOSIS — D509 Iron deficiency anemia, unspecified: Secondary | ICD-10-CM | POA: Diagnosis not present

## 2019-03-15 DIAGNOSIS — E1022 Type 1 diabetes mellitus with diabetic chronic kidney disease: Secondary | ICD-10-CM | POA: Diagnosis not present

## 2019-03-15 DIAGNOSIS — N186 End stage renal disease: Secondary | ICD-10-CM | POA: Diagnosis not present

## 2019-03-16 ENCOUNTER — Telehealth: Payer: Self-pay | Admitting: *Deleted

## 2019-03-16 ENCOUNTER — Other Ambulatory Visit: Payer: Self-pay

## 2019-03-16 ENCOUNTER — Ambulatory Visit (INDEPENDENT_AMBULATORY_CARE_PROVIDER_SITE_OTHER): Payer: Medicare Other | Admitting: Podiatry

## 2019-03-16 ENCOUNTER — Ambulatory Visit (INDEPENDENT_AMBULATORY_CARE_PROVIDER_SITE_OTHER): Payer: Medicare Other

## 2019-03-16 ENCOUNTER — Other Ambulatory Visit: Payer: Self-pay | Admitting: Podiatry

## 2019-03-16 DIAGNOSIS — E10621 Type 1 diabetes mellitus with foot ulcer: Secondary | ICD-10-CM

## 2019-03-16 DIAGNOSIS — L97424 Non-pressure chronic ulcer of left heel and midfoot with necrosis of bone: Secondary | ICD-10-CM | POA: Diagnosis not present

## 2019-03-16 DIAGNOSIS — M79672 Pain in left foot: Secondary | ICD-10-CM

## 2019-03-16 DIAGNOSIS — E08621 Diabetes mellitus due to underlying condition with foot ulcer: Secondary | ICD-10-CM

## 2019-03-16 DIAGNOSIS — Z91199 Patient's noncompliance with other medical treatment and regimen due to unspecified reason: Secondary | ICD-10-CM

## 2019-03-16 DIAGNOSIS — L97423 Non-pressure chronic ulcer of left heel and midfoot with necrosis of muscle: Secondary | ICD-10-CM

## 2019-03-16 DIAGNOSIS — M86672 Other chronic osteomyelitis, left ankle and foot: Secondary | ICD-10-CM

## 2019-03-16 DIAGNOSIS — Z9119 Patient's noncompliance with other medical treatment and regimen: Secondary | ICD-10-CM

## 2019-03-16 MED ORDER — CEFAZOLIN SODIUM-DEXTROSE 2-4 GM/100ML-% IV SOLN: 2.0000 g | INTRAVENOUS | 7 refills | Status: DC

## 2019-03-16 NOTE — Progress Notes (Signed)
Subjective:  Patient ID: [name], male    DOB: 1981/07/29,  MRN: [id number]  Chief Complaint  Patient presents with  . Routine Post Op    Pt only seen by Dr. March Rummage.   38 y.o. male presents for wound care follow-up. Doing well denies complaints. Thinks his foot is a little swollen. Not wearing his surgical shoes as directed.  Review of Systems: Negative except as noted in the HPI. Denies N/V/F/Ch.  Past Medical History:  Diagnosis Date  . Diabetes mellitus without complication ([place])   . Diabetic gastroparesis ([place])   . Dialysis patient ([place])   . Hypertension   . Renal disorder    Dialysis  . Sepsis ([place])     Current Outpatient Medications:  .  calcitRIOL (ROCALTROL) 0.5 MCG capsule, Take 5 capsules (2.5 mcg total) by mouth every Monday, Wednesday, and Friday with hemodialysis., Disp: 30 capsule, Rfl: 0 .  ceFAZolin (ANCEF) 2-4 GM/100ML-% IVPB, Inject 100 mLs (2 g total) into the vein every Monday, Wednesday, and Friday with hemodialysis., Disp: 1 each, Rfl: 7 .  cinacalcet (SENSIPAR) 30 MG tablet, Take 30 mg by mouth daily with supper., Disp: , Rfl:  .  clonazePAM (KLONOPIN) 0.5 MG tablet, Take 0.5 mg by mouth daily as needed for anxiety (sleep). , Disp: , Rfl:  .  collagenase (SANTYL) ointment, Apply 1 application topically daily., Disp: 15 g, Rfl: 0 .  collagenase (SANTYL) ointment, Apply 1 application topically daily. Left 1st metatarsal amputation site measuring 3.0 x 3.0 x 0.5cm, Disp: 15 g, Rfl: 5 .  glucose blood ([employer] TEST STRIPS) test strip, by Other route 4 (four) times a day as needed., Disp: , Rfl:  .  HYDROcodone-acetaminophen (NORCO/VICODIN) 5-325 MG tablet, Take 1-2 tablets by mouth every 6 (six) hours as needed for moderate pain or severe pain., Disp: 20 tablet, Rfl: 0 .  insulin aspart (NOVOLOG) 100 UNIT/ML injection, Inject 6-7 Units into the skin 2 (two) times daily with a meal. , Disp: , Rfl:  .  insulin glargine (LANTUS) 100 UNIT/ML injection, Inject  0.15 mLs (15 Units total) into the skin at bedtime., Disp: 10 mL, Rfl: 1 .  multivitamin (RENA-VIT) TABS tablet, Take 1 tablet by mouth at bedtime., Disp: 30 tablet, Rfl: 0 .  ondansetron (ZOFRAN ODT) 4 MG disintegrating tablet, Take 1 tablet (4 mg total) by mouth every 8 (eight) hours as needed for nausea or vomiting., Disp: 10 tablet, Rfl: 0 .  sevelamer carbonate (RENVELA) 800 MG tablet, Take 2,400-3,200 mg by mouth See admin instructions. Take 3 - 4 tablets (2400 mg -3200 mg) by mouth two times daily with meals, Disp: , Rfl:  .  traMADol (ULTRAM) 50 MG tablet, , Disp: , Rfl:   Social History   Tobacco Use  Smoking Status Never Smoker  Smokeless Tobacco Never Used    No Known Allergies Objective:   There were no vitals filed for this visit. There is no height or weight on file to calculate BMI. Constitutional Well developed.  Vascular Left foot warm to touch  Neurologic Oriented to person, place, and time. Epicritic sensation to light touch grossly absent bilaterally.  Dermatologic Wound left 1st metatarsal area fully healed no open ulcerations noted. Residual edema noted left forefoot.    Orthopedic: Left hallux and fifth toe amputations noted.   Radiographs: Taken and reviewed. Erosive changes noted about hte lateral 1st metatarsal and 2nd toe with possible pathologic fracture.  Assessment:   1. Diabetic ulcer of left midfoot associated  with diabetes mellitus due to underlying condition, with necrosis of muscle ([place])   2. Left foot pain   3. Chronic osteomyelitis involving left ankle and foot ([place])   4. Non-compliance    Plan:  Patient was evaluated and treated and all questions answered.  S/p L I&D of Abscess, Hallux Amputation, with debridement and wound subsequent partial closure -Wound appears healed. Some swelling noted today. -XR taken and reviewed. There does appear to be erosive changes since March. The wound has since healed. The infection is likely chronic. Given  his wound took so long to heal and he has tenuous blood supply, I do not think bone resection is advisable at this time unless urgently indicated. Will resume antibiotics and refer to ID. Will trial previous course of antibiotics - was previously on IV ancef with HD. Will monitor for signs of reduction of edema. Advised should any signs worsen to call promptly for appt pt verbalized understanding. -Discussed importance of wearing surgical shoes until DM shoe fabrication. Not wearing them today.  DM with amputation Hx -I discussed he would benefit from DM shoes, toe filler left. He is reluctant to wear them due to appearance. I discussed that they are medically necessary. I did discuss that if he is refusing to wear them I would ask that he at least wear them at home, and if he wanted to wear other shoes that he preferred to purchase shoes that were larger to accomodate his inserts and to wear these inserts with his shoes. I stressed that while this is not my full medical recommendation, and my full recommendation will be sole wearing of the shoes and his inserts, I was trying to work with him to at least insure some use rather than having him not wear them at all. Patient verbalized understanding.    No follow-ups on file.

## 2019-03-16 NOTE — Telephone Encounter (Signed)
Dr. Jimmy Footman - [facility] states they no longer see pt in office, pt is seen at [facility] on [address].

## 2019-03-16 NOTE — Telephone Encounter (Signed)
[facility] states pt was discontinued from [facility] because he is healed, and our office does not need to send discharge orders, and we will probably receive discharge orders.

## 2019-03-16 NOTE — Telephone Encounter (Signed)
[facility] states pt was discharged from their care 03/06/2019. I asked who discharged pt and was transferred to [facility], who stated she would have his [place] call to discuss, but it looks like the pt may have discharged.

## 2019-03-16 NOTE — Telephone Encounter (Signed)
Faxed required form, clinicals and demographics to Infectious Disease.

## 2019-03-16 NOTE — Telephone Encounter (Signed)
Dr. March Rummage states discontinue [place] for pt, reordered Ancef to be delivered by IV through hemodialysis Monday, Wednesday and Friday with Dr. Detterding, and would like pt referred to Infectious Disease.

## 2019-03-16 NOTE — Telephone Encounter (Signed)
[facility] states fax [phone] orders. Dr. March Rummage states make certain pt is to begin in dialysis tomorrow, for 8 doses over 3 weeks. Faxed orders for Ancef to [facility].

## 2019-03-17 DIAGNOSIS — R739 Hyperglycemia, unspecified: Secondary | ICD-10-CM | POA: Diagnosis not present

## 2019-03-17 DIAGNOSIS — E1022 Type 1 diabetes mellitus with diabetic chronic kidney disease: Secondary | ICD-10-CM | POA: Diagnosis not present

## 2019-03-17 DIAGNOSIS — N2581 Secondary hyperparathyroidism of renal origin: Secondary | ICD-10-CM | POA: Diagnosis not present

## 2019-03-17 DIAGNOSIS — D509 Iron deficiency anemia, unspecified: Secondary | ICD-10-CM | POA: Diagnosis not present

## 2019-03-17 DIAGNOSIS — N186 End stage renal disease: Secondary | ICD-10-CM | POA: Diagnosis not present

## 2019-03-17 DIAGNOSIS — M86672 Other chronic osteomyelitis, left ankle and foot: Secondary | ICD-10-CM | POA: Diagnosis not present

## 2019-03-20 DIAGNOSIS — R739 Hyperglycemia, unspecified: Secondary | ICD-10-CM | POA: Diagnosis not present

## 2019-03-20 DIAGNOSIS — N2581 Secondary hyperparathyroidism of renal origin: Secondary | ICD-10-CM | POA: Diagnosis not present

## 2019-03-20 DIAGNOSIS — I871 Compression of vein: Secondary | ICD-10-CM | POA: Diagnosis not present

## 2019-03-20 DIAGNOSIS — T82868A Thrombosis of vascular prosthetic devices, implants and grafts, initial encounter: Secondary | ICD-10-CM | POA: Diagnosis not present

## 2019-03-20 DIAGNOSIS — D509 Iron deficiency anemia, unspecified: Secondary | ICD-10-CM | POA: Diagnosis not present

## 2019-03-20 DIAGNOSIS — Z992 Dependence on renal dialysis: Secondary | ICD-10-CM | POA: Diagnosis not present

## 2019-03-20 DIAGNOSIS — E1022 Type 1 diabetes mellitus with diabetic chronic kidney disease: Secondary | ICD-10-CM | POA: Diagnosis not present

## 2019-03-20 DIAGNOSIS — N186 End stage renal disease: Secondary | ICD-10-CM | POA: Diagnosis not present

## 2019-03-20 DIAGNOSIS — M86672 Other chronic osteomyelitis, left ankle and foot: Secondary | ICD-10-CM | POA: Diagnosis not present

## 2019-03-22 DIAGNOSIS — N2581 Secondary hyperparathyroidism of renal origin: Secondary | ICD-10-CM | POA: Diagnosis not present

## 2019-03-22 DIAGNOSIS — E1022 Type 1 diabetes mellitus with diabetic chronic kidney disease: Secondary | ICD-10-CM | POA: Diagnosis not present

## 2019-03-22 DIAGNOSIS — D509 Iron deficiency anemia, unspecified: Secondary | ICD-10-CM | POA: Diagnosis not present

## 2019-03-22 DIAGNOSIS — N186 End stage renal disease: Secondary | ICD-10-CM | POA: Diagnosis not present

## 2019-03-22 DIAGNOSIS — M86672 Other chronic osteomyelitis, left ankle and foot: Secondary | ICD-10-CM | POA: Diagnosis not present

## 2019-03-22 DIAGNOSIS — R739 Hyperglycemia, unspecified: Secondary | ICD-10-CM | POA: Diagnosis not present

## 2019-03-24 DIAGNOSIS — N2581 Secondary hyperparathyroidism of renal origin: Secondary | ICD-10-CM | POA: Diagnosis not present

## 2019-03-24 DIAGNOSIS — N186 End stage renal disease: Secondary | ICD-10-CM | POA: Diagnosis not present

## 2019-03-24 DIAGNOSIS — D509 Iron deficiency anemia, unspecified: Secondary | ICD-10-CM | POA: Diagnosis not present

## 2019-03-24 DIAGNOSIS — E1022 Type 1 diabetes mellitus with diabetic chronic kidney disease: Secondary | ICD-10-CM | POA: Diagnosis not present

## 2019-03-24 DIAGNOSIS — M86672 Other chronic osteomyelitis, left ankle and foot: Secondary | ICD-10-CM | POA: Diagnosis not present

## 2019-03-24 DIAGNOSIS — R739 Hyperglycemia, unspecified: Secondary | ICD-10-CM | POA: Diagnosis not present

## 2019-03-27 DIAGNOSIS — N2581 Secondary hyperparathyroidism of renal origin: Secondary | ICD-10-CM | POA: Diagnosis not present

## 2019-03-27 DIAGNOSIS — M86672 Other chronic osteomyelitis, left ankle and foot: Secondary | ICD-10-CM | POA: Diagnosis not present

## 2019-03-27 DIAGNOSIS — D509 Iron deficiency anemia, unspecified: Secondary | ICD-10-CM | POA: Diagnosis not present

## 2019-03-27 DIAGNOSIS — N186 End stage renal disease: Secondary | ICD-10-CM | POA: Diagnosis not present

## 2019-03-27 DIAGNOSIS — E1022 Type 1 diabetes mellitus with diabetic chronic kidney disease: Secondary | ICD-10-CM | POA: Diagnosis not present

## 2019-03-27 DIAGNOSIS — R739 Hyperglycemia, unspecified: Secondary | ICD-10-CM | POA: Diagnosis not present

## 2019-03-29 DIAGNOSIS — N186 End stage renal disease: Secondary | ICD-10-CM | POA: Diagnosis not present

## 2019-03-29 DIAGNOSIS — I129 Hypertensive chronic kidney disease with stage 1 through stage 4 chronic kidney disease, or unspecified chronic kidney disease: Secondary | ICD-10-CM | POA: Diagnosis not present

## 2019-03-29 DIAGNOSIS — Z992 Dependence on renal dialysis: Secondary | ICD-10-CM | POA: Diagnosis not present

## 2019-03-29 DIAGNOSIS — A4101 Sepsis due to Methicillin susceptible Staphylococcus aureus: Secondary | ICD-10-CM | POA: Diagnosis not present

## 2019-03-29 DIAGNOSIS — D509 Iron deficiency anemia, unspecified: Secondary | ICD-10-CM | POA: Diagnosis not present

## 2019-03-29 DIAGNOSIS — E1022 Type 1 diabetes mellitus with diabetic chronic kidney disease: Secondary | ICD-10-CM | POA: Diagnosis not present

## 2019-03-29 DIAGNOSIS — N2581 Secondary hyperparathyroidism of renal origin: Secondary | ICD-10-CM | POA: Diagnosis not present

## 2019-03-30 ENCOUNTER — Ambulatory Visit (INDEPENDENT_AMBULATORY_CARE_PROVIDER_SITE_OTHER): Payer: Medicare Other | Admitting: Podiatry

## 2019-03-30 DIAGNOSIS — Z5329 Procedure and treatment not carried out because of patient's decision for other reasons: Secondary | ICD-10-CM

## 2019-03-30 NOTE — Progress Notes (Signed)
   Complete physical exam  Patient: [name]   DOB: 07/18/2001   38 y.o. Male  MRN: [id number]  Subjective:    No chief complaint on file.   [name] is a 38 y.o. male who presents today for a complete physical exam. She reports consuming a {diet types:17450} diet. {types:19826} She generally feels {DESC; WELL/FAIRLY WELL/POORLY:18703}. She reports sleeping {DESC; WELL/FAIRLY WELL/POORLY:18703}. She {does/does not:200015} have additional problems to discuss today.    Most recent fall risk assessment:    03/25/2022   10:42 AM  Fall Risk   Falls in the past year? 0  Number falls in past yr: 0  Injury with Fall? 0  Risk for fall due to : No Fall Risks  Follow up Falls evaluation completed     Most recent depression screenings:    03/25/2022   10:42 AM 02/13/2021   10:46 AM  PHQ 2/9 Scores  PHQ - 2 Score 0 0  PHQ- 9 Score 5     {VISON DENTAL STD PSA (Optional):27386}  {History (Optional):23778}  Patient Care Team: Jessup, Joy, NP as PCP - General (Nurse Practitioner)   Outpatient Medications Prior to Visit  Medication Sig   fluticasone (FLONASE) 50 MCG/ACT nasal spray Place 2 sprays into both nostrils in the morning and at bedtime. After 7 days, reduce to once daily.   norgestimate-ethinyl estradiol (SPRINTEC 28) 0.25-35 MG-MCG tablet Take 1 tablet by mouth daily.   Nystatin POWD Apply liberally to affected area 2 times per day   spironolactone (ALDACTONE) 100 MG tablet Take 1 tablet (100 mg total) by mouth daily.   No facility-administered medications prior to visit.    ROS        Objective:     There were no vitals taken for this visit. {Vitals History (Optional):23777}  Physical Exam   No results found for any visits on 04/30/22. {Show previous labs (optional):23779}    Assessment & Plan:    Routine Health Maintenance and Physical Exam  Immunization History  Administered Date(s) Administered   DTaP 10/01/1999, 11/27/1999,  02/05/2000, 10/21/2000, 05/06/2004   Hepatitis A 03/02/2008, 03/08/2009   Hepatitis B 07/19/1999, 08/26/1999, 02/05/2000   HiB (PRP-OMP) 10/01/1999, 11/27/1999, 02/05/2000, 10/21/2000   IPV 10/01/1999, 11/27/1999, 07/26/2000, 05/06/2004   Influenza,inj,Quad PF,6+ Mos 06/08/2014   Influenza-Unspecified 09/07/2012   MMR 07/26/2001, 05/06/2004   Meningococcal Polysaccharide 03/07/2012   Pneumococcal Conjugate-13 10/21/2000   Pneumococcal-Unspecified 02/05/2000, 04/20/2000   Tdap 03/07/2012   Varicella 07/26/2000, 03/02/2008    Health Maintenance  Topic Date Due   HIV Screening  Never done   Hepatitis C Screening  Never done   INFLUENZA VACCINE  04/28/2022   PAP-Cervical Cytology Screening  04/30/2022 (Originally 07/17/2020)   PAP SMEAR-Modifier  04/30/2022 (Originally 07/17/2020)   TETANUS/TDAP  04/30/2022 (Originally 03/07/2022)   HPV VACCINES  Discontinued   COVID-19 Vaccine  Discontinued    Discussed health benefits of physical activity, and encouraged her to engage in regular exercise appropriate for her age and condition.  Problem List Items Addressed This Visit   None Visit Diagnoses     Annual physical exam    -  Primary   Cervical cancer screening       Need for Tdap vaccination          No follow-ups on file.     Joy Jessup, NP   

## 2019-03-31 DIAGNOSIS — D509 Iron deficiency anemia, unspecified: Secondary | ICD-10-CM | POA: Diagnosis not present

## 2019-03-31 DIAGNOSIS — N2581 Secondary hyperparathyroidism of renal origin: Secondary | ICD-10-CM | POA: Diagnosis not present

## 2019-03-31 DIAGNOSIS — N186 End stage renal disease: Secondary | ICD-10-CM | POA: Diagnosis not present

## 2019-03-31 DIAGNOSIS — A4101 Sepsis due to Methicillin susceptible Staphylococcus aureus: Secondary | ICD-10-CM | POA: Diagnosis not present

## 2019-03-31 DIAGNOSIS — E1022 Type 1 diabetes mellitus with diabetic chronic kidney disease: Secondary | ICD-10-CM | POA: Diagnosis not present

## 2019-04-03 DIAGNOSIS — E1022 Type 1 diabetes mellitus with diabetic chronic kidney disease: Secondary | ICD-10-CM | POA: Diagnosis not present

## 2019-04-03 DIAGNOSIS — N186 End stage renal disease: Secondary | ICD-10-CM | POA: Diagnosis not present

## 2019-04-03 DIAGNOSIS — N2581 Secondary hyperparathyroidism of renal origin: Secondary | ICD-10-CM | POA: Diagnosis not present

## 2019-04-03 DIAGNOSIS — D509 Iron deficiency anemia, unspecified: Secondary | ICD-10-CM | POA: Diagnosis not present

## 2019-04-03 DIAGNOSIS — A4101 Sepsis due to Methicillin susceptible Staphylococcus aureus: Secondary | ICD-10-CM | POA: Diagnosis not present

## 2019-04-05 DIAGNOSIS — A4101 Sepsis due to Methicillin susceptible Staphylococcus aureus: Secondary | ICD-10-CM | POA: Diagnosis not present

## 2019-04-05 DIAGNOSIS — E1022 Type 1 diabetes mellitus with diabetic chronic kidney disease: Secondary | ICD-10-CM | POA: Diagnosis not present

## 2019-04-05 DIAGNOSIS — N186 End stage renal disease: Secondary | ICD-10-CM | POA: Diagnosis not present

## 2019-04-05 DIAGNOSIS — E1129 Type 2 diabetes mellitus with other diabetic kidney complication: Secondary | ICD-10-CM | POA: Diagnosis not present

## 2019-04-05 DIAGNOSIS — D509 Iron deficiency anemia, unspecified: Secondary | ICD-10-CM | POA: Diagnosis not present

## 2019-04-05 DIAGNOSIS — N2581 Secondary hyperparathyroidism of renal origin: Secondary | ICD-10-CM | POA: Diagnosis not present

## 2019-04-06 ENCOUNTER — Ambulatory Visit (INDEPENDENT_AMBULATORY_CARE_PROVIDER_SITE_OTHER): Payer: Medicare Other | Admitting: Podiatry

## 2019-04-06 ENCOUNTER — Other Ambulatory Visit: Payer: Self-pay

## 2019-04-06 DIAGNOSIS — Z5329 Procedure and treatment not carried out because of patient's decision for other reasons: Secondary | ICD-10-CM

## 2019-04-06 NOTE — Progress Notes (Signed)
Patient checked in, then left without being seen. Told front office staff he could not wait any longer and would rather be seen next week. This is the second week in a row he has not been seen - last week he no showed, today LWBS. I am concerned about worsening if he is not compliant with is appointments.

## 2019-04-07 DIAGNOSIS — N2581 Secondary hyperparathyroidism of renal origin: Secondary | ICD-10-CM | POA: Diagnosis not present

## 2019-04-07 DIAGNOSIS — A4101 Sepsis due to Methicillin susceptible Staphylococcus aureus: Secondary | ICD-10-CM | POA: Diagnosis not present

## 2019-04-07 DIAGNOSIS — E1022 Type 1 diabetes mellitus with diabetic chronic kidney disease: Secondary | ICD-10-CM | POA: Diagnosis not present

## 2019-04-07 DIAGNOSIS — D509 Iron deficiency anemia, unspecified: Secondary | ICD-10-CM | POA: Diagnosis not present

## 2019-04-07 DIAGNOSIS — N186 End stage renal disease: Secondary | ICD-10-CM | POA: Diagnosis not present

## 2019-04-10 DIAGNOSIS — D509 Iron deficiency anemia, unspecified: Secondary | ICD-10-CM | POA: Diagnosis not present

## 2019-04-10 DIAGNOSIS — N186 End stage renal disease: Secondary | ICD-10-CM | POA: Diagnosis not present

## 2019-04-10 DIAGNOSIS — A4101 Sepsis due to Methicillin susceptible Staphylococcus aureus: Secondary | ICD-10-CM | POA: Diagnosis not present

## 2019-04-10 DIAGNOSIS — E1022 Type 1 diabetes mellitus with diabetic chronic kidney disease: Secondary | ICD-10-CM | POA: Diagnosis not present

## 2019-04-10 DIAGNOSIS — N2581 Secondary hyperparathyroidism of renal origin: Secondary | ICD-10-CM | POA: Diagnosis not present

## 2019-04-12 DIAGNOSIS — A4101 Sepsis due to Methicillin susceptible Staphylococcus aureus: Secondary | ICD-10-CM | POA: Diagnosis not present

## 2019-04-12 DIAGNOSIS — N186 End stage renal disease: Secondary | ICD-10-CM | POA: Diagnosis not present

## 2019-04-12 DIAGNOSIS — D509 Iron deficiency anemia, unspecified: Secondary | ICD-10-CM | POA: Diagnosis not present

## 2019-04-12 DIAGNOSIS — E1022 Type 1 diabetes mellitus with diabetic chronic kidney disease: Secondary | ICD-10-CM | POA: Diagnosis not present

## 2019-04-12 DIAGNOSIS — N2581 Secondary hyperparathyroidism of renal origin: Secondary | ICD-10-CM | POA: Diagnosis not present

## 2019-04-13 ENCOUNTER — Ambulatory Visit: Payer: Medicare Other | Admitting: Podiatry

## 2019-04-13 ENCOUNTER — Ambulatory Visit: Payer: Medicare Other | Admitting: Infectious Disease

## 2019-04-14 ENCOUNTER — Ambulatory Visit (INDEPENDENT_AMBULATORY_CARE_PROVIDER_SITE_OTHER): Payer: Medicare Other | Admitting: Podiatry

## 2019-04-14 ENCOUNTER — Other Ambulatory Visit: Payer: Self-pay

## 2019-04-14 DIAGNOSIS — L97423 Non-pressure chronic ulcer of left heel and midfoot with necrosis of muscle: Secondary | ICD-10-CM | POA: Diagnosis not present

## 2019-04-14 DIAGNOSIS — A4101 Sepsis due to Methicillin susceptible Staphylococcus aureus: Secondary | ICD-10-CM | POA: Diagnosis not present

## 2019-04-14 DIAGNOSIS — E08621 Diabetes mellitus due to underlying condition with foot ulcer: Secondary | ICD-10-CM

## 2019-04-14 DIAGNOSIS — D509 Iron deficiency anemia, unspecified: Secondary | ICD-10-CM | POA: Diagnosis not present

## 2019-04-14 DIAGNOSIS — N186 End stage renal disease: Secondary | ICD-10-CM | POA: Diagnosis not present

## 2019-04-14 DIAGNOSIS — E1022 Type 1 diabetes mellitus with diabetic chronic kidney disease: Secondary | ICD-10-CM | POA: Diagnosis not present

## 2019-04-14 DIAGNOSIS — N2581 Secondary hyperparathyroidism of renal origin: Secondary | ICD-10-CM | POA: Diagnosis not present

## 2019-04-14 MED ORDER — SILVER SULFADIAZINE 1 % EX CREA: TOPICAL_CREAM | CUTANEOUS | 0 refills | Status: DC

## 2019-04-17 DIAGNOSIS — N2581 Secondary hyperparathyroidism of renal origin: Secondary | ICD-10-CM | POA: Diagnosis not present

## 2019-04-17 DIAGNOSIS — E1022 Type 1 diabetes mellitus with diabetic chronic kidney disease: Secondary | ICD-10-CM | POA: Diagnosis not present

## 2019-04-17 DIAGNOSIS — A4101 Sepsis due to Methicillin susceptible Staphylococcus aureus: Secondary | ICD-10-CM | POA: Diagnosis not present

## 2019-04-17 DIAGNOSIS — D509 Iron deficiency anemia, unspecified: Secondary | ICD-10-CM | POA: Diagnosis not present

## 2019-04-17 DIAGNOSIS — N186 End stage renal disease: Secondary | ICD-10-CM | POA: Diagnosis not present

## 2019-04-19 DIAGNOSIS — A4101 Sepsis due to Methicillin susceptible Staphylococcus aureus: Secondary | ICD-10-CM | POA: Diagnosis not present

## 2019-04-19 DIAGNOSIS — N2581 Secondary hyperparathyroidism of renal origin: Secondary | ICD-10-CM | POA: Diagnosis not present

## 2019-04-19 DIAGNOSIS — D509 Iron deficiency anemia, unspecified: Secondary | ICD-10-CM | POA: Diagnosis not present

## 2019-04-19 DIAGNOSIS — N186 End stage renal disease: Secondary | ICD-10-CM | POA: Diagnosis not present

## 2019-04-19 DIAGNOSIS — E1022 Type 1 diabetes mellitus with diabetic chronic kidney disease: Secondary | ICD-10-CM | POA: Diagnosis not present

## 2019-04-21 DIAGNOSIS — N2581 Secondary hyperparathyroidism of renal origin: Secondary | ICD-10-CM | POA: Diagnosis not present

## 2019-04-21 DIAGNOSIS — E1022 Type 1 diabetes mellitus with diabetic chronic kidney disease: Secondary | ICD-10-CM | POA: Diagnosis not present

## 2019-04-21 DIAGNOSIS — D509 Iron deficiency anemia, unspecified: Secondary | ICD-10-CM | POA: Diagnosis not present

## 2019-04-21 DIAGNOSIS — A4101 Sepsis due to Methicillin susceptible Staphylococcus aureus: Secondary | ICD-10-CM | POA: Diagnosis not present

## 2019-04-21 DIAGNOSIS — N186 End stage renal disease: Secondary | ICD-10-CM | POA: Diagnosis not present

## 2019-04-24 ENCOUNTER — Emergency Department (HOSPITAL_COMMUNITY): Payer: Medicare Other

## 2019-04-24 ENCOUNTER — Inpatient Hospital Stay (HOSPITAL_COMMUNITY): Payer: Medicare Other

## 2019-04-24 ENCOUNTER — Encounter (HOSPITAL_COMMUNITY): Payer: Self-pay

## 2019-04-24 ENCOUNTER — Telehealth: Payer: Self-pay | Admitting: Podiatry

## 2019-04-24 ENCOUNTER — Inpatient Hospital Stay (HOSPITAL_COMMUNITY)
Admission: EM | Admit: 2019-04-24 | Discharge: 2019-04-27 | DRG: 637 | Disposition: A | Discharge status: Home / Self Care | Payer: Medicare Other | Source: Other Acute Inpatient Hospital | Attending: Internal Medicine | Admitting: Internal Medicine

## 2019-04-24 ENCOUNTER — Other Ambulatory Visit: Payer: Self-pay

## 2019-04-24 DIAGNOSIS — Z89412 Acquired absence of left great toe: Secondary | ICD-10-CM

## 2019-04-24 DIAGNOSIS — L02612 Cutaneous abscess of left foot: Secondary | ICD-10-CM

## 2019-04-24 DIAGNOSIS — T8744 Infection of amputation stump, left lower extremity: Secondary | ICD-10-CM | POA: Diagnosis not present

## 2019-04-24 DIAGNOSIS — R197 Diarrhea, unspecified: Secondary | ICD-10-CM | POA: Diagnosis present

## 2019-04-24 DIAGNOSIS — I700 Atherosclerosis of aorta: Secondary | ICD-10-CM | POA: Diagnosis present

## 2019-04-24 DIAGNOSIS — E861 Hypovolemia: Secondary | ICD-10-CM | POA: Diagnosis present

## 2019-04-24 DIAGNOSIS — E109 Type 1 diabetes mellitus without complications: Secondary | ICD-10-CM

## 2019-04-24 DIAGNOSIS — L97424 Non-pressure chronic ulcer of left heel and midfoot with necrosis of bone: Secondary | ICD-10-CM | POA: Diagnosis not present

## 2019-04-24 DIAGNOSIS — K3184 Gastroparesis: Secondary | ICD-10-CM | POA: Diagnosis present

## 2019-04-24 DIAGNOSIS — Z8249 Family history of ischemic heart disease and other diseases of the circulatory system: Secondary | ICD-10-CM

## 2019-04-24 DIAGNOSIS — E08621 Diabetes mellitus due to underlying condition with foot ulcer: Secondary | ICD-10-CM | POA: Diagnosis not present

## 2019-04-24 DIAGNOSIS — R410 Disorientation, unspecified: Secondary | ICD-10-CM | POA: Diagnosis not present

## 2019-04-24 DIAGNOSIS — E8889 Other specified metabolic disorders: Secondary | ICD-10-CM | POA: Diagnosis present

## 2019-04-24 DIAGNOSIS — Z89422 Acquired absence of other left toe(s): Secondary | ICD-10-CM

## 2019-04-24 DIAGNOSIS — L03032 Cellulitis of left toe: Secondary | ICD-10-CM

## 2019-04-24 DIAGNOSIS — L97529 Non-pressure chronic ulcer of other part of left foot with unspecified severity: Secondary | ICD-10-CM | POA: Diagnosis present

## 2019-04-24 DIAGNOSIS — Z992 Dependence on renal dialysis: Secondary | ICD-10-CM | POA: Diagnosis not present

## 2019-04-24 DIAGNOSIS — E1022 Type 1 diabetes mellitus with diabetic chronic kidney disease: Secondary | ICD-10-CM | POA: Diagnosis present

## 2019-04-24 DIAGNOSIS — Z20828 Contact with and (suspected) exposure to other viral communicable diseases: Secondary | ICD-10-CM | POA: Diagnosis present

## 2019-04-24 DIAGNOSIS — D631 Anemia in chronic kidney disease: Secondary | ICD-10-CM | POA: Diagnosis not present

## 2019-04-24 DIAGNOSIS — I38 Endocarditis, valve unspecified: Secondary | ICD-10-CM | POA: Diagnosis present

## 2019-04-24 DIAGNOSIS — Z79899 Other long term (current) drug therapy: Secondary | ICD-10-CM | POA: Diagnosis not present

## 2019-04-24 DIAGNOSIS — E1143 Type 2 diabetes mellitus with diabetic autonomic (poly)neuropathy: Secondary | ICD-10-CM | POA: Diagnosis not present

## 2019-04-24 DIAGNOSIS — R7881 Bacteremia: Secondary | ICD-10-CM | POA: Diagnosis not present

## 2019-04-24 DIAGNOSIS — R112 Nausea with vomiting, unspecified: Secondary | ICD-10-CM | POA: Diagnosis not present

## 2019-04-24 DIAGNOSIS — E11621 Type 2 diabetes mellitus with foot ulcer: Secondary | ICD-10-CM

## 2019-04-24 DIAGNOSIS — L97429 Non-pressure chronic ulcer of left heel and midfoot with unspecified severity: Secondary | ICD-10-CM | POA: Diagnosis present

## 2019-04-24 DIAGNOSIS — B952 Enterococcus as the cause of diseases classified elsewhere: Secondary | ICD-10-CM | POA: Diagnosis not present

## 2019-04-24 DIAGNOSIS — E871 Hypo-osmolality and hyponatremia: Secondary | ICD-10-CM | POA: Diagnosis not present

## 2019-04-24 DIAGNOSIS — Z7982 Long term (current) use of aspirin: Secondary | ICD-10-CM | POA: Diagnosis not present

## 2019-04-24 DIAGNOSIS — Z9119 Patient's noncompliance with other medical treatment and regimen: Secondary | ICD-10-CM

## 2019-04-24 DIAGNOSIS — I959 Hypotension, unspecified: Secondary | ICD-10-CM | POA: Diagnosis not present

## 2019-04-24 DIAGNOSIS — E10621 Type 1 diabetes mellitus with foot ulcer: Secondary | ICD-10-CM | POA: Diagnosis not present

## 2019-04-24 DIAGNOSIS — E1043 Type 1 diabetes mellitus with diabetic autonomic (poly)neuropathy: Secondary | ICD-10-CM | POA: Diagnosis not present

## 2019-04-24 DIAGNOSIS — M868X7 Other osteomyelitis, ankle and foot: Secondary | ICD-10-CM | POA: Diagnosis present

## 2019-04-24 DIAGNOSIS — G9340 Encephalopathy, unspecified: Secondary | ICD-10-CM | POA: Diagnosis not present

## 2019-04-24 DIAGNOSIS — Z794 Long term (current) use of insulin: Secondary | ICD-10-CM | POA: Diagnosis not present

## 2019-04-24 DIAGNOSIS — B9561 Methicillin susceptible Staphylococcus aureus infection as the cause of diseases classified elsewhere: Secondary | ICD-10-CM | POA: Diagnosis present

## 2019-04-24 DIAGNOSIS — N2581 Secondary hyperparathyroidism of renal origin: Secondary | ICD-10-CM | POA: Diagnosis present

## 2019-04-24 DIAGNOSIS — M8580 Other specified disorders of bone density and structure, unspecified site: Secondary | ICD-10-CM | POA: Diagnosis present

## 2019-04-24 DIAGNOSIS — G9341 Metabolic encephalopathy: Secondary | ICD-10-CM | POA: Diagnosis present

## 2019-04-24 DIAGNOSIS — Z89421 Acquired absence of other right toe(s): Secondary | ICD-10-CM

## 2019-04-24 DIAGNOSIS — R404 Transient alteration of awareness: Secondary | ICD-10-CM | POA: Diagnosis not present

## 2019-04-24 DIAGNOSIS — Z5329 Procedure and treatment not carried out because of patient's decision for other reasons: Secondary | ICD-10-CM | POA: Diagnosis not present

## 2019-04-24 DIAGNOSIS — L570 Actinic keratosis: Secondary | ICD-10-CM | POA: Diagnosis present

## 2019-04-24 DIAGNOSIS — D509 Iron deficiency anemia, unspecified: Secondary | ICD-10-CM | POA: Diagnosis not present

## 2019-04-24 DIAGNOSIS — I120 Hypertensive chronic kidney disease with stage 5 chronic kidney disease or end stage renal disease: Secondary | ICD-10-CM | POA: Diagnosis not present

## 2019-04-24 DIAGNOSIS — R109 Unspecified abdominal pain: Secondary | ICD-10-CM | POA: Diagnosis not present

## 2019-04-24 DIAGNOSIS — R159 Full incontinence of feces: Secondary | ICD-10-CM | POA: Diagnosis not present

## 2019-04-24 DIAGNOSIS — E1069 Type 1 diabetes mellitus with other specified complication: Secondary | ICD-10-CM | POA: Diagnosis not present

## 2019-04-24 DIAGNOSIS — D751 Secondary polycythemia: Secondary | ICD-10-CM | POA: Diagnosis present

## 2019-04-24 DIAGNOSIS — N186 End stage renal disease: Secondary | ICD-10-CM | POA: Diagnosis not present

## 2019-04-24 DIAGNOSIS — Z841 Family history of disorders of kidney and ureter: Secondary | ICD-10-CM

## 2019-04-24 DIAGNOSIS — I708 Atherosclerosis of other arteries: Secondary | ICD-10-CM | POA: Diagnosis present

## 2019-04-24 DIAGNOSIS — E118 Type 2 diabetes mellitus with unspecified complications: Secondary | ICD-10-CM

## 2019-04-24 DIAGNOSIS — M86172 Other acute osteomyelitis, left ankle and foot: Secondary | ICD-10-CM | POA: Diagnosis not present

## 2019-04-24 DIAGNOSIS — E1169 Type 2 diabetes mellitus with other specified complication: Secondary | ICD-10-CM | POA: Diagnosis not present

## 2019-04-24 DIAGNOSIS — Z833 Family history of diabetes mellitus: Secondary | ICD-10-CM | POA: Diagnosis not present

## 2019-04-24 DIAGNOSIS — A4101 Sepsis due to Methicillin susceptible Staphylococcus aureus: Secondary | ICD-10-CM | POA: Diagnosis not present

## 2019-04-24 DIAGNOSIS — E108 Type 1 diabetes mellitus with unspecified complications: Secondary | ICD-10-CM

## 2019-04-24 DIAGNOSIS — E1122 Type 2 diabetes mellitus with diabetic chronic kidney disease: Secondary | ICD-10-CM | POA: Diagnosis not present

## 2019-04-24 LAB — GLUCOSE, CAPILLARY: Glucose-Capillary: 208 mg/dL — ABNORMAL HIGH (ref 70–99)

## 2019-04-24 LAB — PROTIME-INR
INR: 1.2 (ref 0.8–1.2)
Prothrombin Time: 15.4 seconds — ABNORMAL HIGH (ref 11.4–15.2)

## 2019-04-24 LAB — POCT I-STAT EG7
Acid-Base Excess: 2 mmol/L (ref 0.0–2.0)
Bicarbonate: 24.7 mmol/L (ref 20.0–28.0)
Calcium, Ion: 0.99 mmol/L — ABNORMAL LOW (ref 1.15–1.40)
HCT: 56 % — ABNORMAL HIGH (ref 39.0–52.0)
Hemoglobin: 19 g/dL — ABNORMAL HIGH (ref 13.0–17.0)
O2 Saturation: 63 %
Potassium: 4.1 mmol/L (ref 3.5–5.1)
Sodium: 128 mmol/L — ABNORMAL LOW (ref 135–145)
TCO2: 26 mmol/L (ref 22–32)
pCO2, Ven: 32 mmHg — ABNORMAL LOW (ref 44.0–60.0)
pH, Ven: 7.495 — ABNORMAL HIGH (ref 7.250–7.430)
pO2, Ven: 30 mmHg — CL (ref 32.0–45.0)

## 2019-04-24 LAB — CBG MONITORING, ED
Glucose-Capillary: 126 mg/dL — ABNORMAL HIGH (ref 70–99)
Glucose-Capillary: 337 mg/dL — ABNORMAL HIGH (ref 70–99)

## 2019-04-24 LAB — COMPREHENSIVE METABOLIC PANEL
ALT: 13 U/L (ref 0–44)
AST: 19 U/L (ref 15–41)
Albumin: 3.3 g/dL — ABNORMAL LOW (ref 3.5–5.0)
Alkaline Phosphatase: 182 U/L — ABNORMAL HIGH (ref 38–126)
Anion gap: 22 — ABNORMAL HIGH (ref 5–15)
BUN: 60 mg/dL — ABNORMAL HIGH (ref 6–20)
CO2: 21 mmol/L — ABNORMAL LOW (ref 22–32)
Calcium: 9.5 mg/dL (ref 8.9–10.3)
Chloride: 86 mmol/L — ABNORMAL LOW (ref 98–111)
Creatinine, Ser: 13.86 mg/dL — ABNORMAL HIGH (ref 0.61–1.24)
GFR calc Af Amer: 5 mL/min — ABNORMAL LOW (ref >60)
GFR calc non Af Amer: 4 mL/min — ABNORMAL LOW (ref >60)
Glucose, Bld: 131 mg/dL — ABNORMAL HIGH (ref 70–99)
Potassium: 4.2 mmol/L (ref 3.5–5.1)
Sodium: 129 mmol/L — ABNORMAL LOW (ref 135–145)
Total Bilirubin: 1.8 mg/dL — ABNORMAL HIGH (ref 0.3–1.2)
Total Protein: 7.7 g/dL (ref 6.5–8.1)

## 2019-04-24 LAB — CBC
HCT: 52.1 % — ABNORMAL HIGH (ref 39.0–52.0)
Hemoglobin: 17.5 g/dL — ABNORMAL HIGH (ref 13.0–17.0)
MCH: 30.8 pg (ref 26.0–34.0)
MCHC: 33.6 g/dL (ref 30.0–36.0)
MCV: 91.6 fL (ref 80.0–100.0)
Platelets: 135 10*3/uL — ABNORMAL LOW (ref 150–400)
RBC: 5.69 MIL/uL (ref 4.22–5.81)
RDW: 12.7 % (ref 11.5–15.5)
WBC: 12.7 10*3/uL — ABNORMAL HIGH (ref 4.0–10.5)
nRBC: 0 % (ref 0.0–0.2)

## 2019-04-24 LAB — APTT: aPTT: 40 seconds — ABNORMAL HIGH (ref 24–36)

## 2019-04-24 LAB — C DIFFICILE QUICK SCREEN W PCR REFLEX
C Diff antigen: POSITIVE — AB
C Diff toxin: NEGATIVE

## 2019-04-24 LAB — LACTIC ACID, PLASMA: Lactic Acid, Venous: 1.3 mmol/L (ref 0.5–1.9)

## 2019-04-24 LAB — CLOSTRIDIUM DIFFICILE BY PCR, REFLEXED: Toxigenic C. Difficile by PCR: NEGATIVE

## 2019-04-24 LAB — MAGNESIUM: Magnesium: 3 mg/dL — ABNORMAL HIGH (ref 1.7–2.4)

## 2019-04-24 LAB — C-REACTIVE PROTEIN: CRP: 18.8 mg/dL — ABNORMAL HIGH (ref <1.0)

## 2019-04-24 LAB — SEDIMENTATION RATE: Sed Rate: 4 mm/hr (ref 0–16)

## 2019-04-24 LAB — SARS CORONAVIRUS 2 BY RT PCR (HOSPITAL ORDER, PERFORMED IN ~~LOC~~ HOSPITAL LAB): SARS Coronavirus 2: NEGATIVE

## 2019-04-24 MED ORDER — ALUM & MAG HYDROXIDE-SIMETH 200-200-20 MG/5ML PO SUSP
30.0000 mL | Freq: Once | ORAL | Status: DC
  Filled 2019-04-24: qty 30

## 2019-04-24 MED ORDER — LIDOCAINE VISCOUS HCL 2 % MT SOLN
15.0000 mL | Freq: Once | OROMUCOSAL | Status: DC
  Filled 2019-04-24: qty 15

## 2019-04-24 MED ORDER — SODIUM CHLORIDE 0.9 % IV SOLN
2.0000 g | Freq: Once | INTRAVENOUS | Status: AC
  Administered 2019-04-24: 2 g via INTRAVENOUS
  Filled 2019-04-24: qty 20

## 2019-04-24 MED ORDER — RENA-VITE PO TABS
1.0000 | ORAL_TABLET | Freq: Every day | ORAL | Status: DC
  Administered 2019-04-24 – 2019-04-26 (×3): 1 via ORAL
  Filled 2019-04-24 (×3): qty 1

## 2019-04-24 MED ORDER — INSULIN ASPART 100 UNIT/ML ~~LOC~~ SOLN
0.0000 [IU] | Freq: Every day | SUBCUTANEOUS | Status: DC
  Administered 2019-04-24: 2 [IU] via SUBCUTANEOUS

## 2019-04-24 MED ORDER — VANCOMYCIN HCL IN DEXTROSE 1-5 GM/200ML-% IV SOLN: 1000.0000 mg | Freq: Once | INTRAVENOUS | Status: DC

## 2019-04-24 MED ORDER — CHLORHEXIDINE GLUCONATE CLOTH 2 % EX PADS: 6.0000 | MEDICATED_PAD | Freq: Every day | CUTANEOUS | Status: DC

## 2019-04-24 MED ORDER — DEXTROSE 5 % IV SOLN
5.0000 mg/kg | Freq: Once | INTRAVENOUS | Status: AC
  Administered 2019-04-24: 375 mg via INTRAVENOUS
  Filled 2019-04-24: qty 7.5

## 2019-04-24 MED ORDER — DEXTROSE 5 % IV SOLN
10.0000 mg/kg | Freq: Once | INTRAVENOUS | Status: DC
  Filled 2019-04-24: qty 15

## 2019-04-24 MED ORDER — FENTANYL CITRATE (PF) 100 MCG/2ML IJ SOLN
50.0000 ug | Freq: Once | INTRAMUSCULAR | Status: AC
  Administered 2019-04-24: 50 ug via INTRAVENOUS
  Filled 2019-04-24: qty 2

## 2019-04-24 MED ORDER — VANCOMYCIN HCL 10 G IV SOLR
1750.0000 mg | Freq: Once | INTRAVENOUS | Status: AC
  Administered 2019-04-24: 1750 mg via INTRAVENOUS
  Filled 2019-04-24: qty 1750

## 2019-04-24 MED ORDER — IOHEXOL 300 MG/ML  SOLN
75.0000 mL | Freq: Once | INTRAMUSCULAR | Status: AC | PRN
  Administered 2019-04-24: 75 mL via INTRAVENOUS

## 2019-04-24 MED ORDER — FENTANYL CITRATE (PF) 100 MCG/2ML IJ SOLN
25.0000 ug | Freq: Once | INTRAMUSCULAR | Status: AC
  Administered 2019-04-24: 25 ug via INTRAVENOUS
  Filled 2019-04-24: qty 2

## 2019-04-24 MED ORDER — METOCLOPRAMIDE HCL 5 MG/ML IJ SOLN
5.0000 mg | Freq: Two times a day (BID) | INTRAMUSCULAR | Status: DC
  Administered 2019-04-24 – 2019-04-27 (×6): 5 mg via INTRAVENOUS
  Filled 2019-04-24 (×6): qty 2

## 2019-04-24 MED ORDER — SENNOSIDES-DOCUSATE SODIUM 8.6-50 MG PO TABS: 1.0000 | ORAL_TABLET | Freq: Every evening | ORAL | Status: DC | PRN

## 2019-04-24 MED ORDER — HYDROMORPHONE HCL 1 MG/ML IJ SOLN
0.5000 mg | Freq: Once | INTRAMUSCULAR | Status: AC
  Administered 2019-04-24: 0.5 mg via INTRAVENOUS
  Filled 2019-04-24: qty 1

## 2019-04-24 MED ORDER — SODIUM CHLORIDE 0.9 % IV BOLUS
1000.0000 mL | Freq: Once | INTRAVENOUS | Status: AC
  Administered 2019-04-24: 1000 mL via INTRAVENOUS

## 2019-04-24 MED ORDER — VANCOMYCIN VARIABLE DOSE PER UNSTABLE RENAL FUNCTION (PHARMACIST DOSING): Status: DC

## 2019-04-24 MED ORDER — PANTOPRAZOLE SODIUM 40 MG IV SOLR
40.0000 mg | INTRAVENOUS | Status: DC
  Administered 2019-04-25 – 2019-04-26 (×2): 40 mg via INTRAVENOUS
  Filled 2019-04-24 (×2): qty 40

## 2019-04-24 MED ORDER — INSULIN ASPART 100 UNIT/ML ~~LOC~~ SOLN: 0.0000 [IU] | Freq: Once | SUBCUTANEOUS | Status: DC

## 2019-04-24 MED ORDER — DEXTROSE 5 % IV SOLN
5.0000 mg/kg | INTRAVENOUS | Status: DC
  Filled 2019-04-24: qty 7.5

## 2019-04-24 MED ORDER — HEPARIN SODIUM (PORCINE) 5000 UNIT/ML IJ SOLN
5000.0000 [IU] | Freq: Three times a day (TID) | INTRAMUSCULAR | Status: DC
  Filled 2019-04-24 (×3): qty 1

## 2019-04-24 MED ORDER — FENTANYL CITRATE (PF) 100 MCG/2ML IJ SOLN: 50.0000 ug | Freq: Once | INTRAMUSCULAR | Status: DC

## 2019-04-24 MED ORDER — FENTANYL 25 MCG/HR TD PT72
1.0000 | MEDICATED_PATCH | TRANSDERMAL | Status: DC
  Filled 2019-04-24: qty 1

## 2019-04-24 MED ORDER — ASPIRIN 325 MG PO TABS
325.0000 mg | ORAL_TABLET | Freq: Every day | ORAL | Status: DC
  Administered 2019-04-25 – 2019-04-26 (×2): 325 mg via ORAL
  Filled 2019-04-24 (×3): qty 1

## 2019-04-24 MED ORDER — CINACALCET HCL 30 MG PO TABS
60.0000 mg | ORAL_TABLET | Freq: Every day | ORAL | Status: DC
  Administered 2019-04-25 – 2019-04-26 (×2): 60 mg via ORAL
  Filled 2019-04-24 (×2): qty 2

## 2019-04-24 MED ORDER — LOSARTAN POTASSIUM 50 MG PO TABS
50.0000 mg | ORAL_TABLET | Freq: Every day | ORAL | Status: DC
  Filled 2019-04-24: qty 1

## 2019-04-24 MED ORDER — INSULIN GLARGINE 100 UNIT/ML ~~LOC~~ SOLN
5.0000 [IU] | Freq: Every day | SUBCUTANEOUS | Status: DC
  Administered 2019-04-24: 5 [IU] via SUBCUTANEOUS
  Filled 2019-04-24 (×2): qty 0.05

## 2019-04-24 MED ORDER — CALCITRIOL 0.25 MCG PO CAPS
0.2500 ug | ORAL_CAPSULE | ORAL | Status: DC
  Administered 2019-04-25: 0.25 ug via ORAL
  Filled 2019-04-24: qty 1

## 2019-04-24 MED ORDER — SODIUM CHLORIDE 0.9% FLUSH: 3.0000 mL | Freq: Once | INTRAVENOUS | Status: DC

## 2019-04-24 MED ORDER — PROMETHAZINE HCL 25 MG PO TABS
12.5000 mg | ORAL_TABLET | Freq: Four times a day (QID) | ORAL | Status: DC | PRN
  Administered 2019-04-24: 12.5 mg via ORAL
  Filled 2019-04-24 (×2): qty 1

## 2019-04-24 MED ORDER — SEVELAMER CARBONATE 800 MG PO TABS
1600.0000 mg | ORAL_TABLET | Freq: Three times a day (TID) | ORAL | Status: DC
  Administered 2019-04-25 – 2019-04-27 (×6): 1600 mg via ORAL
  Filled 2019-04-24 (×7): qty 2

## 2019-04-24 MED ORDER — ACETAMINOPHEN 650 MG RE SUPP: 650.0000 mg | Freq: Four times a day (QID) | RECTAL | Status: DC | PRN

## 2019-04-24 MED ORDER — ACETAMINOPHEN 325 MG PO TABS: 650.0000 mg | ORAL_TABLET | Freq: Four times a day (QID) | ORAL | Status: DC | PRN

## 2019-04-24 MED ORDER — INSULIN ASPART 100 UNIT/ML ~~LOC~~ SOLN
0.0000 [IU] | Freq: Three times a day (TID) | SUBCUTANEOUS | Status: DC
  Administered 2019-04-25: 11 [IU] via SUBCUTANEOUS
  Administered 2019-04-25: 3 [IU] via SUBCUTANEOUS
  Administered 2019-04-25: 10 [IU] via SUBCUTANEOUS
  Administered 2019-04-26: 8 [IU] via SUBCUTANEOUS
  Administered 2019-04-27: 5 [IU] via SUBCUTANEOUS

## 2019-04-24 MED ORDER — RAMELTEON 8 MG PO TABS
8.0000 mg | ORAL_TABLET | Freq: Every day | ORAL | Status: DC
  Administered 2019-04-25 – 2019-04-26 (×3): 8 mg via ORAL
  Filled 2019-04-24 (×4): qty 1

## 2019-04-24 NOTE — H&P (Addendum)
Date: 04/24/2019               Patient Name:  [name] MRN: [id number]  DOB: May 22, 1981 Age / Sex: 38 y.o., male   PCP: Elgergawy, Silver Huguenin, MD         Medical Service: Internal Medicine Teaching Service         Attending Physician: Dr. Rebeca Alert, Raynaldo Opitz, MD    First Contact: Dr. Sheppard Coil  Pager: [phone]  Second Contact: Dr. Trilby Drummer Pager: [phone]       After Hours (After 5p/  First Contact Pager: [phone]  weekends / holidays): Second Contact Pager: [phone]   Chief Complaint: Nausea, vomiting, abdominal pain, diarrhea  History of Present Illness:  [name] is a 38 y.o male with ESRD on HD MWF, HTN, Insulin-dependent diabetes mellitus, diabetic gastroparesis, and diabetic foot ulcer who presents with nausea, vomiting, confusion, diarrhea, and decreased appetite for the past 3 days.  In the dialysis session the patient was noted to be disoriented and have some confusion mentioning that he wanted to see the ball game.   Spoke to the patient's mother who mentioned that the patient does not have any confusion or altered mentation at baseline. She had last seen him 3 days ago and at that time he was doing well. The patient patient had apparently called his mother yesterday stating that he was not feeling well and having increased diarrhea and that he would go to the ED if he continued to not feel well.   In the ED, the patient was febrile to 100.2, tachycardic 90-100, normal respirations 10- 20, normotensive 100-130/60-70s, respirating on room air and was altered stating that he wanted to go to the ballgame. He was started on vancomycin, ceftriaxone, acyclovir.  He was also given fentanyl 50 mcg and a 2 L bolus of normal saline.  The patient was more interactive with this provider. He was alert and oriented. He mentioned that yesterday he tried to eat chicken noodle soup, but brought it all back up. He has apparently been vomiting about two times daily over the past few days.  He has accompanied generalized abdominal pain. He also informed that he has been having nocturnal fecal incontinence for 2-3 years now requiring him to wear diapers daily. Patient was evaluated by Dr. Carlean Purl [facility]) in February 2018 during which time the patient was told to stop sodas, take a low FODMAP diet, and felt that colonoscopy and ano-rectal manometry maybe needed in the future. The patient states that he was told to take bowel prep for colonoscopy as an outpatient, but he was worried that it would cause volume overload given that he has ESRD.   Meds:  Current Meds  Medication Sig  . Amino Acids-Protein Hydrolys (FEEDING SUPPLEMENT, PRO-STAT SUGAR FREE 64,) LIQD Take 30 mLs by mouth 2 (two) times a day.  Marland Kitchen aspirin 325 MG tablet Take 325 mg by mouth at bedtime.  . cinacalcet (SENSIPAR) 30 MG tablet Take 30 mg by mouth daily with supper.  . collagenase (SANTYL) ointment Apply 1 application topically daily.  . insulin aspart (NOVOLOG) 100 UNIT/ML injection Inject 6-7 Units into the skin 2 (two) times daily with a meal.   . insulin glargine (LANTUS) 100 UNIT/ML injection Inject 0.15 mLs (15 Units total) into the skin at bedtime.  . lactobacillus acidophilus & bulgar (LACTINEX) chewable tablet Chew 1 tablet by mouth 3 (three) times daily with meals.  Marland Kitchen losartan (COZAAR) 50 MG tablet Take  50 mg by mouth daily.  . metoCLOPramide (REGLAN) 5 MG tablet Take 5 mg by mouth 3 (three) times daily before meals.   . multivitamin (RENA-VIT) TABS tablet Take 1 tablet by mouth at bedtime.  . promethazine (PHENERGAN) 25 MG tablet Take 25 mg by mouth every 6 (six) hours as needed for nausea or vomiting.  . sevelamer carbonate (RENVELA) 800 MG tablet Take 2,400 mg by mouth See admin instructions. Take 5 times a day and two times a day with snacks     Allergies: Allergies as of 04/24/2019  . (No Known Allergies)   Past Medical History:  Diagnosis Date  . Diabetes mellitus without  complication ([place])   . Diabetic gastroparesis ([place])   . Dialysis patient ([place])   . Hypertension   . Renal disorder    Dialysis  . Sepsis ([place])     Family History:   DM-parents HTN-both parents  ESRD-Father  Social History:   Lives in [place] with his mother  On disability  Does not smoke, drink etoh, do drugs  Review of Systems: A complete ROS was negative except as per HPI.   Physical Exam: Blood pressure (!) 136/57, pulse 98, temperature 99.6 F (37.6 C), temperature source Oral, resp. rate 11, height 5' 5" (1.651 m), weight 74.8 kg, SpO2 97 %.  Physical Exam  Constitutional: He is oriented to person, place, and time. He appears well-developed and well-nourished. He appears listless. He has a sickly appearance. He appears distressed.  HENT:  Head: Normocephalic and atraumatic.  Eyes: Conjunctivae are normal.  Cardiovascular: Normal rate, regular rhythm and normal heart sounds.  Respiratory: Effort normal and breath sounds normal. No respiratory distress. He has no wheezes.  GI: Soft. Bowel sounds are normal. He exhibits no distension. There is abdominal tenderness (generalized).  Musculoskeletal:        General: No edema.  Neurological: He is oriented to person, place, and time. He appears listless.  Skin: He is not diaphoretic.  1st Left MTP with 2cm ulceration. Able to visualize tendons on the right plantar aspect   Psychiatric: He has a normal mood and affect. His behavior is normal. Judgment and thought content normal.    Labs CMP: Na 129, K 4.2, bicarb 21, CR 13.86, AST 19, ALT 13, albumin 3.3, T bili 1.8, AG 22, ALP 182 CBC: 12.7, Hb 17.5, PLT 135 Mg: 3.0 Blood culture pending Lactic acid 1.3 UA pending Urine culture pending COVID-19 negative I-STAT: pH 7.495, PCO2 32, PO2 30, bicarb 24.7 C. difficile antigen positive, toxin negative, C. difficile PCR negative PT 15.4, INR 1.2, APTT 40 ESR 4, CRP 18.8 GI pathogen panel pending  CT head  No acute  intracranial abnormalities.  Calcified scalp vessel atherosclerosis  EKG: personally reviewed my interpretation is sinus rhythm without st or t wave changes  CXR: personally reviewed my interpretation is no effusions, edema, or increased interstitial markings.  Assessment & Plan by Problem:  Mr. Hilleary is a 38 year-old male with ESRD on HD MWF, HTN, Insulin-dependent diabetes mellitus, diabetic gastroparesis, and diabetic foot ulcer who presents with nausea, vomiting, confusion, and decreased appetite for the past 3 days.   Nausea, Vomiting, Diarrhea The patient has been having few day history of worsening nausea, vomiting, and diarrhea. Patient has not had any changes in stool color or drop in hb to suggest a GI bleed. No elevation in transaminases, but elevated alp at 182. Tbili is also elevated at 1.8. Per CT abdomen pelvis done in September 2015  the patient had gastric wall thickening suggestive of gastritits vs gastric neoplasm. He also had a 3.1cm mass on lateral left lobe of liver and in right lobe of liver suggestive of hemangioma. CT abd repeated in Jan 2020 did not suggest any acute abdominal or pelvic abnormalities. If symptoms persist patient should have repeat abdominal imaging.  Given that patient has a history of gastroparesis it is possible that he is having a flare. Holding ancef which has adverse reaction of diarrhea. Patient likely does not have c dif as pcr is negative, toxin negative and only antigen is positive.   -hold ancef -pending gi pathogen panel -pending blood and urine culture  -keeping patient npo to rest bowels -IV reglan 8m bid -IV phenergan -IV protonix 488mqd -Nutrition consult to education about gastroparesis diet  -Consider GI consult    Transient acute encephalopathy Patient presented with altered mentation according to providers at his dialysis  center and ED provider. He is not on any centrally acting medication at home. He does have an elevated  temperature with mildly elevated wbc= 12.7 indicating possible infectious cause. Probable GI infection. Getting Gi pathogen panel. Also checking UA and urine culture. CT head did not reveal any acute intracranial abnormality. Also patient did not have his dialysis this morning so he may have some degree of uremia. Hyponatremia can also worsen his mentation.  -Consulted nephrology for HD session  -Monitor for worsening mentation   Hyponatremia The patient sodium on admission was 129 without any significant hyperglycemia. He likely has hypovolemic hyponatremia from extrarenal losses-vomiting and diarrhea   -Repleting with isotonic IVF  Hypertension The patient's blood pressure since admission has ranged 100-130/60-70s. HE takes losartan 506md at home.   -Continue losartan 50 mg daily  ESRD [facility] from left sided avf. Creatining 13.86, potassium 4.2, bicarb 21.   -nephrology consulted, HD tomorrow 7/28 -continue sevlamer  -continue sensipar  Insulin-dependent diabetes mellitus Diabetic foot ulcer s/p L I&D abscess, hallux amputation with debridement and wound subsequent partial closure  Diabetic gastroparesis Patient's last A1c was 6.7 in January 2020, which maybe falsely low due to ESRD.  Patient is Lantus 15 units nightly, NovoLog 6-7 units twice daily with meals.  Patient had a recent visit with Dr. PriMarch Rummage 04/14/19 during which time he had an excisional debridement of his wound. The patient has an increased crp, mildly elevated wbc, and elevated temperature, with serosanguinous discharge from plantar aspect of left foot which is concerning for possible infection. Will get culture and imaging to further evaluate.  -Diet NPO  -lantus 5 units -ssi -Monitor glucose -got vancomycin, ceftriaxone in ED -wound culture pending -Pending left foot xray, if negative get MRI -Informed Dr. [facility]) that patient has been admitted, stated that he  will evaluate the ulcer     Polycythemia Patient's hb has increased from 8.3 to 17.5.   -blood smear -Will get differential  Dispo: Admit patient to Inpatient with expected length of stay greater than 2 midnights.  Signed: CLars MageD 04/24/2019, 5:16 PM  Pager: [phone]

## 2019-04-24 NOTE — ED Notes (Signed)
Pt refused rectal temperature 

## 2019-04-24 NOTE — ED Triage Notes (Addendum)
Pt arrives [facility] from dialysis with confusion and staff c/o N/V. Pt makes comments on arrival about "going to the game." Pt drove to dialysis. Pt told dialysis staff he had not eaten or drank anything for 3 days due to N/;V. Dressing noted to left upper arm

## 2019-04-24 NOTE — ED Notes (Signed)
Pt now screaming, "get my dr. In here> " Explained to pt I was trying to get MD.

## 2019-04-24 NOTE — Consult Note (Addendum)
Reason for Consult: Left lower extremity wound Referring Physician: Internal medicine teaching service  [name] is an 38 y.o. male.  HPI: Patient is a poor historian appears very somnolent and wades in and out of the conversation.  States that he did not have the money to afford the medication he was prescribed at last visit to care for his wound and he was to embarrassed to tell me.  It is unclear if and how he has been taking care of his foot wound.  It is unclear if he has been wearing a surgical shoe. It appears that the wound has significantly worsened since his last visit.  Past Medical History:  Diagnosis Date  . Diabetes mellitus without complication ([place])   . Diabetic gastroparesis ([place])   . Dialysis patient ([place])   . Hypertension   . Renal disorder    Dialysis  . Sepsis [facility])     Past Surgical History:  Procedure Laterality Date  . AMPUTATION Right 02/02/2018   Procedure: RIGHT FIFTH TOE AND METATARSAL AMPUTATION. Filetted toe flap metatarsal resection. Debridement Plantar Foot wound;  Surgeon: Evelina Bucy, DPM;  Location: [place];  Service: Podiatry;  Laterality: Right;  . AMPUTATION Left 08/20/2018   Procedure: FIFTH METATARSAL BONE BIOPSY;  Surgeon: Evelina Bucy, DPM;  Location: [place];  Service: Podiatry;  Laterality: Left;  . AMPUTATION Left 10/28/2018   Procedure: LEFT GREAT TOE AMPUTATION;  Surgeon: Evelina Bucy, DPM;  Location: [place];  Service: Podiatry;  Laterality: Left;  . APPLICATION OF WOUND VAC  02/02/2018   Procedure: APPLICATION OF WOUND VAC  Right Foot;  Surgeon: Evelina Bucy, DPM;  Location: [place];  Service: Podiatry;;  . APPLICATION OF WOUND VAC Left 10/28/2018   Procedure: APPLICATION OF WOUND VAC LEFT TOE;  Surgeon: Evelina Bucy, DPM;  Location: [place];  Service: Podiatry;  Laterality: Left;  . APPLICATION OF WOUND VAC Left 11/01/2018   Procedure: APPLICATION OF WOUND VAC;  Surgeon: Evelina Bucy, DPM;  Location: [place];  Service:  Podiatry;  Laterality: Left;  . AV FISTULA PLACEMENT     left arm.  . AV FISTULA PLACEMENT Right 12/22/2016   Procedure: INSERTION OF ARTERIOVENOUS (AV) GORE-TEX GRAFT ARM;  Surgeon: Elam Dutch, MD;  Location: [facility] OR;  Service: Vascular;  Laterality: Right;  . AV FISTULA PLACEMENT Left 05/26/2018   Procedure: INSERTION OF  ARTERIOVENOUS (AV) GORE-TEX GRAFT LEFT ARM;  Surgeon: Serafina Mitchell, MD;  Location: [place];  Service: Vascular;  Laterality: Left;  . EYE SURGERY    . I&D EXTREMITY Right 10/31/2017   Procedure: IRRIGATION AND DEBRIDEMENT RIGHT FOOT;  Surgeon: Evelina Bucy, DPM;  Location: [place];  Service: Podiatry;  Laterality: Right;  . I&D EXTREMITY Left 08/20/2018   Procedure: IRRIGATION AND DEBRIDEMENT EXTREMITY WITH SECONDARY WOUND CLOSUREAND APPLICATION OF WOUND VAC LEFT FOOT;  Surgeon: Evelina Bucy, DPM;  Location: [place];  Service: Podiatry;  Laterality: Left;  . I&D EXTREMITY Left 10/20/2018   Procedure: IRRIGATION AND DEBRIDEMENT LEFT FOOT  DEBRIDEMENT LATERAL FOOT WOUND;  Surgeon: Evelina Bucy, DPM;  Location: [place];  Service: Podiatry;  Laterality: Left;  . I&D EXTREMITY Left 10/28/2018   Procedure: IRRIGATION AND DEBRIDEMENT LEFT TOE;  Surgeon: Evelina Bucy, DPM;  Location: [place];  Service: Podiatry;  Laterality: Left;  . INSERTION OF DIALYSIS CATHETER     Right subclavian  . IR AV DIALY SHUNT INTRO NEEDLE/INTRACATH INITIAL W/PTA/IMG RIGHT Right  02/05/2018  . IR THROMBECTOMY AV FISTULA W/THROMBOLYSIS/PTA INC/SHUNT/IMG LEFT Left 08/24/2018  . IR THROMBECTOMY AV FISTULA W/THROMBOLYSIS/PTA INC/SHUNT/IMG LEFT Left 01/06/2019  . IR US GUIDE VASC ACCESS LEFT  08/24/2018  . IR US GUIDE VASC ACCESS RIGHT  02/05/2018  . IRRIGATION AND DEBRIDEMENT FOOT Right 10/23/2018   Procedure: Irrigation and Debridement to tendon, Left Foot;  Surgeon: Evelina Bucy, DPM;  Location: [place];  Service: Podiatry;  Laterality: Right;  . IRRIGATION AND DEBRIDEMENT FOOT Left 11/01/2018    Procedure: IRRIGATION AND DEBRIDEMENT PARTIAL WOUND CLOSURE LOCAL TISSUE TRANSFER AND FLAP ROTATION, LEFT FOOT;  Surgeon: Evelina Bucy, DPM;  Location: [place];  Service: Podiatry;  Laterality: Left;  . TRANSMETATARSAL AMPUTATION N/A 08/18/2018   Procedure: IRRIGATION AND DEBRIDEMENT OF LEFT 5TH TOE AND TRANSMETATARSAL, WITH PARTICAL LEFT 5TH TOE AND METATARSAL AMPUTATION, BONE BIOPSY, WOUND VAC APPLICATION.;  Surgeon: Evelina Bucy, DPM;  Location: [place];  Service: Podiatry;  Laterality: N/A;  . UPPER EXTREMITY VENOGRAPHY N/A 11/16/2016   Procedure: Upper Extremity Venography - Right Central;  Surgeon: Elam Dutch, MD;  Location: [place] [facility];  Service: Cardiovascular;  Laterality: N/A;  . UPPER EXTREMITY VENOGRAPHY N/A 05/25/2018   Procedure: UPPER EXTREMITY VENOGRAPHY - Bilateral;  Surgeon: Marty Heck, MD;  Location: [place] [facility];  Service: Cardiovascular;  Laterality: N/A;    Family History  Problem Relation Age of Onset  . Diabetes Mellitus II Other   . Diabetes Father   . Renal Disease Father        ESRD    Social History:  reports that he has never smoked. He has never used smokeless tobacco. He reports that he does not drink alcohol or use drugs.  Allergies: No Known Allergies  Medications: I have reviewed the patient's current medications.  Results for orders placed or performed during the hospital encounter of 04/24/19 (from the past 48 hour(s))  CBG monitoring, ED     Status: Abnormal   Collection Time: 04/24/19  7:11 AM  Result Value Ref Range   Glucose-Capillary 126 (H) 70 - 99 mg/dL  Comprehensive metabolic panel     Status: Abnormal   Collection Time: 04/24/19  7:26 AM  Result Value Ref Range   Sodium 129 (L) 135 - 145 mmol/L   Potassium 4.2 3.5 - 5.1 mmol/L   Chloride 86 (L) 98 - 111 mmol/L   CO2 21 (L) 22 - 32 mmol/L   Glucose, Bld 131 (H) 70 - 99 mg/dL   BUN 60 (H) 6 - 20 mg/dL   Creatinine, Ser 13.86 (H) 0.61 - 1.24 mg/dL    Calcium 9.5 8.9 - 10.3 mg/dL   Total Protein 7.7 6.5 - 8.1 g/dL   Albumin 3.3 (L) 3.5 - 5.0 g/dL   AST 19 15 - 41 U/L   ALT 13 0 - 44 U/L   Alkaline Phosphatase 182 (H) 38 - 126 U/L   Total Bilirubin 1.8 (H) 0.3 - 1.2 mg/dL   GFR calc non Af Amer 4 (L) >60 mL/min   GFR calc Af Amer 5 (L) >60 mL/min   Anion gap 22 (H) 5 - 15    Comment: Performed at [facility], [address], [place] [address]  CBC     Status: Abnormal   Collection Time: 04/24/19  7:26 AM  Result Value Ref Range   WBC 12.7 (H) 4.0 - 10.5 K/uL   RBC 5.69 4.22 - 5.81 MIL/uL   Hemoglobin  17.5 (H) 13.0 - 17.0 g/dL   HCT 52.1 (H) 39.0 - 52.0 %   MCV 91.6 80.0 - 100.0 fL   MCH 30.8 26.0 - 34.0 pg   MCHC 33.6 30.0 - 36.0 g/dL   RDW 12.7 11.5 - 15.5 %   Platelets 135 (L) 150 - 400 K/uL   nRBC 0.0 0.0 - 0.2 %    Comment: Performed at [facility] [address] [place], [place] [address]  Magnesium     Status: Abnormal   Collection Time: 04/24/19  7:26 AM  Result Value Ref Range   Magnesium 3.0 (H) 1.7 - 2.4 mg/dL    Comment: Performed at [facility] [address], [place], [place] [address]  Lactic acid, plasma     Status: None   Collection Time: 04/24/19  8:03 AM  Result Value Ref Range   Lactic Acid, Venous 1.3 0.5 - 1.9 mmol/L    Comment: Performed at [facility] [address], [place], [place] [address]  SARS Coronavirus 2 (CEPHEID - Performed in [place] hospital lab), Hosp Order     Status: None   Collection Time: 04/24/19  8:06 AM   Specimen: Nasopharyngeal Swab  Result Value Ref Range   SARS Coronavirus 2 NEGATIVE NEGATIVE    Comment: (NOTE) If result is NEGATIVE SARS-CoV-2 target nucleic acids are NOT DETECTED. The SARS-CoV-2 RNA is generally detectable in upper and lower  respiratory specimens during the acute phase of infection. The lowest  concentration of SARS-CoV-2 viral copies this assay can detect is 250  copies / mL. A negative result does not  preclude SARS-CoV-2 infection  and should not be used as the sole basis for treatment or other  patient management decisions.  A negative result may occur with  improper specimen collection / handling, submission of specimen other  than nasopharyngeal swab, presence of viral mutation(s) within the  areas targeted by this assay, and inadequate number of viral copies  (<250 copies / mL). A negative result must be combined with clinical  observations, patient history, and epidemiological information. If result is POSITIVE SARS-CoV-2 target nucleic acids are DETECTED. The SARS-CoV-2 RNA is generally detectable in upper and lower  respiratory specimens dur ing the acute phase of infection.  Positive  results are indicative of active infection with SARS-CoV-2.  Clinical  correlation with patient history and other diagnostic information is  necessary to determine patient infection status.  Positive results do  not rule out bacterial infection or co-infection with other viruses. If result is PRESUMPTIVE POSTIVE SARS-CoV-2 nucleic acids MAY BE PRESENT.   A presumptive positive result was obtained on the submitted specimen  and confirmed on repeat testing.  While 2019 novel coronavirus  (SARS-CoV-2) nucleic acids may be present in the submitted sample  additional confirmatory testing may be necessary for epidemiological  and / or clinical management purposes  to differentiate between  SARS-CoV-2 and other Sarbecovirus currently known to infect humans.  If clinically indicated additional testing with an alternate test  methodology [phone]) is advised. The SARS-CoV-2 RNA is generally  detectable in upper and lower respiratory sp ecimens during the acute  phase of infection. The expected result is Negative. Fact Sheet for Patients:  StrictlyIdeas.no Fact Sheet for Healthcare Providers: BankingDealers.co.za This test is not yet approved or cleared by  the [place] FDA and has been authorized for detection and/or diagnosis of SARS-CoV-2 by FDA under an Emergency Use Authorization (EUA).  This EUA  will remain in effect (meaning this test can be used) for the duration of the COVID-19 declaration under Section 564(b)(1) of the Act, 21 U.S.C. section 360bbb-3(b)(1), unless the authorization is terminated or revoked sooner. Performed at [facility], [place] [address], [place], [place] [address]   POCT I-Stat EG7     Status: Abnormal   Collection Time: 04/24/19  8:14 AM  Result Value Ref Range   pH, Ven 7.495 (H) 7.250 - 7.430   pCO2, Ven 32.0 (L) 44.0 - 60.0 mmHg   pO2, Ven 30.0 (LL) 32.0 - 45.0 mmHg   Bicarbonate 24.7 20.0 - 28.0 mmol/L   TCO2 26 22 - 32 mmol/L   O2 Saturation 63.0 %   Acid-Base Excess 2.0 0.0 - 2.0 mmol/L   Sodium 128 (L) 135 - 145 mmol/L   Potassium 4.1 3.5 - 5.1 mmol/L   Calcium, Ion 0.99 (L) 1.15 - 1.40 mmol/L   HCT 56.0 (H) 39.0 - 52.0 %   Hemoglobin 19.0 (H) 13.0 - 17.0 g/dL   Patient temperature HIDE    Sample type VENOUS    Comment NOTIFIED PHYSICIAN   C difficile quick scan w PCR reflex     Status: Abnormal   Collection Time: 04/24/19  8:19 AM   Specimen: Nasopharyngeal Swab; Stool  Result Value Ref Range   C Diff antigen POSITIVE (A) NEGATIVE   C Diff toxin NEGATIVE NEGATIVE   C Diff interpretation Results are indeterminate. See PCR results.     Comment: Performed at [facility], [place] [address] [place], [place] [address]  Protime-INR     Status: Abnormal   Collection Time: 04/24/19  8:19 AM  Result Value Ref Range   Prothrombin Time 15.4 (H) 11.4 - 15.2 seconds   INR 1.2 0.8 - 1.2    Comment: (NOTE) INR goal varies based on device and disease states. Performed at [facility], [place] [address] [place], [place] [address]   APTT     Status: Abnormal   Collection Time: 04/24/19  8:19 AM  Result Value Ref Range   aPTT 40 (H) 24 - 36 seconds    Comment:        IF BASELINE  aPTT IS ELEVATED, SUGGEST PATIENT RISK ASSESSMENT BE USED TO DETERMINE APPROPRIATE ANTICOAGULANT THERAPY. Performed at [facility], [place] [address] [place], [place] [address]   C. Diff by PCR, Reflexed     Status: None   Collection Time: 04/24/19  8:19 AM  Result Value Ref Range   Toxigenic C. Difficile by PCR NEGATIVE NEGATIVE    Comment: Patient is colonized with non toxigenic C. difficile. May not need treatment unless significant symptoms are present. Performed at [facility], [place] [address], [place], [place] [address]   Sedimentation rate     Status: None   Collection Time: 04/24/19  9:21 AM  Result Value Ref Range   Sed Rate 4 0 - 16 mm/hr    Comment: Performed at [facility] [address] [place], [place] [address]  C-reactive protein     Status: Abnormal   Collection Time: 04/24/19  9:21 AM  Result Value Ref Range   CRP 18.8 (H) <1.0 mg/dL    Comment: Performed at [facility] [address], [place], [place] [address]  CBG monitoring, ED     Status: Abnormal   Collection Time: 04/24/19  6:36 PM  Result Value Ref Range   Glucose-Capillary 337 (  H) 70 - 99 mg/dL    Ct Head W Or Wo Contrast  Result Date: 04/24/2019 CLINICAL DATA:  38 year old male with confusion, dialysis patient, possible sepsis. EXAM: CT HEAD WITHOUT AND WITH CONTRAST TECHNIQUE: Contiguous axial images were obtained from the base of the skull through the vertex without and with intravenous contrast CONTRAST:  20mL OMNIPAQUE IOHEXOL 300 MG/ML  SOLN COMPARISON:  None. FINDINGS: Brain: Incidental bulky dural calcification along the falx. No midline shift, ventriculomegaly, mass effect, evidence of mass lesion, intracranial hemorrhage or evidence of cortically based acute infarction. Gray-white matter differentiation is within normal limits throughout the brain. No abnormal enhancement identified. Vascular: Calcified atherosclerosis at the skull base. The major intracranial vascular  structures appear to enhance as expected. Skull: Negative. Sinuses/Orbits: Partially visible left maxillary retention cyst, otherwise clear. Other: Calcified scalp vessel atherosclerosis. No acute orbit or scalp soft tissue finding. IMPRESSION: 1. Normal for age CT appearance of the brain. 2. Calcified scalp vessel atherosclerosis, frequently seen in in stage renal disease/dialysis. Electronically Signed   By: Genevie Ann M.D.   On: 04/24/2019 11:05   Dg Chest Port 1 View  Result Date: 04/24/2019 CLINICAL DATA:  38 year old male dialysis patient with confusion, possible sepsis. EXAM: PORTABLE CHEST 1 VIEW COMPARISON:  10/20/2018 and earlier. FINDINGS: Portable AP semi upright view at 0813 hours. Lung volumes and mediastinal contours remain within normal limits. Visualized tracheal air column is within normal limits. Allowing for portable technique the lungs are clear. Stable right superior mediastinum vascular stent. Increased vascular stents in left axilla since January. Stable right axillary vascular stent. No osseous abnormality identified. Negative visible bowel gas pattern. IMPRESSION: No acute cardiopulmonary abnormality. Electronically Signed   By: Genevie Ann M.D.   On: 04/24/2019 08:28   Dg Foot Complete Left  Result Date: 04/24/2019 CLINICAL DATA:  Diabetic left foot ulcer distal to the first metatarsal. Previous amputations. EXAM: LEFT FOOT - COMPLETE 3+ VIEW COMPARISON:  03/16/2019 FINDINGS: Sequelae of first toe amputation are again identified. Erosion of the first metatarsal head has not significantly progressed from the prior study, however there is progressive erosion involving the adjacent medial sesamoid bone. Mixed erosive and sclerotic changes are again seen involving the base of the proximal phalanx of the second toe which appears to reflect in part a healing fracture without evidence of progressive erosion. Sequelae of fifth ray amputation are again noted sparing only the base of the fifth  metatarsal without evidence of interval osseous erosion. Diffuse soft tissue swelling in the foot is noted with atherosclerotic vascular calcifications. IMPRESSION: 1. Progressive erosion of the medial first metatarsal sesamoid consistent with osteomyelitis. 2. Unchanged erosion of the first metatarsal head. 3. Chronic erosive and sclerotic changes of the second toe proximal phalanx with evidence of a healing fracture. Electronically Signed   By: [name] Bores M.D.   On: 04/24/2019 18:23    ROS Blood pressure (!) 119/56, pulse (!) 103, temperature 99.6 F (37.6 C), temperature source Oral, resp. rate (!) 22, height 5\' 5"  (1.651 m), weight 74.8 kg, SpO2 96 %. Physical Exam  Left lower extremity wound with purulence and probe to bone.  Wound probes fully deep to the dorsal aspect of the foot.  Surrounding warmth erythema.  Wound with central necrosis mild malodor.  Pedal pulses nonpalpable.  Epicritic sensation to light touch absent.  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 -XR reviewed c/w OM of 1st Metatarsal and  tibial sesamoid. -Purulent ulcer noted on exam.  Bedside incision and drainage performed after verbal consent. -Following sterile skin prep with Betadine and 11 blade was used to incise the fluctuant area of the ulcer at the plantar aspect of the foot.  Purulence was expressed.  The wound was then sharply excisionally debrided with scissors and forceps. The wound was explored and the wound probed to soft bone. The wound additionally probed to the dorsal skin. A deep tissue culture was collected for microbiology.  The wound was then copiously irrigated with a liter of sterile water.  The foot was then dressed with 4 x 4 Kerlix and Ace bandage. -Ultimately he will need at minimum partial 1st ray amputation (removal of part of the 1st metatarsal and sesamoid complex). He probably  needs a transmetatarsal amputation but he stated today that he does not want any more surgery.  Should he be amenable to surgery would consider partial ray resection later this week. -Patient has extensive history of non-compliance, which will likely complicate his care and jeopardize outcome. -Should he continue to refuse surgery, hopefully the tissue culture could guide therapy.  He will require extended antibiotic therapy.  Evelina Bucy 04/24/2019, 7:27 PM

## 2019-04-24 NOTE — Telephone Encounter (Signed)
Consulted by IM team will see patient.

## 2019-04-24 NOTE — ED Notes (Signed)
Attempted to apply duragesic patch. Pt states "Hell no!" Statews get that dioctor back in here. I want medicine that goes in my IV,." Will inform provider.

## 2019-04-24 NOTE — Progress Notes (Signed)
Subjective:  Patient ID: [name], male    DOB: [birth year],  MRN: [id number]  Chief Complaint  Patient presents with  . Wound Check    pt is here for wound care on the left foot, pt states that he is doing better and that he has no comments or concerns   38 y.o. male presents for wound care follow-up. States he threw out his surgical shoes. Denies new issues. Did not follow up with ID as directed. Wearing normal slide sandals.  Review of Systems: Negative except as noted in the HPI. Denies N/V/F/Ch.  Past Medical History:  Diagnosis Date  . Diabetes mellitus without complication ([place])   . Diabetic gastroparesis ([place])   . Dialysis patient ([place])   . Hypertension   . Renal disorder    Dialysis  . Sepsis ([place])    No current facility-administered medications for this visit.   Current Outpatient Medications:  .  calcitRIOL (ROCALTROL) 0.5 MCG capsule, Take 5 capsules (2.5 mcg total) by mouth every Monday, Wednesday, and Friday with hemodialysis., Disp: 30 capsule, Rfl: 0 .  ceFAZolin (ANCEF) 2-4 GM/100ML-% IVPB, Inject 100 mLs (2 g total) into the vein every Monday, Wednesday, and Friday with hemodialysis., Disp: 1 each, Rfl: 7 .  cinacalcet (SENSIPAR) 30 MG tablet, Take 30 mg by mouth daily with supper., Disp: , Rfl:  .  clonazePAM (KLONOPIN) 0.5 MG tablet, Take 0.5 mg by mouth daily as needed for anxiety (sleep). , Disp: , Rfl:  .  collagenase (SANTYL) ointment, Apply 1 application topically daily., Disp: 15 g, Rfl: 0 .  collagenase (SANTYL) ointment, Apply 1 application topically daily. Left 1st metatarsal amputation site measuring 3.0 x 3.0 x 0.5cm, Disp: 15 g, Rfl: 5 .  glucose blood ([employer] TEST STRIPS) test strip, by Other route 4 (four) times a day as needed., Disp: , Rfl:  .  HYDROcodone-acetaminophen (NORCO/VICODIN) 5-325 MG tablet, Take 1-2 tablets by mouth every 6 (six) hours as needed for moderate pain or severe pain., Disp: 20 tablet, Rfl: 0 .  insulin aspart (NOVOLOG)  100 UNIT/ML injection, Inject 6-7 Units into the skin 2 (two) times daily with a meal. , Disp: , Rfl:  .  insulin glargine (LANTUS) 100 UNIT/ML injection, Inject 0.15 mLs (15 Units total) into the skin at bedtime., Disp: 10 mL, Rfl: 1 .  multivitamin (RENA-VIT) TABS tablet, Take 1 tablet by mouth at bedtime., Disp: 30 tablet, Rfl: 0 .  ondansetron (ZOFRAN ODT) 4 MG disintegrating tablet, Take 1 tablet (4 mg total) by mouth every 8 (eight) hours as needed for nausea or vomiting., Disp: 10 tablet, Rfl: 0 .  sevelamer carbonate (RENVELA) 800 MG tablet, Take 2,400-3,200 mg by mouth See admin instructions. Take 3 - 4 tablets (2400 mg -3200 mg) by mouth two times daily with meals, Disp: , Rfl:  .  traMADol (ULTRAM) 50 MG tablet, , Disp: , Rfl:  .  silver sulfADIAZINE (SILVADENE) 1 % cream, Apply pea-sized amount to wound daily., Disp: 50 g, Rfl: 0  Facility-Administered Medications Ordered in Other Visits:  .  acyclovir (ZOVIRAX) 375 mg in dextrose 5 % 100 mL IVPB, 5 mg/kg, Intravenous, Once, Romona Curls, RPH .  [START ON 04/25/2019] acyclovir (ZOVIRAX) 375 mg in dextrose 5 % 100 mL IVPB, 5 mg/kg, Intravenous, Q24H, Romona Curls, RPH .  cefTRIAXone (ROCEPHIN) 2 g in sodium chloride 0.9 % 100 mL IVPB, 2 g, Intravenous, Once, Nanavati, Ankit, MD .  sodium chloride flush (NS) 0.9 % injection  3 mL, 3 mL, Intravenous, Once, Nanavati, Ankit, MD .  vancomycin (VANCOCIN) 1,750 mg in sodium chloride 0.9 % 500 mL IVPB, 1,750 mg, Intravenous, Once, Romona Curls, RPH .  [START ON 04/25/2019] vancomycin variable dose per unstable renal function (pharmacist dosing), , Does not apply, See admin instructions, Romona Curls, [facility]  Social History   Tobacco Use  Smoking Status Never Smoker  Smokeless Tobacco Never Used    No Known Allergies Objective:   There were no vitals filed for this visit. There is no height or weight on file to calculate BMI. Constitutional Well developed.  Vascular Left foot warm to  touch  Neurologic Oriented to person, place, and time. Epicritic sensation to light touch grossly absent bilaterally.  Dermatologic Wound left 1st metatarsal area fully healed no open ulcerations noted. Residual edema noted left forefoot.    Orthopedic: Left hallux and fifth toe amputations noted.   Radiographs: none Assessment:   1. Diabetic ulcer of left midfoot associated with diabetes mellitus due to underlying condition, with necrosis of muscle ([place])    Plan:  Patient was evaluated and treated and all questions answered.  S/p L I&D of Abscess, Hallux Amputation, with debridement and wound subsequent partial closure -Wound remains healed. -No warmth, erythema, signs of infection noted today. -States he never followed up with ID. States he had a "lot going on" and did not call them back if he did get a call.   DM with amputation Hx -Again discussed importance of DM shoegear. He has thrown out his post-op shoes because he thought he was "done with them now that his wound healed. He has developed a new wound to his foot at the plantar aspect of the 1st metatarsal. It is superficial without signs of infection. Rx silvadene. Discussed need for own dressing changes. Discussed importance of surgical shoes until he gets diabetic shoes. He has been non-compliant with his appointments, both with our office and with f/u with ID. He has been non-compliant with wearing of his surgical shoes. He was given a new shoe today and the importance of wearing it was discussed for wound healing.  Procedure: Excisional Debridement of Wound Rationale: Removal of non-viable soft tissue from the wound to promote healing.  Anesthesia: none Pre-Debridement Wound Measurements: overlying HPK  Post-Debridement Wound Measurements: 1 cm x 1.5 cm x 0.2 cm  Type of Debridement: Sharp Excisional Tissue Removed: Non-viable soft tissue Depth of Debridement: subcutaneous tissue. Technique: Sharp excisional debridement to  bleeding, viable wound base.  Dressing: Dry, sterile, compression dressing. Disposition: Patient tolerated procedure well. Patient to return in 1 week for follow-up.   Return in about 2 weeks (around 04/28/2019).

## 2019-04-24 NOTE — Progress Notes (Signed)
Patient administered  12 units of insulin he brought from home this evening to himself and refused cardiac monitoring at this time. [place],MD notified and stated to hold scheduled night time insulin. Will continue to monitor.   Celestia Khat

## 2019-04-24 NOTE — ED Notes (Signed)
Dialysis MD at bedside.

## 2019-04-24 NOTE — ED Notes (Signed)
Patient transported to CT 

## 2019-04-24 NOTE — Telephone Encounter (Signed)
Pt is having issues with his foot, he is at [facility]. He is getting admitted. He has not ate for 3 days. Pt would like for Dr. March Rummage to come to the hospital.

## 2019-04-24 NOTE — ED Notes (Addendum)
Previous lactic wnl. Pt states he is unable to provide urine.

## 2019-04-24 NOTE — ED Notes (Addendum)
MD aware of no rectal per pt and iv status

## 2019-04-24 NOTE — ED Notes (Signed)
Pt speaking loudly and rapidly. "If my doctor doesn't come in here im leaving. Pt removes bp cuff.

## 2019-04-24 NOTE — ED Notes (Signed)
Pt does not allow neuro check

## 2019-04-24 NOTE — ED Notes (Signed)
Pt cleaned of diarrhea stool

## 2019-04-24 NOTE — ED Notes (Signed)
Pt arrives with post op shoe on left foot. Pt no longer answers questions but remains alert and follows some commands.

## 2019-04-24 NOTE — ED Notes (Signed)
Pt has "dry heaves" and request further pain medication. Will nform provider.

## 2019-04-24 NOTE — ED Provider Notes (Signed)
[facility] EMERGENCY DEPARTMENT Provider Note   CSN: [id number] Arrival date & time: 04/24/19  0706    History   Chief Complaint Chief Complaint  Patient presents with  . Altered Mental Status  . Nausea  . Emesis    HPI [name] is a 38 y.o. male.     HPI  Level 5 caveat for altered mental status. 38 year old male with history of diabetes, gastroparesis, ESRD on HD Monday, Wednesday, Friday comes in a chief complaint of altered mental status.  Patient went to his dialysis center and was sent to the ER because he was altered.  Patient has been speaking of wanting to go to the ballgame.  It appears that he has a diabetic foot ulcer for which he is on Ancef right now.  Patient does not provide any significantly meaningful history.  He tells me that his name is [name] and he is not oriented to time or place.  He has no complaints from his side and wants to go home so that he can go watch a ballgame.    Past Medical History:  Diagnosis Date  . Diabetes mellitus without complication ([place])   . Diabetic gastroparesis ([place])   . Dialysis patient ([place])   . Hypertension   . Renal disorder    Dialysis  . Sepsis [facility])     Patient Active Problem List   Diagnosis Date Noted  . Open wound of left foot   . Encounter for orthopedic aftercare following surgical amputation   . Foot infection   . Gangrene of toe of left foot ([place])   . Cellulitis 10/26/2018  . Diabetic ulcer of left midfoot associated with diabetes mellitus due to underlying condition, with bone involvement without evidence of necrosis ([place])   . Nausea vomiting and diarrhea 10/20/2018  . Septic arthritis of left foot ([place])   . DM (diabetes mellitus), secondary, uncontrolled, with neurologic complications ([place])   . Osteomyelitis of foot, left, acute ([place])   . Acute osteomyelitis of left ankle or foot ([place])   . Osteomyelitis of fifth toe of left foot ([place])   . Anemia of chronic disease 08/18/2018   . Osteomyelitis ([place]) 02/01/2018  . Osteomyelitis of ankle or foot, acute, right ([place]) 10/30/2017  . Cellulitis and abscess of toe of left foot   . Diabetic foot infection ([place])   . Insulin dependent diabetes mellitus ([place]) 10/25/2017  . Diabetic gastroparesis ([place]) 10/25/2017  . Sepsis ([place]) 10/25/2017  . HTN (hypertension) 08/09/2014  . ESRD (end stage renal disease) on dialysis ([place]) 03/19/2014  . Bacteremia due to coagulase-negative Staphylococcus 05/31/2013  . Metabolic acidosis 07/14/5101    Past Surgical History:  Procedure Laterality Date  . AMPUTATION Right 02/02/2018   Procedure: RIGHT FIFTH TOE AND METATARSAL AMPUTATION. Filetted toe flap metatarsal resection. Debridement Plantar Foot wound;  Surgeon: Evelina Bucy, DPM;  Location: [place];  Service: Podiatry;  Laterality: Right;  . AMPUTATION Left 08/20/2018   Procedure: FIFTH METATARSAL BONE BIOPSY;  Surgeon: Evelina Bucy, DPM;  Location: [place];  Service: Podiatry;  Laterality: Left;  . AMPUTATION Left 10/28/2018   Procedure: LEFT GREAT TOE AMPUTATION;  Surgeon: Evelina Bucy, DPM;  Location: [place];  Service: Podiatry;  Laterality: Left;  . APPLICATION OF WOUND VAC  02/02/2018   Procedure: APPLICATION OF WOUND VAC  Right Foot;  Surgeon: Evelina Bucy, DPM;  Location: [place];  Service: Podiatry;;  . APPLICATION OF WOUND VAC Left 10/28/2018  Procedure: APPLICATION OF WOUND VAC LEFT TOE;  Surgeon: Evelina Bucy, DPM;  Location: [place];  Service: Podiatry;  Laterality: Left;  . APPLICATION OF WOUND VAC Left 11/01/2018   Procedure: APPLICATION OF WOUND VAC;  Surgeon: Evelina Bucy, DPM;  Location: [place];  Service: Podiatry;  Laterality: Left;  . AV FISTULA PLACEMENT     left arm.  . AV FISTULA PLACEMENT Right 12/22/2016   Procedure: INSERTION OF ARTERIOVENOUS (AV) GORE-TEX GRAFT ARM;  Surgeon: Elam Dutch, MD;  Location: [facility] OR;  Service: Vascular;  Laterality: Right;  . AV FISTULA PLACEMENT Left 05/26/2018    Procedure: INSERTION OF  ARTERIOVENOUS (AV) GORE-TEX GRAFT LEFT ARM;  Surgeon: Serafina Mitchell, MD;  Location: [place];  Service: Vascular;  Laterality: Left;  . EYE SURGERY    . I&D EXTREMITY Right 10/31/2017   Procedure: IRRIGATION AND DEBRIDEMENT RIGHT FOOT;  Surgeon: Evelina Bucy, DPM;  Location: [place];  Service: Podiatry;  Laterality: Right;  . I&D EXTREMITY Left 08/20/2018   Procedure: IRRIGATION AND DEBRIDEMENT EXTREMITY WITH SECONDARY WOUND CLOSUREAND APPLICATION OF WOUND VAC LEFT FOOT;  Surgeon: Evelina Bucy, DPM;  Location: [place];  Service: Podiatry;  Laterality: Left;  . I&D EXTREMITY Left 10/20/2018   Procedure: IRRIGATION AND DEBRIDEMENT LEFT FOOT  DEBRIDEMENT LATERAL FOOT WOUND;  Surgeon: Evelina Bucy, DPM;  Location: [place];  Service: Podiatry;  Laterality: Left;  . I&D EXTREMITY Left 10/28/2018   Procedure: IRRIGATION AND DEBRIDEMENT LEFT TOE;  Surgeon: Evelina Bucy, DPM;  Location: [place];  Service: Podiatry;  Laterality: Left;  . INSERTION OF DIALYSIS CATHETER     Right subclavian  . IR AV DIALY SHUNT INTRO NEEDLE/INTRACATH INITIAL W/PTA/IMG RIGHT Right 02/05/2018  . IR THROMBECTOMY AV FISTULA W/THROMBOLYSIS/PTA INC/SHUNT/IMG LEFT Left 08/24/2018  . IR THROMBECTOMY AV FISTULA W/THROMBOLYSIS/PTA INC/SHUNT/IMG LEFT Left 01/06/2019  . IR US GUIDE VASC ACCESS LEFT  08/24/2018  . IR US GUIDE VASC ACCESS RIGHT  02/05/2018  . IRRIGATION AND DEBRIDEMENT FOOT Right 10/23/2018   Procedure: Irrigation and Debridement to tendon, Left Foot;  Surgeon: Evelina Bucy, DPM;  Location: [place];  Service: Podiatry;  Laterality: Right;  . IRRIGATION AND DEBRIDEMENT FOOT Left 11/01/2018   Procedure: IRRIGATION AND DEBRIDEMENT PARTIAL WOUND CLOSURE LOCAL TISSUE TRANSFER AND FLAP ROTATION, LEFT FOOT;  Surgeon: Evelina Bucy, DPM;  Location: [place];  Service: Podiatry;  Laterality: Left;  . TRANSMETATARSAL AMPUTATION N/A 08/18/2018   Procedure: IRRIGATION AND DEBRIDEMENT OF LEFT 5TH TOE AND  TRANSMETATARSAL, WITH PARTICAL LEFT 5TH TOE AND METATARSAL AMPUTATION, BONE BIOPSY, WOUND VAC APPLICATION.;  Surgeon: Evelina Bucy, DPM;  Location: [place];  Service: Podiatry;  Laterality: N/A;  . UPPER EXTREMITY VENOGRAPHY N/A 11/16/2016   Procedure: Upper Extremity Venography - Right Central;  Surgeon: Elam Dutch, MD;  Location: [place] CV LAB;  Service: Cardiovascular;  Laterality: N/A;  . UPPER EXTREMITY VENOGRAPHY N/A 05/25/2018   Procedure: UPPER EXTREMITY VENOGRAPHY - Bilateral;  Surgeon: Marty Heck, MD;  Location: [place] CV LAB;  Service: Cardiovascular;  Laterality: N/A;        Home Medications    Prior to Admission medications   Medication Sig Start Date End Date Taking? Authorizing Provider  cinacalcet (SENSIPAR) 30 MG tablet Take 30 mg by mouth daily with supper.   Yes [provider]  clonazePAM (KLONOPIN) 0.5 MG tablet Take 0.5 mg by mouth daily as needed for anxiety (sleep).  07/21/17  Yes [provider]  collagenase (SANTYL) ointment Apply 1 application topically daily. 08/23/18  Yes Elgergawy, Silver Huguenin, MD  HYDROcodone-acetaminophen (NORCO/VICODIN) 5-325 MG tablet Take 1-2 tablets by mouth every 6 (six) hours as needed for moderate pain or severe pain. 11/10/18  Yes Evelina Bucy, DPM  insulin glargine (LANTUS) 100 UNIT/ML injection Inject 0.15 mLs (15 Units total) into the skin at bedtime. 08/23/18  Yes Elgergawy, Silver Huguenin, MD  lactobacillus acidophilus & bulgar (LACTINEX) chewable tablet Chew 1 tablet by mouth 3 (three) times daily with meals.   Yes [provider]  losartan (COZAAR) 50 MG tablet Take 50 mg by mouth daily.   Yes [provider]  metoCLOPramide (REGLAN) 5 MG tablet Take 5 mg by mouth 4 (four) times daily.   Yes [provider]  multivitamin (RENA-VIT) TABS tablet Take 1 tablet by mouth at bedtime. 10/24/18  Yes Hongalgi, Lenis Dickinson, MD  oxyCODONE (OXY IR/ROXICODONE) 5 MG immediate release  tablet Take 5 mg by mouth every 4 (four) hours as needed for severe pain.   Yes [provider]  promethazine (PHENERGAN) 25 MG tablet Take 25 mg by mouth every 6 (six) hours as needed for nausea or vomiting.   Yes [provider]  sevelamer carbonate (RENVELA) 800 MG tablet Take 2,400-3,200 mg by mouth See admin instructions. Take 3 - 4 tablets (2400 mg -3200 mg) by mouth two times daily with meals   Yes [provider]  sildenafil (VIAGRA) 100 MG tablet Take 100 mg by mouth daily as needed for erectile dysfunction.   Yes [provider]  traMADol Veatrice Bourbon) 50 MG tablet  01/11/19  Yes [provider]  calcitRIOL (ROCALTROL) 0.5 MCG capsule Take 5 capsules (2.5 mcg total) by mouth every Monday, Wednesday, and Friday with hemodialysis. 10/26/18   Modena Jansky, MD  ceFAZolin (ANCEF) 2-4 GM/100ML-% IVPB Inject 100 mLs (2 g total) into the vein every Monday, Wednesday, and Friday with hemodialysis. 03/17/19   Evelina Bucy, DPM  collagenase (SANTYL) ointment Apply 1 application topically daily. Left 1st metatarsal amputation site measuring 3.0 x 3.0 x 0.5cm 01/04/19   Price, Christian Mate, DPM  glucose blood ([employer] TEST STRIPS) test strip by Other route 4 (four) times a day as needed. 10/29/16   [provider]  insulin aspart (NOVOLOG) 100 UNIT/ML injection Inject 6-7 Units into the skin 2 (two) times daily with a meal.     [provider]  ondansetron (ZOFRAN ODT) 4 MG disintegrating tablet Take 1 tablet (4 mg total) by mouth every 8 (eight) hours as needed for nausea or vomiting. 11/23/17   Ward, Ozella Almond, PA-C  silver sulfADIAZINE (SILVADENE) 1 % cream Apply pea-sized amount to wound daily. 04/14/19   Evelina Bucy, DPM    Family History Family History  Problem Relation Age of Onset  . Diabetes Mellitus II Other   . Diabetes Father   . Renal Disease Father        ESRD    Social History Social History   Tobacco Use  . Smoking  status: Never Smoker  . Smokeless tobacco: Never Used  Substance Use Topics  . Alcohol use: No  . Drug use: No     Allergies   Patient has no known allergies.   Review of Systems Review of Systems  Unable to perform ROS: Mental status change     Physical Exam Updated Vital Signs BP 100/84   Pulse 95   Temp 100.2 F (37.9 C) (  Oral)   Resp 11   Ht 5\' 5"  (1.651 m)   Wt 74.8 kg   SpO2 100%   BMI 27.46 kg/m   Physical Exam Vitals signs and nursing note reviewed.  Constitutional:      General: He is not in acute distress.    Appearance: He is well-developed. He is ill-appearing. He is not toxic-appearing or diaphoretic.  HENT:     Head: Atraumatic.  Eyes:     Extraocular Movements: Extraocular movements intact.     Comments: Pupils are 2 mm and equal  Neck:     Musculoskeletal: Neck supple.     Comments: No meningismus Cardiovascular:     Rate and Rhythm: Normal rate.  Pulmonary:     Effort: Pulmonary effort is normal.  Abdominal:     Palpations: Abdomen is soft.  Skin:    General: Skin is warm.  Neurological:     Mental Status: He is disoriented.     Comments: Gross sensory exam is normal.  Patient is moving all 4 extremities      ED Treatments / Results  Labs (all labs ordered are listed, but only abnormal results are displayed) Labs Reviewed  C DIFFICILE QUICK SCREEN W PCR REFLEX - Abnormal; Notable for the following components:      Result Value   C Diff antigen POSITIVE (*)    All other components within normal limits  COMPREHENSIVE METABOLIC PANEL - Abnormal; Notable for the following components:   Sodium 129 (*)    Chloride 86 (*)    CO2 21 (*)    Glucose, Bld 131 (*)    BUN 60 (*)    Creatinine, Ser 13.86 (*)    Albumin 3.3 (*)    Alkaline Phosphatase 182 (*)    Total Bilirubin 1.8 (*)    GFR calc non Af Amer 4 (*)    GFR calc Af Amer 5 (*)    Anion gap 22 (*)    All other components within normal limits  CBC - Abnormal; Notable for  the following components:   WBC 12.7 (*)    Hemoglobin 17.5 (*)    HCT 52.1 (*)    Platelets 135 (*)    All other components within normal limits  C-REACTIVE PROTEIN - Abnormal; Notable for the following components:   CRP 18.8 (*)    All other components within normal limits  PROTIME-INR - Abnormal; Notable for the following components:   Prothrombin Time 15.4 (*)    All other components within normal limits  APTT - Abnormal; Notable for the following components:   aPTT 40 (*)    All other components within normal limits  CBG MONITORING, ED - Abnormal; Notable for the following components:   Glucose-Capillary 126 (*)    All other components within normal limits  POCT I-STAT EG7 - Abnormal; Notable for the following components:   pH, Ven 7.495 (*)    pCO2, Ven 32.0 (*)    pO2, Ven 30.0 (*)    Sodium 128 (*)    Calcium, Ion 0.99 (*)    HCT 56.0 (*)    Hemoglobin 19.0 (*)    All other components within normal limits  SARS CORONAVIRUS 2 (HOSPITAL ORDER, [place] [facility])  CLOSTRIDIUM DIFFICILE BY PCR, REFLEXED  CULTURE, BLOOD (ROUTINE X 2)  CULTURE, BLOOD (ROUTINE X 2)  URINE CULTURE  GASTROINTESTINAL PANEL BY PCR, STOOL (REPLACES STOOL CULTURE)  LACTIC ACID, PLASMA  SEDIMENTATION RATE  LACTIC ACID,  PLASMA  URINALYSIS, ROUTINE W REFLEX MICROSCOPIC  MAGNESIUM  CBG MONITORING, ED  I-STAT VENOUS BLOOD GAS, ED    EKG EKG Interpretation  Date/Time:  Monday April 24 2019 07:12:55 EDT Ventricular Rate:  91 PR Interval:    QRS Duration: 79 QT Interval:  340 QTC Calculation: 419 R Axis:   24 Text Interpretation:  Sinus rhythm Anteroseptal infarct, old ST elevation, consider inferior injury afib is new Nonspecific ST and T wave abnormality Confirmed by Varney Biles [phone]) on 04/24/2019 8:17:36 AM Also confirmed by Varney Biles [phone]), editor Philomena Doheny [phone])  on 04/24/2019 8:19:30 AM   Radiology Ct Head W Or Wo Contrast  Result Date:  04/24/2019 CLINICAL DATA:  38 year old male with confusion, dialysis patient, possible sepsis. EXAM: CT HEAD WITHOUT AND WITH CONTRAST TECHNIQUE: Contiguous axial images were obtained from the base of the skull through the vertex without and with intravenous contrast CONTRAST:  31mL OMNIPAQUE IOHEXOL 300 MG/ML  SOLN COMPARISON:  None. FINDINGS: Brain: Incidental bulky dural calcification along the falx. No midline shift, ventriculomegaly, mass effect, evidence of mass lesion, intracranial hemorrhage or evidence of cortically based acute infarction. Gray-white matter differentiation is within normal limits throughout the brain. No abnormal enhancement identified. Vascular: Calcified atherosclerosis at the skull base. The major intracranial vascular structures appear to enhance as expected. Skull: Negative. Sinuses/Orbits: Partially visible left maxillary retention cyst, otherwise clear. Other: Calcified scalp vessel atherosclerosis. No acute orbit or scalp soft tissue finding. IMPRESSION: 1. Normal for age CT appearance of the brain. 2. Calcified scalp vessel atherosclerosis, frequently seen in in stage renal disease/dialysis. Electronically Signed   By: Genevie Ann M.D.   On: 04/24/2019 11:05   Dg Chest Port 1 View  Result Date: 04/24/2019 CLINICAL DATA:  38 year old male dialysis patient with confusion, possible sepsis. EXAM: PORTABLE CHEST 1 VIEW COMPARISON:  10/20/2018 and earlier. FINDINGS: Portable AP semi upright view at 0813 hours. Lung volumes and mediastinal contours remain within normal limits. Visualized tracheal air column is within normal limits. Allowing for portable technique the lungs are clear. Stable right superior mediastinum vascular stent. Increased vascular stents in left axilla since January. Stable right axillary vascular stent. No osseous abnormality identified. Negative visible bowel gas pattern. IMPRESSION: No acute cardiopulmonary abnormality. Electronically Signed   By: Genevie Ann M.D.    On: 04/24/2019 08:28    Procedures .Critical Care Performed by: Varney Biles, MD Authorized by: Varney Biles, MD   Critical care provider statement:    Critical care time (minutes):  45   Critical care was necessary to treat or prevent imminent or life-threatening deterioration of the following conditions:  CNS failure or compromise and sepsis   Critical care was time spent personally by me on the following activities:  Discussions with consultants, evaluation of patient's response to treatment, examination of patient, ordering and performing treatments and interventions, ordering and review of laboratory studies, ordering and review of radiographic studies, pulse oximetry, re-evaluation of patient's condition, obtaining history from patient or surrogate and review of old charts   (including critical care time)  Medications Ordered in ED Medications  sodium chloride flush (NS) 0.9 % injection 3 mL (has no administration in time range)  vancomycin (VANCOCIN) 1,750 mg in sodium chloride 0.9 % 500 mL IVPB (1,750 mg Intravenous New Bag/Given 04/24/19 1009)  acyclovir (ZOVIRAX) 375 mg in dextrose 5 % 100 mL IVPB (375 mg Intravenous New Bag/Given 04/24/19 1125)  acyclovir (ZOVIRAX) 375 mg in dextrose 5 % 100 mL IVPB (has no  administration in time range)  vancomycin variable dose per unstable renal function (pharmacist dosing) (has no administration in time range)  cefTRIAXone (ROCEPHIN) 2 g in sodium chloride 0.9 % 100 mL IVPB (0 g Intravenous Stopped 04/24/19 1007)  sodium chloride 0.9 % bolus 1,000 mL ( Intravenous Rate/Dose Change 04/24/19 1128)  iohexol (OMNIPAQUE) 300 MG/ML solution 75 mL (75 mLs Intravenous Contrast Given 04/24/19 1045)  fentaNYL (SUBLIMAZE) injection 50 mcg (50 mcg Intravenous Given 04/24/19 1114)     Initial Impression / Assessment and Plan / ED Course  I have reviewed the triage vital signs and the nursing notes.  Pertinent labs & imaging results that were  available during my care of the patient were reviewed by me and considered in my medical decision making (see chart for details).  Clinical Course as of Apr 23 1132  Mon Apr 24, 2019  1127 I called patient's mother at [phone]. Ms. Rodena Piety reports that she has been out of town and patient called her yesterday, emotional about his diarrhea.  He had informed her that if he does not get better today than he was going to come to the ER after dialysis.  She informs me that confusion is not normal for the patient.  I reassessed the patient.  He is now oriented x3.  He is aware that he is in the hospital and that ambulance brought him here.  He also informs me that he has been having nausea, vomiting for the last several days and not getting better.  He has had DKA in the past with nausea, vomiting.  He was unaware of any fevers.  He denies any sick contacts.  He is on Ancef right now.  At this time I do not think patient needs a lumbar puncture for encephalopathy work-up.  We have added GI pathogen study, and will proceed with admission.   [AN]  1131 pH is 7.495.  Patient does have an anion gap of 22, but bicarb is only minimally low at 21 and lactic acid levels are within normal limits.  Blood sugar is 131.  He is not in DKA.  Anion gap could be because of ketosis from dehydration.  We have ordered 1 L of IV fluid here.    Anion gap(!): 22 [AN]    Clinical Course User Index [AN] Varney Biles, MD       38 year old comes in a chief complaint of altered mental status.  He has history of diabetes, ESRD and is being treated for soft tissue infection at this time.  There is no meningismus on exam.  Patient is disoriented.  There is no signs of trauma.  Gross neuro exam which is significantly limited does not have any focal findings.  DDx includes: ICH / Stroke Sepsis syndrome Infection - UTI/Pneumonia/brain abscess Encephalopathy (toxic versus metabolic versus infection) Electrolyte  abnormality Drug overdose DKA Metabolic disorders including thyroid disorders, adrenal insufficiency Hypercapnia / COPD Hypoxia  Plans to get CT head with contrast.  We will give him 2 mg of ceftriaxone along with vancomycin.  Code sepsis activated.  There is no signs of volume overload.  Electrolytes not showing any hyperkalemia and EKG is fine.  DKA essentially ruled out based on the CMP. Nephrology team has been alerted.  11:34 AM Although there is slightly elevated LFTs, abdominal exam is still not peritoneal.  No CT indicated.   Final Clinical Impressions(s) / ED Diagnoses   Final diagnoses:  Disorientation  Encephalopathy acute  Diarrhea, unspecified type  ESRD  on dialysis [facility])    ED Discharge Orders    None       Varney Biles, MD 04/24/19 1134

## 2019-04-24 NOTE — Progress Notes (Addendum)
Pharmacy Antibiotic Note  [name] is a 38 y.o. male admitted on 04/24/2019 with sepsis, r/o meningitis.  Pharmacy has been consulted for vancomycin/acyclovir dosing. Also has ceftriaxone ordered x 1 dose per EDP. ESRD on HD MWF - transported from HD today to [facility], unclear how much of session he tolerated.  Plan: Vancomycin 1750mg  IV x 1; then 750mg  IV qHD MWF Acyclovir 375mg  (5mg /kg) IV q24h Ceftriaxone 2g IV x 1 per EDP - f/u if needs to continue Monitor clinical progress, c/s, abx plan/LOT Pre-HD vancomycin level as indicated F/u Nephrology plans, HD schedule/tolerance inpatient to enter vancomycin maintenance doses   Height: 5\' 5"  (165.1 cm) Weight: 165 lb (74.8 kg) IBW/kg (Calculated) : 61.5  Temp (24hrs), Avg:100.2 F (37.9 C), Min:100.2 F (37.9 C), Max:100.2 F (37.9 C)  No results for input(s): WBC, CREATININE, LATICACIDVEN, VANCOTROUGH, VANCOPEAK, VANCORANDOM, GENTTROUGH, GENTPEAK, GENTRANDOM, TOBRATROUGH, TOBRAPEAK, TOBRARND, AMIKACINPEAK, AMIKACINTROU, AMIKACIN in the last 168 hours.  CrCl cannot be calculated (Patient's most recent lab result is older than the maximum 21 days allowed.).    No Known Allergies  Elicia Lamp, PharmD, BCPS Please check AMION for all [place] contact numbers Clinical Pharmacist 04/24/2019 8:11 AM

## 2019-04-24 NOTE — Consult Note (Signed)
[facility] Renal Consultation Note    Indication for Consultation:  Management of ESRD/hemodialysis, anemia, hypertension/volume, and secondary hyperparathyroidism.  HPI: [name] is a 39 y.o. male with a history of ESRD on dialysis, gastroparesis, T2DM, HTN and R fifth metarasal amputation, who presented to the ED today from dialysis. According to the dialysis nurse, he was complaining of abdominal pain and only completed a few minutes of treatment before requesting to get off. On presentation to the ED, patient was confused and not oriented to situation and said he was "going to the ball game." His nurse reports his mental status has improved. He also reportedly is on ancef for a diabetic foot ulcer. On presentation to the ED, he was febrile to 100.2, mildly tachycardic and normotensive. Hgb 19.0, K+ 4.1, Na 129, Ca 9.5, WBC 12.7, BUN 60.  CT of the head without acute changes. Chest x-ray without acute cardiopulmonary abnormality.He did have an anion gap of 22, but bicarb is only minimally low at 21 and lactic acid levels are within normal limits. According to his nurse, he did receive 511ml for low BP already. He was started on vancomycin, ceftriaxone and acyclovir.   On exam, patient is alert and oriented x 3. Patient reports he has had diarrhea for about 3 days. He does report abdominal pain and requests more pain medicine. Denies nausea, vomiting, fever, and chills. Denies SOB, dyspnea, cough, CP. He does not recall being confused earlier today. He reports his AVF has clotted during dialysis several times recently.   Past Medical History:  Diagnosis Date  . Diabetes mellitus without complication ([place])   . Diabetic gastroparesis ([place])   . Dialysis patient ([place])   . Hypertension   . Renal disorder    Dialysis  . Sepsis [facility])    Past Surgical History:  Procedure Laterality Date  . AMPUTATION Right 02/02/2018   Procedure: RIGHT FIFTH TOE AND METATARSAL AMPUTATION. Filetted  toe flap metatarsal resection. Debridement Plantar Foot wound;  Surgeon: Evelina Bucy, DPM;  Location: [place];  Service: Podiatry;  Laterality: Right;  . AMPUTATION Left 08/20/2018   Procedure: FIFTH METATARSAL BONE BIOPSY;  Surgeon: Evelina Bucy, DPM;  Location: [place];  Service: Podiatry;  Laterality: Left;  . AMPUTATION Left 10/28/2018   Procedure: LEFT GREAT TOE AMPUTATION;  Surgeon: Evelina Bucy, DPM;  Location: [place];  Service: Podiatry;  Laterality: Left;  . APPLICATION OF WOUND VAC  02/02/2018   Procedure: APPLICATION OF WOUND VAC  Right Foot;  Surgeon: Evelina Bucy, DPM;  Location: [place];  Service: Podiatry;;  . APPLICATION OF WOUND VAC Left 10/28/2018   Procedure: APPLICATION OF WOUND VAC LEFT TOE;  Surgeon: Evelina Bucy, DPM;  Location: [place];  Service: Podiatry;  Laterality: Left;  . APPLICATION OF WOUND VAC Left 11/01/2018   Procedure: APPLICATION OF WOUND VAC;  Surgeon: Evelina Bucy, DPM;  Location: [place];  Service: Podiatry;  Laterality: Left;  . AV FISTULA PLACEMENT     left arm.  . AV FISTULA PLACEMENT Right 12/22/2016   Procedure: INSERTION OF ARTERIOVENOUS (AV) GORE-TEX GRAFT ARM;  Surgeon: Elam Dutch, MD;  Location: [facility] OR;  Service: Vascular;  Laterality: Right;  . AV FISTULA PLACEMENT Left 05/26/2018   Procedure: INSERTION OF  ARTERIOVENOUS (AV) GORE-TEX GRAFT LEFT ARM;  Surgeon: Serafina Mitchell, MD;  Location: [place];  Service: Vascular;  Laterality: Left;  . EYE SURGERY    . I&D EXTREMITY Right 10/31/2017  Procedure: IRRIGATION AND DEBRIDEMENT RIGHT FOOT;  Surgeon: Evelina Bucy, DPM;  Location: [place];  Service: Podiatry;  Laterality: Right;  . I&D EXTREMITY Left 08/20/2018   Procedure: IRRIGATION AND DEBRIDEMENT EXTREMITY WITH SECONDARY WOUND CLOSUREAND APPLICATION OF WOUND VAC LEFT FOOT;  Surgeon: Evelina Bucy, DPM;  Location: [place];  Service: Podiatry;  Laterality: Left;  . I&D EXTREMITY Left 10/20/2018   Procedure: IRRIGATION AND DEBRIDEMENT  LEFT FOOT  DEBRIDEMENT LATERAL FOOT WOUND;  Surgeon: Evelina Bucy, DPM;  Location: [place];  Service: Podiatry;  Laterality: Left;  . I&D EXTREMITY Left 10/28/2018   Procedure: IRRIGATION AND DEBRIDEMENT LEFT TOE;  Surgeon: Evelina Bucy, DPM;  Location: [place];  Service: Podiatry;  Laterality: Left;  . INSERTION OF DIALYSIS CATHETER     Right subclavian  . IR AV DIALY SHUNT INTRO NEEDLE/INTRACATH INITIAL W/PTA/IMG RIGHT Right 02/05/2018  . IR THROMBECTOMY AV FISTULA W/THROMBOLYSIS/PTA INC/SHUNT/IMG LEFT Left 08/24/2018  . IR THROMBECTOMY AV FISTULA W/THROMBOLYSIS/PTA INC/SHUNT/IMG LEFT Left 01/06/2019  . IR US GUIDE VASC ACCESS LEFT  08/24/2018  . IR US GUIDE VASC ACCESS RIGHT  02/05/2018  . IRRIGATION AND DEBRIDEMENT FOOT Right 10/23/2018   Procedure: Irrigation and Debridement to tendon, Left Foot;  Surgeon: Evelina Bucy, DPM;  Location: [place];  Service: Podiatry;  Laterality: Right;  . IRRIGATION AND DEBRIDEMENT FOOT Left 11/01/2018   Procedure: IRRIGATION AND DEBRIDEMENT PARTIAL WOUND CLOSURE LOCAL TISSUE TRANSFER AND FLAP ROTATION, LEFT FOOT;  Surgeon: Evelina Bucy, DPM;  Location: [place];  Service: Podiatry;  Laterality: Left;  . TRANSMETATARSAL AMPUTATION N/A 08/18/2018   Procedure: IRRIGATION AND DEBRIDEMENT OF LEFT 5TH TOE AND TRANSMETATARSAL, WITH PARTICAL LEFT 5TH TOE AND METATARSAL AMPUTATION, BONE BIOPSY, WOUND VAC APPLICATION.;  Surgeon: Evelina Bucy, DPM;  Location: [place];  Service: Podiatry;  Laterality: N/A;  . UPPER EXTREMITY VENOGRAPHY N/A 11/16/2016   Procedure: Upper Extremity Venography - Right Central;  Surgeon: Elam Dutch, MD;  Location: [place] [facility];  Service: Cardiovascular;  Laterality: N/A;  . UPPER EXTREMITY VENOGRAPHY N/A 05/25/2018   Procedure: UPPER EXTREMITY VENOGRAPHY - Bilateral;  Surgeon: Marty Heck, MD;  Location: [place] [facility];  Service: Cardiovascular;  Laterality: N/A;   Family History  Problem Relation Age of Onset  .  Diabetes Mellitus II Other   . Diabetes Father   . Renal Disease Father        ESRD   Social History:  reports that he has never smoked. He has never used smokeless tobacco. He reports that he does not drink alcohol or use drugs.  ROS: As per HPI otherwise negative.  Physical Exam: Vitals:   04/24/19 1300 04/24/19 1315 04/24/19 1328 04/24/19 1330  BP: (!) 140/47 (!) 133/54  (!) 153/88  Pulse: 100 (!) 101  (!) 105  Resp:  20  (!) 31  Temp:   99.6 F (37.6 C)   TempSrc:   Oral   SpO2: 94% 96%  100%  Weight:      Height:         General: Well developed, chronically ill appearing male. Somewhat somnolent but in no acute distress. Head: Normocephalic, atraumatic, sclera non-icteric, mucus membranes are moist. Neck: JVD not elevated. Lungs: Clear bilaterally to auscultation without wheezes, rales, or rhonchi. Breathing is unlabored. Heart: RRR with normal S1, S2. No murmurs, rubs, or gallops appreciated. Abdomen: Soft, diffusely tender, non-distended with normoactive bowel sounds. No rebound/guarding. No obvious abdominal masses. Musculoskeletal:  Strength and  tone appear normal for age. Lower extremities: No edema of bilateral lower extremities. S/p R fifth metatarsal amputation. Chronic ulcer.  Neuro: Alert and oriented X 3. Moves all extremities spontaneously. Psych:  Responds to questions appropriately with a normal affect. Dialysis Access: LUE AVG + thrill  No Known Allergies Prior to Admission medications   Medication Sig Start Date End Date Taking? Authorizing Provider  Amino Acids-Protein Hydrolys (FEEDING SUPPLEMENT, PRO-STAT SUGAR FREE 64,) LIQD Take 30 mLs by mouth 2 (two) times a day.   Yes [provider]  aspirin 325 MG tablet Take 325 mg by mouth at bedtime.   Yes [provider]  cinacalcet (SENSIPAR) 30 MG tablet Take 30 mg by mouth daily with supper.   Yes [provider]  collagenase (SANTYL) ointment Apply 1 application topically  daily. 08/23/18  Yes Elgergawy, Silver Huguenin, MD  insulin aspart (NOVOLOG) 100 UNIT/ML injection Inject 6-7 Units into the skin 2 (two) times daily with a meal.    Yes [provider]  insulin glargine (LANTUS) 100 UNIT/ML injection Inject 0.15 mLs (15 Units total) into the skin at bedtime. 08/23/18  Yes Elgergawy, Silver Huguenin, MD  lactobacillus acidophilus & bulgar (LACTINEX) chewable tablet Chew 1 tablet by mouth 3 (three) times daily with meals.   Yes [provider]  losartan (COZAAR) 50 MG tablet Take 50 mg by mouth daily.   Yes [provider]  metoCLOPramide (REGLAN) 5 MG tablet Take 5 mg by mouth 3 (three) times daily before meals.    Yes [provider]  multivitamin (RENA-VIT) TABS tablet Take 1 tablet by mouth at bedtime. 10/24/18  Yes Hongalgi, Lenis Dickinson, MD  promethazine (PHENERGAN) 25 MG tablet Take 25 mg by mouth every 6 (six) hours as needed for nausea or vomiting.   Yes [provider]  sevelamer carbonate (RENVELA) 800 MG tablet Take 2,400 mg by mouth See admin instructions. Take 5 times a day and two times a day with snacks   Yes [provider]  calcitRIOL (ROCALTROL) 0.5 MCG capsule Take 5 capsules (2.5 mcg total) by mouth every Monday, Wednesday, and Friday with hemodialysis. Patient not taking: Reported on 04/24/2019 10/26/18   Modena Jansky, MD  ceFAZolin (ANCEF) 2-4 GM/100ML-% IVPB Inject 100 mLs (2 g total) into the vein every Monday, Wednesday, and Friday with hemodialysis. Patient not taking: Reported on 04/24/2019 03/17/19   Evelina Bucy, DPM  clonazePAM (KLONOPIN) 0.5 MG tablet Take 0.5 mg by mouth at bedtime.  07/21/17   [provider]  collagenase (SANTYL) ointment Apply 1 application topically daily. Left 1st metatarsal amputation site measuring 3.0 x 3.0 x 0.5cm Patient not taking: Reported on 04/24/2019 01/04/19   Evelina Bucy, DPM  glucose blood ([employer] TEST STRIPS) test strip by Other route 4 (four) times  a day as needed. 10/29/16   [provider]  HYDROcodone-acetaminophen (NORCO/VICODIN) 5-325 MG tablet Take 1-2 tablets by mouth every 6 (six) hours as needed for moderate pain or severe pain. Patient not taking: Reported on 04/24/2019 11/10/18   Evelina Bucy, DPM  ondansetron (ZOFRAN ODT) 4 MG disintegrating tablet Take 1 tablet (4 mg total) by mouth every 8 (eight) hours as needed for nausea or vomiting. Patient not taking: Reported on 04/24/2019 11/23/17   Ward, Ozella Almond, PA-C  oxyCODONE (OXY IR/ROXICODONE) 5 MG immediate release tablet Take 5 mg by mouth every 4 (four) hours as needed for severe pain.    [provider]  sildenafil (VIAGRA)  100 MG tablet Take 100 mg by mouth daily as needed for erectile dysfunction.    [provider]  silver sulfADIAZINE (SILVADENE) 1 % cream Apply pea-sized amount to wound daily. Patient not taking: Reported on 04/24/2019 04/14/19   Evelina Bucy, DPM  traMADol (ULTRAM) 50 MG tablet Take 50 mg by mouth 2 (two) times daily.  01/11/19   [provider]   Current Facility-Administered Medications  Medication Dose Route Frequency Provider Last Rate Last Dose  . acetaminophen (TYLENOL) tablet 650 mg  650 mg Oral Q6H PRN Chundi, Vahini, MD       Or  . acetaminophen (TYLENOL) suppository 650 mg  650 mg Rectal Q6H PRN Chundi, Vahini, MD      . Derrill Memo ON 04/25/2019] acyclovir (ZOVIRAX) 375 mg in dextrose 5 % 100 mL IVPB  5 mg/kg Intravenous Q24H Romona Curls, [facility]      . fentaNYL (DURAGESIC) 25 MCG/HR 1 patch  1 patch Transdermal Q72H Chundi, Vahini, MD      . heparin injection 5,000 Units  5,000 Units Subcutaneous Q8H Chundi, Vahini, MD      . promethazine (PHENERGAN) tablet 12.5 mg  12.5 mg Oral Q6H PRN Chundi, Vahini, MD      . senna-docusate (Senokot-S) tablet 1 tablet  1 tablet Oral QHS PRN Chundi, Vahini, MD      . sodium chloride flush (NS) 0.9 % injection 3 mL  3 mL Intravenous Once Varney Biles, MD      . Derrill Memo  ON 04/25/2019] vancomycin variable dose per unstable renal function (pharmacist dosing)   Does not apply See admin instructions Romona Curls, [facility]       Current Outpatient Medications  Medication Sig Dispense Refill  . Amino Acids-Protein Hydrolys (FEEDING SUPPLEMENT, PRO-STAT SUGAR FREE 64,) LIQD Take 30 mLs by mouth 2 (two) times a day.    Marland Kitchen aspirin 325 MG tablet Take 325 mg by mouth at bedtime.    . cinacalcet (SENSIPAR) 30 MG tablet Take 30 mg by mouth daily with supper.    . collagenase (SANTYL) ointment Apply 1 application topically daily. 15 g 0  . insulin aspart (NOVOLOG) 100 UNIT/ML injection Inject 6-7 Units into the skin 2 (two) times daily with a meal.     . insulin glargine (LANTUS) 100 UNIT/ML injection Inject 0.15 mLs (15 Units total) into the skin at bedtime. 10 mL 1  . lactobacillus acidophilus & bulgar (LACTINEX) chewable tablet Chew 1 tablet by mouth 3 (three) times daily with meals.    Marland Kitchen losartan (COZAAR) 50 MG tablet Take 50 mg by mouth daily.    . metoCLOPramide (REGLAN) 5 MG tablet Take 5 mg by mouth 3 (three) times daily before meals.     . multivitamin (RENA-VIT) TABS tablet Take 1 tablet by mouth at bedtime. 30 tablet 0  . promethazine (PHENERGAN) 25 MG tablet Take 25 mg by mouth every 6 (six) hours as needed for nausea or vomiting.    . sevelamer carbonate (RENVELA) 800 MG tablet Take 2,400 mg by mouth See admin instructions. Take 5 times a day and two times a day with snacks    . calcitRIOL (ROCALTROL) 0.5 MCG capsule Take 5 capsules (2.5 mcg total) by mouth every Monday, Wednesday, and Friday with hemodialysis. (Patient not taking: Reported on 04/24/2019) 30 capsule 0  . ceFAZolin (ANCEF) 2-4 GM/100ML-% IVPB Inject 100 mLs (2 g total) into the vein every Monday, Wednesday, and Friday with hemodialysis. (Patient not taking: Reported on 04/24/2019)  1 each 7  . clonazePAM (KLONOPIN) 0.5 MG tablet Take 0.5 mg by mouth at bedtime.     . collagenase (SANTYL) ointment Apply 1  application topically daily. Left 1st metatarsal amputation site measuring 3.0 x 3.0 x 0.5cm (Patient not taking: Reported on 04/24/2019) 15 g 5  . glucose blood ([employer] TEST STRIPS) test strip by Other route 4 (four) times a day as needed.    Marland Kitchen HYDROcodone-acetaminophen (NORCO/VICODIN) 5-325 MG tablet Take 1-2 tablets by mouth every 6 (six) hours as needed for moderate pain or severe pain. (Patient not taking: Reported on 04/24/2019) 20 tablet 0  . ondansetron (ZOFRAN ODT) 4 MG disintegrating tablet Take 1 tablet (4 mg total) by mouth every 8 (eight) hours as needed for nausea or vomiting. (Patient not taking: Reported on 04/24/2019) 10 tablet 0  . oxyCODONE (OXY IR/ROXICODONE) 5 MG immediate release tablet Take 5 mg by mouth every 4 (four) hours as needed for severe pain.    . sildenafil (VIAGRA) 100 MG tablet Take 100 mg by mouth daily as needed for erectile dysfunction.    . silver sulfADIAZINE (SILVADENE) 1 % cream Apply pea-sized amount to wound daily. (Patient not taking: Reported on 04/24/2019) 50 g 0  . traMADol (ULTRAM) 50 MG tablet Take 50 mg by mouth 2 (two) times daily.      Labs: Basic Metabolic Panel: Recent Labs  Lab 04/24/19 0726 04/24/19 0814  NA 129* 128*  K 4.2 4.1  CL 86*  --   CO2 21*  --   GLUCOSE 131*  --   BUN 60*  --   CREATININE 13.86*  --   CALCIUM 9.5  --    Liver Function Tests: Recent Labs  Lab 04/24/19 0726  AST 19  ALT 13  ALKPHOS 182*  BILITOT 1.8*  PROT 7.7  ALBUMIN 3.3*   CBC: Recent Labs  Lab 04/24/19 0726 04/24/19 0814  WBC 12.7*  --   HGB 17.5* 19.0*  HCT 52.1* 56.0*  MCV 91.6  --   PLT 135*  --    CBG: Recent Labs  Lab 04/24/19 0711  GLUCAP 126*   Studies/Results: Ct Head W Or Wo Contrast  Result Date: 04/24/2019 CLINICAL DATA:  38 year old male with confusion, dialysis patient, possible sepsis. EXAM: CT HEAD WITHOUT AND WITH CONTRAST TECHNIQUE: Contiguous axial images were obtained from the base of the skull through the  vertex without and with intravenous contrast CONTRAST:  78mL OMNIPAQUE IOHEXOL 300 MG/ML  SOLN COMPARISON:  None. FINDINGS: Brain: Incidental bulky dural calcification along the falx. No midline shift, ventriculomegaly, mass effect, evidence of mass lesion, intracranial hemorrhage or evidence of cortically based acute infarction. Gray-white matter differentiation is within normal limits throughout the brain. No abnormal enhancement identified. Vascular: Calcified atherosclerosis at the skull base. The major intracranial vascular structures appear to enhance as expected. Skull: Negative. Sinuses/Orbits: Partially visible left maxillary retention cyst, otherwise clear. Other: Calcified scalp vessel atherosclerosis. No acute orbit or scalp soft tissue finding. IMPRESSION: 1. Normal for age CT appearance of the brain. 2. Calcified scalp vessel atherosclerosis, frequently seen in in stage renal disease/dialysis. Electronically Signed   By: Genevie Ann M.D.   On: 04/24/2019 11:05   Dg Chest Port 1 View  Result Date: 04/24/2019 CLINICAL DATA:  38 year old male dialysis patient with confusion, possible sepsis. EXAM: PORTABLE CHEST 1 VIEW COMPARISON:  10/20/2018 and earlier. FINDINGS: Portable AP semi upright view at 0813 hours. Lung volumes and mediastinal contours remain within normal limits. Visualized tracheal  air column is within normal limits. Allowing for portable technique the lungs are clear. Stable right superior mediastinum vascular stent. Increased vascular stents in left axilla since January. Stable right axillary vascular stent. No osseous abnormality identified. Negative visible bowel gas pattern. IMPRESSION: No acute cardiopulmonary abnormality. Electronically Signed   By: Genevie Ann M.D.   On: 04/24/2019 08:28    Dialysis Orders: Center: [facility]  on MWF. Time: 4 hours. BFR 400/ DFR 800. EDW 75kg; 2K/2Ca Heparin 5000 unit bolus plus 5000 unit bolus mid run Calcitriol 0.25 mcg PO TIW during  dialysis Sevelamer carbonate 800mg  5 tabs PO BID Sensipar 60mg  1 tab PO QD  Assessment/Plan: 1. Confusion: Unclear etiology. CT head without acute intracranial abnormalities. Patient was alert and oriented x 3 at the time of my exam. BUN not significantly elevated from baseline. Per primary.  2. Fever: Temp 100.75F on presentation. Does have diabetic foot ulcer. On vancomycin, ceftriaxone and acyclovir. Per primary.  3.  Diarrhea: Reports diarrhea for 3 days.  COVID test negative in the ED. Per primary.  4. Hyponatremia: Possibly related to GI losses and ESRD.  5.  ESRD:  Normally dialyzes MWF, only got a few minutes of dialysis today. No evidence of volume overload, K+ controlled. Will likely need to dialyze tomorrow due to high inpatient census today. 6.  Hypertension/volume: BP slightly elevated. Continue home meds. Not volume overloaded on exam today, currently slightly below his dry weight. Will plan for 1L UF in HD tomorrow. 7.  Anemia: Hgb 17.5, 19.0 on repeat. Not on ESA or Fe. Has been trending upward over the past several month at outpatient HD unit (was around 12 in March).  8.  Metabolic bone disease: Calcium 9.5. Will continue sensipar and calcitriol. Phosphorus pending. Last outpatient phosphorus 6.3, continue home binder. 9.  Nutrition:  Renal diet recommended 10. T2DM: On insulin.   Anice Paganini, PA-C 04/24/2019, 2:02 PM  [facility] Pager: [phone]

## 2019-04-24 NOTE — ED Notes (Signed)
ED TO INPATIENT HANDOFF REPORT  ED Nurse Name and Phone #: [facility] [phone]  S Name/Age/Gender [name] 38 y.o. male Room/Bed: 023C/023C  Code Status   Code Status: Full Code  Home/SNF/Other Home Patient oriented to: self, place, time and situation Is this baseline? Yes   Triage Complete: Triage complete  Chief Complaint n/v  Triage Note Pt arrives [facility] from dialysis with confusion and staff c/o N/V. Pt makes comments on arrival about "going to the game." Pt drove to dialysis. Pt told dialysis staff he had not eaten or drank anything for 3 days due to N/;V. Dressing noted to left upper arm   Allergies No Known Allergies  Level of Care/Admitting Diagnosis ED Disposition    ED Disposition Condition Comment   Admit  Hospital Area: [facility] [[id number]]  Level of Care: Telemetry Medical [104]  Covid Evaluation: Confirmed COVID Negative  Diagnosis: Diarrhea [787.91.ICD-9-CM]  Admitting Physician: Oda Kilts [[id number]]  Attending Physician: Oda Kilts [[id number]]  Estimated length of stay: past midnight tomorrow  Certification:: I certify this patient will need inpatient services for at least 2 midnights  PT Class (Do Not Modify): Inpatient [101]  PT Acc Code (Do Not Modify): Private [1]       B Medical/Surgery History Past Medical History:  Diagnosis Date  . Diabetes mellitus without complication ([place])   . Diabetic gastroparesis ([place])   . Dialysis patient ([place])   . Hypertension   . Renal disorder    Dialysis  . Sepsis [facility])    Past Surgical History:  Procedure Laterality Date  . AMPUTATION Right 02/02/2018   Procedure: RIGHT FIFTH TOE AND METATARSAL AMPUTATION. Filetted toe flap metatarsal resection. Debridement Plantar Foot wound;  Surgeon: Evelina Bucy, DPM;  Location: [place];  Service: Podiatry;  Laterality: Right;  . AMPUTATION Left 08/20/2018   Procedure: FIFTH METATARSAL BONE BIOPSY;  Surgeon: Evelina Bucy, DPM;   Location: [place];  Service: Podiatry;  Laterality: Left;  . AMPUTATION Left 10/28/2018   Procedure: LEFT GREAT TOE AMPUTATION;  Surgeon: Evelina Bucy, DPM;  Location: [place];  Service: Podiatry;  Laterality: Left;  . APPLICATION OF WOUND VAC  02/02/2018   Procedure: APPLICATION OF WOUND VAC  Right Foot;  Surgeon: Evelina Bucy, DPM;  Location: [place];  Service: Podiatry;;  . APPLICATION OF WOUND VAC Left 10/28/2018   Procedure: APPLICATION OF WOUND VAC LEFT TOE;  Surgeon: Evelina Bucy, DPM;  Location: [place];  Service: Podiatry;  Laterality: Left;  . APPLICATION OF WOUND VAC Left 11/01/2018   Procedure: APPLICATION OF WOUND VAC;  Surgeon: Evelina Bucy, DPM;  Location: [place];  Service: Podiatry;  Laterality: Left;  . AV FISTULA PLACEMENT     left arm.  . AV FISTULA PLACEMENT Right 12/22/2016   Procedure: INSERTION OF ARTERIOVENOUS (AV) GORE-TEX GRAFT ARM;  Surgeon: Elam Dutch, MD;  Location: [facility] OR;  Service: Vascular;  Laterality: Right;  . AV FISTULA PLACEMENT Left 05/26/2018   Procedure: INSERTION OF  ARTERIOVENOUS (AV) GORE-TEX GRAFT LEFT ARM;  Surgeon: Serafina Mitchell, MD;  Location: [place];  Service: Vascular;  Laterality: Left;  . EYE SURGERY    . I&D EXTREMITY Right 10/31/2017   Procedure: IRRIGATION AND DEBRIDEMENT RIGHT FOOT;  Surgeon: Evelina Bucy, DPM;  Location: [place];  Service: Podiatry;  Laterality: Right;  . I&D EXTREMITY Left 08/20/2018   Procedure: IRRIGATION AND DEBRIDEMENT EXTREMITY WITH SECONDARY WOUND CLOSUREAND APPLICATION OF WOUND  VAC LEFT FOOT;  Surgeon: Evelina Bucy, DPM;  Location: [place];  Service: Podiatry;  Laterality: Left;  . I&D EXTREMITY Left 10/20/2018   Procedure: IRRIGATION AND DEBRIDEMENT LEFT FOOT  DEBRIDEMENT LATERAL FOOT WOUND;  Surgeon: Evelina Bucy, DPM;  Location: [place];  Service: Podiatry;  Laterality: Left;  . I&D EXTREMITY Left 10/28/2018   Procedure: IRRIGATION AND DEBRIDEMENT LEFT TOE;  Surgeon: Evelina Bucy, DPM;  Location: [place];  Service: Podiatry;  Laterality: Left;  . INSERTION OF DIALYSIS CATHETER     Right subclavian  . IR AV DIALY SHUNT INTRO NEEDLE/INTRACATH INITIAL W/PTA/IMG RIGHT Right 02/05/2018  . IR THROMBECTOMY AV FISTULA W/THROMBOLYSIS/PTA INC/SHUNT/IMG LEFT Left 08/24/2018  . IR THROMBECTOMY AV FISTULA W/THROMBOLYSIS/PTA INC/SHUNT/IMG LEFT Left 01/06/2019  . IR US GUIDE VASC ACCESS LEFT  08/24/2018  . IR US GUIDE VASC ACCESS RIGHT  02/05/2018  . IRRIGATION AND DEBRIDEMENT FOOT Right 10/23/2018   Procedure: Irrigation and Debridement to tendon, Left Foot;  Surgeon: Evelina Bucy, DPM;  Location: [place];  Service: Podiatry;  Laterality: Right;  . IRRIGATION AND DEBRIDEMENT FOOT Left 11/01/2018   Procedure: IRRIGATION AND DEBRIDEMENT PARTIAL WOUND CLOSURE LOCAL TISSUE TRANSFER AND FLAP ROTATION, LEFT FOOT;  Surgeon: Evelina Bucy, DPM;  Location: [place];  Service: Podiatry;  Laterality: Left;  . TRANSMETATARSAL AMPUTATION N/A 08/18/2018   Procedure: IRRIGATION AND DEBRIDEMENT OF LEFT 5TH TOE AND TRANSMETATARSAL, WITH PARTICAL LEFT 5TH TOE AND METATARSAL AMPUTATION, BONE BIOPSY, WOUND VAC APPLICATION.;  Surgeon: Evelina Bucy, DPM;  Location: [place];  Service: Podiatry;  Laterality: N/A;  . UPPER EXTREMITY VENOGRAPHY N/A 11/16/2016   Procedure: Upper Extremity Venography - Right Central;  Surgeon: Elam Dutch, MD;  Location: [place] [facility];  Service: Cardiovascular;  Laterality: N/A;  . UPPER EXTREMITY VENOGRAPHY N/A 05/25/2018   Procedure: UPPER EXTREMITY VENOGRAPHY - Bilateral;  Surgeon: Marty Heck, MD;  Location: [place] [facility];  Service: Cardiovascular;  Laterality: N/A;     A IV Location/Drains/Wounds Patient Lines/Drains/Airways Status   Active Line/Drains/Airways    Name:   Placement date:   Placement time:   Site:   Days:   Peripheral IV 10/29/18 Anterior;Right;Upper Arm   10/29/18    -    Arm   177   Peripheral IV 04/24/19 Upper;Right Arm   04/24/19    0917    Arm   less  than 1   Fistula / Graft Left Upper arm Arteriovenous vein graft   05/26/18    0828    Upper arm   333   Wound / Incision (Open or Dehisced) 10/26/18 Diabetic ulcer Foot Left;Lateral;Anterior unstageable   10/26/18    2130    Foot   180          Intake/Output Last 24 hours No intake or output data in the 24 hours ending 04/24/19 1811  Labs/Imaging Results for orders placed or performed during the hospital encounter of 04/24/19 (from the past 48 hour(s))  CBG monitoring, ED     Status: Abnormal   Collection Time: 04/24/19  7:11 AM  Result Value Ref Range   Glucose-Capillary 126 (H) 70 - 99 mg/dL  Comprehensive metabolic panel     Status: Abnormal   Collection Time: 04/24/19  7:26 AM  Result Value Ref Range   Sodium 129 (L) 135 - 145 mmol/L   Potassium 4.2 3.5 - 5.1 mmol/L   Chloride 86 (L) 98 - 111 mmol/L   CO2  21 (L) 22 - 32 mmol/L   Glucose, Bld 131 (H) 70 - 99 mg/dL   BUN 60 (H) 6 - 20 mg/dL   Creatinine, Ser 13.86 (H) 0.61 - 1.24 mg/dL   Calcium 9.5 8.9 - 10.3 mg/dL   Total Protein 7.7 6.5 - 8.1 g/dL   Albumin 3.3 (L) 3.5 - 5.0 g/dL   AST 19 15 - 41 U/L   ALT 13 0 - 44 U/L   Alkaline Phosphatase 182 (H) 38 - 126 U/L   Total Bilirubin 1.8 (H) 0.3 - 1.2 mg/dL   GFR calc non Af Amer 4 (L) >60 mL/min   GFR calc Af Amer 5 (L) >60 mL/min   Anion gap 22 (H) 5 - 15    Comment: Performed at [facility], [address] [place], [place] [address]  CBC     Status: Abnormal   Collection Time: 04/24/19  7:26 AM  Result Value Ref Range   WBC 12.7 (H) 4.0 - 10.5 K/uL   RBC 5.69 4.22 - 5.81 MIL/uL   Hemoglobin 17.5 (H) 13.0 - 17.0 g/dL   HCT 52.1 (H) 39.0 - 52.0 %   MCV 91.6 80.0 - 100.0 fL   MCH 30.8 26.0 - 34.0 pg   MCHC 33.6 30.0 - 36.0 g/dL   RDW 12.7 11.5 - 15.5 %   Platelets 135 (L) 150 - 400 K/uL   nRBC 0.0 0.0 - 0.2 %    Comment: Performed at [facility], [place] [address] [place], [place] [address]  Magnesium     Status: Abnormal   Collection Time:  04/24/19  7:26 AM  Result Value Ref Range   Magnesium 3.0 (H) 1.7 - 2.4 mg/dL    Comment: Performed at [facility] [address] [place], [place] [address]  Lactic acid, plasma     Status: None   Collection Time: 04/24/19  8:03 AM  Result Value Ref Range   Lactic Acid, Venous 1.3 0.5 - 1.9 mmol/L    Comment: Performed at [facility] [address], [place],  [address]  SARS Coronavirus 2 (CEPHEID - Performed in [place] hospital lab), Hosp Order     Status: None   Collection Time: 04/24/19  8:06 AM   Specimen: Nasopharyngeal Swab  Result Value Ref Range   SARS Coronavirus 2 NEGATIVE NEGATIVE    Comment: (NOTE) If result is NEGATIVE SARS-CoV-2 target nucleic acids are NOT DETECTED. The SARS-CoV-2 RNA is generally detectable in upper and lower  respiratory specimens during the acute phase of infection. The lowest  concentration of SARS-CoV-2 viral copies this assay can detect is 250  copies / mL. A negative result does not preclude SARS-CoV-2 infection  and should not be used as the sole basis for treatment or other  patient management decisions.  A negative result may occur with  improper specimen collection / handling, submission of specimen other  than nasopharyngeal swab, presence of viral mutation(s) within the  areas targeted by this assay, and inadequate number of viral copies  (<250 copies / mL). A negative result must be combined with clinical  observations, patient history, and epidemiological information. If result is POSITIVE SARS-CoV-2 target nucleic acids are DETECTED. The SARS-CoV-2 RNA is generally detectable in upper and lower  respiratory specimens dur ing the acute phase of infection.  Positive  results are indicative of active infection with SARS-CoV-2.  Clinical  correlation with patient history and other diagnostic information is  necessary to determine patient infection status.  Positive results do  not rule out bacterial infection  or co-infection with other viruses. If result is PRESUMPTIVE POSTIVE SARS-CoV-2 nucleic acids MAY BE PRESENT.   A presumptive positive result was obtained on the submitted specimen  and confirmed on repeat testing.  While 2019 novel coronavirus  (SARS-CoV-2) nucleic acids may be present in the submitted sample  additional confirmatory testing may be necessary for epidemiological  and / or clinical management purposes  to differentiate between  SARS-CoV-2 and other Sarbecovirus currently known to infect humans.  If clinically indicated additional testing with an alternate test  methodology [phone]) is advised. The SARS-CoV-2 RNA is generally  detectable in upper and lower respiratory sp ecimens during the acute  phase of infection. The expected result is Negative. Fact Sheet for Patients:  StrictlyIdeas.no Fact Sheet for Healthcare Providers: BankingDealers.co.za This test is not yet approved or cleared by the [place] FDA and has been authorized for detection and/or diagnosis of SARS-CoV-2 by FDA under an Emergency Use Authorization (EUA).  This EUA will remain in effect (meaning this test can be used) for the duration of the COVID-19 declaration under Section 564(b)(1) of the Act, 21 U.S.C. section 360bbb-3(b)(1), unless the authorization is terminated or revoked sooner. Performed at [facility], [place] [address] [place], [place] [address]   POCT I-Stat EG7     Status: Abnormal   Collection Time: 04/24/19  8:14 AM  Result Value Ref Range   pH, Ven 7.495 (H) 7.250 - 7.430   pCO2, Ven 32.0 (L) 44.0 - 60.0 mmHg   pO2, Ven 30.0 (LL) 32.0 - 45.0 mmHg   Bicarbonate 24.7 20.0 - 28.0 mmol/L   TCO2 26 22 - 32 mmol/L   O2 Saturation 63.0 %   Acid-Base Excess 2.0 0.0 - 2.0 mmol/L   Sodium 128 (L) 135 - 145 mmol/L   Potassium 4.1 3.5 - 5.1 mmol/L   Calcium, Ion 0.99 (L) 1.15 - 1.40 mmol/L   HCT 56.0 (H) 39.0 - 52.0 %    Hemoglobin 19.0 (H) 13.0 - 17.0 g/dL   Patient temperature HIDE    Sample type VENOUS    Comment NOTIFIED PHYSICIAN   C difficile quick scan w PCR reflex     Status: Abnormal   Collection Time: 04/24/19  8:19 AM   Specimen: Nasopharyngeal Swab; Stool  Result Value Ref Range   C Diff antigen POSITIVE (A) NEGATIVE   C Diff toxin NEGATIVE NEGATIVE   C Diff interpretation Results are indeterminate. See PCR results.     Comment: Performed at [facility], [place] [address], [place], [place] [address]  Protime-INR     Status: Abnormal   Collection Time: 04/24/19  8:19 AM  Result Value Ref Range   Prothrombin Time 15.4 (H) 11.4 - 15.2 seconds   INR 1.2 0.8 - 1.2    Comment: (NOTE) INR goal varies based on device and disease states. Performed at [facility], [place] [address] [place], [place] [address]   APTT     Status: Abnormal   Collection Time: 04/24/19  8:19 AM  Result Value Ref Range   aPTT 40 (H) 24 - 36 seconds    Comment:        IF BASELINE aPTT IS ELEVATED, SUGGEST PATIENT RISK ASSESSMENT BE USED TO DETERMINE APPROPRIATE ANTICOAGULANT THERAPY. Performed at [facility], [place] [address], [place], [place] [address]   C. Diff by PCR, Reflexed  Status: None   Collection Time: 04/24/19  8:19 AM  Result Value Ref Range   Toxigenic C. Difficile by PCR NEGATIVE NEGATIVE    Comment: Patient is colonized with non toxigenic C. difficile. May not need treatment unless significant symptoms are present. Performed at [place] [facility], [place] [address], [place], [place] [address]   Sedimentation rate     Status: None   Collection Time: 04/24/19  9:21 AM  Result Value Ref Range   Sed Rate 4 0 - 16 mm/hr    Comment: Performed at [facility] [address], [place], [place] [address]  C-reactive protein     Status: Abnormal   Collection Time: 04/24/19  9:21 AM  Result Value Ref Range   CRP 18.8 (H) <1.0 mg/dL    Comment: Performed at [facility] [address] [place], [place] [address]   Ct Head W Or Wo Contrast  Result Date: 04/24/2019 CLINICAL DATA:  38 year old male with confusion, dialysis patient, possible sepsis. EXAM: CT HEAD WITHOUT AND WITH CONTRAST TECHNIQUE: Contiguous axial images were obtained from the base of the skull through the vertex without and with intravenous contrast CONTRAST:  79mL OMNIPAQUE IOHEXOL 300 MG/ML  SOLN COMPARISON:  None. FINDINGS: Brain: Incidental bulky dural calcification along the falx. No midline shift, ventriculomegaly, mass effect, evidence of mass lesion, intracranial hemorrhage or evidence of cortically based acute infarction. Gray-white matter differentiation is within normal limits throughout the brain. No abnormal enhancement identified. Vascular: Calcified atherosclerosis at the skull base. The major intracranial vascular structures appear to enhance as expected. Skull: Negative. Sinuses/Orbits: Partially visible left maxillary retention cyst, otherwise clear. Other: Calcified scalp vessel atherosclerosis. No acute orbit or scalp soft tissue finding. IMPRESSION: 1. Normal for age CT appearance of the brain. 2. Calcified scalp vessel atherosclerosis, frequently seen in in stage renal disease/dialysis. Electronically Signed   By: Genevie Ann M.D.   On: 04/24/2019 11:05   Dg Chest Port 1 View  Result Date: 04/24/2019 CLINICAL DATA:  38 year old male dialysis patient with confusion, possible sepsis. EXAM: PORTABLE CHEST 1 VIEW COMPARISON:  10/20/2018 and earlier. FINDINGS: Portable AP semi upright view at 0813 hours. Lung volumes and mediastinal contours remain within normal limits. Visualized tracheal air column is within normal limits. Allowing for portable technique the lungs are clear. Stable right superior mediastinum vascular stent. Increased vascular stents in left axilla since January. Stable right axillary vascular stent. No osseous abnormality identified. Negative visible bowel gas pattern. IMPRESSION:  No acute cardiopulmonary abnormality. Electronically Signed   By: Genevie Ann M.D.   On: 04/24/2019 08:28    Pending Labs Unresulted Labs (From admission, onward)    Start     Ordered   04/25/19 4818  Basic metabolic panel  Tomorrow morning,   R    Question:  Specimen collection method  Answer:  IV Team=IV Team collect   04/24/19 1358   04/25/19 0500  CBC  Tomorrow morning,   R    Question:  Specimen collection method  Answer:  IV Team=IV Team collect   04/24/19 1358   04/24/19 1719  Aerobic/Anaerobic Culture (surgical/deep wound)  Once,   STAT    Question:  Patient immune status  Answer:  Normal   04/24/19 1718   04/24/19 1323  Urine rapid drug screen (hosp performed)  ONCE - STAT,   STAT     04/24/19 1322   04/24/19 1127  Gastrointestinal Panel by PCR , Stool  (Gastrointestinal Panel  by PCR, Stool)  Once,   STAT     04/24/19 1126   04/24/19 0803  Lactic acid, plasma  Now then every 2 hours,   STAT     04/24/19 0804   04/24/19 0803  Blood Culture (routine x 2)  BLOOD CULTURE X 2,   STAT     04/24/19 0804   04/24/19 0803  Urinalysis, Routine w reflex microscopic  ONCE - STAT,   STAT     04/24/19 0804   04/24/19 0803  Urine culture  ONCE - STAT,   STAT     04/24/19 0804   Signed and Held  CBC  Once,   R    Question:  Specimen collection method  Answer:  IV Team=IV Team collect   Signed and Held          Vitals/Pain Today's Vitals   04/24/19 1645 04/24/19 1700 04/24/19 1715 04/24/19 1730  BP: (!) 142/79 (!) 136/57 (!) 128/44 132/61  Pulse: 99 98 (!) 102 (!) 102  Resp: 17 11 18 14   Temp:      TempSrc:      SpO2: 100% 97% 95% 98%  Weight:      Height:      PainSc:        Isolation Precautions Enteric precautions (UV disinfection)  Medications Medications  sodium chloride flush (NS) 0.9 % injection 3 mL (3 mLs Intravenous Not Given 04/24/19 1227)  acyclovir (ZOVIRAX) 375 mg in dextrose 5 % 100 mL IVPB (has no administration in time range)  vancomycin variable dose per  unstable renal function (pharmacist dosing) (has no administration in time range)  heparin injection 5,000 Units (has no administration in time range)  acetaminophen (TYLENOL) tablet 650 mg (has no administration in time range)    Or  acetaminophen (TYLENOL) suppository 650 mg (has no administration in time range)  senna-docusate (Senokot-S) tablet 1 tablet (has no administration in time range)  promethazine (PHENERGAN) tablet 12.5 mg (12.5 mg Oral Given 04/24/19 1529)  calcitRIOL (ROCALTROL) capsule 0.25 mcg (has no administration in time range)  cinacalcet (SENSIPAR) tablet 60 mg (has no administration in time range)  sevelamer carbonate (RENVELA) tablet 1,600 mg (has no administration in time range)  Chlorhexidine Gluconate Cloth 2 % PADS 6 each (has no administration in time range)  metoCLOPramide (REGLAN) injection 5 mg (has no administration in time range)  pantoprazole (PROTONIX) injection 40 mg (has no administration in time range)  insulin glargine (LANTUS) injection 5 Units (has no administration in time range)  insulin aspart (novoLOG) injection 0-15 Units (has no administration in time range)  insulin aspart (novoLOG) injection 0-5 Units (has no administration in time range)  aspirin tablet 325 mg (has no administration in time range)  losartan (COZAAR) tablet 50 mg (has no administration in time range)  multivitamin (RENA-VIT) tablet 1 tablet (has no administration in time range)  cefTRIAXone (ROCEPHIN) 2 g in sodium chloride 0.9 % 100 mL IVPB (0 g Intravenous Stopped 04/24/19 1007)  vancomycin (VANCOCIN) 1,750 mg in sodium chloride 0.9 % 500 mL IVPB (0 mg Intravenous Stopped 04/24/19 1354)  acyclovir (ZOVIRAX) 375 mg in dextrose 5 % 100 mL IVPB (0 mg/kg  74.8 kg Intravenous Stopped 04/24/19 1225)  sodium chloride 0.9 % bolus 1,000 mL (0 mLs Intravenous Stopped 04/24/19 1620)  iohexol (OMNIPAQUE) 300 MG/ML solution 75 mL (75 mLs Intravenous Contrast Given 04/24/19 1045)  fentaNYL  (SUBLIMAZE) injection 50 mcg (50 mcg Intravenous Given 04/24/19 1114)  fentaNYL (SUBLIMAZE) injection 25  mcg (25 mcg Intravenous Given 04/24/19 1608)    Mobility walks High fall risk   Focused Assessments Neuro Assessment Handoff:  Swallow screen pass? No  Cardiac Rhythm: Normal sinus rhythm       Neuro Assessment: Exceptions to WDL Neuro Checks:      Last Documented NIHSS Modified Score:   Has TPA been given? No If patient is a Neuro Trauma and patient is going to OR before floor call report to [facility] nurse: [phone] or [phone]     R Recommendations: See Admitting Provider Note  Report given to:   Additional Notes:

## 2019-04-25 DIAGNOSIS — E1122 Type 2 diabetes mellitus with diabetic chronic kidney disease: Secondary | ICD-10-CM

## 2019-04-25 DIAGNOSIS — R197 Diarrhea, unspecified: Secondary | ICD-10-CM

## 2019-04-25 DIAGNOSIS — E1169 Type 2 diabetes mellitus with other specified complication: Secondary | ICD-10-CM

## 2019-04-25 DIAGNOSIS — E1022 Type 1 diabetes mellitus with diabetic chronic kidney disease: Secondary | ICD-10-CM

## 2019-04-25 DIAGNOSIS — E871 Hypo-osmolality and hyponatremia: Secondary | ICD-10-CM

## 2019-04-25 DIAGNOSIS — K3184 Gastroparesis: Secondary | ICD-10-CM

## 2019-04-25 DIAGNOSIS — B9561 Methicillin susceptible Staphylococcus aureus infection as the cause of diseases classified elsewhere: Secondary | ICD-10-CM

## 2019-04-25 DIAGNOSIS — E11621 Type 2 diabetes mellitus with foot ulcer: Secondary | ICD-10-CM

## 2019-04-25 DIAGNOSIS — E1043 Type 1 diabetes mellitus with diabetic autonomic (poly)neuropathy: Secondary | ICD-10-CM

## 2019-04-25 DIAGNOSIS — Z89412 Acquired absence of left great toe: Secondary | ICD-10-CM

## 2019-04-25 DIAGNOSIS — R112 Nausea with vomiting, unspecified: Secondary | ICD-10-CM

## 2019-04-25 DIAGNOSIS — Z794 Long term (current) use of insulin: Secondary | ICD-10-CM

## 2019-04-25 DIAGNOSIS — R7881 Bacteremia: Secondary | ICD-10-CM

## 2019-04-25 DIAGNOSIS — Z8619 Personal history of other infectious and parasitic diseases: Secondary | ICD-10-CM

## 2019-04-25 DIAGNOSIS — I120 Hypertensive chronic kidney disease with stage 5 chronic kidney disease or end stage renal disease: Secondary | ICD-10-CM

## 2019-04-25 DIAGNOSIS — D751 Secondary polycythemia: Secondary | ICD-10-CM

## 2019-04-25 DIAGNOSIS — E10621 Type 1 diabetes mellitus with foot ulcer: Secondary | ICD-10-CM

## 2019-04-25 DIAGNOSIS — R109 Unspecified abdominal pain: Secondary | ICD-10-CM

## 2019-04-25 DIAGNOSIS — R159 Full incontinence of feces: Secondary | ICD-10-CM

## 2019-04-25 DIAGNOSIS — Z79899 Other long term (current) drug therapy: Secondary | ICD-10-CM

## 2019-04-25 DIAGNOSIS — E1143 Type 2 diabetes mellitus with diabetic autonomic (poly)neuropathy: Secondary | ICD-10-CM

## 2019-04-25 DIAGNOSIS — T8744 Infection of amputation stump, left lower extremity: Secondary | ICD-10-CM

## 2019-04-25 DIAGNOSIS — E1069 Type 1 diabetes mellitus with other specified complication: Principal | ICD-10-CM

## 2019-04-25 LAB — BLOOD CULTURE ID PANEL (REFLEXED)

## 2019-04-25 LAB — BASIC METABOLIC PANEL
Anion gap: 21 — ABNORMAL HIGH (ref 5–15)
BUN: 73 mg/dL — ABNORMAL HIGH (ref 6–20)
CO2: 18 mmol/L — ABNORMAL LOW (ref 22–32)
Calcium: 8.9 mg/dL (ref 8.9–10.3)
Chloride: 90 mmol/L — ABNORMAL LOW (ref 98–111)
Creatinine, Ser: 15.6 mg/dL — ABNORMAL HIGH (ref 0.61–1.24)
GFR calc Af Amer: 4 mL/min — ABNORMAL LOW (ref >60)
GFR calc non Af Amer: 3 mL/min — ABNORMAL LOW (ref >60)
Glucose, Bld: 213 mg/dL — ABNORMAL HIGH (ref 70–99)
Potassium: 5.2 mmol/L — ABNORMAL HIGH (ref 3.5–5.1)
Sodium: 129 mmol/L — ABNORMAL LOW (ref 135–145)

## 2019-04-25 LAB — CBC
HCT: 49.9 % (ref 39.0–52.0)
Hemoglobin: 15.9 g/dL (ref 13.0–17.0)
MCH: 29.9 pg (ref 26.0–34.0)
MCHC: 31.9 g/dL (ref 30.0–36.0)
MCV: 93.8 fL (ref 80.0–100.0)
Platelets: 159 10*3/uL (ref 150–400)
RBC: 5.32 MIL/uL (ref 4.22–5.81)
RDW: 12.9 % (ref 11.5–15.5)
WBC: 11.9 10*3/uL — ABNORMAL HIGH (ref 4.0–10.5)
nRBC: 0 % (ref 0.0–0.2)

## 2019-04-25 LAB — DIFFERENTIAL
Abs Immature Granulocytes: 0.04 10*3/uL (ref 0.00–0.07)
Basophils Absolute: 0.1 10*3/uL (ref 0.0–0.1)
Basophils Relative: 0 %
Eosinophils Absolute: 0.3 10*3/uL (ref 0.0–0.5)
Eosinophils Relative: 2 %
Immature Granulocytes: 0 %
Lymphocytes Relative: 11 %
Lymphs Abs: 1.3 10*3/uL (ref 0.7–4.0)
Monocytes Absolute: 1 10*3/uL (ref 0.1–1.0)
Monocytes Relative: 8 %
Neutro Abs: 9.3 10*3/uL — ABNORMAL HIGH (ref 1.7–7.7)
Neutrophils Relative %: 79 %

## 2019-04-25 LAB — GASTROINTESTINAL PANEL BY PCR, STOOL (REPLACES STOOL CULTURE)

## 2019-04-25 LAB — GLUCOSE, CAPILLARY
Glucose-Capillary: 132 mg/dL — ABNORMAL HIGH (ref 70–99)
Glucose-Capillary: 183 mg/dL — ABNORMAL HIGH (ref 70–99)
Glucose-Capillary: 329 mg/dL — ABNORMAL HIGH (ref 70–99)
Glucose-Capillary: 481 mg/dL — ABNORMAL HIGH (ref 70–99)
Glucose-Capillary: 189 mg/dL — ABNORMAL HIGH (ref 70–99)

## 2019-04-25 MED ORDER — PRO-STAT SUGAR FREE PO LIQD
30.0000 mL | Freq: Two times a day (BID) | ORAL | Status: DC
  Filled 2019-04-25 (×3): qty 30

## 2019-04-25 MED ORDER — INSULIN ASPART 100 UNIT/ML ~~LOC~~ SOLN
3.0000 [IU] | Freq: Three times a day (TID) | SUBCUTANEOUS | Status: DC
  Administered 2019-04-26 – 2019-04-27 (×2): 3 [IU] via SUBCUTANEOUS

## 2019-04-25 MED ORDER — SODIUM CHLORIDE 0.9 % IV SOLN
2.0000 g | INTRAVENOUS | Status: DC
  Administered 2019-04-25: 2 g via INTRAVENOUS
  Filled 2019-04-25: qty 20

## 2019-04-25 MED ORDER — INSULIN GLARGINE 100 UNIT/ML ~~LOC~~ SOLN
8.0000 [IU] | Freq: Every day | SUBCUTANEOUS | Status: DC
  Administered 2019-04-25 – 2019-04-26 (×2): 8 [IU] via SUBCUTANEOUS
  Filled 2019-04-25 (×3): qty 0.08

## 2019-04-25 MED ORDER — VANCOMYCIN HCL IN DEXTROSE 750-5 MG/150ML-% IV SOLN
750.0000 mg | Freq: Once | INTRAVENOUS | Status: AC
  Administered 2019-04-25: 750 mg via INTRAVENOUS
  Filled 2019-04-25: qty 150

## 2019-04-25 MED ORDER — SODIUM BICARBONATE 650 MG PO TABS
1300.0000 mg | ORAL_TABLET | Freq: Two times a day (BID) | ORAL | Status: AC
  Administered 2019-04-25 – 2019-04-26 (×3): 1300 mg via ORAL
  Filled 2019-04-25 (×2): qty 2

## 2019-04-25 MED ORDER — HYDROMORPHONE HCL 1 MG/ML IJ SOLN
0.5000 mg | Freq: Four times a day (QID) | INTRAMUSCULAR | Status: DC | PRN
  Administered 2019-04-25 – 2019-04-27 (×8): 0.5 mg via INTRAVENOUS
  Filled 2019-04-25 (×7): qty 1

## 2019-04-25 MED ORDER — CEFAZOLIN SODIUM-DEXTROSE 2-4 GM/100ML-% IV SOLN
2.0000 g | INTRAVENOUS | Status: DC
  Filled 2019-04-25: qty 100

## 2019-04-25 MED ORDER — HYDROMORPHONE HCL 1 MG/ML IJ SOLN
0.5000 mg | Freq: Once | INTRAMUSCULAR | Status: AC
  Administered 2019-04-25: 0.5 mg via INTRAVENOUS
  Filled 2019-04-25: qty 1

## 2019-04-25 MED ORDER — SODIUM CHLORIDE 0.9 % IV SOLN
INTRAVENOUS | Status: AC
  Administered 2019-04-25: 18:00:00 via INTRAVENOUS

## 2019-04-25 MED ORDER — PANTOPRAZOLE SODIUM 40 MG IV SOLR
40.0000 mg | Freq: Once | INTRAVENOUS | Status: AC
  Administered 2019-04-25: 40 mg via INTRAVENOUS
  Filled 2019-04-25: qty 40

## 2019-04-25 NOTE — Progress Notes (Signed)
Attempted to get patient for 2nd time today for hemodialysis pt. Refused states that he will come tomorrow on his regular treatment day. Anice Paganini PA made aware

## 2019-04-25 NOTE — Progress Notes (Signed)
PHARMACY - PHYSICIAN COMMUNICATION CRITICAL VALUE ALERT - BLOOD CULTURE IDENTIFICATION (BCID)  SHIGERU LAMPERT is an 38 y.o. male who presented to [facility] on 04/24/2019 with a chief complaint of NVD, confusion, and decreased appetite. PMH s/f DFU s/p I&D on 04/14/2019 with noted serosanguinous drainage from plantar aspect of L foot on admit. BCx drawn, 1/4 bottles now growing GPC and BCID MSSA.  Name of physician (or Provider) Contacted: Terri Piedra, NP (ID)  Current antibiotics: Vancomycin + ceftriaxone  Changes to prescribed antibiotics recommended:  D/c vancomycin and ceftriaxone. Start cefazolin. Recommendations accepted by provider.  Results for orders placed or performed during the hospital encounter of 04/24/19  Blood Culture ID Panel (Reflexed) (Collected: 04/24/2019  9:15 AM)  Result Value Ref Range   Enterococcus species NOT DETECTED NOT DETECTED   Listeria monocytogenes NOT DETECTED NOT DETECTED   Staphylococcus species DETECTED (A) NOT DETECTED   Staphylococcus aureus (BCID) DETECTED (A) NOT DETECTED   Methicillin resistance NOT DETECTED NOT DETECTED   Streptococcus species NOT DETECTED NOT DETECTED   Streptococcus agalactiae NOT DETECTED NOT DETECTED   Streptococcus pneumoniae NOT DETECTED NOT DETECTED   Streptococcus pyogenes NOT DETECTED NOT DETECTED   Acinetobacter baumannii NOT DETECTED NOT DETECTED   Enterobacteriaceae species NOT DETECTED NOT DETECTED   Enterobacter cloacae complex NOT DETECTED NOT DETECTED   Escherichia coli NOT DETECTED NOT DETECTED   Klebsiella oxytoca NOT DETECTED NOT DETECTED   Klebsiella pneumoniae NOT DETECTED NOT DETECTED   Proteus species NOT DETECTED NOT DETECTED   Serratia marcescens NOT DETECTED NOT DETECTED   Haemophilus influenzae NOT DETECTED NOT DETECTED   Neisseria meningitidis NOT DETECTED NOT DETECTED   Pseudomonas aeruginosa NOT DETECTED NOT DETECTED   Candida albicans NOT DETECTED NOT DETECTED   Candida glabrata NOT DETECTED  NOT DETECTED   Candida krusei NOT DETECTED NOT DETECTED   Candida parapsilosis NOT DETECTED NOT DETECTED   Candida tropicalis NOT DETECTED NOT DETECTED   Erin N. Gerarda Fraction, PharmD, [facility] Clinical Pharmacist Phone: [phone] 04/25/2019  2:58 PM

## 2019-04-25 NOTE — Progress Notes (Signed)
Subjective:  Patient ID: [name], male    DOB: 03-19-1981,  MRN: [id number]  Seen at bedside. Much more alert tonight. Barely remembers talking to me yesterday. Objective:   Vitals:   04/25/19 1658 04/25/19 1659  BP: (!) 154/88   Pulse: 93   Resp: 18   Temp:  98.2 F (36.8 C)  SpO2: 100%    General AA&O x3. Normal mood and affect.  Vascular Left foot warm. Slight edema  Neurologic Epicritic sensation grossly absent.  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 Probe to bone. Wound base slight necrosis.   Hyperkeratosis plantar foot right. No open ulceration.  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   Results for orders placed or performed during the hospital encounter of 04/24/19  Blood Culture (routine x 2)     Status: None (Preliminary result)   Collection Time: 04/24/19  8:01 AM   Specimen: BLOOD  Result Value Ref Range Status   Specimen Description BLOOD RIGHT ANTECUBITAL  Final   Special Requests   Final    BOTTLES DRAWN AEROBIC AND ANAEROBIC Blood Culture adequate volume   Culture   Final    NO GROWTH < 24 HOURS Performed at [facility], [address], [place], [name] [address]    Report Status PENDING  Incomplete  SARS Coronavirus 2 (CEPHEID - Performed in [facility] hospital lab), Hosp Order     Status: None   Collection Time: 04/24/19  8:06 AM   Specimen: Nasopharyngeal Swab  Result Value Ref Range Status   SARS Coronavirus 2 NEGATIVE NEGATIVE Final    Comment: (NOTE) If result is NEGATIVE SARS-CoV-2 target nucleic acids are NOT DETECTED. The SARS-CoV-2 RNA is generally detectable in upper and lower  respiratory specimens during the acute phase of infection. The lowest  concentration of SARS-CoV-2 viral copies this assay can detect is 250  copies / mL. A negative result does not preclude SARS-CoV-2 infection  and should not be used as the sole basis for treatment or other  patient  management decisions.  A negative result may occur with  improper specimen collection / handling, submission of specimen other  than nasopharyngeal swab, presence of viral mutation(s) within the  areas targeted by this assay, and inadequate number of viral copies  (<250 copies / mL). A negative result must be combined with clinical  observations, patient history, and epidemiological information. If result is POSITIVE SARS-CoV-2 target nucleic acids are DETECTED. The SARS-CoV-2 RNA is generally detectable in upper and lower  respiratory specimens dur ing the acute phase of infection.  Positive  results are indicative of active infection with SARS-CoV-2.  Clinical  correlation with patient history and other diagnostic information is  necessary to determine patient infection status.  Positive results do  not rule out bacterial infection or co-infection with other viruses. If result is PRESUMPTIVE POSTIVE SARS-CoV-2 nucleic acids MAY BE PRESENT.   A presumptive positive result was obtained on the submitted specimen  and confirmed on repeat testing.  While 2019 novel coronavirus  (SARS-CoV-2) nucleic acids may be present in the submitted sample  additional confirmatory testing may be necessary for epidemiological  and / or clinical management purposes  to differentiate between  SARS-CoV-2 and other Sarbecovirus currently known to infect humans.  If clinically indicated additional testing with an alternate test  methodology [phone]) is advised. The SARS-CoV-2 RNA is generally  detectable in upper and lower respiratory sp ecimens during  the acute  phase of infection. The expected result is Negative. Fact Sheet for Patients:  BoilerBrush.com.cy Fact Sheet for Healthcare Providers: https://pope.com/ This test is not yet approved or cleared by the [place] FDA and has been authorized for detection and/or diagnosis of SARS-CoV-2 by FDA under  an Emergency Use Authorization (EUA).  This EUA will remain in effect (meaning this test can be used) for the duration of the COVID-19 declaration under Section 564(b)(1) of the Act, 21 U.S.C. section 360bbb-3(b)(1), unless the authorization is terminated or revoked sooner. Performed at [facility], [address] [place], [name] [address]   C difficile quick scan w PCR reflex     Status: Abnormal   Collection Time: 04/24/19  8:19 AM   Specimen: Nasopharyngeal Swab; Stool  Result Value Ref Range Status   C Diff antigen POSITIVE (A) NEGATIVE Final   C Diff toxin NEGATIVE NEGATIVE Final   C Diff interpretation Results are indeterminate. See PCR results.  Final    Comment: Performed at [facility], [address] [place], [name] [address]  C. Diff by PCR, Reflexed     Status: None   Collection Time: 04/24/19  8:19 AM  Result Value Ref Range Status   Toxigenic C. Difficile by PCR NEGATIVE NEGATIVE Final    Comment: Patient is colonized with non toxigenic C. difficile. May not need treatment unless significant symptoms are present. Performed at [facility], [address] [place], [name] [address]   Blood Culture (routine x 2)     Status: None (Preliminary result)   Collection Time: 04/24/19  9:15 AM   Specimen: BLOOD  Result Value Ref Range Status   Specimen Description BLOOD SITE NOT SPECIFIED  Final   Special Requests   Final    BOTTLES DRAWN AEROBIC AND ANAEROBIC Blood Culture adequate volume   Culture  Setup Time   Final    GRAM POSITIVE COCCI IN CLUSTERS AEROBIC BOTTLE ONLY Organism ID to follow CRITICAL RESULT CALLED TO, READ BACK BY AND VERIFIED WITH: Talmadge Chad PharmD 14:45 04/25/19 ([email or web address]) Performed at [facility], [address], [place], [name] [address]    Culture GRAM POSITIVE COCCI  Final   Report Status PENDING  Incomplete  Blood Culture ID Panel (Reflexed)     Status: Abnormal   Collection Time: 04/24/19  9:15 AM  Result  Value Ref Range Status   Enterococcus species NOT DETECTED NOT DETECTED Final   Listeria monocytogenes NOT DETECTED NOT DETECTED Final   Staphylococcus species DETECTED (A) NOT DETECTED Final    Comment: CRITICAL RESULT CALLED TO, READ BACK BY AND VERIFIED WITH: Talmadge Chad PharmD 14:45 04/25/19 ([email or web address])    Staphylococcus aureus (BCID) DETECTED (A) NOT DETECTED Final    Comment: Methicillin (oxacillin) susceptible Staphylococcus aureus (MSSA). Preferred therapy is anti staphylococcal beta lactam antibiotic (Cefazolin or Nafcillin), unless clinically contraindicated. CRITICAL RESULT CALLED TO, READ BACK BY AND VERIFIED WITH: Talmadge Chad PharmD 14:45 04/25/19 ([email or web address])    Methicillin resistance NOT DETECTED NOT DETECTED Final   Streptococcus species NOT DETECTED NOT DETECTED Final   Streptococcus agalactiae NOT DETECTED NOT DETECTED Final   Streptococcus pneumoniae NOT DETECTED NOT DETECTED Final   Streptococcus pyogenes NOT DETECTED NOT DETECTED Final   Acinetobacter baumannii NOT DETECTED NOT DETECTED Final   Enterobacteriaceae species NOT DETECTED NOT DETECTED Final   Enterobacter cloacae complex NOT DETECTED NOT DETECTED Final   Escherichia coli NOT DETECTED NOT DETECTED Final   Klebsiella  oxytoca NOT DETECTED NOT DETECTED Final   Klebsiella pneumoniae NOT DETECTED NOT DETECTED Final   Proteus species NOT DETECTED NOT DETECTED Final   Serratia marcescens NOT DETECTED NOT DETECTED Final   Haemophilus influenzae NOT DETECTED NOT DETECTED Final   Neisseria meningitidis NOT DETECTED NOT DETECTED Final   Pseudomonas aeruginosa NOT DETECTED NOT DETECTED Final   Candida albicans NOT DETECTED NOT DETECTED Final   Candida glabrata NOT DETECTED NOT DETECTED Final   Candida krusei NOT DETECTED NOT DETECTED Final   Candida parapsilosis NOT DETECTED NOT DETECTED Final   Candida tropicalis NOT DETECTED NOT DETECTED Final    Comment: Performed at [facility], [address]  [place], [name] [address]  Gastrointestinal Panel by PCR , Stool     Status: None   Collection Time: 04/24/19  4:20 PM   Specimen: Stool  Result Value Ref Range Status   Campylobacter species NOT DETECTED NOT DETECTED Final   Plesimonas shigelloides NOT DETECTED NOT DETECTED Final   Salmonella species NOT DETECTED NOT DETECTED Final   Yersinia enterocolitica NOT DETECTED NOT DETECTED Final   Vibrio species NOT DETECTED NOT DETECTED Final   Vibrio cholerae NOT DETECTED NOT DETECTED Final   Enteroaggregative E coli (EAEC) NOT DETECTED NOT DETECTED Final   Enteropathogenic E coli (EPEC) NOT DETECTED NOT DETECTED Final   Enterotoxigenic E coli (ETEC) NOT DETECTED NOT DETECTED Final   Shiga like toxin producing E coli (STEC) NOT DETECTED NOT DETECTED Final   Shigella/Enteroinvasive E coli (EIEC) NOT DETECTED NOT DETECTED Final   Cryptosporidium NOT DETECTED NOT DETECTED Final   Cyclospora cayetanensis NOT DETECTED NOT DETECTED Final   Entamoeba histolytica NOT DETECTED NOT DETECTED Final   Giardia lamblia NOT DETECTED NOT DETECTED Final   Adenovirus F40/41 NOT DETECTED NOT DETECTED Final   Astrovirus NOT DETECTED NOT DETECTED Final   Norovirus GI/GII NOT DETECTED NOT DETECTED Final   Rotavirus A NOT DETECTED NOT DETECTED Final   Sapovirus (I, II, IV, and V) NOT DETECTED NOT DETECTED Final    Comment: Performed at [facility], [address] [place], [name] [address]  Aerobic/Anaerobic Culture (surgical/deep wound)     Status: None (Preliminary result)   Collection Time: 04/24/19  7:39 PM   Specimen: Tissue  Result Value Ref Range Status   Specimen Description TISSUE LEFT FOOT  Final   Special Requests Immunocompromised  Final   Gram Stain NO WBC SEEN FEW GRAM POSITIVE COCCI   Final   Culture   Final    CULTURE REINCUBATED FOR BETTER GROWTH Performed at [facility], [address] [place], [name] [address]    Report Status PENDING  Incomplete    Assessment & Plan:    Patient was evaluated and treated and all questions answered.  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 -BC reviewed MSSA likely 2/2 foot per IM team. -Again discussed surgery with patient. Patient refuses. Will trial abx. Discussed that there is no guarantees with this, he will be on abx for extended period, and he may (and likely will) still need surgery. -Discussed if he really wants his foot to have a chance at healing he will need to be compliant with wearing of a boot to better immobilize. Patient reluctantly agreed. -Discussed getting a PCP so that he can get DM shoes and a hallux filler if and when his shoes healed. -Will need HHC at [facility].  Has history of being dismissed from several home health care agencies. I believe he was most recently with [facility] who he was discharged in good status on 03/06/2019 as his wound had healed. Hopefully he will be able to get set up with them again. Recommend they pack the wound with aquacell packing thrice weekly followed by dry sterile dressing.   PAD -He does have known PAD, but only a few weeks ago did heal a wound to the same area of his foot. This new wound to the plantar foot was caused from not wearing his surgical shoe (as his wound healed he threw out his shoe) and then non-compliance with care. Though I agree his perfusion is diminished 2/2 vascular calcification as is common in patients on HD, I think he does have a chance at healing from a vascular perspective. I am more concerned that the infection will not resolve without surgery since dead bone will be left in the area.  Will trial abx therapy per ID reccs, aggressive outpatient wound care.   Podiatry to sign off at this time.  Park Liter, DPM  Accessible via secure chat for questions or concerns.

## 2019-04-25 NOTE — Progress Notes (Signed)
Report received from primary nurse pt. Refusing to come to HD this morning.

## 2019-04-25 NOTE — Progress Notes (Signed)
Renal Navigator notified OP HD clinic/[facility] of patient's admission and negative COVID 19 rapid test result in order to provide continuity of care and safety.  Macy Lingenfelter Elizabeth, LCSW Renal Navigator [phone] 

## 2019-04-25 NOTE — Progress Notes (Signed)
New Admission Note: ? Arrival Method: Stretcher Mental Orientation: Alert and Oriented x4 Telemetry: Yes Assessment: Completed Skin: Refer to flowsheet IV: Right Upper Arm Pain: 10 Tubes: None Safety Measures: Safety Fall Prevention Plan discussed with patient. Admission: Completed [facility] [place] Orientation: Patient has been orientated to the room, unit and the staff. Family: None at the Bedside Orders have been reviewed and are being implemented. Will continue to monitor the patient. Call light has been placed within reach and bed alarm has been activated.  ? Milagros Loll), RN  Phone Number: [phone]

## 2019-04-25 NOTE — Plan of Care (Signed)
  Problem: Pain Managment: Goal: General experience of comfort will improve Outcome: Progressing   Problem: Safety: Goal: Ability to remain free from injury will improve Outcome: Progressing   

## 2019-04-25 NOTE — Progress Notes (Signed)
Per Physician asked patient to give me his insulin and not take it anymore and he refuses.

## 2019-04-25 NOTE — Consult Note (Addendum)
[facility]    Date of Admission:  04/24/2019     Total days of antibiotics 1               Reason for Consult:  Osteomyelitis  Referring Provider: Rebeca Alert Primary Care Provider: Elgergawy, Silver Huguenin, MD   Assessment/Plan:  [name] is a 38 year old male admitted with the gastrointestinal symptoms of nausea/vomiting/diarrhea along with a diabetic foot ulcer of the left foot found to have osteomyelitis of the left first ray status post bedside incision and drainage with cultures obtained.  Gram stain showing gram-positive cocci with cultures reintubated for better growth.  Currently on Bactrim vancomycin and ceftriaxone.  Diarrhea/nausea/vomiting have slightly improved since initial onset and eating during examination without problem.  C. difficile testing negative and awaiting GI panel results.   ADDENDUM: Blood cultures now positive for MSSA in 1/4 bottles.   Left first ray osteomyelitis -currently on broad-spectrum antibiotics.  Circulation remains a concern and may benefit from ankle-brachial index.  Discussed importance of IV antibiotics to help resolve his osteomyelitis as opposed to oral antibiotics.  Agree with podiatry assessment that further amputation is likely necessary.  [name] is hoping to avoid for surgery this time.  Check inflammatory markers.  Change vancomycin and ceftraixone to Ancef for MSSA infection.  MSSA bacteremia - Blood cultures newly positive for MSSA with most likely source being the left foot and may also explain his encephalopathy. Will check TTE to rule out endocarditis and repeat blood cultures in the morning.   Diarrhea -3-day history of diarrhea with negative C. difficile testing and GI panel still pending.  Possibly viral related.  We will continue enteric precautions pending GI panel results.  Have significant history of gastroparesis.  GI has been consulted for further evaluation.  Type 1 diabetes -blood sugars elevated  on admission at 337 and ranging between 180 and 340.  Discussed importance of maintaining blood sugar levels to aid healing process and reduce risk for complicated healing and further risk of infection.  Discussed importance of monitoring his feet on a daily basis and reporting any new injuries/wounds.  Continue management per primary team  End-stage renal disease on dialysis - currently Monday Wednesday Friday.  Continue per nephrology.  Encephalopathy -appears resolved as he is alert and oriented today.   Active Problems:   ESRD on dialysis 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 aspirin  325 mg Oral QHS  . calcitRIOL  0.25 mcg Oral Q T,Th,Sa-HD  . Chlorhexidine Gluconate Cloth  6 each Topical Q0600  . cinacalcet  60 mg Oral Q supper  . heparin  5,000 Units Subcutaneous Q8H  . insulin aspart  0-15 Units Subcutaneous TID WC  . insulin aspart  0-15 Units Subcutaneous Once  . insulin aspart  0-5 Units Subcutaneous QHS  . insulin glargine  5 Units Subcutaneous QHS  . metoCLOPramide (REGLAN) injection  5 mg Intravenous Q12H  . multivitamin  1 tablet Oral QHS  . pantoprazole (PROTONIX) IV  40 mg Intravenous Q24H  . ramelteon  8 mg Oral QHS  . sevelamer carbonate  1,600 mg Oral TID WC  . sodium chloride flush  3 mL Intravenous Once     HPI: [name]  is a 38 y.o. male with previous medical history of end-stage renal disease on dialysis (M, W, F), insulin-dependent diabetes, diabetic gastroparesis, and diabetic foot ulcer admitted with nausea, vomiting, confusion, diarrhea, decreased appetite of 3 days duration.  Vital signs in the ED with elevated temperature 100.2, tachycardic and normotensive.  Mentation was noted to be altered stating he wanted to go to the  ballgame.  Blood cultures drawn and started on vancomycin, ceftriaxone, and acyclovir. CT head with normal appearance and no acute intracranial pathology. Left foot x-ray with progressive erosion of the medial first metatarsal sesamoid consistent with osteomyelitis along with unchanged erosive and sclerotic changes of the second toe.   [name] has been seen by ID in the past for infections of the left foot in May of 2019 following right fifth toe and metatarsal amputation on the right with cultures being polymicrobial with Bacteroides, Enterococcus faecalis, and MSSA.  He was treated again in November 2019 again with Bacteroides present.  In January 2020 cultures from an abscess of the left foot with MSSA where he underwent left great toe amputation with I&D.  In February 2020 underwent repeat I&D and partial wound closure..  Since that time he has been under the care podiatry with previous recommendation to follow-up with infectious disease which was not completed.  In June 2020 was seen with concern for chronic infection and was started on Ancef with dialysis. Last seen on 04/14/19 with a new wound to his left foot at the plantar aspect of the first metatarsal.  [name] had been having abdominal pain, nausea, vomiting and fever for about 3 days prior to arrival. He has a history of gastroparesis and GI has been consulted for further evaluation. [name] has been afebrile since admission and blood cultures have been without growth in <24 hours. Podiatry evaluated with purulent ulcer on exam and bedside I&D performed with deep cultures being collected for culture. Recommending partial 1st ray amputation with likely transmetatarsal amputation. ID has been consulted for antibiotic recommendations.    Review of Systems: Review of Systems  Constitutional: Negative for chills, fever and weight loss.  Respiratory: Negative for cough, shortness of breath and wheezing.   Cardiovascular: Negative for chest  pain and leg swelling.  Gastrointestinal: Positive for diarrhea. Negative for abdominal pain, constipation, nausea and vomiting.  Musculoskeletal:       Positive for left toe wound  Skin: Negative for rash.     Past Medical History:  Diagnosis Date  . Diabetes mellitus without complication ([place])   . Diabetic gastroparesis ([employer])   . Dialysis patient ([place])   . Hypertension   . Renal disorder    Dialysis  . Sepsis [facility])     Social History   Tobacco Use  . Smoking status: Never Smoker  . Smokeless tobacco: Never Used  Substance Use Topics  . Alcohol use: No  . Drug use: No    Family History  Problem Relation Age of Onset  . Diabetes Mellitus II Other   . Diabetes Father   . Renal Disease Father        ESRD    No Known Allergies  OBJECTIVE: Blood pressure 133/82, pulse 92, temperature 98.4 F (36.9 C), temperature source Oral, resp. rate 18, height 5\' 5"  (1.651 m), weight 74 kg, SpO2 100 %.  Physical Exam Constitutional:      General: He is not in acute distress.    Appearance: He is well-developed.     Comments: Seated in  bed; excitable at times  Cardiovascular:     Rate and Rhythm: Normal rate and regular rhythm.     Heart sounds: Normal heart sounds.  Pulmonary:     Effort: Pulmonary effort is normal.     Breath sounds: Normal breath sounds.  Musculoskeletal:     Comments: Post surgical dressing in place.   Skin:    General: Skin is warm and dry.  Neurological:     Mental Status: He is alert and oriented to person, place, and time.  Psychiatric:        Mood and Affect: Mood normal.     Lab Results Lab Results  Component Value Date   WBC 11.9 (H) 04/25/2019   HGB 15.9 04/25/2019   HCT 49.9 04/25/2019   MCV 93.8 04/25/2019   PLT 159 04/25/2019    Lab Results  Component Value Date   CREATININE 15.60 (H) 04/25/2019   BUN 73 (H) 04/25/2019   NA 129 (L) 04/25/2019   K 5.2 (H) 04/25/2019   CL 90 (L) 04/25/2019   CO2 18 (L) 04/25/2019    Lab  Results  Component Value Date   ALT 13 04/24/2019   AST 19 04/24/2019   ALKPHOS 182 (H) 04/24/2019   BILITOT 1.8 (H) 04/24/2019     Microbiology: Recent Results (from the past 240 hour(s))  Blood Culture (routine x 2)     Status: None (Preliminary result)   Collection Time: 04/24/19  8:01 AM   Specimen: BLOOD  Result Value Ref Range Status   Specimen Description BLOOD RIGHT ANTECUBITAL  Final   Special Requests   Final    BOTTLES DRAWN AEROBIC AND ANAEROBIC Blood Culture adequate volume   Culture   Final    NO GROWTH < 24 HOURS Performed at [facility], [address], [place], [place] [address]    Report Status PENDING  Incomplete  SARS Coronavirus 2 (CEPHEID - Performed in [place] hospital lab), Hosp Order     Status: None   Collection Time: 04/24/19  8:06 AM   Specimen: Nasopharyngeal Swab  Result Value Ref Range Status   SARS Coronavirus 2 NEGATIVE NEGATIVE Final    Comment: (NOTE) If result is NEGATIVE SARS-CoV-2 target nucleic acids are NOT DETECTED. The SARS-CoV-2 RNA is generally detectable in upper and lower  respiratory specimens during the acute phase of infection. The lowest  concentration of SARS-CoV-2 viral copies this assay can detect is 250  copies / mL. A negative result does not preclude SARS-CoV-2 infection  and should not be used as the sole basis for treatment or other  patient management decisions.  A negative result may occur with  improper specimen collection / handling, submission of specimen other  than nasopharyngeal swab, presence of viral mutation(s) within the  areas targeted by this assay, and inadequate number of viral copies  (<250 copies / mL). A negative result must be combined with clinical  observations, patient history, and epidemiological information. If result is POSITIVE SARS-CoV-2 target nucleic acids are DETECTED. The SARS-CoV-2 RNA is generally detectable in upper and lower  respiratory specimens dur ing the acute  phase of infection.  Positive  results are indicative of active infection with SARS-CoV-2.  Clinical  correlation with patient history and other diagnostic information is  necessary to determine patient infection status.  Positive results do  not rule out bacterial infection or co-infection with other viruses. If result is PRESUMPTIVE POSTIVE SARS-CoV-2 nucleic acids MAY BE PRESENT.   A  presumptive positive result was obtained on the submitted specimen  and confirmed on repeat testing.  While 2019 novel coronavirus  (SARS-CoV-2) nucleic acids may be present in the submitted sample  additional confirmatory testing may be necessary for epidemiological  and / or clinical management purposes  to differentiate between  SARS-CoV-2 and other Sarbecovirus currently known to infect humans.  If clinically indicated additional testing with an alternate test  methodology [phone]) is advised. The SARS-CoV-2 RNA is generally  detectable in upper and lower respiratory sp ecimens during the acute  phase of infection. The expected result is Negative. Fact Sheet for Patients:  StrictlyIdeas.no Fact Sheet for Healthcare Providers: BankingDealers.co.za This test is not yet approved or cleared by the [place] FDA and has been authorized for detection and/or diagnosis of SARS-CoV-2 by FDA under an Emergency Use Authorization (EUA).  This EUA will remain in effect (meaning this test can be used) for the duration of the COVID-19 declaration under Section 564(b)(1) of the Act, 21 U.S.C. section 360bbb-3(b)(1), unless the authorization is terminated or revoked sooner. Performed at [facility], [place] [address], [place], [place] [address]   C difficile quick scan w PCR reflex     Status: Abnormal   Collection Time: 04/24/19  8:19 AM   Specimen: Nasopharyngeal Swab; Stool  Result Value Ref Range Status   C Diff antigen POSITIVE (A) NEGATIVE Final   C  Diff toxin NEGATIVE NEGATIVE Final   C Diff interpretation Results are indeterminate. See PCR results.  Final    Comment: Performed at [facility], [place] [address], [place], [place] [address]  C. Diff by PCR, Reflexed     Status: None   Collection Time: 04/24/19  8:19 AM  Result Value Ref Range Status   Toxigenic C. Difficile by PCR NEGATIVE NEGATIVE Final    Comment: Patient is colonized with non toxigenic C. difficile. May not need treatment unless significant symptoms are present. Performed at [facility], [place] [address], [place], [place] [address]   Blood Culture (routine x 2)     Status: None (Preliminary result)   Collection Time: 04/24/19  9:15 AM   Specimen: BLOOD  Result Value Ref Range Status   Specimen Description BLOOD SITE NOT SPECIFIED  Final   Special Requests   Final    BOTTLES DRAWN AEROBIC AND ANAEROBIC Blood Culture adequate volume   Culture   Final    NO GROWTH < 24 HOURS Performed at [facility], [place] [address], [place], [place] [address]    Report Status PENDING  Incomplete  Aerobic/Anaerobic Culture (surgical/deep wound)     Status: None (Preliminary result)   Collection Time: 04/24/19  7:39 PM   Specimen: Tissue  Result Value Ref Range Status   Specimen Description TISSUE LEFT FOOT  Final   Special Requests Immunocompromised  Final   Gram Stain NO WBC SEEN FEW GRAM POSITIVE COCCI   Final   Culture   Final    CULTURE REINCUBATED FOR BETTER GROWTH Performed at [facility], [place] [address] [place], [place] [address]    Report Status PENDING  Incomplete     Terri Piedra, [place] [facility] [place] Pager  04/25/2019  10:03 AM

## 2019-04-25 NOTE — Progress Notes (Addendum)
  Date: 04/25/2019  Patient name: [name]  Medical record number: [id number]  Date of birth: 12-Oct-1980   I have seen and evaluated this patient and I have discussed the plan of care with the house staff. Please see their note for complete details. I concur with their findings with the following additions/corrections:   Admitted with confusion and GI symptoms, found to have elevated CRP and evidence of infection with osteomyelitis of his prior amputation site of his L first toe. Appreciate consult from Dr. March Rummage, who did and I and D and sent cultures, now showing GPCs. Today, blood culture is positive for MSSA. We have consulted ID, planning cefazolin.   Unfortunately, he adamantly declines further surgery. I suspect given his poor vasculature, this likely will not respond to antibiotics alone, but will trial 6 week course.  Unclear cause of erythrocytosis, will review smear.   Lenice Pressman, M.D., Ph.D. 04/25/2019, 4:38 PM

## 2019-04-25 NOTE — Progress Notes (Signed)
Initial Nutrition Assessment  DOCUMENTATION CODES:   Not applicable  INTERVENTION:    30 ml Prostat BID, each supplement provides 100 kcals and 15 grams protein.   Snacks BID  Renal MVI daily  NUTRITION DIAGNOSIS:   Increased nutrient needs related to wound healing as evidenced by estimated needs.  GOAL:   Patient will meet greater than or equal to 90% of their needs  MONITOR:   PO intake, Supplement acceptance, Weight trends, Labs, I & O's  REASON FOR ASSESSMENT:   Consult Diet education  ASSESSMENT:   Patient with PMH significant for DM, gastroparesis, HTN, and ESRD on HD. Presents this admission with AMS from unclear etiology.   Spoke with pt via phone. Three days PTA pt experienced nausea with ongoing vomiting (likely related to gastroparesis). States his appetite was okay up until that point, consuming 2-3 meals daily. Pt was unable to elaborate because he was waiting to speak with MD. RD left handout regarding how to eat with gastroparesis. Pt did not wish to go over handout stating " I will read through it." Discussed the importance of protein intake to promote wound healing. Pt does not like Ensure, Glucerna, or Nepro.    Pt endorses an EDW of 75 kg. He suspects he lost weight but is unsure of how much. States he normally gains a lot of fluid over the weekend but weighed in at 75.2 kg at his Monday treatment. Records indicate pt weighed 83.9 kg on 4/10 and 74 kg this admission. Suspect pt is malnourished but unable to diagnosis without NFPE and specific dietary recall.   Last phosphorus at HD center documented as 6.3.   Medications: calcitriol, sensipar, SS novolog, lantus, 5 mg reglan BID, rena-vit, renvela, sodium bicarb Labs: Na 129 (L) K 5.2 (wdl for HD), Cr 15.60 CBG 132-337   Diet Order:   Diet Order            Diet regular Room service appropriate? Yes; Fluid consistency: Thin; Fluid restriction: 1500 mL Fluid  Diet effective now               EDUCATION NEEDS:   Not appropriate for education at this time  Skin:  Skin Assessment: Skin Integrity Issues: Skin Integrity Issues:: Diabetic Ulcer Diabetic Ulcer: L foot  Last BM:  PTA  Height:   Ht Readings from Last 1 Encounters:  04/24/19 5\' 5"  (1.651 m)    Weight:   Wt Readings from Last 1 Encounters:  04/24/19 74 kg    Ideal Body Weight:  61.8 kg  BMI:  Body mass index is 27.15 kg/m.  Estimated Nutritional Needs:   Kcal:  2200-2400 kcal  Protein:  110-130 grams  Fluid:  1000 ml + UOP   Mariana Single RD, LDN Clinical Nutrition Pager # [phone]

## 2019-04-25 NOTE — Progress Notes (Signed)
Patient refused to go to dialysis at this time. Will continue to monitor.   [name]

## 2019-04-25 NOTE — Progress Notes (Signed)
Inpatient Diabetes Program Recommendations  AACE/ADA: New Consensus Statement on Inpatient Glycemic Control (2015)  Target Ranges:  Prepandial:   less than 140 mg/dL      Peak postprandial:   less than 180 mg/dL (1-2 hours)      Critically ill patients:  140 - 180 mg/dL   Lab Results  Component Value Date   GLUCAP 329 (H) 04/25/2019   HGBA1C 6.7 (H) 10/23/2018    Review of Glycemic Control Results for [name], [name] (MRN [id number]) as of 04/25/2019 13:39  Ref. Range 04/24/2019 18:36 04/24/2019 22:34 04/25/2019 03:01 04/25/2019 08:13 04/25/2019 12:03  Glucose-Capillary Latest Ref Range: 70 - 99 mg/dL 337 (H) 208 (H) 132 (H) 183 (H) 329 (H)   Diabetes history: DM  Outpatient Diabetes medications: Lantus 15 units q HS, Novolog 6-7 units tid with meals Current orders for Inpatient glycemic control:  Novolog moderate tid with meals and HS Lantus 5 units q HS Inpatient Diabetes Program Recommendations:    -May consider adding Novolog meal coverage 3 units tid with meals (hold if patient eats less than 50%).  -Also consider increasing Lantus to 8 units q HS.  -Due to ESRD, consider reducing Novolog correction to sensitive tid with meals and HS.   Thanks  Adah Perl, RN, BC-ADM Inpatient Diabetes Coordinator Pager [phone] (8a-5p)

## 2019-04-25 NOTE — Progress Notes (Signed)
Patients CBG was 481.  Went in to check patient and he stated he already taken home insulin.  Advised patient not to do that but he said "I almost died last time waiting on the nurse giving me insulin".  Calling Physician for orders.

## 2019-04-25 NOTE — Progress Notes (Signed)
Subjective:   Patient states that he continues to have abdominal pain, but feels that dilaudid helps his abdominal pain. This is the 4th day that the patient has not eaten. Expressed that he can go to HD later on in the day.   Objective:  Vital signs in last 24 hours: Vitals:   04/24/19 1815 04/24/19 1820 04/24/19 1830 04/25/19 0306  BP: (!) 83/20 (!) 120/48 (!) 119/56 133/82  Pulse: (!) 109 (!) 108 (!) 103 92  Resp: 18 14 (!) 22 18  Temp:    98.4 F (36.9 C)  TempSrc:    Oral  SpO2: 97% 97% 96% 100%  Weight:   74 kg   Height:       Physical Exam  Constitutional: He is oriented to person, place, and time. He appears well-developed and well-nourished. No distress.  HENT:  Head: Normocephalic and atraumatic.  Eyes: Conjunctivae are normal.  Cardiovascular: Normal rate, regular rhythm and normal heart sounds.  Respiratory: Effort normal and breath sounds normal. No respiratory distress. He has no wheezes.  GI: Soft. Bowel sounds are normal. He exhibits no distension. There is abdominal tenderness.  Musculoskeletal:     Comments: Left foot wrapped. Tendons visualized on dorsal aspect of right foot. No drainage seen from right foot.   Neurological: He is alert and oriented to person, place, and time.  Skin: He is not diaphoretic.  Psychiatric: His behavior is normal. Judgment and thought content normal. His mood appears anxious.   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 is a 38 year-old male with ESRD on HD MWF, HTN, Insulin-dependent diabetes mellitus, diabetic gastroparesis, and diabetic foot ulcer who presents with nausea, vomiting, confusion, and decreased appetite for the past 3 days.    Osteomyelitis as a complication of Diabetic foot ulcer  Left foot xray 7/27 showed progressive erosion of medial first metatarsal sesamoid consistent with osteomyelitis and unchanged erosion of first metatarsal head.  -continue vancomycin, day 2  -continue ceftriaxone, day 2 -Pain control with dilaudid 0.5mg  q6hrs prn  MSSA Bacteremia likely secondary to diabetic foot ulcer BCID resulted positive for MSSA 7/28 and Blood culture 7/27 growing gram positive cocci in clusters. Aerobic/anaerobic culture from 7/27 with few gram positive cocci growing.   -Started on cefazolin, day 1  Nausea, Vomiting, Abdominal Pain, Diarrhea Hx of nocturnal fecal incontinence The patient states that he is very embarrassed that he has been having nocturnal fecal incontinence. He states that it has been a huge financial burden and on his personal life in his relationships.   -advanced diet to regular with fluid restriction -blood culture 7/27, no growth<24 hrs  -c dificile toxin and pcr negative with positive antigen  -pending gi panel stool studies  -pending urine culture  -IV reglan 5mg  bid -IV phenergan -IV protonix 40mg  qd -Nutrition consult to education about gastroparesis diet    Transient acute encephalopathy Resolved. CT head without inctracranial anomalies. Thought to be due to hyponatremia and acute infection.   Hypovolemic Hyponatremia from extrarenal losses  Na 129 on admission and continues to be 129 today.  -started 16ml/hr NS for 7hrs  Hypertension The patient's blood pressure since admission has ranged 120-130/50-80s. He takes losartan 50mg  qd  at home.   -Hold losartan 50 mg daily  ESRD Goes to dialysis at [facility] on [address] MWF from left sided avf. To get HD today 7/28  -nephrology followed -continue sevlamer  -continue sensipar  Insulin-dependent diabetes mellitus Diabetic complications-gastroparesis and diabetic foot ulcer Patient's last A1c was 6.7 in January  2020, which maybe falsely low due to ESRD. At home the patient takes Lantus 15 units nightly, NovoLog 6-7 units twice daily with meals.  Patient's glucose has been ranging 180-300s over the past 24 hrs.   -Started on regular diet -Increased lantus to 8 units -Added novolog 3 units wc -ssi -Monitor glucose  Polycythemia Hb 15.9 today. Pending pathologist review. Differential with 9.3 abs neutrophils.  Dispo: Anticipated discharge in approximately 1-2 day(s).   Shane Mage, MD 04/25/2019, 8:53 AM Pager: [phone]

## 2019-04-25 NOTE — Progress Notes (Signed)
[facility] Progress Note   Subjective:   Patient seen in room. Refused HD this AM. He reports he was half asleep and did not mean to refuse, and will come to HD this afternoon if possible. Feeling better. Denies SOB, dyspnea, CP, abdominal pain. No nausea at present and reports he has not had any diarrhea for awhile. Currently on broad spectrum antibiotics for foot wound, per podiatry likely needs at least partial 1st ray amputation in the future.   Objective Vitals:   04/24/19 1815 04/24/19 1820 04/24/19 1830 04/25/19 0306  BP: (!) 83/20 (!) 120/48 (!) 119/56 133/82  Pulse: (!) 109 (!) 108 (!) 103 92  Resp: 18 14 (!) 22 18  Temp:    98.4 F (36.9 C)  TempSrc:    Oral  SpO2: 97% 97% 96% 100%  Weight:   74 kg   Height:       Physical Exam General: Well developed, alert and oriented x3, pleasant this AM. In NAD Heart: RRR, no murmur, rubs or gallops Lungs: CTA bilaterally without wheezing, rhonchi or rales Abdomen: Soft, non-tender, non-distended. +BS Extremities: LLE wrapped. No peripheral edema Dialysis Access: LUE AVG  Additional Objective Labs: Basic Metabolic Panel: Recent Labs  Lab 04/24/19 0726 04/24/19 0814 04/25/19 0842  NA 129* 128* 129*  K 4.2 4.1 5.2*  CL 86*  --  90*  CO2 21*  --  18*  GLUCOSE 131*  --  213*  BUN 60*  --  73*  CREATININE 13.86*  --  15.60*  CALCIUM 9.5  --  8.9   Liver Function Tests: Recent Labs  Lab 04/24/19 0726  AST 19  ALT 13  ALKPHOS 182*  BILITOT 1.8*  PROT 7.7  ALBUMIN 3.3*   No results for input(s): LIPASE, AMYLASE in the last 168 hours. CBC: Recent Labs  Lab 04/24/19 0726 04/24/19 0814 04/25/19 0842  WBC 12.7*  --  11.9*  NEUTROABS  --   --  9.3*  HGB 17.5* 19.0* 15.9  HCT 52.1* 56.0* 49.9  MCV 91.6  --  93.8  PLT 135*  --  159   Blood Culture    Component Value Date/Time   SDES TISSUE LEFT FOOT 04/24/2019 1939   SPECREQUEST Immunocompromised 04/24/2019 1939   CULT  04/24/2019 1939   CULTURE REINCUBATED FOR BETTER GROWTH Performed at [facility], [place] [address] [place], [place] [address]    REPTSTATUS PENDING 04/24/2019 1939    Cardiac Enzymes: No results for input(s): CKTOTAL, CKMB, CKMBINDEX, TROPONINI in the last 168 hours. CBG: Recent Labs  Lab 04/24/19 0711 04/24/19 1836 04/24/19 2234 04/25/19 0301 04/25/19 0813  GLUCAP 126* 337* 208* 132* 183*   Iron Studies: No results for input(s): IRON, TIBC, TRANSFERRIN, FERRITIN in the last 72 hours. @lablastinr3 @ Studies/Results: Ct Head W Or Wo Contrast  Result Date: 04/24/2019 CLINICAL DATA:  38 year old male with confusion, dialysis patient, possible sepsis. EXAM: CT HEAD WITHOUT AND WITH CONTRAST TECHNIQUE: Contiguous axial images were obtained from the base of the skull through the vertex without and with intravenous contrast CONTRAST:  86mL OMNIPAQUE IOHEXOL 300 MG/ML  SOLN COMPARISON:  None. FINDINGS: Brain: Incidental bulky dural calcification along the falx. No midline shift, ventriculomegaly, mass effect, evidence of mass lesion, intracranial hemorrhage or evidence of cortically based acute infarction. Gray-white matter differentiation is within normal limits throughout the brain. No abnormal enhancement identified. Vascular: Calcified atherosclerosis at the skull base. The major intracranial vascular structures appear to enhance as expected. Skull: Negative.  Sinuses/Orbits: Partially visible left maxillary retention cyst, otherwise clear. Other: Calcified scalp vessel atherosclerosis. No acute orbit or scalp soft tissue finding. IMPRESSION: 1. Normal for age CT appearance of the brain. 2. Calcified scalp vessel atherosclerosis, frequently seen in in stage renal disease/dialysis. Electronically Signed   By: Genevie Ann M.D.   On: 04/24/2019 11:05   Dg Chest Port 1 View  Result Date: 04/24/2019 CLINICAL DATA:  38 year old male dialysis patient with confusion, possible sepsis. EXAM: PORTABLE CHEST 1 VIEW  COMPARISON:  10/20/2018 and earlier. FINDINGS: Portable AP semi upright view at 0813 hours. Lung volumes and mediastinal contours remain within normal limits. Visualized tracheal air column is within normal limits. Allowing for portable technique the lungs are clear. Stable right superior mediastinum vascular stent. Increased vascular stents in left axilla since January. Stable right axillary vascular stent. No osseous abnormality identified. Negative visible bowel gas pattern. IMPRESSION: No acute cardiopulmonary abnormality. Electronically Signed   By: Genevie Ann M.D.   On: 04/24/2019 08:28   Dg Foot Complete Left  Result Date: 04/24/2019 CLINICAL DATA:  Diabetic left foot ulcer distal to the first metatarsal. Previous amputations. EXAM: LEFT FOOT - COMPLETE 3+ VIEW COMPARISON:  03/16/2019 FINDINGS: Sequelae of first toe amputation are again identified. Erosion of the first metatarsal head has not significantly progressed from the prior study, however there is progressive erosion involving the adjacent medial sesamoid bone. Mixed erosive and sclerotic changes are again seen involving the base of the proximal phalanx of the second toe which appears to reflect in part a healing fracture without evidence of progressive erosion. Sequelae of fifth ray amputation are again noted sparing only the base of the fifth metatarsal without evidence of interval osseous erosion. Diffuse soft tissue swelling in the foot is noted with atherosclerotic vascular calcifications. IMPRESSION: 1. Progressive erosion of the medial first metatarsal sesamoid consistent with osteomyelitis. 2. Unchanged erosion of the first metatarsal head. 3. Chronic erosive and sclerotic changes of the second toe proximal phalanx with evidence of a healing fracture. Electronically Signed   By: [name] Bores M.D.   On: 04/24/2019 18:23   Medications: . acyclovir    . cefTRIAXone (ROCEPHIN)  IV    . vancomycin     . aspirin  325 mg Oral QHS  .  calcitRIOL  0.25 mcg Oral Q T,Th,Sa-HD  . Chlorhexidine Gluconate Cloth  6 each Topical Q0600  . cinacalcet  60 mg Oral Q supper  . heparin  5,000 Units Subcutaneous Q8H  . insulin aspart  0-15 Units Subcutaneous TID WC  . insulin aspart  0-15 Units Subcutaneous Once  . insulin aspart  0-5 Units Subcutaneous QHS  . insulin glargine  5 Units Subcutaneous QHS  . metoCLOPramide (REGLAN) injection  5 mg Intravenous Q12H  . multivitamin  1 tablet Oral QHS  . pantoprazole (PROTONIX) IV  40 mg Intravenous Q24H  . ramelteon  8 mg Oral QHS  . sevelamer carbonate  1,600 mg Oral TID WC  . sodium chloride flush  3 mL Intravenous Once    Dialysis Orders: Center: [facility]  on MWF. Time: 4 hours. BFR 400/ DFR 800. EDW 75kg; 2K/2Ca Heparin 5000 unit bolus plus 5000 unit bolus mid run Calcitriol 0.25 mcg PO TIW during dialysis Sevelamer carbonate 800mg  5 tabs PO BID Sensipar 60mg  1 tab PO QD  Assessment/Plan: 1. Confusion: Unclear etiology. CT head without acute intracranial abnormalities. Patient was alert and oriented x 3 at the time of my exam. BUN not significantly  elevated from baseline. Per primary.  2. Fever: Temp 100.23F on presentation. Does have diabetic foot ulcer. On vancomycin, ceftriaxone and acyclovir. Seen by podiatry and likely needs amputation, but patient not interested in surgery. 3. Diarrhea: Reports diarrhea for 3 days.  COVID test negative in the ED. Denies any diarrhea at present.  Per primary. c dificile toxin and pcr negative with positive antigen, pending gi panel stool studies. 4. Hyponatremia: Possibly related to GI losses and ESRD. Encouraged to attend dialysis today.  5.  ESRD:  Normally dialyzes MWF, only got a few minutes of dialysis yesterday. No evidence of volume overload, K+ 5.2 this AM. Refused dialysis this AM but reports this was unintentional. Encouraged to attend dialysis this afternoon.  6.  Hypertension/volume: BP slightly elevated. Continue  home meds. Not volume overloaded on exam today, currently slightly below his dry weight. Will plan for 1L UF in HD. 7.  Anemia: Hgb 15.9. Not on ESA or Fe. Has been trending upward over the past several month at outpatient HD unit (was around 12 in March).  8.  Metabolic bone disease: Calcium 8.0. Will continue sensipar and calcitriol. Phosphorus pending. Last outpatient phosphorus 6.3, continue home binder. 9.  Nutrition:  Renal diet recommended 10. T2DM: On insulin.   Anice Paganini, PA-C 04/25/2019, 10:27 AM  [facility] Pager: [phone]

## 2019-04-26 ENCOUNTER — Inpatient Hospital Stay (HOSPITAL_COMMUNITY): Payer: Medicare Other

## 2019-04-26 DIAGNOSIS — B952 Enterococcus as the cause of diseases classified elsewhere: Secondary | ICD-10-CM

## 2019-04-26 DIAGNOSIS — R7881 Bacteremia: Secondary | ICD-10-CM

## 2019-04-26 LAB — GLUCOSE, CAPILLARY
Glucose-Capillary: 137 mg/dL — ABNORMAL HIGH (ref 70–99)
Glucose-Capillary: 255 mg/dL — ABNORMAL HIGH (ref 70–99)
Glucose-Capillary: 174 mg/dL — ABNORMAL HIGH (ref 70–99)
Glucose-Capillary: 276 mg/dL — ABNORMAL HIGH (ref 70–99)

## 2019-04-26 LAB — BASIC METABOLIC PANEL
Anion gap: 17 — ABNORMAL HIGH (ref 5–15)
BUN: 77 mg/dL — ABNORMAL HIGH (ref 6–20)
CO2: 20 mmol/L — ABNORMAL LOW (ref 22–32)
Calcium: 8.6 mg/dL — ABNORMAL LOW (ref 8.9–10.3)
Chloride: 93 mmol/L — ABNORMAL LOW (ref 98–111)
Creatinine, Ser: 15.56 mg/dL — ABNORMAL HIGH (ref 0.61–1.24)
GFR calc Af Amer: 4 mL/min — ABNORMAL LOW (ref >60)
GFR calc non Af Amer: 3 mL/min — ABNORMAL LOW (ref >60)
Glucose, Bld: 136 mg/dL — ABNORMAL HIGH (ref 70–99)
Potassium: 4.6 mmol/L (ref 3.5–5.1)
Sodium: 130 mmol/L — ABNORMAL LOW (ref 135–145)

## 2019-04-26 LAB — CBC
HCT: 45.5 % (ref 39.0–52.0)
Hemoglobin: 14.9 g/dL (ref 13.0–17.0)
MCH: 30 pg (ref 26.0–34.0)
MCHC: 32.7 g/dL (ref 30.0–36.0)
MCV: 91.7 fL (ref 80.0–100.0)
Platelets: 145 10*3/uL — ABNORMAL LOW (ref 150–400)
RBC: 4.96 MIL/uL (ref 4.22–5.81)
RDW: 12.9 % (ref 11.5–15.5)
WBC: 7.7 10*3/uL (ref 4.0–10.5)
nRBC: 0 % (ref 0.0–0.2)

## 2019-04-26 LAB — PATHOLOGIST SMEAR REVIEW

## 2019-04-26 LAB — ECHOCARDIOGRAM COMPLETE
Height: 65 in
Weight: 2666.68 oz

## 2019-04-26 MED ORDER — HYDROMORPHONE HCL 1 MG/ML IJ SOLN
INTRAMUSCULAR | Status: AC
  Administered 2019-04-26: 0.5 mg via INTRAVENOUS
  Filled 2019-04-26: qty 0.5

## 2019-04-26 MED ORDER — VANCOMYCIN HCL IN DEXTROSE 750-5 MG/150ML-% IV SOLN: 750.0000 mg | INTRAVENOUS | Status: DC

## 2019-04-26 MED ORDER — HEPARIN SODIUM (PORCINE) 1000 UNIT/ML IJ SOLN
INTRAMUSCULAR | Status: AC
  Administered 2019-04-26: 1000 [IU]
  Filled 2019-04-26: qty 3

## 2019-04-26 NOTE — Progress Notes (Signed)
This nurse spoke with pt. Regarding his goal and that 4L is appropriate according to his outpatient weight. Pt. Calculated predialysis weight and dry weight and agreed to change to 4L for his goal today

## 2019-04-26 NOTE — Progress Notes (Signed)
  Date: 04/26/2019  Patient name: [name]  Medical record number: [id number]  Date of birth: 13-May-1981   I have seen and evaluated this patient and I have discussed the plan of care with the house staff. Please see their note for complete details. I concur with their findings with the following additions/corrections:   Foot culture now growing enterococcus as well, ID changed to vancomycin to cover both MSSA and enterococcus. Will need 6 weeks antibiotics. Getting TTE and repeat blood cultures, he declines TEE if TTE is inconclusive. Will plan for discharge as soon as this workup is complete with plan for antibiotics administered at dialysis.   Lenice Pressman, M.D., Ph.D. 04/26/2019, 5:46 PM

## 2019-04-26 NOTE — Procedures (Signed)
Seen and examined on dialysis.  Procedure supervised.  Blood pressure 144/61 and HR 80.  He weighed 79.2 kg standing in HD unit.  4kg goal currently and tolerating.  Has been on fluids and taking PO.  Acceptable blood flow.  LUE AVF in use.    Note that he had normal saline running at 75 ml/hr on arrival to HD unit.  This was discontinued.  Not able to locate order for the fluids.    Claudia Desanctis, MD 04/26/2019 8:02 AM

## 2019-04-26 NOTE — Progress Notes (Signed)
Pharmacy Antibiotic Note  [name] is a 38 y.o. male admitted on 04/24/2019 with NVD, confusion, and decreased appetite. PMH s/f DFU s/p I&D on 04/14/2019 with noted serosanguinous drainage from plantar aspect of L foot on admit. Found to have MSSA bacteremia and antibiotics changed to cefazolin. Wound culture growing S. aureus but now also E. faecalis. Pharmacy has been consulted for vancomycin dosing.   Patient is ESRD on HD MWF. He received a vancomycin 1750mg  loading dose on 7/27 and another 750mg  on 7/28. His first HD session of this admission was today, BFR @ 400 x4 hrs. Estimated post-HD vancomycin level ~20.5.  Plan: No vancomycin for today Resume vancomycin 750mg  IV qHD MWF starting 7/31 Plan for 6 weeks IV vancomycin with HD Monitor vancomycin levels as appropriate   Height: 5\' 5"  (165.1 cm) Weight: 166 lb 10.7 oz (75.6 kg) IBW/kg (Calculated) : 61.5  Temp (24hrs), Avg:98.2 F (36.8 C), Min:97.5 F (36.4 C), Max:98.8 F (37.1 C)  Recent Labs  Lab 04/24/19 0726 04/24/19 0803 04/25/19 0842 04/26/19 0551  WBC 12.7*  --  11.9* 7.7  CREATININE 13.86*  --  15.60* 15.56*  LATICACIDVEN  --  1.3  --   --     Estimated Creatinine Clearance: 6 mL/min (A) (by C-G formula based on SCr of 15.56 mg/dL (H)).    No Known Allergies  Antibiotics this admission Ceftriaxone 7/27>>7/28 Vancomycin 7/27; 7/29 >> Acyclovir 7/27>7/28 Cefazolin 7/28>>7/29   Microbiology 7/27 BCx: S. aureus (BCID MSSA) 7/27 L foot tissue cx: S. aureus, E. faecalis 7/29 BCx: sent  [name] N. Gerarda Fraction, PharmD, [facility] Clinical Pharmacist Phone: [phone] 04/26/2019 2:15 PM

## 2019-04-26 NOTE — Progress Notes (Addendum)
Subjective: Patient seen at dialysis on rounds this morning. Pt says he is frustrated and feels overwhelmed by everything that is going on. He would like me to come back later to talk.   Returned around 3:00. Pt resting comfortably in bed. Pt says he is grateful I came back and apologized for feeling overwhelmed this AM. I reassured him it was okay. I updated him that we wanted to get the echocardiogram and repeat blood cultures to r/o endocarditis. Pt says he does not want a TEE if the TTE is inconclusive. He understands he will need IV abx for at least 6 weeks post discharge.  Objective:  Vital signs in last 24 hours: Vitals:   04/25/19 1658 04/25/19 1659 04/25/19 2141 04/26/19 0502  BP: (!) 154/88  (!) 151/85 (!) 147/65  Pulse: 93  91 90  Resp: 18  18 18   Temp:  98.2 F (36.8 C) 98.8 F (37.1 C) 98.3 F (36.8 C)  TempSrc:  Oral Oral   SpO2: 100%  100% 98%  Weight:   74.1 kg   Height:       Physical Exam Vitals signs reviewed.  Constitutional:      General: He is not in acute distress.    Appearance: Normal appearance. He is not ill-appearing, toxic-appearing or diaphoretic.  HENT:     Head: Normocephalic.  Eyes:     General: No scleral icterus.       Right eye: No discharge.        Left eye: No discharge.     Conjunctiva/sclera: Conjunctivae normal.  Cardiovascular:     Rate and Rhythm: Normal rate and regular rhythm.     Pulses: Normal pulses.     Heart sounds: Normal heart sounds. No murmur. No friction rub. No gallop.   Pulmonary:     Effort: Pulmonary effort is normal. No respiratory distress.     Breath sounds: Normal breath sounds. No wheezing or rales.  Abdominal:     General: Abdomen is flat. Bowel sounds are normal. There is no distension.     Tenderness: There is no abdominal tenderness. There is no guarding.     Comments: Firm to palpation   Musculoskeletal:        General: No swelling or deformity.     Right lower leg: No edema.  Skin:    General:  Skin is warm.     Coloration: Skin is not jaundiced.  Neurological:     Mental Status: He is alert and oriented to person, place, and time.  Psychiatric:        Mood and Affect: Mood normal.        Behavior: Behavior normal.        Thought Content: Thought content normal.        Judgment: Judgment normal.    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 is a 38 year-old male withESRD on HD MWF, HTN,T2DM, gastroparesis,and diabetic foot ulcers who initially presented with nausea, vomiting, confusion,and decreased appetite for the past 3 days prior to admission. Upon futher workup, the patient was found to have osteomyelitis of the  L foot and MSSA bacteremia.   #MSSA Bacteremia # L foot osteomyelitis: Left foot xray 7/27 showed progressive erosion of medial first metatarsal sesamoid consistent with osteomyelitis and unchanged erosion of first metatarsal head. Blood cultures on 7/27 grew MSSA. Podiatry was consulted and recommended amputation, however the patient says he does not want an amputation, and would rather go to comfort care than have surgery. We have consulted ID and will appreciate recs below: - IV Cefazolin for 6 weeks, will likely be given at dialysis in the outpatient setting - Repeate blood cultures today and obtain TTE to r/o endocarditis. Pt notes that if the TTE is inconclusive he does not want TEE.  #D3UK #Diabetic complications-gastroparesis and diabetic foot ulcer: Patient's last A1c was 6.7 in January 2020, which maybe falsely low due to ESRD. At home the patient takes Lantus 15 units nightly,  NovoLog 6-7 units twice daily with meals. - Lantus 8U at bedtime - Novolog 3 units WC - SSI  - Monitor glucose  #Nausea, Vomiting, Abdominal Pain, Diarrhea #Hx of nocturnal fecal incontinence: The patient states that he is very embarrassed that he has been having nocturnal fecal incontinence. He states that it has been a huge financial burden and on his personal life in his relationships.  -c dificile toxin and pcr negative with positive antigen, GI stool panel negative. D/c enteric precautions. -IV reglan5mg  bid -IVphenergan -IV protonix40mg  qd  #ESRD (dialysis MWF): Goes to dialysis at [facility] on [address] MWF from left sided avf. -Continue dialysis MWF  #Transient acute encephalopathy: Resolved. CT head without inctracranial anomalies. Thought to be due to initial hyponatremia and acute infection.   #Hypovolemic Hyponatremia from extrarenal losses  Na 129 on admission and stable at 130 today.  -started 87ml/hr NS for 7hrs  #HTN The patient's blood pressure since admission has ranged 120-130/50-80s. He takes losartan 50mg  qd at home. Last BP 111/52. - Continue to hold losartan 50 mg daily  #Dispo: Anticipated discharge pending ID workup. Will confirm long term abx plan with ID to make sure he can received abx during dialysis sessions.  Earlene Plater, MD Internal Medicine, PGY1 Pager: [phone]  04/26/2019,3:37 PM

## 2019-04-26 NOTE — Progress Notes (Signed)
[facility] Progress Note   Subjective:   Seen on HD. Reports he just got his pain medicine and wants to sleep. Denies SOB, cough, CP, edema. Still reporting abdominal pain "7/10" and diarrhea. No nausea at present. Refused HD yesterday, now back on regular MWF schedule.  Was on IV fluids at arrival to HD which have been discontinued.   Objective Vitals:   04/25/19 1658 04/25/19 1659 04/25/19 2141 04/26/19 0502  BP: (!) 154/88  (!) 151/85 (!) 147/65  Pulse: 93  91 90  Resp: 18  18 18   Temp:  98.2 F (36.8 C) 98.8 F (37.1 C) 98.3 F (36.8 C)  TempSrc:  Oral Oral   SpO2: 100%  100% 98%  Weight:   74.1 kg   Height:       Physical Exam General: Well developed, alert and oriented x3, limited ROS because he says he just wants to sleep. In NAD Heart: RRR, no murmur, rubs or gallops Lungs: CTA bilaterally without wheezing, rhonchi or rales Abdomen: Soft, non-tender, non-distended. +BS Extremities: LLE wrapped. No peripheral edema Dialysis Access: LUE AVG cannulated  Additional Objective Labs: Basic Metabolic Panel: Recent Labs  Lab 04/24/19 0726 04/24/19 0814 04/25/19 0842 04/26/19 0551  NA 129* 128* 129* 130*  K 4.2 4.1 5.2* 4.6  CL 86*  --  90* 93*  CO2 21*  --  18* 20*  GLUCOSE 131*  --  213* 136*  BUN 60*  --  73* 77*  CREATININE 13.86*  --  15.60* 15.56*  CALCIUM 9.5  --  8.9 8.6*   Liver Function Tests: Recent Labs  Lab 04/24/19 0726  AST 19  ALT 13  ALKPHOS 182*  BILITOT 1.8*  PROT 7.7  ALBUMIN 3.3*   CBC: Recent Labs  Lab 04/24/19 0726 04/24/19 0814 04/25/19 0842 04/26/19 0551  WBC 12.7*  --  11.9* 7.7  NEUTROABS  --   --  9.3*  --   HGB 17.5* 19.0* 15.9 14.9  HCT 52.1* 56.0* 49.9 45.5  MCV 91.6  --  93.8 91.7  PLT 135*  --  159 145*   Blood Culture    Component Value Date/Time   SDES TISSUE LEFT FOOT 04/24/2019 1939   SPECREQUEST Immunocompromised 04/24/2019 1939   CULT  04/24/2019 1939    CULTURE REINCUBATED FOR BETTER  GROWTH Performed at [facility], [address], [place],  [address]    REPTSTATUS PENDING 04/24/2019 1939    CBG: Recent Labs  Lab 04/25/19 0301 04/25/19 0813 04/25/19 1203 04/25/19 1656 04/25/19 2142  GLUCAP 132* 183* 329* 481* 189*    Studies/Results: Ct Head W Or Wo Contrast  Result Date: 04/24/2019 CLINICAL DATA:  38 year old male with confusion, dialysis patient, possible sepsis. EXAM: CT HEAD WITHOUT AND WITH CONTRAST TECHNIQUE: Contiguous axial images were obtained from the base of the skull through the vertex without and with intravenous contrast CONTRAST:  67mL OMNIPAQUE IOHEXOL 300 MG/ML  SOLN COMPARISON:  None. FINDINGS: Brain: Incidental bulky dural calcification along the falx. No midline shift, ventriculomegaly, mass effect, evidence of mass lesion, intracranial hemorrhage or evidence of cortically based acute infarction. Gray-white matter differentiation is within normal limits throughout the brain. No abnormal enhancement identified. Vascular: Calcified atherosclerosis at the skull base. The major intracranial vascular structures appear to enhance as expected. Skull: Negative. Sinuses/Orbits: Partially visible left maxillary retention cyst, otherwise clear. Other: Calcified scalp vessel atherosclerosis. No acute orbit or scalp soft tissue finding. IMPRESSION: 1. Normal for age CT appearance  of the brain. 2. Calcified scalp vessel atherosclerosis, frequently seen in in stage renal disease/dialysis. Electronically Signed   By: Genevie Ann M.D.   On: 04/24/2019 11:05   Dg Chest Port 1 View  Result Date: 04/24/2019 CLINICAL DATA:  38 year old male dialysis patient with confusion, possible sepsis. EXAM: PORTABLE CHEST 1 VIEW COMPARISON:  10/20/2018 and earlier. FINDINGS: Portable AP semi upright view at 0813 hours. Lung volumes and mediastinal contours remain within normal limits. Visualized tracheal air column is within normal limits. Allowing for portable technique the  lungs are clear. Stable right superior mediastinum vascular stent. Increased vascular stents in left axilla since January. Stable right axillary vascular stent. No osseous abnormality identified. Negative visible bowel gas pattern. IMPRESSION: No acute cardiopulmonary abnormality. Electronically Signed   By: Genevie Ann M.D.   On: 04/24/2019 08:28   Dg Foot Complete Left  Result Date: 04/24/2019 CLINICAL DATA:  Diabetic left foot ulcer distal to the first metatarsal. Previous amputations. EXAM: LEFT FOOT - COMPLETE 3+ VIEW COMPARISON:  03/16/2019 FINDINGS: Sequelae of first toe amputation are again identified. Erosion of the first metatarsal head has not significantly progressed from the prior study, however there is progressive erosion involving the adjacent medial sesamoid bone. Mixed erosive and sclerotic changes are again seen involving the base of the proximal phalanx of the second toe which appears to reflect in part a healing fracture without evidence of progressive erosion. Sequelae of fifth ray amputation are again noted sparing only the base of the fifth metatarsal without evidence of interval osseous erosion. Diffuse soft tissue swelling in the foot is noted with atherosclerotic vascular calcifications. IMPRESSION: 1. Progressive erosion of the medial first metatarsal sesamoid consistent with osteomyelitis. 2. Unchanged erosion of the first metatarsal head. 3. Chronic erosive and sclerotic changes of the second toe proximal phalanx with evidence of a healing fracture. Electronically Signed   By: [name] Bores M.D.   On: 04/24/2019 18:23   Medications: .  ceFAZolin (ANCEF) IV     . aspirin  325 mg Oral QHS  . calcitRIOL  0.25 mcg Oral Q T,Th,Sa-HD  . Chlorhexidine Gluconate Cloth  6 each Topical Q0600  . cinacalcet  60 mg Oral Q supper  . feeding supplement (PRO-STAT SUGAR FREE 64)  30 mL Oral BID  . heparin  5,000 Units Subcutaneous Q8H  . insulin aspart  0-15 Units Subcutaneous TID WC  .  insulin aspart  0-5 Units Subcutaneous QHS  . insulin aspart  3 Units Subcutaneous TID WC  . insulin glargine  8 Units Subcutaneous QHS  . metoCLOPramide (REGLAN) injection  5 mg Intravenous Q12H  . multivitamin  1 tablet Oral QHS  . pantoprazole (PROTONIX) IV  40 mg Intravenous Q24H  . ramelteon  8 mg Oral QHS  . sevelamer carbonate  1,600 mg Oral TID WC  . sodium bicarbonate  1,300 mg Oral BID  . sodium chloride flush  3 mL Intravenous Once    [facility] 4 hours. BFR 400/ DFR 800. EDW 75kg; 2K/2Ca Heparin 5000 unit bolus plus 5000 unit bolus mid run Calcitriol 0.25 mcg PO TIW during dialysis Sevelamer carbonate 800mg  5 tabs PO BID Sensipar 60mg  1 tab PO QD  Assessment/Plan: 1. Confusion:Unclear etiology. CT head without acute intracranial abnormalities. Patient was alert and oriented x 3 every time I have seen him this admission. BUN not significantly elevated from baseline. Per primary.  2. Osteomyelitis L foot:Temp 100.22F on presentation. Does have diabetic foot ulcer.  Initially on vancomycin, ceftriaxone and acyclovir. Seen by podiatry and likely needs amputation, but patient not interested in surgery. Afebrile at present. Will need outpatient wound care. Blood culture positive for MSSA, cefazolin per primary team. 3. Diarrhea:Reported diarrhea for 3 days. COVID test negative in the ED. Denies any diarrhea at present.  Per primary. c dificile toxin and pcr negative with positive antigen, pending gi panel stool studies. Supportive treatment per primary team.  4. Hyponatremia: Possibly related to GI losses and ESRD.Refused dialysis yesterday, now back on schedule. 5. ESRD:Normally dialyzes MWF, only got a few minutes of dialysis on 7/27 and refused HD yesterday. No evidence of volume overload, K+ 4.6 this AM. Next HD 7/31.  6. Hypertension/volume:BP slightly elevated. Continue home meds. Not volume overloaded on exam today,  currently slightly below his dry weight. Will plan for 1L UF in HD. 7. Anemia:Hgb 14.9. Not on ESA or Fe. Has been trending upward over the past several month at outpatient HD unit (was around 12 in March). 8. Metabolic bone disease:Calcium 8.6. Will continue sensipar and calcitriol. Phosphorus pending. Last outpatient phosphorus 6.3, continue home binder. 9. Nutrition:Renal diet recommended 10. T2DM: On insulin.  Anice Paganini, PA-C 04/26/2019, 8:10 AM  [employer] Pager: [phone]

## 2019-04-26 NOTE — Progress Notes (Signed)
Pt. System clotted 40 minutes remaining treatment time. tx paused new setup required pt. Refused states "take me off" Anice Paganini PA made aware at bedside ok to terminate treatment

## 2019-04-26 NOTE — Progress Notes (Signed)
Orthopedic Tech Progress Note Patient Details:  NICOLES SEDLACEK 1981-02-12 [id number]  Ortho Devices Type of Ortho Device: CAM walker Ortho Device/Splint Location: LLE Ortho Device/Splint Interventions: Application, Adjustment, Ordered   Post Interventions Patient Tolerated: Well Instructions Provided: Care of device, Adjustment of device   Janit Pagan 04/26/2019, 5:55 PM

## 2019-04-26 NOTE — Progress Notes (Signed)
[facility]  Date of Admission:  04/24/2019     Total days of antibiotics 3         ASSESSMENT/PLAN  [name] is a 38 year old male admitted with gastrointestinal symptoms of nausea/vomiting/diarrhea along with a diabetic foot ulcer of the left foot and found to have osteomyelitis of the left first ray status post bedside incision and drainage cultures now positive for Staphylococcus aureus with sensitivities pending.  Blood cultures also positive for MSSA with likely source being his left foot.  Gastrointestinal symptoms appear to have resolved and eating adequately.  Left first ray osteomyelitis -surgical specimens now growing Staphylococcus aureus most likely MSSA.  Changed to Ancef with good tolerability.  Podiatry discussed likely need for amputation with [name] wishing to continue with antibiotic therapy at present.  Concern remains for ability to resolve infection given dead bone being left in the foot at the completion of treatment despite possible sterility.  We will plan for 6 weeks of IV cefazolin with dialysis.  MSSA bacteremia -repeat blood cultures ordered today.  Obtain TTE to rule out endocarditis.  Most likely source at the present time is his left foot given Staphylococcus aureus growing in surgical cultures.  Fistula appears without evidence of infection and functioning appropriately.  Continue current dose of cefazolin with dialysis.  Encephalopathy -now resolved.  Unclear origin although infection remains a possibility.  Type 1 diabetes -discussed importance of maintaining blood sugar control and following podiatry recommendations for foot care as this places him at significantly increased risk for further complicated healing and increases risk of further infection.     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 aspirin  325 mg Oral QHS  . calcitRIOL  0.25 mcg Oral Q T,Th,Sa-HD  . Chlorhexidine Gluconate Cloth  6 each Topical Q0600  . cinacalcet  60 mg Oral Q supper  . feeding supplement (PRO-STAT SUGAR FREE 64)  30 mL Oral BID  . heparin  5,000 Units Subcutaneous Q8H  . insulin aspart  0-15 Units Subcutaneous TID WC  . insulin aspart  0-5 Units Subcutaneous QHS  . insulin aspart  3 Units Subcutaneous TID WC  . insulin glargine  8 Units Subcutaneous QHS  . metoCLOPramide (REGLAN) injection  5 mg Intravenous Q12H  . multivitamin  1 tablet Oral QHS  . pantoprazole (PROTONIX) IV  40 mg Intravenous Q24H  . ramelteon  8 mg Oral QHS  . sevelamer carbonate  1,600 mg Oral TID WC  . sodium bicarbonate  1,300 mg Oral BID  . sodium chloride flush  3 mL Intravenous Once    SUBJECTIVE:  Afebrile overnight with most recent white blood cell count of 7.7.  Blood cultures positive for MSSA bacteremia.  He completed dialysis earlier this morning.  Bedside surgical cultures growing Staphylococcus aureus with with sensitivities pending.  No acute events overnight.  No Known Allergies   Review of Systems: Review of Systems  Constitutional: Negative for chills, fever and weight loss.  Respiratory: Negative for cough, shortness of breath and wheezing.   Cardiovascular: Negative for chest pain and leg swelling.  Gastrointestinal: Negative for  abdominal pain, constipation, diarrhea, nausea and vomiting.  Skin: Negative for rash.      OBJECTIVE: Vitals:   04/26/19 0830 04/26/19 0900 04/26/19 0930 04/26/19 1000  BP: (!) 109/38 (!) 120/56 (!) 141/61 (!) 122/49  Pulse: 82 86 89 87  Resp:      Temp:      TempSrc:      SpO2:      Weight:      Height:       Body mass index is 29.06 kg/m.   Physical Exam Constitutional:      General: He is not in acute distress.    Appearance: He is well-developed.  Cardiovascular:     Rate and Rhythm: Normal rate and regular rhythm.     Heart sounds: Normal heart sounds.  Pulmonary:     Effort: Pulmonary effort is normal.     Breath sounds: Normal breath sounds.  Musculoskeletal:     Comments: Surgical dressing on left foot is clean and dry. Distal pulses and sensation are intact and appropriate.   Skin:    General: Skin is warm and dry.  Neurological:     Mental Status: He is alert and oriented to person, place, and time.  Psychiatric:        Mood and Affect: Mood normal.     Lab Results Lab Results  Component Value Date   WBC 7.7 04/26/2019   HGB 14.9 04/26/2019   HCT 45.5 04/26/2019   MCV 91.7 04/26/2019   PLT 145 (L) 04/26/2019    Lab Results  Component Value Date   CREATININE 15.56 (H) 04/26/2019   BUN 77 (H) 04/26/2019   NA 130 (L) 04/26/2019   K 4.6 04/26/2019   CL 93 (L) 04/26/2019   CO2 20 (L) 04/26/2019    Lab Results  Component Value Date   ALT 13 04/24/2019   AST 19 04/24/2019   ALKPHOS 182 (H) 04/24/2019   BILITOT 1.8 (H) 04/24/2019     Microbiology: Recent Results (from the past 240 hour(s))  Blood Culture (routine x 2)     Status: None (Preliminary result)   Collection Time: 04/24/19  8:01 AM   Specimen: BLOOD  Result Value Ref Range Status   Specimen Description BLOOD RIGHT ANTECUBITAL  Final   Special Requests   Final    BOTTLES DRAWN AEROBIC AND ANAEROBIC Blood Culture adequate volume   Culture   Final    NO GROWTH 2 DAYS Performed at [facility], [address], [place], [place] [address]    Report Status PENDING  Incomplete  SARS Coronavirus 2 (CEPHEID - Performed in [facility] [facility] lab), Hosp Order     Status: None   Collection Time: 04/24/19  8:06 AM   Specimen: Nasopharyngeal Swab  Result Value Ref Range Status   SARS Coronavirus 2 NEGATIVE NEGATIVE Final    Comment:  (NOTE) If result is NEGATIVE SARS-CoV-2 target nucleic acids are NOT DETECTED. The SARS-CoV-2 RNA is generally detectable in upper and lower  respiratory specimens during the acute phase of infection. The lowest  concentration of SARS-CoV-2 viral copies this assay can detect is 250  copies / mL. A negative result does not preclude SARS-CoV-2 infection  and should not be used as the sole basis for treatment or other  patient management decisions.  A negative result may occur with  improper specimen collection / handling, submission of specimen other  than nasopharyngeal swab, presence of viral mutation(s) within the  areas targeted  by this assay, and inadequate number of viral copies  (<250 copies / mL). A negative result must be combined with clinical  observations, patient history, and epidemiological information. If result is POSITIVE SARS-CoV-2 target nucleic acids are DETECTED. The SARS-CoV-2 RNA is generally detectable in upper and lower  respiratory specimens dur ing the acute phase of infection.  Positive  results are indicative of active infection with SARS-CoV-2.  Clinical  correlation with patient history and other diagnostic information is  necessary to determine patient infection status.  Positive results do  not rule out bacterial infection or co-infection with other viruses. If result is PRESUMPTIVE POSTIVE SARS-CoV-2 nucleic acids MAY BE PRESENT.   A presumptive positive result was obtained on the submitted specimen  and confirmed on repeat testing.  While 2019 novel coronavirus  (SARS-CoV-2) nucleic acids may be present in the submitted sample  additional confirmatory testing may be necessary for epidemiological  and / or clinical management purposes  to differentiate between  SARS-CoV-2 and other Sarbecovirus currently known to infect humans.  If clinically indicated additional testing with an alternate test  methodology [phone]) is advised. The SARS-CoV-2 RNA is  generally  detectable in upper and lower respiratory sp ecimens during the acute  phase of infection. The expected result is Negative. Fact Sheet for Patients:  StrictlyIdeas.no Fact Sheet for Healthcare Providers: BankingDealers.co.za This test is not yet approved or cleared by the [place] FDA and has been authorized for detection and/or diagnosis of SARS-CoV-2 by FDA under an Emergency Use Authorization (EUA).  This EUA will remain in effect (meaning this test can be used) for the duration of the COVID-19 declaration under Section 564(b)(1) of the Act, 21 U.S.C. section 360bbb-3(b)(1), unless the authorization is terminated or revoked sooner. Performed at [facility], [place] [address], [place], [place] [address]   C difficile quick scan w PCR reflex     Status: Abnormal   Collection Time: 04/24/19  8:19 AM   Specimen: Nasopharyngeal Swab; Stool  Result Value Ref Range Status   C Diff antigen POSITIVE (A) NEGATIVE Final   C Diff toxin NEGATIVE NEGATIVE Final   C Diff interpretation Results are indeterminate. See PCR results.  Final    Comment: Performed at [facility], [place] [address] [place], [place] [address]  C. Diff by PCR, Reflexed     Status: None   Collection Time: 04/24/19  8:19 AM  Result Value Ref Range Status   Toxigenic C. Difficile by PCR NEGATIVE NEGATIVE Final    Comment: Patient is colonized with non toxigenic C. difficile. May not need treatment unless significant symptoms are present. Performed at [facility], [place] [address] [place], [place] [address]   Blood Culture (routine x 2)     Status: Abnormal (Preliminary result)   Collection Time: 04/24/19  9:15 AM   Specimen: BLOOD  Result Value Ref Range Status   Specimen Description BLOOD SITE NOT SPECIFIED  Final   Special Requests   Final    BOTTLES DRAWN AEROBIC AND ANAEROBIC Blood Culture adequate volume   Culture  Setup Time   Final     GRAM POSITIVE COCCI IN CLUSTERS AEROBIC BOTTLE ONLY Organism ID to follow CRITICAL RESULT CALLED TO, READ BACK BY AND VERIFIED WITH: Cristopher Estimable PharmD 14:45 04/25/19 ([email or web address])    Culture (A)  Final    STAPHYLOCOCCUS AUREUS SUSCEPTIBILITIES TO FOLLOW Performed at [facility], [place] [address], [place], [place] [address]  Report Status PENDING  Incomplete  Blood Culture ID Panel (Reflexed)     Status: Abnormal   Collection Time: 04/24/19  9:15 AM  Result Value Ref Range Status   Enterococcus species NOT DETECTED NOT DETECTED Final   Listeria monocytogenes NOT DETECTED NOT DETECTED Final   Staphylococcus species DETECTED (A) NOT DETECTED Final    Comment: CRITICAL RESULT CALLED TO, READ BACK BY AND VERIFIED WITH: Cristopher Estimable PharmD 14:45 04/25/19 ([email or web address])    Staphylococcus aureus (BCID) DETECTED (A) NOT DETECTED Final    Comment: Methicillin (oxacillin) susceptible Staphylococcus aureus (MSSA). Preferred therapy is anti staphylococcal beta lactam antibiotic (Cefazolin or Nafcillin), unless clinically contraindicated. CRITICAL RESULT CALLED TO, READ BACK BY AND VERIFIED WITH: Cristopher Estimable PharmD 14:45 04/25/19 ([email or web address])    Methicillin resistance NOT DETECTED NOT DETECTED Final   Streptococcus species NOT DETECTED NOT DETECTED Final   Streptococcus agalactiae NOT DETECTED NOT DETECTED Final   Streptococcus pneumoniae NOT DETECTED NOT DETECTED Final   Streptococcus pyogenes NOT DETECTED NOT DETECTED Final   Acinetobacter baumannii NOT DETECTED NOT DETECTED Final   Enterobacteriaceae species NOT DETECTED NOT DETECTED Final   Enterobacter cloacae complex NOT DETECTED NOT DETECTED Final   Escherichia coli NOT DETECTED NOT DETECTED Final   Klebsiella oxytoca NOT DETECTED NOT DETECTED Final   Klebsiella pneumoniae NOT DETECTED NOT DETECTED Final   Proteus species NOT DETECTED NOT DETECTED Final   Serratia marcescens NOT DETECTED NOT DETECTED Final   Haemophilus influenzae NOT  DETECTED NOT DETECTED Final   Neisseria meningitidis NOT DETECTED NOT DETECTED Final   Pseudomonas aeruginosa NOT DETECTED NOT DETECTED Final   Candida albicans NOT DETECTED NOT DETECTED Final   Candida glabrata NOT DETECTED NOT DETECTED Final   Candida krusei NOT DETECTED NOT DETECTED Final   Candida parapsilosis NOT DETECTED NOT DETECTED Final   Candida tropicalis NOT DETECTED NOT DETECTED Final    Comment: Performed at [facility], [address] [place], [place] [address]  Gastrointestinal Panel by PCR , Stool     Status: None   Collection Time: 04/24/19  4:20 PM   Specimen: Stool  Result Value Ref Range Status   Campylobacter species NOT DETECTED NOT DETECTED Final   Plesimonas shigelloides NOT DETECTED NOT DETECTED Final   Salmonella species NOT DETECTED NOT DETECTED Final   Yersinia enterocolitica NOT DETECTED NOT DETECTED Final   Vibrio species NOT DETECTED NOT DETECTED Final   Vibrio cholerae NOT DETECTED NOT DETECTED Final   Enteroaggregative E coli (EAEC) NOT DETECTED NOT DETECTED Final   Enteropathogenic E coli (EPEC) NOT DETECTED NOT DETECTED Final   Enterotoxigenic E coli (ETEC) NOT DETECTED NOT DETECTED Final   Shiga like toxin producing E coli (STEC) NOT DETECTED NOT DETECTED Final   Shigella/Enteroinvasive E coli (EIEC) NOT DETECTED NOT DETECTED Final   Cryptosporidium NOT DETECTED NOT DETECTED Final   Cyclospora cayetanensis NOT DETECTED NOT DETECTED Final   Entamoeba histolytica NOT DETECTED NOT DETECTED Final   Giardia lamblia NOT DETECTED NOT DETECTED Final   Adenovirus F40/41 NOT DETECTED NOT DETECTED Final   Astrovirus NOT DETECTED NOT DETECTED Final   Norovirus GI/GII NOT DETECTED NOT DETECTED Final   Rotavirus A NOT DETECTED NOT DETECTED Final   Sapovirus (I, II, IV, and V) NOT DETECTED NOT DETECTED Final    Comment: Performed at [facility], [address] [place], [place] [address]  Aerobic/Anaerobic Culture (surgical/deep wound)      Status: None (Preliminary result)   Collection Time: 04/24/19  7:39 PM   Specimen: Tissue  Result Value Ref Range Status   Specimen Description TISSUE LEFT FOOT  Final   Special Requests Immunocompromised  Final   Gram Stain   Final    NO WBC SEEN FEW GRAM POSITIVE COCCI Performed at [facility], [address] [place], [place] [address]    Culture   Final    MODERATE STAPHYLOCOCCUS AUREUS CULTURE REINCUBATED FOR BETTER GROWTH NO ANAEROBES ISOLATED; CULTURE IN PROGRESS FOR 5 DAYS    Report Status PENDING  Incomplete     Terri Piedra, NP [facility] [phone] Pager  04/26/2019  11:20 AM

## 2019-04-26 NOTE — Progress Notes (Signed)
Prior to treatment pt. Standing weight 79.2kg. pt states "my outpatient weight 75.4kg" pt. Request to challenge ordered goal 1L. To 5L. Dr. Royce Macadamia made aware Dr. Royce Macadamia states ok to challenge to 4L. Pt refused and wants to keep 5L. Dr. Royce Macadamia will come and assess. Pt. Also receiving NS at 74ml/hr. Dr. Royce Macadamia ordered to stop NS fluids that she doesn't see any current orders.

## 2019-04-26 NOTE — Progress Notes (Signed)
  Echocardiogram 2D Echocardiogram has been performed.  Darlina Sicilian M 04/26/2019, 3:00 PM

## 2019-04-27 DIAGNOSIS — E861 Hypovolemia: Secondary | ICD-10-CM

## 2019-04-27 DIAGNOSIS — I700 Atherosclerosis of aorta: Secondary | ICD-10-CM

## 2019-04-27 LAB — CULTURE, BLOOD (ROUTINE X 2): Special Requests: ADEQUATE

## 2019-04-27 LAB — GLUCOSE, CAPILLARY
Glucose-Capillary: 149 mg/dL — ABNORMAL HIGH (ref 70–99)
Glucose-Capillary: 53 mg/dL — ABNORMAL LOW (ref 70–99)
Glucose-Capillary: 177 mg/dL — ABNORMAL HIGH (ref 70–99)
Glucose-Capillary: 209 mg/dL — ABNORMAL HIGH (ref 70–99)
Glucose-Capillary: 270 mg/dL — ABNORMAL HIGH (ref 70–99)
Glucose-Capillary: 284 mg/dL — ABNORMAL HIGH (ref 70–99)

## 2019-04-27 MED ORDER — VANCOMYCIN HCL IN DEXTROSE 750-5 MG/150ML-% IV SOLN: 750.0000 mg | INTRAVENOUS | 0 refills | Status: DC

## 2019-04-27 MED ORDER — DEXTROSE 50 % IV SOLN: 25.0000 g | INTRAVENOUS | Status: AC

## 2019-04-27 MED ORDER — CHLORHEXIDINE GLUCONATE CLOTH 2 % EX PADS: 6.0000 | MEDICATED_PAD | Freq: Every day | CUTANEOUS | Status: DC

## 2019-04-27 MED ORDER — DEXTROSE 50 % IV SOLN
INTRAVENOUS | Status: AC
  Administered 2019-04-27: 50 mL
  Filled 2019-04-27: qty 50

## 2019-04-27 NOTE — Discharge Summary (Addendum)
Name: [name] MRN: [id number] DOB: [birth year] 38 y.o. PCP: Albertine Patricia, MD  Date of Admission: 04/24/2019  7:07 AM Date of Discharge:  04/27/2019 Attending Physician: Oda Kilts, MD  Discharge Diagnosis: 1. Left foot osteomyelitis/MSSA bacteremia 2. ESRD (HD MWF) 3. Chronic bowel incontinence 4. T1DM   Discharge Medications: Allergies as of 04/27/2019   No Known Allergies      Medication List     STOP taking these medications    ceFAZolin 2-4 GM/100ML-% IVPB Commonly known as: ANCEF       TAKE these medications    aspirin 325 MG tablet Take 325 mg by mouth at bedtime.   calcitRIOL 0.5 MCG capsule Commonly known as: ROCALTROL Take 5 capsules (2.5 mcg total) by mouth every Monday, Wednesday, and Friday with hemodialysis.   cinacalcet 30 MG tablet Commonly known as: SENSIPAR Take 30 mg by mouth daily with supper.   clonazePAM 0.5 MG tablet Commonly known as: KLONOPIN Take 0.5 mg by mouth at bedtime.   collagenase ointment Commonly known as: Santyl Apply 1 application topically daily.   collagenase ointment Commonly known as: Santyl Apply 1 application topically daily. 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   HYDROcodone-acetaminophen 5-325 MG tablet Commonly known as: NORCO/VICODIN Take 1-2 tablets by mouth every 6 (six) hours as needed for moderate pain or severe pain.   insulin aspart 100 UNIT/ML injection Commonly known as: novoLOG Inject 6-7 Units into the skin 2 (two) times daily with a meal.   insulin glargine 100 UNIT/ML injection Commonly known as: LANTUS Inject 0.15 mLs (15 Units total) into the skin at bedtime.   [employer] TEST STRIPS test strip Generic drug: glucose blood by Other route 4 (four) times a day as needed.   lactobacillus acidophilus & bulgar chewable tablet Chew 1 tablet by mouth 3  (three) times daily with meals.   losartan 50 MG tablet Commonly known as: COZAAR Take 50 mg by mouth daily.   metoCLOPramide 5 MG tablet Commonly known as: REGLAN Take 5 mg by mouth 3 (three) times daily before meals.   multivitamin Tabs tablet Take 1 tablet by mouth at bedtime.   ondansetron 4 MG disintegrating tablet Commonly known as: Zofran ODT Take 1 tablet (4 mg total) by mouth every 8 (eight) hours as needed for nausea or vomiting.   oxyCODONE 5 MG immediate release tablet Commonly known as: Oxy IR/ROXICODONE Take 5 mg by mouth every 4 (four) hours as needed for severe pain.   promethazine 25 MG tablet Commonly known as: PHENERGAN Take 25 mg by mouth every 6 (six) hours as needed for nausea or vomiting.   sevelamer carbonate 800 MG tablet Commonly known as: RENVELA Take 2,400 mg by mouth See admin instructions. Take 5 times a day and two times a day with snacks   sildenafil 100 MG tablet Commonly known as: VIAGRA Take 100 mg by mouth daily as needed for erectile dysfunction.   silver sulfADIAZINE 1 % cream Commonly known as: Silvadene Apply pea-sized amount to wound daily.   traMADol 50 MG tablet Commonly known as: ULTRAM Take 50 mg by mouth 2 (two) times daily.   Vancomycin 750-5 MG/150ML-% Soln Commonly known as: VANCOCIN Inject 150 mLs (750 mg total) into the vein every Monday, Wednesday, and Friday with hemodialysis. Start taking on: April 28, 2019  Discharge Care Instructions  (From admission, onward)           Start     Ordered   04/27/19 0000  Discharge wound care:    Comments: Please wear your surgical shoes. It is important so your foot wounds can heal.   04/27/19 1315            Disposition and follow-up:   [name] was discharged from [facility] in fair condition.  At the hospital follow up visit please address:  1.  Left foot osteomyelitis/MSSA bacteremia: Podiatry recommended  amputation, however patient said he did not want amputation. TTE done in-house could not rule out endocarditis.  Patient declined TEE despite our recommendations.  ID recommended IV vancomycin for 6 weeks and will be given during dialysis sessions and follow up in outpatient setting. He needs close follow up with ID, podiatry, and needs to establish with a PCP  2. ESRD (HD MWF): Goes to dialysis at [facility] on [facility]. MWF from left sided AVF. Continued HD during hospitalization. Needs 6w IV vancomycin, for osteo and will be given at HD days.  3. Chronic bowel incontinence: Patient stated he is very embarrassed for having nocturnal fecal incontinence for the last several years. Has put a large strain on his quality of life.  Has been seen by [facility] in the 2018 but was inconsistent with follow-up. We encouraged close follow up with GI to continue evaluation  4. T1DM: Last A1c was in January 2020 and was 6.7. Believed to be falsely low due to ESRD. Patient had bilateral advanced stage foot ulcers and peripheral neuropathy. Does not wear surgical shoes.  5.  Labs / imaging needed at time of follow-up: CBC, BMP  6.  Pending labs/ test needing follow-up: Blood cultures  Follow-up Appointments: Follow-up Information     Golden Circle, FNP Follow up.   Specialties: Family Medicine, Infectious Diseases Why: 06/01/2019 at 10:00 am. If you are not able to make this appointment please let us know.  Contact information: [address] [place] [address] [phone]         Genice Rouge. Go to.   Why: You have a PCP appointment with [name] at 3:50 PM on Augsut 10th. Please call the number provided if you aren't able to make this appointment. Contact information: [facility] [address], [place],  [address] [place] Hospital Course by problem list: 1. Left foot osteomyelitis/MSSA bacteremia: Patient  initially presented from dialysis with altered mental status. It was noted that he mentioned that he wanted to see "the ball game".  In the ED the patient was febrile, tachycardic, and normotensive. He was started on IV vancomycin, ceftriaxone, acyclovir, and IV fluids. Left foot Xray revealed progressive erosion of medial 1st metatarsal consistent with osteomyelitis.  Podiatry was consulted and ultimately recommended amputation. The patient is not amendable to this plan.  Blood cultures grew MSSA, so he was narrowed to cefazolin. Subsequently, cultures of the foot grew MSSA and Enterococcus. ID was consulted and changed antibiotic regimen to vancomycin, recommended 6 weeks of antibiotics. TTE was obtained and was notable for calcified lesions on aortic valve, however vegetations could not be excluded. TEE was recommended for further characterization of the lesions but the patient declined. The decision was made to continue vancomycin for at least 6 weeks and should be administered at dialysis. Plan to follow-up with  ID on 8/6. Repeat blood cultures obtained prior to discharge have no growth to date.   2. ESRD (HD MWF): Patient goes to dialysis at [facility] on [facility]. MWF from L sided AVF.  We continued his dialysis treatments during his hospital stay.  He will continue dialysis post discharge with the plan of giving him IV vancomycin at dialysis days for at least 6 weeks.  ID is directing this.  3. Bowel incontinence: Patient states that he has been embarrassed by nocturnal fecal incontinence over the last several years. It is put a large financial burden on his quality of life.  He described his stools as loose and associated with nausea and abdominal pain.  Has to wear an adult diaper.  C. difficile toxin and PCR were negative with positive antigen. GI stool panel was negative. Plan for continued follow up with GI  4. T1DM: Hx of extensive diabetic complications including gastroparesis, bilateral advanced  staged diabetic foot ulcers and neuropathy. His last A1c was 6.7 in January 2020, which is presumed to be falsely low due to ESRD.  At home the patient takes Lantus 15 units nightly, NovoLog 6 to 7 units twice daily with meals.  During his hospitalization, he received Lantus 8 units at bedtime and NovoLog 3 units with meals in addition to sliding scale insulin.  Patient had intermittent nausea likely secondary to gastroparesis.  He was given IV Reglan, Phenergan, and Protonix during his stay. Referral to PCP, Shane Mire, NP, was scheduled on 8/10.  Discharge Vitals:   BP (!) 148/65 (BP Location: Right Arm)   Pulse 90   Temp 97.8 F (36.6 C) (Oral)   Resp 18   Ht 5\' 5"  (1.651 m)   Wt 75.6 kg   SpO2 100%   BMI 27.73 kg/m   Pertinent Labs, Studies, and Procedures:   CT Head W & W/o Contrast: IMPRESSION: 1. Normal for age CT appearance of the brain. 2. Calcified scalp vessel atherosclerosis, frequently seen in in stage renal disease/dialysis.  L foot XR: IMPRESSION: 1. Progressive erosion of the medial first metatarsal sesamoid consistent with osteomyelitis. 2. Unchanged erosion of the first metatarsal head. 3. Chronic erosive and sclerotic changes of the second toe proximal phalanx with evidence of a healing fracture.  Transthoracic Echocardiogram: IMPRESSIONS: 1. The left ventricle has normal systolic function with an ejection fraction of 60-65%. The cavity size was normal. There is mild asymmetric left ventricular hypertrophy. Left ventricular diastolic Doppler parameters are consistent with impaired relaxation. No evidence of left ventricular regional wall motion abnormalities.  2. The right ventricle has normal systolic function. The cavity was normal. There is no increase in right ventricular wall thickness.  3. The aortic valve is tricuspid. Mild sclerosis of the aortic valve. Moderate aortic annular calcification noted. There is a focal calcified density that appears to  emanate from the aortic annulus in the vicinty of the left and non coronary cusps. This likely represents degenerative calcific disease but cannot rule out vegetation. Recommend TEE for further evaluation given MSSA bacteremia.  4. The aorta is normal in size and structure.  CBC Latest Ref Rng & Units 04/26/2019 04/25/2019 04/24/2019  WBC 4.0 - 10.5 K/uL 7.7 11.9(H) -  Hemoglobin 13.0 - 17.0 g/dL 14.9 15.9 19.0(H)  Hematocrit 39.0 - 52.0 % 45.5 49.9 56.0(H)  Platelets 150 - 400 K/uL 145(L) 159 -   BMP Latest Ref Rng & Units 04/26/2019 04/25/2019 04/24/2019  Glucose 70 - 99 mg/dL 136(H) 213(H) -  BUN 6 -  20 mg/dL 77(H) 73(H) -  Creatinine 0.61 - 1.24 mg/dL 15.56(H) 15.60(H) -  Sodium 135 - 145 mmol/L 130(L) 129(L) 128(L)  Potassium 3.5 - 5.1 mmol/L 4.6 5.2(H) 4.1  Chloride 98 - 111 mmol/L 93(L) 90(L) -  CO2 22 - 32 mmol/L 20(L) 18(L) -  Calcium 8.9 - 10.3 mg/dL 8.6(L) 8.9 -   GI Panel by PCR, stool: No organisms detected  Blood cultures 7/27: MSSA detected  Aerobic/Anaerobic Cultures (surgical/deep wound) L foot 7/27: Staphylococcus aureus, Enterococcus facialis  Blood cultures 7/29: No growth to date  Discharge Instructions: Discharge Instructions     Call MD for:   Complete by: As directed    Call MD for:  difficulty breathing, headache or visual disturbances   Complete by: As directed    Call MD for:  extreme fatigue   Complete by: As directed    Call MD for:  hives   Complete by: As directed    Call MD for:  persistant dizziness or light-headedness   Complete by: As directed    Call MD for:  persistant nausea and vomiting   Complete by: As directed    Call MD for:  redness, tenderness, or signs of infection (pain, swelling, redness, odor or green/yellow discharge around incision site)   Complete by: As directed    Call MD for:  severe uncontrolled pain   Complete by: As directed    Call MD for:  temperature >100.4   Complete by: As directed    Diet - low sodium heart  healthy   Complete by: As directed    Discharge instructions   Complete by: As directed    Thank you for allowing [place] to take care of you during your hospitalization. Below is a summary of what we treated:  1. Osteomyelitis - You have a bone infection of your foot. You will need to take antibiotics to treat it. You will take the antibiotic (Vancomycin) every Monday, Wednesday, and Friday during dialysis. You will need to take this medicine for at least 6 more weeks  Please let us know if you have any questions or concerns.   Discharge wound care:   Complete by: As directed    Please wear your surgical shoes. It is important so your foot wounds can heal.   Increase activity slowly   Complete by: As directed    Leave dressing on - Keep it clean, dry, and intact until clinic visit   Complete by: As directed       Signed: Earlene Plater, MD Internal Medicine, PGY1 Pager: [phone]  04/30/2019,10:08 AM

## 2019-04-27 NOTE — Plan of Care (Signed)
  Problem: Education: Goal: Knowledge of General Education information will improve Description: Including pain rating scale, medication(s)/side effects and non-pharmacologic comfort measures Outcome: Progressing   Problem: Pain Managment: Goal: General experience of comfort will improve Outcome: Progressing   

## 2019-04-27 NOTE — Progress Notes (Signed)
Inpatient Diabetes Program Recommendations  AACE/ADA: New Consensus Statement on Inpatient Glycemic Control (2015)  Target Ranges:  Prepandial:   less than 140 mg/dL      Peak postprandial:   less than 180 mg/dL (1-2 hours)      Critically ill patients:  140 - 180 mg/dL   Lab Results  Component Value Date   GLUCAP 270 (H) 04/27/2019   HGBA1C 6.7 (H) 10/23/2018    Review of Glycemic Control Results for ELAZAR, ARGABRIGHT (MRN [id number]) as of 04/27/2019 12:46  Ref. Range 04/27/2019 03:31 04/27/2019 04:00 04/27/2019 05:42 04/27/2019 07:27 04/27/2019 11:08  Glucose-Capillary Latest Ref Range: 70 - 99 mg/dL 53 (L) 149 (H) 177 (H) 209 (H) 270 (H)   Diabetes history: DM  Outpatient Diabetes medications: Lantus 15 units q HS, Novolog 6-7 units tid with meals Current orders for Inpatient glycemic control:  Novolog moderate tid with meals and HS Lantus 5 units q HS + Novolog 3 units tid meal coverage  Inpatient Diabetes Program Recommendations:   -Decrease Novolog correction to sensitive tid  Thank you, Nani Gasser. Nirali Magouirk, RN, MSN, CDE  Diabetes Coordinator Inpatient Glycemic Control Team Team Pager [phone] (8am-5pm) 04/27/2019 12:49 PM

## 2019-04-27 NOTE — Progress Notes (Signed)
Spoke with patient this morning not to take his insulin while I the hospital that we will give him the proper dose. Patient states that he won't take it. However, patient states that if it is not given to me on time I will go ahead and take it. He was educated on the risks of taking his own insulin.

## 2019-04-27 NOTE — Progress Notes (Signed)
[facility] Progress Note   Subjective:   Patient standing in hallway outside of his room, says he wants double portions for breakfast. Nurse is aware. Returned to room for exam. He denies SOB, dyspnea, CP, palpitations, abdominal pain, nausea. Reports he continues to have diarrhea. Dialysis machine clotted yesterday with 40 min left in treatment.   Objective Vitals:   04/26/19 1142 04/26/19 1636 04/26/19 2133 04/27/19 0638  BP: (!) 111/52 (!) 102/52 132/76 125/60  Pulse: 90 84 95 90  Resp: 18 18 18 18   Temp: 98.2 F (36.8 C) 98.4 F (36.9 C) 98.3 F (36.8 C) 97.9 F (36.6 C)  TempSrc: Oral Oral Oral Oral  SpO2:  98% 99% 100%  Weight:      Height:       Physical Exam General:Well developed, alert and oriented x3, walking around. In NAD Heart:RRR, no murmur, rubs or gallops Lungs:CTA bilaterally without wheezing, rhonchi or rales Abdomen:Soft, non-tender, non-distended. +BS Extremities:LLE in boot. No peripheral edema Dialysis Access:LUE AVG + thrill   Additional Objective Labs: Basic Metabolic Panel: Recent Labs  Lab 04/24/19 0726 04/24/19 0814 04/25/19 0842 04/26/19 0551  NA 129* 128* 129* 130*  K 4.2 4.1 5.2* 4.6  CL 86*  --  90* 93*  CO2 21*  --  18* 20*  GLUCOSE 131*  --  213* 136*  BUN 60*  --  73* 77*  CREATININE 13.86*  --  15.60* 15.56*  CALCIUM 9.5  --  8.9 8.6*   Liver Function Tests: Recent Labs  Lab 04/24/19 0726  AST 19  ALT 13  ALKPHOS 182*  BILITOT 1.8*  PROT 7.7  ALBUMIN 3.3*   CBC: Recent Labs  Lab 04/24/19 0726 04/24/19 0814 04/25/19 0842 04/26/19 0551  WBC 12.7*  --  11.9* 7.7  NEUTROABS  --   --  9.3*  --   HGB 17.5* 19.0* 15.9 14.9  HCT 52.1* 56.0* 49.9 45.5  MCV 91.6  --  93.8 91.7  PLT 135*  --  159 145*   Blood Culture    Component Value Date/Time   SDES BLOOD RIGHT ANTECUBITAL 04/26/2019 1529   SPECREQUEST  04/26/2019 1529    BOTTLES DRAWN AEROBIC ONLY Blood Culture adequate volume   CULT   04/26/2019 1529    NO GROWTH < 24 HOURS Performed at [facility], [place] [address], [place], [place] [address]    REPTSTATUS PENDING 04/26/2019 1529   CBG: Recent Labs  Lab 04/26/19 2138 04/27/19 0331 04/27/19 0400 04/27/19 0542 04/27/19 0727  GLUCAP 276* 53* 149* 177* 209*   Medications: . [START ON 04/28/2019] vancomycin     . aspirin  325 mg Oral QHS  . calcitRIOL  0.25 mcg Oral Q T,Th,Sa-HD  . Chlorhexidine Gluconate Cloth  6 each Topical Q0600  . cinacalcet  60 mg Oral Q supper  . feeding supplement (PRO-STAT SUGAR FREE 64)  30 mL Oral BID  . heparin  5,000 Units Subcutaneous Q8H  . insulin aspart  0-15 Units Subcutaneous TID WC  . insulin aspart  0-5 Units Subcutaneous QHS  . insulin aspart  3 Units Subcutaneous TID WC  . insulin glargine  8 Units Subcutaneous QHS  . metoCLOPramide (REGLAN) injection  5 mg Intravenous Q12H  . multivitamin  1 tablet Oral QHS  . pantoprazole (PROTONIX) IV  40 mg Intravenous Q24H  . ramelteon  8 mg Oral QHS  . sevelamer carbonate  1,600 mg Oral TID WC  . sodium chloride flush  3 mL Intravenous Once    [facility]: [facility]: 4 hours. BFR 400/ DFR 800. EDW 75kg; 2K/2Ca Heparin 5000 unit bolus plus 5000 unit bolus mid run Calcitriol 0.25 mcg PO TIW during dialysis Sevelamer carbonate 800mg  5 tabs PO BID Sensipar 60mg  1 tab PO QD  Assessment/Plan: 1. Osteomyelitis L foot:Temp 100.1F on presentation. Does have diabetic foot ulcer. Initially on vancomycin, ceftriaxone and acyclovir.Seen by podiatry and likely needs amputation, but patient not interested in surgery. Afebrile at present. Will need outpatient wound care. Blood culture positive for MSSA, will need prolonged antibiotic course per primary team. 2. Diarrhea:Reported diarrhea for 3 days. COVID test negative in the ED.Denies any diarrhea at present.Per primary. c dificile toxin and pcr negative with positive antigen,pending gi  panel stool studies. Supportive treatment per primary team.  3. Hyponatremia: Possibly related to GI losses and ESRD.Refused dialysis 7/28, now back on schedule. Improved to 130.  4.ESRD:Normally dialyzes MWF, only got a few minutes of dialysison 7/27 and refused HD 7/28. No evidence of volume overload, K+4.6 this AM.Next HD 7/31. Will need increased heparin dose due to clotting. 5. Hypertension/volume:BP controlled. Continue home meds. Not volume overloaded on exam today, currently slightly above his dry weight. Will plan for 2-3L UF in HD. 6. Anemia:Hgb14.9. Not on ESA or Fe. Has been trending upward over the past several month at outpatient HD unit (was around 12 in March). 7. Metabolic bone YTRZNBV:APOLIDC3.0. Will continue sensipar and calcitriol. Phosphorus pending. Last outpatient phosphorus 6.3, continue home binder. 8. Nutrition:Renal diet with fluid restrictions recommended 9. T2DM: On insulin.  Anice Paganini, PA-C 04/27/2019, 9:03 AM  [facility] Pager: [phone]

## 2019-04-27 NOTE — Progress Notes (Signed)
Patient said he administered 4 units of insulin he brought from home to himself in the evening. This morning, patient had  hypoglycemia episode with blood sugar of 53, became confused and hypoglycemia protocol was initiated by this nurse. Dr. Gilford Rile, MD on call notified. Will continue to monitor.  [name]

## 2019-04-27 NOTE — Progress Notes (Signed)
[facility] for Infectious Disease  Date of Admission:  04/24/2019     Total days of antibiotics 4         ASSESSMENT/PLAN  Mr. Deike is a 38 year old male admitted with gastrointestinal symptoms of nausea/vomiting/diarrhea along with a diabetic foot ulcer of the left foot and found to have osteomyelitis of the left first ray status post bedside I&D with surgical specimens positive for Staphylococcus aureus and Enterococcus faecalis.  Sensitivities remain pending.  Blood cultures also positive for MSSA.  TTE with a focal calcified density that appears to emanate from the aortic annulus likely representing calcific disease but cannot rule out vegetation.  Valvular heart function maintained.  Gastrointestinal symptoms have since resolved and encephalopathy initially present on admission has cleared.  Left first ray osteomyelitis -cultures positive for Staphylococcus aureus and Enterococcus faecalis.  Podiatry with recommendations for amputation which would provide source control.  Mr. Memmer is not amenable to that at present.  Plan to treat for 6 weeks with vancomycin during dialysis.  Discussed importance of attending dialysis to receive medication through IV. Continue to monitor cultures. End date tentatively planned for 06/07/19.   MSSA bacteremia -most likely source being his left foot which is now polymicrobial.  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  Plan to treat for 6 weeks so no further indication for TEE at this time.  Plan to recheck TTE following completion of treatment.  Repeat blood cultures are without growth in less than 24 hours.  Continue current dose of vancomycin.  Nausea/vomiting/diarrhea -resolved at present.  Eating and drinking well.  Type 1 diabetes -continue working on improving blood sugars and discussed importance of continued foot care with podiatry as well as daily inspections of feet.  Continue blood sugar management per primary team.   Ok for discharge from ID standpoint. Will arrange follow up in ID office.    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 aspirin  325 mg Oral QHS  . calcitRIOL  0.25 mcg Oral Q T,Th,Sa-HD  . Chlorhexidine Gluconate Cloth  6 each Topical Q0600  . cinacalcet  60 mg Oral Q supper  . feeding supplement (PRO-STAT SUGAR FREE 64)  30 mL Oral BID  . heparin  5,000 Units Subcutaneous Q8H  . insulin aspart  0-15 Units Subcutaneous TID WC  . insulin aspart  0-5 Units Subcutaneous QHS  . insulin aspart  3 Units Subcutaneous TID WC  . insulin glargine  8 Units Subcutaneous QHS  . metoCLOPramide (REGLAN) injection  5 mg Intravenous Q12H  . multivitamin  1 tablet Oral QHS  . pantoprazole (PROTONIX) IV  40 mg Intravenous Q24H  . ramelteon  8 mg Oral QHS  . sevelamer carbonate  1,600 mg Oral TID WC  . sodium chloride flush  3 mL Intravenous Once    SUBJECTIVE:  Afebrile overnight with no new concerns.  Repeat blood cultures drawn without growth in less than 24 hours.  Bedside I&D cultures positive for Enterococcus faecalis with antibiotics changed from Ancef to vancomycin. Had lower blood sugars last night. Eating breakfast currently.   No Known Allergies   Review of  Systems: Review of Systems  Constitutional: Negative for chills, fever and weight loss.  Respiratory: Negative for cough, shortness of breath and wheezing.   Cardiovascular: Negative for chest pain and leg swelling.  Gastrointestinal: Negative for abdominal pain, constipation, diarrhea, nausea and vomiting.  Skin: Negative for rash.      OBJECTIVE: Vitals:   04/26/19 1636 04/26/19 2133  04/27/19 0638 04/27/19 0950  BP: (!) 102/52 132/76 125/60 (!) 148/65  Pulse: 84 95 90   Resp: 18 18 18 18   Temp: 98.4 F (36.9 C) 98.3 F (36.8 C) 97.9 F (36.6 C) 97.8 F (36.6 C)  TempSrc: Oral Oral Oral Oral  SpO2: 98% 99% 100%   Weight:      Height:       Body mass index is 27.73 kg/m.  Physical Exam Constitutional:      General: He is not in acute distress.    Appearance: He is well-developed.  Cardiovascular:     Rate and Rhythm: Normal rate and regular rhythm.     Heart sounds: Normal heart sounds.  Pulmonary:     Effort: Pulmonary effort is normal.     Breath sounds: Normal breath sounds.  Skin:    General: Skin is warm and dry.  Neurological:     Mental Status: He is alert and oriented to person, place, and time.  Psychiatric:        Behavior: Behavior normal.        Thought Content: Thought content normal.        Judgment: Judgment normal.     Lab Results Lab Results  Component Value Date   WBC 7.7 04/26/2019   HGB 14.9 04/26/2019   HCT 45.5 04/26/2019   MCV 91.7 04/26/2019   PLT 145 (L) 04/26/2019    Lab Results  Component Value Date   CREATININE 15.56 (H) 04/26/2019   BUN 77 (H) 04/26/2019   NA 130 (L) 04/26/2019   K 4.6 04/26/2019   CL 93 (L) 04/26/2019   CO2 20 (L) 04/26/2019    Lab Results  Component Value Date   ALT 13 04/24/2019   AST 19 04/24/2019   ALKPHOS 182 (H) 04/24/2019   BILITOT 1.8 (H) 04/24/2019     Microbiology: Recent Results (from the past 240 hour(s))  Blood Culture (routine x 2)     Status: None (Preliminary result)   Collection Time: 04/24/19  8:01 AM   Specimen: BLOOD  Result Value Ref Range Status   Specimen Description BLOOD RIGHT ANTECUBITAL  Final   Special Requests   Final    BOTTLES DRAWN AEROBIC AND ANAEROBIC Blood Culture adequate volume   Culture   Final    NO GROWTH 3 DAYS Performed at [facility], [address], [place], [place] [address]    Report Status PENDING  Incomplete  SARS  Coronavirus 2 (CEPHEID - Performed in [facility] hospital lab), Hosp Order     Status: None   Collection Time: 04/24/19  8:06 AM   Specimen: Nasopharyngeal Swab  Result Value Ref Range Status   SARS Coronavirus 2 NEGATIVE NEGATIVE Final    Comment: (NOTE) If result is NEGATIVE SARS-CoV-2 target nucleic acids are NOT DETECTED. The SARS-CoV-2 RNA is generally detectable in upper and lower  respiratory specimens during the acute phase of infection. The lowest  concentration of SARS-CoV-2 viral copies this assay can detect is 250  copies / mL. A negative result does not preclude SARS-CoV-2 infection  and should not be used  as the sole basis for treatment or other  patient management decisions.  A negative result may occur with  improper specimen collection / handling, submission of specimen other  than nasopharyngeal swab, presence of viral mutation(s) within the  areas targeted by this assay, and inadequate number of viral copies  (<250 copies / mL). A negative result must be combined with clinical  observations, patient history, and epidemiological information. If result is POSITIVE SARS-CoV-2 target nucleic acids are DETECTED. The SARS-CoV-2 RNA is generally detectable in upper and lower  respiratory specimens dur ing the acute phase of infection.  Positive  results are indicative of active infection with SARS-CoV-2.  Clinical  correlation with patient history and other diagnostic information is  necessary to determine patient infection status.  Positive results do  not rule out bacterial infection or co-infection with other viruses. If result is PRESUMPTIVE POSTIVE SARS-CoV-2 nucleic acids MAY BE PRESENT.   A presumptive positive result was obtained on the submitted specimen  and confirmed on repeat testing.  While 2019 novel coronavirus  (SARS-CoV-2) nucleic acids may be present in the submitted sample  additional confirmatory testing may be necessary for epidemiological  and / or  clinical management purposes  to differentiate between  SARS-CoV-2 and other Sarbecovirus currently known to infect humans.  If clinically indicated additional testing with an alternate test  methodology [phone]) is advised. The SARS-CoV-2 RNA is generally  detectable in upper and lower respiratory sp ecimens during the acute  phase of infection. The expected result is Negative. Fact Sheet for Patients:  StrictlyIdeas.no Fact Sheet for Healthcare Providers: BankingDealers.co.za This test is not yet approved or cleared by the [place] FDA and has been authorized for detection and/or diagnosis of SARS-CoV-2 by FDA under an Emergency Use Authorization (EUA).  This EUA will remain in effect (meaning this test can be used) for the duration of the COVID-19 declaration under Section 564(b)(1) of the Act, 21 U.S.C. section 360bbb-3(b)(1), unless the authorization is terminated or revoked sooner. Performed at [facility], [place] [address], [place], [place] [address]   C difficile quick scan w PCR reflex     Status: Abnormal   Collection Time: 04/24/19  8:19 AM   Specimen: Nasopharyngeal Swab; Stool  Result Value Ref Range Status   C Diff antigen POSITIVE (A) NEGATIVE Final   C Diff toxin NEGATIVE NEGATIVE Final   C Diff interpretation Results are indeterminate. See PCR results.  Final    Comment: Performed at [facility], [place] [address], [place], [place] [address]  C. Diff by PCR, Reflexed     Status: None   Collection Time: 04/24/19  8:19 AM  Result Value Ref Range Status   Toxigenic C. Difficile by PCR NEGATIVE NEGATIVE Final    Comment: Patient is colonized with non toxigenic C. difficile. May not need treatment unless significant symptoms are present. Performed at [facility], [place] [address], [place], [place] [address]   Blood Culture (routine x 2)     Status: Abnormal   Collection Time: 04/24/19  9:15 AM    Specimen: BLOOD  Result Value Ref Range Status   Specimen Description BLOOD SITE NOT SPECIFIED  Final   Special Requests   Final    BOTTLES DRAWN AEROBIC AND ANAEROBIC Blood Culture adequate volume   Culture  Setup Time   Final    GRAM POSITIVE COCCI IN CLUSTERS AEROBIC BOTTLE ONLY CRITICAL RESULT CALLED TO, READ BACK BY AND VERIFIED WITH:  Cristopher Estimable PharmD 14:45 04/25/19 ([email or web address]) Performed at [facility], [place] [address], [place], [place] [address]    Culture STAPHYLOCOCCUS AUREUS (A)  Final   Report Status 04/27/2019 FINAL  Final   Organism ID, Bacteria STAPHYLOCOCCUS AUREUS  Final      Susceptibility   Staphylococcus aureus - MIC*    CIPROFLOXACIN <=0.5 SENSITIVE Sensitive     ERYTHROMYCIN <=0.25 SENSITIVE Sensitive     GENTAMICIN <=0.5 SENSITIVE Sensitive     OXACILLIN <=0.25 SENSITIVE Sensitive     TETRACYCLINE <=1 SENSITIVE Sensitive     VANCOMYCIN 1 SENSITIVE Sensitive     TRIMETH/SULFA <=10 SENSITIVE Sensitive     CLINDAMYCIN <=0.25 SENSITIVE Sensitive     RIFAMPIN <=0.5 SENSITIVE Sensitive     Inducible Clindamycin NEGATIVE Sensitive     * STAPHYLOCOCCUS AUREUS  Blood Culture ID Panel (Reflexed)     Status: Abnormal   Collection Time: 04/24/19  9:15 AM  Result Value Ref Range Status   Enterococcus species NOT DETECTED NOT DETECTED Final   Listeria monocytogenes NOT DETECTED NOT DETECTED Final   Staphylococcus species DETECTED (A) NOT DETECTED Final    Comment: CRITICAL RESULT CALLED TO, READ BACK BY AND VERIFIED WITH: Cristopher Estimable PharmD 14:45 04/25/19 ([email or web address])    Staphylococcus aureus (BCID) DETECTED (A) NOT DETECTED Final    Comment: Methicillin (oxacillin) susceptible Staphylococcus aureus (MSSA). Preferred therapy is anti staphylococcal beta lactam antibiotic (Cefazolin or Nafcillin), unless clinically contraindicated. CRITICAL RESULT CALLED TO, READ BACK BY AND VERIFIED WITH: Cristopher Estimable PharmD 14:45 04/25/19 ([email or web address])    Methicillin resistance NOT  DETECTED NOT DETECTED Final   Streptococcus species NOT DETECTED NOT DETECTED Final   Streptococcus agalactiae NOT DETECTED NOT DETECTED Final   Streptococcus pneumoniae NOT DETECTED NOT DETECTED Final   Streptococcus pyogenes NOT DETECTED NOT DETECTED Final   Acinetobacter baumannii NOT DETECTED NOT DETECTED Final   Enterobacteriaceae species NOT DETECTED NOT DETECTED Final   Enterobacter cloacae complex NOT DETECTED NOT DETECTED Final   Escherichia coli NOT DETECTED NOT DETECTED Final   Klebsiella oxytoca NOT DETECTED NOT DETECTED Final   Klebsiella pneumoniae NOT DETECTED NOT DETECTED Final   Proteus species NOT DETECTED NOT DETECTED Final   Serratia marcescens NOT DETECTED NOT DETECTED Final   Haemophilus influenzae NOT DETECTED NOT DETECTED Final   Neisseria meningitidis NOT DETECTED NOT DETECTED Final   Pseudomonas aeruginosa NOT DETECTED NOT DETECTED Final   Candida albicans NOT DETECTED NOT DETECTED Final   Candida glabrata NOT DETECTED NOT DETECTED Final   Candida krusei NOT DETECTED NOT DETECTED Final   Candida parapsilosis NOT DETECTED NOT DETECTED Final   Candida tropicalis NOT DETECTED NOT DETECTED Final    Comment: Performed at [facility], [address], [place], [place] [address]  Gastrointestinal Panel by PCR , Stool     Status: None   Collection Time: 04/24/19  4:20 PM   Specimen: Stool  Result Value Ref Range Status   Campylobacter species NOT DETECTED NOT DETECTED Final   Plesimonas shigelloides NOT DETECTED NOT DETECTED Final   Salmonella species NOT DETECTED NOT DETECTED Final   Yersinia enterocolitica NOT DETECTED NOT DETECTED Final   Vibrio species NOT DETECTED NOT DETECTED Final   Vibrio cholerae NOT DETECTED NOT DETECTED Final   Enteroaggregative E coli (EAEC) NOT DETECTED NOT DETECTED Final   Enteropathogenic E coli (EPEC) NOT DETECTED NOT DETECTED Final   Enterotoxigenic E coli (ETEC) NOT DETECTED NOT DETECTED Final   Shiga like toxin producing  E  coli (STEC) NOT DETECTED NOT DETECTED Final   Shigella/Enteroinvasive E coli (EIEC) NOT DETECTED NOT DETECTED Final   Cryptosporidium NOT DETECTED NOT DETECTED Final   Cyclospora cayetanensis NOT DETECTED NOT DETECTED Final   Entamoeba histolytica NOT DETECTED NOT DETECTED Final   Giardia lamblia NOT DETECTED NOT DETECTED Final   Adenovirus F40/41 NOT DETECTED NOT DETECTED Final   Astrovirus NOT DETECTED NOT DETECTED Final   Norovirus GI/GII NOT DETECTED NOT DETECTED Final   Rotavirus A NOT DETECTED NOT DETECTED Final   Sapovirus (I, II, IV, and V) NOT DETECTED NOT DETECTED Final    Comment: Performed at [facility], [place] [place], [place] [address]  Aerobic/Anaerobic Culture (surgical/deep wound)     Status: None (Preliminary result)   Collection Time: 04/24/19  7:39 PM   Specimen: Tissue  Result Value Ref Range Status   Specimen Description TISSUE LEFT FOOT  Final   Special Requests Immunocompromised  Final   Gram Stain   Final    NO WBC SEEN FEW GRAM POSITIVE COCCI Performed at [facility], [place] [address] [place], [place] [address]    Culture   Final    MODERATE STAPHYLOCOCCUS AUREUS FEW ENTEROCOCCUS FAECALIS NO ANAEROBES ISOLATED; CULTURE IN PROGRESS FOR 5 DAYS    Report Status PENDING  Incomplete  Culture, blood (routine x 2)     Status: None (Preliminary result)   Collection Time: 04/26/19  3:25 PM   Specimen: BLOOD  Result Value Ref Range Status   Specimen Description BLOOD RIGHT ANTECUBITAL  Final   Special Requests   Final    BOTTLES DRAWN AEROBIC ONLY Blood Culture adequate volume   Culture   Final    NO GROWTH < 24 HOURS Performed at [facility], [place] [address] [place], [place] [address]    Report Status PENDING  Incomplete  Culture, blood (routine x 2)     Status: None (Preliminary result)   Collection Time: 04/26/19  3:29 PM   Specimen: BLOOD  Result Value Ref Range Status   Specimen Description BLOOD RIGHT ANTECUBITAL   Final   Special Requests   Final    BOTTLES DRAWN AEROBIC ONLY Blood Culture adequate volume   Culture   Final    NO GROWTH < 24 HOURS Performed at [facility], [place] [address] [place], [place] [address]    Report Status PENDING  Incomplete     Terri Piedra, [place] for [place] [phone] Pager  04/27/2019  10:07 AM

## 2019-04-27 NOTE — Progress Notes (Signed)
DISCHARGE NOTE HOME [name] to be discharged Home per MD order. Discussed prescriptions and follow up appointments with the patient. Prescriptions given to patient; medication list explained in detail. Patient verbalized understanding.  Skin clean, dry and intact without evidence of skin break down, no evidence of skin tears noted. IV catheter discontinued intact. Site without signs and symptoms of complications. Dressing and pressure applied. Pt denies pain at the site currently. No complaints noted.  Patient free of lines, drains, and wounds.   An After Visit Summary (AVS) was printed and given to the patient. Patient escorted via wheelchair, and discharged home via private auto.  Arlyss Repress, RN

## 2019-04-27 NOTE — Progress Notes (Addendum)
Subjective: Pt seen at the bedside on rounds this AM. Dressed in street clothes and standing at the bedside. We explained to him the TTE results could not rule out endocarditis, and it was recommended that we would pursue a TEE.  The patient voiced that he understood, but is declining doing this procedure.  He would rather be discharged and continue receiving antibiotics at his dialysis sessions.  He also asks if we can set him up with a primary care provider as well as a GI referral for bowel incontinence.  Objective:  Vital signs in last 24 hours: Vitals:   04/26/19 1636 04/26/19 2133 04/27/19 0638 04/27/19 0950  BP: (!) 102/52 132/76 125/60 (!) 148/65  Pulse: 84 95 90   Resp: 18 18 18 18   Temp: 98.4 F (36.9 C) 98.3 F (36.8 C) 97.9 F (36.6 C) 97.8 F (36.6 C)  TempSrc: Oral Oral Oral Oral  SpO2: 98% 99% 100%   Weight:      Height:       Physical Exam Vitals signs reviewed.  Constitutional:      General: He is not in acute distress.    Appearance: Normal appearance. He is not ill-appearing, toxic-appearing or diaphoretic.  HENT:     Head: Normocephalic and atraumatic.  Eyes:     General: No scleral icterus.       Right eye: No discharge.        Left eye: No discharge.     Extraocular Movements: Extraocular movements intact.  Cardiovascular:     Rate and Rhythm: Normal rate and regular rhythm.     Pulses: Normal pulses.     Heart sounds: Normal heart sounds. No murmur. No friction rub. No gallop.   Pulmonary:     Effort: Pulmonary effort is normal. No respiratory distress.     Breath sounds: Normal breath sounds. No wheezing.  Abdominal:     General: Abdomen is flat. Bowel sounds are normal. There is no distension.     Palpations: Abdomen is soft.     Tenderness: There is no abdominal tenderness. There is no guarding.  Musculoskeletal:        General: Tenderness present. No swelling.     Comments: LLE wrapped in kerlix and ACE wrap  Skin:    General: Skin is  warm.     Coloration: Skin is not jaundiced.     Findings: No bruising.  Neurological:     General: No focal deficit present.     Mental Status: He is alert and oriented to person, place, and time. Mental status is at baseline.     Coordination: Coordination abnormal.     Gait: Gait normal.  Psychiatric:        Mood and Affect: Mood normal.    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 Upon futher workup, the patient was found to have osteomyelitis of the L foot and MSSA bacteremia. He is now  status post bedside I&D with surgical specimens positive for Staphylococcus aureus and Enterococcus faecalis.  #MSSA Bacteremia # L foot osteomyelitis: Left foot xray 7/27 showed progressive erosion of medial first metatarsal sesamoid consistent with osteomyelitis and unchanged erosion of first metatarsal head. Blood cultures on 7/27 grew MSSA. Podiatry was consulted and recommended amputation, however the patient says he does not want an amputation, and would rather go to comfort care than have surgery. We have consulted ID and will appreciate recs below: - IV Vancomycin for 6 weeks, will likely be given at dialysis in the outpatient setting -  TTE could not r/o endocarditis. Pt declined TEE despite explaining why it was recommended.  #R4WN #Diabeticcomplications-gastroparesis and diabetic foot ulcer: Patient's  last A1c was 6.7 in January 2020, which maybe falsely low due to ESRD.At home the patienttakesLantus 15 units nightly, NovoLog 6-7 units twice daily with meals. - Lantus 8U at bedtime - Novolog 3 units WC - SSI  - Monitor glucose  #Nausea, Vomiting,Abdominal Pain,Diarrhea #Hx of nocturnal fecal incontinence: The patient states that he is very embarrassed that he has been having nocturnal fecal incontinence. He states that it has been a huge financial burden and on his personal life in his relationships.  -c dificile toxin and pcr negative with positive antigen, GI stool panel negative.  -IV reglan5mg  bid -IVphenergan -IV protonix40mg  qd  #ESRD (dialysis MWF): Goes to dialysis at [facility] on [address] MWF from left sided avf. -Continue dialysis MWF - Will receive IV Vanc at dialysis in the outpatient for at least 6 w. ID directing this.   #Transient acute encephalopathy: Resolved. CT head without inctracranial anomalies. Thought to be due to initial hyponatremia and acute infection.  #HypovolemicHyponatremiafrom extrarenal losses  Na129on admission and stable  -started 23ml/hr NS for 7hrs  #HTN The patient's blood pressure since admission has ranged120-130/50-80s. Hetakes losartan 50mg  qd at home. Last BP 111/52. - Continue to holdlosartan 50 mg daily  #Dispo: Anticipated discharge today. ID to f/u in the outpatient setting and set up Vancomycin dosing.   Earlene Plater, MD Internal Medicine, PGY1 Pager: [phone]  04/27/2019,2:54 PM

## 2019-04-28 ENCOUNTER — Ambulatory Visit: Payer: Medicare Other | Admitting: Podiatry

## 2019-04-28 ENCOUNTER — Other Ambulatory Visit: Payer: Self-pay

## 2019-04-28 ENCOUNTER — Ambulatory Visit (INDEPENDENT_AMBULATORY_CARE_PROVIDER_SITE_OTHER): Payer: Medicare Other | Admitting: Podiatry

## 2019-04-28 ENCOUNTER — Telehealth: Payer: Self-pay | Admitting: *Deleted

## 2019-04-28 DIAGNOSIS — E08621 Diabetes mellitus due to underlying condition with foot ulcer: Secondary | ICD-10-CM | POA: Diagnosis not present

## 2019-04-28 DIAGNOSIS — E1022 Type 1 diabetes mellitus with diabetic chronic kidney disease: Secondary | ICD-10-CM | POA: Diagnosis not present

## 2019-04-28 DIAGNOSIS — N2581 Secondary hyperparathyroidism of renal origin: Secondary | ICD-10-CM | POA: Diagnosis not present

## 2019-04-28 DIAGNOSIS — D509 Iron deficiency anemia, unspecified: Secondary | ICD-10-CM | POA: Diagnosis not present

## 2019-04-28 DIAGNOSIS — L97424 Non-pressure chronic ulcer of left heel and midfoot with necrosis of bone: Secondary | ICD-10-CM | POA: Diagnosis not present

## 2019-04-28 DIAGNOSIS — A4101 Sepsis due to Methicillin susceptible Staphylococcus aureus: Secondary | ICD-10-CM | POA: Diagnosis not present

## 2019-04-28 DIAGNOSIS — M86672 Other chronic osteomyelitis, left ankle and foot: Secondary | ICD-10-CM

## 2019-04-28 DIAGNOSIS — Z9119 Patient's noncompliance with other medical treatment and regimen: Secondary | ICD-10-CM

## 2019-04-28 DIAGNOSIS — N186 End stage renal disease: Secondary | ICD-10-CM | POA: Diagnosis not present

## 2019-04-28 DIAGNOSIS — Z91199 Patient's noncompliance with other medical treatment and regimen due to unspecified reason: Secondary | ICD-10-CM

## 2019-04-28 NOTE — Telephone Encounter (Signed)
Dr. March Rummage states pt is to have Aquacel packing to the wound 3 times a week cover with a dry sterile dressing. Faxed to [facility].

## 2019-04-28 NOTE — Progress Notes (Addendum)
Subjective:  Patient ID: [name], male    DOB: [birth year],  MRN: [id number]  Chief Complaint  Patient presents with  . Wound Check    Pt seen only by Dr. March Rummage.   38 y.o. male presents for wound care follow-up. Just came from dialysis. Discharged from the hospital last night.  Very tired today from treatment denies any concerns.  Review of Systems: Negative except as noted in the HPI. Denies N/V/F/Ch.  Past Medical History:  Diagnosis Date  . Diabetes mellitus without complication ([place])   . Diabetic gastroparesis ([place])   . Dialysis patient ([place])   . Hypertension   . Renal disorder    Dialysis  . Sepsis ([place])     Current Outpatient Medications:  .  Amino Acids-Protein Hydrolys (FEEDING SUPPLEMENT, PRO-STAT SUGAR FREE 64,) LIQD, Take 30 mLs by mouth 2 (two) times a day., Disp: , Rfl:  .  aspirin 325 MG tablet, Take 325 mg by mouth at bedtime., Disp: , Rfl:  .  calcitRIOL (ROCALTROL) 0.5 MCG capsule, Take 5 capsules (2.5 mcg total) by mouth every Monday, Wednesday, and Friday with hemodialysis. (Patient not taking: Reported on 04/24/2019), Disp: 30 capsule, Rfl: 0 .  cinacalcet (SENSIPAR) 30 MG tablet, Take 30 mg by mouth daily with supper., Disp: , Rfl:  .  clonazePAM (KLONOPIN) 0.5 MG tablet, Take 0.5 mg by mouth at bedtime. , Disp: , Rfl:  .  collagenase (SANTYL) ointment, Apply 1 application topically daily., Disp: 15 g, Rfl: 0 .  collagenase (SANTYL) ointment, Apply 1 application topically daily. Left 1st metatarsal amputation site measuring 3.0 x 3.0 x 0.5cm (Patient not taking: Reported on 04/24/2019), Disp: 15 g, Rfl: 5 .  glucose blood ([employer] TEST STRIPS) test strip, by Other route 4 (four) times a day as needed., Disp: , Rfl:  .  HYDROcodone-acetaminophen (NORCO/VICODIN) 5-325 MG tablet, Take 1-2 tablets by mouth every 6 (six) hours as needed for moderate pain or severe pain. (Patient not taking: Reported on 04/24/2019), Disp: 20 tablet, Rfl: 0 .  insulin aspart  (NOVOLOG) 100 UNIT/ML injection, Inject 6-7 Units into the skin 2 (two) times daily with a meal. , Disp: , Rfl:  .  insulin glargine (LANTUS) 100 UNIT/ML injection, Inject 0.15 mLs (15 Units total) into the skin at bedtime., Disp: 10 mL, Rfl: 1 .  lactobacillus acidophilus & bulgar (LACTINEX) chewable tablet, Chew 1 tablet by mouth 3 (three) times daily with meals., Disp: , Rfl:  .  losartan (COZAAR) 50 MG tablet, Take 50 mg by mouth daily., Disp: , Rfl:  .  metoCLOPramide (REGLAN) 5 MG tablet, Take 5 mg by mouth 3 (three) times daily before meals. , Disp: , Rfl:  .  multivitamin (RENA-VIT) TABS tablet, Take 1 tablet by mouth at bedtime., Disp: 30 tablet, Rfl: 0 .  ondansetron (ZOFRAN ODT) 4 MG disintegrating tablet, Take 1 tablet (4 mg total) by mouth every 8 (eight) hours as needed for nausea or vomiting. (Patient not taking: Reported on 04/24/2019), Disp: 10 tablet, Rfl: 0 .  oxyCODONE (OXY IR/ROXICODONE) 5 MG immediate release tablet, Take 5 mg by mouth every 4 (four) hours as needed for severe pain., Disp: , Rfl:  .  promethazine (PHENERGAN) 25 MG tablet, Take 25 mg by mouth every 6 (six) hours as needed for nausea or vomiting., Disp: , Rfl:  .  sevelamer carbonate (RENVELA) 800 MG tablet, Take 2,400 mg by mouth See admin instructions. Take 5 times a day and two times a day with  snacks, Disp: , Rfl:  .  sildenafil (VIAGRA) 100 MG tablet, Take 100 mg by mouth daily as needed for erectile dysfunction., Disp: , Rfl:  .  silver sulfADIAZINE (SILVADENE) 1 % cream, Apply pea-sized amount to wound daily. (Patient not taking: Reported on 04/24/2019), Disp: 50 g, Rfl: 0 .  traMADol (ULTRAM) 50 MG tablet, Take 50 mg by mouth 2 (two) times daily. , Disp: , Rfl:  .  Vancomycin (VANCOCIN) 750-5 MG/150ML-% SOLN, Inject 150 mLs (750 mg total) into the vein every Monday, Wednesday, and Friday with hemodialysis., Disp: 4000 mL, Rfl: 0  Social History   Tobacco Use  Smoking Status Never Smoker  Smokeless Tobacco  Never Used    No Known Allergies Objective:   There were no vitals filed for this visit. There is no height or weight on file to calculate BMI. Constitutional Well developed.  Vascular Left foot warm to touch  Neurologic Oriented to person, place, and time. Epicritic sensation to light touch grossly absent bilaterally.  Dermatologic Dressing soaking wet, foot wholly macerated. Wound left 1st MPJ 1x1x3.5 Cartilaginous material likely from the sesamoid expressible from the wound prior to debridement . No gross purulence. No continued warmth and erythema.  Orthopedic: Left hallux and fifth toe amputations noted.   Radiographs: none Assessment:   1. 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 Chronic osteomyelitis involving left ankle and foot ([place])   3. Non-compliance    Plan:  Patient was evaluated and treated and all questions answered.  S/p L I&D of Abscess, Hallux Amputation, with debridement and wound subsequent partial closure -Will check on status of home health care referral.  Patient was previously seen by [facility].  He will need aggressive wound care consisting of Aquacel packing to the wound thrice weekly. -Continue abx with HD. Will need to f/u with ID. -Will need to F/u with PCP. Advised that he call so he can get DM shoes and toe filler. -The wound was aggressively debrided down to bone.  Today's debridement was more extensive than the incision and drainage he had performed in the hospital. Soft and gray bone was removed.  The wound was packed with Prisma x2 to promote tissue growth. -Patient presents today not wearing the boot but I ordered him and instead wearing a surgical shoe.  I have discussed the importance of wearing the boot for further offloading compared to the shoe.  I again stressed with patient today that if he really wants to have a hope of saving his foot he needs  to be compliant with my directions.  Discussed the importance of wearing the boot.  Patient states he will wear it.    Return in about 1 week (around 05/05/2019).

## 2019-04-29 DIAGNOSIS — N186 End stage renal disease: Secondary | ICD-10-CM | POA: Diagnosis not present

## 2019-04-29 DIAGNOSIS — Z992 Dependence on renal dialysis: Secondary | ICD-10-CM | POA: Diagnosis not present

## 2019-04-29 DIAGNOSIS — I129 Hypertensive chronic kidney disease with stage 1 through stage 4 chronic kidney disease, or unspecified chronic kidney disease: Secondary | ICD-10-CM | POA: Diagnosis not present

## 2019-04-29 LAB — AEROBIC/ANAEROBIC CULTURE W GRAM STAIN (SURGICAL/DEEP WOUND): Gram Stain: NONE SEEN

## 2019-04-29 LAB — CULTURE, BLOOD (ROUTINE X 2)
Culture: NO GROWTH
Special Requests: ADEQUATE

## 2019-05-01 DIAGNOSIS — Z992 Dependence on renal dialysis: Secondary | ICD-10-CM | POA: Diagnosis not present

## 2019-05-01 DIAGNOSIS — D509 Iron deficiency anemia, unspecified: Secondary | ICD-10-CM | POA: Diagnosis not present

## 2019-05-01 DIAGNOSIS — N2581 Secondary hyperparathyroidism of renal origin: Secondary | ICD-10-CM | POA: Diagnosis not present

## 2019-05-01 DIAGNOSIS — E1022 Type 1 diabetes mellitus with diabetic chronic kidney disease: Secondary | ICD-10-CM | POA: Diagnosis not present

## 2019-05-01 DIAGNOSIS — A4101 Sepsis due to Methicillin susceptible Staphylococcus aureus: Secondary | ICD-10-CM | POA: Diagnosis not present

## 2019-05-01 DIAGNOSIS — N186 End stage renal disease: Secondary | ICD-10-CM | POA: Diagnosis not present

## 2019-05-01 LAB — CULTURE, BLOOD (ROUTINE X 2)
Culture: NO GROWTH
Culture: NO GROWTH
Special Requests: ADEQUATE
Special Requests: ADEQUATE

## 2019-05-02 DIAGNOSIS — E1169 Type 2 diabetes mellitus with other specified complication: Secondary | ICD-10-CM | POA: Diagnosis not present

## 2019-05-02 DIAGNOSIS — M86672 Other chronic osteomyelitis, left ankle and foot: Secondary | ICD-10-CM | POA: Diagnosis not present

## 2019-05-02 DIAGNOSIS — E11621 Type 2 diabetes mellitus with foot ulcer: Secondary | ICD-10-CM | POA: Diagnosis not present

## 2019-05-02 DIAGNOSIS — Z992 Dependence on renal dialysis: Secondary | ICD-10-CM | POA: Diagnosis not present

## 2019-05-02 DIAGNOSIS — Z9119 Patient's noncompliance with other medical treatment and regimen: Secondary | ICD-10-CM | POA: Diagnosis not present

## 2019-05-02 DIAGNOSIS — E1122 Type 2 diabetes mellitus with diabetic chronic kidney disease: Secondary | ICD-10-CM | POA: Diagnosis not present

## 2019-05-02 DIAGNOSIS — Z792 Long term (current) use of antibiotics: Secondary | ICD-10-CM | POA: Diagnosis not present

## 2019-05-02 DIAGNOSIS — Z4800 Encounter for change or removal of nonsurgical wound dressing: Secondary | ICD-10-CM | POA: Diagnosis not present

## 2019-05-02 DIAGNOSIS — L97424 Non-pressure chronic ulcer of left heel and midfoot with necrosis of bone: Secondary | ICD-10-CM | POA: Diagnosis not present

## 2019-05-02 DIAGNOSIS — Z4781 Encounter for orthopedic aftercare following surgical amputation: Secondary | ICD-10-CM | POA: Diagnosis not present

## 2019-05-02 DIAGNOSIS — Z89412 Acquired absence of left great toe: Secondary | ICD-10-CM | POA: Diagnosis not present

## 2019-05-02 DIAGNOSIS — Z794 Long term (current) use of insulin: Secondary | ICD-10-CM | POA: Diagnosis not present

## 2019-05-02 DIAGNOSIS — I120 Hypertensive chronic kidney disease with stage 5 chronic kidney disease or end stage renal disease: Secondary | ICD-10-CM | POA: Diagnosis not present

## 2019-05-02 DIAGNOSIS — K3184 Gastroparesis: Secondary | ICD-10-CM | POA: Diagnosis not present

## 2019-05-02 DIAGNOSIS — N186 End stage renal disease: Secondary | ICD-10-CM | POA: Diagnosis not present

## 2019-05-02 DIAGNOSIS — Z79891 Long term (current) use of opiate analgesic: Secondary | ICD-10-CM | POA: Diagnosis not present

## 2019-05-02 DIAGNOSIS — E1143 Type 2 diabetes mellitus with diabetic autonomic (poly)neuropathy: Secondary | ICD-10-CM | POA: Diagnosis not present

## 2019-05-02 DIAGNOSIS — Z7982 Long term (current) use of aspirin: Secondary | ICD-10-CM | POA: Diagnosis not present

## 2019-05-03 ENCOUNTER — Telehealth: Payer: Self-pay | Admitting: *Deleted

## 2019-05-03 DIAGNOSIS — D509 Iron deficiency anemia, unspecified: Secondary | ICD-10-CM | POA: Diagnosis not present

## 2019-05-03 DIAGNOSIS — N2581 Secondary hyperparathyroidism of renal origin: Secondary | ICD-10-CM | POA: Diagnosis not present

## 2019-05-03 DIAGNOSIS — Z992 Dependence on renal dialysis: Secondary | ICD-10-CM | POA: Diagnosis not present

## 2019-05-03 DIAGNOSIS — A4101 Sepsis due to Methicillin susceptible Staphylococcus aureus: Secondary | ICD-10-CM | POA: Diagnosis not present

## 2019-05-03 DIAGNOSIS — E1022 Type 1 diabetes mellitus with diabetic chronic kidney disease: Secondary | ICD-10-CM | POA: Diagnosis not present

## 2019-05-03 DIAGNOSIS — N186 End stage renal disease: Secondary | ICD-10-CM | POA: Diagnosis not present

## 2019-05-03 NOTE — Telephone Encounter (Signed)
[facility] states needs to make sure Dr. March Rummage will sign orders for wound care, is silver alginate to be use on the bottom of the foot and what are the orders for the the toe amputation site that is still oozing but is still crusty, can call today or tomorrow.

## 2019-05-04 ENCOUNTER — Other Ambulatory Visit: Payer: Self-pay

## 2019-05-04 ENCOUNTER — Ambulatory Visit (INDEPENDENT_AMBULATORY_CARE_PROVIDER_SITE_OTHER): Payer: Medicare Other | Admitting: Podiatry

## 2019-05-04 DIAGNOSIS — M86672 Other chronic osteomyelitis, left ankle and foot: Secondary | ICD-10-CM

## 2019-05-04 DIAGNOSIS — L97424 Non-pressure chronic ulcer of left heel and midfoot with necrosis of bone: Secondary | ICD-10-CM

## 2019-05-04 DIAGNOSIS — E08621 Diabetes mellitus due to underlying condition with foot ulcer: Secondary | ICD-10-CM

## 2019-05-04 NOTE — Telephone Encounter (Signed)
Faxed a copy of Dr. Eleanora Neighbor 05/04/2019 11:06am orders to [facility].

## 2019-05-04 NOTE — Telephone Encounter (Signed)
Aquacell packing to wound from superior to inferior (patient has wound that connects dorsally to plantarly). Change every other day. 4x4, kerlix, and tape.

## 2019-05-04 NOTE — Progress Notes (Signed)
Subjective:  Patient ID: [name], male    DOB: 11-12-1980,  MRN: [id number]  Chief Complaint  Patient presents with  . Diabetic Ulcer     1 week follow up dibaetic ulcer left midfoot    38 y.o. male presents for wound care follow-up.  Denies nausea vomiting fever chills feels okay.  Receive antibiotics with dialysis.  He is wearing his cam boot today.  Denies new complaints  Review of Systems: Negative except as noted in the HPI. Denies N/V/F/Ch.  Past Medical History:  Diagnosis Date  . Diabetes mellitus without complication ([place])   . Diabetic gastroparesis ([place])   . Dialysis patient ([place])   . Hypertension   . Renal disorder    Dialysis  . Sepsis ([place])     Current Outpatient Medications:  .  Amino Acids-Protein Hydrolys (FEEDING SUPPLEMENT, PRO-STAT SUGAR FREE 64,) LIQD, Take 30 mLs by mouth 2 (two) times a day., Disp: , Rfl:  .  aspirin 325 MG tablet, Take 325 mg by mouth at bedtime., Disp: , Rfl:  .  calcitRIOL (ROCALTROL) 0.5 MCG capsule, Take 5 capsules (2.5 mcg total) by mouth every Monday, Wednesday, and Friday with hemodialysis. (Patient not taking: Reported on 04/24/2019), Disp: 30 capsule, Rfl: 0 .  cinacalcet (SENSIPAR) 30 MG tablet, Take 30 mg by mouth daily with supper., Disp: , Rfl:  .  clonazePAM (KLONOPIN) 0.5 MG tablet, Take 0.5 mg by mouth at bedtime. , Disp: , Rfl:  .  collagenase (SANTYL) ointment, Apply 1 application topically daily., Disp: 15 g, Rfl: 0 .  collagenase (SANTYL) ointment, Apply 1 application topically daily. Left 1st metatarsal amputation site measuring 3.0 x 3.0 x 0.5cm (Patient not taking: Reported on 04/24/2019), Disp: 15 g, Rfl: 5 .  glucose blood ([employer]ER TEST STRIPS) test strip, by Other route 4 (four) times a day as needed., Disp: , Rfl:  .  HYDROcodone-acetaminophen (NORCO/VICODIN) 5-325 MG tablet, Take 1-2 tablets by mouth every 6 (six) hours as needed for moderate pain or severe pain. (Patient not taking: Reported on 04/24/2019),  Disp: 20 tablet, Rfl: 0 .  insulin aspart (NOVOLOG) 100 UNIT/ML injection, Inject 6-7 Units into the skin 2 (two) times daily with a meal. , Disp: , Rfl:  .  insulin glargine (LANTUS) 100 UNIT/ML injection, Inject 0.15 mLs (15 Units total) into the skin at bedtime., Disp: 10 mL, Rfl: 1 .  lactobacillus acidophilus & bulgar (LACTINEX) chewable tablet, Chew 1 tablet by mouth 3 (three) times daily with meals., Disp: , Rfl:  .  losartan (COZAAR) 50 MG tablet, Take 50 mg by mouth daily., Disp: , Rfl:  .  metoCLOPramide (REGLAN) 5 MG tablet, Take 5 mg by mouth 3 (three) times daily before meals. , Disp: , Rfl:  .  multivitamin (RENA-VIT) TABS tablet, Take 1 tablet by mouth at bedtime., Disp: 30 tablet, Rfl: 0 .  ondansetron (ZOFRAN ODT) 4 MG disintegrating tablet, Take 1 tablet (4 mg total) by mouth every 8 (eight) hours as needed for nausea or vomiting. (Patient not taking: Reported on 04/24/2019), Disp: 10 tablet, Rfl: 0 .  oxyCODONE (OXY IR/ROXICODONE) 5 MG immediate release tablet, Take 5 mg by mouth every 4 (four) hours as needed for severe pain., Disp: , Rfl:  .  promethazine (PHENERGAN) 25 MG tablet, Take 25 mg by mouth every 6 (six) hours as needed for nausea or vomiting., Disp: , Rfl:  .  sevelamer carbonate (RENVELA) 800 MG tablet, Take 2,400 mg by mouth See admin instructions. Take  5 times a day and two times a day with snacks, Disp: , Rfl:  .  sildenafil (VIAGRA) 100 MG tablet, Take 100 mg by mouth daily as needed for erectile dysfunction., Disp: , Rfl:  .  silver sulfADIAZINE (SILVADENE) 1 % cream, Apply pea-sized amount to wound daily. (Patient not taking: Reported on 04/24/2019), Disp: 50 g, Rfl: 0 .  traMADol (ULTRAM) 50 MG tablet, Take 50 mg by mouth 2 (two) times daily. , Disp: , Rfl:  .  Vancomycin (VANCOCIN) 750-5 MG/150ML-% SOLN, Inject 150 mLs (750 mg total) into the vein every Monday, Wednesday, and Friday with hemodialysis., Disp: 4000 mL, Rfl: 0  Social History   Tobacco Use   Smoking Status Never Smoker  Smokeless Tobacco Never Used    No Known Allergies Objective:   There were no vitals filed for this visit. There is no height or weight on file to calculate BMI. Constitutional Well developed.  Vascular Left foot warm to touch edematous.  Edema noted to the leg  Neurologic Oriented to person, place, and time. Epicritic sensation to light touch grossly absent bilaterally.  Dermatologic  left first MPJ wound plantarly measuring 1 cm x 1 cm.  New dorsal wound communicates with his plantar wound.  4 cm probing between dorsal and plantar wound.  Bone and cartilage debris noted in the wound bed.  First metatarsal bone soft.  Orthopedic: Left hallux and fifth toe amputations noted.   Radiographs: none Assessment:   1. 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 Chronic osteomyelitis involving left ankle and foot ([place])    Plan:  Patient was evaluated and treated and all questions answered.  S/p L I&D of Abscess, Hallux Amputation, with debridement and wound subsequent partial closure -Continue antibiotics with hemodialysis -There is a new wound dorsally that communicates this plantar wound.  This was not present at his last visit.  The wound was packed from dorsal to plantar with a single strip of iodoform packing.  This will need to be removed tomorrow by his home health care.  He will need Aquacell packing qOD to the area. Orders for home health care were given to patient and verbally recorded for patient to show to his nurse.  They will additionally be sent by RN to home health care agency. -I am concerned that his foot and leg are slightly warm and edematous.  He has known infection and is on antibiotics.  As there is no purulence I do not believe there is need for acute intervention.  I did discuss the patient that I do think he will need surgery and that I think antibiotics alone  are unlikely to resolve the infection. -The wound was debrided thoroughly.  Again today there were pieces of bone and cartilage deep in the wound bed.  The wound was debrided down to the first metatarsal.  The first metatarsal again appeared soft.  If patient continues to refuse surgery hopefully repeated debridement of the infected bone will reduce the biological burden and potentially help resolve his infection however I ultimately think he will need partial ray amputation with closure.  I however am concerned that still he will have pressure at the second third and fourth metatarsal areas.  Ultimately he would benefit from transmetatarsal amputation however I do not think he would be amenable to this.  We will continue with antibiotics as per patient's decision however should acute on chronic infection  occur or patient becomes septic would have to consider surgical intervention. -Wound packed with iodoform packing dressed with dry sterile dressing. -Continue cam boot immobilization -Discussed the patient importance of finding a PCP so that eventually can get diabetic shoes.  He has an appoint with the nurse practitioner I discussed with him that he needs to have an appointment with a medical doctor per Medicare guidelines.  Written instructions were written for the patient to call the primary care office and change his appointment from a nurse practitioner to a medical doctor.  Procedure: Excisional Debridement of Wound Rationale: Removal of non-viable soft tissue from the wound to promote healing.  Anesthesia: none Pre-Debridement Wound Measurements: 1 cm x 1 cm x 4 cm  Post-Debridement Wound Measurements: 1 cm x 1 cm x 4 cm  Type of Debridement: Sharp Excisional Tissue Removed: Non-viable soft tissue, bone, cartilage Depth of Debridement: 1st metatarsal bone Technique: Sharp excisional debridement to bleeding, viable wound base.  Dressing: Dry, sterile, compression dressing. Disposition: Patient  tolerated procedure well. Patient to return in 1 week for follow-up.   No follow-ups on file.

## 2019-05-05 DIAGNOSIS — N186 End stage renal disease: Secondary | ICD-10-CM | POA: Diagnosis not present

## 2019-05-05 DIAGNOSIS — D509 Iron deficiency anemia, unspecified: Secondary | ICD-10-CM | POA: Diagnosis not present

## 2019-05-05 DIAGNOSIS — N2581 Secondary hyperparathyroidism of renal origin: Secondary | ICD-10-CM | POA: Diagnosis not present

## 2019-05-05 DIAGNOSIS — Z992 Dependence on renal dialysis: Secondary | ICD-10-CM | POA: Diagnosis not present

## 2019-05-05 DIAGNOSIS — A4101 Sepsis due to Methicillin susceptible Staphylococcus aureus: Secondary | ICD-10-CM | POA: Diagnosis not present

## 2019-05-05 DIAGNOSIS — E1022 Type 1 diabetes mellitus with diabetic chronic kidney disease: Secondary | ICD-10-CM | POA: Diagnosis not present

## 2019-05-08 ENCOUNTER — Telehealth: Payer: Self-pay | Admitting: *Deleted

## 2019-05-08 ENCOUNTER — Inpatient Hospital Stay (INDEPENDENT_AMBULATORY_CARE_PROVIDER_SITE_OTHER): Payer: Medicare Other | Admitting: Primary Care

## 2019-05-08 DIAGNOSIS — Z992 Dependence on renal dialysis: Secondary | ICD-10-CM | POA: Diagnosis not present

## 2019-05-08 DIAGNOSIS — N2581 Secondary hyperparathyroidism of renal origin: Secondary | ICD-10-CM | POA: Diagnosis not present

## 2019-05-08 DIAGNOSIS — N186 End stage renal disease: Secondary | ICD-10-CM | POA: Diagnosis not present

## 2019-05-08 DIAGNOSIS — D509 Iron deficiency anemia, unspecified: Secondary | ICD-10-CM | POA: Diagnosis not present

## 2019-05-08 DIAGNOSIS — E1022 Type 1 diabetes mellitus with diabetic chronic kidney disease: Secondary | ICD-10-CM | POA: Diagnosis not present

## 2019-05-08 DIAGNOSIS — A4101 Sepsis due to Methicillin susceptible Staphylococcus aureus: Secondary | ICD-10-CM | POA: Diagnosis not present

## 2019-05-08 NOTE — Telephone Encounter (Signed)
[facility] - [name] states Friday she packed the wound with alginate and at today's appt all of the alginate was out of the wound, it appears pt changed the dressing at least 2 times over the weekend. [name] states now the wooden applicator with not go into the dorsal wound and she pushed a little there was bright red blood, and there are 2 wounds now one dorsal and one plantar. [name] states she packed both areas with alginate and asked if Dr. March Rummage would prefer iodoform that pt has at the house.

## 2019-05-08 NOTE — Telephone Encounter (Signed)
Called and gave [name] directions for Aquacell Ag packing three times weekly

## 2019-05-10 DIAGNOSIS — N2581 Secondary hyperparathyroidism of renal origin: Secondary | ICD-10-CM | POA: Diagnosis not present

## 2019-05-10 DIAGNOSIS — E1022 Type 1 diabetes mellitus with diabetic chronic kidney disease: Secondary | ICD-10-CM | POA: Diagnosis not present

## 2019-05-10 DIAGNOSIS — A4101 Sepsis due to Methicillin susceptible Staphylococcus aureus: Secondary | ICD-10-CM | POA: Diagnosis not present

## 2019-05-10 DIAGNOSIS — D509 Iron deficiency anemia, unspecified: Secondary | ICD-10-CM | POA: Diagnosis not present

## 2019-05-10 DIAGNOSIS — N186 End stage renal disease: Secondary | ICD-10-CM | POA: Diagnosis not present

## 2019-05-10 DIAGNOSIS — Z992 Dependence on renal dialysis: Secondary | ICD-10-CM | POA: Diagnosis not present

## 2019-05-11 ENCOUNTER — Ambulatory Visit (INDEPENDENT_AMBULATORY_CARE_PROVIDER_SITE_OTHER): Payer: Medicare Other | Admitting: Podiatry

## 2019-05-11 ENCOUNTER — Other Ambulatory Visit: Payer: Self-pay

## 2019-05-11 DIAGNOSIS — E08621 Diabetes mellitus due to underlying condition with foot ulcer: Secondary | ICD-10-CM

## 2019-05-11 DIAGNOSIS — L97424 Non-pressure chronic ulcer of left heel and midfoot with necrosis of bone: Secondary | ICD-10-CM | POA: Diagnosis not present

## 2019-05-11 NOTE — Progress Notes (Signed)
Subjective:  Patient ID: [name], male    DOB: [birth year],  MRN: [id number]  Chief Complaint  Patient presents with  . Wound Check    left foot wound check, pt did not want me to take off bandages     38 y.o. male presents for wound care follow-up. Doing ok getting abx at HD no new complaints.  Review of Systems: Negative except as noted in the HPI. Denies N/V/F/Ch.  Past Medical History:  Diagnosis Date  . Diabetes mellitus without complication ([place])   . Diabetic gastroparesis ([place])   . Dialysis patient ([facility])   . Hypertension   . Renal disorder    Dialysis  . Sepsis ([place])     Current Outpatient Medications:  .  Amino Acids-Protein Hydrolys (FEEDING SUPPLEMENT, PRO-STAT SUGAR FREE 64,) LIQD, Take 30 mLs by mouth 2 (two) times a day., Disp: , Rfl:  .  aspirin 325 MG tablet, Take 325 mg by mouth at bedtime., Disp: , Rfl:  .  calcitRIOL (ROCALTROL) 0.5 MCG capsule, Take 5 capsules (2.5 mcg total) by mouth every Monday, Wednesday, and Friday with hemodialysis., Disp: 30 capsule, Rfl: 0 .  cinacalcet (SENSIPAR) 30 MG tablet, Take 30 mg by mouth daily with supper., Disp: , Rfl:  .  clonazePAM (KLONOPIN) 0.5 MG tablet, Take 0.5 mg by mouth at bedtime. , Disp: , Rfl:  .  collagenase (SANTYL) ointment, Apply 1 application topically daily., Disp: 15 g, Rfl: 0 .  collagenase (SANTYL) ointment, Apply 1 application topically daily. Left 1st metatarsal amputation site measuring 3.0 x 3.0 x 0.5cm, Disp: 15 g, Rfl: 5 .  glucose blood ([employer]OGER TEST STRIPS) test strip, by Other route 4 (four) times a day as needed., Disp: , Rfl:  .  HYDROcodone-acetaminophen (NORCO/VICODIN) 5-325 MG tablet, Take 1-2 tablets by mouth every 6 (six) hours as needed for moderate pain or severe pain., Disp: 20 tablet, Rfl: 0 .  insulin aspart (NOVOLOG) 100 UNIT/ML injection, Inject 6-7 Units into the skin 2 (two) times daily with a meal. , Disp: , Rfl:  .  insulin glargine (LANTUS) 100 UNIT/ML injection, Inject  0.15 mLs (15 Units total) into the skin at bedtime., Disp: 10 mL, Rfl: 1 .  lactobacillus acidophilus & bulgar (LACTINEX) chewable tablet, Chew 1 tablet by mouth 3 (three) times daily with meals., Disp: , Rfl:  .  losartan (COZAAR) 50 MG tablet, Take 50 mg by mouth daily., Disp: , Rfl:  .  metoCLOPramide (REGLAN) 5 MG tablet, Take 5 mg by mouth 3 (three) times daily before meals. , Disp: , Rfl:  .  multivitamin (RENA-VIT) TABS tablet, Take 1 tablet by mouth at bedtime., Disp: 30 tablet, Rfl: 0 .  ondansetron (ZOFRAN ODT) 4 MG disintegrating tablet, Take 1 tablet (4 mg total) by mouth every 8 (eight) hours as needed for nausea or vomiting., Disp: 10 tablet, Rfl: 0 .  oxyCODONE (OXY IR/ROXICODONE) 5 MG immediate release tablet, Take 5 mg by mouth every 4 (four) hours as needed for severe pain., Disp: , Rfl:  .  promethazine (PHENERGAN) 25 MG tablet, Take 25 mg by mouth every 6 (six) hours as needed for nausea or vomiting., Disp: , Rfl:  .  sevelamer carbonate (RENVELA) 800 MG tablet, Take 2,400 mg by mouth See admin instructions. Take 5 times a day and two times a day with snacks, Disp: , Rfl:  .  sildenafil (VIAGRA) 100 MG tablet, Take 100 mg by mouth daily as needed for erectile dysfunction., Disp: ,  Rfl:  .  silver sulfADIAZINE (SILVADENE) 1 % cream, Apply pea-sized amount to wound daily., Disp: 50 g, Rfl: 0 .  traMADol (ULTRAM) 50 MG tablet, Take 50 mg by mouth 2 (two) times daily. , Disp: , Rfl:  .  Vancomycin (VANCOCIN) 750-5 MG/150ML-% SOLN, Inject 150 mLs (750 mg total) into the vein every Monday, Wednesday, and Friday with hemodialysis., Disp: 4000 mL, Rfl: 0  Social History   Tobacco Use  Smoking Status Never Smoker  Smokeless Tobacco Never Used    No Known Allergies Objective:   There were no vitals filed for this visit. There is no height or weight on file to calculate BMI. Constitutional Well developed.  Vascular Left foot warm to touch edematous.  Edema noted to the leg   Neurologic Oriented to person, place, and time. Epicritic sensation to light touch grossly absent bilaterally.  Dermatologic  left first MPJ wound plantarly measuring 0.8x1 post debridement. Dorsal wound 0.5x0.5 .  4 cm probing between dorsal and plantar wound. First metatarsal bone more firm today.  Orthopedic: Left hallux and fifth toe amputations noted.   Radiographs: none Assessment:   1. 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  S/p L I&D of Abscess, Hallux Amputation, with debridement and wound subsequent partial closure -Continue antibiotics with hemodialysis -The wound dorsally is decreasing in size. -The plantar wound additionally is decreasing in size. -The wound was debrided down to bone. More firm, healthy bone was noted today. Debridement of necrotic bone in office warranted as patient has refused surgery. -The wound was packed from dorsal to plantar with a single strip of iodoform packing. HHC to pack with aquacell. Had spoken to his [place] this week to give verbal orders. -Discussed compliance with CAM boot.  Procedure: Excisional Debridement of Wound Rationale: Removal of non-viable soft tissue from the wound to promote healing.  Anesthesia: none Pre-Debridement Wound Measurements: 0.5 cm x 1 cm x 4 cm  Post-Debridement Wound Measurements: 0.8 cm x 1 cm x 4 cm  Type of Debridement: Sharp Excisional Tissue Removed: non viable soft tissue and bone Depth of Debridement: bone Technique: Sharp excisional debridement to bleeding, viable wound base.  Dressing: Dry, sterile, compression dressing. Disposition: Patient tolerated procedure well. Patient to return in 1 week for follow-up.      Return in about 1 week (around 05/18/2019).

## 2019-05-12 DIAGNOSIS — N186 End stage renal disease: Secondary | ICD-10-CM | POA: Diagnosis not present

## 2019-05-12 DIAGNOSIS — N2581 Secondary hyperparathyroidism of renal origin: Secondary | ICD-10-CM | POA: Diagnosis not present

## 2019-05-12 DIAGNOSIS — A4101 Sepsis due to Methicillin susceptible Staphylococcus aureus: Secondary | ICD-10-CM | POA: Diagnosis not present

## 2019-05-12 DIAGNOSIS — Z992 Dependence on renal dialysis: Secondary | ICD-10-CM | POA: Diagnosis not present

## 2019-05-12 DIAGNOSIS — E1022 Type 1 diabetes mellitus with diabetic chronic kidney disease: Secondary | ICD-10-CM | POA: Diagnosis not present

## 2019-05-12 DIAGNOSIS — D509 Iron deficiency anemia, unspecified: Secondary | ICD-10-CM | POA: Diagnosis not present

## 2019-05-15 DIAGNOSIS — A4101 Sepsis due to Methicillin susceptible Staphylococcus aureus: Secondary | ICD-10-CM | POA: Diagnosis not present

## 2019-05-15 DIAGNOSIS — N2581 Secondary hyperparathyroidism of renal origin: Secondary | ICD-10-CM | POA: Diagnosis not present

## 2019-05-15 DIAGNOSIS — Z992 Dependence on renal dialysis: Secondary | ICD-10-CM | POA: Diagnosis not present

## 2019-05-15 DIAGNOSIS — N186 End stage renal disease: Secondary | ICD-10-CM | POA: Diagnosis not present

## 2019-05-15 DIAGNOSIS — D509 Iron deficiency anemia, unspecified: Secondary | ICD-10-CM | POA: Diagnosis not present

## 2019-05-15 DIAGNOSIS — E1022 Type 1 diabetes mellitus with diabetic chronic kidney disease: Secondary | ICD-10-CM | POA: Diagnosis not present

## 2019-05-17 DIAGNOSIS — N2581 Secondary hyperparathyroidism of renal origin: Secondary | ICD-10-CM | POA: Diagnosis not present

## 2019-05-17 DIAGNOSIS — A4101 Sepsis due to Methicillin susceptible Staphylococcus aureus: Secondary | ICD-10-CM | POA: Diagnosis not present

## 2019-05-17 DIAGNOSIS — D509 Iron deficiency anemia, unspecified: Secondary | ICD-10-CM | POA: Diagnosis not present

## 2019-05-17 DIAGNOSIS — Z992 Dependence on renal dialysis: Secondary | ICD-10-CM | POA: Diagnosis not present

## 2019-05-17 DIAGNOSIS — N186 End stage renal disease: Secondary | ICD-10-CM | POA: Diagnosis not present

## 2019-05-17 DIAGNOSIS — E1022 Type 1 diabetes mellitus with diabetic chronic kidney disease: Secondary | ICD-10-CM | POA: Diagnosis not present

## 2019-05-18 ENCOUNTER — Ambulatory Visit (INDEPENDENT_AMBULATORY_CARE_PROVIDER_SITE_OTHER): Payer: Medicare Other | Admitting: Podiatry

## 2019-05-18 ENCOUNTER — Other Ambulatory Visit: Payer: Self-pay

## 2019-05-18 VITALS — Temp 97.2°F

## 2019-05-18 DIAGNOSIS — E08621 Diabetes mellitus due to underlying condition with foot ulcer: Secondary | ICD-10-CM

## 2019-05-18 DIAGNOSIS — L97424 Non-pressure chronic ulcer of left heel and midfoot with necrosis of bone: Secondary | ICD-10-CM | POA: Diagnosis not present

## 2019-05-19 DIAGNOSIS — N2581 Secondary hyperparathyroidism of renal origin: Secondary | ICD-10-CM | POA: Diagnosis not present

## 2019-05-19 DIAGNOSIS — D509 Iron deficiency anemia, unspecified: Secondary | ICD-10-CM | POA: Diagnosis not present

## 2019-05-19 DIAGNOSIS — N186 End stage renal disease: Secondary | ICD-10-CM | POA: Diagnosis not present

## 2019-05-19 DIAGNOSIS — E1022 Type 1 diabetes mellitus with diabetic chronic kidney disease: Secondary | ICD-10-CM | POA: Diagnosis not present

## 2019-05-19 DIAGNOSIS — A4101 Sepsis due to Methicillin susceptible Staphylococcus aureus: Secondary | ICD-10-CM | POA: Diagnosis not present

## 2019-05-19 DIAGNOSIS — Z992 Dependence on renal dialysis: Secondary | ICD-10-CM | POA: Diagnosis not present

## 2019-05-22 DIAGNOSIS — N2581 Secondary hyperparathyroidism of renal origin: Secondary | ICD-10-CM | POA: Diagnosis not present

## 2019-05-22 DIAGNOSIS — N186 End stage renal disease: Secondary | ICD-10-CM | POA: Diagnosis not present

## 2019-05-22 DIAGNOSIS — E1022 Type 1 diabetes mellitus with diabetic chronic kidney disease: Secondary | ICD-10-CM | POA: Diagnosis not present

## 2019-05-22 DIAGNOSIS — A4101 Sepsis due to Methicillin susceptible Staphylococcus aureus: Secondary | ICD-10-CM | POA: Diagnosis not present

## 2019-05-22 DIAGNOSIS — D509 Iron deficiency anemia, unspecified: Secondary | ICD-10-CM | POA: Diagnosis not present

## 2019-05-22 DIAGNOSIS — Z992 Dependence on renal dialysis: Secondary | ICD-10-CM | POA: Diagnosis not present

## 2019-05-23 DIAGNOSIS — R159 Full incontinence of feces: Secondary | ICD-10-CM | POA: Diagnosis not present

## 2019-05-23 DIAGNOSIS — K529 Noninfective gastroenteritis and colitis, unspecified: Secondary | ICD-10-CM | POA: Diagnosis not present

## 2019-05-24 DIAGNOSIS — Z992 Dependence on renal dialysis: Secondary | ICD-10-CM | POA: Diagnosis not present

## 2019-05-24 DIAGNOSIS — N186 End stage renal disease: Secondary | ICD-10-CM | POA: Diagnosis not present

## 2019-05-24 DIAGNOSIS — E1022 Type 1 diabetes mellitus with diabetic chronic kidney disease: Secondary | ICD-10-CM | POA: Diagnosis not present

## 2019-05-24 DIAGNOSIS — A4101 Sepsis due to Methicillin susceptible Staphylococcus aureus: Secondary | ICD-10-CM | POA: Diagnosis not present

## 2019-05-24 DIAGNOSIS — D509 Iron deficiency anemia, unspecified: Secondary | ICD-10-CM | POA: Diagnosis not present

## 2019-05-24 DIAGNOSIS — N2581 Secondary hyperparathyroidism of renal origin: Secondary | ICD-10-CM | POA: Diagnosis not present

## 2019-05-25 ENCOUNTER — Other Ambulatory Visit: Payer: Self-pay

## 2019-05-25 ENCOUNTER — Ambulatory Visit (INDEPENDENT_AMBULATORY_CARE_PROVIDER_SITE_OTHER): Payer: Medicare Other | Admitting: Podiatry

## 2019-05-25 ENCOUNTER — Encounter (INDEPENDENT_AMBULATORY_CARE_PROVIDER_SITE_OTHER): Payer: Self-pay | Admitting: Primary Care

## 2019-05-25 ENCOUNTER — Ambulatory Visit (INDEPENDENT_AMBULATORY_CARE_PROVIDER_SITE_OTHER): Payer: Medicare Other | Admitting: Primary Care

## 2019-05-25 VITALS — Temp 97.8°F

## 2019-05-25 VITALS — BP 103/56 | HR 94 | Temp 98.6°F | Ht 67.0 in | Wt 171.8 lb

## 2019-05-25 DIAGNOSIS — R159 Full incontinence of feces: Secondary | ICD-10-CM

## 2019-05-25 DIAGNOSIS — E1059 Type 1 diabetes mellitus with other circulatory complications: Secondary | ICD-10-CM

## 2019-05-25 DIAGNOSIS — Z23 Encounter for immunization: Secondary | ICD-10-CM

## 2019-05-25 DIAGNOSIS — R32 Unspecified urinary incontinence: Secondary | ICD-10-CM

## 2019-05-25 DIAGNOSIS — L97524 Non-pressure chronic ulcer of other part of left foot with necrosis of bone: Secondary | ICD-10-CM

## 2019-05-25 DIAGNOSIS — E10621 Type 1 diabetes mellitus with foot ulcer: Secondary | ICD-10-CM | POA: Diagnosis not present

## 2019-05-25 DIAGNOSIS — N529 Male erectile dysfunction, unspecified: Secondary | ICD-10-CM

## 2019-05-25 DIAGNOSIS — E08621 Diabetes mellitus due to underlying condition with foot ulcer: Secondary | ICD-10-CM

## 2019-05-25 DIAGNOSIS — L97424 Non-pressure chronic ulcer of left heel and midfoot with necrosis of bone: Secondary | ICD-10-CM

## 2019-05-25 LAB — GLUCOSE, POCT (MANUAL RESULT ENTRY): POC Glucose: 345 mg/dl — AB (ref 70–99)

## 2019-05-25 LAB — POCT GLYCOSYLATED HEMOGLOBIN (HGB A1C): Hemoglobin A1C: 8 % — AB (ref 4.0–5.6)

## 2019-05-25 MED ORDER — INSULIN GLARGINE 100 UNIT/ML ~~LOC~~ SOLN: 15.0000 [IU] | Freq: Every day | SUBCUTANEOUS | 3 refills | Status: DC

## 2019-05-25 MED ORDER — SILDENAFIL CITRATE 100 MG PO TABS: 100.0000 mg | ORAL_TABLET | Freq: Every day | ORAL | 3 refills | Status: DC | PRN

## 2019-05-25 MED ORDER — INSULIN ASPART 100 UNIT/ML ~~LOC~~ SOLN: 6.0000 [IU] | Freq: Two times a day (BID) | SUBCUTANEOUS | 3 refills | Status: DC

## 2019-05-25 MED ORDER — METOCLOPRAMIDE HCL 5 MG PO TABS: 5.0000 mg | ORAL_TABLET | Freq: Three times a day (TID) | ORAL | 3 refills | Status: DC

## 2019-05-25 NOTE — Progress Notes (Signed)
Subjective:  Patient ID: [name], male    DOB: 1980/10/13,  MRN: [id number]  Chief Complaint  Patient presents with  . Wound Check    Pt seen by Dr. March Rummage.    38 y.o. male presents for wound care follow-up. Has appt with PCP today. Otherwise no new complaints.  Review of Systems: Negative except as noted in the HPI. Denies N/V/F/Ch.  Past Medical History:  Diagnosis Date  . Diabetes mellitus without complication ([place])   . Diabetic gastroparesis ([place])   . Dialysis patient ([place])   . Hypertension   . Renal disorder    Dialysis  . Sepsis ([place])     Current Outpatient Medications:  .  Amino Acids-Protein Hydrolys (FEEDING SUPPLEMENT, PRO-STAT SUGAR FREE 64,) LIQD, Take 30 mLs by mouth 2 (two) times a day., Disp: , Rfl:  .  aspirin 325 MG tablet, Take 325 mg by mouth at bedtime., Disp: , Rfl:  .  calcitRIOL (ROCALTROL) 0.5 MCG capsule, Take 5 capsules (2.5 mcg total) by mouth every Monday, Wednesday, and Friday with hemodialysis., Disp: 30 capsule, Rfl: 0 .  cinacalcet (SENSIPAR) 30 MG tablet, Take 30 mg by mouth daily with supper., Disp: , Rfl:  .  clonazePAM (KLONOPIN) 0.5 MG tablet, Take 0.5 mg by mouth at bedtime. , Disp: , Rfl:  .  collagenase (SANTYL) ointment, Apply 1 application topically daily., Disp: 15 g, Rfl: 0 .  collagenase (SANTYL) ointment, Apply 1 application topically daily. Left 1st metatarsal amputation site measuring 3.0 x 3.0 x 0.5cm, Disp: 15 g, Rfl: 5 .  glucose blood ([employer] TEST STRIPS) test strip, by Other route 4 (four) times a day as needed., Disp: , Rfl:  .  HYDROcodone-acetaminophen (NORCO/VICODIN) 5-325 MG tablet, Take 1-2 tablets by mouth every 6 (six) hours as needed for moderate pain or severe pain., Disp: 20 tablet, Rfl: 0 .  insulin aspart (NOVOLOG) 100 UNIT/ML injection, Inject 6-7 Units into the skin 2 (two) times daily with a meal. , Disp: , Rfl:  .  insulin glargine (LANTUS) 100 UNIT/ML injection, Inject 0.15 mLs (15 Units total) into the  skin at bedtime., Disp: 10 mL, Rfl: 1 .  lactobacillus acidophilus & bulgar (LACTINEX) chewable tablet, Chew 1 tablet by mouth 3 (three) times daily with meals., Disp: , Rfl:  .  losartan (COZAAR) 50 MG tablet, Take 50 mg by mouth daily., Disp: , Rfl:  .  metoCLOPramide (REGLAN) 5 MG tablet, Take 5 mg by mouth 3 (three) times daily before meals. , Disp: , Rfl:  .  multivitamin (RENA-VIT) TABS tablet, Take 1 tablet by mouth at bedtime., Disp: 30 tablet, Rfl: 0 .  ondansetron (ZOFRAN ODT) 4 MG disintegrating tablet, Take 1 tablet (4 mg total) by mouth every 8 (eight) hours as needed for nausea or vomiting., Disp: 10 tablet, Rfl: 0 .  oxyCODONE (OXY IR/ROXICODONE) 5 MG immediate release tablet, Take 5 mg by mouth every 4 (four) hours as needed for severe pain., Disp: , Rfl:  .  promethazine (PHENERGAN) 25 MG tablet, Take 25 mg by mouth every 6 (six) hours as needed for nausea or vomiting., Disp: , Rfl:  .  sevelamer carbonate (RENVELA) 800 MG tablet, Take 2,400 mg by mouth See admin instructions. Take 5 times a day and two times a day with snacks, Disp: , Rfl:  .  sildenafil (VIAGRA) 100 MG tablet, Take 100 mg by mouth daily as needed for erectile dysfunction., Disp: , Rfl:  .  silver sulfADIAZINE (SILVADENE) 1 %  cream, Apply pea-sized amount to wound daily., Disp: 50 g, Rfl: 0 .  traMADol (ULTRAM) 50 MG tablet, Take 50 mg by mouth 2 (two) times daily. , Disp: , Rfl:  .  Vancomycin (VANCOCIN) 750-5 MG/150ML-% SOLN, Inject 150 mLs (750 mg total) into the vein every Monday, Wednesday, and Friday with hemodialysis., Disp: 4000 mL, Rfl: 0  Social History   Tobacco Use  Smoking Status Never Smoker  Smokeless Tobacco Never Used    No Known Allergies Objective:   Vitals:   05/25/19 0856  Temp: 97.8 F (36.6 C)   There is no height or weight on file to calculate BMI. Constitutional Well developed.  Vascular Left foot warm to touch edematous.  Edema noted to the leg  Neurologic Oriented to  person, place, and time. Epicritic sensation to light touch grossly absent bilaterally.  Dermatologic Left 1st MPJ wound plantarly 1x1x3 with soft bone in wound bed. Otherwise wound bed viable no warmth erythema signs of acute infection.  Orthopedic: Left hallux and fifth toe amputations noted.   Radiographs: none Assessment:   1. 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  S/p L I&D of Abscess, Hallux Amputation, with debridement and wound subsequent partial closure -Continue antibiotics with hemodialysis -Has PCP f/u today -Wound again debrided down to healthy viable bone / patient tolerance with sterile tissue nipper. Medically necessary to perform in office to promote healing since patient refusing surgery. -Wound packed with iodoform plantarly. -Continue packing with HHC -Continue CAM Boot  Procedure: Excisional Debridement of Wound Rationale: Removal of non-viable soft tissue from the wound to promote healing.  Anesthesia: none Pre-Debridement Wound Measurements: 1 cm x 1 cm x 3 cm  Post-Debridement Wound Measurements: 1 cm x 1 cm x 3 cm  Type of Debridement: Sharp Excisional Tissue Removed: Non-viable soft tissue, bone Depth of Debridement: subcutaneous tissue, bone Technique: Sharp excisional debridement to bleeding, viable wound base.  Dressing: Dry, sterile, compression dressing. Disposition: Patient tolerated procedure well. Patient to return in 1 week for follow-up.      No follow-ups on file.

## 2019-05-25 NOTE — Progress Notes (Signed)
New Patient Office Visit  Subjective:  Patient ID: [name], male    DOB: 03/28/1981  Age: 38 y.o. MRN: [id number]  CC:  Chief Complaint  Patient presents with  . Hospitalization Follow-up    osteomyelitis of left foot     HPI [name] presents today to establish care. 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 Diabetes mellitus without complication ([place])   . Diabetic gastroparesis ([place])   . Dialysis patient ([place])   . Hypertension   . Renal disorder    Dialysis  . Sepsis [facility])     Past Surgical History:  Procedure Laterality Date  . AMPUTATION Right 02/02/2018   Procedure: RIGHT FIFTH TOE AND METATARSAL AMPUTATION. Filetted toe flap metatarsal resection. Debridement Plantar Foot wound;  Surgeon: Evelina Bucy, DPM;  Location: [place];  Service: Podiatry;  Laterality: Right;  . AMPUTATION Left 08/20/2018   Procedure: FIFTH METATARSAL BONE BIOPSY;  Surgeon: Evelina Bucy, DPM;  Location: [place];  Service: Podiatry;  Laterality: Left;  . AMPUTATION Left 10/28/2018   Procedure: LEFT GREAT TOE AMPUTATION;  Surgeon: Evelina Bucy, DPM;  Location: [place];  Service: Podiatry;  Laterality: Left;  . APPLICATION OF WOUND VAC  02/02/2018   Procedure: APPLICATION OF WOUND VAC  Right Foot;  Surgeon: Evelina Bucy, DPM;  Location: [place];  Service: Podiatry;;  . APPLICATION OF WOUND VAC Left 10/28/2018   Procedure: APPLICATION OF WOUND VAC LEFT TOE;  Surgeon: Evelina Bucy, DPM;  Location: [place];  Service: Podiatry;  Laterality: Left;  . APPLICATION OF WOUND VAC Left 11/01/2018   Procedure: APPLICATION OF WOUND VAC;  Surgeon: Evelina Bucy, DPM;  Location: [place];  Service: Podiatry;  Laterality: Left;  . AV FISTULA PLACEMENT     left arm.  . AV FISTULA PLACEMENT Right  12/22/2016   Procedure: INSERTION OF ARTERIOVENOUS (AV) GORE-TEX GRAFT ARM;  Surgeon: Elam Dutch, MD;  Location: [facility] OR;  Service: Vascular;  Laterality: Right;  . AV FISTULA PLACEMENT Left 05/26/2018   Procedure: INSERTION OF  ARTERIOVENOUS (AV) GORE-TEX GRAFT LEFT ARM;  Surgeon: Serafina Mitchell, MD;  Location: [place];  Service: Vascular;  Laterality: Left;  . EYE SURGERY    . I&D EXTREMITY Right 10/31/2017   Procedure: IRRIGATION AND DEBRIDEMENT RIGHT FOOT;  Surgeon: Evelina Bucy, DPM;  Location: [place];  Service: Podiatry;  Laterality: Right;  . I&D EXTREMITY Left 08/20/2018   Procedure: IRRIGATION AND DEBRIDEMENT EXTREMITY WITH SECONDARY WOUND CLOSUREAND APPLICATION OF WOUND VAC LEFT FOOT;  Surgeon: Evelina Bucy, DPM;  Location: [place];  Service: Podiatry;  Laterality: Left;  . I&D EXTREMITY Left 10/20/2018   Procedure: IRRIGATION AND DEBRIDEMENT LEFT FOOT  DEBRIDEMENT LATERAL FOOT WOUND;  Surgeon: Evelina Bucy, DPM;  Location: [place];  Service: Podiatry;  Laterality: Left;  . I&D EXTREMITY Left 10/28/2018   Procedure: IRRIGATION AND DEBRIDEMENT LEFT TOE;  Surgeon: Evelina Bucy, DPM;  Location: [place];  Service: Podiatry;  Laterality: Left;  . INSERTION OF DIALYSIS CATHETER     Right subclavian  . IR AV DIALY SHUNT INTRO NEEDLE/INTRACATH INITIAL W/PTA/IMG RIGHT Right 02/05/2018  . IR THROMBECTOMY AV FISTULA W/THROMBOLYSIS/PTA INC/SHUNT/IMG LEFT Left 08/24/2018  . IR THROMBECTOMY AV FISTULA W/THROMBOLYSIS/PTA INC/SHUNT/IMG LEFT Left 01/06/2019  .  IR US GUIDE VASC ACCESS LEFT  08/24/2018  . IR US GUIDE VASC ACCESS RIGHT  02/05/2018  . IRRIGATION AND DEBRIDEMENT FOOT Right 10/23/2018   Procedure: Irrigation and Debridement to tendon, Left Foot;  Surgeon: Evelina Bucy, DPM;  Location: [place];  Service: Podiatry;  Laterality: Right;  . IRRIGATION AND DEBRIDEMENT FOOT Left 11/01/2018   Procedure: IRRIGATION AND DEBRIDEMENT PARTIAL WOUND CLOSURE LOCAL TISSUE TRANSFER AND FLAP ROTATION,  LEFT FOOT;  Surgeon: Evelina Bucy, DPM;  Location: [place];  Service: Podiatry;  Laterality: Left;  . TRANSMETATARSAL AMPUTATION N/A 08/18/2018   Procedure: IRRIGATION AND DEBRIDEMENT OF LEFT 5TH TOE AND TRANSMETATARSAL, WITH PARTICAL LEFT 5TH TOE AND METATARSAL AMPUTATION, BONE BIOPSY, WOUND VAC APPLICATION.;  Surgeon: Evelina Bucy, DPM;  Location: [place];  Service: Podiatry;  Laterality: N/A;  . UPPER EXTREMITY VENOGRAPHY N/A 11/16/2016   Procedure: Upper Extremity Venography - Right Central;  Surgeon: Elam Dutch, MD;  Location: [place] [facility];  Service: Cardiovascular;  Laterality: N/A;  . UPPER EXTREMITY VENOGRAPHY N/A 05/25/2018   Procedure: UPPER EXTREMITY VENOGRAPHY - Bilateral;  Surgeon: Marty Heck, MD;  Location: [place] [facility];  Service: Cardiovascular;  Laterality: N/A;    Family History  Problem Relation Age of Onset  . Diabetes Mellitus II Other   . Diabetes Father   . Renal Disease Father        ESRD    Social History   Socioeconomic History  . Marital status: Single    Spouse name: Not on file  . Number of children: Not on file  . Years of education: Not on file  . Highest education level: Not on file  Occupational History  . Not on file  Social Needs  . Financial resource strain: Not on file  . Food insecurity    Worry: Not on file    Inability: Not on file  . Transportation needs    Medical: Not on file    Non-medical: Not on file  Tobacco Use  . Smoking status: Never Smoker  . Smokeless tobacco: Never Used  Substance and Sexual Activity  . Alcohol use: No  . Drug use: No  . Sexual activity: Never  Lifestyle  . Physical activity    Days per week: Not on file    Minutes per session: Not on file  . Stress: Not on file  Relationships  . Social Herbalist on phone: Not on file    Gets together: Not on file    Attends religious service: Not on file    Active member of club or organization: Not on file    Attends  meetings of clubs or organizations: Not on file    Relationship status: Not on file  . Intimate partner violence    Fear of current or ex partner: Not on file    Emotionally abused: Not on file    Physically abused: Not on file    Forced sexual activity: Not on file  Other Topics Concern  . Not on file  Social History Narrative  . Not on file    ROS Review of Systems  Endocrine: Positive for polydipsia, polyphagia and polyuria.  Musculoskeletal: Positive for gait problem.  Skin: Positive for wound.       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    Objective:   Today's Vitals:  Ht 5\' 7"  (1.702 m)   Wt 171 lb 12.8 oz (77.9 kg)   BMI 26.91 kg/m   Physical Exam Vitals signs reviewed.  Constitutional:      Appearance: Normal appearance.  HENT:     Head: Normocephalic.     Right Ear: Tympanic membrane normal.     Left Ear: Tympanic membrane normal.  Eyes:     Extraocular Movements: Extraocular movements intact.     Pupils: Pupils are equal, round, and reactive to light.  Neck:     Musculoskeletal: Normal range of motion and neck supple.  Cardiovascular:     Rate and Rhythm: Normal rate and regular rhythm.  Pulmonary:     Effort: Pulmonary effort is normal.     Breath sounds: Normal breath sounds.  Abdominal:     General: Abdomen is flat. Bowel sounds are normal.     Palpations: Abdomen is soft.  Musculoskeletal:     Comments: Unstable gait in a uner boot to protect wound  Neurological:     Mental Status: He is alert and oriented to person, place, and time.  Psychiatric:        Mood and Affect: Mood normal.     Assessment & Plan:   Problem List Items Addressed This Visit    None      Outpatient Encounter Medications as of 05/25/2019  Medication Sig  . calcitRIOL (ROCALTROL) 0.5 MCG capsule Take 5 capsules (2.5 mcg total) by mouth  every Monday, Wednesday, and Friday with hemodialysis.  Marland Kitchen cinacalcet (SENSIPAR) 30 MG tablet Take 30 mg by mouth daily with supper.  . insulin aspart (NOVOLOG) 100 UNIT/ML injection Inject 6-7 Units into the skin 2 (two) times daily with a meal.   . insulin glargine (LANTUS) 100 UNIT/ML injection Inject 0.15 mLs (15 Units total) into the skin at bedtime.  . lactobacillus acidophilus & bulgar (LACTINEX) chewable tablet Chew 1 tablet by mouth 3 (three) times daily with meals.  . metoCLOPramide (REGLAN) 5 MG tablet Take 5 mg by mouth 3 (three) times daily before meals.   . multivitamin (RENA-VIT) TABS tablet Take 1 tablet by mouth at bedtime.  . sevelamer carbonate (RENVELA) 800 MG tablet Take 2,400 mg by mouth See admin instructions. Take 5 times a day and two times a day with snacks  . sildenafil (VIAGRA) 100 MG tablet Take 100 mg by mouth daily as needed for erectile dysfunction.  . Vancomycin (VANCOCIN) 750-5 MG/150ML-% SOLN Inject 150 mLs (750 mg total) into the vein every Monday, Wednesday, and Friday with hemodialysis.  . Amino Acids-Protein Hydrolys (FEEDING SUPPLEMENT, PRO-STAT SUGAR FREE 64,) LIQD Take 30 mLs by mouth 2 (two) times a day.  Marland Kitchen aspirin 325 MG tablet Take 325 mg by mouth at bedtime.  . clonazePAM (KLONOPIN) 0.5 MG tablet Take 0.5 mg by mouth at bedtime.   . collagenase (SANTYL) ointment Apply 1 application topically daily. Left 1st metatarsal amputation site measuring 3.0 x 3.0 x 0.5cm (Patient not taking: Reported on 05/25/2019)  . glucose blood ([employer] TEST STRIPS) test strip by Other route 4 (four) times a day as needed.  Marland Kitchen losartan (COZAAR) 50 MG tablet Take 50 mg by mouth daily.  . silver sulfADIAZINE (SILVADENE) 1 % cream Apply pea-sized amount to wound daily.  . [DISCONTINUED] collagenase (SANTYL) ointment Apply 1 application topically daily.  . [DISCONTINUED] HYDROcodone-acetaminophen (NORCO/VICODIN) 5-325 MG tablet Take 1-2 tablets by mouth every 6 (six) hours as needed  for moderate pain or severe pain.  . [DISCONTINUED]  ondansetron (ZOFRAN ODT) 4 MG disintegrating tablet Take 1 tablet (4 mg total) by mouth every 8 (eight) hours as needed for nausea or vomiting.  . [DISCONTINUED] oxyCODONE (OXY IR/ROXICODONE) 5 MG immediate release tablet Take 5 mg by mouth every 4 (four) hours as needed for severe pain.  . [DISCONTINUED] promethazine (PHENERGAN) 25 MG tablet Take 25 mg by mouth every 6 (six) hours as needed for nausea or vomiting.  . [DISCONTINUED] traMADol (ULTRAM) 50 MG tablet Take 50 mg by mouth 2 (two) times daily.    No facility-administered encounter medications on file as of 05/25/2019.   Darnelle was seen today for hospitalization follow-up.  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  We also discussed the risks of such medications, including priapism, monocular vision loss, and hypotension with nitrates. All questions were answered. -     sildenafil (VIAGRA) 100 MG tablet; Take 1 tablet (100 mg total) by mouth daily as needed for erectile dysfunction.  Other orders -     insulin glargine (LANTUS) 100 UNIT/ML injection; Inject 0.15 mLs (15 Units total) into the skin at bedtime. -     insulin aspart (NOVOLOG) 100 UNIT/ML injection; Inject 6-7 Units into the skin 2 (two) times daily with a meal. -     metoCLOPramide (REGLAN) 5 MG  tablet; Take 1 tablet (5 mg total) by mouth 3 (three) times daily before meals.    Follow-up: No follow-ups on file.   Kerin Perna, NP

## 2019-05-25 NOTE — Patient Instructions (Signed)
Diabetes Mellitus and Foot Care Foot care is an important part of your health, especially when you have diabetes. Diabetes may cause you to have problems because of poor blood flow (circulation) to your feet and legs, which can cause your skin to:  Become thinner and drier.  Break more easily.  Heal more slowly.  Peel and crack. You may also have nerve damage (neuropathy) in your legs and feet, causing decreased feeling in them. This means that you may not notice minor injuries to your feet that could lead to more serious problems. Noticing and addressing any potential problems early is the best way to prevent future foot problems. How to care for your feet Foot hygiene  Wash your feet daily with warm water and mild soap. Do not use hot water. Then, pat your feet and the areas between your toes until they are completely dry. Do not soak your feet as this can dry your skin.  Trim your toenails straight across. Do not dig under them or around the cuticle. File the edges of your nails with an emery board or nail file.  Apply a moisturizing lotion or petroleum jelly to the skin on your feet and to dry, brittle toenails. Use lotion that does not contain alcohol and is unscented. Do not apply lotion between your toes. Shoes and socks  Wear clean socks or stockings every day. Make sure they are not too tight. Do not wear knee-high stockings since they may decrease blood flow to your legs.  Wear shoes that fit properly and have enough cushioning. Always look in your shoes before you put them on to be sure there are no objects inside.  To break in new shoes, wear them for just a few hours a day. This prevents injuries on your feet. Wounds, scrapes, corns, and calluses  Check your feet daily for blisters, cuts, bruises, sores, and redness. If you cannot see the bottom of your feet, use a mirror or ask someone for help.  Do not cut corns or calluses or try to remove them with medicine.  If you  find a minor scrape, cut, or break in the skin on your feet, keep it and the skin around it clean and dry. You may clean these areas with mild soap and water. Do not clean the area with peroxide, alcohol, or iodine.  If you have a wound, scrape, corn, or callus on your foot, look at it several times a day to make sure it is healing and not infected. Check for: ? Redness, swelling, or pain. ? Fluid or blood. ? Warmth. ? Pus or a bad smell. General instructions  Do not cross your legs. This may decrease blood flow to your feet.  Do not use heating pads or hot water bottles on your feet. They may burn your skin. If you have lost feeling in your feet or legs, you may not know this is happening until it is too late.  Protect your feet from hot and cold by wearing shoes, such as at the beach or on hot pavement.  Schedule a complete foot exam at least once a year (annually) or more often if you have foot problems. If you have foot problems, report any cuts, sores, or bruises to your health care provider immediately. Contact a health care provider if:  You have a medical condition that increases your risk of infection and you have any cuts, sores, or bruises on your feet.  You have an injury that is not   healing.  You have redness on your legs or feet.  You feel burning or tingling in your legs or feet.  You have pain or cramps in your legs and feet.  Your legs or feet are numb.  Your feet always feel cold.  You have pain around a toenail. Get help right away if:  You have a wound, scrape, corn, or callus on your foot and: ? You have pain, swelling, or redness that gets worse. ? You have fluid or blood coming from the wound, scrape, corn, or callus. ? Your wound, scrape, corn, or callus feels warm to the touch. ? You have pus or a bad smell coming from the wound, scrape, corn, or callus. ? You have a fever. ? You have a red line going up your leg. Summary  Check your feet every day  for cuts, sores, red spots, swelling, and blisters.  Moisturize feet and legs daily.  Wear shoes that fit properly and have enough cushioning.  If you have foot problems, report any cuts, sores, or bruises to your health care provider immediately.  Schedule a complete foot exam at least once a year (annually) or more often if you have foot problems. This information is not intended to replace advice given to you by your health care provider. Make sure you discuss any questions you have with your health care provider. Document Released: 09/11/2000 Document Revised: 10/27/2017 Document Reviewed: 10/16/2016 Elsevier Patient Education  2020 [employer]  

## 2019-05-25 NOTE — Progress Notes (Signed)
Subjective:  Patient ID: [name], male    DOB: 04/18/1981,  MRN: [id number]  Chief Complaint  Patient presents with  . Wound Check    Pt seen by Dr. March Rummage.    38 y.o. male presents for wound care follow-up. States he has an appt for his DM shoes. Reports compliance with boot. HHC has been coming and assisting with the wound.  Review of Systems: Negative except as noted in the HPI. Denies N/V/F/Ch.  Past Medical History:  Diagnosis Date  . Diabetes mellitus without complication ([place])   . Diabetic gastroparesis ([place])   . Dialysis patient ([place])   . Hypertension   . Renal disorder    Dialysis  . Sepsis ([place])     Current Outpatient Medications:  .  Amino Acids-Protein Hydrolys (FEEDING SUPPLEMENT, PRO-STAT SUGAR FREE 64,) LIQD, Take 30 mLs by mouth 2 (two) times a day., Disp: , Rfl:  .  aspirin 325 MG tablet, Take 325 mg by mouth at bedtime., Disp: , Rfl:  .  calcitRIOL (ROCALTROL) 0.5 MCG capsule, Take 5 capsules (2.5 mcg total) by mouth every Monday, Wednesday, and Friday with hemodialysis., Disp: 30 capsule, Rfl: 0 .  cinacalcet (SENSIPAR) 30 MG tablet, Take 30 mg by mouth daily with supper., Disp: , Rfl:  .  clonazePAM (KLONOPIN) 0.5 MG tablet, Take 0.5 mg by mouth at bedtime. , Disp: , Rfl:  .  collagenase (SANTYL) ointment, Apply 1 application topically daily., Disp: 15 g, Rfl: 0 .  collagenase (SANTYL) ointment, Apply 1 application topically daily. Left 1st metatarsal amputation site measuring 3.0 x 3.0 x 0.5cm, Disp: 15 g, Rfl: 5 .  glucose blood (KROGER TEST STRIPS) test strip, by Other route 4 (four) times a day as needed., Disp: , Rfl:  .  HYDROcodone-acetaminophen (NORCO/VICODIN) 5-325 MG tablet, Take 1-2 tablets by mouth every 6 (six) hours as needed for moderate pain or severe pain., Disp: 20 tablet, Rfl: 0 .  insulin aspart (NOVOLOG) 100 UNIT/ML injection, Inject 6-7 Units into the skin 2 (two) times daily with a meal. , Disp: , Rfl:  .  insulin glargine (LANTUS)  100 UNIT/ML injection, Inject 0.15 mLs (15 Units total) into the skin at bedtime., Disp: 10 mL, Rfl: 1 .  lactobacillus acidophilus & bulgar (LACTINEX) chewable tablet, Chew 1 tablet by mouth 3 (three) times daily with meals., Disp: , Rfl:  .  losartan (COZAAR) 50 MG tablet, Take 50 mg by mouth daily., Disp: , Rfl:  .  metoCLOPramide (REGLAN) 5 MG tablet, Take 5 mg by mouth 3 (three) times daily before meals. , Disp: , Rfl:  .  multivitamin (RENA-VIT) TABS tablet, Take 1 tablet by mouth at bedtime., Disp: 30 tablet, Rfl: 0 .  ondansetron (ZOFRAN ODT) 4 MG disintegrating tablet, Take 1 tablet (4 mg total) by mouth every 8 (eight) hours as needed for nausea or vomiting., Disp: 10 tablet, Rfl: 0 .  oxyCODONE (OXY IR/ROXICODONE) 5 MG immediate release tablet, Take 5 mg by mouth every 4 (four) hours as needed for severe pain., Disp: , Rfl:  .  promethazine (PHENERGAN) 25 MG tablet, Take 25 mg by mouth every 6 (six) hours as needed for nausea or vomiting., Disp: , Rfl:  .  sevelamer carbonate (RENVELA) 800 MG tablet, Take 2,400 mg by mouth See admin instructions. Take 5 times a day and two times a day with snacks, Disp: , Rfl:  .  sildenafil (VIAGRA) 100 MG tablet, Take 100 mg by mouth daily as needed for  erectile dysfunction., Disp: , Rfl:  .  silver sulfADIAZINE (SILVADENE) 1 % cream, Apply pea-sized amount to wound daily., Disp: 50 g, Rfl: 0 .  traMADol (ULTRAM) 50 MG tablet, Take 50 mg by mouth 2 (two) times daily. , Disp: , Rfl:  .  Vancomycin (VANCOCIN) 750-5 MG/150ML-% SOLN, Inject 150 mLs (750 mg total) into the vein every Monday, Wednesday, and Friday with hemodialysis., Disp: 4000 mL, Rfl: 0  Social History   Tobacco Use  Smoking Status Never Smoker  Smokeless Tobacco Never Used    No Known Allergies Objective:   Vitals:   05/18/19 0843  Temp: (!) 97.2 F (36.2 C)   There is no height or weight on file to calculate BMI. Constitutional Well developed.  Vascular Left foot warm to  touch edematous.  Edema noted to the leg  Neurologic Oriented to person, place, and time. Epicritic sensation to light touch grossly absent bilaterally.  Dermatologic  left first MPJ wound plantarly measuring 0.8x0.8 post-debridement. Dorsal wound pinpoint. Wound probes 4cm deep from plantar to dorsal  First metatarsal bone evident in wound bed.  Orthopedic: Left hallux and fifth toe amputations noted.   Radiographs: none Assessment:   1. 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  S/p L I&D of Abscess, Hallux Amputation, with debridement and wound subsequent partial closure -Continue antibiotics with hemodialysis -Discussed need to f/u with ID and PCP -Wound debrided down to healthy viable bone / patient tolerance. Medically necessary to perform in office to promote healing since patient refusing surgery. -Wound packed with iodoform packing from dorsal to plantar continuously. -Continue packing with HHC -Continue CAM Boot  Procedure: Excisional Debridement of Wound Rationale: Removal of non-viable soft tissue from the wound to promote healing.  Anesthesia: none Pre-Debridement Wound Measurements: 0.6 cm x 0.6 cm x 4 cm  Post-Debridement Wound Measurements: 0.8 cm x 0.8 cm x 0.4 cm  Type of Debridement: Sharp Excisional Tissue Removed: Non-viable soft tissue, bone Depth of Debridement: subcutaneous tissue, bone Technique: Sharp excisional debridement to bleeding, viable wound base.  Dressing: Dry, sterile, compression dressing. Disposition: Patient tolerated procedure well. Patient to return in 1 week for follow-up.    No follow-ups on file.

## 2019-05-25 NOTE — Progress Notes (Signed)
8.0 Needs diabetic shoes  Needs incontinence supplies

## 2019-05-26 ENCOUNTER — Telehealth: Payer: Self-pay | Admitting: *Deleted

## 2019-05-26 DIAGNOSIS — D509 Iron deficiency anemia, unspecified: Secondary | ICD-10-CM | POA: Diagnosis not present

## 2019-05-26 DIAGNOSIS — A4101 Sepsis due to Methicillin susceptible Staphylococcus aureus: Secondary | ICD-10-CM | POA: Diagnosis not present

## 2019-05-26 DIAGNOSIS — E1022 Type 1 diabetes mellitus with diabetic chronic kidney disease: Secondary | ICD-10-CM | POA: Diagnosis not present

## 2019-05-26 DIAGNOSIS — N2581 Secondary hyperparathyroidism of renal origin: Secondary | ICD-10-CM | POA: Diagnosis not present

## 2019-05-26 DIAGNOSIS — N186 End stage renal disease: Secondary | ICD-10-CM | POA: Diagnosis not present

## 2019-05-26 DIAGNOSIS — Z992 Dependence on renal dialysis: Secondary | ICD-10-CM | POA: Diagnosis not present

## 2019-05-26 NOTE — Telephone Encounter (Signed)
Noted thanks °

## 2019-05-26 NOTE — Telephone Encounter (Signed)
[place] states she is on vacation today, and will no be visiting pt, there are no nurses willing to go see pt today due to his treatment of them, pt will change the outer dressing, and she will see him on 05/29/2019.

## 2019-05-29 DIAGNOSIS — Z992 Dependence on renal dialysis: Secondary | ICD-10-CM | POA: Diagnosis not present

## 2019-05-29 DIAGNOSIS — N186 End stage renal disease: Secondary | ICD-10-CM | POA: Diagnosis not present

## 2019-05-29 DIAGNOSIS — A4101 Sepsis due to Methicillin susceptible Staphylococcus aureus: Secondary | ICD-10-CM | POA: Diagnosis not present

## 2019-05-29 DIAGNOSIS — N2581 Secondary hyperparathyroidism of renal origin: Secondary | ICD-10-CM | POA: Diagnosis not present

## 2019-05-29 DIAGNOSIS — E1022 Type 1 diabetes mellitus with diabetic chronic kidney disease: Secondary | ICD-10-CM | POA: Diagnosis not present

## 2019-05-29 DIAGNOSIS — D509 Iron deficiency anemia, unspecified: Secondary | ICD-10-CM | POA: Diagnosis not present

## 2019-05-30 DIAGNOSIS — I129 Hypertensive chronic kidney disease with stage 1 through stage 4 chronic kidney disease, or unspecified chronic kidney disease: Secondary | ICD-10-CM | POA: Diagnosis not present

## 2019-05-30 DIAGNOSIS — N186 End stage renal disease: Secondary | ICD-10-CM | POA: Diagnosis not present

## 2019-05-30 DIAGNOSIS — Z992 Dependence on renal dialysis: Secondary | ICD-10-CM | POA: Diagnosis not present

## 2019-05-31 DIAGNOSIS — E1022 Type 1 diabetes mellitus with diabetic chronic kidney disease: Secondary | ICD-10-CM | POA: Diagnosis not present

## 2019-05-31 DIAGNOSIS — N186 End stage renal disease: Secondary | ICD-10-CM | POA: Diagnosis not present

## 2019-05-31 DIAGNOSIS — N2581 Secondary hyperparathyroidism of renal origin: Secondary | ICD-10-CM | POA: Diagnosis not present

## 2019-05-31 DIAGNOSIS — D509 Iron deficiency anemia, unspecified: Secondary | ICD-10-CM | POA: Diagnosis not present

## 2019-05-31 DIAGNOSIS — Z992 Dependence on renal dialysis: Secondary | ICD-10-CM | POA: Diagnosis not present

## 2019-05-31 DIAGNOSIS — A4101 Sepsis due to Methicillin susceptible Staphylococcus aureus: Secondary | ICD-10-CM | POA: Diagnosis not present

## 2019-06-01 ENCOUNTER — Inpatient Hospital Stay: Payer: Medicare Other | Admitting: Family

## 2019-06-01 ENCOUNTER — Ambulatory Visit (INDEPENDENT_AMBULATORY_CARE_PROVIDER_SITE_OTHER): Payer: Medicare Other | Admitting: Podiatry

## 2019-06-01 ENCOUNTER — Other Ambulatory Visit: Payer: Self-pay

## 2019-06-01 DIAGNOSIS — Z4781 Encounter for orthopedic aftercare following surgical amputation: Secondary | ICD-10-CM | POA: Diagnosis not present

## 2019-06-01 DIAGNOSIS — E08621 Diabetes mellitus due to underlying condition with foot ulcer: Secondary | ICD-10-CM | POA: Diagnosis not present

## 2019-06-01 DIAGNOSIS — Z89412 Acquired absence of left great toe: Secondary | ICD-10-CM | POA: Diagnosis not present

## 2019-06-01 DIAGNOSIS — Z4800 Encounter for change or removal of nonsurgical wound dressing: Secondary | ICD-10-CM | POA: Diagnosis not present

## 2019-06-01 DIAGNOSIS — L97424 Non-pressure chronic ulcer of left heel and midfoot with necrosis of bone: Secondary | ICD-10-CM | POA: Diagnosis not present

## 2019-06-01 DIAGNOSIS — K3184 Gastroparesis: Secondary | ICD-10-CM | POA: Diagnosis not present

## 2019-06-01 DIAGNOSIS — I120 Hypertensive chronic kidney disease with stage 5 chronic kidney disease or end stage renal disease: Secondary | ICD-10-CM | POA: Diagnosis not present

## 2019-06-01 DIAGNOSIS — E1143 Type 2 diabetes mellitus with diabetic autonomic (poly)neuropathy: Secondary | ICD-10-CM | POA: Diagnosis not present

## 2019-06-01 DIAGNOSIS — E1169 Type 2 diabetes mellitus with other specified complication: Secondary | ICD-10-CM | POA: Diagnosis not present

## 2019-06-01 DIAGNOSIS — E11621 Type 2 diabetes mellitus with foot ulcer: Secondary | ICD-10-CM | POA: Diagnosis not present

## 2019-06-01 DIAGNOSIS — M86672 Other chronic osteomyelitis, left ankle and foot: Secondary | ICD-10-CM | POA: Diagnosis not present

## 2019-06-01 DIAGNOSIS — Z7982 Long term (current) use of aspirin: Secondary | ICD-10-CM | POA: Diagnosis not present

## 2019-06-01 DIAGNOSIS — E1122 Type 2 diabetes mellitus with diabetic chronic kidney disease: Secondary | ICD-10-CM | POA: Diagnosis not present

## 2019-06-01 DIAGNOSIS — Z792 Long term (current) use of antibiotics: Secondary | ICD-10-CM | POA: Diagnosis not present

## 2019-06-01 DIAGNOSIS — Z79891 Long term (current) use of opiate analgesic: Secondary | ICD-10-CM | POA: Diagnosis not present

## 2019-06-01 DIAGNOSIS — Z9119 Patient's noncompliance with other medical treatment and regimen: Secondary | ICD-10-CM | POA: Diagnosis not present

## 2019-06-01 DIAGNOSIS — N186 End stage renal disease: Secondary | ICD-10-CM | POA: Diagnosis not present

## 2019-06-01 DIAGNOSIS — Z794 Long term (current) use of insulin: Secondary | ICD-10-CM | POA: Diagnosis not present

## 2019-06-01 DIAGNOSIS — Z992 Dependence on renal dialysis: Secondary | ICD-10-CM | POA: Diagnosis not present

## 2019-06-02 ENCOUNTER — Telehealth (INDEPENDENT_AMBULATORY_CARE_PROVIDER_SITE_OTHER): Payer: Self-pay

## 2019-06-02 DIAGNOSIS — E1022 Type 1 diabetes mellitus with diabetic chronic kidney disease: Secondary | ICD-10-CM | POA: Diagnosis not present

## 2019-06-02 DIAGNOSIS — N2581 Secondary hyperparathyroidism of renal origin: Secondary | ICD-10-CM | POA: Diagnosis not present

## 2019-06-02 DIAGNOSIS — E11621 Type 2 diabetes mellitus with foot ulcer: Secondary | ICD-10-CM | POA: Diagnosis not present

## 2019-06-02 DIAGNOSIS — E1169 Type 2 diabetes mellitus with other specified complication: Secondary | ICD-10-CM | POA: Diagnosis not present

## 2019-06-02 DIAGNOSIS — D509 Iron deficiency anemia, unspecified: Secondary | ICD-10-CM | POA: Diagnosis not present

## 2019-06-02 DIAGNOSIS — Z89412 Acquired absence of left great toe: Secondary | ICD-10-CM | POA: Diagnosis not present

## 2019-06-02 DIAGNOSIS — A4101 Sepsis due to Methicillin susceptible Staphylococcus aureus: Secondary | ICD-10-CM | POA: Diagnosis not present

## 2019-06-02 DIAGNOSIS — N186 End stage renal disease: Secondary | ICD-10-CM | POA: Diagnosis not present

## 2019-06-02 DIAGNOSIS — Z4781 Encounter for orthopedic aftercare following surgical amputation: Secondary | ICD-10-CM | POA: Diagnosis not present

## 2019-06-02 DIAGNOSIS — L97424 Non-pressure chronic ulcer of left heel and midfoot with necrosis of bone: Secondary | ICD-10-CM | POA: Diagnosis not present

## 2019-06-02 DIAGNOSIS — Z992 Dependence on renal dialysis: Secondary | ICD-10-CM | POA: Diagnosis not present

## 2019-06-02 DIAGNOSIS — M86672 Other chronic osteomyelitis, left ankle and foot: Secondary | ICD-10-CM | POA: Diagnosis not present

## 2019-06-02 NOTE — Telephone Encounter (Signed)
[name] from [facility] called in regards to ordering continent supply for the patient. She states the patient needs medium size pull up briefs. [name] stated that the order need to have the diagnosis code.  Order needs to faxed to [phone]  Please advice   Thank you Whitney Post

## 2019-06-04 NOTE — Progress Notes (Signed)
Subjective:  Patient ID: [name], male    DOB: [birth year],  MRN: [id number]  Chief Complaint  Patient presents with  . Wound Check    pt is here for overall wound care, no changes to meds or allergies    38 y.o. male presents for wound care follow-up.No new complaints.  Review of Systems: Negative except as noted in the HPI. Denies N/V/F/Ch.  Past Medical History:  Diagnosis Date  . Diabetes mellitus without complication ([place])   . Diabetic gastroparesis ([place])   . Dialysis patient ([place])   . Hypertension   . Renal disorder    Dialysis  . Sepsis ([place])     Current Outpatient Medications:  .  Amino Acids-Protein Hydrolys (FEEDING SUPPLEMENT, PRO-STAT SUGAR FREE 64,) LIQD, Take 30 mLs by mouth 2 (two) times a day., Disp: , Rfl:  .  aspirin 325 MG tablet, Take 325 mg by mouth at bedtime., Disp: , Rfl:  .  calcitRIOL (ROCALTROL) 0.5 MCG capsule, Take 5 capsules (2.5 mcg total) by mouth every Monday, Wednesday, and Friday with hemodialysis., Disp: 30 capsule, Rfl: 0 .  cinacalcet (SENSIPAR) 30 MG tablet, Take 30 mg by mouth daily with supper., Disp: , Rfl:  .  clonazePAM (KLONOPIN) 0.5 MG tablet, Take 0.5 mg by mouth at bedtime. , Disp: , Rfl:  .  collagenase (SANTYL) ointment, Apply 1 application topically daily. Left 1st metatarsal amputation site measuring 3.0 x 3.0 x 0.5cm, Disp: 15 g, Rfl: 5 .  glucose blood (KROGER TEST STRIPS) test strip, by Other route 4 (four) times a day as needed., Disp: , Rfl:  .  insulin aspart (NOVOLOG) 100 UNIT/ML injection, Inject 6-7 Units into the skin 2 (two) times daily with a meal., Disp: 10 mL, Rfl: 3 .  insulin glargine (LANTUS) 100 UNIT/ML injection, Inject 0.15 mLs (15 Units total) into the skin at bedtime., Disp: 10 mL, Rfl: 3 .  lactobacillus acidophilus & bulgar (LACTINEX) chewable tablet, Chew 1 tablet by mouth 3 (three) times daily with meals., Disp: , Rfl:  .  losartan (COZAAR) 50 MG tablet, Take 50 mg by mouth daily., Disp: , Rfl:  .   metoCLOPramide (REGLAN) 5 MG tablet, Take 1 tablet (5 mg total) by mouth 3 (three) times daily before meals., Disp: 90 tablet, Rfl: 3 .  multivitamin (RENA-VIT) TABS tablet, Take 1 tablet by mouth at bedtime., Disp: 30 tablet, Rfl: 0 .  sevelamer carbonate (RENVELA) 800 MG tablet, Take 2,400 mg by mouth See admin instructions. Take 5 times a day and two times a day with snacks, Disp: , Rfl:  .  sildenafil (VIAGRA) 100 MG tablet, Take 1 tablet (100 mg total) by mouth daily as needed for erectile dysfunction., Disp: 10 tablet, Rfl: 3 .  silver sulfADIAZINE (SILVADENE) 1 % cream, Apply pea-sized amount to wound daily., Disp: 50 g, Rfl: 0 .  Vancomycin (VANCOCIN) 750-5 MG/150ML-% SOLN, Inject 150 mLs (750 mg total) into the vein every Monday, Wednesday, and Friday with hemodialysis., Disp: 4000 mL, Rfl: 0  Social History   Tobacco Use  Smoking Status Never Smoker  Smokeless Tobacco Never Used    No Known Allergies Objective:   There were no vitals filed for this visit. There is no height or weight on file to calculate BMI. Constitutional Well developed.  Vascular Left foot warm to touch edematous.  Edema noted to the leg  Neurologic Oriented to person, place, and time. Epicritic sensation to light touch grossly absent bilaterally.  Dermatologic Left 1st  MPJ wound plantarly 1x1x3 wound bed viable no warmth erythema signs of acute infection.  Orthopedic: Left hallux and fifth toe amputations noted.   Radiographs: none Assessment:   1. 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  S/p L I&D of Abscess, Hallux Amputation, with debridement and wound subsequent partial closure -Continue antibiotics with hemodialysis -Wound again debrided down to bone but no bone debrided today. Medically necessary to perform in office to promote healing since  patient refusing surgery. -Wound packed with iodoform plantarly. -Continue packing with HHC -Continue CAM Boot  Procedure: Excisional Debridement of Wound Rationale: Removal of non-viable soft tissue from the wound to promote healing.  Anesthesia: none Pre-Debridement Wound Measurements: 1 cm x 1 cm x 3 cm  Post-Debridement Wound Measurements: 1 cm x 1 cm x 3 cm  Type of Debridement: Sharp Excisional Tissue Removed: Non-viable soft tissue Depth of Debridement: subcutaneous tissue Technique: Sharp excisional debridement to bleeding, viable wound base.  Dressing: Dry, sterile, compression dressing. Disposition: Patient tolerated procedure well. Patient to return in 1 week for follow-up.         No follow-ups on file.

## 2019-06-05 DIAGNOSIS — A4101 Sepsis due to Methicillin susceptible Staphylococcus aureus: Secondary | ICD-10-CM | POA: Diagnosis not present

## 2019-06-05 DIAGNOSIS — D509 Iron deficiency anemia, unspecified: Secondary | ICD-10-CM | POA: Diagnosis not present

## 2019-06-05 DIAGNOSIS — N186 End stage renal disease: Secondary | ICD-10-CM | POA: Diagnosis not present

## 2019-06-05 DIAGNOSIS — E1022 Type 1 diabetes mellitus with diabetic chronic kidney disease: Secondary | ICD-10-CM | POA: Diagnosis not present

## 2019-06-05 DIAGNOSIS — N2581 Secondary hyperparathyroidism of renal origin: Secondary | ICD-10-CM | POA: Diagnosis not present

## 2019-06-05 DIAGNOSIS — Z992 Dependence on renal dialysis: Secondary | ICD-10-CM | POA: Diagnosis not present

## 2019-06-06 ENCOUNTER — Inpatient Hospital Stay: Payer: Medicare Other | Admitting: Family

## 2019-06-06 NOTE — Telephone Encounter (Signed)
Incontinence order faxed to [facility]. Patient can receive supplies through them at no cost. Supplies will be mailed directly to patients home. Nat Christen, CMA

## 2019-06-07 DIAGNOSIS — E1022 Type 1 diabetes mellitus with diabetic chronic kidney disease: Secondary | ICD-10-CM | POA: Diagnosis not present

## 2019-06-07 DIAGNOSIS — D509 Iron deficiency anemia, unspecified: Secondary | ICD-10-CM | POA: Diagnosis not present

## 2019-06-07 DIAGNOSIS — N2581 Secondary hyperparathyroidism of renal origin: Secondary | ICD-10-CM | POA: Diagnosis not present

## 2019-06-07 DIAGNOSIS — Z992 Dependence on renal dialysis: Secondary | ICD-10-CM | POA: Diagnosis not present

## 2019-06-07 DIAGNOSIS — A4101 Sepsis due to Methicillin susceptible Staphylococcus aureus: Secondary | ICD-10-CM | POA: Diagnosis not present

## 2019-06-07 DIAGNOSIS — N186 End stage renal disease: Secondary | ICD-10-CM | POA: Diagnosis not present

## 2019-06-08 ENCOUNTER — Ambulatory Visit (INDEPENDENT_AMBULATORY_CARE_PROVIDER_SITE_OTHER): Payer: Medicare Other | Admitting: Podiatry

## 2019-06-08 ENCOUNTER — Other Ambulatory Visit: Payer: Self-pay

## 2019-06-08 VITALS — Temp 97.8°F

## 2019-06-08 DIAGNOSIS — E08621 Diabetes mellitus due to underlying condition with foot ulcer: Secondary | ICD-10-CM

## 2019-06-08 DIAGNOSIS — L97424 Non-pressure chronic ulcer of left heel and midfoot with necrosis of bone: Secondary | ICD-10-CM

## 2019-06-09 DIAGNOSIS — N186 End stage renal disease: Secondary | ICD-10-CM | POA: Diagnosis not present

## 2019-06-09 DIAGNOSIS — A4101 Sepsis due to Methicillin susceptible Staphylococcus aureus: Secondary | ICD-10-CM | POA: Diagnosis not present

## 2019-06-09 DIAGNOSIS — Z992 Dependence on renal dialysis: Secondary | ICD-10-CM | POA: Diagnosis not present

## 2019-06-09 DIAGNOSIS — N2581 Secondary hyperparathyroidism of renal origin: Secondary | ICD-10-CM | POA: Diagnosis not present

## 2019-06-09 DIAGNOSIS — D509 Iron deficiency anemia, unspecified: Secondary | ICD-10-CM | POA: Diagnosis not present

## 2019-06-09 DIAGNOSIS — E1022 Type 1 diabetes mellitus with diabetic chronic kidney disease: Secondary | ICD-10-CM | POA: Diagnosis not present

## 2019-06-12 DIAGNOSIS — I871 Compression of vein: Secondary | ICD-10-CM | POA: Diagnosis not present

## 2019-06-12 DIAGNOSIS — D509 Iron deficiency anemia, unspecified: Secondary | ICD-10-CM | POA: Diagnosis not present

## 2019-06-12 DIAGNOSIS — T82868A Thrombosis of vascular prosthetic devices, implants and grafts, initial encounter: Secondary | ICD-10-CM | POA: Diagnosis not present

## 2019-06-12 DIAGNOSIS — A4101 Sepsis due to Methicillin susceptible Staphylococcus aureus: Secondary | ICD-10-CM | POA: Diagnosis not present

## 2019-06-12 DIAGNOSIS — N186 End stage renal disease: Secondary | ICD-10-CM | POA: Diagnosis not present

## 2019-06-12 DIAGNOSIS — E1022 Type 1 diabetes mellitus with diabetic chronic kidney disease: Secondary | ICD-10-CM | POA: Diagnosis not present

## 2019-06-12 DIAGNOSIS — N2581 Secondary hyperparathyroidism of renal origin: Secondary | ICD-10-CM | POA: Diagnosis not present

## 2019-06-12 DIAGNOSIS — Z992 Dependence on renal dialysis: Secondary | ICD-10-CM | POA: Diagnosis not present

## 2019-06-14 DIAGNOSIS — D509 Iron deficiency anemia, unspecified: Secondary | ICD-10-CM | POA: Diagnosis not present

## 2019-06-14 DIAGNOSIS — N2581 Secondary hyperparathyroidism of renal origin: Secondary | ICD-10-CM | POA: Diagnosis not present

## 2019-06-14 DIAGNOSIS — Z992 Dependence on renal dialysis: Secondary | ICD-10-CM | POA: Diagnosis not present

## 2019-06-14 DIAGNOSIS — A4101 Sepsis due to Methicillin susceptible Staphylococcus aureus: Secondary | ICD-10-CM | POA: Diagnosis not present

## 2019-06-14 DIAGNOSIS — E1022 Type 1 diabetes mellitus with diabetic chronic kidney disease: Secondary | ICD-10-CM | POA: Diagnosis not present

## 2019-06-14 DIAGNOSIS — N186 End stage renal disease: Secondary | ICD-10-CM | POA: Diagnosis not present

## 2019-06-15 ENCOUNTER — Ambulatory Visit: Payer: Medicare Other | Admitting: Podiatry

## 2019-06-16 DIAGNOSIS — N2581 Secondary hyperparathyroidism of renal origin: Secondary | ICD-10-CM | POA: Diagnosis not present

## 2019-06-16 DIAGNOSIS — N186 End stage renal disease: Secondary | ICD-10-CM | POA: Diagnosis not present

## 2019-06-16 DIAGNOSIS — D509 Iron deficiency anemia, unspecified: Secondary | ICD-10-CM | POA: Diagnosis not present

## 2019-06-16 DIAGNOSIS — E1022 Type 1 diabetes mellitus with diabetic chronic kidney disease: Secondary | ICD-10-CM | POA: Diagnosis not present

## 2019-06-16 DIAGNOSIS — A4101 Sepsis due to Methicillin susceptible Staphylococcus aureus: Secondary | ICD-10-CM | POA: Diagnosis not present

## 2019-06-16 DIAGNOSIS — Z992 Dependence on renal dialysis: Secondary | ICD-10-CM | POA: Diagnosis not present

## 2019-06-19 DIAGNOSIS — Z992 Dependence on renal dialysis: Secondary | ICD-10-CM | POA: Diagnosis not present

## 2019-06-19 DIAGNOSIS — N186 End stage renal disease: Secondary | ICD-10-CM | POA: Diagnosis not present

## 2019-06-19 DIAGNOSIS — A4101 Sepsis due to Methicillin susceptible Staphylococcus aureus: Secondary | ICD-10-CM | POA: Diagnosis not present

## 2019-06-19 DIAGNOSIS — N2581 Secondary hyperparathyroidism of renal origin: Secondary | ICD-10-CM | POA: Diagnosis not present

## 2019-06-19 DIAGNOSIS — D509 Iron deficiency anemia, unspecified: Secondary | ICD-10-CM | POA: Diagnosis not present

## 2019-06-19 DIAGNOSIS — E1022 Type 1 diabetes mellitus with diabetic chronic kidney disease: Secondary | ICD-10-CM | POA: Diagnosis not present

## 2019-06-21 DIAGNOSIS — A4101 Sepsis due to Methicillin susceptible Staphylococcus aureus: Secondary | ICD-10-CM | POA: Diagnosis not present

## 2019-06-21 DIAGNOSIS — E1022 Type 1 diabetes mellitus with diabetic chronic kidney disease: Secondary | ICD-10-CM | POA: Diagnosis not present

## 2019-06-21 DIAGNOSIS — N186 End stage renal disease: Secondary | ICD-10-CM | POA: Diagnosis not present

## 2019-06-21 DIAGNOSIS — D509 Iron deficiency anemia, unspecified: Secondary | ICD-10-CM | POA: Diagnosis not present

## 2019-06-21 DIAGNOSIS — N2581 Secondary hyperparathyroidism of renal origin: Secondary | ICD-10-CM | POA: Diagnosis not present

## 2019-06-21 DIAGNOSIS — Z992 Dependence on renal dialysis: Secondary | ICD-10-CM | POA: Diagnosis not present

## 2019-06-22 ENCOUNTER — Telehealth: Payer: Self-pay | Admitting: *Deleted

## 2019-06-22 ENCOUNTER — Other Ambulatory Visit: Payer: Self-pay

## 2019-06-22 ENCOUNTER — Ambulatory Visit (INDEPENDENT_AMBULATORY_CARE_PROVIDER_SITE_OTHER): Payer: Medicare Other | Admitting: Podiatry

## 2019-06-22 VITALS — Temp 97.9°F

## 2019-06-22 DIAGNOSIS — L97424 Non-pressure chronic ulcer of left heel and midfoot with necrosis of bone: Secondary | ICD-10-CM | POA: Diagnosis not present

## 2019-06-22 DIAGNOSIS — E08621 Diabetes mellitus due to underlying condition with foot ulcer: Secondary | ICD-10-CM

## 2019-06-22 NOTE — Telephone Encounter (Signed)
Can pack with prisma or similar absorbable silver collagen

## 2019-06-22 NOTE — Telephone Encounter (Signed)
[place] states the opening to the wound in pt's foot is so small she packed with iodoform, afraid other dressing may get lost in the wound.

## 2019-06-22 NOTE — Telephone Encounter (Signed)
I informed [facility] - [name] of Dr. Eleanora Neighbor 06/22/2019 1:04pm orders.

## 2019-06-23 DIAGNOSIS — N186 End stage renal disease: Secondary | ICD-10-CM | POA: Diagnosis not present

## 2019-06-23 DIAGNOSIS — D509 Iron deficiency anemia, unspecified: Secondary | ICD-10-CM | POA: Diagnosis not present

## 2019-06-23 DIAGNOSIS — E1022 Type 1 diabetes mellitus with diabetic chronic kidney disease: Secondary | ICD-10-CM | POA: Diagnosis not present

## 2019-06-23 DIAGNOSIS — N2581 Secondary hyperparathyroidism of renal origin: Secondary | ICD-10-CM | POA: Diagnosis not present

## 2019-06-23 DIAGNOSIS — A4101 Sepsis due to Methicillin susceptible Staphylococcus aureus: Secondary | ICD-10-CM | POA: Diagnosis not present

## 2019-06-23 DIAGNOSIS — Z992 Dependence on renal dialysis: Secondary | ICD-10-CM | POA: Diagnosis not present

## 2019-06-26 DIAGNOSIS — Z992 Dependence on renal dialysis: Secondary | ICD-10-CM | POA: Diagnosis not present

## 2019-06-26 DIAGNOSIS — E1022 Type 1 diabetes mellitus with diabetic chronic kidney disease: Secondary | ICD-10-CM | POA: Diagnosis not present

## 2019-06-26 DIAGNOSIS — N2581 Secondary hyperparathyroidism of renal origin: Secondary | ICD-10-CM | POA: Diagnosis not present

## 2019-06-26 DIAGNOSIS — N186 End stage renal disease: Secondary | ICD-10-CM | POA: Diagnosis not present

## 2019-06-26 DIAGNOSIS — A4101 Sepsis due to Methicillin susceptible Staphylococcus aureus: Secondary | ICD-10-CM | POA: Diagnosis not present

## 2019-06-26 DIAGNOSIS — D509 Iron deficiency anemia, unspecified: Secondary | ICD-10-CM | POA: Diagnosis not present

## 2019-06-27 ENCOUNTER — Ambulatory Visit: Payer: Medicare Other | Admitting: Endocrinology

## 2019-06-27 NOTE — Progress Notes (Signed)
Patient ID: [name], male   DOB: July 14, 1981, 38 y.o.   MRN: [id number]  Subjective:  Patient presents for followup.  Still receiving wound care with home healthcare.  Denies new issues to his left foot.  Has been wearing the boot.  Objective:   Constitutional Well developed. Well nourished.  Vascular Left foot warm and appears well perfused.  Neurologic Overall sensation diminished. Protective sensation absent.  Dermatologic Ulceration at the plantar aspect of the first metatarsal measuring 0.7 x 0.5 without warmth, erythema, or signs of acute infection. Does not appear to probe to bone today. Wound healed dorsally.  Musculoskeletal: Left hallux partial amputation noted. Left partial fifth ray amputation noted.    Assessment/Plan:  Patient was evaluated and treated and all questions answered.  1.  Left plantar foot ulceration. - Wound sharply excisionally debrided of all nonviable soft tissues with a 312 blade and tissue nipper.  Post debridement wound measuring 0.7 x 0.5.  Partial-thickness debridement.  Wound packed with Prisma.  Dry sterile dressing applied.  Continue CAM boot immobilization.  Once wound heals adequately, we will plan to place him into surgical shoe.  Discussed with the patient that he needs to follow up with a primary care physician as he only saw his PA at his last visit.  Follow up in two weeks for recheck.  Home healthcare orders sent for packing of the wound with Prisma.

## 2019-06-28 DIAGNOSIS — E1022 Type 1 diabetes mellitus with diabetic chronic kidney disease: Secondary | ICD-10-CM | POA: Diagnosis not present

## 2019-06-28 DIAGNOSIS — N186 End stage renal disease: Secondary | ICD-10-CM | POA: Diagnosis not present

## 2019-06-28 DIAGNOSIS — Z992 Dependence on renal dialysis: Secondary | ICD-10-CM | POA: Diagnosis not present

## 2019-06-28 DIAGNOSIS — A4101 Sepsis due to Methicillin susceptible Staphylococcus aureus: Secondary | ICD-10-CM | POA: Diagnosis not present

## 2019-06-28 DIAGNOSIS — D509 Iron deficiency anemia, unspecified: Secondary | ICD-10-CM | POA: Diagnosis not present

## 2019-06-28 DIAGNOSIS — N2581 Secondary hyperparathyroidism of renal origin: Secondary | ICD-10-CM | POA: Diagnosis not present

## 2019-06-28 NOTE — Progress Notes (Signed)
Subjective:  Patient ID: [name], male    DOB: 07/28/1981,  MRN: [id number]  Chief Complaint  Patient presents with  . Wound Check    Pt seen by Dr. March Rummage.    38 y.o. male presents for wound care follow-up. No new complaints. Wound improving per [name].  Review of Systems: Negative except as noted in the HPI. Denies N/V/F/Ch.  Past Medical History:  Diagnosis Date  . Diabetes mellitus without complication ([place])   . Diabetic gastroparesis ([place])   . Dialysis patient ([place])   . Hypertension   . Renal disorder    Dialysis  . Sepsis ([place])     Current Outpatient Medications:  .  Amino Acids-Protein Hydrolys (FEEDING SUPPLEMENT, PRO-STAT SUGAR FREE 64,) LIQD, Take 30 mLs by mouth 2 (two) times a day., Disp: , Rfl:  .  aspirin 325 MG tablet, Take 325 mg by mouth at bedtime., Disp: , Rfl:  .  calcitRIOL (ROCALTROL) 0.5 MCG capsule, Take 5 capsules (2.5 mcg total) by mouth every Monday, Wednesday, and Friday with hemodialysis., Disp: 30 capsule, Rfl: 0 .  cinacalcet (SENSIPAR) 30 MG tablet, Take 30 mg by mouth daily with supper., Disp: , Rfl:  .  clonazePAM (KLONOPIN) 0.5 MG tablet, Take 0.5 mg by mouth at bedtime. , Disp: , Rfl:  .  collagenase (SANTYL) ointment, Apply 1 application topically daily. Left 1st metatarsal amputation site measuring 3.0 x 3.0 x 0.5cm, Disp: 15 g, Rfl: 5 .  glucose blood (KROGER TEST STRIPS) test strip, by Other route 4 (four) times a day as needed., Disp: , Rfl:  .  heparin 1000 UNIT/ML injection, Heparin Sodium (Porcine) 1,000 Units/mL Systemic, Disp: , Rfl:  .  HYDROcodone-acetaminophen (NORCO) 5-325 MG tablet, Take by mouth., Disp: , Rfl:  .  insulin aspart (NOVOLOG) 100 UNIT/ML injection, Inject 6-7 Units into the skin 2 (two) times daily with a meal., Disp: 10 mL, Rfl: 3 .  insulin glargine (LANTUS) 100 UNIT/ML injection, Inject 0.15 mLs (15 Units total) into the skin at bedtime., Disp: 10 mL, Rfl: 3 .  lactobacillus acidophilus & bulgar  (LACTINEX) chewable tablet, Chew 1 tablet by mouth 3 (three) times daily with meals., Disp: , Rfl:  .  losartan (COZAAR) 50 MG tablet, Take 50 mg by mouth daily., Disp: , Rfl:  .  metoCLOPramide (REGLAN) 5 MG tablet, Take 1 tablet (5 mg total) by mouth 3 (three) times daily before meals., Disp: 90 tablet, Rfl: 3 .  multivitamin (RENA-VIT) TABS tablet, Take 1 tablet by mouth at bedtime., Disp: 30 tablet, Rfl: 0 .  ondansetron (ZOFRAN) 4 MG tablet, Take by mouth., Disp: , Rfl:  .  oxycodone (OXY-IR) 5 MG capsule, Take by mouth., Disp: , Rfl:  .  promethazine (PHENERGAN) 25 MG tablet, Take by mouth., Disp: , Rfl:  .  sevelamer carbonate (RENVELA) 800 MG tablet, Take 2,400 mg by mouth See admin instructions. Take 5 times a day and two times a day with snacks, Disp: , Rfl:  .  sildenafil (VIAGRA) 100 MG tablet, Take 1 tablet (100 mg total) by mouth daily as needed for erectile dysfunction., Disp: 10 tablet, Rfl: 3 .  silver sulfADIAZINE (SILVADENE) 1 % cream, Apply pea-sized amount to wound daily., Disp: 50 g, Rfl: 0 .  traMADol (ULTRAM) 50 MG tablet, Take by mouth., Disp: , Rfl:  .  Vancomycin (VANCOCIN) 750-5 MG/150ML-% SOLN, Inject 150 mLs (750 mg total) into the vein every Monday, Wednesday, and Friday with hemodialysis., Disp: 4000  mL, Rfl: 0  Social History   Tobacco Use  Smoking Status Never Smoker  Smokeless Tobacco Never Used    No Known Allergies Objective:   Vitals:   06/08/19 0847  Temp: 97.8 F (36.6 C)   There is no height or weight on file to calculate BMI. Constitutional Well developed.  Vascular Left foot warm to touch edematous.  Edema noted to the leg  Neurologic Oriented to person, place, and time. Epicritic sensation to light touch grossly absent bilaterally.  Dermatologic Left 1st MPJ wound plantarly 1x1x3 wound bed viable no warmth erythema signs of acute infection.  Orthopedic: Left hallux and fifth toe amputations noted.   Radiographs: none Assessment:   1.  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  S/p L I&D of Abscess, Hallux Amputation, with debridement and wound subsequent partial closure -Continue antibiotics with hemodialysis -Wound debrided only to Subq -Continue CAM boot. -Dressed with prisma packed into wound and DSD. -Continue above with HHC.  Procedure: Excisional Debridement of Wound Rationale: Removal of non-viable soft tissue from the wound to promote healing.  Anesthesia: none Pre-Debridement Wound Measurements: 0.5 cm x 0.5 cm x 0.3 cm  Post-Debridement Wound Measurements: 0.5 cm x 0.5 cm x 0.3 cm  Type of Debridement: Sharp Excisional Tissue Removed: Non-viable soft tissue Depth of Debridement: subcutaneous tissue. Technique: Sharp excisional debridement to bleeding, viable wound base.  Dressing: Dry, sterile, compression dressing. Disposition: Patient tolerated procedure well. Patient to return in 1 week for follow-up.        No follow-ups on file.

## 2019-06-29 DIAGNOSIS — I129 Hypertensive chronic kidney disease with stage 1 through stage 4 chronic kidney disease, or unspecified chronic kidney disease: Secondary | ICD-10-CM | POA: Diagnosis not present

## 2019-06-29 DIAGNOSIS — N186 End stage renal disease: Secondary | ICD-10-CM | POA: Diagnosis not present

## 2019-06-29 DIAGNOSIS — Z992 Dependence on renal dialysis: Secondary | ICD-10-CM | POA: Diagnosis not present

## 2019-06-30 DIAGNOSIS — R739 Hyperglycemia, unspecified: Secondary | ICD-10-CM | POA: Diagnosis not present

## 2019-06-30 DIAGNOSIS — Z992 Dependence on renal dialysis: Secondary | ICD-10-CM | POA: Diagnosis not present

## 2019-06-30 DIAGNOSIS — N2581 Secondary hyperparathyroidism of renal origin: Secondary | ICD-10-CM | POA: Diagnosis not present

## 2019-06-30 DIAGNOSIS — N186 End stage renal disease: Secondary | ICD-10-CM | POA: Diagnosis not present

## 2019-06-30 DIAGNOSIS — E1022 Type 1 diabetes mellitus with diabetic chronic kidney disease: Secondary | ICD-10-CM | POA: Diagnosis not present

## 2019-06-30 DIAGNOSIS — D509 Iron deficiency anemia, unspecified: Secondary | ICD-10-CM | POA: Diagnosis not present

## 2019-07-01 DIAGNOSIS — I120 Hypertensive chronic kidney disease with stage 5 chronic kidney disease or end stage renal disease: Secondary | ICD-10-CM | POA: Diagnosis not present

## 2019-07-01 DIAGNOSIS — E1169 Type 2 diabetes mellitus with other specified complication: Secondary | ICD-10-CM | POA: Diagnosis not present

## 2019-07-01 DIAGNOSIS — Z9181 History of falling: Secondary | ICD-10-CM | POA: Diagnosis not present

## 2019-07-01 DIAGNOSIS — Z79891 Long term (current) use of opiate analgesic: Secondary | ICD-10-CM | POA: Diagnosis not present

## 2019-07-01 DIAGNOSIS — Z794 Long term (current) use of insulin: Secondary | ICD-10-CM | POA: Diagnosis not present

## 2019-07-01 DIAGNOSIS — E1143 Type 2 diabetes mellitus with diabetic autonomic (poly)neuropathy: Secondary | ICD-10-CM | POA: Diagnosis not present

## 2019-07-01 DIAGNOSIS — N186 End stage renal disease: Secondary | ICD-10-CM | POA: Diagnosis not present

## 2019-07-01 DIAGNOSIS — Z89412 Acquired absence of left great toe: Secondary | ICD-10-CM | POA: Diagnosis not present

## 2019-07-01 DIAGNOSIS — Z7982 Long term (current) use of aspirin: Secondary | ICD-10-CM | POA: Diagnosis not present

## 2019-07-01 DIAGNOSIS — M86672 Other chronic osteomyelitis, left ankle and foot: Secondary | ICD-10-CM | POA: Diagnosis not present

## 2019-07-01 DIAGNOSIS — E11621 Type 2 diabetes mellitus with foot ulcer: Secondary | ICD-10-CM | POA: Diagnosis not present

## 2019-07-01 DIAGNOSIS — Z4800 Encounter for change or removal of nonsurgical wound dressing: Secondary | ICD-10-CM | POA: Diagnosis not present

## 2019-07-01 DIAGNOSIS — Z9119 Patient's noncompliance with other medical treatment and regimen: Secondary | ICD-10-CM | POA: Diagnosis not present

## 2019-07-01 DIAGNOSIS — Z992 Dependence on renal dialysis: Secondary | ICD-10-CM | POA: Diagnosis not present

## 2019-07-01 DIAGNOSIS — E1122 Type 2 diabetes mellitus with diabetic chronic kidney disease: Secondary | ICD-10-CM | POA: Diagnosis not present

## 2019-07-01 DIAGNOSIS — K3184 Gastroparesis: Secondary | ICD-10-CM | POA: Diagnosis not present

## 2019-07-01 DIAGNOSIS — L97424 Non-pressure chronic ulcer of left heel and midfoot with necrosis of bone: Secondary | ICD-10-CM | POA: Diagnosis not present

## 2019-07-03 DIAGNOSIS — Z992 Dependence on renal dialysis: Secondary | ICD-10-CM | POA: Diagnosis not present

## 2019-07-03 DIAGNOSIS — R739 Hyperglycemia, unspecified: Secondary | ICD-10-CM | POA: Diagnosis not present

## 2019-07-03 DIAGNOSIS — E1169 Type 2 diabetes mellitus with other specified complication: Secondary | ICD-10-CM | POA: Diagnosis not present

## 2019-07-03 DIAGNOSIS — D509 Iron deficiency anemia, unspecified: Secondary | ICD-10-CM | POA: Diagnosis not present

## 2019-07-03 DIAGNOSIS — E11621 Type 2 diabetes mellitus with foot ulcer: Secondary | ICD-10-CM | POA: Diagnosis not present

## 2019-07-03 DIAGNOSIS — N186 End stage renal disease: Secondary | ICD-10-CM | POA: Diagnosis not present

## 2019-07-03 DIAGNOSIS — I120 Hypertensive chronic kidney disease with stage 5 chronic kidney disease or end stage renal disease: Secondary | ICD-10-CM | POA: Diagnosis not present

## 2019-07-03 DIAGNOSIS — N2581 Secondary hyperparathyroidism of renal origin: Secondary | ICD-10-CM | POA: Diagnosis not present

## 2019-07-03 DIAGNOSIS — L97424 Non-pressure chronic ulcer of left heel and midfoot with necrosis of bone: Secondary | ICD-10-CM | POA: Diagnosis not present

## 2019-07-03 DIAGNOSIS — E1122 Type 2 diabetes mellitus with diabetic chronic kidney disease: Secondary | ICD-10-CM | POA: Diagnosis not present

## 2019-07-03 DIAGNOSIS — M86672 Other chronic osteomyelitis, left ankle and foot: Secondary | ICD-10-CM | POA: Diagnosis not present

## 2019-07-03 DIAGNOSIS — E1022 Type 1 diabetes mellitus with diabetic chronic kidney disease: Secondary | ICD-10-CM | POA: Diagnosis not present

## 2019-07-05 DIAGNOSIS — D509 Iron deficiency anemia, unspecified: Secondary | ICD-10-CM | POA: Diagnosis not present

## 2019-07-05 DIAGNOSIS — E1022 Type 1 diabetes mellitus with diabetic chronic kidney disease: Secondary | ICD-10-CM | POA: Diagnosis not present

## 2019-07-05 DIAGNOSIS — N2581 Secondary hyperparathyroidism of renal origin: Secondary | ICD-10-CM | POA: Diagnosis not present

## 2019-07-05 DIAGNOSIS — Z992 Dependence on renal dialysis: Secondary | ICD-10-CM | POA: Diagnosis not present

## 2019-07-05 DIAGNOSIS — N186 End stage renal disease: Secondary | ICD-10-CM | POA: Diagnosis not present

## 2019-07-05 DIAGNOSIS — R739 Hyperglycemia, unspecified: Secondary | ICD-10-CM | POA: Diagnosis not present

## 2019-07-06 ENCOUNTER — Ambulatory Visit: Payer: Medicare Other | Admitting: Podiatry

## 2019-07-07 DIAGNOSIS — N186 End stage renal disease: Secondary | ICD-10-CM | POA: Diagnosis not present

## 2019-07-07 DIAGNOSIS — E1022 Type 1 diabetes mellitus with diabetic chronic kidney disease: Secondary | ICD-10-CM | POA: Diagnosis not present

## 2019-07-07 DIAGNOSIS — D509 Iron deficiency anemia, unspecified: Secondary | ICD-10-CM | POA: Diagnosis not present

## 2019-07-07 DIAGNOSIS — N2581 Secondary hyperparathyroidism of renal origin: Secondary | ICD-10-CM | POA: Diagnosis not present

## 2019-07-07 DIAGNOSIS — R739 Hyperglycemia, unspecified: Secondary | ICD-10-CM | POA: Diagnosis not present

## 2019-07-07 DIAGNOSIS — Z992 Dependence on renal dialysis: Secondary | ICD-10-CM | POA: Diagnosis not present

## 2019-07-10 ENCOUNTER — Telehealth (INDEPENDENT_AMBULATORY_CARE_PROVIDER_SITE_OTHER): Payer: Self-pay

## 2019-07-10 ENCOUNTER — Other Ambulatory Visit (INDEPENDENT_AMBULATORY_CARE_PROVIDER_SITE_OTHER): Payer: Self-pay | Admitting: Primary Care

## 2019-07-10 DIAGNOSIS — D509 Iron deficiency anemia, unspecified: Secondary | ICD-10-CM | POA: Diagnosis not present

## 2019-07-10 DIAGNOSIS — E1022 Type 1 diabetes mellitus with diabetic chronic kidney disease: Secondary | ICD-10-CM | POA: Diagnosis not present

## 2019-07-10 DIAGNOSIS — N2581 Secondary hyperparathyroidism of renal origin: Secondary | ICD-10-CM | POA: Diagnosis not present

## 2019-07-10 DIAGNOSIS — N186 End stage renal disease: Secondary | ICD-10-CM | POA: Diagnosis not present

## 2019-07-10 DIAGNOSIS — Z992 Dependence on renal dialysis: Secondary | ICD-10-CM | POA: Diagnosis not present

## 2019-07-10 DIAGNOSIS — R739 Hyperglycemia, unspecified: Secondary | ICD-10-CM | POA: Diagnosis not present

## 2019-07-10 MED ORDER — ACCU-CHEK AVIVA PLUS VI STRP: ORAL_STRIP | 12 refills | Status: DC

## 2019-07-10 NOTE — Telephone Encounter (Signed)
Open in error

## 2019-07-11 ENCOUNTER — Other Ambulatory Visit (INDEPENDENT_AMBULATORY_CARE_PROVIDER_SITE_OTHER): Payer: Self-pay | Admitting: Primary Care

## 2019-07-11 MED ORDER — ACCU-CHEK AVIVA PLUS VI STRP: ORAL_STRIP | 12 refills | Status: DC

## 2019-07-12 DIAGNOSIS — Z992 Dependence on renal dialysis: Secondary | ICD-10-CM | POA: Diagnosis not present

## 2019-07-12 DIAGNOSIS — E1022 Type 1 diabetes mellitus with diabetic chronic kidney disease: Secondary | ICD-10-CM | POA: Diagnosis not present

## 2019-07-12 DIAGNOSIS — D509 Iron deficiency anemia, unspecified: Secondary | ICD-10-CM | POA: Diagnosis not present

## 2019-07-12 DIAGNOSIS — N186 End stage renal disease: Secondary | ICD-10-CM | POA: Diagnosis not present

## 2019-07-12 DIAGNOSIS — N2581 Secondary hyperparathyroidism of renal origin: Secondary | ICD-10-CM | POA: Diagnosis not present

## 2019-07-12 DIAGNOSIS — E1129 Type 2 diabetes mellitus with other diabetic kidney complication: Secondary | ICD-10-CM | POA: Diagnosis not present

## 2019-07-12 DIAGNOSIS — R739 Hyperglycemia, unspecified: Secondary | ICD-10-CM | POA: Diagnosis not present

## 2019-07-14 ENCOUNTER — Other Ambulatory Visit (INDEPENDENT_AMBULATORY_CARE_PROVIDER_SITE_OTHER): Payer: Self-pay | Admitting: Primary Care

## 2019-07-14 DIAGNOSIS — R739 Hyperglycemia, unspecified: Secondary | ICD-10-CM | POA: Diagnosis not present

## 2019-07-14 DIAGNOSIS — D509 Iron deficiency anemia, unspecified: Secondary | ICD-10-CM | POA: Diagnosis not present

## 2019-07-14 DIAGNOSIS — N186 End stage renal disease: Secondary | ICD-10-CM | POA: Diagnosis not present

## 2019-07-14 DIAGNOSIS — Z992 Dependence on renal dialysis: Secondary | ICD-10-CM | POA: Diagnosis not present

## 2019-07-14 DIAGNOSIS — N2581 Secondary hyperparathyroidism of renal origin: Secondary | ICD-10-CM | POA: Diagnosis not present

## 2019-07-14 DIAGNOSIS — E1022 Type 1 diabetes mellitus with diabetic chronic kidney disease: Secondary | ICD-10-CM | POA: Diagnosis not present

## 2019-07-14 MED ORDER — ACCU-CHEK AVIVA PLUS VI STRP: ORAL_STRIP | 12 refills | Status: DC

## 2019-07-17 DIAGNOSIS — Z992 Dependence on renal dialysis: Secondary | ICD-10-CM | POA: Diagnosis not present

## 2019-07-17 DIAGNOSIS — E1022 Type 1 diabetes mellitus with diabetic chronic kidney disease: Secondary | ICD-10-CM | POA: Diagnosis not present

## 2019-07-17 DIAGNOSIS — R739 Hyperglycemia, unspecified: Secondary | ICD-10-CM | POA: Diagnosis not present

## 2019-07-17 DIAGNOSIS — N186 End stage renal disease: Secondary | ICD-10-CM | POA: Diagnosis not present

## 2019-07-17 DIAGNOSIS — N2581 Secondary hyperparathyroidism of renal origin: Secondary | ICD-10-CM | POA: Diagnosis not present

## 2019-07-17 DIAGNOSIS — T7840XA Allergy, unspecified, initial encounter: Secondary | ICD-10-CM | POA: Insufficient documentation

## 2019-07-17 DIAGNOSIS — D509 Iron deficiency anemia, unspecified: Secondary | ICD-10-CM | POA: Diagnosis not present

## 2019-07-19 DIAGNOSIS — N186 End stage renal disease: Secondary | ICD-10-CM | POA: Diagnosis not present

## 2019-07-19 DIAGNOSIS — R739 Hyperglycemia, unspecified: Secondary | ICD-10-CM | POA: Diagnosis not present

## 2019-07-19 DIAGNOSIS — Z992 Dependence on renal dialysis: Secondary | ICD-10-CM | POA: Diagnosis not present

## 2019-07-19 DIAGNOSIS — N2581 Secondary hyperparathyroidism of renal origin: Secondary | ICD-10-CM | POA: Diagnosis not present

## 2019-07-19 DIAGNOSIS — D509 Iron deficiency anemia, unspecified: Secondary | ICD-10-CM | POA: Diagnosis not present

## 2019-07-19 DIAGNOSIS — E1022 Type 1 diabetes mellitus with diabetic chronic kidney disease: Secondary | ICD-10-CM | POA: Diagnosis not present

## 2019-07-21 ENCOUNTER — Telehealth: Payer: Self-pay | Admitting: *Deleted

## 2019-07-21 DIAGNOSIS — Z992 Dependence on renal dialysis: Secondary | ICD-10-CM | POA: Diagnosis not present

## 2019-07-21 DIAGNOSIS — N2581 Secondary hyperparathyroidism of renal origin: Secondary | ICD-10-CM | POA: Diagnosis not present

## 2019-07-21 DIAGNOSIS — D509 Iron deficiency anemia, unspecified: Secondary | ICD-10-CM | POA: Diagnosis not present

## 2019-07-21 DIAGNOSIS — R739 Hyperglycemia, unspecified: Secondary | ICD-10-CM | POA: Diagnosis not present

## 2019-07-21 DIAGNOSIS — N186 End stage renal disease: Secondary | ICD-10-CM | POA: Diagnosis not present

## 2019-07-21 DIAGNOSIS — E1022 Type 1 diabetes mellitus with diabetic chronic kidney disease: Secondary | ICD-10-CM | POA: Diagnosis not present

## 2019-07-21 NOTE — Telephone Encounter (Signed)
[place], she needed to know doctors orders for wound care, was going to decrease number of visits to 2/week due to how small the wound is.

## 2019-07-21 NOTE — Telephone Encounter (Signed)
I called [facility] states wound depth is 0.5 and will make appt for pt. I transferred to [place].

## 2019-07-24 DIAGNOSIS — N2581 Secondary hyperparathyroidism of renal origin: Secondary | ICD-10-CM | POA: Diagnosis not present

## 2019-07-24 DIAGNOSIS — Z992 Dependence on renal dialysis: Secondary | ICD-10-CM | POA: Diagnosis not present

## 2019-07-24 DIAGNOSIS — R739 Hyperglycemia, unspecified: Secondary | ICD-10-CM | POA: Diagnosis not present

## 2019-07-24 DIAGNOSIS — N186 End stage renal disease: Secondary | ICD-10-CM | POA: Diagnosis not present

## 2019-07-24 DIAGNOSIS — E1022 Type 1 diabetes mellitus with diabetic chronic kidney disease: Secondary | ICD-10-CM | POA: Diagnosis not present

## 2019-07-24 DIAGNOSIS — D509 Iron deficiency anemia, unspecified: Secondary | ICD-10-CM | POA: Diagnosis not present

## 2019-07-26 DIAGNOSIS — Z992 Dependence on renal dialysis: Secondary | ICD-10-CM | POA: Diagnosis not present

## 2019-07-26 DIAGNOSIS — D509 Iron deficiency anemia, unspecified: Secondary | ICD-10-CM | POA: Diagnosis not present

## 2019-07-26 DIAGNOSIS — R739 Hyperglycemia, unspecified: Secondary | ICD-10-CM | POA: Diagnosis not present

## 2019-07-26 DIAGNOSIS — N186 End stage renal disease: Secondary | ICD-10-CM | POA: Diagnosis not present

## 2019-07-26 DIAGNOSIS — E1022 Type 1 diabetes mellitus with diabetic chronic kidney disease: Secondary | ICD-10-CM | POA: Diagnosis not present

## 2019-07-26 DIAGNOSIS — N2581 Secondary hyperparathyroidism of renal origin: Secondary | ICD-10-CM | POA: Diagnosis not present

## 2019-07-27 ENCOUNTER — Ambulatory Visit (INDEPENDENT_AMBULATORY_CARE_PROVIDER_SITE_OTHER): Payer: Medicare Other | Admitting: Podiatry

## 2019-07-27 ENCOUNTER — Other Ambulatory Visit: Payer: Self-pay

## 2019-07-27 DIAGNOSIS — M86672 Other chronic osteomyelitis, left ankle and foot: Secondary | ICD-10-CM

## 2019-07-27 DIAGNOSIS — Z91199 Patient's noncompliance with other medical treatment and regimen due to unspecified reason: Secondary | ICD-10-CM

## 2019-07-27 DIAGNOSIS — Z9119 Patient's noncompliance with other medical treatment and regimen: Secondary | ICD-10-CM

## 2019-07-27 DIAGNOSIS — L97424 Non-pressure chronic ulcer of left heel and midfoot with necrosis of bone: Secondary | ICD-10-CM

## 2019-07-27 DIAGNOSIS — E08621 Diabetes mellitus due to underlying condition with foot ulcer: Secondary | ICD-10-CM | POA: Diagnosis not present

## 2019-07-27 NOTE — Progress Notes (Signed)
Patient ID: [name], male   DOB: [birth year], 38 y.o.   MRN: [id number]  Subjective:  Patient presents for followup.  Wearing a slide sandal instead of his surgical shoe. Objective:   Constitutional Well developed. Well nourished.  Vascular Left foot warm and appears well perfused.  Neurologic Overall sensation diminished. Protective sensation absent.  Dermatologic Ulceration at the plantar aspect of the first metatarsal pinpoint without deep probing. Chronic edema without warmth  Musculoskeletal: Left hallux partial amputation noted. Left partial fifth ray amputation noted.    Assessment/Plan:  Patient was evaluated and treated and all questions answered.  1.  Left plantar foot ulceration. - Wound essentially healed.  Selective debridement with 312 blade and tissue nipper.  Pinpoint ulcer noted upon debridement no deep ulcer.  Well plan to discontinue home health.  Discussed that the edema is likely due to previous infection versus possible Charcot changes; his foot is not warm or red and does not appear clinically infected.  2. Left 4th metatarsal callus -Sharply debrided with 312 blade without incident.  No open ulcer upon debridement -Discussed importance of compliance with his surgical shoe until he gets his diabetic shoes.  Discussed that he needs to still an MD or DO for prescription for his shoes.  Patient states that he has done this however upon review it appears that his provider is nurse practitioner.  We will arrange for staff to call patient to ensure that he is seen by a MD or DO so that the prescription can be written per Medicare rules.   Follow-up in 3 weeks x-rays at next visit

## 2019-07-28 ENCOUNTER — Telehealth: Payer: Self-pay | Admitting: *Deleted

## 2019-07-28 DIAGNOSIS — E1022 Type 1 diabetes mellitus with diabetic chronic kidney disease: Secondary | ICD-10-CM | POA: Diagnosis not present

## 2019-07-28 DIAGNOSIS — Z992 Dependence on renal dialysis: Secondary | ICD-10-CM | POA: Diagnosis not present

## 2019-07-28 DIAGNOSIS — N186 End stage renal disease: Secondary | ICD-10-CM | POA: Diagnosis not present

## 2019-07-28 DIAGNOSIS — N2581 Secondary hyperparathyroidism of renal origin: Secondary | ICD-10-CM | POA: Diagnosis not present

## 2019-07-28 DIAGNOSIS — D509 Iron deficiency anemia, unspecified: Secondary | ICD-10-CM | POA: Diagnosis not present

## 2019-07-28 DIAGNOSIS — R739 Hyperglycemia, unspecified: Secondary | ICD-10-CM | POA: Diagnosis not present

## 2019-07-28 NOTE — Telephone Encounter (Signed)
-----   Message from Evelina Bucy, DPM sent at 07/27/2019  7:31 PM EDT ----- Can we send updated orders for home health care.  Antibiotic ointment and bandage to the area for 1 additional week then DC.

## 2019-07-28 NOTE — Telephone Encounter (Signed)
Left message informing [name] - [facility] of Dr. Eleanora Neighbor 07/27/2019 7:31pm orders.

## 2019-07-30 DIAGNOSIS — N186 End stage renal disease: Secondary | ICD-10-CM | POA: Diagnosis not present

## 2019-07-30 DIAGNOSIS — I129 Hypertensive chronic kidney disease with stage 1 through stage 4 chronic kidney disease, or unspecified chronic kidney disease: Secondary | ICD-10-CM | POA: Diagnosis not present

## 2019-07-30 DIAGNOSIS — Z992 Dependence on renal dialysis: Secondary | ICD-10-CM | POA: Diagnosis not present

## 2019-07-31 DIAGNOSIS — M86672 Other chronic osteomyelitis, left ankle and foot: Secondary | ICD-10-CM | POA: Diagnosis not present

## 2019-07-31 DIAGNOSIS — Z79891 Long term (current) use of opiate analgesic: Secondary | ICD-10-CM | POA: Diagnosis not present

## 2019-07-31 DIAGNOSIS — Z794 Long term (current) use of insulin: Secondary | ICD-10-CM | POA: Diagnosis not present

## 2019-07-31 DIAGNOSIS — N186 End stage renal disease: Secondary | ICD-10-CM | POA: Diagnosis not present

## 2019-07-31 DIAGNOSIS — R739 Hyperglycemia, unspecified: Secondary | ICD-10-CM | POA: Diagnosis not present

## 2019-07-31 DIAGNOSIS — N2581 Secondary hyperparathyroidism of renal origin: Secondary | ICD-10-CM | POA: Diagnosis not present

## 2019-07-31 DIAGNOSIS — I120 Hypertensive chronic kidney disease with stage 5 chronic kidney disease or end stage renal disease: Secondary | ICD-10-CM | POA: Diagnosis not present

## 2019-07-31 DIAGNOSIS — E11621 Type 2 diabetes mellitus with foot ulcer: Secondary | ICD-10-CM | POA: Diagnosis not present

## 2019-07-31 DIAGNOSIS — E1143 Type 2 diabetes mellitus with diabetic autonomic (poly)neuropathy: Secondary | ICD-10-CM | POA: Diagnosis not present

## 2019-07-31 DIAGNOSIS — Z4800 Encounter for change or removal of nonsurgical wound dressing: Secondary | ICD-10-CM | POA: Diagnosis not present

## 2019-07-31 DIAGNOSIS — Z9181 History of falling: Secondary | ICD-10-CM | POA: Diagnosis not present

## 2019-07-31 DIAGNOSIS — L97424 Non-pressure chronic ulcer of left heel and midfoot with necrosis of bone: Secondary | ICD-10-CM | POA: Diagnosis not present

## 2019-07-31 DIAGNOSIS — K3184 Gastroparesis: Secondary | ICD-10-CM | POA: Diagnosis not present

## 2019-07-31 DIAGNOSIS — Z992 Dependence on renal dialysis: Secondary | ICD-10-CM | POA: Diagnosis not present

## 2019-07-31 DIAGNOSIS — E1022 Type 1 diabetes mellitus with diabetic chronic kidney disease: Secondary | ICD-10-CM | POA: Diagnosis not present

## 2019-07-31 DIAGNOSIS — E1122 Type 2 diabetes mellitus with diabetic chronic kidney disease: Secondary | ICD-10-CM | POA: Diagnosis not present

## 2019-07-31 DIAGNOSIS — Z89412 Acquired absence of left great toe: Secondary | ICD-10-CM | POA: Diagnosis not present

## 2019-07-31 DIAGNOSIS — Z9119 Patient's noncompliance with other medical treatment and regimen: Secondary | ICD-10-CM | POA: Diagnosis not present

## 2019-07-31 DIAGNOSIS — Z7982 Long term (current) use of aspirin: Secondary | ICD-10-CM | POA: Diagnosis not present

## 2019-07-31 DIAGNOSIS — E1169 Type 2 diabetes mellitus with other specified complication: Secondary | ICD-10-CM | POA: Diagnosis not present

## 2019-08-02 DIAGNOSIS — R739 Hyperglycemia, unspecified: Secondary | ICD-10-CM | POA: Diagnosis not present

## 2019-08-02 DIAGNOSIS — N186 End stage renal disease: Secondary | ICD-10-CM | POA: Diagnosis not present

## 2019-08-02 DIAGNOSIS — E1022 Type 1 diabetes mellitus with diabetic chronic kidney disease: Secondary | ICD-10-CM | POA: Diagnosis not present

## 2019-08-02 DIAGNOSIS — Z992 Dependence on renal dialysis: Secondary | ICD-10-CM | POA: Diagnosis not present

## 2019-08-02 DIAGNOSIS — N2581 Secondary hyperparathyroidism of renal origin: Secondary | ICD-10-CM | POA: Diagnosis not present

## 2019-08-04 DIAGNOSIS — L97424 Non-pressure chronic ulcer of left heel and midfoot with necrosis of bone: Secondary | ICD-10-CM | POA: Diagnosis not present

## 2019-08-04 DIAGNOSIS — Z992 Dependence on renal dialysis: Secondary | ICD-10-CM | POA: Diagnosis not present

## 2019-08-04 DIAGNOSIS — E1122 Type 2 diabetes mellitus with diabetic chronic kidney disease: Secondary | ICD-10-CM | POA: Diagnosis not present

## 2019-08-04 DIAGNOSIS — R739 Hyperglycemia, unspecified: Secondary | ICD-10-CM | POA: Diagnosis not present

## 2019-08-04 DIAGNOSIS — E1169 Type 2 diabetes mellitus with other specified complication: Secondary | ICD-10-CM | POA: Diagnosis not present

## 2019-08-04 DIAGNOSIS — N2581 Secondary hyperparathyroidism of renal origin: Secondary | ICD-10-CM | POA: Diagnosis not present

## 2019-08-04 DIAGNOSIS — E11621 Type 2 diabetes mellitus with foot ulcer: Secondary | ICD-10-CM | POA: Diagnosis not present

## 2019-08-04 DIAGNOSIS — E1022 Type 1 diabetes mellitus with diabetic chronic kidney disease: Secondary | ICD-10-CM | POA: Diagnosis not present

## 2019-08-04 DIAGNOSIS — M86672 Other chronic osteomyelitis, left ankle and foot: Secondary | ICD-10-CM | POA: Diagnosis not present

## 2019-08-04 DIAGNOSIS — I120 Hypertensive chronic kidney disease with stage 5 chronic kidney disease or end stage renal disease: Secondary | ICD-10-CM | POA: Diagnosis not present

## 2019-08-04 DIAGNOSIS — N186 End stage renal disease: Secondary | ICD-10-CM | POA: Diagnosis not present

## 2019-08-07 DIAGNOSIS — R739 Hyperglycemia, unspecified: Secondary | ICD-10-CM | POA: Diagnosis not present

## 2019-08-07 DIAGNOSIS — Z992 Dependence on renal dialysis: Secondary | ICD-10-CM | POA: Diagnosis not present

## 2019-08-07 DIAGNOSIS — N186 End stage renal disease: Secondary | ICD-10-CM | POA: Diagnosis not present

## 2019-08-07 DIAGNOSIS — E1022 Type 1 diabetes mellitus with diabetic chronic kidney disease: Secondary | ICD-10-CM | POA: Diagnosis not present

## 2019-08-07 DIAGNOSIS — N2581 Secondary hyperparathyroidism of renal origin: Secondary | ICD-10-CM | POA: Diagnosis not present

## 2019-08-09 DIAGNOSIS — E1022 Type 1 diabetes mellitus with diabetic chronic kidney disease: Secondary | ICD-10-CM | POA: Diagnosis not present

## 2019-08-09 DIAGNOSIS — N186 End stage renal disease: Secondary | ICD-10-CM | POA: Diagnosis not present

## 2019-08-09 DIAGNOSIS — R739 Hyperglycemia, unspecified: Secondary | ICD-10-CM | POA: Diagnosis not present

## 2019-08-09 DIAGNOSIS — N2581 Secondary hyperparathyroidism of renal origin: Secondary | ICD-10-CM | POA: Diagnosis not present

## 2019-08-09 DIAGNOSIS — Z992 Dependence on renal dialysis: Secondary | ICD-10-CM | POA: Diagnosis not present

## 2019-08-11 DIAGNOSIS — N2581 Secondary hyperparathyroidism of renal origin: Secondary | ICD-10-CM | POA: Diagnosis not present

## 2019-08-11 DIAGNOSIS — R739 Hyperglycemia, unspecified: Secondary | ICD-10-CM | POA: Diagnosis not present

## 2019-08-11 DIAGNOSIS — Z992 Dependence on renal dialysis: Secondary | ICD-10-CM | POA: Diagnosis not present

## 2019-08-11 DIAGNOSIS — N186 End stage renal disease: Secondary | ICD-10-CM | POA: Diagnosis not present

## 2019-08-11 DIAGNOSIS — E1022 Type 1 diabetes mellitus with diabetic chronic kidney disease: Secondary | ICD-10-CM | POA: Diagnosis not present

## 2019-08-14 DIAGNOSIS — R739 Hyperglycemia, unspecified: Secondary | ICD-10-CM | POA: Diagnosis not present

## 2019-08-14 DIAGNOSIS — Z992 Dependence on renal dialysis: Secondary | ICD-10-CM | POA: Diagnosis not present

## 2019-08-14 DIAGNOSIS — E1022 Type 1 diabetes mellitus with diabetic chronic kidney disease: Secondary | ICD-10-CM | POA: Diagnosis not present

## 2019-08-14 DIAGNOSIS — N2581 Secondary hyperparathyroidism of renal origin: Secondary | ICD-10-CM | POA: Diagnosis not present

## 2019-08-14 DIAGNOSIS — N186 End stage renal disease: Secondary | ICD-10-CM | POA: Diagnosis not present

## 2019-08-15 ENCOUNTER — Other Ambulatory Visit: Payer: Self-pay | Admitting: Nephrology

## 2019-08-15 DIAGNOSIS — D3000 Benign neoplasm of unspecified kidney: Secondary | ICD-10-CM

## 2019-08-16 DIAGNOSIS — N2581 Secondary hyperparathyroidism of renal origin: Secondary | ICD-10-CM | POA: Diagnosis not present

## 2019-08-16 DIAGNOSIS — Z992 Dependence on renal dialysis: Secondary | ICD-10-CM | POA: Diagnosis not present

## 2019-08-16 DIAGNOSIS — R739 Hyperglycemia, unspecified: Secondary | ICD-10-CM | POA: Diagnosis not present

## 2019-08-16 DIAGNOSIS — N186 End stage renal disease: Secondary | ICD-10-CM | POA: Diagnosis not present

## 2019-08-16 DIAGNOSIS — E1022 Type 1 diabetes mellitus with diabetic chronic kidney disease: Secondary | ICD-10-CM | POA: Diagnosis not present

## 2019-08-17 ENCOUNTER — Ambulatory Visit: Payer: Medicare Other | Admitting: Orthotics

## 2019-08-17 ENCOUNTER — Ambulatory Visit: Payer: Medicare Other | Admitting: Podiatry

## 2019-08-18 DIAGNOSIS — E1022 Type 1 diabetes mellitus with diabetic chronic kidney disease: Secondary | ICD-10-CM | POA: Diagnosis not present

## 2019-08-18 DIAGNOSIS — N186 End stage renal disease: Secondary | ICD-10-CM | POA: Diagnosis not present

## 2019-08-18 DIAGNOSIS — Z992 Dependence on renal dialysis: Secondary | ICD-10-CM | POA: Diagnosis not present

## 2019-08-18 DIAGNOSIS — R739 Hyperglycemia, unspecified: Secondary | ICD-10-CM | POA: Diagnosis not present

## 2019-08-18 DIAGNOSIS — N2581 Secondary hyperparathyroidism of renal origin: Secondary | ICD-10-CM | POA: Diagnosis not present

## 2019-08-22 ENCOUNTER — Ambulatory Visit (INDEPENDENT_AMBULATORY_CARE_PROVIDER_SITE_OTHER): Payer: Medicare Other | Admitting: Primary Care

## 2019-08-22 ENCOUNTER — Inpatient Hospital Stay: Admission: RE | Admit: 2019-08-22 | Payer: Medicare Other | Source: Ambulatory Visit

## 2019-08-22 DIAGNOSIS — Z992 Dependence on renal dialysis: Secondary | ICD-10-CM | POA: Diagnosis not present

## 2019-08-22 DIAGNOSIS — N2581 Secondary hyperparathyroidism of renal origin: Secondary | ICD-10-CM | POA: Diagnosis not present

## 2019-08-22 DIAGNOSIS — N186 End stage renal disease: Secondary | ICD-10-CM | POA: Diagnosis not present

## 2019-08-22 DIAGNOSIS — R739 Hyperglycemia, unspecified: Secondary | ICD-10-CM | POA: Diagnosis not present

## 2019-08-22 DIAGNOSIS — E1022 Type 1 diabetes mellitus with diabetic chronic kidney disease: Secondary | ICD-10-CM | POA: Diagnosis not present

## 2019-08-25 DIAGNOSIS — N2581 Secondary hyperparathyroidism of renal origin: Secondary | ICD-10-CM | POA: Diagnosis not present

## 2019-08-25 DIAGNOSIS — Z992 Dependence on renal dialysis: Secondary | ICD-10-CM | POA: Diagnosis not present

## 2019-08-25 DIAGNOSIS — R739 Hyperglycemia, unspecified: Secondary | ICD-10-CM | POA: Diagnosis not present

## 2019-08-25 DIAGNOSIS — E1022 Type 1 diabetes mellitus with diabetic chronic kidney disease: Secondary | ICD-10-CM | POA: Diagnosis not present

## 2019-08-25 DIAGNOSIS — N186 End stage renal disease: Secondary | ICD-10-CM | POA: Diagnosis not present

## 2019-08-28 DIAGNOSIS — Z992 Dependence on renal dialysis: Secondary | ICD-10-CM | POA: Diagnosis not present

## 2019-08-28 DIAGNOSIS — N2581 Secondary hyperparathyroidism of renal origin: Secondary | ICD-10-CM | POA: Diagnosis not present

## 2019-08-28 DIAGNOSIS — R739 Hyperglycemia, unspecified: Secondary | ICD-10-CM | POA: Diagnosis not present

## 2019-08-28 DIAGNOSIS — N186 End stage renal disease: Secondary | ICD-10-CM | POA: Diagnosis not present

## 2019-08-28 DIAGNOSIS — E1022 Type 1 diabetes mellitus with diabetic chronic kidney disease: Secondary | ICD-10-CM | POA: Diagnosis not present

## 2019-09-25 ENCOUNTER — Other Ambulatory Visit (INDEPENDENT_AMBULATORY_CARE_PROVIDER_SITE_OTHER): Payer: Self-pay | Admitting: Primary Care

## 2019-09-25 MED ORDER — ACCU-CHEK AVIVA PLUS VI STRP: ORAL_STRIP | 12 refills | Status: DC

## 2019-10-04 ENCOUNTER — Other Ambulatory Visit (INDEPENDENT_AMBULATORY_CARE_PROVIDER_SITE_OTHER): Payer: Self-pay | Admitting: Primary Care

## 2019-10-04 MED ORDER — ACCU-CHEK AVIVA PLUS VI STRP: ORAL_STRIP | 12 refills | Status: DC

## 2019-11-09 ENCOUNTER — Other Ambulatory Visit (INDEPENDENT_AMBULATORY_CARE_PROVIDER_SITE_OTHER): Payer: Self-pay | Admitting: Primary Care

## 2019-11-10 NOTE — Telephone Encounter (Signed)
Sent to PCP ?

## 2019-12-12 ENCOUNTER — Telehealth: Payer: Self-pay | Admitting: *Deleted

## 2019-12-12 ENCOUNTER — Encounter: Payer: Self-pay | Admitting: Sports Medicine

## 2019-12-12 ENCOUNTER — Ambulatory Visit (INDEPENDENT_AMBULATORY_CARE_PROVIDER_SITE_OTHER): Payer: Medicare Other

## 2019-12-12 ENCOUNTER — Other Ambulatory Visit: Payer: Self-pay

## 2019-12-12 ENCOUNTER — Other Ambulatory Visit: Payer: Self-pay | Admitting: Sports Medicine

## 2019-12-12 ENCOUNTER — Ambulatory Visit (INDEPENDENT_AMBULATORY_CARE_PROVIDER_SITE_OTHER): Payer: Medicare Other | Admitting: Sports Medicine

## 2019-12-12 VITALS — Temp 96.5°F

## 2019-12-12 DIAGNOSIS — L97511 Non-pressure chronic ulcer of other part of right foot limited to breakdown of skin: Secondary | ICD-10-CM | POA: Diagnosis not present

## 2019-12-12 DIAGNOSIS — Z899 Acquired absence of limb, unspecified: Secondary | ICD-10-CM

## 2019-12-12 DIAGNOSIS — E1142 Type 2 diabetes mellitus with diabetic polyneuropathy: Secondary | ICD-10-CM

## 2019-12-12 DIAGNOSIS — Z8739 Personal history of other diseases of the musculoskeletal system and connective tissue: Secondary | ICD-10-CM

## 2019-12-12 MED ORDER — SULFAMETHOXAZOLE-TRIMETHOPRIM 400-80 MG PO TABS: 1.0000 | ORAL_TABLET | Freq: Two times a day (BID) | ORAL | 0 refills | Status: DC

## 2019-12-12 NOTE — Progress Notes (Signed)
Subjective: [name] is a 39 y.o. male patient seen in office for evaluation of ulceration of the Right foot that he noticed two days ago when he saw bleeding, applied peroxide to area and reports that he decided to come in sooner and not wait for Dr. March Rummage because of his history with infection in the past. Denies nausea/fever/vomiting/chills/night sweats/shortness of breath/pain. Patient is on HD. Patient has no other pedal complaints at this time.  Patient Active Problem List   Diagnosis Date Noted  . Diarrhea 04/24/2019  . Bone erosion determined by x-ray   . Encephalopathy acute   . 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 Open wound of left foot   . Encounter for orthopedic aftercare following surgical amputation   . Foot infection   . Gangrene of toe of left foot ([place])   . Cellulitis 10/26/2018  . 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 Nausea vomiting and diarrhea 10/20/2018  . Septic arthritis of left foot ([place])   . DM (diabetes mellitus), secondary, uncontrolled, with neurologic complications ([place])   . Headache 09/23/2018  . Osteomyelitis of foot, left, acute ([place])   . Acute osteomyelitis involving ankle and foot, left ([place])   . Osteomyelitis of fifth toe of left foot ([place])   . Anemia of chronic disease 08/18/2018  . Encounter for immunization 06/29/2018  . Unspecified protein-calorie malnutrition ([place]) 02/14/2018  . Encounter for removal of sutures 02/07/2018  . Osteomyelitis ([place]) 02/01/2018  . Fluid overload, unspecified 01/22/2018  . Osteomyelitis of ankle or foot, acute, right ([place]) 10/30/2017  . Cellulitis and abscess of toe of left foot   . Diabetic foot infection ([place])   . Insulin dependent diabetes mellitus 10/25/2017  . Diabetic gastroparesis ([place]) 10/25/2017  .  Sepsis ([place]) 10/25/2017  . Fever, unspecified 10/28/2016  . Iron deficiency anemia, unspecified 10/28/2016  . 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 Pruritus, unspecified 10/28/2016  . Secondary hyperparathyroidism of renal origin ([place]) 10/28/2016  . Shortness of breath 10/28/2016  . HTN (hypertension) 08/09/2014  . ESRD on dialysis ([place]) 03/19/2014  . MSSA bacteremia 05/31/2013  . Metabolic acidosis 48/18/5631   Current Outpatient Medications on File Prior to Visit  Medication Sig Dispense Refill  . Amino Acids-Protein Hydrolys (PRO-STAT) LIQD Take by mouth.    Marland Kitchen aspirin 325 MG tablet Take by mouth.    . cinacalcet (SENSIPAR) 60 MG tablet Take by mouth.    . clonazePAM (KLONOPIN) 0.5 MG tablet Take by mouth.    . collagenase (SANTYL) ointment Apply topically.    Marland Kitchen glucose blood (ACCU-CHEK AVIVA PLUS) test strip Accu-Chek Aviva Plus test strp    . insulin glargine (LANTUS) 100 UNIT/ML injection Inject into the skin.    . Lactobacillus (ACIDOPHILUS) CAPS capsule Take by mouth.    . losartan (COZAAR) 50 MG tablet Take by mouth.    . metoCLOPramide (REGLAN) 5 MG tablet Take by mouth.    . multivitamin (RENA-VIT) TABS tablet Take by mouth.    . ondansetron (ZOFRAN) 4 MG tablet Take by mouth.    Marland Kitchen oxycodone (OXY-IR) 5 MG capsule Take by mouth.    . promethazine (PHENERGAN) 25 MG tablet Take by mouth.    . sevelamer carbonate (RENVELA) 800 MG tablet Take by mouth.    . sildenafil (VIAGRA) 100 MG  tablet Take by mouth.    . silver sulfADIAZINE (SILVADENE) 1 % cream Apply topically.    . traMADol (ULTRAM) 50 MG tablet Take by mouth.    . calcitRIOL (ROCALTROL) 0.5 MCG capsule Take 5 capsules (2.5 mcg total) by mouth every Monday, Wednesday, and Friday with hemodialysis. 30 capsule 0  . heparin 1000 UNIT/ML injection Heparin Sodium (Porcine) 1,000 Units/mL Systemic    . NOVOLOG 100 UNIT/ML injection INJECT 6 TO 7 UNITS SUBCUTANEOUSLY TWICE DAILY WITH A MEAL 10 mL 0  .  Vancomycin (VANCOCIN) 750-5 MG/150ML-% SOLN Inject 150 mLs (750 mg total) into the vein every Monday, Wednesday, and Friday with hemodialysis. 4000 mL 0   No current facility-administered medications on file prior to visit.   No Known Allergies  No results found for this or any previous visit (from the past 2160 hour(s)).  Objective: There were no vitals filed for this visit.  General: Patient is awake, alert, oriented x 3 and in no acute distress.  Dermatology: Skin is warm and dry bilateral with a full thickness ulceration present  Ball of forefoot on right. Ulceration measures 2 cm x 3cm x 0.3 cm. There is a  Keratotic border with a macerated base. The ulceration does not  probe to bone. There is malodor, no active drainage, no erythema, no edema. No other acute signs of infection.   Vascular: Dorsalis Pedis pulse =1 /4 Bilateral,  Posterior Tibial pulse = 1/4 Bilateral,  Capillary Fill Time < 5 seconds  Neurologic: Protective sensation absent bilateral.  Musculosketal: + deformity with history of multiple digital amputations   Xrays, Right foot:Sub met 2-3 No bony destruction suggestive of osteomyelitis. No gas in soft tissues.   Recent Labs    04/24/19 0915 04/24/19 1939  GRAMSTAIN  --  NO WBC SEEN FEW GRAM POSITIVE COCCI   LABORGA STAPHYLOCOCCUS AUREUS STAPHYLOCOCCUS AUREUS  ENTEROCOCCUS FAECALIS    Assessment and Plan:  Problem List Items Addressed This Visit    None    Visit Diagnoses    Ulcer of right foot, limited to breakdown of skin ([place])    -  Primary   Relevant Orders   DG Foot Complete Right (Completed)   WOUND CULTURE   Diabetic polyneuropathy associated with type 2 diabetes mellitus ([name])       Relevant Medications   aspirin 325 MG tablet   clonazePAM (KLONOPIN) 0.5 MG tablet   insulin glargine (LANTUS) 100 UNIT/ML injection   losartan (COZAAR) 50 MG tablet   History of amputation       History of osteomyelitis           -Examined patient and  discussed the progression of the wound and treatment alternatives. -Xrays reviewed - Excisionally dedbrided ulceration at right to healthy bleeding borders removing nonviable tissue using a sterile chisel blade. Wound measures post debridement as above, Wound was debrided to the level of the dermis with viable wound base exposed to promote healing. Hemostasis was achieved with manuel pressure. Patient tolerated procedure well without any discomfort or anesthesia necessary for this wound debridement.  -Wound culture obtained, started patient on bactrim to adjust based on culture results -Applied Iodosorb and dry sterile dressing and instructed patient to continue with daily dressings at home consisting of same with help from home nurse, Referral sent to [facility]. - Advised patient to go to the ER or return to office if the wound worsens or if constitutional symptoms are present. -Dispensed post op shoe to use as  instructed on right -Patient to return to office in 1 week for follow up care and evaluation or sooner if problems arise.  Landis Martins, DPM

## 2019-12-12 NOTE — Addendum Note (Signed)
Addended byDeidre Ala, Kaileen Bronkema L on: 12/12/2019 04:57 PM   Modules accepted: Orders

## 2019-12-12 NOTE — Telephone Encounter (Signed)
[facility] states pt's [facility] was closed 07/2019. Faxed reinstatement request form, clinicals and demographics to [facility].

## 2019-12-12 NOTE — Telephone Encounter (Signed)
Ivette Loyal D, RN  Phone Number: [phone]  Reina Fuse!   Hope you are well! We did receive the home health referral for Mr. Medlen and we are able to help with him again. We have had some issues with him and his caregiver in the past so we will ask them to sign a behavioral contract at the start of care visit and if they exhibit non-compliance, hositility, or swearing at our staff again, we will have to discharge the patient. We appreciate you letting us be a part of the care contiuum for your patients

## 2019-12-12 NOTE — Telephone Encounter (Signed)
-----   Message from Landis Martins, [place] sent at 12/12/2019 12:30 PM EDT ----- Regarding: [facility] Patient prefers nurse [name] Wound care: iodosorb and dry dressing to right foot ulcer 2-3 x per week Fax [phone]

## 2019-12-14 DIAGNOSIS — L97411 Non-pressure chronic ulcer of right heel and midfoot limited to breakdown of skin: Secondary | ICD-10-CM | POA: Diagnosis not present

## 2019-12-14 DIAGNOSIS — E1122 Type 2 diabetes mellitus with diabetic chronic kidney disease: Secondary | ICD-10-CM | POA: Diagnosis not present

## 2019-12-15 ENCOUNTER — Other Ambulatory Visit (INDEPENDENT_AMBULATORY_CARE_PROVIDER_SITE_OTHER): Payer: Self-pay | Admitting: Primary Care

## 2019-12-15 NOTE — Telephone Encounter (Signed)
Patient last seen in august of 2020. Has been given one refill in February. Please fill if appropriate.

## 2019-12-18 LAB — WOUND CULTURE
MICRO NUMBER:: 10257089
SPECIMEN QUALITY:: ADEQUATE

## 2019-12-18 LAB — HOUSE ACCOUNT TRACKING

## 2019-12-21 ENCOUNTER — Ambulatory Visit (INDEPENDENT_AMBULATORY_CARE_PROVIDER_SITE_OTHER): Payer: Medicare Other | Admitting: Podiatry

## 2019-12-21 DIAGNOSIS — Z5329 Procedure and treatment not carried out because of patient's decision for other reasons: Secondary | ICD-10-CM

## 2019-12-21 NOTE — Progress Notes (Signed)
Patient called to no show for appt. Same day cancellation. It does not appear he made another appointment.

## 2020-01-22 ENCOUNTER — Telehealth: Payer: Self-pay | Admitting: *Deleted

## 2020-01-22 NOTE — Telephone Encounter (Signed)
Wells states she is seeing for a callous wound he had picked and she had showed him how to dress the wounds received in a fall, but he will not return her phone calls, so she will be discharging him.

## 2020-01-25 ENCOUNTER — Other Ambulatory Visit: Payer: Medicare Other

## 2020-01-27 ENCOUNTER — Ambulatory Visit (HOSPITAL_COMMUNITY)
Admission: EM | Admit: 2020-01-27 | Discharge: 2020-01-27 | Disposition: A | Discharge status: Home / Self Care | Payer: Medicare Other | Source: Home / Self Care

## 2020-01-27 ENCOUNTER — Encounter (HOSPITAL_COMMUNITY): Payer: Self-pay | Admitting: Emergency Medicine

## 2020-01-27 ENCOUNTER — Other Ambulatory Visit: Payer: Self-pay

## 2020-01-27 ENCOUNTER — Emergency Department (HOSPITAL_COMMUNITY): Payer: Medicare Other

## 2020-01-27 ENCOUNTER — Emergency Department (HOSPITAL_COMMUNITY)
Admission: EM | Admit: 2020-01-27 | Discharge: 2020-01-27 | Disposition: A | Discharge status: Home / Self Care | Payer: Medicare Other | Attending: Emergency Medicine | Admitting: Emergency Medicine

## 2020-01-27 DIAGNOSIS — Z992 Dependence on renal dialysis: Secondary | ICD-10-CM | POA: Insufficient documentation

## 2020-01-27 DIAGNOSIS — L089 Local infection of the skin and subcutaneous tissue, unspecified: Secondary | ICD-10-CM | POA: Insufficient documentation

## 2020-01-27 DIAGNOSIS — I120 Hypertensive chronic kidney disease with stage 5 chronic kidney disease or end stage renal disease: Secondary | ICD-10-CM | POA: Diagnosis not present

## 2020-01-27 DIAGNOSIS — Z794 Long term (current) use of insulin: Secondary | ICD-10-CM | POA: Diagnosis not present

## 2020-01-27 DIAGNOSIS — E1122 Type 2 diabetes mellitus with diabetic chronic kidney disease: Secondary | ICD-10-CM | POA: Diagnosis not present

## 2020-01-27 DIAGNOSIS — N186 End stage renal disease: Secondary | ICD-10-CM | POA: Insufficient documentation

## 2020-01-27 LAB — COMPREHENSIVE METABOLIC PANEL
ALT: 22 U/L (ref 0–44)
AST: 27 U/L (ref 15–41)
Albumin: 3.2 g/dL — ABNORMAL LOW (ref 3.5–5.0)
Alkaline Phosphatase: 139 U/L — ABNORMAL HIGH (ref 38–126)
Anion gap: 14 (ref 5–15)
BUN: 44 mg/dL — ABNORMAL HIGH (ref 6–20)
CO2: 27 mmol/L (ref 22–32)
Calcium: 8.9 mg/dL (ref 8.9–10.3)
Chloride: 95 mmol/L — ABNORMAL LOW (ref 98–111)
Creatinine, Ser: 9.91 mg/dL — ABNORMAL HIGH (ref 0.61–1.24)
GFR calc Af Amer: 7 mL/min — ABNORMAL LOW (ref >60)
GFR calc non Af Amer: 6 mL/min — ABNORMAL LOW (ref >60)
Glucose, Bld: 93 mg/dL (ref 70–99)
Potassium: 4.8 mmol/L (ref 3.5–5.1)
Sodium: 136 mmol/L (ref 135–145)
Total Bilirubin: 1 mg/dL (ref 0.3–1.2)
Total Protein: 7 g/dL (ref 6.5–8.1)

## 2020-01-27 LAB — CBC WITH DIFFERENTIAL/PLATELET
Abs Immature Granulocytes: 0.07 10*3/uL (ref 0.00–0.07)
Basophils Absolute: 0.1 10*3/uL (ref 0.0–0.1)
Basophils Relative: 1 %
Eosinophils Absolute: 0.7 10*3/uL — ABNORMAL HIGH (ref 0.0–0.5)
Eosinophils Relative: 6 %
HCT: 49.4 % (ref 39.0–52.0)
Hemoglobin: 15.8 g/dL (ref 13.0–17.0)
Immature Granulocytes: 1 %
Lymphocytes Relative: 16 %
Lymphs Abs: 1.7 10*3/uL (ref 0.7–4.0)
MCH: 30.8 pg (ref 26.0–34.0)
MCHC: 32 g/dL (ref 30.0–36.0)
MCV: 96.3 fL (ref 80.0–100.0)
Monocytes Absolute: 1 10*3/uL (ref 0.1–1.0)
Monocytes Relative: 9 %
Neutro Abs: 7 10*3/uL (ref 1.7–7.7)
Neutrophils Relative %: 67 %
Platelets: 190 10*3/uL (ref 150–400)
RBC: 5.13 MIL/uL (ref 4.22–5.81)
RDW: 12.4 % (ref 11.5–15.5)
WBC: 10.5 10*3/uL (ref 4.0–10.5)
nRBC: 0 % (ref 0.0–0.2)

## 2020-01-27 LAB — CBG MONITORING, ED: Glucose-Capillary: 123 mg/dL — ABNORMAL HIGH (ref 70–99)

## 2020-01-27 LAB — LACTIC ACID, PLASMA: Lactic Acid, Venous: 1.9 mmol/L (ref 0.5–1.9)

## 2020-01-27 MED ORDER — OXYCODONE HCL 5 MG PO TABS
5.0000 mg | ORAL_TABLET | Freq: Once | ORAL | Status: AC
  Administered 2020-01-27: 5 mg via ORAL
  Filled 2020-01-27: qty 1

## 2020-01-27 MED ORDER — CIPROFLOXACIN HCL 500 MG PO TABS
500.0000 mg | ORAL_TABLET | Freq: Every day | ORAL | 0 refills | Status: DC
Start: 2020-01-27 — End: 2020-01-27

## 2020-01-27 MED ORDER — OXYCODONE HCL 5 MG PO CAPS: 5.0000 mg | ORAL_CAPSULE | Freq: Four times a day (QID) | ORAL | 0 refills | Status: DC | PRN

## 2020-01-27 MED ORDER — CIPROFLOXACIN HCL 500 MG PO TABS
500.0000 mg | ORAL_TABLET | Freq: Every day | ORAL | 0 refills | Status: DC
Start: 2020-01-27 — End: 2020-02-01

## 2020-01-27 MED ORDER — SODIUM CHLORIDE 0.9% FLUSH: 3.0000 mL | Freq: Once | INTRAVENOUS | Status: DC

## 2020-01-27 NOTE — Discharge Instructions (Addendum)
Take the antibiotics as prescribed.  Complete the entire course of antibiotics regardless of symptom improvement to prevent worsening or recurrence of your infection. You can take the pain medicine as needed to help with pain. Complete your dialysis sessions as scheduled. You will need to follow-up with your podiatrist for further evaluation. Return to the ER if you start to experience worsening pain or swelling near the area of the big toe, worsening pain, redness, swelling, fever.

## 2020-01-27 NOTE — ED Notes (Signed)
Patient verbalizes understanding of discharge instructions. Opportunity for questioning and answers were provided. Armband removed by staff, pt discharged from ED and ambulated to personal vehicle to go home.

## 2020-01-27 NOTE — ED Triage Notes (Addendum)
Pt eval/triage. Pt c/o wound to left foot for a couple weeks; states does not have appointment with wound doctor until Friday. Lateral foot wounds noted with malodorous discharge noted. Multiple wounds to BLE and hand as well from fall several weeks ago.  Hallie Wieters, PA in for eval and advised pt that he needs to go to ER stat for higher level care/tx to avoid possible complications 2/2 h/o DM and ESRD.  Pt requests that this RN call Arun Herrod, [phone] to advise her that pt needs to go to ER and request she NOT call him.

## 2020-01-27 NOTE — ED Notes (Signed)
Please call Dr. March Rummage per pt.

## 2020-01-27 NOTE — ED Triage Notes (Signed)
Pt sent from [facility].  Reports falling several weeks ago.  Wounds to bilateral lower legs and R hand.  Also reports L foot wound for a few weeks.     Denies fever and chills.

## 2020-01-27 NOTE — ED Notes (Signed)
Employee discovered that pt had illegal substances in room. Tom in security notified. Security notified [employer].

## 2020-01-27 NOTE — ED Notes (Signed)
Called [facility] ED and gave report to triage RN. Also attempted to call pt's mother, per pt request to inform her of pt going to ER and for her NOT to call him, that he will call her when he is able, but no answer on mother's phone.

## 2020-01-27 NOTE — ED Notes (Signed)
DSG applied to left foot.  Patient is being discharged from the [facility] and sent to the Emergency Department via POV by staff. Per Debara Pickett, PA, patient is stable but in need of higher level of care due to infection to left foot. Patient is aware and verbalizes understanding of plan of care.  Vitals:   01/27/20 1712  BP: 118/77  Pulse: 95  Resp: 18  Temp: 98.5 F (36.9 C)  SpO2: 99%

## 2020-01-27 NOTE — ED Provider Notes (Signed)
[facility] EMERGENCY DEPARTMENT Provider Note   CSN: [id number] Arrival date & time: 01/27/20  1727     History Chief Complaint  Patient presents with  . Wound Infection    [name] is a 39 y.o. 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  States that he was discharged from his wound care center approximately 2 months ago due to "everything was looking good."  He noticed 2 days ago that he had purulent drainage as well as a foul odor from his left lateral foot similar to his prior infections.  He tried to get an appointment with his podiatrist, Dr. March Rummage who did his prior surgeries but was unable to do so for another week.  He was told to come to the ER.  Patient denies any fevers but does report worsening pain with palpation.  He denies any missed dialysis sessions, vomiting, changes to range of motion of extremities.  He did have a fall 2 weeks ago where he had abrasions in bilateral knees.  He remains ambulatory.  HPI     Past Medical History:  Diagnosis Date  . Diabetes mellitus without complication ([place])   . Diabetic gastroparesis ([place])   . Dialysis patient ([place])   . Hypertension   . Renal disorder    Dialysis  . Sepsis [facility])     Patient Active Problem List   Diagnosis Date Noted  . Diarrhea 04/24/2019  . Bone erosion determined by x-ray   . Encephalopathy acute   . Diabetic ulcer of left midfoot associated with diabetes mellitus due to underlying condition, with necrosis of bone ([place])   . Open wound of left foot   . Encounter for orthopedic aftercare following surgical amputation   . Foot infection   . Gangrene of toe of left foot ([place])   . Cellulitis 10/26/2018  . Diabetic ulcer of left midfoot associated with diabetes mellitus due to underlying condition,  with bone involvement without evidence of necrosis ([place])   . Nausea vomiting and diarrhea 10/20/2018  . Septic arthritis of left foot ([place])   . DM (diabetes mellitus), secondary, uncontrolled, with neurologic complications ([place])   . Headache 09/23/2018  . Osteomyelitis of foot, left, acute ([place])   . Acute osteomyelitis involving ankle and foot, left ([place])   . Osteomyelitis of fifth toe of left foot ([place])   . Anemia of chronic disease 08/18/2018  . Encounter for immunization 06/29/2018  . Unspecified protein-calorie malnutrition ([place]) 02/14/2018  . Encounter for removal of sutures 02/07/2018  . Osteomyelitis ([place]) 02/01/2018  . Fluid overload, unspecified 01/22/2018  . Osteomyelitis of ankle or foot, acute, right ([place]) 10/30/2017  . Cellulitis and abscess of toe of left foot   . Diabetic foot infection ([place])   . Insulin dependent diabetes mellitus 10/25/2017  . Diabetic gastroparesis ([place]) 10/25/2017  . Sepsis ([place]) 10/25/2017  . Fever, unspecified 10/28/2016  . Iron deficiency anemia, unspecified 10/28/2016  . Other specified coagulation defects ([place]) 10/28/2016  . Pruritus, unspecified 10/28/2016  . Secondary hyperparathyroidism of renal origin ([place]) 10/28/2016  . Shortness of breath 10/28/2016  . HTN (hypertension) 08/09/2014  . ESRD on dialysis ([place]) 03/19/2014  . MSSA bacteremia 05/31/2013  . Metabolic acidosis 14/43/1540    Past Surgical History:  Procedure Laterality Date  . AMPUTATION Right 02/02/2018  Procedure: RIGHT FIFTH TOE AND METATARSAL AMPUTATION. Filetted toe flap metatarsal resection. Debridement Plantar Foot wound;  Surgeon: Evelina Bucy, DPM;  Location: [facility];  Service: Podiatry;  Laterality: Right;  . AMPUTATION Left 08/20/2018   Procedure: FIFTH METATARSAL BONE BIOPSY;  Surgeon: Evelina Bucy, DPM;  Location: [facility];  Service: Podiatry;  Laterality: Left;  . AMPUTATION Left 10/28/2018   Procedure: LEFT GREAT TOE AMPUTATION;  Surgeon: Evelina Bucy,  DPM;  Location: [facility];  Service: Podiatry;  Laterality: Left;  . APPLICATION OF WOUND VAC  02/02/2018   Procedure: APPLICATION OF WOUND VAC  Right Foot;  Surgeon: Evelina Bucy, DPM;  Location: [facility];  Service: Podiatry;;  . APPLICATION OF WOUND VAC Left 10/28/2018   Procedure: APPLICATION OF WOUND VAC LEFT TOE;  Surgeon: Evelina Bucy, DPM;  Location: [facility];  Service: Podiatry;  Laterality: Left;  . APPLICATION OF WOUND VAC Left 11/01/2018   Procedure: APPLICATION OF WOUND VAC;  Surgeon: Evelina Bucy, DPM;  Location: [facility];  Service: Podiatry;  Laterality: Left;  . AV FISTULA PLACEMENT     left arm.  . AV FISTULA PLACEMENT Right 12/22/2016   Procedure: INSERTION OF ARTERIOVENOUS (AV) GORE-TEX GRAFT ARM;  Surgeon: Elam Dutch, MD;  Location: [facility] OR;  Service: Vascular;  Laterality: Right;  . AV FISTULA PLACEMENT Left 05/26/2018   Procedure: INSERTION OF  ARTERIOVENOUS (AV) GORE-TEX GRAFT LEFT ARM;  Surgeon: Serafina Mitchell, MD;  Location: [place];  Service: Vascular;  Laterality: Left;  . EYE SURGERY    . I & D EXTREMITY Right 10/31/2017   Procedure: IRRIGATION AND DEBRIDEMENT RIGHT FOOT;  Surgeon: Evelina Bucy, DPM;  Location: [facility];  Service: Podiatry;  Laterality: Right;  . I & D EXTREMITY Left 08/20/2018   Procedure: IRRIGATION AND DEBRIDEMENT EXTREMITY WITH SECONDARY WOUND CLOSUREAND APPLICATION OF WOUND VAC LEFT FOOT;  Surgeon: Evelina Bucy, DPM;  Location: [facility];  Service: Podiatry;  Laterality: Left;  . I & D EXTREMITY Left 10/20/2018   Procedure: IRRIGATION AND DEBRIDEMENT LEFT FOOT  DEBRIDEMENT LATERAL FOOT WOUND;  Surgeon: Evelina Bucy, DPM;  Location: [facility];  Service: Podiatry;  Laterality: Left;  . I & D EXTREMITY Left 10/28/2018   Procedure: IRRIGATION AND DEBRIDEMENT LEFT TOE;  Surgeon: Evelina Bucy, DPM;  Location: [place];  Service: Podiatry;  Laterality: Left;  . INSERTION OF DIALYSIS CATHETER     Right subclavian  . IR AV DIALY SHUNT INTRO NEEDLE/INTRACATH  INITIAL W/PTA/IMG RIGHT Right 02/05/2018  . IR THROMBECTOMY AV FISTULA W/THROMBOLYSIS/PTA INC/SHUNT/IMG LEFT Left 08/24/2018  . IR THROMBECTOMY AV FISTULA W/THROMBOLYSIS/PTA INC/SHUNT/IMG LEFT Left 01/06/2019  . IR US GUIDE VASC ACCESS LEFT  08/24/2018  . IR US GUIDE VASC ACCESS RIGHT  02/05/2018  . IRRIGATION AND DEBRIDEMENT FOOT Right 10/23/2018   Procedure: Irrigation and Debridement to tendon, Left Foot;  Surgeon: Evelina Bucy, DPM;  Location: [facility];  Service: Podiatry;  Laterality: Right;  . IRRIGATION AND DEBRIDEMENT FOOT Left 11/01/2018   Procedure: IRRIGATION AND DEBRIDEMENT PARTIAL WOUND CLOSURE LOCAL TISSUE TRANSFER AND FLAP ROTATION, LEFT FOOT;  Surgeon: Evelina Bucy, DPM;  Location: [facility];  Service: Podiatry;  Laterality: Left;  . TRANSMETATARSAL AMPUTATION N/A 08/18/2018   Procedure: IRRIGATION AND DEBRIDEMENT OF LEFT 5TH TOE AND TRANSMETATARSAL, WITH PARTICAL LEFT 5TH TOE AND METATARSAL AMPUTATION, BONE BIOPSY, WOUND VAC APPLICATION.;  Surgeon: Evelina Bucy, DPM;  Location: [facility];  Service: Podiatry;  Laterality:  N/A;  . UPPER EXTREMITY VENOGRAPHY N/A 11/16/2016   Procedure: Upper Extremity Venography - Right Central;  Surgeon: Elam Dutch, MD;  Location: [facility] CV LAB;  Service: Cardiovascular;  Laterality: N/A;  . UPPER EXTREMITY VENOGRAPHY N/A 05/25/2018   Procedure: UPPER EXTREMITY VENOGRAPHY - Bilateral;  Surgeon: Marty Heck, MD;  Location: [facility] CV LAB;  Service: Cardiovascular;  Laterality: N/A;       Family History  Problem Relation Age of Onset  . Diabetes Mellitus II Other   . Diabetes Father   . Renal Disease Father        ESRD    Social History   Tobacco Use  . Smoking status: Never Smoker  . Smokeless tobacco: Never Used  Substance Use Topics  . Alcohol use: No  . Drug use: No    Home Medications Prior to Admission medications   Medication Sig Start Date End Date Taking? Authorizing Provider  Amino Acids-Protein Hydrolys  (PRO-STAT) LIQD Take by mouth. 08/25/18   [provider]  aspirin 325 MG tablet Take by mouth. 04/27/19   [provider]  calcitRIOL (ROCALTROL) 0.5 MCG capsule Take 5 capsules (2.5 mcg total) by mouth every Monday, Wednesday, and Friday with hemodialysis. 10/26/18   Hongalgi, Lenis Dickinson, MD  cinacalcet (SENSIPAR) 60 MG tablet Take by mouth. 08/16/19   [provider]  ciprofloxacin (CIPRO) 500 MG tablet Take 1 tablet (500 mg total) by mouth daily with breakfast for 7 days. 01/27/20 02/03/20  Delia Heady, PA-C  clonazePAM (KLONOPIN) 0.5 MG tablet Take by mouth. 10/24/18   [provider]  collagenase (SANTYL) ointment Apply topically. 10/24/18   [provider]  glucose blood (ACCU-CHEK AVIVA PLUS) test strip Accu-Chek Aviva Plus test strp 11/24/17   [provider]  heparin 1000 UNIT/ML injection Heparin Sodium (Porcine) 1,000 Units/mL Systemic 02/17/19 02/16/20  [provider]  insulin glargine (LANTUS) 100 UNIT/ML injection Inject into the skin. 10/24/18   [provider]  Lactobacillus (ACIDOPHILUS) CAPS capsule Take by mouth. 08/25/18   [provider]  losartan (COZAAR) 50 MG tablet Take by mouth. 08/25/18   [provider]  metoCLOPramide (REGLAN) 5 MG tablet Take by mouth. 08/25/18   [provider]  multivitamin (RENA-VIT) TABS tablet Take by mouth. 10/24/18   [provider]  NOVOLOG 100 UNIT/ML injection INJECT 6 TO 7 UNITS SUBCUTANEOUSLY TWICE DAILY WITH A MEAL 11/10/19   Kerin Perna, NP  ondansetron [facility]) 4 MG tablet Take by mouth. 04/28/19   [provider]  oxycodone (OXY-IR) 5 MG capsule Take 1 capsule (5 mg total) by mouth every 6 (six) hours as needed. 01/27/20   Madysun Thall, PA-C  promethazine (PHENERGAN) 25 MG tablet Take by mouth. 12/15/17   [provider]  sevelamer carbonate (RENVELA) 800 MG tablet Take by mouth. 04/27/19   [provider]    sildenafil (VIAGRA) 100 MG tablet Take by mouth. 03/16/18   [provider]  silver sulfADIAZINE (SILVADENE) 1 % cream Apply topically. 04/28/19   [provider]  sulfamethoxazole-trimethoprim (BACTRIM) 400-80 MG tablet Take 1 tablet by mouth 2 (two) times daily. 12/12/19   Landis Martins, DPM  traMADol (ULTRAM) 50 MG tablet Take by mouth. 01/11/19   [provider]  Vancomycin (VANCOCIN) 750-5 MG/150ML-% SOLN Inject 150 mLs (750 mg total) into the vein every Monday, Wednesday, and Friday with hemodialysis. 04/28/19   Earlene Plater, MD    Allergies    Patient has no  known allergies.  Review of Systems   Review of Systems  Constitutional: Negative for appetite change, chills and fever.  HENT: Negative for ear pain, rhinorrhea, sneezing and sore throat.   Eyes: Negative for photophobia and visual disturbance.  Respiratory: Negative for cough, chest tightness, shortness of breath and wheezing.   Cardiovascular: Negative for chest pain and palpitations.  Gastrointestinal: Negative for abdominal pain, blood in stool, constipation, diarrhea, nausea and vomiting.  Genitourinary: Negative for dysuria, hematuria and urgency.  Musculoskeletal: Negative for myalgias.  Skin: Positive for wound. Negative for rash.  Neurological: Negative for dizziness, weakness and light-headedness.    Physical Exam Updated Vital Signs BP 109/69 (BP Location: Left Arm)   Pulse 96   Temp 98.6 F (37 C) (Oral)   Resp 18   Ht 5\' 7"  (1.702 m)   Wt 70.9 kg   SpO2 98%   BMI 24.50 kg/m   Physical Exam Vitals and nursing note reviewed.  Constitutional:      General: He is not in acute distress.    Appearance: He is well-developed.  HENT:     Head: Normocephalic and atraumatic.     Nose: Nose normal.  Eyes:     General: No scleral icterus.       Left eye: No discharge.     Conjunctiva/sclera: Conjunctivae normal.  Cardiovascular:     Rate and Rhythm: Normal rate and regular  rhythm.     Heart sounds: Normal heart sounds. No murmur. No friction rub. No gallop.   Pulmonary:     Effort: Pulmonary effort is normal. No respiratory distress.     Breath sounds: Normal breath sounds.  Abdominal:     General: Bowel sounds are normal. There is no distension.     Palpations: Abdomen is soft.     Tenderness: There is no abdominal tenderness. There is no guarding.  Musculoskeletal:        General: Normal range of motion.     Cervical back: Normal range of motion and neck supple.  Skin:    General: Skin is warm and dry.     Findings: No rash.     Comments: Wounds noted on image of the left lateral foot.  There is some drainage noted.  Tenderness palpation surrounding the area.  Normal range of motion of remaining digits.  2+ DP pulse palpated bilaterally.  Ambulatory.  Neurological:     Mental Status: He is alert.     Motor: No abnormal muscle tone.     Coordination: Coordination normal.         ED Results / Procedures / Treatments   Labs (all labs ordered are listed, but only abnormal results are displayed) Labs Reviewed  COMPREHENSIVE METABOLIC PANEL - Abnormal; Notable for the following components:      Result Value   Chloride 95 (*)    BUN 44 (*)    Creatinine, Ser 9.91 (*)    Albumin 3.2 (*)    Alkaline Phosphatase 139 (*)    GFR calc non Af Amer 6 (*)    GFR calc Af Amer 7 (*)    All other components within normal limits  CBC WITH DIFFERENTIAL/PLATELET - Abnormal; Notable for the following components:   Eosinophils Absolute 0.7 (*)    All other components within normal limits  CBG MONITORING, ED - Abnormal; Notable for the following components:   Glucose-Capillary 123 (*)    All other components within normal limits  LACTIC ACID, PLASMA  LACTIC ACID,  PLASMA    EKG None  Radiology DG Foot Complete Left  Result Date: 01/27/2020 CLINICAL DATA:  Infection. EXAM: LEFT FOOT - COMPLETE 3+ VIEW COMPARISON:  April 24, 2019 FINDINGS: The patient is  status post prior amputation of the fifth digit. The patient is status post prior partial amputation of the first digit through the first metatarsal. There are age-indeterminate erosions through the remaining portions of the first metatarsal. There are advanced degenerative changes at the second metatarsophalangeal joint. Vascular calcifications are noted. There is soft tissue swelling about the foot. There is an ulcer at the lateral aspect of the foot at the level of the base of the remaining fifth metatarsal. There are no obvious erosions. There are degenerative changes of the midfoot. There are progressive erosions of the sesamoid bones of the first digit. IMPRESSION: 1. No acute displaced fracture or dislocation. 2. Progressive erosions involving the first metatarsal and sesamoid bones concerning for osteomyelitis. 3. Status post prior amputation of the fifth digit. 4. Ulcer involving the lateral aspect of the foot. 5. Nonspecific soft tissue swelling is noted. Electronically Signed   By: Constance Holster M.D.   On: 01/27/2020 21:50    Procedures Procedures (including critical care time)  Medications Ordered in ED Medications  sodium chloride flush (NS) 0.9 % injection 3 mL (has no administration in time range)  oxyCODONE (Oxy IR/ROXICODONE) immediate release tablet 5 mg (5 mg Oral Given 01/27/20 2120)    ED Course  I have reviewed the triage vital signs and the nursing notes.  Pertinent labs & imaging results that were available during my care of the patient were reviewed by me and considered in my medical decision making (see chart for details).    MDM Rules/Calculators/A&P                      39 year old male with a past medical history of diabetes, ESRD on dialysis, hypertension presenting to the ED with concerns for wound infection.  Patient was regularly seeing wound care clinic up until 2 months ago when he was discharged.  He started noticing 2 days ago that he had purulent drainage  as well as a foul odor from the left lateral foot similar to prior infections.  He tried to get an appointment with his podiatrist but was unable to do so for another week.  He was told to come to the ER.  He denies fevers.  He did have a fall last week but remains ambulatory.  On exam there is an ulcer and some clear drainage noted on the lateral aspect of the left foot.  No changes to range of motion of digits.  No pain or wounds noted on the area of the first MTP joint.  2+ DP pulses noted bilaterally.  No bleeding noted.  Patient is afebrile here without recent use of antipyretics.  Lactic acid level is normal.  Creatinine is 9.9 consistent with his history of ESRD.  Normal potassium.  No leukocytosis noted.  X-ray shows possible osteomyelitis of the first MTP joint which was present in prior x-rays.  This does not correlate clinically with the area of concern.  X-ray does show an ulcer involving the lateral aspect of the foot without evidence of osteomyelitis.  Patient is comfortable with outpatient antibiotic treatment.  I spoke to the pharmacist, Suezanne Jacquet who feels that Cipro 500 every 24 hours is appropriate.  From chart review I do not see any history of MRSA infection in the  past.  Will discharge home with pain medicine and have him follow-up with his podiatrist. Patient discussed with my attending, Dr. Wilson Singer.  All imaging, if done today, including plain films, CT scans, and ultrasounds, independently reviewed by me, and interpretations confirmed via formal radiology reads.  Patient is hemodynamically stable, in NAD, and able to ambulate in the ED. Evaluation does not show pathology that would require ongoing emergent intervention or inpatient treatment. I explained the diagnosis to the patient. Pain has been managed and has no complaints prior to discharge. Patient is comfortable with above plan and is stable for discharge at this time. All questions were answered prior to disposition. Strict return  precautions for returning to the ED were discussed. Encouraged follow up with PCP.   Prior to providing a prescription for a controlled substance, I independently reviewed the patient's recent prescription history on the [place]. The patient had no recent or regular prescriptions and was deemed appropriate for a brief, less than 3 day prescription of narcotic for acute analgesia.  An After Visit Summary was printed and given to the patient.   Portions of this note were generated with Lobbyist. Dictation errors may occur despite best attempts at proofreading.  Final Clinical Impression(s) / ED Diagnoses Final diagnoses:  Wound infection    Rx / DC Orders ED Discharge Orders         Ordered    oxycodone (OXY-IR) 5 MG capsule  Every 6 hours PRN     01/27/20 2221    ciprofloxacin (CIPRO) 500 MG tablet  Daily with breakfast,   Status:  Discontinued     01/27/20 2221    ciprofloxacin (CIPRO) 500 MG tablet  Daily with breakfast     01/27/20 2224           Delia Heady, PA-C 01/27/20 2225    Virgel Manifold, MD 01/28/20 1506

## 2020-01-29 ENCOUNTER — Telehealth: Payer: Self-pay | Admitting: *Deleted

## 2020-01-29 ENCOUNTER — Other Ambulatory Visit: Payer: Self-pay

## 2020-01-29 ENCOUNTER — Encounter (HOSPITAL_COMMUNITY): Payer: Self-pay

## 2020-01-29 ENCOUNTER — Other Ambulatory Visit (HOSPITAL_COMMUNITY): Payer: Self-pay | Admitting: Nephrology

## 2020-01-29 ENCOUNTER — Emergency Department (HOSPITAL_COMMUNITY)
Admission: EM | Admit: 2020-01-29 | Discharge: 2020-01-29 | Discharge status: Against Medical Advice | Payer: Medicare Other | Source: Home / Self Care

## 2020-01-29 ENCOUNTER — Ambulatory Visit (HOSPITAL_COMMUNITY)
Admission: RE | Admit: 2020-01-29 | Discharge: 2020-01-29 | Disposition: A | Discharge status: Home / Self Care | Payer: Medicare Other | Source: Ambulatory Visit | Attending: Nephrology | Admitting: Nephrology

## 2020-01-29 ENCOUNTER — Other Ambulatory Visit: Payer: Self-pay | Admitting: Radiology

## 2020-01-29 DIAGNOSIS — Z79899 Other long term (current) drug therapy: Secondary | ICD-10-CM | POA: Insufficient documentation

## 2020-01-29 DIAGNOSIS — Z992 Dependence on renal dialysis: Secondary | ICD-10-CM

## 2020-01-29 DIAGNOSIS — T8241XA Breakdown (mechanical) of vascular dialysis catheter, initial encounter: Secondary | ICD-10-CM | POA: Diagnosis not present

## 2020-01-29 DIAGNOSIS — N186 End stage renal disease: Secondary | ICD-10-CM

## 2020-01-29 DIAGNOSIS — Z89412 Acquired absence of left great toe: Secondary | ICD-10-CM | POA: Insufficient documentation

## 2020-01-29 DIAGNOSIS — Z794 Long term (current) use of insulin: Secondary | ICD-10-CM | POA: Insufficient documentation

## 2020-01-29 DIAGNOSIS — I130 Hypertensive heart and chronic kidney disease with heart failure and stage 1 through stage 4 chronic kidney disease, or unspecified chronic kidney disease: Secondary | ICD-10-CM | POA: Insufficient documentation

## 2020-01-29 DIAGNOSIS — Z833 Family history of diabetes mellitus: Secondary | ICD-10-CM | POA: Insufficient documentation

## 2020-01-29 DIAGNOSIS — E875 Hyperkalemia: Secondary | ICD-10-CM | POA: Diagnosis not present

## 2020-01-29 DIAGNOSIS — Z95828 Presence of other vascular implants and grafts: Secondary | ICD-10-CM | POA: Insufficient documentation

## 2020-01-29 DIAGNOSIS — Z89421 Acquired absence of other right toe(s): Secondary | ICD-10-CM | POA: Insufficient documentation

## 2020-01-29 DIAGNOSIS — E1122 Type 2 diabetes mellitus with diabetic chronic kidney disease: Secondary | ICD-10-CM | POA: Insufficient documentation

## 2020-01-29 DIAGNOSIS — Z841 Family history of disorders of kidney and ureter: Secondary | ICD-10-CM | POA: Insufficient documentation

## 2020-01-29 LAB — BASIC METABOLIC PANEL
Anion gap: 13 (ref 5–15)
BUN: 72 mg/dL — ABNORMAL HIGH (ref 6–20)
CO2: 21 mmol/L — ABNORMAL LOW (ref 22–32)
Calcium: 8.2 mg/dL — ABNORMAL LOW (ref 8.9–10.3)
Chloride: 98 mmol/L (ref 98–111)
Creatinine, Ser: 13 mg/dL — ABNORMAL HIGH (ref 0.61–1.24)
GFR calc Af Amer: 5 mL/min — ABNORMAL LOW (ref >60)
GFR calc non Af Amer: 4 mL/min — ABNORMAL LOW (ref >60)
Glucose, Bld: 163 mg/dL — ABNORMAL HIGH (ref 70–99)
Potassium: 7.1 mmol/L (ref 3.5–5.1)
Sodium: 132 mmol/L — ABNORMAL LOW (ref 135–145)

## 2020-01-29 LAB — GLUCOSE, CAPILLARY: Glucose-Capillary: 127 mg/dL — ABNORMAL HIGH (ref 70–99)

## 2020-01-29 MED ORDER — FENTANYL CITRATE (PF) 100 MCG/2ML IJ SOLN
INTRAMUSCULAR | Status: AC
  Filled 2020-01-29: qty 2

## 2020-01-29 MED ORDER — SODIUM CHLORIDE 0.9 % IV SOLN: INTRAVENOUS | Status: DC

## 2020-01-29 MED ORDER — MIDAZOLAM HCL 2 MG/2ML IJ SOLN
INTRAMUSCULAR | Status: AC
  Filled 2020-01-29: qty 2

## 2020-01-29 NOTE — Telephone Encounter (Signed)
[facility] states pt's left lateral foot by the heel has blistering and cloudy material beneath the skin as if something is going to come out, pt went the ED 01/27/2020 and was released and has an appt today at 11:00am with Short-Stay at [facility] to have dialysis port taken care of due to clot. I told [name] I would Secure Chat Dr. March Rummage for advice, but felt if pt went to [facility] and informed of the foot they may send him to the ED or have him admitted.

## 2020-01-29 NOTE — Progress Notes (Signed)
Pt drinking sprite. Talked to pt about getting temp cath today so he could get dialysed. Pt states he is not going to get the catheter and not stay here for 4 more hours while getting dialysed. Pt states he wants to go to the ED to get his foot looked at. Pt was taken to the ED by wheelchair. Awake and alert. In no distress

## 2020-01-29 NOTE — Telephone Encounter (Signed)
Pt called from [facility] ED and had his phone on speaker while he stood in front of the ED Registrar's desk and I explained that pt had an infection in his foot and our Dr. Hardie Pulley wanted him to be seen in the ED, and if he didn't go to the ED he needed to be seen in our office. I spoke with Vollie and told him to be treated in the [facility] ED and he stated he wasn't going to stay because they looked like they were not going to see him and he wanted an appt for tomorrow. I told pt that he should get the oral antibiotic he was prescribed on 01/27/2020 and take until he was evaluated 01/30/2020. I transferred pt to Scheduler and he is scheduled 01/30/2020 at 8:15am.

## 2020-01-29 NOTE — Telephone Encounter (Signed)
Noted thanks °

## 2020-01-29 NOTE — Telephone Encounter (Signed)
I spoke with [facility] states pt is Interventional Radiology and has been directed to go to the ED once his procedure is complete.

## 2020-01-29 NOTE — Progress Notes (Signed)
CRITICAL VALUE ALERT  Critical Value:  7.1 Potassium   Date & Time Notied:  01/29/2020 at [place]  Provider Notified: Dr. Vernard Gambles  Orders Received/Actions taken: MD notified, awaiting orders

## 2020-01-29 NOTE — Telephone Encounter (Signed)
I informed [name] Andgraves that pt had been directed to the ED after his procedure. [name] states she will text him because his phone is not working and have him go to the ED and if he does not he must call us 1st thing in the morning.

## 2020-01-29 NOTE — Telephone Encounter (Signed)
Dr. March Rummage states through [place]. have pt show left foot to Short-Stay staff or if they do not have him to the ED have pt seen by one of our doctors prior to Thursday. Left message for [place] and for pt at listed phone number.

## 2020-01-29 NOTE — Telephone Encounter (Signed)
I spoke with [facility] states pt is to be seen at [facility] [phone]. I spoke [place] and informed of pt's left foot wound status, she states she will speak with nurse and they may refer to the ED prior to any procedure and will contact me with updated information.

## 2020-01-29 NOTE — Telephone Encounter (Signed)
[place] asked what is going on with pt.

## 2020-01-29 NOTE — Progress Notes (Signed)
Patient presented to IR today as an add-on outpatient for clotted fistula. Also, of note, he does have an infection of the left foot currently being treated- last seen in the ED 5/1. His last contact with podiatry office was today.  It was recommended that he seek emergent evaluation based on finding of home health nurse.  Plan was made to proceed with declotting procedure today, then send to the ED for ongoing work-up of his foot infection.  All of the above was relayed to [place] and [place] at [facility].    Pre-procedure labs revealed a potassium of 7.1.  Due to severity and concern for safety with sedation medication, declotting procedure was deferred.  Contacted Nephrology and joint decision made to place temporary catheter for urgent dialysis through the ED.  When told of the change in plan patient was upset and refused to have catheter placed despite multiple attempts to encourage him to accept treatment for his hyperkalemia.  Patient refused and stated he had an appointment with [facility] tomorrow. He asked for a phone and the number for [facility] to confirm his appointment which was provided to him despite my urging for him to get treatment today.  I again encouraged him to notify [facility] of his critical potassium and expressed concern for a cardiac event if he did not get urgent dialysis.  Patient stated he wanted to go to the ED to get treatment for his foot.  Patient was taken to the ED by IR staff for treatment of his hyperkalemia as well as foot infection.    Brynda Greathouse, MS RD PA-C 5:35 PM

## 2020-01-29 NOTE — H&P (Signed)
Chief Complaint: Patient was seen in consultation today for left arm dialysis graft evaluation and possible intervention at the request of [place]  Referring Physician(s): [place]  Supervising Physician: Arne Cleveland  Patient Status: [facility] - Out-pt  History of Present Illness: ZADE FALKNER is a 39 y.o. male   ESRD Left arm dialysis graft placed 04/2018 Dr Trula Slade  Was doing well with graft Occasional  intervention at [facility] Last intervention "Months ago" Would have gone there today- but was unable to get in  Last use Friday-- completed dialysis Went to dialysis this am-- clotted  Last intervention in IR 12/2018: IMPRESSION: 1. Technically successful declot of left upper arm straight synthetic hemodialysis graft. ACCESS: Remains approachable for percutaneous intervention as needed.  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      Past Medical History:  Diagnosis Date  . Diabetes mellitus without complication ([place])   . Diabetic gastroparesis ([place])   . Dialysis patient ([place])   . Hypertension   . Renal disorder    Dialysis  . Sepsis [facility]     Past Surgical History:  Procedure Laterality Date  . AMPUTATION Right 02/02/2018   Procedure: RIGHT FIFTH TOE AND METATARSAL AMPUTATION. Filetted toe flap metatarsal resection. Debridement Plantar Foot wound;  Surgeon: Evelina Bucy, DPM;  Location: [place];  Service: Podiatry;  Laterality: Right;  . AMPUTATION Left 08/20/2018   Procedure: FIFTH METATARSAL BONE BIOPSY;  Surgeon: Evelina Bucy, DPM;  Location: [place];  Service: Podiatry;  Laterality: Left;  .  AMPUTATION Left 10/28/2018   Procedure: LEFT GREAT TOE AMPUTATION;  Surgeon: Evelina Bucy, DPM;  Location: [place];  Service: Podiatry;  Laterality: Left;  . APPLICATION OF WOUND VAC  02/02/2018   Procedure: APPLICATION OF WOUND VAC  Right Foot;  Surgeon: Evelina Bucy, DPM;  Location: [place];  Service: Podiatry;;  . APPLICATION OF WOUND VAC Left 10/28/2018   Procedure: APPLICATION OF WOUND VAC LEFT TOE;  Surgeon: Evelina Bucy, DPM;  Location: [place];  Service: Podiatry;  Laterality: Left;  . APPLICATION OF WOUND VAC Left 11/01/2018   Procedure: APPLICATION OF WOUND VAC;  Surgeon: Evelina Bucy, DPM;  Location: [place];  Service: Podiatry;  Laterality: Left;  . AV FISTULA PLACEMENT     left arm.  . AV FISTULA PLACEMENT Right 12/22/2016   Procedure: INSERTION OF ARTERIOVENOUS (AV) GORE-TEX GRAFT ARM;  Surgeon: Elam Dutch, MD;  [facility] OR;  Service: Vascular;  Laterality: Right;  . AV FISTULA PLACEMENT Left 05/26/2018   Procedure: INSERTION OF  ARTERIOVENOUS (AV) GORE-TEX GRAFT LEFT ARM;  Surgeon: Serafina Mitchell, MD;  Location: [place];  Service: Vascular;  Laterality: Left;  . EYE SURGERY    . I & D EXTREMITY Right 10/31/2017   Procedure: IRRIGATION AND DEBRIDEMENT RIGHT FOOT;  Surgeon: Evelina Bucy, DPM;  Location: [place];  Service: Podiatry;  Laterality: Right;  . I & D EXTREMITY Left 08/20/2018   Procedure: IRRIGATION AND DEBRIDEMENT EXTREMITY WITH SECONDARY WOUND CLOSUREAND APPLICATION OF WOUND VAC LEFT FOOT;  Surgeon: Evelina Bucy, DPM;  Location: [place];  Service:  Podiatry;  Laterality: Left;  . I & D EXTREMITY Left 10/20/2018   Procedure: IRRIGATION AND DEBRIDEMENT LEFT FOOT  DEBRIDEMENT LATERAL FOOT WOUND;  Surgeon: Evelina Bucy, DPM;  Location: [place];  Service: Podiatry;  Laterality: Left;  . I & D EXTREMITY Left 10/28/2018   Procedure: IRRIGATION AND DEBRIDEMENT LEFT TOE;  Surgeon: Evelina Bucy, DPM;  Location: [place];  Service: Podiatry;  Laterality: Left;  .  INSERTION OF DIALYSIS CATHETER     Right subclavian  . IR AV DIALY SHUNT INTRO NEEDLE/INTRACATH INITIAL W/PTA/IMG RIGHT Right 02/05/2018  . IR THROMBECTOMY AV FISTULA W/THROMBOLYSIS/PTA INC/SHUNT/IMG LEFT Left 08/24/2018  . IR THROMBECTOMY AV FISTULA W/THROMBOLYSIS/PTA INC/SHUNT/IMG LEFT Left 01/06/2019  . IR US GUIDE VASC ACCESS LEFT  08/24/2018  . IR US GUIDE VASC ACCESS RIGHT  02/05/2018  . IRRIGATION AND DEBRIDEMENT FOOT Right 10/23/2018   Procedure: Irrigation and Debridement to tendon, Left Foot;  Surgeon: Evelina Bucy, DPM;  Location: [place];  Service: Podiatry;  Laterality: Right;  . IRRIGATION AND DEBRIDEMENT FOOT Left 11/01/2018   Procedure: IRRIGATION AND DEBRIDEMENT PARTIAL WOUND CLOSURE LOCAL TISSUE TRANSFER AND FLAP ROTATION, LEFT FOOT;  Surgeon: Evelina Bucy, DPM;  Location: [place];  Service: Podiatry;  Laterality: Left;  . TRANSMETATARSAL AMPUTATION N/A 08/18/2018   Procedure: IRRIGATION AND DEBRIDEMENT OF LEFT 5TH TOE AND TRANSMETATARSAL, WITH PARTICAL LEFT 5TH TOE AND METATARSAL AMPUTATION, BONE BIOPSY, WOUND VAC APPLICATION.;  Surgeon: Evelina Bucy, DPM;  Location: [place];  Service: Podiatry;  Laterality: N/A;  . UPPER EXTREMITY VENOGRAPHY N/A 11/16/2016   Procedure: Upper Extremity Venography - Right Central;  Surgeon: Elam Dutch, MD;  Location: [place] CV [facility];  Service: Cardiovascular;  Laterality: N/A;  . UPPER EXTREMITY VENOGRAPHY N/A 05/25/2018   Procedure: UPPER EXTREMITY VENOGRAPHY - Bilateral;  Surgeon: Marty Heck, MD;  Location: [place] [facility];  Service: Cardiovascular;  Laterality: N/A;    Allergies: Patient has no known allergies.  Medications: Prior to Admission medications   Medication Sig Start Date End Date Taking? Authorizing Provider  Amino Acids-Protein Hydrolys (PRO-STAT) LIQD Take by mouth. 08/25/18   [provider]  aspirin 325 MG tablet Take by mouth. 04/27/19   [provider]  calcitRIOL (ROCALTROL) 0.5  MCG capsule Take 5 capsules (2.5 mcg total) by mouth every Monday, Wednesday, and Friday with hemodialysis. 10/26/18   Hongalgi, Lenis Dickinson, MD  cinacalcet (SENSIPAR) 60 MG tablet Take by mouth. 08/16/19   [provider]  ciprofloxacin (CIPRO) 500 MG tablet Take 1 tablet (500 mg total) by mouth daily with breakfast for 7 days. 01/27/20 02/03/20  Delia Heady, PA-C  clonazePAM (KLONOPIN) 0.5 MG tablet Take by mouth. 10/24/18   [provider]  collagenase (SANTYL) ointment Apply topically. 10/24/18   [provider]  glucose blood (ACCU-CHEK AVIVA PLUS) test strip Accu-Chek Aviva Plus test strp 11/24/17   [provider]  heparin 1000 UNIT/ML injection Heparin Sodium (Porcine) 1,000 Units/mL Systemic 02/17/19 02/16/20  [provider]  insulin glargine (LANTUS) 100 UNIT/ML injection Inject into the skin. 10/24/18   [provider]  Lactobacillus (ACIDOPHILUS) CAPS capsule Take by mouth. 08/25/18   [provider]  losartan (COZAAR) 50 MG tablet Take by mouth. 08/25/18   [provider]  metoCLOPramide (REGLAN) 5 MG tablet Take by mouth. 08/25/18   [provider]  multivitamin (RENA-VIT) TABS tablet Take by mouth. 10/24/18   [provider]  NOVOLOG 100 UNIT/ML injection INJECT 6  TO 7 UNITS SUBCUTANEOUSLY TWICE DAILY WITH A MEAL 11/10/19   Kerin Perna, NP  ondansetron [facility]) 4 MG tablet Take by mouth. 04/28/19   [provider]  oxycodone (OXY-IR) 5 MG capsule Take 1 capsule (5 mg total) by mouth every 6 (six) hours as needed. 01/27/20   Khatri, Hina, PA-C  promethazine (PHENERGAN) 25 MG tablet Take by mouth. 12/15/17   [provider]  sevelamer carbonate (RENVELA) 800 MG tablet Take by mouth. 04/27/19   [provider]  sildenafil (VIAGRA) 100 MG tablet Take by mouth. 03/16/18   [provider]  silver sulfADIAZINE (SILVADENE) 1 % cream Apply topically. 04/28/19   [provider]  sulfamethoxazole-trimethoprim (BACTRIM) 400-80 MG tablet Take 1 tablet by mouth 2 (two) times daily. 12/12/19   Landis Martins, DPM  traMADol (ULTRAM) 50 MG tablet Take by mouth. 01/11/19   [provider]  Vancomycin (VANCOCIN) 750-5 MG/150ML-% SOLN Inject 150 mLs (750 mg total) into the vein every Monday, Wednesday, and Friday with hemodialysis. 04/28/19   Earlene Plater, MD     Family History  Problem Relation Age of Onset  . Diabetes Mellitus II Other   . Diabetes Father   . Renal Disease Father        ESRD    Social History   Socioeconomic History  . Marital status: Single    Spouse name: Not on file  . Number of children: Not on file  . Years of education: Not on file  . Highest education level: Not on file  Occupational History  . Not on file  Tobacco Use  . Smoking status: Never Smoker  . Smokeless tobacco: Never Used  Substance and Sexual Activity  . Alcohol use: No  . Drug use: No  . Sexual activity: Never  Other Topics Concern  . Not on file  Social History Narrative  . Not on file   Social Determinants of Health   Financial Resource Strain:   . Difficulty of Paying Living Expenses:   Food Insecurity:   . Worried About Charity fundraiser in the Last Year:   . Arboriculturist in the Last Year:   Transportation Needs:   . Film/video editor (Medical):   Marland Kitchen Lack of Transportation (Non-Medical):   Physical Activity:   . Days of Exercise per Week:   . Minutes of Exercise per Session:   Stress:   . Feeling of Stress :   Social Connections:   . Frequency of Communication with Friends and Family:   . Frequency of Social Gatherings with Friends and Family:   . Attends Religious Services:   . Active Member of Clubs or Organizations:   . Attends Archivist Meetings:   Marland Kitchen Marital Status:     Review of Systems: A 12 point ROS discussed and pertinent positives are indicated in the HPI above.  All other systems are negative.  Review  of Systems  Constitutional: Positive for activity change. Negative for fever.  Respiratory: Negative for cough and shortness of breath.   Cardiovascular: Negative for chest pain.  Gastrointestinal: Negative for abdominal pain.  Musculoskeletal: Positive for gait problem.  Neurological: Negative for weakness.  Psychiatric/Behavioral: Negative for behavioral problems and confusion.    Vital Signs: BP (!) 171/107   Pulse 74   Temp 97.9 F (36.6 C) (Skin)   Resp 16   Ht 5\' 7"  (1.702 m)   Wt 160 lb (72.6 kg)   SpO2 100%  BMI 25.06 kg/m   Physical Exam Vitals reviewed.  Cardiovascular:     Rate and Rhythm: Normal rate and regular rhythm.     Heart sounds: Normal heart sounds.  Pulmonary:     Effort: Pulmonary effort is normal.     Breath sounds: Normal breath sounds.  Abdominal:     Palpations: Abdomen is soft.  Musculoskeletal:        General: Normal range of motion.     Comments: Left foot is wrapped in gauze dressing I did not unwrap foot To be evaluated in ED after IR procedure  Skin:    General: Skin is warm and dry.  Neurological:     Mental Status: He is alert and oriented to person, place, and time.  Psychiatric:        Mood and Affect: Mood normal.        Behavior: Behavior normal.        Thought Content: Thought content normal.        Judgment: Judgment normal.     Imaging: DG Foot Complete Left  Result Date: 01/27/2020 CLINICAL DATA:  Infection. EXAM: LEFT FOOT - COMPLETE 3+ VIEW COMPARISON:  April 24, 2019 FINDINGS: The patient is status post prior amputation of the fifth digit. The patient is status post prior partial amputation of the first digit through the first metatarsal. There are age-indeterminate erosions through the remaining portions of the first metatarsal. There are advanced degenerative changes at the second metatarsophalangeal joint. Vascular calcifications are noted. There is soft tissue swelling about the foot. There is an ulcer at the lateral  aspect of the foot at the level of the base of the remaining fifth metatarsal. There are no obvious erosions. There are degenerative changes of the midfoot. There are progressive erosions of the sesamoid bones of the first digit. IMPRESSION: 1. No acute displaced fracture or dislocation. 2. Progressive erosions involving the first metatarsal and sesamoid bones concerning for osteomyelitis. 3. Status post prior amputation of the fifth digit. 4. Ulcer involving the lateral aspect of the foot. 5. Nonspecific soft tissue swelling is noted. Electronically Signed   By: Constance Holster M.D.   On: 01/27/2020 21:50    Labs:  CBC: Recent Labs    04/24/19 0726 04/24/19 0726 04/24/19 0814 04/25/19 0842 04/26/19 0551 01/27/20 1743  WBC 12.7*  --   --  11.9* 7.7 10.5  HGB 17.5*   < > 19.0* 15.9 14.9 15.8  HCT 52.1*   < > 56.0* 49.9 45.5 49.4  PLT 135*  --   --  159 145* 190   < > = values in this interval not displayed.    COAGS: Recent Labs    04/24/19 0819  INR 1.2  APTT 40*    BMP: Recent Labs    04/24/19 0726 04/24/19 0726 04/24/19 0814 04/25/19 0842 04/26/19 0551 01/27/20 1743  NA 129*   < > 128* 129* 130* 136  K 4.2   < > 4.1 5.2* 4.6 4.8  CL 86*  --   --  90* 93* 95*  CO2 21*  --   --  18* 20* 27  GLUCOSE 131*  --   --  213* 136* 93  BUN 60*  --   --  73* 77* 44*  CALCIUM 9.5  --   --  8.9 8.6* 8.9  CREATININE 13.86*  --   --  15.60* 15.56* 9.91*  GFRNONAA 4*  --   --  3* 3* 6*  GFRAA  5*  --   --  4* 4* 7*   < > = values in this interval not displayed.    LIVER FUNCTION TESTS: Recent Labs    04/24/19 0726 01/27/20 1743  BILITOT 1.8* 1.0  AST 19 27  ALT 13 22  ALKPHOS 182* 139*  PROT 7.7 7.0  ALBUMIN 3.3* 3.2*    TUMOR MARKERS: No results for input(s): AFPTM, CEA, CA199, CHROMGRNA in the last 8760 hours.  Assessment and Plan:  ESRD Left arm dialysis graft clotted Completed dialysis Friday 01/26/20 This am -- clotted Now scheduled for graft evaluation  with possible angioplasty/stent/thrombolysis Possible tunneled dialysis catheter placement Risks and benefits discussed with the patient including, but not limited to bleeding, infection, vascular injury, pulmonary embolism, need for tunneled HD catheter placement or even death.  All of the patient's questions were answered, patient is agreeable to proceed. Consent signed and in chart.  Pt is aware- he will need to be evaluated in ED regarding his left foot pain. He was seen in ED 01/27/20 and has appt with Dr Norton Blizzard as OP: [facility] office May 7,2021   Thank you for this interesting consult.  I greatly enjoyed meeting LETRELL ATTWOOD and look forward to participating in their care.  A copy of this report was sent to the requesting provider on this date.  Electronically Signed: Lavonia Drafts, PA-C 01/29/2020, 1:12 PM   I spent a total of  30 Minutes   in face to face in clinical consultation, greater than 50% of which was counseling/coordinating care for left arm dialysis graft evaluation/intervention

## 2020-01-30 ENCOUNTER — Encounter (HOSPITAL_COMMUNITY): Payer: Self-pay | Admitting: Emergency Medicine

## 2020-01-30 ENCOUNTER — Telehealth: Payer: Self-pay | Admitting: *Deleted

## 2020-01-30 ENCOUNTER — Other Ambulatory Visit: Payer: Self-pay

## 2020-01-30 ENCOUNTER — Encounter: Payer: Self-pay | Admitting: Sports Medicine

## 2020-01-30 ENCOUNTER — Inpatient Hospital Stay (HOSPITAL_COMMUNITY)
Admission: EM | Admit: 2020-01-30 | Discharge: 2020-02-01 | DRG: 698 | Disposition: A | Discharge status: Home / Self Care | Payer: Medicare Other | Attending: Family Medicine | Admitting: Family Medicine

## 2020-01-30 ENCOUNTER — Ambulatory Visit (INDEPENDENT_AMBULATORY_CARE_PROVIDER_SITE_OTHER): Payer: Medicare Other | Admitting: Sports Medicine

## 2020-01-30 VITALS — Temp 97.7°F

## 2020-01-30 DIAGNOSIS — Z841 Family history of disorders of kidney and ureter: Secondary | ICD-10-CM

## 2020-01-30 DIAGNOSIS — L03116 Cellulitis of left lower limb: Secondary | ICD-10-CM | POA: Diagnosis present

## 2020-01-30 DIAGNOSIS — Z8739 Personal history of other diseases of the musculoskeletal system and connective tissue: Secondary | ICD-10-CM

## 2020-01-30 DIAGNOSIS — L97429 Non-pressure chronic ulcer of left heel and midfoot with unspecified severity: Secondary | ICD-10-CM | POA: Diagnosis present

## 2020-01-30 DIAGNOSIS — Z5320 Procedure and treatment not carried out because of patient's decision for unspecified reasons: Secondary | ICD-10-CM | POA: Diagnosis present

## 2020-01-30 DIAGNOSIS — E1142 Type 2 diabetes mellitus with diabetic polyneuropathy: Secondary | ICD-10-CM

## 2020-01-30 DIAGNOSIS — N186 End stage renal disease: Secondary | ICD-10-CM

## 2020-01-30 DIAGNOSIS — L0390 Cellulitis, unspecified: Secondary | ICD-10-CM | POA: Diagnosis present

## 2020-01-30 DIAGNOSIS — K3184 Gastroparesis: Secondary | ICD-10-CM | POA: Diagnosis present

## 2020-01-30 DIAGNOSIS — Z899 Acquired absence of limb, unspecified: Secondary | ICD-10-CM

## 2020-01-30 DIAGNOSIS — Z79899 Other long term (current) drug therapy: Secondary | ICD-10-CM

## 2020-01-30 DIAGNOSIS — Z833 Family history of diabetes mellitus: Secondary | ICD-10-CM

## 2020-01-30 DIAGNOSIS — Y832 Surgical operation with anastomosis, bypass or graft as the cause of abnormal reaction of the patient, or of later complication, without mention of misadventure at the time of the procedure: Secondary | ICD-10-CM | POA: Diagnosis present

## 2020-01-30 DIAGNOSIS — Z7982 Long term (current) use of aspirin: Secondary | ICD-10-CM

## 2020-01-30 DIAGNOSIS — Z89412 Acquired absence of left great toe: Secondary | ICD-10-CM

## 2020-01-30 DIAGNOSIS — E1043 Type 1 diabetes mellitus with diabetic autonomic (poly)neuropathy: Secondary | ICD-10-CM | POA: Diagnosis present

## 2020-01-30 DIAGNOSIS — L97521 Non-pressure chronic ulcer of other part of left foot limited to breakdown of skin: Secondary | ICD-10-CM | POA: Diagnosis not present

## 2020-01-30 DIAGNOSIS — Z20822 Contact with and (suspected) exposure to covid-19: Secondary | ICD-10-CM | POA: Diagnosis present

## 2020-01-30 DIAGNOSIS — W19XXXA Unspecified fall, initial encounter: Secondary | ICD-10-CM | POA: Diagnosis present

## 2020-01-30 DIAGNOSIS — S90822A Blister (nonthermal), left foot, initial encounter: Secondary | ICD-10-CM

## 2020-01-30 DIAGNOSIS — D631 Anemia in chronic kidney disease: Secondary | ICD-10-CM | POA: Diagnosis present

## 2020-01-30 DIAGNOSIS — T8241XA Breakdown (mechanical) of vascular dialysis catheter, initial encounter: Principal | ICD-10-CM | POA: Diagnosis present

## 2020-01-30 DIAGNOSIS — T82868A Thrombosis of vascular prosthetic devices, implants and grafts, initial encounter: Secondary | ICD-10-CM

## 2020-01-30 DIAGNOSIS — E875 Hyperkalemia: Secondary | ICD-10-CM

## 2020-01-30 DIAGNOSIS — L97529 Non-pressure chronic ulcer of other part of left foot with unspecified severity: Secondary | ICD-10-CM | POA: Diagnosis present

## 2020-01-30 DIAGNOSIS — Z89421 Acquired absence of other right toe(s): Secondary | ICD-10-CM

## 2020-01-30 DIAGNOSIS — Z794 Long term (current) use of insulin: Secondary | ICD-10-CM

## 2020-01-30 DIAGNOSIS — L089 Local infection of the skin and subcutaneous tissue, unspecified: Secondary | ICD-10-CM

## 2020-01-30 DIAGNOSIS — Z992 Dependence on renal dialysis: Secondary | ICD-10-CM

## 2020-01-30 DIAGNOSIS — M869 Osteomyelitis, unspecified: Secondary | ICD-10-CM

## 2020-01-30 DIAGNOSIS — E1022 Type 1 diabetes mellitus with diabetic chronic kidney disease: Secondary | ICD-10-CM | POA: Diagnosis present

## 2020-01-30 DIAGNOSIS — E10621 Type 1 diabetes mellitus with foot ulcer: Secondary | ICD-10-CM | POA: Diagnosis present

## 2020-01-30 LAB — COMPREHENSIVE METABOLIC PANEL
ALT: 16 U/L (ref 0–44)
AST: 16 U/L (ref 15–41)
Albumin: 3.3 g/dL — ABNORMAL LOW (ref 3.5–5.0)
Alkaline Phosphatase: 140 U/L — ABNORMAL HIGH (ref 38–126)
Anion gap: 15 (ref 5–15)
BUN: 89 mg/dL — ABNORMAL HIGH (ref 6–20)
CO2: 23 mmol/L (ref 22–32)
Calcium: 8.2 mg/dL — ABNORMAL LOW (ref 8.9–10.3)
Chloride: 95 mmol/L — ABNORMAL LOW (ref 98–111)
Creatinine, Ser: 14.87 mg/dL — ABNORMAL HIGH (ref 0.61–1.24)
GFR calc Af Amer: 4 mL/min — ABNORMAL LOW (ref >60)
GFR calc non Af Amer: 4 mL/min — ABNORMAL LOW (ref >60)
Glucose, Bld: 206 mg/dL — ABNORMAL HIGH (ref 70–99)
Potassium: 6.7 mmol/L (ref 3.5–5.1)
Sodium: 133 mmol/L — ABNORMAL LOW (ref 135–145)
Total Bilirubin: 0.9 mg/dL (ref 0.3–1.2)
Total Protein: 6.7 g/dL (ref 6.5–8.1)

## 2020-01-30 LAB — CBC WITH DIFFERENTIAL/PLATELET
Abs Immature Granulocytes: 0.02 10*3/uL (ref 0.00–0.07)
Basophils Absolute: 0.1 10*3/uL (ref 0.0–0.1)
Basophils Relative: 1 %
Eosinophils Absolute: 0.5 10*3/uL (ref 0.0–0.5)
Eosinophils Relative: 5 %
HCT: 46.4 % (ref 39.0–52.0)
Hemoglobin: 14.9 g/dL (ref 13.0–17.0)
Immature Granulocytes: 0 %
Lymphocytes Relative: 10 %
Lymphs Abs: 1.1 10*3/uL (ref 0.7–4.0)
MCH: 30.9 pg (ref 26.0–34.0)
MCHC: 32.1 g/dL (ref 30.0–36.0)
MCV: 96.3 fL (ref 80.0–100.0)
Monocytes Absolute: 0.6 10*3/uL (ref 0.1–1.0)
Monocytes Relative: 6 %
Neutro Abs: 8.6 10*3/uL — ABNORMAL HIGH (ref 1.7–7.7)
Neutrophils Relative %: 78 %
Platelets: 252 10*3/uL (ref 150–400)
RBC: 4.82 MIL/uL (ref 4.22–5.81)
RDW: 12.7 % (ref 11.5–15.5)
WBC: 10.9 10*3/uL — ABNORMAL HIGH (ref 4.0–10.5)
nRBC: 0 % (ref 0.0–0.2)

## 2020-01-30 LAB — CBG MONITORING, ED: Glucose-Capillary: 180 mg/dL — ABNORMAL HIGH (ref 70–99)

## 2020-01-30 MED ORDER — ALBUTEROL SULFATE HFA 108 (90 BASE) MCG/ACT IN AERS
2.0000 | INHALATION_SPRAY | Freq: Once | RESPIRATORY_TRACT | Status: AC
  Administered 2020-01-30: 2 via RESPIRATORY_TRACT
  Filled 2020-01-30: qty 6.7

## 2020-01-30 MED ORDER — SODIUM ZIRCONIUM CYCLOSILICATE 10 G PO PACK
10.0000 g | PACK | Freq: Once | ORAL | Status: AC
  Administered 2020-01-31: 10 g via ORAL
  Filled 2020-01-30: qty 1

## 2020-01-30 MED ORDER — VANCOMYCIN HCL 1500 MG/300ML IV SOLN
1500.0000 mg | Freq: Once | INTRAVENOUS | Status: AC
  Administered 2020-01-31: 1500 mg via INTRAVENOUS
  Filled 2020-01-30: qty 300

## 2020-01-30 MED ORDER — HYDROMORPHONE HCL 1 MG/ML IJ SOLN
1.0000 mg | Freq: Once | INTRAMUSCULAR | Status: AC
  Administered 2020-01-30: 1 mg via INTRAVENOUS
  Filled 2020-01-30: qty 1

## 2020-01-30 MED ORDER — PIPERACILLIN-TAZOBACTAM 3.375 G IVPB 30 MIN
3.3750 g | Freq: Once | INTRAVENOUS | Status: AC
  Administered 2020-01-30: 3.375 g via INTRAVENOUS
  Filled 2020-01-30: qty 50

## 2020-01-30 MED ORDER — INSULIN ASPART 100 UNIT/ML IV SOLN
5.0000 [IU] | Freq: Once | INTRAVENOUS | Status: AC
  Administered 2020-01-30: 5 [IU] via INTRAVENOUS

## 2020-01-30 MED ORDER — CALCIUM GLUCONATE-NACL 1-0.675 GM/50ML-% IV SOLN
1.0000 g | Freq: Once | INTRAVENOUS | Status: AC
  Administered 2020-01-30: 1000 mg via INTRAVENOUS
  Filled 2020-01-30: qty 50

## 2020-01-30 MED ORDER — DEXTROSE 50 % IV SOLN
1.0000 | Freq: Once | INTRAVENOUS | Status: AC
  Administered 2020-01-30: 50 mL via INTRAVENOUS
  Filled 2020-01-30: qty 50

## 2020-01-30 MED ORDER — VANCOMYCIN HCL IN DEXTROSE 750-5 MG/150ML-% IV SOLN
750.0000 mg | INTRAVENOUS | Status: DC
  Filled 2020-01-30: qty 150

## 2020-01-30 NOTE — Progress Notes (Signed)
Subjective: [name] is a 39 y.o. male patient seen in office for evaluation of ulceration of the left foot that started to worsen last week and was leaking and draining, went to ER on 5/1 and they did xrays and gave him antibiotics but he has not been able to take them and wants to see if he can get them at Dialysis. Denies nausea/fever/vomiting/chills/night sweats/shortness of breath but does admits some pain to left foot. Patient has no other pedal complaints at this time.  Patient Active Problem List   Diagnosis Date Noted  . Allergy, unspecified, initial encounter 07/17/2019  . Diarrhea 04/24/2019  . Bone erosion determined by x-ray   . Encephalopathy acute   . 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 Open wound of left foot   . Encounter for orthopedic aftercare following surgical amputation   . Foot infection   . Gangrene of toe of left foot ([place])   . Cellulitis 10/26/2018  . Diabetic ulcer of left midfoot associated with diabetes mellitus due to underlying condition, with bone involvement without evidence of necrosis ([place])   . Nausea vomiting and diarrhea 10/20/2018  . Septic arthritis of left foot ([place])   . DM (diabetes mellitus), secondary, uncontrolled, with neurologic complications ([place])   . Headache 09/23/2018  . Osteomyelitis of foot, left, acute ([place])   . Acute osteomyelitis involving ankle and foot, left ([place])   . Osteomyelitis of fifth toe of left foot ([place])   . Anemia of chronic disease 08/18/2018  . Encounter for immunization 06/29/2018  . Unspecified protein-calorie malnutrition ([place]) 02/14/2018  . Encounter for removal of sutures 02/07/2018  . Osteomyelitis ([place]) 02/01/2018  . Fluid overload, unspecified 01/22/2018  . Osteomyelitis of ankle or foot, acute, right ([place]) 10/30/2017  . Cellulitis and abscess of toe of left foot   . Diabetic foot infection ([place])   . Insulin  dependent diabetes mellitus 10/25/2017  . Diabetic gastroparesis ([place]) 10/25/2017  . Sepsis ([place]) 10/25/2017  . Fever, unspecified 10/28/2016  . Iron deficiency anemia, unspecified 10/28/2016  . 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 Pruritus, unspecified 10/28/2016  . Secondary hyperparathyroidism of renal origin ([place]) 10/28/2016  . Shortness of breath 10/28/2016  . HTN (hypertension) 08/09/2014  . ESRD on dialysis ([place]) 03/19/2014  . MSSA bacteremia 05/31/2013  . Metabolic acidosis 51/76/1607   Current Outpatient Medications on File Prior to Visit  Medication Sig Dispense Refill  . Amino Acids-Protein Hydrolys (PRO-STAT) LIQD Take by mouth.    Marland Kitchen aspirin 325 MG tablet Take by mouth.    . calcitRIOL (ROCALTROL) 0.5 MCG capsule Take 5 capsules (2.5 mcg total) by mouth every Monday, Wednesday, and Friday with hemodialysis. 30 capsule 0  . cinacalcet (SENSIPAR) 60 MG tablet Take by mouth.    . ciprofloxacin (CIPRO) 500 MG tablet Take 1 tablet (500 mg total) by mouth daily with breakfast for 7 days. 7 tablet 0  . clonazePAM (KLONOPIN) 0.5 MG tablet Take by mouth.    . collagenase (SANTYL) ointment Apply topically.    Marland Kitchen glucose blood (ACCU-CHEK AVIVA PLUS) test strip Accu-Chek Aviva Plus test strp    . heparin 1000 UNIT/ML injection Heparin Sodium (Porcine) 1,000 Units/mL Systemic    . insulin glargine (LANTUS) 100 UNIT/ML injection Inject into the skin.    . Lactobacillus (ACIDOPHILUS) CAPS capsule Take by mouth.    . losartan (COZAAR) 50 MG tablet Take by mouth.    Marland Kitchen  metoCLOPramide (REGLAN) 5 MG tablet Take by mouth.    . multivitamin (RENA-VIT) TABS tablet Take by mouth.    Marland Kitchen NOVOLOG 100 UNIT/ML injection INJECT 6 TO 7 UNITS SUBCUTANEOUSLY TWICE DAILY WITH A MEAL 10 mL 0  . ondansetron (ZOFRAN) 4 MG tablet Take by mouth.    Marland Kitchen oxycodone (OXY-IR) 5 MG capsule Take 1 capsule (5 mg total) by mouth every 6 (six) hours as needed. 5 capsule 0  . promethazine (PHENERGAN) 25 MG  tablet Take by mouth.    . sevelamer carbonate (RENVELA) 800 MG tablet Take by mouth.    . sildenafil (VIAGRA) 100 MG tablet Take by mouth.    . silver sulfADIAZINE (SILVADENE) 1 % cream Apply topically.    . sulfamethoxazole-trimethoprim (BACTRIM) 400-80 MG tablet Take 1 tablet by mouth 2 (two) times daily. 28 tablet 0  . traMADol (ULTRAM) 50 MG tablet Take by mouth.    . Vancomycin (VANCOCIN) 750-5 MG/150ML-% SOLN Inject 150 mLs (750 mg total) into the vein every Monday, Wednesday, and Friday with hemodialysis. 4000 mL 0   No current facility-administered medications on file prior to visit.   No Known Allergies  Recent Results (from the past 2160 hour(s))  House Account Tracking only     Status: None   Collection Time: 12/12/19 11:00 AM  Result Value Ref Range   Tracking House Account      Comment: We were unable to identify an account number for the order submitted. If you do not have a [facility]  account number or if your account information needs  to be updated please call [phone] for assistance. . To prevent delays in testing and processing of your orders please provide the following information for this order and with every additional order submitted: [facility] account number and account name  Client address Client phone and fax number NPI number of ordering physician along with the physician name.   WOUND CULTURE     Status: None   Collection Time: 12/12/19 11:00 AM  Result Value Ref Range   MICRO NUMBER: [id number]    SPECIMEN QUALITY: Adequate    SOURCE: R BALL OF THE FOOT ULCER    STATUS: FINAL    GRAM STAIN:      No white blood cells seen No epithelial cells seen Moderate Gram negative bacilli   RESULT:      A mix of non-predominating organisms of questionable significance was recovered on culture and not further identified. (Note: Growth did not detect the presence of S.aureus, beta-hemolytic Streptococci or P.aeruginosa).  Lactic acid,  plasma     Status: None   Collection Time: 01/27/20  5:43 PM  Result Value Ref Range   Lactic Acid, Venous 1.9 0.5 - 1.9 mmol/L    Comment: Performed at [facility], [place] [address] [place], [place] [address]  Comprehensive metabolic panel     Status: Abnormal   Collection Time: 01/27/20  5:43 PM  Result Value Ref Range   Sodium 136 135 - 145 mmol/L   Potassium 4.8 3.5 - 5.1 mmol/L   Chloride 95 (L) 98 - 111 mmol/L   CO2 27 22 - 32 mmol/L   Glucose, Bld 93 70 - 99 mg/dL    Comment: Glucose reference range applies only to samples taken after fasting for at least 8 hours.   BUN 44 (H) 6 - 20 mg/dL   Creatinine, Ser 9.91 (H) 0.61 - 1.24 mg/dL   Calcium 8.9 8.9 - 10.3 mg/dL  Total Protein 7.0 6.5 - 8.1 g/dL   Albumin 3.2 (L) 3.5 - 5.0 g/dL   AST 27 15 - 41 U/L   ALT 22 0 - 44 U/L   Alkaline Phosphatase 139 (H) 38 - 126 U/L   Total Bilirubin 1.0 0.3 - 1.2 mg/dL   GFR calc non Af Amer 6 (L) >60 mL/min   GFR calc Af Amer 7 (L) >60 mL/min   Anion gap 14 5 - 15    Comment: Performed at [facility] [address], [place], [place] [address]  CBC with Differential     Status: Abnormal   Collection Time: 01/27/20  5:43 PM  Result Value Ref Range   WBC 10.5 4.0 - 10.5 K/uL   RBC 5.13 4.22 - 5.81 MIL/uL   Hemoglobin 15.8 13.0 - 17.0 g/dL   HCT 49.4 39.0 - 52.0 %   MCV 96.3 80.0 - 100.0 fL   MCH 30.8 26.0 - 34.0 pg   MCHC 32.0 30.0 - 36.0 g/dL   RDW 12.4 11.5 - 15.5 %   Platelets 190 150 - 400 K/uL   nRBC 0.0 0.0 - 0.2 %   Neutrophils Relative % 67 %   Neutro Abs 7.0 1.7 - 7.7 K/uL   Lymphocytes Relative 16 %   Lymphs Abs 1.7 0.7 - 4.0 K/uL   Monocytes Relative 9 %   Monocytes Absolute 1.0 0.1 - 1.0 K/uL   Eosinophils Relative 6 %   Eosinophils Absolute 0.7 (H) 0.0 - 0.5 K/uL   Basophils Relative 1 %   Basophils Absolute 0.1 0.0 - 0.1 K/uL   Immature Granulocytes 1 %   Abs Immature Granulocytes 0.07 0.00 - 0.07 K/uL    Comment: Performed at [facility] [address] [place], [place] [address]  CBG monitoring, ED     Status: Abnormal   Collection Time: 01/27/20 10:11 PM  Result Value Ref Range   Glucose-Capillary 123 (H) 70 - 99 mg/dL    Comment: Glucose reference range applies only to samples taken after fasting for at least 8 hours.  Glucose, capillary     Status: Abnormal   Collection Time: 01/29/20 12:19 PM  Result Value Ref Range   Glucose-Capillary 127 (H) 70 - 99 mg/dL    Comment: Glucose reference range applies only to samples taken after fasting for at least 8 hours.   Comment 1 Notify RN    Comment 2 Document in Chart   Basic metabolic panel     Status: Abnormal   Collection Time: 01/29/20  1:04 PM  Result Value Ref Range   Sodium 132 (L) 135 - 145 mmol/L   Potassium 7.1 (HH) 3.5 - 5.1 mmol/L    Comment: CRITICAL RESULT CALLED TO, READ BACK BY AND VERIFIED WITH: Rolan Bucco RN [phone] [facility]    Chloride 98 98 - 111 mmol/L   CO2 21 (L) 22 - 32 mmol/L   Glucose, Bld 163 (H) 70 - 99 mg/dL    Comment: Glucose reference range applies only to samples taken after fasting for at least 8 hours.   BUN 72 (H) 6 - 20 mg/dL   Creatinine, Ser 13.00 (H) 0.61 - 1.24 mg/dL   Calcium 8.2 (L) 8.9 - 10.3 mg/dL   GFR calc non Af Amer 4 (L) >60 mL/min   GFR calc Af Amer 5 (L) >60 mL/min   Anion gap 13 5 - 15    Comment: Performed at [facility], [place] [address], [place], [place] [address]    Objective: There were no vitals filed for this visit.  General: Patient is awake, alert, oriented x 3 and in no acute distress.  Dermatology: Skin is warm and dry bilateral with a full thickness ulceration present plantar lateral left foot. Ulceration measures 2.5 cm x 4cm x 0.3 cm. There is a ruptured blistered skin and severely macerated border with a fibrotic base. The ulceration does not probe to bone. There is malodor, clear active drainage, minimal erythema, focal edema. No other acute signs of infection.    Vascular: Dorsalis Pedis pulse =1 /4 Bilateral,  Posterior Tibial pulse = 1/4 Bilateral,  Capillary Fill Time < 5 seconds  Neurologic: Protective sensation absent bilateral.  Musculosketal: + deformity with history of multiple digital amputations   Recent Labs    04/24/19 0915 04/24/19 1939  GRAMSTAIN  --  NO WBC SEEN FEW GRAM POSITIVE COCCI   LABORGA STAPHYLOCOCCUS AUREUS STAPHYLOCOCCUS AUREUS  ENTEROCOCCUS FAECALIS    Assessment and Plan:  Problem List Items Addressed This Visit    None    Visit Diagnoses    Ulcer of left foot, limited to breakdown of skin ([place])    -  Primary   Diabetic polyneuropathy associated with type 2 diabetes mellitus ([place])       History of amputation       History of osteomyelitis       Infected blister of left foot, initial encounter         -Examined patient and discussed the progression of the wound and treatment alternatives. -Xrays reviewed from ER 5/1 - Excisionally dedbrided ulceration at left to healthy bleeding borders removing nonviable tissue using a sterile chisel blade and tissue nipper. Wound measures post debridement as above, Wound was debrided to the level of the dermis with viable wound base exposed to promote healing. Hemostasis was achieved with manuel pressure. Patient tolerated procedure well without any discomfort or anesthesia necessary for this wound debridement.  -Applied Iodosorb and dry sterile dressing and instructed patient to continue with daily dressings at home consisting of same with help from home nurse, New orders referral sent to advanced home care. -Orders placed for Cipro 400mg /iv with dialysis x 2 weeks -Advised patient to return to using wound/surgical shoe or well padded guaze dressing to decrease rubbing/sear on left lateral foot  - Advised patient to go to the ER or return to office if the wound worsens or if constitutional symptoms are present. -Dispensed post op shoe to use as instructed on right -Patient  to return to office for follow up care with Dr. March Rummage later this week or sooner if problems or issues arise.  Landis Martins, DPM

## 2020-01-30 NOTE — Telephone Encounter (Signed)
It will be available in the morning

## 2020-01-30 NOTE — ED Provider Notes (Signed)
[facility] EMERGENCY DEPARTMENT Provider Note   CSN: [id number] Arrival date & time: 01/30/20  1958     History Chief Complaint  Patient presents with  . Abnormal Lab Tests    [name] is a 39 y.o. male.  The history is provided by the patient and medical records. No language interpreter was used.  Foot Pain This is a recurrent problem. The current episode started more than 2 days ago. The problem occurs constantly. The problem has been gradually worsening. Pertinent negatives include no chest pain, no abdominal pain, no headaches and no shortness of breath. Nothing aggravates the symptoms. Nothing relieves the symptoms. He has tried nothing for the symptoms. The treatment provided no relief.       Past Medical History:  Diagnosis Date  . Diabetes mellitus without complication ([place])   . Diabetic gastroparesis ([place])   . Dialysis patient ([place])   . Hypertension   . Renal disorder    Dialysis  . Sepsis [facility])     Patient Active Problem List   Diagnosis Date Noted  . Allergy, unspecified, initial encounter 07/17/2019  . Diarrhea 04/24/2019  . Bone erosion determined by x-ray   . Encephalopathy acute   . Diabetic ulcer of left midfoot associated with diabetes mellitus due to underlying condition, with necrosis of bone ([place])   . Open wound of left foot   . Encounter for orthopedic aftercare following surgical amputation   . Foot infection   . Gangrene of toe of left foot ([place])   . Cellulitis 10/26/2018  . Diabetic ulcer of left midfoot associated with diabetes mellitus due to underlying condition, with bone involvement without evidence of necrosis ([place])   . Nausea vomiting and diarrhea 10/20/2018  . Septic arthritis of left foot ([place])   . DM (diabetes mellitus), secondary, uncontrolled, with neurologic complications ([place])   . Headache 09/23/2018  . Osteomyelitis of foot, left, acute ([place])   . Acute osteomyelitis involving ankle and foot, left ([place])     . Osteomyelitis of fifth toe of left foot ([place])   . Anemia of chronic disease 08/18/2018  . Encounter for immunization 06/29/2018  . Unspecified protein-calorie malnutrition ([place]) 02/14/2018  . Encounter for removal of sutures 02/07/2018  . Osteomyelitis ([place]) 02/01/2018  . Fluid overload, unspecified 01/22/2018  . Osteomyelitis of ankle or foot, acute, right ([place]) 10/30/2017  . Cellulitis and abscess of toe of left foot   . Diabetic foot infection ([place])   . Insulin dependent diabetes mellitus 10/25/2017  . Diabetic gastroparesis ([place]) 10/25/2017  . Sepsis ([place]) 10/25/2017  . Fever, unspecified 10/28/2016  . Iron deficiency anemia, unspecified 10/28/2016  . Other specified coagulation defects ([place]) 10/28/2016  . Pruritus, unspecified 10/28/2016  . Secondary hyperparathyroidism of renal origin ([place]) 10/28/2016  . Shortness of breath 10/28/2016  . HTN (hypertension) 08/09/2014  . ESRD on dialysis ([place]) 03/19/2014  . MSSA bacteremia 05/31/2013  . Metabolic acidosis 34/74/2595    Past Surgical History:  Procedure Laterality Date  . AMPUTATION Right 02/02/2018   Procedure: RIGHT FIFTH TOE AND METATARSAL AMPUTATION. Filetted toe flap metatarsal resection. Debridement Plantar Foot wound;  Surgeon: Evelina Bucy, DPM;  Location: [facility];  Service: Podiatry;  Laterality: Right;  . AMPUTATION Left 08/20/2018   Procedure: FIFTH METATARSAL BONE BIOPSY;  Surgeon: Evelina Bucy, DPM;  Location: [facility];  Service: Podiatry;  Laterality: Left;  . AMPUTATION Left 10/28/2018   Procedure: LEFT GREAT TOE AMPUTATION;  Surgeon: Evelina Bucy, DPM;  Location: [facility];  Service: Podiatry;  Laterality: Left;  . APPLICATION OF WOUND VAC  02/02/2018   Procedure: APPLICATION OF WOUND VAC  Right Foot;  Surgeon: Evelina Bucy, DPM;  Location: [facility] [place];  Service: Podiatry;;  . APPLICATION OF WOUND VAC Left 10/28/2018   Procedure: APPLICATION OF WOUND VAC LEFT TOE;  Surgeon: Evelina Bucy, DPM;  Location: [facility];  Service: Podiatry;  Laterality: Left;  . APPLICATION OF WOUND VAC Left 11/01/2018   Procedure: APPLICATION OF WOUND VAC;  Surgeon: Evelina Bucy, DPM;  Location: [facility];  Service: Podiatry;  Laterality: Left;  . AV FISTULA PLACEMENT     left arm.  . AV FISTULA PLACEMENT Right 12/22/2016   Procedure: INSERTION OF ARTERIOVENOUS (AV) GORE-TEX GRAFT ARM;  Surgeon: Elam Dutch, MD;  Location: [facility] OR;  Service: Vascular;  Laterality: Right;  . AV FISTULA PLACEMENT Left 05/26/2018   Procedure: INSERTION OF  ARTERIOVENOUS (AV) GORE-TEX GRAFT LEFT ARM;  Surgeon: Serafina Mitchell, MD;  Location: [facility];  Service: Vascular;  Laterality: Left;  . EYE SURGERY    . I & D EXTREMITY Right 10/31/2017   Procedure: IRRIGATION AND DEBRIDEMENT RIGHT FOOT;  Surgeon: Evelina Bucy, DPM;  Location: [facility];  Service: Podiatry;  Laterality: Right;  . I & D EXTREMITY Left 08/20/2018   Procedure: IRRIGATION AND DEBRIDEMENT EXTREMITY WITH SECONDARY WOUND CLOSUREAND APPLICATION OF WOUND VAC LEFT FOOT;  Surgeon: Evelina Bucy, DPM;  Location: [place];  Service: Podiatry;  Laterality: Left;  . I & D EXTREMITY Left 10/20/2018   Procedure: IRRIGATION AND DEBRIDEMENT LEFT FOOT  DEBRIDEMENT LATERAL FOOT WOUND;  Surgeon: Evelina Bucy, DPM;  Location: [place];  Service: Podiatry;  Laterality: Left;  . I & D EXTREMITY Left 10/28/2018   Procedure: IRRIGATION AND DEBRIDEMENT LEFT TOE;  Surgeon: Evelina Bucy, DPM;  Location: [facility];  Service: Podiatry;  Laterality: Left;  . INSERTION OF DIALYSIS CATHETER     Right subclavian  . IR AV DIALY SHUNT INTRO NEEDLE/INTRACATH INITIAL W/PTA/IMG RIGHT Right 02/05/2018  . IR THROMBECTOMY AV FISTULA W/THROMBOLYSIS/PTA INC/SHUNT/IMG LEFT Left 08/24/2018  . IR THROMBECTOMY AV FISTULA W/THROMBOLYSIS/PTA INC/SHUNT/IMG LEFT Left 01/06/2019  . IR US GUIDE VASC ACCESS LEFT  08/24/2018  . IR US GUIDE VASC ACCESS RIGHT  02/05/2018  . IRRIGATION AND DEBRIDEMENT FOOT Right 10/23/2018   Procedure:  Irrigation and Debridement to tendon, Left Foot;  Surgeon: Evelina Bucy, DPM;  Location: [facility];  Service: Podiatry;  Laterality: Right;  . IRRIGATION AND DEBRIDEMENT FOOT Left 11/01/2018   Procedure: IRRIGATION AND DEBRIDEMENT PARTIAL WOUND CLOSURE LOCAL TISSUE TRANSFER AND FLAP ROTATION, LEFT FOOT;  Surgeon: Evelina Bucy, DPM;  Location: [facility];  Service: Podiatry;  Laterality: Left;  . TRANSMETATARSAL AMPUTATION N/A 08/18/2018   Procedure: IRRIGATION AND DEBRIDEMENT OF LEFT 5TH TOE AND TRANSMETATARSAL, WITH PARTICAL LEFT 5TH TOE AND METATARSAL AMPUTATION, BONE BIOPSY, WOUND VAC APPLICATION.;  Surgeon: Evelina Bucy, DPM;  Location: [facility];  Service: Podiatry;  Laterality: N/A;  . UPPER EXTREMITY VENOGRAPHY N/A 11/16/2016   Procedure: Upper Extremity Venography - Right Central;  Surgeon: Elam Dutch, MD;  Location: [facility] CV LAB;  Service: Cardiovascular;  Laterality: N/A;  . UPPER EXTREMITY VENOGRAPHY N/A 05/25/2018   Procedure: UPPER EXTREMITY VENOGRAPHY - Bilateral;  Surgeon: Marty Heck, MD;  Location: [facility] CV LAB;  Service: Cardiovascular;  Laterality: N/A;       Family History  Problem Relation  Age of Onset  . Diabetes Mellitus II Other   . Diabetes Father   . Renal Disease Father        ESRD    Social History   Tobacco Use  . Smoking status: Never Smoker  . Smokeless tobacco: Never Used  Substance Use Topics  . Alcohol use: No  . Drug use: No    Home Medications Prior to Admission medications   Medication Sig Start Date End Date Taking? Authorizing Provider  Amino Acids-Protein Hydrolys (PRO-STAT) LIQD Take by mouth. 08/25/18   [provider]  aspirin 325 MG tablet Take by mouth. 04/27/19   [provider]  calcitRIOL (ROCALTROL) 0.5 MCG capsule Take 5 capsules (2.5 mcg total) by mouth every Monday, Wednesday, and Friday with hemodialysis. 10/26/18   Hongalgi, Lenis Dickinson, MD  cinacalcet (SENSIPAR) 60 MG tablet Take by mouth.  08/16/19   [provider]  ciprofloxacin (CIPRO) 500 MG tablet Take 1 tablet (500 mg total) by mouth daily with breakfast for 7 days. 01/27/20 02/03/20  Delia Heady, PA-C  clonazePAM (KLONOPIN) 0.5 MG tablet Take by mouth. 10/24/18   [provider]  collagenase (SANTYL) ointment Apply topically. 10/24/18   [provider]  glucose blood (ACCU-CHEK AVIVA PLUS) test strip Accu-Chek Aviva Plus test strp 11/24/17   [provider]  heparin 1000 UNIT/ML injection Heparin Sodium (Porcine) 1,000 Units/mL Systemic 02/17/19 02/16/20  [provider]  insulin glargine (LANTUS) 100 UNIT/ML injection Inject into the skin. 10/24/18   [provider]  Lactobacillus (ACIDOPHILUS) CAPS capsule Take by mouth. 08/25/18   [provider]  losartan (COZAAR) 50 MG tablet Take by mouth. 08/25/18   [provider]  metoCLOPramide (REGLAN) 5 MG tablet Take by mouth. 08/25/18   [provider]  multivitamin (RENA-VIT) TABS tablet Take by mouth. 10/24/18   [provider]  NOVOLOG 100 UNIT/ML injection INJECT 6 TO 7 UNITS SUBCUTANEOUSLY TWICE DAILY WITH A MEAL 11/10/19   Kerin Perna, NP  ondansetron [facility]) 4 MG tablet Take by mouth. 04/28/19   [provider]  oxycodone (OXY-IR) 5 MG capsule Take 1 capsule (5 mg total) by mouth every 6 (six) hours as needed. 01/27/20   Khatri, Hina, PA-C  promethazine (PHENERGAN) 25 MG tablet Take by mouth. 12/15/17   [provider]  sevelamer carbonate (RENVELA) 800 MG tablet Take by mouth. 04/27/19   [provider]  sildenafil (VIAGRA) 100 MG tablet Take by mouth. 03/16/18   [provider]  silver sulfADIAZINE (SILVADENE) 1 % cream Apply topically. 04/28/19   [provider]  sulfamethoxazole-trimethoprim (BACTRIM) 400-80 MG tablet Take 1 tablet by mouth 2 (two) times daily. 12/12/19   Landis Martins, DPM  traMADol (ULTRAM) 50 MG tablet Take by mouth.  01/11/19   [provider]  Vancomycin (VANCOCIN) 750-5 MG/150ML-% SOLN Inject 150 mLs (750 mg total) into the vein every Monday, Wednesday, and Friday with hemodialysis. 04/28/19   Earlene Plater, MD    Allergies    Patient has no known allergies.  Review of Systems   Review of Systems  Constitutional: Negative for chills, diaphoresis, fatigue and fever.  HENT: Negative for congestion.   Respiratory: Negative for cough, chest tightness, shortness of breath and wheezing.   Cardiovascular: Negative for chest pain.  Gastrointestinal: Positive for nausea and vomiting. Negative for abdominal pain, constipation and diarrhea.  Genitourinary: Negative for flank pain.  Musculoskeletal: Negative for back pain and neck pain.  Skin: Positive for wound.  Neurological: Negative for weakness, light-headedness, numbness and headaches.  All other systems reviewed and are negative.   Physical Exam Updated Vital Signs BP (!) 153/90 (BP Location: Right Arm)   Pulse 74   Temp 98.4 F (36.9 C) (Oral)   Resp 16   SpO2 99%   Physical Exam Vitals and nursing note reviewed.  Constitutional:      General: He is not in acute distress.    Appearance: He is well-developed. He is not ill-appearing, toxic-appearing or diaphoretic.  HENT:     Head: Normocephalic and atraumatic.     Nose: Nose normal. No congestion or rhinorrhea.     Mouth/Throat:     Mouth: Mucous membranes are moist.     Pharynx: No oropharyngeal exudate or posterior oropharyngeal erythema.  Eyes:     Extraocular Movements: Extraocular movements intact.     Conjunctiva/sclera: Conjunctivae normal.     Pupils: Pupils are equal, round, and reactive to light.  Cardiovascular:     Rate and Rhythm: Normal rate and regular rhythm.     Pulses: Normal pulses.     Heart sounds: No murmur.  Pulmonary:     Effort: Pulmonary effort is normal. No respiratory distress.     Breath sounds: Normal breath sounds. No wheezing, rhonchi or  rales.  Chest:     Chest wall: No tenderness.  Abdominal:     General: Abdomen is flat.     Palpations: Abdomen is soft.     Tenderness: There is no abdominal tenderness. There is no right CVA tenderness, left CVA tenderness, guarding or rebound.  Musculoskeletal:        General: Tenderness present.     Cervical back: Neck supple. No tenderness.     Comments: Dressing removed and left foot was examined with a foul-smelling large wound on the left lateral foot.  Patient could move feet and had palpable pulses.  Has sensation of the foot.  Diffuse tenderness of the foot.  Skin:    General: Skin is warm and dry.     Capillary Refill: Capillary refill takes less than 2 seconds.     Findings: Erythema present.  Neurological:     General: No focal deficit present.     Mental Status: He is alert.     Sensory: No sensory deficit.     Motor: No weakness.  Psychiatric:        Mood and Affect: Mood normal.     ED Results / Procedures / Treatments   Labs (all labs ordered are listed, but only abnormal results are displayed) Labs Reviewed  CBC WITH DIFFERENTIAL/PLATELET - Abnormal; Notable for the following components:      Result Value   WBC 10.9 (*)    Neutro Abs 8.6 (*)    All other components within normal limits  COMPREHENSIVE METABOLIC PANEL - Abnormal; Notable for the following components:   Sodium 133 (*)    Potassium 6.7 (*)    Chloride 95 (*)    Glucose, Bld 206 (*)    BUN 89 (*)    Creatinine, Ser 14.87 (*)    Calcium 8.2 (*)    Albumin 3.3 (*)    Alkaline Phosphatase 140 (*)    GFR calc non Af Amer 4 (*)    GFR calc Af Amer 4 (*)    All other components within normal limits  CBG MONITORING, ED - Abnormal; Notable for the following components:   Glucose-Capillary 180 (*)    All other components  within normal limits  RESPIRATORY PANEL BY RT PCR (FLU A&B, COVID)    EKG EKG Interpretation  Date/Time:  Tuesday Jan 30 2020 23:03:23 EDT Ventricular Rate:  71 PR  Interval:    QRS Duration: 97 QT Interval:  393 QTC Calculation: 428 R Axis:   7 Text Interpretation: Sinus rhythm Borderline prolonged PR interval Low voltage, precordial leads Consider anterior infarct ST elevation, consider inferior injury Partial missing lead(s): V1 When compared to prior, sharper t waves. No STEMI Confirmed by Antony Blackbird [phone]) on 01/30/2020 11:59:48 PM   Radiology No results found.  Procedures Procedures (including critical care time)  CRITICAL CARE Performed by: Gwenyth Allegra Walter Grima Total critical care time: 35 minutes Critical care time was exclusive of separately billable procedures and treating other patients. Critical care was necessary to treat or prevent imminent or life-threatening deterioration. Critical care was time spent personally by me on the following activities: development of treatment plan with patient and/or surrogate as well as nursing, discussions with consultants, evaluation of patient's response to treatment, examination of patient, obtaining history from patient or surrogate, ordering and performing treatments and interventions, ordering and review of laboratory studies, ordering and review of radiographic studies, pulse oximetry and re-evaluation of patient's condition.   Medications Ordered in ED Medications  vancomycin (VANCOREADY) IVPB 1500 mg/300 mL (has no administration in time range)  vancomycin (VANCOCIN) IVPB 750 mg/150 ml premix (has no administration in time range)  sodium zirconium cyclosilicate (LOKELMA) packet 10 g (has no administration in time range)  calcium gluconate 1 g/ 50 mL sodium chloride IVPB (1,000 mg Intravenous New Bag/Given 01/30/20 2343)  insulin aspart (novoLOG) injection 5 Units (5 Units Intravenous Given 01/30/20 2344)    And  dextrose 50 % solution 50 mL (50 mLs Intravenous Given 01/30/20 2344)  albuterol (VENTOLIN HFA) 108 (90 Base) MCG/ACT inhaler 2 puff (2 puffs Inhalation Given 01/30/20 2344)    piperacillin-tazobactam (ZOSYN) IVPB 3.375 g (3.375 g Intravenous New Bag/Given 01/30/20 2345)  HYDROmorphone (DILAUDID) injection 1 mg (1 mg Intravenous Given 01/30/20 2344)    ED Course  I have reviewed the triage vital signs and the nursing notes.  Pertinent labs & imaging results that were available during my care of the patient were reviewed by me and considered in my medical decision making (see chart for details).    MDM Rules/Calculators/A&P                      ZARIN KNUPP is a 39 y.o. male with a past medical history significant for ESRD on dialysis MWF, hypertension, diabetes, gastroparesis, left foot osteomyelitis currently not taking his antibiotics who presents with electrolyte abnormalities and worsening foot infection.  Patient reports that he did not go to his dialysis yesterday and would not give a specific reason why but he is concerned about his foot infection.  He says that he was seen several days ago and was diagnosed with osteomyelitis of his left foot and was supposed to take antibiotics but he reports with his gastroparesis, he has not been able to take any medications.  He says that his doctor was going to start him on IV antibiotics when he gets dialysis.  He says that he had blood work done yesterday and was called and told to come to the emergency department urgently because his potassium was greater than 7.  He denies any chest pain, palpitations, shortness of breath.  He denies any abdominal pain at this time.  He  reports chronic nausea and vomiting that are unchanged from baseline.  He does not make urine and is having stool incontinence which she reports he has been struggling with.  He reports the pain in his foot is a 7 out of 10 and it is still draining a foul-smelling purulence from the lateral side.  He reports that he is not taking his antibiotics because he cannot keep it down.  On exam, lungs are clear and chest is nontender.  Abdomen is nontender.  Patient  has some tenderness in the left foot with a foul-smelling wound on the lateral side.  I took down the dressing which was soaked and foul-smelling.  He is able to move his toes and he reports his sensation is unchanged from baseline.  EKG shows some more sharp T waves compared to prior.  No STEMI.  We will treat for hyperkalemia as we confirmed it is elevated with a potassium of 6.7 here.  Will call nephrology for likely dialysis this evening.  We will also start broad-spectrum antibiotics as he has not been taking his outpatient antibiotics and his wound is worsening.  We will get other labs.  Patient will be admitted for further management once we speak to nephrology and start his treatments.  12:20 AM Nephrology requests all of the hyperkalemia medications including Lokelma.  They will come see the patient for likely emergent dialysis.  They agree with antibiotics and admission to medicine for the osteomyelitis of the foot.  Patient was given some pain medicine.  Medicine team was called to admit for further management for his hyperkalemia and osteomyelitis of the foot.  Initially, patient refused the PCR coronavirus test, he may be amenable to the antigen nasal swab.   Final Clinical Impression(s) / ED Diagnoses Final diagnoses:  Hyperkalemia  Osteomyelitis of left foot, unspecified type ([name])     Clinical Impression: 1. Hyperkalemia   2. Osteomyelitis of left foot, unspecified type ([place])     Disposition: Admit  This note was prepared with assistance of Dragon voice recognition software. Occasional wrong-word or sound-a-like substitutions may have occurred due to the inherent limitations of voice recognition software.       Verdia Bolt, Gwenyth Allegra, MD 01/31/20 [phone]

## 2020-01-30 NOTE — Telephone Encounter (Signed)
Faxed cipro orders to [facility]. Prepared required form, and demographics for fax to [facility], once 01/30/2020 clinicals are available.

## 2020-01-30 NOTE — ED Triage Notes (Signed)
Patient received a call today advised to go to the hospital due to abnormal blood tests results , last hemodialysis treatment Friday last week .

## 2020-01-30 NOTE — Progress Notes (Addendum)
Pharmacy Antibiotic Note  [name] is a 39 y.o. male admitted on 01/30/2020 with cellulitis and / wound infection.  Pharmacy has been consulted for Vancomycin dosing.   Temp (24hrs), Avg:98.1 F (36.7 C), Min:97.7 F (36.5 C), Max:98.4 F (36.9 C)  Recent Labs  Lab 01/27/20 1743 01/29/20 1304 01/30/20 2022  WBC 10.5  --  10.9*  CREATININE 9.91* 13.00* 14.87*  LATICACIDVEN 1.9  --   --     Estimated Creatinine Clearance: 6.2 mL/min (A) (by C-G formula based on SCr of 14.87 mg/dL (H)).    No Known Allergies  Antimicrobials this admission: 5/4 Zosyn >>  5/4 Vancomycin >>   Dose adjustments this admission: N/a  Microbiology results: Pending   Plan:  - Vancomycin 1500mg  IV x 1 dose  - Followed by Vancomycin 750mg  IV every M-W-F after HD  - Monitor patients renal function and HD sessions - De-escalate ABX when appropriate   Thank you for allowing pharmacy to be a part of this patient's care.  Duanne Limerick PharmD. BCPS 01/30/2020 11:06 PM  Addum:  Cont zosyn 2.25 gm IV q8 hours

## 2020-01-30 NOTE — ED Notes (Signed)
Dr Sherry Ruffing aware K 6.9

## 2020-01-30 NOTE — ED Notes (Signed)
Pt refused Covid swab, states "ah hell no". Md notified.

## 2020-01-30 NOTE — Telephone Encounter (Signed)
-----   Message from [place], [place] sent at 01/30/2020  1:11 PM EDT ----- Regarding: Wound care orders: Advanced and IV antibioitcs with HD Apply Iodosorb and dry dressing to left foot 3x per week. Patient to see Dr. March Rummage on Friday   With Hemodialysis administer Cipro 400 IV for the next 2 weeks for left foot infection. Patient goes to dialysis on [address] [phone]

## 2020-01-31 ENCOUNTER — Inpatient Hospital Stay (HOSPITAL_COMMUNITY): Payer: Medicare Other

## 2020-01-31 ENCOUNTER — Encounter (HOSPITAL_COMMUNITY): Payer: Self-pay | Admitting: Internal Medicine

## 2020-01-31 DIAGNOSIS — Z79899 Other long term (current) drug therapy: Secondary | ICD-10-CM | POA: Diagnosis not present

## 2020-01-31 DIAGNOSIS — Y832 Surgical operation with anastomosis, bypass or graft as the cause of abnormal reaction of the patient, or of later complication, without mention of misadventure at the time of the procedure: Secondary | ICD-10-CM | POA: Diagnosis present

## 2020-01-31 DIAGNOSIS — E1043 Type 1 diabetes mellitus with diabetic autonomic (poly)neuropathy: Secondary | ICD-10-CM | POA: Diagnosis present

## 2020-01-31 DIAGNOSIS — Z841 Family history of disorders of kidney and ureter: Secondary | ICD-10-CM | POA: Diagnosis not present

## 2020-01-31 DIAGNOSIS — Z89412 Acquired absence of left great toe: Secondary | ICD-10-CM | POA: Diagnosis not present

## 2020-01-31 DIAGNOSIS — L97529 Non-pressure chronic ulcer of other part of left foot with unspecified severity: Secondary | ICD-10-CM | POA: Diagnosis present

## 2020-01-31 DIAGNOSIS — W19XXXA Unspecified fall, initial encounter: Secondary | ICD-10-CM | POA: Diagnosis present

## 2020-01-31 DIAGNOSIS — Z20822 Contact with and (suspected) exposure to covid-19: Secondary | ICD-10-CM | POA: Diagnosis present

## 2020-01-31 DIAGNOSIS — E875 Hyperkalemia: Secondary | ICD-10-CM

## 2020-01-31 DIAGNOSIS — N186 End stage renal disease: Secondary | ICD-10-CM

## 2020-01-31 DIAGNOSIS — L97429 Non-pressure chronic ulcer of left heel and midfoot with unspecified severity: Secondary | ICD-10-CM | POA: Diagnosis present

## 2020-01-31 DIAGNOSIS — L03116 Cellulitis of left lower limb: Secondary | ICD-10-CM | POA: Diagnosis present

## 2020-01-31 DIAGNOSIS — Z794 Long term (current) use of insulin: Secondary | ICD-10-CM | POA: Diagnosis not present

## 2020-01-31 DIAGNOSIS — Z5320 Procedure and treatment not carried out because of patient's decision for unspecified reasons: Secondary | ICD-10-CM | POA: Diagnosis present

## 2020-01-31 DIAGNOSIS — D631 Anemia in chronic kidney disease: Secondary | ICD-10-CM | POA: Diagnosis present

## 2020-01-31 DIAGNOSIS — K3184 Gastroparesis: Secondary | ICD-10-CM | POA: Diagnosis present

## 2020-01-31 DIAGNOSIS — Z992 Dependence on renal dialysis: Secondary | ICD-10-CM | POA: Diagnosis not present

## 2020-01-31 DIAGNOSIS — T8241XA Breakdown (mechanical) of vascular dialysis catheter, initial encounter: Secondary | ICD-10-CM | POA: Diagnosis present

## 2020-01-31 DIAGNOSIS — E10621 Type 1 diabetes mellitus with foot ulcer: Secondary | ICD-10-CM | POA: Diagnosis present

## 2020-01-31 DIAGNOSIS — Z833 Family history of diabetes mellitus: Secondary | ICD-10-CM | POA: Diagnosis not present

## 2020-01-31 DIAGNOSIS — E1022 Type 1 diabetes mellitus with diabetic chronic kidney disease: Secondary | ICD-10-CM | POA: Diagnosis present

## 2020-01-31 DIAGNOSIS — Z89421 Acquired absence of other right toe(s): Secondary | ICD-10-CM | POA: Diagnosis not present

## 2020-01-31 DIAGNOSIS — Z7982 Long term (current) use of aspirin: Secondary | ICD-10-CM | POA: Diagnosis not present

## 2020-01-31 HISTORY — PX: IR US GUIDE VASC ACCESS RIGHT: IMG2390

## 2020-01-31 HISTORY — PX: IR FLUORO GUIDE CV LINE RIGHT: IMG2283

## 2020-01-31 LAB — CBC WITH DIFFERENTIAL/PLATELET
Abs Immature Granulocytes: 0.02 10*3/uL (ref 0.00–0.07)
Basophils Absolute: 0 10*3/uL (ref 0.0–0.1)
Basophils Relative: 1 %
Eosinophils Absolute: 0.5 10*3/uL (ref 0.0–0.5)
Eosinophils Relative: 5 %
HCT: 46.6 % (ref 39.0–52.0)
Hemoglobin: 14.7 g/dL (ref 13.0–17.0)
Immature Granulocytes: 0 %
Lymphocytes Relative: 13 %
Lymphs Abs: 1.1 10*3/uL (ref 0.7–4.0)
MCH: 30.9 pg (ref 26.0–34.0)
MCHC: 31.5 g/dL (ref 30.0–36.0)
MCV: 97.9 fL (ref 80.0–100.0)
Monocytes Absolute: 0.4 10*3/uL (ref 0.1–1.0)
Monocytes Relative: 5 %
Neutro Abs: 6.5 10*3/uL (ref 1.7–7.7)
Neutrophils Relative %: 76 %
Platelets: 205 10*3/uL (ref 150–400)
RBC: 4.76 MIL/uL (ref 4.22–5.81)
RDW: 12.8 % (ref 11.5–15.5)
WBC: 8.5 10*3/uL (ref 4.0–10.5)
nRBC: 0 % (ref 0.0–0.2)

## 2020-01-31 LAB — RESPIRATORY PANEL BY RT PCR (FLU A&B, COVID)
Influenza A by PCR: NEGATIVE
Influenza B by PCR: NEGATIVE
SARS Coronavirus 2 by RT PCR: NEGATIVE

## 2020-01-31 LAB — GLUCOSE, CAPILLARY
Glucose-Capillary: 459 mg/dL — ABNORMAL HIGH (ref 70–99)
Glucose-Capillary: 167 mg/dL — ABNORMAL HIGH (ref 70–99)
Glucose-Capillary: 164 mg/dL — ABNORMAL HIGH (ref 70–99)
Glucose-Capillary: 304 mg/dL — ABNORMAL HIGH (ref 70–99)

## 2020-01-31 LAB — HEMOGLOBIN A1C
Hgb A1c MFr Bld: 8.9 % — ABNORMAL HIGH (ref 4.8–5.6)
Mean Plasma Glucose: 208.73 mg/dL

## 2020-01-31 LAB — TROPONIN I (HIGH SENSITIVITY): Troponin I (High Sensitivity): 4 ng/L (ref <18)

## 2020-01-31 MED ORDER — SEVELAMER CARBONATE 800 MG PO TABS: 2400.0000 mg | ORAL_TABLET | Freq: Two times a day (BID) | ORAL | Status: DC

## 2020-01-31 MED ORDER — PIPERACILLIN-TAZOBACTAM IN DEX 2-0.25 GM/50ML IV SOLN
2.2500 g | Freq: Three times a day (TID) | INTRAVENOUS | Status: DC
  Filled 2020-01-31: qty 50

## 2020-01-31 MED ORDER — CHLORHEXIDINE GLUCONATE CLOTH 2 % EX PADS
6.0000 | MEDICATED_PAD | Freq: Every day | CUTANEOUS | Status: DC
  Administered 2020-01-31: 6 via TOPICAL

## 2020-01-31 MED ORDER — ACETAMINOPHEN 325 MG PO TABS
650.0000 mg | ORAL_TABLET | Freq: Four times a day (QID) | ORAL | Status: DC | PRN
  Administered 2020-01-31: 650 mg via ORAL
  Filled 2020-01-31 (×2): qty 2

## 2020-01-31 MED ORDER — SODIUM ZIRCONIUM CYCLOSILICATE 10 G PO PACK
10.0000 g | PACK | Freq: Three times a day (TID) | ORAL | Status: AC
  Filled 2020-01-31: qty 1

## 2020-01-31 MED ORDER — BACITRACIN ZINC 500 UNIT/GM EX OINT
1.0000 "application " | TOPICAL_OINTMENT | Freq: Two times a day (BID) | CUTANEOUS | Status: DC
  Administered 2020-02-01 (×2): 1 via TOPICAL
  Filled 2020-01-31 (×2): qty 28.4

## 2020-01-31 MED ORDER — PRO-STAT SUGAR FREE PO LIQD
30.0000 mL | Freq: Two times a day (BID) | ORAL | Status: DC
  Filled 2020-01-31: qty 30

## 2020-01-31 MED ORDER — LORAZEPAM 2 MG/ML IJ SOLN
INTRAMUSCULAR | Status: AC
  Filled 2020-01-31: qty 1

## 2020-01-31 MED ORDER — ONDANSETRON HCL 4 MG/2ML IJ SOLN
4.0000 mg | Freq: Four times a day (QID) | INTRAMUSCULAR | Status: DC | PRN
  Administered 2020-01-31: 4 mg via INTRAVENOUS
  Filled 2020-01-31: qty 2

## 2020-01-31 MED ORDER — HEPARIN SODIUM (PORCINE) 1000 UNIT/ML IJ SOLN
INTRAMUSCULAR | Status: AC
  Administered 2020-01-31: 2.6 mL
  Filled 2020-01-31: qty 1

## 2020-01-31 MED ORDER — CALCITRIOL 0.5 MCG PO CAPS
2.2500 ug | ORAL_CAPSULE | ORAL | Status: DC
  Filled 2020-01-31: qty 1

## 2020-01-31 MED ORDER — CEPHALEXIN 500 MG PO CAPS
500.0000 mg | ORAL_CAPSULE | Freq: Two times a day (BID) | ORAL | Status: DC
  Administered 2020-02-01 (×2): 500 mg via ORAL
  Filled 2020-01-31 (×4): qty 1

## 2020-01-31 MED ORDER — HYDRALAZINE HCL 20 MG/ML IJ SOLN: 5.0000 mg | INTRAMUSCULAR | Status: DC | PRN

## 2020-01-31 MED ORDER — IOHEXOL 300 MG/ML  SOLN
50.0000 mL | Freq: Once | INTRAMUSCULAR | Status: AC | PRN
  Administered 2020-01-31: 5 mL via INTRAVENOUS

## 2020-01-31 MED ORDER — CHLORHEXIDINE GLUCONATE 4 % EX LIQD
CUTANEOUS | Status: AC
  Filled 2020-01-31: qty 15

## 2020-01-31 MED ORDER — SEVELAMER CARBONATE 800 MG PO TABS
3200.0000 mg | ORAL_TABLET | Freq: Three times a day (TID) | ORAL | Status: DC
  Filled 2020-01-31 (×2): qty 4

## 2020-01-31 MED ORDER — CINACALCET HCL 30 MG PO TABS
90.0000 mg | ORAL_TABLET | Freq: Every day | ORAL | Status: DC
  Filled 2020-01-31: qty 3

## 2020-01-31 MED ORDER — LORAZEPAM 2 MG/ML IJ SOLN: 1.0000 mg | Freq: Once | INTRAMUSCULAR | Status: DC

## 2020-01-31 MED ORDER — HYDROMORPHONE HCL 1 MG/ML IJ SOLN
0.5000 mg | Freq: Once | INTRAMUSCULAR | Status: AC | PRN
  Administered 2020-01-31: 0.5 mg via INTRAVENOUS
  Filled 2020-01-31: qty 0.5

## 2020-01-31 MED ORDER — INSULIN ASPART 100 UNIT/ML ~~LOC~~ SOLN: 0.0000 [IU] | Freq: Three times a day (TID) | SUBCUTANEOUS | Status: DC

## 2020-01-31 MED ORDER — LIDOCAINE HCL (PF) 1 % IJ SOLN
INTRAMUSCULAR | Status: DC | PRN
  Administered 2020-01-31: 5 mL

## 2020-01-31 MED ORDER — INSULIN ASPART 100 UNIT/ML ~~LOC~~ SOLN: 6.0000 [IU] | Freq: Once | SUBCUTANEOUS | Status: DC

## 2020-01-31 MED ORDER — HYDROMORPHONE HCL 2 MG PO TABS
1.0000 mg | ORAL_TABLET | Freq: Four times a day (QID) | ORAL | Status: AC | PRN
  Administered 2020-02-01 (×2): 2 mg via ORAL
  Filled 2020-01-31 (×2): qty 1

## 2020-01-31 MED ORDER — HEPARIN SODIUM (PORCINE) 5000 UNIT/ML IJ SOLN
5000.0000 [IU] | Freq: Three times a day (TID) | INTRAMUSCULAR | Status: DC
  Filled 2020-01-31 (×2): qty 1

## 2020-01-31 MED ORDER — LIDOCAINE HCL 1 % IJ SOLN
INTRAMUSCULAR | Status: AC
  Filled 2020-01-31: qty 20

## 2020-01-31 MED ORDER — INSULIN ASPART 100 UNIT/ML ~~LOC~~ SOLN
3.0000 [IU] | Freq: Once | SUBCUTANEOUS | Status: AC
  Administered 2020-01-31: 3 [IU] via SUBCUTANEOUS

## 2020-01-31 MED ORDER — PIPERACILLIN-TAZOBACTAM 3.375 G IVPB 30 MIN: 3.3750 g | Freq: Once | INTRAVENOUS | Status: DC

## 2020-01-31 MED ORDER — RENA-VITE PO TABS
1.0000 | ORAL_TABLET | Freq: Every day | ORAL | Status: DC
  Administered 2020-02-01: 1 via ORAL
  Filled 2020-01-31 (×2): qty 1

## 2020-01-31 NOTE — Progress Notes (Signed)
Patient Status: [facility] - In-pt  Assessment and Plan: Patient in need of venous access for urgent dialysis.  Patient in need of dialysis access for hyperkalemia.  He is known to IR for outpatient attempt to establish access on Monday, however unsuccessful. His potassium remains elevated at 6.7 and is being medically managed.  He is not appropriate for sedation due to cardiac risk.  Plan made for temporary catheter placement today, declotting procedure tomorrow.   EXTENSIVE discussion held again with patient today. Ultimately he is agreeable to temporary HD catheter.   Risks and benefits discussed with the patient including, but not limited to bleeding, infection, vascular injury, pneumothorax which may require chest tube placement, air embolism or even death  All of the patient's questions were answered, patient is agreeable to proceed. Consent signed and in chart.  ______________________________________________________________________   History of Present Illness: [name] is a 39 y.o. male with history of HTN, IDDM, and renal failure on regular HD via LUA HD graft. He was scheduled for a declotting procedure on Monday in IR, however was found to have critical elevation of his potassium. He was sent to the ED for management of his hyperkalemia and foot infection after refusing a temporary HD catheter.  He is now admitting and needs work-up for reestablishing dialysis access.  Allergies and medications reviewed.   Review of Systems: A 12 point ROS discussed and pertinent positives are indicated in the HPI above.  All other systems are negative.  Review of Systems  Constitutional: Negative for fatigue and fever.  Respiratory: Negative for cough and shortness of breath.   Cardiovascular: Negative for chest pain.  Gastrointestinal: Negative for abdominal pain, nausea and vomiting.  Musculoskeletal: Negative for back pain.  Psychiatric/Behavioral: Negative for behavioral problems  and confusion.    Vital Signs: BP (!) 167/102 (BP Location: Right Arm)   Pulse 72   Temp (!) 97.5 F (36.4 C) (Oral)   Resp 20   SpO2 99%   Physical Exam Vitals and nursing note reviewed.  Constitutional:      Appearance: Normal appearance.  HENT:     Mouth/Throat:     Mouth: Mucous membranes are moist.     Pharynx: Oropharynx is clear.  Cardiovascular:     Rate and Rhythm: Normal rate and regular rhythm.  Pulmonary:     Effort: Pulmonary effort is normal. No respiratory distress.     Breath sounds: Normal breath sounds.  Abdominal:     General: Abdomen is flat.     Palpations: Abdomen is soft.  Musculoskeletal:     Comments: LUA graft without bruit or thrill  Skin:    General: Skin is warm and dry.  Neurological:     General: No focal deficit present.     Mental Status: He is alert and oriented to person, place, and time. Mental status is at baseline.  Psychiatric:        Mood and Affect: Mood normal.        Behavior: Behavior normal.        Thought Content: Thought content normal.        Judgment: Judgment normal.      Imaging reviewed.   Labs:  COAGS: Recent Labs    04/24/19 0819  INR 1.2  APTT 40*    BMP: Recent Labs    04/26/19 0551 01/27/20 1743 01/29/20 1304 01/30/20 2022  NA 130* 136 132* 133*  K 4.6 4.8 7.1* 6.7*  CL 93* 95* 98 95*  CO2 20* 27 21* 23  GLUCOSE 136* 93 163* 206*  BUN 77* 44* 72* 89*  CALCIUM 8.6* 8.9 8.2* 8.2*  CREATININE 15.56* 9.91* 13.00* 14.87*  GFRNONAA 3* 6* 4* 4*  GFRAA 4* 7* 5* 4*       Electronically Signed: Docia Barrier, PA 01/31/2020, 11:50 AM   I spent a total of 15 minutes in face to face in clinical consultation, greater than 50% of which was counseling/coordinating care for venous access.

## 2020-01-31 NOTE — Progress Notes (Signed)
CN note: Per AC pt. Bleeding profusely from HD cath. Paged Dr. Vernard Gambles, states put head up. States pt. Needs HD but pt. Refuses. AC notified of response.

## 2020-01-31 NOTE — ED Notes (Signed)
Called to give report; advised by Abbe Amsterdam, the Charge nurse that she will review chart to see if patient is appropriate and advised me to call back.

## 2020-01-31 NOTE — ED Notes (Signed)
Per Abbe Amsterdam, she spoke with [facility] and was advised by the [facility] that admitting could order an antigen test to check for Covid. Spoke with Hal Hope whom was not aware of any special blood test that could be performed to check for Covid only the nasopharageal. Asked patient again, for the 3rd time, whom said, "nah man, you gonna shove that shit up my nose". Advised pt that he has the right to refuse but it would be beneficial to obtain.

## 2020-01-31 NOTE — Progress Notes (Signed)
Pt has some containers of meds in his possession , he refused to hand them in for safe keeping. He  state "I do not want anyone to touch my meds"

## 2020-01-31 NOTE — Progress Notes (Signed)
Patient requested to have CBG recheck. On assessment CBG 167mg /dl, patient refused to have blood glucose coverage as he is NPO. Will continue to monitor blood glucose.

## 2020-01-31 NOTE — Progress Notes (Signed)
Inpatient Diabetes Program Recommendations  AACE/ADA: New Consensus Statement on Inpatient Glycemic Control (2015)  Target Ranges:  Prepandial:   less than 140 mg/dL      Peak postprandial:   less than 180 mg/dL (1-2 hours)      Critically ill patients:  140 - 180 mg/dL   Lab Results  Component Value Date   GLUCAP 164 (H) 01/31/2020   HGBA1C 8.9 (H) 01/31/2020    Review of Glycemic Control Results for [name], [name] (MRN [id number]) as of 01/31/2020 15:46  Ref. Range 01/30/2020 22:00 01/31/2020 06:27 01/31/2020 10:05 01/31/2020 12:30  Glucose-Capillary Latest Ref Range: 70 - 99 mg/dL 180 (H) 459 (H) 167 (H) 164 (H)   Diabetes history: DM 1- ESRD Outpatient Diabetes medications: Novolog 7 units tid with meals Current orders for Inpatient glycemic control:  Novolog 0-6 units tid  Inpatient Diabetes Program Recommendations:    Note patient has history of Type 1 DM.  Consider adding Lantus 5 units daily and Novolog 2 units tid with meals (hold if patient eats less than 50%).  Thanks  Adah Perl, RN, BC-ADM Inpatient Diabetes Coordinator Pager [phone] (8a-5p)

## 2020-01-31 NOTE — Procedures (Signed)
  Procedure: R IJ HD catheter placement [place] 15cm EBL:   minimal Complications:  none immediate  See full dictation in [facility].  Dillard Cannon MD Main # [phone] Pager  [phone]

## 2020-01-31 NOTE — Progress Notes (Signed)
Transporter notified this RN that pt is not willing to come down for procedure at this time.

## 2020-01-31 NOTE — Progress Notes (Addendum)
Patient requests pain medication for abdominal pain. Dr. Loleta Books text paged to notify. Awaiting orders at this time.

## 2020-01-31 NOTE — Consult Note (Signed)
Renal Service Consult Note [name]  [name] 01/31/2020 [name] Requesting Physician:  Dr [name]   Reason for Consult:  ESRD pt w/ clotted graft HPI: The patient is a 39 y.o. year-old w/ hx of DM type 1, ESRD on HD and HTN, hx toe amps bilat admitted for hyperkalemia and clotted AV graft. Also bilat pain in feet and legs.  Had a fall recently w/ poorly healing wounds bilat shins.  Also L foot ulcer. Sees a podiatrist.  Asked to see for ESRD.   Pt denies any CP, SOB, , abd pain. Having pain in lower ext mostly .   ROS  denies CP  no joint pain   no HA  no blurry vision  no rash  no diarrhea  no nausea/ vomiting     Past Medical History  Past Medical History:  Diagnosis Date  . Diabetes mellitus without complication ([place])   . Diabetic gastroparesis ([place])   . Dialysis patient ([place])   . Hypertension   . Renal disorder    Dialysis  . Sepsis [facility])    Past Surgical History  Past Surgical History:  Procedure Laterality Date  . AMPUTATION Right 02/02/2018   Procedure: RIGHT FIFTH TOE AND METATARSAL AMPUTATION. Filetted toe flap metatarsal resection. Debridement Plantar Foot wound;  Surgeon: [name], DPM;  Location: [place];  Service: Podiatry;  Laterality: Right;  . AMPUTATION Left 08/20/2018   Procedure: FIFTH METATARSAL BONE BIOPSY;  Surgeon: [name], DPM;  Location: [place];  Service: Podiatry;  Laterality: Left;  . AMPUTATION Left 10/28/2018   Procedure: LEFT GREAT TOE AMPUTATION;  Surgeon: [name], DPM;  Location: [place];  Service: Podiatry;  Laterality: Left;  . APPLICATION OF WOUND VAC  02/02/2018   Procedure: APPLICATION OF WOUND VAC  Right Foot;  Surgeon: [name], DPM;  Location: [place];  Service: Podiatry;;  . APPLICATION OF WOUND VAC Left 10/28/2018   Procedure: APPLICATION OF WOUND VAC LEFT TOE;  Surgeon: [name], DPM;  Location: [place];  Service: Podiatry;  Laterality: Left;  . APPLICATION OF WOUND  VAC Left 11/01/2018   Procedure: APPLICATION OF WOUND VAC;  Surgeon: [name], DPM;  Location: [place];  Service: Podiatry;  Laterality: Left;  . AV FISTULA PLACEMENT     left arm.  . AV FISTULA PLACEMENT Right 12/22/2016   Procedure: INSERTION OF ARTERIOVENOUS (AV) GORE-TEX GRAFT ARM;  Surgeon: Shane Dutch, MD;  Location: [facility] OR;  Service: Vascular;  Laterality: Right;  . AV FISTULA PLACEMENT Left 05/26/2018   Procedure: INSERTION OF  ARTERIOVENOUS (AV) GORE-TEX GRAFT LEFT ARM;  Surgeon: Shane Mitchell, MD;  Location: [place];  Service: Vascular;  Laterality: Left;  . EYE SURGERY    . I & D EXTREMITY Right 10/31/2017   Procedure: IRRIGATION AND DEBRIDEMENT RIGHT FOOT;  Surgeon: [name], DPM;  Location: [place];  Service: Podiatry;  Laterality: Right;  . I & D EXTREMITY Left 08/20/2018   Procedure: IRRIGATION AND DEBRIDEMENT EXTREMITY WITH SECONDARY WOUND CLOSUREAND APPLICATION OF WOUND VAC LEFT FOOT;  Surgeon: [name], DPM;  Location: [place];  Service: Podiatry;  Laterality: Left;  . I & D EXTREMITY Left 10/20/2018   Procedure: IRRIGATION AND DEBRIDEMENT LEFT FOOT  DEBRIDEMENT LATERAL FOOT WOUND;  Surgeon: [name], DPM;  Location: [place];  Service: Podiatry;  Laterality: Left;  . I & D EXTREMITY Left 10/28/2018   Procedure: IRRIGATION  AND DEBRIDEMENT LEFT TOE;  Surgeon: [name], DPM;  Location: [place];  Service: Podiatry;  Laterality: Left;  . INSERTION OF DIALYSIS CATHETER     Right subclavian  . IR AV DIALY SHUNT INTRO NEEDLE/INTRACATH INITIAL W/PTA/IMG RIGHT Right 02/05/2018  . IR THROMBECTOMY AV FISTULA W/THROMBOLYSIS/PTA INC/SHUNT/IMG LEFT Left 08/24/2018  . IR THROMBECTOMY AV FISTULA W/THROMBOLYSIS/PTA INC/SHUNT/IMG LEFT Left 01/06/2019  . IR US GUIDE VASC ACCESS LEFT  08/24/2018  . IR US GUIDE VASC ACCESS RIGHT  02/05/2018  . IRRIGATION AND DEBRIDEMENT FOOT Right 10/23/2018   Procedure: Irrigation and Debridement to tendon, Left Foot;  Surgeon: [name], DPM;  Location: [place];  Service: Podiatry;  Laterality: Right;  . IRRIGATION AND DEBRIDEMENT FOOT Left 11/01/2018   Procedure: IRRIGATION AND DEBRIDEMENT PARTIAL WOUND CLOSURE LOCAL TISSUE TRANSFER AND FLAP ROTATION, LEFT FOOT;  Surgeon: [name], DPM;  Location: [place];  Service: Podiatry;  Laterality: Left;  . TRANSMETATARSAL AMPUTATION N/A 08/18/2018   Procedure: IRRIGATION AND DEBRIDEMENT OF LEFT 5TH TOE AND TRANSMETATARSAL, WITH PARTICAL LEFT 5TH TOE AND METATARSAL AMPUTATION, BONE BIOPSY, WOUND VAC APPLICATION.;  Surgeon: [name], DPM;  Location: [place];  Service: Podiatry;  Laterality: N/A;  . UPPER EXTREMITY VENOGRAPHY N/A 11/16/2016   Procedure: Upper Extremity Venography - Right Central;  Surgeon: Shane Dutch, MD;  Location: [place] CV LAB;  Service: Cardiovascular;  Laterality: N/A;  . UPPER EXTREMITY VENOGRAPHY N/A 05/25/2018   Procedure: UPPER EXTREMITY VENOGRAPHY - Bilateral;  Surgeon: Shane Heck, MD;  Location: [place] CV [facility];  Service: Cardiovascular;  Laterality: N/A;   Family History  Family History  Problem Relation Age of Onset  . Diabetes Mellitus II Other   . Diabetes Father   . Renal Disease Father        ESRD   Social History  reports that he has never smoked. He has never used smokeless tobacco. He reports that he does not drink alcohol or use drugs. Allergies No Known Allergies Home medications Prior to Admission medications   Medication Sig Start Date End Date Taking? Authorizing Provider  NOVOLOG 100 UNIT/ML injection INJECT 6 TO 7 UNITS SUBCUTANEOUSLY TWICE DAILY WITH A MEAL Patient taking differently: Inject 7 Units into the skin 3 (three) times daily with meals.  11/10/19  Yes Shane Perna, NP  sevelamer carbonate (RENVELA) 800 MG tablet Take 2,400-4,000 mg by mouth See admin instructions. Take 5 tablets with meals then take 3 tablets with snacks 04/27/19  Yes [provider]  calcitRIOL (ROCALTROL)  0.5 MCG capsule Take 5 capsules (2.5 mcg total) by mouth every Monday, Wednesday, and Friday with hemodialysis. Patient not taking: Reported on 01/30/2020 10/26/18   Shane Jansky, MD  ciprofloxacin (CIPRO) 500 MG tablet Take 1 tablet (500 mg total) by mouth daily with breakfast for 7 days. 01/27/20 02/03/20  Khatri, Hina, PA-C  oxycodone (OXY-IR) 5 MG capsule Take 1 capsule (5 mg total) by mouth every 6 (six) hours as needed. 01/27/20   Khatri, Hina, PA-C  sulfamethoxazole-trimethoprim (BACTRIM) 400-80 MG tablet Take 1 tablet by mouth 2 (two) times daily. Patient not taking: Reported on 01/30/2020 12/12/19   Landis Martins, DPM  Vancomycin [facility]) 750-5 MG/150ML-% SOLN Inject 150 mLs (750 mg total) into the vein every Monday, Wednesday, and Friday with hemodialysis. Patient not taking: Reported on 01/30/2020 04/28/19   Earlene Plater, MD     Vitals:   01/31/20 3154 01/31/20 0417 01/31/20 0454 01/31/20 1013  BP:  Marland Kitchen)  151/95 (!) 168/84 (!) 167/102  Pulse: 82 84 84 72  Resp: 17 16 18 20   Temp: 97.7 F (36.5 C) 98 F (36.7 C) 98.1 F (36.7 C) (!) 97.5 F (36.4 C)  TempSrc: Oral Oral Oral Oral  SpO2: 96% 96% 98% 99%   Exam Gen alert, anxious No rash, cyanosis or gangrene Sclera anicteric, throat clear No jvd or bruit Chest clear bilat to bases no rales, wheezing or bronchial BS RRR no MRG Abd soft ntnd no mass or ascites +bs GU normal male  MS no joint effusions or deformity Ext no LE edema, large L lateral foot wound/ ulcer w/ eschar, no odor Neuro is alert, Ox 3 , nf LUA AVG no bruit    Home meds:  - novolog 7u ac/ rocaltrol 2.5 ug mwf/ cipro x 7d/ bactrim bid/ renvela ac     Outpt HD: MWF [facility]   4h  400/800   70kg   2/2 bath  Hep 5000+ 5044midrun  - calc 2.25 ug tiw   Assessment: 1. Clotted AVG 2. Hyperkalemia - no ekg changes 3. Anxiety 4. ESRD - usual HD MWF. +azotemia c/w missed HD.  5. Type 1 DM - per primary 6. 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 IR evaluating, prob will need temp cath then HD before they can attempt AVG declot. Appreciate assistance. HD after access placed.       Rob Aashrith Eves  MD 01/31/2020, 12:42 PM  Recent Labs  Lab 01/30/20 2022 01/31/20 0622  WBC 10.9* 8.5  HGB 14.9 14.7   Recent Labs  Lab 01/27/20 1743 01/27/20 1743 01/29/20 1304 01/30/20 2022  K 4.8   < > 7.1* 6.7*  BUN 44*   < > 72* 89*  CREATININE 9.91*   < > 13.00* 14.87*  CALCIUM 8.9  --  8.2* 8.2*   < > = values in this interval not displayed.

## 2020-01-31 NOTE — ED Notes (Signed)
Charge nurse called to inquire if the patient had a Covid screening. Nurse advised that pt refused. Will discuss with AC to see if pt appropriate for this unit.

## 2020-01-31 NOTE — Progress Notes (Signed)
Telephone call to primary floor nurse for HD report. Spoke with Rulon Abide RN who reports patient refuses to have HD treatment tonight. Telephone call to on-call Nephrologist Dr. Moshe Cipro and informed her patient was offered HD treatment tonight; however, the patient refuses HD.

## 2020-01-31 NOTE — Progress Notes (Signed)
Call to room to address behavior of patient. Security standing by. Patient very angry and colorfully expressive about his annoyance about having the HD catheter placed in his neck. Demanding that it be taken out and wanting to speak with placing physician.  .  Patient did allow this RN to visualize drsg (bleeding noted) and to clean back of head and replace pillow case. Drsg very loose and patient didn't want it touched due to soreness.  Head elevated per MD. Informed primary nurse of bleeding around and under drsg.

## 2020-01-31 NOTE — Consult Note (Addendum)
[facility]  INPATIENT CONSULTATION  Reason for Consultation: ESRD, hyperkalemia Requesting Provider: Dr. Sherry Ruffing  HPI: [name] is an 39 y.o. male ESRD on dialysis, gastroparesis, T2DM, HTN and h/o R fifth metarasal amputation who is seen for eval and management of hyperkalemia, ESRD and assoc conditions.   Presented to ED after outpt labs showing K 7.1 Monday and missed dialysis same day.  Repeat labs 5/4 showed K 6.7, bicarb 23, BUN 89.   Saw podiatry for L foot ulceration/drainage on Monday.  Had been seen in ED 5/1 for the same and given rx for po antibiotics but was unable to tolerate taking secondary to GI issues/gastroparesis. Plan to give cipro 400 IV qdialysis for next 2 weeks. Has not yet rec'd dose.   On discussion with patient missed HD due to clotted AVG so needs declot prior to HD.    PMH: Past Medical History:  Diagnosis Date  . Diabetes mellitus without complication ([place])   . Diabetic gastroparesis ([place])   . Dialysis patient ([place])   . Hypertension   . Renal disorder    Dialysis  . Sepsis ([place])    PSH: Past Surgical History:  Procedure Laterality Date  . AMPUTATION Right 02/02/2018   Procedure: RIGHT FIFTH TOE AND METATARSAL AMPUTATION. Filetted toe flap metatarsal resection. Debridement Plantar Foot wound;  Surgeon: Evelina Bucy, DPM;  Location: [place];  Service: Podiatry;  Laterality: Right;  . AMPUTATION Left 08/20/2018   Procedure: FIFTH METATARSAL BONE BIOPSY;  Surgeon: Evelina Bucy, DPM;  Location: [place];  Service: Podiatry;  Laterality: Left;  . AMPUTATION Left 10/28/2018   Procedure: LEFT GREAT TOE AMPUTATION;  Surgeon: Evelina Bucy, DPM;  Location: [place];  Service: Podiatry;  Laterality: Left;  . APPLICATION OF WOUND VAC  02/02/2018   Procedure: APPLICATION OF WOUND VAC  Right Foot;  Surgeon: Evelina Bucy, DPM;  Location: [place];  Service: Podiatry;;  . APPLICATION OF WOUND VAC Left 10/28/2018   Procedure: APPLICATION OF WOUND  VAC LEFT TOE;  Surgeon: Evelina Bucy, DPM;  Location: [place];  Service: Podiatry;  Laterality: Left;  . APPLICATION OF WOUND VAC Left 11/01/2018   Procedure: APPLICATION OF WOUND VAC;  Surgeon: Evelina Bucy, DPM;  Location: [place];  Service: Podiatry;  Laterality: Left;  . AV FISTULA PLACEMENT     left arm.  . AV FISTULA PLACEMENT Right 12/22/2016   Procedure: INSERTION OF ARTERIOVENOUS (AV) GORE-TEX GRAFT ARM;  Surgeon: Elam Dutch, MD;  Location: [facility] OR;  Service: Vascular;  Laterality: Right;  . AV FISTULA PLACEMENT Left 05/26/2018   Procedure: INSERTION OF  ARTERIOVENOUS (AV) GORE-TEX GRAFT LEFT ARM;  Surgeon: Serafina Mitchell, MD;  Location: [place];  Service: Vascular;  Laterality: Left;  . EYE SURGERY    . I & D EXTREMITY Right 10/31/2017   Procedure: IRRIGATION AND DEBRIDEMENT RIGHT FOOT;  Surgeon: Evelina Bucy, DPM;  Location: [place];  Service: Podiatry;  Laterality: Right;  . I & D EXTREMITY Left 08/20/2018   Procedure: IRRIGATION AND DEBRIDEMENT EXTREMITY WITH SECONDARY WOUND CLOSUREAND APPLICATION OF WOUND VAC LEFT FOOT;  Surgeon: Evelina Bucy, DPM;  Location: [place];  Service: Podiatry;  Laterality: Left;  . I & D EXTREMITY Left 10/20/2018   Procedure: IRRIGATION AND DEBRIDEMENT LEFT FOOT  DEBRIDEMENT LATERAL FOOT WOUND;  Surgeon: Evelina Bucy, DPM;  Location: [place];  Service: Podiatry;  Laterality: Left;  . I & D EXTREMITY Left 10/28/2018  Procedure: IRRIGATION AND DEBRIDEMENT LEFT TOE;  Surgeon: Evelina Bucy, DPM;  Location: [place];  Service: Podiatry;  Laterality: Left;  . INSERTION OF DIALYSIS CATHETER     Right subclavian  . IR AV DIALY SHUNT INTRO NEEDLE/INTRACATH INITIAL W/PTA/IMG RIGHT Right 02/05/2018  . IR THROMBECTOMY AV FISTULA W/THROMBOLYSIS/PTA INC/SHUNT/IMG LEFT Left 08/24/2018  . IR THROMBECTOMY AV FISTULA W/THROMBOLYSIS/PTA INC/SHUNT/IMG LEFT Left 01/06/2019  . IR US GUIDE VASC ACCESS LEFT  08/24/2018  . IR US GUIDE VASC ACCESS RIGHT  02/05/2018  .  IRRIGATION AND DEBRIDEMENT FOOT Right 10/23/2018   Procedure: Irrigation and Debridement to tendon, Left Foot;  Surgeon: Evelina Bucy, DPM;  Location: [place];  Service: Podiatry;  Laterality: Right;  . IRRIGATION AND DEBRIDEMENT FOOT Left 11/01/2018   Procedure: IRRIGATION AND DEBRIDEMENT PARTIAL WOUND CLOSURE LOCAL TISSUE TRANSFER AND FLAP ROTATION, LEFT FOOT;  Surgeon: Evelina Bucy, DPM;  Location: [place];  Service: Podiatry;  Laterality: Left;  . TRANSMETATARSAL AMPUTATION N/A 08/18/2018   Procedure: IRRIGATION AND DEBRIDEMENT OF LEFT 5TH TOE AND TRANSMETATARSAL, WITH PARTICAL LEFT 5TH TOE AND METATARSAL AMPUTATION, BONE BIOPSY, WOUND VAC APPLICATION.;  Surgeon: Evelina Bucy, DPM;  Location: [place];  Service: Podiatry;  Laterality: N/A;  . UPPER EXTREMITY VENOGRAPHY N/A 11/16/2016   Procedure: Upper Extremity Venography - Right Central;  Surgeon: Elam Dutch, MD;  Location: [place] [facility];  Service: Cardiovascular;  Laterality: N/A;  . UPPER EXTREMITY VENOGRAPHY N/A 05/25/2018   Procedure: UPPER EXTREMITY VENOGRAPHY - Bilateral;  Surgeon: Marty Heck, MD;  Location: [place] [facility];  Service: Cardiovascular;  Laterality: N/A;     Past Medical History:  Diagnosis Date  . Diabetes mellitus without complication ([place])   . Diabetic gastroparesis ([place])   . Dialysis patient ([place])   . Hypertension   . Renal disorder    Dialysis  . Sepsis ([place])     Medications:  I have reviewed the patient's current medications.  (Not in a hospital admission)   ALLERGIES:  No Known Allergies  FAM HX: Family History  Problem Relation Age of Onset  . Diabetes Mellitus II Other   . Diabetes Father   . Renal Disease Father        ESRD    Social History:   reports that he has never smoked. He has never used smokeless tobacco. He reports that he does not drink alcohol or use drugs.  ROS: 12 system ROS neg except per HPI  Blood pressure (!) 168/108, pulse 71, temperature  98.4 F (36.9 C), temperature source Oral, resp. rate 17, SpO2 99 %. PHYSICAL EXAM: Gen: lying flat in bed comfortably  Eyes: anicteric ENT: Masked  Neck: supple, JVD to mid neck flat CV: RRR, no rub Abd:  Soft, nontender Lungs: normal WOB flat, clear ant Extr: trace pedal edema LUE AVG no thrill or bruit Neuro: nonfocal Skin: warm and dry. Skin overlying AVG no erythema.   Results for orders placed or performed during the hospital encounter of 01/30/20 (from the past 48 hour(s))  CBC with Differential     Status: Abnormal   Collection Time: 01/30/20  8:22 PM  Result Value Ref Range   WBC 10.9 (H) 4.0 - 10.5 K/uL   RBC 4.82 4.22 - 5.81 MIL/uL   Hemoglobin 14.9 13.0 - 17.0 g/dL   HCT 46.4 39.0 - 52.0 %   MCV 96.3 80.0 - 100.0 fL   MCH 30.9 26.0 - 34.0 pg   MCHC 32.1 30.0 -  36.0 g/dL   RDW 12.7 11.5 - 15.5 %   Platelets 252 150 - 400 K/uL   nRBC 0.0 0.0 - 0.2 %   Neutrophils Relative % 78 %   Neutro Abs 8.6 (H) 1.7 - 7.7 K/uL   Lymphocytes Relative 10 %   Lymphs Abs 1.1 0.7 - 4.0 K/uL   Monocytes Relative 6 %   Monocytes Absolute 0.6 0.1 - 1.0 K/uL   Eosinophils Relative 5 %   Eosinophils Absolute 0.5 0.0 - 0.5 K/uL   Basophils Relative 1 %   Basophils Absolute 0.1 0.0 - 0.1 K/uL   Immature Granulocytes 0 %   Abs Immature Granulocytes 0.02 0.00 - 0.07 K/uL    Comment: Performed at [facility] [address] [place], [place] [address]  Comprehensive metabolic panel     Status: Abnormal   Collection Time: 01/30/20  8:22 PM  Result Value Ref Range   Sodium 133 (L) 135 - 145 mmol/L   Potassium 6.7 (HH) 3.5 - 5.1 mmol/L    Comment: NO VISIBLE HEMOLYSIS CRITICAL RESULT CALLED TO, READ BACK BY AND VERIFIED WITH: Selena Lesser RN [phone]    Chloride 95 (L) 98 - 111 mmol/L   CO2 23 22 - 32 mmol/L   Glucose, Bld 206 (H) 70 - 99 mg/dL    Comment: Glucose reference range applies only to samples taken after fasting for at least 8 hours.   BUN 89 (H) 6 -  20 mg/dL   Creatinine, Ser 14.87 (H) 0.61 - 1.24 mg/dL   Calcium 8.2 (L) 8.9 - 10.3 mg/dL   Total Protein 6.7 6.5 - 8.1 g/dL   Albumin 3.3 (L) 3.5 - 5.0 g/dL   AST 16 15 - 41 U/L   ALT 16 0 - 44 U/L   Alkaline Phosphatase 140 (H) 38 - 126 U/L   Total Bilirubin 0.9 0.3 - 1.2 mg/dL   GFR calc non Af Amer 4 (L) >60 mL/min   GFR calc Af Amer 4 (L) >60 mL/min   Anion gap 15 5 - 15    Comment: Performed at [facility] [address], [place], [place] [address]  CBG monitoring, ED     Status: Abnormal   Collection Time: 01/30/20 10:00 PM  Result Value Ref Range   Glucose-Capillary 180 (H) 70 - 99 mg/dL    Comment: Glucose reference range applies only to samples taken after fasting for at least 8 hours.    Dialysis Orders:  [facility] MWF 4hrs AVG 180NR BFR 400/DFR 800 2K/2Ca EDW 70 Last tx 01/26/20 - post wt 69.6 Heparin 5000u bolus Calcitriol 2.39mcg qtx Home sensipar 90 daily  Assessment/Plan  **Hyperkalemia: Medically managed now, am labs ordered and need to be collected. Will further correct with HD which is planned 1st thing after declot.  **ESRD on HD:  Clotted AVG -- order in for IR thrombectomy, pt NPO.   Plan HD 5/5 after declot per outpt orders.  He has been refusing covid test and cursing to staff about it but I discussed the imperative nature of the test on his inpatient management - he currently agrees. I informed the floor charge RN he is agreeable now.  **Diabetic foot ulcer: L heel. Following with podiatry. Cipro 400 IV qtx after HD planned x 2 weeks.  Dialysis unit aware.  **BMM:  Cont outpt calcitriol 2.25 qtx and sensipar 90 daily.  Renvela 4 TIDAC  **Nutrition:  Albumin 3.3, prostat BID ordered  **  Anemia of CKD: no ESA required.   Justin Mend 01/31/2020, 12:14 AM

## 2020-01-31 NOTE — ED Notes (Signed)
Patient told admitting Dr that he would agree to a Covid swab. When this nurse attempted to swab, pt states "I will do it myself, the last nurse let me do it myself". Pt advised that I will need to perform the procedure to perform it accurately - pt refused. States he would pray about it.

## 2020-01-31 NOTE — Progress Notes (Signed)
PROGRESS NOTE    [name]  MHD:[id number] DOB: July 31, 1981 DOA: 01/30/2020 PCP: Kerin Perna, NP      Brief Narrative:  [name] is a 39 y.o. M with DM1, gastroparesis, ESRD on HD MF (skips weds), chronic osteomyelitis of the left foot with partial amputation followed by [name], who presented for abnormal labs.    Had been dialyzing per his routine Monday, Friday schedule.  On day of admission, was told by someone that his potassium was high, so he came to ER.  In the ER, K 6.7 with ECG showing peaked T waves.  Got Ca, insulin and Lokelma.  Nephrology consulted.       Assessment & Plan:  Hyperkalemia ESRD  CLotted AV graft Per nephrology, the graft is clotted. Patient reports his nephrologist doesn't agree with his decision to dialyze only on Mondays and Wednesdays. -NPO -Consult IR for graft declotting -Consult nephrology for hemodialysis   Diabetic foot ulcer There is minimal redness surrounding.  I agree, recutrent or chronic osteomyelitis is likely.  No systemic signs, no signs of organ failure.   -Narrow to cephalexin -Follow up with Podiatry -Follow MRI foot   Diabetes Patietn refusing insulin this morning, now glucoses >300 -Check A1c -Continue SS insulin corrections  Hypertension BP elevated -Contintue PRN hydralazine        Disposition: Status is: Inpatient  Remains inpatient appropriate because:Persistent severe electrolyte disturbances K > 7, clotted graft   Dispo: The patient is from: Home              Anticipated d/c is to: Home              Anticipated d/c date is: 1 day              Patient currently is not medically stable to d/c.              MDM: This is a no charge note.  For further details, please see H&P by my partner [name] from earlier today.  The below labs and imaging reports were reviewed and summarized above.    DVT prophylaxis: jheparin Code Status: FULL Family Communication:      Consultants:   Nephrology  IR  Procedures:   MRI foot pending  IR graft procedure pending  Antimicrobials:   Vanc and Zosyn x1  Cephalexin 5/5 >>   Culture data:              Subjective: Patient has no fever, malaise, confusion, chest pain, foot pain, chills.  He is somewhat irritated at being held in the hospital.        Objective: Vitals:   01/31/20 0115 01/31/20 0342 01/31/20 0417 01/31/20 0454  BP: (!) 149/103  (!) 151/95 (!) 168/84  Pulse: 84 82 84 84  Resp: 16 17 16 18   Temp:  97.7 F (36.5 C) 98 F (36.7 C) 98.1 F (36.7 C)  TempSrc:  Oral Oral Oral  SpO2: 100% 96% 96% 98%    Intake/Output Summary (Last 24 hours) at 01/31/2020 0920 Last data filed at 01/31/2020 0530 Gross per 24 hour  Intake 240 ml  Output --  Net 240 ml   There were no vitals filed for this visit.  Examination: The patient was seen and examined.      Data Reviewed: I have personally reviewed following labs and imaging studies:  CBC: Recent Labs  Lab 01/27/20 1743 01/30/20 2022 01/31/20 0622  WBC 10.5 10.9* 8.5  NEUTROABS  7.0 8.6* 6.5  HGB 15.8 14.9 14.7  HCT 49.4 46.4 46.6  MCV 96.3 96.3 97.9  PLT 190 252 546   Basic Metabolic Panel: Recent Labs  Lab 01/27/20 1743 01/29/20 1304 01/30/20 2022  NA 136 132* 133*  K 4.8 7.1* 6.7*  CL 95* 98 95*  CO2 27 21* 23  GLUCOSE 93 163* 206*  BUN 44* 72* 89*  CREATININE 9.91* 13.00* 14.87*  CALCIUM 8.9 8.2* 8.2*   GFR: Estimated Creatinine Clearance: 6.2 mL/min (A) (by C-G formula based on SCr of 14.87 mg/dL (H)). Liver Function Tests: Recent Labs  Lab 01/27/20 1743 01/30/20 2022  AST 27 16  ALT 22 16  ALKPHOS 139* 140*  BILITOT 1.0 0.9  PROT 7.0 6.7  ALBUMIN 3.2* 3.3*   No results for input(s): LIPASE, AMYLASE in the last 168 hours. No results for input(s): AMMONIA in the last 168 hours. Coagulation Profile: No results for input(s): INR, PROTIME in the last 168 hours. Cardiac Enzymes: No  results for input(s): CKTOTAL, CKMB, CKMBINDEX, TROPONINI in the last 168 hours. BNP (last 3 results) No results for input(s): PROBNP in the last 8760 hours. HbA1C: No results for input(s): HGBA1C in the last 72 hours. CBG: Recent Labs  Lab 01/27/20 2211 01/29/20 1219 01/30/20 2200 01/31/20 0627  GLUCAP 123* 127* 180* 459*   Lipid Profile: No results for input(s): CHOL, HDL, LDLCALC, TRIG, CHOLHDL, LDLDIRECT in the last 72 hours. Thyroid Function Tests: No results for input(s): TSH, T4TOTAL, FREET4, T3FREE, THYROIDAB in the last 72 hours. Anemia Panel: No results for input(s): VITAMINB12, FOLATE, FERRITIN, TIBC, IRON, RETICCTPCT in the last 72 hours. Urine analysis:    Component Value Date/Time   COLORURINE YELLOW 05/26/2015 1115   APPEARANCEUR CLEAR 05/26/2015 1115   APPEARANCEUR Hazy 07/30/2014 2303   LABSPEC 1.016 05/26/2015 1115   LABSPEC 1.012 07/30/2014 2303   PHURINE 8.0 05/26/2015 1115   GLUCOSEU 500 (A) 05/26/2015 1115   GLUCOSEU >=500 07/30/2014 2303   HGBUR MODERATE (A) 05/26/2015 1115   BILIRUBINUR NEGATIVE 05/26/2015 1115   BILIRUBINUR Negative 07/30/2014 2303   KETONESUR 15 (A) 05/26/2015 1115   PROTEINUR >300 (A) 05/26/2015 1115   UROBILINOGEN 0.2 05/26/2015 1115   NITRITE NEGATIVE 05/26/2015 1115   LEUKOCYTESUR NEGATIVE 05/26/2015 1115   LEUKOCYTESUR Negative 07/30/2014 2303   Sepsis Labs: @LABRCNTIP (procalcitonin:4,lacticacidven:4)  )No results found for this or any previous visit (from the past 240 hour(s)).       Radiology Studies: No results found.      Scheduled Meds: . calcitRIOL  2.25 mcg Oral Q M,W,F-HD  . cephALEXin  500 mg Oral Q12H  . Chlorhexidine Gluconate Cloth  6 each Topical Q0600  . cinacalcet  90 mg Oral Q supper  . feeding supplement (PRO-STAT SUGAR FREE 64)  30 mL Oral BID  . heparin  5,000 Units Subcutaneous Q8H  . insulin aspart  0-6 Units Subcutaneous TID WC  . insulin aspart  6 Units Subcutaneous Once  .  multivitamin  1 tablet Oral QHS  . sevelamer carbonate  2,400 mg Oral BID BM  . sevelamer carbonate  3,200 mg Oral TID WC  . sodium zirconium cyclosilicate  10 g Oral TID   Continuous Infusions:    LOS: 0 days    Time spent: 15 minutes    Shane Dada, MD [facility] 01/31/2020, 9:20 AM     Please page though [facility] or Epic secure chat:  For password, contact charge nurse

## 2020-01-31 NOTE — Progress Notes (Signed)
Pt very upset and agitated during shift change, c/o of his dialysis catheter site bleeding, blood observed around the site and on the bed, pt very verbally abusive with curse words, Dr Myna Hidalgo came to see pt but pt kicked him out, demanding to see Dr Jarvis Newcomer, Also received a call from Dr Loleta Books who advised to apply pressure dressing on the bleeding site. This RN tried to calm pt down and advised him that I need to apply pressure dressing on his neck, same done with Security staff standing outside the room as pt was aggressive earlier. Hemodialysis also called to verify if pt can be dialyzed tonight as requested by Dr Loleta Books due to pt's high Potassium level, they said it can be done if pt is aggreeable, same explained to pt. He said yes, dialysis staff said they will send for him soon, pt was however reassured and will continue to monitor, pt refused V/S taken. [name], [name]

## 2020-01-31 NOTE — Progress Notes (Signed)
Was asked to assess bleeding from temporary dialysis catheter site. Catheter was placed earlier today, IR was notified of bleeding and recommended keeping head elevated.   After introducing self and explaining reason for visit, patient began yelling, cursing, and demanding that I leave the room. He did not express any specific concern or need.    He did not appear to be in any respiratory distress. Gauze over dialysis catheter site appeared clean and dry.

## 2020-01-31 NOTE — H&P (Signed)
History and Physical    [name] ZOX:[id number] DOB: [birth year] DOA: 01/30/2020  PCP: Kerin Perna, NP  Patient coming from: Home.  Chief Complaint: Elevated potassium.  HPI: [name] is a 39 y.o. male with history of diabetes mellitus type 1 with chronic gastroparesis with chronic left foot wound with admission in July 2020 for osteomyelitis of the left foot at that time patient refused amputation presents to the ER because patient was told his potassium was high around 7.1.  Patient over the last 2 weeks also noted increasing discharge from the left foot which was malodorous.  Denies any fever chills.  Also had a fall and bruised his left knee.  Denies hitting his head.  ED Course: In the ER patient repeat labs show potassium of 6.7 with EKG showing peaked T waves.  Patient was given calcium gluconate IV insulin and Lokelma.  Nephrology has been consulted plan is to get dialysis.  Patient did miss his dialysis yesterday.  In the ER labs show WBC count of 10.9 creatinine 14.8 in addition to hyperkalemia.  Patient is refusing Covid test.  Review of Systems: As per HPI, rest all negative.   Past Medical History:  Diagnosis Date  . Diabetes mellitus without complication ([place])   . Diabetic gastroparesis ([place])   . Dialysis patient ([place])   . Hypertension   . Renal disorder    Dialysis  . Sepsis [facility])     Past Surgical History:  Procedure Laterality Date  . AMPUTATION Right 02/02/2018   Procedure: RIGHT FIFTH TOE AND METATARSAL AMPUTATION. Filetted toe flap metatarsal resection. Debridement Plantar Foot wound;  Surgeon: Evelina Bucy, DPM;  Location: [place];  Service: Podiatry;  Laterality: Right;  . AMPUTATION Left 08/20/2018   Procedure: FIFTH METATARSAL BONE BIOPSY;  Surgeon: Evelina Bucy, DPM;  Location: [place];  Service: Podiatry;  Laterality: Left;  . AMPUTATION Left 10/28/2018   Procedure: LEFT GREAT TOE AMPUTATION;  Surgeon: Evelina Bucy, DPM;  Location:  [place];  Service: Podiatry;  Laterality: Left;  . APPLICATION OF WOUND VAC  02/02/2018   Procedure: APPLICATION OF WOUND VAC  Right Foot;  Surgeon: Evelina Bucy, DPM;  Location: [place];  Service: Podiatry;;  . APPLICATION OF WOUND VAC Left 10/28/2018   Procedure: APPLICATION OF WOUND VAC LEFT TOE;  Surgeon: Evelina Bucy, DPM;  Location: [place];  Service: Podiatry;  Laterality: Left;  . APPLICATION OF WOUND VAC Left 11/01/2018   Procedure: APPLICATION OF WOUND VAC;  Surgeon: Evelina Bucy, DPM;  Location: [place];  Service: Podiatry;  Laterality: Left;  . AV FISTULA PLACEMENT     left arm.  . AV FISTULA PLACEMENT Right 12/22/2016   Procedure: INSERTION OF ARTERIOVENOUS (AV) GORE-TEX GRAFT ARM;  Surgeon: Elam Dutch, MD;  Location: [facility] OR;  Service: Vascular;  Laterality: Right;  . AV FISTULA PLACEMENT Left 05/26/2018   Procedure: INSERTION OF  ARTERIOVENOUS (AV) GORE-TEX GRAFT LEFT ARM;  Surgeon: Serafina Mitchell, MD;  Location: [place];  Service: Vascular;  Laterality: Left;  . EYE SURGERY    . I & D EXTREMITY Right 10/31/2017   Procedure: IRRIGATION AND DEBRIDEMENT RIGHT FOOT;  Surgeon: Evelina Bucy, DPM;  Location: [place];  Service: Podiatry;  Laterality: Right;  . I & D EXTREMITY Left 08/20/2018   Procedure: IRRIGATION AND DEBRIDEMENT EXTREMITY WITH SECONDARY WOUND CLOSUREAND APPLICATION OF WOUND VAC LEFT FOOT;  Surgeon: Evelina Bucy, DPM;  Location: [name]  OR;  Service: Podiatry;  Laterality: Left;  . I & D EXTREMITY Left 10/20/2018   Procedure: IRRIGATION AND DEBRIDEMENT LEFT FOOT  DEBRIDEMENT LATERAL FOOT WOUND;  Surgeon: Evelina Bucy, DPM;  Location: [place];  Service: Podiatry;  Laterality: Left;  . I & D EXTREMITY Left 10/28/2018   Procedure: IRRIGATION AND DEBRIDEMENT LEFT TOE;  Surgeon: Evelina Bucy, DPM;  Location: [place];  Service: Podiatry;  Laterality: Left;  . INSERTION OF DIALYSIS CATHETER     Right subclavian  . IR AV DIALY SHUNT INTRO NEEDLE/INTRACATH INITIAL W/PTA/IMG  RIGHT Right 02/05/2018  . IR THROMBECTOMY AV FISTULA W/THROMBOLYSIS/PTA INC/SHUNT/IMG LEFT Left 08/24/2018  . IR THROMBECTOMY AV FISTULA W/THROMBOLYSIS/PTA INC/SHUNT/IMG LEFT Left 01/06/2019  . IR US GUIDE VASC ACCESS LEFT  08/24/2018  . IR US GUIDE VASC ACCESS RIGHT  02/05/2018  . IRRIGATION AND DEBRIDEMENT FOOT Right 10/23/2018   Procedure: Irrigation and Debridement to tendon, Left Foot;  Surgeon: Evelina Bucy, DPM;  Location: [place];  Service: Podiatry;  Laterality: Right;  . IRRIGATION AND DEBRIDEMENT FOOT Left 11/01/2018   Procedure: IRRIGATION AND DEBRIDEMENT PARTIAL WOUND CLOSURE LOCAL TISSUE TRANSFER AND FLAP ROTATION, LEFT FOOT;  Surgeon: Evelina Bucy, DPM;  Location: [place];  Service: Podiatry;  Laterality: Left;  . TRANSMETATARSAL AMPUTATION N/A 08/18/2018   Procedure: IRRIGATION AND DEBRIDEMENT OF LEFT 5TH TOE AND TRANSMETATARSAL, WITH PARTICAL LEFT 5TH TOE AND METATARSAL AMPUTATION, BONE BIOPSY, WOUND VAC APPLICATION.;  Surgeon: Evelina Bucy, DPM;  Location: [place];  Service: Podiatry;  Laterality: N/A;  . UPPER EXTREMITY VENOGRAPHY N/A 11/16/2016   Procedure: Upper Extremity Venography - Right Central;  Surgeon: Elam Dutch, MD;  Location: [place] CV LAB;  Service: Cardiovascular;  Laterality: N/A;  . UPPER EXTREMITY VENOGRAPHY N/A 05/25/2018   Procedure: UPPER EXTREMITY VENOGRAPHY - Bilateral;  Surgeon: Marty Heck, MD;  Location: [place] CV LAB;  Service: Cardiovascular;  Laterality: N/A;     reports that he has never smoked. He has never used smokeless tobacco. He reports that he does not drink alcohol or use drugs.  No Known Allergies  Family History  Problem Relation Age of Onset  . Diabetes Mellitus II Other   . Diabetes Father   . Renal Disease Father        ESRD    Prior to Admission medications   Medication Sig Start Date End Date Taking? Authorizing Provider  NOVOLOG 100 UNIT/ML injection INJECT 6 TO 7 UNITS SUBCUTANEOUSLY TWICE DAILY  WITH A MEAL Patient taking differently: Inject 7 Units into the skin 3 (three) times daily with meals.  11/10/19  Yes Kerin Perna, NP  sevelamer carbonate (RENVELA) 800 MG tablet Take 2,400-4,000 mg by mouth See admin instructions. Take 5 tablets with meals then take 3 tablets with snacks 04/27/19  Yes [provider]  calcitRIOL (ROCALTROL) 0.5 MCG capsule Take 5 capsules (2.5 mcg total) by mouth every Monday, Wednesday, and Friday with hemodialysis. Patient not taking: Reported on 01/30/2020 10/26/18   Modena Jansky, MD  ciprofloxacin (CIPRO) 500 MG tablet Take 1 tablet (500 mg total) by mouth daily with breakfast for 7 days. 01/27/20 02/03/20  Khatri, Hina, PA-C  oxycodone (OXY-IR) 5 MG capsule Take 1 capsule (5 mg total) by mouth every 6 (six) hours as needed. 01/27/20   Khatri, Hina, PA-C  sulfamethoxazole-trimethoprim (BACTRIM) 400-80 MG tablet Take 1 tablet by mouth 2 (two) times daily. Patient not taking: Reported on 01/30/2020 12/12/19   Landis Martins,  DPM  Vancomycin (VANCOCIN) 750-5 MG/150ML-% SOLN Inject 150 mLs (750 mg total) into the vein every Monday, Wednesday, and Friday with hemodialysis. Patient not taking: Reported on 01/30/2020 04/28/19   Earlene Plater, MD    Physical Exam: Constitutional: Moderately built and nourished. Vitals:   01/30/20 2245 01/30/20 2300 01/30/20 2315 01/31/20 0115  BP: (!) 148/103 (!) 146/100 (!) 168/108 (!) 149/103  Pulse: 71 73 71 84  Resp:  19 17 16   Temp:      TempSrc:      SpO2: 100% 100% 99% 100%   Eyes: Anicteric no pallor. ENMT: No discharge from the ears eyes nose or mouth. Neck: No mass felt.  No neck rigidity. Respiratory: No rhonchi or crepitations. Cardiovascular: S1-S2 heard. Abdomen: Soft nontender bowel sounds present. Musculoskeletal: Left foot has lateral aspect ulceration with some discharge. Skin: Lateral aspect of the left foot has ulcerations.  With discharge. Neurologic: Alert awake oriented time place and  person.  Moves all extremities. Psychiatric: Appears normal.   Labs on Admission: I have personally reviewed following labs and imaging studies  CBC: Recent Labs  Lab 01/27/20 1743 01/30/20 2022  WBC 10.5 10.9*  NEUTROABS 7.0 8.6*  HGB 15.8 14.9  HCT 49.4 46.4  MCV 96.3 96.3  PLT 190 474   Basic Metabolic Panel: Recent Labs  Lab 01/27/20 1743 01/29/20 1304 01/30/20 2022  NA 136 132* 133*  K 4.8 7.1* 6.7*  CL 95* 98 95*  CO2 27 21* 23  GLUCOSE 93 163* 206*  BUN 44* 72* 89*  CREATININE 9.91* 13.00* 14.87*  CALCIUM 8.9 8.2* 8.2*   GFR: Estimated Creatinine Clearance: 6.2 mL/min (A) (by C-G formula based on SCr of 14.87 mg/dL (H)). Liver Function Tests: Recent Labs  Lab 01/27/20 1743 01/30/20 2022  AST 27 16  ALT 22 16  ALKPHOS 139* 140*  BILITOT 1.0 0.9  PROT 7.0 6.7  ALBUMIN 3.2* 3.3*   No results for input(s): LIPASE, AMYLASE in the last 168 hours. No results for input(s): AMMONIA in the last 168 hours. Coagulation Profile: No results for input(s): INR, PROTIME in the last 168 hours. Cardiac Enzymes: No results for input(s): CKTOTAL, CKMB, CKMBINDEX, TROPONINI in the last 168 hours. BNP (last 3 results) No results for input(s): PROBNP in the last 8760 hours. HbA1C: No results for input(s): HGBA1C in the last 72 hours. CBG: Recent Labs  Lab 01/27/20 2211 01/29/20 1219 01/30/20 2200  GLUCAP 123* 127* 180*   Lipid Profile: No results for input(s): CHOL, HDL, LDLCALC, TRIG, CHOLHDL, LDLDIRECT in the last 72 hours. Thyroid Function Tests: No results for input(s): TSH, T4TOTAL, FREET4, T3FREE, THYROIDAB in the last 72 hours. Anemia Panel: No results for input(s): VITAMINB12, FOLATE, FERRITIN, TIBC, IRON, RETICCTPCT in the last 72 hours. Urine analysis:    Component Value Date/Time   COLORURINE YELLOW 05/26/2015 1115   APPEARANCEUR CLEAR 05/26/2015 1115   APPEARANCEUR Hazy 07/30/2014 2303   LABSPEC 1.016 05/26/2015 1115   LABSPEC 1.012 07/30/2014  2303   PHURINE 8.0 05/26/2015 1115   GLUCOSEU 500 (A) 05/26/2015 1115   GLUCOSEU >=500 07/30/2014 2303   HGBUR MODERATE (A) 05/26/2015 1115   BILIRUBINUR NEGATIVE 05/26/2015 1115   BILIRUBINUR Negative 07/30/2014 2303   KETONESUR 15 (A) 05/26/2015 1115   PROTEINUR >300 (A) 05/26/2015 1115   UROBILINOGEN 0.2 05/26/2015 1115   NITRITE NEGATIVE 05/26/2015 1115   LEUKOCYTESUR NEGATIVE 05/26/2015 1115   LEUKOCYTESUR Negative 07/30/2014 2303   Sepsis Labs: @LABRCNTIP (procalcitonin:4,lacticidven:4) )No results found for this or  any previous visit (from the past 240 hour(s)).   Radiological Exams on Admission: No results found.  EKG: Independently reviewed.  Normal sinus rhythm with peaked T waves.  Assessment/Plan Principal Problem:   Cellulitis of left foot Active Problems:   ESRD on dialysis ([name])   Hyperkalemia   Cellulitis    1. ESRD on hemodialysis with hyperkalemia for which patient's potassium was treated medically with IV insulin calcium gluconate and Lokelma.  Nephrology has been consulted for dialysis. 2. Left foot cellulitis with possible osteomyelitis.  Since patient has had osteomyelitis in July 2020 which is a concerning for recurrence.  I have ordered MRI of the left foot.  Patient had recently followed with podiatry and at that time was recommended to have IV Cipro for 2 weeks.  Presently I placed patient on Vanco and Zosyn until we get MRI. 3. Elevated blood pressure readings -we'll closely monitor blood pressure trends I think will improve with dialysis.  We'll keep patient on as needed IV hydralazine. 4. History of gastroparesis. 5. Diabetes mellitus type 1 on insulin sliding scale.  Takes 7 units of NovoLog premeals.  Patient has refused Covid test.  Since patient has likely possibility of osteomyelitis and has presently hyperkalemia will need close monitoring for any further deterioration in inpatient status.   DVT prophylaxis: Heparin. Code Status: Full  code. Family Communication: Discussed with patient. Disposition Plan: Home. Consults called: Nephrology. Admission status: Inpatient.   Rise Patience MD [facility] Pager [phone].  If 7PM-7AM, please contact night-coverage [email or web address] Password [email or web address]  01/31/2020, 3:17 AM

## 2020-01-31 NOTE — Progress Notes (Addendum)
Called back to room due to patient request.  Patient verbally abusive, cursing, angry but without explicit threats of violence.  His complaint seemed to center on perceived mistreatment by Radiology (placing CV line without anesthesia) and poor communication (rather, he perseverated that he had been "lied to", which I interpret to mean he was told one plan which turned out not to be carried out).  Patient is unclear about the various roles of Radiology, Nephrology, and myself and it is not clear that he recognizes me from earlier today.    Regarding the temp cath placement, I believe that this is not typically done with sedation, and the amount of bleeding that I can see (patient would not allow me to examine the neck) is expected and normal.  Regarding the plan of care, the patient appeared to have a reasonable understanding of this this morning (he was sent to the hospital for urgent dialysis due to K >7, his dialysis was delayed due to clotted graft needing IR thrombectomy, and so interim dialysis needed to be carried out by temp cath).  I do not understand at this time where the misunderstanding lies.  It was reiterated to the patient that he has a life-threatening condition and needs urgent dialysis, which is available now, and that failure to adhere to recommended treatment could result in death.  He expressed understanding and that his family by phone had advised him of the same.  It is noted that he has refused all doses of the recommended temporizing measures for his life-threatening hyperkalemia.    I advise that security round on floor.  I have contacted nursing leadership for the unit for further advice tonight.

## 2020-01-31 NOTE — Progress Notes (Signed)
Patient s/p guide CV line placement, pt extremity upset regarding the procedure as pt was not given sedation. Pt refused assessment, VS and CBG. Patient requesting to talk to primary doctor and Dr. Jarvis Newcomer.

## 2020-01-31 NOTE — ED Provider Notes (Signed)
Ultrasound ED Peripheral IV (Provider)  Date/Time: 01/31/2020 1:08 AM Performed by: Fatima Blank, MD Authorized by: Fatima Blank, MD   Procedure details:    Indications: multiple failed IV attempts and poor IV access     Skin Prep: chlorhexidine gluconate     Location:  Right AC   Angiocath:  20 G   Bedside Ultrasound Guided: Yes     Images: archived     Patient tolerated procedure without complications: Yes     Dressing applied: Yes     Lost IV access. Needs access to temporize for hyperK+. Placed with [place] noted above.     Fatima Blank, MD 01/31/20 [phone]

## 2020-01-31 NOTE — Telephone Encounter (Signed)
Faxed required form, clinical received 01/31/2020 for 01/30/2020 and demographics to [place].

## 2020-01-31 NOTE — ED Notes (Signed)
Pt's IV infiltrated; Md notified and will attempt IV ultrasound.

## 2020-02-01 ENCOUNTER — Telehealth: Payer: Self-pay | Admitting: Podiatry

## 2020-02-01 LAB — RENAL FUNCTION PANEL
Albumin: 3 g/dL — ABNORMAL LOW (ref 3.5–5.0)
Anion gap: 18 — ABNORMAL HIGH (ref 5–15)
BUN: 48 mg/dL — ABNORMAL HIGH (ref 6–20)
CO2: 18 mmol/L — ABNORMAL LOW (ref 22–32)
Calcium: 7.6 mg/dL — ABNORMAL LOW (ref 8.9–10.3)
Chloride: 94 mmol/L — ABNORMAL LOW (ref 98–111)
Creatinine, Ser: 10.14 mg/dL — ABNORMAL HIGH (ref 0.61–1.24)
GFR calc Af Amer: 7 mL/min — ABNORMAL LOW (ref >60)
GFR calc non Af Amer: 6 mL/min — ABNORMAL LOW (ref >60)
Glucose, Bld: 351 mg/dL — ABNORMAL HIGH (ref 70–99)
Phosphorus: 3.6 mg/dL (ref 2.5–4.6)
Potassium: 4.8 mmol/L (ref 3.5–5.1)
Sodium: 130 mmol/L — ABNORMAL LOW (ref 135–145)

## 2020-02-01 LAB — CBC
HCT: 44.7 % (ref 39.0–52.0)
Hemoglobin: 14.7 g/dL (ref 13.0–17.0)
MCH: 31.5 pg (ref 26.0–34.0)
MCHC: 32.9 g/dL (ref 30.0–36.0)
MCV: 95.7 fL (ref 80.0–100.0)
Platelets: 178 10*3/uL (ref 150–400)
RBC: 4.67 MIL/uL (ref 4.22–5.81)
RDW: 12.9 % (ref 11.5–15.5)
WBC: 9.5 10*3/uL (ref 4.0–10.5)
nRBC: 0 % (ref 0.0–0.2)

## 2020-02-01 MED ORDER — CIPROFLOXACIN HCL 500 MG PO TABS
500.0000 mg | ORAL_TABLET | Freq: Every day | ORAL | 0 refills | Status: DC
Start: 2020-02-01 — End: 2020-02-18

## 2020-02-01 MED ORDER — OXYCODONE HCL 5 MG PO CAPS: 5.0000 mg | ORAL_CAPSULE | Freq: Two times a day (BID) | ORAL | 0 refills | Status: DC | PRN

## 2020-02-01 MED FILL — CIPROFLOXACIN HCL 500 MG TA: 500 | 14 days supply | Qty: 14 | Fill #0

## 2020-02-01 MED FILL — OXYCODONE HCL 5 MG TABS: 5 | 2 days supply | Qty: 4 | Fill #0

## 2020-02-01 NOTE — Telephone Encounter (Signed)
Tried calling back twice - the VM is a different name the number might be incorrect. I sent a secure chat message to Dr. Loleta Books to call me with my personal cell if needed.

## 2020-02-01 NOTE — Telephone Encounter (Signed)
Dr.Price the Dr.Danford from the hospital wanted you to be aware that he will be releasing [name] todayhe said that his foot ulcer was looking a little infected I did state that PT has an appt 02/02/20 Dr.Danford home with keflec unless you wanted something different. Dr.Danford can be reached @ [phone] please assist

## 2020-02-01 NOTE — Telephone Encounter (Signed)
[facility] - A. Andgraves and told her the orders had been received 01/31/2020 at the [phone]. AGeanie Kenning states she will discuss with the head office, but if [facility] did not accept him, she suggested contact [place].

## 2020-02-01 NOTE — Telephone Encounter (Signed)
Dr.Danford called for Dr.Price im re submitting to put Sindy Messing in on the messages

## 2020-02-01 NOTE — Progress Notes (Signed)
[facility] Progress Note  Subjective: had temp cath and HD last night, some bleeding around temp cath, now has stopped  Vitals:   02/01/20 0100 02/01/20 0130 02/01/20 0200 02/01/20 0500  BP: 136/84 (!) 145/89 (!) 162/87 (!) 144/78  Pulse: 86 84 79 88  Resp: 20 20 20 19   Temp:   98.3 F (36.8 C) 98.4 F (36.9 C)  TempSrc:   Oral Oral  SpO2:   100% 100%  Weight:   70.3 kg     Exam: Gen alert, no distress Chest clear bilat to bases  RRR no MRG Abd soft ntnd no mass or ascites +bs GU normal male  MS no joint effusions or deformity Ext no LE edema, large L lateral foot wound/ ulcer w/ eschar, no odor Neuro is alert, Ox 3 , nf LUA AVG no bruit    Home meds:  - novolog 7u ac/ rocaltrol 2.5 ug mwf/ cipro x 7d/ bactrim bid/ renvela ac     Outpt HD: MWF [facility]   4h  400/800   70kg   2/2 bath  Hep 5000+ 5049midrun  - calc 2.25 ug tiw   Assessment: 1. Clotted AVG - has appt a 1pm as outpt access center, needs to remain NPO until 1pm given his gastroparesis as he will receive sedation.  2. Hyperkalemia - resolved sp HD last night.  3. ESRD - usual HD MWF. Next HD tomorrow as OP. Needs temp cath removal this am prior to dc.  4. Type 1 DM - per primary 5. Anemia ckd - Hb > 11  Plan - as above     Rob Jannett Schmall 02/01/2020, 8:44 AM   Recent Labs  Lab 01/30/20 2022 01/30/20 2022 01/31/20 0622 02/01/20 0436  K 6.7*  --   --  4.8  BUN 89*  --   --  48*  CREATININE 14.87*  --   --  10.14*  CALCIUM 8.2*  --   --  7.6*  PHOS  --   --   --  3.6  HGB 14.9   < > 14.7 14.7   < > = values in this interval not displayed.   Inpatient medications: . bacitracin  1 application Topical BID  . calcitRIOL  2.25 mcg Oral Q M,W,F-HD  . cephALEXin  500 mg Oral Q12H  . Chlorhexidine Gluconate Cloth  6 each Topical Q0600  . cinacalcet  90 mg Oral Q supper  . feeding supplement (PRO-STAT SUGAR FREE 64)  30 mL Oral BID  . insulin aspart  0-6 Units Subcutaneous TID  WC  . insulin aspart  6 Units Subcutaneous Once  . LORazepam  1 mg Intravenous Once  . multivitamin  1 tablet Oral QHS  . sevelamer carbonate  2,400 mg Oral BID BM  . sevelamer carbonate  3,200 mg Oral TID WC    acetaminophen, hydrALAZINE, HYDROmorphone, lidocaine (PF), ondansetron (ZOFRAN) IV

## 2020-02-01 NOTE — Telephone Encounter (Signed)
Pt called stating Home Health has not recvd orders yet.  I advised they were faxed yesterday.  Pt is requesting nurse [name] be contacted [phone].

## 2020-02-01 NOTE — Discharge Summary (Signed)
Physician Discharge Summary  [name] FWY:[id number] DOB: [birth year] DOA: 01/30/2020  PCP: Kerin Perna, NP  Admit date: 01/30/2020 Discharge date: 02/01/2020  Admitted From: Home  Disposition: Home   Recommendations for Outpatient Follow-up:  1. Follow up with Podiatry tomorrow Follow up with HD center as prescribed     Home Health: None  Equipment/Devices: None  Discharge Condition: Fair  CODE STATUS: FULL Diet recommendation: Renal, diabetic  Brief/Interim Summary: [name] is a 39 y.o. M with DM1, gastroparesis, ESRD on HD MF (skips weds), chronic osteomyelitis of the left foot with partial amputation followed by Dr. March Rummage, who presented for abnormal labs.    Had been dialyzing per his routine Monday, Friday schedule.  On day of admission, was told by someone that his potassium was high, so he came to ER.  In the ER, K 6.7 with ECG showing peaked T waves.  Got Ca, insulin and Lokelma.  Nephrology consulted.     PRINCIPAL HOSPITAL DIAGNOSIS: Hyperkalemia    Discharge Diagnoses:  Hyperkalemia ESRD  Clotted AV graft Patient presented with K>7, clotted graft.  IR were consulted for temp cath placement and the patient underwent dialysis.  Patient refused most medical therapies during hospital stay, but was amenable to dialysis treatment.  After dialysis, outpatient declotting arranged and the patient was discharged.     Diabetic foot ulcer There is minimal redness surrounding.  MRI obtained that showed no osteomyelitis.  He was placed on ciprofloxacin 2 weeks, as prescribed by Podiatry.  Discussed with Dr. March Rummage.  He has close follow up tomorrow.    Diabetes  Hypertension          Discharge Instructions  Discharge Instructions    Discharge instructions   Complete by: As directed    Take Cipro 500 mg once daily for 2 weeks  Go see the Podiatrist Dr. March Rummage tomorrow at your previously scheduled appointment  Go to dialysis every Monday  Wednesday and Friday.  Anything less than this will make your health worse and risk your life   Increase activity slowly   Complete by: As directed      Allergies as of 02/01/2020   No Known Allergies     Medication List    STOP taking these medications   sulfamethoxazole-trimethoprim 400-80 MG tablet Commonly known as: Bactrim   Vancomycin 750-5 MG/150ML-% Soln Commonly known as: VANCOCIN     TAKE these medications   calcitRIOL 0.5 MCG capsule Commonly known as: ROCALTROL Take 5 capsules (2.5 mcg total) by mouth every Monday, Wednesday, and Friday with hemodialysis.   ciprofloxacin 500 MG tablet Commonly known as: CIPRO Take 1 tablet (500 mg total) by mouth daily with breakfast for 14 days.   NovoLOG 100 UNIT/ML injection Generic drug: insulin aspart INJECT 6 TO 7 UNITS SUBCUTANEOUSLY TWICE DAILY WITH A MEAL What changed: See the new instructions.   oxycodone 5 MG capsule Commonly known as: OXY-IR Take 1 capsule (5 mg total) by mouth every 6 (six) hours as needed.   sevelamer carbonate 800 MG tablet Commonly known as: RENVELA Take 2,400-4,000 mg by mouth See admin instructions. Take 5 tablets with meals then take 3 tablets with snacks      Follow-up Information    Evelina Bucy, DPM. Go in 1 day(s).   Specialty: Podiatry Why: Go to appointment tomorrow Contact information: [place] [address] [phone]          No Known Allergies  Consultations:  Nephrology  IR   Procedures/Studies: MR FOOT LEFT WO CONTRAST  Result Date: 01/31/2020 CLINICAL DATA:  Plantar lateral hindfoot ulcer near the ankle EXAM: MRI OF THE LEFT FOOT WITHOUT CONTRAST TECHNIQUE: Multiplanar, multisequence MR imaging of the left hindfoot/ankle was performed. No intravenous contrast was administered. Axial and coronal T2 fat saturated sequences and sagittal STIR sequences were performed. Evaluation was extremely limited by patient cooperation throughout the  exam. Patient requested early termination of the exam. COMPARISON:  01/27/2020 FINDINGS: Bones/Joint/Cartilage Evaluation is extremely limited by patient motion as well as limited nature of the acquired sequences. Mild marrow edema is seen within the cuboid, middle and lateral cuneiform, and second through fourth metatarsals. Ligaments Not adequately evaluated given limitations of the exam. Muscles and Tendons Not adequately evaluated given limitations of the exam. Soft tissues There is subcutaneous edema involving the plantar lateral aspect of the midfoot, at the site of soft tissue ulceration. There is no underlying fluid collection or evidence of abscess. IMPRESSION: 1. Soft tissue edema corresponding to the soft tissue ulceration plantar lateral midfoot. No fluid collection or abscess. 2. Mild edema within the tarsals and metatarsals as described above, likely degenerative and reactive. No definite signs of osteomyelitis based on limited evaluation as above. Electronically Signed   By: Randa Ngo M.D.   On: 01/31/2020 18:16   IR Fluoro Guide CV Line Right  Result Date: 01/31/2020 CLINICAL DATA:  End-stage renal disease on hemodialysis. Occluded shunt with elevated potassium. Temporary hemodialysis access required. EXAM: EXAM RIGHT IJ CATHETER PLACEMENT UNDER ULTRASOUND AND FLUOROSCOPIC GUIDANCE TECHNIQUE: The procedure, risks (including but not limited to bleeding, infection, organ damage, pneumothorax), benefits, and alternatives were explained to the patient. Questions regarding the procedure were encouraged and answered. The patient understands and consents to the procedure. Patency of the right IJ vein was confirmed with ultrasound with image documentation. An appropriate skin site was determined. Skin site was marked. Region was prepped using maximum barrier technique including cap and mask, sterile gown, sterile gloves, large sterile sheet, and Chlorhexidine as cutaneous antisepsis. The region was  infiltrated locally with 1% lidocaine. Under real-time ultrasound guidance, the right IJ vein was accessed with a 21 gauge needle; the needle tip within the vein was confirmed with ultrasound image documentation. The needle exchanged over a 018 guidewire for vascular dilator. The guidewire would not advance into the SVC. 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 With the aid of an angled Glidewire, access across the SVC and right atrium into the IVC was achieved. Vascular dilators were passed which allowed advancement of a 15 cm Mahurkar catheter. This was positioned with the tip at the cavoatrial junction. Spot chest radiograph shows good positioning and no pneumothorax. Catheter was flushed and sutured externally with 0-Prolene sutures. Patient tolerated the procedure well. FLUOROSCOPY TIME:  1 minutes; 84 uGym2 DAP COMPLICATIONS: COMPLICATIONS none IMPRESSION: 1. Technically successful right IJ Mahurkar catheter placement. Electronically Signed   By: Lucrezia Europe M.D.   On: 01/31/2020 17:54   DG Foot Complete Left  Result Date: 01/27/2020 CLINICAL DATA:  Infection. EXAM: LEFT FOOT - COMPLETE 3+ VIEW COMPARISON:  April 24, 2019 FINDINGS: The patient is status post prior amputation of the fifth digit. The patient is status post prior partial amputation of the first digit through the first metatarsal. There are age-indeterminate erosions through the remaining portions of the first metatarsal. There are advanced degenerative changes at the second metatarsophalangeal joint. Vascular calcifications are noted. There is soft tissue  swelling about the foot. There is an ulcer at the lateral aspect of the foot at the level of the base of the remaining fifth metatarsal. There are no obvious erosions. There are degenerative changes of the midfoot. There are progressive erosions of the sesamoid bones of the first digit. IMPRESSION: 1. No acute displaced fracture or  dislocation. 2. Progressive erosions involving the first metatarsal and sesamoid bones concerning for osteomyelitis. 3. Status post prior amputation of the fifth digit. 4. Ulcer involving the lateral aspect of the foot. 5. Nonspecific soft tissue swelling is noted. Electronically Signed   By: Constance Holster M.D.   On: 01/27/2020 21:50       Subjective: Patient without pain, confusion, malaise.  Feeling better this morning.  Discharge Exam: Vitals:   02/01/20 0200 02/01/20 0500  BP: (!) 162/87 (!) 144/78  Pulse: 79 88  Resp: 20 19  Temp: 98.3 F (36.8 C) 98.4 F (36.9 C)  SpO2: 100% 100%   Vitals:   02/01/20 0100 02/01/20 0130 02/01/20 0200 02/01/20 0500  BP: 136/84 (!) 145/89 (!) 162/87 (!) 144/78  Pulse: 86 84 79 88  Resp: 20 20 20 19   Temp:   98.3 F (36.8 C) 98.4 F (36.9 C)  TempSrc:   Oral Oral  SpO2:   100% 100%  Weight:   70.3 kg     General: Pt is alert, awake, not in acute distress, sleeping, interactive. Cardiovascular: RRR, nl S1-S2, no murmurs appreciated.   No LE edema.  The left foot ulcer has mild redness around it, no significant infection. Respiratory: Normal respiratory rate and rhythm.  CTAB without rales or wheezes. Abdominal: Abdomen soft and non-tender.  No distension or HSM.   Neuro/Psych: Strength symmetric in upper and lower extremities.  Judgment and insight appear normal.   The results of significant diagnostics from this hospitalization (including imaging, microbiology, ancillary and laboratory) are listed below for reference.     Microbiology: Recent Results (from the past 240 hour(s))  Respiratory Panel by RT PCR (Flu A&B, Covid) - Nasopharyngeal Swab     Status: None   Collection Time: 01/31/20  9:10 AM   Specimen: Nasopharyngeal Swab  Result Value Ref Range Status   SARS Coronavirus 2 by RT PCR NEGATIVE NEGATIVE Final    Comment: (NOTE) SARS-CoV-2 target nucleic acids are NOT DETECTED. The SARS-CoV-2 RNA is generally detectable  in upper respiratoy specimens during the acute phase of infection. The lowest concentration of SARS-CoV-2 viral copies this assay can detect is 131 copies/mL. A negative result does not preclude SARS-Cov-2 infection and should not be used as the sole basis for treatment or other patient management decisions. A negative result may occur with  improper specimen collection/handling, submission of specimen other than nasopharyngeal swab, presence of viral mutation(s) within the areas targeted by this assay, and inadequate number of viral copies (<131 copies/mL). A negative result must be combined with clinical observations, patient history, and epidemiological information. The expected result is Negative. Fact Sheet for Patients:  PinkCheek.be Fact Sheet for Healthcare Providers:  GravelBags.it This test is not yet ap proved or cleared by the [place] FDA and  has been authorized for detection and/or diagnosis of SARS-CoV-2 by FDA under an Emergency Use Authorization (EUA). This EUA will remain  in effect (meaning this test can be used) for the duration of the COVID-19 declaration under Section 564(b)(1) of the Act, 21 U.S.C. section 360bbb-3(b)(1), unless the authorization is terminated or revoked sooner.    Influenza A  by PCR NEGATIVE NEGATIVE Final   Influenza B by PCR NEGATIVE NEGATIVE Final    Comment: (NOTE) The Xpert Xpress SARS-CoV-2/FLU/RSV assay is intended as an aid in  the diagnosis of influenza from Nasopharyngeal swab specimens and  should not be used as a sole basis for treatment. Nasal washings and  aspirates are unacceptable for Xpert Xpress SARS-CoV-2/FLU/RSV  testing. Fact Sheet for Patients: PinkCheek.be Fact Sheet for Healthcare Providers: GravelBags.it This test is not yet approved or cleared by the [place] FDA and  has been authorized for  detection and/or diagnosis of SARS-CoV-2 by  FDA under an Emergency Use Authorization (EUA). This EUA will remain  in effect (meaning this test can be used) for the duration of the  Covid-19 declaration under Section 564(b)(1) of the Act, 21  U.S.C. section 360bbb-3(b)(1), unless the authorization is  terminated or revoked. Performed at [facility], [place] [address] [place], [place] [address]      Labs: BNP (last 3 results) No results for input(s): BNP in the last 8760 hours. Basic Metabolic Panel: Recent Labs  Lab 01/27/20 1743 01/29/20 1304 01/30/20 2022 02/01/20 0436  NA 136 132* 133* 130*  K 4.8 7.1* 6.7* 4.8  CL 95* 98 95* 94*  CO2 27 21* 23 18*  GLUCOSE 93 163* 206* 351*  BUN 44* 72* 89* 48*  CREATININE 9.91* 13.00* 14.87* 10.14*  CALCIUM 8.9 8.2* 8.2* 7.6*  PHOS  --   --   --  3.6   Liver Function Tests: Recent Labs  Lab 01/27/20 1743 01/30/20 2022 02/01/20 0436  AST 27 16  --   ALT 22 16  --   ALKPHOS 139* 140*  --   BILITOT 1.0 0.9  --   PROT 7.0 6.7  --   ALBUMIN 3.2* 3.3* 3.0*   No results for input(s): LIPASE, AMYLASE in the last 168 hours. No results for input(s): AMMONIA in the last 168 hours. CBC: Recent Labs  Lab 01/27/20 1743 01/30/20 2022 01/31/20 0622 02/01/20 0436  WBC 10.5 10.9* 8.5 9.5  NEUTROABS 7.0 8.6* 6.5  --   HGB 15.8 14.9 14.7 14.7  HCT 49.4 46.4 46.6 44.7  MCV 96.3 96.3 97.9 95.7  PLT 190 252 205 178   Cardiac Enzymes: No results for input(s): CKTOTAL, CKMB, CKMBINDEX, TROPONINI in the last 168 hours. BNP: Invalid input(s): POCBNP CBG: Recent Labs  Lab 01/30/20 2200 01/31/20 0627 01/31/20 1005 01/31/20 1230 01/31/20 1927  GLUCAP 180* 459* 167* 164* 304*   D-Dimer No results for input(s): DDIMER in the last 72 hours. Hgb A1c Recent Labs    01/31/20 0622  HGBA1C 8.9*   Lipid Profile No results for input(s): CHOL, HDL, LDLCALC, TRIG, CHOLHDL, LDLDIRECT in the last 72 hours. Thyroid function  studies No results for input(s): TSH, T4TOTAL, T3FREE, THYROIDAB in the last 72 hours.  Invalid input(s): FREET3 Anemia work up No results for input(s): VITAMINB12, FOLATE, FERRITIN, TIBC, IRON, RETICCTPCT in the last 72 hours. Urinalysis    Component Value Date/Time   COLORURINE YELLOW 05/26/2015 1115   APPEARANCEUR CLEAR 05/26/2015 1115   APPEARANCEUR Hazy 07/30/2014 2303   LABSPEC 1.016 05/26/2015 1115   LABSPEC 1.012 07/30/2014 2303   PHURINE 8.0 05/26/2015 1115   GLUCOSEU 500 (A) 05/26/2015 1115   GLUCOSEU >=500 07/30/2014 2303   HGBUR MODERATE (A) 05/26/2015 1115   BILIRUBINUR NEGATIVE 05/26/2015 1115   BILIRUBINUR Negative 07/30/2014 2303   KETONESUR 15 (A) 05/26/2015 1115   PROTEINUR >300 (A) 05/26/2015  1115   UROBILINOGEN 0.2 05/26/2015 1115   NITRITE NEGATIVE 05/26/2015 1115   LEUKOCYTESUR NEGATIVE 05/26/2015 1115   LEUKOCYTESUR Negative 07/30/2014 2303   Sepsis Labs Invalid input(s): PROCALCITONIN,  WBC,  LACTICIDVEN Microbiology Recent Results (from the past 240 hour(s))  Respiratory Panel by RT PCR (Flu A&B, Covid) - Nasopharyngeal Swab     Status: None   Collection Time: 01/31/20  9:10 AM   Specimen: Nasopharyngeal Swab  Result Value Ref Range Status   SARS Coronavirus 2 by RT PCR NEGATIVE NEGATIVE Final    Comment: (NOTE) SARS-CoV-2 target nucleic acids are NOT DETECTED. The SARS-CoV-2 RNA is generally detectable in upper respiratoy specimens during the acute phase of infection. The lowest concentration of SARS-CoV-2 viral copies this assay can detect is 131 copies/mL. A negative result does not preclude SARS-Cov-2 infection and should not be used as the sole basis for treatment or other patient management decisions. A negative result may occur with  improper specimen collection/handling, submission of specimen other than nasopharyngeal swab, presence of viral mutation(s) within the areas targeted by this assay, and inadequate number of viral copies (<131  copies/mL). A negative result must be combined with clinical observations, patient history, and epidemiological information. The expected result is Negative. Fact Sheet for Patients:  PinkCheek.be Fact Sheet for Healthcare Providers:  GravelBags.it This test is not yet ap proved or cleared by the [place] FDA and  has been authorized for detection and/or diagnosis of SARS-CoV-2 by FDA under an Emergency Use Authorization (EUA). This EUA will remain  in effect (meaning this test can be used) for the duration of the COVID-19 declaration under Section 564(b)(1) of the Act, 21 U.S.C. section 360bbb-3(b)(1), unless the authorization is terminated or revoked sooner.    Influenza A by PCR NEGATIVE NEGATIVE Final   Influenza B by PCR NEGATIVE NEGATIVE Final    Comment: (NOTE) The Xpert Xpress SARS-CoV-2/FLU/RSV assay is intended as an aid in  the diagnosis of influenza from Nasopharyngeal swab specimens and  should not be used as a sole basis for treatment. Nasal washings and  aspirates are unacceptable for Xpert Xpress SARS-CoV-2/FLU/RSV  testing. Fact Sheet for Patients: PinkCheek.be Fact Sheet for Healthcare Providers: GravelBags.it This test is not yet approved or cleared by the [place] FDA and  has been authorized for detection and/or diagnosis of SARS-CoV-2 by  FDA under an Emergency Use Authorization (EUA). This EUA will remain  in effect (meaning this test can be used) for the duration of the  Covid-19 declaration under Section 564(b)(1) of the Act, 21  U.S.C. section 360bbb-3(b)(1), unless the authorization is  terminated or revoked. Performed at [facility], [place] [address] [place], [place] [address]     me coordinating discharge: 35 minutes The  controlled substances registry was reviewed for this patient prior to filling the <5 days supply  controlled substances script.      SIGNED:   Edwin Dada, MD  [facility] 02/01/2020, 9:59 AM

## 2020-02-02 ENCOUNTER — Telehealth: Payer: Self-pay | Admitting: Nurse Practitioner

## 2020-02-02 ENCOUNTER — Ambulatory Visit (INDEPENDENT_AMBULATORY_CARE_PROVIDER_SITE_OTHER): Payer: Medicare Other | Admitting: Podiatry

## 2020-02-02 ENCOUNTER — Other Ambulatory Visit: Payer: Self-pay

## 2020-02-02 ENCOUNTER — Telehealth: Payer: Self-pay

## 2020-02-02 DIAGNOSIS — L97521 Non-pressure chronic ulcer of other part of left foot limited to breakdown of skin: Secondary | ICD-10-CM

## 2020-02-02 NOTE — Telephone Encounter (Signed)
Transition Care Management Follow-up Telephone Call  Date of discharge and from where: [name] 02/01/2020 Called pt at [phone] DOB and name verified. Stated he is at the doctor office and not a good time to talk . Will try again at another time.  Scheduled f /u visit on May 18/2021 at 10:50 am with NP Juluis Mire.

## 2020-02-02 NOTE — Telephone Encounter (Signed)
Transition of care contact from inpatient facility  Date of discharge: 02/01/2020 Date of contact: 02/02/2020 Method: Phone Spoke to: Patient  Patient contacted to discuss transition of care from recent inpatient hospitalization. Patient was admitted to [facility] from 05/04-05/02/2020 with discharge diagnosis of Diabetic foot ulcer, clotted AVG  Medication changes were reviewed.  Patient will follow up with his/her outpatient HD unit on: 02/02/2020. Patient came to HD but AVG clotted again. Sent to [place].

## 2020-02-05 ENCOUNTER — Telehealth: Payer: Self-pay | Admitting: *Deleted

## 2020-02-05 NOTE — Telephone Encounter (Signed)
Pt called states he has not had any home health nurse come to his house and he knows there are more agencies out there and he is suppose to have the dressing changed every other day and he knows how detrimental it is for this to be done.

## 2020-02-05 NOTE — Telephone Encounter (Signed)
Transition Care Management Follow-up Telephone Call  Date of discharge and from where: 02/01/2020, [facility]   How have you been since you were released from the hospital? He said he is " going through a lot right now."  He did not specify what he is going through.  The services of LCSW were offered but he was not interested at this time.  Any questions or concerns?  none at this time   He said he has a lot on his schedule and he did not have much time to talk so not all of the questions were addressed.  He noted a couple of times that he has a lot going on.   Items Reviewed:  Did the pt receive and understand the discharge instructions provided?  yes  Medications obtained and verified?  he said that he has the medications. No questions at this time  Any new allergies since your discharge? none reported   Do you have support at home?  - not addressed   Other (ie: DME, Home Health, etc) attends dialysis  M/W/F  Home health services are being arranged by podiatry.  Functional Questionnaire: (I = Independent and D = Dependent) ADL's: not addressed     Follow up appointments reviewed:    PCP Hospital f/u appt confirmed? Appointment with Ms Johnnette Barrios 02/13/2020 @ 1050.  [facility] f/u appt confirmed? .podiatry 02/06/2020 and he also was podiatry last week.   Are transportation arrangements needed? not addressed  If their condition worsens, is the pt aware to call  their PCP or go to the ED?  yes  Was the patient provided with contact information for the PCP's office or ED? he has the phone number  Was the pt encouraged to call back with questions or concerns?  yes

## 2020-02-05 NOTE — Telephone Encounter (Signed)
Faxed required forms, clinicals and demographics to [facility] on 02/02/2020. I spoke with [facility] and she states they do not have any nursing staff for wound care.

## 2020-02-05 NOTE — Telephone Encounter (Signed)
Faxed required form, clinicals and demographics to [facility].

## 2020-02-05 NOTE — Telephone Encounter (Signed)
I called pt and informed that we were still working on getting him a [facility] agency but due to nursing staff shortage we had not been able to get him established with an agency. Pt states he forgets about the COVID, but understands. I told pt we have an appt tomorrow with Dr. Cannon Kettle at 9:15am and he states he will be here.

## 2020-02-05 NOTE — Telephone Encounter (Signed)
[facility] [place] states they do not have nursing staff.

## 2020-02-06 ENCOUNTER — Ambulatory Visit (INDEPENDENT_AMBULATORY_CARE_PROVIDER_SITE_OTHER): Payer: Medicare Other | Admitting: Sports Medicine

## 2020-02-06 ENCOUNTER — Other Ambulatory Visit: Payer: Self-pay

## 2020-02-06 ENCOUNTER — Encounter: Payer: Self-pay | Admitting: Sports Medicine

## 2020-02-06 DIAGNOSIS — Z8739 Personal history of other diseases of the musculoskeletal system and connective tissue: Secondary | ICD-10-CM

## 2020-02-06 DIAGNOSIS — E1142 Type 2 diabetes mellitus with diabetic polyneuropathy: Secondary | ICD-10-CM

## 2020-02-06 DIAGNOSIS — Z899 Acquired absence of limb, unspecified: Secondary | ICD-10-CM

## 2020-02-06 DIAGNOSIS — L97521 Non-pressure chronic ulcer of other part of left foot limited to breakdown of skin: Secondary | ICD-10-CM | POA: Diagnosis not present

## 2020-02-06 NOTE — Progress Notes (Signed)
Subjective: [name] is a 39 y.o. male patient seen in office for evaluation of ulceration of the left foot.  Patient reports that he has not been able to get a home nurse to come out due to shortage of staff so he has kept bandage in place.  Patient reports that he is also been taking the ciprofloxacin by mouth and has not started it with dialysis at this time.  Patient is concerned because he feels as if office is not doing enough to get him in with a home nursing agency.  Otherwise he is doing okay.  Patient has no other pedal complaints at this time.  Patient Active Problem List   Diagnosis Date Noted  . Cellulitis of left foot 01/31/2020  . Allergy, unspecified, initial encounter 07/17/2019  . Diarrhea 04/24/2019  . Bone erosion determined by x-ray   . Encephalopathy acute   . 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 Open wound of left foot   . Encounter for orthopedic aftercare following surgical amputation   . Foot infection   . Gangrene of toe of left foot ([place])   . Cellulitis 10/26/2018  . Diabetic ulcer of left midfoot associated with diabetes mellitus due to underlying condition, with bone involvement without evidence of necrosis ([place])   . Nausea vomiting and diarrhea 10/20/2018  . Septic arthritis of left foot ([place])   . DM (diabetes mellitus), secondary, uncontrolled, with neurologic complications ([place])   . Headache 09/23/2018  . Osteomyelitis of foot, left, acute ([place])   . Acute osteomyelitis involving ankle and foot, left ([place])   . Osteomyelitis of fifth toe of left foot ([place])   . Anemia of chronic disease 08/18/2018  . Encounter for immunization 06/29/2018  . Hyperkalemia   . Unspecified protein-calorie malnutrition ([place]) 02/14/2018  . Encounter for removal of sutures 02/07/2018  . Osteomyelitis ([place]) 02/01/2018  . Fluid overload, unspecified 01/22/2018  . Osteomyelitis of ankle or  foot, acute, right ([place]) 10/30/2017  . Cellulitis and abscess of toe of left foot   . Diabetic foot infection ([place])   . Insulin dependent diabetes mellitus 10/25/2017  . Diabetic gastroparesis ([place]) 10/25/2017  . Sepsis ([place]) 10/25/2017  . Fever, unspecified 10/28/2016  . Iron deficiency anemia, unspecified 10/28/2016  . Other specified coagulation defects ([place]) 10/28/2016  . Pruritus, unspecified 10/28/2016  . Secondary hyperparathyroidism of renal origin ([place]) 10/28/2016  . Shortness of breath 10/28/2016  . HTN (hypertension) 08/09/2014  . ESRD on dialysis ([place]) 03/19/2014  . MSSA bacteremia 05/31/2013  . Metabolic acidosis 96/22/2979   Current Outpatient Medications on File Prior to Visit  Medication Sig Dispense Refill  . calcitRIOL (ROCALTROL) 0.5 MCG capsule Take 5 capsules (2.5 mcg total) by mouth every Monday, Wednesday, and Friday with hemodialysis. 30 capsule 0  . ciprofloxacin (CIPRO) 500 MG tablet Take 1 tablet (500 mg total) by mouth daily with breakfast for 14 days. 14 tablet 0  . NOVOLOG 100 UNIT/ML injection INJECT 6 TO 7 UNITS SUBCUTANEOUSLY TWICE DAILY WITH A MEAL (Patient taking differently: Inject 7 Units into the skin 3 (three) times daily with meals. ) 10 mL 0  . oxycodone (OXY-IR) 5 MG capsule Take 1 capsule (5 mg total) by mouth 2 (two) times daily as needed. 4 capsule 0  . sevelamer carbonate (RENVELA) 800 MG tablet Take 2,400-4,000 mg by mouth See admin instructions. Take 5 tablets with meals then take 3 tablets  with snacks     No current facility-administered medications on file prior to visit.   No Known Allergies  Recent Results (from the past 2160 hour(s))  House Account Tracking only     Status: None   Collection Time: 12/12/19 11:00 AM  Result Value Ref Range   Tracking House Account      Comment: We were unable to identify an account number for the order submitted. If you do not have a [facility]  account number or if your account  information needs  to be updated please call [phone] for assistance. . To prevent delays in testing and processing of your orders please provide the following information for this order and with every additional order submitted: [facility] account number and account name  Client address Client phone and fax number NPI number of ordering physician along with the physician name.   WOUND CULTURE     Status: None   Collection Time: 12/12/19 11:00 AM  Result Value Ref Range   MICRO NUMBER: [id number]    SPECIMEN QUALITY: Adequate    SOURCE: R BALL OF THE FOOT ULCER    STATUS: FINAL    GRAM STAIN:      No white blood cells seen No epithelial cells seen Moderate Gram negative bacilli   RESULT:      A mix of non-predominating organisms of questionable significance was recovered on culture and not further identified. (Note: Growth did not detect the presence of S.aureus, beta-hemolytic Streptococci or P.aeruginosa).  Lactic acid, plasma     Status: None   Collection Time: 01/27/20  5:43 PM  Result Value Ref Range   Lactic Acid, Venous 1.9 0.5 - 1.9 mmol/L    Comment: Performed at [facility], [place] [address] [place], [place] [address]  Comprehensive metabolic panel     Status: Abnormal   Collection Time: 01/27/20  5:43 PM  Result Value Ref Range   Sodium 136 135 - 145 mmol/L   Potassium 4.8 3.5 - 5.1 mmol/L   Chloride 95 (L) 98 - 111 mmol/L   CO2 27 22 - 32 mmol/L   Glucose, Bld 93 70 - 99 mg/dL    Comment: Glucose reference range applies only to samples taken after fasting for at least 8 hours.   BUN 44 (H) 6 - 20 mg/dL   Creatinine, Ser 9.91 (H) 0.61 - 1.24 mg/dL   Calcium 8.9 8.9 - 10.3 mg/dL   Total Protein 7.0 6.5 - 8.1 g/dL   Albumin 3.2 (L) 3.5 - 5.0 g/dL   AST 27 15 - 41 U/L   ALT 22 0 - 44 U/L   Alkaline Phosphatase 139 (H) 38 - 126 U/L   Total Bilirubin 1.0 0.3 - 1.2 mg/dL   GFR calc non Af Amer 6 (L) >60 mL/min   GFR calc Af Amer 7 (L) >60 mL/min    Anion gap 14 5 - 15    Comment: Performed at [facility] [address] [place], [place] [address]  CBC with Differential     Status: Abnormal   Collection Time: 01/27/20  5:43 PM  Result Value Ref Range   WBC 10.5 4.0 - 10.5 K/uL   RBC 5.13 4.22 - 5.81 MIL/uL   Hemoglobin 15.8 13.0 - 17.0 g/dL   HCT 49.4 39.0 - 52.0 %   MCV 96.3 80.0 - 100.0 fL   MCH 30.8 26.0 - 34.0 pg   MCHC 32.0 30.0 - 36.0 g/dL  RDW 12.4 11.5 - 15.5 %   Platelets 190 150 - 400 K/uL   nRBC 0.0 0.0 - 0.2 %   Neutrophils Relative % 67 %   Neutro Abs 7.0 1.7 - 7.7 K/uL   Lymphocytes Relative 16 %   Lymphs Abs 1.7 0.7 - 4.0 K/uL   Monocytes Relative 9 %   Monocytes Absolute 1.0 0.1 - 1.0 K/uL   Eosinophils Relative 6 %   Eosinophils Absolute 0.7 (H) 0.0 - 0.5 K/uL   Basophils Relative 1 %   Basophils Absolute 0.1 0.0 - 0.1 K/uL   Immature Granulocytes 1 %   Abs Immature Granulocytes 0.07 0.00 - 0.07 K/uL    Comment: Performed at [facility] [address], [place], [place] [address]  CBG monitoring, ED     Status: Abnormal   Collection Time: 01/27/20 10:11 PM  Result Value Ref Range   Glucose-Capillary 123 (H) 70 - 99 mg/dL    Comment: Glucose reference range applies only to samples taken after fasting for at least 8 hours.  Glucose, capillary     Status: Abnormal   Collection Time: 01/29/20 12:19 PM  Result Value Ref Range   Glucose-Capillary 127 (H) 70 - 99 mg/dL    Comment: Glucose reference range applies only to samples taken after fasting for at least 8 hours.   Comment 1 Notify RN    Comment 2 Document in Chart   Basic metabolic panel     Status: Abnormal   Collection Time: 01/29/20  1:04 PM  Result Value Ref Range   Sodium 132 (L) 135 - 145 mmol/L   Potassium 7.1 (HH) 3.5 - 5.1 mmol/L    Comment: CRITICAL RESULT CALLED TO, READ BACK BY AND VERIFIED WITH: Rolan Bucco RN [phone] [facility]    Chloride 98 98 - 111 mmol/L   CO2 21 (L) 22 - 32 mmol/L    Glucose, Bld 163 (H) 70 - 99 mg/dL    Comment: Glucose reference range applies only to samples taken after fasting for at least 8 hours.   BUN 72 (H) 6 - 20 mg/dL   Creatinine, Ser 13.00 (H) 0.61 - 1.24 mg/dL   Calcium 8.2 (L) 8.9 - 10.3 mg/dL   GFR calc non Af Amer 4 (L) >60 mL/min   GFR calc Af Amer 5 (L) >60 mL/min   Anion gap 13 5 - 15    Comment: Performed at [facility] [address] [place], [place] [address]  CBC with Differential     Status: Abnormal   Collection Time: 01/30/20  8:22 PM  Result Value Ref Range   WBC 10.9 (H) 4.0 - 10.5 K/uL   RBC 4.82 4.22 - 5.81 MIL/uL   Hemoglobin 14.9 13.0 - 17.0 g/dL   HCT 46.4 39.0 - 52.0 %   MCV 96.3 80.0 - 100.0 fL   MCH 30.9 26.0 - 34.0 pg   MCHC 32.1 30.0 - 36.0 g/dL   RDW 12.7 11.5 - 15.5 %   Platelets 252 150 - 400 K/uL   nRBC 0.0 0.0 - 0.2 %   Neutrophils Relative % 78 %   Neutro Abs 8.6 (H) 1.7 - 7.7 K/uL   Lymphocytes Relative 10 %   Lymphs Abs 1.1 0.7 - 4.0 K/uL   Monocytes Relative 6 %   Monocytes Absolute 0.6 0.1 - 1.0 K/uL   Eosinophils Relative 5 %   Eosinophils Absolute 0.5 0.0 - 0.5 K/uL  Basophils Relative 1 %   Basophils Absolute 0.1 0.0 - 0.1 K/uL   Immature Granulocytes 0 %   Abs Immature Granulocytes 0.02 0.00 - 0.07 K/uL    Comment: Performed at [facility], [place] [address], [place], [place] [address]  Comprehensive metabolic panel     Status: Abnormal   Collection Time: 01/30/20  8:22 PM  Result Value Ref Range   Sodium 133 (L) 135 - 145 mmol/L   Potassium 6.7 (HH) 3.5 - 5.1 mmol/L    Comment: NO VISIBLE HEMOLYSIS CRITICAL RESULT CALLED TO, READ BACK BY AND VERIFIED WITH: Selena Lesser RN [phone]    Chloride 95 (L) 98 - 111 mmol/L   CO2 23 22 - 32 mmol/L   Glucose, Bld 206 (H) 70 - 99 mg/dL    Comment: Glucose reference range applies only to samples taken after fasting for at least 8 hours.   BUN 89 (H) 6 - 20 mg/dL   Creatinine, Ser 14.87 (H) 0.61 - 1.24 mg/dL    Calcium 8.2 (L) 8.9 - 10.3 mg/dL   Total Protein 6.7 6.5 - 8.1 g/dL   Albumin 3.3 (L) 3.5 - 5.0 g/dL   AST 16 15 - 41 U/L   ALT 16 0 - 44 U/L   Alkaline Phosphatase 140 (H) 38 - 126 U/L   Total Bilirubin 0.9 0.3 - 1.2 mg/dL   GFR calc non Af Amer 4 (L) >60 mL/min   GFR calc Af Amer 4 (L) >60 mL/min   Anion gap 15 5 - 15    Comment: Performed at [facility] [address], [place], [place] [address]  CBG monitoring, ED     Status: Abnormal   Collection Time: 01/30/20 10:00 PM  Result Value Ref Range   Glucose-Capillary 180 (H) 70 - 99 mg/dL    Comment: Glucose reference range applies only to samples taken after fasting for at least 8 hours.  CBC with Differential/Platelet     Status: None   Collection Time: 01/31/20  6:22 AM  Result Value Ref Range   WBC 8.5 4.0 - 10.5 K/uL   RBC 4.76 4.22 - 5.81 MIL/uL   Hemoglobin 14.7 13.0 - 17.0 g/dL   HCT 46.6 39.0 - 52.0 %   MCV 97.9 80.0 - 100.0 fL   MCH 30.9 26.0 - 34.0 pg   MCHC 31.5 30.0 - 36.0 g/dL   RDW 12.8 11.5 - 15.5 %   Platelets 205 150 - 400 K/uL   nRBC 0.0 0.0 - 0.2 %   Neutrophils Relative % 76 %   Neutro Abs 6.5 1.7 - 7.7 K/uL   Lymphocytes Relative 13 %   Lymphs Abs 1.1 0.7 - 4.0 K/uL   Monocytes Relative 5 %   Monocytes Absolute 0.4 0.1 - 1.0 K/uL   Eosinophils Relative 5 %   Eosinophils Absolute 0.5 0.0 - 0.5 K/uL   Basophils Relative 1 %   Basophils Absolute 0.0 0.0 - 0.1 K/uL   Immature Granulocytes 0 %   Abs Immature Granulocytes 0.02 0.00 - 0.07 K/uL    Comment: Performed at [facility], [address], [place], [place] [address]  Troponin I (High Sensitivity)     Status: None   Collection Time: 01/31/20  6:22 AM  Result Value Ref Range   Troponin I (High Sensitivity) 4 <18 ng/L    Comment: (NOTE) Elevated high sensitivity troponin I (hsTnI) values and significant  changes across serial measurements  may suggest ACS but many other  chronic and acute conditions are known to elevate hsTnI  results.  Refer to the Links section for chest pain algorithms and additional  guidance. Performed at [facility], [place] [address], [place], [place] [address]   Hemoglobin A1c     Status: Abnormal   Collection Time: 01/31/20  6:22 AM  Result Value Ref Range   Hgb A1c MFr Bld 8.9 (H) 4.8 - 5.6 %    Comment: (NOTE) Pre diabetes:          5.7%-6.4% Diabetes:              >6.4% Glycemic control for   <7.0% adults with diabetes    Mean Plasma Glucose 208.73 mg/dL    Comment: Performed at [facility] [address], [place], [place] [address]  Glucose, capillary     Status: Abnormal   Collection Time: 01/31/20  6:27 AM  Result Value Ref Range   Glucose-Capillary 459 (H) 70 - 99 mg/dL    Comment: Glucose reference range applies only to samples taken after fasting for at least 8 hours.   Comment 1 Notify RN    Comment 2 Document in Chart   Respiratory Panel by RT PCR (Flu A&B, Covid) - Nasopharyngeal Swab     Status: None   Collection Time: 01/31/20  9:10 AM   Specimen: Nasopharyngeal Swab  Result Value Ref Range   SARS Coronavirus 2 by RT PCR NEGATIVE NEGATIVE    Comment: (NOTE) SARS-CoV-2 target nucleic acids are NOT DETECTED. The SARS-CoV-2 RNA is generally detectable in upper respiratoy specimens during the acute phase of infection. The lowest concentration of SARS-CoV-2 viral copies this assay can detect is 131 copies/mL. A negative result does not preclude SARS-Cov-2 infection and should not be used as the sole basis for treatment or other patient management decisions. A negative result may occur with  improper specimen collection/handling, submission of specimen other than nasopharyngeal swab, presence of viral mutation(s) within the areas targeted by this assay, and inadequate number of viral copies (<131 copies/mL). A negative result must be combined with clinical observations, patient history, and epidemiological information. The expected result is  Negative. Fact Sheet for Patients:  PinkCheek.be Fact Sheet for Healthcare Providers:  GravelBags.it This test is not yet ap proved or cleared by the [place] FDA and  has been authorized for detection and/or diagnosis of SARS-CoV-2 by FDA under an Emergency Use Authorization (EUA). This EUA will remain  in effect (meaning this test can be used) for the duration of the COVID-19 declaration under Section 564(b)(1) of the Act, 21 U.S.C. section 360bbb-3(b)(1), unless the authorization is terminated or revoked sooner.    Influenza A by PCR NEGATIVE NEGATIVE   Influenza B by PCR NEGATIVE NEGATIVE    Comment: (NOTE) The Xpert Xpress SARS-CoV-2/FLU/RSV assay is intended as an aid in  the diagnosis of influenza from Nasopharyngeal swab specimens and  should not be used as a sole basis for treatment. Nasal washings and  aspirates are unacceptable for Xpert Xpress SARS-CoV-2/FLU/RSV  testing. Fact Sheet for Patients: PinkCheek.be Fact Sheet for Healthcare Providers: GravelBags.it This test is not yet approved or cleared by the [place] FDA and  has been authorized for detection and/or diagnosis of SARS-CoV-2 by  FDA under an Emergency Use Authorization (EUA). This EUA will remain  in effect (meaning this test can be used) for the duration of the  Covid-19 declaration under Section 564(b)(1) of the  Act, 21  U.S.C. section 360bbb-3(b)(1), unless the authorization is  terminated or revoked. Performed at [facility], [place] [address] [place], [place] [address]   Glucose, capillary     Status: Abnormal   Collection Time: 01/31/20 10:05 AM  Result Value Ref Range   Glucose-Capillary 167 (H) 70 - 99 mg/dL    Comment: Glucose reference range applies only to samples taken after fasting for at least 8 hours.  Glucose, capillary     Status: Abnormal   Collection Time:  01/31/20 12:30 PM  Result Value Ref Range   Glucose-Capillary 164 (H) 70 - 99 mg/dL    Comment: Glucose reference range applies only to samples taken after fasting for at least 8 hours.   Comment 1 Notify RN    Comment 2 Document in Chart   Glucose, capillary     Status: Abnormal   Collection Time: 01/31/20  7:27 PM  Result Value Ref Range   Glucose-Capillary 304 (H) 70 - 99 mg/dL    Comment: Glucose reference range applies only to samples taken after fasting for at least 8 hours.  CBC     Status: None   Collection Time: 02/01/20  4:36 AM  Result Value Ref Range   WBC 9.5 4.0 - 10.5 K/uL   RBC 4.67 4.22 - 5.81 MIL/uL   Hemoglobin 14.7 13.0 - 17.0 g/dL   HCT 44.7 39.0 - 52.0 %   MCV 95.7 80.0 - 100.0 fL   MCH 31.5 26.0 - 34.0 pg   MCHC 32.9 30.0 - 36.0 g/dL   RDW 12.9 11.5 - 15.5 %   Platelets 178 150 - 400 K/uL   nRBC 0.0 0.0 - 0.2 %    Comment: Performed at [facility], [place] [address] [place], [place] [address]  Renal function panel     Status: Abnormal   Collection Time: 02/01/20  4:36 AM  Result Value Ref Range   Sodium 130 (L) 135 - 145 mmol/L   Potassium 4.8 3.5 - 5.1 mmol/L   Chloride 94 (L) 98 - 111 mmol/L   CO2 18 (L) 22 - 32 mmol/L   Glucose, Bld 351 (H) 70 - 99 mg/dL    Comment: Glucose reference range applies only to samples taken after fasting for at least 8 hours.   BUN 48 (H) 6 - 20 mg/dL   Creatinine, Ser 10.14 (H) 0.61 - 1.24 mg/dL   Calcium 7.6 (L) 8.9 - 10.3 mg/dL   Phosphorus 3.6 2.5 - 4.6 mg/dL   Albumin 3.0 (L) 3.5 - 5.0 g/dL   GFR calc non Af Amer 6 (L) >60 mL/min   GFR calc Af Amer 7 (L) >60 mL/min   Anion gap 18 (H) 5 - 15    Comment: Performed at [facility] [address], [place],  [address]    Objective: There were no vitals filed for this visit.  General: Patient is awake, alert, oriented x 3 and in no acute distress.  Dermatology: Skin is warm and dry bilateral with a full thickness ulceration present plantar  lateral left foot. Ulceration measures 3 cm x 4cm x 0.3 cm.  With decreased macerated border with a now 100% granular base. The ulceration does not probe to bone. There is no malodor, minimal clear active drainage, minimal erythema, focal edema. No other acute signs of infection.   Vascular: Dorsalis Pedis pulse =1 /4 Bilateral,  Posterior Tibial pulse = 1/4 Bilateral,  Capillary Fill Time <  5 seconds  Neurologic: Protective sensation absent bilateral.  Musculosketal: + deformity with history of multiple digital amputations   Recent Labs    04/24/19 0915 04/24/19 1939  GRAMSTAIN  --  NO WBC SEEN FEW GRAM POSITIVE COCCI   LABORGA STAPHYLOCOCCUS AUREUS STAPHYLOCOCCUS AUREUS  ENTEROCOCCUS FAECALIS    Assessment and Plan:  Problem List Items Addressed This Visit    None    Visit Diagnoses    Ulcer of left foot, limited to breakdown of skin ([place])    -  Primary   Diabetic polyneuropathy associated with type 2 diabetes mellitus ([place])       History of amputation       History of osteomyelitis         -Re-Examined patient and discussed the progression of the wound and treatment alternatives. -Cleanse ulceration there was not much buildup of nonviable tissue at this visit so no debridement was performed. -Applied Iodosorb and dry sterile dressing and instructed patient to return to office on Thursday for another dressing change until we can get his home nursing set up -Advised patient that our office is doing the best that we can with trying to find a different agency however at this time there is staffing shortages and Mateo Flow our office nurse has been unable to find someone that could take on his case -Continue with Cipro antibiotic until completed orally -Advised patient continue with cam boot - Advised patient to go to the ER or return to office if the wound worsens or if constitutional symptoms are present. -Patient to return to office for follow up care with Dr. March Rummage later this week  or sooner if problems or issues arise.  Landis Martins, DPM

## 2020-02-08 ENCOUNTER — Ambulatory Visit (INDEPENDENT_AMBULATORY_CARE_PROVIDER_SITE_OTHER): Payer: Medicare Other | Admitting: Podiatry

## 2020-02-08 ENCOUNTER — Other Ambulatory Visit: Payer: Self-pay

## 2020-02-08 DIAGNOSIS — L97521 Non-pressure chronic ulcer of other part of left foot limited to breakdown of skin: Secondary | ICD-10-CM

## 2020-02-08 NOTE — Progress Notes (Signed)
  Subjective:  Patient ID: [name], male    DOB: 1981-04-19,  MRN: [id number]  Chief Complaint  Patient presents with  . Wound Check    Left foot wound check. Pt states "I do not feel good". Denies fever/chills/nausea/vomiting.    39 y.o. male presents for wound care. Hx confirmed with patient. Feels like he is off from dialysis and gastroparesis. Objective:  Physical Exam: Wound Location: left lateral foot Wound Measurement: 4x4 Wound Base: Granular/Healthy Peri-wound: Calloused Exudate: None: wound tissue dry wound without warmth, erythema, signs of acute infection and appears improved compared to last recheck  Assessment:   1. Ulcer of left foot, limited to breakdown of skin [facility])      Plan:  Patient was evaluated and treated and all questions answered.  Ulcer left lateral foot -Dressing applied consisting of silvadene -Offload ulcer with CAM boot -His feeling off today does not appear to be from his foot. Will monitor closely.  Procedure: Selective Debridement of Wound Rationale: Removal of devitalized tissue from the wound to promote healing.  Pre-Debridement Wound Measurements: 4 cm x 4 cm x 0.2 cm  Post-Debridement Wound Measurements: same as pre-debridement. Type of Debridement: sharp selective Tissue Removed: Devitalized soft-tissue Dressing: Dry, sterile, compression dressing. Disposition: Patient tolerated procedure well. Patient to return in 1 week for follow-up.   No follow-ups on file.

## 2020-02-09 NOTE — Telephone Encounter (Signed)
Noted thanks °

## 2020-02-13 ENCOUNTER — Encounter (INDEPENDENT_AMBULATORY_CARE_PROVIDER_SITE_OTHER): Payer: Self-pay | Admitting: Primary Care

## 2020-02-13 ENCOUNTER — Ambulatory Visit (INDEPENDENT_AMBULATORY_CARE_PROVIDER_SITE_OTHER): Payer: Medicare Other | Admitting: Primary Care

## 2020-02-13 ENCOUNTER — Encounter: Payer: Self-pay | Admitting: Sports Medicine

## 2020-02-13 ENCOUNTER — Ambulatory Visit (INDEPENDENT_AMBULATORY_CARE_PROVIDER_SITE_OTHER): Payer: Medicare Other | Admitting: Sports Medicine

## 2020-02-13 ENCOUNTER — Encounter (HOSPITAL_COMMUNITY): Payer: Self-pay | Admitting: Emergency Medicine

## 2020-02-13 ENCOUNTER — Other Ambulatory Visit: Payer: Self-pay

## 2020-02-13 ENCOUNTER — Inpatient Hospital Stay (HOSPITAL_COMMUNITY)
Admission: EM | Admit: 2020-02-13 | Discharge: 2020-02-18 | DRG: 177 | Disposition: A | Discharge status: Home / Self Care | Payer: Medicare Other | Attending: Internal Medicine | Admitting: Internal Medicine

## 2020-02-13 VITALS — BP 96/63 | HR 89 | Temp 97.3°F | Ht 67.0 in | Wt 155.0 lb

## 2020-02-13 VITALS — BP 128/90 | HR 61 | Temp 98.0°F | Resp 16

## 2020-02-13 DIAGNOSIS — I959 Hypotension, unspecified: Secondary | ICD-10-CM

## 2020-02-13 DIAGNOSIS — I120 Hypertensive chronic kidney disease with stage 5 chronic kidney disease or end stage renal disease: Secondary | ICD-10-CM | POA: Diagnosis present

## 2020-02-13 DIAGNOSIS — L089 Local infection of the skin and subcutaneous tissue, unspecified: Secondary | ICD-10-CM | POA: Diagnosis present

## 2020-02-13 DIAGNOSIS — Z841 Family history of disorders of kidney and ureter: Secondary | ICD-10-CM

## 2020-02-13 DIAGNOSIS — Z992 Dependence on renal dialysis: Secondary | ICD-10-CM

## 2020-02-13 DIAGNOSIS — N186 End stage renal disease: Secondary | ICD-10-CM | POA: Diagnosis present

## 2020-02-13 DIAGNOSIS — Z8739 Personal history of other diseases of the musculoskeletal system and connective tissue: Secondary | ICD-10-CM

## 2020-02-13 DIAGNOSIS — M14672 Charcot's joint, left ankle and foot: Secondary | ICD-10-CM

## 2020-02-13 DIAGNOSIS — L97422 Non-pressure chronic ulcer of left heel and midfoot with fat layer exposed: Secondary | ICD-10-CM | POA: Diagnosis present

## 2020-02-13 DIAGNOSIS — Z899 Acquired absence of limb, unspecified: Secondary | ICD-10-CM | POA: Diagnosis not present

## 2020-02-13 DIAGNOSIS — E1059 Type 1 diabetes mellitus with other circulatory complications: Secondary | ICD-10-CM | POA: Diagnosis not present

## 2020-02-13 DIAGNOSIS — E118 Type 2 diabetes mellitus with unspecified complications: Secondary | ICD-10-CM | POA: Diagnosis present

## 2020-02-13 DIAGNOSIS — E1043 Type 1 diabetes mellitus with diabetic autonomic (poly)neuropathy: Secondary | ICD-10-CM | POA: Diagnosis present

## 2020-02-13 DIAGNOSIS — E1022 Type 1 diabetes mellitus with diabetic chronic kidney disease: Secondary | ICD-10-CM | POA: Diagnosis present

## 2020-02-13 DIAGNOSIS — U071 COVID-19: Secondary | ICD-10-CM | POA: Diagnosis not present

## 2020-02-13 DIAGNOSIS — N2581 Secondary hyperparathyroidism of renal origin: Secondary | ICD-10-CM | POA: Diagnosis present

## 2020-02-13 DIAGNOSIS — E1142 Type 2 diabetes mellitus with diabetic polyneuropathy: Secondary | ICD-10-CM

## 2020-02-13 DIAGNOSIS — E875 Hyperkalemia: Secondary | ICD-10-CM | POA: Diagnosis present

## 2020-02-13 DIAGNOSIS — E10649 Type 1 diabetes mellitus with hypoglycemia without coma: Secondary | ICD-10-CM | POA: Diagnosis not present

## 2020-02-13 DIAGNOSIS — E11628 Type 2 diabetes mellitus with other skin complications: Secondary | ICD-10-CM | POA: Diagnosis not present

## 2020-02-13 DIAGNOSIS — K3184 Gastroparesis: Secondary | ICD-10-CM | POA: Diagnosis present

## 2020-02-13 DIAGNOSIS — D696 Thrombocytopenia, unspecified: Secondary | ICD-10-CM | POA: Diagnosis present

## 2020-02-13 DIAGNOSIS — Z89421 Acquired absence of other right toe(s): Secondary | ICD-10-CM

## 2020-02-13 DIAGNOSIS — Z79899 Other long term (current) drug therapy: Secondary | ICD-10-CM

## 2020-02-13 DIAGNOSIS — E10621 Type 1 diabetes mellitus with foot ulcer: Secondary | ICD-10-CM | POA: Diagnosis present

## 2020-02-13 DIAGNOSIS — J1282 Pneumonia due to coronavirus disease 2019: Secondary | ICD-10-CM | POA: Diagnosis present

## 2020-02-13 DIAGNOSIS — E1143 Type 2 diabetes mellitus with diabetic autonomic (poly)neuropathy: Secondary | ICD-10-CM | POA: Diagnosis present

## 2020-02-13 DIAGNOSIS — K529 Noninfective gastroenteritis and colitis, unspecified: Secondary | ICD-10-CM | POA: Diagnosis present

## 2020-02-13 DIAGNOSIS — L97521 Non-pressure chronic ulcer of other part of left foot limited to breakdown of skin: Secondary | ICD-10-CM

## 2020-02-13 DIAGNOSIS — Z833 Family history of diabetes mellitus: Secondary | ICD-10-CM

## 2020-02-13 DIAGNOSIS — E108 Type 1 diabetes mellitus with unspecified complications: Secondary | ICD-10-CM | POA: Diagnosis present

## 2020-02-13 DIAGNOSIS — E109 Type 1 diabetes mellitus without complications: Secondary | ICD-10-CM | POA: Diagnosis present

## 2020-02-13 DIAGNOSIS — D631 Anemia in chronic kidney disease: Secondary | ICD-10-CM | POA: Diagnosis present

## 2020-02-13 DIAGNOSIS — I10 Essential (primary) hypertension: Secondary | ICD-10-CM | POA: Diagnosis present

## 2020-02-13 DIAGNOSIS — Z794 Long term (current) use of insulin: Secondary | ICD-10-CM

## 2020-02-13 DIAGNOSIS — Z8619 Personal history of other infectious and parasitic diseases: Secondary | ICD-10-CM

## 2020-02-13 DIAGNOSIS — Z89412 Acquired absence of left great toe: Secondary | ICD-10-CM

## 2020-02-13 DIAGNOSIS — E871 Hypo-osmolality and hyponatremia: Secondary | ICD-10-CM | POA: Diagnosis not present

## 2020-02-13 LAB — CBC
HCT: 40.5 % (ref 39.0–52.0)
Hemoglobin: 12.8 g/dL — ABNORMAL LOW (ref 13.0–17.0)
MCH: 30.4 pg (ref 26.0–34.0)
MCHC: 31.6 g/dL (ref 30.0–36.0)
MCV: 96.2 fL (ref 80.0–100.0)
Platelets: 111 10*3/uL — ABNORMAL LOW (ref 150–400)
RBC: 4.21 MIL/uL — ABNORMAL LOW (ref 4.22–5.81)
RDW: 13.8 % (ref 11.5–15.5)
WBC: 4.6 10*3/uL (ref 4.0–10.5)
nRBC: 0.4 % — ABNORMAL HIGH (ref 0.0–0.2)

## 2020-02-13 LAB — COMPREHENSIVE METABOLIC PANEL
ALT: 15 U/L (ref 0–44)
AST: 27 U/L (ref 15–41)
Albumin: 2.8 g/dL — ABNORMAL LOW (ref 3.5–5.0)
Alkaline Phosphatase: 87 U/L (ref 38–126)
Anion gap: 13 (ref 5–15)
BUN: 37 mg/dL — ABNORMAL HIGH (ref 6–20)
CO2: 26 mmol/L (ref 22–32)
Calcium: 7.7 mg/dL — ABNORMAL LOW (ref 8.9–10.3)
Chloride: 90 mmol/L — ABNORMAL LOW (ref 98–111)
Creatinine, Ser: 12.7 mg/dL — ABNORMAL HIGH (ref 0.61–1.24)
GFR calc Af Amer: 5 mL/min — ABNORMAL LOW (ref >60)
GFR calc non Af Amer: 4 mL/min — ABNORMAL LOW (ref >60)
Glucose, Bld: 98 mg/dL (ref 70–99)
Potassium: 4.7 mmol/L (ref 3.5–5.1)
Sodium: 129 mmol/L — ABNORMAL LOW (ref 135–145)
Total Bilirubin: 1 mg/dL (ref 0.3–1.2)
Total Protein: 6.4 g/dL — ABNORMAL LOW (ref 6.5–8.1)

## 2020-02-13 LAB — POCT CBG (FASTING - GLUCOSE)-MANUAL ENTRY: Glucose Fasting, POC: 179 mg/dL — AB (ref 70–99)

## 2020-02-13 LAB — LIPASE, BLOOD: Lipase: 20 U/L (ref 11–51)

## 2020-02-13 MED ORDER — LANTUS SOLOSTAR 100 UNIT/ML ~~LOC~~ SOPN: 12.0000 [IU] | PEN_INJECTOR | Freq: Every day | SUBCUTANEOUS | 99 refills | Status: DC

## 2020-02-13 NOTE — ED Triage Notes (Signed)
Pt reports unable to eat for 10 days due to emesis. On abx for foot ulcer but unable to take. Endorses generalized body pain. Had a temp HD cath taken out a week ago and endorses pain at site. Last HD treatment yesterday.

## 2020-02-13 NOTE — ED Notes (Signed)
[name] [phone] would like to be called when pt is in a room

## 2020-02-13 NOTE — Progress Notes (Signed)
Established Patient Office Visit  Subjective:  Patient ID: [name], male    DOB: Aug 29, 1981  Age: 39 y.o. MRN: [id number]  CC:  Chief Complaint  Patient presents with  . Hospitalization Follow-up    cellulitis of left foot   . Fall    injury to legs and hand     HPI [name] is a 39 year old African American male he presents today for the management of type 1 diabetes, hospital follow up and recent falls. He has a significant history of chronic kidney disease on dialysis on Monday , Wednesday and Friday. He presents today for loss of appetite unable to keep food down for last 4 days and he has pain in his neck where his cathter was placed remains bruised and painful especially when trying to move his head.   Past Medical History:  Diagnosis Date  . Diabetes mellitus without complication ([place])   . Diabetic gastroparesis ([place])   . Dialysis patient ([place])   . Hypertension   . Renal disorder    Dialysis  . Sepsis [facility])     Past Surgical History:  Procedure Laterality Date  . AMPUTATION Right 02/02/2018   Procedure: RIGHT FIFTH TOE AND METATARSAL AMPUTATION. Filetted toe flap metatarsal resection. Debridement Plantar Foot wound;  Surgeon: Evelina Bucy, DPM;  Location: [place];  Service: Podiatry;  Laterality: Right;  . AMPUTATION Left 08/20/2018   Procedure: FIFTH METATARSAL BONE BIOPSY;  Surgeon: Evelina Bucy, DPM;  Location: [place];  Service: Podiatry;  Laterality: Left;  . AMPUTATION Left 10/28/2018   Procedure: LEFT GREAT TOE AMPUTATION;  Surgeon: Evelina Bucy, DPM;  Location: [place];  Service: Podiatry;  Laterality: Left;  . APPLICATION OF WOUND VAC  02/02/2018   Procedure: APPLICATION OF WOUND VAC  Right Foot;  Surgeon: Evelina Bucy, DPM;  Location: [place];  Service: Podiatry;;  . APPLICATION OF WOUND VAC Left 10/28/2018   Procedure: APPLICATION OF WOUND VAC LEFT TOE;  Surgeon: Evelina Bucy, DPM;  Location: [place];  Service: Podiatry;  Laterality:  Left;  . APPLICATION OF WOUND VAC Left 11/01/2018   Procedure: APPLICATION OF WOUND VAC;  Surgeon: Evelina Bucy, DPM;  Location: [place];  Service: Podiatry;  Laterality: Left;  . AV FISTULA PLACEMENT     left arm.  . AV FISTULA PLACEMENT Right 12/22/2016   Procedure: INSERTION OF ARTERIOVENOUS (AV) GORE-TEX GRAFT ARM;  Surgeon: Elam Dutch, MD;  Location: [facility] OR;  Service: Vascular;  Laterality: Right;  . AV FISTULA PLACEMENT Left 05/26/2018   Procedure: INSERTION OF  ARTERIOVENOUS (AV) GORE-TEX GRAFT LEFT ARM;  Surgeon: Serafina Mitchell, MD;  Location: [place];  Service: Vascular;  Laterality: Left;  . EYE SURGERY    . I & D EXTREMITY Right 10/31/2017   Procedure: IRRIGATION AND DEBRIDEMENT RIGHT FOOT;  Surgeon: Evelina Bucy, DPM;  Location: [place];  Service: Podiatry;  Laterality: Right;  . I & D EXTREMITY Left 08/20/2018   Procedure: IRRIGATION AND DEBRIDEMENT EXTREMITY WITH SECONDARY WOUND CLOSUREAND APPLICATION OF WOUND VAC LEFT FOOT;  Surgeon: Evelina Bucy, DPM;  Location: [place];  Service: Podiatry;  Laterality: Left;  . I & D EXTREMITY Left 10/20/2018   Procedure: IRRIGATION AND DEBRIDEMENT LEFT FOOT  DEBRIDEMENT LATERAL FOOT WOUND;  Surgeon: Evelina Bucy, DPM;  Location: [place];  Service: Podiatry;  Laterality: Left;  . I & D EXTREMITY Left 10/28/2018   Procedure: IRRIGATION AND  DEBRIDEMENT LEFT TOE;  Surgeon: Evelina Bucy, DPM;  Location: [place];  Service: Podiatry;  Laterality: Left;  . INSERTION OF DIALYSIS CATHETER     Right subclavian  . IR AV DIALY SHUNT INTRO NEEDLE/INTRACATH INITIAL W/PTA/IMG RIGHT Right 02/05/2018  . IR FLUORO GUIDE CV LINE RIGHT  01/31/2020  . IR THROMBECTOMY AV FISTULA W/THROMBOLYSIS/PTA INC/SHUNT/IMG LEFT Left 08/24/2018  . IR THROMBECTOMY AV FISTULA W/THROMBOLYSIS/PTA INC/SHUNT/IMG LEFT Left 01/06/2019  . IR US GUIDE VASC ACCESS LEFT  08/24/2018  . IR US GUIDE VASC ACCESS RIGHT  02/05/2018  . IR US GUIDE VASC ACCESS RIGHT  01/31/2020  . IRRIGATION  AND DEBRIDEMENT FOOT Right 10/23/2018   Procedure: Irrigation and Debridement to tendon, Left Foot;  Surgeon: Evelina Bucy, DPM;  Location: [place];  Service: Podiatry;  Laterality: Right;  . IRRIGATION AND DEBRIDEMENT FOOT Left 11/01/2018   Procedure: IRRIGATION AND DEBRIDEMENT PARTIAL WOUND CLOSURE LOCAL TISSUE TRANSFER AND FLAP ROTATION, LEFT FOOT;  Surgeon: Evelina Bucy, DPM;  Location: [place];  Service: Podiatry;  Laterality: Left;  . TRANSMETATARSAL AMPUTATION N/A 08/18/2018   Procedure: IRRIGATION AND DEBRIDEMENT OF LEFT 5TH TOE AND TRANSMETATARSAL, WITH PARTICAL LEFT 5TH TOE AND METATARSAL AMPUTATION, BONE BIOPSY, WOUND VAC APPLICATION.;  Surgeon: Evelina Bucy, DPM;  Location: [place];  Service: Podiatry;  Laterality: N/A;  . UPPER EXTREMITY VENOGRAPHY N/A 11/16/2016   Procedure: Upper Extremity Venography - Right Central;  Surgeon: Elam Dutch, MD;  Location: [place] [facility];  Service: Cardiovascular;  Laterality: N/A;  . UPPER EXTREMITY VENOGRAPHY N/A 05/25/2018   Procedure: UPPER EXTREMITY VENOGRAPHY - Bilateral;  Surgeon: Marty Heck, MD;  Location: [place] [facility];  Service: Cardiovascular;  Laterality: N/A;    Family History  Problem Relation Age of Onset  . Diabetes Mellitus II Other   . Diabetes Father   . Renal Disease Father        ESRD    Social History   Socioeconomic History  . Marital status: Single    Spouse name: Not on file  . Number of children: Not on file  . Years of education: Not on file  . Highest education level: Not on file  Occupational History  . Not on file  Tobacco Use  . Smoking status: Never Smoker  . Smokeless tobacco: Never Used  Substance and Sexual Activity  . Alcohol use: No  . Drug use: No  . Sexual activity: Never  Other Topics Concern  . Not on file  Social History Narrative  . Not on file   Social Determinants of Health   Financial Resource Strain:   . Difficulty of Paying Living Expenses:   Food  Insecurity:   . Worried About Charity fundraiser in the Last Year:   . Arboriculturist in the Last Year:   Transportation Needs:   . Film/video editor (Medical):   Marland Kitchen Lack of Transportation (Non-Medical):   Physical Activity:   . Days of Exercise per Week:   . Minutes of Exercise per Session:   Stress:   . Feeling of Stress :   Social Connections:   . Frequency of Communication with Friends and Family:   . Frequency of Social Gatherings with Friends and Family:   . Attends Religious Services:   . Active Member of Clubs or Organizations:   . Attends Archivist Meetings:   Marland Kitchen Marital Status:   Intimate Partner Violence:   . Fear of Current or  Ex-Partner:   . Emotionally Abused:   Marland Kitchen Physically Abused:   . Sexually Abused:     Outpatient Medications Prior to Visit  Medication Sig Dispense Refill  . calcitRIOL (ROCALTROL) 0.5 MCG capsule Take 5 capsules (2.5 mcg total) by mouth every Monday, Wednesday, and Friday with hemodialysis. 30 capsule 0  . ciprofloxacin (CIPRO) 500 MG tablet Take 1 tablet (500 mg total) by mouth daily with breakfast for 14 days. 14 tablet 0  . NOVOLOG 100 UNIT/ML injection INJECT 6 TO 7 UNITS SUBCUTANEOUSLY TWICE DAILY WITH A MEAL (Patient taking differently: Inject 7 Units into the skin 3 (three) times daily with meals. ) 10 mL 0  . sevelamer carbonate (RENVELA) 800 MG tablet Take 2,400-4,000 mg by mouth See admin instructions. Take 5 tablets with meals then take 3 tablets with snacks    . oxycodone (OXY-IR) 5 MG capsule Take 1 capsule (5 mg total) by mouth 2 (two) times daily as needed. 4 capsule 0   No facility-administered medications prior to visit.    No Known Allergies  ROS Review of Systems  Constitutional: Positive for appetite change and fatigue.  Musculoskeletal: Positive for gait problem and neck pain.  All other systems reviewed and are negative.     Objective:    Physical Exam  Constitutional: He is oriented to person,  place, and time. He appears well-developed and well-nourished.  HENT:  Head: Normocephalic.  Neck:  Unable to move neck freely without pain  Cardiovascular: Normal rate and regular rhythm.  Pulmonary/Chest: Effort normal and breath sounds normal.  Abdominal: Bowel sounds are normal. He exhibits distension.  Musculoskeletal:        General: Normal range of motion.  Neurological: He is oriented to person, place, and time.  Skin: Skin is warm and dry.  Psychiatric: He has a normal mood and affect. His behavior is normal. Judgment and thought content normal.    BP 96/63 (BP Location: Right Arm, Patient Position: Sitting, Cuff Size: Normal)   Pulse 89   Temp (!) 97.3 F (36.3 C) (Temporal)   Ht 5\' 7"  (1.702 m)   Wt 155 lb (70.3 kg)   SpO2 100%   BMI 24.28 kg/m  Wt Readings from Last 3 Encounters:  02/13/20 155 lb (70.3 kg)  02/01/20 154 lb 15.7 oz (70.3 kg)  01/29/20 160 lb (72.6 kg)     Health Maintenance Due  Topic Date Due  . FOOT EXAM  Never done  . OPHTHALMOLOGY EXAM  Never done  . URINE MICROALBUMIN  Never done  . COVID-19 Vaccine (1) Never done    There are no preventive care reminders to display for this patient.  Lab Results  Component Value Date   TSH 1.67 06/28/2014   Lab Results  Component Value Date   WBC 9.5 02/01/2020   HGB 14.7 02/01/2020   HCT 44.7 02/01/2020   MCV 95.7 02/01/2020   PLT 178 02/01/2020   Lab Results  Component Value Date   NA 130 (L) 02/01/2020   K 4.8 02/01/2020   CO2 18 (L) 02/01/2020   GLUCOSE 351 (H) 02/01/2020   BUN 48 (H) 02/01/2020   CREATININE 10.14 (H) 02/01/2020   BILITOT 0.9 01/30/2020   ALKPHOS 140 (H) 01/30/2020   AST 16 01/30/2020   ALT 16 01/30/2020   PROT 6.7 01/30/2020   ALBUMIN 3.0 (L) 02/01/2020   CALCIUM 7.6 (L) 02/01/2020   ANIONGAP 18 (H) 02/01/2020   No results found for: CHOL No results found for:  HDL No results found for: LDLCALC No results found for: TRIG No results found for: [facility]  Results  Component Value Date   HGBA1C 8.9 (H) 01/31/2020      Assessment & Plan:  Virgilio was seen today for hospitalization follow-up and fall.  Diagnoses and all orders for this visit:  Type 1 diabetes mellitus with other circulatory complication ([name]) Type 1 Diabetes in adults, treatment is insulin the only option. Your pancrease makes little to no insulin. Your  target glucose range is 70 to 180 mg/dL, while  minimizing time in hypoglycemia 70 mg/dL  and hyperglycemia  180 mg/dL  and avoiding glucose readings of 250 mg/dLpercent in type 1 diabetes are 144, 140, and 154 mg/dL fasting, pre-meals, and  pre-bed, respectively . Recommended monitoring your blood sugars ,exercising diet management -     Glucose (CBG), Fasting -     insulin glargine (LANTUS SOLOSTAR) 100 UNIT/ML Solostar Pen; Inject 12 Units into the skin daily.  Hypotension, unspecified hypotension type Tried hydration in office 64 oz of fluid orally Bp dropped further underlying dehydration and inability to keep anything down.  ESRD on dialysis [facility]) Followed by nephrology   Diabetic foot infection ([place]) Follow up with podiatry today.  Patient advised to go to hospital via 911 refused signed AMA No orders of the defined types were placed in this encounter.   Follow-up: No follow-ups on file.    Kerin Perna, NP

## 2020-02-13 NOTE — Patient Instructions (Signed)
Dehydration, Adult °Dehydration is condition in which there is not enough water or other fluids in the body. This happens when a person loses more fluids than he or she takes in. Important body parts cannot work right without the right amount of fluids. Any loss of fluids from the body can cause dehydration. °Dehydration can be mild, worse, or very bad. It should be treated right away to keep it from getting very bad. °What are the causes? °This condition may be caused by: °· Conditions that cause loss of water or other fluids, such as: °? Watery poop (diarrhea). °? Vomiting. °? Sweating a lot. °? Peeing (urinating) a lot. °· Not drinking enough fluids, especially when you: °? Are ill. °? Are doing things that take a lot of energy to do. °· Other illnesses and conditions, such as fever or infection. °· Certain medicines, such as medicines that take extra fluid out of the body (diuretics). °· Lack of safe drinking water. °· Not being able to get enough water and food. °What increases the risk? °The following factors may make you more likely to develop this condition: °· Having a long-term (chronic) illness that has not been treated the right way, such as: °? Diabetes. °? Heart disease. °? Kidney disease. °· Being 65 years of age or older. °· Having a disability. °· Living in a place that is high above the ground or sea (high in altitude). The thinner, dried air causes more fluid loss. °· Doing exercises that put stress on your body for a long time. °What are the signs or symptoms? °Symptoms of dehydration depend on how bad it is. °Mild or worse dehydration °· Thirst. °· Dry lips or dry mouth. °· Feeling dizzy or light-headed, especially when you stand up from sitting. °· Muscle cramps. °· Your body making: °? Dark pee (urine). Pee may be the color of tea. °? Less pee than normal. °? Less tears than normal. °· Headache. °Very bad dehydration °· Changes in skin. Skin may: °? Be cold to the touch (clammy). °? Be blotchy  or pale. °? Not go back to normal right after you lightly pinch it and let it go. °· Little or no tears, pee, or sweat. °· Changes in vital signs, such as: °? Fast breathing. °? Low blood pressure. °? Weak pulse. °? Pulse that is more than 100 beats a minute when you are sitting still. °· Other changes, such as: °? Feeling very thirsty. °? Eyes that look hollow (sunken). °? Cold hands and feet. °? Being mixed up (confused). °? Being very tired (lethargic) or having trouble waking from sleep. °? Short-term weight loss. °? Loss of consciousness. °How is this treated? °Treatment for this condition depends on how bad it is. Treatment should start right away. Do not wait until your condition gets very bad. Very bad dehydration is an emergency. You will need to go to a hospital. °· Mild or worse dehydration can be treated at home. You may be asked to: °? Drink more fluids. °? Drink an oral rehydration solution (ORS). This drink helps get the right amounts of fluids and salts and minerals in the blood (electrolytes). °· Very bad dehydration can be treated: °? With fluids through an IV tube. °? By getting normal levels of salts and minerals in your blood. This is often done by giving salts and minerals through a tube. The tube is passed through your nose and into your stomach. °? By treating the root cause. °Follow these instructions at   home: °Oral rehydration solution °If told by your doctor, drink an ORS: °· Make an ORS. Use instructions on the package. °· Start by drinking small amounts, about ½ cup (120 mL) every 5-10 minutes. °· Slowly drink more until you have had the amount that your doctor said to have. °Eating and drinking ° °  ° °  ° °· Drink enough clear fluid to keep your pee pale yellow. If you were told to drink an ORS, finish the ORS first. Then, start slowly drinking other clear fluids. Drink fluids such as: °? Water. Do not drink only water. Doing that can make the salt (sodium) level in your body get too  low. °? Water from ice chips you suck on. °? Fruit juice that you have added water to (diluted). °? Low-calorie sports drinks. °· Eat foods that have the right amounts of salts and minerals, such as: °? Bananas. °? Oranges. °? Potatoes. °? Tomatoes. °? Spinach. °· Do not drink alcohol. °· Avoid: °? Drinks that have a lot of sugar. These include: °§ High-calorie sports drinks. °§ Fruit juice that you did not add water to. °§ Soda. °§ Caffeine. °? Foods that are greasy or have a lot of fat or sugar. °General instructions °· Take over-the-counter and prescription medicines only as told by your doctor. °· Do not take salt tablets. Doing that can make the salt level in your body get too high. °· Return to your normal activities as told by your doctor. Ask your doctor what activities are safe for you. °· Keep all follow-up visits as told by your doctor. This is important. °Contact a doctor if: °· You have pain in your belly (abdomen) and the pain: °? Gets worse. °? Stays in one place. °· You have a rash. °· You have a stiff neck. °· You get angry or annoyed (irritable) more easily than normal. °· You are more tired or have a harder time waking than normal. °· You feel: °? Weak or dizzy. °? Very thirsty. °Get help right away if you have: °· Any symptoms of very bad dehydration. °· Symptoms of vomiting, such as: °? You cannot eat or drink without vomiting. °? Your vomiting gets worse or does not go away. °? Your vomit has blood or green stuff in it. °· Symptoms that get worse with treatment. °· A fever. °· A very bad headache. °· Problems with peeing or pooping (having a bowel movement), such as: °? Watery poop that gets worse or does not go away. °? Blood in your poop (stool). This may cause poop to look black and tarry. °? Not peeing in 6-8 hours. °? Peeing only a small amount of very dark pee in 6-8 hours. °· Trouble breathing. °These symptoms may be an emergency. Do not wait to see if the symptoms will go away. Get  medical help right away. Call your local emergency services (911 in the [place] Do not drive yourself to the hospital. °Summary °· Dehydration is a condition in which there is not enough water or other fluids in the body. This happens when a person loses more fluids than he or she takes in. °· Treatment for this condition depends on how bad it is. Treatment should be started right away. Do not wait until your condition gets very bad. °· Drink enough clear fluid to keep your pee pale yellow. If you were told to drink an oral rehydration solution (ORS), finish the ORS first. Then, start slowly drinking other clear fluids. °·   Take over-the-counter and prescription medicines only as told by your doctor. °· Get help right away if you have any symptoms of very bad dehydration. °This information is not intended to replace advice given to you by your health care provider. Make sure you discuss any questions you have with your health care provider. °Document Revised: 04/27/2019 Document Reviewed: 04/27/2019 °Elsevier Patient Education © 2020 [employer] ° °

## 2020-02-13 NOTE — Progress Notes (Signed)
Subjective: [name] is a 39 y.o. male patient seen in office for evaluation of ulceration of the left foot.  Patient reports that he does not feel good has been vomiting and unable to keep his antibiotics down, went to PCP earlier today who advised him to go to ER after he sees me today. Denies fever or chills but does state that he hurts all over, has been complaint with going to HD despite how he feels.  Patient has no other pedal complaints at this time.  Patient Active Problem List   Diagnosis Date Noted  . Cellulitis of left foot 01/31/2020  . Allergy, unspecified, initial encounter 07/17/2019  . Diarrhea 04/24/2019  . Bone erosion determined by x-ray   . Encephalopathy acute   . 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 Open wound of left foot   . Encounter for orthopedic aftercare following surgical amputation   . Foot infection   . Gangrene of toe of left foot ([place])   . Cellulitis 10/26/2018  . Diabetic ulcer of left midfoot associated with diabetes mellitus due to underlying condition, with bone involvement without evidence of necrosis ([place])   . Nausea vomiting and diarrhea 10/20/2018  . Septic arthritis of left foot ([place])   . DM (diabetes mellitus), secondary, uncontrolled, with neurologic complications ([place])   . Headache 09/23/2018  . Osteomyelitis of foot, left, acute ([place])   . Acute osteomyelitis involving ankle and foot, left ([place])   . Osteomyelitis of fifth toe of left foot ([place])   . Anemia of chronic disease 08/18/2018  . Encounter for immunization 06/29/2018  . Hyperkalemia   . Unspecified protein-calorie malnutrition ([place]) 02/14/2018  . Encounter for removal of sutures 02/07/2018  . Osteomyelitis ([place]) 02/01/2018  . Fluid overload, unspecified 01/22/2018  . Osteomyelitis of ankle or foot, acute, right ([place]) 10/30/2017  . Cellulitis and abscess of toe of left foot   . Diabetic  foot infection ([place])   . Insulin dependent diabetes mellitus 10/25/2017  . Diabetic gastroparesis ([place]) 10/25/2017  . Sepsis ([place]) 10/25/2017  . Fever, unspecified 10/28/2016  . Iron deficiency anemia, unspecified 10/28/2016  . 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 Pruritus, unspecified 10/28/2016  . Secondary hyperparathyroidism of renal origin ([place]) 10/28/2016  . Shortness of breath 10/28/2016  . HTN (hypertension) 08/09/2014  . ESRD on dialysis ([place]) 03/19/2014  . MSSA bacteremia 05/31/2013  . Metabolic acidosis 96/22/2979   Current Outpatient Medications on File Prior to Visit  Medication Sig Dispense Refill  . calcitRIOL (ROCALTROL) 0.5 MCG capsule Take 5 capsules (2.5 mcg total) by mouth every Monday, Wednesday, and Friday with hemodialysis. 30 capsule 0  . ciprofloxacin (CIPRO) 500 MG tablet Take 1 tablet (500 mg total) by mouth daily with breakfast for 14 days. 14 tablet 0  . insulin glargine (LANTUS SOLOSTAR) 100 UNIT/ML Solostar Pen Inject 12 Units into the skin daily. 5 pen PRN  . LOPERAMIDE HCL PO Take by mouth.    Marland Kitchen NOVOLOG 100 UNIT/ML injection INJECT 6 TO 7 UNITS SUBCUTANEOUSLY TWICE DAILY WITH A MEAL (Patient taking differently: Inject 7 Units into the skin 3 (three) times daily with meals. ) 10 mL 0  . sevelamer carbonate (RENVELA) 800 MG tablet Take 2,400-4,000 mg by mouth See admin instructions. Take 5 tablets with meals then take 3 tablets with snacks     No current facility-administered medications on file prior to visit.  No Known Allergies  Recent Results (from the past 2160 hour(s))  House Account Tracking only     Status: None   Collection Time: 12/12/19 11:00 AM  Result Value Ref Range   Tracking House Account      Comment: We were unable to identify an account number for the order submitted. If you do not have a [facility]  account number or if your account information needs  to be updated please call [phone]  [phone]) for assistance. . To prevent delays in testing and processing of your orders please provide the following information for this order and with every additional order submitted: [facility] account number and account name  Client address Client phone and fax number NPI number of ordering physician along with the physician name.   WOUND CULTURE     Status: None   Collection Time: 12/12/19 11:00 AM  Result Value Ref Range   MICRO NUMBER: [id number]    SPECIMEN QUALITY: Adequate    SOURCE: R BALL OF THE FOOT ULCER    STATUS: FINAL    GRAM STAIN:      No white blood cells seen No epithelial cells seen Moderate Gram negative bacilli   RESULT:      A mix of non-predominating organisms of questionable significance was recovered on culture and not further identified. (Note: Growth did not detect the presence of S.aureus, beta-hemolytic Streptococci or P.aeruginosa).  Lactic acid, plasma     Status: None   Collection Time: 01/27/20  5:43 PM  Result Value Ref Range   Lactic Acid, Venous 1.9 0.5 - 1.9 mmol/L    Comment: Performed at [facility], [place] [address] [place] [address] [place], [place] [address]  Comprehensive metabolic panel     Status: Abnormal   Collection Time: 01/27/20  5:43 PM  Result Value Ref Range   Sodium 136 135 - 145 mmol/L   Potassium 4.8 3.5 - 5.1 mmol/L   Chloride 95 (L) 98 - 111 mmol/L   CO2 27 22 - 32 mmol/L   Glucose, Bld 93 70 - 99 mg/dL    Comment: Glucose reference range applies only to samples taken after fasting for at least 8 hours.   BUN 44 (H) 6 - 20 mg/dL   Creatinine, Ser 9.91 (H) 0.61 - 1.24 mg/dL   Calcium 8.9 8.9 - 10.3 mg/dL   Total Protein 7.0 6.5 - 8.1 g/dL   Albumin 3.2 (L) 3.5 - 5.0 g/dL   AST 27 15 - 41 U/L   ALT 22 0 - 44 U/L   Alkaline Phosphatase 139 (H) 38 - 126 U/L   Total Bilirubin 1.0 0.3 - 1.2 mg/dL   GFR calc non Af Amer 6 (L) >60 mL/min   GFR calc Af Amer 7 (L) >60 mL/min   Anion gap 14 5 - 15    Comment: Performed at [facility] [address] [place], [place] [address]  CBC with Differential     Status: Abnormal   Collection Time: 01/27/20  5:43 PM  Result Value Ref Range   WBC 10.5 4.0 - 10.5 K/uL   RBC 5.13 4.22 - 5.81 MIL/uL   Hemoglobin 15.8 13.0 - 17.0 g/dL   HCT 49.4 39.0 - 52.0 %   MCV 96.3 80.0 - 100.0 fL   MCH 30.8 26.0 - 34.0 pg   MCHC 32.0 30.0 - 36.0 g/dL   RDW 12.4 11.5 - 15.5 %   Platelets 190 150 - 400 K/uL  nRBC 0.0 0.0 - 0.2 %   Neutrophils Relative % 67 %   Neutro Abs 7.0 1.7 - 7.7 K/uL   Lymphocytes Relative 16 %   Lymphs Abs 1.7 0.7 - 4.0 K/uL   Monocytes Relative 9 %   Monocytes Absolute 1.0 0.1 - 1.0 K/uL   Eosinophils Relative 6 %   Eosinophils Absolute 0.7 (H) 0.0 - 0.5 K/uL   Basophils Relative 1 %   Basophils Absolute 0.1 0.0 - 0.1 K/uL   Immature Granulocytes 1 %   Abs Immature Granulocytes 0.07 0.00 - 0.07 K/uL    Comment: Performed at [facility] [address] [place], [place] [address]  CBG monitoring, ED     Status: Abnormal   Collection Time: 01/27/20 10:11 PM  Result Value Ref Range   Glucose-Capillary 123 (H) 70 - 99 mg/dL    Comment: Glucose reference range applies only to samples taken after fasting for at least 8 hours.  Glucose, capillary     Status: Abnormal   Collection Time: 01/29/20 12:19 PM  Result Value Ref Range   Glucose-Capillary 127 (H) 70 - 99 mg/dL    Comment: Glucose reference range applies only to samples taken after fasting for at least 8 hours.   Comment 1 Notify RN    Comment 2 Document in Chart   Basic metabolic panel     Status: Abnormal   Collection Time: 01/29/20  1:04 PM  Result Value Ref Range   Sodium 132 (L) 135 - 145 mmol/L   Potassium 7.1 (HH) 3.5 - 5.1 mmol/L    Comment: CRITICAL RESULT CALLED TO, READ BACK BY AND VERIFIED WITH: Rolan Bucco RN [phone] [facility]    Chloride 98 98 - 111 mmol/L   CO2 21 (L) 22 - 32 mmol/L   Glucose, Bld 163 (H) 70 - 99 mg/dL    Comment: Glucose reference  range applies only to samples taken after fasting for at least 8 hours.   BUN 72 (H) 6 - 20 mg/dL   Creatinine, Ser 13.00 (H) 0.61 - 1.24 mg/dL   Calcium 8.2 (L) 8.9 - 10.3 mg/dL   GFR calc non Af Amer 4 (L) >60 mL/min   GFR calc Af Amer 5 (L) >60 mL/min   Anion gap 13 5 - 15    Comment: Performed at [facility] [address] [place], [place] [address]  CBC with Differential     Status: Abnormal   Collection Time: 01/30/20  8:22 PM  Result Value Ref Range   WBC 10.9 (H) 4.0 - 10.5 K/uL   RBC 4.82 4.22 - 5.81 MIL/uL   Hemoglobin 14.9 13.0 - 17.0 g/dL   HCT 46.4 39.0 - 52.0 %   MCV 96.3 80.0 - 100.0 fL   MCH 30.9 26.0 - 34.0 pg   MCHC 32.1 30.0 - 36.0 g/dL   RDW 12.7 11.5 - 15.5 %   Platelets 252 150 - 400 K/uL   nRBC 0.0 0.0 - 0.2 %   Neutrophils Relative % 78 %   Neutro Abs 8.6 (H) 1.7 - 7.7 K/uL   Lymphocytes Relative 10 %   Lymphs Abs 1.1 0.7 - 4.0 K/uL   Monocytes Relative 6 %   Monocytes Absolute 0.6 0.1 - 1.0 K/uL   Eosinophils Relative 5 %   Eosinophils Absolute 0.5 0.0 - 0.5 K/uL   Basophils Relative 1 %   Basophils Absolute 0.1 0.0 - 0.1 K/uL   Immature  Granulocytes 0 %   Abs Immature Granulocytes 0.02 0.00 - 0.07 K/uL    Comment: Performed at [facility], [place] [address] [place], [place] [address]  Comprehensive metabolic panel     Status: Abnormal   Collection Time: 01/30/20  8:22 PM  Result Value Ref Range   Sodium 133 (L) 135 - 145 mmol/L   Potassium 6.7 (HH) 3.5 - 5.1 mmol/L    Comment: NO VISIBLE HEMOLYSIS CRITICAL RESULT CALLED TO, READ BACK BY AND VERIFIED WITH: Selena Lesser RN [phone]    Chloride 95 (L) 98 - 111 mmol/L   CO2 23 22 - 32 mmol/L   Glucose, Bld 206 (H) 70 - 99 mg/dL    Comment: Glucose reference range applies only to samples taken after fasting for at least 8 hours.   BUN 89 (H) 6 - 20 mg/dL   Creatinine, Ser 14.87 (H) 0.61 - 1.24 mg/dL   Calcium 8.2 (L) 8.9 - 10.3 mg/dL   Total Protein 6.7 6.5 - 8.1 g/dL    Albumin 3.3 (L) 3.5 - 5.0 g/dL   AST 16 15 - 41 U/L   ALT 16 0 - 44 U/L   Alkaline Phosphatase 140 (H) 38 - 126 U/L   Total Bilirubin 0.9 0.3 - 1.2 mg/dL   GFR calc non Af Amer 4 (L) >60 mL/min   GFR calc Af Amer 4 (L) >60 mL/min   Anion gap 15 5 - 15    Comment: Performed at [facility] [address], [place], [place] [address]  CBG monitoring, ED     Status: Abnormal   Collection Time: 01/30/20 10:00 PM  Result Value Ref Range   Glucose-Capillary 180 (H) 70 - 99 mg/dL    Comment: Glucose reference range applies only to samples taken after fasting for at least 8 hours.  CBC with Differential/Platelet     Status: None   Collection Time: 01/31/20  6:22 AM  Result Value Ref Range   WBC 8.5 4.0 - 10.5 K/uL   RBC 4.76 4.22 - 5.81 MIL/uL   Hemoglobin 14.7 13.0 - 17.0 g/dL   HCT 46.6 39.0 - 52.0 %   MCV 97.9 80.0 - 100.0 fL   MCH 30.9 26.0 - 34.0 pg   MCHC 31.5 30.0 - 36.0 g/dL   RDW 12.8 11.5 - 15.5 %   Platelets 205 150 - 400 K/uL   nRBC 0.0 0.0 - 0.2 %   Neutrophils Relative % 76 %   Neutro Abs 6.5 1.7 - 7.7 K/uL   Lymphocytes Relative 13 %   Lymphs Abs 1.1 0.7 - 4.0 K/uL   Monocytes Relative 5 %   Monocytes Absolute 0.4 0.1 - 1.0 K/uL   Eosinophils Relative 5 %   Eosinophils Absolute 0.5 0.0 - 0.5 K/uL   Basophils Relative 1 %   Basophils Absolute 0.0 0.0 - 0.1 K/uL   Immature Granulocytes 0 %   Abs Immature Granulocytes 0.02 0.00 - 0.07 K/uL    Comment: Performed at [facility], [address] [place], [place] [address]  Troponin I (High Sensitivity)     Status: None   Collection Time: 01/31/20  6:22 AM  Result Value Ref Range   Troponin I (High Sensitivity) 4 <18 ng/L    Comment: (NOTE) Elevated high sensitivity troponin I (hsTnI) values and significant  changes across serial measurements may suggest ACS but many other  chronic and acute conditions are known to elevate hsTnI  results.  Refer to the Links section for chest pain algorithms and  additional  guidance. Performed at [facility], [place] [address] [place], [place] [address]   Hemoglobin A1c     Status: Abnormal   Collection Time: 01/31/20  6:22 AM  Result Value Ref Range   Hgb A1c MFr Bld 8.9 (H) 4.8 - 5.6 %    Comment: (NOTE) Pre diabetes:          5.7%-6.4% Diabetes:              >6.4% Glycemic control for   <7.0% adults with diabetes    Mean Plasma Glucose 208.73 mg/dL    Comment: Performed at [facility] [address], [place], [place] [address]  Glucose, capillary     Status: Abnormal   Collection Time: 01/31/20  6:27 AM  Result Value Ref Range   Glucose-Capillary 459 (H) 70 - 99 mg/dL    Comment: Glucose reference range applies only to samples taken after fasting for at least 8 hours.   Comment 1 Notify RN    Comment 2 Document in Chart   Respiratory Panel by RT PCR (Flu A&B, Covid) - Nasopharyngeal Swab     Status: None   Collection Time: 01/31/20  9:10 AM   Specimen: Nasopharyngeal Swab  Result Value Ref Range   SARS Coronavirus 2 by RT PCR NEGATIVE NEGATIVE    Comment: (NOTE) SARS-CoV-2 target nucleic acids are NOT DETECTED. The SARS-CoV-2 RNA is generally detectable in upper respiratoy specimens during the acute phase of infection. The lowest concentration of SARS-CoV-2 viral copies this assay can detect is 131 copies/mL. A negative result does not preclude SARS-Cov-2 infection and should not be used as the sole basis for treatment or other patient management decisions. A negative result may occur with  improper specimen collection/handling, submission of specimen other than nasopharyngeal swab, presence of viral mutation(s) within the areas targeted by this assay, and inadequate number of viral copies (<131 copies/mL). A negative result must be combined with clinical observations, patient history, and epidemiological information. The expected result is Negative. Fact Sheet for Patients:   PinkCheek.be Fact Sheet for Healthcare Providers:  GravelBags.it This test is not yet ap proved or cleared by the [place] FDA and  has been authorized for detection and/or diagnosis of SARS-CoV-2 by FDA under an Emergency Use Authorization (EUA). This EUA will remain  in effect (meaning this test can be used) for the duration of the COVID-19 declaration under Section 564(b)(1) of the Act, 21 U.S.C. section 360bbb-3(b)(1), unless the authorization is terminated or revoked sooner.    Influenza A by PCR NEGATIVE NEGATIVE   Influenza B by PCR NEGATIVE NEGATIVE    Comment: (NOTE) The Xpert Xpress SARS-CoV-2/FLU/RSV assay is intended as an aid in  the diagnosis of influenza from Nasopharyngeal swab specimens and  should not be used as a sole basis for treatment. Nasal washings and  aspirates are unacceptable for Xpert Xpress SARS-CoV-2/FLU/RSV  testing. Fact Sheet for Patients: PinkCheek.be Fact Sheet for Healthcare Providers: GravelBags.it This test is not yet approved or cleared by the [place] FDA and  has been authorized for detection and/or diagnosis of SARS-CoV-2 by  FDA under an Emergency Use Authorization (EUA). This EUA will remain  in effect (meaning this test can be used) for the duration of the  Covid-19 declaration under Section 564(b)(1) of the Act, 21  U.S.C. section 360bbb-3(b)(1), unless the authorization is  terminated or revoked. Performed at  [facility], [place] [address], [place], [place] [address]   Glucose, capillary     Status: Abnormal   Collection Time: 01/31/20 10:05 AM  Result Value Ref Range   Glucose-Capillary 167 (H) 70 - 99 mg/dL    Comment: Glucose reference range applies only to samples taken after fasting for at least 8 hours.  Glucose, capillary     Status: Abnormal   Collection Time: 01/31/20 12:30 PM  Result Value  Ref Range   Glucose-Capillary 164 (H) 70 - 99 mg/dL    Comment: Glucose reference range applies only to samples taken after fasting for at least 8 hours.   Comment 1 Notify RN    Comment 2 Document in Chart   Glucose, capillary     Status: Abnormal   Collection Time: 01/31/20  7:27 PM  Result Value Ref Range   Glucose-Capillary 304 (H) 70 - 99 mg/dL    Comment: Glucose reference range applies only to samples taken after fasting for at least 8 hours.  CBC     Status: None   Collection Time: 02/01/20  4:36 AM  Result Value Ref Range   WBC 9.5 4.0 - 10.5 K/uL   RBC 4.67 4.22 - 5.81 MIL/uL   Hemoglobin 14.7 13.0 - 17.0 g/dL   HCT 44.7 39.0 - 52.0 %   MCV 95.7 80.0 - 100.0 fL   MCH 31.5 26.0 - 34.0 pg   MCHC 32.9 30.0 - 36.0 g/dL   RDW 12.9 11.5 - 15.5 %   Platelets 178 150 - 400 K/uL   nRBC 0.0 0.0 - 0.2 %    Comment: Performed at [facility], [place] [address], [place], [place] [address]  Renal function panel     Status: Abnormal   Collection Time: 02/01/20  4:36 AM  Result Value Ref Range   Sodium 130 (L) 135 - 145 mmol/L   Potassium 4.8 3.5 - 5.1 mmol/L   Chloride 94 (L) 98 - 111 mmol/L   CO2 18 (L) 22 - 32 mmol/L   Glucose, Bld 351 (H) 70 - 99 mg/dL    Comment: Glucose reference range applies only to samples taken after fasting for at least 8 hours.   BUN 48 (H) 6 - 20 mg/dL   Creatinine, Ser 10.14 (H) 0.61 - 1.24 mg/dL   Calcium 7.6 (L) 8.9 - 10.3 mg/dL   Phosphorus 3.6 2.5 - 4.6 mg/dL   Albumin 3.0 (L) 3.5 - 5.0 g/dL   GFR calc non Af Amer 6 (L) >60 mL/min   GFR calc Af Amer 7 (L) >60 mL/min   Anion gap 18 (H) 5 - 15    Comment: Performed at [facility] [address], [place], [place] [address]  Glucose (CBG), Fasting     Status: Abnormal   Collection Time: 02/13/20 11:15 AM  Result Value Ref Range   Glucose Fasting, POC 179 (A) 70 - 99 mg/dL  Lipase, blood     Status: None   Collection Time: 02/13/20  6:32 PM  Result Value Ref Range   Lipase 20 11 - 51  U/L    Comment: Performed at [facility], [place] [address], [place], [place] [address]  Comprehensive metabolic panel     Status: Abnormal   Collection Time: 02/13/20  6:32 PM  Result Value Ref Range   Sodium 129 (L) 135 - 145 mmol/L   Potassium 4.7 3.5 - 5.1 mmol/L   Chloride 90 (L) 98 -  111 mmol/L   CO2 26 22 - 32 mmol/L   Glucose, Bld 98 70 - 99 mg/dL    Comment: Glucose reference range applies only to samples taken after fasting for at least 8 hours.   BUN 37 (H) 6 - 20 mg/dL   Creatinine, Ser 12.70 (H) 0.61 - 1.24 mg/dL   Calcium 7.7 (L) 8.9 - 10.3 mg/dL   Total Protein 6.4 (L) 6.5 - 8.1 g/dL   Albumin 2.8 (L) 3.5 - 5.0 g/dL   AST 27 15 - 41 U/L   ALT 15 0 - 44 U/L   Alkaline Phosphatase 87 38 - 126 U/L   Total Bilirubin 1.0 0.3 - 1.2 mg/dL   GFR calc non Af Amer 4 (L) >60 mL/min   GFR calc Af Amer 5 (L) >60 mL/min   Anion gap 13 5 - 15    Comment: Performed at [facility] [address], [place], [place] [address]  CBC     Status: Abnormal   Collection Time: 02/13/20  6:32 PM  Result Value Ref Range   WBC 4.6 4.0 - 10.5 K/uL   RBC 4.21 (L) 4.22 - 5.81 MIL/uL   Hemoglobin 12.8 (L) 13.0 - 17.0 g/dL   HCT 40.5 39.0 - 52.0 %   MCV 96.2 80.0 - 100.0 fL   MCH 30.4 26.0 - 34.0 pg   MCHC 31.6 30.0 - 36.0 g/dL   RDW 13.8 11.5 - 15.5 %   Platelets 111 (L) 150 - 400 K/uL    Comment: Immature Platelet Fraction may be clinically indicated, consider ordering this additional test [id number] PLATELET COUNT CONFIRMED BY SMEAR REPEATED TO VERIFY    nRBC 0.4 (H) 0.0 - 0.2 %    Comment: Performed at [facility], [place] [address] [place], [place] [address]    Objective: There were no vitals filed for this visit.  General: Patient is awake, alert, oriented x 3 and in no acute distress but is noted to be drifting off and closing eyes during my encounter and saying that he doesn't feel well  Dermatology: Skin is warm and dry bilateral with a full thickness  ulceration present plantar lateral left foot. Ulceration measures 2.5 cm x 4cm x 0.3 cm.  With minimally keratotic border with 100% granular base. The ulceration does not probe to bone. There is no malodor, minimal clear active drainage, minimal  edema. No erythema. No other acute signs of infection noted from the foot.   Vascular: Dorsalis Pedis pulse =1 /4 Bilateral,  Posterior Tibial pulse = 1/4 Bilateral,  Capillary Fill Time < 5 seconds  Neurologic: Protective sensation absent bilateral.  Musculosketal: + deformity with history of multiple digital amputations   Recent Labs    04/24/19 0915 04/24/19 1939  GRAMSTAIN  --  NO WBC SEEN FEW GRAM POSITIVE COCCI   LABORGA STAPHYLOCOCCUS AUREUS STAPHYLOCOCCUS AUREUS  ENTEROCOCCUS FAECALIS    Assessment and Plan:  Problem List Items Addressed This Visit    None    Visit Diagnoses    Ulcer of left foot, limited to breakdown of skin ([place])    -  Primary   Diabetic polyneuropathy associated with type 2 diabetes mellitus ([place])       History of amputation       History of osteomyelitis         -Re-Examined patient and discussed the progression of the wound and treatment alternatives. -Cleanse ulceration there was not much buildup of nonviable tissue at this  visit so no debridement was performed again at this visit, was debrided 4 days ago by Dr. March Rummage -Applied antibiotic cream and dry sterile dressing and instructed patient to return to office on Thursday for another dressing change with Dr. March Rummage until we can get his home nursing set up if he does not get admitted to hospital -Advised patient at this time due to emesis should report to ER. I called and spoke with Triage nurse to expect his arrival. At this time it does not appear to be related to foot that is making him feel this way but due to extensive history can not be 582% certain since he has been unable to take his antibiotics by mouth of cipro since the weekend because of emesis   -Advised patient continue with cam boot to offload ulcer on left -Patient to return to office for follow up care with Dr. March Rummage later this week or sooner if problems or issues arise.  Landis Martins, DPM

## 2020-02-13 NOTE — Progress Notes (Signed)
Unable to eat- has not ate in 4 days  Pt does not feel the pain in his neck should still be present. He is worried about the pain

## 2020-02-14 ENCOUNTER — Emergency Department (HOSPITAL_COMMUNITY): Payer: Medicare Other

## 2020-02-14 ENCOUNTER — Telehealth: Payer: Self-pay | Admitting: Podiatry

## 2020-02-14 ENCOUNTER — Encounter (HOSPITAL_COMMUNITY): Payer: Self-pay | Admitting: Internal Medicine

## 2020-02-14 DIAGNOSIS — Z89412 Acquired absence of left great toe: Secondary | ICD-10-CM | POA: Diagnosis not present

## 2020-02-14 DIAGNOSIS — D696 Thrombocytopenia, unspecified: Secondary | ICD-10-CM | POA: Diagnosis present

## 2020-02-14 DIAGNOSIS — E875 Hyperkalemia: Secondary | ICD-10-CM | POA: Diagnosis present

## 2020-02-14 DIAGNOSIS — Z833 Family history of diabetes mellitus: Secondary | ICD-10-CM | POA: Diagnosis not present

## 2020-02-14 DIAGNOSIS — D631 Anemia in chronic kidney disease: Secondary | ICD-10-CM | POA: Diagnosis present

## 2020-02-14 DIAGNOSIS — Z8619 Personal history of other infectious and parasitic diseases: Secondary | ICD-10-CM | POA: Diagnosis not present

## 2020-02-14 DIAGNOSIS — K3184 Gastroparesis: Secondary | ICD-10-CM | POA: Diagnosis present

## 2020-02-14 DIAGNOSIS — N186 End stage renal disease: Secondary | ICD-10-CM | POA: Diagnosis present

## 2020-02-14 DIAGNOSIS — E1143 Type 2 diabetes mellitus with diabetic autonomic (poly)neuropathy: Secondary | ICD-10-CM | POA: Diagnosis not present

## 2020-02-14 DIAGNOSIS — I10 Essential (primary) hypertension: Secondary | ICD-10-CM | POA: Diagnosis not present

## 2020-02-14 DIAGNOSIS — Z992 Dependence on renal dialysis: Secondary | ICD-10-CM | POA: Diagnosis not present

## 2020-02-14 DIAGNOSIS — M14672 Charcot's joint, left ankle and foot: Secondary | ICD-10-CM | POA: Diagnosis present

## 2020-02-14 DIAGNOSIS — U071 COVID-19: Secondary | ICD-10-CM | POA: Diagnosis present

## 2020-02-14 DIAGNOSIS — E10621 Type 1 diabetes mellitus with foot ulcer: Secondary | ICD-10-CM | POA: Diagnosis present

## 2020-02-14 DIAGNOSIS — Z89421 Acquired absence of other right toe(s): Secondary | ICD-10-CM | POA: Diagnosis not present

## 2020-02-14 DIAGNOSIS — I120 Hypertensive chronic kidney disease with stage 5 chronic kidney disease or end stage renal disease: Secondary | ICD-10-CM | POA: Diagnosis present

## 2020-02-14 DIAGNOSIS — L97422 Non-pressure chronic ulcer of left heel and midfoot with fat layer exposed: Secondary | ICD-10-CM | POA: Diagnosis present

## 2020-02-14 DIAGNOSIS — E10649 Type 1 diabetes mellitus with hypoglycemia without coma: Secondary | ICD-10-CM | POA: Diagnosis not present

## 2020-02-14 DIAGNOSIS — E1043 Type 1 diabetes mellitus with diabetic autonomic (poly)neuropathy: Secondary | ICD-10-CM | POA: Diagnosis present

## 2020-02-14 DIAGNOSIS — K529 Noninfective gastroenteritis and colitis, unspecified: Secondary | ICD-10-CM | POA: Diagnosis present

## 2020-02-14 DIAGNOSIS — Z841 Family history of disorders of kidney and ureter: Secondary | ICD-10-CM | POA: Diagnosis not present

## 2020-02-14 DIAGNOSIS — Z794 Long term (current) use of insulin: Secondary | ICD-10-CM | POA: Diagnosis not present

## 2020-02-14 DIAGNOSIS — E11628 Type 2 diabetes mellitus with other skin complications: Secondary | ICD-10-CM | POA: Diagnosis not present

## 2020-02-14 DIAGNOSIS — N2581 Secondary hyperparathyroidism of renal origin: Secondary | ICD-10-CM | POA: Diagnosis present

## 2020-02-14 DIAGNOSIS — E1022 Type 1 diabetes mellitus with diabetic chronic kidney disease: Secondary | ICD-10-CM | POA: Diagnosis present

## 2020-02-14 DIAGNOSIS — E871 Hypo-osmolality and hyponatremia: Secondary | ICD-10-CM | POA: Diagnosis not present

## 2020-02-14 DIAGNOSIS — J1282 Pneumonia due to coronavirus disease 2019: Secondary | ICD-10-CM | POA: Diagnosis present

## 2020-02-14 LAB — CBC
HCT: 46.4 % (ref 39.0–52.0)
Hemoglobin: 14.9 g/dL (ref 13.0–17.0)
MCH: 30.8 pg (ref 26.0–34.0)
MCHC: 32.1 g/dL (ref 30.0–36.0)
MCV: 95.9 fL (ref 80.0–100.0)
Platelets: 127 10*3/uL — ABNORMAL LOW (ref 150–400)
RBC: 4.84 MIL/uL (ref 4.22–5.81)
RDW: 13.7 % (ref 11.5–15.5)
WBC: 4.4 10*3/uL (ref 4.0–10.5)
nRBC: 0.5 % — ABNORMAL HIGH (ref 0.0–0.2)

## 2020-02-14 LAB — GLUCOSE, CAPILLARY
Glucose-Capillary: 292 mg/dL — ABNORMAL HIGH (ref 70–99)
Glucose-Capillary: 250 mg/dL — ABNORMAL HIGH (ref 70–99)
Glucose-Capillary: 216 mg/dL — ABNORMAL HIGH (ref 70–99)

## 2020-02-14 LAB — SARS CORONAVIRUS 2 BY RT PCR (HOSPITAL ORDER, PERFORMED IN ~~LOC~~ HOSPITAL LAB): SARS Coronavirus 2: POSITIVE — AB

## 2020-02-14 LAB — ABO/RH: ABO/RH(D): B POS

## 2020-02-14 LAB — CREATININE, SERUM
Creatinine, Ser: 14.09 mg/dL — ABNORMAL HIGH (ref 0.61–1.24)
GFR calc Af Amer: 4 mL/min — ABNORMAL LOW (ref >60)
GFR calc non Af Amer: 4 mL/min — ABNORMAL LOW (ref >60)

## 2020-02-14 LAB — PROCALCITONIN: Procalcitonin: 13.16 ng/mL

## 2020-02-14 LAB — FERRITIN: Ferritin: 2863 ng/mL — ABNORMAL HIGH (ref 24–336)

## 2020-02-14 LAB — LACTIC ACID, PLASMA: Lactic Acid, Venous: 1.2 mmol/L (ref 0.5–1.9)

## 2020-02-14 LAB — D-DIMER, QUANTITATIVE: D-Dimer, Quant: 1.56 ug/mL-FEU — ABNORMAL HIGH (ref 0.00–0.50)

## 2020-02-14 LAB — C-REACTIVE PROTEIN: CRP: 10.1 mg/dL — ABNORMAL HIGH (ref <1.0)

## 2020-02-14 MED ORDER — HYDROMORPHONE HCL 1 MG/ML IJ SOLN
1.0000 mg | Freq: Once | INTRAMUSCULAR | Status: AC
  Administered 2020-02-14: 1 mg via INTRAMUSCULAR
  Filled 2020-02-14: qty 1

## 2020-02-14 MED ORDER — ONDANSETRON HCL 4 MG PO TABS
4.0000 mg | ORAL_TABLET | Freq: Four times a day (QID) | ORAL | Status: DC | PRN
  Administered 2020-02-18: 4 mg via ORAL
  Filled 2020-02-14: qty 1

## 2020-02-14 MED ORDER — OXYCODONE HCL 5 MG PO TABS
10.0000 mg | ORAL_TABLET | ORAL | Status: DC | PRN
  Administered 2020-02-14: 5 mg via ORAL
  Administered 2020-02-14 – 2020-02-18 (×14): 10 mg via ORAL
  Filled 2020-02-14 (×17): qty 2

## 2020-02-14 MED ORDER — ACETAMINOPHEN 325 MG PO TABS: 650.0000 mg | ORAL_TABLET | Freq: Four times a day (QID) | ORAL | Status: DC | PRN

## 2020-02-14 MED ORDER — OXYCODONE HCL 5 MG PO TABS
5.0000 mg | ORAL_TABLET | ORAL | Status: DC | PRN
  Administered 2020-02-14 (×2): 5 mg via ORAL
  Filled 2020-02-14 (×2): qty 1

## 2020-02-14 MED ORDER — ONDANSETRON HCL 4 MG/2ML IJ SOLN: 4.0000 mg | Freq: Four times a day (QID) | INTRAMUSCULAR | Status: DC | PRN

## 2020-02-14 MED ORDER — CHLORHEXIDINE GLUCONATE CLOTH 2 % EX PADS
6.0000 | MEDICATED_PAD | Freq: Every day | CUTANEOUS | Status: DC
  Administered 2020-02-15: 6 via TOPICAL

## 2020-02-14 MED ORDER — ALBUTEROL SULFATE HFA 108 (90 BASE) MCG/ACT IN AERS
2.0000 | INHALATION_SPRAY | Freq: Four times a day (QID) | RESPIRATORY_TRACT | Status: DC
  Administered 2020-02-14: 2 via RESPIRATORY_TRACT
  Filled 2020-02-14: qty 6.7

## 2020-02-14 MED ORDER — ACETAMINOPHEN 325 MG PO TABS
650.0000 mg | ORAL_TABLET | Freq: Once | ORAL | Status: AC
  Administered 2020-02-14: 650 mg via ORAL
  Filled 2020-02-14: qty 2

## 2020-02-14 MED ORDER — INSULIN GLARGINE 100 UNIT/ML ~~LOC~~ SOLN
12.0000 [IU] | Freq: Every day | SUBCUTANEOUS | Status: DC
  Administered 2020-02-14 – 2020-02-15 (×2): 12 [IU] via SUBCUTANEOUS
  Filled 2020-02-14 (×3): qty 0.12

## 2020-02-14 MED ORDER — SODIUM CHLORIDE 0.9 % IV SOLN
1.0000 g | INTRAVENOUS | Status: DC
  Administered 2020-02-14 – 2020-02-18 (×5): 1 g via INTRAVENOUS
  Filled 2020-02-14 (×5): qty 10

## 2020-02-14 MED ORDER — DEXAMETHASONE SODIUM PHOSPHATE 10 MG/ML IJ SOLN
6.0000 mg | Freq: Once | INTRAMUSCULAR | Status: AC
  Administered 2020-02-14: 6 mg via INTRAVENOUS
  Filled 2020-02-14: qty 1

## 2020-02-14 MED ORDER — METOCLOPRAMIDE HCL 5 MG/ML IJ SOLN
5.0000 mg | Freq: Four times a day (QID) | INTRAMUSCULAR | Status: DC
  Administered 2020-02-14 – 2020-02-15 (×4): 5 mg via INTRAVENOUS
  Filled 2020-02-14 (×4): qty 2

## 2020-02-14 MED ORDER — HEPARIN SODIUM (PORCINE) 5000 UNIT/ML IJ SOLN
5000.0000 [IU] | Freq: Three times a day (TID) | INTRAMUSCULAR | Status: DC
  Filled 2020-02-14 (×3): qty 1

## 2020-02-14 MED ORDER — SEVELAMER CARBONATE 800 MG PO TABS
4000.0000 mg | ORAL_TABLET | Freq: Three times a day (TID) | ORAL | Status: DC
  Administered 2020-02-14 – 2020-02-18 (×7): 4000 mg via ORAL
  Filled 2020-02-14 (×10): qty 5

## 2020-02-14 MED ORDER — HYDROMORPHONE HCL 1 MG/ML IJ SOLN
0.5000 mg | Freq: Once | INTRAMUSCULAR | Status: AC
  Administered 2020-02-14: 0.5 mg via INTRAVENOUS
  Filled 2020-02-14: qty 1

## 2020-02-14 MED ORDER — ONDANSETRON 4 MG PO TBDP
8.0000 mg | ORAL_TABLET | Freq: Once | ORAL | Status: AC
  Administered 2020-02-14: 4 mg via ORAL
  Filled 2020-02-14: qty 2

## 2020-02-14 MED ORDER — SEVELAMER CARBONATE 800 MG PO TABS: 2400.0000 mg | ORAL_TABLET | ORAL | Status: DC

## 2020-02-14 MED ORDER — INSULIN ASPART 100 UNIT/ML ~~LOC~~ SOLN
0.0000 [IU] | Freq: Three times a day (TID) | SUBCUTANEOUS | Status: DC
  Administered 2020-02-14: 5 [IU] via SUBCUTANEOUS
  Administered 2020-02-14: 3 [IU] via SUBCUTANEOUS
  Administered 2020-02-15: 2 [IU] via SUBCUTANEOUS

## 2020-02-14 MED ORDER — SODIUM CHLORIDE 0.9 % IV SOLN
200.0000 mg | Freq: Once | INTRAVENOUS | Status: AC
  Administered 2020-02-14: 200 mg via INTRAVENOUS
  Filled 2020-02-14: qty 40

## 2020-02-14 MED ORDER — SEVELAMER CARBONATE 800 MG PO TABS: 2400.0000 mg | ORAL_TABLET | Freq: Two times a day (BID) | ORAL | Status: DC | PRN

## 2020-02-14 MED ORDER — CALCITRIOL 0.5 MCG PO CAPS
2.5000 ug | ORAL_CAPSULE | ORAL | Status: DC
  Administered 2020-02-16: 2.5 ug via ORAL
  Filled 2020-02-14 (×2): qty 5

## 2020-02-14 MED ORDER — INSULIN ASPART 100 UNIT/ML ~~LOC~~ SOLN
7.0000 [IU] | Freq: Three times a day (TID) | SUBCUTANEOUS | Status: DC
  Administered 2020-02-14 – 2020-02-15 (×3): 7 [IU] via SUBCUTANEOUS

## 2020-02-14 MED ORDER — SODIUM CHLORIDE 0.9 % IV SOLN
100.0000 mg | Freq: Every day | INTRAVENOUS | Status: AC
  Administered 2020-02-15 – 2020-02-18 (×4): 100 mg via INTRAVENOUS
  Filled 2020-02-14 (×5): qty 20

## 2020-02-14 NOTE — Telephone Encounter (Signed)
Dr. Eber Jones called asking if you could stop by [name] and check on this patient. He said you could call his cell, [phone].

## 2020-02-14 NOTE — ED Notes (Signed)
Pt is now agreeable to have xray done. Xray made aware

## 2020-02-14 NOTE — Consult Note (Signed)
Renal Service Consult Note [name]  [name] 02/14/2020 [name] Requesting Physician: Dr. Roderic Palau  Reason for Consult:  ESRD pt w/ COVID infection HPI: The patient is a 39 y.o. year-old w/ hx of HTN, gastroparesis, DM2 and ESRD on HD presented to ED last night for nausea/ vomiting x 10 days. On abx for a foot ulcer, but unable to keep down so has not been taking. Also body aches. Last HD Monday. Some DOE which is new. In ED temp was 100.5, BP's stable. CXR showed bibasilar infiltrates, COVID test was +. Pt was admitted and started on IV remdesivir and Rocephin. No steroids as was not hypoxic.  We are asked to see for ESRD.   Pt was here 1-2 wks ago for hyperkalemia and clotted AVG.  Had temp cath placed, got HD and was dc'd to get OP declot same day at [facility].  He did get declot and access is working now.    Pt c/o gen'd pain.  No SOB , mild cough, no CP.    ROS  denies CP  no joint pain   no HA  no blurry vision  no rash   Past Medical History  Past Medical History:  Diagnosis Date  . Diabetes mellitus without complication ([place])   . Diabetic gastroparesis ([place])   . Dialysis patient ([place])   . Hypertension   . Renal disorder    Dialysis  . Sepsis [facility])    Past Surgical History  Past Surgical History:  Procedure Laterality Date  . AMPUTATION Right 02/02/2018   Procedure: RIGHT FIFTH TOE AND METATARSAL AMPUTATION. Filetted toe flap metatarsal resection. Debridement Plantar Foot wound;  Surgeon: Evelina Bucy, DPM;  Location: [place];  Service: Podiatry;  Laterality: Right;  . AMPUTATION Left 08/20/2018   Procedure: FIFTH METATARSAL BONE BIOPSY;  Surgeon: Evelina Bucy, DPM;  Location: [place];  Service: Podiatry;  Laterality: Left;  . AMPUTATION Left 10/28/2018   Procedure: LEFT GREAT TOE AMPUTATION;  Surgeon: Evelina Bucy, DPM;  Location: [place];  Service: Podiatry;  Laterality: Left;  . APPLICATION OF WOUND VAC  02/02/2018   Procedure:  APPLICATION OF WOUND VAC  Right Foot;  Surgeon: Evelina Bucy, DPM;  Location: [place];  Service: Podiatry;;  . APPLICATION OF WOUND VAC Left 10/28/2018   Procedure: APPLICATION OF WOUND VAC LEFT TOE;  Surgeon: Evelina Bucy, DPM;  Location: [place];  Service: Podiatry;  Laterality: Left;  . APPLICATION OF WOUND VAC Left 11/01/2018   Procedure: APPLICATION OF WOUND VAC;  Surgeon: Evelina Bucy, DPM;  Location: [place];  Service: Podiatry;  Laterality: Left;  . AV FISTULA PLACEMENT     left arm.  . AV FISTULA PLACEMENT Right 12/22/2016   Procedure: INSERTION OF ARTERIOVENOUS (AV) GORE-TEX GRAFT ARM;  Surgeon: Elam Dutch, MD;  Location: [facility] OR;  Service: Vascular;  Laterality: Right;  . AV FISTULA PLACEMENT Left 05/26/2018   Procedure: INSERTION OF  ARTERIOVENOUS (AV) GORE-TEX GRAFT LEFT ARM;  Surgeon: Serafina Mitchell, MD;  Location: [place];  Service: Vascular;  Laterality: Left;  . EYE SURGERY    . I & D EXTREMITY Right 10/31/2017   Procedure: IRRIGATION AND DEBRIDEMENT RIGHT FOOT;  Surgeon: Evelina Bucy, DPM;  Location: [place];  Service: Podiatry;  Laterality: Right;  . I & D EXTREMITY Left 08/20/2018   Procedure: IRRIGATION AND DEBRIDEMENT EXTREMITY WITH SECONDARY WOUND CLOSUREAND APPLICATION OF WOUND VAC LEFT FOOT;  Surgeon:  Evelina Bucy, DPM;  Location: [place];  Service: Podiatry;  Laterality: Left;  . I & D EXTREMITY Left 10/20/2018   Procedure: IRRIGATION AND DEBRIDEMENT LEFT FOOT  DEBRIDEMENT LATERAL FOOT WOUND;  Surgeon: Evelina Bucy, DPM;  Location: [place];  Service: Podiatry;  Laterality: Left;  . I & D EXTREMITY Left 10/28/2018   Procedure: IRRIGATION AND DEBRIDEMENT LEFT TOE;  Surgeon: Evelina Bucy, DPM;  Location: [place];  Service: Podiatry;  Laterality: Left;  . INSERTION OF DIALYSIS CATHETER     Right subclavian  . IR AV DIALY SHUNT INTRO NEEDLE/INTRACATH INITIAL W/PTA/IMG RIGHT Right 02/05/2018  . IR FLUORO GUIDE CV LINE RIGHT  01/31/2020  . IR THROMBECTOMY AV FISTULA  W/THROMBOLYSIS/PTA INC/SHUNT/IMG LEFT Left 08/24/2018  . IR THROMBECTOMY AV FISTULA W/THROMBOLYSIS/PTA INC/SHUNT/IMG LEFT Left 01/06/2019  . IR US GUIDE VASC ACCESS LEFT  08/24/2018  . IR US GUIDE VASC ACCESS RIGHT  02/05/2018  . IR US GUIDE VASC ACCESS RIGHT  01/31/2020  . IRRIGATION AND DEBRIDEMENT FOOT Right 10/23/2018   Procedure: Irrigation and Debridement to tendon, Left Foot;  Surgeon: Evelina Bucy, DPM;  Location: [place];  Service: Podiatry;  Laterality: Right;  . IRRIGATION AND DEBRIDEMENT FOOT Left 11/01/2018   Procedure: IRRIGATION AND DEBRIDEMENT PARTIAL WOUND CLOSURE LOCAL TISSUE TRANSFER AND FLAP ROTATION, LEFT FOOT;  Surgeon: Evelina Bucy, DPM;  Location: [place];  Service: Podiatry;  Laterality: Left;  . TRANSMETATARSAL AMPUTATION N/A 08/18/2018   Procedure: IRRIGATION AND DEBRIDEMENT OF LEFT 5TH TOE AND TRANSMETATARSAL, WITH PARTICAL LEFT 5TH TOE AND METATARSAL AMPUTATION, BONE BIOPSY, WOUND VAC APPLICATION.;  Surgeon: Evelina Bucy, DPM;  Location: [place];  Service: Podiatry;  Laterality: N/A;  . UPPER EXTREMITY VENOGRAPHY N/A 11/16/2016   Procedure: Upper Extremity Venography - Right Central;  Surgeon: Elam Dutch, MD;  Location: [place] CV LAB;  Service: Cardiovascular;  Laterality: N/A;  . UPPER EXTREMITY VENOGRAPHY N/A 05/25/2018   Procedure: UPPER EXTREMITY VENOGRAPHY - Bilateral;  Surgeon: Marty Heck, MD;  Location: [place] [facility] LAB;  Service: Cardiovascular;  Laterality: N/A;   Family History  Family History  Problem Relation Age of Onset  . Diabetes Mellitus II Other   . Diabetes Father   . Renal Disease Father        ESRD   Social History  reports that he has never smoked. He has never used smokeless tobacco. He reports that he does not drink alcohol or use drugs. Allergies No Known Allergies Home medications Prior to Admission medications   Medication Sig Start Date End Date Taking? Authorizing Provider  calcitRIOL (ROCALTROL) 0.5 MCG  capsule Take 5 capsules (2.5 mcg total) by mouth every Monday, Wednesday, and Friday with hemodialysis. 10/26/18  Yes Hongalgi, Lenis Dickinson, MD  ciprofloxacin (CIPRO) 500 MG tablet Take 1 tablet (500 mg total) by mouth daily with breakfast for 14 days. 02/01/20 02/15/20 Yes Danford, Suann Lorance, MD  NOVOLOG 100 UNIT/ML injection INJECT 6 TO 7 UNITS SUBCUTANEOUSLY TWICE DAILY WITH A MEAL Patient taking differently: Inject 7 Units into the skin 3 (three) times daily with meals.  11/10/19  Yes Kerin Perna, NP  sevelamer carbonate (RENVELA) 800 MG tablet Take 2,400-4,000 mg by mouth See admin instructions. Take 5 tablets with meals then take 3 tablets with snacks 04/27/19  Yes [provider]  insulin glargine (LANTUS SOLOSTAR) 100 UNIT/ML Solostar Pen Inject 12 Units into the skin daily. 02/13/20   Kerin Perna, NP  Vitals:   02/14/20 0915 02/14/20 0945 02/14/20 1030 02/14/20 1148  BP:    (!) 149/98  Pulse: 71 71 76 74  Resp: 14 14 15 16   Temp:    97.8 F (36.6 C)  TempSrc:    Oral  SpO2: 100% 99% 100% 100%  Weight:      Height:       Exam Gen small framed male no distress No rash, cyanosis or gangrene Sclera anicteric, throat clear  No jvd or bruits Chest occ basilar rales, mostly clear, no bronch BS RRR no MRG Abd soft ntnd no mass or ascites +bs GU normal male  MS no joint effusions or deformity Ext no LE or UE edema, L foot w/ lateral ulcer Neuro is alert, Ox 3 , nf  LUA AVG+ bruit    Home meds:  - rocaltrol 0.5 mwf/ cipro 500 qd 2wks  - novolog 6-7 ac tid/ lantus 12u qd  - renvela 5 qc tid  - prn's/ vitamins/ supplements     OP HD: MWF [facility]   4h  400/800  70kg  2/2 bath  AVG  Hep 5000+ 5000 midrun    - calcitriol 2.25 ug tiw   - no esa   Assessment/ Plan: 1. COVID-19 PNA - started on remdesivir and Rocephin. Per primary team.  2. ESRD - on HD MWF. Last HD Monday. Labs and exam are stable. Will plan HD off schedule tomorrow, then Friday.   3. Nausea/ vomiting - hx of diabetic gastroparesis, and/or COVID-19 related. Started on IV reglan and antiemetics.  4. Diabetic foot infection - f/b podiatry. MRI on 5/5 did not show any osteomyelitis , per H&P.  5. Type 1 DM - per primary 6. 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 MBC ckd - cont calcitriol      Kelly Splinter  MD 02/14/2020, 2:43 PM  Recent Labs  Lab 02/13/20 1832  WBC 4.6  HGB 12.8*   Recent Labs  Lab 02/13/20 1832  K 4.7  BUN 37*  CREATININE 12.70*  CALCIUM 7.7*

## 2020-02-14 NOTE — H&P (Signed)
History and Physical    [name]:[id number] DOB: November 09, 1980 DOA: 02/13/2020  PCP: Kerin Perna, NP  Patient coming from: Home  I have personally briefly reviewed patient's old medical records in [place]  Chief Complaint: Vomiting  HPI: [name] is a 39 y.o. male with medical history significant of end-stage renal disease on hemodialysis, diabetes, diabetic gastroparesis, chronic left foot wound being followed by podiatry, presents to the hospital with fever, persistent vomiting and reported diarrhea.  Patient is a poor historian and cannot give me a clear timeline of his symptoms.  He reports that he has chronic vomiting from gastroparesis, but felt that his been worse here lately.  He also describes loose stools, but also says that he has 1-2 bowel movements per day.  He has diffuse abdominal pain.  His symptoms get worse after he tries to eat or drink anything.  He was unaware of having any fevers at home.  He has a chronic cough which appears to be unchanged.  He is noticing more shortness of breath on exertion, but feels comfortable at rest.  He has not had any trouble with vaccinations.  He is not had any known exposure to anyone who has recently tested positive for Covid.  ED Course: Noted to be mildly febrile on arrival with a temperature of 100.5.  Hemodynamically stable.  Plain films of his left foot did not show evidence of osteomyelitis.  Chest x-ray does show bibasilar infiltrates.  COVID-19 test is positive.  Mild thrombocytopenia with a platelet count of 111.  Remainder labs are unrevealing.  Lactic acid normal.  Review of Systems: As per HPI otherwise 10 point review of systems negative.    Past Medical History:  Diagnosis Date  . Diabetes mellitus without complication ([place])   . Diabetic gastroparesis ([place])   . Dialysis patient ([place])   . Hypertension   . Renal disorder    Dialysis  . Sepsis [facility])     Past Surgical History:  Procedure  Laterality Date  . AMPUTATION Right 02/02/2018   Procedure: RIGHT FIFTH TOE AND METATARSAL AMPUTATION. Filetted toe flap metatarsal resection. Debridement Plantar Foot wound;  Surgeon: Evelina Bucy, DPM;  Location: [place];  Service: Podiatry;  Laterality: Right;  . AMPUTATION Left 08/20/2018   Procedure: FIFTH METATARSAL BONE BIOPSY;  Surgeon: Evelina Bucy, DPM;  Location: [place];  Service: Podiatry;  Laterality: Left;  . AMPUTATION Left 10/28/2018   Procedure: LEFT GREAT TOE AMPUTATION;  Surgeon: Evelina Bucy, DPM;  Location: [place];  Service: Podiatry;  Laterality: Left;  . APPLICATION OF WOUND VAC  02/02/2018   Procedure: APPLICATION OF WOUND VAC  Right Foot;  Surgeon: Evelina Bucy, DPM;  Location: [place];  Service: Podiatry;;  . APPLICATION OF WOUND VAC Left 10/28/2018   Procedure: APPLICATION OF WOUND VAC LEFT TOE;  Surgeon: Evelina Bucy, DPM;  Location: [place];  Service: Podiatry;  Laterality: Left;  . APPLICATION OF WOUND VAC Left 11/01/2018   Procedure: APPLICATION OF WOUND VAC;  Surgeon: Evelina Bucy, DPM;  Location: [place];  Service: Podiatry;  Laterality: Left;  . AV FISTULA PLACEMENT     left arm.  . AV FISTULA PLACEMENT Right 12/22/2016   Procedure: INSERTION OF ARTERIOVENOUS (AV) GORE-TEX GRAFT ARM;  Surgeon: Elam Dutch, MD;  Location: [facility] OR;  Service: Vascular;  Laterality: Right;  . AV FISTULA PLACEMENT Left 05/26/2018   Procedure: INSERTION OF  ARTERIOVENOUS (AV) GORE-TEX GRAFT  LEFT ARM;  Surgeon: Serafina Mitchell, MD;  Location[facility];  Service: Vascular;  Laterality: Left;  . EYE SURGERY    . I & D EXTREMITY Right 10/31/2017   Procedure: IRRIGATION AND DEBRIDEMENT RIGHT FOOT;  Surgeon: Evelina Bucy, DPM;  Location: [place];  Service: Podiatry;  Laterality: Right;  . I & D EXTREMITY Left 08/20/2018   Procedure: IRRIGATION AND DEBRIDEMENT EXTREMITY WITH SECONDARY WOUND CLOSUREAND APPLICATION OF WOUND VAC LEFT FOOT;  Surgeon: Evelina Bucy, DPM;  Location: [place];  Service: Podiatry;  Laterality: Left;  . I & D EXTREMITY Left 10/20/2018   Procedure: IRRIGATION AND DEBRIDEMENT LEFT FOOT  DEBRIDEMENT LATERAL FOOT WOUND;  Surgeon: Evelina Bucy, DPM;  Location: [place];  Service: Podiatry;  Laterality: Left;  . I & D EXTREMITY Left 10/28/2018   Procedure: IRRIGATION AND DEBRIDEMENT LEFT TOE;  Surgeon: Evelina Bucy, DPM;  Location: [place];  Service: Podiatry;  Laterality: Left;  . INSERTION OF DIALYSIS CATHETER     Right subclavian  . IR AV DIALY SHUNT INTRO NEEDLE/INTRACATH INITIAL W/PTA/IMG RIGHT Right 02/05/2018  . IR FLUORO GUIDE CV LINE RIGHT  01/31/2020  . IR THROMBECTOMY AV FISTULA W/THROMBOLYSIS/PTA INC/SHUNT/IMG LEFT Left 08/24/2018  . IR THROMBECTOMY AV FISTULA W/THROMBOLYSIS/PTA INC/SHUNT/IMG LEFT Left 01/06/2019  . IR US GUIDE VASC ACCESS LEFT  08/24/2018  . IR US GUIDE VASC ACCESS RIGHT  02/05/2018  . IR US GUIDE VASC ACCESS RIGHT  01/31/2020  . IRRIGATION AND DEBRIDEMENT FOOT Right 10/23/2018   Procedure: Irrigation and Debridement to tendon, Left Foot;  Surgeon: Evelina Bucy, DPM;  Location: [place];  Service: Podiatry;  Laterality: Right;  . IRRIGATION AND DEBRIDEMENT FOOT Left 11/01/2018   Procedure: IRRIGATION AND DEBRIDEMENT PARTIAL WOUND CLOSURE LOCAL TISSUE TRANSFER AND FLAP ROTATION, LEFT FOOT;  Surgeon: Evelina Bucy, DPM;  Location: [place];  Service: Podiatry;  Laterality: Left;  . TRANSMETATARSAL AMPUTATION N/A 08/18/2018   Procedure: IRRIGATION AND DEBRIDEMENT OF LEFT 5TH TOE AND TRANSMETATARSAL, WITH PARTICAL LEFT 5TH TOE AND METATARSAL AMPUTATION, BONE BIOPSY, WOUND VAC APPLICATION.;  Surgeon: Evelina Bucy, DPM;  Location: [place];  Service: Podiatry;  Laterality: N/A;  . UPPER EXTREMITY VENOGRAPHY N/A 11/16/2016   Procedure: Upper Extremity Venography - Right Central;  Surgeon: Elam Dutch, MD;  Location: [place] [facility];  Service: Cardiovascular;  Laterality: N/A;  . UPPER EXTREMITY VENOGRAPHY N/A 05/25/2018    Procedure: UPPER EXTREMITY VENOGRAPHY - Bilateral;  Surgeon: Marty Heck, MD;  Location: [place] [facility];  Service: Cardiovascular;  Laterality: N/A;    Social History:  reports that he has never smoked. He has never used smokeless tobacco. He reports that he does not drink alcohol or use drugs.  No Known Allergies  Family History  Problem Relation Age of Onset  . Diabetes Mellitus II Other   . Diabetes Father   . Renal Disease Father        ESRD    Prior to Admission medications   Medication Sig Start Date End Date Taking? Authorizing Provider  calcitRIOL (ROCALTROL) 0.5 MCG capsule Take 5 capsules (2.5 mcg total) by mouth every Monday, Wednesday, and Friday with hemodialysis. 10/26/18  Yes Hongalgi, Lenis Dickinson, MD  ciprofloxacin (CIPRO) 500 MG tablet Take 1 tablet (500 mg total) by mouth daily with breakfast for 14 days. 02/01/20 02/15/20 Yes Danford, Suann Mazin, MD  NOVOLOG 100 UNIT/ML injection INJECT 6 TO 7 UNITS SUBCUTANEOUSLY TWICE DAILY WITH A MEAL  Patient taking differently: Inject 7 Units into the skin 3 (three) times daily with meals.  11/10/19  Yes Kerin Perna, NP  sevelamer carbonate (RENVELA) 800 MG tablet Take 2,400-4,000 mg by mouth See admin instructions. Take 5 tablets with meals then take 3 tablets with snacks 04/27/19  Yes [provider]  insulin glargine (LANTUS SOLOSTAR) 100 UNIT/ML Solostar Pen Inject 12 Units into the skin daily. 02/13/20   Kerin Perna, NP    Physical Exam: Vitals:   02/14/20 0915 02/14/20 0945 02/14/20 1030 02/14/20 1148  BP:    (!) 149/98  Pulse: 71 71 76 74  Resp: 14 14 15 16   Temp:    97.8 F (36.6 C)  TempSrc:    Oral  SpO2: 100% 99% 100% 100%  Weight:      Height:        Constitutional: NAD, calm, comfortable Eyes: PERRL, lids and conjunctivae normal ENMT: Mucous membranes are moist. Posterior pharynx clear of any exudate or lesions.Normal dentition.  Neck: normal, supple, no masses, no  thyromegaly Respiratory: clear to auscultation bilaterally, no wheezing, no crackles. Normal respiratory effort. No accessory muscle use.  Cardiovascular: Regular rate and rhythm, no murmurs / rubs / gallops. No extremity edema. 2+ pedal pulses. No carotid bruits.  Abdomen: Soft, mild diffuse tenderness, no masses palpated. No hepatosplenomegaly. Bowel sounds positive.  Musculoskeletal: no clubbing / cyanosis. No joint deformity upper and lower extremities. Good ROM, no contractures. Normal muscle tone.  Skin: wound on left foot per pictures below Neurologic: CN 2-12 grossly intact. Sensation intact, DTR normal. Strength 5/5 in all 4.  Psychiatric: Normal judgment and insight. Alert and oriented x 3. Normal mood.         Labs on Admission: I have personally reviewed following labs and imaging studies  CBC: Recent Labs  Lab 02/13/20 1832  WBC 4.6  HGB 12.8*  HCT 40.5  MCV 96.2  PLT 841*   Basic Metabolic Panel: Recent Labs  Lab 02/13/20 1832  NA 129*  K 4.7  CL 90*  CO2 26  GLUCOSE 98  BUN 37*  CREATININE 12.70*  CALCIUM 7.7*   GFR: Estimated Creatinine Clearance: 6.9 mL/min (A) (by C-G formula based on SCr of 12.7 mg/dL (H)). Liver Function Tests: Recent Labs  Lab 02/13/20 1832  AST 27  ALT 15  ALKPHOS 87  BILITOT 1.0  PROT 6.4*  ALBUMIN 2.8*   Recent Labs  Lab 02/13/20 1832  LIPASE 20   No results for input(s): AMMONIA in the last 168 hours. Coagulation Profile: No results for input(s): INR, PROTIME in the last 168 hours. Cardiac Enzymes: No results for input(s): CKTOTAL, CKMB, CKMBINDEX, TROPONINI in the last 168 hours. BNP (last 3 results) No results for input(s): PROBNP in the last 8760 hours. HbA1C: No results for input(s): HGBA1C in the last 72 hours. CBG: No results for input(s): GLUCAP in the last 168 hours. Lipid Profile: No results for input(s): CHOL, HDL, LDLCALC, TRIG, CHOLHDL, LDLDIRECT in the last 72 hours. Thyroid Function  Tests: No results for input(s): TSH, T4TOTAL, FREET4, T3FREE, THYROIDAB in the last 72 hours. Anemia Panel: No results for input(s): VITAMINB12, FOLATE, FERRITIN, TIBC, IRON, RETICCTPCT in the last 72 hours. Urine analysis:    Component Value Date/Time   COLORURINE YELLOW 05/26/2015 1115   APPEARANCEUR CLEAR 05/26/2015 1115   APPEARANCEUR Hazy 07/30/2014 2303   LABSPEC 1.016 05/26/2015 1115   LABSPEC 1.012 07/30/2014 2303   PHURINE 8.0 05/26/2015 1115   GLUCOSEU 500 (  A) 05/26/2015 1115   GLUCOSEU >=500 07/30/2014 2303   HGBUR MODERATE (A) 05/26/2015 1115   BILIRUBINUR NEGATIVE 05/26/2015 1115   BILIRUBINUR Negative 07/30/2014 2303   KETONESUR 15 (A) 05/26/2015 1115   PROTEINUR >300 (A) 05/26/2015 1115   UROBILINOGEN 0.2 05/26/2015 1115   NITRITE NEGATIVE 05/26/2015 1115   LEUKOCYTESUR NEGATIVE 05/26/2015 1115   LEUKOCYTESUR Negative 07/30/2014 2303    Radiological Exams on Admission: DG Chest Port 1 View  Result Date: 02/14/2020 CLINICAL DATA:  Vomiting. Fever. EXAM: PORTABLE CHEST 1 VIEW COMPARISON:  04/24/2019 FINDINGS: The heart is within normal limits given the AP projection and portable technique. Mild tortuosity of the thoracic aorta. Vascular stents are noted. Patchy bibasilar lung opacities suspicious for infiltrates. No pleural effusions or pulmonary edema. The bony thorax is grossly intact. IMPRESSION: Bibasilar infiltrates. Electronically Signed   By: Marijo Sanes M.D.   On: 02/14/2020 05:10   DG Foot 2 Views Left  Result Date: 02/14/2020 CLINICAL DATA:  Foot pain and swelling. EXAM: LEFT FOOT - 2 VIEW COMPARISON:  01/27/2020 radiographs and 01/31/2020 MRI FINDINGS: Prior amputations of the mid first metatarsal and most of the fifth metatarsal. No change since prior study. No obvious destructive bony changes to suggest osteomyelitis. 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 IMPRESSION: 1.  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 2. No definite plain film findings for osteomyelitis. 3. 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 Electronically Signed   By: Marijo Sanes M.D.   On: 02/14/2020 05:08    EKG: Independently reviewed.  Sinus rhythm without acute changes  Assessment/Plan Active Problems:   Insulin dependent type 1 diabetes mellitus ([place])   Diabetic gastroparesis ([name])   ESRD on dialysis ([place])   HTN (hypertension)   Diabetic foot infection ([place])   Pneumonia due to COVID-19 virus     1. Pneumonia COVID-19 virus.  Chest x-ray indicates bibasilar infiltrates.  He does not appear to be hypoxic at this time.  Patient has been started on remdesivir.  He received a dose of intravenous Decadron in the emergency room.  Will check CRP, ferritin and other inflammatory markers.  If these are elevated, can consider continuing Decadron.  Currently he is breathing comfortably on room air. 2. Persistent vomiting.  Patient reports a history of diabetic gastroparesis.  His vomiting could be worsened by COVID-19.  Start the patient on Reglan and antiemetics.  Advance diet as tolerated. 3. Diabetic foot infection.  Being followed closely by podiatry.  He had an MRI of his left foot 2 weeks ago on 5/5 which did not show any evidence of osteomyelitis.  Will consult podiatry and defer need to repeat MRI to podiatry.  Started on ceftriaxone. 4. End-stage renal disease on hemodialysis.  Monday Wednesday Friday.  Informed nephrology service of patient's admission. 5. Insulin-dependent type 1 diabetes.  Continue on Lantus and NovoLog insulin.  DVT prophylaxis: heparin  Code Status: full code  Family Communication: discussed with patient  Disposition Plan: discharge home once wound has been addressed and covid status is stable  Consults called: Nephrology, Podiatry Admission status: inpatient, telemetry  Severity of Illness: The appropriate patient status for this  patient is INPATIENT. Inpatient status is judged to be reasonable and necessary in order to provide the required intensity of service to ensure the patient's safety. The patient's presenting symptoms, physical exam findings, and initial radiographic and laboratory data in the context of their chronic comorbidities is  felt to place them at high risk for further clinical deterioration. Furthermore, it is not anticipated that the patient will be medically stable for discharge from the hospital within 2 midnights of admission. The following factors support the patient status of inpatient.    "           The patient's presenting symptoms include  persistent nausea and vomiting, generalized weakness, pain left foot "           The worrisome physical exam findings include  generalized abdominal tenderness, significant wound on left foot "           The initial radiographic and laboratory data are worrisome because of  positive COVID-19 test, chest x-ray with bibasilar infiltrates "           The chronic co-morbidities include  insulin-dependent diabetes, end-stage renal disease on hemodialysis, chronic lower extremity wounds     * I certify that at the point of admission it is my clinical judgment that the patient will require inpatient hospital care spanning beyond 2 midnights from the point of admission due to high intensity of service, high risk for further deterioration and high frequency of surveillance required.Kathie Dike MD [facility]   If 7PM-7AM, please contact night-coverage [email or web address]   02/14/2020, 11:59 AM

## 2020-02-14 NOTE — ED Provider Notes (Signed)
[name] Provider Note   CSN: [id number] Arrival date & time: 02/13/20  1800     History Chief Complaint  Patient presents with  . Emesis    [name] is a 39 y.o. male.  The history is provided by the patient.  Emesis Severity:  Severe Duration:  8 days Timing:  Intermittent Progression:  Worsening Chronicity:  New Relieved by:  Nothing Worsened by:  Nothing Associated symptoms: no fever    Patient with history of diabetes, renal failure, hypertension presents with vomiting.  Patient reports he has had vomiting for 8 days.  He reports he is unable to eat anything.  He reports abdominal discomfort from vomiting. He also reports a wound to his left foot.    Past Medical History:  Diagnosis Date  . Diabetes mellitus without complication ([place])   . Diabetic gastroparesis ([place])   . Dialysis patient ([place])   . Hypertension   . Renal disorder    Dialysis  . Sepsis [facility])     Patient Active Problem List   Diagnosis Date Noted  . Cellulitis of left foot 01/31/2020  . Allergy, unspecified, initial encounter 07/17/2019  . Diarrhea 04/24/2019  . Bone erosion determined by x-ray   . Encephalopathy acute   . Diabetic ulcer of left midfoot associated with diabetes mellitus due to underlying condition, with necrosis of bone ([place])   . Open wound of left foot   . Encounter for orthopedic aftercare following surgical amputation   . Foot infection   . Gangrene of toe of left foot ([place])   . Cellulitis 10/26/2018  . Diabetic ulcer of left midfoot associated with diabetes mellitus due to underlying condition, with bone involvement without evidence of necrosis ([place])   . Nausea vomiting and diarrhea 10/20/2018  . Septic arthritis of left foot ([place])   . DM (diabetes mellitus), secondary, uncontrolled, with neurologic complications ([place])   . Headache 09/23/2018  . Osteomyelitis of foot, left, acute ([name])   . Acute osteomyelitis involving  ankle and foot, left ([place])   . Osteomyelitis of fifth toe of left foot ([place])   . Anemia of chronic disease 08/18/2018  . Encounter for immunization 06/29/2018  . Hyperkalemia   . Unspecified protein-calorie malnutrition ([place]) 02/14/2018  . Encounter for removal of sutures 02/07/2018  . Osteomyelitis ([place]) 02/01/2018  . Fluid overload, unspecified 01/22/2018  . Osteomyelitis of ankle or foot, acute, right ([place]) 10/30/2017  . Cellulitis and abscess of toe of left foot   . Diabetic foot infection ([place])   . Insulin dependent diabetes mellitus 10/25/2017  . Diabetic gastroparesis ([place]) 10/25/2017  . Sepsis ([place]) 10/25/2017  . Fever, unspecified 10/28/2016  . Iron deficiency anemia, unspecified 10/28/2016  . 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 Pruritus, unspecified 10/28/2016  . Secondary hyperparathyroidism of renal origin ([place]) 10/28/2016  . Shortness of breath 10/28/2016  . HTN (hypertension) 08/09/2014  . ESRD on dialysis ([place]) 03/19/2014  . MSSA bacteremia 05/31/2013  . Metabolic acidosis 05/39/7673    Past Surgical History:  Procedure Laterality Date  . AMPUTATION Right 02/02/2018   Procedure: RIGHT FIFTH TOE AND METATARSAL AMPUTATION. Filetted toe flap metatarsal resection. Debridement Plantar Foot wound;  Surgeon: Evelina Bucy, DPM;  Location: [place];  Service: Podiatry;  Laterality: Right;  . AMPUTATION Left 08/20/2018   Procedure: FIFTH METATARSAL BONE BIOPSY;  Surgeon: Evelina Bucy, DPM;  Location: [place];  Service: Podiatry;  Laterality: Left;  . AMPUTATION Left 10/28/2018  Procedure: LEFT GREAT TOE AMPUTATION;  Surgeon: Evelina Bucy, DPM;  Location: [place];  Service: Podiatry;  Laterality: Left;  . APPLICATION OF WOUND VAC  02/02/2018   Procedure: APPLICATION OF WOUND VAC  Right Foot;  Surgeon: Evelina Bucy, DPM;  Location: [place];  Service: Podiatry;;  . APPLICATION OF WOUND VAC Left 10/28/2018   Procedure: APPLICATION OF WOUND VAC LEFT TOE;   Surgeon: Evelina Bucy, DPM;  Location: [place];  Service: Podiatry;  Laterality: Left;  . APPLICATION OF WOUND VAC Left 11/01/2018   Procedure: APPLICATION OF WOUND VAC;  Surgeon: Evelina Bucy, DPM;  Location: [place];  Service: Podiatry;  Laterality: Left;  . AV FISTULA PLACEMENT     left arm.  . AV FISTULA PLACEMENT Right 12/22/2016   Procedure: INSERTION OF ARTERIOVENOUS (AV) GORE-TEX GRAFT ARM;  Surgeon: Elam Dutch, MD;  Location: [facility] OR;  Service: Vascular;  Laterality: Right;  . AV FISTULA PLACEMENT Left 05/26/2018   Procedure: INSERTION OF  ARTERIOVENOUS (AV) GORE-TEX GRAFT LEFT ARM;  Surgeon: Serafina Mitchell, MD;  Location: [place];  Service: Vascular;  Laterality: Left;  . EYE SURGERY    . I & D EXTREMITY Right 10/31/2017   Procedure: IRRIGATION AND DEBRIDEMENT RIGHT FOOT;  Surgeon: Evelina Bucy, DPM;  Location: [place];  Service: Podiatry;  Laterality: Right;  . I & D EXTREMITY Left 08/20/2018   Procedure: IRRIGATION AND DEBRIDEMENT EXTREMITY WITH SECONDARY WOUND CLOSUREAND APPLICATION OF WOUND VAC LEFT FOOT;  Surgeon: Evelina Bucy, DPM;  Location: [place];  Service: Podiatry;  Laterality: Left;  . I & D EXTREMITY Left 10/20/2018   Procedure: IRRIGATION AND DEBRIDEMENT LEFT FOOT  DEBRIDEMENT LATERAL FOOT WOUND;  Surgeon: Evelina Bucy, DPM;  Location: [place];  Service: Podiatry;  Laterality: Left;  . I & D EXTREMITY Left 10/28/2018   Procedure: IRRIGATION AND DEBRIDEMENT LEFT TOE;  Surgeon: Evelina Bucy, DPM;  Location: [place];  Service: Podiatry;  Laterality: Left;  . INSERTION OF DIALYSIS CATHETER     Right subclavian  . IR AV DIALY SHUNT INTRO NEEDLE/INTRACATH INITIAL W/PTA/IMG RIGHT Right 02/05/2018  . IR FLUORO GUIDE CV LINE RIGHT  01/31/2020  . IR THROMBECTOMY AV FISTULA W/THROMBOLYSIS/PTA INC/SHUNT/IMG LEFT Left 08/24/2018  . IR THROMBECTOMY AV FISTULA W/THROMBOLYSIS/PTA INC/SHUNT/IMG LEFT Left 01/06/2019  . IR US GUIDE VASC ACCESS LEFT  08/24/2018  . IR US GUIDE VASC  ACCESS RIGHT  02/05/2018  . IR US GUIDE VASC ACCESS RIGHT  01/31/2020  . IRRIGATION AND DEBRIDEMENT FOOT Right 10/23/2018   Procedure: Irrigation and Debridement to tendon, Left Foot;  Surgeon: Evelina Bucy, DPM;  Location: [place];  Service: Podiatry;  Laterality: Right;  . IRRIGATION AND DEBRIDEMENT FOOT Left 11/01/2018   Procedure: IRRIGATION AND DEBRIDEMENT PARTIAL WOUND CLOSURE LOCAL TISSUE TRANSFER AND FLAP ROTATION, LEFT FOOT;  Surgeon: Evelina Bucy, DPM;  Location: [place];  Service: Podiatry;  Laterality: Left;  . TRANSMETATARSAL AMPUTATION N/A 08/18/2018   Procedure: IRRIGATION AND DEBRIDEMENT OF LEFT 5TH TOE AND TRANSMETATARSAL, WITH PARTICAL LEFT 5TH TOE AND METATARSAL AMPUTATION, BONE BIOPSY, WOUND VAC APPLICATION.;  Surgeon: Evelina Bucy, DPM;  Location: [place];  Service: Podiatry;  Laterality: N/A;  . UPPER EXTREMITY VENOGRAPHY N/A 11/16/2016   Procedure: Upper Extremity Venography - Right Central;  Surgeon: Elam Dutch, MD;  Location: [place] CV LAB;  Service: Cardiovascular;  Laterality: N/A;  . UPPER EXTREMITY VENOGRAPHY N/A 05/25/2018   Procedure: UPPER EXTREMITY  VENOGRAPHY - Bilateral;  Surgeon: Marty Heck, MD;  Location: [place] CV LAB;  Service: Cardiovascular;  Laterality: N/A;       Family History  Problem Relation Age of Onset  . Diabetes Mellitus II Other   . Diabetes Father   . Renal Disease Father        ESRD    Social History   Tobacco Use  . Smoking status: Never Smoker  . Smokeless tobacco: Never Used  Substance Use Topics  . Alcohol use: No  . Drug use: No    Home Medications Prior to Admission medications   Medication Sig Start Date End Date Taking? Authorizing Provider  calcitRIOL (ROCALTROL) 0.5 MCG capsule Take 5 capsules (2.5 mcg total) by mouth every Monday, Wednesday, and Friday with hemodialysis. 10/26/18  Yes Hongalgi, Lenis Dickinson, MD  ciprofloxacin (CIPRO) 500 MG tablet Take 1 tablet (500 mg total) by mouth daily with  breakfast for 14 days. 02/01/20 02/15/20 Yes Danford, Suann Hillard, MD  NOVOLOG 100 UNIT/ML injection INJECT 6 TO 7 UNITS SUBCUTANEOUSLY TWICE DAILY WITH A MEAL Patient taking differently: Inject 7 Units into the skin 3 (three) times daily with meals.  11/10/19  Yes Kerin Perna, NP  sevelamer carbonate (RENVELA) 800 MG tablet Take 2,400-4,000 mg by mouth See admin instructions. Take 5 tablets with meals then take 3 tablets with snacks 04/27/19  Yes [provider]  insulin glargine (LANTUS SOLOSTAR) 100 UNIT/ML Solostar Pen Inject 12 Units into the skin daily. 02/13/20   Kerin Perna, NP    Allergies    Patient has no known allergies.  Review of Systems   Review of Systems  Constitutional: Negative for fever.  Gastrointestinal: Positive for vomiting.  Skin: Positive for wound.  All other systems reviewed and are negative.   Physical Exam Updated Vital Signs BP 125/84 (BP Location: Right Arm)   Pulse 85   Temp 100 F (37.8 C) (Oral)   Resp 19   Ht 1.676 m (5\' 6" )   Wt 63.5 kg   SpO2 98%   BMI 22.60 kg/m   Physical Exam CONSTITUTIONAL: Chronically ill-appearing, no acute distress HEAD: Normocephalic/atraumatic EYES: EOMI/PERRL ENMT: Mucous membranes dry NECK: supple no meningeal signs SPINE/BACK:entire spine nontender CV: S1/S2 noted LUNGS: Lungs are clear to auscultation bilaterally, no apparent distress ABDOMEN: soft, nontender NEURO: Pt is awake/alert/appropriate, moves all extremitiesx4.  No facial droop.  EXTREMITIES: pulses normal/equal, full ROM,, wound noted to left foot.  There is no crepitus. see photo below SKIN: warm, color normal PSYCH: no abnormalities of mood noted, alert and oriented to situation      ED Results / Procedures / Treatments   Labs (all labs ordered are listed, but only abnormal results are displayed) Labs Reviewed  SARS CORONAVIRUS 2 BY RT PCR (HOSPITAL ORDER, [place] [facility]) - Abnormal;  Notable for the following components:      Result Value   SARS Coronavirus 2 POSITIVE (*)    All other components within normal limits  COMPREHENSIVE METABOLIC PANEL - Abnormal; Notable for the following components:   Sodium 129 (*)    Chloride 90 (*)    BUN 37 (*)    Creatinine, Ser 12.70 (*)    Calcium 7.7 (*)    Total Protein 6.4 (*)    Albumin 2.8 (*)    GFR calc non Af Amer 4 (*)    GFR calc Af Amer 5 (*)    All other components within normal  limits  CBC - Abnormal; Notable for the following components:   RBC 4.21 (*)    Hemoglobin 12.8 (*)    Platelets 111 (*)    nRBC 0.4 (*)    All other components within normal limits  CULTURE, BLOOD (ROUTINE X 2)  CULTURE, BLOOD (ROUTINE X 2)  LIPASE, BLOOD  LACTIC ACID, PLASMA  LACTIC ACID, PLASMA    EKG EKG Interpretation  Date/Time:  Wednesday Feb 14 2020 02:17:22 EDT Ventricular Rate:  83 PR Interval:    QRS Duration: 80 QT Interval:  377 QTC Calculation: 443 R Axis:   107 Text Interpretation: Sinus rhythm Lateral infarct, age indeterminate Probable anteroseptal infarct, old Confirmed by Ripley Fraise ([id number]) on 02/14/2020 2:27:43 AM   Radiology DG Chest Port 1 View  Result Date: 02/14/2020 CLINICAL DATA:  Vomiting. Fever. EXAM: PORTABLE CHEST 1 VIEW COMPARISON:  04/24/2019 FINDINGS: The heart is within normal limits given the AP projection and portable technique. Mild tortuosity of the thoracic aorta. Vascular stents are noted. Patchy bibasilar lung opacities suspicious for infiltrates. No pleural effusions or pulmonary edema. The bony thorax is grossly intact. IMPRESSION: Bibasilar infiltrates. Electronically Signed   By: Marijo Sanes M.D.   On: 02/14/2020 05:10   DG Foot 2 Views Left  Result Date: 02/14/2020 CLINICAL DATA:  Foot pain and swelling. EXAM: LEFT FOOT - 2 VIEW COMPARISON:  01/27/2020 radiographs and 01/31/2020 MRI FINDINGS: Prior amputations of the mid first metatarsal and most of the fifth metatarsal. No  change since prior study. No obvious destructive bony changes to suggest osteomyelitis. 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 IMPRESSION: 1. 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 2. No definite plain film findings for osteomyelitis. 3. 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 Electronically Signed   By: Marijo Sanes M.D.   On: 02/14/2020 05:08    Procedures Procedures    Medications Ordered in ED Medications  remdesivir 200 mg in sodium chloride 0.9% 250 mL IVPB (has no administration in time range)    Followed by  remdesivir 100 mg in sodium chloride 0.9 % 100 mL IVPB (has no administration in time range)  dexamethasone (DECADRON) injection 6 mg (has no administration in time range)  acetaminophen (TYLENOL) tablet 650 mg (650 mg Oral Given 02/14/20 0319)  ondansetron (ZOFRAN-ODT) disintegrating tablet 8 mg (4 mg Oral Given 02/14/20 0318)  HYDROmorphone (DILAUDID) injection 1 mg (1 mg Intramuscular Given 02/14/20 0318)  HYDROmorphone (DILAUDID) injection 0.5 mg (0.5 mg Intravenous Given 02/14/20 0525)    ED Course  I have reviewed the triage vital signs and the nursing notes.  Pertinent labs & imaging results that were available during my care of the patient were reviewed by me and considered in my medical decision making (see chart for details).    MDM Rules/Calculators/A&P                      3:56 AM Patient chronically ill with history of diabetes/hypertension and ESRD presenting with vomiting.  He denies any fevers at home, but here patient was febrile to 100.5.  Heart and blood pressure have been appropriate.  There is been no hypoxia.  Possible sources for fever could include his left foot as he is supposed to be on antibiotics but has been unable to keep p.o.'s down.  Patient is also on dialysis therefore bacteremia is a possibility Patient will need to be admitted.   Lactate  and blood cultures are pending.  Also ordered x-rays of his left foot as well as a chest x-ray. 5:40 AM Patient found to have Covid pneumonia. Patient currently stable, no respiratory distress at this time. Pulse ox is ranging from 92 to 98% Discussed with Dr. Hal Hope for admission. He requested to start remdesivir and Decadron. Also discussed the case with his mother [name] at [phone] She is unsure if the patient has been vaccinated  [name] was evaluated in Emergency Department on 02/14/2020 for the symptoms described in the history of present illness. He was evaluated in the context of the global COVID-19 pandemic, which necessitated consideration that the patient might be at risk for infection with the SARS-CoV-2 virus that causes COVID-19. Institutional protocols and algorithms that pertain to the evaluation of patients at risk for COVID-19 are in a state of rapid change based on information released by regulatory bodies including the CDC and federal and state organizations. These policies and algorithms were followed during the patient's care in the ED.  Final Clinical Impression(s) / ED Diagnoses Final diagnoses:  Pneumonia due to COVID-19 virus  ESRD (end stage renal disease) [facility])    Rx / DC Orders ED Discharge Orders    None       Ripley Fraise, MD 02/14/20 [phone]

## 2020-02-14 NOTE — Progress Notes (Signed)
Patient refuses cardiac monitoring

## 2020-02-14 NOTE — ED Notes (Signed)
Patient is a very hard stick x3 and reported to nurse [facility] RN

## 2020-02-15 ENCOUNTER — Ambulatory Visit (INDEPENDENT_AMBULATORY_CARE_PROVIDER_SITE_OTHER): Payer: Medicare Other | Admitting: Podiatry

## 2020-02-15 DIAGNOSIS — Z5329 Procedure and treatment not carried out because of patient's decision for other reasons: Secondary | ICD-10-CM

## 2020-02-15 DIAGNOSIS — M14672 Charcot's joint, left ankle and foot: Secondary | ICD-10-CM

## 2020-02-15 DIAGNOSIS — I10 Essential (primary) hypertension: Secondary | ICD-10-CM

## 2020-02-15 DIAGNOSIS — E10621 Type 1 diabetes mellitus with foot ulcer: Secondary | ICD-10-CM

## 2020-02-15 DIAGNOSIS — L97422 Non-pressure chronic ulcer of left heel and midfoot with fat layer exposed: Secondary | ICD-10-CM

## 2020-02-15 LAB — CBC WITH DIFFERENTIAL/PLATELET
Abs Immature Granulocytes: 0.03 10*3/uL (ref 0.00–0.07)
Basophils Absolute: 0 10*3/uL (ref 0.0–0.1)
Basophils Relative: 0 %
Eosinophils Absolute: 0 10*3/uL (ref 0.0–0.5)
Eosinophils Relative: 0 %
HCT: 43.2 % (ref 39.0–52.0)
Hemoglobin: 14 g/dL (ref 13.0–17.0)
Immature Granulocytes: 1 %
Lymphocytes Relative: 21 %
Lymphs Abs: 1.1 10*3/uL (ref 0.7–4.0)
MCH: 30.2 pg (ref 26.0–34.0)
MCHC: 32.4 g/dL (ref 30.0–36.0)
MCV: 93.3 fL (ref 80.0–100.0)
Monocytes Absolute: 0.6 10*3/uL (ref 0.1–1.0)
Monocytes Relative: 11 %
Neutro Abs: 3.5 10*3/uL (ref 1.7–7.7)
Neutrophils Relative %: 67 %
Platelets: 146 10*3/uL — ABNORMAL LOW (ref 150–400)
RBC: 4.63 MIL/uL (ref 4.22–5.81)
RDW: 13.5 % (ref 11.5–15.5)
WBC: 5.3 10*3/uL (ref 4.0–10.5)
nRBC: 0.6 % — ABNORMAL HIGH (ref 0.0–0.2)

## 2020-02-15 LAB — COMPREHENSIVE METABOLIC PANEL
ALT: 16 U/L (ref 0–44)
AST: 29 U/L (ref 15–41)
Albumin: 2.5 g/dL — ABNORMAL LOW (ref 3.5–5.0)
Alkaline Phosphatase: 95 U/L (ref 38–126)
Anion gap: 17 — ABNORMAL HIGH (ref 5–15)
BUN: 62 mg/dL — ABNORMAL HIGH (ref 6–20)
CO2: 22 mmol/L (ref 22–32)
Calcium: 7.8 mg/dL — ABNORMAL LOW (ref 8.9–10.3)
Chloride: 90 mmol/L — ABNORMAL LOW (ref 98–111)
Creatinine, Ser: 14.68 mg/dL — ABNORMAL HIGH (ref 0.61–1.24)
GFR calc Af Amer: 4 mL/min — ABNORMAL LOW (ref >60)
GFR calc non Af Amer: 4 mL/min — ABNORMAL LOW (ref >60)
Glucose, Bld: 187 mg/dL — ABNORMAL HIGH (ref 70–99)
Potassium: 5.3 mmol/L — ABNORMAL HIGH (ref 3.5–5.1)
Sodium: 129 mmol/L — ABNORMAL LOW (ref 135–145)
Total Bilirubin: 1.7 mg/dL — ABNORMAL HIGH (ref 0.3–1.2)
Total Protein: 6.4 g/dL — ABNORMAL LOW (ref 6.5–8.1)

## 2020-02-15 LAB — GLUCOSE, CAPILLARY
Glucose-Capillary: 172 mg/dL — ABNORMAL HIGH (ref 70–99)
Glucose-Capillary: 67 mg/dL — ABNORMAL LOW (ref 70–99)
Glucose-Capillary: 172 mg/dL — ABNORMAL HIGH (ref 70–99)

## 2020-02-15 LAB — FERRITIN: Ferritin: 2493 ng/mL — ABNORMAL HIGH (ref 24–336)

## 2020-02-15 LAB — C-REACTIVE PROTEIN: CRP: 7.2 mg/dL — ABNORMAL HIGH (ref <1.0)

## 2020-02-15 MED ORDER — CALCITRIOL 0.5 MCG PO CAPS
ORAL_CAPSULE | ORAL | Status: AC
  Administered 2020-02-15: 2.5 ug via ORAL
  Filled 2020-02-15: qty 5

## 2020-02-15 MED ORDER — CHLORHEXIDINE GLUCONATE CLOTH 2 % EX PADS: 6.0000 | MEDICATED_PAD | Freq: Every day | CUTANEOUS | Status: DC

## 2020-02-15 MED ORDER — METOCLOPRAMIDE HCL 5 MG/ML IJ SOLN
5.0000 mg | Freq: Three times a day (TID) | INTRAMUSCULAR | Status: AC
  Administered 2020-02-15 – 2020-02-16 (×2): 5 mg via INTRAVENOUS
  Filled 2020-02-15 (×3): qty 2

## 2020-02-15 MED ORDER — HEPARIN SODIUM (PORCINE) 1000 UNIT/ML IJ SOLN
INTRAMUSCULAR | Status: AC
  Filled 2020-02-15: qty 8

## 2020-02-15 MED ORDER — DEXTROSE 50 % IV SOLN
INTRAVENOUS | Status: AC
  Administered 2020-02-15: 25 mL
  Filled 2020-02-15: qty 50

## 2020-02-15 MED ORDER — ALBUTEROL SULFATE HFA 108 (90 BASE) MCG/ACT IN AERS: 2.0000 | INHALATION_SPRAY | RESPIRATORY_TRACT | Status: DC | PRN

## 2020-02-15 NOTE — Consult Note (Addendum)
[facility] Nurse Consult Note: WOC consult requested for left foot.   Progress notes indicate: "Diabetic foot infection.  Being followed closely by podiatry.  He had an MRI of his left foot 2 weeks ago on 5/5 which did not show any evidence of osteomyelitis.  Will consult podiatry and defer need to repeat MRI to podiatry." Please refer to their team for further plan of care. Please re-consult if further assistance is needed.  Thank-you,  Julien Girt MSN, [place], [place], [place], [place]

## 2020-02-15 NOTE — Progress Notes (Signed)
PROGRESS NOTE                                                                                                                                                                                                             Patient Demographics:    [name], is a 39 y.o. male, DOB - [birth year], BHA:[id number]  Outpatient Primary MD for the patient is Kerin Perna, NP   Admit date - 02/13/2020   LOS - 1  Chief Complaint  Patient presents with  . COVID POSITIVE  . Emesis       Brief Narrative: Patient is a 39 y.o. male with PMHx of with history of ESRD on HD MWF, type I DM, gastroparesis-who presented with cough, nausea and vomiting-found to have COVID-19 pneumonia.  See below for further details.  Significant Events: 5/18>> admit to [facility] for nausea/vomiting-found to have COVID-19 pneumonia  COVID-19 medications: Steroids: 5/19 x 1 Remdesivir: 5/19>>  Antibiotics: Rocephin: 5/19>>  Microbiology data: 5/19: Blood culture>> negative  Significant studies: 5/19: Chest x-ray>> bibasilar infiltrates 5/19: Left foot x-ray 2 views>> no osteomyelitis 5/5: MRI left foot>> no fluid collection or abscess, no definite signs of osteomyelitis.  DVT prophylaxis: SQ heparin/SQ Lovenox  Procedures: None  Consults: None    Subjective:    Charm Barges today feels better-no further nausea or vomiting.  On room air this morning.   Assessment  & Plan :    Covid 19 Viral pneumonia +/-gastroenteritis: Improved-GI symptoms have resolved, remains on room air.  CRP trending down.  Continue remdesivir.  As patient not hypoxic-does not require steroids.  Given significant underlying comorbidities for severe disease-prudent to continue with remdesivir x5 days.  Fever: afebrile Prone/Incentive Spirometry: encouraged incentive spirometry use 3-4/hour. O2 requirements:  SpO2: 100 %   COVID-19 Labs: Recent Labs     02/14/20 1502 02/15/20 0717  DDIMER 1.56*  --   FERRITIN 2,863* 2,493*  CRP 10.1* 7.2*    No results found for: BNP  Recent Labs  Lab 02/14/20 1502  PROCALCITON 13.16    Lab Results  Component Value Date   SARSCOV2NAA POSITIVE (A) 02/14/2020   [place] NEGATIVE 01/31/2020   [place] NEGATIVE 04/24/2019    Nausea/vomiting: Probably related to Covid-continue antiemetics-has history of underlying gastroparesis.  ESRD: On HD MWF-nephrology following and directing care  History of diabetic foot  ulceration: Recent MRI negative for osteomyelitis-x-ray done on admission did not show osteo as well.  Continue wound care.  Do not see any discharge/active signs of infection-but being covered with IV Rocephin.  Blood cultures negative so far.  Hyponatremia: Should improve post HD today.  Repeat electrolytes tomorrow  Hyperkalemia: Will be treated with HD today-repeat electrolytes tomorrow.  DM-1: CBGs relatively stable-continue Lantus 12 units daily, 7 inch of NovoLog with meals and SSI for another day or so-we will follow CBGs and adjust accordingly.  Recent Labs    02/14/20 1621 02/14/20 2133 02/15/20 0800  GLUCAP 250* 216* 172*    ABG:    Component Value Date/Time   HCO3 24.7 04/24/2019 0814   TCO2 26 04/24/2019 0814   ACIDBASEDEF 8.9 (H) 05/27/2015 0500   O2SAT 63.0 04/24/2019 0814    Vent Settings: N/A  Condition - Stable  Family Communication  : Left a voicemail for mother [phone]  Code Status :  Full Code  Diet :  Diet Order            Diet regular Room service appropriate? Yes; Fluid consistency: Thin  Diet effective now               Disposition Plan  :   Status is: Inpatient  Remains inpatient appropriate because:Inpatient level of care appropriate due to severity of illness   Dispo: The patient is from: Home              Anticipated d/c is to: Home              Anticipated d/c date is: 2 days              Patient currently is not  medically stable to d/c.   Barriers to discharge: Complete 5 days of IV Remdesivir  Antimicorbials  :    Anti-infectives (From admission, onward)   Start     Dose/Rate Route Frequency Ordered Stop   02/15/20 1000  remdesivir 100 mg in sodium chloride 0.9 % 100 mL IVPB     100 mg 200 mL/hr over 30 Minutes Intravenous Daily 02/14/20 0519 02/19/20 0959   02/14/20 1300  cefTRIAXone (ROCEPHIN) 1 g in sodium chloride 0.9 % 100 mL IVPB     1 g 200 mL/hr over 30 Minutes Intravenous Every 24 hours 02/14/20 1159     02/14/20 0600  remdesivir 200 mg in sodium chloride 0.9% 250 mL IVPB     200 mg 580 mL/hr over 30 Minutes Intravenous Once 02/14/20 0519 02/14/20 0738      Inpatient Medications  Scheduled Meds: . calcitRIOL  2.5 mcg Oral Q M,W,F-HD  . Chlorhexidine Gluconate Cloth  6 each Topical Q0600  . Chlorhexidine Gluconate Cloth  6 each Topical Q0600  . heparin  5,000 Units Subcutaneous Q8H  . insulin aspart  0-9 Units Subcutaneous TID WC  . insulin aspart  7 Units Subcutaneous TID WC  . insulin glargine  12 Units Subcutaneous Daily  . metoCLOPramide (REGLAN) injection  5 mg Intravenous Q6H  . sevelamer carbonate  4,000 mg Oral TID WC   Continuous Infusions: . cefTRIAXone (ROCEPHIN)  IV 1 g (02/14/20 1248)  . remdesivir 100 mg in NS 100 mL 100 mg (02/15/20 0926)   PRN Meds:.acetaminophen, albuterol, ondansetron **OR** ondansetron (ZOFRAN) IV, oxyCODONE, sevelamer carbonate **AND** sevelamer carbonate   Time Spent in minutes  25  See all Orders from today for further details   Oren Binet M.D on 02/15/2020 at 1:32  PM  To page go to [email or web address] - use universal password  [facility] -  Office  [phone]    Objective:   Vitals:   02/15/20 1145 02/15/20 1200 02/15/20 1230 02/15/20 1300  BP: 132/86 123/85 126/87 132/85  Pulse: 79 78 79 77  Resp:      Temp:      TempSrc:      SpO2:      Weight:      Height:        Wt Readings from Last 3 Encounters:   02/15/20 71.2 kg  02/13/20 70.3 kg  02/01/20 70.3 kg     Intake/Output Summary (Last 24 hours) at 02/15/2020 1332 Last data filed at 02/14/2020 1500 Gross per 24 hour  Intake 100 ml  Output --  Net 100 ml     Physical Exam Gen Exam:Alert awake-not in any distress HEENT:atraumatic, normocephalic Chest: B/L clear to auscultation anteriorly CVS:S1S2 regular Abdomen:soft non tender, non distended Extremities:no edema Neurology: Non focal Skin: no rash   Data Review:    CBC Recent Labs  Lab 02/13/20 1832 02/14/20 1502 02/15/20 0717  WBC 4.6 4.4 5.3  HGB 12.8* 14.9 14.0  HCT 40.5 46.4 43.2  PLT 111* 127* 146*  MCV 96.2 95.9 93.3  MCH 30.4 30.8 30.2  MCHC 31.6 32.1 32.4  RDW 13.8 13.7 13.5  LYMPHSABS  --   --  1.1  MONOABS  --   --  0.6  EOSABS  --   --  0.0  BASOSABS  --   --  0.0    Chemistries  Recent Labs  Lab 02/13/20 1832 02/14/20 1502 02/15/20 0717  NA 129*  --  129*  K 4.7  --  5.3*  CL 90*  --  90*  CO2 26  --  22  GLUCOSE 98  --  187*  BUN 37*  --  62*  CREATININE 12.70* 14.09* 14.68*  CALCIUM 7.7*  --  7.8*  AST 27  --  29  ALT 15  --  16  ALKPHOS 87  --  95  BILITOT 1.0  --  1.7*   ------------------------------------------------------------------------------------------------------------------ No results for input(s): CHOL, HDL, LDLCALC, TRIG, CHOLHDL, LDLDIRECT in the last 72 hours.  Lab Results  Component Value Date   HGBA1C 8.9 (H) 01/31/2020   ------------------------------------------------------------------------------------------------------------------ No results for input(s): TSH, T4TOTAL, T3FREE, THYROIDAB in the last 72 hours.  Invalid input(s): FREET3 ------------------------------------------------------------------------------------------------------------------ Recent Labs    02/14/20 1502 02/15/20 0717  FERRITIN 2,863* 2,493*    Coagulation profile No results for input(s): INR, PROTIME in the last 168  hours.  Recent Labs    02/14/20 1502  DDIMER 1.56*    Cardiac Enzymes No results for input(s): CKMB, TROPONINI, MYOGLOBIN in the last 168 hours.  Invalid input(s): CK ------------------------------------------------------------------------------------------------------------------ No results found for: BNP  Micro Results Recent Results (from the past 240 hour(s))  SARS Coronavirus 2 by RT PCR (hospital order, performed in [facility] hospital lab) Nasopharyngeal Nasopharyngeal Swab     Status: Abnormal   Collection Time: 02/14/20  3:22 AM   Specimen: Nasopharyngeal Swab  Result Value Ref Range Status   SARS Coronavirus 2 POSITIVE (A) NEGATIVE Final    Comment: RESULT CALLED TO, READ BACK BY AND VERIFIED WITH: B OSORIO RN 02/14/20 0453 JDW (NOTE) SARS-CoV-2 target nucleic acids are DETECTED SARS-CoV-2 RNA is generally detectable in upper respiratory specimens  during the acute phase of infection.  Positive results are indicative  of the presence of the  identified virus, but do not rule out bacterial infection or co-infection with other pathogens not detected by the test.  Clinical correlation with patient history and  other diagnostic information is necessary to determine patient infection status.  The expected result is negative. Fact Sheet for Patients:   StrictlyIdeas.no  Fact Sheet for Healthcare Providers:   BankingDealers.co.za   This test is not yet approved or cleared by the [place] FDA and  has been authorized for detection and/or diagnosis of SARS-CoV-2 by FDA under an Emergency Use Authorization (EUA).  This EUA will remain in effect (meaning this test can be used ) for the duration of  the COVID-19 declaration under Section 564(b)(1) of the Act, 21 U.S.C. section 360-bbb-3(b)(1), unless the authorization is terminated or revoked sooner. Performed at [facility], [place] [address], [place],  [place] [address]   Blood culture (routine x 2)     Status: None (Preliminary result)   Collection Time: 02/14/20  2:56 PM   Specimen: BLOOD RIGHT HAND  Result Value Ref Range Status   Specimen Description BLOOD RIGHT HAND  Final   Special Requests   Final    BOTTLES DRAWN AEROBIC AND ANAEROBIC Blood Culture adequate volume   Culture   Final    NO GROWTH < 24 HOURS Performed at [facility], [place] [address], [place], [place] [address]    Report Status PENDING  Incomplete  Blood culture (routine x 2)     Status: None (Preliminary result)   Collection Time: 02/14/20  7:00 PM   Specimen: BLOOD  Result Value Ref Range Status   Specimen Description BLOOD BLOOD RIGHT FOREARM  Final   Special Requests   Final    BOTTLES DRAWN AEROBIC ONLY Blood Culture adequate volume   Culture   Final    NO GROWTH < 12 HOURS Performed at [facility], [place] [address] [place], [place] [address]    Report Status PENDING  Incomplete    Radiology Reports MR FOOT LEFT WO CONTRAST  Result Date: 01/31/2020 CLINICAL DATA:  Plantar lateral hindfoot ulcer near the ankle EXAM: MRI OF THE LEFT FOOT WITHOUT CONTRAST TECHNIQUE: Multiplanar, multisequence MR imaging of the left hindfoot/ankle was performed. No intravenous contrast was administered. Axial and coronal T2 fat saturated sequences and sagittal STIR sequences were performed. Evaluation was extremely limited by patient cooperation throughout the exam. Patient requested early termination of the exam. COMPARISON:  01/27/2020 FINDINGS: Bones/Joint/Cartilage Evaluation is extremely limited by patient motion as well as limited nature of the acquired sequences. Mild marrow edema is seen within the cuboid, middle and lateral cuneiform, and second through fourth metatarsals. Ligaments Not adequately evaluated given limitations of the exam. Muscles and Tendons Not adequately evaluated given limitations of the exam. Soft tissues There is subcutaneous edema involving the  plantar lateral aspect of the midfoot, at the site of soft tissue ulceration. There is no underlying fluid collection or evidence of abscess. IMPRESSION: 1. Soft tissue edema corresponding to the soft tissue ulceration plantar lateral midfoot. No fluid collection or abscess. 2. Mild edema within the tarsals and metatarsals as described above, likely degenerative and reactive. No definite signs of osteomyelitis based on limited evaluation as above. Electronically Signed   By: Randa Ngo M.D.   On: 01/31/2020 18:16   IR Fluoro Guide CV Line Right  Result Date: 01/31/2020 CLINICAL DATA:  End-stage renal disease on hemodialysis. Occluded shunt with elevated potassium. Temporary hemodialysis access required. EXAM: EXAM RIGHT IJ  CATHETER PLACEMENT UNDER ULTRASOUND AND FLUOROSCOPIC GUIDANCE TECHNIQUE: The procedure, risks (including but not limited to bleeding, infection, organ damage, pneumothorax), benefits, and alternatives were explained to the patient. Questions regarding the procedure were encouraged and answered. The patient understands and consents to the procedure. Patency of the right IJ vein was confirmed with ultrasound with image documentation. An appropriate skin site was determined. Skin site was marked. Region was prepped using maximum barrier technique including cap and mask, sterile gown, sterile gloves, large sterile sheet, and Chlorhexidine as cutaneous antisepsis. The region was infiltrated locally with 1% lidocaine. Under real-time ultrasound guidance, the right IJ vein was accessed with a 21 gauge needle; the needle tip within the vein was confirmed with ultrasound image documentation. The needle exchanged over a 018 guidewire for vascular dilator. The guidewire would not advance into the SVC. 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 With the aid of an angled Glidewire, access across the SVC and right atrium into the IVC was achieved.  Vascular dilators were passed which allowed advancement of a 15 cm Mahurkar catheter. This was positioned with the tip at the cavoatrial junction. Spot chest radiograph shows good positioning and no pneumothorax. Catheter was flushed and sutured externally with 0-Prolene sutures. Patient tolerated the procedure well. FLUOROSCOPY TIME:  1 minutes; 84 uGym2 DAP COMPLICATIONS: COMPLICATIONS none IMPRESSION: 1. Technically successful right IJ Mahurkar catheter placement. Electronically Signed   By: Lucrezia Europe M.D.   On: 01/31/2020 17:54   IR US Guide Vasc Access Right  Result Date: 02/01/2020 CLINICAL DATA:  Renal failure. Occluded hemodialysis graft with markedly elevated potassium, requiring alternative access for hemodialysis EXAM: EXAM RIGHT IJ CATHETER PLACEMENT UNDER ULTRASOUND AND FLUOROSCOPIC GUIDANCE TECHNIQUE: The procedure, risks (including but not limited to bleeding, infection, organ damage, pneumothorax), benefits, and alternatives were explained to the patient. Questions regarding the procedure were encouraged and answered. The patient understands and consents to the procedure. Patency of the right IJ vein was confirmed with ultrasound with image documentation. An appropriate skin site was determined. Skin site was marked. Region was prepped using maximum barrier technique including cap and mask, sterile gown, sterile gloves, large sterile sheet, and Chlorhexidine as cutaneous antisepsis. The region was infiltrated locally with 1% lidocaine. Under real-time ultrasound guidance, the right IJ vein was accessed with a 21 gauge needle; the needle tip within the vein was confirmed with ultrasound image documentation. The needle exchanged over a 018 guidewire for vascular dilator. The guidewire advanced preferentially to the left innominate vein and would not easily be directed down the SVC. To exclude occlusive process, limited venography was performed demonstrating stenosis at the confluence of the  brachiocephalic veins and SVC, with patency of the distal SVC into the right atrium. Subsequently, an angled stiff glidewire was negotiated across the stenotic region into the IVC. Over the this, the tract was dilated which allowed advancement of a 15 cm Mahurkar catheter. This was positioned with the tip at the cavoatrial junction. Spot chest radiograph shows good positioning and no pneumothorax. Catheter was flushed and sutured externally with 0-Prolene sutures. Patient tolerated the procedure well. FLUOROSCOPY TIME:  1 min; 84 uGym2 DAP COMPLICATIONS: COMPLICATIONS none IMPRESSION: 1. Technically successful right IJ Mahurkar catheter placement. Electronically Signed   By: Lucrezia Europe M.D.   On: 02/01/2020 10:21   DG Chest Port 1 View  Result Date: 02/14/2020 CLINICAL DATA:  Vomiting. Fever. EXAM: PORTABLE CHEST 1 VIEW COMPARISON:  04/24/2019 FINDINGS: The heart is within  normal limits given the AP projection and portable technique. Mild tortuosity of the thoracic aorta. Vascular stents are noted. Patchy bibasilar lung opacities suspicious for infiltrates. No pleural effusions or pulmonary edema. The bony thorax is grossly intact. IMPRESSION: Bibasilar infiltrates. Electronically Signed   By: Marijo Sanes M.D.   On: 02/14/2020 05:10   DG Foot 2 Views Left  Result Date: 02/14/2020 CLINICAL DATA:  Foot pain and swelling. EXAM: LEFT FOOT - 2 VIEW COMPARISON:  01/27/2020 radiographs and 01/31/2020 MRI FINDINGS: Prior amputations of the mid first metatarsal and most of the fifth metatarsal. No change since prior study. No obvious destructive bony changes to suggest osteomyelitis. 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 IMPRESSION: 1. 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 2. No definite plain film findings for osteomyelitis. 3. 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 Electronically Signed   By: Marijo Sanes M.D.   On: 02/14/2020 05:08   DG Foot Complete Left  Result Date: 01/27/2020 CLINICAL DATA:  Infection. EXAM: LEFT FOOT - COMPLETE 3+ VIEW COMPARISON:  April 24, 2019 FINDINGS: The patient is status post prior amputation of the fifth digit. The patient is status post prior partial amputation of the first digit through the first metatarsal. There are age-indeterminate erosions through the remaining portions of the first metatarsal. There are advanced degenerative changes at the second metatarsophalangeal joint. Vascular calcifications are noted. There is soft tissue swelling about the foot. There is an ulcer at the lateral aspect of the foot at the level of the base of the remaining fifth metatarsal. There are no obvious erosions. There are degenerative changes of the midfoot. There are progressive erosions of the sesamoid bones of the first digit. IMPRESSION: 1. No acute displaced fracture or dislocation. 2. Progressive erosions involving the first metatarsal and sesamoid bones concerning for osteomyelitis. 3. Status post prior amputation of the fifth digit. 4. Ulcer involving the lateral aspect of the foot. 5. Nonspecific soft tissue swelling is noted. Electronically Signed   By: Constance Holster M.D.   On: 01/27/2020 21:50

## 2020-02-15 NOTE — Progress Notes (Signed)
Renal Navigator notified patient's OP HD clinic/[facility] of his COVID positive test on 02/14/20. He will need to treat in isolation on a TTS 3rd shift schedule at discharge. Navigator will follow for discharge planning and inform clinic of plan for patient's return to clinic.  Alphonzo Cruise, [place] Renal Navigator [phone]

## 2020-02-15 NOTE — Progress Notes (Signed)
Hypoglycemic Event  CBG: 67  Treatment: 4 oz juice/soda, patient is refusing to drink juice and states that he needs dextrose  Symptoms: Irritable  Follow-up CBG: Time: Patient was yelling and being uncooperative, blood sugar was not checked CBG Result: NA  Possible Reasons for Event: Inadequate meal intake  Comments/MD notified: Dr. Sloan Leiter was notified.   The patient began to use foul language with the nurse techs. They tried to explain that according to the hypoglycemic protocol, we typically give 4oz of juice or graham crackers as a first response. He continued to use foul language, yell and state that he felt we should be treating his low blood glucose level with candy or dextrose. The doctor was consulted and gave a verbal order for half an amp of D50. After giving the dextrose I again explained to him why we do not just skip to giving D50. We will attempt to recheck his blood glucose level once the patient returns to a calmer state.      Dene Gentry

## 2020-02-15 NOTE — Progress Notes (Signed)
Hospitalized. No show for appt.

## 2020-02-15 NOTE — Progress Notes (Signed)
[facility] Progress Note  Subjective: denies any CP, SOB or cough  Vitals:   02/14/20 1148 02/14/20 1641 02/14/20 2005 02/15/20 0445  BP: (!) 149/98 (!) 143/96 (!) 163/99 (!) 176/102  Pulse: 74 79 79 75  Resp: 16 20 20 18   Temp: 97.8 F (36.6 C) 97.6 F (36.4 C) 97.6 F (36.4 C) 97.8 F (36.6 C)  TempSrc: Oral Oral Oral Oral  SpO2: 100% 100% 100% 100%  Weight:      Height:        Exam: Gen small framed male no distress No jvd or bruits Chest clear bilat to the bases RRR no MRG Abd soft ntnd no mass or ascites +bs Ext no edema, L foot w/ lateral ulcer Neuro is alert, Ox 3 , nf  LUA AVG+ bruit    Home meds:  - rocaltrol 0.5 mwf/ cipro 500 qd 2wks  - novolog 6-7 ac tid/ lantus 12u qd  - renvela 5 qc tid  - prn's/ vitamins/ supplements     OP HD: MWF [facility]   4h  400/800  70kg  2/2 bath  AVG  Hep 5000+ 5000 midrun    - calcitriol 2.25 ug tiw   - no esa   Assessment/ Plan: 1. COVID-19 PNA - CXR w/ basilar infiltrates, started on remdesivir and Rocephin. Per primary team.  2. Nausea/ vomiting - diabetic gastroparesis +/- COVID-19 related. Started on IV reglan and antiemetics. 3. ESRD - on HD MWF. Last HD Monday. Plan HD today off sched and then again tomorrow.  4. Diabetic foot infection - f/b podiatry. MRI on 5/5 did not show any osteomyelitis , per H&P.  5. Type 1 DM - per primary 6. 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 MBC ckd - cont calcitriol      Rob Rohan Juenger 02/15/2020, 10:19 AM   Recent Labs  Lab 02/13/20 1832 02/13/20 1832 02/14/20 1502 02/15/20 0717  K 4.7  --   --  5.3*  BUN 37*  --   --  62*  CREATININE 12.70*   < > 14.09* 14.68*  CALCIUM 7.7*  --   --  7.8*  HGB 12.8*   < > 14.9 14.0   < > = values in this interval not displayed.   Inpatient medications: . calcitRIOL  2.5 mcg Oral Q M,W,F-HD  . Chlorhexidine Gluconate Cloth  6 each Topical Q0600  . heparin  5,000 Units Subcutaneous Q8H  . heparin sodium  (porcine)      . insulin aspart  0-9 Units Subcutaneous TID WC  . insulin aspart  7 Units Subcutaneous TID WC  . insulin glargine  12 Units Subcutaneous Daily  . metoCLOPramide (REGLAN) injection  5 mg Intravenous Q6H  . sevelamer carbonate  4,000 mg Oral TID WC   . cefTRIAXone (ROCEPHIN)  IV 1 g (02/14/20 1248)  . remdesivir 100 mg in NS 100 mL 100 mg (02/15/20 0926)   acetaminophen, albuterol, ondansetron **OR** ondansetron (ZOFRAN) IV, oxyCODONE, sevelamer carbonate **AND** sevelamer carbonate

## 2020-02-16 DIAGNOSIS — L97422 Non-pressure chronic ulcer of left heel and midfoot with fat layer exposed: Secondary | ICD-10-CM

## 2020-02-16 DIAGNOSIS — M14672 Charcot's joint, left ankle and foot: Secondary | ICD-10-CM

## 2020-02-16 DIAGNOSIS — E10621 Type 1 diabetes mellitus with foot ulcer: Secondary | ICD-10-CM

## 2020-02-16 LAB — CBC WITH DIFFERENTIAL/PLATELET
Abs Immature Granulocytes: 0.02 10*3/uL (ref 0.00–0.07)
Basophils Absolute: 0 10*3/uL (ref 0.0–0.1)
Basophils Relative: 0 %
Eosinophils Absolute: 0 10*3/uL (ref 0.0–0.5)
Eosinophils Relative: 1 %
HCT: 38.2 % — ABNORMAL LOW (ref 39.0–52.0)
Hemoglobin: 12.2 g/dL — ABNORMAL LOW (ref 13.0–17.0)
Immature Granulocytes: 0 %
Lymphocytes Relative: 20 %
Lymphs Abs: 1.1 10*3/uL (ref 0.7–4.0)
MCH: 30.2 pg (ref 26.0–34.0)
MCHC: 31.9 g/dL (ref 30.0–36.0)
MCV: 94.6 fL (ref 80.0–100.0)
Monocytes Absolute: 0.8 10*3/uL (ref 0.1–1.0)
Monocytes Relative: 15 %
Neutro Abs: 3.3 10*3/uL (ref 1.7–7.7)
Neutrophils Relative %: 64 %
Platelets: 159 10*3/uL (ref 150–400)
RBC: 4.04 MIL/uL — ABNORMAL LOW (ref 4.22–5.81)
RDW: 13.6 % (ref 11.5–15.5)
WBC: 5.2 10*3/uL (ref 4.0–10.5)
nRBC: 0 % (ref 0.0–0.2)

## 2020-02-16 LAB — COMPREHENSIVE METABOLIC PANEL
ALT: 15 U/L (ref 0–44)
AST: 27 U/L (ref 15–41)
Albumin: 2.5 g/dL — ABNORMAL LOW (ref 3.5–5.0)
Alkaline Phosphatase: 83 U/L (ref 38–126)
Anion gap: 14 (ref 5–15)
BUN: 28 mg/dL — ABNORMAL HIGH (ref 6–20)
CO2: 24 mmol/L (ref 22–32)
Calcium: 7.6 mg/dL — ABNORMAL LOW (ref 8.9–10.3)
Chloride: 93 mmol/L — ABNORMAL LOW (ref 98–111)
Creatinine, Ser: 8.81 mg/dL — ABNORMAL HIGH (ref 0.61–1.24)
GFR calc Af Amer: 8 mL/min — ABNORMAL LOW (ref >60)
GFR calc non Af Amer: 7 mL/min — ABNORMAL LOW (ref >60)
Glucose, Bld: 85 mg/dL (ref 70–99)
Potassium: 4.1 mmol/L (ref 3.5–5.1)
Sodium: 131 mmol/L — ABNORMAL LOW (ref 135–145)
Total Bilirubin: 0.8 mg/dL (ref 0.3–1.2)
Total Protein: 6 g/dL — ABNORMAL LOW (ref 6.5–8.1)

## 2020-02-16 LAB — GLUCOSE, CAPILLARY
Glucose-Capillary: 40 mg/dL — CL (ref 70–99)
Glucose-Capillary: 93 mg/dL (ref 70–99)
Glucose-Capillary: 79 mg/dL (ref 70–99)
Glucose-Capillary: 102 mg/dL — ABNORMAL HIGH (ref 70–99)
Glucose-Capillary: 55 mg/dL — ABNORMAL LOW (ref 70–99)
Glucose-Capillary: 177 mg/dL — ABNORMAL HIGH (ref 70–99)

## 2020-02-16 LAB — PHOSPHORUS: Phosphorus: 5.1 mg/dL — ABNORMAL HIGH (ref 2.5–4.6)

## 2020-02-16 MED ORDER — DEXTROSE 50 % IV SOLN
12.5000 g | INTRAVENOUS | Status: AC
  Administered 2020-02-16: 12.5 g via INTRAVENOUS

## 2020-02-16 MED ORDER — HEPARIN SODIUM (PORCINE) 1000 UNIT/ML IJ SOLN
INTRAMUSCULAR | Status: AC
  Administered 2020-02-16: 5000 [IU]
  Filled 2020-02-16: qty 10

## 2020-02-16 MED ORDER — CALCITRIOL 0.5 MCG PO CAPS
ORAL_CAPSULE | ORAL | Status: AC
  Filled 2020-02-16: qty 5

## 2020-02-16 MED ORDER — INSULIN GLARGINE 100 UNIT/ML ~~LOC~~ SOLN
6.0000 [IU] | Freq: Every day | SUBCUTANEOUS | Status: DC
  Administered 2020-02-17: 6 [IU] via SUBCUTANEOUS
  Filled 2020-02-16 (×2): qty 0.06

## 2020-02-16 MED ORDER — GLUCOSE 4 G PO CHEW
CHEWABLE_TABLET | ORAL | Status: AC
  Administered 2020-02-16: 4 g
  Filled 2020-02-16: qty 1

## 2020-02-16 MED ORDER — INSULIN GLARGINE 100 UNIT/ML ~~LOC~~ SOLN
8.0000 [IU] | Freq: Every day | SUBCUTANEOUS | Status: DC
  Filled 2020-02-16: qty 0.08

## 2020-02-16 MED ORDER — INSULIN ASPART 100 UNIT/ML ~~LOC~~ SOLN
0.0000 [IU] | Freq: Three times a day (TID) | SUBCUTANEOUS | Status: DC
  Administered 2020-02-17: 2 [IU] via SUBCUTANEOUS
  Administered 2020-02-17: 1 [IU] via SUBCUTANEOUS

## 2020-02-16 MED ORDER — COLLAGENASE 250 UNIT/GM EX OINT
TOPICAL_OINTMENT | Freq: Every day | CUTANEOUS | Status: DC
  Administered 2020-02-16: 1 via TOPICAL
  Filled 2020-02-16: qty 30

## 2020-02-16 MED ORDER — DEXTROSE 50 % IV SOLN
INTRAVENOUS | Status: AC
  Filled 2020-02-16: qty 50

## 2020-02-16 NOTE — Progress Notes (Signed)
Pt returned from HD and at this time is refusing to do stand up weight per nephrology request. Pt is upset about his meal tray and blood sugar being low post dialysis and stated "I'm not doing nothing right now until I get food." Will continue to monitor.

## 2020-02-16 NOTE — Progress Notes (Signed)
  Subjective:  Patient ID: [name], male    DOB: [birth year],  MRN: [id number]  Patient seen bedside. Feels he is improving. Denies pedal issues. Objective:   Vitals:   02/16/20 0400 02/16/20 0722  BP: 128/78 125/66  Pulse: 90 86  Resp: 19 18  Temp: 99 F (37.2 C) 98.5 F (36.9 C)  SpO2: 98%    General AA&O x3. Normal mood and affect.  Vascular Dorsalis pedis and posterior tibial pulses 2/4 bilat. Brisk capillary refill to all digits. Pedal hair present.  Neurologic Epicritic sensation grossly diminished.  Dermatologic Large ulcer lateral foot left without warmth erythema fluctuance or crepitus.  5/18 photo   Orthopedic: MMT 5/5 in dorsiflexion, plantarflexion, inversion, and eversion. Normal joint ROM without pain or crepitus.    Assessment & Plan:  Patient was evaluated and treated and all questions answered.  Left foot ulcer without signs of infection -XR reviewed, no need for repeat MRI at this time. -Santyl WTD dressing daily -Continue abx for previous soft tissue infection. No additional abx warranted at d/c. -WBAT in boot only. -Patient to follow up post-discharge. -Please contact with questions or concerns.  Evelina Bucy, DPM  Accessible via secure chat for questions or concerns.

## 2020-02-16 NOTE — Care Management (Signed)
CM attempted to speak with pt about discharge planning including transportation to outpt HD as pt is now COVID positive.  Pt told CM "I don't need anything" and hung up the phone.

## 2020-02-16 NOTE — Progress Notes (Signed)
Renal Navigator contacted patient to ensure that he is aware of his OP HD isolation shift schedule. As soon as patient answered the phone he was swearing at someone in the background. Navigator identified self as "dialysis coordinator" and asked him if he was ok. He said "no, and I don't need this." Navigator asked him to stay on the line to make sure he knows that he needs to dialyze on a 3rd shift TTS schedule at discharge because he cannot dialyze in his regular seat while he has COVID. Patient then hung up the phone. Navigator has notified patient's OP HD clinic/[facility] of potential discharge Sunday.  Alphonzo Cruise, [place] Renal Navigator [phone]

## 2020-02-16 NOTE — Progress Notes (Signed)
PROGRESS NOTE                                                                                                                                                                                                             Patient Demographics:    [name], is a 39 y.o. male, DOB - 07/05/1981, BOF:[id number]  Outpatient Primary MD for the patient is Kerin Perna, NP   Admit date - 02/13/2020   LOS - 2  Chief Complaint  Patient presents with  . COVID POSITIVE  . Emesis       Brief Narrative: Patient is a 39 y.o. male with PMHx of with history of ESRD on HD MWF, type I DM, gastroparesis-who presented with cough, nausea and vomiting-found to have COVID-19 pneumonia.  See below for further details.  Significant Events: 5/18>> admit to [facility] for nausea/vomiting-found to have COVID-19 pneumonia  COVID-19 medications: Steroids: 5/19 x 1 Remdesivir: 5/19>>  Antibiotics: Rocephin: 5/19>>  Microbiology data: 5/19: Blood culture>> negative  Significant studies: 5/19: Chest x-ray>> bibasilar infiltrates 5/19: Left foot x-ray 2 views>> no osteomyelitis 5/5: MRI left foot>> no fluid collection or abscess, no definite signs of osteomyelitis.  DVT prophylaxis: SQ heparin (has refused!)  Procedures: None  Consults: Renal Podiatry   Subjective:   Improved-some cough-no further GI symptoms.   Assessment  & Plan :    Covid 19 Viral pneumonia +/-gastroenteritis: Continues to cough-on room air-GI symptoms have resolved-CRP downtrending.  Given comorbid conditions-and risk for severe disease-reasonable to continue IV remdesivir x5 days.  No role for steroids given lack of hypoxemia.    Fever: afebrile Prone/Incentive Spirometry: encouraged incentive spirometry use 3-4/hour. O2 requirements:  SpO2: 98 %   COVID-19 Labs: Recent Labs    02/14/20 1502 02/15/20 0717  DDIMER 1.56*  --   FERRITIN 2,863* 2,493*    CRP 10.1* 7.2*    No results found for: BNP  Recent Labs  Lab 02/14/20 1502  PROCALCITON 13.16    Lab Results  Component Value Date   SARSCOV2NAA POSITIVE (A) 02/14/2020   [place] NEGATIVE 01/31/2020   [employer] NEGATIVE 04/24/2019    Nausea/vomiting: Probably related to Covid-no longer on scheduled IV Reglan-continue as needed Zofran.  Reportedly has a history of underlying gastroparesis.  Encouraged small portion meals.    ESRD: On HD MWF-nephrology following and directing  care  History of diabetic foot ulceration: Recent MRI negative for osteomyelitis-x-ray done on admission did not show osteo as well.  Continue wound care.  Do not see any discharge/active signs of infection-but being covered with IV Rocephin.  Blood cultures negative so far.  Appreciate podiatry input.  Hyponatremia: Improved with HD.  Follow.  Hyperkalemia: Improved with HD.  Follow.  DM-1 with hypoglycemic episodes (A1c 8.9 on 01/31/2020): Have decreased the Lantus to 8 units-no longer on scheduled NovoLog-on sensitive SSI.  Follow another 24 hours before adjusting any further.  Recent Labs    02/16/20 0316 02/16/20 0351 02/16/20 0655  GLUCAP 40* 93 79    ABG:    Component Value Date/Time   HCO3 24.7 04/24/2019 0814   TCO2 26 04/24/2019 0814   ACIDBASEDEF 8.9 (H) 05/27/2015 0500   O2SAT 63.0 04/24/2019 0814    Vent Settings: N/A  Condition - Stable  Family Communication  : Spoke to mother on 5/20 (returned my call)-[phone]-will update over the next few days  Code Status :  Full Code  Diet :  Diet Order            Diet regular Room service appropriate? Yes; Fluid consistency: Thin  Diet effective now               Disposition Plan  :   Status is: Inpatient  Remains inpatient appropriate because:Inpatient level of care appropriate due to severity of illness   Dispo: The patient is from: Home              Anticipated d/c is to: Home              Anticipated d/c date  is: 2 days              Patient currently is not medically stable to d/c.  Tentative discharge: 5/23   Barriers to discharge: Complete 5 days of IV Remdesivir  Antimicorbials  :    Anti-infectives (From admission, onward)   Start     Dose/Rate Route Frequency Ordered Stop   02/15/20 1000  remdesivir 100 mg in sodium chloride 0.9 % 100 mL IVPB     100 mg 200 mL/hr over 30 Minutes Intravenous Daily 02/14/20 0519 02/19/20 0959   02/14/20 1300  cefTRIAXone (ROCEPHIN) 1 g in sodium chloride 0.9 % 100 mL IVPB     1 g 200 mL/hr over 30 Minutes Intravenous Every 24 hours 02/14/20 1159     02/14/20 0600  remdesivir 200 mg in sodium chloride 0.9% 250 mL IVPB     200 mg 580 mL/hr over 30 Minutes Intravenous Once 02/14/20 0519 02/14/20 0738      Inpatient Medications  Scheduled Meds: . calcitRIOL      . calcitRIOL  2.5 mcg Oral Q M,W,F-HD  . Chlorhexidine Gluconate Cloth  6 each Topical Q0600  . Chlorhexidine Gluconate Cloth  6 each Topical Q0600  . collagenase   Topical Daily  . heparin  5,000 Units Subcutaneous Q8H  . insulin aspart  0-9 Units Subcutaneous TID WC  . insulin glargine  8 Units Subcutaneous Daily  . metoCLOPramide (REGLAN) injection  5 mg Intravenous TID AC  . sevelamer carbonate  4,000 mg Oral TID WC   Continuous Infusions: . cefTRIAXone (ROCEPHIN)  IV Stopped (02/15/20 1830)  . remdesivir 100 mg in NS 100 mL 100 mg (02/16/20 1004)   PRN Meds:.acetaminophen, albuterol, ondansetron **OR** ondansetron (ZOFRAN) IV, oxyCODONE, sevelamer carbonate **AND** sevelamer carbonate   Time Spent  in minutes  25  See all Orders from today for further details   Oren Binet M.D on 02/16/2020 at 3:09 PM  To page go to [email or web address] - use universal password  [facility] -  Office  [phone]    Objective:   Vitals:   02/16/20 1330 02/16/20 1400 02/16/20 1430 02/16/20 1455  BP: (!) 148/85 (!) 142/80 138/90 (!) 165/97  Pulse: 85 79 84 88  Resp:      Temp:     98.8 F (37.1 C)  TempSrc:    Oral  SpO2:      Weight:      Height:        Wt Readings from Last 3 Encounters:  02/16/20 72.7 kg  02/13/20 70.3 kg  02/01/20 70.3 kg     Intake/Output Summary (Last 24 hours) at 02/16/2020 1509 Last data filed at 02/15/2020 2020 Gross per 24 hour  Intake 520 ml  Output 0 ml  Net 520 ml     Physical Exam Gen Exam:Alert awake-not in any distress HEENT:atraumatic, normocephalic Chest: B/L clear to auscultation anteriorly CVS:S1S2 regular Abdomen:soft non tender, non distended Extremities:no edema Neurology: Non focal Skin: no rash   Data Review:    CBC Recent Labs  Lab 02/13/20 1832 02/14/20 1502 02/15/20 0717 02/16/20 1146  WBC 4.6 4.4 5.3 5.2  HGB 12.8* 14.9 14.0 12.2*  HCT 40.5 46.4 43.2 38.2*  PLT 111* 127* 146* 159  MCV 96.2 95.9 93.3 94.6  MCH 30.4 30.8 30.2 30.2  MCHC 31.6 32.1 32.4 31.9  RDW 13.8 13.7 13.5 13.6  LYMPHSABS  --   --  1.1 1.1  MONOABS  --   --  0.6 0.8  EOSABS  --   --  0.0 0.0  BASOSABS  --   --  0.0 0.0    Chemistries  Recent Labs  Lab 02/13/20 1832 02/14/20 1502 02/15/20 0717 02/16/20 1146  NA 129*  --  129* 131*  K 4.7  --  5.3* 4.1  CL 90*  --  90* 93*  CO2 26  --  22 24  GLUCOSE 98  --  187* 85  BUN 37*  --  62* 28*  CREATININE 12.70* 14.09* 14.68* 8.81*  CALCIUM 7.7*  --  7.8* 7.6*  AST 27  --  29 27  ALT 15  --  16 15  ALKPHOS 87  --  95 83  BILITOT 1.0  --  1.7* 0.8   ------------------------------------------------------------------------------------------------------------------ No results for input(s): CHOL, HDL, LDLCALC, TRIG, CHOLHDL, LDLDIRECT in the last 72 hours.  Lab Results  Component Value Date   HGBA1C 8.9 (H) 01/31/2020   ------------------------------------------------------------------------------------------------------------------ No results for input(s): TSH, T4TOTAL, T3FREE, THYROIDAB in the last 72 hours.  Invalid input(s):  FREET3 ------------------------------------------------------------------------------------------------------------------ Recent Labs    02/14/20 1502 02/15/20 0717  FERRITIN 2,863* 2,493*    Coagulation profile No results for input(s): INR, PROTIME in the last 168 hours.  Recent Labs    02/14/20 1502  DDIMER 1.56*    Cardiac Enzymes No results for input(s): CKMB, TROPONINI, MYOGLOBIN in the last 168 hours.  Invalid input(s): CK ------------------------------------------------------------------------------------------------------------------ No results found for: BNP  Micro Results Recent Results (from the past 240 hour(s))  SARS Coronavirus 2 by RT PCR (hospital order, performed in [facility] hospital lab) Nasopharyngeal Nasopharyngeal Swab     Status: Abnormal   Collection Time: 02/14/20  3:22 AM   Specimen: Nasopharyngeal Swab  Result Value Ref Range Status   SARS Coronavirus 2  POSITIVE (A) NEGATIVE Final    Comment: RESULT CALLED TO, READ BACK BY AND VERIFIED WITH: B OSORIO RN 02/14/20 0453 JDW (NOTE) SARS-CoV-2 target nucleic acids are DETECTED SARS-CoV-2 RNA is generally detectable in upper respiratory specimens  during the acute phase of infection.  Positive results are indicative  of the presence of the identified virus, but do not rule out bacterial infection or co-infection with other pathogens not detected by the test.  Clinical correlation with patient history and  other diagnostic information is necessary to determine patient infection status.  The expected result is negative. Fact Sheet for Patients:   StrictlyIdeas.no  Fact Sheet for Healthcare Providers:   BankingDealers.co.za   This test is not yet approved or cleared by the [place] FDA and  has been authorized for detection and/or diagnosis of SARS-CoV-2 by FDA under an Emergency Use Authorization (EUA).  This EUA will remain in effect (meaning  this test can be used ) for the duration of  the COVID-19 declaration under Section 564(b)(1) of the Act, 21 U.S.C. section 360-bbb-3(b)(1), unless the authorization is terminated or revoked sooner. Performed at [facility], [place] [address], [place], [place] [address]   Blood culture (routine x 2)     Status: None (Preliminary result)   Collection Time: 02/14/20  2:56 PM   Specimen: BLOOD RIGHT HAND  Result Value Ref Range Status   Specimen Description BLOOD RIGHT HAND  Final   Special Requests   Final    BOTTLES DRAWN AEROBIC AND ANAEROBIC Blood Culture adequate volume   Culture   Final    NO GROWTH 2 DAYS Performed at [facility], [place] [address] [place], [place] [address]    Report Status PENDING  Incomplete  Blood culture (routine x 2)     Status: None (Preliminary result)   Collection Time: 02/14/20  7:00 PM   Specimen: BLOOD  Result Value Ref Range Status   Specimen Description BLOOD BLOOD RIGHT FOREARM  Final   Special Requests   Final    BOTTLES DRAWN AEROBIC ONLY Blood Culture adequate volume   Culture   Final    NO GROWTH 2 DAYS Performed at [facility], [place] [address], [place], [place] [address]    Report Status PENDING  Incomplete    Radiology Reports MR FOOT LEFT WO CONTRAST  Result Date: 01/31/2020 CLINICAL DATA:  Plantar lateral hindfoot ulcer near the ankle EXAM: MRI OF THE LEFT FOOT WITHOUT CONTRAST TECHNIQUE: Multiplanar, multisequence MR imaging of the left hindfoot/ankle was performed. No intravenous contrast was administered. Axial and coronal T2 fat saturated sequences and sagittal STIR sequences were performed. Evaluation was extremely limited by patient cooperation throughout the exam. Patient requested early termination of the exam. COMPARISON:  01/27/2020 FINDINGS: Bones/Joint/Cartilage Evaluation is extremely limited by patient motion as well as limited nature of the acquired sequences. Mild marrow edema is seen within the cuboid,  middle and lateral cuneiform, and second through fourth metatarsals. Ligaments Not adequately evaluated given limitations of the exam. Muscles and Tendons Not adequately evaluated given limitations of the exam. Soft tissues There is subcutaneous edema involving the plantar lateral aspect of the midfoot, at the site of soft tissue ulceration. There is no underlying fluid collection or evidence of abscess. IMPRESSION: 1. Soft tissue edema corresponding to the soft tissue ulceration plantar lateral midfoot. No fluid collection or abscess. 2. Mild edema within the tarsals and metatarsals as described above, likely degenerative and reactive. No definite signs  of osteomyelitis based on limited evaluation as above. Electronically Signed   By: Randa Ngo M.D.   On: 01/31/2020 18:16   IR Fluoro Guide CV Line Right  Result Date: 01/31/2020 CLINICAL DATA:  End-stage renal disease on hemodialysis. Occluded shunt with elevated potassium. Temporary hemodialysis access required. EXAM: EXAM RIGHT IJ CATHETER PLACEMENT UNDER ULTRASOUND AND FLUOROSCOPIC GUIDANCE TECHNIQUE: The procedure, risks (including but not limited to bleeding, infection, organ damage, pneumothorax), benefits, and alternatives were explained to the patient. Questions regarding the procedure were encouraged and answered. The patient understands and consents to the procedure. Patency of the right IJ vein was confirmed with ultrasound with image documentation. An appropriate skin site was determined. Skin site was marked. Region was prepped using maximum barrier technique including cap and mask, sterile gown, sterile gloves, large sterile sheet, and Chlorhexidine as cutaneous antisepsis. The region was infiltrated locally with 1% lidocaine. Under real-time ultrasound guidance, the right IJ vein was accessed with a 21 gauge needle; the needle tip within the vein was confirmed with ultrasound image documentation. The needle exchanged over a 018 guidewire for  vascular dilator. The guidewire would not advance into the SVC. 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 With the aid of an angled Glidewire, access across the SVC and right atrium into the IVC was achieved. Vascular dilators were passed which allowed advancement of a 15 cm Mahurkar catheter. This was positioned with the tip at the cavoatrial junction. Spot chest radiograph shows good positioning and no pneumothorax. Catheter was flushed and sutured externally with 0-Prolene sutures. Patient tolerated the procedure well. FLUOROSCOPY TIME:  1 minutes; 84 uGym2 DAP COMPLICATIONS: COMPLICATIONS none IMPRESSION: 1. Technically successful right IJ Mahurkar catheter placement. Electronically Signed   By: Lucrezia Europe M.D.   On: 01/31/2020 17:54   IR US Guide Vasc Access Right  Result Date: 02/01/2020 CLINICAL DATA:  Renal failure. Occluded hemodialysis graft with markedly elevated potassium, requiring alternative access for hemodialysis EXAM: EXAM RIGHT IJ CATHETER PLACEMENT UNDER ULTRASOUND AND FLUOROSCOPIC GUIDANCE TECHNIQUE: The procedure, risks (including but not limited to bleeding, infection, organ damage, pneumothorax), benefits, and alternatives were explained to the patient. Questions regarding the procedure were encouraged and answered. The patient understands and consents to the procedure. Patency of the right IJ vein was confirmed with ultrasound with image documentation. An appropriate skin site was determined. Skin site was marked. Region was prepped using maximum barrier technique including cap and mask, sterile gown, sterile gloves, large sterile sheet, and Chlorhexidine as cutaneous antisepsis. The region was infiltrated locally with 1% lidocaine. Under real-time ultrasound guidance, the right IJ vein was accessed with a 21 gauge needle; the needle tip within the vein was confirmed with ultrasound image documentation. The needle exchanged over a 018  guidewire for vascular dilator. The guidewire advanced preferentially to the left innominate vein and would not easily be directed down the SVC. To exclude occlusive process, limited venography was performed demonstrating stenosis at the confluence of the brachiocephalic veins and SVC, with patency of the distal SVC into the right atrium. Subsequently, an angled stiff glidewire was negotiated across the stenotic region into the IVC. Over the this, the tract was dilated which allowed advancement of a 15 cm Mahurkar catheter. This was positioned with the tip at the cavoatrial junction. Spot chest radiograph shows good positioning and no pneumothorax. Catheter was flushed and sutured externally with 0-Prolene sutures. Patient tolerated the procedure well. FLUOROSCOPY TIME:  1 min; 84 uGym2 DAP  COMPLICATIONS: COMPLICATIONS none IMPRESSION: 1. Technically successful right IJ Mahurkar catheter placement. Electronically Signed   By: Lucrezia Europe M.D.   On: 02/01/2020 10:21   DG Chest Port 1 View  Result Date: 02/14/2020 CLINICAL DATA:  Vomiting. Fever. EXAM: PORTABLE CHEST 1 VIEW COMPARISON:  04/24/2019 FINDINGS: The heart is within normal limits given the AP projection and portable technique. Mild tortuosity of the thoracic aorta. Vascular stents are noted. Patchy bibasilar lung opacities suspicious for infiltrates. No pleural effusions or pulmonary edema. The bony thorax is grossly intact. IMPRESSION: Bibasilar infiltrates. Electronically Signed   By: Marijo Sanes M.D.   On: 02/14/2020 05:10   DG Foot 2 Views Left  Result Date: 02/14/2020 CLINICAL DATA:  Foot pain and swelling. EXAM: LEFT FOOT - 2 VIEW COMPARISON:  01/27/2020 radiographs and 01/31/2020 MRI FINDINGS: Prior amputations of the mid first metatarsal and most of the fifth metatarsal. No change since prior study. No obvious destructive bony changes to suggest osteomyelitis. 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 IMPRESSION: 1. 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 2. No definite plain film findings for osteomyelitis. 3. 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 Electronically Signed   By: Marijo Sanes M.D.   On: 02/14/2020 05:08   DG Foot Complete Left  Result Date: 01/27/2020 CLINICAL DATA:  Infection. EXAM: LEFT FOOT - COMPLETE 3+ VIEW COMPARISON:  April 24, 2019 FINDINGS: The patient is status post prior amputation of the fifth digit. The patient is status post prior partial amputation of the first digit through the first metatarsal. There are age-indeterminate erosions through the remaining portions of the first metatarsal. There are advanced degenerative changes at the second metatarsophalangeal joint. Vascular calcifications are noted. There is soft tissue swelling about the foot. There is an ulcer at the lateral aspect of the foot at the level of the base of the remaining fifth metatarsal. There are no obvious erosions. There are degenerative changes of the midfoot. There are progressive erosions of the sesamoid bones of the first digit. IMPRESSION: 1. No acute displaced fracture or dislocation. 2. Progressive erosions involving the first metatarsal and sesamoid bones concerning for osteomyelitis. 3. Status post prior amputation of the fifth digit. 4. Ulcer involving the lateral aspect of the foot. 5. Nonspecific soft tissue swelling is noted. Electronically Signed   By: Constance Holster M.D.   On: 01/27/2020 21:50

## 2020-02-16 NOTE — Progress Notes (Addendum)
Inpatient Diabetes Program Recommendations  AACE/ADA: New Consensus Statement on Inpatient Glycemic Control (2015)  Target Ranges:  Prepandial:   less than 140 mg/dL      Peak postprandial:   less than 180 mg/dL (1-2 hours)      Critically ill patients:  140 - 180 mg/dL   Lab Results  Component Value Date   GLUCAP 79 02/16/2020   HGBA1C 8.9 (H) 01/31/2020    Review of Glycemic Control Results for ARAS, ALBARRAN (MRN [id number]) as of 02/16/2020 10:49  Ref. Range 02/15/2020 16:15 02/15/2020 20:06 02/16/2020 03:16 02/16/2020 03:51 02/16/2020 06:55  Glucose-Capillary Latest Ref Range: 70 - 99 mg/dL 67 (L) 172 (H) 40 (LL) 93 79   Diabetes history: Type 1 DM Outpatient Diabetes medications: Lantus 12 units QD, Novolog 6-7 units BID Current orders for Inpatient glycemic control: Lantus 8 units QD, Novolog 0-9 units TID  Inpatient Diabetes Program Recommendations:    Noted hypoglycemia of 40 mg/dL this AM and subsequent insulin changes.  May want to consider decreasing correction to Novolog 0-6 units TID with renal status.  Will follow.  Thanks, Bronson Curb, MSN, RNC-OB Diabetes Coordinator [phone] (8a-5p)

## 2020-02-16 NOTE — Plan of Care (Signed)
  Problem: Education: Goal: Knowledge of risk factors and measures for prevention of condition will improve Outcome: Progressing   Problem: Coping: Goal: Psychosocial and spiritual needs will be supported Outcome: Progressing   Problem: Respiratory: Goal: Will maintain a patent airway Outcome: Progressing Goal: Complications related to the disease process, condition or treatment will be avoided or minimized Outcome: Progressing   Problem: Education: Goal: Knowledge of General Education information will improve Description: Including pain rating scale, medication(s)/side effects and non-pharmacologic comfort measures Outcome: Progressing   Problem: Health Behavior/Discharge Planning: Goal: Ability to manage health-related needs will improve Outcome: Progressing   Problem: Clinical Measurements: Goal: Ability to maintain clinical measurements within normal limits will improve Outcome: Progressing Goal: Will remain free from infection Outcome: Progressing Goal: Diagnostic test results will improve Outcome: Progressing Goal: Respiratory complications will improve Outcome: Progressing Goal: Cardiovascular complication will be avoided Outcome: Progressing   Problem: Activity: Goal: Risk for activity intolerance will decrease Outcome: Progressing   Problem: Nutrition: Goal: Adequate nutrition will be maintained Outcome: Progressing   Problem: Coping: Goal: Level of anxiety will decrease Outcome: Progressing   Problem: Elimination: Goal: Will not experience complications related to bowel motility Outcome: Progressing Goal: Will not experience complications related to urinary retention Outcome: Progressing   Problem: Pain Managment: Goal: General experience of comfort will improve Outcome: Progressing   Problem: Safety: Goal: Ability to remain free from injury will improve Outcome: Progressing   Problem: Skin Integrity: Goal: Risk for impaired skin integrity will  decrease Outcome: Progressing   

## 2020-02-16 NOTE — Progress Notes (Addendum)
Hypoglycemic Event  CBG: 55  Treatment: D50 25 mL (12.5 gm)  Symptoms: None  Follow-up CBG: Time:1621 CBG Result:102  Possible Reasons for Event: Inadequate meal intake  Comments/MD notified: Dr. Louretta Shorten, [place] [place]

## 2020-02-16 NOTE — Progress Notes (Signed)
[facility] Progress Note  Subjective:  Patient not examined today directly given COVID-19 + status, utilizing data taken from chart +/- discussions w/ providers and staff.   Labs reviewed, no sig issues.    Vitals:   02/16/20 1230 02/16/20 1300 02/16/20 1330 02/16/20 1400  BP: (!) 164/93 (!) 147/86 (!) 148/85 (!) 142/80  Pulse: 83 87 85 79  Resp:      Temp:      TempSrc:      SpO2:      Weight:      Height:        Exam:  Patient not examined today directly given COVID-19 + status, utilizing data taken from chart +/- discussions w/ providers and staff.      Home meds:  - rocaltrol 0.5 mwf/ cipro 500 qd 2wks  - novolog 6-7 ac tid/ lantus 12u qd  - renvela 5 qc tid  - prn's/ vitamins/ supplements     OP HD: MWF [facility]   4h  400/800  70kg  2/2 bath  AVG  Hep 5000+ 5000 midrun    - calcitriol 2.25 ug tiw   - no esa   Assessment/ Plan: 1. COVID-19 PNA - CXR w/ basilar infiltrates, started on remdesivir and Rocephin. Per primary team.  2. Nausea/ vomiting - diabetic gastroparesis +/- COVID-19 related. Started on IV reglan and antiemetics. Per primary.   3. ESRD - on HD MWF. HD today on schedule. Labs look good.  4. Diabetic foot infection - f/b podiatry. MRI on 5/5 did not show any osteomyelitis , per H&P.  5. Type 1 DM - per primary 6. 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 MBC ckd - cont calcitriol      Rob Schertz 02/16/2020, 2:37 PM   Recent Labs  Lab 02/15/20 0717 02/16/20 1146  K 5.3* 4.1  BUN 62* 28*  CREATININE 14.68* 8.81*  CALCIUM 7.8* 7.6*  PHOS  --  5.1*  HGB 14.0 12.2*   Inpatient medications: . calcitRIOL      . calcitRIOL  2.5 mcg Oral Q M,W,F-HD  . Chlorhexidine Gluconate Cloth  6 each Topical Q0600  . Chlorhexidine Gluconate Cloth  6 each Topical Q0600  . collagenase   Topical Daily  . heparin  5,000 Units Subcutaneous Q8H  . heparin sodium (porcine)      . insulin aspart  0-9 Units Subcutaneous TID WC  .  insulin glargine  8 Units Subcutaneous Daily  . metoCLOPramide (REGLAN) injection  5 mg Intravenous TID AC  . sevelamer carbonate  4,000 mg Oral TID WC   . cefTRIAXone (ROCEPHIN)  IV Stopped (02/15/20 1830)  . remdesivir 100 mg in NS 100 mL 100 mg (02/16/20 1004)   acetaminophen, albuterol, ondansetron **OR** ondansetron (ZOFRAN) IV, oxyCODONE, sevelamer carbonate **AND** sevelamer carbonate

## 2020-02-16 NOTE — Progress Notes (Signed)
Pt's nurse informed me that Pt wanted to speak with the charge nurse regarding some issues around dialysis, his low blood sugars and eating meals. Upon going into the Pt's room, he was refusing to let the nurse recheck his blood sugar and then went into a long rant, cussing and yelling at the top of his lungs how we keep letting his blood sugars get low and not treating them the way he prefers. This RN tried to explain that we have policies that we have to follow when treating low blood sugars and asked him what it was that was the issue so that I could better help and understand him. Pt would hardly let me speak, still screaming and cussing so I told him I would call security. He said go ahead. Security was called, came to the room. Will continue to monitor.

## 2020-02-17 DIAGNOSIS — U071 COVID-19: Principal | ICD-10-CM

## 2020-02-17 DIAGNOSIS — N186 End stage renal disease: Secondary | ICD-10-CM

## 2020-02-17 DIAGNOSIS — J1282 Pneumonia due to coronavirus disease 2019: Secondary | ICD-10-CM

## 2020-02-17 DIAGNOSIS — L089 Local infection of the skin and subcutaneous tissue, unspecified: Secondary | ICD-10-CM

## 2020-02-17 DIAGNOSIS — E11628 Type 2 diabetes mellitus with other skin complications: Secondary | ICD-10-CM

## 2020-02-17 LAB — GLUCOSE, CAPILLARY
Glucose-Capillary: 231 mg/dL — ABNORMAL HIGH (ref 70–99)
Glucose-Capillary: 160 mg/dL — ABNORMAL HIGH (ref 70–99)
Glucose-Capillary: 78 mg/dL (ref 70–99)
Glucose-Capillary: 136 mg/dL — ABNORMAL HIGH (ref 70–99)
Glucose-Capillary: 43 mg/dL — CL (ref 70–99)
Glucose-Capillary: 96 mg/dL (ref 70–99)
Glucose-Capillary: 62 mg/dL — ABNORMAL LOW (ref 70–99)
Glucose-Capillary: 93 mg/dL (ref 70–99)

## 2020-02-17 MED ORDER — DEXTROSE 50 % IV SOLN
INTRAVENOUS | Status: AC
  Filled 2020-02-17: qty 50

## 2020-02-17 MED ORDER — GLUCAGON HCL RDNA (DIAGNOSTIC) 1 MG IJ SOLR
INTRAMUSCULAR | Status: AC
  Administered 2020-02-17: 1 mg via SUBCUTANEOUS
  Filled 2020-02-17: qty 1

## 2020-02-17 MED ORDER — DEXTROSE 50 % IV SOLN
INTRAVENOUS | Status: AC
  Administered 2020-02-17: 50 mL
  Filled 2020-02-17: qty 50

## 2020-02-17 MED ORDER — GLUCAGON HCL RDNA (DIAGNOSTIC) 1 MG IJ SOLR: 1.0000 mg | Freq: Once | INTRAMUSCULAR | Status: AC | PRN

## 2020-02-17 MED ORDER — AMLODIPINE BESYLATE 10 MG PO TABS
10.0000 mg | ORAL_TABLET | Freq: Every day | ORAL | Status: DC
  Administered 2020-02-17: 10 mg via ORAL
  Filled 2020-02-17 (×2): qty 1

## 2020-02-17 NOTE — Progress Notes (Signed)
Hypoglycemic Event  CBG: 43  Treatment: D50 50 mL (25 gm)  Symptoms: lethargic  Follow-up CBG: Time:1828 CBG Result:136  Possible Reasons for Event: Inadequate meal intake  Comments/MD notified:Dr. Candiss Norse paged. Encouraged pt to order dinner and eat snacks to keep sugar elevated.   Jeanella Craze

## 2020-02-17 NOTE — Progress Notes (Signed)
Hypoglycemic Event  CBG: 62 @2239   Treatment: Glucagon SQ 1mg   Symptoms: Pale, Shaky, Nervous/irritable and Lethargic  Follow-up CBG: Time: 2308 CBG Result: 93  Possible Reasons for Event: Inadequate meal intake  Comments/MD notified: T. Opyd, MD @2300   Pt was uncooperative earlier in the night w/ attempts to assist in preventing a hypoglycemic event. After pt checked his own CBG, he began stuttering and became lethargic. Pt refused PO interventions and IV access was lost. IV team contacted and other interventions pursued in the meantime.     [name] A [name]

## 2020-02-17 NOTE — Progress Notes (Signed)
[facility] Progress Note  Subjective:    Patient not examined today directly given COVID-19 + status, utilizing data taken from chart +/- discussions w/ providers and staff.     Vitals:   02/16/20 2315 02/17/20 0543 02/17/20 1626 02/17/20 2020  BP:  (!) 150/105 (!) 128/59 137/85  Pulse:  89 84 84  Resp:  18  18  Temp:  98.1 F (36.7 C) 98.2 F (36.8 C) (!) 97.4 F (36.3 C)  TempSrc:  Oral Oral Oral  SpO2:  98% 100% 100%  Weight: 67.9 kg     Height:        Exam:  Patient not examined today directly given COVID-19 + status, utilizing data taken from chart +/- discussions w/ providers and staff.      Home meds:  - rocaltrol 0.5 mwf/ cipro 500 qd 2wks  - novolog 6-7 ac tid/ lantus 12u qd  - renvela 5 qc tid  - prn's/ vitamins/ supplements     OP HD: MWF [facility]   4h  400/800  70kg  2/2 bath  AVG  Hep 5000+ 5000 midrun    - calcitriol 2.25 ug tiw   - no esa   Assessment/ Plan: 1. COVID-19 PNA - CXR w/ basilar infiltrates, started on remdesivir and Rocephin. Per primary team.  2. Nausea/ vomiting - diabetic gastroparesis +/- COVID-19 related. Started on IV reglan and antiemetics. Per primary.   3. ESRD - on HD MWF. Next HD Monday.  4. Diabetic foot infection - f/b podiatry. MRI on 5/5 did not show any osteomyelitis , per H&P. Podiatry following.  5. Type 1 DM - per primary 6. 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 MBC ckd - cont calcitriol 8. HTN - started on norvasc 9. Vol - no vol excess on last exam. 3kg under dry wt.       Rob Fabian Coca 02/17/2020, 8:55 PM   Recent Labs  Lab 02/15/20 0717 02/16/20 1146  K 5.3* 4.1  BUN 62* 28*  CREATININE 14.68* 8.81*  CALCIUM 7.8* 7.6*  PHOS  --  5.1*  HGB 14.0 12.2*   Inpatient medications: . amLODipine  10 mg Oral Daily  . calcitRIOL  2.5 mcg Oral Q M,W,F-HD  . Chlorhexidine Gluconate Cloth  6 each Topical Q0600  . Chlorhexidine Gluconate Cloth  6 each Topical Q0600  . collagenase    Topical Daily  . heparin  5,000 Units Subcutaneous Q8H  . insulin aspart  0-6 Units Subcutaneous TID WC  . insulin glargine  6 Units Subcutaneous Daily  . sevelamer carbonate  4,000 mg Oral TID WC   . cefTRIAXone (ROCEPHIN)  IV 1 g (02/17/20 1225)  . remdesivir 100 mg in NS 100 mL 100 mg (02/17/20 0953)   acetaminophen, albuterol, ondansetron **OR** ondansetron (ZOFRAN) IV, oxyCODONE, sevelamer carbonate **AND** sevelamer carbonate

## 2020-02-17 NOTE — Progress Notes (Signed)
Patient found in hallway yelling and cussing. As staff approached patient, he continued to be verbally abusive. Patient upset about meals and how they've been late and/or cold since he's been there. Patient stated he spoke to someone at service response who stated they will come up to bedside and speak with the patient as they deliver him a new tray. New tray brought in by bedside nurse per protocol and patient upset that it was not delivered as promised. Educated patient that service response does not have access to his medical records so it was possible they didn't know he was in covid isolation when that was mentioned. Patient continuously interrupting staff members when they try to speak and being extremely inappropriate to staff. Security called to help deesculate patient. Patient escorted back to his room.

## 2020-02-17 NOTE — Progress Notes (Signed)
PROGRESS NOTE                                                                                                                                                                                                             Patient Demographics:    [name], is a 39 y.o. male, DOB - 1981-07-20, EUM:[id number]  Outpatient Primary MD for the patient is Kerin Perna, NP   Admit date - 02/13/2020   LOS - 3  Chief Complaint  Patient presents with  . COVID POSITIVE  . Emesis       Brief Narrative: Patient is a 39 y.o. male with PMHx of with history of ESRD on HD MWF, type I DM, gastroparesis-who presented with cough, nausea and vomiting-found to have COVID-19 pneumonia.  See below for further details.  Significant Events: 5/18>> admit to [facility] for nausea/vomiting-found to have COVID-19 pneumonia  COVID-19 medications: Steroids: 5/19 x 1 Remdesivir: 5/19>>  Antibiotics: Rocephin: 5/19>>  Microbiology data: 5/19: Blood culture>> negative  Significant studies: 5/19: Chest x-ray>> bibasilar infiltrates 5/19: Left foot x-ray 2 views>> no osteomyelitis 5/5: MRI left foot>> no fluid collection or abscess, no definite signs of osteomyelitis.  DVT prophylaxis: SQ heparin (has refused!)  Procedures: None  Consults: Renal Podiatry   Subjective:   Patient in bed, appears comfortable, denies any headache, no fever, no chest pain or pressure, no shortness of breath , no abdominal pain. No focal weakness.   Assessment  & Plan :    Covid 19 Viral pneumonia +/-gastroenteritis: Continues to cough-on room air-GI symptoms have resolved-CRP downtrending.  Given comorbid conditions-and risk for severe disease-reasonable to continue IV remdesivir x5 days.  No role for steroids given lack of hypoxemia.    Fever: afebrile Prone/Incentive Spirometry: encouraged incentive spirometry use 3-4/hour. O2 requirements:  SpO2: 98 %    COVID-19 Labs: Recent Labs    02/14/20 1502 02/15/20 0717  DDIMER 1.56*  --   FERRITIN 2,863* 2,493*  CRP 10.1* 7.2*    No results found for: BNP  Recent Labs  Lab 02/14/20 1502  PROCALCITON 13.16    Lab Results  Component Value Date   SARSCOV2NAA POSITIVE (A) 02/14/2020   [place] NEGATIVE 01/31/2020   [place] NEGATIVE 04/24/2019    Nausea/vomiting: Probably related to Covid-no longer on scheduled IV Reglan-continue as needed Zofran.  Reportedly has  a history of underlying gastroparesis.  Encouraged small portion meals.    ESRD: On HD MWF-nephrology following and directing care  History of diabetic foot ulceration: Recent MRI negative for osteomyelitis-x-ray done on admission did not show osteo as well.  Continue wound care.  Do not see any discharge/active signs of infection-but being covered with IV Rocephin.  Blood cultures negative so far.  Appreciate podiatry input.  Hyponatremia: Improved with HD.  Follow.  Hyperkalemia: Improved with HD.  Follow.  HTN - placed on Norvasc  DM-1 with hypoglycemic episodes (A1c 8.9 on 01/31/2020): Have decreased the Lantus to 8 units-no longer on scheduled NovoLog-on sensitive SSI.  Follow another 24 hours before adjusting any further.  Recent Labs    02/16/20 1621 02/16/20 2044 02/17/20 0832  GLUCAP 102* 177* 231*    ABG:    Component Value Date/Time   HCO3 24.7 04/24/2019 0814   TCO2 26 04/24/2019 0814   ACIDBASEDEF 8.9 (H) 05/27/2015 0500   O2SAT 63.0 04/24/2019 0814    Vent Settings: N/A  Condition - Stable  Family Communication  : Spoke to mother on 5/20 (returned my call)-[phone]-will update over the next few days  Code Status :  Full Code  Diet :  Diet Order            Diet regular Room service appropriate? Yes; Fluid consistency: Thin  Diet effective now               Disposition Plan  :   Status is: Inpatient  Remains inpatient appropriate because:Inpatient level of care appropriate  due to severity of illness   Dispo: The patient is from: Home              Anticipated d/c is to: Home              Anticipated d/c date is: 1 day              Patient currently is not medically stable to d/c.  Tentative discharge: 5/23   Barriers to discharge: Complete 5 days of IV Remdesivir  Antimicorbials  :    Anti-infectives (From admission, onward)   Start     Dose/Rate Route Frequency Ordered Stop   02/15/20 1000  remdesivir 100 mg in sodium chloride 0.9 % 100 mL IVPB     100 mg 200 mL/hr over 30 Minutes Intravenous Daily 02/14/20 0519 02/19/20 0959   02/14/20 1300  cefTRIAXone (ROCEPHIN) 1 g in sodium chloride 0.9 % 100 mL IVPB     1 g 200 mL/hr over 30 Minutes Intravenous Every 24 hours 02/14/20 1159     02/14/20 0600  remdesivir 200 mg in sodium chloride 0.9% 250 mL IVPB     200 mg 580 mL/hr over 30 Minutes Intravenous Once 02/14/20 0519 02/14/20 0738      Inpatient Medications  Scheduled Meds: . amLODipine  10 mg Oral Daily  . calcitRIOL  2.5 mcg Oral Q M,W,F-HD  . Chlorhexidine Gluconate Cloth  6 each Topical Q0600  . Chlorhexidine Gluconate Cloth  6 each Topical Q0600  . collagenase   Topical Daily  . heparin  5,000 Units Subcutaneous Q8H  . insulin aspart  0-6 Units Subcutaneous TID WC  . insulin glargine  6 Units Subcutaneous Daily  . sevelamer carbonate  4,000 mg Oral TID WC   Continuous Infusions: . cefTRIAXone (ROCEPHIN)  IV 1 g (02/16/20 1559)  . remdesivir 100 mg in NS 100 mL 100 mg (02/17/20 0953)   PRN  Meds:.acetaminophen, albuterol, ondansetron **OR** ondansetron (ZOFRAN) IV, oxyCODONE, sevelamer carbonate **AND** sevelamer carbonate   Time Spent in minutes  25  See all Orders from today for further details   Lala Lund M.D on 02/17/2020 at 11:45 AM  To page go to [email or web address] - use universal password  [facility] -  Office  [phone]    Objective:   Vitals:   02/16/20 1455 02/16/20 1540 02/16/20 2315 02/17/20 0543    BP: (!) 165/97 (!) 137/123  (!) 150/105  Pulse: 88 95  89  Resp:    18  Temp: 98.8 F (37.1 C) (!) 97.5 F (36.4 C)  98.1 F (36.7 C)  TempSrc: Oral Oral  Oral  SpO2: 98% 100%  98%  Weight: 70.3 kg  67.9 kg   Height:        Wt Readings from Last 3 Encounters:  02/16/20 67.9 kg  02/13/20 70.3 kg  02/01/20 70.3 kg     Intake/Output Summary (Last 24 hours) at 02/17/2020 1145 Last data filed at 02/16/2020 1859 Gross per 24 hour  Intake 120 ml  Output 2000 ml  Net -1880 ml     Physical Exam  Awake Alert, No new F.N deficits, Normal affect [place].AT,PERRAL Supple Neck,No JVD, No cervical lymphadenopathy appriciated.  Symmetrical Chest wall movement, Good air movement bilaterally, CTAB RRR,No Gallops, Rubs or new Murmurs, No Parasternal Heave +ve B.Sounds, Abd Soft, No tenderness, No organomegaly appriciated, No rebound - guarding or rigidity. No Cyanosis, L foot under bandage    Data Review:    CBC Recent Labs  Lab 02/13/20 1832 02/14/20 1502 02/15/20 0717 02/16/20 1146  WBC 4.6 4.4 5.3 5.2  HGB 12.8* 14.9 14.0 12.2*  HCT 40.5 46.4 43.2 38.2*  PLT 111* 127* 146* 159  MCV 96.2 95.9 93.3 94.6  MCH 30.4 30.8 30.2 30.2  MCHC 31.6 32.1 32.4 31.9  RDW 13.8 13.7 13.5 13.6  LYMPHSABS  --   --  1.1 1.1  MONOABS  --   --  0.6 0.8  EOSABS  --   --  0.0 0.0  BASOSABS  --   --  0.0 0.0    Chemistries  Recent Labs  Lab 02/13/20 1832 02/14/20 1502 02/15/20 0717 02/16/20 1146  NA 129*  --  129* 131*  K 4.7  --  5.3* 4.1  CL 90*  --  90* 93*  CO2 26  --  22 24  GLUCOSE 98  --  187* 85  BUN 37*  --  62* 28*  CREATININE 12.70* 14.09* 14.68* 8.81*  CALCIUM 7.7*  --  7.8* 7.6*  AST 27  --  29 27  ALT 15  --  16 15  ALKPHOS 87  --  95 83  BILITOT 1.0  --  1.7* 0.8   ------------------------------------------------------------------------------------------------------------------ No results for input(s): CHOL, HDL, LDLCALC, TRIG, CHOLHDL, LDLDIRECT in the last 72  hours.  Lab Results  Component Value Date   HGBA1C 8.9 (H) 01/31/2020   ------------------------------------------------------------------------------------------------------------------ No results for input(s): TSH, T4TOTAL, T3FREE, THYROIDAB in the last 72 hours.  Invalid input(s): FREET3 ------------------------------------------------------------------------------------------------------------------ Recent Labs    02/14/20 1502 02/15/20 0717  FERRITIN 2,863* 2,493*    Coagulation profile No results for input(s): INR, PROTIME in the last 168 hours.  Recent Labs    02/14/20 1502  DDIMER 1.56*    Cardiac Enzymes No results for input(s): CKMB, TROPONINI, MYOGLOBIN in the last 168 hours.  Invalid input(s): CK ------------------------------------------------------------------------------------------------------------------ No results found for: BNP  Micro  Results Recent Results (from the past 240 hour(s))  SARS Coronavirus 2 by RT PCR (hospital order, performed in [facility] hospital lab) Nasopharyngeal Nasopharyngeal Swab     Status: Abnormal   Collection Time: 02/14/20  3:22 AM   Specimen: Nasopharyngeal Swab  Result Value Ref Range Status   SARS Coronavirus 2 POSITIVE (A) NEGATIVE Final    Comment: RESULT CALLED TO, READ BACK BY AND VERIFIED WITH: B OSORIO RN 02/14/20 0453 JDW (NOTE) SARS-CoV-2 target nucleic acids are DETECTED SARS-CoV-2 RNA is generally detectable in upper respiratory specimens  during the acute phase of infection.  Positive results are indicative  of the presence of the identified virus, but do not rule out bacterial infection or co-infection with other pathogens not detected by the test.  Clinical correlation with patient history and  other diagnostic information is necessary to determine patient infection status.  The expected result is negative. Fact Sheet for Patients:   StrictlyIdeas.no  Fact Sheet for Healthcare  Providers:   BankingDealers.co.za   This test is not yet approved or cleared by the [place] FDA and  has been authorized for detection and/or diagnosis of SARS-CoV-2 by FDA under an Emergency Use Authorization (EUA).  This EUA will remain in effect (meaning this test can be used ) for the duration of  the COVID-19 declaration under Section 564(b)(1) of the Act, 21 U.S.C. section 360-bbb-3(b)(1), unless the authorization is terminated or revoked sooner. Performed at [facility], [place] [name] [place], [place] [address]   Blood culture (routine x 2)     Status: None (Preliminary result)   Collection Time: 02/14/20  2:56 PM   Specimen: BLOOD RIGHT HAND  Result Value Ref Range Status   Specimen Description BLOOD RIGHT HAND  Final   Special Requests   Final    BOTTLES DRAWN AEROBIC AND ANAEROBIC Blood Culture adequate volume   Culture   Final    NO GROWTH 3 DAYS Performed at [facility], [place] [address], [place], [place] [address]    Report Status PENDING  Incomplete  Blood culture (routine x 2)     Status: None (Preliminary result)   Collection Time: 02/14/20  7:00 PM   Specimen: BLOOD  Result Value Ref Range Status   Specimen Description BLOOD BLOOD RIGHT FOREARM  Final   Special Requests   Final    BOTTLES DRAWN AEROBIC ONLY Blood Culture adequate volume   Culture   Final    NO GROWTH 3 DAYS Performed at [facility], [place] [address] [place], [place] [address]    Report Status PENDING  Incomplete    Radiology Reports MR FOOT LEFT WO CONTRAST  Result Date: 01/31/2020 CLINICAL DATA:  Plantar lateral hindfoot ulcer near the ankle EXAM: MRI OF THE LEFT FOOT WITHOUT CONTRAST TECHNIQUE: Multiplanar, multisequence MR imaging of the left hindfoot/ankle was performed. No intravenous contrast was administered. Axial and coronal T2 fat saturated sequences and sagittal STIR sequences were performed. Evaluation was extremely limited by patient  cooperation throughout the exam. Patient requested early termination of the exam. COMPARISON:  01/27/2020 FINDINGS: Bones/Joint/Cartilage Evaluation is extremely limited by patient motion as well as limited nature of the acquired sequences. Mild marrow edema is seen within the cuboid, middle and lateral cuneiform, and second through fourth metatarsals. Ligaments Not adequately evaluated given limitations of the exam. Muscles and Tendons Not adequately evaluated given limitations of the exam. Soft tissues There is subcutaneous edema involving the plantar lateral aspect of  the midfoot, at the site of soft tissue ulceration. There is no underlying fluid collection or evidence of abscess. IMPRESSION: 1. Soft tissue edema corresponding to the soft tissue ulceration plantar lateral midfoot. No fluid collection or abscess. 2. Mild edema within the tarsals and metatarsals as described above, likely degenerative and reactive. No definite signs of osteomyelitis based on limited evaluation as above. Electronically Signed   By: Randa Ngo M.D.   On: 01/31/2020 18:16   IR Fluoro Guide CV Line Right  Result Date: 01/31/2020 CLINICAL DATA:  End-stage renal disease on hemodialysis. Occluded shunt with elevated potassium. Temporary hemodialysis access required. EXAM: EXAM RIGHT IJ CATHETER PLACEMENT UNDER ULTRASOUND AND FLUOROSCOPIC GUIDANCE TECHNIQUE: The procedure, risks (including but not limited to bleeding, infection, organ damage, pneumothorax), benefits, and alternatives were explained to the patient. Questions regarding the procedure were encouraged and answered. The patient understands and consents to the procedure. Patency of the right IJ vein was confirmed with ultrasound with image documentation. An appropriate skin site was determined. Skin site was marked. Region was prepped using maximum barrier technique including cap and mask, sterile gown, sterile gloves, large sterile sheet, and Chlorhexidine as cutaneous  antisepsis. The region was infiltrated locally with 1% lidocaine. Under real-time ultrasound guidance, the right IJ vein was accessed with a 21 gauge needle; the needle tip within the vein was confirmed with ultrasound image documentation. The needle exchanged over a 018 guidewire for vascular dilator. The guidewire would not advance into the SVC. 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 With the aid of an angled Glidewire, access across the SVC and right atrium into the IVC was achieved. Vascular dilators were passed which allowed advancement of a 15 cm Mahurkar catheter. This was positioned with the tip at the cavoatrial junction. Spot chest radiograph shows good positioning and no pneumothorax. Catheter was flushed and sutured externally with 0-Prolene sutures. Patient tolerated the procedure well. FLUOROSCOPY TIME:  1 minutes; 84 uGym2 DAP COMPLICATIONS: COMPLICATIONS none IMPRESSION: 1. Technically successful right IJ Mahurkar catheter placement. Electronically Signed   By: Lucrezia Europe M.D.   On: 01/31/2020 17:54   IR US Guide Vasc Access Right  Result Date: 02/01/2020 CLINICAL DATA:  Renal failure. Occluded hemodialysis graft with markedly elevated potassium, requiring alternative access for hemodialysis EXAM: EXAM RIGHT IJ CATHETER PLACEMENT UNDER ULTRASOUND AND FLUOROSCOPIC GUIDANCE TECHNIQUE: The procedure, risks (including but not limited to bleeding, infection, organ damage, pneumothorax), benefits, and alternatives were explained to the patient. Questions regarding the procedure were encouraged and answered. The patient understands and consents to the procedure. Patency of the right IJ vein was confirmed with ultrasound with image documentation. An appropriate skin site was determined. Skin site was marked. Region was prepped using maximum barrier technique including cap and mask, sterile gown, sterile gloves, large sterile sheet, and Chlorhexidine  as cutaneous antisepsis. The region was infiltrated locally with 1% lidocaine. Under real-time ultrasound guidance, the right IJ vein was accessed with a 21 gauge needle; the needle tip within the vein was confirmed with ultrasound image documentation. The needle exchanged over a 018 guidewire for vascular dilator. The guidewire advanced preferentially to the left innominate vein and would not easily be directed down the SVC. To exclude occlusive process, limited venography was performed demonstrating stenosis at the confluence of the brachiocephalic veins and SVC, with patency of the distal SVC into the right atrium. Subsequently, an angled stiff glidewire was negotiated across the stenotic region into the IVC. Over  the this, the tract was dilated which allowed advancement of a 15 cm Mahurkar catheter. This was positioned with the tip at the cavoatrial junction. Spot chest radiograph shows good positioning and no pneumothorax. Catheter was flushed and sutured externally with 0-Prolene sutures. Patient tolerated the procedure well. FLUOROSCOPY TIME:  1 min; 84 uGym2 DAP COMPLICATIONS: COMPLICATIONS none IMPRESSION: 1. Technically successful right IJ Mahurkar catheter placement. Electronically Signed   By: Lucrezia Europe M.D.   On: 02/01/2020 10:21   DG Chest Port 1 View  Result Date: 02/14/2020 CLINICAL DATA:  Vomiting. Fever. EXAM: PORTABLE CHEST 1 VIEW COMPARISON:  04/24/2019 FINDINGS: The heart is within normal limits given the AP projection and portable technique. Mild tortuosity of the thoracic aorta. Vascular stents are noted. Patchy bibasilar lung opacities suspicious for infiltrates. No pleural effusions or pulmonary edema. The bony thorax is grossly intact. IMPRESSION: Bibasilar infiltrates. Electronically Signed   By: Marijo Sanes M.D.   On: 02/14/2020 05:10   DG Foot 2 Views Left  Result Date: 02/14/2020 CLINICAL DATA:  Foot pain and swelling. EXAM: LEFT FOOT - 2 VIEW COMPARISON:  01/27/2020  radiographs and 01/31/2020 MRI FINDINGS: Prior amputations of the mid first metatarsal and most of the fifth metatarsal. No change since prior study. No obvious destructive bony changes to suggest osteomyelitis. 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 IMPRESSION: 1. 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 2. No definite plain film findings for osteomyelitis. 3. 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 Electronically Signed   By: Marijo Sanes M.D.   On: 02/14/2020 05:08   DG Foot Complete Left  Result Date: 01/27/2020 CLINICAL DATA:  Infection. EXAM: LEFT FOOT - COMPLETE 3+ VIEW COMPARISON:  April 24, 2019 FINDINGS: The patient is status post prior amputation of the fifth digit. The patient is status post prior partial amputation of the first digit through the first metatarsal. There are age-indeterminate erosions through the remaining portions of the first metatarsal. There are advanced degenerative changes at the second metatarsophalangeal joint. Vascular calcifications are noted. There is soft tissue swelling about the foot. There is an ulcer at the lateral aspect of the foot at the level of the base of the remaining fifth metatarsal. There are no obvious erosions. There are degenerative changes of the midfoot. There are progressive erosions of the sesamoid bones of the first digit. IMPRESSION: 1. No acute displaced fracture or dislocation. 2. Progressive erosions involving the first metatarsal and sesamoid bones concerning for osteomyelitis. 3. Status post prior amputation of the fifth digit. 4. Ulcer involving the lateral aspect of the foot. 5. Nonspecific soft tissue swelling is noted. Electronically Signed   By: Constance Holster M.D.   On: 01/27/2020 21:50

## 2020-02-18 DIAGNOSIS — K3184 Gastroparesis: Secondary | ICD-10-CM

## 2020-02-18 DIAGNOSIS — E1143 Type 2 diabetes mellitus with diabetic autonomic (poly)neuropathy: Secondary | ICD-10-CM

## 2020-02-18 LAB — GLUCOSE, CAPILLARY
Glucose-Capillary: 143 mg/dL — ABNORMAL HIGH (ref 70–99)
Glucose-Capillary: 184 mg/dL — ABNORMAL HIGH (ref 70–99)
Glucose-Capillary: 98 mg/dL (ref 70–99)

## 2020-02-18 MED ORDER — INSULIN GLARGINE 100 UNIT/ML ~~LOC~~ SOLN: 2.0000 [IU] | Freq: Every day | SUBCUTANEOUS | Status: DC

## 2020-02-18 MED ORDER — ALBUTEROL SULFATE HFA 108 (90 BASE) MCG/ACT IN AERS: 2.0000 | INHALATION_SPRAY | Freq: Four times a day (QID) | RESPIRATORY_TRACT | 0 refills | Status: DC | PRN

## 2020-02-18 NOTE — Discharge Summary (Signed)
[name] QAS:[id number] DOB: 1981-02-27 DOA: 02/13/2020  PCP: Kerin Perna, NP  Admit date: 02/13/2020  Discharge date: 02/18/2020  Admitted From: Home   Disposition:  Home   Recommendations for Outpatient Follow-up:   Follow up with PCP in 1-2 weeks  PCP Please obtain BMP/CBC, 2 view CXR in 1week,  (see Discharge instructions)   PCP Please follow up on the following pending results: Please monitor left foot wound, make sure he follows with podiatrist on a close basis.   Home Health: patient refused per CM   Equipment/Devices: None  Consultations: Podiatry, renal Discharge Condition: Stable    CODE STATUS: Full    Diet Recommendation: Renal-low carbohydrate diet, 1.2 L fluid restriction per day   Chief Complaint  Patient presents with   COVID POSITIVE   Emesis     Brief history of present illness from the day of admission and additional interim summary     Patient is a 39 y.o. male with PMHx of with history of ESRD on HD MWF, type I DM, gastroparesis-who presented with cough, nausea and vomiting-found to have COVID-19 pneumonia.  See below for further details.  Significant Events: 5/18>> admit to [facility] for nausea/vomiting-found to have COVID-19 pneumonia  COVID-19 medications: Steroids: 5/19 x 1 Remdesivir: 5/19>>  Antibiotics: Rocephin: 5/19>>  Microbiology data: 5/19: Blood culture>> negative  Significant studies: 5/19: Chest x-ray>> bibasilar infiltrates 5/19: Left foot x-ray 2 views>> no osteomyelitis 5/5: MRI left foot>> no fluid collection or abscess, no definite signs of osteomyelitis.                                                                 Hospital Course     Covid 19 Viral pneumonia +/-gastroenteritis: Continues to cough-on room air-GI symptoms have  resolved-CRP downtrending.  He was treated with remdesivir only and is now back to baseline on room air, he has chronic morning sickness due to gastroparesis and besides that he is symptom-free, will be discharged home with PCP follow-up.    SpO2: 100 %  Recent Labs  Lab 02/14/20 0322 02/14/20 1502 02/15/20 0717  CRP  --  10.1* 7.2*  DDIMER  --  1.56*  --   FERRITIN  --  2,863* 2,493*  PROCALCITON  --  13.16  --   SARSCOV2NAA POSITIVE*  --   --     Hepatic Function Latest Ref Rng & Units 02/16/2020 02/15/2020 02/13/2020  Total Protein 6.5 - 8.1 g/dL 6.0(L) 6.4(L) 6.4(L)  Albumin 3.5 - 5.0 g/dL 2.5(L) 2.5(L) 2.8(L)  AST 15 - 41 U/L '27 29 27  ' ALT 0 - 44 U/L '15 16 15  ' Alk Phosphatase 38 - 126 U/L 83 95 87  Total Bilirubin 0.3 - 1.2 mg/dL 0.8 1.7(H) 1.0    Nausea/vomiting:  Now at baseline  has morning sickness for several years and attributes that to his history of gastroparesis, besides that no new or other symptoms.    ESRD: On HD MWF-nephrology following and directing care  History of diabetic foot ulceration: Recent MRI negative for osteomyelitis-x-ray done on admission did not show osteo as well.  Continue wound care.  Do not see any discharge/active signs of infection-but being covered with IV Rocephin.  Blood cultures negative so far.    Podiatry okay to discharge home without any antibiotics with outpatient follow-up, patient instructed to wear surgical boot whenever bearing weight, he refused home health when case manager called him.  HTN - placed on Norvasc  DM-1 with hypoglycemic episodes (A1c 8.9 on 01/31/2020): Extremely brittle diabetic with frequent drops in blood sugars, discharged on home regimen with PCP follow-up.   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  Keep your infected foot clean and dry at all times.  Get CBC, CMP, 2 view Chest X ray -  checked next visit within 1 week by Primary MD   Activity: Left foot where the surgical boot whenever you are bearing weight with Full fall precautions use walker/cane & assistance as needed  Disposition Home   Diet:  Renal - Low carb diet, 1.2 lit fluid restriction per day  Special Instructions: If you have smoked or chewed Tobacco  in the last 2 yrs please stop smoking, stop any regular Alcohol  and or any Recreational drug use.  On your next visit with your primary care physician please Get Medicines reviewed and adjusted.  Please request your Prim.MD to go over all Hospital Tests and Procedure/Radiological results at the follow up, please get all Hospital records sent to your Prim MD by signing hospital release before you go home.  If you experience worsening of your admission symptoms, develop shortness of breath, life threatening emergency, suicidal or homicidal thoughts you must seek medical attention immediately by calling 911 or calling your MD immediately  if symptoms less severe.  You Must read complete instructions/literature along with all the possible adverse reactions/side effects for all the Medicines you take and that have been prescribed to you. Take any new Medicines after you have completely understood and accpet all the possible adverse reactions/side effects.   Increase activity slowly   Complete by: As directed    MyChart COVID-19 home monitoring program   Complete by: Feb 18, 2020    Is the patient willing to use the [employer] for  home monitoring?: Yes   Temperature monitoring   Complete by: Feb 18, 2020    After how many days would you like to receive a notification of this patient's flowsheet entries?: 1      Discharge Medications   Allergies as of 02/18/2020   No Known Allergies     Medication List    STOP taking these medications  ciprofloxacin 500 MG tablet Commonly known as: CIPRO     TAKE these medications   albuterol 108 (90 Base) MCG/ACT inhaler Commonly known as: VENTOLIN HFA Inhale 2 puffs into the lungs every 6 (six) hours as needed for wheezing or shortness of breath.   calcitRIOL 0.5 MCG capsule Commonly known as: ROCALTROL Take 5 capsules (2.5 mcg total) by mouth every Monday, Wednesday, and Friday with hemodialysis.   Lantus SoloStar 100 UNIT/ML Solostar Pen Generic drug: insulin glargine Inject 12 Units into the skin daily.   NovoLOG 100 UNIT/ML injection Generic drug: insulin aspart INJECT 6 TO 7 UNITS SUBCUTANEOUSLY TWICE DAILY WITH A MEAL What changed: See the new instructions.   sevelamer carbonate 800 MG tablet Commonly known as: RENVELA Take 2,400-4,000 mg by mouth See admin instructions. Take 5 tablets with meals then take 3 tablets with snacks       Follow-up Information    Kerin Perna, NP. Schedule an appointment as soon as possible for a visit in 1 week(s).   Specialty: Internal Medicine Contact information: [place] [address] [phone]        Evelina Bucy, DPM. Schedule an appointment as soon as possible for a visit in 3 day(s).   Specialty: Podiatry Contact information: [address] [place] [address] [phone]           Major procedures and Radiology Reports - PLEASE review detailed and final reports thoroughly  -       MR FOOT LEFT WO CONTRAST  Result Date: 01/31/2020 CLINICAL DATA:  Plantar lateral hindfoot ulcer near the ankle EXAM: MRI OF THE LEFT FOOT WITHOUT CONTRAST TECHNIQUE: Multiplanar,  multisequence MR imaging of the left hindfoot/ankle was performed. No intravenous contrast was administered. Axial and coronal T2 fat saturated sequences and sagittal STIR sequences were performed. Evaluation was extremely limited by patient cooperation throughout the exam. Patient requested early termination of the exam. COMPARISON:  01/27/2020 FINDINGS: Bones/Joint/Cartilage Evaluation is extremely limited by patient motion as well as limited nature of the acquired sequences. Mild marrow edema is seen within the cuboid, middle and lateral cuneiform, and second through fourth metatarsals. Ligaments Not adequately evaluated given limitations of the exam. Muscles and Tendons Not adequately evaluated given limitations of the exam. Soft tissues There is subcutaneous edema involving the plantar lateral aspect of the midfoot, at the site of soft tissue ulceration. There is no underlying fluid collection or evidence of abscess. IMPRESSION: 1. Soft tissue edema corresponding to the soft tissue ulceration plantar lateral midfoot. No fluid collection or abscess. 2. Mild edema within the tarsals and metatarsals as described above, likely degenerative and reactive. No definite signs of osteomyelitis based on limited evaluation as above. Electronically Signed   By: Randa Ngo M.D.   On: 01/31/2020 18:16   IR Fluoro Guide CV Line Right  Result Date: 01/31/2020 CLINICAL DATA:  End-stage renal disease on hemodialysis. Occluded shunt with elevated potassium. Temporary hemodialysis access required. EXAM: EXAM RIGHT IJ CATHETER PLACEMENT UNDER ULTRASOUND AND FLUOROSCOPIC GUIDANCE TECHNIQUE: The procedure, risks (including but not limited to bleeding, infection, organ damage, pneumothorax), benefits, and alternatives were explained to the patient. Questions regarding the procedure were encouraged and answered. The patient understands and consents to the procedure. Patency of the right IJ vein was confirmed with ultrasound with  image documentation. An appropriate skin site was determined. Skin site was marked. Region was prepped using maximum barrier technique including cap and mask, sterile gown, sterile gloves, large sterile sheet,  and Chlorhexidine as cutaneous antisepsis. The region was infiltrated locally with 1% lidocaine. Under real-time ultrasound guidance, the right IJ vein was accessed with a 21 gauge needle; the needle tip within the vein was confirmed with ultrasound image documentation. The needle exchanged over a 018 guidewire for vascular dilator. The guidewire would not advance into the SVC. 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 With the aid of an angled Glidewire, access across the SVC and right atrium into the IVC was achieved. Vascular dilators were passed which allowed advancement of a 15 cm Mahurkar catheter. This was positioned with the tip at the cavoatrial junction. Spot chest radiograph shows good positioning and no pneumothorax. Catheter was flushed and sutured externally with 0-Prolene sutures. Patient tolerated the procedure well. FLUOROSCOPY TIME:  1 minutes; 84 uGym2 DAP COMPLICATIONS: COMPLICATIONS none IMPRESSION: 1. Technically successful right IJ Mahurkar catheter placement. Electronically Signed   By: Lucrezia Europe M.D.   On: 01/31/2020 17:54   IR US Guide Vasc Access Right  Result Date: 02/01/2020 CLINICAL DATA:  Renal failure. Occluded hemodialysis graft with markedly elevated potassium, requiring alternative access for hemodialysis EXAM: EXAM RIGHT IJ CATHETER PLACEMENT UNDER ULTRASOUND AND FLUOROSCOPIC GUIDANCE TECHNIQUE: The procedure, risks (including but not limited to bleeding, infection, organ damage, pneumothorax), benefits, and alternatives were explained to the patient. Questions regarding the procedure were encouraged and answered. The patient understands and consents to the procedure. Patency of the right IJ vein was confirmed with  ultrasound with image documentation. An appropriate skin site was determined. Skin site was marked. Region was prepped using maximum barrier technique including cap and mask, sterile gown, sterile gloves, large sterile sheet, and Chlorhexidine as cutaneous antisepsis. The region was infiltrated locally with 1% lidocaine. Under real-time ultrasound guidance, the right IJ vein was accessed with a 21 gauge needle; the needle tip within the vein was confirmed with ultrasound image documentation. The needle exchanged over a 018 guidewire for vascular dilator. The guidewire advanced preferentially to the left innominate vein and would not easily be directed down the SVC. To exclude occlusive process, limited venography was performed demonstrating stenosis at the confluence of the brachiocephalic veins and SVC, with patency of the distal SVC into the right atrium. Subsequently, an angled stiff glidewire was negotiated across the stenotic region into the IVC. Over the this, the tract was dilated which allowed advancement of a 15 cm Mahurkar catheter. This was positioned with the tip at the cavoatrial junction. Spot chest radiograph shows good positioning and no pneumothorax. Catheter was flushed and sutured externally with 0-Prolene sutures. Patient tolerated the procedure well. FLUOROSCOPY TIME:  1 min; 84 uGym2 DAP COMPLICATIONS: COMPLICATIONS none IMPRESSION: 1. Technically successful right IJ Mahurkar catheter placement. Electronically Signed   By: Lucrezia Europe M.D.   On: 02/01/2020 10:21   DG Chest Port 1 View  Result Date: 02/14/2020 CLINICAL DATA:  Vomiting. Fever. EXAM: PORTABLE CHEST 1 VIEW COMPARISON:  04/24/2019 FINDINGS: The heart is within normal limits given the AP projection and portable technique. Mild tortuosity of the thoracic aorta. Vascular stents are noted. Patchy bibasilar lung opacities suspicious for infiltrates. No pleural effusions or pulmonary edema. The bony thorax is grossly intact.  IMPRESSION: Bibasilar infiltrates. Electronically Signed   By: Marijo Sanes M.D.   On: 02/14/2020 05:10   DG Foot 2 Views Left  Result Date: 02/14/2020 CLINICAL DATA:  Foot pain and swelling. EXAM: LEFT FOOT - 2 VIEW COMPARISON:  01/27/2020 radiographs and 01/31/2020 MRI  FINDINGS: Prior amputations of the mid first metatarsal and most of the fifth metatarsal. No change since prior study. No obvious destructive bony changes to suggest osteomyelitis. 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 IMPRESSION: 1. 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 2. No definite plain film findings for osteomyelitis. 3. 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 Electronically Signed   By: Marijo Sanes M.D.   On: 02/14/2020 05:08   DG Foot Complete Left  Result Date: 01/27/2020 CLINICAL DATA:  Infection. EXAM: LEFT FOOT - COMPLETE 3+ VIEW COMPARISON:  April 24, 2019 FINDINGS: The patient is status post prior amputation of the fifth digit. The patient is status post prior partial amputation of the first digit through the first metatarsal. There are age-indeterminate erosions through the remaining portions of the first metatarsal. There are advanced degenerative changes at the second metatarsophalangeal joint. Vascular calcifications are noted. There is soft tissue swelling about the foot. There is an ulcer at the lateral aspect of the foot at the level of the base of the remaining fifth metatarsal. There are no obvious erosions. There are degenerative changes of the midfoot. There are progressive erosions of the sesamoid bones of the first digit. IMPRESSION: 1. No acute displaced fracture or dislocation. 2. Progressive erosions involving the first metatarsal and sesamoid bones concerning for osteomyelitis. 3. Status post prior amputation of the fifth digit. 4. Ulcer involving the lateral aspect of the foot. 5. Nonspecific  soft tissue swelling is noted. Electronically Signed   By: Constance Holster M.D.   On: 01/27/2020 21:50    Micro Results     Recent Results (from the past 240 hour(s))  SARS Coronavirus 2 by RT PCR (hospital order, performed in [facility] hospital lab) Nasopharyngeal Nasopharyngeal Swab     Status: Abnormal   Collection Time: 02/14/20  3:22 AM   Specimen: Nasopharyngeal Swab  Result Value Ref Range Status   SARS Coronavirus 2 POSITIVE (A) NEGATIVE Final    Comment: RESULT CALLED TO, READ BACK BY AND VERIFIED WITH: B OSORIO RN 02/14/20 0453 JDW (NOTE) SARS-CoV-2 target nucleic acids are DETECTED SARS-CoV-2 RNA is generally detectable in upper respiratory specimens  during the acute phase of infection.  Positive results are indicative  of the presence of the identified virus, but do not rule out bacterial infection or co-infection with other pathogens not detected by the test.  Clinical correlation with patient history and  other diagnostic information is necessary to determine patient infection status.  The expected result is negative. Fact Sheet for Patients:   StrictlyIdeas.no  Fact Sheet for Healthcare Providers:   BankingDealers.co.za   This test is not yet approved or cleared by the [place] FDA and  has been authorized for detection and/or diagnosis of SARS-CoV-2 by FDA under an Emergency Use Authorization (EUA).  This EUA will remain in effect (meaning this test can be used ) for the duration of  the COVID-19 declaration under Section 564(b)(1) of the Act, 21 U.S.C. section 360-bbb-3(b)(1), unless the authorization is terminated or revoked sooner. Performed at [facility], [place] [address], [place], [place] [address]   Blood culture (routine x 2)     Status: None (Preliminary result)   Collection Time: 02/14/20  2:56 PM   Specimen: BLOOD RIGHT HAND  Result Value Ref Range Status   Specimen Description BLOOD RIGHT  HAND  Final   Special Requests   Final  BOTTLES DRAWN AEROBIC AND ANAEROBIC Blood Culture adequate volume   Culture   Final    NO GROWTH 3 DAYS Performed at [facility], [place] [address], [place], [place] [address]    Report Status PENDING  Incomplete  Blood culture (routine x 2)     Status: None (Preliminary result)   Collection Time: 02/14/20  7:00 PM   Specimen: BLOOD  Result Value Ref Range Status   Specimen Description BLOOD BLOOD RIGHT FOREARM  Final   Special Requests   Final    BOTTLES DRAWN AEROBIC ONLY Blood Culture adequate volume   Culture   Final    NO GROWTH 3 DAYS Performed at [facility], [place] [address], [place], [place] [address]    Report Status PENDING  Incomplete    Today   Subjective    [name] today has no headache,no chest abdominal pain,no new weakness tingling or numbness, feels much better wants to go home today.    Objective   Blood pressure 137/85, pulse 88, temperature 98.7 F (37.1 C), temperature source Oral, resp. rate 20, height '5\' 6"'  (1.676 m), weight 67.9 kg, SpO2 100 %.  No intake or output data in the 24 hours ending 02/18/20 0737  Exam  Awake Alert, No new F.N deficits, Normal affect .AT,PERRAL Supple Neck,No JVD, No cervical lymphadenopathy appriciated.  Symmetrical Chest wall movement, Good air movement bilaterally, CTAB RRR,No Gallops,Rubs or new Murmurs, No Parasternal Heave +ve B.Sounds, Abd Soft, Non tender, No organomegaly appriciated, No rebound -guarding or rigidity. No Cyanosis, left foot under bandage   Data Review   CBC w Diff:  Lab Results  Component Value Date   WBC 5.2 02/16/2020   HGB 12.2 (L) 02/16/2020   HGB 9.7 (L) 08/22/2014   HCT 38.2 (L) 02/16/2020   HCT 30.0 (L) 08/22/2014   PLT 159 02/16/2020   PLT 158 08/22/2014   LYMPHOPCT 20 02/16/2020   LYMPHOPCT 23.4 08/22/2014   BANDSPCT 0 10/25/2017   MONOPCT 15 02/16/2020   MONOPCT 11.4 08/22/2014   EOSPCT 1 02/16/2020   EOSPCT 5.4  08/22/2014   BASOPCT 0 02/16/2020   BASOPCT 1.4 08/22/2014    CMP:  Lab Results  Component Value Date   NA 131 (L) 02/16/2020   NA 138 08/22/2014   K 4.1 02/16/2020   K 4.0 08/22/2014   CL 93 (L) 02/16/2020   CL 100 08/22/2014   CO2 24 02/16/2020   CO2 30 08/22/2014   BUN 28 (H) 02/16/2020   BUN 14 08/22/2014   CREATININE 8.81 (H) 02/16/2020   CREATININE 5.00 (H) 08/22/2014   PROT 6.0 (L) 02/16/2020   PROT 7.2 08/19/2014   ALBUMIN 2.5 (L) 02/16/2020   ALBUMIN 3.8 08/19/2014   BILITOT 0.8 02/16/2020   BILITOT 0.9 08/19/2014   ALKPHOS 83 02/16/2020   ALKPHOS 121 (H) 08/19/2014   AST 27 02/16/2020   AST 14 (L) 08/19/2014   ALT 15 02/16/2020   ALT 20 08/19/2014  .   Total Time in preparing paper work, data evaluation and todays exam - 52 minutes  Lala Lund M.D on 02/18/2020 at 7:37 AM  [facility]   Office  [phone]

## 2020-02-18 NOTE — Discharge Instructions (Signed)
Follow with Primary MD Kerin Perna, NP in 7 days, also follow with your podiatrist within a week.  Keep your infected foot clean and dry at all times.   Get CBC, CMP, 2 view Chest X ray -  checked next visit within 1 week by Primary MD   Activity: Left foot where the surgical boot whenever you are bearing weight with Full fall precautions use walker/cane & assistance as needed  Disposition Home   Diet:  Renal - Low carb diet, 1.2 lit fluid restriction per day  Special Instructions: If you have smoked or chewed Tobacco  in the last 2 yrs please stop smoking, stop any regular Alcohol  and or any Recreational drug use.  On your next visit with your primary care physician please Get Medicines reviewed and adjusted.  Please request your Prim.MD to go over all Hospital Tests and Procedure/Radiological results at the follow up, please get all Hospital records sent to your Prim MD by signing hospital release before you go home.  If you experience worsening of your admission symptoms, develop shortness of breath, life threatening emergency, suicidal or homicidal thoughts you must seek medical attention immediately by calling 911 or calling your MD immediately  if symptoms less severe.  You Must read complete instructions/literature along with all the possible adverse reactions/side effects for all the Medicines you take and that have been prescribed to you. Take any new Medicines after you have completely understood and accpet all the possible adverse reactions/side effects.       Person Under Monitoring Name: [name]  Location: [place] [address]   Infection Prevention Recommendations for Individuals Confirmed to have, or Being Evaluated for, 2019 Novel Coronavirus (COVID-19) Infection Who Receive Care at Home  Individuals who are confirmed to have, or are being evaluated for, COVID-19 should follow the prevention steps below until a healthcare provider  or local or state health department says they can return to normal activities.  Stay home except to get medical care You should restrict activities outside your home, except for getting medical care. Do not go to work, school, or public areas, and do not use public transportation or taxis.  Call ahead before visiting your doctor Before your medical appointment, call the healthcare provider and tell them that you have, or are being evaluated for, COVID-19 infection. This will help the healthcare provider's office take steps to keep other people from getting infected. Ask your healthcare provider to call the local or state health department.  Monitor your symptoms Seek prompt medical attention if your illness is worsening (e.g., difficulty breathing). Before going to your medical appointment, call the healthcare provider and tell them that you have, or are being evaluated for, COVID-19 infection. Ask your healthcare provider to call the local or state health department.  Wear a facemask You should wear a facemask that covers your nose and mouth when you are in the same room with other people and when you visit a healthcare provider. People who live with or visit you should also wear a facemask while they are in the same room with you.  Separate yourself from other people in your home As much as possible, you should stay in a different room from other people in your home. Also, you should use a separate bathroom, if available.  Avoid sharing household items You should not share dishes, drinking glasses, cups, eating utensils, towels, bedding, or other items with other people in your  home. After using these items, you should wash them thoroughly with soap and water.  Cover your coughs and sneezes Cover your mouth and nose with a tissue when you cough or sneeze, or you can cough or sneeze into your sleeve. Throw used tissues in a lined trash can, and immediately wash your hands with soap  and water for at least 20 seconds or use an alcohol-based hand rub.  Wash your [facility] your hands often and thoroughly with soap and water for at least 20 seconds. You can use an alcohol-based hand sanitizer if soap and water are not available and if your hands are not visibly dirty. Avoid touching your eyes, nose, and mouth with unwashed hands.   Prevention Steps for Caregivers and Household Members of Individuals Confirmed to have, or Being Evaluated for, COVID-19 Infection Being Cared for in the Home  If you live with, or provide care at home for, a person confirmed to have, or being evaluated for, COVID-19 infection please follow these guidelines to prevent infection:  Follow healthcare provider's instructions Make sure that you understand and can help the patient follow any healthcare provider instructions for all care.  Provide for the patient's basic needs You should help the patient with basic needs in the home and provide support for getting groceries, prescriptions, and other personal needs.  Monitor the patient's symptoms If they are getting sicker, call his or her medical provider and tell them that the patient has, or is being evaluated for, COVID-19 infection. This will help the healthcare provider's office take steps to keep other people from getting infected. Ask the healthcare provider to call the local or state health department.  Limit the number of people who have contact with the patient  If possible, have only one caregiver for the patient.  Other household members should stay in another home or place of residence. If this is not possible, they should stay  in another room, or be separated from the patient as much as possible. Use a separate bathroom, if available.  Restrict visitors who do not have an essential need to be in the home.  Keep older adults, very young children, and other sick people away from the patient Keep older adults, very young  children, and those who have compromised immune systems or chronic health conditions away from the patient. This includes people with chronic heart, lung, or kidney conditions, diabetes, and cancer.  Ensure good ventilation Make sure that shared spaces in the home have good air flow, such as from an air conditioner or an opened window, weather permitting.  Wash your hands often  Wash your hands often and thoroughly with soap and water for at least 20 seconds. You can use an alcohol based hand sanitizer if soap and water are not available and if your hands are not visibly dirty.  Avoid touching your eyes, nose, and mouth with unwashed hands.  Use disposable paper towels to dry your hands. If not available, use dedicated cloth towels and replace them when they become wet.  Wear a facemask and gloves  Wear a disposable facemask at all times in the room and gloves when you touch or have contact with the patient's blood, body fluids, and/or secretions or excretions, such as sweat, saliva, sputum, nasal mucus, vomit, urine, or feces.  Ensure the mask fits over your nose and mouth tightly, and do not touch it during use.  Throw out disposable facemasks and gloves after using them. Do not reuse.  Wash  your hands immediately after removing your facemask and gloves.  If your personal clothing becomes contaminated, carefully remove clothing and launder. Wash your hands after handling contaminated clothing.  Place all used disposable facemasks, gloves, and other waste in a lined container before disposing them with other household waste.  Remove gloves and wash your hands immediately after handling these items.  Do not share dishes, glasses, or other household items with the patient  Avoid sharing household items. You should not share dishes, drinking glasses, cups, eating utensils, towels, bedding, or other items with a patient who is confirmed to have, or being evaluated for, COVID-19  infection.  After the person uses these items, you should wash them thoroughly with soap and water.  Wash laundry thoroughly  Immediately remove and wash clothes or bedding that have blood, body fluids, and/or secretions or excretions, such as sweat, saliva, sputum, nasal mucus, vomit, urine, or feces, on them.  Wear gloves when handling laundry from the patient.  Read and follow directions on labels of laundry or clothing items and detergent. In general, wash and dry with the warmest temperatures recommended on the label.  Clean all areas the individual has used often  Clean all touchable surfaces, such as counters, tabletops, doorknobs, bathroom fixtures, toilets, phones, keyboards, tablets, and bedside tables, every day. Also, clean any surfaces that may have blood, body fluids, and/or secretions or excretions on them.  Wear gloves when cleaning surfaces the patient has come in contact with.  Use a diluted bleach solution (e.g., dilute bleach with 1 part bleach and 10 parts water) or a household disinfectant with a label that says EPA-registered for coronaviruses. To make a bleach solution at home, add 1 tablespoon of bleach to 1 quart (4 cups) of water. For a larger supply, add  cup of bleach to 1 gallon (16 cups) of water.  Read labels of cleaning products and follow recommendations provided on product labels. Labels contain instructions for safe and effective use of the cleaning product including precautions you should take when applying the product, such as wearing gloves or eye protection and making sure you have good ventilation during use of the product.  Remove gloves and wash hands immediately after cleaning.  Monitor yourself for signs and symptoms of illness Caregivers and household members are considered close contacts, should monitor their health, and will be asked to limit movement outside of the home to the extent possible. Follow the monitoring steps for close contacts  listed on the symptom monitoring form.   ? If you have additional questions, contact your local health department or call the epidemiologist on call at [phone] (available 24/7). ? This guidance is subject to change. For the most up-to-date guidance from [facility], please refer to their website: [email or web address]

## 2020-02-18 NOTE — Progress Notes (Addendum)
Patient called nursing station screaming and yelling about the location of his lunch tray. Tray delivered approximately 2 minutes later. However, patient continues to be verbally abusive and threatening to staff. Discussed with patient about our lack of tolerance with this type of behavior. Patient has discharge orders, is his own ride home and has no medical reason to prevent him from leaving the hospital. Patient's PIV needs to be removed however patient refuses staff members to get close to him. Due to inappropriateness, patient kindly asked to allow staff to remove PIV so patient can safely be discharged. Security called to help assist PIV removal and help escort patient out.

## 2020-02-18 NOTE — Progress Notes (Signed)
[name] to be D/C'd Home per MD order.  Discussed with the patient and all questions fully answered.  VSS, Skin clean, dry and intact without evidence of skin break down, no evidence of skin tears noted. IV catheter discontinued intact. Site without signs and symptoms of complications. Dressing and pressure applied.  An After Visit Summary was printed and given to the patient. Patient received prescription.  D/c education completed with patient/family including follow up instructions, medication list, d/c activities limitations if indicated, with other d/c instructions as indicated by MD - patient able to verbalize understanding, all questions fully answered.   Patient instructed to return to ED, call 911, or call MD for any changes in condition.   Patient escorted by security via WC, and D/C home via private auto.         Lorenza Evangelist Montejano 02/18/2020 1:27 PM

## 2020-02-19 ENCOUNTER — Telehealth: Payer: Self-pay

## 2020-02-19 LAB — CULTURE, BLOOD (ROUTINE X 2)
Culture: NO GROWTH
Culture: NO GROWTH
Special Requests: ADEQUATE
Special Requests: ADEQUATE

## 2020-02-19 NOTE — Telephone Encounter (Signed)
Transition Care Management Follow-up Telephone Call Date of discharge and from where: 02/18/2020, [facility]  Call placed to patient # [phone], unable to leave a message, voicemail is full. Patient has instructions on AVS  to call the clinic to schedule an appointment.   He needs to schedule follow up with Juluis Mire, NP at [facility]

## 2020-02-20 ENCOUNTER — Ambulatory Visit: Payer: Medicare Other | Admitting: Sports Medicine

## 2020-02-20 ENCOUNTER — Telehealth: Payer: Self-pay

## 2020-02-20 DIAGNOSIS — U071 COVID-19: Secondary | ICD-10-CM | POA: Insufficient documentation

## 2020-02-20 DIAGNOSIS — Z8616 Personal history of COVID-19: Secondary | ICD-10-CM | POA: Insufficient documentation

## 2020-02-20 NOTE — Telephone Encounter (Signed)
Transition Care Management Follow-up Telephone Call Date of discharge and from where: 02/18/2020, [facility].  Call placed to patient.  This CM explained the reason for the call  - to check on him since he has been home from the hospital. I was calling on behalf of Ms Oletta Lamas, NP to see how he is doing and schedule follow up. He asked by I was calling on Ms Oletta Lamas' behalf and then hung up.  Unable to schedule follow up appointment.  He has the clinic number on his AVS to call and schedule follow up.

## 2020-02-22 ENCOUNTER — Ambulatory Visit: Payer: Medicare Other | Admitting: Podiatry

## 2020-02-27 ENCOUNTER — Ambulatory Visit: Payer: Medicare Other | Admitting: Sports Medicine

## 2020-03-01 ENCOUNTER — Ambulatory Visit (INDEPENDENT_AMBULATORY_CARE_PROVIDER_SITE_OTHER): Payer: Medicare Other | Admitting: Podiatry

## 2020-03-01 ENCOUNTER — Other Ambulatory Visit: Payer: Self-pay

## 2020-03-01 DIAGNOSIS — L928 Other granulomatous disorders of the skin and subcutaneous tissue: Secondary | ICD-10-CM

## 2020-03-01 DIAGNOSIS — L97521 Non-pressure chronic ulcer of other part of left foot limited to breakdown of skin: Secondary | ICD-10-CM

## 2020-03-01 NOTE — Progress Notes (Signed)
  Subjective:  Patient ID: [name], male    DOB: 07/01/1981,  MRN: [id number]  No chief complaint on file.   38 y.o. male presents for wound care. Patient agitated today about having to wait despite not having an appointment and being told he would be worked in. Denies new issues. Objective:  Physical Exam: Wound Location: left submet 5 Wound Measurement: 3x4 Wound Base: Granular/Healthy Peri-wound: Calloused Exudate: Moderate amount Serosanguinous exudate wound without warmth, erythema, signs of acute infection Assessment:   1. Ulcer of left foot, limited to breakdown of skin ([place])   2. 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  Ulcer left midfoot -Wound improving -Cauterized with silver nitrate to promote epithelialization -Continue CAM boot immoilization -Leave dressing intact 1 week  Procedure: Chemical Cauterization of Granulation Tissue Rationale: Cauterize granular wound base to promote healing.  Wound Measurements: 3 cm x 4 cm x 0.1 cm  Instrumentation: Silver nitrate stick x4 Dressing: Dry, sterile, compression dressing. Disposition: Patient tolerated procedure well. Patient to return in 1 week for follow-up.   No follow-ups on file.

## 2020-03-08 ENCOUNTER — Ambulatory Visit: Payer: Medicare Other | Admitting: Podiatry

## 2020-03-13 ENCOUNTER — Ambulatory Visit: Payer: Medicare Other | Admitting: Podiatry

## 2020-03-14 ENCOUNTER — Ambulatory Visit (INDEPENDENT_AMBULATORY_CARE_PROVIDER_SITE_OTHER): Payer: Medicare Other | Admitting: Podiatry

## 2020-03-14 ENCOUNTER — Other Ambulatory Visit: Payer: Self-pay

## 2020-03-14 DIAGNOSIS — L97521 Non-pressure chronic ulcer of other part of left foot limited to breakdown of skin: Secondary | ICD-10-CM | POA: Diagnosis not present

## 2020-03-14 DIAGNOSIS — E10621 Type 1 diabetes mellitus with foot ulcer: Secondary | ICD-10-CM

## 2020-03-14 NOTE — Progress Notes (Signed)
  Subjective:  Patient ID: [name], male    DOB: 1980-10-22,  MRN: [id number]  No chief complaint on file.  39 y.o. male presents for wound care. Patient better today. Denies pain. Not wearing boot today states he got here in a rush and didn't wear it but states he has been wearing it.  Objective:  Physical Exam: Wound Location: left submet 5 Wound Measurement: 3x4 Wound Base: Granular/Healthy Peri-wound: Calloused Exudate: Moderate amount Serosanguinous exudate wound without warmth, erythema, signs of acute infection Assessment:   1. Ulcer of left foot, limited to breakdown of skin [facility])    Plan:  Patient was evaluated and treated and all questions answered.  Ulcer left midfoot -Wound stalled, same as previous. Debrided, skin graft substitute applied.  -Artacent 4x4 graft applied, followed by adaptic, 4x4 , kerlix, ACE -F/u in 1 week for recheck.  No follow-ups on file.

## 2020-03-21 ENCOUNTER — Other Ambulatory Visit: Payer: Self-pay

## 2020-03-21 ENCOUNTER — Ambulatory Visit (INDEPENDENT_AMBULATORY_CARE_PROVIDER_SITE_OTHER): Payer: Medicare Other | Admitting: Podiatry

## 2020-03-21 ENCOUNTER — Encounter: Payer: Self-pay | Admitting: Podiatry

## 2020-03-21 DIAGNOSIS — Z899 Acquired absence of limb, unspecified: Secondary | ICD-10-CM

## 2020-03-21 DIAGNOSIS — L97522 Non-pressure chronic ulcer of other part of left foot with fat layer exposed: Secondary | ICD-10-CM | POA: Diagnosis not present

## 2020-03-21 DIAGNOSIS — R52 Pain, unspecified: Secondary | ICD-10-CM

## 2020-03-21 DIAGNOSIS — E1142 Type 2 diabetes mellitus with diabetic polyneuropathy: Secondary | ICD-10-CM

## 2020-03-21 NOTE — Progress Notes (Signed)
Subjective:  Patient ID: [name], male    DOB: [birth year],  MRN: [id number]  Chief Complaint  Patient presents with   Foot Ulcer    the left foot has not got any wrap or graft on it     39 y.o. male past medical history of diabetes mellitus and ESRD on hemodialysis presents for wound care of chronic left plantar midfoot wound, he is a patient of Dr. March Rummage.  He saw him here in the office last week, where the wound was debrided and a graft was applied.  He presents ambulating in Cam boot with no dressing today and has not been changing his supplies.  He says he has had issues with getting home nurse come visit, as well as getting wound care supplies.   Review of Systems: Negative except as noted in the HPI. Denies N/V/F/Ch.  Past Medical History:  Diagnosis Date   Diabetes mellitus without complication ([place])    Diabetic gastroparesis ([place])    Dialysis patient [facility])    Hypertension    Renal disorder    Dialysis   Sepsis ([place])     Current Outpatient Medications:    albuterol (VENTOLIN HFA) 108 (90 Base) MCG/ACT inhaler, Inhale 2 puffs into the lungs every 6 (six) hours as needed for wheezing or shortness of breath., Disp: 6.7 g, Rfl: 0   calcitRIOL (ROCALTROL) 0.5 MCG capsule, Take 5 capsules (2.5 mcg total) by mouth every Monday, Wednesday, and Friday with hemodialysis., Disp: 30 capsule, Rfl: 0   insulin glargine (LANTUS SOLOSTAR) 100 UNIT/ML Solostar Pen, Inject 12 Units into the skin daily., Disp: 5 pen, Rfl: PRN   NOVOLOG 100 UNIT/ML injection, INJECT 6 TO 7 UNITS SUBCUTANEOUSLY TWICE DAILY WITH A MEAL (Patient taking differently: Inject 7 Units into the skin 3 (three) times daily with meals. ), Disp: 10 mL, Rfl: 0   sevelamer carbonate (RENVELA) 800 MG tablet, Take 2,400-4,000 mg by mouth See admin instructions. Take 5 tablets with meals then take 3 tablets with snacks, Disp: , Rfl:   Social History   Tobacco Use  Smoking Status Never Smoker  Smokeless  Tobacco Never Used    No Known Allergies Objective:  There were no vitals filed for this visit. There is no height or weight on file to calculate BMI. Constitutional Well developed. Well nourished.  Vascular Dorsalis pedis pulses weakly palpable bilaterally. Posterior tibial pulses weakly palpable bilaterally.   Neurologic Normal speech. Oriented to person, place, and time. Protective sensation absent  Dermatologic Wound Location: Lt. foot plantar midfoot Wound Base: Granular/Healthy Peri-wound: Calloused Exudate: Scant/small amount Serous exudate Wound Measurements: -3 cm x 4 cm by 0.2 cm  Orthopedic: No pain to palpation either foot.   Radiographs: Not applicable Assessment:   1. Pain   2. Diabetic polyneuropathy associated with type 2 diabetes mellitus ([place])   3. History of amputation   4. Ulcer of left foot, with fat layer exposed ([place])    Plan:  Patient was evaluated and treated and all questions answered.  Ulcer  -Debridement as below. -Dressed with Betadine, DSD. -Continue off-loading with CAM boot -Follow-up with Dr. March Rummage in 1 week  Procedure: Excisional Debridement of Wound Rationale: Removal of non-viable soft tissue from the wound to promote healing.  Anesthesia: none required Pre-Debridement Wound Measurements: 3 cm x 4 cm x 0.2 cm  Post-Debridement Wound Measurements: 3 cm x 4 cm x 0.2 cm  Type of Debridement: Sharp Excisional Tissue Removed: Non-viable soft tissue, periwound hyperkeratosis Depth of  Debridement: subcutaneous tissue. Technique: Sharp excisional debridement to bleeding, viable wound base.  Dressing: Dry, sterile, compression dressing with Betadine Disposition: Patient tolerated procedure well. Patient to return in 1 week for follow-up.  Return in about 1 week (around 03/28/2020).

## 2020-03-28 ENCOUNTER — Other Ambulatory Visit: Payer: Self-pay

## 2020-03-28 ENCOUNTER — Ambulatory Visit (INDEPENDENT_AMBULATORY_CARE_PROVIDER_SITE_OTHER): Payer: Medicare Other | Admitting: Podiatry

## 2020-03-28 DIAGNOSIS — E1142 Type 2 diabetes mellitus with diabetic polyneuropathy: Secondary | ICD-10-CM

## 2020-03-28 NOTE — Progress Notes (Signed)
°  Subjective:  Patient ID: [name], male    DOB: April 16, 1981,  MRN: [id number]  Chief Complaint  Patient presents with   Wound Check    Pt has boot on but no dressing on the wound. Pt did not convey any concerns.   39 y.o. male presents for wound care. Patient without dressing today. States he did have any dressing supplies.  Objective:  Physical Exam: Wound Location: left submet 5 Wound Measurement: 3.5x3.5 Wound Base: Granular/Healthy Peri-wound: Calloused Exudate: Moderate amount Serosanguinous exudate wound without warmth, erythema, signs of acute infection Assessment:   1. Diabetic polyneuropathy associated with type 2 diabetes mellitus ([place])    Plan:  Patient was evaluated and treated and all questions answered.  Ulcer left midfoot -Wound slightly improved. -Graft reapplied. 4x4 artacent graft tissue ID [id number]. Dressed with adaptic, 4x4,kerlix,ACE. To be left intact until next week.  No follow-ups on file.

## 2020-03-29 ENCOUNTER — Telehealth: Payer: Self-pay | Admitting: *Deleted

## 2020-03-29 NOTE — Telephone Encounter (Signed)
Dr. March Rummage ordered collagen dressing, antimicrobial roll gauze, tape, saline, gloves for daily dressing changes to the let sub met 5th, with wound measuring 3.5 x 3.5cm. faxed required form, with supplies completed by Dr. March Rummage, and demographics and LOV to [facility].

## 2020-04-02 NOTE — Progress Notes (Signed)
  Subjective:  Patient ID: [name], male    DOB: March 27, 1981,  MRN: [id number]  Chief Complaint  Patient presents with  . Diabetes    diabetic foot care, pt states that he is in a lot of pain, but states that the right foot is still painful to the touch.     39 y.o. male presents for wound care. Hx confirmed with patient.  Objective:  Physical Exam: Wound Location: left lateral foot Wound Measurement: 2.5x4x0.3 Wound Base: Granular/Healthy Peri-wound: Calloused Exudate: Moderate amount Serosanguinous exudate wound without warmth, erythema, signs of acute infection  Assessment:   1. Ulcer of left foot, limited to breakdown of skin [facility])      Plan:  Patient was evaluated and treated and all questions answered.  Ulcer left foot -Offload ulcer with CAM boot -CAM boot dispensed -Wound cleansed and debrided  Procedure: Selective Debridement of Wound Rationale: Removal of devitalized tissue from the wound to promote healing.  Pre-Debridement Wound Measurements: 2.5 cm x 4 cm x 0.3 cm  Post-Debridement Wound Measurements: same as pre-debridement. Type of Debridement: sharp selective Tissue Removed: Devitalized soft-tissue Dressing: Dry, sterile, compression dressing. Disposition: Patient tolerated procedure well. Patient to return in 1 week for follow-up.   No follow-ups on file.

## 2020-04-04 ENCOUNTER — Ambulatory Visit (INDEPENDENT_AMBULATORY_CARE_PROVIDER_SITE_OTHER): Payer: Medicare Other | Admitting: Podiatry

## 2020-04-04 DIAGNOSIS — Z5329 Procedure and treatment not carried out because of patient's decision for other reasons: Secondary | ICD-10-CM

## 2020-04-04 NOTE — Progress Notes (Signed)
No show for appt. 

## 2020-04-08 ENCOUNTER — Ambulatory Visit: Payer: Medicare Other | Admitting: Podiatry

## 2020-04-15 ENCOUNTER — Other Ambulatory Visit (INDEPENDENT_AMBULATORY_CARE_PROVIDER_SITE_OTHER): Payer: Self-pay | Admitting: Primary Care

## 2020-04-15 ENCOUNTER — Telehealth (INDEPENDENT_AMBULATORY_CARE_PROVIDER_SITE_OTHER): Payer: Self-pay | Admitting: Primary Care

## 2020-04-15 DIAGNOSIS — E1059 Type 1 diabetes mellitus with other circulatory complications: Secondary | ICD-10-CM

## 2020-04-15 NOTE — Telephone Encounter (Signed)
Medication Refill - Medication: Accuchek test strips  Has the patient contacted their pharmacy? yes (Agent: If no, request that the patient contact the pharmacy for the refill.) (Agent: If yes, when and what did the pharmacy advise?)contact pcp  Preferred Pharmacy (with phone number or street name):  [place] ([place]), [place] - [address] Phone:  [phone]  Fax:  [phone]       Agent: Please be advised that RX refills may take up to 3 business days. We ask that you follow-up with your pharmacy.

## 2020-04-15 NOTE — Telephone Encounter (Signed)
Copied from [place] [id number]. Topic: Complaint - Care >> Apr 15, 2020  1:06 PM Erick Blinks wrote: Best contact [phone] Pt is irate because he is missing medication for his diabetes. Please advise

## 2020-04-16 ENCOUNTER — Encounter (INDEPENDENT_AMBULATORY_CARE_PROVIDER_SITE_OTHER): Payer: Self-pay | Admitting: Primary Care

## 2020-04-16 MED ORDER — GLUCOSE BLOOD VI STRP: ORAL_STRIP | 12 refills | Status: DC

## 2020-04-16 NOTE — Addendum Note (Signed)
Addended byMariane Baumgarten on: 04/16/2020 01:37 PM   Modules accepted: Orders

## 2020-04-16 NOTE — Telephone Encounter (Signed)
Rx sent 

## 2020-04-16 NOTE — Telephone Encounter (Signed)
LM on practice administrator's VM regarding pt and sent teams message to [facility] Radiation protection practitioner) of pt being irate and threatening to come up to the office in 15 minutes.

## 2020-04-16 NOTE — Telephone Encounter (Signed)
Patient called yelling and using profanities regarding  Not getting his test strips. Pt called yesterday and PEC NT nurse routed to office. Pt very angry. The test strips he needs are per chart review:  Accu chek aviva plus . Pt threatened to come up to office in 15 minutes if something was not done. Pt used many expletives and was screaming. Pt hung up.  Attempted to call [facility] several times ([phone]) but no answer. Sending to office high priority. Please send to [facility] at [place]

## 2020-04-16 NOTE — Progress Notes (Signed)
[name]. Mazon is a 39 year old male that was out of town/state visiting family . Realized he did not have his insulin, I received calls from [facility] on call nurse team health. Unable to get medication from [place] was closed and was asked to transfer prescription to [facility] 24 hr. I called gave verbal orders they were out of vials and would [facility] insurance would not pay since it was too soon for refill. I tried changing direction for insulin to the pharmacy, the pharmacist tried lost, stolen and insurance denied. Patient is upset because he is a type 1 diabetic without insulin and he is not able to pay for it out of pocket. This was ongoing Friday-Sunday. Pharmacist called to inform me that he brought regular insulin $24 because his insurance denied it. She was giving me a FYI. Left personal cell phone with all pharmacist from [facility] or [facility] [place] and [facility] to contact me for further problems.  Patient never request diabetic supplies-test strips.

## 2020-04-16 NOTE — Telephone Encounter (Signed)
This encounter was created in error - please disregard.

## 2020-04-18 ENCOUNTER — Ambulatory Visit (INDEPENDENT_AMBULATORY_CARE_PROVIDER_SITE_OTHER): Payer: Medicare Other | Admitting: Podiatry

## 2020-04-18 ENCOUNTER — Other Ambulatory Visit: Payer: Self-pay

## 2020-04-18 DIAGNOSIS — L97522 Non-pressure chronic ulcer of other part of left foot with fat layer exposed: Secondary | ICD-10-CM | POA: Diagnosis not present

## 2020-04-18 NOTE — Telephone Encounter (Signed)
Sent in Test strip, patient was informed.

## 2020-04-18 NOTE — Progress Notes (Signed)
  Subjective:  Patient ID: [name], male    DOB: 09-07-1981,  MRN: [id number]  Chief Complaint  Patient presents with  . Foot Pain    pt is here for a f/u to the ulcer of the left foot wound change. Pt has been keeping it bandaged   39 y.o. male presents for wound care. Patient is down due to multiple medical issues. Patient spent a majority of the time during the visit on the phone with his phone.  Objective:  Physical Exam: Wound Location: left submet 5 Wound Measurement: 4.3x3.8x1 post-debridement Wound Base: Granular Peri-wound: Calloused Exudate: Moderate amount Serosanguinous exudate wound without warmth, erythema, signs of acute infection Dirt and debris in wound prior to debridement with significant hyperkeratotic rim   Assessment:   1. Ulcer of left foot, with fat layer exposed ([place])    Plan:  Patient was evaluated and treated and all questions answered.  Ulcer left midfoot -Wound worsened. Dirt noted in the wound. -Wound debrided, skin graft substitute applied as below. -Graft reapplied. 4x4 Artacent graft Tissue ID [id number]. Dressed with adaptic, 4x4,kerlix,ACE. To be left intact until next week. -Discussed importance of compliance with visits, dressings, and wearing of boot. Discussed that should the ulceration worsen or become infected he stands risk of losing his foot.  No follow-ups on file.

## 2020-04-23 DIAGNOSIS — L97509 Non-pressure chronic ulcer of other part of unspecified foot with unspecified severity: Secondary | ICD-10-CM | POA: Insufficient documentation

## 2020-04-23 DIAGNOSIS — E11621 Type 2 diabetes mellitus with foot ulcer: Secondary | ICD-10-CM | POA: Insufficient documentation

## 2020-04-25 ENCOUNTER — Telehealth: Payer: Self-pay | Admitting: *Deleted

## 2020-04-25 ENCOUNTER — Ambulatory Visit (INDEPENDENT_AMBULATORY_CARE_PROVIDER_SITE_OTHER): Payer: Medicare Other

## 2020-04-25 ENCOUNTER — Other Ambulatory Visit: Payer: Self-pay

## 2020-04-25 ENCOUNTER — Ambulatory Visit (INDEPENDENT_AMBULATORY_CARE_PROVIDER_SITE_OTHER): Payer: Medicare Other | Admitting: Podiatry

## 2020-04-25 DIAGNOSIS — M8580 Other specified disorders of bone density and structure, unspecified site: Secondary | ICD-10-CM | POA: Diagnosis not present

## 2020-04-25 DIAGNOSIS — M86672 Other chronic osteomyelitis, left ankle and foot: Secondary | ICD-10-CM

## 2020-04-25 DIAGNOSIS — L97522 Non-pressure chronic ulcer of other part of left foot with fat layer exposed: Secondary | ICD-10-CM | POA: Diagnosis not present

## 2020-04-25 NOTE — Telephone Encounter (Signed)
Faxed orders, clinicals and demographics to [facility] for pre-cert and scheduling.

## 2020-04-25 NOTE — Telephone Encounter (Signed)
-----   Message from Evelina Bucy, DPM sent at 04/25/2020 12:46 PM EDT ----- Can we order MRI to eval for OM?

## 2020-04-25 NOTE — Progress Notes (Signed)
  Subjective:  Patient ID: [name], male    DOB: 1981-07-16,  MRN: [id number]  Chief Complaint  Patient presents with  . Wound Check    pt is here for a 1 week wound check.   39 y.o. male presents for wound care. Patient is more upbeat today. He states that last visit when he was told that if the wound worsens he may lose his foot that it was a wake up call and he is trying to take care of his foot better. He is being more compliant with the boot and states overall taking better care of his foot.  Objective:  Physical Exam: Wound Location: left submet 5 Wound Measurement: 3.7x3.6 post-debridement Wound Base: Granular Peri-wound: Calloused Exudate: Moderate amount Serosanguinous exudate wound without warmth, erythema, signs of acute infection Wound with significant hyperkeratotic rim.  Assessment:   1. Ulcer of left foot, with fat layer exposed ([place])   2. Chronic osteomyelitis involving left ankle and foot ([place])   3. Bone erosion    Plan:  Patient was evaluated and treated and all questions answered.  Ulcer left midfoot -Wound appears improved overall from a size perspective today however at the lateral aspect of the wound there was an area of thin wound covering firm bone. The bone was palpable but not visually exposed. XR were taken. He likely has chronic OM when compared to priors.  -Order MRI for further review. -Wound debrided, graft applied. 4x4 Artacent graft applied Tissue ID [id number]. Dressed with adaptic, 4x4, kerlix, ACE. To be left intact until next week.    No follow-ups on file.

## 2020-04-28 DIAGNOSIS — I10 Essential (primary) hypertension: Secondary | ICD-10-CM | POA: Diagnosis not present

## 2020-04-29 DIAGNOSIS — I10 Essential (primary) hypertension: Secondary | ICD-10-CM | POA: Diagnosis not present

## 2020-04-29 DIAGNOSIS — R739 Hyperglycemia, unspecified: Secondary | ICD-10-CM | POA: Diagnosis not present

## 2020-04-29 DIAGNOSIS — D631 Anemia in chronic kidney disease: Secondary | ICD-10-CM | POA: Diagnosis not present

## 2020-04-29 DIAGNOSIS — N186 End stage renal disease: Secondary | ICD-10-CM | POA: Diagnosis not present

## 2020-04-29 DIAGNOSIS — E1022 Type 1 diabetes mellitus with diabetic chronic kidney disease: Secondary | ICD-10-CM | POA: Diagnosis not present

## 2020-04-29 DIAGNOSIS — R519 Headache, unspecified: Secondary | ICD-10-CM | POA: Diagnosis not present

## 2020-04-29 DIAGNOSIS — D688 Other specified coagulation defects: Secondary | ICD-10-CM | POA: Diagnosis not present

## 2020-04-29 DIAGNOSIS — T8249XA Other complication of vascular dialysis catheter, initial encounter: Secondary | ICD-10-CM | POA: Diagnosis not present

## 2020-04-29 DIAGNOSIS — Z992 Dependence on renal dialysis: Secondary | ICD-10-CM | POA: Diagnosis not present

## 2020-04-29 DIAGNOSIS — D509 Iron deficiency anemia, unspecified: Secondary | ICD-10-CM | POA: Diagnosis not present

## 2020-04-29 DIAGNOSIS — N2581 Secondary hyperparathyroidism of renal origin: Secondary | ICD-10-CM | POA: Diagnosis not present

## 2020-04-30 ENCOUNTER — Ambulatory Visit (INDEPENDENT_AMBULATORY_CARE_PROVIDER_SITE_OTHER): Payer: Medicare HMO | Admitting: Podiatry

## 2020-04-30 DIAGNOSIS — L97522 Non-pressure chronic ulcer of other part of left foot with fat layer exposed: Secondary | ICD-10-CM | POA: Diagnosis not present

## 2020-04-30 DIAGNOSIS — M86672 Other chronic osteomyelitis, left ankle and foot: Secondary | ICD-10-CM

## 2020-04-30 DIAGNOSIS — M8580 Other specified disorders of bone density and structure, unspecified site: Secondary | ICD-10-CM | POA: Diagnosis not present

## 2020-04-30 DIAGNOSIS — E1142 Type 2 diabetes mellitus with diabetic polyneuropathy: Secondary | ICD-10-CM | POA: Diagnosis not present

## 2020-04-30 DIAGNOSIS — Z899 Acquired absence of limb, unspecified: Secondary | ICD-10-CM | POA: Diagnosis not present

## 2020-04-30 DIAGNOSIS — I10 Essential (primary) hypertension: Secondary | ICD-10-CM | POA: Diagnosis not present

## 2020-04-30 NOTE — Progress Notes (Signed)
  Subjective:  Patient ID: [name], male    DOB: [birth year],  MRN: [id number]  Chief Complaint  Patient presents with  . Foot Ulcer    Left midfoot wound. Drainage bleeding, odor and painful at time.    39 y.o. male past medical history of diabetes mellitus and ESRD on hemodialysis presents for wound care of chronic left plantar midfoot wound, he is a patient of Dr. March Rummage.  He presents today ambulating in his cam boot without any dressings on.   Review of Systems: Negative except as noted in the HPI. Denies N/V/F/Ch.   Objective:   Constitutional Well developed. Well nourished.  Vascular Dorsalis pedis pulses weakly palpable bilaterally. Posterior tibial pulses weakly palpable bilaterally.   Neurologic Normal speech. Oriented to person, place, and time. Protective sensation absent  Dermatologic Wound Location: Lt. foot plantar midfoot Wound Base: Granular and fibrotic with debris in the wound bed Peri-wound: Significant hyperkeratosis Exudate: Scant/small amount Serous exudate Wound Measurements: -3.5 cm x 4.0 cm by 0.5 cm  Orthopedic: No pain to palpation either foot.    Assessment:   1. Ulcer of left foot, with fat layer exposed ([place])   2. Bone erosion   3. Chronic osteomyelitis involving left ankle and foot ([place])   4. Diabetic polyneuropathy associated with type 2 diabetes mellitus ([place])   5. History of amputation    Plan:  Patient was evaluated and treated and all questions answered.  Ulcer  -Debridement as below. -Dressed with Iodosorb, DSD. -Continue off-loading with CAM boot -Advised he should keep the dressing on at all times and change daily.  I gave him additional dressing supplies to take home -Rx sent for Bactroban ointment, he said he has not had anything to put on it at home -He did ask me at the beginning of the visit for pain medication.  I did not prescribe him any -Follow-up with Dr. March Rummage in 1 week  Procedure: Excisional Debridement of  Wound Rationale: Removal of non-viable soft tissue from the wound to promote healing.  Anesthesia: none required Pre-Debridement Wound Measurements: 3.5 cm x 4 cm x 0.2 cm  Post-Debridement Wound Measurements: 3.5 cm x 4 cm x 0.2 cm  Type of Debridement: Sharp Excisional Tissue Removed: Non-viable soft tissue, periwound hyperkeratosis Depth of Debridement: subcutaneous tissue. Technique: Sharp excisional debridement to bleeding, viable wound base.  Dressing: Dry, sterile, compression dressing with Iodosorb Disposition: Patient tolerated procedure well. Patient to return in 1 week for follow-up with Dr. March Rummage.

## 2020-05-01 ENCOUNTER — Other Ambulatory Visit (INDEPENDENT_AMBULATORY_CARE_PROVIDER_SITE_OTHER): Payer: Self-pay | Admitting: Primary Care

## 2020-05-01 DIAGNOSIS — T8249XA Other complication of vascular dialysis catheter, initial encounter: Secondary | ICD-10-CM | POA: Diagnosis not present

## 2020-05-01 DIAGNOSIS — R739 Hyperglycemia, unspecified: Secondary | ICD-10-CM | POA: Diagnosis not present

## 2020-05-01 DIAGNOSIS — Z992 Dependence on renal dialysis: Secondary | ICD-10-CM | POA: Diagnosis not present

## 2020-05-01 DIAGNOSIS — D509 Iron deficiency anemia, unspecified: Secondary | ICD-10-CM | POA: Diagnosis not present

## 2020-05-01 DIAGNOSIS — I10 Essential (primary) hypertension: Secondary | ICD-10-CM | POA: Diagnosis not present

## 2020-05-01 DIAGNOSIS — E1022 Type 1 diabetes mellitus with diabetic chronic kidney disease: Secondary | ICD-10-CM | POA: Diagnosis not present

## 2020-05-01 DIAGNOSIS — N2581 Secondary hyperparathyroidism of renal origin: Secondary | ICD-10-CM | POA: Diagnosis not present

## 2020-05-01 DIAGNOSIS — D631 Anemia in chronic kidney disease: Secondary | ICD-10-CM | POA: Diagnosis not present

## 2020-05-01 DIAGNOSIS — D688 Other specified coagulation defects: Secondary | ICD-10-CM | POA: Diagnosis not present

## 2020-05-01 DIAGNOSIS — N186 End stage renal disease: Secondary | ICD-10-CM | POA: Diagnosis not present

## 2020-05-01 DIAGNOSIS — R519 Headache, unspecified: Secondary | ICD-10-CM | POA: Diagnosis not present

## 2020-05-01 MED ORDER — MUPIROCIN 2 % EX OINT: TOPICAL_OINTMENT | CUTANEOUS | 2 refills | Status: DC

## 2020-05-01 NOTE — Telephone Encounter (Signed)
Medication Refill - Medication: One Touch Verio or One Touch Ultra with One Touch Test Strips for whichever machine along with Delica Test strips   Pt is completely out of supplies   Has the patient contacted their pharmacy? Yes.   (Agent: If no, request that the patient contact the pharmacy for the refill.) (Agent: If yes, when and what did the pharmacy advise?)  Preferred Pharmacy (with phone number or street name):  [facility] ([place]), [place] - [address]  [address] [place] ([place]) [place] [address]  Phone: [phone] Fax: [phone]     Agent: Please be advised that RX refills may take up to 3 business days. We ask that you follow-up with your pharmacy.

## 2020-05-01 NOTE — Telephone Encounter (Signed)
Dominque from [facility] transferred a call with pharmacy tech and I was advice patient was irate and was using profanity towards [place]. Front desk took the call and spoke with pharmacy tech and advice that request for supply refill will take 24 to 72 hours. Pharmacy tech stated patient was demanding RX to be sent in today. Pharmacy tech passed the phone over to patient so that front desk could advice patient of steps for medication refill. Patient started to use profanity and stated he needed something to be done today. Patient threaten to come to the office if nothing was done. Patient was advice that PCP was not in office and would make this her priority to let PCP know of his situation in the morning. Patient kept using profanity and still demanded something to be done. Patient sated he would call if not come to the office to have this done. This is not the first time patient threaten to come into clinic demanding for prescriptions to be sent. Front desk advice patient he would need to call at least 5 to 7 days before he runs out of medication to avoid another situation were he is left with no medication.

## 2020-05-02 ENCOUNTER — Ambulatory Visit: Payer: Medicare Other | Admitting: Podiatry

## 2020-05-02 ENCOUNTER — Other Ambulatory Visit (INDEPENDENT_AMBULATORY_CARE_PROVIDER_SITE_OTHER): Payer: Self-pay | Admitting: Primary Care

## 2020-05-02 DIAGNOSIS — E1022 Type 1 diabetes mellitus with diabetic chronic kidney disease: Secondary | ICD-10-CM

## 2020-05-02 DIAGNOSIS — I10 Essential (primary) hypertension: Secondary | ICD-10-CM | POA: Diagnosis not present

## 2020-05-02 MED ORDER — GLUCOSE BLOOD VI STRP: ORAL_STRIP | 0 refills | Status: DC

## 2020-05-02 MED ORDER — ONETOUCH VERIO FLEX SYSTEM W/DEVICE KIT: 1.0000 | PACK | Freq: Three times a day (TID) | 0 refills | Status: DC

## 2020-05-03 DIAGNOSIS — D688 Other specified coagulation defects: Secondary | ICD-10-CM | POA: Diagnosis not present

## 2020-05-03 DIAGNOSIS — D509 Iron deficiency anemia, unspecified: Secondary | ICD-10-CM | POA: Diagnosis not present

## 2020-05-03 DIAGNOSIS — E1022 Type 1 diabetes mellitus with diabetic chronic kidney disease: Secondary | ICD-10-CM | POA: Diagnosis not present

## 2020-05-03 DIAGNOSIS — N2581 Secondary hyperparathyroidism of renal origin: Secondary | ICD-10-CM | POA: Diagnosis not present

## 2020-05-03 DIAGNOSIS — R739 Hyperglycemia, unspecified: Secondary | ICD-10-CM | POA: Diagnosis not present

## 2020-05-03 DIAGNOSIS — R519 Headache, unspecified: Secondary | ICD-10-CM | POA: Diagnosis not present

## 2020-05-03 DIAGNOSIS — N186 End stage renal disease: Secondary | ICD-10-CM | POA: Diagnosis not present

## 2020-05-03 DIAGNOSIS — T8249XA Other complication of vascular dialysis catheter, initial encounter: Secondary | ICD-10-CM | POA: Diagnosis not present

## 2020-05-03 DIAGNOSIS — I10 Essential (primary) hypertension: Secondary | ICD-10-CM | POA: Diagnosis not present

## 2020-05-03 DIAGNOSIS — D631 Anemia in chronic kidney disease: Secondary | ICD-10-CM | POA: Diagnosis not present

## 2020-05-03 DIAGNOSIS — Z992 Dependence on renal dialysis: Secondary | ICD-10-CM | POA: Diagnosis not present

## 2020-05-04 DIAGNOSIS — I10 Essential (primary) hypertension: Secondary | ICD-10-CM | POA: Diagnosis not present

## 2020-05-05 DIAGNOSIS — I10 Essential (primary) hypertension: Secondary | ICD-10-CM | POA: Diagnosis not present

## 2020-05-06 DIAGNOSIS — R739 Hyperglycemia, unspecified: Secondary | ICD-10-CM | POA: Diagnosis not present

## 2020-05-06 DIAGNOSIS — D631 Anemia in chronic kidney disease: Secondary | ICD-10-CM | POA: Diagnosis not present

## 2020-05-06 DIAGNOSIS — E1022 Type 1 diabetes mellitus with diabetic chronic kidney disease: Secondary | ICD-10-CM | POA: Diagnosis not present

## 2020-05-06 DIAGNOSIS — D509 Iron deficiency anemia, unspecified: Secondary | ICD-10-CM | POA: Diagnosis not present

## 2020-05-06 DIAGNOSIS — R519 Headache, unspecified: Secondary | ICD-10-CM | POA: Diagnosis not present

## 2020-05-06 DIAGNOSIS — N2581 Secondary hyperparathyroidism of renal origin: Secondary | ICD-10-CM | POA: Diagnosis not present

## 2020-05-06 DIAGNOSIS — Z992 Dependence on renal dialysis: Secondary | ICD-10-CM | POA: Diagnosis not present

## 2020-05-06 DIAGNOSIS — D688 Other specified coagulation defects: Secondary | ICD-10-CM | POA: Diagnosis not present

## 2020-05-06 DIAGNOSIS — N186 End stage renal disease: Secondary | ICD-10-CM | POA: Diagnosis not present

## 2020-05-06 DIAGNOSIS — T8249XA Other complication of vascular dialysis catheter, initial encounter: Secondary | ICD-10-CM | POA: Diagnosis not present

## 2020-05-06 DIAGNOSIS — I10 Essential (primary) hypertension: Secondary | ICD-10-CM | POA: Diagnosis not present

## 2020-05-07 DIAGNOSIS — I10 Essential (primary) hypertension: Secondary | ICD-10-CM | POA: Diagnosis not present

## 2020-05-08 DIAGNOSIS — E1022 Type 1 diabetes mellitus with diabetic chronic kidney disease: Secondary | ICD-10-CM | POA: Diagnosis not present

## 2020-05-08 DIAGNOSIS — D688 Other specified coagulation defects: Secondary | ICD-10-CM | POA: Diagnosis not present

## 2020-05-08 DIAGNOSIS — D509 Iron deficiency anemia, unspecified: Secondary | ICD-10-CM | POA: Diagnosis not present

## 2020-05-08 DIAGNOSIS — Z992 Dependence on renal dialysis: Secondary | ICD-10-CM | POA: Diagnosis not present

## 2020-05-08 DIAGNOSIS — R519 Headache, unspecified: Secondary | ICD-10-CM | POA: Diagnosis not present

## 2020-05-08 DIAGNOSIS — I10 Essential (primary) hypertension: Secondary | ICD-10-CM | POA: Diagnosis not present

## 2020-05-08 DIAGNOSIS — D631 Anemia in chronic kidney disease: Secondary | ICD-10-CM | POA: Diagnosis not present

## 2020-05-08 DIAGNOSIS — N2581 Secondary hyperparathyroidism of renal origin: Secondary | ICD-10-CM | POA: Diagnosis not present

## 2020-05-08 DIAGNOSIS — T8249XA Other complication of vascular dialysis catheter, initial encounter: Secondary | ICD-10-CM | POA: Diagnosis not present

## 2020-05-08 DIAGNOSIS — R739 Hyperglycemia, unspecified: Secondary | ICD-10-CM | POA: Diagnosis not present

## 2020-05-08 DIAGNOSIS — N186 End stage renal disease: Secondary | ICD-10-CM | POA: Diagnosis not present

## 2020-05-09 DIAGNOSIS — I10 Essential (primary) hypertension: Secondary | ICD-10-CM | POA: Diagnosis not present

## 2020-05-10 DIAGNOSIS — I10 Essential (primary) hypertension: Secondary | ICD-10-CM | POA: Diagnosis not present

## 2020-05-10 DIAGNOSIS — T8249XA Other complication of vascular dialysis catheter, initial encounter: Secondary | ICD-10-CM | POA: Diagnosis not present

## 2020-05-10 DIAGNOSIS — N186 End stage renal disease: Secondary | ICD-10-CM | POA: Diagnosis not present

## 2020-05-10 DIAGNOSIS — Z992 Dependence on renal dialysis: Secondary | ICD-10-CM | POA: Diagnosis not present

## 2020-05-10 DIAGNOSIS — R739 Hyperglycemia, unspecified: Secondary | ICD-10-CM | POA: Diagnosis not present

## 2020-05-10 DIAGNOSIS — N2581 Secondary hyperparathyroidism of renal origin: Secondary | ICD-10-CM | POA: Diagnosis not present

## 2020-05-10 DIAGNOSIS — D509 Iron deficiency anemia, unspecified: Secondary | ICD-10-CM | POA: Diagnosis not present

## 2020-05-10 DIAGNOSIS — E1022 Type 1 diabetes mellitus with diabetic chronic kidney disease: Secondary | ICD-10-CM | POA: Diagnosis not present

## 2020-05-10 DIAGNOSIS — R519 Headache, unspecified: Secondary | ICD-10-CM | POA: Diagnosis not present

## 2020-05-10 DIAGNOSIS — D631 Anemia in chronic kidney disease: Secondary | ICD-10-CM | POA: Diagnosis not present

## 2020-05-10 DIAGNOSIS — D688 Other specified coagulation defects: Secondary | ICD-10-CM | POA: Diagnosis not present

## 2020-05-11 DIAGNOSIS — I10 Essential (primary) hypertension: Secondary | ICD-10-CM | POA: Diagnosis not present

## 2020-05-12 DIAGNOSIS — I10 Essential (primary) hypertension: Secondary | ICD-10-CM | POA: Diagnosis not present

## 2020-05-13 DIAGNOSIS — R739 Hyperglycemia, unspecified: Secondary | ICD-10-CM | POA: Diagnosis not present

## 2020-05-13 DIAGNOSIS — D688 Other specified coagulation defects: Secondary | ICD-10-CM | POA: Diagnosis not present

## 2020-05-13 DIAGNOSIS — R519 Headache, unspecified: Secondary | ICD-10-CM | POA: Diagnosis not present

## 2020-05-13 DIAGNOSIS — T8249XA Other complication of vascular dialysis catheter, initial encounter: Secondary | ICD-10-CM | POA: Diagnosis not present

## 2020-05-13 DIAGNOSIS — E1022 Type 1 diabetes mellitus with diabetic chronic kidney disease: Secondary | ICD-10-CM | POA: Diagnosis not present

## 2020-05-13 DIAGNOSIS — D509 Iron deficiency anemia, unspecified: Secondary | ICD-10-CM | POA: Diagnosis not present

## 2020-05-13 DIAGNOSIS — I10 Essential (primary) hypertension: Secondary | ICD-10-CM | POA: Diagnosis not present

## 2020-05-13 DIAGNOSIS — D631 Anemia in chronic kidney disease: Secondary | ICD-10-CM | POA: Diagnosis not present

## 2020-05-13 DIAGNOSIS — N2581 Secondary hyperparathyroidism of renal origin: Secondary | ICD-10-CM | POA: Diagnosis not present

## 2020-05-13 DIAGNOSIS — N186 End stage renal disease: Secondary | ICD-10-CM | POA: Diagnosis not present

## 2020-05-13 DIAGNOSIS — Z992 Dependence on renal dialysis: Secondary | ICD-10-CM | POA: Diagnosis not present

## 2020-05-14 ENCOUNTER — Other Ambulatory Visit: Payer: Self-pay

## 2020-05-14 ENCOUNTER — Ambulatory Visit (INDEPENDENT_AMBULATORY_CARE_PROVIDER_SITE_OTHER): Payer: Medicare HMO | Admitting: Podiatry

## 2020-05-14 ENCOUNTER — Other Ambulatory Visit (INDEPENDENT_AMBULATORY_CARE_PROVIDER_SITE_OTHER): Payer: Self-pay | Admitting: Primary Care

## 2020-05-14 DIAGNOSIS — I10 Essential (primary) hypertension: Secondary | ICD-10-CM | POA: Diagnosis not present

## 2020-05-14 DIAGNOSIS — L97522 Non-pressure chronic ulcer of other part of left foot with fat layer exposed: Secondary | ICD-10-CM

## 2020-05-14 MED ORDER — NOVOLOG 100 UNIT/ML ~~LOC~~ SOLN: SUBCUTANEOUS | 3 refills | Status: DC

## 2020-05-14 NOTE — Progress Notes (Signed)
  Subjective:  Patient ID: [name], male    DOB: January 26, 1981,  MRN: [id number]  Chief Complaint  Patient presents with  . Wound Check    Denies fever/chills/nausea/vomiting.   39 y.o. male presents for wound care. Presents without dressing on his foot. Still pending MRI.  Objective:  Physical Exam: Wound Location: left submet 5 Wound Measurement: 3.8 x 4.2 post-debridement Wound Base: Granular Peri-wound: Calloused Exudate: Moderate amount Serosanguinous exudate wound without warmth, erythema, signs of acute infection Wound with significant hyperkeratotic rim.  Assessment:   1. Ulcer of left foot, with fat layer exposed ([place])    Plan:  Patient was evaluated and treated and all questions answered.  Ulcer left midfoot -Wound appears worsened with exposed bone today. Graft applied to quickly cover bone. Advised patient I am worried about the appearance of the bone and I am worried that this will result in osteomyelitis. No signs of acute infection today warranting abx. -MRI pending. -Wound debrided, graft applied. Tissue [id number].   No follow-ups on file.

## 2020-05-15 DIAGNOSIS — R519 Headache, unspecified: Secondary | ICD-10-CM | POA: Diagnosis not present

## 2020-05-15 DIAGNOSIS — D631 Anemia in chronic kidney disease: Secondary | ICD-10-CM | POA: Diagnosis not present

## 2020-05-15 DIAGNOSIS — T8249XA Other complication of vascular dialysis catheter, initial encounter: Secondary | ICD-10-CM | POA: Diagnosis not present

## 2020-05-15 DIAGNOSIS — Z992 Dependence on renal dialysis: Secondary | ICD-10-CM | POA: Diagnosis not present

## 2020-05-15 DIAGNOSIS — D509 Iron deficiency anemia, unspecified: Secondary | ICD-10-CM | POA: Diagnosis not present

## 2020-05-15 DIAGNOSIS — E1022 Type 1 diabetes mellitus with diabetic chronic kidney disease: Secondary | ICD-10-CM | POA: Diagnosis not present

## 2020-05-15 DIAGNOSIS — D688 Other specified coagulation defects: Secondary | ICD-10-CM | POA: Diagnosis not present

## 2020-05-15 DIAGNOSIS — I10 Essential (primary) hypertension: Secondary | ICD-10-CM | POA: Diagnosis not present

## 2020-05-15 DIAGNOSIS — N2581 Secondary hyperparathyroidism of renal origin: Secondary | ICD-10-CM | POA: Diagnosis not present

## 2020-05-15 DIAGNOSIS — N186 End stage renal disease: Secondary | ICD-10-CM | POA: Diagnosis not present

## 2020-05-15 DIAGNOSIS — R739 Hyperglycemia, unspecified: Secondary | ICD-10-CM | POA: Diagnosis not present

## 2020-05-16 DIAGNOSIS — I10 Essential (primary) hypertension: Secondary | ICD-10-CM | POA: Diagnosis not present

## 2020-05-17 DIAGNOSIS — I10 Essential (primary) hypertension: Secondary | ICD-10-CM | POA: Diagnosis not present

## 2020-05-18 DIAGNOSIS — I10 Essential (primary) hypertension: Secondary | ICD-10-CM | POA: Diagnosis not present

## 2020-05-19 DIAGNOSIS — I10 Essential (primary) hypertension: Secondary | ICD-10-CM | POA: Diagnosis not present

## 2020-05-20 DIAGNOSIS — N186 End stage renal disease: Secondary | ICD-10-CM | POA: Diagnosis not present

## 2020-05-20 DIAGNOSIS — I10 Essential (primary) hypertension: Secondary | ICD-10-CM | POA: Diagnosis not present

## 2020-05-20 DIAGNOSIS — Z992 Dependence on renal dialysis: Secondary | ICD-10-CM | POA: Diagnosis not present

## 2020-05-20 DIAGNOSIS — T82868A Thrombosis of vascular prosthetic devices, implants and grafts, initial encounter: Secondary | ICD-10-CM | POA: Diagnosis not present

## 2020-05-20 DIAGNOSIS — I871 Compression of vein: Secondary | ICD-10-CM | POA: Diagnosis not present

## 2020-05-21 ENCOUNTER — Ambulatory Visit (INDEPENDENT_AMBULATORY_CARE_PROVIDER_SITE_OTHER): Payer: Self-pay | Admitting: *Deleted

## 2020-05-21 ENCOUNTER — Other Ambulatory Visit (INDEPENDENT_AMBULATORY_CARE_PROVIDER_SITE_OTHER): Payer: Self-pay | Admitting: Primary Care

## 2020-05-21 ENCOUNTER — Other Ambulatory Visit: Payer: Medicare Other

## 2020-05-21 ENCOUNTER — Ambulatory Visit (INDEPENDENT_AMBULATORY_CARE_PROVIDER_SITE_OTHER): Payer: Medicare HMO | Admitting: Podiatry

## 2020-05-21 DIAGNOSIS — Z992 Dependence on renal dialysis: Secondary | ICD-10-CM | POA: Diagnosis not present

## 2020-05-21 DIAGNOSIS — N186 End stage renal disease: Secondary | ICD-10-CM

## 2020-05-21 DIAGNOSIS — Z5329 Procedure and treatment not carried out because of patient's decision for other reasons: Secondary | ICD-10-CM

## 2020-05-21 DIAGNOSIS — I10 Essential (primary) hypertension: Secondary | ICD-10-CM | POA: Diagnosis not present

## 2020-05-21 NOTE — Telephone Encounter (Signed)
Requested Prescriptions  Pending Prescriptions Disp Refills  . ONETOUCH VERIO test strip [employer] Med Name: OneTouch Verio In Vitro Strip] 100 each 0    Sig: USE AS DIRECTED     Endocrinology: Diabetes - Testing Supplies Passed - 05/21/2020  6:41 PM      Passed - Valid encounter within last 12 months    Recent Outpatient Visits          3 months ago Type 1 diabetes mellitus with other circulatory complication ([place])   [facility] Kerin Perna, NP   12 months ago Need for Tdap vaccination   [facility] Kerin Perna, NP

## 2020-05-21 NOTE — Telephone Encounter (Signed)
Patient called to request blood glucose test strips. Patient reports he has used all test strips for new blood glucose monitoring system (ONE TOUCH VERIO FLEX SYSTEM) Test strips reordered and patient will be notified to call pharmacy for pick up.   Reason for Disposition . [1] Prescription refill request for ESSENTIAL medicine (i.e., likelihood of harm to patient if not taken) AND [2] triager unable to refill per department policy  Answer Assessment - Initial Assessment Questions 1. DRUG NAME: "What medicine do you need to have refilled?"     Blood glucose test strips for blood glucose monitoring system (ONE TOUCH VERIO FLEX SYSTEM) 2. REFILLS REMAINING: "How many refills are remaining?" (Note: The label on the medicine or pill bottle will show how many refills are remaining. If there are no refills remaining, then a renewal may be needed.)     0 3. EXPIRATION DATE: "What is the expiration date?" (Note: The label states when the prescription will expire, and thus can no longer be refilled.)     na 4. PRESCRIBING HCP: "Who prescribed it?" Reason: If prescribed by specialist, call should be referred to that group.     [facility] NP per patient  5. SYMPTOMS: "Do you have any symptoms?"     No but does not want to have any  Protocols used: MEDICATION REFILL AND RENEWAL CALL-A-AH

## 2020-05-21 NOTE — Progress Notes (Signed)
No show for appt. 

## 2020-05-22 DIAGNOSIS — N186 End stage renal disease: Secondary | ICD-10-CM | POA: Diagnosis not present

## 2020-05-22 DIAGNOSIS — D509 Iron deficiency anemia, unspecified: Secondary | ICD-10-CM | POA: Diagnosis not present

## 2020-05-22 DIAGNOSIS — Z992 Dependence on renal dialysis: Secondary | ICD-10-CM | POA: Diagnosis not present

## 2020-05-22 DIAGNOSIS — I10 Essential (primary) hypertension: Secondary | ICD-10-CM | POA: Diagnosis not present

## 2020-05-22 DIAGNOSIS — E1022 Type 1 diabetes mellitus with diabetic chronic kidney disease: Secondary | ICD-10-CM | POA: Diagnosis not present

## 2020-05-22 DIAGNOSIS — T8249XA Other complication of vascular dialysis catheter, initial encounter: Secondary | ICD-10-CM | POA: Diagnosis not present

## 2020-05-22 DIAGNOSIS — R739 Hyperglycemia, unspecified: Secondary | ICD-10-CM | POA: Diagnosis not present

## 2020-05-22 DIAGNOSIS — D688 Other specified coagulation defects: Secondary | ICD-10-CM | POA: Diagnosis not present

## 2020-05-22 DIAGNOSIS — N2581 Secondary hyperparathyroidism of renal origin: Secondary | ICD-10-CM | POA: Diagnosis not present

## 2020-05-22 DIAGNOSIS — D631 Anemia in chronic kidney disease: Secondary | ICD-10-CM | POA: Diagnosis not present

## 2020-05-22 DIAGNOSIS — R519 Headache, unspecified: Secondary | ICD-10-CM | POA: Diagnosis not present

## 2020-05-23 DIAGNOSIS — I10 Essential (primary) hypertension: Secondary | ICD-10-CM | POA: Diagnosis not present

## 2020-05-24 DIAGNOSIS — R519 Headache, unspecified: Secondary | ICD-10-CM | POA: Diagnosis not present

## 2020-05-24 DIAGNOSIS — Z992 Dependence on renal dialysis: Secondary | ICD-10-CM | POA: Diagnosis not present

## 2020-05-24 DIAGNOSIS — N186 End stage renal disease: Secondary | ICD-10-CM | POA: Diagnosis not present

## 2020-05-24 DIAGNOSIS — I10 Essential (primary) hypertension: Secondary | ICD-10-CM | POA: Diagnosis not present

## 2020-05-24 DIAGNOSIS — R739 Hyperglycemia, unspecified: Secondary | ICD-10-CM | POA: Diagnosis not present

## 2020-05-24 DIAGNOSIS — T8249XA Other complication of vascular dialysis catheter, initial encounter: Secondary | ICD-10-CM | POA: Diagnosis not present

## 2020-05-24 DIAGNOSIS — D509 Iron deficiency anemia, unspecified: Secondary | ICD-10-CM | POA: Diagnosis not present

## 2020-05-24 DIAGNOSIS — D631 Anemia in chronic kidney disease: Secondary | ICD-10-CM | POA: Diagnosis not present

## 2020-05-24 DIAGNOSIS — E1022 Type 1 diabetes mellitus with diabetic chronic kidney disease: Secondary | ICD-10-CM | POA: Diagnosis not present

## 2020-05-24 DIAGNOSIS — D688 Other specified coagulation defects: Secondary | ICD-10-CM | POA: Diagnosis not present

## 2020-05-24 DIAGNOSIS — N2581 Secondary hyperparathyroidism of renal origin: Secondary | ICD-10-CM | POA: Diagnosis not present

## 2020-05-25 DIAGNOSIS — I10 Essential (primary) hypertension: Secondary | ICD-10-CM | POA: Diagnosis not present

## 2020-05-26 DIAGNOSIS — I10 Essential (primary) hypertension: Secondary | ICD-10-CM | POA: Diagnosis not present

## 2020-05-27 DIAGNOSIS — Z992 Dependence on renal dialysis: Secondary | ICD-10-CM | POA: Diagnosis not present

## 2020-05-27 DIAGNOSIS — N2581 Secondary hyperparathyroidism of renal origin: Secondary | ICD-10-CM | POA: Diagnosis not present

## 2020-05-27 DIAGNOSIS — T8249XA Other complication of vascular dialysis catheter, initial encounter: Secondary | ICD-10-CM | POA: Diagnosis not present

## 2020-05-27 DIAGNOSIS — D509 Iron deficiency anemia, unspecified: Secondary | ICD-10-CM | POA: Diagnosis not present

## 2020-05-27 DIAGNOSIS — R739 Hyperglycemia, unspecified: Secondary | ICD-10-CM | POA: Diagnosis not present

## 2020-05-27 DIAGNOSIS — I10 Essential (primary) hypertension: Secondary | ICD-10-CM | POA: Diagnosis not present

## 2020-05-27 DIAGNOSIS — D631 Anemia in chronic kidney disease: Secondary | ICD-10-CM | POA: Diagnosis not present

## 2020-05-27 DIAGNOSIS — E1022 Type 1 diabetes mellitus with diabetic chronic kidney disease: Secondary | ICD-10-CM | POA: Diagnosis not present

## 2020-05-27 DIAGNOSIS — N186 End stage renal disease: Secondary | ICD-10-CM | POA: Diagnosis not present

## 2020-05-27 DIAGNOSIS — R519 Headache, unspecified: Secondary | ICD-10-CM | POA: Diagnosis not present

## 2020-05-27 DIAGNOSIS — D688 Other specified coagulation defects: Secondary | ICD-10-CM | POA: Diagnosis not present

## 2020-05-28 DIAGNOSIS — N186 End stage renal disease: Secondary | ICD-10-CM | POA: Diagnosis not present

## 2020-05-28 DIAGNOSIS — I10 Essential (primary) hypertension: Secondary | ICD-10-CM | POA: Diagnosis not present

## 2020-05-28 DIAGNOSIS — Z992 Dependence on renal dialysis: Secondary | ICD-10-CM | POA: Diagnosis not present

## 2020-05-28 DIAGNOSIS — I129 Hypertensive chronic kidney disease with stage 1 through stage 4 chronic kidney disease, or unspecified chronic kidney disease: Secondary | ICD-10-CM | POA: Diagnosis not present

## 2020-05-29 DIAGNOSIS — N2581 Secondary hyperparathyroidism of renal origin: Secondary | ICD-10-CM | POA: Diagnosis not present

## 2020-05-29 DIAGNOSIS — D631 Anemia in chronic kidney disease: Secondary | ICD-10-CM | POA: Diagnosis not present

## 2020-05-29 DIAGNOSIS — N186 End stage renal disease: Secondary | ICD-10-CM | POA: Diagnosis not present

## 2020-05-29 DIAGNOSIS — A499 Bacterial infection, unspecified: Secondary | ICD-10-CM | POA: Diagnosis not present

## 2020-05-29 DIAGNOSIS — T8249XA Other complication of vascular dialysis catheter, initial encounter: Secondary | ICD-10-CM | POA: Diagnosis not present

## 2020-05-29 DIAGNOSIS — E1022 Type 1 diabetes mellitus with diabetic chronic kidney disease: Secondary | ICD-10-CM | POA: Diagnosis not present

## 2020-05-29 DIAGNOSIS — D509 Iron deficiency anemia, unspecified: Secondary | ICD-10-CM | POA: Diagnosis not present

## 2020-05-29 DIAGNOSIS — Z992 Dependence on renal dialysis: Secondary | ICD-10-CM | POA: Diagnosis not present

## 2020-05-29 DIAGNOSIS — I10 Essential (primary) hypertension: Secondary | ICD-10-CM | POA: Diagnosis not present

## 2020-05-29 DIAGNOSIS — D688 Other specified coagulation defects: Secondary | ICD-10-CM | POA: Diagnosis not present

## 2020-05-30 ENCOUNTER — Emergency Department (HOSPITAL_COMMUNITY): Payer: Medicare HMO

## 2020-05-30 ENCOUNTER — Encounter (HOSPITAL_COMMUNITY): Payer: Self-pay | Admitting: Emergency Medicine

## 2020-05-30 ENCOUNTER — Other Ambulatory Visit: Payer: Self-pay

## 2020-05-30 ENCOUNTER — Inpatient Hospital Stay (HOSPITAL_COMMUNITY)
Admission: EM | Admit: 2020-05-30 | Discharge: 2020-05-31 | DRG: 638 | Discharge status: Against Medical Advice | Payer: Medicare HMO | Attending: Internal Medicine | Admitting: Internal Medicine

## 2020-05-30 DIAGNOSIS — S0990XA Unspecified injury of head, initial encounter: Secondary | ICD-10-CM | POA: Diagnosis not present

## 2020-05-30 DIAGNOSIS — N186 End stage renal disease: Secondary | ICD-10-CM | POA: Diagnosis present

## 2020-05-30 DIAGNOSIS — R404 Transient alteration of awareness: Secondary | ICD-10-CM | POA: Diagnosis not present

## 2020-05-30 DIAGNOSIS — E1069 Type 1 diabetes mellitus with other specified complication: Secondary | ICD-10-CM | POA: Diagnosis not present

## 2020-05-30 DIAGNOSIS — M542 Cervicalgia: Secondary | ICD-10-CM | POA: Diagnosis not present

## 2020-05-30 DIAGNOSIS — I2510 Atherosclerotic heart disease of native coronary artery without angina pectoris: Secondary | ICD-10-CM | POA: Diagnosis not present

## 2020-05-30 DIAGNOSIS — E10621 Type 1 diabetes mellitus with foot ulcer: Secondary | ICD-10-CM | POA: Diagnosis present

## 2020-05-30 DIAGNOSIS — I700 Atherosclerosis of aorta: Secondary | ICD-10-CM | POA: Diagnosis not present

## 2020-05-30 DIAGNOSIS — I959 Hypotension, unspecified: Secondary | ICD-10-CM | POA: Diagnosis not present

## 2020-05-30 DIAGNOSIS — R52 Pain, unspecified: Secondary | ICD-10-CM | POA: Diagnosis not present

## 2020-05-30 DIAGNOSIS — Z79899 Other long term (current) drug therapy: Secondary | ICD-10-CM

## 2020-05-30 DIAGNOSIS — I10 Essential (primary) hypertension: Secondary | ICD-10-CM | POA: Diagnosis not present

## 2020-05-30 DIAGNOSIS — R079 Chest pain, unspecified: Secondary | ICD-10-CM | POA: Diagnosis not present

## 2020-05-30 DIAGNOSIS — L97426 Non-pressure chronic ulcer of left heel and midfoot with bone involvement without evidence of necrosis: Secondary | ICD-10-CM | POA: Diagnosis present

## 2020-05-30 DIAGNOSIS — E1022 Type 1 diabetes mellitus with diabetic chronic kidney disease: Secondary | ICD-10-CM | POA: Diagnosis present

## 2020-05-30 DIAGNOSIS — J320 Chronic maxillary sinusitis: Secondary | ICD-10-CM | POA: Diagnosis not present

## 2020-05-30 DIAGNOSIS — L089 Local infection of the skin and subcutaneous tissue, unspecified: Secondary | ICD-10-CM | POA: Diagnosis present

## 2020-05-30 DIAGNOSIS — Z6831 Body mass index (BMI) 31.0-31.9, adult: Secondary | ICD-10-CM

## 2020-05-30 DIAGNOSIS — IMO0002 Reserved for concepts with insufficient information to code with codable children: Secondary | ICD-10-CM | POA: Diagnosis present

## 2020-05-30 DIAGNOSIS — J3489 Other specified disorders of nose and nasal sinuses: Secondary | ICD-10-CM | POA: Diagnosis not present

## 2020-05-30 DIAGNOSIS — E1165 Type 2 diabetes mellitus with hyperglycemia: Secondary | ICD-10-CM | POA: Diagnosis not present

## 2020-05-30 DIAGNOSIS — S3991XA Unspecified injury of abdomen, initial encounter: Secondary | ICD-10-CM | POA: Diagnosis present

## 2020-05-30 DIAGNOSIS — Z5329 Procedure and treatment not carried out because of patient's decision for other reasons: Secondary | ICD-10-CM | POA: Diagnosis present

## 2020-05-30 DIAGNOSIS — E08621 Diabetes mellitus due to underlying condition with foot ulcer: Secondary | ICD-10-CM | POA: Diagnosis present

## 2020-05-30 DIAGNOSIS — M86172 Other acute osteomyelitis, left ankle and foot: Secondary | ICD-10-CM | POA: Diagnosis not present

## 2020-05-30 DIAGNOSIS — N2581 Secondary hyperparathyroidism of renal origin: Secondary | ICD-10-CM | POA: Diagnosis present

## 2020-05-30 DIAGNOSIS — Y92410 Unspecified street and highway as the place of occurrence of the external cause: Secondary | ICD-10-CM

## 2020-05-30 DIAGNOSIS — Z041 Encounter for examination and observation following transport accident: Secondary | ICD-10-CM | POA: Diagnosis not present

## 2020-05-30 DIAGNOSIS — R109 Unspecified abdominal pain: Secondary | ICD-10-CM | POA: Diagnosis not present

## 2020-05-30 DIAGNOSIS — L97529 Non-pressure chronic ulcer of other part of left foot with unspecified severity: Secondary | ICD-10-CM | POA: Diagnosis not present

## 2020-05-30 DIAGNOSIS — S199XXA Unspecified injury of neck, initial encounter: Secondary | ICD-10-CM | POA: Diagnosis not present

## 2020-05-30 DIAGNOSIS — K3184 Gastroparesis: Secondary | ICD-10-CM | POA: Diagnosis present

## 2020-05-30 DIAGNOSIS — Z8616 Personal history of COVID-19: Secondary | ICD-10-CM

## 2020-05-30 DIAGNOSIS — E1043 Type 1 diabetes mellitus with diabetic autonomic (poly)neuropathy: Secondary | ICD-10-CM | POA: Diagnosis present

## 2020-05-30 DIAGNOSIS — E46 Unspecified protein-calorie malnutrition: Secondary | ICD-10-CM | POA: Diagnosis present

## 2020-05-30 DIAGNOSIS — Z992 Dependence on renal dialysis: Secondary | ICD-10-CM

## 2020-05-30 DIAGNOSIS — Z833 Family history of diabetes mellitus: Secondary | ICD-10-CM

## 2020-05-30 DIAGNOSIS — Z794 Long term (current) use of insulin: Secondary | ICD-10-CM

## 2020-05-30 DIAGNOSIS — Z841 Family history of disorders of kidney and ureter: Secondary | ICD-10-CM

## 2020-05-30 LAB — COMPREHENSIVE METABOLIC PANEL
ALT: 11 U/L (ref 0–44)
AST: 26 U/L (ref 15–41)
Albumin: 3 g/dL — ABNORMAL LOW (ref 3.5–5.0)
Alkaline Phosphatase: 141 U/L — ABNORMAL HIGH (ref 38–126)
Anion gap: 14 (ref 5–15)
BUN: 34 mg/dL — ABNORMAL HIGH (ref 6–20)
CO2: 23 mmol/L (ref 22–32)
Calcium: 9.1 mg/dL (ref 8.9–10.3)
Chloride: 91 mmol/L — ABNORMAL LOW (ref 98–111)
Creatinine, Ser: 9.58 mg/dL — ABNORMAL HIGH (ref 0.61–1.24)
GFR calc Af Amer: 7 mL/min — ABNORMAL LOW (ref >60)
GFR calc non Af Amer: 6 mL/min — ABNORMAL LOW (ref >60)
Glucose, Bld: 351 mg/dL — ABNORMAL HIGH (ref 70–99)
Potassium: 4.7 mmol/L (ref 3.5–5.1)
Sodium: 128 mmol/L — ABNORMAL LOW (ref 135–145)
Total Bilirubin: 1.1 mg/dL (ref 0.3–1.2)
Total Protein: 7.5 g/dL (ref 6.5–8.1)

## 2020-05-30 LAB — CBC
HCT: 45.6 % (ref 39.0–52.0)
Hemoglobin: 13.8 g/dL (ref 13.0–17.0)
MCH: 29 pg (ref 26.0–34.0)
MCHC: 30.3 g/dL (ref 30.0–36.0)
MCV: 95.8 fL (ref 80.0–100.0)
Platelets: 159 10*3/uL (ref 150–400)
RBC: 4.76 MIL/uL (ref 4.22–5.81)
RDW: 14.9 % (ref 11.5–15.5)
WBC: 10.9 10*3/uL — ABNORMAL HIGH (ref 4.0–10.5)
nRBC: 0 % (ref 0.0–0.2)

## 2020-05-30 LAB — PROTIME-INR
INR: 1 (ref 0.8–1.2)
Prothrombin Time: 13.1 seconds (ref 11.4–15.2)

## 2020-05-30 LAB — I-STAT CHEM 8, ED
BUN: 37 mg/dL — ABNORMAL HIGH (ref 6–20)
Calcium, Ion: 1.13 mmol/L — ABNORMAL LOW (ref 1.15–1.40)
Chloride: 95 mmol/L — ABNORMAL LOW (ref 98–111)
Creatinine, Ser: 9.3 mg/dL — ABNORMAL HIGH (ref 0.61–1.24)
Glucose, Bld: 339 mg/dL — ABNORMAL HIGH (ref 70–99)
HCT: 49 % (ref 39.0–52.0)
Hemoglobin: 16.7 g/dL (ref 13.0–17.0)
Potassium: 4.6 mmol/L (ref 3.5–5.1)
Sodium: 130 mmol/L — ABNORMAL LOW (ref 135–145)
TCO2: 26 mmol/L (ref 22–32)

## 2020-05-30 LAB — SAMPLE TO BLOOD BANK

## 2020-05-30 LAB — APTT: aPTT: 31 seconds (ref 24–36)

## 2020-05-30 LAB — ETHANOL: Alcohol, Ethyl (B): <10 mg/dL (ref <10)

## 2020-05-30 LAB — LACTIC ACID, PLASMA: Lactic Acid, Venous: 1.8 mmol/L (ref 0.5–1.9)

## 2020-05-30 MED ORDER — OXYCODONE-ACETAMINOPHEN 5-325 MG PO TABS
2.0000 | ORAL_TABLET | Freq: Once | ORAL | Status: AC
  Administered 2020-05-30: 2 via ORAL
  Filled 2020-05-30: qty 2

## 2020-05-30 MED ORDER — FENTANYL CITRATE (PF) 100 MCG/2ML IJ SOLN
50.0000 ug | Freq: Once | INTRAMUSCULAR | Status: AC
  Administered 2020-05-30: 50 ug via INTRAVENOUS
  Filled 2020-05-30: qty 2

## 2020-05-30 MED ORDER — PIPERACILLIN-TAZOBACTAM 3.375 G IVPB 30 MIN
3.3750 g | Freq: Once | INTRAVENOUS | Status: AC
  Administered 2020-05-30: 3.375 g via INTRAVENOUS
  Filled 2020-05-30: qty 50
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
## 2020-05-30 NOTE — ED Notes (Signed)
Patient transported to CT scan . 
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
## 2020-05-30 NOTE — ED Notes (Signed)
Patient transported back to CT scan.

## 2020-05-31 DIAGNOSIS — Z794 Long term (current) use of insulin: Secondary | ICD-10-CM | POA: Diagnosis not present

## 2020-05-31 DIAGNOSIS — L97426 Non-pressure chronic ulcer of left heel and midfoot with bone involvement without evidence of necrosis: Secondary | ICD-10-CM

## 2020-05-31 DIAGNOSIS — E08621 Diabetes mellitus due to underlying condition with foot ulcer: Secondary | ICD-10-CM | POA: Diagnosis not present

## 2020-05-31 DIAGNOSIS — Z79899 Other long term (current) drug therapy: Secondary | ICD-10-CM | POA: Diagnosis not present

## 2020-05-31 DIAGNOSIS — E46 Unspecified protein-calorie malnutrition: Secondary | ICD-10-CM | POA: Diagnosis not present

## 2020-05-31 DIAGNOSIS — Z6831 Body mass index (BMI) 31.0-31.9, adult: Secondary | ICD-10-CM | POA: Diagnosis not present

## 2020-05-31 DIAGNOSIS — I10 Essential (primary) hypertension: Secondary | ICD-10-CM | POA: Diagnosis not present

## 2020-05-31 DIAGNOSIS — R079 Chest pain, unspecified: Secondary | ICD-10-CM | POA: Diagnosis not present

## 2020-05-31 DIAGNOSIS — E1069 Type 1 diabetes mellitus with other specified complication: Secondary | ICD-10-CM | POA: Diagnosis not present

## 2020-05-31 DIAGNOSIS — E11621 Type 2 diabetes mellitus with foot ulcer: Secondary | ICD-10-CM | POA: Insufficient documentation

## 2020-05-31 DIAGNOSIS — N186 End stage renal disease: Secondary | ICD-10-CM | POA: Diagnosis not present

## 2020-05-31 DIAGNOSIS — Z992 Dependence on renal dialysis: Secondary | ICD-10-CM | POA: Diagnosis not present

## 2020-05-31 DIAGNOSIS — S3991XA Unspecified injury of abdomen, initial encounter: Secondary | ICD-10-CM | POA: Diagnosis present

## 2020-05-31 DIAGNOSIS — N2581 Secondary hyperparathyroidism of renal origin: Secondary | ICD-10-CM | POA: Diagnosis present

## 2020-05-31 DIAGNOSIS — K3184 Gastroparesis: Secondary | ICD-10-CM | POA: Diagnosis present

## 2020-05-31 DIAGNOSIS — Z5329 Procedure and treatment not carried out because of patient's decision for other reasons: Secondary | ICD-10-CM | POA: Diagnosis present

## 2020-05-31 DIAGNOSIS — M86172 Other acute osteomyelitis, left ankle and foot: Secondary | ICD-10-CM | POA: Diagnosis not present

## 2020-05-31 DIAGNOSIS — L97409 Non-pressure chronic ulcer of unspecified heel and midfoot with unspecified severity: Secondary | ICD-10-CM | POA: Insufficient documentation

## 2020-05-31 DIAGNOSIS — Z841 Family history of disorders of kidney and ureter: Secondary | ICD-10-CM | POA: Diagnosis not present

## 2020-05-31 DIAGNOSIS — E1022 Type 1 diabetes mellitus with diabetic chronic kidney disease: Secondary | ICD-10-CM | POA: Diagnosis not present

## 2020-05-31 DIAGNOSIS — Z833 Family history of diabetes mellitus: Secondary | ICD-10-CM | POA: Diagnosis not present

## 2020-05-31 DIAGNOSIS — E1043 Type 1 diabetes mellitus with diabetic autonomic (poly)neuropathy: Secondary | ICD-10-CM | POA: Diagnosis present

## 2020-05-31 DIAGNOSIS — Z8616 Personal history of COVID-19: Secondary | ICD-10-CM | POA: Diagnosis not present

## 2020-05-31 DIAGNOSIS — E10621 Type 1 diabetes mellitus with foot ulcer: Secondary | ICD-10-CM | POA: Diagnosis present

## 2020-05-31 DIAGNOSIS — Y92410 Unspecified street and highway as the place of occurrence of the external cause: Secondary | ICD-10-CM | POA: Diagnosis not present

## 2020-05-31 MED ORDER — ALBUTEROL SULFATE HFA 108 (90 BASE) MCG/ACT IN AERS
2.0000 | INHALATION_SPRAY | Freq: Four times a day (QID) | RESPIRATORY_TRACT | Status: DC | PRN
Start: 2020-05-31 — End: 2020-05-31

## 2020-05-31 MED ORDER — POLYETHYLENE GLYCOL 3350 17 G PO PACK: 17.0000 g | PACK | Freq: Every day | ORAL | Status: DC

## 2020-05-31 MED ORDER — SEVELAMER CARBONATE 800 MG PO TABS
2400.0000 mg | ORAL_TABLET | ORAL | Status: DC
Start: 2020-05-31 — End: 2020-05-31

## 2020-05-31 MED ORDER — INSULIN GLARGINE 100 UNIT/ML ~~LOC~~ SOLN
12.0000 [IU] | Freq: Every day | SUBCUTANEOUS | Status: DC
  Filled 2020-05-31: qty 0.12

## 2020-05-31 MED ORDER — ONDANSETRON HCL 4 MG/2ML IJ SOLN: 4.0000 mg | Freq: Four times a day (QID) | INTRAMUSCULAR | Status: DC | PRN

## 2020-05-31 MED ORDER — CALCITRIOL 0.5 MCG PO CAPS: 2.5000 ug | ORAL_CAPSULE | ORAL | Status: DC

## 2020-05-31 MED ORDER — HEPARIN SODIUM (PORCINE) 5000 UNIT/ML IJ SOLN: 5000.0000 [IU] | Freq: Three times a day (TID) | INTRAMUSCULAR | Status: DC

## 2020-05-31 MED ORDER — TRAMADOL HCL 50 MG PO TABS: 50.0000 mg | ORAL_TABLET | Freq: Three times a day (TID) | ORAL | Status: DC | PRN

## 2020-05-31 MED ORDER — OXYCODONE HCL 5 MG PO TABS: 5.0000 mg | ORAL_TABLET | ORAL | Status: DC | PRN

## 2020-05-31 MED ORDER — CINACALCET HCL 30 MG PO TABS: 120.0000 mg | ORAL_TABLET | Freq: Every day | ORAL | Status: DC

## 2020-05-31 MED ORDER — INSULIN ASPART 100 UNIT/ML ~~LOC~~ SOLN: 6.0000 [IU] | Freq: Three times a day (TID) | SUBCUTANEOUS | Status: DC

## 2020-05-31 MED ORDER — ONDANSETRON HCL 4 MG PO TABS: 4.0000 mg | ORAL_TABLET | Freq: Four times a day (QID) | ORAL | Status: DC | PRN

## 2020-05-31 NOTE — ED Notes (Signed)
Patient given [place] sandwich / apple sauce /graham crackers and water . Patient refused Covid test .
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
## 2020-05-31 NOTE — ED Notes (Signed)
Pt stepped out of the room and came to the nurses station. Pt is agitated and asked to speak to his provider over his admission status. Pt directed back to his room and asked to wait for his provider to speak with them.

## 2020-05-31 NOTE — ED Notes (Addendum)
Patient became argumentative with admission MD during encounter , MD explained plan of care/admission to patient but decided to leave AMA .

## 2020-06-01 DIAGNOSIS — I10 Essential (primary) hypertension: Secondary | ICD-10-CM | POA: Diagnosis not present

## 2020-06-02 DIAGNOSIS — I10 Essential (primary) hypertension: Secondary | ICD-10-CM | POA: Diagnosis not present

## 2020-06-03 DIAGNOSIS — Z992 Dependence on renal dialysis: Secondary | ICD-10-CM | POA: Diagnosis not present

## 2020-06-03 DIAGNOSIS — N186 End stage renal disease: Secondary | ICD-10-CM | POA: Diagnosis not present

## 2020-06-03 DIAGNOSIS — D631 Anemia in chronic kidney disease: Secondary | ICD-10-CM | POA: Diagnosis not present

## 2020-06-03 DIAGNOSIS — N2581 Secondary hyperparathyroidism of renal origin: Secondary | ICD-10-CM | POA: Diagnosis not present

## 2020-06-03 DIAGNOSIS — I10 Essential (primary) hypertension: Secondary | ICD-10-CM | POA: Diagnosis not present

## 2020-06-03 DIAGNOSIS — D688 Other specified coagulation defects: Secondary | ICD-10-CM | POA: Diagnosis not present

## 2020-06-03 DIAGNOSIS — A499 Bacterial infection, unspecified: Secondary | ICD-10-CM | POA: Diagnosis not present

## 2020-06-03 DIAGNOSIS — D509 Iron deficiency anemia, unspecified: Secondary | ICD-10-CM | POA: Diagnosis not present

## 2020-06-03 DIAGNOSIS — E1022 Type 1 diabetes mellitus with diabetic chronic kidney disease: Secondary | ICD-10-CM | POA: Diagnosis not present

## 2020-06-03 DIAGNOSIS — T8249XA Other complication of vascular dialysis catheter, initial encounter: Secondary | ICD-10-CM | POA: Diagnosis not present

## 2020-06-04 ENCOUNTER — Telehealth: Payer: Self-pay | Admitting: Podiatry

## 2020-06-04 DIAGNOSIS — I10 Essential (primary) hypertension: Secondary | ICD-10-CM | POA: Diagnosis not present

## 2020-06-04 LAB — CULTURE, BLOOD (SINGLE)
Culture: NO GROWTH
Special Requests: ADEQUATE

## 2020-06-04 NOTE — Telephone Encounter (Signed)
Pt called to reschedule his last missed appointment because he has been having a lot of other procedures and wanted to let you know that he has not been able to go to the other appointments you sent him out for. He is coming back in this Friday at 2:45

## 2020-06-05 DIAGNOSIS — D688 Other specified coagulation defects: Secondary | ICD-10-CM | POA: Diagnosis not present

## 2020-06-05 DIAGNOSIS — E1022 Type 1 diabetes mellitus with diabetic chronic kidney disease: Secondary | ICD-10-CM | POA: Diagnosis not present

## 2020-06-05 DIAGNOSIS — N2581 Secondary hyperparathyroidism of renal origin: Secondary | ICD-10-CM | POA: Diagnosis not present

## 2020-06-05 DIAGNOSIS — D509 Iron deficiency anemia, unspecified: Secondary | ICD-10-CM | POA: Diagnosis not present

## 2020-06-05 DIAGNOSIS — N186 End stage renal disease: Secondary | ICD-10-CM | POA: Diagnosis not present

## 2020-06-05 DIAGNOSIS — A499 Bacterial infection, unspecified: Secondary | ICD-10-CM | POA: Diagnosis not present

## 2020-06-05 DIAGNOSIS — Z992 Dependence on renal dialysis: Secondary | ICD-10-CM | POA: Diagnosis not present

## 2020-06-05 DIAGNOSIS — T8249XA Other complication of vascular dialysis catheter, initial encounter: Secondary | ICD-10-CM | POA: Diagnosis not present

## 2020-06-05 DIAGNOSIS — I10 Essential (primary) hypertension: Secondary | ICD-10-CM | POA: Diagnosis not present

## 2020-06-05 DIAGNOSIS — D631 Anemia in chronic kidney disease: Secondary | ICD-10-CM | POA: Diagnosis not present

## 2020-06-06 DIAGNOSIS — I10 Essential (primary) hypertension: Secondary | ICD-10-CM | POA: Diagnosis not present

## 2020-06-07 ENCOUNTER — Ambulatory Visit (INDEPENDENT_AMBULATORY_CARE_PROVIDER_SITE_OTHER): Payer: Medicare HMO | Admitting: Podiatry

## 2020-06-07 ENCOUNTER — Other Ambulatory Visit: Payer: Self-pay | Admitting: Podiatry

## 2020-06-07 ENCOUNTER — Other Ambulatory Visit: Payer: Self-pay

## 2020-06-07 DIAGNOSIS — L97524 Non-pressure chronic ulcer of other part of left foot with necrosis of bone: Secondary | ICD-10-CM

## 2020-06-07 DIAGNOSIS — D509 Iron deficiency anemia, unspecified: Secondary | ICD-10-CM | POA: Diagnosis not present

## 2020-06-07 DIAGNOSIS — Z992 Dependence on renal dialysis: Secondary | ICD-10-CM | POA: Diagnosis not present

## 2020-06-07 DIAGNOSIS — T8249XA Other complication of vascular dialysis catheter, initial encounter: Secondary | ICD-10-CM | POA: Diagnosis not present

## 2020-06-07 DIAGNOSIS — A499 Bacterial infection, unspecified: Secondary | ICD-10-CM | POA: Diagnosis not present

## 2020-06-07 DIAGNOSIS — D631 Anemia in chronic kidney disease: Secondary | ICD-10-CM | POA: Diagnosis not present

## 2020-06-07 DIAGNOSIS — E1022 Type 1 diabetes mellitus with diabetic chronic kidney disease: Secondary | ICD-10-CM | POA: Diagnosis not present

## 2020-06-07 DIAGNOSIS — N2581 Secondary hyperparathyroidism of renal origin: Secondary | ICD-10-CM | POA: Diagnosis not present

## 2020-06-07 DIAGNOSIS — M86672 Other chronic osteomyelitis, left ankle and foot: Secondary | ICD-10-CM | POA: Diagnosis not present

## 2020-06-07 DIAGNOSIS — M869 Osteomyelitis, unspecified: Secondary | ICD-10-CM | POA: Diagnosis not present

## 2020-06-07 DIAGNOSIS — N186 End stage renal disease: Secondary | ICD-10-CM | POA: Diagnosis not present

## 2020-06-07 DIAGNOSIS — D688 Other specified coagulation defects: Secondary | ICD-10-CM | POA: Diagnosis not present

## 2020-06-07 DIAGNOSIS — I10 Essential (primary) hypertension: Secondary | ICD-10-CM | POA: Diagnosis not present

## 2020-06-08 DIAGNOSIS — I10 Essential (primary) hypertension: Secondary | ICD-10-CM | POA: Diagnosis not present

## 2020-06-09 DIAGNOSIS — I10 Essential (primary) hypertension: Secondary | ICD-10-CM | POA: Diagnosis not present

## 2020-06-10 DIAGNOSIS — I10 Essential (primary) hypertension: Secondary | ICD-10-CM | POA: Diagnosis not present

## 2020-06-11 DIAGNOSIS — I10 Essential (primary) hypertension: Secondary | ICD-10-CM | POA: Diagnosis not present

## 2020-06-11 LAB — PATHOLOGY REPORT

## 2020-06-11 LAB — TISSUE SPECIMEN

## 2020-06-12 ENCOUNTER — Telehealth: Payer: Self-pay | Admitting: Podiatry

## 2020-06-12 ENCOUNTER — Other Ambulatory Visit: Payer: Self-pay | Admitting: Podiatry

## 2020-06-12 DIAGNOSIS — N186 End stage renal disease: Secondary | ICD-10-CM | POA: Diagnosis not present

## 2020-06-12 DIAGNOSIS — I10 Essential (primary) hypertension: Secondary | ICD-10-CM | POA: Diagnosis not present

## 2020-06-12 DIAGNOSIS — A499 Bacterial infection, unspecified: Secondary | ICD-10-CM | POA: Diagnosis not present

## 2020-06-12 DIAGNOSIS — N2581 Secondary hyperparathyroidism of renal origin: Secondary | ICD-10-CM | POA: Diagnosis not present

## 2020-06-12 DIAGNOSIS — D688 Other specified coagulation defects: Secondary | ICD-10-CM | POA: Diagnosis not present

## 2020-06-12 DIAGNOSIS — T8249XA Other complication of vascular dialysis catheter, initial encounter: Secondary | ICD-10-CM | POA: Diagnosis not present

## 2020-06-12 DIAGNOSIS — D631 Anemia in chronic kidney disease: Secondary | ICD-10-CM | POA: Diagnosis not present

## 2020-06-12 DIAGNOSIS — D509 Iron deficiency anemia, unspecified: Secondary | ICD-10-CM | POA: Diagnosis not present

## 2020-06-12 DIAGNOSIS — E1022 Type 1 diabetes mellitus with diabetic chronic kidney disease: Secondary | ICD-10-CM | POA: Diagnosis not present

## 2020-06-12 DIAGNOSIS — Z992 Dependence on renal dialysis: Secondary | ICD-10-CM | POA: Diagnosis not present

## 2020-06-12 MED ORDER — DOXYCYCLINE HYCLATE 100 MG PO TABS: 100.0000 mg | ORAL_TABLET | Freq: Two times a day (BID) | ORAL | 0 refills | Status: DC

## 2020-06-12 NOTE — Progress Notes (Signed)
Patient called stating he was suppose to receive an antibiotic and he needs to start it immediately. Spoke with Dr. March Rummage- culture was negative. Sent doxycyline per Dr. March Rummage. I attempted to call patient but went to his voicemail and it was full.

## 2020-06-12 NOTE — Telephone Encounter (Signed)
Patient call and stated that he was suppose to receive an antibiotic for his foot on the last visit. He was wondering if you could go ahead and send it for him due to his transportation

## 2020-06-13 DIAGNOSIS — I10 Essential (primary) hypertension: Secondary | ICD-10-CM | POA: Diagnosis not present

## 2020-06-13 NOTE — Telephone Encounter (Signed)
Dr. Jacqualyn Posey sent antibiotic yesterday

## 2020-06-14 DIAGNOSIS — D631 Anemia in chronic kidney disease: Secondary | ICD-10-CM | POA: Diagnosis not present

## 2020-06-14 DIAGNOSIS — Z992 Dependence on renal dialysis: Secondary | ICD-10-CM | POA: Diagnosis not present

## 2020-06-14 DIAGNOSIS — E1022 Type 1 diabetes mellitus with diabetic chronic kidney disease: Secondary | ICD-10-CM | POA: Diagnosis not present

## 2020-06-14 DIAGNOSIS — D688 Other specified coagulation defects: Secondary | ICD-10-CM | POA: Diagnosis not present

## 2020-06-14 DIAGNOSIS — N186 End stage renal disease: Secondary | ICD-10-CM | POA: Diagnosis not present

## 2020-06-14 DIAGNOSIS — A499 Bacterial infection, unspecified: Secondary | ICD-10-CM | POA: Diagnosis not present

## 2020-06-14 DIAGNOSIS — I10 Essential (primary) hypertension: Secondary | ICD-10-CM | POA: Diagnosis not present

## 2020-06-14 DIAGNOSIS — T8249XA Other complication of vascular dialysis catheter, initial encounter: Secondary | ICD-10-CM | POA: Diagnosis not present

## 2020-06-14 DIAGNOSIS — N2581 Secondary hyperparathyroidism of renal origin: Secondary | ICD-10-CM | POA: Diagnosis not present

## 2020-06-14 DIAGNOSIS — D509 Iron deficiency anemia, unspecified: Secondary | ICD-10-CM | POA: Diagnosis not present

## 2020-06-14 LAB — ANAEROBIC AND AEROBIC CULTURE
AER RESULT:: NO GROWTH
GRAM STAIN:: NONE SEEN
MICRO NUMBER:: 10935536
MICRO NUMBER:: 10935537
SPECIMEN QUALITY:: ADEQUATE
SPECIMEN QUALITY:: ADEQUATE

## 2020-06-15 DIAGNOSIS — I10 Essential (primary) hypertension: Secondary | ICD-10-CM | POA: Diagnosis not present

## 2020-06-16 DIAGNOSIS — I10 Essential (primary) hypertension: Secondary | ICD-10-CM | POA: Diagnosis not present

## 2020-06-17 DIAGNOSIS — I10 Essential (primary) hypertension: Secondary | ICD-10-CM | POA: Diagnosis not present

## 2020-06-18 DIAGNOSIS — I10 Essential (primary) hypertension: Secondary | ICD-10-CM | POA: Diagnosis not present

## 2020-06-19 ENCOUNTER — Other Ambulatory Visit: Payer: Self-pay

## 2020-06-19 DIAGNOSIS — I10 Essential (primary) hypertension: Secondary | ICD-10-CM | POA: Diagnosis not present

## 2020-06-19 DIAGNOSIS — N186 End stage renal disease: Secondary | ICD-10-CM

## 2020-06-20 DIAGNOSIS — I10 Essential (primary) hypertension: Secondary | ICD-10-CM | POA: Diagnosis not present

## 2020-06-21 DIAGNOSIS — Z992 Dependence on renal dialysis: Secondary | ICD-10-CM | POA: Diagnosis not present

## 2020-06-21 DIAGNOSIS — A499 Bacterial infection, unspecified: Secondary | ICD-10-CM | POA: Diagnosis not present

## 2020-06-21 DIAGNOSIS — I10 Essential (primary) hypertension: Secondary | ICD-10-CM | POA: Diagnosis not present

## 2020-06-21 DIAGNOSIS — D688 Other specified coagulation defects: Secondary | ICD-10-CM | POA: Diagnosis not present

## 2020-06-21 DIAGNOSIS — D509 Iron deficiency anemia, unspecified: Secondary | ICD-10-CM | POA: Diagnosis not present

## 2020-06-21 DIAGNOSIS — N186 End stage renal disease: Secondary | ICD-10-CM | POA: Diagnosis not present

## 2020-06-21 DIAGNOSIS — E1022 Type 1 diabetes mellitus with diabetic chronic kidney disease: Secondary | ICD-10-CM | POA: Diagnosis not present

## 2020-06-21 DIAGNOSIS — T8249XA Other complication of vascular dialysis catheter, initial encounter: Secondary | ICD-10-CM | POA: Diagnosis not present

## 2020-06-21 DIAGNOSIS — N2581 Secondary hyperparathyroidism of renal origin: Secondary | ICD-10-CM | POA: Diagnosis not present

## 2020-06-21 DIAGNOSIS — D631 Anemia in chronic kidney disease: Secondary | ICD-10-CM | POA: Diagnosis not present

## 2020-06-22 DIAGNOSIS — I10 Essential (primary) hypertension: Secondary | ICD-10-CM | POA: Diagnosis not present

## 2020-06-23 DIAGNOSIS — I10 Essential (primary) hypertension: Secondary | ICD-10-CM | POA: Diagnosis not present

## 2020-06-24 DIAGNOSIS — E1022 Type 1 diabetes mellitus with diabetic chronic kidney disease: Secondary | ICD-10-CM | POA: Diagnosis not present

## 2020-06-24 DIAGNOSIS — N186 End stage renal disease: Secondary | ICD-10-CM | POA: Diagnosis not present

## 2020-06-24 DIAGNOSIS — D509 Iron deficiency anemia, unspecified: Secondary | ICD-10-CM | POA: Diagnosis not present

## 2020-06-24 DIAGNOSIS — T8249XA Other complication of vascular dialysis catheter, initial encounter: Secondary | ICD-10-CM | POA: Diagnosis not present

## 2020-06-24 DIAGNOSIS — N2581 Secondary hyperparathyroidism of renal origin: Secondary | ICD-10-CM | POA: Diagnosis not present

## 2020-06-24 DIAGNOSIS — Z992 Dependence on renal dialysis: Secondary | ICD-10-CM | POA: Diagnosis not present

## 2020-06-24 DIAGNOSIS — D688 Other specified coagulation defects: Secondary | ICD-10-CM | POA: Diagnosis not present

## 2020-06-24 DIAGNOSIS — A499 Bacterial infection, unspecified: Secondary | ICD-10-CM | POA: Diagnosis not present

## 2020-06-24 DIAGNOSIS — D631 Anemia in chronic kidney disease: Secondary | ICD-10-CM | POA: Diagnosis not present

## 2020-06-24 DIAGNOSIS — I10 Essential (primary) hypertension: Secondary | ICD-10-CM | POA: Diagnosis not present

## 2020-06-25 ENCOUNTER — Other Ambulatory Visit: Payer: Self-pay

## 2020-06-25 ENCOUNTER — Ambulatory Visit (INDEPENDENT_AMBULATORY_CARE_PROVIDER_SITE_OTHER): Payer: Medicare HMO | Admitting: Podiatry

## 2020-06-25 ENCOUNTER — Telehealth: Payer: Self-pay

## 2020-06-25 DIAGNOSIS — I10 Essential (primary) hypertension: Secondary | ICD-10-CM | POA: Diagnosis not present

## 2020-06-25 DIAGNOSIS — M14672 Charcot's joint, left ankle and foot: Secondary | ICD-10-CM

## 2020-06-25 DIAGNOSIS — M86672 Other chronic osteomyelitis, left ankle and foot: Secondary | ICD-10-CM | POA: Diagnosis not present

## 2020-06-25 DIAGNOSIS — L97524 Non-pressure chronic ulcer of other part of left foot with necrosis of bone: Secondary | ICD-10-CM

## 2020-06-25 DIAGNOSIS — M8580 Other specified disorders of bone density and structure, unspecified site: Secondary | ICD-10-CM

## 2020-06-25 NOTE — Telephone Encounter (Signed)
Tab from [facility] called and stated that they have received the order but they are missing information. They are missing the start date, the drainage amount from the wound and also they want top verity if the wound had been debrided. Please fax this information to [phone].

## 2020-06-25 NOTE — Progress Notes (Signed)
  Subjective:  Patient ID: [name], male    DOB: Mar 10, 1981,  MRN: [id number]  Chief Complaint  Patient presents with  . Wound Check    "I have an infection in my foot and my catheter"   39 y.o. male presents for wound care. Wound is very dirty today with caked on dirt and debris.  Objective:  Physical Exam: Wound Location: left submet 5 Wound Measurement: 4.2x4 post-debridement Wound Base: Granular with exposed bone Peri-wound: Calloused Exudate: Moderate amount Serosanguinous exudate wound without warmth, erythema, signs of acute infection Wound with significant hyperkeratotic rim.   Assessment:   1. Ulcer of left foot, with necrosis of bone ([place])   2. Chronic osteomyelitis involving left ankle and foot ([place])   3. Bone erosion   4. Charcot's joint of foot, left    Plan:  Patient was evaluated and treated and all questions answered.  Ulcer left midfoot -Wound continues to worsen -He still has not gotten his MRI. I advised him to call to get the appt rescheduled. Number provided to patient. -Still has not gotten wound care supplies. New supplies order and order sent to [facility]. Given supplies today. -Discussed the importance of keeping the foot clean. The wound is very dirty today. This was thoroughly scrubbed with chlorhexidine -New bone sample taken for microbiology. 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 -Refer to ID. -He no longer qualifies for grafts due to insurance change. -I discussed again that I am worried about the progress of the wound. We discussed that I am concerned this wound will not heal, will continue to worsen, and he is likely going to lose his foot. -His only hope for limb salvage is much better compliance, IV abx, and aggressive wound care. Patient is upset at prospect of losing his leg but I worry he is not motivated to participate in his own care. He has had [place] in the past, but he is not homebound. He also -Discussed with  patient trialing NPWT in order to heal wound. Patient was initially resistant but agreed to mNWPWT. SNAP VAC applied.  Procedure: Excisional Debridement of Wound Indication: Removal of non-viable soft tissue from the wound to promote healing.  Anesthesia: none Pre-Debridement Wound Measurements: 4 cm x 3.6  cm x 0.4 cm  Post-Debridement Wound Measurements: 4.3 cm x 4 cm x 0.4 cm  Type of Debridement: Sharp Excisional Tissue Removed: Non-viable soft tissue, bone. Instrumentation: 15 blade and tissue nipper Depth of Debridement: subcutaneous tissue. Technique: Sharp excisional debridement to bleeding, viable wound base.  Dressing: Dry, sterile, compression dressing. Disposition: Patient tolerated procedure well. Patient to return in 1 week for follow-up.  Procedure: Mechanical Wound VAC Application Location: left plantar midfoot Wound Measurement: 4.2 cm x 3.8 cm x 0.4 cm  Technique: Blue foam to wound base, followed by hydrocolloid 0-Ring, followed by hydrocolloid dressing. Plunger maximally depressed with with good seal noted. Reinforced with duoderm. Disposition: Patient tolerated procedure well.    Return in about 3 days (around 06/28/2020).

## 2020-06-26 DIAGNOSIS — N186 End stage renal disease: Secondary | ICD-10-CM | POA: Diagnosis not present

## 2020-06-26 DIAGNOSIS — N2581 Secondary hyperparathyroidism of renal origin: Secondary | ICD-10-CM | POA: Diagnosis not present

## 2020-06-26 DIAGNOSIS — E1022 Type 1 diabetes mellitus with diabetic chronic kidney disease: Secondary | ICD-10-CM | POA: Diagnosis not present

## 2020-06-26 DIAGNOSIS — A499 Bacterial infection, unspecified: Secondary | ICD-10-CM | POA: Diagnosis not present

## 2020-06-26 DIAGNOSIS — T8249XA Other complication of vascular dialysis catheter, initial encounter: Secondary | ICD-10-CM | POA: Diagnosis not present

## 2020-06-26 DIAGNOSIS — Z992 Dependence on renal dialysis: Secondary | ICD-10-CM | POA: Diagnosis not present

## 2020-06-26 DIAGNOSIS — D509 Iron deficiency anemia, unspecified: Secondary | ICD-10-CM | POA: Diagnosis not present

## 2020-06-26 DIAGNOSIS — D631 Anemia in chronic kidney disease: Secondary | ICD-10-CM | POA: Diagnosis not present

## 2020-06-26 DIAGNOSIS — I10 Essential (primary) hypertension: Secondary | ICD-10-CM | POA: Diagnosis not present

## 2020-06-26 DIAGNOSIS — D688 Other specified coagulation defects: Secondary | ICD-10-CM | POA: Diagnosis not present

## 2020-06-27 DIAGNOSIS — I129 Hypertensive chronic kidney disease with stage 1 through stage 4 chronic kidney disease, or unspecified chronic kidney disease: Secondary | ICD-10-CM | POA: Diagnosis not present

## 2020-06-27 DIAGNOSIS — Z992 Dependence on renal dialysis: Secondary | ICD-10-CM | POA: Diagnosis not present

## 2020-06-27 DIAGNOSIS — N186 End stage renal disease: Secondary | ICD-10-CM | POA: Diagnosis not present

## 2020-06-27 DIAGNOSIS — I10 Essential (primary) hypertension: Secondary | ICD-10-CM | POA: Diagnosis not present

## 2020-06-27 NOTE — Progress Notes (Signed)
  Subjective:  Patient ID: [name], male    DOB: [birth year],  MRN: [id number]  Chief Complaint  Patient presents with  . Wound Check    Pt states no concerns. Denies fever/nausea/vomiting/chills.   39 y.o. male presents for wound care.  Dressing dirty and disheveled.  Is wearing cam boot.  Objective:  Physical Exam: Wound Location: left submet 5 Wound Measurement: 3.8 x 4.2 post-debridement Wound Base: Granular but with exposed bone at lateral aspect Peri-wound: Calloused Exudate: Moderate amount Serosanguinous exudate wound without warmth, erythema, signs of acute infection Wound with significant hyperkeratotic rim.  Assessment:   1. Chronic osteomyelitis involving left ankle and foot ([place])   2. Ulcer of left foot, with necrosis of bone ([place])    Plan:  Patient was evaluated and treated and all questions answered.  Ulcer left midfoot -Unfortunately due to insurance change no longer able to apply graft. -Wound appears to have worsened.  Patient no showed last appointment. -There is visible bone in the wound bed today discussed this finding with patient and that this is a sign of a continued worsening.  After sterile skin prep treatment stimulation.  A sample of the bone was taken for both pathology and microbiology.  As the wound is not acutely infected today we will hold off antibiotics -Wound care supplies ordered for patient.  Dispensed wound care supplies to patient -The wound was debrided down to and including the bone with a tissue nipper  Procedure: Excisional Debridement of Wound Indication: Removal of non-viable soft tissue from the wound to promote healing.  Anesthesia: none Pre-Debridement Wound Measurements: 4 cm x 3.6 cm x 0.3 cm  Post-Debridement Wound Measurements: 4.2 cm x 3.8 cm x 0.3 cm  Type of Debridement: Sharp Excisional Tissue Removed: Non-viable soft tissue Instrumentation: 15 blade and tissue nipper Depth of Debridement: bone Technique: Sharp  excisional debridement to bleeding, viable wound base.  Dressing: Dry, sterile, compression dressing. Disposition: Patient tolerated procedure well. Patient to return in 1 week for follow-up.  Return in about 10 days (around 06/17/2020).

## 2020-06-27 NOTE — Telephone Encounter (Signed)
Wound was debrided 9/28. Moderate exudate. Start 06/25/20  Can we just call and update info?

## 2020-06-28 ENCOUNTER — Other Ambulatory Visit: Payer: Self-pay

## 2020-06-28 ENCOUNTER — Ambulatory Visit (INDEPENDENT_AMBULATORY_CARE_PROVIDER_SITE_OTHER): Payer: Medicare HMO | Admitting: Podiatry

## 2020-06-28 DIAGNOSIS — N186 End stage renal disease: Secondary | ICD-10-CM | POA: Diagnosis not present

## 2020-06-28 DIAGNOSIS — M14672 Charcot's joint, left ankle and foot: Secondary | ICD-10-CM | POA: Diagnosis not present

## 2020-06-28 DIAGNOSIS — M8580 Other specified disorders of bone density and structure, unspecified site: Secondary | ICD-10-CM

## 2020-06-28 DIAGNOSIS — M869 Osteomyelitis, unspecified: Secondary | ICD-10-CM | POA: Diagnosis not present

## 2020-06-28 DIAGNOSIS — L97524 Non-pressure chronic ulcer of other part of left foot with necrosis of bone: Secondary | ICD-10-CM

## 2020-06-28 DIAGNOSIS — E1022 Type 1 diabetes mellitus with diabetic chronic kidney disease: Secondary | ICD-10-CM | POA: Diagnosis not present

## 2020-06-28 DIAGNOSIS — I10 Essential (primary) hypertension: Secondary | ICD-10-CM | POA: Diagnosis not present

## 2020-06-28 DIAGNOSIS — M86672 Other chronic osteomyelitis, left ankle and foot: Secondary | ICD-10-CM | POA: Diagnosis not present

## 2020-06-28 DIAGNOSIS — R739 Hyperglycemia, unspecified: Secondary | ICD-10-CM | POA: Diagnosis not present

## 2020-06-28 DIAGNOSIS — D688 Other specified coagulation defects: Secondary | ICD-10-CM | POA: Diagnosis not present

## 2020-06-28 DIAGNOSIS — Z992 Dependence on renal dialysis: Secondary | ICD-10-CM | POA: Diagnosis not present

## 2020-06-28 DIAGNOSIS — D509 Iron deficiency anemia, unspecified: Secondary | ICD-10-CM | POA: Diagnosis not present

## 2020-06-28 DIAGNOSIS — T8249XA Other complication of vascular dialysis catheter, initial encounter: Secondary | ICD-10-CM | POA: Diagnosis not present

## 2020-06-28 DIAGNOSIS — E1142 Type 2 diabetes mellitus with diabetic polyneuropathy: Secondary | ICD-10-CM

## 2020-06-28 DIAGNOSIS — A499 Bacterial infection, unspecified: Secondary | ICD-10-CM | POA: Diagnosis not present

## 2020-06-28 DIAGNOSIS — N2581 Secondary hyperparathyroidism of renal origin: Secondary | ICD-10-CM | POA: Diagnosis not present

## 2020-06-28 LAB — WOUND CULTURE
MICRO NUMBER:: 11004670
SPECIMEN QUALITY:: ADEQUATE

## 2020-06-29 DIAGNOSIS — I10 Essential (primary) hypertension: Secondary | ICD-10-CM | POA: Diagnosis not present

## 2020-06-30 DIAGNOSIS — I10 Essential (primary) hypertension: Secondary | ICD-10-CM | POA: Diagnosis not present

## 2020-07-01 ENCOUNTER — Other Ambulatory Visit (INDEPENDENT_AMBULATORY_CARE_PROVIDER_SITE_OTHER): Payer: Self-pay

## 2020-07-01 ENCOUNTER — Telehealth: Payer: Self-pay | Admitting: Podiatry

## 2020-07-01 DIAGNOSIS — N186 End stage renal disease: Secondary | ICD-10-CM

## 2020-07-01 DIAGNOSIS — I10 Essential (primary) hypertension: Secondary | ICD-10-CM | POA: Diagnosis not present

## 2020-07-01 DIAGNOSIS — R739 Hyperglycemia, unspecified: Secondary | ICD-10-CM | POA: Diagnosis not present

## 2020-07-01 DIAGNOSIS — D688 Other specified coagulation defects: Secondary | ICD-10-CM | POA: Diagnosis not present

## 2020-07-01 DIAGNOSIS — E1022 Type 1 diabetes mellitus with diabetic chronic kidney disease: Secondary | ICD-10-CM | POA: Diagnosis not present

## 2020-07-01 DIAGNOSIS — T8249XA Other complication of vascular dialysis catheter, initial encounter: Secondary | ICD-10-CM | POA: Diagnosis not present

## 2020-07-01 DIAGNOSIS — M869 Osteomyelitis, unspecified: Secondary | ICD-10-CM | POA: Diagnosis not present

## 2020-07-01 DIAGNOSIS — D509 Iron deficiency anemia, unspecified: Secondary | ICD-10-CM | POA: Diagnosis not present

## 2020-07-01 DIAGNOSIS — N2581 Secondary hyperparathyroidism of renal origin: Secondary | ICD-10-CM | POA: Diagnosis not present

## 2020-07-01 DIAGNOSIS — A499 Bacterial infection, unspecified: Secondary | ICD-10-CM | POA: Diagnosis not present

## 2020-07-01 DIAGNOSIS — Z992 Dependence on renal dialysis: Secondary | ICD-10-CM | POA: Diagnosis not present

## 2020-07-01 MED ORDER — ONETOUCH VERIO VI STRP: ORAL_STRIP | 8 refills | Status: DC

## 2020-07-01 NOTE — Telephone Encounter (Signed)
Urgent Lab results from [facility]- Reference Number [id number]

## 2020-07-01 NOTE — Progress Notes (Signed)
  Subjective:  Patient ID: [name], male    DOB: 1980-11-07,  MRN: [id number]  No chief complaint on file.  39 y.o. male presents for wound care. Dressing very disheveled today, with malodor and drainage on the dressing. Snap VAC is missing the plunger today. States that the wound VAC caused issues and eventually stopped working. Patient did not call the office to ask for what to do and left the dressing intact.  Objective:  Physical Exam: Wound Location: left submet 5 Wound Measurement: 4.2x4 post-debridement Wound Base: Granular with exposed bone Peri-wound:  macerated Exudate: Moderate amount Serosanguinous exudate wound without warmth, erythema, signs of acute infection  Assessment:   1. Ulcer of left foot, with necrosis of bone ([place])   2. Chronic osteomyelitis involving left ankle and foot ([place])   3. Bone erosion   4. Charcot's joint of foot, left   5. Diabetic polyneuropathy associated with type 2 diabetes mellitus ([place])    Plan:  Patient was evaluated and treated and all questions answered.  Ulcer left midfoot -SNAP VAC removed. Appears that the track pad moved causing failure, likely secondary to excessive weightbearing. Wound macerated. Dressed with betadine WTD today. -Still has not gotten his MRI done. -Is pending ID appointment. Per patient he is on Vancomycin for his infection in his catheter. This should be sufficient given culture findings. Advised patient there was no additional benefit to taking his PO meds. -Dressing supplies given to patient. Collected insurance card so this could be faxed to [facility] so he can get further supplies. -Patient advised to dress the wound daily with collagen dressing and DSD. -Patient remains at high risk of limb loss secondary to poor compliance and severity of disease.  No follow-ups on file.

## 2020-07-01 NOTE — Telephone Encounter (Signed)
Results reviewed

## 2020-07-02 DIAGNOSIS — I10 Essential (primary) hypertension: Secondary | ICD-10-CM | POA: Diagnosis not present

## 2020-07-03 ENCOUNTER — Telehealth: Payer: Self-pay | Admitting: Podiatry

## 2020-07-03 DIAGNOSIS — I10 Essential (primary) hypertension: Secondary | ICD-10-CM | POA: Diagnosis not present

## 2020-07-03 NOTE — Telephone Encounter (Signed)
Zach from [facility] called stating that the top part of the order sheet was cut off. I have faxed clinicals at his request.

## 2020-07-04 ENCOUNTER — Inpatient Hospital Stay (HOSPITAL_COMMUNITY): Admission: RE | Admit: 2020-07-04 | Payer: Medicare HMO | Source: Ambulatory Visit

## 2020-07-04 ENCOUNTER — Encounter (HOSPITAL_COMMUNITY): Payer: Medicare HMO

## 2020-07-04 ENCOUNTER — Ambulatory Visit: Payer: Medicare HMO | Admitting: Vascular Surgery

## 2020-07-04 DIAGNOSIS — Z992 Dependence on renal dialysis: Secondary | ICD-10-CM | POA: Diagnosis not present

## 2020-07-04 DIAGNOSIS — T82898A Other specified complication of vascular prosthetic devices, implants and grafts, initial encounter: Secondary | ICD-10-CM | POA: Diagnosis not present

## 2020-07-04 DIAGNOSIS — I10 Essential (primary) hypertension: Secondary | ICD-10-CM | POA: Diagnosis not present

## 2020-07-04 DIAGNOSIS — N186 End stage renal disease: Secondary | ICD-10-CM | POA: Diagnosis not present

## 2020-07-04 DIAGNOSIS — L97521 Non-pressure chronic ulcer of other part of left foot limited to breakdown of skin: Secondary | ICD-10-CM | POA: Diagnosis not present

## 2020-07-05 DIAGNOSIS — M869 Osteomyelitis, unspecified: Secondary | ICD-10-CM | POA: Diagnosis not present

## 2020-07-05 DIAGNOSIS — E1022 Type 1 diabetes mellitus with diabetic chronic kidney disease: Secondary | ICD-10-CM | POA: Diagnosis not present

## 2020-07-05 DIAGNOSIS — N2581 Secondary hyperparathyroidism of renal origin: Secondary | ICD-10-CM | POA: Diagnosis not present

## 2020-07-05 DIAGNOSIS — R739 Hyperglycemia, unspecified: Secondary | ICD-10-CM | POA: Diagnosis not present

## 2020-07-05 DIAGNOSIS — Z992 Dependence on renal dialysis: Secondary | ICD-10-CM | POA: Diagnosis not present

## 2020-07-05 DIAGNOSIS — A499 Bacterial infection, unspecified: Secondary | ICD-10-CM | POA: Diagnosis not present

## 2020-07-05 DIAGNOSIS — I10 Essential (primary) hypertension: Secondary | ICD-10-CM | POA: Diagnosis not present

## 2020-07-05 DIAGNOSIS — D509 Iron deficiency anemia, unspecified: Secondary | ICD-10-CM | POA: Diagnosis not present

## 2020-07-05 DIAGNOSIS — T8249XA Other complication of vascular dialysis catheter, initial encounter: Secondary | ICD-10-CM | POA: Diagnosis not present

## 2020-07-05 DIAGNOSIS — N186 End stage renal disease: Secondary | ICD-10-CM | POA: Diagnosis not present

## 2020-07-05 DIAGNOSIS — D688 Other specified coagulation defects: Secondary | ICD-10-CM | POA: Diagnosis not present

## 2020-07-06 DIAGNOSIS — I10 Essential (primary) hypertension: Secondary | ICD-10-CM | POA: Diagnosis not present

## 2020-07-07 DIAGNOSIS — I10 Essential (primary) hypertension: Secondary | ICD-10-CM | POA: Diagnosis not present

## 2020-07-08 DIAGNOSIS — I10 Essential (primary) hypertension: Secondary | ICD-10-CM | POA: Diagnosis not present

## 2020-07-08 DIAGNOSIS — D509 Iron deficiency anemia, unspecified: Secondary | ICD-10-CM | POA: Diagnosis not present

## 2020-07-08 DIAGNOSIS — R739 Hyperglycemia, unspecified: Secondary | ICD-10-CM | POA: Diagnosis not present

## 2020-07-08 DIAGNOSIS — N186 End stage renal disease: Secondary | ICD-10-CM | POA: Diagnosis not present

## 2020-07-08 DIAGNOSIS — A499 Bacterial infection, unspecified: Secondary | ICD-10-CM | POA: Diagnosis not present

## 2020-07-08 DIAGNOSIS — Z992 Dependence on renal dialysis: Secondary | ICD-10-CM | POA: Diagnosis not present

## 2020-07-08 DIAGNOSIS — T8249XA Other complication of vascular dialysis catheter, initial encounter: Secondary | ICD-10-CM | POA: Diagnosis not present

## 2020-07-08 DIAGNOSIS — N2581 Secondary hyperparathyroidism of renal origin: Secondary | ICD-10-CM | POA: Diagnosis not present

## 2020-07-08 DIAGNOSIS — M869 Osteomyelitis, unspecified: Secondary | ICD-10-CM | POA: Diagnosis not present

## 2020-07-08 DIAGNOSIS — E1022 Type 1 diabetes mellitus with diabetic chronic kidney disease: Secondary | ICD-10-CM | POA: Diagnosis not present

## 2020-07-08 DIAGNOSIS — D688 Other specified coagulation defects: Secondary | ICD-10-CM | POA: Diagnosis not present

## 2020-07-09 ENCOUNTER — Other Ambulatory Visit: Payer: Self-pay

## 2020-07-09 ENCOUNTER — Ambulatory Visit (INDEPENDENT_AMBULATORY_CARE_PROVIDER_SITE_OTHER): Payer: Medicare HMO | Admitting: Internal Medicine

## 2020-07-09 ENCOUNTER — Encounter: Payer: Self-pay | Admitting: Internal Medicine

## 2020-07-09 ENCOUNTER — Telehealth: Payer: Self-pay

## 2020-07-09 ENCOUNTER — Telehealth: Payer: Self-pay | Admitting: *Deleted

## 2020-07-09 VITALS — BP 141/103 | HR 91 | Temp 98.0°F | Wt 148.0 lb

## 2020-07-09 DIAGNOSIS — N186 End stage renal disease: Secondary | ICD-10-CM | POA: Diagnosis not present

## 2020-07-09 DIAGNOSIS — L97509 Non-pressure chronic ulcer of other part of unspecified foot with unspecified severity: Secondary | ICD-10-CM | POA: Diagnosis not present

## 2020-07-09 DIAGNOSIS — E11628 Type 2 diabetes mellitus with other skin complications: Secondary | ICD-10-CM | POA: Diagnosis not present

## 2020-07-09 DIAGNOSIS — E11621 Type 2 diabetes mellitus with foot ulcer: Secondary | ICD-10-CM | POA: Diagnosis not present

## 2020-07-09 DIAGNOSIS — Z992 Dependence on renal dialysis: Secondary | ICD-10-CM

## 2020-07-09 DIAGNOSIS — I10 Essential (primary) hypertension: Secondary | ICD-10-CM | POA: Diagnosis not present

## 2020-07-09 DIAGNOSIS — M86672 Other chronic osteomyelitis, left ankle and foot: Secondary | ICD-10-CM | POA: Diagnosis not present

## 2020-07-09 DIAGNOSIS — M86172 Other acute osteomyelitis, left ankle and foot: Secondary | ICD-10-CM | POA: Diagnosis not present

## 2020-07-09 DIAGNOSIS — L089 Local infection of the skin and subcutaneous tissue, unspecified: Secondary | ICD-10-CM

## 2020-07-09 NOTE — Patient Instructions (Signed)
Please continue to follow up with Dr March Rummage for your wound care.  I will send him my notes from today's visit regarding the vancomycin.

## 2020-07-09 NOTE — Assessment & Plan Note (Addendum)
Patient has longstanding issues with chronic wounds and infection involving bilateral feet and has undergone prior partial amputations.  He has been dealing with this wound for some time and now has positive bone cultures with E faecalis.  In Summer of 2020 he was also treated for 6 weeks with vancomycin for infection involving this foot.  Amputation has previously been recommended and discussed however patient is not interested in that option at present.  He reports he has been on vancomycin now for about 2 weeks and thinks there has been improvement in his foot drainage.  He continues to take doxycycline PO. On exam, his foot does not look grossly infected however recent cultures from podiatry clinic of bone have grown Enterococcus faecalis.  Plan: -- discussed with patient that antibiotic therapy alone will be very unlikely to adequately treat this infection given his issues with blood flow and wound healing.  In the future, amputation seems likely to be inevitable, however, patient remains uninterested in this option -- he reports being on vancomycin after HD for about 2 weeks now with subjective improvement.  His cultures are covered by this currently and he does not need PO doxycycline at this time which his podiatrist also conveyed to him on 06/28/20. -- stressed the importance of continued podiatry and wound care follow up.  It is reasonable to provide a course of vancomycin with HD for 6 weeks total to treat this current infection, however, further antibiotic treatment after this will likely not be of benefit -- we will follow his vancomycin orders.  Per his HD clinic was started on 06/28/20.  Planned EOT 08/08/20. -- MRI pending

## 2020-07-09 NOTE — Progress Notes (Signed)
[facility]  Chief Complaint:  Left foot wound  Subjective: [name] is a 39 y.o. male with PMHx as below who presents to the clinic for left foot wound.  He has previously been seen as an inpatient on the ID service. Most recently in Summer 2020 for left foot OM treated for 6 weeks with vancomycin. Please see A&P for the details of today's visit and status of the patient's medical problems.   Past Medical History:  Diagnosis Date  . Diabetes mellitus without complication ([place])   . Diabetic gastroparesis ([place])   . Dialysis patient ([place])   . Hypertension   . Renal disorder    Dialysis  . Sepsis [facility])     Outpatient Encounter Medications as of 07/09/2020  Medication Sig Note  . albuterol (VENTOLIN HFA) 108 (90 Base) MCG/ACT inhaler Inhale 2 puffs into the lungs every 6 (six) hours as needed for wheezing or shortness of breath.   . Blood Glucose Monitoring Suppl (ONETOUCH VERIO FLEX SYSTEM) w/Device KIT 1 Bag by Does not apply route 3 (three) times daily before meals.   . calcitRIOL (ROCALTROL) 0.5 MCG capsule Take 5 capsules (2.5 mcg total) by mouth every Monday, Wednesday, and Friday with hemodialysis.   Marland Kitchen cinacalcet (SENSIPAR) 60 MG tablet SMARTSIG:2 Tablet(s) By Mouth Every Evening   . dextrose 50 % solution Dextrose 50%   . glucose blood (ONETOUCH VERIO) test strip Check blood sugars three times daily   . insulin glargine (LANTUS SOLOSTAR) 100 UNIT/ML Solostar Pen Inject 12 Units into the skin daily. 02/14/2020: Has not started  . Lancets (ONETOUCH DELICA PLUS HTDSKA76O) MISC Apply topically.   . Methoxy PEG-Epoetin Beta (MIRCERA IJ) Mircera   . mupirocin ointment (BACTROBAN) 2 % Apply to wound twice a day.   Marland Kitchen NOVOLOG 100 UNIT/ML injection INJECT 6 UNITS SUBCUTANEOUSLY TWICE DAILY WITH A MEAL   . oxyCODONE (OXY IR/ROXICODONE) 5 MG immediate release tablet Take 5 mg by mouth 2 (two) times daily as needed.   . sevelamer carbonate (RENVELA)  800 MG tablet Take 2,400-4,000 mg by mouth See admin instructions. Take 5 tablets with meals then take 3 tablets with snacks   . [DISCONTINUED] doxycycline (VIBRA-TABS) 100 MG tablet Take 100 mg by mouth 2 (two) times daily.   . [DISCONTINUED] doxycycline (VIBRA-TABS) 100 MG tablet Take 1 tablet (100 mg total) by mouth 2 (two) times daily.    No facility-administered encounter medications on file as of 07/09/2020.    Family History  Problem Relation Age of Onset  . Diabetes Mellitus II Other   . Diabetes Father   . Renal Disease Father        ESRD    Social History   Socioeconomic History  . Marital status: Single    Spouse name: Not on file  . Number of children: Not on file  . Years of education: Not on file  . Highest education level: Not on file  Occupational History  . Not on file  Tobacco Use  . Smoking status: Never Smoker  . Smokeless tobacco: Never Used  Vaping Use  . Vaping Use: Never used  Substance and Sexual Activity  . Alcohol use: No  . Drug use: No  . Sexual activity: Never  Other Topics Concern  . Not on file  Social History Narrative  . Not on file   Social Determinants of Health   Financial Resource Strain:   . Difficulty of Paying  Living Expenses: Not on file  Food Insecurity:   . Worried About Charity fundraiser in the Last Year: Not on file  . Ran Out of Food in the Last Year: Not on file  Transportation Needs:   . Lack of Transportation (Medical): Not on file  . Lack of Transportation (Non-Medical): Not on file  Physical Activity:   . Days of Exercise per Week: Not on file  . Minutes of Exercise per Session: Not on file  Stress:   . Feeling of Stress : Not on file  Social Connections:   . Frequency of Communication with Friends and Family: Not on file  . Frequency of Social Gatherings with Friends and Family: Not on file  . Attends Religious Services: Not on file  . Active Member of Clubs or Organizations: Not on file  . Attends English as a second language teacher Meetings: Not on file  . Marital Status: Not on file  Intimate Partner Violence:   . Fear of Current or Ex-Partner: Not on file  . Emotionally Abused: Not on file  . Physically Abused: Not on file  . Sexually Abused: Not on file    No Known Allergies   Review of Systems: Review of Systems  Constitutional: Negative for chills and fever.  Respiratory: Negative.   Cardiovascular: Negative.   Gastrointestinal: Negative.      All others negative.  Objective: Vitals:   07/09/20 1013  BP: (!) 141/103  Pulse: 91  Temp: 98 F (36.7 C)  TempSrc: Oral  Weight: 148 lb (67.1 kg)   Body mass index is 23.18 kg/m.  Physical Exam Constitutional:      General: He is not in acute distress.    Appearance: Normal appearance.  Pulmonary:     Effort: Pulmonary effort is normal. No respiratory distress.  Skin:    General: Skin is warm and dry.     Comments: He has a large left submetatarsal wound. About 4x4cm with granulation tissue with exposed bone.  The area the wound is dried skin and somewhat unclean. There is some serosanguinous drainage without warmth, erythema, signs of acute infection. He has diminished sensation.   Neurological:     Mental Status: He is alert.       Pertinent Labs and Microbiology: CBC Latest Ref Rng & Units 05/30/2020 05/30/2020 02/16/2020  WBC 4.0 - 10.5 K/uL - 10.9(H) 5.2  Hemoglobin 13.0 - 17.0 g/dL 16.7 13.8 12.2(L)  Hematocrit 39 - 52 % 49.0 45.6 38.2(L)  Platelets 150 - 400 K/uL - 159 159   CMP Latest Ref Rng & Units 05/30/2020 05/30/2020 02/16/2020  Glucose 70 - 99 mg/dL 339(H) 351(H) 85  BUN 6 - 20 mg/dL 37(H) 34(H) 28(H)  Creatinine 0.61 - 1.24 mg/dL 9.30(H) 9.58(H) 8.81(H)  Sodium 135 - 145 mmol/L 130(L) 128(L) 131(L)  Potassium 3.5 - 5.1 mmol/L 4.6 4.7 4.1  Chloride 98 - 111 mmol/L 95(L) 91(L) 93(L)  CO2 22 - 32 mmol/L - 23 24  Calcium 8.9 - 10.3 mg/dL - 9.1 7.6(L)  Total Protein 6.5 - 8.1 g/dL - 7.5 6.0(L)  Total Bilirubin  0.3 - 1.2 mg/dL - 1.1 0.8  Alkaline Phos 38 - 126 U/L - 141(H) 83  AST 15 - 41 U/L - 26 27  ALT 0 - 44 U/L - 11 15    9/28 wound culture (Left plantar forefoot bone) Heavy growth E faecalis (amp and vanc sensitive)   Imaging: 9/2 foot x-ray IMPRESSION: Large ulcer along the plantar, lateral aspect of  the foot at the level of the residual fifth metatarsal base with findings concerning for osteomyelitis involving the residual base of the fifth metatarsal.   Assessment and Plan:  Acute osteomyelitis involving ankle and foot, left ([place]) Patient has longstanding issues with chronic wounds and infection involving bilateral feet and has undergone prior partial amputations.  He has been dealing with this wound for some time and now has positive bone cultures with E faecalis.  In Summer of 2020 he was also treated for 6 weeks with vancomycin for infection involving this foot.  Amputation has previously been recommended and discussed however patient is not interested in that option at present.  He reports he has been on vancomycin now for about 2 weeks and thinks there has been improvement in his foot drainage.  He continues to take doxycycline PO. On exam, his foot does not look grossly infected however recent cultures from podiatry clinic of bone have grown Enterococcus faecalis.  Plan: -- discussed with patient that antibiotic therapy alone will be very unlikely to adequately treat this infection given his issues with blood flow and wound healing.  In the future, amputation seems likely to be inevitable, however, patient remains uninterested in this option -- he reports being on vancomycin after HD for about 2 weeks now with subjective improvement.  His cultures are covered by this currently and he does not need PO doxycycline at this time which his podiatrist also conveyed to him on 06/28/20. -- stressed the importance of continued podiatry and wound care follow up.  It is reasonable to provide a  course of vancomycin with HD for 6 weeks total to treat this current infection, however, further antibiotic treatment after this will likely not be of benefit -- we will follow his vancomycin orders.  Per his HD clinic was started on 06/28/20.  Planned EOT 08/08/20. -- MRI pending     I spent greater than 40 minutes with the patient including greater than 50% of time in face to face counsel of the patient and in coordination of their care.   Raynelle Highland for Infectious Disease [facility] 07/09/2020, 11:36 AM

## 2020-07-09 NOTE — Telephone Encounter (Signed)
[facility] is requesting the patient's Dialysis Center contact information and when he was prescribed Vancomycin.please contact them [phone].

## 2020-07-09 NOTE — Telephone Encounter (Signed)
Can you call them and provide this information?

## 2020-07-09 NOTE — Telephone Encounter (Signed)
Per Dr. Juleen China called HD regarding patient's vancomycin. Will need to know when patient started Vanc and what provider is prescribing medication.  Called [facility] for information. Per HD [name], PA ordered vanc for patient. Initial dose started 10/1 with 6 constitutive doses ordered.

## 2020-07-09 NOTE — Telephone Encounter (Signed)
He sees Dr. Jimmy Footman and [name]. I believe their number is [phone]. I do not know when the vancomycin was started but they should be able to provide that information

## 2020-07-10 ENCOUNTER — Telehealth: Payer: Self-pay | Admitting: *Deleted

## 2020-07-10 DIAGNOSIS — E1022 Type 1 diabetes mellitus with diabetic chronic kidney disease: Secondary | ICD-10-CM | POA: Diagnosis not present

## 2020-07-10 DIAGNOSIS — N2581 Secondary hyperparathyroidism of renal origin: Secondary | ICD-10-CM | POA: Diagnosis not present

## 2020-07-10 DIAGNOSIS — D509 Iron deficiency anemia, unspecified: Secondary | ICD-10-CM | POA: Diagnosis not present

## 2020-07-10 DIAGNOSIS — D688 Other specified coagulation defects: Secondary | ICD-10-CM | POA: Diagnosis not present

## 2020-07-10 DIAGNOSIS — I10 Essential (primary) hypertension: Secondary | ICD-10-CM | POA: Diagnosis not present

## 2020-07-10 DIAGNOSIS — A499 Bacterial infection, unspecified: Secondary | ICD-10-CM | POA: Diagnosis not present

## 2020-07-10 DIAGNOSIS — Z992 Dependence on renal dialysis: Secondary | ICD-10-CM | POA: Diagnosis not present

## 2020-07-10 DIAGNOSIS — M869 Osteomyelitis, unspecified: Secondary | ICD-10-CM | POA: Diagnosis not present

## 2020-07-10 DIAGNOSIS — T8249XA Other complication of vascular dialysis catheter, initial encounter: Secondary | ICD-10-CM | POA: Diagnosis not present

## 2020-07-10 DIAGNOSIS — R739 Hyperglycemia, unspecified: Secondary | ICD-10-CM | POA: Diagnosis not present

## 2020-07-10 DIAGNOSIS — N186 End stage renal disease: Secondary | ICD-10-CM | POA: Diagnosis not present

## 2020-07-10 NOTE — Telephone Encounter (Signed)
Received message from [name].  Dr [name] would like to speak with Dr Juleen China, re: restarting vancomycin. Call back is at office: [phone].   Landis Gandy, RN

## 2020-07-10 NOTE — Telephone Encounter (Signed)
I spoke to Dr Jimmy Footman.  He just needed confirmation of what we were continuing vancomycin for.  Told him it was for foot osteomyelitis and duration would be 6 weeks from 10/1.  Can you make sure they just have an End of therapy as 08/08/20?   Thanks, Mitzi Hansen

## 2020-07-11 DIAGNOSIS — I10 Essential (primary) hypertension: Secondary | ICD-10-CM | POA: Diagnosis not present

## 2020-07-12 DIAGNOSIS — T8249XA Other complication of vascular dialysis catheter, initial encounter: Secondary | ICD-10-CM | POA: Diagnosis not present

## 2020-07-12 DIAGNOSIS — A499 Bacterial infection, unspecified: Secondary | ICD-10-CM | POA: Diagnosis not present

## 2020-07-12 DIAGNOSIS — Z992 Dependence on renal dialysis: Secondary | ICD-10-CM | POA: Diagnosis not present

## 2020-07-12 DIAGNOSIS — N186 End stage renal disease: Secondary | ICD-10-CM | POA: Diagnosis not present

## 2020-07-12 DIAGNOSIS — E1022 Type 1 diabetes mellitus with diabetic chronic kidney disease: Secondary | ICD-10-CM | POA: Diagnosis not present

## 2020-07-12 DIAGNOSIS — D688 Other specified coagulation defects: Secondary | ICD-10-CM | POA: Diagnosis not present

## 2020-07-12 DIAGNOSIS — D509 Iron deficiency anemia, unspecified: Secondary | ICD-10-CM | POA: Diagnosis not present

## 2020-07-12 DIAGNOSIS — I10 Essential (primary) hypertension: Secondary | ICD-10-CM | POA: Diagnosis not present

## 2020-07-12 DIAGNOSIS — M869 Osteomyelitis, unspecified: Secondary | ICD-10-CM | POA: Diagnosis not present

## 2020-07-12 DIAGNOSIS — R739 Hyperglycemia, unspecified: Secondary | ICD-10-CM | POA: Diagnosis not present

## 2020-07-12 DIAGNOSIS — N2581 Secondary hyperparathyroidism of renal origin: Secondary | ICD-10-CM | POA: Diagnosis not present

## 2020-07-12 NOTE — Telephone Encounter (Signed)
Per Cece, that is correct - (end date based on his dialysis days at the center).

## 2020-07-13 DIAGNOSIS — I10 Essential (primary) hypertension: Secondary | ICD-10-CM | POA: Diagnosis not present

## 2020-07-14 DIAGNOSIS — I10 Essential (primary) hypertension: Secondary | ICD-10-CM | POA: Diagnosis not present

## 2020-07-15 DIAGNOSIS — E1022 Type 1 diabetes mellitus with diabetic chronic kidney disease: Secondary | ICD-10-CM | POA: Diagnosis not present

## 2020-07-15 DIAGNOSIS — M869 Osteomyelitis, unspecified: Secondary | ICD-10-CM | POA: Diagnosis not present

## 2020-07-15 DIAGNOSIS — D509 Iron deficiency anemia, unspecified: Secondary | ICD-10-CM | POA: Diagnosis not present

## 2020-07-15 DIAGNOSIS — T8249XA Other complication of vascular dialysis catheter, initial encounter: Secondary | ICD-10-CM | POA: Diagnosis not present

## 2020-07-15 DIAGNOSIS — N186 End stage renal disease: Secondary | ICD-10-CM | POA: Diagnosis not present

## 2020-07-15 DIAGNOSIS — R739 Hyperglycemia, unspecified: Secondary | ICD-10-CM | POA: Diagnosis not present

## 2020-07-15 DIAGNOSIS — A499 Bacterial infection, unspecified: Secondary | ICD-10-CM | POA: Diagnosis not present

## 2020-07-15 DIAGNOSIS — N2581 Secondary hyperparathyroidism of renal origin: Secondary | ICD-10-CM | POA: Diagnosis not present

## 2020-07-15 DIAGNOSIS — I10 Essential (primary) hypertension: Secondary | ICD-10-CM | POA: Diagnosis not present

## 2020-07-15 DIAGNOSIS — D688 Other specified coagulation defects: Secondary | ICD-10-CM | POA: Diagnosis not present

## 2020-07-15 DIAGNOSIS — Z992 Dependence on renal dialysis: Secondary | ICD-10-CM | POA: Diagnosis not present

## 2020-07-16 DIAGNOSIS — I10 Essential (primary) hypertension: Secondary | ICD-10-CM | POA: Diagnosis not present

## 2020-07-17 DIAGNOSIS — D509 Iron deficiency anemia, unspecified: Secondary | ICD-10-CM | POA: Diagnosis not present

## 2020-07-17 DIAGNOSIS — N2581 Secondary hyperparathyroidism of renal origin: Secondary | ICD-10-CM | POA: Diagnosis not present

## 2020-07-17 DIAGNOSIS — R739 Hyperglycemia, unspecified: Secondary | ICD-10-CM | POA: Diagnosis not present

## 2020-07-17 DIAGNOSIS — Z992 Dependence on renal dialysis: Secondary | ICD-10-CM | POA: Diagnosis not present

## 2020-07-17 DIAGNOSIS — T8249XA Other complication of vascular dialysis catheter, initial encounter: Secondary | ICD-10-CM | POA: Diagnosis not present

## 2020-07-17 DIAGNOSIS — M869 Osteomyelitis, unspecified: Secondary | ICD-10-CM | POA: Diagnosis not present

## 2020-07-17 DIAGNOSIS — N186 End stage renal disease: Secondary | ICD-10-CM | POA: Diagnosis not present

## 2020-07-17 DIAGNOSIS — E1022 Type 1 diabetes mellitus with diabetic chronic kidney disease: Secondary | ICD-10-CM | POA: Diagnosis not present

## 2020-07-17 DIAGNOSIS — D688 Other specified coagulation defects: Secondary | ICD-10-CM | POA: Diagnosis not present

## 2020-07-17 DIAGNOSIS — A499 Bacterial infection, unspecified: Secondary | ICD-10-CM | POA: Diagnosis not present

## 2020-07-17 DIAGNOSIS — I10 Essential (primary) hypertension: Secondary | ICD-10-CM | POA: Diagnosis not present

## 2020-07-18 DIAGNOSIS — I10 Essential (primary) hypertension: Secondary | ICD-10-CM | POA: Diagnosis not present

## 2020-07-19 ENCOUNTER — Other Ambulatory Visit (INDEPENDENT_AMBULATORY_CARE_PROVIDER_SITE_OTHER): Payer: Self-pay | Admitting: Primary Care

## 2020-07-19 ENCOUNTER — Telehealth: Payer: Self-pay | Admitting: Primary Care

## 2020-07-19 ENCOUNTER — Telehealth (INDEPENDENT_AMBULATORY_CARE_PROVIDER_SITE_OTHER): Payer: Self-pay | Admitting: Primary Care

## 2020-07-19 DIAGNOSIS — E1022 Type 1 diabetes mellitus with diabetic chronic kidney disease: Secondary | ICD-10-CM | POA: Diagnosis not present

## 2020-07-19 DIAGNOSIS — M869 Osteomyelitis, unspecified: Secondary | ICD-10-CM | POA: Diagnosis not present

## 2020-07-19 DIAGNOSIS — I10 Essential (primary) hypertension: Secondary | ICD-10-CM | POA: Diagnosis not present

## 2020-07-19 DIAGNOSIS — T8249XA Other complication of vascular dialysis catheter, initial encounter: Secondary | ICD-10-CM | POA: Diagnosis not present

## 2020-07-19 DIAGNOSIS — E1059 Type 1 diabetes mellitus with other circulatory complications: Secondary | ICD-10-CM

## 2020-07-19 DIAGNOSIS — Z992 Dependence on renal dialysis: Secondary | ICD-10-CM | POA: Diagnosis not present

## 2020-07-19 DIAGNOSIS — D688 Other specified coagulation defects: Secondary | ICD-10-CM | POA: Diagnosis not present

## 2020-07-19 DIAGNOSIS — N186 End stage renal disease: Secondary | ICD-10-CM | POA: Diagnosis not present

## 2020-07-19 DIAGNOSIS — R739 Hyperglycemia, unspecified: Secondary | ICD-10-CM | POA: Diagnosis not present

## 2020-07-19 DIAGNOSIS — D509 Iron deficiency anemia, unspecified: Secondary | ICD-10-CM | POA: Diagnosis not present

## 2020-07-19 DIAGNOSIS — N2581 Secondary hyperparathyroidism of renal origin: Secondary | ICD-10-CM | POA: Diagnosis not present

## 2020-07-19 DIAGNOSIS — A499 Bacterial infection, unspecified: Secondary | ICD-10-CM | POA: Diagnosis not present

## 2020-07-19 MED ORDER — NOVOLOG 100 UNIT/ML ~~LOC~~ SOLN: SUBCUTANEOUS | 3 refills | Status: DC

## 2020-07-19 NOTE — Telephone Encounter (Signed)
Medication Refill - Medication: NOVOLOG 100 UNIT/ML injection   Pt called stating that anything over 12 units must be called in. (He needs 18 a day) He says to call pharmacy and ask for [name]. He is completely out of his insulin.   Has the patient contacted their pharmacy? Yes.   (Agent: If no, request that the patient contact the pharmacy for the refill.) (Agent: If yes, when and what did the pharmacy advise?)  Preferred Pharmacy (with phone number or street name):  [facility] ([place]), [place] - [address]  [address] [place] ([place]) [place] [address]  Phone: [phone] Fax: [phone]     Agent: Please be advised that RX refills may take up to 3 business days. We ask that you follow-up with your pharmacy.

## 2020-07-19 NOTE — Telephone Encounter (Signed)
Patient called back- he is very upset- he needs an increase in his dosing to 18 unit- and that was not done- he is requesting we call the pharmacy to change Rx- call to office they are going to contact PCP and see if this can be fixed today. Requested they call patient back and let him know.

## 2020-07-19 NOTE — Telephone Encounter (Signed)
Pharmacy calling stating that the pt has told them that he has had the dosage increased by PCP. Please advise .

## 2020-07-19 NOTE — Telephone Encounter (Signed)
Called patient to inform him that the provider was not going to send a new Rx or increase the dosage on his insulin. Patient became very upset and started cursing. Patient said "what am I suppose to do with out medication." Told patient that he needed to schedule an appointment to come and see his PCP. Patient staid " how am I suppose to know that I have to schedule an appointment to get my insulin." Advised patient to go to urgent care and he hanged up the phone.

## 2020-07-19 NOTE — Telephone Encounter (Signed)
Patient called and was upset that he had not received a phone call back by 5pm. Stated what was he supposed to do until tomorrow without insulin. I advised the patient that in order to get a new prescription at a new dosage that he would need an appointment. Patient asked if he could be seen tomorrow, I said "no we're closed" and he asked what was he supposed to do and I advised him to refill one of the 5 existing insulin re-fills tomorrow at the pharmacy. Patient stated that he would not go to the hospital due to his lack of insulin and was upset that this issue was not resolved before 5pm.

## 2020-07-19 NOTE — Telephone Encounter (Signed)
Requested Prescriptions  Pending Prescriptions Disp Refills  . NOVOLOG 100 UNIT/ML injection 10 mL 3    Sig: INJECT 6 UNITS SUBCUTANEOUSLY TWICE DAILY WITH A MEAL     Endocrinology:  Diabetes - Insulins Failed - 07/19/2020 12:48 PM      Failed - HBA1C is between 0 and 7.9 and within 180 days    Hemoglobin A1C  Date Value Ref Range Status  08/22/2014 5.8 4.2 - 6.3 % Final    Comment:    The American Diabetes Association recommends that a primary goal of therapy should be <7% and that physicians should reevaluate the treatment regimen in patients with HbA1c values consistently >8%.    Hgb A1c MFr Bld  Date Value Ref Range Status  01/31/2020 8.9 (H) 4.8 - 5.6 % Final    Comment:    (NOTE) Pre diabetes:          5.7%-6.4% Diabetes:              >6.4% Glycemic control for   <7.0% adults with diabetes          Passed - Valid encounter within last 6 months    Recent Outpatient Visits          5 months ago Type 1 diabetes mellitus with other circulatory complication ([place])   [facility] Kerin Perna, NP   1 year ago Need for Tdap vaccination   [facility] Kerin Perna, NP

## 2020-07-19 NOTE — Telephone Encounter (Signed)
Patient was called about an issue concerning his insulin. Patient states that he is completely out of insulin and his insurance will not pay for the existing re-fill until tomorrow, therefore he is requesting a new prescription written at a different dosage. I advised patient that with a dosage change he would most likely need an appointment and he requested a call back today before 5pm with his insulin fixed.

## 2020-07-20 DIAGNOSIS — I10 Essential (primary) hypertension: Secondary | ICD-10-CM | POA: Diagnosis not present

## 2020-07-21 DIAGNOSIS — I10 Essential (primary) hypertension: Secondary | ICD-10-CM | POA: Diagnosis not present

## 2020-07-22 ENCOUNTER — Inpatient Hospital Stay (HOSPITAL_COMMUNITY): Payer: Medicare HMO

## 2020-07-22 ENCOUNTER — Observation Stay (HOSPITAL_COMMUNITY)
Admission: EM | Admit: 2020-07-22 | Discharge: 2020-07-22 | Disposition: A | Discharge status: Home / Self Care | Payer: Medicare HMO | Attending: Internal Medicine | Admitting: Internal Medicine

## 2020-07-22 ENCOUNTER — Other Ambulatory Visit: Payer: Self-pay

## 2020-07-22 ENCOUNTER — Telehealth: Payer: Self-pay | Admitting: Podiatry

## 2020-07-22 DIAGNOSIS — E8770 Fluid overload, unspecified: Secondary | ICD-10-CM

## 2020-07-22 DIAGNOSIS — E1022 Type 1 diabetes mellitus with diabetic chronic kidney disease: Secondary | ICD-10-CM | POA: Diagnosis not present

## 2020-07-22 DIAGNOSIS — R0602 Shortness of breath: Secondary | ICD-10-CM | POA: Diagnosis not present

## 2020-07-22 DIAGNOSIS — Z794 Long term (current) use of insulin: Secondary | ICD-10-CM | POA: Diagnosis not present

## 2020-07-22 DIAGNOSIS — R001 Bradycardia, unspecified: Secondary | ICD-10-CM | POA: Diagnosis not present

## 2020-07-22 DIAGNOSIS — R000 Tachycardia, unspecified: Secondary | ICD-10-CM | POA: Diagnosis not present

## 2020-07-22 DIAGNOSIS — J90 Pleural effusion, not elsewhere classified: Secondary | ICD-10-CM | POA: Diagnosis not present

## 2020-07-22 DIAGNOSIS — L97509 Non-pressure chronic ulcer of other part of unspecified foot with unspecified severity: Secondary | ICD-10-CM | POA: Diagnosis present

## 2020-07-22 DIAGNOSIS — N186 End stage renal disease: Secondary | ICD-10-CM | POA: Diagnosis not present

## 2020-07-22 DIAGNOSIS — E118 Type 2 diabetes mellitus with unspecified complications: Secondary | ICD-10-CM | POA: Diagnosis present

## 2020-07-22 DIAGNOSIS — R231 Pallor: Secondary | ICD-10-CM | POA: Diagnosis not present

## 2020-07-22 DIAGNOSIS — I10 Essential (primary) hypertension: Secondary | ICD-10-CM | POA: Diagnosis not present

## 2020-07-22 DIAGNOSIS — E11621 Type 2 diabetes mellitus with foot ulcer: Secondary | ICD-10-CM | POA: Diagnosis present

## 2020-07-22 DIAGNOSIS — N250 Renal osteodystrophy: Secondary | ICD-10-CM | POA: Diagnosis not present

## 2020-07-22 DIAGNOSIS — R865 Abnormal microbiological findings in specimens from male genital organs: Secondary | ICD-10-CM | POA: Diagnosis not present

## 2020-07-22 DIAGNOSIS — E109 Type 1 diabetes mellitus without complications: Secondary | ICD-10-CM | POA: Diagnosis present

## 2020-07-22 DIAGNOSIS — Z992 Dependence on renal dialysis: Secondary | ICD-10-CM | POA: Diagnosis not present

## 2020-07-22 DIAGNOSIS — Z79899 Other long term (current) drug therapy: Secondary | ICD-10-CM | POA: Diagnosis not present

## 2020-07-22 DIAGNOSIS — E875 Hyperkalemia: Secondary | ICD-10-CM | POA: Diagnosis present

## 2020-07-22 DIAGNOSIS — R4182 Altered mental status, unspecified: Secondary | ICD-10-CM | POA: Insufficient documentation

## 2020-07-22 DIAGNOSIS — Z20822 Contact with and (suspected) exposure to covid-19: Secondary | ICD-10-CM | POA: Diagnosis not present

## 2020-07-22 DIAGNOSIS — I120 Hypertensive chronic kidney disease with stage 5 chronic kidney disease or end stage renal disease: Secondary | ICD-10-CM | POA: Insufficient documentation

## 2020-07-22 DIAGNOSIS — R531 Weakness: Secondary | ICD-10-CM | POA: Diagnosis not present

## 2020-07-22 DIAGNOSIS — E108 Type 1 diabetes mellitus with unspecified complications: Secondary | ICD-10-CM | POA: Diagnosis present

## 2020-07-22 DIAGNOSIS — R875 Abnormal microbiological findings in specimens from female genital organs: Secondary | ICD-10-CM | POA: Diagnosis not present

## 2020-07-22 DIAGNOSIS — I959 Hypotension, unspecified: Secondary | ICD-10-CM | POA: Diagnosis not present

## 2020-07-22 LAB — COMPREHENSIVE METABOLIC PANEL
ALT: 25 U/L (ref 0–44)
ALT: 29 U/L (ref 0–44)
ALT: 32 U/L (ref 0–44)
AST: 32 U/L (ref 15–41)
AST: 35 U/L (ref 15–41)
AST: 36 U/L (ref 15–41)
Albumin: 2.9 g/dL — ABNORMAL LOW (ref 3.5–5.0)
Albumin: 3 g/dL — ABNORMAL LOW (ref 3.5–5.0)
Albumin: 3.2 g/dL — ABNORMAL LOW (ref 3.5–5.0)
Alkaline Phosphatase: 151 U/L — ABNORMAL HIGH (ref 38–126)
Alkaline Phosphatase: 159 U/L — ABNORMAL HIGH (ref 38–126)
Alkaline Phosphatase: 177 U/L — ABNORMAL HIGH (ref 38–126)
Anion gap: 16 — ABNORMAL HIGH (ref 5–15)
Anion gap: 13 (ref 5–15)
Anion gap: 16 — ABNORMAL HIGH (ref 5–15)
BUN: 104 mg/dL — ABNORMAL HIGH (ref 6–20)
BUN: 74 mg/dL — ABNORMAL HIGH (ref 6–20)
BUN: 29 mg/dL — ABNORMAL HIGH (ref 6–20)
CO2: 12 mmol/L — ABNORMAL LOW (ref 22–32)
CO2: 20 mmol/L — ABNORMAL LOW (ref 22–32)
CO2: 24 mmol/L (ref 22–32)
Calcium: 9.1 mg/dL (ref 8.9–10.3)
Calcium: 10.1 mg/dL (ref 8.9–10.3)
Calcium: 8.7 mg/dL — ABNORMAL LOW (ref 8.9–10.3)
Chloride: 102 mmol/L (ref 98–111)
Chloride: 101 mmol/L (ref 98–111)
Chloride: 96 mmol/L — ABNORMAL LOW (ref 98–111)
Creatinine, Ser: 8.76 mg/dL — ABNORMAL HIGH (ref 0.61–1.24)
Creatinine, Ser: 6.65 mg/dL — ABNORMAL HIGH (ref 0.61–1.24)
Creatinine, Ser: 3.62 mg/dL — ABNORMAL HIGH (ref 0.61–1.24)
GFR, Estimated: 7 mL/min — ABNORMAL LOW (ref >60)
GFR, Estimated: 10 mL/min — ABNORMAL LOW (ref >60)
GFR, Estimated: 21 mL/min — ABNORMAL LOW (ref >60)
Glucose, Bld: 307 mg/dL — ABNORMAL HIGH (ref 70–99)
Glucose, Bld: 126 mg/dL — ABNORMAL HIGH (ref 70–99)
Glucose, Bld: 153 mg/dL — ABNORMAL HIGH (ref 70–99)
Potassium: >7.5 mmol/L (ref 3.5–5.1)
Potassium: 7 mmol/L (ref 3.5–5.1)
Potassium: 4.5 mmol/L (ref 3.5–5.1)
Sodium: 130 mmol/L — ABNORMAL LOW (ref 135–145)
Sodium: 134 mmol/L — ABNORMAL LOW (ref 135–145)
Sodium: 136 mmol/L (ref 135–145)
Total Bilirubin: 0.9 mg/dL (ref 0.3–1.2)
Total Bilirubin: 1.1 mg/dL (ref 0.3–1.2)
Total Bilirubin: 1.6 mg/dL — ABNORMAL HIGH (ref 0.3–1.2)
Total Protein: 6.8 g/dL (ref 6.5–8.1)
Total Protein: 7.1 g/dL (ref 6.5–8.1)
Total Protein: 7.4 g/dL (ref 6.5–8.1)

## 2020-07-22 LAB — HEPATITIS B SURFACE ANTIGEN: Hepatitis B Surface Ag: NONREACTIVE

## 2020-07-22 LAB — CBC WITH DIFFERENTIAL/PLATELET
Abs Immature Granulocytes: 0.09 10*3/uL — ABNORMAL HIGH (ref 0.00–0.07)
Basophils Absolute: 0.1 10*3/uL (ref 0.0–0.1)
Basophils Relative: 1 %
Eosinophils Absolute: 0.8 10*3/uL — ABNORMAL HIGH (ref 0.0–0.5)
Eosinophils Relative: 5 %
HCT: 38.5 % — ABNORMAL LOW (ref 39.0–52.0)
Hemoglobin: 11.6 g/dL — ABNORMAL LOW (ref 13.0–17.0)
Immature Granulocytes: 1 %
Lymphocytes Relative: 15 %
Lymphs Abs: 2.2 10*3/uL (ref 0.7–4.0)
MCH: 30.1 pg (ref 26.0–34.0)
MCHC: 30.1 g/dL (ref 30.0–36.0)
MCV: 100 fL (ref 80.0–100.0)
Monocytes Absolute: 0.9 10*3/uL (ref 0.1–1.0)
Monocytes Relative: 6 %
Neutro Abs: 10.3 10*3/uL — ABNORMAL HIGH (ref 1.7–7.7)
Neutrophils Relative %: 72 %
Platelets: 240 10*3/uL (ref 150–400)
RBC: 3.85 MIL/uL — ABNORMAL LOW (ref 4.22–5.81)
RDW: 15.7 % — ABNORMAL HIGH (ref 11.5–15.5)
WBC: 14.3 10*3/uL — ABNORMAL HIGH (ref 4.0–10.5)
nRBC: 0 % (ref 0.0–0.2)

## 2020-07-22 LAB — VANCOMYCIN, RANDOM: Vancomycin Rm: 12

## 2020-07-22 LAB — PROTIME-INR
INR: 1.2 (ref 0.8–1.2)
Prothrombin Time: 14.3 seconds (ref 11.4–15.2)

## 2020-07-22 LAB — GLUCOSE, CAPILLARY
Glucose-Capillary: 113 mg/dL — ABNORMAL HIGH (ref 70–99)
Glucose-Capillary: 141 mg/dL — ABNORMAL HIGH (ref 70–99)
Glucose-Capillary: 373 mg/dL — ABNORMAL HIGH (ref 70–99)
Glucose-Capillary: 219 mg/dL — ABNORMAL HIGH (ref 70–99)

## 2020-07-22 LAB — CBG MONITORING, ED: Glucose-Capillary: 295 mg/dL — ABNORMAL HIGH (ref 70–99)

## 2020-07-22 LAB — BLOOD GAS, VENOUS
Acid-base deficit: 4.4 mmol/L — ABNORMAL HIGH (ref 0.0–2.0)
Bicarbonate: 20.2 mmol/L (ref 20.0–28.0)
FIO2: 28
O2 Saturation: 82.3 %
Patient temperature: 36.7
pCO2, Ven: 36.7 mmHg — ABNORMAL LOW (ref 44.0–60.0)
pH, Ven: 7.357 (ref 7.250–7.430)
pO2, Ven: 50.9 mmHg — ABNORMAL HIGH (ref 32.0–45.0)

## 2020-07-22 LAB — HEPATITIS B CORE ANTIBODY, TOTAL: Hep B Core Total Ab: NONREACTIVE

## 2020-07-22 LAB — RESPIRATORY PANEL BY RT PCR (FLU A&B, COVID)
Influenza A by PCR: NEGATIVE
Influenza B by PCR: NEGATIVE
SARS Coronavirus 2 by RT PCR: NEGATIVE

## 2020-07-22 LAB — HIV ANTIBODY (ROUTINE TESTING W REFLEX): HIV Screen 4th Generation wRfx: NONREACTIVE

## 2020-07-22 LAB — PROCALCITONIN: Procalcitonin: 2.42 ng/mL

## 2020-07-22 MED ORDER — SODIUM CHLORIDE 0.9% FLUSH: 3.0000 mL | INTRAVENOUS | Status: DC | PRN

## 2020-07-22 MED ORDER — CALCIUM CHLORIDE 10 % IV SOLN
INTRAVENOUS | Status: DC | PRN
  Administered 2020-07-22 (×3): 2 g via INTRAVENOUS

## 2020-07-22 MED ORDER — INSULIN ASPART 100 UNIT/ML IV SOLN
5.0000 [IU] | Freq: Once | INTRAVENOUS | Status: AC
  Administered 2020-07-22: 5 [IU] via INTRAVENOUS

## 2020-07-22 MED ORDER — HYDROMORPHONE HCL 1 MG/ML IJ SOLN: 0.5000 mg | INTRAMUSCULAR | Status: DC | PRN

## 2020-07-22 MED ORDER — MORPHINE SULFATE (PF) 2 MG/ML IV SOLN: 2.0000 mg | INTRAVENOUS | Status: DC | PRN

## 2020-07-22 MED ORDER — INSULIN ASPART 100 UNIT/ML ~~LOC~~ SOLN
0.0000 [IU] | SUBCUTANEOUS | Status: DC
  Administered 2020-07-22: 5 [IU] via SUBCUTANEOUS

## 2020-07-22 MED ORDER — ACETAMINOPHEN 325 MG PO TABS: 650.0000 mg | ORAL_TABLET | ORAL | Status: DC | PRN

## 2020-07-22 MED ORDER — CHLORHEXIDINE GLUCONATE CLOTH 2 % EX PADS
6.0000 | MEDICATED_PAD | Freq: Every day | CUTANEOUS | Status: DC
  Administered 2020-07-22: 6 via TOPICAL

## 2020-07-22 MED ORDER — INSULIN ASPART 100 UNIT/ML ~~LOC~~ SOLN
5.0000 [IU] | Freq: Once | SUBCUTANEOUS | Status: AC
  Administered 2020-07-22: 5 [IU] via SUBCUTANEOUS

## 2020-07-22 MED ORDER — SODIUM CHLORIDE 0.9 % IV SOLN: 250.0000 mL | INTRAVENOUS | Status: DC | PRN

## 2020-07-22 MED ORDER — CALCITRIOL 0.5 MCG PO CAPS
3.0000 ug | ORAL_CAPSULE | ORAL | Status: DC
  Filled 2020-07-22: qty 6

## 2020-07-22 MED ORDER — ACETAMINOPHEN 325 MG PO TABS: 650.0000 mg | ORAL_TABLET | Freq: Four times a day (QID) | ORAL | Status: DC | PRN

## 2020-07-22 MED ORDER — VANCOMYCIN VARIABLE DOSE PER UNSTABLE RENAL FUNCTION (PHARMACIST DOSING): Status: DC

## 2020-07-22 MED ORDER — SODIUM BICARBONATE 8.4 % IV SOLN
INTRAVENOUS | Status: DC | PRN
  Administered 2020-07-22: 100 meq via INTRAVENOUS

## 2020-07-22 MED ORDER — VANCOMYCIN HCL 750 MG/150ML IV SOLN
750.0000 mg | INTRAVENOUS | Status: DC
  Administered 2020-07-22: 750 mg via INTRAVENOUS
  Filled 2020-07-22: qty 150

## 2020-07-22 MED ORDER — DOCUSATE SODIUM 100 MG PO CAPS: 100.0000 mg | ORAL_CAPSULE | Freq: Two times a day (BID) | ORAL | Status: DC | PRN

## 2020-07-22 MED ORDER — SEVELAMER CARBONATE 800 MG PO TABS: 800.0000 mg | ORAL_TABLET | Freq: Three times a day (TID) | ORAL | Status: DC

## 2020-07-22 MED ORDER — ALBUTEROL SULFATE (2.5 MG/3ML) 0.083% IN NEBU
5.0000 mg | INHALATION_SOLUTION | Freq: Once | RESPIRATORY_TRACT | Status: AC
  Administered 2020-07-22: 5 mg via RESPIRATORY_TRACT
  Filled 2020-07-22: qty 6

## 2020-07-22 MED ORDER — SEVELAMER CARBONATE 800 MG PO TABS
2400.0000 mg | ORAL_TABLET | Freq: Three times a day (TID) | ORAL | Status: DC
  Filled 2020-07-22: qty 3

## 2020-07-22 MED ORDER — ONDANSETRON HCL 4 MG/2ML IJ SOLN: 4.0000 mg | Freq: Four times a day (QID) | INTRAMUSCULAR | Status: DC | PRN

## 2020-07-22 MED ORDER — CINACALCET HCL 30 MG PO TABS
120.0000 mg | ORAL_TABLET | Freq: Every day | ORAL | Status: DC
  Administered 2020-07-22: 120 mg via ORAL
  Filled 2020-07-22: qty 4

## 2020-07-22 MED ORDER — HEPARIN SODIUM (PORCINE) 1000 UNIT/ML IJ SOLN
5000.0000 [IU] | Freq: Once | INTRAMUSCULAR | Status: AC
  Administered 2020-07-22: 5000 [IU] via INTRAVENOUS
  Filled 2020-07-22 (×2): qty 5

## 2020-07-22 MED ORDER — SODIUM CHLORIDE 0.9 % IV SOLN
1.0000 g | Freq: Once | INTRAVENOUS | Status: DC
  Filled 2020-07-22: qty 10

## 2020-07-22 MED ORDER — ACETAMINOPHEN 650 MG RE SUPP: 650.0000 mg | Freq: Four times a day (QID) | RECTAL | Status: DC | PRN

## 2020-07-22 MED ORDER — INSULIN GLARGINE 100 UNIT/ML ~~LOC~~ SOLN
12.0000 [IU] | Freq: Every day | SUBCUTANEOUS | Status: DC
  Administered 2020-07-22: 12 [IU] via SUBCUTANEOUS
  Filled 2020-07-22: qty 0.12

## 2020-07-22 MED ORDER — CALCITRIOL 0.5 MCG PO CAPS
ORAL_CAPSULE | ORAL | Status: AC
  Administered 2020-07-22: 3 ug via ORAL
  Filled 2020-07-22: qty 6

## 2020-07-22 MED ORDER — SODIUM BICARBONATE 8.4 % IV SOLN
Freq: Once | INTRAVENOUS | Status: DC
  Filled 2020-07-22: qty 100

## 2020-07-22 MED ORDER — PANTOPRAZOLE SODIUM 40 MG IV SOLR: 40.0000 mg | Freq: Every day | INTRAVENOUS | Status: DC

## 2020-07-22 MED ORDER — INSULIN ASPART 100 UNIT/ML ~~LOC~~ SOLN
2.0000 [IU] | SUBCUTANEOUS | Status: DC
  Administered 2020-07-22: 5 [IU] via SUBCUTANEOUS

## 2020-07-22 MED ORDER — SODIUM BICARBONATE 8.4 % IV SOLN
INTRAVENOUS | Status: AC
  Filled 2020-07-22: qty 50

## 2020-07-22 MED ORDER — ONDANSETRON HCL 4 MG PO TABS: 4.0000 mg | ORAL_TABLET | Freq: Four times a day (QID) | ORAL | Status: DC | PRN

## 2020-07-22 MED ORDER — HEPARIN SODIUM (PORCINE) 5000 UNIT/ML IJ SOLN
5000.0000 [IU] | Freq: Three times a day (TID) | INTRAMUSCULAR | Status: DC
  Filled 2020-07-22: qty 1

## 2020-07-22 MED ORDER — SEVELAMER CARBONATE 800 MG PO TABS
4000.0000 mg | ORAL_TABLET | Freq: Three times a day (TID) | ORAL | Status: DC
  Administered 2020-07-22: 4000 mg via ORAL
  Filled 2020-07-22: qty 5

## 2020-07-22 MED ORDER — POLYETHYLENE GLYCOL 3350 17 G PO PACK: 17.0000 g | PACK | Freq: Every day | ORAL | Status: DC | PRN

## 2020-07-22 MED ORDER — ALBUTEROL SULFATE HFA 108 (90 BASE) MCG/ACT IN AERS: 2.0000 | INHALATION_SPRAY | Freq: Four times a day (QID) | RESPIRATORY_TRACT | Status: DC | PRN

## 2020-07-22 MED ORDER — SODIUM CHLORIDE 0.9% FLUSH
3.0000 mL | Freq: Two times a day (BID) | INTRAVENOUS | Status: DC
  Administered 2020-07-22: 3 mL via INTRAVENOUS

## 2020-07-22 NOTE — Progress Notes (Signed)
K+ 4.8 post HD, pt ok for d/c home.  He will need a ride home, lives about 5 min from here.   Kelly Splinter, MD 07/22/2020, 1:16 PM

## 2020-07-22 NOTE — Consult Note (Signed)
[facility] Nurse Consult Note: Reason for Consult: Patient known to our team from previous consultations. Followed closely by podiatry (Dr. March Rummage) and last visit was 06/28/20. Wound type: Neuropathic Pressure Injury POA: N/A Measurement: Right 5th digit amputation site:  2.5cm x 2cm x 0.1cm with distal third surrounded by purple skin. No exudate. Red, dry wound bed at proximal 2/3 of wound Left foot, plantar aspect: 4cm x 4cm x 0.5cm full thickness wound with red, moist wound bed, scant serous exudate Wound bed:As described above Drainage (amount, consistency, odor) As described above Periwound:intact, dry Dressing procedure/placement/frequency: I have provided Nursing with guidance in the care of these chronic wounds using soap and water cleanse, rinse and pat dry followed by filling defects with saline moistened gauze 2x2 (opened), covering with dry gauze and changing twice daily.  [facility] nursing team will not follow, but will remain available to this patient, the nursing and medical teams.  Please re-consult if needed. Thanks, Maudie Flakes, MSN, RN, [place], Arther Abbott  Pager# [phone]

## 2020-07-22 NOTE — Telephone Encounter (Signed)
Please call [name]... He is in the hospital and wants to speak to you.

## 2020-07-22 NOTE — Procedures (Signed)
   I was present at this dialysis session, have reviewed the session itself and made  appropriate changes Kelly Splinter MD [place] pager [phone]   07/22/2020, 7:35 AM

## 2020-07-22 NOTE — ED Provider Notes (Signed)
Attestation: Medical screening examination/treatment/procedure(s) were conducted as a shared visit with non-physician practitioner(s) and myself.  I personally evaluated the patient during the encounter.   Briefly, the patient is a 39 y.o. male with h/o ESRD on dialysis MWF, here by EMS after they were called out for generalized fatigue.  When they arrived patient was extremely lethargic.  EKG revealed wide QRS complex.  Patient immediately transported here..   Vitals:   07/22/20 0430 07/22/20 0445  BP: (!) 167/93 (!) 171/91  Pulse: 96 (!) 104  Resp: (!) 23 (!) 22  SpO2: 100% 100%    CONSTITUTIONAL: Toxic-appearing, NAD NEURO: Lethargic no focal deficits EYES:  pupils equal and reactive ENT/NECK:  trachea midline, no JVD CARDIO: Tachy rate, irregular rhythm, well-perfused PULM:  labored breathing GI/GU:  Abdomen non-distended MSK/SPINE:  No gross deformities, no edema SKIN: Deep tissue wound to the left foot    EKG Interpretation  Date/Time:  Monday July 22 2020 04:28:46 EDT Ventricular Rate:  93 PR Interval:    QRS Duration: 156 QT Interval:  429 QTC Calculation: 508 R Axis:   119 Text Interpretation: Atrial fibrillation Nonspecific intraventricular conduction delay Minimal ST depression, inferior leads Confirmed by Addison Lank ([id number]) on 07/22/2020 5:37:53 AM       Patient had QRS on EMS EKG in sinus wave pattern.  IV and IO access immediately obtained.  Patient given multiple doses of calcium and bicarb resulting in close QRS; peaked TW still present.  Repeated doses of calcium and bicarb were required. Given Insulin.  Nephrology and ICU consulted immediately. Dr. Jonnie Finner (Nephro) arrived at bedside. Admission to the ICU for hemodialysis was expedited and he was immediately transported.  .Critical Care Performed by: Fatima Blank, MD Authorized by: Fatima Blank, MD    CRITICAL CARE Performed by: [name] Aniqua Briere Total critical care time:  30 2 to her diarrhea get her some lab above no here her heart was a right her positive given treat her in the center, she wanted just getting her complaints that she meets criteria to get here she tested negative outside and tested positive here minutes Critical care time was exclusive of separately billable procedures and treating other patients. Critical care was necessary to treat or prevent imminent or life-threatening deterioration. Critical care was time spent personally by me on the following activities: development of treatment plan with patient and/or surrogate as well as nursing, discussions with consultants, evaluation of patient's response to treatment, examination of patient, obtaining history from patient or surrogate, ordering and performing treatments and interventions, ordering and review of laboratory studies, ordering and review of radiographic studies, pulse oximetry and re-evaluation of patient's condition.       Fatima Blank, MD 07/23/20 [phone]

## 2020-07-22 NOTE — Consult Note (Addendum)
Renal Service Consult Note [name]  [name] 07/22/2020 [name], MD Requesting Physician: Dr. Duwayne Heck, Delane Ginger.   Reason for Consult:  ESRD pt w/ hyperkalemia HPI: The patient is a 39 y.o. year-old w/ hx of IDDM, HTN and ESRD on HD presented to ED w/ gen'd weakness brought by EMS, also SOB. Last HD Friday, hasn't missed. States he was "clotting" a lot and didn't get good HD on Friday. Per EMS pt was lethargic , hypotensive w/ abnl EKG.  In ED pt had wide QRS and irregular rhythm 93 bpm. Pt was treated empirically for hyperkalemia w/ ins /glu/ NaHCO3/ Ca++. Asked to see for dialysis.   Pt seen in ED, telemetry tracing starting to become more regular. Pt is alert, no c/o's, shivering.  No CP, mild SOB, no cough or fever. covid swab pending.    ROS  denies CP  no joint pain   no HA  no blurry vision  no rash  no diarrhea  no nausea/ vomiting   Past Medical History  Past Medical History:  Diagnosis Date  . Diabetes mellitus without complication ([place])   . Diabetic gastroparesis ([place])   . Dialysis patient ([place])   . Hypertension   . Renal disorder    Dialysis  . Sepsis [facility])    Past Surgical History  Past Surgical History:  Procedure Laterality Date  . AMPUTATION Right 02/02/2018   Procedure: RIGHT FIFTH TOE AND METATARSAL AMPUTATION. Filetted toe flap metatarsal resection. Debridement Plantar Foot wound;  Surgeon: Evelina Bucy, DPM;  Location: [facility];  Service: Podiatry;  Laterality: Right;  . AMPUTATION Left 08/20/2018   Procedure: FIFTH METATARSAL BONE BIOPSY;  Surgeon: Evelina Bucy, DPM;  Location: [place];  Service: Podiatry;  Laterality: Left;  . AMPUTATION Left 10/28/2018   Procedure: LEFT GREAT TOE AMPUTATION;  Surgeon: Evelina Bucy, DPM;  Location: [facility];  Service: Podiatry;  Laterality: Left;  . APPLICATION OF WOUND VAC  02/02/2018   Procedure: APPLICATION OF WOUND VAC  Right Foot;  Surgeon: Evelina Bucy, DPM;  Location: [employer];   Service: Podiatry;;  . APPLICATION OF WOUND VAC Left 10/28/2018   Procedure: APPLICATION OF WOUND VAC LEFT TOE;  Surgeon: Evelina Bucy, DPM;  Location: [place];  Service: Podiatry;  Laterality: Left;  . APPLICATION OF WOUND VAC Left 11/01/2018   Procedure: APPLICATION OF WOUND VAC;  Surgeon: Evelina Bucy, DPM;  Location: [place];  Service: Podiatry;  Laterality: Left;  . AV FISTULA PLACEMENT     left arm.  . AV FISTULA PLACEMENT Right 12/22/2016   Procedure: INSERTION OF ARTERIOVENOUS (AV) GORE-TEX GRAFT ARM;  Surgeon: Elam Dutch, MD;  Location: [facility] OR;  Service: Vascular;  Laterality: Right;  . AV FISTULA PLACEMENT Left 05/26/2018   Procedure: INSERTION OF  ARTERIOVENOUS (AV) GORE-TEX GRAFT LEFT ARM;  Surgeon: Serafina Mitchell, MD;  Location: [facility];  Service: Vascular;  Laterality: Left;  . EYE SURGERY    . I & D EXTREMITY Right 10/31/2017   Procedure: IRRIGATION AND DEBRIDEMENT RIGHT FOOT;  Surgeon: Evelina Bucy, DPM;  Location: [facility] [place];  Service: Podiatry;  Laterality: Right;  . I & D EXTREMITY Left 08/20/2018   Procedure: IRRIGATION AND DEBRIDEMENT EXTREMITY WITH SECONDARY WOUND CLOSUREAND APPLICATION OF WOUND VAC LEFT FOOT;  Surgeon: Evelina Bucy, DPM;  Location: [place];  Service: Podiatry;  Laterality: Left;  . I & D EXTREMITY Left 10/20/2018   Procedure: IRRIGATION AND DEBRIDEMENT  LEFT FOOT  DEBRIDEMENT LATERAL FOOT WOUND;  Surgeon: Evelina Bucy, DPM;  Location: [place];  Service: Podiatry;  Laterality: Left;  . I & D EXTREMITY Left 10/28/2018   Procedure: IRRIGATION AND DEBRIDEMENT LEFT TOE;  Surgeon: Evelina Bucy, DPM;  Location: [place];  Service: Podiatry;  Laterality: Left;  . INSERTION OF DIALYSIS CATHETER     Right subclavian  . IR AV DIALY SHUNT INTRO NEEDLE/INTRACATH INITIAL W/PTA/IMG RIGHT Right 02/05/2018  . IR FLUORO GUIDE CV LINE RIGHT  01/31/2020  . IR THROMBECTOMY AV FISTULA W/THROMBOLYSIS/PTA INC/SHUNT/IMG LEFT Left 08/24/2018  . IR THROMBECTOMY AV FISTULA  W/THROMBOLYSIS/PTA INC/SHUNT/IMG LEFT Left 01/06/2019  . IR US GUIDE VASC ACCESS LEFT  08/24/2018  . IR US GUIDE VASC ACCESS RIGHT  02/05/2018  . IR US GUIDE VASC ACCESS RIGHT  01/31/2020  . IRRIGATION AND DEBRIDEMENT FOOT Right 10/23/2018   Procedure: Irrigation and Debridement to tendon, Left Foot;  Surgeon: Evelina Bucy, DPM;  Location: [place];  Service: Podiatry;  Laterality: Right;  . IRRIGATION AND DEBRIDEMENT FOOT Left 11/01/2018   Procedure: IRRIGATION AND DEBRIDEMENT PARTIAL WOUND CLOSURE LOCAL TISSUE TRANSFER AND FLAP ROTATION, LEFT FOOT;  Surgeon: Evelina Bucy, DPM;  Location: [facility];  Service: Podiatry;  Laterality: Left;  . TRANSMETATARSAL AMPUTATION N/A 08/18/2018   Procedure: IRRIGATION AND DEBRIDEMENT OF LEFT 5TH TOE AND TRANSMETATARSAL, WITH PARTICAL LEFT 5TH TOE AND METATARSAL AMPUTATION, BONE BIOPSY, WOUND VAC APPLICATION.;  Surgeon: Evelina Bucy, DPM;  Location: [place];  Service: Podiatry;  Laterality: N/A;  . UPPER EXTREMITY VENOGRAPHY N/A 11/16/2016   Procedure: Upper Extremity Venography - Right Central;  Surgeon: Elam Dutch, MD;  Location: [facility] CV LAB;  Service: Cardiovascular;  Laterality: N/A;  . UPPER EXTREMITY VENOGRAPHY N/A 05/25/2018   Procedure: UPPER EXTREMITY VENOGRAPHY - Bilateral;  Surgeon: Marty Heck, MD;  Location: [facility];  Service: Cardiovascular;  Laterality: N/A;   Family History  Family History  Problem Relation Age of Onset  . Diabetes Mellitus II Other   . Diabetes Father   . Renal Disease Father        ESRD   Social History  reports that he has never smoked. He has never used smokeless tobacco. He reports that he does not drink alcohol and does not use drugs. Allergies No Known Allergies Home medications Prior to Admission medications   Medication Sig Start Date End Date Taking? Authorizing Provider  albuterol (VENTOLIN HFA) 108 (90 Base) MCG/ACT inhaler Inhale 2 puffs into the lungs every 6 (six) hours as  needed for wheezing or shortness of breath. 02/18/20   Thurnell Lose, MD  Blood Glucose Monitoring Suppl ([place]) w/Device KIT 1 Bag by Does not apply route 3 (three) times daily before meals. 05/02/20   Kerin Perna, NP  calcitRIOL (ROCALTROL) 0.5 MCG capsule Take 5 capsules (2.5 mcg total) by mouth every Monday, Wednesday, and Friday with hemodialysis. 10/26/18   Hongalgi, Lenis Dickinson, MD  cinacalcet (SENSIPAR) 60 MG tablet SMARTSIG:2 Tablet(s) By Mouth Every Evening 04/24/20   [provider]  dextrose 50 % solution Dextrose 50% 03/11/20 03/10/21  [provider]  glucose blood (ONETOUCH VERIO) test strip Check blood sugars three times daily 07/01/20   Kerin Perna, NP  insulin glargine (LANTUS SOLOSTAR) 100 UNIT/ML Solostar Pen Inject 12 Units into the skin daily. 02/13/20   Kerin Perna, NP  Lancets [facility] DELICA PLUS [id number]) MISC Apply topically. 05/02/20  [provider]  Methoxy PEG-Epoetin Beta (MIRCERA IJ) Mircera 04/19/20 04/18/21  [provider]  mupirocin ointment (BACTROBAN) 2 % Apply to wound twice a day. 05/01/20   McDonald, [name] R, DPM  NOVOLOG 100 UNIT/ML injection INJECT 6 UNITS SUBCUTANEOUSLY TWICE DAILY WITH A MEAL 07/19/20   Kerin Perna, NP  oxyCODONE (OXY IR/ROXICODONE) 5 MG immediate release tablet Take 5 mg by mouth 2 (two) times daily as needed. 02/01/20   [provider]  sevelamer carbonate (RENVELA) 800 MG tablet Take 2,400-4,000 mg by mouth See admin instructions. Take 5 tablets with meals then take 3 tablets with snacks 04/27/19   [provider]     Vitals:   07/22/20 0425 07/22/20 0430 07/22/20 0445 07/22/20 0500  BP:  (!) 167/93 (!) 171/91 (!) 192/101  Pulse:  96 (!) 104 (!) 107  Resp:  (!) 23 (!) 22 20  SpO2:  100% 100% 100%  Weight: 67 kg     Height: _0  (1.702 m)      Exam Gen alert, anxious, no resp distress No rash, cyanosis or gangrene Sclera anicteric,  throat clear  No jvd or bruits Chest clear bilat to bases, no rales RRR no MRG Abd soft ntnd no mass or ascites +bs GU normal male MS no joint effusions or deformity Ext no leg edema, large left foot ulcer w/o drainage or odor Neuro is alert, Ox 3 , nf R IJ TDC    Home meds:  - sensipar 120 hs/ renvela 5 ac tid  - oxy IR 47m bid prn  - insulin glargine 12u qd/ novolog 6u bid ac  - prn's/ vitamins/ supplements    OP HD:  MWF [facility]  4h 126m   65.5kg  400/800   RIJ TDC  Hep 5000+ 5000 midrun   - ave UF 5-7 kg   - vanc 75011m 12 thru 08/09/20  - sensipar 120 hs   - calcitriol 3.0 ug tiw  - last Hb 12, no esa, just finished IV fe course   Assessment/ Plan: 1. Severe hyperkalemia - w/ wide QRS, improved w/ ED Rx, starting on HD in ICU now.  Low K+ bath.   2. ESRD - on HD MWF. HD this am.  3. BP / volume - not on and bp -lowering meds, tends to have large wt gains , UF ave is 5- 7 kg.   4. MBD ckd - cont binder, sensipar, calcitriol 5. Anemia ckd - no HD meds, last Hb 12 6. IDDM - per primary team 7. 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 Sees a podiatrist for this. IV pain meds ordered for 24 hrs here.      RobKelly SplinterD 07/22/2020, 5:58 AM     Recent Labs  Lab 07/22/20 0435  WBC 14.3*  HGB 11.6*   Recent Labs  Lab 07/22/20 0435  K >7.5*  BUN 104*  CREATININE 8.76*  CALCIUM 9.1

## 2020-07-22 NOTE — Progress Notes (Signed)
[facility] Progress Note Patient Name: [name] DOB: 06/28/1981 MRN: [id number]   Date of Service  07/22/2020  HPI/Events of Note  ED hand off:  Notified bed side CCM same.   37 male with ESRD, HTN, DM coming to 2M14 for emergent HD by Nephrologist ( seeing at ED-symptomatic severe hyperkalemia-brady, sine wave), finishing hyperkalemia Rx in ED.   eICU Interventions  As above.      Intervention Category Major Interventions: Acid-Base disturbance - evaluation and management;Other: Intermediate Interventions: Communication with other healthcare providers and/or family  Elmer Sow 07/22/2020, 4:59 AM

## 2020-07-22 NOTE — Progress Notes (Signed)
Patient seen. Getting HD. Maybe can go home after HD depending on K.  Erskine Emery MD PCCM

## 2020-07-22 NOTE — ED Notes (Signed)
Pt placed on defibrillator pads.

## 2020-07-22 NOTE — ED Triage Notes (Signed)
Pt from home by EMS; called for weakness; pt is dialysis with last session on Friday; pt c/o SOB on arrival.

## 2020-07-22 NOTE — Progress Notes (Signed)
CSW delivered Medicare Observation Status notification information to patient. Patient accepted.

## 2020-07-22 NOTE — Progress Notes (Signed)
Per patient discussion will change diet to full diet as patient states he has unhooked himself in the past to go to the cafeteria in order to get what foods he wants. Patient states he has also attempted to leave AMA in the past when on a renal diet or fluid restriction. Patient states he will do this again if diet isn't changed. Patietn states he understands the reason for the renal diet and the risks of not following said diet and states he accepts the risks of changing to a full diet. Will change to full diet for when patient has completed dialysis and had his labs rechecked.

## 2020-07-22 NOTE — H&P (Signed)
NAME:  [name], MRN:  [id number], DOB:  Dec 01, 1980, LOS: 0 ADMISSION DATE:  07/22/2020, CONSULTATION DATE:  07/22/20 REFERRING MD:  Bosie Clos, CHIEF COMPLAINT: weakness, SOB  Brief History   39 yo man with history of ESRD, DM, HTN, here with weakness and sob, found to have metabolic acidosis and hyperkalemia.    History of present illness   Per patient there were problems with HD on Friday (clotting in the line?).   QRS prolonged, peaked T waves, found to have hyperkalemia, acidosis.  Moved up to the ICU stat for HD.    Past Medical History  DM2 HTN ESRD on HD  Significant Hospital Events     Consults:  Renal   Procedures:    Significant Diagnostic Tests:  CXR: The lungs are clear without focal pneumonia, edema, pneumothorax or pleural effusion. Right IJ central line tip overlies the upper right atrium. Vascular stent device noted left proximal arm/axillary region. Telemetry leads overlie the chest.  Micro Data:    Antimicrobials:  Vancomycin 10/25 (started on 10/1 per chart review)  Interim history/subjective:   Objective   Blood pressure (!) 103/40, pulse (!) 107, resp. rate 20, height _0  (1.702 m), weight 67 kg, SpO2 100 %.       No intake or output data in the 24 hours ending 07/22/20 0645 Filed Weights   07/22/20 0425  Weight: 67 kg    Examination: General: Sleepy but easily arousable HENT: NCAT, tunneled dialysis catheter in place Lungs: CTAB Cardiovascular: RRR no mgr Abdomen: nt, nd, nbs Extremities:  Wound on L heel packed with gauze  Neuro: sleepy but arousable.   Resolved Hospital Problem list     Assessment & Plan:  Hyperkalemia, acidosis: ESRD.   S/p temporizing measures for hyperkalemia.  Calcium, bicarb, insulin.  Currently getting started on HD.  Recheck labs following HD.   DM: lantus, SS insulin.   HTN: controlled right now.  Not on antiHTN on review of home meds.    Best practice:  Diet: Renal  diet Pain/Anxiety/Delirium protocol (if indicated):  VAP protocol (if indicated):  DVT prophylaxis: heparin GI prophylaxis: protonix Glucose control: insulin Mobility:  Code Status: Full  Family Communication: Disposition:   Labs   CBC: Recent Labs  Lab 07/22/20 0435  WBC 14.3*  NEUTROABS 10.3*  HGB 11.6*  HCT 38.5*  MCV 100.0  PLT 638    Basic Metabolic Panel: Recent Labs  Lab 07/22/20 0435  NA 130*  K >7.5*  CL 102  CO2 12*  GLUCOSE 307*  BUN 104*  CREATININE 8.76*  CALCIUM 9.1   GFR: Estimated Creatinine Clearance: 10.5 mL/min (A) (by C-G formula based on SCr of 8.76 mg/dL (H)). Recent Labs  Lab 07/22/20 0435  WBC 14.3*    Liver Function Tests: Recent Labs  Lab 07/22/20 0435  AST 32  ALT 25  ALKPHOS 151*  BILITOT 0.9  PROT 6.8  ALBUMIN 2.9*   No results for input(s): LIPASE, AMYLASE in the last 168 hours. No results for input(s): AMMONIA in the last 168 hours.  ABG    Component Value Date/Time   HCO3 24.7 04/24/2019 0814   TCO2 26 05/30/2020 2002   ACIDBASEDEF 8.9 (H) 05/27/2015 0500   O2SAT 63.0 04/24/2019 0814     Coagulation Profile: Recent Labs  Lab 07/22/20 0618  INR 1.2    Cardiac Enzymes: No results for input(s): CKTOTAL, CKMB, CKMBINDEX, TROPONINI in the last 168 hours.  HbA1C: Hemoglobin A1C  Date/Time Value Ref  Range Status  05/25/2019 02:44 PM 8.0 (A) 4.0 - 5.6 % Final  08/22/2014 06:28 AM 5.8 4.2 - 6.3 % Final    Comment:    The American Diabetes Association recommends that a primary goal of therapy should be <7% and that physicians should reevaluate the treatment regimen in patients with HbA1c values consistently >8%.   06/28/2014 08:04 PM 7.1 (H) 4.2 - 6.3 % Final    Comment:    The American Diabetes Association recommends that a primary goal of therapy should be <7% and that physicians should reevaluate the treatment regimen in patients with HbA1c values consistently >8%.    Hgb A1c MFr Bld  Date/Time  Value Ref Range Status  01/31/2020 06:22 AM 8.9 (H) 4.8 - 5.6 % Final    Comment:    (NOTE) Pre diabetes:          5.7%-6.4% Diabetes:              >6.4% Glycemic control for   <7.0% adults with diabetes   10/23/2018 10:34 AM 6.7 (H) 4.8 - 5.6 % Final    Comment:    (NOTE) Pre diabetes:          5.7%-6.4% Diabetes:              >6.4% Glycemic control for   <7.0% adults with diabetes     CBG: Recent Labs  Lab 07/22/20 0450  GLUCAP 295*    Review of Systems:   Patient not very cooperative with interview  Past Medical History  He,  has a past medical history of Diabetes mellitus without complication ([place]), Diabetic gastroparesis ([place]), Dialysis patient ([place]), Hypertension, Renal disorder, and Sepsis ([place]).   Surgical History    Past Surgical History:  Procedure Laterality Date  . AMPUTATION Right 02/02/2018   Procedure: RIGHT FIFTH TOE AND METATARSAL AMPUTATION. Filetted toe flap metatarsal resection. Debridement Plantar Foot wound;  Surgeon: Evelina Bucy, DPM;  Location: [place];  Service: Podiatry;  Laterality: Right;  . AMPUTATION Left 08/20/2018   Procedure: FIFTH METATARSAL BONE BIOPSY;  Surgeon: Evelina Bucy, DPM;  Location: [facility];  Service: Podiatry;  Laterality: Left;  . AMPUTATION Left 10/28/2018   Procedure: LEFT GREAT TOE AMPUTATION;  Surgeon: Evelina Bucy, DPM;  Location: [place] [facility];  Service: Podiatry;  Laterality: Left;  . APPLICATION OF WOUND VAC  02/02/2018   Procedure: APPLICATION OF WOUND VAC  Right Foot;  Surgeon: Evelina Bucy, DPM;  Location: [place];  Service: Podiatry;;  . APPLICATION OF WOUND VAC Left 10/28/2018   Procedure: APPLICATION OF WOUND VAC LEFT TOE;  Surgeon: Evelina Bucy, DPM;  Location: [place];  Service: Podiatry;  Laterality: Left;  . APPLICATION OF WOUND VAC Left 11/01/2018   Procedure: APPLICATION OF WOUND VAC;  Surgeon: Evelina Bucy, DPM;  Location: [facility] [facility];  Service: Podiatry;  Laterality: Left;  . AV FISTULA PLACEMENT     left  arm.  . AV FISTULA PLACEMENT Right 12/22/2016   Procedure: INSERTION OF ARTERIOVENOUS (AV) GORE-TEX GRAFT ARM;  Surgeon: Elam Dutch, MD;  Location: [facility] OR;  Service: Vascular;  Laterality: Right;  . AV FISTULA PLACEMENT Left 05/26/2018   Procedure: INSERTION OF  ARTERIOVENOUS (AV) GORE-TEX GRAFT LEFT ARM;  Surgeon: Serafina Mitchell, MD;  Location: [facility];  Service: Vascular;  Laterality: Left;  . EYE SURGERY    . I & D EXTREMITY Right 10/31/2017   Procedure: IRRIGATION AND DEBRIDEMENT RIGHT FOOT;  Surgeon: Hardie Pulley  J, DPM;  Location: [place];  Service: Podiatry;  Laterality: Right;  . I & D EXTREMITY Left 08/20/2018   Procedure: IRRIGATION AND DEBRIDEMENT EXTREMITY WITH SECONDARY WOUND CLOSUREAND APPLICATION OF WOUND VAC LEFT FOOT;  Surgeon: Evelina Bucy, DPM;  Location: [place];  Service: Podiatry;  Laterality: Left;  . I & D EXTREMITY Left 10/20/2018   Procedure: IRRIGATION AND DEBRIDEMENT LEFT FOOT  DEBRIDEMENT LATERAL FOOT WOUND;  Surgeon: Evelina Bucy, DPM;  Location: [name];  Service: Podiatry;  Laterality: Left;  . I & D EXTREMITY Left 10/28/2018   Procedure: IRRIGATION AND DEBRIDEMENT LEFT TOE;  Surgeon: Evelina Bucy, DPM;  Location: [place];  Service: Podiatry;  Laterality: Left;  . INSERTION OF DIALYSIS CATHETER     Right subclavian  . IR AV DIALY SHUNT INTRO NEEDLE/INTRACATH INITIAL W/PTA/IMG RIGHT Right 02/05/2018  . IR FLUORO GUIDE CV LINE RIGHT  01/31/2020  . IR THROMBECTOMY AV FISTULA W/THROMBOLYSIS/PTA INC/SHUNT/IMG LEFT Left 08/24/2018  . IR THROMBECTOMY AV FISTULA W/THROMBOLYSIS/PTA INC/SHUNT/IMG LEFT Left 01/06/2019  . IR US GUIDE VASC ACCESS LEFT  08/24/2018  . IR US GUIDE VASC ACCESS RIGHT  02/05/2018  . IR US GUIDE VASC ACCESS RIGHT  01/31/2020  . IRRIGATION AND DEBRIDEMENT FOOT Right 10/23/2018   Procedure: Irrigation and Debridement to tendon, Left Foot;  Surgeon: Evelina Bucy, DPM;  Location: [place];  Service: Podiatry;  Laterality: Right;  . IRRIGATION AND  DEBRIDEMENT FOOT Left 11/01/2018   Procedure: IRRIGATION AND DEBRIDEMENT PARTIAL WOUND CLOSURE LOCAL TISSUE TRANSFER AND FLAP ROTATION, LEFT FOOT;  Surgeon: Evelina Bucy, DPM;  Location: [place];  Service: Podiatry;  Laterality: Left;  . TRANSMETATARSAL AMPUTATION N/A 08/18/2018   Procedure: IRRIGATION AND DEBRIDEMENT OF LEFT 5TH TOE AND TRANSMETATARSAL, WITH PARTICAL LEFT 5TH TOE AND METATARSAL AMPUTATION, BONE BIOPSY, WOUND VAC APPLICATION.;  Surgeon: Evelina Bucy, DPM;  Location: [place];  Service: Podiatry;  Laterality: N/A;  . UPPER EXTREMITY VENOGRAPHY N/A 11/16/2016   Procedure: Upper Extremity Venography - Right Central;  Surgeon: Elam Dutch, MD;  Location: [facility];  Service: Cardiovascular;  Laterality: N/A;  . UPPER EXTREMITY VENOGRAPHY N/A 05/25/2018   Procedure: UPPER EXTREMITY VENOGRAPHY - Bilateral;  Surgeon: Marty Heck, MD;  Location: [facility];  Service: Cardiovascular;  Laterality: N/A;     Social History   reports that he has never smoked. He has never used smokeless tobacco. He reports that he does not drink alcohol and does not use drugs.   Family History   His family history includes Diabetes in his father; Diabetes Mellitus II in an other family member; Renal Disease in his father.   Allergies No Known Allergies   Home Medications  Prior to Admission medications   Medication Sig Start Date End Date Taking? Authorizing Provider  albuterol (VENTOLIN HFA) 108 (90 Base) MCG/ACT inhaler Inhale 2 puffs into the lungs every 6 (six) hours as needed for wheezing or shortness of breath. 02/18/20   Thurnell Lose, MD  Blood Glucose Monitoring Suppl ([place]) w/Device KIT 1 Bag by Does not apply route 3 (three) times daily before meals. 05/02/20   Kerin Perna, NP  calcitRIOL (ROCALTROL) 0.5 MCG capsule Take 5 capsules (2.5 mcg total) by mouth every Monday, Wednesday, and Friday with hemodialysis. 10/26/18   Modena Jansky, MD  cinacalcet (SENSIPAR) 60 MG tablet SMARTSIG:2 Tablet(s) By Mouth Every Evening 04/24/20   [provider]  dextrose 50 %  solution Dextrose 50% 03/11/20 03/10/21  [provider]  glucose blood (ONETOUCH VERIO) test strip Check blood sugars three times daily 07/01/20   Kerin Perna, NP  insulin glargine (LANTUS SOLOSTAR) 100 UNIT/ML Solostar Pen Inject 12 Units into the skin daily. 02/13/20   Kerin Perna, NP  Lancets [facility] DELICA PLUS [id number]) MISC Apply topically. 05/02/20   [provider]  Methoxy PEG-Epoetin Beta (MIRCERA IJ) Mircera 04/19/20 04/18/21  [provider]  mupirocin ointment (BACTROBAN) 2 % Apply to wound twice a day. 05/01/20   McDonald, [name] R, DPM  NOVOLOG 100 UNIT/ML injection INJECT 6 UNITS SUBCUTANEOUSLY TWICE DAILY WITH A MEAL 07/19/20   Kerin Perna, NP  oxyCODONE (OXY IR/ROXICODONE) 5 MG immediate release tablet Take 5 mg by mouth 2 (two) times daily as needed. 02/01/20   [provider]  sevelamer carbonate (RENVELA) 800 MG tablet Take 2,400-4,000 mg by mouth See admin instructions. Take 5 tablets with meals then take 3 tablets with snacks 04/27/19   [provider]     Critical care time: 40 minutes

## 2020-07-22 NOTE — ED Provider Notes (Signed)
[facility] EMERGENCY DEPARTMENT Provider Note   CSN: [id number] Arrival date & time: 07/22/20  2003     History Chief Complaint  Patient presents with  . Weakness    [name] is a 39 y.o. male with a hx of ESRD on dialysis presents to the Emergency Department with bradycardia, hypotension and altered mental status.  Patient denies missed doses of dialysis however did have to stop last session early.  Per EMS, they were called for weakness and shortness of breath. EMS found patient to have abnormal EKG, lethargy and hypotension.  Difficult venous access.  Level 5 caveat for acuity of condition, altered mental status.   The history is provided by the EMS personnel and medical records. No language interpreter was used.       Past Medical History:  Diagnosis Date  . Diabetes mellitus without complication ([place])   . Diabetic gastroparesis ([place])   . Dialysis patient ([place])   . Hypertension   . Renal disorder    Dialysis  . Sepsis [facility])     Patient Active Problem List   Diagnosis Date Noted  . Hypocalcemia 06/07/2020  . Diabetic ulcer of heel ([place]) 05/31/2020  . Type 2 diabetes mellitus with foot ulcer ([place]) 04/23/2020  . COVID-19 02/20/2020  . Personal history of COVID-19 02/20/2020  . Diabetic ulcer of left midfoot associated with type 1 diabetes mellitus, with fat layer exposed ([place])   . Charcot's joint of foot, left   . Pneumonia due to COVID-19 virus 02/14/2020  . Cellulitis of left foot 01/31/2020  . Allergy, unspecified, initial encounter 07/17/2019  . Diarrhea 04/24/2019  . Bone erosion determined by x-ray   . Encephalopathy acute   . Diabetic ulcer of left midfoot associated with diabetes mellitus due to underlying condition, with necrosis of bone ([place])   . Open wound of left foot   . Encounter for orthopedic aftercare following surgical amputation   . Foot infection   . Gangrene of toe of left foot ([place])   . Cellulitis 10/26/2018  .  Diabetic ulcer of left midfoot associated with diabetes mellitus due to underlying condition, with bone involvement without evidence of necrosis ([place])   . Nausea vomiting and diarrhea 10/20/2018  . DM (diabetes mellitus), secondary, uncontrolled, with neurologic complications ([place])   . Headache 09/23/2018  . Acute osteomyelitis involving ankle and foot, left ([place])   . Anemia of chronic disease 08/18/2018  . Encounter for immunization 06/29/2018  . Hyperkalemia   . Unspecified protein-calorie malnutrition ([place]) 02/14/2018  . Encounter for removal of sutures 02/07/2018  . Fluid overload, unspecified 01/22/2018  . Osteomyelitis of ankle or foot, acute, right ([place]) 10/30/2017  . Cellulitis and abscess of toe of left foot   . Diabetic foot infection ([place])   . Insulin dependent type 1 diabetes mellitus ([place]) 10/25/2017  . Diabetic gastroparesis ([place]) 10/25/2017  . Sepsis ([place]) 10/25/2017  . Fever, unspecified 10/28/2016  . Iron deficiency anemia, unspecified 10/28/2016  . Other specified coagulation defects ([place]) 10/28/2016  . Pruritus, unspecified 10/28/2016  . Secondary hyperparathyroidism of renal origin ([place]) 10/28/2016  . Shortness of breath 10/28/2016  . HTN (hypertension) 08/09/2014  . ESRD on dialysis ([place]) 03/19/2014  . Metabolic acidosis 79/44/4619    Past Surgical History:  Procedure Laterality Date  . AMPUTATION Right 02/02/2018   Procedure: RIGHT FIFTH TOE AND METATARSAL AMPUTATION. Filetted toe flap metatarsal resection. Debridement Plantar Foot wound;  Surgeon: Evelina Bucy, DPM;  Location: [facility];  Service: Podiatry;  Laterality: Right;  . AMPUTATION Left 08/20/2018   Procedure: FIFTH METATARSAL BONE BIOPSY;  Surgeon: Evelina Bucy, DPM;  Location: [facility];  Service: Podiatry;  Laterality: Left;  . AMPUTATION Left 10/28/2018   Procedure: LEFT GREAT TOE AMPUTATION;  Surgeon: Evelina Bucy, DPM;  Location: [facility];  Service: Podiatry;  Laterality: Left;  . APPLICATION OF  WOUND VAC  02/02/2018   Procedure: APPLICATION OF WOUND VAC  Right Foot;  Surgeon: Evelina Bucy, DPM;  Location: [place];  Service: Podiatry;;  . APPLICATION OF WOUND VAC Left 10/28/2018   Procedure: APPLICATION OF WOUND VAC LEFT TOE;  Surgeon: Evelina Bucy, DPM;  Location: [facility];  Service: Podiatry;  Laterality: Left;  . APPLICATION OF WOUND VAC Left 11/01/2018   Procedure: APPLICATION OF WOUND VAC;  Surgeon: Evelina Bucy, DPM;  Location: [place];  Service: Podiatry;  Laterality: Left;  . AV FISTULA PLACEMENT     left arm.  . AV FISTULA PLACEMENT Right 12/22/2016   Procedure: INSERTION OF ARTERIOVENOUS (AV) GORE-TEX GRAFT ARM;  Surgeon: Elam Dutch, MD;  Location: [facility] OR;  Service: Vascular;  Laterality: Right;  . AV FISTULA PLACEMENT Left 05/26/2018   Procedure: INSERTION OF  ARTERIOVENOUS (AV) GORE-TEX GRAFT LEFT ARM;  Surgeon: Serafina Mitchell, MD;  Location: [place];  Service: Vascular;  Laterality: Left;  . EYE SURGERY    . I & D EXTREMITY Right 10/31/2017   Procedure: IRRIGATION AND DEBRIDEMENT RIGHT FOOT;  Surgeon: Evelina Bucy, DPM;  Location: [place];  Service: Podiatry;  Laterality: Right;  . I & D EXTREMITY Left 08/20/2018   Procedure: IRRIGATION AND DEBRIDEMENT EXTREMITY WITH SECONDARY WOUND CLOSUREAND APPLICATION OF WOUND VAC LEFT FOOT;  Surgeon: Evelina Bucy, DPM;  Location: [place];  Service: Podiatry;  Laterality: Left;  . I & D EXTREMITY Left 10/20/2018   Procedure: IRRIGATION AND DEBRIDEMENT LEFT FOOT  DEBRIDEMENT LATERAL FOOT WOUND;  Surgeon: Evelina Bucy, DPM;  Location: [place];  Service: Podiatry;  Laterality: Left;  . I & D EXTREMITY Left 10/28/2018   Procedure: IRRIGATION AND DEBRIDEMENT LEFT TOE;  Surgeon: Evelina Bucy, DPM;  Location: [facility];  Service: Podiatry;  Laterality: Left;  . INSERTION OF DIALYSIS CATHETER     Right subclavian  . IR AV DIALY SHUNT INTRO NEEDLE/INTRACATH INITIAL W/PTA/IMG RIGHT Right 02/05/2018  . IR FLUORO GUIDE CV LINE RIGHT   01/31/2020  . IR THROMBECTOMY AV FISTULA W/THROMBOLYSIS/PTA INC/SHUNT/IMG LEFT Left 08/24/2018  . IR THROMBECTOMY AV FISTULA W/THROMBOLYSIS/PTA INC/SHUNT/IMG LEFT Left 01/06/2019  . IR US GUIDE VASC ACCESS LEFT  08/24/2018  . IR US GUIDE VASC ACCESS RIGHT  02/05/2018  . IR US GUIDE VASC ACCESS RIGHT  01/31/2020  . IRRIGATION AND DEBRIDEMENT FOOT Right 10/23/2018   Procedure: Irrigation and Debridement to tendon, Left Foot;  Surgeon: Evelina Bucy, DPM;  Location: [facility];  Service: Podiatry;  Laterality: Right;  . IRRIGATION AND DEBRIDEMENT FOOT Left 11/01/2018   Procedure: IRRIGATION AND DEBRIDEMENT PARTIAL WOUND CLOSURE LOCAL TISSUE TRANSFER AND FLAP ROTATION, LEFT FOOT;  Surgeon: Evelina Bucy, DPM;  Location: [facility];  Service: Podiatry;  Laterality: Left;  . TRANSMETATARSAL AMPUTATION N/A 08/18/2018   Procedure: IRRIGATION AND DEBRIDEMENT OF LEFT 5TH TOE AND TRANSMETATARSAL, WITH PARTICAL LEFT 5TH TOE AND METATARSAL AMPUTATION, BONE BIOPSY, WOUND VAC APPLICATION.;  Surgeon: Evelina Bucy, DPM;  Location: [facility];  Service: Podiatry;  Laterality: N/A;  . UPPER EXTREMITY VENOGRAPHY N/A  11/16/2016   Procedure: Upper Extremity Venography - Right Central;  Surgeon: Elam Dutch, MD;  Location: [facility] CV LAB;  Service: Cardiovascular;  Laterality: N/A;  . UPPER EXTREMITY VENOGRAPHY N/A 05/25/2018   Procedure: UPPER EXTREMITY VENOGRAPHY - Bilateral;  Surgeon: Marty Heck, MD;  Location: [facility] CV LAB;  Service: Cardiovascular;  Laterality: N/A;       Family History  Problem Relation Age of Onset  . Diabetes Mellitus II Other   . Diabetes Father   . Renal Disease Father        ESRD    Social History   Tobacco Use  . Smoking status: Never Smoker  . Smokeless tobacco: Never Used  Vaping Use  . Vaping Use: Never used  Substance Use Topics  . Alcohol use: No  . Drug use: No    Home Medications Prior to Admission medications   Medication Sig Start Date End Date  Taking? Authorizing Provider  albuterol (VENTOLIN HFA) 108 (90 Base) MCG/ACT inhaler Inhale 2 puffs into the lungs every 6 (six) hours as needed for wheezing or shortness of breath. 02/18/20   Thurnell Lose, MD  Blood Glucose Monitoring Suppl ([place]) w/Device KIT 1 Bag by Does not apply route 3 (three) times daily before meals. 05/02/20   Kerin Perna, NP  calcitRIOL (ROCALTROL) 0.5 MCG capsule Take 5 capsules (2.5 mcg total) by mouth every Monday, Wednesday, and Friday with hemodialysis. 10/26/18   Hongalgi, Lenis Dickinson, MD  cinacalcet (SENSIPAR) 60 MG tablet SMARTSIG:2 Tablet(s) By Mouth Every Evening 04/24/20   [provider]  dextrose 50 % solution Dextrose 50% 03/11/20 03/10/21  [provider]  glucose blood (ONETOUCH VERIO) test strip Check blood sugars three times daily 07/01/20   Kerin Perna, NP  insulin glargine (LANTUS SOLOSTAR) 100 UNIT/ML Solostar Pen Inject 12 Units into the skin daily. 02/13/20   Kerin Perna, NP  Lancets [facility] DELICA PLUS [id number]) MISC Apply topically. 05/02/20   [provider]  Methoxy PEG-Epoetin Beta (MIRCERA IJ) Mircera 04/19/20 04/18/21  [provider]  mupirocin ointment (BACTROBAN) 2 % Apply to wound twice a day. 05/01/20   McDonald, [name] R, DPM  NOVOLOG 100 UNIT/ML injection INJECT 6 UNITS SUBCUTANEOUSLY TWICE DAILY WITH A MEAL 07/19/20   Kerin Perna, NP  oxyCODONE (OXY IR/ROXICODONE) 5 MG immediate release tablet Take 5 mg by mouth 2 (two) times daily as needed. 02/01/20   [provider]  sevelamer carbonate (RENVELA) 800 MG tablet Take 2,400-4,000 mg by mouth See admin instructions. Take 5 tablets with meals then take 3 tablets with snacks 04/27/19   [provider]    Allergies    Patient has no known allergies.  Review of Systems   Review of Systems  Unable to perform ROS: Acuity of condition    Physical Exam Updated Vital Signs BP (!) 192/101    Pulse (!) 107   Resp 20   Ht '5\' 7"'  (1.702 m)   Wt 67 kg   SpO2 100%   BMI 23.13 kg/m   Physical Exam Vitals and nursing note reviewed.  Constitutional:      General: He is in acute distress.     Appearance: He is ill-appearing. He is not diaphoretic.  HENT:     Head: Normocephalic and atraumatic.     Right Ear: External ear normal.     Left Ear: External ear normal.     Mouth/Throat:  Mouth: Mucous membranes are dry.  Eyes:     General: No scleral icterus.    Conjunctiva/sclera: Conjunctivae normal.  Neck:     Vascular: No JVD.  Cardiovascular:     Rate and Rhythm: Bradycardia present. Rhythm irregular.     Pulses:          Carotid pulses are 1+ on the right side and 1+ on the left side.      Radial pulses are 0 on the right side and 0 on the left side.     Comments: Weak carotid pulses Pulmonary:     Effort: Tachypnea present. No accessory muscle usage, prolonged expiration, respiratory distress or retractions.     Breath sounds: No stridor.     Comments: Equal chest rise. No increased work of breathing. Abdominal:     General: There is no distension.     Palpations: Abdomen is soft.     Tenderness: There is no abdominal tenderness. There is no guarding or rebound.  Musculoskeletal:     Cervical back: Normal range of motion.     Comments: Moves all extremities equally and without difficulty.  Skin:    General: Skin is cool and moist.     Capillary Refill: Capillary refill takes less than 2 seconds.     Coloration: Skin is pale.  Neurological:     Mental Status: He is lethargic.     GCS: GCS eye subscore is 4. GCS verbal subscore is 5. GCS motor subscore is 6.     Comments: Speech is clear and goal oriented.  Psychiatric:        Mood and Affect: Mood normal.     ED Results / Procedures / Treatments   Labs (all labs ordered are listed, but only abnormal results are displayed) Labs Reviewed  CBC WITH DIFFERENTIAL/PLATELET - Abnormal; Notable for the  following components:      Result Value   WBC 14.3 (*)    RBC 3.85 (*)    Hemoglobin 11.6 (*)    HCT 38.5 (*)    RDW 15.7 (*)    Neutro Abs 10.3 (*)    Eosinophils Absolute 0.8 (*)    Abs Immature Granulocytes 0.09 (*)    All other components within normal limits  COMPREHENSIVE METABOLIC PANEL - Abnormal; Notable for the following components:   Sodium 130 (*)    Potassium >7.5 (*)    CO2 12 (*)    Glucose, Bld 307 (*)    BUN 104 (*)    Creatinine, Ser 8.76 (*)    Albumin 2.9 (*)    Alkaline Phosphatase 151 (*)    GFR, Estimated 7 (*)    Anion gap 16 (*)    All other components within normal limits  CBG MONITORING, ED - Abnormal; Notable for the following components:   Glucose-Capillary 295 (*)    All other components within normal limits  RESPIRATORY PANEL BY RT PCR (FLU A&B, COVID)  CULTURE, BLOOD (ROUTINE X 2)  CULTURE, BLOOD (ROUTINE X 2)  HIV ANTIBODY (ROUTINE TESTING W REFLEX)  CBC  CREATININE, SERUM  COMPREHENSIVE METABOLIC PANEL  PROCALCITONIN  PROTIME-INR  I-STAT CHEM 8, ED    EKG          EKG Interpretation  Date/Time:  Monday July 22 2020 04:28:46 EDT Ventricular Rate:  93 PR Interval:    QRS Duration: 156 QT Interval:  429 QTC Calculation: 508 R Axis:   119 Text Interpretation: Atrial fibrillation Nonspecific intraventricular conduction delay Minimal  ST depression, inferior leads Confirmed by Addison Lank [phone]) on 07/22/2020 5:37:53 AM    Procedures .Critical Care Performed by: Abigail Butts, PA-C Authorized by: Abigail Butts, PA-C   Critical care provider statement:    Critical care time (minutes):  45   Critical care time was exclusive of:  Separately billable procedures and treating other patients and teaching time   Critical care was necessary to treat or prevent imminent or life-threatening deterioration of the following conditions:  Metabolic crisis   Critical care was time spent personally by me on the  following activities:  Discussions with consultants, evaluation of patient's response to treatment, examination of patient, ordering and performing treatments and interventions, ordering and review of laboratory studies, ordering and review of radiographic studies, pulse oximetry, re-evaluation of patient's condition, obtaining history from patient or surrogate and review of old charts   I assumed direction of critical care for this patient from another provider in my specialty: no     (including critical care time)  Medications Ordered in ED Medications  calcium chloride 1 g in sodium chloride 0.9 % 100 mL IVPB (has no administration in time range)  sodium bicarbonate 100 mEq in dextrose 5 % 1,000 mL infusion (has no administration in time range)  calcium chloride injection (2 g Intravenous Given 07/22/20 0552)  docusate sodium (COLACE) capsule 100 mg (has no administration in time range)  polyethylene glycol (MIRALAX / GLYCOLAX) packet 17 g (has no administration in time range)  heparin injection 5,000 Units (has no administration in time range)  pantoprazole (PROTONIX) injection 40 mg (has no administration in time range)  sodium bicarbonate injection (100 mEq Intravenous Given 07/22/20 0527)  sodium chloride flush (NS) 0.9 % injection 3 mL (has no administration in time range)  sodium chloride flush (NS) 0.9 % injection 3 mL (has no administration in time range)  0.9 %  sodium chloride infusion (has no administration in time range)  morphine 2 MG/ML injection 2 mg (has no administration in time range)  ondansetron (ZOFRAN) tablet 4 mg (has no administration in time range)    Or  ondansetron (ZOFRAN) injection 4 mg (has no administration in time range)  acetaminophen (TYLENOL) tablet 650 mg (has no administration in time range)    Or  acetaminophen (TYLENOL) suppository 650 mg (has no administration in time range)  insulin aspart (novoLOG) injection 5 Units (5 Units Intravenous Given  07/22/20 0450)  insulin aspart (novoLOG) injection 5 Units (5 Units Intravenous Given 07/22/20 0430)  albuterol (PROVENTIL) (2.5 MG/3ML) 0.083% nebulizer solution 5 mg (5 mg Nebulization Given 07/22/20 0456)  sodium bicarbonate 1 mEq/mL injection (  Given 07/22/20 0429)    ED Course  I have reviewed the triage vital signs and the nursing notes.  Pertinent labs & imaging results that were available during my care of the patient were reviewed by me and considered in my medical decision making (see chart for details).  Clinical Course as of Jul 22 542  Mon Jul 22, 2020  0447 3 amps bicarb 3g CaCl 10U insulin IV Albuterol 56m neb   [HM]    Clinical Course User Index [HM] Bijal Siglin, HGwenlyn Perking  MDM Rules/Calculators/A&P                           Patient arrives to the emergency department critically ill.  Pale cool and clammy.  Thready carotid pulses, hypotensive.  EKG by EMS shows sine wave.  Patient has  known history of dialysis.  Difficult IV access.  IO placed by myself.  Patient given 3 A of bicarb, 3 g of calcium chloride, 10 units of insulin IV and albuterol.  Fluid bolus.  EKG with significant provement after initial 2 A of bicarb and calcium chloride however QRS continues to widen and additional medications given.  Sodium bicarb and calcium gluconate drips ordered.   Discussed with Dr. Soyla Murphy, nephrology about emergent dialysis.  Patient will need a bed upstairs.  Discussed with Dr. Prudencio Burly, PCCM who accepts the patient.  Patient assigned bed [id number].  4:55AM Dr. Soyla Murphy at bedside.  Patient being taken to the unit for emergent dialysis.  The patient was discussed with and evaluated by Dr. Leonette Monarch who agrees with the treatment plan.  Final Clinical Impression(s) / ED Diagnoses Final diagnoses:  Shortness of breath  Hyperkalemia  Altered mental status, unspecified altered mental status type    Rx / DC Orders ED Discharge Orders    None       Albana Saperstein,  Gwenlyn Perking 07/22/20 0545    Fatima Blank, MD 07/23/20 [phone]

## 2020-07-22 NOTE — Care Management Obs Status (Signed)
[facility] NOTIFICATION   Patient Details  Name: MCKOY BHAKTA MRN: [id number] Date of Birth: 16-Mar-1981   Medicare Observation Status Notification Given:  Yes    Ninfa Meeker, RN 07/22/2020, 2:20 PM

## 2020-07-22 NOTE — Progress Notes (Addendum)
Pharmacy Antibiotic Note  [name] is a 39 y.o. male admitted on 07/22/2020 with hyperkalemia.  Pharmacy has been consulted for Vancomycin dosing. Pt has been on vancomycin at HD for osteomyelitis.   Vancomycin dose at [facility] 750 mg MWF.  Scheduled to be on through 11/12 for osteomyelitis.  Vanc random this am is 12, ok to redose after dialysis today  Plan: Vancomycin 750 mg MWF through 11/12.  Monitor HD schedule  Height: 5\' 7"  (170.2 cm) Weight: 67 kg (147 lb 11.3 oz) IBW/kg (Calculated) : 66.1  Temp (24hrs), Avg:97.7 F (36.5 C), Min:97.7 F (36.5 C), Max:97.7 F (36.5 C)  Recent Labs  Lab 07/22/20 0435 07/22/20 0654 07/22/20 0656  WBC 14.3*  --   --   CREATININE 8.76*  --  6.65*  VANCORANDOM  --  12  --     Estimated Creatinine Clearance: 13.8 mL/min (A) (by C-G formula based on SCr of 6.65 mg/dL (H)).    No Known Allergies  Alanda Slim, PharmD, [facility] Clinical Pharmacist Please see AMION for all Pharmacists' Contact Phone Numbers 07/22/2020, 8:39 AM

## 2020-07-22 NOTE — Telephone Encounter (Signed)
Concern has been followed up with by Practice Administrator. Nat Christen, CMA

## 2020-07-22 NOTE — Telephone Encounter (Signed)
Attempted to call. Went to [employer]. Will reattempt later today.

## 2020-07-22 NOTE — Progress Notes (Signed)
Pharmacy Antibiotic Note  [name] is a 39 y.o. male admitted on 07/22/2020 with hyperkalemia.  Pharmacy has been consulted for Vancomycin dosing. Pt has been on vancomycin at HD for osteomyelitis.   Plan: -Will check a random vancomycin level  -Will need to f/u with HD center to find out vancomycin dose  Height: 5\' 7"  (170.2 cm) Weight: 67 kg (147 lb 11.3 oz) IBW/kg (Calculated) : 66.1  No data recorded.  Recent Labs  Lab 07/22/20 0435  WBC 14.3*  CREATININE 8.76*    Estimated Creatinine Clearance: 10.5 mL/min (A) (by C-G formula based on SCr of 8.76 mg/dL (H)).    No Known Allergies  [name], PharmD, BCPS Clinical Pharmacist Phone: [phone]

## 2020-07-22 NOTE — Discharge Instructions (Signed)
Hyperkalemia Hyperkalemia occurs when the level of potassium in your blood is too high. Potassium is an important nutrient that helps the muscles and nerves function normally. It affects how the heart works, and it helps keep fluids and minerals balanced in the body. If there is too much potassium in your blood, it can affect your heart's ability to function normally. Potassium is normally removed (excreted) from the body by the kidneys. Hyperkalemia can result from various conditions. It can range from mild to severe. What are the causes? This condition may be caused by:  Taking in too much potassium. You can do this by: ? Using salt substitutes. They contain large amounts of potassium. ? Taking potassium supplements. ? Eating foods that are high in potassium.  Excreting too little potassium. This can happen if: ? Your kidneys are not working properly. Kidney (renal) disease, including short-term or long-term renal failure, is a common cause of hyperkalemia. ? You are taking medicines that lower your excretion of potassium. ? You have Addison's disease. ? You have a urinary tract blockage, such as kidney stones. ? You are on treatment to mechanically clean your blood (dialysis) and you skip a treatment.  Releasing a high amount of potassium from your cells into your blood. This can happen with: ? Injury to muscles (rhabdomyolysis) or other tissues. Most potassium is stored in your muscles. ? Severe burns or infections. ? Acidic blood plasma (acidosis). Acidosis can result from many diseases, such as uncontrolled diabetes. What increases the risk? The following factors may make you more likely to develop this condition:  Kidney disease. This puts you at the highest risk.  Addison's disease. This is a condition where the adrenal glands do not produce enough hormones.  Alcoholism or heavy drug use.  Using certain blood pressure medicines, such as ACE inhibitors, angiotensin II receptor  blockers (ARBs), or potassium-sparing diuretics such as spironolactone.  Severe injury or burn. What are the signs or symptoms? In many cases, there are no symptoms. However, when your potassium level becomes high enough, you may have symptoms such as:  An irregular or very slow heartbeat.  Nausea.  Tiredness (fatigue).  Confusion.  Tingling of your skin or numbness of your hands or feet.  Muscle cramps.  Muscle weakness.  Not being able to move (paralysis). How is this diagnosed? This condition may be diagnosed based on:  Your symptoms and medical history. Your health care provider will ask about your use of prescription and non-prescription drugs.  A physical exam.  Blood tests.  An electrocardiogram (ECG). How is this treated? Treatment depends on the cause and severity of your condition. Treatment may need to be done in the hospital setting. Treatment may include:  IV glucose (sugar) along with insulin to shift potassium out of your blood and into your cells.  A medicine called albuterol to shift potassium out of your blood and into your cells.  Medicines to remove the potassium from your body.  Dialysis to remove the potassium from your body.  Calcium to protect your heart from the effects of high potassium, such as irregular rhythms (arrhythmias). Follow these instructions at home:   Take over-the-counter and prescription medicines only as told by your health care provider.  Do not take any supplements, natural products, herbs, or vitamins without reviewing them with your health care provider. Certain supplements and natural food products contain high amounts of potassium.  Limit your alcohol intake as told by your health care provider.  Do not use  drugs. If you need help quitting, ask your health care provider.  If you have kidney disease, you may need to follow a low-potassium diet. A dietitian can help you learn which foods have high or low amounts of  potassium.  Keep all follow-up visits as told by your health care provider. This is important. Contact a health care provider if you:  Have an irregular or very slow heartbeat.  Feel light-headed.  Feel weak.  Are nauseous.  Have tingling or numbness in your hands or feet. Get help right away if you:  Have shortness of breath.  Have chest pain or discomfort.  Pass out.  Have muscle paralysis. Summary  Hyperkalemia occurs when the level of potassium in your blood is too high.  This condition may be caused by taking in too much potassium, excreting too little potassium, or releasing a high amount of potassium from your cells into your blood.  Hyperkalemia can result from many underlying conditions, especially chronic kidney disease, or from taking certain medicines.  Treatment of hyperkalemia may include medicine to shift potassium out of your blood and into your cells or to remove the potassium from your body.  If you have kidney disease, you may need to follow a low-potassium diet. A dietitian can help you learn which foods have high or low amounts of potassium. This information is not intended to replace advice given to you by your health care provider. Make sure you discuss any questions you have with your health care provider. Document Revised: 08/30/2017 Document Reviewed: 08/30/2017 Elsevier Patient Education  [place].

## 2020-07-23 ENCOUNTER — Telehealth: Payer: Self-pay

## 2020-07-23 ENCOUNTER — Other Ambulatory Visit: Payer: Self-pay | Admitting: *Deleted

## 2020-07-23 DIAGNOSIS — Z992 Dependence on renal dialysis: Secondary | ICD-10-CM

## 2020-07-23 DIAGNOSIS — I10 Essential (primary) hypertension: Secondary | ICD-10-CM | POA: Diagnosis not present

## 2020-07-23 DIAGNOSIS — N186 End stage renal disease: Secondary | ICD-10-CM

## 2020-07-23 LAB — HEMOGLOBIN A1C
Hgb A1c MFr Bld: 7.5 % — ABNORMAL HIGH (ref 4.8–5.6)
Mean Plasma Glucose: 169 mg/dL

## 2020-07-23 LAB — HEPATITIS B SURFACE ANTIBODY, QUANTITATIVE: Hep B S AB Quant (Post): 38.8 m[IU]/mL (ref >9.9)

## 2020-07-23 NOTE — Telephone Encounter (Signed)
Transition Care Management Unsuccessful Follow-up Telephone Call  Date of discharge and from where:  07/22/2020, [facility]  Attempts:  1st Attempt  Reason for unsuccessful TCM follow-up call:  Left voice message - call placed to patient, # [phone], message left with call back requested to this CM.  Patient needs to schedule follow up appointment with PCP

## 2020-07-24 ENCOUNTER — Telehealth: Payer: Self-pay

## 2020-07-24 DIAGNOSIS — D509 Iron deficiency anemia, unspecified: Secondary | ICD-10-CM | POA: Diagnosis not present

## 2020-07-24 DIAGNOSIS — Z992 Dependence on renal dialysis: Secondary | ICD-10-CM | POA: Diagnosis not present

## 2020-07-24 DIAGNOSIS — I10 Essential (primary) hypertension: Secondary | ICD-10-CM | POA: Diagnosis not present

## 2020-07-24 DIAGNOSIS — N2581 Secondary hyperparathyroidism of renal origin: Secondary | ICD-10-CM | POA: Diagnosis not present

## 2020-07-24 DIAGNOSIS — T8249XA Other complication of vascular dialysis catheter, initial encounter: Secondary | ICD-10-CM | POA: Diagnosis not present

## 2020-07-24 DIAGNOSIS — R739 Hyperglycemia, unspecified: Secondary | ICD-10-CM | POA: Diagnosis not present

## 2020-07-24 DIAGNOSIS — E1022 Type 1 diabetes mellitus with diabetic chronic kidney disease: Secondary | ICD-10-CM | POA: Diagnosis not present

## 2020-07-24 DIAGNOSIS — A499 Bacterial infection, unspecified: Secondary | ICD-10-CM | POA: Diagnosis not present

## 2020-07-24 DIAGNOSIS — D688 Other specified coagulation defects: Secondary | ICD-10-CM | POA: Diagnosis not present

## 2020-07-24 DIAGNOSIS — M869 Osteomyelitis, unspecified: Secondary | ICD-10-CM | POA: Diagnosis not present

## 2020-07-24 DIAGNOSIS — N186 End stage renal disease: Secondary | ICD-10-CM | POA: Diagnosis not present

## 2020-07-24 NOTE — Telephone Encounter (Signed)
Transition Care Management Follow-up Telephone Call  Date of discharge and from where: 07/22/2020, [facility]   How have you been since you were released from the hospital? He said that he could not talk, he was at treatment now and he would call back.  He needs to schedule follow up with PCP.

## 2020-07-24 NOTE — Discharge Summary (Signed)
Physician Discharge Summary  Patient ID: [name] MRN: [id number] DOB/AGE: 41-Apr-1982 41 y.o.  Admit date: 07/22/2020 Discharge date: 07/24/2020  Admission Diagnoses:  Discharge Diagnoses:  Principal Problem:   Hyperkalemia Active Problems:   Insulin dependent type 1 diabetes mellitus ([name])   ESRD on dialysis ([place])   Type 2 diabetes mellitus with foot ulcer ([place])   Volume overload   Discharged Condition: stable  Hospital Course: Patient was admitted with severe hyperkalemia due to under-dialysis from issues with his catheter (since resolved).  He received dialysis, felt well and went home to resume home dialysis schedule.  No major changes were made to his medications.   Discharge Instructions    Call MD for:  difficulty breathing, headache or visual disturbances   Complete by: As directed    Call MD for:  persistant dizziness or light-headedness   Complete by: As directed    Call MD for:  persistant nausea and vomiting   Complete by: As directed    Call MD for:  redness, tenderness, or signs of infection (pain, swelling, redness, odor or green/yellow discharge around incision site)   Complete by: As directed    Call MD for:  severe uncontrolled pain   Complete by: As directed    Call MD for:  temperature >100.4   Complete by: As directed    Diet - low sodium heart healthy   Complete by: As directed    Discharge instructions   Complete by: As directed    [name],   You were admitted to the [facility] due to very high potassium levels. This can happen with missing dialysis or shortened dialysis sessions. While here, you were received medication to help bring the potassium levels back to normal, in addition to dialysis.   Please resume your regular dialysis schedule.   Please ensure you continue taking daily insulin as prescribed by your primary care provider. Make sure to follow up with your provider in the next 1 week.   Discharge wound care:   Complete by:  As directed    Please follow wound care instructions provided to you by your foot doctor. This includes keeping the area clean and dry. Apply daily collagen dressing with dry clean dressing on top.   Increase activity slowly   Complete by: As directed      Allergies as of 07/22/2020   No Known Allergies     Medication List    STOP taking these medications   dextrose 50 % solution     TAKE these medications   albuterol 108 (90 Base) MCG/ACT inhaler Commonly known as: VENTOLIN HFA Inhale 2 puffs into the lungs every 6 (six) hours as needed for wheezing or shortness of breath.   calcitRIOL 0.5 MCG capsule Commonly known as: ROCALTROL Take 5 capsules (2.5 mcg total) by mouth every Monday, Wednesday, and Friday with hemodialysis.   cinacalcet 60 MG tablet Commonly known as: SENSIPAR SMARTSIG:2 Tablet(s) By Mouth Every Evening   Lantus SoloStar 100 UNIT/ML Solostar Pen Generic drug: insulin glargine Inject 12 Units into the skin daily.   MIRCERA IJ Mircera   mupirocin ointment 2 % Commonly known as: Bactroban Apply to wound twice a day.   NovoLOG 100 UNIT/ML injection Generic drug: insulin aspart INJECT 6 UNITS SUBCUTANEOUSLY TWICE DAILY WITH A MEAL   OneTouch Delica Plus [id number] Misc Apply topically.   OneTouch Verio Flex System w/Device Kit 1 Bag by Does not apply route 3 (three) times daily before meals.  OneTouch Verio test strip Generic drug: glucose blood Check blood sugars three times daily   oxyCODONE 5 MG immediate release tablet Commonly known as: Oxy IR/ROXICODONE Take 5 mg by mouth 2 (two) times daily as needed.   sevelamer carbonate 800 MG tablet Commonly known as: RENVELA Take 2,400-4,000 mg by mouth See admin instructions. Take 5 tablets with meals then take 3 tablets with snacks            Discharge Care Instructions  (From admission, onward)         Start     Ordered   07/22/20 0000  Discharge wound care:       Comments: Please  follow wound care instructions provided to you by your foot doctor. This includes keeping the area clean and dry. Apply daily collagen dressing with dry clean dressing on top.   07/22/20 1411           Signed: Candee Furbish 07/24/2020, 5:58 PM

## 2020-07-25 DIAGNOSIS — I10 Essential (primary) hypertension: Secondary | ICD-10-CM | POA: Diagnosis not present

## 2020-07-26 DIAGNOSIS — D509 Iron deficiency anemia, unspecified: Secondary | ICD-10-CM | POA: Diagnosis not present

## 2020-07-26 DIAGNOSIS — N2581 Secondary hyperparathyroidism of renal origin: Secondary | ICD-10-CM | POA: Diagnosis not present

## 2020-07-26 DIAGNOSIS — D688 Other specified coagulation defects: Secondary | ICD-10-CM | POA: Diagnosis not present

## 2020-07-26 DIAGNOSIS — Z992 Dependence on renal dialysis: Secondary | ICD-10-CM | POA: Diagnosis not present

## 2020-07-26 DIAGNOSIS — A499 Bacterial infection, unspecified: Secondary | ICD-10-CM | POA: Diagnosis not present

## 2020-07-26 DIAGNOSIS — R739 Hyperglycemia, unspecified: Secondary | ICD-10-CM | POA: Diagnosis not present

## 2020-07-26 DIAGNOSIS — I10 Essential (primary) hypertension: Secondary | ICD-10-CM | POA: Diagnosis not present

## 2020-07-26 DIAGNOSIS — N186 End stage renal disease: Secondary | ICD-10-CM | POA: Diagnosis not present

## 2020-07-26 DIAGNOSIS — T8249XA Other complication of vascular dialysis catheter, initial encounter: Secondary | ICD-10-CM | POA: Diagnosis not present

## 2020-07-26 DIAGNOSIS — M869 Osteomyelitis, unspecified: Secondary | ICD-10-CM | POA: Diagnosis not present

## 2020-07-26 DIAGNOSIS — E1022 Type 1 diabetes mellitus with diabetic chronic kidney disease: Secondary | ICD-10-CM | POA: Diagnosis not present

## 2020-07-27 DIAGNOSIS — I10 Essential (primary) hypertension: Secondary | ICD-10-CM | POA: Diagnosis not present

## 2020-07-27 LAB — CULTURE, BLOOD (ROUTINE X 2)
Culture: NO GROWTH
Culture: NO GROWTH
Special Requests: ADEQUATE
Special Requests: ADEQUATE

## 2020-07-28 DIAGNOSIS — I10 Essential (primary) hypertension: Secondary | ICD-10-CM | POA: Diagnosis not present

## 2020-07-28 DIAGNOSIS — N186 End stage renal disease: Secondary | ICD-10-CM | POA: Diagnosis not present

## 2020-07-28 DIAGNOSIS — I129 Hypertensive chronic kidney disease with stage 1 through stage 4 chronic kidney disease, or unspecified chronic kidney disease: Secondary | ICD-10-CM | POA: Diagnosis not present

## 2020-07-28 DIAGNOSIS — Z992 Dependence on renal dialysis: Secondary | ICD-10-CM | POA: Diagnosis not present

## 2020-07-29 DIAGNOSIS — T8249XA Other complication of vascular dialysis catheter, initial encounter: Secondary | ICD-10-CM | POA: Diagnosis not present

## 2020-07-29 DIAGNOSIS — D509 Iron deficiency anemia, unspecified: Secondary | ICD-10-CM | POA: Diagnosis not present

## 2020-07-29 DIAGNOSIS — N186 End stage renal disease: Secondary | ICD-10-CM | POA: Diagnosis not present

## 2020-07-29 DIAGNOSIS — N2581 Secondary hyperparathyroidism of renal origin: Secondary | ICD-10-CM | POA: Diagnosis not present

## 2020-07-29 DIAGNOSIS — E1022 Type 1 diabetes mellitus with diabetic chronic kidney disease: Secondary | ICD-10-CM | POA: Diagnosis not present

## 2020-07-29 DIAGNOSIS — Z992 Dependence on renal dialysis: Secondary | ICD-10-CM | POA: Diagnosis not present

## 2020-07-29 DIAGNOSIS — R739 Hyperglycemia, unspecified: Secondary | ICD-10-CM | POA: Diagnosis not present

## 2020-07-29 DIAGNOSIS — T84398A Other mechanical complication of other bone devices, implants and grafts, initial encounter: Secondary | ICD-10-CM | POA: Diagnosis not present

## 2020-07-29 DIAGNOSIS — I10 Essential (primary) hypertension: Secondary | ICD-10-CM | POA: Diagnosis not present

## 2020-07-29 DIAGNOSIS — M869 Osteomyelitis, unspecified: Secondary | ICD-10-CM | POA: Diagnosis not present

## 2020-07-29 DIAGNOSIS — D688 Other specified coagulation defects: Secondary | ICD-10-CM | POA: Diagnosis not present

## 2020-07-30 ENCOUNTER — Ambulatory Visit (INDEPENDENT_AMBULATORY_CARE_PROVIDER_SITE_OTHER): Payer: Medicare HMO | Admitting: Podiatry

## 2020-07-30 ENCOUNTER — Other Ambulatory Visit: Payer: Self-pay

## 2020-07-30 DIAGNOSIS — L97524 Non-pressure chronic ulcer of other part of left foot with necrosis of bone: Secondary | ICD-10-CM | POA: Diagnosis not present

## 2020-07-30 DIAGNOSIS — I10 Essential (primary) hypertension: Secondary | ICD-10-CM | POA: Diagnosis not present

## 2020-07-30 NOTE — Progress Notes (Signed)
  Subjective:  Patient ID: [name], male    DOB: 04-03-1981,  MRN: [id number]  No chief complaint on file.  39 y.o. male presents for wound care. Tissue paper noted in the wound today, but patient did have a dressing on. Still having fistula issus and issues getting HD, but better per patient.  Objective:  Physical Exam: Wound Location: left submet 5 Wound Measurement: 4x5 post-debridement Wound Base: Granular with exposed bone Peri-wound:  Macerated, with tissue paper stuck to the wound. Exudate: Moderate amount Serosanguinous exudate wound without warmth, erythema, signs of acute infection  Assessment:   1. 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  Ulcer left midfoot -Debrided as below -Wound remains clinically uninfected despite known osteomyelitis -Ordered wound care supplies for patient. Patient advised to change daily with collagen and sterile gauze.  Procedure: Excisional Debridement of Wound Indication: Removal of non-viable soft tissue from the wound to promote healing.  Anesthesia: none Pre-Debridement Wound Measurements: 4 cm x 4 cm x 0.5 cm  Post-Debridement Wound Measurements: 4 cm x 5 cm x 0.5 cm  Type of Debridement: Sharp Excisional Tissue Removed: Non-viable soft tissue Instrumentation: 15 blade and tissue nipper Depth of Debridement: subcutaneous tissue. Technique: Sharp excisional debridement to bleeding, viable wound base.  Dressing: Dry, sterile, compression dressing. Disposition: Patient tolerated procedure well. Patient to return in 1 week for follow-up.     Return in about 2 weeks (around 08/13/2020).

## 2020-07-31 DIAGNOSIS — E1022 Type 1 diabetes mellitus with diabetic chronic kidney disease: Secondary | ICD-10-CM | POA: Diagnosis not present

## 2020-07-31 DIAGNOSIS — R739 Hyperglycemia, unspecified: Secondary | ICD-10-CM | POA: Diagnosis not present

## 2020-07-31 DIAGNOSIS — N186 End stage renal disease: Secondary | ICD-10-CM | POA: Diagnosis not present

## 2020-07-31 DIAGNOSIS — M869 Osteomyelitis, unspecified: Secondary | ICD-10-CM | POA: Diagnosis not present

## 2020-07-31 DIAGNOSIS — D509 Iron deficiency anemia, unspecified: Secondary | ICD-10-CM | POA: Diagnosis not present

## 2020-07-31 DIAGNOSIS — T84398A Other mechanical complication of other bone devices, implants and grafts, initial encounter: Secondary | ICD-10-CM | POA: Diagnosis not present

## 2020-07-31 DIAGNOSIS — T8249XA Other complication of vascular dialysis catheter, initial encounter: Secondary | ICD-10-CM | POA: Diagnosis not present

## 2020-07-31 DIAGNOSIS — D688 Other specified coagulation defects: Secondary | ICD-10-CM | POA: Diagnosis not present

## 2020-07-31 DIAGNOSIS — N2581 Secondary hyperparathyroidism of renal origin: Secondary | ICD-10-CM | POA: Diagnosis not present

## 2020-07-31 DIAGNOSIS — Z992 Dependence on renal dialysis: Secondary | ICD-10-CM | POA: Diagnosis not present

## 2020-07-31 DIAGNOSIS — I10 Essential (primary) hypertension: Secondary | ICD-10-CM | POA: Diagnosis not present

## 2020-08-01 DIAGNOSIS — N2581 Secondary hyperparathyroidism of renal origin: Secondary | ICD-10-CM | POA: Diagnosis not present

## 2020-08-01 DIAGNOSIS — T8249XA Other complication of vascular dialysis catheter, initial encounter: Secondary | ICD-10-CM | POA: Diagnosis not present

## 2020-08-01 DIAGNOSIS — D509 Iron deficiency anemia, unspecified: Secondary | ICD-10-CM | POA: Diagnosis not present

## 2020-08-01 DIAGNOSIS — T82898A Other specified complication of vascular prosthetic devices, implants and grafts, initial encounter: Secondary | ICD-10-CM | POA: Diagnosis not present

## 2020-08-01 DIAGNOSIS — D688 Other specified coagulation defects: Secondary | ICD-10-CM | POA: Diagnosis not present

## 2020-08-01 DIAGNOSIS — E1022 Type 1 diabetes mellitus with diabetic chronic kidney disease: Secondary | ICD-10-CM | POA: Diagnosis not present

## 2020-08-01 DIAGNOSIS — I10 Essential (primary) hypertension: Secondary | ICD-10-CM | POA: Diagnosis not present

## 2020-08-01 DIAGNOSIS — Z992 Dependence on renal dialysis: Secondary | ICD-10-CM | POA: Diagnosis not present

## 2020-08-01 DIAGNOSIS — N186 End stage renal disease: Secondary | ICD-10-CM | POA: Diagnosis not present

## 2020-08-01 DIAGNOSIS — M869 Osteomyelitis, unspecified: Secondary | ICD-10-CM | POA: Diagnosis not present

## 2020-08-01 DIAGNOSIS — T84398A Other mechanical complication of other bone devices, implants and grafts, initial encounter: Secondary | ICD-10-CM | POA: Diagnosis not present

## 2020-08-01 DIAGNOSIS — R739 Hyperglycemia, unspecified: Secondary | ICD-10-CM | POA: Diagnosis not present

## 2020-08-02 DIAGNOSIS — Z992 Dependence on renal dialysis: Secondary | ICD-10-CM | POA: Diagnosis not present

## 2020-08-02 DIAGNOSIS — E1022 Type 1 diabetes mellitus with diabetic chronic kidney disease: Secondary | ICD-10-CM | POA: Diagnosis not present

## 2020-08-02 DIAGNOSIS — T84398A Other mechanical complication of other bone devices, implants and grafts, initial encounter: Secondary | ICD-10-CM | POA: Diagnosis not present

## 2020-08-02 DIAGNOSIS — N2581 Secondary hyperparathyroidism of renal origin: Secondary | ICD-10-CM | POA: Diagnosis not present

## 2020-08-02 DIAGNOSIS — I10 Essential (primary) hypertension: Secondary | ICD-10-CM | POA: Diagnosis not present

## 2020-08-02 DIAGNOSIS — N186 End stage renal disease: Secondary | ICD-10-CM | POA: Diagnosis not present

## 2020-08-02 DIAGNOSIS — D509 Iron deficiency anemia, unspecified: Secondary | ICD-10-CM | POA: Diagnosis not present

## 2020-08-02 DIAGNOSIS — D688 Other specified coagulation defects: Secondary | ICD-10-CM | POA: Diagnosis not present

## 2020-08-02 DIAGNOSIS — R739 Hyperglycemia, unspecified: Secondary | ICD-10-CM | POA: Diagnosis not present

## 2020-08-02 DIAGNOSIS — M869 Osteomyelitis, unspecified: Secondary | ICD-10-CM | POA: Diagnosis not present

## 2020-08-02 DIAGNOSIS — T8249XA Other complication of vascular dialysis catheter, initial encounter: Secondary | ICD-10-CM | POA: Diagnosis not present

## 2020-08-03 DIAGNOSIS — I10 Essential (primary) hypertension: Secondary | ICD-10-CM | POA: Diagnosis not present

## 2020-08-04 DIAGNOSIS — I10 Essential (primary) hypertension: Secondary | ICD-10-CM | POA: Diagnosis not present

## 2020-08-05 DIAGNOSIS — R739 Hyperglycemia, unspecified: Secondary | ICD-10-CM | POA: Diagnosis not present

## 2020-08-05 DIAGNOSIS — D688 Other specified coagulation defects: Secondary | ICD-10-CM | POA: Diagnosis not present

## 2020-08-05 DIAGNOSIS — N2581 Secondary hyperparathyroidism of renal origin: Secondary | ICD-10-CM | POA: Diagnosis not present

## 2020-08-05 DIAGNOSIS — D509 Iron deficiency anemia, unspecified: Secondary | ICD-10-CM | POA: Diagnosis not present

## 2020-08-05 DIAGNOSIS — E1022 Type 1 diabetes mellitus with diabetic chronic kidney disease: Secondary | ICD-10-CM | POA: Diagnosis not present

## 2020-08-05 DIAGNOSIS — N186 End stage renal disease: Secondary | ICD-10-CM | POA: Diagnosis not present

## 2020-08-05 DIAGNOSIS — T84398A Other mechanical complication of other bone devices, implants and grafts, initial encounter: Secondary | ICD-10-CM | POA: Diagnosis not present

## 2020-08-05 DIAGNOSIS — T8249XA Other complication of vascular dialysis catheter, initial encounter: Secondary | ICD-10-CM | POA: Diagnosis not present

## 2020-08-05 DIAGNOSIS — Z992 Dependence on renal dialysis: Secondary | ICD-10-CM | POA: Diagnosis not present

## 2020-08-05 DIAGNOSIS — M869 Osteomyelitis, unspecified: Secondary | ICD-10-CM | POA: Diagnosis not present

## 2020-08-05 DIAGNOSIS — I10 Essential (primary) hypertension: Secondary | ICD-10-CM | POA: Diagnosis not present

## 2020-08-06 ENCOUNTER — Ambulatory Visit: Payer: Medicare HMO | Admitting: Vascular Surgery

## 2020-08-06 ENCOUNTER — Encounter (HOSPITAL_COMMUNITY): Payer: Medicare HMO

## 2020-08-06 ENCOUNTER — Inpatient Hospital Stay (HOSPITAL_COMMUNITY): Admission: RE | Admit: 2020-08-06 | Payer: Medicare HMO | Source: Ambulatory Visit

## 2020-08-06 DIAGNOSIS — I10 Essential (primary) hypertension: Secondary | ICD-10-CM | POA: Diagnosis not present

## 2020-08-07 DIAGNOSIS — N186 End stage renal disease: Secondary | ICD-10-CM | POA: Diagnosis not present

## 2020-08-07 DIAGNOSIS — E1022 Type 1 diabetes mellitus with diabetic chronic kidney disease: Secondary | ICD-10-CM | POA: Diagnosis not present

## 2020-08-07 DIAGNOSIS — D688 Other specified coagulation defects: Secondary | ICD-10-CM | POA: Diagnosis not present

## 2020-08-07 DIAGNOSIS — T8249XA Other complication of vascular dialysis catheter, initial encounter: Secondary | ICD-10-CM | POA: Diagnosis not present

## 2020-08-07 DIAGNOSIS — D509 Iron deficiency anemia, unspecified: Secondary | ICD-10-CM | POA: Diagnosis not present

## 2020-08-07 DIAGNOSIS — M869 Osteomyelitis, unspecified: Secondary | ICD-10-CM | POA: Diagnosis not present

## 2020-08-07 DIAGNOSIS — R739 Hyperglycemia, unspecified: Secondary | ICD-10-CM | POA: Diagnosis not present

## 2020-08-07 DIAGNOSIS — Z992 Dependence on renal dialysis: Secondary | ICD-10-CM | POA: Diagnosis not present

## 2020-08-07 DIAGNOSIS — N2581 Secondary hyperparathyroidism of renal origin: Secondary | ICD-10-CM | POA: Diagnosis not present

## 2020-08-07 DIAGNOSIS — I10 Essential (primary) hypertension: Secondary | ICD-10-CM | POA: Diagnosis not present

## 2020-08-07 DIAGNOSIS — T84398A Other mechanical complication of other bone devices, implants and grafts, initial encounter: Secondary | ICD-10-CM | POA: Diagnosis not present

## 2020-08-08 ENCOUNTER — Telehealth (INDEPENDENT_AMBULATORY_CARE_PROVIDER_SITE_OTHER): Payer: Self-pay | Admitting: Primary Care

## 2020-08-08 DIAGNOSIS — I10 Essential (primary) hypertension: Secondary | ICD-10-CM | POA: Diagnosis not present

## 2020-08-08 NOTE — Telephone Encounter (Signed)
Called to request that the nurse call to give her more information on the patient's condition.  Please call to discuss at [phone]

## 2020-08-09 DIAGNOSIS — D509 Iron deficiency anemia, unspecified: Secondary | ICD-10-CM | POA: Diagnosis not present

## 2020-08-09 DIAGNOSIS — N2581 Secondary hyperparathyroidism of renal origin: Secondary | ICD-10-CM | POA: Diagnosis not present

## 2020-08-09 DIAGNOSIS — N186 End stage renal disease: Secondary | ICD-10-CM | POA: Diagnosis not present

## 2020-08-09 DIAGNOSIS — D688 Other specified coagulation defects: Secondary | ICD-10-CM | POA: Diagnosis not present

## 2020-08-09 DIAGNOSIS — T8249XA Other complication of vascular dialysis catheter, initial encounter: Secondary | ICD-10-CM | POA: Diagnosis not present

## 2020-08-09 DIAGNOSIS — M869 Osteomyelitis, unspecified: Secondary | ICD-10-CM | POA: Diagnosis not present

## 2020-08-09 DIAGNOSIS — T84398A Other mechanical complication of other bone devices, implants and grafts, initial encounter: Secondary | ICD-10-CM | POA: Diagnosis not present

## 2020-08-09 DIAGNOSIS — I10 Essential (primary) hypertension: Secondary | ICD-10-CM | POA: Diagnosis not present

## 2020-08-09 DIAGNOSIS — Z992 Dependence on renal dialysis: Secondary | ICD-10-CM | POA: Diagnosis not present

## 2020-08-09 DIAGNOSIS — E1022 Type 1 diabetes mellitus with diabetic chronic kidney disease: Secondary | ICD-10-CM | POA: Diagnosis not present

## 2020-08-09 DIAGNOSIS — R739 Hyperglycemia, unspecified: Secondary | ICD-10-CM | POA: Diagnosis not present

## 2020-08-10 DIAGNOSIS — I10 Essential (primary) hypertension: Secondary | ICD-10-CM | POA: Diagnosis not present

## 2020-08-11 DIAGNOSIS — I10 Essential (primary) hypertension: Secondary | ICD-10-CM | POA: Diagnosis not present

## 2020-08-12 DIAGNOSIS — D688 Other specified coagulation defects: Secondary | ICD-10-CM | POA: Diagnosis not present

## 2020-08-12 DIAGNOSIS — T84398A Other mechanical complication of other bone devices, implants and grafts, initial encounter: Secondary | ICD-10-CM | POA: Diagnosis not present

## 2020-08-12 DIAGNOSIS — N2581 Secondary hyperparathyroidism of renal origin: Secondary | ICD-10-CM | POA: Diagnosis not present

## 2020-08-12 DIAGNOSIS — Z992 Dependence on renal dialysis: Secondary | ICD-10-CM | POA: Diagnosis not present

## 2020-08-12 DIAGNOSIS — T8249XA Other complication of vascular dialysis catheter, initial encounter: Secondary | ICD-10-CM | POA: Diagnosis not present

## 2020-08-12 DIAGNOSIS — I10 Essential (primary) hypertension: Secondary | ICD-10-CM | POA: Diagnosis not present

## 2020-08-12 DIAGNOSIS — M869 Osteomyelitis, unspecified: Secondary | ICD-10-CM | POA: Diagnosis not present

## 2020-08-12 DIAGNOSIS — R739 Hyperglycemia, unspecified: Secondary | ICD-10-CM | POA: Diagnosis not present

## 2020-08-12 DIAGNOSIS — D509 Iron deficiency anemia, unspecified: Secondary | ICD-10-CM | POA: Diagnosis not present

## 2020-08-12 DIAGNOSIS — E1022 Type 1 diabetes mellitus with diabetic chronic kidney disease: Secondary | ICD-10-CM | POA: Diagnosis not present

## 2020-08-12 DIAGNOSIS — N186 End stage renal disease: Secondary | ICD-10-CM | POA: Diagnosis not present

## 2020-08-13 ENCOUNTER — Ambulatory Visit: Payer: Medicare HMO | Admitting: Podiatry

## 2020-08-13 DIAGNOSIS — I10 Essential (primary) hypertension: Secondary | ICD-10-CM | POA: Diagnosis not present

## 2020-08-13 NOTE — Telephone Encounter (Signed)
Please contact rebecca with [employer].

## 2020-08-14 ENCOUNTER — Telehealth (INDEPENDENT_AMBULATORY_CARE_PROVIDER_SITE_OTHER): Payer: Self-pay | Admitting: Primary Care

## 2020-08-14 DIAGNOSIS — D509 Iron deficiency anemia, unspecified: Secondary | ICD-10-CM | POA: Diagnosis not present

## 2020-08-14 DIAGNOSIS — N186 End stage renal disease: Secondary | ICD-10-CM | POA: Diagnosis not present

## 2020-08-14 DIAGNOSIS — T84398A Other mechanical complication of other bone devices, implants and grafts, initial encounter: Secondary | ICD-10-CM | POA: Diagnosis not present

## 2020-08-14 DIAGNOSIS — T8249XA Other complication of vascular dialysis catheter, initial encounter: Secondary | ICD-10-CM | POA: Diagnosis not present

## 2020-08-14 DIAGNOSIS — N2581 Secondary hyperparathyroidism of renal origin: Secondary | ICD-10-CM | POA: Diagnosis not present

## 2020-08-14 DIAGNOSIS — I10 Essential (primary) hypertension: Secondary | ICD-10-CM | POA: Diagnosis not present

## 2020-08-14 DIAGNOSIS — Z992 Dependence on renal dialysis: Secondary | ICD-10-CM | POA: Diagnosis not present

## 2020-08-14 DIAGNOSIS — D688 Other specified coagulation defects: Secondary | ICD-10-CM | POA: Diagnosis not present

## 2020-08-14 DIAGNOSIS — M869 Osteomyelitis, unspecified: Secondary | ICD-10-CM | POA: Diagnosis not present

## 2020-08-14 DIAGNOSIS — E1022 Type 1 diabetes mellitus with diabetic chronic kidney disease: Secondary | ICD-10-CM | POA: Diagnosis not present

## 2020-08-14 DIAGNOSIS — R739 Hyperglycemia, unspecified: Secondary | ICD-10-CM | POA: Diagnosis not present

## 2020-08-14 NOTE — Telephone Encounter (Signed)
[name] would like to know the pateint most recent office visit with michelle and pt last A1C also  is the pt been seen by  Wound care center. Please call becky back

## 2020-08-15 ENCOUNTER — Ambulatory Visit: Payer: Medicare HMO | Admitting: Podiatry

## 2020-08-15 DIAGNOSIS — I10 Essential (primary) hypertension: Secondary | ICD-10-CM | POA: Diagnosis not present

## 2020-08-15 NOTE — Telephone Encounter (Signed)
[name] returning your call.  No one will answer phone at office.

## 2020-08-15 NOTE — Telephone Encounter (Signed)
Left message for rebecca to call back to [phone].

## 2020-08-16 DIAGNOSIS — N186 End stage renal disease: Secondary | ICD-10-CM | POA: Diagnosis not present

## 2020-08-16 DIAGNOSIS — T84398A Other mechanical complication of other bone devices, implants and grafts, initial encounter: Secondary | ICD-10-CM | POA: Diagnosis not present

## 2020-08-16 DIAGNOSIS — D509 Iron deficiency anemia, unspecified: Secondary | ICD-10-CM | POA: Diagnosis not present

## 2020-08-16 DIAGNOSIS — I10 Essential (primary) hypertension: Secondary | ICD-10-CM | POA: Diagnosis not present

## 2020-08-16 DIAGNOSIS — E1022 Type 1 diabetes mellitus with diabetic chronic kidney disease: Secondary | ICD-10-CM | POA: Diagnosis not present

## 2020-08-16 DIAGNOSIS — M869 Osteomyelitis, unspecified: Secondary | ICD-10-CM | POA: Diagnosis not present

## 2020-08-16 DIAGNOSIS — N2581 Secondary hyperparathyroidism of renal origin: Secondary | ICD-10-CM | POA: Diagnosis not present

## 2020-08-16 DIAGNOSIS — D688 Other specified coagulation defects: Secondary | ICD-10-CM | POA: Diagnosis not present

## 2020-08-16 DIAGNOSIS — R739 Hyperglycemia, unspecified: Secondary | ICD-10-CM | POA: Diagnosis not present

## 2020-08-16 DIAGNOSIS — Z992 Dependence on renal dialysis: Secondary | ICD-10-CM | POA: Diagnosis not present

## 2020-08-16 DIAGNOSIS — T8249XA Other complication of vascular dialysis catheter, initial encounter: Secondary | ICD-10-CM | POA: Diagnosis not present

## 2020-08-16 NOTE — Telephone Encounter (Signed)
Spoke with Wells Guiles with [facility] care management team. Provided her with patients last OV and A1c(which was obtained during his hospitalization). Wells Guiles states she has attempted to reach patient for post discharge follow up and he refused her calls. She will make one last attempt. His insurance will provide Home health if patient is willing to receive. She will try reaching out to him again to see if this is something he would be interested in.

## 2020-08-17 DIAGNOSIS — I10 Essential (primary) hypertension: Secondary | ICD-10-CM | POA: Diagnosis not present

## 2020-08-18 DIAGNOSIS — D688 Other specified coagulation defects: Secondary | ICD-10-CM | POA: Diagnosis not present

## 2020-08-18 DIAGNOSIS — D509 Iron deficiency anemia, unspecified: Secondary | ICD-10-CM | POA: Diagnosis not present

## 2020-08-18 DIAGNOSIS — E1022 Type 1 diabetes mellitus with diabetic chronic kidney disease: Secondary | ICD-10-CM | POA: Diagnosis not present

## 2020-08-18 DIAGNOSIS — T8249XA Other complication of vascular dialysis catheter, initial encounter: Secondary | ICD-10-CM | POA: Diagnosis not present

## 2020-08-18 DIAGNOSIS — T84398A Other mechanical complication of other bone devices, implants and grafts, initial encounter: Secondary | ICD-10-CM | POA: Diagnosis not present

## 2020-08-18 DIAGNOSIS — R739 Hyperglycemia, unspecified: Secondary | ICD-10-CM | POA: Diagnosis not present

## 2020-08-18 DIAGNOSIS — M869 Osteomyelitis, unspecified: Secondary | ICD-10-CM | POA: Diagnosis not present

## 2020-08-18 DIAGNOSIS — Z992 Dependence on renal dialysis: Secondary | ICD-10-CM | POA: Diagnosis not present

## 2020-08-18 DIAGNOSIS — N186 End stage renal disease: Secondary | ICD-10-CM | POA: Diagnosis not present

## 2020-08-18 DIAGNOSIS — I10 Essential (primary) hypertension: Secondary | ICD-10-CM | POA: Diagnosis not present

## 2020-08-18 DIAGNOSIS — N2581 Secondary hyperparathyroidism of renal origin: Secondary | ICD-10-CM | POA: Diagnosis not present

## 2020-08-19 DIAGNOSIS — I10 Essential (primary) hypertension: Secondary | ICD-10-CM | POA: Diagnosis not present

## 2020-08-20 DIAGNOSIS — I10 Essential (primary) hypertension: Secondary | ICD-10-CM | POA: Diagnosis not present

## 2020-08-20 DIAGNOSIS — D688 Other specified coagulation defects: Secondary | ICD-10-CM | POA: Diagnosis not present

## 2020-08-20 DIAGNOSIS — T84398A Other mechanical complication of other bone devices, implants and grafts, initial encounter: Secondary | ICD-10-CM | POA: Diagnosis not present

## 2020-08-20 DIAGNOSIS — N2581 Secondary hyperparathyroidism of renal origin: Secondary | ICD-10-CM | POA: Diagnosis not present

## 2020-08-20 DIAGNOSIS — T8249XA Other complication of vascular dialysis catheter, initial encounter: Secondary | ICD-10-CM | POA: Diagnosis not present

## 2020-08-20 DIAGNOSIS — N186 End stage renal disease: Secondary | ICD-10-CM | POA: Diagnosis not present

## 2020-08-20 DIAGNOSIS — R739 Hyperglycemia, unspecified: Secondary | ICD-10-CM | POA: Diagnosis not present

## 2020-08-20 DIAGNOSIS — Z992 Dependence on renal dialysis: Secondary | ICD-10-CM | POA: Diagnosis not present

## 2020-08-20 DIAGNOSIS — E1022 Type 1 diabetes mellitus with diabetic chronic kidney disease: Secondary | ICD-10-CM | POA: Diagnosis not present

## 2020-08-20 DIAGNOSIS — M869 Osteomyelitis, unspecified: Secondary | ICD-10-CM | POA: Diagnosis not present

## 2020-08-20 DIAGNOSIS — D509 Iron deficiency anemia, unspecified: Secondary | ICD-10-CM | POA: Diagnosis not present

## 2020-08-21 DIAGNOSIS — I10 Essential (primary) hypertension: Secondary | ICD-10-CM | POA: Diagnosis not present

## 2020-08-22 DIAGNOSIS — I10 Essential (primary) hypertension: Secondary | ICD-10-CM | POA: Diagnosis not present

## 2020-08-23 DIAGNOSIS — N186 End stage renal disease: Secondary | ICD-10-CM | POA: Diagnosis not present

## 2020-08-23 DIAGNOSIS — T84398A Other mechanical complication of other bone devices, implants and grafts, initial encounter: Secondary | ICD-10-CM | POA: Diagnosis not present

## 2020-08-23 DIAGNOSIS — I10 Essential (primary) hypertension: Secondary | ICD-10-CM | POA: Diagnosis not present

## 2020-08-23 DIAGNOSIS — D688 Other specified coagulation defects: Secondary | ICD-10-CM | POA: Diagnosis not present

## 2020-08-23 DIAGNOSIS — N2581 Secondary hyperparathyroidism of renal origin: Secondary | ICD-10-CM | POA: Diagnosis not present

## 2020-08-23 DIAGNOSIS — T8249XA Other complication of vascular dialysis catheter, initial encounter: Secondary | ICD-10-CM | POA: Diagnosis not present

## 2020-08-23 DIAGNOSIS — M869 Osteomyelitis, unspecified: Secondary | ICD-10-CM | POA: Diagnosis not present

## 2020-08-23 DIAGNOSIS — E1022 Type 1 diabetes mellitus with diabetic chronic kidney disease: Secondary | ICD-10-CM | POA: Diagnosis not present

## 2020-08-23 DIAGNOSIS — R739 Hyperglycemia, unspecified: Secondary | ICD-10-CM | POA: Diagnosis not present

## 2020-08-23 DIAGNOSIS — D509 Iron deficiency anemia, unspecified: Secondary | ICD-10-CM | POA: Diagnosis not present

## 2020-08-23 DIAGNOSIS — Z992 Dependence on renal dialysis: Secondary | ICD-10-CM | POA: Diagnosis not present

## 2020-08-24 DIAGNOSIS — I10 Essential (primary) hypertension: Secondary | ICD-10-CM | POA: Diagnosis not present

## 2020-08-25 DIAGNOSIS — I10 Essential (primary) hypertension: Secondary | ICD-10-CM | POA: Diagnosis not present

## 2020-08-26 DIAGNOSIS — R739 Hyperglycemia, unspecified: Secondary | ICD-10-CM | POA: Diagnosis not present

## 2020-08-26 DIAGNOSIS — I10 Essential (primary) hypertension: Secondary | ICD-10-CM | POA: Diagnosis not present

## 2020-08-26 DIAGNOSIS — T8249XA Other complication of vascular dialysis catheter, initial encounter: Secondary | ICD-10-CM | POA: Diagnosis not present

## 2020-08-26 DIAGNOSIS — D509 Iron deficiency anemia, unspecified: Secondary | ICD-10-CM | POA: Diagnosis not present

## 2020-08-26 DIAGNOSIS — N2581 Secondary hyperparathyroidism of renal origin: Secondary | ICD-10-CM | POA: Diagnosis not present

## 2020-08-26 DIAGNOSIS — Z992 Dependence on renal dialysis: Secondary | ICD-10-CM | POA: Diagnosis not present

## 2020-08-26 DIAGNOSIS — E1022 Type 1 diabetes mellitus with diabetic chronic kidney disease: Secondary | ICD-10-CM | POA: Diagnosis not present

## 2020-08-26 DIAGNOSIS — D688 Other specified coagulation defects: Secondary | ICD-10-CM | POA: Diagnosis not present

## 2020-08-26 DIAGNOSIS — M869 Osteomyelitis, unspecified: Secondary | ICD-10-CM | POA: Diagnosis not present

## 2020-08-26 DIAGNOSIS — N186 End stage renal disease: Secondary | ICD-10-CM | POA: Diagnosis not present

## 2020-08-26 DIAGNOSIS — T84398A Other mechanical complication of other bone devices, implants and grafts, initial encounter: Secondary | ICD-10-CM | POA: Diagnosis not present

## 2020-08-27 ENCOUNTER — Ambulatory Visit (INDEPENDENT_AMBULATORY_CARE_PROVIDER_SITE_OTHER): Payer: Medicare HMO | Admitting: Podiatry

## 2020-08-27 DIAGNOSIS — Z5329 Procedure and treatment not carried out because of patient's decision for other reasons: Secondary | ICD-10-CM

## 2020-08-27 DIAGNOSIS — N186 End stage renal disease: Secondary | ICD-10-CM | POA: Diagnosis not present

## 2020-08-27 DIAGNOSIS — Z992 Dependence on renal dialysis: Secondary | ICD-10-CM | POA: Diagnosis not present

## 2020-08-27 DIAGNOSIS — I10 Essential (primary) hypertension: Secondary | ICD-10-CM | POA: Diagnosis not present

## 2020-08-27 DIAGNOSIS — I129 Hypertensive chronic kidney disease with stage 1 through stage 4 chronic kidney disease, or unspecified chronic kidney disease: Secondary | ICD-10-CM | POA: Diagnosis not present

## 2020-08-27 NOTE — Telephone Encounter (Signed)
Left message in regards to patient not wanting assistance. Any further question feel free to call [employer]

## 2020-08-27 NOTE — Progress Notes (Signed)
No show for appt. 2nd no show.

## 2020-08-28 DIAGNOSIS — N186 End stage renal disease: Secondary | ICD-10-CM | POA: Diagnosis not present

## 2020-08-28 DIAGNOSIS — I10 Essential (primary) hypertension: Secondary | ICD-10-CM | POA: Diagnosis not present

## 2020-08-28 DIAGNOSIS — D509 Iron deficiency anemia, unspecified: Secondary | ICD-10-CM | POA: Diagnosis not present

## 2020-08-28 DIAGNOSIS — N2581 Secondary hyperparathyroidism of renal origin: Secondary | ICD-10-CM | POA: Diagnosis not present

## 2020-08-28 DIAGNOSIS — D688 Other specified coagulation defects: Secondary | ICD-10-CM | POA: Diagnosis not present

## 2020-08-28 DIAGNOSIS — E1022 Type 1 diabetes mellitus with diabetic chronic kidney disease: Secondary | ICD-10-CM | POA: Diagnosis not present

## 2020-08-28 DIAGNOSIS — T8249XA Other complication of vascular dialysis catheter, initial encounter: Secondary | ICD-10-CM | POA: Diagnosis not present

## 2020-08-28 DIAGNOSIS — Z992 Dependence on renal dialysis: Secondary | ICD-10-CM | POA: Diagnosis not present

## 2020-08-29 ENCOUNTER — Ambulatory Visit (HOSPITAL_COMMUNITY)
Admission: RE | Admit: 2020-08-29 | Discharge: 2020-08-29 | Disposition: A | Discharge status: Home / Self Care | Payer: Medicare HMO | Source: Ambulatory Visit | Attending: Vascular Surgery | Admitting: Vascular Surgery

## 2020-08-29 ENCOUNTER — Other Ambulatory Visit: Payer: Self-pay

## 2020-08-29 ENCOUNTER — Ambulatory Visit (INDEPENDENT_AMBULATORY_CARE_PROVIDER_SITE_OTHER)
Admission: RE | Admit: 2020-08-29 | Discharge: 2020-08-29 | Disposition: A | Discharge status: Home / Self Care | Payer: Medicare HMO | Source: Ambulatory Visit | Attending: Vascular Surgery | Admitting: Vascular Surgery

## 2020-08-29 ENCOUNTER — Encounter: Payer: Self-pay | Admitting: Vascular Surgery

## 2020-08-29 ENCOUNTER — Ambulatory Visit (INDEPENDENT_AMBULATORY_CARE_PROVIDER_SITE_OTHER): Payer: Medicare HMO | Admitting: Vascular Surgery

## 2020-08-29 VITALS — BP 127/83 | HR 96 | Temp 97.7°F | Resp 20 | Ht 67.0 in | Wt 153.0 lb

## 2020-08-29 DIAGNOSIS — Z992 Dependence on renal dialysis: Secondary | ICD-10-CM

## 2020-08-29 DIAGNOSIS — N186 End stage renal disease: Secondary | ICD-10-CM

## 2020-08-29 DIAGNOSIS — I10 Essential (primary) hypertension: Secondary | ICD-10-CM | POA: Diagnosis not present

## 2020-08-29 NOTE — Progress Notes (Signed)
Referring Physician: Dr. Jimmy Footman  Patient name: [name] MRN: [id number] DOB: 05-18-1981 Sex: male  REASON FOR CONSULT: Hemodialysis access  HPI: [name] is a 39 y.o. 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  He presents today for placement of a new hemodialysis access.  His dialysis day is Monday Wednesday Friday.  He denies any numbness or tingling in his hands.  He currently has a open wound and ulceration at the area of his left wrist which is being managed and followed by Dr. Jimmy Footman.  He states he thinks this is improving.  He is going to see Dr. Fransico Michael regarding this again tomorrow.  Other medical problems include diabetes hypertension both of which are currently stable.  Past Medical History:  Diagnosis Date  . Diabetes mellitus without complication ([place])   . Diabetic gastroparesis ([place])   . Dialysis patient ([place])   . Hypertension   . Renal disorder    Dialysis  . Sepsis [facility])    Past Surgical History:  Procedure Laterality Date  . AMPUTATION Right 02/02/2018   Procedure: RIGHT FIFTH TOE AND METATARSAL AMPUTATION. Filetted toe flap metatarsal resection. Debridement Plantar Foot wound;  Surgeon: Evelina Bucy, DPM;  Location: [place];  Service: Podiatry;  Laterality: Right;  . AMPUTATION Left 08/20/2018   Procedure: FIFTH METATARSAL BONE BIOPSY;  Surgeon: Evelina Bucy, DPM;  Location: [place];  Service: Podiatry;  Laterality: Left;  . AMPUTATION Left 10/28/2018   Procedure: LEFT GREAT TOE AMPUTATION;  Surgeon: Evelina Bucy, DPM;  Location: [place];  Service: Podiatry;  Laterality: Left;  . APPLICATION OF WOUND VAC  02/02/2018   Procedure: APPLICATION OF WOUND VAC  Right Foot;  Surgeon: Evelina Bucy, DPM;  Location: [place];  Service: Podiatry;;  . APPLICATION OF WOUND VAC Left 10/28/2018   Procedure: APPLICATION OF WOUND VAC LEFT TOE;  Surgeon: Evelina Bucy, DPM;  Location: [place];  Service: Podiatry;  Laterality: Left;   . APPLICATION OF WOUND VAC Left 11/01/2018   Procedure: APPLICATION OF WOUND VAC;  Surgeon: Evelina Bucy, DPM;  Location: [place];  Service: Podiatry;  Laterality: Left;  . AV FISTULA PLACEMENT     left arm.  . AV FISTULA PLACEMENT Right 12/22/2016   Procedure: INSERTION OF ARTERIOVENOUS (AV) GORE-TEX GRAFT ARM;  Surgeon: Elam Dutch, MD;  Location: [facility] OR;  Service: Vascular;  Laterality: Right;  . AV FISTULA PLACEMENT Left 05/26/2018   Procedure: INSERTION OF  ARTERIOVENOUS (AV) GORE-TEX GRAFT LEFT ARM;  Surgeon: Serafina Mitchell, MD;  Location: [place];  Service: Vascular;  Laterality: Left;  . EYE SURGERY    . I & D EXTREMITY Right 10/31/2017   Procedure: IRRIGATION AND DEBRIDEMENT RIGHT FOOT;  Surgeon: Evelina Bucy, DPM;  Location: [place];  Service: Podiatry;  Laterality: Right;  . I & D EXTREMITY Left 08/20/2018   Procedure: IRRIGATION AND DEBRIDEMENT EXTREMITY WITH SECONDARY WOUND CLOSUREAND APPLICATION OF WOUND VAC LEFT FOOT;  Surgeon: Evelina Bucy, DPM;  Location: [place];  Service: Podiatry;  Laterality: Left;  . I & D EXTREMITY Left 10/20/2018   Procedure: IRRIGATION AND DEBRIDEMENT LEFT FOOT  DEBRIDEMENT LATERAL FOOT WOUND;  Surgeon: Evelina Bucy, DPM;  Location: [place];  Service: Podiatry;  Laterality: Left;  . I & D EXTREMITY Left 10/28/2018   Procedure: IRRIGATION AND DEBRIDEMENT LEFT TOE;  Surgeon: Evelina Bucy, DPM;  Location: [place];  Service:  Podiatry;  Laterality: Left;  . INSERTION OF DIALYSIS CATHETER     Right subclavian  . IR AV DIALY SHUNT INTRO NEEDLE/INTRACATH INITIAL W/PTA/IMG RIGHT Right 02/05/2018  . IR FLUORO GUIDE CV LINE RIGHT  01/31/2020  . IR THROMBECTOMY AV FISTULA W/THROMBOLYSIS/PTA INC/SHUNT/IMG LEFT Left 08/24/2018  . IR THROMBECTOMY AV FISTULA W/THROMBOLYSIS/PTA INC/SHUNT/IMG LEFT Left 01/06/2019  . IR US GUIDE VASC ACCESS LEFT  08/24/2018  . IR US GUIDE VASC ACCESS RIGHT  02/05/2018  . IR US GUIDE VASC ACCESS RIGHT  01/31/2020  . IRRIGATION AND  DEBRIDEMENT FOOT Right 10/23/2018   Procedure: Irrigation and Debridement to tendon, Left Foot;  Surgeon: Evelina Bucy, DPM;  Location: [place];  Service: Podiatry;  Laterality: Right;  . IRRIGATION AND DEBRIDEMENT FOOT Left 11/01/2018   Procedure: IRRIGATION AND DEBRIDEMENT PARTIAL WOUND CLOSURE LOCAL TISSUE TRANSFER AND FLAP ROTATION, LEFT FOOT;  Surgeon: Evelina Bucy, DPM;  Location: [place];  Service: Podiatry;  Laterality: Left;  . TRANSMETATARSAL AMPUTATION N/A 08/18/2018   Procedure: IRRIGATION AND DEBRIDEMENT OF LEFT 5TH TOE AND TRANSMETATARSAL, WITH PARTICAL LEFT 5TH TOE AND METATARSAL AMPUTATION, BONE BIOPSY, WOUND VAC APPLICATION.;  Surgeon: Evelina Bucy, DPM;  Location: [place];  Service: Podiatry;  Laterality: N/A;  . UPPER EXTREMITY VENOGRAPHY N/A 11/16/2016   Procedure: Upper Extremity Venography - Right Central;  Surgeon: Elam Dutch, MD;  Location: [place] CV LAB;  Service: Cardiovascular;  Laterality: N/A;  . UPPER EXTREMITY VENOGRAPHY N/A 05/25/2018   Procedure: UPPER EXTREMITY VENOGRAPHY - Bilateral;  Surgeon: Marty Heck, MD;  Location: [place] CV LAB;  Service: Cardiovascular;  Laterality: N/A;    Family History  Problem Relation Age of Onset  . Diabetes Mellitus II Other   . Diabetes Father   . Renal Disease Father        ESRD    SOCIAL HISTORY: Social History   Socioeconomic History  . Marital status: Single    Spouse name: Not on file  . Number of children: Not on file  . Years of education: Not on file  . Highest education level: Not on file  Occupational History  . Not on file  Tobacco Use  . Smoking status: Never Smoker  . Smokeless tobacco: Never Used  Vaping Use  . Vaping Use: Never used  Substance and Sexual Activity  . Alcohol use: No  . Drug use: No  . Sexual activity: Never  Other Topics Concern  . Not on file  Social History Narrative  . Not on file   Social Determinants of Health   Financial Resource Strain:    . Difficulty of Paying Living Expenses: Not on file  Food Insecurity:   . Worried About Charity fundraiser in the Last Year: Not on file  . Ran Out of Food in the Last Year: Not on file  Transportation Needs:   . Lack of Transportation (Medical): Not on file  . Lack of Transportation (Non-Medical): Not on file  Physical Activity:   . Days of Exercise per Week: Not on file  . Minutes of Exercise per Session: Not on file  Stress:   . Feeling of Stress : Not on file  Social Connections:   . Frequency of Communication with Friends and Family: Not on file  . Frequency of Social Gatherings with Friends and Family: Not on file  . Attends Religious Services: Not on file  . Active Member of Clubs or Organizations: Not on file  . Attends Club  or Organization Meetings: Not on file  . Marital Status: Not on file  Intimate Partner Violence:   . Fear of Current or Ex-Partner: Not on file  . Emotionally Abused: Not on file  . Physically Abused: Not on file  . Sexually Abused: Not on file    No Known Allergies  Current Outpatient Medications  Medication Sig Dispense Refill  . albuterol (VENTOLIN HFA) 108 (90 Base) MCG/ACT inhaler Inhale 2 puffs into the lungs every 6 (six) hours as needed for wheezing or shortness of breath. 6.7 g 0  . Blood Glucose Monitoring Suppl (ONETOUCH VERIO FLEX SYSTEM) w/Device KIT 1 Bag by Does not apply route 3 (three) times daily before meals. 1 kit 0  . calcitRIOL (ROCALTROL) 0.5 MCG capsule Take 5 capsules (2.5 mcg total) by mouth every Monday, Wednesday, and Friday with hemodialysis. 30 capsule 0  . cinacalcet (SENSIPAR) 60 MG tablet SMARTSIG:2 Tablet(s) By Mouth Every Evening    . glucose blood (ONETOUCH VERIO) test strip Check blood sugars three times daily 100 each 8  . Lancets (ONETOUCH DELICA PLUS [id number]) MISC Apply topically.    . Methoxy PEG-Epoetin Beta (MIRCERA IJ) Mircera    . mupirocin ointment (BACTROBAN) 2 % Apply to wound twice a day. 30 g 2   . sevelamer carbonate (RENVELA) 800 MG tablet Take 2,400-4,000 mg by mouth See admin instructions. Take 5 tablets with meals then take 3 tablets with snacks    . insulin glargine (LANTUS SOLOSTAR) 100 UNIT/ML Solostar Pen Inject 12 Units into the skin daily. (Patient not taking: Reported on 08/29/2020) 5 pen PRN  . NOVOLOG 100 UNIT/ML injection INJECT 6 UNITS SUBCUTANEOUSLY TWICE DAILY WITH A MEAL (Patient not taking: Reported on 08/29/2020) 10 mL 3  . oxyCODONE (OXY IR/ROXICODONE) 5 MG immediate release tablet Take 5 mg by mouth 2 (two) times daily as needed. (Patient not taking: Reported on 08/29/2020)     No current facility-administered medications for this visit.    ROS:   General:  No weight loss, Fever, chills  HEENT: No recent headaches, no nasal bleeding, no visual changes, no sore throat  Neurologic: No dizziness, blackouts, seizures. No recent symptoms of stroke or mini- stroke. No recent episodes of slurred speech, or temporary blindness.  Cardiac: No recent episodes of chest pain/pressure, no shortness of breath at rest.  No shortness of breath with exertion.  Denies history of atrial fibrillation or irregular heartbeat  Vascular: No history of rest pain in feet.  No history of claudication.  No history of non-healing ulcer, No history of DVT   Pulmonary: No home oxygen, no productive cough, no hemoptysis,  No asthma or wheezing  Musculoskeletal:  '[ ]'  Arthritis, '[ ]'  Low back pain,  '[ ]'  Joint pain  Hematologic:No history of hypercoagulable state.  No history of easy bleeding.  No history of anemia  Gastrointestinal: No hematochezia or melena,  No gastroesophageal reflux, no trouble swallowing  Urinary: '[X]'  chronic Kidney disease, '[X]'  on HD - '[X]'  MWF or '[ ]'  TTHS, '[ ]'  Burning with urination, '[ ]'  Frequent urination, '[ ]'  Difficulty urinating;   Skin: No rashes  Psychological: No history of anxiety,  No history of depression   Physical Examination  Vitals:   08/29/20 1000   BP: 127/83  Pulse: 96  Resp: 20  Temp: 97.7 F (36.5 C)  SpO2: 99%  Weight: 153 lb (69.4 kg)  Height: '5\' 7"'  (1.702 m)    Body mass index is 23.96 kg/m.  General:  Alert and oriented, no acute distress HEENT: Normal Neck: No JVD Pulmonary: Clear to auscultation bilaterally, right side dialysis catheter Cardiac: Regular Rate and Rhythm Skin: No rash, raised ulceration left wrist approximately 3 x 3 cm in diameter with some erythema and fluctuance Extremity Pulses:  2+ radial, brachial right arm, occluded upper arm graft bilaterally Musculoskeletal: No deformity or edema  Neurologic: Upper and lower extremity motor 5/5 and symmetric  DATA:  Patient had a duplex ultrasound today which showed normal arterial anatomy with 4 mm brachial artery.  He also had a 3 to 4 mm right upper arm cephalic vein.  I reviewed interpreted the study.  I confirmed these findings with the SonoSite at the bedside.  ASSESSMENT: 1.  Ulceration left wrist currently being followed by Dr. Jimmy Footman  2.  Patient needs long-term hemodialysis access.  It appears he may have a cephalic vein that would be usable for an upper arm AV fistula.   PLAN: Right brachiocephalic AV fistula scheduled for 09/24/2020.  Risk benefits possible complications and procedure details including not limited to bleeding infection ischemic steal nonmaturation of the fistula explained to the patient today.  He understands and agrees to proceed.  Ruta Hinds, MD [facility] Office: [phone]

## 2020-08-30 ENCOUNTER — Ambulatory Visit (INDEPENDENT_AMBULATORY_CARE_PROVIDER_SITE_OTHER): Payer: Medicare HMO | Admitting: Podiatry

## 2020-08-30 DIAGNOSIS — M86672 Other chronic osteomyelitis, left ankle and foot: Secondary | ICD-10-CM | POA: Diagnosis not present

## 2020-08-30 DIAGNOSIS — D509 Iron deficiency anemia, unspecified: Secondary | ICD-10-CM | POA: Diagnosis not present

## 2020-08-30 DIAGNOSIS — M8580 Other specified disorders of bone density and structure, unspecified site: Secondary | ICD-10-CM

## 2020-08-30 DIAGNOSIS — D688 Other specified coagulation defects: Secondary | ICD-10-CM | POA: Diagnosis not present

## 2020-08-30 DIAGNOSIS — Z992 Dependence on renal dialysis: Secondary | ICD-10-CM | POA: Diagnosis not present

## 2020-08-30 DIAGNOSIS — E1022 Type 1 diabetes mellitus with diabetic chronic kidney disease: Secondary | ICD-10-CM | POA: Diagnosis not present

## 2020-08-30 DIAGNOSIS — T82898A Other specified complication of vascular prosthetic devices, implants and grafts, initial encounter: Secondary | ICD-10-CM | POA: Diagnosis not present

## 2020-08-30 DIAGNOSIS — N186 End stage renal disease: Secondary | ICD-10-CM | POA: Diagnosis not present

## 2020-08-30 DIAGNOSIS — T8249XA Other complication of vascular dialysis catheter, initial encounter: Secondary | ICD-10-CM | POA: Diagnosis not present

## 2020-08-30 DIAGNOSIS — L97524 Non-pressure chronic ulcer of other part of left foot with necrosis of bone: Secondary | ICD-10-CM | POA: Diagnosis not present

## 2020-08-30 DIAGNOSIS — I871 Compression of vein: Secondary | ICD-10-CM | POA: Diagnosis not present

## 2020-08-30 DIAGNOSIS — N2581 Secondary hyperparathyroidism of renal origin: Secondary | ICD-10-CM | POA: Diagnosis not present

## 2020-08-30 DIAGNOSIS — I10 Essential (primary) hypertension: Secondary | ICD-10-CM | POA: Diagnosis not present

## 2020-08-31 DIAGNOSIS — I10 Essential (primary) hypertension: Secondary | ICD-10-CM | POA: Diagnosis not present

## 2020-08-31 MED ORDER — REGRANEX 0.01 % EX GEL: 1.0000 "application " | Freq: Every day | CUTANEOUS | 0 refills | Status: DC

## 2020-08-31 NOTE — Progress Notes (Signed)
  Subjective:  Patient ID: [name], male    DOB: [birth year],  MRN: [id number]  No chief complaint on file.   39 y.o. male presents for wound care. Hx confirmed with patient.  Patient appears rather down today as he has multiple issues complicating his recovery.  He is currently going to the wound care center for his left wrist but they advised him to continue care with [place] for his foot.  His wound has dirt and debris in the wound bed.  When asked what he does not take care of it he says he just puts a dry gauze dressing on it every day.  He has been declined from multiple home health care agencies and is not homebound. Objective:  Physical Exam: Wound Location: left plantar foot Wound Measurement: 4x4 Wound Base: Fibrotic slough, exposed bone at lateral aspect of the wound Peri-wound: Calloused Exudate: Moderate amount Serosanguinous exudate wound without warmth, erythema, signs of acute infection Dirt and debris noted in the wound bed prior to debridement  Post-debridement:   Assessment:   1. Ulcer of left foot, with necrosis of bone ([place])   2. Chronic osteomyelitis involving left ankle and foot ([place])   3. Bone erosion      Plan:  Patient was evaluated and treated and all questions answered.  Ulcer left foot -Dressing applied consisting of prisma, sterile gauze and kerlix -Offload ulcer with CAM boot -Wound cleansed, prepped with Betadine and debrided -Order wound suppplies from [facility] -His wound continues to fail to improve. He was advised to continue care with [place] by the wound care center. -Discussed with patient that if he does not take better care of his wound he is likely to lose his leg. Patient verbalized understanding.  I am very concerned about patient's lack of will to properly care for his wound.  He does have exposed bone in the wound bed.  The remainder of his rather large wound has only thin covering until the rest of the bone on the plantar aspect of foot will  be exposed.  He does not appear to be motivated to heal this wound. -We discussed that in order to promote tissue growth he would ideally benefit from wound VAC which he declines as he has had bad experiences with in the past.  We will prescribe regranex to promote granulation.  Wound was dressed with Prisma and dry sterile dressing today.  This was ordered as part of his wound care supplies to be applied daily. -Patient does appear down today given multitude of concurrent medical issues including foot wound, left wrist wound, HD access issues.  Procedure: Excisional Debridement of Wound Indication: Removal of non-viable soft tissue from the wound to promote healing.  Anesthesia: none Pre-Debridement Wound Measurements: 3.5 cm x 4 cm x 0.5 cm  Post-Debridement Wound Measurements: 4 cm x 4 cm x 0.5 cm  Type of Debridement: Sharp Excisional Tissue Removed: Non-viable soft tissue Instrumentation: 15 blade and tissue nipper Depth of Debridement: bone Technique: Sharp excisional debridement to bleeding, viable wound base.  Dressing: Dry, sterile, compression dressing. Disposition: Patient tolerated procedure well. Patient to return in 1 week for follow-up.    No follow-ups on file.

## 2020-09-01 DIAGNOSIS — I10 Essential (primary) hypertension: Secondary | ICD-10-CM | POA: Diagnosis not present

## 2020-09-02 DIAGNOSIS — E1022 Type 1 diabetes mellitus with diabetic chronic kidney disease: Secondary | ICD-10-CM | POA: Diagnosis not present

## 2020-09-02 DIAGNOSIS — L97524 Non-pressure chronic ulcer of other part of left foot with necrosis of bone: Secondary | ICD-10-CM | POA: Diagnosis not present

## 2020-09-02 DIAGNOSIS — N186 End stage renal disease: Secondary | ICD-10-CM | POA: Diagnosis not present

## 2020-09-02 DIAGNOSIS — D688 Other specified coagulation defects: Secondary | ICD-10-CM | POA: Diagnosis not present

## 2020-09-02 DIAGNOSIS — Z992 Dependence on renal dialysis: Secondary | ICD-10-CM | POA: Diagnosis not present

## 2020-09-02 DIAGNOSIS — N2581 Secondary hyperparathyroidism of renal origin: Secondary | ICD-10-CM | POA: Diagnosis not present

## 2020-09-02 DIAGNOSIS — D509 Iron deficiency anemia, unspecified: Secondary | ICD-10-CM | POA: Diagnosis not present

## 2020-09-02 DIAGNOSIS — I10 Essential (primary) hypertension: Secondary | ICD-10-CM | POA: Diagnosis not present

## 2020-09-02 DIAGNOSIS — T8249XA Other complication of vascular dialysis catheter, initial encounter: Secondary | ICD-10-CM | POA: Diagnosis not present

## 2020-09-03 DIAGNOSIS — I10 Essential (primary) hypertension: Secondary | ICD-10-CM | POA: Diagnosis not present

## 2020-09-04 DIAGNOSIS — E1022 Type 1 diabetes mellitus with diabetic chronic kidney disease: Secondary | ICD-10-CM | POA: Diagnosis not present

## 2020-09-04 DIAGNOSIS — I10 Essential (primary) hypertension: Secondary | ICD-10-CM | POA: Diagnosis not present

## 2020-09-04 DIAGNOSIS — Z992 Dependence on renal dialysis: Secondary | ICD-10-CM | POA: Diagnosis not present

## 2020-09-04 DIAGNOSIS — N2581 Secondary hyperparathyroidism of renal origin: Secondary | ICD-10-CM | POA: Diagnosis not present

## 2020-09-04 DIAGNOSIS — T8249XA Other complication of vascular dialysis catheter, initial encounter: Secondary | ICD-10-CM | POA: Diagnosis not present

## 2020-09-04 DIAGNOSIS — D509 Iron deficiency anemia, unspecified: Secondary | ICD-10-CM | POA: Diagnosis not present

## 2020-09-04 DIAGNOSIS — N186 End stage renal disease: Secondary | ICD-10-CM | POA: Diagnosis not present

## 2020-09-04 DIAGNOSIS — D688 Other specified coagulation defects: Secondary | ICD-10-CM | POA: Diagnosis not present

## 2020-09-05 DIAGNOSIS — I10 Essential (primary) hypertension: Secondary | ICD-10-CM | POA: Diagnosis not present

## 2020-09-06 DIAGNOSIS — E1022 Type 1 diabetes mellitus with diabetic chronic kidney disease: Secondary | ICD-10-CM | POA: Diagnosis not present

## 2020-09-06 DIAGNOSIS — D688 Other specified coagulation defects: Secondary | ICD-10-CM | POA: Diagnosis not present

## 2020-09-06 DIAGNOSIS — N186 End stage renal disease: Secondary | ICD-10-CM | POA: Diagnosis not present

## 2020-09-06 DIAGNOSIS — N2581 Secondary hyperparathyroidism of renal origin: Secondary | ICD-10-CM | POA: Diagnosis not present

## 2020-09-06 DIAGNOSIS — D509 Iron deficiency anemia, unspecified: Secondary | ICD-10-CM | POA: Diagnosis not present

## 2020-09-06 DIAGNOSIS — I10 Essential (primary) hypertension: Secondary | ICD-10-CM | POA: Diagnosis not present

## 2020-09-06 DIAGNOSIS — Z992 Dependence on renal dialysis: Secondary | ICD-10-CM | POA: Diagnosis not present

## 2020-09-06 DIAGNOSIS — T8249XA Other complication of vascular dialysis catheter, initial encounter: Secondary | ICD-10-CM | POA: Diagnosis not present

## 2020-09-07 DIAGNOSIS — I10 Essential (primary) hypertension: Secondary | ICD-10-CM | POA: Diagnosis not present

## 2020-09-08 DIAGNOSIS — I10 Essential (primary) hypertension: Secondary | ICD-10-CM | POA: Diagnosis not present

## 2020-09-09 DIAGNOSIS — I10 Essential (primary) hypertension: Secondary | ICD-10-CM | POA: Diagnosis not present

## 2020-09-09 DIAGNOSIS — N186 End stage renal disease: Secondary | ICD-10-CM | POA: Diagnosis not present

## 2020-09-09 DIAGNOSIS — D688 Other specified coagulation defects: Secondary | ICD-10-CM | POA: Diagnosis not present

## 2020-09-09 DIAGNOSIS — Z992 Dependence on renal dialysis: Secondary | ICD-10-CM | POA: Diagnosis not present

## 2020-09-09 DIAGNOSIS — T8249XA Other complication of vascular dialysis catheter, initial encounter: Secondary | ICD-10-CM | POA: Diagnosis not present

## 2020-09-09 DIAGNOSIS — D509 Iron deficiency anemia, unspecified: Secondary | ICD-10-CM | POA: Diagnosis not present

## 2020-09-09 DIAGNOSIS — N2581 Secondary hyperparathyroidism of renal origin: Secondary | ICD-10-CM | POA: Diagnosis not present

## 2020-09-09 DIAGNOSIS — E1022 Type 1 diabetes mellitus with diabetic chronic kidney disease: Secondary | ICD-10-CM | POA: Diagnosis not present

## 2020-09-10 ENCOUNTER — Telehealth: Payer: Self-pay | Admitting: Podiatry

## 2020-09-10 ENCOUNTER — Other Ambulatory Visit: Payer: Self-pay

## 2020-09-10 ENCOUNTER — Inpatient Hospital Stay (HOSPITAL_COMMUNITY)
Admission: EM | Admit: 2020-09-10 | Discharge: 2020-10-11 | DRG: 853 | Discharge status: Against Medical Advice | Payer: Medicare HMO | Attending: Internal Medicine | Admitting: Internal Medicine

## 2020-09-10 ENCOUNTER — Encounter (HOSPITAL_COMMUNITY): Payer: Self-pay | Admitting: Emergency Medicine

## 2020-09-10 DIAGNOSIS — I120 Hypertensive chronic kidney disease with stage 5 chronic kidney disease or end stage renal disease: Secondary | ICD-10-CM | POA: Diagnosis present

## 2020-09-10 DIAGNOSIS — A4181 Sepsis due to Enterococcus: Secondary | ICD-10-CM | POA: Diagnosis not present

## 2020-09-10 DIAGNOSIS — D72828 Other elevated white blood cell count: Secondary | ICD-10-CM | POA: Diagnosis present

## 2020-09-10 DIAGNOSIS — D631 Anemia in chronic kidney disease: Secondary | ICD-10-CM | POA: Diagnosis present

## 2020-09-10 DIAGNOSIS — F4322 Adjustment disorder with anxiety: Secondary | ICD-10-CM | POA: Diagnosis present

## 2020-09-10 DIAGNOSIS — M898X9 Other specified disorders of bone, unspecified site: Secondary | ICD-10-CM | POA: Diagnosis present

## 2020-09-10 DIAGNOSIS — N186 End stage renal disease: Secondary | ICD-10-CM | POA: Diagnosis not present

## 2020-09-10 DIAGNOSIS — E1069 Type 1 diabetes mellitus with other specified complication: Secondary | ICD-10-CM | POA: Diagnosis present

## 2020-09-10 DIAGNOSIS — E871 Hypo-osmolality and hyponatremia: Secondary | ICD-10-CM | POA: Diagnosis present

## 2020-09-10 DIAGNOSIS — L97519 Non-pressure chronic ulcer of other part of right foot with unspecified severity: Secondary | ICD-10-CM | POA: Diagnosis present

## 2020-09-10 DIAGNOSIS — E108 Type 1 diabetes mellitus with unspecified complications: Secondary | ICD-10-CM | POA: Diagnosis present

## 2020-09-10 DIAGNOSIS — Z992 Dependence on renal dialysis: Secondary | ICD-10-CM

## 2020-09-10 DIAGNOSIS — L97426 Non-pressure chronic ulcer of left heel and midfoot with bone involvement without evidence of necrosis: Secondary | ICD-10-CM | POA: Diagnosis present

## 2020-09-10 DIAGNOSIS — E11621 Type 2 diabetes mellitus with foot ulcer: Secondary | ICD-10-CM

## 2020-09-10 DIAGNOSIS — M869 Osteomyelitis, unspecified: Secondary | ICD-10-CM | POA: Diagnosis not present

## 2020-09-10 DIAGNOSIS — R5381 Other malaise: Secondary | ICD-10-CM | POA: Diagnosis not present

## 2020-09-10 DIAGNOSIS — L98499 Non-pressure chronic ulcer of skin of other sites with unspecified severity: Secondary | ICD-10-CM | POA: Diagnosis present

## 2020-09-10 DIAGNOSIS — E875 Hyperkalemia: Secondary | ICD-10-CM | POA: Diagnosis not present

## 2020-09-10 DIAGNOSIS — N250 Renal osteodystrophy: Secondary | ICD-10-CM | POA: Diagnosis not present

## 2020-09-10 DIAGNOSIS — E1065 Type 1 diabetes mellitus with hyperglycemia: Secondary | ICD-10-CM | POA: Diagnosis present

## 2020-09-10 DIAGNOSIS — E08621 Diabetes mellitus due to underlying condition with foot ulcer: Secondary | ICD-10-CM | POA: Diagnosis present

## 2020-09-10 DIAGNOSIS — Z9111 Patient's noncompliance with dietary regimen: Secondary | ICD-10-CM

## 2020-09-10 DIAGNOSIS — E1129 Type 2 diabetes mellitus with other diabetic kidney complication: Secondary | ICD-10-CM | POA: Diagnosis not present

## 2020-09-10 DIAGNOSIS — M86172 Other acute osteomyelitis, left ankle and foot: Secondary | ICD-10-CM | POA: Diagnosis present

## 2020-09-10 DIAGNOSIS — L298 Other pruritus: Secondary | ICD-10-CM | POA: Diagnosis present

## 2020-09-10 DIAGNOSIS — F1120 Opioid dependence, uncomplicated: Secondary | ICD-10-CM | POA: Diagnosis present

## 2020-09-10 DIAGNOSIS — T8781 Dehiscence of amputation stump: Secondary | ICD-10-CM | POA: Diagnosis not present

## 2020-09-10 DIAGNOSIS — Z794 Long term (current) use of insulin: Secondary | ICD-10-CM

## 2020-09-10 DIAGNOSIS — Z6823 Body mass index (BMI) 23.0-23.9, adult: Secondary | ICD-10-CM

## 2020-09-10 DIAGNOSIS — L97429 Non-pressure chronic ulcer of left heel and midfoot with unspecified severity: Secondary | ICD-10-CM | POA: Diagnosis present

## 2020-09-10 DIAGNOSIS — E10649 Type 1 diabetes mellitus with hypoglycemia without coma: Secondary | ICD-10-CM | POA: Diagnosis not present

## 2020-09-10 DIAGNOSIS — Z833 Family history of diabetes mellitus: Secondary | ICD-10-CM

## 2020-09-10 DIAGNOSIS — K3184 Gastroparesis: Secondary | ICD-10-CM | POA: Diagnosis present

## 2020-09-10 DIAGNOSIS — A419 Sepsis, unspecified organism: Secondary | ICD-10-CM | POA: Diagnosis present

## 2020-09-10 DIAGNOSIS — E8770 Fluid overload, unspecified: Secondary | ICD-10-CM | POA: Diagnosis not present

## 2020-09-10 DIAGNOSIS — E1022 Type 1 diabetes mellitus with diabetic chronic kidney disease: Secondary | ICD-10-CM | POA: Diagnosis present

## 2020-09-10 DIAGNOSIS — E1043 Type 1 diabetes mellitus with diabetic autonomic (poly)neuropathy: Secondary | ICD-10-CM | POA: Diagnosis present

## 2020-09-10 DIAGNOSIS — L02414 Cutaneous abscess of left upper limb: Secondary | ICD-10-CM | POA: Diagnosis present

## 2020-09-10 DIAGNOSIS — I10 Essential (primary) hypertension: Secondary | ICD-10-CM | POA: Diagnosis present

## 2020-09-10 DIAGNOSIS — F319 Bipolar disorder, unspecified: Secondary | ICD-10-CM | POA: Diagnosis present

## 2020-09-10 DIAGNOSIS — Y835 Amputation of limb(s) as the cause of abnormal reaction of the patient, or of later complication, without mention of misadventure at the time of the procedure: Secondary | ICD-10-CM | POA: Diagnosis not present

## 2020-09-10 DIAGNOSIS — E1052 Type 1 diabetes mellitus with diabetic peripheral angiopathy with gangrene: Secondary | ICD-10-CM | POA: Diagnosis present

## 2020-09-10 DIAGNOSIS — E10621 Type 1 diabetes mellitus with foot ulcer: Secondary | ICD-10-CM | POA: Diagnosis present

## 2020-09-10 DIAGNOSIS — Z79899 Other long term (current) drug therapy: Secondary | ICD-10-CM

## 2020-09-10 DIAGNOSIS — F4320 Adjustment disorder, unspecified: Secondary | ICD-10-CM

## 2020-09-10 DIAGNOSIS — E118 Type 2 diabetes mellitus with unspecified complications: Secondary | ICD-10-CM | POA: Diagnosis present

## 2020-09-10 DIAGNOSIS — E872 Acidosis: Secondary | ICD-10-CM | POA: Diagnosis present

## 2020-09-10 DIAGNOSIS — R52 Pain, unspecified: Secondary | ICD-10-CM

## 2020-09-10 DIAGNOSIS — E440 Moderate protein-calorie malnutrition: Secondary | ICD-10-CM | POA: Diagnosis present

## 2020-09-10 DIAGNOSIS — E109 Type 1 diabetes mellitus without complications: Secondary | ICD-10-CM | POA: Diagnosis present

## 2020-09-10 DIAGNOSIS — B9561 Methicillin susceptible Staphylococcus aureus infection as the cause of diseases classified elsewhere: Secondary | ICD-10-CM | POA: Diagnosis present

## 2020-09-10 DIAGNOSIS — N2581 Secondary hyperparathyroidism of renal origin: Secondary | ICD-10-CM | POA: Diagnosis present

## 2020-09-10 DIAGNOSIS — Z89412 Acquired absence of left great toe: Secondary | ICD-10-CM | POA: Diagnosis not present

## 2020-09-10 DIAGNOSIS — F29 Unspecified psychosis not due to a substance or known physiological condition: Secondary | ICD-10-CM | POA: Diagnosis present

## 2020-09-10 DIAGNOSIS — Z9115 Patient's noncompliance with renal dialysis: Secondary | ICD-10-CM

## 2020-09-10 DIAGNOSIS — L97529 Non-pressure chronic ulcer of other part of left foot with unspecified severity: Secondary | ICD-10-CM | POA: Diagnosis not present

## 2020-09-10 DIAGNOSIS — M79672 Pain in left foot: Secondary | ICD-10-CM | POA: Diagnosis not present

## 2020-09-10 DIAGNOSIS — R17 Unspecified jaundice: Secondary | ICD-10-CM | POA: Diagnosis present

## 2020-09-10 DIAGNOSIS — M86672 Other chronic osteomyelitis, left ankle and foot: Secondary | ICD-10-CM | POA: Diagnosis present

## 2020-09-10 DIAGNOSIS — D649 Anemia, unspecified: Secondary | ICD-10-CM | POA: Diagnosis not present

## 2020-09-10 DIAGNOSIS — G546 Phantom limb syndrome with pain: Secondary | ICD-10-CM | POA: Diagnosis not present

## 2020-09-10 DIAGNOSIS — Z9119 Patient's noncompliance with other medical treatment and regimen: Secondary | ICD-10-CM

## 2020-09-10 DIAGNOSIS — F6089 Other specific personality disorders: Secondary | ICD-10-CM | POA: Diagnosis present

## 2020-09-10 DIAGNOSIS — Z765 Malingerer [conscious simulation]: Secondary | ICD-10-CM

## 2020-09-10 DIAGNOSIS — Z8616 Personal history of COVID-19: Secondary | ICD-10-CM

## 2020-09-10 HISTORY — DX: Anemia, unspecified: D64.9

## 2020-09-10 LAB — COMPREHENSIVE METABOLIC PANEL
ALT: 12 U/L (ref 0–44)
AST: 14 U/L — ABNORMAL LOW (ref 15–41)
Albumin: 2.4 g/dL — ABNORMAL LOW (ref 3.5–5.0)
Alkaline Phosphatase: 180 U/L — ABNORMAL HIGH (ref 38–126)
Anion gap: 22 — ABNORMAL HIGH (ref 5–15)
BUN: 42 mg/dL — ABNORMAL HIGH (ref 6–20)
CO2: 19 mmol/L — ABNORMAL LOW (ref 22–32)
Calcium: 8.9 mg/dL (ref 8.9–10.3)
Chloride: 87 mmol/L — ABNORMAL LOW (ref 98–111)
Creatinine, Ser: 8.65 mg/dL — ABNORMAL HIGH (ref 0.61–1.24)
GFR, Estimated: 7 mL/min — ABNORMAL LOW (ref >60)
Glucose, Bld: 328 mg/dL — ABNORMAL HIGH (ref 70–99)
Potassium: 5.1 mmol/L (ref 3.5–5.1)
Sodium: 128 mmol/L — ABNORMAL LOW (ref 135–145)
Total Bilirubin: 2.4 mg/dL — ABNORMAL HIGH (ref 0.3–1.2)
Total Protein: 7.5 g/dL (ref 6.5–8.1)

## 2020-09-10 LAB — CBC WITH DIFFERENTIAL/PLATELET
Abs Immature Granulocytes: 0 10*3/uL (ref 0.00–0.07)
Basophils Absolute: 0.3 10*3/uL — ABNORMAL HIGH (ref 0.0–0.1)
Basophils Relative: 1 %
Eosinophils Absolute: 0 10*3/uL (ref 0.0–0.5)
Eosinophils Relative: 0 %
HCT: 36.1 % — ABNORMAL LOW (ref 39.0–52.0)
Hemoglobin: 10.8 g/dL — ABNORMAL LOW (ref 13.0–17.0)
Lymphocytes Relative: 3 %
Lymphs Abs: 0.9 10*3/uL (ref 0.7–4.0)
MCH: 28.1 pg (ref 26.0–34.0)
MCHC: 29.9 g/dL — ABNORMAL LOW (ref 30.0–36.0)
MCV: 93.8 fL (ref 80.0–100.0)
Monocytes Absolute: 0.9 10*3/uL (ref 0.1–1.0)
Monocytes Relative: 3 %
Neutro Abs: 28.8 10*3/uL — ABNORMAL HIGH (ref 1.7–7.7)
Neutrophils Relative %: 93 %
Platelets: 203 10*3/uL (ref 150–400)
RBC: 3.85 MIL/uL — ABNORMAL LOW (ref 4.22–5.81)
RDW: 13.7 % (ref 11.5–15.5)
WBC: 31 10*3/uL — ABNORMAL HIGH (ref 4.0–10.5)
nRBC: 0 % (ref 0.0–0.2)
nRBC: 0 /100 WBC

## 2020-09-10 LAB — LACTIC ACID, PLASMA: Lactic Acid, Venous: 2 mmol/L (ref 0.5–1.9)

## 2020-09-10 LAB — CBG MONITORING, ED: Glucose-Capillary: 212 mg/dL — ABNORMAL HIGH (ref 70–99)

## 2020-09-10 MED ORDER — ACETAMINOPHEN 325 MG PO TABS
650.0000 mg | ORAL_TABLET | Freq: Once | ORAL | Status: AC
  Administered 2020-09-10: 21:00:00 650 mg via ORAL
  Filled 2020-09-10: qty 2

## 2020-09-10 NOTE — ED Triage Notes (Signed)
Pt to ED via [facility] with c/o pain and wound to bottom of left foot.Pt is diabetic and on dialysis.  Last dialysis was yesterday

## 2020-09-10 NOTE — ED Notes (Signed)
Pt demanding that "you need to get me my insulin right now" multiple times. States he is a type 1 diabetic, needs his insulin. Pt informed triage nurse cannot administer his insulin, pt stated he brought his insulin with him and would administer it himself in the lobby.

## 2020-09-10 NOTE — Telephone Encounter (Signed)
Patient called in stating he was headed to hospital and wanted Dr.Price to be aware so he could come check up on him. I personally walked down to be sure Price was aware of message that was left by patient

## 2020-09-10 NOTE — ED Triage Notes (Signed)
Pt also c/o diarrhea for several days and feels weak

## 2020-09-10 NOTE — Telephone Encounter (Signed)
Done 09/10/20

## 2020-09-11 ENCOUNTER — Encounter (HOSPITAL_COMMUNITY): Payer: Self-pay | Admitting: Family Medicine

## 2020-09-11 ENCOUNTER — Telehealth: Payer: Self-pay | Admitting: Podiatry

## 2020-09-11 ENCOUNTER — Emergency Department (HOSPITAL_COMMUNITY): Payer: Medicare HMO

## 2020-09-11 ENCOUNTER — Other Ambulatory Visit: Payer: Self-pay

## 2020-09-11 DIAGNOSIS — N250 Renal osteodystrophy: Secondary | ICD-10-CM | POA: Diagnosis not present

## 2020-09-11 DIAGNOSIS — Z9119 Patient's noncompliance with other medical treatment and regimen: Secondary | ICD-10-CM | POA: Diagnosis not present

## 2020-09-11 DIAGNOSIS — S60212A Contusion of left wrist, initial encounter: Secondary | ICD-10-CM | POA: Diagnosis not present

## 2020-09-11 DIAGNOSIS — E872 Acidosis: Secondary | ICD-10-CM | POA: Diagnosis not present

## 2020-09-11 DIAGNOSIS — E1022 Type 1 diabetes mellitus with diabetic chronic kidney disease: Secondary | ICD-10-CM

## 2020-09-11 DIAGNOSIS — E871 Hypo-osmolality and hyponatremia: Secondary | ICD-10-CM | POA: Diagnosis not present

## 2020-09-11 DIAGNOSIS — M869 Osteomyelitis, unspecified: Secondary | ICD-10-CM | POA: Diagnosis present

## 2020-09-11 DIAGNOSIS — E440 Moderate protein-calorie malnutrition: Secondary | ICD-10-CM | POA: Diagnosis not present

## 2020-09-11 DIAGNOSIS — Z8616 Personal history of COVID-19: Secondary | ICD-10-CM | POA: Diagnosis not present

## 2020-09-11 DIAGNOSIS — Z4781 Encounter for orthopedic aftercare following surgical amputation: Secondary | ICD-10-CM | POA: Diagnosis not present

## 2020-09-11 DIAGNOSIS — Y835 Amputation of limb(s) as the cause of abnormal reaction of the patient, or of later complication, without mention of misadventure at the time of the procedure: Secondary | ICD-10-CM | POA: Diagnosis not present

## 2020-09-11 DIAGNOSIS — L97524 Non-pressure chronic ulcer of other part of left foot with necrosis of bone: Secondary | ICD-10-CM | POA: Diagnosis not present

## 2020-09-11 DIAGNOSIS — Z833 Family history of diabetes mellitus: Secondary | ICD-10-CM | POA: Diagnosis not present

## 2020-09-11 DIAGNOSIS — L97928 Non-pressure chronic ulcer of unspecified part of left lower leg with other specified severity: Secondary | ICD-10-CM | POA: Diagnosis not present

## 2020-09-11 DIAGNOSIS — M898X9 Other specified disorders of bone, unspecified site: Secondary | ICD-10-CM | POA: Diagnosis not present

## 2020-09-11 DIAGNOSIS — M86172 Other acute osteomyelitis, left ankle and foot: Secondary | ICD-10-CM | POA: Diagnosis not present

## 2020-09-11 DIAGNOSIS — N186 End stage renal disease: Secondary | ICD-10-CM

## 2020-09-11 DIAGNOSIS — Z794 Long term (current) use of insulin: Secondary | ICD-10-CM | POA: Diagnosis not present

## 2020-09-11 DIAGNOSIS — Z992 Dependence on renal dialysis: Secondary | ICD-10-CM

## 2020-09-11 DIAGNOSIS — D649 Anemia, unspecified: Secondary | ICD-10-CM | POA: Diagnosis not present

## 2020-09-11 DIAGNOSIS — L02414 Cutaneous abscess of left upper limb: Secondary | ICD-10-CM | POA: Diagnosis not present

## 2020-09-11 DIAGNOSIS — Z9115 Patient's noncompliance with renal dialysis: Secondary | ICD-10-CM | POA: Diagnosis not present

## 2020-09-11 DIAGNOSIS — N2581 Secondary hyperparathyroidism of renal origin: Secondary | ICD-10-CM | POA: Diagnosis not present

## 2020-09-11 DIAGNOSIS — I120 Hypertensive chronic kidney disease with stage 5 chronic kidney disease or end stage renal disease: Secondary | ICD-10-CM | POA: Diagnosis not present

## 2020-09-11 DIAGNOSIS — D481 Neoplasm of uncertain behavior of connective and other soft tissue: Secondary | ICD-10-CM | POA: Diagnosis not present

## 2020-09-11 DIAGNOSIS — U071 COVID-19: Secondary | ICD-10-CM | POA: Diagnosis not present

## 2020-09-11 DIAGNOSIS — A4101 Sepsis due to Methicillin susceptible Staphylococcus aureus: Secondary | ICD-10-CM | POA: Diagnosis not present

## 2020-09-11 DIAGNOSIS — Z89412 Acquired absence of left great toe: Secondary | ICD-10-CM | POA: Diagnosis not present

## 2020-09-11 DIAGNOSIS — M25832 Other specified joint disorders, left wrist: Secondary | ICD-10-CM | POA: Diagnosis not present

## 2020-09-11 DIAGNOSIS — D509 Iron deficiency anemia, unspecified: Secondary | ICD-10-CM | POA: Diagnosis not present

## 2020-09-11 DIAGNOSIS — Z79899 Other long term (current) drug therapy: Secondary | ICD-10-CM | POA: Diagnosis not present

## 2020-09-11 DIAGNOSIS — R17 Unspecified jaundice: Secondary | ICD-10-CM | POA: Diagnosis not present

## 2020-09-11 DIAGNOSIS — L97429 Non-pressure chronic ulcer of left heel and midfoot with unspecified severity: Secondary | ICD-10-CM | POA: Diagnosis not present

## 2020-09-11 DIAGNOSIS — E1152 Type 2 diabetes mellitus with diabetic peripheral angiopathy with gangrene: Secondary | ICD-10-CM | POA: Diagnosis not present

## 2020-09-11 DIAGNOSIS — R69 Illness, unspecified: Secondary | ICD-10-CM | POA: Diagnosis not present

## 2020-09-11 DIAGNOSIS — E08621 Diabetes mellitus due to underlying condition with foot ulcer: Secondary | ICD-10-CM | POA: Diagnosis not present

## 2020-09-11 DIAGNOSIS — G4733 Obstructive sleep apnea (adult) (pediatric): Secondary | ICD-10-CM | POA: Diagnosis not present

## 2020-09-11 DIAGNOSIS — Z872 Personal history of diseases of the skin and subcutaneous tissue: Secondary | ICD-10-CM | POA: Diagnosis not present

## 2020-09-11 DIAGNOSIS — M86672 Other chronic osteomyelitis, left ankle and foot: Secondary | ICD-10-CM | POA: Diagnosis not present

## 2020-09-11 DIAGNOSIS — E1069 Type 1 diabetes mellitus with other specified complication: Secondary | ICD-10-CM | POA: Diagnosis present

## 2020-09-11 DIAGNOSIS — E1129 Type 2 diabetes mellitus with other diabetic kidney complication: Secondary | ICD-10-CM | POA: Diagnosis not present

## 2020-09-11 DIAGNOSIS — I10 Essential (primary) hypertension: Secondary | ICD-10-CM

## 2020-09-11 DIAGNOSIS — S91302A Unspecified open wound, left foot, initial encounter: Secondary | ICD-10-CM | POA: Diagnosis not present

## 2020-09-11 DIAGNOSIS — L97529 Non-pressure chronic ulcer of other part of left foot with unspecified severity: Secondary | ICD-10-CM | POA: Diagnosis not present

## 2020-09-11 DIAGNOSIS — F4320 Adjustment disorder, unspecified: Secondary | ICD-10-CM | POA: Diagnosis not present

## 2020-09-11 DIAGNOSIS — R52 Pain, unspecified: Secondary | ICD-10-CM | POA: Diagnosis not present

## 2020-09-11 DIAGNOSIS — I70248 Atherosclerosis of native arteries of left leg with ulceration of other part of lower left leg: Secondary | ICD-10-CM | POA: Diagnosis not present

## 2020-09-11 DIAGNOSIS — L97426 Non-pressure chronic ulcer of left heel and midfoot with bone involvement without evidence of necrosis: Secondary | ICD-10-CM | POA: Diagnosis not present

## 2020-09-11 DIAGNOSIS — M67832 Other specified disorders of synovium, left wrist: Secondary | ICD-10-CM | POA: Diagnosis not present

## 2020-09-11 DIAGNOSIS — Z9111 Patient's noncompliance with dietary regimen: Secondary | ICD-10-CM | POA: Diagnosis not present

## 2020-09-11 DIAGNOSIS — E11621 Type 2 diabetes mellitus with foot ulcer: Secondary | ICD-10-CM | POA: Diagnosis not present

## 2020-09-11 DIAGNOSIS — R601 Generalized edema: Secondary | ICD-10-CM | POA: Diagnosis not present

## 2020-09-11 DIAGNOSIS — R600 Localized edema: Secondary | ICD-10-CM | POA: Diagnosis not present

## 2020-09-11 DIAGNOSIS — Z0389 Encounter for observation for other suspected diseases and conditions ruled out: Secondary | ICD-10-CM | POA: Diagnosis not present

## 2020-09-11 DIAGNOSIS — F1120 Opioid dependence, uncomplicated: Secondary | ICD-10-CM | POA: Diagnosis not present

## 2020-09-11 DIAGNOSIS — E1052 Type 1 diabetes mellitus with diabetic peripheral angiopathy with gangrene: Secondary | ICD-10-CM | POA: Diagnosis not present

## 2020-09-11 DIAGNOSIS — A4181 Sepsis due to Enterococcus: Secondary | ICD-10-CM | POA: Diagnosis not present

## 2020-09-11 DIAGNOSIS — J1282 Pneumonia due to coronavirus disease 2019: Secondary | ICD-10-CM | POA: Diagnosis not present

## 2020-09-11 LAB — COMPREHENSIVE METABOLIC PANEL
ALT: 12 U/L (ref 0–44)
AST: 15 U/L (ref 15–41)
Albumin: 2.1 g/dL — ABNORMAL LOW (ref 3.5–5.0)
Alkaline Phosphatase: 171 U/L — ABNORMAL HIGH (ref 38–126)
Anion gap: 24 — ABNORMAL HIGH (ref 5–15)
BUN: 59 mg/dL — ABNORMAL HIGH (ref 6–20)
CO2: 16 mmol/L — ABNORMAL LOW (ref 22–32)
Calcium: 8.5 mg/dL — ABNORMAL LOW (ref 8.9–10.3)
Chloride: 89 mmol/L — ABNORMAL LOW (ref 98–111)
Creatinine, Ser: 9.9 mg/dL — ABNORMAL HIGH (ref 0.61–1.24)
GFR, Estimated: 6 mL/min — ABNORMAL LOW (ref >60)
Glucose, Bld: 380 mg/dL — ABNORMAL HIGH (ref 70–99)
Potassium: 5.4 mmol/L — ABNORMAL HIGH (ref 3.5–5.1)
Sodium: 129 mmol/L — ABNORMAL LOW (ref 135–145)
Total Bilirubin: 2.6 mg/dL — ABNORMAL HIGH (ref 0.3–1.2)
Total Protein: 6.5 g/dL (ref 6.5–8.1)

## 2020-09-11 LAB — CBC
HCT: 33.4 % — ABNORMAL LOW (ref 39.0–52.0)
Hemoglobin: 10.2 g/dL — ABNORMAL LOW (ref 13.0–17.0)
MCH: 28.2 pg (ref 26.0–34.0)
MCHC: 30.5 g/dL (ref 30.0–36.0)
MCV: 92.3 fL (ref 80.0–100.0)
Platelets: 191 10*3/uL (ref 150–400)
RBC: 3.62 MIL/uL — ABNORMAL LOW (ref 4.22–5.81)
RDW: 13.8 % (ref 11.5–15.5)
WBC: 29.9 10*3/uL — ABNORMAL HIGH (ref 4.0–10.5)
nRBC: 0 % (ref 0.0–0.2)

## 2020-09-11 LAB — RESP PANEL BY RT-PCR (FLU A&B, COVID) ARPGX2
Influenza A by PCR: NEGATIVE
Influenza B by PCR: NEGATIVE
SARS Coronavirus 2 by RT PCR: NEGATIVE

## 2020-09-11 LAB — CREATININE, SERUM
Creatinine, Ser: 9.44 mg/dL — ABNORMAL HIGH (ref 0.61–1.24)
GFR, Estimated: 7 mL/min — ABNORMAL LOW (ref >60)

## 2020-09-11 LAB — MAGNESIUM: Magnesium: 2.5 mg/dL — ABNORMAL HIGH (ref 1.7–2.4)

## 2020-09-11 LAB — PHOSPHORUS: Phosphorus: 7.8 mg/dL — ABNORMAL HIGH (ref 2.5–4.6)

## 2020-09-11 LAB — LACTIC ACID, PLASMA: Lactic Acid, Venous: 1.7 mmol/L (ref 0.5–1.9)

## 2020-09-11 LAB — GLUCOSE, CAPILLARY: Glucose-Capillary: 266 mg/dL — ABNORMAL HIGH (ref 70–99)

## 2020-09-11 MED ORDER — CINACALCET HCL 30 MG PO TABS
60.0000 mg | ORAL_TABLET | Freq: Every day | ORAL | Status: DC
  Administered 2020-09-11 – 2020-10-11 (×18): 60 mg via ORAL
  Filled 2020-09-11 (×33): qty 2

## 2020-09-11 MED ORDER — SEVELAMER CARBONATE 800 MG PO TABS
4000.0000 mg | ORAL_TABLET | Freq: Three times a day (TID) | ORAL | Status: DC
  Administered 2020-09-12 – 2020-10-11 (×62): 4000 mg via ORAL
  Filled 2020-09-11 (×73): qty 5

## 2020-09-11 MED ORDER — NEPRO/CARBSTEADY PO LIQD
237.0000 mL | Freq: Two times a day (BID) | ORAL | Status: DC
  Administered 2020-09-11: 14:00:00 237 mL via ORAL
  Filled 2020-09-11: qty 237

## 2020-09-11 MED ORDER — CALCITRIOL 0.5 MCG PO CAPS
2.5000 ug | ORAL_CAPSULE | ORAL | Status: DC
  Administered 2020-09-11: 14:00:00 0.5 ug via ORAL
  Administered 2020-09-13 – 2020-10-11 (×6): 2.5 ug via ORAL
  Filled 2020-09-11 (×3): qty 5

## 2020-09-11 MED ORDER — INSULIN ASPART 100 UNIT/ML ~~LOC~~ SOLN
0.0000 [IU] | Freq: Three times a day (TID) | SUBCUTANEOUS | Status: DC
  Administered 2020-09-12: 12:00:00 2 [IU] via SUBCUTANEOUS
  Administered 2020-09-13: 14:00:00 1 [IU] via SUBCUTANEOUS
  Administered 2020-09-14: 17:00:00 5 [IU] via SUBCUTANEOUS
  Administered 2020-09-15 – 2020-09-21 (×2): 3 [IU] via SUBCUTANEOUS
  Administered 2020-09-22 (×2): 2 [IU] via SUBCUTANEOUS
  Administered 2020-09-22: 08:00:00 1 [IU] via SUBCUTANEOUS
  Administered 2020-09-23: 13:00:00 4 [IU] via SUBCUTANEOUS
  Administered 2020-09-23: 08:00:00 2 [IU] via SUBCUTANEOUS
  Administered 2020-09-23: 19:00:00 6 [IU] via SUBCUTANEOUS
  Administered 2020-09-25: 18:00:00 3 [IU] via SUBCUTANEOUS
  Administered 2020-09-25: 12:00:00 1 [IU] via SUBCUTANEOUS
  Administered 2020-09-26: 16:00:00 4 [IU] via SUBCUTANEOUS
  Administered 2020-09-27: 2 [IU] via SUBCUTANEOUS
  Administered 2020-09-27: 1 [IU] via SUBCUTANEOUS
  Administered 2020-09-28: 3 [IU] via SUBCUTANEOUS
  Administered 2020-09-28: 2 [IU] via SUBCUTANEOUS
  Administered 2020-09-29: 3 [IU] via SUBCUTANEOUS
  Administered 2020-09-29: 1 [IU] via SUBCUTANEOUS
  Administered 2020-09-30: 6 [IU] via SUBCUTANEOUS
  Administered 2020-10-01: 3 [IU] via SUBCUTANEOUS
  Administered 2020-10-01: 2 [IU] via SUBCUTANEOUS
  Administered 2020-10-01: 1 [IU] via SUBCUTANEOUS
  Administered 2020-10-02 – 2020-10-03 (×3): 2 [IU] via SUBCUTANEOUS
  Administered 2020-10-05 (×2): 3 [IU] via SUBCUTANEOUS
  Administered 2020-10-06: 4 [IU] via SUBCUTANEOUS
  Administered 2020-10-06: 2 [IU] via SUBCUTANEOUS
  Administered 2020-10-06: 5 [IU] via SUBCUTANEOUS
  Administered 2020-10-07 (×2): 2 [IU] via SUBCUTANEOUS
  Administered 2020-10-07: 1 [IU] via SUBCUTANEOUS

## 2020-09-11 MED ORDER — HEPARIN SODIUM (PORCINE) 5000 UNIT/ML IJ SOLN
5000.0000 [IU] | Freq: Three times a day (TID) | INTRAMUSCULAR | Status: DC
  Administered 2020-09-11 – 2020-10-11 (×63): 5000 [IU] via SUBCUTANEOUS
  Filled 2020-09-11 (×67): qty 1

## 2020-09-11 MED ORDER — HEPARIN SODIUM (PORCINE) 1000 UNIT/ML IJ SOLN
INTRAMUSCULAR | Status: AC
  Filled 2020-09-11: qty 4

## 2020-09-11 MED ORDER — VANCOMYCIN HCL IN DEXTROSE 750-5 MG/150ML-% IV SOLN
750.0000 mg | INTRAVENOUS | Status: DC
  Administered 2020-09-11 – 2020-09-13 (×2): 750 mg via INTRAVENOUS
  Filled 2020-09-11 (×6): qty 150

## 2020-09-11 MED ORDER — CHLORHEXIDINE GLUCONATE CLOTH 2 % EX PADS
6.0000 | MEDICATED_PAD | Freq: Every day | CUTANEOUS | Status: DC
  Administered 2020-09-12 – 2020-10-09 (×11): 6 via TOPICAL

## 2020-09-11 MED ORDER — RENA-VITE PO TABS
1.0000 | ORAL_TABLET | Freq: Every day | ORAL | Status: DC
  Administered 2020-09-11 – 2020-09-17 (×6): 1 via ORAL
  Filled 2020-09-11 (×6): qty 1

## 2020-09-11 MED ORDER — SEVELAMER CARBONATE 800 MG PO TABS
2400.0000 mg | ORAL_TABLET | ORAL | Status: DC | PRN
  Administered 2020-09-11 – 2020-09-30 (×2): 2400 mg via ORAL
  Filled 2020-09-11: qty 3

## 2020-09-11 MED ORDER — VANCOMYCIN HCL 1250 MG/250ML IV SOLN
1250.0000 mg | Freq: Once | INTRAVENOUS | Status: AC
  Administered 2020-09-11: 04:00:00 1250 mg via INTRAVENOUS
  Filled 2020-09-11: qty 250

## 2020-09-11 MED ORDER — ALBUTEROL SULFATE HFA 108 (90 BASE) MCG/ACT IN AERS: 2.0000 | INHALATION_SPRAY | Freq: Four times a day (QID) | RESPIRATORY_TRACT | Status: DC | PRN

## 2020-09-11 MED ORDER — OXYCODONE HCL 5 MG PO TABS
5.0000 mg | ORAL_TABLET | Freq: Four times a day (QID) | ORAL | Status: DC | PRN
  Administered 2020-09-11 – 2020-09-16 (×11): 5 mg via ORAL
  Filled 2020-09-11 (×13): qty 1

## 2020-09-11 MED ORDER — SODIUM CHLORIDE 0.9 % IV SOLN
1.0000 g | INTRAVENOUS | Status: AC
  Administered 2020-09-11 – 2020-09-17 (×7): 1 g via INTRAVENOUS
  Filled 2020-09-11 (×8): qty 1

## 2020-09-11 MED ORDER — HEPARIN SODIUM (PORCINE) 1000 UNIT/ML IJ SOLN
INTRAMUSCULAR | Status: AC
  Administered 2020-09-11: 4000 [IU] via INTRAVENOUS_CENTRAL
  Filled 2020-09-11: qty 4

## 2020-09-11 MED ORDER — PIPERACILLIN-TAZOBACTAM IN DEX 2-0.25 GM/50ML IV SOLN
2.2500 g | Freq: Three times a day (TID) | INTRAVENOUS | Status: DC
  Administered 2020-09-11: 03:00:00 2.25 g via INTRAVENOUS
  Filled 2020-09-11 (×2): qty 50

## 2020-09-11 MED ORDER — HEPARIN SODIUM (PORCINE) 1000 UNIT/ML DIALYSIS: 4000.0000 [IU] | INTRAMUSCULAR | Status: AC | PRN

## 2020-09-11 MED ORDER — HYDROMORPHONE HCL 1 MG/ML IJ SOLN
1.0000 mg | INTRAMUSCULAR | Status: AC | PRN
  Administered 2020-09-11 – 2020-09-12 (×4): 1 mg via INTRAVENOUS
  Filled 2020-09-11 (×4): qty 1

## 2020-09-11 MED ORDER — FENTANYL CITRATE (PF) 100 MCG/2ML IJ SOLN
50.0000 ug | Freq: Once | INTRAMUSCULAR | Status: AC
  Administered 2020-09-11: 03:00:00 50 ug via INTRAVENOUS
  Filled 2020-09-11: qty 2

## 2020-09-11 MED ORDER — INSULIN ASPART 100 UNIT/ML ~~LOC~~ SOLN
0.0000 [IU] | Freq: Every day | SUBCUTANEOUS | Status: DC
  Administered 2020-09-11: 22:00:00 3 [IU] via SUBCUTANEOUS
  Administered 2020-09-12: 22:00:00 2 [IU] via SUBCUTANEOUS
  Administered 2020-09-15: 21:00:00 3 [IU] via SUBCUTANEOUS
  Administered 2020-09-18: 09:00:00 1 [IU] via SUBCUTANEOUS
  Administered 2020-09-21 – 2020-09-23 (×2): 2 [IU] via SUBCUTANEOUS

## 2020-09-11 MED ORDER — INSULIN GLARGINE 100 UNIT/ML ~~LOC~~ SOLN
6.0000 [IU] | Freq: Every day | SUBCUTANEOUS | Status: DC
  Administered 2020-09-11 – 2020-09-21 (×6): 6 [IU] via SUBCUTANEOUS
  Filled 2020-09-11 (×14): qty 0.06

## 2020-09-11 MED ORDER — LACTATED RINGERS IV SOLN: INTRAVENOUS | Status: AC

## 2020-09-11 MED ORDER — SEVELAMER CARBONATE 800 MG PO TABS: 2400.0000 mg | ORAL_TABLET | ORAL | Status: DC

## 2020-09-11 MED ORDER — HYDROMORPHONE HCL 1 MG/ML IJ SOLN
1.0000 mg | INTRAMUSCULAR | Status: AC | PRN
Start: 2020-09-11 — End: 2020-09-11
  Administered 2020-09-11 (×4): 1 mg via INTRAVENOUS
  Filled 2020-09-11 (×4): qty 1

## 2020-09-11 NOTE — Plan of Care (Signed)

## 2020-09-11 NOTE — Telephone Encounter (Signed)
Third attempt today to update patient's mother. No answer, another VM left.

## 2020-09-11 NOTE — Consult Note (Signed)
[facility] Renal Consultation Note  Indication for Consultation:  Management of ESRD/hemodialysis; anemia, hypertension/volume and secondary hyperparathyroidism  HPI: [name] is a 39 y.o. male hx of HTN, gastroparesis, DM2 and ESRD on HD MWF [facility], extensive history of wounds bilateral feet with right fifth toe metatarsal amputation presented to ER with worsening foot pain on left side.  He reported some foul-smelling drainage, subjective fever and chills denies chest pain shortness of breath.  He has not missed dialysis this week.  They are x-ray showed hospitalized foot he is admitted.  We are consulted for hemodialysis ESRD needs potassium 5.1 BUN 42 creatinine 9.44 WBC 29.9 Hgb 10.2 O2 sat 100% on room air BP controlled in ER      Past Medical History:  Diagnosis Date  . Anemia   . Diabetes mellitus without complication ([place])   . Diabetic gastroparesis ([name])   . Dialysis patient ([place])   . Hypertension   . Renal disorder    Dialysis  . Sepsis [facility])     Past Surgical History:  Procedure Laterality Date  . AMPUTATION Right 02/02/2018   Procedure: RIGHT FIFTH TOE AND METATARSAL AMPUTATION. Filetted toe flap metatarsal resection. Debridement Plantar Foot wound;  Surgeon: Evelina Bucy, DPM;  Location: [name];  Service: Podiatry;  Laterality: Right;  . AMPUTATION Left 08/20/2018   Procedure: FIFTH METATARSAL BONE BIOPSY;  Surgeon: Evelina Bucy, DPM;  Location: [place];  Service: Podiatry;  Laterality: Left;  . AMPUTATION Left 10/28/2018   Procedure: LEFT GREAT TOE AMPUTATION;  Surgeon: Evelina Bucy, DPM;  Location: [place];  Service: Podiatry;  Laterality: Left;  . APPLICATION OF WOUND VAC  02/02/2018   Procedure: APPLICATION OF WOUND VAC  Right Foot;  Surgeon: Evelina Bucy, DPM;  Location: [place];  Service: Podiatry;;  . APPLICATION OF WOUND VAC Left 10/28/2018   Procedure: APPLICATION OF WOUND VAC LEFT TOE;  Surgeon: Evelina Bucy, DPM;  Location: [place];   Service: Podiatry;  Laterality: Left;  . APPLICATION OF WOUND VAC Left 11/01/2018   Procedure: APPLICATION OF WOUND VAC;  Surgeon: Evelina Bucy, DPM;  Location: [place];  Service: Podiatry;  Laterality: Left;  . AV FISTULA PLACEMENT     left arm.  . AV FISTULA PLACEMENT Right 12/22/2016   Procedure: INSERTION OF ARTERIOVENOUS (AV) GORE-TEX GRAFT ARM;  Surgeon: Elam Dutch, MD;  Location: [facility] OR;  Service: Vascular;  Laterality: Right;  . AV FISTULA PLACEMENT Left 05/26/2018   Procedure: INSERTION OF  ARTERIOVENOUS (AV) GORE-TEX GRAFT LEFT ARM;  Surgeon: Serafina Mitchell, MD;  Location: [place];  Service: Vascular;  Laterality: Left;  . EYE SURGERY    . I & D EXTREMITY Right 10/31/2017   Procedure: IRRIGATION AND DEBRIDEMENT RIGHT FOOT;  Surgeon: Evelina Bucy, DPM;  Location: [place];  Service: Podiatry;  Laterality: Right;  . I & D EXTREMITY Left 08/20/2018   Procedure: IRRIGATION AND DEBRIDEMENT EXTREMITY WITH SECONDARY WOUND CLOSUREAND APPLICATION OF WOUND VAC LEFT FOOT;  Surgeon: Evelina Bucy, DPM;  Location: [place];  Service: Podiatry;  Laterality: Left;  . I & D EXTREMITY Left 10/20/2018   Procedure: IRRIGATION AND DEBRIDEMENT LEFT FOOT  DEBRIDEMENT LATERAL FOOT WOUND;  Surgeon: Evelina Bucy, DPM;  Location: [place];  Service: Podiatry;  Laterality: Left;  . I & D EXTREMITY Left 10/28/2018   Procedure: IRRIGATION AND DEBRIDEMENT LEFT TOE;  Surgeon: Evelina Bucy, DPM;  Location: [place];  Service: Podiatry;  Laterality: Left;  . INSERTION OF DIALYSIS CATHETER     Right subclavian  . IR AV DIALY SHUNT INTRO NEEDLE/INTRACATH INITIAL W/PTA/IMG RIGHT Right 02/05/2018  . IR FLUORO GUIDE CV LINE RIGHT  01/31/2020  . IR THROMBECTOMY AV FISTULA W/THROMBOLYSIS/PTA INC/SHUNT/IMG LEFT Left 08/24/2018  . IR THROMBECTOMY AV FISTULA W/THROMBOLYSIS/PTA INC/SHUNT/IMG LEFT Left 01/06/2019  . IR US GUIDE VASC ACCESS LEFT  08/24/2018  . IR US GUIDE VASC ACCESS RIGHT  02/05/2018  . IR US GUIDE VASC ACCESS  RIGHT  01/31/2020  . IRRIGATION AND DEBRIDEMENT FOOT Right 10/23/2018   Procedure: Irrigation and Debridement to tendon, Left Foot;  Surgeon: Evelina Bucy, DPM;  Location: [place];  Service: Podiatry;  Laterality: Right;  . IRRIGATION AND DEBRIDEMENT FOOT Left 11/01/2018   Procedure: IRRIGATION AND DEBRIDEMENT PARTIAL WOUND CLOSURE LOCAL TISSUE TRANSFER AND FLAP ROTATION, LEFT FOOT;  Surgeon: Evelina Bucy, DPM;  Location: [place];  Service: Podiatry;  Laterality: Left;  . TRANSMETATARSAL AMPUTATION N/A 08/18/2018   Procedure: IRRIGATION AND DEBRIDEMENT OF LEFT 5TH TOE AND TRANSMETATARSAL, WITH PARTICAL LEFT 5TH TOE AND METATARSAL AMPUTATION, BONE BIOPSY, WOUND VAC APPLICATION.;  Surgeon: Evelina Bucy, DPM;  Location: [place];  Service: Podiatry;  Laterality: N/A;  . UPPER EXTREMITY VENOGRAPHY N/A 11/16/2016   Procedure: Upper Extremity Venography - Right Central;  Surgeon: Elam Dutch, MD;  Location: [place] CV [facility];  Service: Cardiovascular;  Laterality: N/A;  . UPPER EXTREMITY VENOGRAPHY N/A 05/25/2018   Procedure: UPPER EXTREMITY VENOGRAPHY - Bilateral;  Surgeon: Marty Heck, MD;  Location: [place] [facility];  Service: Cardiovascular;  Laterality: N/A;      Family History  Problem Relation Age of Onset  . Diabetes Mellitus II Other   . Diabetes Father   . Renal Disease Father        ESRD      reports that he has never smoked. He has never used smokeless tobacco. He reports that he does not drink alcohol and does not use drugs.  No Known Allergies  Prior to Admission medications   Medication Sig Start Date End Date Taking? Authorizing Provider  albuterol (VENTOLIN HFA) 108 (90 Base) MCG/ACT inhaler Inhale 2 puffs into the lungs every 6 (six) hours as needed for wheezing or shortness of breath. 02/18/20  Yes Thurnell Lose, MD  Blood Glucose Monitoring Suppl ([place]) w/Device KIT 1 Bag by Does not apply route 3 (three) times daily before meals.  05/02/20  Yes Kerin Perna, NP  calcitRIOL (ROCALTROL) 0.5 MCG capsule Take 5 capsules (2.5 mcg total) by mouth every Monday, Wednesday, and Friday with hemodialysis. 10/26/18  Yes Hongalgi, Lenis Dickinson, MD  cinacalcet (SENSIPAR) 60 MG tablet SMARTSIG:2 Tablet(s) By Mouth Every Evening 04/24/20  Yes [provider]  doxycycline (VIBRA-TABS) 100 MG tablet Take 100 mg by mouth 2 (two) times daily. 08/30/20  Yes [provider]  glucose blood (ONETOUCH VERIO) test strip Check blood sugars three times daily 07/01/20  Yes Edwards, Sharyn Lull P, NP  insulin glargine (LANTUS SOLOSTAR) 100 UNIT/ML Solostar Pen Inject 12 Units into the skin daily. Patient taking differently: Inject 6 Units into the skin at bedtime. 02/13/20  Yes Kerin Perna, NP  Lancets [facility] DELICA PLUS [id number]) MISC Apply topically. 05/02/20  Yes [provider]  Methoxy PEG-Epoetin Beta (MIRCERA IJ) Dialysis med 04/19/20 04/18/21 Yes [provider]  NOVOLOG 100 UNIT/ML injection INJECT 6 UNITS SUBCUTANEOUSLY TWICE DAILY WITH A MEAL  07/19/20  Yes Kerin Perna, NP  sevelamer carbonate (RENVELA) 800 MG tablet Take 2,400-4,000 mg by mouth See admin instructions. Take 5 tablets with meals then take 3 tablets with snacks 04/27/19  Yes [provider]  becaplermin (REGRANEX) 0.01 % gel Apply 1 application topically daily. Patient not taking: No sig reported 08/31/20   Evelina Bucy, DPM  mupirocin ointment (BACTROBAN) 2 % Apply to wound twice a day. Patient not taking: No sig reported 05/01/20   Criselda Peaches, DPM     Anti-infectives (From admission, onward)   Start     Dose/Rate Route Frequency Ordered Stop   09/11/20 1200  vancomycin (VANCOCIN) IVPB 750 mg/150 ml premix        750 mg 150 mL/hr over 60 Minutes Intravenous Every M-W-F (Hemodialysis) 09/11/20 0238     09/11/20 1130  ceFEPIme (MAXIPIME) 1 g in sodium chloride 0.9 % 100 mL IVPB        1 g 200 mL/hr over 30 Minutes  Intravenous Every 24 hours 09/11/20 0919     09/11/20 0300  piperacillin-tazobactam (ZOSYN) IVPB 2.25 g  Status:  Discontinued        2.25 g 100 mL/hr over 30 Minutes Intravenous Every 8 hours 09/11/20 0238 09/11/20 0858   09/11/20 0300  vancomycin (VANCOREADY) IVPB 1250 mg/250 mL        1,250 mg 166.7 mL/hr over 90 Minutes Intravenous  Once 09/11/20 0238 09/11/20 0347      Results for orders placed or performed during the hospital encounter of 09/10/20 (from the past 48 hour(s))  CBC with Differential     Status: Abnormal   Collection Time: 09/10/20  4:55 PM  Result Value Ref Range   WBC 31.0 (H) 4.0 - 10.5 K/uL   RBC 3.85 (L) 4.22 - 5.81 MIL/uL   Hemoglobin 10.8 (L) 13.0 - 17.0 g/dL   HCT 36.1 (L) 39.0 - 52.0 %   MCV 93.8 80.0 - 100.0 fL   MCH 28.1 26.0 - 34.0 pg   MCHC 29.9 (L) 30.0 - 36.0 g/dL   RDW 13.7 11.5 - 15.5 %   Platelets 203 150 - 400 K/uL   nRBC 0.0 0.0 - 0.2 %   Neutrophils Relative % 93 %   Neutro Abs 28.8 (H) 1.7 - 7.7 K/uL   Lymphocytes Relative 3 %   Lymphs Abs 0.9 0.7 - 4.0 K/uL   Monocytes Relative 3 %   Monocytes Absolute 0.9 0.1 - 1.0 K/uL   Eosinophils Relative 0 %   Eosinophils Absolute 0.0 0.0 - 0.5 K/uL   Basophils Relative 1 %   Basophils Absolute 0.3 (H) 0.0 - 0.1 K/uL   nRBC 0 0 /100 WBC   Abs Immature Granulocytes 0.00 0.00 - 0.07 K/uL   Polychromasia PRESENT     Comment: Performed at [facility], [address] [place], [place] [address]  Comprehensive metabolic panel     Status: Abnormal   Collection Time: 09/10/20  4:55 PM  Result Value Ref Range   Sodium 128 (L) 135 - 145 mmol/L   Potassium 5.1 3.5 - 5.1 mmol/L   Chloride 87 (L) 98 - 111 mmol/L   CO2 19 (L) 22 - 32 mmol/L   Glucose, Bld 328 (H) 70 - 99 mg/dL    Comment: Glucose reference range applies only to samples taken after fasting for at least 8 hours.   BUN 42 (H) 6 - 20 mg/dL   Creatinine, Ser 8.65 (H) 0.61 -  1.24 mg/dL   Calcium 8.9 8.9 - 10.3 mg/dL   Total Protein  7.5 6.5 - 8.1 g/dL   Albumin 2.4 (L) 3.5 - 5.0 g/dL   AST 14 (L) 15 - 41 U/L   ALT 12 0 - 44 U/L   Alkaline Phosphatase 180 (H) 38 - 126 U/L   Total Bilirubin 2.4 (H) 0.3 - 1.2 mg/dL   GFR, Estimated 7 (L) >60 mL/min    Comment: (NOTE) Calculated using the CKD-EPI Creatinine Equation (2021)    Anion gap 22 (H) 5 - 15    Comment: Performed at [facility], [place] [address], [place], [place] [address]  Lactic acid, plasma     Status: Abnormal   Collection Time: 09/10/20  4:56 PM  Result Value Ref Range   Lactic Acid, Venous 2.0 (HH) 0.5 - 1.9 mmol/L    Comment: CRITICAL RESULT CALLED TO, READ BACK BY AND VERIFIED WITH: K.FIELDS,RN _0  09/10/2020 [email or web address].J Performed at [facility], [place] [address] [place], [place] [address]   Blood culture (routine x 2)     Status: None (Preliminary result)   Collection Time: 09/10/20  4:56 PM   Specimen: BLOOD  Result Value Ref Range   Specimen Description BLOOD SITE NOT SPECIFIED    Special Requests      BOTTLES DRAWN AEROBIC AND ANAEROBIC Blood Culture adequate volume   Culture      NO GROWTH < 24 HOURS Performed at [facility], [place] [address], [place], [place] [address]    Report Status PENDING   CBG monitoring, ED     Status: Abnormal   Collection Time: 09/10/20  8:20 PM  Result Value Ref Range   Glucose-Capillary 212 (H) 70 - 99 mg/dL    Comment: Glucose reference range applies only to samples taken after fasting for at least 8 hours.  Lactic acid, plasma     Status: None   Collection Time: 09/11/20  3:03 AM  Result Value Ref Range   Lactic Acid, Venous 1.7 0.5 - 1.9 mmol/L    Comment: Performed at [facility] [address], [place], [place] [address]  Resp Panel by RT-PCR (Flu A&B, Covid) Nasopharyngeal Swab     Status: None   Collection Time: 09/11/20  4:41 AM   Specimen: Nasopharyngeal Swab; Nasopharyngeal(NP) swabs in vial transport medium  Result Value Ref Range   SARS Coronavirus 2 by RT PCR NEGATIVE  NEGATIVE    Comment: (NOTE) SARS-CoV-2 target nucleic acids are NOT DETECTED.  The SARS-CoV-2 RNA is generally detectable in upper respiratory specimens during the acute phase of infection. The lowest concentration of SARS-CoV-2 viral copies this assay can detect is 138 copies/mL. A negative result does not preclude SARS-Cov-2 infection and should not be used as the sole basis for treatment or other patient management decisions. A negative result may occur with  improper specimen collection/handling, submission of specimen other than nasopharyngeal swab, presence of viral mutation(s) within the areas targeted by this assay, and inadequate number of viral copies(<138 copies/mL). A negative result must be combined with clinical observations, patient history, and epidemiological information. The expected result is Negative.  Fact Sheet for Patients:  EntrepreneurPulse.com.au  Fact Sheet for Healthcare Providers:  IncredibleEmployment.be  This test is no t yet approved or cleared by the [place] FDA and  has been authorized for detection and/or diagnosis of SARS-CoV-2 by FDA under an Emergency Use Authorization (EUA). This EUA will remain  in effect (meaning  this test can be used) for the duration of the COVID-19 declaration under Section 564(b)(1) of the Act, 21 U.S.C.section 360bbb-3(b)(1), unless the authorization is terminated  or revoked sooner.       Influenza A by PCR NEGATIVE NEGATIVE   Influenza B by PCR NEGATIVE NEGATIVE    Comment: (NOTE) The Xpert Xpress SARS-CoV-2/FLU/RSV plus assay is intended as an aid in the diagnosis of influenza from Nasopharyngeal swab specimens and should not be used as a sole basis for treatment. Nasal washings and aspirates are unacceptable for Xpert Xpress SARS-CoV-2/FLU/RSV testing.  Fact Sheet for Patients: EntrepreneurPulse.com.au  Fact Sheet for Healthcare  Providers: IncredibleEmployment.be  This test is not yet approved or cleared by the [place] FDA and has been authorized for detection and/or diagnosis of SARS-CoV-2 by FDA under an Emergency Use Authorization (EUA). This EUA will remain in effect (meaning this test can be used) for the duration of the COVID-19 declaration under Section 564(b)(1) of the Act, 21 U.S.C. section 360bbb-3(b)(1), unless the authorization is terminated or revoked.  Performed at [facility], [place] [address], [place], [place] [address]   CBC     Status: Abnormal   Collection Time: 09/11/20  5:17 AM  Result Value Ref Range   WBC 29.9 (H) 4.0 - 10.5 K/uL   RBC 3.62 (L) 4.22 - 5.81 MIL/uL   Hemoglobin 10.2 (L) 13.0 - 17.0 g/dL   HCT 33.4 (L) 39.0 - 52.0 %   MCV 92.3 80.0 - 100.0 fL   MCH 28.2 26.0 - 34.0 pg   MCHC 30.5 30.0 - 36.0 g/dL   RDW 13.8 11.5 - 15.5 %   Platelets 191 150 - 400 K/uL   nRBC 0.0 0.0 - 0.2 %    Comment: Performed at [facility], [place] [address] [place], [place] [address]  Creatinine, serum     Status: Abnormal   Collection Time: 09/11/20  5:17 AM  Result Value Ref Range   Creatinine, Ser 9.44 (H) 0.61 - 1.24 mg/dL   GFR, Estimated 7 (L) >60 mL/min    Comment: (NOTE) Calculated using the CKD-EPI Creatinine Equation (2021) Performed at [facility] [address], [place], [place] [address]     ROS: See HPI   Physical Exam: Vitals:   09/11/20 1100 09/11/20 1158  BP: (!) 115/58 118/63  Pulse: 96 99  Resp:  20  Temp:  98.3 F (36.8 C)  SpO2: 100% 100%     General: Alert adult black male NAD HEENT: [place], PERRLA, EOMI, nonicteric Neck: No JVD supple Heart: RRR no murmur rub or gallop Lungs: CTA, nonlabored breathing Abdomen: Bowel sounds normoactive, soft nontender nondistended Extremities: Trace bipedal edema left foot wrapped dressing dry clear  Skin: Warm dry no acute rash Neuro: Alert oriented x3 no acute focal deficits  appreciated Dialysis Access: Right IJ PermCath  Dialysis Orders: Center: [facility] on MWF on 4 hours 15 minutes. EDW 67 kg HD Bath 2K, 2 calcium bath, Heparin 5000 units initial then 5-mid   Calcitriol 3.0 MCG p.o. q. Dialysis  no ESA   Assessment/Plan  1. ESRD -HD MWF plan for hemodialysis today on schedule 2. Left ankle foot osteomyelitis= IV vancomycin and Zosyn per admit and Dr. March Rummage podiatry seeing patient 3. Hypertension/volume  -current volume stable and BP UF to EDW as able continue home meds 4. Anemia  -Hgb stable without ESA needs 5. Metabolic bone disease -continue p.o. calcitriol starting next dialysis Friday and hemodialysis and  binder with diet corrected calcium 10.1, follow-up calcium phosphorus trend 6. Nutrition -carb modified renal protein supplement 7. Diabetes mellitus type 2= per admit  [name], PA-C [place] [phone] 09/11/2020, 12:01 PM

## 2020-09-11 NOTE — ED Notes (Signed)
MS Breakfast Ordered 

## 2020-09-11 NOTE — Consult Note (Signed)
WOC Nurse Consult Note: Patient receiving care in [place] to Dr. Alfredia Ferguson to order consult to Podiatry or Ortho. Out of the scope of the [place] team.  Reason for Consult: diabetic foot ulcer with osteomyelitis Wound type: Chronic foot ulcer with osteomylitis. Has been followed by podiatry which should be consulted for this wound. Currently out of the [place] scope of practice. WOC will not follow at this time.   Thank you for the consult. [place] nurse will not follow at this time.   Please re-consult the [place] team if needed.  Cathlean Marseilles Tamala Julian, MSN, RN, [place], [place], [facility] Wound Treatment Associate Pager [phone]

## 2020-09-11 NOTE — Telephone Encounter (Signed)
2nd VM left. Advised patient's mother to call with a good time to call her back to update her.

## 2020-09-11 NOTE — Consult Note (Addendum)
Hospital Consult    Reason for Consult:  Extensive left foot ulcer Requesting Physician:  [facility] MRN #:  [id number]  History of Present Illness: This is a 39 y.o. male with PMH significant for IDDM, HTN, and ESRD on HD who presents to the ED acutely ill with worsening L foot wound.  Patient has had L foot ulceration over the past year which has been managed with wound care and periodic debridements by Dr. March Rummage.  Over the past few weeks wound has looked worse and now has purulent drainage.  Workup included XR which suggests osteomyelitis of tarsometatarsal joint.  He has been started on broad spectrum IV antibiotics.  He is also experiencing nausea and vomiting.  Surgical history also significant for R 5th toe amp, and L 1st and 5th toe amp.  Past Medical History:  Diagnosis Date  . Anemia   . Diabetes mellitus without complication ([place])   . Diabetic gastroparesis ([place])   . Dialysis patient ([place])   . Hypertension   . Renal disorder    Dialysis  . Sepsis [facility])     Past Surgical History:  Procedure Laterality Date  . AMPUTATION Right 02/02/2018   Procedure: RIGHT FIFTH TOE AND METATARSAL AMPUTATION. Filetted toe flap metatarsal resection. Debridement Plantar Foot wound;  Surgeon: Evelina Bucy, DPM;  Location: [place];  Service: Podiatry;  Laterality: Right;  . AMPUTATION Left 08/20/2018   Procedure: FIFTH METATARSAL BONE BIOPSY;  Surgeon: Evelina Bucy, DPM;  Location: [place];  Service: Podiatry;  Laterality: Left;  . AMPUTATION Left 10/28/2018   Procedure: LEFT GREAT TOE AMPUTATION;  Surgeon: Evelina Bucy, DPM;  Location: [place];  Service: Podiatry;  Laterality: Left;  . APPLICATION OF WOUND VAC  02/02/2018   Procedure: APPLICATION OF WOUND VAC  Right Foot;  Surgeon: Evelina Bucy, DPM;  Location: [place];  Service: Podiatry;;  . APPLICATION OF WOUND VAC Left 10/28/2018   Procedure: APPLICATION OF WOUND VAC LEFT TOE;  Surgeon: Evelina Bucy, DPM;  Location: [place];  Service:  Podiatry;  Laterality: Left;  . APPLICATION OF WOUND VAC Left 11/01/2018   Procedure: APPLICATION OF WOUND VAC;  Surgeon: Evelina Bucy, DPM;  Location: [place];  Service: Podiatry;  Laterality: Left;  . AV FISTULA PLACEMENT     left arm.  . AV FISTULA PLACEMENT Right 12/22/2016   Procedure: INSERTION OF ARTERIOVENOUS (AV) GORE-TEX GRAFT ARM;  Surgeon: Elam Dutch, MD;  Location: [facility] OR;  Service: Vascular;  Laterality: Right;  . AV FISTULA PLACEMENT Left 05/26/2018   Procedure: INSERTION OF  ARTERIOVENOUS (AV) GORE-TEX GRAFT LEFT ARM;  Surgeon: Serafina Mitchell, MD;  Location: [place];  Service: Vascular;  Laterality: Left;  . EYE SURGERY    . I & D EXTREMITY Right 10/31/2017   Procedure: IRRIGATION AND DEBRIDEMENT RIGHT FOOT;  Surgeon: Evelina Bucy, DPM;  Location: [place];  Service: Podiatry;  Laterality: Right;  . I & D EXTREMITY Left 08/20/2018   Procedure: IRRIGATION AND DEBRIDEMENT EXTREMITY WITH SECONDARY WOUND CLOSUREAND APPLICATION OF WOUND VAC LEFT FOOT;  Surgeon: Evelina Bucy, DPM;  Location: [place];  Service: Podiatry;  Laterality: Left;  . I & D EXTREMITY Left 10/20/2018   Procedure: IRRIGATION AND DEBRIDEMENT LEFT FOOT  DEBRIDEMENT LATERAL FOOT WOUND;  Surgeon: Evelina Bucy, DPM;  Location: [place];  Service: Podiatry;  Laterality: Left;  . I & D EXTREMITY Left 10/28/2018   Procedure: IRRIGATION AND DEBRIDEMENT LEFT TOE;  Surgeon:  Evelina Bucy, DPM;  Location: [place];  Service: Podiatry;  Laterality: Left;  . INSERTION OF DIALYSIS CATHETER     Right subclavian  . IR AV DIALY SHUNT INTRO NEEDLE/INTRACATH INITIAL W/PTA/IMG RIGHT Right 02/05/2018  . IR FLUORO GUIDE CV LINE RIGHT  01/31/2020  . IR THROMBECTOMY AV FISTULA W/THROMBOLYSIS/PTA INC/SHUNT/IMG LEFT Left 08/24/2018  . IR THROMBECTOMY AV FISTULA W/THROMBOLYSIS/PTA INC/SHUNT/IMG LEFT Left 01/06/2019  . IR US GUIDE VASC ACCESS LEFT  08/24/2018  . IR US GUIDE VASC ACCESS RIGHT  02/05/2018  . IR US GUIDE VASC ACCESS RIGHT   01/31/2020  . IRRIGATION AND DEBRIDEMENT FOOT Right 10/23/2018   Procedure: Irrigation and Debridement to tendon, Left Foot;  Surgeon: Evelina Bucy, DPM;  Location: [place];  Service: Podiatry;  Laterality: Right;  . IRRIGATION AND DEBRIDEMENT FOOT Left 11/01/2018   Procedure: IRRIGATION AND DEBRIDEMENT PARTIAL WOUND CLOSURE LOCAL TISSUE TRANSFER AND FLAP ROTATION, LEFT FOOT;  Surgeon: Evelina Bucy, DPM;  Location: [place];  Service: Podiatry;  Laterality: Left;  . TRANSMETATARSAL AMPUTATION N/A 08/18/2018   Procedure: IRRIGATION AND DEBRIDEMENT OF LEFT 5TH TOE AND TRANSMETATARSAL, WITH PARTICAL LEFT 5TH TOE AND METATARSAL AMPUTATION, BONE BIOPSY, WOUND VAC APPLICATION.;  Surgeon: Evelina Bucy, DPM;  Location: [place];  Service: Podiatry;  Laterality: N/A;  . UPPER EXTREMITY VENOGRAPHY N/A 11/16/2016   Procedure: Upper Extremity Venography - Right Central;  Surgeon: Elam Dutch, MD;  Location: [place] [facility];  Service: Cardiovascular;  Laterality: N/A;  . UPPER EXTREMITY VENOGRAPHY N/A 05/25/2018   Procedure: UPPER EXTREMITY VENOGRAPHY - Bilateral;  Surgeon: Marty Heck, MD;  Location: [place] [facility];  Service: Cardiovascular;  Laterality: N/A;    No Known Allergies  Prior to Admission medications   Medication Sig Start Date End Date Taking? Authorizing Provider  albuterol (VENTOLIN HFA) 108 (90 Base) MCG/ACT inhaler Inhale 2 puffs into the lungs every 6 (six) hours as needed for wheezing or shortness of breath. 02/18/20  Yes Thurnell Lose, MD  Blood Glucose Monitoring Suppl ([place]) w/Device KIT 1 Bag by Does not apply route 3 (three) times daily before meals. 05/02/20  Yes Kerin Perna, NP  calcitRIOL (ROCALTROL) 0.5 MCG capsule Take 5 capsules (2.5 mcg total) by mouth every Monday, Wednesday, and Friday with hemodialysis. 10/26/18  Yes Hongalgi, Lenis Dickinson, MD  cinacalcet (SENSIPAR) 60 MG tablet SMARTSIG:2 Tablet(s) By Mouth Every Evening 04/24/20   Yes [provider]  doxycycline (VIBRA-TABS) 100 MG tablet Take 100 mg by mouth 2 (two) times daily. 08/30/20  Yes [provider]  glucose blood (ONETOUCH VERIO) test strip Check blood sugars three times daily 07/01/20  Yes Edwards, Sharyn Lull P, NP  insulin glargine (LANTUS SOLOSTAR) 100 UNIT/ML Solostar Pen Inject 12 Units into the skin daily. Patient taking differently: Inject 6 Units into the skin at bedtime. 02/13/20  Yes Kerin Perna, NP  Lancets [facility] DELICA PLUS [id number]) MISC Apply topically. 05/02/20  Yes [provider]  Methoxy PEG-Epoetin Beta (MIRCERA IJ) Dialysis med 04/19/20 04/18/21 Yes [provider]  NOVOLOG 100 UNIT/ML injection INJECT 6 UNITS SUBCUTANEOUSLY TWICE DAILY WITH A MEAL 07/19/20  Yes Kerin Perna, NP  sevelamer carbonate (RENVELA) 800 MG tablet Take 2,400-4,000 mg by mouth See admin instructions. Take 5 tablets with meals then take 3 tablets with snacks 04/27/19  Yes [provider]  becaplermin (REGRANEX) 0.01 % gel Apply 1 application topically daily. Patient not taking: No sig reported 08/31/20  Evelina Bucy, DPM  mupirocin ointment (BACTROBAN) 2 % Apply to wound twice a day. Patient not taking: No sig reported 05/01/20   Criselda Peaches, DPM    Social History   Socioeconomic History  . Marital status: Single    Spouse name: Not on file  . Number of children: Not on file  . Years of education: Not on file  . Highest education level: Not on file  Occupational History  . Not on file  Tobacco Use  . Smoking status: Never Smoker  . Smokeless tobacco: Never Used  Vaping Use  . Vaping Use: Never used  Substance and Sexual Activity  . Alcohol use: No  . Drug use: No  . Sexual activity: Never  Other Topics Concern  . Not on file  Social History Narrative  . Not on file   Social Determinants of Health   Financial Resource Strain: Not on file  Food Insecurity: Not on file  Transportation  Needs: Not on file  Physical Activity: Not on file  Stress: Not on file  Social Connections: Not on file  Intimate Partner Violence: Not on file     Family History  Problem Relation Age of Onset  . Diabetes Mellitus II Other   . Diabetes Father   . Renal Disease Father        ESRD    ROS: Otherwise negative unless mentioned in HPI  Physical Examination  Vitals:   09/11/20 1158 09/11/20 1200  BP: 118/63 118/63  Pulse: 99 (!) 102  Resp: 20   Temp: 98.3 F (36.8 C) 98.3 F (36.8 C)  SpO2: 100% 98%   Body mass index is 23.96 kg/m.  General:  WDWN in NAD Gait: Not observed HENT: WNL, normocephalic Pulmonary: normal non-labored breathing, without Rales, rhonchi,  wheezing Cardiac: regular Abdomen: soft, NT/ND, no masses Skin: without rashes Vascular Exam/Pulses: palpable R DP pulse; palpable L popliteal and PTA pulse Extremities: purulent drainage from L lateral foot ulcer with exposed bone at wound base; abscess L wrist with surrounding erythema; ascending lymphangitis of L lower leg   Musculoskeletal: no muscle wasting or atrophy  Neurologic: A&O X 3;  No focal weakness or paresthesias are detected; speech is fluent/normal Psychiatric:  The pt has Normal affect. Lymph:  Unremarkable  CBC    Component Value Date/Time   WBC 29.9 (H) 09/11/2020 0517   RBC 3.62 (L) 09/11/2020 0517   HGB 10.2 (L) 09/11/2020 0517   HGB 9.7 (L) 08/22/2014 0447   HCT 33.4 (L) 09/11/2020 0517   HCT 30.0 (L) 08/22/2014 0447   PLT 191 09/11/2020 0517   PLT 158 08/22/2014 0447   MCV 92.3 09/11/2020 0517   MCV 97 08/22/2014 0447   MCH 28.2 09/11/2020 0517   MCHC 30.5 09/11/2020 0517   RDW 13.8 09/11/2020 0517   RDW 15.3 (H) 08/22/2014 0447   LYMPHSABS 0.9 09/10/2020 1655   LYMPHSABS 1.7 08/22/2014 0447   MONOABS 0.9 09/10/2020 1655   MONOABS 0.8 08/22/2014 0447   EOSABS 0.0 09/10/2020 1655   EOSABS 0.4 08/22/2014 0447   BASOSABS 0.3 (H) 09/10/2020 1655   BASOSABS 0.1 08/22/2014  0447    BMET    Component Value Date/Time   NA 128 (L) 09/10/2020 1655   NA 138 08/22/2014 0447   K 5.1 09/10/2020 1655   K 4.0 08/22/2014 0447   CL 87 (L) 09/10/2020 1655   CL 100 08/22/2014 0447   CO2 19 (L) 09/10/2020 1655   CO2 30  08/22/2014 0447   GLUCOSE 328 (H) 09/10/2020 1655   GLUCOSE 88 08/22/2014 0447   BUN 42 (H) 09/10/2020 1655   BUN 14 08/22/2014 0447   CREATININE 9.44 (H) 09/11/2020 0517   CREATININE 5.00 (H) 08/22/2014 0447   CALCIUM 8.9 09/10/2020 1655   CALCIUM 8.2 (L) 08/22/2014 0447   GFRNONAA 7 (L) 09/11/2020 0517   GFRNONAA 14 (L) 08/22/2014 0447   GFRNONAA 5 (L) 06/19/2014 0937   GFRAA 7 (L) 05/30/2020 1923   GFRAA 17 (L) 08/22/2014 0447   GFRAA 6 (L) 06/19/2014 0937    COAGS: Lab Results  Component Value Date   INR 1.2 07/22/2020   INR 1.0 05/30/2020   INR 1.2 04/24/2019     Non-Invasive Vascular Imaging:   XR L foot demonstrates osteomyelitis of tarsometatarsal joint   ASSESSMENT/PLAN: This is a 39 y.o. male with sepsis likely secondary to infected L foot ulcer  L foot is well perfused with palpable popliteal and PTA pulses Extensive L foot ulcer with osteomyelitis LLE not salvageable and patient will require L BKA versus AKA; currently he is adamantly against any amputation further than a partial foot.  I explained to the patient that based on location of the wound and the extent of tissue changes, a TMA would not heal and would not satisfy source control of infection.  He was also made aware that a delay in treatment of systemic infection could be fatal. Agree with broad spectrum antibiotics On call vascular surgeon Dr. Trula Slade will evaluate the patient later today   Dagoberto Ligas PA-C [facility] [phone]   I have seen and evaluated the patient and agree with the above assessment and plan.  Briefly, this is a 39 year old with a chronic left foot wound with osteomyelitis.  He does have a palpable posterior  tibial pulse.  He appears to have failed wound care.  I believe his next best option is a below-knee amputation, which he is not interested in at this time.  I will go ahead and check ABIs, since the last study I see is from 2019, at which time he had adequate blood flow for wound healing.  We will continue to follow the patient while he is in the hospital.  He was recently seen by Dr. Oneida Alar and is scheduled for a right brachiocephalic fistula on December 28.  Annamarie Major

## 2020-09-11 NOTE — ED Notes (Signed)
Lunch Tray Ordered @ 1031. 

## 2020-09-11 NOTE — Telephone Encounter (Signed)
Patient was seen and evaluated

## 2020-09-11 NOTE — Telephone Encounter (Signed)
Patients mom stated anytime today would be good time to return call, will return back to work tomorrow and will not be available

## 2020-09-11 NOTE — Progress Notes (Signed)
Pharmacy Antibiotic Note  [name] is a 39 y.o. male admitted on 09/10/2020 with L foot wound infection.  Pharmacy has been consulted for Vancomycin and Zosyn dosing. Pt with ESRD. Dialyzes MWF as o/p.  Plan: Zosyn 2.25gm IV q8h Vancomycin 1500mg  IV now then 750mg  IV qHD Will f/u HD schedule/tolerance, micro data, and pt's clinical  condition Vanc levels prn   Height: 5\' 7"  (170.2 cm) Weight: 69.4 kg (153 lb) IBW/kg (Calculated) : 66.1  Temp (24hrs), Avg:98.7 F (37.1 C), Min:98.7 F (37.1 C), Max:98.7 F (37.1 C)  Recent Labs  Lab 09/10/20 1655 09/10/20 1656  WBC 31.0*  --   CREATININE 8.65*  --   LATICACIDVEN  --  2.0*    Estimated Creatinine Clearance: 10.6 mL/min (A) (by C-G formula based on SCr of 8.65 mg/dL (H)).    No Known Allergies  Antimicrobials this admission: 12/15 Vanc >>  12/15 Zosyn >>   Microbiology results: 12/14 BCx:   Thank you for allowing pharmacy to be a part of this patient's care.  Sherlon Handing, PharmD, BCPS Please see amion for complete clinical pharmacist phone list 09/11/2020 2:25 AM

## 2020-09-11 NOTE — Procedures (Signed)
   I was present at this dialysis session, have reviewed the session itself and made  appropriate changes Kelly Splinter MD [place] pager [phone]   09/11/2020, 3:54 PM

## 2020-09-11 NOTE — ED Provider Notes (Signed)
[facility] EMERGENCY DEPARTMENT Provider Note   CSN: [id number] Arrival date & time: 09/10/20  1557     History Chief Complaint  Patient presents with  . Foot Pain    [name] is a 39 y.o. male.  The history is provided by the patient and medical records.  Foot Pain   39 year old male with history of diabetes, gastroparesis, hypertension, end-stage renal disease on hemodialysis, chronic wound to left foot, presenting to the ED with worsening foot pain.  States he has been seeing podiatry about this, Dr. March Rummage.  States wound seems to be worsening and now foot has started to become red and warm to the touch.  He denies any noted fever but does endorse some chills.  States he has been compliant with his dialysis treatment, due for treatment again tomorrow.  Past Medical History:  Diagnosis Date  . Diabetes mellitus without complication ([place])   . Diabetic gastroparesis ([place])   . Dialysis patient ([place])   . Hypertension   . Renal disorder    Dialysis  . Sepsis [facility])     Patient Active Problem List   Diagnosis Date Noted  . Volume overload 07/22/2020  . Hypocalcemia 06/07/2020  . Diabetic ulcer of heel ([place]) 05/31/2020  . Type 2 diabetes mellitus with foot ulcer ([place]) 04/23/2020  . COVID-19 02/20/2020  . Personal history of COVID-19 02/20/2020  . Diabetic ulcer of left midfoot associated with type 1 diabetes mellitus, with fat layer exposed ([place])   . Charcot's joint of foot, left   . Pneumonia due to COVID-19 virus 02/14/2020  . Cellulitis of left foot 01/31/2020  . Allergy, unspecified, initial encounter 07/17/2019  . Diarrhea 04/24/2019  . Bone erosion determined by x-ray   . Encephalopathy acute   . Diabetic ulcer of left midfoot associated with diabetes mellitus due to underlying condition, with necrosis of bone ([place])   . Open wound of left foot   . Encounter for orthopedic aftercare following surgical amputation   . Foot infection   .  Gangrene of toe of left foot ([place])   . Cellulitis 10/26/2018  . Diabetic ulcer of left midfoot associated with diabetes mellitus due to underlying condition, with bone involvement without evidence of necrosis ([place])   . Nausea vomiting and diarrhea 10/20/2018  . DM (diabetes mellitus), secondary, uncontrolled, with neurologic complications ([place])   . Headache 09/23/2018  . Acute osteomyelitis involving ankle and foot, left ([place])   . Anemia of chronic disease 08/18/2018  . Encounter for immunization 06/29/2018  . Hyperkalemia   . Unspecified protein-calorie malnutrition ([place]) 02/14/2018  . Encounter for removal of sutures 02/07/2018  . Fluid overload, unspecified 01/22/2018  . Osteomyelitis of ankle or foot, acute, right ([place]) 10/30/2017  . Cellulitis and abscess of toe of left foot   . Diabetic foot infection ([place])   . Insulin dependent type 1 diabetes mellitus ([place]) 10/25/2017  . Diabetic gastroparesis ([place]) 10/25/2017  . Sepsis ([place]) 10/25/2017  . Fever, unspecified 10/28/2016  . Iron deficiency anemia, unspecified 10/28/2016  . Other specified coagulation defects ([place]) 10/28/2016  . Pruritus, unspecified 10/28/2016  . Secondary hyperparathyroidism of renal origin ([place]) 10/28/2016  . Shortness of breath 10/28/2016  . HTN (hypertension) 08/09/2014  . ESRD on dialysis ([place]) 03/19/2014  . Metabolic acidosis 30/16/0109    Past Surgical History:  Procedure Laterality Date  . AMPUTATION Right 02/02/2018   Procedure: RIGHT FIFTH TOE AND METATARSAL AMPUTATION. Filetted toe flap metatarsal resection. Debridement Plantar Foot wound;  Surgeon: Evelina Bucy, DPM;  Location: [facility];  Service: Podiatry;  Laterality: Right;  . AMPUTATION Left 08/20/2018   Procedure: FIFTH METATARSAL BONE BIOPSY;  Surgeon: Evelina Bucy, DPM;  Location: [facility];  Service: Podiatry;  Laterality: Left;  . AMPUTATION Left 10/28/2018   Procedure: LEFT GREAT TOE AMPUTATION;  Surgeon: Evelina Bucy, DPM;   Location: [facility];  Service: Podiatry;  Laterality: Left;  . APPLICATION OF WOUND VAC  02/02/2018   Procedure: APPLICATION OF WOUND VAC  Right Foot;  Surgeon: Evelina Bucy, DPM;  Location: [facility];  Service: Podiatry;;  . APPLICATION OF WOUND VAC Left 10/28/2018   Procedure: APPLICATION OF WOUND VAC LEFT TOE;  Surgeon: Evelina Bucy, DPM;  Location: [facility];  Service: Podiatry;  Laterality: Left;  . APPLICATION OF WOUND VAC Left 11/01/2018   Procedure: APPLICATION OF WOUND VAC;  Surgeon: Evelina Bucy, DPM;  Location: [facility];  Service: Podiatry;  Laterality: Left;  . AV FISTULA PLACEMENT     left arm.  . AV FISTULA PLACEMENT Right 12/22/2016   Procedure: INSERTION OF ARTERIOVENOUS (AV) GORE-TEX GRAFT ARM;  Surgeon: Elam Dutch, MD;  Location: [facility] OR;  Service: Vascular;  Laterality: Right;  . AV FISTULA PLACEMENT Left 05/26/2018   Procedure: INSERTION OF  ARTERIOVENOUS (AV) GORE-TEX GRAFT LEFT ARM;  Surgeon: Serafina Mitchell, MD;  Location: [facility];  Service: Vascular;  Laterality: Left;  . EYE SURGERY    . I & D EXTREMITY Right 10/31/2017   Procedure: IRRIGATION AND DEBRIDEMENT RIGHT FOOT;  Surgeon: Evelina Bucy, DPM;  Location: [facility];  Service: Podiatry;  Laterality: Right;  . I & D EXTREMITY Left 08/20/2018   Procedure: IRRIGATION AND DEBRIDEMENT EXTREMITY WITH SECONDARY WOUND CLOSUREAND APPLICATION OF WOUND VAC LEFT FOOT;  Surgeon: Evelina Bucy, DPM;  Location: [facility];  Service: Podiatry;  Laterality: Left;  . I & D EXTREMITY Left 10/20/2018   Procedure: IRRIGATION AND DEBRIDEMENT LEFT FOOT  DEBRIDEMENT LATERAL FOOT WOUND;  Surgeon: Evelina Bucy, DPM;  Location: [facility];  Service: Podiatry;  Laterality: Left;  . I & D EXTREMITY Left 10/28/2018   Procedure: IRRIGATION AND DEBRIDEMENT LEFT TOE;  Surgeon: Evelina Bucy, DPM;  Location: [facility];  Service: Podiatry;  Laterality: Left;  . INSERTION OF DIALYSIS CATHETER     Right subclavian  . IR AV DIALY SHUNT INTRO NEEDLE/INTRACATH INITIAL  W/PTA/IMG RIGHT Right 02/05/2018  . IR FLUORO GUIDE CV LINE RIGHT  01/31/2020  . IR THROMBECTOMY AV FISTULA W/THROMBOLYSIS/PTA INC/SHUNT/IMG LEFT Left 08/24/2018  . IR THROMBECTOMY AV FISTULA W/THROMBOLYSIS/PTA INC/SHUNT/IMG LEFT Left 01/06/2019  . IR US GUIDE VASC ACCESS LEFT  08/24/2018  . IR US GUIDE VASC ACCESS RIGHT  02/05/2018  . IR US GUIDE VASC ACCESS RIGHT  01/31/2020  . IRRIGATION AND DEBRIDEMENT FOOT Right 10/23/2018   Procedure: Irrigation and Debridement to tendon, Left Foot;  Surgeon: Evelina Bucy, DPM;  Location: [facility];  Service: Podiatry;  Laterality: Right;  . IRRIGATION AND DEBRIDEMENT FOOT Left 11/01/2018   Procedure: IRRIGATION AND DEBRIDEMENT PARTIAL WOUND CLOSURE LOCAL TISSUE TRANSFER AND FLAP ROTATION, LEFT FOOT;  Surgeon: Evelina Bucy, DPM;  Location: [facility];  Service: Podiatry;  Laterality: Left;  . TRANSMETATARSAL AMPUTATION N/A 08/18/2018   Procedure: IRRIGATION AND DEBRIDEMENT OF LEFT 5TH TOE AND TRANSMETATARSAL, WITH PARTICAL LEFT 5TH TOE AND METATARSAL AMPUTATION, BONE BIOPSY, WOUND VAC APPLICATION.;  Surgeon: Evelina Bucy, DPM;  Location: [facility];  Service:  Podiatry;  Laterality: N/A;  . UPPER EXTREMITY VENOGRAPHY N/A 11/16/2016   Procedure: Upper Extremity Venography - Right Central;  Surgeon: Elam Dutch, MD;  Location: [facility] CV LAB;  Service: Cardiovascular;  Laterality: N/A;  . UPPER EXTREMITY VENOGRAPHY N/A 05/25/2018   Procedure: UPPER EXTREMITY VENOGRAPHY - Bilateral;  Surgeon: Marty Heck, MD;  Location: [facility] CV [facility];  Service: Cardiovascular;  Laterality: N/A;       Family History  Problem Relation Age of Onset  . Diabetes Mellitus II Other   . Diabetes Father   . Renal Disease Father        ESRD    Social History   Tobacco Use  . Smoking status: Never Smoker  . Smokeless tobacco: Never Used  Vaping Use  . Vaping Use: Never used  Substance Use Topics  . Alcohol use: No  . Drug use: No    Home Medications Prior to  Admission medications   Medication Sig Start Date End Date Taking? Authorizing Provider  albuterol (VENTOLIN HFA) 108 (90 Base) MCG/ACT inhaler Inhale 2 puffs into the lungs every 6 (six) hours as needed for wheezing or shortness of breath. 02/18/20   Thurnell Lose, MD  becaplermin (REGRANEX) 0.01 % gel Apply 1 application topically daily. 08/31/20   Evelina Bucy, DPM  Blood Glucose Monitoring Suppl (ONETOUCH VERIO FLEX SYSTEM) w/Device KIT 1 Bag by Does not apply route 3 (three) times daily before meals. 05/02/20   Kerin Perna, NP  calcitRIOL (ROCALTROL) 0.5 MCG capsule Take 5 capsules (2.5 mcg total) by mouth every Monday, Wednesday, and Friday with hemodialysis. 10/26/18   Hongalgi, Lenis Dickinson, MD  cinacalcet (SENSIPAR) 60 MG tablet SMARTSIG:2 Tablet(s) By Mouth Every Evening 04/24/20   [provider]  glucose blood (ONETOUCH VERIO) test strip Check blood sugars three times daily 07/01/20   Kerin Perna, NP  insulin glargine (LANTUS SOLOSTAR) 100 UNIT/ML Solostar Pen Inject 12 Units into the skin daily. Patient not taking: Reported on 08/29/2020 02/13/20   Kerin Perna, NP  Lancets [facility] DELICA PLUS [id number]) MISC Apply topically. 05/02/20   [provider]  Methoxy PEG-Epoetin Beta (MIRCERA IJ) Mircera 04/19/20 04/18/21  [provider]  mupirocin ointment (BACTROBAN) 2 % Apply to wound twice a day. 05/01/20   McDonald, [name] R, DPM  NOVOLOG 100 UNIT/ML injection INJECT 6 UNITS SUBCUTANEOUSLY TWICE DAILY WITH A MEAL Patient not taking: Reported on 08/29/2020 07/19/20   Kerin Perna, NP  oxyCODONE (OXY IR/ROXICODONE) 5 MG immediate release tablet Take 5 mg by mouth 2 (two) times daily as needed. Patient not taking: Reported on 08/29/2020 02/01/20   [provider]  sevelamer carbonate (RENVELA) 800 MG tablet Take 2,400-4,000 mg by mouth See admin instructions. Take 5 tablets with meals then take 3 tablets with snacks 04/27/19   [provider]    Allergies    Patient has no known allergies.  Review of Systems   Review of Systems  Skin: Positive for wound.  All other systems reviewed and are negative.   Physical Exam Updated Vital Signs BP 96/64   Pulse (!) 109   Temp 98.7 F (37.1 C) (Oral)   Resp 18   Ht '5\' 7"'  (1.702 m)   Wt 69.4 kg   SpO2 98%   BMI 23.96 kg/m   Physical Exam Vitals and nursing note reviewed.  Constitutional:      Appearance: He is well-developed and well-nourished.  HENT:  Head: Normocephalic and atraumatic.     Mouth/Throat:     Mouth: Oropharynx is clear and moist.  Eyes:     Extraocular Movements: EOM normal.     Conjunctiva/sclera: Conjunctivae normal.     Pupils: Pupils are equal, round, and reactive to light.  Cardiovascular:     Rate and Rhythm: Normal rate and regular rhythm.     Heart sounds: Normal heart sounds.  Pulmonary:     Effort: Pulmonary effort is normal. No respiratory distress.     Breath sounds: Normal breath sounds. No rhonchi.  Abdominal:     General: Bowel sounds are normal.     Palpations: Abdomen is soft.     Tenderness: There is no abdominal tenderness. There is no rebound.  Musculoskeletal:        General: Normal range of motion.     Cervical back: Normal range of motion.     Comments: Prior left toe/metatarsal amputation, foot with large ulcerated wound to sole, purulent, foul smelling drainage noted, surrounding skin to left foot is erythematous and warm to touch  Skin:    General: Skin is warm and dry.  Neurological:     Mental Status: He is alert and oriented to person, place, and time.  Psychiatric:        Mood and Affect: Mood and affect normal.        ED Results / Procedures / Treatments   Labs (all labs ordered are listed, but only abnormal results are displayed) Labs Reviewed  CBC WITH DIFFERENTIAL/PLATELET - Abnormal; Notable for the following components:      Result Value   WBC 31.0 (*)    RBC 3.85 (*)     Hemoglobin 10.8 (*)    HCT 36.1 (*)    MCHC 29.9 (*)    Neutro Abs 28.8 (*)    Basophils Absolute 0.3 (*)    All other components within normal limits  COMPREHENSIVE METABOLIC PANEL - Abnormal; Notable for the following components:   Sodium 128 (*)    Chloride 87 (*)    CO2 19 (*)    Glucose, Bld 328 (*)    BUN 42 (*)    Creatinine, Ser 8.65 (*)    Albumin 2.4 (*)    AST 14 (*)    Alkaline Phosphatase 180 (*)    Total Bilirubin 2.4 (*)    GFR, Estimated 7 (*)    Anion gap 22 (*)    All other components within normal limits  LACTIC ACID, PLASMA - Abnormal; Notable for the following components:   Lactic Acid, Venous 2.0 (*)    All other components within normal limits  CBG MONITORING, ED - Abnormal; Notable for the following components:   Glucose-Capillary 212 (*)    All other components within normal limits  CULTURE, BLOOD (ROUTINE X 2)  CULTURE, BLOOD (ROUTINE X 2)  RESP PANEL BY RT-PCR (FLU A&B, COVID) [id number]  LACTIC ACID, PLASMA  CBC  CREATININE, SERUM    EKG None  Radiology DG Foot Complete Left  Result Date: 09/11/2020 CLINICAL DATA:  Foot ulcer EXAM: LEFT FOOT - COMPLETE 3+ VIEW COMPARISON:  05/30/2020 FINDINGS: There is a large area of ulceration of the plantar lateral left foot, in close proximity to the tarsometatarsal joint. The previously identified residual portion of the fifth metatarsal base has either been resected or has further eroded. 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 IMPRESSION: Large area of ulceration of the plantar lateral left  foot, in close proximity to the tarsometatarsal joint, where previously identified residual portion of the fifth metatarsal base has further eroded, compatible with osteomyelitis (unless there has been further resection since the prior study). Electronically Signed   By: Ulyses Jarred M.D.   On: 09/11/2020 02:54    Procedures Procedures (including critical care time)  CRITICAL  CARE Performed by: Larene Pickett   Total critical care time: 45 minutes  Critical care time was exclusive of separately billable procedures and treating other patients.  Critical care was necessary to treat or prevent imminent or life-threatening deterioration.  Critical care was time spent personally by me on the following activities: development of treatment plan with patient and/or surrogate as well as nursing, discussions with consultants, evaluation of patient's response to treatment, examination of patient, obtaining history from patient or surrogate, ordering and performing treatments and interventions, ordering and review of laboratory studies, ordering and review of radiographic studies, pulse oximetry and re-evaluation of patient's condition.   Medications Ordered in ED Medications  piperacillin-tazobactam (ZOSYN) IVPB 2.25 g (0 g Intravenous Stopped 09/11/20 0416)  vancomycin (VANCOREADY) IVPB 1250 mg/250 mL (1,250 mg Intravenous New Bag/Given 09/11/20 0417)  vancomycin (VANCOCIN) IVPB 750 mg/150 ml premix (has no administration in time range)  calcitRIOL (ROCALTROL) capsule 2.5 mcg (has no administration in time range)  cinacalcet (SENSIPAR) tablet 60 mg (has no administration in time range)  insulin glargine (LANTUS) injection 6 Units (has no administration in time range)  heparin injection 5,000 Units (has no administration in time range)  HYDROmorphone (DILAUDID) injection 1 mg (has no administration in time range)  lactated ringers infusion (has no administration in time range)  sevelamer carbonate (RENVELA) tablet 4,000 mg (has no administration in time range)  sevelamer carbonate (RENVELA) tablet 2,400 mg (has no administration in time range)  albuterol (VENTOLIN HFA) 108 (90 Base) MCG/ACT inhaler 2 puff (has no administration in time range)  acetaminophen (TYLENOL) tablet 650 mg (650 mg Oral Given 09/10/20 2102)  fentaNYL (SUBLIMAZE) injection 50 mcg (50 mcg  Intravenous Given 09/11/20 0250)    ED Course  I have reviewed the triage vital signs and the nursing notes.  Pertinent labs & imaging results that were available during my care of the patient were reviewed by me and considered in my medical decision making (see chart for details).    MDM Rules/Calculators/A&P  39 year old male presenting to the ED with infected wound of left foot. This is been managed by podiatry, Dr. March Rummage primarily.  He denies fever but reports chills and increased pain to left foot.  He is afebrile, non-toxic.  Does have erythema noted to dorsal left foot and large ulcer to sole of left foot with foul smelling, purulent drainage present.  Lactate 2.0, WBC count 31K.  X-ray today with osteomyelitis of the foot.  Wound grossly infected, will start broad spectrum vanc/zosyn.  Blood culture pending.  Will need podiatry consult in AM.  Will also need dialysis this AM to keep on normal schedule.  Discussed with hospitalist, Dr. Tonie Griffith-- will admit for ongoing care.  Final Clinical Impression(s) / ED Diagnoses Final diagnoses:  Other acute osteomyelitis of left foot [facility])    Rx / DC Orders ED Discharge Orders    None       Larene Pickett, PA-C 09/11/20 [id number]    Merryl Hacker, MD 09/15/20 [phone]

## 2020-09-11 NOTE — ED Notes (Signed)
patient mother would like a callback to get an update. [name] [phone].

## 2020-09-11 NOTE — ED Notes (Signed)
L foot ulcer noted, left open to air by MD that was at bedside.  Patient capable of standing at pivoting at bedside. Able to stand, change his brief and clean himself, given supplies for such.   Did not eat lunch.   Given fresh water.  Educated patient on pain medications and when they can be given.   No other issues noted at this time.

## 2020-09-11 NOTE — ED Notes (Signed)
Report called to 5N  Patient to be transported with all belongings

## 2020-09-11 NOTE — Progress Notes (Signed)
   09/11/20 1750  Vitals  Temp 98.3 F (36.8 C)  Temp Source Oral  BP 98/65  MAP (mmHg) 74  BP Location Right Arm  BP Method Automatic  Patient Position (if appropriate) Lying  Pulse Rate (!) 107  Pulse Rate Source Monitor  Resp (!) 21  Oxygen Therapy  SpO2 95 %  O2 Device Room Air  Post-Hemodialysis Assessment  Rinseback Volume (mL) 250 mL  KECN 247 V  Dialyzer Clearance Lightly streaked  Duration of HD Treatment -hour(s) 3 hour(s)  Hemodialysis Intake (mL) 650 mL  UF Total -Machine (mL) 2650 mL  Net UF (mL) 2000 mL  Tolerated HD Treatment Yes  Post-Hemodialysis Comments tx complete-pt stable

## 2020-09-11 NOTE — ED Notes (Addendum)
Non adherent pad and wrap placed on pt left foot. Pt ambulate to bathroom with walker.

## 2020-09-11 NOTE — H&P (Signed)
History and Physical    CALLAWAY HAILES GGY:[id number] DOB: [birth year] DOA: 09/10/2020  PCP: Kerin Perna, NP   Patient coming from:  Home  Chief Complaint: pain and ulcer in left foot with drainage  HPI: [name] is a 39 y.o. 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  States he has been seeing podiatry about this, Dr. March Rummage.  States wound seems to be worsening and is largere. Now foot has started to become red and warm to the touch with increased pain. Has developed a foul smelling drainage in past few days. He reports subjective fever and intermittent chills the past day. States he has been compliant with his dialysis treatment, due for treatment again tomorrow. He has missed some follow up with podiatry he states. Xray shows osteomyelitis in foot and pt is started on empiric antibiotics in the ER. He has elevated WBC on labs.    Review of Systems:  General: Reports fever and chills past few days. Denies weakness, weight loss, night sweats.  Denies dizziness. Reports decreased appetite HENT: Denies head trauma, headache, denies change in hearing, tinnitus.  Denies nasal congestion or bleeding.  Denies sore throat, sores in mouth.  Denies difficulty swallowing Eyes: Denies blurry vision, pain in eye, drainage.  Denies discoloration of eyes. Neck: Denies pain.  Denies swelling.  Denies pain with movement. Cardiovascular: Denies chest pain, palpitations.  Denies edema.  Denies orthopnea Respiratory: Denies shortness of breath, cough.  Denies wheezing.  Denies sputum production Gastrointestinal: Denies abdominal pain, swelling. Denies nausea, vomiting, diarrhea.  Denies melena. Musculoskeletal: Denies limitation of movement. Reports pain in left foot and leg.  Genitourinary: Denies pelvic pain.  Skin: Denies rash.  Denies  petechiae, purpura, ecchymosis. Neurological: Denies headache.  Denies syncope.  Denies seizure activity.  Denies paresthesia.  Denies slurred speech, drooping face.  Denies visual change. Psychiatric: Denies depression, anxiety. Denies hallucinations.  Past Medical History:  Diagnosis Date  . Anemia   . Diabetes mellitus without complication ([place])   . Diabetic gastroparesis ([place])   . Dialysis patient ([place])   . Hypertension   . Renal disorder    Dialysis  . Sepsis [facility])     Past Surgical History:  Procedure Laterality Date  . AMPUTATION Right 02/02/2018   Procedure: RIGHT FIFTH TOE AND METATARSAL AMPUTATION. Filetted toe flap metatarsal resection. Debridement Plantar Foot wound;  Surgeon: Evelina Bucy, DPM;  Location: [place];  Service: Podiatry;  Laterality: Right;  . AMPUTATION Left 08/20/2018   Procedure: FIFTH METATARSAL BONE BIOPSY;  Surgeon: Evelina Bucy, DPM;  Location: [place];  Service: Podiatry;  Laterality: Left;  . AMPUTATION Left 10/28/2018   Procedure: LEFT GREAT TOE AMPUTATION;  Surgeon: Evelina Bucy, DPM;  Location: [place];  Service: Podiatry;  Laterality: Left;  . APPLICATION OF WOUND VAC  02/02/2018   Procedure: APPLICATION OF WOUND VAC  Right Foot;  Surgeon: Evelina Bucy, DPM;  Location: [place];  Service: Podiatry;;  . APPLICATION OF WOUND VAC Left 10/28/2018   Procedure: APPLICATION OF WOUND VAC LEFT TOE;  Surgeon: Evelina Bucy, DPM;  Location: [place];  Service: Podiatry;  Laterality: Left;  . APPLICATION OF WOUND VAC Left 11/01/2018   Procedure: APPLICATION OF WOUND VAC;  Surgeon: Evelina Bucy, DPM;  Location: [place];  Service: Podiatry;  Laterality: Left;  . AV FISTULA PLACEMENT  left arm.  . AV FISTULA PLACEMENT Right 12/22/2016   Procedure: INSERTION OF ARTERIOVENOUS (AV) GORE-TEX GRAFT ARM;  Surgeon: Elam Dutch, MD;  Location: [facility] OR;  Service: Vascular;  Laterality: Right;  . AV FISTULA PLACEMENT Left 05/26/2018   Procedure: INSERTION OF   ARTERIOVENOUS (AV) GORE-TEX GRAFT LEFT ARM;  Surgeon: Serafina [place], MD;  Location: [place];  Service: Vascular;  Laterality: Left;  . EYE SURGERY    . I & D EXTREMITY Right 10/31/2017   Procedure: IRRIGATION AND DEBRIDEMENT RIGHT FOOT;  Surgeon: Evelina Bucy, DPM;  Location: [place];  Service: Podiatry;  Laterality: Right;  . I & D EXTREMITY Left 08/20/2018   Procedure: IRRIGATION AND DEBRIDEMENT EXTREMITY WITH SECONDARY WOUND CLOSUREAND APPLICATION OF WOUND VAC LEFT FOOT;  Surgeon: Evelina Bucy, DPM;  Location: [place];  Service: Podiatry;  Laterality: Left;  . I & D EXTREMITY Left 10/20/2018   Procedure: IRRIGATION AND DEBRIDEMENT LEFT FOOT  DEBRIDEMENT LATERAL FOOT WOUND;  Surgeon: Evelina Bucy, DPM;  Location: [place];  Service: Podiatry;  Laterality: Left;  . I & D EXTREMITY Left 10/28/2018   Procedure: IRRIGATION AND DEBRIDEMENT LEFT TOE;  Surgeon: Evelina Bucy, DPM;  Location: [place];  Service: Podiatry;  Laterality: Left;  . INSERTION OF DIALYSIS CATHETER     Right subclavian  . IR AV DIALY SHUNT INTRO NEEDLE/INTRACATH INITIAL W/PTA/IMG RIGHT Right 02/05/2018  . IR FLUORO GUIDE CV LINE RIGHT  01/31/2020  . IR THROMBECTOMY AV FISTULA W/THROMBOLYSIS/PTA INC/SHUNT/IMG LEFT Left 08/24/2018  . IR THROMBECTOMY AV FISTULA W/THROMBOLYSIS/PTA INC/SHUNT/IMG LEFT Left 01/06/2019  . IR US GUIDE VASC ACCESS LEFT  08/24/2018  . IR US GUIDE VASC ACCESS RIGHT  02/05/2018  . IR US GUIDE VASC ACCESS RIGHT  01/31/2020  . IRRIGATION AND DEBRIDEMENT FOOT Right 10/23/2018   Procedure: Irrigation and Debridement to tendon, Left Foot;  Surgeon: Evelina Bucy, DPM;  Location: [place];  Service: Podiatry;  Laterality: Right;  . IRRIGATION AND DEBRIDEMENT FOOT Left 11/01/2018   Procedure: IRRIGATION AND DEBRIDEMENT PARTIAL WOUND CLOSURE LOCAL TISSUE TRANSFER AND FLAP ROTATION, LEFT FOOT;  Surgeon: Evelina Bucy, DPM;  Location: [place];  Service: Podiatry;  Laterality: Left;  . TRANSMETATARSAL AMPUTATION N/A  08/18/2018   Procedure: IRRIGATION AND DEBRIDEMENT OF LEFT 5TH TOE AND TRANSMETATARSAL, WITH PARTICAL LEFT 5TH TOE AND METATARSAL AMPUTATION, BONE BIOPSY, WOUND VAC APPLICATION.;  Surgeon: Evelina Bucy, DPM;  Location: [place];  Service: Podiatry;  Laterality: N/A;  . UPPER EXTREMITY VENOGRAPHY N/A 11/16/2016   Procedure: Upper Extremity Venography - Right Central;  Surgeon: Elam Dutch, MD;  Location: [place] CV LAB;  Service: Cardiovascular;  Laterality: N/A;  . UPPER EXTREMITY VENOGRAPHY N/A 05/25/2018   Procedure: UPPER EXTREMITY VENOGRAPHY - Bilateral;  Surgeon: Marty Heck, MD;  Location: [place] CV LAB;  Service: Cardiovascular;  Laterality: N/A;    Social History  reports that he has never smoked. He has never used smokeless tobacco. He reports that he does not drink alcohol and does not use drugs.  No Known Allergies  Family History  Problem Relation Age of Onset  . Diabetes Mellitus II Other   . Diabetes Father   . Renal Disease Father        ESRD     Prior to Admission medications   Medication Sig Start Date End Date Taking? Authorizing Provider  albuterol (VENTOLIN HFA) 108 (90 Base) MCG/ACT inhaler Inhale 2 puffs into the lungs every  6 (six) hours as needed for wheezing or shortness of breath. 02/18/20  Yes Thurnell Lose, MD  Blood Glucose Monitoring Suppl ([place]) w/Device KIT 1 Bag by Does not apply route 3 (three) times daily before meals. 05/02/20  Yes Kerin Perna, NP  calcitRIOL (ROCALTROL) 0.5 MCG capsule Take 5 capsules (2.5 mcg total) by mouth every Monday, Wednesday, and Friday with hemodialysis. 10/26/18  Yes Hongalgi, Lenis Dickinson, MD  cinacalcet (SENSIPAR) 60 MG tablet SMARTSIG:2 Tablet(s) By Mouth Every Evening 04/24/20  Yes [provider]  doxycycline (VIBRA-TABS) 100 MG tablet Take 100 mg by mouth 2 (two) times daily. 08/30/20  Yes [provider]  glucose blood (ONETOUCH VERIO) test strip Check  blood sugars three times daily 07/01/20  Yes Edwards, Sharyn Lull P, NP  insulin glargine (LANTUS SOLOSTAR) 100 UNIT/ML Solostar Pen Inject 12 Units into the skin daily. Patient taking differently: Inject 6 Units into the skin at bedtime. 02/13/20  Yes Kerin Perna, NP  Lancets [facility] DELICA PLUS [id number]) MISC Apply topically. 05/02/20  Yes [provider]  Methoxy PEG-Epoetin Beta (MIRCERA IJ) Dialysis med 04/19/20 04/18/21 Yes [provider]  NOVOLOG 100 UNIT/ML injection INJECT 6 UNITS SUBCUTANEOUSLY TWICE DAILY WITH A MEAL 07/19/20  Yes Kerin Perna, NP  sevelamer carbonate (RENVELA) 800 MG tablet Take 2,400-4,000 mg by mouth See admin instructions. Take 5 tablets with meals then take 3 tablets with snacks 04/27/19  Yes [provider]  becaplermin (REGRANEX) 0.01 % gel Apply 1 application topically daily. Patient not taking: No sig reported 08/31/20   Evelina Bucy, DPM  mupirocin ointment (BACTROBAN) 2 % Apply to wound twice a day. Patient not taking: No sig reported 05/01/20   Criselda Peaches, DPM    Physical Exam: Vitals:   09/10/20 1602 09/10/20 1857 09/10/20 2335 09/11/20 0342  BP: 102/66 107/69 96/64 110/66  Pulse: (!) 109 (!) 119 (!) 109 (!) 118  Resp: '20 20 18 18  ' Temp: 98.7 F (37.1 C)     TempSrc: Oral     SpO2: 100% 100% 98% 100%  Weight: 69.4 kg     Height: '5\' 7"'  (1.702 m)       Constitutional: NAD, calm, comfortable Vitals:   09/10/20 1602 09/10/20 1857 09/10/20 2335 09/11/20 0342  BP: 102/66 107/69 96/64 110/66  Pulse: (!) 109 (!) 119 (!) 109 (!) 118  Resp: '20 20 18 18  ' Temp: 98.7 F (37.1 C)     TempSrc: Oral     SpO2: 100% 100% 98% 100%  Weight: 69.4 kg     Height: '5\' 7"'  (1.702 m)      General: WDWN, Alert and oriented x3.  Eyes: EOMI, PERRL, conjunctivae normal.  Sclera nonicteric HENT:  [place]/AT, external ears normal.  Nares patent without epistasis.  Mucous membranes are moist. Posterior pharynx clear of any exudate  or lesions. Neck: Soft, normal range of motion, supple, no masses, no thyromegaly. Trachea midline Respiratory: clear to auscultation bilaterally, no wheezing, no crackles. Normal respiratory effort. No accessory muscle use.  Cardiovascular: Sinus tachycardia, no murmurs / rubs / gallops.  Abdomen: Soft, no tenderness, nondistended, no rebound or guarding.  No masses palpated. Bowel sounds normoactive Musculoskeletal:  Moves all 4 extremities. Has left toe/metatarsal amputation with midfoot plantar surface with large ulcer with foul smelling purulent drainage with surrounding erythema. No cyanosis. Normal muscle tone.  Skin: Warm, dry, intact no rashes.  No petechiae or purpura. Neurologic: CN 2-12 grossly intact.  Normal speech.  Sensation intact, Strength 4/5 in all extremities.   Psychiatric: Normal judgment and insight.  Normal mood.    Labs on Admission: I have personally reviewed following labs and imaging studies  CBC: Recent Labs  Lab 09/10/20 1655  WBC 31.0*  NEUTROABS 28.8*  HGB 10.8*  HCT 36.1*  MCV 93.8  PLT 086    Basic Metabolic Panel: Recent Labs  Lab 09/10/20 1655  NA 128*  K 5.1  CL 87*  CO2 19*  GLUCOSE 328*  BUN 42*  CREATININE 8.65*  CALCIUM 8.9    GFR: Estimated Creatinine Clearance: 10.6 mL/min (A) (by C-G formula based on SCr of 8.65 mg/dL (H)).  Liver Function Tests: Recent Labs  Lab 09/10/20 1655  AST 14*  ALT 12  ALKPHOS 180*  BILITOT 2.4*  PROT 7.5  ALBUMIN 2.4*    Urine analysis:    Component Value Date/Time   COLORURINE YELLOW 05/26/2015 1115   APPEARANCEUR CLEAR 05/26/2015 1115   APPEARANCEUR Hazy 07/30/2014 2303   LABSPEC 1.016 05/26/2015 1115   LABSPEC 1.012 07/30/2014 2303   PHURINE 8.0 05/26/2015 1115   GLUCOSEU 500 (A) 05/26/2015 1115   GLUCOSEU >=500 07/30/2014 2303   HGBUR MODERATE (A) 05/26/2015 1115   BILIRUBINUR NEGATIVE 05/26/2015 1115   BILIRUBINUR Negative 07/30/2014 2303   KETONESUR 15 (A) 05/26/2015 1115    PROTEINUR >300 (A) 05/26/2015 1115   UROBILINOGEN 0.2 05/26/2015 1115   NITRITE NEGATIVE 05/26/2015 1115   LEUKOCYTESUR NEGATIVE 05/26/2015 1115   LEUKOCYTESUR Negative 07/30/2014 2303    Radiological Exams on Admission: DG Foot Complete Left  Result Date: 09/11/2020 CLINICAL DATA:  Foot ulcer EXAM: LEFT FOOT - COMPLETE 3+ VIEW COMPARISON:  05/30/2020 FINDINGS: There is a large area of ulceration of the plantar lateral left foot, in close proximity to the tarsometatarsal joint. The previously identified residual portion of the fifth metatarsal base has either been resected or has further eroded. 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 IMPRESSION: Large area of ulceration of the plantar lateral left foot, in close proximity to the tarsometatarsal joint, where previously identified residual portion of the fifth metatarsal base has further eroded, compatible with osteomyelitis (unless there has been further resection since the prior study). Electronically Signed   By: Ulyses Jarred M.D.   On: 09/11/2020 02:54    Assessment/Plan Principal Problem:   Acute osteomyelitis involving ankle and foot, left  Mr. Helle is admitted to med/surg floor. He is started on Vancomycin and zosyn for empiric antibiotic coverage. Pharmacy consulted to assist with antibiotic dosing.  Pain control provided and dilaudid ordered for pt for severe pain. Consult wound care.  Consult Podiatry in am for evaluation. Pt has had all podiatric care from Dr March Rummage he states.   Active Problems:   Sepsis  Pt with sepsis based on elevated WBC, tachycardia and elevated lactic acid level. Started on empiric antibiotics as above.    Diabetic ulcer of left midfoot associated with diabetes mellitus due to underlying condition, with bone involvement without evidence of necrosis  Consult wound care. Consult podiatry.    Insulin dependent type 1 diabetes mellitus  Continue basal insulin. Monitor  blood sugar qac,qhs and SSI provided as needed for glycemic control. Had HgbA1c of 7.5 2 months ago.     ESRD on dialysis Consult nephrology for dialysis in am      DVT prophylaxis: Padua score elevated. Heparin for DVT prophylaxis.  Code Status:   Full Code  Family  Communication:  Diagnosis and plan discussed with patient who verbalized understanding.  Further recommendations to follow as clinically indicated Disposition Plan:   Patient is from:  Home  Anticipated DC to:  Home  Anticipated DC date:  Anticipate greater than 2 midnight hospitalization  Anticipated DC barriers: Patient may need home health services arranged which may slow      down discharge process.  Will have social work see patient and      evaluate needs  Consults called:  Nephrology consulted by Er as pt will need dialysis in am. Podiatry will need to be    consulted in am-Dr. March Rummage. Admission status:  Inpatient   Yevonne Aline Josanna Hefel MD [facility]  How to contact the [facility] Attending or Consulting provider [place] or covering provider during after hours [place], for this patient?   1. Check the care team in [facility] and look for a) attending/consulting [facility] provider listed and b) the [facility] team listed 2. Log into [email or web address] and use [place]'s universal password to access. If you do not have the password, please contact the hospital operator. 3. Locate the [facility] provider you are looking for under [facility] and page to a number that you can be directly reached. 4. If you still have difficulty reaching the provider, please page the [facility] (Director on Call) for the Hospitalists listed on amion for assistance.  09/11/2020, 4:14 AM

## 2020-09-11 NOTE — Telephone Encounter (Signed)
Chronic diabetic foot ulcer with  Osteomyelitis now. Patient was septic from it per Dr.Sheikh.   Dr. Alfredia Ferguson  Would like for you to contact him regarding patient, stated you have contact information already. Please Advise

## 2020-09-11 NOTE — Progress Notes (Signed)
Pharmacy Antibiotic Note  [name] is a 39 y.o. male admitted on 09/10/2020 with L foot wound infection. X ray findings compatible with osteomyelitis. Pharmacy has been consulted for Vancomycin and Cefepime dosing. Patient originally on zosyn and now changing to cefepime. Pt is ESRD on HD MWF. WBC 29.9. Tmax 100.2.   Plan: Continue vancomycin 750mg  with HD MWF - plan for HD today  Start cefepime 1g q24h Monitor HD schedule, podiatry plans, cultures, and clinical progression  Height: 5\' 7"  (170.2 cm) Weight: 69.4 kg (153 lb) IBW/kg (Calculated) : 66.1  Temp (24hrs), Avg:99.1 F (37.3 C), Min:98.3 F (36.8 C), Max:100.2 F (37.9 C)  Recent Labs  Lab 09/10/20 1655 09/10/20 1656 09/11/20 0303 09/11/20 0517  WBC 31.0*  --   --  29.9*  CREATININE 8.65*  --   --  9.44*  LATICACIDVEN  --  2.0* 1.7  --     Estimated Creatinine Clearance: 9.7 mL/min (A) (by C-G formula based on SCr of 9.44 mg/dL (H)).    No Known Allergies  Antimicrobials this admission: Zosyn x1 12/15  Vancomycin 12/15 >>  Cefepime 12/15 >>   Dose adjustments this admission: N/a  Microbiology results: 12/14 bcx:  Thank you for allowing pharmacy to be a part of this patient's care.  Cristela Felt, PharmD Clinical Pharmacist  09/11/2020 9:13 AM

## 2020-09-11 NOTE — Progress Notes (Addendum)
Inpatient Diabetes Program Recommendations  AACE/ADA: New Consensus Statement on Inpatient Glycemic Control (2015)  Target Ranges:  Prepandial:   less than 140 mg/dL      Peak postprandial:   less than 180 mg/dL (1-2 hours)      Critically ill patients:  140 - 180 mg/dL   Lab Results  Component Value Date   GLUCAP 212 (H) 09/10/2020   HGBA1C 7.5 (H) 07/22/2020    Review of Glycemic Control  Diabetes history: DM1 Outpatient Diabetes medications: Lantus 6 units q hs + Novolog 6 units bid ac meal Current orders for Inpatient glycemic control: Lantus 6 units   Inpatient Diabetes Program Recommendations:   -Glycemic control order set with 0-6 units correction tid + 0-5 units q hs Secure chat sent to Dr. Alfredia Ferguson.  Spoke with patient regarding diabetes medication management.  Patient states he stopped taking Lantus due to he thought the Novolog worked better for him but the CBGs were labile. Reviewed with patient difference and need of basal verses short acting. Patient verbalized understanding. Patient states he is able to afford Lantus and Novolog but just quit taking his Lantus.   Thank you, Nani Gasser. Fabienne Nolasco, RN, MSN, CDE  Diabetes Coordinator Inpatient Glycemic Control Team Team Pager [phone] (8am-5pm) 09/11/2020 12:26 PM

## 2020-09-11 NOTE — Consult Note (Signed)
Reason for Consult: Left foot osteomyelitis Referring Physician: Raiford Noble, DO  [name] is an 39 y.o. male.  HPI: This is a 39 year old male well-known to to [place] with extensive history of wounds to bilateral feet.  He has been treated for this right foot ulcer for some time.  He has been noncompliant with therapy recommendations and dressing changes.  He was last seen in the office on 12/3 without signs of overt infection.  Was due to be seen for follow-up on 12/17 however he states 2 days ago he woke up and there was pus draining from the wound.  Past Medical History:  Diagnosis Date  . Anemia   . Diabetes mellitus without complication ([place])   . Diabetic gastroparesis ([place])   . Dialysis patient ([place])   . Hypertension   . Renal disorder    Dialysis  . Sepsis [facility])     Past Surgical History:  Procedure Laterality Date  . AMPUTATION Right 02/02/2018   Procedure: RIGHT FIFTH TOE AND METATARSAL AMPUTATION. Filetted toe flap metatarsal resection. Debridement Plantar Foot wound;  Surgeon: Evelina Bucy, DPM;  Location: [place];  Service: Podiatry;  Laterality: Right;  . AMPUTATION Left 08/20/2018   Procedure: FIFTH METATARSAL BONE BIOPSY;  Surgeon: Evelina Bucy, DPM;  Location: [place];  Service: Podiatry;  Laterality: Left;  . AMPUTATION Left 10/28/2018   Procedure: LEFT GREAT TOE AMPUTATION;  Surgeon: Evelina Bucy, DPM;  Location: [place];  Service: Podiatry;  Laterality: Left;  . APPLICATION OF WOUND VAC  02/02/2018   Procedure: APPLICATION OF WOUND VAC  Right Foot;  Surgeon: Evelina Bucy, DPM;  Location: [place];  Service: Podiatry;;  . APPLICATION OF WOUND VAC Left 10/28/2018   Procedure: APPLICATION OF WOUND VAC LEFT TOE;  Surgeon: Evelina Bucy, DPM;  Location: [place];  Service: Podiatry;  Laterality: Left;  . APPLICATION OF WOUND VAC Left 11/01/2018   Procedure: APPLICATION OF WOUND VAC;  Surgeon: Evelina Bucy, DPM;  Location: [place];  Service: Podiatry;  Laterality:  Left;  . AV FISTULA PLACEMENT     left arm.  . AV FISTULA PLACEMENT Right 12/22/2016   Procedure: INSERTION OF ARTERIOVENOUS (AV) GORE-TEX GRAFT ARM;  Surgeon: Elam Dutch, MD;  Location: [facility] OR;  Service: Vascular;  Laterality: Right;  . AV FISTULA PLACEMENT Left 05/26/2018   Procedure: INSERTION OF  ARTERIOVENOUS (AV) GORE-TEX GRAFT LEFT ARM;  Surgeon: Serafina Mitchell, MD;  Location: [place];  Service: Vascular;  Laterality: Left;  . EYE SURGERY    . I & D EXTREMITY Right 10/31/2017   Procedure: IRRIGATION AND DEBRIDEMENT RIGHT FOOT;  Surgeon: Evelina Bucy, DPM;  Location: [place];  Service: Podiatry;  Laterality: Right;  . I & D EXTREMITY Left 08/20/2018   Procedure: IRRIGATION AND DEBRIDEMENT EXTREMITY WITH SECONDARY WOUND CLOSUREAND APPLICATION OF WOUND VAC LEFT FOOT;  Surgeon: Evelina Bucy, DPM;  Location: [place];  Service: Podiatry;  Laterality: Left;  . I & D EXTREMITY Left 10/20/2018   Procedure: IRRIGATION AND DEBRIDEMENT LEFT FOOT  DEBRIDEMENT LATERAL FOOT WOUND;  Surgeon: Evelina Bucy, DPM;  Location: [place];  Service: Podiatry;  Laterality: Left;  . I & D EXTREMITY Left 10/28/2018   Procedure: IRRIGATION AND DEBRIDEMENT LEFT TOE;  Surgeon: Evelina Bucy, DPM;  Location: [place];  Service: Podiatry;  Laterality: Left;  . INSERTION OF DIALYSIS CATHETER     Right subclavian  . IR AV DIALY SHUNT INTRO  NEEDLE/INTRACATH INITIAL W/PTA/IMG RIGHT Right 02/05/2018  . IR FLUORO GUIDE CV LINE RIGHT  01/31/2020  . IR THROMBECTOMY AV FISTULA W/THROMBOLYSIS/PTA INC/SHUNT/IMG LEFT Left 08/24/2018  . IR THROMBECTOMY AV FISTULA W/THROMBOLYSIS/PTA INC/SHUNT/IMG LEFT Left 01/06/2019  . IR US GUIDE VASC ACCESS LEFT  08/24/2018  . IR US GUIDE VASC ACCESS RIGHT  02/05/2018  . IR US GUIDE VASC ACCESS RIGHT  01/31/2020  . IRRIGATION AND DEBRIDEMENT FOOT Right 10/23/2018   Procedure: Irrigation and Debridement to tendon, Left Foot;  Surgeon: Evelina Bucy, DPM;  Location: [place];  Service: Podiatry;   Laterality: Right;  . IRRIGATION AND DEBRIDEMENT FOOT Left 11/01/2018   Procedure: IRRIGATION AND DEBRIDEMENT PARTIAL WOUND CLOSURE LOCAL TISSUE TRANSFER AND FLAP ROTATION, LEFT FOOT;  Surgeon: Evelina Bucy, DPM;  Location: [place];  Service: Podiatry;  Laterality: Left;  . TRANSMETATARSAL AMPUTATION N/A 08/18/2018   Procedure: IRRIGATION AND DEBRIDEMENT OF LEFT 5TH TOE AND TRANSMETATARSAL, WITH PARTICAL LEFT 5TH TOE AND METATARSAL AMPUTATION, BONE BIOPSY, WOUND VAC APPLICATION.;  Surgeon: Evelina Bucy, DPM;  Location: [place];  Service: Podiatry;  Laterality: N/A;  . UPPER EXTREMITY VENOGRAPHY N/A 11/16/2016   Procedure: Upper Extremity Venography - Right Central;  Surgeon: Elam Dutch, MD;  Location: [place] CV LAB;  Service: Cardiovascular;  Laterality: N/A;  . UPPER EXTREMITY VENOGRAPHY N/A 05/25/2018   Procedure: UPPER EXTREMITY VENOGRAPHY - Bilateral;  Surgeon: Marty Heck, MD;  Location: [place] CV [facility];  Service: Cardiovascular;  Laterality: N/A;    Family History  Problem Relation Age of Onset  . Diabetes Mellitus II Other   . Diabetes Father   . Renal Disease Father        ESRD    Social History:  reports that he has never smoked. He has never used smokeless tobacco. He reports that he does not drink alcohol and does not use drugs.  Allergies: No Known Allergies  Medications: I have reviewed the patient's current medications.  Results for orders placed or performed during the hospital encounter of 09/10/20 (from the past 48 hour(s))  CBC with Differential     Status: Abnormal   Collection Time: 09/10/20  4:55 PM  Result Value Ref Range   WBC 31.0 (H) 4.0 - 10.5 K/uL   RBC 3.85 (L) 4.22 - 5.81 MIL/uL   Hemoglobin 10.8 (L) 13.0 - 17.0 g/dL   HCT 36.1 (L) 39.0 - 52.0 %   MCV 93.8 80.0 - 100.0 fL   MCH 28.1 26.0 - 34.0 pg   MCHC 29.9 (L) 30.0 - 36.0 g/dL   RDW 13.7 11.5 - 15.5 %   Platelets 203 150 - 400 K/uL   nRBC 0.0 0.0 - 0.2 %   Neutrophils  Relative % 93 %   Neutro Abs 28.8 (H) 1.7 - 7.7 K/uL   Lymphocytes Relative 3 %   Lymphs Abs 0.9 0.7 - 4.0 K/uL   Monocytes Relative 3 %   Monocytes Absolute 0.9 0.1 - 1.0 K/uL   Eosinophils Relative 0 %   Eosinophils Absolute 0.0 0.0 - 0.5 K/uL   Basophils Relative 1 %   Basophils Absolute 0.3 (H) 0.0 - 0.1 K/uL   nRBC 0 0 /100 WBC   Abs Immature Granulocytes 0.00 0.00 - 0.07 K/uL   Polychromasia PRESENT     Comment: Performed at [facility], [address] [place], [place] [address]  Comprehensive metabolic panel     Status: Abnormal   Collection Time: 09/10/20  4:55  PM  Result Value Ref Range   Sodium 128 (L) 135 - 145 mmol/L   Potassium 5.1 3.5 - 5.1 mmol/L   Chloride 87 (L) 98 - 111 mmol/L   CO2 19 (L) 22 - 32 mmol/L   Glucose, Bld 328 (H) 70 - 99 mg/dL    Comment: Glucose reference range applies only to samples taken after fasting for at least 8 hours.   BUN 42 (H) 6 - 20 mg/dL   Creatinine, Ser 8.65 (H) 0.61 - 1.24 mg/dL   Calcium 8.9 8.9 - 10.3 mg/dL   Total Protein 7.5 6.5 - 8.1 g/dL   Albumin 2.4 (L) 3.5 - 5.0 g/dL   AST 14 (L) 15 - 41 U/L   ALT 12 0 - 44 U/L   Alkaline Phosphatase 180 (H) 38 - 126 U/L   Total Bilirubin 2.4 (H) 0.3 - 1.2 mg/dL   GFR, Estimated 7 (L) >60 mL/min    Comment: (NOTE) Calculated using the CKD-EPI Creatinine Equation (2021)    Anion gap 22 (H) 5 - 15    Comment: Performed at [facility], [place] [address] [place], [place] [address]  Lactic acid, plasma     Status: Abnormal   Collection Time: 09/10/20  4:56 PM  Result Value Ref Range   Lactic Acid, Venous 2.0 (HH) 0.5 - 1.9 mmol/L    Comment: CRITICAL RESULT CALLED TO, READ BACK BY AND VERIFIED WITH: K.FIELDS,RN @1735  09/10/2020 [email or web address].J Performed at [facility], [place] [address], [place], [place] [address]   CBG monitoring, ED     Status: Abnormal   Collection Time: 09/10/20  8:20 PM  Result Value Ref Range   Glucose-Capillary 212 (H) 70 - 99 mg/dL    Comment:  Glucose reference range applies only to samples taken after fasting for at least 8 hours.  Lactic acid, plasma     Status: None   Collection Time: 09/11/20  3:03 AM  Result Value Ref Range   Lactic Acid, Venous 1.7 0.5 - 1.9 mmol/L    Comment: Performed at [facility] [address] [place], [place] [address]  Resp Panel by RT-PCR (Flu A&B, Covid) Nasopharyngeal Swab     Status: None   Collection Time: 09/11/20  4:41 AM   Specimen: Nasopharyngeal Swab; Nasopharyngeal(NP) swabs in vial transport medium  Result Value Ref Range   SARS Coronavirus 2 by RT PCR NEGATIVE NEGATIVE    Comment: (NOTE) SARS-CoV-2 target nucleic acids are NOT DETECTED.  The SARS-CoV-2 RNA is generally detectable in upper respiratory specimens during the acute phase of infection. The lowest concentration of SARS-CoV-2 viral copies this assay can detect is 138 copies/mL. A negative result does not preclude SARS-Cov-2 infection and should not be used as the sole basis for treatment or other patient management decisions. A negative result may occur with  improper specimen collection/handling, submission of specimen other than nasopharyngeal swab, presence of viral mutation(s) within the areas targeted by this assay, and inadequate number of viral copies(<138 copies/mL). A negative result must be combined with clinical observations, patient history, and epidemiological information. The expected result is Negative.  Fact Sheet for Patients:  EntrepreneurPulse.com.au  Fact Sheet for Healthcare Providers:  IncredibleEmployment.be  This test is no t yet approved or cleared by the [place] FDA and  has been authorized for detection and/or diagnosis of SARS-CoV-2 by FDA under an Emergency Use Authorization (EUA). This EUA will remain  in effect (meaning this test can be used)  for the duration of the COVID-19 declaration under Section 564(b)(1) of the Act,  21 U.S.C.section 360bbb-3(b)(1), unless the authorization is terminated  or revoked sooner.       Influenza A by PCR NEGATIVE NEGATIVE   Influenza B by PCR NEGATIVE NEGATIVE    Comment: (NOTE) The Xpert Xpress SARS-CoV-2/FLU/RSV plus assay is intended as an aid in the diagnosis of influenza from Nasopharyngeal swab specimens and should not be used as a sole basis for treatment. Nasal washings and aspirates are unacceptable for Xpert Xpress SARS-CoV-2/FLU/RSV testing.  Fact Sheet for Patients: EntrepreneurPulse.com.au  Fact Sheet for Healthcare Providers: IncredibleEmployment.be  This test is not yet approved or cleared by the [place] FDA and has been authorized for detection and/or diagnosis of SARS-CoV-2 by FDA under an Emergency Use Authorization (EUA). This EUA will remain in effect (meaning this test can be used) for the duration of the COVID-19 declaration under Section 564(b)(1) of the Act, 21 U.S.C. section 360bbb-3(b)(1), unless the authorization is terminated or revoked.  Performed at [facility], [place] [address], [place], [place] [address]   CBC     Status: Abnormal   Collection Time: 09/11/20  5:17 AM  Result Value Ref Range   WBC 29.9 (H) 4.0 - 10.5 K/uL   RBC 3.62 (L) 4.22 - 5.81 MIL/uL   Hemoglobin 10.2 (L) 13.0 - 17.0 g/dL   HCT 33.4 (L) 39.0 - 52.0 %   MCV 92.3 80.0 - 100.0 fL   MCH 28.2 26.0 - 34.0 pg   MCHC 30.5 30.0 - 36.0 g/dL   RDW 13.8 11.5 - 15.5 %   Platelets 191 150 - 400 K/uL   nRBC 0.0 0.0 - 0.2 %    Comment: Performed at [facility], [place] [address], [place], [place] [address]  Creatinine, serum     Status: Abnormal   Collection Time: 09/11/20  5:17 AM  Result Value Ref Range   Creatinine, Ser 9.44 (H) 0.61 - 1.24 mg/dL   GFR, Estimated 7 (L) >60 mL/min    Comment: (NOTE) Calculated using the CKD-EPI Creatinine Equation (2021) Performed at [facility] [address]  [name], [place] [address]     DG Foot Complete Left  Result Date: 09/11/2020 CLINICAL DATA:  Foot ulcer EXAM: LEFT FOOT - COMPLETE 3+ VIEW COMPARISON:  05/30/2020 FINDINGS: There is a large area of ulceration of the plantar lateral left foot, in close proximity to the tarsometatarsal joint. The previously identified residual portion of the fifth metatarsal base has either been resected or has further eroded. 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 IMPRESSION: Large area of ulceration of the plantar lateral left foot, in close proximity to the tarsometatarsal joint, where previously identified residual portion of the fifth metatarsal base has further eroded, compatible with osteomyelitis (unless there has been further resection since the prior study). Electronically Signed   By: Ulyses Jarred M.D.   On: 09/11/2020 02:54    ROS Blood pressure 112/76, pulse (!) 106, temperature 98.3 F (36.8 C), temperature source Oral, resp. rate 18, height 5\' 7"  (1.702 m), weight 69.4 kg, SpO2 100 %.  Vitals:   09/11/20 0551 09/11/20 0815  BP: 115/67 112/76  Pulse: (!) 102 (!) 106  Resp: 17 18  Temp: 100.2 F (37.9 C) 98.3 F (36.8 C)  SpO2: 100% 100%    General AA&O x3. Normal mood and affect.  Vascular Dorsalis pedis and posterior tibial pulses  present  1+ bilaterally  Capillary refill normal to all digits. Pedal hair growth absent.  Neurologic Epicritic sensation grossly absent.  Dermatologic (Wound)  large wound at the plantar lateral aspect of the left foot with purulence, necrosis and fibrosis of the wound bed, exposed bone.  This is significantly worsened and deeper since his last visit.    Orthopedic: Motor intact BLE.    Assessment/Plan:  Osteomyelitis of left foot -Imaging: Studies independently reviewed -Antibiotics: Continue empiric -WB Status: NWB LLE -Reviewed imaging studies with patient.  He does have progressive osteomyelitis of the fifth  metatarsal with near complete erosion.  Questionable involvement of the fourth and third metatarsals however hard to evaluate given Charcot changes.  It also appears that there is erosion of the cuboid.  Unfortunately given the extent of osteomyelitis I do not believe surgical intervention short of amputation of the foot is likely to resolve this issue.  He has vascular calcifications extending from the leg into the foot. -He is already established with Dr. Oneida Alar of vascular surgery.  He is pending surgery on 12/28 for a right upper extremity fistula. Recommend consult his group for evaluation of possible below-knee amputation. -Podiatry will sign off but should further services be needed do not hesitate to contact.  Evelina Bucy 09/11/2020, 9:36 AM   Best available via secure chat for questions or concerns.

## 2020-09-11 NOTE — ED Notes (Signed)
Patient medicated for pain  Patient taken to dialysis  Vanc sent with patient

## 2020-09-11 NOTE — ED Notes (Signed)
Attempted to draw 0425 labs, unsuccessful.

## 2020-09-11 NOTE — Telephone Encounter (Signed)
Attempted to call patient's mother for update per patient request. Will try again.

## 2020-09-11 NOTE — Progress Notes (Signed)
Care started prior to midnight in the emergency room patient was admitted early this morning after midnight and I am in current agreement with assessment and plan done by Dr. Harrold Donath.  Additional changes to the plan of care been made accordingly.  The patient is a 39 year old African-American male with a past medical history significant for but not limited to diabetes mellitus type 1, gastroparesis, hypertension, end-stage renal disease on hemodialysis Monday Wednesday Friday, chronic left foot diabetic wound infection who presents with worsening pain.  He has been seeing Dr. March Rummage with podiatry and patient consulted wound is been worsening and seems larger.  His foot has become erythematous and warm to touch and had some foul-smelling drainage as well.  He missed his follow-up appointment for podiatry and upon arrival to the emergency room and x-ray was done which showed osteomyelitis left foot to start on empiric antibiotics with IV vancomycin and IV Zosyn which has been changed to IV cefepime.  Podiatry has been consulted and additional wound care.  Currently is being treated for admitted for the following but not limited to:  Sepsis 2/2 Acute Osteomyelitis involving ankle and foot, left, poA -Mr. Langhorst is admitted to med/surg floor. He is started on Vancomycin and zosyn for empiric antibiotic coverage and will change to IV cefepime. Pharmacy consulted to assist with antibiotic dosing.  -Lactic acid level on admission was 2.0 and is now improved to 1.7  -DG Foot showed "Large area of ulceration of the plantar lateral left foot, in close proximity to the tarsometatarsal joint, where previously identified residual portion of the fifth metatarsal base has further eroded, compatible with osteomyelitis (unless there has been further resection since the prior study)." -Patient is currently getting IV LR at 75 mL's per hour -Met Sepsis Criteria with Tachycardia, Leukocytosis, and Source of  Infection -Pain control provided and dislaudid ordered for pt for severe pain.  In between we will start him on p.o. Oxycodone -Blood Cx x2 pending  -Consult Wound Care.  -Consult Podiatry in am for evaluation. Pt has had all podiatric care from Dr March Rummage he states.  -WBC has improved and went from 31,000 and is now 29,900 -Continue to Monitor and Trend and follow up specialist recommendations  Sepsis, poA -Pt with sepsis based on elevated WBC, tachycardia and elevated lactic acid level. -Started on empiric antibiotics as above.   Hyponatremia -Mild at 128 -Continue to monitor and trend -Likely to be replete to in Dialysis   Anemia of Chronic Kidney Disease  -Patient's Hgb/Hct went from   Diabetic ulcer of left midfoot associated with diabetes mellitus due to underlying condition, with bone involvement without evidence of necrosis  -Consult wound care. Consult podiatry.  Insulin Dependent Type 1 Diabetes Mellitus  -Continue basal insulin. Monitor blood sugar qac,qhs and SSI provided as needed for glycemic control. -Had HgbA1c of 7.5 a few months ago.   ESRD on Dialysis MWF -Presented with a BUN/creatinine 42/8.65-nephrology was consulted for further evaluation recommendations and maintenance of hemodialysis -Continue with home medications with calcitriol of 2.5 mcg every Monday Wednesday Friday, cinacalcet 60 mg p.o. daily with breakfast, as well as sevelamer 4000 mg p.o. 3 times daily with meals as well as to 2400 mg p.o. as needed given with snacks with 1200 mL fluid restriction  We will continue to monitor patient's clinical response to intervention and follow-up on specialist recommendations including nephrology as well as podiatry.

## 2020-09-12 ENCOUNTER — Other Ambulatory Visit: Payer: Self-pay

## 2020-09-12 ENCOUNTER — Other Ambulatory Visit (HOSPITAL_COMMUNITY): Payer: Self-pay

## 2020-09-12 DIAGNOSIS — I10 Essential (primary) hypertension: Secondary | ICD-10-CM | POA: Diagnosis not present

## 2020-09-12 LAB — COMPREHENSIVE METABOLIC PANEL
ALT: 12 U/L (ref 0–44)
AST: 17 U/L (ref 15–41)
Albumin: 2 g/dL — ABNORMAL LOW (ref 3.5–5.0)
Alkaline Phosphatase: 179 U/L — ABNORMAL HIGH (ref 38–126)
Anion gap: 22 — ABNORMAL HIGH (ref 5–15)
BUN: 31 mg/dL — ABNORMAL HIGH (ref 6–20)
CO2: 22 mmol/L (ref 22–32)
Calcium: 8.5 mg/dL — ABNORMAL LOW (ref 8.9–10.3)
Chloride: 89 mmol/L — ABNORMAL LOW (ref 98–111)
Creatinine, Ser: 6.07 mg/dL — ABNORMAL HIGH (ref 0.61–1.24)
GFR, Estimated: 11 mL/min — ABNORMAL LOW (ref >60)
Glucose, Bld: 189 mg/dL — ABNORMAL HIGH (ref 70–99)
Potassium: 3.6 mmol/L (ref 3.5–5.1)
Sodium: 133 mmol/L — ABNORMAL LOW (ref 135–145)
Total Bilirubin: 1.6 mg/dL — ABNORMAL HIGH (ref 0.3–1.2)
Total Protein: 6.8 g/dL (ref 6.5–8.1)

## 2020-09-12 LAB — CBC WITH DIFFERENTIAL/PLATELET
Abs Immature Granulocytes: 0.18 10*3/uL — ABNORMAL HIGH (ref 0.00–0.07)
Basophils Absolute: 0.1 10*3/uL (ref 0.0–0.1)
Basophils Relative: 0 %
Eosinophils Absolute: 0 10*3/uL (ref 0.0–0.5)
Eosinophils Relative: 0 %
HCT: 32.2 % — ABNORMAL LOW (ref 39.0–52.0)
Hemoglobin: 9.9 g/dL — ABNORMAL LOW (ref 13.0–17.0)
Immature Granulocytes: 1 %
Lymphocytes Relative: 3 %
Lymphs Abs: 0.8 10*3/uL (ref 0.7–4.0)
MCH: 28.3 pg (ref 26.0–34.0)
MCHC: 30.7 g/dL (ref 30.0–36.0)
MCV: 92 fL (ref 80.0–100.0)
Monocytes Absolute: 1.3 10*3/uL — ABNORMAL HIGH (ref 0.1–1.0)
Monocytes Relative: 6 %
Neutro Abs: 20.7 10*3/uL — ABNORMAL HIGH (ref 1.7–7.7)
Neutrophils Relative %: 90 %
Platelets: 204 10*3/uL (ref 150–400)
RBC: 3.5 MIL/uL — ABNORMAL LOW (ref 4.22–5.81)
RDW: 14 % (ref 11.5–15.5)
WBC: 23.1 10*3/uL — ABNORMAL HIGH (ref 4.0–10.5)
nRBC: 0 % (ref 0.0–0.2)

## 2020-09-12 LAB — GLUCOSE, CAPILLARY
Glucose-Capillary: 137 mg/dL — ABNORMAL HIGH (ref 70–99)
Glucose-Capillary: 243 mg/dL — ABNORMAL HIGH (ref 70–99)
Glucose-Capillary: 78 mg/dL (ref 70–99)
Glucose-Capillary: 215 mg/dL — ABNORMAL HIGH (ref 70–99)

## 2020-09-12 LAB — MAGNESIUM: Magnesium: 2.4 mg/dL (ref 1.7–2.4)

## 2020-09-12 LAB — PHOSPHORUS: Phosphorus: 7.2 mg/dL — ABNORMAL HIGH (ref 2.5–4.6)

## 2020-09-12 MED ORDER — PROSOURCE PLUS PO LIQD
30.0000 mL | Freq: Three times a day (TID) | ORAL | Status: DC
  Administered 2020-09-12 – 2020-10-11 (×55): 30 mL via ORAL
  Filled 2020-09-12 (×56): qty 30

## 2020-09-12 MED ORDER — PROSOURCE PLUS PO LIQD: 30.0000 mL | Freq: Two times a day (BID) | ORAL | Status: DC

## 2020-09-12 MED ORDER — DAKINS (1/4 STRENGTH) 0.125 % EX SOLN
1.0000 "application " | Freq: Every day | CUTANEOUS | Status: AC
  Administered 2020-09-12 – 2020-09-13 (×2): 1
  Filled 2020-09-12: qty 473

## 2020-09-12 MED ORDER — DARBEPOETIN ALFA 25 MCG/0.42ML IJ SOSY
25.0000 ug | PREFILLED_SYRINGE | INTRAMUSCULAR | Status: DC
  Administered 2020-09-13: 25 ug via INTRAVENOUS

## 2020-09-12 MED ORDER — HYDROMORPHONE HCL 1 MG/ML IJ SOLN
1.0000 mg | INTRAMUSCULAR | Status: DC | PRN
  Administered 2020-09-12 – 2020-09-16 (×7): 1 mg via INTRAVENOUS
  Filled 2020-09-12 (×10): qty 1

## 2020-09-12 NOTE — Progress Notes (Signed)
Initial Nutrition Assessment  DOCUMENTATION CODES:   Non-severe (moderate) malnutrition in context of chronic illness  INTERVENTION:   -Continue liberalized diet of regular -Continue renal MVI daily -D/c Nepro -30 ml Prosource Plus TID, each supplement provides 100 kcals and 15 grams protein  NUTRITION DIAGNOSIS:   Moderate Malnutrition related to chronic illness (ESRD on HD) as evidenced by mild fat depletion,moderate fat depletion,moderate muscle depletion,severe muscle depletion.  GOAL:   Patient will meet greater than or equal to 90% of their needs  MONITOR:   PO intake,Supplement acceptance,Labs,Weight trends,Skin,I & O's  REASON FOR ASSESSMENT:   Consult Diet education  ASSESSMENT:   [name] is a 39 y.o. 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  States he has been seeing podiatry about this, Dr. March Rummage.  States wound seems to be worsening and is largere. Now foot has started to become red and warm to the touch with increased pain. Has developed a foul smelling drainage in past few days. He reports subjective fever and intermittent chills the past day. States he has been compliant with his dialysis treatment, due for treatment again tomorrow. He has missed some follow up with podiatry he states. Xray shows osteomyelitis in foot and pt is started on empiric antibiotics in the ER. He has elevated WBC on labs.  Pt admitted with acute osteomyelitis involving lt ankle and foot.   Reviewed I/O's: -2.4 L x 24 hours and -2.1 L since admission  UOP: 600 ml x 24 hours  Spoke with pt at bedside, who was very emotional at time of visit. He expresses frustration with delay in care and difficulty obtaining appointments to manage wounds on his left wrist and foot. He also reports he has been having difficulty with vascular  access for his HD and having difficulty during treatments. Pt was very tangential at time of visit and would often joke or tell humorous stories to deflect speaking about his illnesses. When asked questions about current health state, pt would become tearful. RD provided emotional support and reassurance to pt.   Per pt, he suffers with gastroparesis and has chronic nausea and vomiting. However, this has worsened over the past 2 weeks and pt reports he has not been able to keep almost anything down during this time. Per pt, he has only consumed some cola today because he was experiencing symptoms of hypoglycemia.   Reviewed wt hx; wt has been stable over the past 6 months. Per nephrology notes, EDW 67 kg. Pt reports he has lost weight over the years, but unable to reports UBW or how much weight he has lost.  Pt shares that he has never followed a renal diet and reports that if he was placed on one, he would order outside food. Given pt's malnutrition, poor oral intake, and increased nutritional needs, agree with liberalized diet. He does not like protein shakes such as Ensure, Boost, or Nepro. He does not like any milk based foods. He is amenable to Prosource Plus supplements and reports he regularly takes these during HD treatments. RD discussed importance of good meal and supplement intake to promote healing. Pt expressed appreciation for RD visit and thanked this RD for allowing him to express his frustrations. He reports feeling a lot better after speaking with this RD.   Per podiatry notes, pt initially refused BKA, but is now agreeable.   Medications reviewed  and include renvela.   Lab Results  Component Value Date   HGBA1C 7.5 (H) 07/22/2020  PTA DM medications are 12 units insulin glargine daily and 6 units insulin aspart BID.   Labs reviewed: CBGS: [id number] (inpatient orders for glycemic control are 0-5 units insulin aspart daily at bedtime, 0-6 units insulin aspart TID with meals, and 6  units insulin glargine daily at bedtime).   NUTRITION - FOCUSED PHYSICAL EXAM:  Flowsheet Row Most Recent Value  Orbital Region Mild depletion  Upper Arm Region Moderate depletion  Thoracic and Lumbar Region Mild depletion  Buccal Region Mild depletion  Temple Region Mild depletion  Clavicle Bone Region Mild depletion  Clavicle and Acromion Bone Region Mild depletion  Scapular Bone Region Mild depletion  Dorsal Hand Mild depletion  Patellar Region Severe depletion  Anterior Thigh Region Severe depletion  Posterior Calf Region Severe depletion  Edema (RD Assessment) Mild  Hair Reviewed  Eyes Reviewed  Mouth Reviewed  Skin Reviewed  Nails Reviewed       Diet Order:   Diet Order            Diet regular Room service appropriate? Yes; Fluid consistency: Thin; Fluid restriction: 1500 mL Fluid  Diet effective now                 EDUCATION NEEDS:   Education needs have been addressed  Skin:  Skin Assessment: Reviewed RN Assessment  Last BM:  09/11/20  Height:   Ht Readings from Last 1 Encounters:  09/10/20 5\' 7"  (1.702 m)    Weight:   Wt Readings from Last 1 Encounters:  09/11/20 69.4 kg    Ideal Body Weight:  67.3 kg  BMI:  Body mass index is 23.96 kg/m.  Estimated Nutritional Needs:   Kcal:  2050-2250  Protein:  105-120 grams  Fluid:  1000 ml + UOP    Loistine Chance, RD, LDN, [place] Registered Dietitian II Certified Diabetes Care and Education Specialist Please refer to [facility] for RD and/or RD on-call/weekend/after hours pager

## 2020-09-12 NOTE — Plan of Care (Signed)
Called patient to discuss treatment options and need for amputation. We discussed the severity of his infection and the lack of options short of amputation. Patient is now agreeable to proceed with BKA. I advised him that the vascular team will discuss timing of the procedure.  I remain available should any further needs arise.  Evelina Bucy, DPM

## 2020-09-12 NOTE — Progress Notes (Addendum)
PROGRESS NOTE    [name]  ZOX:[id number] DOB: 1981/06/14 DOA: 09/10/2020 PCP: Kerin Perna, NP   Brief Narrative:  The patient is a 39 year old African-American male with a past medical history significant for but not limited to diabetes mellitus type 1, gastroparesis, hypertension, end-stage renal disease on hemodialysis Monday Wednesday Friday, chronic left foot diabetic wound infection who presents with worsening pain.  He has been seeing Dr. March Rummage with podiatry and patient consulted wound is been worsening and seems larger.  His foot has become erythematous and warm to touch and had some foul-smelling drainage as well.  He missed his follow-up appointment for podiatry and upon arrival to the emergency room and x-ray was done which showed osteomyelitis left foot to start on empiric antibiotics with IV vancomycin and IV Zosyn which has been changed to IV cefepime.  Podiatry has been consulted and additional wound care.  **Interim History Patient was seen by podiatry and podiatry feels that the foot is not salvageable and recommends vascular surgery to evaluate. Vascular surgery evaluated and recommending him left BKA however patient is refusing at this time. ABIs are being checked and vascular surgery to reevaluate today and that she will come see with the patient about attempting to try to get him to have a BKA as this would try and control the source of infection. Case was discussed with ID who also feels that the best course of action for the patient is a left BKA. Patient is being adamant about having a regular diet while hospitalized and I spoke with nephrology team and they are okay with that as they will be closely monitoring him and adjusting dialysis as necessary.  Assessment & Plan:   Principal Problem:   Acute osteomyelitis involving ankle and foot, left ([name]) Active Problems:   Insulin dependent type 1 diabetes mellitus ([name])   ESRD on dialysis ([place])   HTN  (hypertension)   Sepsis ([place])   Diabetic ulcer of left midfoot associated with diabetes mellitus due to underlying condition, with bone involvement without evidence of necrosis ([place])   Osteomyelitis of left foot ([place])  Sepsis 2/2 Acute Osteomyelitis involving ankle and foot, left, poA -[name] is admitted to med/surg floor. He is started on Vancomycin and zosyn for empiric antibiotic coverage and will change to IV cefepime. Pharmacy consulted to assist with antibiotic dosing.  -Lactic acid level on admission was 2.0 and is now improved to 1.7  -DG Foot showed "Large area of ulceration of the plantar lateral left foot, in close proximity to the tarsometatarsal joint, where previously identified residual portion of the fifth metatarsal base has further eroded, compatible with osteomyelitis (unless there has been further resection since the prior study)." -Patient was getting IV LR at 69 mL's per hour and now stopped -Met Sepsis Criteria with Tachycardia, Leukocytosis, and Source of Infection -Pain control provided and dislaudid ordered for pt for severe pain.  In between we will start him on p.o. Oxycodone -Blood Cx x2 showed NGTD at <24 hours; Unfortunately only one Cx was obtained -Consult Wound Care and out of their scope so recommended Podiatry evalaution.  -Consulted Podiatry Dr. March Rummage studies were reviewed by him and he recommends continuing empiric antibiotics. He recommended the patient be nonweightbearing on the left lower extremity. Dr. March Rummage believes that the patient will require a position of the foot to resolve this issue and feels that vascular surgery should be consulted for BKA -Vascular surgery was consulted they felt that the left lower extremity  was not salvageable and that the patient required a left BKA. Patient has not filled wound care and Dr. Trula Slade is checking ABIs. She is currently refusing a left BKA but wants input from all the specialist prior to this specifically Dr.  March Rummage -I spoke to Dr. Tommy Medal of infectious diseases and patient's chart was reviewed by him and Dr. Drucilla Schmidt feels that the patient is foot is not salvageable and feels that we could attempt to try suppressed with antibiotics but he feels that this would not work out well; he feels that delaying surgery  could potentially have the patient seed to his dialysis catheter -WBC has improved and went from 31,000 -> 29,900 -> 23,100 -Continue to Monitor and Trend and follow up specialist recommendations -Dr. March Rummage to speak with the patient about trying to convince him to get a BKA as this is the best felt option for the patient given his extent of infection to try and satisfy source control the infection. Vascular surgery has already discussed with him that the delay in treatment of systemic infection could be potentially fatal  Sepsis, poA -Pt with sepsis based on elevated WBC, tachycardia and elevated lactic acid level. -Started on empiric antibiotics as above and will continue    Hyponatremia -Mild at 128 and improved to 133 after Dialysis -Continue to monitor and trend  Anemia of Chronic Kidney Disease  -Patient's Hgb/Hct went from 10.8/36.1 -> 10.2/33.4 -> 9.9/32.2 -Check Anemia Panel in the Am -Continue to Monitor for S/Sx of Bleeding; Currently no overt bleeding noted -Repeat CBC in the AM   Diabetic ulcer of left midfoot associated with diabetes mellitus due to underlying condition, with bone involvement without evidence of necrosis  -Consult wound care. Consulted Podiatry -Podiatry feels the foot is not salvageable so recommended Vascular Surgery consult for BKA  Insulin Dependent Type 1 Diabetes Mellitus  -Continue basal insulin. Monitor blood sugar qac,qhs and SSI provided as needed for glycemic control.  -Had HgbA1c of 7.5 a few months ago. -CBG's ranging from 137 -> 266 -C/w Lantus 6 unts sq qHS and Very Sensitive Novolog SSI AC/HS  ESRD on Dialysis  MWF Hyperphosphatemia HyperMagnesemia Metabolic Acidosis -Presented with a BUN/creatinine 42/8.65 -> 59/9.90 -> and now it is 31/6.07 after Dialysis -Patient's Phos Level went from 7.8 -> 7.2 -Mag Leve was 2.5 and improved to 2.4 -Had a Metabolic Acidosis on Admission with a CO2 of 19, AG of 22, Chloride Level of 87; Now improved as CO2 is 22, AG is 22, and Chloride Level is 56 -Nephrology was consulted for further evaluation recommendations and maintenance of hemodialysis -Continue with home medications with calcitriol of 2.5 mcg every Monday Wednesday Friday, cinacalcet 60 mg p.o. daily with breakfast, as well as Sevelamer 4000 mg p.o. 3 times daily with meals as well as to 2400 mg p.o. as needed given with snacks with 1500 mL fluid restriction -He was on a Renal/Carb Modified Diet and is adamantly refusing and Threatening to leave; He states he "always gets a Regular Diet" and he understands that it would not be beneficial to him being a Diabetic and Dialysis patient being on a Regular diet but understands still wants a regular diet  Hyperbilirubinemia -Likely Reactive -T Bili on Admission was 2.4 and trended up to 2.6 and is now 1.6 -Continue to Monitor and Trend -Repeat CMP in the AM   DVT prophylaxis: Heparin 5,000 units sq 8h Code Status: FULL CODE Family Communication: Called patient's mother yesterday but she did not pick  up; will attempt again today Disposition Plan: Pending further clinical evaluation and improvement; Needs a BKA   Status is: Inpatient  Remains inpatient appropriate because:Unsafe d/c plan, IV treatments appropriate due to intensity of illness or inability to take PO and Inpatient level of care appropriate due to severity of illness   Dispo: The patient is from: Home              Anticipated d/c is to: TBD              Anticipated d/c date is: > 3 days              Patient currently is not medically stable to d/c.  Consultants:    Podiatry  Nephrology  Vascular Surgery  Discussed case with ID Dr. Tommy Medal    Procedures:  ABI's to be done  Antimicrobials:  Anti-infectives (From admission, onward)   Start     Dose/Rate Route Frequency Ordered Stop   09/11/20 1200  vancomycin (VANCOCIN) IVPB 750 mg/150 ml premix        750 mg 150 mL/hr over 60 Minutes Intravenous Every M-W-F (Hemodialysis) 09/11/20 0238     09/11/20 1130  ceFEPIme (MAXIPIME) 1 g in sodium chloride 0.9 % 100 mL IVPB        1 g 200 mL/hr over 30 Minutes Intravenous Every 24 hours 09/11/20 0919     09/11/20 0300  piperacillin-tazobactam (ZOSYN) IVPB 2.25 g  Status:  Discontinued        2.25 g 100 mL/hr over 30 Minutes Intravenous Every 8 hours 09/11/20 0238 09/11/20 0858   09/11/20 0300  vancomycin (VANCOREADY) IVPB 1250 mg/250 mL        1,250 mg 166.7 mL/hr over 90 Minutes Intravenous  Once 09/11/20 0238 09/11/20 8466        Subjective: Seen and examined at bedside he was adamant about wanting a regular diet. He is very belligerent with the staff and states that he is "going do what he wants and eat what he wants." He was concerned about his infection and wanted to speak with all specialist prior to proceeding with a left BKA but at this time he does not want it currently. Denies any chest pain but does have a lot of foot pain. No nausea or vomiting. No other concerns or complaints at this time.  Objective: Vitals:   09/11/20 1955 09/11/20 2030 09/12/20 0242 09/12/20 0844  BP: 105/67 132/66 (!) 103/58 (!) 102/59  Pulse: (!) 16 98 93 81  Resp: '18 18 16 14  ' Temp: 98.3 F (36.8 C) 98.7 F (37.1 C) 98.4 F (36.9 C) 97.8 F (36.6 C)  TempSrc: Oral Axillary Oral Oral  SpO2: 95% 93% 99% 97%  Weight:      Height:        Intake/Output Summary (Last 24 hours) at 09/12/2020 1152 Last data filed at 09/12/2020 0600 Gross per 24 hour  Intake 248.82 ml  Output 2600 ml  Net -2351.18 ml   Filed Weights   09/10/20 1602 09/11/20 1435   Weight: 69.4 kg 69.4 kg   Examination: Physical Exam:  Constitutional: WN/WD chronically ill-appearing older appearing than his stated age African-American male who is agitated and upset about his diet and appears slightly uncomfortable Eyes: Lids and conjunctivae normal, sclerae anicteric  ENMT: External Ears, Nose appear normal. Grossly normal hearing.  Neck: Appears normal, supple, no cervical masses, normal ROM, no appreciable thyromegaly: No JVD Respiratory: Diminished to auscultation bilaterally, no wheezing, rales,  rhonchi or crackles. Normal respiratory effort and patient is not tachypenic. No accessory muscle use. Unlabored breathing Cardiovascular: RRR, no murmurs / rubs / gallops. S1 and S2 auscultated. Some lower extremity edema Abdomen: Soft, non-tender, non-distended. No masses palpated.  GU: Deferred. Musculoskeletal: No clubbing / cyanosis of digits/nails. Has some toe amputations noted and left foot ulceration covered with a Mepilex. Left leg is warm and erythematous slightly Skin: Large plantar aspect diabetic foot ulcer noted with purulent drainage and some leg edema and warmth. No induration; Warm and dry.  Neurologic: CN 2-12 grossly intact with no focal deficits. Romberg sign and cerebellar reflexes not assessed.  Psychiatric: Normal judgment and insight. Alert and oriented x 3. Agitated mood and appropriate affect.   Data Reviewed: I have personally reviewed following labs and imaging studies  CBC: Recent Labs  Lab 09/10/20 1655 09/11/20 0517 09/12/20 0330  WBC 31.0* 29.9* 23.1*  NEUTROABS 28.8*  --  20.7*  HGB 10.8* 10.2* 9.9*  HCT 36.1* 33.4* 32.2*  MCV 93.8 92.3 92.0  PLT 203 191 259   Basic Metabolic Panel: Recent Labs  Lab 09/10/20 1655 09/11/20 0517 09/11/20 1453 09/12/20 0330  NA 128*  --  129* 133*  K 5.1  --  5.4* 3.6  CL 87*  --  89* 89*  CO2 19*  --  16* 22  GLUCOSE 328*  --  380* 189*  BUN 42*  --  59* 31*  CREATININE 8.65* 9.44*  9.90* 6.07*  CALCIUM 8.9  --  8.5* 8.5*  MG  --   --  2.5* 2.4  PHOS  --   --  7.8* 7.2*   GFR: Estimated Creatinine Clearance: 15.1 mL/min (A) (by C-G formula based on SCr of 6.07 mg/dL (H)). Liver Function Tests: Recent Labs  Lab 09/10/20 1655 09/11/20 1453 09/12/20 0330  AST 14* 15 17  ALT '12 12 12  ' ALKPHOS 180* 171* 179*  BILITOT 2.4* 2.6* 1.6*  PROT 7.5 6.5 6.8  ALBUMIN 2.4* 2.1* 2.0*   No results for input(s): LIPASE, AMYLASE in the last 168 hours. No results for input(s): AMMONIA in the last 168 hours. Coagulation Profile: No results for input(s): INR, PROTIME in the last 168 hours. Cardiac Enzymes: No results for input(s): CKTOTAL, CKMB, CKMBINDEX, TROPONINI in the last 168 hours. BNP (last 3 results) No results for input(s): PROBNP in the last 8760 hours. HbA1C: No results for input(s): HGBA1C in the last 72 hours. CBG: Recent Labs  Lab 09/10/20 2020 09/11/20 2035 09/12/20 0649 09/12/20 1145  GLUCAP 212* 266* 137* 243*   Lipid Profile: No results for input(s): CHOL, HDL, LDLCALC, TRIG, CHOLHDL, LDLDIRECT in the last 72 hours. Thyroid Function Tests: No results for input(s): TSH, T4TOTAL, FREET4, T3FREE, THYROIDAB in the last 72 hours. Anemia Panel: No results for input(s): VITAMINB12, FOLATE, FERRITIN, TIBC, IRON, RETICCTPCT in the last 72 hours. Sepsis Labs: Recent Labs  Lab 09/10/20 1656 09/11/20 0303  LATICACIDVEN 2.0* 1.7    Recent Results (from the past 240 hour(s))  Blood culture (routine x 2)     Status: None (Preliminary result)   Collection Time: 09/10/20  4:56 PM   Specimen: BLOOD  Result Value Ref Range Status   Specimen Description BLOOD SITE NOT SPECIFIED  Final   Special Requests   Final    BOTTLES DRAWN AEROBIC AND ANAEROBIC Blood Culture adequate volume   Culture   Final    NO GROWTH < 24 HOURS Performed at [facility], [place]  [address], [place], [place] [address]    Report Status PENDING  Incomplete  Resp Panel by RT-PCR  (Flu A&B, Covid) Nasopharyngeal Swab     Status: None   Collection Time: 09/11/20  4:41 AM   Specimen: Nasopharyngeal Swab; Nasopharyngeal(NP) swabs in vial transport medium  Result Value Ref Range Status   SARS Coronavirus 2 by RT PCR NEGATIVE NEGATIVE Final    Comment: (NOTE) SARS-CoV-2 target nucleic acids are NOT DETECTED.  The SARS-CoV-2 RNA is generally detectable in upper respiratory specimens during the acute phase of infection. The lowest concentration of SARS-CoV-2 viral copies this assay can detect is 138 copies/mL. A negative result does not preclude SARS-Cov-2 infection and should not be used as the sole basis for treatment or other patient management decisions. A negative result may occur with  improper specimen collection/handling, submission of specimen other than nasopharyngeal swab, presence of viral mutation(s) within the areas targeted by this assay, and inadequate number of viral copies(<138 copies/mL). A negative result must be combined with clinical observations, patient history, and epidemiological information. The expected result is Negative.  Fact Sheet for Patients:  EntrepreneurPulse.com.au  Fact Sheet for Healthcare Providers:  IncredibleEmployment.be  This test is no t yet approved or cleared by the [place] FDA and  has been authorized for detection and/or diagnosis of SARS-CoV-2 by FDA under an Emergency Use Authorization (EUA). This EUA will remain  in effect (meaning this test can be used) for the duration of the COVID-19 declaration under Section 564(b)(1) of the Act, 21 U.S.C.section 360bbb-3(b)(1), unless the authorization is terminated  or revoked sooner.       Influenza A by PCR NEGATIVE NEGATIVE Final   Influenza B by PCR NEGATIVE NEGATIVE Final    Comment: (NOTE) The Xpert Xpress SARS-CoV-2/FLU/RSV plus assay is intended as an aid in the diagnosis of influenza from Nasopharyngeal swab specimens  and should not be used as a sole basis for treatment. Nasal washings and aspirates are unacceptable for Xpert Xpress SARS-CoV-2/FLU/RSV testing.  Fact Sheet for Patients: EntrepreneurPulse.com.au  Fact Sheet for Healthcare Providers: IncredibleEmployment.be  This test is not yet approved or cleared by the [place] FDA and has been authorized for detection and/or diagnosis of SARS-CoV-2 by FDA under an Emergency Use Authorization (EUA). This EUA will remain in effect (meaning this test can be used) for the duration of the COVID-19 declaration under Section 564(b)(1) of the Act, 21 U.S.C. section 360bbb-3(b)(1), unless the authorization is terminated or revoked.  Performed at [facility], [place] [address] [place], [place] [address]      RN Pressure Injury Documentation:     Estimated body mass index is 23.96 kg/m as calculated from the following:   Height as of this encounter: '5\' 7"'  (1.702 m).   Weight as of this encounter: 69.4 kg.  Malnutrition Type:   Malnutrition Characteristics:   Nutrition Interventions:     Radiology Studies: DG Foot Complete Left  Result Date: 09/11/2020 CLINICAL DATA:  Foot ulcer EXAM: LEFT FOOT - COMPLETE 3+ VIEW COMPARISON:  05/30/2020 FINDINGS: There is a large area of ulceration of the plantar lateral left foot, in close proximity to the tarsometatarsal joint. The previously identified residual portion of the fifth metatarsal base has either been resected or has further eroded. 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 IMPRESSION: Large area of ulceration of the plantar lateral left foot, in close proximity to the tarsometatarsal joint, where previously identified residual portion of the fifth metatarsal  base has further eroded, compatible with osteomyelitis (unless there has been further resection since the prior study). Electronically Signed   By: Ulyses Jarred M.D.    On: 09/11/2020 02:54   Scheduled Meds: . calcitRIOL  2.5 mcg Oral Q M,W,F-HD  . Chlorhexidine Gluconate Cloth  6 each Topical Q0600  . cinacalcet  60 mg Oral Q breakfast  . feeding supplement (NEPRO CARB STEADY)  237 mL Oral BID BM  . heparin  5,000 Units Subcutaneous Q8H  . insulin aspart  0-5 Units Subcutaneous QHS  . insulin aspart  0-6 Units Subcutaneous TID WC  . insulin glargine  6 Units Subcutaneous QHS  . multivitamin  1 tablet Oral Daily  . sevelamer carbonate  4,000 mg Oral TID WC  . sodium hypochlorite  1 application Irrigation Daily   Continuous Infusions: . ceFEPime (MAXIPIME) IV Stopped (09/11/20 1244)  . vancomycin Stopped (09/11/20 1745)    LOS: 1 day   Kerney Elbe, DO [facility] PAGER is on [place]  If 7PM-7AM, please contact night-coverage [email or web address]

## 2020-09-12 NOTE — Plan of Care (Signed)

## 2020-09-12 NOTE — Progress Notes (Addendum)
Subjective: Seen in room said tolerated dialysis yesterday, states he always refuses renal diet will eat what he wants  Objective Vital signs in last 24 hours: Vitals:   09/11/20 1955 09/11/20 2030 09/12/20 0242 09/12/20 0844  BP: 105/67 132/66 (!) 103/58 (!) 102/59  Pulse: (!) 16 98 93 81  Resp: 18 18 16 14   Temp: 98.3 F (36.8 C) 98.7 F (37.1 C) 98.4 F (36.9 C) 97.8 F (36.6 C)  TempSrc: Oral Axillary Oral Oral  SpO2: 95% 93% 99% 97%  Weight:      Height:       Weight change: -0 kg  Physical Exam: General: Alert  NAD Heart: RRR no murmur rub or gallop Lungs: CTA, nonlabored breathing Abdomen: Bowel sounds normoactive, soft nontender nondistended Extremities: Trace bipedal edema left foot wrapped dressing dry clear Dialysis Access: Right IJ PermCath  Dialysis Orders: Center: [facility] on MWF on 4 hours 15 minutes. EDW 67 kg HD Bath 2K, 2 calcium bath, Heparin 5000 units initial then 5-mid   Calcitriol 3.0 MCG p.o. q. Dialysis  no ESA  Problem/Plan:  * 1. ESRD -HD MWF,  hemodialysis  on schedule using PermCath,  noted prior plans access history, OP [facility] had scheduled for R BC AVF  09/24/2020. 2. Left foot wound with osteomyelitis= IV vancomycin and Zosyn per admit and Dr. March Rummage podiatry ,VVS noted saw12/15 Dr. Trula Slade" believe his next best option is BKA which patient is not interested at this time" 3. Hypertension/volume  -current volume stable and BP UF to EDW as able continue home meds 4. Anemia of ESRD -Hgb 10.2 >9.9,  without ESA needs as OP now with developing infection start low-dose Aranesp next dialysis 5. Metabolic bone disease -continue p.o. calcitriol on HD, phosphorus 7.2 =dark cola at bedside, pt.  reminded of diet and binder compliance needed. corrected calcium 10.1, follow-up calcium phosphorus trend Renvela binder and also Sensipar 6. Nutrition -carb modified renal protein supplement Albumin 2.0 add protein supplement 7. Diabetes mellitus type 1= per  admit   [name], PA-C [facility] [phone] 09/12/2020,11:56 AM  LOS: 1 day   Labs: Basic Metabolic Panel: Recent Labs  Lab 09/10/20 1655 09/11/20 0517 09/11/20 1453 09/12/20 0330  NA 128*  --  129* 133*  K 5.1  --  5.4* 3.6  CL 87*  --  89* 89*  CO2 19*  --  16* 22  GLUCOSE 328*  --  380* 189*  BUN 42*  --  59* 31*  CREATININE 8.65* 9.44* 9.90* 6.07*  CALCIUM 8.9  --  8.5* 8.5*  PHOS  --   --  7.8* 7.2*   Liver Function Tests: Recent Labs  Lab 09/10/20 1655 09/11/20 1453 09/12/20 0330  AST 14* 15 17  ALT 12 12 12   ALKPHOS 180* 171* 179*  BILITOT 2.4* 2.6* 1.6*  PROT 7.5 6.5 6.8  ALBUMIN 2.4* 2.1* 2.0*   No results for input(s): LIPASE, AMYLASE in the last 168 hours. No results for input(s): AMMONIA in the last 168 hours. CBC: Recent Labs  Lab 09/10/20 1655 09/11/20 0517 09/12/20 0330  WBC 31.0* 29.9* 23.1*  NEUTROABS 28.8*  --  20.7*  HGB 10.8* 10.2* 9.9*  HCT 36.1* 33.4* 32.2*  MCV 93.8 92.3 92.0  PLT 203 191 204   Cardiac Enzymes: No results for input(s): CKTOTAL, CKMB, CKMBINDEX, TROPONINI in the last 168 hours. CBG: Recent Labs  Lab 09/10/20 2020 09/11/20 2035 09/12/20 0649 09/12/20 1145  GLUCAP 212* 266* 137* 243*  Studies/Results: DG Foot Complete Left  Result Date: 09/11/2020 CLINICAL DATA:  Foot ulcer EXAM: LEFT FOOT - COMPLETE 3+ VIEW COMPARISON:  05/30/2020 FINDINGS: There is a large area of ulceration of the plantar lateral left foot, in close proximity to the tarsometatarsal joint. The previously identified residual portion of the fifth metatarsal base has either been resected or has further eroded. 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 IMPRESSION: Large area of ulceration of the plantar lateral left foot, in close proximity to the tarsometatarsal joint, where previously identified residual portion of the fifth metatarsal base has further eroded, compatible with  osteomyelitis (unless there has been further resection since the prior study). Electronically Signed   By: Ulyses Jarred M.D.   On: 09/11/2020 02:54   Medications: . ceFEPime (MAXIPIME) IV 1 g (09/12/20 1154)  . vancomycin Stopped (09/11/20 1745)   . calcitRIOL  2.5 mcg Oral Q M,W,F-HD  . Chlorhexidine Gluconate Cloth  6 each Topical Q0600  . cinacalcet  60 mg Oral Q breakfast  . feeding supplement (NEPRO CARB STEADY)  237 mL Oral BID BM  . heparin  5,000 Units Subcutaneous Q8H  . insulin aspart  0-5 Units Subcutaneous QHS  . insulin aspart  0-6 Units Subcutaneous TID WC  . insulin glargine  6 Units Subcutaneous QHS  . multivitamin  1 tablet Oral Daily  . sevelamer carbonate  4,000 mg Oral TID WC  . sodium hypochlorite  1 application Irrigation Daily

## 2020-09-13 ENCOUNTER — Inpatient Hospital Stay (HOSPITAL_COMMUNITY): Payer: Medicare HMO

## 2020-09-13 ENCOUNTER — Ambulatory Visit: Payer: Medicare HMO | Admitting: Podiatry

## 2020-09-13 DIAGNOSIS — I10 Essential (primary) hypertension: Secondary | ICD-10-CM | POA: Diagnosis not present

## 2020-09-13 DIAGNOSIS — M86672 Other chronic osteomyelitis, left ankle and foot: Secondary | ICD-10-CM | POA: Diagnosis not present

## 2020-09-13 DIAGNOSIS — E440 Moderate protein-calorie malnutrition: Secondary | ICD-10-CM | POA: Insufficient documentation

## 2020-09-13 LAB — COMPREHENSIVE METABOLIC PANEL
ALT: 13 U/L (ref 0–44)
AST: 17 U/L (ref 15–41)
Albumin: 2 g/dL — ABNORMAL LOW (ref 3.5–5.0)
Alkaline Phosphatase: 164 U/L — ABNORMAL HIGH (ref 38–126)
Anion gap: 15 (ref 5–15)
BUN: 45 mg/dL — ABNORMAL HIGH (ref 6–20)
CO2: 25 mmol/L (ref 22–32)
Calcium: 9.1 mg/dL (ref 8.9–10.3)
Chloride: 87 mmol/L — ABNORMAL LOW (ref 98–111)
Creatinine, Ser: 6.99 mg/dL — ABNORMAL HIGH (ref 0.61–1.24)
GFR, Estimated: 9 mL/min — ABNORMAL LOW (ref >60)
Glucose, Bld: 177 mg/dL — ABNORMAL HIGH (ref 70–99)
Potassium: 4.1 mmol/L (ref 3.5–5.1)
Sodium: 127 mmol/L — ABNORMAL LOW (ref 135–145)
Total Bilirubin: 1.1 mg/dL (ref 0.3–1.2)
Total Protein: 6.8 g/dL (ref 6.5–8.1)

## 2020-09-13 LAB — CBC WITH DIFFERENTIAL/PLATELET
Abs Immature Granulocytes: 0.89 10*3/uL — ABNORMAL HIGH (ref 0.00–0.07)
Basophils Absolute: 0.1 10*3/uL (ref 0.0–0.1)
Basophils Relative: 0 %
Eosinophils Absolute: 0.1 10*3/uL (ref 0.0–0.5)
Eosinophils Relative: 0 %
HCT: 29 % — ABNORMAL LOW (ref 39.0–52.0)
Hemoglobin: 9.7 g/dL — ABNORMAL LOW (ref 13.0–17.0)
Immature Granulocytes: 3 %
Lymphocytes Relative: 3 %
Lymphs Abs: 0.9 10*3/uL (ref 0.7–4.0)
MCH: 29.5 pg (ref 26.0–34.0)
MCHC: 33.4 g/dL (ref 30.0–36.0)
MCV: 88.1 fL (ref 80.0–100.0)
Monocytes Absolute: 1.9 10*3/uL — ABNORMAL HIGH (ref 0.1–1.0)
Monocytes Relative: 7 %
Neutro Abs: 22.9 10*3/uL — ABNORMAL HIGH (ref 1.7–7.7)
Neutrophils Relative %: 87 %
Platelets: 201 10*3/uL (ref 150–400)
RBC: 3.29 MIL/uL — ABNORMAL LOW (ref 4.22–5.81)
RDW: 14.1 % (ref 11.5–15.5)
WBC Morphology: INCREASED
WBC: 26.7 10*3/uL — ABNORMAL HIGH (ref 4.0–10.5)
nRBC: 0 % (ref 0.0–0.2)

## 2020-09-13 LAB — FERRITIN: Ferritin: 3890 ng/mL — ABNORMAL HIGH (ref 24–336)

## 2020-09-13 LAB — IRON AND TIBC
Iron: 42 ug/dL — ABNORMAL LOW (ref 45–182)
Saturation Ratios: 34 % (ref 17.9–39.5)
TIBC: 125 ug/dL — ABNORMAL LOW (ref 250–450)
UIBC: 83 ug/dL

## 2020-09-13 LAB — RETICULOCYTES
Immature Retic Fract: 11.7 % (ref 2.3–15.9)
RBC.: 3.32 MIL/uL — ABNORMAL LOW (ref 4.22–5.81)
Retic Count, Absolute: 25.2 10*3/uL (ref 19.0–186.0)
Retic Ct Pct: 0.8 % (ref 0.4–3.1)

## 2020-09-13 LAB — GLUCOSE, CAPILLARY
Glucose-Capillary: 156 mg/dL — ABNORMAL HIGH (ref 70–99)
Glucose-Capillary: 182 mg/dL — ABNORMAL HIGH (ref 70–99)
Glucose-Capillary: 183 mg/dL — ABNORMAL HIGH (ref 70–99)

## 2020-09-13 LAB — MAGNESIUM: Magnesium: 2.3 mg/dL (ref 1.7–2.4)

## 2020-09-13 LAB — PHOSPHORUS: Phosphorus: 5.4 mg/dL — ABNORMAL HIGH (ref 2.5–4.6)

## 2020-09-13 LAB — HEPATITIS B CORE ANTIBODY, TOTAL: Hep B Core Total Ab: NONREACTIVE

## 2020-09-13 LAB — VITAMIN B12: Vitamin B-12: 1152 pg/mL — ABNORMAL HIGH (ref 180–914)

## 2020-09-13 LAB — HEPATITIS B SURFACE ANTIGEN: Hepatitis B Surface Ag: NONREACTIVE

## 2020-09-13 LAB — FOLATE: Folate: 7.6 ng/mL (ref >5.9)

## 2020-09-13 LAB — HEPATITIS B SURFACE ANTIBODY,QUALITATIVE: Hep B S Ab: REACTIVE — AB

## 2020-09-13 MED ORDER — CALCITRIOL 0.5 MCG PO CAPS
ORAL_CAPSULE | ORAL | Status: AC
  Filled 2020-09-13: qty 5

## 2020-09-13 MED ORDER — DARBEPOETIN ALFA 25 MCG/0.42ML IJ SOSY
PREFILLED_SYRINGE | INTRAMUSCULAR | Status: AC
  Filled 2020-09-13: qty 0.42

## 2020-09-13 MED ORDER — VANCOMYCIN HCL IN DEXTROSE 750-5 MG/150ML-% IV SOLN
INTRAVENOUS | Status: AC
  Filled 2020-09-13: qty 150

## 2020-09-13 MED ORDER — HYDROMORPHONE HCL 1 MG/ML IJ SOLN
INTRAMUSCULAR | Status: AC
  Filled 2020-09-13: qty 1

## 2020-09-13 MED ORDER — HEPARIN SODIUM (PORCINE) 1000 UNIT/ML IJ SOLN
INTRAMUSCULAR | Status: AC
  Administered 2020-09-13: 3800 [IU]
  Filled 2020-09-13: qty 4

## 2020-09-13 NOTE — Evaluation (Signed)
Physical Therapy Evaluation Patient Details Name: [name] MRN: [id number] DOB: 1980-11-16 Today's Date: 09/13/2020   History of Present Illness  Patient is a 39 y/o male with PMH significant for DM Type 1, gastroparesis, HTN, ESRD, chronic L foot diabetic wound infection. Patient admitted for sepsis 2/2 acute osteomyelitis involving L ankle and foot. Patient is scheduled for L BKA on 12/20 or 12/21.  Clinical Impression  PTA, patient reports independence with mobility, however has PCA that comes 40 hrs/week to help bathe "and whatever else needs to be done." Patient modI for bed mobility and supervision for sit to stand transfer with cues for hand placement. Patient ambulated 40' with minA and RW, LOB x2 with minA to recover. Patient easily distracted when OOB causing LOB. Patient presents with decreased activity tolerance, impaired balance, generalized weakness, and impaired functional mobility. Recommend HHPT following discharge to maximize functional independence and safety. PT to re-evaluate following BKA to update recommendations.     Follow Up Recommendations Home health PT;Supervision/Assistance - 24 hour    Equipment Recommendations  Rolling Coltin Casher with 5" wheels;3in1 (PT);Wheelchair (measurements PT);Wheelchair cushion (measurements PT)    Recommendations for Other Services       Precautions / Restrictions Precautions Precautions: Fall Restrictions Weight Bearing Restrictions: Yes LLE Weight Bearing: Non weight bearing      Mobility  Bed Mobility Overal bed mobility: Modified Independent                  Transfers Overall transfer level: Needs assistance   Transfers: Sit to/from Stand Sit to Stand: Supervision         General transfer comment: supervision for safety, patient able to steady self upon standing  Ambulation/Gait Ambulation/Gait assistance: Min assist Gait Distance (Feet): 25 Feet Assistive device: Rolling Luisenrique Conran (2 wheeled) Gait  Pattern/deviations: Step-to pattern;Decreased stride length     General Gait Details: LOB x 2 with minA to recover. Patient can get distracted with conversation and become unsteady  Financial trader Rankin (Stroke Patients Only)       Balance Overall balance assessment: Needs assistance Sitting-balance support: No upper extremity supported;Feet supported Sitting balance-[name]: Fair     Standing balance support: Bilateral upper extremity supported;During functional activity Standing balance-[name]: Poor Standing balance comment: single leg standing balance due to NWB on L                             Pertinent Vitals/Pain Pain Assessment: 0-10 Pain Score: 9  Pain Location: L LE and UE Pain Descriptors / Indicators: Guarding;Grimacing Pain Intervention(s): Monitored during session    Home Living Family/patient expects to be discharged to:: Private residence Living Arrangements: Parent Available Help at Discharge: Family;Available PRN/intermittently Type of Home: Apartment Home Access: Stairs to enter Entrance Stairs-Rails: Right Entrance Stairs-Number of Steps: 7 Home Layout:  (2nd floor apartment) Home Equipment: None      Prior Function Level of Independence: Needs assistance   Gait / Transfers Assistance Needed: independent  ADL's / Homemaking Assistance Needed: personal care attendant 40 hrs/week to help bathing and whatever is needed        Hand Dominance        Extremity/Trunk Assessment   Upper Extremity Assessment Upper Extremity Assessment: Defer to OT evaluation    Lower Extremity Assessment Lower Extremity Assessment: Generalized weakness       Communication  Communication: No difficulties  Cognition Arousal/Alertness: Awake/alert Behavior During Therapy: WFL for tasks assessed/performed Overall Cognitive Status: Within Functional Limits for tasks assessed                                         General Comments      Exercises     Assessment/Plan    PT Assessment Patient needs continued PT services  PT Problem List Decreased strength;Decreased range of motion;Decreased activity tolerance;Decreased balance;Decreased mobility;Decreased safety awareness       PT Treatment Interventions DME instruction;Gait training;Stair training;Functional mobility training;Therapeutic exercise;Therapeutic activities;Balance training;Patient/family education    PT Goals (Current goals can be found in the Care Plan section)  Acute Rehab PT Goals Patient Stated Goal: to go home PT Goal Formulation: With patient Time For Goal Achievement: 09/27/20 Potential to Achieve Goals: Good    Frequency Min 3X/week   Barriers to discharge Decreased caregiver support      Co-evaluation               AM-PAC PT "6 Clicks" Mobility  Outcome Measure Help needed turning from your back to your side while in a flat bed without using bedrails?: None Help needed moving from lying on your back to sitting on the side of a flat bed without using bedrails?: None Help needed moving to and from a bed to a chair (including a wheelchair)?: A Little Help needed standing up from a chair using your arms (e.g., wheelchair or bedside chair)?: A Little Help needed to walk in hospital room?: A Little Help needed climbing 3-5 steps with a railing? : A Little 6 Click Score: 20    End of Session Equipment Utilized During Treatment: Gait belt Activity Tolerance: Patient tolerated treatment well Patient left: in bed;with call bell/phone within reach;with bed alarm set Nurse Communication: Mobility status PT Visit Diagnosis: Unsteadiness on feet (R26.81);Other abnormalities of gait and mobility (R26.89);Muscle weakness (generalized) (M62.81);Difficulty in walking, not elsewhere classified (R26.2)    Time: 1610-9604 PT Time Calculation (min) (ACUTE ONLY): 26 min   Charges:    PT Evaluation $PT Eval Low Complexity: 1 Low PT Treatments $Therapeutic Activity: 8-22 mins        Perrin Maltese, PT, DPT Acute Rehabilitation Services Pager [phone] Office [phone]   [name] 09/13/2020, 3:43 PM

## 2020-09-13 NOTE — Progress Notes (Signed)
OT Cancellation Note  Patient Details Name: [name] MRN: [id number] DOB: 1980/12/05   Cancelled Treatment:    Reason Eval/Treat Not Completed: Patient at procedure or test/ unavailable. Pt off unit at HD at this time. Will follow-up for OT eval as time allows.   Layla Maw 09/13/2020, 10:44 AM

## 2020-09-13 NOTE — Progress Notes (Signed)
PROGRESS NOTE    EUTIMIO GHARIBIAN  OEH:[id number] DOB: [birth year] DOA: 09/10/2020 PCP: Kerin Perna, NP   Brief Narrative:  The patient is a 39 year old African-American male with a past medical history significant for but not limited to diabetes mellitus type 1, gastroparesis, hypertension, end-stage renal disease on hemodialysis Monday Wednesday Friday, chronic left foot diabetic wound infection who presents with worsening pain.  He has been seeing Dr. March Rummage with podiatry and patient consulted wound is been worsening and seems larger.  His foot has become erythematous and warm to touch and had some foul-smelling drainage as well.  He missed his follow-up appointment for podiatry and upon arrival to the emergency room and x-ray was done which showed osteomyelitis left foot to start on empiric antibiotics with IV vancomycin and IV Zosyn which has been changed to IV cefepime.  Podiatry has been consulted and additional wound care.  **Interim History Patient was seen by podiatry and podiatry feels that the foot is not salvageable and recommends vascular surgery to evaluate. Vascular surgery evaluated and recommending him left BKA however patient initially refused and now is agreeable. ABIs are being checked and vascular surgery to reevaluate today. Case was discussed with ID who also feels that the best course of action for the patient is a left BKA. Patient is being adamant about having a regular diet while hospitalized and I spoke with nephrology team and they are okay with that as they will be closely monitoring him and adjusting dialysis as necessary. Patient also has a lesion on his Left wrist that will be worked up with an X-Ray and MRI. I spoke to the [place] PA who reccommended imaging first prior to formal Consultation  Assessment & Plan:   Principal Problem:   Acute osteomyelitis involving ankle and foot, left ([name]) Active Problems:   Insulin dependent type 1 diabetes  mellitus ([place])   ESRD on dialysis ([place])   HTN (hypertension)   Sepsis ([place])   Diabetic ulcer of left midfoot associated with diabetes mellitus due to underlying condition, with bone involvement without evidence of necrosis ([place])   Osteomyelitis of left foot ([name])   Malnutrition of moderate degree  Sepsis 2/2 Acute Osteomyelitis involving ankle and foot, left, poA -Mr. Gildersleeve is admitted to med/surg floor. He is started on Vancomycin and zosyn for empiric antibiotic coverage and will change to IV cefepime. Pharmacy consulted to assist with antibiotic dosing.  -Lactic acid level on admission was 2.0 and is now improved to 1.7  -DG Foot showed "Large area of ulceration of the plantar lateral left foot, in close proximity to the tarsometatarsal joint, where previously identified residual portion of the fifth metatarsal base has further eroded, compatible with osteomyelitis (unless there has been further resection since the prior study)." -Patient was getting IV LR at 37 mL's per hour and now stopped -Met Sepsis Criteria with Tachycardia, Leukocytosis, and Source of Infection -Pain control provided and dislaudid ordered for pt for severe pain.  In between we will start him on p.o. Oxycodone -Blood Cx done; Unfortunately only one Cx was obtained on admission and the other one was not done until early this AM; Patient showed NGTD at 3 Days from the 12/14 one and NGTD < 12 hours from the one drawn on 12/17 -Consult Wound Care and out of their scope so recommended Podiatry evalaution.  -Consulted Podiatry Dr. March Rummage studies were reviewed by him and he recommends continuing empiric antibiotics. He recommended the patient be nonweightbearing on the  left lower extremity. Dr. March Rummage believes that the patient will require a position of the foot to resolve this issue and feels that vascular surgery should be consulted for BKA -Vascular Surgery was consulted they felt that the left lower extremity was not salvageable  and that the patient required a left BKA. Patient has not filled wound care and Dr. Trula Slade is checking ABIs. Patient was initially refusing a left BKA but wants input from all the specialist prior to this specifically Dr. March Rummage; Now agreeable to BKA after discussion with Dr. March Rummage; Vascular to determine the timing of Surgery; I spoke with on Call Dr. Carlis Abbott and Vascular to Re-evaluation -I spoke to Dr. Tommy Medal of infectious diseases and patient's chart was reviewed by him and Dr. Tommy Medal feels that the patient is foot is not salvageable and feels that we could attempt to try suppressed with antibiotics but he feels that this would not work out well; he feels that delaying surgery  could potentially have the patient seed to his dialysis catheter -WBC has improved and went from 31,000 -> 29,900 -> 23,100 -> 26,700 -Continue to Monitor and Trend and follow up specialist recommendations -The best felt option for the patient given his extent of infection to try and satisfy source control the infection. Vascular surgery has already discussed with him that the delay in treatment of systemic infection could be potentially fatal; Now he is agreeable and Timing of Surgery is per Vascular Recc's  Left Wrist Lesion/Induration -Obtain X-Ray -WOC Nurse -I spoke with Ortho PA who recommends obtaining MRI w/ Contrast for further evaluation and if shows fluid collection to call back for formal consulation  Sepsis, poA -Pt with sepsis based on elevated WBC, tachycardia and elevated lactic acid level. 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 Consulted Podiatry -Podiatry feels the foot is not salvageable so recommended Vascular Surgery consult for BKA -Timing of BKA per [facility]  Insulin Dependent Type 1 Diabetes Mellitus  -Continue basal insulin. Monitor blood sugar qac,qhs and SSI provided as needed for glycemic control.  -Had HgbA1c of 7.5 a few months ago. -CBG's ranging from 78-243 -C/w Lantus 6 unts sq qHS and Very Sensitive Novolog SSI AC/HS  ESRD on Dialysis MWF Hyperphosphatemia HyperMagnesemia Metabolic Acidosis -Presented with a BUN/creatinine 42/8.65 -> 59/9.90 ->31/6.07 -> 45/6.99 -Patient's Phos Level went from 7.8 -> 7.2 -> 5.4 -Mag Leve was 2.5 and improved to 2.4 -> 2.3 -Had a Metabolic Acidosis on Admission with a CO2 of 19, AG of 22, Chloride Level of 87; Now improved as CO2 is 25, AG is 15, and Chloride Level is 62 -Nephrology was consulted for further evaluation recommendations and maintenance of hemodialysis -Continue with home medications with calcitriol of 2.5 mcg every Monday Wednesday Friday, cinacalcet 60 mg p.o. daily with breakfast, as well as Sevelamer  4000 mg p.o. 3 times daily with meals as well as to 2400 mg p.o. as needed given with snacks with 1500 mL fluid restriction -Nephrology checked Hepatitis B Panel and showed Hep B Surface Ag, Non-Reactive, Heb B S Ab Reactive, and Hep B Core Total Ab Non-Reactive  -He was on a Renal/Carb Modified Diet and is adamantly refusing and Threatening to leave; He states he "always gets a Regular Diet" and he understands that it would not be beneficial to him being a Diabetic and Dialysis patient being on a Regular diet but understands still wants a regular  diet  Hyperbilirubinemia -Likely Reactive -T Bili on Admission was 2.4 and trended up to 2.6 now trended down to 1.1 -Continue to Monitor and Trend -Repeat CMP in the AM   DVT prophylaxis: Heparin 5,000 units sq 8h Code Status: FULL CODE Family Communication: No family present ar besider Disposition Plan: Pending further clinical evaluation and improvement; Needs a BKA   Status is: Inpatient  Remains inpatient appropriate because:Unsafe d/c plan, IV treatments appropriate due to intensity of illness or inability to take PO and Inpatient level of care appropriate due to severity of illness   Dispo: The patient is from: Home              Anticipated d/c is to: TBD              Anticipated d/c date is: > 3 days              Patient currently is not medically stable to d/c.  Consultants:   Podiatry  Nephrology  Vascular Surgery  Discussed case with ID Dr. Tommy Medal    Procedures:  ABI's to be done  Antimicrobials:  Anti-infectives (From admission, onward)   Start     Dose/Rate Route Frequency Ordered Stop   09/13/20 0933  Vancomycin (VANCOCIN) 750-5 MG/150ML-% IVPB       Note to Pharmacy: Anthonette Legato, Melisa   : cabinet override      09/13/20 0933 09/13/20 2144   09/11/20 1200  vancomycin (VANCOCIN) IVPB 750 mg/150 ml premix        750 mg 150 mL/hr over 60 Minutes Intravenous Every M-W-F (Hemodialysis) 09/11/20 0238     09/11/20 1130  ceFEPIme (MAXIPIME) 1 g in sodium chloride 0.9 % 100 mL IVPB        1 g 200 mL/hr over 30 Minutes Intravenous Every 24 hours 09/11/20 0919     09/11/20 0300  piperacillin-tazobactam (ZOSYN) IVPB 2.25 g  Status:  Discontinued        2.25 g 100 mL/hr over 30 Minutes Intravenous Every 8 hours 09/11/20 0238 09/11/20 0858   09/11/20 0300  vancomycin (VANCOREADY) IVPB 1250 mg/250 mL        1,250 mg 166.7 mL/hr over 90 Minutes Intravenous  Once 09/11/20 0238 09/11/20 1007        Subjective: Seen and examined at bedside he was in dialysis  complaining of pain still.  No nausea or vomiting.  Complained that he did as a fluid restriction on his diet.  No shortness of breath.  Also complaining about a left wrist lesion that he states has been going on for a while.  No lightheadedness or dizziness.  No other concerns or complaints at this time.  Objective: Vitals:   09/13/20 1136 09/13/20 1140 09/13/20 1148 09/13/20 1246  BP: 126/62   98/69  Pulse: (!) 107  62 100  Resp:      Temp:    Marland Kitchen)  97.5 F (36.4 C)  TempSrc:    Oral  SpO2:   99% 90%  Weight:  68 kg    Height:        Intake/Output Summary (Last 24 hours) at 09/13/2020 1333 Last data filed at 09/13/2020 1136 Gross per 24 hour  Intake --  Output 1788 ml  Net -1788 ml   Filed Weights   09/11/20 1435 09/13/20 0839 09/13/20 1140  Weight: 69.4 kg 69.4 kg 68 kg   Examination: Physical Exam:  Constitutional: WN/WD mildly ill appearing older appearing than his stated age African-American male was lying in bed in the dialysis unit and appears calm Eyes: Lids and conjunctivae normal, sclerae anicteric  ENMT: External Ears, Nose appear normal. Grossly normal hearing.  Neck: Appears normal, supple, no cervical masses, normal ROM, no appreciable thyromegaly; no JVD Respiratory: Diminished to auscultation bilaterally, no wheezing, rales, rhonchi or crackles. Normal respiratory effort and patient is not tachypenic. No accessory muscle use.  Unlabored breathing Cardiovascular: RRR, no murmurs / rubs / gallops. S1 and S2 auscultated.  Has some leg swelling and left leg is warm and erythematous Abdomen: Soft, non-tender, non-distended. Bowel sounds positive.  GU: Deferred. Musculoskeletal: No clubbing / cyanosis of digits/nails.  Some prior toe amputations noted and left foot ulceration is covered today.  Left leg is warm and erythematous slightly. Skin: Has a left plantar aspect diabetic foot ulcer as well as a left wrist lesion that is indurated and discolored. Warm and dry.   Neurologic: CN 2-12 grossly intact with no focal deficits. Romberg sign and cerebellar reflexes not assessed.  Psychiatric: Normal judgment and insight. Alert and oriented x 3. Normal mood and not as axnious and appropriate affect.   Data Reviewed: I have personally reviewed following labs and imaging studies  CBC: Recent Labs  Lab 09/10/20 1655 09/11/20 0517 09/12/20 0330 09/13/20 0123  WBC 31.0* 29.9* 23.1* 26.7*  NEUTROABS 28.8*  --  20.7* 22.9*  HGB 10.8* 10.2* 9.9* 9.7*  HCT 36.1* 33.4* 32.2* 29.0*  MCV 93.8 92.3 92.0 88.1  PLT 203 191 204 007   Basic Metabolic Panel: Recent Labs  Lab 09/10/20 1655 09/11/20 0517 09/11/20 1453 09/12/20 0330 09/13/20 0123  NA 128*  --  129* 133* 127*  K 5.1  --  5.4* 3.6 4.1  CL 87*  --  89* 89* 87*  CO2 19*  --  16* 22 25  GLUCOSE 328*  --  380* 189* 177*  BUN 42*  --  59* 31* 45*  CREATININE 8.65* 9.44* 9.90* 6.07* 6.99*  CALCIUM 8.9  --  8.5* 8.5* 9.1  MG  --   --  2.5* 2.4 2.3  PHOS  --   --  7.8* 7.2* 5.4*   GFR: Estimated Creatinine Clearance: 13.1 mL/min (A) (by C-G formula based on SCr of 6.99 mg/dL (H)). Liver Function Tests: Recent Labs  Lab 09/10/20 1655 09/11/20 1453 09/12/20 0330 09/13/20 0123  AST 14* _0 ALT _1 ALKPHOS 180* 171* 179* 164*  BILITOT 2.4* 2.6* 1.6* 1.1  PROT 7.5 6.5 6.8 6.8  ALBUMIN 2.4* 2.1* 2.0* 2.0*   No results for input(s): LIPASE, AMYLASE in the last 168 hours. No results for input(s): AMMONIA in the last 168 hours. Coagulation Profile: No results for input(s): INR, PROTIME in the last 168 hours. Cardiac Enzymes: No results for input(s): CKTOTAL, CKMB, CKMBINDEX, TROPONINI in the last 168 hours. BNP (last 3 results) No results for input(s): PROBNP in  the last 8760 hours. HbA1C: No results for input(s): HGBA1C in the last 72 hours. CBG: Recent Labs  Lab 09/12/20 1145 09/12/20 1725 09/12/20 2107 09/13/20 0651 09/13/20 1243  GLUCAP 243* 78 215* 156* 182*    Lipid Profile: No results for input(s): CHOL, HDL, LDLCALC, TRIG, CHOLHDL, LDLDIRECT in the last 72 hours. Thyroid Function Tests: No results for input(s): TSH, T4TOTAL, FREET4, T3FREE, THYROIDAB in the last 72 hours. Anemia Panel: Recent Labs    09/13/20 0123 09/13/20 0124  VITAMINB12 1,152*  --   FOLATE  --  7.6  FERRITIN 3,890*  --   TIBC 125*  --   IRON 42*  --   RETICCTPCT  --  0.8   Sepsis Labs: Recent Labs  Lab 09/10/20 1656 09/11/20 0303  LATICACIDVEN 2.0* 1.7    Recent Results (from the past 240 hour(s))  Blood culture (routine x 2)     Status: None (Preliminary result)   Collection Time: 09/10/20  4:56 PM   Specimen: BLOOD  Result Value Ref Range Status   Specimen Description BLOOD SITE NOT SPECIFIED  Final   Special Requests   Final    BOTTLES DRAWN AEROBIC AND ANAEROBIC Blood Culture adequate volume   Culture   Final    NO GROWTH 3 DAYS Performed at [facility], [place] [address], [place], [place] [address]    Report Status PENDING  Incomplete  Resp Panel by RT-PCR (Flu A&B, Covid) Nasopharyngeal Swab     Status: None   Collection Time: 09/11/20  4:41 AM   Specimen: Nasopharyngeal Swab; Nasopharyngeal(NP) swabs in vial transport medium  Result Value Ref Range Status   SARS Coronavirus 2 by RT PCR NEGATIVE NEGATIVE Final    Comment: (NOTE) SARS-CoV-2 target nucleic acids are NOT DETECTED.  The SARS-CoV-2 RNA is generally detectable in upper respiratory specimens during the acute phase of infection. The lowest concentration of SARS-CoV-2 viral copies this assay can detect is 138 copies/mL. A negative result does not preclude SARS-Cov-2 infection and should not be used as the sole basis for treatment or other patient management decisions. A negative result may occur with  improper specimen collection/handling, submission of specimen other than nasopharyngeal swab, presence of viral mutation(s) within the areas targeted by this assay, and  inadequate number of viral copies(<138 copies/mL). A negative result must be combined with clinical observations, patient history, and epidemiological information. The expected result is Negative.  Fact Sheet for Patients:  EntrepreneurPulse.com.au  Fact Sheet for Healthcare Providers:  IncredibleEmployment.be  This test is no t yet approved or cleared by the [place] FDA and  has been authorized for detection and/or diagnosis of SARS-CoV-2 by FDA under an Emergency Use Authorization (EUA). This EUA will remain  in effect (meaning this test can be used) for the duration of the COVID-19 declaration under Section 564(b)(1) of the Act, 21 U.S.C.section 360bbb-3(b)(1), unless the authorization is terminated  or revoked sooner.       Influenza A by PCR NEGATIVE NEGATIVE Final   Influenza B by PCR NEGATIVE NEGATIVE Final    Comment: (NOTE) The Xpert Xpress SARS-CoV-2/FLU/RSV plus assay is intended as an aid in the diagnosis of influenza from Nasopharyngeal swab specimens and should not be used as a sole basis for treatment. Nasal washings and aspirates are unacceptable for Xpert Xpress SARS-CoV-2/FLU/RSV testing.  Fact Sheet for Patients: EntrepreneurPulse.com.au  Fact Sheet for Healthcare Providers: IncredibleEmployment.be  This test is not yet approved or cleared by the [place] and  has been authorized for detection and/or diagnosis of SARS-CoV-2 by FDA under an Emergency Use Authorization (EUA). This EUA will remain in effect (meaning this test can be used) for the duration of the COVID-19 declaration under Section 564(b)(1) of the Act, 21 U.S.C. section 360bbb-3(b)(1), unless the authorization is terminated or revoked.  Performed at [facility], [place] [address], [place], [place] [address]   Blood culture (routine x 2)     Status: None (Preliminary result)   Collection Time: 09/13/20   1:23 AM   Specimen: BLOOD  Result Value Ref Range Status   Specimen Description BLOOD RIGHT ANTECUBITAL  Final   Special Requests   Final    BOTTLES DRAWN AEROBIC AND ANAEROBIC Blood Culture adequate volume   Culture   Final    NO GROWTH < 12 HOURS Performed at [facility], [place] [address] [place], [place] [address]    Report Status PENDING  Incomplete     RN Pressure Injury Documentation:     Estimated body mass index is 23.48 kg/m as calculated from the following:   Height as of this encounter: 5' 7" (1.702 m).   Weight as of this encounter: 68 kg.  Malnutrition Type: Nutrition Problem: Moderate Malnutrition Etiology: chronic illness (ESRD on HD) Malnutrition Characteristics: Signs/Symptoms: mild fat depletion,moderate fat depletion,moderate muscle depletion,severe muscle depletion Nutrition Interventions:  Interventions: Prostat,MVI  Radiology Studies: No results found. Scheduled Meds: . (feeding supplement) PROSource Plus  30 mL Oral TID BM  . calcitRIOL      . calcitRIOL  2.5 mcg Oral Q M,W,F-HD  . Chlorhexidine Gluconate Cloth  6 each Topical Q0600  . cinacalcet  60 mg Oral Q breakfast  . darbepoetin (ARANESP) injection - DIALYSIS  25 mcg Intravenous Q Fri-HD  . heparin  5,000 Units Subcutaneous Q8H  . HYDROmorphone      . insulin aspart  0-5 Units Subcutaneous QHS  . insulin aspart  0-6 Units Subcutaneous TID WC  . insulin glargine  6 Units Subcutaneous QHS  . multivitamin  1 tablet Oral Daily  . sevelamer carbonate  4,000 mg Oral TID WC  . sodium hypochlorite  1 application Irrigation Daily   Continuous Infusions: . ceFEPime (MAXIPIME) IV 1 g (09/12/20 1154)  . Vancomycin    . vancomycin Stopped (09/13/20 1231)    LOS: 2 days   Kerney Elbe, DO [facility] PAGER is on [place]  If 7PM-7AM, please contact night-coverage [email or web address]

## 2020-09-13 NOTE — Plan of Care (Signed)
  Problem: Education: Goal: Knowledge of General Education information will improve Description: Including pain rating scale, medication(s)/side effects and non-pharmacologic comfort measures Outcome: Not Progressing   Problem: Health Behavior/Discharge Planning: Goal: Ability to manage health-related needs will improve Outcome: Not Progressing   Problem: Clinical Measurements: Goal: Ability to maintain clinical measurements within normal limits will improve Outcome: Not Progressing   Problem: Nutrition: Goal: Adequate nutrition will be maintained Outcome: Not Progressing   Problem: Coping: Goal: Level of anxiety will decrease Outcome: Not Progressing

## 2020-09-13 NOTE — Progress Notes (Signed)
Patient was seen by Dr. Trula Slade earlier this week and offered left BKA.  Apparently was refusing at that time.  He has a large ulcer on the plantar surface of his foot as pictured below.  Discussed with him I do not think a TMA would be an option and would likely require BKA unless he wants to continue wound care.  He would like another opinion and it may be reasonable to ask Dr. Sharol Given with orthopedics to see him.  Assuming he agrees he would be amendable to proceed with BKA next week.    Marty Heck, MD [facility] Office: [place]

## 2020-09-13 NOTE — Progress Notes (Signed)
[facility] Progress Note   Subjective:   Patient seen and examined at bedside during dialysis.  Tolerating treatment well.  Reports being scared/nervous about upcoming amputation.  Continues to have nausea, which he says is chronic and d/t gastroparesis.  Denies vomiting/diarrhea, CP, and SOB.   Objective Vitals:   09/13/20 1000 09/13/20 1017 09/13/20 1030 09/13/20 1040  BP: (!) 100/40 (!) 137/91 (!) 167/122 (!) 94/48  Pulse: 100 74 76 (!) 108  Resp: 18 18  18   Temp:      TempSrc:      SpO2:      Weight:      Height:       Physical Exam General:WDWN male in NAD Heart:RRR, no mrg Lungs:CTAB, nml WOB Abdomen:soft, NTND Extremities:no LE edema,  Dialysis Access: R IJ TDC in use   Filed Weights   09/10/20 1602 09/11/20 1435 09/13/20 0839  Weight: 69.4 kg 69.4 kg 69.4 kg    Intake/Output Summary (Last 24 hours) at 09/13/2020 1044 Last data filed at 09/13/2020 0300 Gross per 24 hour  Intake --  Output 300 ml  Net -300 ml    Additional Objective Labs: Basic Metabolic Panel: Recent Labs  Lab 09/11/20 1453 09/12/20 0330 09/13/20 0123  NA 129* 133* 127*  K 5.4* 3.6 4.1  CL 89* 89* 87*  CO2 16* 22 25  GLUCOSE 380* 189* 177*  BUN 59* 31* 45*  CREATININE 9.90* 6.07* 6.99*  CALCIUM 8.5* 8.5* 9.1  PHOS 7.8* 7.2* 5.4*   Liver Function Tests: Recent Labs  Lab 09/11/20 1453 09/12/20 0330 09/13/20 0123  AST 15 17 17   ALT 12 12 13   ALKPHOS 171* 179* 164*  BILITOT 2.6* 1.6* 1.1  PROT 6.5 6.8 6.8  ALBUMIN 2.1* 2.0* 2.0*   CBC: Recent Labs  Lab 09/10/20 1655 09/11/20 0517 09/12/20 0330 09/13/20 0123  WBC 31.0* 29.9* 23.1* 26.7*  NEUTROABS 28.8*  --  20.7* 22.9*  HGB 10.8* 10.2* 9.9* 9.7*  HCT 36.1* 33.4* 32.2* 29.0*  MCV 93.8 92.3 92.0 88.1  PLT 203 191 204 201   Blood Culture    Component Value Date/Time   SDES BLOOD RIGHT ANTECUBITAL 09/13/2020 0123   SPECREQUEST  09/13/2020 0123    BOTTLES DRAWN AEROBIC AND ANAEROBIC Blood Culture  adequate volume   CULT  09/13/2020 0123    NO GROWTH < 12 HOURS Performed at [facility] [address] [place], [place] [address]    REPTSTATUS PENDING 09/13/2020 0123   CBG: Recent Labs  Lab 09/12/20 0649 09/12/20 1145 09/12/20 1725 09/12/20 2107 09/13/20 0651  GLUCAP 137* 243* 78 215* 156*   Iron Studies:  Recent Labs    09/13/20 0123  IRON 42*  TIBC 125*  FERRITIN 3,890*   Lab Results  Component Value Date   INR 1.2 07/22/2020   INR 1.0 05/30/2020   INR 1.2 04/24/2019   Studies/Results: No results found.  Medications: . ceFEPime (MAXIPIME) IV 1 g (09/12/20 1154)  . Vancomycin    . vancomycin Stopped (09/11/20 1745)   . (feeding supplement) PROSource Plus  30 mL Oral TID BM  . calcitRIOL      . calcitRIOL  2.5 mcg Oral Q M,W,F-HD  . Chlorhexidine Gluconate Cloth  6 each Topical Q0600  . cinacalcet  60 mg Oral Q breakfast  . Darbepoetin Alfa      . darbepoetin (ARANESP) injection - DIALYSIS  25 mcg Intravenous Q Fri-HD  . heparin  5,000 Units Subcutaneous Q8H  .  HYDROmorphone      . insulin aspart  0-5 Units Subcutaneous QHS  . insulin aspart  0-6 Units Subcutaneous TID WC  . insulin glargine  6 Units Subcutaneous QHS  . multivitamin  1 tablet Oral Daily  . sevelamer carbonate  4,000 mg Oral TID WC  . sodium hypochlorite  1 application Irrigation Daily    Dialysis Orders: [facility] on MWFon 4 hours 15 minutes. VVO16 kgHD Bath2K, 2 calcium bath, Heparin5000 units initial then 5-mid  Calcitriol 3.0 MCG p.o. q. Dialysis no ESA  Assessment/Plan: 1.   ESRD -HD MWF. HD today per regular schedule. OP [facility] had scheduled for R BC AVF  09/24/2020. 2. Left foot wound with osteomyelitis - on IV vancomycin and Zosyn per admit and Dr. March Rummage podiatry.  Has now agreed to BKA.  Plan per [facility]. 3. Hypertension/volume - BP variable.  Appears euvolemic on exam. UF as tolerated.  4. 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 Aranesp 62mcg qHD ordered with HD today. Ferritin  3890, no FE.  5. Metabolic bone disease - Ca and phos at goal. Continue p.o. calcitriol on HD, Renvel and sensipar 6. Nutrition -carb modified renal protein supplement Albumin 2.0 add protein supplement 7. Diabetes mellitus type 1-per admit    Jen Mow, PA-C [facility] 09/13/2020,10:44 AM  LOS: 2 days

## 2020-09-14 ENCOUNTER — Encounter (HOSPITAL_COMMUNITY)
Admission: EM | Discharge status: Against Medical Advice | Payer: Self-pay | Source: Home / Self Care | Attending: Internal Medicine

## 2020-09-14 ENCOUNTER — Inpatient Hospital Stay (HOSPITAL_COMMUNITY): Payer: Medicare HMO

## 2020-09-14 HISTORY — PX: I & D EXTREMITY: SHX5045

## 2020-09-14 LAB — POCT I-STAT, CHEM 8
BUN: 30 mg/dL — ABNORMAL HIGH (ref 6–20)
Calcium, Ion: 0.98 mmol/L — ABNORMAL LOW (ref 1.15–1.40)
Chloride: 102 mmol/L (ref 98–111)
Creatinine, Ser: 3.9 mg/dL — ABNORMAL HIGH (ref 0.61–1.24)
Glucose, Bld: 303 mg/dL — ABNORMAL HIGH (ref 70–99)
HCT: 25 % — ABNORMAL LOW (ref 39.0–52.0)
Hemoglobin: 8.5 g/dL — ABNORMAL LOW (ref 13.0–17.0)
Potassium: 2.7 mmol/L — CL (ref 3.5–5.1)
Sodium: 133 mmol/L — ABNORMAL LOW (ref 135–145)
TCO2: 17 mmol/L — ABNORMAL LOW (ref 22–32)

## 2020-09-14 LAB — CBC WITH DIFFERENTIAL/PLATELET
Abs Immature Granulocytes: 1.83 10*3/uL — ABNORMAL HIGH (ref 0.00–0.07)
Basophils Absolute: 0.1 10*3/uL (ref 0.0–0.1)
Basophils Relative: 1 %
Eosinophils Absolute: 0.1 10*3/uL (ref 0.0–0.5)
Eosinophils Relative: 1 %
HCT: 29.6 % — ABNORMAL LOW (ref 39.0–52.0)
Hemoglobin: 9.5 g/dL — ABNORMAL LOW (ref 13.0–17.0)
Immature Granulocytes: 7 %
Lymphocytes Relative: 7 %
Lymphs Abs: 1.7 10*3/uL (ref 0.7–4.0)
MCH: 28.7 pg (ref 26.0–34.0)
MCHC: 32.1 g/dL (ref 30.0–36.0)
MCV: 89.4 fL (ref 80.0–100.0)
Monocytes Absolute: 2.6 10*3/uL — ABNORMAL HIGH (ref 0.1–1.0)
Monocytes Relative: 10 %
Neutro Abs: 20 10*3/uL — ABNORMAL HIGH (ref 1.7–7.7)
Neutrophils Relative %: 74 %
Platelets: 191 10*3/uL (ref 150–400)
RBC: 3.31 MIL/uL — ABNORMAL LOW (ref 4.22–5.81)
RDW: 14.4 % (ref 11.5–15.5)
WBC Morphology: INCREASED
WBC: 26.3 10*3/uL — ABNORMAL HIGH (ref 4.0–10.5)
nRBC: 0 % (ref 0.0–0.2)

## 2020-09-14 LAB — COMPREHENSIVE METABOLIC PANEL
ALT: 13 U/L (ref 0–44)
AST: 15 U/L (ref 15–41)
Albumin: 1.7 g/dL — ABNORMAL LOW (ref 3.5–5.0)
Alkaline Phosphatase: 153 U/L — ABNORMAL HIGH (ref 38–126)
Anion gap: 12 (ref 5–15)
BUN: 32 mg/dL — ABNORMAL HIGH (ref 6–20)
CO2: 28 mmol/L (ref 22–32)
Calcium: 9.3 mg/dL (ref 8.9–10.3)
Chloride: 90 mmol/L — ABNORMAL LOW (ref 98–111)
Creatinine, Ser: 5.04 mg/dL — ABNORMAL HIGH (ref 0.61–1.24)
GFR, Estimated: 14 mL/min — ABNORMAL LOW (ref >60)
Glucose, Bld: 241 mg/dL — ABNORMAL HIGH (ref 70–99)
Potassium: 4.1 mmol/L (ref 3.5–5.1)
Sodium: 130 mmol/L — ABNORMAL LOW (ref 135–145)
Total Bilirubin: 1.1 mg/dL (ref 0.3–1.2)
Total Protein: 6.4 g/dL — ABNORMAL LOW (ref 6.5–8.1)

## 2020-09-14 LAB — PHOSPHORUS: Phosphorus: 4.7 mg/dL — ABNORMAL HIGH (ref 2.5–4.6)

## 2020-09-14 LAB — GLUCOSE, CAPILLARY
Glucose-Capillary: 202 mg/dL — ABNORMAL HIGH (ref 70–99)
Glucose-Capillary: 201 mg/dL — ABNORMAL HIGH (ref 70–99)
Glucose-Capillary: 364 mg/dL — ABNORMAL HIGH (ref 70–99)

## 2020-09-14 LAB — MAGNESIUM: Magnesium: 2.3 mg/dL (ref 1.7–2.4)

## 2020-09-14 SURGERY — IRRIGATION AND DEBRIDEMENT EXTREMITY
Anesthesia: General | Laterality: Left

## 2020-09-14 MED ORDER — MIDAZOLAM HCL 2 MG/2ML IJ SOLN
INTRAMUSCULAR | Status: AC
  Filled 2020-09-14: qty 2

## 2020-09-14 MED ORDER — FENTANYL CITRATE (PF) 100 MCG/2ML IJ SOLN
INTRAMUSCULAR | Status: AC
  Filled 2020-09-14: qty 2

## 2020-09-14 MED ORDER — PROPOFOL 10 MG/ML IV BOLUS
INTRAVENOUS | Status: AC
  Filled 2020-09-14: qty 20

## 2020-09-14 MED ORDER — BUPIVACAINE HCL (PF) 0.25 % IJ SOLN
INTRAMUSCULAR | Status: AC
  Filled 2020-09-14: qty 10

## 2020-09-14 SURGICAL SUPPLY — 53 items
ADAPTER CATH SYR TO TUBING 38M (ADAPTER) ×2 IMPLANT
ADPR CATH LL SYR 3/32 TPR (ADAPTER) ×1
BINDER ABDOMINAL 12 SM 30-45 (SOFTGOODS) ×2 IMPLANT
BNDG CMPR 9X4 STRL LF SNTH (GAUZE/BANDAGES/DRESSINGS) ×1
BNDG COHESIVE 2X5 TAN STRL LF (GAUZE/BANDAGES/DRESSINGS) IMPLANT
BNDG ELASTIC 3X5.8 VLCR STR LF (GAUZE/BANDAGES/DRESSINGS) ×2 IMPLANT
BNDG ELASTIC 4X5.8 VLCR STR LF (GAUZE/BANDAGES/DRESSINGS) ×2 IMPLANT
BNDG ESMARK 4X9 LF (GAUZE/BANDAGES/DRESSINGS) ×2 IMPLANT
BNDG GAUZE ELAST 4 BULKY (GAUZE/BANDAGES/DRESSINGS) ×2 IMPLANT
CORD BIPOLAR FORCEPS 12FT (ELECTRODE) ×2 IMPLANT
COVER SURGICAL LIGHT HANDLE (MISCELLANEOUS) ×2 IMPLANT
COVER WAND RF STERILE (DRAPES) ×2 IMPLANT
CUFF TOURN SGL QUICK 18X4 (TOURNIQUET CUFF) IMPLANT
CUFF TOURN SGL QUICK 24 (TOURNIQUET CUFF)
CUFF TRNQT CYL 24X4X16.5-23 (TOURNIQUET CUFF) IMPLANT
DECANTER SPIKE VIAL GLASS SM (MISCELLANEOUS) ×2 IMPLANT
DRSG PAD ABDOMINAL 8X10 ST (GAUZE/BANDAGES/DRESSINGS) ×4 IMPLANT
GAUZE SPONGE 4X4 12PLY STRL (GAUZE/BANDAGES/DRESSINGS) ×2 IMPLANT
GAUZE SPONGE 4X4 12PLY STRL LF (GAUZE/BANDAGES/DRESSINGS) ×2 IMPLANT
GLOVE BIO SURGEON STRL SZ7.5 (GLOVE) ×2 IMPLANT
GLOVE BIOGEL PI IND STRL 8 (GLOVE) ×1 IMPLANT
GLOVE BIOGEL PI IND STRL 8.5 (GLOVE) IMPLANT
GLOVE BIOGEL PI INDICATOR 8 (GLOVE) ×1
GLOVE BIOGEL PI INDICATOR 8.5 (GLOVE)
GLOVE BIOGEL PI ORTHO PRO SZ8 (GLOVE)
GLOVE PI ORTHO PRO STRL SZ8 (GLOVE) IMPLANT
GLOVE SURG ORTHO 8.0 STRL STRW (GLOVE) IMPLANT
GOWN STRL REUS W/ TWL LRG LVL3 (GOWN DISPOSABLE) ×1 IMPLANT
GOWN STRL REUS W/ TWL XL LVL3 (GOWN DISPOSABLE) ×1 IMPLANT
GOWN STRL REUS W/TWL LRG LVL3 (GOWN DISPOSABLE) ×2
GOWN STRL REUS W/TWL XL LVL3 (GOWN DISPOSABLE) ×2
KIT BASIN OR (CUSTOM PROCEDURE TRAY) ×2 IMPLANT
KIT TURNOVER KIT B (KITS) ×2 IMPLANT
LOOP VESSEL MAXI BLUE (MISCELLANEOUS) IMPLANT
MANIFOLD NEPTUNE II (INSTRUMENTS) ×2 IMPLANT
NEEDLE HYPO 25X1 1.5 SAFETY (NEEDLE) ×2 IMPLANT
NS IRRIG 1000ML POUR BTL (IV SOLUTION) ×2 IMPLANT
PACK ORTHO EXTREMITY (CUSTOM PROCEDURE TRAY) ×2 IMPLANT
PAD ARMBOARD 7.5X6 YLW CONV (MISCELLANEOUS) ×4 IMPLANT
SET CYSTO W/LG BORE CLAMP LF (SET/KITS/TRAYS/PACK) IMPLANT
SOL PREP POV-IOD 4OZ 10% (MISCELLANEOUS) ×4 IMPLANT
SPONGE LAP 4X18 RFD (DISPOSABLE) ×2 IMPLANT
SUT ETHILON 4 0 P 3 18 (SUTURE) IMPLANT
SUT ETHILON 4 0 PS 2 18 (SUTURE) IMPLANT
SUT MON AB 5-0 P3 18 (SUTURE) IMPLANT
SWAB COLLECTION DEVICE MRSA (MISCELLANEOUS) IMPLANT
SWAB CULTURE ESWAB REG 1ML (MISCELLANEOUS) ×2 IMPLANT
SYR CONTROL 10ML LL (SYRINGE) IMPLANT
TOWEL GREEN STERILE (TOWEL DISPOSABLE) ×2 IMPLANT
TUBE CONNECTING 12X1/4 (SUCTIONS) ×2 IMPLANT
TUBE FEEDING ENTERAL 5FR 16IN (TUBING) IMPLANT
UNDERPAD 30X36 HEAVY ABSORB (UNDERPADS AND DIAPERS) ×2 IMPLANT
YANKAUER SUCT BULB TIP NO VENT (SUCTIONS) ×2 IMPLANT

## 2020-09-14 NOTE — Progress Notes (Signed)
PROGRESS NOTE    [name]  ZOX:[id number] DOB: Apr 29, 1981 DOA: 09/10/2020 PCP: [name]   Brief Narrative:  The patient is a 39 year old African-American male with a past medical history significant for but not limited to diabetes mellitus type 1, gastroparesis, hypertension, end-stage renal disease on hemodialysis Monday Wednesday Friday, chronic left foot diabetic wound infection who presents with worsening pain.  He has been seeing [name] with podiatry and patient consulted wound is been worsening and seems larger.  His foot has become erythematous and warm to touch and had some foul-smelling drainage as well.  He missed his follow-up appointment for podiatry and upon arrival to the emergency room and x-ray was done which showed osteomyelitis left foot to start on empiric antibiotics with IV vancomycin and IV Zosyn which has been changed to IV cefepime.  Podiatry has been consulted and additional wound care.  **Interim History Patient was seen by podiatry and podiatry feels that the foot is not salvageable and recommends vascular surgery to evaluate. Vascular surgery evaluated and recommending him left BKA however patient initially refused and now is agreeable. ABIs are being checked and vascular surgery to reevaluate today. Case was discussed with ID who also feels that the best course of action for the patient is a left BKA. Patient is being adamant about having a regular diet while hospitalized and I spoke with nephrology team and they are okay with that as they will be closely monitoring him and adjusting dialysis as necessary. Patient also has a lesion on his Left wrist that will be worked up with an X-Ray and MRI. I spoke to the [place] PA who reccommended imaging first prior to formal Consultation  **09/14/20 Patient refused MRI last night and is agreeable to do today.  He wanted another consultation and opinion about having a BKA so orthopedic surgery Dr.  Sharol [name] was consulted.  Initially he was not agreeable and then he was agreeable after [name] discussion with [name] [name] feels that the patient still needs a BKA.  MRI done and showed a volar aspect of the wrist with underlying ill-defined fluid compatible with phlegmon or early abscess.  Will discuss with hand surgery about further care about wrist lesion.   Assessment & Plan:   Principal Problem:   Acute osteomyelitis involving ankle and foot, left ([name]) Active Problems:   Insulin dependent type 1 diabetes mellitus ([name])   ESRD on dialysis ([name])   HTN (hypertension)   Sepsis ([place])   Diabetic ulcer of left midfoot associated with diabetes mellitus due to underlying condition, with bone involvement without evidence of necrosis ([employer])   Osteomyelitis of left foot ([name])   Malnutrition of moderate degree  Sepsis 2/2 Acute Osteomyelitis involving ankle and foot, left, poA -[name] is admitted to med/surg floor. He is started on Vancomycin and zosyn for empiric antibiotic coverage and will change to IV cefepime. Pharmacy consulted to assist with antibiotic dosing.  -Lactic acid level on admission was 2.0 and is now improved to 1.7  -DG Foot showed "Large area of ulceration of the plantar lateral left foot, in close proximity to the tarsometatarsal joint, where previously identified residual portion of the fifth metatarsal base has further eroded, compatible with osteomyelitis (unless there has been further resection since the prior study)." -Patient was getting IV LR at 61 mL's per hour and now stopped -Met Sepsis Criteria with Tachycardia, Leukocytosis, and Source of Infection -Pain control provided and dislaudid ordered for  pt for severe pain.  In between we will start him on p.o. Oxycodone -Blood Cx done; Unfortunately only one Cx was obtained on admission and the other one was not done until early this AM; Patient showed NGTD at 3 Days from the 12/14 one and NGTD < 12 hours from the one  drawn on 12/17 -Consult Wound Care and out of their scope so recommended Podiatry evalaution.  -Consulted Podiatry [name] studies were reviewed by him and he recommends continuing empiric antibiotics. He recommended the patient be nonweightbearing on the left lower extremity. [name] believes that the patient will require a position of the foot to resolve this issue and feels that vascular surgery should be consulted for BKA -Vascular Surgery was consulted they felt that the left lower extremity was not salvageable and that the patient required a left BKA. Patient has not filled wound care and [name] is checking ABIs. Patient was initially refusing a left BKA but wants input from all the specialist prior to this specifically [name]; Now agreeable to BKA after discussion with [name]; Vascular to determine the timing of Surgery; I spoke with on Call [name] and Vascular to Re-evaluation -I spoke to [name] of infectious diseases and patient's chart was reviewed by him and [name] feels that the patient is foot is not salvageable and feels that we could attempt to try suppressed with antibiotics but he feels that this would not work out well; he feels that delaying surgery  could potentially have the patient seed to his dialysis catheter -WBC has improved and went from 31,000 -> 29,900 -> 23,100 -> 26,700 and today is now 40,300 -Continue to Monitor and Trend and follow up specialist recommendations -The best felt option for the patient [name] his extent of infection to try and satisfy source control the infection. Vascular surgery has already discussed with him that the delay in treatment of systemic infection could be potentially fatal; Now he is agreeable and Timing of Surgery is per Vascular Recc's; patient wanted a second opinion so [name] was consulted and [name] agrees that the patient needs a BKA; surgery is tentatively posted for Monday assuming that the patient would like  to proceed  Left Wrist Lesion/Induration -Obtain X-Ray and showed "There is no evidence of fracture or dislocation. There is no evidence of arthropathy or other focal bone abnormality. Vascular calcifications are noted. No definite lytic destruction is seen to suggest osteomyelitis." -WOC Nurse -I spoke with Ortho PA who recommends obtaining MRI w/ Contrast for further evaluation and if shows fluid collection to call back for formal consultation -MRI done and showed "Skin ulceration on the volar aspect of the wrist with underlying somewhat ill-defined fluid compatible with phlegmon or early abscess. Negative for osteomyelitis or septic joint.   Mild edema in the musculature of the hypothenar eminence could be due to myositis or early atrophy." -Will discuss with Hand Surgeon on Calland I spoke with Dr. Fredna Dow who will evaluate and recommended the patient be NPO for possible Debridement  Sepsis, poA -Pt with sepsis based on elevated WBC, tachycardia and elevated lactic acid level. -Started on empiric antibiotics as above and will continue as above    Hyponatremia -Mild at 128 and fluctuating as improved to 133 -> 127 -> 130 -Continue to monitor and trend -Repeat CMP in the AM   Anemia of Chronic Kidney Disease  -Patient's Hgb/Hct went from 10.8/36.1 -> 10.2/33.4 -> 9.9/32.2 -> 9.7/29.0 -> 9.5/29.6 -  Check Anemia Panel and showed iron level of 42, U IBC of 83, TIBC 125, saturation ratio 34%, ferritin level of 3890, folate level 7.6, and vitamin B12 1152 -Continue to Monitor for S/Sx of Bleeding; Currently no overt bleeding noted -Repeat CBC in the AM   Diabetic ulcer of left midfoot associated with diabetes mellitus due to underlying condition, with bone involvement without evidence of necrosis  -Consult wound care. Consulted Podiatry -Podiatry feels the foot is not salvageable so recommended Vascular Surgery consult for BKA -Timing of BKA per [facility]  Insulin Dependent Type 1  Diabetes Mellitus  -Continue basal insulin. Monitor blood sugar qac,qhs and SSI provided as needed for glycemic control.  -Had HgbA1c of 7.5 a few months ago. -CBG's ranging from 156-202 -C/w Lantus 6 unts sq qHS and Very Sensitive Novolog SSI AC/HS  ESRD on Dialysis MWF Hyperphosphatemia HyperMagnesemia Metabolic Acidosis -Presented with a BUN/creatinine 42/8.65 -> 59/9.90 ->31/6.07 -> 45/6.99 -> 32/5.05 -Patient's Phos Level went from 7.8 -> 7.2 -> 5.4 -> 4.7 -Mag Leve was 2.5 and improved to 2.4 -> 2.3 -> 2.3 -Had a Metabolic Acidosis on Admission with a CO2 of 19, AG of 22, Chloride Level of 87; Now improved as CO2 is 28, AG is 12, and Chloride Level is 13 -Nephrology was consulted for further evaluation recommendations and maintenance of hemodialysis -Continue with home medications with calcitriol of 2.5 mcg every Monday Wednesday Friday, cinacalcet 60 mg p.o. daily with breakfast, as well as Sevelamer 4000 mg p.o. 3 times daily with meals as well as to 2400 mg p.o. as needed [name] with snacks with 1500 mL fluid restriction -Nephrology checked Hepatitis B Panel and showed Hep B Surface Ag, Non-Reactive, Heb B S Ab Reactive, and Hep B Core Total Ab Non-Reactive  -He was on a Renal/Carb Modified Diet and is adamantly refusing and Threatening to leave; He states he "always gets a Regular Diet" and he understands that it would not be beneficial to him being a Diabetic and Dialysis patient being on a Regular diet but understands still wants a regular diet  Hyperbilirubinemia -Likely Reactive -T Bili on Admission was 2.4 and trended up to 2.6 now trended down to 1.1 -Continue to Monitor and Trend -Repeat CMP in the AM   DVT prophylaxis: Heparin 5,000 units sq 8h Code Status: FULL CODE Family Communication: No family present ar besider Disposition Plan: Pending further clinical evaluation and improvement; Needs a BKA   Status is: Inpatient  Remains inpatient appropriate because:Unsafe  d/c plan, IV treatments appropriate due to intensity of illness or inability to take PO and Inpatient level of care appropriate due to severity of illness   Dispo: The patient is from: Home              Anticipated d/c is to: TBD              Anticipated d/c date is: > 3 days              Patient currently is not medically stable to d/c.  Consultants:   Podiatry [name]  Nephrology Dr. Jonnie Finner  Vascular Surgery Dr. Rae Mar  Orthopedic Surgery [name]  Discussed case with ID [name]   Hand Surgery Dr. Fredna Dow   Procedures:  ABI's to be done  Antimicrobials:  Anti-infectives (From admission, onward)   Start     Dose/Rate Route Frequency Ordered Stop   09/13/20 0933  Vancomycin (VANCOCIN) 750-5 MG/150ML-% IVPB  Note to Pharmacy: Anthonette Legato, Melisa   : cabinet override      09/13/20 0933 09/13/20 2144   09/11/20 1200  vancomycin (VANCOCIN) IVPB 750 mg/150 ml premix        750 mg 150 mL/hr over 60 Minutes Intravenous Every M-W-F (Hemodialysis) 09/11/20 0238     09/11/20 1130  ceFEPIme (MAXIPIME) 1 g in sodium chloride 0.9 % 100 mL IVPB        1 g 200 mL/hr over 30 Minutes Intravenous Every 24 hours 09/11/20 0919     09/11/20 0300  piperacillin-tazobactam (ZOSYN) IVPB 2.25 g  Status:  Discontinued        2.25 g 100 mL/hr over 30 Minutes Intravenous Every 8 hours 09/11/20 0238 09/11/20 0858   09/11/20 0300  vancomycin (VANCOREADY) IVPB 1250 mg/250 mL        1,250 mg 166.7 mL/hr over 90 Minutes Intravenous  Once 09/11/20 0238 09/11/20 1740        Subjective: Seen and examined at bedside and he states he was still hurting in his foot. Feels like it is more swollen. No Nausea or Vomiting. Appreciative of referral to [name] and he is likely going to proceed with the surgery that is posted for Monday 09/16/20. No CP or SOB. No other concerns or complaints at this time. Hand Surgery to evaluate wrist lesion and patient to be made NPO for possible debridement.    Objective: Vitals:   09/13/20 1140 09/13/20 1148 09/13/20 1246 09/13/20 2029  BP:   98/69 (!) 173/150  Pulse:  62 100 82  Resp:    16  Temp:   (!) 97.5 F (36.4 C) 98.7 F (37.1 C)  TempSrc:   Oral   SpO2:  99% 90% (!) 83%  Weight: 68 kg     Height:        Intake/Output Summary (Last 24 hours) at 09/14/2020 0916 Last data filed at 09/14/2020 0500 Gross per 24 hour  Intake 1280 ml  Output 1488 ml  Net -208 ml   Filed Weights   09/11/20 1435 09/13/20 0839 09/13/20 1140  Weight: 69.4 kg 69.4 kg 68 kg   Examination Physical Exam:  Constitutional: WN/WD mildly ill appearing older appearing than his stated age AAM in NAD but appears a little uncomfortable Eyes: Lids and conjunctivae normal, sclerae anicteric  ENMT: External Ears, Nose appear normal. Grossly normal hearing.  Neck: Appears normal, supple, no cervical masses, normal ROM, no appreciable thyromegaly; no JVD Respiratory: Diminished to auscultation bilaterally, no wheezing, rales, rhonchi or crackles. Normal respiratory effort and patient is not tachypenic. No accessory muscle use. Unlabored breathing Cardiovascular: RRR, no murmurs / rubs / gallops. S1 and S2 auscultated. No extremity edema. 2+ pedal pulses. No carotid bruits.  Abdomen: Soft, non-tender, non-distended. Bowel sounds positive.  GU: Deferred. Musculoskeletal: No clubbing / cyanosis of digits/nails. Has a LUE Fist[place]a, Right tunneled TDC, and has some toes missing. Left leg is erythematous  Skin: Has a left plantar aspect diabetic foot ulcer as well a a Left volar wrist lesion that is indurated and discolored/purple colored with a small skin ulceration; Warm and dry.  Neurologic: CN 2-12 grossly intact with no focal deficits. Romberg sign and cerebellar reflexes not assessed.  Psychiatric: Normal judgment and insight. Alert and oriented x 3. Normal mood and appropriate affect.   Data Reviewed: I have personally reviewed following labs and imaging  studies  CBC: Recent Labs  Lab 09/10/20 1655 09/11/20 0517 09/12/20 0330 09/13/20 0123 09/14/20 8144  WBC 31.0* 29.9* 23.1* 26.7* 26.3*  NEUTROABS 28.8*  --  20.7* 22.9* 20.0*  HGB 10.8* 10.2* 9.9* 9.7* 9.5*  HCT 36.1* 33.4* 32.2* 29.0* 29.6*  MCV 93.8 92.3 92.0 88.1 89.4  PLT 203 191 204 201 063   Basic Metabolic Panel: Recent Labs  Lab 09/10/20 1655 09/11/20 0517 09/11/20 1453 09/12/20 0330 09/13/20 0123 09/14/20 0204  NA 128*  --  129* 133* 127* 130*  K 5.1  --  5.4* 3.6 4.1 4.1  CL 87*  --  89* 89* 87* 90*  CO2 19*  --  16* '22 25 28  ' GLUCOSE 328*  --  380* 189* 177* 241*  BUN 42*  --  59* 31* 45* 32*  CREATININE 8.65* 9.44* 9.90* 6.07* 6.99* 5.04*  CALCIUM 8.9  --  8.5* 8.5* 9.1 9.3  MG  --   --  2.5* 2.4 2.3 2.3  PHOS  --   --  7.8* 7.2* 5.4* 4.7*   GFR: Estimated Creatinine Clearance: 18.2 mL/min (A) (by C-G formula based on SCr of 5.04 mg/dL (H)). Liver Function Tests: Recent Labs  Lab 09/10/20 1655 09/11/20 1453 09/12/20 0330 09/13/20 0123 09/14/20 0204  AST 14* '15 17 17 15  ' ALT '12 12 12 13 13  ' ALKPHOS 180* 171* 179* 164* 153*  BILITOT 2.4* 2.6* 1.6* 1.1 1.1  PROT 7.5 6.5 6.8 6.8 6.4*  ALBUMIN 2.4* 2.1* 2.0* 2.0* 1.7*   No results for input(s): LIPASE, AMYLASE in the last 168 hours. No results for input(s): AMMONIA in the last 168 hours. Coagulation Profile: No results for input(s): INR, PROTIME in the last 168 hours. Cardiac Enzymes: No results for input(s): CKTOTAL, CKMB, CKMBINDEX, TROPONINI in the last 168 hours. BNP (last 3 results) No results for input(s): PROBNP in the last 8760 hours. HbA1C: No results for input(s): HGBA1C in the last 72 hours. CBG: Recent Labs  Lab 09/12/20 2107 09/13/20 0651 09/13/20 1243 09/13/20 2210 09/14/20 0647  GLUCAP 215* 156* 182* 183* 202*   Lipid Profile: No results for input(s): CHOL, HDL, LDLCALC, TRIG, CHOLHDL, LDLDIRECT in the last 72 hours. Thyroid Function Tests: No results for input(s):  TSH, T4TOTAL, FREET4, T3FREE, THYROIDAB in the last 72 hours. Anemia Panel: Recent Labs    09/13/20 0123 09/13/20 0124  VITAMINB12 1,152*  --   FOLATE  --  7.6  FERRITIN 3,890*  --   TIBC 125*  --   IRON 42*  --   RETICCTPCT  --  0.8   Sepsis Labs: Recent Labs  Lab 09/10/20 1656 09/11/20 0303  LATICACIDVEN 2.0* 1.7    Recent Results (from the past 240 hour(s))  Blood culture (routine x 2)     Status: None (Preliminary result)   Collection Time: 09/10/20  4:56 PM   Specimen: BLOOD  Result Value Ref Range Status   Specimen Description BLOOD SITE NOT SPECIFIED  Final   Special Requests   Final    BOTTLES DRAWN AEROBIC AND ANAEROBIC Blood Culture adequate volume   Culture   Final    NO GROWTH 3 DAYS Performed at [facility], [place] [address], [place], [place] [address]    Report Status PENDING  Incomplete  Resp Panel by RT-PCR (Flu A&B, Covid) Nasopharyngeal Swab     Status: None   Collection Time: 09/11/20  4:41 AM   Specimen: Nasopharyngeal Swab; Nasopharyngeal([name]) swabs in vial transport medium  Result Value Ref Range Status   SARS Coronavirus 2 by RT PCR NEGATIVE NEGATIVE Final  Comment: (NOTE) SARS-CoV-2 target nucleic acids are NOT DETECTED.  The SARS-CoV-2 RNA is generally detectable in upper respiratory specimens during the acute phase of infection. The lowest concentration of SARS-CoV-2 viral copies this assay can detect is 138 copies/mL. A negative result does not preclude SARS-Cov-2 infection and should not be used as the sole basis for treatment or other patient management decisions. A negative result may occur with  improper specimen collection/handling, submission of specimen other than nasopharyngeal swab, presence of viral mutation(s) within the areas targeted by this assay, and inadequate number of viral copies(<138 copies/mL). A negative result must be combined with clinical observations, patient history, and epidemiological information. The  expected result is Negative.  Fact Sheet for Patients:  EntrepreneurPulse.com.au  Fact Sheet for Healthcare Providers:  IncredibleEmployment.be  This test is no t yet approved or cleared by the [place] FDA and  has been authorized for detection and/or diagnosis of SARS-CoV-2 by FDA under an Emergency Use Authorization (EUA). This EUA will remain  in effect (meaning this test can be used) for the duration of the COVID-19 declaration under Section 564(b)(1) of the Act, 21 U.S.C.section 360bbb-3(b)(1), unless the authorization is terminated  or revoked sooner.       Influenza A by PCR NEGATIVE NEGATIVE Final   Influenza B by PCR NEGATIVE NEGATIVE Final    Comment: (NOTE) The Xpert Xpress SARS-CoV-2/FLU/RSV plus assay is intended as an aid in the diagnosis of influenza from Nasopharyngeal swab specimens and should not be used as a sole basis for treatment. Nasal washings and aspirates are unacceptable for Xpert Xpress SARS-CoV-2/FLU/RSV testing.  Fact Sheet for Patients: EntrepreneurPulse.com.au  Fact Sheet for Healthcare Providers: IncredibleEmployment.be  This test is not yet approved or cleared by the [place] FDA and has been authorized for detection and/or diagnosis of SARS-CoV-2 by FDA under an Emergency Use Authorization (EUA). This EUA will remain in effect (meaning this test can be used) for the duration of the COVID-19 declaration under Section 564(b)(1) of the Act, 21 U.S.C. section 360bbb-3(b)(1), unless the authorization is terminated or revoked.  Performed at [facility], [place] [address], [place], [place] [address]   Blood culture (routine x 2)     Status: None (Preliminary result)   Collection Time: 09/13/20  1:23 AM   Specimen: BLOOD  Result Value Ref Range Status   Specimen Description BLOOD RIGHT ANTECUBITAL  Final   Special Requests   Final    BOTTLES DRAWN AEROBIC  AND ANAEROBIC Blood Culture adequate volume   Culture   Final    NO GROWTH < 12 HOURS Performed at [facility], [place] [address], [place], [place] [address]    Report Status PENDING  Incomplete     RN Pressure Injury Documentation:     Estimated body mass index is 23.48 kg/m as calculated from the following:   Height as of this encounter: '5\' 7"'  (1.702 m).   Weight as of this encounter: 68 kg.  Malnutrition Type: Nutrition Problem: Moderate Malnutrition Etiology: chronic illness (ESRD on HD) Malnutrition Characteristics: Signs/Symptoms: mild fat depletion,moderate fat depletion,moderate muscle depletion,severe muscle depletion Nutrition Interventions:  Interventions: Prostat,MVI  Radiology Studies: DG Wrist Complete Left  Result Date: 09/13/2020 CLINICAL DATA:  Possible osteomyelitis of left wrist. EXAM: LEFT WRIST - COMPLETE 3+ VIEW COMPARISON:  None. FINDINGS: There is no evidence of fracture or dislocation. There is no evidence of arthropathy or other focal bone abnormality. Vascular calcifications are noted. No definite lytic destruction is seen  to suggest osteomyelitis. IMPRESSION: No definite abnormality seen in the left wrist. No definite evidence of osteomyelitis. Electronically Signed   By: Marijo Conception M.D.   On: 09/13/2020 15:23   Scheduled Meds: . (feeding supplement) PROSource Plus  30 mL Oral TID BM  . calcitRIOL  2.5 mcg Oral Q M,W,F-HD  . Chlorhexidine Gluconate Cloth  6 each Topical Q0600  . cinacalcet  60 mg Oral Q breakfast  . darbepoetin (ARANESP) injection - DIALYSIS  25 mcg Intravenous Q Fri-HD  . heparin  5,000 Units Subcutaneous Q8H  . insulin aspart  0-5 Units Subcutaneous QHS  . insulin aspart  0-6 Units Subcutaneous TID WC  . insulin glargine  6 Units Subcutaneous QHS  . multivitamin  1 tablet Oral Daily  . sevelamer carbonate  4,000 mg Oral TID WC  . sodium hypochlorite  1 application Irrigation Daily   Continuous Infusions: . ceFEPime  (MAXIPIME) IV 1 g (09/13/20 1347)  . vancomycin Stopped (09/13/20 1231)    LOS: 3 days   Kerney Elbe, DO [facility] PAGER is on [place]  If 7PM-7AM, please contact night-coverage [email or web address]

## 2020-09-14 NOTE — Consult Note (Signed)
[name] is an 39 y.o. male.   Consult for: left wrist lesion Consult from: Dr. Alfredia Ferguson HPI: 39 yo male with history of diabetes and renal failure.  Has had lesion left volar wrist 2-3 weeks.  Is unsure how it started.  It is painful.  There is an associated wound with the lesion.  Case discussed with Dr. Alfredia Ferguson and his note from 09/14/2020 reviewed. Xrays viewed and interpreted by me: ap, lateral, oblique views left wrist show no fractures, dislocations, radioopaque foreign bodies. MRI left wrist from 09/14/20 shows heterogenous collection in volar soft tissues at wrist.  Superficial to fascia.  Do not see involvement of tendons.   Labs reviewed: WBC 26.3  Allergies: No Known Allergies  Past Medical History:  Diagnosis Date  . Anemia   . Diabetes mellitus without complication ([place])   . Diabetic gastroparesis ([place])   . Dialysis patient ([place])   . Hypertension   . Renal disorder    Dialysis  . Sepsis [facility])     Past Surgical History:  Procedure Laterality Date  . AMPUTATION Right 02/02/2018   Procedure: RIGHT FIFTH TOE AND METATARSAL AMPUTATION. Filetted toe flap metatarsal resection. Debridement Plantar Foot wound;  Surgeon: Evelina Bucy, DPM;  Location: [place];  Service: Podiatry;  Laterality: Right;  . AMPUTATION Left 08/20/2018   Procedure: FIFTH METATARSAL BONE BIOPSY;  Surgeon: Evelina Bucy, DPM;  Location: [place];  Service: Podiatry;  Laterality: Left;  . AMPUTATION Left 10/28/2018   Procedure: LEFT GREAT TOE AMPUTATION;  Surgeon: Evelina Bucy, DPM;  Location: [place];  Service: Podiatry;  Laterality: Left;  . APPLICATION OF WOUND VAC  02/02/2018   Procedure: APPLICATION OF WOUND VAC  Right Foot;  Surgeon: Evelina Bucy, DPM;  Location: [place];  Service: Podiatry;;  . APPLICATION OF WOUND VAC Left 10/28/2018   Procedure: APPLICATION OF WOUND VAC LEFT TOE;  Surgeon: Evelina Bucy, DPM;  Location: [place];  Service: Podiatry;  Laterality: Left;  . APPLICATION OF WOUND  VAC Left 11/01/2018   Procedure: APPLICATION OF WOUND VAC;  Surgeon: Evelina Bucy, DPM;  Location: [place];  Service: Podiatry;  Laterality: Left;  . AV FISTULA PLACEMENT     left arm.  . AV FISTULA PLACEMENT Right 12/22/2016   Procedure: INSERTION OF ARTERIOVENOUS (AV) GORE-TEX GRAFT ARM;  Surgeon: Elam Dutch, MD;  Location: [facility] OR;  Service: Vascular;  Laterality: Right;  . AV FISTULA PLACEMENT Left 05/26/2018   Procedure: INSERTION OF  ARTERIOVENOUS (AV) GORE-TEX GRAFT LEFT ARM;  Surgeon: Serafina Mitchell, MD;  Location: [place];  Service: Vascular;  Laterality: Left;  . EYE SURGERY    . I & D EXTREMITY Right 10/31/2017   Procedure: IRRIGATION AND DEBRIDEMENT RIGHT FOOT;  Surgeon: Evelina Bucy, DPM;  Location: [place];  Service: Podiatry;  Laterality: Right;  . I & D EXTREMITY Left 08/20/2018   Procedure: IRRIGATION AND DEBRIDEMENT EXTREMITY WITH SECONDARY WOUND CLOSUREAND APPLICATION OF WOUND VAC LEFT FOOT;  Surgeon: Evelina Bucy, DPM;  Location: [place];  Service: Podiatry;  Laterality: Left;  . I & D EXTREMITY Left 10/20/2018   Procedure: IRRIGATION AND DEBRIDEMENT LEFT FOOT  DEBRIDEMENT LATERAL FOOT WOUND;  Surgeon: Evelina Bucy, DPM;  Location: [place];  Service: Podiatry;  Laterality: Left;  . I & D EXTREMITY Left 10/28/2018   Procedure: IRRIGATION AND DEBRIDEMENT LEFT TOE;  Surgeon: Evelina Bucy, DPM;  Location: [place];  Service: Podiatry;  Laterality:  Left;  . INSERTION OF DIALYSIS CATHETER     Right subclavian  . IR AV DIALY SHUNT INTRO NEEDLE/INTRACATH INITIAL W/PTA/IMG RIGHT Right 02/05/2018  . IR FLUORO GUIDE CV LINE RIGHT  01/31/2020  . IR THROMBECTOMY AV FISTULA W/THROMBOLYSIS/PTA INC/SHUNT/IMG LEFT Left 08/24/2018  . IR THROMBECTOMY AV FISTULA W/THROMBOLYSIS/PTA INC/SHUNT/IMG LEFT Left 01/06/2019  . IR US GUIDE VASC ACCESS LEFT  08/24/2018  . IR US GUIDE VASC ACCESS RIGHT  02/05/2018  . IR US GUIDE VASC ACCESS RIGHT  01/31/2020  . IRRIGATION AND DEBRIDEMENT FOOT Right  10/23/2018   Procedure: Irrigation and Debridement to tendon, Left Foot;  Surgeon: Evelina Bucy, DPM;  Location: [place];  Service: Podiatry;  Laterality: Right;  . IRRIGATION AND DEBRIDEMENT FOOT Left 11/01/2018   Procedure: IRRIGATION AND DEBRIDEMENT PARTIAL WOUND CLOSURE LOCAL TISSUE TRANSFER AND FLAP ROTATION, LEFT FOOT;  Surgeon: Evelina Bucy, DPM;  Location: [place];  Service: Podiatry;  Laterality: Left;  . TRANSMETATARSAL AMPUTATION N/A 08/18/2018   Procedure: IRRIGATION AND DEBRIDEMENT OF LEFT 5TH TOE AND TRANSMETATARSAL, WITH PARTICAL LEFT 5TH TOE AND METATARSAL AMPUTATION, BONE BIOPSY, WOUND VAC APPLICATION.;  Surgeon: Evelina Bucy, DPM;  Location: [place];  Service: Podiatry;  Laterality: N/A;  . UPPER EXTREMITY VENOGRAPHY N/A 11/16/2016   Procedure: Upper Extremity Venography - Right Central;  Surgeon: Elam Dutch, MD;  Location: [place] [facility];  Service: Cardiovascular;  Laterality: N/A;  . UPPER EXTREMITY VENOGRAPHY N/A 05/25/2018   Procedure: UPPER EXTREMITY VENOGRAPHY - Bilateral;  Surgeon: Marty Heck, MD;  Location: [place] [facility];  Service: Cardiovascular;  Laterality: N/A;    Family History: Family History  Problem Relation Age of Onset  . Diabetes Mellitus II Other   . Diabetes Father   . Renal Disease Father        ESRD    Social History:   reports that he has never smoked. He has never used smokeless tobacco. He reports that he does not drink alcohol and does not use drugs.  Medications: Medications Prior to Admission  Medication Sig Dispense Refill  . albuterol (VENTOLIN HFA) 108 (90 Base) MCG/ACT inhaler Inhale 2 puffs into the lungs every 6 (six) hours as needed for wheezing or shortness of breath. 6.7 g 0  . Blood Glucose Monitoring Suppl (ONETOUCH VERIO FLEX SYSTEM) w/Device KIT 1 Bag by Does not apply route 3 (three) times daily before meals. 1 kit 0  . calcitRIOL (ROCALTROL) 0.5 MCG capsule Take 5 capsules (2.5 mcg total) by mouth  every Monday, Wednesday, and Friday with hemodialysis. 30 capsule 0  . cinacalcet (SENSIPAR) 60 MG tablet SMARTSIG:2 Tablet(s) By Mouth Every Evening    . doxycycline (VIBRA-TABS) 100 MG tablet Take 100 mg by mouth 2 (two) times daily.    Marland Kitchen glucose blood (ONETOUCH VERIO) test strip Check blood sugars three times daily 100 each 8  . insulin glargine (LANTUS SOLOSTAR) 100 UNIT/ML Solostar Pen Inject 12 Units into the skin daily. (Patient taking differently: Inject 6 Units into the skin at bedtime.) 5 pen PRN  . Lancets (ONETOUCH DELICA PLUS [id number]) MISC Apply topically.    . Methoxy PEG-Epoetin Beta (MIRCERA IJ) Dialysis med    . NOVOLOG 100 UNIT/ML injection INJECT 6 UNITS SUBCUTANEOUSLY TWICE DAILY WITH A MEAL 10 mL 3  . sevelamer carbonate (RENVELA) 800 MG tablet Take 2,400-4,000 mg by mouth See admin instructions. Take 5 tablets with meals then take 3 tablets with snacks    . becaplermin (REGRANEX) 0.01 %  gel Apply 1 application topically daily. (Patient not taking: No sig reported) 15 g 0    Results for orders placed or performed during the hospital encounter of 09/10/20 (from the past 48 hour(s))  Glucose, capillary     Status: Abnormal   Collection Time: 09/12/20  9:07 PM  Result Value Ref Range   Glucose-Capillary 215 (H) 70 - 99 mg/dL    Comment: Glucose reference range applies only to samples taken after fasting for at least 8 hours.  Blood culture (routine x 2)     Status: None (Preliminary result)   Collection Time: 09/13/20  1:23 AM   Specimen: BLOOD  Result Value Ref Range   Specimen Description BLOOD RIGHT ANTECUBITAL    Special Requests      BOTTLES DRAWN AEROBIC AND ANAEROBIC Blood Culture adequate volume   Culture      NO GROWTH 1 DAY Performed at [facility], [place] [address], [place], [place] [address]    Report Status PENDING   Comprehensive metabolic panel     Status: Abnormal   Collection Time: 09/13/20  1:23 AM  Result Value Ref Range   Sodium 127 (L)  135 - 145 mmol/L   Potassium 4.1 3.5 - 5.1 mmol/L   Chloride 87 (L) 98 - 111 mmol/L   CO2 25 22 - 32 mmol/L   Glucose, Bld 177 (H) 70 - 99 mg/dL    Comment: Glucose reference range applies only to samples taken after fasting for at least 8 hours.   BUN 45 (H) 6 - 20 mg/dL   Creatinine, Ser 6.99 (H) 0.61 - 1.24 mg/dL   Calcium 9.1 8.9 - 10.3 mg/dL   Total Protein 6.8 6.5 - 8.1 g/dL   Albumin 2.0 (L) 3.5 - 5.0 g/dL   AST 17 15 - 41 U/L   ALT 13 0 - 44 U/L   Alkaline Phosphatase 164 (H) 38 - 126 U/L   Total Bilirubin 1.1 0.3 - 1.2 mg/dL   GFR, Estimated 9 (L) >60 mL/min    Comment: (NOTE) Calculated using the CKD-EPI Creatinine Equation (2021)    Anion gap 15 5 - 15    Comment: Performed at [facility], [place] [address] [place], [place] [address]  CBC with Differential/Platelet     Status: Abnormal   Collection Time: 09/13/20  1:23 AM  Result Value Ref Range   WBC 26.7 (H) 4.0 - 10.5 K/uL   RBC 3.29 (L) 4.22 - 5.81 MIL/uL   Hemoglobin 9.7 (L) 13.0 - 17.0 g/dL   HCT 29.0 (L) 39.0 - 52.0 %   MCV 88.1 80.0 - 100.0 fL   MCH 29.5 26.0 - 34.0 pg   MCHC 33.4 30.0 - 36.0 g/dL   RDW 14.1 11.5 - 15.5 %   Platelets 201 150 - 400 K/uL   nRBC 0.0 0.0 - 0.2 %   Neutrophils Relative % 87 %   Neutro Abs 22.9 (H) 1.7 - 7.7 K/uL   Lymphocytes Relative 3 %   Lymphs Abs 0.9 0.7 - 4.0 K/uL   Monocytes Relative 7 %   Monocytes Absolute 1.9 (H) 0.1 - 1.0 K/uL   Eosinophils Relative 0 %   Eosinophils Absolute 0.1 0.0 - 0.5 K/uL   Basophils Relative 0 %   Basophils Absolute 0.1 0.0 - 0.1 K/uL   WBC Morphology INCREASED BANDS (>20% BANDS)    Immature Granulocytes 3 %   Abs Immature Granulocytes 0.89 (H) 0.00 - 0.07 K/uL  Polychromasia PRESENT     Comment: Performed at [facility], [place] [address] [place], [place] [address]  Magnesium     Status: None   Collection Time: 09/13/20  1:23 AM  Result Value Ref Range   Magnesium 2.3 1.7 - 2.4 mg/dL    Comment: Performed at [facility] [address], [place], [place] [address]  Phosphorus     Status: Abnormal   Collection Time: 09/13/20  1:23 AM  Result Value Ref Range   Phosphorus 5.4 (H) 2.5 - 4.6 mg/dL    Comment: Performed at [facility] [address], [place], [place] [address]  Vitamin B12     Status: Abnormal   Collection Time: 09/13/20  1:23 AM  Result Value Ref Range   Vitamin B-12 1,152 (H) 180 - 914 pg/mL    Comment: (NOTE) This assay is not validated for testing neonatal or myeloproliferative syndrome specimens for Vitamin B12 levels. Performed at [facility], [place] [address] [place], [place] [address]   Iron and TIBC     Status: Abnormal   Collection Time: 09/13/20  1:23 AM  Result Value Ref Range   Iron 42 (L) 45 - 182 ug/dL   TIBC 125 (L) 250 - 450 ug/dL   Saturation Ratios 34 17.9 - 39.5 %   UIBC 83 ug/dL    Comment: Performed at [facility], [place] [address], [place], [place] [address]  Ferritin     Status: Abnormal   Collection Time: 09/13/20  1:23 AM  Result Value Ref Range   Ferritin 3,890 (H) 24 - 336 ng/mL    Comment: Performed at [facility], [place] [address], [place], [place] [address]  Folate     Status: None   Collection Time: 09/13/20  1:24 AM  Result Value Ref Range   Folate 7.6 >5.9 ng/mL    Comment: Performed at [facility] [address] [place], [place] [address]  Reticulocytes     Status: Abnormal   Collection Time: 09/13/20  1:24 AM  Result Value Ref Range   Retic Ct Pct 0.8 0.4 - 3.1 %   RBC. 3.32 (L) 4.22 - 5.81 MIL/uL   Retic Count, Absolute 25.2 19.0 - 186.0 K/uL   Immature Retic Fract 11.7 2.3 - 15.9 %    Comment: Performed at [facility] [address] [place], [place] [address]  Glucose, capillary     Status: Abnormal   Collection Time: 09/13/20  6:51 AM  Result Value Ref Range   Glucose-Capillary 156 (H) 70 - 99 mg/dL    Comment: Glucose reference range applies only to samples taken after fasting for at  least 8 hours.  Hepatitis B surface antigen     Status: None   Collection Time: 09/13/20  8:56 AM  Result Value Ref Range   Hepatitis B Surface Ag NON REACTIVE NON REACTIVE    Comment: Performed at [facility] [address], [place],  [address]  Hepatitis B surface antibody,qualitative     Status: Abnormal   Collection Time: 09/13/20  8:56 AM  Result Value Ref Range   Hep B S Ab Reactive (A) NON REACTIVE    Comment: (NOTE) Consistent with immunity, greater than 9.9 mIU/mL.  Performed at [facility], [place] [address], [place],  [address]   Hepatitis B core antibody, total     Status: None   Collection Time:  09/13/20  8:56 AM  Result Value Ref Range   Hep B Core Total Ab NON REACTIVE NON REACTIVE    Comment: Performed at [facility] [address], [place], [place] [address]  Glucose, capillary     Status: Abnormal   Collection Time: 09/13/20 12:43 PM  Result Value Ref Range   Glucose-Capillary 182 (H) 70 - 99 mg/dL    Comment: Glucose reference range applies only to samples taken after fasting for at least 8 hours.  Glucose, capillary     Status: Abnormal   Collection Time: 09/13/20 10:10 PM  Result Value Ref Range   Glucose-Capillary 183 (H) 70 - 99 mg/dL    Comment: Glucose reference range applies only to samples taken after fasting for at least 8 hours.  CBC with Differential/Platelet     Status: Abnormal   Collection Time: 09/14/20  2:04 AM  Result Value Ref Range   WBC 26.3 (H) 4.0 - 10.5 K/uL   RBC 3.31 (L) 4.22 - 5.81 MIL/uL   Hemoglobin 9.5 (L) 13.0 - 17.0 g/dL   HCT 29.6 (L) 39.0 - 52.0 %   MCV 89.4 80.0 - 100.0 fL   MCH 28.7 26.0 - 34.0 pg   MCHC 32.1 30.0 - 36.0 g/dL   RDW 14.4 11.5 - 15.5 %   Platelets 191 150 - 400 K/uL   nRBC 0.0 0.0 - 0.2 %   Neutrophils Relative % 74 %   Neutro Abs 20.0 (H) 1.7 - 7.7 K/uL   Lymphocytes Relative 7 %   Lymphs Abs 1.7 0.7 - 4.0 K/uL   Monocytes Relative 10 %   Monocytes Absolute 2.6 (H)  0.1 - 1.0 K/uL   Eosinophils Relative 1 %   Eosinophils Absolute 0.1 0.0 - 0.5 K/uL   Basophils Relative 1 %   Basophils Absolute 0.1 0.0 - 0.1 K/uL   WBC Morphology INCREASED BANDS (>20% BANDS)    Immature Granulocytes 7 %   Abs Immature Granulocytes 1.83 (H) 0.00 - 0.07 K/uL   Target Cells PRESENT     Comment: Performed at [facility], [address], [place], [place] [address]  Comprehensive metabolic panel     Status: Abnormal   Collection Time: 09/14/20  2:04 AM  Result Value Ref Range   Sodium 130 (L) 135 - 145 mmol/L   Potassium 4.1 3.5 - 5.1 mmol/L   Chloride 90 (L) 98 - 111 mmol/L   CO2 28 22 - 32 mmol/L   Glucose, Bld 241 (H) 70 - 99 mg/dL    Comment: Glucose reference range applies only to samples taken after fasting for at least 8 hours.   BUN 32 (H) 6 - 20 mg/dL   Creatinine, Ser 5.04 (H) 0.61 - 1.24 mg/dL   Calcium 9.3 8.9 - 10.3 mg/dL   Total Protein 6.4 (L) 6.5 - 8.1 g/dL   Albumin 1.7 (L) 3.5 - 5.0 g/dL   AST 15 15 - 41 U/L   ALT 13 0 - 44 U/L   Alkaline Phosphatase 153 (H) 38 - 126 U/L   Total Bilirubin 1.1 0.3 - 1.2 mg/dL   GFR, Estimated 14 (L) >60 mL/min    Comment: (NOTE) Calculated using the CKD-EPI Creatinine Equation (2021)    Anion gap 12 5 - 15    Comment: Performed at [facility], [place] [address] [place], [place] [address]  Magnesium     Status: None   Collection Time: 09/14/20  2:04 AM  Result Value Ref Range   Magnesium 2.3 1.7 - 2.4 mg/dL    Comment: Performed at [facility], [place] [address], [place], [place] [address]  Phosphorus     Status: Abnormal   Collection Time: 09/14/20  2:04 AM  Result Value Ref Range   Phosphorus 4.7 (H) 2.5 - 4.6 mg/dL    Comment: Performed at [facility] [address], [place], [place] [address]  Glucose, capillary     Status: Abnormal   Collection Time: 09/14/20  6:47 AM  Result Value Ref Range   Glucose-Capillary 202 (H) 70 - 99 mg/dL    Comment: Glucose reference range applies  only to samples taken after fasting for at least 8 hours.  Glucose, capillary     Status: Abnormal   Collection Time: 09/14/20 12:11 PM  Result Value Ref Range   Glucose-Capillary 201 (H) 70 - 99 mg/dL    Comment: Glucose reference range applies only to samples taken after fasting for at least 8 hours.  Glucose, capillary     Status: Abnormal   Collection Time: 09/14/20  4:56 PM  Result Value Ref Range   Glucose-Capillary 364 (H) 70 - 99 mg/dL    Comment: Glucose reference range applies only to samples taken after fasting for at least 8 hours.    DG Wrist Complete Left  Result Date: 09/13/2020 CLINICAL DATA:  Possible osteomyelitis of left wrist. EXAM: LEFT WRIST - COMPLETE 3+ VIEW COMPARISON:  None. FINDINGS: There is no evidence of fracture or dislocation. There is no evidence of arthropathy or other focal bone abnormality. Vascular calcifications are noted. No definite lytic destruction is seen to suggest osteomyelitis. IMPRESSION: No definite abnormality seen in the left wrist. No definite evidence of osteomyelitis. Electronically Signed   By: Marijo Conception M.D.   On: 09/13/2020 15:23   MR WRIST LEFT WO CONTRAST  Result Date: 09/14/2020 CLINICAL DATA:  Skin ulceration on the volar aspect the left wrist. EXAM: MR OF THE LEFT WRIST WITHOUT CONTRAST TECHNIQUE: Multiplanar, multisequence MR imaging of the left wrist was performed. No intravenous contrast was administered. COMPARISON:  Plain films left wrist 09/13/2020. FINDINGS: Bones/Joint/Cartilage There is no marrow edema to suggest osteomyelitis. No fracture, stress change or worrisome lesion. No joint effusion. No evidence of arthropathy. Ligaments Intact. Muscles and Tendons Mild, ill-defined edema and mild atrophy are seen in the musculature of the hypothenar eminence. Musculature is otherwise negative. No evidence of tenosynovitis. Soft tissues Skin wound is seen on the volar aspect of the wrist. Deep to the wound, there is fluid  measuring approximately 0.7 cm AP by 3.2 cm craniocaudal by 1.6 cm transverse. A small amount of fluid in the pisotriquetral recess is incidentally noted. IMPRESSION: Skin ulceration on the volar aspect of the wrist with underlying somewhat ill-defined fluid compatible with phlegmon or early abscess. Negative for osteomyelitis or septic joint. Mild edema in the musculature of the hypothenar eminence could be due to myositis or early atrophy. Electronically Signed   By: Inge Rise M.D.   On: 09/14/2020 10:27      Blood pressure 124/71, pulse (!) 102, temperature 98.7 F (37.1 C), resp. rate 14, height '5\' 7"'  (1.702 m), weight 68 kg, SpO2 100 %.  General appearance: alert, cooperative and appears stated age Head: Normocephalic, without obvious abnormality, atraumatic Neck: supple, symmetrical, trachea midline Extremities: Intact sensation and capillary refill all digits.  +epl/fpl/io.  Volar left wrist with indurated area with central wound with eschar.  Mild surrounding rubor.  No proximal streaking.  Tender to palpation. Pulses: 2+ and symmetric Skin: Skin color, texture, turgor normal. No rashes or lesions Neurologic: Grossly normal Incision/Wound: as above  Assessment/Plan Left wrist lesion.  Possible early abscess vs necrosis.  MRI read suggests early abscess vs phlegmon.  Discussed incision and drainage with patient to try to resolve lesion.  He wishes to proceed.  Risks, benefits and alternatives of surgery were discussed including risks of blood loss, infection, damage to nerves/vessels/tendons/ligament/bone, failure of surgery, need for additional surgery, complication with wound healing, stiffness, need for repeat irrigation and debridement.  He voiced understanding of these risks and elected to proceed.    Leanora Cover 09/14/2020, 6:36 PM

## 2020-09-14 NOTE — Progress Notes (Signed)
Pt refused to do MRI tonight but agreed to have it done first thing in the morning.

## 2020-09-14 NOTE — Plan of Care (Signed)

## 2020-09-14 NOTE — Progress Notes (Signed)
Pt refuses to check his blood sugar. Pt cussing out and upset of being NPO since 2 pm. Also refuses to take his lantus insulin. Pt told RN "I will only take my long acting when I come back from surgery!Marland Kitchen"

## 2020-09-14 NOTE — Anesthesia Preprocedure Evaluation (Addendum)
Anesthesia Evaluation  Patient identified by MRN, date of birth, ID band Patient awake    Reviewed: Allergy & Precautions, H&P , NPO status , Patient's Chart, lab work & pertinent test results  Airway Mallampati: II  TM Distance: >3 FB Neck ROM: Full    Dental  (+) Dental Advisory Given, Poor Dentition, Chipped   Pulmonary neg pulmonary ROS,    Pulmonary exam normal breath sounds clear to auscultation       Cardiovascular hypertension, Normal cardiovascular exam Rhythm:Regular Rate:Normal     Neuro/Psych  Neuromuscular disease negative psych ROS   GI/Hepatic negative GI ROS, Neg liver ROS,   Endo/Other  diabetes, Type 1, Insulin Dependent  Renal/GU Dialysis and ESRFRenal disease  negative genitourinary   Musculoskeletal  (+) Arthritis ,   Abdominal   Peds  Hematology  (+) Blood dyscrasia, anemia ,   Anesthesia Other Findings   Reproductive/Obstetrics negative OB ROS                         Anesthesia Physical  Anesthesia Plan  ASA: III  Anesthesia Plan: General   Post-op Pain Management:    Induction: Intravenous, Rapid sequence and Cricoid pressure planned  PONV Risk Score and Plan: 3 and Ondansetron, Midazolam and Treatment may vary due to age or medical condition  Airway Management Planned: LMA and Oral ETT  Additional Equipment: None  Intra-op Plan:   Post-operative Plan: Extubation in OR  Informed Consent: I have reviewed the patients History and Physical, chart, labs and discussed the procedure including the risks, benefits and alternatives for the proposed anesthesia with the patient or authorized representative who has indicated his/her understanding and acceptance.     Dental advisory given  Plan Discussed with: CRNA  Anesthesia Plan Comments: (Full meal ~ 5pm. Proceed after 8hrs. Discussed poorly controlled Glc and K with TRH ([place]).)      Anesthesia Quick  Evaluation

## 2020-09-14 NOTE — Progress Notes (Signed)
Pharmacy Antibiotic Note  [name] is a 39 y.o. male admitted on 09/10/2020 with L foot wound infection. X ray findings compatible with osteomyelitis. Pharmacy has been consulted for Vancomycin and Cefepime dosing.. Pt is ESRD on HD MWF. WBC 26.3. Patient afebrile. Vascular, podiatry and ID recommending BKA; patient initially refused but now agreeable. Likely will undergo next week.   Will continue on cefepime dosing; could consider adjusting to 2g with HD if continued next week. Will order vancomycin random level prior to HD on Monday.   Plan: Continue vancomycin 750mg  with HD MWF  Check vancomycin random concentration prior to HD on Monday Start cefepime 1g q24h Monitor HD schedule, podiatry plans, cultures, and clinical progression  Height: 5\' 7"  (170.2 cm) Weight: 68 kg (149 lb 14.6 oz) IBW/kg (Calculated) : 66.1  Temp (24hrs), Avg:98.1 F (36.7 C), Min:97.5 F (36.4 C), Max:98.7 F (37.1 C)  Recent Labs  Lab 09/10/20 1655 09/10/20 1656 09/11/20 0303 09/11/20 0517 09/11/20 1453 09/12/20 0330 09/13/20 0123 09/14/20 0204  WBC 31.0*  --   --  29.9*  --  23.1* 26.7* 26.3*  CREATININE 8.65*  --   --  9.44* 9.90* 6.07* 6.99* 5.04*  LATICACIDVEN  --  2.0* 1.7  --   --   --   --   --     Estimated Creatinine Clearance: 18.2 mL/min (A) (by C-G formula based on SCr of 5.04 mg/dL (H)).    No Known Allergies  Antimicrobials this admission: Zosyn x1 12/15  Vancomycin 12/15 >>  Cefepime 12/15 >>    Microbiology results: 12/14 bcx: ngtd  Thank you for allowing pharmacy to be a part of this patient's care.  Alfonse Spruce, PharmD PGY2 ID Pharmacy Resident Phone between 7 am - 3:30 pm: [phone]  Please check AMION for all [facility] phone numbers After 10:00 PM, call [facility] [phone]   09/14/2020 8:49 AM

## 2020-09-14 NOTE — Progress Notes (Signed)
39 year old male with large ulcer and osteomyelitis of the left foot as previously pictured.  We had recommended left BKA earlier in the week which he initially refused and then changed his mind yesterday.  I did speak with Dr. Sharol Given on the phone this morning who was going to go by and see him but he is in the MRI scanner.  Dr. Sharol Given did review his images and agrees BKA would be the best option and there would no way to get soft tissue coverage with any type of isolated foot amputation likely TMA.  I have tentatively posted him for Monday assuming he is willing to proceed with left BKA.  Marty Heck, MD [facility] Office: [place]

## 2020-09-14 NOTE — Progress Notes (Signed)
Explained to patient on NPO diet for surgery later tonight. Patient cussing and said he knows what he is doing.

## 2020-09-14 NOTE — Progress Notes (Signed)
For the third time, patient is refusing to let RN check his blood sugar despite explaining and educating him the importance of monitoring his blood sugar. Short stay made aware.

## 2020-09-14 NOTE — Progress Notes (Signed)
[facility] Progress Note   Subjective:   Patient seen and examined at bedside.  No new complaints.  Denies CP, SOB, n/v/d, abdominal pain, dizziness and fatigue. Per Vascular plan for BKA Monday.   Objective Vitals:   09/13/20 1140 09/13/20 1148 09/13/20 1246 09/13/20 2029  BP:   98/69 (!) 173/150  Pulse:  62 100 82  Resp:    16  Temp:   (!) 97.5 F (36.4 C) 98.7 F (37.1 C)  TempSrc:   Oral   SpO2:  99% 90% (!) 83%  Weight: 68 kg     Height:       Physical Exam General:WDWN male in NAD Heart:RRR, no mrg Lungs:CTAB, nml WOB Abdomen:soft, NTND Extremities:no LE edema, L foot dressed Dialysis Access: [facility]   Filed Weights   09/11/20 1435 09/13/20 0839 09/13/20 1140  Weight: 69.4 kg 69.4 kg 68 kg    Intake/Output Summary (Last 24 hours) at 09/14/2020 1124 Last data filed at 09/14/2020 0500 Gross per 24 hour  Intake 1280 ml  Output 1488 ml  Net -208 ml    Additional Objective Labs: Basic Metabolic Panel: Recent Labs  Lab 09/12/20 0330 09/13/20 0123 09/14/20 0204  NA 133* 127* 130*  K 3.6 4.1 4.1  CL 89* 87* 90*  CO2 22 25 28   GLUCOSE 189* 177* 241*  BUN 31* 45* 32*  CREATININE 6.07* 6.99* 5.04*  CALCIUM 8.5* 9.1 9.3  PHOS 7.2* 5.4* 4.7*   Liver Function Tests: Recent Labs  Lab 09/12/20 0330 09/13/20 0123 09/14/20 0204  AST 17 17 15   ALT 12 13 13   ALKPHOS 179* 164* 153*  BILITOT 1.6* 1.1 1.1  PROT 6.8 6.8 6.4*  ALBUMIN 2.0* 2.0* 1.7*   CBC: Recent Labs  Lab 09/10/20 1655 09/11/20 0517 09/12/20 0330 09/13/20 0123 09/14/20 0204  WBC 31.0* 29.9* 23.1* 26.7* 26.3*  NEUTROABS 28.8*  --  20.7* 22.9* 20.0*  HGB 10.8* 10.2* 9.9* 9.7* 9.5*  HCT 36.1* 33.4* 32.2* 29.0* 29.6*  MCV 93.8 92.3 92.0 88.1 89.4  PLT 203 191 204 201 191   Blood Culture    Component Value Date/Time   SDES BLOOD RIGHT ANTECUBITAL 09/13/2020 0123   SPECREQUEST  09/13/2020 0123    BOTTLES DRAWN AEROBIC AND ANAEROBIC Blood Culture adequate volume    CULT  09/13/2020 0123    NO GROWTH < 12 HOURS Performed at [facility], Progress [address] [place], [place] [address]    REPTSTATUS PENDING 09/13/2020 0123   CBG: Recent Labs  Lab 09/12/20 2107 09/13/20 0651 09/13/20 1243 09/13/20 2210 09/14/20 0647  GLUCAP 215* 156* 182* 183* 202*   Iron Studies:  Recent Labs    09/13/20 0123  IRON 42*  TIBC 125*  FERRITIN 3,890*   Lab Results  Component Value Date   INR 1.2 07/22/2020   INR 1.0 05/30/2020   INR 1.2 04/24/2019   Studies/Results: DG Wrist Complete Left  Result Date: 09/13/2020 CLINICAL DATA:  Possible osteomyelitis of left wrist. EXAM: LEFT WRIST - COMPLETE 3+ VIEW COMPARISON:  None. FINDINGS: There is no evidence of fracture or dislocation. There is no evidence of arthropathy or other focal bone abnormality. Vascular calcifications are noted. No definite lytic destruction is seen to suggest osteomyelitis. IMPRESSION: No definite abnormality seen in the left wrist. No definite evidence of osteomyelitis. Electronically Signed   By: Marijo Conception M.D.   On: 09/13/2020 15:23   MR WRIST LEFT WO CONTRAST  Result Date: 09/14/2020 CLINICAL  DATA:  Skin ulceration on the volar aspect the left wrist. EXAM: MR OF THE LEFT WRIST WITHOUT CONTRAST TECHNIQUE: Multiplanar, multisequence MR imaging of the left wrist was performed. No intravenous contrast was administered. COMPARISON:  Plain films left wrist 09/13/2020. FINDINGS: Bones/Joint/Cartilage There is no marrow edema to suggest osteomyelitis. No fracture, stress change or worrisome lesion. No joint effusion. No evidence of arthropathy. Ligaments Intact. Muscles and Tendons Mild, ill-defined edema and mild atrophy are seen in the musculature of the hypothenar eminence. Musculature is otherwise negative. No evidence of tenosynovitis. Soft tissues Skin wound is seen on the volar aspect of the wrist. Deep to the wound, there is fluid measuring approximately 0.7 cm AP by 3.2 cm  craniocaudal by 1.6 cm transverse. A small amount of fluid in the pisotriquetral recess is incidentally noted. IMPRESSION: Skin ulceration on the volar aspect of the wrist with underlying somewhat ill-defined fluid compatible with phlegmon or early abscess. Negative for osteomyelitis or septic joint. Mild edema in the musculature of the hypothenar eminence could be due to myositis or early atrophy. Electronically Signed   By: Inge Rise M.D.   On: 09/14/2020 10:27    Medications: . ceFEPime (MAXIPIME) IV 1 g (09/14/20 1057)  . vancomycin Stopped (09/13/20 1231)   . (feeding supplement) PROSource Plus  30 mL Oral TID BM  . calcitRIOL  2.5 mcg Oral Q M,W,F-HD  . Chlorhexidine Gluconate Cloth  6 each Topical Q0600  . cinacalcet  60 mg Oral Q breakfast  . darbepoetin (ARANESP) injection - DIALYSIS  25 mcg Intravenous Q Fri-HD  . heparin  5,000 Units Subcutaneous Q8H  . insulin aspart  0-5 Units Subcutaneous QHS  . insulin aspart  0-6 Units Subcutaneous TID WC  . insulin glargine  6 Units Subcutaneous QHS  . multivitamin  1 tablet Oral Daily  . sevelamer carbonate  4,000 mg Oral TID WC  . sodium hypochlorite  1 application Irrigation Daily    Dialysis Orders: [facility] on MWFon 4 hours 15 minutes. [id number] kgHD Bath2K, 2 calcium bath, Heparin5000 units initial then 5-mid  Calcitriol 3.0 MCG p.o. q. Dialysis no ESA  Assessment/Plan: 1.   ESRD -HD MWF. HD yesterday tolerated well.  Next HD on 12/20.OPVVShad scheduled forR BC AVF12/28/2021. 2. Left footwound withosteomyelitis - on IV vancomycin and Zosyn per admit and Dr. March Rummage podiatry.  Now has agreed to amputation.  Per VVS & ortho BKA is best option and TMA not  Possible.  Tentative plan for surgery on Monday as long as patient in agreement.  3. Hypertension/volume - BP variable.  Appears euvolemic on exam. UF as tolerated. 1.5L removed with HD yesterday.  4. 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 Aranesp 49mcg qHD ordered with HD today.  Ferritin 3890, no FE.  5. Metabolic bone disease - Ca and phos at goal. Continue p.o. calcitriolon HD, Renvela and sensipar 6. Nutrition -carb modified renal protein supplementAlbumin 1.7 add protein supplement 7. Diabetes mellitus type1-per admit   Jen Mow, PA-C [facility] 09/14/2020,11:24 AM  LOS: 3 days

## 2020-09-15 ENCOUNTER — Inpatient Hospital Stay (HOSPITAL_COMMUNITY): Payer: Medicare HMO | Admitting: Anesthesiology

## 2020-09-15 DIAGNOSIS — M86672 Other chronic osteomyelitis, left ankle and foot: Secondary | ICD-10-CM | POA: Diagnosis not present

## 2020-09-15 LAB — GLUCOSE, CAPILLARY
Glucose-Capillary: 220 mg/dL — ABNORMAL HIGH (ref 70–99)
Glucose-Capillary: 257 mg/dL — ABNORMAL HIGH (ref 70–99)
Glucose-Capillary: 268 mg/dL — ABNORMAL HIGH (ref 70–99)
Glucose-Capillary: 364 mg/dL — ABNORMAL HIGH (ref 70–99)
Glucose-Capillary: 375 mg/dL — ABNORMAL HIGH (ref 70–99)
Glucose-Capillary: 383 mg/dL — ABNORMAL HIGH (ref 70–99)

## 2020-09-15 LAB — COMPREHENSIVE METABOLIC PANEL
ALT: 14 U/L (ref 0–44)
AST: 14 U/L — ABNORMAL LOW (ref 15–41)
Albumin: 1.6 g/dL — ABNORMAL LOW (ref 3.5–5.0)
Alkaline Phosphatase: 150 U/L — ABNORMAL HIGH (ref 38–126)
Anion gap: 14 (ref 5–15)
BUN: 46 mg/dL — ABNORMAL HIGH (ref 6–20)
CO2: 22 mmol/L (ref 22–32)
Calcium: 8.8 mg/dL — ABNORMAL LOW (ref 8.9–10.3)
Chloride: 92 mmol/L — ABNORMAL LOW (ref 98–111)
Creatinine, Ser: 6.66 mg/dL — ABNORMAL HIGH (ref 0.61–1.24)
GFR, Estimated: 10 mL/min — ABNORMAL LOW (ref >60)
Glucose, Bld: 327 mg/dL — ABNORMAL HIGH (ref 70–99)
Potassium: 3.6 mmol/L (ref 3.5–5.1)
Sodium: 128 mmol/L — ABNORMAL LOW (ref 135–145)
Total Bilirubin: 0.4 mg/dL (ref 0.3–1.2)
Total Protein: 6 g/dL — ABNORMAL LOW (ref 6.5–8.1)

## 2020-09-15 LAB — CULTURE, BLOOD (ROUTINE X 2)
Culture: NO GROWTH
Special Requests: ADEQUATE

## 2020-09-15 LAB — CBC WITH DIFFERENTIAL/PLATELET
Abs Immature Granulocytes: 0 10*3/uL (ref 0.00–0.07)
Basophils Absolute: 0.2 10*3/uL — ABNORMAL HIGH (ref 0.0–0.1)
Basophils Relative: 1 %
Eosinophils Absolute: 0.2 10*3/uL (ref 0.0–0.5)
Eosinophils Relative: 1 %
HCT: 27.7 % — ABNORMAL LOW (ref 39.0–52.0)
Hemoglobin: 8.5 g/dL — ABNORMAL LOW (ref 13.0–17.0)
Lymphocytes Relative: 4 %
Lymphs Abs: 1 10*3/uL (ref 0.7–4.0)
MCH: 28.2 pg (ref 26.0–34.0)
MCHC: 30.7 g/dL (ref 30.0–36.0)
MCV: 92 fL (ref 80.0–100.0)
Monocytes Absolute: 2.4 10*3/uL — ABNORMAL HIGH (ref 0.1–1.0)
Monocytes Relative: 10 %
Neutro Abs: 20.3 10*3/uL — ABNORMAL HIGH (ref 1.7–7.7)
Neutrophils Relative %: 84 %
Platelets: 213 10*3/uL (ref 150–400)
RBC: 3.01 MIL/uL — ABNORMAL LOW (ref 4.22–5.81)
RDW: 15.1 % (ref 11.5–15.5)
WBC: 24.2 10*3/uL — ABNORMAL HIGH (ref 4.0–10.5)
nRBC: 0 /100 WBC
nRBC: 0.1 % (ref 0.0–0.2)

## 2020-09-15 LAB — MAGNESIUM: Magnesium: 2.3 mg/dL (ref 1.7–2.4)

## 2020-09-15 LAB — PHOSPHORUS: Phosphorus: 4.8 mg/dL — ABNORMAL HIGH (ref 2.5–4.6)

## 2020-09-15 MED ORDER — MEPERIDINE HCL 25 MG/ML IJ SOLN
6.2500 mg | INTRAMUSCULAR | Status: DC | PRN
Start: 2020-09-15 — End: 2020-09-15

## 2020-09-15 MED ORDER — SUCCINYLCHOLINE CHLORIDE 20 MG/ML IJ SOLN
INTRAMUSCULAR | Status: DC | PRN
  Administered 2020-09-15: 140 mg via INTRAVENOUS

## 2020-09-15 MED ORDER — HEPARIN SODIUM (PORCINE) 1000 UNIT/ML IJ SOLN
1.9000 mL | Freq: Once | INTRAMUSCULAR | Status: AC
  Administered 2020-09-15: 03:00:00 1900 [IU] via INTRAVENOUS
  Filled 2020-09-15: qty 1.9

## 2020-09-15 MED ORDER — ONDANSETRON HCL 4 MG/2ML IJ SOLN
INTRAMUSCULAR | Status: DC | PRN
  Administered 2020-09-15: 4 mg via INTRAVENOUS

## 2020-09-15 MED ORDER — PROPOFOL 10 MG/ML IV BOLUS
INTRAVENOUS | Status: DC | PRN
  Administered 2020-09-15: 150 mg via INTRAVENOUS

## 2020-09-15 MED ORDER — DARBEPOETIN ALFA 60 MCG/0.3ML IJ SOSY
60.0000 ug | PREFILLED_SYRINGE | INTRAMUSCULAR | Status: DC
  Filled 2020-09-15: qty 0.3

## 2020-09-15 MED ORDER — INSULIN ASPART 100 UNIT/ML ~~LOC~~ SOLN
SUBCUTANEOUS | Status: DC | PRN
  Administered 2020-09-15: 8 [IU] via SUBCUTANEOUS

## 2020-09-15 MED ORDER — SODIUM CHLORIDE 0.9 % IV BOLUS
250.0000 mL | Freq: Once | INTRAVENOUS | Status: AC
  Administered 2020-09-15: 03:00:00 250 mL via INTRAVENOUS

## 2020-09-15 MED ORDER — PHENYLEPHRINE HCL-NACL 10-0.9 MG/250ML-% IV SOLN
INTRAVENOUS | Status: DC | PRN
  Administered 2020-09-15: 50 ug/min via INTRAVENOUS

## 2020-09-15 MED ORDER — FENTANYL CITRATE (PF) 100 MCG/2ML IJ SOLN
INTRAMUSCULAR | Status: DC | PRN
  Administered 2020-09-15 (×2): 50 ug via INTRAVENOUS

## 2020-09-15 MED ORDER — HYDROMORPHONE HCL 1 MG/ML IJ SOLN: 0.2500 mg | INTRAMUSCULAR | Status: DC | PRN

## 2020-09-15 MED ORDER — PROMETHAZINE HCL 25 MG/ML IJ SOLN: 6.2500 mg | INTRAMUSCULAR | Status: DC | PRN

## 2020-09-15 MED ORDER — POTASSIUM CHLORIDE 10 MEQ/100ML IV SOLN: 10.0000 meq | INTRAVENOUS | Status: AC

## 2020-09-15 MED ORDER — LIDOCAINE HCL (CARDIAC) PF 100 MG/5ML IV SOSY
PREFILLED_SYRINGE | INTRAVENOUS | Status: DC | PRN
  Administered 2020-09-15: 100 mg via INTRATRACHEAL

## 2020-09-15 MED ORDER — INSULIN ASPART 100 UNIT/ML ~~LOC~~ SOLN
SUBCUTANEOUS | Status: AC
  Filled 2020-09-15: qty 1

## 2020-09-15 MED ORDER — BUPIVACAINE HCL (PF) 0.25 % IJ SOLN
INTRAMUSCULAR | Status: DC | PRN
  Administered 2020-09-15: 9 mL

## 2020-09-15 MED ORDER — SODIUM CHLORIDE 0.9 % IV SOLN: INTRAVENOUS | Status: DC | PRN

## 2020-09-15 MED ORDER — MIDAZOLAM HCL 2 MG/2ML IJ SOLN
INTRAMUSCULAR | Status: DC | PRN
  Administered 2020-09-15: 2 mg via INTRAVENOUS

## 2020-09-15 NOTE — H&P (View-Only) (Signed)
[facility]  Subjective  -had I&D of left wrist by Dr. Fredna Dow overnight   Objective 98/61 84 98.9 F (37.2 C) (Oral) 16 100%  Intake/Output Summary (Last 24 hours) at 09/15/2020 0907 Last data filed at 09/15/2020 0211 Gross per 24 hour  Intake 1000 ml  Output 50 ml  Net 950 ml        Laboratory Lab Results: Recent Labs    09/14/20 0204 09/14/20 2303 09/15/20 0424  WBC 26.3*  --  24.2*  HGB 9.5* 8.5* 8.5*  HCT 29.6* 25.0* 27.7*  PLT 191  --  213   BMET Recent Labs    09/14/20 0204 09/14/20 2303 09/15/20 0424  NA 130* 133* 128*  K 4.1 2.7* 3.6  CL 90* 102 92*  CO2 28  --  22  GLUCOSE 241* 303* 327*  BUN 32* 30* 46*  CREATININE 5.04* 3.90* 6.66*  CALCIUM 9.3  --  8.8*    COAG Lab Results  Component Value Date   INR 1.2 07/22/2020   INR 1.0 05/30/2020   INR 1.2 04/24/2019   No results found for: PTT  Assessment/Planning:  Discussed plan for left below-knee amputation tomorrow.  I agree with Dr. Trula Slade I do not feel TMA or any other partial foot amputation is an option.  I did discuss with Dr. Sharol Given with orthopedic surgery who reviewed the pictures as well and he agrees.  I think his best option is below-knee amputation which he is now in agreement for.  Surgery scheduled for tomorrow.  Please keep n.p.o. after midnight.  Consent ordered in chart.  Marty Heck 09/15/2020 9:07 AM --

## 2020-09-15 NOTE — Progress Notes (Signed)
[facility]  Subjective  -had I&D of left wrist by Dr. Fredna Dow overnight   Objective 98/61 84 98.9 F (37.2 C) (Oral) 16 100%  Intake/Output Summary (Last 24 hours) at 09/15/2020 0907 Last data filed at 09/15/2020 0211 Gross per 24 hour  Intake 1000 ml  Output 50 ml  Net 950 ml        Laboratory Lab Results: Recent Labs    09/14/20 0204 09/14/20 2303 09/15/20 0424  WBC 26.3*  --  24.2*  HGB 9.5* 8.5* 8.5*  HCT 29.6* 25.0* 27.7*  PLT 191  --  213   BMET Recent Labs    09/14/20 0204 09/14/20 2303 09/15/20 0424  NA 130* 133* 128*  K 4.1 2.7* 3.6  CL 90* 102 92*  CO2 28  --  22  GLUCOSE 241* 303* 327*  BUN 32* 30* 46*  CREATININE 5.04* 3.90* 6.66*  CALCIUM 9.3  --  8.8*    COAG Lab Results  Component Value Date   INR 1.2 07/22/2020   INR 1.0 05/30/2020   INR 1.2 04/24/2019   No results found for: PTT  Assessment/Planning:  Discussed plan for left below-knee amputation tomorrow.  I agree with Dr. Trula Slade I do not feel TMA or any other partial foot amputation is an option.  I did discuss with Dr. Sharol Given with orthopedic surgery who reviewed the pictures as well and he agrees.  I think his best option is below-knee amputation which he is now in agreement for.  Surgery scheduled for tomorrow.  Please keep n.p.o. after midnight.  Consent ordered in chart.  Marty Heck 09/15/2020 9:07 AM --

## 2020-09-15 NOTE — Plan of Care (Signed)
  Problem: Clinical Measurements: Goal: Ability to maintain clinical measurements within normal limits will improve Outcome: Progressing   Problem: Clinical Measurements: Goal: Will remain free from infection Outcome: Progressing   Problem: Activity: Goal: Risk for activity intolerance will decrease Outcome: Progressing   Problem: Coping: Goal: Level of anxiety will decrease Outcome: Progressing   Problem: Nutrition: Goal: Adequate nutrition will be maintained Outcome: Progressing   Problem: Safety: Goal: Ability to remain free from injury will improve Outcome: Progressing   Problem: Pain Managment: Goal: General experience of comfort will improve Outcome: Progressing

## 2020-09-15 NOTE — Progress Notes (Signed)
2348 Patient brought back up from PACU and the procedure is pushed back to 1 am. Patient admitted to eating something at 5pm and blood work needed to be done before the procedure can take place.   0045 Patient going down to pre op via bed. Fingerstick blood sugar taken per doctor order and resulted 383.

## 2020-09-15 NOTE — Progress Notes (Addendum)
[facility] Progress Note   Subjective:   Patient seen and examined at bedside post procedure. Tearful and overwhelmed by upcoming surgery.  Consoled.  Denies CP, SOB, weakness, dizziness and confusion.   Objective Vitals:   09/15/20 0259 09/15/20 0349 09/15/20 0503 09/15/20 0551  BP: 96/61 106/62 (!) 93/55 98/61  Pulse: 88  87 84  Resp: 12 12 16 16   Temp:  100 F (37.8 C) 98.9 F (37.2 C) 98.9 F (37.2 C)  TempSrc:  Oral Oral Oral  SpO2: 100% 96% 99% 100%  Weight:      Height:       Physical Exam General:WDWN, tearful male in NAD Heart:RRR Lungs:CTAB Abdomen:soft, NTND Extremities:no LE edema, L foot and L wrist wrapped Dialysis Access: [facility]   Filed Weights   09/11/20 1435 09/13/20 0839 09/13/20 1140  Weight: 69.4 kg 69.4 kg 68 kg    Intake/Output Summary (Last 24 hours) at 09/15/2020 0821 Last data filed at 09/15/2020 0211 Gross per 24 hour  Intake 1000 ml  Output 50 ml  Net 950 ml    Additional Objective Labs: Basic Metabolic Panel: Recent Labs  Lab 09/13/20 0123 09/14/20 0204 09/14/20 2303 09/15/20 0424  NA 127* 130* 133* 128*  K 4.1 4.1 2.7* 3.6  CL 87* 90* 102 92*  CO2 25 28  --  22  GLUCOSE 177* 241* 303* 327*  BUN 45* 32* 30* 46*  CREATININE 6.99* 5.04* 3.90* 6.66*  CALCIUM 9.1 9.3  --  8.8*  PHOS 5.4* 4.7*  --  4.8*   Liver Function Tests: Recent Labs  Lab 09/13/20 0123 09/14/20 0204 09/15/20 0424  AST 17 15 14*  ALT 13 13 14   ALKPHOS 164* 153* 150*  BILITOT 1.1 1.1 0.4  PROT 6.8 6.4* 6.0*  ALBUMIN 2.0* 1.7* 1.6*   CBC: Recent Labs  Lab 09/11/20 0517 09/12/20 0330 09/13/20 0123 09/14/20 0204 09/14/20 2303 09/15/20 0424  WBC 29.9* 23.1* 26.7* 26.3*  --  24.2*  NEUTROABS  --  20.7* 22.9* 20.0*  --  20.3*  HGB 10.2* 9.9* 9.7* 9.5* 8.5* 8.5*  HCT 33.4* 32.2* 29.0* 29.6* 25.0* 27.7*  MCV 92.3 92.0 88.1 89.4  --  92.0  PLT 191 204 201 191  --  213   Blood Culture    Component Value Date/Time   SDES WOUND  LEFT WRIST 09/15/2020 0126   SPECREQUEST PT ON VANCOMYCIN 09/15/2020 0126   CULT PENDING 09/15/2020 0126   REPTSTATUS PENDING 09/15/2020 0126   CBG: Recent Labs  Lab 09/14/20 1656 09/15/20 0037 09/15/20 0131 09/15/20 0306 09/15/20 0647  GLUCAP 364* 383* 375* 364* 257*   Iron Studies:  Recent Labs    09/13/20 0123  IRON 42*  TIBC 125*  FERRITIN 3,890*   Lab Results  Component Value Date   INR 1.2 07/22/2020   INR 1.0 05/30/2020   INR 1.2 04/24/2019   Studies/Results: DG Wrist Complete Left  Result Date: 09/13/2020 CLINICAL DATA:  Possible osteomyelitis of left wrist. EXAM: LEFT WRIST - COMPLETE 3+ VIEW COMPARISON:  None. FINDINGS: There is no evidence of fracture or dislocation. There is no evidence of arthropathy or other focal bone abnormality. Vascular calcifications are noted. No definite lytic destruction is seen to suggest osteomyelitis. IMPRESSION: No definite abnormality seen in the left wrist. No definite evidence of osteomyelitis. Electronically Signed   By: Marijo Conception M.D.   On: 09/13/2020 15:23   MR WRIST LEFT WO CONTRAST  Result Date: 09/14/2020 CLINICAL DATA:  Skin  ulceration on the volar aspect the left wrist. EXAM: MR OF THE LEFT WRIST WITHOUT CONTRAST TECHNIQUE: Multiplanar, multisequence MR imaging of the left wrist was performed. No intravenous contrast was administered. COMPARISON:  Plain films left wrist 09/13/2020. FINDINGS: Bones/Joint/Cartilage There is no marrow edema to suggest osteomyelitis. No fracture, stress change or worrisome lesion. No joint effusion. No evidence of arthropathy. Ligaments Intact. Muscles and Tendons Mild, ill-defined edema and mild atrophy are seen in the musculature of the hypothenar eminence. Musculature is otherwise negative. No evidence of tenosynovitis. Soft tissues Skin wound is seen on the volar aspect of the wrist. Deep to the wound, there is fluid measuring approximately 0.7 cm AP by 3.2 cm craniocaudal by 1.6 cm  transverse. A small amount of fluid in the pisotriquetral recess is incidentally noted. IMPRESSION: Skin ulceration on the volar aspect of the wrist with underlying somewhat ill-defined fluid compatible with phlegmon or early abscess. Negative for osteomyelitis or septic joint. Mild edema in the musculature of the hypothenar eminence could be due to myositis or early atrophy. Electronically Signed   By: Inge Rise M.D.   On: 09/14/2020 10:27    Medications: . ceFEPime (MAXIPIME) IV 1 g (09/14/20 1057)  . vancomycin Stopped (09/13/20 1231)   . (feeding supplement) PROSource Plus  30 mL Oral TID BM  . calcitRIOL  2.5 mcg Oral Q M,W,F-HD  . Chlorhexidine Gluconate Cloth  6 each Topical Q0600  . cinacalcet  60 mg Oral Q breakfast  . darbepoetin (ARANESP) injection - DIALYSIS  25 mcg Intravenous Q Fri-HD  . heparin  5,000 Units Subcutaneous Q8H  . insulin aspart      . insulin aspart  0-5 Units Subcutaneous QHS  . insulin aspart  0-6 Units Subcutaneous TID WC  . insulin glargine  6 Units Subcutaneous QHS  . multivitamin  1 tablet Oral Daily  . sevelamer carbonate  4,000 mg Oral TID WC  . sodium hypochlorite  1 application Irrigation Daily    Dialysis Orders: [facility] on MWFon 4 hours 15 minutes. [id number] kgHD Bath2K, 2 calcium bath, Heparin5000 units initial then 5-mid  Calcitriol 3.0 MCG p.o. q. Dialysis no ESA  Assessment/Plan: 1.ESRD -HD MWF. K 3.6. Next HD on 12/20.OPVVShad scheduled forR BC AVF12/28/2021. 2. Left footwound withosteomyelitis- onIV vancomycin and Zosyn per admit and Dr. March Rummage podiatry. Now has agreed to amputation.  Per VVS & ortho BKA is best option and TMA not possible.  Tentative plan for surgery on Monday as long as patient in agreement.  3. L wrist lesion - s/p I&D.  No gross purulence. Removal of indurated SQ tissue. Culture sent to [facility]ra and specimen sent to pathology.  Wound packed.  Plan to start hydrotherapy in 3-4  days. 4. Hypertension/volume -BP low normal. Appears euvolemic on exam. UF as tolerated. .  5. Anemiaof ESRD-Hgb8.5. Aranesp 3mcg given 12/17, will increase dose next week to 8mcg qwk d/t planned surgery and downward trend. Ferritin 3890, no FE. 6. Metabolic bone disease -Ca and phos at goal. Continue p.o. calcitriolon HD,Renvela and sensipar 7. Nutrition -carb modified renal protein supplementAlbumin 1.7 add protein supplement 8. Diabetes mellitus type1-per admit   Jen Mow, PA-C [facility] 09/15/2020,8:21 AM  LOS: 4 days

## 2020-09-15 NOTE — Progress Notes (Signed)
[facility] PROGRESS NOTE    [name]  ZSW:[id number] DOB: Jan 05, 1981 DOA: 09/10/2020 PCP: Shane Perna, NP      Brief Narrative:  [name] is a 39 y.o. M with ESRD, DM, HTN, and left foot DFU who presented with left hand pain as well as erythema, swelling, and warmth as well as foul drainage of the foot.     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 Fredna Dow 12/19 -Follow-up culture data  ESRD -Continue Aranesp, cinacalcet, calcitriol, sevelamer -Consult nephrology, appreciate cares  Diabetes -Continue Lantus, sliding scale corrections  Hyponatremia  Anemia of chronic renal insufficiency        Disposition: Status is: Inpatient  Remains inpatient appropriate because:IV treatments appropriate due to intensity of illness or inability to take PO   Dispo: The patient is from: Home              Anticipated d/c is to: Home              Anticipated d/c date is: 3 days              Patient currently is not medically stable to d/c.              MDM: The below labs and imaging reports were reviewed and summarized above.  Medication management as above.   DVT prophylaxis: SCD's Start: 09/15/20 0359 heparin injection 5,000 Units Start: 09/11/20 0600  Code Status: Full code Family Communication: Mother by phone           Subjective: No headache, fever, chest pain, abdominal pain.  The left hand had surgery feels okay.  No swelling.  No dyspnea.  Objective: Vitals:   09/15/20 0259 09/15/20 0349 09/15/20 0503 09/15/20 0551  BP: 96/61 106/62 (!) 93/55 98/61  Pulse: 88  87 84  Resp: 12 12 16 16   Temp:  100 F (37.8 C) 98.9 F (37.2 C) 98.9 F (37.2 C)  TempSrc:  Oral Oral Oral  SpO2: 100% 96% 99% 100%  Weight:      Height:         Intake/Output Summary (Last 24 hours) at 09/15/2020 0802 Last data filed at 09/15/2020 0211 Gross per 24 hour  Intake 1000 ml  Output 50 ml  Net 950 ml   Filed Weights   09/11/20 1435 09/13/20 0839 09/13/20 1140  Weight: 69.4 kg 69.4 kg 68 kg    Examination: General appearance: Thin adult male, alert and in no acute distress.   HEENT: Anicteric, conjunctiva pink, lids and lashes normal. No nasal deformity, discharge, epistaxis.  Lips moist, dentition seems normal.,  Oropharynx moist, no oral lesions.   Skin: Warm and dry.  There are numerous hypopigmented papules over the whole body, unremarkable. Cardiac: RRR, nl S1-S2, no murmurs appreciated.  Capillary refill is brisk.  JVP normal.  No LE edema.   Respiratory: Normal respiratory rate and rhythm.  CTAB without rales or wheezes. Abdomen: Abdomen soft.  No TTP or guarding. No ascites, distension, hepatosplenomegaly.   MSK: The left foot has a missing great toe, it is also wrapped.  The left hand is wrapped.  No other joint deformities or effusions. Neuro: Awake and alert.  EOMI, moves all extremities. Speech fluent.    Psych: Sensorium intact and responding to questions, attention normal. Affect normal.  Judgment and insight appear mildly impaired.    Data Reviewed: I have personally reviewed following labs and imaging studies:  CBC: Recent Labs  Lab 09/10/20 1655 09/11/20 0517 09/12/20 0330 09/13/20 0123 09/14/20 0204 09/14/20 2303 09/15/20 0424  WBC 31.0* 29.9* 23.1* 26.7* 26.3*  --  24.2*  NEUTROABS 28.8*  --  20.7* 22.9* 20.0*  --  20.3*  HGB 10.8* 10.2* 9.9* 9.7* 9.5* 8.5* 8.5*  HCT 36.1* 33.4* 32.2* 29.0* 29.6* 25.0* 27.7*  MCV 93.8 92.3 92.0 88.1 89.4  --  92.0  PLT 203 191 204 201 191  --  007   Basic Metabolic Panel: Recent Labs  Lab 09/11/20 1453 09/12/20 0330 09/13/20 0123 09/14/20 0204 09/14/20 2303 09/15/20 0424  NA 129* 133* 127* 130* 133* 128*  K 5.4* 3.6 4.1 4.1 2.7* 3.6  CL 89* 89* 87* 90*  102 92*  CO2 16* 22 25 28   --  22  GLUCOSE 380* 189* 177* 241* 303* 327*  BUN 59* 31* 45* 32* 30* 46*  CREATININE 9.90* 6.07* 6.99* 5.04* 3.90* 6.66*  CALCIUM 8.5* 8.5* 9.1 9.3  --  8.8*  MG 2.5* 2.4 2.3 2.3  --  2.3  PHOS 7.8* 7.2* 5.4* 4.7*  --  4.8*   GFR: Estimated Creatinine Clearance: 13.8 mL/min (A) (by C-G formula based on SCr of 6.66 mg/dL (H)). Liver Function Tests: Recent Labs  Lab 09/11/20 1453 09/12/20 0330 09/13/20 0123 09/14/20 0204 09/15/20 0424  AST 15 17 17 15  14*  ALT 12 12 13 13 14   ALKPHOS 171* 179* 164* 153* 150*  BILITOT 2.6* 1.6* 1.1 1.1 0.4  PROT 6.5 6.8 6.8 6.4* 6.0*  ALBUMIN 2.1* 2.0* 2.0* 1.7* 1.6*   No results for input(s): LIPASE, AMYLASE in the last 168 hours. No results for input(s): AMMONIA in the last 168 hours. Coagulation Profile: No results for input(s): INR, PROTIME in the last 168 hours. Cardiac Enzymes: No results for input(s): CKTOTAL, CKMB, CKMBINDEX, TROPONINI in the last 168 hours. BNP (last 3 results) No results for input(s): PROBNP in the last 8760 hours. HbA1C: No results for input(s): HGBA1C in the last 72 hours. CBG: Recent Labs  Lab 09/14/20 1656 09/15/20 0037 09/15/20 0131 09/15/20 0306 09/15/20 0647  GLUCAP 364* 383* 375* 364* 257*   Lipid Profile: No results for input(s): CHOL, HDL, LDLCALC, TRIG, CHOLHDL, LDLDIRECT in the last 72 hours. Thyroid Function Tests: No results for input(s): TSH, T4TOTAL, FREET4, T3FREE, THYROIDAB in the last 72 hours. Anemia Panel: Recent Labs    09/13/20 0123 09/13/20 0124  VITAMINB12 1,152*  --   FOLATE  --  7.6  FERRITIN 3,890*  --   TIBC 125*  --   IRON 42*  --   RETICCTPCT  --  0.8   Urine analysis:    Component Value Date/Time   COLORURINE YELLOW 05/26/2015 1115   APPEARANCEUR CLEAR 05/26/2015 1115   APPEARANCEUR Hazy 07/30/2014 2303   LABSPEC 1.016 05/26/2015 1115   LABSPEC 1.012 07/30/2014 2303   PHURINE 8.0 05/26/2015 1115   GLUCOSEU 500 (A) 05/26/2015 1115    GLUCOSEU >=500 07/30/2014 2303   HGBUR MODERATE (A) 05/26/2015 1115   BILIRUBINUR NEGATIVE 05/26/2015 1115   BILIRUBINUR Negative 07/30/2014 2303   KETONESUR 15 (A) 05/26/2015 1115   PROTEINUR >300 (A) 05/26/2015 1115   UROBILINOGEN 0.2 05/26/2015 1115   NITRITE NEGATIVE 05/26/2015 1115   LEUKOCYTESUR NEGATIVE 05/26/2015 1115   LEUKOCYTESUR Negative 07/30/2014 2303   Sepsis Labs: @LABRCNTIP (procalcitonin:4,lacticacidven:4)  ) Recent Results (from  the past 240 hour(s))  Blood culture (routine x 2)     Status: None (Preliminary result)   Collection Time: 09/10/20  4:56 PM   Specimen: BLOOD  Result Value Ref Range Status   Specimen Description BLOOD SITE NOT SPECIFIED  Final   Special Requests   Final    BOTTLES DRAWN AEROBIC AND ANAEROBIC Blood Culture adequate volume   Culture   Final    NO GROWTH 4 DAYS Performed at [facility], [address] [place], [place] [address]    Report Status PENDING  Incomplete  Resp Panel by RT-PCR (Flu A&B, Covid) Nasopharyngeal Swab     Status: None   Collection Time: 09/11/20  4:41 AM   Specimen: Nasopharyngeal Swab; Nasopharyngeal(NP) swabs in vial transport medium  Result Value Ref Range Status   SARS Coronavirus 2 by RT PCR NEGATIVE NEGATIVE Final    Comment: (NOTE) SARS-CoV-2 target nucleic acids are NOT DETECTED.  The SARS-CoV-2 RNA is generally detectable in upper respiratory specimens during the acute phase of infection. The lowest concentration of SARS-CoV-2 viral copies this assay can detect is 138 copies/mL. A negative result does not preclude SARS-Cov-2 infection and should not be used as the sole basis for treatment or other patient management decisions. A negative result may occur with  improper specimen collection/handling, submission of specimen other than nasopharyngeal swab, presence of viral mutation(s) within the areas targeted by this assay, and inadequate number of viral copies(<138 copies/mL). A negative  result must be combined with clinical observations, patient history, and epidemiological information. The expected result is Negative.  Fact Sheet for Patients:  EntrepreneurPulse.com.au  Fact Sheet for Healthcare Providers:  IncredibleEmployment.be  This test is no t yet approved or cleared by the [place] FDA and  has been authorized for detection and/or diagnosis of SARS-CoV-2 by FDA under an Emergency Use Authorization (EUA). This EUA will remain  in effect (meaning this test can be used) for the duration of the COVID-19 declaration under Section 564(b)(1) of the Act, 21 U.S.C.section 360bbb-3(b)(1), unless the authorization is terminated  or revoked sooner.       Influenza A by PCR NEGATIVE NEGATIVE Final   Influenza B by PCR NEGATIVE NEGATIVE Final    Comment: (NOTE) The Xpert Xpress SARS-CoV-2/FLU/RSV plus assay is intended as an aid in the diagnosis of influenza from Nasopharyngeal swab specimens and should not be used as a sole basis for treatment. Nasal washings and aspirates are unacceptable for Xpert Xpress SARS-CoV-2/FLU/RSV testing.  Fact Sheet for Patients: EntrepreneurPulse.com.au  Fact Sheet for Healthcare Providers: IncredibleEmployment.be  This test is not yet approved or cleared by the [place] FDA and has been authorized for detection and/or diagnosis of SARS-CoV-2 by FDA under an Emergency Use Authorization (EUA). This EUA will remain in effect (meaning this test can be used) for the duration of the COVID-19 declaration under Section 564(b)(1) of the Act, 21 U.S.C. section 360bbb-3(b)(1), unless the authorization is terminated or revoked.  Performed at [facility], [place] [address], [place], [place] [address]   Blood culture (routine x 2)     Status: None (Preliminary result)   Collection Time: 09/13/20  1:23 AM   Specimen: BLOOD  Result Value Ref Range Status    Specimen Description BLOOD RIGHT ANTECUBITAL  Final   Special Requests   Final    BOTTLES DRAWN AEROBIC AND ANAEROBIC Blood Culture adequate volume   Culture   Final    NO GROWTH 1 DAY Performed  at [facility], [place] [address], [place],  [address]    Report Status PENDING  Incomplete         Radiology Studies: DG Wrist Complete Left  Result Date: 09/13/2020 CLINICAL DATA:  Possible osteomyelitis of left wrist. EXAM: LEFT WRIST - COMPLETE 3+ VIEW COMPARISON:  None. FINDINGS: There is no evidence of fracture or dislocation. There is no evidence of arthropathy or other focal bone abnormality. Vascular calcifications are noted. No definite lytic destruction is seen to suggest osteomyelitis. IMPRESSION: No definite abnormality seen in the left wrist. No definite evidence of osteomyelitis. Electronically Signed   By: Marijo Conception M.D.   On: 09/13/2020 15:23   MR WRIST LEFT WO CONTRAST  Result Date: 09/14/2020 CLINICAL DATA:  Skin ulceration on the volar aspect the left wrist. EXAM: MR OF THE LEFT WRIST WITHOUT CONTRAST TECHNIQUE: Multiplanar, multisequence MR imaging of the left wrist was performed. No intravenous contrast was administered. COMPARISON:  Plain films left wrist 09/13/2020. FINDINGS: Bones/Joint/Cartilage There is no marrow edema to suggest osteomyelitis. No fracture, stress change or worrisome lesion. No joint effusion. No evidence of arthropathy. Ligaments Intact. Muscles and Tendons Mild, ill-defined edema and mild atrophy are seen in the musculature of the hypothenar eminence. Musculature is otherwise negative. No evidence of tenosynovitis. Soft tissues Skin wound is seen on the volar aspect of the wrist. Deep to the wound, there is fluid measuring approximately 0.7 cm AP by 3.2 cm craniocaudal by 1.6 cm transverse. A small amount of fluid in the pisotriquetral recess is incidentally noted. IMPRESSION: Skin ulceration on the volar aspect of the wrist with underlying  somewhat ill-defined fluid compatible with phlegmon or early abscess. Negative for osteomyelitis or septic joint. Mild edema in the musculature of the hypothenar eminence could be due to myositis or early atrophy. Electronically Signed   By: Inge Rise M.D.   On: 09/14/2020 10:27        Scheduled Meds: . (feeding supplement) PROSource Plus  30 mL Oral TID BM  . calcitRIOL  2.5 mcg Oral Q M,W,F-HD  . Chlorhexidine Gluconate Cloth  6 each Topical Q0600  . cinacalcet  60 mg Oral Q breakfast  . darbepoetin (ARANESP) injection - DIALYSIS  25 mcg Intravenous Q Fri-HD  . fentaNYL      . heparin  5,000 Units Subcutaneous Q8H  . insulin aspart      . insulin aspart  0-5 Units Subcutaneous QHS  . insulin aspart  0-6 Units Subcutaneous TID WC  . insulin glargine  6 Units Subcutaneous QHS  . multivitamin  1 tablet Oral Daily  . sevelamer carbonate  4,000 mg Oral TID WC  . sodium hypochlorite  1 application Irrigation Daily   Continuous Infusions: . ceFEPime (MAXIPIME) IV 1 g (09/14/20 1057)  . vancomycin Stopped (09/13/20 1231)     LOS: 4 days    Time spent: 25 minutes    Edwin Dada, MD [facility] 09/15/2020, 8:02 AM     Please page though [place] or Epic secure chat:  For [employer], Adult nurse

## 2020-09-15 NOTE — Anesthesia Procedure Notes (Signed)
Procedure Name: Intubation Date/Time: 09/15/2020 1:05 AM Performed by: Josephine Igo, CRNA Pre-anesthesia Checklist: Patient identified, Emergency Drugs available, Suction available and Patient being monitored Patient Re-evaluated:Patient Re-evaluated prior to induction Oxygen Delivery Method: Circle system utilized Preoxygenation: Pre-oxygenation with 100% oxygen Induction Type: IV induction, Rapid sequence and Cricoid Pressure applied Laryngoscope Size: Miller and 2 Grade View: Grade I Tube type: Oral Tube size: 7.5 mm Number of attempts: 1 Airway Equipment and Method: Stylet Placement Confirmation: ETT inserted through vocal cords under direct vision,  positive ETCO2 and breath sounds checked- equal and bilateral Secured at: 24 cm Tube secured with: Tape Dental Injury: Teeth and Oropharynx as per pre-operative assessment

## 2020-09-15 NOTE — Op Note (Signed)
NAME: [name] MEDICAL RECORD NO: [id number] DATE OF BIRTH: 1981/08/06 FACILITY: [name] LOCATION: [facility] OR PHYSICIAN: Tennis Must, MD   OPERATIVE REPORT   DATE OF PROCEDURE: 09/15/20    PREOPERATIVE DIAGNOSIS:   Left wrist lesion   POSTOPERATIVE DIAGNOSIS:   Left wrist lesion   PROCEDURE:   Incision and drainage left wrist lesion   SURGEON:  Leanora Cover, M.D.   ASSISTANT: none   ANESTHESIA:  General   INTRAVENOUS FLUIDS:  Per anesthesia flow sheet.   ESTIMATED BLOOD LOSS:  Minimal.   COMPLICATIONS:  None.   SPECIMENS:   Cultures to micro; tissue to pathology   TOURNIQUET TIME:    Total Tourniquet Time Documented: Upper Arm (Left) - 40 minutes Total: Upper Arm (Left) - 40 minutes    DISPOSITION:  Stable to PACU.   INDICATIONS: 39 year old male with lesion of the left wrist.  This has been worsening over the past 2 weeks.  It is painful.  He wishes to proceed with incision and drainage. Risks, benefits and alternatives of surgery were discussed including the risks of blood loss, infection, damage to nerves, vessels, tendons, ligaments, bone for surgery, need for additional surgery, complications with wound healing, continued pain, stiffness, need for repeat incision and drainage.Marland Kitchen  He voiced understanding of these risks and elected to proceed.  OPERATIVE COURSE:  After being identified preoperatively by myself,  the patient and I agreed on the procedure and site of the procedure.  The surgical site was marked.  Surgical consent had been signed. He was given IV antibiotics as preoperative antibiotic prophylaxis. He was transferred to the operating room and placed on the operating table in supine position with the Left upper extremity on an arm board.  General anesthesia was induced by the anesthesiologist.  Left upper extremity was prepped and draped in normal sterile orthopedic fashion.  A surgical pause was performed between the surgeons, anesthesia, and operating  room staff and all were in agreement as to the patient, procedure, and site of procedure.  Tourniquet at the proximal aspect of the forearm was inflated to 250 mmHg after exsanguination of the arm with an Esmarch bandage.    The necrotic skin at the center of the lesion was removed with the scissors.  There was watery fluid with discoloration within.  Cultures were taken for aerobes and anaerobes.  Incision was made both proximal and distal including the necrotic portion of the wound.  There was indurated tissue underneath.  It was not grossly purulent.  There were some patches of what appeared to be hematoma which were removed.  The tissue was friable.  It was freed up from the vein running through the wound and from the underlying fascia and overlying skin.  Some was sent to pathology for examination.  The cavity did go deep to the fascia at the ulnar side of the cavity over the FCU tendon.  Rongeurs and curette were used to carefully remove the tissue.  Bipolar electrocautery was used to obtain hemostasis.  The area was palpated and most of the indurated area was gone.  The necrotic edges of the skin were sharply removed with a knife.  The wound was copiously irrigated with sterile saline by bulb syringe.  A suture was placed proximal and distal to decrease the wound size.  The cavity was then packed with quarter inch iodoform gauze.  It was injected with quarter percent plain Marcaine to aid in postoperative analgesia.  The wound was was dressed  with sterile 4 x 4's and ABD and wrapped with Kerlix and Ace bandage.  The tourniquet was deflated at 40 minutes.  Fingertips were pink with brisk capillary refill after deflation of tourniquet.  The operative  drapes were broken down.  The patient was awoken from anesthesia safely.  He was transferred back to the stretcher and taken to PACU in stable condition.    Leanora Cover, MD Electronically signed, 09/15/20

## 2020-09-15 NOTE — Progress Notes (Signed)
OT Cancellation Note  Patient Details Name: [name] MRN: [id number] DOB: 1981-05-23   Cancelled Treatment:    Reason Eval/Treat Not Completed: Other (comment). Planned L BKA tomorrow. Will evaluate patient after surgery.  Raffael Bugarin L Randale Carvalho 09/15/2020, 2:01 PM

## 2020-09-15 NOTE — Transfer of Care (Signed)
Immediate Anesthesia Transfer of Care Note  Patient: [name]  Procedure(s) Performed: IRRIGATION AND DEBRIDEMENT WRIST (Left )  Patient Location: PACU  Anesthesia Type:General  Level of Consciousness: sedated  Airway & Oxygen Therapy: Patient Spontanous Breathing and Patient connected to nasal cannula oxygen  Post-op Assessment: Report given to RN and Post -op Vital signs reviewed and stable  Post vital signs: Reviewed and stable  Last Vitals:  Vitals Value Taken Time  BP    Temp    Pulse    Resp 11 09/15/20 0211  SpO2    Vitals shown include unvalidated device data.  Last Pain:  Vitals:   09/14/20 2134  TempSrc:   PainSc: 10-Worst pain ever      Patients Stated Pain Goal: 0 (24/81/44 3926)  Complications: No complications documented.

## 2020-09-15 NOTE — Progress Notes (Signed)
Postop note Status post incision and drainage left wrist lesion.  No gross purulence.  There was thin watery fluid and thickened indurated subcutaneous tissues which were removed.  Cultures were sent to micro and specimen was sent to pathology.  Wound packed.  Start hydrotherapy in 3 to 4 days.

## 2020-09-15 NOTE — Progress Notes (Signed)
Patient arrived on the floor from PACU at 0341 via bed. Patient is sleeping in bed at this time. Will review post op orders and proceed with POC.

## 2020-09-16 ENCOUNTER — Encounter (HOSPITAL_COMMUNITY)
Admission: EM | Discharge status: Against Medical Advice | Payer: Self-pay | Source: Home / Self Care | Attending: Internal Medicine

## 2020-09-16 ENCOUNTER — Inpatient Hospital Stay (HOSPITAL_COMMUNITY): Payer: Medicare HMO | Admitting: Anesthesiology

## 2020-09-16 ENCOUNTER — Encounter (HOSPITAL_COMMUNITY): Payer: Self-pay | Admitting: Orthopedic Surgery

## 2020-09-16 DIAGNOSIS — M86672 Other chronic osteomyelitis, left ankle and foot: Secondary | ICD-10-CM | POA: Diagnosis not present

## 2020-09-16 HISTORY — PX: AMPUTATION: SHX166

## 2020-09-16 LAB — BASIC METABOLIC PANEL
Anion gap: 16 — ABNORMAL HIGH (ref 5–15)
BUN: 31 mg/dL — ABNORMAL HIGH (ref 6–20)
CO2: 23 mmol/L (ref 22–32)
Calcium: 8.8 mg/dL — ABNORMAL LOW (ref 8.9–10.3)
Chloride: 96 mmol/L — ABNORMAL LOW (ref 98–111)
Creatinine, Ser: 5.24 mg/dL — ABNORMAL HIGH (ref 0.61–1.24)
GFR, Estimated: 13 mL/min — ABNORMAL LOW (ref >60)
Glucose, Bld: 298 mg/dL — ABNORMAL HIGH (ref 70–99)
Potassium: 3.9 mmol/L (ref 3.5–5.1)
Sodium: 135 mmol/L (ref 135–145)

## 2020-09-16 LAB — POCT I-STAT, CHEM 8
BUN: 27 mg/dL — ABNORMAL HIGH (ref 6–20)
Calcium, Ion: 1.04 mmol/L — ABNORMAL LOW (ref 1.15–1.40)
Chloride: 100 mmol/L (ref 98–111)
Creatinine, Ser: 4.6 mg/dL — ABNORMAL HIGH (ref 0.61–1.24)
Glucose, Bld: 38 mg/dL — CL (ref 70–99)
HCT: 29 % — ABNORMAL LOW (ref 39.0–52.0)
Hemoglobin: 9.9 g/dL — ABNORMAL LOW (ref 13.0–17.0)
Potassium: 3.3 mmol/L — ABNORMAL LOW (ref 3.5–5.1)
Sodium: 134 mmol/L — ABNORMAL LOW (ref 135–145)
TCO2: 24 mmol/L (ref 22–32)

## 2020-09-16 LAB — RENAL FUNCTION PANEL
Albumin: 1.8 g/dL — ABNORMAL LOW (ref 3.5–5.0)
Anion gap: 19 — ABNORMAL HIGH (ref 5–15)
BUN: 60 mg/dL — ABNORMAL HIGH (ref 6–20)
CO2: 20 mmol/L — ABNORMAL LOW (ref 22–32)
Calcium: 9 mg/dL (ref 8.9–10.3)
Chloride: 87 mmol/L — ABNORMAL LOW (ref 98–111)
Creatinine, Ser: 7.7 mg/dL — ABNORMAL HIGH (ref 0.61–1.24)
GFR, Estimated: 8 mL/min — ABNORMAL LOW (ref >60)
Glucose, Bld: 441 mg/dL — ABNORMAL HIGH (ref 70–99)
Phosphorus: 5 mg/dL — ABNORMAL HIGH (ref 2.5–4.6)
Potassium: 4.1 mmol/L (ref 3.5–5.1)
Sodium: 126 mmol/L — ABNORMAL LOW (ref 135–145)

## 2020-09-16 LAB — MRSA PCR SCREENING: MRSA by PCR: NEGATIVE

## 2020-09-16 LAB — CBC
HCT: 29.3 % — ABNORMAL LOW (ref 39.0–52.0)
Hemoglobin: 9.3 g/dL — ABNORMAL LOW (ref 13.0–17.0)
MCH: 28.2 pg (ref 26.0–34.0)
MCHC: 31.7 g/dL (ref 30.0–36.0)
MCV: 88.8 fL (ref 80.0–100.0)
Platelets: 205 10*3/uL (ref 150–400)
RBC: 3.3 MIL/uL — ABNORMAL LOW (ref 4.22–5.81)
RDW: 15 % (ref 11.5–15.5)
WBC: 24.7 10*3/uL — ABNORMAL HIGH (ref 4.0–10.5)
nRBC: 0.1 % (ref 0.0–0.2)

## 2020-09-16 LAB — GLUCOSE, CAPILLARY
Glucose-Capillary: 375 mg/dL — ABNORMAL HIGH (ref 70–99)
Glucose-Capillary: 116 mg/dL — ABNORMAL HIGH (ref 70–99)
Glucose-Capillary: 117 mg/dL — ABNORMAL HIGH (ref 70–99)
Glucose-Capillary: 170 mg/dL — ABNORMAL HIGH (ref 70–99)

## 2020-09-16 LAB — VANCOMYCIN, TROUGH: Vancomycin Tr: 21 ug/mL (ref 15–20)

## 2020-09-16 SURGERY — AMPUTATION BELOW KNEE
Anesthesia: General | Site: Knee | Laterality: Left

## 2020-09-16 SURGERY — AMPUTATION BELOW KNEE
Anesthesia: Monitor Anesthesia Care | Site: Knee | Laterality: Left

## 2020-09-16 MED ORDER — SODIUM CHLORIDE 0.9 % IV SOLN: INTRAVENOUS | Status: DC

## 2020-09-16 MED ORDER — FENTANYL CITRATE (PF) 100 MCG/2ML IJ SOLN
INTRAMUSCULAR | Status: AC
  Filled 2020-09-16: qty 2

## 2020-09-16 MED ORDER — HEPARIN SODIUM (PORCINE) 1000 UNIT/ML IJ SOLN
INTRAMUSCULAR | Status: AC
  Administered 2020-09-16: 3800 [IU] via INTRAVENOUS
  Filled 2020-09-16: qty 4

## 2020-09-16 MED ORDER — HYDROMORPHONE HCL 1 MG/ML IJ SOLN
1.0000 mg | INTRAMUSCULAR | Status: DC | PRN
  Administered 2020-09-16 – 2020-10-02 (×59): 1 mg via INTRAVENOUS
  Filled 2020-09-16 (×65): qty 1

## 2020-09-16 MED ORDER — CHLORHEXIDINE GLUCONATE 0.12 % MT SOLN: 15.0000 mL | Freq: Once | OROMUCOSAL | Status: AC

## 2020-09-16 MED ORDER — OXYCODONE HCL 5 MG PO TABS
5.0000 mg | ORAL_TABLET | ORAL | Status: DC | PRN
  Administered 2020-09-16 – 2020-09-29 (×32): 5 mg via ORAL
  Filled 2020-09-16 (×33): qty 1

## 2020-09-16 MED ORDER — PHENYLEPHRINE HCL-NACL 10-0.9 MG/250ML-% IV SOLN
INTRAVENOUS | Status: DC | PRN
  Administered 2020-09-16: 30 ug/min via INTRAVENOUS

## 2020-09-16 MED ORDER — HYDROMORPHONE HCL 1 MG/ML IJ SOLN: 0.2500 mg | INTRAMUSCULAR | Status: DC | PRN

## 2020-09-16 MED ORDER — PROPOFOL 500 MG/50ML IV EMUL
INTRAVENOUS | Status: DC | PRN
  Administered 2020-09-16: 50 ug/kg/min via INTRAVENOUS

## 2020-09-16 MED ORDER — DEXMEDETOMIDINE (PRECEDEX) IN NS 20 MCG/5ML (4 MCG/ML) IV SYRINGE
PREFILLED_SYRINGE | INTRAVENOUS | Status: DC | PRN
  Administered 2020-09-16: 4 ug via INTRAVENOUS
  Administered 2020-09-16 (×2): 8 ug via INTRAVENOUS

## 2020-09-16 MED ORDER — ONDANSETRON HCL 4 MG/2ML IJ SOLN
INTRAMUSCULAR | Status: DC | PRN
  Administered 2020-09-16: 4 mg via INTRAVENOUS

## 2020-09-16 MED ORDER — ALBUMIN HUMAN 5 % IV SOLN: INTRAVENOUS | Status: DC | PRN

## 2020-09-16 MED ORDER — PROPOFOL 10 MG/ML IV BOLUS
INTRAVENOUS | Status: DC | PRN
  Administered 2020-09-16: 30 mg via INTRAVENOUS
  Administered 2020-09-16: 50 mg via INTRAVENOUS
  Administered 2020-09-16: 20 mg via INTRAVENOUS

## 2020-09-16 MED ORDER — FENTANYL CITRATE (PF) 250 MCG/5ML IJ SOLN
INTRAMUSCULAR | Status: DC | PRN
  Administered 2020-09-16: 100 ug via INTRAVENOUS
  Administered 2020-09-16 (×2): 50 ug via INTRAVENOUS

## 2020-09-16 MED ORDER — BUPIVACAINE-EPINEPHRINE (PF) 0.5% -1:200000 IJ SOLN
INTRAMUSCULAR | Status: DC | PRN
  Administered 2020-09-16: 20 mL via PERINEURAL
  Administered 2020-09-16: 15 mL via PERINEURAL

## 2020-09-16 MED ORDER — MIDAZOLAM HCL 2 MG/2ML IJ SOLN
INTRAMUSCULAR | Status: AC
  Filled 2020-09-16: qty 2

## 2020-09-16 MED ORDER — MIDAZOLAM HCL 2 MG/2ML IJ SOLN
INTRAMUSCULAR | Status: DC | PRN
  Administered 2020-09-16: 2 mg via INTRAVENOUS

## 2020-09-16 MED ORDER — HEPARIN SODIUM (PORCINE) 1000 UNIT/ML IJ SOLN
1000.0000 [IU] | INTRAMUSCULAR | Status: DC
  Filled 2020-09-16 (×2): qty 1

## 2020-09-16 MED ORDER — DEXTROSE 50 % IV SOLN
INTRAVENOUS | Status: AC
  Filled 2020-09-16: qty 50

## 2020-09-16 MED ORDER — VANCOMYCIN HCL IN DEXTROSE 750-5 MG/150ML-% IV SOLN
INTRAVENOUS | Status: AC
  Administered 2020-09-16: 750 mg via INTRAVENOUS
  Filled 2020-09-16: qty 150

## 2020-09-16 MED ORDER — ALBUMIN HUMAN 5 % IV SOLN
INTRAVENOUS | Status: AC
  Filled 2020-09-16: qty 250

## 2020-09-16 MED ORDER — BUPIVACAINE LIPOSOME 1.3 % IJ SUSP
INTRAMUSCULAR | Status: DC | PRN
  Administered 2020-09-16: 10 mL via PERINEURAL

## 2020-09-16 MED ORDER — ACETAMINOPHEN 500 MG PO TABS: 1000.0000 mg | ORAL_TABLET | Freq: Once | ORAL | Status: AC

## 2020-09-16 MED ORDER — PROPOFOL 10 MG/ML IV BOLUS
INTRAVENOUS | Status: AC
  Filled 2020-09-16: qty 20

## 2020-09-16 MED ORDER — CHLORHEXIDINE GLUCONATE 0.12 % MT SOLN
OROMUCOSAL | Status: AC
  Administered 2020-09-16: 14:00:00 15 mL via OROMUCOSAL
  Filled 2020-09-16: qty 15

## 2020-09-16 MED ORDER — ALBUMIN HUMAN 5 % IV SOLN
12.5000 g | Freq: Once | INTRAVENOUS | Status: AC
  Administered 2020-09-16: 12.5 g via INTRAVENOUS

## 2020-09-16 MED ORDER — ACETAMINOPHEN 500 MG PO TABS
ORAL_TABLET | ORAL | Status: AC
  Administered 2020-09-16: 14:00:00 1000 mg via ORAL
  Filled 2020-09-16: qty 2

## 2020-09-16 MED ORDER — FENTANYL CITRATE (PF) 250 MCG/5ML IJ SOLN
INTRAMUSCULAR | Status: AC
  Filled 2020-09-16: qty 5

## 2020-09-16 MED ORDER — PHENYLEPHRINE HCL-NACL 10-0.9 MG/250ML-% IV SOLN
INTRAVENOUS | Status: AC
  Filled 2020-09-16: qty 250

## 2020-09-16 MED ORDER — ORAL CARE MOUTH RINSE: 15.0000 mL | Freq: Once | OROMUCOSAL | Status: AC

## 2020-09-16 SURGICAL SUPPLY — 42 items
BANDAGE ESMARK 6X9 LF (GAUZE/BANDAGES/DRESSINGS) IMPLANT
BLADE SAW GIGLI 510 (BLADE) ×2 IMPLANT
BNDG CMPR 9X6 STRL LF SNTH (GAUZE/BANDAGES/DRESSINGS)
BNDG COHESIVE 6X5 TAN STRL LF (GAUZE/BANDAGES/DRESSINGS) ×2 IMPLANT
BNDG ELASTIC 4X5.8 VLCR STR LF (GAUZE/BANDAGES/DRESSINGS) ×2 IMPLANT
BNDG ELASTIC 6X5.8 VLCR STR LF (GAUZE/BANDAGES/DRESSINGS) IMPLANT
BNDG ESMARK 6X9 LF (GAUZE/BANDAGES/DRESSINGS)
BNDG GAUZE ELAST 4 BULKY (GAUZE/BANDAGES/DRESSINGS) ×4 IMPLANT
CANISTER SUCT 3000ML PPV (MISCELLANEOUS) ×2 IMPLANT
CLIP LIGATING EXTRA MED SLVR (CLIP) ×2 IMPLANT
CLIP LIGATING EXTRA SM BLUE (MISCELLANEOUS) ×2 IMPLANT
COVER SURGICAL LIGHT HANDLE (MISCELLANEOUS) ×2 IMPLANT
COVER WAND RF STERILE (DRAPES) ×2 IMPLANT
DRAPE HALF SHEET 40X57 (DRAPES) ×2 IMPLANT
DRAPE ORTHO SPLIT 77X108 STRL (DRAPES) ×4
DRAPE SURG ORHT 6 SPLT 77X108 (DRAPES) ×2 IMPLANT
DRSG XEROFORM 1X8 (GAUZE/BANDAGES/DRESSINGS) ×2 IMPLANT
ELECT REM PT RETURN 9FT ADLT (ELECTROSURGICAL) ×2
ELECTRODE REM PT RTRN 9FT ADLT (ELECTROSURGICAL) ×1 IMPLANT
EVACUATOR SILICONE 100CC (DRAIN) IMPLANT
GAUZE SPONGE 4X4 12PLY STRL (GAUZE/BANDAGES/DRESSINGS) ×2 IMPLANT
GAUZE XEROFORM 5X9 LF (GAUZE/BANDAGES/DRESSINGS) ×2 IMPLANT
GLOVE SS BIOGEL STRL SZ 7.5 (GLOVE) ×1 IMPLANT
GLOVE SUPERSENSE BIOGEL SZ 7.5 (GLOVE) ×1
GLOVE SURG UNDER POLY LF SZ7 (GLOVE) ×2 IMPLANT
GOWN STRL REUS W/ TWL LRG LVL3 (GOWN DISPOSABLE) ×2 IMPLANT
GOWN STRL REUS W/TWL LRG LVL3 (GOWN DISPOSABLE) ×4
KIT BASIN OR (CUSTOM PROCEDURE TRAY) ×2 IMPLANT
KIT TURNOVER KIT B (KITS) ×2 IMPLANT
NS IRRIG 1000ML POUR BTL (IV SOLUTION) ×2 IMPLANT
PACK GENERAL/GYN (CUSTOM PROCEDURE TRAY) ×2 IMPLANT
PAD ARMBOARD 7.5X6 YLW CONV (MISCELLANEOUS) ×2 IMPLANT
PADDING CAST COTTON 6X4 STRL (CAST SUPPLIES) IMPLANT
STAPLER VISISTAT 35W (STAPLE) ×2 IMPLANT
STOCKINETTE IMPERVIOUS LG (DRAPES) ×2 IMPLANT
SUT ETHILON 3 0 PS 1 (SUTURE) IMPLANT
SUT VIC AB 0 CT1 18XCR BRD 8 (SUTURE) ×2 IMPLANT
SUT VIC AB 0 CT1 8-18 (SUTURE) ×4
SUT VICRYL AB 2 0 TIES (SUTURE) ×2 IMPLANT
TOWEL GREEN STERILE (TOWEL DISPOSABLE) ×2 IMPLANT
UNDERPAD 30X36 HEAVY ABSORB (UNDERPADS AND DIAPERS) ×2 IMPLANT
WATER STERILE IRR 1000ML POUR (IV SOLUTION) IMPLANT

## 2020-09-16 NOTE — Op Note (Signed)
    OPERATIVE REPORT  DATE OF SURGERY: 09/16/2020  PATIENT: [name], 39 y.o. male MRN: [id number]  DOB: Feb 23, 1981  PRE-OPERATIVE DIAGNOSIS: Gangrene left foot  POST-OPERATIVE DIAGNOSIS:  Same  PROCEDURE: Left below-knee amputation  SURGEON:  Curt Jews, M.D.  PHYSICIAN ASSISTANT: Arlee Muslim, PA-C  The assistant was needed for exposure and to expedite the case  ANESTHESIA: Leg block with sedation  EBL: per anesthesia record  Total I/O In: 250 [IV Piggyback:250] Out: 2100 [Other:2000; Blood:100]  BLOOD ADMINISTERED: none  DRAINS: none  SPECIMEN: none  COUNTS CORRECT:  YES  PATIENT DISPOSITION:  PACU - hemodynamically stable  PROCEDURE DETAILS: The patient was taken operating placed supine additionally the area of the left leg was prepped draped usual sterile fashion incision was made several fingerbreadths below the tibial prominence and carried down through the anterior muscle bodies with electrocautery.  On entering the more lateral aspect frank pus was encountered and it was apparent that there was a large abscess and some dead muscle as well.  We will medial incision was continued and the gastrocnemius was left intact with posterior base muscle flap.  There was no evidence of necrotic debris posteriorly.  The wound and saphenous vein was ligated with 0 Vicryl ties and divided.  The muscle was divided with electrocautery.  The neurovascular bundle was ligated with 0 Vicryl ties and divided.  Periosteum was elevated on the tibia and fibula.  Anterior tibial neurovascular bundle was ligated with 0 Vicryl ties and divided.  The fibula was divided with bone shears and the tibia was divided with a Gigli saw.  The bone edges were smoothed with a bone rasp.  Due to the area of necrosis aneurysm incision was made from the lateral aspect and the leading a portion of skin over the dead muscle.  Muscle was debrided back to more healthy tissue.  The pus was sent for aerobic and  anaerobic culture.  Wound was copiously irrigated with saline.  The medial two thirds of the amputation was closed by closing the anterior fascia to the posterior fascia and the skin was closed with skin staples.  The area over the lateral aspect was left open and was packed with Betadine soaked 4 x 4's.  A Curlex and Ace were placed over this and the patient was transferred to the recovery room in stable condition   Rosetta Posner, M.D., [facility] [place] 09/16/2020 4:52 PM

## 2020-09-16 NOTE — Progress Notes (Signed)
Okay to give Vancomycin with Vanc level of 21 per pharmacy.

## 2020-09-16 NOTE — Progress Notes (Signed)
Pharmacy Antibiotic Note  [name] is a 39 y.o. male admitted on 09/10/2020 with L foot wound infection. X ray findings compatible with osteomyelitis. Pharmacy has been consulted for Vancomycin and Cefepime dosing.. Pt is ESRD on HD MWF. WBC 26.3. Patient afebrile. Vascular, podiatry and ID recommending BKA; patient initially refused but now agreeable. Likely will undergo this week.   Will continue cefepime dosing; could consider adjusting to 2g with HD if continued later this week. Vancomycin random level prior to HD this AM  was 21 mcg/ml (in range 15-25 mcg/ml).   Plan: Continue vancomycin 750mg  with HD MWF  Continue cefepime 1g q24h Monitor HD schedule, podiatry plans, cultures, and clinical progression  Height: 5\' 7"  (170.2 cm) Weight: 70 kg (154 lb 5.2 oz) IBW/kg (Calculated) : 66.1  Temp (24hrs), Avg:98 F (36.7 C), Min:97.7 F (36.5 C), Max:98.2 F (36.8 C)  Recent Labs  Lab 09/10/20 1656 09/11/20 0303 09/11/20 0517 09/12/20 0330 09/13/20 0123 09/14/20 0204 09/14/20 2303 09/15/20 0424 09/16/20 0141 09/16/20 0216 09/16/20 0807  WBC  --   --    < > 23.1* 26.7* 26.3*  --  24.2*  --  24.7*  --   CREATININE  --   --    < > 6.07* 6.99* 5.04* 3.90* 6.66*  --   --  7.70*  LATICACIDVEN 2.0* 1.7  --   --   --   --   --   --   --   --   --   VANCOTROUGH  --   --   --   --   --   --   --   --  21*  --   --    < > = values in this interval not displayed.    Estimated Creatinine Clearance: 11.9 mL/min (A) (by C-G formula based on SCr of 7.7 mg/dL (H)).    No Known Allergies  Antimicrobials this admission: Zosyn x1 12/15  Vancomycin 12/15 >>  Cefepime 12/15 >>    Microbiology results: 12/14 bcx: ngtd  Remell Giaimo A. Levada Dy, PharmD, BCPS, FNKF Clinical Pharmacist [place] Please utilize [facility] for appropriate phone number to reach the unit pharmacist ([place])     09/16/2020 8:52 AM

## 2020-09-16 NOTE — Progress Notes (Signed)
CRITICAL VALUE ALERT  Critical Value: Glucose 38 via I-Stat 1431  Date & Time Notied:  09/16/2020   Provider Notified: Dr. Ola Spurr, E   Orders Received/Actions taken: CRNA at bedside and treating.

## 2020-09-16 NOTE — Anesthesia Procedure Notes (Signed)
Anesthesia Regional Block: Popliteal block   Pre-Anesthetic Checklist: ,, timeout performed, Correct Patient, Correct Site, Correct Laterality, Correct Procedure, Correct Position, site marked, Risks and benefits discussed, pre-op evaluation,  At surgeon's request and post-op pain management  Laterality: Left  Prep: Maximum Sterile Barrier Precautions used, chloraprep       Needles:  Injection technique: Single-shot  Needle Type: Echogenic Stimulator Needle     Needle Length: 9cm  Needle Gauge: 21     Additional Needles:   Procedures:,,,, ultrasound used (permanent image in chart),,,,  Narrative:  Start time: 09/16/2020 3:00 PM End time: 09/16/2020 3:10 PM Injection made incrementally with aspirations every 5 mL. Anesthesiologist: Roderic Palau, MD  Additional Notes: 2% Lidocaine skin wheel. Adductor canal block with 15cc of 0.5% Bupivicaine plain w/ 1:200k epi.

## 2020-09-16 NOTE — Progress Notes (Signed)
[facility] Progress Note   Subjective: Seen on HD, pleasant and talkative. For L BKA later today  HR 120s. Getting EKG.   Objective Vitals:   09/16/20 0830 09/16/20 0900 09/16/20 0930 09/16/20 0949  BP: (!) 93/40 (!) 127/105 (!) 84/41 118/89  Pulse: 91 95 88 (!) 114  Resp:      Temp:      TempSrc:      SpO2:      Weight:      Height:       Physical Exam General: Chronically ill appearing male in NAD Heart: S1,S2 RRR no M/R/G Lungs: CTAB Abdomen: S, NT Extremities: No LE edema. LLE with gauze drsg.  Dialysis Access: [facility] drsg intact   Additional Objective Labs: Basic Metabolic Panel: Recent Labs  Lab 09/14/20 0204 09/14/20 2303 09/15/20 0424 09/16/20 0807  NA 130* 133* 128* 126*  K 4.1 2.7* 3.6 4.1  CL 90* 102 92* 87*  CO2 28  --  22 20*  GLUCOSE 241* 303* 327* 441*  BUN 32* 30* 46* 60*  CREATININE 5.04* 3.90* 6.66* 7.70*  CALCIUM 9.3  --  8.8* 9.0  PHOS 4.7*  --  4.8* 5.0*   Liver Function Tests: Recent Labs  Lab 09/13/20 0123 09/14/20 0204 09/15/20 0424 09/16/20 0807  AST 17 15 14*  --   ALT 13 13 14   --   ALKPHOS 164* 153* 150*  --   BILITOT 1.1 1.1 0.4  --   PROT 6.8 6.4* 6.0*  --   ALBUMIN 2.0* 1.7* 1.6* 1.8*   No results for input(s): LIPASE, AMYLASE in the last 168 hours. CBC: Recent Labs  Lab 09/12/20 0330 09/13/20 0123 09/14/20 0204 09/14/20 2303 09/15/20 0424 09/16/20 0216  WBC 23.1* 26.7* 26.3*  --  24.2* 24.7*  NEUTROABS 20.7* 22.9* 20.0*  --  20.3*  --   HGB 9.9* 9.7* 9.5* 8.5* 8.5* 9.3*  HCT 32.2* 29.0* 29.6* 25.0* 27.7* 29.3*  MCV 92.0 88.1 89.4  --  92.0 88.8  PLT 204 201 191  --  213 205   Blood Culture    Component Value Date/Time   SDES WOUND LEFT WRIST 09/15/2020 0126   SPECREQUEST PT ON VANCOMYCIN 09/15/2020 0126   CULT PENDING 09/15/2020 0126   REPTSTATUS PENDING 09/15/2020 0126    Cardiac Enzymes: No results for input(s): CKTOTAL, CKMB, CKMBINDEX, TROPONINI in the last 168  hours. CBG: Recent Labs  Lab 09/15/20 0306 09/15/20 0647 09/15/20 1717 09/15/20 2048 09/16/20 0640  GLUCAP 364* 257* 220* 268* 375*   Iron Studies: No results for input(s): IRON, TIBC, TRANSFERRIN, FERRITIN in the last 72 hours. @lablastinr3 @ Studies/Results: MR WRIST LEFT WO CONTRAST  Result Date: 09/14/2020 CLINICAL DATA:  Skin ulceration on the volar aspect the left wrist. EXAM: MR OF THE LEFT WRIST WITHOUT CONTRAST TECHNIQUE: Multiplanar, multisequence MR imaging of the left wrist was performed. No intravenous contrast was administered. COMPARISON:  Plain films left wrist 09/13/2020. FINDINGS: Bones/Joint/Cartilage There is no marrow edema to suggest osteomyelitis. No fracture, stress change or worrisome lesion. No joint effusion. No evidence of arthropathy. Ligaments Intact. Muscles and Tendons Mild, ill-defined edema and mild atrophy are seen in the musculature of the hypothenar eminence. Musculature is otherwise negative. No evidence of tenosynovitis. Soft tissues Skin wound is seen on the volar aspect of the wrist. Deep to the wound, there is fluid measuring approximately 0.7 cm AP by 3.2 cm craniocaudal by 1.6 cm transverse. A small amount of fluid in the pisotriquetral  recess is incidentally noted. IMPRESSION: Skin ulceration on the volar aspect of the wrist with underlying somewhat ill-defined fluid compatible with phlegmon or early abscess. Negative for osteomyelitis or septic joint. Mild edema in the musculature of the hypothenar eminence could be due to myositis or early atrophy. Electronically Signed   By: Inge Rise M.D.   On: 09/14/2020 10:27   Medications:  ceFEPime (MAXIPIME) IV 1 g (09/15/20 0926)   vancomycin 750 mg (09/16/20 0948)    (feeding supplement) PROSource Plus  30 mL Oral TID BM   calcitRIOL  2.5 mcg Oral Q M,W,F-HD   Chlorhexidine Gluconate Cloth  6 each Topical Q0600   cinacalcet  60 mg Oral Q breakfast   [START ON 09/20/2020] darbepoetin  (ARANESP) injection - DIALYSIS  60 mcg Intravenous Q Fri-HD   heparin  5,000 Units Subcutaneous Q8H   heparin sodium (porcine)       heparin sodium (porcine)  1,000 Units Intravenous Q M,W,F-HD   insulin aspart  0-5 Units Subcutaneous QHS   insulin aspart  0-6 Units Subcutaneous TID WC   insulin glargine  6 Units Subcutaneous QHS   multivitamin  1 tablet Oral Daily   sevelamer carbonate  4,000 mg Oral TID WC     Dialysis Orders: [facility] on MWFon 4 hours 15 minutes. RRN16 kgHD Bath2K, 2 calcium bath, Heparin5000 units IV initial then 5000 units IV-midrun Calcitriol 3.0 MCG p.o. q. Dialysis no ESA  Assessment/Plan: 1.ESRD -HD MWF.K 3.6. HD today on schedule.OPVVShad scheduled forR BC AVF12/28/2021. 2. Left footwound withosteomyelitis- onIV vancomycin and Zosyn per admit and Dr. March Rummage podiatry.Now has agreed to amputation. Per VVS &ortho BKA is best option and TMA not possible. Going for L BKA today.  3. L wrist lesion - s/p I&D.  No gross purulence. Removal of indurated SQ tissue. Culture sent for micro-NGTD.  Wound packed.  Plan to start hydrotherapy in 3-4 days. 4. Hypertension/volume -Seen on HD pre wt 70 kg. Attempting UFG 2.6L. BP soft but patient is asymptomatic.  5. Anemiaof ESRD-Hgb9.3 Aranesp 46mcg given 12/17, will increase dose next week to 48mcg qwk d/t planned surgery and downward trend. Ferritin 3890, no FE. 6. Metabolic bone disease -Ca and phos at goal. Continue p.o. calcitriolon HD,Renvelaand sensipar 7. Nutrition -carb modified renal protein supplementAlbumin1.7add protein supplement 8. Diabetes mellitus type1-per admit  [name] H. Kyiesha Millward NP-C 09/16/2020, 9:54 AM  [employer] [phone]

## 2020-09-16 NOTE — Anesthesia Postprocedure Evaluation (Signed)
Anesthesia Post Note  Patient: [name]  Procedure(s) Performed: IRRIGATION AND DEBRIDEMENT WRIST (Left )     Patient location during evaluation: PACU Anesthesia Type: General Level of consciousness: sedated and patient cooperative Pain management: pain level controlled Vital Signs Assessment: post-procedure vital signs reviewed and stable Respiratory status: spontaneous breathing Cardiovascular status: stable Anesthetic complications: no   No complications documented.  Last Vitals:  Vitals:   09/16/20 1056 09/16/20 1143  BP: 100/64 106/78  Pulse: 91 92  Resp: 15 16  Temp: 36.8 C 36.8 C  SpO2: 93% 93%    Last Pain:  Vitals:   09/16/20 1143  TempSrc: Oral  PainSc:                  Nolon Nations

## 2020-09-16 NOTE — Plan of Care (Signed)

## 2020-09-16 NOTE — Interval H&P Note (Signed)
History and Physical Interval Note:  09/16/2020 1:19 PM  [name]  has presented today for surgery, with the diagnosis of Critical lower limb ischemia.  The various methods of treatment have been discussed with the patient and family. After consideration of risks, benefits and other options for treatment, the patient has consented to  Procedure(s): AMPUTATION BELOW KNEE (Left) as a surgical intervention.  The patient's history has been reviewed, patient examined, no change in status, stable for surgery.  I have reviewed the patient's chart and labs.  Questions were answered to the patient's satisfaction.     Curt Jews

## 2020-09-16 NOTE — Anesthesia Preprocedure Evaluation (Addendum)
Anesthesia Evaluation  Patient identified by MRN, date of birth, ID band Patient awake    Reviewed: Allergy & Precautions, H&P , NPO status , Patient's Chart, lab work & pertinent test results  Airway Mallampati: II  TM Distance: >3 FB Neck ROM: Full    Dental no notable dental hx. (+) Teeth Intact, Dental Advisory Given   Pulmonary neg pulmonary ROS, Patient abstained from smoking.,    Pulmonary exam normal breath sounds clear to auscultation       Cardiovascular hypertension,  Rhythm:Regular Rate:Normal     Neuro/Psych  Headaches, negative psych ROS   GI/Hepatic negative GI ROS, Neg liver ROS,   Endo/Other  diabetes, Type 1, Insulin Dependent  Renal/GU ESRF and DialysisRenal disease  negative genitourinary   Musculoskeletal  (+) Arthritis , Osteoarthritis,    Abdominal   Peds  Hematology  (+) Blood dyscrasia, anemia ,   Anesthesia Other Findings   Reproductive/Obstetrics negative OB ROS                            Anesthesia Physical Anesthesia Plan  ASA: III  Anesthesia Plan: MAC and Regional   Post-op Pain Management:    Induction: Intravenous  PONV Risk Score and Plan: 1 and Propofol infusion, Midazolam and Ondansetron  Airway Management Planned: Simple Face Mask  Additional Equipment:   Intra-op Plan:   Post-operative Plan:   Informed Consent: I have reviewed the patients History and Physical, chart, labs and discussed the procedure including the risks, benefits and alternatives for the proposed anesthesia with the patient or authorized representative who has indicated his/her understanding and acceptance.     Dental advisory given  Plan Discussed with: CRNA  Anesthesia Plan Comments:         Anesthesia Quick Evaluation

## 2020-09-16 NOTE — Progress Notes (Signed)
Patient was using cursing words on staff all night, was noncompliant to care and instructions, we did everything humanly possible to please him but to no avail, our unit director made aware about patient behavior , he intentionally called 2 of his family members on phone, put them on a loud speaker whiles the NT and RN were in the room caring for him, and curse [place] out for about 6 minutes. He is off the unit to HD, He is NPO since midnight, MRSA PCR done, new IV placed, pain med given,  refused to signed the consent for surgery today. Day shift nurse updated, will continue to monitor.

## 2020-09-16 NOTE — Progress Notes (Signed)
[facility] PROGRESS NOTE    [name]  YQM:[id number] DOB: 04/13/1981 DOA: 09/10/2020 PCP: Kerin Perna, NP      Brief Narrative:  [name] is a 39 y.o. M with ESRD, DM, HTN, and left foot DFU who presented with left hand pain as well as erythema, swelling, and warmth as well as foul drainage of the foot.     Assessment & Plan:  Sepsis due to acute osteomyelitis of the left foot and ankle Blood culture negative -Consult vascular surgery, appreciate cares -Continue vancomycin and cefepime 24 hours post-op  Fluid collection left wrist S/p incision and drainage by Dr. Fredna Dow 12/19 Culture growing staph aureus. -Continue vancomycin  ESRD -Continue Aranesp, cinacalcet, calcitriol, sevelamer -Consult nephrology, appreciate cares  Diabetes Glucoses very labile -Continue Lantus, sliding scale corrections  Hyponatremia Mild asymptomatic  Anemia of chronic renal insufficiency -Repeat CBC tomorrow post op     Disposition: Status is: Inpatient  Remains inpatient appropriate because:IV treatments appropriate due to intensity of illness or inability to take PO   Dispo: The patient is from: Home              Anticipated d/c is to: Home              Anticipated d/c date is: 3 days              Patient currently is not medically stable to d/c.              MDM: The below labs and imaging reports were reviewed and summarized above.  Medication management as above.   DVT prophylaxis: SCD's Start: 09/15/20 0359 heparin injection 5,000 Units Start: 09/11/20 0600  Code Status: Full code Family Communication:             Subjective: No fever, chest pain.  No respiratory complaints.  Left hand comfortable.  No swelling.   No dyspnea.  Objective: Vitals:   09/16/20 1030 09/16/20 1056 09/16/20 1143 09/16/20 1651  BP: 110/70 100/64 106/78 (!) 81/44  Pulse: 95 91 92 73  Resp: (!) 26 15 16    Temp:  98.2 F (36.8 C) 98.3  F (36.8 C) 97.8 F (36.6 C)  TempSrc:  Oral Oral   SpO2:  93% 93% 100%  Weight:  67.9 kg    Height:        Intake/Output Summary (Last 24 hours) at 09/16/2020 1716 Last data filed at 09/16/2020 1654 Gross per 24 hour  Intake 550 ml  Output 2150 ml  Net -1600 ml   Filed Weights   09/13/20 1140 09/16/20 0715 09/16/20 1056  Weight: 68 kg 70 kg 67.9 kg    Examination: General appearance: Thin adult male, no obvious distress, lying down on dialysis     HEENT:    Skin:  Cardiac: Regular rate and rhythm, no murmurs appreciated, JVP normal Respiratory: Normal respiratory rate and respiratory rate Abdomen: Abdomen soft tenderness palpation or guarding or suprapubic distention MSK: The left leg is swollen and red.  Very painful to touch Neuro: Awake and alert, extraocular movements intact, moves all extremities with normal strength coordination, speech fluent Psych: Sensorium intact and responding to questions, attention normal, affect normal, judgment slightly impaired       Data Reviewed: I have personally reviewed following labs and imaging studies:  CBC: Recent Labs  Lab 09/10/20 1655 09/11/20 0517 09/12/20 0330 09/13/20 0123 09/14/20 0204 09/14/20 2303 09/15/20 0424 09/16/20 0216 09/16/20 1431  WBC 31.0*   < >  23.1* 26.7* 26.3*  --  24.2* 24.7*  --   NEUTROABS 28.8*  --  20.7* 22.9* 20.0*  --  20.3*  --   --   HGB 10.8*   < > 9.9* 9.7* 9.5* 8.5* 8.5* 9.3* 9.9*  HCT 36.1*   < > 32.2* 29.0* 29.6* 25.0* 27.7* 29.3* 29.0*  MCV 93.8   < > 92.0 88.1 89.4  --  92.0 88.8  --   PLT 203   < > 204 201 191  --  213 205  --    < > = values in this interval not displayed.   Basic Metabolic Panel: Recent Labs  Lab 09/11/20 1453 09/12/20 0330 09/13/20 0123 09/14/20 0204 09/14/20 2303 09/15/20 0424 09/16/20 0807 09/16/20 1431  NA 129* 133* 127* 130* 133* 128* 126* 134*  K 5.4* 3.6 4.1 4.1 2.7* 3.6 4.1 3.3*  CL 89* 89* 87* 90* 102 92* 87* 100  CO2 16* 22 25 28   --   22 20*  --   GLUCOSE 380* 189* 177* 241* 303* 327* 441* 38*  BUN 59* 31* 45* 32* 30* 46* 60* 27*  CREATININE 9.90* 6.07* 6.99* 5.04* 3.90* 6.66* 7.70* 4.60*  CALCIUM 8.5* 8.5* 9.1 9.3  --  8.8* 9.0  --   MG 2.5* 2.4 2.3 2.3  --  2.3  --   --   PHOS 7.8* 7.2* 5.4* 4.7*  --  4.8* 5.0*  --    GFR: Estimated Creatinine Clearance: 20 mL/min (A) (by C-G formula based on SCr of 4.6 mg/dL (H)). Liver Function Tests: Recent Labs  Lab 09/11/20 1453 09/12/20 0330 09/13/20 0123 09/14/20 0204 09/15/20 0424 09/16/20 0807  AST 15 17 17 15  14*  --   ALT 12 12 13 13 14   --   ALKPHOS 171* 179* 164* 153* 150*  --   BILITOT 2.6* 1.6* 1.1 1.1 0.4  --   PROT 6.5 6.8 6.8 6.4* 6.0*  --   ALBUMIN 2.1* 2.0* 2.0* 1.7* 1.6* 1.8*   No results for input(s): LIPASE, AMYLASE in the last 168 hours. No results for input(s): AMMONIA in the last 168 hours. Coagulation Profile: No results for input(s): INR, PROTIME in the last 168 hours. Cardiac Enzymes: No results for input(s): CKTOTAL, CKMB, CKMBINDEX, TROPONINI in the last 168 hours. BNP (last 3 results) No results for input(s): PROBNP in the last 8760 hours. HbA1C: No results for input(s): HGBA1C in the last 72 hours. CBG: Recent Labs  Lab 09/15/20 1717 09/15/20 2048 09/16/20 0640 09/16/20 1631 09/16/20 1652  GLUCAP 220* 268* 375* 116* 117*   Lipid Profile: No results for input(s): CHOL, HDL, LDLCALC, TRIG, CHOLHDL, LDLDIRECT in the last 72 hours. Thyroid Function Tests: No results for input(s): TSH, T4TOTAL, FREET4, T3FREE, THYROIDAB in the last 72 hours. Anemia Panel: No results for input(s): VITAMINB12, FOLATE, FERRITIN, TIBC, IRON, RETICCTPCT in the last 72 hours. Urine analysis:    Component Value Date/Time   COLORURINE YELLOW 05/26/2015 1115   APPEARANCEUR CLEAR 05/26/2015 1115   APPEARANCEUR Hazy 07/30/2014 2303   LABSPEC 1.016 05/26/2015 1115   LABSPEC 1.012 07/30/2014 2303   PHURINE 8.0 05/26/2015 1115   GLUCOSEU 500 (A)  05/26/2015 1115   GLUCOSEU >=500 07/30/2014 2303   HGBUR MODERATE (A) 05/26/2015 1115   BILIRUBINUR NEGATIVE 05/26/2015 1115   BILIRUBINUR Negative 07/30/2014 2303   KETONESUR 15 (A) 05/26/2015 1115   PROTEINUR >300 (A) 05/26/2015 1115   UROBILINOGEN 0.2 05/26/2015 1115   NITRITE NEGATIVE 05/26/2015 1115  LEUKOCYTESUR NEGATIVE 05/26/2015 1115   LEUKOCYTESUR Negative 07/30/2014 2303   Sepsis Labs: @LABRCNTIP (procalcitonin:4,lacticacidven:4)  ) Recent Results (from the past 240 hour(s))  Blood culture (routine x 2)     Status: None   Collection Time: 09/10/20  4:56 PM   Specimen: BLOOD  Result Value Ref Range Status   Specimen Description BLOOD SITE NOT SPECIFIED  Final   Special Requests   Final    BOTTLES DRAWN AEROBIC AND ANAEROBIC Blood Culture adequate volume   Culture   Final    NO GROWTH 5 DAYS Performed at [facility], [address], [name], [place] [address]    Report Status 09/15/2020 FINAL  Final  Resp Panel by RT-PCR (Flu A&B, Covid) Nasopharyngeal Swab     Status: None   Collection Time: 09/11/20  4:41 AM   Specimen: Nasopharyngeal Swab; Nasopharyngeal(NP) swabs in vial transport medium  Result Value Ref Range Status   SARS Coronavirus 2 by RT PCR NEGATIVE NEGATIVE Final    Comment: (NOTE) SARS-CoV-2 target nucleic acids are NOT DETECTED.  The SARS-CoV-2 RNA is generally detectable in upper respiratory specimens during the acute phase of infection. The lowest concentration of SARS-CoV-2 viral copies this assay can detect is 138 copies/mL. A negative result does not preclude SARS-Cov-2 infection and should not be used as the sole basis for treatment or other patient management decisions. A negative result may occur with  improper specimen collection/handling, submission of specimen other than nasopharyngeal swab, presence of viral mutation(s) within the areas targeted by this assay, and inadequate number of viral copies(<138 copies/mL). A negative  result must be combined with clinical observations, patient history, and epidemiological information. The expected result is Negative.  Fact Sheet for Patients:  EntrepreneurPulse.com.au  Fact Sheet for Healthcare Providers:  IncredibleEmployment.be  This test is no t yet approved or cleared by the [place] FDA and  has been authorized for detection and/or diagnosis of SARS-CoV-2 by FDA under an Emergency Use Authorization (EUA). This EUA will remain  in effect (meaning this test can be used) for the duration of the COVID-19 declaration under Section 564(b)(1) of the Act, 21 U.S.C.section 360bbb-3(b)(1), unless the authorization is terminated  or revoked sooner.       Influenza A by PCR NEGATIVE NEGATIVE Final   Influenza B by PCR NEGATIVE NEGATIVE Final    Comment: (NOTE) The Xpert Xpress SARS-CoV-2/FLU/RSV plus assay is intended as an aid in the diagnosis of influenza from Nasopharyngeal swab specimens and should not be used as a sole basis for treatment. Nasal washings and aspirates are unacceptable for Xpert Xpress SARS-CoV-2/FLU/RSV testing.  Fact Sheet for Patients: EntrepreneurPulse.com.au  Fact Sheet for Healthcare Providers: IncredibleEmployment.be  This test is not yet approved or cleared by the [place] FDA and has been authorized for detection and/or diagnosis of SARS-CoV-2 by FDA under an Emergency Use Authorization (EUA). This EUA will remain in effect (meaning this test can be used) for the duration of the COVID-19 declaration under Section 564(b)(1) of the Act, 21 U.S.C. section 360bbb-3(b)(1), unless the authorization is terminated or revoked.  Performed at [facility], [place] [address], [place], [place] [address]   Blood culture (routine x 2)     Status: None (Preliminary result)   Collection Time: 09/13/20  1:23 AM   Specimen: BLOOD  Result Value Ref Range Status    Specimen Description BLOOD RIGHT ANTECUBITAL  Final   Special Requests   Final    BOTTLES DRAWN AEROBIC AND  ANAEROBIC Blood Culture adequate volume   Culture   Final    NO GROWTH 3 DAYS Performed at [facility], [place] [address], [place], [place] [address]    Report Status PENDING  Incomplete  Aerobic/Anaerobic Culture (surgical/deep wound)     Status: None (Preliminary result)   Collection Time: 09/15/20  1:26 AM   Specimen: Wound; Abscess  Result Value Ref Range Status   Specimen Description WOUND LEFT WRIST  Final   Special Requests PT ON VANCOMYCIN  Final   Gram Stain   Final    NO WBC SEEN NO ORGANISMS SEEN Performed at [facility], [address], [place], [place] [address]    Culture   Final    MODERATE STAPHYLOCOCCUS AUREUS ABUNDANT DIPHTHEROIDS(CORYNEBACTERIUM SPECIES)    Report Status PENDING  Incomplete  MRSA PCR Screening     Status: None   Collection Time: 09/16/20  5:08 AM   Specimen: Nasal Mucosa; Nasopharyngeal  Result Value Ref Range Status   MRSA by PCR NEGATIVE NEGATIVE Final    Comment:        The GeneXpert MRSA Assay (FDA approved for NASAL specimens only), is one component of a comprehensive MRSA colonization surveillance program. It is not intended to diagnose MRSA infection nor to guide or monitor treatment for MRSA infections. Performed at [facility], [place] [address], [place], [place] [address]          Radiology Studies: No results found.      Scheduled Meds: . [MAR Hold] (feeding supplement) PROSource Plus  30 mL Oral TID BM  . [MAR Hold] calcitRIOL  2.5 mcg Oral Q M,W,F-HD  . [MAR Hold] Chlorhexidine Gluconate Cloth  6 each Topical Q0600  . [MAR Hold] cinacalcet  60 mg Oral Q breakfast  . [MAR Hold] darbepoetin (ARANESP) injection - DIALYSIS  60 mcg Intravenous Q Fri-HD  . dextrose      . [MAR Hold] heparin  5,000 Units Subcutaneous Q8H  . [MAR Hold] heparin sodium (porcine)  1,000 Units Intravenous Q  M,W,F-HD  . [MAR Hold] insulin aspart  0-5 Units Subcutaneous QHS  . [MAR Hold] insulin aspart  0-6 Units Subcutaneous TID WC  . [MAR Hold] insulin glargine  6 Units Subcutaneous QHS  . [MAR Hold] multivitamin  1 tablet Oral Daily  . [MAR Hold] sevelamer carbonate  4,000 mg Oral TID WC   Continuous Infusions: . sodium chloride 10 mL/hr at 09/16/20 1346  . [MAR Hold] ceFEPime (MAXIPIME) IV 1 g (09/15/20 0926)  . [MAR Hold] vancomycin Stopped (09/16/20 1048)     LOS: 5 days    Time spent: 25 minutes    Edwin Dada, MD [facility] 09/16/2020, 5:16 PM     Please page though [place] or Epic secure chat:  For [employer], Adult [name]

## 2020-09-16 NOTE — Transfer of Care (Signed)
Immediate Anesthesia Transfer of Care Note  Patient: [name]  Procedure(s) Performed: AMPUTATION BELOW KNEE (Left Knee)  Patient Location: PACU  Anesthesia Type:MAC combined with regional for post-op pain  Level of Consciousness: sedated  Airway & Oxygen Therapy: Patient Spontanous Breathing and Patient connected to face mask oxygen  Post-op Assessment: Report given to RN and Post -op Vital signs reviewed and stable  Post vital signs: Reviewed and stable  Last Vitals:  Vitals Value Taken Time  BP 89/52 09/16/20 1655  Temp    Pulse 72 09/16/20 1659  Resp 8 09/16/20 1659  SpO2 100 % 09/16/20 1659  Vitals shown include unvalidated device data.  Last Pain:  Vitals:   09/16/20 1143  TempSrc: Oral  PainSc:       Patients Stated Pain Goal: 0 (76/73/41 [id number])  Complications: No complications documented.

## 2020-09-17 ENCOUNTER — Encounter (HOSPITAL_COMMUNITY): Payer: Self-pay | Admitting: Vascular Surgery

## 2020-09-17 LAB — RENAL FUNCTION PANEL
Albumin: 2 g/dL — ABNORMAL LOW (ref 3.5–5.0)
Anion gap: 14 (ref 5–15)
BUN: 33 mg/dL — ABNORMAL HIGH (ref 6–20)
CO2: 23 mmol/L (ref 22–32)
Calcium: 8.8 mg/dL — ABNORMAL LOW (ref 8.9–10.3)
Chloride: 96 mmol/L — ABNORMAL LOW (ref 98–111)
Creatinine, Ser: 5.97 mg/dL — ABNORMAL HIGH (ref 0.61–1.24)
GFR, Estimated: 11 mL/min — ABNORMAL LOW (ref >60)
Glucose, Bld: 408 mg/dL — ABNORMAL HIGH (ref 70–99)
Phosphorus: 4.5 mg/dL (ref 2.5–4.6)
Potassium: 4 mmol/L (ref 3.5–5.1)
Sodium: 133 mmol/L — ABNORMAL LOW (ref 135–145)

## 2020-09-17 LAB — CBC
HCT: 25.6 % — ABNORMAL LOW (ref 39.0–52.0)
Hemoglobin: 8 g/dL — ABNORMAL LOW (ref 13.0–17.0)
MCH: 28.6 pg (ref 26.0–34.0)
MCHC: 31.3 g/dL (ref 30.0–36.0)
MCV: 91.4 fL (ref 80.0–100.0)
Platelets: 231 10*3/uL (ref 150–400)
RBC: 2.8 MIL/uL — ABNORMAL LOW (ref 4.22–5.81)
RDW: 15.2 % (ref 11.5–15.5)
WBC: 31.4 10*3/uL — ABNORMAL HIGH (ref 4.0–10.5)
nRBC: 0 % (ref 0.0–0.2)

## 2020-09-17 LAB — SURGICAL PATHOLOGY

## 2020-09-17 MED ORDER — SODIUM CHLORIDE 0.9 % IV SOLN
3.0000 g | Freq: Two times a day (BID) | INTRAVENOUS | Status: DC
  Administered 2020-09-18 – 2020-09-21 (×6): 3 g via INTRAVENOUS
  Filled 2020-09-17: qty 8
  Filled 2020-09-17: qty 3
  Filled 2020-09-17 (×2): qty 8
  Filled 2020-09-17: qty 3
  Filled 2020-09-17 (×5): qty 8
  Filled 2020-09-17: qty 3

## 2020-09-17 NOTE — Plan of Care (Addendum)
Patient was not happy at shift change. Patient demanded he was going off the unit to the cafeteria. Day shift nurse stated order was placed by doctor to go off the unit with friend if staff is not available to do so. Conversation reviewed in secure chat between day shift nurse and doctor.   @1940  Patient off unit with friend per doctor order.   @2030  Patient arrived back on floor. Patient very unset with cafeteria not being able to provide him with the food he wanted. Patient was educated about cafeteria closing prior to going downstairs.  @2045  Director of food nutrition came to front desk and stated patient came down stairs and cussed out all staff and was not allowed back in the cafeteria.   @2100  patient refusing blood sugars  @2220  Patient refused heparin and lantus. Educated patient x2. Was able to get patient to wear SCDs. Patient states he does as he wants.   Will continue to monitor patient.    Problem: Education: Goal: Knowledge of General Education information will improve Description: Including pain rating scale, medication(s)/side effects and non-pharmacologic comfort measures Outcome: Progressing   Problem: Activity: Goal: Risk for activity intolerance will decrease Outcome: Progressing   Problem: Pain Managment: Goal: General experience of comfort will improve Outcome: Progressing   Problem: Safety: Goal: Ability to remain free from injury will improve Outcome: Progressing   Problem: Skin Integrity: Goal: Risk for impaired skin integrity will decrease Outcome: Progressing

## 2020-09-17 NOTE — Progress Notes (Signed)
Nutrition Follow-up  DOCUMENTATION CODES:   Non-severe (moderate) malnutrition in context of chronic illness  INTERVENTION:   -Continue liberalized diet of regular -Continue renal MVI daily -Continue 30 ml Prosource Plus TID, each supplement provides 100 kcals and 15 grams protein  NUTRITION DIAGNOSIS:   Moderate Malnutrition related to chronic illness (ESRD on HD) as evidenced by mild fat depletion,moderate fat depletion,moderate muscle depletion,severe muscle depletion.  Ongoing  GOAL:   Patient will meet greater than or equal to 90% of their needs  Progressing   MONITOR:   PO intake,Supplement acceptance,Labs,Weight trends,Skin,I & O's  REASON FOR ASSESSMENT:   Consult Diet education  ASSESSMENT:   [name] is a 39 y.o. 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  States he has been seeing podiatry about this, Dr. March Rummage.  States wound seems to be worsening and is largere. Now foot has started to become red and warm to the touch with increased pain. Has developed a foul smelling drainage in past few days. He reports subjective fever and intermittent chills the past day. States he has been compliant with his dialysis treatment, due for treatment again tomorrow. He has missed some follow up with podiatry he states. Xray shows osteomyelitis in foot and pt is started on empiric antibiotics in the ER. He has elevated WBC on labs.  12/18- MRI of lt wrist region reveals early abscess vs phlegmon 12/19- s/p PROCEDURE:  Incision and drainage left wrist lesion; cultures sent; plan to start hydrotherapy 3-4 days post-op 12/20- s/p  PROCEDURE: Left below-knee amputation  Reviewed I/O's: +174 ml x 24 hours and -1.5 L since admission  UOP: 0 ml x 24 hours  Attempted to speak with pt via call to hospital room phone,  however, unable to reach.   Per vascular surgery notes, lt BKA site healing well.  Limited meal intake data available. Pt consumed 100% of meal on 09/13/20. Given pt's malnutrition, continue with liberalized diet of regular. K and Phos WDL. Pt is taking the Prosource supplements.   Medications reviewed and include sensipar and renvela.  Labs reviewed: Na: 133, Phos and K WDL, CBGS: 116-170 (inpatient orders for glycemic control are 0-6 units insulin aspart TID with meals, 0-5 units insulin aspart daily at bedtime, 6 units insulin glargine daily at bedtime).   Diet Order:   Diet Order            Diet regular Room service appropriate? Yes; Fluid consistency: Thin  Diet effective now                 EDUCATION NEEDS:   Education needs have been addressed  Skin:  Skin Assessment: Reviewed RN Assessment  Last BM:  09/15/20  Height:   Ht Readings from Last 1 Encounters:  09/10/20 5\' 7"  (1.702 m)    Weight:   Wt Readings from Last 1 Encounters:  09/16/20 67.9 kg    Ideal Body Weight:  62.9 kg (adjusted for lt BKA)  BMI:  Body mass index is 23.45 kg/m.  Estimated Nutritional Needs:   Kcal:  2050-2250  Protein:  105-120 grams  Fluid:  1000 ml + UOP    Loistine Chance, RD, LDN, [place] Registered Dietitian II Certified Diabetes Care and Education Specialist Please refer to [facility] for RD and/or RD on-call/weekend/after hours pager

## 2020-09-17 NOTE — Progress Notes (Signed)
Pharmacy Antibiotic Note  [name] is a 39 y.o. male admitted on 09/10/2020 with L foot wound infection and osteomyelitis. S/P left BKA yesterday with high risk for revision to AKA.  Changing from cefepime to Unasyn today to help target some gram negative organisms and anaerobes- Noted that patient has no history of pseudomonas or other resistant gram negatives.  Last dose of cefepime was this morning around 9 AM.   Plan: Unasyn 3 gm Q 12 hours  Monitor culture data, HD Sessions   Height: 5\' 7"  (170.2 cm) Weight: 67.9 kg (149 lb 11.1 oz) IBW/kg (Calculated) : 66.1  Temp (24hrs), Avg:98.3 F (36.8 C), Min:97.8 F (36.6 C), Max:98.8 F (37.1 C)  Recent Labs  Lab 09/10/20 1656 09/11/20 0303 09/11/20 0517 09/13/20 0123 09/14/20 0204 09/14/20 2303 09/15/20 0424 09/16/20 0141 09/16/20 0216 09/16/20 0807 09/16/20 1431 09/16/20 2159 09/17/20 0428  WBC  --   --    < > 26.7* 26.3*  --  24.2*  --  24.7*  --   --   --  31.4*  CREATININE  --   --    < > 6.99* 5.04*   < > 6.66*  --   --  7.70* 4.60* 5.24* 5.97*  LATICACIDVEN 2.0* 1.7  --   --   --   --   --   --   --   --   --   --   --   VANCOTROUGH  --   --   --   --   --   --   --  21*  --   --   --   --   --    < > = values in this interval not displayed.    Estimated Creatinine Clearance: 15.4 mL/min (A) (by C-G formula based on SCr of 5.97 mg/dL (H)).    No Known Allergies   Thank you for allowing pharmacy to be a part of this patients care.  Jimmy Footman, PharmD, BCPS, BCIDP Infectious Diseases Clinical Pharmacist Phone: [phone] 09/17/2020 12:22 PM

## 2020-09-17 NOTE — Anesthesia Postprocedure Evaluation (Signed)
Anesthesia Post Note  Patient: [name]  Procedure(s) Performed: AMPUTATION BELOW KNEE (Left Knee)     Patient location during evaluation: PACU Anesthesia Type: Regional and MAC Level of consciousness: awake and alert and oriented Pain management: pain level controlled Vital Signs Assessment: post-procedure vital signs reviewed and stable Respiratory status: spontaneous breathing, nonlabored ventilation and respiratory function stable Cardiovascular status: blood pressure returned to baseline and stable Postop Assessment: no apparent nausea or vomiting Anesthetic complications: no   No complications documented.                 Halvor Behrend A.

## 2020-09-17 NOTE — Progress Notes (Signed)
Physical Therapy Treatment Patient Details Name: [name] MRN: [id number] DOB: [birth year] Today's Date: 09/17/2020    History of Present Illness Patient is a 39 y/o male with PMH significant for DM Type 1, gastroparesis, HTN, ESRD, chronic L foot diabetic wound infection. Patient admitted for sepsis 2/2 acute osteomyelitis involving L ankle and foot. 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    PT Comments    Patient refused OOB mobility this session and refuses to look at residual limb. Half time spent for calming patient down due to recent surgery and anxiety related to surgery. Educated patient on phantom limb sensation/pain and strategies to reduce sensation, patient refuses to look at and touch residual limb. Educated on positioning to prevent knee flexion contracture and benefits of prosthetic use. Recommend HHPT following discharge to maximize functional mobility, refuses SNF recommendation.     Follow Up Recommendations  Home health PT;Supervision/Assistance - 24 hour (refuses SNF option)     Equipment Recommendations  Rolling Abryana Lykens with 5" wheels;3in1 (PT);Wheelchair (measurements PT);Wheelchair cushion (measurements PT)    Recommendations for Other Services       Precautions / Restrictions Precautions Precautions: Fall Restrictions Weight Bearing Restrictions: Yes LLE Weight Bearing: Non weight bearing    Mobility  Bed Mobility               General bed mobility comments: refused OOB mobility this session due to reports of anxiety and not wanting to look at residual limb  Transfers                    Ambulation/Gait                 Stairs             Wheelchair Mobility    Modified Rankin (Stroke Patients Only)       Balance                                            Cognition Arousal/Alertness: Awake/alert Behavior During Therapy: Agitated;Anxious Overall Cognitive Status: Within  Functional Limits for tasks assessed                                 General Comments: anxious and agitated on arrival due to recent surgery      Exercises      General Comments General comments (skin integrity, edema, etc.): Agreeable to eduation but refuses OOB mobility. Educated patient on phantom limb sensations/pain and positioning of L residual limb      Pertinent Vitals/Pain Pain Assessment: Faces Faces Pain Scale: Hurts a little bit Pain Location: L LE Pain Descriptors / Indicators: Guarding;Grimacing Pain Intervention(s): Repositioned    Home Living                      Prior Function            PT Goals (current goals can now be found in the care plan section) Acute Rehab PT Goals Patient Stated Goal: to go home PT Goal Formulation: With patient Time For Goal Achievement: 09/27/20 Potential to Achieve Goals: Good Progress towards PT goals: Not progressing toward goals - comment (refuses OOB)    Frequency    Min 3X/week  PT Plan Current plan remains appropriate    Co-evaluation              AM-PAC PT "6 Clicks" Mobility   Outcome Measure  Help needed turning from your back to your side while in a flat bed without using bedrails?: None Help needed moving from lying on your back to sitting on the side of a flat bed without using bedrails?: None Help needed moving to and from a bed to a chair (including a wheelchair)?: A Little Help needed standing up from a chair using your arms (e.g., wheelchair or bedside chair)?: A Little Help needed to walk in hospital room?: A Little Help needed climbing 3-5 steps with a railing? : A Lot 6 Click Score: 19    End of Session   Activity Tolerance: Patient tolerated treatment well Patient left: in bed;with call bell/phone within reach Nurse Communication: Mobility status PT Visit Diagnosis: Unsteadiness on feet (R26.81);Other abnormalities of gait and mobility (R26.89);Muscle  weakness (generalized) (M62.81);Difficulty in walking, not elsewhere classified (R26.2)     Time: 1137-1220 PT Time Calculation (min) (ACUTE ONLY): 43 min  Charges:  $Therapeutic Activity: 23-37 mins                     Perrin Maltese, PT, DPT Acute Rehabilitation Services Pager [phone] Office [phone]    Alda Lea 09/17/2020, 12:33 PM

## 2020-09-17 NOTE — Progress Notes (Signed)
[facility] PROGRESS NOTE    [name]  DEY:[id number] DOB: 02-09-1981 DOA: 09/10/2020 PCP: Kerin Perna, NP      Brief Narrative:  [name] is a 39 y.o. M with ESRD, DM, HTN, and left foot DFU who presented with left hand pain as well as erythema, swelling, and warmth as well as foul drainage of the foot.     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 Donnetta Hutching on 12/20 Patient admitted and started on antibiotics.  Evaluated by Podiatry (who had been managing his left foot wounds and chronic osteomyelitis as an outpatient) and Vascular surgery who recommended amputation.    Taken to OR on 12/20 which showed extensive gangrenous muscle and purulent collections along the left tibia.  Prior cultures with enterococcus and staph, never MDR gram negatives.    -Follow intraoperative culture from 12/20 -Continue vancomycin -Narrow to Unasyn   Abscess left wrist S/p incision and drainage by Dr. Fredna Dow 12/19 Also noted to have a red swollen fluctuant left wrist lesion.  Drained by orthopedics.  Culture growing staph aureus.  Sensitivities still pending -Continue vancomycin until sensitivities return  ESRD -Continue Aranesp, cinacalcet, calcitriol, sevelamer -Consult nephrology, appreciate cares  Diabetes, poorly controlled, with severe hyperglycemia Patient refuses diabetic diet.  Diabetes coordinators familiar with patient. -Continue basal insulin -Continue SS corrections -Check BMET to monitor for DKA  Hyponatremia Mild asymptomatic  Anemia of chronic renal insufficiency Hgb stable post-op     Disposition: Status is: Inpatient  Remains inpatient appropriate because:IV treatments appropriate due to intensity of illness or inability to take PO   Dispo: The patient is from: Home              Anticipated d/c is to: Home              Anticipated d/c date is: 3 days               Patient currently is not medically stable to d/c.              MDM: The below labs and imaging reports were reviewed and summarized above.  Medication management as above.   DVT prophylaxis: SCD's Start: 09/15/20 0359 heparin injection 5,000 Units Start: 09/11/20 0600  Code Status: Full code Family Communication:  Called mother by phone, no answer           Subjective: He is afraid to look at his leg.  There is no purulent drainage from the leg.  He has had no fever, dyspnea, chest pain, respiratory complaints, swelling.   No hemorrhage.      Objective: Vitals:   09/17/20 0345 09/17/20 0851 09/17/20 0851 09/17/20 1700  BP: 128/78 128/85 128/85 122/78  Pulse: 86 99 99 88  Resp: 17 17 17 17   Temp: 98.6 F (37 C) 98.1 F (36.7 C) 98.1 F (36.7 C) 98.4 F (36.9 C)  TempSrc: Oral Oral Oral Oral  SpO2: 98% 94% 94% 98%  Weight:      Height:        Intake/Output Summary (Last 24 hours) at 09/17/2020 1734 Last data filed at 09/17/2020 0404 Gross per 24 hour  Intake 1774.05 ml  Output --  Net 1774.05 ml   Filed Weights   09/13/20 1140 09/16/20 0715 09/16/20 1056  Weight: 68 kg 70 kg 67.9 kg    Examination: General appearance: Thin adult male, lying in  bed, interactive     HEENT:   Oropharynx moist, lips normal, dentition in good repair, no oral lesions, hearing normal. Skin: Numerous scattered hyperpigmented papules on the skin, some prurigo nodularis.  Skin around the stump appears uninflamed. Cardiac: RRR, no murmurs, no lower extremity edema. Respiratory: Normal respiratory rate and rhythm, lungs clear without rales or wheezes Abdomen: Abdomen soft without tenderness palpation.  No guarding. MSK: Status post left BKA. Neuro: Awake and alert, extraocular movements intact, moves all extremities with normal strength and coordination, speech fluent. Psych: Rapid pressured speech, distractible.  Psychomotor agitation is noted.       Data  Reviewed: I have personally reviewed following labs and imaging studies:  CBC: Recent Labs  Lab 09/12/20 0330 09/13/20 0123 09/14/20 0204 09/14/20 2303 09/15/20 0424 09/16/20 0216 09/16/20 1431 09/17/20 0428  WBC 23.1* 26.7* 26.3*  --  24.2* 24.7*  --  31.4*  NEUTROABS 20.7* 22.9* 20.0*  --  20.3*  --   --   --   HGB 9.9* 9.7* 9.5* 8.5* 8.5* 9.3* 9.9* 8.0*  HCT 32.2* 29.0* 29.6* 25.0* 27.7* 29.3* 29.0* 25.6*  MCV 92.0 88.1 89.4  --  92.0 88.8  --  91.4  PLT 204 201 191  --  213 205  --  629   Basic Metabolic Panel: Recent Labs  Lab 09/11/20 1453 09/12/20 0330 09/13/20 0123 09/14/20 0204 09/14/20 2303 09/15/20 0424 09/16/20 0807 09/16/20 1431 09/16/20 2159 09/17/20 0428  NA 129* 133* 127* 130*   < > 128* 126* 134* 135 133*  K 5.4* 3.6 4.1 4.1   < > 3.6 4.1 3.3* 3.9 4.0  CL 89* 89* 87* 90*   < > 92* 87* 100 96* 96*  CO2 16* 22 25 28   --  22 20*  --  23 23  GLUCOSE 380* 189* 177* 241*   < > 327* 441* 38* 298* 408*  BUN 59* 31* 45* 32*   < > 46* 60* 27* 31* 33*  CREATININE 9.90* 6.07* 6.99* 5.04*   < > 6.66* 7.70* 4.60* 5.24* 5.97*  CALCIUM 8.5* 8.5* 9.1 9.3  --  8.8* 9.0  --  8.8* 8.8*  MG 2.5* 2.4 2.3 2.3  --  2.3  --   --   --   --   PHOS 7.8* 7.2* 5.4* 4.7*  --  4.8* 5.0*  --   --  4.5   < > = values in this interval not displayed.   GFR: Estimated Creatinine Clearance: 15.4 mL/min (A) (by C-G formula based on SCr of 5.97 mg/dL (H)). Liver Function Tests: Recent Labs  Lab 09/11/20 1453 09/12/20 0330 09/13/20 0123 09/14/20 0204 09/15/20 0424 09/16/20 0807 09/17/20 0428  AST 15 17 17 15  14*  --   --   ALT 12 12 13 13 14   --   --   ALKPHOS 171* 179* 164* 153* 150*  --   --   BILITOT 2.6* 1.6* 1.1 1.1 0.4  --   --   PROT 6.5 6.8 6.8 6.4* 6.0*  --   --   ALBUMIN 2.1* 2.0* 2.0* 1.7* 1.6* 1.8* 2.0*   No results for input(s): LIPASE, AMYLASE in the last 168 hours. No results for input(s): AMMONIA in the last 168 hours. Coagulation Profile: No results for  input(s): INR, PROTIME in the last 168 hours. Cardiac Enzymes: No results for input(s): CKTOTAL, CKMB, CKMBINDEX, TROPONINI in the last 168 hours. BNP (last 3 results) No results for input(s): PROBNP in the  last 8760 hours. HbA1C: No results for input(s): HGBA1C in the last 72 hours. CBG: Recent Labs  Lab 09/15/20 2048 09/16/20 0640 09/16/20 1631 09/16/20 1652 09/16/20 2037  GLUCAP 268* 375* 116* 117* 170*   Lipid Profile: No results for input(s): CHOL, HDL, LDLCALC, TRIG, CHOLHDL, LDLDIRECT in the last 72 hours. Thyroid Function Tests: No results for input(s): TSH, T4TOTAL, FREET4, T3FREE, THYROIDAB in the last 72 hours. Anemia Panel: No results for input(s): VITAMINB12, FOLATE, FERRITIN, TIBC, IRON, RETICCTPCT in the last 72 hours. Urine analysis:    Component Value Date/Time   COLORURINE YELLOW 05/26/2015 [place] 05/26/2015 1115   APPEARANCEUR Hazy 07/30/2014 2303   LABSPEC 1.016 05/26/2015 1115   LABSPEC 1.012 07/30/2014 2303   PHURINE 8.0 05/26/2015 1115   GLUCOSEU 500 (A) 05/26/2015 1115   GLUCOSEU >=500 07/30/2014 2303   HGBUR MODERATE (A) 05/26/2015 1115   BILIRUBINUR NEGATIVE 05/26/2015 1115   BILIRUBINUR Negative 07/30/2014 2303   KETONESUR 15 (A) 05/26/2015 1115   PROTEINUR >300 (A) 05/26/2015 1115   UROBILINOGEN 0.2 05/26/2015 1115   NITRITE NEGATIVE 05/26/2015 1115   LEUKOCYTESUR NEGATIVE 05/26/2015 1115   LEUKOCYTESUR Negative 07/30/2014 2303   Sepsis Labs: @LABRCNTIP (procalcitonin:4,lacticacidven:4)  ) Recent Results (from the past 240 hour(s))  Blood culture (routine x 2)     Status: None   Collection Time: 09/10/20  4:56 PM   Specimen: BLOOD  Result Value Ref Range Status   Specimen Description BLOOD SITE NOT SPECIFIED  Final   Special Requests   Final    BOTTLES DRAWN AEROBIC AND ANAEROBIC Blood Culture adequate volume   Culture   Final    NO GROWTH 5 DAYS Performed at [facility], [place] [address], [place],  [place] [address]    Report Status 09/15/2020 FINAL  Final  Resp Panel by RT-PCR (Flu A&B, Covid) Nasopharyngeal Swab     Status: None   Collection Time: 09/11/20  4:41 AM   Specimen: Nasopharyngeal Swab; Nasopharyngeal(NP) swabs in vial transport medium  Result Value Ref Range Status   SARS Coronavirus 2 by RT PCR NEGATIVE NEGATIVE Final    Comment: (NOTE) SARS-CoV-2 target nucleic acids are NOT DETECTED.  The SARS-CoV-2 RNA is generally detectable in upper respiratory specimens during the acute phase of infection. The lowest concentration of SARS-CoV-2 viral copies this assay can detect is 138 copies/mL. A negative result does not preclude SARS-Cov-2 infection and should not be used as the sole basis for treatment or other patient management decisions. A negative result may occur with  improper specimen collection/handling, submission of specimen other than nasopharyngeal swab, presence of viral mutation(s) within the areas targeted by this assay, and inadequate number of viral copies(<138 copies/mL). A negative result must be combined with clinical observations, patient history, and epidemiological information. The expected result is Negative.  Fact Sheet for Patients:  EntrepreneurPulse.com.au  Fact Sheet for Healthcare Providers:  IncredibleEmployment.be  This test is no t yet approved or cleared by the [place] FDA and  has been authorized for detection and/or diagnosis of SARS-CoV-2 by FDA under an Emergency Use Authorization (EUA). This EUA will remain  in effect (meaning this test can be used) for the duration of the COVID-19 declaration under Section 564(b)(1) of the Act, 21 U.S.C.section 360bbb-3(b)(1), unless the authorization is terminated  or revoked sooner.       Influenza A by PCR NEGATIVE NEGATIVE Final   Influenza B by PCR NEGATIVE NEGATIVE Final    Comment: (NOTE) The  Xpert Xpress SARS-CoV-2/FLU/RSV plus assay is intended as  an aid in the diagnosis of influenza from Nasopharyngeal swab specimens and should not be used as a sole basis for treatment. Nasal washings and aspirates are unacceptable for Xpert Xpress SARS-CoV-2/FLU/RSV testing.  Fact Sheet for Patients: EntrepreneurPulse.com.au  Fact Sheet for Healthcare Providers: IncredibleEmployment.be  This test is not yet approved or cleared by the [place] FDA and has been authorized for detection and/or diagnosis of SARS-CoV-2 by FDA under an Emergency Use Authorization (EUA). This EUA will remain in effect (meaning this test can be used) for the duration of the COVID-19 declaration under Section 564(b)(1) of the Act, 21 U.S.C. section 360bbb-3(b)(1), unless the authorization is terminated or revoked.  Performed at [facility], [place] [address], [place], [place] [address]   Blood culture (routine x 2)     Status: None (Preliminary result)   Collection Time: 09/13/20  1:23 AM   Specimen: BLOOD  Result Value Ref Range Status   Specimen Description BLOOD RIGHT ANTECUBITAL  Final   Special Requests   Final    BOTTLES DRAWN AEROBIC AND ANAEROBIC Blood Culture adequate volume   Culture   Final    NO GROWTH 4 DAYS Performed at [facility], [place] [address], [place], [place] [address]    Report Status PENDING  Incomplete  Aerobic/Anaerobic Culture (surgical/deep wound)     Status: None (Preliminary result)   Collection Time: 09/15/20  1:26 AM   Specimen: Wound; Abscess  Result Value Ref Range Status   Specimen Description WOUND LEFT WRIST  Final   Special Requests PT ON VANCOMYCIN  Final   Gram Stain NO WBC SEEN NO ORGANISMS SEEN   Final   Culture   Final    MODERATE STAPHYLOCOCCUS AUREUS ABUNDANT DIPHTHEROIDS(CORYNEBACTERIUM SPECIES) SUSCEPTIBILITIES TO FOLLOW Performed at [facility], [address] [place], [place] [address]    Report Status PENDING  Incomplete  MRSA PCR Screening      Status: None   Collection Time: 09/16/20  5:08 AM   Specimen: Nasal Mucosa; Nasopharyngeal  Result Value Ref Range Status   MRSA by PCR NEGATIVE NEGATIVE Final    Comment:        The GeneXpert MRSA Assay (FDA approved for NASAL specimens only), is one component of a comprehensive MRSA colonization surveillance program. It is not intended to diagnose MRSA infection nor to guide or monitor treatment for MRSA infections. Performed at [facility], [place] [address] [place],  [address]   Aerobic Culture (superficial specimen)     Status: None (Preliminary result)   Collection Time: 09/16/20  3:49 PM   Specimen: Leg, Left; Wound  Result Value Ref Range Status   Specimen Description WOUND LEFT LEG  Final   Special Requests PT ON CEFEPIME LEFT LEG ANTERIOR COMPARTMENT  Final   Gram Stain NO WBC SEEN NO ORGANISMS SEEN   Final   Culture   Final    TOO YOUNG TO READ Performed at [facility], [address], [place],  [address]    Report Status PENDING  Incomplete         Radiology Studies: No results found.      Scheduled Meds: . (feeding supplement) PROSource Plus  30 mL Oral TID BM  . calcitRIOL  2.5 mcg Oral Q M,W,F-HD  . Chlorhexidine Gluconate Cloth  6 each Topical Q0600  . cinacalcet  60 mg Oral Q breakfast  . [START ON  09/20/2020] darbepoetin (ARANESP) injection - DIALYSIS  60 mcg Intravenous Q Fri-HD  . heparin  5,000 Units Subcutaneous Q8H  . heparin sodium (porcine)  1,000 Units Intravenous Q M,W,F-HD  . insulin aspart  0-5 Units Subcutaneous QHS  . insulin aspart  0-6 Units Subcutaneous TID WC  . insulin glargine  6 Units Subcutaneous QHS  . multivitamin  1 tablet Oral Daily  . sevelamer carbonate  4,000 mg Oral TID WC   Continuous Infusions: . [START ON 09/18/2020] ampicillin-sulbactam (UNASYN) IV    . vancomycin Stopped (09/16/20 1048)     LOS: 6 days    Time spent: 25 minutes    Edwin Dada, MD [facility] 09/17/2020, 5:34 PM     Please page though [place] or Epic secure chat:  For [employer], Adult nurse

## 2020-09-17 NOTE — Progress Notes (Signed)
[facility] Progress Note   Subjective: Agitated but not being abusive. PT at bedside to work with him but he does not want to look at stump. Emotional support to pt. HD tomorrow on schedule.      Objective Vitals:   09/17/20 0100 09/17/20 0345 09/17/20 0851 09/17/20 0851  BP: 100/68 128/78 128/85 128/85  Pulse: 80 86 99 99  Resp: 16 17 17 17   Temp: 98.8 F (37.1 C) 98.6 F (37 C) 98.1 F (36.7 C) 98.1 F (36.7 C)  TempSrc: Oral Oral Oral Oral  SpO2: 99% 98% 94% 94%  Weight:      Height:       Physical Exam General: Chronically ill appearing male in NAD Heart: S1,S2 RRR no M/R/G Lungs: CTAB Abdomen: S, NT Extremities: No LE edema. L BKA with ACE wrap. Drsg CDI.  Dialysis Access: [facility] drsg intact    Additional Objective Labs: Basic Metabolic Panel: Recent Labs  Lab 09/15/20 0424 09/16/20 0807 09/16/20 1431 09/16/20 2159 09/17/20 0428  NA 128* 126* 134* 135 133*  K 3.6 4.1 3.3* 3.9 4.0  CL 92* 87* 100 96* 96*  CO2 22 20*  --  23 23  GLUCOSE 327* 441* 38* 298* 408*  BUN 46* 60* 27* 31* 33*  CREATININE 6.66* 7.70* 4.60* 5.24* 5.97*  CALCIUM 8.8* 9.0  --  8.8* 8.8*  PHOS 4.8* 5.0*  --   --  4.5   Liver Function Tests: Recent Labs  Lab 09/13/20 0123 09/14/20 0204 09/15/20 0424 09/16/20 0807 09/17/20 0428  AST 17 15 14*  --   --   ALT 13 13 14   --   --   ALKPHOS 164* 153* 150*  --   --   BILITOT 1.1 1.1 0.4  --   --   PROT 6.8 6.4* 6.0*  --   --   ALBUMIN 2.0* 1.7* 1.6* 1.8* 2.0*   No results for input(s): LIPASE, AMYLASE in the last 168 hours. CBC: Recent Labs  Lab 09/13/20 0123 09/14/20 0204 09/14/20 2303 09/15/20 0424 09/16/20 0216 09/16/20 1431 09/17/20 0428  WBC 26.7* 26.3*  --  24.2* 24.7*  --  31.4*  NEUTROABS 22.9* 20.0*  --  20.3*  --   --   --   HGB 9.7* 9.5*   < > 8.5* 9.3* 9.9* 8.0*  HCT 29.0* 29.6*   < > 27.7* 29.3* 29.0* 25.6*  MCV 88.1 89.4  --  92.0 88.8  --  91.4  PLT 201 191  --  213 205  --  231   < > =  values in this interval not displayed.   Blood Culture    Component Value Date/Time   SDES WOUND LEFT LEG 09/16/2020 1549   SPECREQUEST PT ON CEFEPIME LEFT LEG ANTERIOR COMPARTMENT 09/16/2020 1549   CULT  09/16/2020 1549    TOO YOUNG TO READ Performed at [facility], [place] [address] [place], [place] [address]    REPTSTATUS PENDING 09/16/2020 1549    Cardiac Enzymes: No results for input(s): CKTOTAL, CKMB, CKMBINDEX, TROPONINI in the last 168 hours. CBG: Recent Labs  Lab 09/15/20 2048 09/16/20 0640 09/16/20 1631 09/16/20 1652 09/16/20 2037  GLUCAP 268* 375* 116* 117* 170*   Iron Studies: No results for input(s): IRON, TIBC, TRANSFERRIN, FERRITIN in the last 72 hours. @lablastinr3 @ Studies/Results: No results found. Medications: . vancomycin Stopped (09/16/20 1048)   . (feeding supplement) PROSource Plus  30 mL Oral TID BM  . calcitRIOL  2.5 mcg Oral Q M,W,F-HD  . Chlorhexidine Gluconate Cloth  6 each Topical Q0600  . cinacalcet  60 mg Oral Q breakfast  . [START ON 09/20/2020] darbepoetin (ARANESP) injection - DIALYSIS  60 mcg Intravenous Q Fri-HD  . heparin  5,000 Units Subcutaneous Q8H  . heparin sodium (porcine)  1,000 Units Intravenous Q M,W,F-HD  . insulin aspart  0-5 Units Subcutaneous QHS  . insulin aspart  0-6 Units Subcutaneous TID WC  . insulin glargine  6 Units Subcutaneous QHS  . multivitamin  1 tablet Oral Daily  . sevelamer carbonate  4,000 mg Oral TID WC     Dialysis Orders: [facility] on MWFon 4 hours 15 minutes. CLE75 kgHD Bath2K, 2 calcium bath, Heparin5000 units IV initial then 5000 units IV-midrun Calcitriol 3.0 MCG p.o. q. Dialysis no ESA  Assessment/Plan: 1.ESRD -HD MWF.K 3.6.HD today on schedule.OPVVShad scheduled forR BC AVF12/28/2021. 2. Left footwound withosteomyelitis-S/P L BKA 12/20 per Dr. Donnetta Hutching. Patient not dealing with this well. PT consulted. Continue Vanc & Cefepime.  3. L wrist lesion - s/p I&D. No gross  purulence. Removal of indurated SQ tissue. Culture sent for micro-NGTD. Wound packed. Plan to start hydrotherapy in 3-4 days. 4. Hypertension/volume -HD 12/20 Net UF 2 liters. Post wt 67.9 kg. UF as tolerated tomorrow. Lower EDW as tolerated.  5. Anemiaof ESRD-Hgb8.0 Aranesp 48mcg given 12/17, will increase dose next week to6mcg qwk d/t planned surgery and downward trend.Ferritin 3890, no FE.Transfuse as needed.  6. Metabolic bone disease -Ca and phos at goal. Continue p.o. calcitriolon HD,Renvelaand sensipar 7. Nutrition -carb modified renal protein supplementAlbumin2.0added protein supplement, renal vits 8. Diabetes mellitus type1-per admit  Inita Uram H. Estle Huguley NP-C 09/17/2020, 12:03 PM  [employer] [phone]

## 2020-09-17 NOTE — Anesthesia Postprocedure Evaluation (Deleted)
Anesthesia Post Note  Patient: [name]  Procedure(s) Performed: AMPUTATION BELOW KNEE (Left Knee)     Patient location during evaluation: PACU Anesthesia Type: Regional and MAC Level of consciousness: awake and alert and oriented Pain management: pain level controlled Vital Signs Assessment: post-procedure vital signs reviewed and stable Respiratory status: spontaneous breathing, nonlabored ventilation and respiratory function stable Cardiovascular status: stable and blood pressure returned to baseline Postop Assessment: no apparent nausea or vomiting Anesthetic complications: no   No complications documented.                Yvetta Drotar A.

## 2020-09-17 NOTE — Progress Notes (Signed)
Primary placed order for pt with off unit privileges with friend; if unable to have staff; handoff given to night shift RN.

## 2020-09-17 NOTE — Progress Notes (Signed)
  Progress Note    09/17/2020 9:30 AM 1 Day Post-Op  Subjective: States he has a lot of anxiety about his leg and he cannot look at it. Pain overall well controlled   Vitals:   09/17/20 0100 09/17/20 0345  BP: 100/68 128/78  Pulse: 80 86  Resp: 16 17  Temp: 98.8 F (37.1 C) 98.6 F (37 C)  SpO2: 99% 98%   Physical Exam: Cardiac: regular Lungs: non labored Incisions:  Left BKA with lateral wound as shown below. Tissue looks healthy. Wet to dry packings changed. No purulent drainage appreciable. Medial stable line intact and well appearing  Extremities:  Well perfused and warm Neurologic: alert and oriented  CBC    Component Value Date/Time   WBC 31.4 (H) 09/17/2020 0428   RBC 2.80 (L) 09/17/2020 0428   HGB 8.0 (L) 09/17/2020 0428   HGB 9.7 (L) 08/22/2014 0447   HCT 25.6 (L) 09/17/2020 0428   HCT 30.0 (L) 08/22/2014 0447   PLT 231 09/17/2020 0428   PLT 158 08/22/2014 0447   MCV 91.4 09/17/2020 0428   MCV 97 08/22/2014 0447   MCH 28.6 09/17/2020 0428   MCHC 31.3 09/17/2020 0428   RDW 15.2 09/17/2020 0428   RDW 15.3 (H) 08/22/2014 0447   LYMPHSABS 1.0 09/15/2020 0424   LYMPHSABS 1.7 08/22/2014 0447   MONOABS 2.4 (H) 09/15/2020 0424   MONOABS 0.8 08/22/2014 0447   EOSABS 0.2 09/15/2020 0424   EOSABS 0.4 08/22/2014 0447   BASOSABS 0.2 (H) 09/15/2020 0424   BASOSABS 0.1 08/22/2014 0447    BMET    Component Value Date/Time   NA 133 (L) 09/17/2020 0428   NA 138 08/22/2014 0447   K 4.0 09/17/2020 0428   K 4.0 08/22/2014 0447   CL 96 (L) 09/17/2020 0428   CL 100 08/22/2014 0447   CO2 23 09/17/2020 0428   CO2 30 08/22/2014 0447   GLUCOSE 408 (H) 09/17/2020 0428   GLUCOSE 88 08/22/2014 0447   BUN 33 (H) 09/17/2020 0428   BUN 14 08/22/2014 0447   CREATININE 5.97 (H) 09/17/2020 0428   CREATININE 5.00 (H) 08/22/2014 0447   CALCIUM 8.8 (L) 09/17/2020 0428   CALCIUM 8.2 (L) 08/22/2014 0447   GFRNONAA 11 (L) 09/17/2020 0428   GFRNONAA 14 (L) 08/22/2014 0447    GFRNONAA 5 (L) 06/19/2014 0937   GFRAA 7 (L) 05/30/2020 1923   GFRAA 17 (L) 08/22/2014 0447   GFRAA 6 (L) 06/19/2014 0937    INR    Component Value Date/Time   INR 1.2 07/22/2020 0618     Intake/Output Summary (Last 24 hours) at 09/17/2020 0930 Last data filed at 09/17/2020 0404 Gross per 24 hour  Intake 2324.05 ml  Output 2150 ml  Net 174.05 ml     Assessment/Plan:  39 y.o. male is s/p left below knee amputation 1 Day Post-Op. Left BKA stump packing changed. Wound on lateral leg well appearing. No purulence. Surgical cultures pending. Remains on IV Cefepime. Afebrile. Leukocytosis. Continue BID packing changes. High risk for more proximal amputation   Karoline Caldwell, PA-C [facility] [phone] 09/17/2020 9:30 AM

## 2020-09-17 NOTE — Progress Notes (Signed)
Inpatient Diabetes Program Recommendations  AACE/ADA: New Consensus Statement on Inpatient Glycemic Control (2015)  Target Ranges:  Prepandial:   less than 140 mg/dL      Peak postprandial:   less than 180 mg/dL (1-2 hours)      Critically ill patients:  140 - 180 mg/dL   Lab Results  Component Value Date   GLUCAP 170 (H) 09/16/2020   HGBA1C 7.5 (H) 07/22/2020    Review of Glycemic Control Results for [name], [name] (MRN [id number]) as of 09/17/2020 12:32  Ref. Range 09/17/2020 04:28  Glucose Latest Ref Range: 70 - 99 mg/dL 408 (H)   Results for [name], [name] (MRN [id number]) as of 09/17/2020 12:32  Ref. Range 09/16/2020 14:31  Glucose Latest Ref Range: 70 - 99 mg/dL 38 (LL)   Diabetes history: Type 1 DM Outpatient Diabetes medications: Novolog 6 units BID AC Current orders for Inpatient glycemic control: Lantus 6 units QD, Novolog 0-6 units TID, Novolog 0-5 units QHS  Inpatient Diabetes Program Recommendations:    Patient with labile blood sugars. AM serum was 408 mg/dL. Patient refusing CBGs this AM and correction. Could consider adding CMP onto labs. Discussed with Dr Loleta Books.   Thanks, Bronson Curb, MSN, RNC-OB Diabetes Coordinator [phone] (8a-5p)

## 2020-09-18 LAB — SURGICAL PATHOLOGY

## 2020-09-18 LAB — CULTURE, BLOOD (ROUTINE X 2)
Culture: NO GROWTH
Special Requests: ADEQUATE

## 2020-09-18 MED ORDER — RENA-VITE PO TABS
1.0000 | ORAL_TABLET | Freq: Every day | ORAL | Status: DC
  Administered 2020-09-18 – 2020-10-10 (×20): 1 via ORAL
  Filled 2020-09-18 (×23): qty 1

## 2020-09-18 NOTE — Progress Notes (Signed)
Pharmacy notified that the patient has not gone to dialysis yet. Note on MAR- to contact Pharmacy if patient's dialysis is delayed or cancel ( Vancomycin on HOLD) per pharmacy

## 2020-09-18 NOTE — Progress Notes (Addendum)
  Progress Note    09/18/2020 8:02 AM 2 Days Post-Op  Subjective:  States he is feeling better compared to before surgery and has appetite returning.  Denies N/V, subjective fever   Vitals:   09/17/20 2000 09/18/20 0500  BP: 91/65 108/77  Pulse: 100 97  Resp: 17 17  Temp: 98 F (36.7 C) 98.3 F (36.8 C)  SpO2: 98% 98%   Physical Exam: Lungs:  Non labored Incisions:  L BKA incision with viable skin edges of medial and anterior incision; no purulent drainage from lateral incision; healthy muscle tissue noted Neurologic: A&O  CBC    Component Value Date/Time   WBC 31.4 (H) 09/17/2020 0428   RBC 2.80 (L) 09/17/2020 0428   HGB 8.0 (L) 09/17/2020 0428   HGB 9.7 (L) 08/22/2014 0447   HCT 25.6 (L) 09/17/2020 0428   HCT 30.0 (L) 08/22/2014 0447   PLT 231 09/17/2020 0428   PLT 158 08/22/2014 0447   MCV 91.4 09/17/2020 0428   MCV 97 08/22/2014 0447   MCH 28.6 09/17/2020 0428   MCHC 31.3 09/17/2020 0428   RDW 15.2 09/17/2020 0428   RDW 15.3 (H) 08/22/2014 0447   LYMPHSABS 1.0 09/15/2020 0424   LYMPHSABS 1.7 08/22/2014 0447   MONOABS 2.4 (H) 09/15/2020 0424   MONOABS 0.8 08/22/2014 0447   EOSABS 0.2 09/15/2020 0424   EOSABS 0.4 08/22/2014 0447   BASOSABS 0.2 (H) 09/15/2020 0424   BASOSABS 0.1 08/22/2014 0447    BMET    Component Value Date/Time   NA 133 (L) 09/17/2020 0428   NA 138 08/22/2014 0447   K 4.0 09/17/2020 0428   K 4.0 08/22/2014 0447   CL 96 (L) 09/17/2020 0428   CL 100 08/22/2014 0447   CO2 23 09/17/2020 0428   CO2 30 08/22/2014 0447   GLUCOSE 408 (H) 09/17/2020 0428   GLUCOSE 88 08/22/2014 0447   BUN 33 (H) 09/17/2020 0428   BUN 14 08/22/2014 0447   CREATININE 5.97 (H) 09/17/2020 0428   CREATININE 5.00 (H) 08/22/2014 0447   CALCIUM 8.8 (L) 09/17/2020 0428   CALCIUM 8.2 (L) 08/22/2014 0447   GFRNONAA 11 (L) 09/17/2020 0428   GFRNONAA 14 (L) 08/22/2014 0447   GFRNONAA 5 (L) 06/19/2014 0937   GFRAA 7 (L) 05/30/2020 1923   GFRAA 17 (L)  08/22/2014 0447   GFRAA 6 (L) 06/19/2014 0937    INR    Component Value Date/Time   INR 1.2 07/22/2020 0618     Intake/Output Summary (Last 24 hours) at 09/18/2020 0802 Last data filed at 09/18/2020 0500 Gross per 24 hour  Intake 600 ml  Output --  Net 600 ml     Assessment/Plan:  39 y.o. male is s/p L BKA; lateral incision left open 2 Days Post-Op   No purulent or necrotic tissue noted lateral BKA incision; continue wet to dry BID packing Continue cefepime; follow up intra-op cultures Encouraged nutrition   Dagoberto Ligas, PA-C [facility] [phone] 09/18/2020 8:02 AM   I have seen and evaluated the patient. I agree with the PA note as documented above.   Marty Heck, MD [facility] [place] Office: [phone]

## 2020-09-18 NOTE — Progress Notes (Signed)
Talked to B Randol Kern, NP to notify that patient has been "banned" from cafeteria after needing to be escorted back to department due to yelling, profanity, and threatening cafeteria staff.  Patient returned to cafeteria again tonight and began yelling again, requiring security escort back to department.  Order for off-unit privileges discontinued.

## 2020-09-18 NOTE — Plan of Care (Signed)

## 2020-09-18 NOTE — TOC Initial Note (Addendum)
Transition of Care [facility]) - Initial/Assessment Note    Patient Details  Name: [name] MRN: [id number] Date of Birth: [birth year]  Transition of Care [facility]) CM/SW Contact:    Carles Collet, RN Phone Number: 09/18/2020, 2:45 PM  Clinical Narrative:        Attempted bedside assessment for discharge planning. Patient deferred stating he was on the phone with his family with an issue that was more important and he could not talk to anyone now. PT recs for SNF state patient is declining.  Unable to assess what DME patient has at home prior to admission. Per OT note patient was uncooperative during session and would not answer similair questions. Spoke w [facility] liaison who states that patient can longer be accepted to their services due to previous non compliance and behaviors. [facility] unable to accept due to staffing. [facility]- unable to take due to staffing [facility]- unable to take due to staffing      Per outpatient notes 12/03 from Dr March Rummage, podiatry, patient has been declined from multiple [facility] agencies. Prior to admission he was going to a wound care center for the wrist and Dr Price's office for wound care to his foot.        Expected Discharge Plan: Home/Self Care Barriers to Discharge: Continued Medical Work up   Patient Goals and CMS Choice        Expected Discharge Plan and Services Expected Discharge Plan: Home/Self Care   Discharge Planning Services: CM Consult                                          Prior Living Arrangements/Services   Lives with:: Parents                   Activities of Daily Living Home Assistive Devices/Equipment: None ADL Screening (condition at time of admission) Patient's cognitive ability adequate to safely complete daily activities?: Yes Is the patient deaf or have difficulty hearing?: No Does the patient have difficulty seeing, even when wearing glasses/contacts?: No Does the patient have difficulty concentrating,  remembering, or making decisions?: No Patient able to express need for assistance with ADLs?: Yes Does the patient have difficulty dressing or bathing?: No Independently performs ADLs?: Yes (appropriate for developmental age) Does the patient have difficulty walking or climbing stairs?: No Weakness of Legs: Both Weakness of Arms/Hands: None  Permission Sought/Granted                  Emotional Assessment              Admission diagnosis:  Osteomyelitis of left foot ([place]) [M86.9] Other acute osteomyelitis of left foot [facility]) [B44.967] Patient Active Problem List   Diagnosis Date Noted  . Malnutrition of moderate degree 09/13/2020  . Osteomyelitis of left foot ([place]) 09/11/2020  . Volume overload 07/22/2020  . Hypocalcemia 06/07/2020  . Diabetic ulcer of heel ([place]) 05/31/2020  . Type 2 diabetes mellitus with foot ulcer ([place]) 04/23/2020  . COVID-19 02/20/2020  . Personal history of COVID-19 02/20/2020  . Diabetic ulcer of left midfoot associated with type 1 diabetes mellitus, with fat layer exposed ([place])   . Charcot's joint of foot, left   . Pneumonia due to COVID-19 virus 02/14/2020  . Cellulitis of left foot 01/31/2020  . Allergy, unspecified, initial encounter 07/17/2019  . Diarrhea 04/24/2019  . Bone erosion determined by x-ray   .  Encephalopathy acute   . Diabetic ulcer of left midfoot associated with diabetes mellitus due to underlying condition, with necrosis of bone ([place])   . Open wound of left foot   . Encounter for orthopedic aftercare following surgical amputation   . Foot infection   . Gangrene of toe of left foot ([place])   . Cellulitis 10/26/2018  . Diabetic ulcer of left midfoot associated with diabetes mellitus due to underlying condition, with bone involvement without evidence of necrosis ([place])   . Nausea vomiting and diarrhea 10/20/2018  . DM (diabetes mellitus), secondary, uncontrolled, with neurologic complications ([place])   . Headache 09/23/2018  .  Acute osteomyelitis involving ankle and foot, left ([place])   . Anemia of chronic disease 08/18/2018  . Encounter for immunization 06/29/2018  . Hyperkalemia   . Unspecified protein-calorie malnutrition ([place]) 02/14/2018  . Encounter for removal of sutures 02/07/2018  . Fluid overload, unspecified 01/22/2018  . Osteomyelitis of ankle or foot, acute, right ([place]) 10/30/2017  . Cellulitis and abscess of toe of left foot   . Diabetic foot infection ([place])   . Insulin dependent type 1 diabetes mellitus ([place]) 10/25/2017  . Diabetic gastroparesis ([place]) 10/25/2017  . Sepsis ([place]) 10/25/2017  . Fever, unspecified 10/28/2016  . Iron deficiency anemia, unspecified 10/28/2016  . Other specified coagulation defects ([place]) 10/28/2016  . Pruritus, unspecified 10/28/2016  . Secondary hyperparathyroidism of renal origin ([place]) 10/28/2016  . Shortness of breath 10/28/2016  . HTN (hypertension) 08/09/2014  . ESRD on dialysis ([place]) 03/19/2014  . Metabolic acidosis 40/81/4481   PCP:  Kerin Perna, NP Pharmacy:   [place] ([place]), [place] - [address] [place] [address] Phone: [phone] Fax: [place], [place] - [address] [place] [address] Phone: [phone] Fax: [place] [address] [place] [address] Phone: [phone] Fax: [phone]  [id number] [place], [place] [address] [place] [address] Phone: [phone] Fax: [phone]  [facility] [phone] - [place], [place] - [place] [address] Phone: [phone] Fax: [phone]     Social Determinants of Health (SDOH) Interventions     Readmission Risk Interventions No flowsheet data found.

## 2020-09-18 NOTE — Care Management Important Message (Signed)
Important Message  Patient Details  Name: [name] MRN: [id number] Date of Birth: 1981-05-30   Medicare Important Message Given:  Yes     [name] Rosemarie Galvis 09/18/2020, 4:33 PM

## 2020-09-18 NOTE — Plan of Care (Signed)

## 2020-09-18 NOTE — Progress Notes (Signed)
Dr Cruzita Lederer notified via secure chat- in regards to the patient refusing dialysis.  The patient is shouting out loud on the telephone with someone- can be heard over the unit.  The patient is adamant about making his own choices and becomes very dramatic and loud when encouraging him to make the right choices

## 2020-09-18 NOTE — Evaluation (Signed)
Occupational Therapy Evaluation Patient Details Name: [name] MRN: [id number] DOB: Aug 08, 1981 Today's Date: 09/18/2020    History of Present Illness Patient is a 39 y/o male with PMH significant for DM Type 1, gastroparesis, HTN, ESRD, chronic L foot diabetic wound infection. Patient admitted for sepsis 2/2 acute osteomyelitis involving L ankle and foot. 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   Clinical Impression   PTA, pt lives alone and reports Independence in all ADLs, IADLs and mobility without AD prior to BKA. Pt presents now agitated and grossly uncooperative with therapy interventions. Pt difficult to redirect and unwilling to answer many questions about home setup, support at discharge, etc to facilitate safe discharge planning. With increased time and encouragement, pt able to demo stand pivot to/from [facility] using RW at Supervision level (refused physical assistance from therapist). Pt posing high fall risk as he refused to "take suggestions" at this time for safe DME use, safe hand placement, etc. Ultimately recommend, SNF for short term rehab to maximize independence and decrease fall risk. However, pt declining SNF - would benefit from 24/7 and [facility] therapy follow-up if continuing to decline SNF.     Follow Up Recommendations  Supervision/Assistance - 24 hour;SNF (HHOT if refuses SNF)    Equipment Recommendations  3 in 1 bedside commode;Other (comment);Tub/shower bench;Wheelchair (measurements OT);Wheelchair cushion (measurements OT) (RW)    Recommendations for Other Services Other (comment) (psych consult)     Precautions / Restrictions Precautions Precautions: Fall Restrictions Weight Bearing Restrictions: Yes LLE Weight Bearing: Non weight bearing      Mobility Bed Mobility Overal bed mobility: Modified Independent                  Transfers Overall transfer level: Needs assistance Equipment used: Rolling walker (2 wheeled) Transfers:  Sit to/from [facility] Sit to Stand: Supervision Stand pivot transfers: Min guard       General transfer comment: supervision for sit to stand due to patient refusing to be touched. Min guard for stand pivot due to "I want to keep you covered" with hospital gown and patient agreeable to statement. Discussed with patient about need for close proximity to him for safety. Pt refused safe hand placement education for transfers. Reports he will "take suggestions after I do what I need to do" despite this increasing fall risk    Balance Overall balance assessment: Needs assistance Sitting-balance support: No upper extremity supported;Feet supported Sitting balance-[name]: Fair     Standing balance support: Bilateral upper extremity supported;During functional activity Standing balance-[name]: Poor Standing balance comment: reliant on B UE support for dynamic tasks due to NWB                           ADL either performed or assessed with clinical judgement   ADL Overall ADL's : Needs assistance/impaired Eating/Feeding: Independent;Sitting   Grooming: Independent;Sitting   Upper Body Bathing: Set up;Sitting   Lower Body Bathing: Min guard;Sit to/from stand;Sitting/lateral leans   Upper Body Dressing : Set up;Sitting Upper Body Dressing Details (indicate cue type and reason): setup to don hospital gown long sitting in bed Lower Body Dressing: Minimal assistance;Sit to/from stand;Sitting/lateral leans Lower Body Dressing Details (indicate cue type and reason): Likely MIn A for balance in standing. Able to reach R foot in bed but would not don sock. Toilet Transfer: Engineer, materials Details (indicate cue type and reason): Refused hands on  assist, refused education for safety Toileting- Clothing Manipulation and Hygiene: Min guard;Sit to/from stand         General ADL Comments: Pt limited by balance deficits,  decreased safety awareness, and cognition vs negative behaviors     Vision Baseline Vision/History: No visual deficits Patient Visual Report: No change from baseline Vision Assessment?: No apparent visual deficits     Perception     Praxis      Pertinent Vitals/Pain Pain Assessment: Faces Faces Pain Scale: No hurt Pain Intervention(s): Monitored during session     Hand Dominance Right   Extremity/Trunk Assessment Upper Extremity Assessment Upper Extremity Assessment: Overall WFL for tasks assessed   Lower Extremity Assessment Lower Extremity Assessment: Defer to PT evaluation   Cervical / Trunk Assessment Cervical / Trunk Assessment: Normal   Communication Communication Communication: No difficulties   Cognition Arousal/Alertness: Awake/alert Behavior During Therapy: Agitated;Anxious Overall Cognitive Status: No family/caregiver present to determine baseline cognitive functioning Area of Impairment: Attention;Memory;Safety/judgement;Awareness;Problem solving                   Current Attention Level: Sustained Memory: Decreased short-term memory   Safety/Judgement: Decreased awareness of safety;Decreased awareness of deficits Awareness: Emergent Problem Solving: Requires verbal cues;Difficulty sequencing General Comments: rude and agitated, tangential speech with fixation on topics. Unable to redirect due to patient becoming agitated when interrupted   General Comments  Pt very agitated, verbally aggressive when being "interupted" during tangential conversation not relevant to task at hand. Unable to get a clear picture of home setup, support at discharge or ability to complete tasks due to pt uncooperative. Coordinated with RN for psych consult which is already placed    Exercises     Shoulder Instructions      Home Living Family/patient expects to be discharged to:: Private residence Living Arrangements: Parent Available Help at Discharge:  Family;Available PRN/intermittently Type of Home: Apartment Home Access: Stairs to enter Entrance Stairs-Number of Steps: 7 Entrance Stairs-Rails: Right       Bathroom Shower/Tub: Teacher, early years/pre: Handicapped height Bathroom Accessibility: Yes   Home Equipment: None   Additional Comments: Lives in second floor apartment      Prior Functioning/Environment Level of Independence: Needs assistance  Gait / Transfers Assistance Needed: No use of AD prior ADL's / Homemaking Assistance Needed: personal care attendant daily, unsure of how long each day. Assists with ADLs, IADLs as needed            OT Problem List: Decreased strength;Impaired balance (sitting and/or standing);Decreased safety awareness;Decreased knowledge of use of DME or AE      OT Treatment/Interventions: Self-care/ADL training;Therapeutic exercise;DME and/or AE instruction;Therapeutic activities;Patient/family education;Balance training    OT Goals(Current goals can be found in the care plan section) Acute Rehab OT Goals Patient Stated Goal: to go home OT Goal Formulation: With patient Time For Goal Achievement: 10/02/20 Potential to Achieve Goals: Good ADL Goals Pt Will Perform Lower Body Dressing: with modified independence;sitting/lateral leans;sit to/from stand Pt Will Transfer to Toilet: with modified independence;ambulating;regular height toilet Pt Will Perform Toileting - Clothing Manipulation and hygiene: with modified independence;sitting/lateral leans;sit to/from stand Additional ADL Goal #1: Pt to verbalize at least 2 fall prevention strategies to maximize safety at home  OT Frequency: Min 2X/week   Barriers to D/C:            Co-evaluation PT/OT/SLP Co-Evaluation/Treatment: Yes Reason for Co-Treatment: Necessary to address cognition/behavior during functional activity;For patient/therapist safety;To address functional/ADL transfers PT goals addressed  during session:  Mobility/safety with mobility;Balance OT goals addressed during session: ADL's and self-care      AM-PAC OT "6 Clicks" Daily Activity     Outcome Measure Help from another person eating meals?: None Help from another person taking care of personal grooming?: None Help from another person toileting, which includes using toliet, bedpan, or urinal?: A Little Help from another person bathing (including washing, rinsing, drying)?: A Little Help from another person to put on and taking off regular upper body clothing?: A Little Help from another person to put on and taking off regular lower body clothing?: A Little 6 Click Score: 20   End of Session Equipment Utilized During Treatment: Rolling walker Nurse Communication: Mobility status;Other (comment) (Psych consult needed)  Activity Tolerance: Treatment limited secondary to agitation Patient left: in bed;with call bell/phone within reach  OT Visit Diagnosis: Unsteadiness on feet (R26.81);Other abnormalities of gait and mobility (R26.89)                Time: 5361-4431 OT Time Calculation (min): 38 min Charges:  OT General Charges $OT Visit: 1 Visit OT Evaluation $OT Eval Moderate Complexity: 1 Mod  Layla Maw, OTR/L  Layla Maw 09/18/2020, 1:03 PM

## 2020-09-18 NOTE — Progress Notes (Signed)
The patient has inappropriate behavior and speaks very loudly.  The patient will refuse some medications and refuses to have his blood fingerstick glucose done.

## 2020-09-18 NOTE — Progress Notes (Signed)
Received report this am at the beginning of shift that dialysis had called and had report.  Report just given again to dialysis nurse.  The patient remains in the room and when approached reports that he is not going to dialysis today because it has interrupted his schedule with his meals.

## 2020-09-18 NOTE — Progress Notes (Signed)
PROGRESS NOTE  [name] ZCH:[id number] DOB: 21-Jul-1981 DOA: 09/10/2020 PCP: Kerin Perna, NP   LOS: 7 days   Brief Narrative / Interim history: 39 year old male with ESRD, DM 2, HTN, left foot diabetic foot ulcer who presented with left hand pain as well as erythema, swelling and warmth of all drainage of the foot.  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  He was eventually taken to the OR on 12/20-status post left BKA.  Intraoperative report shows extensive gangrenous muscle and purulent collection of the left tibia.  Follow-up intraoperative cultures, for now continue vancomycin and Unasyn.  Active Problems Left wrist abscess -Dr. Fredna Dow with hand surgery consulted and followed patient.  He was taken to the OR on 12/19 status post incision and drainage. Cultures grew staph aureus, sensitivities pending, keep on vancomycin  ESRD -nephrology consulted, continue home medications, dialysis while here  DM2, poorly controlled, with severe hyperglycemia-patient refusing intermittently interventions, refusing diabetic diet.  Continue insulin  Anemia of chronic renal insufficiency-hemoglobin stable  Pseudohyponatremia -noted  Scheduled Meds: . (feeding supplement) PROSource Plus  30 mL Oral TID BM  . calcitRIOL  2.5 mcg Oral Q M,W,F-HD  . Chlorhexidine Gluconate Cloth  6 each Topical Q0600  . cinacalcet  60 mg Oral Q breakfast  . [START ON 09/20/2020] darbepoetin (ARANESP) injection - DIALYSIS  60 mcg Intravenous Q Fri-HD  . heparin  5,000 Units Subcutaneous Q8H  . heparin sodium (porcine)  1,000 Units Intravenous Q M,W,F-HD  . insulin aspart  0-5 Units Subcutaneous QHS  . insulin aspart  0-6 Units Subcutaneous TID WC  .  insulin glargine  6 Units Subcutaneous QHS  . multivitamin  1 tablet Oral QHS  . sevelamer carbonate  4,000 mg Oral TID WC   Continuous Infusions: . ampicillin-sulbactam (UNASYN) IV    . vancomycin Stopped (09/16/20 1048)   PRN Meds:.albuterol, HYDROmorphone (DILAUDID) injection, oxyCODONE, sevelamer carbonate  Diet Orders (From admission, onward)    Start     Ordered   09/16/20 1826  Diet regular Room service appropriate? Yes; Fluid consistency: Thin  Diet effective now       Question Answer Comment  Room service appropriate? Yes   Fluid consistency: Thin      09/16/20 1825          DVT prophylaxis: SCD's Start: 09/15/20 0359 heparin injection 5,000 Units Start: 09/11/20 0600     Code Status: Full Code  Family Communication: no family at bedside   Status is: Inpatient  Remains inpatient appropriate because:Inpatient level of care appropriate due to severity of illness   Dispo: The patient is from: Home              Anticipated d/c is to: Home              Anticipated d/c date is: 2 days              Patient currently is not medically stable to d/c.  Consultants:  Orthopedic surgery Vascular surgery  Procedures:  None  Microbiology  Wrist cultures-staph aureus, diphtheroids  Antimicrobials: Vang, Unasyn   Objective: Vitals:   09/17/20 1700 09/17/20 2000 09/18/20 0500 09/18/20 0836  BP: 122/78 91/65 108/77 130/82  Pulse: 88 100 97 86  Resp: 17 17 17  16  Temp: 98.4 F (36.9 C) 98 F (36.7 C) 98.3 F (36.8 C) 98 F (36.7 C)  TempSrc: Oral Oral Oral Oral  SpO2: 98% 98% 98% 92%  Weight:      Height:        Intake/Output Summary (Last 24 hours) at 09/18/2020 1028 Last data filed at 09/18/2020 0500 Gross per 24 hour  Intake 600 ml  Output --  Net 600 ml   Filed Weights   09/13/20 1140 09/16/20 0715 09/16/20 1056  Weight: 68 kg 70 kg 67.9 kg    Examination:  Constitutional: NAD Eyes: no scleral icterus ENMT: Mucous membranes are moist.   Neck: normal, supple Respiratory: clear to auscultation bilaterally, no wheezing, no crackles.  Cardiovascular: Regular rate and rhythm, no murmurs / rubs / gallops. No LE edema.  Abdomen: non distended, no tenderness. Bowel sounds positive.  Musculoskeletal: no clubbing / cyanosis.  Neurologic: non focal  Psychiatric: Normal judgment and insight. Alert and oriented x 3. Normal mood.    Data Reviewed: I have independently reviewed following labs and imaging studies   CBC: Recent Labs  Lab 09/12/20 0330 09/13/20 0123 09/14/20 0204 09/14/20 2303 09/15/20 0424 09/16/20 0216 09/16/20 1431 09/17/20 0428  WBC 23.1* 26.7* 26.3*  --  24.2* 24.7*  --  31.4*  NEUTROABS 20.7* 22.9* 20.0*  --  20.3*  --   --   --   HGB 9.9* 9.7* 9.5* 8.5* 8.5* 9.3* 9.9* 8.0*  HCT 32.2* 29.0* 29.6* 25.0* 27.7* 29.3* 29.0* 25.6*  MCV 92.0 88.1 89.4  --  92.0 88.8  --  91.4  PLT 204 201 191  --  213 205  --  258   Basic Metabolic Panel: Recent Labs  Lab 09/11/20 1453 09/12/20 0330 09/13/20 0123 09/14/20 0204 09/14/20 2303 09/15/20 0424 09/16/20 0807 09/16/20 1431 09/16/20 2159 09/17/20 0428  NA 129* 133* 127* 130*   < > 128* 126* 134* 135 133*  K 5.4* 3.6 4.1 4.1   < > 3.6 4.1 3.3* 3.9 4.0  CL 89* 89* 87* 90*   < > 92* 87* 100 96* 96*  CO2 16* 22 25 28   --  22 20*  --  23 23  GLUCOSE 380* 189* 177* 241*   < > 327* 441* 38* 298* 408*  BUN 59* 31* 45* 32*   < > 46* 60* 27* 31* 33*  CREATININE 9.90* 6.07* 6.99* 5.04*   < > 6.66* 7.70* 4.60* 5.24* 5.97*  CALCIUM 8.5* 8.5* 9.1 9.3  --  8.8* 9.0  --  8.8* 8.8*  MG 2.5* 2.4 2.3 2.3  --  2.3  --   --   --   --   PHOS 7.8* 7.2* 5.4* 4.7*  --  4.8* 5.0*  --   --  4.5   < > = values in this interval not displayed.   Liver Function Tests: Recent Labs  Lab 09/11/20 1453 09/12/20 0330 09/13/20 0123 09/14/20 0204 09/15/20 0424 09/16/20 0807 09/17/20 0428  AST 15 17 17 15  14*  --   --   ALT 12 12 13 13 14   --   --   ALKPHOS 171* 179* 164* 153*  150*  --   --   BILITOT 2.6* 1.6* 1.1 1.1 0.4  --   --   PROT 6.5 6.8 6.8 6.4* 6.0*  --   --   ALBUMIN 2.1* 2.0* 2.0* 1.7* 1.6* 1.8* 2.0*   Coagulation Profile: No results for input(s): INR, PROTIME in  the last 168 hours. HbA1C: No results for input(s): HGBA1C in the last 72 hours. CBG: Recent Labs  Lab 09/15/20 2048 09/16/20 0640 09/16/20 1631 09/16/20 1652 09/16/20 2037  GLUCAP 268* 375* 116* 117* 170*    Recent Results (from the past 240 hour(s))  Blood culture (routine x 2)     Status: None   Collection Time: 09/10/20  4:56 PM   Specimen: BLOOD  Result Value Ref Range Status   Specimen Description BLOOD SITE NOT SPECIFIED  Final   Special Requests   Final    BOTTLES DRAWN AEROBIC AND ANAEROBIC Blood Culture adequate volume   Culture   Final    NO GROWTH 5 DAYS Performed at [facility], [address], [place], [place] [address]    Report Status 09/15/2020 FINAL  Final  Resp Panel by RT-PCR (Flu A&B, Covid) Nasopharyngeal Swab     Status: None   Collection Time: 09/11/20  4:41 AM   Specimen: Nasopharyngeal Swab; Nasopharyngeal(NP) swabs in vial transport medium  Result Value Ref Range Status   SARS Coronavirus 2 by RT PCR NEGATIVE NEGATIVE Final    Comment: (NOTE) SARS-CoV-2 target nucleic acids are NOT DETECTED.  The SARS-CoV-2 RNA is generally detectable in upper respiratory specimens during the acute phase of infection. The lowest concentration of SARS-CoV-2 viral copies this assay can detect is 138 copies/mL. A negative result does not preclude SARS-Cov-2 infection and should not be used as the sole basis for treatment or other patient management decisions. A negative result may occur with  improper specimen collection/handling, submission of specimen other than nasopharyngeal swab, presence of viral mutation(s) within the areas targeted by this assay, and inadequate number of viral copies(<138 copies/mL). A negative result must be combined  with clinical observations, patient history, and epidemiological information. The expected result is Negative.  Fact Sheet for Patients:  EntrepreneurPulse.com.au  Fact Sheet for Healthcare Providers:  IncredibleEmployment.be  This test is no t yet approved or cleared by the [place] FDA and  has been authorized for detection and/or diagnosis of SARS-CoV-2 by FDA under an Emergency Use Authorization (EUA). This EUA will remain  in effect (meaning this test can be used) for the duration of the COVID-19 declaration under Section 564(b)(1) of the Act, 21 U.S.C.section 360bbb-3(b)(1), unless the authorization is terminated  or revoked sooner.       Influenza A by PCR NEGATIVE NEGATIVE Final   Influenza B by PCR NEGATIVE NEGATIVE Final    Comment: (NOTE) The Xpert Xpress SARS-CoV-2/FLU/RSV plus assay is intended as an aid in the diagnosis of influenza from Nasopharyngeal swab specimens and should not be used as a sole basis for treatment. Nasal washings and aspirates are unacceptable for Xpert Xpress SARS-CoV-2/FLU/RSV testing.  Fact Sheet for Patients: EntrepreneurPulse.com.au  Fact Sheet for Healthcare Providers: IncredibleEmployment.be  This test is not yet approved or cleared by the [place] FDA and has been authorized for detection and/or diagnosis of SARS-CoV-2 by FDA under an Emergency Use Authorization (EUA). This EUA will remain in effect (meaning this test can be used) for the duration of the COVID-19 declaration under Section 564(b)(1) of the Act, 21 U.S.C. section 360bbb-3(b)(1), unless the authorization is terminated or revoked.  Performed at [facility], [place] [address], [place], [place] [address]   Blood culture (routine x 2)     Status: None (Preliminary result)   Collection Time: 09/13/20  1:23 AM   Specimen: BLOOD  Result Value Ref Range Status  Specimen Description  BLOOD RIGHT ANTECUBITAL  Final   Special Requests   Final    BOTTLES DRAWN AEROBIC AND ANAEROBIC Blood Culture adequate volume   Culture   Final    NO GROWTH 4 DAYS Performed at [facility], [address], [place], [place] [address]    Report Status PENDING  Incomplete  Aerobic/Anaerobic Culture (surgical/deep wound)     Status: None (Preliminary result)   Collection Time: 09/15/20  1:26 AM   Specimen: Wound; Abscess  Result Value Ref Range Status   Specimen Description WOUND LEFT WRIST  Final   Special Requests PT ON VANCOMYCIN  Final   Gram Stain NO WBC SEEN NO ORGANISMS SEEN   Final   Culture   Final    MODERATE STAPHYLOCOCCUS AUREUS ABUNDANT DIPHTHEROIDS(CORYNEBACTERIUM SPECIES) SUSCEPTIBILITIES TO FOLLOW Performed at [facility], [address] [place], [place] [address]    Report Status PENDING  Incomplete  MRSA PCR Screening     Status: None   Collection Time: 09/16/20  5:08 AM   Specimen: Nasal Mucosa; Nasopharyngeal  Result Value Ref Range Status   MRSA by PCR NEGATIVE NEGATIVE Final    Comment:        The GeneXpert MRSA Assay (FDA approved for NASAL specimens only), is one component of a comprehensive MRSA colonization surveillance program. It is not intended to diagnose MRSA infection nor to guide or monitor treatment for MRSA infections. Performed at [facility], [place] [address], [place], [place] [address]   Aerobic Culture (superficial specimen)     Status: None (Preliminary result)   Collection Time: 09/16/20  3:49 PM   Specimen: Leg, Left; Wound  Result Value Ref Range Status   Specimen Description WOUND LEFT LEG  Final   Special Requests PT ON CEFEPIME LEFT LEG ANTERIOR COMPARTMENT  Final   Gram Stain NO WBC SEEN NO ORGANISMS SEEN   Final   Culture   Final    TOO YOUNG TO READ Performed at [facility], [address], [place],  [address]    Report Status PENDING  Incomplete     Radiology Studies: No results  found.   Marzetta Board, MD, PhD [facility]  Between 7 am - 7 pm I am available, please contact me via Amion or Securechat  Between 7 pm - 7 am I am not available, please contact night coverage MD/APP via Amion

## 2020-09-18 NOTE — Consult Note (Signed)
[facility] Face-to-Face Psychiatry Consult   Reason for Consult:  Erratic  Referring Physician:  Darrall Dears, MD Patient Identification: [name] MRN:  [id number] Principal Diagnosis: Acute osteomyelitis involving ankle and foot, left ([place]) Diagnosis:  Principal Problem:   Acute osteomyelitis involving ankle and foot, left ([place]) Active Problems:   Insulin dependent type 1 diabetes mellitus ([place])   ESRD on dialysis ([place])   HTN (hypertension)   Sepsis ([place])   Diabetic ulcer of left midfoot associated with diabetes mellitus due to underlying condition, with bone involvement without evidence of necrosis ([place])   Osteomyelitis of left foot ([place])   Malnutrition of moderate degree   Total Time spent with patient: 40 minutes  Subjective:   [name] is a 39 y.o. male patient admitted with  ESRD, DM, HTN, and left foot DFU who presented with left hand pain as well as erythema, swelling, and warmth as well as foul drainage of the foot.   HPI:  [name] is a 39 year old male with a medical history significant for DM Type 1, gastroparesis, HTN, ESRD, chronic L foot diabetic wound infection. Patient admitted for sepsis 2/2 acute osteomyelitis involving L ankle and foot. 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  Today, patient was seen in his room at [facility], chart reviewed and case discussed with Dr Dwyane Dee and patient's attending MD. Patient denied any need for mental health services and denied any history of mental health problems. Psychiatry was consulted for "question of bipolar mania, patient very erratic and irritable." On examination patient is alert and oriented x4. He is irritable and angry at everything going on in his life, including doctors and staff at the hospital. He stated "they want me to do whatever they say, like they know me like that, they don't know me and I can choose what I want to do".  He was dismissive when this Probation officer first entered the  room and stated "I don't need any help from you and I am on the phone with the mother of my children". He then rambled on and on  He denied any history, past or present, of mental health diagnoses or hospitalizations. He went on to state he would contact mental health providers if he changed his mind. He denies suicidal and homicidal ideation, auditory and visual hallucinations and paranoia. His speech is rapid and pressured, his behavior is dismissive and irritable.  Patient does not meet criteria for inpatient psychiatric hospitalization.    Past Psychiatric History: None, Patient denies  Risk to Self:  No Risk to Others:  No Prior Inpatient Therapy:  No Prior Outpatient Therapy:  No  Past Medical History:  Past Medical History:  Diagnosis Date  . Anemia   . Diabetes mellitus without complication ([place])   . Diabetic gastroparesis ([place])   . Dialysis patient ([place])   . Hypertension   . Renal disorder    Dialysis  . Sepsis [facility])     Past Surgical History:  Procedure Laterality Date  . AMPUTATION Right 02/02/2018   Procedure: RIGHT FIFTH TOE AND METATARSAL AMPUTATION. Filetted toe flap metatarsal resection. Debridement Plantar Foot wound;  Surgeon: Evelina Bucy, DPM;  Location: [place];  Service: Podiatry;  Laterality: Right;  . AMPUTATION Left 08/20/2018   Procedure: FIFTH METATARSAL BONE BIOPSY;  Surgeon: Evelina Bucy, DPM;  Location: [place];  Service: Podiatry;  Laterality: Left;  . AMPUTATION Left 10/28/2018   Procedure: LEFT GREAT  TOE AMPUTATION;  Surgeon: Evelina Bucy, DPM;  Location: [place];  Service: Podiatry;  Laterality: Left;  . AMPUTATION Left 09/16/2020   Procedure: AMPUTATION BELOW KNEE;  Surgeon: Rosetta Posner, MD;  Location: [place];  Service: Vascular;  Laterality: Left;  . APPLICATION OF WOUND VAC  02/02/2018   Procedure: APPLICATION OF WOUND VAC  Right Foot;  Surgeon: Evelina Bucy, DPM;  Location: [place];  Service: Podiatry;;  . APPLICATION OF WOUND VAC Left  10/28/2018   Procedure: APPLICATION OF WOUND VAC LEFT TOE;  Surgeon: Evelina Bucy, DPM;  Location: [place];  Service: Podiatry;  Laterality: Left;  . APPLICATION OF WOUND VAC Left 11/01/2018   Procedure: APPLICATION OF WOUND VAC;  Surgeon: Evelina Bucy, DPM;  Location: [place];  Service: Podiatry;  Laterality: Left;  . AV FISTULA PLACEMENT     left arm.  . AV FISTULA PLACEMENT Right 12/22/2016   Procedure: INSERTION OF ARTERIOVENOUS (AV) GORE-TEX GRAFT ARM;  Surgeon: Elam Dutch, MD;  Location[facility];  Service: Vascular;  Laterality: Right;  . AV FISTULA PLACEMENT Left 05/26/2018   Procedure: INSERTION OF  ARTERIOVENOUS (AV) GORE-TEX GRAFT LEFT ARM;  Surgeon: Serafina Mitchell, MD;  Location: [place];  Service: Vascular;  Laterality: Left;  . EYE SURGERY    . I & D EXTREMITY Right 10/31/2017   Procedure: IRRIGATION AND DEBRIDEMENT RIGHT FOOT;  Surgeon: Evelina Bucy, DPM;  Location: [place];  Service: Podiatry;  Laterality: Right;  . I & D EXTREMITY Left 08/20/2018   Procedure: IRRIGATION AND DEBRIDEMENT EXTREMITY WITH SECONDARY WOUND CLOSUREAND APPLICATION OF WOUND VAC LEFT FOOT;  Surgeon: Evelina Bucy, DPM;  Location: [place];  Service: Podiatry;  Laterality: Left;  . I & D EXTREMITY Left 10/20/2018   Procedure: IRRIGATION AND DEBRIDEMENT LEFT FOOT  DEBRIDEMENT LATERAL FOOT WOUND;  Surgeon: Evelina Bucy, DPM;  Location: [place];  Service: Podiatry;  Laterality: Left;  . I & D EXTREMITY Left 10/28/2018   Procedure: IRRIGATION AND DEBRIDEMENT LEFT TOE;  Surgeon: Evelina Bucy, DPM;  Location: [place];  Service: Podiatry;  Laterality: Left;  . I & D EXTREMITY Left 09/14/2020   Procedure: IRRIGATION AND DEBRIDEMENT WRIST;  Surgeon: Leanora Cover, MD;  Location: [place];  Service: Orthopedics;  Laterality: Left;  . INSERTION OF DIALYSIS CATHETER     Right subclavian  . IR AV DIALY SHUNT INTRO NEEDLE/INTRACATH INITIAL W/PTA/IMG RIGHT Right 02/05/2018  . IR FLUORO GUIDE CV LINE RIGHT  01/31/2020  .  IR THROMBECTOMY AV FISTULA W/THROMBOLYSIS/PTA INC/SHUNT/IMG LEFT Left 08/24/2018  . IR THROMBECTOMY AV FISTULA W/THROMBOLYSIS/PTA INC/SHUNT/IMG LEFT Left 01/06/2019  . IR US GUIDE VASC ACCESS LEFT  08/24/2018  . IR US GUIDE VASC ACCESS RIGHT  02/05/2018  . IR US GUIDE VASC ACCESS RIGHT  01/31/2020  . IRRIGATION AND DEBRIDEMENT FOOT Right 10/23/2018   Procedure: Irrigation and Debridement to tendon, Left Foot;  Surgeon: Evelina Bucy, DPM;  Location: [place];  Service: Podiatry;  Laterality: Right;  . IRRIGATION AND DEBRIDEMENT FOOT Left 11/01/2018   Procedure: IRRIGATION AND DEBRIDEMENT PARTIAL WOUND CLOSURE LOCAL TISSUE TRANSFER AND FLAP ROTATION, LEFT FOOT;  Surgeon: Evelina Bucy, DPM;  Location: [place];  Service: Podiatry;  Laterality: Left;  . TRANSMETATARSAL AMPUTATION N/A 08/18/2018   Procedure: IRRIGATION AND DEBRIDEMENT OF LEFT 5TH TOE AND TRANSMETATARSAL, WITH PARTICAL LEFT 5TH TOE AND METATARSAL AMPUTATION, BONE BIOPSY, WOUND VAC APPLICATION.;  Surgeon: Evelina Bucy, DPM;  Location:  [place];  Service: Podiatry;  Laterality: N/A;  . UPPER EXTREMITY VENOGRAPHY N/A 11/16/2016   Procedure: Upper Extremity Venography - Right Central;  Surgeon: Elam Dutch, MD;  Location: [place] [facility];  Service: Cardiovascular;  Laterality: N/A;  . UPPER EXTREMITY VENOGRAPHY N/A 05/25/2018   Procedure: UPPER EXTREMITY VENOGRAPHY - Bilateral;  Surgeon: Marty Heck, MD;  Location: [place] [facility];  Service: Cardiovascular;  Laterality: N/A;   Family History:  Family History  Problem Relation Age of Onset  . Diabetes Mellitus II Other   . Diabetes Father   . Renal Disease Father        ESRD   Family Psychiatric  History: Unknown Social History:  Social History   Substance and Sexual Activity  Alcohol Use No     Social History   Substance and Sexual Activity  Drug Use No    Social History   Socioeconomic History  . Marital status: Single    Spouse name: Not on file  .  Number of children: Not on file  . Years of education: Not on file  . Highest education level: Not on file  Occupational History  . Not on file  Tobacco Use  . Smoking status: Never Smoker  . Smokeless tobacco: Never Used  Vaping Use  . Vaping Use: Never used  Substance and Sexual Activity  . Alcohol use: No  . Drug use: No  . Sexual activity: Never  Other Topics Concern  . Not on file  Social History Narrative  . Not on file   Social Determinants of Health   Financial Resource Strain: Not on file  Food Insecurity: Not on file  Transportation Needs: Not on file  Physical Activity: Not on file  Stress: Not on file  Social Connections: Not on file   Additional Social History:    Allergies:  No Known Allergies  Labs:  Results for orders placed or performed during the hospital encounter of 09/10/20 (from the past 48 hour(s))  Surgical pathology     Status: None   Collection Time: 09/16/20  4:08 PM  Result Value Ref Range   SURGICAL PATHOLOGY      SURGICAL PATHOLOGY CASE: [id number] PATIENT: Charm Barges Surgical Pathology Report     Clinical History: critical lower limb ischemia (cm)     FINAL MICROSCOPIC DIAGNOSIS:  A. LEG, LEFT BELOW KNEE, AMPUTATION: - Benign skin and soft tissue with acute inflammation, ulcer, and necrosis. - Resection margins are grossly viable. - Moderate atherosclerosis.   GROSS DESCRIPTION:  Received fresh is a 35 cm left below the knee amputation with 4 cm of fibula and 3 cm of tibia exposed at the soft tissue margin.  The skin and soft tissue the margin of resection grossly appears viable.  The great toe is previously amputated and the amputation site is well-healed.  There is a 5 x 4 cm ulcer present on the plantar surface of the foot.  There is skin sloughing over the medial lower leg.  There is mild to moderate atherosclerosis of the posterior tibial artery, peroneal artery and anterior tibial artery.  The arteries are  calcified. Sections are submitted in 4 cassett es following decalcification of the arteries. 1 = skin 2 = posterior tibial artery 3 = peroneal artery 4 = anterior tibial artery (GRP 09/17/2020)   Final Diagnosis performed by Vicente Males, MD.   Electronically signed 09/18/2020 Technical component performed at [facility]. [facility], [place] [address] [place], [place]  [address].  Professional component performed at [facility], [place] [address], [place], [place] [address].  Immunohistochemistry Technical component (if applicable) was performed at [facility]. [address], [place], [place], [place] [address].   IMMUNOHISTOCHEMISTRY DISCLAIMER (if applicable): Some of these immunohistochemical stains may have been developed and the performance characteristics determine by [facility]. Some may not have been cleared or approved by the U.S. Food and Drug Administration. The FDA has determined that such clearance or approval is not necessary. This test is used  for clinical purposes. It should not be regarded as investigational or for research. This laboratory is certified under the [place] (CLIA-88) as qualified to perform high complexity clinical laboratory testing.  The controls stained appropriately.   Glucose, capillary     Status: Abnormal   Collection Time: 09/16/20  4:31 PM  Result Value Ref Range   Glucose-Capillary 116 (H) 70 - 99 mg/dL    Comment: Glucose reference range applies only to samples taken after fasting for at least 8 hours.  Glucose, capillary     Status: Abnormal   Collection Time: 09/16/20  4:52 PM  Result Value Ref Range   Glucose-Capillary 117 (H) 70 - 99 mg/dL    Comment: Glucose reference range applies only to samples taken after fasting for at least 8 hours.  Glucose, capillary     Status: Abnormal   Collection Time: 09/16/20  8:37 PM  Result Value Ref  Range   Glucose-Capillary 170 (H) 70 - 99 mg/dL    Comment: Glucose reference range applies only to samples taken after fasting for at least 8 hours.  Basic metabolic panel     Status: Abnormal   Collection Time: 09/16/20  9:59 PM  Result Value Ref Range   Sodium 135 135 - 145 mmol/L   Potassium 3.9 3.5 - 5.1 mmol/L   Chloride 96 (L) 98 - 111 mmol/L   CO2 23 22 - 32 mmol/L   Glucose, Bld 298 (H) 70 - 99 mg/dL    Comment: Glucose reference range applies only to samples taken after fasting for at least 8 hours.   BUN 31 (H) 6 - 20 mg/dL   Creatinine, Ser 5.24 (H) 0.61 - 1.24 mg/dL   Calcium 8.8 (L) 8.9 - 10.3 mg/dL   GFR, Estimated 13 (L) >60 mL/min    Comment: (NOTE) Calculated using the CKD-EPI Creatinine Equation (2021)    Anion gap 16 (H) 5 - 15    Comment: Performed at [facility] [address], [place], [place] [address]  CBC     Status: Abnormal   Collection Time: 09/17/20  4:28 AM  Result Value Ref Range   WBC 31.4 (H) 4.0 - 10.5 K/uL   RBC 2.80 (L) 4.22 - 5.81 MIL/uL   Hemoglobin 8.0 (L) 13.0 - 17.0 g/dL   HCT 25.6 (L) 39.0 - 52.0 %   MCV 91.4 80.0 - 100.0 fL   MCH 28.6 26.0 - 34.0 pg   MCHC 31.3 30.0 - 36.0 g/dL   RDW 15.2 11.5 - 15.5 %   Platelets 231 150 - 400 K/uL   nRBC 0.0 0.0 - 0.2 %    Comment: Performed at [facility], [place] [address] [place], [place] [address]  Renal function panel     Status: Abnormal   Collection Time: 09/17/20  4:28 AM  Result Value Ref Range   Sodium 133 (L) 135 - 145 mmol/L  Potassium 4.0 3.5 - 5.1 mmol/L   Chloride 96 (L) 98 - 111 mmol/L   CO2 23 22 - 32 mmol/L   Glucose, Bld 408 (H) 70 - 99 mg/dL    Comment: Glucose reference range applies only to samples taken after fasting for at least 8 hours.   BUN 33 (H) 6 - 20 mg/dL   Creatinine, Ser 5.97 (H) 0.61 - 1.24 mg/dL   Calcium 8.8 (L) 8.9 - 10.3 mg/dL   Phosphorus 4.5 2.5 - 4.6 mg/dL   Albumin 2.0 (L) 3.5 - 5.0 g/dL   GFR, Estimated 11 (L) >60 mL/min     Comment: (NOTE) Calculated using the CKD-EPI Creatinine Equation (2021)    Anion gap 14 5 - 15    Comment: Performed at [facility] [address], [place], [place] [address]    Current Facility-Administered Medications  Medication Dose Route Frequency Provider Last Rate Last Admin  . (feeding supplement) PROSource Plus liquid 30 mL  30 mL Oral TID BM Dagoberto Ligas, PA-C   30 mL at 09/17/20 2000  . albuterol (VENTOLIN HFA) 108 (90 Base) MCG/ACT inhaler 2 puff  2 puff Inhalation Q6H PRN Dagoberto Ligas, PA-C      . Ampicillin-Sulbactam (UNASYN) 3 g in sodium chloride 0.9 % 100 mL IVPB  3 g Intravenous Q12H Susa Raring, RPH 200 mL/hr at 09/18/20 0900 3 g at 09/18/20 0900  . calcitRIOL (ROCALTROL) capsule 2.5 mcg  2.5 mcg Oral Q M,W,F-HD Dagoberto Ligas, PA-C   2.5 mcg at 09/13/20 1121  . Chlorhexidine Gluconate Cloth 2 % PADS 6 each  6 each Topical Q0600 Dagoberto Ligas, PA-C   6 each at 09/16/20 [phone]  . cinacalcet (SENSIPAR) tablet 60 mg  60 mg Oral Q breakfast Dagoberto Ligas, PA-C   60 mg at 09/11/20 0741  . [START ON 09/20/2020] Darbepoetin Alfa (ARANESP) injection 60 mcg  60 mcg Intravenous Q Fri-HD Dagoberto Ligas, PA-C      . heparin injection 5,000 Units  5,000 Units Subcutaneous Q8H Dagoberto Ligas, PA-C   5,000 Units at 09/17/20 1950  . heparin sodium (porcine) injection 1,000 Units  1,000 Units Intravenous Q M,W,F-HD Dagoberto Ligas, PA-C   3,800 Units at 09/16/20 1045  . HYDROmorphone (DILAUDID) injection 1 mg  1 mg Intravenous Q2H PRN Dagoberto Ligas, PA-C   1 mg at 09/18/20 0433  . insulin aspart (novoLOG) injection 0-5 Units  0-5 Units Subcutaneous QHS Dagoberto Ligas, PA-C   1 Units at 09/18/20 0900  . insulin aspart (novoLOG) injection 0-6 Units  0-6 Units Subcutaneous TID WC Dagoberto Ligas, PA-C   3 Units at 09/15/20 9326  . insulin glargine (LANTUS) injection 6 Units  6 Units Subcutaneous QHS Dagoberto Ligas, PA-C   6 Units at 09/16/20 2038  .  multivitamin (RENA-VIT) tablet 1 tablet  1 tablet Oral QHS Gherghe, Costin M, MD      . oxyCODONE (Oxy IR/ROXICODONE) immediate release tablet 5 mg  5 mg Oral Q4H PRN Dagoberto Ligas, PA-C   5 mg at 09/18/20 0606  . sevelamer carbonate (RENVELA) tablet 2,400 mg  2,400 mg Oral PRN Dagoberto Ligas, PA-C   2,400 mg at 09/11/20 0740  . sevelamer carbonate (RENVELA) tablet 4,000 mg  4,000 mg Oral TID WC Dagoberto Ligas, PA-C   4,000 mg at 09/18/20 1206  . vancomycin (VANCOCIN) IVPB 750 mg/150 ml premix  750 mg Intravenous Q M,W,F-HD Dagoberto Ligas, PA-C   Stopped at 09/16/20 1048    Musculoskeletal: Strength &  Muscle Tone: within normal limits Gait & Station: not tested, left BKA Patient leans: N/A  Psychiatric Specialty Exam: Physical Exam Vitals and nursing note reviewed.  Constitutional:      Appearance: Normal appearance.  HENT:     Head: Normocephalic.  Pulmonary:     Effort: Pulmonary effort is normal.  Musculoskeletal:        General: Normal range of motion.     Cervical back: Normal range of motion.  Neurological:     Mental Status: He is alert and oriented to person, place, and time.  Psychiatric:        Attention and Perception: Attention and perception normal.        Mood and Affect: Mood is anxious.        Speech: Speech is rapid and pressured.        Behavior: Behavior normal. Behavior is cooperative.        Thought Content: Thought content normal.        Cognition and Memory: Cognition normal.     Review of Systems  Constitutional: Negative for activity change and appetite change.  Respiratory: Negative for chest tightness and shortness of breath.   Cardiovascular: Negative for chest pain.  Gastrointestinal: Negative for abdominal pain.  Neurological: Negative for facial asymmetry and headaches.   Blood pressure 130/82, pulse 86, temperature 98 F (36.7 C), temperature source Oral, resp. rate 16, height 5\' 7"  (1.702 m), weight 67.9 kg, SpO2 92 %.Body mass index  is 23.45 kg/m.  General Appearance: Casual  Eye Contact:  Good  Speech:  Pressured  Volume:  Normal  Mood:  Angry, Irritable and dismissive  Affect:  Congruent  Thought Process:  Coherent, Goal Directed and Descriptions of Associations: Intact  Orientation:  Full (Time, Place, and Person)  Thought Content:  Logical and Hallucinations: None  Suicidal Thoughts:  No  Homicidal Thoughts:  No  Memory:  Immediate;   Good Recent;   Good Remote;   not tested  Judgement:  Intact  Insight:  Lacking  Psychomotor Activity:  Normal  Concentration:  Concentration: Fair and Attention Span: Fair  Recall:  Good  Fund of Knowledge:  Good  Language:  Good  Akathisia:  No  Handed:  Right  AIMS (if indicated):     Assets:  Agricultural consultant Housing Resilience Social Support  ADL's:  Intact  Cognition:  WNL  Sleep:        Disposition:  - Patient does not meet criteria for inpatient psychiatric hospitalization  -Please provide patient with mental health in the community when he is ready for discharge.    Ethelene Hal, NP 09/18/2020 4:07 PM

## 2020-09-18 NOTE — Progress Notes (Signed)
[facility] Progress Note   Subjective: very tangential today.  For HD today but has refused x 2 already.     Objective Vitals:   09/17/20 1700 09/17/20 2000 09/18/20 0500 09/18/20 0836  BP: 122/78 91/65 108/77 130/82  Pulse: 88 100 97 86  Resp: 17 17 17 16   Temp: 98.4 F (36.9 C) 98 F (36.7 C) 98.3 F (36.8 C) 98 F (36.7 C)  TempSrc: Oral Oral Oral Oral  SpO2: 98% 98% 98% 92%  Weight:      Height:       Physical Exam General: Chronically ill appearing male in NAD Heart: S1,S2 RRR no M/R/G Lungs: CTAB Abdomen: S, NT Extremities: No LE edema. L BKA dressed.  Pictures viewed in Epic Dialysis Access: [facility] c/d/i   Additional Objective Labs: Basic Metabolic Panel: Recent Labs  Lab 09/15/20 0424 09/16/20 0807 09/16/20 1431 09/16/20 2159 09/17/20 0428  NA 128* 126* 134* 135 133*  K 3.6 4.1 3.3* 3.9 4.0  CL 92* 87* 100 96* 96*  CO2 22 20*  --  23 23  GLUCOSE 327* 441* 38* 298* 408*  BUN 46* 60* 27* 31* 33*  CREATININE 6.66* 7.70* 4.60* 5.24* 5.97*  CALCIUM 8.8* 9.0  --  8.8* 8.8*  PHOS 4.8* 5.0*  --   --  4.5   Liver Function Tests: Recent Labs  Lab 09/13/20 0123 09/14/20 0204 09/15/20 0424 09/16/20 0807 09/17/20 0428  AST 17 15 14*  --   --   ALT 13 13 14   --   --   ALKPHOS 164* 153* 150*  --   --   BILITOT 1.1 1.1 0.4  --   --   PROT 6.8 6.4* 6.0*  --   --   ALBUMIN 2.0* 1.7* 1.6* 1.8* 2.0*   No results for input(s): LIPASE, AMYLASE in the last 168 hours. CBC: Recent Labs  Lab 09/13/20 0123 09/14/20 0204 09/14/20 2303 09/15/20 0424 09/16/20 0216 09/16/20 1431 09/17/20 0428  WBC 26.7* 26.3*  --  24.2* 24.7*  --  31.4*  NEUTROABS 22.9* 20.0*  --  20.3*  --   --   --   HGB 9.7* 9.5*   < > 8.5* 9.3* 9.9* 8.0*  HCT 29.0* 29.6*   < > 27.7* 29.3* 29.0* 25.6*  MCV 88.1 89.4  --  92.0 88.8  --  91.4  PLT 201 191  --  213 205  --  231   < > = values in this interval not displayed.   Blood Culture    Component Value Date/Time    SDES WOUND LEFT LEG 09/16/2020 1549   SPECREQUEST PT ON CEFEPIME LEFT LEG ANTERIOR COMPARTMENT 09/16/2020 1549   CULT  09/16/2020 1549    CULTURE REINCUBATED FOR BETTER GROWTH Performed at [facility], [place] [address], [place], [place] [address]    REPTSTATUS PENDING 09/16/2020 1549    Cardiac Enzymes: No results for input(s): CKTOTAL, CKMB, CKMBINDEX, TROPONINI in the last 168 hours. CBG: Recent Labs  Lab 09/15/20 2048 09/16/20 0640 09/16/20 1631 09/16/20 1652 09/16/20 2037  GLUCAP 268* 375* 116* 117* 170*   Iron Studies: No results for input(s): IRON, TIBC, TRANSFERRIN, FERRITIN in the last 72 hours. @lablastinr3 @ Studies/Results: No results found. Medications: . ampicillin-sulbactam (UNASYN) IV 3 g (09/18/20 0900)  . vancomycin Stopped (09/16/20 1048)   . (feeding supplement) PROSource Plus  30 mL Oral TID BM  . calcitRIOL  2.5 mcg Oral Q M,W,F-HD  . Chlorhexidine Gluconate  Cloth  6 each Topical [id number]  . cinacalcet  60 mg Oral Q breakfast  . [START ON 09/20/2020] darbepoetin (ARANESP) injection - DIALYSIS  60 mcg Intravenous Q Fri-HD  . heparin  5,000 Units Subcutaneous Q8H  . heparin sodium (porcine)  1,000 Units Intravenous Q M,W,F-HD  . insulin aspart  0-5 Units Subcutaneous QHS  . insulin aspart  0-6 Units Subcutaneous TID WC  . insulin glargine  6 Units Subcutaneous QHS  . multivitamin  1 tablet Oral QHS  . sevelamer carbonate  4,000 mg Oral TID WC     Dialysis Orders: [facility] on MWFon 4 hours 15 minutes. EPP29 kgHD Bath2K, 2 calcium bath, Heparin5000 units IV initial then 5000 units IV-midrun Calcitriol 3.0 MCG p.o. q. Dialysis no ESA  Assessment/Plan: 1.ESRD -HD MWF.K 3.6.HD today on schedule.OPVVShad scheduled forR BC AVF12/28/2021. 2. Left footwound withosteomyelitis-S/P L BKA 12/20 per Dr. Donnetta Hutching. Patient not dealing with this well. PT consulted. Continue Vanc & Cefepime.  3. L wrist lesion - s/p I&D. No gross purulence.  Removal of indurated SQ tissue. Culture sent for micro-NGTD. Wound packed. Plan to start hydrotherapy in 3-4 days. 4. Hypertension/volume -HD 12/20 Net UF 2 liters. Post wt 67.9 kg. UF as tolerated tomorrow. Lower EDW as tolerated.  5. Anemiaof ESRD-Hgb8.0 Aranesp 46mcg given 12/17, will increase dose next week to68mcg qwk d/t planned surgery and downward trend.Ferritin 3890, no FE.Transfuse as needed.  6. Metabolic bone disease -Ca and phos at goal. Continue p.o. calcitriolon HD,Renvelaand sensipar 7. Nutrition -carb modified renal protein supplementAlbumin2.0added protein supplement, renal vits 8. Diabetes mellitus type1-per admit  Madelon Lips MD 09/18/2020, 3:17 PM  [place] [phone]

## 2020-09-18 NOTE — Progress Notes (Signed)
A family member called today and reported that the home situation for home discharge is going to be difficult since the patient has BKA.  She reports there are stairs and that she is not prepared for the patient to come home.

## 2020-09-18 NOTE — Progress Notes (Signed)
Patient using bedside commode; Also stated " Have not eaten yet." Not right now for line care.

## 2020-09-18 NOTE — Progress Notes (Signed)
Patient refused HD today. Transport went to him 3x. And HD charge nurse called floor nurse that pt will be dialyze tonight.

## 2020-09-18 NOTE — Progress Notes (Signed)
Physical Therapy Treatment Patient Details Name: [name] MRN: [id number] DOB: 09-04-1981 Today's Date: 09/18/2020    History of Present Illness Patient is a 39 y/o male with PMH significant for DM Type 1, gastroparesis, HTN, ESRD, chronic L foot diabetic wound infection. Patient admitted for sepsis 2/2 acute osteomyelitis involving L ankle and foot. 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    PT Comments    Patient agreeable to OOB mobility this session after encouragement and convincing to demonstrate t/f to [name]. Patient refused assistance/touching but close supervision provided. Min guard for balance/unsteadiness for stand pivot t/f due to telling patient "I want to keep you covered" and patient agreeable. Patient with tangential speech and continues to get fixated on topics and unable to redirect due to patient getting agitated when interrupted. Patient continues to present with deficits in strength, endurance, balance, and functional mobility. Continue to recommend HHPT following discharge due to patient refusing SNF recommendation when discussed. Patient would benefit from SNF if agreeable.    Follow Up Recommendations  Home health PT;Supervision/Assistance - 24 hour (refuses SNF recommendation)     Equipment Recommendations  Rolling [name] with 5" wheels;3in1 (PT);Wheelchair (measurements PT);Wheelchair cushion (measurements PT)    Recommendations for Other Services       Precautions / Restrictions Precautions Precautions: Fall Restrictions Weight Bearing Restrictions: Yes LLE Weight Bearing: Non weight bearing    Mobility  Bed Mobility Overal bed mobility: Modified Independent                Transfers Overall transfer level: Needs assistance Equipment used: Rolling Magdalynn Davilla (2 wheeled) Transfers: Sit to/from [facility] Sit to Stand: Supervision Stand pivot transfers: Min guard       General transfer comment: supervision  for sit to stand due to patient refusing to be touched. Min guard for stand pivot due to "I want to keep you covered" and patient agreeable to statement. Discussed with patient about need for close proximity to him for safety  Ambulation/Gait                 Stairs             Wheelchair Mobility    Modified Rankin (Stroke Patients Only)       Balance Overall balance assessment: Needs assistance Sitting-balance support: No upper extremity supported;Feet supported Sitting balance-[name]: Fair     Standing balance support: Bilateral upper extremity supported;During functional activity Standing balance-[name]: Poor                              Cognition Arousal/Alertness: Awake/alert Behavior During Therapy: Agitated;Anxious Overall Cognitive Status: No family/caregiver present to determine baseline cognitive functioning                                 General Comments: rude and agitated, tangential speech with fixation on topics. Unable to redirect due to patient becoming agitated when interrupted      Exercises      General Comments        Pertinent Vitals/Pain Pain Assessment: Faces Faces Pain Scale: No hurt    Home Living                      Prior Function            PT Goals (  current goals can now be found in the care plan section) Acute Rehab PT Goals Patient Stated Goal: to go home PT Goal Formulation: With patient Time For Goal Achievement: 09/27/20 Potential to Achieve Goals: Good Progress towards PT goals: Progressing toward goals    Frequency    Min 3X/week      PT Plan Current plan remains appropriate    Co-evaluation PT/OT/SLP Co-Evaluation/Treatment: Yes Reason for Co-Treatment: Necessary to address cognition/behavior during functional activity;For patient/therapist safety PT goals addressed during session: Mobility/safety with mobility;Balance        AM-PAC PT "6 Clicks"  Mobility   Outcome Measure  Help needed turning from your back to your side while in a flat bed without using bedrails?: None Help needed moving from lying on your back to sitting on the side of a flat bed without using bedrails?: None Help needed moving to and from a bed to a chair (including a wheelchair)?: A Little Help needed standing up from a chair using your arms (e.g., wheelchair or bedside chair)?: A Little Help needed to walk in hospital room?: A Little Help needed climbing 3-5 steps with a railing? : A Lot 6 Click Score: 19    End of Session   Activity Tolerance: Treatment limited secondary to agitation Patient left: in bed;with call bell/phone within reach Nurse Communication: Mobility status PT Visit Diagnosis: Unsteadiness on feet (R26.81);Other abnormalities of gait and mobility (R26.89);Muscle weakness (generalized) (M62.81);Difficulty in walking, not elsewhere classified (R26.2)     Time: 4356-8616 PT Time Calculation (min) (ACUTE ONLY): 38 min  Charges:  $Therapeutic Activity: 23-37 mins                     Perrin Maltese, PT, DPT Acute Rehabilitation Services Pager [phone] Office [phone]    [name] 09/18/2020, 12:16 PM

## 2020-09-19 LAB — RENAL FUNCTION PANEL
Albumin: 1.8 g/dL — ABNORMAL LOW (ref 3.5–5.0)
Anion gap: 21 — ABNORMAL HIGH (ref 5–15)
BUN: 63 mg/dL — ABNORMAL HIGH (ref 6–20)
CO2: 15 mmol/L — ABNORMAL LOW (ref 22–32)
Calcium: 8.9 mg/dL (ref 8.9–10.3)
Chloride: 93 mmol/L — ABNORMAL LOW (ref 98–111)
Creatinine, Ser: 9.1 mg/dL — ABNORMAL HIGH (ref 0.61–1.24)
GFR, Estimated: 7 mL/min — ABNORMAL LOW (ref >60)
Glucose, Bld: 291 mg/dL — ABNORMAL HIGH (ref 70–99)
Phosphorus: 6.5 mg/dL — ABNORMAL HIGH (ref 2.5–4.6)
Potassium: 5.2 mmol/L — ABNORMAL HIGH (ref 3.5–5.1)
Sodium: 129 mmol/L — ABNORMAL LOW (ref 135–145)

## 2020-09-19 LAB — ANAEROBIC CULTURE

## 2020-09-19 LAB — CBC
HCT: 23.3 % — ABNORMAL LOW (ref 39.0–52.0)
Hemoglobin: 7.3 g/dL — ABNORMAL LOW (ref 13.0–17.0)
MCH: 29.1 pg (ref 26.0–34.0)
MCHC: 31.3 g/dL (ref 30.0–36.0)
MCV: 92.8 fL (ref 80.0–100.0)
Platelets: 213 10*3/uL (ref 150–400)
RBC: 2.51 MIL/uL — ABNORMAL LOW (ref 4.22–5.81)
RDW: 15.9 % — ABNORMAL HIGH (ref 11.5–15.5)
WBC: 38.9 10*3/uL — ABNORMAL HIGH (ref 4.0–10.5)
nRBC: 0 % (ref 0.0–0.2)

## 2020-09-19 LAB — GLUCOSE, CAPILLARY: Glucose-Capillary: 230 mg/dL — ABNORMAL HIGH (ref 70–99)

## 2020-09-19 MED ORDER — SODIUM CHLORIDE 0.9 % IV SOLN: 100.0000 mL | INTRAVENOUS | Status: DC | PRN

## 2020-09-19 MED ORDER — VANCOMYCIN HCL IN DEXTROSE 750-5 MG/150ML-% IV SOLN
INTRAVENOUS | Status: AC
  Filled 2020-09-19: qty 150

## 2020-09-19 MED ORDER — VANCOMYCIN HCL IN DEXTROSE 750-5 MG/150ML-% IV SOLN
750.0000 mg | INTRAVENOUS | Status: AC
  Administered 2020-09-19: 750 mg via INTRAVENOUS

## 2020-09-19 MED ORDER — LIDOCAINE-PRILOCAINE 2.5-2.5 % EX CREA: 1.0000 "application " | TOPICAL_CREAM | CUTANEOUS | Status: DC | PRN

## 2020-09-19 MED ORDER — PENTAFLUOROPROP-TETRAFLUOROETH EX AERO: 1.0000 "application " | INHALATION_SPRAY | CUTANEOUS | Status: DC | PRN

## 2020-09-19 MED ORDER — LIDOCAINE HCL (PF) 1 % IJ SOLN: 5.0000 mL | INTRAMUSCULAR | Status: DC | PRN

## 2020-09-19 MED ORDER — HEPARIN SODIUM (PORCINE) 1000 UNIT/ML IJ SOLN
INTRAMUSCULAR | Status: AC
  Filled 2020-09-19: qty 4

## 2020-09-19 NOTE — Progress Notes (Signed)
Pt refused insulin this morning. He becomes very irritable when RN asks him to put hospital gown on when staff is entering the room so he is lying in bed naked. Pt to Dialysis.

## 2020-09-19 NOTE — Progress Notes (Signed)
Patient is noncompliant all night, refused almost all his night meds including heparin , labs, vital signs and blood sugar check and wound care, using cursing words on staff this morning as well. Will keep educating him and notifyattending

## 2020-09-19 NOTE — Progress Notes (Signed)
Subjective: 3 Days Post-Op Procedure(s) (LRB): AMPUTATION BELOW KNEE (Left) States his wrist feels pretty good overall.  Dressing changed earlier today.  Objective: Vital signs in last 24 hours: Temp:  [98 F (36.7 C)-99.1 F (37.3 C)] 98 F (36.7 C) (12/23 1420) Pulse Rate:  [54-99] 87 (12/23 1420) Resp:  [16-18] 17 (12/23 1420) BP: (69-152)/(37-95) 121/39 (12/23 1420) SpO2:  [91 %-98 %] 97 % (12/23 1420) Weight:  [66.4 kg-68.9 kg] 66.4 kg (12/23 1420)  Intake/Output from previous day: No intake/output data recorded. Intake/Output this shift: No intake/output data recorded.  Recent Labs    09/17/20 0428 09/19/20 1152  HGB 8.0* 7.3*   Recent Labs    09/17/20 0428 09/19/20 1152  WBC 31.4* 38.9*  RBC 2.80* 2.51*  HCT 25.6* 23.3*  PLT 231 213   Recent Labs    09/17/20 0428 09/19/20 1152  NA 133* 129*  K 4.0 5.2*  CL 96* 93*  CO2 23 15*  BUN 33* 63*  CREATININE 5.97* 9.10*  GLUCOSE 408* 291*  CALCIUM 8.8* 8.9   No results for input(s): LABPT, INR in the last 72 hours.  Intact sensation and capillary refill all digits.  Moving all fingers.  No erythema or streaking proximal to dressing.  Actively using hand.  Assessment/Plan: S/p I&D left wrist abscess.  Cultures Staph a and diphtheroids.  Continue antibiotics.  Added AFB and fungus to cultures.  Start hydrotherapy left wrist wound.   Leanora Cover 09/19/2020, 11:04 PM

## 2020-09-19 NOTE — Progress Notes (Signed)
Patient want to get up on the bed to have bowel movement but I told him it's not allow to get out of the bed while having dialysis and patient insisted and still try to get up and pt fell to the floor. Assessment done from head to toe and no visible injury and pt denies any pain. And I encouraged pt not to move to much on the bed while on HD but still insisted and still doing whatever he wants to do.

## 2020-09-19 NOTE — Progress Notes (Signed)
Pharmacy Antibiotic Note  [name] is a 39 y.o. male admitted on 09/10/2020 with L foot wound infection. X-ray findings compatible with osteomyelitis. Pharmacy has been consulted for Vancomycin and Unasyn dosing.  Pt is ESRD on HD MWF. WBC 38.9, afebrile. Pt is s/p left BKA on 12/20. Intraoperative report shows extensive gangrenous muscle and purulent collection of the left tibia. The patient has no history of pseudomonas or other resistant gram negatives. Vancomycin random level prior to HD on 12/20 was in range at 21 mcg/mL. The patient got off their usual HD schedule, receiving HD on 12/23 and is also planned to receive on 12/24.  Plan: - Continue vancomycin IV 750 mg with HD MWF  - Target vancomycin level 15-25 mcg/mL - Continue cefepime IV 1 g q 24h - Monitor HD schedule, cultures and sensitivities, clinical progression, and length of therapy  Height: 5\' 7"  (170.2 cm) Weight: 68.9 kg (151 lb 14.4 oz) IBW/kg (Calculated) : 66.1  Temp (24hrs), Avg:98.6 F (37 C), Min:98.2 F (36.8 C), Max:99.1 F (37.3 C)  Recent Labs  Lab 09/14/20 0204 09/14/20 2303 09/15/20 0424 09/16/20 0141 09/16/20 0216 09/16/20 0807 09/16/20 1431 09/16/20 2159 09/17/20 0428 09/19/20 1152  WBC 26.3*  --  24.2*  --  24.7*  --   --   --  31.4* 38.9*  CREATININE 5.04*   < > 6.66*  --   --  7.70* 4.60* 5.24* 5.97* 9.10*  VANCOTROUGH  --   --   --  21*  --   --   --   --   --   --    < > = values in this interval not displayed.    Estimated Creatinine Clearance: 10.1 mL/min (A) (by C-G formula based on SCr of 9.1 mg/dL (H)).    No Known Allergies  Antimicrobials this admission: Zosyn x1 12/15  Vancomycin 12/15 >>  Cefepime 12/15 >>   Microbiology results: 12/20 left leg wound: few bacteroides thetaiotaomicron 12/20 left leg would: few enterococcus faecalis and proteus mirabilis 12/20 MRSA PCR negative 12/19 moderate staphylococcus aureus 12/17 BCx NGTD 12/14 Bcx: NGTD   Shauna Hugh,  PharmD, [place]  PGY-1 Pharmacy Resident 09/19/2020 2:34 PM  Please check [email or web address] for unit-specific pharmacy phone numbers.

## 2020-09-19 NOTE — Progress Notes (Addendum)
   Patient is inaccessible and not cooperative.   I attempted to see him x 3 for dressing change He is talking on the phone and will not cooperate.  Roxy Horseman PA-C  I have seen and evaluated the patient. I agree with the PA note as documented above.  39 year old male status post left BKA for necrotic foot wound.  During amputation there was purulent drainage encountered with dead muscle.  Ultimately the BK was partially closed and wet-to-dry packing has been ordered twice daily.  Discussed with the hospitalist would recommend continuing wet-to-dry packing at discharge.  Cultures not growing anything.  He does not need antibiotics from my standpoint given abscess has been drained, left open, and we are doing dressing care.  Has been difficult to do his dressing changes as previously noted  Marty Heck, MD [facility]: [phone]

## 2020-09-19 NOTE — Progress Notes (Signed)
[facility] Progress Note   Subjective: On HD off schedule D/T refusal to come to treatment 09/18/20. Disruptive behavior, now banned from hospital cafeteria D/T to inappropriate outbursts. Sleeping at present.     Objective Vitals:   09/18/20 0836 09/18/20 1700 09/19/20 0700 09/19/20 1030  BP: 130/82 108/75 95/64 (!) 109/43  Pulse: 86 89 99 85  Resp: 16  18   Temp: 98 F (36.7 C) 98.2 F (36.8 C) 98.5 F (36.9 C)   TempSrc: Oral Oral Oral   SpO2: 92% 95% 91%   Weight:      Height:       Physical Exam General: Chronically Ill appearing male in NAD Heart: RRR Lungs: CTAB Abdomen: S, ND Extremities: L BKA ace wrap. No RLE edema Dialysis Access: RIJ TDC blood lines connected.    Additional Objective Labs: Basic Metabolic Panel: Recent Labs  Lab 09/15/20 0424 09/16/20 0807 09/16/20 1431 09/16/20 2159 09/17/20 0428  NA 128* 126* 134* 135 133*  K 3.6 4.1 3.3* 3.9 4.0  CL 92* 87* 100 96* 96*  CO2 22 20*  --  23 23  GLUCOSE 327* 441* 38* 298* 408*  BUN 46* 60* 27* 31* 33*  CREATININE 6.66* 7.70* 4.60* 5.24* 5.97*  CALCIUM 8.8* 9.0  --  8.8* 8.8*  PHOS 4.8* 5.0*  --   --  4.5   Liver Function Tests: Recent Labs  Lab 09/13/20 0123 09/14/20 0204 09/15/20 0424 09/16/20 0807 09/17/20 0428  AST 17 15 14*  --   --   ALT 13 13 14   --   --   ALKPHOS 164* 153* 150*  --   --   BILITOT 1.1 1.1 0.4  --   --   PROT 6.8 6.4* 6.0*  --   --   ALBUMIN 2.0* 1.7* 1.6* 1.8* 2.0*   No results for input(s): LIPASE, AMYLASE in the last 168 hours. CBC: Recent Labs  Lab 09/13/20 0123 09/14/20 0204 09/14/20 2303 09/15/20 0424 09/16/20 0216 09/16/20 1431 09/17/20 0428  WBC 26.7* 26.3*  --  24.2* 24.7*  --  31.4*  NEUTROABS 22.9* 20.0*  --  20.3*  --   --   --   HGB 9.7* 9.5*   < > 8.5* 9.3* 9.9* 8.0*  HCT 29.0* 29.6*   < > 27.7* 29.3* 29.0* 25.6*  MCV 88.1 89.4  --  92.0 88.8  --  91.4  PLT 201 191  --  213 205  --  231   < > = values in this interval not  displayed.   Blood Culture    Component Value Date/Time   SDES WOUND LEFT LEG 09/16/2020 1549   SPECREQUEST PT ON CEFEPIME LEFT LEG ANTERIOR COMPARTMENT 09/16/2020 1549   CULT  09/16/2020 1549    CULTURE REINCUBATED FOR BETTER GROWTH Performed at [facility], [place] [address] [place], [place] [address]    REPTSTATUS PENDING 09/16/2020 1549    Cardiac Enzymes: No results for input(s): CKTOTAL, CKMB, CKMBINDEX, TROPONINI in the last 168 hours. CBG: Recent Labs  Lab 09/16/20 0640 09/16/20 1631 09/16/20 1652 09/16/20 2037 09/19/20 0711  GLUCAP 375* 116* 117* 170* 230*   Iron Studies: No results for input(s): IRON, TIBC, TRANSFERRIN, FERRITIN in the last 72 hours. @lablastinr3 @ Studies/Results: No results found. Medications: . ampicillin-sulbactam (UNASYN) IV 3 g (09/18/20 2208)  . vancomycin Stopped (09/16/20 1048)  . vancomycin     . (feeding supplement) PROSource Plus  30 mL Oral TID BM  .  calcitRIOL  2.5 mcg Oral Q M,W,F-HD  . Chlorhexidine Gluconate Cloth  6 each Topical Q0600  . cinacalcet  60 mg Oral Q breakfast  . [START ON 09/20/2020] darbepoetin (ARANESP) injection - DIALYSIS  60 mcg Intravenous Q Fri-HD  . heparin  5,000 Units Subcutaneous Q8H  . heparin sodium (porcine)  1,000 Units Intravenous Q M,W,F-HD  . insulin aspart  0-5 Units Subcutaneous QHS  . insulin aspart  0-6 Units Subcutaneous TID WC  . insulin glargine  6 Units Subcutaneous QHS  . multivitamin  1 tablet Oral QHS  . sevelamer carbonate  4,000 mg Oral TID WC   ESRD -HD MWF.K 3.6.HD today on schedule.OPVVShad scheduled forR BC AVF12/28/2021.  Dialysis Orders: [place] on MWFon 4 hours 15 minutes. MEQ68 kgHD Bath2K, 2 calcium bath, Heparin5000 unitsIVinitial then 5000 units IV-midrun Calcitriol 3.0 MCG p.o. q. Dialysis no ESA  Assessment/Plan: 1.Left footwound withosteomyelitis-S/P L BKA 12/20 per Dr. Donnetta Hutching. Patient not dealing with this well. PT consulted. Continue  Vanc & Cefepime. 2. L wrist lesion - s/p I&D. No gross purulence. Removal of indurated SQ tissue. Culture sentfor micro-NGTD. Wound packed. WC consulted.  3. ESRD MWF-HD off schedule today D/T refusal to come to treatment yesterday. Short HD tomorrow to get back on schedule.  4. Hypertension/volume- UF as tolerated tomorrow. Lower EDW as tolerated.  5. Anemiaof ESRD-Hgb8.0Transfuse as needed. ESA due tomorrow.  6. Metabolic bone disease -Ca and phos at goal. Continue p.o. calcitriolon HD,Renvelaand sensipar 7. Nutrition -carb modified renal protein supplementAlbumin2.0added protein supplement, renal vits 8. Diabetes mellitus type1-per admit 9. Bipolar disorder-seen by Psych. Doesn't meet criteria for in-patient admission.  10. Disposition-probable SNF as family not available to care for him.    Xitlally Mooneyham H. Arliene Rosenow NP-C 09/19/2020, 10:45 AM  [employer] [phone]

## 2020-09-19 NOTE — Progress Notes (Signed)
PROGRESS NOTE  [name] TIR:[id number] DOB: 1981-01-12 DOA: 09/10/2020 PCP: Kerin Perna, NP   LOS: 8 days   Brief Narrative / Interim history: 39 year old male with ESRD, DM 2, HTN, left foot diabetic foot ulcer who presented with left hand pain as well as erythema, swelling and warmth of all drainage of the foot.  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  He was eventually taken to the OR on 12/20-status post left BKA.  Intraoperative report shows extensive gangrenous muscle and purulent collection of the left tibia.  Follow-up intraoperative cultures, for now continue vancomycin and Unasyn. -PT recommended SNF versus home health, however per notes family cannot take him  Active Problems Left wrist abscess -Dr. Fredna Dow with hand surgery consulted and followed patient.  He was taken to the OR on 12/19 status post incision and drainage. Cultures grew staph aureus, sensitivities pending, keep on vancomycin  ESRD -nephrology consulted, continue home medications, refused dialysis yesterday, will have it done today  DM2, poorly controlled, with severe hyperglycemia-patient refusing intermittently interventions, refusing diabetic diet.  Continue insulin as he allows  Anemia of chronic renal insufficiency-hemoglobin stable  Pseudohyponatremia -noted  Scheduled Meds: . (feeding supplement) PROSource Plus  30 mL Oral TID BM  . calcitRIOL  2.5 mcg Oral Q M,W,F-HD  . Chlorhexidine Gluconate Cloth  6 each Topical Q0600  . cinacalcet  60 mg Oral Q breakfast  . [START ON 09/20/2020] darbepoetin (ARANESP) injection - DIALYSIS  60 mcg Intravenous Q Fri-HD  . heparin  5,000 Units Subcutaneous Q8H  . heparin  sodium (porcine)  1,000 Units Intravenous Q M,W,F-HD  . insulin aspart  0-5 Units Subcutaneous QHS  . insulin aspart  0-6 Units Subcutaneous TID WC  . insulin glargine  6 Units Subcutaneous QHS  . multivitamin  1 tablet Oral QHS  . sevelamer carbonate  4,000 mg Oral TID WC   Continuous Infusions: . ampicillin-sulbactam (UNASYN) IV 3 g (09/18/20 2208)  . vancomycin Stopped (09/16/20 1048)  . vancomycin     PRN Meds:.albuterol, HYDROmorphone (DILAUDID) injection, oxyCODONE, sevelamer carbonate  Diet Orders (From admission, onward)    Start     Ordered   09/16/20 1826  Diet regular Room service appropriate? Yes; Fluid consistency: Thin  Diet effective now       Question Answer Comment  Room service appropriate? Yes   Fluid consistency: Thin      09/16/20 1825          DVT prophylaxis: SCD's Start: 09/15/20 0359 heparin injection 5,000 Units Start: 09/11/20 0600     Code Status: Full Code  Family Communication: no family at bedside   Status is: Inpatient  Remains inpatient appropriate because:Inpatient level of care appropriate due to severity of illness   Dispo: The patient is from: Home              Anticipated d/c is to: Home              Anticipated d/c date is: 2 days              Patient currently is not medically stable to d/c.  Consultants:  Orthopedic surgery Vascular surgery  Procedures:  None  Microbiology  Wrist cultures-staph aureus, diphtheroids  Antimicrobials: Vang,  Unasyn   Objective: Vitals:   09/18/20 0500 09/18/20 0836 09/18/20 1700 09/19/20 0700  BP: 108/77 130/82 108/75 95/64  Pulse: 97 86 89 99  Resp: 17 16  18   Temp: 98.3 F (36.8 C) 98 F (36.7 C) 98.2 F (36.8 C) 98.5 F (36.9 C)  TempSrc: Oral Oral Oral Oral  SpO2: 98% 92% 95% 91%  Weight:      Height:       No intake or output data in the 24 hours ending 09/19/20 1040 Filed Weights   09/13/20 1140 09/16/20 0715 09/16/20 1056  Weight: 68 kg 70 kg 67.9 kg     Examination:  Constitutional: No distress Eyes: No icterus ENMT: Mucous membranes are moist.  Neck: normal, supple Respiratory: Clear bilaterally without wheezing.  Cardiovascular: Regular rate and rhythm, no edema Abdomen: Bowel sounds positive Musculoskeletal: no clubbing / cyanosis.  Skin: No new rashes Neurologic: No focal deficits  Data Reviewed: I have independently reviewed following labs and imaging studies   CBC: Recent Labs  Lab 09/13/20 0123 09/14/20 0204 09/14/20 2303 09/15/20 0424 09/16/20 0216 09/16/20 1431 09/17/20 0428  WBC 26.7* 26.3*  --  24.2* 24.7*  --  31.4*  NEUTROABS 22.9* 20.0*  --  20.3*  --   --   --   HGB 9.7* 9.5* 8.5* 8.5* 9.3* 9.9* 8.0*  HCT 29.0* 29.6* 25.0* 27.7* 29.3* 29.0* 25.6*  MCV 88.1 89.4  --  92.0 88.8  --  91.4  PLT 201 191  --  213 205  --  629   Basic Metabolic Panel: Recent Labs  Lab 09/13/20 0123 09/14/20 0204 09/14/20 2303 09/15/20 0424 09/16/20 0807 09/16/20 1431 09/16/20 2159 09/17/20 0428  NA 127* 130*   < > 128* 126* 134* 135 133*  K 4.1 4.1   < > 3.6 4.1 3.3* 3.9 4.0  CL 87* 90*   < > 92* 87* 100 96* 96*  CO2 25 28  --  22 20*  --  23 23  GLUCOSE 177* 241*   < > 327* 441* 38* 298* 408*  BUN 45* 32*   < > 46* 60* 27* 31* 33*  CREATININE 6.99* 5.04*   < > 6.66* 7.70* 4.60* 5.24* 5.97*  CALCIUM 9.1 9.3  --  8.8* 9.0  --  8.8* 8.8*  MG 2.3 2.3  --  2.3  --   --   --   --   PHOS 5.4* 4.7*  --  4.8* 5.0*  --   --  4.5   < > = values in this interval not displayed.   Liver Function Tests: Recent Labs  Lab 09/13/20 0123 09/14/20 0204 09/15/20 0424 09/16/20 0807 09/17/20 0428  AST 17 15 14*  --   --   ALT 13 13 14   --   --   ALKPHOS 164* 153* 150*  --   --   BILITOT 1.1 1.1 0.4  --   --   PROT 6.8 6.4* 6.0*  --   --   ALBUMIN 2.0* 1.7* 1.6* 1.8* 2.0*   Coagulation Profile: No results for input(s): INR, PROTIME in the last 168 hours. HbA1C: No results for input(s): HGBA1C in the last 72  hours. CBG: Recent Labs  Lab 09/16/20 0640 09/16/20 1631 09/16/20 1652 09/16/20 2037 09/19/20 0711  GLUCAP 375* 116* 117* 170* 230*    Recent Results (from the past 240 hour(s))  Blood culture (routine x 2)     Status: None   Collection Time:  09/10/20  4:56 PM   Specimen: BLOOD  Result Value Ref Range Status   Specimen Description BLOOD SITE NOT SPECIFIED  Final   Special Requests   Final    BOTTLES DRAWN AEROBIC AND ANAEROBIC Blood Culture adequate volume   Culture   Final    NO GROWTH 5 DAYS Performed at [facility], [address] [place], [place] [address]    Report Status 09/15/2020 FINAL  Final  Resp Panel by RT-PCR (Flu A&B, Covid) Nasopharyngeal Swab     Status: None   Collection Time: 09/11/20  4:41 AM   Specimen: Nasopharyngeal Swab; Nasopharyngeal(NP) swabs in vial transport medium  Result Value Ref Range Status   SARS Coronavirus 2 by RT PCR NEGATIVE NEGATIVE Final    Comment: (NOTE) SARS-CoV-2 target nucleic acids are NOT DETECTED.  The SARS-CoV-2 RNA is generally detectable in upper respiratory specimens during the acute phase of infection. The lowest concentration of SARS-CoV-2 viral copies this assay can detect is 138 copies/mL. A negative result does not preclude SARS-Cov-2 infection and should not be used as the sole basis for treatment or other patient management decisions. A negative result may occur with  improper specimen collection/handling, submission of specimen other than nasopharyngeal swab, presence of viral mutation(s) within the areas targeted by this assay, and inadequate number of viral copies(<138 copies/mL). A negative result must be combined with clinical observations, patient history, and epidemiological information. The expected result is Negative.  Fact Sheet for Patients:  EntrepreneurPulse.com.au  Fact Sheet for Healthcare Providers:  IncredibleEmployment.be  This test is no t yet  approved or cleared by the [place] FDA and  has been authorized for detection and/or diagnosis of SARS-CoV-2 by FDA under an Emergency Use Authorization (EUA). This EUA will remain  in effect (meaning this test can be used) for the duration of the COVID-19 declaration under Section 564(b)(1) of the Act, 21 U.S.C.section 360bbb-3(b)(1), unless the authorization is terminated  or revoked sooner.       Influenza A by PCR NEGATIVE NEGATIVE Final   Influenza B by PCR NEGATIVE NEGATIVE Final    Comment: (NOTE) The Xpert Xpress SARS-CoV-2/FLU/RSV plus assay is intended as an aid in the diagnosis of influenza from Nasopharyngeal swab specimens and should not be used as a sole basis for treatment. Nasal washings and aspirates are unacceptable for Xpert Xpress SARS-CoV-2/FLU/RSV testing.  Fact Sheet for Patients: EntrepreneurPulse.com.au  Fact Sheet for Healthcare Providers: IncredibleEmployment.be  This test is not yet approved or cleared by the [place] FDA and has been authorized for detection and/or diagnosis of SARS-CoV-2 by FDA under an Emergency Use Authorization (EUA). This EUA will remain in effect (meaning this test can be used) for the duration of the COVID-19 declaration under Section 564(b)(1) of the Act, 21 U.S.C. section 360bbb-3(b)(1), unless the authorization is terminated or revoked.  Performed at [facility], [place] [address], [place], [place] [address]   Blood culture (routine x 2)     Status: None   Collection Time: 09/13/20  1:23 AM   Specimen: BLOOD  Result Value Ref Range Status   Specimen Description BLOOD RIGHT ANTECUBITAL  Final   Special Requests   Final    BOTTLES DRAWN AEROBIC AND ANAEROBIC Blood Culture adequate volume   Culture   Final    NO GROWTH 5 DAYS Performed at [facility], [place] [address], [place], [place] [address]    Report Status 09/18/2020 FINAL  Final  Aerobic/Anaerobic Culture  (  surgical/deep wound)     Status: None (Preliminary result)   Collection Time: 09/15/20  1:26 AM   Specimen: Wound; Abscess  Result Value Ref Range Status   Specimen Description WOUND LEFT WRIST  Final   Special Requests PT ON VANCOMYCIN  Final   Gram Stain NO WBC SEEN NO ORGANISMS SEEN   Final   Culture   Final    MODERATE STAPHYLOCOCCUS AUREUS ABUNDANT DIPHTHEROIDS(CORYNEBACTERIUM SPECIES) SUSCEPTIBILITIES TO FOLLOW Performed at [facility], [address] [place], [place] [address]    Report Status PENDING  Incomplete  MRSA PCR Screening     Status: None   Collection Time: 09/16/20  5:08 AM   Specimen: Nasal Mucosa; Nasopharyngeal  Result Value Ref Range Status   MRSA by PCR NEGATIVE NEGATIVE Final    Comment:        The GeneXpert MRSA Assay (FDA approved for NASAL specimens only), is one component of a comprehensive MRSA colonization surveillance program. It is not intended to diagnose MRSA infection nor to guide or monitor treatment for MRSA infections. Performed at [facility], [place] [address], [place], [place] [address]   Aerobic Culture (superficial specimen)     Status: None (Preliminary result)   Collection Time: 09/16/20  3:49 PM   Specimen: Leg, Left; Wound  Result Value Ref Range Status   Specimen Description WOUND LEFT LEG  Final   Special Requests PT ON CEFEPIME LEFT LEG ANTERIOR COMPARTMENT  Final   Gram Stain NO WBC SEEN NO ORGANISMS SEEN   Final   Culture   Final    CULTURE REINCUBATED FOR BETTER GROWTH Performed at [facility], [address] [place], [place] [address]    Report Status PENDING  Incomplete     Radiology Studies: No results found.  Marzetta Board, MD, PhD [facility]  Between 7 am - 7 pm I am available, please contact me via Amion or Securechat  Between 7 pm - 7 am I am not available, please contact night coverage MD/APP via Amion

## 2020-09-19 NOTE — Progress Notes (Signed)
Pt returned to 5N31 from HD. Pt had gotten BM on left arm bandage and was removing dressing when RN entered room. RN asked pt not to do that but refused and took all of dressing off. Guaze, kerlix, and ACE wrap reapplied. Bandage changed on left BKA per order. Pt refused for RN to put bed alarm on and that he wouldn't call when he need help to the bathroom because "I'm not going to have someone with me when I go home 24/7 so if I fall I fall, I'm a man and I gotta get through it."

## 2020-09-20 LAB — AEROBIC/ANAEROBIC CULTURE W GRAM STAIN (SURGICAL/DEEP WOUND): Gram Stain: NONE SEEN

## 2020-09-20 LAB — AEROBIC CULTURE W GRAM STAIN (SUPERFICIAL SPECIMEN): Gram Stain: NONE SEEN

## 2020-09-20 MED ORDER — HALOPERIDOL 5 MG PO TABS
5.0000 mg | ORAL_TABLET | Freq: Four times a day (QID) | ORAL | Status: DC | PRN
  Filled 2020-09-20: qty 1

## 2020-09-20 MED ORDER — CHLORHEXIDINE GLUCONATE CLOTH 2 % EX PADS: 6.0000 | MEDICATED_PAD | Freq: Every day | CUTANEOUS | Status: DC

## 2020-09-20 MED ORDER — HALOPERIDOL LACTATE 5 MG/ML IJ SOLN
5.0000 mg | Freq: Four times a day (QID) | INTRAMUSCULAR | Status: DC | PRN
  Administered 2020-09-20: 16:00:00 5 mg via INTRAMUSCULAR
  Filled 2020-09-20 (×2): qty 1

## 2020-09-20 NOTE — Progress Notes (Signed)
Pt informed RN that he is going to meet his 'friend' downstairs and will have some 'chill time' together. RN told pt that his off-unit privileges order has been discontinued by doctor and that he cannot leave the floor. Pt then said 'No f* doctor will stop me from going out!'. Pt keeps calling out the front desk and cussing and yelling out over the call bell. RN went to talk to the pt. Pt was requesting a [place] sandwich and asking RN to take him to the vending machine and that after he eat he will go down and meet his 'friend'. Again, RN explained to him that he cannot leave the floor. Pt got mad and started cursing out, yelling out every nursing staff that go inside his room. Security was called and attempted to talk to the pt however pt won't listen and would keep talking out loud.

## 2020-09-20 NOTE — Progress Notes (Signed)
Occupational Therapy Treatment Patient Details Name: [name] MRN: [id number] DOB: 08/24/1981 Today's Date: 09/20/2020    History of present illness Patient is a 39 y/o male with PMH significant for DM Type 1, gastroparesis, HTN, ESRD, chronic L foot diabetic wound infection. Patient admitted for sepsis 2/2 acute osteomyelitis involving L ankle and foot. 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   OT comments  Patient presents naked supine in bed. Required max assist for pericare and donning LB clothing supine in bed - though demonstrates physical abilities to assist more. Initially agreeable to OOB activity but had forgotten the agreement within several minutes and accusing therapists of "tricking him." Patient rambling and animated with speaking but conversation tangential. Patient exhibits poor memory - forgetting what he and therapists said or did and then altering reality and accusing therapists of lying to him. Patient supervision to transfer to side of bed refusing assistance. Min guard for sit to stand from bed and ambulated with RW to the door. Patient fell asleep in standing and needed verbal cue to awaken. Patient placed in straight back chair while bed linen being changed and fell asleep 2 times in seated position. Once awakened patient arguing with therapists that he was not asleep, he was not tired, why would he be tired. Patient required max encouragement from therapists and security guard to return back to bed. Sit to stand from straight back chair with min guard and RW - patient refusing physical assistance and returned to supine - going into bed forward and placing weight through his residual limb and then denying it. Patient demonstrates unsafe mobility, poor retention of precautions, and needing assistance with ADLs and supervision for safe mobility. Participation and activity tolerance limited by agitation and emotional disturbances.    Follow Up Recommendations   Supervision/Assistance - 24 hour;SNF    Equipment Recommendations  3 in 1 bedside commode;Other (comment);Tub/shower bench;Wheelchair (measurements OT);Wheelchair cushion (measurements OT)    Recommendations for Other Services      Precautions / Restrictions Precautions Precautions: Fall Precaution Comments: Fell morning of 12/24 Restrictions Weight Bearing Restrictions: Yes LLE Weight Bearing: Non weight bearing       Mobility Bed Mobility Overal bed mobility: Modified Independent             General bed mobility comments: Patient able to use rails as needed for bed mobility. Refuses physical assistance.  Transfers Overall transfer level: Needs assistance Equipment used: Rolling walker (2 wheeled) Transfers: Sit to/from Stand Sit to Stand: Min guard Stand pivot transfers: Min guard       General transfer comment: min guard for safety. patient not allowing physical assistance. sit to stand from bed, straight back chair and ambulating short distance with walker.    Balance Overall balance assessment: No apparent balance deficits (not formally assessed) Sitting-balance support: No upper extremity supported;Feet supported Sitting balance-[name]: Fair     Standing balance support: Bilateral upper extremity supported;During functional activity Standing balance-[name]: Poor Standing balance comment: reliant on B UE support for dynamic tasks due to NWB                           ADL either performed or assessed with clinical judgement   ADL                       Lower Body Dressing: Maximal assistance;Bed level Lower Body Dressing Details (indicate  cue type and reason): Patient max assist today for donning pants - patient able to roll and lift buttocks off bed. Patient exhibits ability to perform himself at bed level - but would not. Attempted to don sock but exhibited difficulty - needing assistance to pull it over heel and ankle.      Toileting- Clothing Manipulation and Hygiene: Moderate assistance;Bed level         General ADL Comments: Patient wanting gluteal cleft to be cleaned by staff with wipe - but able to clean genitals. Patient exhibits physical abilities to perform himself.     Vision Baseline Vision/History: No visual deficits Patient Visual Report: No change from baseline     Perception     Praxis      Cognition Arousal/Alertness: Awake/alert (Alternates between awake and animated and falling asleep in sitting and once in standing.) Behavior During Therapy: Agitated Overall Cognitive Status: No family/caregiver present to determine baseline cognitive functioning Area of Impairment: Attention;Memory;Safety/judgement;Awareness;Problem solving                   Current Attention Level: Sustained Memory: Decreased short-term memory;Decreased recall of precautions   Safety/Judgement: Decreased awareness of safety;Decreased awareness of deficits Awareness: Emergent Problem Solving: Requires verbal cues;Difficulty sequencing          Exercises     Shoulder Instructions       General Comments      Pertinent Vitals/ Pain       Pain Assessment: Faces Faces Pain Scale: No hurt  Home Living                                          Prior Functioning/Environment              Frequency  Min 2X/week        Progress Toward Goals  OT Goals(current goals can now be found in the care plan section)  Progress towards OT goals: Not progressing toward goals - comment (limited by agitation and behaviors)  Acute Rehab OT Goals Patient Stated Goal: to go home OT Goal Formulation: With patient Time For Goal Achievement: 10/02/20 Potential to Achieve Goals: Good  Plan Discharge plan remains appropriate    Co-evaluation    PT/OT/SLP Co-Evaluation/Treatment: Yes Reason for Co-Treatment: For patient/therapist safety;Necessary to address cognition/behavior during  functional activity PT goals addressed during session: Mobility/safety with mobility OT goals addressed during session: ADL's and self-care      AM-PAC OT "6 Clicks" Daily Activity     Outcome Measure   Help from another person eating meals?: None Help from another person taking care of personal grooming?: None Help from another person toileting, which includes using toliet, bedpan, or urinal?: A Lot Help from another person bathing (including washing, rinsing, drying)?: A Little Help from another person to put on and taking off regular upper body clothing?: A Little Help from another person to put on and taking off regular lower body clothing?: A Lot 6 Click Score: 18    End of Session Equipment Utilized During Treatment: Rolling walker  OT Visit Diagnosis: Unsteadiness on feet (R26.81);Other abnormalities of gait and mobility (R26.89)   Activity Tolerance Treatment limited secondary to agitation   Patient Left in bed;with call bell/phone within reach;Other (comment) Clinical biochemist)   Nurse Communication Mobility status;Other (comment) (behaviors, memory, falling asleep)        Time: 5852-7782 OT  Time Calculation (min): 29 min  Charges: OT General Charges $OT Visit: 1 Visit OT Treatments $Self Care/Home Management : 8-22 mins  Derl Barrow, OTR/L [place]  Office [phone] Pager: [place] 09/20/2020, 2:45 PM

## 2020-09-20 NOTE — Progress Notes (Addendum)
Pt is verbally aggressive; threatening staff, telling that he "is recording everything and everyone will get fired." RN is unable to calm the patient. Security at the bedside. Pain medication was offered through the night; pt was given 5mg  of Oxycodone at 0441.
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
## 2020-09-20 NOTE — Progress Notes (Signed)
PT Cancellation Note  Patient Details Name: [name] MRN: [id number] DOB: [birth year]   Cancelled Treatment:    Reason Eval/Treat Not Completed: Patient declined, no reason specified. Pt declining hydrotherapy. Will re-attempt at the next available date.    Thelma Comp 09/20/2020, 2:28 PM   Rolinda Roan, PT, DPT Acute Rehabilitation Services Pager: [phone] Office: [phone]

## 2020-09-20 NOTE — Progress Notes (Signed)
°  Progress Note    09/20/2020 12:08 PM 4 Days Post-Op  Subjective: Patient fell overnight  Vitals:   09/19/20 1400 09/19/20 1420  BP: (!) 100/55 (!) 121/39  Pulse:  87  Resp:  17  Temp:  98 F (36.7 C)  SpO2:  97%    Physical Exam: Exceedingly unpleasant and foul mouthed Right chest TDC in place Left below-knee amputation lateral site is open muscle appears healthy this was repacked with wet-to-dry at the bedside  CBC    Component Value Date/Time   WBC 38.9 (H) 09/19/2020 1152   RBC 2.51 (L) 09/19/2020 1152   HGB 7.3 (L) 09/19/2020 1152   HGB 9.7 (L) 08/22/2014 0447   HCT 23.3 (L) 09/19/2020 1152   HCT 30.0 (L) 08/22/2014 0447   PLT 213 09/19/2020 1152   PLT 158 08/22/2014 0447   MCV 92.8 09/19/2020 1152   MCV 97 08/22/2014 0447   MCH 29.1 09/19/2020 1152   MCHC 31.3 09/19/2020 1152   RDW 15.9 (H) 09/19/2020 1152   RDW 15.3 (H) 08/22/2014 0447   LYMPHSABS 1.0 09/15/2020 0424   LYMPHSABS 1.7 08/22/2014 0447   MONOABS 2.4 (H) 09/15/2020 0424   MONOABS 0.8 08/22/2014 0447   EOSABS 0.2 09/15/2020 0424   EOSABS 0.4 08/22/2014 0447   BASOSABS 0.2 (H) 09/15/2020 0424   BASOSABS 0.1 08/22/2014 0447    BMET    Component Value Date/Time   NA 129 (L) 09/19/2020 1152   NA 138 08/22/2014 0447   K 5.2 (H) 09/19/2020 1152   K 4.0 08/22/2014 0447   CL 93 (L) 09/19/2020 1152   CL 100 08/22/2014 0447   CO2 15 (L) 09/19/2020 1152   CO2 30 08/22/2014 0447   GLUCOSE 291 (H) 09/19/2020 1152   GLUCOSE 88 08/22/2014 0447   BUN 63 (H) 09/19/2020 1152   BUN 14 08/22/2014 0447   CREATININE 9.10 (H) 09/19/2020 1152   CREATININE 5.00 (H) 08/22/2014 0447   CALCIUM 8.9 09/19/2020 1152   CALCIUM 8.2 (L) 08/22/2014 0447   GFRNONAA 7 (L) 09/19/2020 1152   GFRNONAA 14 (L) 08/22/2014 0447   GFRNONAA 5 (L) 06/19/2014 0937   GFRAA 7 (L) 05/30/2020 1923   GFRAA 17 (L) 08/22/2014 0447   GFRAA 6 (L) 06/19/2014 0937    INR    Component Value Date/Time   INR 1.2 07/22/2020 0618      Intake/Output Summary (Last 24 hours) at 09/20/2020 1208 Last data filed at 09/20/2020 0500 Gross per 24 hour  Intake 240 ml  Output 2500 ml  Net -2260 ml     Assessment/plan:  39 y.o. male is status post left below-knee amputation the lateral aspect of which is open.  He is on the schedule for permanent dialysis access creation in his right upper extremity on December 28.  If he remains inpatient and amenable we can proceed with this plan otherwise he can be rescheduled as an outpatient.    Calynn Ferrero C. Donzetta Matters, MD [facility] Office: [phone] Pager: [phone]  09/20/2020 12:08 PM

## 2020-09-20 NOTE — Progress Notes (Signed)
Patient was offered his pain medication a couple of times this morning; patient continues to curse and yell at nurse. Refuses to provide his arm band. Patient is not letting RN to administer his pain medication.

## 2020-09-20 NOTE — Progress Notes (Signed)
Patient ID: [name], male   DOB: 1981/07/22, 39 y.o.   MRN: [id number] I attempted to see/examine the patient however he is very confused, verbally abusive and unable to offer any meaningful interaction at this point.  Psychiatry consultation pending.  Elmarie Shiley MD [facility]. Office # [phone] Pager # [phone] 3:06 PM

## 2020-09-20 NOTE — Progress Notes (Signed)
Physical Therapy Treatment Patient Details Name: [name] MRN: [id number] DOB: 1981/02/12 Today's Date: 09/20/2020    History of Present Illness Patient is a 39 y/o male with PMH significant for DM Type 1, gastroparesis, HTN, ESRD, chronic L foot diabetic wound infection. Patient admitted for sepsis 2/2 acute osteomyelitis involving L ankle and foot. 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    PT Comments    Patient initially agreeable to OOB mobility, however within 5 minutes, patient had forgotten agreement and stated "You're tricking me." With max encouragement, patient agreeable to ambulate to door. Patient agitated and falling asleep throughout session with no memory of falling asleep, accusations of "lying" were made continuously due to memory deficits. Patient ambulated 12' to door with RW and min guard only due to "I want to keep you covered" with patient agreeable, patient fell asleep standing in door frame with minA to maintain balance and snapping/calling name to wake patient. Patient requires max encouragement from PT/OT/security to return to bed after falling asleep sitting in chair x 2 with no memory of events. Patient eventually returned to bed, however WB through R residual limb on bed, educated patient not to bear weight through R LE, however patient had no memory of events and stated "you're telling a story". Patient continues to be limited by agitation, generalized weakness, decreased activity tolerance, and impaired balance. Updated d/c recommendation to SNF due to decreased caregiver support and inaccessible home and need for intensive rehab to maximize functional mobility.     Follow Up Recommendations  SNF;Supervision/Assistance - 24 hour     Equipment Recommendations  Rolling Dajuan Turnley with 5" wheels;3in1 (PT);Wheelchair (measurements PT);Wheelchair cushion (measurements PT)    Recommendations for Other Services       Precautions / Restrictions  Precautions Precautions: Fall Restrictions Weight Bearing Restrictions: Yes LLE Weight Bearing: Non weight bearing    Mobility  Bed Mobility Overal bed mobility: Modified Independent                Transfers Overall transfer level: Needs assistance Equipment used: Rolling Gavinn Collard (2 wheeled) Transfers: Sit to/from Stand Sit to Stand: Supervision         General transfer comment: supervision for safety and patient refusing to be touched.  Ambulation/Gait Ambulation/Gait assistance: Min guard Gait Distance (Feet): 12 Feet Assistive device: Rolling Pakou Rainbow (2 wheeled) Gait Pattern/deviations: Step-to pattern;Decreased stride length     General Gait Details: minguard only due to "I want to keep you covered". Patient fell asleep in standing x 2 with snapping and calling name to awaken with no memory of falling asleep. Requested him to return to bed and patient sits in chair with falling asleep x 2 and no memory of falling asleep. With max encouragement from PT/OT/security, patient returns to bed. Patient agitated during entire session due to decreased memory of events that have occurred.   Stairs             Wheelchair Mobility    Modified Rankin (Stroke Patients Only)       Balance Overall balance assessment: Needs assistance Sitting-balance support: No upper extremity supported;Feet supported Sitting balance-[name]: Fair     Standing balance support: Bilateral upper extremity supported;During functional activity Standing balance-[name]: Poor Standing balance comment: reliant on B UE support for dynamic tasks due to NWB  Cognition Arousal/Alertness: Lethargic Behavior During Therapy: Agitated Overall Cognitive Status: No family/caregiver present to determine baseline cognitive functioning Area of Impairment: Attention;Memory;Safety/judgement;Awareness;Problem solving                   Current  Attention Level: Sustained Memory: Decreased short-term memory   Safety/Judgement: Decreased awareness of safety;Decreased awareness of deficits Awareness: Emergent Problem Solving: Requires verbal cues;Difficulty sequencing        Exercises      General Comments        Pertinent Vitals/Pain Pain Assessment: Faces Faces Pain Scale: No hurt    Home Living                      Prior Function            PT Goals (current goals can now be found in the care plan section) Acute Rehab PT Goals Patient Stated Goal: to go home PT Goal Formulation: With patient Time For Goal Achievement: 09/27/20 Potential to Achieve Goals: Good Progress towards PT goals: Progressing toward goals    Frequency    Min 3X/week      PT Plan Current plan remains appropriate    Co-evaluation PT/OT/SLP Co-Evaluation/Treatment: Yes Reason for Co-Treatment: Necessary to address cognition/behavior during functional activity;For patient/therapist safety PT goals addressed during session: Mobility/safety with mobility        AM-PAC PT "6 Clicks" Mobility   Outcome Measure  Help needed turning from your back to your side while in a flat bed without using bedrails?: None Help needed moving from lying on your back to sitting on the side of a flat bed without using bedrails?: None Help needed moving to and from a bed to a chair (including a wheelchair)?: A Little Help needed standing up from a chair using your arms (e.g., wheelchair or bedside chair)?: A Little Help needed to walk in hospital room?: A Little Help needed climbing 3-5 steps with a railing? : A Lot 6 Click Score: 19    End of Session   Activity Tolerance: Treatment limited secondary to agitation Patient left: in bed;with call bell/phone within reach (security present) Nurse Communication: Mobility status PT Visit Diagnosis: Unsteadiness on feet (R26.81);Other abnormalities of gait and mobility (R26.89);Muscle weakness  (generalized) (M62.81);Difficulty in walking, not elsewhere classified (R26.2)     Time: 3149-7026 PT Time Calculation (min) (ACUTE ONLY): 29 min  Charges:  $Therapeutic Activity: 8-22 mins                     Perrin Maltese, PT, DPT Acute Rehabilitation Services Pager [phone] Office [phone]    [name] 09/20/2020, 2:07 PM

## 2020-09-20 NOTE — Progress Notes (Signed)
PROGRESS NOTE  [name] ZOX:[id number] DOB: [birth year] DOA: 09/10/2020 PCP: Kerin Perna, NP   LOS: 9 days   Brief Narrative / Interim history: 39 year old male with ESRD, DM 2, HTN, left foot diabetic foot ulcer who presented with left hand pain as well as erythema, swelling and warmth of all drainage of the foot.  Eventually underwent BKA per vascular surgery.  Subjective / 24h Interval events: Seen earlier this morning, bedside RN reports that he got up to the door to yell for pain medications and then fell to the ground.  He is verbally abusive towards the staff and towards myself using profanities  Assessment & Plan: Principal Problem Sepsis due to acute on chronic osteomyelitis of the left foot and ankle status post BKA-vascular surgery consulted and followed patient while hospitalized.  He was eventually taken to the OR on 12/20-status post left BKA.  Intraoperative report shows extensive gangrenous muscle and purulent collection of the left tibia.  Follow-up intraoperative cultures, for now continue vancomycin and Unasyn. -PT recommended SNF versus home health, however per notes family cannot take him -Difficult placement -Discussed with Dr. Donzetta Matters, he reevaluated the wound after the fall this morning, no concerns from his standpoint  Active Problems Left wrist abscess -Dr. Fredna Dow with hand surgery consulted and followed patient.  He was taken to the OR on 12/19 status post incision and drainage. Cultures grew staph aureus, sensitivities pending, keep on vancomycin, culture also showed few bacteroids.  Has persistent leukocytosis  ESRD -nephrology consulted, continue home medications, intermittently refusing dialysis  DM2, poorly controlled, with severe hyperglycemia-patient refusing intermittently interventions, refusing diabetic diet.  Continue insulin as he allows  Anemia of chronic renal insufficiency-hemoglobin stable  Pseudohyponatremia -noted  Probable bipolar  disorder -Patient verbally aggressive throughout his entire hospital stay, he was banned from the cafeteria due to yelling and shouting profanities, his behavior is dismissive, irritable, his speech is tangential making medical care very difficult since he would not directly answer any of my questions.  Psychiatry consulted, does not meet criteria for inpatient psych.  Scheduled Meds: . (feeding supplement) PROSource Plus  30 mL Oral TID BM  . calcitRIOL  2.5 mcg Oral Q M,W,F-HD  . Chlorhexidine Gluconate Cloth  6 each Topical Q0600  . cinacalcet  60 mg Oral Q breakfast  . darbepoetin (ARANESP) injection - DIALYSIS  60 mcg Intravenous Q Fri-HD  . heparin  5,000 Units Subcutaneous Q8H  . heparin sodium (porcine)  1,000 Units Intravenous Q M,W,F-HD  . insulin aspart  0-5 Units Subcutaneous QHS  . insulin aspart  0-6 Units Subcutaneous TID WC  . insulin glargine  6 Units Subcutaneous QHS  . multivitamin  1 tablet Oral QHS  . sevelamer carbonate  4,000 mg Oral TID WC   Continuous Infusions: . ampicillin-sulbactam (UNASYN) IV 3 g (09/19/20 2036)  . vancomycin Stopped (09/16/20 1048)   PRN Meds:.albuterol, HYDROmorphone (DILAUDID) injection, oxyCODONE, sevelamer carbonate  Diet Orders (From admission, onward)    Start     Ordered   09/16/20 1826  Diet regular Room service appropriate? Yes; Fluid consistency: Thin  Diet effective now       Question Answer Comment  Room service appropriate? Yes   Fluid consistency: Thin      09/16/20 1825          DVT prophylaxis: SCD's Start: 09/15/20 0359 heparin injection 5,000 Units Start: 09/11/20 0600     Code Status: Full Code  Family Communication: no family at bedside  Status is: Inpatient  Remains inpatient appropriate because:Inpatient level of care appropriate due to severity of illness   Dispo: The patient is from: Home              Anticipated d/c is to: TBD              Anticipated d/c date is: 2 days              Patient  currently is not medically stable to d/c.  Consultants:  Orthopedic surgery Vascular surgery  Procedures:  None  Microbiology  Wrist cultures-staph aureus, diphtheroids  Antimicrobials: Vang, Unasyn   Objective: Vitals:   09/19/20 1300 09/19/20 1330 09/19/20 1400 09/19/20 1420  BP: (!) 69/51 120/80 (!) 100/55 (!) 121/39  Pulse:    87  Resp:    17  Temp:    98 F (36.7 C)  TempSrc:    Oral  SpO2:    97%  Weight:    66.4 kg  Height:        Intake/Output Summary (Last 24 hours) at 09/20/2020 0932 Last data filed at 09/20/2020 0500 Gross per 24 hour  Intake 240 ml  Output 2500 ml  Net -2260 ml   Filed Weights   09/16/20 1056 09/19/20 1006 09/19/20 1420  Weight: 67.9 kg 68.9 kg 66.4 kg    Examination:  Constitutional: Agitated Eyes: No icterus ENMT: mmm.   Cursed me when trying to approach, deferred exam today  Data Reviewed: I have independently reviewed following labs and imaging studies   CBC: Recent Labs  Lab 09/14/20 0204 09/14/20 2303 09/15/20 0424 09/16/20 0216 09/16/20 1431 09/17/20 0428 09/19/20 1152  WBC 26.3*  --  24.2* 24.7*  --  31.4* 38.9*  NEUTROABS 20.0*  --  20.3*  --   --   --   --   HGB 9.5*   < > 8.5* 9.3* 9.9* 8.0* 7.3*  HCT 29.6*   < > 27.7* 29.3* 29.0* 25.6* 23.3*  MCV 89.4  --  92.0 88.8  --  91.4 92.8  PLT 191  --  213 205  --  231 213   < > = values in this interval not displayed.   Basic Metabolic Panel: Recent Labs  Lab 09/14/20 0204 09/14/20 2303 09/15/20 0424 09/16/20 0807 09/16/20 1431 09/16/20 2159 09/17/20 0428 09/19/20 1152  NA 130*   < > 128* 126* 134* 135 133* 129*  K 4.1   < > 3.6 4.1 3.3* 3.9 4.0 5.2*  CL 90*   < > 92* 87* 100 96* 96* 93*  CO2 28  --  22 20*  --  23 23 15*  GLUCOSE 241*   < > 327* 441* 38* 298* 408* 291*  BUN 32*   < > 46* 60* 27* 31* 33* 63*  CREATININE 5.04*   < > 6.66* 7.70* 4.60* 5.24* 5.97* 9.10*  CALCIUM 9.3  --  8.8* 9.0  --  8.8* 8.8* 8.9  MG 2.3  --  2.3  --   --   --    --   --   PHOS 4.7*  --  4.8* 5.0*  --   --  4.5 6.5*   < > = values in this interval not displayed.   Liver Function Tests: Recent Labs  Lab 09/14/20 0204 09/15/20 0424 09/16/20 0807 09/17/20 0428 09/19/20 1152  AST 15 14*  --   --   --   ALT 13 14  --   --   --  ALKPHOS 153* 150*  --   --   --   BILITOT 1.1 0.4  --   --   --   PROT 6.4* 6.0*  --   --   --   ALBUMIN 1.7* 1.6* 1.8* 2.0* 1.8*   Coagulation Profile: No results for input(s): INR, PROTIME in the last 168 hours. HbA1C: No results for input(s): HGBA1C in the last 72 hours. CBG: Recent Labs  Lab 09/16/20 0640 09/16/20 1631 09/16/20 1652 09/16/20 2037 09/19/20 0711  GLUCAP 375* 116* 117* 170* 230*    Recent Results (from the past 240 hour(s))  Blood culture (routine x 2)     Status: None   Collection Time: 09/10/20  4:56 PM   Specimen: BLOOD  Result Value Ref Range Status   Specimen Description BLOOD SITE NOT SPECIFIED  Final   Special Requests   Final    BOTTLES DRAWN AEROBIC AND ANAEROBIC Blood Culture adequate volume   Culture   Final    NO GROWTH 5 DAYS Performed at [facility], [address] [place], [place] [address]    Report Status 09/15/2020 FINAL  Final  Resp Panel by RT-PCR (Flu A&B, Covid) Nasopharyngeal Swab     Status: None   Collection Time: 09/11/20  4:41 AM   Specimen: Nasopharyngeal Swab; Nasopharyngeal(NP) swabs in vial transport medium  Result Value Ref Range Status   SARS Coronavirus 2 by RT PCR NEGATIVE NEGATIVE Final    Comment: (NOTE) SARS-CoV-2 target nucleic acids are NOT DETECTED.  The SARS-CoV-2 RNA is generally detectable in upper respiratory specimens during the acute phase of infection. The lowest concentration of SARS-CoV-2 viral copies this assay can detect is 138 copies/mL. A negative result does not preclude SARS-Cov-2 infection and should not be used as the sole basis for treatment or other patient management decisions. A negative result may occur with   improper specimen collection/handling, submission of specimen other than nasopharyngeal swab, presence of viral mutation(s) within the areas targeted by this assay, and inadequate number of viral copies(<138 copies/mL). A negative result must be combined with clinical observations, patient history, and epidemiological information. The expected result is Negative.  Fact Sheet for Patients:  EntrepreneurPulse.com.au  Fact Sheet for Healthcare Providers:  IncredibleEmployment.be  This test is no t yet approved or cleared by the [place] FDA and  has been authorized for detection and/or diagnosis of SARS-CoV-2 by FDA under an Emergency Use Authorization (EUA). This EUA will remain  in effect (meaning this test can be used) for the duration of the COVID-19 declaration under Section 564(b)(1) of the Act, 21 U.S.C.section 360bbb-3(b)(1), unless the authorization is terminated  or revoked sooner.       Influenza A by PCR NEGATIVE NEGATIVE Final   Influenza B by PCR NEGATIVE NEGATIVE Final    Comment: (NOTE) The Xpert Xpress SARS-CoV-2/FLU/RSV plus assay is intended as an aid in the diagnosis of influenza from Nasopharyngeal swab specimens and should not be used as a sole basis for treatment. Nasal washings and aspirates are unacceptable for Xpert Xpress SARS-CoV-2/FLU/RSV testing.  Fact Sheet for Patients: EntrepreneurPulse.com.au  Fact Sheet for Healthcare Providers: IncredibleEmployment.be  This test is not yet approved or cleared by the [place] FDA and has been authorized for detection and/or diagnosis of SARS-CoV-2 by FDA under an Emergency Use Authorization (EUA). This EUA will remain in effect (meaning this test can be used) for the duration of the COVID-19 declaration under Section 564(b)(1) of the Act, 21 U.S.C. section  360bbb-3(b)(1), unless the authorization is terminated  or revoked.  Performed at [facility], [place] [address], [place], [place] [address]   Blood culture (routine x 2)     Status: None   Collection Time: 09/13/20  1:23 AM   Specimen: BLOOD  Result Value Ref Range Status   Specimen Description BLOOD RIGHT ANTECUBITAL  Final   Special Requests   Final    BOTTLES DRAWN AEROBIC AND ANAEROBIC Blood Culture adequate volume   Culture   Final    NO GROWTH 5 DAYS Performed at [facility], [place] [address] [name], [place] [address]    Report Status 09/18/2020 FINAL  Final  Aerobic/Anaerobic Culture (surgical/deep wound)     Status: None (Preliminary result)   Collection Time: 09/15/20  1:26 AM   Specimen: Wound; Abscess  Result Value Ref Range Status   Specimen Description WOUND LEFT WRIST  Final   Special Requests PT ON VANCOMYCIN  Final   Gram Stain NO WBC SEEN NO ORGANISMS SEEN   Final   Culture   Final    MODERATE STAPHYLOCOCCUS AUREUS ABUNDANT DIPHTHEROIDS(CORYNEBACTERIUM SPECIES) Standardized susceptibility testing for this organism is not available. Performed at [facility], [place] [address] [place], [place] [address]    Report Status PENDING  Incomplete   Organism ID, Bacteria STAPHYLOCOCCUS AUREUS  Final      Susceptibility   Staphylococcus aureus - MIC*    CIPROFLOXACIN <=0.5 SENSITIVE Sensitive     ERYTHROMYCIN >=8 RESISTANT Resistant     GENTAMICIN <=0.5 SENSITIVE Sensitive     OXACILLIN <=0.25 SENSITIVE Sensitive     TETRACYCLINE >=16 RESISTANT Resistant     VANCOMYCIN <=0.5 SENSITIVE Sensitive     TRIMETH/SULFA <=10 SENSITIVE Sensitive     CLINDAMYCIN >=8 RESISTANT Resistant     RIFAMPIN <=0.5 SENSITIVE Sensitive     Inducible Clindamycin NEGATIVE Sensitive     * MODERATE STAPHYLOCOCCUS AUREUS  MRSA PCR Screening     Status: None   Collection Time: 09/16/20  5:08 AM   Specimen: Nasal Mucosa; Nasopharyngeal  Result Value Ref Range Status   MRSA by PCR NEGATIVE NEGATIVE Final    Comment:         The GeneXpert MRSA Assay (FDA approved for NASAL specimens only), is one component of a comprehensive MRSA colonization surveillance program. It is not intended to diagnose MRSA infection nor to guide or monitor treatment for MRSA infections. Performed at [facility], [place] [address] [place], [place] [address]   Anaerobic culture     Status: None   Collection Time: 09/16/20  3:49 PM   Specimen: Leg, Left; Wound  Result Value Ref Range Status   Specimen Description WOUND LEFT LEG  Final   Special Requests PT ON CEFEPIME LEFT LEG ANTERIOR COMPARTMENT  Final   Culture   Final    FEW BACTEROIDES THETAIOTAOMICRON BETA LACTAMASE POSITIVE Performed at [facility], [address] [place],  [address]    Report Status 09/19/2020 FINAL  Final  Aerobic Culture (superficial specimen)     Status: None (Preliminary result)   Collection Time: 09/16/20  3:49 PM   Specimen: Leg, Left; Wound  Result Value Ref Range Status   Specimen Description WOUND LEFT LEG  Final   Special Requests PT ON CEFEPIME LEFT LEG ANTERIOR COMPARTMENT  Final   Gram Stain NO WBC SEEN NO ORGANISMS SEEN   Final   Culture   Final    FEW  ENTEROCOCCUS FAECALIS FEW PROTEUS MIRABILIS SUSCEPTIBILITIES TO FOLLOW Performed at [facility], [place] [address], [place], [place] [address]    Report Status PENDING  Incomplete     Radiology Studies: No results found.  Marzetta Board, MD, PhD [facility]  Between 7 am - 7 pm I am available, please contact me via Amion or Securechat  Between 7 pm - 7 am I am not available, please contact night coverage MD/APP via Amion

## 2020-09-20 NOTE — Progress Notes (Signed)
Pt has been very manipulative with nursing staffs. Kept calling at the front desk and ask staffs to do something for him. When entering in pt's room, pt would only complain about everything and would start cursing out words to staff.

## 2020-09-20 NOTE — Progress Notes (Addendum)
When RN was coming onto shift pt was standing in front of room door yelling "I want my pain medicine." Pt ended up falling in that position and another RN went to assist pt. When this RN entered room to help get pt back to bed pt was crying and yelling "Fuck you guys you let me fall. Get out of my room, get the fuck away from me I don't want you to touch me. Your people are killing my people." RN left the room. MD was arriving to unit and informed pt had fallen to the floor. MD assisted other RN with helping pt get back to bed. AC and security on unit to speak with pt. He continues to yell out and be irritable. Pt refused for vital signs to be taken. Will attempt to change left BKA dressing if he allows.

## 2020-09-20 NOTE — Progress Notes (Addendum)
Pt's Sevelamer carbonate was given with his snacks. Due to pt's irritability nursing assessment is not complete. Pt is verbally abusive towards anyone who comes to his room.

## 2020-09-20 NOTE — Progress Notes (Signed)
RN received called from pt's aunt that pt called her and told her that he was not getting his pain medication. RN went to pt's room to give him pain medication and pt refused stating he wanted to wait until after he ate. RN attempted to help pt order food but then he got on his cell phone. RN ordered pt a house tray.

## 2020-09-20 NOTE — Progress Notes (Signed)
AC called for team support due to patient's physical and verbal behavior. When I entered the room the patient was yelling expletives and threats of physical violence to the nursing staff. Patient requested to leave the hospital grounds. AMA paperwork was offered, but the patient declined and continued to make threats. Medical and administrative team were unable to rationalize with the patient or determine any requests. Security and [employer] called due to fear of physical violence. MD called for medical support.

## 2020-09-21 DIAGNOSIS — F4320 Adjustment disorder, unspecified: Secondary | ICD-10-CM

## 2020-09-21 LAB — COMPREHENSIVE METABOLIC PANEL
ALT: 22 U/L (ref 0–44)
AST: 17 U/L (ref 15–41)
Albumin: 1.8 g/dL — ABNORMAL LOW (ref 3.5–5.0)
Alkaline Phosphatase: 187 U/L — ABNORMAL HIGH (ref 38–126)
Anion gap: 28 — ABNORMAL HIGH (ref 5–15)
BUN: 47 mg/dL — ABNORMAL HIGH (ref 6–20)
CO2: 11 mmol/L — ABNORMAL LOW (ref 22–32)
Calcium: 8 mg/dL — ABNORMAL LOW (ref 8.9–10.3)
Chloride: 90 mmol/L — ABNORMAL LOW (ref 98–111)
Creatinine, Ser: 7.34 mg/dL — ABNORMAL HIGH (ref 0.61–1.24)
GFR, Estimated: 9 mL/min — ABNORMAL LOW (ref >60)
Glucose, Bld: 370 mg/dL — ABNORMAL HIGH (ref 70–99)
Potassium: 4.4 mmol/L (ref 3.5–5.1)
Sodium: 129 mmol/L — ABNORMAL LOW (ref 135–145)
Total Bilirubin: 1.7 mg/dL — ABNORMAL HIGH (ref 0.3–1.2)
Total Protein: 6.5 g/dL (ref 6.5–8.1)

## 2020-09-21 LAB — GLUCOSE, CAPILLARY
Glucose-Capillary: 133 mg/dL — ABNORMAL HIGH (ref 70–99)
Glucose-Capillary: 208 mg/dL — ABNORMAL HIGH (ref 70–99)
Glucose-Capillary: 235 mg/dL — ABNORMAL HIGH (ref 70–99)
Glucose-Capillary: 257 mg/dL — ABNORMAL HIGH (ref 70–99)

## 2020-09-21 LAB — CBC
HCT: 25.9 % — ABNORMAL LOW (ref 39.0–52.0)
Hemoglobin: 7.4 g/dL — ABNORMAL LOW (ref 13.0–17.0)
MCH: 27.6 pg (ref 26.0–34.0)
MCHC: 28.6 g/dL — ABNORMAL LOW (ref 30.0–36.0)
MCV: 96.6 fL (ref 80.0–100.0)
Platelets: 245 10*3/uL (ref 150–400)
RBC: 2.68 MIL/uL — ABNORMAL LOW (ref 4.22–5.81)
RDW: 16.9 % — ABNORMAL HIGH (ref 11.5–15.5)
WBC: 36.8 10*3/uL — ABNORMAL HIGH (ref 4.0–10.5)
nRBC: 0.1 % (ref 0.0–0.2)

## 2020-09-21 MED ORDER — DARBEPOETIN ALFA 60 MCG/0.3ML IJ SOSY
60.0000 ug | PREFILLED_SYRINGE | Freq: Once | INTRAMUSCULAR | Status: AC
  Administered 2020-09-21: 17:00:00 60 ug via SUBCUTANEOUS
  Filled 2020-09-21 (×3): qty 0.3

## 2020-09-21 NOTE — Progress Notes (Signed)
PROGRESS NOTE  [name]:[id number] DOB: Sep 18, 1981 DOA: 09/10/2020 PCP: Kerin Perna, NP   LOS: 10 days   Brief Narrative / Interim history: 39 year old male with ESRD, DM 2, HTN, left foot diabetic foot ulcer who presented with left hand pain as well as erythema, swelling and warmth of all drainage of the foot.  Eventually underwent BKA per vascular surgery.  Subjective / 24h Interval events: Much Calmer this morning, very unusual.  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  He was eventually taken to the OR on 12/20-status post left BKA.  Intraoperative report shows extensive gangrenous muscle and purulent collection of the left tibia.  Follow-up intraoperative cultures, for now continue vancomycin and Unasyn. -PT recommended SNF versus home health, however per notes family cannot take him -Difficult placement -Case discussed with Dr. Posey Pronto from nephrology and Dr Donzetta Matters with vascular  Active Problems Left wrist abscess -Dr. Fredna Dow with hand surgery consulted and followed patient.  He was taken to the OR on 12/19 status post incision and drainage.  Currently on Unasyn  ESRD -nephrology consulted, continue home medications, intermittently refusing dialysis  DM2, poorly controlled, with severe hyperglycemia-patient refusing intermittently interventions, refusing diabetic diet.  Continue insulin regimen as he allows [place]  Anemia of chronic renal insufficiency-hemoglobin stable  Pseudohyponatremia -noted  Probable bipolar disorder -Much Calmer today, very verbally aggressive yesterday  Scheduled Meds: . (feeding supplement) PROSource Plus  30 mL Oral TID BM  . calcitRIOL  2.5 mcg Oral Q M,W,F-HD  . Chlorhexidine Gluconate Cloth  6 each Topical Q0600  . Chlorhexidine Gluconate Cloth  6 each Topical  Q0600  . cinacalcet  60 mg Oral Q breakfast  . darbepoetin (ARANESP) injection - DIALYSIS  60 mcg Intravenous Q Fri-HD  . darbepoetin (ARANESP) injection - NON-DIALYSIS  60 mcg Subcutaneous Once  . heparin  5,000 Units Subcutaneous Q8H  . heparin sodium (porcine)  1,000 Units Intravenous Q M,W,F-HD  . insulin aspart  0-5 Units Subcutaneous QHS  . insulin aspart  0-6 Units Subcutaneous TID WC  . insulin glargine  6 Units Subcutaneous QHS  . multivitamin  1 tablet Oral QHS  . sevelamer carbonate  4,000 mg Oral TID WC   Continuous Infusions: . ampicillin-sulbactam (UNASYN) IV 3 g (09/21/20 0940)   PRN Meds:.albuterol, haloperidol **OR** haloperidol lactate, HYDROmorphone (DILAUDID) injection, oxyCODONE, sevelamer carbonate  Diet Orders (From admission, onward)    Start     Ordered   09/16/20 1826  Diet regular Room service appropriate? Yes; Fluid consistency: Thin  Diet effective now       Question Answer Comment  Room service appropriate? Yes   Fluid consistency: Thin      09/16/20 1825          DVT prophylaxis: SCD's Start: 09/15/20 0359 heparin injection 5,000 Units Start: 09/11/20 0600     Code Status: Full Code  Family Communication: no family at bedside   Status is: Inpatient  Remains inpatient appropriate because:Inpatient level of care appropriate due to severity of illness   Dispo: The patient is from: Home              Anticipated d/c is to: TBD              Anticipated d/c date is: 2 days  Patient currently is not medically stable to d/c.  Consultants:  Orthopedic surgery Vascular surgery  Procedures:  None  Microbiology  Wrist cultures-staph aureus, diphtheroids  Antimicrobials: Vang, Unasyn   Objective: Vitals:   09/19/20 1400 09/19/20 1420 09/21/20 0007 09/21/20 0814  BP: (!) 100/55 (!) 121/39 (!) 110/50 104/63  Pulse:  87 77   Resp:  17    Temp:  98 F (36.7 C)    TempSrc:  Oral    SpO2:  97% 98%   Weight:  66.4 kg     Height:        Intake/Output Summary (Last 24 hours) at 09/21/2020 0941 Last data filed at 09/20/2020 2000 Gross per 24 hour  Intake 240 ml  Output --  Net 240 ml   Filed Weights   09/16/20 1056 09/19/20 1006 09/19/20 1420  Weight: 67.9 kg 68.9 kg 66.4 kg    Examination:  Constitutional: NAD, calm, comfortable Eyes: no icterus  ENMT: mmm Respiratory: clear to auscultation bilaterally, no wheezing, no crackles. Normal respiratory effort. No accessory muscle use.  Cardiovascular: Regular rate and rhythm, no murmurs / rubs / gallops. No extremity edema.  Abdomen: no tenderness, no masses palpated. Bowel sounds positive.  Musculoskeletal: no clubbing / cyanosis. Normal muscle tone.  Skin: no rashes Neurologic: non focal   Data Reviewed: I have independently reviewed following labs and imaging studies   CBC: Recent Labs  Lab 09/15/20 0424 09/16/20 0216 09/16/20 1431 09/17/20 0428 09/19/20 1152 09/21/20 0847  WBC 24.2* 24.7*  --  31.4* 38.9* 36.8*  NEUTROABS 20.3*  --   --   --   --   --   HGB 8.5* 9.3* 9.9* 8.0* 7.3* 7.4*  HCT 27.7* 29.3* 29.0* 25.6* 23.3* 25.9*  MCV 92.0 88.8  --  91.4 92.8 96.6  PLT 213 205  --  231 213 161   Basic Metabolic Panel: Recent Labs  Lab 09/15/20 0424 09/16/20 0807 09/16/20 1431 09/16/20 2159 09/17/20 0428 09/19/20 1152  NA 128* 126* 134* 135 133* 129*  K 3.6 4.1 3.3* 3.9 4.0 5.2*  CL 92* 87* 100 96* 96* 93*  CO2 22 20*  --  23 23 15*  GLUCOSE 327* 441* 38* 298* 408* 291*  BUN 46* 60* 27* 31* 33* 63*  CREATININE 6.66* 7.70* 4.60* 5.24* 5.97* 9.10*  CALCIUM 8.8* 9.0  --  8.8* 8.8* 8.9  MG 2.3  --   --   --   --   --   PHOS 4.8* 5.0*  --   --  4.5 6.5*   Liver Function Tests: Recent Labs  Lab 09/15/20 0424 09/16/20 0807 09/17/20 0428 09/19/20 1152  AST 14*  --   --   --   ALT 14  --   --   --   ALKPHOS 150*  --   --   --   BILITOT 0.4  --   --   --   PROT 6.0*  --   --   --   ALBUMIN 1.6* 1.8* 2.0* 1.8*    Coagulation Profile: No results for input(s): INR, PROTIME in the last 168 hours. HbA1C: No results for input(s): HGBA1C in the last 72 hours. CBG: Recent Labs  Lab 09/16/20 1631 09/16/20 1652 09/16/20 2037 09/19/20 0711 09/21/20 0010  GLUCAP 116* 117* 170* 230* 208*    Recent Results (from the past 240 hour(s))  Blood culture (routine x 2)     Status: None   Collection Time: 09/13/20  1:23  AM   Specimen: BLOOD  Result Value Ref Range Status   Specimen Description BLOOD RIGHT ANTECUBITAL  Final   Special Requests   Final    BOTTLES DRAWN AEROBIC AND ANAEROBIC Blood Culture adequate volume   Culture   Final    NO GROWTH 5 DAYS Performed at [facility], [address] [place], [place] [address]    Report Status 09/18/2020 FINAL  Final  Aerobic/Anaerobic Culture (surgical/deep wound)     Status: None   Collection Time: 09/15/20  1:26 AM   Specimen: Wound; Abscess  Result Value Ref Range Status   Specimen Description WOUND LEFT WRIST  Final   Special Requests PT ON VANCOMYCIN  Final   Gram Stain NO WBC SEEN NO ORGANISMS SEEN   Final   Culture   Final    MODERATE STAPHYLOCOCCUS AUREUS ABUNDANT DIPHTHEROIDS(CORYNEBACTERIUM SPECIES) Standardized susceptibility testing for this organism is not available. NO ANAEROBES ISOLATED Performed at [facility], [place] [address] [place], [place] [address]    Report Status 09/20/2020 FINAL  Final   Organism ID, Bacteria STAPHYLOCOCCUS AUREUS  Final      Susceptibility   Staphylococcus aureus - MIC*    CIPROFLOXACIN <=0.5 SENSITIVE Sensitive     ERYTHROMYCIN >=8 RESISTANT Resistant     GENTAMICIN <=0.5 SENSITIVE Sensitive     OXACILLIN <=0.25 SENSITIVE Sensitive     TETRACYCLINE >=16 RESISTANT Resistant     VANCOMYCIN <=0.5 SENSITIVE Sensitive     TRIMETH/SULFA <=10 SENSITIVE Sensitive     CLINDAMYCIN >=8 RESISTANT Resistant     RIFAMPIN <=0.5 SENSITIVE Sensitive     Inducible Clindamycin NEGATIVE Sensitive      * MODERATE STAPHYLOCOCCUS AUREUS  MRSA PCR Screening     Status: None   Collection Time: 09/16/20  5:08 AM   Specimen: Nasal Mucosa; Nasopharyngeal  Result Value Ref Range Status   MRSA by PCR NEGATIVE NEGATIVE Final    Comment:        The GeneXpert MRSA Assay (FDA approved for NASAL specimens only), is one component of a comprehensive MRSA colonization surveillance program. It is not intended to diagnose MRSA infection nor to guide or monitor treatment for MRSA infections. Performed at [facility], [place] [address], [place], [place] [address]   Anaerobic culture     Status: None   Collection Time: 09/16/20  3:49 PM   Specimen: Leg, Left; Wound  Result Value Ref Range Status   Specimen Description WOUND LEFT LEG  Final   Special Requests PT ON CEFEPIME LEFT LEG ANTERIOR COMPARTMENT  Final   Culture   Final    FEW BACTEROIDES THETAIOTAOMICRON BETA LACTAMASE POSITIVE Performed at [facility], [address], [place], [place] [address]    Report Status 09/19/2020 FINAL  Final  Aerobic Culture (superficial specimen)     Status: None   Collection Time: 09/16/20  3:49 PM   Specimen: Leg, Left; Wound  Result Value Ref Range Status   Specimen Description WOUND LEFT LEG  Final   Special Requests PT ON CEFEPIME LEFT LEG ANTERIOR COMPARTMENT  Final   Gram Stain   Final    NO WBC SEEN NO ORGANISMS SEEN Performed at [facility], [address] [place],  [address]    Culture FEW ENTEROCOCCUS FAECALIS FEW PROTEUS MIRABILIS   Final   Report Status 09/20/2020 FINAL  Final   Organism ID, Bacteria ENTEROCOCCUS FAECALIS  Final   Organism ID, Bacteria PROTEUS MIRABILIS  Final      Susceptibility   Enterococcus faecalis - MIC*    AMPICILLIN <=2 SENSITIVE Sensitive     VANCOMYCIN 1 SENSITIVE Sensitive     GENTAMICIN SYNERGY SENSITIVE Sensitive     * FEW ENTEROCOCCUS FAECALIS   Proteus mirabilis - MIC*    AMPICILLIN <=2 SENSITIVE Sensitive     CEFAZOLIN <=4  SENSITIVE Sensitive     CEFEPIME <=0.12 SENSITIVE Sensitive     CEFTAZIDIME <=1 SENSITIVE Sensitive     CEFTRIAXONE <=0.25 SENSITIVE Sensitive     CIPROFLOXACIN <=0.25 SENSITIVE Sensitive     GENTAMICIN <=1 SENSITIVE Sensitive     IMIPENEM 2 SENSITIVE Sensitive     TRIMETH/SULFA <=20 SENSITIVE Sensitive     AMPICILLIN/SULBACTAM <=2 SENSITIVE Sensitive     PIP/TAZO <=4 SENSITIVE Sensitive     * FEW PROTEUS MIRABILIS     Radiology Studies: No results found.  Marzetta Board, MD, PhD [facility]  Between 7 am - 7 pm I am available, please contact me via Amion or Securechat  Between 7 pm - 7 am I am not available, please contact night coverage MD/APP via Amion

## 2020-09-21 NOTE — Progress Notes (Signed)
Physical Therapy Wound Treatment Patient Details  Name: [name] MRN: [id number] Date of Birth: 09-01-1981  Today's Date: 09/21/2020 Time: 3614-4315 Time Calculation (min): 30 min  Subjective  Subjective: Reports abscess showed up about 2 weeks prior to surgical drainage performed on 09/15/20.  Reports pain 7/10, pt premedicated. Patient and Family Stated Goals: Heal the wound Date of Onset: 09/15/20 Prior Treatments: surgical I&D 12/19  Pain Score: Pain Score: 7   Wound Assessment  Wound / Incision (Open or Dehisced) 09/21/20 Incision - Open Arm Left;Lower;Medial;Anterior open incision boarded by sutures, with exposed tendon (Active)  Wound Image   09/21/20 1000  Dressing Type Compression wrap;Gauze (Comment);Moist to dry 09/21/20 1000  Dressing Changed Changed 09/21/20 1000  Dressing Status Intact;Old drainage 09/21/20 1000  Dressing Change Frequency Daily 09/21/20 1000  Site / Wound Assessment Yellow;Pale 09/21/20 1000  % Wound base Red or Granulating 10% 09/21/20 1000  % Wound base Yellow/Fibrinous Exudate 70% 09/21/20 1000  % Wound base Other/Granulation Tissue (Comment) 20% 09/21/20 1000  Peri-wound Assessment Erythema (blanchable) 09/21/20 1000  Wound Length (cm) 2.6 cm 09/21/20 1000  Wound Width (cm) 1.3 cm 09/21/20 1000  Wound Surface Area (cm^2) 3.38 cm^2 09/21/20 1000  Undermining (cm) 1.2 09/21/20 1000  Margins Unattached edges (unapproximated) 09/21/20 1000  Closure Sutures 09/21/20 1000  Drainage Amount Minimal 09/21/20 1000  Drainage Description Serous 09/21/20 1000  Non-staged Wound Description Full thickness 09/21/20 1000  Treatment Hydrotherapy (Pulse lavage);Packing (Saline gauze);Debridement (Selective) 09/21/20 1000     Incision (Closed) 09/15/20 Arm Left (Active)  Dressing Type Compression wrap 09/20/20 0945  Dressing Changed 09/20/20 0945  Dressing Change Frequency PRN 09/17/20 1552  Site / Wound Assessment Bleeding 09/20/20 0945  Margins  Unattached edges (unapproximated) 09/20/20 0945  Drainage Amount Scant 09/20/20 0945  Drainage Description Serosanguineous 09/20/20 0945  Treatment Off loading 09/19/20 0745     Incision (Closed) 09/16/20 Leg Left (Active)  Dressing Type Compression wrap 09/20/20 0945  Dressing Changed 09/20/20 0945  Dressing Change Frequency Daily 09/20/20 0945  Site / Wound Assessment Dressing in place / Unable to assess 09/20/20 0945  Drainage Amount None 09/20/20 0945  Drainage Description Serosanguineous 09/19/20 0745  Treatment Off loading 09/20/20 0945       Wound Assessment and Plan  Wound Therapy - Assess/Plan/Recommendations Wound Therapy - Clinical Statement: Patient presents wtih sugical wound L wrist with pain and increased necrotic tissue which will benefit from hydrotherapy to cleanse and promote healing.  Patient with recent L BKA and multiple medical factors which may impair healing. Wound Therapy - Functional Problem List: Decreased mobility, decreased wound healing, peripheral arterial disease Factors Delaying/Impairing Wound Healing: Diabetes Mellitus;Vascular compromise;Multiple medical problems Hydrotherapy Plan: Debridement;Dressing change;Pulsatile lavage with suction;Patient/family education Wound Therapy - Frequency: 5X / week Wound Therapy - Current Recommendations: Other (comment) (none) Wound Therapy - Follow Up Recommendations: Skilled nursing facility  Wound Therapy Goals- Improve the function of patient's integumentary system by progressing the wound(s) through the phases of wound healing (inflammation - proliferation - remodeling) by: Decrease Necrotic Tissue to: 10 Decrease Necrotic Tissue - Progress: Goal set today Increase Granulation Tissue to: 70 Increase Granulation Tissue - Progress: Goal set today Additional Wound Therapy Goal: decrease undermining to <1 cm Additional Wound Therapy Goal - Progress: Goal set today Goals/treatment plan/discharge plan were made  with and agreed upon by patient/family: Yes Time For Goal Achievement: 7 days  Goals will be updated until maximal potential achieved or discharge criteria met.  Discharge criteria: when goals achieved,  discharge from hospital, MD decision/surgical intervention, no progress towards goals, refusal/missing three consecutive treatments without notification or medical reason.  GP     Reginia Naas  Magda Kiel, PT 09/21/2020, 10:55 AM

## 2020-09-21 NOTE — Plan of Care (Signed)
  Problem: Pain Managment: Goal: General experience of comfort will improve Outcome: Progressing   
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
## 2020-09-21 NOTE — Progress Notes (Signed)
Patient ID: [name], male   DOB: 18-Aug-1981, 39 y.o.   MRN: [id number]  [facility] Progress Note   Assessment/ Plan:   1. Left foot/ankle osteomyelitis with sepsis: Status post left below the knee amputation on 12/20. Ongoing pain management and physical therapy along with intravenous Unasyn. Because of behavioral limitations, limited vital signs from overnight but no evidence of fever. 2. Left wrist abscess: Status post incision and drainage on 12/19 by Dr. Fredna Dow. Cultures significant for Staphylococcus and diphtheroids-transitioned from cefepime/vancomycin now to Unasyn 3. ESRD: He is usually on a Monday/Wednesday/Friday dialysis schedule as an outpatient and unfortunately has been off schedule here in the hospital due to his refusal. The plan was to undertake dialysis yesterday to get him back onto schedule however, he declined this. Labs pending from today to decide on additional intervention-otherwise, we will schedule him for dialysis again tomorrow. 4. Anemia: With lower hemoglobin and hematocrit noted but without overt losses. This is in part from the inflammatory complex of recent surgery. ESA ordered. No indications for PRBC. 5. CKD-MBD: Elevated phosphorus level noted, continue renal diet and Renvela for phosphorus binding. On calcitriol and Sensipar for PTH control. 6. Bipolar disorder with possible underlying personality disorder: [name] was exceedingly verbally aggressive-apparently triggered by his dissatisfaction with pain control. Today appears to be flaccid and able to carry out a meaningful conversation. He was seen by psychiatry but did not meet criteria for admission to behavioral health. On as needed Haldol. 7. Hypertension: Blood pressure under satisfactory control at this time, he appears euvolemic on exam and is not on any antihypertensive medications. Monitor cautiously with dialysis tomorrow.  Subjective:   Reports that he is feeling a little better  today and realizes that he was "acting out yesterday". Inquires about going home.   Objective:   BP 104/63 (BP Location: Right Arm)   Pulse 77   Temp 98 F (36.7 C) (Oral)   Resp 17   Ht 5\' 7"  (1.702 m)   Wt 66.4 kg   SpO2 98%   BMI 22.93 kg/m   Physical Exam: Gen: Appears to be placid, comfortably resting in bed, sitter at bedside. Intermittently falling asleep during conversation. CVS: Pulse regular rhythm, normal rate, S1 and S2 normal Resp: Clear to auscultation bilaterally without distinct rales or rhonchi. Right IJ TDC Abd: Soft, flat, nontender, bowel sounds normal Ext: No right lower extremity edema, left BKA stump in clean dressing  Labs: BMET Recent Labs  Lab 09/14/20 2303 09/15/20 0424 09/16/20 0807 09/16/20 1431 09/16/20 2159 09/17/20 0428 09/19/20 1152  NA 133* 128* 126* 134* 135 133* 129*  K 2.7* 3.6 4.1 3.3* 3.9 4.0 5.2*  CL 102 92* 87* 100 96* 96* 93*  CO2  --  22 20*  --  23 23 15*  GLUCOSE 303* 327* 441* 38* 298* 408* 291*  BUN 30* 46* 60* 27* 31* 33* 63*  CREATININE 3.90* 6.66* 7.70* 4.60* 5.24* 5.97* 9.10*  CALCIUM  --  8.8* 9.0  --  8.8* 8.8* 8.9  PHOS  --  4.8* 5.0*  --   --  4.5 6.5*   CBC Recent Labs  Lab 09/15/20 0424 09/16/20 0216 09/16/20 1431 09/17/20 0428 09/19/20 1152  WBC 24.2* 24.7*  --  31.4* 38.9*  NEUTROABS 20.3*  --   --   --   --   HGB 8.5* 9.3* 9.9* 8.0* 7.3*  HCT 27.7* 29.3* 29.0* 25.6* 23.3*  MCV 92.0 88.8  --  91.4 92.8  PLT 213 205  --  231 213      Medications:    . (feeding supplement) PROSource Plus  30 mL Oral TID BM  . calcitRIOL  2.5 mcg Oral Q M,W,F-HD  . Chlorhexidine Gluconate Cloth  6 each Topical Q0600  . Chlorhexidine Gluconate Cloth  6 each Topical Q0600  . cinacalcet  60 mg Oral Q breakfast  . darbepoetin (ARANESP) injection - DIALYSIS  60 mcg Intravenous Q Fri-HD  . darbepoetin (ARANESP) injection - NON-DIALYSIS  60 mcg Subcutaneous Once  . heparin  5,000 Units Subcutaneous Q8H  . heparin  sodium (porcine)  1,000 Units Intravenous Q M,W,F-HD  . insulin aspart  0-5 Units Subcutaneous QHS  . insulin aspart  0-6 Units Subcutaneous TID WC  . insulin glargine  6 Units Subcutaneous QHS  . multivitamin  1 tablet Oral QHS  . sevelamer carbonate  4,000 mg Oral TID WC     Elmarie Shiley, MD 09/21/2020, 9:27 AM

## 2020-09-22 LAB — GLUCOSE, CAPILLARY
Glucose-Capillary: 175 mg/dL — ABNORMAL HIGH (ref 70–99)
Glucose-Capillary: 219 mg/dL — ABNORMAL HIGH (ref 70–99)
Glucose-Capillary: 247 mg/dL — ABNORMAL HIGH (ref 70–99)
Glucose-Capillary: 119 mg/dL — ABNORMAL HIGH (ref 70–99)

## 2020-09-22 LAB — COMPREHENSIVE METABOLIC PANEL
ALT: 19 U/L (ref 0–44)
AST: 12 U/L — ABNORMAL LOW (ref 15–41)
Albumin: 1.8 g/dL — ABNORMAL LOW (ref 3.5–5.0)
Alkaline Phosphatase: 194 U/L — ABNORMAL HIGH (ref 38–126)
Anion gap: 18 — ABNORMAL HIGH (ref 5–15)
BUN: 59 mg/dL — ABNORMAL HIGH (ref 6–20)
CO2: 23 mmol/L (ref 22–32)
Calcium: 7.8 mg/dL — ABNORMAL LOW (ref 8.9–10.3)
Chloride: 88 mmol/L — ABNORMAL LOW (ref 98–111)
Creatinine, Ser: 8.56 mg/dL — ABNORMAL HIGH (ref 0.61–1.24)
GFR, Estimated: 7 mL/min — ABNORMAL LOW (ref >60)
Glucose, Bld: 288 mg/dL — ABNORMAL HIGH (ref 70–99)
Potassium: 4 mmol/L (ref 3.5–5.1)
Sodium: 129 mmol/L — ABNORMAL LOW (ref 135–145)
Total Bilirubin: 0.6 mg/dL (ref 0.3–1.2)
Total Protein: 6.6 g/dL (ref 6.5–8.1)

## 2020-09-22 LAB — CBC
HCT: 23.1 % — ABNORMAL LOW (ref 39.0–52.0)
Hemoglobin: 6.9 g/dL — CL (ref 13.0–17.0)
MCH: 27.8 pg (ref 26.0–34.0)
MCHC: 29.9 g/dL — ABNORMAL LOW (ref 30.0–36.0)
MCV: 93.1 fL (ref 80.0–100.0)
Platelets: 225 10*3/uL (ref 150–400)
RBC: 2.48 MIL/uL — ABNORMAL LOW (ref 4.22–5.81)
RDW: 16.5 % — ABNORMAL HIGH (ref 11.5–15.5)
WBC: 20.6 10*3/uL — ABNORMAL HIGH (ref 4.0–10.5)
nRBC: 0 % (ref 0.0–0.2)

## 2020-09-22 LAB — PHOSPHORUS: Phosphorus: 6.3 mg/dL — ABNORMAL HIGH (ref 2.5–4.6)

## 2020-09-22 MED ORDER — LIDOCAINE-PRILOCAINE 2.5-2.5 % EX CREA: 1.0000 "application " | TOPICAL_CREAM | CUTANEOUS | Status: DC | PRN

## 2020-09-22 MED ORDER — HEPARIN SODIUM (PORCINE) 1000 UNIT/ML DIALYSIS: 40.0000 [IU]/kg | INTRAMUSCULAR | Status: DC | PRN

## 2020-09-22 MED ORDER — PENTAFLUOROPROP-TETRAFLUOROETH EX AERO: 1.0000 "application " | INHALATION_SPRAY | CUTANEOUS | Status: DC | PRN

## 2020-09-22 MED ORDER — CEFAZOLIN SODIUM-DEXTROSE 2-4 GM/100ML-% IV SOLN
2.0000 g | Freq: Once | INTRAVENOUS | Status: AC
  Administered 2020-09-22: 23:00:00 2 g via INTRAVENOUS
  Filled 2020-09-22: qty 100

## 2020-09-22 MED ORDER — CEFAZOLIN SODIUM-DEXTROSE 2-4 GM/100ML-% IV SOLN
2.0000 g | INTRAVENOUS | Status: DC
  Administered 2020-09-24: 2 g via INTRAVENOUS
  Filled 2020-09-22 (×3): qty 100

## 2020-09-22 MED ORDER — HEPARIN SODIUM (PORCINE) 1000 UNIT/ML DIALYSIS: 1000.0000 [IU] | INTRAMUSCULAR | Status: DC | PRN

## 2020-09-22 MED ORDER — SODIUM CHLORIDE 0.9 % IV SOLN: 100.0000 mL | INTRAVENOUS | Status: DC | PRN

## 2020-09-22 MED ORDER — ALTEPLASE 2 MG IJ SOLR: 2.0000 mg | Freq: Once | INTRAMUSCULAR | Status: DC | PRN

## 2020-09-22 MED ORDER — LIDOCAINE HCL (PF) 1 % IJ SOLN: 5.0000 mL | INTRAMUSCULAR | Status: DC | PRN

## 2020-09-22 NOTE — Plan of Care (Signed)
RN came to room to introduce self during shift change, Pt is apologetic of his behavior for the past couple of days. States he wants to put it behind him, start all over again and be compliant from now on. Pt is cooperative at this time and took his scheduled meds. RN informed him of his scheduled dialysis today and he acknowledged.  Problem: Education: Goal: Knowledge of General Education information will improve Description: Including pain rating scale, medication(s)/side effects and non-pharmacologic comfort measures Outcome: Progressing   Problem: Health Behavior/Discharge Planning: Goal: Ability to manage health-related needs will improve Outcome: Progressing   Problem: Clinical Measurements: Goal: Ability to maintain clinical measurements within normal limits will improve Outcome: Progressing   Problem: Activity: Goal: Risk for activity intolerance will decrease Outcome: Progressing   Problem: Nutrition: Goal: Adequate nutrition will be maintained Outcome: Progressing   Problem: Coping: Goal: Level of anxiety will decrease Outcome: Progressing   Problem: Elimination: Goal: Will not experience complications related to bowel motility Outcome: Progressing   Problem: Pain Managment: Goal: General experience of comfort will improve Outcome: Progressing   Problem: Safety: Goal: Ability to remain free from injury will improve Outcome: Progressing   Problem: Skin Integrity: Goal: Risk for impaired skin integrity will decrease Outcome: Progressing

## 2020-09-22 NOTE — Progress Notes (Signed)
   Left BKA wound wet-to-dry dressing change today at bedside.  Patient is on the schedule for Tuesday right brachiocephalic AV fistula versus graft.  Given his concern for infectious issues this will likely need to be postponed and will be fine for the patient to go home with wet-to-dry dressings of the left below-knee amputation site and we will reschedule his access surgery.  Ileah Falkenstein C. Donzetta Matters, MD [facility] Office: [phone] Pager: [phone]

## 2020-09-22 NOTE — Progress Notes (Signed)
   09/22/20 1000  Clinical Encounter Type  Visited With Patient;Health care provider  Visit Type Initial  Referral From Nurse  Consult/Referral To Chaplain  Spiritual Encounters  Spiritual Needs Emotional  Stress Factors  Patient Stress Factors Major life changes  Family Stress Factors None identified  Chaplain responded to consult for spiritual care. Patient was terrified when chaplain went to the side of the bed. He wanted to know if someone in his family had died. Chaplain responded that she was here to see him. He said he did not want to see a padre and to pray for him somewhere else   Chaplain responded that she would. Chaplain is available when needed. Sitter was bedside.

## 2020-09-22 NOTE — Progress Notes (Addendum)
Pharmacy Antibiotic Note  [name] is a 39 y.o. male with ESRD on HD-MWF admitted on 09/10/2020 with L foot wound infection. X-ray findings compatible with osteomyelitis. Pharmacy has been consulted for Unasyn dosing.  Patient is s/p left BKA on 12/20. Intraoperative report shows extensive gangrenous muscle and purulent collection of the left tibia. The patient has no history of pseudomonas or other resistant gram negatives. Patient also has left wrist wound growing MSSA.  Patient scheduled for another HD session today for holiday weekend and back on schedule (MWF) starting Monday 12/27. WBC remains elevated at 36.8, Tmax of 98.5. Will continue Unasyn for L wrist infection with HD dosing.   Plan: - Continue Unasyn 3 g IV every 12 hours - Monitor HD schedule, cultures and sensitivities, clinical progression, and length of therapy   Height: 5\' 7"  (170.2 cm) Weight: 66.4 kg (146 lb 6.2 oz) IBW/kg (Calculated) : 66.1  Temp (24hrs), Avg:98.5 F (36.9 C), Min:98.1 F (36.7 C), Max:98.7 F (37.1 C)  Recent Labs  Lab 09/16/20 0141 09/16/20 0216 09/16/20 0807 09/16/20 1431 09/16/20 2159 09/17/20 0428 09/19/20 1152 09/21/20 0847  WBC  --  24.7*  --   --   --  31.4* 38.9* 36.8*  CREATININE  --   --    < > 4.60* 5.24* 5.97* 9.10* 7.34*  VANCOTROUGH 21*  --   --   --   --   --   --   --    < > = values in this interval not displayed.    Estimated Creatinine Clearance: 12.4 mL/min (A) (by C-G formula based on SCr of 7.34 mg/dL (H)).    No Known Allergies  Antimicrobials this admission: 12/15 Vanc >> 12/24 12/15 Zosyn x1  12/15 Cefepime >> 12/21 12/22 Unasyn >>   Microbiology results: 12/25 BCx x2: NG <12 hours 12/20 left leg wound: few bacteroides thetaiotaomicron 12/20 left leg would: few enterococcus faecalis and proteus mirabilis; S Unasyn 12/20 MRSA PCR: negative 12/19 L wrist: MSSA; S Unasyn 12/17 BCx: neg, F 12/14 BCx: neg, F      Dreon Pineda L. Devin Going, PharmD,  [place] PGY2 Pharmacy Resident Weekends 7:00 am - 3:00 pm, please call [phone] 09/22/20      8:39 AM  Please check AMION for all [place] phone numbers After 10:00 PM, call the [place] [phone]      _____________________________ 09/22/2020 1030 Addendum  Changing Unasyn to Cefazolin for better MSSA coverage for L wrist infection. Due to patient's adherence issues, will give Cefazolin with HD-MWF.  Giving additional Cefazolin 2 g IV today for off-schedule HD today then start Cefazolin MWF with HD 12/27.  Plan: - Stop Unasyn - Give Cefazolin 2 g IV once today with additional HD session - Start Cefazolin 2 IV MWF with HD on 09/23/20 - Monitor HD schedule, cultures and sensitivities, clinical progression, and length of therapy    Brittan Butterbaugh L. Devin Going, PharmD, [place] PGY2 Pharmacy Resident Weekends 7:00 am - 3:00 pm, please call [phone] 09/22/20     10:34 AM  Please check AMION for all [place] phone numbers After 10:00 PM, call the [place] [phone]

## 2020-09-22 NOTE — Plan of Care (Addendum)
Patient very calm throughout the shift. Pt apologetic for being out of line previous shifts. Pt still complains of 9 out of 10 pain even when pt seems to be resting comfortably.    Problem: Education: Goal: Knowledge of General Education information will improve Description: Including pain rating scale, medication(s)/side effects and non-pharmacologic comfort measures Outcome: Progressing   Problem: Activity: Goal: Risk for activity intolerance will decrease Outcome: Progressing   Problem: Pain Managment: Goal: General experience of comfort will improve Outcome: Progressing   Problem: Safety: Goal: Ability to remain free from injury will improve Outcome: Progressing   Problem: Skin Integrity: Goal: Risk for impaired skin integrity will decrease Outcome: Progressing

## 2020-09-22 NOTE — Social Work (Addendum)
IVC rescinded per MD's request. Change of commitment has been faxed to magistrate office. CSW confirmed it was received.  Copy placed on pts chart.   Emeterio Reeve, Latanya Presser, Day Social Worker [phone]

## 2020-09-22 NOTE — Progress Notes (Signed)
PROGRESS NOTE  [name] OHY:[id number] DOB: 08/12/1981 DOA: 09/10/2020 PCP: Kerin Perna, NP   LOS: 11 days   Brief Narrative / Interim history: 39 year old male with ESRD, DM 2, HTN, left foot diabetic foot ulcer who presented with left hand pain as well as erythema, swelling and warmth of all drainage of the foot.  Eventually underwent BKA per vascular surgery and I&D wrist abscess  Subjective / 24h Interval events: Much Calmer this morning, very unusual.  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  He was eventually taken to the OR on 12/20-status post left BKA.  Intraoperative report shows extensive gangrenous muscle and purulent collection of the left tibia. Intraoperative cultures showing Enterococcus, Proteus, pan sensitive. Patient is clinically stable however still has significant leukocytosis but improving. Given concern for compliance with his home medications I discussed with pharmacy and will discharge on IV Cefazolin with HD moving forwards.   Active Problems Left wrist abscess -Dr. Fredna Dow with hand surgery consulted and followed patient.  He was taken to the OR on 12/19 status post incision and drainage. Cultures show MSSA. He will be placed on Ancef with HD, probably needs for 1 more week (3 HD sessions as an outpatient). Will need repeat CBC in a week, orthopedic and vascular surgery follow up. ESRD -nephrology consulted, continue home medications. He was initially on schedule to have AVG on Tuesday 12/28 however patient wants this delayed. Discussed with Dr Donzetta Matters and he is in agreement, will reschedule as an outpatient in couple of weeks. Also best to wait until his WBC is improving.  DM2, poorly controlled, with severe hyperglycemia-patient refusing intermittently interventions, refusing  diabetic diet. Continue insulin regimen  Anemia of chronic renal insufficiency-hemoglobin stable Pseudohyponatremia -noted Probable bipolar disorder -patient had an episode of aggression, psychosis, threatening behavior towards self and staff, GDP had to be called in on 12/24. He was IVCd. Psychiatry consulted. Next day patient very apologetic, realizes he was "acting out". Demeanor is very appropriate this morning, will dc IVC and doesn't need a sitter.   Disposition: discussed with patient that he is still having some medical issues. He is scheduled for HD this evening and his WBC is still elevated. He is asking to go home soon. Will recheck WBC Monday morning, if continues to improve should be OK for dc as long as he has IV Abx with HD and a CBC and wound rechecked within a week. Will arrange [place] on dc, d/w TOC  Scheduled Meds: . (feeding supplement) PROSource Plus  30 mL Oral TID BM  . calcitRIOL  2.5 mcg Oral Q M,W,F-HD  . Chlorhexidine Gluconate Cloth  6 each Topical Q0600  . Chlorhexidine Gluconate Cloth  6 each Topical Q0600  . cinacalcet  60 mg Oral Q breakfast  . darbepoetin (ARANESP) injection - DIALYSIS  60 mcg Intravenous Q Fri-HD  . heparin  5,000 Units Subcutaneous Q8H  . heparin sodium (porcine)  1,000 Units Intravenous Q M,W,F-HD  . insulin aspart  0-5 Units Subcutaneous QHS  . insulin aspart  0-6 Units Subcutaneous TID WC  . insulin glargine  6 Units Subcutaneous QHS  . multivitamin  1 tablet Oral QHS  . sevelamer carbonate  4,000 mg Oral TID WC   Continuous Infusions: . ampicillin-sulbactam (UNASYN) IV Stopped (09/21/20 2106)   PRN Meds:.albuterol, haloperidol **OR** haloperidol lactate, HYDROmorphone (  DILAUDID) injection, oxyCODONE, sevelamer carbonate  Diet Orders (From admission, onward)    Start     Ordered   09/16/20 1826  Diet regular Room service appropriate? Yes; Fluid consistency: Thin  Diet effective now       Question Answer Comment  Room service  appropriate? Yes   Fluid consistency: Thin      09/16/20 1825          DVT prophylaxis: SCD's Start: 09/15/20 0359 heparin injection 5,000 Units Start: 09/11/20 0600     Code Status: Full Code  Family Communication: no family at bedside   Status is: Inpatient  Remains inpatient appropriate because:Inpatient level of care appropriate due to severity of illness   Dispo: The patient is from: Home              Anticipated d/c is to: TBD              Anticipated d/c date is: 1 day              Patient currently is not medically stable to d/c.  Consultants:  Orthopedic surgery Vascular surgery  Procedures:  None  Microbiology  Wrist cultures-staph aureus, diphtheroids  Antimicrobials: Vang, Unasyn   Objective: Vitals:   09/21/20 1551 09/21/20 2108 09/22/20 0430 09/22/20 0758  BP: 115/76 115/66 111/66 112/80  Pulse: 85 86 80 83  Resp: 18 16 16 20   Temp: 98.7 F (37.1 C) 98.5 F (36.9 C) 98.6 F (37 C) 98.1 F (36.7 C)  TempSrc:   Oral   SpO2: 98% 90% 94% 99%  Weight:      Height:        Intake/Output Summary (Last 24 hours) at 09/22/2020 1015 Last data filed at 09/22/2020 0900 Gross per 24 hour  Intake 1220 ml  Output 1 ml  Net 1219 ml   Filed Weights   09/16/20 1056 09/19/20 1006 09/19/20 1420  Weight: 67.9 kg 68.9 kg 66.4 kg    Examination:  Constitutional: NAD. comfortable Eyes: no icterus  ENMT: mmm Respiratory: CTA biL, no wheezing Cardiovascular: RRR, no mrg Abdomen: soft, nt, nd, bs+ Musculoskeletal: no clubbing / cyanosis. Normal muscle tone.  Skin: no new rashes. 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 Neurologic: non focal   Data Reviewed: I have independently reviewed following labs and imaging studies   CBC: Recent Labs  Lab 09/16/20 0216 09/16/20 1431 09/17/20 0428 09/19/20 1152 09/21/20 0847  WBC 24.7*  --  31.4* 38.9* 36.8*  HGB 9.3* 9.9* 8.0* 7.3* 7.4*  HCT 29.3* 29.0* 25.6* 23.3* 25.9*  MCV 88.8  --  91.4  92.8 96.6  PLT 205  --  231 213 025   Basic Metabolic Panel: Recent Labs  Lab 09/16/20 0807 09/16/20 1431 09/16/20 2159 09/17/20 0428 09/19/20 1152 09/21/20 0847  NA 126* 134* 135 133* 129* 129*  K 4.1 3.3* 3.9 4.0 5.2* 4.4  CL 87* 100 96* 96* 93* 90*  CO2 20*  --  23 23 15* 11*  GLUCOSE 441* 38* 298* 408* 291* 370*  BUN 60* 27* 31* 33* 63* 47*  CREATININE 7.70* 4.60* 5.24* 5.97* 9.10* 7.34*  CALCIUM 9.0  --  8.8* 8.8* 8.9 8.0*  PHOS 5.0*  --   --  4.5 6.5*  --    Liver Function Tests: Recent Labs  Lab 09/16/20 0807 09/17/20 0428 09/19/20 1152 09/21/20 0847  AST  --   --   --  17  ALT  --   --   --  22  ALKPHOS  --   --   --  187*  BILITOT  --   --   --  1.7*  PROT  --   --   --  6.5  ALBUMIN 1.8* 2.0* 1.8* 1.8*   Coagulation Profile: No results for input(s): INR, PROTIME in the last 168 hours. HbA1C: No results for input(s): HGBA1C in the last 72 hours. CBG: Recent Labs  Lab 09/21/20 0010 09/21/20 1242 09/21/20 1635 09/21/20 2106 09/22/20 0701  GLUCAP 208* 257* 133* 235* 175*    Recent Results (from the past 240 hour(s))  Blood culture (routine x 2)     Status: None   Collection Time: 09/13/20  1:23 AM   Specimen: BLOOD  Result Value Ref Range Status   Specimen Description BLOOD RIGHT ANTECUBITAL  Final   Special Requests   Final    BOTTLES DRAWN AEROBIC AND ANAEROBIC Blood Culture adequate volume   Culture   Final    NO GROWTH 5 DAYS Performed at [facility], [place] [address], [place], [place] [address]    Report Status 09/18/2020 FINAL  Final  Aerobic/Anaerobic Culture (surgical/deep wound)     Status: None   Collection Time: 09/15/20  1:26 AM   Specimen: Wound; Abscess  Result Value Ref Range Status   Specimen Description WOUND LEFT WRIST  Final   Special Requests PT ON VANCOMYCIN  Final   Gram Stain NO WBC SEEN NO ORGANISMS SEEN   Final   Culture   Final    MODERATE STAPHYLOCOCCUS AUREUS ABUNDANT DIPHTHEROIDS(CORYNEBACTERIUM  SPECIES) Standardized susceptibility testing for this organism is not available. NO ANAEROBES ISOLATED Performed at [facility], [place] [address], [place], [place] [address]    Report Status 09/20/2020 FINAL  Final   Organism ID, Bacteria STAPHYLOCOCCUS AUREUS  Final      Susceptibility   Staphylococcus aureus - MIC*    CIPROFLOXACIN <=0.5 SENSITIVE Sensitive     ERYTHROMYCIN >=8 RESISTANT Resistant     GENTAMICIN <=0.5 SENSITIVE Sensitive     OXACILLIN <=0.25 SENSITIVE Sensitive     TETRACYCLINE >=16 RESISTANT Resistant     VANCOMYCIN <=0.5 SENSITIVE Sensitive     TRIMETH/SULFA <=10 SENSITIVE Sensitive     CLINDAMYCIN >=8 RESISTANT Resistant     RIFAMPIN <=0.5 SENSITIVE Sensitive     Inducible Clindamycin NEGATIVE Sensitive     * MODERATE STAPHYLOCOCCUS AUREUS  MRSA PCR Screening     Status: None   Collection Time: 09/16/20  5:08 AM   Specimen: Nasal Mucosa; Nasopharyngeal  Result Value Ref Range Status   MRSA by PCR NEGATIVE NEGATIVE Final    Comment:        The GeneXpert MRSA Assay (FDA approved for NASAL specimens only), is one component of a comprehensive MRSA colonization surveillance program. It is not intended to diagnose MRSA infection nor to guide or monitor treatment for MRSA infections. Performed at [facility], [place] [address] [place], [place] [address]   Anaerobic culture     Status: None   Collection Time: 09/16/20  3:49 PM   Specimen: Leg, Left; Wound  Result Value Ref Range Status   Specimen Description WOUND LEFT LEG  Final   Special Requests PT ON CEFEPIME LEFT LEG ANTERIOR COMPARTMENT  Final   Culture   Final    FEW BACTEROIDES THETAIOTAOMICRON BETA LACTAMASE POSITIVE Performed at [facility], [address], [place],  [address]    Report Status 09/19/2020 FINAL  Final  Aerobic Culture (superficial specimen)     Status: None   Collection Time: 09/16/20  3:49 PM   Specimen: Leg, Left; Wound  Result Value Ref Range  Status   Specimen Description WOUND LEFT LEG  Final   Special Requests PT ON CEFEPIME LEFT LEG ANTERIOR COMPARTMENT  Final   Gram Stain   Final    NO WBC SEEN NO ORGANISMS SEEN Performed at [facility], [address], [place], [place] [address]    Culture FEW ENTEROCOCCUS FAECALIS FEW PROTEUS MIRABILIS   Final   Report Status 09/20/2020 FINAL  Final   Organism ID, Bacteria ENTEROCOCCUS FAECALIS  Final   Organism ID, Bacteria PROTEUS MIRABILIS  Final      Susceptibility   Enterococcus faecalis - MIC*    AMPICILLIN <=2 SENSITIVE Sensitive     VANCOMYCIN 1 SENSITIVE Sensitive     GENTAMICIN SYNERGY SENSITIVE Sensitive     * FEW ENTEROCOCCUS FAECALIS   Proteus mirabilis - MIC*    AMPICILLIN <=2 SENSITIVE Sensitive     CEFAZOLIN <=4 SENSITIVE Sensitive     CEFEPIME <=0.12 SENSITIVE Sensitive     CEFTAZIDIME <=1 SENSITIVE Sensitive     CEFTRIAXONE <=0.25 SENSITIVE Sensitive     CIPROFLOXACIN <=0.25 SENSITIVE Sensitive     GENTAMICIN <=1 SENSITIVE Sensitive     IMIPENEM 2 SENSITIVE Sensitive     TRIMETH/SULFA <=20 SENSITIVE Sensitive     AMPICILLIN/SULBACTAM <=2 SENSITIVE Sensitive     PIP/TAZO <=4 SENSITIVE Sensitive     * FEW PROTEUS MIRABILIS  Culture, blood (Routine X 2) w Reflex to ID Panel     Status: None (Preliminary result)   Collection Time: 09/21/20  8:51 AM   Specimen: BLOOD  Result Value Ref Range Status   Specimen Description BLOOD RIGHT ANTECUBITAL  Final   Special Requests   Final    BOTTLES DRAWN AEROBIC AND ANAEROBIC Blood Culture adequate volume   Culture   Final    NO GROWTH <12 HOURS Performed at [facility], [address] [place], [place] [address]    Report Status PENDING  Incomplete  Culture, blood (Routine X 2) w Reflex to ID Panel     Status: None (Preliminary result)   Collection Time: 09/21/20  8:51 AM   Specimen: BLOOD  Result Value Ref Range Status   Specimen Description BLOOD RIGHT ANTECUBITAL  Final   Special Requests   Final     BOTTLES DRAWN AEROBIC AND ANAEROBIC Blood Culture results may not be optimal due to an inadequate volume of blood received in culture bottles   Culture   Final    NO GROWTH <12 HOURS Performed at [facility], [place] [address], [place], [place] [address]    Report Status PENDING  Incomplete     Radiology Studies: No results found.  Marzetta Board, MD, PhD [facility]  Between 7 am - 7 pm I am available, please contact me via Amion or Securechat  Between 7 pm - 7 am I am not available, please contact night coverage MD/APP via Amion

## 2020-09-22 NOTE — Progress Notes (Signed)
Patient ID: [name], male   DOB: 05/31/1981, 39 y.o.   MRN: [id number]  [facility] Progress Note   Assessment/ Plan:   1. Left foot/ankle osteomyelitis with sepsis: Status post left below the knee amputation on 12/20. Ongoing pain management and antibiotic therapy with intravenous cefazolin.  He has leukocytosis and this appears to be trending down without any clear focus of infection.  Being set up for [facility] PT. 2. Left wrist abscess: Status post incision and drainage on 12/19 by Dr. Fredna Dow. Cultures significant for Staphylococcus and diphtheroids-he was previously on Unasyn and will now be transitioned to intravenous Ancef with hemodialysis. 3. ESRD: He is usually on a Monday/Wednesday/Friday dialysis schedule as an outpatient and unfortunately has been off schedule here in the hospital due to his refusal.  Brief hemodialysis today followed by return to his usual outpatient dialysis schedule tomorrow (if discharged early in the day, this can be done at his outpatient dialysis unit).  Discussed fluid restriction with regards to his hyponatremia. 4. Anemia: With lower hemoglobin and hematocrit noted but without overt losses. This is in part from the inflammatory complex of recent surgery. ESA ordered. No indications for PRBC. 5. CKD-MBD: Elevated phosphorus level noted, continue renal diet and Renvela for phosphorus binding. On calcitriol and Sensipar for PTH control. 6. 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 Today appears to be flaccid and able to carry out a meaningful conversation. He was seen by psychiatry but did not meet criteria for admission to behavioral health. On as needed Haldol. 7. Hypertension: Blood pressure under satisfactory control at this time, he appears euvolemic on exam and is not on any antihypertensive medications.  Limited UF with  dialysis.  Subjective:   Inquires about going home-reports that pain control is satisfactory with occasional breakthrough.   Objective:   BP 112/80 (BP Location: Right Arm)   Pulse 83   Temp 98.1 F (36.7 C)   Resp 20   Ht 5\' 7"  (1.702 m)   Wt 66.4 kg   SpO2 99%   BMI 22.93 kg/m   Physical Exam: Gen: Resting comfortably in bed, in pleasant disposition. CVS: Pulse regular rhythm, normal rate, S1 and S2 normal Resp: Clear to auscultation bilaterally without distinct rales or rhonchi. Right IJ TDC Abd: Soft, flat, nontender, bowel sounds normal Ext: No right lower extremity edema, left BKA stump in clean dressing  Labs: BMET Recent Labs  Lab 09/16/20 0807 09/16/20 1431 09/16/20 2159 09/17/20 0428 09/19/20 1152 09/21/20 0847  NA 126* 134* 135 133* 129* 129*  K 4.1 3.3* 3.9 4.0 5.2* 4.4  CL 87* 100 96* 96* 93* 90*  CO2 20*  --  23 23 15* 11*  GLUCOSE 441* 38* 298* 408* 291* 370*  BUN 60* 27* 31* 33* 63* 47*  CREATININE 7.70* 4.60* 5.24* 5.97* 9.10* 7.34*  CALCIUM 9.0  --  8.8* 8.8* 8.9 8.0*  PHOS 5.0*  --   --  4.5 6.5*  --    CBC Recent Labs  Lab 09/16/20 0216 09/16/20 1431 09/17/20 0428 09/19/20 1152 09/21/20 0847  WBC 24.7*  --  31.4* 38.9* 36.8*  HGB 9.3* 9.9* 8.0* 7.3* 7.4*  HCT 29.3* 29.0* 25.6* 23.3* 25.9*  MCV 88.8  --  91.4 92.8 96.6  PLT 205  --  231 213 245     Medications:    . (feeding supplement) PROSource Plus  30 mL Oral TID BM  .  calcitRIOL  2.5 mcg Oral Q M,W,F-HD  . Chlorhexidine Gluconate Cloth  6 each Topical Q0600  . Chlorhexidine Gluconate Cloth  6 each Topical Q0600  . cinacalcet  60 mg Oral Q breakfast  . darbepoetin (ARANESP) injection - DIALYSIS  60 mcg Intravenous Q Fri-HD  . heparin  5,000 Units Subcutaneous Q8H  . heparin sodium (porcine)  1,000 Units Intravenous Q M,W,F-HD  . insulin aspart  0-5 Units Subcutaneous QHS  . insulin aspart  0-6 Units Subcutaneous TID WC  . insulin glargine  6 Units Subcutaneous QHS  .  multivitamin  1 tablet Oral QHS  . sevelamer carbonate  4,000 mg Oral TID WC   Elmarie Shiley, MD 09/22/2020, 11:11 AM

## 2020-09-23 ENCOUNTER — Other Ambulatory Visit: Payer: Self-pay

## 2020-09-23 ENCOUNTER — Other Ambulatory Visit (HOSPITAL_COMMUNITY): Payer: Medicare HMO

## 2020-09-23 DIAGNOSIS — N186 End stage renal disease: Secondary | ICD-10-CM

## 2020-09-23 LAB — CBC
HCT: 23.7 % — ABNORMAL LOW (ref 39.0–52.0)
Hemoglobin: 7 g/dL — ABNORMAL LOW (ref 13.0–17.0)
MCH: 28.1 pg (ref 26.0–34.0)
MCHC: 29.5 g/dL — ABNORMAL LOW (ref 30.0–36.0)
MCV: 95.2 fL (ref 80.0–100.0)
Platelets: 217 10*3/uL (ref 150–400)
RBC: 2.49 MIL/uL — ABNORMAL LOW (ref 4.22–5.81)
RDW: 16.7 % — ABNORMAL HIGH (ref 11.5–15.5)
WBC: 20.7 10*3/uL — ABNORMAL HIGH (ref 4.0–10.5)
nRBC: 0 % (ref 0.0–0.2)

## 2020-09-23 LAB — BASIC METABOLIC PANEL
Anion gap: 16 — ABNORMAL HIGH (ref 5–15)
BUN: 27 mg/dL — ABNORMAL HIGH (ref 6–20)
CO2: 24 mmol/L (ref 22–32)
Calcium: 7.9 mg/dL — ABNORMAL LOW (ref 8.9–10.3)
Chloride: 93 mmol/L — ABNORMAL LOW (ref 98–111)
Creatinine, Ser: 5.29 mg/dL — ABNORMAL HIGH (ref 0.61–1.24)
GFR, Estimated: 13 mL/min — ABNORMAL LOW (ref >60)
Glucose, Bld: 239 mg/dL — ABNORMAL HIGH (ref 70–99)
Potassium: 3.8 mmol/L (ref 3.5–5.1)
Sodium: 133 mmol/L — ABNORMAL LOW (ref 135–145)

## 2020-09-23 LAB — GLUCOSE, CAPILLARY
Glucose-Capillary: 207 mg/dL — ABNORMAL HIGH (ref 70–99)
Glucose-Capillary: 347 mg/dL — ABNORMAL HIGH (ref 70–99)
Glucose-Capillary: 427 mg/dL — ABNORMAL HIGH (ref 70–99)
Glucose-Capillary: 208 mg/dL — ABNORMAL HIGH (ref 70–99)

## 2020-09-23 MED ORDER — INSULIN GLARGINE 100 UNIT/ML ~~LOC~~ SOLN
10.0000 [IU] | Freq: Every day | SUBCUTANEOUS | Status: DC
  Administered 2020-09-23: 23:00:00 10 [IU] via SUBCUTANEOUS
  Filled 2020-09-23 (×2): qty 0.1

## 2020-09-23 NOTE — Progress Notes (Signed)
Physical Therapy Treatment Patient Details Name: [name] MRN: [id number] DOB: 1980-10-15 Today's Date: 09/23/2020    History of Present Illness Patient is a 39 y/o male with PMH significant for DM Type 1, gastroparesis, HTN, ESRD, chronic L foot diabetic wound infection. Patient admitted for sepsis 2/2 acute osteomyelitis involving L ankle and foot. 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    PT Comments    Patient very pleasant this session. Patient motivated to participate with therapy and "get over this". Instructed patient on exercises seated EOB and bed level to focus on strengthening L LE and improving L knee extension ROM. Patient ambulated 2 x 15' with RW and supervision (refuses to be touched due to wanting to be independent), no apparent LOB noted. Educated patient on positioning of L residual limb to promote knee extension while laying in bed. Patient continues to present with impaired balance, generalized weakness, decreased activity tolerance, and impaired functional mobility. Continue to recommend SNF for ongoing Physical Therapy.   If refuses SNF, recommend HHPT following discharge and will initiate stair training.     Follow Up Recommendations  SNF;Supervision/Assistance - 24 hour     Equipment Recommendations  Rolling Gearldean Lomanto with 5" wheels;3in1 (PT);Wheelchair (measurements PT);Wheelchair cushion (measurements PT)    Recommendations for Other Services       Precautions / Restrictions Precautions Precautions: Fall Precaution Comments: Fell evening on 12/23 and morning of 12/24 Restrictions Weight Bearing Restrictions: Yes LLE Weight Bearing: Non weight bearing    Mobility  Bed Mobility Overal bed mobility: Modified Independent                Transfers Overall transfer level: Needs assistance Equipment used: Rolling Sophira Rumler (2 wheeled) Transfers: Sit to/from Stand Sit to Stand: Supervision         General transfer comment:  supervision for safety, patient refuses to be touched  Ambulation/Gait Ambulation/Gait assistance: Supervision Gait Distance (Feet): 30 Feet (2 x 15') Assistive device: Rolling Milaina Sher (2 wheeled) Gait Pattern/deviations: Step-to pattern;Decreased stride length     General Gait Details: supervision for safety, refuses to be touched. No apparent LOB, seated rest break between trials   Stairs             Wheelchair Mobility    Modified Rankin (Stroke Patients Only)       Balance Overall balance assessment: No apparent balance deficits (not formally assessed)                                          Cognition Arousal/Alertness: Awake/alert Behavior During Therapy: WFL for tasks assessed/performed Overall Cognitive Status: Impaired/Different from baseline Area of Impairment: Attention;Memory;Safety/judgement;Awareness                   Current Attention Level: Sustained Memory: Decreased short-term memory;Decreased recall of precautions   Safety/Judgement: Decreased awareness of safety;Decreased awareness of deficits Awareness: Emergent Problem Solving: Requires verbal cues;Difficulty sequencing        Exercises Amputee Exercises Quad Sets: Left;10 reps Hip Flexion/Marching: Left;10 reps;Seated Knee Extension: Left;10 reps;Seated Straight Leg Raises: Left;10 reps    General Comments General comments (skin integrity, edema, etc.): patient very pleasant this session. Motivated to get better and has progressed to being Shane to look at residual limb. Still hesitant on touching residual limb.      Pertinent Vitals/Pain Pain Assessment: Faces Faces  Pain Scale: No hurt    Home Living                      Prior Function            PT Goals (current goals can now be found in the care plan section) Acute Rehab PT Goals Patient Stated Goal: to go home PT Goal Formulation: With patient Time For Goal Achievement:  09/27/20 Potential to Achieve Goals: Good Progress towards PT goals: Progressing toward goals    Frequency    Min 3X/week      PT Plan Current plan remains appropriate    Co-evaluation              AM-PAC PT "6 Clicks" Mobility   Outcome Measure  Help needed turning from your back to your side while in a flat bed without using bedrails?: None Help needed moving from lying on your back to sitting on the side of a flat bed without using bedrails?: None Help needed moving to and from a bed to a chair (including a wheelchair)?: A Little Help needed standing up from a chair using your arms (e.g., wheelchair or bedside chair)?: A Little Help needed to walk in hospital room?: A Little Help needed climbing 3-5 steps with a railing? : A Lot 6 Click Score: 19    End of Session   Activity Tolerance: Patient tolerated treatment well Patient left: in bed;with call bell/phone within reach;with bed alarm set Nurse Communication: Mobility status PT Visit Diagnosis: Unsteadiness on feet (R26.81);Other abnormalities of gait and mobility (R26.89);Muscle weakness (generalized) (M62.81);Difficulty in walking, not elsewhere classified (R26.2)     Time: 0677-0340 PT Time Calculation (min) (ACUTE ONLY): 31 min  Charges:  $Therapeutic Exercise: 8-22 mins $Therapeutic Activity: 8-22 mins                     Perrin Maltese, PT, DPT Acute Rehabilitation Services Pager [phone] Office [phone]    [name] 09/23/2020, 12:24 PM

## 2020-09-23 NOTE — Progress Notes (Signed)
Dialysis RN tells me that they have now called him for dialysis 3 times and he has refused -> will reschedule his dialysis for tomorrow.  Veneta Penton, PA-C [employer] Pager [phone]

## 2020-09-23 NOTE — Progress Notes (Signed)
[name]  PROGRESS NOTE  [name] RKY:[id number] DOB: January 16, 1981 DOA: 09/10/2020 PCP: Kerin Perna, NP   Brief HPI:   39 year old male with history of ESRD, diabetes mellitus type 2, hypertension, left foot diabetic  ulcer who presented with left hand pain as well as erythema, swelling and warmth with drainage of the left foot.  Patient underwent left BKA vascular surgery and incision and drainage of wrist abscess.    Subjective   Patient seen and examined, wants to go home.  Denies any complaints.   Assessment/Plan:     1. 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  Vascular surgery was consulted patient was taken to the OR on 09/16/2020 underwent left BKA.  Intraoperative report showed extensive gangrenous muscle and purulent collection of the left tibia.  Intraoperative cultures growing Enterococcus, Proteus which are pansensitive.  Patient is currently on IV cefazolin.  Plan for 1 more week of antibiotics with hemodialysis. 2. Left wrist abscess-hand surgeon Dr. Maryan Rued was consulted, he was taken to the OR on 09/15/2020.  Underwent incision and drainage.  Cultures grew MSSA, he was placed on Ancef with hemodialysis.  He will need 1 more week of IV antibiotics with hemodialysis.  Follow-up with orthopedics and vascular surgery. 3. ESRD-patient is on hemodialysis Monday Wednesday Friday.  Nephrology is following.  Plan was to get AV graft on Tuesday, 09/24/2020 however patient has refused surgery and wanted to be rescheduled at a later date.  He refused dialysis today.  Nephrology is planning for hemodialysis in a.m. 4. Diabetes mellitus type 2-poorly controlled, continue sliding scale insulin with NovoLog.  Will increase Lantus to 10 units subcu daily. 5. Anemia chronic kidney disease-hemoglobin stable at 7.0.  Will likely need 1 unit of PRBC with hemodialysis in a.m. 6. ?  Bipolar disorder-patient had episode of aggression, psychosis,  threatening behavior towards self and staff.  Security had to be called in on 09/20/2020.  He was IVCD, psychiatry was consulted. 7. He was very apologetic.  IVC was discontinued. 8. Disposition-remains a problem, patient does not have appropriate services at home, no home health RN or PT can be arranged considering patient's aggressive behavior.  Patient wants to go home however he will not be able to get all the services at home.  At this time the best option will be to send him to skilled nursing facility.  [name] is agreeable to send him to skilled nursing facility, however patient is refusing.  We will continue to have ongoing discussion with social work, case management and patient family.   COVID-19 Labs  No results for input(s): DDIMER, FERRITIN, LDH, CRP in the last 72 hours.  Lab Results  Component Value Date   SARSCOV2NAA NEGATIVE 09/11/2020   SARSCOV2NAA NEGATIVE 07/22/2020   SARSCOV2NAA POSITIVE (A) 02/14/2020   [place] NEGATIVE 01/31/2020     Scheduled medications:   . (feeding supplement) PROSource Plus  30 mL Oral TID BM  . calcitRIOL  2.5 mcg Oral Q M,W,F-HD  . Chlorhexidine Gluconate Cloth  6 each Topical Q0600  . Chlorhexidine Gluconate Cloth  6 each Topical Q0600  . cinacalcet  60 mg Oral Q breakfast  . darbepoetin (ARANESP) injection - DIALYSIS  60 mcg Intravenous Q Fri-HD  . heparin  5,000 Units Subcutaneous Q8H  . heparin sodium (porcine)  1,000 Units Intravenous Q M,W,F-HD  . insulin aspart  0-5 Units Subcutaneous QHS  . insulin aspart  0-6 Units Subcutaneous TID WC  .  insulin glargine  6 Units Subcutaneous QHS  . multivitamin  1 tablet Oral QHS  . sevelamer carbonate  4,000 mg Oral TID WC         CBG: Recent Labs  Lab 09/22/20 1158 09/22/20 1650 09/22/20 2218 09/23/20 0655 09/23/20 1138  GLUCAP 219* 247* 119* 207* 347*    SpO2: 96 % O2 Flow Rate (L/min): 2 L/min    CBC: Recent Labs  Lab 09/17/20 0428 09/19/20 1152  09/21/20 0847 09/22/20 1931 09/23/20 0655  WBC 31.4* 38.9* 36.8* 20.6* 20.7*  HGB 8.0* 7.3* 7.4* 6.9* 7.0*  HCT 25.6* 23.3* 25.9* 23.1* 23.7*  MCV 91.4 92.8 96.6 93.1 95.2  PLT 231 213 245 225 989    Basic Metabolic Panel: Recent Labs  Lab 09/17/20 0428 09/19/20 1152 09/21/20 0847 09/22/20 1931 09/23/20 0655  NA 133* 129* 129* 129* 133*  K 4.0 5.2* 4.4 4.0 3.8  CL 96* 93* 90* 88* 93*  CO2 23 15* 11* 23 24  GLUCOSE 408* 291* 370* 288* 239*  BUN 33* 63* 47* 59* 27*  CREATININE 5.97* 9.10* 7.34* 8.56* 5.29*  CALCIUM 8.8* 8.9 8.0* 7.8* 7.9*  PHOS 4.5 6.5*  --  6.3*  --      Liver Function Tests: Recent Labs  Lab 09/17/20 0428 09/19/20 1152 09/21/20 0847 09/22/20 1931  AST  --   --  17 12*  ALT  --   --  22 19  ALKPHOS  --   --  187* 194*  BILITOT  --   --  1.7* 0.6  PROT  --   --  6.5 6.6  ALBUMIN 2.0* 1.8* 1.8* 1.8*     Antibiotics: Anti-infectives (From admission, onward)   Start     Dose/Rate Route Frequency Ordered Stop   09/23/20 1200  ceFAZolin (ANCEF) IVPB 2g/100 mL premix       "Followed by" Linked Group Details   2 g 200 mL/hr over 30 Minutes Intravenous Every M-W-F (Hemodialysis) 09/22/20 1029     09/22/20 1115  ceFAZolin (ANCEF) IVPB 2g/100 mL premix       "Followed by" Linked Group Details   2 g 200 mL/hr over 30 Minutes Intravenous  Once 09/22/20 1029 09/22/20 2318   09/19/20 1254  Vancomycin (VANCOCIN) 750-5 MG/150ML-% IVPB       Note to Pharmacy: Judieth Keens  : cabinet override      09/19/20 1254 09/19/20 1317   09/19/20 1200  vancomycin (VANCOCIN) IVPB 750 mg/150 ml premix        750 mg 150 mL/hr over 60 Minutes Intravenous Every T-Th-Sa (Hemodialysis) 09/19/20 1034 09/19/20 1414   09/18/20 0800  Ampicillin-Sulbactam (UNASYN) 3 g in sodium chloride 0.9 % 100 mL IVPB  Status:  Discontinued        3 g 200 mL/hr over 30 Minutes Intravenous Every 12 hours 09/17/20 1226 09/22/20 1029   09/13/20 0933  Vancomycin (VANCOCIN) 750-5  MG/150ML-% IVPB       Note to Pharmacy: Herriott, Melisa   : cabinet override      09/13/20 0933 09/13/20 2144   09/11/20 1200  vancomycin (VANCOCIN) IVPB 750 mg/150 ml premix  Status:  Discontinued        750 mg 150 mL/hr over 60 Minutes Intravenous Every M-W-F (Hemodialysis) 09/11/20 0238 09/20/20 1134   09/11/20 1130  ceFEPIme (MAXIPIME) 1 g in sodium chloride 0.9 % 100 mL IVPB        1 g 200 mL/hr over 30 Minutes Intravenous Every 24  hours 09/11/20 0919 09/17/20 0921   09/11/20 0300  piperacillin-tazobactam (ZOSYN) IVPB 2.25 g  Status:  Discontinued        2.25 g 100 mL/hr over 30 Minutes Intravenous Every 8 hours 09/11/20 0238 09/11/20 0858   09/11/20 0300  vancomycin (VANCOREADY) IVPB 1250 mg/250 mL        1,250 mg 166.7 mL/hr over 90 Minutes Intravenous  Once 09/11/20 0238 09/11/20 0613       DVT prophylaxis: SCDs  Code Status: Full code  Family Communication: No family at bedside   Consultants:    Procedures:      Objective   Vitals:   09/22/20 2217 09/23/20 0527 09/23/20 0814 09/23/20 1549  BP: 129/82 122/82 118/75 (!) 124/96  Pulse: 98 81 87 89  Resp: 18 18 16 14   Temp: 98 F (36.7 C) 98.3 F (36.8 C) 98 F (36.7 C) 97.8 F (36.6 C)  TempSrc: Oral Oral Oral Oral  SpO2: 100% 95% 94% 96%  Weight:      Height:        Intake/Output Summary (Last 24 hours) at 09/23/2020 1600 Last data filed at 09/22/2020 2145 Gross per 24 hour  Intake --  Output 1500 ml  Net -1500 ml    12/25 1901 - 12/27 0700 In: 980 [P.O.:680] Out: 1500   Filed Weights   09/16/20 1056 09/19/20 1006 09/19/20 1420  Weight: 67.9 kg 68.9 kg 66.4 kg    Physical Examination:    General-appears in no acute distress  Heart-S1-S2, regular, no murmur auscultated  Lungs-clear to auscultation bilaterally, no wheezing or crackles auscultated  Abdomen-soft, nontender, no organomegaly  Extremities-s/p left BKA  Neuro-alert, oriented x3, no focal deficit noted   Status  is: Inpatient  Dispo: The patient is from: Home              Anticipated d/c is to: Home versus skilled nursing facility              Anticipated d/c date is: 09/25/2020              Patient currently not stable for discharge  Barrier to discharge-unsafe discharge        Data Reviewed:   Recent Results (from the past 240 hour(s))  Aerobic/Anaerobic Culture (surgical/deep wound)     Status: None   Collection Time: 09/15/20  1:26 AM   Specimen: Wound; Abscess  Result Value Ref Range Status   Specimen Description WOUND LEFT WRIST  Final   Special Requests PT ON VANCOMYCIN  Final   Gram Stain NO WBC SEEN NO ORGANISMS SEEN   Final   Culture   Final    MODERATE STAPHYLOCOCCUS AUREUS ABUNDANT DIPHTHEROIDS(CORYNEBACTERIUM SPECIES) Standardized susceptibility testing for this organism is not available. NO ANAEROBES ISOLATED Performed at [facility], [place] [address] [place], [place] [address]    Report Status 09/20/2020 FINAL  Final   Organism ID, Bacteria STAPHYLOCOCCUS AUREUS  Final      Susceptibility   Staphylococcus aureus - MIC*    CIPROFLOXACIN <=0.5 SENSITIVE Sensitive     ERYTHROMYCIN >=8 RESISTANT Resistant     GENTAMICIN <=0.5 SENSITIVE Sensitive     OXACILLIN <=0.25 SENSITIVE Sensitive     TETRACYCLINE >=16 RESISTANT Resistant     VANCOMYCIN <=0.5 SENSITIVE Sensitive     TRIMETH/SULFA <=10 SENSITIVE Sensitive     CLINDAMYCIN >=8 RESISTANT Resistant     RIFAMPIN <=0.5 SENSITIVE Sensitive     Inducible Clindamycin NEGATIVE Sensitive     *  MODERATE STAPHYLOCOCCUS AUREUS  MRSA PCR Screening     Status: None   Collection Time: 09/16/20  5:08 AM   Specimen: Nasal Mucosa; Nasopharyngeal  Result Value Ref Range Status   MRSA by PCR NEGATIVE NEGATIVE Final    Comment:        The GeneXpert MRSA Assay (FDA approved for NASAL specimens only), is one component of a comprehensive MRSA colonization surveillance program. It is not intended to diagnose  MRSA infection nor to guide or monitor treatment for MRSA infections. Performed at [facility], [place] [address] [place], [place] [address]   Anaerobic culture     Status: None   Collection Time: 09/16/20  3:49 PM   Specimen: Leg, Left; Wound  Result Value Ref Range Status   Specimen Description WOUND LEFT LEG  Final   Special Requests PT ON CEFEPIME LEFT LEG ANTERIOR COMPARTMENT  Final   Culture   Final    FEW BACTEROIDES THETAIOTAOMICRON BETA LACTAMASE POSITIVE Performed at [facility], [address], [place], [place] [address]    Report Status 09/19/2020 FINAL  Final  Aerobic Culture (superficial specimen)     Status: None   Collection Time: 09/16/20  3:49 PM   Specimen: Leg, Left; Wound  Result Value Ref Range Status   Specimen Description WOUND LEFT LEG  Final   Special Requests PT ON CEFEPIME LEFT LEG ANTERIOR COMPARTMENT  Final   Gram Stain   Final    NO WBC SEEN NO ORGANISMS SEEN Performed at [facility], [address] [place], [place] [address]    Culture FEW ENTEROCOCCUS FAECALIS FEW PROTEUS MIRABILIS   Final   Report Status 09/20/2020 FINAL  Final   Organism ID, Bacteria ENTEROCOCCUS FAECALIS  Final   Organism ID, Bacteria PROTEUS MIRABILIS  Final      Susceptibility   Enterococcus faecalis - MIC*    AMPICILLIN <=2 SENSITIVE Sensitive     VANCOMYCIN 1 SENSITIVE Sensitive     GENTAMICIN SYNERGY SENSITIVE Sensitive     * FEW ENTEROCOCCUS FAECALIS   Proteus mirabilis - MIC*    AMPICILLIN <=2 SENSITIVE Sensitive     CEFAZOLIN <=4 SENSITIVE Sensitive     CEFEPIME <=0.12 SENSITIVE Sensitive     CEFTAZIDIME <=1 SENSITIVE Sensitive     CEFTRIAXONE <=0.25 SENSITIVE Sensitive     CIPROFLOXACIN <=0.25 SENSITIVE Sensitive     GENTAMICIN <=1 SENSITIVE Sensitive     IMIPENEM 2 SENSITIVE Sensitive     TRIMETH/SULFA <=20 SENSITIVE Sensitive     AMPICILLIN/SULBACTAM <=2 SENSITIVE Sensitive     PIP/TAZO <=4 SENSITIVE Sensitive     * FEW PROTEUS  MIRABILIS  Culture, blood (Routine X 2) w Reflex to ID Panel     Status: None (Preliminary result)   Collection Time: 09/21/20  8:51 AM   Specimen: BLOOD  Result Value Ref Range Status   Specimen Description BLOOD RIGHT ANTECUBITAL  Final   Special Requests   Final    BOTTLES DRAWN AEROBIC AND ANAEROBIC Blood Culture adequate volume   Culture   Final    NO GROWTH 2 DAYS Performed at [facility], [address], [place], [place] [address]    Report Status PENDING  Incomplete  Culture, blood (Routine X 2) w Reflex to ID Panel     Status: None (Preliminary result)   Collection Time: 09/21/20  8:51 AM   Specimen: BLOOD  Result Value Ref Range Status   Specimen Description BLOOD RIGHT  ANTECUBITAL  Final   Special Requests   Final    BOTTLES DRAWN AEROBIC AND ANAEROBIC Blood Culture results may not be optimal due to an inadequate volume of blood received in culture bottles   Culture   Final    NO GROWTH 2 DAYS Performed at [facility], [place] [address] [place], [place] [address]    Report Status PENDING  Incomplete        Oswald Hillock   [facility] If 7PM-7AM, please contact night-coverage at [email or web address], Office  [phone]   09/23/2020, 4:00 PM  LOS: 12 days

## 2020-09-23 NOTE — Plan of Care (Deleted)
  Problem: Education: Goal: Knowledge of General Education information will improve Description: Including pain rating scale, medication(s)/side effects and non-pharmacologic comfort measures 09/23/2020 0025 by Trixie Deis, RN Outcome: Progressing   Problem: Activity: Goal: Risk for activity intolerance will decrease 09/23/2020 0025 by Trixie Deis, RN Outcome: Progressing   Problem: Pain Managment: Goal: General experience of comfort will improve 09/23/2020 0025 by Trixie Deis, RN Outcome: Progressing   Problem: Safety: Goal: Ability to remain free from injury will improve 09/23/2020 0025 by Trixie Deis, RN Outcome: Progressing   Problem: Skin Integrity: Goal: Risk for impaired skin integrity will decrease 09/23/2020 0025 by Trixie Deis, RN Outcome: Progressing

## 2020-09-23 NOTE — Progress Notes (Signed)
Physical Therapy Wound Treatment Patient Details  Name: [name] MRN: [id number] Date of Birth: 12-09-1980  Today's Date: 09/23/2020 Time: 1340-1430 Time Calculation (min): 50 min  Subjective  Subjective: Reports abscess showed up about 2 weeks prior to surgical drainage performed on 09/15/20.  Reports pain 7/10, pt premedicated. Patient and Family Stated Goals: Heal the wound Date of Onset: 09/15/20 Prior Treatments: surgical I&D 12/19  Pain Score: Tolerated treatment well with premedication.  Wound Assessment  Wound / Incision (Open or Dehisced) 09/21/20 Incision - Open Arm Left;Lower;Medial;Anterior open incision boarded by sutures, with exposed tendon (Active)  Dressing Type Compression wrap;Gauze (Comment);Moist to dry 09/23/20 1300  Dressing Changed Changed 09/23/20 1300  Dressing Status Clean;Dry;Intact 09/23/20 1300  Dressing Change Frequency Daily 09/23/20 1300  Site / Wound Assessment Yellow;Pale 09/23/20 1300  % Wound base Red or Granulating 60% 09/23/20 1300  % Wound base Yellow/Fibrinous Exudate 20% 09/23/20 1300  % Wound base Black/Eschar 0% 09/23/20 1300  % Wound base Other/Granulation Tissue (Comment) 20% 09/23/20 1300  Peri-wound Assessment Intact 09/23/20 1300  Wound Length (cm) 2.6 cm 09/21/20 1000  Wound Width (cm) 1.3 cm 09/21/20 1000  Wound Surface Area (cm^2) 3.38 cm^2 09/21/20 1000  Undermining (cm) 1.2 09/21/20 1000  Margins Unattached edges (unapproximated) 09/23/20 1300  Closure Sutures 09/23/20 1300  Drainage Amount Minimal 09/23/20 1300  Drainage Description Serosanguineous 09/23/20 1300  Non-staged Wound Description Full thickness 09/23/20 1300  Treatment Hydrotherapy (Pulse lavage);Packing (Impregnated strip) 09/23/20 1300      Hydrotherapy Pulsed lavage therapy - wound location: L wrist Pulsed Lavage with Suction (psi): 4 psi Pulsed Lavage with Suction - Normal Saline Used: 500 mL Pulsed Lavage Tip: Tip with splash shield   Wound  Assessment and Plan  Wound Therapy - Assess/Plan/Recommendations Wound Therapy - Clinical Statement: Wound bed appears improved today. Pt tolerated treatment well with IV pain meds. Will continue to follow to decrease bioburden and promote wound bed healing. Wound Therapy - Functional Problem List: Decreased mobility, decreased wound healing, peripheral arterial disease Factors Delaying/Impairing Wound Healing: Diabetes Mellitus;Vascular compromise;Multiple medical problems Hydrotherapy Plan: Debridement;Dressing change;Pulsatile lavage with suction;Patient/family education Wound Therapy - Frequency: 5X / week Wound Therapy - Current Recommendations: Other (comment) (none) Wound Therapy - Follow Up Recommendations: Home health RN  Wound Therapy Goals- Improve the function of patient's integumentary system by progressing the wound(s) through the phases of wound healing (inflammation - proliferation - remodeling) by: Decrease Necrotic Tissue to: 10 Decrease Necrotic Tissue - Progress: Progressing toward goal Increase Granulation Tissue to: 70 Increase Granulation Tissue - Progress: Progressing toward goal Additional Wound Therapy Goal: decrease undermining to <1 cm Additional Wound Therapy Goal - Progress: Progressing toward goal Goals/treatment plan/discharge plan were made with and agreed upon by patient/family: Yes Time For Goal Achievement: 7 days Wound Therapy - Potential for Goals: Excellent  Goals will be updated until maximal potential achieved or discharge criteria met.  Discharge criteria: when goals achieved, discharge from hospital, MD decision/surgical intervention, no progress towards goals, refusal/missing three consecutive treatments without notification or medical reason.  GP     Thelma Comp 09/23/2020, 2:30 PM   Rolinda Roan, PT, DPT Acute Rehabilitation Services Pager: [phone] Office: [phone]

## 2020-09-23 NOTE — Progress Notes (Signed)
Floor nurse was called to get report if the patient wants to be dialyze today. Patient agreed but he wants later on.

## 2020-09-23 NOTE — Plan of Care (Addendum)
Pt arrived back for dialysis stable. Per hand off from dialysis RN [place], pt Hgb 6.9, dialysis RN notified nephrology about low Hgb. Will continue to monitor patient.   Randol Kern aware of low Hgb, states nephrology will follow up in the morning. Will pass on to day shift to make sure nephrology is aware.   Problem: Education: Goal: Knowledge of General Education information will improve Description: Including pain rating scale, medication(s)/side effects and non-pharmacologic comfort measures Outcome: Progressing   Problem: Activity: Goal: Risk for activity intolerance will decrease Outcome: Progressing   Problem: Pain Managment: Goal: General experience of comfort will improve Outcome: Progressing   Problem: Safety: Goal: Ability to remain free from injury will improve Outcome: Progressing   Problem: Skin Integrity: Goal: Risk for impaired skin integrity will decrease Outcome: Progressing

## 2020-09-23 NOTE — Progress Notes (Signed)
Nutrition Follow-up  DOCUMENTATION CODES:   Non-severe (moderate) malnutrition in context of chronic illness  INTERVENTION:   -Continue liberalized diet of regular -Continue renal MVI daily -Continue 30 ml Prosource Plus TID, each supplement provides 100 kcals and 15 grams protein  NUTRITION DIAGNOSIS:   Moderate Malnutrition related to chronic illness (ESRD on HD) as evidenced by mild fat depletion,moderate fat depletion,moderate muscle depletion,severe muscle depletion.  Ongoing  GOAL:   Patient will meet greater than or equal to 90% of their needs  Progressing   MONITOR:   PO intake,Supplement acceptance,Labs,Weight trends,Skin,I & O's  REASON FOR ASSESSMENT:   Consult Diet education  ASSESSMENT:   [name] is a 39 y.o. 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  States he has been seeing podiatry about this, Dr. March Rummage.  States wound seems to be worsening and is largere. Now foot has started to become red and warm to the touch with increased pain. Has developed a foul smelling drainage in past few days. He reports subjective fever and intermittent chills the past day. States he has been compliant with his dialysis treatment, due for treatment again tomorrow. He has missed some follow up with podiatry he states. Xray shows osteomyelitis in foot and pt is started on empiric antibiotics in the ER. He has elevated WBC on labs.   12/18- MRI of lt wrist region reveals early abscess vs phlegmon 12/19- s/p PROCEDURE:Incision and drainage left wrist lesion; cultures sent; plan to start hydrotherapy 3-4 days post-op 12/20- s/p  PROCEDURE:Left below-knee amputation 12/25- hydrotherapy initiated to lt wrist abscess Reviewed I/O's:-960 ml x 24 hours and -3.2 L since admission   Pt resting quietly in room at time of  visit. RD did not disturb. Per chart review, pt has been verbally abusive towards staff at times.   Pt with erratic intake. Noted meal completion 15-100%. Pt is taking his Prosource supplements.   Per TOC notes, recommending SNF placement.   Medications reviewed and include renvela.   Labs reviewed: Na: 133, K WDL, Phos: 16, CBGS: 119-347 (inpatient orders for glycemic control are 0-5 units insulin aspart daily at bedtime, 0-6 units insulin aspart TID with meals, and 6 units insulin glargine daily at bedtime).   Diet Order:   Diet Order            Diet regular Room service appropriate? Yes; Fluid consistency: Thin  Diet effective now                 EDUCATION NEEDS:   Education needs have been addressed  Skin:  Skin Assessment: Reviewed RN Assessment  Last BM:  09/22/20  Height:   Ht Readings from Last 1 Encounters:  09/10/20 5\' 7"  (1.702 m)    Weight:   Wt Readings from Last 1 Encounters:  09/19/20 66.4 kg    Ideal Body Weight:  62.9 kg (adjusted for lt BKA)  BMI:  Body mass index is 22.93 kg/m.  Estimated Nutritional Needs:   Kcal:  2050-2250  Protein:  105-120 grams  Fluid:  1000 ml + UOP    Loistine Chance, RD, LDN, [place] Registered Dietitian II Certified Diabetes Care and Education Specialist Please refer to [facility] for RD and/or RD on-call/weekend/after hours pager

## 2020-09-23 NOTE — TOC Progression Note (Signed)
Transition of Care [facility]) - Progression Note    Patient Details  Name: SANCHEZ HEMMER MRN: [id number] Date of Birth: [birth year]  Transition of Care [facility]) CM/SW [place], RN Phone Number: 09/23/2020, 2:52 PM  Clinical Narrative:    Case management met with the patient at the bedside regarding transitions of care and the patient verbalizes that he is declining SNF and wants to go home.  The patient lives in a second story apartment with no access to an emergency elevator.  The patient's mother works at night at [employer] and is unable to provide 24 hour supervision and assistance.  The patient has 13 metal steps to climb to mobilize to the bottom floor at this time.  I spoke with the apartment manager at the complex and the patient would not be able to transfer to a downstairs apartment at this time due to no available units.  The patient's mother agrees that he is an unsafe discharge to home since she is unable to provide 24 hour supervisions, unable to establish a home health agency to accept care of him at home for complex dressing changes and patient's inability to mobilize the steps to get downstairs for HD transportation.  The patient has an available aide 7 days a week from 7 pm to 9:45 pm through [facility], home health aide - [phone].  I spoke with Dr. Lisabeth Devoid and he is aware that the patient remains an unsafe discharge at this time due to multiple medical reasons and insufficient care access at home.  Patient is currently refusing SNF placement at this time.  The patient's mother agrees that patient needs SNF placement.  Patient is unable to discharge home safely.   Expected Discharge Plan: Home/Self Care Barriers to Discharge: Continued Medical Work up  Expected Discharge Plan and Services Expected Discharge Plan: Home/Self Care   Discharge Planning Services: CM Consult                                           Social  Determinants of Health (SDOH) Interventions    Readmission Risk Interventions No flowsheet data found.

## 2020-09-23 NOTE — Progress Notes (Signed)
Pt wishes to reschedule his AV fistula.  Will cancel for tomorrow and set up appt for me to see him in 3-4 weeks.  Operation for tomorrow cancelled.  Ruta Hinds, MD [facility] [place] Office: [phone]

## 2020-09-23 NOTE — Progress Notes (Signed)
[facility] Progress Note   Subjective:  S/p behavioral issues/psychosis over past few days - required short-term IVC order + psych consult. Calm + apologetic today. Called for dialysis, but he sister was on the way to visit and he was hoping to visit today and do dialysis later - will try to accommodate.  Objective Vitals:   09/22/20 2145 09/22/20 2217 09/23/20 0527 09/23/20 0814  BP: 130/84 129/82 122/82 118/75  Pulse: 77 98 81 87  Resp: 18 18 18 16   Temp: 98.6 F (37 C) 98 F (36.7 C) 98.3 F (36.8 C) 98 F (36.7 C)  TempSrc: Oral Oral Oral Oral  SpO2: 100% 100% 95% 94%  Weight:      Height:       Physical Exam General: Well appearing, calm man. NAD. Room air. Heart: RRR; no murmur Lungs: CTA anteriorly Abdomen: soft Extremities: L BKA, RLE without edema Dialysis Access:   Additional Objective Labs: Basic Metabolic Panel: Recent Labs  Lab 09/17/20 0428 09/19/20 1152 09/21/20 0847 09/22/20 1931 09/23/20 0655  NA 133* 129* 129* 129* 133*  K 4.0 5.2* 4.4 4.0 3.8  CL 96* 93* 90* 88* 93*  CO2 23 15* 11* 23 24  GLUCOSE 408* 291* 370* 288* 239*  BUN 33* 63* 47* 59* 27*  CREATININE 5.97* 9.10* 7.34* 8.56* 5.29*  CALCIUM 8.8* 8.9 8.0* 7.8* 7.9*  PHOS 4.5 6.5*  --  6.3*  --    Liver Function Tests: Recent Labs  Lab 09/19/20 1152 09/21/20 0847 09/22/20 1931  AST  --  17 12*  ALT  --  22 19  ALKPHOS  --  187* 194*  BILITOT  --  1.7* 0.6  PROT  --  6.5 6.6  ALBUMIN 1.8* 1.8* 1.8*   CBC: Recent Labs  Lab 09/17/20 0428 09/19/20 1152 09/21/20 0847 09/22/20 1931 09/23/20 0655  WBC 31.4* 38.9* 36.8* 20.6* 20.7*  HGB 8.0* 7.3* 7.4* 6.9* 7.0*  HCT 25.6* 23.3* 25.9* 23.1* 23.7*  MCV 91.4 92.8 96.6 93.1 95.2  PLT 231 213 245 225 217   Blood Culture    Component Value Date/Time   SDES BLOOD RIGHT ANTECUBITAL 09/21/2020 0851   SDES BLOOD RIGHT ANTECUBITAL 09/21/2020 0851   SPECREQUEST  09/21/2020 0851    BOTTLES DRAWN AEROBIC AND ANAEROBIC  Blood Culture adequate volume   SPECREQUEST  09/21/2020 0851    BOTTLES DRAWN AEROBIC AND ANAEROBIC Blood Culture results may not be optimal due to an inadequate volume of blood received in culture bottles   CULT  09/21/2020 0851    NO GROWTH 2 DAYS Performed at [facility] [address] [place], [place] [address]    CULT  09/21/2020 [id number]    NO GROWTH 2 DAYS Performed at [facility], [place] [address], [place], [place] [address]    REPTSTATUS PENDING 09/21/2020 0851   REPTSTATUS PENDING 09/21/2020 0851   Medications: .  ceFAZolin (ANCEF) IV     . (feeding supplement) PROSource Plus  30 mL Oral TID BM  . calcitRIOL  2.5 mcg Oral Q M,W,F-HD  . Chlorhexidine Gluconate Cloth  6 each Topical Q0600  . Chlorhexidine Gluconate Cloth  6 each Topical Q0600  . cinacalcet  60 mg Oral Q breakfast  . darbepoetin (ARANESP) injection - DIALYSIS  60 mcg Intravenous Q Fri-HD  . heparin  5,000 Units Subcutaneous Q8H  . heparin sodium (porcine)  1,000 Units Intravenous Q M,W,F-HD  . insulin aspart  0-5 Units Subcutaneous QHS  .  insulin aspart  0-6 Units Subcutaneous TID WC  . insulin glargine  6 Units Subcutaneous QHS  . multivitamin  1 tablet Oral QHS  . sevelamer carbonate  4,000 mg Oral TID WC    Dialysis Orders: MWF @ [facility] 4:15hr, 2K/2Ca, 400/800, EDW 67kg, TDC, heparin 5000 + 5000 mid-run bolus - Calcitriol 74mcg PO q HD - No ESA ** Scheduled for R BC AVF on 09/24/20, now delayed per patient request.  Assessment/Plan: 1. 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 On Cefazolin. Ongoing pain issues. VVS following, small area of wound dehissence 2. ESRD: Will continue HD per MWF schedule - refused AM dialysis d/t family visiting, will circle back this afternoon or tomorrow morning. 3. HTN/volume: BP stable without edema at this time. 4. Anemia: Hgb 7 - given Aranesp 34mcg on 12/24 - follow trend, will ^ next dose. 5. Secondary hyperparathyroidism: CorrCa ok, Phos high  - continue binders/VDRA/sensipar. 6. Nutrition: Alb low - continue protein supplements. 7. Bipolar disorder + underlying personality disorder: Severe aggression last week - improved over past few days. Psych consulted on 12/24 - did not meet criteria for inpatient psych. 8. L wrist abscess: S/p I&D 12/19 - will need 1 more week abx per notes. 9. T2DM: Chronically uncontrolled.  Veneta Penton, PA-C 09/23/2020, 10:49 AM  [employer]

## 2020-09-23 NOTE — Progress Notes (Signed)
Progress Note    09/23/2020 8:08 AM 7 Days Post-Op  Subjective:  C/o of left residual limb pain particularly with dressing changes. Sitting up in bed eating breakfast   Vitals:   09/22/20 2217 09/23/20 0527  BP: 129/82 122/82  Pulse: 98 81  Resp: 18 18  Temp: 98 F (36.7 C) 98.3 F (36.8 C)  SpO2: 100% 95%    Physical Exam: Cardiac:  RRR Lungs:  Non-labored Incisions:  Left BKA amp site: approx 3 x 5 cm skin edge separation; remaining staple line intact. Underlying tissue red without surrounding erythema or purulent drainage     Component 7 d ago  Specimen Description WOUND LEFT LEG   Special Requests PT ON CEFEPIME LEFT LEG ANTERIOR COMPARTMENT   Gram Stain NO WBC SEEN  NO ORGANISMS SEEN  Performed at [facility], [place] [address], [place], [place] [address]   Culture FEW ENTEROCOCCUS FAECALIS  FEW PROTEUS MIRABILIS   Report Status 09/20/2020 FINAL   Organism ID, Bacteria ENTEROCOCCUS FAECALIS   Organism ID, Bacteria PROTEUS MIRABILIS      CBC    Component Value Date/Time   WBC 20.7 (H) 09/23/2020 0655   RBC 2.49 (L) 09/23/2020 0655   HGB 7.0 (L) 09/23/2020 0655   HGB 9.7 (L) 08/22/2014 0447   HCT 23.7 (L) 09/23/2020 0655   HCT 30.0 (L) 08/22/2014 0447   PLT 217 09/23/2020 0655   PLT 158 08/22/2014 0447   MCV 95.2 09/23/2020 0655   MCV 97 08/22/2014 0447   MCH 28.1 09/23/2020 0655   MCHC 29.5 (L) 09/23/2020 0655   RDW 16.7 (H) 09/23/2020 0655   RDW 15.3 (H) 08/22/2014 0447   LYMPHSABS 1.0 09/15/2020 0424   LYMPHSABS 1.7 08/22/2014 0447   MONOABS 2.4 (H) 09/15/2020 0424   MONOABS 0.8 08/22/2014 0447   EOSABS 0.2 09/15/2020 0424   EOSABS 0.4 08/22/2014 0447   BASOSABS 0.2 (H) 09/15/2020 0424   BASOSABS 0.1 08/22/2014 0447    BMET    Component Value Date/Time   NA 129 (L) 09/22/2020 1931   NA 138 08/22/2014 0447   K 4.0 09/22/2020 1931   K 4.0 08/22/2014 0447   CL 88 (L) 09/22/2020 1931   CL 100 08/22/2014 0447   CO2 23 09/22/2020  1931   CO2 30 08/22/2014 0447   GLUCOSE 288 (H) 09/22/2020 1931   GLUCOSE 88 08/22/2014 0447   BUN 59 (H) 09/22/2020 1931   BUN 14 08/22/2014 0447   CREATININE 8.56 (H) 09/22/2020 1931   CREATININE 5.00 (H) 08/22/2014 0447   CALCIUM 7.8 (L) 09/22/2020 1931   CALCIUM 8.2 (L) 08/22/2014 0447   GFRNONAA 7 (L) 09/22/2020 1931   GFRNONAA 14 (L) 08/22/2014 0447   GFRNONAA 5 (L) 06/19/2014 0937   GFRAA 7 (L) 05/30/2020 1923   GFRAA 17 (L) 08/22/2014 0447   GFRAA 6 (L) 06/19/2014 0937     Intake/Output Summary (Last 24 hours) at 09/23/2020 0808 Last data filed at 09/22/2020 2145 Gross per 24 hour  Intake 540 ml  Output 1500 ml  Net -960 ml    HOSPITAL MEDICATIONS Scheduled Meds:  (feeding supplement) PROSource Plus  30 mL Oral TID BM   calcitRIOL  2.5 mcg Oral Q M,W,F-HD   Chlorhexidine Gluconate Cloth  6 each Topical Q0600   Chlorhexidine Gluconate Cloth  6 each Topical Q0600   cinacalcet  60 mg Oral Q breakfast   darbepoetin (ARANESP) injection - DIALYSIS  60 mcg Intravenous Q Fri-HD  heparin  5,000 Units Subcutaneous Q8H   heparin sodium (porcine)  1,000 Units Intravenous Q M,W,F-HD   insulin aspart  0-5 Units Subcutaneous QHS   insulin aspart  0-6 Units Subcutaneous TID WC   insulin glargine  6 Units Subcutaneous QHS   multivitamin  1 tablet Oral QHS   sevelamer carbonate  4,000 mg Oral TID WC   Continuous Infusions:   ceFAZolin (ANCEF) IV     PRN Meds:.albuterol, haloperidol **OR** haloperidol lactate, HYDROmorphone (DILAUDID) injection, oxyCODONE, sevelamer carbonate  Assessment: POD 7 left BKA with subsequent incisional dehiscence. culture positive for few Enterococcus and Proteus>>pan-sensitive. BC no growth after 2 days.  ESRD-had TDC and needs right AVF vs AVG   Plan: -Continue local wound care and antibiotics. Wound appears improved. -HD access surgery timing to be determined  -DVT prophylaxis:  Heparin Neche   Risa Grill, PA-C [facility] [phone] 09/23/2020  8:08 AM

## 2020-09-24 ENCOUNTER — Ambulatory Visit (HOSPITAL_COMMUNITY): Admission: RE | Admit: 2020-09-24 | Payer: Medicare HMO | Source: Home / Self Care | Admitting: Vascular Surgery

## 2020-09-24 DIAGNOSIS — E1022 Type 1 diabetes mellitus with diabetic chronic kidney disease: Secondary | ICD-10-CM | POA: Diagnosis not present

## 2020-09-24 DIAGNOSIS — Z992 Dependence on renal dialysis: Secondary | ICD-10-CM | POA: Diagnosis not present

## 2020-09-24 DIAGNOSIS — E08621 Diabetes mellitus due to underlying condition with foot ulcer: Secondary | ICD-10-CM | POA: Diagnosis not present

## 2020-09-24 DIAGNOSIS — L97426 Non-pressure chronic ulcer of left heel and midfoot with bone involvement without evidence of necrosis: Secondary | ICD-10-CM | POA: Diagnosis not present

## 2020-09-24 DIAGNOSIS — N186 End stage renal disease: Secondary | ICD-10-CM | POA: Diagnosis not present

## 2020-09-24 DIAGNOSIS — F4320 Adjustment disorder, unspecified: Secondary | ICD-10-CM

## 2020-09-24 DIAGNOSIS — R69 Illness, unspecified: Secondary | ICD-10-CM | POA: Diagnosis not present

## 2020-09-24 DIAGNOSIS — M86172 Other acute osteomyelitis, left ankle and foot: Secondary | ICD-10-CM | POA: Diagnosis not present

## 2020-09-24 LAB — GLUCOSE, CAPILLARY
Glucose-Capillary: 170 mg/dL — ABNORMAL HIGH (ref 70–99)
Glucose-Capillary: 174 mg/dL — ABNORMAL HIGH (ref 70–99)
Glucose-Capillary: 180 mg/dL — ABNORMAL HIGH (ref 70–99)
Glucose-Capillary: 35 mg/dL — CL (ref 70–99)
Glucose-Capillary: 38 mg/dL — CL (ref 70–99)
Glucose-Capillary: 61 mg/dL — ABNORMAL LOW (ref 70–99)

## 2020-09-24 LAB — RENAL FUNCTION PANEL
Albumin: 1.7 g/dL — ABNORMAL LOW (ref 3.5–5.0)
Anion gap: 13 (ref 5–15)
BUN: 34 mg/dL — ABNORMAL HIGH (ref 6–20)
CO2: 25 mmol/L (ref 22–32)
Calcium: 7.9 mg/dL — ABNORMAL LOW (ref 8.9–10.3)
Chloride: 94 mmol/L — ABNORMAL LOW (ref 98–111)
Creatinine, Ser: 6.66 mg/dL — ABNORMAL HIGH (ref 0.61–1.24)
GFR, Estimated: 10 mL/min — ABNORMAL LOW (ref >60)
Glucose, Bld: 83 mg/dL (ref 70–99)
Phosphorus: 4.9 mg/dL — ABNORMAL HIGH (ref 2.5–4.6)
Potassium: 4 mmol/L (ref 3.5–5.1)
Sodium: 132 mmol/L — ABNORMAL LOW (ref 135–145)

## 2020-09-24 LAB — CBC
HCT: 23 % — ABNORMAL LOW (ref 39.0–52.0)
Hemoglobin: 6.7 g/dL — CL (ref 13.0–17.0)
MCH: 27.9 pg (ref 26.0–34.0)
MCHC: 29.1 g/dL — ABNORMAL LOW (ref 30.0–36.0)
MCV: 95.8 fL (ref 80.0–100.0)
Platelets: 228 10*3/uL (ref 150–400)
RBC: 2.4 MIL/uL — ABNORMAL LOW (ref 4.22–5.81)
RDW: 17.1 % — ABNORMAL HIGH (ref 11.5–15.5)
WBC: 18 10*3/uL — ABNORMAL HIGH (ref 4.0–10.5)
nRBC: 0.1 % (ref 0.0–0.2)

## 2020-09-24 LAB — ACID FAST SMEAR (AFB, MYCOBACTERIA): Acid Fast Smear: NEGATIVE

## 2020-09-24 LAB — PREPARE RBC (CROSSMATCH)

## 2020-09-24 SURGERY — ARTERIOVENOUS (AV) FISTULA CREATION
Anesthesia: Monitor Anesthesia Care | Laterality: Right

## 2020-09-24 MED ORDER — DEXTROSE 50 % IV SOLN
INTRAVENOUS | Status: AC
  Administered 2020-09-24: 50 mL
  Filled 2020-09-24: qty 50

## 2020-09-24 MED ORDER — ALTEPLASE 2 MG IJ SOLR: 2.0000 mg | Freq: Once | INTRAMUSCULAR | Status: DC | PRN

## 2020-09-24 MED ORDER — SODIUM CHLORIDE 0.9 % IV SOLN: 100.0000 mL | INTRAVENOUS | Status: DC | PRN

## 2020-09-24 MED ORDER — LIDOCAINE HCL (PF) 1 % IJ SOLN: 5.0000 mL | INTRAMUSCULAR | Status: DC | PRN

## 2020-09-24 MED ORDER — INSULIN GLARGINE 100 UNIT/ML ~~LOC~~ SOLN
6.0000 [IU] | Freq: Every day | SUBCUTANEOUS | Status: DC
  Administered 2020-09-24: 22:00:00 3 [IU] via SUBCUTANEOUS
  Filled 2020-09-24 (×2): qty 0.06

## 2020-09-24 MED ORDER — HEPARIN SODIUM (PORCINE) 1000 UNIT/ML DIALYSIS: 1000.0000 [IU] | INTRAMUSCULAR | Status: DC | PRN

## 2020-09-24 MED ORDER — PENTAFLUOROPROP-TETRAFLUOROETH EX AERO: 1.0000 "application " | INHALATION_SPRAY | CUTANEOUS | Status: DC | PRN

## 2020-09-24 MED ORDER — HYDROMORPHONE HCL 1 MG/ML IJ SOLN
INTRAMUSCULAR | Status: AC
  Administered 2020-09-24: 1 mg via INTRAVENOUS
  Filled 2020-09-24: qty 1

## 2020-09-24 MED ORDER — SODIUM CHLORIDE 0.9 % IV SOLN
100.0000 mL | INTRAVENOUS | Status: DC | PRN
Start: 2020-09-24 — End: 2020-09-24

## 2020-09-24 MED ORDER — LIDOCAINE-PRILOCAINE 2.5-2.5 % EX CREA: 1.0000 "application " | TOPICAL_CREAM | CUTANEOUS | Status: DC | PRN

## 2020-09-24 MED ORDER — DEXTROSE 50 % IV SOLN
INTRAVENOUS | Status: AC
  Administered 2020-09-24: 02:00:00 50 mL
  Filled 2020-09-24: qty 50

## 2020-09-24 MED ORDER — SODIUM CHLORIDE 0.9% IV SOLUTION: Freq: Once | INTRAVENOUS | Status: DC

## 2020-09-24 MED ORDER — DARBEPOETIN ALFA 150 MCG/0.3ML IJ SOSY
150.0000 ug | PREFILLED_SYRINGE | INTRAMUSCULAR | Status: DC
  Filled 2020-09-24: qty 0.3

## 2020-09-24 NOTE — Progress Notes (Signed)
Inpatient Diabetes Program Recommendations  AACE/ADA: New Consensus Statement on Inpatient Glycemic Control (2015)  Target Ranges:  Prepandial:   less than 140 mg/dL      Peak postprandial:   less than 180 mg/dL (1-2 hours)      Critically ill patients:  140 - 180 mg/dL   Lab Results  Component Value Date   GLUCAP 174 (H) 09/24/2020   HGBA1C 7.5 (H) 07/22/2020    Review of Glycemic Control Results for [name], [name] (MRN [id number]) as of 09/24/2020 10:35  Ref. Range 09/23/2020 11:38 09/23/2020 18:53 09/23/2020 21:19 09/24/2020 01:49 09/24/2020 02:07  Glucose-Capillary Latest Ref Range: 70 - 99 mg/dL 347 (H) Novolog 4 units 427 (H) Novolog 6 units 208 (H) Novolog 2 units 35 (LL) 174 (H)   Diabetes history: DM Outpatient Diabetes medications: Lantus 6 units q hs + Novolog 6 units bid ac meal  Current orders for Inpatient glycemic control: Lantus 10 units + Novolog 0-6 units tid correction + hs 0-5 units  Inpatient Diabetes Program Recommendations:   -Decrease Lantus to 6 units qd -D/C hs correction but check CBG @ hs Sent secure chat to Dr. Barth Kirks.  Thank you, Nani Gasser. Shyna Duignan, RN, MSN, CDE  Diabetes Coordinator Inpatient Glycemic Control Team Team Pager [phone] (8am-5pm) 09/24/2020 10:39 AM

## 2020-09-24 NOTE — Progress Notes (Signed)
Physical Therapy Wound Treatment Patient Details  Name: [name] MRN: [id number] Date of Birth: [birth year]  Today's Date: 09/24/2020 Time: 5929-2446 Time Calculation (min): 35 min  Subjective  Subjective: Nervous about pain - unable to get additional pain meds as he has had Dilaudid within the hour. Patient and Family Stated Goals: Heal the wound Date of Onset: 09/15/20 Prior Treatments: surgical I&D 12/19  Pain Score: Pt did not complain of pain throughout session.   Wound Assessment  Wound / Incision (Open or Dehisced) 09/21/20 Incision - Open Arm Left;Lower;Medial;Anterior open incision boarded by sutures, with exposed tendon (Active)  Dressing Type Compression wrap;Gauze (Comment);Moist to dry 09/24/20 1421  Dressing Changed Changed 09/24/20 1421  Dressing Status Clean;Dry;Intact 09/24/20 1421  Dressing Change Frequency Daily 09/24/20 1421  Site / Wound Assessment Yellow;Pink 09/24/20 1421  % Wound base Red or Granulating 60% 09/24/20 1421  % Wound base Yellow/Fibrinous Exudate 20% 09/24/20 1421  % Wound base Black/Eschar 0% 09/24/20 1421  % Wound base Other/Granulation Tissue (Comment) 20% 09/24/20 1421  Peri-wound Assessment Intact 09/24/20 1421  Wound Length (cm) 2.6 cm 09/21/20 1000  Wound Width (cm) 1.3 cm 09/21/20 1000  Wound Surface Area (cm^2) 3.38 cm^2 09/21/20 1000  Undermining (cm) 1.2 09/21/20 1000  Margins Unattached edges (unapproximated) 09/24/20 1421  Closure Sutures 09/24/20 1421  Drainage Amount Minimal 09/24/20 1421  Drainage Description Serosanguineous 09/24/20 1421  Non-staged Wound Description Full thickness 09/24/20 1421  Treatment Hydrotherapy (Pulse lavage);Packing (Impregnated strip) 09/24/20 1421      Hydrotherapy Pulsed lavage therapy - wound location: L wrist Pulsed Lavage with Suction (psi): 4 psi Pulsed Lavage with Suction - Normal Saline Used: 1000 mL Pulsed Lavage Tip: Tip with splash shield   Wound Assessment and Plan  Wound  Therapy - Assess/Plan/Recommendations Wound Therapy - Clinical Statement: Pt had Dilaudid within the hour and was unable to get additional pain meds prior to treatment starting. Pt voicing that he was very nervous about pain but was agreeable to proceed. Overall tolerated well without complaints. Coban wrap utilized today around wrist/forearm only as ACE was cumbersome and causing discomfort in thenar webspace. Will continue to follow to decrease bioburden and promote wound bed healing. Wound Therapy - Functional Problem List: Decreased mobility, decreased wound healing, peripheral arterial disease Factors Delaying/Impairing Wound Healing: Diabetes Mellitus;Vascular compromise;Multiple medical problems Hydrotherapy Plan: Debridement;Dressing change;Pulsatile lavage with suction;Patient/family education Wound Therapy - Frequency: 6X / week Wound Therapy - Follow Up Recommendations: Home health RN Wound Plan: See above  Wound Therapy Goals- Improve the function of patient's integumentary system by progressing the wound(s) through the phases of wound healing (inflammation - proliferation - remodeling) by: Decrease Necrotic Tissue to: 10 Decrease Necrotic Tissue - Progress: Progressing toward goal Increase Granulation Tissue to: 70 Increase Granulation Tissue - Progress: Progressing toward goal Additional Wound Therapy Goal: decrease undermining to <1 cm Additional Wound Therapy Goal - Progress: Progressing toward goal Goals/treatment plan/discharge plan were made with and agreed upon by patient/family: Yes Time For Goal Achievement: 7 days Wound Therapy - Potential for Goals: Excellent  Goals will be updated until maximal potential achieved or discharge criteria met.  Discharge criteria: when goals achieved, discharge from hospital, MD decision/surgical intervention, no progress towards goals, refusal/missing three consecutive treatments without notification or medical reason.  GP     Thelma Comp 09/24/2020, 2:31 PM   Rolinda Roan, PT, DPT Acute Rehabilitation Services Pager: [phone] Office: [phone]

## 2020-09-24 NOTE — Plan of Care (Addendum)
Hypoglycemic Event  CBG: 35  Treatment:66mL 50% dextrose per verbal order from on-call M. Denny  Symptoms: clammy skin  Follow-up CBG: Time:0207 CBG Result: 174  Possible Reasons for Event: change in Lantus does at bedtime  Comments/MD notified:Dr. Sharlet Salina notified and ordered 83mL dextrose 50% then recheck CBG shortly after  Will continue to monitor.    Problem: Education: Goal: Knowledge of General Education information will improve Description: Including pain rating scale, medication(s)/side effects and non-pharmacologic comfort measures Outcome: Progressing   Problem: Activity: Goal: Risk for activity intolerance will decrease Outcome: Progressing   Problem: Pain Managment: Goal: General experience of comfort will improve Outcome: Progressing   Problem: Safety: Goal: Ability to remain free from injury will improve Outcome: Progressing   Problem: Skin Integrity: Goal: Risk for impaired skin integrity will decrease Outcome: Progressing

## 2020-09-24 NOTE — Progress Notes (Signed)
[facility] Progress Note   Subjective:  Seen on HD today - as makeup from yesterday - 3L UFG and tolerating. Denies CP or dyspnea today. Hgb resulted at 6.7 today - discussed that would recommend 1U PRBCs, he initially wanted to think about it. Now agreeable - will order.  Objective Vitals:   09/24/20 0740 09/24/20 0745 09/24/20 0804 09/24/20 0805  BP: 110/86 122/85 121/85 121/85  Pulse: 76 78    Resp: 16  (!) 22 16  Temp: 98.2 F (36.8 C)     TempSrc: Oral     SpO2: 98%     Weight: 68.6 kg     Height:       Physical Exam General: Well appearing, NAD. Room air. Heart: RRR; no murmur Lungs: CTA anteriorly Abdomen: soft Extremities: L BKA, RLE without edema Dialysis Access: [facility]  Additional Objective Labs: Basic Metabolic Panel: Recent Labs  Lab 09/19/20 1152 09/21/20 0847 09/22/20 1931 09/23/20 0655 09/24/20 0805  NA 129*   < > 129* 133* 132*  K 5.2*   < > 4.0 3.8 4.0  CL 93*   < > 88* 93* 94*  CO2 15*   < > 23 24 25   GLUCOSE 291*   < > 288* 239* 83  BUN 63*   < > 59* 27* 34*  CREATININE 9.10*   < > 8.56* 5.29* 6.66*  CALCIUM 8.9   < > 7.8* 7.9* 7.9*  PHOS 6.5*  --  6.3*  --  4.9*   < > = values in this interval not displayed.   Liver Function Tests: Recent Labs  Lab 09/21/20 0847 09/22/20 1931 09/24/20 0805  AST 17 12*  --   ALT 22 19  --   ALKPHOS 187* 194*  --   BILITOT 1.7* 0.6  --   PROT 6.5 6.6  --   ALBUMIN 1.8* 1.8* 1.7*   CBC: Recent Labs  Lab 09/19/20 1152 09/21/20 0847 09/22/20 1931 09/23/20 0655 09/24/20 0805  WBC 38.9* 36.8* 20.6* 20.7* 18.0*  HGB 7.3* 7.4* 6.9* 7.0* 6.7*  HCT 23.3* 25.9* 23.1* 23.7* 23.0*  MCV 92.8 96.6 93.1 95.2 95.8  PLT 213 245 225 217 228   Blood Culture    Component Value Date/Time   SDES BLOOD RIGHT ANTECUBITAL 09/21/2020 0851   SDES BLOOD RIGHT ANTECUBITAL 09/21/2020 0851   SPECREQUEST  09/21/2020 0851    BOTTLES DRAWN AEROBIC AND ANAEROBIC Blood Culture adequate volume   SPECREQUEST   09/21/2020 0851    BOTTLES DRAWN AEROBIC AND ANAEROBIC Blood Culture results may not be optimal due to an inadequate volume of blood received in culture bottles   CULT  09/21/2020 0851    NO GROWTH 2 DAYS Performed at [facility], [place] [address], [place], [place] [address]    CULT  09/21/2020 [phone]    NO GROWTH 2 DAYS Performed at [facility], [place] [address] [place], [place] [address]    REPTSTATUS PENDING 09/21/2020 0851   REPTSTATUS PENDING 09/21/2020 0851   Medications:  sodium chloride     sodium chloride      ceFAZolin (ANCEF) IV      (feeding supplement) PROSource Plus  30 mL Oral TID BM   calcitRIOL  2.5 mcg Oral Q M,W,F-HD   Chlorhexidine Gluconate Cloth  6 each Topical Q0600   Chlorhexidine Gluconate Cloth  6 each Topical Q0600   cinacalcet  60 mg Oral Q breakfast   darbepoetin (ARANESP) injection - DIALYSIS  60 mcg Intravenous Q Fri-HD   heparin  5,000 Units Subcutaneous Q8H   heparin sodium (porcine)  1,000 Units Intravenous Q M,W,F-HD   insulin aspart  0-5 Units Subcutaneous QHS   insulin aspart  0-6 Units Subcutaneous TID WC   insulin glargine  10 Units Subcutaneous QHS   multivitamin  1 tablet Oral QHS   sevelamer carbonate  4,000 mg Oral TID WC    Dialysis Orders: MWF @ [facility] 4:15hr, 2K/2Ca, 400/800, EDW 67kg, TDC, heparin 5000 + 5000 mid-run bolus - Calcitriol 70mcg PO q HD - No ESA ** Scheduled for R BC AVF on 09/24/20, now delayed per patient request.  Assessment/Plan: 1. 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 On Cefazolin. Ongoing pain issues. VVS following, small area of wound dehissence 2. ESRD: Usual MWF schedule - refused HD yesterday d/t family visit - HD now. 3. HTN/volume: BP stable without edema at this time. 4. Anemia: Hgb 6.7. Transfuse 1U PRBCs and will ^ next Aranesp dose. 5. Secondary hyperparathyroidism: CorrCa/Phos good - continue binders/VDRA/sensipar. 6. Nutrition: Alb low - continue protein  supplements. 7. Bipolar disorder + underlying personality disorder: Severe aggression last week - improved over past few days. Psych consulted on 12/24 - did not meet criteria for inpatient psych. 8. L wrist abscess: S/p I&D 12/19 - will need 1 more week abx per notes. 9. T2DM: Chronically uncontrolled.  Veneta Penton, PA-C 09/24/2020, 8:44 AM  [employer]

## 2020-09-24 NOTE — Progress Notes (Signed)
OT Cancellation Note  Patient Details Name: FABRICE DYAL MRN: [id number] DOB: 1981-05-29   Cancelled Treatment:    Reason Eval/Treat Not Completed: Patient at procedure or test/ unavailable. Pt currently at HD. Will follow-up for OT session as time allows.  Layla Maw 09/24/2020, 8:34 AM

## 2020-09-24 NOTE — Progress Notes (Signed)
PT Cancellation Note  Patient Details Name: [name] MRN: [id number] DOB: March 10, 1981   Cancelled Treatment:    Reason Eval/Treat Not Completed: Medical issues which prohibited therapy Patient currently off unit in HD. In addition, patient's Hgb 6.7 and will be receiving transfusion today. PT will re-attempt following transfusion and improvement of Hgb.   Perrin Maltese, PT, DPT Acute Rehabilitation Services Pager [phone] Office [phone]    Alda Lea 09/24/2020, 10:40 AM

## 2020-09-24 NOTE — Progress Notes (Signed)
[facility]  PROGRESS NOTE  [name] IOE:[id number] DOB: 12-10-1980 DOA: 09/10/2020 PCP: Kerin Perna, NP   Brief HPI:   39 year old male with history of ESRD, diabetes mellitus type 2, hypertension, left foot diabetic  ulcer who presented with left hand pain as well as erythema, swelling and warmth with drainage of the left foot.  Patient underwent left BKA vascular surgery and incision and drainage of wrist abscess.   Subjective   Patient seen and examined.  He got dialysis this morning.  His hemoglobin was low 6.7 this AM, 1 unit PRBC ordered by nephrology. He denies having any major complaints at this time.  WBC count still high but trending down.  Afebrile.   Assessment/Plan:   1. 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  Vascular surgery was consulted patient was taken to the OR on 09/16/2020 underwent left BKA.  Intraoperative report showed extensive gangrenous muscle and purulent collection of the left tibia.  Intraoperative cultures growing Enterococcus, Proteus which are pansensitive.  Patient is currently on IV cefazolin.  Plan for 1 more week of antibiotics with hemodialysis. 2. Left wrist abscess-hand surgeon Dr. Maryan Rued was consulted, he was taken to the OR on 09/15/2020.  Underwent incision and drainage.  Cultures grew MSSA, he was placed on Ancef with hemodialysis.  He will need 1 more week of IV antibiotics with hemodialysis pending improvement.  Still has leukocytosis but WBC count trending down.  Follow-up with orthopedics and vascular surgery. 3. ESRD-patient is on hemodialysis Monday Wednesday Friday.  Nephrology is following.  Plan was to get AV graft on Tuesday, 09/24/2020 however patient has refused surgery and wanted to be rescheduled at a later date.  He got dialysis today. 4. Diabetes mellitus type 2-poorly controlled, continue sliding scale insulin with NovoLog.  Lantus decreased dose to 6 units per recommendations from  diabetes nurse.  Continue to monitor Accu-Cheks and adjust accordingly. 5. Anemia chronic kidney disease-hemoglobin this morning 6.7.  1 unit of PRBC with hemodialysis per nephrology.  Will order CBC for a.m.  Monitor closely.  If hemoglobin trending down again then we may need to consider further work-up, and hold heparin? 6. ?  Bipolar disorder-patient had episode of aggression, psychosis, threatening behavior towards self and staff.  Security had to be called in on 09/20/2020.  He was IVCD, psychiatry was consulted. He was very apologetic.  IVC was discontinued. 7. Disposition-remains a problem, patient does not have appropriate services at home, no home health RN or PT can be arranged considering patient's aggressive behavior.  Patient wants to go home however he will not be able to get all the services at home.  At this time the best option will be to send him to skilled nursing facility.  [name] is agreeable to send him to skilled nursing facility, however patient is refusing.  We will continue to have ongoing discussion with social work, case management and patient family.   COVID-19 Labs  No results for input(s): DDIMER, FERRITIN, LDH, CRP in the last 72 hours.  Lab Results  Component Value Date   SARSCOV2NAA NEGATIVE 09/11/2020   SARSCOV2NAA NEGATIVE 07/22/2020   SARSCOV2NAA POSITIVE (A) 02/14/2020   [place] NEGATIVE 01/31/2020     Scheduled medications:   . (feeding supplement) PROSource Plus  30 mL Oral TID BM  . sodium chloride   Intravenous Once  . calcitRIOL  2.5 mcg Oral Q M,W,F-HD  . Chlorhexidine Gluconate Cloth  6 each Topical Q0600  .  Chlorhexidine Gluconate Cloth  6 each Topical Q0600  . cinacalcet  60 mg Oral Q breakfast  . [START ON 09/27/2020] darbepoetin (ARANESP) injection - DIALYSIS  150 mcg Intravenous Q Fri-HD  . heparin  5,000 Units Subcutaneous Q8H  . heparin sodium (porcine)  1,000 Units Intravenous Q M,W,F-HD  . insulin aspart  0-6 Units  Subcutaneous TID WC  . insulin glargine  6 Units Subcutaneous QHS  . multivitamin  1 tablet Oral QHS  . sevelamer carbonate  4,000 mg Oral TID WC    CBG: Recent Labs  Lab 09/23/20 2119 09/24/20 0149 09/24/20 0207 09/24/20 1124 09/24/20 1603  GLUCAP 208* 35* 174* 61* 180*    SpO2: 100 % O2 Flow Rate (L/min): 2 L/min    CBC: Recent Labs  Lab 09/19/20 1152 09/21/20 0847 09/22/20 1931 09/23/20 0655 09/24/20 0805  WBC 38.9* 36.8* 20.6* 20.7* 18.0*  HGB 7.3* 7.4* 6.9* 7.0* 6.7*  HCT 23.3* 25.9* 23.1* 23.7* 23.0*  MCV 92.8 96.6 93.1 95.2 95.8  PLT 213 245 225 217 981    Basic Metabolic Panel: Recent Labs  Lab 09/19/20 1152 09/21/20 0847 09/22/20 1931 09/23/20 0655 09/24/20 0805  NA 129* 129* 129* 133* 132*  K 5.2* 4.4 4.0 3.8 4.0  CL 93* 90* 88* 93* 94*  CO2 15* 11* 23 24 25   GLUCOSE 291* 370* 288* 239* 83  BUN 63* 47* 59* 27* 34*  CREATININE 9.10* 7.34* 8.56* 5.29* 6.66*  CALCIUM 8.9 8.0* 7.8* 7.9* 7.9*  PHOS 6.5*  --  6.3*  --  4.9*     Liver Function Tests: Recent Labs  Lab 09/19/20 1152 09/21/20 0847 09/22/20 1931 09/24/20 0805  AST  --  17 12*  --   ALT  --  22 19  --   ALKPHOS  --  187* 194*  --   BILITOT  --  1.7* 0.6  --   PROT  --  6.5 6.6  --   ALBUMIN 1.8* 1.8* 1.8* 1.7*     Antibiotics: Anti-infectives (From admission, onward)   Start     Dose/Rate Route Frequency Ordered Stop   09/23/20 1200  ceFAZolin (ANCEF) IVPB 2g/100 mL premix       "Followed by" Linked Group Details   2 g 200 mL/hr over 30 Minutes Intravenous Every M-W-F (Hemodialysis) 09/22/20 1029     09/22/20 1115  ceFAZolin (ANCEF) IVPB 2g/100 mL premix       "Followed by" Linked Group Details   2 g 200 mL/hr over 30 Minutes Intravenous  Once 09/22/20 1029 09/22/20 2318   09/19/20 1254  Vancomycin (VANCOCIN) 750-5 MG/150ML-% IVPB       Note to Pharmacy: Judieth Keens  : cabinet override      09/19/20 1254 09/19/20 1317   09/19/20 1200  vancomycin (VANCOCIN)  IVPB 750 mg/150 ml premix        750 mg 150 mL/hr over 60 Minutes Intravenous Every T-Th-Sa (Hemodialysis) 09/19/20 1034 09/19/20 1414   09/18/20 0800  Ampicillin-Sulbactam (UNASYN) 3 g in sodium chloride 0.9 % 100 mL IVPB  Status:  Discontinued        3 g 200 mL/hr over 30 Minutes Intravenous Every 12 hours 09/17/20 1226 09/22/20 1029   09/13/20 0933  Vancomycin (VANCOCIN) 750-5 MG/150ML-% IVPB       Note to Pharmacy: Herriott, Melisa   : cabinet override      09/13/20 0933 09/13/20 2144   09/11/20 1200  vancomycin (VANCOCIN) IVPB 750 mg/150 ml premix  Status:  Discontinued        750 mg 150 mL/hr over 60 Minutes Intravenous Every M-W-F (Hemodialysis) 09/11/20 0238 09/20/20 1134   09/11/20 1130  ceFEPIme (MAXIPIME) 1 g in sodium chloride 0.9 % 100 mL IVPB        1 g 200 mL/hr over 30 Minutes Intravenous Every 24 hours 09/11/20 0919 09/17/20 0921   09/11/20 0300  piperacillin-tazobactam (ZOSYN) IVPB 2.25 g  Status:  Discontinued        2.25 g 100 mL/hr over 30 Minutes Intravenous Every 8 hours 09/11/20 0238 09/11/20 0858   09/11/20 0300  vancomycin (VANCOREADY) IVPB 1250 mg/250 mL        1,250 mg 166.7 mL/hr over 90 Minutes Intravenous  Once 09/11/20 0238 09/11/20 0613       DVT prophylaxis: SCDs  Code Status: Full code  Family Communication: No family at bedside   Consultants:  Vascular surgery, orthopedics, nephrology, psychiatry, podiatry  Procedures:  Left BKA 09/16/2020, left wrist incision and drainage 09/15/2020    Objective   Vitals:   09/24/20 1005 09/24/20 1035 09/24/20 1050 09/24/20 1134  BP: 112/88 (!) 141/86 131/89 114/79  Pulse:    79  Resp: 19 18 14 17   Temp:   98.8 F (37.1 C) 98.3 F (36.8 C)  TempSrc:   Oral Oral  SpO2:   98% 100%  Weight:   67.4 kg   Height:        Intake/Output Summary (Last 24 hours) at 09/24/2020 1627 Last data filed at 09/24/2020 1554 Gross per 24 hour  Intake 528.75 ml  Output 1117 ml  Net -588.25 ml    12/26  1901 - 12/28 0700 In: -  Out: 1500   Filed Weights   09/19/20 1420 09/24/20 0740 09/24/20 1050  Weight: 66.4 kg 68.6 kg 67.4 kg    Physical Examination:   General-chronically ill-appearing male, in no acute distress  Heart-S1-S2, regular  Lungs-clear to auscultation bilaterally, no wheezing or crackles auscultated  Abdomen-soft, nontender, no organomegaly  Extremities-s/p left BKA  Neuro-alert, oriented x3, no focal deficit noted   Status is: Inpatient  Dispo: The patient is from: Home              Anticipated d/c is to: Home versus skilled nursing facility              Anticipated d/c date is: 09/25/2020              Patient currently not stable for discharge  Barrier to discharge-unsafe discharge    Data Reviewed:   Recent Results (from the past 240 hour(s))  Fungus Culture With Stain     Status: None (Preliminary result)   Collection Time: 09/15/20 12:37 AM   Specimen: Abscess  Result Value Ref Range Status   Fungus Stain Final report  Final    Comment: (NOTE) Performed At: [facility] [place], [place] [id number] Rush Farmer MD [id number]    Fungus (Mycology) Culture PENDING  Incomplete   Fungal Source LEFT WRIST  Final    Comment: Performed at [facility], [place] [address] [place], [place] [address]  Fungus Culture Result     Status: None   Collection Time: 09/15/20 12:37 AM  Result Value Ref Range Status   Result 1 Comment  Final    Comment: (NOTE) KOH/Calcofluor preparation:  no fungus observed. Performed At: [facility] [place], [place] [id number] Rush Farmer MD HA:[id number]   Aerobic/Anaerobic  Culture (surgical/deep wound)     Status: None   Collection Time: 09/15/20  1:26 AM   Specimen: Wound; Abscess  Result Value Ref Range Status   Specimen Description WOUND LEFT WRIST  Final   Special Requests PT ON VANCOMYCIN  Final   Gram Stain NO WBC SEEN NO ORGANISMS SEEN   Final    Culture   Final    MODERATE STAPHYLOCOCCUS AUREUS ABUNDANT DIPHTHEROIDS(CORYNEBACTERIUM SPECIES) Standardized susceptibility testing for this organism is not available. NO ANAEROBES ISOLATED Performed at [facility], [place] [address], [place], [place] [address]    Report Status 09/20/2020 FINAL  Final   Organism ID, Bacteria STAPHYLOCOCCUS AUREUS  Final      Susceptibility   Staphylococcus aureus - MIC*    CIPROFLOXACIN <=0.5 SENSITIVE Sensitive     ERYTHROMYCIN >=8 RESISTANT Resistant     GENTAMICIN <=0.5 SENSITIVE Sensitive     OXACILLIN <=0.25 SENSITIVE Sensitive     TETRACYCLINE >=16 RESISTANT Resistant     VANCOMYCIN <=0.5 SENSITIVE Sensitive     TRIMETH/SULFA <=10 SENSITIVE Sensitive     CLINDAMYCIN >=8 RESISTANT Resistant     RIFAMPIN <=0.5 SENSITIVE Sensitive     Inducible Clindamycin NEGATIVE Sensitive     * MODERATE STAPHYLOCOCCUS AUREUS  MRSA PCR Screening     Status: None   Collection Time: 09/16/20  5:08 AM   Specimen: Nasal Mucosa; Nasopharyngeal  Result Value Ref Range Status   MRSA by PCR NEGATIVE NEGATIVE Final    Comment:        The GeneXpert MRSA Assay (FDA approved for NASAL specimens only), is one component of a comprehensive MRSA colonization surveillance program. It is not intended to diagnose MRSA infection nor to guide or monitor treatment for MRSA infections. Performed at [facility], [place] [address] [place], [place] [address]   Anaerobic culture     Status: None   Collection Time: 09/16/20  3:49 PM   Specimen: Leg, Left; Wound  Result Value Ref Range Status   Specimen Description WOUND LEFT LEG  Final   Special Requests PT ON CEFEPIME LEFT LEG ANTERIOR COMPARTMENT  Final   Culture   Final    FEW BACTEROIDES THETAIOTAOMICRON BETA LACTAMASE POSITIVE Performed at [facility], [address] [place] [address]    Report Status 09/19/2020 FINAL  Final  Aerobic Culture (superficial specimen)     Status: None    Collection Time: 09/16/20  3:49 PM   Specimen: Leg, Left; Wound  Result Value Ref Range Status   Specimen Description WOUND LEFT LEG  Final   Special Requests PT ON CEFEPIME LEFT LEG ANTERIOR COMPARTMENT  Final   Gram Stain   Final    NO WBC SEEN NO ORGANISMS SEEN Performed at [facility], [name] [place], [place] [address]    Culture FEW ENTEROCOCCUS FAECALIS FEW PROTEUS MIRABILIS   Final   Report Status 09/20/2020 FINAL  Final   Organism ID, Bacteria ENTEROCOCCUS FAECALIS  Final   Organism ID, Bacteria PROTEUS MIRABILIS  Final      Susceptibility   Enterococcus faecalis - MIC*    AMPICILLIN <=2 SENSITIVE Sensitive     VANCOMYCIN 1 SENSITIVE Sensitive     GENTAMICIN SYNERGY SENSITIVE Sensitive     * FEW ENTEROCOCCUS FAECALIS   Proteus mirabilis - MIC*    AMPICILLIN <=2 SENSITIVE Sensitive     CEFAZOLIN <=4 SENSITIVE Sensitive     CEFEPIME <=0.12 SENSITIVE Sensitive  CEFTAZIDIME <=1 SENSITIVE Sensitive     CEFTRIAXONE <=0.25 SENSITIVE Sensitive     CIPROFLOXACIN <=0.25 SENSITIVE Sensitive     GENTAMICIN <=1 SENSITIVE Sensitive     IMIPENEM 2 SENSITIVE Sensitive     TRIMETH/SULFA <=20 SENSITIVE Sensitive     AMPICILLIN/SULBACTAM <=2 SENSITIVE Sensitive     PIP/TAZO <=4 SENSITIVE Sensitive     * FEW PROTEUS MIRABILIS  Culture, blood (Routine X 2) w Reflex to ID Panel     Status: None (Preliminary result)   Collection Time: 09/21/20  8:51 AM   Specimen: BLOOD  Result Value Ref Range Status   Specimen Description BLOOD RIGHT ANTECUBITAL  Final   Special Requests   Final    BOTTLES DRAWN AEROBIC AND ANAEROBIC Blood Culture adequate volume   Culture   Final    NO GROWTH 3 DAYS Performed at [facility], [address] [place], [place] [address]    Report Status PENDING  Incomplete  Culture, blood (Routine X 2) w Reflex to ID Panel     Status: None (Preliminary result)   Collection Time: 09/21/20  8:51 AM   Specimen: BLOOD  Result Value Ref Range  Status   Specimen Description BLOOD RIGHT ANTECUBITAL  Final   Special Requests   Final    BOTTLES DRAWN AEROBIC AND ANAEROBIC Blood Culture results may not be optimal due to an inadequate volume of blood received in culture bottles   Culture   Final    NO GROWTH 3 DAYS Performed at [facility], [place] [address] [place], [place] [address]    Report Status PENDING  Incomplete      Yaakov Guthrie, MD  [facility] If 7PM-7AM, please contact night-coverage at [email or web address], Office  [phone]   09/24/2020, 4:27 PM  LOS: 13 days

## 2020-09-24 NOTE — Plan of Care (Deleted)
Plan for OR today for emergency Incision and Drainage. PT states he has not had anything to eat or drink today. Will follow post-operatively.  Evelina Bucy, DPM

## 2020-09-24 NOTE — Progress Notes (Addendum)
Critical hgb 6.7 reported to Veneta Penton, [place]. Orders to transfuse 1 unit prbc if pt. Agrees. Pt. Made aware pt. Request to let him think about receiving PRBC but did agree to be typed and screen. Pt request that this nurse explain to childrens mom name "Vallarie Mare" whom he was speaking with on phone. Vallarie Mare states understanding and comforts pt. In his decision. This nurse will send type and screen for pt. And await pt. Decision to receive blood products. Veneta Penton PA aware   ADDENDUM (8;52AM): Discussed blood transfusion - pt now agreeable, will order 1U.   Veneta Penton, PA-C [employer] Pager [phone]

## 2020-09-25 ENCOUNTER — Encounter (HOSPITAL_COMMUNITY): Payer: Self-pay | Admitting: Family Medicine

## 2020-09-25 DIAGNOSIS — F4320 Adjustment disorder, unspecified: Secondary | ICD-10-CM | POA: Diagnosis not present

## 2020-09-25 LAB — TYPE AND SCREEN
ABO/RH(D): B POS
Antibody Screen: NEGATIVE
Unit division: 0

## 2020-09-25 LAB — CBC
HCT: 27.1 % — ABNORMAL LOW (ref 39.0–52.0)
Hemoglobin: 8.4 g/dL — ABNORMAL LOW (ref 13.0–17.0)
MCH: 29.1 pg (ref 26.0–34.0)
MCHC: 31 g/dL (ref 30.0–36.0)
MCV: 93.8 fL (ref 80.0–100.0)
Platelets: 230 10*3/uL (ref 150–400)
RBC: 2.89 MIL/uL — ABNORMAL LOW (ref 4.22–5.81)
RDW: 17.2 % — ABNORMAL HIGH (ref 11.5–15.5)
WBC: 14.8 10*3/uL — ABNORMAL HIGH (ref 4.0–10.5)
nRBC: 0 % (ref 0.0–0.2)

## 2020-09-25 LAB — BPAM RBC
Blood Product Expiration Date: 202201042359
ISSUE DATE / TIME: 202112281000
Unit Type and Rh: 7300

## 2020-09-25 LAB — HEMOGLOBIN A1C
Hgb A1c MFr Bld: 7.3 % — ABNORMAL HIGH (ref 4.8–5.6)
Mean Plasma Glucose: 162.81 mg/dL

## 2020-09-25 LAB — GLUCOSE, CAPILLARY
Glucose-Capillary: 119 mg/dL — ABNORMAL HIGH (ref 70–99)
Glucose-Capillary: 151 mg/dL — ABNORMAL HIGH (ref 70–99)
Glucose-Capillary: 154 mg/dL — ABNORMAL HIGH (ref 70–99)
Glucose-Capillary: 213 mg/dL — ABNORMAL HIGH (ref 70–99)
Glucose-Capillary: 280 mg/dL — ABNORMAL HIGH (ref 70–99)

## 2020-09-25 MED ORDER — CEFAZOLIN SODIUM-DEXTROSE 1-4 GM/50ML-% IV SOLN
1.0000 g | INTRAVENOUS | Status: DC
  Administered 2020-09-25 – 2020-09-28 (×4): 1 g via INTRAVENOUS
  Filled 2020-09-25 (×5): qty 50

## 2020-09-25 NOTE — Progress Notes (Signed)
Hypoglycemic Event  CBG: 38  Treatment: 50% Dextrose and sandwich  Symptoms: sweaty   Follow-up CBG: Time:0015 CBG Result:119  Possible Reasons for Event: Poor po intake  Comments/MD notified Patient requested that only 3 units of Lantus be given due his experience with Hypoglycemic episode 12/27. Patient was given a [place] sandwich tray that he requested but he did not eat it as I instructed him to do after I gave him the Lantus.    Jule Economy

## 2020-09-25 NOTE — Progress Notes (Signed)
Occupational Therapy Treatment Patient Details Name: [name] MRN: [id number] DOB: [birth year] Today's Date: 09/25/2020    History of present illness Patient is a 39 y/o male with PMH significant for DM Type 1, gastroparesis, HTN, ESRD, chronic L foot diabetic wound infection. Patient admitted for sepsis 2/2 acute osteomyelitis involving L ankle and foot. 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   OT comments  Pt pleasant on OT entry, initially agreeable for OOB activities then changed his mind due to need to rest before his mother visits. Pt more open to education for safety during ADLs. Able to assist in problem solving and verbalize understanding of LB dressing while seated. Pt reports donning scrub pants in bed without assistance earlier today. Reinforced fall prevention education, impact of amputation on balance, and safety strategies to implement. Continue to recommend postacute rehab due to high fall risk and decreased support at discharge. Plan to assess ADL mobility and transfers during next session as appropriate.    Follow Up Recommendations  Supervision/Assistance - 24 hour;SNF    Equipment Recommendations  3 in 1 bedside commode;Other (comment);Tub/shower bench;Wheelchair (measurements OT);Wheelchair cushion (measurements OT) (RW)    Recommendations for Other Services Other (comment) (psych consult)    Precautions / Restrictions Precautions Precautions: Fall Precaution Comments: Fell evening on 12/23 and morning of 12/24 Restrictions Weight Bearing Restrictions: Yes LLE Weight Bearing: Non weight bearing       Mobility Bed Mobility Overal bed mobility: Modified Independent             General bed mobility comments: long sitting and reaching across bed to tray table, lifting trunk to pull items off of lunch tray provided by staff member  Transfers                 General transfer comment: Pt declined OOB transfers    Balance Overall  balance assessment: No apparent balance deficits (not formally assessed) Sitting-balance support: No upper extremity supported Sitting balance-[name]: Fair                                     ADL either performed or assessed with clinical judgement   ADL Overall ADL's : Needs assistance/impaired                       Lower Body Dressing Details (indicate cue type and reason): pt wearing scrub pants, offered new pair of scrub pants and to assess compensatory strategies with pt initially agreeing but then declined because he wanted to rest. Educated on compensatory strategies, safety in completing LB ADLs seated or bed level to decrease fall risk. Pt reports he donned scrub pants Independently in bed               General ADL Comments: Pt with improved mood though self limiting with behaviors. Requesting pain meds after therapy session to allow for ability to rest though did not want to get OOB and did not appear to be in increased pain     Vision   Vision Assessment?: No apparent visual deficits   Perception     Praxis      Cognition Arousal/Alertness: Awake/alert Behavior During Therapy: WFL for tasks assessed/performed Overall Cognitive Status: Impaired/Different from baseline Area of Impairment: Attention;Memory;Safety/judgement  Current Attention Level: Sustained Memory: Decreased short-term memory   Safety/Judgement: Decreased awareness of safety;Decreased awareness of deficits     General Comments: Improved mood from previous sessions though tangential with conversation. Decreased understanding of therapy needs, fall risk        Exercises Exercises: Amputee Amputee Exercises Quad Sets: Left;10 reps Hip ABduction/ADduction: Left;10 reps Straight Leg Raises: Left;10 reps   Shoulder Instructions       General Comments Educated and reinforced residual limb exercises, elevation and fall prevention strategies     Pertinent Vitals/ Pain       Pain Assessment: Faces Faces Pain Scale: Hurts a little bit Pain Location: L LE Pain Descriptors / Indicators: Guarding;Grimacing Pain Intervention(s): Patient requesting pain meds-RN notified;Repositioned  Home Living                                          Prior Functioning/Environment              Frequency  Min 2X/week        Progress Toward Goals  OT Goals(current goals can now be found in the care plan section)  Progress towards OT goals: OT to reassess next treatment  Acute Rehab OT Goals Patient Stated Goal: to go home OT Goal Formulation: With patient Time For Goal Achievement: 10/02/20 Potential to Achieve Goals: Good ADL Goals Pt Will Perform Lower Body Dressing: with modified independence;sitting/lateral leans;sit to/from stand Pt Will Transfer to Toilet: with modified independence;ambulating;regular height toilet Pt Will Perform Toileting - Clothing Manipulation and hygiene: with modified independence;sitting/lateral leans;sit to/from stand Additional ADL Goal #1: Pt to verbalize at least 2 fall prevention strategies to maximize safety at home  Plan Discharge plan remains appropriate    Co-evaluation                 AM-PAC OT "6 Clicks" Daily Activity     Outcome Measure   Help from another person eating meals?: None Help from another person taking care of personal grooming?: None Help from another person toileting, which includes using toliet, bedpan, or urinal?: A Lot Help from another person bathing (including washing, rinsing, drying)?: A Little Help from another person to put on and taking off regular upper body clothing?: A Little Help from another person to put on and taking off regular lower body clothing?: A Lot 6 Click Score: 18    End of Session    OT Visit Diagnosis: Unsteadiness on feet (R26.81);Other abnormalities of gait and mobility (R26.89)   Activity Tolerance Other  (comment) (self limiting)   Patient Left in bed;with call bell/phone within reach   Nurse Communication Mobility status;Patient requests pain meds        Time: 3244-0102 OT Time Calculation (min): 22 min  Charges: OT General Charges $OT Visit: 1 Visit OT Treatments $Self Care/Home Management : 8-22 mins  Layla Maw, OTR/L   Layla Maw 09/25/2020, 2:23 PM

## 2020-09-25 NOTE — NC FL2 (Signed)
[place] LEVEL OF CARE SCREENING TOOL     IDENTIFICATION  Patient Name: [name] Birthdate: 1981-04-24 Sex: male Admission Date (Current Location): 09/10/2020  [place] and [place] NumberKathleen Argue Medicaid #[id number] [place] and Address:  The [place], [place] [address] [place], [place] [address]      Provider Number: [id number]  Attending Physician Name and Address:  Cherene Altes, MD  Relative Name and Phone Number:  Pancho Rushing, mother - [phone]    Current Level of Care: Hospital Recommended Level of Care: [place] Prior Approval Number:    Date Approved/Denied:   PASRR Number: [id number]  Discharge Plan: SNF    Current Diagnoses: Patient Active Problem List   Diagnosis Date Noted  . Adjustment disorder, unspecified 09/21/2020  . Malnutrition of moderate degree 09/13/2020  . Osteomyelitis of left foot ([place]) 09/11/2020  . Volume overload 07/22/2020  . Hypocalcemia 06/07/2020  . Diabetic ulcer of heel ([place]) 05/31/2020  . Type 2 diabetes mellitus with foot ulcer ([place]) 04/23/2020  . COVID-19 02/20/2020  . Personal history of COVID-19 02/20/2020  . Diabetic ulcer of left midfoot associated with type 1 diabetes mellitus, with fat layer exposed ([place])   . Charcot's joint of foot, left   . Pneumonia due to COVID-19 virus 02/14/2020  . Cellulitis of left foot 01/31/2020  . Allergy, unspecified, initial encounter 07/17/2019  . Diarrhea 04/24/2019  . Bone erosion determined by x-ray   . Encephalopathy acute   . Diabetic ulcer of left midfoot associated with diabetes mellitus due to underlying condition, with necrosis of bone ([place])   . Open wound of left foot   . Encounter for orthopedic aftercare following surgical amputation   . Foot infection   . Gangrene of toe of left foot ([place])   . Cellulitis 10/26/2018  . Diabetic ulcer of left midfoot associated with diabetes mellitus due to underlying  condition, with bone involvement without evidence of necrosis ([place])   . Nausea vomiting and diarrhea 10/20/2018  . DM (diabetes mellitus), secondary, uncontrolled, with neurologic complications ([place])   . Headache 09/23/2018  . Acute osteomyelitis involving ankle and foot, left ([place])   . Anemia of chronic disease 08/18/2018  . Encounter for immunization 06/29/2018  . Hyperkalemia   . Unspecified protein-calorie malnutrition ([place]) 02/14/2018  . Encounter for removal of sutures 02/07/2018  . Fluid overload, unspecified 01/22/2018  . Osteomyelitis of ankle or foot, acute, right ([place]) 10/30/2017  . Cellulitis and abscess of toe of left foot   . Diabetic foot infection ([place])   . Insulin dependent type 1 diabetes mellitus ([place]) 10/25/2017  . Diabetic gastroparesis ([place]) 10/25/2017  . Sepsis ([place]) 10/25/2017  . Fever, unspecified 10/28/2016  . Iron deficiency anemia, unspecified 10/28/2016  . Other specified coagulation defects ([place]) 10/28/2016  . Pruritus, unspecified 10/28/2016  . Secondary hyperparathyroidism of renal origin ([place]) 10/28/2016  . Shortness of breath 10/28/2016  . HTN (hypertension) 08/09/2014  . ESRD on dialysis ([place]) 03/19/2014  . Metabolic acidosis 62/69/4854    Orientation RESPIRATION BLADDER Height & Weight     Self,Time,Situation,Place  Normal  (HD on MWF at [facility]. HD center at 5:40 am - Will need transport provided) Weight: 67.4 kg Height:  5\' 7"  (170.2 cm)  BEHAVIORAL SYMPTOMS/MOOD NEUROLOGICAL BOWEL NUTRITION STATUS      Continent (HD patient on MWF) Diet (See discharge summary)  AMBULATORY STATUS COMMUNICATION OF NEEDS Skin   Limited Assist Verbally Surgical wounds,Hydro Therapy (surgical wounds  to left wrist and Left BKA; IV antibiotics with HD for next week- Cefazolin)                       Personal Care Assistance Level of Assistance  Bathing,Dressing Bathing Assistance: Independent   Dressing Assistance: Limited assistance     Functional  Limitations Info  Sight,Hearing,Speech Sight Info: Adequate Hearing Info: Adequate Speech Info: Adequate    SPECIAL CARE FACTORS FREQUENCY  PT (By licensed PT),OT (By licensed OT)     PT Frequency: 5 x per week OT Frequency: 5 x per week            Contractures Contractures Info: Not present    Additional Factors Info  Code Status,Allergies,Psychotropic,Insulin Sliding Scale,Isolation Precautions Code Status Info: Full code Allergies Info: NKDA Psychotropic Info: Haldol Insulin Sliding Scale Info: See discharge summary Isolation Precautions Info: declined vaccination for COVID - Hx COVID infection in the past, COVID negative     Current Medications (09/25/2020):  This is the current hospital active medication list Current Facility-Administered Medications  Medication Dose Route Frequency Provider Last Rate Last Admin  . (feeding supplement) PROSource Plus liquid 30 mL  30 mL Oral TID BM Dagoberto Ligas, PA-C   30 mL at 09/25/20 1355  . 0.9 %  sodium chloride infusion (Manually program via Guardrails IV Fluids)   Intravenous Once Stovall, Kathryn R, PA-C      . albuterol (VENTOLIN HFA) 108 (90 Base) MCG/ACT inhaler 2 puff  2 puff Inhalation Q6H PRN Dagoberto Ligas, PA-C      . calcitRIOL (ROCALTROL) capsule 2.5 mcg  2.5 mcg Oral Q M,W,F-HD Dagoberto Ligas, PA-C   2.5 mcg at 09/23/20 1845  . ceFAZolin (ANCEF) IVPB 1 g/50 mL premix  1 g Intravenous Q24H von Dohlen, Haley B, RPH      . Chlorhexidine Gluconate Cloth 2 % PADS 6 each  6 each Topical Q0600 Dagoberto Ligas, PA-C   6 each at 09/25/20 3149  . Chlorhexidine Gluconate Cloth 2 % PADS 6 each  6 each Topical Q0600 Elmarie Shiley, MD      . cinacalcet [facility]) tablet 60 mg  60 mg Oral Q breakfast Dagoberto Ligas, PA-C   60 mg at 09/25/20 0904  . [START ON 09/27/2020] Darbepoetin Alfa (ARANESP) injection 150 mcg  150 mcg Intravenous Q Fri-HD Stovall, Kathryn R, PA-C      . haloperidol (HALDOL) tablet 5 mg  5 mg Oral Q6H PRN  Caren Griffins, MD       Or  . haloperidol lactate (HALDOL) injection 5 mg  5 mg Intramuscular Q6H PRN Caren Griffins, MD   5 mg at 09/20/20 1535  . heparin injection 5,000 Units  5,000 Units Subcutaneous Q8H Dagoberto Ligas, PA-C   5,000 Units at 09/25/20 1355  . heparin sodium (porcine) injection 1,000 Units  1,000 Units Intravenous Q M,W,F-HD Dagoberto Ligas, PA-C   3,800 Units at 09/16/20 1045  . HYDROmorphone (DILAUDID) injection 1 mg  1 mg Intravenous Q2H PRN Dagoberto Ligas, PA-C   1 mg at 09/25/20 1355  . insulin aspart (novoLOG) injection 0-6 Units  0-6 Units Subcutaneous TID WC Dagoberto Ligas, PA-C   1 Units at 09/25/20 1222  . insulin glargine (LANTUS) injection 6 Units  6 Units Subcutaneous QHS Yaakov Guthrie, MD   3 Units at 09/24/20 2158  . multivitamin (RENA-VIT) tablet 1 tablet  1 tablet Oral QHS Caren Griffins, MD   1 tablet at 09/24/20 2147  .  oxyCODONE (Oxy IR/ROXICODONE) immediate release tablet 5 mg  5 mg Oral Q4H PRN Dagoberto Ligas, PA-C   5 mg at 09/25/20 1204  . sevelamer carbonate (RENVELA) tablet 2,400 mg  2,400 mg Oral PRN Dagoberto Ligas, PA-C   2,400 mg at 09/11/20 0740  . sevelamer carbonate (RENVELA) tablet 4,000 mg  4,000 mg Oral TID WC Dagoberto Ligas, PA-C   4,000 mg at 09/25/20 1222     Discharge Medications: Please see discharge summary for a list of discharge medications.  Relevant Imaging Results:  Relevant Lab Results:   Additional Information SS# [id number]  Curlene Labrum, RN

## 2020-09-25 NOTE — Progress Notes (Signed)
[facility] Progress Note   Subjective:  Seen in room - s/p HD yesterday which was make-up from Monday. Discussed that today is his usual HD day - he declines today, says "I have too much going on." Wound RN in room getting him ready for hydrotherapy to L wrist. He agrees for HD tomorrow. Denies CP or dyspnea.  Objective Vitals:   09/24/20 1050 09/24/20 1134 09/24/20 1900 09/25/20 0819  BP: 131/89 114/79 131/87 (!) 143/97  Pulse:  79 96 84  Resp: 14 17 16 16   Temp: 98.8 F (37.1 C) 98.3 F (36.8 C) 98.9 F (37.2 C) 98 F (36.7 C)  TempSrc: Oral Oral Oral Oral  SpO2: 98% 100% 96% 100%  Weight: 67.4 kg     Height:       Physical Exam General:Well appearing, NAD. Room air. Heart:RRR; no murmur Lungs:CTA anteriorly Abdomen:soft Extremities:L BKA, RLE without edema. L wrist bandaged Dialysis Access: [facility]  Additional Objective Labs: Basic Metabolic Panel: Recent Labs  Lab 09/19/20 1152 09/21/20 0847 09/22/20 1931 09/23/20 0655 09/24/20 0805  NA 129*   < > 129* 133* 132*  K 5.2*   < > 4.0 3.8 4.0  CL 93*   < > 88* 93* 94*  CO2 15*   < > 23 24 25   GLUCOSE 291*   < > 288* 239* 83  BUN 63*   < > 59* 27* 34*  CREATININE 9.10*   < > 8.56* 5.29* 6.66*  CALCIUM 8.9   < > 7.8* 7.9* 7.9*  PHOS 6.5*  --  6.3*  --  4.9*   < > = values in this interval not displayed.   Liver Function Tests: Recent Labs  Lab 09/21/20 0847 09/22/20 1931 09/24/20 0805  AST 17 12*  --   ALT 22 19  --   ALKPHOS 187* 194*  --   BILITOT 1.7* 0.6  --   PROT 6.5 6.6  --   ALBUMIN 1.8* 1.8* 1.7*   CBC: Recent Labs  Lab 09/21/20 0847 09/22/20 1931 09/23/20 0655 09/24/20 0805 09/25/20 0144  WBC 36.8* 20.6* 20.7* 18.0* 14.8*  HGB 7.4* 6.9* 7.0* 6.7* 8.4*  HCT 25.9* 23.1* 23.7* 23.0* 27.1*  MCV 96.6 93.1 95.2 95.8 93.8  PLT 245 225 217 228 230   Medications: .  ceFAZolin (ANCEF) IV Stopped (09/24/20 1631)   . (feeding supplement) PROSource Plus  30 mL Oral TID BM   . sodium chloride   Intravenous Once  . calcitRIOL  2.5 mcg Oral Q M,W,F-HD  . Chlorhexidine Gluconate Cloth  6 each Topical Q0600  . Chlorhexidine Gluconate Cloth  6 each Topical Q0600  . cinacalcet  60 mg Oral Q breakfast  . [START ON 09/27/2020] darbepoetin (ARANESP) injection - DIALYSIS  150 mcg Intravenous Q Fri-HD  . heparin  5,000 Units Subcutaneous Q8H  . heparin sodium (porcine)  1,000 Units Intravenous Q M,W,F-HD  . insulin aspart  0-6 Units Subcutaneous TID WC  . insulin glargine  6 Units Subcutaneous QHS  . multivitamin  1 tablet Oral QHS  . sevelamer carbonate  4,000 mg Oral TID WC    Dialysis Orders: MWF @ [facility] 4:15hr, 2K/2Ca, 400/800, EDW 67kg, TDC, heparin 5000 + 5000 mid-run bolus - Calcitriol 39mcg PO q HD - No ESA ** Scheduled for R BC AVF on 09/24/20, now delayed per patient request.  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 On Cefazolin. Ongoing pain issues. VVS following, small area of wound dehissence 2.  ESRD:Usual MWF schedule - refused Monday - dialyzed Tues, now refusing Wed, but agreeable for Thursday - HD tomorrow. 3.HTN/volume:BP stable without edema at this time. 4. Anemia:S/p 1U PRBCs on 12/27. Hgb improved to 8.4. Aranesp dose increased. 5. Secondary hyperparathyroidism:CorrCa/Phos good - continue binders/VDRA/sensipar. 6. Nutrition:Alb low - continue protein supplements. 7. Bipolar disorder + underlying personality disorder:Severe aggression last week - improved over past few days. Psych consulted on 12/24 - did not meet criteria for inpatient psych. 8.L wrist abscess: S/p I&D 12/19 - will need 1 more week abx per notes. 9. T2DM: Chronically uncontrolled.  Veneta Penton, PA-C 09/25/2020, 9:54 AM  [employer]

## 2020-09-25 NOTE — Progress Notes (Addendum)
Physical Therapy Treatment Patient Details Name: [name] MRN: [id number] DOB: 02/27/1981 Today's Date: 09/25/2020    History of Present Illness Patient is a 39 y/o male with PMH significant for DM Type 1, gastroparesis, HTN, ESRD, chronic L foot diabetic wound infection. Patient admitted for sepsis 2/2 acute osteomyelitis involving L ankle and foot. 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    PT Comments    Patient pleasant on arrival, but agitated near end of session due to fear of unknown and current situation. Lengthy discussion and education about need for short term rehab to maximize independence. Patient required max encouragement and constant reiteration on the benefits of rehab. Notified TOC about patient interest in rehab. Education on positioning of L LE to encourage knee extension. Performed functional exercises bed level focusing on L LE strengthening. Continue to recommend SNF for ongoing Physical Therapy.       Follow Up Recommendations  SNF;Supervision/Assistance - 24 hour     Equipment Recommendations  Rolling [name] with 5" wheels;3in1 (PT);Wheelchair (measurements PT);Wheelchair cushion (measurements PT)    Recommendations for Other Services       Precautions / Restrictions Precautions Precautions: Fall Precaution Comments: Fell evening on 12/23 and morning of 12/24 Restrictions Weight Bearing Restrictions: Yes LLE Weight Bearing: Non weight bearing    Mobility  Bed Mobility Overal bed mobility: Modified Independent                Transfers                    Ambulation/Gait                 Stairs             Wheelchair Mobility    Modified Rankin (Stroke Patients Only)       Balance Overall balance assessment: No apparent balance deficits (not formally assessed) Sitting-balance support: No upper extremity supported Sitting balance-[name]: Fair                                       Cognition Arousal/Alertness: Awake/alert Behavior During Therapy: WFL for tasks assessed/performed Overall Cognitive Status: Impaired/Different from baseline Area of Impairment: Attention;Memory;Safety/judgement                   Current Attention Level: Sustained Memory: Decreased short-term memory   Safety/Judgement: Decreased awareness of safety;Decreased awareness of deficits            Exercises Amputee Exercises Quad Sets: Left;10 reps Hip ABduction/ADduction: Left;10 reps Straight Leg Raises: Left;10 reps    General Comments        Pertinent Vitals/Pain Pain Assessment: Faces Faces Pain Scale: No hurt    Home Living                      Prior Function            PT Goals (current goals can now be found in the care plan section) Acute Rehab PT Goals Patient Stated Goal: to go home PT Goal Formulation: With patient Time For Goal Achievement: 09/27/20 Potential to Achieve Goals: Good Progress towards PT goals: Progressing toward goals    Frequency    Min 3X/week      PT Plan Current plan remains appropriate    Co-evaluation  AM-PAC PT "6 Clicks" Mobility   Outcome Measure  Help needed turning from your back to your side while in a flat bed without using bedrails?: None Help needed moving from lying on your back to sitting on the side of a flat bed without using bedrails?: None Help needed moving to and from a bed to a chair (including a wheelchair)?: A Little Help needed standing up from a chair using your arms (e.g., wheelchair or bedside chair)?: A Little Help needed to walk in hospital room?: A Little Help needed climbing 3-5 steps with a railing? : A Lot 6 Click Score: 19    End of Session   Activity Tolerance: Patient tolerated treatment well Patient left: in bed;with call bell/phone within reach;with nursing/sitter in room Nurse Communication: Mobility status PT Visit Diagnosis: Unsteadiness  on feet (R26.81);Other abnormalities of gait and mobility (R26.89);Muscle weakness (generalized) (M62.81);Difficulty in walking, not elsewhere classified (R26.2)     Time: 8372-9021 PT Time Calculation (min) (ACUTE ONLY): 45 min  Charges:  $Therapeutic Activity: 38-52 mins                     [name], PT, DPT Acute Rehabilitation Services Pager [phone] Office [phone]    [name] 09/25/2020, 12:29 PM

## 2020-09-25 NOTE — Progress Notes (Signed)
PHARMACY NOTE:  ANTIMICROBIAL RENAL DOSAGE ADJUSTMENT  Current antimicrobial regimen includes a mismatch between antimicrobial dosage and estimated renal function.  As per policy approved by the Pharmacy & Therapeutics and Medical Executive Committees, the antimicrobial dosage will be adjusted accordingly.  Current antimicrobial dosage:  Cefazolin 2g IV qHD MWF  Indication: osteomyelitis of L foot, L wrist abscess  Renal Function:  Estimated Creatinine Clearance: 13.6 mL/min (A) (by C-G formula based on SCr of 6.66 mg/dL (H)). [x]      On intermittent HD, scheduled: usually MWF but patient intermittently refusing and HD schedule changing inpatient    Antimicrobial dosage has been changed to:  Cefazolin 1g IV q24h while HD schedule uncertain   Arturo Morton, PharmD, BCPS Please check AMION for all [place] contact numbers Clinical Pharmacist 09/25/2020 11:10 AM

## 2020-09-25 NOTE — Progress Notes (Signed)
Physical Therapy Wound Treatment Patient Details  Name: [name] MRN: [id number] Date of Birth: 1981/08/11  Today's Date: 09/25/2020 Time: 0915-0940 Time Calculation (min): 25 min  Subjective  Subjective: Pleasant and agreeable to hydrotherapy Patient and Family Stated Goals: Heal the wound Date of Onset: 09/15/20 Prior Treatments: surgical I&D 12/19  Pain Score:   Premedicated with IV pain meds. Tolerated well without complaints of pain. Wound Assessment  Wound / Incision (Open or Dehisced) 09/21/20 Incision - Open Arm Left;Lower;Medial;Anterior open incision boarded by sutures, with exposed tendon (Active)  Dressing Type Compression wrap;Gauze (Comment);Moist to dry 09/25/20 1100  Dressing Changed Changed 09/25/20 1100  Dressing Status Clean;Dry;Intact 09/25/20 1100  Dressing Change Frequency Daily 09/25/20 1100  Site / Wound Assessment Yellow;Pink 09/25/20 1100  % Wound base Red or Granulating 70% 09/25/20 1100  % Wound base Yellow/Fibrinous Exudate 10% 09/25/20 1100  % Wound base Black/Eschar 0% 09/25/20 1100  % Wound base Other/Granulation Tissue (Comment) 20% 09/25/20 1100  Peri-wound Assessment Intact 09/25/20 1100  Wound Length (cm) 2.6 cm 09/21/20 1000  Wound Width (cm) 1.3 cm 09/21/20 1000  Wound Surface Area (cm^2) 3.38 cm^2 09/21/20 1000  Undermining (cm) 1.2 09/21/20 1000  Margins Unattached edges (unapproximated) 09/25/20 1100  Closure Sutures 09/25/20 1100  Drainage Amount Minimal 09/25/20 1100  Drainage Description Serosanguineous 09/25/20 1100  Non-staged Wound Description Full thickness 09/25/20 1100  Treatment Hydrotherapy (Pulse lavage);Packing (Impregnated strip) 09/25/20 1100      Hydrotherapy Pulsed lavage therapy - wound location: L wrist Pulsed Lavage with Suction (psi): 12 psi Pulsed Lavage with Suction - Normal Saline Used: 1000 mL Pulsed Lavage Tip: Tip with splash shield   Wound Assessment and Plan  Wound Therapy -  Assess/Plan/Recommendations Wound Therapy - Clinical Statement: Overall tolerated treatment well and wound continues to improve in appearance. Coban again utilized to keep wrap off of hand/webspace. Will continue to follow to decrease bioburden and promote wound bed healing. Wound Therapy - Functional Problem List: Decreased mobility, decreased wound healing, peripheral arterial disease Factors Delaying/Impairing Wound Healing: Diabetes Mellitus;Vascular compromise;Multiple medical problems Hydrotherapy Plan: Debridement;Dressing change;Pulsatile lavage with suction;Patient/family education Wound Therapy - Frequency: 6X / week Wound Therapy - Current Recommendations: Other (comment) (none) Wound Therapy - Follow Up Recommendations: Home health RN Wound Plan: See above  Wound Therapy Goals- Improve the function of patient's integumentary system by progressing the wound(s) through the phases of wound healing (inflammation - proliferation - remodeling) by: Decrease Necrotic Tissue to: 0 Decrease Necrotic Tissue - Progress: Updated due to goal met Increase Granulation Tissue to: 80 Increase Granulation Tissue - Progress: Updated due to goal met Additional Wound Therapy Goal: decrease undermining to <1 cm Additional Wound Therapy Goal - Progress: Progressing toward goal Goals/treatment plan/discharge plan were made with and agreed upon by patient/family: Yes Time For Goal Achievement: 7 days Wound Therapy - Potential for Goals: Excellent  Goals will be updated until maximal potential achieved or discharge criteria met.  Discharge criteria: when goals achieved, discharge from hospital, MD decision/surgical intervention, no progress towards goals, refusal/missing three consecutive treatments without notification or medical reason.  GP     Thelma Comp 09/25/2020, 11:05 AM   Rolinda Roan, PT, DPT Acute Rehabilitation Services Pager: [phone] Office: [phone]

## 2020-09-25 NOTE — Progress Notes (Signed)
[name]  WJX:[id number] DOB: January 21, 1981 DOA: 09/10/2020 PCP: Kerin Perna, NP    Brief Narrative:  39 year old with a history of ESRD, DM 2, HTN, and a left foot diabetic ulcer who presented to the ED with complaints of pain in the left hand with erythema swelling and warmth of the left foot.  During this hospitalization he has required a left BKA as well as I&D of a left wrist abscess.  Significant Events:  12/14 admit via ED 12/19 surgical I&D left wrist abscess 12/20 left BKA per vascular surgery  Antimicrobials:  Zosyn 12/14 Vancomycin 12/14 >12/23 Cefepime 12/15 >12/21 Ampicillin sulbactam 12/22 > 12/25 Cefazolin 12/26 >  DVT prophylaxis: Subcutaneous heparin  Subjective: Has refused his scheduled hemodialysis for today. Ongoing hydrotherapy to left wrist.  Is very concerned about his hypoglycemic spell last night.  Is also very concerned about regimen for pain that he will be sent home on.  States pain is very poorly controlled when Dilaudid wears off.  Reports poor intake and poor appetite.  Denies chest pain shortness of breath nausea or vomiting.  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  Possible bipolar disorder Exhibiting episodes of aggression psychosis and threatening  behavior toward self and staff during this hospital stay -required transient IVC during this hospital stay -psychiatry was consulted  Disposition difficulty Does not have access to appropriate assistance at home -clinically needs SNF -attempting to place at this time   Code Status: FULL CODE Family Communication: No family present at time of exam Status is: Inpatient  Remains inpatient appropriate because:IV treatments appropriate due to intensity of illness or inability to take PO   Dispo: The patient is from: Home              Anticipated d/c is to: SNF              Anticipated d/c date is: 2 days              Patient currently is not medically stable to d/c.  Consultants:  Vascular surgery Nephrology Hand surgery  Objective: Blood pressure (!) 143/97, pulse 84, temperature 98 F (36.7 C), temperature source Oral, resp. rate 16, height 5\' 7"  (1.702 m), weight 67.4 kg, SpO2 100 %.  Intake/Output Summary (Last 24 hours) at 09/25/2020 1049 Last data filed at 09/24/2020 1554 Gross per 24 hour  Intake 100 ml  Output 1117 ml  Net -1017 ml   Filed Weights   09/19/20 1420 09/24/20 0740 09/24/20 1050  Weight: 66.4 kg 68.6 kg 67.4 kg    Examination: General: No acute respiratory distress Lungs: Clear to auscultation bilaterally without wheezes or crackles Cardiovascular: Regular rate and rhythm without murmur gallop or rub normal S1 and S2 Abdomen: Nontender, nondistended, soft, bowel sounds positive, no rebound, no ascites, no appreciable mass Extremities: No significant cyanosis, clubbing, or edema bilateral lower extremities  CBC: Recent Labs  Lab 09/23/20 0655 09/24/20 0805 09/25/20 0144  WBC 20.7* 18.0* 14.8*  HGB 7.0* 6.7* 8.4*  HCT 23.7* 23.0* 27.1*  MCV 95.2 95.8 93.8  PLT 217 228 350   Basic Metabolic Panel: Recent Labs  Lab 09/19/20 1152 09/21/20 0847 09/22/20 1931 09/23/20 0655 09/24/20 0805  NA 129*   < > 129* 133* 132*  K 5.2*   < > 4.0 3.8 4.0   CL 93*   < > 88* 93* 94*  CO2 15*   < > 23 24 25   GLUCOSE 291*   < > 288* 239* 83  BUN 63*   < > 59* 27* 34*  CREATININE 9.10*   < > 8.56* 5.29* 6.66*  CALCIUM 8.9   < > 7.8* 7.9* 7.9*  PHOS 6.5*  --  6.3*  --  4.9*   < > = values in this interval not displayed.   GFR: Estimated Creatinine Clearance: 13.6 mL/min (A) (by C-G formula based on SCr of 6.66 mg/dL (H)).  Liver Function Tests: Recent Labs  Lab 09/19/20 1152 09/21/20 0847 09/22/20 1931 09/24/20 0805  AST  --  17 12*  --   ALT  --  22 19  --   ALKPHOS  --  187* 194*  --   BILITOT  --  1.7* 0.6  --   PROT  --  6.5 6.6  --   ALBUMIN 1.8* 1.8* 1.8* 1.7*    HbA1C: Hemoglobin A1C  Date/Time Value Ref Range Status  08/22/2014 06:28 AM 5.8 4.2 - 6.3 % Final    Comment:    The American Diabetes Association recommends that a primary goal of therapy should be <7% and that physicians should reevaluate the treatment regimen in patients with HbA1c values consistently >8%.   06/28/2014 08:04 PM 7.1 (H) 4.2 - 6.3 % Final    Comment:    The American Diabetes Association recommends that a primary goal of therapy should be <7% and that physicians should reevaluate the treatment regimen in patients with HbA1c values consistently >8%.    Hgb A1c MFr Bld  Date/Time Value Ref Range Status  09/25/2020 02:48 AM 7.3 (H) 4.8 - 5.6 % Final    Comment:    (NOTE) Pre diabetes:          5.7%-6.4%  Diabetes:              >6.4%  Glycemic control for   <7.0% adults with diabetes   07/22/2020 06:56 AM 7.5 (H) 4.8 - 5.6 % Final    Comment:    (NOTE)         Prediabetes: 5.7 - 6.4         Diabetes: >6.4         Glycemic control for adults with diabetes: <7.0     CBG: Recent Labs  Lab 09/24/20 1603 09/24/20 2114 09/24/20 2344 09/25/20 0014 09/25/20 0821  GLUCAP 180* 170* 38* 119* 151*    Recent Results (from the past 240 hour(s))  MRSA PCR Screening     Status: None   Collection Time: 09/16/20  5:08 AM   Specimen:  Nasal Mucosa; Nasopharyngeal  Result Value Ref Range Status   MRSA by PCR NEGATIVE NEGATIVE Final    Comment:        The GeneXpert MRSA Assay (FDA approved for NASAL specimens only), is one component of a comprehensive MRSA colonization surveillance program. It is not intended to diagnose MRSA infection nor to guide or  monitor treatment for MRSA infections. Performed at [facility], [place] [address] [place], [place] [address]   Anaerobic culture     Status: None   Collection Time: 09/16/20  3:49 PM   Specimen: Leg, Left; Wound  Result Value Ref Range Status   Specimen Description WOUND LEFT LEG  Final   Special Requests PT ON CEFEPIME LEFT LEG ANTERIOR COMPARTMENT  Final   Culture   Final    FEW BACTEROIDES THETAIOTAOMICRON BETA LACTAMASE POSITIVE Performed at [facility], [address], [place], [place] [address]    Report Status 09/19/2020 FINAL  Final  Aerobic Culture (superficial specimen)     Status: None   Collection Time: 09/16/20  3:49 PM   Specimen: Leg, Left; Wound  Result Value Ref Range Status   Specimen Description WOUND LEFT LEG  Final   Special Requests PT ON CEFEPIME LEFT LEG ANTERIOR COMPARTMENT  Final   Gram Stain   Final    NO WBC SEEN NO ORGANISMS SEEN Performed at [facility], [address], [place], [place] [address]    Culture FEW ENTEROCOCCUS FAECALIS FEW PROTEUS MIRABILIS   Final   Report Status 09/20/2020 FINAL  Final   Organism ID, Bacteria ENTEROCOCCUS FAECALIS  Final   Organism ID, Bacteria PROTEUS MIRABILIS  Final      Susceptibility   Enterococcus faecalis - MIC*    AMPICILLIN <=2 SENSITIVE Sensitive     VANCOMYCIN 1 SENSITIVE Sensitive     GENTAMICIN SYNERGY SENSITIVE Sensitive     * FEW ENTEROCOCCUS FAECALIS   Proteus mirabilis - MIC*    AMPICILLIN <=2 SENSITIVE Sensitive     CEFAZOLIN <=4 SENSITIVE Sensitive     CEFEPIME <=0.12 SENSITIVE Sensitive     CEFTAZIDIME <=1 SENSITIVE Sensitive     CEFTRIAXONE  <=0.25 SENSITIVE Sensitive     CIPROFLOXACIN <=0.25 SENSITIVE Sensitive     GENTAMICIN <=1 SENSITIVE Sensitive     IMIPENEM 2 SENSITIVE Sensitive     TRIMETH/SULFA <=20 SENSITIVE Sensitive     AMPICILLIN/SULBACTAM <=2 SENSITIVE Sensitive     PIP/TAZO <=4 SENSITIVE Sensitive     * FEW PROTEUS MIRABILIS  Culture, blood (Routine X 2) w Reflex to ID Panel     Status: None (Preliminary result)   Collection Time: 09/21/20  8:51 AM   Specimen: BLOOD  Result Value Ref Range Status   Specimen Description BLOOD RIGHT ANTECUBITAL  Final   Special Requests   Final    BOTTLES DRAWN AEROBIC AND ANAEROBIC Blood Culture adequate volume   Culture   Final    NO GROWTH 4 DAYS Performed at [facility], [address] [place], [place] [address]    Report Status PENDING  Incomplete  Culture, blood (Routine X 2) w Reflex to ID Panel     Status: None (Preliminary result)   Collection Time: 09/21/20  8:51 AM   Specimen: BLOOD  Result Value Ref Range Status   Specimen Description BLOOD RIGHT ANTECUBITAL  Final   Special Requests   Final    BOTTLES DRAWN AEROBIC AND ANAEROBIC Blood Culture results may not be optimal due to an inadequate volume of blood received in culture bottles   Culture   Final    NO GROWTH 4 DAYS Performed at [facility], [place] [address], [place], [place] [address]    Report Status PENDING  Incomplete     Scheduled Meds: . (feeding supplement) PROSource Plus  30 mL Oral  TID BM  . sodium chloride   Intravenous Once  . calcitRIOL  2.5 mcg Oral Q M,W,F-HD  . Chlorhexidine Gluconate Cloth  6 each Topical Q0600  . Chlorhexidine Gluconate Cloth  6 each Topical Q0600  . cinacalcet  60 mg Oral Q breakfast  . [START ON 09/27/2020] darbepoetin (ARANESP) injection - DIALYSIS  150 mcg Intravenous Q Fri-HD  . heparin  5,000 Units Subcutaneous Q8H  . heparin sodium (porcine)  1,000 Units Intravenous Q M,W,F-HD  . insulin aspart  0-6 Units Subcutaneous TID WC  . insulin glargine   6 Units Subcutaneous QHS  . multivitamin  1 tablet Oral QHS  . sevelamer carbonate  4,000 mg Oral TID WC   Continuous Infusions: .  ceFAZolin (ANCEF) IV Stopped (09/24/20 1631)     LOS: 14 days   Cherene Altes, MD [facility] Office  [phone] Pager - Text Page per Amion  If 7PM-7AM, please contact night-coverage per Amion 09/25/2020, 10:49 AM

## 2020-09-25 NOTE — TOC Progression Note (Signed)
Transition of Care [facility]) - Progression Note    Patient Details  Name: [name] MRN: [id number] Date of Birth: 03/08/1981  Transition of Care [facility]) CM/SW [place], RN Phone Number: 09/25/2020, 2:28 PM  Clinical Narrative:    Case management met with the patient at the bedside regarding transitions of care.  The patient states that he is willing to be admitted to a rehabilitation facility for short term rehab.  I gave the patient choice regarding SNF placement and started the patient's SNF work up and sent him out in the hub to multiple facilities in the area.  The patient will need to choose a facility once bed availability is established. The patient will need insurance authorization once he chooses a SNF facility.  Expected Discharge Plan: Home/Self Care Barriers to Discharge: Continued Medical Work up  Expected Discharge Plan and Services Expected Discharge Plan: Home/Self Care   Discharge Planning Services: CM Consult                                           Social Determinants of Health (SDOH) Interventions    Readmission Risk Interventions No flowsheet data found.

## 2020-09-26 DIAGNOSIS — N186 End stage renal disease: Secondary | ICD-10-CM | POA: Diagnosis not present

## 2020-09-26 DIAGNOSIS — M86172 Other acute osteomyelitis, left ankle and foot: Secondary | ICD-10-CM | POA: Diagnosis not present

## 2020-09-26 DIAGNOSIS — F4320 Adjustment disorder, unspecified: Secondary | ICD-10-CM | POA: Diagnosis not present

## 2020-09-26 DIAGNOSIS — Z992 Dependence on renal dialysis: Secondary | ICD-10-CM | POA: Diagnosis not present

## 2020-09-26 LAB — CBC
HCT: 29.4 % — ABNORMAL LOW (ref 39.0–52.0)
Hemoglobin: 9.1 g/dL — ABNORMAL LOW (ref 13.0–17.0)
MCH: 28.6 pg (ref 26.0–34.0)
MCHC: 31 g/dL (ref 30.0–36.0)
MCV: 92.5 fL (ref 80.0–100.0)
Platelets: 248 10*3/uL (ref 150–400)
RBC: 3.18 MIL/uL — ABNORMAL LOW (ref 4.22–5.81)
RDW: 16.8 % — ABNORMAL HIGH (ref 11.5–15.5)
WBC: 11.3 10*3/uL — ABNORMAL HIGH (ref 4.0–10.5)
nRBC: 0 % (ref 0.0–0.2)

## 2020-09-26 LAB — RENAL FUNCTION PANEL
Albumin: 1.8 g/dL — ABNORMAL LOW (ref 3.5–5.0)
Anion gap: 14 (ref 5–15)
BUN: 28 mg/dL — ABNORMAL HIGH (ref 6–20)
CO2: 23 mmol/L (ref 22–32)
Calcium: 8.3 mg/dL — ABNORMAL LOW (ref 8.9–10.3)
Chloride: 93 mmol/L — ABNORMAL LOW (ref 98–111)
Creatinine, Ser: 6.05 mg/dL — ABNORMAL HIGH (ref 0.61–1.24)
GFR, Estimated: 11 mL/min — ABNORMAL LOW (ref >60)
Glucose, Bld: 345 mg/dL — ABNORMAL HIGH (ref 70–99)
Phosphorus: 5.4 mg/dL — ABNORMAL HIGH (ref 2.5–4.6)
Potassium: 4.8 mmol/L (ref 3.5–5.1)
Sodium: 130 mmol/L — ABNORMAL LOW (ref 135–145)

## 2020-09-26 LAB — CULTURE, BLOOD (ROUTINE X 2)
Culture: NO GROWTH
Culture: NO GROWTH
Special Requests: ADEQUATE

## 2020-09-26 LAB — GLUCOSE, CAPILLARY
Glucose-Capillary: 317 mg/dL — ABNORMAL HIGH (ref 70–99)
Glucose-Capillary: 139 mg/dL — ABNORMAL HIGH (ref 70–99)
Glucose-Capillary: 341 mg/dL — ABNORMAL HIGH (ref 70–99)
Glucose-Capillary: 95 mg/dL (ref 70–99)

## 2020-09-26 MED ORDER — OXYCODONE HCL 5 MG PO TABS
ORAL_TABLET | ORAL | Status: AC
  Filled 2020-09-26: qty 1

## 2020-09-26 MED ORDER — HEPARIN SODIUM (PORCINE) 1000 UNIT/ML IJ SOLN
INTRAMUSCULAR | Status: AC
  Administered 2020-09-26: 2000 [IU] via INTRAVENOUS
  Filled 2020-09-26: qty 4

## 2020-09-26 MED ORDER — HEPARIN SODIUM (PORCINE) 1000 UNIT/ML IJ SOLN
INTRAMUSCULAR | Status: AC
  Administered 2020-09-26: 1000 [IU] via INTRAVENOUS
  Filled 2020-09-26: qty 1

## 2020-09-26 NOTE — Progress Notes (Signed)
[facility] Progress Note   Subjective:  Seen on HD - 2L UF goal and tolerating. No CP or dyspnea. SNF placement pending, but tells me he is leaving by Jan 1.  Objective Vitals:   09/25/20 2120 09/26/20 0749 09/26/20 0806 09/26/20 0815  BP: (!) 143/90 (!) 162/112 (!) 150/113 (!) 139/102  Pulse: 83 86 73 78  Resp: 15     Temp: 98.3 F (36.8 C) 98.6 F (37 C)    TempSrc: Oral Oral    SpO2: 99%     Weight:      Height:       Physical Exam General:Well appearing,NAD. Room air. Heart:RRR; no murmur Lungs:CTA anteriorly Abdomen:soft Extremities:L BKA, RLE without edema. L wrist bandaged Dialysis Access:TDC  Additional Objective Labs: Basic Metabolic Panel: Recent Labs  Lab 09/22/20 1931 09/23/20 0655 09/24/20 0805 09/26/20 0241  NA 129* 133* 132* 130*  K 4.0 3.8 4.0 4.8  CL 88* 93* 94* 93*  CO2 23 24 25 23   GLUCOSE 288* 239* 83 345*  BUN 59* 27* 34* 28*  CREATININE 8.56* 5.29* 6.66* 6.05*  CALCIUM 7.8* 7.9* 7.9* 8.3*  PHOS 6.3*  --  4.9* 5.4*   Liver Function Tests: Recent Labs  Lab 09/21/20 0847 09/22/20 1931 09/24/20 0805 09/26/20 0241  AST 17 12*  --   --   ALT 22 19  --   --   ALKPHOS 187* 194*  --   --   BILITOT 1.7* 0.6  --   --   PROT 6.5 6.6  --   --   ALBUMIN 1.8* 1.8* 1.7* 1.8*   CBC: Recent Labs  Lab 09/22/20 1931 09/23/20 0655 09/24/20 0805 09/25/20 0144 09/26/20 0241  WBC 20.6* 20.7* 18.0* 14.8* 11.3*  HGB 6.9* 7.0* 6.7* 8.4* 9.1*  HCT 23.1* 23.7* 23.0* 27.1* 29.4*  MCV 93.1 95.2 95.8 93.8 92.5  PLT 225 217 228 230 248   Medications: .  ceFAZolin (ANCEF) IV 1 g (09/25/20 1759)   . (feeding supplement) PROSource Plus  30 mL Oral TID BM  . calcitRIOL  2.5 mcg Oral Q M,W,F-HD  . Chlorhexidine Gluconate Cloth  6 each Topical Q0600  . cinacalcet  60 mg Oral Q breakfast  . [START ON 09/27/2020] darbepoetin (ARANESP) injection - DIALYSIS  150 mcg Intravenous Q Fri-HD  . heparin  5,000 Units Subcutaneous Q8H  .  heparin sodium (porcine)      . heparin sodium (porcine)  1,000 Units Intravenous Q M,W,F-HD  . insulin aspart  0-6 Units Subcutaneous TID WC  . multivitamin  1 tablet Oral QHS  . oxyCODONE      . sevelamer carbonate  4,000 mg Oral TID WC    Dialysis Orders: MWF @ [facility] 4:15hr, 2K/2Ca, 400/800, EDW 67kg, TDC, heparin 5000 + 5000 mid-run bolus - Calcitriol 73mcg PO q HD - No ESA ** Scheduled for R BC AVF on 09/24/20, now delayed per patient request.  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 On Cefazolin. Ongoing pain issues. VVS following, small area of wound dehissence. 2. ESRD:Usual MWF schedule - refused Monday - dialyzed Tues, refused Wed, but agreeable for Thursday - HD now, 2L UF. Gave option for next HD either tomorrow (Fri) or Sunday - he will think about it and let me know. 3.HTN/volume:BP high, follow with UF. 4. Anemia:S/p 1U PRBCs on 12/27. Hgb improved to 9.1. Aranesp dose increased. 5. Secondary hyperparathyroidism:CorrCa/Phos good- continue binders/VDRA/sensipar. 6. Nutrition:Alb low - continue protein supplements. 7. Bipolar  disorder + underlying personality disorder:Severe aggression last week - improved over past few days. Psych consulted on 12/24 - did not meet criteria for inpatient psych. 8.L wrist abscess: S/p I&D 12/19 - will need 1 more week abx per notes. 9. T2DM: Chronically uncontrolled. 10. Dispo: Plan is d/c to SNF/rehab - pending.   Veneta Penton, PA-C 09/26/2020, 8:40 AM  [employer]

## 2020-09-26 NOTE — Progress Notes (Signed)
Physical Therapy Wound Treatment Patient Details  Name: [name] MRN: [id number] Date of Birth: Oct 21, 1980  Today's Date: 09/26/2020 Time: 4497-5300 Time Calculation (min): 25 min  Subjective  Subjective: Pleasant and agreeable to hydrotherapy Patient and Family Stated Goals: Heal the wound Date of Onset: 09/15/20 Prior Treatments: surgical I&D 12/19  Pain Score:  Pt premedicated with IV pain meds.   Wound Assessment  Dressing Type Compression wrap;Gauze (Comment) 09/26/20 1400  Dressing Changed Changed 09/26/20 1400  Dressing Status Clean;Dry;Intact 09/26/20 1400  Dressing Change Frequency Daily 09/26/20 1400  Site / Wound Assessment Yellow;Pink 09/26/20 1400  % Wound base Red or Granulating 75% 09/26/20 1400  % Wound base Yellow/Fibrinous Exudate 5% 09/26/20 1400  % Wound base Black/Eschar 0% 09/26/20 1400  % Wound base Other/Granulation Tissue (Comment) 20% 09/26/20 1400  Peri-wound Assessment Intact 09/26/20 1400  Wound Length (cm) 2.6 cm 09/21/20 1000  Wound Width (cm) 1.3 cm 09/21/20 1000  Wound Surface Area (cm^2) 3.38 cm^2 09/21/20 1000  Undermining (cm) 1.2 09/21/20 1000  Margins Unattached edges (unapproximated) 09/26/20 1400  Closure Sutures 09/26/20 1400  Drainage Amount Minimal 09/26/20 1400  Drainage Description Serosanguineous 09/26/20 1400  Non-staged Wound Description Full thickness 09/26/20 1400  Treatment Hydrotherapy (Pulse lavage);Packing (Impregnated strip) 09/26/20 1400      Hydrotherapy Pulsed lavage therapy - wound location: L wrist Pulsed Lavage with Suction (psi): 8 psi Pulsed Lavage with Suction - Normal Saline Used: 1000 mL Pulsed Lavage Tip: Tip with splash shield   Wound Assessment and Plan  Wound Therapy - Assess/Plan/Recommendations Wound Therapy - Clinical Statement: Wound continues to improve. Likely can discontinue hydrotherapy very soon due to wound improement and clean wound bed. Wound Therapy - Functional Problem List:  Decreased mobility, decreased wound healing, peripheral arterial disease Factors Delaying/Impairing Wound Healing: Diabetes Mellitus;Vascular compromise;Multiple medical problems Hydrotherapy Plan: Debridement;Dressing change;Pulsatile lavage with suction;Patient/family education Wound Therapy - Frequency: 6X / week Wound Therapy - Current Recommendations: Other (comment) (none) Wound Therapy - Follow Up Recommendations: Home health RN Wound Plan: See above  Wound Therapy Goals- Improve the function of patient's integumentary system by progressing the wound(s) through the phases of wound healing (inflammation - proliferation - remodeling) by: Decrease Necrotic Tissue to: 0 Decrease Necrotic Tissue - Progress: Progressing toward goal Increase Granulation Tissue to: 80 Increase Granulation Tissue - Progress: Progressing toward goal Additional Wound Therapy Goal: decrease undermining to <1 cm Additional Wound Therapy Goal - Progress: Progressing toward goal  Goals will be updated until maximal potential achieved or discharge criteria met.  Discharge criteria: when goals achieved, discharge from hospital, MD decision/surgical intervention, no progress towards goals, refusal/missing three consecutive treatments without notification or medical reason.  GP     [name] 09/26/2020, 2:47 PM [place] Pager [phone] Office [phone]

## 2020-09-26 NOTE — Progress Notes (Signed)
[name]  ZOX:[id number] DOB: Oct 15, 1980 DOA: 09/10/2020 PCP: Kerin Perna, NP    Brief Narrative:  39 year old with a history of ESRD, DM 2, HTN, and a left foot diabetic ulcer who presented to the ED with complaints of pain in the left hand with erythema swelling and warmth of the left foot.  During this hospitalization he has required a left BKA as well as I&D of a left wrist abscess.  Significant Events:  12/14 admit via ED 12/19 surgical I&D left wrist abscess 12/20 left BKA per vascular surgery  Antimicrobials:  Zosyn 12/14 Vancomycin 12/14 >12/23 Cefepime 12/15 >12/21 Ampicillin sulbactam 12/22 > 12/25 Cefazolin 12/26 >  DVT prophylaxis: Subcutaneous heparin  Subjective: Receiving dialysis today.  Search for SNF bed has begun.  Blood pressure trending up but vital signs otherwise stable.  Significant hyperglycemia noted today in absence of Lantus last night.  No new complaints.  Agrees with short-term SNF placement.  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 hyperglycemia -remain off Lantus for now at patient's request and follow trends  Anemia of chronic kidney disease Transfused 1 unit PRBC with dialysis -hemoglobin steady  Possible bipolar disorder Exhibiting episodes of aggression psychosis  and threatening behavior toward self and staff during this hospital stay -required transient IVC during this hospital stay -psychiatry was consulted  Disposition difficulty Does not have access to appropriate assistance at home -clinically needs SNF -attempting to place at this time   Code Status: FULL CODE Family Communication: No family present at time of exam Status is: Inpatient  Remains inpatient appropriate because:IV treatments appropriate due to intensity of illness or inability to take PO and Inpatient level of care appropriate due to severity of illness   Dispo: The patient is from: Home              Anticipated d/c is to: SNF              Anticipated d/c date is: 1 day              Patient currently is medically stable to d/c.  Consultants:  Vascular surgery Nephrology Hand surgery  Objective: Blood pressure (!) 139/102, pulse 78, temperature 98.6 F (37 C), temperature source Oral, resp. rate 15, height 5\' 7"  (1.702 m), weight 69.3 kg, SpO2 99 %.  Intake/Output Summary (Last 24 hours) at 09/26/2020 0906 Last data filed at 09/25/2020 1300 Gross per 24 hour  Intake 340 ml  Output --  Net 340 ml   Filed Weights   09/24/20 0740 09/24/20 1050 09/26/20 0749  Weight: 68.6 kg 67.4 kg 69.3 kg    Examination: General: No acute respiratory distress Lungs: Clear to auscultation bilaterally  Cardiovascular: Regular rate and rhythm without murmur  Abdomen: Soft, bowel sounds positive, no rebound Extremities: No significant edema   CBC: Recent Labs  Lab 09/24/20 0805 09/25/20 0144 09/26/20 0241  WBC  18.0* 14.8* 11.3*  HGB 6.7* 8.4* 9.1*  HCT 23.0* 27.1* 29.4*  MCV 95.8 93.8 92.5  PLT 228 230 892   Basic Metabolic Panel: Recent Labs  Lab 09/22/20 1931 09/23/20 0655 09/24/20 0805 09/26/20 0241  NA 129* 133* 132* 130*  K 4.0 3.8 4.0 4.8  CL 88* 93* 94* 93*  CO2 23 24 25 23   GLUCOSE 288* 239* 83 345*  BUN 59* 27* 34* 28*  CREATININE 8.56* 5.29* 6.66*  6.05*  CALCIUM 7.8* 7.9* 7.9* 8.3*  PHOS 6.3*  --  4.9* 5.4*   GFR: Estimated Creatinine Clearance: 15 mL/min (A) (by C-G formula based on SCr of 6.05 mg/dL (H)).  Liver Function Tests: Recent Labs  Lab 09/21/20 0847 09/22/20 1931 09/24/20 0805 09/26/20 0241  AST 17 12*  --   --   ALT 22 19  --   --   ALKPHOS 187* 194*  --   --   BILITOT 1.7* 0.6  --   --   PROT 6.5 6.6  --   --   ALBUMIN 1.8* 1.8* 1.7* 1.8*    HbA1C: Hemoglobin A1C  Date/Time Value Ref Range Status  08/22/2014 06:28 AM 5.8 4.2 - 6.3 % Final    Comment:    The American Diabetes Association recommends that a primary goal of therapy should be <7% and that physicians should reevaluate the treatment regimen in patients with HbA1c values consistently >8%.   06/28/2014 08:04 PM 7.1 (H) 4.2 - 6.3 % Final    Comment:    The American Diabetes Association recommends that a primary goal of therapy should be <7% and that physicians should reevaluate the treatment regimen in patients with HbA1c values consistently >8%.    Hgb A1c MFr Bld  Date/Time Value Ref Range Status  09/25/2020 02:48 AM 7.3 (H) 4.8 - 5.6 % Final    Comment:    (NOTE) Pre diabetes:          5.7%-6.4%  Diabetes:              >6.4%  Glycemic control for   <7.0% adults with diabetes   07/22/2020 06:56 AM 7.5 (H) 4.8 - 5.6 % Final    Comment:    (NOTE)         Prediabetes: 5.7 - 6.4         Diabetes: >6.4         Glycemic control for adults with diabetes: <7.0     CBG: Recent Labs  Lab 09/25/20 0014 09/25/20 0821 09/25/20 1136 09/25/20 1658 09/25/20 2119  GLUCAP 119* 151* 154* 280* 213*    Recent Results (from the past 240 hour(s))  Anaerobic culture     Status: None   Collection Time: 09/16/20  3:49 PM   Specimen: Leg, Left; Wound  Result Value Ref Range Status   Specimen Description WOUND LEFT LEG  Final   Special Requests PT ON CEFEPIME LEFT LEG ANTERIOR COMPARTMENT  Final   Culture   Final    FEW BACTEROIDES  THETAIOTAOMICRON BETA LACTAMASE POSITIVE Performed at [facility], [address] [place], [place] [address]    Report Status 09/19/2020 FINAL  Final  Aerobic Culture (superficial specimen)     Status: None   Collection Time: 09/16/20  3:49 PM   Specimen: Leg, Left; Wound  Result Value Ref Range Status   Specimen Description WOUND LEFT LEG  Final   Special Requests PT ON CEFEPIME LEFT LEG ANTERIOR COMPARTMENT  Final  Gram Stain   Final    NO WBC SEEN NO ORGANISMS SEEN Performed at [facility], [place] [address] [place], [place] [address]    Culture FEW ENTEROCOCCUS FAECALIS FEW PROTEUS MIRABILIS   Final   Report Status 09/20/2020 FINAL  Final   Organism ID, Bacteria ENTEROCOCCUS FAECALIS  Final   Organism ID, Bacteria PROTEUS MIRABILIS  Final      Susceptibility   Enterococcus faecalis - MIC*    AMPICILLIN <=2 SENSITIVE Sensitive     VANCOMYCIN 1 SENSITIVE Sensitive     GENTAMICIN SYNERGY SENSITIVE Sensitive     * FEW ENTEROCOCCUS FAECALIS   Proteus mirabilis - MIC*    AMPICILLIN <=2 SENSITIVE Sensitive     CEFAZOLIN <=4 SENSITIVE Sensitive     CEFEPIME <=0.12 SENSITIVE Sensitive     CEFTAZIDIME <=1 SENSITIVE Sensitive     CEFTRIAXONE <=0.25 SENSITIVE Sensitive     CIPROFLOXACIN <=0.25 SENSITIVE Sensitive     GENTAMICIN <=1 SENSITIVE Sensitive     IMIPENEM 2 SENSITIVE Sensitive     TRIMETH/SULFA <=20 SENSITIVE Sensitive     AMPICILLIN/SULBACTAM <=2 SENSITIVE Sensitive     PIP/TAZO <=4 SENSITIVE Sensitive     * FEW PROTEUS MIRABILIS  Culture, blood (Routine X 2) w Reflex to ID Panel     Status: None   Collection Time: 09/21/20  8:51 AM   Specimen: BLOOD  Result Value Ref Range Status   Specimen Description BLOOD RIGHT ANTECUBITAL  Final   Special Requests   Final    BOTTLES DRAWN AEROBIC AND ANAEROBIC Blood Culture adequate volume   Culture   Final    NO GROWTH 5 DAYS Performed at [facility], [address], [place], [place] [address]     Report Status 09/26/2020 FINAL  Final  Culture, blood (Routine X 2) w Reflex to ID Panel     Status: None   Collection Time: 09/21/20  8:51 AM   Specimen: BLOOD  Result Value Ref Range Status   Specimen Description BLOOD RIGHT ANTECUBITAL  Final   Special Requests   Final    BOTTLES DRAWN AEROBIC AND ANAEROBIC Blood Culture results may not be optimal due to an inadequate volume of blood received in culture bottles   Culture   Final    NO GROWTH 5 DAYS Performed at [facility], [place] [address] [place],  [address]    Report Status 09/26/2020 FINAL  Final     Scheduled Meds: . (feeding supplement) PROSource Plus  30 mL Oral TID BM  . calcitRIOL  2.5 mcg Oral Q M,W,F-HD  . Chlorhexidine Gluconate Cloth  6 each Topical Q0600  . cinacalcet  60 mg Oral Q breakfast  . [START ON 09/27/2020] darbepoetin (ARANESP) injection - DIALYSIS  150 mcg Intravenous Q Fri-HD  . heparin  5,000 Units Subcutaneous Q8H  . heparin sodium (porcine)      . heparin sodium (porcine)  1,000 Units Intravenous Q M,W,F-HD  . insulin aspart  0-6 Units Subcutaneous TID WC  . multivitamin  1 tablet Oral QHS  . oxyCODONE      . sevelamer carbonate  4,000 mg Oral TID WC   Continuous Infusions: .  ceFAZolin (ANCEF) IV 1 g (09/25/20 1759)     LOS: 15 days   Cherene Altes, MD [facility] Office  [phone] Pager - Text Page per Amion  If 7PM-7AM, please contact night-coverage per Amion 09/26/2020, 9:06 AM

## 2020-09-26 NOTE — TOC Progression Note (Signed)
Transition of Care [facility]) - Progression Note    Patient Details  Name: [name] MRN: [id number] Date of Birth: 1981-07-09  [facility]) CM/SW [place], RN Phone Number: 09/26/2020, 4:43 PM  Clinical Narrative:    Case management spoke with both accepting SNF facilities in the hub and they will accept the patient for admission as long as he is no longer needing hydrotherapy.  I called and left a message with Dr. Leanora Cover at his office to ask how long patient will need this therapy for his left wrist.  At this time, the patient does not have any accepting SNF facilities for admission as long as he is needing hydrotherapy.  Dr. Fredna Dow will be back in the office on Monday, September 30, 2020.  At this time I do not have SNF bed availability for the patient while he is requiring hydrotherapy. The patient's HD sessions will have to be coordinated with the renal navigator once the patient is closer to [facility] admission.  Expected Discharge Plan: Home/Self Care Barriers to Discharge: Continued Medical Work up  Expected Discharge Plan and Services Expected Discharge Plan: Home/Self Care   Discharge Planning Services: CM Consult                                           Social Determinants of Health (SDOH) Interventions    Readmission Risk Interventions No flowsheet data found.

## 2020-09-27 DIAGNOSIS — M86172 Other acute osteomyelitis, left ankle and foot: Secondary | ICD-10-CM | POA: Diagnosis not present

## 2020-09-27 DIAGNOSIS — F4320 Adjustment disorder, unspecified: Secondary | ICD-10-CM | POA: Diagnosis not present

## 2020-09-27 DIAGNOSIS — E08621 Diabetes mellitus due to underlying condition with foot ulcer: Secondary | ICD-10-CM | POA: Diagnosis not present

## 2020-09-27 DIAGNOSIS — N186 End stage renal disease: Secondary | ICD-10-CM | POA: Diagnosis not present

## 2020-09-27 LAB — GLUCOSE, CAPILLARY
Glucose-Capillary: 215 mg/dL — ABNORMAL HIGH (ref 70–99)
Glucose-Capillary: 179 mg/dL — ABNORMAL HIGH (ref 70–99)
Glucose-Capillary: 148 mg/dL — ABNORMAL HIGH (ref 70–99)

## 2020-09-27 MED ORDER — DARBEPOETIN ALFA 150 MCG/0.3ML IJ SOSY
150.0000 ug | PREFILLED_SYRINGE | INTRAMUSCULAR | Status: DC
  Filled 2020-09-27: qty 0.3

## 2020-09-27 NOTE — TOC Progression Note (Signed)
Transition of Care [facility]) - Progression Note    Patient Details  Name: [name] MRN: [id number] Date of Birth: 1981-09-17  Transition of Care [facility]) CM/SW [place], RN Phone Number: 09/27/2020, 11:13 AM  Clinical Narrative:    Case management spoke with the patient at the bedside regarding transitions of care to a SNF.  The patient was given Medicare choice regarding SNF placement and the patient and patient's mother chose [facility].  I called [name] and they are checking into bed availability for the patient since the patient will need a quarantine room since he is unvaccinated for [place].  The patient's orders for hydrotherapy were changed to wet-to-dry by the physician at this time.   [name], [place] at [facility] called back and the facility is able to offer a bed to the patient and has started insurance authorization for the patient's placement.    Case management will continue to follow the patient for [facility] placement at [facility] in [place] once [name] has been approved.   Expected Discharge Plan: Home/Self Care Barriers to Discharge: Continued Medical Work up  Expected Discharge Plan and Services Expected Discharge Plan: Home/Self Care   Discharge Planning Services: CM Consult                                           Social Determinants of Health (SDOH) Interventions    Readmission Risk Interventions No flowsheet data found.

## 2020-09-27 NOTE — Progress Notes (Signed)
[name] Progress Note   Subjective:   Pt seen in room. No specific physical complaints but a bit difficult to redirect this AM. He is very eager to discharge before the new year.  Does not want HD two days in a row, will plan for next HD tomorrow if still admitted.   Objective Vitals:   09/26/20 1536 09/26/20 2114 09/27/20 0641 09/27/20 0758  BP: 124/80 114/86 128/85 122/90  Pulse: 93 88 90 88  Resp: 16 18 18 14   Temp: 98.4 F (36.9 C) 98.4 F (36.9 C) 98.6 F (37 C)   TempSrc: Oral Oral Oral   SpO2: 96% 98% 100% 98%  Weight:      Height:       Physical Exam General: Well developed, alert male in NAD Heart: RRR, no murmurs, rubs or gallops Lungs: CTA bilaterally without wheezing, rhonchi or rales Abdomen: Soft, non-tender, non-distended, +BS Extremities: L BKA, RLE no edema Dialysis Access:  R IJ [facility]  Additional Objective Labs: Basic Metabolic Panel: Recent Labs  Lab 09/22/20 1931 09/23/20 0655 09/24/20 0805 09/26/20 0241  NA 129* 133* 132* 130*  K 4.0 3.8 4.0 4.8  CL 88* 93* 94* 93*  CO2 23 24 25 23   GLUCOSE 288* 239* 83 345*  BUN 59* 27* 34* 28*  CREATININE 8.56* 5.29* 6.66* 6.05*  CALCIUM 7.8* 7.9* 7.9* 8.3*  PHOS 6.3*  --  4.9* 5.4*   Liver Function Tests: Recent Labs  Lab 09/21/20 0847 09/22/20 1931 09/24/20 0805 09/26/20 0241  AST 17 12*  --   --   ALT 22 19  --   --   ALKPHOS 187* 194*  --   --   BILITOT 1.7* 0.6  --   --   PROT 6.5 6.6  --   --   ALBUMIN 1.8* 1.8* 1.7* 1.8*   CBC: Recent Labs  Lab 09/22/20 1931 09/23/20 0655 09/24/20 0805 09/25/20 0144 09/26/20 0241  WBC 20.6* 20.7* 18.0* 14.8* 11.3*  HGB 6.9* 7.0* 6.7* 8.4* 9.1*  HCT 23.1* 23.7* 23.0* 27.1* 29.4*  MCV 93.1 95.2 95.8 93.8 92.5  PLT 225 217 228 230 248   Blood Culture    Component Value Date/Time   SDES BLOOD RIGHT ANTECUBITAL 09/21/2020 0851   SDES BLOOD RIGHT ANTECUBITAL 09/21/2020 0851   SPECREQUEST  09/21/2020 0851    BOTTLES DRAWN  AEROBIC AND ANAEROBIC Blood Culture adequate volume   SPECREQUEST  09/21/2020 0851    BOTTLES DRAWN AEROBIC AND ANAEROBIC Blood Culture results may not be optimal due to an inadequate volume of blood received in culture bottles   CULT  09/21/2020 0851    NO GROWTH 5 DAYS Performed at [facility], [address] [place], [place] [address]    CULT  09/21/2020 [phone]    NO GROWTH 5 DAYS Performed at [facility] [address], [place], [place] [address]    REPTSTATUS 09/26/2020 FINAL 09/21/2020 0851   REPTSTATUS 09/26/2020 FINAL 09/21/2020 0851   CBG: Recent Labs  Lab 09/26/20 0753 09/26/20 1144 09/26/20 1608 09/26/20 2145 09/27/20 0639  GLUCAP 317* 139* 341* 95 215*   Medications: .  ceFAZolin (ANCEF) IV 1 g (09/26/20 1849)   . (feeding supplement) PROSource Plus  30 mL Oral TID BM  . calcitRIOL  2.5 mcg Oral Q M,W,F-HD  . Chlorhexidine Gluconate Cloth  6 each Topical Q0600  . cinacalcet  60 mg Oral Q breakfast  . darbepoetin (ARANESP) injection - DIALYSIS  150  mcg Intravenous Q Fri-HD  . heparin  5,000 Units Subcutaneous Q8H  . heparin sodium (porcine)  1,000 Units Intravenous Q M,W,F-HD  . insulin aspart  0-6 Units Subcutaneous TID WC  . multivitamin  1 tablet Oral QHS  . sevelamer carbonate  4,000 mg Oral TID WC    Dialysis Orders:  MWF @ [facility] 4:15hr, 2K/2Ca, 400/800, EDW 67kg, TDC, heparin 5000 + 5000 mid-run bolus - Calcitriol 19mcg PO q HD - No ESA ** Scheduled for R BC AVF on 09/24/20, now delayed per patient request.   Assessment/Plan:  1.L foot/ankle osteomyelitis + sepsis:S/p L BKA 12/20. On Cefazolin. Ongoing pain issues. VVS following, small area of wound dehissence. 2. ESRD:Usual MWF schedule. Intermittent refusal this week, ended up having HD Tuesday and Thursday. Does not want HD two days in a row and no emergent indications, will plan for next HD tomorrow if still admitted, pt is agreeable. Resume MWF next week.  3.HTN/volume:BP  high, follow with UF. 4. Anemia:S/p1U PRBCs on 12/27. Hgb improved to 9.1. Aranesp dose increased. 5. Secondary hyperparathyroidism:CorrCa/Phos good- continue binders/VDRA/sensipar. 6. Nutrition:Alb low - continue protein supplements. 7. Bipolar disorder + underlying personality disorder:Severe aggression last week - improved over past few days. Psych consulted on 12/24 - did not meet criteria for inpatient psych. 8.L wrist abscess: S/p I&D 12/19 - will need 1 more week abx per notes. 9. T2DM: Chronically uncontrolled. 10. Dispo: Plan is d/c to SNF/rehab - pending.   Anice Paganini, PA-C 09/27/2020, 9:42 AM  [name] Pager: [phone]

## 2020-09-27 NOTE — Plan of Care (Addendum)
Pt refused for his CBG to be taken at this time.   Problem: Clinical Measurements: Goal: Ability to maintain clinical measurements within normal limits will improve 09/27/2020 1205 by Williams Che, RN Outcome: Progressing 09/27/2020 1053 by Williams Che, RN Outcome: Progressing   Problem: Activity: Goal: Risk for activity intolerance will decrease Outcome: Progressing   Problem: Nutrition: Goal: Adequate nutrition will be maintained 09/27/2020 1205 by Williams Che, RN Outcome: Progressing 09/27/2020 1053 by Williams Che, RN Outcome: Progressing   Problem: Coping: Goal: Level of anxiety will decrease 09/27/2020 1205 by Williams Che, RN Outcome: Progressing 09/27/2020 1053 by Williams Che, RN Outcome: Progressing   Problem: Elimination: Goal: Will not experience complications related to bowel motility Outcome: Progressing   Problem: Pain Managment: Goal: General experience of comfort will improve 09/27/2020 1205 by Williams Che, RN Outcome: Progressing 09/27/2020 1053 by Williams Che, RN Outcome: Progressing   Problem: Safety: Goal: Ability to remain free from injury will improve 09/27/2020 1205 by Williams Che, RN Outcome: Progressing 09/27/2020 1053 by Williams Che, RN Outcome: Progressing   Problem: Skin Integrity: Goal: Risk for impaired skin integrity will decrease 09/27/2020 1205 by Williams Che, RN Outcome: Progressing 09/27/2020 1053 by Williams Che, RN Outcome: Progressing

## 2020-09-27 NOTE — Progress Notes (Signed)
The patient continues to be noncompliant with hospital policy and safety procedures. He refuses to have his bed alarm turned on when the nurse attempts to turn it on.  He is on the phone with family members, complaining  The nursing staff continues to educate him in regards to safety and fall precautions.  The patient appears to listen but does does not comply even after explaining and encouraging.  Bleeding noted from his left BKA (small amount) - staples in place, redness noted to the incision area, edges intact except for the left corner has an opening.  Abd pad applied, kerlix wrapped and ace wrap applied.  The patient is on the phone telling his family member that he is trying to get use to doing for himself and sometimes he does not call out for assistance.

## 2020-09-27 NOTE — Progress Notes (Signed)
Physical Therapy Treatment Patient Details Name: [name] MRN: [id number] DOB: June 13, 1981 Today's Date: 09/27/2020    History of Present Illness Patient is a 39 y/o male with PMH significant for DM Type 1, gastroparesis, HTN, ESRD, chronic L foot diabetic wound infection. Patient admitted for sepsis 2/2 acute osteomyelitis involving L ankle and foot. 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    PT Comments    Patient progressing towards physical therapy goals. Patient ambulated 21' with RW and supervision, patient refuses to be touched with mobility due to "I won't have anybody at home to help me." Anxious about rehab, encouraged about benefits of rehab. Patient continues to be limited by decreased activity tolerance, impaired balance, generalized weakness. Continue to recommend SNF for ongoing Physical Therapy.       Follow Up Recommendations  SNF;Supervision/Assistance - 24 hour     Equipment Recommendations  Rolling Cruze Zingaro with 5" wheels;3in1 (PT);Wheelchair (measurements PT);Wheelchair cushion (measurements PT)    Recommendations for Other Services       Precautions / Restrictions Precautions Precautions: Fall Precaution Comments: Fell evening on 12/23 and morning of 12/24 Restrictions Weight Bearing Restrictions: Yes LLE Weight Bearing: Non weight bearing    Mobility  Bed Mobility Overal bed mobility: Modified Independent                Transfers Overall transfer level: Needs assistance Equipment used: Rolling Brette Cast (2 wheeled) Transfers: Sit to/from Stand Sit to Stand: Supervision         General transfer comment: supervision for safety  Ambulation/Gait Ambulation/Gait assistance: Supervision Gait Distance (Feet): 50 Feet Assistive device: Rolling Avneet Ashmore (2 wheeled) Gait Pattern/deviations: Step-to pattern;Decreased stride length     General Gait Details: supervision for safety, refuses to be touched. No apparent  LOB   Stairs             Wheelchair Mobility    Modified Rankin (Stroke Patients Only)       Balance Overall balance assessment: No apparent balance deficits (not formally assessed)                                          Cognition Arousal/Alertness: Awake/alert Behavior During Therapy: WFL for tasks assessed/performed Overall Cognitive Status: Impaired/Different from baseline Area of Impairment: Attention;Memory;Safety/judgement                   Current Attention Level: Sustained Memory: Decreased short-term memory   Safety/Judgement: Decreased awareness of safety;Decreased awareness of deficits            Exercises      General Comments        Pertinent Vitals/Pain Pain Assessment: Faces Faces Pain Scale: No hurt    Home Living                      Prior Function            PT Goals (current goals can now be found in the care plan section) Acute Rehab PT Goals Patient Stated Goal: to go home PT Goal Formulation: With patient Time For Goal Achievement: 10/11/20 Potential to Achieve Goals: Good Progress towards PT goals: Progressing toward goals    Frequency    Min 3X/week      PT Plan Current plan remains appropriate    Co-evaluation  AM-PAC PT "6 Clicks" Mobility   Outcome Measure  Help needed turning from your back to your side while in a flat bed without using bedrails?: None Help needed moving from lying on your back to sitting on the side of a flat bed without using bedrails?: None Help needed moving to and from a bed to a chair (including a wheelchair)?: A Little Help needed standing up from a chair using your arms (e.g., wheelchair or bedside chair)?: A Little Help needed to walk in hospital room?: A Little Help needed climbing 3-5 steps with a railing? : A Lot 6 Click Score: 19    End of Session   Activity Tolerance: Patient tolerated treatment well Patient left: in  bed;with call bell/phone within reach Nurse Communication: Mobility status PT Visit Diagnosis: Unsteadiness on feet (R26.81);Other abnormalities of gait and mobility (R26.89);Muscle weakness (generalized) (M62.81);Difficulty in walking, not elsewhere classified (R26.2)     Time: 6578-4696 PT Time Calculation (min) (ACUTE ONLY): 13 min  Charges:  $Therapeutic Activity: 8-22 mins                     Perrin Maltese, PT, DPT Acute Rehabilitation Services Pager [phone] Office [phone]    Chesley Noon K Allred 09/27/2020, 1:25 PM

## 2020-09-27 NOTE — Plan of Care (Signed)

## 2020-09-27 NOTE — Plan of Care (Signed)

## 2020-09-27 NOTE — Progress Notes (Signed)
[name]  QPY:[id number] DOB: [birth year] DOA: 09/10/2020 PCP: Kerin Perna, NP    Brief Narrative:  39 year old with a history of ESRD, DM 2, HTN, and a left foot diabetic ulcer who presented to the ED with complaints of pain in the left hand with erythema swelling and warmth of the left foot.  During this hospitalization he has required a left BKA as well as I&D of a left wrist abscess.  Significant Events:  12/14 admit via ED 12/19 surgical I&D left wrist abscess 12/20 left BKA per vascular surgery  Antimicrobials:  Zosyn 12/14 Vancomycin 12/14 >12/23 Cefepime 12/15 >12/21 Ampicillin sulbactam 12/22 > 12/25 Cefazolin 12/26 >  DVT prophylaxis: Subcutaneous heparin  Subjective: Afebrile.  Vital signs stable.  Saturation 90% on room air.  CBG remains quite variable with readings 95-341.  No new physical complaints.  Is very frustrated that he is not able to go to his rehab facility today.  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 -to complete 1 additional week of antibiotics with hemodialysis  Left wrist abscess Evaluated by hand surgery with I&D on 12/19 -MSSA on culture -to complete 1 week additional IV antibiotics during hemodialysis -transition to wet-to-dry dressings per suggestion of hand surgery with significant improvement noted following hydrotherapy  ESRD -M/W/F HD Nephrology following -was to get AVG 12/28 but patient refused surgery -continues with ongoing dialysis   Uncontrolled DM2 Experienced recurring episodes of hypoglycemia when Lantus nightly dosing attempted - remain off Lantus for now at patient's request and follow trends  Anemia of chronic kidney disease Transfused 1 unit PRBC with dialysis -hemoglobin steady  Possible bipolar disorder Exhibiting episodes of aggression psychosis and threatening behavior toward self and staff  during this hospital stay -required transient IVC during this hospital stay -psychiatry was consulted  Disposition difficulty Does not have access to appropriate assistance at home -clinically needs SNF -medically stable for discharge awaiting procurement of SNF bed   Code Status: FULL CODE Family Communication: No family present at time of exam Status is: Inpatient  Remains inpatient appropriate because:IV treatments appropriate due to intensity of illness or inability to take PO and Inpatient level of care appropriate due to severity of illness   Dispo: The patient is from: Home              Anticipated d/c is to: SNF              Anticipated d/c date is: 1 day              Patient currently is medically stable to d/c.  Consultants:  Vascular surgery Nephrology Hand surgery  Objective: Blood pressure 122/90, pulse 88, temperature 98.6 F (37 C), temperature source Oral, resp. rate 14, height 5\' 7"  (1.702 m), weight 67.8 kg, SpO2 98 %.  Intake/Output Summary (Last 24 hours) at 09/27/2020 0936 Last data filed at 09/27/2020 0600 Gross per 24 hour  Intake 531.12 ml  Output 1552 ml  Net -1020.88 ml   Filed Weights   09/24/20 1050 09/26/20 0749 09/26/20 1046  Weight: 67.4 kg 69.3 kg 67.8 kg    Examination: General: No acute respiratory distress Lungs: Clear to auscultation B Cardiovascular: RRR Abdomen: Soft, bowel sounds positive, no rebound Extremities: No significant edema B thighs   CBC: Recent Labs  Lab 09/24/20 0805 09/25/20 0144 09/26/20 0241  WBC 18.0* 14.8* 11.3*  HGB 6.7* 8.4* 9.1*  HCT 23.0* 27.1* 29.4*  MCV 95.8 93.8 92.5  PLT 228 230 712   Basic Metabolic Panel: Recent Labs  Lab 09/22/20 1931 09/23/20 0655 09/24/20 0805 09/26/20 0241  NA 129* 133* 132* 130*  K 4.0 3.8 4.0 4.8  CL 88* 93* 94* 93*  CO2 23 24 25 23   GLUCOSE 288* 239* 83 345*  BUN 59* 27* 34* 28*  CREATININE 8.56* 5.29* 6.66* 6.05*  CALCIUM 7.8* 7.9* 7.9* 8.3*  PHOS 6.3*   --  4.9* 5.4*   GFR: Estimated Creatinine Clearance: 15 mL/min (A) (by C-G formula based on SCr of 6.05 mg/dL (H)).  Liver Function Tests: Recent Labs  Lab 09/21/20 0847 09/22/20 1931 09/24/20 0805 09/26/20 0241  AST 17 12*  --   --   ALT 22 19  --   --   ALKPHOS 187* 194*  --   --   BILITOT 1.7* 0.6  --   --   PROT 6.5 6.6  --   --   ALBUMIN 1.8* 1.8* 1.7* 1.8*    HbA1C: Hemoglobin A1C  Date/Time Value Ref Range Status  08/22/2014 06:28 AM 5.8 4.2 - 6.3 % Final    Comment:    The American Diabetes Association recommends that a primary goal of therapy should be <7% and that physicians should reevaluate the treatment regimen in patients with HbA1c values consistently >8%.   06/28/2014 08:04 PM 7.1 (H) 4.2 - 6.3 % Final    Comment:    The American Diabetes Association recommends that a primary goal of therapy should be <7% and that physicians should reevaluate the treatment regimen in patients with HbA1c values consistently >8%.    Hgb A1c MFr Bld  Date/Time Value Ref Range Status  09/25/2020 02:48 AM 7.3 (H) 4.8 - 5.6 % Final    Comment:    (NOTE) Pre diabetes:          5.7%-6.4%  Diabetes:              >6.4%  Glycemic control for   <7.0% adults with diabetes   07/22/2020 06:56 AM 7.5 (H) 4.8 - 5.6 % Final    Comment:    (NOTE)         Prediabetes: 5.7 - 6.4         Diabetes: >6.4         Glycemic control for adults with diabetes: <7.0     CBG: Recent Labs  Lab 09/26/20 0753 09/26/20 1144 09/26/20 1608 09/26/20 2145 09/27/20 0639  GLUCAP 317* 139* 341* 95 215*    Recent Results (from the past 240 hour(s))  Culture, blood (Routine X 2) w Reflex to ID Panel     Status: None   Collection Time: 09/21/20  8:51 AM   Specimen: BLOOD  Result Value Ref Range Status   Specimen Description BLOOD RIGHT ANTECUBITAL  Final   Special Requests   Final    BOTTLES DRAWN AEROBIC AND ANAEROBIC Blood Culture adequate volume   Culture   Final    NO GROWTH 5  DAYS Performed at [facility], [place] [address] [place], [place] [address]    Report Status 09/26/2020 FINAL  Final  Culture, blood (Routine X 2) w Reflex to ID Panel     Status: None   Collection Time: 09/21/20  8:51 AM   Specimen: BLOOD  Result Value Ref Range Status   Specimen Description BLOOD RIGHT ANTECUBITAL  Final   Special Requests   Final  BOTTLES DRAWN AEROBIC AND ANAEROBIC Blood Culture results may not be optimal due to an inadequate volume of blood received in culture bottles   Culture   Final    NO GROWTH 5 DAYS Performed at [facility], [place] [address] [place], [place] [address]    Report Status 09/26/2020 FINAL  Final     Scheduled Meds: . (feeding supplement) PROSource Plus  30 mL Oral TID BM  . calcitRIOL  2.5 mcg Oral Q M,W,F-HD  . Chlorhexidine Gluconate Cloth  6 each Topical Q0600  . cinacalcet  60 mg Oral Q breakfast  . darbepoetin (ARANESP) injection - DIALYSIS  150 mcg Intravenous Q Fri-HD  . heparin  5,000 Units Subcutaneous Q8H  . heparin sodium (porcine)  1,000 Units Intravenous Q M,W,F-HD  . insulin aspart  0-6 Units Subcutaneous TID WC  . multivitamin  1 tablet Oral QHS  . sevelamer carbonate  4,000 mg Oral TID WC   Continuous Infusions: .  ceFAZolin (ANCEF) IV 1 g (09/26/20 1849)     LOS: 16 days   Cherene Altes, MD [facility] Office  [phone] Pager - Text Page per Amion  If 7PM-7AM, please contact night-coverage per Amion 09/27/2020, 9:36 AM

## 2020-09-27 NOTE — Progress Notes (Signed)
Physical Therapy Wound Treatment Patient Details  Name: [name] MRN: [id number] Date of Birth: 16-Mar-1981  Today's Date: 09/27/2020 Time: 8719-5974 Time Calculation (min): 20 min   **Note, hydrotherapy discontinued 09/26/20.  PT did not see discontinue until after hydrotherapy performed today.  No charge to patient.  RN notified.    Subjective  Subjective: Pleasant and agreeable to hydrotherapy Patient and Family Stated Goals: Heal the wound Date of Onset: 09/15/20 Prior Treatments: surgical I&D 12/19  Pain Score: Pain Score: 5   Wound Assessment  Wound / Incision (Open or Dehisced) 09/21/20 Incision - Open Arm Left;Lower;Medial;Anterior open incision boarded by sutures, with exposed tendon (Active)  Wound Image   09/27/20 1029  Dressing Type Gauze (Comment);Moist to dry;Other (Comment) 09/27/20 1029  Dressing Changed Changed 09/27/20 1029  Dressing Status Clean;Dry;Intact 09/27/20 1029  Dressing Change Frequency Daily 09/27/20 1029  Site / Wound Assessment Pink;Yellow 09/27/20 1029  % Wound base Red or Granulating 75% 09/27/20 1029  % Wound base Yellow/Fibrinous Exudate 5% 09/27/20 1029  % Wound base Black/Eschar 0% 09/27/20 1029  % Wound base Other/Granulation Tissue (Comment) 20% 09/27/20 1029  Peri-wound Assessment Intact 09/27/20 1029  Wound Length (cm) 2.6 cm 09/27/20 1029  Wound Width (cm) 1.3 cm 09/27/20 1029  Wound Surface Area (cm^2) 3.38 cm^2 09/27/20 1029  Undermining (cm) 1 09/27/20 1029  Margins Unattached edges (unapproximated) 09/27/20 1029  Closure Sutures 09/26/20 1400  Drainage Amount Minimal 09/27/20 1029  Drainage Description Serosanguineous 09/27/20 1029  Non-staged Wound Description Full thickness 09/27/20 1029  Treatment Hydrotherapy (Pulse lavage);Packing (Impregnated strip) 09/27/20 1029     Incision (Closed) 09/16/20 Leg Left (Active)  Dressing Type Compression wrap 09/26/20 2000  Dressing Clean;Dry;Intact 09/26/20 2000  Dressing Change  Frequency Twice a day 09/26/20 2000  Site / Wound Assessment Dressing in place / Unable to assess 09/26/20 2000  Margins Unattached edges (unapproximated) 09/26/20 2000  Closure Staples 09/26/20 2000  Drainage Amount None 09/25/20 2125  Drainage Description Serosanguineous;Purulent 09/22/20 0700  Treatment Off loading 09/22/20 2300   Hydrotherapy Pulsed lavage therapy - wound location: L wrist Pulsed Lavage with Suction (psi): 8 psi Pulsed Lavage with Suction - Normal Saline Used: 1000 mL Pulsed Lavage Tip: Tip with splash shield   Wound Assessment and Plan  Wound Therapy - Assess/Plan/Recommendations Wound Therapy - Clinical Statement: Wound with mostly granulation tissue.   Note hydrotherapy discontinued - will sign off Wound Therapy - Functional Problem List: Decreased mobility, decreased wound healing, peripheral arterial disease Factors Delaying/Impairing Wound Healing: Diabetes Mellitus;Vascular compromise;Multiple medical problems Hydrotherapy Plan: Other (comment) (sign off - nursing to perform dressing changes)  Wound Therapy Goals- Improve the function of patient's integumentary system by progressing the wound(s) through the phases of wound healing (inflammation - proliferation - remodeling) by: Decrease Necrotic Tissue to: 0 Decrease Necrotic Tissue - Progress: Partly met Increase Granulation Tissue to: 80 Increase Granulation Tissue - Progress: Partly met Additional Wound Therapy Goal: decrease undermining to <1 cm Additional Wound Therapy Goal - Progress: Not met (decreased to 1 cm)  Goals will be updated until maximal potential achieved or discharge criteria met.  Discharge criteria: when goals achieved, discharge from hospital, MD decision/surgical intervention, no progress towards goals, refusal/missing three consecutive treatments without notification or medical reason.  GP     Shanna Cisco 09/27/2020, 10:38 AM

## 2020-09-27 NOTE — Plan of Care (Signed)
Patient A&Ox4. VS Stable. Complaints of Pain managed with prescribed pain medications. Ambulates with assist using RW. No concerns voiced at present. Patient and mom educated on plan of care.    Problem: Clinical Measurements: Goal: Ability to maintain clinical measurements within normal limits will improve Outcome: Progressing   Problem: Health Behavior/Discharge Planning: Goal: Ability to manage health-related needs will improve Outcome: Progressing

## 2020-09-27 NOTE — Progress Notes (Signed)
Nutrition Follow-up  DOCUMENTATION CODES:   Non-severe (moderate) malnutrition in context of chronic illness  INTERVENTION:   -Continue liberalized diet of regular -Continue renal MVI daily -Continue30 ml Prosource Plus TID, each supplement provides 100 kcals and 15 grams protein  NUTRITION DIAGNOSIS:   Moderate Malnutrition related to chronic illness (ESRD on HD) as evidenced by mild fat depletion,moderate fat depletion,moderate muscle depletion,severe muscle depletion.  Ongoing  GOAL:   Patient will meet greater than or equal to 90% of their needs  Progressing   MONITOR:   PO intake,Supplement acceptance,Labs,Weight trends,Skin,I & O's  REASON FOR ASSESSMENT:   Consult Diet education  ASSESSMENT:   [name] is a 39 y.o. 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  States he has been seeing podiatry about this, Dr. March Rummage.  States wound seems to be worsening and is largere. Now foot has started to become red and warm to the touch with increased pain. Has developed a foul smelling drainage in past few days. He reports subjective fever and intermittent chills the past day. States he has been compliant with his dialysis treatment, due for treatment again tomorrow. He has missed some follow up with podiatry he states. Xray shows osteomyelitis in foot and pt is started on empiric antibiotics in the ER. He has elevated WBC on labs.  12/18- MRI of lt wrist region reveals early abscess vs phlegmon 12/19- s/pPROCEDURE:Incision and drainage left wrist lesion; cultures sent; plan to start hydrotherapy 3-4 days post-op 12/20- s/p PROCEDURE:Left below-knee amputation 12/25- hydrotherapy initiated to lt wrist abscess 12/31- transitioned to wet to dry dressings  Reviewed I/O's:-960 ml x 24 hours and -3.2 L since  admission  Reviewed I/O's: -1 L x 24 hours and -2.1 L since 09/13/20  Pt with last hydrotherapy treatment today. He has been transitioned to wet to dry dressings.   Pt remains with poor oral intake. Noted meal completion 25-50%. Pt continues to accept Prosource Plus supplements. He is resistant to take any other supplements [employer], Nepro, Ensure, etc).   Per TOC notes, pt awaiting SNF bed for discharge.   Medications reviewed and include renvela.   Labs reviewed: CBGS: 95-341 (inpatient orders for glycemic control are none).   Diet Order:   Diet Order            Diet regular Room service appropriate? Yes; Fluid consistency: Thin  Diet effective now                 EDUCATION NEEDS:   Education needs have been addressed  Skin:  Skin Assessment: Skin Integrity Issues: Skin Integrity Issues:: Other (Comment),Incisions Incisions: s/p lt BKA Other: abscess to lt wrist  Last BM:  09/26/20  Height:   Ht Readings from Last 1 Encounters:  09/10/20 5\' 7"  (1.702 m)    Weight:   Wt Readings from Last 1 Encounters:  09/26/20 67.8 kg    Ideal Body Weight:  62.9 kg (adjusted for lt BKA)  BMI:  Body mass index is 23.41 kg/m.  Estimated Nutritional Needs:   Kcal:  2050-2250  Protein:  105-120 grams  Fluid:  1000 ml + UOP    Loistine Chance, RD, LDN, [place] Registered Dietitian II Certified Diabetes Care and Education Specialist Please refer to [facility] for RD and/or RD on-call/weekend/after hours pager

## 2020-09-28 DIAGNOSIS — F4320 Adjustment disorder, unspecified: Secondary | ICD-10-CM | POA: Diagnosis not present

## 2020-09-28 DIAGNOSIS — Z992 Dependence on renal dialysis: Secondary | ICD-10-CM | POA: Diagnosis not present

## 2020-09-28 DIAGNOSIS — N186 End stage renal disease: Secondary | ICD-10-CM | POA: Diagnosis not present

## 2020-09-28 DIAGNOSIS — M86172 Other acute osteomyelitis, left ankle and foot: Secondary | ICD-10-CM | POA: Diagnosis not present

## 2020-09-28 LAB — RENAL FUNCTION PANEL
Albumin: 1.8 g/dL — ABNORMAL LOW (ref 3.5–5.0)
Anion gap: 13 (ref 5–15)
BUN: 31 mg/dL — ABNORMAL HIGH (ref 6–20)
CO2: 24 mmol/L (ref 22–32)
Calcium: 8.2 mg/dL — ABNORMAL LOW (ref 8.9–10.3)
Chloride: 93 mmol/L — ABNORMAL LOW (ref 98–111)
Creatinine, Ser: 6.45 mg/dL — ABNORMAL HIGH (ref 0.61–1.24)
GFR, Estimated: 10 mL/min — ABNORMAL LOW (ref >60)
Glucose, Bld: 409 mg/dL — ABNORMAL HIGH (ref 70–99)
Phosphorus: 4.4 mg/dL (ref 2.5–4.6)
Potassium: 5.4 mmol/L — ABNORMAL HIGH (ref 3.5–5.1)
Sodium: 130 mmol/L — ABNORMAL LOW (ref 135–145)

## 2020-09-28 LAB — CBC
HCT: 27.9 % — ABNORMAL LOW (ref 39.0–52.0)
Hemoglobin: 8 g/dL — ABNORMAL LOW (ref 13.0–17.0)
MCH: 27.9 pg (ref 26.0–34.0)
MCHC: 28.7 g/dL — ABNORMAL LOW (ref 30.0–36.0)
MCV: 97.2 fL (ref 80.0–100.0)
Platelets: 296 10*3/uL (ref 150–400)
RBC: 2.87 MIL/uL — ABNORMAL LOW (ref 4.22–5.81)
RDW: 16.2 % — ABNORMAL HIGH (ref 11.5–15.5)
WBC: 10.8 10*3/uL — ABNORMAL HIGH (ref 4.0–10.5)
nRBC: 0 % (ref 0.0–0.2)

## 2020-09-28 LAB — GLUCOSE, CAPILLARY
Glucose-Capillary: 226 mg/dL — ABNORMAL HIGH (ref 70–99)
Glucose-Capillary: 251 mg/dL — ABNORMAL HIGH (ref 70–99)
Glucose-Capillary: 264 mg/dL — ABNORMAL HIGH (ref 70–99)

## 2020-09-28 MED ORDER — DARBEPOETIN ALFA 150 MCG/0.3ML IJ SOSY
PREFILLED_SYRINGE | INTRAMUSCULAR | Status: AC
  Administered 2020-09-28: 150 ug via INTRAVENOUS
  Filled 2020-09-28: qty 0.3

## 2020-09-28 MED ORDER — DARBEPOETIN ALFA 150 MCG/0.3ML IJ SOSY
150.0000 ug | PREFILLED_SYRINGE | INTRAMUSCULAR | Status: DC
  Administered 2020-10-04 – 2020-10-11 (×2): 150 ug via INTRAVENOUS
  Filled 2020-09-28 (×3): qty 0.3

## 2020-09-28 MED ORDER — HEPARIN SODIUM (PORCINE) 1000 UNIT/ML IJ SOLN
INTRAMUSCULAR | Status: AC
  Administered 2020-09-28: 1000 [IU]
  Filled 2020-09-28: qty 4

## 2020-09-28 NOTE — Progress Notes (Signed)
POSEIDON PAM  HEN:[id number] DOB: 17-Jul-1981 DOA: 09/10/2020 PCP: Kerin Perna, NP    Brief Narrative:  40 year old with a history of ESRD, DM 2, HTN, and a left foot diabetic ulcer who presented to the ED with complaints of pain in the left hand with erythema and swelling as well as warmth of the left foot.  During this hospitalization he has required a left BKA as well as I&D of a left wrist abscess.  Significant Events:  12/14 admit via ED 12/19 surgical I&D left wrist abscess 12/20 left BKA per vascular surgery  Antimicrobials:  Zosyn 12/14 Vancomycin 12/14 >12/23 Cefepime 12/15 >12/21 Ampicillin sulbactam 12/22 > 12/25 Cefazolin 12/26 >  DVT prophylaxis: Subcutaneous heparin  Subjective: Afebrile.  Vital signs stable.  Saturation 97% on room air.  Medically stable and awaiting placement within a SNF for a rehab stay.  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 -to complete 1 additional week of antibiotics with hemodialysis - wound care per [facility]VS  Left wrist abscess Evaluated by hand surgery with I&D on 12/19 -MSSA on culture -to complete 1 week additional IV antibiotics during hemodialysis -transition to wet-to-dry dressings per suggestion of hand surgery with significant improvement noted following hydrotherapy  ESRD -M/W/F HD Nephrology following -was to get AVG 12/28 but patient refused surgery -continues with ongoing dialysis -signed off dialysis earlier today  Uncontrolled DM2 Experienced recurring episodes of hypoglycemia when Lantus nightly dosing attempted - remain off Lantus for now at patient's request and follow trends -CBG reasonably well controlled at this time  Anemia of chronic kidney disease Transfused 1 unit PRBC with dialysis -hemoglobin steady -future transfusions at discretion of nephrology during dialysis treatments  Possible  bipolar disorder Exhibiting episodes of aggression psychosis and threatening behavior toward self and staff during this hospital stay -required transient IVC during this hospital stay -psychiatry was consulted  Disposition difficulty Does not have access to appropriate assistance at home -clinically needs SNF -medically stable for discharge awaiting procurement of SNF bed   Code Status: FULL CODE Family Communication: No family present at time of exam Status is: Inpatient  Remains inpatient appropriate because:Unsafe d/c plan   Dispo: The patient is from: Home              Anticipated d/c is to: SNF              Anticipated d/c date is: 1 day              Patient currently is medically stable to d/c.  Consultants:  Vascular surgery Nephrology Hand surgery  Objective: Blood pressure (!) 145/89, pulse 88, temperature 97.6 F (36.4 C), temperature source Oral, resp. rate 18, height 5\' 7"  (1.702 m), weight 67.8 kg, SpO2 97 %. No intake or output data in the 24 hours ending 09/28/20 0942 Filed Weights   09/24/20 1050 09/26/20 0749 09/26/20 1046  Weight: 67.4 kg 69.3 kg 67.8 kg    Examination: General: No acute respiratory distress Lungs: Clear to auscultation B - no wheeze  Cardiovascular: RRR  CBC: Recent Labs  Lab 09/25/20 0144 09/26/20 0241 09/28/20 0902  WBC 14.8* 11.3* 10.8*  HGB 8.4* 9.1* 8.0*  HCT 27.1* 29.4* 27.9*  MCV 93.8 92.5 97.2  PLT 230 248 361   Basic Metabolic Panel: Recent Labs  Lab 09/22/20 1931 09/23/20 0655 09/24/20 0805 09/26/20 0241  NA 129* 133* 132* 130*  K 4.0 3.8 4.0 4.8  CL 88* 93* 94* 93*  CO2 23 24 25 23   GLUCOSE 288* 239* 83 345*  BUN 59* 27* 34* 28*  CREATININE 8.56* 5.29* 6.66* 6.05*  CALCIUM 7.8* 7.9* 7.9* 8.3*  PHOS 6.3*  --  4.9* 5.4*   GFR: Estimated Creatinine Clearance: 15 mL/min (A) (by C-G formula based on SCr of 6.05 mg/dL (H)).  Liver Function Tests: Recent Labs  Lab 09/22/20 1931 09/24/20 0805  09/26/20 0241  AST 12*  --   --   ALT 19  --   --   ALKPHOS 194*  --   --   BILITOT 0.6  --   --   PROT 6.6  --   --   ALBUMIN 1.8* 1.7* 1.8*    HbA1C: Hemoglobin A1C  Date/Time Value Ref Range Status  08/22/2014 06:28 AM 5.8 4.2 - 6.3 % Final    Comment:    The American Diabetes Association recommends that a primary goal of therapy should be <7% and that physicians should reevaluate the treatment regimen in patients with HbA1c values consistently >8%.   06/28/2014 08:04 PM 7.1 (H) 4.2 - 6.3 % Final    Comment:    The American Diabetes Association recommends that a primary goal of therapy should be <7% and that physicians should reevaluate the treatment regimen in patients with HbA1c values consistently >8%.    Hgb A1c MFr Bld  Date/Time Value Ref Range Status  09/25/2020 02:48 AM 7.3 (H) 4.8 - 5.6 % Final    Comment:    (NOTE) Pre diabetes:          5.7%-6.4%  Diabetes:              >6.4%  Glycemic control for   <7.0% adults with diabetes   07/22/2020 06:56 AM 7.5 (H) 4.8 - 5.6 % Final    Comment:    (NOTE)         Prediabetes: 5.7 - 6.4         Diabetes: >6.4         Glycemic control for adults with diabetes: <7.0     CBG: Recent Labs  Lab 09/26/20 1608 09/26/20 2145 09/27/20 0639 09/27/20 1651 09/27/20 2024  GLUCAP 341* 95 215* 179* 148*    Recent Results (from the past 240 hour(s))  Culture, blood (Routine X 2) w Reflex to ID Panel     Status: None   Collection Time: 09/21/20  8:51 AM   Specimen: BLOOD  Result Value Ref Range Status   Specimen Description BLOOD RIGHT ANTECUBITAL  Final   Special Requests   Final    BOTTLES DRAWN AEROBIC AND ANAEROBIC Blood Culture adequate volume   Culture   Final    NO GROWTH 5 DAYS Performed at [facility], [address], [place], [place] [address]    Report Status 09/26/2020 FINAL  Final  Culture, blood (Routine X 2) w Reflex to ID Panel     Status: None   Collection Time: 09/21/20  8:51 AM    Specimen: BLOOD  Result Value Ref Range Status   Specimen Description BLOOD RIGHT ANTECUBITAL  Final   Special Requests   Final    BOTTLES DRAWN AEROBIC AND ANAEROBIC Blood Culture results may not be optimal due to an inadequate volume of blood received in culture bottles   Culture   Final    NO GROWTH 5 DAYS Performed at [facility], [place]  [address], [place], [place] [address]    Report Status 09/26/2020 FINAL  Final     Scheduled Meds: . (feeding supplement) PROSource Plus  30 mL Oral TID BM  . calcitRIOL  2.5 mcg Oral Q M,W,F-HD  . Chlorhexidine Gluconate Cloth  6 each Topical Q0600  . cinacalcet  60 mg Oral Q breakfast  . darbepoetin (ARANESP) injection - DIALYSIS  150 mcg Intravenous Q Sat-HD  . heparin  5,000 Units Subcutaneous Q8H  . heparin sodium (porcine)  1,000 Units Intravenous Q M,W,F-HD  . insulin aspart  0-6 Units Subcutaneous TID WC  . multivitamin  1 tablet Oral QHS  . sevelamer carbonate  4,000 mg Oral TID WC   Continuous Infusions: .  ceFAZolin (ANCEF) IV 1 g (09/27/20 1740)     LOS: 17 days   Cherene Altes, MD [facility] Office  [phone] Pager - Text Page per Amion  If 7PM-7AM, please contact night-coverage per Amion 09/28/2020, 9:42 AM

## 2020-09-28 NOTE — Progress Notes (Signed)
[facility] Progress Note   Subjective:   Seen on HD. Reports feeling well this AM, no SOB, CP, dizziness, nausea or vomiting. Asking for derm consult for itching lesions on abdomen/arms, reports they are chronic and that he was told they are related to his kidneys.   Objective Vitals:   09/27/20 1633 09/27/20 2010 09/28/20 0320 09/28/20 0810  BP: (!) 130/95 (!) 124/91 132/68 (!) 163/109  Pulse: 93 90 81 87  Resp: 16 18 18 17   Temp: 98 F (36.7 C) 98.1 F (36.7 C) 98.2 F (36.8 C) 97.7 F (36.5 C)  TempSrc: Oral Oral Oral Oral  SpO2: 98% 99% 98% 96%  Weight:      Height:       Physical Exam General: Well developed male, alert and in NAD Heart: RRR, no murmurs, rubs or gallops Lungs: CTA bilaterally without wheezing, rhonchi or rales Abdomen: Soft, non-tender, non-distended, +BS Extremities: No edema b/l lower extremities, L BKA Dialysis Access: R IJ TDC Skin: Small papules in linear configuration on upper abdomen, + excoriations, no erythema/drainage  Additional Objective Labs: Basic Metabolic Panel: Recent Labs  Lab 09/22/20 1931 09/23/20 0655 09/24/20 0805 09/26/20 0241  NA 129* 133* 132* 130*  K 4.0 3.8 4.0 4.8  CL 88* 93* 94* 93*  CO2 23 24 25 23   GLUCOSE 288* 239* 83 345*  BUN 59* 27* 34* 28*  CREATININE 8.56* 5.29* 6.66* 6.05*  CALCIUM 7.8* 7.9* 7.9* 8.3*  PHOS 6.3*  --  4.9* 5.4*   Liver Function Tests: Recent Labs  Lab 09/21/20 0847 09/22/20 1931 09/24/20 0805 09/26/20 0241  AST 17 12*  --   --   ALT 22 19  --   --   ALKPHOS 187* 194*  --   --   BILITOT 1.7* 0.6  --   --   PROT 6.5 6.6  --   --   ALBUMIN 1.8* 1.8* 1.7* 1.8*   CBC: Recent Labs  Lab 09/22/20 1931 09/23/20 0655 09/24/20 0805 09/25/20 0144 09/26/20 0241  WBC 20.6* 20.7* 18.0* 14.8* 11.3*  HGB 6.9* 7.0* 6.7* 8.4* 9.1*  HCT 23.1* 23.7* 23.0* 27.1* 29.4*  MCV 93.1 95.2 95.8 93.8 92.5  PLT 225 217 228 230 248   Blood Culture    Component Value Date/Time    SDES BLOOD RIGHT ANTECUBITAL 09/21/2020 0851   SDES BLOOD RIGHT ANTECUBITAL 09/21/2020 0851   SPECREQUEST  09/21/2020 0851    BOTTLES DRAWN AEROBIC AND ANAEROBIC Blood Culture adequate volume   SPECREQUEST  09/21/2020 0851    BOTTLES DRAWN AEROBIC AND ANAEROBIC Blood Culture results may not be optimal due to an inadequate volume of blood received in culture bottles   CULT  09/21/2020 0851    NO GROWTH 5 DAYS Performed at [facility], [address] [place], [place] [address]    CULT  09/21/2020 [phone]    NO GROWTH 5 DAYS Performed at [facility] [address], [place], [place] [address]    REPTSTATUS 09/26/2020 FINAL 09/21/2020 0851   REPTSTATUS 09/26/2020 FINAL 09/21/2020 0851   CBG: Recent Labs  Lab 09/26/20 1608 09/26/20 2145 09/27/20 0639 09/27/20 1651 09/27/20 2024  GLUCAP 341* 95 215* 179* 148*   Medications: .  ceFAZolin (ANCEF) IV 1 g (09/27/20 1740)   . (feeding supplement) PROSource Plus  30 mL Oral TID BM  . calcitRIOL  2.5 mcg Oral Q M,W,F-HD  . Chlorhexidine Gluconate Cloth  6 each Topical Q0600  . cinacalcet  60 mg Oral Q breakfast  . darbepoetin (ARANESP) injection - DIALYSIS  150 mcg Intravenous Q Sat-HD  . heparin  5,000 Units Subcutaneous Q8H  . heparin sodium (porcine)  1,000 Units Intravenous Q M,W,F-HD  . insulin aspart  0-6 Units Subcutaneous TID WC  . multivitamin  1 tablet Oral QHS  . sevelamer carbonate  4,000 mg Oral TID WC    Dialysis Orders: MWF @ [facility] 4:15hr, 2K/2Ca, 400/800, EDW 67kg, TDC, heparin 5000 + 5000 mid-run bolus - Calcitriol 46mcg PO q HD - No ESA ** Scheduled for R BC AVF on 09/24/20, now delayed per patient request.  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 On Cefazolin. Ongoing pain issues. VVS following, small area of wound dehissence. 2. ESRD:Usual MWF schedule. Intermittent refusal this week, ended up having HD Tuesday,Thursday, Saturday. Resume MWF schedule on Monday.   3.HTN/volume:BPhigh and mild hyponatremia, follow with UF. 4. Anemia:S/p1U PRBCs on 12/27. Hgb improved to9.1. Aranesp dose increased. 5. Secondary hyperparathyroidism:CorrCa/Phos good- continue binders/VDRA/sensipar. 6. Nutrition:Alb low - continue protein supplements. 7. Bipolar disorder + underlying personality disorder:Severe aggression last week - improved over past few days. Psych consulted on 12/24 - did not meet criteria for inpatient psych. 8.L wrist abscess: S/p I&D 12/19 - will need 1 more week abx per notes. 9. T2DM: Chronically uncontrolled. 10: Itching/papules: Chronic per pt. Pruritis can be due to chronically high phosphorus/uremia. If persistent, outpatient dermatology consult may be appropriate 11. Dispo: Plan is d/c to SNF/rehab - pending.   Anice Paganini, PA-C 09/28/2020, 8:32 AM  [facility] Pager: [phone]

## 2020-09-28 NOTE — Progress Notes (Signed)
[facility]  Patient signed off early from HD today with 2 hours remaining. Reported he could not tolerate HD any longer due to "IBS" type symptoms.

## 2020-09-28 NOTE — TOC Progression Note (Signed)
Transition of Care [facility]) - Progression Note    Patient Details  Name: [name] MRN: [id number] Date of Birth: July 20, 1981  Transition of Care [facility]) CM/SW [place], [place] Phone Number: 09/28/2020, 12:09 PM  Clinical Narrative:    CSW followed up with [facility] concerning insurance auth.  Facility does not have insurance auth as of today.  TOC Team will continue to assist with disposition planning.   Expected Discharge Plan: Home/Self Care Barriers to Discharge: Continued Medical Work up  Expected Discharge Plan and Services Expected Discharge Plan: Home/Self Care   Discharge Planning Services: CM Consult     Expected Discharge Date: 09/28/20                                     Social Determinants of Health (SDOH) Interventions    Readmission Risk Interventions No flowsheet data found.

## 2020-09-28 NOTE — Plan of Care (Signed)

## 2020-09-29 DIAGNOSIS — M86172 Other acute osteomyelitis, left ankle and foot: Secondary | ICD-10-CM | POA: Diagnosis not present

## 2020-09-29 DIAGNOSIS — Z992 Dependence on renal dialysis: Secondary | ICD-10-CM | POA: Diagnosis not present

## 2020-09-29 DIAGNOSIS — N186 End stage renal disease: Secondary | ICD-10-CM | POA: Diagnosis not present

## 2020-09-29 DIAGNOSIS — F4320 Adjustment disorder, unspecified: Secondary | ICD-10-CM | POA: Diagnosis not present

## 2020-09-29 LAB — GLUCOSE, CAPILLARY
Glucose-Capillary: 259 mg/dL — ABNORMAL HIGH (ref 70–99)
Glucose-Capillary: 188 mg/dL — ABNORMAL HIGH (ref 70–99)
Glucose-Capillary: 160 mg/dL — ABNORMAL HIGH (ref 70–99)
Glucose-Capillary: 232 mg/dL — ABNORMAL HIGH (ref 70–99)

## 2020-09-29 MED ORDER — CHLORHEXIDINE GLUCONATE CLOTH 2 % EX PADS
6.0000 | MEDICATED_PAD | Freq: Every day | CUTANEOUS | Status: DC
  Administered 2020-09-30 – 2020-10-09 (×6): 6 via TOPICAL

## 2020-09-29 MED ORDER — OXYCODONE HCL 5 MG PO TABS
5.0000 mg | ORAL_TABLET | ORAL | Status: DC | PRN
  Administered 2020-09-29: 5 mg via ORAL
  Administered 2020-09-29 – 2020-10-02 (×9): 10 mg via ORAL
  Filled 2020-09-29 (×8): qty 2

## 2020-09-29 NOTE — Progress Notes (Signed)
[name]  RCB:[id number] DOB: 14-Jun-1981 DOA: 09/10/2020 PCP: Kerin Perna, NP    Brief Narrative:  40 year old with a history of ESRD, DM 2, HTN, and a left foot diabetic ulcer who presented to the ED with complaints of pain in the left hand with erythema and swelling as well as warmth of the left foot.  During this hospitalization he has required a left BKA as well as I&D of a left wrist abscess.  Significant Events:  12/14 admit via ED 12/19 surgical I&D left wrist abscess 12/20 left BKA per vascular surgery  Antimicrobials:  Zosyn 12/14 Vancomycin 12/14 >12/23 Cefepime 12/15 >12/21 Ampicillin sulbactam 12/22 > 12/25 Cefazolin 12/26 >  DVT prophylaxis: Subcutaneous heparin  Subjective: Afebrile.  Medically stable.  Awaiting SNF bed for discharge.  Reports development of phantom limb pain early this morning which is not responded to his current pain medication regimen.  Denies shortness of breath or chest pain.  Moving bowels.  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 -completed 1 additional week of antibiotics - wound care per VVS  Left wrist abscess Evaluated by hand surgery with I&D on 12/19 -MSSA on culture - transitioned to wet-to-dry dressings per suggestion of hand surgery with significant improvement noted following hydrotherapy  ESRD -M/W/F HD Nephrology following -was to get AVG 12/28 but patient refused surgery -continues with ongoing dialysis   Uncontrolled DM2 Experienced recurring episodes of hypoglycemia when Lantus nightly dosing attempted - remain off Lantus for now at patient's request and follow trend  Anemia of chronic kidney disease Transfused 1 unit PRBC with dialysis -hemoglobin steady -future transfusions at discretion of nephrology during dialysis treatments  Possible bipolar disorder Exhibited intermittent episodes of  aggression psychosis and threatening behavior toward self and staff during this hospital stay -required transient IVC during this hospital stay -psychiatry was consulted - was not a candidate for inpt tx   Disposition difficulty Does not have access to appropriate assistance at home -clinically needs SNF -medically stable for discharge awaiting procurement of SNF bed   Code Status: FULL CODE Family Communication: No family present at time of exam Status is: Inpatient  Remains inpatient appropriate because:Unsafe d/c plan   Dispo: The patient is from: Home              Anticipated d/c is to: SNF              Anticipated d/c date is: 1 day              Patient currently is medically stable to d/c.  Consultants:  Vascular surgery Nephrology Hand surgery  Objective: Blood pressure (!) 177/108, pulse 82, temperature 98.8 F (37.1 C), temperature source Oral, resp. rate 18, height 5\' 7"  (1.702 m), weight 70 kg, SpO2 100 %.  Intake/Output Summary (Last 24 hours) at 09/29/2020 1004 Last data filed at 09/28/2020 1035 Gross per 24 hour  Intake --  Output 600 ml  Net -600 ml   Filed Weights   09/26/20 1046 09/28/20 0825 09/28/20 1035  Weight: 67.8 kg 70.6 kg 70 kg    Examination: General: No acute respiratory distress Lungs: no wheezing  Cardiovascular: RRR  CBC: Recent Labs  Lab 09/25/20 0144 09/26/20 0241 09/28/20 0902  WBC 14.8* 11.3* 10.8*  HGB 8.4* 9.1* 8.0*  HCT 27.1* 29.4* 27.9*  MCV 93.8 92.5 97.2  PLT 230 248 803   Basic Metabolic  Panel: Recent Labs  Lab 09/24/20 0805 09/26/20 0241 09/28/20 0905  NA 132* 130* 130*  K 4.0 4.8 5.4*  CL 94* 93* 93*  CO2 25 23 24   GLUCOSE 83 345* 409*  BUN 34* 28* 31*  CREATININE 6.66* 6.05* 6.45*  CALCIUM 7.9* 8.3* 8.2*  PHOS 4.9* 5.4* 4.4   GFR: Estimated Creatinine Clearance: 14.1 mL/min (A) (by C-G formula based on SCr of 6.45 mg/dL (H)).  Liver Function Tests: Recent Labs  Lab 09/22/20 1931 09/24/20 0805  09/26/20 0241 09/28/20 0905  AST 12*  --   --   --   ALT 19  --   --   --   ALKPHOS 194*  --   --   --   BILITOT 0.6  --   --   --   PROT 6.6  --   --   --   ALBUMIN 1.8* 1.7* 1.8* 1.8*    HbA1C: Hemoglobin A1C  Date/Time Value Ref Range Status  08/22/2014 06:28 AM 5.8 4.2 - 6.3 % Final    Comment:    The American Diabetes Association recommends that a primary goal of therapy should be <7% and that physicians should reevaluate the treatment regimen in patients with HbA1c values consistently >8%.   06/28/2014 08:04 PM 7.1 (H) 4.2 - 6.3 % Final    Comment:    The American Diabetes Association recommends that a primary goal of therapy should be <7% and that physicians should reevaluate the treatment regimen in patients with HbA1c values consistently >8%.    Hgb A1c MFr Bld  Date/Time Value Ref Range Status  09/25/2020 02:48 AM 7.3 (H) 4.8 - 5.6 % Final    Comment:    (NOTE) Pre diabetes:          5.7%-6.4%  Diabetes:              >6.4%  Glycemic control for   <7.0% adults with diabetes   07/22/2020 06:56 AM 7.5 (H) 4.8 - 5.6 % Final    Comment:    (NOTE)         Prediabetes: 5.7 - 6.4         Diabetes: >6.4         Glycemic control for adults with diabetes: <7.0     CBG: Recent Labs  Lab 09/27/20 2024 09/28/20 1132 09/28/20 1851 09/28/20 2107 09/29/20 0950  GLUCAP 148* 226* 251* 264* 259*    Recent Results (from the past 240 hour(s))  Culture, blood (Routine X 2) w Reflex to ID Panel     Status: None   Collection Time: 09/21/20  8:51 AM   Specimen: BLOOD  Result Value Ref Range Status   Specimen Description BLOOD RIGHT ANTECUBITAL  Final   Special Requests   Final    BOTTLES DRAWN AEROBIC AND ANAEROBIC Blood Culture adequate volume   Culture   Final    NO GROWTH 5 DAYS Performed at [facility], [place] [address] [place],  [address]    Report Status 09/26/2020 FINAL  Final  Culture, blood (Routine X 2) w Reflex to ID Panel     Status:  None   Collection Time: 09/21/20  8:51 AM   Specimen: BLOOD  Result Value Ref Range Status   Specimen Description BLOOD RIGHT ANTECUBITAL  Final   Special Requests   Final    BOTTLES DRAWN AEROBIC AND ANAEROBIC Blood Culture results may not be optimal due to an inadequate volume of blood received  in culture bottles   Culture   Final    NO GROWTH 5 DAYS Performed at [facility], [place] [address], [place], [place] [address]    Report Status 09/26/2020 FINAL  Final     Scheduled Meds: . (feeding supplement) PROSource Plus  30 mL Oral TID BM  . calcitRIOL  2.5 mcg Oral Q M,W,F-HD  . Chlorhexidine Gluconate Cloth  6 each Topical Q0600  . cinacalcet  60 mg Oral Q breakfast  . [START ON 10/04/2020] darbepoetin (ARANESP) injection - DIALYSIS  150 mcg Intravenous Q Fri-HD  . heparin  5,000 Units Subcutaneous Q8H  . heparin sodium (porcine)  1,000 Units Intravenous Q M,W,F-HD  . insulin aspart  0-6 Units Subcutaneous TID WC  . multivitamin  1 tablet Oral QHS  . sevelamer carbonate  4,000 mg Oral TID WC   Continuous Infusions: .  ceFAZolin (ANCEF) IV 1 g (09/28/20 1858)     LOS: 18 days   Cherene Altes, MD [facility] Office  [phone] Pager - Text Page per Amion  If 7PM-7AM, please contact night-coverage per Amion 09/29/2020, 10:04 AM

## 2020-09-29 NOTE — Progress Notes (Addendum)
The dietary manager spoke with this RN about pt. He said that when dietary staff when to drop off pt's tray that pt was cursing at him and verbally threatening him. Pt has received first breakfast tray and became very upset because they only brought two milks and two pancakes instead of 4. So pt refused to eat first try and called and ordered another tray. When they dropped off the second tray of breakfast is when pt began cursing at staff. Dietary staff feel threatened by pt so they will no longer be delivering trays to pt's room. It is now on nursing staff to bring trays into pt's room.  Pt noncompliant with fall precautions. Refuses low bed and to have bed alarm on. Calls intermittently for bathroom assistance. Educated on importance of calling for assistance but he states that he has to be able to do this himself.

## 2020-09-29 NOTE — Progress Notes (Signed)
[facility] Progress Note   Subjective:   Seen in room, no new c/o.    Objective Vitals:   09/28/20 1035 09/28/20 1844 09/29/20 0539 09/29/20 0847  BP: (!) 157/96 (!) 161/80 140/85 (!) 177/108  Pulse: 85 91 83 82  Resp: 16  18 18   Temp: 97.6 F (36.4 C) 98.8 F (37.1 C) 98.4 F (36.9 C)   TempSrc: Oral Oral Oral   SpO2: 97% 96% 98% 100%  Weight: 70 kg     Height:       Physical Exam General: Well developed male, alert and in NAD Heart: RRR, no murmurs, rubs or gallops Lungs: CTA bilaterally without wheezing, rhonchi or rales Abdomen: Soft, non-tender, non-distended, +BS Extremities: No edema b/l lower extremities, L BKA Dialysis Access: R IJ [facility] Neuro: nonfocal, Ox 3  Additional Objective Labs: Basic Metabolic Panel: Recent Labs  Lab 09/24/20 0805 09/26/20 0241 09/28/20 0905  NA 132* 130* 130*  K 4.0 4.8 5.4*  CL 94* 93* 93*  CO2 25 23 24   GLUCOSE 83 345* 409*  BUN 34* 28* 31*  CREATININE 6.66* 6.05* 6.45*  CALCIUM 7.9* 8.3* 8.2*  PHOS 4.9* 5.4* 4.4   Liver Function Tests: Recent Labs  Lab 09/22/20 1931 09/24/20 0805 09/26/20 0241 09/28/20 0905  AST 12*  --   --   --   ALT 19  --   --   --   ALKPHOS 194*  --   --   --   BILITOT 0.6  --   --   --   PROT 6.6  --   --   --   ALBUMIN 1.8* 1.7* 1.8* 1.8*   CBC: Recent Labs  Lab 09/23/20 0655 09/24/20 0805 09/25/20 0144 09/26/20 0241 09/28/20 0902  WBC 20.7* 18.0* 14.8* 11.3* 10.8*  HGB 7.0* 6.7* 8.4* 9.1* 8.0*  HCT 23.7* 23.0* 27.1* 29.4* 27.9*  MCV 95.2 95.8 93.8 92.5 97.2  PLT 217 228 230 248 296   Blood Culture    Component Value Date/Time   SDES BLOOD RIGHT ANTECUBITAL 09/21/2020 0851   SDES BLOOD RIGHT ANTECUBITAL 09/21/2020 0851   SPECREQUEST  09/21/2020 0851    BOTTLES DRAWN AEROBIC AND ANAEROBIC Blood Culture adequate volume   SPECREQUEST  09/21/2020 0851    BOTTLES DRAWN AEROBIC AND ANAEROBIC Blood Culture results may not be optimal due to an inadequate volume of  blood received in culture bottles   CULT  09/21/2020 0851    NO GROWTH 5 DAYS Performed at [facility], [address], [place], [place] [address]    CULT  09/21/2020 [id number]    NO GROWTH 5 DAYS Performed at [facility] [address], [place], [place] [address]    REPTSTATUS 09/26/2020 FINAL 09/21/2020 0851   REPTSTATUS 09/26/2020 FINAL 09/21/2020 0851   CBG: Recent Labs  Lab 09/28/20 1132 09/28/20 1851 09/28/20 2107 09/29/20 0950 09/29/20 1154  GLUCAP 226* 251* 264* 259* 188*   Medications:  . (feeding supplement) PROSource Plus  30 mL Oral TID BM  . calcitRIOL  2.5 mcg Oral Q M,W,F-HD  . Chlorhexidine Gluconate Cloth  6 each Topical Q0600  . [START ON 09/30/2020] Chlorhexidine Gluconate Cloth  6 each Topical Q0600  . cinacalcet  60 mg Oral Q breakfast  . [START ON 10/04/2020] darbepoetin (ARANESP) injection - DIALYSIS  150 mcg Intravenous Q Fri-HD  . heparin  5,000 Units Subcutaneous Q8H  . heparin sodium (porcine)  1,000 Units Intravenous Q M,W,F-HD  .  insulin aspart  0-6 Units Subcutaneous TID WC  . multivitamin  1 tablet Oral QHS  . sevelamer carbonate  4,000 mg Oral TID WC    OP HD: MWF @ [facility]  4h 81min  400/800   67kg  TDC   Hep 5000 + 5045midrun  - Calcitriol 57mcg PO q HD - No ESA ** Scheduled for R BC AVF on 09/24/20, now delayed per patient request.  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 On Cefazolin. Ongoing pain issues. VVS following, small area of wound dehissence. 2. ESRD:Usual MWF schedule. Intermittent refusal this week, ended up having HD Tuesday,Thursday, Saturday. Resume MWF schedule w/ HD tomorrow.  3.HTN/volume:BPhigh and mild hyponatremia, up 3kg . UF same w hd 4. Anemia:S/p1U PRBCs on 12/27. Hgb improved to9.1. Aranesp dose increased.  5. Secondary hyperparathyroidism:CorrCa/Phos good- continue binders/VDRA/sensipar. 6. Nutrition:Alb low - continue protein supplements. 7. Bipolar disorder +  underlying personality disorder:Severe aggression last week - improved over past few days. Psych consulted on 12/24 - did not meet criteria for inpatient psych. 8.L wrist abscess: S/p I&D 12/19 - will need 1 more week abx per notes. 9. T2DM: Chronically uncontrolled. 10: Itching/papules: Chronic per pt. Pruritis can be due to chronically high phosphorus/uremia. If persistent, outpatient dermatology consult may be appropriate 11. Dispo: Plan is d/c to SNF/rehab - pending.   Kelly Splinter, MD 09/29/2020, 3:40 PM

## 2020-09-30 DIAGNOSIS — N186 End stage renal disease: Secondary | ICD-10-CM | POA: Diagnosis not present

## 2020-09-30 DIAGNOSIS — F4320 Adjustment disorder, unspecified: Secondary | ICD-10-CM | POA: Diagnosis not present

## 2020-09-30 DIAGNOSIS — Z992 Dependence on renal dialysis: Secondary | ICD-10-CM | POA: Diagnosis not present

## 2020-09-30 DIAGNOSIS — M86172 Other acute osteomyelitis, left ankle and foot: Secondary | ICD-10-CM | POA: Diagnosis not present

## 2020-09-30 LAB — GLUCOSE, CAPILLARY
Glucose-Capillary: 243 mg/dL — ABNORMAL HIGH (ref 70–99)
Glucose-Capillary: 407 mg/dL — ABNORMAL HIGH (ref 70–99)
Glucose-Capillary: 183 mg/dL — ABNORMAL HIGH (ref 70–99)

## 2020-09-30 LAB — RENAL FUNCTION PANEL
Albumin: 1.7 g/dL — ABNORMAL LOW (ref 3.5–5.0)
Anion gap: 9 (ref 5–15)
BUN: 37 mg/dL — ABNORMAL HIGH (ref 6–20)
CO2: 26 mmol/L (ref 22–32)
Calcium: 7.7 mg/dL — ABNORMAL LOW (ref 8.9–10.3)
Chloride: 97 mmol/L — ABNORMAL LOW (ref 98–111)
Creatinine, Ser: 6.64 mg/dL — ABNORMAL HIGH (ref 0.61–1.24)
GFR, Estimated: 10 mL/min — ABNORMAL LOW (ref >60)
Glucose, Bld: 299 mg/dL — ABNORMAL HIGH (ref 70–99)
Phosphorus: 3.5 mg/dL (ref 2.5–4.6)
Potassium: 5.9 mmol/L — ABNORMAL HIGH (ref 3.5–5.1)
Sodium: 132 mmol/L — ABNORMAL LOW (ref 135–145)

## 2020-09-30 LAB — CBC
HCT: 24.9 % — ABNORMAL LOW (ref 39.0–52.0)
Hemoglobin: 7.5 g/dL — ABNORMAL LOW (ref 13.0–17.0)
MCH: 29.3 pg (ref 26.0–34.0)
MCHC: 30.1 g/dL (ref 30.0–36.0)
MCV: 97.3 fL (ref 80.0–100.0)
Platelets: 288 10*3/uL (ref 150–400)
RBC: 2.56 MIL/uL — ABNORMAL LOW (ref 4.22–5.81)
RDW: 15.8 % — ABNORMAL HIGH (ref 11.5–15.5)
WBC: 11.6 10*3/uL — ABNORMAL HIGH (ref 4.0–10.5)
nRBC: 0 % (ref 0.0–0.2)

## 2020-09-30 MED ORDER — HEPARIN SODIUM (PORCINE) 1000 UNIT/ML DIALYSIS: 20.0000 [IU]/kg | INTRAMUSCULAR | Status: DC | PRN

## 2020-09-30 MED ORDER — OXYCODONE HCL 5 MG PO TABS
ORAL_TABLET | ORAL | Status: AC
  Administered 2020-09-30: 10 mg via ORAL
  Filled 2020-09-30: qty 2

## 2020-09-30 MED ORDER — HEPARIN SODIUM (PORCINE) 1000 UNIT/ML IJ SOLN
INTRAMUSCULAR | Status: AC
  Filled 2020-09-30: qty 2

## 2020-09-30 MED ORDER — CALCITRIOL 0.5 MCG PO CAPS
ORAL_CAPSULE | ORAL | Status: AC
  Administered 2020-09-30: 2.5 ug via ORAL
  Filled 2020-09-30: qty 5

## 2020-09-30 NOTE — Progress Notes (Signed)
PT Cancellation Note  Patient Details Name: [name] MRN: [id number] DOB: 1981/08/19   Cancelled Treatment:    Reason Eval/Treat Not Completed: (P) Patient at procedure or test/unavailable (HD, will f/u per POC.)   [name] Eli Hose 09/30/2020, 5:35 PM  Erasmo Leventhal , PTA Acute Rehabilitation Services Pager [phone] Office [phone]

## 2020-09-30 NOTE — TOC Progression Note (Signed)
Transition of Care [facility]) - Progression Note    Patient Details  Name: [name] MRN: [id number] Date of Birth: [birth year]  [facility]) CM/SW [place], [place] Phone Number: 09/30/2020, 10:38 AM  Clinical Narrative:    Case management spoke with Terri Piedra, renal navigator, about switching the patient to an HD facility in [place] while he is pending insurance authorization for admission to [facility] [place].  I spoke with [name], CM at [facility] and they have started insurance authorization for admission but do not have approval yet due to the holiday weekend and closure of insurance companies over the holiday.   Expected Discharge Plan: Home/Self Care Barriers to Discharge: Continued Medical Work up  Expected Discharge Plan and Services Expected Discharge Plan: Home/Self Care   Discharge Planning Services: CM Consult     Expected Discharge Date: 09/28/20                                     Social Determinants of Health (SDOH) Interventions    Readmission Risk Interventions No flowsheet data found.

## 2020-09-30 NOTE — Plan of Care (Signed)

## 2020-09-30 NOTE — Progress Notes (Signed)
Pt. System clotted pt. Refused to be reconnected to complete treatment. Veneta Penton, PA aware. Pt. stable

## 2020-09-30 NOTE — Progress Notes (Signed)
Renal Navigator read documentation that patient has been offered a bed at [facility], which mother has accepted. Patient will need HD clinic switched to [facility] to accommodate this rehab center. Navigator called CM to discuss and has requested a temporary transfer to [facility] to accommodate discharge plan.  Navigator will follow closely.   Alphonzo Cruise, [place] Renal Navigator [phone]

## 2020-09-30 NOTE — Progress Notes (Signed)
Inpatient Diabetes Program Recommendations  AACE/ADA: New Consensus Statement on Inpatient Glycemic Control  Target Ranges:  Prepandial:   less than 140 mg/dL      Peak postprandial:   less than 180 mg/dL (1-2 hours)      Critically ill patients:  140 - 180 mg/dL   Results for MERRITT, MCCRAVY (MRN [id number]) as of 09/30/2020 14:01  Ref. Range 09/29/2020 09:50 09/29/2020 11:54 09/29/2020 18:08 09/29/2020 22:02 09/30/2020 11:29  Glucose-Capillary Latest Ref Range: 70 - 99 mg/dL 259 (H) 188 (H) 160 (H) 232 (H) 243 (H)   Review of Glycemic Control  Diabetes history: DM Outpatient Diabetes medications: Latus 6 units QHS, Novolog 6 units BID with meals Current orders for Inpatient glycemic control: Novolog 0-6 units TID with meals  Inpatient Diabetes Program Recommendations:    Diet: If appropriate, please consider discontinuing Regular diet and ordering Carb Modified Renal diet.  Insulin: If regular diet is continued, may need very low dose Novolog meal coverage if patient is eating at least 50% of meals.  Thanks, Barnie Alderman, RN, MSN, CDE Diabetes Coordinator Inpatient Diabetes Program [phone] (Team Pager from 8am to 5pm)

## 2020-09-30 NOTE — Progress Notes (Signed)
[name]  XNA:[id number] DOB: 07/12/1981 DOA: 09/10/2020 PCP: Kerin Perna, NP    Brief Narrative:  40 year old with a history of ESRD, DM 2, HTN, and a left foot diabetic ulcer who presented to the ED with complaints of pain in the left hand with erythema and swelling as well as warmth of the left foot.  During this hospitalization he has required a left BKA as well as I&D of a left wrist abscess.  Significant Events:  12/14 admit via ED 12/19 surgical I&D left wrist abscess 12/20 left BKA per vascular surgery  Antimicrobials:  Zosyn 12/14 Vancomycin 12/14 >12/23 Cefepime 12/15 >12/21 Ampicillin sulbactam 12/22 > 12/25 Cefazolin 12/26 >  DVT prophylaxis: Subcutaneous heparin  Subjective: Patient left his hemodialysis treatment earlier again today. Afebrile.  Resting comfortably in bed.  No new complaints today.  States he is ravenous post dialysis.  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 -completed 1 additional week of antibiotics - wound care per VVS  Left wrist abscess Evaluated by hand surgery with I&D on 12/19 -MSSA on culture - transitioned to wet-to-dry dressings per suggestion of hand surgery with significant improvement noted following hydrotherapy  ESRD -M/W/F HD Nephrology following -was to get AVG 12/28 but patient refused surgery -continues with ongoing dialysis and frequently leave treatment early  Uncontrolled DM2 Experienced recurring episodes of hypoglycemia when Lantus nightly dosing attempted - remain off Lantus for now at patient's request and follow trend  Anemia of chronic kidney disease Transfused 1 unit PRBC with dialysis -hemoglobin steady -future transfusions at discretion of nephrology during dialysis treatments  Possible bipolar disorder Exhibited intermittent episodes of aggression psychosis and threatening behavior  toward self and staff during this hospital stay -required transient IVC during this hospital stay -psychiatry was consulted - was not a candidate for inpt tx   Disposition difficulty Does not have access to appropriate assistance at home -clinically needs SNF -medically stable for discharge awaiting procurement of SNF bed   Code Status: FULL CODE Family Communication: No family present at time of exam Status is: Inpatient  Remains inpatient appropriate because:Unsafe d/c plan   Dispo: The patient is from: Home              Anticipated d/c is to: SNF              Anticipated d/c date is: 1 day              Patient currently is medically stable to d/c.  Consultants:  Vascular surgery Nephrology Hand surgery  Objective: Blood pressure (!) 157/98, pulse 89, temperature 98.1 F (36.7 C), temperature source Oral, resp. rate 17, height 5\' 7"  (1.702 m), weight 73.8 kg, SpO2 100 %.  Intake/Output Summary (Last 24 hours) at 09/30/2020 1137 Last data filed at 09/30/2020 1037 Gross per 24 hour  Intake 480 ml  Output 3610 ml  Net -3130 ml   Filed Weights   09/28/20 1035 09/30/20 0715 09/30/20 1100  Weight: 70 kg 77.2 kg 73.8 kg    Examination: General: No acute respiratory distress Lungs: no wheezing  Cardiovascular: RRR  CBC: Recent Labs  Lab 09/26/20 0241 09/28/20 0902 09/30/20 0938  WBC 11.3* 10.8* 11.6*  HGB 9.1* 8.0* 7.5*  HCT 29.4* 27.9* 24.9*  MCV 92.5 97.2 97.3  PLT 248 296 542   Basic Metabolic Panel: Recent Labs  Lab 09/26/20 0241 09/28/20 0905 09/30/20  0939  NA 130* 130* 132*  K 4.8 5.4* 5.9*  CL 93* 93* 97*  CO2 23 24 26   GLUCOSE 345* 409* 299*  BUN 28* 31* 37*  CREATININE 6.05* 6.45* 6.64*  CALCIUM 8.3* 8.2* 7.7*  PHOS 5.4* 4.4 3.5   GFR: Estimated Creatinine Clearance: 13.7 mL/min (A) (by C-G formula based on SCr of 6.64 mg/dL (H)).  Liver Function Tests: Recent Labs  Lab 09/24/20 0805 09/26/20 0241 09/28/20 0905 09/30/20 0939  ALBUMIN  1.7* 1.8* 1.8* 1.7*    HbA1C: Hemoglobin A1C  Date/Time Value Ref Range Status  08/22/2014 06:28 AM 5.8 4.2 - 6.3 % Final    Comment:    The American Diabetes Association recommends that a primary goal of therapy should be <7% and that physicians should reevaluate the treatment regimen in patients with HbA1c values consistently >8%.   06/28/2014 08:04 PM 7.1 (H) 4.2 - 6.3 % Final    Comment:    The American Diabetes Association recommends that a primary goal of therapy should be <7% and that physicians should reevaluate the treatment regimen in patients with HbA1c values consistently >8%.    Hgb A1c MFr Bld  Date/Time Value Ref Range Status  09/25/2020 02:48 AM 7.3 (H) 4.8 - 5.6 % Final    Comment:    (NOTE) Pre diabetes:          5.7%-6.4%  Diabetes:              >6.4%  Glycemic control for   <7.0% adults with diabetes   07/22/2020 06:56 AM 7.5 (H) 4.8 - 5.6 % Final    Comment:    (NOTE)         Prediabetes: 5.7 - 6.4         Diabetes: >6.4         Glycemic control for adults with diabetes: <7.0     CBG: Recent Labs  Lab 09/29/20 0950 09/29/20 1154 09/29/20 1808 09/29/20 2202 09/30/20 1129  GLUCAP 259* 188* 160* 232* 243*    Recent Results (from the past 240 hour(s))  Culture, blood (Routine X 2) w Reflex to ID Panel     Status: None   Collection Time: 09/21/20  8:51 AM   Specimen: BLOOD  Result Value Ref Range Status   Specimen Description BLOOD RIGHT ANTECUBITAL  Final   Special Requests   Final    BOTTLES DRAWN AEROBIC AND ANAEROBIC Blood Culture adequate volume   Culture   Final    NO GROWTH 5 DAYS Performed at [facility], [place] [address], [place], [place] [address]    Report Status 09/26/2020 FINAL  Final  Culture, blood (Routine X 2) w Reflex to ID Panel     Status: None   Collection Time: 09/21/20  8:51 AM   Specimen: BLOOD  Result Value Ref Range Status   Specimen Description BLOOD RIGHT ANTECUBITAL  Final   Special Requests   Final     BOTTLES DRAWN AEROBIC AND ANAEROBIC Blood Culture results may not be optimal due to an inadequate volume of blood received in culture bottles   Culture   Final    NO GROWTH 5 DAYS Performed at [facility], [place] [address] [place], [place] [address]    Report Status 09/26/2020 FINAL  Final     Scheduled Meds: . (feeding supplement) PROSource Plus  30 mL Oral TID BM  . calcitRIOL  2.5 mcg Oral Q M,W,F-HD  . Chlorhexidine Gluconate Cloth  6 each Topical Q0600  . Chlorhexidine Gluconate Cloth  6 each Topical Q0600  . cinacalcet  60 mg Oral Q breakfast  . [START ON 10/04/2020] darbepoetin (ARANESP) injection - DIALYSIS  150 mcg Intravenous Q Fri-HD  . heparin  5,000 Units Subcutaneous Q8H  . heparin sodium (porcine)      . heparin sodium (porcine)  1,000 Units Intravenous Q M,W,F-HD  . insulin aspart  0-6 Units Subcutaneous TID WC  . multivitamin  1 tablet Oral QHS  . sevelamer carbonate  4,000 mg Oral TID WC     LOS: 19 days   Cherene Altes, MD [facility] Office  [phone] Pager - Text Page per Amion  If 7PM-7AM, please contact night-coverage per Amion 09/30/2020, 11:37 AM

## 2020-09-30 NOTE — Procedures (Signed)
I was present at this dialysis session. I have reviewed the session itself and made appropriate changes.   Sig vol up by bed/standing weights. UF goal 5L.  AM labs pending.     Filed Weights   09/28/20 0825 09/28/20 1035 09/30/20 0715  Weight: 70.6 kg 70 kg (P) 77.2 kg    Recent Labs  Lab 09/28/20 0905  NA 130*  K 5.4*  CL 93*  CO2 24  GLUCOSE 409*  BUN 31*  CREATININE 6.45*  CALCIUM 8.2*  PHOS 4.4    Recent Labs  Lab 09/25/20 0144 09/26/20 0241 09/28/20 0902  WBC 14.8* 11.3* 10.8*  HGB 8.4* 9.1* 8.0*  HCT 27.1* 29.4* 27.9*  MCV 93.8 92.5 97.2  PLT 230 248 296    Scheduled Meds: . (feeding supplement) PROSource Plus  30 mL Oral TID BM  . calcitRIOL  2.5 mcg Oral Q M,W,F-HD  . Chlorhexidine Gluconate Cloth  6 each Topical Q0600  . Chlorhexidine Gluconate Cloth  6 each Topical Q0600  . cinacalcet  60 mg Oral Q breakfast  . [START ON 10/04/2020] darbepoetin (ARANESP) injection - DIALYSIS  150 mcg Intravenous Q Fri-HD  . heparin  5,000 Units Subcutaneous Q8H  . heparin sodium (porcine)      . heparin sodium (porcine)  1,000 Units Intravenous Q M,W,F-HD  . insulin aspart  0-6 Units Subcutaneous TID WC  . multivitamin  1 tablet Oral QHS  . sevelamer carbonate  4,000 mg Oral TID WC   Continuous Infusions: PRN Meds:.albuterol, haloperidol **OR** haloperidol lactate, heparin, HYDROmorphone (DILAUDID) injection, oxyCODONE, sevelamer carbonate   Pearson Grippe  MD 09/30/2020, 8:24 AM

## 2020-10-01 DIAGNOSIS — F4320 Adjustment disorder, unspecified: Secondary | ICD-10-CM | POA: Diagnosis not present

## 2020-10-01 DIAGNOSIS — Z992 Dependence on renal dialysis: Secondary | ICD-10-CM | POA: Diagnosis not present

## 2020-10-01 DIAGNOSIS — N186 End stage renal disease: Secondary | ICD-10-CM | POA: Diagnosis not present

## 2020-10-01 DIAGNOSIS — M86172 Other acute osteomyelitis, left ankle and foot: Secondary | ICD-10-CM | POA: Diagnosis not present

## 2020-10-01 LAB — GLUCOSE, CAPILLARY
Glucose-Capillary: 288 mg/dL — ABNORMAL HIGH (ref 70–99)
Glucose-Capillary: 229 mg/dL — ABNORMAL HIGH (ref 70–99)
Glucose-Capillary: 155 mg/dL — ABNORMAL HIGH (ref 70–99)
Glucose-Capillary: 68 mg/dL — ABNORMAL LOW (ref 70–99)

## 2020-10-01 LAB — OCCULT BLOOD X 1 CARD TO LAB, STOOL: Fecal Occult Bld: NEGATIVE

## 2020-10-01 NOTE — Progress Notes (Signed)
Navigator received message from Admissions Coordinator from [facility] in [place] who states Navigator should be looking for HD seat in [place], not [place], as they no longer have a contract for transportation to [place]. Navigator explained that patient is a [employer] patient and there is not a [facility] clinic in [place]. Navigator was not informed until now that [facility] no longer transported to [place], and would reach out to the [facility] in [place] to see about a transient seat request to accommodate patient's discharge to SNF in [place].  Navigator contacted [facility] in [place] prior to completing formal referral to save time (ensure there was a seat before going through entire transfer process). Navigator was informed that they cannot accommodate patient at this time as they do not have any seats available.  Navigator contacted Admissions Coordinator again and informed that [facility] HD clinic cannot accept transient request. She contacted Administrator, who has agreed to transport to [place], but now states there is not currently a male bed available today. Navigator is uncertain when there may be a bed available and will defer this conversation to [facility] team.  Navigator has reached back out to [facility] to re-request a seat. Navigator will continue to follow.  [name], [facility] Navigator [phone]

## 2020-10-01 NOTE — Plan of Care (Signed)
  Problem: Health Behavior/Discharge Planning: Goal: Ability to manage health-related needs will improve Outcome: Progressing   Problem: Activity: Goal: Risk for activity intolerance will decrease Outcome: Progressing   

## 2020-10-01 NOTE — Care Management Important Message (Deleted)
Important Message  Patient Details  Name: [name] MRN: [id number] Date of Birth: 1980-12-16   Medicare Important Message Given:  Yes     Palin Tristan P Jaysa Kise 10/01/2020, 2:50 PM

## 2020-10-01 NOTE — Progress Notes (Signed)
[facility] Progress Note   Subjective:  Seen in room - no CP or dyspnea. Per notes, has secured a SNF/rehab but requires HD center temporary change which is still pending. Did ok with HD yesterday - 3.6L removed.  Objective Vitals:   09/30/20 1131 09/30/20 1640 09/30/20 1900 10/01/20 0917  BP: (!) 157/98 (!) 138/91 (!) 143/93 (!) 152/78  Pulse: 89 96 92 84  Resp: 17 17 17 16   Temp: 98.1 F (36.7 C) 97.8 F (36.6 C) 97.8 F (36.6 C)   TempSrc: Oral Oral Oral   SpO2: 100% 100% 98% 100%  Weight:      Height:       Physical Exam General: Well appearing man, NAD. Room air. Heart: RRR, no murmur Lungs: CTA bilaterally without wheezing, rhonchi or rales Abdomen: Soft, non-tender Extremities: No RLE edema, L stump bandaged without edema Dialysis Access: R [facility]  Additional Objective Labs: Basic Metabolic Panel: Recent Labs  Lab 09/26/20 0241 09/28/20 0905 09/30/20 0939  NA 130* 130* 132*  K 4.8 5.4* 5.9*  CL 93* 93* 97*  CO2 23 24 26   GLUCOSE 345* 409* 299*  BUN 28* 31* 37*  CREATININE 6.05* 6.45* 6.64*  CALCIUM 8.3* 8.2* 7.7*  PHOS 5.4* 4.4 3.5   Liver Function Tests: Recent Labs  Lab 09/26/20 0241 09/28/20 0905 09/30/20 0939  ALBUMIN 1.8* 1.8* 1.7*   CBC: Recent Labs  Lab 09/25/20 0144 09/26/20 0241 09/28/20 0902 09/30/20 0938  WBC 14.8* 11.3* 10.8* 11.6*  HGB 8.4* 9.1* 8.0* 7.5*  HCT 27.1* 29.4* 27.9* 24.9*  MCV 93.8 92.5 97.2 97.3  PLT 230 248 296 288   Medications:  . (feeding supplement) PROSource Plus  30 mL Oral TID BM  . calcitRIOL  2.5 mcg Oral Q M,W,F-HD  . Chlorhexidine Gluconate Cloth  6 each Topical Q0600  . Chlorhexidine Gluconate Cloth  6 each Topical Q0600  . cinacalcet  60 mg Oral Q breakfast  . [START ON 10/04/2020] darbepoetin (ARANESP) injection - DIALYSIS  150 mcg Intravenous Q Fri-HD  . heparin  5,000 Units Subcutaneous Q8H  . heparin sodium (porcine)  1,000 Units Intravenous Q M,W,F-HD  . insulin aspart  0-6  Units Subcutaneous TID WC  . multivitamin  1 tablet Oral QHS  . sevelamer carbonate  4,000 mg Oral TID WC    Dialysis Orders: MWF @ [facility] 4h 5min  400/800   67kg  TDC   Hep 5000 + 5000 midrun - Calcitriol 36mcg PO q HD - No ESA ** Scheduled for R BC AVF on 09/24/20, now delayed per patient request.  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 Finished Cefazolin. Had small area os wound dehiscence, but healing fine per notes.  2. ESRD:Usual MWF schedule - next HD 1/5. 3.HTN/volume:BPhigh, up per weights, ^ UF goal as tolerated. 4. Anemia:S/p1U PRBCs on 12/27 and Aranesp dose increased. Hgb improved to 9.1 - down falling again. Will check FOBT. 5. Secondary hyperparathyroidism:CorrCa/Phos good- continue binders/VDRA/sensipar. 6. Nutrition:Alb low - continue protein supplements. 7. Bipolar disorder + underlying personality disorder:Severe aggression earlier this admit - improved slowly. Psych consulted on 12/24 - did not meet criteria for inpatient psych. 8.L wrist abscess: S/p I&D 12/19 - now s/p antibiotic course. 9. T2DM: Chronically uncontrolled. 10: Itching/papules: Chronic per pt. Pruritis can be due to chronically high phosphorus/uremia. If persistent, outpatient dermatology consult may be appropriate 11. Dispo: Plan is d/c to SNF/rehab - pending.   Veneta Penton, PA-C 10/01/2020, 10:28 AM  [name]

## 2020-10-01 NOTE — Plan of Care (Signed)

## 2020-10-01 NOTE — Plan of Care (Signed)
  Problem: Clinical Measurements: Goal: Ability to maintain clinical measurements within normal limits will improve Outcome: Adequate for Discharge Goal: Will remain free from infection Outcome: Adequate for Discharge Goal: Diagnostic test results will improve Outcome: Adequate for Discharge

## 2020-10-01 NOTE — Progress Notes (Signed)
PT Cancellation Note  Patient Details Name: [name] MRN: [id number] DOB: [birth year]   Cancelled Treatment:    Reason Eval/Treat Not Completed: (P) Other (comment) (pt having meeting with unit director and dietary staff, not available when attempted.) Will continue attempts per PT POC as schedule permits.   [name] Elion Hocker 10/01/2020, 4:46 PM

## 2020-10-01 NOTE — Progress Notes (Signed)
[name]  JKD:[id number] DOB: 1981/06/23 DOA: 09/10/2020 PCP: Kerin Perna, NP    Brief Narrative:  40 year old with a history of ESRD, DM 2, HTN, and a left foot diabetic ulcer who presented to the ED with complaints of pain in the left hand with erythema and swelling as well as warmth of the left foot.  During this hospitalization he has required a left BKA as well as I&D of a left wrist abscess.  Significant Events:  12/14 admit via ED 12/19 surgical I&D left wrist abscess 12/20 left BKA per vascular surgery  Antimicrobials:  Zosyn 12/14 Vancomycin 12/14 >12/23 Cefepime 12/15 >12/21 Ampicillin sulbactam 12/22 > 12/25 Cefazolin 12/26 >  DVT prophylaxis: Subcutaneous heparin  Subjective: Remains medically stable.  Is awaiting SNF placement for a rehab stay. Very frustrated today about a piece of jewelry reportedly lost during a prior ED visit.   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 -completed 1 additional week of antibiotics - wound care per VVS  Left wrist abscess Evaluated by hand surgery with I&D on 12/19 -MSSA on culture - transitioned to wet-to-dry dressings per suggestion of hand surgery with significant improvement noted following hydrotherapy  ESRD -M/W/F HD Nephrology following -was to get AVG 12/28 but patient refused surgery -continues with ongoing dialysis and frequently leave treatment early  Uncontrolled DM2 Experienced recurring episodes of hypoglycemia when Lantus nightly dosing attempted - remain off Lantus for now at patient's request and follow trend -CBG remains quite labile but patient prefers hyperglycemia out of fear of severe hypoglycemia  Anemia of chronic kidney disease Transfused 1 unit PRBC with dialysis -hemoglobin steady -future transfusions at discretion of nephrology during dialysis treatments  Possible bipolar  disorder Exhibited intermittent episodes of aggression psychosis and threatening behavior toward self and staff during this hospital stay -required transient IVC during this hospital stay -psychiatry was consulted - was not a candidate for inpt tx   Disposition difficulty Does not have access to appropriate assistance at home -clinically needs SNF -medically stable for discharge awaiting procurement of SNF bed   Code Status: FULL CODE Family Communication: No family present at time of exam Status is: Inpatient  Remains inpatient appropriate because:Unsafe d/c plan   Dispo: The patient is from: Home              Anticipated d/c is to: SNF              Anticipated d/c date is: 1 day              Patient currently is medically stable to d/c.  Consultants:  Vascular surgery Nephrology Hand surgery  Objective: Blood pressure (!) 152/78, pulse 84, temperature 97.8 F (36.6 C), temperature source Oral, resp. rate 16, height 5\' 7"  (1.702 m), weight 73.8 kg, SpO2 100 %.  Intake/Output Summary (Last 24 hours) at 10/01/2020 1216 Last data filed at 09/30/2020 1300 Gross per 24 hour  Intake 240 ml  Output 1 ml  Net 239 ml   Filed Weights   09/28/20 1035 09/30/20 0715 09/30/20 1100  Weight: 70 kg 77.2 kg 73.8 kg    Examination: General: No acute respiratory distress Lungs: no wheezing  Cardiovascular: RRR  CBC: Recent Labs  Lab 09/26/20 0241 09/28/20 0902 09/30/20 0938  WBC 11.3* 10.8* 11.6*  HGB 9.1* 8.0* 7.5*  HCT 29.4* 27.9* 24.9*  MCV 92.5 97.2 97.3  PLT 248  296 229   Basic Metabolic Panel: Recent Labs  Lab 09/26/20 0241 09/28/20 0905 09/30/20 0939  NA 130* 130* 132*  K 4.8 5.4* 5.9*  CL 93* 93* 97*  CO2 23 24 26   GLUCOSE 345* 409* 299*  BUN 28* 31* 37*  CREATININE 6.05* 6.45* 6.64*  CALCIUM 8.3* 8.2* 7.7*  PHOS 5.4* 4.4 3.5   GFR: Estimated Creatinine Clearance: 13.7 mL/min (A) (by C-G formula based on SCr of 6.64 mg/dL (H)).  Liver Function  Tests: Recent Labs  Lab 09/26/20 0241 09/28/20 0905 09/30/20 0939  ALBUMIN 1.8* 1.8* 1.7*    HbA1C: Hemoglobin A1C  Date/Time Value Ref Range Status  08/22/2014 06:28 AM 5.8 4.2 - 6.3 % Final    Comment:    The American Diabetes Association recommends that a primary goal of therapy should be <7% and that physicians should reevaluate the treatment regimen in patients with HbA1c values consistently >8%.   06/28/2014 08:04 PM 7.1 (H) 4.2 - 6.3 % Final    Comment:    The American Diabetes Association recommends that a primary goal of therapy should be <7% and that physicians should reevaluate the treatment regimen in patients with HbA1c values consistently >8%.    Hgb A1c MFr Bld  Date/Time Value Ref Range Status  09/25/2020 02:48 AM 7.3 (H) 4.8 - 5.6 % Final    Comment:    (NOTE) Pre diabetes:          5.7%-6.4%  Diabetes:              >6.4%  Glycemic control for   <7.0% adults with diabetes   07/22/2020 06:56 AM 7.5 (H) 4.8 - 5.6 % Final    Comment:    (NOTE)         Prediabetes: 5.7 - 6.4         Diabetes: >6.4         Glycemic control for adults with diabetes: <7.0     CBG: Recent Labs  Lab 09/30/20 1129 09/30/20 1636 09/30/20 2114 10/01/20 0913 10/01/20 1120  GLUCAP 243* 407* 183* 288* 229*    No results found for this or any previous visit (from the past 240 hour(s)).   Scheduled Meds: . (feeding supplement) PROSource Plus  30 mL Oral TID BM  . calcitRIOL  2.5 mcg Oral Q M,W,F-HD  . Chlorhexidine Gluconate Cloth  6 each Topical Q0600  . Chlorhexidine Gluconate Cloth  6 each Topical Q0600  . cinacalcet  60 mg Oral Q breakfast  . [START ON 10/04/2020] darbepoetin (ARANESP) injection - DIALYSIS  150 mcg Intravenous Q Fri-HD  . heparin  5,000 Units Subcutaneous Q8H  . insulin aspart  0-6 Units Subcutaneous TID WC  . multivitamin  1 tablet Oral QHS  . sevelamer carbonate  4,000 mg Oral TID WC     LOS: 20 days   Cherene Altes, MD [facility] Office  [phone] Pager - Text Page per Amion  If 7PM-7AM, please contact night-coverage per Amion 10/01/2020, 12:16 PM

## 2020-10-02 DIAGNOSIS — F4320 Adjustment disorder, unspecified: Secondary | ICD-10-CM | POA: Diagnosis not present

## 2020-10-02 DIAGNOSIS — M86172 Other acute osteomyelitis, left ankle and foot: Secondary | ICD-10-CM | POA: Diagnosis not present

## 2020-10-02 DIAGNOSIS — E08621 Diabetes mellitus due to underlying condition with foot ulcer: Secondary | ICD-10-CM | POA: Diagnosis not present

## 2020-10-02 DIAGNOSIS — N186 End stage renal disease: Secondary | ICD-10-CM | POA: Diagnosis not present

## 2020-10-02 LAB — RENAL FUNCTION PANEL
Albumin: 1.9 g/dL — ABNORMAL LOW (ref 3.5–5.0)
Anion gap: 9 (ref 5–15)
BUN: 39 mg/dL — ABNORMAL HIGH (ref 6–20)
CO2: 28 mmol/L (ref 22–32)
Calcium: 8.6 mg/dL — ABNORMAL LOW (ref 8.9–10.3)
Chloride: 96 mmol/L — ABNORMAL LOW (ref 98–111)
Creatinine, Ser: 6.63 mg/dL — ABNORMAL HIGH (ref 0.61–1.24)
GFR, Estimated: 10 mL/min — ABNORMAL LOW (ref >60)
Glucose, Bld: 152 mg/dL — ABNORMAL HIGH (ref 70–99)
Phosphorus: 3.9 mg/dL (ref 2.5–4.6)
Potassium: 6.2 mmol/L — ABNORMAL HIGH (ref 3.5–5.1)
Sodium: 133 mmol/L — ABNORMAL LOW (ref 135–145)

## 2020-10-02 LAB — CBC
HCT: 23.8 % — ABNORMAL LOW (ref 39.0–52.0)
Hemoglobin: 7.2 g/dL — ABNORMAL LOW (ref 13.0–17.0)
MCH: 29.5 pg (ref 26.0–34.0)
MCHC: 30.3 g/dL (ref 30.0–36.0)
MCV: 97.5 fL (ref 80.0–100.0)
Platelets: 301 10*3/uL (ref 150–400)
RBC: 2.44 MIL/uL — ABNORMAL LOW (ref 4.22–5.81)
RDW: 16 % — ABNORMAL HIGH (ref 11.5–15.5)
WBC: 10.1 10*3/uL (ref 4.0–10.5)
nRBC: 0 % (ref 0.0–0.2)

## 2020-10-02 LAB — GLUCOSE, CAPILLARY
Glucose-Capillary: 146 mg/dL — ABNORMAL HIGH (ref 70–99)
Glucose-Capillary: 240 mg/dL — ABNORMAL HIGH (ref 70–99)
Glucose-Capillary: 285 mg/dL — ABNORMAL HIGH (ref 70–99)
Glucose-Capillary: 75 mg/dL (ref 70–99)

## 2020-10-02 MED ORDER — HYDROMORPHONE HCL 1 MG/ML IJ SOLN
0.5000 mg | INTRAMUSCULAR | Status: DC | PRN
  Administered 2020-10-02 – 2020-10-04 (×17): 0.5 mg via INTRAVENOUS
  Filled 2020-10-02 (×17): qty 0.5

## 2020-10-02 MED ORDER — HEPARIN SODIUM (PORCINE) 1000 UNIT/ML DIALYSIS
5000.0000 [IU] | Freq: Once | INTRAMUSCULAR | Status: AC
  Filled 2020-10-02: qty 5

## 2020-10-02 MED ORDER — OXYCODONE HCL 5 MG PO TABS
5.0000 mg | ORAL_TABLET | ORAL | Status: DC | PRN
  Administered 2020-10-02 – 2020-10-04 (×8): 10 mg via ORAL
  Filled 2020-10-02 (×8): qty 2

## 2020-10-02 MED ORDER — HYDROMORPHONE HCL 1 MG/ML IJ SOLN: 0.5000 mg | INTRAMUSCULAR | Status: DC | PRN

## 2020-10-02 MED ORDER — OXYCODONE HCL 5 MG PO TABS
ORAL_TABLET | ORAL | Status: AC
  Administered 2020-10-02: 10 mg via ORAL
  Filled 2020-10-02: qty 2

## 2020-10-02 MED ORDER — HYDROMORPHONE HCL 1 MG/ML IJ SOLN
INTRAMUSCULAR | Status: AC
  Administered 2020-10-02: 1 mg via INTRAVENOUS
  Filled 2020-10-02: qty 1

## 2020-10-02 MED ORDER — HEPARIN SODIUM (PORCINE) 1000 UNIT/ML IJ SOLN
INTRAMUSCULAR | Status: AC
  Administered 2020-10-02: 5000 [IU] via INTRAVENOUS_CENTRAL
  Filled 2020-10-02: qty 5

## 2020-10-02 MED ORDER — OXYCODONE HCL 5 MG PO TABS
5.0000 mg | ORAL_TABLET | ORAL | Status: DC | PRN
Start: 2020-10-02 — End: 2020-10-02

## 2020-10-02 MED ORDER — CALCITRIOL 0.5 MCG PO CAPS
ORAL_CAPSULE | ORAL | Status: AC
  Administered 2020-10-02: 2.5 ug via ORAL
  Filled 2020-10-02: qty 5

## 2020-10-02 NOTE — Progress Notes (Signed)
Physical Therapy Treatment Patient Details Name: [name] MRN: [id number] DOB: Aug 31, 1981 Today's Date: 10/02/2020    History of Present Illness Patient is a 40 y/o male with PMH significant for DM Type 1, gastroparesis, HTN, ESRD, chronic L foot diabetic wound infection. Patient admitted for sepsis 2/2 acute osteomyelitis involving L ankle and foot. 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    PT Comments    Patient ambulated x35' and x10' with RW and supervision, balance improving. Educated patient on phantom limb pain/sensations due to patient experiencing pain at night. Educated patient about purpose of rehab and benefits of going to rehab. Patient continues to be limited by decreased activity tolerance, impaired balance, generalized weakness. Continue to recommend SNF for ongoing Physical Therapy.       Follow Up Recommendations  SNF;Supervision/Assistance - 24 hour     Equipment Recommendations  Rolling [place] Stith with 5" wheels;3in1 (PT);Wheelchair (measurements PT);Wheelchair cushion (measurements PT)    Recommendations for Other Services       Precautions / Restrictions Precautions Precautions: Fall Precaution Comments: 3+ falls during this admission Restrictions Weight Bearing Restrictions: Yes LLE Weight Bearing: Non weight bearing    Mobility  Bed Mobility Overal bed mobility: Modified Independent                Transfers Overall transfer level: Needs assistance Equipment used: None Transfers: Sit to/from Stand Sit to Stand: Supervision Stand pivot transfers: Supervision Squat pivot transfers: Supervision     General transfer comment: supervision for safety, refuses physical assistance  Ambulation/Gait Ambulation/Gait assistance: Supervision Gait Distance (Feet): 45 Feet (x 35, x10) Assistive device: Rolling Curby [place] (2 wheeled) Gait Pattern/deviations: Step-to pattern;Decreased stride length     General Gait Details: supervision  for safety, refuses to be touched. No apparent LOB. seated rest break x 5 minutes   Stairs             Wheelchair Mobility    Modified Rankin (Stroke Patients Only)       Balance Overall balance assessment: Needs assistance Sitting-balance support: No upper extremity supported Sitting balance-[name]: Good     Standing balance support: Bilateral upper extremity supported;During functional activity Standing balance-[name]: Poor Standing balance comment: reliant on at least one UE support standing at bedside, BUE for dynamic tasks                            Cognition Arousal/Alertness: Awake/alert Behavior During Therapy: WFL for tasks assessed/performed Overall Cognitive Status: Impaired/Different from baseline Area of Impairment: Memory;Safety/judgement                     Memory: Decreased short-term memory   Safety/Judgement: Decreased awareness of safety;Decreased awareness of deficits     General Comments: improving mood, tangential conversation. Receptive to safety education but will need reinforcement to improve effectiveness      Exercises      General Comments General comments (skin integrity, edema, etc.): Educated patient about phantom limb pain due to patient experiencing pain at night      Pertinent Vitals/Pain Pain Assessment: Faces Faces Pain Scale: No hurt Pain Intervention(s): Monitored during session    Home Living                      Prior Function            PT Goals (current goals can now be found  in the care plan section) Acute Rehab PT Goals Patient Stated Goal: to go home PT Goal Formulation: With patient Time For Goal Achievement: 10/11/20 Potential to Achieve Goals: Good Progress towards PT goals: Progressing toward goals    Frequency    Min 3X/week      PT Plan Current plan remains appropriate    Co-evaluation              AM-PAC PT "6 Clicks" Mobility   Outcome  Measure  Help needed turning from your back to your side while in a flat bed without using bedrails?: None Help needed moving from lying on your back to sitting on the side of a flat bed without using bedrails?: None Help needed moving to and from a bed to a chair (including a wheelchair)?: A Little Help needed standing up from a chair using your arms (e.g., wheelchair or bedside chair)?: A Little Help needed to walk in hospital room?: A Little Help needed climbing 3-5 steps with a railing? : A Lot 6 Click Score: 19    End of Session   Activity Tolerance: Patient tolerated treatment well Patient left: in bed;with call bell/phone within reach Nurse Communication: Mobility status PT Visit Diagnosis: Unsteadiness on feet (R26.81);Other abnormalities of gait and mobility (R26.89);Muscle weakness (generalized) (M62.81);Difficulty in walking, not elsewhere classified (R26.2)     Time: 5993-5701 PT Time Calculation (min) (ACUTE ONLY): 42 min  Charges:  $Therapeutic Activity: 38-52 mins                     Avantika Shere A. Gilford Rile PT, DPT Acute Rehabilitation Services Pager [phone] Office [phone]    Alda Lea 10/02/2020, 4:03 PM

## 2020-10-02 NOTE — Procedures (Signed)
I was present at this dialysis session. I have reviewed the session itself and made appropriate changes.   6L UF goal, 2K bath.  TDC.  SNF/HD unit dispo in process.    Filed Weights   09/28/20 1035 09/30/20 0715 09/30/20 1100  Weight: 70 kg 77.2 kg 73.8 kg    Recent Labs  Lab 09/30/20 0939  NA 132*  K 5.9*  CL 97*  CO2 26  GLUCOSE 299*  BUN 37*  CREATININE 6.64*  CALCIUM 7.7*  PHOS 3.5    Recent Labs  Lab 09/26/20 0241 09/28/20 0902 09/30/20 0938  WBC 11.3* 10.8* 11.6*  HGB 9.1* 8.0* 7.5*  HCT 29.4* 27.9* 24.9*  MCV 92.5 97.2 97.3  PLT 248 296 288    Scheduled Meds: . (feeding supplement) PROSource Plus  30 mL Oral TID BM  . calcitRIOL  2.5 mcg Oral Q M,W,F-HD  . Chlorhexidine Gluconate Cloth  6 each Topical Q0600  . Chlorhexidine Gluconate Cloth  6 each Topical Q0600  . cinacalcet  60 mg Oral Q breakfast  . [START ON 10/04/2020] darbepoetin (ARANESP) injection - DIALYSIS  150 mcg Intravenous Q Fri-HD  . heparin  5,000 Units Subcutaneous Q8H  . heparin  5,000 Units Dialysis Once in dialysis  . heparin sodium (porcine)      . insulin aspart  0-6 Units Subcutaneous TID WC  . multivitamin  1 tablet Oral QHS  . sevelamer carbonate  4,000 mg Oral TID WC   Continuous Infusions: PRN Meds:.albuterol, haloperidol **OR** haloperidol lactate, HYDROmorphone (DILAUDID) injection, oxyCODONE, sevelamer carbonate   Pearson Grippe  MD 10/02/2020, 8:41 AM

## 2020-10-02 NOTE — Progress Notes (Signed)
OT Cancellation Note  Patient Details Name: [name] MRN: [id number] DOB: [birth year]   Cancelled Treatment:    Reason Eval/Treat Not Completed: Patient at procedure or test/ unavailable Pt off unit at HD. Will follow-up for OT session as time allows.   Layla Maw 10/02/2020, 8:37 AM

## 2020-10-02 NOTE — Progress Notes (Signed)
Occupational Therapy Treatment Patient Details Name: JACOBO MONCRIEF MRN: [id number] DOB: 10-20-1980 Today's Date: 10/02/2020    History of present illness Patient is a 40 y/o male with PMH significant for DM Type 1, gastroparesis, HTN, ESRD, chronic L foot diabetic wound infection. Patient admitted for sepsis 2/2 acute osteomyelitis involving L ankle and foot. 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   OT comments  Pt progressing towards OT goals well. Pt continues to present as high fall risk but declines physical assistance because pt wants to be able to complete tasks himself. Pt overall Supervision for pivots to/from [facility] without AD. Pt supervision for LB dressing and toileting hygiene with residual limb propped on BSC. Encouraged pt to use lateral leans for LB ADLs and/or at least one UE support at all times if completing tasks in standing to decrease fall risk. Educated pt on impact of falls on residual limb healing as pt inquiring about timeline for obtaining prosthetic. Improving problem solving for daily tasks, but due to continued safety concerns, SNF for short term rehab remains beneficial.    Follow Up Recommendations  Supervision/Assistance - 24 hour;SNF    Equipment Recommendations  3 in 1 bedside commode;Other (comment);Tub/shower bench;Wheelchair (measurements OT);Wheelchair cushion (measurements OT) (RW)    Recommendations for Other Services      Precautions / Restrictions Precautions Precautions: Fall Precaution Comments: 3+ falls during this admission Restrictions Weight Bearing Restrictions: Yes LLE Weight Bearing: Non weight bearing       Mobility Bed Mobility Overal bed mobility: Modified Independent                Transfers Overall transfer level: Needs assistance Equipment used: None Transfers: Sit to/from Stand;Stand Pivot Transfers;Squat Pivot Transfers Sit to Stand: Supervision Stand pivot transfers: Supervision Squat pivot  transfers: Supervision     General transfer comment: declines physical assistance from staff. supervision for transfer bed <> BSC x 2 without use of AD    Balance Overall balance assessment: Needs assistance Sitting-balance support: No upper extremity supported Sitting balance-[name]: Good     Standing balance support: Single extremity supported;Bilateral upper extremity supported;During functional activity Standing balance-[name]: Poor Standing balance comment: reliant on at least one UE support standing at bedside, BUE for dynamic tasks                           ADL either performed or assessed with clinical judgement   ADL Overall ADL's : Needs assistance/impaired                     Lower Body Dressing: Supervision/safety;Sit to/from stand;Sitting/lateral leans Lower Body Dressing Details (indicate cue type and reason): Pt able to don boxers sitting/standing EOB. decreased safety/balance in standing. Encouraged one UE support at all times for this task to decrease fall risk Toilet Transfer: Supervision/safety;Stand-pivot;Squat-pivot;BSC Armed forces technical officer Details (indicate cue type and reason): use of BSC armrest with squat/stand pivots. declines physical assist for safety. Toileting- Clothing Manipulation and Hygiene: Supervision/safety;Sitting/lateral lean;Sit to/from stand Toileting - Water quality scientist Details (indicate cue type and reason): supervision for safety, declines physical assist. props residual limb up on BSC to complete hygiene in standing > 2 minutes       General ADL Comments: Improved mood and problem solving for ADLs/transfers. Still presents as high fall risk.     Vision   Vision Assessment?: No apparent visual deficits   Perception  Praxis      Cognition Arousal/Alertness: Awake/alert Behavior During Therapy: WFL for tasks assessed/performed Overall Cognitive Status: Impaired/Different from baseline Area of  Impairment: Memory;Safety/judgement                     Memory: Decreased short-term memory   Safety/Judgement: Decreased awareness of safety;Decreased awareness of deficits     General Comments: improving mood, tangential conversation. Receptive to safety education but will need reinforcement to improve effectiveness        Exercises     Shoulder Instructions       General Comments Encouraged fall prevention strategies, safety of residual limb to ensure best healing. Pt inquiring about timeline of obtaining a prosthetic    Pertinent Vitals/ Pain       Pain Assessment: Faces Faces Pain Scale: No hurt Pain Intervention(s): Monitored during session;Patient requesting pain meds-RN notified  Home Living                                          Prior Functioning/Environment              Frequency  Min 2X/week        Progress Toward Goals  OT Goals(current goals can now be found in the care plan section)  Progress towards OT goals: Progressing toward goals  Acute Rehab OT Goals Patient Stated Goal: to go home OT Goal Formulation: With patient Time For Goal Achievement: 10/16/20 Potential to Achieve Goals: Good ADL Goals Pt Will Perform Lower Body Dressing: with modified independence;sitting/lateral leans;sit to/from stand Pt Will Transfer to Toilet: with modified independence;ambulating;regular height toilet Pt Will Perform Toileting - Clothing Manipulation and hygiene: with modified independence;sitting/lateral leans;sit to/from stand Additional ADL Goal #1: Pt to verbalize at least 2 fall prevention strategies to maximize safety at home  Plan Discharge plan remains appropriate    Co-evaluation                 AM-PAC OT "6 Clicks" Daily Activity     Outcome Measure   Help from another person eating meals?: None Help from another person taking care of personal grooming?: None Help from another person toileting, which  includes using toliet, bedpan, or urinal?: A Little Help from another person bathing (including washing, rinsing, drying)?: A Little Help from another person to put on and taking off regular upper body clothing?: A Little Help from another person to put on and taking off regular lower body clothing?: A Little 6 Click Score: 20    End of Session    OT Visit Diagnosis: Unsteadiness on feet (R26.81);Other abnormalities of gait and mobility (R26.89)   Activity Tolerance Patient tolerated treatment well   Patient Left in bed;with call bell/phone within reach   Nurse Communication Mobility status        Time: 1350-1420 OT Time Calculation (min): 30 min  Charges: OT General Charges $OT Visit: 1 Visit OT Treatments $Self Care/Home Management : 23-37 mins  Layla Maw, OTR/L   Layla Maw 10/02/2020, 2:39 PM

## 2020-10-02 NOTE — Progress Notes (Signed)
pt. request to challenge uf goal to 5.5L Veneta Penton, PA aware ok to increase as tolerated

## 2020-10-02 NOTE — Progress Notes (Signed)
[name]  YKD:[id number] DOB: [birth year] DOA: 09/10/2020 PCP: Kerin Perna, NP    Brief Narrative:  40 year old with a history of ESRD, DM 2, HTN, and a left foot diabetic ulcer who presented to the ED with complaints of pain in the left hand with erythema and swelling as well as warmth of the left foot.  During this hospitalization he has required a left BKA as well as I&D of a left wrist abscess.  Significant Events:  12/14 admit via ED 12/19 surgical I&D left wrist abscess 12/20 left BKA per vascular surgery  Antimicrobials:  Zosyn 12/14 Vancomycin 12/14 >12/23 Cefepime 12/15 >12/21 Ampicillin sulbactam 12/22 > 12/25 Cefazolin 12/26 >  DVT prophylaxis: Subcutaneous heparin  Subjective: Remains medically stable.  Is awaiting SNF placement for a rehab stay.  I have visited the patient in the dialysis unit during his dialysis treatment where he appeared to be resting comfortably in bed.  I explained to the patient that it was time that we begin to wean him off of the IV narcotic.  I explained that there was no way to continue this at the time of his discharge and that it was imperative that we found an adequate oral regimen to control his pain.  I explained my desire to decrease the dose of his current IV narcotic while continuing at the same frequency for now, and at the same time increasing the frequency of his oral narcotic pain medication.  He was very unhappy to receive this news and continued to insist that I continue his narcotic via IV at the same dose and frequency up until the date of his discharge.  I explained to him in great detail that this would be counterproductive and result in severely uncontrolled pain after his discharge.  I reassured him that he if he suffered severe uncontrolled pain as a result of this adjustment that we would make other adjustments in his pain regimen.  I discussed the possibility of a fentanyl patch for background pain control on top  of his oral pain medications but he told me if it was not via IV he had no interest in using fentanyl.  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 -completed 1 additional week of antibiotics - wound care per VVS -medically stable  Left wrist abscess Evaluated by hand surgery with I&D on 12/19 -MSSA on culture - transitioned to wet-to-dry dressings per suggestion of hand surgery with significant improvement noted following hydrotherapy -medically stable  ESRD -M/W/F HD Nephrology following -was to get AVG 12/28 but patient refused surgery -continues with ongoing dialysis - frequently leaves treatment early  Uncontrolled DM2 Experienced recurring episodes of hypoglycemia when Lantus nightly dosing attempted - remain off Lantus for now at patient's request and follow trend -CBG remains quite labile but patient prefers hyperglycemia out of fear of severe hypoglycemia  Anemia of chronic kidney disease Transfused 1 unit PRBC with dialysis -hemoglobin steady -future transfusions at discretion of nephrology during dialysis treatments  Possible bipolar disorder Exhibited intermittent episodes of aggression psychosis and threatening behavior toward self and staff during this hospital stay -required transient IVC during this hospital stay -psychiatry was consulted - was not a candidate for inpt tx   Moderate malnutrition in context of chronic illness Nutrition team following  Disposition difficulty Does not have access to appropriate assistance at home -clinically needs SNF -medically stable for discharge awaiting procurement  of SNF bed   Code Status: FULL CODE Family Communication: No family present at time of exam Status is: Inpatient  Remains inpatient appropriate because:Unsafe d/c plan   Dispo: The patient is from: Home              Anticipated d/c is to: SNF               Anticipated d/c date is: 1 day              Patient currently is medically stable to d/c.  Consultants:  Vascular surgery Nephrology Hand surgery  Objective: Blood pressure (!) 143/39, pulse 98, temperature 98.7 F (37.1 C), temperature source Oral, resp. rate 18, height 5\' 7"  (1.702 m), weight 80.4 kg, SpO2 98 %. No intake or output data in the 24 hours ending 10/02/20 0923 Filed Weights   09/30/20 0715 09/30/20 1100 10/02/20 0715  Weight: 77.2 kg 73.8 kg 80.4 kg    Examination: General: No acute respiratory distress Lungs: no wheezing -good air movement throughout Cardiovascular: RRR Ext: Wounds dressed and dry  CBC: Recent Labs  Lab 09/28/20 0902 09/30/20 0938 10/02/20 0838  WBC 10.8* 11.6* 10.1  HGB 8.0* 7.5* 7.2*  HCT 27.9* 24.9* 23.8*  MCV 97.2 97.3 97.5  PLT 296 288 621   Basic Metabolic Panel: Recent Labs  Lab 09/28/20 0905 09/30/20 0939 10/02/20 0838  NA 130* 132* 133*  K 5.4* 5.9* 6.2*  CL 93* 97* 96*  CO2 24 26 28   GLUCOSE 409* 299* 152*  BUN 31* 37* 39*  CREATININE 6.45* 6.64* 6.63*  CALCIUM 8.2* 7.7* 8.6*  PHOS 4.4 3.5 3.9   GFR: Estimated Creatinine Clearance: 14.9 mL/min (A) (by C-G formula based on SCr of 6.63 mg/dL (H)).  Liver Function Tests: Recent Labs  Lab 09/26/20 0241 09/28/20 0905 09/30/20 0939 10/02/20 0838  ALBUMIN 1.8* 1.8* 1.7* 1.9*    HbA1C: Hemoglobin A1C  Date/Time Value Ref Range Status  08/22/2014 06:28 AM 5.8 4.2 - 6.3 % Final    Comment:    The American Diabetes Association recommends that a primary goal of therapy should be <7% and that physicians should reevaluate the treatment regimen in patients with HbA1c values consistently >8%.   06/28/2014 08:04 PM 7.1 (H) 4.2 - 6.3 % Final    Comment:    The American Diabetes Association recommends that a primary goal of therapy should be <7% and that physicians should reevaluate the treatment regimen in patients with HbA1c values consistently >8%.    Hgb A1c  MFr Bld  Date/Time Value Ref Range Status  09/25/2020 02:48 AM 7.3 (H) 4.8 - 5.6 % Final    Comment:    (NOTE) Pre diabetes:          5.7%-6.4%  Diabetes:              >6.4%  Glycemic control for   <7.0% adults with diabetes   07/22/2020 06:56 AM 7.5 (H) 4.8 - 5.6 % Final    Comment:    (NOTE)         Prediabetes: 5.7 - 6.4         Diabetes: >6.4         Glycemic control for adults with diabetes: <7.0     CBG: Recent Labs  Lab 10/01/20 0913 10/01/20 1120 10/01/20 1644 10/01/20 2053 10/02/20 0654  GLUCAP 288* 229* 155* 68* 146*     Scheduled Meds: . (feeding supplement) PROSource Plus  30 mL Oral TID BM  .  calcitRIOL  2.5 mcg Oral Q M,W,F-HD  . Chlorhexidine Gluconate Cloth  6 each Topical Q0600  . Chlorhexidine Gluconate Cloth  6 each Topical Q0600  . cinacalcet  60 mg Oral Q breakfast  . [START ON 10/04/2020] darbepoetin (ARANESP) injection - DIALYSIS  150 mcg Intravenous Q Fri-HD  . heparin  5,000 Units Subcutaneous Q8H  . insulin aspart  0-6 Units Subcutaneous TID WC  . multivitamin  1 tablet Oral QHS  . sevelamer carbonate  4,000 mg Oral TID WC     LOS: 21 days   Cherene Altes, MD [facility] Office  [phone] Pager - Text Page per Amion  If 7PM-7AM, please contact night-coverage per Amion 10/02/2020, 9:23 AM

## 2020-10-02 NOTE — Progress Notes (Signed)
Nutrition Follow-up  DOCUMENTATION CODES:   Non-severe (moderate) malnutrition in context of chronic illness  INTERVENTION:   -Continue liberalized diet of regular -Continue renal MVI daily -Continue30 ml Prosource Plus TID, each supplement provides 100 kcals and 15 grams protein  NUTRITION DIAGNOSIS:   Moderate Malnutrition related to chronic illness (ESRD on HD) as evidenced by mild fat depletion,moderate fat depletion,moderate muscle depletion,severe muscle depletion.  Ongoing  GOAL:   Patient will meet greater than or equal to 90% of their needs  Progressing   MONITOR:   PO intake,Supplement acceptance,Labs,Weight trends,Skin,I & O's  REASON FOR ASSESSMENT:   Consult Diet education  ASSESSMENT:   [name] is a 40 y.o. 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  States he has been seeing podiatry about this, Dr. March Rummage.  States wound seems to be worsening and is largere. Now foot has started to become red and warm to the touch with increased pain. Has developed a foul smelling drainage in past few days. He reports subjective fever and intermittent chills the past day. States he has been compliant with his dialysis treatment, due for treatment again tomorrow. He has missed some follow up with podiatry he states. Xray shows osteomyelitis in foot and pt is started on empiric antibiotics in the ER. He has elevated WBC on labs.  12/18- MRI of lt wrist region reveals early abscess vs phlegmon 12/19- s/pPROCEDURE:Incision and drainage left wrist lesion; cultures sent; plan to start hydrotherapy 3-4 days post-op 12/20- s/p PROCEDURE:Left below-knee amputation 12/25- hydrotherapy initiated to lt wrist abscess 12/31- transitioned to wet to dry dressings, hydrotherapy d/c  Reviewed I/O's: -7.1 L since  09/18/20  Pt unavailable at time of attempted contact.   Pt's intake has improved; noted meal completion 100%. Pt typically compliant with Prosource Plus supplements, however, has refused the past two doses.Marland Kitchen He is resistant to take any other supplements [employer], Nepro, Ensure, etc).   Per TOC notes, pt awaiting SNF placement and HD seat.   Medications reviewed and include sensipar, aranesp, and renvela.   Labs reviewed: CBGS: [id number] (inpatient orders for glycemic control are 0-6 units insulin aspart TID with meals).   Diet Order:   Diet Order            Diet regular Room service appropriate? Yes; Fluid consistency: Thin  Diet effective now                 EDUCATION NEEDS:   Education needs have been addressed  Skin:  Skin Assessment: Skin Integrity Issues: Skin Integrity Issues:: Other (Comment),Incisions Incisions: s/p lt BKA Other: abscess to lt wrist  Last BM:  09/30/19  Height:   Ht Readings from Last 1 Encounters:  09/10/20 5\' 7"  (1.702 m)    Weight:   Wt Readings from Last 1 Encounters:  09/30/20 73.8 kg    Ideal Body Weight:  62.9 kg (adjusted for lt BKA)  BMI:  Body mass index is 25.48 kg/m.  Estimated Nutritional Needs:   Kcal:  2050-2250  Protein:  105-120 grams  Fluid:  1000 ml + UOP    Loistine Chance, RD, LDN, [place] Registered Dietitian II Certified Diabetes Care and Education Specialist Please refer to [facility] for RD and/or RD on-call/weekend/after hours pager

## 2020-10-02 NOTE — Progress Notes (Signed)
Renal Navigator awaiting disposition update in order to be able to finalize outpatient HD seat transfer.  Renal Navigator will continue to follow.  Alphonzo Cruise, [place] Renal Navigator [phone]

## 2020-10-02 NOTE — Progress Notes (Signed)
Inpatient Diabetes Program Recommendations  AACE/ADA: New Consensus Statement on Inpatient Glycemic Control (2015)  Target Ranges:  Prepandial:   less than 140 mg/dL      Peak postprandial:   less than 180 mg/dL (1-2 hours)      Critically ill patients:  140 - 180 mg/dL   Lab Results  Component Value Date   GLUCAP 240 (H) 10/02/2020   HGBA1C 7.3 (H) 09/25/2020    Review of Glycemic Control  Diabetes history: DM Outpatient Diabetes medications: Latus 6 units QHS, Novolog 6 units BID with meals Current orders for Inpatient glycemic control: Novolog 0-6 units TID with meals  Post-prandials elevated. May benefit from small amount Novolog.  Inpatient Diabetes Program Recommendations:     Add Novolog 2 units tidwc for meal coverage insulin if pt eats > 50% meal.   Continue to follow.  Thank you. Lorenda Peck, RD, LDN, CDE Inpatient Diabetes Coordinator [phone]

## 2020-10-03 DIAGNOSIS — E08621 Diabetes mellitus due to underlying condition with foot ulcer: Secondary | ICD-10-CM | POA: Diagnosis not present

## 2020-10-03 DIAGNOSIS — M86172 Other acute osteomyelitis, left ankle and foot: Secondary | ICD-10-CM | POA: Diagnosis not present

## 2020-10-03 DIAGNOSIS — N186 End stage renal disease: Secondary | ICD-10-CM | POA: Diagnosis not present

## 2020-10-03 DIAGNOSIS — F4320 Adjustment disorder, unspecified: Secondary | ICD-10-CM | POA: Diagnosis not present

## 2020-10-03 LAB — GLUCOSE, CAPILLARY
Glucose-Capillary: 219 mg/dL — ABNORMAL HIGH (ref 70–99)
Glucose-Capillary: 231 mg/dL — ABNORMAL HIGH (ref 70–99)
Glucose-Capillary: 60 mg/dL — ABNORMAL LOW (ref 70–99)

## 2020-10-03 NOTE — Progress Notes (Signed)
PROGRESS NOTE  [name] QIH:[id number] DOB: 16-Jan-1981 DOA: 09/10/2020 PCP: Kerin Perna, NP   LOS: 22 days   Brief narrative: As per HPI,  40 year old with a history of ESRD on hemodialysis, DM 2, HTN, and a left foot diabetic ulcer who presented to the ED with complaints of pain in the left hand with erythema and swelling as well as warmth of the left foot.  He had been following podiatry as outpatient.  Patient was then admitted hospital for acute osteomyelitis involving the ankle on the foot on the left with sepsis. During this hospitalization, he  required a left BKA as well as I&D of a left wrist abscess.  Hospital course was prolonged due to his medical comorbidities and placement difficulty.  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  Patient has completed course of antibiotics.  Continue wound care.  Left leg pain.  IV narcotic seeking tendencies.  Patient is very vocal about receiving IV Dilaudid.  I have offered the option of fentanyl patch and oral oxycodone for transition, but he does not want to discuss about that much  Left wrist abscess Status post I&D on 12/19 -MSSA on abscess culture,  completed antibiotic course.  Continue  to wet-to-dry dressings as per hand surgery.  Also needed hydrotherapy.  Improving.  ESRD on hemodialysis-M/W/F. Nephrology order plan was to obtain AV graft on 1228 but patient had  refused.  Continue hemodialysis while in hospital. Patient with history of leaving hemodialysis early in the outpatient settings  Uncontrolled DM2 Off Lantus at this time due to episodes of hypoglycemia.  Continue sliding scale insulin Accu-Cheks diabetic diet.  Labile blood glucose control.  Anemia of chronic kidney disease Has received 1 unit of packed RBC with hemodialysis.  Monitor and transfuse as necessary.  Possible bipolar disorder She exhibited intermittent increasing psychosis and threatening behavior during hospitalization and required IVC.  Psychiatry was consulted - was not a candidate for inpt tx   Moderate malnutrition in context of chronic illness Dietary on board.  Disposition difficulty Patient with lack of  assistance at home.  Need for skilled nursing facility.  Medically stable at this time.  Awaiting for placement.   DVT prophylaxis: SCD's Start: 09/15/20 0359 heparin injection 5,000 Units Start: 09/11/20 0600  Code Status: Full code  Family Communication: None  Status is: Inpatient  Remains inpatient appropriate because:Unsafe d/c plan  Dispo:  Patient From: Home  Planned Disposition: [place]  Expected discharge date: 10/07/2020 or when bed is available  Medically stable for discharge: Yes  Consultants: Vascular surgery Nephrology Hand surgery  Procedures:  Incision and drainage of wrist abscess on 09/15/20  Left BKA on 09/16/2020  Antibiotics:  . None currently  Subjective: Today, patient was seen and examined at bedside.  Patient started the conversation by talking about the IV Dilaudid.  He complains of severe pain and wants to knock the pain off. Very reluctant about other options for pain.  Denies  fever, chills nausea vomiting.   Objective: Vitals:   10/02/20 1458 10/02/20 2149  BP: 122/73 (!) 108/51  Pulse: 99 (!) 105  Resp: 14 18  Temp: (!) 97.2 F (36.2 C) (!) 97.5 F (36.4 C)  SpO2: 96% 100%     Intake/Output Summary (Last 24 hours) at 10/03/2020 0734 Last data filed at 10/02/2020 1300 Gross per 24 hour  Intake 480 ml  Output 5500 ml  Net -5020 ml   Filed Weights   09/30/20 0715 09/30/20 1100 10/02/20 0715  Weight: 77.2 kg 73.8 kg 80.4 kg   Body mass index is 27.76 kg/m.   Physical Exam:  GENERAL: Patient is alert awake and oriented. Not in obvious distress.  Pressurized speech. HENT: No scleral pallor or icterus. Pupils equally reactive to light. Oral mucosa is moist NECK: is supple, no gross swelling noted. CHEST: Clear to auscultation.   Diminished breath sounds bilaterally. CVS: S1 and S2 heard, no murmur. Regular rate and rhythm.  ABDOMEN: Soft, non-tender, bowel sounds are present. EXTREMITIES: Status post left below-knee amputation , wrapped.  Left wrist with dressing  CNS: Cranial nerves are intact. No focal motor deficits. SKIN: warm and dry,  Data Review: I have personally reviewed the following laboratory data and studies,  CBC: Recent Labs  Lab 09/28/20 0902 09/30/20 0938 10/02/20 0838  WBC 10.8* 11.6* 10.1  HGB 8.0* 7.5* 7.2*  HCT 27.9* 24.9* 23.8*  MCV 97.2 97.3 97.5  PLT 296 288 071   Basic Metabolic Panel: Recent Labs  Lab 09/28/20 0905 09/30/20 0939 10/02/20 0838  NA 130* 132* 133*  K 5.4* 5.9* 6.2*  CL 93* 97* 96*  CO2 24 26 28   GLUCOSE 409* 299* 152*  BUN 31* 37* 39*  CREATININE 6.45* 6.64* 6.63*  CALCIUM 8.2* 7.7* 8.6*  PHOS 4.4 3.5 3.9   Liver Function Tests: Recent Labs  Lab 09/28/20 0905 09/30/20 0939 10/02/20 0838  ALBUMIN 1.8* 1.7* 1.9*   No results for input(s): LIPASE, AMYLASE in the last 168 hours. No results for input(s): AMMONIA in the last 168 hours. Cardiac Enzymes: No results for input(s): CKTOTAL, CKMB, CKMBINDEX, TROPONINI in the last 168 hours. BNP (last 3 results) No results for input(s): BNP in the last 8760 hours.  ProBNP (last 3 results) No results for input(s): PROBNP in the last 8760  hours.  CBG: Recent Labs  Lab 10/01/20 2053 10/02/20 0654 10/02/20 1203 10/02/20 1636 10/02/20 1956  GLUCAP 68* 146* 240* 285* 75   No results found for this or any previous visit (from the past 240 hour(s)).   Studies: No results found.    Flora Lipps, MD  [facility] 10/03/2020  If 7PM-7AM, please contact night-coverage

## 2020-10-03 NOTE — Progress Notes (Signed)
[facility] Progress Note   Subjective:  Seen in room. Tells me that has been drinking a lot of water in the room - realizes that he needs to cut back, worried that weight will be up tomorrow. No dyspnea at the moment - counseled on fluid restrictions. Discharge plan still pending -?awaiting new dialysis unit. See RN note from this morning that stump wound have had dehisced more?   Objective Vitals:   10/02/20 0956 10/02/20 1458 10/02/20 2149 10/03/20 0803  BP: 122/65 122/73 (!) 108/51 (!) 168/95  Pulse:  99 (!) 105 90  Resp:  14 18 18   Temp:  (!) 97.2 F (36.2 C) (!) 97.5 F (36.4 C) 97.9 F (36.6 C)  TempSrc:  Oral Oral Oral  SpO2:  96% 100% 93%  Weight:      Height:       Physical Exam General: Well appearing man, NAD. Room air. Heart: RRR, no murmur Lungs: CTA bilaterally without wheezing, rhonchi or rales Abdomen: Soft, non-tender Extremities: No RLE edema, L stump bandaged without edema Dialysis Access: R IJ [facility]  Additional Objective Labs: Basic Metabolic Panel: Recent Labs  Lab 09/28/20 0905 09/30/20 0939 10/02/20 0838  NA 130* 132* 133*  K 5.4* 5.9* 6.2*  CL 93* 97* 96*  CO2 24 26 28   GLUCOSE 409* 299* 152*  BUN 31* 37* 39*  CREATININE 6.45* 6.64* 6.63*  CALCIUM 8.2* 7.7* 8.6*  PHOS 4.4 3.5 3.9   Liver Function Tests: Recent Labs  Lab 09/28/20 0905 09/30/20 0939 10/02/20 0838  ALBUMIN 1.8* 1.7* 1.9*   CBC: Recent Labs  Lab 09/28/20 0902 09/30/20 0938 10/02/20 0838  WBC 10.8* 11.6* 10.1  HGB 8.0* 7.5* 7.2*  HCT 27.9* 24.9* 23.8*  MCV 97.2 97.3 97.5  PLT 296 288 301   Medications:  . (feeding supplement) PROSource Plus  30 mL Oral TID BM  . calcitRIOL  2.5 mcg Oral Q M,W,F-HD  . Chlorhexidine Gluconate Cloth  6 each Topical Q0600  . Chlorhexidine Gluconate Cloth  6 each Topical Q0600  . cinacalcet  60 mg Oral Q breakfast  . [START ON 10/04/2020] darbepoetin (ARANESP) injection - DIALYSIS  150 mcg Intravenous Q Fri-HD  .  heparin  5,000 Units Subcutaneous Q8H  . insulin aspart  0-6 Units Subcutaneous TID WC  . multivitamin  1 tablet Oral QHS  . sevelamer carbonate  4,000 mg Oral TID WC   Dialysis Orders: MWF @ [facility] 4h 52min 400/800 67kg TDC Hep 5000 + 5000 midrun - Calcitriol 64mcg PO q HD - No ESA ** Scheduled for R BC AVF on 09/24/20, canceled/delayed per patient request.  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 Finished Cefazolin. Has small area of wound dehiscence - per primary team. 2. ESRD:Usual MWF schedule - next HD 1/7. 3.HTN/volume:Up on weight, drinking too much - ^ UF goal tomorrow. 4. Anemia:S/p1U PRBCs on 12/27 and Aranesp dose increased. Hgb improved to 9.1 - down falling again. FOBT ordered. 5. Secondary hyperparathyroidism:CorrCa/Phos good- continue binders/VDRA/sensipar. 6. Nutrition:Alb low - continue protein supplements. 7. Bipolar disorder + underlying personality disorder:Severe aggression earlier this admit - improved slowly. Psych consulted on 12/24 - did not meet criteria for inpatient psych. 8.L wrist abscess: S/p I&D 12/19 - now s/p antibiotic course. 9. T2DM: Chronically uncontrolled. 10: Itching/papules:Chronic per pt. Pruritis can be due to chronically high phosphorus/uremia. If persistent, outpatient dermatology consult may be appropriate 11. Dispo: Plan is d/c to SNF/rehab - pending.  Veneta Penton, PA-C 10/03/2020, 11:04  AM  [employer]

## 2020-10-03 NOTE — Plan of Care (Signed)

## 2020-10-03 NOTE — TOC Progression Note (Addendum)
Transition of Care [facility]) - Progression Note    Patient Details  Name: [name] MRN: [id number] Date of Birth: [birth year]  Transition of Care [facility]) CM/SW Contact  Bartholomew Crews, RN Phone Number: [phone] 10/03/2020, 4:17 PM  Clinical Narrative:     Damaris Schooner with [name] at [facility]. Bed offer has been rescinded d/t facility opening up a covid unit. Facility is requiring all other patients to be vaccinated for their safety. [name] offered to pursue bed offer at [facility] if patient is agreeable. Spoke with pateint at the bedside to discuss transition plans. Patient also put his mother on the phone. Paitent is requesting to consider this option overnight and will finalized decision in the morning.   Patient had received one other bed offer. Discussed this offer - patient declined to consider the other facility.   Spoke with [name] at [facility] to update.   Spoke with Colleen/renal navigator to discuss patient being assigned a transient seat at [facility].   Will follow up in the morning.   Expected Discharge Plan: Home/Self Care Barriers to Discharge: Continued Medical Work up  Expected Discharge Plan and Services Expected Discharge Plan: Home/Self Care   Discharge Planning Services: CM Consult     Expected Discharge Date: 09/28/20                                     Social Determinants of Health (SDOH) Interventions    Readmission Risk Interventions No flowsheet data found.

## 2020-10-03 NOTE — Progress Notes (Signed)
Nurse noted during dressing change this morning that, the amputated leg wound site is open wide, with some exposed muscle, some of the staples had removed, just a little concern about it, need to be recheck before discharge, day shift nurse notified, will continue to monitor

## 2020-10-03 NOTE — Progress Notes (Addendum)
Subjective: S/p I&D left wrist abscess.  States left wrist with minimal pain.  Has been getting daily dressing changes. Patient reports pain as 3 on 0-10 scale.    Objective: Vital signs in last 24 hours: Temp:  [97.5 F (36.4 C)-97.9 F (36.6 C)] 97.9 F (36.6 C) (01/06 0803) Pulse Rate:  [90-105] 90 (01/06 0803) Resp:  [18] 18 (01/06 0803) BP: (108-168)/(51-95) 168/95 (01/06 0803) SpO2:  [93 %-100 %] 93 % (01/06 0803)  Intake/Output from previous day: 01/05 0701 - 01/06 0700 In: 480 [P.O.:480] Out: 5500  Intake/Output this shift: Total I/O In: 1080 [P.O.:1080] Out: -   Recent Labs    10/02/20 0838  HGB 7.2*   Recent Labs    10/02/20 0838  WBC 10.1  RBC 2.44*  HCT 23.8*  PLT 301   Recent Labs    10/02/20 0838  NA 133*  K 6.2*  CL 96*  CO2 28  BUN 39*  CREATININE 6.63*  GLUCOSE 152*  CALCIUM 8.6*   No results for input(s): LABPT, INR in the last 72 hours.  Intact sensation and capillary refill.  Wound without erythema or drainage.  Granulation tissue.  Assessment/Plan: Continue with local wound care.  Daily dressing changes.  Remove sutures from left wrist wound.   Leanora Cover 10/03/2020, 6:00 PM

## 2020-10-03 NOTE — Progress Notes (Signed)
Subjective  -   S/p left BKA for infection.  The lateral part of the incision was left open due to necrosis.  The patient recently had a fall and we were asked to evaluate the wound   Physical Exam:  Staples remain intact.  There is good granulation tissue on the open area of the lateral insicion.  I was able to get a q-tip in about 1.5 cm around the skin edge and exposed muscle       Assessment/Plan:    Healing BKA.  I have recommended packing the lateral edge of the wound with wet to dry dressing to be changed daily.  Patient will keep his scheduled office follow up.  Please call with any additional questions  [name] 10/03/2020 5:18 PM --  Vitals:   10/02/20 2149 10/03/20 0803  BP: (!) 108/51 (!) 168/95  Pulse: (!) 105 90  Resp: 18 18  Temp: (!) 97.5 F (36.4 C) 97.9 F (36.6 C)  SpO2: 100% 93%    Intake/Output Summary (Last 24 hours) at 10/03/2020 1718 Last data filed at 10/03/2020 1700 Gross per 24 hour  Intake 1080 ml  Output --  Net 1080 ml     Laboratory CBC    Component Value Date/Time   WBC 10.1 10/02/2020 0838   HGB 7.2 (L) 10/02/2020 0838   HGB 9.7 (L) 08/22/2014 0447   HCT 23.8 (L) 10/02/2020 0838   HCT 30.0 (L) 08/22/2014 0447   PLT 301 10/02/2020 0838   PLT 158 08/22/2014 0447    BMET    Component Value Date/Time   NA 133 (L) 10/02/2020 0838   NA 138 08/22/2014 0447   K 6.2 (H) 10/02/2020 0838   K 4.0 08/22/2014 0447   CL 96 (L) 10/02/2020 0838   CL 100 08/22/2014 0447   CO2 28 10/02/2020 0838   CO2 30 08/22/2014 0447   GLUCOSE 152 (H) 10/02/2020 0838   GLUCOSE 88 08/22/2014 0447   BUN 39 (H) 10/02/2020 0838   BUN 14 08/22/2014 0447   CREATININE 6.63 (H) 10/02/2020 0838   CREATININE 5.00 (H) 08/22/2014 0447   CALCIUM 8.6 (L) 10/02/2020 0838   CALCIUM 8.2 (L) 08/22/2014 0447   GFRNONAA 10 (L) 10/02/2020 0838   GFRNONAA 14 (L) 08/22/2014 0447   GFRNONAA 5 (L) 06/19/2014 0937   GFRAA 7 (L) 05/30/2020 1923   GFRAA 17 (L)  08/22/2014 0447   GFRAA 6 (L) 06/19/2014 0937    COAG Lab Results  Component Value Date   INR 1.2 07/22/2020   INR 1.0 05/30/2020   INR 1.2 04/24/2019   No results found for: PTT  Antibiotics Anti-infectives (From admission, onward)   Start     Dose/Rate Route Frequency Ordered Stop   09/25/20 1800  ceFAZolin (ANCEF) IVPB 1 g/50 mL premix  Status:  Discontinued        1 g 100 mL/hr over 30 Minutes Intravenous Every 24 hours 09/25/20 1100 09/29/20 1056   09/23/20 1200  ceFAZolin (ANCEF) IVPB 2g/100 mL premix  Status:  Discontinued       "Followed by" Linked Group Details   2 g 200 mL/hr over 30 Minutes Intravenous Every M-W-F (Hemodialysis) 09/22/20 1029 09/25/20 1100   09/22/20 1115  ceFAZolin (ANCEF) IVPB 2g/100 mL premix       "Followed by" Linked Group Details   2 g 200 mL/hr over 30 Minutes Intravenous  Once 09/22/20 1029 09/22/20 2318   09/19/20 1254  Vancomycin (VANCOCIN) 750-5 MG/150ML-%  IVPB       Note to Pharmacy: Judieth Keens  : cabinet override      09/19/20 1254 09/19/20 1317   09/19/20 1200  vancomycin (VANCOCIN) IVPB 750 mg/150 ml premix        750 mg 150 mL/hr over 60 Minutes Intravenous Every T-Th-Sa (Hemodialysis) 09/19/20 1034 09/19/20 1414   09/18/20 0800  Ampicillin-Sulbactam (UNASYN) 3 g in sodium chloride 0.9 % 100 mL IVPB  Status:  Discontinued        3 g 200 mL/hr over 30 Minutes Intravenous Every 12 hours 09/17/20 1226 09/22/20 1029   09/13/20 0933  Vancomycin (VANCOCIN) 750-5 MG/150ML-% IVPB       Note to Pharmacy: Herriott, Melisa   : cabinet override      09/13/20 0933 09/13/20 2144   09/11/20 1200  vancomycin (VANCOCIN) IVPB 750 mg/150 ml premix  Status:  Discontinued        750 mg 150 mL/hr over 60 Minutes Intravenous Every M-W-F (Hemodialysis) 09/11/20 0238 09/20/20 1134   09/11/20 1130  ceFEPIme (MAXIPIME) 1 g in sodium chloride 0.9 % 100 mL IVPB        1 g 200 mL/hr over 30 Minutes Intravenous Every 24 hours 09/11/20 0919 09/17/20  0921   09/11/20 0300  piperacillin-tazobactam (ZOSYN) IVPB 2.25 g  Status:  Discontinued        2.25 g 100 mL/hr over 30 Minutes Intravenous Every 8 hours 09/11/20 0238 09/11/20 0858   09/11/20 0300  vancomycin (VANCOREADY) IVPB 1250 mg/250 mL        1,250 mg 166.7 mL/hr over 90 Minutes Intravenous  Once 09/11/20 0238 09/11/20 0613       V. Leia Alf, M.D., [facility] [place] Office: [phone] Pager:  [phone]

## 2020-10-04 DIAGNOSIS — F4320 Adjustment disorder, unspecified: Secondary | ICD-10-CM | POA: Diagnosis not present

## 2020-10-04 DIAGNOSIS — E08621 Diabetes mellitus due to underlying condition with foot ulcer: Secondary | ICD-10-CM | POA: Diagnosis not present

## 2020-10-04 DIAGNOSIS — N186 End stage renal disease: Secondary | ICD-10-CM | POA: Diagnosis not present

## 2020-10-04 DIAGNOSIS — M86172 Other acute osteomyelitis, left ankle and foot: Secondary | ICD-10-CM | POA: Diagnosis not present

## 2020-10-04 LAB — CBC
HCT: 23.9 % — ABNORMAL LOW (ref 39.0–52.0)
Hemoglobin: 7 g/dL — ABNORMAL LOW (ref 13.0–17.0)
MCH: 29.2 pg (ref 26.0–34.0)
MCHC: 29.3 g/dL — ABNORMAL LOW (ref 30.0–36.0)
MCV: 99.6 fL (ref 80.0–100.0)
Platelets: 292 10*3/uL (ref 150–400)
RBC: 2.4 MIL/uL — ABNORMAL LOW (ref 4.22–5.81)
RDW: 16.3 % — ABNORMAL HIGH (ref 11.5–15.5)
WBC: 9.8 10*3/uL (ref 4.0–10.5)
nRBC: 0 % (ref 0.0–0.2)

## 2020-10-04 LAB — RENAL FUNCTION PANEL
Albumin: 2 g/dL — ABNORMAL LOW (ref 3.5–5.0)
Anion gap: 8 (ref 5–15)
BUN: 46 mg/dL — ABNORMAL HIGH (ref 6–20)
CO2: 27 mmol/L (ref 22–32)
Calcium: 8.8 mg/dL — ABNORMAL LOW (ref 8.9–10.3)
Chloride: 98 mmol/L (ref 98–111)
Creatinine, Ser: 6.72 mg/dL — ABNORMAL HIGH (ref 0.61–1.24)
GFR, Estimated: 10 mL/min — ABNORMAL LOW (ref >60)
Glucose, Bld: 154 mg/dL — ABNORMAL HIGH (ref 70–99)
Phosphorus: 4.1 mg/dL (ref 2.5–4.6)
Potassium: 6.7 mmol/L (ref 3.5–5.1)
Sodium: 133 mmol/L — ABNORMAL LOW (ref 135–145)

## 2020-10-04 LAB — GLUCOSE, CAPILLARY
Glucose-Capillary: 175 mg/dL — ABNORMAL HIGH (ref 70–99)
Glucose-Capillary: 165 mg/dL — ABNORMAL HIGH (ref 70–99)
Glucose-Capillary: 343 mg/dL — ABNORMAL HIGH (ref 70–99)
Glucose-Capillary: 96 mg/dL (ref 70–99)

## 2020-10-04 MED ORDER — FENTANYL 25 MCG/HR TD PT72
1.0000 | MEDICATED_PATCH | TRANSDERMAL | Status: DC
  Administered 2020-10-04 – 2020-10-10 (×3): 1 via TRANSDERMAL
  Filled 2020-10-04 (×3): qty 1

## 2020-10-04 MED ORDER — HEPARIN SODIUM (PORCINE) 1000 UNIT/ML DIALYSIS: 1000.0000 [IU] | INTRAMUSCULAR | Status: DC | PRN

## 2020-10-04 MED ORDER — SODIUM CHLORIDE 0.9 % IV SOLN: 100.0000 mL | INTRAVENOUS | Status: DC | PRN

## 2020-10-04 MED ORDER — HEPARIN SODIUM (PORCINE) 1000 UNIT/ML DIALYSIS
5000.0000 [IU] | Freq: Once | INTRAMUSCULAR | Status: AC
  Administered 2020-10-04: 5000 [IU] via INTRAVENOUS_CENTRAL
  Filled 2020-10-04: qty 5

## 2020-10-04 MED ORDER — HEPARIN SODIUM (PORCINE) 1000 UNIT/ML DIALYSIS: 20.0000 [IU]/kg | INTRAMUSCULAR | Status: DC | PRN

## 2020-10-04 MED ORDER — HEPARIN SODIUM (PORCINE) 1000 UNIT/ML IJ SOLN
INTRAMUSCULAR | Status: AC
  Filled 2020-10-04: qty 4

## 2020-10-04 MED ORDER — OXYCODONE HCL 5 MG PO TABS
15.0000 mg | ORAL_TABLET | ORAL | Status: DC | PRN
  Administered 2020-10-04 – 2020-10-05 (×4): 15 mg via ORAL
  Filled 2020-10-04 (×4): qty 3

## 2020-10-04 MED ORDER — OXYCODONE HCL 5 MG PO TABS
10.0000 mg | ORAL_TABLET | ORAL | Status: DC | PRN
  Administered 2020-10-04 (×2): 10 mg via ORAL
  Filled 2020-10-04 (×2): qty 2

## 2020-10-04 MED ORDER — HEPARIN SODIUM (PORCINE) 1000 UNIT/ML IJ SOLN
INTRAMUSCULAR | Status: AC
  Administered 2020-10-04: 1000 mL
  Filled 2020-10-04: qty 5

## 2020-10-04 MED ORDER — HYDROMORPHONE HCL 1 MG/ML IJ SOLN
INTRAMUSCULAR | Status: AC
  Administered 2020-10-04: 0.5 mg
  Filled 2020-10-04: qty 0.5

## 2020-10-04 MED ORDER — CALCITRIOL 0.5 MCG PO CAPS
ORAL_CAPSULE | ORAL | Status: AC
  Filled 2020-10-04: qty 5

## 2020-10-04 MED ORDER — ALTEPLASE 2 MG IJ SOLR: 2.0000 mg | Freq: Once | INTRAMUSCULAR | Status: DC | PRN

## 2020-10-04 MED ORDER — HYDROMORPHONE HCL 1 MG/ML IJ SOLN
0.5000 mg | INTRAMUSCULAR | Status: DC | PRN
  Administered 2020-10-04 – 2020-10-06 (×7): 0.5 mg via INTRAVENOUS
  Filled 2020-10-04 (×7): qty 0.5

## 2020-10-04 NOTE — Procedures (Signed)
I was present at this dialysis session. I have reviewed the session itself and made appropriate changes.   AM labs pending.  Up on EDW, UF goal 6.5L. TDC. 2K bath.  SNF dispo remains unresolved.   Filed Weights   09/30/20 1100 10/02/20 0715 10/04/20 0800  Weight: 73.8 kg 80.4 kg 84.9 kg    Recent Labs  Lab 10/02/20 0838  NA 133*  K 6.2*  CL 96*  CO2 28  GLUCOSE 152*  BUN 39*  CREATININE 6.63*  CALCIUM 8.6*  PHOS 3.9    Recent Labs  Lab 09/28/20 0902 09/30/20 0938 10/02/20 0838  WBC 10.8* 11.6* 10.1  HGB 8.0* 7.5* 7.2*  HCT 27.9* 24.9* 23.8*  MCV 97.2 97.3 97.5  PLT 296 288 301    Scheduled Meds: . (feeding supplement) PROSource Plus  30 mL Oral TID BM  . calcitRIOL  2.5 mcg Oral Q M,W,F-HD  . Chlorhexidine Gluconate Cloth  6 each Topical Q0600  . Chlorhexidine Gluconate Cloth  6 each Topical Q0600  . cinacalcet  60 mg Oral Q breakfast  . darbepoetin (ARANESP) injection - DIALYSIS  150 mcg Intravenous Q Fri-HD  . fentaNYL  1 patch Transdermal Q72H  . heparin  5,000 Units Subcutaneous Q8H  . insulin aspart  0-6 Units Subcutaneous TID WC  . multivitamin  1 tablet Oral QHS  . sevelamer carbonate  4,000 mg Oral TID WC   Continuous Infusions: PRN Meds:.albuterol, haloperidol **OR** haloperidol lactate, HYDROmorphone (DILAUDID) injection, oxyCODONE, sevelamer carbonate   Pearson Grippe  MD 10/04/2020, 8:29 AM

## 2020-10-04 NOTE — TOC Progression Note (Signed)
Transition of Care [facility]) - Progression Note    Patient Details  Name: [name] MRN: [id number] Date of Birth: [birth year]  Transition of Care [facility]) CM/SW Contact  Bartholomew Crews, RN Phone Number: [phone] 10/04/2020, 12:35 PM  Clinical Narrative:     Spoke with patient at the bedside. He has accepted bed offer for [facility]. Spoke with [name] at [facility] to confirm that [place] location has bed to offer. Patient will need transient seat for outpatient hemodialysis set up at [facility]. Spoke with Marya Amsler (covering renal navigator) to assist with outpatient dialysis placement. Outpatient hemodialysis pending. No insurance authorization needed now d/t waiver initiated as a result of recent surge in pandemic. TOC following for transition needs.   Expected Discharge Plan: Home/Self Care Barriers to Discharge: Continued Medical Work up  Expected Discharge Plan and Services Expected Discharge Plan: Home/Self Care   Discharge Planning Services: CM Consult     Expected Discharge Date: 09/28/20                                     Social Determinants of Health (SDOH) Interventions    Readmission Risk Interventions No flowsheet data found.

## 2020-10-04 NOTE — Progress Notes (Signed)
PROGRESS NOTE  [name] YBO:[id number] DOB: 1980-10-29 DOA: 09/10/2020 PCP: Kerin Perna, NP   LOS: 23 days   Brief narrative: As per HPI,  40 year old with a history of ESRD on hemodialysis, DM 2, HTN, and a left foot diabetic ulcer who presented to the ED with complaints of pain in the left hand with erythema and swelling as well as warmth of the left foot.  He had been following podiatry as outpatient.  Patient was then admitted hospital for acute osteomyelitis involving the ankle on the foot on the left with sepsis. During this hospitalization, he  required a left BKA as well as I&D of a left wrist abscess.  Hospital course was prolonged due to his medical comorbidities and placement difficulty.  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  Patient has completed course of antibiotics.  Continue wound care. Vascular surgery on board.  Left leg phantom pain.  IV narcotic seeking tendency. Will transition to fentanyl patch and oral oxycodone will be increased, decreased frequency of dilaudid.  Patient is very resistant on changing but is willing to try.  Left wrist abscess Status post I&D on 12/19 -MSSA on abscess culture,  completed antibiotic course.  Continue  to wet-to-dry dressings as per hand surgery/orthopedics.  Also needed hydrotherapy.  Improving.  ESRD on hemodialysis-M/W/F with hyperkalemia today.  Nephrology  order plan was to obtain AV graft on 1228 but patient had refused.  For hemodialysis today. Continue hemodialysis while in hospital. Continue HD on discharge.  Uncontrolled DM2 Off Lantus at this time due to episodes of hypoglycemia.  Continue sliding scale insulin, Accu-Cheks diabetic diet.  Labile blood glucose control.Latest blood glucose of 175  Anemia of chronic kidney disease Has received 1 unit of packed RBC with hemodialysis during this admission.  Monitor and transfuse as necessary. Latest hemoglobin of 7.2  Possible bipolar disorder Patient exhibited intermittent increasing psychosis and threatening behavior during hospitalization and required IVC.  Psychiatry was consulted - was not a candidate for inpt tx   Moderate malnutrition in context of chronic illness Dietary on board. Encourage nutrition.  Disposition difficulty Patient with lack of  assistance at home.  Need for skilled nursing facility.  Medically stable at this time.  Awaiting for placement.  DVT prophylaxis: SCD's Start: 09/15/20 0359 heparin injection 5,000 Units Start: 09/11/20 0600  Code Status: Full code  Family Communication: None  Status is: Inpatient  Remains inpatient appropriate because:Unsafe d/c plan  Dispo:  Patient From: Home  Planned Disposition: [place]  Expected discharge date: 10/07/2020 or when bed is available  Medically stable for discharge: Yes  Consultants: Vascular surgery Nephrology Hand surgery  Procedures:  Incision and drainage of wrist abscess on 09/15/20  Left BKA on 09/16/2020  Hemodialysis  Antibiotics:  . None currently  Subjective: Today, patient was seen and examined at bedside during hemodialysis.  Spoke about the pain.  Denies any shortness of breath cough fever or chills.  Moderate pain  on the stump   Objective: Vitals:   10/03/20 1931 10/04/20 0300  BP: 101/86 140/80  Pulse: 97 90  Resp: 16 17  Temp: 98.1 F (36.7 C) 98.2 F  (36.8 C)  SpO2: 100% 100%    Intake/Output Summary (Last 24 hours) at 10/04/2020 0746 Last data filed at 10/03/2020 1700 Gross per 24 hour  Intake 1080 ml  Output -  Net 1080 ml   Filed Weights   09/30/20 0715 09/30/20 1100 10/02/20 0715  Weight: 77.2 kg 73.8 kg 80.4 kg   Body mass index is 27.76 kg/m.   Physical Exam: General:  Average built, not in obvious distress alert awake oriented, pressurized speech HENT:   No scleral pallor or icterus noted. Oral mucosa is moist.  Chest:  Clear breath sounds.  Diminished breath sounds bilaterally. No crackles or wheezes.  CVS: S1 &S2 heard. No murmur.  Regular rate and rhythm. Abdomen: Soft, nontender, nondistended.  Bowel sounds are heard.   Extremities:  Status post left below-knee amputation wrapped.  Left wrist with dressing Psych: Alert, awake and oriented, normal mood CNS:  No cranial nerve deficits.  Power equal in all extremities.   Skin: Warm and dry.     Data Review: I have personally reviewed the following laboratory data and studies,  CBC: Recent Labs  Lab 09/28/20 0902 09/30/20 0938 10/02/20 0838  WBC 10.8* 11.6* 10.1  HGB 8.0* 7.5* 7.2*  HCT 27.9* 24.9* 23.8*  MCV 97.2 97.3 97.5  PLT 296 288 111   Basic Metabolic Panel: Recent Labs  Lab 09/28/20 0905 09/30/20 0939 10/02/20 0838  NA 130* 132* 133*  K 5.4* 5.9* 6.2*  CL 93* 97* 96*  CO2 24 26 28   GLUCOSE 409* 299* 152*  BUN 31* 37* 39*  CREATININE 6.45* 6.64* 6.63*  CALCIUM 8.2* 7.7* 8.6*  PHOS 4.4 3.5 3.9   Liver Function Tests: Recent Labs  Lab 09/28/20 0905 09/30/20 0939 10/02/20 0838  ALBUMIN 1.8* 1.7* 1.9*   No results for input(s): LIPASE, AMYLASE in the last 168 hours. No results for input(s): AMMONIA in the last 168 hours. Cardiac Enzymes: No results for input(s): CKTOTAL, CKMB, CKMBINDEX, TROPONINI in the last 168 hours. BNP (last 3 results) No results for input(s): BNP in the last 8760 hours.  ProBNP (last 3 results) No results  for input(s): PROBNP in the last 8760 hours.  CBG: Recent Labs  Lab 10/02/20 1956 10/03/20 0801 10/03/20 1627 10/03/20 1935 10/04/20 0629  GLUCAP 75 219* 231* 60* 175*   No results found for this or any previous visit (from the past 240 hour(s)).   Studies: No results found.    Flora Lipps, MD  [facility] 10/04/2020  If 7PM-7AM, please contact night-coverage

## 2020-10-04 NOTE — Plan of Care (Signed)

## 2020-10-04 NOTE — Progress Notes (Signed)
Physical Therapy Treatment Patient Details Name: [name] MRN: [id number] DOB: 12-30-1980 Today's Date: 10/04/2020    History of Present Illness Patient is a 40 y/o male with PMH significant for DM Type 1, gastroparesis, HTN, ESRD, chronic L foot diabetic wound infection. Patient admitted for sepsis 2/2 acute osteomyelitis involving L ankle and foot. 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    PT Comments    Patient agitated on arrival, mood improved by end of session. Patient ambulated 66' with RW and supervision for safety, no apparent LOB. Patient performed single leg squat x 10 with B UE support of RW. Patient continues to be limited by decreased activity tolerance, impaired balance, generalized weakness, and impaired functional mobility. Continue to recommend SNF for ongoing Physical Therapy.       Follow Up Recommendations  SNF;Supervision/Assistance - 24 hour     Equipment Recommendations  Rolling [employer] Fees with 5" wheels;3in1 (PT);Wheelchair (measurements PT);Wheelchair cushion (measurements PT)    Recommendations for Other Services       Precautions / Restrictions Precautions Precautions: Fall Precaution Comments: 3+ falls during this admission Restrictions Weight Bearing Restrictions: Yes LLE Weight Bearing: Non weight bearing    Mobility  Bed Mobility Overal bed mobility: Modified Independent                Transfers Overall transfer level: Needs assistance Equipment used: Rolling Solyana Nonaka (2 wheeled) Transfers: Sit to/from Stand Sit to Stand: Supervision         General transfer comment: supervision for safety, refuses physical assistance  Ambulation/Gait Ambulation/Gait assistance: Supervision Gait Distance (Feet): 30 Feet Assistive device: Rolling Tashai Catino (2 wheeled) Gait Pattern/deviations: Step-to pattern;Decreased stride length     General Gait Details: supervision for safety, refuses to be touched. No apparent  LOB   Stairs             Wheelchair Mobility    Modified Rankin (Stroke Patients Only)       Balance Overall balance assessment: Needs assistance Sitting-balance support: No upper extremity supported Sitting balance-[name]: Good     Standing balance support: Bilateral upper extremity supported;During functional activity Standing balance-[name]: Poor Standing balance comment: reliant on UE support                            Cognition Arousal/Alertness: Awake/alert Behavior During Therapy: WFL for tasks assessed/performed Overall Cognitive Status: Impaired/Different from baseline Area of Impairment: Memory;Safety/judgement                     Memory: Decreased short-term memory   Safety/Judgement: Decreased awareness of safety;Decreased awareness of deficits            Exercises Other Exercises Other Exercises: Single leg squat x 10 with B UE support of RW    General Comments        Pertinent Vitals/Pain Pain Assessment: Faces Faces Pain Scale: No hurt    Home Living                      Prior Function            PT Goals (current goals can now be found in the care plan section) Acute Rehab PT Goals Patient Stated Goal: to go home PT Goal Formulation: With patient Time For Goal Achievement: 10/11/20 Potential to Achieve Goals: Good Progress towards PT goals: Progressing toward goals    Frequency  Min 3X/week      PT Plan Current plan remains appropriate    Co-evaluation              AM-PAC PT "6 Clicks" Mobility   Outcome Measure  Help needed turning from your back to your side while in a flat bed without using bedrails?: None Help needed moving from lying on your back to sitting on the side of a flat bed without using bedrails?: None Help needed moving to and from a bed to a chair (including a wheelchair)?: A Little Help needed standing up from a chair using your arms (e.g., wheelchair  or bedside chair)?: A Little Help needed to walk in hospital room?: A Little Help needed climbing 3-5 steps with a railing? : A Lot 6 Click Score: 19    End of Session   Activity Tolerance: Patient tolerated treatment well Patient left: in bed;with call bell/phone within reach Nurse Communication: Mobility status PT Visit Diagnosis: Unsteadiness on feet (R26.81);Other abnormalities of gait and mobility (R26.89);Muscle weakness (generalized) (M62.81);Difficulty in walking, not elsewhere classified (R26.2)     Time: 2919-1660 PT Time Calculation (min) (ACUTE ONLY): 32 min  Charges:  $Therapeutic Activity: 23-37 mins                     Richetta Cubillos A. Gilford Rile PT, DPT Acute Rehabilitation Services Pager [phone] Office [phone]    Alda Lea 10/04/2020, 2:47 PM

## 2020-10-05 DIAGNOSIS — I10 Essential (primary) hypertension: Secondary | ICD-10-CM | POA: Diagnosis not present

## 2020-10-05 DIAGNOSIS — N186 End stage renal disease: Secondary | ICD-10-CM | POA: Diagnosis not present

## 2020-10-05 DIAGNOSIS — E08621 Diabetes mellitus due to underlying condition with foot ulcer: Secondary | ICD-10-CM | POA: Diagnosis not present

## 2020-10-05 DIAGNOSIS — M86172 Other acute osteomyelitis, left ankle and foot: Secondary | ICD-10-CM | POA: Diagnosis not present

## 2020-10-05 LAB — BASIC METABOLIC PANEL
Anion gap: 10 (ref 5–15)
BUN: 32 mg/dL — ABNORMAL HIGH (ref 6–20)
CO2: 27 mmol/L (ref 22–32)
Calcium: 9.2 mg/dL (ref 8.9–10.3)
Chloride: 99 mmol/L (ref 98–111)
Creatinine, Ser: 5.43 mg/dL — ABNORMAL HIGH (ref 0.61–1.24)
GFR, Estimated: 13 mL/min — ABNORMAL LOW (ref >60)
Glucose, Bld: 177 mg/dL — ABNORMAL HIGH (ref 70–99)
Potassium: 6.3 mmol/L (ref 3.5–5.1)
Sodium: 136 mmol/L (ref 135–145)

## 2020-10-05 LAB — CBC
HCT: 23.9 % — ABNORMAL LOW (ref 39.0–52.0)
Hemoglobin: 7.1 g/dL — ABNORMAL LOW (ref 13.0–17.0)
MCH: 29.3 pg (ref 26.0–34.0)
MCHC: 29.7 g/dL — ABNORMAL LOW (ref 30.0–36.0)
MCV: 98.8 fL (ref 80.0–100.0)
Platelets: 236 10*3/uL (ref 150–400)
RBC: 2.42 MIL/uL — ABNORMAL LOW (ref 4.22–5.81)
RDW: 16.9 % — ABNORMAL HIGH (ref 11.5–15.5)
WBC: 9.5 10*3/uL (ref 4.0–10.5)
nRBC: 0 % (ref 0.0–0.2)

## 2020-10-05 LAB — GLUCOSE, CAPILLARY
Glucose-Capillary: 195 mg/dL — ABNORMAL HIGH (ref 70–99)
Glucose-Capillary: 254 mg/dL — ABNORMAL HIGH (ref 70–99)
Glucose-Capillary: 280 mg/dL — ABNORMAL HIGH (ref 70–99)
Glucose-Capillary: 263 mg/dL — ABNORMAL HIGH (ref 70–99)
Glucose-Capillary: 145 mg/dL — ABNORMAL HIGH (ref 70–99)

## 2020-10-05 LAB — MAGNESIUM: Magnesium: 2.4 mg/dL (ref 1.7–2.4)

## 2020-10-05 MED ORDER — OXYCODONE HCL 5 MG PO TABS
20.0000 mg | ORAL_TABLET | ORAL | Status: DC | PRN
  Administered 2020-10-05 – 2020-10-11 (×25): 20 mg via ORAL
  Filled 2020-10-05 (×26): qty 4

## 2020-10-05 MED ORDER — SODIUM ZIRCONIUM CYCLOSILICATE 10 G PO PACK
10.0000 g | PACK | Freq: Two times a day (BID) | ORAL | Status: AC
  Administered 2020-10-05 – 2020-10-06 (×4): 10 g via ORAL
  Filled 2020-10-05 (×4): qty 1

## 2020-10-05 NOTE — Progress Notes (Signed)
[facility] Progress Note   Subjective:  Seen in room. Mother at bedside. Completed dialysis yesterday with 5.5L. UF  Unfortunately K+ 6.3 this am. No cp, dyspnea.    Objective Vitals:   10/04/20 1647 10/04/20 1947 10/05/20 0252 10/05/20 0843  BP: (!) 123/46 98/65 (!) 108/43 130/76  Pulse: 100 100  92  Resp: 16 16 16    Temp: 98.3 F (36.8 C) 98.5 F (36.9 C) (!) 97.2 F (36.2 C)   TempSrc: Oral Oral Oral   SpO2: 98% 100% 100% 100%  Weight:      Height:       Physical Exam General: Well appearing man, NAD. Room air. Heart: RRR, no murmur Lungs: CTA bilaterally without wheezing, rhonchi or rales Abdomen: Soft, non-tender Extremities: No RLE edema, L stump bandaged without edema Dialysis Access: R IJ [facility]  Additional Objective Labs: Basic Metabolic Panel: Recent Labs  Lab 09/30/20 0939 10/02/20 0838 10/04/20 0619 10/05/20 0159  NA 132* 133* 133* 136  K 5.9* 6.2* 6.7* 6.3*  CL 97* 96* 98 99  CO2 26 28 27 27   GLUCOSE 299* 152* 154* 177*  BUN 37* 39* 46* 32*  CREATININE 6.64* 6.63* 6.72* 5.43*  CALCIUM 7.7* 8.6* 8.8* 9.2  PHOS 3.5 3.9 4.1  --    Liver Function Tests: Recent Labs  Lab 09/30/20 0939 10/02/20 0838 10/04/20 0619  ALBUMIN 1.7* 1.9* 2.0*   CBC: Recent Labs  Lab 09/30/20 0938 10/02/20 0838 10/04/20 0619 10/05/20 0159  WBC 11.6* 10.1 9.8 9.5  HGB 7.5* 7.2* 7.0* 7.1*  HCT 24.9* 23.8* 23.9* 23.9*  MCV 97.3 97.5 99.6 98.8  PLT 288 301 292 236   Medications:  . (feeding supplement) PROSource Plus  30 mL Oral TID BM  . calcitRIOL  2.5 mcg Oral Q M,W,F-HD  . Chlorhexidine Gluconate Cloth  6 each Topical Q0600  . Chlorhexidine Gluconate Cloth  6 each Topical Q0600  . cinacalcet  60 mg Oral Q breakfast  . darbepoetin (ARANESP) injection - DIALYSIS  150 mcg Intravenous Q Fri-HD  . fentaNYL  1 patch Transdermal Q72H  . heparin  5,000 Units Subcutaneous Q8H  . insulin aspart  0-6 Units Subcutaneous TID WC  . multivitamin  1 tablet  Oral QHS  . sevelamer carbonate  4,000 mg Oral TID WC  . sodium zirconium cyclosilicate  10 g Oral BID   Dialysis Orders: MWF @ [facility] 4h 40min 400/800 67kg TDC Hep 5000 + 5000 midrun - Calcitriol 24mcg PO q HD - No ESA ** Scheduled for R BC AVF on 09/24/20, canceled/delayed per patient request.  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 Finished Cefazolin. Has small area of wound dehiscence - per primary team. 2. Hyperkalemia. Dialyzed 1/7. K+ 6.3 this am. Will order Lokelma 10  BID. Trend labs.  3. ESRD:Usual MWF schedule - plan for 1/10. Follow labs as above.  4.HTN/volume:Up on weight, drinking too much - UF as tolerated.  5. Anemia:S/p1U PRBCs on 12/27 and Aranesp dose increased to 150 q Friday.  Hgb improved now falling again. FOBT negative. Transfuse prn 6. Secondary hyperparathyroidism:CorrCa/Phos good- continue binders/VDRA/sensipar. 7. Nutrition:Alb low - continue protein supplements. 8. Bipolar disorder + underlying personality disorder:Severe aggression earlier this admit - improved slowly. Psych consulted on 12/24 - did not meet criteria for inpatient psych. 9.L wrist abscess: S/p I&D 12/19 - now s/p antibiotic course. 10. T2DM: Chronically uncontrolled. 11: Itching/papules:Chronic per pt. Pruritis can be due to chronically high phosphorus/uremia. If persistent, outpatient dermatology  consult may be appropriate  Dispo: Plan is d/c to SNF/rehab - pending.  Lynnda Child PA-C [facility] 10/05/2020,10:18 AM

## 2020-10-05 NOTE — Progress Notes (Signed)
PROGRESS NOTE  [name] KXF:[id number] DOB: 03/10/1981 DOA: 09/10/2020 PCP: Kerin Perna, NP   LOS: 24 days   Brief narrative: As per HPI,  40 year old with a history of ESRD on hemodialysis, DM 2, HTN, and a left foot diabetic ulcer who presented to the ED with complaints of pain in the left hand with erythema and swelling as well as warmth of the left foot.  He had been following podiatry as outpatient.  Patient was then admitted hospital for acute osteomyelitis involving the ankle on the foot on the left with sepsis. During this hospitalization, he  required a left BKA as well as I&D of a left wrist abscess.  Prolonged hospital stay due to difficult placement and uncontrolled pain.  Assessment/Plan:  Principal Problem:   Adjustment disorder, unspecified Active Problems:   Insulin dependent type 1 diabetes mellitus ([name])   ESRD on dialysis ([place])   HTN (hypertension)   Sepsis ([place])   Acute osteomyelitis involving ankle and foot, left ([place])   Diabetic ulcer of left midfoot associated with diabetes mellitus due to underlying condition, with bone involvement without evidence of necrosis ([place])   Osteomyelitis of left foot ([place])   Malnutrition of moderate degree  Acute on chronic Enterococcus and Proteus osteomyelitis left foot/tibia with sepsis 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  Patient has completed course of antibiotics.  Continue wound care. Vascular surgery on board.  Left leg phantom pain.  IV narcotic seeking tendency. Fentanyl patch started 1/7, oxycodone PRN and Dilaudid PRN.  Patient very upset about any changes. Agreed to increasing oxycodone with plan to space out dilaudid on 1/9. Discussed non-narcotic options of Tylenol standing + Gabapentin which he declined.    Left wrist abscess Status post I&D on 12/19 -MSSA on abscess culture,  completed antibiotic course.  Continue  to wet-to-dry dressings as per hand surgery/orthopedics.  Also needed hydrotherapy.   Improving.  ESRD on hemodialysis-M/W/F with hyperkalemia today.  Nephrology order plan was to obtain AV graft on 1228 but patient had refused.  For hemodialysis today. Continue hemodialysis while in hospital. Continue HD on discharge. Will need to transition HD sites for SNF, appreciate SW input.   Hyperkalemia- EKG, lokelma.   Uncontrolled DM2, per chart and patient may be type 1 diabetic. Would clarify. May need long acting.  Off Lantus at this time due to episodes of hypoglycemia.  Continue sliding scale insulin, Accu-Cheks diabetic diet.  Labile blood glucose control.Latest blood glucose of 175  Anemia of chronic kidney disease Has received 1 unit of packed RBC with hemodialysis during this admission.  Monitor and transfuse as necessary. Latest hemoglobin of 7.1  Possible bipolar disorder Patient exhibited intermittent increasing psychosis and threatening behavior during hospitalization and required IVC.  Psychiatry was consulted - was not a candidate for inpt tx   Moderate malnutrition in context of chronic illness Dietary on board. Encourage nutrition.  Disposition difficulty Patient with lack of  assistance at home.  Need for skilled nursing facility.  Medically stable at this time.  Awaiting for placement.  DVT prophylaxis: SCD's Start: 09/15/20 0359 heparin injection 5,000 Units Start: 09/11/20 0600  Code Status: Full code  Family Communication: None  Status is: Inpatient  Remains inpatient appropriate because:Unsafe d/c plan  Dispo:  Patient From: Home  Planned Disposition: [place]  Expected discharge date: 10/07/2020 or when bed is available  Medically stable for discharge: Yes  Consultants: Vascular surgery Nephrology Hand surgery  Procedures:  Incision and drainage of wrist abscess  on 09/15/20  Left BKA on 09/16/2020  Hemodialysis  Antibiotics:  . None currently  Subjective: Today, patient was seen and examined at bedside during  hemodialysis.  Spoke about the pain.  Denies any shortness of breath cough fever or chills.  Moderate pain on the stump   Objective: Vitals:   10/05/20 0252 10/05/20 0843  BP: (!) 108/43 130/76  Pulse:  92  Resp: 16 16  Temp: (!) 97.2 F (36.2 C) (!) 97.4 F (36.3 C)  SpO2: 100% 100%    Intake/Output Summary (Last 24 hours) at 10/05/2020 1505 Last data filed at 10/05/2020 0900 Gross per 24 hour  Intake 240 ml  Output --  Net 240 ml   Filed Weights   10/02/20 0715 10/04/20 0800 10/04/20 1140  Weight: 80.4 kg 84.9 kg 79.4 kg   Body mass index is 27.42 kg/m.   Physical Exam: General:  Talkative, alert, appropriate  HENT:   No scleral pallor or icterus noted. Oral mucosa is moist.  Chest:  Clear breath sounds.  Diminished breath sounds bilaterally. No crackles or wheezes.  CVS: S1 &S2 heard. No murmur.  Regular rate and rhythm. Abdomen: Soft, nontender, nondistended.  Bowel sounds are heard.   Extremities:  Status post left below-knee amputation wrapped.  Left wrist with dressing Psych: Alert, awake and oriented, normal mood CNS:  No cranial nerve deficits.  Power equal in all extremities.   Skin: Warm and dry.     Data Review: I have personally reviewed the following laboratory data and studies,  CBC: Recent Labs  Lab 09/30/20 0938 10/02/20 0838 10/04/20 0619 10/05/20 0159  WBC 11.6* 10.1 9.8 9.5  HGB 7.5* 7.2* 7.0* 7.1*  HCT 24.9* 23.8* 23.9* 23.9*  MCV 97.3 97.5 99.6 98.8  PLT 288 301 292 408   Basic Metabolic Panel: Recent Labs  Lab 09/30/20 0939 10/02/20 0838 10/04/20 0619 10/05/20 0159  NA 132* 133* 133* 136  K 5.9* 6.2* 6.7* 6.3*  CL 97* 96* 98 99  CO2 26 28 27 27   GLUCOSE 299* 152* 154* 177*  BUN 37* 39* 46* 32*  CREATININE 6.64* 6.63* 6.72* 5.43*  CALCIUM 7.7* 8.6* 8.8* 9.2  MG  --   --   --  2.4  PHOS 3.5 3.9 4.1  --    Liver Function Tests: Recent Labs  Lab 09/30/20 0939 10/02/20 0838 10/04/20 0619  ALBUMIN 1.7* 1.9* 2.0*   No  results for input(s): LIPASE, AMYLASE in the last 168 hours. No results for input(s): AMMONIA in the last 168 hours. Cardiac Enzymes: No results for input(s): CKTOTAL, CKMB, CKMBINDEX, TROPONINI in the last 168 hours. BNP (last 3 results) No results for input(s): BNP in the last 8760 hours.  ProBNP (last 3 results) No results for input(s): PROBNP in the last 8760 hours.  CBG: Recent Labs  Lab 10/04/20 1242 10/04/20 1644 10/04/20 1951 10/05/20 0701 10/05/20 1127  GLUCAP 165* 343* 96 195* 254*   No results found for this or any previous visit (from the past 240 hour(s)).   Studies: No results found.    Martyn Malay, MD  [facility] 10/05/2020  If 7PM-7AM, please contact night-coverage

## 2020-10-05 NOTE — Plan of Care (Signed)

## 2020-10-05 NOTE — Progress Notes (Signed)
The patient continues to want the nurse to leave medications at his bedside.  He was educated that, it is against policy to leave medications at the bedside and the nurse must watch the patient take the medication.  Reports that he understands but does not plan to adhere to the directions.  The patient has been pleasant and cooperative at other times today.

## 2020-10-05 NOTE — Progress Notes (Signed)
CRITICAL LAB VALUE: K+ 6.3   Dr Jamse Arn was sent secured chat message in regards to critical value.

## 2020-10-06 DIAGNOSIS — R52 Pain, unspecified: Secondary | ICD-10-CM | POA: Diagnosis not present

## 2020-10-06 DIAGNOSIS — I10 Essential (primary) hypertension: Secondary | ICD-10-CM | POA: Diagnosis not present

## 2020-10-06 LAB — RENAL FUNCTION PANEL
Albumin: 2.3 g/dL — ABNORMAL LOW (ref 3.5–5.0)
Anion gap: 15 (ref 5–15)
BUN: 62 mg/dL — ABNORMAL HIGH (ref 6–20)
CO2: 20 mmol/L — ABNORMAL LOW (ref 22–32)
Calcium: 9.2 mg/dL (ref 8.9–10.3)
Chloride: 95 mmol/L — ABNORMAL LOW (ref 98–111)
Creatinine, Ser: 7.17 mg/dL — ABNORMAL HIGH (ref 0.61–1.24)
GFR, Estimated: 9 mL/min — ABNORMAL LOW (ref >60)
Glucose, Bld: 289 mg/dL — ABNORMAL HIGH (ref 70–99)
Phosphorus: 4.8 mg/dL — ABNORMAL HIGH (ref 2.5–4.6)
Potassium: >7.5 mmol/L (ref 3.5–5.1)
Sodium: 130 mmol/L — ABNORMAL LOW (ref 135–145)

## 2020-10-06 LAB — GLUCOSE, CAPILLARY
Glucose-Capillary: 120 mg/dL — ABNORMAL HIGH (ref 70–99)
Glucose-Capillary: 233 mg/dL — ABNORMAL HIGH (ref 70–99)
Glucose-Capillary: 307 mg/dL — ABNORMAL HIGH (ref 70–99)
Glucose-Capillary: 128 mg/dL — ABNORMAL HIGH (ref 70–99)
Glucose-Capillary: 386 mg/dL — ABNORMAL HIGH (ref 70–99)

## 2020-10-06 MED ORDER — DEXTROSE 50 % IV SOLN
1.0000 | Freq: Once | INTRAVENOUS | Status: AC
  Administered 2020-10-06: 50 mL via INTRAVENOUS
  Filled 2020-10-06: qty 50

## 2020-10-06 MED ORDER — CALCIUM GLUCONATE-NACL 2-0.675 GM/100ML-% IV SOLN
2.0000 g | Freq: Once | INTRAVENOUS | Status: AC
  Administered 2020-10-06: 2000 mg via INTRAVENOUS
  Filled 2020-10-06: qty 100

## 2020-10-06 MED ORDER — HYDROMORPHONE HCL 1 MG/ML IJ SOLN
0.5000 mg | Freq: Four times a day (QID) | INTRAMUSCULAR | Status: DC | PRN
  Administered 2020-10-06 – 2020-10-11 (×10): 0.5 mg via INTRAVENOUS
  Filled 2020-10-06 (×11): qty 0.5

## 2020-10-06 MED ORDER — HEPARIN SODIUM (PORCINE) 1000 UNIT/ML IJ SOLN
INTRAMUSCULAR | Status: AC
  Filled 2020-10-06: qty 4

## 2020-10-06 MED ORDER — INSULIN ASPART 100 UNIT/ML IV SOLN
10.0000 [IU] | Freq: Once | INTRAVENOUS | Status: AC
  Administered 2020-10-06: 10 [IU] via INTRAVENOUS

## 2020-10-06 MED ORDER — HYDROMORPHONE HCL 1 MG/ML IJ SOLN
INTRAMUSCULAR | Status: AC
  Administered 2020-10-06: 0.5 mg via INTRAVENOUS
  Filled 2020-10-06: qty 0.5

## 2020-10-06 NOTE — Plan of Care (Signed)

## 2020-10-06 NOTE — Progress Notes (Signed)
Patient transported to dialysis via bed- Seth Bake, RN is aware of IV medications ordered and sent with the patient.

## 2020-10-06 NOTE — Progress Notes (Signed)
I have spent an extended amount of time with the patient this morning ( 1 hour)  He has multiple complaints regarding staff situations, with prior nurse/shift  and request that he talk to Ivin Booty or Manuela Schwartz.  I have explained to him that they will be here on Monday.  The patient appears to be in a very intense mood and not receptive to any explanations or reasoning.  He becomes defensive when I tell him that I have to leave and go attend other patients.He reports that he always has his phone on with his family or friends to listen or watch staff during conversations because he states that he has trust issues.  He reports that he does not like that his nurse is also the Charge nurse today.  I have encouraged him to talk with the Director or AD tomorrow.  The PA is at the bedside at this time. No distress noted.He has been medicated for pain as ordered and given PO pain medication first.

## 2020-10-06 NOTE — Progress Notes (Signed)
Dr Jamse Arn is aware of critical lab value: K+ 7.5  Vanessa DuBois, PA was sent a secure message in regards to critical lab value

## 2020-10-06 NOTE — Progress Notes (Signed)
PROGRESS NOTE    [name]  IOE:[id number]  DOB: 07-Jul-1981  DOA: 09/10/2020 PCP: Kerin Perna, NP Outpatient Specialists:   Hospital course:  40 year old male with ESRD on HD, bipolar disorder, narcotic seeking behavior, DM 2, HTN who was admitted 09/10/2020 with acute osteomyelitis of left ankle and foot with sepsis.  Course has been prolonged and patient underwent left BKA and I&D of left wrist abscess.  Hospital stay has been prolonged due to difficult placement due to uncontrolled pain/narcotic seeking behavior and need for SNF bed near dialysis center.   Subjective:  Patient states that he does not want to hear about his potassium because he has had high potassiums in the past and he knows all about it.  He says he will not tolerate telemetry as high potassiums do not bother him.  States he does not want to hear about how dangerous it is because he has been through this before.  Patient is very upset that his Dilaudid is to be cut down as discussed yesterday.  Patient states he does not know if the fentanyl patch is working and he does not want his Dilaudid to be decreased.  States he understands that he needs to come off the IV narcotics as he cannot be discharged on days.  He understands that that is why the vaginal patch was started.  He admits that he did agree to cut down on the Dilaudid frequency yesterday however he does not want to have it done.     Objective: Vitals:   10/06/20 1307 10/06/20 1322 10/06/20 1337 10/06/20 1407  BP: 109/75 106/65 (!) 107/44 (!) 112/51  Pulse:      Resp: 18 14 14 14   Temp:      TempSrc:      SpO2:      Weight:      Height:       No intake or output data in the 24 hours ending 10/06/20 1600 Filed Weights   10/04/20 0800 10/04/20 1140 10/06/20 1245  Weight: 84.9 kg 79.4 kg 83.6 kg     Exam:  General: Belligerent chronically ill-appearing male looking much older than stated age sitting up in bed in no acute distress  moving comfortably. Eyes: sclera anicteric, conjuctiva mild injection bilaterally CVS: S1-S2, regular  Respiratory:  decreased air entry bilaterally secondary to decreased inspiratory effort, rales at bases  GI: NABS, soft, NT  LE: Status post left BKA, CDI.  Left wrist with CDI. Neuro:  grossly nonfocal.  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   Assessment & Plan:   40 year old man with ESRD, bipolar disorder, narcotic seeking behavior is awaiting placement at SNF near a hemodialysis facility.  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  Per note from yesterday, he agreed to space out his Dilaudid starting today. We will decrease Dilaudid to 0.5 mg every 6 hours down from every 4 hours. Patient is previously refused Tylenol and gabapentin He is still on high-dose oxycodone Patient would benefit from comprehensive pain management specialist including psychiatric care.   Copied from Dr. 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  Patient has completed course of  antibiotics.  Continue wound care. Vascular surgery on board.  Left wrist abscess Status post I&D on 12/19 -MSSA on abscess culture,  completed antibiotic course.  Continue  to wet-to-dry dressings as per hand surgery/orthopedics.  Also needed hydrotherapy.  Improving.  ESRD on hemodialysis-M/W/F with hyperkalemia today.  Nephrology order plan was to obtain AV graft on 1228 but patient had refused.  For hemodialysis today. Continue hemodialysis while in hospital. Continue HD on discharge. Will need to transition HD  sites for SNF, appreciate SW input.   Uncontrolled DM2, per chart and patient may be type 1 diabetic. Would clarify. May need long acting.  Off Lantus at this time due to episodes of hypoglycemia.  Continue sliding scale insulin, Accu-Cheks diabetic diet.  Labile blood glucose control.Latest blood glucose of 175  Anemia of chronic kidney disease Has received 1 unit of packed RBC with hemodialysis during this admission.  Monitor and transfuse as necessary. Latest hemoglobin of 7.1  Possible bipolar disorder Patient exhibited intermittent increasing psychosis and threatening behavior during hospitalization and required IVC.  Psychiatry was consulted - was not a candidate for inpt tx  Moderate malnutrition in context of chronic illness Dietary on board. Encourage nutrition.  Disposition difficulty Patient with lack of  assistance at home.  Need for skilled nursing facility.  Medically stable at this time.  Awaiting for placement.   DVT prophylaxis: Subcu heparin. Code Status: Full Family Communication: None Disposition Plan:   Patient is from: Home  Anticipated Discharge Location: SNF  Barriers to Discharge: Difficulty finding a bed near hemodialysis center  Is patient medically stable for Discharge: Yes   Consultants:  Nephrology  Vascular surgery  Hand surgery  Procedures:  Incision and drainage of wrist abscess on 09/15/20  Left BKA on 09/16/2020  Hemodialysis  Antibiotics:   None currently    Data Reviewed:  Basic Metabolic Panel: Recent Labs  Lab 09/30/20 0939 10/02/20 0838 10/04/20 0619 10/05/20 0159 10/06/20 0908  NA 132* 133* 133* 136 130*  K 5.9* 6.2* 6.7* 6.3* >7.5*  CL 97* 96* 98 99 95*  CO2 26 28 27 27  20*  GLUCOSE 299* 152* 154* 177* 289*  BUN 37* 39* 46* 32* 62*  CREATININE 6.64* 6.63* 6.72* 5.43* 7.17*  CALCIUM 7.7* 8.6* 8.8* 9.2 9.2  MG  --   --   --  2.4  --   PHOS 3.5 3.9 4.1  --  4.8*   Liver Function Tests: Recent Labs   Lab 09/30/20 0939 10/02/20 0838 10/04/20 0619 10/06/20 0908  ALBUMIN 1.7* 1.9* 2.0* 2.3*   No results for input(s): LIPASE, AMYLASE in the last 168 hours. No results for input(s): AMMONIA in the last 168 hours. CBC: Recent Labs  Lab 09/30/20 0938 10/02/20 0838 10/04/20 0619 10/05/20 0159  WBC 11.6* 10.1 9.8 9.5  HGB 7.5* 7.2* 7.0* 7.1*  HCT 24.9* 23.8* 23.9* 23.9*  MCV 97.3 97.5 99.6 98.8  PLT 288 301 292 236   Cardiac Enzymes: No results for input(s): CKTOTAL, CKMB, CKMBINDEX, TROPONINI in the last 168 hours. BNP (last 3 results) No results for input(s): PROBNP in the last 8760 hours. CBG: Recent Labs  Lab 10/05/20 1652 10/05/20 2125 10/06/20 0417 10/06/20 0711 10/06/20 1157  GLUCAP 263* 145* 120* 233* 307*    No results found for this or any previous visit (from the past 240 hour(s)).    Studies: No results found.   Scheduled Meds: . (feeding supplement) PROSource Plus  30 mL Oral TID BM  . calcitRIOL  2.5 mcg Oral Q M,W,F-HD  . Chlorhexidine Gluconate Cloth  6 each Topical Q0600  . Chlorhexidine Gluconate Cloth  6 each Topical Q0600  . cinacalcet  60 mg Oral Q breakfast  . darbepoetin (ARANESP) injection - DIALYSIS  150 mcg Intravenous Q Fri-HD  . fentaNYL  1 patch Transdermal Q72H  . heparin  5,000 Units Subcutaneous Q8H  . insulin aspart  0-6 Units Subcutaneous TID WC  . multivitamin  1 tablet Oral QHS  . sevelamer carbonate  4,000 mg Oral TID WC  . sodium zirconium cyclosilicate  10 g Oral BID   Continuous Infusions:  Principal Problem:   Adjustment disorder, unspecified Active Problems:   Insulin dependent type 1 diabetes mellitus ([place])   ESRD on dialysis ([place])   HTN (hypertension)   Sepsis ([place]   Acute osteomyelitis involving ankle and foot, left ([place])   Diabetic ulcer of left midfoot associated with diabetes mellitus due to underlying condition, with bone involvement without evidence of necrosis ([place])   Osteomyelitis of left foot  ([place])   Malnutrition of moderate degree     [name], [facility]  If 7PM-7AM, please contact night-coverage [email or web address] Password [email or web address] 10/06/2020, 4:00 PM    LOS: 25 days

## 2020-10-06 NOTE — Progress Notes (Signed)
Dr Jamse Arn ordered continuous cardiac monitor.  The patient refuses cardiac monitoring and states " Fuck her, she is trying to fuck with my medicine" " I am not having a monitor on me".  Dr Jamse Arn was told that he would refuse monitoring.  The patient is aware that he will be planning to go to dialysis today due to his elevated K+ levels.

## 2020-10-06 NOTE — Progress Notes (Signed)
[facility] Progress Note   Subjective:  Seen in room.No SOB but he feels like he has fluid on. Got Lokelma yesterday for hyperkalemia. Initially refusing lab draw, but did get done this am, results pending.   Objective Vitals:   10/05/20 1614 10/05/20 2121 10/06/20 0411 10/06/20 0806  BP: 112/88 110/76 138/70 (!) 144/76  Pulse: 79 98 93 92  Resp: 14 16 16 17   Temp: (!) 97.4 F (36.3 C) 97.9 F (36.6 C) 98 F (36.7 C) 97.8 F (36.6 C)  TempSrc: Oral Oral Oral Oral  SpO2: 100% 100% 100% 99%  Weight:      Height:       Physical Exam General: Well appearing man, NAD. Room air. Heart: RRR, no murmur Lungs: CTA bilaterally without wheezing, rhonchi or rales Abdomen: Soft, non-tender Extremities: No RLE edema, L stump bandaged without edema Dialysis Access: R IJ [facility]  Additional Objective Labs: Basic Metabolic Panel: Recent Labs  Lab 09/30/20 0939 10/02/20 0838 10/04/20 0619 10/05/20 0159  NA 132* 133* 133* 136  K 5.9* 6.2* 6.7* 6.3*  CL 97* 96* 98 99  CO2 26 28 27 27   GLUCOSE 299* 152* 154* 177*  BUN 37* 39* 46* 32*  CREATININE 6.64* 6.63* 6.72* 5.43*  CALCIUM 7.7* 8.6* 8.8* 9.2  PHOS 3.5 3.9 4.1  --    Liver Function Tests: Recent Labs  Lab 09/30/20 0939 10/02/20 0838 10/04/20 0619  ALBUMIN 1.7* 1.9* 2.0*   CBC: Recent Labs  Lab 09/30/20 0938 10/02/20 0838 10/04/20 0619 10/05/20 0159  WBC 11.6* 10.1 9.8 9.5  HGB 7.5* 7.2* 7.0* 7.1*  HCT 24.9* 23.8* 23.9* 23.9*  MCV 97.3 97.5 99.6 98.8  PLT 288 301 292 236   Medications:  . (feeding supplement) PROSource Plus  30 mL Oral TID BM  . calcitRIOL  2.5 mcg Oral Q M,W,F-HD  . Chlorhexidine Gluconate Cloth  6 each Topical Q0600  . Chlorhexidine Gluconate Cloth  6 each Topical Q0600  . cinacalcet  60 mg Oral Q breakfast  . darbepoetin (ARANESP) injection - DIALYSIS  150 mcg Intravenous Q Fri-HD  . fentaNYL  1 patch Transdermal Q72H  . heparin  5,000 Units Subcutaneous Q8H  . insulin  aspart  0-6 Units Subcutaneous TID WC  . multivitamin  1 tablet Oral QHS  . sevelamer carbonate  4,000 mg Oral TID WC  . sodium zirconium cyclosilicate  10 g Oral BID   Dialysis Orders: MWF @ [facility] 4h 63min 400/800 67kg TDC Hep 5000 + 5000 midrun - Calcitriol 16mcg PO q HD - No ESA ** Scheduled for R BC AVF on 09/24/20, canceled/delayed per patient request.  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 Finished Cefazolin. Has small area of wound dehiscence - per primary team. 2. Hyperkalemia. Dialyzed 1/7. K+ 6.3 Started Lokelma 10 BID. Trend labs. HD today if K+ remains high.  3. ESRD:Usual MWF schedule - HD today or tomorrow. Follow labs as above.  4.HTN/volume:Up on weight, drinking too much - UF as tolerated.  5. Anemia:S/p1U PRBCs on 12/27 and Aranesp dose increased to 150 q Friday.  Hgb improved but now falling again. FOBT negative. Transfuse prn 6. Secondary hyperparathyroidism:CorrCa/Phos good- continue binders/VDRA/sensipar. 7. Nutrition:Alb low - continue protein supplements. 8. Bipolar disorder + underlying personality disorder:Severe aggression earlier this admit - improved slowly. Psych consulted on 12/24 - did not meet criteria for inpatient psych. 9.L wrist abscess: S/p I&D 12/19 - now s/p antibiotic course. 10. T2DM: Chronically uncontrolled. 11: Itching/papules:Chronic per  pt. Pruritis can be due to chronically high phosphorus/uremia. If persistent, outpatient dermatology consult may be appropriate  Dispo: Plan is d/c to SNF/rehab - pending.  Lynnda Child PA-C [facility] 10/06/2020,9:56 AM

## 2020-10-06 NOTE — Progress Notes (Signed)
Obtained report for HD. Pt. States he needs to eat before coming to HD and that he has a visitor coming and he was told by 2 doctors that his HD tx would be tonight. This nurse explained to pt. That schedule is already full for tonight's HD tx and he would need to come after eating to complete his HD tx. Pt agrees

## 2020-10-06 NOTE — Progress Notes (Signed)
Pt. Wishes to challenge uf goal. Larina Earthly PA made aware ok to challenge uf goal net 5.5L as tolerated

## 2020-10-07 DIAGNOSIS — I10 Essential (primary) hypertension: Secondary | ICD-10-CM | POA: Diagnosis not present

## 2020-10-07 DIAGNOSIS — N186 End stage renal disease: Secondary | ICD-10-CM | POA: Diagnosis not present

## 2020-10-07 DIAGNOSIS — E08621 Diabetes mellitus due to underlying condition with foot ulcer: Secondary | ICD-10-CM | POA: Diagnosis not present

## 2020-10-07 DIAGNOSIS — M86172 Other acute osteomyelitis, left ankle and foot: Secondary | ICD-10-CM | POA: Diagnosis not present

## 2020-10-07 LAB — GLUCOSE, CAPILLARY
Glucose-Capillary: 229 mg/dL — ABNORMAL HIGH (ref 70–99)
Glucose-Capillary: 154 mg/dL — ABNORMAL HIGH (ref 70–99)
Glucose-Capillary: 218 mg/dL — ABNORMAL HIGH (ref 70–99)
Glucose-Capillary: 330 mg/dL — ABNORMAL HIGH (ref 70–99)

## 2020-10-07 LAB — CBC
HCT: 24.3 % — ABNORMAL LOW (ref 39.0–52.0)
Hemoglobin: 7.2 g/dL — ABNORMAL LOW (ref 13.0–17.0)
MCH: 29.4 pg (ref 26.0–34.0)
MCHC: 29.6 g/dL — ABNORMAL LOW (ref 30.0–36.0)
MCV: 99.2 fL (ref 80.0–100.0)
Platelets: 237 10*3/uL (ref 150–400)
RBC: 2.45 MIL/uL — ABNORMAL LOW (ref 4.22–5.81)
RDW: 16.7 % — ABNORMAL HIGH (ref 11.5–15.5)
WBC: 8.4 10*3/uL (ref 4.0–10.5)
nRBC: 0 % (ref 0.0–0.2)

## 2020-10-07 LAB — RENAL FUNCTION PANEL
Albumin: 2.3 g/dL — ABNORMAL LOW (ref 3.5–5.0)
Anion gap: 14 (ref 5–15)
BUN: 48 mg/dL — ABNORMAL HIGH (ref 6–20)
CO2: 21 mmol/L — ABNORMAL LOW (ref 22–32)
Calcium: 8.9 mg/dL (ref 8.9–10.3)
Chloride: 96 mmol/L — ABNORMAL LOW (ref 98–111)
Creatinine, Ser: 5.76 mg/dL — ABNORMAL HIGH (ref 0.61–1.24)
GFR, Estimated: 12 mL/min — ABNORMAL LOW (ref >60)
Glucose, Bld: 148 mg/dL — ABNORMAL HIGH (ref 70–99)
Phosphorus: 4.1 mg/dL (ref 2.5–4.6)
Potassium: 6.4 mmol/L (ref 3.5–5.1)
Sodium: 131 mmol/L — ABNORMAL LOW (ref 135–145)

## 2020-10-07 MED ORDER — HEPARIN SODIUM (PORCINE) 1000 UNIT/ML IJ SOLN
INTRAMUSCULAR | Status: AC
  Administered 2020-10-07: 1000 [IU]
  Filled 2020-10-07: qty 4

## 2020-10-07 MED ORDER — HEPARIN SODIUM (PORCINE) 1000 UNIT/ML DIALYSIS: 20.0000 [IU]/kg | INTRAMUSCULAR | Status: DC | PRN

## 2020-10-07 MED ORDER — INSULIN ASPART 100 UNIT/ML ~~LOC~~ SOLN
0.0000 [IU] | Freq: Three times a day (TID) | SUBCUTANEOUS | Status: DC
  Administered 2020-10-08: 7 [IU] via SUBCUTANEOUS
  Administered 2020-10-08: 3 [IU] via SUBCUTANEOUS
  Administered 2020-10-09: 1 [IU] via SUBCUTANEOUS
  Administered 2020-10-10: 4 [IU] via SUBCUTANEOUS
  Administered 2020-10-10: 2 [IU] via SUBCUTANEOUS
  Administered 2020-10-10: 3 [IU] via SUBCUTANEOUS

## 2020-10-07 MED ORDER — HYDROMORPHONE HCL 1 MG/ML IJ SOLN
INTRAMUSCULAR | Status: AC
  Administered 2020-10-07: 0.5 mg via INTRAVENOUS
  Filled 2020-10-07: qty 0.5

## 2020-10-07 MED ORDER — INSULIN ASPART 100 UNIT/ML ~~LOC~~ SOLN
0.0000 [IU] | Freq: Every day | SUBCUTANEOUS | Status: DC
  Administered 2020-10-07: 4 [IU] via SUBCUTANEOUS

## 2020-10-07 NOTE — Care Management Important Message (Signed)
Important Message  Patient Details  Name: [name] MRN: [id number] Date of Birth: November 14, 1980   Medicare Important Message Given:  Yes     Daneya Hartgrove P Jatavia Keltner 10/07/2020, 2:25 PM

## 2020-10-07 NOTE — Progress Notes (Signed)
PT Cancellation Note  Patient Details Name: DEBBIE BELLUCCI MRN: [id number] DOB: 01-Aug-1981   Cancelled Treatment:    Reason Eval/Treat Not Completed: Patient at procedure or test/unavailable pt off floor at HD. Will follow.   Marguarite Arbour A Adonus Uselman 10/07/2020, 11:05 AM Marisa Severin, PT, DPT Acute Rehabilitation Services Pager [phone] Office [phone]

## 2020-10-07 NOTE — Progress Notes (Signed)
[facility] Progress Note   Subjective:   Patient seen and examined at bedside.  Wants to have 7L pulled at HD, we agreed on 6L.  Says he is drinking and eating more because he doesn't have anything else to do.  Denies CP, palpitations, SOB, n/v/d, abdominal pain and fatigue.    Objective Vitals:   10/06/20 1712 10/06/20 2200 10/06/20 2210 10/07/20 0802  BP: (!) 137/58 96/76 96/78  (!) 113/91  Pulse: 93 98 98 89  Resp: 16 16 16 14   Temp: 97.6 F (36.4 C) 98.2 F (36.8 C) 98.2 F (36.8 C) 98 F (36.7 C)  TempSrc: Oral Oral Oral Oral  SpO2: 100% 93% 93% 100%  Weight:      Height:       Physical Exam General:Well appearing male in NAD, +facial edema Heart:RRR, no MRG Lungs:CTAB, nml WOB on RA Abdomen:soft, NTND Extremities:1+ UE edema, trace LE edema, L BKA Dialysis Access: R IJ [facility]   Filed Weights   10/04/20 0800 10/04/20 1140 10/06/20 1245  Weight: 84.9 kg 79.4 kg 83.6 kg    Intake/Output Summary (Last 24 hours) at 10/07/2020 1121 Last data filed at 10/06/2020 1556 Gross per 24 hour  Intake -  Output 5500 ml  Net -5500 ml    Additional Objective Labs: Basic Metabolic Panel: Recent Labs  Lab 10/04/20 0619 10/05/20 0159 10/06/20 0908 10/07/20 0929  NA 133* 136 130* 131*  K 6.7* 6.3* >7.5* 6.4*  CL 98 99 95* 96*  CO2 27 27 20* 21*  GLUCOSE 154* 177* 289* 148*  BUN 46* 32* 62* 48*  CREATININE 6.72* 5.43* 7.17* 5.76*  CALCIUM 8.8* 9.2 9.2 8.9  PHOS 4.1  --  4.8* 4.1   Liver Function Tests: Recent Labs  Lab 10/04/20 0619 10/06/20 0908 10/07/20 0929  ALBUMIN 2.0* 2.3* 2.3*   CBC: Recent Labs  Lab 10/02/20 0838 10/04/20 0619 10/05/20 0159  WBC 10.1 9.8 9.5  HGB 7.2* 7.0* 7.1*  HCT 23.8* 23.9* 23.9*  MCV 97.5 99.6 98.8  PLT 301 292 236   Blood Culture    Component Value Date/Time   SDES BLOOD RIGHT ANTECUBITAL 09/21/2020 0851   SDES BLOOD RIGHT ANTECUBITAL 09/21/2020 0851   SPECREQUEST  09/21/2020 0851    BOTTLES DRAWN AEROBIC  AND ANAEROBIC Blood Culture adequate volume   SPECREQUEST  09/21/2020 0851    BOTTLES DRAWN AEROBIC AND ANAEROBIC Blood Culture results may not be optimal due to an inadequate volume of blood received in culture bottles   CULT  09/21/2020 0851    NO GROWTH 5 DAYS Performed at [facility], [place] [address], [place], [place] [address]    CULT  09/21/2020 [phone]    NO GROWTH 5 DAYS Performed at [facility] [address] [place], [place] [address]    REPTSTATUS 09/26/2020 FINAL 09/21/2020 0851   REPTSTATUS 09/26/2020 FINAL 09/21/2020 0851   CBG: Recent Labs  Lab 10/06/20 0711 10/06/20 1157 10/06/20 1711 10/06/20 2211 10/07/20 0621  GLUCAP 233* 307* 128* 386* 229*    Medications:  . (feeding supplement) PROSource Plus  30 mL Oral TID BM  . calcitRIOL  2.5 mcg Oral Q M,W,F-HD  . Chlorhexidine Gluconate Cloth  6 each Topical Q0600  . Chlorhexidine Gluconate Cloth  6 each Topical Q0600  . cinacalcet  60 mg Oral Q breakfast  . darbepoetin (ARANESP) injection - DIALYSIS  150 mcg Intravenous Q Fri-HD  . fentaNYL  1 patch Transdermal Q72H  . heparin  5,000 Units Subcutaneous Q8H  . insulin aspart  0-6 Units Subcutaneous TID WC  . multivitamin  1 tablet Oral QHS  . sevelamer carbonate  4,000 mg Oral TID WC    Dialysis Orders: MWF @ [facility] 4h 35min 400/800 67kg TDC Hep 5000 + 5000 midrun - Calcitriol 47mcg PO q HD - No ESA ** Scheduled for R BC AVF on 09/24/20, canceled/delayed per patient request.  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  Issues with pain control - per primary team. 2. Hyperkalemia - Urgent HD yesterday due to K>7.5.  K today remains elevated at 6.4.  HD today using low K bath. Consuming outside food/drinks.  Counseled. Follow labs. 3. ESRD:Usual MWF schedule- HD yesterday due to hyperkalemia and again today per regular schedule.  4.HTN/volume:Volume  overloaded, large gains.  Counseled on fluid restrictions.  5. Anemia:S/p1U PRBCs on 12/27andAranesp dose increased to 150 q Friday. Last Hgb 7.1, recheck. FOBT negative. Transfuse prn 6. Secondary hyperparathyroidism:CorrCa/Phos good- continue binders/VDRA/sensipar. 7. Nutrition:Alb low - continue protein supplements. 8. Bipolar disorder + underlying personality disorder:Severe aggressionearlier this admit, continues to show aggression but not as severe. Psych consulted on 12/24 - did not meet criteria for inpatient psych. 9.L wrist abscess: S/p I&D 12/19 -now s/p antibiotic course. 10. T2DM: Chronically uncontrolled. 11: Itching/papules:Chronic per pt. Pruritis can be due to chronically high phosphorus/uremia. If persistent, outpatient dermatology consult may be appropriate  Dispo: Plan is d/c to SNF/rehab - pending placement.  Jen Mow, PA-C [name] 10/07/2020,11:21 AM  LOS: 26 days

## 2020-10-07 NOTE — Progress Notes (Signed)
Renal Navigator received update from [facility] that patient no longer has bed offer from [facility]. Navigator appreciates update and has canceled request for outpatient HD seat at [facility]. Navigator asks to be notified as soon as a new plan has been developed. Currently, patient's outpatient seat is back at his home clinic, [facility] MWF first shift.  Alphonzo Cruise, [place] Renal Navigator [phone]

## 2020-10-07 NOTE — Progress Notes (Signed)
Patient ID: [name], male   DOB: [birth year], 40 y.o.   MRN: [id number]  PROGRESS NOTE    [name]  QHU:[id number] DOB: Mar 27, 1981 DOA: 09/10/2020 PCP: Kerin Perna, NP    Brief Narrative:  40 year old male with ESRD on HD, bipolar disorder, narcotic seeking behavior, DM 2, HTN who was admitted 09/10/2020 with acute osteomyelitis of left ankle and foot with sepsis.  Course has been prolonged and patient underwent left BKA and I&D of left wrist abscess.  Hospital stay has been prolonged due to difficult placement due to uncontrolled pain/narcotic seeking behavior and need for SNF bed near dialysis center.   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 Decreased Dilaudid to 0.5 mg every 6 hours down from every 4 hours. Patient is previously refused Tylenol and gabapentin He is still on high-dose oxycodone Patient would benefit from comprehensive pain management specialist including psychiatric care.  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 Patient has completed course of antibiotics. Continue wound care. Vascular surgery on board.  Left wrist abscess Status post I&D on 12/19 -MSSA on abscess culture, completed antibiotic course. Continue to wet-to-dry dressings as per hand surgery/orthopedics. Also  needed hydrotherapy. Improving.  ESRD on hemodialysis-M/W/F with hyperkalemia today.  Nephrology order plan was to obtain AV graft on 1228 but patient had refused. For hemodialysis today. Continue hemodialysis while in hospital. Continue HD on discharge. Will need to transition HD sites for SNF, appreciate SW input.   Uncontrolled DM2, per chart and patient may be type 1 diabetic. Would clarify. May need long acting. Off Lantus at this time due to episodes of hypoglycemia. Continue sliding scale insulin, Accu-Cheks diabetic diet. Labile blood glucose control.Latest blood glucose of 175  Anemia of chronic kidney disease Has received 1 unit of packed RBC with hemodialysis during this admission. Monitor and transfuse as necessary. Latest hemoglobin of 7.1  Possible bipolar disorder Patient exhibited intermittent increasing psychosis and threatening behavior during hospitalization and required IVC. Psychiatry was consulted - was not a candidate for inpt tx  Moderate malnutrition in context of chronic illness Dietary on board. Encourage nutrition.  Disposition difficulty Patient with lack of assistance at home. Need for skilled nursing facility. Medically stable at this time. Awaiting for placement.   DVT prophylaxis: Heparin SQ Code Status: Full code  Family Communication: patient at bedside Disposition Plan: SNF Awaiting placment--pt is quite frustrated over the length of this process.   Consultants:   Nephrology  Vascular Surgery  Hand Surgery  Procedures:  I and D wrist abscess  Left BKA 09/16/2020  HD  Antimicrobials: Anti-infectives (From admission, onward)   Start     Dose/Rate Route Frequency Ordered Stop   09/25/20 1800  ceFAZolin (ANCEF) IVPB 1 g/50 mL premix  Status:  Discontinued        1 g 100 mL/hr over 30 Minutes Intravenous  Every 24 hours 09/25/20 1100 09/29/20 1056   09/23/20 1200  ceFAZolin (ANCEF) IVPB 2g/100 mL premix  Status:   Discontinued       "Followed by" Linked Group Details   2 g 200 mL/hr over 30 Minutes Intravenous Every M-W-F (Hemodialysis) 09/22/20 1029 09/25/20 1100   09/22/20 1115  ceFAZolin (ANCEF) IVPB 2g/100 mL premix       "Followed by" Linked Group Details   2 g 200 mL/hr over 30 Minutes Intravenous  Once 09/22/20 1029 09/22/20 2318   09/19/20 1254  Vancomycin (VANCOCIN) 750-5 MG/150ML-% IVPB       Note to Pharmacy: Judieth Keens  : cabinet override      09/19/20 1254 09/19/20 1317   09/19/20 1200  vancomycin (VANCOCIN) IVPB 750 mg/150 ml premix        750 mg 150 mL/hr over 60 Minutes Intravenous Every T-Th-Sa (Hemodialysis) 09/19/20 1034 09/19/20 1414   09/18/20 0800  Ampicillin-Sulbactam (UNASYN) 3 g in sodium chloride 0.9 % 100 mL IVPB  Status:  Discontinued        3 g 200 mL/hr over 30 Minutes Intravenous Every 12 hours 09/17/20 1226 09/22/20 1029   09/13/20 0933  Vancomycin (VANCOCIN) 750-5 MG/150ML-% IVPB       Note to Pharmacy: Herriott, Melisa   : cabinet override      09/13/20 0933 09/13/20 2144   09/11/20 1200  vancomycin (VANCOCIN) IVPB 750 mg/150 ml premix  Status:  Discontinued        750 mg 150 mL/hr over 60 Minutes Intravenous Every M-W-F (Hemodialysis) 09/11/20 0238 09/20/20 1134   09/11/20 1130  ceFEPIme (MAXIPIME) 1 g in sodium chloride 0.9 % 100 mL IVPB        1 g 200 mL/hr over 30 Minutes Intravenous Every 24 hours 09/11/20 0919 09/17/20 0921   09/11/20 0300  piperacillin-tazobactam (ZOSYN) IVPB 2.25 g  Status:  Discontinued        2.25 g 100 mL/hr over 30 Minutes Intravenous Every 8 hours 09/11/20 0238 09/11/20 0858   09/11/20 0300  vancomycin (VANCOREADY) IVPB 1250 mg/250 mL        1,250 mg 166.7 mL/hr over 90 Minutes Intravenous  Once 09/11/20 0238 09/11/20 5427       Subjective: Feels well, just frustrated with process.  Objective: Vitals:   10/07/20 1300 10/07/20 1309 10/07/20 1339 10/07/20 1450  BP:  (!) 139/107 118/64 119/63  Pulse:    99  Resp:  15   14  Temp:      TempSrc:      SpO2:    100%  Weight:      Height:       No intake or output data in the 24 hours ending 10/07/20 1756 Filed Weights   10/04/20 1140 10/06/20 1245 10/07/20 1100  Weight: 79.4 kg 83.6 kg 80.3 kg    Examination:  General exam: Appears calm and comfortable  Respiratory system: Clear to auscultation. Respiratory effort normal. Cardiovascular system: S1 & S2 heard, RRR.  Gastrointestinal system: Abdomen is nondistended, soft and nontender.  Central nervous system: Alert and oriented. No focal neurological deficits. Extremities: Symmetric  Skin: No rashes Psychiatry: Judgement and insight appear normal. Mood & affect appropriate.     Data Reviewed: I have personally reviewed following labs and imaging studies  CBC: Recent Labs  Lab 10/02/20 0838 10/04/20 0619 10/05/20 0159 10/07/20 1303  WBC 10.1 9.8 9.5 8.4  HGB 7.2* 7.0* 7.1* 7.2*  HCT 23.8* 23.9* 23.9* 24.3*  MCV  97.5 99.6 98.8 99.2  PLT 301 292 236 707   Basic Metabolic Panel: Recent Labs  Lab 10/02/20 0838 10/04/20 0619 10/05/20 0159 10/06/20 0908 10/07/20 0929  NA 133* 133* 136 130* 131*  K 6.2* 6.7* 6.3* >7.5* 6.4*  CL 96* 98 99 95* 96*  CO2 28 27 27  20* 21*  GLUCOSE 152* 154* 177* 289* 148*  BUN 39* 46* 32* 62* 48*  CREATININE 6.63* 6.72* 5.43* 7.17* 5.76*  CALCIUM 8.6* 8.8* 9.2 9.2 8.9  MG  --   --  2.4  --   --   PHOS 3.9 4.1  --  4.8* 4.1   GFR: Estimated Creatinine Clearance: 17.1 mL/min (A) (by C-G formula based on SCr of 5.76 mg/dL (H)). Liver Function Tests: Recent Labs  Lab 10/02/20 0838 10/04/20 0619 10/06/20 0908 10/07/20 0929  ALBUMIN 1.9* 2.0* 2.3* 2.3*   CBG: Recent Labs  Lab 10/06/20 1711 10/06/20 2211 10/07/20 0621 10/07/20 1446 10/07/20 1753  GLUCAP 128* 386* 229* 154* 218*    Scheduled Meds: . (feeding supplement) PROSource Plus  30 mL Oral TID BM  . calcitRIOL  2.5 mcg Oral Q M,W,F-HD  . Chlorhexidine Gluconate Cloth  6 each  Topical Q0600  . Chlorhexidine Gluconate Cloth  6 each Topical Q0600  . cinacalcet  60 mg Oral Q breakfast  . darbepoetin (ARANESP) injection - DIALYSIS  150 mcg Intravenous Q Fri-HD  . fentaNYL  1 patch Transdermal Q72H  . heparin  5,000 Units Subcutaneous Q8H  . insulin aspart  0-6 Units Subcutaneous TID WC  . multivitamin  1 tablet Oral QHS  . sevelamer carbonate  4,000 mg Oral TID WC   Continuous Infusions:   LOS: 26 days    Donnamae Jude, MD 10/07/2020 5:56 PM [phone] [facility] If 7PM-7AM, please contact night-coverage 10/07/2020, 5:56 PM

## 2020-10-07 NOTE — Progress Notes (Signed)
Critical Lab Value: Potassium- 6.4. MD was notified.

## 2020-10-07 NOTE — TOC Progression Note (Signed)
Transition of Care [facility]) - Progression Note    Patient Details  Name: [name] MRN: [id number] Date of Birth: 1981-07-31  [facility] CM/SW Contact  Bartholomew Crews, RN Phone Number: [phone] 10/07/2020, 4:50 PM  Clinical Narrative:     Spoke with patient at bedside per patient request. Patient upset about still being admitted to the hospital. He is wanting to get rehab so that he can return home. States that he shares a second floor apartment with his mom. His outpatient dialysis center is 4 minutes away. He states that he usually drives himself and plans to continue to drive himself.   Patient reports that his physical therapy is going well. Discussed what he would need to be able to go home. He stated that he could probably go upstairs if he had crutches and he could benefit from a wheelchair. He asked about when he would be ready to get his new leg, but NCM deferred this question to ortho at this time. Patient stated that he would need someone to change his dressing daily. Advised that home health does not visit daily, and usually a caregiver who is in the home or can visit daily is trained with how to do the dressing changes. Patient concerned that he has no one who can be trained to change such a severe wound.   Patient requested that this NCM call his mom, [name], at [phone] to inquire what questions she had. Patient advised that if she doesn't answer to send a text message and not leave a message stating she doesn't listen to her messages. Call made, no answer, brief introductory message left. Call to patient on his mobile phone to advise of follow through. Patient expressed appreciation for follow through.   TOC following for transition needs.   Expected Discharge Plan: [place] Barriers to Discharge: Continued Medical Work up,SNF Pending bed offer  Expected Discharge Plan and Services Expected Discharge Plan: [place]    Discharge Planning Services: CM Consult     Expected Discharge Date: 09/28/20                                     Social Determinants of Health (SDOH) Interventions    Readmission Risk Interventions No flowsheet data found.

## 2020-10-07 NOTE — TOC Progression Note (Addendum)
Transition of Care [facility]) - Progression Note    Patient Details  Name: [name] MRN: [id number] Date of Birth: January 07, 1981  Transition of Care [facility]) CM/SW Contact  Sharin Mons, RN Phone Number: 10/07/2020, 8:31 AM  Clinical Narrative:    NCM received call from [facility], [name]. [name] informed NCM they have to rescind bed offer 2/2 new COVID case, now not accepting unvaccinated COVID pts,  NCM made pt( bedside) and renal navigator aware (voicemessage). Pt not happy, upset. NCM  spoke with pt's mother and cousin Sherrie regarding matter @ pt's request.  TOC team will continue to monitor and assist with TOC needs....   Expected Discharge Plan: [place] Barriers to Discharge: Continued Medical Work up,SNF Pending bed offer  Expected Discharge Plan and Services Expected Discharge Plan: [place]   Discharge Planning Services: CM Consult     Expected Discharge Date: 09/28/20                                     Social Determinants of Health (SDOH) Interventions    Readmission Risk Interventions No flowsheet data found.

## 2020-10-08 DIAGNOSIS — N186 End stage renal disease: Secondary | ICD-10-CM | POA: Diagnosis not present

## 2020-10-08 DIAGNOSIS — Z992 Dependence on renal dialysis: Secondary | ICD-10-CM | POA: Diagnosis not present

## 2020-10-08 LAB — GLUCOSE, CAPILLARY
Glucose-Capillary: 34 mg/dL — CL (ref 70–99)
Glucose-Capillary: 70 mg/dL (ref 70–99)
Glucose-Capillary: 294 mg/dL — ABNORMAL HIGH (ref 70–99)
Glucose-Capillary: 256 mg/dL — ABNORMAL HIGH (ref 70–99)
Glucose-Capillary: 442 mg/dL — ABNORMAL HIGH (ref 70–99)
Glucose-Capillary: 372 mg/dL — ABNORMAL HIGH (ref 70–99)

## 2020-10-08 MED ORDER — OLANZAPINE 2.5 MG PO TABS
2.5000 mg | ORAL_TABLET | Freq: Every day | ORAL | Status: DC
  Filled 2020-10-08: qty 1

## 2020-10-08 MED ORDER — DEXTROSE 50 % IV SOLN
INTRAVENOUS | Status: AC
  Administered 2020-10-08: 25 mL
  Filled 2020-10-08: qty 50

## 2020-10-08 NOTE — Progress Notes (Addendum)
[facility] Progress Note   Subjective:   Patient seen and examined at bedside.  No new complaints.  Aggravated by the fact that he is still here. Denies CP, SOB, n/v/d and  abdominal pain.  Objective Vitals:   10/07/20 1339 10/07/20 1450 10/07/20 2135 10/08/20 0852  BP: 118/64 119/63 109/67 118/81  Pulse:  99 87 87  Resp:  14 18 16   Temp:   97.9 F (36.6 C) (!) 97.4 F (36.3 C)  TempSrc:   Tympanic Oral  SpO2:  100% 100% 100%  Weight:      Height:       Physical Exam General:WDWN male in NAD Heart:RRR, no mrg Lungs:CTAB Abdomen:soft, NTND Extremities:no LE edema Dialysis Access: [facility] c/d/i   Filed Weights   10/04/20 1140 10/06/20 1245 10/07/20 1100  Weight: 79.4 kg 83.6 kg 80.3 kg   No intake or output data in the 24 hours ending 10/08/20 0943  Additional Objective Labs: Basic Metabolic Panel: Recent Labs  Lab 10/04/20 0619 10/05/20 0159 10/06/20 0908 10/07/20 0929  NA 133* 136 130* 131*  K 6.7* 6.3* >7.5* 6.4*  CL 98 99 95* 96*  CO2 27 27 20* 21*  GLUCOSE 154* 177* 289* 148*  BUN 46* 32* 62* 48*  CREATININE 6.72* 5.43* 7.17* 5.76*  CALCIUM 8.8* 9.2 9.2 8.9  PHOS 4.1  --  4.8* 4.1   Liver Function Tests: Recent Labs  Lab 10/04/20 0619 10/06/20 0908 10/07/20 0929  ALBUMIN 2.0* 2.3* 2.3*   CBC: Recent Labs  Lab 10/02/20 0838 10/04/20 0619 10/05/20 0159 10/07/20 1303  WBC 10.1 9.8 9.5 8.4  HGB 7.2* 7.0* 7.1* 7.2*  HCT 23.8* 23.9* 23.9* 24.3*  MCV 97.5 99.6 98.8 99.2  PLT 301 292 236 237   CBG: Recent Labs  Lab 10/07/20 1753 10/07/20 2008 10/08/20 0445 10/08/20 0552 10/08/20 0712  GLUCAP 218* 330* 34* 70 294*    Medications:  . (feeding supplement) PROSource Plus  30 mL Oral TID BM  . calcitRIOL  2.5 mcg Oral Q M,W,F-HD  . Chlorhexidine Gluconate Cloth  6 each Topical Q0600  . Chlorhexidine Gluconate Cloth  6 each Topical Q0600  . cinacalcet  60 mg Oral Q breakfast  . darbepoetin (ARANESP) injection - DIALYSIS   150 mcg Intravenous Q Fri-HD  . fentaNYL  1 patch Transdermal Q72H  . heparin  5,000 Units Subcutaneous Q8H  . insulin aspart  0-5 Units Subcutaneous QHS  . insulin aspart  0-6 Units Subcutaneous TID WC  . multivitamin  1 tablet Oral QHS  . sevelamer carbonate  4,000 mg Oral TID WC    Dialysis Orders: MWF @ [facility] 4h 52min 400/800 67kg TDC Hep 5000 + 5000 midrun - Calcitriol 54mcg PO q HD - No ESA ** Scheduled for R BC AVF on 09/24/20, canceled/delayed per patient request.  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  Issues with pain control/phantom leg pain - per primary team. 2. Hyperkalemia - Ongoing issue since he was changed to regular diet.  Change back to renal diet.  Follow labs. Lokelma prn. 3. ESRD:Usual MWF schedule-HD tomorrow per regular schedule. 4.HTN/volume:BP in goal. Volume overloaded, large gains.  Counseled on fluid restrictions.  5. Anemia:S/p1U PRBCs on 12/27andAranesp dose increased to 150 q Friday. Last Hgb 7.2. FOBT negative. Transfuse prn 6. Secondary hyperparathyroidism:CorrCa/Phos good- continue binders/VDRA/sensipar. 7. Nutrition:Alb low - continue protein supplements. 8. Bipolar disorder + underlying personality disorder:Severe aggressionearlier this admit, continues to show aggression  but not as severe. Psych consulted on 12/24 - did not meet criteria for inpatient psych. 9.L wrist abscess: S/p I&D 12/19 -now s/p antibiotic course. 10. T2DM: Chronically uncontrolled. 11: Itching/papules:Chronic per pt. Pruritis can be due to chronically high phosphorus/uremia. If persistent, outpatient dermatology consult may be appropriate Dispo: Plan is d/c to SNF/rehab - pending placement.  Jen Mow, PA-C [name] 10/08/2020,9:43 AM  LOS: 27 days

## 2020-10-08 NOTE — Progress Notes (Addendum)
Patient CBG now is 294, doing well now more alert and oriented, day shift nurse updated to follow up, and monitor patient  And I just updated attending physician for this morning through [facility] pager.

## 2020-10-08 NOTE — Progress Notes (Signed)
PROGRESS NOTE    [name]  WSF:[id number] DOB: Feb 19, 1981 DOA: 09/10/2020 PCP: Kerin Perna, NP   Chief Complaint  Patient presents with  . Foot Pain    Brief Narrative: 40 year old male with complex comorbidities including ESRD on dialysis, bipolar disorder, narcotic seeking behavior, diabetes, hypertension admitted on 09/10/2020 with acute osteomyelitis of left ankle and foot along with sepsis.  Patient had complicated and prolonged hospitalization, he has had left BKA and I&D of left wrist abscess.  He has had difficulty placement due to uncontrolled pain/narcotic seeking behavior and need for skilled nursing facility neardialysis center.  Subjective: Overnight hypoglycemic in the 30s Blood sugar running high this morning Patient got very upset about his diet change Denies any new complaint.  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  Had Enterococcus and Proteus from the culture.  Completed cefazolin course, has a small area of wound dehiscence, continue wrapping, continue pain control-minimize narcotics but has been difficult, he is on high-dose oxycodone 20 mg every 3 hours, Dilaudid 0.5 mg every 6 hours and fentanyl patch 25 MCG.  Continue to increase on decreasing narcotics, previously refused Neurontin and Tylenol.  ESRD on HD MWF schedule for dialysis tomorrow Hyperkalemia underwent dialysis yesterday Secondary hyperparathyroidism continue binders/Sensipar  Left wrist abscess status post I&D on 12/19 with MSSA completed antibiotics.  Diabetes mellitus uncontrolled and brittle with episode of hypoglycemia.  Off Lantus due to episodes of hypoglycemia continue sliding scale insulin we will modify- d/c bedtime insulin, check am cortisol. Recent Labs  Lab 10/07/20 2008 10/08/20 0445 10/08/20 0552 10/08/20 0712 10/08/20 1139  GLUCAP 330* 34* 70 294*  256*   Lab Results  Component Value Date   HGBA1C 7.3 (H) 09/25/2020   Anemia of chronic disease received 1 unit PRBC with dialysis during this admission.  Monitor and transfuse if less than 7 g. Recent Labs  Lab 10/02/20 0838 10/04/20 0619 10/05/20 0159 10/07/20 1303  HGB 7.2* 7.0* 7.1* 7.2*  HCT 23.8* 23.9* 23.9* 24.3*   Possible bipolar disorder: Has been exhibiting intermittent increase seen agitation, threatening behavior during hospitalization and required IVC, psychiatry was consulted, was not a candidate for inpatient psychiatric transfer.   HTN: BP is controlled, not on meds.   Nutrition: Diet Order            Diet regular Room service appropriate? Yes; Fluid consistency: Thin  Diet effective now                 Nutrition Problem: Moderate Malnutrition Etiology: chronic illness (ESRD on HD) Signs/Symptoms: mild fat depletion,moderate fat depletion,moderate muscle depletion,severe muscle depletion Interventions: Prostat,MVI  Body mass index is 27.73 kg/m.  DVT prophylaxis: SCD's Start: 09/15/20 0359 heparin injection 5,000 Units Start: 09/11/20 0600 Code Status:   Code Status: Full Code Family Communication: plan of care discussed with patient at bedside.  Status is: Inpatient  Remains inpatient appropriate because:Unsafe d/c plan and Inpatient level of care appropriate due to severity of illness   Dispo:  Patient From: Home  Planned Disposition: [place]  Expected discharge date: 1-2 days  Medically stable for discharge: noe  Consultants:see note  Procedures:see note  Culture/Microbiology    Component Value Date/Time   SDES BLOOD RIGHT ANTECUBITAL 09/21/2020 0851   SDES BLOOD RIGHT ANTECUBITAL 09/21/2020 0851   SPECREQUEST  09/21/2020 0851    BOTTLES DRAWN AEROBIC AND ANAEROBIC Blood Culture adequate volume  SPECREQUEST  09/21/2020 0851    BOTTLES DRAWN AEROBIC AND ANAEROBIC Blood Culture results may not be optimal due to an  inadequate volume of blood received in culture bottles   CULT  09/21/2020 0851    NO GROWTH 5 DAYS Performed at [facility], [place] [address], [place], [place] [address]    CULT  09/21/2020 [phone]    NO GROWTH 5 DAYS Performed at [facility] [address], [place], [place] [address]    REPTSTATUS 09/26/2020 FINAL 09/21/2020 0851   REPTSTATUS 09/26/2020 FINAL 09/21/2020 0851    Other culture-see note  Medications: Scheduled Meds: . (feeding supplement) PROSource Plus  30 mL Oral TID BM  . calcitRIOL  2.5 mcg Oral Q M,W,F-HD  . Chlorhexidine Gluconate Cloth  6 each Topical Q0600  . Chlorhexidine Gluconate Cloth  6 each Topical Q0600  . cinacalcet  60 mg Oral Q breakfast  . darbepoetin (ARANESP) injection - DIALYSIS  150 mcg Intravenous Q Fri-HD  . fentaNYL  1 patch Transdermal Q72H  . heparin  5,000 Units Subcutaneous Q8H  . insulin aspart  0-5 Units Subcutaneous QHS  . insulin aspart  0-6 Units Subcutaneous TID WC  . multivitamin  1 tablet Oral QHS  . sevelamer carbonate  4,000 mg Oral TID WC   Continuous Infusions:  Antimicrobials: Anti-infectives (From admission, onward)   Start     Dose/Rate Route Frequency Ordered Stop   09/25/20 1800  ceFAZolin (ANCEF) IVPB 1 g/50 mL premix  Status:  Discontinued        1 g 100 mL/hr over 30 Minutes Intravenous Every 24 hours 09/25/20 1100 09/29/20 1056   09/23/20 1200  ceFAZolin (ANCEF) IVPB 2g/100 mL premix  Status:  Discontinued       "Followed by" Linked Group Details   2 g 200 mL/hr over 30 Minutes Intravenous Every M-W-F (Hemodialysis) 09/22/20 1029 09/25/20 1100   09/22/20 1115  ceFAZolin (ANCEF) IVPB 2g/100 mL premix       "Followed by" Linked Group Details   2 g 200 mL/hr over 30 Minutes Intravenous  Once 09/22/20 1029 09/22/20 2318   09/19/20 1254  Vancomycin (VANCOCIN) 750-5 MG/150ML-% IVPB       Note to Pharmacy: Judieth Keens  : cabinet override      09/19/20 1254 09/19/20 1317   09/19/20 1200   vancomycin (VANCOCIN) IVPB 750 mg/150 ml premix        750 mg 150 mL/hr over 60 Minutes Intravenous Every T-Th-Sa (Hemodialysis) 09/19/20 1034 09/19/20 1414   09/18/20 0800  Ampicillin-Sulbactam (UNASYN) 3 g in sodium chloride 0.9 % 100 mL IVPB  Status:  Discontinued        3 g 200 mL/hr over 30 Minutes Intravenous Every 12 hours 09/17/20 1226 09/22/20 1029   09/13/20 0933  Vancomycin (VANCOCIN) 750-5 MG/150ML-% IVPB       Note to Pharmacy: Herriott, Melisa   : cabinet override      09/13/20 0933 09/13/20 2144   09/11/20 1200  vancomycin (VANCOCIN) IVPB 750 mg/150 ml premix  Status:  Discontinued        750 mg 150 mL/hr over 60 Minutes Intravenous Every M-W-F (Hemodialysis) 09/11/20 0238 09/20/20 1134   09/11/20 1130  ceFEPIme (MAXIPIME) 1 g in sodium chloride 0.9 % 100 mL IVPB        1 g 200 mL/hr over 30 Minutes Intravenous Every 24 hours 09/11/20 0919 09/17/20 0921   09/11/20 0300  piperacillin-tazobactam (ZOSYN) IVPB 2.25 g  Status:  Discontinued        2.25 g 100 mL/hr over 30 Minutes Intravenous Every 8 hours 09/11/20 0238 09/11/20 0858   09/11/20 0300  vancomycin (VANCOREADY) IVPB 1250 mg/250 mL        1,250 mg 166.7 mL/hr over 90 Minutes Intravenous  Once 09/11/20 0238 09/11/20 0613     Objective: Vitals: Today's Vitals   10/08/20 0124 10/08/20 0542 10/08/20 0852 10/08/20 1000  BP:   118/81   Pulse:   87   Resp:   16   Temp:   (!) 97.4 F (36.3 C)   TempSrc:   Oral   SpO2:   100%   Weight:      Height:      PainSc: 9  9   5     No intake or output data in the 24 hours ending 10/08/20 1151 Filed Weights   10/04/20 1140 10/06/20 1245 10/07/20 1100  Weight: 79.4 kg 83.6 kg 80.3 kg   Weight change: -3.3 kg  Intake/Output from previous day: No intake/output data recorded. Intake/Output this shift: No intake/output data recorded.  Examination: General exam: AAOx3 ,NAD, weak appearing. HEENT:Oral mucosa moist, Ear/Nose WNL grossly,dentition normal. Respiratory  system: bilaterally clear,no wheezing or crackles,no use of accessory muscle, non tender. Cardiovascular system: S1 & S2 +, regular, No JVD. Gastrointestinal system: Abdomen soft, NT,ND, BS+. Nervous System:Alert, awake, moving extremities and grossly nonfocal Extremities: lt bka stump with intact staples small area of dehiscence see picture no drainage or erythema  Skin: No rashes,no icterus. MSK: Normal muscle bulk,tone, power HD catheter on chest-no drainage dry and clean    Data Reviewed: I have personally reviewed following labs and imaging studies CBC: Recent Labs  Lab 10/02/20 0838 10/04/20 0619 10/05/20 0159 10/07/20 1303  WBC 10.1 9.8 9.5 8.4  HGB 7.2* 7.0* 7.1* 7.2*  HCT 23.8* 23.9* 23.9* 24.3*  MCV 97.5 99.6 98.8 99.2  PLT 301 292 236 588   Basic Metabolic Panel: Recent Labs  Lab 10/02/20 0838 10/04/20 0619 10/05/20 0159 10/06/20 0908 10/07/20 0929  NA 133* 133* 136 130* 131*  K 6.2* 6.7* 6.3* >7.5* 6.4*  CL 96* 98 99 95* 96*  CO2 28 27 27  20* 21*  GLUCOSE 152* 154* 177* 289* 148*  BUN 39* 46* 32* 62* 48*  CREATININE 6.63* 6.72* 5.43* 7.17* 5.76*  CALCIUM 8.6* 8.8* 9.2 9.2 8.9  MG  --   --  2.4  --   --   PHOS 3.9 4.1  --  4.8* 4.1   GFR: Estimated Creatinine Clearance: 17.1 mL/min (A) (by C-G formula based on SCr of 5.76 mg/dL (H)). Liver Function Tests: Recent Labs  Lab 10/02/20 3254 10/04/20 0619 10/06/20 0908 10/07/20 0929  ALBUMIN 1.9* 2.0* 2.3* 2.3*   No results for input(s): LIPASE, AMYLASE in the last 168 hours. No results for input(s): AMMONIA in the last 168 hours. Coagulation Profile: No results for input(s): INR, PROTIME in the last 168 hours. Cardiac Enzymes: No results for input(s): CKTOTAL, CKMB, CKMBINDEX, TROPONINI in the last 168 hours. BNP (last 3 results) No results for input(s): PROBNP in the last 8760 hours. HbA1C: No results for input(s): HGBA1C in the last 72 hours. CBG: Recent Labs  Lab 10/07/20 2008  10/08/20 0445 10/08/20 0552 10/08/20 0712 10/08/20 1139  GLUCAP 330* 34* 70 294* 256*   Lipid Profile: No results for input(s): CHOL, HDL, LDLCALC, TRIG, CHOLHDL, LDLDIRECT in the last 72 hours. Thyroid Function Tests: No results for input(s): TSH, T4TOTAL,  FREET4, T3FREE, THYROIDAB in the last 72 hours. Anemia Panel: No results for input(s): VITAMINB12, FOLATE, FERRITIN, TIBC, IRON, RETICCTPCT in the last 72 hours. Sepsis Labs: No results for input(s): PROCALCITON, LATICACIDVEN in the last 168 hours.  No results found for this or any previous visit (from the past 240 hour(s)).   Radiology Studies: No results found.   LOS: 45 days   Antonieta Pert, MD [facility]  10/08/2020, 11:51 AM

## 2020-10-08 NOTE — Progress Notes (Signed)
Pt's CBG at 18:06=442. MD notified, received order to give novolog 7units. Explained to Pt that MD is placing him on carb modified diet but Pt stated he will eat what he wants to eat and would not comply with the diet that MD has ordered. He stated that he understands that being on a regular diet will not control his blood sugar but will keep his current diet. MD updated. Pt on regular diet at this time. Endorsed accordingly to oncoming RN.

## 2020-10-08 NOTE — Progress Notes (Signed)
    Durable Medical Equipment  (From admission, onward)         Start     Ordered   10/08/20 1122  For home use only DME lightweight manual wheelchair with seat cushion  Once       Comments: Patient suffers from below knee amputation, left  and ESRD which impairs their ability to perform daily activities like adls in the home.  A rolling walker will not resolve  issue with performing activities of daily living. A wheelchair will allow patient to safely perform daily activities. Patient is not able to propel themselves in the home using a standard weight wheelchair due to weakness. Patient can self propel in the lightweight wheelchair. Length of need lifetime. Accessories: elevating leg rests (ELRs), wheel locks, extensions and anti-tippers.   10/08/20 1122   10/08/20 1121  For home use only DME Walker rolling  Once       Question Answer Comment  Walker: With [place]   Patient needs a walker to treat with the following condition BKA stump complication ([place])      59/74/16 1122

## 2020-10-08 NOTE — TOC Progression Note (Signed)
Transition of Care [facility]) - Progression Note    Patient Details  Name: CHASEN MENDELL MRN: [id number] Date of Birth: 11/14/1980  Transition of Care [facility]) CM/SW Contact  Bartholomew Crews, RN Phone Number:  [phone] 10/08/2020, 4:28 PM  Clinical Narrative:     Spoke with patient at the bedside to advise of conversation with his mother. Patient stated that he has Medicaid transportation with [employer] for outpatient dialysis and all he has to do is call them to schedule. He stated that he has a neighbor who can assist with getting wheelchair up and down steps when needed. Discussed ongoing PT while in hospital to work on stairs - patient verbalized understanding. TOC following for transition needs.   Expected Discharge Plan: [place] Services Barriers to Discharge: Other (comment) (coordination of [place], DME, and caregiver traning for dressing)  Expected Discharge Plan and Services Expected Discharge Plan: [place] In-house Referral: Clinical Social Work Discharge Planning Services: CM Consult Post Acute Care Choice: [place] arrangements for the past 2 months: Apartment Expected Discharge Date: 09/28/20               DME Arranged: [employer] manual DME Agency: [employer] Date DME Agency Contacted: 10/08/20 Time DME Agency Contacted: 1504 Representative spoke with at DME Agency: Jodell Cipro [place]: RN,PT,OT [place] Agency: [facility] [employer] [facility] [employer] Date [place]: 10/08/20 Time [place]: 1146 Representative spoke with at [place]: [place]   Social Determinants of Health ([place]) Interventions    Readmission Risk Interventions No flowsheet data found.

## 2020-10-08 NOTE — Plan of Care (Signed)

## 2020-10-08 NOTE — TOC Progression Note (Signed)
Transition of Care [facility]) - Progression Note    Patient Details  Name: [name] MRN: [id number] Date of Birth: [birth year]  Transition of Care [facility]) CM/SW Contact  Bartholomew Crews, RN Phone Number: [phone] 10/08/2020, 11:47 AM  Clinical Narrative:     Spoke with patient at the bedside. He is in agreement with working on plan to go home with home health services. Patient has a caregiver for about 2.5 hours every evening 7 days a week through [place] with patient's friend,    Spoke with [place] at [place] [facility] [employer] - referral accepted for RN, PT, OT. Patient needs teachable caregiver for wound care since [facility] RN does not come every day.Shellia Carwin, who has graciously agreed to be taught how to do dressing change. Angie is planning on coming to hospital for training on the dressing change by nursing on Thursday late afternoon. Nursing - please sent 5 days of dressing supplies home with patient at discharge.  Discussed need for DME. Nursing to arrange for patient to get crutches. Referral sent to [employer] for wheelchair and walker.   Patient would like to use [facility] pharmacy for discharge medications. Usual preferred pharmacy is [facility].   PCP is Juluis Mire, NP at [place].   Outpatient hemodialysis clinic is [place], [facility] on MWF. Patient usually drives himself.   Patient states that he will need [place] transportation for discharge home.   TOC following for transition needs.   Expected Discharge Plan: [place] Services Barriers to Discharge: Other (comment) (coordination of [place], DME, and caregiver traning for dressing)  Expected Discharge Plan and Services Expected Discharge Plan: [place] In-house Referral: Clinical Social Work Discharge Planning Services: CM Consult Post Acute Care Choice: [place] arrangements for the past 2 months: Apartment Expected Discharge  Date: 09/28/20               DME Arranged: [employer] manual DME Agency: [employer] Date DME Agency Contacted: 10/08/20 Time DME Agency Contacted: [phone] Representative spoke with at DME Agency: Jodell Cipro [place]: RN,PT,OT [place] Agency: [facility] [employer] [facility] [employer] Date [place]: 10/08/20 Time [place]: 1146 Representative spoke with at [place]: [place]   Social Determinants of Health ([place]) Interventions    Readmission Risk Interventions No flowsheet data found.

## 2020-10-08 NOTE — TOC Progression Note (Signed)
Transition of Care [facility]) - Progression Note    Patient Details  Name: [name] MRN: [id number] Date of Birth: Oct 10, 1980  Transition of Care [facility]) CM/SW Contact  Bartholomew Crews, RN Phone Number: [phone] 10/08/2020, 2:43 PM  Clinical Narrative:     Spoke with patient at the bedside. Noted that walker and wheelchair had been delivered to the room. Discussed working with PT on stairs while here in the hospital.   Received call back from patient's mom, Rocky Link. She expressed concerns about what patient needs and living in a 2nd floor apartment. She is working with her sister with moving into a house that she can rent that only has a couple of steps, however, the house will not be available until March. Discussed concerns for daily dressing changes, and advised that Angie was agreeable to learning how to do the dressing change in order to do the dressing change on days that [facility] did not visit. Discussed limitations on [facility] and the importance of having a care partner. Ms. Quast asked about transportation to dialysis - discussed that patient was wanting to drive himself vs Medicaid transportation.   TOC following for transition needs. .   Expected Discharge Plan: [place] Services Barriers to Discharge: Other (comment) (coordination of [place], DME, and caregiver traning for dressing)  Expected Discharge Plan and Services Expected Discharge Plan: [place] In-house Referral: Clinical Social Work Discharge Planning Services: CM Consult Post Acute Care Choice: [place] arrangements for the past 2 months: Apartment Expected Discharge Date: 09/28/20               DME Arranged: [employer] manual DME Agency: [employer] Date DME Agency Contacted: 10/08/20 Time DME Agency Contacted: [phone] Representative spoke with at DME Agency: Jodell Cipro [place]: RN,PT,OT [place] Agency: [facility] [employer] [facility] [employer] Date [place]: 10/08/20 Time [place]: 1146 Representative spoke with at [place]: [place]   Social Determinants of Health ([place]) Interventions    Readmission Risk Interventions No flowsheet data found.

## 2020-10-08 NOTE — Progress Notes (Signed)
Physical Therapy Treatment Patient Details Name: [name] MRN: [id number] DOB: Jun 27, 1981 Today's Date: 10/08/2020    History of Present Illness Patient is a 40 y/o male with PMH significant for DM Type 1, gastroparesis, HTN, ESRD, chronic L foot diabetic wound infection. Patient admitted for sepsis 2/2 acute osteomyelitis involving L ankle and foot. 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    PT Comments    Pt seated EOB on arrival, c/o fatigue but agreeable to therapy session with encouragement. Pt with fair participation and tolerance for mobility, limited due to fatigue and with decreased safety awareness/impulsivity. Pt agreeable to attempt stair trial with 4" platform step in room and RW for support, however not allowing therapist to use gait belt. Pt unable to hop high enough to perform single step and returned to EOB. Pt performed gait trial ~103ft with Supervision/RW but defers further activity. Anticipate pt will need gait belt and +2 physical assist to successfully perform stair trial, but may not allow this due to anxiety, may even need to attempt posterior seated scooting/boosting up steps if ambulance transport unavailable. Pt continues to benefit from PT services to progress toward functional mobility goals. D/C recs below remain appropriate, if pt instead DC home will need PTAR for transport due to inability to perform stairs at this point.   Follow Up Recommendations  SNF;Supervision/Assistance - 24 hour     Equipment Recommendations  Rolling walker with 5" wheels;3in1 (PT);Wheelchair (measurements PT);Wheelchair cushion (measurements PT) (pt would benefit from ramp installed for ease of entry vs PTAR if DC home)    Recommendations for Other Services       Precautions / Restrictions Precautions Precautions: Fall Precaution Comments: 3+ falls during this admission Restrictions Weight Bearing Restrictions: Yes LLE Weight Bearing: Non weight bearing     Mobility  Bed Mobility Overal bed mobility: Modified Independent             General bed mobility comments: pt seated EOB on PTA arrival to room  Transfers Overall transfer level: Needs assistance Equipment used: Rolling walker (2 wheeled) Transfers: Sit to/from Stand Sit to Stand: Supervision         General transfer comment: supervision for safety, refuses physical assistance; therapist hand on RW to steady it only  Ambulation/Gait Ambulation/Gait assistance: Supervision Gait Distance (Feet): 10 Feet (to/from platform step) Assistive device: Rolling walker (2 wheeled) Gait Pattern/deviations: Decreased stride length (hop-to pattern)     General Gait Details: supervision for safety, refuses to be touched. mild LOB when turning to sit EOB but pt able to reach out for bed rail for semi-controlled stand>sit   Stairs Stairs: Yes Stairs assistance: Supervision Stair Management: Forwards;With walker Number of Stairs: 0 General stair comments: pt attempted forward stair ascent using RW onto 4" platform step, but not allowing staff to use gait belt or physical assist him, pt attempted to hop up onto step but unable to hop high enough unassisted to reach step; pt then defers to reattempt   Wheelchair Mobility    Modified Rankin (Stroke Patients Only)       Balance Overall balance assessment: Needs assistance Sitting-balance support: No upper extremity supported Sitting balance-[name]: Good     Standing balance support: Bilateral upper extremity supported;During functional activity Standing balance-[name]: Poor Standing balance comment: reliant on UE support; mild LOB when performing turn prior to sitting EOB, able to use bed rail and RW handle for semi-controlled lowering  Cognition Arousal/Alertness: Awake/alert Behavior During Therapy: WFL for tasks assessed/performed Overall Cognitive Status: Impaired/Different  from baseline Area of Impairment: Memory;Safety/judgement                     Memory: Decreased short-term memory   Safety/Judgement: Decreased awareness of safety;Decreased awareness of deficits   Problem Solving: Requires verbal cues;Difficulty sequencing General Comments: pt not receptive to need for gait belt use when performing more difficult tasks such as stair training. Pt with LOB when performing stand>sit causing therapist to also overbalance as he is requesting therapist to stand far away from him. when attemping to point this out, pt not receptive.      Exercises Other Exercises Other Exercises: pt deferred to attempt after walking    General Comments General comments (skin integrity, edema, etc.): vitals taken after return to EOB from short gait task: BP 112/87 seated; HR 99 bpm, SpO2 100% on RA      Pertinent Vitals/Pain Pain Assessment: Faces Faces Pain Scale: No hurt Pain Intervention(s): Monitored during session    Home Living                      Prior Function            PT Goals (current goals can now be found in the care plan section) Acute Rehab PT Goals Patient Stated Goal: to go home PT Goal Formulation: With patient Time For Goal Achievement: 10/11/20 Potential to Achieve Goals: Fair Progress towards PT goals: Progressing toward goals    Frequency    Min 3X/week      PT Plan Current plan remains appropriate    Co-evaluation              AM-PAC PT "6 Clicks" Mobility   Outcome Measure  Help needed turning from your back to your side while in a flat bed without using bedrails?: None Help needed moving from lying on your back to sitting on the side of a flat bed without using bedrails?: None Help needed moving to and from a bed to a chair (including a wheelchair)?: A Little Help needed standing up from a chair using your arms (e.g., wheelchair or bedside chair)?: A Little Help needed to walk in hospital room?: A  Little Help needed climbing 3-5 steps with a railing? : A Lot 6 Click Score: 19    End of Session Equipment Utilized During Treatment:  (pt refusing gait belt) Activity Tolerance: Patient tolerated treatment well Patient left: in bed;with call bell/phone within reach (pt seated EOB, NT entering room) Nurse Communication: Mobility status PT Visit Diagnosis: Unsteadiness on feet (R26.81);Other abnormalities of gait and mobility (R26.89);Muscle weakness (generalized) (M62.81);Difficulty in walking, not elsewhere classified (R26.2)     Time: 0932-3557 PT Time Calculation (min) (ACUTE ONLY): 12 min  Charges:  $Gait Training: 8-22 mins                     Schneur Crowson P., PTA Acute Rehabilitation Services Pager: [phone] Office: [place] 10/08/2020, 3:42 PM

## 2020-10-08 NOTE — Progress Notes (Signed)
Hypoglycemic Event  CBG:34  Treatment: 92ml of D50 Symptoms: confused for like 10 minutes  Follow-up CBG: Time:0550 CBG Result:70  Possible Reasons for Event: unknown Comments/MD notified:will do    Guinevere Scarlet

## 2020-10-08 NOTE — Progress Notes (Signed)
Orthopedic Tech Progress Note Patient Details:  [name] Aug 06, 1981 [id number]  Ortho Devices Type of Ortho Device: Crutches Ortho Device/Splint Interventions: Ordered,Application   Post Interventions Patient Tolerated: Well Instructions Provided: Adjustment of device,Care of device   [name] 10/08/2020, 2:46 PM

## 2020-10-09 DIAGNOSIS — N186 End stage renal disease: Secondary | ICD-10-CM | POA: Diagnosis not present

## 2020-10-09 DIAGNOSIS — Z992 Dependence on renal dialysis: Secondary | ICD-10-CM | POA: Diagnosis not present

## 2020-10-09 LAB — CORTISOL-AM, BLOOD: Cortisol - AM: 7.6 ug/dL (ref 6.7–22.6)

## 2020-10-09 LAB — CBC
HCT: 25.3 % — ABNORMAL LOW (ref 39.0–52.0)
Hemoglobin: 7.1 g/dL — ABNORMAL LOW (ref 13.0–17.0)
MCH: 28.2 pg (ref 26.0–34.0)
MCHC: 28.1 g/dL — ABNORMAL LOW (ref 30.0–36.0)
MCV: 100.4 fL — ABNORMAL HIGH (ref 80.0–100.0)
Platelets: 162 10*3/uL (ref 150–400)
RBC: 2.52 MIL/uL — ABNORMAL LOW (ref 4.22–5.81)
RDW: 16.7 % — ABNORMAL HIGH (ref 11.5–15.5)
WBC: 7.6 10*3/uL (ref 4.0–10.5)
nRBC: 0 % (ref 0.0–0.2)

## 2020-10-09 LAB — POTASSIUM: Potassium: 4.9 mmol/L (ref 3.5–5.1)

## 2020-10-09 LAB — BASIC METABOLIC PANEL
Anion gap: 9 (ref 5–15)
BUN: 47 mg/dL — ABNORMAL HIGH (ref 6–20)
CO2: 29 mmol/L (ref 22–32)
Calcium: 9 mg/dL (ref 8.9–10.3)
Chloride: 93 mmol/L — ABNORMAL LOW (ref 98–111)
Creatinine, Ser: 6.02 mg/dL — ABNORMAL HIGH (ref 0.61–1.24)
GFR, Estimated: 11 mL/min — ABNORMAL LOW (ref >60)
Glucose, Bld: 227 mg/dL — ABNORMAL HIGH (ref 70–99)
Potassium: 6.7 mmol/L (ref 3.5–5.1)
Sodium: 131 mmol/L — ABNORMAL LOW (ref 135–145)

## 2020-10-09 LAB — GLUCOSE, CAPILLARY
Glucose-Capillary: 172 mg/dL — ABNORMAL HIGH (ref 70–99)
Glucose-Capillary: 124 mg/dL — ABNORMAL HIGH (ref 70–99)
Glucose-Capillary: 197 mg/dL — ABNORMAL HIGH (ref 70–99)
Glucose-Capillary: 156 mg/dL — ABNORMAL HIGH (ref 70–99)

## 2020-10-09 MED ORDER — DEXTROSE 50 % IV SOLN
INTRAVENOUS | Status: AC
  Filled 2020-10-09: qty 50

## 2020-10-09 MED ORDER — HEPARIN SODIUM (PORCINE) 1000 UNIT/ML IJ SOLN
INTRAMUSCULAR | Status: AC
  Filled 2020-10-09: qty 5

## 2020-10-09 MED ORDER — CALCIUM GLUCONATE-NACL 1-0.675 GM/50ML-% IV SOLN
1.0000 g | Freq: Once | INTRAVENOUS | Status: AC
  Administered 2020-10-09: 1000 mg via INTRAVENOUS
  Filled 2020-10-09: qty 50

## 2020-10-09 MED ORDER — OXYCODONE HCL 5 MG PO TABS
ORAL_TABLET | ORAL | Status: AC
  Filled 2020-10-09: qty 4

## 2020-10-09 MED ORDER — CALCITRIOL 0.5 MCG PO CAPS
ORAL_CAPSULE | ORAL | Status: AC
  Filled 2020-10-09: qty 5

## 2020-10-09 MED ORDER — SODIUM ZIRCONIUM CYCLOSILICATE 10 G PO PACK
10.0000 g | PACK | Freq: Two times a day (BID) | ORAL | Status: DC
  Administered 2020-10-09 – 2020-10-11 (×4): 10 g via ORAL
  Filled 2020-10-09 (×4): qty 1

## 2020-10-09 MED ORDER — HEPARIN SODIUM (PORCINE) 1000 UNIT/ML IJ SOLN
INTRAMUSCULAR | Status: AC
  Filled 2020-10-09: qty 4

## 2020-10-09 MED ORDER — DEXTROSE 50 % IV SOLN
50.0000 mL | Freq: Once | INTRAVENOUS | Status: AC
  Administered 2020-10-09: 50 mL via INTRAVENOUS
  Filled 2020-10-09: qty 50

## 2020-10-09 MED ORDER — INSULIN ASPART 100 UNIT/ML IV SOLN
10.0000 [IU] | Freq: Once | INTRAVENOUS | Status: AC
  Administered 2020-10-09: 10 [IU] via INTRAVENOUS

## 2020-10-09 NOTE — Plan of Care (Signed)

## 2020-10-09 NOTE — Progress Notes (Signed)
Patient potassium level this morning was 6.7, got an orders from attending physician; 10 units of insulin, D50, and calcium gluconate IVPB, Orders administered, report given to HD nurse and update also given to day shift nurse, will continue to monitor patient.

## 2020-10-09 NOTE — Progress Notes (Signed)
Nutrition Follow-up  DOCUMENTATION CODES:   Non-severe (moderate) malnutrition in context of chronic illness  INTERVENTION:   Continue Prosource Plus 30 mL TID, each supplement provides 100 kcals and 15 grams of protein.   Continue renal MVI  Continue liberalized diet of regular  NUTRITION DIAGNOSIS:   Moderate Malnutrition related to chronic illness (ESRD on HD) as evidenced by mild fat depletion,moderate fat depletion,moderate muscle depletion,severe muscle depletion.  Ongoing  GOAL:   Patient will meet greater than or equal to 90% of their needs  Progressing  MONITOR:   PO intake,Supplement acceptance,Labs,Weight trends,Skin,I & O's  REASON FOR ASSESSMENT:   Consult Diet education  ASSESSMENT:   [name] is a 40 y.o. 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  States he has been seeing podiatry about this, Dr. March Rummage.  States wound seems to be worsening and is largere. Now foot has started to become red and warm to the touch with increased pain. Has developed a foul smelling drainage in past few days. He reports subjective fever and intermittent chills the past day. States he has been compliant with his dialysis treatment, due for treatment again tomorrow. He has missed some follow up with podiatry he states. Xray shows osteomyelitis in foot and pt is started on empiric antibiotics in the ER. He has elevated WBC on labs.  12/18- MRI of lt wrist region reveals early abscess vs phlegmon 12/19- s/pPROCEDURE:Incision and drainage left wrist lesion; cultures sent; plan to start hydrotherapy 3-4 days post-op 12/20- s/p PROCEDURE:Left below-knee amputation 12/25- hydrotherapy initiated to lt wrist abscess 12/31- transitioned to wet to dry dressings, hydrotherapy d/c  Hospital stay has been prolonged due to  difficult placement due to uncontrolled pain/narcotic seeking behavior and need for SNF bed near dialysis center.  Attempted to speak with pt at bedside. Pt reports he has had a busy day and wanted to take a nap. Pt deferred answering RD questions.   Per discussion with NT, pt is very agitated today d/t attempted diet change earlier. NT reports that the pt will eat whatever he wants and will become agitated if told otherwise. Per MD note, pt is aware of clinical condition and understands the consequences of his noncompliance with treatment plan.   Per chart review, pt is consuming 100% of documented meal records. Pt is currently on a regular diet. Per MAR review, pt typically compliant with Prosource Plus supplements. Per chart review, pt is resistant to trying other supplements (Boost, Nepro, etc.)   Weight at admission: 153 lbs, Current weight: 163 lbs. Per nephrology, EDW is 67 kg.   Labs reviewed: CBG's x 24 hours: 124-442, Sodium 131, Potassium 6.7, Chloride 93  Medications reviewed and include: Calcitriol, Sensipar, Aranesp, Fentanyl patch, Novolog SSI, Renvela, Lokelma    Diet Order:   Diet Order            Diet regular Room service appropriate? Yes; Fluid consistency: Thin  Diet effective now                 EDUCATION NEEDS:   Education needs have been addressed  Skin:  Skin Assessment: Skin Integrity Issues: Skin Integrity Issues:: Other (Comment),Incisions Incisions: s/p lt BKA Other: abscess to lt wrist  Last BM:  10/08/20  Height:   Ht Readings from Last 1 Encounters:  09/10/20 5\' 7"  (1.702 m)    Weight:   Wt  Readings from Last 1 Encounters:  10/09/20 74 kg    Ideal Body Weight:  62.9 kg (adjusted for lt BKA)  BMI:  Body mass index is 25.55 kg/m.  Estimated Nutritional Needs:   Kcal:  2050-2250  Protein:  105-120 grams  Fluid:  1000 ml + UOP    Ronnald Nian, Dietetic Intern Pager: [phone] If unavailable: [phone]

## 2020-10-09 NOTE — Progress Notes (Signed)
OT Cancellation Note  Patient Details Name: [name] MRN: [id number] DOB: 05/30/1981   Cancelled Treatment:    Reason Eval/Treat Not Completed: Patient declined, no reason specified. Pt decline sitting EOB ad OOB activity and requested OT return later or tomorrow as he just wants to relax right now. OT will follow up next available time  Britt Bottom 10/09/2020, 3:47 PM

## 2020-10-09 NOTE — Progress Notes (Signed)
Received Pt on bed from HD, alert and oriented x4, denies pain 0/10. Dressing to LLE and LUE changed. Pt refused for VS to be taken at this time, refused for bed alarm to be turned on. Pt states he would call staff for assistance when he needs it. Pt not in distress.

## 2020-10-09 NOTE — Progress Notes (Signed)
Cross Cover Informed by pharmacist patients latest K level now 4.9 and patient is on lokelma BID.   Hyperkalemia is recurrent issue secondary to non compliance with renal diet and drug recommended, ordered by nephrology.  Therefore, we will continue lokelma for now and follow K evels

## 2020-10-09 NOTE — Progress Notes (Addendum)
OT Cancellation Note  Patient Details Name: [name] MRN: [id number] DOB: 1981/01/20   Cancelled Treatment:    Reason Eval/Treat Not Completed: Patient at procedure or test/ unavailable. Pt at HD, OT will follow up next available time as appropriate  Britt Bottom 10/09/2020, 9:35 AM

## 2020-10-09 NOTE — Progress Notes (Addendum)
[facility] Progress Note   Subjective:   Patient seen and examined at bedside in dialysis.  Potassium elevated again today.  Refusing renal diet, becomes angry when discussed.  Currently on regular diet.  Aware of ramifications of elevated potassium and continues to refuse. Denies CP, palpitations, SOB, n/v/d, weakness and fatigue. Aggravated by asking these questions stating "I feel like you dont trust me or think I am intelligent enough to tell you if I feel bad, I would tell you if there was something wrong/different."  Reassured I am not questioning his intelligence but simply making sure he is not symptomatic.    Objective Vitals:   10/08/20 0852 10/08/20 1530 10/08/20 1949 10/09/20 0658  BP: 118/81 112/87 133/82 126/78  Pulse: 87 99 86 87  Resp: 16 14 16 18   Temp: (!) 97.4 F (36.3 C) (!) 97.5 F (36.4 C) 98 F (36.7 C) 97.8 F (36.6 C)  TempSrc: Oral Oral Oral Oral  SpO2: 100% 100% 98% 100%  Weight:      Height:       Physical Exam General:WDWN male in NAD, +facial edema Heart:RRR, no mrg Lungs:CTAB Abdomen:soft, NTND Extremities:L BKA, trace LE edema Dialysis Access: [place]   Filed Weights   10/04/20 1140 10/06/20 1245 10/07/20 1100  Weight: 79.4 kg 83.6 kg 80.3 kg   No intake or output data in the 24 hours ending 10/09/20 0952  Additional Objective Labs: Basic Metabolic Panel: Recent Labs  Lab 10/04/20 0619 10/05/20 0159 10/06/20 0908 10/07/20 0929 10/09/20 0304  NA 133*   < > 130* 131* 131*  K 6.7*   < > >7.5* 6.4* 6.7*  CL 98   < > 95* 96* 93*  CO2 27   < > 20* 21* 29  GLUCOSE 154*   < > 289* 148* 227*  BUN 46*   < > 62* 48* 47*  CREATININE 6.72*   < > 7.17* 5.76* 6.02*  CALCIUM 8.8*   < > 9.2 8.9 9.0  PHOS 4.1  --  4.8* 4.1  --    < > = values in this interval not displayed.   Liver Function Tests: Recent Labs  Lab 10/04/20 0619 10/06/20 0908 10/07/20 0929  ALBUMIN 2.0* 2.3* 2.3*   CBC: Recent Labs  Lab 10/04/20 0619  10/05/20 0159 10/07/20 1303 10/09/20 0304  WBC 9.8 9.5 8.4 7.6  HGB 7.0* 7.1* 7.2* 7.1*  HCT 23.9* 23.9* 24.3* 25.3*  MCV 99.6 98.8 99.2 100.4*  PLT 292 236 237 162   CBG: Recent Labs  Lab 10/08/20 0712 10/08/20 1139 10/08/20 1806 10/08/20 1952 10/09/20 0651  GLUCAP 294* 256* 442* 372* 172*    Medications:  . (feeding supplement) PROSource Plus  30 mL Oral TID BM  . calcitRIOL      . calcitRIOL  2.5 mcg Oral Q M,W,F-HD  . Chlorhexidine Gluconate Cloth  6 each Topical Q0600  . Chlorhexidine Gluconate Cloth  6 each Topical Q0600  . cinacalcet  60 mg Oral Q breakfast  . darbepoetin (ARANESP) injection - DIALYSIS  150 mcg Intravenous Q Fri-HD  . dextrose      . fentaNYL  1 patch Transdermal Q72H  . heparin  5,000 Units Subcutaneous Q8H  . insulin aspart  0-6 Units Subcutaneous TID WC  . multivitamin  1 tablet Oral QHS  . oxyCODONE      . sevelamer carbonate  4,000 mg Oral TID WC    Dialysis Orders: MWF @ [facility] 4h 6min 400/800 67kg TDC Hep  5000 + 5000 midrun - Calcitriol 91mcg PO q HD - No ESA ** Scheduled for R BC AVF on 09/24/20, canceled/delayed per patient request.  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 Issues with pain control/phantom leg pain- per primary team. 2. Hyperkalemia- Ongoing issue since he was changed to regular diet.  Refusing to change back to renal diet.  Aware of dangers. Follow labs. Lokelma BID.  K 6.7 this AM, using low K bath for part of treatment.  3. ESRD:Usual MWF schedule-HD today per regular schedule. 4.HTN/volume:BP in goal. Volume overloaded, large gains. Counseled on fluid restrictions.  5. Anemia:S/p1U PRBCs on 12/27andAranesp dose increased to 150 q Friday. Last Hgb 7.1.FOBT negative. Transfuse prn 6. Secondary hyperparathyroidism:CorrCa/Phos good- continue binders/VDRA/sensipar. 7. Nutrition:Alb low - continue protein  supplements. 8. Bipolar disorder + underlying personality disorder:Severe aggressionearlier this admit, continues to show aggression but not as severe. Psych consulted on 12/24 - did not meet criteria for inpatient psych. 9.L wrist abscess: S/p I&D 12/19 -now s/p antibiotic course. 10. T2DM: Chronically uncontrolled. 11: Itching/papules:Chronic per pt. Pruritis can be due to chronically high phosphorus/uremia. If persistent, outpatient dermatology consult may be appropriate 12. Dispo: Now planning to go home with home health.   Jen Mow, PA-C [name] 10/09/2020,9:52 AM  LOS: 28 days

## 2020-10-09 NOTE — Plan of Care (Signed)

## 2020-10-09 NOTE — Progress Notes (Signed)
PROGRESS NOTE    [name]  CVE:[id number] DOB: Apr 01, 1981 DOA: 09/10/2020 PCP: Kerin Perna, NP   Chief Complaint  Patient presents with  . Foot Pain    Brief Narrative: 40 year old male with complex comorbidities including ESRD on dialysis, bipolar disorder, narcotic seeking behavior, diabetes, hypertension admitted on 09/10/2020 with acute osteomyelitis of left ankle and foot along with sepsis.  Patient had complicated and prolonged hospitalization, he has had left BKA and I&D of left wrist abscess.  He has had difficulty placement due to uncontrolled pain/narcotic seeking behavior and need for skilled nursing facility neardialysis center. Patient had ongoing issues with uncontrolled hyperglycemia resulting into need for more insulin and hypoglycemia, has had issues with hyperkalemia and severe dietary noncompliance and would get very aggressive on changing his diet regimen or any pain regimen  Subjective: Seen in dialysis this morning.   Potassium very high this morning received calcium gluconate insulin  Alert awake oriented x3 able to interact well. Easily gets aggressive. Was very upset about his diet change. I stayed next to his bed to engage in conversation regarding his uncontrolled hyperglycemia dietary noncompliance and pain regimen and also about his recovery after BKA about wound healing and overall quality of life moving forward: He is aware about his clinical condition and also was possible complications of hyperglycemia and side effects of opiates, he stated to me " I have been in the jail 4 times so I understand the consequences of my action and I have been living with diabetes since age of 89" " I have been eating like this for many years and I will keep doing the same" " I understand high blood sugar will cause delay in wound healing and also cause high risk of infection and further complication"  Assessment & Plan:  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  Had Enterococcus and Proteus from the culture.  Patient had completed antibiotic course.  Is followed by podiatry has a small area of wound dehiscence, staples intact.  Continue wound care and ace wrapping, continue pain control- he is needing oxycodone 20 mg every 3 hours, Dilaudid 0.5 mg every 6 hours and fentanyl patch 25 MCG-again discussed with him for the need to wean down opiates, patient refused further discussion.  We will let podiatry know for his own follow-up and also for pain control, he will benefit with outpatient pain management follow up.he seems to be tolerating and is alert not on sedation.Previously refused Neurontin and Tylenol.  ESRD on HD MWF getting dialysis this morning.   Hyperkalemia status post temporizing measures this morning.  Patient refuses renal diet, continues to be noncompliant likely contributing to his hyperkalemia.  Nephrology on board continue plan per nephrology Recent Labs  Lab 10/04/20 [phone] 10/05/20 0159 10/06/20 0908 10/07/20 0929 10/09/20 0304  K 6.7* 6.3* >7.5* 6.4* 6.7*  Secondary hyperparathyroidism continue binders/Sensipar  Left wrist abscess s/p post I&D on 12/19 with MSSA completed antibiotics.  Diabetes mellitus w/ uncontrolled hyperglycemia and brittle with episode of hypoglycemia.:Off Lantus due to episodes of hypoglycemia.  Hemoglobin A1c 7.3 on 12/29. Blood sugar remains poorly controlled due to dietary indiscretion, multiple attempts to counsel on diet has been unsuccessful.  Patient understands complications of hyperglycemia including delay in wound healing/risk of infection and further complication of BKA-he is alert awake oriented x3.  I am afraid if he continues to be noncompliant he will have complications moving forward  in the future.  Continue on current sliding scale insulin Recent Labs  Lab 10/08/20 1139 10/08/20 1806 10/08/20 1952  10/09/20 0651 10/09/20 1119  GLUCAP 256* 442* 372* 172* 124*   Lab Results  Component Value Date   HGBA1C 7.3 (H) 09/25/2020   Anemia of chronic disease received 1 unit PRBC with dialysis during this admission.  H/h on lower side, monitor and transfuse if less than 7 g. Recent Labs  Lab 10/04/20 0619 10/05/20 0159 10/07/20 1303 10/09/20 0304  HGB 7.0* 7.1* 7.2* 7.1*  HCT 23.9* 23.9* 24.3* 25.3*   Possible bipolar disorder: Answers questions appropriately alert awake oriented x3.  He does easily get agitated regarding change in his diet or pain regimen.  HE has had threatening behavior during hospitalization and required IVC, psychiatry was consulted, was not a candidate for inpatient psychiatric transfer.   HTN: BP is controlled, not on meds.   Poor compliance with diet/poor compliance with treatment: I stayed next to his bed to engage in conversation regarding his uncontrolled hyperglycemia dietary noncompliance and pain regimen and also about his recovery after BKA about wound healing and overall quality of life moving forward: He is aware about his clinical condition and also was possible complications of hyperglycemia and side effects of opiates, he stated to me " I have been in the jail 4 times so I understand the consequences of my action and I have been living with diabetes since age of 37" " I have been eating like this for many years and I will keep doing the same" " I understand high blood sugar will cause delay in wound healing and also cause high risk of infection and further complication"  Nutrition: Diet Order            Diet regular Room service appropriate? Yes; Fluid consistency: Thin  Diet effective now                 Nutrition Problem: Moderate Malnutrition Etiology: chronic illness (ESRD on HD) Signs/Symptoms: mild fat depletion,moderate fat depletion,moderate muscle depletion,severe muscle depletion Interventions: Prostat,MVI  Body mass index is 27.73  kg/m.  DVT prophylaxis: SCD's Start: 09/15/20 0359 heparin injection 5,000 Units Start: 09/11/20 0600 Code Status:   Code Status: Full Code Family Communication: plan of care discussed with patient at bedside.  Status is: Inpatient  Remains inpatient appropriate because:Unsafe d/c plan and Inpatient level of care appropriate due to severity of illness  Dispo:  Patient From: Home  Planned Disposition: [facility] vs HHC.  "I will be going home home on Friday"  Expected discharge date: 2 days  Medically stable for discharge: noe  Consultants:see note  Procedures:see note  Culture/Microbiology    Component Value Date/Time   SDES BLOOD RIGHT ANTECUBITAL 09/21/2020 0851   SDES BLOOD RIGHT ANTECUBITAL 09/21/2020 0851   SPECREQUEST  09/21/2020 0851    BOTTLES DRAWN AEROBIC AND ANAEROBIC Blood Culture adequate volume   SPECREQUEST  09/21/2020 0851    BOTTLES DRAWN AEROBIC AND ANAEROBIC Blood Culture results may not be optimal due to an inadequate volume of blood received in culture bottles   CULT  09/21/2020 0851    NO GROWTH 5 DAYS Performed at [facility], [place] [address], [place], [place] [address]    CULT  09/21/2020 [phone]    NO GROWTH 5 DAYS Performed at [facility] [address] [place], [place] [address]    REPTSTATUS 09/26/2020 FINAL 09/21/2020 0851   REPTSTATUS 09/26/2020 FINAL 09/21/2020  0851    Other culture-see note  Medications: Scheduled Meds: . (feeding supplement) PROSource Plus  30 mL Oral TID BM  . calcitRIOL      . calcitRIOL  2.5 mcg Oral Q M,W,F-HD  . Chlorhexidine Gluconate Cloth  6 each Topical Q0600  . Chlorhexidine Gluconate Cloth  6 each Topical Q0600  . cinacalcet  60 mg Oral Q breakfast  . darbepoetin (ARANESP) injection - DIALYSIS  150 mcg Intravenous Q Fri-HD  . dextrose      . fentaNYL  1 patch Transdermal Q72H  . heparin  5,000 Units Subcutaneous Q8H  . heparin sodium (porcine)      . insulin aspart  0-6 Units  Subcutaneous TID WC  . multivitamin  1 tablet Oral QHS  . oxyCODONE      . sevelamer carbonate  4,000 mg Oral TID WC   Continuous Infusions:  Antimicrobials: Anti-infectives (From admission, onward)   Start     Dose/Rate Route Frequency Ordered Stop   09/25/20 1800  ceFAZolin (ANCEF) IVPB 1 g/50 mL premix  Status:  Discontinued        1 g 100 mL/hr over 30 Minutes Intravenous Every 24 hours 09/25/20 1100 09/29/20 1056   09/23/20 1200  ceFAZolin (ANCEF) IVPB 2g/100 mL premix  Status:  Discontinued       "Followed by" Linked Group Details   2 g 200 mL/hr over 30 Minutes Intravenous Every M-W-F (Hemodialysis) 09/22/20 1029 09/25/20 1100   09/22/20 1115  ceFAZolin (ANCEF) IVPB 2g/100 mL premix       "Followed by" Linked Group Details   2 g 200 mL/hr over 30 Minutes Intravenous  Once 09/22/20 1029 09/22/20 2318   09/19/20 1254  Vancomycin (VANCOCIN) 750-5 MG/150ML-% IVPB       Note to Pharmacy: Judieth Keens  : cabinet override      09/19/20 1254 09/19/20 1317   09/19/20 1200  vancomycin (VANCOCIN) IVPB 750 mg/150 ml premix        750 mg 150 mL/hr over 60 Minutes Intravenous Every T-Th-Sa (Hemodialysis) 09/19/20 1034 09/19/20 1414   09/18/20 0800  Ampicillin-Sulbactam (UNASYN) 3 g in sodium chloride 0.9 % 100 mL IVPB  Status:  Discontinued        3 g 200 mL/hr over 30 Minutes Intravenous Every 12 hours 09/17/20 1226 09/22/20 1029   09/13/20 0933  Vancomycin (VANCOCIN) 750-5 MG/150ML-% IVPB       Note to Pharmacy: Herriott, Melisa   : cabinet override      09/13/20 0933 09/13/20 2144   09/11/20 1200  vancomycin (VANCOCIN) IVPB 750 mg/150 ml premix  Status:  Discontinued        750 mg 150 mL/hr over 60 Minutes Intravenous Every M-W-F (Hemodialysis) 09/11/20 0238 09/20/20 1134   09/11/20 1130  ceFEPIme (MAXIPIME) 1 g in sodium chloride 0.9 % 100 mL IVPB        1 g 200 mL/hr over 30 Minutes Intravenous Every 24 hours 09/11/20 0919 09/17/20 0921   09/11/20 0300   piperacillin-tazobactam (ZOSYN) IVPB 2.25 g  Status:  Discontinued        2.25 g 100 mL/hr over 30 Minutes Intravenous Every 8 hours 09/11/20 0238 09/11/20 0858   09/11/20 0300  vancomycin (VANCOREADY) IVPB 1250 mg/250 mL        1,250 mg 166.7 mL/hr over 90 Minutes Intravenous  Once 09/11/20 0238 09/11/20 0613     Objective: Vitals: Today's Vitals   10/09/20 1055 10/09/20 1100 10/09/20 1105 10/09/20 1110  BP:  106/62    Pulse:  94    Resp: 18 12 16 17   Temp:      TempSrc:      SpO2:      Weight:      Height:      PainSc:        Intake/Output Summary (Last 24 hours) at 10/09/2020 1129 Last data filed at 10/09/2020 1126 Gross per 24 hour  Intake 50 ml  Output -  Net 50 ml   Filed Weights   10/04/20 1140 10/06/20 1245 10/07/20 1100  Weight: 79.4 kg 83.6 kg 80.3 kg   Weight change:   Intake/Output from previous day: No intake/output data recorded. Intake/Output this shift: Total I/O In: 10 [IV Piggyback:50] Out: -   Examination: General exam: AAOx4, old for his age, weak appearing. HEENT:Oral mucosa moist, Ear/Nose WNL grossly, dentition normal. Respiratory system: bilaterally clear,no wheezing or crackles,no use of accessory muscle Cardiovascular system: S1 & S2 +, No JVD,. Gastrointestinal system: Abdomen soft, NT,ND, BS+ Nervous System:Alert, awake, moving extremities and grossly nonfocal Extremities: Left BKA intact stable, negative drainage or foul smell Skin: No rashes,no icterus. MSK: Normal muscle bulk,tone, power HD catheter on chest-clean dry intact     Data Reviewed: I have personally reviewed following labs and imaging studies CBC: Recent Labs  Lab 10/04/20 0619 10/05/20 0159 10/07/20 1303 10/09/20 0304  WBC 9.8 9.5 8.4 7.6  HGB 7.0* 7.1* 7.2* 7.1*  HCT 23.9* 23.9* 24.3* 25.3*  MCV 99.6 98.8 99.2 100.4*  PLT 292 236 237 950   Basic Metabolic Panel: Recent Labs  Lab 10/04/20 0619 10/05/20 0159 10/06/20 0908 10/07/20 0929 10/09/20 0304   NA 133* 136 130* 131* 131*  K 6.7* 6.3* >7.5* 6.4* 6.7*  CL 98 99 95* 96* 93*  CO2 27 27 20* 21* 29  GLUCOSE 154* 177* 289* 148* 227*  BUN 46* 32* 62* 48* 47*  CREATININE 6.72* 5.43* 7.17* 5.76* 6.02*  CALCIUM 8.8* 9.2 9.2 8.9 9.0  MG  --  2.4  --   --   --   PHOS 4.1  --  4.8* 4.1  --    GFR: Estimated Creatinine Clearance: 16.4 mL/min (A) (by C-G formula based on SCr of 6.02 mg/dL (H)). Liver Function Tests: Recent Labs  Lab 10/04/20 0619 10/06/20 0908 10/07/20 0929  ALBUMIN 2.0* 2.3* 2.3*   No results for input(s): LIPASE, AMYLASE in the last 168 hours. No results for input(s): AMMONIA in the last 168 hours. Coagulation Profile: No results for input(s): INR, PROTIME in the last 168 hours. Cardiac Enzymes: No results for input(s): CKTOTAL, CKMB, CKMBINDEX, TROPONINI in the last 168 hours. BNP (last 3 results) No results for input(s): PROBNP in the last 8760 hours. HbA1C: No results for input(s): HGBA1C in the last 72 hours. CBG: Recent Labs  Lab 10/08/20 1139 10/08/20 1806 10/08/20 1952 10/09/20 0651 10/09/20 1119  GLUCAP 256* 442* 372* 172* 124*   Lipid Profile: No results for input(s): CHOL, HDL, LDLCALC, TRIG, CHOLHDL, LDLDIRECT in the last 72 hours. Thyroid Function Tests: No results for input(s): TSH, T4TOTAL, FREET4, T3FREE, THYROIDAB in the last 72 hours. Anemia Panel: No results for input(s): VITAMINB12, FOLATE, FERRITIN, TIBC, IRON, RETICCTPCT in the last 72 hours. Sepsis Labs: No results for input(s): PROCALCITON, LATICACIDVEN in the last 168 hours.  No results found for this or any previous visit (from the past 240 hour(s)).   Radiology Studies: No results found.   LOS: 28 days   Antonieta Pert,  MD [facility]  10/09/2020, 11:29 AM

## 2020-10-10 DIAGNOSIS — F4320 Adjustment disorder, unspecified: Secondary | ICD-10-CM | POA: Diagnosis not present

## 2020-10-10 LAB — GLUCOSE, CAPILLARY
Glucose-Capillary: 237 mg/dL — ABNORMAL HIGH (ref 70–99)
Glucose-Capillary: 310 mg/dL — ABNORMAL HIGH (ref 70–99)
Glucose-Capillary: 287 mg/dL — ABNORMAL HIGH (ref 70–99)
Glucose-Capillary: 217 mg/dL — ABNORMAL HIGH (ref 70–99)

## 2020-10-10 MED ORDER — CHLORHEXIDINE GLUCONATE CLOTH 2 % EX PADS: 6.0000 | MEDICATED_PAD | Freq: Every day | CUTANEOUS | Status: DC

## 2020-10-10 NOTE — Progress Notes (Signed)
Physical Therapy Treatment Patient Details Name: [name] MRN: [id number] DOB: 03-09-1981 Today's Date: 10/10/2020    History of Present Illness Patient is a 40 y/o male with PMH significant for DM Type 1, gastroparesis, HTN, ESRD, chronic L foot diabetic wound infection. Patient admitted for sepsis 2/2 acute osteomyelitis involving L ankle and foot. 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    PT Comments    Session focused on stair training. Patient negotiated 3 stairs backwards on buttocks, problem solved with patient about putting chair on top of stairs to assist with standing from ground. Patient continues to refuse assistance. Educated patient about asking for help when he returns home for energy conservation especially on HD days. Patient continues to present with decreased activity tolerance, impaired balance, generalized weakness, and impaired functional mobility. Continue to recommend SNF, however patient plan is to return home, recommend HHPT following discharge.      Follow Up Recommendations  SNF;Supervision/Assistance - 24 hour     Equipment Recommendations  Rolling Vernessa Likes with 5" wheels;3in1 (PT);Wheelchair (measurements PT);Wheelchair cushion (measurements PT)    Recommendations for Other Services       Precautions / Restrictions Precautions Precautions: Fall Precaution Comments: 3+ falls during this admission Restrictions Weight Bearing Restrictions: Yes LLE Weight Bearing: Non weight bearing    Mobility  Bed Mobility Overal bed mobility: Modified Independent             General bed mobility comments: pt seated EOB on PTA arrival to room  Transfers Overall transfer level: Needs assistance Equipment used: Rolling [name] (2 wheeled) Transfers: Sit to/from Stand Sit to Stand: Supervision   Squat pivot transfers: Supervision     General transfer comment: supervision for safety, refuses physical  assistance  Ambulation/Gait Ambulation/Gait assistance: Supervision Gait Distance (Feet): 10 Feet Assistive device: Rolling Iden [name] (2 wheeled) Gait Pattern/deviations: Decreased stride length (hop to pattern)     General Gait Details: supervision for safety, refuses to be touched   Stairs Stairs: Yes Stairs assistance: Supervision Stair Management: Backwards;Two rails (on buttocks) Number of Stairs: 3 General stair comments: Instructed patient on negotiating stairs backwards on buttocks. Patient able to ascend and descend steps using UEs. Problem solved with patient on returning to standing at top of stairs. Instructed patient on putting chair on top of stairs to assist with standing from ground. No physical assistance required due to patient refusing to be touched. Instructed patient to have someone bring RW/wheelchair to top of stairs for him.   Wheelchair Mobility    Modified Rankin (Stroke Patients Only)       Balance Overall balance assessment: Needs assistance Sitting-balance support: No upper extremity supported Sitting balance-[name]: Good     Standing balance support: Bilateral upper extremity supported;During functional activity Standing balance-[name]: Poor                              Cognition Arousal/Alertness: Awake/alert Behavior During Therapy: WFL for tasks assessed/performed Overall Cognitive Status: Impaired/Different from baseline Area of Impairment: Memory;Safety/judgement                     Memory: Decreased short-term memory   Safety/Judgement: Decreased awareness of safety;Decreased awareness of deficits   Problem Solving: Requires verbal cues;Difficulty sequencing General Comments: pt with unsafe behaviors during mobility using RW, becomes agiated and argumentative when corrected      Exercises  General Comments        Pertinent Vitals/Pain Pain Assessment: Faces Faces Pain Scale: No hurt Pain  Intervention(s): Monitored during session    Home Living                      Prior Function            PT Goals (current goals can now be found in the care plan section) Acute Rehab PT Goals Patient Stated Goal: to go home PT Goal Formulation: With patient Time For Goal Achievement: 10/12/20 Potential to Achieve Goals: Fair Progress towards PT goals: Progressing toward goals    Frequency    Min 3X/week      PT Plan Current plan remains appropriate    Co-evaluation              AM-PAC PT "6 Clicks" Mobility   Outcome Measure  Help needed turning from your back to your side while in a flat bed without using bedrails?: None Help needed moving from lying on your back to sitting on the side of a flat bed without using bedrails?: None Help needed moving to and from a bed to a chair (including a wheelchair)?: A Little Help needed standing up from a chair using your arms (e.g., wheelchair or bedside chair)?: A Little Help needed to walk in hospital room?: A Little Help needed climbing 3-5 steps with a railing? : A Little 6 Click Score: 20    End of Session   Activity Tolerance: Patient tolerated treatment well Patient left: in bed;with call bell/phone within reach Nurse Communication: Mobility status PT Visit Diagnosis: Unsteadiness on feet (R26.81);Other abnormalities of gait and mobility (R26.89);Muscle weakness (generalized) (M62.81);Difficulty in walking, not elsewhere classified (R26.2)     Time: 9147-8295 PT Time Calculation (min) (ACUTE ONLY): 56 min  Charges:  $Therapeutic Activity: 53-67 mins                     [place] Kappes A. Gilford Rile PT, DPT Acute Rehabilitation Services Pager [phone] Office [phone]    Alda Lea 10/10/2020, 3:51 PM

## 2020-10-10 NOTE — Progress Notes (Signed)
PROGRESS NOTE    [name]  STM:[id number] DOB: 08/30/1981 DOA: 09/10/2020 PCP: Kerin Perna, NP   Chief Complaint  Patient presents with  . Foot Pain    Brief Narrative: 40 year old male with complex comorbidities including ESRD on dialysis, bipolar disorder, narcotic seeking behavior, diabetes, hypertension admitted on 09/10/2020 with acute osteomyelitis of left ankle and foot along with sepsis.  Patient had complicated and prolonged hospitalization, he has had left BKA BY dR eARLY ON 09/16/20,and I&D of left wrist abscess. He has had difficulty placement due to uncontrolled pain/narcotic seeking behavior and need for skilled nursing facility neardialysis center. Patient had ongoing issues with uncontrolled hyperglycemia resulting into need for more insulin and hypoglycemia, has had issues with hyperkalemia and severe dietary noncompliance and would get very aggressive on changing his diet regimen or any pain regimen. He is off Lantus remains on sliding scale insulin with no more hypoglycemia.  Subjective:  Seen this morning Resting comfortably pain is controlled.  No new complaints.  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  Had Enterococcus and Proteus from the culture.  Patient had completed antibiotic course.  Is followed by podiatry.vascular has a small area of wound dehiscence, that is having granulation tissue. Staples intact.  Continue wound care, PT OT.  We will notify vascular that patient is planning to go home, no SNF available.  Currently remains on oxycodone 20 mg every 3 hours, Dilaudid 0.5 mg every 6 hours and fentanyl patch 25 MCG.  We discussed about cutting down on his narcotics and he is adamantly/aggressively against it , states pain is well controlled on this regimen. Will let muscular know for his desire to go home and for outpatient pain  regimen, will leave daily pain clinic follow-up, TOC consulted.  Equipment being delivered to home tomorrow. We discussed abt Leurontin/lyrica and Tylenol-  He sates " I am good on my med for now".  ESRD on HD MWF last HD 1/12.  Potassium improved  Hyperkalemia ss/p temporizing measures and HD 1/12-potassium improved.  Continue Lokelma as per nephrology.  Hyperkalemia complicated also due to dietary indiscretion Recent Labs  Lab 10/05/20 0159 10/06/20 0908 10/07/20 0929 10/09/20 0304 10/09/20 1311  K 6.3* >7.5* 6.4* 6.7* 4.9  Secondary hyperparathyroidism continue binders/Sensipar  Left wrist abscess s/p post I&D on 12/19 with MSSA completed antibiotics.  Diabetes mellitus w/ uncontrolled hyperglycemia and brittle with episode of hypoglycemia: Off Lantus due to episodes of hypoglycemia.Hemoglobin A1c 7.3 on 12/29.  Blood sugar fluctuating but no more hypoglycemia remains on sliding scale insulin continue same.  Blood sugar remains poorly controlled due to dietary indiscretion, multiple attempts to counsel on diet has been unsuccessful.  Patient understands complications of hyperglycemia including delay in wound healing/risk of infection and further complication of BKA-he is alert awake oriented x3.  I am afraid if he continues to be noncompliant he will have complications moving forward in the future.  Continue on current sliding scale insulin Recent Labs  Lab 10/09/20 0651 10/09/20 1119 10/09/20 1830 10/09/20 2103 10/10/20 0632  GLUCAP 172* 124* 197* 156* 237*   Lab Results  Component Value Date   HGBA1C 7.3 (H) 09/25/2020   Anemia of chronic disease received 1 unit PRBC with dialysis during this admission.@Is  7.1 g.  Stable.  Checking a.m.  Recent Labs  Lab 10/04/20 0619 10/05/20 0159 10/07/20 1303 10/09/20 0304  HGB 7.0* 7.1* 7.2* 7.1*  HCT 23.9* 23.9* 24.3* 25.3*   Possible bipolar disorder: At times patient gets easily agitated/aggressive specifically when talking about  his pain regimen as well as diet restriction. He is alert awake oriented x3. He has had threatening behavior during hospitalization and required IVC, psychiatry was consulted, was not a candidate for inpatient psychiatric transfer.   HTN: BP stable   Poor compliance with diet/poor compliance with treatment: I stayed next to his bed to engage in conversation including on 10/09/20- discussed readarding his uncontrolled hyperglycemia dietary noncompliance and pain regimen and also about his recovery after BKA about wound healing and overall quality of life moving forward: He is aware about his clinical condition and also was possible complications of hyperglycemia and side effects of opiates, he stated to me " I have been in the jail 4 times so I understand the consequences of my action and I have been living with diabetes since age of 52" " I have been eating like this for many years and I will keep doing the same" " I understand high blood sugar will cause delay in wound healing and also cause high risk of infection and further complication"  Nutrition: Diet Order            Diet regular Room service appropriate? Yes; Fluid consistency: Thin  Diet effective now                 Nutrition Problem: Moderate Malnutrition Etiology: chronic illness (ESRD on HD) Signs/Symptoms: mild fat depletion,moderate fat depletion,moderate muscle depletion,severe muscle depletion Interventions: Prostat,MVI  Body mass index is 25.55 kg/m.  DVT prophylaxis: SCD's Start: 09/15/20 0359 heparin injection 5,000 Units Start: 09/11/20 0600 Code Status:   Code Status: Full Code Family Communication: plan of care discussed with patient at bedside.  Status is: Inpatient  Remains inpatient appropriate because:Unsafe d/c plan and Inpatient level of care appropriate due to severity of illness  Dispo:  Patient From: Home  Planned Disposition: [place] vs HHC.  "I will be going home home"  Expected  discharge date:1- 2 days  Medically stable for discharge: noe  Consultants:see note  Procedures:see note  Culture/Microbiology    Component Value Date/Time   SDES BLOOD RIGHT ANTECUBITAL 09/21/2020 0851   SDES BLOOD RIGHT ANTECUBITAL 09/21/2020 0851   SPECREQUEST  09/21/2020 0851    BOTTLES DRAWN AEROBIC AND ANAEROBIC Blood Culture adequate volume   SPECREQUEST  09/21/2020 0851    BOTTLES DRAWN AEROBIC AND ANAEROBIC Blood Culture results may not be optimal due to an inadequate volume of blood received in culture bottles   CULT  09/21/2020 0851    NO GROWTH 5 DAYS Performed at [facility], [place] [address], [place], [place] [address]    CULT  09/21/2020 [phone]    NO GROWTH 5 DAYS Performed at [facility] [address], [name], [place] [address]    REPTSTATUS 09/26/2020 FINAL 09/21/2020 0851   REPTSTATUS 09/26/2020 FINAL 09/21/2020 0851    Other culture-see note  Medications: Scheduled Meds: . (feeding supplement) PROSource Plus  30 mL Oral TID BM  . calcitRIOL  2.5 mcg Oral Q M,W,F-HD  . Chlorhexidine Gluconate Cloth  6 each Topical Q0600  . Chlorhexidine Gluconate Cloth  6 each Topical Q0600  . [START ON 10/11/2020] Chlorhexidine Gluconate Cloth  6 each Topical Q0600  . cinacalcet  60 mg Oral Q breakfast  . darbepoetin (ARANESP) injection - DIALYSIS  150 mcg Intravenous Q Fri-HD  . fentaNYL  1 patch  Transdermal Q72H  . heparin  5,000 Units Subcutaneous Q8H  . insulin aspart  0-6 Units Subcutaneous TID WC  . multivitamin  1 tablet Oral QHS  . sevelamer carbonate  4,000 mg Oral TID WC  . sodium zirconium cyclosilicate  10 g Oral BID   Continuous Infusions:  Antimicrobials: Anti-infectives (From admission, onward)   Start     Dose/Rate Route Frequency Ordered Stop   09/25/20 1800  ceFAZolin (ANCEF) IVPB 1 g/50 mL premix  Status:  Discontinued        1 g 100 mL/hr over 30 Minutes Intravenous Every 24 hours 09/25/20 1100 09/29/20 1056   09/23/20 1200   ceFAZolin (ANCEF) IVPB 2g/100 mL premix  Status:  Discontinued       "Followed by" Linked Group Details   2 g 200 mL/hr over 30 Minutes Intravenous Every M-W-F (Hemodialysis) 09/22/20 1029 09/25/20 1100   09/22/20 1115  ceFAZolin (ANCEF) IVPB 2g/100 mL premix       "Followed by" Linked Group Details   2 g 200 mL/hr over 30 Minutes Intravenous  Once 09/22/20 1029 09/22/20 2318   09/19/20 1254  Vancomycin (VANCOCIN) 750-5 MG/150ML-% IVPB       Note to Pharmacy: Judieth Keens  : cabinet override      09/19/20 1254 09/19/20 1317   09/19/20 1200  vancomycin (VANCOCIN) IVPB 750 mg/150 ml premix        750 mg 150 mL/hr over 60 Minutes Intravenous Every T-Th-Sa (Hemodialysis) 09/19/20 1034 09/19/20 1414   09/18/20 0800  Ampicillin-Sulbactam (UNASYN) 3 g in sodium chloride 0.9 % 100 mL IVPB  Status:  Discontinued        3 g 200 mL/hr over 30 Minutes Intravenous Every 12 hours 09/17/20 1226 09/22/20 1029   09/13/20 0933  Vancomycin (VANCOCIN) 750-5 MG/150ML-% IVPB       Note to Pharmacy: Herriott, Melisa   : cabinet override      09/13/20 0933 09/13/20 2144   09/11/20 1200  vancomycin (VANCOCIN) IVPB 750 mg/150 ml premix  Status:  Discontinued        750 mg 150 mL/hr over 60 Minutes Intravenous Every M-W-F (Hemodialysis) 09/11/20 0238 09/20/20 1134   09/11/20 1130  ceFEPIme (MAXIPIME) 1 g in sodium chloride 0.9 % 100 mL IVPB        1 g 200 mL/hr over 30 Minutes Intravenous Every 24 hours 09/11/20 0919 09/17/20 0921   09/11/20 0300  piperacillin-tazobactam (ZOSYN) IVPB 2.25 g  Status:  Discontinued        2.25 g 100 mL/hr over 30 Minutes Intravenous Every 8 hours 09/11/20 0238 09/11/20 0858   09/11/20 0300  vancomycin (VANCOREADY) IVPB 1250 mg/250 mL        1,250 mg 166.7 mL/hr over 90 Minutes Intravenous  Once 09/11/20 0238 09/11/20 0613     Objective: Vitals: Today's Vitals   10/10/20 0449 10/10/20 0521 10/10/20 0652 10/10/20 0746  BP: (!) 107/51   124/60  Pulse: (!) 102   97   Resp: 17   17  Temp: 100.2 F (37.9 C)   (!) 97.5 F (36.4 C)  TempSrc: Oral   Oral  SpO2: 90%   99%  Weight:      Height:      PainSc:  9  8      Intake/Output Summary (Last 24 hours) at 10/10/2020 1336 Last data filed at 10/10/2020 0930 Gross per 24 hour  Intake 240 ml  Output 300 ml  Net -60 ml  Filed Weights   10/07/20 1100 10/09/20 0900 10/09/20 1215  Weight: 80.3 kg 80.1 kg 74 kg   Weight change:   Intake/Output from previous day: 01/12 0701 - 01/13 0700 In: 50 [IV Piggyback:50] Out: 6300 [Urine:300] Intake/Output this shift: Total I/O In: 240 [P.O.:240] Out: -   Examination: General exam: AAOx3, old for age,NAD, weak appearing. HEENT:Oral mucosa moist, Ear/Nose WNL grossly, dentition normal. Respiratory system: bilaterally clear,no wheezing or crackles,no use of accessory muscle Cardiovascular system: S1 & S2 +, No JVD,. Gastrointestinal system: Abdomen soft, NT,ND, BS+ Nervous System:Alert, awake, moving extremities and grossly nonfocal Extremities:  lft bka w/ dressing intact Skin: No rashes,no icterus. MSK: Normal muscle bulk,tone, power HD catheter on chest-clean dry intact     Data Reviewed: I have personally reviewed following labs and imaging studies CBC: Recent Labs  Lab 10/04/20 0619 10/05/20 0159 10/07/20 1303 10/09/20 0304  WBC 9.8 9.5 8.4 7.6  HGB 7.0* 7.1* 7.2* 7.1*  HCT 23.9* 23.9* 24.3* 25.3*  MCV 99.6 98.8 99.2 100.4*  PLT 292 236 237 937   Basic Metabolic Panel: Recent Labs  Lab 10/04/20 0619 10/05/20 0159 10/06/20 0908 10/07/20 0929 10/09/20 0304 10/09/20 1311  NA 133* 136 130* 131* 131*  --   K 6.7* 6.3* >7.5* 6.4* 6.7* 4.9  CL 98 99 95* 96* 93*  --   CO2 27 27 20* 21* 29  --   GLUCOSE 154* 177* 289* 148* 227*  --   BUN 46* 32* 62* 48* 47*  --   CREATININE 6.72* 5.43* 7.17* 5.76* 6.02*  --   CALCIUM 8.8* 9.2 9.2 8.9 9.0  --   MG  --  2.4  --   --   --   --   PHOS 4.1  --  4.8* 4.1  --   --    GFR: Estimated  Creatinine Clearance: 15.1 mL/min (A) (by C-G formula based on SCr of 6.02 mg/dL (H)). Liver Function Tests: Recent Labs  Lab 10/04/20 0619 10/06/20 0908 10/07/20 0929  ALBUMIN 2.0* 2.3* 2.3*   No results for input(s): LIPASE, AMYLASE in the last 168 hours. No results for input(s): AMMONIA in the last 168 hours. Coagulation Profile: No results for input(s): INR, PROTIME in the last 168 hours. Cardiac Enzymes: No results for input(s): CKTOTAL, CKMB, CKMBINDEX, TROPONINI in the last 168 hours. BNP (last 3 results) No results for input(s): PROBNP in the last 8760 hours. HbA1C: No results for input(s): HGBA1C in the last 72 hours. CBG: Recent Labs  Lab 10/09/20 0651 10/09/20 1119 10/09/20 1830 10/09/20 2103 10/10/20 0632  GLUCAP 172* 124* 197* 156* 237*   Lipid Profile: No results for input(s): CHOL, HDL, LDLCALC, TRIG, CHOLHDL, LDLDIRECT in the last 72 hours. Thyroid Function Tests: No results for input(s): TSH, T4TOTAL, FREET4, T3FREE, THYROIDAB in the last 72 hours. Anemia Panel: No results for input(s): VITAMINB12, FOLATE, FERRITIN, TIBC, IRON, RETICCTPCT in the last 72 hours. Sepsis Labs: No results for input(s): PROCALCITON, LATICACIDVEN in the last 168 hours.  No results found for this or any previous visit (from the past 240 hour(s)).   Radiology Studies: No results found.   LOS: 47 days   Antonieta Pert, MD [facility]  10/10/2020, 1:36 PM

## 2020-10-10 NOTE — Care Management Important Message (Signed)
Important Message  Patient Details  Name: [name] MRN: [id number] Date of Birth: Jan 29, 1981   Medicare Important Message Given:  Yes     [name] [name] 10/10/2020, 3:27 PM

## 2020-10-10 NOTE — Progress Notes (Signed)
Occupational Therapy Treatment Patient Details Name: [name] MRN: [id number] DOB: Mar 15, 1981 Today's Date: 10/10/2020    History of present illness Patient is a 40 y/o male with PMH significant for DM Type 1, gastroparesis, HTN, ESRD, chronic L foot diabetic wound infection. Patient admitted for sepsis 2/2 acute osteomyelitis involving L ankle and foot. 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   OT comments  Pt making progress with functional goals. Pt with poor safety awareness and becomes agitated when corrects while participating in standing tasks and using RW unsafely. OT will continue to follow acutely to maximize level of function and safety  Follow Up Recommendations  Supervision/Assistance - 24 hour;SNF    Equipment Recommendations  3 in 1 bedside commode;Other (comment);Tub/shower bench;Wheelchair (measurements OT);Wheelchair cushion (measurements OT)    Recommendations for Other Services      Precautions / Restrictions Precautions Precautions: Fall Restrictions Weight Bearing Restrictions: Yes LLE Weight Bearing: Partial weight bearing       Mobility Bed Mobility Overal bed mobility: Modified Independent             General bed mobility comments: pt seated EOB on PTA arrival to room  Transfers Overall transfer level: Needs assistance Equipment used: Rolling walker (2 wheeled) Transfers: Sit to/from Stand Sit to Stand: Supervision   Squat pivot transfers: Supervision     General transfer comment: supervision for safety, refuses physical assistance; therapist hand on RW to steady it only. Pt reaching out for tray table and opening bathroom door unsafely while using RW    Balance Overall balance assessment: Needs assistance Sitting-balance support: No upper extremity supported Sitting balance-[name]: Good     Standing balance support: Bilateral upper extremity supported;During functional activity Standing balance-[name]:  Poor                             ADL either performed or assessed with clinical judgement   ADL Overall ADL's : Needs assistance/impaired             Lower Body Bathing: Min guard;Sit to/from stand       Lower Body Dressing: Supervision/safety;Sit to/from stand   Toilet Transfer: Supervision/safety;Stand-pivot;Ambulation;RW;Regular Toilet   Toileting- Water quality scientist and Hygiene: Supervision/safety;Sit to/from stand Toileting - Clothing Manipulation Details (indicate cue type and reason): clothing mgt     Functional mobility during ADLs: Rolling walker;Supervision/safety       Vision Baseline Vision/History: No visual deficits Patient Visual Report: No change from baseline     Perception     Praxis      Cognition Arousal/Alertness: Awake/alert Behavior During Therapy: WFL for tasks assessed/performed Overall Cognitive Status: Impaired/Different from baseline Area of Impairment: Memory;Safety/judgement                     Memory: Decreased short-term memory   Safety/Judgement: Decreased awareness of safety;Decreased awareness of deficits   Problem Solving: Requires verbal cues;Difficulty sequencing General Comments: pt with unsafe behaviors during mobility using RW, becomes agiated and argumentative when corrected        Exercises     Shoulder Instructions       General Comments      Pertinent Vitals/ Pain       Pain Assessment: No/denies pain Faces Pain Scale: No hurt Pain Intervention(s): Monitored during session  Home Living  Prior Functioning/Environment              Frequency  Min 2X/week        Progress Toward Goals  OT Goals(current goals can now be found in the care plan section)  Progress towards OT goals: Progressing toward goals  Acute Rehab OT Goals Patient Stated Goal: to go home  Plan Discharge plan remains appropriate     Co-evaluation                 AM-PAC OT "6 Clicks" Daily Activity     Outcome Measure   Help from another person eating meals?: None Help from another person taking care of personal grooming?: A Little Help from another person toileting, which includes using toliet, bedpan, or urinal?: A Little Help from another person bathing (including washing, rinsing, drying)?: A Little Help from another person to put on and taking off regular upper body clothing?: None Help from another person to put on and taking off regular lower body clothing?: A Little 6 Click Score: 20    End of Session Equipment Utilized During Treatment: Rolling walker  OT Visit Diagnosis: Unsteadiness on feet (R26.81);Other abnormalities of gait and mobility (R26.89);Other symptoms and signs involving cognitive function   Activity Tolerance Patient tolerated treatment well   Patient Left with call bell/phone within reach;Other (comment) (seated in w/c)   Nurse Communication          Time: 7680-8811 OT Time Calculation (min): 16 min  Charges: OT General Charges $OT Visit: 1 Visit OT Treatments $Self Care/Home Management : 8-22 mins     Britt Bottom 10/10/2020, 2:33 PM

## 2020-10-10 NOTE — Progress Notes (Signed)
[facility] Progress Note   Subjective:   Patient seen and examined at bedside.  No new complaints.  Hopeful to d/c home tomorrow.   Objective Vitals:   10/09/20 1600 10/09/20 2059 10/10/20 0449 10/10/20 0746  BP: 111/62 120/64 (!) 107/51 124/60  Pulse: 98 (!) 102 (!) 102 97  Resp: 17 16 17 17   Temp: 98.7 F (37.1 C) 97.6 F (36.4 C) 100.2 F (37.9 C) (!) 97.5 F (36.4 C)  TempSrc: Oral Oral Oral Oral  SpO2: 100% 98% 90% 99%  Weight:      Height:       Physical Exam General:WDWN male in NAD Heart:RRR Lungs:CTAB, nml WOB Abdomen:soft, NTND Extremities:no LE edema, L BKA Dialysis Access: [facility]   Filed Weights   10/07/20 1100 10/09/20 0900 10/09/20 1215  Weight: 80.3 kg 80.1 kg 74 kg    Intake/Output Summary (Last 24 hours) at 10/10/2020 1202 Last data filed at 10/10/2020 0930 Gross per 24 hour  Intake 240 ml  Output 6300 ml  Net -6060 ml    Additional Objective Labs: Basic Metabolic Panel: Recent Labs  Lab 10/04/20 0619 10/05/20 0159 10/06/20 0908 10/07/20 0929 10/09/20 0304 10/09/20 1311  NA 133*   < > 130* 131* 131*  --   K 6.7*   < > >7.5* 6.4* 6.7* 4.9  CL 98   < > 95* 96* 93*  --   CO2 27   < > 20* 21* 29  --   GLUCOSE 154*   < > 289* 148* 227*  --   BUN 46*   < > 62* 48* 47*  --   CREATININE 6.72*   < > 7.17* 5.76* 6.02*  --   CALCIUM 8.8*   < > 9.2 8.9 9.0  --   PHOS 4.1  --  4.8* 4.1  --   --    < > = values in this interval not displayed.   Liver Function Tests: Recent Labs  Lab 10/04/20 0619 10/06/20 0908 10/07/20 0929  ALBUMIN 2.0* 2.3* 2.3*   CBC: Recent Labs  Lab 10/04/20 0619 10/05/20 0159 10/07/20 1303 10/09/20 0304  WBC 9.8 9.5 8.4 7.6  HGB 7.0* 7.1* 7.2* 7.1*  HCT 23.9* 23.9* 24.3* 25.3*  MCV 99.6 98.8 99.2 100.4*  PLT 292 236 237 162   CBG: Recent Labs  Lab 10/09/20 0651 10/09/20 1119 10/09/20 1830 10/09/20 2103 10/10/20 0632  GLUCAP 172* 124* 197* 156* 237*    Medications:  . (feeding  supplement) PROSource Plus  30 mL Oral TID BM  . calcitRIOL  2.5 mcg Oral Q M,W,F-HD  . Chlorhexidine Gluconate Cloth  6 each Topical Q0600  . Chlorhexidine Gluconate Cloth  6 each Topical Q0600  . cinacalcet  60 mg Oral Q breakfast  . darbepoetin (ARANESP) injection - DIALYSIS  150 mcg Intravenous Q Fri-HD  . fentaNYL  1 patch Transdermal Q72H  . heparin  5,000 Units Subcutaneous Q8H  . insulin aspart  0-6 Units Subcutaneous TID WC  . multivitamin  1 tablet Oral QHS  . sevelamer carbonate  4,000 mg Oral TID WC  . sodium zirconium cyclosilicate  10 g Oral BID    Dialysis Orders: MWF @ [facility] 4h 72min 400/800 67kg TDC Hep 5000 + 5000 midrun - Calcitriol 26mcg PO q HD - No ESA ** Scheduled for R BC AVF on 09/24/20, canceled/delayed per patient request.  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 Issues with pain control/phantom  leg pain- per primary team. 2. Hyperkalemia-Ongoing issuesince he was changed to regular diet. Refusing to change back to renal diet.  Aware of dangers. Follow labs.Lokelma BID.  K improved 4.9 today.  3. ESRD:Usual MWF schedule-HD tomorrow per regular schedule. 4.HTN/volume:BP in goal.Volume overloaded, large gains. Counseled on fluid restrictions. UF as tolerated. 5. Anemia:S/p1U PRBCs on 12/27andAranesp dose increased to 150 q Friday. Hgb stable at 7.1.Recheck iron studies with HD. FOBT negative. Transfuse prn 6. Secondary hyperparathyroidism:CorrCa/Phos good- continue binders/VDRA/sensipar. 7. Nutrition:Alb low - continue protein supplements. 8. Bipolar disorder + underlying personality disorder:Severe aggressionearlier this admit, continues to show aggression but not as severe. Psych consulted on 12/24 - did not meet criteria for inpatient psych. 9.L wrist abscess: S/p I&D 12/19 -now s/p antibiotic course. 10. T2DM: Chronically  uncontrolled. 11: Itching/papules:Chronic per pt. Pruritis can be due to chronically high phosphorus/uremia. If persistent, outpatient dermatology consult may be appropriate 12. Dispo: Now planning to go home with home health  Jen Mow, [place] 10/10/2020,12:02 PM  LOS: 29 days

## 2020-10-11 ENCOUNTER — Telehealth (INDEPENDENT_AMBULATORY_CARE_PROVIDER_SITE_OTHER): Payer: Self-pay | Admitting: Primary Care

## 2020-10-11 DIAGNOSIS — E1129 Type 2 diabetes mellitus with other diabetic kidney complication: Secondary | ICD-10-CM | POA: Diagnosis not present

## 2020-10-11 DIAGNOSIS — M869 Osteomyelitis, unspecified: Secondary | ICD-10-CM | POA: Diagnosis not present

## 2020-10-11 DIAGNOSIS — I120 Hypertensive chronic kidney disease with stage 5 chronic kidney disease or end stage renal disease: Secondary | ICD-10-CM | POA: Diagnosis not present

## 2020-10-11 DIAGNOSIS — Z992 Dependence on renal dialysis: Secondary | ICD-10-CM | POA: Diagnosis not present

## 2020-10-11 DIAGNOSIS — D649 Anemia, unspecified: Secondary | ICD-10-CM | POA: Diagnosis not present

## 2020-10-11 DIAGNOSIS — N250 Renal osteodystrophy: Secondary | ICD-10-CM | POA: Diagnosis not present

## 2020-10-11 DIAGNOSIS — N186 End stage renal disease: Secondary | ICD-10-CM | POA: Diagnosis not present

## 2020-10-11 LAB — CBC
HCT: 24.7 % — ABNORMAL LOW (ref 39.0–52.0)
Hemoglobin: 7.5 g/dL — ABNORMAL LOW (ref 13.0–17.0)
MCH: 29.6 pg (ref 26.0–34.0)
MCHC: 30.4 g/dL (ref 30.0–36.0)
MCV: 97.6 fL (ref 80.0–100.0)
Platelets: 242 10*3/uL (ref 150–400)
RBC: 2.53 MIL/uL — ABNORMAL LOW (ref 4.22–5.81)
RDW: 16.2 % — ABNORMAL HIGH (ref 11.5–15.5)
WBC: 8 10*3/uL (ref 4.0–10.5)
nRBC: 0 % (ref 0.0–0.2)

## 2020-10-11 LAB — RENAL FUNCTION PANEL
Albumin: 2.5 g/dL — ABNORMAL LOW (ref 3.5–5.0)
Anion gap: 12 (ref 5–15)
BUN: 63 mg/dL — ABNORMAL HIGH (ref 6–20)
CO2: 27 mmol/L (ref 22–32)
Calcium: 9.1 mg/dL (ref 8.9–10.3)
Chloride: 92 mmol/L — ABNORMAL LOW (ref 98–111)
Creatinine, Ser: 7.5 mg/dL — ABNORMAL HIGH (ref 0.61–1.24)
GFR, Estimated: 9 mL/min — ABNORMAL LOW (ref >60)
Glucose, Bld: 162 mg/dL — ABNORMAL HIGH (ref 70–99)
Phosphorus: 5.7 mg/dL — ABNORMAL HIGH (ref 2.5–4.6)
Potassium: 6.9 mmol/L (ref 3.5–5.1)
Sodium: 131 mmol/L — ABNORMAL LOW (ref 135–145)

## 2020-10-11 LAB — GLUCOSE, CAPILLARY
Glucose-Capillary: 106 mg/dL — ABNORMAL HIGH (ref 70–99)
Glucose-Capillary: 56 mg/dL — ABNORMAL LOW (ref 70–99)
Glucose-Capillary: 82 mg/dL (ref 70–99)
Glucose-Capillary: 130 mg/dL — ABNORMAL HIGH (ref 70–99)
Glucose-Capillary: 195 mg/dL — ABNORMAL HIGH (ref 70–99)

## 2020-10-11 MED ORDER — DARBEPOETIN ALFA 150 MCG/0.3ML IJ SOSY
PREFILLED_SYRINGE | INTRAMUSCULAR | Status: AC
  Filled 2020-10-11: qty 0.3

## 2020-10-11 MED ORDER — DEXTROSE 50 % IV SOLN: 12.5000 g | INTRAVENOUS | Status: AC

## 2020-10-11 MED ORDER — PROSOURCE PLUS PO LIQD: 30.0000 mL | Freq: Three times a day (TID) | ORAL | 0 refills | Status: AC

## 2020-10-11 MED ORDER — PENTAFLUOROPROP-TETRAFLUOROETH EX AERO: 1.0000 "application " | INHALATION_SPRAY | CUTANEOUS | Status: DC | PRN

## 2020-10-11 MED ORDER — ALTEPLASE 2 MG IJ SOLR: 2.0000 mg | Freq: Once | INTRAMUSCULAR | Status: DC | PRN

## 2020-10-11 MED ORDER — DEXTROSE 50 % IV SOLN
INTRAVENOUS | Status: AC
  Administered 2020-10-11: 12.5 g via INTRAVENOUS
  Filled 2020-10-11: qty 50

## 2020-10-11 MED ORDER — LIDOCAINE HCL (PF) 1 % IJ SOLN: 5.0000 mL | INTRAMUSCULAR | Status: DC | PRN

## 2020-10-11 MED ORDER — HEPARIN SODIUM (PORCINE) 1000 UNIT/ML IJ SOLN
INTRAMUSCULAR | Status: AC
  Filled 2020-10-11: qty 4

## 2020-10-11 MED ORDER — CALCITRIOL 0.5 MCG PO CAPS
ORAL_CAPSULE | ORAL | Status: AC
  Filled 2020-10-11: qty 5

## 2020-10-11 MED ORDER — SODIUM CHLORIDE 0.9 % IV SOLN: 100.0000 mL | INTRAVENOUS | Status: DC | PRN

## 2020-10-11 MED ORDER — SODIUM ZIRCONIUM CYCLOSILICATE 10 G PO PACK: 10.0000 g | PACK | Freq: Every day | ORAL | 0 refills | Status: DC

## 2020-10-11 MED ORDER — LIDOCAINE-PRILOCAINE 2.5-2.5 % EX CREA
1.0000 | TOPICAL_CREAM | CUTANEOUS | Status: DC | PRN
Start: 2020-10-11 — End: 2020-10-11

## 2020-10-11 MED ORDER — HEPARIN SODIUM (PORCINE) 1000 UNIT/ML DIALYSIS: 5000.0000 [IU] | INTRAMUSCULAR | Status: DC | PRN

## 2020-10-11 MED ORDER — HEPARIN SODIUM (PORCINE) 1000 UNIT/ML DIALYSIS
1000.0000 [IU] | INTRAMUSCULAR | Status: DC | PRN
Start: 2020-10-11 — End: 2020-10-11
  Administered 2020-10-11: 1000 [IU] via INTRAVENOUS_CENTRAL

## 2020-10-11 MED ORDER — OXYCODONE-ACETAMINOPHEN 5-325 MG PO TABS: 1.0000 | ORAL_TABLET | Freq: Four times a day (QID) | ORAL | 0 refills | Status: DC | PRN

## 2020-10-11 MED ORDER — HYDROMORPHONE HCL 1 MG/ML IJ SOLN
INTRAMUSCULAR | Status: AC
  Administered 2020-10-11: 0.5 mg via INTRAVENOUS
  Filled 2020-10-11: qty 0.5

## 2020-10-11 MED ORDER — IBUPROFEN 600 MG PO TABS: 600.0000 mg | ORAL_TABLET | Freq: Four times a day (QID) | ORAL | 0 refills | Status: DC | PRN

## 2020-10-11 NOTE — Progress Notes (Signed)
Subjective: S/p I&D left wrist abscess.  States left wrist feels good.   Objective: Vital signs in last 24 hours: Temp:  [97.7 F (36.5 C)-98.2 F (36.8 C)] 97.8 F (36.6 C) (01/14 1530) Pulse Rate:  [76-97] 76 (01/14 1530) Resp:  [14-18] 16 (01/14 1530) BP: (93-136)/(36-87) 93/69 (01/14 1530) SpO2:  [96 %-100 %] 99 % (01/14 1530)  Intake/Output from previous day: 01/13 0701 - 01/14 0700 In: 240 [P.O.:240] Out: -  Intake/Output this shift: Total I/O In: -  Out: 6000 [Other:6000]  Recent Labs    10/09/20 0304 10/11/20 1403  HGB 7.1* 7.5*   Recent Labs    10/09/20 0304 10/11/20 1403  WBC 7.6 8.0  RBC 2.52* 2.53*  HCT 25.3* 24.7*  PLT 162 242   Recent Labs    10/09/20 0304 10/09/20 1311  NA 131*  --   K 6.7* 4.9  CL 93*  --   CO2 29  --   BUN 47*  --   CREATININE 6.02*  --   GLUCOSE 227*  --   CALCIUM 9.0  --    No results for input(s): LABPT, INR in the last 72 hours.  Wound with good granulation tissue.  No erythema or drainage.  Skin edges with small amount of advancement.  Assessment/Plan: 25 Days Post-Op Procedure(s) (LRB): AMPUTATION BELOW KNEE (Left) S/p I&D left wrist.  Continue local wound care left wrist.  F/u ~1 week in office.   Leanora Cover 10/11/2020, 4:29 PM

## 2020-10-11 NOTE — Progress Notes (Signed)
Pt declined CHG bath at this time

## 2020-10-11 NOTE — Progress Notes (Signed)
Pt stated he would like for his blood sugar to be taken. Nurse Tech took [employer] and it was 65. Pt is unwilling to drink fluids at this time. He would like dextrose IV.

## 2020-10-11 NOTE — Progress Notes (Signed)
[name] Progress Note   Subjective:   Patient seen and examined at bedside in dialysis.  Angry he was not on first shift today.  Denies symptoms. Ready to go home.   Objective Vitals:   10/10/20 1656 10/10/20 1931 10/11/20 0327 10/11/20 0753  BP: 124/60 123/65 135/87 136/71  Pulse: 85 92 91 90  Resp: 14 16 14 16   Temp: 97.7 F (36.5 C) 98.2 F (36.8 C) 98 F (36.7 C) 97.9 F (36.6 C)  TempSrc: Oral Oral Oral Oral  SpO2: 97% 100% 100% 96%  Weight:      Height:       Physical Exam General:WDWN male in NAD Heart:RRR Lungs:nml WOB Abdomen:NTND Extremities:no LE edema, L BKA Dialysis Access: TDC in use   Filed Weights   10/07/20 1100 10/09/20 0900 10/09/20 1215  Weight: 80.3 kg 80.1 kg 74 kg   No intake or output data in the 24 hours ending 10/11/20 1243  Additional Objective Labs: Basic Metabolic Panel: Recent Labs  Lab 10/06/20 0908 10/07/20 0929 10/09/20 0304 10/09/20 1311  NA 130* 131* 131*  --   K >7.5* 6.4* 6.7* 4.9  CL 95* 96* 93*  --   CO2 20* 21* 29  --   GLUCOSE 289* 148* 227*  --   BUN 62* 48* 47*  --   CREATININE 7.17* 5.76* 6.02*  --   CALCIUM 9.2 8.9 9.0  --   PHOS 4.8* 4.1  --   --    Liver Function Tests: Recent Labs  Lab 10/06/20 0908 10/07/20 0929  ALBUMIN 2.3* 2.3*   No results for input(s): LIPASE, AMYLASE in the last 168 hours. CBC: Recent Labs  Lab 10/05/20 0159 10/07/20 1303 10/09/20 0304  WBC 9.5 8.4 7.6  HGB 7.1* 7.2* 7.1*  HCT 23.9* 24.3* 25.3*  MCV 98.8 99.2 100.4*  PLT 236 237 162   CBG: Recent Labs  Lab 10/11/20 0123 10/11/20 0334 10/11/20 0419 10/11/20 0647 10/11/20 1203  GLUCAP 106* 56* 82 130* 195*    Medications: . sodium chloride    . sodium chloride     . (feeding supplement) PROSource Plus  30 mL Oral TID BM  . calcitRIOL  2.5 mcg Oral Q M,W,F-HD  . Chlorhexidine Gluconate Cloth  6 each Topical Q0600  . Chlorhexidine Gluconate Cloth  6 each Topical Q0600  . Chlorhexidine  Gluconate Cloth  6 each Topical Q0600  . cinacalcet  60 mg Oral Q breakfast  . darbepoetin (ARANESP) injection - DIALYSIS  150 mcg Intravenous Q Fri-HD  . fentaNYL  1 patch Transdermal Q72H  . heparin  5,000 Units Subcutaneous Q8H  . insulin aspart  0-6 Units Subcutaneous TID WC  . multivitamin  1 tablet Oral QHS  . sevelamer carbonate  4,000 mg Oral TID WC  . sodium zirconium cyclosilicate  10 g Oral BID    Dialysis Orders: MWF @ [facility] 4h 30min 400/800 67kg TDC Hep 5000 + 5000 midrun - Calcitriol 86mcg PO q HD - No ESA ** Scheduled for R BC AVF on 09/24/20, canceled/delayed per patient request.  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 Issues with pain control/phantom leg pain- per primary team. 2. Hyperkalemia-Ongoing issuesince he was changed to regular diet.Refusing to change back to renal diet. Aware of dangers. Follow labs.LokelmaBID.K today pending. 3. ESRD:Usual MWF schedule-HD today per regular schedule.  4.HTN/volume:BP in goal.Volume overloaded, large gains. Counseled on fluid restrictions. UF as tolerated. 5. Anemia:S/p1U PRBCs on  12/27andAranesp dose increased to 150 q Friday. Last Hgb 7.1.Recheck iron studies with HD. FOBT negative. Transfuse prn 6. Secondary hyperparathyroidism:CorrCa/Phos good- continue binders/VDRA/sensipar. 7. Nutrition:Alb low - continue protein supplements. 8. Bipolar disorder + underlying personality disorder:Severe aggressionearlier this admit, continues to show aggression but not as severe. Psych consulted on 12/24 - did not meet criteria for inpatient psych. 9.L wrist abscess: S/p I&D 12/19 -now s/p antibiotic course. 10. T2DM: Chronically uncontrolled. 11: Itching/papules:Chronic per pt. Pruritis can be due to chronically high phosphorus/uremia. If persistent, outpatient dermatology consult may be  appropriate 12.Dispo:d/c today  Jen Mow, PA-C [name] 10/11/2020,12:43 PM  LOS: 30 days

## 2020-10-11 NOTE — Telephone Encounter (Signed)
Pt states that his left leg has been completely amputated, and he now needs PCP to go to the [facility] website and detail that he needs more time with nurse that comes to assist him daily.   [phone]

## 2020-10-11 NOTE — Progress Notes (Signed)
Pt resting in bed at this time. Alert and oriented x4. Potential DC today. Pt is refusing labs at this time. He stated he would like for them to be drawn during dialysis. Dilaudid given this shift for pain. Up to chair with walker and standby assist. He does not appear to be in any distress at this time. Will continue to monitor.

## 2020-10-11 NOTE — Plan of Care (Signed)

## 2020-10-11 NOTE — Telephone Encounter (Signed)
Patient is aware that no clinical staff is in the office. Will this be able to be done upon return on Monday if weather permits?

## 2020-10-11 NOTE — Telephone Encounter (Signed)
Per [facility] Patient is currently in the hospital and prior to discharge he is needing a medical change of status form. He wants to know if he could have the form faxed over to be completed today.   I advised [facility] that no one clinical is currently in office but that I would send this request to the nurse high priority. Please follow up ASAP.

## 2020-10-11 NOTE — Progress Notes (Signed)
PT Cancellation Note  Patient Details Name: [name] MRN: [id number] DOB: May 12, 1981   Cancelled Treatment:    Reason Eval/Treat Not Completed: Patient at procedure or test/unavailable Patient off unit to HD. PT will re-attempt as time allows. Patient to d/c today per RN.   [name] A. Gilford Rile PT, DPT Acute Rehabilitation Services Pager [phone] Office [phone]    [name] 10/11/2020, 1:00 PM

## 2020-10-11 NOTE — Discharge Summary (Signed)
Physician Discharge Summary  [name] QMV:[id number] DOB: Mar 05, 1981 DOA: 09/10/2020  PCP: Kerin Perna, NP  Admit date: 09/10/2020 Discharge date: 10/12/2020  Admitted From: home Disposition: LEFT AMA before being discharged.  Recommendations for Outpatient Follow-up:  Follow up with PCP in 1-2 weeks Please obtain BMP/CBC in one week Please follow up on the following pending results:  Home Health:yes  Equipment/Devices: yes  Discharge Condition: left AMA, WAS Stable Code Status:   Code Status: Prior Diet recommendation:  Diet Order             Diet Carb Modified                    Brief/Interim Summary: 40 year old male with complex comorbidities including ESRD on dialysis, bipolar disorder, narcotic seeking behavior, diabetes, hypertension admitted on 09/10/2020 with acute osteomyelitis of left ankle and foot along with sepsis.  Patient had complicated and prolonged hospitalization, he has had left BKA BY dR eARLY ON 09/16/20,and I&D of left wrist abscess. He has had difficulty placement due to uncontrolled pain/narcotic seeking behavior and need for skilled nursing facility neardialysis center. Patient had ongoing issues with uncontrolled hyperglycemia resulting into need for more insulin and hypoglycemia, has had issues with hyperkalemia and severe dietary noncompliance and would get very aggressive on changing his diet regimen or any pain regimen. He is off Lantus S/p HD 1/14. He is requesting to go home today. His potassium was high predialysis.  Plan is to repeat BMP after dialysis in the evening but patient refused, and he left AGAINST MEDICAL ADVICE.  He understood the risk of leaving which includes hyperkalemia causing cardiac arrest, he is aware about need for follow-up and they have already been set up with vascular surgery.  He will need to follow-up with orthopedics as outpatient.    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  Had Enterococcus and Proteus from the culture.  Patient had completed antibiotic course.  Is followed by podiatry.vascular has a small area of wound dehiscence, that is having granulation tissue. Staples intact.  Continue wound care, PT OT at home as per vascualar.  I did give him few days of prescription for Percocet AND can tale  ibuprofen (nephro okay with iburprofen).  TOC has arranged outpatient follow-up and equipment and home health.     ESRD on HD MWF last HD 1/14 Hyperkalemia ss/p temporizing measures and HD 1/12-potassium improved.  Continue Lokelma as per nephrology.  I did send a prescription for Lokelma.  Plan is to repeat potassium in the evening but patient refused and left AGAINST MEDICAL ADVICE   Recent Labs  Lab 10/06/20 0908 10/07/20 0929 10/09/20 0304 10/09/20 1311 10/11/20 1230  K >7.5* 6.4* 6.7* 4.9 6.9*   Secondary hyperparathyroidism continue binders/Sensipar   Left wrist abscess s/p post I&D on 12/19 with MSSA completed antibiotics.   Diabetes mellitus w/ uncontrolled hyperglycemia and brittle with episode of hypoglycemia: Off Lantus due to episodes of hypoglycemia.Hemoglobin A1c 7.3 on 12/29.  Blood sugar fluctuating but no more hypoglycemia remains on sliding scale insulin continue same.  Blood sugar remains poorly controlled due to dietary indiscretion, multiple attempts to counsel on diet has been unsuccessful.  Patient understands complications of hyperglycemia including delay in wound healing/risk of infection and further complication of BKA-he is alert awake oriented x3.  I am afraid if he continues to be noncompliant he will have  complications moving forward in the future.  Continue home insulin on d/c- but not lantus- he reprots he is well aware abt high and lo sugar and will adjsut his insulin at home. Declined my request to keep his inhouse to monitor sugar  overnight. He will f/u with pcp Recent Labs  Lab 10/11/20 0123 10/11/20 0334 10/11/20 0419 10/11/20 0647 10/11/20 1203  GLUCAP 106* 56* 82 130* 195*    Anemia of chronic disease received 1 unit PRBC with dialysis during this admission. H/h is 7.5 gm Recent Labs  Lab 10/07/20 1303 10/09/20 0304 10/11/20 1403  HGB 7.2* 7.1* 7.5*  HCT 24.3* 25.3* 24.7*    Possible bipolar disorder: At times patient gets easily agitated/aggressive specifically when talking about his pain regimen as well as diet restriction. He is alert awake oriented x3. He has had threatening behavior during hospitalization and required IVC, psychiatry was consulted, was not a candidate for inpatient psychiatric transfer.    HTN: BP stable    Poor compliance with diet/poor compliance with treatment: on 10/09/20- discussed readarding his uncontrolled hyperglycemia dietary noncompliance and pain regimen and also about his recovery after BKA about wound healing and overall quality of life moving forward: He is aware about his clinical condition and also was possible complications of hyperglycemia and side effects of opiates, he stated to me " I have been in the jail 4 times so I understand the consequences of my action and I have been living with diabetes since age of 8" " I have been eating like this for many years and I will keep doing the same" " I understand high blood sugar will cause delay in wound healing and also cause high risk of infection and further complication"   Of note patient left AGAINST MEDICAL ADVICE before official discharge in the evening after dialysis.  He refused the repeat BMP.  Transport was not  able to  be arranged in the evening, despite that patient called for a ride and went for home.                                                                                Patient at this time expresses desire to leave the Hospital immidiately, patient has been warned that this is not Medically advisable at  this time, and can result in Medical complications like Death and Disability, patient understands and accepts the risks involved and assumes full responsibilty of this decision. I made my best effort to convince him to stay including nursing staff   Nutrition Problem: Moderate Malnutrition Etiology: chronic illness (ESRD on HD) Signs/Symptoms: mild fat depletion,moderate fat depletion,moderate muscle depletion,severe muscle depletion Interventions: Prostat,MVI  Consults: vascular Ortho psych  Subjective: Aaox3, adamant on going home today Refused my offer to call his mother Refused bmp after HD   Discharge Exam: Vitals:   10/11/20 1500 10/11/20 1530  BP: (!) 101/56 93/69  Pulse: 97 76  Resp:  16  Temp:  97.8 F (36.6 C)  SpO2:  99%   General: Pt is alert, awake, not in acute distress Cardiovascular: RRR, S1/S2 +, no rubs, no gallops Respiratory: CTA bilaterally, no wheezing, no rhonchi Abdominal: Soft, NT, ND, bowel sounds +  Extremities: no edema, no cyanosis, bka  healing with small area of exposed skin- with granulation tissue  Discharge Instructions  Discharge Instructions     Diet Carb Modified   Complete by: As directed    Discharge instructions   Complete by: As directed    Please call call MD or return to ER for similar or worsening recurring problem that brought you to hospital or if any fever,nausea/vomiting,abdominal pain, uncontrolled pain, chest pain,  shortness of breath or any other alarming symptoms.  Please follow-up with your vascular doctor to monitor your amputated leg and for pain control.  Check blood sugar 3 times a day and at bedtime at home. If blood sugar running above 200 less than 70 please call your MD to adjust insulin. If blood sugars running less 100 do not use insulin and call MD. If you noticed signs and symptoms of hypoglycemia or low blood sugar like jitteriness, confusion, thirst, tremor, sweating- Check blood sugar, drink sugary  drink/biscuits/sweets to increase sugar level and call MD or return to ER.   Please follow-up your doctor as instructed in a week time and call the office for appointment.  Please avoid alcohol, smoking, or any other illicit substance and maintain healthy habits including taking your regular medications as prescribed.  You were cared for by a hospitalist during your hospital stay. If you have any questions about your discharge medications or the care you received while you were in the hospital after you are discharged, you can call the unit and ask to speak with the hospitalist on call if the hospitalist that took care of you is not available.  Once you are discharged, your primary care physician will handle any further medical issues. Please note that NO REFILLS for any discharge medications will be authorized once you are discharged, as it is imperative that you return to your primary care physician (or establish a relationship with a primary care physician if you do not have one) for your aftercare needs so that they can reassess your need for medications and monitor your lab values   Discharge wound care:   Complete by: As directed    Wet > dry L wrist dressing - change BID and prn soiling Follow-up with a vascular doctor for wound care instruction further   Increase activity slowly   Complete by: As directed       Allergies as of 10/11/2020   No Known Allergies      Medication List     STOP taking these medications    doxycycline 100 MG tablet Commonly known as: VIBRA-TABS   Lantus SoloStar 100 UNIT/ML Solostar Pen Generic drug: insulin glargine       TAKE these medications    (feeding supplement) PROSource Plus liquid Take 30 mLs by mouth 3 (three) times daily between meals for 30 doses.   albuterol 108 (90 Base) MCG/ACT inhaler Commonly known as: VENTOLIN HFA Inhale 2 puffs into the lungs every 6 (six) hours as needed for wheezing or shortness of breath.    calcitRIOL 0.5 MCG capsule Commonly known as: ROCALTROL Take 5 capsules (2.5 mcg total) by mouth every Monday, Wednesday, and Friday with hemodialysis.   cinacalcet 60 MG tablet Commonly known as: SENSIPAR SMARTSIG:2 Tablet(s) By Mouth Every Evening   ibuprofen 600 MG tablet Commonly known as: ADVIL Take 1 tablet (600 mg total) by mouth every 6 (six) hours as needed for up to 30 doses for moderate pain.   MIRCERA IJ Dialysis med  NovoLOG 100 UNIT/ML injection Generic drug: insulin aspart INJECT 6 UNITS SUBCUTANEOUSLY TWICE DAILY WITH A MEAL   OneTouch Delica Plus [id number] Misc Apply topically.   OneTouch Verio Flex System w/Device Kit 1 Bag by Does not apply route 3 (three) times daily before meals.   OneTouch Verio test strip Generic drug: glucose blood Check blood sugars three times daily   oxyCODONE-acetaminophen 5-325 MG tablet Commonly known as: Percocet Take 1 tablet by mouth every 6 (six) hours as needed for up to 12 doses for severe pain.   Regranex 0.01 % gel Generic drug: becaplermin Apply 1 application topically daily.   sevelamer carbonate 800 MG tablet Commonly known as: RENVELA Take 2,400-4,000 mg by mouth See admin instructions. Take 5 tablets with meals then take 3 tablets with snacks   sodium zirconium cyclosilicate 10 g Pack packet Commonly known as: LOKELMA Take 10 g by mouth daily for 7 days.               Discharge Care Instructions  (From admission, onward)           Start     Ordered   10/11/20 0000  Discharge wound care:       Comments: Wet > dry L wrist dressing - change BID and prn soiling Follow-up with a vascular doctor for wound care instruction further   10/11/20 1630            Follow-up Information     [facility] Follow up.   Specialty: [place] Why: the office will call to schedule home health visits Contact information: [place] [address] [phone]          Kerin Perna, NP Follow up in 1 week(s).   Specialty: Internal Medicine Contact information: [address] [place] [address] [phone]         Rosetta Posner, MD. Call.   Specialties: Vascular Surgery, Cardiology Why: call office, for follow up w/i a week Contact information: [place] [address] [phone]         Leanora Cover, MD In 1 week.   Specialty: Orthopedic Surgery Contact information: [place] [address] [phone]                No Known Allergies  The results of significant diagnostics from this hospitalization (including imaging, microbiology, ancillary and laboratory) are listed below for reference.    Microbiology: No results found for this or any previous visit (from the past 240 hour(s)).  Procedures/Studies: DG Wrist Complete Left  Result Date: 09/13/2020 CLINICAL DATA:  Possible osteomyelitis of left wrist. EXAM: LEFT WRIST - COMPLETE 3+ VIEW COMPARISON:  None. FINDINGS: There is no evidence of fracture or dislocation. There is no evidence of arthropathy or other focal bone abnormality. Vascular calcifications are noted. No definite lytic destruction is seen to suggest osteomyelitis. IMPRESSION: No definite abnormality seen in the left wrist. No definite evidence of osteomyelitis. Electronically Signed   By: Marijo Conception M.D.   On: 09/13/2020 15:23   MR WRIST LEFT WO CONTRAST  Result Date: 09/14/2020 CLINICAL DATA:  Skin ulceration on the volar aspect the left wrist. EXAM: MR OF THE LEFT WRIST WITHOUT CONTRAST TECHNIQUE: Multiplanar, multisequence MR imaging of the left wrist was performed. No intravenous contrast was administered. COMPARISON:  Plain films left wrist 09/13/2020. FINDINGS: Bones/Joint/Cartilage There is no marrow edema to suggest osteomyelitis. No fracture, stress change or worrisome lesion.  No joint effusion. No evidence of arthropathy. Ligaments Intact. Muscles and  Tendons Mild, ill-defined edema and mild atrophy are seen in the musculature of the hypothenar eminence. Musculature is otherwise negative. No evidence of tenosynovitis. Soft tissues Skin wound is seen on the volar aspect of the wrist. Deep to the wound, there is fluid measuring approximately 0.7 cm AP by 3.2 cm craniocaudal by 1.6 cm transverse. A small amount of fluid in the pisotriquetral recess is incidentally noted. IMPRESSION: Skin ulceration on the volar aspect of the wrist with underlying somewhat ill-defined fluid compatible with phlegmon or early abscess. Negative for osteomyelitis or septic joint. Mild edema in the musculature of the hypothenar eminence could be due to myositis or early atrophy. Electronically Signed   By: Inge Rise M.D.   On: 09/14/2020 10:27     Labs: BNP (last 3 results) No results for input(s): BNP in the last 8760 hours. Basic Metabolic Panel: Recent Labs  Lab 10/06/20 0908 10/07/20 0929 10/09/20 0304 10/09/20 1311 10/11/20 1230  NA 130* 131* 131*  --  131*  K >7.5* 6.4* 6.7* 4.9 6.9*  CL 95* 96* 93*  --  92*  CO2 20* 21* 29  --  27  GLUCOSE 289* 148* 227*  --  162*  BUN 62* 48* 47*  --  63*  CREATININE 7.17* 5.76* 6.02*  --  7.50*  CALCIUM 9.2 8.9 9.0  --  9.1  PHOS 4.8* 4.1  --   --  5.7*   Liver Function Tests: Recent Labs  Lab 10/06/20 0908 10/07/20 0929 10/11/20 1230  ALBUMIN 2.3* 2.3* 2.5*   No results for input(s): LIPASE, AMYLASE in the last 168 hours. No results for input(s): AMMONIA in the last 168 hours. CBC: Recent Labs  Lab 10/07/20 1303 10/09/20 0304 10/11/20 1403  WBC 8.4 7.6 8.0  HGB 7.2* 7.1* 7.5*  HCT 24.3* 25.3* 24.7*  MCV 99.2 100.4* 97.6  PLT 237 162 242   Cardiac Enzymes: No results for input(s): CKTOTAL, CKMB, CKMBINDEX, TROPONINI in the last 168 hours. BNP: Invalid input(s): POCBNP CBG: Recent Labs  Lab 10/11/20 0123 10/11/20 0334 10/11/20 0419 10/11/20 0647 10/11/20 1203  GLUCAP 106* 56* 82 130*  195*   D-Dimer No results for input(s): DDIMER in the last 72 hours. Hgb A1c No results for input(s): HGBA1C in the last 72 hours. Lipid Profile No results for input(s): CHOL, HDL, LDLCALC, TRIG, CHOLHDL, LDLDIRECT in the last 72 hours. Thyroid function studies No results for input(s): TSH, T4TOTAL, T3FREE, THYROIDAB in the last 72 hours.  Invalid input(s): FREET3 Anemia work up No results for input(s): VITAMINB12, FOLATE, FERRITIN, TIBC, IRON, RETICCTPCT in the last 72 hours. Urinalysis    Component Value Date/Time   COLORURINE YELLOW 05/26/2015 1115   APPEARANCEUR CLEAR 05/26/2015 1115   APPEARANCEUR Hazy 07/30/2014 2303   LABSPEC 1.016 05/26/2015 1115   LABSPEC 1.012 07/30/2014 2303   PHURINE 8.0 05/26/2015 1115   GLUCOSEU 500 (A) 05/26/2015 1115   GLUCOSEU >=500 07/30/2014 2303   HGBUR MODERATE (A) 05/26/2015 1115   BILIRUBINUR NEGATIVE 05/26/2015 1115   BILIRUBINUR Negative 07/30/2014 2303   KETONESUR 15 (A) 05/26/2015 1115   PROTEINUR >300 (A) 05/26/2015 1115   UROBILINOGEN 0.2 05/26/2015 1115   NITRITE NEGATIVE 05/26/2015 1115   LEUKOCYTESUR NEGATIVE 05/26/2015 1115   LEUKOCYTESUR Negative 07/30/2014 2303   Sepsis Labs Invalid input(s): PROCALCITONIN,  WBC,  LACTICIDVEN Microbiology No results found for this or any previous visit (from the past 240 hour(s)).  Time coordinating discharge: 0 minutes  SIGNED: Antonieta Pert, MD  [facility] 10/12/2020, 1:33 PM  If 7PM-7AM, please contact night-coverage [email or web address]

## 2020-10-11 NOTE — Progress Notes (Signed)
Pt returned from dialysis and was eating ready to go home when lab called and his potassium before dialysis came back as 6.9. MD made aware and RN called on call nephrologist. Orders placed for repeat labs and pt informed that potassium was high and needed to recheck before pt could discharge home. Pt became upset and said he was leaving any ways and did not want his labs rechecked. Pt called his mom and told her to pick him up. RN informed pt of what could happen if his potassium remained high and was not treated. Pt stated "I know, I have been through it before." Pt encouraged to follow renal diet at home and take medications as prescribed. Pt belongings gathered. AMA paper signed. Pt's mother picked pt up and took him home. MD made aware of pt leaving AMA.

## 2020-10-11 NOTE — Progress Notes (Signed)
Pt BS is now 35 and he states that he is feeling better

## 2020-10-12 DIAGNOSIS — I10 Essential (primary) hypertension: Secondary | ICD-10-CM | POA: Diagnosis not present

## 2020-10-13 ENCOUNTER — Encounter (HOSPITAL_COMMUNITY): Payer: Self-pay | Admitting: Emergency Medicine

## 2020-10-13 ENCOUNTER — Inpatient Hospital Stay (HOSPITAL_COMMUNITY)
Admission: EM | Admit: 2020-10-13 | Discharge: 2020-10-22 | DRG: 474 | Disposition: A | Discharge status: Home / Self Care | Payer: Medicare HMO | Attending: Internal Medicine | Admitting: Internal Medicine

## 2020-10-13 ENCOUNTER — Other Ambulatory Visit: Payer: Self-pay

## 2020-10-13 DIAGNOSIS — I120 Hypertensive chronic kidney disease with stage 5 chronic kidney disease or end stage renal disease: Secondary | ICD-10-CM | POA: Diagnosis present

## 2020-10-13 DIAGNOSIS — W19XXXA Unspecified fall, initial encounter: Secondary | ICD-10-CM | POA: Diagnosis not present

## 2020-10-13 DIAGNOSIS — Z9119 Patient's noncompliance with other medical treatment and regimen: Secondary | ICD-10-CM

## 2020-10-13 DIAGNOSIS — F609 Personality disorder, unspecified: Secondary | ICD-10-CM | POA: Diagnosis present

## 2020-10-13 DIAGNOSIS — M79605 Pain in left leg: Secondary | ICD-10-CM | POA: Diagnosis present

## 2020-10-13 DIAGNOSIS — Z20822 Contact with and (suspected) exposure to covid-19: Secondary | ICD-10-CM | POA: Diagnosis present

## 2020-10-13 DIAGNOSIS — E875 Hyperkalemia: Secondary | ICD-10-CM | POA: Diagnosis present

## 2020-10-13 DIAGNOSIS — D631 Anemia in chronic kidney disease: Secondary | ICD-10-CM | POA: Diagnosis present

## 2020-10-13 DIAGNOSIS — Y835 Amputation of limb(s) as the cause of abnormal reaction of the patient, or of later complication, without mention of misadventure at the time of the procedure: Secondary | ICD-10-CM | POA: Diagnosis present

## 2020-10-13 DIAGNOSIS — R296 Repeated falls: Secondary | ICD-10-CM | POA: Diagnosis present

## 2020-10-13 DIAGNOSIS — E871 Hypo-osmolality and hyponatremia: Secondary | ICD-10-CM | POA: Diagnosis not present

## 2020-10-13 DIAGNOSIS — N2581 Secondary hyperparathyroidism of renal origin: Secondary | ICD-10-CM | POA: Diagnosis present

## 2020-10-13 DIAGNOSIS — T8130XA Disruption of wound, unspecified, initial encounter: Secondary | ICD-10-CM | POA: Diagnosis not present

## 2020-10-13 DIAGNOSIS — T8781 Dehiscence of amputation stump: Principal | ICD-10-CM | POA: Diagnosis present

## 2020-10-13 DIAGNOSIS — E1143 Type 2 diabetes mellitus with diabetic autonomic (poly)neuropathy: Secondary | ICD-10-CM | POA: Diagnosis present

## 2020-10-13 DIAGNOSIS — Z833 Family history of diabetes mellitus: Secondary | ICD-10-CM

## 2020-10-13 DIAGNOSIS — R69 Illness, unspecified: Secondary | ICD-10-CM

## 2020-10-13 DIAGNOSIS — L089 Local infection of the skin and subcutaneous tissue, unspecified: Secondary | ICD-10-CM | POA: Diagnosis present

## 2020-10-13 DIAGNOSIS — R5381 Other malaise: Secondary | ICD-10-CM | POA: Diagnosis not present

## 2020-10-13 DIAGNOSIS — Z89421 Acquired absence of other right toe(s): Secondary | ICD-10-CM

## 2020-10-13 DIAGNOSIS — I10 Essential (primary) hypertension: Secondary | ICD-10-CM | POA: Diagnosis not present

## 2020-10-13 DIAGNOSIS — D62 Acute posthemorrhagic anemia: Secondary | ICD-10-CM | POA: Diagnosis not present

## 2020-10-13 DIAGNOSIS — Z992 Dependence on renal dialysis: Secondary | ICD-10-CM

## 2020-10-13 DIAGNOSIS — E1165 Type 2 diabetes mellitus with hyperglycemia: Secondary | ICD-10-CM | POA: Diagnosis present

## 2020-10-13 DIAGNOSIS — E1122 Type 2 diabetes mellitus with diabetic chronic kidney disease: Secondary | ICD-10-CM | POA: Diagnosis present

## 2020-10-13 DIAGNOSIS — E11628 Type 2 diabetes mellitus with other skin complications: Secondary | ICD-10-CM | POA: Diagnosis present

## 2020-10-13 DIAGNOSIS — Z79899 Other long term (current) drug therapy: Secondary | ICD-10-CM

## 2020-10-13 DIAGNOSIS — L89892 Pressure ulcer of other site, stage 2: Secondary | ICD-10-CM | POA: Diagnosis present

## 2020-10-13 DIAGNOSIS — Z89412 Acquired absence of left great toe: Secondary | ICD-10-CM

## 2020-10-13 DIAGNOSIS — F319 Bipolar disorder, unspecified: Secondary | ICD-10-CM | POA: Diagnosis present

## 2020-10-13 DIAGNOSIS — Z9111 Patient's noncompliance with dietary regimen: Secondary | ICD-10-CM

## 2020-10-13 DIAGNOSIS — Z89512 Acquired absence of left leg below knee: Secondary | ICD-10-CM

## 2020-10-13 DIAGNOSIS — Z794 Long term (current) use of insulin: Secondary | ICD-10-CM

## 2020-10-13 DIAGNOSIS — Z841 Family history of disorders of kidney and ureter: Secondary | ICD-10-CM

## 2020-10-13 DIAGNOSIS — L8990 Pressure ulcer of unspecified site, unspecified stage: Secondary | ICD-10-CM | POA: Insufficient documentation

## 2020-10-13 DIAGNOSIS — Z8616 Personal history of COVID-19: Secondary | ICD-10-CM

## 2020-10-13 DIAGNOSIS — N186 End stage renal disease: Secondary | ICD-10-CM | POA: Diagnosis present

## 2020-10-13 MED ORDER — HYDROMORPHONE HCL 1 MG/ML IJ SOLN
1.0000 mg | Freq: Once | INTRAMUSCULAR | Status: AC
  Administered 2020-10-13: 1 mg via INTRAMUSCULAR
  Filled 2020-10-13: qty 1

## 2020-10-13 MED ORDER — OXYCODONE-ACETAMINOPHEN 5-325 MG PO TABS: 1.0000 | ORAL_TABLET | Freq: Once | ORAL | Status: DC

## 2020-10-13 NOTE — ED Notes (Signed)
Unable to obtain labs 

## 2020-10-13 NOTE — ED Notes (Signed)
[name], mother, [phone] would like an update when available

## 2020-10-13 NOTE — ED Provider Notes (Signed)
[facility] EMERGENCY DEPARTMENT Provider Note   CSN: [id number] Arrival date & time: 10/13/20  1739     History Chief Complaint  Patient presents with  . Wound Check    [name] is a 40 y.o. male.  HPI Patient reports that he had a second fall onto his more recent amputation wound.  That happened today.  He reports that the wound opened up completely and was bleeding quite a bit.  Reports he continues to have a lot of pain in the leg.  It has been an ongoing problem.  He reports when he was in the hospital he was given Dilaudid for pain which was effective but, once he got discharged, his oral medications were no longer working.  He also points out that he still has a fentanyl patch on, but reports that was from when he was in the hospital and he does not have anymore.  He also reports he has missed his dialysis session and will need dialysis tomorrow.  No shortness of breath or chest pain.  No fever or vomiting.  Patient is careful to point out that he needs a full, regular diet.  He reports that it should be well established that he does not have a restricted diet when he is in the hospital, otherwise he will just order his own food.    Past Medical History:  Diagnosis Date  . Anemia   . Diabetes mellitus without complication ([place])   . Diabetic gastroparesis ([place])   . Dialysis patient ([place])   . Hypertension   . Renal disorder    Dialysis  . Sepsis [facility])     Patient Active Problem List   Diagnosis Date Noted  . Adjustment disorder, unspecified 09/21/2020  . Malnutrition of moderate degree 09/13/2020  . Osteomyelitis of left foot ([place]) 09/11/2020  . Volume overload 07/22/2020  . Hypocalcemia 06/07/2020  . Diabetic ulcer of heel ([place]) 05/31/2020  . Type 2 diabetes mellitus with foot ulcer ([place]) 04/23/2020  . COVID-19 02/20/2020  . Personal history of COVID-19 02/20/2020  . Diabetic ulcer of left midfoot associated with type 1 diabetes mellitus, with  fat layer exposed ([place])   . Charcot's joint of foot, left   . Pneumonia due to COVID-19 virus 02/14/2020  . Cellulitis of left foot 01/31/2020  . Allergy, unspecified, initial encounter 07/17/2019  . Diarrhea 04/24/2019  . Bone erosion determined by x-ray   . Encephalopathy acute   . Diabetic ulcer of left midfoot associated with diabetes mellitus due to underlying condition, with necrosis of bone ([place])   . Open wound of left foot   . Encounter for orthopedic aftercare following surgical amputation   . Foot infection   . Gangrene of toe of left foot ([place])   . Cellulitis 10/26/2018  . Diabetic ulcer of left midfoot associated with diabetes mellitus due to underlying condition, with bone involvement without evidence of necrosis ([place])   . Nausea vomiting and diarrhea 10/20/2018  . DM (diabetes mellitus), secondary, uncontrolled, with neurologic complications ([place])   . Headache 09/23/2018  . Acute osteomyelitis involving ankle and foot, left ([place])   . Anemia of chronic disease 08/18/2018  . Encounter for immunization 06/29/2018  . Hyperkalemia   . Unspecified protein-calorie malnutrition ([place]) 02/14/2018  . Encounter for removal of sutures 02/07/2018  . Fluid overload, unspecified 01/22/2018  . Osteomyelitis of ankle or foot, acute, right ([place]) 10/30/2017  . Cellulitis and abscess of toe of left foot   .  Diabetic foot infection ([place])   . Insulin dependent type 1 diabetes mellitus ([place]) 10/25/2017  . Diabetic gastroparesis ([place]) 10/25/2017  . Sepsis ([place]) 10/25/2017  . Fever, unspecified 10/28/2016  . Iron deficiency anemia, unspecified 10/28/2016  . Other specified coagulation defects ([place]) 10/28/2016  . Pruritus, unspecified 10/28/2016  . Secondary hyperparathyroidism of renal origin ([place]) 10/28/2016  . Shortness of breath 10/28/2016  . HTN (hypertension) 08/09/2014  . ESRD on dialysis ([place]) 03/19/2014  . Metabolic acidosis 21/19/4174    Past Surgical History:  Procedure  Laterality Date  . AMPUTATION Right 02/02/2018   Procedure: RIGHT FIFTH TOE AND METATARSAL AMPUTATION. Filetted toe flap metatarsal resection. Debridement Plantar Foot wound;  Surgeon: Evelina Bucy, DPM;  Location: [facility];  Service: Podiatry;  Laterality: Right;  . AMPUTATION Left 08/20/2018   Procedure: FIFTH METATARSAL BONE BIOPSY;  Surgeon: Evelina Bucy, DPM;  Location: [facility];  Service: Podiatry;  Laterality: Left;  . AMPUTATION Left 10/28/2018   Procedure: LEFT GREAT TOE AMPUTATION;  Surgeon: Evelina Bucy, DPM;  Location: [facility];  Service: Podiatry;  Laterality: Left;  . AMPUTATION Left 09/16/2020   Procedure: AMPUTATION BELOW KNEE;  Surgeon: Rosetta Posner, MD;  Location: [facility];  Service: Vascular;  Laterality: Left;  . APPLICATION OF WOUND VAC  02/02/2018   Procedure: APPLICATION OF WOUND VAC  Right Foot;  Surgeon: Evelina Bucy, DPM;  Location: [facility];  Service: Podiatry;;  . APPLICATION OF WOUND VAC Left 10/28/2018   Procedure: APPLICATION OF WOUND VAC LEFT TOE;  Surgeon: Evelina Bucy, DPM;  Location: [facility];  Service: Podiatry;  Laterality: Left;  . APPLICATION OF WOUND VAC Left 11/01/2018   Procedure: APPLICATION OF WOUND VAC;  Surgeon: Evelina Bucy, DPM;  Location: [facility];  Service: Podiatry;  Laterality: Left;  . AV FISTULA PLACEMENT     left arm.  . AV FISTULA PLACEMENT Right 12/22/2016   Procedure: INSERTION OF ARTERIOVENOUS (AV) GORE-TEX GRAFT ARM;  Surgeon: Elam Dutch, MD;  Location: [facility];  Service: Vascular;  Laterality: Right;  . AV FISTULA PLACEMENT Left 05/26/2018   Procedure: INSERTION OF  ARTERIOVENOUS (AV) GORE-TEX GRAFT LEFT ARM;  Surgeon: Serafina Mitchell, MD;  Location: [place];  Service: Vascular;  Laterality: Left;  . EYE SURGERY    . I & D EXTREMITY Right 10/31/2017   Procedure: IRRIGATION AND DEBRIDEMENT RIGHT FOOT;  Surgeon: Evelina Bucy, DPM;  Location: [facility];  Service: Podiatry;  Laterality: Right;  . I & D EXTREMITY Left 08/20/2018   Procedure:  IRRIGATION AND DEBRIDEMENT EXTREMITY WITH SECONDARY WOUND CLOSUREAND APPLICATION OF WOUND VAC LEFT FOOT;  Surgeon: Evelina Bucy, DPM;  Location: [facility];  Service: Podiatry;  Laterality: Left;  . I & D EXTREMITY Left 10/20/2018   Procedure: IRRIGATION AND DEBRIDEMENT LEFT FOOT  DEBRIDEMENT LATERAL FOOT WOUND;  Surgeon: Evelina Bucy, DPM;  Location: [facility];  Service: Podiatry;  Laterality: Left;  . I & D EXTREMITY Left 10/28/2018   Procedure: IRRIGATION AND DEBRIDEMENT LEFT TOE;  Surgeon: Evelina Bucy, DPM;  Location: [facility];  Service: Podiatry;  Laterality: Left;  . I & D EXTREMITY Left 09/14/2020   Procedure: IRRIGATION AND DEBRIDEMENT WRIST;  Surgeon: Leanora Cover, MD;  Location: [facility];  Service: Orthopedics;  Laterality: Left;  . INSERTION OF DIALYSIS CATHETER     Right subclavian  . IR AV DIALY SHUNT INTRO NEEDLE/INTRACATH INITIAL W/PTA/IMG RIGHT Right 02/05/2018  . IR FLUORO GUIDE CV  LINE RIGHT  01/31/2020  . IR THROMBECTOMY AV FISTULA W/THROMBOLYSIS/PTA INC/SHUNT/IMG LEFT Left 08/24/2018  . IR THROMBECTOMY AV FISTULA W/THROMBOLYSIS/PTA INC/SHUNT/IMG LEFT Left 01/06/2019  . IR US GUIDE VASC ACCESS LEFT  08/24/2018  . IR US GUIDE VASC ACCESS RIGHT  02/05/2018  . IR US GUIDE VASC ACCESS RIGHT  01/31/2020  . IRRIGATION AND DEBRIDEMENT FOOT Right 10/23/2018   Procedure: Irrigation and Debridement to tendon, Left Foot;  Surgeon: Evelina Bucy, DPM;  Location: [facility];  Service: Podiatry;  Laterality: Right;  . IRRIGATION AND DEBRIDEMENT FOOT Left 11/01/2018   Procedure: IRRIGATION AND DEBRIDEMENT PARTIAL WOUND CLOSURE LOCAL TISSUE TRANSFER AND FLAP ROTATION, LEFT FOOT;  Surgeon: Evelina Bucy, DPM;  Location: [facility];  Service: Podiatry;  Laterality: Left;  . TRANSMETATARSAL AMPUTATION N/A 08/18/2018   Procedure: IRRIGATION AND DEBRIDEMENT OF LEFT 5TH TOE AND TRANSMETATARSAL, WITH PARTICAL LEFT 5TH TOE AND METATARSAL AMPUTATION, BONE BIOPSY, WOUND VAC APPLICATION.;  Surgeon: Evelina Bucy, DPM;   Location: [facility];  Service: Podiatry;  Laterality: N/A;  . UPPER EXTREMITY VENOGRAPHY N/A 11/16/2016   Procedure: Upper Extremity Venography - Right Central;  Surgeon: Elam Dutch, MD;  Location: [facility];  Service: Cardiovascular;  Laterality: N/A;  . UPPER EXTREMITY VENOGRAPHY N/A 05/25/2018   Procedure: UPPER EXTREMITY VENOGRAPHY - Bilateral;  Surgeon: Marty Heck, MD;  Location: [facility];  Service: Cardiovascular;  Laterality: N/A;       Family History  Problem Relation Age of Onset  . Diabetes Mellitus II Other   . Diabetes Father   . Renal Disease Father        ESRD    Social History   Tobacco Use  . Smoking status: Never Smoker  . Smokeless tobacco: Never Used  Vaping Use  . Vaping Use: Never used  Substance Use Topics  . Alcohol use: No  . Drug use: No    Home Medications Prior to Admission medications   Medication Sig Start Date End Date Taking? Authorizing Provider  albuterol (VENTOLIN HFA) 108 (90 Base) MCG/ACT inhaler Inhale 2 puffs into the lungs every 6 (six) hours as needed for wheezing or shortness of breath. 02/18/20   Thurnell Lose, MD  becaplermin (REGRANEX) 0.01 % gel Apply 1 application topically daily. Patient not taking: No sig reported 08/31/20   Evelina Bucy, DPM  Blood Glucose Monitoring Suppl (ONETOUCH VERIO FLEX SYSTEM) w/Device KIT 1 Bag by Does not apply route 3 (three) times daily before meals. 05/02/20   Kerin Perna, NP  calcitRIOL (ROCALTROL) 0.5 MCG capsule Take 5 capsules (2.5 mcg total) by mouth every Monday, Wednesday, and Friday with hemodialysis. 10/26/18   Modena Jansky, MD  cinacalcet (SENSIPAR) 60 MG tablet SMARTSIG:2 Tablet(s) By Mouth Every Evening 04/24/20   [provider]  glucose blood (ONETOUCH VERIO) test strip Check blood sugars three times daily 07/01/20   Kerin Perna, NP  ibuprofen (ADVIL) 600 MG tablet Take 1 tablet (600 mg total) by mouth every 6 (six) hours as  needed for up to 30 doses for moderate pain. 10/11/20   Antonieta Pert, MD  Lancets (ONETOUCH DELICA PLUS [id number]) Geiger Apply topically. 05/02/20   [provider]  Methoxy PEG-Epoetin Beta (MIRCERA IJ) Dialysis med 04/19/20 04/18/21  [provider]  NOVOLOG 100 UNIT/ML injection INJECT 6 UNITS SUBCUTANEOUSLY TWICE DAILY WITH A MEAL 07/19/20   Kerin Perna, NP  Nutritional Supplements (,FEEDING SUPPLEMENT, PROSOURCE PLUS) liquid Take 30 mLs  by mouth 3 (three) times daily between meals for 30 doses. 10/11/20 10/21/20  Antonieta Pert, MD  oxyCODONE-acetaminophen (PERCOCET) 5-325 MG tablet Take 1 tablet by mouth every 6 (six) hours as needed for up to 12 doses for severe pain. 10/11/20   Antonieta Pert, MD  sevelamer carbonate (RENVELA) 800 MG tablet Take 2,400-4,000 mg by mouth See admin instructions. Take 5 tablets with meals then take 3 tablets with snacks 04/27/19   [provider]  sodium zirconium cyclosilicate (LOKELMA) 10 g PACK packet Take 10 g by mouth daily for 7 days. 10/11/20 10/18/20  Antonieta Pert, MD    Allergies    Patient has no known allergies.  Review of Systems   Review of Systems 10 systems reviewed and negative except as per HPI Physical Exam Updated Vital Signs BP 114/82 (BP Location: Left Arm)   Pulse 84   Temp 98.4 F (36.9 C) (Oral)   Resp 17   SpO2 97%   Physical Exam Constitutional:      Comments: Alert and nontoxic.  Patient's mental status is clear.  No respiratory distress.  Patient is quite animated in his speech, mildly pressured but oriented.  HENT:     Head: Normocephalic and atraumatic.     Mouth/Throat:     Pharynx: Oropharynx is clear.  Eyes:     Extraocular Movements: Extraocular movements intact.  Cardiovascular:     Rate and Rhythm: Normal rate and regular rhythm.  Pulmonary:     Effort: Pulmonary effort is normal.     Breath sounds: Normal breath sounds.  Abdominal:     General: There is no distension.     Palpations: Abdomen  is soft.     Tenderness: There is no abdominal tenderness. There is no guarding.  Musculoskeletal:     Comments: Patient has a very bloody stump.  No active bleeding.  No swelling around wound margins.  Wound is grossly dehisced.  See attached images.  Skin:    General: Skin is warm and dry.           ED Results / Procedures / Treatments   Labs (all labs ordered are listed, but only abnormal results are displayed) Labs Reviewed  RESP PANEL BY RT-PCR (FLU A&B, COVID) [id number]  BASIC METABOLIC PANEL  CBC WITH DIFFERENTIAL/PLATELET    EKG None  Radiology No results found.  Procedures Procedures (including critical care time)  Medications Ordered in ED Medications  HYDROmorphone (DILAUDID) injection 1 mg (1 mg Intramuscular Given 10/13/20 2125)    ED Course  I have reviewed the triage vital signs and the nursing notes.  Pertinent labs & imaging results that were available during my care of the patient were reviewed by me and considered in my medical decision making (see chart for details).    MDM Rules/Calculators/A&P                          Consult: Vascular surgery Dr. Donzetta Matters.  He advises patient will need revision and AKA surgery.  Recommendations for admission to hospitalist service.  Patient will be seen in consult for definitive management of wound.  Patient presents as outlined, describing a fall that caused him to land directly on his amputation site with a large dehiscence.  Wound is completely open but there is coagulated blood without any active bleeding.  Medically, patient is otherwise well in appearance this evening.  He is alert and nontoxic.  He makes multiple requests for pain  medications and describes the medications that exactly work for him, requesting dilaudid.  He was given a dose of Dilaudid IM.  At the time of his triage, there is no available rooms in the emergency department to start treatment with IV placement.  Patient is not appropriate for IV  placement and being in the triage area unsupervised.   ED clerk advises that the patient's brother called the hospital irate and threatening the facility and staff because his brother was not getting adequate pain control.  Police were notified to further investigate outside threats of harm to the facility and staff.  Unfortunately, due to complete full boarding status of the emergency department and hospital, patient cannot be moved directly to a room.  At the time of my evaluation he is clinically stable with stable vital signs, no fever, no respiratory distress.  Lab work ordered and IM medications for pain ordered.  Plan is ultimately for admission however at this immediate time is no indication to triage ahead of other admitted patients for bed assignment. Wound is large but stable. Vascular surgery will do nonemergent AKA once immediate medical conditions managed by admitting team.  At shift change, patient remains in triage. Case signed out to Dr. Sedonia Small for follow up labs and admission to hospitalist Final Clinical Impression(s) / ED Diagnoses Final diagnoses:  Wound dehiscence  Severe comorbid illness    Rx / DC Orders ED Discharge Orders    None       Charlesetta Shanks, MD 10/13/20 2320

## 2020-10-13 NOTE — ED Triage Notes (Signed)
Pt to triage via [facility] from home.  Pt fell and tore his stiches in [facility] today.  C/o pain and small amount of bleeding.  Bandage in place by EMS.  Pt requesting Dilaudid on arrival.  States he is taking oxycodone at home.

## 2020-10-13 NOTE — Consult Note (Signed)
ED Consult    Reason for Consult:  bka dehiscence Referring Physician:  Dr. Johnney Killian MRN #:  [id number]  History of Present Illness: This is a 40 y.o. male with history of left below-knee amputation.  At the time the lateral aspect of the amputation was allowed to open.  He now returns after a fall in the amputation site.  History difficult to obtain from the patient as he is obsessing over not having a strict diet and having his usual pain regimen.  Past Medical History:  Diagnosis Date  . Anemia   . Diabetes mellitus without complication ([place])   . Diabetic gastroparesis ([place])   . Dialysis patient ([place])   . Hypertension   . Renal disorder    Dialysis  . Sepsis [facility])     Past Surgical History:  Procedure Laterality Date  . AMPUTATION Right 02/02/2018   Procedure: RIGHT FIFTH TOE AND METATARSAL AMPUTATION. Filetted toe flap metatarsal resection. Debridement Plantar Foot wound;  Surgeon: Evelina Bucy, DPM;  Location: [place];  Service: Podiatry;  Laterality: Right;  . AMPUTATION Left 08/20/2018   Procedure: FIFTH METATARSAL BONE BIOPSY;  Surgeon: Evelina Bucy, DPM;  Location: [place];  Service: Podiatry;  Laterality: Left;  . AMPUTATION Left 10/28/2018   Procedure: LEFT GREAT TOE AMPUTATION;  Surgeon: Evelina Bucy, DPM;  Location: [place];  Service: Podiatry;  Laterality: Left;  . AMPUTATION Left 09/16/2020   Procedure: AMPUTATION BELOW KNEE;  Surgeon: Rosetta Posner, MD;  Location: [place];  Service: Vascular;  Laterality: Left;  . APPLICATION OF WOUND VAC  02/02/2018   Procedure: APPLICATION OF WOUND VAC  Right Foot;  Surgeon: Evelina Bucy, DPM;  Location: [place];  Service: Podiatry;;  . APPLICATION OF WOUND VAC Left 10/28/2018   Procedure: APPLICATION OF WOUND VAC LEFT TOE;  Surgeon: Evelina Bucy, DPM;  Location: [place];  Service: Podiatry;  Laterality: Left;  . APPLICATION OF WOUND VAC Left 11/01/2018   Procedure: APPLICATION OF WOUND VAC;  Surgeon: Evelina Bucy, DPM;   Location: [place];  Service: Podiatry;  Laterality: Left;  . AV FISTULA PLACEMENT     left arm.  . AV FISTULA PLACEMENT Right 12/22/2016   Procedure: INSERTION OF ARTERIOVENOUS (AV) GORE-TEX GRAFT ARM;  Surgeon: Elam Dutch, MD;  Location: [facility] OR;  Service: Vascular;  Laterality: Right;  . AV FISTULA PLACEMENT Left 05/26/2018   Procedure: INSERTION OF  ARTERIOVENOUS (AV) GORE-TEX GRAFT LEFT ARM;  Surgeon: Serafina Mitchell, MD;  Location: [place];  Service: Vascular;  Laterality: Left;  . EYE SURGERY    . I & D EXTREMITY Right 10/31/2017   Procedure: IRRIGATION AND DEBRIDEMENT RIGHT FOOT;  Surgeon: Evelina Bucy, DPM;  Location: [place];  Service: Podiatry;  Laterality: Right;  . I & D EXTREMITY Left 08/20/2018   Procedure: IRRIGATION AND DEBRIDEMENT EXTREMITY WITH SECONDARY WOUND CLOSUREAND APPLICATION OF WOUND VAC LEFT FOOT;  Surgeon: Evelina Bucy, DPM;  Location: [place];  Service: Podiatry;  Laterality: Left;  . I & D EXTREMITY Left 10/20/2018   Procedure: IRRIGATION AND DEBRIDEMENT LEFT FOOT  DEBRIDEMENT LATERAL FOOT WOUND;  Surgeon: Evelina Bucy, DPM;  Location: [place];  Service: Podiatry;  Laterality: Left;  . I & D EXTREMITY Left 10/28/2018   Procedure: IRRIGATION AND DEBRIDEMENT LEFT TOE;  Surgeon: Evelina Bucy, DPM;  Location: [place];  Service: Podiatry;  Laterality: Left;  . I & D EXTREMITY Left 09/14/2020   Procedure:  IRRIGATION AND DEBRIDEMENT WRIST;  Surgeon: Leanora Cover, MD;  Location: [place];  Service: Orthopedics;  Laterality: Left;  . INSERTION OF DIALYSIS CATHETER     Right subclavian  . IR AV DIALY SHUNT INTRO NEEDLE/INTRACATH INITIAL W/PTA/IMG RIGHT Right 02/05/2018  . IR FLUORO GUIDE CV LINE RIGHT  01/31/2020  . IR THROMBECTOMY AV FISTULA W/THROMBOLYSIS/PTA INC/SHUNT/IMG LEFT Left 08/24/2018  . IR THROMBECTOMY AV FISTULA W/THROMBOLYSIS/PTA INC/SHUNT/IMG LEFT Left 01/06/2019  . IR US GUIDE VASC ACCESS LEFT  08/24/2018  . IR US GUIDE VASC ACCESS RIGHT  02/05/2018  . IR US  GUIDE VASC ACCESS RIGHT  01/31/2020  . IRRIGATION AND DEBRIDEMENT FOOT Right 10/23/2018   Procedure: Irrigation and Debridement to tendon, Left Foot;  Surgeon: Evelina Bucy, DPM;  Location: [place];  Service: Podiatry;  Laterality: Right;  . IRRIGATION AND DEBRIDEMENT FOOT Left 11/01/2018   Procedure: IRRIGATION AND DEBRIDEMENT PARTIAL WOUND CLOSURE LOCAL TISSUE TRANSFER AND FLAP ROTATION, LEFT FOOT;  Surgeon: Evelina Bucy, DPM;  Location: [place];  Service: Podiatry;  Laterality: Left;  . TRANSMETATARSAL AMPUTATION N/A 08/18/2018   Procedure: IRRIGATION AND DEBRIDEMENT OF LEFT 5TH TOE AND TRANSMETATARSAL, WITH PARTICAL LEFT 5TH TOE AND METATARSAL AMPUTATION, BONE BIOPSY, WOUND VAC APPLICATION.;  Surgeon: Evelina Bucy, DPM;  Location: [place];  Service: Podiatry;  Laterality: N/A;  . UPPER EXTREMITY VENOGRAPHY N/A 11/16/2016   Procedure: Upper Extremity Venography - Right Central;  Surgeon: Elam Dutch, MD;  Location: [place] CV LAB;  Service: Cardiovascular;  Laterality: N/A;  . UPPER EXTREMITY VENOGRAPHY N/A 05/25/2018   Procedure: UPPER EXTREMITY VENOGRAPHY - Bilateral;  Surgeon: Marty Heck, MD;  Location: [place] CV LAB;  Service: Cardiovascular;  Laterality: N/A;    No Known Allergies  Prior to Admission medications   Medication Sig Start Date End Date Taking? Authorizing Provider  albuterol (VENTOLIN HFA) 108 (90 Base) MCG/ACT inhaler Inhale 2 puffs into the lungs every 6 (six) hours as needed for wheezing or shortness of breath. 02/18/20   Thurnell Lose, MD  becaplermin (REGRANEX) 0.01 % gel Apply 1 application topically daily. Patient not taking: No sig reported 08/31/20   Evelina Bucy, DPM  Blood Glucose Monitoring Suppl (ONETOUCH VERIO FLEX SYSTEM) w/Device KIT 1 Bag by Does not apply route 3 (three) times daily before meals. 05/02/20   Kerin Perna, NP  calcitRIOL (ROCALTROL) 0.5 MCG capsule Take 5 capsules (2.5 mcg total) by mouth every Monday,  Wednesday, and Friday with hemodialysis. 10/26/18   Modena Jansky, MD  cinacalcet (SENSIPAR) 60 MG tablet SMARTSIG:2 Tablet(s) By Mouth Every Evening 04/24/20   [provider]  glucose blood (ONETOUCH VERIO) test strip Check blood sugars three times daily 07/01/20   Kerin Perna, NP  ibuprofen (ADVIL) 600 MG tablet Take 1 tablet (600 mg total) by mouth every 6 (six) hours as needed for up to 30 doses for moderate pain. 10/11/20   Antonieta Pert, MD  Lancets (ONETOUCH DELICA PLUS [id number]) [place] Apply topically. 05/02/20   [provider]  Methoxy PEG-Epoetin Beta (MIRCERA IJ) Dialysis med 04/19/20 04/18/21  [provider]  NOVOLOG 100 UNIT/ML injection INJECT 6 UNITS SUBCUTANEOUSLY TWICE DAILY WITH A MEAL 07/19/20   Kerin Perna, NP  Nutritional Supplements (,FEEDING SUPPLEMENT, PROSOURCE PLUS) liquid Take 30 mLs by mouth 3 (three) times daily between meals for 30 doses. 10/11/20 10/21/20  Antonieta Pert, MD  oxyCODONE-acetaminophen (PERCOCET) 5-325 MG tablet Take 1 tablet by mouth every 6 (six)  hours as needed for up to 12 doses for severe pain. 10/11/20   Antonieta Pert, MD  sevelamer carbonate (RENVELA) 800 MG tablet Take 2,400-4,000 mg by mouth See admin instructions. Take 5 tablets with meals then take 3 tablets with snacks 04/27/19   [provider]  sodium zirconium cyclosilicate (LOKELMA) 10 g PACK packet Take 10 g by mouth daily for 7 days. 10/11/20 10/18/20  Antonieta Pert, MD    Social History   Socioeconomic History  . Marital status: Single    Spouse name: Not on file  . Number of children: Not on file  . Years of education: Not on file  . Highest education level: Not on file  Occupational History  . Not on file  Tobacco Use  . Smoking status: Never Smoker  . Smokeless tobacco: Never Used  Vaping Use  . Vaping Use: Never used  Substance and Sexual Activity  . Alcohol use: No  . Drug use: No  . Sexual activity: Never  Other Topics Concern  . Not  on file  Social History Narrative  . Not on file   Social Determinants of Health   Financial Resource Strain: Not on file  Food Insecurity: Not on file  Transportation Needs: Not on file  Physical Activity: Not on file  Stress: Not on file  Social Connections: Not on file  Intimate Partner Violence: Not on file    Family History  Problem Relation Age of Onset  . Diabetes Mellitus II Other   . Diabetes Father   . Renal Disease Father        ESRD    ROS: Patient only complains of need for regular diet, pain medicine and to be in a room   Physical Examination  Vitals:   10/13/20 2015  BP: (!) 148/65  Pulse: 84  Resp: 18  Temp: 98.3 F (36.8 C)  SpO2: 100%   There is no height or weight on file to calculate BMI.  Patient was in the general waiting area the emergency department when I evaluated him.  There is a large dressing overlying his left below-knee amputation site.  Patient is easily agitated during our conversation.   CBC    Component Value Date/Time   WBC 8.0 10/11/2020 1403   RBC 2.53 (L) 10/11/2020 1403   HGB 7.5 (L) 10/11/2020 1403   HGB 9.7 (L) 08/22/2014 0447   HCT 24.7 (L) 10/11/2020 1403   HCT 30.0 (L) 08/22/2014 0447   PLT 242 10/11/2020 1403   PLT 158 08/22/2014 0447   MCV 97.6 10/11/2020 1403   MCV 97 08/22/2014 0447   MCH 29.6 10/11/2020 1403   MCHC 30.4 10/11/2020 1403   RDW 16.2 (H) 10/11/2020 1403   RDW 15.3 (H) 08/22/2014 0447   LYMPHSABS 1.0 09/15/2020 0424   LYMPHSABS 1.7 08/22/2014 0447   MONOABS 2.4 (H) 09/15/2020 0424   MONOABS 0.8 08/22/2014 0447   EOSABS 0.2 09/15/2020 0424   EOSABS 0.4 08/22/2014 0447   BASOSABS 0.2 (H) 09/15/2020 0424   BASOSABS 0.1 08/22/2014 0447    BMET    Component Value Date/Time   NA 131 (L) 10/11/2020 1230   NA 138 08/22/2014 0447   K 6.9 (HH) 10/11/2020 1230   K 4.0 08/22/2014 0447   CL 92 (L) 10/11/2020 1230   CL 100 08/22/2014 0447   CO2 27 10/11/2020 1230   CO2 30 08/22/2014 0447    GLUCOSE 162 (H) 10/11/2020 1230   GLUCOSE 88 08/22/2014 0447  BUN 63 (H) 10/11/2020 1230   BUN 14 08/22/2014 0447   CREATININE 7.50 (H) 10/11/2020 1230   CREATININE 5.00 (H) 08/22/2014 0447   CALCIUM 9.1 10/11/2020 1230   CALCIUM 8.2 (L) 08/22/2014 0447   GFRNONAA 9 (L) 10/11/2020 1230   GFRNONAA 14 (L) 08/22/2014 0447   GFRNONAA 5 (L) 06/19/2014 0937   GFRAA 7 (L) 05/30/2020 1923   GFRAA 17 (L) 08/22/2014 0447   GFRAA 6 (L) 06/19/2014 0937    COAGS: Lab Results  Component Value Date   INR 1.2 07/22/2020   INR 1.0 05/30/2020   INR 1.2 04/24/2019       ASSESSMENT/PLAN: This is a 40 y.o. male status post left below-knee amputation.  This has dehisced by the photos that are in the chart.  I discussed with him that his best course of action is likely going to be a left above-knee amputation.  Although I have not visualized the wound directly the images in the chart are quite traumatic and he previously had nonhealing wound on the lateral aspect of his below-knee amputation already rendering him high risk for conversion to above-knee.  I have offered this operation to the patient in the morning but at this time he is unwilling to proceed and would like a second opinion.  He will get admitted to the hospital as he has agreed to remain n.p.o. past midnight and we can further discuss course of action in the morning.  Alysia Scism C. Donzetta Matters, MD [facility] Office: [phone] Pager: [phone]

## 2020-10-14 ENCOUNTER — Emergency Department (HOSPITAL_COMMUNITY): Payer: Medicare HMO | Admitting: Certified Registered Nurse Anesthetist

## 2020-10-14 ENCOUNTER — Encounter (HOSPITAL_COMMUNITY): Payer: Self-pay | Admitting: Certified Registered Nurse Anesthetist

## 2020-10-14 ENCOUNTER — Telehealth: Payer: Self-pay

## 2020-10-14 ENCOUNTER — Encounter (HOSPITAL_COMMUNITY)
Admission: EM | Disposition: A | Discharge status: Home / Self Care | Payer: Self-pay | Source: Home / Self Care | Attending: Internal Medicine

## 2020-10-14 DIAGNOSIS — D62 Acute posthemorrhagic anemia: Secondary | ICD-10-CM | POA: Diagnosis not present

## 2020-10-14 DIAGNOSIS — Z833 Family history of diabetes mellitus: Secondary | ICD-10-CM | POA: Diagnosis not present

## 2020-10-14 DIAGNOSIS — D631 Anemia in chronic kidney disease: Secondary | ICD-10-CM | POA: Diagnosis present

## 2020-10-14 DIAGNOSIS — I120 Hypertensive chronic kidney disease with stage 5 chronic kidney disease or end stage renal disease: Secondary | ICD-10-CM | POA: Diagnosis not present

## 2020-10-14 DIAGNOSIS — N186 End stage renal disease: Secondary | ICD-10-CM | POA: Diagnosis not present

## 2020-10-14 DIAGNOSIS — Y835 Amputation of limb(s) as the cause of abnormal reaction of the patient, or of later complication, without mention of misadventure at the time of the procedure: Secondary | ICD-10-CM | POA: Diagnosis not present

## 2020-10-14 DIAGNOSIS — T8781 Dehiscence of amputation stump: Secondary | ICD-10-CM | POA: Diagnosis not present

## 2020-10-14 DIAGNOSIS — E1129 Type 2 diabetes mellitus with other diabetic kidney complication: Secondary | ICD-10-CM | POA: Diagnosis not present

## 2020-10-14 DIAGNOSIS — Z992 Dependence on renal dialysis: Secondary | ICD-10-CM | POA: Diagnosis not present

## 2020-10-14 DIAGNOSIS — W19XXXA Unspecified fall, initial encounter: Secondary | ICD-10-CM | POA: Diagnosis not present

## 2020-10-14 DIAGNOSIS — E1143 Type 2 diabetes mellitus with diabetic autonomic (poly)neuropathy: Secondary | ICD-10-CM | POA: Diagnosis present

## 2020-10-14 DIAGNOSIS — R69 Illness, unspecified: Secondary | ICD-10-CM | POA: Diagnosis not present

## 2020-10-14 DIAGNOSIS — Z20822 Contact with and (suspected) exposure to covid-19: Secondary | ICD-10-CM | POA: Diagnosis not present

## 2020-10-14 DIAGNOSIS — L97129 Non-pressure chronic ulcer of left thigh with unspecified severity: Secondary | ICD-10-CM | POA: Diagnosis not present

## 2020-10-14 DIAGNOSIS — Z794 Long term (current) use of insulin: Secondary | ICD-10-CM | POA: Diagnosis not present

## 2020-10-14 DIAGNOSIS — L089 Local infection of the skin and subcutaneous tissue, unspecified: Secondary | ICD-10-CM | POA: Diagnosis not present

## 2020-10-14 DIAGNOSIS — T8130XA Disruption of wound, unspecified, initial encounter: Secondary | ICD-10-CM

## 2020-10-14 DIAGNOSIS — F609 Personality disorder, unspecified: Secondary | ICD-10-CM | POA: Diagnosis present

## 2020-10-14 DIAGNOSIS — I70241 Atherosclerosis of native arteries of left leg with ulceration of thigh: Secondary | ICD-10-CM | POA: Diagnosis not present

## 2020-10-14 DIAGNOSIS — E11628 Type 2 diabetes mellitus with other skin complications: Secondary | ICD-10-CM | POA: Diagnosis not present

## 2020-10-14 DIAGNOSIS — E871 Hypo-osmolality and hyponatremia: Secondary | ICD-10-CM | POA: Diagnosis not present

## 2020-10-14 DIAGNOSIS — N250 Renal osteodystrophy: Secondary | ICD-10-CM | POA: Diagnosis not present

## 2020-10-14 DIAGNOSIS — E1122 Type 2 diabetes mellitus with diabetic chronic kidney disease: Secondary | ICD-10-CM | POA: Diagnosis not present

## 2020-10-14 DIAGNOSIS — M79605 Pain in left leg: Secondary | ICD-10-CM | POA: Diagnosis not present

## 2020-10-14 DIAGNOSIS — R296 Repeated falls: Secondary | ICD-10-CM | POA: Diagnosis present

## 2020-10-14 DIAGNOSIS — Z8616 Personal history of COVID-19: Secondary | ICD-10-CM | POA: Diagnosis not present

## 2020-10-14 DIAGNOSIS — E1165 Type 2 diabetes mellitus with hyperglycemia: Secondary | ICD-10-CM | POA: Diagnosis present

## 2020-10-14 DIAGNOSIS — E875 Hyperkalemia: Secondary | ICD-10-CM | POA: Diagnosis not present

## 2020-10-14 DIAGNOSIS — L89892 Pressure ulcer of other site, stage 2: Secondary | ICD-10-CM | POA: Diagnosis present

## 2020-10-14 DIAGNOSIS — D649 Anemia, unspecified: Secondary | ICD-10-CM | POA: Diagnosis not present

## 2020-10-14 DIAGNOSIS — F319 Bipolar disorder, unspecified: Secondary | ICD-10-CM | POA: Diagnosis present

## 2020-10-14 DIAGNOSIS — N2581 Secondary hyperparathyroidism of renal origin: Secondary | ICD-10-CM | POA: Diagnosis not present

## 2020-10-14 LAB — CBC WITH DIFFERENTIAL/PLATELET
Abs Immature Granulocytes: 0.03 10*3/uL (ref 0.00–0.07)
Basophils Absolute: 0.1 10*3/uL (ref 0.0–0.1)
Basophils Relative: 1 %
Eosinophils Absolute: 0.5 10*3/uL (ref 0.0–0.5)
Eosinophils Relative: 6 %
HCT: 24.3 % — ABNORMAL LOW (ref 39.0–52.0)
Hemoglobin: 7.6 g/dL — ABNORMAL LOW (ref 13.0–17.0)
Immature Granulocytes: 0 %
Lymphocytes Relative: 14 %
Lymphs Abs: 1.3 10*3/uL (ref 0.7–4.0)
MCH: 30 pg (ref 26.0–34.0)
MCHC: 31.3 g/dL (ref 30.0–36.0)
MCV: 96 fL (ref 80.0–100.0)
Monocytes Absolute: 0.7 10*3/uL (ref 0.1–1.0)
Monocytes Relative: 7 %
Neutro Abs: 6.9 10*3/uL (ref 1.7–7.7)
Neutrophils Relative %: 72 %
Platelets: 252 10*3/uL (ref 150–400)
RBC: 2.53 MIL/uL — ABNORMAL LOW (ref 4.22–5.81)
RDW: 16 % — ABNORMAL HIGH (ref 11.5–15.5)
WBC: 9.6 10*3/uL (ref 4.0–10.5)
nRBC: 0 % (ref 0.0–0.2)

## 2020-10-14 LAB — BASIC METABOLIC PANEL
Anion gap: 14 (ref 5–15)
Anion gap: 15 (ref 5–15)
BUN: 72 mg/dL — ABNORMAL HIGH (ref 6–20)
BUN: 73 mg/dL — ABNORMAL HIGH (ref 6–20)
CO2: 22 mmol/L (ref 22–32)
CO2: 21 mmol/L — ABNORMAL LOW (ref 22–32)
Calcium: 7.9 mg/dL — ABNORMAL LOW (ref 8.9–10.3)
Calcium: 8.1 mg/dL — ABNORMAL LOW (ref 8.9–10.3)
Chloride: 95 mmol/L — ABNORMAL LOW (ref 98–111)
Chloride: 93 mmol/L — ABNORMAL LOW (ref 98–111)
Creatinine, Ser: 9.24 mg/dL — ABNORMAL HIGH (ref 0.61–1.24)
Creatinine, Ser: 9.33 mg/dL — ABNORMAL HIGH (ref 0.61–1.24)
GFR, Estimated: 7 mL/min — ABNORMAL LOW (ref >60)
GFR, Estimated: 7 mL/min — ABNORMAL LOW (ref >60)
Glucose, Bld: 301 mg/dL — ABNORMAL HIGH (ref 70–99)
Glucose, Bld: 322 mg/dL — ABNORMAL HIGH (ref 70–99)
Potassium: 6.9 mmol/L (ref 3.5–5.1)
Potassium: 6.1 mmol/L — ABNORMAL HIGH (ref 3.5–5.1)
Sodium: 131 mmol/L — ABNORMAL LOW (ref 135–145)
Sodium: 129 mmol/L — ABNORMAL LOW (ref 135–145)

## 2020-10-14 LAB — IRON AND TIBC
Iron: 20 ug/dL — ABNORMAL LOW (ref 45–182)
Saturation Ratios: 7 % — ABNORMAL LOW (ref 17.9–39.5)
TIBC: 284 ug/dL (ref 250–450)
UIBC: 264 ug/dL

## 2020-10-14 LAB — POCT I-STAT, CHEM 8
BUN: 82 mg/dL — ABNORMAL HIGH (ref 6–20)
Calcium, Ion: 1.04 mmol/L — ABNORMAL LOW (ref 1.15–1.40)
Chloride: 99 mmol/L (ref 98–111)
Creatinine, Ser: 9.3 mg/dL — ABNORMAL HIGH (ref 0.61–1.24)
Glucose, Bld: 286 mg/dL — ABNORMAL HIGH (ref 70–99)
HCT: 24 % — ABNORMAL LOW (ref 39.0–52.0)
Hemoglobin: 8.2 g/dL — ABNORMAL LOW (ref 13.0–17.0)
Potassium: 7 mmol/L (ref 3.5–5.1)
Sodium: 129 mmol/L — ABNORMAL LOW (ref 135–145)
TCO2: 24 mmol/L (ref 22–32)

## 2020-10-14 LAB — RESP PANEL BY RT-PCR (FLU A&B, COVID) ARPGX2
Influenza A by PCR: NEGATIVE
Influenza B by PCR: NEGATIVE
SARS Coronavirus 2 by RT PCR: NEGATIVE

## 2020-10-14 LAB — GLUCOSE, CAPILLARY
Glucose-Capillary: 331 mg/dL — ABNORMAL HIGH (ref 70–99)
Glucose-Capillary: 167 mg/dL — ABNORMAL HIGH (ref 70–99)

## 2020-10-14 LAB — CBG MONITORING, ED: Glucose-Capillary: 215 mg/dL — ABNORMAL HIGH (ref 70–99)

## 2020-10-14 LAB — FERRITIN: Ferritin: 1021 ng/mL — ABNORMAL HIGH (ref 24–336)

## 2020-10-14 SURGERY — AMPUTATION, ABOVE KNEE
Anesthesia: General | Site: Knee | Laterality: Left

## 2020-10-14 MED ORDER — HYDROMORPHONE HCL 1 MG/ML IJ SOLN
1.0000 mg | Freq: Once | INTRAMUSCULAR | Status: AC
  Administered 2020-10-14: 1 mg via INTRAVENOUS
  Filled 2020-10-14: qty 1

## 2020-10-14 MED ORDER — INSULIN ASPART 100 UNIT/ML ~~LOC~~ SOLN
SUBCUTANEOUS | Status: AC
  Administered 2020-10-14: 10 [IU] via INTRAVENOUS
  Filled 2020-10-14: qty 1

## 2020-10-14 MED ORDER — INSULIN ASPART 100 UNIT/ML ~~LOC~~ SOLN
0.0000 [IU] | Freq: Three times a day (TID) | SUBCUTANEOUS | Status: DC
  Administered 2020-10-15: 3 [IU] via SUBCUTANEOUS
  Administered 2020-10-15: 1 [IU] via SUBCUTANEOUS
  Administered 2020-10-15: 4 [IU] via SUBCUTANEOUS
  Administered 2020-10-16: 1 [IU] via SUBCUTANEOUS
  Administered 2020-10-16: 3 [IU] via SUBCUTANEOUS
  Administered 2020-10-17 (×2): 2 [IU] via SUBCUTANEOUS
  Administered 2020-10-17: 3 [IU] via SUBCUTANEOUS
  Administered 2020-10-18: 5 [IU] via SUBCUTANEOUS
  Administered 2020-10-19: 3 [IU] via SUBCUTANEOUS
  Administered 2020-10-19: 6 [IU] via SUBCUTANEOUS
  Administered 2020-10-19: 4 [IU] via SUBCUTANEOUS
  Administered 2020-10-20: 1 [IU] via SUBCUTANEOUS
  Administered 2020-10-20: 4 [IU] via SUBCUTANEOUS
  Administered 2020-10-21 (×3): 1 [IU] via SUBCUTANEOUS

## 2020-10-14 MED ORDER — OXYCODONE-ACETAMINOPHEN 5-325 MG PO TABS: 1.0000 | ORAL_TABLET | Freq: Four times a day (QID) | ORAL | Status: DC | PRN

## 2020-10-14 MED ORDER — INSULIN ASPART 100 UNIT/ML IV SOLN: 10.0000 [IU] | Freq: Once | INTRAVENOUS | Status: AC

## 2020-10-14 MED ORDER — CHLORHEXIDINE GLUCONATE CLOTH 2 % EX PADS
6.0000 | MEDICATED_PAD | Freq: Every day | CUTANEOUS | Status: DC
  Administered 2020-10-14 – 2020-10-21 (×7): 6 via TOPICAL

## 2020-10-14 MED ORDER — MIDAZOLAM HCL 2 MG/2ML IJ SOLN
INTRAMUSCULAR | Status: AC
  Filled 2020-10-14: qty 2

## 2020-10-14 MED ORDER — CINACALCET HCL 30 MG PO TABS
60.0000 mg | ORAL_TABLET | Freq: Two times a day (BID) | ORAL | Status: DC
  Administered 2020-10-14 – 2020-10-21 (×15): 60 mg via ORAL
  Filled 2020-10-14 (×15): qty 2

## 2020-10-14 MED ORDER — HYDROMORPHONE HCL 1 MG/ML IJ SOLN
0.5000 mg | INTRAMUSCULAR | Status: DC | PRN
  Administered 2020-10-14 (×2): 1 mg via INTRAVENOUS
  Filled 2020-10-14 (×2): qty 1

## 2020-10-14 MED ORDER — CALCITRIOL 0.5 MCG PO CAPS
2.5000 ug | ORAL_CAPSULE | ORAL | Status: DC
  Administered 2020-10-16 – 2020-10-21 (×3): 2.5 ug via ORAL
  Filled 2020-10-14: qty 5

## 2020-10-14 MED ORDER — PROSOURCE PLUS PO LIQD
30.0000 mL | Freq: Three times a day (TID) | ORAL | Status: DC
  Administered 2020-10-14 – 2020-10-21 (×18): 30 mL via ORAL
  Filled 2020-10-14 (×18): qty 30

## 2020-10-14 MED ORDER — IBUPROFEN 600 MG PO TABS: 600.0000 mg | ORAL_TABLET | Freq: Four times a day (QID) | ORAL | Status: DC | PRN

## 2020-10-14 MED ORDER — SODIUM ZIRCONIUM CYCLOSILICATE 10 G PO PACK: 10.0000 g | PACK | Freq: Every day | ORAL | Status: DC

## 2020-10-14 MED ORDER — CEFAZOLIN SODIUM-DEXTROSE 2-4 GM/100ML-% IV SOLN: 2.0000 g | Freq: Once | INTRAVENOUS | Status: DC

## 2020-10-14 MED ORDER — SEVELAMER CARBONATE 800 MG PO TABS
4000.0000 mg | ORAL_TABLET | Freq: Three times a day (TID) | ORAL | Status: DC
  Administered 2020-10-14 – 2020-10-21 (×21): 4000 mg via ORAL
  Filled 2020-10-14 (×22): qty 5

## 2020-10-14 MED ORDER — INSULIN ASPART 100 UNIT/ML ~~LOC~~ SOLN
3.0000 [IU] | Freq: Three times a day (TID) | SUBCUTANEOUS | Status: DC
  Administered 2020-10-15 – 2020-10-17 (×7): 3 [IU] via SUBCUTANEOUS

## 2020-10-14 MED ORDER — FENTANYL CITRATE (PF) 100 MCG/2ML IJ SOLN
INTRAMUSCULAR | Status: AC
  Administered 2020-10-14: 100 ug via INTRAVENOUS
  Filled 2020-10-14: qty 2

## 2020-10-14 MED ORDER — CEFAZOLIN SODIUM-DEXTROSE 2-4 GM/100ML-% IV SOLN
INTRAVENOUS | Status: AC
  Filled 2020-10-14: qty 100

## 2020-10-14 MED ORDER — PROPOFOL 10 MG/ML IV BOLUS
INTRAVENOUS | Status: AC
  Filled 2020-10-14: qty 20

## 2020-10-14 MED ORDER — SODIUM ZIRCONIUM CYCLOSILICATE 10 G PO PACK
10.0000 g | PACK | Freq: Three times a day (TID) | ORAL | Status: AC
  Administered 2020-10-14 (×2): 10 g via ORAL
  Filled 2020-10-14 (×2): qty 1

## 2020-10-14 MED ORDER — SODIUM CHLORIDE 0.9 % IV SOLN: INTRAVENOUS | Status: DC

## 2020-10-14 MED ORDER — HEPARIN SODIUM (PORCINE) 1000 UNIT/ML IJ SOLN
2000.0000 [IU] | Freq: Once | INTRAMUSCULAR | Status: AC
  Administered 2020-10-14: 1900 [IU] via INTRAVENOUS

## 2020-10-14 MED ORDER — FENTANYL CITRATE (PF) 100 MCG/2ML IJ SOLN: 100.0000 ug | Freq: Once | INTRAMUSCULAR | Status: AC

## 2020-10-14 MED ORDER — FENTANYL CITRATE (PF) 250 MCG/5ML IJ SOLN
INTRAMUSCULAR | Status: AC
  Filled 2020-10-14: qty 5

## 2020-10-14 MED ORDER — OXYCODONE-ACETAMINOPHEN 5-325 MG PO TABS
ORAL_TABLET | ORAL | Status: AC
  Administered 2020-10-14: 1 via ORAL
  Filled 2020-10-14: qty 1

## 2020-10-14 MED ORDER — CHLORHEXIDINE GLUCONATE 0.12 % MT SOLN
15.0000 mL | Freq: Once | OROMUCOSAL | Status: AC
  Administered 2020-10-14: 15 mL via OROMUCOSAL
  Filled 2020-10-14: qty 15

## 2020-10-14 MED ORDER — SEVELAMER CARBONATE 800 MG PO TABS: 2400.0000 mg | ORAL_TABLET | ORAL | Status: DC

## 2020-10-14 MED ORDER — ALBUTEROL SULFATE HFA 108 (90 BASE) MCG/ACT IN AERS: 2.0000 | INHALATION_SPRAY | Freq: Four times a day (QID) | RESPIRATORY_TRACT | Status: DC | PRN

## 2020-10-14 MED ORDER — OXYCODONE-ACETAMINOPHEN 5-325 MG PO TABS
1.0000 | ORAL_TABLET | ORAL | Status: DC | PRN
  Administered 2020-10-14: 1 via ORAL
  Filled 2020-10-14: qty 1

## 2020-10-14 MED ORDER — SEVELAMER CARBONATE 800 MG PO TABS
2400.0000 mg | ORAL_TABLET | ORAL | Status: DC | PRN
  Filled 2020-10-14: qty 3

## 2020-10-14 MED ORDER — HEPARIN SODIUM (PORCINE) 5000 UNIT/ML IJ SOLN
5000.0000 [IU] | Freq: Two times a day (BID) | INTRAMUSCULAR | Status: DC
  Administered 2020-10-14 – 2020-10-21 (×10): 5000 [IU] via SUBCUTANEOUS
  Filled 2020-10-14 (×12): qty 1

## 2020-10-14 NOTE — Progress Notes (Addendum)
NEW ADMISSION NOTE  Arrival Method: bed Mental Orientation: Alert and oriented x4 Telemetry: yes Assessment: Completed Skin: see notes, left BKA dishescence  Iv: right forearm Pain: 10 Tubes: 1 Safety Measures: Safety Fall Prevention Plan has been given, discussed and signed Admission: Completed [facility] Orientation: Patient has been orientated to the room, unit and staff.  Family: 0  Orders have been reviewed and implemented. Will continue to monitor the patient. Call light has been placed within reach and bed alarm has been activated.   Patient arrived to the unit and the dressing came off leading to more active bleeding, RN had to redress the right BKA open dishescence.   Patient refused to wear telemetry, patient refused CBG check for 1700, patient also non compliant with renal carb modified and continues to eat any food items that he wants, and patient was educated and MD was notified, RN will continue to monitor this patient  Scabs on right lower leg, 5th digit amputation on right foot, fissure in middle buttocks and patient was non compliant with foam placed for butt prophylaxis to prevent additional skin breakdown.  Patient extremely agitated and irritable, emotional support was given to patient.   Beatris Ship, RN

## 2020-10-14 NOTE — H&P (View-Only) (Signed)
40 year old male with dehiscence of his left BKA as previously pictured.  He was scheduled for left AKA today.  Initial potassium was 7 and on recheck 6.9.  I have spoken with the hospitalist for admission and he states he will contact nephrology for dialysis.  Much appreciate the hospitalist service for management of this patient.  He is rescheduled for tomorrow with Dr. Oneida Alar for left AKA.  Please keep him n.p.o. after midnight.  Marty Heck, MD [facility] [place] Office: [place]

## 2020-10-14 NOTE — ED Notes (Signed)
Pt has a stage 2 wound on left anterior wrist. I applied a clean dry dressing to wound. Pt has left stump wrapped.

## 2020-10-14 NOTE — Progress Notes (Signed)
Patient reported to RN to alert the MD to change his diet order to a regular diet or he threatened to leave the hospital AMA, MD was notified and patient was educated, RN will continue to monitor this patient.

## 2020-10-14 NOTE — Progress Notes (Addendum)
CRITICAL VALUE ALERT  Critical Value:  6.9 Potassium  Date & Time Notied:  10/14/20 1308  Provider Notified: Paged Dr. Tobias [name] awaiting call back.  Orders Received/Actions taken: Dr. Tobias [name] called 1310 and stated to make Dr. Carlis Abbott aware

## 2020-10-14 NOTE — H&P (Signed)
History and Physical    [name] PIR:[id number] DOB: 1981-03-26 DOA: 10/13/2020  PCP: Kerin Perna, NP (Confirm with patient/family/NH records and if not entered, this has to be entered at [facility] point of entry) Patient coming from: Home  I have personally briefly reviewed patient's old medical records in [place]  Chief Complaint: Left leg pain  HPI: [name] is a 40 y.o. 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  Patient was discharged 4 days ago, and he claimed that since then he has been very unsteady and fell couple of times and yesterday he fell again and twisted the left leg, and stitches on the left BKA tore off in the morning tore open.  His last dialysis as last Friday.  Appeared that he had a right arm fentanyl patch was placed on the day of discharge 4 days ago and claims he never overdosed with narcotics.  Patient was examined by vascular surgery yesterday and plan for a AKA today but was re-scheduled for tomorrow. Hospitalist was consuledt for managing hyperkalemia and uncontrolled glucose.  Hospitalist was asked to admit patient for coordination of dialysis source control patient for in order for patient to go to the OR tomorrow.  Patient continues to complain about left leg pain, denies any fever chills, denies any shortness of breath lightheadedness fever chills.  Review of Systems: As per HPI otherwise 14 point review of systems negative.    Past Medical History:  Diagnosis Date  . Anemia   . Diabetes mellitus without complication ([place])   . Diabetic gastroparesis ([place])   . Dialysis patient ([place])   . Hypertension   . Renal disorder    Dialysis  . Sepsis [facility])     Past Surgical History:  Procedure Laterality Date  . AMPUTATION Right 02/02/2018   Procedure: RIGHT FIFTH TOE AND  METATARSAL AMPUTATION. Filetted toe flap metatarsal resection. Debridement Plantar Foot wound;  Surgeon: Evelina Bucy, DPM;  Location: [place];  Service: Podiatry;  Laterality: Right;  . AMPUTATION Left 08/20/2018   Procedure: FIFTH METATARSAL BONE BIOPSY;  Surgeon: Evelina Bucy, DPM;  Location: [place];  Service: Podiatry;  Laterality: Left;  . AMPUTATION Left 10/28/2018   Procedure: LEFT GREAT TOE AMPUTATION;  Surgeon: Evelina Bucy, DPM;  Location: [place];  Service: Podiatry;  Laterality: Left;  . AMPUTATION Left 09/16/2020   Procedure: AMPUTATION BELOW KNEE;  Surgeon: Rosetta Posner, MD;  Location: [place];  Service: Vascular;  Laterality: Left;  . APPLICATION OF WOUND VAC  02/02/2018   Procedure: APPLICATION OF WOUND VAC  Right Foot;  Surgeon: Evelina Bucy, DPM;  Location: [place];  Service: Podiatry;;  . APPLICATION OF WOUND VAC Left 10/28/2018   Procedure: APPLICATION OF WOUND VAC LEFT TOE;  Surgeon: Evelina Bucy, DPM;  Location: [place];  Service: Podiatry;  Laterality: Left;  . APPLICATION OF WOUND VAC Left 11/01/2018   Procedure: APPLICATION OF WOUND VAC;  Surgeon: Evelina Bucy, DPM;  Location: [place];  Service: Podiatry;  Laterality: Left;  . AV FISTULA PLACEMENT     left arm.  . AV FISTULA PLACEMENT Right 12/22/2016   Procedure: INSERTION OF ARTERIOVENOUS (AV) GORE-TEX GRAFT ARM;  Surgeon: Elam Dutch, MD;  Location: [place];  Service: Vascular;  Laterality: Right;  . AV FISTULA PLACEMENT Left 05/26/2018   Procedure: INSERTION  OF  ARTERIOVENOUS (AV) GORE-TEX GRAFT LEFT ARM;  Surgeon: Serafina Mitchell, MD;  Location: [place];  Service: Vascular;  Laterality: Left;  . EYE SURGERY    . I & D EXTREMITY Right 10/31/2017   Procedure: IRRIGATION AND DEBRIDEMENT RIGHT FOOT;  Surgeon: Evelina Bucy, DPM;  Location: [place];  Service: Podiatry;  Laterality: Right;  . I & D EXTREMITY Left 08/20/2018   Procedure: IRRIGATION AND DEBRIDEMENT EXTREMITY WITH SECONDARY WOUND CLOSUREAND  APPLICATION OF WOUND VAC LEFT FOOT;  Surgeon: Evelina Bucy, DPM;  Location: [place];  Service: Podiatry;  Laterality: Left;  . I & D EXTREMITY Left 10/20/2018   Procedure: IRRIGATION AND DEBRIDEMENT LEFT FOOT  DEBRIDEMENT LATERAL FOOT WOUND;  Surgeon: Evelina Bucy, DPM;  Location: [place];  Service: Podiatry;  Laterality: Left;  . I & D EXTREMITY Left 10/28/2018   Procedure: IRRIGATION AND DEBRIDEMENT LEFT TOE;  Surgeon: Evelina Bucy, DPM;  Location: [place];  Service: Podiatry;  Laterality: Left;  . I & D EXTREMITY Left 09/14/2020   Procedure: IRRIGATION AND DEBRIDEMENT WRIST;  Surgeon: Leanora Cover, MD;  Location: [place];  Service: Orthopedics;  Laterality: Left;  . INSERTION OF DIALYSIS CATHETER     Right subclavian  . IR AV DIALY SHUNT INTRO NEEDLE/INTRACATH INITIAL W/PTA/IMG RIGHT Right 02/05/2018  . IR FLUORO GUIDE CV LINE RIGHT  01/31/2020  . IR THROMBECTOMY AV FISTULA W/THROMBOLYSIS/PTA INC/SHUNT/IMG LEFT Left 08/24/2018  . IR THROMBECTOMY AV FISTULA W/THROMBOLYSIS/PTA INC/SHUNT/IMG LEFT Left 01/06/2019  . IR US GUIDE VASC ACCESS LEFT  08/24/2018  . IR US GUIDE VASC ACCESS RIGHT  02/05/2018  . IR US GUIDE VASC ACCESS RIGHT  01/31/2020  . IRRIGATION AND DEBRIDEMENT FOOT Right 10/23/2018   Procedure: Irrigation and Debridement to tendon, Left Foot;  Surgeon: Evelina Bucy, DPM;  Location: [place];  Service: Podiatry;  Laterality: Right;  . IRRIGATION AND DEBRIDEMENT FOOT Left 11/01/2018   Procedure: IRRIGATION AND DEBRIDEMENT PARTIAL WOUND CLOSURE LOCAL TISSUE TRANSFER AND FLAP ROTATION, LEFT FOOT;  Surgeon: Evelina Bucy, DPM;  Location: [place];  Service: Podiatry;  Laterality: Left;  . TRANSMETATARSAL AMPUTATION N/A 08/18/2018   Procedure: IRRIGATION AND DEBRIDEMENT OF LEFT 5TH TOE AND TRANSMETATARSAL, WITH PARTICAL LEFT 5TH TOE AND METATARSAL AMPUTATION, BONE BIOPSY, WOUND VAC APPLICATION.;  Surgeon: Evelina Bucy, DPM;  Location: [place];  Service: Podiatry;  Laterality: N/A;  . UPPER  EXTREMITY VENOGRAPHY N/A 11/16/2016   Procedure: Upper Extremity Venography - Right Central;  Surgeon: Elam Dutch, MD;  Location: [place] CV LAB;  Service: Cardiovascular;  Laterality: N/A;  . UPPER EXTREMITY VENOGRAPHY N/A 05/25/2018   Procedure: UPPER EXTREMITY VENOGRAPHY - Bilateral;  Surgeon: Marty Heck, MD;  Location: [place] [facility];  Service: Cardiovascular;  Laterality: N/A;     reports that he has never smoked. He has never used smokeless tobacco. He reports that he does not drink alcohol and does not use drugs.  No Known Allergies  Family History  Problem Relation Age of Onset  . Diabetes Mellitus II Other   . Diabetes Father   . Renal Disease Father        ESRD     Prior to Admission medications   Medication Sig Start Date End Date Taking? Authorizing Provider  albuterol (VENTOLIN HFA) 108 (90 Base) MCG/ACT inhaler Inhale 2 puffs into the lungs every 6 (six) hours as needed for wheezing or shortness of breath. 02/18/20  Yes Thurnell Lose, MD  NOVOLOG 100 UNIT/ML injection INJECT 6 UNITS SUBCUTANEOUSLY TWICE DAILY WITH A MEAL 07/19/20  Yes Kerin Perna, NP  oxyCODONE-acetaminophen (PERCOCET) 5-325 MG tablet Take 1 tablet by mouth every 6 (six) hours as needed for up to 12 doses for severe pain. 10/11/20  Yes Kc, Maren Beach, MD  becaplermin (REGRANEX) 0.01 % gel Apply 1 application topically daily. Patient not taking: No sig reported 08/31/20   Evelina Bucy, DPM  Blood Glucose Monitoring Suppl (ONETOUCH VERIO FLEX SYSTEM) w/Device KIT 1 Bag by Does not apply route 3 (three) times daily before meals. 05/02/20   Kerin Perna, NP  calcitRIOL (ROCALTROL) 0.5 MCG capsule Take 5 capsules (2.5 mcg total) by mouth every Monday, Wednesday, and Friday with hemodialysis. 10/26/18   Modena Jansky, MD  cinacalcet (SENSIPAR) 60 MG tablet SMARTSIG:2 Tablet(s) By Mouth Every Evening 04/24/20   [provider]  glucose blood (ONETOUCH VERIO) test  strip Check blood sugars three times daily 07/01/20   Kerin Perna, NP  ibuprofen (ADVIL) 600 MG tablet Take 1 tablet (600 mg total) by mouth every 6 (six) hours as needed for up to 30 doses for moderate pain. 10/11/20   Antonieta Pert, MD  Lancets (ONETOUCH DELICA PLUS [id number]) [place] Apply topically. 05/02/20   [provider]  Methoxy PEG-Epoetin Beta (MIRCERA IJ) Dialysis med 04/19/20 04/18/21  [provider]  Nutritional Supplements (,FEEDING SUPPLEMENT, PROSOURCE PLUS) liquid Take 30 mLs by mouth 3 (three) times daily between meals for 30 doses. 10/11/20 10/21/20  Antonieta Pert, MD  sevelamer carbonate (RENVELA) 800 MG tablet Take 2,400-4,000 mg by mouth See admin instructions. Take 5 tablets with meals then take 3 tablets with snacks 04/27/19   [provider]  sodium zirconium cyclosilicate (LOKELMA) 10 g PACK packet Take 10 g by mouth daily for 7 days. 10/11/20 10/18/20  Antonieta Pert, MD    Physical Exam: Vitals:   10/14/20 0838 10/14/20 0839 10/14/20 0845 10/14/20 0930  BP:   (!) 165/114 (!) 137/94  Pulse: 94 94 95 93  Resp:  (!) '29 14 12  ' Temp:      TempSrc:      SpO2: 98% 100% 99% 97%    Constitutional: NAD, calm, comfortable Vitals:   10/14/20 0838 10/14/20 0839 10/14/20 0845 10/14/20 0930  BP:   (!) 165/114 (!) 137/94  Pulse: 94 94 95 93  Resp:  (!) '29 14 12  ' Temp:      TempSrc:      SpO2: 98% 100% 99% 97%   Eyes: PERRL, lids and conjunctivae normal ENMT: Mucous membranes are moist. Posterior pharynx clear of any exudate or lesions.Normal dentition.  Neck: normal, supple, no masses, no thyromegaly Respiratory: clear to auscultation bilaterally, no wheezing, no crackles. Normal respiratory effort. No accessory muscle use.  Cardiovascular: Regular rate and rhythm, no murmurs / rubs / gallops. No extremity edema. 2+ pedal pulses. No carotid bruits.  Abdomen: no tenderness, no masses palpated. No hepatosplenomegaly. Bowel sounds positive.  Musculoskeletal:  no clubbing / cyanosis. No joint deformity upper and lower extremities. Good ROM, no contractures. Normal muscle tone.  Skin: Large open wound on left BKA stump Neurologic: CN 2-12 grossly intact. Sensation intact, DTR normal. Strength 5/5 in all 4.  Psychiatric: Normal judgment and insight. Alert and oriented x 3. Normal mood.       Labs on Admission: I have personally reviewed following labs and imaging studies  CBC: Recent Labs  Lab 10/09/20 0304 10/11/20 1403 10/14/20 1020 10/14/20 1153  WBC 7.6 8.0 9.6  --   NEUTROABS  --   --  6.9  --   HGB 7.1* 7.5* 7.6* 8.2*  HCT 25.3* 24.7* 24.3* 24.0*  MCV 100.4* 97.6 96.0  --   PLT 162 242 252  --    Basic Metabolic Panel: Recent Labs  Lab 10/09/20 0304 10/09/20 1311 10/11/20 1230 10/14/20 1153 10/14/20 1209  NA 131*  --  131* 129* 131*  K 6.7* 4.9 6.9* 7.0* 6.9*  CL 93*  --  92* 99 95*  CO2 29  --  27  --  22  GLUCOSE 227*  --  162* 286* 301*  BUN 47*  --  63* 82* 72*  CREATININE 6.02*  --  7.50* 9.30* 9.24*  CALCIUM 9.0  --  9.1  --  7.9*  PHOS  --   --  5.7*  --   --    GFR: Estimated Creatinine Clearance: 9.8 mL/min (A) (by C-G formula based on SCr of 9.24 mg/dL (H)). Liver Function Tests: Recent Labs  Lab 10/11/20 1230  ALBUMIN 2.5*   No results for input(s): LIPASE, AMYLASE in the last 168 hours. No results for input(s): AMMONIA in the last 168 hours. Coagulation Profile: No results for input(s): INR, PROTIME in the last 168 hours. Cardiac Enzymes: No results for input(s): CKTOTAL, CKMB, CKMBINDEX, TROPONINI in the last 168 hours. BNP (last 3 results) No results for input(s): PROBNP in the last 8760 hours. HbA1C: No results for input(s): HGBA1C in the last 72 hours. CBG: Recent Labs  Lab 10/11/20 0334 10/11/20 0419 10/11/20 0647 10/11/20 1203 10/14/20 0959  GLUCAP 56* 82 130* 195* 215*   Lipid Profile: No results for input(s): CHOL, HDL, LDLCALC, TRIG, CHOLHDL, LDLDIRECT in the last 72  hours. Thyroid Function Tests: No results for input(s): TSH, T4TOTAL, FREET4, T3FREE, THYROIDAB in the last 72 hours. Anemia Panel: No results for input(s): VITAMINB12, FOLATE, FERRITIN, TIBC, IRON, RETICCTPCT in the last 72 hours. Urine analysis:    Component Value Date/Time   COLORURINE YELLOW 05/26/2015 1115   APPEARANCEUR CLEAR 05/26/2015 1115   APPEARANCEUR Hazy 07/30/2014 2303   LABSPEC 1.016 05/26/2015 1115   LABSPEC 1.012 07/30/2014 2303   PHURINE 8.0 05/26/2015 1115   GLUCOSEU 500 (A) 05/26/2015 1115   GLUCOSEU >=500 07/30/2014 2303   HGBUR MODERATE (A) 05/26/2015 1115   BILIRUBINUR NEGATIVE 05/26/2015 1115   BILIRUBINUR Negative 07/30/2014 2303   KETONESUR 15 (A) 05/26/2015 1115   PROTEINUR >300 (A) 05/26/2015 1115   UROBILINOGEN 0.2 05/26/2015 1115   NITRITE NEGATIVE 05/26/2015 1115   LEUKOCYTESUR NEGATIVE 05/26/2015 1115   LEUKOCYTESUR Negative 07/30/2014 2303    Radiological Exams on Admission: No results found.  EKG: Ordered  Assessment/Plan Active Problems:   Diabetic foot infection ([place])   Wound infection  (please populate well all problems here in Problem List. (For example, if patient is on BP meds at home and you resume or decide to hold them, it is a problem that needs to be her. Same for CAD, COPD, HLD and so on)  Hyperkalemia -Hyperkalemic cocktail.  Insulin IV x1, no D50 given his Glu>300 now, Lokelma. EKG ordered and will follow an repeat BMP in 3 hours. -Consult Nephro for emergency HD  IDDM with hyperglycemia -NovoLog 3 units 3 times daily AC plus sliding scale -Patient is noncompliant with calorie intake, request " I will order what I want to eat"  Left BKA wound dehiscence -AKA tomorrow  Narcotic dependence -Treat acute pain, wean down  narcotic afterwards -May need additional meds for phantom pain  Chronic anemia secondary to ESRD -Check iron study -As needed EPO  DVT prophylaxis: Heparin subcu  code Status: Full Code Family  Communication: None at bedside Disposition Plan: Expect more than 2 midnight hospital stay for left AKA and recovery/PT evaluation. Consults called: Vascular surgery, nephrology Admission status: Tele admit   Lequita Halt MD [facility] Pager [phone]  10/14/2020, 1:59 PM

## 2020-10-14 NOTE — Progress Notes (Signed)
40 year old male with dehiscence of his left BKA as previously pictured.  He was scheduled for left AKA today.  Initial potassium was 7 and on recheck 6.9.  I have spoken with the hospitalist for admission and he states he will contact nephrology for dialysis.  Much appreciate the hospitalist service for management of this patient.  He is rescheduled for tomorrow with Dr. Oneida Alar for left AKA.  Please keep him n.p.o. after midnight.  Marty Heck, MD [facility] [place] Office: [place]

## 2020-10-14 NOTE — ED Provider Notes (Addendum)
Patient seen by prior ER physician who ordered work-up and Tests.  Upon my initial evaluation of the patient, he had a very lengthy 5 minutes speech regarding his specific dietary needs.  He states that he has been to this facility several times and should always be allowed to eat "what ever I want."  I assured the patient that I would relay this message to the admitting team but that I would not be ordering any food for him at this time given vascular surgery wants to perform a revision surgery of his leg.  Patient to continue n.p.o. status until reevaluated by vascular surgery.  On my exam otherwise patient appears awake alert in no acute distress, no active bleeding seen from his left lower leg wound.  I was awaiting return of patient's labs.  It appears vascular surgery has taken the patient to the OR.   Luna Fuse, MD 10/14/20 2956    Luna Fuse, MD 10/16/20 [phone]

## 2020-10-14 NOTE — Progress Notes (Addendum)
CRITICAL VALUE ALERT  Critical Value:  Potassium on I-Stat  Date & Time Notied:  10/14/20 1153  Provider Notified: Dr. Tobias [name], [name]   Orders Received/Actions taken: Send newly collected BMET; Patient was placed on telemetry. NSR on monitor.

## 2020-10-14 NOTE — Progress Notes (Signed)
Patient states he will not follow fluid restriction or renal diet while in the hospital. Patient spoke to food service, ordered food, and multiple drinks. Sent admitting MD a message through Epic regarding same.

## 2020-10-14 NOTE — Telephone Encounter (Signed)
Transition Care Management Unsuccessful Follow-up Telephone Call  Date of discharge and from where:  10/11/2020, [facility]  - left AMA  Attempts:   patient is currently admitted to the hospital

## 2020-10-14 NOTE — Progress Notes (Signed)
Communicated with Nephro attending, due to tight scheduling of current HD queue, expect pt will get HD later tonight the earliest, Nephro recommend LoKelma Q8H x2. Ordered and will follow

## 2020-10-14 NOTE — Anesthesia Preprocedure Evaluation (Deleted)
Anesthesia Evaluation  Patient identified by MRN, date of birth, ID band Patient awake    Reviewed: Allergy & Precautions, H&P , NPO status , Patient's Chart, lab work & pertinent test results  History of Anesthesia Complications Negative for: history of anesthetic complications  Airway        Dental   Pulmonary neg pulmonary ROS, Patient abstained from smoking.,           Cardiovascular hypertension,   IMPRESSIONS    1. The left ventricle has normal systolic function with an ejection  fraction of 60-65%. The cavity size was normal. There is mild asymmetric  left ventricular hypertrophy. Left ventricular diastolic Doppler  parameters are consistent with impaired  relaxation. No evidence of left ventricular regional wall motion  abnormalities.  2. The right ventricle has normal systolic function. The cavity was  normal. There is no increase in right ventricular wall thickness.  3. The aortic valve is tricuspid. Mild sclerosis of the aortic valve.  Moderate aortic annular calcification noted. There is a focal calcified  density that appears to emanate from the aortic annulus in the vicinty of  the left and non coronary cusps. This  likely represents degenerative calcific disease but cannot rule out  vegetation. Recommend TEE for further evaluation given MSSA bacteremia.  4. The aorta is normal in size and structure.    Neuro/Psych  Headaches, negative psych ROS   GI/Hepatic negative GI ROS, Neg liver ROS,   Endo/Other  diabetes, Type 1, Insulin Dependent  Renal/GU ESRF and DialysisRenal disease  negative genitourinary   Musculoskeletal  (+) Arthritis , Osteoarthritis,    Abdominal   Peds  Hematology  (+) Blood dyscrasia, anemia ,   Anesthesia Other Findings   Reproductive/Obstetrics negative OB ROS                            Anesthesia Physical  Anesthesia Plan  ASA:  III  Anesthesia Plan: MAC and General   Post-op Pain Management:    Induction: Intravenous  PONV Risk Score and Plan: 1 and Midazolam and Ondansetron  Airway Management Planned: LMA  Additional Equipment:   Intra-op Plan:   Post-operative Plan: Extubation in OR  Informed Consent:   Plan Discussed with: Anesthesiologist  Anesthesia Plan Comments: (Pt due to have dialysis today.  Potassium 6.9-7.  Pt needs dialysis prior to surgery if surgery is not emergency.  Discussed with Dr. Carlis Abbott, case postponed until tomorrow: dialysis today.)       Anesthesia Quick Evaluation

## 2020-10-14 NOTE — Progress Notes (Signed)
Subjective: Noted patient discharged 4 days ago readmitted after fall tore stitches out of left BKA with open wound.  His last hemodialysis was past Friday in the hospital.  On MWF.  With history of noncompliance with diet.  K presenting 6.9 glucose 301 creatinine 9.24 BUN 72 When seen in room he was talking on the phone ordering food from cafeteria did not want to speak to me much.  Objective Vital signs in last 24 hours: Vitals:   10/14/20 0845 10/14/20 0930 10/14/20 1514 10/14/20 1636  BP: (!) 165/114 (!) 137/94 (!) 151/71 107/76  Pulse: 95 93 98 (!) 104  Resp: 14 12 16 18   Temp:   98.1 F (36.7 C) 98.6 F (37 C)  TempSrc:   Oral Oral  SpO2: 99% 97% 96% 93%   Weight change:   Physical Exam: General: Thin build adult male WDWN NAD chronic ill-appearing Heart: RRR, soft 1/6 MURMUR no rub or gallop Lungs: CTA, nonlabored breathing Abdomen: Bowel sounds soft nontender nondistended Extremities: No extremity edema, noted left BKA dressing bloody. Dialysis Access: Right IJ tunneled dialysis catheter  Dialysis Orders: MWF @ [facility] 4h 86min 400/800 67kg TDC Hep 5000 + 5000 midrun - Calcitriol 55mcg PO q HD - No ESA Noted scheduled for R BC AVF on 09/24/20, canceled/delayed per patient request.  Problem/Plan: 1. Hyperkalemia = ongoing issue with diet noncompliance, K6.9 getting IV med meds, Lokelma place on HD when available 2. Left BKA wound dehiscence-noted for AKA tomorrow 3. ESRD -HD MWF-HD today on schedule as above 4. Anemia -Hgb 8.2 give Aranesp 150 q. Fridays as noted last admission 5. Secondary hyperparathyroidism -corrected calcium at goal phosphorus 5.7, continue binders VDR 80 and Sensipar 6. HTN/volume -patient is asking for 6 L UF, currently not in respiratory distress we will try 5-5.5 as BP allows 7. Nutrition albumin 2.5.  Patient refuses renal carb diet always asked for regular 8. Bipolar disorder underlying personality disorder= Meds Per admit 9. Type II  DM-chronically uncontrolled plan Per admit  [name], PA-C [facility] [phone] 10/14/2020,4:54 PM  LOS: 0 days   Labs: Basic Metabolic Panel: Recent Labs  Lab 10/09/20 0304 10/09/20 1311 10/11/20 1230 10/14/20 1153 10/14/20 1209  NA 131*  --  131* 129* 131*  K 6.7*   < > 6.9* 7.0* 6.9*  CL 93*  --  92* 99 95*  CO2 29  --  27  --  22  GLUCOSE 227*  --  162* 286* 301*  BUN 47*  --  63* 82* 72*  CREATININE 6.02*  --  7.50* 9.30* 9.24*  CALCIUM 9.0  --  9.1  --  7.9*  PHOS  --   --  5.7*  --   --    < > = values in this interval not displayed.   Liver Function Tests: Recent Labs  Lab 10/11/20 1230  ALBUMIN 2.5*   No results for input(s): LIPASE, AMYLASE in the last 168 hours. No results for input(s): AMMONIA in the last 168 hours. CBC: Recent Labs  Lab 10/09/20 0304 10/11/20 1403 10/14/20 1020 10/14/20 1153  WBC 7.6 8.0 9.6  --   NEUTROABS  --   --  6.9  --   HGB 7.1* 7.5* 7.6* 8.2*  HCT 25.3* 24.7* 24.3* 24.0*  MCV 100.4* 97.6 96.0  --   PLT 162 242 252  --    Cardiac Enzymes: No results for input(s): CKTOTAL, CKMB, CKMBINDEX, TROPONINI in the last 168 hours. CBG: Recent Labs  Lab 10/11/20 0419 10/11/20 0647 10/11/20 1203 10/14/20 0959 10/14/20 1448  GLUCAP 82 130* 195* 215* 331*    Studies/Results: No results found. Medications: . ceFAZolin     . (feeding supplement) PROSource Plus  30 mL Oral TID BM  . [START ON 10/16/2020] calcitRIOL  2.5 mcg Oral Q M,W,F-HD  . Chlorhexidine Gluconate Cloth  6 each Topical Daily  . cinacalcet  60 mg Oral BID WC  . heparin  5,000 Units Subcutaneous Q12H  . insulin aspart  0-6 Units Subcutaneous TID WC  . insulin aspart  3 Units Subcutaneous TID WC  . midazolam      . sevelamer carbonate  4,000 mg Oral TID WC  . sodium zirconium cyclosilicate  10 g Oral A2P

## 2020-10-14 NOTE — Progress Notes (Signed)
Spoke to Dr. Carlis Abbott regarding potassium level. He request that I speak to admitting MD.

## 2020-10-15 ENCOUNTER — Inpatient Hospital Stay (HOSPITAL_COMMUNITY): Payer: Medicare HMO | Admitting: Anesthesiology

## 2020-10-15 ENCOUNTER — Encounter (HOSPITAL_BASED_OUTPATIENT_CLINIC_OR_DEPARTMENT_OTHER): Payer: Medicare HMO | Admitting: Internal Medicine

## 2020-10-15 ENCOUNTER — Encounter (HOSPITAL_COMMUNITY): Payer: Self-pay | Admitting: Internal Medicine

## 2020-10-15 ENCOUNTER — Encounter (HOSPITAL_COMMUNITY)
Admission: EM | Disposition: A | Discharge status: Home / Self Care | Payer: Self-pay | Source: Home / Self Care | Attending: Internal Medicine

## 2020-10-15 DIAGNOSIS — T8781 Dehiscence of amputation stump: Secondary | ICD-10-CM

## 2020-10-15 DIAGNOSIS — L8990 Pressure ulcer of unspecified site, unspecified stage: Secondary | ICD-10-CM | POA: Insufficient documentation

## 2020-10-15 HISTORY — PX: AMPUTATION: SHX166

## 2020-10-15 LAB — BASIC METABOLIC PANEL
Anion gap: 14 (ref 5–15)
Anion gap: 14 (ref 5–15)
BUN: 77 mg/dL — ABNORMAL HIGH (ref 6–20)
BUN: 35 mg/dL — ABNORMAL HIGH (ref 6–20)
CO2: 25 mmol/L (ref 22–32)
CO2: 23 mmol/L (ref 22–32)
Calcium: 7.7 mg/dL — ABNORMAL LOW (ref 8.9–10.3)
Calcium: 7.8 mg/dL — ABNORMAL LOW (ref 8.9–10.3)
Chloride: 91 mmol/L — ABNORMAL LOW (ref 98–111)
Chloride: 95 mmol/L — ABNORMAL LOW (ref 98–111)
Creatinine, Ser: 9.61 mg/dL — ABNORMAL HIGH (ref 0.61–1.24)
Creatinine, Ser: 5.99 mg/dL — ABNORMAL HIGH (ref 0.61–1.24)
GFR, Estimated: 6 mL/min — ABNORMAL LOW (ref >60)
GFR, Estimated: 11 mL/min — ABNORMAL LOW (ref >60)
Glucose, Bld: 245 mg/dL — ABNORMAL HIGH (ref 70–99)
Glucose, Bld: 371 mg/dL — ABNORMAL HIGH (ref 70–99)
Potassium: 6.8 mmol/L (ref 3.5–5.1)
Potassium: 5 mmol/L (ref 3.5–5.1)
Sodium: 130 mmol/L — ABNORMAL LOW (ref 135–145)
Sodium: 132 mmol/L — ABNORMAL LOW (ref 135–145)

## 2020-10-15 LAB — CBC
HCT: 24.2 % — ABNORMAL LOW (ref 39.0–52.0)
Hemoglobin: 7.1 g/dL — ABNORMAL LOW (ref 13.0–17.0)
MCH: 28.6 pg (ref 26.0–34.0)
MCHC: 29.3 g/dL — ABNORMAL LOW (ref 30.0–36.0)
MCV: 97.6 fL (ref 80.0–100.0)
Platelets: 238 10*3/uL (ref 150–400)
RBC: 2.48 MIL/uL — ABNORMAL LOW (ref 4.22–5.81)
RDW: 15.8 % — ABNORMAL HIGH (ref 11.5–15.5)
WBC: 9.6 10*3/uL (ref 4.0–10.5)
nRBC: 0 % (ref 0.0–0.2)

## 2020-10-15 LAB — GLUCOSE, CAPILLARY
Glucose-Capillary: 338 mg/dL — ABNORMAL HIGH (ref 70–99)
Glucose-Capillary: 309 mg/dL — ABNORMAL HIGH (ref 70–99)
Glucose-Capillary: 217 mg/dL — ABNORMAL HIGH (ref 70–99)
Glucose-Capillary: 175 mg/dL — ABNORMAL HIGH (ref 70–99)
Glucose-Capillary: 269 mg/dL — ABNORMAL HIGH (ref 70–99)
Glucose-Capillary: 211 mg/dL — ABNORMAL HIGH (ref 70–99)

## 2020-10-15 LAB — SURGICAL PCR SCREEN
MRSA, PCR: NEGATIVE
Staphylococcus aureus: NEGATIVE

## 2020-10-15 SURGERY — AMPUTATION, ABOVE KNEE
Anesthesia: General | Site: Knee | Laterality: Left

## 2020-10-15 MED ORDER — NALOXONE HCL 0.4 MG/ML IJ SOLN: 0.4000 mg | INTRAMUSCULAR | Status: DC | PRN

## 2020-10-15 MED ORDER — INSULIN ASPART 100 UNIT/ML ~~LOC~~ SOLN
SUBCUTANEOUS | Status: AC
  Filled 2020-10-15: qty 1

## 2020-10-15 MED ORDER — ONDANSETRON HCL 4 MG/2ML IJ SOLN
INTRAMUSCULAR | Status: DC | PRN
  Administered 2020-10-15: 4 mg via INTRAVENOUS

## 2020-10-15 MED ORDER — CHLORHEXIDINE GLUCONATE 0.12 % MT SOLN
15.0000 mL | Freq: Once | OROMUCOSAL | Status: AC
  Administered 2020-10-15: 15 mL via OROMUCOSAL
  Filled 2020-10-15: qty 15

## 2020-10-15 MED ORDER — ROCURONIUM BROMIDE 10 MG/ML (PF) SYRINGE
PREFILLED_SYRINGE | INTRAVENOUS | Status: DC | PRN
  Administered 2020-10-15: 50 mg via INTRAVENOUS

## 2020-10-15 MED ORDER — MIDAZOLAM HCL 2 MG/2ML IJ SOLN
INTRAMUSCULAR | Status: AC
  Filled 2020-10-15: qty 2

## 2020-10-15 MED ORDER — DIPHENHYDRAMINE HCL 50 MG/ML IJ SOLN
12.5000 mg | Freq: Four times a day (QID) | INTRAMUSCULAR | Status: DC | PRN
  Filled 2020-10-15: qty 1

## 2020-10-15 MED ORDER — LIDOCAINE 2% (20 MG/ML) 5 ML SYRINGE
INTRAMUSCULAR | Status: DC | PRN
  Administered 2020-10-15: 40 mg via INTRAVENOUS

## 2020-10-15 MED ORDER — HEPARIN SODIUM (PORCINE) 1000 UNIT/ML IJ SOLN
INTRAMUSCULAR | Status: AC
  Filled 2020-10-15: qty 4

## 2020-10-15 MED ORDER — FENTANYL CITRATE (PF) 250 MCG/5ML IJ SOLN
INTRAMUSCULAR | Status: AC
  Filled 2020-10-15: qty 5

## 2020-10-15 MED ORDER — MIDAZOLAM HCL 5 MG/5ML IJ SOLN
INTRAMUSCULAR | Status: DC | PRN
  Administered 2020-10-15: 2 mg via INTRAVENOUS

## 2020-10-15 MED ORDER — HYDROMORPHONE HCL 1 MG/ML IJ SOLN: 1.0000 mg | INTRAMUSCULAR | Status: DC | PRN

## 2020-10-15 MED ORDER — ONDANSETRON HCL 4 MG/2ML IJ SOLN
INTRAMUSCULAR | Status: AC
  Filled 2020-10-15: qty 2

## 2020-10-15 MED ORDER — SUCCINYLCHOLINE CHLORIDE 200 MG/10ML IV SOSY
PREFILLED_SYRINGE | INTRAVENOUS | Status: AC
  Filled 2020-10-15: qty 30

## 2020-10-15 MED ORDER — FENTANYL CITRATE (PF) 250 MCG/5ML IJ SOLN
INTRAMUSCULAR | Status: DC | PRN
  Administered 2020-10-15 (×3): 50 ug via INTRAVENOUS

## 2020-10-15 MED ORDER — LIDOCAINE 2% (20 MG/ML) 5 ML SYRINGE
INTRAMUSCULAR | Status: AC
  Filled 2020-10-15: qty 5

## 2020-10-15 MED ORDER — CEFAZOLIN SODIUM-DEXTROSE 2-4 GM/100ML-% IV SOLN
2.0000 g | Freq: Once | INTRAVENOUS | Status: AC
  Administered 2020-10-15: 2 g via INTRAVENOUS

## 2020-10-15 MED ORDER — ORAL CARE MOUTH RINSE: 15.0000 mL | Freq: Once | OROMUCOSAL | Status: AC

## 2020-10-15 MED ORDER — CHLORHEXIDINE GLUCONATE CLOTH 2 % EX PADS: 6.0000 | MEDICATED_PAD | Freq: Every day | CUTANEOUS | Status: DC

## 2020-10-15 MED ORDER — EPHEDRINE 5 MG/ML INJ
INTRAVENOUS | Status: AC
  Filled 2020-10-15: qty 10

## 2020-10-15 MED ORDER — HYDROMORPHONE HCL 1 MG/ML IJ SOLN: 0.5000 mg | INTRAMUSCULAR | Status: DC | PRN

## 2020-10-15 MED ORDER — SODIUM CHLORIDE 0.9 % IV SOLN: INTRAVENOUS | Status: DC

## 2020-10-15 MED ORDER — PHENYLEPHRINE 40 MCG/ML (10ML) SYRINGE FOR IV PUSH (FOR BLOOD PRESSURE SUPPORT)
PREFILLED_SYRINGE | INTRAVENOUS | Status: DC | PRN
  Administered 2020-10-15 (×3): 80 ug via INTRAVENOUS

## 2020-10-15 MED ORDER — HYDROMORPHONE 1 MG/ML IV SOLN
INTRAVENOUS | Status: DC
  Administered 2020-10-15: 30 mg via INTRAVENOUS
  Administered 2020-10-17: 1.4 mg via INTRAVENOUS
  Administered 2020-10-17: 1.2 mg via INTRAVENOUS
  Administered 2020-10-17: 0.8 mg via INTRAVENOUS
  Administered 2020-10-17: 1.2 mg via INTRAVENOUS
  Administered 2020-10-18: 0.8 mg via INTRAVENOUS
  Administered 2020-10-18: 0.6 mg via INTRAVENOUS
  Administered 2020-10-18: 1.4 mg via INTRAVENOUS
  Administered 2020-10-18: 0.8 mg via INTRAVENOUS
  Administered 2020-10-19: 4.5 mg via INTRAVENOUS
  Filled 2020-10-15: qty 30

## 2020-10-15 MED ORDER — FENTANYL CITRATE (PF) 100 MCG/2ML IJ SOLN
25.0000 ug | INTRAMUSCULAR | Status: DC | PRN
Start: 2020-10-15 — End: 2020-10-15
  Administered 2020-10-15 (×2): 50 ug via INTRAVENOUS

## 2020-10-15 MED ORDER — ALBUMIN HUMAN 5 % IV SOLN: INTRAVENOUS | Status: DC | PRN

## 2020-10-15 MED ORDER — SUGAMMADEX SODIUM 200 MG/2ML IV SOLN
INTRAVENOUS | Status: DC | PRN
  Administered 2020-10-15: 160 mg via INTRAVENOUS

## 2020-10-15 MED ORDER — PROPOFOL 10 MG/ML IV BOLUS
INTRAVENOUS | Status: DC | PRN
  Administered 2020-10-15: 130 mg via INTRAVENOUS

## 2020-10-15 MED ORDER — ONDANSETRON HCL 4 MG/2ML IJ SOLN: 4.0000 mg | Freq: Four times a day (QID) | INTRAMUSCULAR | Status: DC | PRN

## 2020-10-15 MED ORDER — SODIUM POLYSTYRENE SULFONATE 15 GM/60ML PO SUSP: 30.0000 g | Freq: Four times a day (QID) | ORAL | Status: DC

## 2020-10-15 MED ORDER — DIPHENHYDRAMINE HCL 12.5 MG/5ML PO ELIX
12.5000 mg | ORAL_SOLUTION | Freq: Four times a day (QID) | ORAL | Status: DC | PRN
  Filled 2020-10-15: qty 5

## 2020-10-15 MED ORDER — CEFAZOLIN SODIUM-DEXTROSE 2-4 GM/100ML-% IV SOLN
INTRAVENOUS | Status: AC
  Filled 2020-10-15: qty 100

## 2020-10-15 MED ORDER — SODIUM CHLORIDE 0.9% FLUSH: 9.0000 mL | INTRAVENOUS | Status: DC | PRN

## 2020-10-15 MED ORDER — INSULIN ASPART 100 UNIT/ML ~~LOC~~ SOLN
6.0000 [IU] | Freq: Once | SUBCUTANEOUS | Status: AC
  Administered 2020-10-15: 6 [IU] via SUBCUTANEOUS
  Filled 2020-10-15: qty 0.06

## 2020-10-15 MED ORDER — FENTANYL CITRATE (PF) 100 MCG/2ML IJ SOLN
INTRAMUSCULAR | Status: AC
  Filled 2020-10-15: qty 2

## 2020-10-15 MED ORDER — 0.9 % SODIUM CHLORIDE (POUR BTL) OPTIME
TOPICAL | Status: DC | PRN
  Administered 2020-10-15: 1000 mL

## 2020-10-15 MED ORDER — SODIUM CHLORIDE 0.9 % IV SOLN: INTRAVENOUS | Status: DC | PRN

## 2020-10-15 MED ORDER — LACTATED RINGERS IV SOLN: INTRAVENOUS | Status: DC

## 2020-10-15 MED ORDER — HYDROMORPHONE HCL 1 MG/ML IJ SOLN
1.0000 mg | INTRAMUSCULAR | Status: DC | PRN
  Administered 2020-10-15 (×4): 1 mg via INTRAVENOUS
  Filled 2020-10-15 (×4): qty 1

## 2020-10-15 SURGICAL SUPPLY — 39 items
BLADE SAW RECIP 87.9 MT (BLADE) ×2 IMPLANT
BNDG COHESIVE 4X5 TAN STRL (GAUZE/BANDAGES/DRESSINGS) ×2 IMPLANT
BNDG COHESIVE 6X5 TAN STRL LF (GAUZE/BANDAGES/DRESSINGS) ×2 IMPLANT
BNDG ELASTIC 6X10 VLCR STRL LF (GAUZE/BANDAGES/DRESSINGS) ×1 IMPLANT
BNDG ELASTIC 6X5.8 VLCR STR LF (GAUZE/BANDAGES/DRESSINGS) ×2 IMPLANT
BNDG GAUZE ELAST 4 BULKY (GAUZE/BANDAGES/DRESSINGS) ×2 IMPLANT
CANISTER SUCT 3000ML PPV (MISCELLANEOUS) ×2 IMPLANT
CLIP VESOCCLUDE MED 6/CT (CLIP) IMPLANT
COVER SURGICAL LIGHT HANDLE (MISCELLANEOUS) ×2 IMPLANT
DRAPE HALF SHEET 40X57 (DRAPES) ×2 IMPLANT
DRAPE ORTHO SPLIT 77X108 STRL (DRAPES) ×4
DRAPE SURG ORHT 6 SPLT 77X108 (DRAPES) ×2 IMPLANT
DRSG ADAPTIC 3X8 NADH LF (GAUZE/BANDAGES/DRESSINGS) ×2 IMPLANT
ELECT CAUTERY BLADE 6.4 (BLADE) ×2 IMPLANT
ELECT REM PT RETURN 9FT ADLT (ELECTROSURGICAL) ×2
ELECTRODE REM PT RTRN 9FT ADLT (ELECTROSURGICAL) ×1 IMPLANT
GAUZE SPONGE 4X4 12PLY STRL (GAUZE/BANDAGES/DRESSINGS) ×2 IMPLANT
GAUZE SPONGE 4X4 12PLY STRL LF (GAUZE/BANDAGES/DRESSINGS) ×1 IMPLANT
GLOVE BIO SURGEON STRL SZ7.5 (GLOVE) ×2 IMPLANT
GOWN STRL REUS W/ TWL LRG LVL3 (GOWN DISPOSABLE) ×3 IMPLANT
GOWN STRL REUS W/TWL LRG LVL3 (GOWN DISPOSABLE) ×6
KIT BASIN OR (CUSTOM PROCEDURE TRAY) ×2 IMPLANT
KIT TURNOVER KIT B (KITS) ×2 IMPLANT
NS IRRIG 1000ML POUR BTL (IV SOLUTION) ×2 IMPLANT
PACK GENERAL/GYN (CUSTOM PROCEDURE TRAY) ×2 IMPLANT
PAD ARMBOARD 7.5X6 YLW CONV (MISCELLANEOUS) ×4 IMPLANT
STAPLER VISISTAT 35W (STAPLE) ×2 IMPLANT
STOCKINETTE IMPERVIOUS LG (DRAPES) ×2 IMPLANT
SUT ETHILON 3 0 PS 1 (SUTURE) IMPLANT
SUT SILK 2 0 SH (SUTURE) IMPLANT
SUT SILK 2 0 SH CR/8 (SUTURE) ×4 IMPLANT
SUT SILK 2 0 TIES 10X30 (SUTURE) ×2 IMPLANT
SUT VIC AB 2-0 CT1 18 (SUTURE) ×1 IMPLANT
SUT VIC AB 2-0 SH 18 (SUTURE) ×4 IMPLANT
SUT VIC AB 3-0 SH 27 (SUTURE) ×2
SUT VIC AB 3-0 SH 27X BRD (SUTURE) ×1 IMPLANT
TOWEL GREEN STERILE (TOWEL DISPOSABLE) ×4 IMPLANT
UNDERPAD 30X36 HEAVY ABSORB (UNDERPADS AND DIAPERS) ×2 IMPLANT
WATER STERILE IRR 1000ML POUR (IV SOLUTION) ×2 IMPLANT

## 2020-10-15 NOTE — Progress Notes (Signed)
Patient has been verbally abusive towards staff.Patient asked for pitcher of ice and one cup of ice given to patient.Patient threw cup of ice in trash.Explained to patient about fluid restrictions and patient yelled ," I am a grown ass man you can't tell me how much to drink.Bring me a pitcher of ice."Patient also upset with pain management regime.Patient expects percocet pill very four hours around the clock and dilaudid every 2 hours around the clock.this nurse explained to patient that pain medication is ordered as needed.To call when pain medication is needed.

## 2020-10-15 NOTE — Interval H&P Note (Signed)
History and Physical Interval Note:  10/15/2020 11:22 AM  [name]  has presented today for surgery, with the diagnosis of Wound Dehiscence.  The various methods of treatment have been discussed with the patient and family. After consideration of risks, benefits and other options for treatment, the patient has consented to  Procedure(s): AMPUTATION ABOVE KNEE (Left) as a surgical intervention.  The patient's history has been reviewed, patient examined, no change in status, stable for surgery.  I have reviewed the patient's chart and labs.  Questions were answered to the patient's satisfaction.     Ruta Hinds

## 2020-10-15 NOTE — Progress Notes (Signed)
[facility] Progress Note   Subjective:   Patient seen and examined at bedside.  Tearful on exam today.  Concerned about upcoming surgery.  Provided support. Discussed importance of following low K diet and consequences of hyperkalemia including death.  Patient reported understanding. Denies CP, SOB, n/v/d, abdominal pain, weakness and fatigue.   Objective Vitals:   10/15/20 0330 10/15/20 0400 10/15/20 0420 10/15/20 0938  BP: (!) 152/30 (!) 122/33 (!) 113/33 (!) 149/54  Pulse: 97 98 96 92  Resp:  18 20 18   Temp:  99 F (37.2 C) 99.6 F (37.6 C) 98.7 F (37.1 C)  TempSrc:  Oral Oral Oral  SpO2:  96% 100% 92%   Physical Exam General:WDWN male in NAD, tearful on exam Heart:RRR,  Lungs:CTAB, nml WOB Abdomen:soft, NTND Extremities:no LE edema, L BKA with stump dressed Dialysis Access: TDC   There were no vitals filed for this visit.  Intake/Output Summary (Last 24 hours) at 10/15/2020 1120 Last data filed at 10/15/2020 0400 Gross per 24 hour  Intake 560 ml  Output 6000 ml  Net -5440 ml    Additional Objective Labs: Basic Metabolic Panel: Recent Labs  Lab 10/11/20 1230 10/14/20 1153 10/14/20 1807 10/15/20 0327 10/15/20 0842  NA 131*   < > 129* 130* 132*  K 6.9*   < > 6.1* 6.8* 5.0  CL 92*   < > 93* 91* 95*  CO2 27   < > 21* 25 23  GLUCOSE 162*   < > 322* 245* 371*  BUN 63*   < > 73* 77* 35*  CREATININE 7.50*   < > 9.33* 9.61* 5.99*  CALCIUM 9.1   < > 8.1* 7.7* 7.8*  PHOS 5.7*  --   --   --   --    < > = values in this interval not displayed.   Liver Function Tests: Recent Labs  Lab 10/11/20 1230  ALBUMIN 2.5*   CBC: Recent Labs  Lab 10/09/20 0304 10/11/20 1403 10/14/20 1020 10/14/20 1153 10/15/20 0327  WBC 7.6 8.0 9.6  --  9.6  NEUTROABS  --   --  6.9  --   --   HGB 7.1* 7.5* 7.6* 8.2* 7.1*  HCT 25.3* 24.7* 24.3* 24.0* 24.2*  MCV 100.4* 97.6 96.0  --  97.6  PLT 162 242 252  --  238   CBG: Recent Labs  Lab 10/14/20 0959  10/14/20 1448 10/14/20 2114 10/15/20 0825 10/15/20 1030  GLUCAP 215* 331* 167* 338* 309*   Iron Studies:  Recent Labs    10/14/20 1807  IRON 20*  TIBC 284  FERRITIN 1,021*   Lab Results  Component Value Date   INR 1.2 07/22/2020   INR 1.0 05/30/2020   INR 1.2 04/24/2019   Studies/Results: No results found.  Medications: . sodium chloride    .  ceFAZolin (ANCEF) IV     . [MAR Hold] (feeding supplement) PROSource Plus  30 mL Oral TID BM  . [MAR Hold] calcitRIOL  2.5 mcg Oral Q M,W,F-HD  . [MAR Hold] Chlorhexidine Gluconate Cloth  6 each Topical Daily  . [MAR Hold] cinacalcet  60 mg Oral BID WC  . [MAR Hold] heparin  5,000 Units Subcutaneous Q12H  . heparin sodium (porcine)      . insulin aspart      . [MAR Hold] insulin aspart  0-6 Units Subcutaneous TID WC  . [MAR Hold] insulin aspart  3 Units Subcutaneous TID WC  . [MAR Hold] sevelamer carbonate  4,000 mg Oral TID WC    Dialysis Orders: MWF @ [facility] 4h 20min 400/800 67kg TDC Hep 5000 + 5000 midrun - Calcitriol 109mcg PO q HD - No ESA Noted scheduled for R BC AVF on 09/24/20, canceled/delayed per patient request.  Problem/Plan: 1. Hyperkalemia - ongoing issue with diet noncompliance, K5.0 this AM. Lokelma ordered daily.  Provided education about low K diet and importance of following.  Patient acknowledged.  2. Left BKA wound dehiscence- To have AKA completed today.  3. ESRD -HD MWF- HD overnight per regular schedule.  Next HD tomorrow.  4. Anemia -Hgb 7.1 today. give Aranesp 150 qFridays.  Expect drop post surgery. Transfuse prn.  5. Secondary hyperparathyroidism -corrected calcium at goal phosphorus 5.7, continue binders, VDRA and sensipar 6. HTN/volume - BP improved with HD.  UF 6L removed last night.  Does not appear grossly volume overloaded.  Plan for UF as tolerated.  1558mL fluid restriction.  7. Nutrition albumin 2.5.  Patient refuses renal carb diet always asked for regular.  Provided education about  following renal diet. Currently NPO.  8. Bipolar disorder underlying personality disorder= Meds Per admit 9. Type II DM - chronically uncontrolled plan Per admit   Jen Mow, PA-C [facility] 10/15/2020,11:20 AM  LOS: 1 day

## 2020-10-15 NOTE — Progress Notes (Signed)
Pt MEWS is yellow, pt is in surgery.

## 2020-10-15 NOTE — Transfer of Care (Signed)
Immediate Anesthesia Transfer of Care Note  Patient: [name]  Procedure(s) Performed: AMPUTATION ABOVE KNEE (Left Knee)  Patient Location: PACU  Anesthesia Type:General  Level of Consciousness: awake and drowsy  Airway & Oxygen Therapy: Patient Spontanous Breathing  Post-op Assessment: Report given to RN and Post -op Vital signs reviewed and stable  Post vital signs: Reviewed and stable  Last Vitals:  Vitals Value Taken Time  BP 123/71 10/15/20 1306  Temp    Pulse 95 10/15/20 1306  Resp 19 10/15/20 1306  SpO2 98 % 10/15/20 1306  Vitals shown include unvalidated device data.  Last Pain:  Vitals:   10/15/20 0938  TempSrc: Oral  PainSc:       Patients Stated Pain Goal: 0 (21/74/71 5953)  Complications: No complications documented.

## 2020-10-15 NOTE — Progress Notes (Signed)
PROGRESS NOTE    [name]  PXT:[id number] DOB: 12/06/1980 DOA: 10/13/2020 PCP: Kerin Perna, NP   Brief Narrative: 40 year old with past medical history significant for recent left BKA after failed treatment of osteomyelitis, ESRD on hemodialysis M WF, hypertension, medical noncompliance, narcotic dependence, presents with dehiscence of his left BKA.  He underwent left BKA on 08/2020 for left foot and ankle osteomyelitis.  Had a long hospital stay, when he decided to leave AMA  on 1/15.   He now presents with dehiscence of his left BKA after a fall.  He has missed hemodialysis.  Presented with hyperkalemia.  Patient was evaluated by vascular and plan is for AKA on 1/18.      Assessment & Plan:   Active Problems:   Diabetic foot infection ([place])   Wound infection   Wound dehiscence   Pressure injury of skin   1-Left BKA wound dehiscence: -Fell multiples times at home. Presented with wound dehsicence.  -Underwent AKA 1/18. -Pain management per vascular.   2-Hyperkalemia: Had DH today.  -not complaint with renal diet.  -Might need schedule lokelma.    3-ESRD; ON HD (MWF) Appreciate Nephrology assistance. \Had HD today.   4-ID Diabetes:  Continue with 3 units novoog with meals and  SSI>   5-Narcotic Dependence:  Currently on pain medication post surgery.   6-Chronic anemia secondary to ESRD;  Hb range 7--8.  Repeat hb in am post op.   7-Hyponatremia; in setting renal failure. Correct with HD.   Pressure Injury 10/14/20 Wrist Left;Anterior Stage 2 -  Partial thickness loss of dermis presenting as a shallow open injury with a red, pink wound bed without slough. (Active)  10/14/20 2100  Location: Wrist  Location Orientation: Left;Anterior  Staging: Stage 2 -  Partial thickness loss of dermis presenting as a shallow open injury with a red, pink wound bed without slough.  Wound Description (Comments):   Present on Admission: Yes     Estimated body mass  index is 24.52 kg/m as calculated from the following:   Height as of 09/10/20: 5\' 7"  (1.702 m).   Weight as of 10/11/20: 71 kg.   DVT prophylaxis: Heparin Code Status: Full code Family Communication: care discussed with patient, Mom was present at bedside.  Disposition Plan:  Status is: Inpatient  Remains inpatient appropriate because:Hemodynamically unstable   Dispo: The patient is from: Home              Anticipated d/c is to: to be determine              Anticipated d/c date is: 2 days              Patient currently is not medically stable to d/c.        Consultants:   Vascular.   Procedures:   None  Antimicrobials:    Subjective: He is alert, awaiting to go to Surgery.  He would like to remain on regular diet. He understand risk for Arrhythmia in event of hyperkalemia.   Objective: Vitals:   10/15/20 0300 10/15/20 0330 10/15/20 0400 10/15/20 0420  BP: (!) 120/44 (!) 152/30 (!) 122/33 (!) 113/33  Pulse: 98 97 98 96  Resp:   18 20  Temp:   99 F (37.2 C) 99.6 F (37.6 C)  TempSrc:   Oral Oral  SpO2:   96% 100%    Intake/Output Summary (Last 24 hours) at 10/15/2020 0657 Last data filed at 10/15/2020 0400 Gross per 24 hour  Intake 560 ml  Output 6000 ml  Net -5440 ml   There were no vitals filed for this visit.  Examination:  General exam: Appears calm and comfortable  Respiratory system: Clear to auscultation. Respiratory effort normal. Cardiovascular system: S1 & S2 heard, RRR. No JVD, murmurs, rubs, gallops or clicks. No pedal edema. Gastrointestinal system: Abdomen is nondistended, soft and nontender. No organomegaly or masses felt. Normal bowel sounds heard. Central nervous system: Alert and oriented.  Extremities: open wound left BKA   Data Reviewed: I have personally reviewed following labs and imaging studies  CBC: Recent Labs  Lab 10/09/20 0304 10/11/20 1403 10/14/20 1020 10/14/20 1153 10/15/20 0327  WBC 7.6 8.0 9.6  --  9.6   NEUTROABS  --   --  6.9  --   --   HGB 7.1* 7.5* 7.6* 8.2* 7.1*  HCT 25.3* 24.7* 24.3* 24.0* 24.2*  MCV 100.4* 97.6 96.0  --  97.6  PLT 162 242 252  --  824   Basic Metabolic Panel: Recent Labs  Lab 10/09/20 0304 10/09/20 1311 10/11/20 1230 10/14/20 1153 10/14/20 1209 10/14/20 1807 10/15/20 0327  NA 131*  --  131* 129* 131* 129* 130*  K 6.7*   < > 6.9* 7.0* 6.9* 6.1* 6.8*  CL 93*  --  92* 99 95* 93* 91*  CO2 29  --  27  --  22 21* 25  GLUCOSE 227*  --  162* 286* 301* 322* 245*  BUN 47*  --  63* 82* 72* 73* 77*  CREATININE 6.02*  --  7.50* 9.30* 9.24* 9.33* 9.61*  CALCIUM 9.0  --  9.1  --  7.9* 8.1* 7.7*  PHOS  --   --  5.7*  --   --   --   --    < > = values in this interval not displayed.   GFR: Estimated Creatinine Clearance: 9.5 mL/min (A) (by C-G formula based on SCr of 9.61 mg/dL (H)). Liver Function Tests: Recent Labs  Lab 10/11/20 1230  ALBUMIN 2.5*   No results for input(s): LIPASE, AMYLASE in the last 168 hours. No results for input(s): AMMONIA in the last 168 hours. Coagulation Profile: No results for input(s): INR, PROTIME in the last 168 hours. Cardiac Enzymes: No results for input(s): CKTOTAL, CKMB, CKMBINDEX, TROPONINI in the last 168 hours. BNP (last 3 results) No results for input(s): PROBNP in the last 8760 hours. HbA1C: No results for input(s): HGBA1C in the last 72 hours. CBG: Recent Labs  Lab 10/11/20 0647 10/11/20 1203 10/14/20 0959 10/14/20 1448 10/14/20 2114  GLUCAP 130* 195* 215* 331* 167*   Lipid Profile: No results for input(s): CHOL, HDL, LDLCALC, TRIG, CHOLHDL, LDLDIRECT in the last 72 hours. Thyroid Function Tests: No results for input(s): TSH, T4TOTAL, FREET4, T3FREE, THYROIDAB in the last 72 hours. Anemia Panel: Recent Labs    10/14/20 1807  FERRITIN 1,021*  TIBC 284  IRON 20*   Sepsis Labs: No results for input(s): PROCALCITON, LATICACIDVEN in the last 168 hours.  Recent Results (from the past 240 hour(s))  Resp  Panel by RT-PCR (Flu A&B, Covid) Nasopharyngeal Swab     Status: None   Collection Time: 10/14/20  8:37 AM   Specimen: Nasopharyngeal Swab; Nasopharyngeal(NP) swabs in vial transport medium  Result Value Ref Range Status   SARS Coronavirus 2 by RT PCR NEGATIVE NEGATIVE Final    Comment: (NOTE) SARS-CoV-2 target nucleic acids are NOT DETECTED.  The SARS-CoV-2 RNA is generally detectable in upper respiratory  specimens during the acute phase of infection. The lowest concentration of SARS-CoV-2 viral copies this assay can detect is 138 copies/mL. A negative result does not preclude SARS-Cov-2 infection and should not be used as the sole basis for treatment or other patient management decisions. A negative result may occur with  improper specimen collection/handling, submission of specimen other than nasopharyngeal swab, presence of viral mutation(s) within the areas targeted by this assay, and inadequate number of viral copies(<138 copies/mL). A negative result must be combined with clinical observations, patient history, and epidemiological information. The expected result is Negative.  Fact Sheet for Patients:  EntrepreneurPulse.com.au  Fact Sheet for Healthcare Providers:  IncredibleEmployment.be  This test is no t yet approved or cleared by the [place] FDA and  has been authorized for detection and/or diagnosis of SARS-CoV-2 by FDA under an Emergency Use Authorization (EUA). This EUA will remain  in effect (meaning this test can be used) for the duration of the COVID-19 declaration under Section 564(b)(1) of the Act, 21 U.S.C.section 360bbb-3(b)(1), unless the authorization is terminated  or revoked sooner.       Influenza A by PCR NEGATIVE NEGATIVE Final   Influenza B by PCR NEGATIVE NEGATIVE Final    Comment: (NOTE) The Xpert Xpress SARS-CoV-2/FLU/RSV plus assay is intended as an aid in the diagnosis of influenza from Nasopharyngeal  swab specimens and should not be used as a sole basis for treatment. Nasal washings and aspirates are unacceptable for Xpert Xpress SARS-CoV-2/FLU/RSV testing.  Fact Sheet for Patients: EntrepreneurPulse.com.au  Fact Sheet for Healthcare Providers: IncredibleEmployment.be  This test is not yet approved or cleared by the [place] FDA and has been authorized for detection and/or diagnosis of SARS-CoV-2 by FDA under an Emergency Use Authorization (EUA). This EUA will remain in effect (meaning this test can be used) for the duration of the COVID-19 declaration under Section 564(b)(1) of the Act, 21 U.S.C. section 360bbb-3(b)(1), unless the authorization is terminated or revoked.  Performed at [facility], [place] [address], [place], [place] [address]   Surgical pcr screen     Status: None   Collection Time: 10/15/20  4:36 AM   Specimen: Nasal Mucosa; Nasal Swab  Result Value Ref Range Status   MRSA, PCR NEGATIVE NEGATIVE Final   Staphylococcus aureus NEGATIVE NEGATIVE Final    Comment: (NOTE) The Xpert SA Assay (FDA approved for NASAL specimens in patients 33 years of age and older), is one component of a comprehensive surveillance program. It is not intended to diagnose infection nor to guide or monitor treatment. Performed at [facility], [place] [address] [place], [place] [address]          Radiology Studies: No results found.      Scheduled Meds: . (feeding supplement) PROSource Plus  30 mL Oral TID BM  . [START ON 10/16/2020] calcitRIOL  2.5 mcg Oral Q M,W,F-HD  . Chlorhexidine Gluconate Cloth  6 each Topical Daily  . cinacalcet  60 mg Oral BID WC  . heparin  5,000 Units Subcutaneous Q12H  . heparin sodium (porcine)      . insulin aspart  0-6 Units Subcutaneous TID WC  . insulin aspart  3 Units Subcutaneous TID WC  . sevelamer carbonate  4,000 mg Oral TID WC   Continuous Infusions:   LOS: 1 day    Time  spent: 35 minutes.     Elmarie Shiley, MD [facility]   If 7PM-7AM, please contact night-coverage [email or web address]  10/15/2020,  6:57 AM

## 2020-10-15 NOTE — Progress Notes (Signed)
PT Cancellation Note  Patient Details Name: [name] MRN: [id number] DOB: 1980/11/22   Cancelled Treatment:    Reason Eval/Treat Not Completed: Patient at procedure or test/unavailable pt off floor in OR for AKA, will follow up post op.   [name] 10/15/2020, 11:32 AM Marisa Severin, PT, DPT Acute Rehabilitation Services Pager [phone] Office [phone]

## 2020-10-15 NOTE — Progress Notes (Signed)
Pt refuse vital signs when returning to the floor.

## 2020-10-15 NOTE — Anesthesia Preprocedure Evaluation (Addendum)
Anesthesia Evaluation  Patient identified by MRN, date of birth, ID band Patient awake    Reviewed: Allergy & Precautions, NPO status , Patient's Chart, lab work & pertinent test results  History of Anesthesia Complications Negative for: history of anesthetic complications  Airway Mallampati: II  TM Distance: >3 FB Neck ROM: Full    Dental no notable dental hx.    Pulmonary neg pulmonary ROS, Patient abstained from smoking.,    Pulmonary exam normal        Cardiovascular hypertension, negative cardio ROS Normal cardiovascular exam     Neuro/Psych negative neurological ROS  negative psych ROS   GI/Hepatic negative GI ROS, Neg liver ROS,   Endo/Other  diabetes, Type 2  Renal/GU Dialysis and ESRFRenal disease (K 5.0)  negative genitourinary   Musculoskeletal  (+) Arthritis ,   Abdominal   Peds  Hematology  (+) anemia , Hgb 7.1   Anesthesia Other Findings Day of surgery medications reviewed with patient.  Reproductive/Obstetrics negative OB ROS                           Anesthesia Physical Anesthesia Plan  ASA: III  Anesthesia Plan: General   Post-op Pain Management:    Induction: Intravenous  PONV Risk Score and Plan: 3 and Treatment may vary due to age or medical condition, Ondansetron and Midazolam  Airway Management Planned: LMA  Additional Equipment: None  Intra-op Plan:   Post-operative Plan: Extubation in OR  Informed Consent: I have reviewed the patients History and Physical, chart, labs and discussed the procedure including the risks, benefits and alternatives for the proposed anesthesia with the patient or authorized representative who has indicated his/her understanding and acceptance.     Dental advisory given  Plan Discussed with: CRNA  Anesthesia Plan Comments:         Anesthesia Quick Evaluation

## 2020-10-15 NOTE — Op Note (Signed)
[facility] OPERATIVE NOTE   Procedure: Left above knee amputation  Surgeon(s): Elam Dutch, MD  ASSISTANT: Gerri Lins, PA-C for exposure and expediting procedure  Anesthesia: General  Specimens: Right leg  PROCEDURE DETAIL: After obtaining informed consent, the patient was taken to the operating room. The patient was placed in supine position the operating room table. After induction of general anesthesia and endotracheal intubation the patient's Foley catheter was placed. Next patient's entire left lower extremity was prepped and draped in usual sterile fashion. A circumferential incision was made on the left leg just above the knee. The incision was carried down into the sucutaneous tissues down to level the saphenous vein. This was ligated and divided between silk ties. Soft tissues were taken down as well as the muscle and fascia with cautery. The superficial femoral artery and vein were dissected free circumferentially clamped and divided. These were suture ligated proximally. Remainder of the soft tissues were taken down with cautery. The periosteum was raised on the femur approximately 5 cm above the skin edge. The femur was divided at this level. The leg was passed off the table as a specimen. Hemostasis was obtained. The wound was thoroughly irrigated with normal saline solution. The fascial edges were reapproximated using interrupted 2 0 Vicryl sutures. The subcutaneous tissues reapproximated using a running 3-0 Vicryl suture. The skin was closed staples. Patient tolerated procedure well and there were no complications. Instrument sponge and needle counts correct in the case. Patient was taken to recovery in stable condition.  Ruta Hinds, MD [facility] Office: [phone] Pager: [phone]

## 2020-10-15 NOTE — Anesthesia Postprocedure Evaluation (Signed)
Anesthesia Post Note  Patient: [name]  Procedure(s) Performed: AMPUTATION ABOVE KNEE (Left Knee)     Patient location during evaluation: PACU Anesthesia Type: General Level of consciousness: awake and alert and oriented Pain management: pain level controlled Vital Signs Assessment: post-procedure vital signs reviewed and stable Respiratory status: spontaneous breathing, nonlabored ventilation and respiratory function stable Cardiovascular status: blood pressure returned to baseline Postop Assessment: no apparent nausea or vomiting Anesthetic complications: no   No complications documented.  Last Vitals:  Vitals:   10/15/20 1310 10/15/20 1321  BP: 123/71 115/86  Pulse: 90 90  Resp: 16 11  Temp: 36.6 C   SpO2: 100% 100%                Brennan Bailey

## 2020-10-15 NOTE — OR Nursing (Signed)
Pt had phone in bed in OR, phone placed in pt chart. No damages to phone. PACU RN notified on report

## 2020-10-15 NOTE — Progress Notes (Signed)
Patient off the floor for surgery.

## 2020-10-15 NOTE — Anesthesia Procedure Notes (Signed)
Procedure Name: Intubation Date/Time: 10/15/2020 11:49 AM Performed by: Oletta Lamas, CRNA Pre-anesthesia Checklist: Patient identified, Emergency Drugs available, Suction available and Patient being monitored Patient Re-evaluated:Patient Re-evaluated prior to induction Oxygen Delivery Method: Circle System Utilized Preoxygenation: Pre-oxygenation with 100% oxygen Induction Type: IV induction Ventilation: Mask ventilation without difficulty Laryngoscope Size: Miller and 2 Grade View: Grade I Tube type: Oral Number of attempts: 1 Airway Equipment and Method: Stylet and Oral airway Placement Confirmation: ETT inserted through vocal cords under direct vision,  positive ETCO2 and breath sounds checked- equal and bilateral Secured at: 22 cm Tube secured with: Tape Dental Injury: Teeth and Oropharynx as per pre-operative assessment

## 2020-10-16 ENCOUNTER — Encounter (HOSPITAL_COMMUNITY): Payer: Self-pay | Admitting: Vascular Surgery

## 2020-10-16 DIAGNOSIS — R69 Illness, unspecified: Secondary | ICD-10-CM

## 2020-10-16 LAB — GLUCOSE, CAPILLARY
Glucose-Capillary: 126 mg/dL — ABNORMAL HIGH (ref 70–99)
Glucose-Capillary: 158 mg/dL — ABNORMAL HIGH (ref 70–99)
Glucose-Capillary: 109 mg/dL — ABNORMAL HIGH (ref 70–99)
Glucose-Capillary: 157 mg/dL — ABNORMAL HIGH (ref 70–99)
Glucose-Capillary: 275 mg/dL — ABNORMAL HIGH (ref 70–99)
Glucose-Capillary: 261 mg/dL — ABNORMAL HIGH (ref 70–99)

## 2020-10-16 LAB — CBC
HCT: 22.1 % — ABNORMAL LOW (ref 39.0–52.0)
Hemoglobin: 6.6 g/dL — CL (ref 13.0–17.0)
MCH: 29.1 pg (ref 26.0–34.0)
MCHC: 29.9 g/dL — ABNORMAL LOW (ref 30.0–36.0)
MCV: 97.4 fL (ref 80.0–100.0)
Platelets: 196 10*3/uL (ref 150–400)
RBC: 2.27 MIL/uL — ABNORMAL LOW (ref 4.22–5.81)
RDW: 15.9 % — ABNORMAL HIGH (ref 11.5–15.5)
WBC: 10.2 10*3/uL (ref 4.0–10.5)
nRBC: 0 % (ref 0.0–0.2)

## 2020-10-16 LAB — BASIC METABOLIC PANEL
Anion gap: 15 (ref 5–15)
BUN: 44 mg/dL — ABNORMAL HIGH (ref 6–20)
CO2: 23 mmol/L (ref 22–32)
Calcium: 7.4 mg/dL — ABNORMAL LOW (ref 8.9–10.3)
Chloride: 96 mmol/L — ABNORMAL LOW (ref 98–111)
Creatinine, Ser: 7.26 mg/dL — ABNORMAL HIGH (ref 0.61–1.24)
GFR, Estimated: 9 mL/min — ABNORMAL LOW (ref >60)
Glucose, Bld: 125 mg/dL — ABNORMAL HIGH (ref 70–99)
Potassium: 6 mmol/L — ABNORMAL HIGH (ref 3.5–5.1)
Sodium: 134 mmol/L — ABNORMAL LOW (ref 135–145)

## 2020-10-16 LAB — PREPARE RBC (CROSSMATCH)

## 2020-10-16 LAB — HEMOGLOBIN AND HEMATOCRIT, BLOOD
HCT: 23.6 % — ABNORMAL LOW (ref 39.0–52.0)
Hemoglobin: 7.4 g/dL — ABNORMAL LOW (ref 13.0–17.0)

## 2020-10-16 MED ORDER — SODIUM ZIRCONIUM CYCLOSILICATE 5 G PO PACK
5.0000 g | PACK | Freq: Every day | ORAL | Status: DC
  Administered 2020-10-16 – 2020-10-17 (×2): 5 g via ORAL
  Filled 2020-10-16 (×2): qty 1

## 2020-10-16 MED ORDER — SENNOSIDES-DOCUSATE SODIUM 8.6-50 MG PO TABS
1.0000 | ORAL_TABLET | Freq: Two times a day (BID) | ORAL | Status: DC
  Administered 2020-10-19 – 2020-10-21 (×2): 1 via ORAL
  Filled 2020-10-16 (×5): qty 1

## 2020-10-16 MED ORDER — SODIUM CHLORIDE 0.9 % IV SOLN
125.0000 mg | INTRAVENOUS | Status: DC
  Administered 2020-10-19: 125 mg via INTRAVENOUS
  Filled 2020-10-16 (×4): qty 10

## 2020-10-16 MED ORDER — SODIUM CHLORIDE 0.9% IV SOLUTION: Freq: Once | INTRAVENOUS | Status: DC

## 2020-10-16 MED ORDER — CALCITRIOL 0.5 MCG PO CAPS
ORAL_CAPSULE | ORAL | Status: AC
  Filled 2020-10-16: qty 5

## 2020-10-16 MED ORDER — DARBEPOETIN ALFA 150 MCG/0.3ML IJ SOSY
150.0000 ug | PREFILLED_SYRINGE | INTRAMUSCULAR | Status: DC
  Administered 2020-10-19: 150 ug via INTRAVENOUS
  Filled 2020-10-16 (×2): qty 0.3

## 2020-10-16 MED ORDER — POLYETHYLENE GLYCOL 3350 17 G PO PACK
17.0000 g | PACK | Freq: Every day | ORAL | Status: DC
  Filled 2020-10-16 (×2): qty 1

## 2020-10-16 NOTE — Progress Notes (Signed)
PT Cancellation Note  Patient Details Name: [name] MRN: [id number] DOB: 01-03-1981   Cancelled Treatment:    Reason Eval/Treat Not Completed: Medical issues which prohibited therapy Hgb 6.6 and has still not received blood yet, patient also very irritable this afternoon per RN. Will continue efforts on next date of service.    Windell Norfolk, DPT, PN1   Supplemental Physical Therapist [facility]    Pager [phone] Acute Rehab Office [phone]

## 2020-10-16 NOTE — Progress Notes (Signed)
  Progress Note    10/16/2020 7:27 AM 1 Day Post-Op  Subjective:  Using PCA, but complainaing of significant pain   Vitals:   10/16/20 0246 10/16/20 0438  BP: 140/69 138/69  Pulse: (!) 101 95  Resp: 14 20  Temp: 98.4 F (36.9 C) 99.4 F (37.4 C)  SpO2: 100% 99%    Physical Exam: Cardiac:  RRR Lungs:  nonlabored Extremities: left AKA surgical dressing is dry and intact  CBC    Component Value Date/Time   WBC 10.2 10/16/2020 0256   RBC 2.27 (L) 10/16/2020 0256   HGB 6.6 (LL) 10/16/2020 0256   HGB 9.7 (L) 08/22/2014 0447   HCT 22.1 (L) 10/16/2020 0256   HCT 30.0 (L) 08/22/2014 0447   PLT 196 10/16/2020 0256   PLT 158 08/22/2014 0447   MCV 97.4 10/16/2020 0256   MCV 97 08/22/2014 0447   MCH 29.1 10/16/2020 0256   MCHC 29.9 (L) 10/16/2020 0256   RDW 15.9 (H) 10/16/2020 0256   RDW 15.3 (H) 08/22/2014 0447   LYMPHSABS 1.3 10/14/2020 1020   LYMPHSABS 1.7 08/22/2014 0447   MONOABS 0.7 10/14/2020 1020   MONOABS 0.8 08/22/2014 0447   EOSABS 0.5 10/14/2020 1020   EOSABS 0.4 08/22/2014 0447   BASOSABS 0.1 10/14/2020 1020   BASOSABS 0.1 08/22/2014 0447    BMET    Component Value Date/Time   NA 134 (L) 10/16/2020 0256   NA 138 08/22/2014 0447   K 6.0 (H) 10/16/2020 0256   K 4.0 08/22/2014 0447   CL 96 (L) 10/16/2020 0256   CL 100 08/22/2014 0447   CO2 23 10/16/2020 0256   CO2 30 08/22/2014 0447   GLUCOSE 125 (H) 10/16/2020 0256   GLUCOSE 88 08/22/2014 0447   BUN 44 (H) 10/16/2020 0256   BUN 14 08/22/2014 0447   CREATININE 7.26 (H) 10/16/2020 0256   CREATININE 5.00 (H) 08/22/2014 0447   CALCIUM 7.4 (L) 10/16/2020 0256   CALCIUM 8.2 (L) 08/22/2014 0447   GFRNONAA 9 (L) 10/16/2020 0256   GFRNONAA 14 (L) 08/22/2014 0447   GFRNONAA 5 (L) 06/19/2014 0937   GFRAA 7 (L) 05/30/2020 1923   GFRAA 17 (L) 08/22/2014 0447   GFRAA 6 (L) 06/19/2014 0937     Intake/Output Summary (Last 24 hours) at 10/16/2020 0727 Last data filed at 10/16/2020 0200 Gross per 24 hour   Intake 2560 ml  Output 75 ml  Net 2485 ml    HOSPITAL MEDICATIONS Scheduled Meds: . (feeding supplement) PROSource Plus  30 mL Oral TID BM  . sodium chloride   Intravenous Once  . calcitRIOL  2.5 mcg Oral Q M,W,F-HD  . Chlorhexidine Gluconate Cloth  6 each Topical Daily  . Chlorhexidine Gluconate Cloth  6 each Topical Q0600  . cinacalcet  60 mg Oral BID WC  . heparin  5,000 Units Subcutaneous Q12H  . HYDROmorphone   Intravenous Q4H  . insulin aspart  0-6 Units Subcutaneous TID WC  . insulin aspart  3 Units Subcutaneous TID WC  . sevelamer carbonate  4,000 mg Oral TID WC   Continuous Infusions: PRN Meds:.albuterol, diphenhydrAMINE **OR** diphenhydrAMINE, naloxone **AND** sodium chloride flush, ondansetron (ZOFRAN) IV, sevelamer carbonate  Assessment: POD 1 left AKA. Tm 99.6; VSS. He is awake and alert in NAD Anemia: multifactorial/ABL     Plan: -To get one unit prbc during HD today -Take down dressing tomorrow   Risa Grill, PA-C [facility] [phone] 10/16/2020  7:27 AM

## 2020-10-16 NOTE — TOC Benefit Eligibility Note (Signed)
Transition of Care [facility]) Benefit Eligibility Note    Patient Details  Name: [name] MRN: [id number] Date of Birth: Mar 02, 1981   Medication/Dose: Elaina Pattee ZIRCONIUM CYCLOSILICATE [facility] )   PACKET 5 G DAILY  Covered?: Yes     Prescription Coverage Preferred Pharmacy: [facility] [place] with Person/Company/Phone Number:: PETER  @ [employer] # [phone]  Co-Pay: Johnsie Kindred  Prior Approval: No  Deductible: Unmet  Additional Notes: SECONDARY INS: MEDICAID   ACCESS   EFF-Dte : 09-28-2017  CO-PAY- $4.00    Memory Argue Phone Number: 10/16/2020, 12:13 PM

## 2020-10-16 NOTE — Progress Notes (Signed)
HOSPITAL MEDICINE OVERNIGHT EVENT NOTE    Notified by nursing that patient's hemoglobin is 6.6 this morning.  Patient is hemodynamically stable.  Patient denies shortness of breath and patient is not hypoxic.  Patient underwent left AKA yesterday.  Left lower extremity stump dressing is apparently clean dry and intact however.  Will order 1 unit packed red blood cell transfusion to be performed with dialysis this morning.  Vernelle Emerald  MD [facility]

## 2020-10-16 NOTE — Progress Notes (Signed)
[facility] Progress Note   Subjective:   Patient seen and examined at bedside in dialysis.  Reports stump pain.  Denies CP, SOB, n/v/d, abdominal pain, weakness and fatigue.  Hgb dropped post surgery, to get 1 unit pRBC today.   Objective Vitals:   10/16/20 1019 10/16/20 1030 10/16/20 1100 10/16/20 1130  BP: 135/86 135/86 (!) 146/67 (!) 141/65  Pulse: 100 100    Resp:  14 12 12   Temp:      TempSrc:      SpO2:      Weight:      Height:       Physical Exam General:WDWN male in NAD Heart:RRR Lungs:CTAB, nml WOB Abdomen:soft, NTND Extremities:no LE edema, L AKA with stump dressed Dialysis Access: TDC in use   Filed Weights   10/16/20 0500 10/16/20 1014  Weight: 74.9 kg 76.1 kg    Intake/Output Summary (Last 24 hours) at 10/16/2020 1157 Last data filed at 10/16/2020 0959 Gross per 24 hour  Intake 3035 ml  Output 75 ml  Net 2960 ml    Additional Objective Labs: Basic Metabolic Panel: Recent Labs  Lab 10/11/20 1230 10/14/20 1153 10/15/20 0327 10/15/20 0842 10/16/20 0256  NA 131*   < > 130* 132* 134*  K 6.9*   < > 6.8* 5.0 6.0*  CL 92*   < > 91* 95* 96*  CO2 27   < > 25 23 23   GLUCOSE 162*   < > 245* 371* 125*  BUN 63*   < > 77* 35* 44*  CREATININE 7.50*   < > 9.61* 5.99* 7.26*  CALCIUM 9.1   < > 7.7* 7.8* 7.4*  PHOS 5.7*  --   --   --   --    < > = values in this interval not displayed.   Liver Function Tests: Recent Labs  Lab 10/11/20 1230  ALBUMIN 2.5*   CBC: Recent Labs  Lab 10/11/20 1403 10/14/20 1020 10/14/20 1153 10/15/20 0327 10/16/20 0256  WBC 8.0 9.6  --  9.6 10.2  NEUTROABS  --  6.9  --   --   --   HGB 7.5* 7.6* 8.2* 7.1* 6.6*  HCT 24.7* 24.3* 24.0* 24.2* 22.1*  MCV 97.6 96.0  --  97.6 97.4  PLT 242 252  --  238 196   CBG: Recent Labs  Lab 10/15/20 1656 10/15/20 2149 10/16/20 0249 10/16/20 0647 10/16/20 0820  GLUCAP 269* 211* 126* 158* 109*   Iron Studies:  Recent Labs    10/14/20 1807  IRON 20*  TIBC  284  FERRITIN 1,021*    Medications:  . (feeding supplement) PROSource Plus  30 mL Oral TID BM  . sodium chloride   Intravenous Once  . calcitRIOL  2.5 mcg Oral Q M,W,F-HD  . Chlorhexidine Gluconate Cloth  6 each Topical Daily  . Chlorhexidine Gluconate Cloth  6 each Topical Q0600  . cinacalcet  60 mg Oral BID WC  . heparin  5,000 Units Subcutaneous Q12H  . HYDROmorphone   Intravenous Q4H  . insulin aspart  0-6 Units Subcutaneous TID WC  . insulin aspart  3 Units Subcutaneous TID WC  . polyethylene glycol  17 g Oral Daily  . senna-docusate  1 tablet Oral BID  . sevelamer carbonate  4,000 mg Oral TID WC  . sodium zirconium cyclosilicate  5 g Oral Daily    Dialysis Orders: MWF @ [facility] 4h 40min 400/800 67kg TDC Hep 5000 + 5000 midrun - Calcitriol 45mcg  PO q HD - No ESA Noted scheduled for R BC AVF on 09/24/20, canceled/delayed per patient request.  Problem/Plan: 1. Hyperkalemia-ongoing issue with diet noncompliance, K 6.0 this AM, using low K bath. Lokelma ordered daily.  Provided education about low K diet and importance of following.  Patient acknowledged.  2. Left BKA wound dehiscence- AKA completed yesterday by Dr. Oneida Alar 3. ESRD -HD MWF- HD this AM per regular schedule using low K bath.  4. Anemia -Hgb drop 6.6  today.give Aranesp 150 qFridays. 1 unit pRBC ordered. tsat 7%, order IV iron.  5. Secondary hyperparathyroidism - corrected calcium at goal phosphorus 5.7, continue binders, VDRA and sensipar 6. HTN/volume - BP improved with HD. Does not appear grossly volume overloaded, plan for UF 4L today.    7. Nutrition albumin 2.5. Patient refuses renal carb diet always asked for regular.  Provided education about following renal diet.  8. Bipolar disorder underlying personality disorder=Meds Per admit 9. Type II DM - chronically uncontrolled plan Per admit   Jen Mow, PA-C [facility] 10/16/2020,11:57 AM  LOS: 2 days

## 2020-10-16 NOTE — Progress Notes (Addendum)
OT Cancellation Note  Patient Details Name: [name] MRN: [id number] DOB: Jan 27, 1981   Cancelled Treatment:    Reason Eval/Treat Not Completed: Patient at procedure or test/ unavailable. Pt off unit at HD. Also noted Hgb 6.6 with planned blood transfusion during HD. Will follow-up for OT eval as time allows.   Addendum: Re-attempted OT eval this afternoon after pt return to unit. Per RN, pt with IV malfunction and pt adamant on need for IV medications. Will re-attempt OT session in AM.  Layla Maw 10/16/2020, 12:22 PM

## 2020-10-16 NOTE — Progress Notes (Signed)
PROGRESS NOTE    [name]  YOV:[id number] DOB: [birth year] DOA: 10/13/2020 PCP: Kerin Perna, NP   Brief Narrative: 40 year old with past medical history significant for 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 -Recently hospitalized and underwent left BKA on 08/2020 for left foot and ankle osteomyelitis.  Had a long hospital stay, when he decided to leave AMA  on 1/15.  -Back to the ED 1/16 with dehiscence of his left BKA after a fall, missed hemodialysis and hyperkalemia -Long history of noncompliance  Assessment & Plan:   Left BKA wound dehiscence: -Fell multiples times at home. Presented with wound dehsicence.  -Underwent AKA 1/18. -Started on Dilaudid PCA per VVS, attempt to transition to p.o. in 1 to 2 days -Physical therapy consult  ESRD on hemodialysis Hyperkalemia -Noncompliant with diet or fluid restriction -Per nephrology -Add daily Lokelma for now  Acute blood loss anemia -Hemoglobin down to 6.6 today secondary to Intra-Op blood loss and background of anemia of chronic disease -Transfuse 1 unit of PRBC -Recent anemia panel with iron deficiency and signs of chronic disease -Continue iron and EPO with HD  Insulin-dependent type 2 diabetes mellitus -CBGs are stable, continue NovoLog with meals and sliding scale insulin  Narcotic Dependence:  -On Percocet at baseline, currently on a Dilaudid PCA  Hyponatremia; in setting renal failure. Correct with HD.   Pressure Injury 10/14/20 Wrist Left;Anterior Stage 2 -  Partial thickness loss of dermis presenting as a shallow open injury with a red, pink wound bed without slough. (Active)  10/14/20 2100  Location: Wrist  Location Orientation: Left;Anterior  Staging: Stage 2 -  Partial thickness loss of dermis presenting as a shallow open injury with a red, pink  wound bed without slough.  Wound Description (Comments):   Present on Admission: Yes     Estimated body mass index is 24.9 kg/m as calculated from the following:   Height as of this encounter: 5\' 7"  (1.702 m).   Weight as of this encounter: 72.1 kg.   DVT prophylaxis: Heparin Code Status: Full code Family Communication: care discussed with patient, Mom was present at bedside.  Disposition Plan:  Status is: Inpatient  Remains inpatient appropriate because:Hemodynamically unstable   Dispo: The patient is from: Home              Anticipated d/c is to: to be determineD              Anticipated d/c date is: 2 days              Patient currently is not medically stable to d/c.  Consultants:   Vascular.,  Nephrology   Procedures: Left above-knee amputation 1/18  Antimicrobials:    Subjective: -On Dilaudid PCA at this time continues to have considerable pain but controlled on PCA  Objective: Vitals:   10/16/20 1230 10/16/20 1250 10/16/20 1307 10/16/20 1349  BP: 139/68 (!) 132/55 (!) 141/51 (!) 154/56  Pulse:    (!) 101  Resp: 13 11  18   Temp:   97.9 F (36.6 C) 98.7 F (37.1 C)  TempSrc:   Oral Oral  SpO2:  99%  100%  Weight:  72.1 kg    Height:        Intake/Output Summary (Last 24 hours) at 10/16/2020 1407 Last data filed at 10/16/2020 1250 Gross per 24 hour  Intake 2285 ml  Output 4000 ml  Net -1715 ml   Filed Weights   10/16/20 0500 10/16/20 1014 10/16/20 1250  Weight: 74.9 kg 76.1 kg 72.1 kg    Examination:  General exam: Chronically ill young male sitting up in bed, AAOx3 HEENT: No JVD CVS: S1-S2, regular rate rhythm Lungs: Diminished breath sounds at bases Abdomen: Soft, nontender, bowel sounds present Extremities: Left BKA with dressing   Data Reviewed: I have personally reviewed following labs and imaging studies  CBC: Recent Labs  Lab 10/11/20 1403 10/14/20 1020 10/14/20 1153 10/15/20 0327 10/16/20 0256  WBC 8.0 9.6  --  9.6 10.2   NEUTROABS  --  6.9  --   --   --   HGB 7.5* 7.6* 8.2* 7.1* 6.6*  HCT 24.7* 24.3* 24.0* 24.2* 22.1*  MCV 97.6 96.0  --  97.6 97.4  PLT 242 252  --  238 710   Basic Metabolic Panel: Recent Labs  Lab 10/11/20 1230 10/14/20 1153 10/14/20 1209 10/14/20 1807 10/15/20 0327 10/15/20 0842 10/16/20 0256  NA 131*   < > 131* 129* 130* 132* 134*  K 6.9*   < > 6.9* 6.1* 6.8* 5.0 6.0*  CL 92*   < > 95* 93* 91* 95* 96*  CO2 27  --  22 21* 25 23 23   GLUCOSE 162*   < > 301* 322* 245* 371* 125*  BUN 63*   < > 72* 73* 77* 35* 44*  CREATININE 7.50*   < > 9.24* 9.33* 9.61* 5.99* 7.26*  CALCIUM 9.1  --  7.9* 8.1* 7.7* 7.8* 7.4*  PHOS 5.7*  --   --   --   --   --   --    < > = values in this interval not displayed.   GFR: Estimated Creatinine Clearance: 12.5 mL/min (A) (by C-G formula based on SCr of 7.26 mg/dL (H)). Liver Function Tests: Recent Labs  Lab 10/11/20 1230  ALBUMIN 2.5*   No results for input(s): LIPASE, AMYLASE in the last 168 hours. No results for input(s): AMMONIA in the last 168 hours. Coagulation Profile: No results for input(s): INR, PROTIME in the last 168 hours. Cardiac Enzymes: No results for input(s): CKTOTAL, CKMB, CKMBINDEX, TROPONINI in the last 168 hours. BNP (last 3 results) No results for input(s): PROBNP in the last 8760 hours. HbA1C: No results for input(s): HGBA1C in the last 72 hours. CBG: Recent Labs  Lab 10/15/20 2149 10/16/20 0249 10/16/20 0647 10/16/20 0820 10/16/20 1345  GLUCAP 211* 126* 158* 109* 157*   Lipid Profile: No results for input(s): CHOL, HDL, LDLCALC, TRIG, CHOLHDL, LDLDIRECT in the last 72 hours. Thyroid Function Tests: No results for input(s): TSH, T4TOTAL, FREET4, T3FREE, THYROIDAB in the last 72 hours. Anemia Panel: Recent Labs    10/14/20 1807  FERRITIN 1,021*  TIBC 284  IRON 20*   Sepsis Labs: No results for input(s): PROCALCITON, LATICACIDVEN in the last 168 hours.  Recent Results (from the past 240 hour(s))   Resp Panel by RT-PCR (Flu A&B, Covid) Nasopharyngeal Swab     Status: None   Collection Time: 10/14/20  8:37 AM   Specimen: Nasopharyngeal Swab; Nasopharyngeal(NP) swabs in vial transport medium  Result Value Ref Range Status   SARS Coronavirus 2 by RT PCR NEGATIVE NEGATIVE Final    Comment: (NOTE) SARS-CoV-2 target nucleic acids are NOT DETECTED.  The SARS-CoV-2 RNA is generally detectable in upper respiratory specimens during the acute phase of infection. The lowest concentration of SARS-CoV-2 viral copies this assay  can detect is 138 copies/mL. A negative result does not preclude SARS-Cov-2 infection and should not be used as the sole basis for treatment or other patient management decisions. A negative result may occur with  improper specimen collection/handling, submission of specimen other than nasopharyngeal swab, presence of viral mutation(s) within the areas targeted by this assay, and inadequate number of viral copies(<138 copies/mL). A negative result must be combined with clinical observations, patient history, and epidemiological information. The expected result is Negative.  Fact Sheet for Patients:  EntrepreneurPulse.com.au  Fact Sheet for Healthcare Providers:  IncredibleEmployment.be  This test is no t yet approved or cleared by the [place] FDA and  has been authorized for detection and/or diagnosis of SARS-CoV-2 by FDA under an Emergency Use Authorization (EUA). This EUA will remain  in effect (meaning this test can be used) for the duration of the COVID-19 declaration under Section 564(b)(1) of the Act, 21 U.S.C.section 360bbb-3(b)(1), unless the authorization is terminated  or revoked sooner.       Influenza A by PCR NEGATIVE NEGATIVE Final   Influenza B by PCR NEGATIVE NEGATIVE Final    Comment: (NOTE) The Xpert Xpress SARS-CoV-2/FLU/RSV plus assay is intended as an aid in the diagnosis of influenza from  Nasopharyngeal swab specimens and should not be used as a sole basis for treatment. Nasal washings and aspirates are unacceptable for Xpert Xpress SARS-CoV-2/FLU/RSV testing.  Fact Sheet for Patients: EntrepreneurPulse.com.au  Fact Sheet for Healthcare Providers: IncredibleEmployment.be  This test is not yet approved or cleared by the [place] FDA and has been authorized for detection and/or diagnosis of SARS-CoV-2 by FDA under an Emergency Use Authorization (EUA). This EUA will remain in effect (meaning this test can be used) for the duration of the COVID-19 declaration under Section 564(b)(1) of the Act, 21 U.S.C. section 360bbb-3(b)(1), unless the authorization is terminated or revoked.  Performed at [facility], [place] [address] [place], [place] [address]   Surgical pcr screen     Status: None   Collection Time: 10/15/20  4:36 AM   Specimen: Nasal Mucosa; Nasal Swab  Result Value Ref Range Status   MRSA, PCR NEGATIVE NEGATIVE Final   Staphylococcus aureus NEGATIVE NEGATIVE Final    Comment: (NOTE) The Xpert SA Assay (FDA approved for NASAL specimens in patients 17 years of age and older), is one component of a comprehensive surveillance program. It is not intended to diagnose infection nor to guide or monitor treatment. Performed at [facility], [place] [address] [place],  [address]      Radiology Studies: No results found.  Scheduled Meds: . (feeding supplement) PROSource Plus  30 mL Oral TID BM  . sodium chloride   Intravenous Once  . calcitRIOL      . calcitRIOL  2.5 mcg Oral Q M,W,F-HD  . Chlorhexidine Gluconate Cloth  6 each Topical Daily  . Chlorhexidine Gluconate Cloth  6 each Topical Q0600  . cinacalcet  60 mg Oral BID WC  . [START ON 10/18/2020] darbepoetin (ARANESP) injection - DIALYSIS  150 mcg Intravenous Q Fri-HD  . heparin  5,000 Units Subcutaneous Q12H  . HYDROmorphone   Intravenous Q4H  .  insulin aspart  0-6 Units Subcutaneous TID WC  . insulin aspart  3 Units Subcutaneous TID WC  . polyethylene glycol  17 g Oral Daily  . senna-docusate  1 tablet Oral BID  . sevelamer carbonate  4,000 mg Oral TID WC  . sodium zirconium cyclosilicate  5  g Oral Daily   Continuous Infusions: . [START ON 10/18/2020] ferric gluconate (FERRLECIT/NULECIT) IV       LOS: 2 days    Time spent: 25 minutes.   Domenic Polite, MD [facility]  10/16/2020, 2:07 PM

## 2020-10-16 NOTE — Progress Notes (Signed)
Patient gone to hemodialysis.  

## 2020-10-17 DIAGNOSIS — L089 Local infection of the skin and subcutaneous tissue, unspecified: Secondary | ICD-10-CM

## 2020-10-17 DIAGNOSIS — E11628 Type 2 diabetes mellitus with other skin complications: Secondary | ICD-10-CM

## 2020-10-17 LAB — TYPE AND SCREEN
ABO/RH(D): B POS
Antibody Screen: NEGATIVE
Unit division: 0

## 2020-10-17 LAB — BASIC METABOLIC PANEL
Anion gap: 12 (ref 5–15)
BUN: 35 mg/dL — ABNORMAL HIGH (ref 6–20)
CO2: 24 mmol/L (ref 22–32)
Calcium: 7.4 mg/dL — ABNORMAL LOW (ref 8.9–10.3)
Chloride: 97 mmol/L — ABNORMAL LOW (ref 98–111)
Creatinine, Ser: 6.56 mg/dL — ABNORMAL HIGH (ref 0.61–1.24)
GFR, Estimated: 10 mL/min — ABNORMAL LOW (ref >60)
Glucose, Bld: 231 mg/dL — ABNORMAL HIGH (ref 70–99)
Potassium: 5.4 mmol/L — ABNORMAL HIGH (ref 3.5–5.1)
Sodium: 133 mmol/L — ABNORMAL LOW (ref 135–145)

## 2020-10-17 LAB — CBC
HCT: 24.3 % — ABNORMAL LOW (ref 39.0–52.0)
Hemoglobin: 7.2 g/dL — ABNORMAL LOW (ref 13.0–17.0)
MCH: 28.5 pg (ref 26.0–34.0)
MCHC: 29.6 g/dL — ABNORMAL LOW (ref 30.0–36.0)
MCV: 96 fL (ref 80.0–100.0)
Platelets: 208 10*3/uL (ref 150–400)
RBC: 2.53 MIL/uL — ABNORMAL LOW (ref 4.22–5.81)
RDW: 15.3 % (ref 11.5–15.5)
WBC: 10.2 10*3/uL (ref 4.0–10.5)
nRBC: 0 % (ref 0.0–0.2)

## 2020-10-17 LAB — BPAM RBC
Blood Product Expiration Date: 202201252359
ISSUE DATE / TIME: 202201191614
Unit Type and Rh: 7300

## 2020-10-17 LAB — GLUCOSE, CAPILLARY
Glucose-Capillary: 184 mg/dL — ABNORMAL HIGH (ref 70–99)
Glucose-Capillary: 208 mg/dL — ABNORMAL HIGH (ref 70–99)
Glucose-Capillary: 238 mg/dL — ABNORMAL HIGH (ref 70–99)
Glucose-Capillary: 221 mg/dL — ABNORMAL HIGH (ref 70–99)
Glucose-Capillary: 175 mg/dL — ABNORMAL HIGH (ref 70–99)

## 2020-10-17 MED ORDER — SODIUM ZIRCONIUM CYCLOSILICATE 10 G PO PACK
10.0000 g | PACK | Freq: Every day | ORAL | Status: DC
  Administered 2020-10-18 – 2020-10-21 (×4): 10 g via ORAL
  Filled 2020-10-17 (×4): qty 1

## 2020-10-17 MED ORDER — CHLORHEXIDINE GLUCONATE CLOTH 2 % EX PADS
6.0000 | MEDICATED_PAD | Freq: Every day | CUTANEOUS | Status: DC
  Administered 2020-10-19 – 2020-10-20 (×2): 6 via TOPICAL

## 2020-10-17 MED ORDER — INSULIN ASPART 100 UNIT/ML ~~LOC~~ SOLN
5.0000 [IU] | Freq: Three times a day (TID) | SUBCUTANEOUS | Status: DC
  Administered 2020-10-17 – 2020-10-19 (×4): 5 [IU] via SUBCUTANEOUS

## 2020-10-17 NOTE — Progress Notes (Signed)
Orthopedic Tech Progress Note Patient Details:  CUTLER SUNDAY 1980-10-17 [id number] Ordered brace Patient ID: [name], male   DOB: 1980-12-14, 40 y.o.   MRN: [id number]   Ellouise Newer 10/17/2020, 9:13 AM

## 2020-10-17 NOTE — TOC Initial Note (Addendum)
Transition of Care [facility]) - Initial/Assessment Note    Patient Details  Name: [name] MRN: [id number] Date of Birth: May 03, 1981  Transition of Care [facility]) CM/SW Contact:    Bartholomew Crews, RN Phone Number: [phone] 10/17/2020, 1:01 PM  Clinical Narrative:                  Spoke with patient at the bedside. Patient stated that he will be moving out of his 2nd floor apartment on 11/26/2020. Patient not as concerned about getting up and down the stairs to his apartment as he is about getting around his apartment in wheelchair. He stated that the wheelchair was too big and he found himself using the walker to get around instead of wheelchair. Discussed his PCS hours - he has an aide, Angie, through [facility], who provides 80 hours a month. Patient asked about getting his hours increased to 120 hours. DMA 0109 form completed for CIC d/t medical necessity and signed by MD - faxed to [employer] expedited fax [phone]. Spoke with representative at [facility] to advise that form had been faxed. NCM contact information provided for follow up. TOC following for transition needs.   1610: Spoke with representative at [employer]. Confirmed receipt of medical change in condition and request for increased PCS hours - it is under review which can take 2-3 business days.   Expected Discharge Plan: Home/Self Care Barriers to Discharge: Continued Medical Work up   Patient Goals and CMS Choice Patient states their goals for this hospitalization and ongoing recovery are:: home CMS Medicare.gov Compare Post Acute Care list provided to:: Patient Choice offered to / list presented to : NA  Expected Discharge Plan and Services Expected Discharge Plan: Home/Self Care In-house Referral: NA Discharge Planning Services: CM Consult Post Acute Care Choice: NA Living arrangements for the past 2 months: Apartment                 DME Arranged: N/A DME  Agency: NA       HH Arranged: NA HH Agency: NA        Prior Living Arrangements/Services Living arrangements for the past 2 months: Apartment Lives with:: Self,Parents Patient language and need for interpreter reviewed:: Yes Do you feel safe going back to the place where you live?: Yes      Need for Family Participation in Patient Care: Yes (Comment) Care giver support system in place?: Yes (comment) Current home services: Homehealth aide,DME (80 PCS hours per month; wheelchair; walker; crutches) Criminal Activity/Legal Involvement Pertinent to Current Situation/Hospitalization: No - Comment as needed  Activities of Daily Living Home Assistive Devices/Equipment: None ADL Screening (condition at time of admission) Patient's cognitive ability adequate to safely complete daily activities?: Yes Is the patient deaf or have difficulty hearing?: No Does the patient have difficulty seeing, even when wearing glasses/contacts?: No Does the patient have difficulty concentrating, remembering, or making decisions?: No Patient able to express need for assistance with ADLs?: Yes Does the patient have difficulty dressing or bathing?: No Independently performs ADLs?: Yes (appropriate for developmental age) Does the patient have difficulty walking or climbing stairs?: No Weakness of Legs: Both Weakness of Arms/Hands: None  Permission Sought/Granted Permission sought to share information with : Family Supports    Share Information with NAME: Imran Nuon     Permission granted to share info w Relationship: mother  Permission granted to share info w Contact Information: [phone]  Emotional Assessment Appearance:: Appears stated  age Attitude/Demeanor/Rapport: Engaged Affect (typically observed): Accepting Orientation: : Oriented to Self,Oriented to  Time,Oriented to Place,Oriented to Situation Alcohol / Substance Use: Not Applicable Psych Involvement: No (comment)  Admission  diagnosis:  Wound dehiscence [T81.30XA] Severe comorbid illness [R69] Wound infection [T14.8XXA, L08.9] Patient Active Problem List   Diagnosis Date Noted  . Pressure injury of skin 10/15/2020  . Wound infection 10/14/2020  . Wound dehiscence   . Adjustment disorder, unspecified 09/21/2020  . Malnutrition of moderate degree 09/13/2020  . Osteomyelitis of left foot ([place]) 09/11/2020  . Volume overload 07/22/2020  . Hypocalcemia 06/07/2020  . Diabetic ulcer of heel ([place]) 05/31/2020  . Type 2 diabetes mellitus with foot ulcer ([place]) 04/23/2020  . COVID-19 02/20/2020  . Personal history of COVID-19 02/20/2020  . Diabetic ulcer of left midfoot associated with type 1 diabetes mellitus, with fat layer exposed ([place])   . Charcot's joint of foot, left   . Pneumonia due to COVID-19 virus 02/14/2020  . Cellulitis of left foot 01/31/2020  . Allergy, unspecified, initial encounter 07/17/2019  . Diarrhea 04/24/2019  . Bone erosion determined by x-ray   . Encephalopathy acute   . Diabetic ulcer of left midfoot associated with diabetes mellitus due to underlying condition, with necrosis of bone ([place])   . Open wound of left foot   . Encounter for orthopedic aftercare following surgical amputation   . Foot infection   . Gangrene of toe of left foot ([place])   . Cellulitis 10/26/2018  . Diabetic ulcer of left midfoot associated with diabetes mellitus due to underlying condition, with bone involvement without evidence of necrosis ([place])   . Nausea vomiting and diarrhea 10/20/2018  . DM (diabetes mellitus), secondary, uncontrolled, with neurologic complications ([place])   . Headache 09/23/2018  . Acute osteomyelitis involving ankle and foot, left ([place])   . Anemia of chronic disease 08/18/2018  . Encounter for immunization 06/29/2018  . Hyperkalemia   . Unspecified protein-calorie malnutrition ([place]) 02/14/2018  . Encounter for removal of sutures 02/07/2018  . Fluid overload, unspecified 01/22/2018  .  Osteomyelitis of ankle or foot, acute, right ([place]) 10/30/2017  . Cellulitis and abscess of toe of left foot   . Diabetic foot infection ([place])   . Insulin dependent type 1 diabetes mellitus ([place]) 10/25/2017  . Diabetic gastroparesis ([place]) 10/25/2017  . Sepsis ([place]) 10/25/2017  . Fever, unspecified 10/28/2016  . Iron deficiency anemia, unspecified 10/28/2016  . Other specified coagulation defects ([place]) 10/28/2016  . Pruritus, unspecified 10/28/2016  . Secondary hyperparathyroidism of renal origin ([place]) 10/28/2016  . Shortness of breath 10/28/2016  . HTN (hypertension) 08/09/2014  . ESRD on dialysis ([place]) 03/19/2014  . Metabolic acidosis 07/14/5101   PCP:  Kerin Perna, NP Pharmacy:   [facility] [place] - [address] [place] [address] Phone: [phone] Fax: [phone]     Social Determinants of Health (SDOH) Interventions    Readmission Risk Interventions No flowsheet data found.

## 2020-10-17 NOTE — Consult Note (Signed)
WOC Nurse Consult Note: Patient receiving care in [facility] [phone]. Reason for Consult: left wrist wound Wound type: healing former abscess site x approximately 4 months ago Pressure Injury POA: Yes/No/NA Measurement: 2.5 cm x 2 cm Wound bed: 100% pink Drainage (amount, consistency, odor) none Periwound: thickened, darker but intact Dressing procedure/placement/frequency:  Wash left wrist wound with soap and water, pat dry. Place a small piece of Xeroform gauze Kellie Simmering [phone]) over the wound, then a 2 x 2 gauze, secure with kerlex.  Monitor the wound area(s) for worsening of condition such as: Signs/symptoms of infection,  Increase in size,  Development of or worsening of odor, Development of pain, or increased pain at the affected locations.  Notify the medical team if any of these develop.  Thank you for the consult.  Discussed plan of care with the patient.  [place] nurse will not follow at this time.  Please re-consult the [place] team if needed.  Val Riles, RN, MSN, CWOCN, CNS-BC, pager [phone]

## 2020-10-17 NOTE — Evaluation (Signed)
Physical Therapy Evaluation Patient Details Name: [name] MRN: [id number] DOB: 29-Aug-1981 Today's Date: 10/17/2020   History of Present Illness  [name] is a 40 y.o. 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 Patient was discharged 4 days ago, and he claimed that since then he has been very unsteady and fell couple of times and yesterday he fell again and twisted the left leg, and stitches on the left BKA tore off in the morning tore open. Pt underwent L AKA on 1/18  Clinical Impression  Pt initially resistant to therapist, but with education regarding need for PT to get prosthetic pt more agreeable. Pt with multiple falls following d/c (AMA) following L BKA. Pt now with L AKA due to falls. Pt states he doesn't want to fall again and would like to be at at w/c level if not with therapy, but w/c does not fit his house and is too wide for him. Pt receptive to education regarding positioning and need to perform bed mobility and exercises. Pt demonstrating deficits in balance, strength, coordination, endurance, safety, balance, DME and gait. Pt will benefit from skilled PT to address deficits to maximize independence with functional mobility prior to discharge. Due to multiple falls PT is recommending SNF for additional therapy following d/c, but due to previous admission pt states he will be going home. Pt will need new w/c to fit him and his house as well as follow up PT and 24 hour S    Follow Up Recommendations SNF vs Home health PT;Supervision/Assistance - 24 hour (pending progress and compliance)    Equipment Recommendations  Wheelchair (measurements PT);Wheelchair cushion (measurements PT) (pt state w/c given was not appropriate and too wide for him as well as to get through any doorways in his house)     Recommendations for Other Services       Precautions / Restrictions Precautions Precautions: Fall Precaution Comments: high fall risk Restrictions Weight Bearing Restrictions: Yes LLE Weight Bearing: Non weight bearing      Mobility  Bed Mobility Overal bed mobility: Needs Assistance Bed Mobility: Rolling Rolling: Supervision         General bed mobility comments: Pt able to come to long sitting, roll partially to side (limited by pain) and change position in bed. Declined sitting EOB; increased time needed increased education given regarding need to continue to perform rolling to decrease pain    Transfers                 General transfer comment: pt declined due to pain  Ambulation/Gait                Stairs            Wheelchair Mobility    Modified Rankin (Stroke Patients Only)       Balance Overall balance assessment: Needs assistance Sitting-balance support: No upper extremity supported Sitting balance-[name]: Good                                       Pertinent Vitals/Pain Pain Assessment: Faces Faces Pain Scale: Hurts whole lot Pain Location: L LE Pain Descriptors / Indicators: Guarding;Grimacing Pain Intervention(s): Repositioned;Other (comment) (pt using PCA pump)    Home Living Family/patient expects to be discharged to:: Private residence  Living Arrangements: Alone Available Help at Discharge: Family;Available PRN/intermittently;Personal care attendant Type of Home: Apartment Home Access: Stairs to enter Entrance Stairs-Rails: Right Entrance Stairs-Number of Steps: 13 Home Layout: One level Home Equipment: Walker - 2 wheels;Crutches;Wheelchair - manual Additional Comments: Appears pt's mother has been staying with pt in his second floor apartment. Pt reports wheelchair too big to fit through bathroom/bedroom doorways. Reports plan to move to one level home with 2-3 steps to enter at beginning of March  this year    Prior Function Level of Independence: Needs assistance   Gait / Transfers Assistance Needed: Was scooting up steps on buttocks. Wheelchair unable to fit through bathroom/bedroom doorway so pt was using RW in/out of these rooms. endorses frequent falls  ADL's / Homemaking Assistance Needed: personal care attendant daily, unsure of how long each day. Assists with ADLs, IADLs as needed. Reports allowance of 80 hours per month  Comments: frequent falls since pt discharged home, pt states he has an w/c that wont fit through his doorways and was too wide for him to use     Hand Dominance   Dominant Hand: Right    Extremity/Trunk Assessment   Upper Extremity Assessment Upper Extremity Assessment: Defer to OT evaluation    Lower Extremity Assessment Lower Extremity Assessment: LLE deficits/detail LLE Deficits / Details: pt with new AKA, minimal movement noted due to pain and unable to assess sensation LLE: Unable to fully assess due to pain    Cervical / Trunk Assessment Cervical / Trunk Assessment: Normal  Communication   Communication: No difficulties  Cognition Arousal/Alertness: Awake/alert Behavior During Therapy: WFL for tasks assessed/performed Overall Cognitive Status: Impaired/Different from baseline Area of Impairment: Safety/judgement;Problem solving                   Current Attention Level: Sustained Memory: Decreased short-term memory   Safety/Judgement: Decreased awareness of safety;Decreased awareness of deficits Awareness: Emergent Problem Solving: Requires verbal cues;Difficulty sequencing General Comments: Pt with decreased awareness of safety techniques, pleasant and engaged in conversation/education.      General Comments General comments (skin integrity, edema, etc.): increased education given to pt regarding positioning and need to maintain neutral hip alignment in bed, education on ROM needs for prosthetic, education on transitioning  to sidelying to perform hip extension, educaiton to perform prone positioning at home. Increased time spet educating pt due to multiple questions    Exercises     Assessment/Plan    PT Assessment Patient needs continued PT services  PT Problem List Decreased strength;Decreased range of motion;Decreased activity tolerance;Decreased balance;Decreased mobility;Decreased safety awareness;Decreased knowledge of use of DME       PT Treatment Interventions DME instruction;Gait training;Stair training;Functional mobility training;Therapeutic exercise;Therapeutic activities;Balance training;Patient/family education;Wheelchair mobility training    PT Goals (Current goals can be found in the Care Plan section)  Acute Rehab PT Goals Patient Stated Goal: to get a prosthetic limb PT Goal Formulation: With patient Time For Goal Achievement: 10/30/20 Potential to Achieve Goals: Fair    Frequency Min 3X/week   Barriers to discharge Decreased caregiver support      Co-evaluation   Reason for Co-Treatment: Necessary to address cognition/behavior during functional activity;For patient/therapist safety   OT goals addressed during session: ADL's and self-care;Strengthening/ROM       AM-PAC PT "6 Clicks" Mobility  Outcome Measure Help needed turning from your back to your side while in a flat bed without using bedrails?: None Help needed moving from lying on your back to sitting on  the side of a flat bed without using bedrails?: A Little Help needed moving to and from a bed to a chair (including a wheelchair)?: A Little Help needed standing up from a chair using your arms (e.g., wheelchair or bedside chair)?: A Little Help needed to walk in hospital room?: A Lot Help needed climbing 3-5 steps with a railing? : A Lot 6 Click Score: 17    End of Session   Activity Tolerance: Patient limited by pain Patient left: in bed;with call bell/phone within reach Nurse Communication: Mobility status PT  Visit Diagnosis: Unsteadiness on feet (R26.81);Other abnormalities of gait and mobility (R26.89);Muscle weakness (generalized) (M62.81);Difficulty in walking, not elsewhere classified (R26.2);Pain Pain - Right/Left: Left Pain - part of body: Leg    Time: 9030-0923 PT Time Calculation (min) (ACUTE ONLY): 28 min   Charges:   PT Evaluation $PT Eval Low Complexity: [place], DPT Acute Rehabilitation Services [phone]  [place] 10/17/2020, 12:36 PM

## 2020-10-17 NOTE — Evaluation (Signed)
Occupational Therapy Evaluation Patient Details Name: [name] MRN: [id number] DOB: 06/26/1981 Today's Date: 10/17/2020    History of Present Illness [name] is a 40 y.o. 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 Patient was discharged 4 days ago, and he claimed that since then he has been very unsteady and fell couple of times and yesterday he fell again and twisted the left leg, and stitches on the left BKA tore off in the morning tore open. Pt underwent L AKA on 1/18   Clinical Impression   PTA, pt lives in second floor apartment. Pt typically lives alone but his mother has been staying with him after recent discharge. Pt has PCA who assists with ADLs/IADLs as needed, reports typically able to toilet and dress self but had increasing number of falls at home. Pt reports use of walker in bathroom/bedroom due to wheelchair unable to fit through doorways. Pt presents now with reports of high pain levels and declined OOB activities today. Pt reports plan to discharge at wheelchair level to decrease fall risk. Session focused on education on safety, positioning and importance of healing residual limb for prosthetic preparation. Plan to further reinforce LB ADLs with compensatory strategies and progress OOB transfers during next session. Strongly recommend SNF for short term rehab due to high fall risk and hx of noncompliance. However, pt reports plan is to discharge home - would need 24/7 assist and HHOT follow-up if pt opts to decline postacute rehab.    Follow Up Recommendations  SNF;Supervision/Assistance - 24 hour (if pt declines SNF, will need HHOT and 24/7)    Equipment Recommendations  3 in 1 bedside commode;Other (comment);Tub/shower bench;Wheelchair (measurements OT);Wheelchair cushion (measurements OT) (needs  smaller wheelchair)    Recommendations for Other Services       Precautions / Restrictions Precautions Precautions: Fall Precaution Comments: high fall risk Restrictions Weight Bearing Restrictions: Yes LLE Weight Bearing: Non weight bearing      Mobility Bed Mobility Overal bed mobility: Modified Independent             General bed mobility comments: Pt able to come to long sitting, roll partially to side (limited by pain) and change position in bed. Declined sitting EOB    Transfers                 General transfer comment: pt declined due to pain    Balance Overall balance assessment: Needs assistance Sitting-balance support: No upper extremity supported Sitting balance-[name]: Good                                     ADL either performed or assessed with clinical judgement   ADL Overall ADL's : Needs assistance/impaired Eating/Feeding: Independent;Sitting Eating/Feeding Details (indicate cue type and reason): reaching for cup on tray table, opening containers Grooming: Independent;Sitting   Upper Body Bathing: Set up;Sitting   Lower Body Bathing: Minimal assistance;Sitting/lateral leans   Upper Body Dressing : Set up;Sitting   Lower Body Dressing: Minimal assistance;Sitting/lateral leans       Toileting- Clothing Manipulation and Hygiene: Minimal assistance;Sitting/lateral lean         General ADL Comments: Will need increased assist in standing for LB ADLs due to high fall risk and hx of poor balance. Pt with increased awareness of  impact of falls, reports plan to DC home at wheelchair level     Vision Baseline Vision/History: No visual deficits Patient Visual Report: No change from baseline Vision Assessment?: No apparent visual deficits     Perception     Praxis      Pertinent Vitals/Pain Pain Assessment: Faces Faces Pain Scale: Hurts even more Pain Location: L LE Pain Descriptors / Indicators:  Guarding;Grimacing Pain Intervention(s): Repositioned;Other (comment) (pt using PCA pump)     Hand Dominance Right   Extremity/Trunk Assessment Upper Extremity Assessment Upper Extremity Assessment: Overall WFL for tasks assessed   Lower Extremity Assessment Lower Extremity Assessment: Defer to PT evaluation   Cervical / Trunk Assessment Cervical / Trunk Assessment: Normal   Communication Communication Communication: No difficulties   Cognition Arousal/Alertness: Awake/alert Behavior During Therapy: WFL for tasks assessed/performed Overall Cognitive Status: Impaired/Different from baseline Area of Impairment: Safety/judgement;Problem solving                         Safety/Judgement: Decreased awareness of safety;Decreased awareness of deficits   Problem Solving: Requires verbal cues;Difficulty sequencing General Comments: Pt with decreased awareness of safety techniques, pleasant and engaged in conversation/education.   General Comments  Educated pt on compensatory strategies for LB ADLs , continued collaboration needed. Session focused on beginning mobilization, proper positioning and preparation for prosthetic. Plan to assess OOB activities during next session given pain improvements    Exercises     Shoulder Instructions      Home Living Family/patient expects to be discharged to:: Private residence Living Arrangements: Alone Available Help at Discharge: Family;Available PRN/intermittently;Personal care attendant Type of Home: Apartment Home Access: Stairs to enter Entrance Stairs-Number of Steps: 13 Entrance Stairs-Rails: Right Home Layout: One level     Bathroom Shower/Tub: Teacher, early years/pre: Handicapped height Bathroom Accessibility: Yes How Accessible: Accessible via walker Home Equipment: Walker - 2 wheels;Crutches;Wheelchair - manual   Additional Comments: Appears pt's mother has been staying with pt in his second floor  apartment. Pt reports wheelchair too big to fit through bathroom/bedroom doorways. Reports plan to move to one level home with 2-3 steps to enter at beginning of March this year      Prior Functioning/Environment Level of Independence: Needs assistance  Gait / Transfers Assistance Needed: Was scooting up steps on buttocks. Wheelchair unable to fit through bathroom/bedroom doorway so pt was using RW in/out of these rooms. endorses frequent falls ADL's / Homemaking Assistance Needed: personal care attendant daily, unsure of how long each day. Assists with ADLs, IADLs as needed. Reports allowance of 80 hours per month   Comments: frequent falls since pt discharged home        OT Problem List: Decreased strength;Impaired balance (sitting and/or standing);Decreased safety awareness;Decreased knowledge of use of DME or AE;Decreased cognition      OT Treatment/Interventions: Self-care/ADL training;Therapeutic exercise;DME and/or AE instruction;Therapeutic activities;Patient/family education;Balance training    OT Goals(Current goals can be found in the care plan section) Acute Rehab OT Goals Patient Stated Goal: to go home OT Goal Formulation: With patient Time For Goal Achievement: 10/31/20 Potential to Achieve Goals: Good ADL Goals Pt Will Perform Grooming: with modified independence;standing Pt Will Perform Lower Body Bathing: with modified independence;sitting/lateral leans Pt Will Perform Lower Body Dressing: with modified independence;sitting/lateral leans Pt Will Transfer to Toilet: with modified independence;squat pivot transfer;stand pivot transfer;bedside commode Pt Will Perform Toileting - Clothing Manipulation and hygiene: with modified independence;sitting/lateral leans Pt/caregiver will Perform Home  Exercise Program: Increased strength;Both right and left upper extremity;With theraband;With written HEP provided;Independently Additional ADL Goal #1: Pt to demonstrate wheelchair  mobility at Modified Independence to gather ADL items  OT Frequency: Min 2X/week   Barriers to D/C:            Co-evaluation PT/OT/SLP Co-Evaluation/Treatment: Yes Reason for Co-Treatment: Necessary to address cognition/behavior during functional activity;For patient/therapist safety   OT goals addressed during session: ADL's and self-care;Strengthening/ROM      AM-PAC OT "6 Clicks" Daily Activity     Outcome Measure Help from another person eating meals?: None Help from another person taking care of personal grooming?: None Help from another person toileting, which includes using toliet, bedpan, or urinal?: A Little Help from another person bathing (including washing, rinsing, drying)?: A Little Help from another person to put on and taking off regular upper body clothing?: A Little Help from another person to put on and taking off regular lower body clothing?: A Little 6 Click Score: 20   End of Session Nurse Communication: Mobility status  Activity Tolerance: Patient limited by pain Patient left: in bed;with call bell/phone within reach  OT Visit Diagnosis: Unsteadiness on feet (R26.81);Other abnormalities of gait and mobility (R26.89);Other symptoms and signs involving cognitive function                Time: 1037-1110 OT Time Calculation (min): 33 min Charges:  OT General Charges $OT Visit: 1 Visit OT Evaluation $OT Eval Moderate Complexity: 1 Mod  Layla Maw, OTR/L  Layla Maw 10/17/2020, 11:31 AM

## 2020-10-17 NOTE — Progress Notes (Signed)
[facility] Progress Note   Subjective:   Patient seen and examined at bedside. Angry that the IV to his pain medication pump came out sometime between leaving his room for dialysis and returning to the floor.  Per dialysis nursing staff they were not aware it had dislodged as patient slept through treatment.  Machine had been beeping but it was locked out and resolved by following instructions from floor nurse.   Today, pain is better controlled.  Denies CP, SOB, weakness, dizziness and fatigue.   Objective Vitals:   10/17/20 0022 10/17/20 0357 10/17/20 0500 10/17/20 1013  BP:   137/77 (!) 118/51  Pulse:   96 96  Resp: 18 18 18 17   Temp:   99.5 F (37.5 C) 97.7 F (36.5 C)  TempSrc:   Oral Oral  SpO2: 99% 98%  (!) 89%  Weight:      Height:       Physical Exam General:WDWN male in NAD Heart:RRR, no mrg Lungs:CTAB, nml WOB Abdomen:soft, NTND Extremities:no LE edema Dialysis Access: TDC in use   Filed Weights   10/16/20 0500 10/16/20 1014 10/16/20 1250  Weight: 74.9 kg 76.1 kg 72.1 kg    Intake/Output Summary (Last 24 hours) at 10/17/2020 1155 Last data filed at 10/17/2020 0900 Gross per 24 hour  Intake 2034 ml  Output 4000 ml  Net -1966 ml    Additional Objective Labs: Basic Metabolic Panel: Recent Labs  Lab 10/11/20 1230 10/14/20 1153 10/15/20 0842 10/16/20 0256 10/17/20 0647  NA 131*   < > 132* 134* 133*  K 6.9*   < > 5.0 6.0* 5.4*  CL 92*   < > 95* 96* 97*  CO2 27   < > 23 23 24   GLUCOSE 162*   < > 371* 125* 231*  BUN 63*   < > 35* 44* 35*  CREATININE 7.50*   < > 5.99* 7.26* 6.56*  CALCIUM 9.1   < > 7.8* 7.4* 7.4*  PHOS 5.7*  --   --   --   --    < > = values in this interval not displayed.   Liver Function Tests: Recent Labs  Lab 10/11/20 1230  ALBUMIN 2.5*   CBC: Recent Labs  Lab 10/11/20 1403 10/14/20 1020 10/14/20 1153 10/15/20 0327 10/16/20 0256 10/16/20 2323 10/17/20 0647  WBC 8.0 9.6  --  9.6 10.2  --  10.2   NEUTROABS  --  6.9  --   --   --   --   --   HGB 7.5* 7.6*   < > 7.1* 6.6* 7.4* 7.2*  HCT 24.7* 24.3*   < > 24.2* 22.1* 23.6* 24.3*  MCV 97.6 96.0  --  97.6 97.4  --  96.0  PLT 242 252  --  238 196  --  208   < > = values in this interval not displayed.   CBG: Recent Labs  Lab 10/16/20 1345 10/16/20 1654 10/16/20 2004 10/17/20 0533 10/17/20 0916  GLUCAP 157* 275* 261* 184* 208*   Iron Studies:  Recent Labs    10/14/20 1807  IRON 20*  TIBC 284  FERRITIN 1,021*    Medications: . [START ON 10/18/2020] ferric gluconate (FERRLECIT/NULECIT) IV     . (feeding supplement) PROSource Plus  30 mL Oral TID BM  . sodium chloride   Intravenous Once  . calcitRIOL  2.5 mcg Oral Q M,W,F-HD  . Chlorhexidine Gluconate Cloth  6 each Topical Daily  . Chlorhexidine Gluconate Cloth  6 each Topical Q0600  . cinacalcet  60 mg Oral BID WC  . [START ON 10/18/2020] darbepoetin (ARANESP) injection - DIALYSIS  150 mcg Intravenous Q Fri-HD  . heparin  5,000 Units Subcutaneous Q12H  . HYDROmorphone   Intravenous Q4H  . insulin aspart  0-6 Units Subcutaneous TID WC  . insulin aspart  5 Units Subcutaneous TID WC  . polyethylene glycol  17 g Oral Daily  . senna-docusate  1 tablet Oral BID  . sevelamer carbonate  4,000 mg Oral TID WC  . sodium zirconium cyclosilicate  5 g Oral Daily    Dialysis Orders: MWF @ [facility] 4h 53min 400/800 67kg TDC Hep 5000 + 5000 midrun - Calcitriol 25mcg PO q HD - No ESA Noted scheduled for R BC AVF on 09/24/20, canceled/delayed per patient request.  Problem/Plan: 1. Hyperkalemia-ongoing issue with diet noncompliance, K 5.4 this AM, using low K bath. Lokelma ordered daily. Provided education about low K diet and importance of following. Patient acknowledged.  2. Left BKA wound dehiscence-AKA completed 1/18 by Dr. Oneida Alar 3. ESRD -HD MWF-HD tomorrow per regular schedule.  4. Anemia -Hgbdrop 7.2 today.give Aranesp 150 qFridays. 1 unit pRBC given yesterday.  tsat 7%, IV iron ordered. 5. Secondary hyperparathyroidism - corrected calcium at goal last phosphorus 5.7,continue binders, VDRA and sensipar 6. HTN/volume -BP improved with HD. Does not appear grossly volume overloaded.  UF as tolerated.  7. Nutrition - last albumin 2.5. Patient refuses renal carb diet always asked for regular. Provided education about following renal diet.  8. Bipolar disorder underlying personality disorder -Meds Per admit 9. Type II DM - chronically uncontrolled plan Per admit    Jen Mow, PA-C [facility] 10/17/2020,11:55 AM  LOS: 3 days

## 2020-10-17 NOTE — Progress Notes (Signed)
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 -Recently hospitalized and underwent left BKA on 08/2020 for left foot and ankle osteomyelitis.  Had a long hospital stay, when he decided to leave AMA  on 1/15.  -Back to the ED 1/16 with dehiscence of his left BKA after a fall, missed hemodialysis and hyperkalemia -Long history of noncompliance  Assessment & Plan:   Left BKA wound dehiscence: -Recent BKA, fell multiples times at home. Presented with wound dehsicence.  -Underwent AKA 1/18. -Started on Dilaudid PCA per [facility], attempt to wean this off tomorrow -Physical therapy consult  ESRD on hemodialysis Hyperkalemia -Noncompliant with diet or fluid restriction -Per nephrology -Started on daily Lokelma, repeat labs pending  Acute blood loss anemia -Hemoglobin dropped to 6.6 yesterday secondary to Intra-Op blood loss and anemia of chronic disease -transfuse 1 unit of PRBC, repeat hemoglobin up to 7.2 today -Recent anemia panel with iron deficiency and signs of chronic disease -Continue iron and EPO with HD  Insulin-dependent type 2 diabetes mellitus -CBGs in the 200s today, increase NovoLog with meals  Narcotic Dependence:  -On Percocet at baseline, currently on a Dilaudid PCA  Hyponatremia; -Resolved  Pressure Injury 10/14/20 Wrist Left;Anterior Stage 2 -  Partial thickness loss of dermis presenting as a shallow open injury with a red, pink wound bed without slough. (Active)  10/14/20 2100  Location: Wrist  Location Orientation: Left;Anterior  Staging: Stage 2 -  Partial thickness loss of dermis presenting as a shallow open injury with a red, pink wound  bed without slough.  Wound Description (Comments):   Present on Admission: Yes     Estimated body mass index is 24.9 kg/m as calculated from the following:   Height as of this encounter: 5\' 7"  (1.702 m).   Weight as of this encounter: 72.1 kg.   DVT prophylaxis: Heparin subcutaneous Code Status: Full code Family Communication: mother on speaker phone while I was in the room Disposition Plan:  Status is: Inpatient  Remains inpatient appropriate because:Hemodynamically unstable   Dispo: The patient is from: Home              Anticipated d/c is to: to be determined              Anticipated d/c date is: 2 days              Patient currently is not medically stable to d/c.  Consultants:   Vascular.,  Nephrology   Procedures: Left above-knee amputation 1/18  Antimicrobials:    Subjective: -Remains on PCA, adamantly opposed to weaning this off today  Objective: Vitals:   10/17/20 0022 10/17/20 0357 10/17/20 0500 10/17/20 1013  BP:   137/77 (!) 118/51  Pulse:   96 96  Resp: 18 18 18 17   Temp:   99.5 F (37.5 C) 97.7 F (36.5 C)  TempSrc:   Oral Oral  SpO2: 99% 98%  (!) 89%  Weight:      Height:        Intake/Output Summary (Last 24 hours) at 10/17/2020 1132 Last data filed at 10/17/2020 0900 Gross per 24 hour  Intake 2034 ml  Output 4000 ml  Net -1966 ml   [employer]  10/16/20 0500 10/16/20 1014 10/16/20 1250  Weight: 74.9 kg 76.1 kg 72.1 kg    Examination:  General exam: Chronically ill young male sitting up in bed, AAOx3, more awake today, no distress HEENT: No JVD CVS: S1-S2, regular rate rhythm Lungs: Diminished breath sounds at the bases Abdomen: Soft, nontender, bowel sounds present Extremities: Left AKA with dressing   Data Reviewed: I have personally reviewed following labs and imaging studies  CBC: Recent Labs  Lab 10/11/20 1403 10/14/20 1020 10/14/20 1153 10/15/20 0327 10/16/20 0256 10/16/20 2323 10/17/20 0647  WBC 8.0 9.6  --   9.6 10.2  --  10.2  NEUTROABS  --  6.9  --   --   --   --   --   HGB 7.5* 7.6* 8.2* 7.1* 6.6* 7.4* 7.2*  HCT 24.7* 24.3* 24.0* 24.2* 22.1* 23.6* 24.3*  MCV 97.6 96.0  --  97.6 97.4  --  96.0  PLT 242 252  --  238 196  --  124   Basic Metabolic Panel: Recent Labs  Lab 10/11/20 1230 10/14/20 1153 10/14/20 1807 10/15/20 0327 10/15/20 0842 10/16/20 0256 10/17/20 0647  NA 131*   < > 129* 130* 132* 134* 133*  K 6.9*   < > 6.1* 6.8* 5.0 6.0* 5.4*  CL 92*   < > 93* 91* 95* 96* 97*  CO2 27   < > 21* 25 23 23 24   GLUCOSE 162*   < > 322* 245* 371* 125* 231*  BUN 63*   < > 73* 77* 35* 44* 35*  CREATININE 7.50*   < > 9.33* 9.61* 5.99* 7.26* 6.56*  CALCIUM 9.1   < > 8.1* 7.7* 7.8* 7.4* 7.4*  PHOS 5.7*  --   --   --   --   --   --    < > = values in this interval not displayed.   GFR: Estimated Creatinine Clearance: 13.9 mL/min (A) (by C-G formula based on SCr of 6.56 mg/dL (H)). Liver Function Tests: Recent Labs  Lab 10/11/20 1230  ALBUMIN 2.5*   No results for input(s): LIPASE, AMYLASE in the last 168 hours. No results for input(s): AMMONIA in the last 168 hours. Coagulation Profile: No results for input(s): INR, PROTIME in the last 168 hours. Cardiac Enzymes: No results for input(s): CKTOTAL, CKMB, CKMBINDEX, TROPONINI in the last 168 hours. BNP (last 3 results) No results for input(s): PROBNP in the last 8760 hours. HbA1C: No results for input(s): HGBA1C in the last 72 hours. CBG: Recent Labs  Lab 10/16/20 1345 10/16/20 1654 10/16/20 2004 10/17/20 0533 10/17/20 0916  GLUCAP 157* 275* 261* 184* 208*   Lipid Profile: No results for input(s): CHOL, HDL, LDLCALC, TRIG, CHOLHDL, LDLDIRECT in the last 72 hours. Thyroid Function Tests: No results for input(s): TSH, T4TOTAL, FREET4, T3FREE, THYROIDAB in the last 72 hours. Anemia Panel: Recent Labs    10/14/20 1807  FERRITIN 1,021*  TIBC 284  IRON 20*   Sepsis Labs: No results for input(s): PROCALCITON, LATICACIDVEN  in the last 168 hours.  Recent Results (from the past 240 hour(s))  Resp Panel by RT-PCR (Flu A&B, Covid) Nasopharyngeal Swab     Status: None   Collection Time: 10/14/20  8:37 AM   Specimen: Nasopharyngeal Swab; Nasopharyngeal(NP) swabs in vial transport medium  Result Value Ref Range Status   SARS Coronavirus 2 by RT PCR NEGATIVE NEGATIVE Final    Comment: (NOTE) SARS-CoV-2 target nucleic acids are NOT DETECTED.  The SARS-CoV-2 RNA is  generally detectable in upper respiratory specimens during the acute phase of infection. The lowest concentration of SARS-CoV-2 viral copies this assay can detect is 138 copies/mL. A negative result does not preclude SARS-Cov-2 infection and should not be used as the sole basis for treatment or other patient management decisions. A negative result may occur with  improper specimen collection/handling, submission of specimen other than nasopharyngeal swab, presence of viral mutation(s) within the areas targeted by this assay, and inadequate number of viral copies(<138 copies/mL). A negative result must be combined with clinical observations, patient history, and epidemiological information. The expected result is Negative.  Fact Sheet for Patients:  EntrepreneurPulse.com.au  Fact Sheet for Healthcare Providers:  IncredibleEmployment.be  This test is no t yet approved or cleared by the [place] FDA and  has been authorized for detection and/or diagnosis of SARS-CoV-2 by FDA under an Emergency Use Authorization (EUA). This EUA will remain  in effect (meaning this test can be used) for the duration of the COVID-19 declaration under Section 564(b)(1) of the Act, 21 U.S.C.section 360bbb-3(b)(1), unless the authorization is terminated  or revoked sooner.       Influenza A by PCR NEGATIVE NEGATIVE Final   Influenza B by PCR NEGATIVE NEGATIVE Final    Comment: (NOTE) The Xpert Xpress SARS-CoV-2/FLU/RSV plus  assay is intended as an aid in the diagnosis of influenza from Nasopharyngeal swab specimens and should not be used as a sole basis for treatment. Nasal washings and aspirates are unacceptable for Xpert Xpress SARS-CoV-2/FLU/RSV testing.  Fact Sheet for Patients: EntrepreneurPulse.com.au  Fact Sheet for Healthcare Providers: IncredibleEmployment.be  This test is not yet approved or cleared by the [place] FDA and has been authorized for detection and/or diagnosis of SARS-CoV-2 by FDA under an Emergency Use Authorization (EUA). This EUA will remain in effect (meaning this test can be used) for the duration of the COVID-19 declaration under Section 564(b)(1) of the Act, 21 U.S.C. section 360bbb-3(b)(1), unless the authorization is terminated or revoked.  Performed at [facility], [place] [address] [place], [place] [address]   Surgical pcr screen     Status: None   Collection Time: 10/15/20  4:36 AM   Specimen: Nasal Mucosa; Nasal Swab  Result Value Ref Range Status   MRSA, PCR NEGATIVE NEGATIVE Final   Staphylococcus aureus NEGATIVE NEGATIVE Final    Comment: (NOTE) The Xpert SA Assay (FDA approved for NASAL specimens in patients 32 years of age and older), is one component of a comprehensive surveillance program. It is not intended to diagnose infection nor to guide or monitor treatment. Performed at [facility], [place] [address] [place], [place] [address]      Radiology Studies: No results found.  Scheduled Meds: . (feeding supplement) PROSource Plus  30 mL Oral TID BM  . sodium chloride   Intravenous Once  . calcitRIOL  2.5 mcg Oral Q M,W,F-HD  . Chlorhexidine Gluconate Cloth  6 each Topical Daily  . Chlorhexidine Gluconate Cloth  6 each Topical Q0600  . cinacalcet  60 mg Oral BID WC  . [START ON 10/18/2020] darbepoetin (ARANESP) injection - DIALYSIS  150 mcg Intravenous Q Fri-HD  . heparin  5,000 Units Subcutaneous  Q12H  . HYDROmorphone   Intravenous Q4H  . insulin aspart  0-6 Units Subcutaneous TID WC  . insulin aspart  3 Units Subcutaneous TID WC  . polyethylene glycol  17 g Oral Daily  . senna-docusate  1 tablet Oral BID  . sevelamer  carbonate  4,000 mg Oral TID WC  . sodium zirconium cyclosilicate  5 g Oral Daily   Continuous Infusions: . [START ON 10/18/2020] ferric gluconate (FERRLECIT/NULECIT) IV       LOS: 3 days    Time spent: 25 minutes.   Domenic Polite, MD [facility]  10/17/2020, 11:32 AM

## 2020-10-17 NOTE — Plan of Care (Signed)
  Problem: Education: Goal: Knowledge of General Education information will improve Description: Including pain rating scale, medication(s)/side effects and non-pharmacologic comfort measures Outcome: Progressing   Problem: Health Behavior/Discharge Planning: Goal: Ability to manage health-related needs will improve Outcome: Progressing   Problem: Pain Managment: Goal: General experience of comfort will improve Outcome: Not Progressing

## 2020-10-17 NOTE — Plan of Care (Signed)
  Problem: Pain Managment: Goal: General experience of comfort will improve Outcome: Progressing   Problem: Education: Goal: Knowledge of General Education information will improve Description: Including pain rating scale, medication(s)/side effects and non-pharmacologic comfort measures Outcome: Progressing   

## 2020-10-17 NOTE — Progress Notes (Addendum)
  Progress Note    10/17/2020 7:38 AM 2 Days Post-Op  Subjective:  Complains of pain   Vitals:   10/17/20 0357 10/17/20 0500  BP:  137/77  Pulse:  96  Resp: 18 18  Temp:  99.5 F (37.5 C)  SpO2: 98%     Physical Exam: Incisions:  L AKA incision with viable skin edges, no drainage, no palpable hematoma   CBC    Component Value Date/Time   WBC 10.2 10/16/2020 0256   RBC 2.27 (L) 10/16/2020 0256   HGB 7.4 (L) 10/16/2020 2323   HGB 9.7 (L) 08/22/2014 0447   HCT 23.6 (L) 10/16/2020 2323   HCT 30.0 (L) 08/22/2014 0447   PLT 196 10/16/2020 0256   PLT 158 08/22/2014 0447   MCV 97.4 10/16/2020 0256   MCV 97 08/22/2014 0447   MCH 29.1 10/16/2020 0256   MCHC 29.9 (L) 10/16/2020 0256   RDW 15.9 (H) 10/16/2020 0256   RDW 15.3 (H) 08/22/2014 0447   LYMPHSABS 1.3 10/14/2020 1020   LYMPHSABS 1.7 08/22/2014 0447   MONOABS 0.7 10/14/2020 1020   MONOABS 0.8 08/22/2014 0447   EOSABS 0.5 10/14/2020 1020   EOSABS 0.4 08/22/2014 0447   BASOSABS 0.1 10/14/2020 1020   BASOSABS 0.1 08/22/2014 0447    BMET    Component Value Date/Time   NA 134 (L) 10/16/2020 0256   NA 138 08/22/2014 0447   K 6.0 (H) 10/16/2020 0256   K 4.0 08/22/2014 0447   CL 96 (L) 10/16/2020 0256   CL 100 08/22/2014 0447   CO2 23 10/16/2020 0256   CO2 30 08/22/2014 0447   GLUCOSE 125 (H) 10/16/2020 0256   GLUCOSE 88 08/22/2014 0447   BUN 44 (H) 10/16/2020 0256   BUN 14 08/22/2014 0447   CREATININE 7.26 (H) 10/16/2020 0256   CREATININE 5.00 (H) 08/22/2014 0447   CALCIUM 7.4 (L) 10/16/2020 0256   CALCIUM 8.2 (L) 08/22/2014 0447   GFRNONAA 9 (L) 10/16/2020 0256   GFRNONAA 14 (L) 08/22/2014 0447   GFRNONAA 5 (L) 06/19/2014 0937   GFRAA 7 (L) 05/30/2020 1923   GFRAA 17 (L) 08/22/2014 0447   GFRAA 6 (L) 06/19/2014 0937    INR    Component Value Date/Time   INR 1.2 07/22/2020 0618     Intake/Output Summary (Last 24 hours) at 10/17/2020 0738 Last data filed at 10/17/2020 0600 Gross per 24 hour   Intake 2269 ml  Output 4000 ml  Net -1731 ml     Assessment/Plan:  40 y.o. male is s/p left above knee amputation  2 Days Post-Op  - L AKA incision healing well - stump sock ordered - Encouraged nutrition and participation with PT/OT - Staples out in 4-6 weeks   Dagoberto Ligas, PA-C [facility] [phone] 10/17/2020 7:38 AM   Agree with above less pain than yesterday still on PCA hopefully wean off next day or so Home when off IV pain medication  Ruta Hinds, MD [facility] [place] Office: [phone]

## 2020-10-18 LAB — CBC
HCT: 23.6 % — ABNORMAL LOW (ref 39.0–52.0)
Hemoglobin: 7.2 g/dL — ABNORMAL LOW (ref 13.0–17.0)
MCH: 28.9 pg (ref 26.0–34.0)
MCHC: 30.5 g/dL (ref 30.0–36.0)
MCV: 94.8 fL (ref 80.0–100.0)
Platelets: 261 10*3/uL (ref 150–400)
RBC: 2.49 MIL/uL — ABNORMAL LOW (ref 4.22–5.81)
RDW: 14.6 % (ref 11.5–15.5)
WBC: 9 10*3/uL (ref 4.0–10.5)
nRBC: 0 % (ref 0.0–0.2)

## 2020-10-18 LAB — BASIC METABOLIC PANEL
Anion gap: 13 (ref 5–15)
BUN: 53 mg/dL — ABNORMAL HIGH (ref 6–20)
CO2: 25 mmol/L (ref 22–32)
Calcium: 7.5 mg/dL — ABNORMAL LOW (ref 8.9–10.3)
Chloride: 95 mmol/L — ABNORMAL LOW (ref 98–111)
Creatinine, Ser: 8.4 mg/dL — ABNORMAL HIGH (ref 0.61–1.24)
GFR, Estimated: 8 mL/min — ABNORMAL LOW (ref >60)
Glucose, Bld: 189 mg/dL — ABNORMAL HIGH (ref 70–99)
Potassium: 5.6 mmol/L — ABNORMAL HIGH (ref 3.5–5.1)
Sodium: 133 mmol/L — ABNORMAL LOW (ref 135–145)

## 2020-10-18 LAB — GLUCOSE, CAPILLARY
Glucose-Capillary: 370 mg/dL — ABNORMAL HIGH (ref 70–99)
Glucose-Capillary: 127 mg/dL — ABNORMAL HIGH (ref 70–99)
Glucose-Capillary: 148 mg/dL — ABNORMAL HIGH (ref 70–99)

## 2020-10-18 LAB — SURGICAL PATHOLOGY

## 2020-10-18 MED ORDER — INSULIN DETEMIR 100 UNIT/ML ~~LOC~~ SOLN
10.0000 [IU] | Freq: Every day | SUBCUTANEOUS | Status: DC
  Filled 2020-10-18 (×2): qty 0.1

## 2020-10-18 MED ORDER — HEPARIN SODIUM (PORCINE) 1000 UNIT/ML IJ SOLN
INTRAMUSCULAR | Status: AC
  Filled 2020-10-18: qty 4

## 2020-10-18 NOTE — Progress Notes (Signed)
Patient curse at this RN. Stating why he did not get his insulin at the time when he first called. Try to explain to patient that we were doing shift change and could not be in there at the time. Patient proceed to curse at this RN. RN ask patient to please not curse at her. Patient proceeded in the same manner.

## 2020-10-18 NOTE — Progress Notes (Signed)
Physical Therapy Treatment Patient Details Name: GOVIND FUREY MRN: [id number] DOB: 12/23/1980 Today's Date: 10/18/2020    History of Present Illness OLEG OLESON is a 40 y.o. 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 Patient was discharged 4 days ago, and he claimed that since then he has been very unsteady and fell couple of times and yesterday he fell again and twisted the left leg, and stitches on the left BKA tore off in the morning tore open. Pt underwent L AKA on 1/18    PT Comments    Patient received in bed, pleasant and cooperative today with therapies, agreeable to getting into the chair but refused gait belt or being touched, so only able to provide supervision level guarding today. Able to perform bed mobility and transfer into recliner via stand pivot with RW with extended time, Sx2 for safety and line management without too much difficulty. Left up in recliner with all needs met, chair alarm active. Encouraged him to call for help when ready to get back to bed for safety given multiple lines and new AKA, patient agreeable.    Follow Up Recommendations  SNF;Supervision/Assistance - 24 hour;Other (comment) (ideally SNF, will need HHPT if he refuses)     Equipment Recommendations  Wheelchair (measurements PT);Wheelchair cushion (measurements PT) [facility] he was given is too wide to get thru doors in his house)    Recommendations for Other Services       Precautions / Restrictions Precautions Precautions: Fall Precaution Comments: high fall risk, new AKA Restrictions Weight Bearing Restrictions: Yes LLE Weight Bearing: Non weight bearing    Mobility  Bed Mobility Overal bed mobility: Needs Assistance Bed Mobility: Supine to Sit     Supine to sit: Supervision;HOB elevated     General bed mobility  comments: increased time due to pain but able to perform without physical assist  Transfers Overall transfer level: Needs assistance Equipment used: Rolling walker (2 wheeled) Transfers: Sit to/from Stand Sit to Stand: Supervision;+2 safety/equipment Stand pivot transfers: Supervision;+2 safety/equipment       General transfer comment: refused gait belt or min guard level guarding, able to come to standing, stabilize, and pivot into recliner with walker and close Sx2 for safety and line management  Ambulation/Gait             General Gait Details: deferred   Stairs             Wheelchair Mobility    Modified Rankin (Stroke Patients Only)       Balance Overall balance assessment: Needs assistance Sitting-balance support: No upper extremity supported Sitting balance-[name]: Good     Standing balance support: Bilateral upper extremity supported;During functional activity Standing balance-[name]: Poor Standing balance comment: reliant on UE support, but does get mildly unstable without RW                            Cognition Arousal/Alertness: Awake/alert Behavior During Therapy: WFL for tasks assessed/performed Overall Cognitive Status: Impaired/Different from baseline Area of Impairment: Safety/judgement;Problem solving                   Current Attention Level: Selective Memory: Decreased short-term memory   Safety/Judgement: Decreased awareness of safety;Decreased awareness of deficits Awareness: Emergent Problem Solving: Requires verbal cues;Difficulty sequencing General Comments: pleasant and cooperative but with  reduced safety awareness- refused gait belt or close guarding from therapist      Exercises      General Comments        Pertinent Vitals/Pain Pain Assessment: Faces Faces Pain Scale: Hurts little more Pain Location: L LE Pain Descriptors / Indicators: Aching;Sore Pain Intervention(s): Limited activity  within patient's tolerance;Monitored during session;Repositioned;PCA encouraged    Home Living                      Prior Function            PT Goals (current goals can now be found in the care plan section) Acute Rehab PT Goals Patient Stated Goal: to get a prosthetic limb PT Goal Formulation: With patient Time For Goal Achievement: 10/30/20 Potential to Achieve Goals: Fair Additional Goals Additional Goal #1: pt will be able to set up and perform transfer to w/c from bed and safety propel w/c 150 ft without assist Progress towards PT goals: Progressing toward goals    Frequency    Min 3X/week      PT Plan Current plan remains appropriate    Co-evaluation              AM-PAC PT "6 Clicks" Mobility   Outcome Measure  Help needed turning from your back to your side while in a flat bed without using bedrails?: None Help needed moving from lying on your back to sitting on the side of a flat bed without using bedrails?: A Little Help needed moving to and from a bed to a chair (including a wheelchair)?: A Little Help needed standing up from a chair using your arms (e.g., wheelchair or bedside chair)?: A Little Help needed to walk in hospital room?: A Little Help needed climbing 3-5 steps with a railing? : A Lot 6 Click Score: 18    End of Session Equipment Utilized During Treatment: Other (comment) (refused gait belt or being touched) Activity Tolerance: Patient tolerated treatment well Patient left: in chair;with call bell/phone within reach;with chair alarm set Nurse Communication: Mobility status PT Visit Diagnosis: Unsteadiness on feet (R26.81);Other abnormalities of gait and mobility (R26.89);Muscle weakness (generalized) (M62.81);Difficulty in walking, not elsewhere classified (R26.2);Pain Pain - Right/Left: Left Pain - part of body: Leg     Time: 1165-7903 PT Time Calculation (min) (ACUTE ONLY): 24 min  Charges:  $Therapeutic Activity: 23-37  mins                     Windell Norfolk, DPT, PN1   Supplemental Physical Therapist [place]    Pager [phone] Acute Rehab Office [phone]

## 2020-10-18 NOTE — Progress Notes (Addendum)
[facility] [place]  Subjective  - Complaints of pain and still using the PCA.   Objective 132/68 92 98.8 F (37.1 C) (Oral) 18 100%  Intake/Output Summary (Last 24 hours) at 10/18/2020 0758 Last data filed at 10/18/2020 0400 Gross per 24 hour  Intake 1860 ml  Output 0 ml  Net 1860 ml    Left AKA stump healing well   Viable  Assessment/Planning: POD # 3 revision left AKA Stump appears viable Staples will be maintained for 4 weeks Stump sock ordered.   Roxy Horseman 10/18/2020 7:58 AM --  Agree with above.  He is agreeable to d/c of PCA tomorrow. Home when pain controlled.  Ruta Hinds, MD [facility] [place] Office: [phone]   Laboratory Lab Results: Recent Labs    10/16/20 0256 10/16/20 2323 10/17/20 0647  WBC 10.2  --  10.2  HGB 6.6* 7.4* 7.2*  HCT 22.1* 23.6* 24.3*  PLT 196  --  208   BMET Recent Labs    10/16/20 0256 10/17/20 0647  NA 134* 133*  K 6.0* 5.4*  CL 96* 97*  CO2 23 24  GLUCOSE 125* 231*  BUN 44* 35*  CREATININE 7.26* 6.56*  CALCIUM 7.4* 7.4*    COAG Lab Results  Component Value Date   INR 1.2 07/22/2020   INR 1.0 05/30/2020   INR 1.2 04/24/2019   No results found for: PTT

## 2020-10-18 NOTE — Progress Notes (Signed)
Patient refuses his 12pm CBG check. Patient states that he will call when he is ready.

## 2020-10-18 NOTE — Progress Notes (Signed)
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 -Recently hospitalized and underwent left BKA on 08/2020 for left foot and ankle osteomyelitis.  Had a long hospital stay, when he decided to leave AMA  on 1/15.  -Back to the ED 1/16 with dehiscence of his left BKA after a fall, missed hemodialysis and hyperkalemia -Long history of noncompliance  Assessment & Plan:   Left BKA wound dehiscence: -Recent BKA, fell multiples times at home. Presented with wound dehsicence.  -Underwent AKA 1/18. -Started on Dilaudid PCA per [facility],  -Extremely difficult to reason with, after much discussion finally agreeable to wean off PCA tomorrow -PT OT, adamantly declines rehab -TOC consulted for home health services and wheelchair  ESRD on hemodialysis Hyperkalemia -Noncompliant with diet or fluid restriction -Continue Lokelma, HD per renal  Acute blood loss anemia -Hemoglobin dropped to 6.6 secondary to Intra-Op blood loss and anemia of chronic disease, transfused 1 unit of PRBC -Recent anemia panel with iron deficiency and signs of chronic disease -Continue iron and EPO with HD -Repeat labs pending  Insulin-dependent type 2 diabetes mellitus -CBGs poorly controlled, add 10 units of Levemir, continue NovoLog with meals  Narcotic Dependence:  -On Percocet at baseline, currently on a Dilaudid PCA -After multiple discussions and disagreements patient finally agrees to wean this off tomorrow  Hyponatremia; -Resolved  Pressure Injury 10/14/20 Wrist Left;Anterior Stage 2 -  Partial thickness loss of dermis presenting as a shallow open injury with a red, pink wound bed without  slough. (Active)  10/14/20 2100  Location: Wrist  Location Orientation: Left;Anterior  Staging: Stage 2 -  Partial thickness loss of dermis presenting as a shallow open injury with a red, pink wound bed without slough.  Wound Description (Comments):   Present on Admission: Yes   DVT prophylaxis: Heparin subcutaneous Code Status: Full code Family Communication: mother on speaker phone while I was in the room Disposition Plan:  Status is: Inpatient  Remains inpatient appropriate because:Hemodynamically unstable   Dispo: The patient is from: Home              Anticipated d/c is to: to be determined              Anticipated d/c date is: 2 days              Patient currently is not medically stable to d/c.  Consultants:   Vascular.,  Nephrology   Procedures: Left above-knee amputation 1/18  Antimicrobials:    Subjective: -Complains about pain, unhappy with staff  Objective: Vitals:   10/17/20 2342 10/18/20 0417 10/18/20 0750 10/18/20 1006  BP:    134/78  Pulse:    (!) 102  Resp: 18 18 18 19   Temp:    98.1 F (36.7 C)  TempSrc:    Oral  SpO2: 100% 99% 100% 97%  Weight:      Height:        Intake/Output Summary (Last 24 hours) at 10/18/2020 1131 Last data filed at 10/18/2020 1100 Gross per 24 hour  Intake 2100 ml  Output 0 ml  Net 2100 ml   Filed Weights   10/16/20 1014 10/16/20 1250 10/17/20 2032  Weight: 76.1 kg 72.1 kg 72.1 kg  Examination:  General exam: Chronically ill young male sitting up in bed, AAOx3, no distress HEENT: No JVD CVS: S1-S2, regular rate rhythm Lungs: Diminished breath sounds at the bases Abdomen: Soft, nontender, bowel sounds present Extremities: Left AKA with dressing   Data Reviewed: I have personally reviewed following labs and imaging studies  CBC: Recent Labs  Lab 10/11/20 1403 10/14/20 1020 10/14/20 1153 10/15/20 0327 10/16/20 0256 10/16/20 2323 10/17/20 0647  WBC 8.0 9.6  --  9.6 10.2  --  10.2  NEUTROABS  --   6.9  --   --   --   --   --   HGB 7.5* 7.6* 8.2* 7.1* 6.6* 7.4* 7.2*  HCT 24.7* 24.3* 24.0* 24.2* 22.1* 23.6* 24.3*  MCV 97.6 96.0  --  97.6 97.4  --  96.0  PLT 242 252  --  238 196  --  637   Basic Metabolic Panel: Recent Labs  Lab 10/11/20 1230 10/14/20 1153 10/14/20 1807 10/15/20 0327 10/15/20 0842 10/16/20 0256 10/17/20 0647  NA 131*   < > 129* 130* 132* 134* 133*  K 6.9*   < > 6.1* 6.8* 5.0 6.0* 5.4*  CL 92*   < > 93* 91* 95* 96* 97*  CO2 27   < > 21* 25 23 23 24   GLUCOSE 162*   < > 322* 245* 371* 125* 231*  BUN 63*   < > 73* 77* 35* 44* 35*  CREATININE 7.50*   < > 9.33* 9.61* 5.99* 7.26* 6.56*  CALCIUM 9.1   < > 8.1* 7.7* 7.8* 7.4* 7.4*  PHOS 5.7*  --   --   --   --   --   --    < > = values in this interval not displayed.   GFR: Estimated Creatinine Clearance: 13.9 mL/min (A) (by C-G formula based on SCr of 6.56 mg/dL (H)). Liver Function Tests: Recent Labs  Lab 10/11/20 1230  ALBUMIN 2.5*   No results for input(s): LIPASE, AMYLASE in the last 168 hours. No results for input(s): AMMONIA in the last 168 hours. Coagulation Profile: No results for input(s): INR, PROTIME in the last 168 hours. Cardiac Enzymes: No results for input(s): CKTOTAL, CKMB, CKMBINDEX, TROPONINI in the last 168 hours. BNP (last 3 results) No results for input(s): PROBNP in the last 8760 hours. HbA1C: No results for input(s): HGBA1C in the last 72 hours. CBG: Recent Labs  Lab 10/17/20 0916 10/17/20 1157 10/17/20 1630 10/17/20 2033 10/18/20 0700  GLUCAP 208* 238* 221* 175* 370*   Lipid Profile: No results for input(s): CHOL, HDL, LDLCALC, TRIG, CHOLHDL, LDLDIRECT in the last 72 hours. Thyroid Function Tests: No results for input(s): TSH, T4TOTAL, FREET4, T3FREE, THYROIDAB in the last 72 hours. Anemia Panel: No results for input(s): VITAMINB12, FOLATE, FERRITIN, TIBC, IRON, RETICCTPCT in the last 72 hours. Sepsis Labs: No results for input(s): PROCALCITON, LATICACIDVEN in the last  168 hours.  Recent Results (from the past 240 hour(s))  Resp Panel by RT-PCR (Flu A&B, Covid) Nasopharyngeal Swab     Status: None   Collection Time: 10/14/20  8:37 AM   Specimen: Nasopharyngeal Swab; Nasopharyngeal(NP) swabs in vial transport medium  Result Value Ref Range Status   SARS Coronavirus 2 by RT PCR NEGATIVE NEGATIVE Final    Comment: (NOTE) SARS-CoV-2 target nucleic acids are NOT DETECTED.  The SARS-CoV-2 RNA is generally detectable in upper respiratory specimens during the acute phase of infection. The lowest concentration of SARS-CoV-2 viral copies this assay can detect  is 138 copies/mL. A negative result does not preclude SARS-Cov-2 infection and should not be used as the sole basis for treatment or other patient management decisions. A negative result may occur with  improper specimen collection/handling, submission of specimen other than nasopharyngeal swab, presence of viral mutation(s) within the areas targeted by this assay, and inadequate number of viral copies(<138 copies/mL). A negative result must be combined with clinical observations, patient history, and epidemiological information. The expected result is Negative.  Fact Sheet for Patients:  EntrepreneurPulse.com.au  Fact Sheet for Healthcare Providers:  IncredibleEmployment.be  This test is no t yet approved or cleared by the [place] FDA and  has been authorized for detection and/or diagnosis of SARS-CoV-2 by FDA under an Emergency Use Authorization (EUA). This EUA will remain  in effect (meaning this test can be used) for the duration of the COVID-19 declaration under Section 564(b)(1) of the Act, 21 U.S.C.section 360bbb-3(b)(1), unless the authorization is terminated  or revoked sooner.       Influenza A by PCR NEGATIVE NEGATIVE Final   Influenza B by PCR NEGATIVE NEGATIVE Final    Comment: (NOTE) The Xpert Xpress SARS-CoV-2/FLU/RSV plus assay is  intended as an aid in the diagnosis of influenza from Nasopharyngeal swab specimens and should not be used as a sole basis for treatment. Nasal washings and aspirates are unacceptable for Xpert Xpress SARS-CoV-2/FLU/RSV testing.  Fact Sheet for Patients: EntrepreneurPulse.com.au  Fact Sheet for Healthcare Providers: IncredibleEmployment.be  This test is not yet approved or cleared by the [place] FDA and has been authorized for detection and/or diagnosis of SARS-CoV-2 by FDA under an Emergency Use Authorization (EUA). This EUA will remain in effect (meaning this test can be used) for the duration of the COVID-19 declaration under Section 564(b)(1) of the Act, 21 U.S.C. section 360bbb-3(b)(1), unless the authorization is terminated or revoked.  Performed at [facility], [place] [address], [place], [place] [address]   Surgical pcr screen     Status: None   Collection Time: 10/15/20  4:36 AM   Specimen: Nasal Mucosa; Nasal Swab  Result Value Ref Range Status   MRSA, PCR NEGATIVE NEGATIVE Final   Staphylococcus aureus NEGATIVE NEGATIVE Final    Comment: (NOTE) The Xpert SA Assay (FDA approved for NASAL specimens in patients 76 years of age and older), is one component of a comprehensive surveillance program. It is not intended to diagnose infection nor to guide or monitor treatment. Performed at [facility], [place] [address] [place], [place] [address]      Radiology Studies: No results found.  Scheduled Meds:  (feeding supplement) PROSource Plus  30 mL Oral TID BM   sodium chloride   Intravenous Once   calcitRIOL  2.5 mcg Oral Q M,W,F-HD   Chlorhexidine Gluconate Cloth  6 each Topical Daily   Chlorhexidine Gluconate Cloth  6 each Topical Q0600   cinacalcet  60 mg Oral BID WC   darbepoetin (ARANESP) injection - DIALYSIS  150 mcg Intravenous Q Fri-HD   heparin  5,000 Units Subcutaneous Q12H   HYDROmorphone    Intravenous Q4H   insulin aspart  0-6 Units Subcutaneous TID WC   insulin aspart  5 Units Subcutaneous TID WC   polyethylene glycol  17 g Oral Daily   senna-docusate  1 tablet Oral BID   sevelamer carbonate  4,000 mg Oral TID WC   sodium zirconium cyclosilicate  10 g Oral Daily   Continuous Infusions:  ferric gluconate (FERRLECIT/NULECIT) IV  LOS: 4 days    Time spent: 25 minutes.   Domenic Polite, MD [facility]  10/18/2020, 11:31 AM

## 2020-10-18 NOTE — Telephone Encounter (Signed)
Abigail Butts nurse case manager has sent forms to [employer]. Just an FYI.

## 2020-10-18 NOTE — Care Management Important Message (Signed)
Important Message  Patient Details  Name: [name] MRN: [id number] Date of Birth: [birth year]   Medicare Important Message Given:  Yes     [name] [name] 10/18/2020, 2:37 PM

## 2020-10-18 NOTE — Progress Notes (Signed)
[facility] Progress Note   Subjective:   Patient seen and examined at bedside.  Reports pain in stump. Otherwise feeling ok.  No CP, SOB, abdominal pain, or n/v/d.  Objective Vitals:   10/17/20 2342 10/18/20 0417 10/18/20 0750 10/18/20 1006  BP:    134/78  Pulse:    (!) 102  Resp: 18 18 18 19   Temp:    98.1 F (36.7 C)  TempSrc:    Oral  SpO2: 100% 99% 100% 97%  Weight:      Height:       Physical Exam General:WDWN male in NAD Heart:RRR Lungs:CTAB Abdomen:soft, NTND Extremities:L AKA - no stump edema, RLE no edema Dialysis Access: [facility]   Filed Weights   10/16/20 1014 10/16/20 1250 10/17/20 2032  Weight: 76.1 kg 72.1 kg 72.1 kg    Intake/Output Summary (Last 24 hours) at 10/18/2020 1013 Last data filed at 10/18/2020 0400 Gross per 24 hour  Intake 1620 ml  Output 0 ml  Net 1620 ml    Additional Objective Labs: Basic Metabolic Panel: Recent Labs  Lab 10/11/20 1230 10/14/20 1153 10/15/20 0842 10/16/20 0256 10/17/20 0647  NA 131*   < > 132* 134* 133*  K 6.9*   < > 5.0 6.0* 5.4*  CL 92*   < > 95* 96* 97*  CO2 27   < > 23 23 24   GLUCOSE 162*   < > 371* 125* 231*  BUN 63*   < > 35* 44* 35*  CREATININE 7.50*   < > 5.99* 7.26* 6.56*  CALCIUM 9.1   < > 7.8* 7.4* 7.4*  PHOS 5.7*  --   --   --   --    < > = values in this interval not displayed.   Liver Function Tests: Recent Labs  Lab 10/11/20 1230  ALBUMIN 2.5*  CBC: Recent Labs  Lab 10/11/20 1403 10/14/20 1020 10/14/20 1153 10/15/20 0327 10/16/20 0256 10/16/20 2323 10/17/20 0647  WBC 8.0 9.6  --  9.6 10.2  --  10.2  NEUTROABS  --  6.9  --   --   --   --   --   HGB 7.5* 7.6*   < > 7.1* 6.6* 7.4* 7.2*  HCT 24.7* 24.3*   < > 24.2* 22.1* 23.6* 24.3*  MCV 97.6 96.0  --  97.6 97.4  --  96.0  PLT 242 252  --  238 196  --  208   < > = values in this interval not displayed.   CBG: Recent Labs  Lab 10/17/20 0916 10/17/20 1157 10/17/20 1630 10/17/20 2033 10/18/20 0700  GLUCAP 208*  238* 221* 175* 370*    Medications: . ferric gluconate (FERRLECIT/NULECIT) IV     . (feeding supplement) PROSource Plus  30 mL Oral TID BM  . sodium chloride   Intravenous Once  . calcitRIOL  2.5 mcg Oral Q M,W,F-HD  . Chlorhexidine Gluconate Cloth  6 each Topical Daily  . Chlorhexidine Gluconate Cloth  6 each Topical Q0600  . cinacalcet  60 mg Oral BID WC  . darbepoetin (ARANESP) injection - DIALYSIS  150 mcg Intravenous Q Fri-HD  . heparin  5,000 Units Subcutaneous Q12H  . HYDROmorphone   Intravenous Q4H  . insulin aspart  0-6 Units Subcutaneous TID WC  . insulin aspart  5 Units Subcutaneous TID WC  . polyethylene glycol  17 g Oral Daily  . senna-docusate  1 tablet Oral BID  . sevelamer carbonate  4,000 mg Oral  TID WC  . sodium zirconium cyclosilicate  10 g Oral Daily    Dialysis Orders: MWF @ [facility] 4h 67min 400/800 67kg TDC Hep 5000 + 5000 midrun - Calcitriol 53mcg PO q HD - No ESA Noted scheduled for R BC AVF on 09/24/20, canceled/delayed per patient request.  Problem/Plan: 1. Hyperkalemia-ongoing issue with diet noncompliance, last K5.4, using low K bath. Lokelma ordered daily. Provided education about low K diet and importance of following. Patient acknowledged.  2. Left BKA wound dehiscence-AKA completed 1/18 by Dr. Oneida Alar. Appears viable per VVS 3. ESRD -HD MWF-HD today per regular schedule. Truncated treatment due to large patient volume/staffing issues.  4. Anemia -Hgbdrop 7.2.give Aranesp 150 qFridays.1 unit pRBC given yesterday. tsat 7%, IV iron ordered. 5. Secondary hyperparathyroidism - corrected calcium at goal last phosphorus 5.7,continue binders, VDRA and sensipar 6. HTN/volume -BP at goal.Does not appear grossly volume overloaded.  UF as tolerated.  7. Nutrition - last albumin 2.5. Patient refuses renal carb diet always asked for regular. Provided education about following renal diet.  8. Bipolar disorder underlying personality disorder  -Meds Per admit 9. Type II DM - chronically uncontrolled plan Per admit   Jen Mow, PA-C [facility] 10/18/2020,10:13 AM  LOS: 4 days

## 2020-10-19 LAB — CBC
HCT: 25.3 % — ABNORMAL LOW (ref 39.0–52.0)
Hemoglobin: 7.6 g/dL — ABNORMAL LOW (ref 13.0–17.0)
MCH: 28.5 pg (ref 26.0–34.0)
MCHC: 30 g/dL (ref 30.0–36.0)
MCV: 94.8 fL (ref 80.0–100.0)
Platelets: 231 10*3/uL (ref 150–400)
RBC: 2.67 MIL/uL — ABNORMAL LOW (ref 4.22–5.81)
RDW: 14.7 % (ref 11.5–15.5)
WBC: 8.2 10*3/uL (ref 4.0–10.5)
nRBC: 0 % (ref 0.0–0.2)

## 2020-10-19 LAB — BASIC METABOLIC PANEL
Anion gap: 13 (ref 5–15)
BUN: 42 mg/dL — ABNORMAL HIGH (ref 6–20)
CO2: 23 mmol/L (ref 22–32)
Calcium: 7.5 mg/dL — ABNORMAL LOW (ref 8.9–10.3)
Chloride: 95 mmol/L — ABNORMAL LOW (ref 98–111)
Creatinine, Ser: 6.78 mg/dL — ABNORMAL HIGH (ref 0.61–1.24)
GFR, Estimated: 10 mL/min — ABNORMAL LOW (ref >60)
Glucose, Bld: 323 mg/dL — ABNORMAL HIGH (ref 70–99)
Potassium: 5.4 mmol/L — ABNORMAL HIGH (ref 3.5–5.1)
Sodium: 131 mmol/L — ABNORMAL LOW (ref 135–145)

## 2020-10-19 LAB — GLUCOSE, CAPILLARY
Glucose-Capillary: 148 mg/dL — ABNORMAL HIGH (ref 70–99)
Glucose-Capillary: 297 mg/dL — ABNORMAL HIGH (ref 70–99)
Glucose-Capillary: 316 mg/dL — ABNORMAL HIGH (ref 70–99)
Glucose-Capillary: 485 mg/dL — ABNORMAL HIGH (ref 70–99)
Glucose-Capillary: 88 mg/dL (ref 70–99)

## 2020-10-19 MED ORDER — INSULIN DETEMIR 100 UNIT/ML ~~LOC~~ SOLN
10.0000 [IU] | Freq: Every day | SUBCUTANEOUS | Status: DC
  Filled 2020-10-19 (×2): qty 0.1

## 2020-10-19 MED ORDER — HYDROMORPHONE HCL 1 MG/ML IJ SOLN
1.0000 mg | INTRAMUSCULAR | Status: DC | PRN
  Administered 2020-10-19: 1 mg via INTRAVENOUS
  Filled 2020-10-19: qty 1

## 2020-10-19 MED ORDER — INSULIN ASPART 100 UNIT/ML ~~LOC~~ SOLN
6.0000 [IU] | Freq: Three times a day (TID) | SUBCUTANEOUS | Status: DC
  Administered 2020-10-19 – 2020-10-20 (×4): 6 [IU] via SUBCUTANEOUS
  Administered 2020-10-20: 4 [IU] via SUBCUTANEOUS
  Administered 2020-10-21 (×3): 6 [IU] via SUBCUTANEOUS

## 2020-10-19 MED ORDER — HYDROMORPHONE HCL 1 MG/ML IJ SOLN
1.0000 mg | INTRAMUSCULAR | Status: DC | PRN
  Administered 2020-10-19 – 2020-10-21 (×15): 1 mg via INTRAVENOUS
  Filled 2020-10-19 (×15): qty 1

## 2020-10-19 MED ORDER — OXYCODONE-ACETAMINOPHEN 5-325 MG PO TABS
1.0000 | ORAL_TABLET | Freq: Four times a day (QID) | ORAL | Status: DC | PRN
  Administered 2020-10-19 – 2020-10-21 (×6): 2 via ORAL
  Filled 2020-10-19 (×6): qty 2

## 2020-10-19 NOTE — Progress Notes (Signed)
[facility] Progress Note   Subjective:  Seen in room - dialyzed yesterday, he feels like he is still volume up a little - asking for another HD today. Unfortunately, I do not see that happening given high census/staffing shortage. He is on room air.   Objective Vitals:   10/18/20 2251 10/18/20 2336 10/19/20 0000 10/19/20 0008  BP: (!) 124/93 103/61  (!) 118/51  Pulse: (!) 105 (!) 109  (!) 108  Resp: 20 20 20 18   Temp:  98.8 F (37.1 C)  100.2 F (37.9 C)  TempSrc:  Oral  Oral  SpO2: 99% 94% 100% 92%  Weight:      Height:       Physical Exam General: Well appearing man, NAD. Room air. Heart: RRR; no murmur Lungs:  CTAB; no rales Abdomen: soft, non-tender Extremities: L AKA bandaged, no RLE edema Dialysis Access: [facility]  Additional Objective Labs: Basic Metabolic Panel: Recent Labs  Lab 10/16/20 0256 10/17/20 0647 10/18/20 1305  NA 134* 133* 133*  K 6.0* 5.4* 5.6*  CL 96* 97* 95*  CO2 23 24 25   GLUCOSE 125* 231* 189*  BUN 44* 35* 53*  CREATININE 7.26* 6.56* 8.40*  CALCIUM 7.4* 7.4* 7.5*   CBC: Recent Labs  Lab 10/14/20 1020 10/14/20 1153 10/15/20 0327 10/16/20 0256 10/16/20 2323 10/17/20 0647 10/18/20 1305  WBC 9.6  --  9.6 10.2  --  10.2 9.0  NEUTROABS 6.9  --   --   --   --   --   --   HGB 7.6*   < > 7.1* 6.6* 7.4* 7.2* 7.2*  HCT 24.3*   < > 24.2* 22.1* 23.6* 24.3* 23.6*  MCV 96.0  --  97.6 97.4  --  96.0 94.8  PLT 252  --  238 196  --  208 261   < > = values in this interval not displayed.   CBG: Recent Labs  Lab 10/18/20 0700 10/18/20 1448 10/18/20 1813 10/19/20 0008 10/19/20 0639  GLUCAP 370* 127* 148* 148* 485*   Medications: . ferric gluconate (FERRLECIT/NULECIT) IV 125 mg (10/19/20 0012)   . (feeding supplement) PROSource Plus  30 mL Oral TID BM  . sodium chloride   Intravenous Once  . calcitRIOL  2.5 mcg Oral Q M,W,F-HD  . Chlorhexidine Gluconate Cloth  6 each Topical Daily  . Chlorhexidine Gluconate Cloth  6 each  Topical Q0600  . cinacalcet  60 mg Oral BID WC  . darbepoetin (ARANESP) injection - DIALYSIS  150 mcg Intravenous Q Fri-HD  . heparin  5,000 Units Subcutaneous Q12H  . insulin aspart  0-6 Units Subcutaneous TID WC  . insulin aspart  5 Units Subcutaneous TID WC  . insulin detemir  10 Units Subcutaneous Daily  . polyethylene glycol  17 g Oral Daily  . senna-docusate  1 tablet Oral BID  . sevelamer carbonate  4,000 mg Oral TID WC  . sodium zirconium cyclosilicate  10 g Oral Daily    Dialysis Orders: MWF @ [facility] 4h 11min 400/800 67kg TDC Hep 5000 + 5000 midrun - Calcitriol 11mcg PO q HD - No ESA Noted scheduled for R BC AVF on 09/24/20, canceled/delayed per patient request.  Problem/Plan: 1. Hyperkalemia-ongoing issue with diet noncompliance, last K5.6, using low K bath. Lokelma has been ordered daily, reminded on diet. 2. Left BKA wound dehiscence: S/p AKA 1/18by Dr. Oneida Alar. Using dilaudid PCA, primary attempting to wean. 3. ESRD: Usual MWF schedule, s/p HD yesterday.Remains about 5kg above EDW -  he is requesting extra HD. He is not requiring O2. Unfortunately, this is not feasible at the moment given high census/staff shortage. Will reassess him tomorrow. 4. Anemia: Hgb 7.2, continue Aranesp 150 q Fridays.1 unit pRBCgiven 1/20, tsat 7%,IV iron ordered. 5. Secondary hyperparathyroidism - corrected calcium at goallastphosphorus 5.7,continue binders, VDRA and sensipar 6. HTN/volume -BP at goal.Does not appear grossly volume overloaded. UF as tolerated.  7. Nutrition- lastalbumin 2.5. Patient refuses renal carb diet always asked for regular. Provided education about following renal diet.  8. Bipolar disorder underlying personality disorder-Meds Per admit 9. Type II DM - chronically uncontrolled plan Per admit   Veneta Penton, Hershal Coria 10/19/2020, 9:59 AM  [employer]

## 2020-10-19 NOTE — Progress Notes (Signed)
Patient wants bedside commode next to bed and to not put the bed alarm on. Educate and advise patient to call for help when transferring from bed to [facility]. Patient verbalized " I got this". Left the room with the call bell within reach

## 2020-10-19 NOTE — Plan of Care (Signed)

## 2020-10-19 NOTE — Progress Notes (Signed)
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 -Recently hospitalized and underwent left BKA on 08/2020 for left foot and ankle osteomyelitis.  Had a long hospital stay, when he decided to leave AMA  on 1/15.  -Back to the ED 1/16 with dehiscence of his left BKA after a fall, missed hemodialysis and hyperkalemia -Long history of noncompliance  Assessment & Plan:   Left BKA wound dehiscence: -Recent BKA, fell multiples times at home. Presented with wound dehsicence.  -Underwent AKA 1/18. -Started on Dilaudid PCA per VVS,  -After lengthy discussion patient is reluctantly agreeable to wean off PCA today and use Dilaudid IV as needed with Percocet in between -PT OT, adamantly declines rehab -TOC consulted for home health services and wheelchair -Discharge planning  ESRD on hemodialysis Hyperkalemia -Noncompliant with diet or fluid restriction -Continue Lokelma, HD per renal  Acute blood loss anemia -Hemoglobin dropped to 6.6 secondary to Intra-Op blood loss and anemia of chronic disease, transfused 1 unit of PRBC -Recent anemia panel with iron deficiency and signs of chronic disease -Continue iron and EPO with HD -CBC pending  Insulin-dependent type 2 diabetes mellitus -CBGs poorly controlled, add 10 units of Levemir,  -Does not follow diabetic diet, increase NovoLog with meals  Narcotic Dependence:  -On Percocet at baseline,  -See med changes as above  Hyponatremia; -Resolved  Pressure Injury 10/14/20 Wrist Left;Anterior Stage 2 -  Partial thickness loss of dermis presenting as a shallow open injury with a red, pink wound bed without slough. (Active)   10/14/20 2100  Location: Wrist  Location Orientation: Left;Anterior  Staging: Stage 2 -  Partial thickness loss of dermis presenting as a shallow open injury with a red, pink wound bed without slough.  Wound Description (Comments):   Present on Admission: Yes   DVT prophylaxis: Heparin subcutaneous Code Status: Full code Family Communication: mother on speaker phone while I was in the room Disposition Plan:  Status is: Inpatient  Remains inpatient appropriate because:Hemodynamically unstable   Dispo: The patient is from: Home              Anticipated d/c is to: Home with home health services, declines rehab              Anticipated d/c date is: Hopefully tomorrow              Patient currently is not medically stable to d/c.  Consultants:   Vascular.,  Nephrology   Procedures: Left above-knee amputation 1/18  Antimicrobials:    Subjective: -Feels okay, no events overnight, underwent dialysis yesterday  Objective: Vitals:   10/18/20 2336 10/19/20 0000 10/19/20 0008 10/19/20 1043  BP: 103/61  (!) 118/51 124/79  Pulse: (!) 109  (!) 108 97  Resp: 20 20 18 20   Temp: 98.8 F (37.1 C)  100.2 F (37.9 C) 98.4 F (36.9 C)  TempSrc: Oral  Oral Oral  SpO2: 94% 100% 92% 97%  Weight:      Height:        Intake/Output Summary (Last 24 hours) at 10/19/2020 1203 Last data filed at 10/19/2020 0200 Gross per 24 hour  Intake 400 ml  Output 6037 ml  Net -5637 ml   [employer]  10/16/20 1014 10/16/20 1250 10/17/20 2032  Weight: 76.1 kg 72.1 kg 72.1 kg    Examination:  General exam: Chronically ill young male sitting up in bed, AAOx3, no distress HEENT: No JVD CVS: S1-S2, regular rate rhythm Lungs: Clear bilaterally, gynecomastia noted Abdomen: Soft, nontender, bowel sounds present Skin with multiple tattoos  Extremities: Left AKA with dressing   Data Reviewed: I have personally reviewed following labs and imaging studies  CBC: Recent Labs  Lab 10/14/20 1020  10/14/20 1153 10/15/20 0327 10/16/20 0256 10/16/20 2323 10/17/20 0647 10/18/20 1305  WBC 9.6  --  9.6 10.2  --  10.2 9.0  NEUTROABS 6.9  --   --   --   --   --   --   HGB 7.6*   < > 7.1* 6.6* 7.4* 7.2* 7.2*  HCT 24.3*   < > 24.2* 22.1* 23.6* 24.3* 23.6*  MCV 96.0  --  97.6 97.4  --  96.0 94.8  PLT 252  --  238 196  --  208 261   < > = values in this interval not displayed.   Basic Metabolic Panel: Recent Labs  Lab 10/15/20 0327 10/15/20 0842 10/16/20 0256 10/17/20 0647 10/18/20 1305  NA 130* 132* 134* 133* 133*  K 6.8* 5.0 6.0* 5.4* 5.6*  CL 91* 95* 96* 97* 95*  CO2 25 23 23 24 25   GLUCOSE 245* 371* 125* 231* 189*  BUN 77* 35* 44* 35* 53*  CREATININE 9.61* 5.99* 7.26* 6.56* 8.40*  CALCIUM 7.7* 7.8* 7.4* 7.4* 7.5*   GFR: Estimated Creatinine Clearance: 10.8 mL/min (A) (by C-G formula based on SCr of 8.4 mg/dL (H)). Liver Function Tests: No results for input(s): AST, ALT, ALKPHOS, BILITOT, PROT, ALBUMIN in the last 168 hours. No results for input(s): LIPASE, AMYLASE in the last 168 hours. No results for input(s): AMMONIA in the last 168 hours. Coagulation Profile: No results for input(s): INR, PROTIME in the last 168 hours. Cardiac Enzymes: No results for input(s): CKTOTAL, CKMB, CKMBINDEX, TROPONINI in the last 168 hours. BNP (last 3 results) No results for input(s): PROBNP in the last 8760 hours. HbA1C: No results for input(s): HGBA1C in the last 72 hours. CBG: Recent Labs  Lab 10/18/20 0700 10/18/20 1448 10/18/20 1813 10/19/20 0008 10/19/20 0639  GLUCAP 370* 127* 148* 148* 485*   Lipid Profile: No results for input(s): CHOL, HDL, LDLCALC, TRIG, CHOLHDL, LDLDIRECT in the last 72 hours. Thyroid Function Tests: No results for input(s): TSH, T4TOTAL, FREET4, T3FREE, THYROIDAB in the last 72 hours. Anemia Panel: No results for input(s): VITAMINB12, FOLATE, FERRITIN, TIBC, IRON, RETICCTPCT in the last 72 hours. Sepsis Labs: No results for input(s):  PROCALCITON, LATICACIDVEN in the last 168 hours.  Recent Results (from the past 240 hour(s))  Resp Panel by RT-PCR (Flu A&B, Covid) Nasopharyngeal Swab     Status: None   Collection Time: 10/14/20  8:37 AM   Specimen: Nasopharyngeal Swab; Nasopharyngeal(NP) swabs in vial transport medium  Result Value Ref Range Status   SARS Coronavirus 2 by RT PCR NEGATIVE NEGATIVE Final    Comment: (NOTE) SARS-CoV-2 target nucleic acids are NOT DETECTED.  The SARS-CoV-2 RNA is generally detectable in upper respiratory specimens during the acute phase of infection. The lowest concentration of SARS-CoV-2 viral copies this assay can detect is 138 copies/mL. A negative result does not preclude SARS-Cov-2 infection and should not be used as the sole basis for treatment or other patient management decisions. A negative result may occur with  improper  specimen collection/handling, submission of specimen other than nasopharyngeal swab, presence of viral mutation(s) within the areas targeted by this assay, and inadequate number of viral copies(<138 copies/mL). A negative result must be combined with clinical observations, patient history, and epidemiological information. The expected result is Negative.  Fact Sheet for Patients:  EntrepreneurPulse.com.au  Fact Sheet for Healthcare Providers:  IncredibleEmployment.be  This test is no t yet approved or cleared by the [place] FDA and  has been authorized for detection and/or diagnosis of SARS-CoV-2 by FDA under an Emergency Use Authorization (EUA). This EUA will remain  in effect (meaning this test can be used) for the duration of the COVID-19 declaration under Section 564(b)(1) of the Act, 21 U.S.C.section 360bbb-3(b)(1), unless the authorization is terminated  or revoked sooner.       Influenza A by PCR NEGATIVE NEGATIVE Final   Influenza B by PCR NEGATIVE NEGATIVE Final    Comment: (NOTE) The Xpert Xpress  SARS-CoV-2/FLU/RSV plus assay is intended as an aid in the diagnosis of influenza from Nasopharyngeal swab specimens and should not be used as a sole basis for treatment. Nasal washings and aspirates are unacceptable for Xpert Xpress SARS-CoV-2/FLU/RSV testing.  Fact Sheet for Patients: EntrepreneurPulse.com.au  Fact Sheet for Healthcare Providers: IncredibleEmployment.be  This test is not yet approved or cleared by the [place] FDA and has been authorized for detection and/or diagnosis of SARS-CoV-2 by FDA under an Emergency Use Authorization (EUA). This EUA will remain in effect (meaning this test can be used) for the duration of the COVID-19 declaration under Section 564(b)(1) of the Act, 21 U.S.C. section 360bbb-3(b)(1), unless the authorization is terminated or revoked.  Performed at [facility], [place] [address] [place], [place] [address]   Surgical pcr screen     Status: None   Collection Time: 10/15/20  4:36 AM   Specimen: Nasal Mucosa; Nasal Swab  Result Value Ref Range Status   MRSA, PCR NEGATIVE NEGATIVE Final   Staphylococcus aureus NEGATIVE NEGATIVE Final    Comment: (NOTE) The Xpert SA Assay (FDA approved for NASAL specimens in patients 94 years of age and older), is one component of a comprehensive surveillance program. It is not intended to diagnose infection nor to guide or monitor treatment. Performed at [facility], [place] [address], [place], [place] [address]      Radiology Studies: No results found.  Scheduled Meds: . (feeding supplement) PROSource Plus  30 mL Oral TID BM  . sodium chloride   Intravenous Once  . calcitRIOL  2.5 mcg Oral Q M,W,F-HD  . Chlorhexidine Gluconate Cloth  6 each Topical Daily  . Chlorhexidine Gluconate Cloth  6 each Topical Q0600  . cinacalcet  60 mg Oral BID WC  . darbepoetin (ARANESP) injection - DIALYSIS  150 mcg Intravenous Q Fri-HD  . heparin  5,000 Units  Subcutaneous Q12H  . insulin aspart  0-6 Units Subcutaneous TID WC  . insulin aspart  6 Units Subcutaneous TID WC  . insulin detemir  10 Units Subcutaneous Daily  . polyethylene glycol  17 g Oral Daily  . senna-docusate  1 tablet Oral BID  . sevelamer carbonate  4,000 mg Oral TID WC  . sodium zirconium cyclosilicate  10 g Oral Daily   Continuous Infusions: . ferric gluconate (FERRLECIT/NULECIT) IV 125 mg (10/19/20 0012)     LOS: 5 days    Time spent: 25 minutes.   Domenic Polite, MD [facility]  10/19/2020, 12:03 PM

## 2020-10-19 NOTE — Progress Notes (Signed)
PCA pump discontinued. 5mg  of dilaudid was waisted in the stericycle. Witnessed by Illene Silver RN   Arita Severtson, RN

## 2020-10-19 NOTE — Progress Notes (Signed)
   VASCULAR SURGERY ASSESSMENT & PLAN:   POD 4 L AKA: His dressing is dry.  He would not let me change his dressing this morning.  Plans are for him to be discharged tomorrow I believe.  We will arrange follow-up with Dr. Oneida Alar.   SUBJECTIVE:   No specific complaints except he would not let me change his dressing.  PHYSICAL EXAM:   Vitals:   10/18/20 2251 10/18/20 2336 10/19/20 0000 10/19/20 0008  BP: (!) 124/93 103/61  (!) 118/51  Pulse: (!) 105 (!) 109  (!) 108  Resp: 20 20 20 18   Temp:  98.8 F (37.1 C)  100.2 F (37.9 C)  TempSrc:  Oral  Oral  SpO2: 99% 94% 100% 92%  Weight:      Height:       His dressing is dry.  LABS:   Lab Results  Component Value Date   WBC 9.0 10/18/2020   HGB 7.2 (L) 10/18/2020   HCT 23.6 (L) 10/18/2020   MCV 94.8 10/18/2020   PLT 261 10/18/2020   Lab Results  Component Value Date   CREATININE 8.40 (H) 10/18/2020   Lab Results  Component Value Date   INR 1.2 07/22/2020   CBG (last 3)  Recent Labs    10/18/20 1813 10/19/20 0008 10/19/20 0639  GLUCAP 148* 148* 485*    PROBLEM LIST:    Active Problems:   Diabetic foot infection ([place])   Wound infection   Wound dehiscence   Pressure injury of skin   CURRENT MEDS:   . (feeding supplement) PROSource Plus  30 mL Oral TID BM  . sodium chloride   Intravenous Once  . calcitRIOL  2.5 mcg Oral Q M,W,F-HD  . Chlorhexidine Gluconate Cloth  6 each Topical Daily  . Chlorhexidine Gluconate Cloth  6 each Topical Q0600  . cinacalcet  60 mg Oral BID WC  . darbepoetin (ARANESP) injection - DIALYSIS  150 mcg Intravenous Q Fri-HD  . heparin  5,000 Units Subcutaneous Q12H  . HYDROmorphone   Intravenous Q4H  . insulin aspart  0-6 Units Subcutaneous TID WC  . insulin aspart  5 Units Subcutaneous TID WC  . insulin detemir  10 Units Subcutaneous Daily  . polyethylene glycol  17 g Oral Daily  . senna-docusate  1 tablet Oral BID  . sevelamer carbonate  4,000 mg Oral TID WC  . sodium  zirconium cyclosilicate  10 g Oral Daily    Deitra Mayo Office: [phone] 10/19/2020

## 2020-10-20 LAB — GLUCOSE, CAPILLARY
Glucose-Capillary: 332 mg/dL — ABNORMAL HIGH (ref 70–99)
Glucose-Capillary: 291 mg/dL — ABNORMAL HIGH (ref 70–99)
Glucose-Capillary: 131 mg/dL — ABNORMAL HIGH (ref 70–99)
Glucose-Capillary: 156 mg/dL — ABNORMAL HIGH (ref 70–99)

## 2020-10-20 MED ORDER — SODIUM ZIRCONIUM CYCLOSILICATE 10 G PO PACK: 10.0000 g | PACK | Freq: Every day | ORAL | 0 refills | Status: AC

## 2020-10-20 MED ORDER — OXYCODONE-ACETAMINOPHEN 5-325 MG PO TABS: 2.0000 | ORAL_TABLET | Freq: Four times a day (QID) | ORAL | 0 refills | Status: DC | PRN

## 2020-10-20 MED ORDER — NOVOLOG 100 UNIT/ML ~~LOC~~ SOLN: 6.0000 [IU] | Freq: Three times a day (TID) | SUBCUTANEOUS | 3 refills | Status: DC

## 2020-10-20 MED ORDER — INSULIN DETEMIR 100 UNIT/ML ~~LOC~~ SOLN
10.0000 [IU] | Freq: Every day | SUBCUTANEOUS | Status: DC
  Filled 2020-10-20 (×3): qty 0.1

## 2020-10-20 NOTE — Progress Notes (Signed)
Pt is still trying to get in touch with his mom, who is his ride home.

## 2020-10-20 NOTE — Progress Notes (Signed)
[facility] Progress Note   Subjective:  Seen in room. No overt dyspnea although he acknowledges that has continued to drink more than should and is up on weights. He reports that he will be discharged today and will go to his usual dialysis tomorrow.  Objective Vitals:   10/19/20 0008 10/19/20 1043 10/19/20 1836 10/19/20 2254  BP: (!) 118/51 124/79 (!) 123/54 (!) 119/48  Pulse: (!) 108 97 (!) 105 (!) 101  Resp: 18 20 20 18   Temp: 100.2 F (37.9 C) 98.4 F (36.9 C) 98.7 F (37.1 C) 98.2 F (36.8 C)  TempSrc: Oral Oral Oral   SpO2: 92% 97% 99% 100%  Weight:      Height:       Physical Exam General: Well appearing man, NAD. Room air. Heart: RRR; no murmur Lungs:  CTAB; no rales Abdomen: soft, non-tender Extremities: L AKA bandaged, no RLE edema Dialysis Access: [facility]  Additional Objective Labs: Basic Metabolic Panel: Recent Labs  Lab 10/17/20 0647 10/18/20 1305 10/19/20 1543  NA 133* 133* 131*  K 5.4* 5.6* 5.4*  CL 97* 95* 95*  CO2 24 25 23   GLUCOSE 231* 189* 323*  BUN 35* 53* 42*  CREATININE 6.56* 8.40* 6.78*  CALCIUM 7.4* 7.5* 7.5*   CBC: Recent Labs  Lab 10/14/20 1020 10/14/20 1153 10/15/20 0327 10/16/20 0256 10/16/20 2323 10/17/20 0647 10/18/20 1305 10/19/20 1543  WBC 9.6  --  9.6 10.2  --  10.2 9.0 8.2  NEUTROABS 6.9  --   --   --   --   --   --   --   HGB 7.6*   < > 7.1* 6.6*   < > 7.2* 7.2* 7.6*  HCT 24.3*   < > 24.2* 22.1*   < > 24.3* 23.6* 25.3*  MCV 96.0  --  97.6 97.4  --  96.0 94.8 94.8  PLT 252  --  238 196  --  208 261 231   < > = values in this interval not displayed.   Medications: . ferric gluconate (FERRLECIT/NULECIT) IV 125 mg (10/19/20 0012)   . (feeding supplement) PROSource Plus  30 mL Oral TID BM  . sodium chloride   Intravenous Once  . calcitRIOL  2.5 mcg Oral Q M,W,F-HD  . Chlorhexidine Gluconate Cloth  6 each Topical Daily  . Chlorhexidine Gluconate Cloth  6 each Topical Q0600  . cinacalcet  60 mg Oral BID  WC  . darbepoetin (ARANESP) injection - DIALYSIS  150 mcg Intravenous Q Fri-HD  . heparin  5,000 Units Subcutaneous Q12H  . insulin aspart  0-6 Units Subcutaneous TID WC  . insulin aspart  6 Units Subcutaneous TID WC  . insulin detemir  10 Units Subcutaneous Daily  . polyethylene glycol  17 g Oral Daily  . senna-docusate  1 tablet Oral BID  . sevelamer carbonate  4,000 mg Oral TID WC  . sodium zirconium cyclosilicate  10 g Oral Daily    Dialysis Orders: MWF @ [facility] 4h 77min 400/800 67kg TDC Hep 5000 + 5000 midrun - Calcitriol 99mcg PO q HD - No ESA Noted scheduled for R BC AVF on 09/24/20, canceled/delayed per patient request.  Problem/Plan: 1. Hyperkalemia-ongoing issue with diet noncompliance,lastK5.6, using low K bath. Lokelma has been ordered daily, reminded on diet. 2. Left BKA wound dehiscence: S/p AKA 1/18by Dr. Oneida Alar. Pain control ongoing issue. 3. ESRD: Usual MWF schedule - last dialyzed here 1/21. He had requested extra HD but unable to accommodate d/t  high census/critical staffing shortage - he remains on room air and without critical need, fluid restrictions reviewed again. Next HD will be tomorrow (1/24), either here or as outpatient if he is discharged. 4. Anemia: Hgb 7.6, continue Aranesp 150 q Fridays.1 unit pRBCgiven 1/20, tsat 7%,IV iron ordered. 5. Secondary hyperparathyroidism: CorrCa ok. Continue binders, VDRA and sensipar 6. HTN/volume -BPat goal.Ongoing large gains, up about 7kg. UF as tolerated.  7. Nutrition- lastalbumin 2.5. Patient refuses renal carb diet always asked for regular. Provided education about following renal diet.  8. Bipolar disorder underlying personality disorder-Meds Per admit 9. Type II DM - chronically uncontrolled plan Per admit   Veneta Penton, Hershal Coria 10/20/2020, 9:31 AM  [facility]

## 2020-10-21 DIAGNOSIS — R69 Illness, unspecified: Secondary | ICD-10-CM

## 2020-10-21 LAB — RENAL FUNCTION PANEL
Albumin: 2.2 g/dL — ABNORMAL LOW (ref 3.5–5.0)
Anion gap: 13 (ref 5–15)
BUN: 62 mg/dL — ABNORMAL HIGH (ref 6–20)
CO2: 24 mmol/L (ref 22–32)
Calcium: 7.2 mg/dL — ABNORMAL LOW (ref 8.9–10.3)
Chloride: 94 mmol/L — ABNORMAL LOW (ref 98–111)
Creatinine, Ser: 8.41 mg/dL — ABNORMAL HIGH (ref 0.61–1.24)
GFR, Estimated: 8 mL/min — ABNORMAL LOW (ref >60)
Glucose, Bld: 158 mg/dL — ABNORMAL HIGH (ref 70–99)
Phosphorus: 6.9 mg/dL — ABNORMAL HIGH (ref 2.5–4.6)
Potassium: 6 mmol/L — ABNORMAL HIGH (ref 3.5–5.1)
Sodium: 131 mmol/L — ABNORMAL LOW (ref 135–145)

## 2020-10-21 LAB — CBC
HCT: 23.6 % — ABNORMAL LOW (ref 39.0–52.0)
Hemoglobin: 7.6 g/dL — ABNORMAL LOW (ref 13.0–17.0)
MCH: 30.2 pg (ref 26.0–34.0)
MCHC: 32.2 g/dL (ref 30.0–36.0)
MCV: 93.7 fL (ref 80.0–100.0)
Platelets: 260 10*3/uL (ref 150–400)
RBC: 2.52 MIL/uL — ABNORMAL LOW (ref 4.22–5.81)
RDW: 15.2 % (ref 11.5–15.5)
WBC: 7.8 10*3/uL (ref 4.0–10.5)
nRBC: 0 % (ref 0.0–0.2)

## 2020-10-21 LAB — GLUCOSE, CAPILLARY
Glucose-Capillary: 157 mg/dL — ABNORMAL HIGH (ref 70–99)
Glucose-Capillary: 305 mg/dL — ABNORMAL HIGH (ref 70–99)

## 2020-10-21 MED ORDER — OXYCODONE-ACETAMINOPHEN 5-325 MG PO TABS
ORAL_TABLET | ORAL | Status: AC
  Filled 2020-10-21: qty 2

## 2020-10-21 MED ORDER — HEPARIN SODIUM (PORCINE) 1000 UNIT/ML IJ SOLN
INTRAMUSCULAR | Status: AC
  Filled 2020-10-21: qty 2

## 2020-10-21 MED ORDER — HEPARIN SODIUM (PORCINE) 1000 UNIT/ML IJ SOLN
INTRAMUSCULAR | Status: AC
  Filled 2020-10-21: qty 4

## 2020-10-21 MED ORDER — INSULIN ASPART 100 UNIT/ML ~~LOC~~ SOLN
5.0000 [IU] | Freq: Once | SUBCUTANEOUS | Status: AC
  Administered 2020-10-21: 5 [IU] via SUBCUTANEOUS

## 2020-10-21 MED ORDER — HEPARIN SODIUM (PORCINE) 1000 UNIT/ML DIALYSIS: 20.0000 [IU]/kg | INTRAMUSCULAR | Status: DC | PRN

## 2020-10-21 NOTE — Progress Notes (Addendum)
PT Cancellation Note  Patient Details Name: [name] MRN: [id number] DOB: [birth year]   Cancelled Treatment:    Reason Eval/Treat Not Completed: Other (comment) patient actively walking around in his room and currently sitting on/actively using BSC. Will attempt to return later if time/schedule allow.    Windell Norfolk, DPT, PN1   Supplemental Physical Therapist [facility]    Pager [phone] Acute Rehab Office [phone]

## 2020-10-21 NOTE — Discharge Summary (Addendum)
Physician Discharge Summary  Shane BOISCLAIR BJY:[id number] DOB: 1981-03-06 DOA: 10/13/2020  PCP: Kerin Perna, NP  Admit date: 10/13/2020 Discharge date: 10/20/2020  Time spent: 35 minutes  Recommendations for Outpatient Follow-up:  Vascular Dr.Fields in 1 week   Discharge Diagnoses:  Active Problems:   Diabetic foot infection ([place])   Wound infection   Wound dehiscence   ESRD on hemodialysis   Hyperkalemia   NON COMPLIANCE   DM 2 with hyperglycemia  Discharge Condition: stable  Diet recommendation:renal carb modified  Filed Weights   10/16/20 1014 10/16/20 1250 10/17/20 2032  Weight: 76.1 kg 72.1 kg 72.1 kg    History of present illness:  40 year old with past medical history significant for 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 -Recently hospitalized and underwent left BKA on 08/2020 for left foot and ankle osteomyelitis.  Had a long hospital stay, when he decided to leave AMA  on 1/15.  -Back to the ED 1/16 with dehiscence of his left BKA after a fall, missed hemodialysis and hyperkalemia -Long history of noncompliance  Hospital Course:   Left BKA wound dehiscence: -Recent BKA, fell multiples times at home. Presented with wound dehsicence.  -Underwent AKA 1/18. -after much discussion, eventually transitioned off Dilaudid PCA -PT OT, adamantly declines rehab -TOC consulted for home health services and wheelchair -discharge home with [facility] services, FU with Vascular in 1 week  ESRD on hemodialysis Hyperkalemia -Noncompliant with diet or fluid restriction -Continue Lokelma, HD tomorrow  Acute blood loss anemia -Hemoglobin dropped to 6.6 secondary to Intra-Op blood loss and anemia of chronic disease, transfused 1 unit of PRBC -Recent anemia panel with iron deficiency and signs of chronic disease -Continue  iron and EPO with HD  Insulin-dependent type 2 diabetes mellitus -CBGs poorly controlled, added Levemir inpatient which he declines -Does not follow diabetic diet, increased NovoLog with meals  Narcotic Dependence:  -On Percocet at baseline,  -See med changes as above  Hyponatremia; -Resolved  Pressure Injury 10/14/20 Wrist Left;Anterior Stage 2 -  Partial thickness loss of dermis presenting as a shallow open injury with a red, pink wound bed without slough. (Active)  10/14/20 2100  Location: Wrist  Location Orientation: Left;Anterior  Staging: Stage 2 -  Partial thickness loss of dermis presenting as a shallow open injury with a red, pink wound bed without slough.  Wound Description (Comments):   Present on Admission: Yes    Consultants:   Vascular.,  Nephrology   Procedures: Left above-knee amputation 1/18  Discharge Exam: Vitals:   10/20/20 0900 10/20/20 2057  BP: (!) 109/44 113/63  Pulse: 96 99  Resp: 20 18  Temp: 98.1 F (36.7 C) 99.1 F (37.3 C)  SpO2: 94% 100%    General: AAOx3 Cardiovascular: S1S2/RRR Respiratory: CTAB  Discharge Instructions   Discharge Instructions    Discharge instructions   Complete by: As directed    Renal Carb modified diet   Increase activity slowly   Complete by: As directed    No wound care   Complete by: As directed      Allergies as of 10/21/2020   No Known Allergies     Medication List    STOP taking these medications   Regranex 0.01 % gel Generic drug: becaplermin     TAKE these medications   (feeding supplement) PROSource Plus liquid Take 30 mLs by mouth 3 (three) times  daily between meals for 30 doses.   albuterol 108 (90 Base) MCG/ACT inhaler Commonly known as: VENTOLIN HFA Inhale 2 puffs into the lungs every 6 (six) hours as needed for wheezing or shortness of breath.   calcitRIOL 0.5 MCG capsule Commonly known as: ROCALTROL Take 5 capsules (2.5 mcg total) by mouth every Monday, Wednesday,  and Friday with hemodialysis.   cinacalcet 60 MG tablet Commonly known as: SENSIPAR Take 120 mg by mouth daily after supper.   ibuprofen 600 MG tablet Commonly known as: ADVIL Take 1 tablet (600 mg total) by mouth every 6 (six) hours as needed for up to 30 doses for moderate pain.   MIRCERA IJ   NovoLOG 100 UNIT/ML injection Generic drug: insulin aspart Inject 6 Units into the skin 3 (three) times daily with meals. INJECT 6 UNITS SUBCUTANEOUSLY TWICE DAILY WITH A MEAL What changed:   how much to take  how to take this  when to take this   OneTouch Delica Plus [id number] Misc Apply topically.   OneTouch Verio Flex System w/Device Kit 1 Bag by Does not apply route 3 (three) times daily before meals.   OneTouch Verio test strip Generic drug: glucose blood Check blood sugars three times daily   oxyCODONE-acetaminophen 5-325 MG tablet Commonly known as: Percocet Take 2 tablets by mouth every 6 (six) hours as needed for up to 15 doses for severe pain. What changed: how much to take   sevelamer carbonate 800 MG tablet Commonly known as: RENVELA Take 2,400-4,000 mg by mouth See admin instructions. Take 4,000 mg (5 tablets) by mouth with meals and 2,400 mg (3 tablets) with snacks   sodium zirconium cyclosilicate 10 g Pack packet Commonly known as: LOKELMA Take 10 g by mouth daily for 5 days. What changed:   when to take this  additional instructions      No Known Allergies  Follow-up Information    [facility]. Call.   Why: to follow up on wheelchair options Contact information: [phone]       Kerin Perna, NP. Schedule an appointment as soon as possible for a visit in 1 week(s).   Specialty: Internal Medicine Contact information: [address] [place] [address] [phone]        Elam Dutch, MD. Go in 1 week(s).   Specialties: Vascular Surgery, Cardiology Contact information: [address] [place]  [address] [phone]                The results of significant diagnostics from this hospitalization (including imaging, microbiology, ancillary and laboratory) are listed below for reference.    Significant Diagnostic Studies: No results found.  Microbiology: Recent Results (from the past 240 hour(s))  Resp Panel by RT-PCR (Flu A&B, Covid) Nasopharyngeal Swab     Status: None   Collection Time: 10/14/20  8:37 AM   Specimen: Nasopharyngeal Swab; Nasopharyngeal(NP) swabs in vial transport medium  Result Value Ref Range Status   SARS Coronavirus 2 by RT PCR NEGATIVE NEGATIVE Final    Comment: (NOTE) SARS-CoV-2 target nucleic acids are NOT DETECTED.  The SARS-CoV-2 RNA is generally detectable in upper respiratory specimens during the acute phase of infection. The lowest concentration of SARS-CoV-2 viral copies this assay can detect is 138 copies/mL. A negative result does not preclude SARS-Cov-2 infection and should not be used as the sole basis for treatment or other patient management decisions. A negative result may occur with  improper specimen collection/handling, submission of specimen other than nasopharyngeal swab, presence  of viral mutation(s) within the areas targeted by this assay, and inadequate number of viral copies(<138 copies/mL). A negative result must be combined with clinical observations, patient history, and epidemiological information. The expected result is Negative.  Fact Sheet for Patients:  EntrepreneurPulse.com.au  Fact Sheet for Healthcare Providers:  IncredibleEmployment.be  This test is no t yet approved or cleared by the [place] FDA and  has been authorized for detection and/or diagnosis of SARS-CoV-2 by FDA under an Emergency Use Authorization (EUA). This EUA will remain  in effect (meaning this test can be used) for the duration of the COVID-19 declaration under Section 564(b)(1) of the Act,  21 U.S.C.section 360bbb-3(b)(1), unless the authorization is terminated  or revoked sooner.       Influenza A by PCR NEGATIVE NEGATIVE Final   Influenza B by PCR NEGATIVE NEGATIVE Final    Comment: (NOTE) The Xpert Xpress SARS-CoV-2/FLU/RSV plus assay is intended as an aid in the diagnosis of influenza from Nasopharyngeal swab specimens and should not be used as a sole basis for treatment. Nasal washings and aspirates are unacceptable for Xpert Xpress SARS-CoV-2/FLU/RSV testing.  Fact Sheet for Patients: EntrepreneurPulse.com.au  Fact Sheet for Healthcare Providers: IncredibleEmployment.be  This test is not yet approved or cleared by the [place] FDA and has been authorized for detection and/or diagnosis of SARS-CoV-2 by FDA under an Emergency Use Authorization (EUA). This EUA will remain in effect (meaning this test can be used) for the duration of the COVID-19 declaration under Section 564(b)(1) of the Act, 21 U.S.C. section 360bbb-3(b)(1), unless the authorization is terminated or revoked.  Performed at [facility], [address], [place], [place] [address]   Surgical pcr screen     Status: None   Collection Time: 10/15/20  4:36 AM   Specimen: Nasal Mucosa; Nasal Swab  Result Value Ref Range Status   MRSA, PCR NEGATIVE NEGATIVE Final   Staphylococcus aureus NEGATIVE NEGATIVE Final    Comment: (NOTE) The Xpert SA Assay (FDA approved for NASAL specimens in patients 82 years of age and older), is one component of a comprehensive surveillance program. It is not intended to diagnose infection nor to guide or monitor treatment. Performed at [facility], [place] [address] [place], [place] [address]      Labs: Basic Metabolic Panel: Recent Labs  Lab 10/15/20 [phone] 10/16/20 0256 10/17/20 0647 10/18/20 1305 10/19/20 1543  NA 132* 134* 133* 133* 131*  K 5.0 6.0* 5.4* 5.6* 5.4*  CL 95* 96* 97* 95* 95*  CO2 '23 23 24  25 23  ' GLUCOSE 371* 125* 231* 189* 323*  BUN 35* 44* 35* 53* 42*  CREATININE 5.99* 7.26* 6.56* 8.40* 6.78*  CALCIUM 7.8* 7.4* 7.4* 7.5* 7.5*   Liver Function Tests: No results for input(s): AST, ALT, ALKPHOS, BILITOT, PROT, ALBUMIN in the last 168 hours. No results for input(s): LIPASE, AMYLASE in the last 168 hours. No results for input(s): AMMONIA in the last 168 hours. CBC: Recent Labs  Lab 10/14/20 1020 10/14/20 1153 10/15/20 0327 10/16/20 0256 10/16/20 2323 10/17/20 0647 10/18/20 1305 10/19/20 1543  WBC 9.6  --  9.6 10.2  --  10.2 9.0 8.2  NEUTROABS 6.9  --   --   --   --   --   --   --   HGB 7.6*   < > 7.1* 6.6* 7.4* 7.2* 7.2* 7.6*  HCT 24.3*   < > 24.2* 22.1* 23.6* 24.3* 23.6* 25.3*  MCV 96.0  --  97.6 97.4  --  96.0 94.8 94.8  PLT 252  --  238 196  --  208 261 231   < > = values in this interval not displayed.   Cardiac Enzymes: No results for input(s): CKTOTAL, CKMB, CKMBINDEX, TROPONINI in the last 168 hours. BNP: BNP (last 3 results) No results for input(s): BNP in the last 8760 hours.  ProBNP (last 3 results) No results for input(s): PROBNP in the last 8760 hours.  CBG: Recent Labs  Lab 10/19/20 2210 10/20/20 0740 10/20/20 1215 10/20/20 1635 10/20/20 2059  GLUCAP 88 332* 291* 131* 156*       Signed:  Domenic Polite MD.  [facility] 10/21/2020, 7:45 AM

## 2020-10-21 NOTE — Plan of Care (Signed)
  Problem: Education: Goal: Knowledge of General Education information will improve Description Including pain rating scale, medication(s)/side effects and non-pharmacologic comfort measures Outcome: Progressing   

## 2020-10-21 NOTE — Plan of Care (Signed)
  Problem: Education: Goal: Knowledge of General Education information will improve Description: Including pain rating scale, medication(s)/side effects and non-pharmacologic comfort measures 10/21/2020 1304 by Dolores Hoose, RN Outcome: Completed/Met 10/21/2020 0804 by Dolores Hoose, RN Outcome: Progressing   Problem: Health Behavior/Discharge Planning: Goal: Ability to manage health-related needs will improve Outcome: Completed/Met   Problem: Clinical Measurements: Goal: Ability to maintain clinical measurements within normal limits will improve Outcome: Completed/Met Goal: Will remain free from infection Outcome: Completed/Met Goal: Diagnostic test results will improve Outcome: Completed/Met Goal: Respiratory complications will improve Outcome: Completed/Met Goal: Cardiovascular complication will be avoided Outcome: Completed/Met   Problem: Activity: Goal: Risk for activity intolerance will decrease Outcome: Completed/Met   Problem: Nutrition: Goal: Adequate nutrition will be maintained Outcome: Completed/Met   Problem: Coping: Goal: Level of anxiety will decrease Outcome: Completed/Met   Problem: Elimination: Goal: Will not experience complications related to bowel motility Outcome: Completed/Met Goal: Will not experience complications related to urinary retention Outcome: Completed/Met   Problem: Pain Managment: Goal: General experience of comfort will improve Outcome: Completed/Met   Problem: Safety: Goal: Ability to remain free from injury will improve Outcome: Completed/Met   Problem: Skin Integrity: Goal: Risk for impaired skin integrity will decrease Outcome: Completed/Met   Problem: Education: Goal: Knowledge of the prescribed therapeutic regimen will improve Outcome: Completed/Met Goal: Ability to verbalize activity precautions or restrictions will improve Outcome: Completed/Met Goal: Understanding of discharge needs will improve Outcome:  Completed/Met   Problem: Activity: Goal: Ability to perform//tolerate increased activity and mobilize with assistive devices will improve Outcome: Completed/Met   Problem: Clinical Measurements: Goal: Postoperative complications will be avoided or minimized Outcome: Completed/Met   Problem: Self-Care: Goal: Ability to meet self-care needs will improve Outcome: Completed/Met   Problem: Self-Concept: Goal: Ability to maintain and perform role responsibilities to the fullest extent possible will improve Outcome: Completed/Met   Problem: Pain Management: Goal: Pain level will decrease with appropriate interventions Outcome: Completed/Met   Problem: Skin Integrity: Goal: Demonstration of wound healing without infection will improve Outcome: Completed/Met   Problem: Acute Rehab OT Goals (only OT should resolve) Goal: Pt. Will Perform Grooming Outcome: Completed/Met Goal: Pt. Will Perform Lower Body Bathing Outcome: Completed/Met Goal: Pt. Will Perform Lower Body Dressing Outcome: Completed/Met Goal: Pt. Will Transfer To Toilet Outcome: Completed/Met Goal: Pt. Will Perform Toileting-Clothing Manipulation Outcome: Completed/Met Goal: Pt/Caregiver Will Perform Home Exercise Program Outcome: Completed/Met Goal: OT Additional ADL Goal #1 Outcome: Completed/Met   Problem: Acute Rehab PT Goals(only PT should resolve) Goal: Pt Will Go Supine/Side To Sit Outcome: Completed/Met Goal: Pt Will Go Sit To Supine/Side Outcome: Completed/Met Goal: Patient Will Transfer Sit To/From Stand Outcome: Completed/Met Goal: Pt Will Transfer Bed To Chair/Chair To Bed Outcome: Completed/Met Goal: PT Additional Goal #1 Outcome: Completed/Met

## 2020-10-21 NOTE — Progress Notes (Addendum)
Patient was informed in a.m for discharge after dialysis and was aware. Patient stated that he has to wait for his mom to come off work past 5 pm for her to pick him up. Patient was reminded again of his discharge but wants to have his dinner tray first before he goes home. Patient has not ordered his dinner tray up to this point. Contacted the cell number of mother and left a voicemail for her to pick son as he is being discharged. Charge RN was informed and aware of this discharge situation wherein patient is stalling his discharge for today. Nursing Supervisor was notified of situation and with RN in room, informed the patient that he had been discharge since this morning and after dialysis.Patient wanting to order first his dinner tray and eat. Ron Therapist, sports, the nursing supervisor gave him until 8 pm to clear his things and will be discharge by then. Patient is aware of the time he needs to be out of this hospital.

## 2020-10-21 NOTE — Progress Notes (Signed)
Patient said that his mom cannot pick him up and he has no ride. He wants to stay for the night and do his dialysis here  tomorrow before discharge. Explained to him about short staffing in HD and possibility of  being rolled over to next day in Hemodialysis because of this. He started to raise his voice and getting mad at this, he reiterate to take his chances tomorrow so long as he will be dialyze before discharge.. Per patient  there is no sense of sending him home today and go dialysis tomorrow. Going up and down his apartment that would take a toll on him.told his  mom is out of town and nobody will pick him up. I told him I will asked the night provider. Paged MD Hal Hope and made him aware. Okay patient to stay for the night.

## 2020-10-21 NOTE — Progress Notes (Addendum)
   Left AKA healing well without ischemic changes Retention sock explained and placed over stump  F/U with [facility] for staple removal 4 weeks from surgery 10/15/20  Stable disposition from a vascular point of view.  Roxy Horseman PA-C  Agree with above. For d/c today. Wound care and retention sock application discussed.  Ruta Hinds, MD [facility] Office: [phone]

## 2020-10-21 NOTE — Plan of Care (Signed)

## 2020-10-21 NOTE — Progress Notes (Signed)
   10/21/20 2024  TOC ED Mini Assessment  PING Used in TOC Assessment No  Admission or Readmission Diverted  (n/a)  Interventions which prevented an admission or readmission Transportation Screening  ED RN CM called [facility] for transport home, patient placed on list

## 2020-10-21 NOTE — Progress Notes (Signed)
[facility] Progress Note   Subjective:   Seen on HD, decided to stay overnight to get dialysis today. No concerns, denies SOB, CP, dizziness, and nausea.  Objective Vitals:   10/19/20 1836 10/19/20 2254 10/20/20 0900 10/20/20 2057  BP: (!) 123/54 (!) 119/48 (!) 109/44 113/63  Pulse: (!) 105 (!) 101 96 99  Resp: 20 18 20 18   Temp: 98.7 F (37.1 C) 98.2 F (36.8 C) 98.1 F (36.7 C) 99.1 F (37.3 C)  TempSrc: Oral  Oral Oral  SpO2: 99% 100% 94% 100%  Weight:      Height:       Physical Exam General: Well developed male, alert and in NAD Heart: RRR, no murmurs, rubs or gallops Lungs: CTA bilaterally without wheezing, rhonchi or rales Abdomen: Soft, non-tender, non-distended, +BS Extremities: No edema RLE. L AKA Dialysis Access: [facility] accessed  Additional Objective Labs: Basic Metabolic Panel: Recent Labs  Lab 10/17/20 0647 10/18/20 1305 10/19/20 1543  NA 133* 133* 131*  K 5.4* 5.6* 5.4*  CL 97* 95* 95*  CO2 24 25 23   GLUCOSE 231* 189* 323*  BUN 35* 53* 42*  CREATININE 6.56* 8.40* 6.78*  CALCIUM 7.4* 7.5* 7.5*   CBC: Recent Labs  Lab 10/14/20 1020 10/14/20 1153 10/15/20 0327 10/16/20 0256 10/16/20 2323 10/17/20 0647 10/18/20 1305 10/19/20 1543  WBC 9.6  --  9.6 10.2  --  10.2 9.0 8.2  NEUTROABS 6.9  --   --   --   --   --   --   --   HGB 7.6*   < > 7.1* 6.6*   < > 7.2* 7.2* 7.6*  HCT 24.3*   < > 24.2* 22.1*   < > 24.3* 23.6* 25.3*  MCV 96.0  --  97.6 97.4  --  96.0 94.8 94.8  PLT 252  --  238 196  --  208 261 231   < > = values in this interval not displayed.   Blood Culture    Component Value Date/Time   SDES BLOOD RIGHT ANTECUBITAL 09/21/2020 0851   SDES BLOOD RIGHT ANTECUBITAL 09/21/2020 0851   SPECREQUEST  09/21/2020 0851    BOTTLES DRAWN AEROBIC AND ANAEROBIC Blood Culture adequate volume   SPECREQUEST  09/21/2020 0851    BOTTLES DRAWN AEROBIC AND ANAEROBIC Blood Culture results may not be optimal due to an inadequate volume of  blood received in culture bottles   CULT  09/21/2020 0851    NO GROWTH 5 DAYS Performed at [facility], [place] [address], [place], [place] [address]    CULT  09/21/2020 [phone]    NO GROWTH 5 DAYS Performed at [facility] [address] [place], [place] [address]    REPTSTATUS 09/26/2020 FINAL 09/21/2020 0851   REPTSTATUS 09/26/2020 FINAL 09/21/2020 0851    CBG: Recent Labs  Lab 10/19/20 2210 10/20/20 0740 10/20/20 1215 10/20/20 1635 10/20/20 2059  GLUCAP 88 332* 291* 131* 156*   Medications: . ferric gluconate (FERRLECIT/NULECIT) IV 125 mg (10/19/20 0012)   . (feeding supplement) PROSource Plus  30 mL Oral TID BM  . sodium chloride   Intravenous Once  . calcitRIOL  2.5 mcg Oral Q M,W,F-HD  . Chlorhexidine Gluconate Cloth  6 each Topical Daily  . Chlorhexidine Gluconate Cloth  6 each Topical Q0600  . cinacalcet  60 mg Oral BID WC  . darbepoetin (ARANESP) injection - DIALYSIS  150 mcg Intravenous Q Fri-HD  . heparin  5,000 Units Subcutaneous Q12H  .  heparin sodium (porcine)      . insulin aspart  0-6 Units Subcutaneous TID WC  . insulin aspart  6 Units Subcutaneous TID WC  . insulin detemir  10 Units Subcutaneous Daily  . polyethylene glycol  17 g Oral Daily  . senna-docusate  1 tablet Oral BID  . sevelamer carbonate  4,000 mg Oral TID WC  . sodium zirconium cyclosilicate  10 g Oral Daily    Dialysis Orders: MWF @ [facility] 4h 63min 400/800 67kg TDC Hep 5000 + 5000 midrun - Calcitriol 42mcg PO q HD - No ESA Noted scheduled for R BC AVF on 09/24/20, canceled/delayed per patient request.  Assessment/Plan: 1. Hyperkalemia-ongoing issue with diet noncompliance, Lokelmahas been ordered daily, reminded to follow low potassium diet. 2. Left BKA wound dehiscence: S/pAKA 1/18by Dr. Oneida Alar.Per vascular 3. ESRD: Usual MWF schedule - last dialyzed here 1/21. He had requested extra HD but unable to accommodate d/t high census/critical staffing shortage - he  remains on room air and without critical need, fluid restrictions reviewed again. Getting HD today prior to discharge.  4. Anemia: Hgb 7.6, continueAranesp 150 q Fridays.1 unit pRBCgiven1/20,tsat 7%,IV iron ordered. 5. Secondary hyperparathyroidism: CorrCa ok. Continue binders, VDRA and sensipar 6. HTN/volume -BPat goal.Ongoing large gains, up about 7kg. UF as tolerated.  7. Nutrition- lastalbumin 2.5. Patient refuses renal carb diet always asked for regular. Provided education about following renal diet.  8. Bipolar disorder underlying personality disorder-Meds Per admit 9. Type II DM - chronically uncontrolled plan Per admit    Anice Paganini, PA-C 10/21/2020, 8:52 AM  [facility] Pager: [phone]

## 2020-10-22 ENCOUNTER — Telehealth: Payer: Self-pay | Admitting: Nephrology

## 2020-10-22 ENCOUNTER — Telehealth: Payer: Self-pay

## 2020-10-22 DIAGNOSIS — I10 Essential (primary) hypertension: Secondary | ICD-10-CM | POA: Diagnosis not present

## 2020-10-22 DIAGNOSIS — Z7401 Bed confinement status: Secondary | ICD-10-CM | POA: Diagnosis not present

## 2020-10-22 DIAGNOSIS — I959 Hypotension, unspecified: Secondary | ICD-10-CM | POA: Diagnosis not present

## 2020-10-22 DIAGNOSIS — M2550 Pain in unspecified joint: Secondary | ICD-10-CM | POA: Diagnosis not present

## 2020-10-22 MED ORDER — HYDROMORPHONE HCL 1 MG/ML IJ SOLN
1.0000 mg | INTRAMUSCULAR | Status: DC | PRN
  Administered 2020-10-22: 1 mg via INTRAMUSCULAR
  Filled 2020-10-22: qty 1

## 2020-10-22 NOTE — Telephone Encounter (Signed)
Transition of Care   Date of discharge and from where: [facility] on 10/20/2020  How have you been since you were released from the hospital? Stated like coming out from the hospital , pt stated this is not the right time and to speak with his mother Rodena Piety Any questions or concerns? Pt stated his mother Know all the details. Mother stated she already had been contacted by multiple people today and her only concern is transportation to the dialysis anything else can wait. Pt and mother did not want to continue the conversation at this time and said to another time.   Discussed with mother and pt to call the clinic if any questions or concerns and to call office or go to the ED if needs of immediate medical attention. Phone nr and contact provided. Verbalized understanding

## 2020-10-22 NOTE — Telephone Encounter (Signed)
Transition of Care Contact from [place]   Date of Discharge: 10/21/20 Date of Contact: 10/22/20 Method of contact: phone Talked to patient and mother   Patient contacted to discuss transition of care form recent hospitaliztion. Patient was admitted to [facility] from 10/13/20 to 10/20/20 with the discharge diagnosis of AKA due to L BKA wound dehiscence.     Medication changes were reviewed - advised to stop calcitriol.  Patient will follow up with is outpatient dialysis center 10/23/20. Other follow up needs include patient mother reports she requires a stretcher to get down the stairs of their apartment for dialysis.  She did not want to elaborate further reported she is working it out with the SW at his center.    Jen Mow, PA-C [name] Pager: [phone]

## 2020-10-22 NOTE — Progress Notes (Addendum)
DISCHARGE NOTE [name] to be discharged Home per MD order. Patient verbalized understanding.  Skin clean, dry and intact without evidence of skin break down, no evidence of skin tears noted. IV catheter discontinued intact. Site without signs and symptoms of complications. Dressing and pressure applied. Pt denies pain at the site currently. No complaints noted. Patient has right  Chest HD catheter in placed.  Discharge packet assembled. An After Visit Summary (AVS) was printed and given to the patient. All questions and concerns addressed.Patient escorted via stretcher and discharged to home via [name].    Babs Sciara, RN

## 2020-10-23 DIAGNOSIS — E875 Hyperkalemia: Secondary | ICD-10-CM | POA: Diagnosis not present

## 2020-10-23 DIAGNOSIS — D509 Iron deficiency anemia, unspecified: Secondary | ICD-10-CM | POA: Diagnosis not present

## 2020-10-23 DIAGNOSIS — N186 End stage renal disease: Secondary | ICD-10-CM | POA: Diagnosis not present

## 2020-10-23 DIAGNOSIS — T8781 Dehiscence of amputation stump: Secondary | ICD-10-CM | POA: Diagnosis not present

## 2020-10-23 DIAGNOSIS — D631 Anemia in chronic kidney disease: Secondary | ICD-10-CM | POA: Diagnosis not present

## 2020-10-23 DIAGNOSIS — E1129 Type 2 diabetes mellitus with other diabetic kidney complication: Secondary | ICD-10-CM | POA: Diagnosis not present

## 2020-10-23 DIAGNOSIS — D688 Other specified coagulation defects: Secondary | ICD-10-CM | POA: Diagnosis not present

## 2020-10-23 DIAGNOSIS — N2581 Secondary hyperparathyroidism of renal origin: Secondary | ICD-10-CM | POA: Diagnosis not present

## 2020-10-23 DIAGNOSIS — R279 Unspecified lack of coordination: Secondary | ICD-10-CM | POA: Diagnosis not present

## 2020-10-23 DIAGNOSIS — T8249XA Other complication of vascular dialysis catheter, initial encounter: Secondary | ICD-10-CM | POA: Diagnosis not present

## 2020-10-23 DIAGNOSIS — Z992 Dependence on renal dialysis: Secondary | ICD-10-CM | POA: Diagnosis not present

## 2020-10-23 DIAGNOSIS — E1022 Type 1 diabetes mellitus with diabetic chronic kidney disease: Secondary | ICD-10-CM | POA: Diagnosis not present

## 2020-10-23 DIAGNOSIS — Z743 Need for continuous supervision: Secondary | ICD-10-CM | POA: Diagnosis not present

## 2020-10-23 DIAGNOSIS — I10 Essential (primary) hypertension: Secondary | ICD-10-CM | POA: Diagnosis not present

## 2020-10-23 DIAGNOSIS — R5381 Other malaise: Secondary | ICD-10-CM | POA: Diagnosis not present

## 2020-10-23 LAB — FUNGAL ORGANISM REFLEX

## 2020-10-23 LAB — GLUCOSE, CAPILLARY
Glucose-Capillary: 137 mg/dL — ABNORMAL HIGH (ref 70–99)
Glucose-Capillary: 165 mg/dL — ABNORMAL HIGH (ref 70–99)

## 2020-10-23 LAB — FUNGUS CULTURE WITH STAIN

## 2020-10-23 LAB — FUNGUS CULTURE RESULT

## 2020-10-24 ENCOUNTER — Encounter (HOSPITAL_COMMUNITY): Payer: Medicare HMO

## 2020-10-24 ENCOUNTER — Ambulatory Visit: Payer: Medicare HMO

## 2020-10-24 DIAGNOSIS — I10 Essential (primary) hypertension: Secondary | ICD-10-CM | POA: Diagnosis not present

## 2020-10-25 DIAGNOSIS — I10 Essential (primary) hypertension: Secondary | ICD-10-CM | POA: Diagnosis not present

## 2020-10-25 DIAGNOSIS — D509 Iron deficiency anemia, unspecified: Secondary | ICD-10-CM | POA: Diagnosis not present

## 2020-10-25 DIAGNOSIS — E1022 Type 1 diabetes mellitus with diabetic chronic kidney disease: Secondary | ICD-10-CM | POA: Diagnosis not present

## 2020-10-25 DIAGNOSIS — R5381 Other malaise: Secondary | ICD-10-CM | POA: Diagnosis not present

## 2020-10-25 DIAGNOSIS — T8249XA Other complication of vascular dialysis catheter, initial encounter: Secondary | ICD-10-CM | POA: Diagnosis not present

## 2020-10-25 DIAGNOSIS — N186 End stage renal disease: Secondary | ICD-10-CM | POA: Diagnosis not present

## 2020-10-25 DIAGNOSIS — M2550 Pain in unspecified joint: Secondary | ICD-10-CM | POA: Diagnosis not present

## 2020-10-25 DIAGNOSIS — D688 Other specified coagulation defects: Secondary | ICD-10-CM | POA: Diagnosis not present

## 2020-10-25 DIAGNOSIS — Z7401 Bed confinement status: Secondary | ICD-10-CM | POA: Diagnosis not present

## 2020-10-25 DIAGNOSIS — D631 Anemia in chronic kidney disease: Secondary | ICD-10-CM | POA: Diagnosis not present

## 2020-10-25 DIAGNOSIS — N2581 Secondary hyperparathyroidism of renal origin: Secondary | ICD-10-CM | POA: Diagnosis not present

## 2020-10-25 DIAGNOSIS — Z992 Dependence on renal dialysis: Secondary | ICD-10-CM | POA: Diagnosis not present

## 2020-10-26 DIAGNOSIS — I10 Essential (primary) hypertension: Secondary | ICD-10-CM | POA: Diagnosis not present

## 2020-10-27 DIAGNOSIS — I10 Essential (primary) hypertension: Secondary | ICD-10-CM | POA: Diagnosis not present

## 2020-10-28 ENCOUNTER — Emergency Department (HOSPITAL_COMMUNITY): Payer: Medicare HMO

## 2020-10-28 ENCOUNTER — Emergency Department (HOSPITAL_COMMUNITY)
Admission: EM | Admit: 2020-10-28 | Discharge: 2020-10-30 | Disposition: A | Discharge status: Home / Self Care | Payer: Medicare HMO | Attending: Emergency Medicine | Admitting: Emergency Medicine

## 2020-10-28 ENCOUNTER — Encounter (HOSPITAL_COMMUNITY): Payer: Self-pay | Admitting: Emergency Medicine

## 2020-10-28 ENCOUNTER — Other Ambulatory Visit: Payer: Self-pay

## 2020-10-28 DIAGNOSIS — Y9289 Other specified places as the place of occurrence of the external cause: Secondary | ICD-10-CM | POA: Diagnosis not present

## 2020-10-28 DIAGNOSIS — I120 Hypertensive chronic kidney disease with stage 5 chronic kidney disease or end stage renal disease: Secondary | ICD-10-CM | POA: Diagnosis not present

## 2020-10-28 DIAGNOSIS — Z89612 Acquired absence of left leg above knee: Secondary | ICD-10-CM | POA: Insufficient documentation

## 2020-10-28 DIAGNOSIS — Z992 Dependence on renal dialysis: Secondary | ICD-10-CM | POA: Insufficient documentation

## 2020-10-28 DIAGNOSIS — I10 Essential (primary) hypertension: Secondary | ICD-10-CM | POA: Diagnosis not present

## 2020-10-28 DIAGNOSIS — Y9389 Activity, other specified: Secondary | ICD-10-CM | POA: Diagnosis not present

## 2020-10-28 DIAGNOSIS — Z9889 Other specified postprocedural states: Secondary | ICD-10-CM | POA: Diagnosis not present

## 2020-10-28 DIAGNOSIS — U071 COVID-19: Secondary | ICD-10-CM | POA: Diagnosis not present

## 2020-10-28 DIAGNOSIS — N186 End stage renal disease: Secondary | ICD-10-CM | POA: Diagnosis not present

## 2020-10-28 DIAGNOSIS — Z79899 Other long term (current) drug therapy: Secondary | ICD-10-CM | POA: Diagnosis not present

## 2020-10-28 DIAGNOSIS — E1122 Type 2 diabetes mellitus with diabetic chronic kidney disease: Secondary | ICD-10-CM | POA: Insufficient documentation

## 2020-10-28 DIAGNOSIS — E875 Hyperkalemia: Secondary | ICD-10-CM | POA: Insufficient documentation

## 2020-10-28 DIAGNOSIS — R000 Tachycardia, unspecified: Secondary | ICD-10-CM | POA: Diagnosis not present

## 2020-10-28 DIAGNOSIS — I517 Cardiomegaly: Secondary | ICD-10-CM | POA: Diagnosis not present

## 2020-10-28 DIAGNOSIS — Y999 Unspecified external cause status: Secondary | ICD-10-CM | POA: Diagnosis not present

## 2020-10-28 DIAGNOSIS — R69 Illness, unspecified: Secondary | ICD-10-CM | POA: Diagnosis not present

## 2020-10-28 DIAGNOSIS — Z043 Encounter for examination and observation following other accident: Secondary | ICD-10-CM | POA: Diagnosis not present

## 2020-10-28 DIAGNOSIS — R5381 Other malaise: Secondary | ICD-10-CM | POA: Diagnosis not present

## 2020-10-28 DIAGNOSIS — Z794 Long term (current) use of insulin: Secondary | ICD-10-CM | POA: Diagnosis not present

## 2020-10-28 DIAGNOSIS — W19XXXA Unspecified fall, initial encounter: Secondary | ICD-10-CM | POA: Diagnosis not present

## 2020-10-28 DIAGNOSIS — M79605 Pain in left leg: Secondary | ICD-10-CM | POA: Diagnosis present

## 2020-10-28 DIAGNOSIS — Z4800 Encounter for change or removal of nonsurgical wound dressing: Secondary | ICD-10-CM | POA: Diagnosis not present

## 2020-10-28 DIAGNOSIS — I129 Hypertensive chronic kidney disease with stage 1 through stage 4 chronic kidney disease, or unspecified chronic kidney disease: Secondary | ICD-10-CM | POA: Diagnosis not present

## 2020-10-28 DIAGNOSIS — Z4801 Encounter for change or removal of surgical wound dressing: Secondary | ICD-10-CM | POA: Insufficient documentation

## 2020-10-28 DIAGNOSIS — Z5189 Encounter for other specified aftercare: Secondary | ICD-10-CM

## 2020-10-28 LAB — CBG MONITORING, ED: Glucose-Capillary: 176 mg/dL — ABNORMAL HIGH (ref 70–99)

## 2020-10-28 MED ORDER — HYDROMORPHONE HCL 1 MG/ML IJ SOLN
1.0000 mg | Freq: Once | INTRAMUSCULAR | Status: AC
  Administered 2020-10-28: 1 mg via INTRAVENOUS
  Filled 2020-10-28: qty 1

## 2020-10-28 MED ORDER — HYDROMORPHONE HCL 1 MG/ML IJ SOLN
1.0000 mg | Freq: Once | INTRAMUSCULAR | Status: AC
  Administered 2020-10-28: 1 mg via INTRAMUSCULAR
  Filled 2020-10-28: qty 1

## 2020-10-28 NOTE — ED Provider Notes (Signed)
[facility] EMERGENCY DEPARTMENT Provider Note   CSN: [id number] Arrival date & time: 10/28/20  1147     History Chief Complaint  Patient presents with  . Wound Check    [name] is a 40 y.o. male.  40 year old male with prior medical history as detailed below presents for evaluation.  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  Patient reports that he fell on it last night.  He has increased pain and bleeding from the wound.  He denies fever.  He reports that he was due for dialysis today.  He missed dialysis secondary to being in the ER waiting room.  He request Dilaudid for treatment of his pain.  He denies other injury when he fell yesterday evening.  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 Oneida Alar.  Patient was discharged on the 1/25.  The history is provided by the patient and medical records.  Illness Location:  Left AKA bleeding/pain - missed dialysis  Severity:  Moderate Onset quality:  Sudden Duration:  1 day Timing:  Constant Progression:  Unchanged Chronicity:  New Associated symptoms: no fever and no shortness of breath        Past Medical History:  Diagnosis Date  . Anemia   . Diabetes mellitus without complication ([place])   . Diabetic gastroparesis ([place])   . Dialysis patient ([place])   . Hypertension   . Renal disorder    Dialysis  . Sepsis [facility])     Patient Active Problem List   Diagnosis Date Noted  . Severe comorbid illness   . Pressure injury of skin 10/15/2020  . Wound infection 10/14/2020  . Wound dehiscence   . Adjustment disorder, unspecified 09/21/2020  . Malnutrition of moderate degree 09/13/2020  . Osteomyelitis of left foot ([place]) 09/11/2020  . Volume overload 07/22/2020  . Hypocalcemia 06/07/2020  . Diabetic ulcer of heel ([place]) 05/31/2020  . Type 2 diabetes mellitus with foot ulcer ([place]) 04/23/2020  . COVID-19 02/20/2020  . Personal history of COVID-19 02/20/2020  . Diabetic ulcer of left midfoot associated  with type 1 diabetes mellitus, with fat layer exposed ([place])   . Charcot's joint of foot, left   . Pneumonia due to COVID-19 virus 02/14/2020  . Cellulitis of left foot 01/31/2020  . Allergy, unspecified, initial encounter 07/17/2019  . Diarrhea 04/24/2019  . Bone erosion determined by x-ray   . Encephalopathy acute   . Diabetic ulcer of left midfoot associated with diabetes mellitus due to underlying condition, with necrosis of bone ([place])   . Open wound of left foot   . Encounter for orthopedic aftercare following surgical amputation   . Foot infection   . Gangrene of toe of left foot ([place])   . Cellulitis 10/26/2018  . Diabetic ulcer of left midfoot associated with diabetes mellitus due to underlying condition, with bone involvement without evidence of necrosis ([place])   . Nausea vomiting and diarrhea 10/20/2018  . DM (diabetes mellitus), secondary, uncontrolled, with neurologic complications ([place])   . Headache 09/23/2018  . Acute osteomyelitis involving ankle and foot, left ([place])   . Anemia of chronic disease 08/18/2018  . Encounter for immunization 06/29/2018  . Hyperkalemia   . Unspecified protein-calorie malnutrition ([place]) 02/14/2018  . Encounter for removal of sutures 02/07/2018  . Fluid overload, unspecified 01/22/2018  . Osteomyelitis of ankle or foot, acute, right ([place]) 10/30/2017  . Cellulitis and abscess of toe of left foot   . Diabetic foot infection ([place])   . Insulin dependent type  1 diabetes mellitus ([place]) 10/25/2017  . Diabetic gastroparesis ([place]) 10/25/2017  . Sepsis ([place]) 10/25/2017  . Fever, unspecified 10/28/2016  . Iron deficiency anemia, unspecified 10/28/2016  . Other specified coagulation defects ([place]) 10/28/2016  . Pruritus, unspecified 10/28/2016  . Secondary hyperparathyroidism of renal origin ([place]) 10/28/2016  . Shortness of breath 10/28/2016  . HTN (hypertension) 08/09/2014  . ESRD on dialysis ([place]) 03/19/2014  . Metabolic acidosis 75/44/9201    Past  Surgical History:  Procedure Laterality Date  . AMPUTATION Right 02/02/2018   Procedure: RIGHT FIFTH TOE AND METATARSAL AMPUTATION. Filetted toe flap metatarsal resection. Debridement Plantar Foot wound;  Surgeon: Evelina Bucy, DPM;  Location: [facility];  Service: Podiatry;  Laterality: Right;  . AMPUTATION Left 08/20/2018   Procedure: FIFTH METATARSAL BONE BIOPSY;  Surgeon: Evelina Bucy, DPM;  Location: [facility];  Service: Podiatry;  Laterality: Left;  . AMPUTATION Left 10/28/2018   Procedure: LEFT GREAT TOE AMPUTATION;  Surgeon: Evelina Bucy, DPM;  Location: [facility];  Service: Podiatry;  Laterality: Left;  . AMPUTATION Left 09/16/2020   Procedure: AMPUTATION BELOW KNEE;  Surgeon: Rosetta Posner, MD;  Location: [facility];  Service: Vascular;  Laterality: Left;  . AMPUTATION Left 10/15/2020   Procedure: AMPUTATION ABOVE KNEE;  Surgeon: Elam Dutch, MD;  Location: [facility];  Service: Vascular;  Laterality: Left;  . APPLICATION OF WOUND VAC  02/02/2018   Procedure: APPLICATION OF WOUND VAC  Right Foot;  Surgeon: Evelina Bucy, DPM;  Location: [facility];  Service: Podiatry;;  . APPLICATION OF WOUND VAC Left 10/28/2018   Procedure: APPLICATION OF WOUND VAC LEFT TOE;  Surgeon: Evelina Bucy, DPM;  Location: [facility];  Service: Podiatry;  Laterality: Left;  . APPLICATION OF WOUND VAC Left 11/01/2018   Procedure: APPLICATION OF WOUND VAC;  Surgeon: Evelina Bucy, DPM;  Location: [facility];  Service: Podiatry;  Laterality: Left;  . AV FISTULA PLACEMENT     left arm.  . AV FISTULA PLACEMENT Right 12/22/2016   Procedure: INSERTION OF ARTERIOVENOUS (AV) GORE-TEX GRAFT ARM;  Surgeon: Elam Dutch, MD;  Location: [facility] OR;  Service: Vascular;  Laterality: Right;  . AV FISTULA PLACEMENT Left 05/26/2018   Procedure: INSERTION OF  ARTERIOVENOUS (AV) GORE-TEX GRAFT LEFT ARM;  Surgeon: Serafina Mitchell, MD;  Location: [facility];  Service: Vascular;  Laterality: Left;  . EYE SURGERY    . I & D EXTREMITY Right 10/31/2017    Procedure: IRRIGATION AND DEBRIDEMENT RIGHT FOOT;  Surgeon: Evelina Bucy, DPM;  Location: [facility];  Service: Podiatry;  Laterality: Right;  . I & D EXTREMITY Left 08/20/2018   Procedure: IRRIGATION AND DEBRIDEMENT EXTREMITY WITH SECONDARY WOUND CLOSUREAND APPLICATION OF WOUND VAC LEFT FOOT;  Surgeon: Evelina Bucy, DPM;  Location: [facility];  Service: Podiatry;  Laterality: Left;  . I & D EXTREMITY Left 10/20/2018   Procedure: IRRIGATION AND DEBRIDEMENT LEFT FOOT  DEBRIDEMENT LATERAL FOOT WOUND;  Surgeon: Evelina Bucy, DPM;  Location: [facility];  Service: Podiatry;  Laterality: Left;  . I & D EXTREMITY Left 10/28/2018   Procedure: IRRIGATION AND DEBRIDEMENT LEFT TOE;  Surgeon: Evelina Bucy, DPM;  Location: [facility];  Service: Podiatry;  Laterality: Left;  . I & D EXTREMITY Left 09/14/2020   Procedure: IRRIGATION AND DEBRIDEMENT WRIST;  Surgeon: Leanora Cover, MD;  Location: [facility];  Service: Orthopedics;  Laterality: Left;  . INSERTION OF DIALYSIS CATHETER     Right subclavian  .  IR AV DIALY SHUNT INTRO NEEDLE/INTRACATH INITIAL W/PTA/IMG RIGHT Right 02/05/2018  . IR FLUORO GUIDE CV LINE RIGHT  01/31/2020  . IR THROMBECTOMY AV FISTULA W/THROMBOLYSIS/PTA INC/SHUNT/IMG LEFT Left 08/24/2018  . IR THROMBECTOMY AV FISTULA W/THROMBOLYSIS/PTA INC/SHUNT/IMG LEFT Left 01/06/2019  . IR US GUIDE VASC ACCESS LEFT  08/24/2018  . IR US GUIDE VASC ACCESS RIGHT  02/05/2018  . IR US GUIDE VASC ACCESS RIGHT  01/31/2020  . IRRIGATION AND DEBRIDEMENT FOOT Right 10/23/2018   Procedure: Irrigation and Debridement to tendon, Left Foot;  Surgeon: Price, Michael J, DPM;  Location: [facility];  Service: Podiatry;  Laterality: Right;  . IRRIGATION AND DEBRIDEMENT FOOT Left 11/01/2018   Procedure: IRRIGATION AND DEBRIDEMENT PARTIAL WOUND CLOSURE LOCAL TISSUE TRANSFER AND FLAP ROTATION, LEFT FOOT;  Surgeon: Price, Michael J, DPM;  Location: [facility] OR;  Service: Podiatry;  Laterality: Left;  . TRANSMETATARSAL AMPUTATION N/A 08/18/2018    Procedure: IRRIGATION AND DEBRIDEMENT OF LEFT 5TH TOE AND TRANSMETATARSAL, WITH PARTICAL LEFT 5TH TOE AND METATARSAL AMPUTATION, BONE BIOPSY, WOUND VAC APPLICATION.;  Surgeon: Price, Michael J, DPM;  Location: [facility];  Service: Podiatry;  Laterality: N/A;  . UPPER EXTREMITY VENOGRAPHY N/A 11/16/2016   Procedure: Upper Extremity Venography - Right Central;  Surgeon: Charles E Fields, MD;  [facility];  Service: Cardiovascular;  Laterality: N/A;  . UPPER EXTREMITY VENOGRAPHY N/A 05/25/2018   Procedure: UPPER EXTREMITY VENOGRAPHY - Bilateral;  Surgeon: Clark, Christopher J, MD;  [facility]: [facility];  Service: Cardiovascular;  Laterality: N/A;       Family History  Problem Relation Age of Onset  . Diabetes Mellitus II Other   . Diabetes Father   . Renal Disease Father        ESRD    Social History   Tobacco Use  . Smoking status: Never Smoker  . Smokeless tobacco: Never Used  Vaping Use  . Vaping Use: Never used  Substance Use Topics  . Alcohol use: No  . Drug use: No    Home Medications Prior to Admission medications   Medication Sig Start Date End Date Taking? Authorizing Provider  albuterol (VENTOLIN HFA) 108 (90 Base) MCG/ACT inhaler Inhale 2 puffs into the lungs every 6 (six) hours as needed for wheezing or shortness of breath. 02/18/20   Singh, Prashant K, MD  Blood Glucose Monitoring Suppl (ONETOUCH VERIO FLEX SYSTEM) w/Device KIT 1 Bag by Does not apply route 3 (three) times daily before meals. 05/02/20   Edwards, [name] P, NP  calcitRIOL (ROCALTROL) 0.5 MCG capsule Take 5 capsules (2.5 mcg total) by mouth every Monday, Wednesday, and Friday with hemodialysis. 10/26/18   Hongalgi, Anand D, MD  cinacalcet (SENSIPAR) 60 MG tablet Take 120 mg by mouth daily after supper. 04/24/20   [provider]  glucose blood (ONETOUCH VERIO) test strip Check blood sugars three times daily 07/01/20   Edwards, [name] P, NP  ibuprofen (ADVIL) 600 MG tablet Take 1  tablet (600 mg total) by mouth every 6 (six) hours as needed for up to 30 doses for moderate pain. 10/11/20   Kc, Ramesh, MD  Lancets (ONETOUCH DELICA PLUS LANCET33G) MISC Apply topically. 05/02/20   [provider]  Methoxy PEG-Epoetin Beta (MIRCERA IJ)  04/19/20 04/18/21  [provider]  NOVOLOG 100 UNIT/ML injection Inject 6 Units into the skin 3 (three) times daily with meals. INJECT 6 UNITS SUBCUTANEOUSLY TWICE DAILY WITH A MEAL 10/20/20   Joseph, Preetha, MD  oxyCODONE-acetaminophen (PERCOCET) 5-325 MG tablet   Take 2 tablets by mouth every 6 (six) hours as needed for up to 15 doses for severe pain. 10/20/20   Domenic Polite, MD  sevelamer carbonate (RENVELA) 800 MG tablet Take 2,400-4,000 mg by mouth See admin instructions. Take 4,000 mg (5 tablets) by mouth with meals and 2,400 mg (3 tablets) with snacks 04/27/19   [provider]    Allergies    Patient has no known allergies.  Review of Systems   Review of Systems  Constitutional: Negative for fever.  Respiratory: Negative for shortness of breath.   All other systems reviewed and are negative.   Physical Exam Updated Vital Signs BP 124/88 (BP Location: Right Arm)   Pulse 88   Temp 99.1 F (37.3 C) (Oral)   Resp 18   Ht 5' 6" (1.676 m)   Wt 81.6 kg   SpO2 100%   BMI 29.05 kg/m   Physical Exam Vitals and nursing note reviewed.  Constitutional:      General: He is not in acute distress.    Appearance: He is well-developed and well-nourished.  HENT:     Head: Normocephalic and atraumatic.     Mouth/Throat:     Mouth: Oropharynx is clear and moist.  Eyes:     Extraocular Movements: EOM normal.     Conjunctiva/sclera: Conjunctivae normal.     Pupils: Pupils are equal, round, and reactive to light.  Cardiovascular:     Rate and Rhythm: Normal rate and regular rhythm.     Heart sounds: Normal heart sounds.  Pulmonary:     Effort: Pulmonary effort is normal. No respiratory distress.     Breath  sounds: Normal breath sounds.  Abdominal:     General: There is no distension.     Palpations: Abdomen is soft.     Tenderness: There is no abdominal tenderness.  Musculoskeletal:        General: No deformity or edema. Normal range of motion.     Cervical back: Normal range of motion and neck supple.     Comments: Post left AKA - Image below  Skin:    General: Skin is warm and dry.  Neurological:     Mental Status: He is alert and oriented to person, place, and time.  Psychiatric:        Mood and Affect: Mood and affect normal.         ED Results / Procedures / Treatments   Labs (all labs ordered are listed, but only abnormal results are displayed) Labs Reviewed  CBG MONITORING, ED - Abnormal; Notable for the following components:      Result Value   Glucose-Capillary 176 (*)    All other components within normal limits  BASIC METABOLIC PANEL  CBC WITH DIFFERENTIAL/PLATELET    EKG None  Radiology No results found.  Procedures Procedures   Medications Ordered in ED Medications  HYDROmorphone (DILAUDID) injection 1 mg (has no administration in time range)    ED Course  I have reviewed the triage vital signs and the nursing notes.  Pertinent labs & imaging results that were available during my care of the patient were reviewed by me and considered in my medical decision making (see chart for details).    MDM Rules/Calculators/A&P                          MDM  Screen complete  [name] was evaluated in Emergency Department on 10/28/2020 for the  symptoms described in the history of present illness. He was evaluated in the context of the global COVID-19 pandemic, which necessitated consideration that the patient might be at risk for infection with the SARS-CoV-2 virus that causes COVID-19. Institutional protocols and algorithms that pertain to the evaluation of patients at risk for COVID-19 are in a state of rapid change based on information released by  regulatory bodies including the CDC and federal and state organizations. These policies and algorithms were followed during the patient's care in the ED.  Patient is presenting with 2 complaints.    Patient reports increased pain to the left AKA stump after reported fall.    Patient also reports that he missed dialysis today.  He reports that he was in the ED waiting room and could not make it to dialysis.  Case and left AKA wound discussed with Dr. Clark vascular.  Dr. Clark does not feel that the patient requires immediate evaluation in the ED.  He would be happy to follow along if the patient is admitted.  Imaging without significant abnormality.   Labs pending.   Patient reporting that he will have difficulty making it to make-up dialysis session.  Dr. Wickline aware of pending labs and dispo.  Final Clinical Impression(s) / ED Diagnoses Final diagnoses:  Visit for wound check  ESRD (end stage renal disease) ([name])  Hyperkalemia    Rx / DC Orders ED Discharge Orders    None       ,  C, MD 10/29/20 1641  

## 2020-10-28 NOTE — ED Notes (Signed)
Pt refusing EKG at this time. Pt irritable and and being verbally aggressive w/ this RN because I don't see any veins to stick for an IV for the pt d/t his edema. Pt states, "You aren't going to just try". I explained that I'm not going to stick just to stick when I don't see anything". Pt then attempted to refuse BP cuff being reapplied. Pt reports fistulas in bilateral arms don't work. Notified MD and put in consult for IV team.

## 2020-10-28 NOTE — ED Notes (Signed)
Refused vitals 

## 2020-10-28 NOTE — ED Notes (Signed)
IV team at bedside 

## 2020-10-28 NOTE — ED Triage Notes (Addendum)
Pt arrives via EMS - recent L BKA. Complains of "popping a few stitches". Bleeding controlled and pt has clean, dry dressing in place. It appears he has done this several times since the operation. Pt refused all vitals for EMS. Pt also states this is his day for dialysis- MWF.

## 2020-10-29 DIAGNOSIS — Z7401 Bed confinement status: Secondary | ICD-10-CM | POA: Diagnosis not present

## 2020-10-29 DIAGNOSIS — M2550 Pain in unspecified joint: Secondary | ICD-10-CM | POA: Diagnosis not present

## 2020-10-29 DIAGNOSIS — R2689 Other abnormalities of gait and mobility: Secondary | ICD-10-CM | POA: Diagnosis not present

## 2020-10-29 DIAGNOSIS — I959 Hypotension, unspecified: Secondary | ICD-10-CM | POA: Diagnosis not present

## 2020-10-29 DIAGNOSIS — Z743 Need for continuous supervision: Secondary | ICD-10-CM | POA: Diagnosis not present

## 2020-10-29 DIAGNOSIS — U071 COVID-19: Secondary | ICD-10-CM | POA: Diagnosis not present

## 2020-10-29 DIAGNOSIS — I10 Essential (primary) hypertension: Secondary | ICD-10-CM | POA: Diagnosis not present

## 2020-10-29 LAB — CBC WITH DIFFERENTIAL/PLATELET
Abs Immature Granulocytes: 0.05 10*3/uL (ref 0.00–0.07)
Basophils Absolute: 0 10*3/uL (ref 0.0–0.1)
Basophils Relative: 0 %
Eosinophils Absolute: 0.5 10*3/uL (ref 0.0–0.5)
Eosinophils Relative: 4 %
HCT: 26.6 % — ABNORMAL LOW (ref 39.0–52.0)
Hemoglobin: 7.8 g/dL — ABNORMAL LOW (ref 13.0–17.0)
Immature Granulocytes: 1 %
Lymphocytes Relative: 9 %
Lymphs Abs: 1 10*3/uL (ref 0.7–4.0)
MCH: 28.2 pg (ref 26.0–34.0)
MCHC: 29.3 g/dL — ABNORMAL LOW (ref 30.0–36.0)
MCV: 96 fL (ref 80.0–100.0)
Monocytes Absolute: 0.7 10*3/uL (ref 0.1–1.0)
Monocytes Relative: 7 %
Neutro Abs: 8.7 10*3/uL — ABNORMAL HIGH (ref 1.7–7.7)
Neutrophils Relative %: 79 %
Platelets: 262 10*3/uL (ref 150–400)
RBC: 2.77 MIL/uL — ABNORMAL LOW (ref 4.22–5.81)
RDW: 16.5 % — ABNORMAL HIGH (ref 11.5–15.5)
WBC: 11 10*3/uL — ABNORMAL HIGH (ref 4.0–10.5)
nRBC: 0 % (ref 0.0–0.2)

## 2020-10-29 LAB — CBG MONITORING, ED
Glucose-Capillary: 260 mg/dL — ABNORMAL HIGH (ref 70–99)
Glucose-Capillary: 237 mg/dL — ABNORMAL HIGH (ref 70–99)
Glucose-Capillary: 258 mg/dL — ABNORMAL HIGH (ref 70–99)
Glucose-Capillary: 197 mg/dL — ABNORMAL HIGH (ref 70–99)

## 2020-10-29 LAB — BASIC METABOLIC PANEL
Anion gap: 16 — ABNORMAL HIGH (ref 5–15)
Anion gap: 17 — ABNORMAL HIGH (ref 5–15)
BUN: 71 mg/dL — ABNORMAL HIGH (ref 6–20)
BUN: 76 mg/dL — ABNORMAL HIGH (ref 6–20)
CO2: 21 mmol/L — ABNORMAL LOW (ref 22–32)
CO2: 21 mmol/L — ABNORMAL LOW (ref 22–32)
Calcium: 6.6 mg/dL — ABNORMAL LOW (ref 8.9–10.3)
Calcium: 6.8 mg/dL — ABNORMAL LOW (ref 8.9–10.3)
Chloride: 91 mmol/L — ABNORMAL LOW (ref 98–111)
Chloride: 92 mmol/L — ABNORMAL LOW (ref 98–111)
Creatinine, Ser: 8.84 mg/dL — ABNORMAL HIGH (ref 0.61–1.24)
Creatinine, Ser: 9.24 mg/dL — ABNORMAL HIGH (ref 0.61–1.24)
GFR, Estimated: 7 mL/min — ABNORMAL LOW (ref >60)
GFR, Estimated: 7 mL/min — ABNORMAL LOW (ref >60)
Glucose, Bld: 230 mg/dL — ABNORMAL HIGH (ref 70–99)
Glucose, Bld: 244 mg/dL — ABNORMAL HIGH (ref 70–99)
Potassium: 6.5 mmol/L (ref 3.5–5.1)
Potassium: 6.9 mmol/L (ref 3.5–5.1)
Sodium: 128 mmol/L — ABNORMAL LOW (ref 135–145)
Sodium: 130 mmol/L — ABNORMAL LOW (ref 135–145)

## 2020-10-29 LAB — SARS CORONAVIRUS 2 BY RT PCR (HOSPITAL ORDER, PERFORMED IN ~~LOC~~ HOSPITAL LAB): SARS Coronavirus 2: POSITIVE — AB

## 2020-10-29 MED ORDER — CHLORHEXIDINE GLUCONATE CLOTH 2 % EX PADS: 6.0000 | MEDICATED_PAD | Freq: Every day | CUTANEOUS | Status: DC

## 2020-10-29 MED ORDER — HEPARIN SODIUM (PORCINE) 1000 UNIT/ML IJ SOLN
1000.0000 [IU] | INTRAMUSCULAR | Status: DC | PRN
  Filled 2020-10-29: qty 1

## 2020-10-29 MED ORDER — HEPARIN SODIUM (PORCINE) 1000 UNIT/ML IJ SOLN
INTRAMUSCULAR | Status: AC
  Administered 2020-10-29: 3800 [IU] via INTRAVENOUS
  Filled 2020-10-29: qty 4

## 2020-10-29 MED ORDER — SODIUM ZIRCONIUM CYCLOSILICATE 10 G PO PACK
10.0000 g | PACK | Freq: Once | ORAL | Status: AC
  Administered 2020-10-29: 10 g via ORAL
  Filled 2020-10-29: qty 1

## 2020-10-29 MED ORDER — OXYCODONE-ACETAMINOPHEN 5-325 MG PO TABS: 2.0000 | ORAL_TABLET | Freq: Four times a day (QID) | ORAL | 0 refills | Status: DC | PRN

## 2020-10-29 MED ORDER — DEXTROSE 50 % IV SOLN
1.0000 | Freq: Once | INTRAVENOUS | Status: AC
  Administered 2020-10-29: 25 mL via INTRAVENOUS
  Filled 2020-10-29: qty 50

## 2020-10-29 MED ORDER — INSULIN ASPART 100 UNIT/ML IV SOLN
5.0000 [IU] | Freq: Once | INTRAVENOUS | Status: AC
  Administered 2020-10-29: 5 [IU] via INTRAVENOUS

## 2020-10-29 MED ORDER — OXYCODONE-ACETAMINOPHEN 5-325 MG PO TABS
2.0000 | ORAL_TABLET | ORAL | Status: DC | PRN
  Administered 2020-10-29 (×5): 2 via ORAL
  Filled 2020-10-29 (×6): qty 2

## 2020-10-29 NOTE — ED Provider Notes (Signed)
I assumed care in signout to follow-up on labs.  Patient came to the ED to have his amputation site evaluated.  While waiting the ED, he missed his dialysis session.  Labs reveal hyperkalemia without acute EKG changes. D/w Dr. Candiss Norse with nephrology.  Plan to give medications here in the ED for hyperkalemia, patient will get dialyzed later in the morning.  Anticipate discharge after dialysis.  Patient was updated on plan BP (!) 117/50   Pulse (!) 107   Temp 99.1 F (37.3 C) (Oral)   Resp 17   Ht 1.676 m (5\' 6" )   Wt 81.6 kg   SpO2 97%   BMI 29.05 kg/m  Patient is requesting pain medications.  I evaluated his left leg stump, no active bleeding Home pain medications have been ordered    EKG Interpretation  Date/Time:  Monday October 28 2020 22:24:34 EST Ventricular Rate:  103 PR Interval:    QRS Duration: 92 QT Interval:  377 QTC Calculation: 494 R Axis:   83 Text Interpretation: Sinus tachycardia Low voltage, precordial leads Anteroseptal infarct, old Confirmed by Dene Gentry [phone]) on 10/28/2020 10:25:33 PM       Medications  sodium zirconium cyclosilicate (LOKELMA) packet 10 g (has no administration in time range)  insulin aspart (novoLOG) injection 5 Units (has no administration in time range)    And  dextrose 50 % solution 50 mL (has no administration in time range)  oxyCODONE-acetaminophen (PERCOCET/ROXICET) 5-325 MG per tablet 2 tablet (has no administration in time range)  Chlorhexidine Gluconate Cloth 2 % PADS 6 each (has no administration in time range)  HYDROmorphone (DILAUDID) injection 1 mg (1 mg Intravenous Given 10/28/20 2342)  HYDROmorphone (DILAUDID) injection 1 mg (1 mg Intramuscular Given 10/28/20 2225)   CRITICAL CARE Performed by: Sharyon Cable Total critical care time: 33 minutes Critical care time was exclusive of separately billable procedures and treating other patients. Critical care was necessary to treat or prevent imminent or  life-threatening deterioration. Critical care was time spent personally by me on the following activities: development of treatment plan with patient and/or surrogate as well as nursing, discussions with consultants, evaluation of patient's response to treatment, examination of patient, obtaining history from patient or surrogate, ordering and performing treatments and interventions, ordering and review of laboratory studies, ordering and review of radiographic studies, pulse oximetry and re-evaluation of patient's condition.    Ripley Fraise, MD 10/29/20 [phone]

## 2020-10-29 NOTE — ED Notes (Signed)
Patient is now 3 on the list for ptar

## 2020-10-29 NOTE — Discharge Instructions (Addendum)
  Follow-up Information         [facility]. Call.   Why: to follow up on wheelchair options Contact information: [phone]               Kerin Perna, NP. Schedule an appointment as soon as possible for a visit in 1 week(s).   Specialty: Internal Medicine Contact information: [address] [address] [phone]                 Elam Dutch, MD. Go in 1 week(s).   Specialties: Vascular Surgery, Cardiology Contact information: [address] [place] [address] [phone]

## 2020-10-29 NOTE — ED Notes (Signed)
Hemodialysis RN, Derald Macleod, called requesting report. States he spoke with charge RN on HD unit regarding patient's COVID positive status. HD unable to accommodate patient this AM due to COVID. HD to contact ED RN later when they can dialyze patient without exposing others.

## 2020-10-29 NOTE — ED Notes (Signed)
Patient refusing vitals

## 2020-10-29 NOTE — Progress Notes (Signed)
Renal Navigator notes patient's COVID positive test result from 10/28/20. He will require HD treatment in isolation at his outpatient HD clinic/ [facility] on a TTS schedule at 4:45pm for at least 14 days past positive test at discharge. He will not be able to ride public transportation until he returns to his regular shift.  Navigator will follow.   Alphonzo Cruise, [place] Renal Navigator [phone]

## 2020-10-29 NOTE — ED Notes (Signed)
Patient refusing vitals at this time.

## 2020-10-29 NOTE — ED Notes (Signed)
[name], Rodena Piety, calling for update. Patient states he will call and update her on dialysis plan.

## 2020-10-29 NOTE — ED Notes (Signed)
Patient upset, refusing to wear monitoring equipment at this time. Patient states he does not want any more wires on him and does not want the food he was provided. MD notified.

## 2020-10-29 NOTE — ED Notes (Signed)
Tried to put patient back on monitor. Pt said to let his nurse know he REFUSES TO PUT THE WIRES BACK ON!

## 2020-10-29 NOTE — ED Notes (Signed)
Dinner Tray Ordered @ 1651. 

## 2020-10-29 NOTE — Progress Notes (Signed)
40 year old male well-known to vascular surgery that recently underwent a below knee amputation that had to be converted to an above-knee amputation on 10/15/2020 by Dr. Oneida Alar after the patient fell and his below-knee amputation dehisced.  He reported to the ED last night with another mechanical fall at home.  All the staples are intact except for one in his left AKA.  Overall the stump is pretty soft.  He may have some hematoma but I would not do anything surgically at this time and just dressing care for the drainage.  I did change his dressing in the ED.  Amputation appears viable at this time.  He is being admitted due to hyperkalemia and missed dialysis.  Marty Heck, MD [facility] Office: [place]

## 2020-10-29 NOTE — ED Provider Notes (Signed)
I was called to this pt bedside after he returnd from dialysis, his concerns now for pain control and follow-up for his wounds.  We will dress his wounds today.  And we will discharge him given the appropriate discharge instructions.  He has not had follow-up for chronic pain management so I will give him a short prescription for pain control.  However I have told him that this is last time he will get chronic pain management here as he needs to follow-up with the providers given on his discharge summary.  He understands this and will be discharged home.  All wounds are dressed looked by myself and they look stable.  He is given follow-up for vascular surgery internal medicine.   Breck Coons, MD 10/29/20 [phone]

## 2020-10-29 NOTE — Care Management (Incomplete)
ED RNCM met with patient at bedside, Patient states his nurse has been changing his dressing daily. He is also requesting  a bedside commode for home use.  RNCM placed order with [facility]patient Aide came in to transport commode home.

## 2020-10-29 NOTE — Procedures (Signed)
I was present at this dialysis session. I have reviewed the session itself and made appropriate changes.   Vital signs in last 24 hours:  Temp:  [97.9 F (36.6 C)-99.1 F (37.3 C)] 99 F (37.2 C) (02/01 1215) Pulse Rate:  [63-107] 63 (02/01 1430) Resp:  [9-23] 22 (02/01 1230) BP: (94-161)/(36-98) 103/46 (02/01 1430) SpO2:  [91 %-100 %] 98 % (02/01 1215) Weight change:  Filed Weights   10/28/20 1149  Weight: 81.6 kg    Recent Labs  Lab 10/29/20 0844  NA 128*  K 6.5*  CL 91*  CO2 21*  GLUCOSE 230*  BUN 76*  CREATININE 9.24*  CALCIUM 6.6*    Recent Labs  Lab 10/28/20 2331  WBC 11.0*  NEUTROABS 8.7*  HGB 7.8*  HCT 26.6*  MCV 96.0  PLT 262    Scheduled Meds: . Chlorhexidine Gluconate Cloth  6 each Topical Q0600  . heparin sodium (porcine)       Continuous Infusions: PRN Meds:.heparin sodium (porcine), oxyCODONE-acetaminophen    Dialysis Orders: MWF @ [facility] 4h 54min 400/800 67kg TDC Hep 5000 + 5000 midrun - Calcitriol 28mcg PO q HD - No ESA Noted scheduled for [facility] AVF on 09/24/20, canceled/delayed per patient request.  Assessment/Plan: 1. Hyperkalemia-ongoing issue with noncompliance with diet and dialysis.  Plan for urgent HD but will be shortened due to high census. 2. Left BKA wound dehiscence: S/pAKA 1/18by Dr. Oneida Alar.Per vascular 3. ESRD: Usual MWF schedule- but   4. Anemia: Hgb 7.6, continueAranesp 150 q Fridays.1 unit pRBCgiven1/20,tsat 7%,IV iron ordered. 5. Secondary hyperparathyroidism: CorrCa ok. Continue binders, VDRA and sensipar 6. HTN/volume -BPat goal.Ongoing large gains, up about 7kg.UF as tolerated.  7. Nutrition- lastalbumin 2.5. Patient refuses renal carb diet always asked for regular. Provided education about following renal diet.  8. Bipolar disorder underlying personality disorder-Meds Per admit 9. Type II DM - chronically uncontrolled plan Per admit 10. covid-19+ will need to arrange for covid shift as an  outpatient as this is a new diagnosis  11. Disposition- hopeful discharge to home after HD and will need to follow up with his outpatient unit.   Donetta Potts,  MD 10/29/2020, 2:34 PM

## 2020-10-29 NOTE — Progress Notes (Signed)
Discussed case with ER MD. H/o ESRD with recent left AKA. Presented here for leg pain and pain medications. Missed scheduled outpatient dialysis 1/31. K 6.9. Hyperkalemia seems to be a chronic issue for him. Agree with shifting agents, lokelma, cal gluc. Plan for HD first thing in AM if possible. If not to be admitted, can be discharged after HD in AM and can resume his MWF HD schedule thereafter. Orders placed.  Outpatient orders: MWF @ [facility] 4h 12min 400/800 67kg TDC Hep 5000 + 5000 midrun

## 2020-10-29 NOTE — ED Notes (Signed)
Ptar called by Kwasi Joung pt is number 8 on the list

## 2020-10-30 DIAGNOSIS — I10 Essential (primary) hypertension: Secondary | ICD-10-CM | POA: Diagnosis not present

## 2020-10-31 ENCOUNTER — Encounter (HOSPITAL_COMMUNITY): Payer: Medicare HMO

## 2020-10-31 DIAGNOSIS — Z743 Need for continuous supervision: Secondary | ICD-10-CM | POA: Diagnosis not present

## 2020-10-31 DIAGNOSIS — D688 Other specified coagulation defects: Secondary | ICD-10-CM | POA: Diagnosis not present

## 2020-10-31 DIAGNOSIS — D509 Iron deficiency anemia, unspecified: Secondary | ICD-10-CM | POA: Diagnosis not present

## 2020-10-31 DIAGNOSIS — R5381 Other malaise: Secondary | ICD-10-CM | POA: Diagnosis not present

## 2020-10-31 DIAGNOSIS — E11621 Type 2 diabetes mellitus with foot ulcer: Secondary | ICD-10-CM | POA: Diagnosis not present

## 2020-10-31 DIAGNOSIS — T8249XA Other complication of vascular dialysis catheter, initial encounter: Secondary | ICD-10-CM | POA: Diagnosis not present

## 2020-10-31 DIAGNOSIS — I10 Essential (primary) hypertension: Secondary | ICD-10-CM | POA: Diagnosis not present

## 2020-10-31 DIAGNOSIS — N2581 Secondary hyperparathyroidism of renal origin: Secondary | ICD-10-CM | POA: Diagnosis not present

## 2020-10-31 DIAGNOSIS — E875 Hyperkalemia: Secondary | ICD-10-CM | POA: Diagnosis not present

## 2020-10-31 DIAGNOSIS — Z7401 Bed confinement status: Secondary | ICD-10-CM | POA: Diagnosis not present

## 2020-10-31 DIAGNOSIS — N186 End stage renal disease: Secondary | ICD-10-CM | POA: Diagnosis not present

## 2020-10-31 DIAGNOSIS — Z992 Dependence on renal dialysis: Secondary | ICD-10-CM | POA: Diagnosis not present

## 2020-10-31 DIAGNOSIS — U071 COVID-19: Secondary | ICD-10-CM | POA: Diagnosis not present

## 2020-10-31 DIAGNOSIS — D631 Anemia in chronic kidney disease: Secondary | ICD-10-CM | POA: Diagnosis not present

## 2020-10-31 DIAGNOSIS — M2550 Pain in unspecified joint: Secondary | ICD-10-CM | POA: Diagnosis not present

## 2020-11-01 DIAGNOSIS — I10 Essential (primary) hypertension: Secondary | ICD-10-CM | POA: Diagnosis not present

## 2020-11-02 DIAGNOSIS — Z992 Dependence on renal dialysis: Secondary | ICD-10-CM | POA: Diagnosis not present

## 2020-11-02 DIAGNOSIS — R5381 Other malaise: Secondary | ICD-10-CM | POA: Diagnosis not present

## 2020-11-02 DIAGNOSIS — Z7401 Bed confinement status: Secondary | ICD-10-CM | POA: Diagnosis not present

## 2020-11-02 DIAGNOSIS — E11621 Type 2 diabetes mellitus with foot ulcer: Secondary | ICD-10-CM | POA: Diagnosis not present

## 2020-11-02 DIAGNOSIS — N186 End stage renal disease: Secondary | ICD-10-CM | POA: Diagnosis not present

## 2020-11-02 DIAGNOSIS — D631 Anemia in chronic kidney disease: Secondary | ICD-10-CM | POA: Diagnosis not present

## 2020-11-02 DIAGNOSIS — R279 Unspecified lack of coordination: Secondary | ICD-10-CM | POA: Diagnosis not present

## 2020-11-02 DIAGNOSIS — D509 Iron deficiency anemia, unspecified: Secondary | ICD-10-CM | POA: Diagnosis not present

## 2020-11-02 DIAGNOSIS — M2550 Pain in unspecified joint: Secondary | ICD-10-CM | POA: Diagnosis not present

## 2020-11-02 DIAGNOSIS — N2581 Secondary hyperparathyroidism of renal origin: Secondary | ICD-10-CM | POA: Diagnosis not present

## 2020-11-02 DIAGNOSIS — D688 Other specified coagulation defects: Secondary | ICD-10-CM | POA: Diagnosis not present

## 2020-11-02 DIAGNOSIS — E875 Hyperkalemia: Secondary | ICD-10-CM | POA: Diagnosis not present

## 2020-11-02 DIAGNOSIS — T8249XA Other complication of vascular dialysis catheter, initial encounter: Secondary | ICD-10-CM | POA: Diagnosis not present

## 2020-11-02 DIAGNOSIS — Z743 Need for continuous supervision: Secondary | ICD-10-CM | POA: Diagnosis not present

## 2020-11-02 DIAGNOSIS — I10 Essential (primary) hypertension: Secondary | ICD-10-CM | POA: Diagnosis not present

## 2020-11-03 DIAGNOSIS — I10 Essential (primary) hypertension: Secondary | ICD-10-CM | POA: Diagnosis not present

## 2020-11-04 DIAGNOSIS — I10 Essential (primary) hypertension: Secondary | ICD-10-CM | POA: Diagnosis not present

## 2020-11-05 DIAGNOSIS — Z7401 Bed confinement status: Secondary | ICD-10-CM | POA: Diagnosis not present

## 2020-11-05 DIAGNOSIS — E875 Hyperkalemia: Secondary | ICD-10-CM | POA: Diagnosis not present

## 2020-11-05 DIAGNOSIS — T8249XA Other complication of vascular dialysis catheter, initial encounter: Secondary | ICD-10-CM | POA: Diagnosis not present

## 2020-11-05 DIAGNOSIS — D509 Iron deficiency anemia, unspecified: Secondary | ICD-10-CM | POA: Diagnosis not present

## 2020-11-05 DIAGNOSIS — Z992 Dependence on renal dialysis: Secondary | ICD-10-CM | POA: Diagnosis not present

## 2020-11-05 DIAGNOSIS — U071 COVID-19: Secondary | ICD-10-CM | POA: Diagnosis not present

## 2020-11-05 DIAGNOSIS — E11621 Type 2 diabetes mellitus with foot ulcer: Secondary | ICD-10-CM | POA: Diagnosis not present

## 2020-11-05 DIAGNOSIS — R5381 Other malaise: Secondary | ICD-10-CM | POA: Diagnosis not present

## 2020-11-05 DIAGNOSIS — Z743 Need for continuous supervision: Secondary | ICD-10-CM | POA: Diagnosis not present

## 2020-11-05 DIAGNOSIS — I10 Essential (primary) hypertension: Secondary | ICD-10-CM | POA: Diagnosis not present

## 2020-11-05 DIAGNOSIS — N186 End stage renal disease: Secondary | ICD-10-CM | POA: Diagnosis not present

## 2020-11-05 DIAGNOSIS — D688 Other specified coagulation defects: Secondary | ICD-10-CM | POA: Diagnosis not present

## 2020-11-05 DIAGNOSIS — M2550 Pain in unspecified joint: Secondary | ICD-10-CM | POA: Diagnosis not present

## 2020-11-05 DIAGNOSIS — D631 Anemia in chronic kidney disease: Secondary | ICD-10-CM | POA: Diagnosis not present

## 2020-11-05 DIAGNOSIS — N2581 Secondary hyperparathyroidism of renal origin: Secondary | ICD-10-CM | POA: Diagnosis not present

## 2020-11-06 DIAGNOSIS — I10 Essential (primary) hypertension: Secondary | ICD-10-CM | POA: Diagnosis not present

## 2020-11-06 LAB — ACID FAST CULTURE WITH REFLEXED SENSITIVITIES (MYCOBACTERIA): Acid Fast Culture: NEGATIVE

## 2020-11-07 DIAGNOSIS — Z7401 Bed confinement status: Secondary | ICD-10-CM | POA: Diagnosis not present

## 2020-11-07 DIAGNOSIS — E11621 Type 2 diabetes mellitus with foot ulcer: Secondary | ICD-10-CM | POA: Diagnosis not present

## 2020-11-07 DIAGNOSIS — U071 COVID-19: Secondary | ICD-10-CM | POA: Diagnosis not present

## 2020-11-07 DIAGNOSIS — D688 Other specified coagulation defects: Secondary | ICD-10-CM | POA: Diagnosis not present

## 2020-11-07 DIAGNOSIS — R5381 Other malaise: Secondary | ICD-10-CM | POA: Diagnosis not present

## 2020-11-07 DIAGNOSIS — D509 Iron deficiency anemia, unspecified: Secondary | ICD-10-CM | POA: Diagnosis not present

## 2020-11-07 DIAGNOSIS — T8249XA Other complication of vascular dialysis catheter, initial encounter: Secondary | ICD-10-CM | POA: Diagnosis not present

## 2020-11-07 DIAGNOSIS — Z992 Dependence on renal dialysis: Secondary | ICD-10-CM | POA: Diagnosis not present

## 2020-11-07 DIAGNOSIS — N2581 Secondary hyperparathyroidism of renal origin: Secondary | ICD-10-CM | POA: Diagnosis not present

## 2020-11-07 DIAGNOSIS — E875 Hyperkalemia: Secondary | ICD-10-CM | POA: Diagnosis not present

## 2020-11-07 DIAGNOSIS — I10 Essential (primary) hypertension: Secondary | ICD-10-CM | POA: Diagnosis not present

## 2020-11-07 DIAGNOSIS — M2550 Pain in unspecified joint: Secondary | ICD-10-CM | POA: Diagnosis not present

## 2020-11-07 DIAGNOSIS — N186 End stage renal disease: Secondary | ICD-10-CM | POA: Diagnosis not present

## 2020-11-07 DIAGNOSIS — D631 Anemia in chronic kidney disease: Secondary | ICD-10-CM | POA: Diagnosis not present

## 2020-11-07 DIAGNOSIS — Z743 Need for continuous supervision: Secondary | ICD-10-CM | POA: Diagnosis not present

## 2020-11-08 DIAGNOSIS — I10 Essential (primary) hypertension: Secondary | ICD-10-CM | POA: Diagnosis not present

## 2020-11-09 DIAGNOSIS — D631 Anemia in chronic kidney disease: Secondary | ICD-10-CM | POA: Diagnosis not present

## 2020-11-09 DIAGNOSIS — R5381 Other malaise: Secondary | ICD-10-CM | POA: Diagnosis not present

## 2020-11-09 DIAGNOSIS — M2550 Pain in unspecified joint: Secondary | ICD-10-CM | POA: Diagnosis not present

## 2020-11-09 DIAGNOSIS — Z992 Dependence on renal dialysis: Secondary | ICD-10-CM | POA: Diagnosis not present

## 2020-11-09 DIAGNOSIS — T8249XA Other complication of vascular dialysis catheter, initial encounter: Secondary | ICD-10-CM | POA: Diagnosis not present

## 2020-11-09 DIAGNOSIS — N2581 Secondary hyperparathyroidism of renal origin: Secondary | ICD-10-CM | POA: Diagnosis not present

## 2020-11-09 DIAGNOSIS — Z743 Need for continuous supervision: Secondary | ICD-10-CM | POA: Diagnosis not present

## 2020-11-09 DIAGNOSIS — I10 Essential (primary) hypertension: Secondary | ICD-10-CM | POA: Diagnosis not present

## 2020-11-09 DIAGNOSIS — D688 Other specified coagulation defects: Secondary | ICD-10-CM | POA: Diagnosis not present

## 2020-11-09 DIAGNOSIS — N186 End stage renal disease: Secondary | ICD-10-CM | POA: Diagnosis not present

## 2020-11-09 DIAGNOSIS — D509 Iron deficiency anemia, unspecified: Secondary | ICD-10-CM | POA: Diagnosis not present

## 2020-11-09 DIAGNOSIS — E875 Hyperkalemia: Secondary | ICD-10-CM | POA: Diagnosis not present

## 2020-11-09 DIAGNOSIS — Z7401 Bed confinement status: Secondary | ICD-10-CM | POA: Diagnosis not present

## 2020-11-09 DIAGNOSIS — E11621 Type 2 diabetes mellitus with foot ulcer: Secondary | ICD-10-CM | POA: Diagnosis not present

## 2020-11-10 DIAGNOSIS — I10 Essential (primary) hypertension: Secondary | ICD-10-CM | POA: Diagnosis not present

## 2020-11-11 DIAGNOSIS — I10 Essential (primary) hypertension: Secondary | ICD-10-CM | POA: Diagnosis not present

## 2020-11-12 DIAGNOSIS — Z743 Need for continuous supervision: Secondary | ICD-10-CM | POA: Diagnosis not present

## 2020-11-12 DIAGNOSIS — E11621 Type 2 diabetes mellitus with foot ulcer: Secondary | ICD-10-CM | POA: Diagnosis not present

## 2020-11-12 DIAGNOSIS — N2581 Secondary hyperparathyroidism of renal origin: Secondary | ICD-10-CM | POA: Diagnosis not present

## 2020-11-12 DIAGNOSIS — Z7401 Bed confinement status: Secondary | ICD-10-CM | POA: Diagnosis not present

## 2020-11-12 DIAGNOSIS — N186 End stage renal disease: Secondary | ICD-10-CM | POA: Diagnosis not present

## 2020-11-12 DIAGNOSIS — Z992 Dependence on renal dialysis: Secondary | ICD-10-CM | POA: Diagnosis not present

## 2020-11-12 DIAGNOSIS — D509 Iron deficiency anemia, unspecified: Secondary | ICD-10-CM | POA: Diagnosis not present

## 2020-11-12 DIAGNOSIS — E875 Hyperkalemia: Secondary | ICD-10-CM | POA: Diagnosis not present

## 2020-11-12 DIAGNOSIS — D688 Other specified coagulation defects: Secondary | ICD-10-CM | POA: Diagnosis not present

## 2020-11-12 DIAGNOSIS — I10 Essential (primary) hypertension: Secondary | ICD-10-CM | POA: Diagnosis not present

## 2020-11-12 DIAGNOSIS — D631 Anemia in chronic kidney disease: Secondary | ICD-10-CM | POA: Diagnosis not present

## 2020-11-12 DIAGNOSIS — I959 Hypotension, unspecified: Secondary | ICD-10-CM | POA: Diagnosis not present

## 2020-11-12 DIAGNOSIS — M2550 Pain in unspecified joint: Secondary | ICD-10-CM | POA: Diagnosis not present

## 2020-11-12 DIAGNOSIS — T8249XA Other complication of vascular dialysis catheter, initial encounter: Secondary | ICD-10-CM | POA: Diagnosis not present

## 2020-11-12 DIAGNOSIS — U071 COVID-19: Secondary | ICD-10-CM | POA: Diagnosis not present

## 2020-11-13 ENCOUNTER — Telehealth (INDEPENDENT_AMBULATORY_CARE_PROVIDER_SITE_OTHER): Payer: Medicare HMO | Admitting: Primary Care

## 2020-11-13 DIAGNOSIS — I10 Essential (primary) hypertension: Secondary | ICD-10-CM | POA: Diagnosis not present

## 2020-11-14 DIAGNOSIS — I10 Essential (primary) hypertension: Secondary | ICD-10-CM | POA: Diagnosis not present

## 2020-11-15 DIAGNOSIS — Z992 Dependence on renal dialysis: Secondary | ICD-10-CM | POA: Diagnosis not present

## 2020-11-15 DIAGNOSIS — E11621 Type 2 diabetes mellitus with foot ulcer: Secondary | ICD-10-CM | POA: Diagnosis not present

## 2020-11-15 DIAGNOSIS — Z7401 Bed confinement status: Secondary | ICD-10-CM | POA: Diagnosis not present

## 2020-11-15 DIAGNOSIS — D631 Anemia in chronic kidney disease: Secondary | ICD-10-CM | POA: Diagnosis not present

## 2020-11-15 DIAGNOSIS — R5381 Other malaise: Secondary | ICD-10-CM | POA: Diagnosis not present

## 2020-11-15 DIAGNOSIS — T8249XA Other complication of vascular dialysis catheter, initial encounter: Secondary | ICD-10-CM | POA: Diagnosis not present

## 2020-11-15 DIAGNOSIS — I10 Essential (primary) hypertension: Secondary | ICD-10-CM | POA: Diagnosis not present

## 2020-11-15 DIAGNOSIS — D509 Iron deficiency anemia, unspecified: Secondary | ICD-10-CM | POA: Diagnosis not present

## 2020-11-15 DIAGNOSIS — M2550 Pain in unspecified joint: Secondary | ICD-10-CM | POA: Diagnosis not present

## 2020-11-15 DIAGNOSIS — R279 Unspecified lack of coordination: Secondary | ICD-10-CM | POA: Diagnosis not present

## 2020-11-15 DIAGNOSIS — E875 Hyperkalemia: Secondary | ICD-10-CM | POA: Diagnosis not present

## 2020-11-15 DIAGNOSIS — Z743 Need for continuous supervision: Secondary | ICD-10-CM | POA: Diagnosis not present

## 2020-11-15 DIAGNOSIS — D688 Other specified coagulation defects: Secondary | ICD-10-CM | POA: Diagnosis not present

## 2020-11-15 DIAGNOSIS — N186 End stage renal disease: Secondary | ICD-10-CM | POA: Diagnosis not present

## 2020-11-15 DIAGNOSIS — N2581 Secondary hyperparathyroidism of renal origin: Secondary | ICD-10-CM | POA: Diagnosis not present

## 2020-11-16 DIAGNOSIS — I10 Essential (primary) hypertension: Secondary | ICD-10-CM | POA: Diagnosis not present

## 2020-11-17 DIAGNOSIS — I10 Essential (primary) hypertension: Secondary | ICD-10-CM | POA: Diagnosis not present

## 2020-11-18 DIAGNOSIS — N186 End stage renal disease: Secondary | ICD-10-CM | POA: Diagnosis not present

## 2020-11-18 DIAGNOSIS — N2581 Secondary hyperparathyroidism of renal origin: Secondary | ICD-10-CM | POA: Diagnosis not present

## 2020-11-18 DIAGNOSIS — Z743 Need for continuous supervision: Secondary | ICD-10-CM | POA: Diagnosis not present

## 2020-11-18 DIAGNOSIS — D509 Iron deficiency anemia, unspecified: Secondary | ICD-10-CM | POA: Diagnosis not present

## 2020-11-18 DIAGNOSIS — I10 Essential (primary) hypertension: Secondary | ICD-10-CM | POA: Diagnosis not present

## 2020-11-18 DIAGNOSIS — R279 Unspecified lack of coordination: Secondary | ICD-10-CM | POA: Diagnosis not present

## 2020-11-18 DIAGNOSIS — T8249XA Other complication of vascular dialysis catheter, initial encounter: Secondary | ICD-10-CM | POA: Diagnosis not present

## 2020-11-18 DIAGNOSIS — D631 Anemia in chronic kidney disease: Secondary | ICD-10-CM | POA: Diagnosis not present

## 2020-11-18 DIAGNOSIS — E11621 Type 2 diabetes mellitus with foot ulcer: Secondary | ICD-10-CM | POA: Diagnosis not present

## 2020-11-18 DIAGNOSIS — D688 Other specified coagulation defects: Secondary | ICD-10-CM | POA: Diagnosis not present

## 2020-11-18 DIAGNOSIS — Z992 Dependence on renal dialysis: Secondary | ICD-10-CM | POA: Diagnosis not present

## 2020-11-18 DIAGNOSIS — E875 Hyperkalemia: Secondary | ICD-10-CM | POA: Diagnosis not present

## 2020-11-19 DIAGNOSIS — I10 Essential (primary) hypertension: Secondary | ICD-10-CM | POA: Diagnosis not present

## 2020-11-20 DIAGNOSIS — D688 Other specified coagulation defects: Secondary | ICD-10-CM | POA: Diagnosis not present

## 2020-11-20 DIAGNOSIS — R5381 Other malaise: Secondary | ICD-10-CM | POA: Diagnosis not present

## 2020-11-20 DIAGNOSIS — M2550 Pain in unspecified joint: Secondary | ICD-10-CM | POA: Diagnosis not present

## 2020-11-20 DIAGNOSIS — T8249XA Other complication of vascular dialysis catheter, initial encounter: Secondary | ICD-10-CM | POA: Diagnosis not present

## 2020-11-20 DIAGNOSIS — E875 Hyperkalemia: Secondary | ICD-10-CM | POA: Diagnosis not present

## 2020-11-20 DIAGNOSIS — I10 Essential (primary) hypertension: Secondary | ICD-10-CM | POA: Diagnosis not present

## 2020-11-20 DIAGNOSIS — D631 Anemia in chronic kidney disease: Secondary | ICD-10-CM | POA: Diagnosis not present

## 2020-11-20 DIAGNOSIS — E11621 Type 2 diabetes mellitus with foot ulcer: Secondary | ICD-10-CM | POA: Diagnosis not present

## 2020-11-20 DIAGNOSIS — Z743 Need for continuous supervision: Secondary | ICD-10-CM | POA: Diagnosis not present

## 2020-11-20 DIAGNOSIS — Z992 Dependence on renal dialysis: Secondary | ICD-10-CM | POA: Diagnosis not present

## 2020-11-20 DIAGNOSIS — D509 Iron deficiency anemia, unspecified: Secondary | ICD-10-CM | POA: Diagnosis not present

## 2020-11-20 DIAGNOSIS — Z7401 Bed confinement status: Secondary | ICD-10-CM | POA: Diagnosis not present

## 2020-11-20 DIAGNOSIS — N2581 Secondary hyperparathyroidism of renal origin: Secondary | ICD-10-CM | POA: Diagnosis not present

## 2020-11-20 DIAGNOSIS — N186 End stage renal disease: Secondary | ICD-10-CM | POA: Diagnosis not present

## 2020-11-21 DIAGNOSIS — I10 Essential (primary) hypertension: Secondary | ICD-10-CM | POA: Diagnosis not present

## 2020-11-22 DIAGNOSIS — Z7401 Bed confinement status: Secondary | ICD-10-CM | POA: Diagnosis not present

## 2020-11-22 DIAGNOSIS — D631 Anemia in chronic kidney disease: Secondary | ICD-10-CM | POA: Diagnosis not present

## 2020-11-22 DIAGNOSIS — E875 Hyperkalemia: Secondary | ICD-10-CM | POA: Diagnosis not present

## 2020-11-22 DIAGNOSIS — T8249XA Other complication of vascular dialysis catheter, initial encounter: Secondary | ICD-10-CM | POA: Diagnosis not present

## 2020-11-22 DIAGNOSIS — Z743 Need for continuous supervision: Secondary | ICD-10-CM | POA: Diagnosis not present

## 2020-11-22 DIAGNOSIS — D509 Iron deficiency anemia, unspecified: Secondary | ICD-10-CM | POA: Diagnosis not present

## 2020-11-22 DIAGNOSIS — M2550 Pain in unspecified joint: Secondary | ICD-10-CM | POA: Diagnosis not present

## 2020-11-22 DIAGNOSIS — R5381 Other malaise: Secondary | ICD-10-CM | POA: Diagnosis not present

## 2020-11-22 DIAGNOSIS — E11621 Type 2 diabetes mellitus with foot ulcer: Secondary | ICD-10-CM | POA: Diagnosis not present

## 2020-11-22 DIAGNOSIS — I10 Essential (primary) hypertension: Secondary | ICD-10-CM | POA: Diagnosis not present

## 2020-11-22 DIAGNOSIS — N186 End stage renal disease: Secondary | ICD-10-CM | POA: Diagnosis not present

## 2020-11-22 DIAGNOSIS — D688 Other specified coagulation defects: Secondary | ICD-10-CM | POA: Diagnosis not present

## 2020-11-22 DIAGNOSIS — N2581 Secondary hyperparathyroidism of renal origin: Secondary | ICD-10-CM | POA: Diagnosis not present

## 2020-11-22 DIAGNOSIS — Z992 Dependence on renal dialysis: Secondary | ICD-10-CM | POA: Diagnosis not present

## 2020-11-23 DIAGNOSIS — I10 Essential (primary) hypertension: Secondary | ICD-10-CM | POA: Diagnosis not present

## 2020-11-24 DIAGNOSIS — I10 Essential (primary) hypertension: Secondary | ICD-10-CM | POA: Diagnosis not present

## 2020-11-25 DIAGNOSIS — R5381 Other malaise: Secondary | ICD-10-CM | POA: Diagnosis not present

## 2020-11-25 DIAGNOSIS — Z7401 Bed confinement status: Secondary | ICD-10-CM | POA: Diagnosis not present

## 2020-11-25 DIAGNOSIS — Z743 Need for continuous supervision: Secondary | ICD-10-CM | POA: Diagnosis not present

## 2020-11-25 DIAGNOSIS — E11621 Type 2 diabetes mellitus with foot ulcer: Secondary | ICD-10-CM | POA: Diagnosis not present

## 2020-11-25 DIAGNOSIS — D688 Other specified coagulation defects: Secondary | ICD-10-CM | POA: Diagnosis not present

## 2020-11-25 DIAGNOSIS — D509 Iron deficiency anemia, unspecified: Secondary | ICD-10-CM | POA: Diagnosis not present

## 2020-11-25 DIAGNOSIS — E875 Hyperkalemia: Secondary | ICD-10-CM | POA: Diagnosis not present

## 2020-11-25 DIAGNOSIS — I10 Essential (primary) hypertension: Secondary | ICD-10-CM | POA: Diagnosis not present

## 2020-11-25 DIAGNOSIS — D631 Anemia in chronic kidney disease: Secondary | ICD-10-CM | POA: Diagnosis not present

## 2020-11-25 DIAGNOSIS — S0990XA Unspecified injury of head, initial encounter: Secondary | ICD-10-CM | POA: Diagnosis not present

## 2020-11-25 DIAGNOSIS — Z992 Dependence on renal dialysis: Secondary | ICD-10-CM | POA: Diagnosis not present

## 2020-11-25 DIAGNOSIS — T8249XA Other complication of vascular dialysis catheter, initial encounter: Secondary | ICD-10-CM | POA: Diagnosis not present

## 2020-11-25 DIAGNOSIS — N186 End stage renal disease: Secondary | ICD-10-CM | POA: Diagnosis not present

## 2020-11-25 DIAGNOSIS — M2550 Pain in unspecified joint: Secondary | ICD-10-CM | POA: Diagnosis not present

## 2020-11-25 DIAGNOSIS — R279 Unspecified lack of coordination: Secondary | ICD-10-CM | POA: Diagnosis not present

## 2020-11-25 DIAGNOSIS — I129 Hypertensive chronic kidney disease with stage 1 through stage 4 chronic kidney disease, or unspecified chronic kidney disease: Secondary | ICD-10-CM | POA: Diagnosis not present

## 2020-11-25 DIAGNOSIS — W19XXXA Unspecified fall, initial encounter: Secondary | ICD-10-CM | POA: Diagnosis not present

## 2020-11-25 DIAGNOSIS — N2581 Secondary hyperparathyroidism of renal origin: Secondary | ICD-10-CM | POA: Diagnosis not present

## 2020-11-26 DIAGNOSIS — S91302A Unspecified open wound, left foot, initial encounter: Secondary | ICD-10-CM | POA: Diagnosis not present

## 2020-11-26 DIAGNOSIS — Z4781 Encounter for orthopedic aftercare following surgical amputation: Secondary | ICD-10-CM | POA: Diagnosis not present

## 2020-11-26 DIAGNOSIS — I10 Essential (primary) hypertension: Secondary | ICD-10-CM | POA: Diagnosis not present

## 2020-11-27 DIAGNOSIS — D631 Anemia in chronic kidney disease: Secondary | ICD-10-CM | POA: Diagnosis not present

## 2020-11-27 DIAGNOSIS — I10 Essential (primary) hypertension: Secondary | ICD-10-CM | POA: Diagnosis not present

## 2020-11-27 DIAGNOSIS — D688 Other specified coagulation defects: Secondary | ICD-10-CM | POA: Diagnosis not present

## 2020-11-27 DIAGNOSIS — T8249XA Other complication of vascular dialysis catheter, initial encounter: Secondary | ICD-10-CM | POA: Diagnosis not present

## 2020-11-27 DIAGNOSIS — Z743 Need for continuous supervision: Secondary | ICD-10-CM | POA: Diagnosis not present

## 2020-11-27 DIAGNOSIS — N2581 Secondary hyperparathyroidism of renal origin: Secondary | ICD-10-CM | POA: Diagnosis not present

## 2020-11-27 DIAGNOSIS — Z7401 Bed confinement status: Secondary | ICD-10-CM | POA: Diagnosis not present

## 2020-11-27 DIAGNOSIS — N186 End stage renal disease: Secondary | ICD-10-CM | POA: Diagnosis not present

## 2020-11-27 DIAGNOSIS — R5381 Other malaise: Secondary | ICD-10-CM | POA: Diagnosis not present

## 2020-11-27 DIAGNOSIS — Z992 Dependence on renal dialysis: Secondary | ICD-10-CM | POA: Diagnosis not present

## 2020-11-27 DIAGNOSIS — M2550 Pain in unspecified joint: Secondary | ICD-10-CM | POA: Diagnosis not present

## 2020-11-28 DIAGNOSIS — I10 Essential (primary) hypertension: Secondary | ICD-10-CM | POA: Diagnosis not present

## 2020-11-29 DIAGNOSIS — I10 Essential (primary) hypertension: Secondary | ICD-10-CM | POA: Diagnosis not present

## 2020-11-29 DIAGNOSIS — Z7401 Bed confinement status: Secondary | ICD-10-CM | POA: Diagnosis not present

## 2020-11-29 DIAGNOSIS — D631 Anemia in chronic kidney disease: Secondary | ICD-10-CM | POA: Diagnosis not present

## 2020-11-29 DIAGNOSIS — Z992 Dependence on renal dialysis: Secondary | ICD-10-CM | POA: Diagnosis not present

## 2020-11-29 DIAGNOSIS — M2550 Pain in unspecified joint: Secondary | ICD-10-CM | POA: Diagnosis not present

## 2020-11-29 DIAGNOSIS — N186 End stage renal disease: Secondary | ICD-10-CM | POA: Diagnosis not present

## 2020-11-29 DIAGNOSIS — R5381 Other malaise: Secondary | ICD-10-CM | POA: Diagnosis not present

## 2020-11-29 DIAGNOSIS — D688 Other specified coagulation defects: Secondary | ICD-10-CM | POA: Diagnosis not present

## 2020-11-29 DIAGNOSIS — T8249XA Other complication of vascular dialysis catheter, initial encounter: Secondary | ICD-10-CM | POA: Diagnosis not present

## 2020-11-29 DIAGNOSIS — Z743 Need for continuous supervision: Secondary | ICD-10-CM | POA: Diagnosis not present

## 2020-11-29 DIAGNOSIS — N2581 Secondary hyperparathyroidism of renal origin: Secondary | ICD-10-CM | POA: Diagnosis not present

## 2020-11-30 DIAGNOSIS — I10 Essential (primary) hypertension: Secondary | ICD-10-CM | POA: Diagnosis not present

## 2020-12-01 DIAGNOSIS — I10 Essential (primary) hypertension: Secondary | ICD-10-CM | POA: Diagnosis not present

## 2020-12-02 DIAGNOSIS — Z992 Dependence on renal dialysis: Secondary | ICD-10-CM | POA: Diagnosis not present

## 2020-12-02 DIAGNOSIS — T8249XA Other complication of vascular dialysis catheter, initial encounter: Secondary | ICD-10-CM | POA: Diagnosis not present

## 2020-12-02 DIAGNOSIS — N2581 Secondary hyperparathyroidism of renal origin: Secondary | ICD-10-CM | POA: Diagnosis not present

## 2020-12-02 DIAGNOSIS — M2550 Pain in unspecified joint: Secondary | ICD-10-CM | POA: Diagnosis not present

## 2020-12-02 DIAGNOSIS — Z7401 Bed confinement status: Secondary | ICD-10-CM | POA: Diagnosis not present

## 2020-12-02 DIAGNOSIS — I10 Essential (primary) hypertension: Secondary | ICD-10-CM | POA: Diagnosis not present

## 2020-12-02 DIAGNOSIS — Z743 Need for continuous supervision: Secondary | ICD-10-CM | POA: Diagnosis not present

## 2020-12-02 DIAGNOSIS — D631 Anemia in chronic kidney disease: Secondary | ICD-10-CM | POA: Diagnosis not present

## 2020-12-02 DIAGNOSIS — D688 Other specified coagulation defects: Secondary | ICD-10-CM | POA: Diagnosis not present

## 2020-12-02 DIAGNOSIS — N186 End stage renal disease: Secondary | ICD-10-CM | POA: Diagnosis not present

## 2020-12-02 DIAGNOSIS — R5381 Other malaise: Secondary | ICD-10-CM | POA: Diagnosis not present

## 2020-12-03 DIAGNOSIS — T8249XA Other complication of vascular dialysis catheter, initial encounter: Secondary | ICD-10-CM | POA: Diagnosis not present

## 2020-12-03 DIAGNOSIS — N2581 Secondary hyperparathyroidism of renal origin: Secondary | ICD-10-CM | POA: Diagnosis not present

## 2020-12-03 DIAGNOSIS — I10 Essential (primary) hypertension: Secondary | ICD-10-CM | POA: Diagnosis not present

## 2020-12-03 DIAGNOSIS — N186 End stage renal disease: Secondary | ICD-10-CM | POA: Diagnosis not present

## 2020-12-03 DIAGNOSIS — D688 Other specified coagulation defects: Secondary | ICD-10-CM | POA: Diagnosis not present

## 2020-12-03 DIAGNOSIS — D631 Anemia in chronic kidney disease: Secondary | ICD-10-CM | POA: Diagnosis not present

## 2020-12-03 DIAGNOSIS — Z992 Dependence on renal dialysis: Secondary | ICD-10-CM | POA: Diagnosis not present

## 2020-12-04 DIAGNOSIS — R5381 Other malaise: Secondary | ICD-10-CM | POA: Diagnosis not present

## 2020-12-04 DIAGNOSIS — Z992 Dependence on renal dialysis: Secondary | ICD-10-CM | POA: Diagnosis not present

## 2020-12-04 DIAGNOSIS — N186 End stage renal disease: Secondary | ICD-10-CM | POA: Diagnosis not present

## 2020-12-04 DIAGNOSIS — Z743 Need for continuous supervision: Secondary | ICD-10-CM | POA: Diagnosis not present

## 2020-12-04 DIAGNOSIS — M2550 Pain in unspecified joint: Secondary | ICD-10-CM | POA: Diagnosis not present

## 2020-12-04 DIAGNOSIS — I10 Essential (primary) hypertension: Secondary | ICD-10-CM | POA: Diagnosis not present

## 2020-12-04 DIAGNOSIS — Z7401 Bed confinement status: Secondary | ICD-10-CM | POA: Diagnosis not present

## 2020-12-04 DIAGNOSIS — N2581 Secondary hyperparathyroidism of renal origin: Secondary | ICD-10-CM | POA: Diagnosis not present

## 2020-12-04 DIAGNOSIS — D688 Other specified coagulation defects: Secondary | ICD-10-CM | POA: Diagnosis not present

## 2020-12-04 DIAGNOSIS — T8249XA Other complication of vascular dialysis catheter, initial encounter: Secondary | ICD-10-CM | POA: Diagnosis not present

## 2020-12-04 DIAGNOSIS — D631 Anemia in chronic kidney disease: Secondary | ICD-10-CM | POA: Diagnosis not present

## 2020-12-05 DIAGNOSIS — I10 Essential (primary) hypertension: Secondary | ICD-10-CM | POA: Diagnosis not present

## 2020-12-06 DIAGNOSIS — I10 Essential (primary) hypertension: Secondary | ICD-10-CM | POA: Diagnosis not present

## 2020-12-06 DIAGNOSIS — D688 Other specified coagulation defects: Secondary | ICD-10-CM | POA: Diagnosis not present

## 2020-12-06 DIAGNOSIS — N186 End stage renal disease: Secondary | ICD-10-CM | POA: Diagnosis not present

## 2020-12-06 DIAGNOSIS — Z7401 Bed confinement status: Secondary | ICD-10-CM | POA: Diagnosis not present

## 2020-12-06 DIAGNOSIS — M2550 Pain in unspecified joint: Secondary | ICD-10-CM | POA: Diagnosis not present

## 2020-12-06 DIAGNOSIS — D631 Anemia in chronic kidney disease: Secondary | ICD-10-CM | POA: Diagnosis not present

## 2020-12-06 DIAGNOSIS — Z992 Dependence on renal dialysis: Secondary | ICD-10-CM | POA: Diagnosis not present

## 2020-12-06 DIAGNOSIS — N2581 Secondary hyperparathyroidism of renal origin: Secondary | ICD-10-CM | POA: Diagnosis not present

## 2020-12-06 DIAGNOSIS — T8249XA Other complication of vascular dialysis catheter, initial encounter: Secondary | ICD-10-CM | POA: Diagnosis not present

## 2020-12-06 DIAGNOSIS — R5381 Other malaise: Secondary | ICD-10-CM | POA: Diagnosis not present

## 2020-12-06 DIAGNOSIS — Z743 Need for continuous supervision: Secondary | ICD-10-CM | POA: Diagnosis not present

## 2020-12-07 DIAGNOSIS — I10 Essential (primary) hypertension: Secondary | ICD-10-CM | POA: Diagnosis not present

## 2020-12-08 DIAGNOSIS — I10 Essential (primary) hypertension: Secondary | ICD-10-CM | POA: Diagnosis not present

## 2020-12-09 DIAGNOSIS — T8249XA Other complication of vascular dialysis catheter, initial encounter: Secondary | ICD-10-CM | POA: Diagnosis not present

## 2020-12-09 DIAGNOSIS — I10 Essential (primary) hypertension: Secondary | ICD-10-CM | POA: Diagnosis not present

## 2020-12-09 DIAGNOSIS — N2581 Secondary hyperparathyroidism of renal origin: Secondary | ICD-10-CM | POA: Diagnosis not present

## 2020-12-09 DIAGNOSIS — R5381 Other malaise: Secondary | ICD-10-CM | POA: Diagnosis not present

## 2020-12-09 DIAGNOSIS — R279 Unspecified lack of coordination: Secondary | ICD-10-CM | POA: Diagnosis not present

## 2020-12-09 DIAGNOSIS — Z743 Need for continuous supervision: Secondary | ICD-10-CM | POA: Diagnosis not present

## 2020-12-09 DIAGNOSIS — R531 Weakness: Secondary | ICD-10-CM | POA: Diagnosis not present

## 2020-12-09 DIAGNOSIS — N186 End stage renal disease: Secondary | ICD-10-CM | POA: Diagnosis not present

## 2020-12-09 DIAGNOSIS — Z992 Dependence on renal dialysis: Secondary | ICD-10-CM | POA: Diagnosis not present

## 2020-12-09 DIAGNOSIS — R0689 Other abnormalities of breathing: Secondary | ICD-10-CM | POA: Diagnosis not present

## 2020-12-09 DIAGNOSIS — D688 Other specified coagulation defects: Secondary | ICD-10-CM | POA: Diagnosis not present

## 2020-12-10 DIAGNOSIS — I10 Essential (primary) hypertension: Secondary | ICD-10-CM | POA: Diagnosis not present

## 2020-12-11 DIAGNOSIS — I10 Essential (primary) hypertension: Secondary | ICD-10-CM | POA: Diagnosis not present

## 2020-12-12 DIAGNOSIS — I10 Essential (primary) hypertension: Secondary | ICD-10-CM | POA: Diagnosis not present

## 2020-12-13 ENCOUNTER — Ambulatory Visit (INDEPENDENT_AMBULATORY_CARE_PROVIDER_SITE_OTHER): Payer: Self-pay | Admitting: *Deleted

## 2020-12-13 DIAGNOSIS — T8249XA Other complication of vascular dialysis catheter, initial encounter: Secondary | ICD-10-CM | POA: Diagnosis not present

## 2020-12-13 DIAGNOSIS — Z743 Need for continuous supervision: Secondary | ICD-10-CM | POA: Diagnosis not present

## 2020-12-13 DIAGNOSIS — R5381 Other malaise: Secondary | ICD-10-CM | POA: Diagnosis not present

## 2020-12-13 DIAGNOSIS — N2581 Secondary hyperparathyroidism of renal origin: Secondary | ICD-10-CM | POA: Diagnosis not present

## 2020-12-13 DIAGNOSIS — I10 Essential (primary) hypertension: Secondary | ICD-10-CM | POA: Diagnosis not present

## 2020-12-13 DIAGNOSIS — Z992 Dependence on renal dialysis: Secondary | ICD-10-CM | POA: Diagnosis not present

## 2020-12-13 DIAGNOSIS — N186 End stage renal disease: Secondary | ICD-10-CM | POA: Diagnosis not present

## 2020-12-13 DIAGNOSIS — D688 Other specified coagulation defects: Secondary | ICD-10-CM | POA: Diagnosis not present

## 2020-12-13 DIAGNOSIS — M2550 Pain in unspecified joint: Secondary | ICD-10-CM | POA: Diagnosis not present

## 2020-12-13 DIAGNOSIS — Z7401 Bed confinement status: Secondary | ICD-10-CM | POA: Diagnosis not present

## 2020-12-13 NOTE — Telephone Encounter (Signed)
Pt's wife called in.   Pt had an AKA above knee amputation in Jan. 2022.  His wife on changing the drsg today said it is looking angry.   It's a grayish color with redness.   The staples are intact.   No pus.   Does not have a thermometer so unsure about fever.  He is also having abd pain and bad diarrhea.   She is wanting him to be seen in the office instead of the ED.  He goes to dialysis and the transportation arrived while I was on the phone with wife.   I let her know he needed to go to the ED.   He will be on dialysis for 4 hours plus with all he has going on he really needs to go to the ED.    Wife thanked me for my help and "I will try to convince him to go after dialysis".      Wife mentioned he is refusing to go to the ED because he has had 2 bad experiences.    He fell the first visit trying to go to the bathroom and damaged his stump.   He ended up having surgery again and that's when it became an above the knee amputation is from that fall.   He originally had a below the knee amputation. He had another fall experience in the ED soon after that.   That's why he is refusing the ED.     Wife had to go assist getting him out the door to the [facility].  I have sent this information to Juluis Mire, NP at [facility].   Reason for Disposition . Patient sounds very sick or weak to the triager  Answer Assessment - Initial Assessment Questions 1. LOCATION: "Where is the wound located?"      Left leg amputated Jan. 20. 2022. Above the knee 2. WOUND APPEARANCE: "What does the wound look like?"      Staples still intact. The wound on his stump is opening up.   Color is changing like a grayish color.   It's still bleeding.   No pain in leg.  He has gastroparesis.   He is having diarrhea and abd pain too.   Some vomiting but mostly diarrhea.   He is taking in very little fluids.   He is immobile.   He goes to dialysis.    He is supposed to go to dialysis in a few minutes.  He  was in the ED 17 hrs before they could see him.  He fell and busted open his stump.   He uses a walker.    He has fallen again trying to get into the bathroom.   The last fall was end of Jan. 2022.   3. SIZE: If redness is present, ask: "What is the size of the red area?" (Inches, centimeters, or compare to size of a coin)      The stump is red.   No pus. 4. SPREAD: "What's changed in the last day?"  "Do you see any red streaks coming from the wound?"     I changed the drsg, wife, yesterday but it looks more angry today.   5. ONSET: "When did it start to look infected?"      Yesterday.   6. MECHANISM: "How did the wound start, what was the cause?"     He had his leg amputated in Jan. 2022 7. PAIN: "Is there any pain?" If Yes,  ask: "How bad is the pain?"   (Scale 1-10; or mild, moderate, severe)     No c/o pain 8. FEVER: "Do you have a fever?" If Yes, ask: "What is your temperature, how was it measured, and when did it start?"     Not sure but does not think so 9. OTHER SYMPTOMS: "Do you have any other symptoms?" (e.g., shaking chills, weakness, rash elsewhere on body)     He is having abd pain and diarrhea 10. PREGNANCY: "Is there any chance you are pregnant?" "When was your last menstrual period?"       N/A  Protocols used: WOUND INFECTION-A-AH

## 2020-12-14 ENCOUNTER — Emergency Department (HOSPITAL_COMMUNITY)
Admission: EM | Admit: 2020-12-14 | Discharge: 2020-12-14 | Disposition: A | Discharge status: Home / Self Care | Payer: Medicare HMO | Attending: Emergency Medicine | Admitting: Emergency Medicine

## 2020-12-14 ENCOUNTER — Encounter (HOSPITAL_COMMUNITY): Payer: Self-pay | Admitting: Emergency Medicine

## 2020-12-14 ENCOUNTER — Other Ambulatory Visit: Payer: Self-pay

## 2020-12-14 DIAGNOSIS — N186 End stage renal disease: Secondary | ICD-10-CM | POA: Insufficient documentation

## 2020-12-14 DIAGNOSIS — I10 Essential (primary) hypertension: Secondary | ICD-10-CM | POA: Diagnosis not present

## 2020-12-14 DIAGNOSIS — R531 Weakness: Secondary | ICD-10-CM | POA: Diagnosis not present

## 2020-12-14 DIAGNOSIS — R52 Pain, unspecified: Secondary | ICD-10-CM | POA: Diagnosis not present

## 2020-12-14 DIAGNOSIS — R197 Diarrhea, unspecified: Secondary | ICD-10-CM | POA: Diagnosis not present

## 2020-12-14 DIAGNOSIS — R5383 Other fatigue: Secondary | ICD-10-CM | POA: Insufficient documentation

## 2020-12-14 DIAGNOSIS — Z743 Need for continuous supervision: Secondary | ICD-10-CM | POA: Diagnosis not present

## 2020-12-14 DIAGNOSIS — I120 Hypertensive chronic kidney disease with stage 5 chronic kidney disease or end stage renal disease: Secondary | ICD-10-CM | POA: Insufficient documentation

## 2020-12-14 DIAGNOSIS — R5381 Other malaise: Secondary | ICD-10-CM | POA: Diagnosis not present

## 2020-12-14 DIAGNOSIS — Z992 Dependence on renal dialysis: Secondary | ICD-10-CM | POA: Diagnosis not present

## 2020-12-14 DIAGNOSIS — Z794 Long term (current) use of insulin: Secondary | ICD-10-CM | POA: Diagnosis not present

## 2020-12-14 DIAGNOSIS — Z8616 Personal history of COVID-19: Secondary | ICD-10-CM | POA: Diagnosis not present

## 2020-12-14 DIAGNOSIS — R1084 Generalized abdominal pain: Secondary | ICD-10-CM | POA: Diagnosis not present

## 2020-12-14 DIAGNOSIS — E1122 Type 2 diabetes mellitus with diabetic chronic kidney disease: Secondary | ICD-10-CM | POA: Diagnosis not present

## 2020-12-14 DIAGNOSIS — I499 Cardiac arrhythmia, unspecified: Secondary | ICD-10-CM | POA: Diagnosis not present

## 2020-12-14 DIAGNOSIS — R279 Unspecified lack of coordination: Secondary | ICD-10-CM | POA: Diagnosis not present

## 2020-12-14 DIAGNOSIS — X58XXXD Exposure to other specified factors, subsequent encounter: Secondary | ICD-10-CM | POA: Diagnosis not present

## 2020-12-14 DIAGNOSIS — S88112D Complete traumatic amputation at level between knee and ankle, left lower leg, subsequent encounter: Secondary | ICD-10-CM | POA: Insufficient documentation

## 2020-12-14 DIAGNOSIS — S88112A Complete traumatic amputation at level between knee and ankle, left lower leg, initial encounter: Secondary | ICD-10-CM | POA: Diagnosis not present

## 2020-12-14 LAB — CBC WITH DIFFERENTIAL/PLATELET
Abs Immature Granulocytes: 0.02 10*3/uL (ref 0.00–0.07)
Basophils Absolute: 0 10*3/uL (ref 0.0–0.1)
Basophils Relative: 1 %
Eosinophils Absolute: 0.2 10*3/uL (ref 0.0–0.5)
Eosinophils Relative: 3 %
HCT: 36.8 % — ABNORMAL LOW (ref 39.0–52.0)
Hemoglobin: 11.5 g/dL — ABNORMAL LOW (ref 13.0–17.0)
Immature Granulocytes: 0 %
Lymphocytes Relative: 20 %
Lymphs Abs: 1.1 10*3/uL (ref 0.7–4.0)
MCH: 27.4 pg (ref 26.0–34.0)
MCHC: 31.3 g/dL (ref 30.0–36.0)
MCV: 87.6 fL (ref 80.0–100.0)
Monocytes Absolute: 0.5 10*3/uL (ref 0.1–1.0)
Monocytes Relative: 9 %
Neutro Abs: 3.7 10*3/uL (ref 1.7–7.7)
Neutrophils Relative %: 67 %
Platelets: 159 10*3/uL (ref 150–400)
RBC: 4.2 MIL/uL — ABNORMAL LOW (ref 4.22–5.81)
RDW: 17.5 % — ABNORMAL HIGH (ref 11.5–15.5)
WBC: 5.5 10*3/uL (ref 4.0–10.5)
nRBC: 0 % (ref 0.0–0.2)

## 2020-12-14 LAB — BASIC METABOLIC PANEL
Anion gap: 14 (ref 5–15)
BUN: 26 mg/dL — ABNORMAL HIGH (ref 6–20)
CO2: 26 mmol/L (ref 22–32)
Calcium: 8.5 mg/dL — ABNORMAL LOW (ref 8.9–10.3)
Chloride: 93 mmol/L — ABNORMAL LOW (ref 98–111)
Creatinine, Ser: 8.66 mg/dL — ABNORMAL HIGH (ref 0.61–1.24)
GFR, Estimated: 7 mL/min — ABNORMAL LOW (ref >60)
Glucose, Bld: 119 mg/dL — ABNORMAL HIGH (ref 70–99)
Potassium: 3.6 mmol/L (ref 3.5–5.1)
Sodium: 133 mmol/L — ABNORMAL LOW (ref 135–145)

## 2020-12-14 MED ORDER — SODIUM CHLORIDE 0.9 % IV BOLUS (SEPSIS)
500.0000 mL | Freq: Once | INTRAVENOUS | Status: AC
  Administered 2020-12-14: 500 mL via INTRAVENOUS

## 2020-12-14 MED ORDER — OXYCODONE-ACETAMINOPHEN 5-325 MG PO TABS: 1.0000 | ORAL_TABLET | ORAL | 0 refills | Status: DC | PRN

## 2020-12-14 MED ORDER — OXYCODONE-ACETAMINOPHEN 5-325 MG PO TABS
1.0000 | ORAL_TABLET | Freq: Once | ORAL | Status: AC
  Administered 2020-12-14: 1 via ORAL
  Filled 2020-12-14: qty 1

## 2020-12-14 NOTE — ED Triage Notes (Signed)
Patient arrived with EMS from home , hemodialysis treatment today , patient stated " they took to much fluid" reports fatigue /generalized weakness. CBG= 169, denies pain / respirations unlabored .

## 2020-12-14 NOTE — ED Provider Notes (Signed)
[facility] EMERGENCY DEPARTMENT Provider Note   CSN: [id number] Arrival date & time: 12/14/20  0159     History Chief Complaint  Patient presents with  . Gen. Weakness/Fatigue    [name] is a 40 y.o. male.  Pt complains of pain atleg amputation site and being weak since dialysis.  Pt reports he thinks he had to much fluid removed.  Pt reports he has also had diarrhea.    The history is provided by the patient. No language interpreter was used.  Weakness Severity:  Mild Onset quality:  Gradual Duration:  1 day Timing:  Constant Progression:  Worsening Chronicity:  New Relieved by:  Nothing Worsened by:  Nothing Ineffective treatments:  None tried Associated symptoms: no numbness in extremities        Past Medical History:  Diagnosis Date  . Anemia   . Diabetes mellitus without complication ([place])   . Diabetic gastroparesis ([place])   . Dialysis patient ([place])   . Hypertension   . Renal disorder    Dialysis  . Sepsis [facility])     Patient Active Problem List   Diagnosis Date Noted  . Severe comorbid illness   . Pressure injury of skin 10/15/2020  . Wound infection 10/14/2020  . Wound dehiscence   . Adjustment disorder, unspecified 09/21/2020  . Malnutrition of moderate degree 09/13/2020  . Osteomyelitis of left foot ([place]) 09/11/2020  . Volume overload 07/22/2020  . Hypocalcemia 06/07/2020  . Diabetic ulcer of heel ([place]) 05/31/2020  . Type 2 diabetes mellitus with foot ulcer ([place]) 04/23/2020  . COVID-19 02/20/2020  . Personal history of COVID-19 02/20/2020  . Diabetic ulcer of left midfoot associated with type 1 diabetes mellitus, with fat layer exposed ([place])   . Charcot's joint of foot, left   . Pneumonia due to COVID-19 virus 02/14/2020  . Cellulitis of left foot 01/31/2020  . Allergy, unspecified, initial encounter 07/17/2019  . Diarrhea 04/24/2019  . Bone erosion determined by x-ray   . Encephalopathy acute   . Diabetic ulcer of  left midfoot associated with diabetes mellitus due to underlying condition, with necrosis of bone ([place])   . Open wound of left foot   . Encounter for orthopedic aftercare following surgical amputation   . Foot infection   . Gangrene of toe of left foot ([place])   . Cellulitis 10/26/2018  . Diabetic ulcer of left midfoot associated with diabetes mellitus due to underlying condition, with bone involvement without evidence of necrosis ([place])   . Nausea vomiting and diarrhea 10/20/2018  . DM (diabetes mellitus), secondary, uncontrolled, with neurologic complications ([place])   . Headache 09/23/2018  . Acute osteomyelitis involving ankle and foot, left ([place])   . Anemia of chronic disease 08/18/2018  . Encounter for immunization 06/29/2018  . Hyperkalemia   . Unspecified protein-calorie malnutrition ([place]) 02/14/2018  . Encounter for removal of sutures 02/07/2018  . Fluid overload, unspecified 01/22/2018  . Osteomyelitis of ankle or foot, acute, right ([place]) 10/30/2017  . Cellulitis and abscess of toe of left foot   . Diabetic foot infection ([place])   . Insulin dependent type 1 diabetes mellitus ([place]) 10/25/2017  . Diabetic gastroparesis ([place]) 10/25/2017  . Sepsis ([place]) 10/25/2017  . Fever, unspecified 10/28/2016  . Iron deficiency anemia, unspecified 10/28/2016  . 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 Pruritus, unspecified 10/28/2016  . Secondary hyperparathyroidism of renal origin ([place]) 10/28/2016  . Shortness of breath 10/28/2016  . HTN (hypertension) 08/09/2014  .  ESRD on dialysis ([place]) 03/19/2014  . Metabolic acidosis 40/98/1191    Past Surgical History:  Procedure Laterality Date  . AMPUTATION Right 02/02/2018   Procedure: RIGHT FIFTH TOE AND METATARSAL AMPUTATION. Filetted toe flap metatarsal resection. Debridement Plantar Foot wound;  Surgeon: Evelina Bucy, DPM;  Location: [facility];  Service: Podiatry;  Laterality: Right;  . AMPUTATION Left 08/20/2018   Procedure: FIFTH  METATARSAL BONE BIOPSY;  Surgeon: Evelina Bucy, DPM;  Location: [facility];  Service: Podiatry;  Laterality: Left;  . AMPUTATION Left 10/28/2018   Procedure: LEFT GREAT TOE AMPUTATION;  Surgeon: Evelina Bucy, DPM;  Location: [facility];  Service: Podiatry;  Laterality: Left;  . AMPUTATION Left 09/16/2020   Procedure: AMPUTATION BELOW KNEE;  Surgeon: Rosetta Posner, MD;  Location: [place] [facility];  Service: Vascular;  Laterality: Left;  . AMPUTATION Left 10/15/2020   Procedure: AMPUTATION ABOVE KNEE;  Surgeon: Elam Dutch, MD;  Location: [facility];  Service: Vascular;  Laterality: Left;  . APPLICATION OF WOUND VAC  02/02/2018   Procedure: APPLICATION OF WOUND VAC  Right Foot;  Surgeon: Evelina Bucy, DPM;  Location: [facility];  Service: Podiatry;;  . APPLICATION OF WOUND VAC Left 10/28/2018   Procedure: APPLICATION OF WOUND VAC LEFT TOE;  Surgeon: Evelina Bucy, DPM;  Location: [facility];  Service: Podiatry;  Laterality: Left;  . APPLICATION OF WOUND VAC Left 11/01/2018   Procedure: APPLICATION OF WOUND VAC;  Surgeon: Evelina Bucy, DPM;  Location: [facility];  Service: Podiatry;  Laterality: Left;  . AV FISTULA PLACEMENT     left arm.  . AV FISTULA PLACEMENT Right 12/22/2016   Procedure: INSERTION OF ARTERIOVENOUS (AV) GORE-TEX GRAFT ARM;  Surgeon: Elam Dutch, MD;  Location: [facility];  Service: Vascular;  Laterality: Right;  . AV FISTULA PLACEMENT Left 05/26/2018   Procedure: INSERTION OF  ARTERIOVENOUS (AV) GORE-TEX GRAFT LEFT ARM;  Surgeon: Serafina Mitchell, MD;  Location: [facility];  Service: Vascular;  Laterality: Left;  . EYE SURGERY    . I & D EXTREMITY Right 10/31/2017   Procedure: IRRIGATION AND DEBRIDEMENT RIGHT FOOT;  Surgeon: Evelina Bucy, DPM;  Location: [place];  Service: Podiatry;  Laterality: Right;  . I & D EXTREMITY Left 08/20/2018   Procedure: IRRIGATION AND DEBRIDEMENT EXTREMITY WITH SECONDARY WOUND CLOSUREAND APPLICATION OF WOUND VAC LEFT FOOT;  Surgeon: Evelina Bucy, DPM;  Location: [facility];   Service: Podiatry;  Laterality: Left;  . I & D EXTREMITY Left 10/20/2018   Procedure: IRRIGATION AND DEBRIDEMENT LEFT FOOT  DEBRIDEMENT LATERAL FOOT WOUND;  Surgeon: Evelina Bucy, DPM;  Location: [facility];  Service: Podiatry;  Laterality: Left;  . I & D EXTREMITY Left 10/28/2018   Procedure: IRRIGATION AND DEBRIDEMENT LEFT TOE;  Surgeon: Evelina Bucy, DPM;  Location: [facility];  Service: Podiatry;  Laterality: Left;  . I & D EXTREMITY Left 09/14/2020   Procedure: IRRIGATION AND DEBRIDEMENT WRIST;  Surgeon: Leanora Cover, MD;  Location: [place] [facility];  Service: Orthopedics;  Laterality: Left;  . INSERTION OF DIALYSIS CATHETER     Right subclavian  . IR AV DIALY SHUNT INTRO NEEDLE/INTRACATH INITIAL W/PTA/IMG RIGHT Right 02/05/2018  . IR FLUORO GUIDE CV LINE RIGHT  01/31/2020  . IR THROMBECTOMY AV FISTULA W/THROMBOLYSIS/PTA INC/SHUNT/IMG LEFT Left 08/24/2018  . IR THROMBECTOMY AV FISTULA W/THROMBOLYSIS/PTA INC/SHUNT/IMG LEFT Left 01/06/2019  . IR US GUIDE VASC ACCESS LEFT  08/24/2018  . IR US GUIDE VASC ACCESS RIGHT  02/05/2018  .  IR US GUIDE VASC ACCESS RIGHT  01/31/2020  . IRRIGATION AND DEBRIDEMENT FOOT Right 10/23/2018   Procedure: Irrigation and Debridement to tendon, Left Foot;  Surgeon: Evelina Bucy, DPM;  Location: [facility];  Service: Podiatry;  Laterality: Right;  . IRRIGATION AND DEBRIDEMENT FOOT Left 11/01/2018   Procedure: IRRIGATION AND DEBRIDEMENT PARTIAL WOUND CLOSURE LOCAL TISSUE TRANSFER AND FLAP ROTATION, LEFT FOOT;  Surgeon: Evelina Bucy, DPM;  Location: [facility];  Service: Podiatry;  Laterality: Left;  . TRANSMETATARSAL AMPUTATION N/A 08/18/2018   Procedure: IRRIGATION AND DEBRIDEMENT OF LEFT 5TH TOE AND TRANSMETATARSAL, WITH PARTICAL LEFT 5TH TOE AND METATARSAL AMPUTATION, BONE BIOPSY, WOUND VAC APPLICATION.;  Surgeon: Evelina Bucy, DPM;  Location: [facility];  Service: Podiatry;  Laterality: N/A;  . UPPER EXTREMITY VENOGRAPHY N/A 11/16/2016   Procedure: Upper Extremity Venography - Right  Central;  Surgeon: Elam Dutch, MD;  Location: [facility] CV LAB;  Service: Cardiovascular;  Laterality: N/A;  . UPPER EXTREMITY VENOGRAPHY N/A 05/25/2018   Procedure: UPPER EXTREMITY VENOGRAPHY - Bilateral;  Surgeon: Marty Heck, MD;  Location: [facility] CV LAB;  Service: Cardiovascular;  Laterality: N/A;       Family History  Problem Relation Age of Onset  . Diabetes Mellitus II Other   . Diabetes Father   . Renal Disease Father        ESRD    Social History   Tobacco Use  . Smoking status: Never Smoker  . Smokeless tobacco: Never Used  Vaping Use  . Vaping Use: Never used  Substance Use Topics  . Alcohol use: No  . Drug use: No    Home Medications Prior to Admission medications   Medication Sig Start Date End Date Taking? Authorizing Provider  albuterol (VENTOLIN HFA) 108 (90 Base) MCG/ACT inhaler Inhale 2 puffs into the lungs every 6 (six) hours as needed for wheezing or shortness of breath. 02/18/20  Yes Thurnell Lose, MD  calcitRIOL (ROCALTROL) 0.5 MCG capsule Take 5 capsules (2.5 mcg total) by mouth every Monday, Wednesday, and Friday with hemodialysis. 10/26/18  Yes Hongalgi, Lenis Dickinson, MD  cinacalcet (SENSIPAR) 60 MG tablet Take 120 mg by mouth daily after supper. 04/24/20  Yes [provider]  Methoxy PEG-Epoetin Beta (MIRCERA IJ)  04/19/20 04/18/21 Yes [provider]  NOVOLOG 100 UNIT/ML injection Inject 6 Units into the skin 3 (three) times daily with meals. INJECT 6 UNITS SUBCUTANEOUSLY TWICE DAILY WITH A MEAL Patient taking differently: Inject 6 Units into the skin 2 (two) times daily with a meal. 10/20/20  Yes Domenic Polite, MD  oxyCODONE-acetaminophen (PERCOCET/ROXICET) 5-325 MG tablet Take 2 tablets by mouth every 6 (six) hours as needed for up to 12 doses for severe pain. 10/29/20  Yes Breck Coons, MD  sevelamer carbonate (RENVELA) 800 MG tablet Take 2,400-4,000 mg by mouth See admin instructions. Take 4,000 mg (5 tablets) by  mouth with meals and 2,400 mg (3 tablets) with snacks 04/27/19  Yes [provider]  Blood Glucose Monitoring Suppl ([place]) w/Device KIT 1 Bag by Does not apply route 3 (three) times daily before meals. 05/02/20   Kerin Perna, NP  glucose blood (ONETOUCH VERIO) test strip Check blood sugars three times daily 07/01/20   Kerin Perna, NP  ibuprofen (ADVIL) 600 MG tablet Take 1 tablet (600 mg total) by mouth every 6 (six) hours as needed for up to 30 doses for moderate pain. Patient not taking: Reported on 12/14/2020 10/11/20  Antonieta Pert, MD  Lancets (ONETOUCH DELICA PLUS [id number]) [place] Apply topically. 05/02/20   [provider]  oxyCODONE-acetaminophen (PERCOCET) 5-325 MG tablet Take 2 tablets by mouth every 6 (six) hours as needed for up to 15 doses for severe pain. Patient not taking: Reported on 12/14/2020 10/20/20   Domenic Polite, MD    Allergies    Patient has no known allergies.  Review of Systems   Review of Systems  Neurological: Positive for weakness.  All other systems reviewed and are negative.   Physical Exam Updated Vital Signs BP (!) 129/98   Pulse (!) 105   Temp 98.3 F (36.8 C) (Oral)   Resp (!) 24   SpO2 99%   Physical Exam Vitals and nursing note reviewed.  Constitutional:      Appearance: He is well-developed.  HENT:     Head: Normocephalic and atraumatic.  Eyes:     Conjunctiva/sclera: Conjunctivae normal.  Cardiovascular:     Rate and Rhythm: Normal rate and regular rhythm.     Heart sounds: No murmur heard.   Pulmonary:     Effort: Pulmonary effort is normal. No respiratory distress.     Breath sounds: Normal breath sounds.  Abdominal:     Palpations: Abdomen is soft.  Musculoskeletal:     Cervical back: Neck supple.  Skin:    General: Skin is warm and dry.  Neurological:     General: No focal deficit present.     Mental Status: He is alert.  Psychiatric:        Mood and Affect: Mood normal.      ED Results / Procedures / Treatments   Labs (all labs ordered are listed, but only abnormal results are displayed) Labs Reviewed  CBC WITH DIFFERENTIAL/PLATELET - Abnormal; Notable for the following components:      Result Value   RBC 4.20 (*)    Hemoglobin 11.5 (*)    HCT 36.8 (*)    RDW 17.5 (*)    All other components within normal limits  BASIC METABOLIC PANEL - Abnormal; Notable for the following components:   Sodium 133 (*)    Chloride 93 (*)    Glucose, Bld 119 (*)    BUN 26 (*)    Creatinine, Ser 8.66 (*)    Calcium 8.5 (*)    GFR, Estimated 7 (*)    All other components within normal limits    EKG None  Radiology No results found.  Procedures Procedures   Medications Ordered in ED Medications  oxyCODONE-acetaminophen (PERCOCET/ROXICET) 5-325 MG per tablet 1 tablet (1 tablet Oral Given 12/14/20 1037)  sodium chloride 0.9 % bolus 500 mL (500 mLs Intravenous New Bag/Given 12/14/20 1235)    ED Course  I have reviewed the triage vital signs and the nursing notes.  Pertinent labs & imaging results that were available during my care of the patient were reviewed by me and considered in my medical decision making (see chart for details).    MDM Rules/Calculators/A&P                          MDM: Pt has not followed up with vascular since surgery. I spoke to Dr. Oneida Alar who advised dressing.  His office will call pt with appointment time next week.  Final Clinical Impression(s) / ED Diagnoses Final diagnoses:  Unilateral complete BKA, left, subsequent encounter ([place])  Weakness    Rx / DC Orders ED Discharge Orders  None    An After Visit Summary was printed and given to the patient.    Fransico Meadow, PA-C 12/14/20 1511    Pattricia Boss, MD 12/15/20 [phone]

## 2020-12-14 NOTE — ED Notes (Signed)
[facility] called; paperwork ready.

## 2020-12-14 NOTE — ED Notes (Signed)
Refused vitals signs to be done.

## 2020-12-14 NOTE — ED Notes (Signed)
Pt asked to go to bathroom. Pt stated that he felt too weak to move to wheel chair. Techs offer to help pt move to wheel chair. Pt stated, "ill just hold it. Im too weak."

## 2020-12-14 NOTE — Telephone Encounter (Signed)
He needs an ED visit

## 2020-12-15 DIAGNOSIS — I10 Essential (primary) hypertension: Secondary | ICD-10-CM | POA: Diagnosis not present

## 2020-12-16 ENCOUNTER — Ambulatory Visit (INDEPENDENT_AMBULATORY_CARE_PROVIDER_SITE_OTHER): Payer: Self-pay | Admitting: *Deleted

## 2020-12-16 DIAGNOSIS — D688 Other specified coagulation defects: Secondary | ICD-10-CM | POA: Diagnosis not present

## 2020-12-16 DIAGNOSIS — L0889 Other specified local infections of the skin and subcutaneous tissue: Secondary | ICD-10-CM | POA: Diagnosis not present

## 2020-12-16 DIAGNOSIS — N186 End stage renal disease: Secondary | ICD-10-CM | POA: Diagnosis not present

## 2020-12-16 DIAGNOSIS — Z743 Need for continuous supervision: Secondary | ICD-10-CM | POA: Diagnosis not present

## 2020-12-16 DIAGNOSIS — I10 Essential (primary) hypertension: Secondary | ICD-10-CM | POA: Diagnosis not present

## 2020-12-16 DIAGNOSIS — R5381 Other malaise: Secondary | ICD-10-CM | POA: Diagnosis not present

## 2020-12-16 DIAGNOSIS — M2550 Pain in unspecified joint: Secondary | ICD-10-CM | POA: Diagnosis not present

## 2020-12-16 DIAGNOSIS — D631 Anemia in chronic kidney disease: Secondary | ICD-10-CM | POA: Diagnosis not present

## 2020-12-16 DIAGNOSIS — N2581 Secondary hyperparathyroidism of renal origin: Secondary | ICD-10-CM | POA: Diagnosis not present

## 2020-12-16 DIAGNOSIS — Z992 Dependence on renal dialysis: Secondary | ICD-10-CM | POA: Diagnosis not present

## 2020-12-16 DIAGNOSIS — T8249XA Other complication of vascular dialysis catheter, initial encounter: Secondary | ICD-10-CM | POA: Diagnosis not present

## 2020-12-16 DIAGNOSIS — Z7401 Bed confinement status: Secondary | ICD-10-CM | POA: Diagnosis not present

## 2020-12-16 NOTE — Telephone Encounter (Signed)
Patient was seen 3/19- lethargy, diarrhea, not eating, labored breathing.  Leg amputation and revision- January. Wound was doing well- but for 1 week- wound is bleeding and changing color.  Mother states staples are still present and wound is changing color- bleeding.  Asked her if patient has had follow up by surgeon since the last surgery- she states no- she was not aware that was needed- advised her to contact surgeon now- reviewed her documentation and she does have the information needed. She will call Dr Murrell Converse office immediately.   Reason for Disposition . Sounds like a serious complication to the triager  Answer Assessment - Initial Assessment Questions 1. SYMPTOM: "What's the main symptom you're concerned about?" (e.g., redness, pain, drainage)     Bleeding from wound site, staples inflamed, changing in color 2. ONSET: "When did changes  start?"     1 week 3. SURGERY: "What surgery was performed?"     amputation of leg 4. DATE of SURGERY: "When was surgery performed?"      10/14/20 5. INCISION SITE: "Where is the incision located?"      Above the knee 6. REDNESS: "Is there any redness at the incision site?" If yes, ask: "How wide across is the redness?" (Inches, centimeters)      Gray in color 7. PAIN: "Is there any pain?" If Yes, ask: "How bad is it?"  (Scale 1-10; or mild, moderate, severe)     Yes- post surgical 8. BLEEDING: "Is there any bleeding?" If Yes, ask: "How much?" and "Where?" yes  Protocols used: POST-OP INCISION SYMPTOMS AND QUESTIONS-A-AH

## 2020-12-16 NOTE — Telephone Encounter (Signed)
PEC RN has properly advised patient.

## 2020-12-17 DIAGNOSIS — I10 Essential (primary) hypertension: Secondary | ICD-10-CM | POA: Diagnosis not present

## 2020-12-18 DIAGNOSIS — I10 Essential (primary) hypertension: Secondary | ICD-10-CM | POA: Diagnosis not present

## 2020-12-19 ENCOUNTER — Telehealth: Payer: Self-pay

## 2020-12-19 DIAGNOSIS — I10 Essential (primary) hypertension: Secondary | ICD-10-CM | POA: Diagnosis not present

## 2020-12-19 NOTE — Telephone Encounter (Signed)
Patient unable to make appt today due to stairs/transport. Rodena Piety called and spoke to CEF about patient condition - he will be added on to PA schedule tomorrow per MD.

## 2020-12-20 ENCOUNTER — Ambulatory Visit (INDEPENDENT_AMBULATORY_CARE_PROVIDER_SITE_OTHER): Payer: Medicare HMO | Admitting: Physician Assistant

## 2020-12-20 ENCOUNTER — Other Ambulatory Visit: Payer: Self-pay | Admitting: Vascular Surgery

## 2020-12-20 ENCOUNTER — Other Ambulatory Visit: Payer: Self-pay

## 2020-12-20 VITALS — BP 122/92 | HR 92 | Temp 98.2°F | Resp 20 | Ht 66.0 in | Wt 180.0 lb

## 2020-12-20 DIAGNOSIS — D631 Anemia in chronic kidney disease: Secondary | ICD-10-CM | POA: Diagnosis not present

## 2020-12-20 DIAGNOSIS — N2581 Secondary hyperparathyroidism of renal origin: Secondary | ICD-10-CM | POA: Diagnosis not present

## 2020-12-20 DIAGNOSIS — I739 Peripheral vascular disease, unspecified: Secondary | ICD-10-CM

## 2020-12-20 DIAGNOSIS — T8131XA Disruption of external operation (surgical) wound, not elsewhere classified, initial encounter: Secondary | ICD-10-CM

## 2020-12-20 DIAGNOSIS — N186 End stage renal disease: Secondary | ICD-10-CM | POA: Diagnosis not present

## 2020-12-20 DIAGNOSIS — I10 Essential (primary) hypertension: Secondary | ICD-10-CM | POA: Diagnosis not present

## 2020-12-20 DIAGNOSIS — L0889 Other specified local infections of the skin and subcutaneous tissue: Secondary | ICD-10-CM | POA: Diagnosis not present

## 2020-12-20 DIAGNOSIS — Z992 Dependence on renal dialysis: Secondary | ICD-10-CM | POA: Diagnosis not present

## 2020-12-20 DIAGNOSIS — D688 Other specified coagulation defects: Secondary | ICD-10-CM | POA: Diagnosis not present

## 2020-12-20 DIAGNOSIS — R279 Unspecified lack of coordination: Secondary | ICD-10-CM | POA: Diagnosis not present

## 2020-12-20 DIAGNOSIS — T8249XA Other complication of vascular dialysis catheter, initial encounter: Secondary | ICD-10-CM | POA: Diagnosis not present

## 2020-12-20 NOTE — Addendum Note (Signed)
Addended byDoylene Bode on: 12/20/2020 01:26 PM   Modules accepted: Orders

## 2020-12-20 NOTE — Progress Notes (Signed)
  POST OPERATIVE OFFICE NOTE    CC:  F/u for surgery  HPI:  This is a 40 y.o. male who is s/p right AKA on 10/15/2020 by Dr. Oneida Alar. Present for post-op follow-up. Reports objective fever no chills. His mother accompanies him today.  No Known Allergies  Current Outpatient Medications  Medication Sig Dispense Refill  . albuterol (VENTOLIN HFA) 108 (90 Base) MCG/ACT inhaler Inhale 2 puffs into the lungs every 6 (six) hours as needed for wheezing or shortness of breath. 6.7 g 0  . Blood Glucose Monitoring Suppl (ONETOUCH VERIO FLEX SYSTEM) w/Device KIT 1 Bag by Does not apply route 3 (three) times daily before meals. 1 kit 0  . calcitRIOL (ROCALTROL) 0.5 MCG capsule Take 5 capsules (2.5 mcg total) by mouth every Monday, Wednesday, and Friday with hemodialysis. 30 capsule 0  . cinacalcet (SENSIPAR) 60 MG tablet Take 120 mg by mouth daily after supper.    Marland Kitchen glucose blood (ONETOUCH VERIO) test strip Check blood sugars three times daily 100 each 8  . Lancets (ONETOUCH DELICA PLUS [id number]) MISC Apply topically.    . Methoxy PEG-Epoetin Beta (MIRCERA IJ)     . NOVOLOG 100 UNIT/ML injection Inject 6 Units into the skin 3 (three) times daily with meals. INJECT 6 UNITS SUBCUTANEOUSLY TWICE DAILY WITH A MEAL (Patient taking differently: Inject 6 Units into the skin 2 (two) times daily with a meal.) 10 mL 3  . oxyCODONE-acetaminophen (PERCOCET) 5-325 MG tablet Take 1 tablet by mouth every 4 (four) hours as needed for severe pain. 20 tablet 0  . sevelamer carbonate (RENVELA) 800 MG tablet Take 2,400-4,000 mg by mouth See admin instructions. Take 4,000 mg (5 tablets) by mouth with meals and 2,400 mg (3 tablets) with snacks     No current facility-administered medications for this visit.     ROS:  See HPI  BP (!) 122/92 (BP Location: Right Arm, Patient Position: Supine, Cuff Size: Large)   Pulse 92   Temp 98.2 F (36.8 C) (Temporal)   Resp 20   Ht $R'5\' 6"'rM$  (1.676 m)   BMI 29.05 kg/m   Physical  Exam:  General appearance: in NAD Respiratory: non-labored Incision: small skin edge opening draining cloudy fluid without frank pus Extremities:  left residual limb: tract in mid anterior flap tunnels to 3 cm Neuro: A and O times 4   Assessment/Plan:  This is a 40 y.o. male who is s/p: left AKA on 10/15/2020. Presents with drainage from anterior flap. Discussed with Dr. Donzetta Matters who examined patient. Will discontinue lateral and mid staples and pack wound with Iodoform. Culture obtained. Follow-up next week with Dr. Oneida Alar. His mother/family member is able to do this at home once a day.  I spoke with [facility] vascular PA Courtney regarding IV antibiotics during HD on MWF> I recommend vancomycin (renal dosing) until culture results are back. She was able to order this and will initiate today at dialysis.  Risa Grill, PA-C [facility] [phone]  Clinic MD:  Donzetta Matters

## 2020-12-21 DIAGNOSIS — I10 Essential (primary) hypertension: Secondary | ICD-10-CM | POA: Diagnosis not present

## 2020-12-22 DIAGNOSIS — I10 Essential (primary) hypertension: Secondary | ICD-10-CM | POA: Diagnosis not present

## 2020-12-23 DIAGNOSIS — T8249XA Other complication of vascular dialysis catheter, initial encounter: Secondary | ICD-10-CM | POA: Diagnosis not present

## 2020-12-23 DIAGNOSIS — R5381 Other malaise: Secondary | ICD-10-CM | POA: Diagnosis not present

## 2020-12-23 DIAGNOSIS — N2581 Secondary hyperparathyroidism of renal origin: Secondary | ICD-10-CM | POA: Diagnosis not present

## 2020-12-23 DIAGNOSIS — Z992 Dependence on renal dialysis: Secondary | ICD-10-CM | POA: Diagnosis not present

## 2020-12-23 DIAGNOSIS — Z7401 Bed confinement status: Secondary | ICD-10-CM | POA: Diagnosis not present

## 2020-12-23 DIAGNOSIS — L0889 Other specified local infections of the skin and subcutaneous tissue: Secondary | ICD-10-CM | POA: Diagnosis not present

## 2020-12-23 DIAGNOSIS — Z743 Need for continuous supervision: Secondary | ICD-10-CM | POA: Diagnosis not present

## 2020-12-23 DIAGNOSIS — D688 Other specified coagulation defects: Secondary | ICD-10-CM | POA: Diagnosis not present

## 2020-12-23 DIAGNOSIS — N186 End stage renal disease: Secondary | ICD-10-CM | POA: Diagnosis not present

## 2020-12-23 DIAGNOSIS — I10 Essential (primary) hypertension: Secondary | ICD-10-CM | POA: Diagnosis not present

## 2020-12-23 DIAGNOSIS — M2550 Pain in unspecified joint: Secondary | ICD-10-CM | POA: Diagnosis not present

## 2020-12-23 LAB — WOUND CULTURE
MICRO NUMBER:: 11693759
SPECIMEN QUALITY:: ADEQUATE

## 2020-12-23 LAB — HOUSE ACCOUNT TRACKING

## 2020-12-24 DIAGNOSIS — I10 Essential (primary) hypertension: Secondary | ICD-10-CM | POA: Diagnosis not present

## 2020-12-25 DIAGNOSIS — I10 Essential (primary) hypertension: Secondary | ICD-10-CM | POA: Diagnosis not present

## 2020-12-26 ENCOUNTER — Ambulatory Visit (INDEPENDENT_AMBULATORY_CARE_PROVIDER_SITE_OTHER): Payer: Medicare HMO | Admitting: Vascular Surgery

## 2020-12-26 ENCOUNTER — Ambulatory Visit (INDEPENDENT_AMBULATORY_CARE_PROVIDER_SITE_OTHER): Payer: Medicare HMO | Admitting: Primary Care

## 2020-12-26 ENCOUNTER — Other Ambulatory Visit: Payer: Self-pay

## 2020-12-26 ENCOUNTER — Encounter: Payer: Self-pay | Admitting: Vascular Surgery

## 2020-12-26 VITALS — BP 135/103 | HR 73 | Temp 98.0°F | Resp 20 | Ht 66.0 in | Wt 180.0 lb

## 2020-12-26 DIAGNOSIS — Z992 Dependence on renal dialysis: Secondary | ICD-10-CM | POA: Diagnosis not present

## 2020-12-26 DIAGNOSIS — I739 Peripheral vascular disease, unspecified: Secondary | ICD-10-CM

## 2020-12-26 DIAGNOSIS — R279 Unspecified lack of coordination: Secondary | ICD-10-CM | POA: Diagnosis not present

## 2020-12-26 DIAGNOSIS — I10 Essential (primary) hypertension: Secondary | ICD-10-CM | POA: Diagnosis not present

## 2020-12-26 DIAGNOSIS — N186 End stage renal disease: Secondary | ICD-10-CM | POA: Diagnosis not present

## 2020-12-26 DIAGNOSIS — I129 Hypertensive chronic kidney disease with stage 1 through stage 4 chronic kidney disease, or unspecified chronic kidney disease: Secondary | ICD-10-CM | POA: Diagnosis not present

## 2020-12-26 DIAGNOSIS — R5381 Other malaise: Secondary | ICD-10-CM | POA: Diagnosis not present

## 2020-12-26 DIAGNOSIS — Z743 Need for continuous supervision: Secondary | ICD-10-CM | POA: Diagnosis not present

## 2020-12-26 NOTE — Progress Notes (Signed)
Patient is a 40 year old male who returns for follow-up today.  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  He was lost to follow-up after that.  He was seen in our office last week and had several staples removed.  He has developed a sinus tract draining from the central aspect of his above-knee amputation.  He has no fever or chills.  This area was opened and packed.  His mom states that she cannot do the packing at home.  She is requesting home health.  Patient is also on IV antibiotics with dialysis.  He has no fever or chills.  Physical exam:  Vitals:   12/26/20 1106  BP: (!) 135/103  Pulse: 73  Resp: 20  Temp: 98 F (36.7 C)  SpO2: 98%  Weight: 180 lb (81.6 kg)  Height: 5\' 6"  (1.676 m)    Extremities: Left above-knee amputation no surrounding erythema no fluctuance there are 6 staples in the central aspect of the incision which were still in place.  There is a 2 mm opening in the very center of his amputation stump which is approximately 4 cm in depth but not a very large cavity.  There is some clear drainage from this.  Assessment: Draining sinus tract left above-knee amputation at risk for osteomyelitis but currently no signs of systemic infection.  Plan: We will try to set up home health for wound packing of this as well as home health physical therapy since the patient feels uncomfortable transferring bed to chair at this point.  He has had falls previously.  He will follow up in 3 to 4 weeks for wound check in the left above-knee amputation.  Remainder of staples were removed today.  Ruta Hinds, MD [facility] Office: [phone]

## 2020-12-27 ENCOUNTER — Other Ambulatory Visit: Payer: Self-pay

## 2020-12-27 ENCOUNTER — Telehealth: Payer: Self-pay

## 2020-12-27 DIAGNOSIS — I739 Peripheral vascular disease, unspecified: Secondary | ICD-10-CM

## 2020-12-27 DIAGNOSIS — D688 Other specified coagulation defects: Secondary | ICD-10-CM | POA: Diagnosis not present

## 2020-12-27 DIAGNOSIS — Z743 Need for continuous supervision: Secondary | ICD-10-CM | POA: Diagnosis not present

## 2020-12-27 DIAGNOSIS — T8249XA Other complication of vascular dialysis catheter, initial encounter: Secondary | ICD-10-CM | POA: Diagnosis not present

## 2020-12-27 DIAGNOSIS — N2581 Secondary hyperparathyroidism of renal origin: Secondary | ICD-10-CM | POA: Diagnosis not present

## 2020-12-27 DIAGNOSIS — M2550 Pain in unspecified joint: Secondary | ICD-10-CM | POA: Diagnosis not present

## 2020-12-27 DIAGNOSIS — Z7401 Bed confinement status: Secondary | ICD-10-CM | POA: Diagnosis not present

## 2020-12-27 DIAGNOSIS — N186 End stage renal disease: Secondary | ICD-10-CM | POA: Diagnosis not present

## 2020-12-27 DIAGNOSIS — R5381 Other malaise: Secondary | ICD-10-CM | POA: Diagnosis not present

## 2020-12-27 DIAGNOSIS — Z992 Dependence on renal dialysis: Secondary | ICD-10-CM | POA: Diagnosis not present

## 2020-12-27 DIAGNOSIS — I10 Essential (primary) hypertension: Secondary | ICD-10-CM | POA: Diagnosis not present

## 2020-12-27 DIAGNOSIS — L0889 Other specified local infections of the skin and subcutaneous tissue: Secondary | ICD-10-CM | POA: Diagnosis not present

## 2020-12-27 NOTE — Telephone Encounter (Signed)
S/w Angie @ [facility] to set up home health - pt needs PT to work on bed to chair transfer and nursing for wound care - NS wet to dry L AKA. Will contact patient for start of care Monday or Tuesday.

## 2020-12-28 DIAGNOSIS — I10 Essential (primary) hypertension: Secondary | ICD-10-CM | POA: Diagnosis not present

## 2020-12-29 DIAGNOSIS — I10 Essential (primary) hypertension: Secondary | ICD-10-CM | POA: Diagnosis not present

## 2020-12-30 DIAGNOSIS — I10 Essential (primary) hypertension: Secondary | ICD-10-CM | POA: Diagnosis not present

## 2020-12-30 DIAGNOSIS — R5381 Other malaise: Secondary | ICD-10-CM | POA: Diagnosis not present

## 2020-12-30 DIAGNOSIS — D631 Anemia in chronic kidney disease: Secondary | ICD-10-CM | POA: Diagnosis not present

## 2020-12-30 DIAGNOSIS — N186 End stage renal disease: Secondary | ICD-10-CM | POA: Diagnosis not present

## 2020-12-30 DIAGNOSIS — D688 Other specified coagulation defects: Secondary | ICD-10-CM | POA: Diagnosis not present

## 2020-12-30 DIAGNOSIS — L0889 Other specified local infections of the skin and subcutaneous tissue: Secondary | ICD-10-CM | POA: Diagnosis not present

## 2020-12-30 DIAGNOSIS — Z992 Dependence on renal dialysis: Secondary | ICD-10-CM | POA: Diagnosis not present

## 2020-12-30 DIAGNOSIS — T8249XA Other complication of vascular dialysis catheter, initial encounter: Secondary | ICD-10-CM | POA: Diagnosis not present

## 2020-12-30 DIAGNOSIS — M2550 Pain in unspecified joint: Secondary | ICD-10-CM | POA: Diagnosis not present

## 2020-12-30 DIAGNOSIS — N2581 Secondary hyperparathyroidism of renal origin: Secondary | ICD-10-CM | POA: Diagnosis not present

## 2020-12-30 DIAGNOSIS — Z743 Need for continuous supervision: Secondary | ICD-10-CM | POA: Diagnosis not present

## 2020-12-30 DIAGNOSIS — Z7401 Bed confinement status: Secondary | ICD-10-CM | POA: Diagnosis not present

## 2020-12-31 DIAGNOSIS — I10 Essential (primary) hypertension: Secondary | ICD-10-CM | POA: Diagnosis not present

## 2021-01-01 DIAGNOSIS — M2550 Pain in unspecified joint: Secondary | ICD-10-CM | POA: Diagnosis not present

## 2021-01-01 DIAGNOSIS — L0889 Other specified local infections of the skin and subcutaneous tissue: Secondary | ICD-10-CM | POA: Diagnosis not present

## 2021-01-01 DIAGNOSIS — T8249XA Other complication of vascular dialysis catheter, initial encounter: Secondary | ICD-10-CM | POA: Diagnosis not present

## 2021-01-01 DIAGNOSIS — N2581 Secondary hyperparathyroidism of renal origin: Secondary | ICD-10-CM | POA: Diagnosis not present

## 2021-01-01 DIAGNOSIS — I10 Essential (primary) hypertension: Secondary | ICD-10-CM | POA: Diagnosis not present

## 2021-01-01 DIAGNOSIS — D631 Anemia in chronic kidney disease: Secondary | ICD-10-CM | POA: Diagnosis not present

## 2021-01-01 DIAGNOSIS — N186 End stage renal disease: Secondary | ICD-10-CM | POA: Diagnosis not present

## 2021-01-01 DIAGNOSIS — Z992 Dependence on renal dialysis: Secondary | ICD-10-CM | POA: Diagnosis not present

## 2021-01-01 DIAGNOSIS — Z743 Need for continuous supervision: Secondary | ICD-10-CM | POA: Diagnosis not present

## 2021-01-01 DIAGNOSIS — D688 Other specified coagulation defects: Secondary | ICD-10-CM | POA: Diagnosis not present

## 2021-01-01 DIAGNOSIS — Z7401 Bed confinement status: Secondary | ICD-10-CM | POA: Diagnosis not present

## 2021-01-01 DIAGNOSIS — R531 Weakness: Secondary | ICD-10-CM | POA: Diagnosis not present

## 2021-01-01 DIAGNOSIS — R5381 Other malaise: Secondary | ICD-10-CM | POA: Diagnosis not present

## 2021-01-02 DIAGNOSIS — Z992 Dependence on renal dialysis: Secondary | ICD-10-CM | POA: Diagnosis not present

## 2021-01-02 DIAGNOSIS — D631 Anemia in chronic kidney disease: Secondary | ICD-10-CM | POA: Diagnosis not present

## 2021-01-02 DIAGNOSIS — D688 Other specified coagulation defects: Secondary | ICD-10-CM | POA: Diagnosis not present

## 2021-01-02 DIAGNOSIS — N186 End stage renal disease: Secondary | ICD-10-CM | POA: Diagnosis not present

## 2021-01-02 DIAGNOSIS — N2581 Secondary hyperparathyroidism of renal origin: Secondary | ICD-10-CM | POA: Diagnosis not present

## 2021-01-02 DIAGNOSIS — R69 Illness, unspecified: Secondary | ICD-10-CM | POA: Diagnosis not present

## 2021-01-02 DIAGNOSIS — T8249XA Other complication of vascular dialysis catheter, initial encounter: Secondary | ICD-10-CM | POA: Diagnosis not present

## 2021-01-02 DIAGNOSIS — I120 Hypertensive chronic kidney disease with stage 5 chronic kidney disease or end stage renal disease: Secondary | ICD-10-CM | POA: Diagnosis not present

## 2021-01-02 DIAGNOSIS — E440 Moderate protein-calorie malnutrition: Secondary | ICD-10-CM | POA: Diagnosis not present

## 2021-01-02 DIAGNOSIS — E1151 Type 2 diabetes mellitus with diabetic peripheral angiopathy without gangrene: Secondary | ICD-10-CM | POA: Diagnosis not present

## 2021-01-02 DIAGNOSIS — I10 Essential (primary) hypertension: Secondary | ICD-10-CM | POA: Diagnosis not present

## 2021-01-02 DIAGNOSIS — T8781 Dehiscence of amputation stump: Secondary | ICD-10-CM | POA: Diagnosis not present

## 2021-01-02 DIAGNOSIS — L0889 Other specified local infections of the skin and subcutaneous tissue: Secondary | ICD-10-CM | POA: Diagnosis not present

## 2021-01-02 DIAGNOSIS — E1122 Type 2 diabetes mellitus with diabetic chronic kidney disease: Secondary | ICD-10-CM | POA: Diagnosis not present

## 2021-01-02 DIAGNOSIS — E1149 Type 2 diabetes mellitus with other diabetic neurological complication: Secondary | ICD-10-CM | POA: Diagnosis not present

## 2021-01-03 ENCOUNTER — Telehealth: Payer: Self-pay

## 2021-01-03 DIAGNOSIS — Z992 Dependence on renal dialysis: Secondary | ICD-10-CM | POA: Diagnosis not present

## 2021-01-03 DIAGNOSIS — T8249XA Other complication of vascular dialysis catheter, initial encounter: Secondary | ICD-10-CM | POA: Diagnosis not present

## 2021-01-03 DIAGNOSIS — M2550 Pain in unspecified joint: Secondary | ICD-10-CM | POA: Diagnosis not present

## 2021-01-03 DIAGNOSIS — N186 End stage renal disease: Secondary | ICD-10-CM | POA: Diagnosis not present

## 2021-01-03 DIAGNOSIS — N2581 Secondary hyperparathyroidism of renal origin: Secondary | ICD-10-CM | POA: Diagnosis not present

## 2021-01-03 DIAGNOSIS — D688 Other specified coagulation defects: Secondary | ICD-10-CM | POA: Diagnosis not present

## 2021-01-03 DIAGNOSIS — R5381 Other malaise: Secondary | ICD-10-CM | POA: Diagnosis not present

## 2021-01-03 DIAGNOSIS — D631 Anemia in chronic kidney disease: Secondary | ICD-10-CM | POA: Diagnosis not present

## 2021-01-03 DIAGNOSIS — I10 Essential (primary) hypertension: Secondary | ICD-10-CM | POA: Diagnosis not present

## 2021-01-03 DIAGNOSIS — Z743 Need for continuous supervision: Secondary | ICD-10-CM | POA: Diagnosis not present

## 2021-01-03 DIAGNOSIS — Z7401 Bed confinement status: Secondary | ICD-10-CM | POA: Diagnosis not present

## 2021-01-03 DIAGNOSIS — L0889 Other specified local infections of the skin and subcutaneous tissue: Secondary | ICD-10-CM | POA: Diagnosis not present

## 2021-01-03 NOTE — Telephone Encounter (Addendum)
Nurse from [facility] called - she would like to continue packing wound daily with iodoform until she can obtain silver gel and then pack 3x per week. Advised this was okay - they will omit visits on HD days if needed  Wound care HH was discontinued for patient yesterday - but when RN went out to d/c him, found a 2 cm tunnel with serosanguinous drainage.  They would like to irrigate the tunnel with saline and and use silver wound gel. Advised this was fine and gave verbal orders to see patient 2x a week for a few more weeks.

## 2021-01-04 DIAGNOSIS — E440 Moderate protein-calorie malnutrition: Secondary | ICD-10-CM | POA: Diagnosis not present

## 2021-01-04 DIAGNOSIS — T8781 Dehiscence of amputation stump: Secondary | ICD-10-CM | POA: Diagnosis not present

## 2021-01-04 DIAGNOSIS — I120 Hypertensive chronic kidney disease with stage 5 chronic kidney disease or end stage renal disease: Secondary | ICD-10-CM | POA: Diagnosis not present

## 2021-01-04 DIAGNOSIS — E1149 Type 2 diabetes mellitus with other diabetic neurological complication: Secondary | ICD-10-CM | POA: Diagnosis not present

## 2021-01-04 DIAGNOSIS — E1122 Type 2 diabetes mellitus with diabetic chronic kidney disease: Secondary | ICD-10-CM | POA: Diagnosis not present

## 2021-01-04 DIAGNOSIS — N186 End stage renal disease: Secondary | ICD-10-CM | POA: Diagnosis not present

## 2021-01-04 DIAGNOSIS — Z992 Dependence on renal dialysis: Secondary | ICD-10-CM | POA: Diagnosis not present

## 2021-01-04 DIAGNOSIS — I10 Essential (primary) hypertension: Secondary | ICD-10-CM | POA: Diagnosis not present

## 2021-01-04 DIAGNOSIS — D631 Anemia in chronic kidney disease: Secondary | ICD-10-CM | POA: Diagnosis not present

## 2021-01-04 DIAGNOSIS — R69 Illness, unspecified: Secondary | ICD-10-CM | POA: Diagnosis not present

## 2021-01-04 DIAGNOSIS — E1151 Type 2 diabetes mellitus with diabetic peripheral angiopathy without gangrene: Secondary | ICD-10-CM | POA: Diagnosis not present

## 2021-01-05 DIAGNOSIS — E440 Moderate protein-calorie malnutrition: Secondary | ICD-10-CM | POA: Diagnosis not present

## 2021-01-05 DIAGNOSIS — N186 End stage renal disease: Secondary | ICD-10-CM | POA: Diagnosis not present

## 2021-01-05 DIAGNOSIS — I10 Essential (primary) hypertension: Secondary | ICD-10-CM | POA: Diagnosis not present

## 2021-01-05 DIAGNOSIS — E1122 Type 2 diabetes mellitus with diabetic chronic kidney disease: Secondary | ICD-10-CM | POA: Diagnosis not present

## 2021-01-05 DIAGNOSIS — E1149 Type 2 diabetes mellitus with other diabetic neurological complication: Secondary | ICD-10-CM | POA: Diagnosis not present

## 2021-01-05 DIAGNOSIS — R69 Illness, unspecified: Secondary | ICD-10-CM | POA: Diagnosis not present

## 2021-01-05 DIAGNOSIS — E1151 Type 2 diabetes mellitus with diabetic peripheral angiopathy without gangrene: Secondary | ICD-10-CM | POA: Diagnosis not present

## 2021-01-05 DIAGNOSIS — D631 Anemia in chronic kidney disease: Secondary | ICD-10-CM | POA: Diagnosis not present

## 2021-01-05 DIAGNOSIS — I120 Hypertensive chronic kidney disease with stage 5 chronic kidney disease or end stage renal disease: Secondary | ICD-10-CM | POA: Diagnosis not present

## 2021-01-05 DIAGNOSIS — Z992 Dependence on renal dialysis: Secondary | ICD-10-CM | POA: Diagnosis not present

## 2021-01-05 DIAGNOSIS — T8781 Dehiscence of amputation stump: Secondary | ICD-10-CM | POA: Diagnosis not present

## 2021-01-06 DIAGNOSIS — R519 Headache, unspecified: Secondary | ICD-10-CM | POA: Diagnosis not present

## 2021-01-06 DIAGNOSIS — Z743 Need for continuous supervision: Secondary | ICD-10-CM | POA: Diagnosis not present

## 2021-01-06 DIAGNOSIS — E8770 Fluid overload, unspecified: Secondary | ICD-10-CM | POA: Diagnosis not present

## 2021-01-06 DIAGNOSIS — N2581 Secondary hyperparathyroidism of renal origin: Secondary | ICD-10-CM | POA: Diagnosis not present

## 2021-01-06 DIAGNOSIS — Z4781 Encounter for orthopedic aftercare following surgical amputation: Secondary | ICD-10-CM | POA: Diagnosis not present

## 2021-01-06 DIAGNOSIS — D631 Anemia in chronic kidney disease: Secondary | ICD-10-CM | POA: Diagnosis not present

## 2021-01-06 DIAGNOSIS — I10 Essential (primary) hypertension: Secondary | ICD-10-CM | POA: Diagnosis not present

## 2021-01-06 DIAGNOSIS — T8249XA Other complication of vascular dialysis catheter, initial encounter: Secondary | ICD-10-CM | POA: Diagnosis not present

## 2021-01-06 DIAGNOSIS — Z992 Dependence on renal dialysis: Secondary | ICD-10-CM | POA: Diagnosis not present

## 2021-01-06 DIAGNOSIS — R5381 Other malaise: Secondary | ICD-10-CM | POA: Diagnosis not present

## 2021-01-06 DIAGNOSIS — D688 Other specified coagulation defects: Secondary | ICD-10-CM | POA: Diagnosis not present

## 2021-01-06 DIAGNOSIS — R279 Unspecified lack of coordination: Secondary | ICD-10-CM | POA: Diagnosis not present

## 2021-01-06 DIAGNOSIS — E11621 Type 2 diabetes mellitus with foot ulcer: Secondary | ICD-10-CM | POA: Diagnosis not present

## 2021-01-06 DIAGNOSIS — S91302A Unspecified open wound, left foot, initial encounter: Secondary | ICD-10-CM | POA: Diagnosis not present

## 2021-01-06 DIAGNOSIS — N186 End stage renal disease: Secondary | ICD-10-CM | POA: Diagnosis not present

## 2021-01-07 DIAGNOSIS — E1151 Type 2 diabetes mellitus with diabetic peripheral angiopathy without gangrene: Secondary | ICD-10-CM | POA: Diagnosis not present

## 2021-01-07 DIAGNOSIS — N186 End stage renal disease: Secondary | ICD-10-CM | POA: Diagnosis not present

## 2021-01-07 DIAGNOSIS — T8781 Dehiscence of amputation stump: Secondary | ICD-10-CM | POA: Diagnosis not present

## 2021-01-07 DIAGNOSIS — E1149 Type 2 diabetes mellitus with other diabetic neurological complication: Secondary | ICD-10-CM | POA: Diagnosis not present

## 2021-01-07 DIAGNOSIS — Z992 Dependence on renal dialysis: Secondary | ICD-10-CM | POA: Diagnosis not present

## 2021-01-07 DIAGNOSIS — R69 Illness, unspecified: Secondary | ICD-10-CM | POA: Diagnosis not present

## 2021-01-07 DIAGNOSIS — I120 Hypertensive chronic kidney disease with stage 5 chronic kidney disease or end stage renal disease: Secondary | ICD-10-CM | POA: Diagnosis not present

## 2021-01-07 DIAGNOSIS — I10 Essential (primary) hypertension: Secondary | ICD-10-CM | POA: Diagnosis not present

## 2021-01-07 DIAGNOSIS — E1122 Type 2 diabetes mellitus with diabetic chronic kidney disease: Secondary | ICD-10-CM | POA: Diagnosis not present

## 2021-01-07 DIAGNOSIS — D631 Anemia in chronic kidney disease: Secondary | ICD-10-CM | POA: Diagnosis not present

## 2021-01-07 DIAGNOSIS — E440 Moderate protein-calorie malnutrition: Secondary | ICD-10-CM | POA: Diagnosis not present

## 2021-01-08 DIAGNOSIS — Z992 Dependence on renal dialysis: Secondary | ICD-10-CM | POA: Diagnosis not present

## 2021-01-08 DIAGNOSIS — N2581 Secondary hyperparathyroidism of renal origin: Secondary | ICD-10-CM | POA: Diagnosis not present

## 2021-01-08 DIAGNOSIS — E8770 Fluid overload, unspecified: Secondary | ICD-10-CM | POA: Diagnosis not present

## 2021-01-08 DIAGNOSIS — T8249XA Other complication of vascular dialysis catheter, initial encounter: Secondary | ICD-10-CM | POA: Diagnosis not present

## 2021-01-08 DIAGNOSIS — N186 End stage renal disease: Secondary | ICD-10-CM | POA: Diagnosis not present

## 2021-01-08 DIAGNOSIS — R5381 Other malaise: Secondary | ICD-10-CM | POA: Diagnosis not present

## 2021-01-08 DIAGNOSIS — R279 Unspecified lack of coordination: Secondary | ICD-10-CM | POA: Diagnosis not present

## 2021-01-08 DIAGNOSIS — E11621 Type 2 diabetes mellitus with foot ulcer: Secondary | ICD-10-CM | POA: Diagnosis not present

## 2021-01-08 DIAGNOSIS — Z743 Need for continuous supervision: Secondary | ICD-10-CM | POA: Diagnosis not present

## 2021-01-08 DIAGNOSIS — D631 Anemia in chronic kidney disease: Secondary | ICD-10-CM | POA: Diagnosis not present

## 2021-01-08 DIAGNOSIS — I10 Essential (primary) hypertension: Secondary | ICD-10-CM | POA: Diagnosis not present

## 2021-01-08 DIAGNOSIS — R519 Headache, unspecified: Secondary | ICD-10-CM | POA: Diagnosis not present

## 2021-01-08 DIAGNOSIS — D688 Other specified coagulation defects: Secondary | ICD-10-CM | POA: Diagnosis not present

## 2021-01-09 DIAGNOSIS — I10 Essential (primary) hypertension: Secondary | ICD-10-CM | POA: Diagnosis not present

## 2021-01-09 DIAGNOSIS — T8781 Dehiscence of amputation stump: Secondary | ICD-10-CM | POA: Diagnosis not present

## 2021-01-09 DIAGNOSIS — E440 Moderate protein-calorie malnutrition: Secondary | ICD-10-CM | POA: Diagnosis not present

## 2021-01-09 DIAGNOSIS — I120 Hypertensive chronic kidney disease with stage 5 chronic kidney disease or end stage renal disease: Secondary | ICD-10-CM | POA: Diagnosis not present

## 2021-01-09 DIAGNOSIS — D631 Anemia in chronic kidney disease: Secondary | ICD-10-CM | POA: Diagnosis not present

## 2021-01-09 DIAGNOSIS — E1149 Type 2 diabetes mellitus with other diabetic neurological complication: Secondary | ICD-10-CM | POA: Diagnosis not present

## 2021-01-09 DIAGNOSIS — Z992 Dependence on renal dialysis: Secondary | ICD-10-CM | POA: Diagnosis not present

## 2021-01-09 DIAGNOSIS — R69 Illness, unspecified: Secondary | ICD-10-CM | POA: Diagnosis not present

## 2021-01-09 DIAGNOSIS — E1122 Type 2 diabetes mellitus with diabetic chronic kidney disease: Secondary | ICD-10-CM | POA: Diagnosis not present

## 2021-01-09 DIAGNOSIS — N186 End stage renal disease: Secondary | ICD-10-CM | POA: Diagnosis not present

## 2021-01-09 DIAGNOSIS — E1151 Type 2 diabetes mellitus with diabetic peripheral angiopathy without gangrene: Secondary | ICD-10-CM | POA: Diagnosis not present

## 2021-01-10 DIAGNOSIS — D688 Other specified coagulation defects: Secondary | ICD-10-CM | POA: Diagnosis not present

## 2021-01-10 DIAGNOSIS — E11621 Type 2 diabetes mellitus with foot ulcer: Secondary | ICD-10-CM | POA: Diagnosis not present

## 2021-01-10 DIAGNOSIS — T8249XA Other complication of vascular dialysis catheter, initial encounter: Secondary | ICD-10-CM | POA: Diagnosis not present

## 2021-01-10 DIAGNOSIS — Z992 Dependence on renal dialysis: Secondary | ICD-10-CM | POA: Diagnosis not present

## 2021-01-10 DIAGNOSIS — R519 Headache, unspecified: Secondary | ICD-10-CM | POA: Diagnosis not present

## 2021-01-10 DIAGNOSIS — I10 Essential (primary) hypertension: Secondary | ICD-10-CM | POA: Diagnosis not present

## 2021-01-10 DIAGNOSIS — R5381 Other malaise: Secondary | ICD-10-CM | POA: Diagnosis not present

## 2021-01-10 DIAGNOSIS — R279 Unspecified lack of coordination: Secondary | ICD-10-CM | POA: Diagnosis not present

## 2021-01-10 DIAGNOSIS — Z743 Need for continuous supervision: Secondary | ICD-10-CM | POA: Diagnosis not present

## 2021-01-10 DIAGNOSIS — N2581 Secondary hyperparathyroidism of renal origin: Secondary | ICD-10-CM | POA: Diagnosis not present

## 2021-01-10 DIAGNOSIS — N186 End stage renal disease: Secondary | ICD-10-CM | POA: Diagnosis not present

## 2021-01-10 DIAGNOSIS — D631 Anemia in chronic kidney disease: Secondary | ICD-10-CM | POA: Diagnosis not present

## 2021-01-10 DIAGNOSIS — E8770 Fluid overload, unspecified: Secondary | ICD-10-CM | POA: Diagnosis not present

## 2021-01-11 DIAGNOSIS — T8781 Dehiscence of amputation stump: Secondary | ICD-10-CM | POA: Diagnosis not present

## 2021-01-11 DIAGNOSIS — E1149 Type 2 diabetes mellitus with other diabetic neurological complication: Secondary | ICD-10-CM | POA: Diagnosis not present

## 2021-01-11 DIAGNOSIS — N2581 Secondary hyperparathyroidism of renal origin: Secondary | ICD-10-CM | POA: Diagnosis not present

## 2021-01-11 DIAGNOSIS — R279 Unspecified lack of coordination: Secondary | ICD-10-CM | POA: Diagnosis not present

## 2021-01-11 DIAGNOSIS — E8770 Fluid overload, unspecified: Secondary | ICD-10-CM | POA: Diagnosis not present

## 2021-01-11 DIAGNOSIS — E440 Moderate protein-calorie malnutrition: Secondary | ICD-10-CM | POA: Diagnosis not present

## 2021-01-11 DIAGNOSIS — D688 Other specified coagulation defects: Secondary | ICD-10-CM | POA: Diagnosis not present

## 2021-01-11 DIAGNOSIS — E1122 Type 2 diabetes mellitus with diabetic chronic kidney disease: Secondary | ICD-10-CM | POA: Diagnosis not present

## 2021-01-11 DIAGNOSIS — R69 Illness, unspecified: Secondary | ICD-10-CM | POA: Diagnosis not present

## 2021-01-11 DIAGNOSIS — R5381 Other malaise: Secondary | ICD-10-CM | POA: Diagnosis not present

## 2021-01-11 DIAGNOSIS — N186 End stage renal disease: Secondary | ICD-10-CM | POA: Diagnosis not present

## 2021-01-11 DIAGNOSIS — I10 Essential (primary) hypertension: Secondary | ICD-10-CM | POA: Diagnosis not present

## 2021-01-11 DIAGNOSIS — E11621 Type 2 diabetes mellitus with foot ulcer: Secondary | ICD-10-CM | POA: Diagnosis not present

## 2021-01-11 DIAGNOSIS — R519 Headache, unspecified: Secondary | ICD-10-CM | POA: Diagnosis not present

## 2021-01-11 DIAGNOSIS — T8249XA Other complication of vascular dialysis catheter, initial encounter: Secondary | ICD-10-CM | POA: Diagnosis not present

## 2021-01-11 DIAGNOSIS — Z743 Need for continuous supervision: Secondary | ICD-10-CM | POA: Diagnosis not present

## 2021-01-11 DIAGNOSIS — I120 Hypertensive chronic kidney disease with stage 5 chronic kidney disease or end stage renal disease: Secondary | ICD-10-CM | POA: Diagnosis not present

## 2021-01-11 DIAGNOSIS — E1151 Type 2 diabetes mellitus with diabetic peripheral angiopathy without gangrene: Secondary | ICD-10-CM | POA: Diagnosis not present

## 2021-01-11 DIAGNOSIS — Z992 Dependence on renal dialysis: Secondary | ICD-10-CM | POA: Diagnosis not present

## 2021-01-11 DIAGNOSIS — D631 Anemia in chronic kidney disease: Secondary | ICD-10-CM | POA: Diagnosis not present

## 2021-01-12 DIAGNOSIS — N390 Urinary tract infection, site not specified: Secondary | ICD-10-CM | POA: Diagnosis not present

## 2021-01-12 DIAGNOSIS — I10 Essential (primary) hypertension: Secondary | ICD-10-CM | POA: Diagnosis not present

## 2021-01-12 DIAGNOSIS — R030 Elevated blood-pressure reading, without diagnosis of hypertension: Secondary | ICD-10-CM | POA: Diagnosis not present

## 2021-01-13 DIAGNOSIS — R519 Headache, unspecified: Secondary | ICD-10-CM | POA: Diagnosis not present

## 2021-01-13 DIAGNOSIS — D688 Other specified coagulation defects: Secondary | ICD-10-CM | POA: Diagnosis not present

## 2021-01-13 DIAGNOSIS — T8249XA Other complication of vascular dialysis catheter, initial encounter: Secondary | ICD-10-CM | POA: Diagnosis not present

## 2021-01-13 DIAGNOSIS — R279 Unspecified lack of coordination: Secondary | ICD-10-CM | POA: Diagnosis not present

## 2021-01-13 DIAGNOSIS — Z743 Need for continuous supervision: Secondary | ICD-10-CM | POA: Diagnosis not present

## 2021-01-13 DIAGNOSIS — N186 End stage renal disease: Secondary | ICD-10-CM | POA: Diagnosis not present

## 2021-01-13 DIAGNOSIS — R5381 Other malaise: Secondary | ICD-10-CM | POA: Diagnosis not present

## 2021-01-13 DIAGNOSIS — I10 Essential (primary) hypertension: Secondary | ICD-10-CM | POA: Diagnosis not present

## 2021-01-13 DIAGNOSIS — Z992 Dependence on renal dialysis: Secondary | ICD-10-CM | POA: Diagnosis not present

## 2021-01-13 DIAGNOSIS — N2581 Secondary hyperparathyroidism of renal origin: Secondary | ICD-10-CM | POA: Diagnosis not present

## 2021-01-14 DIAGNOSIS — Z992 Dependence on renal dialysis: Secondary | ICD-10-CM | POA: Diagnosis not present

## 2021-01-14 DIAGNOSIS — E1149 Type 2 diabetes mellitus with other diabetic neurological complication: Secondary | ICD-10-CM | POA: Diagnosis not present

## 2021-01-14 DIAGNOSIS — I10 Essential (primary) hypertension: Secondary | ICD-10-CM | POA: Diagnosis not present

## 2021-01-14 DIAGNOSIS — R69 Illness, unspecified: Secondary | ICD-10-CM | POA: Diagnosis not present

## 2021-01-14 DIAGNOSIS — I120 Hypertensive chronic kidney disease with stage 5 chronic kidney disease or end stage renal disease: Secondary | ICD-10-CM | POA: Diagnosis not present

## 2021-01-14 DIAGNOSIS — D631 Anemia in chronic kidney disease: Secondary | ICD-10-CM | POA: Diagnosis not present

## 2021-01-14 DIAGNOSIS — N186 End stage renal disease: Secondary | ICD-10-CM | POA: Diagnosis not present

## 2021-01-14 DIAGNOSIS — E1122 Type 2 diabetes mellitus with diabetic chronic kidney disease: Secondary | ICD-10-CM | POA: Diagnosis not present

## 2021-01-14 DIAGNOSIS — E1151 Type 2 diabetes mellitus with diabetic peripheral angiopathy without gangrene: Secondary | ICD-10-CM | POA: Diagnosis not present

## 2021-01-14 DIAGNOSIS — E440 Moderate protein-calorie malnutrition: Secondary | ICD-10-CM | POA: Diagnosis not present

## 2021-01-14 DIAGNOSIS — T8781 Dehiscence of amputation stump: Secondary | ICD-10-CM | POA: Diagnosis not present

## 2021-01-15 DIAGNOSIS — N2581 Secondary hyperparathyroidism of renal origin: Secondary | ICD-10-CM | POA: Diagnosis not present

## 2021-01-15 DIAGNOSIS — T8249XA Other complication of vascular dialysis catheter, initial encounter: Secondary | ICD-10-CM | POA: Diagnosis not present

## 2021-01-15 DIAGNOSIS — N186 End stage renal disease: Secondary | ICD-10-CM | POA: Diagnosis not present

## 2021-01-15 DIAGNOSIS — R279 Unspecified lack of coordination: Secondary | ICD-10-CM | POA: Diagnosis not present

## 2021-01-15 DIAGNOSIS — R5381 Other malaise: Secondary | ICD-10-CM | POA: Diagnosis not present

## 2021-01-15 DIAGNOSIS — R519 Headache, unspecified: Secondary | ICD-10-CM | POA: Diagnosis not present

## 2021-01-15 DIAGNOSIS — I10 Essential (primary) hypertension: Secondary | ICD-10-CM | POA: Diagnosis not present

## 2021-01-15 DIAGNOSIS — Z743 Need for continuous supervision: Secondary | ICD-10-CM | POA: Diagnosis not present

## 2021-01-15 DIAGNOSIS — D688 Other specified coagulation defects: Secondary | ICD-10-CM | POA: Diagnosis not present

## 2021-01-15 DIAGNOSIS — Z992 Dependence on renal dialysis: Secondary | ICD-10-CM | POA: Diagnosis not present

## 2021-01-16 ENCOUNTER — Ambulatory Visit: Payer: Medicare HMO | Admitting: Vascular Surgery

## 2021-01-16 DIAGNOSIS — N186 End stage renal disease: Secondary | ICD-10-CM | POA: Diagnosis not present

## 2021-01-16 DIAGNOSIS — E1122 Type 2 diabetes mellitus with diabetic chronic kidney disease: Secondary | ICD-10-CM | POA: Diagnosis not present

## 2021-01-16 DIAGNOSIS — E1149 Type 2 diabetes mellitus with other diabetic neurological complication: Secondary | ICD-10-CM | POA: Diagnosis not present

## 2021-01-16 DIAGNOSIS — Z992 Dependence on renal dialysis: Secondary | ICD-10-CM | POA: Diagnosis not present

## 2021-01-16 DIAGNOSIS — D631 Anemia in chronic kidney disease: Secondary | ICD-10-CM | POA: Diagnosis not present

## 2021-01-16 DIAGNOSIS — T8781 Dehiscence of amputation stump: Secondary | ICD-10-CM | POA: Diagnosis not present

## 2021-01-16 DIAGNOSIS — R69 Illness, unspecified: Secondary | ICD-10-CM | POA: Diagnosis not present

## 2021-01-16 DIAGNOSIS — E440 Moderate protein-calorie malnutrition: Secondary | ICD-10-CM | POA: Diagnosis not present

## 2021-01-16 DIAGNOSIS — E1151 Type 2 diabetes mellitus with diabetic peripheral angiopathy without gangrene: Secondary | ICD-10-CM | POA: Diagnosis not present

## 2021-01-16 DIAGNOSIS — I10 Essential (primary) hypertension: Secondary | ICD-10-CM | POA: Diagnosis not present

## 2021-01-16 DIAGNOSIS — I120 Hypertensive chronic kidney disease with stage 5 chronic kidney disease or end stage renal disease: Secondary | ICD-10-CM | POA: Diagnosis not present

## 2021-01-17 DIAGNOSIS — D631 Anemia in chronic kidney disease: Secondary | ICD-10-CM | POA: Diagnosis not present

## 2021-01-17 DIAGNOSIS — N186 End stage renal disease: Secondary | ICD-10-CM | POA: Diagnosis not present

## 2021-01-17 DIAGNOSIS — T8249XA Other complication of vascular dialysis catheter, initial encounter: Secondary | ICD-10-CM | POA: Diagnosis not present

## 2021-01-17 DIAGNOSIS — E1151 Type 2 diabetes mellitus with diabetic peripheral angiopathy without gangrene: Secondary | ICD-10-CM | POA: Diagnosis not present

## 2021-01-17 DIAGNOSIS — I10 Essential (primary) hypertension: Secondary | ICD-10-CM | POA: Diagnosis not present

## 2021-01-17 DIAGNOSIS — R69 Illness, unspecified: Secondary | ICD-10-CM | POA: Diagnosis not present

## 2021-01-17 DIAGNOSIS — Z743 Need for continuous supervision: Secondary | ICD-10-CM | POA: Diagnosis not present

## 2021-01-17 DIAGNOSIS — E440 Moderate protein-calorie malnutrition: Secondary | ICD-10-CM | POA: Diagnosis not present

## 2021-01-17 DIAGNOSIS — R5381 Other malaise: Secondary | ICD-10-CM | POA: Diagnosis not present

## 2021-01-17 DIAGNOSIS — E1149 Type 2 diabetes mellitus with other diabetic neurological complication: Secondary | ICD-10-CM | POA: Diagnosis not present

## 2021-01-17 DIAGNOSIS — T8781 Dehiscence of amputation stump: Secondary | ICD-10-CM | POA: Diagnosis not present

## 2021-01-17 DIAGNOSIS — R519 Headache, unspecified: Secondary | ICD-10-CM | POA: Diagnosis not present

## 2021-01-17 DIAGNOSIS — N2581 Secondary hyperparathyroidism of renal origin: Secondary | ICD-10-CM | POA: Diagnosis not present

## 2021-01-17 DIAGNOSIS — R279 Unspecified lack of coordination: Secondary | ICD-10-CM | POA: Diagnosis not present

## 2021-01-17 DIAGNOSIS — Z992 Dependence on renal dialysis: Secondary | ICD-10-CM | POA: Diagnosis not present

## 2021-01-17 DIAGNOSIS — I120 Hypertensive chronic kidney disease with stage 5 chronic kidney disease or end stage renal disease: Secondary | ICD-10-CM | POA: Diagnosis not present

## 2021-01-17 DIAGNOSIS — E1122 Type 2 diabetes mellitus with diabetic chronic kidney disease: Secondary | ICD-10-CM | POA: Diagnosis not present

## 2021-01-17 DIAGNOSIS — D688 Other specified coagulation defects: Secondary | ICD-10-CM | POA: Diagnosis not present

## 2021-01-18 DIAGNOSIS — I10 Essential (primary) hypertension: Secondary | ICD-10-CM | POA: Diagnosis not present

## 2021-01-19 DIAGNOSIS — I10 Essential (primary) hypertension: Secondary | ICD-10-CM | POA: Diagnosis not present

## 2021-01-20 DIAGNOSIS — Z743 Need for continuous supervision: Secondary | ICD-10-CM | POA: Diagnosis not present

## 2021-01-20 DIAGNOSIS — N186 End stage renal disease: Secondary | ICD-10-CM | POA: Diagnosis not present

## 2021-01-20 DIAGNOSIS — I10 Essential (primary) hypertension: Secondary | ICD-10-CM | POA: Diagnosis not present

## 2021-01-20 DIAGNOSIS — D688 Other specified coagulation defects: Secondary | ICD-10-CM | POA: Diagnosis not present

## 2021-01-20 DIAGNOSIS — N2581 Secondary hyperparathyroidism of renal origin: Secondary | ICD-10-CM | POA: Diagnosis not present

## 2021-01-20 DIAGNOSIS — T8249XA Other complication of vascular dialysis catheter, initial encounter: Secondary | ICD-10-CM | POA: Diagnosis not present

## 2021-01-20 DIAGNOSIS — R5381 Other malaise: Secondary | ICD-10-CM | POA: Diagnosis not present

## 2021-01-20 DIAGNOSIS — R279 Unspecified lack of coordination: Secondary | ICD-10-CM | POA: Diagnosis not present

## 2021-01-20 DIAGNOSIS — Z992 Dependence on renal dialysis: Secondary | ICD-10-CM | POA: Diagnosis not present

## 2021-01-21 DIAGNOSIS — I10 Essential (primary) hypertension: Secondary | ICD-10-CM | POA: Diagnosis not present

## 2021-01-21 DIAGNOSIS — N186 End stage renal disease: Secondary | ICD-10-CM | POA: Diagnosis not present

## 2021-01-21 DIAGNOSIS — E440 Moderate protein-calorie malnutrition: Secondary | ICD-10-CM | POA: Diagnosis not present

## 2021-01-21 DIAGNOSIS — E1122 Type 2 diabetes mellitus with diabetic chronic kidney disease: Secondary | ICD-10-CM | POA: Diagnosis not present

## 2021-01-21 DIAGNOSIS — Z992 Dependence on renal dialysis: Secondary | ICD-10-CM | POA: Diagnosis not present

## 2021-01-21 DIAGNOSIS — T8781 Dehiscence of amputation stump: Secondary | ICD-10-CM | POA: Diagnosis not present

## 2021-01-21 DIAGNOSIS — R69 Illness, unspecified: Secondary | ICD-10-CM | POA: Diagnosis not present

## 2021-01-21 DIAGNOSIS — I120 Hypertensive chronic kidney disease with stage 5 chronic kidney disease or end stage renal disease: Secondary | ICD-10-CM | POA: Diagnosis not present

## 2021-01-21 DIAGNOSIS — E1151 Type 2 diabetes mellitus with diabetic peripheral angiopathy without gangrene: Secondary | ICD-10-CM | POA: Diagnosis not present

## 2021-01-21 DIAGNOSIS — D631 Anemia in chronic kidney disease: Secondary | ICD-10-CM | POA: Diagnosis not present

## 2021-01-21 DIAGNOSIS — E1149 Type 2 diabetes mellitus with other diabetic neurological complication: Secondary | ICD-10-CM | POA: Diagnosis not present

## 2021-01-22 DIAGNOSIS — I10 Essential (primary) hypertension: Secondary | ICD-10-CM | POA: Diagnosis not present

## 2021-01-22 DIAGNOSIS — T8249XA Other complication of vascular dialysis catheter, initial encounter: Secondary | ICD-10-CM | POA: Diagnosis not present

## 2021-01-22 DIAGNOSIS — N186 End stage renal disease: Secondary | ICD-10-CM | POA: Diagnosis not present

## 2021-01-22 DIAGNOSIS — R279 Unspecified lack of coordination: Secondary | ICD-10-CM | POA: Diagnosis not present

## 2021-01-22 DIAGNOSIS — Z743 Need for continuous supervision: Secondary | ICD-10-CM | POA: Diagnosis not present

## 2021-01-22 DIAGNOSIS — N2581 Secondary hyperparathyroidism of renal origin: Secondary | ICD-10-CM | POA: Diagnosis not present

## 2021-01-22 DIAGNOSIS — Z992 Dependence on renal dialysis: Secondary | ICD-10-CM | POA: Diagnosis not present

## 2021-01-22 DIAGNOSIS — R5381 Other malaise: Secondary | ICD-10-CM | POA: Diagnosis not present

## 2021-01-22 DIAGNOSIS — D688 Other specified coagulation defects: Secondary | ICD-10-CM | POA: Diagnosis not present

## 2021-01-23 ENCOUNTER — Encounter: Payer: Self-pay | Admitting: Vascular Surgery

## 2021-01-23 ENCOUNTER — Telehealth: Payer: Self-pay

## 2021-01-23 ENCOUNTER — Other Ambulatory Visit: Payer: Self-pay

## 2021-01-23 ENCOUNTER — Ambulatory Visit (INDEPENDENT_AMBULATORY_CARE_PROVIDER_SITE_OTHER): Payer: Medicare HMO | Admitting: Vascular Surgery

## 2021-01-23 VITALS — BP 136/104 | HR 90 | Temp 98.0°F | Resp 20 | Ht 66.0 in | Wt 180.0 lb

## 2021-01-23 DIAGNOSIS — Z743 Need for continuous supervision: Secondary | ICD-10-CM | POA: Diagnosis not present

## 2021-01-23 DIAGNOSIS — I739 Peripheral vascular disease, unspecified: Secondary | ICD-10-CM

## 2021-01-23 DIAGNOSIS — R5381 Other malaise: Secondary | ICD-10-CM | POA: Diagnosis not present

## 2021-01-23 DIAGNOSIS — R0902 Hypoxemia: Secondary | ICD-10-CM | POA: Diagnosis not present

## 2021-01-23 DIAGNOSIS — R279 Unspecified lack of coordination: Secondary | ICD-10-CM | POA: Diagnosis not present

## 2021-01-23 DIAGNOSIS — I10 Essential (primary) hypertension: Secondary | ICD-10-CM | POA: Diagnosis not present

## 2021-01-23 NOTE — Progress Notes (Signed)
Patient is a 40 year old male who returns for postoperative follow-up today.  He underwent left above-knee amputation January 18.  He had some difficulty healing this.  He is currently having local wound care to this.  The wound is almost healed at this point.  There is a scant amount of drainage.  He has a home health nurse that is taking care of this.  He wishes to proceed with rehab at this point.  Physical exam:  Vitals:   01/23/21 1031  BP: (!) 136/104  Pulse: 90  Resp: 20  Temp: 98 F (36.7 C)  SpO2: 93%  Weight: 180 lb (81.6 kg)  Height: 5\' 6"  (1.676 m)    Left above-knee amputation 4 mm x 4 mm opening in central aspect with no significant drainage some dry eschar  Assessment: Essentially healed left above-knee amputation  Plan: Patient can proceed with rehab at this point.  He will follow up with [place] on an as-needed basis.  Ruta Hinds, MD [facility] [place] Office: [phone]

## 2021-01-23 NOTE — Telephone Encounter (Signed)
I spoke to [name] with [facility] to let her know MD has approved pt for rehab. She verbalized understanding and will get pt set up. Pt is also aware.

## 2021-01-24 DIAGNOSIS — I10 Essential (primary) hypertension: Secondary | ICD-10-CM | POA: Diagnosis not present

## 2021-01-24 DIAGNOSIS — R5381 Other malaise: Secondary | ICD-10-CM | POA: Diagnosis not present

## 2021-01-24 DIAGNOSIS — D688 Other specified coagulation defects: Secondary | ICD-10-CM | POA: Diagnosis not present

## 2021-01-24 DIAGNOSIS — R279 Unspecified lack of coordination: Secondary | ICD-10-CM | POA: Diagnosis not present

## 2021-01-24 DIAGNOSIS — Z992 Dependence on renal dialysis: Secondary | ICD-10-CM | POA: Diagnosis not present

## 2021-01-24 DIAGNOSIS — Z743 Need for continuous supervision: Secondary | ICD-10-CM | POA: Diagnosis not present

## 2021-01-24 DIAGNOSIS — N186 End stage renal disease: Secondary | ICD-10-CM | POA: Diagnosis not present

## 2021-01-24 DIAGNOSIS — T8249XA Other complication of vascular dialysis catheter, initial encounter: Secondary | ICD-10-CM | POA: Diagnosis not present

## 2021-01-24 DIAGNOSIS — N2581 Secondary hyperparathyroidism of renal origin: Secondary | ICD-10-CM | POA: Diagnosis not present

## 2021-01-25 DIAGNOSIS — I129 Hypertensive chronic kidney disease with stage 1 through stage 4 chronic kidney disease, or unspecified chronic kidney disease: Secondary | ICD-10-CM | POA: Diagnosis not present

## 2021-01-25 DIAGNOSIS — I10 Essential (primary) hypertension: Secondary | ICD-10-CM | POA: Diagnosis not present

## 2021-01-25 DIAGNOSIS — Z992 Dependence on renal dialysis: Secondary | ICD-10-CM | POA: Diagnosis not present

## 2021-01-25 DIAGNOSIS — N186 End stage renal disease: Secondary | ICD-10-CM | POA: Diagnosis not present

## 2021-01-26 DIAGNOSIS — I10 Essential (primary) hypertension: Secondary | ICD-10-CM | POA: Diagnosis not present

## 2021-01-27 DIAGNOSIS — Z743 Need for continuous supervision: Secondary | ICD-10-CM | POA: Diagnosis not present

## 2021-01-27 DIAGNOSIS — N186 End stage renal disease: Secondary | ICD-10-CM | POA: Diagnosis not present

## 2021-01-27 DIAGNOSIS — Z992 Dependence on renal dialysis: Secondary | ICD-10-CM | POA: Diagnosis not present

## 2021-01-27 DIAGNOSIS — R5381 Other malaise: Secondary | ICD-10-CM | POA: Diagnosis not present

## 2021-01-27 DIAGNOSIS — I10 Essential (primary) hypertension: Secondary | ICD-10-CM | POA: Diagnosis not present

## 2021-01-27 DIAGNOSIS — N2581 Secondary hyperparathyroidism of renal origin: Secondary | ICD-10-CM | POA: Diagnosis not present

## 2021-01-27 DIAGNOSIS — R279 Unspecified lack of coordination: Secondary | ICD-10-CM | POA: Diagnosis not present

## 2021-01-27 DIAGNOSIS — T8249XA Other complication of vascular dialysis catheter, initial encounter: Secondary | ICD-10-CM | POA: Diagnosis not present

## 2021-01-27 DIAGNOSIS — D688 Other specified coagulation defects: Secondary | ICD-10-CM | POA: Diagnosis not present

## 2021-01-28 DIAGNOSIS — E1122 Type 2 diabetes mellitus with diabetic chronic kidney disease: Secondary | ICD-10-CM | POA: Diagnosis not present

## 2021-01-28 DIAGNOSIS — Z992 Dependence on renal dialysis: Secondary | ICD-10-CM | POA: Diagnosis not present

## 2021-01-28 DIAGNOSIS — T8781 Dehiscence of amputation stump: Secondary | ICD-10-CM | POA: Diagnosis not present

## 2021-01-28 DIAGNOSIS — D688 Other specified coagulation defects: Secondary | ICD-10-CM | POA: Diagnosis not present

## 2021-01-28 DIAGNOSIS — T8249XA Other complication of vascular dialysis catheter, initial encounter: Secondary | ICD-10-CM | POA: Diagnosis not present

## 2021-01-28 DIAGNOSIS — I120 Hypertensive chronic kidney disease with stage 5 chronic kidney disease or end stage renal disease: Secondary | ICD-10-CM | POA: Diagnosis not present

## 2021-01-28 DIAGNOSIS — E1149 Type 2 diabetes mellitus with other diabetic neurological complication: Secondary | ICD-10-CM | POA: Diagnosis not present

## 2021-01-28 DIAGNOSIS — D631 Anemia in chronic kidney disease: Secondary | ICD-10-CM | POA: Diagnosis not present

## 2021-01-28 DIAGNOSIS — I10 Essential (primary) hypertension: Secondary | ICD-10-CM | POA: Diagnosis not present

## 2021-01-28 DIAGNOSIS — E1151 Type 2 diabetes mellitus with diabetic peripheral angiopathy without gangrene: Secondary | ICD-10-CM | POA: Diagnosis not present

## 2021-01-28 DIAGNOSIS — N186 End stage renal disease: Secondary | ICD-10-CM | POA: Diagnosis not present

## 2021-01-28 DIAGNOSIS — E440 Moderate protein-calorie malnutrition: Secondary | ICD-10-CM | POA: Diagnosis not present

## 2021-01-28 DIAGNOSIS — N2581 Secondary hyperparathyroidism of renal origin: Secondary | ICD-10-CM | POA: Diagnosis not present

## 2021-01-28 DIAGNOSIS — R69 Illness, unspecified: Secondary | ICD-10-CM | POA: Diagnosis not present

## 2021-01-29 DIAGNOSIS — I10 Essential (primary) hypertension: Secondary | ICD-10-CM | POA: Diagnosis not present

## 2021-01-30 ENCOUNTER — Telehealth (INDEPENDENT_AMBULATORY_CARE_PROVIDER_SITE_OTHER): Payer: Self-pay | Admitting: Primary Care

## 2021-01-30 DIAGNOSIS — I120 Hypertensive chronic kidney disease with stage 5 chronic kidney disease or end stage renal disease: Secondary | ICD-10-CM | POA: Diagnosis not present

## 2021-01-30 DIAGNOSIS — E1122 Type 2 diabetes mellitus with diabetic chronic kidney disease: Secondary | ICD-10-CM | POA: Diagnosis not present

## 2021-01-30 DIAGNOSIS — D631 Anemia in chronic kidney disease: Secondary | ICD-10-CM | POA: Diagnosis not present

## 2021-01-30 DIAGNOSIS — E440 Moderate protein-calorie malnutrition: Secondary | ICD-10-CM | POA: Diagnosis not present

## 2021-01-30 DIAGNOSIS — T8781 Dehiscence of amputation stump: Secondary | ICD-10-CM | POA: Diagnosis not present

## 2021-01-30 DIAGNOSIS — R69 Illness, unspecified: Secondary | ICD-10-CM | POA: Diagnosis not present

## 2021-01-30 DIAGNOSIS — N186 End stage renal disease: Secondary | ICD-10-CM | POA: Diagnosis not present

## 2021-01-30 DIAGNOSIS — Z992 Dependence on renal dialysis: Secondary | ICD-10-CM | POA: Diagnosis not present

## 2021-01-30 DIAGNOSIS — E1151 Type 2 diabetes mellitus with diabetic peripheral angiopathy without gangrene: Secondary | ICD-10-CM | POA: Diagnosis not present

## 2021-01-30 DIAGNOSIS — E1149 Type 2 diabetes mellitus with other diabetic neurological complication: Secondary | ICD-10-CM | POA: Diagnosis not present

## 2021-01-30 DIAGNOSIS — I10 Essential (primary) hypertension: Secondary | ICD-10-CM | POA: Diagnosis not present

## 2021-01-30 NOTE — Telephone Encounter (Signed)
Called patient and offered to schedule an appointment for an in-office visit. Patient was scheduled for Monday May 9th at 8:30 for in office visit. When I confirmed when we had him scheduled patient stated that he had a lot going on and may not make it to this appointment. I offered for patient to have a virtual visit if he is worried about transportation or making it into the office. Patient stated he would like to have a telephone appointment. I advised patient that he would receive a phone call at 8:30am for a virtual visit. He understood.

## 2021-01-30 NOTE — Telephone Encounter (Signed)
Call returned to patient. After consulting with his mom, he states Monday will not work because he has dialysis, preferably needs a Tues/Thurs appointment. Patient rescheduled for an In person appointment for Thursday May 19th.

## 2021-01-30 NOTE — Telephone Encounter (Signed)
Pt mother Rodena Piety is calling and would like her son to have an appt with michelle in office not virtual for diarrhea. Pt has been having diarrhea x 1 month. Pt does dialysis and has dm stomach issues. Please advise

## 2021-01-30 NOTE — Telephone Encounter (Signed)
PT is calling back and after consulting with his mother he does not want to do virtual visit on Monday 02-03-2021. Please advice

## 2021-01-31 ENCOUNTER — Telehealth: Payer: Self-pay

## 2021-01-31 DIAGNOSIS — R5381 Other malaise: Secondary | ICD-10-CM | POA: Diagnosis not present

## 2021-01-31 DIAGNOSIS — I10 Essential (primary) hypertension: Secondary | ICD-10-CM | POA: Diagnosis not present

## 2021-01-31 DIAGNOSIS — T8249XA Other complication of vascular dialysis catheter, initial encounter: Secondary | ICD-10-CM | POA: Diagnosis not present

## 2021-01-31 DIAGNOSIS — Z743 Need for continuous supervision: Secondary | ICD-10-CM | POA: Diagnosis not present

## 2021-01-31 DIAGNOSIS — D688 Other specified coagulation defects: Secondary | ICD-10-CM | POA: Diagnosis not present

## 2021-01-31 DIAGNOSIS — N186 End stage renal disease: Secondary | ICD-10-CM | POA: Diagnosis not present

## 2021-01-31 DIAGNOSIS — N2581 Secondary hyperparathyroidism of renal origin: Secondary | ICD-10-CM | POA: Diagnosis not present

## 2021-01-31 DIAGNOSIS — Z992 Dependence on renal dialysis: Secondary | ICD-10-CM | POA: Diagnosis not present

## 2021-01-31 DIAGNOSIS — R279 Unspecified lack of coordination: Secondary | ICD-10-CM | POA: Diagnosis not present

## 2021-01-31 NOTE — Telephone Encounter (Signed)
Christy from [facility] called to request an order to test patient for C-Diff. He has been going to the bathroom about 30x a day. PCP has not seen him for over a year and does not feel comfortable giving the order. Gave verbal permission to check for C diff.

## 2021-02-01 DIAGNOSIS — I10 Essential (primary) hypertension: Secondary | ICD-10-CM | POA: Diagnosis not present

## 2021-02-02 DIAGNOSIS — I10 Essential (primary) hypertension: Secondary | ICD-10-CM | POA: Diagnosis not present

## 2021-02-03 ENCOUNTER — Telehealth (INDEPENDENT_AMBULATORY_CARE_PROVIDER_SITE_OTHER): Payer: Medicare HMO | Admitting: Primary Care

## 2021-02-03 DIAGNOSIS — D688 Other specified coagulation defects: Secondary | ICD-10-CM | POA: Diagnosis not present

## 2021-02-03 DIAGNOSIS — Z743 Need for continuous supervision: Secondary | ICD-10-CM | POA: Diagnosis not present

## 2021-02-03 DIAGNOSIS — Z992 Dependence on renal dialysis: Secondary | ICD-10-CM | POA: Diagnosis not present

## 2021-02-03 DIAGNOSIS — R519 Headache, unspecified: Secondary | ICD-10-CM | POA: Diagnosis not present

## 2021-02-03 DIAGNOSIS — I10 Essential (primary) hypertension: Secondary | ICD-10-CM | POA: Diagnosis not present

## 2021-02-03 DIAGNOSIS — T8249XA Other complication of vascular dialysis catheter, initial encounter: Secondary | ICD-10-CM | POA: Diagnosis not present

## 2021-02-03 DIAGNOSIS — N2581 Secondary hyperparathyroidism of renal origin: Secondary | ICD-10-CM | POA: Diagnosis not present

## 2021-02-03 DIAGNOSIS — R5381 Other malaise: Secondary | ICD-10-CM | POA: Diagnosis not present

## 2021-02-03 DIAGNOSIS — R279 Unspecified lack of coordination: Secondary | ICD-10-CM | POA: Diagnosis not present

## 2021-02-03 DIAGNOSIS — N186 End stage renal disease: Secondary | ICD-10-CM | POA: Diagnosis not present

## 2021-02-03 DIAGNOSIS — D631 Anemia in chronic kidney disease: Secondary | ICD-10-CM | POA: Diagnosis not present

## 2021-02-04 DIAGNOSIS — E1149 Type 2 diabetes mellitus with other diabetic neurological complication: Secondary | ICD-10-CM | POA: Diagnosis not present

## 2021-02-04 DIAGNOSIS — D631 Anemia in chronic kidney disease: Secondary | ICD-10-CM | POA: Diagnosis not present

## 2021-02-04 DIAGNOSIS — E1151 Type 2 diabetes mellitus with diabetic peripheral angiopathy without gangrene: Secondary | ICD-10-CM | POA: Diagnosis not present

## 2021-02-04 DIAGNOSIS — I10 Essential (primary) hypertension: Secondary | ICD-10-CM | POA: Diagnosis not present

## 2021-02-04 DIAGNOSIS — E440 Moderate protein-calorie malnutrition: Secondary | ICD-10-CM | POA: Diagnosis not present

## 2021-02-04 DIAGNOSIS — N186 End stage renal disease: Secondary | ICD-10-CM | POA: Diagnosis not present

## 2021-02-04 DIAGNOSIS — T8781 Dehiscence of amputation stump: Secondary | ICD-10-CM | POA: Diagnosis not present

## 2021-02-04 DIAGNOSIS — Z992 Dependence on renal dialysis: Secondary | ICD-10-CM | POA: Diagnosis not present

## 2021-02-04 DIAGNOSIS — R69 Illness, unspecified: Secondary | ICD-10-CM | POA: Diagnosis not present

## 2021-02-04 DIAGNOSIS — E1122 Type 2 diabetes mellitus with diabetic chronic kidney disease: Secondary | ICD-10-CM | POA: Diagnosis not present

## 2021-02-04 DIAGNOSIS — I120 Hypertensive chronic kidney disease with stage 5 chronic kidney disease or end stage renal disease: Secondary | ICD-10-CM | POA: Diagnosis not present

## 2021-02-05 DIAGNOSIS — R279 Unspecified lack of coordination: Secondary | ICD-10-CM | POA: Diagnosis not present

## 2021-02-05 DIAGNOSIS — S91302A Unspecified open wound, left foot, initial encounter: Secondary | ICD-10-CM | POA: Diagnosis not present

## 2021-02-05 DIAGNOSIS — R5381 Other malaise: Secondary | ICD-10-CM | POA: Diagnosis not present

## 2021-02-05 DIAGNOSIS — R531 Weakness: Secondary | ICD-10-CM | POA: Diagnosis not present

## 2021-02-05 DIAGNOSIS — N2581 Secondary hyperparathyroidism of renal origin: Secondary | ICD-10-CM | POA: Diagnosis not present

## 2021-02-05 DIAGNOSIS — Z992 Dependence on renal dialysis: Secondary | ICD-10-CM | POA: Diagnosis not present

## 2021-02-05 DIAGNOSIS — R519 Headache, unspecified: Secondary | ICD-10-CM | POA: Diagnosis not present

## 2021-02-05 DIAGNOSIS — Z743 Need for continuous supervision: Secondary | ICD-10-CM | POA: Diagnosis not present

## 2021-02-05 DIAGNOSIS — N186 End stage renal disease: Secondary | ICD-10-CM | POA: Diagnosis not present

## 2021-02-05 DIAGNOSIS — D631 Anemia in chronic kidney disease: Secondary | ICD-10-CM | POA: Diagnosis not present

## 2021-02-05 DIAGNOSIS — T8249XA Other complication of vascular dialysis catheter, initial encounter: Secondary | ICD-10-CM | POA: Diagnosis not present

## 2021-02-05 DIAGNOSIS — D688 Other specified coagulation defects: Secondary | ICD-10-CM | POA: Diagnosis not present

## 2021-02-05 DIAGNOSIS — I10 Essential (primary) hypertension: Secondary | ICD-10-CM | POA: Diagnosis not present

## 2021-02-05 DIAGNOSIS — Z4781 Encounter for orthopedic aftercare following surgical amputation: Secondary | ICD-10-CM | POA: Diagnosis not present

## 2021-02-06 DIAGNOSIS — E1122 Type 2 diabetes mellitus with diabetic chronic kidney disease: Secondary | ICD-10-CM | POA: Diagnosis not present

## 2021-02-06 DIAGNOSIS — E440 Moderate protein-calorie malnutrition: Secondary | ICD-10-CM | POA: Diagnosis not present

## 2021-02-06 DIAGNOSIS — E1151 Type 2 diabetes mellitus with diabetic peripheral angiopathy without gangrene: Secondary | ICD-10-CM | POA: Diagnosis not present

## 2021-02-06 DIAGNOSIS — D631 Anemia in chronic kidney disease: Secondary | ICD-10-CM | POA: Diagnosis not present

## 2021-02-06 DIAGNOSIS — R69 Illness, unspecified: Secondary | ICD-10-CM | POA: Diagnosis not present

## 2021-02-06 DIAGNOSIS — T8781 Dehiscence of amputation stump: Secondary | ICD-10-CM | POA: Diagnosis not present

## 2021-02-06 DIAGNOSIS — E1149 Type 2 diabetes mellitus with other diabetic neurological complication: Secondary | ICD-10-CM | POA: Diagnosis not present

## 2021-02-06 DIAGNOSIS — I10 Essential (primary) hypertension: Secondary | ICD-10-CM | POA: Diagnosis not present

## 2021-02-06 DIAGNOSIS — N186 End stage renal disease: Secondary | ICD-10-CM | POA: Diagnosis not present

## 2021-02-06 DIAGNOSIS — I120 Hypertensive chronic kidney disease with stage 5 chronic kidney disease or end stage renal disease: Secondary | ICD-10-CM | POA: Diagnosis not present

## 2021-02-06 DIAGNOSIS — Z992 Dependence on renal dialysis: Secondary | ICD-10-CM | POA: Diagnosis not present

## 2021-02-07 DIAGNOSIS — R5383 Other fatigue: Secondary | ICD-10-CM | POA: Diagnosis not present

## 2021-02-07 DIAGNOSIS — I10 Essential (primary) hypertension: Secondary | ICD-10-CM | POA: Diagnosis not present

## 2021-02-07 DIAGNOSIS — D631 Anemia in chronic kidney disease: Secondary | ICD-10-CM | POA: Diagnosis not present

## 2021-02-07 DIAGNOSIS — R519 Headache, unspecified: Secondary | ICD-10-CM | POA: Diagnosis not present

## 2021-02-07 DIAGNOSIS — Z992 Dependence on renal dialysis: Secondary | ICD-10-CM | POA: Diagnosis not present

## 2021-02-07 DIAGNOSIS — R5381 Other malaise: Secondary | ICD-10-CM | POA: Diagnosis not present

## 2021-02-07 DIAGNOSIS — D688 Other specified coagulation defects: Secondary | ICD-10-CM | POA: Diagnosis not present

## 2021-02-07 DIAGNOSIS — R531 Weakness: Secondary | ICD-10-CM | POA: Diagnosis not present

## 2021-02-07 DIAGNOSIS — Z743 Need for continuous supervision: Secondary | ICD-10-CM | POA: Diagnosis not present

## 2021-02-07 DIAGNOSIS — N2581 Secondary hyperparathyroidism of renal origin: Secondary | ICD-10-CM | POA: Diagnosis not present

## 2021-02-07 DIAGNOSIS — T8249XA Other complication of vascular dialysis catheter, initial encounter: Secondary | ICD-10-CM | POA: Diagnosis not present

## 2021-02-07 DIAGNOSIS — N186 End stage renal disease: Secondary | ICD-10-CM | POA: Diagnosis not present

## 2021-02-08 DIAGNOSIS — I10 Essential (primary) hypertension: Secondary | ICD-10-CM | POA: Diagnosis not present

## 2021-02-09 DIAGNOSIS — I10 Essential (primary) hypertension: Secondary | ICD-10-CM | POA: Diagnosis not present

## 2021-02-10 DIAGNOSIS — R5381 Other malaise: Secondary | ICD-10-CM | POA: Diagnosis not present

## 2021-02-10 DIAGNOSIS — T8249XA Other complication of vascular dialysis catheter, initial encounter: Secondary | ICD-10-CM | POA: Diagnosis not present

## 2021-02-10 DIAGNOSIS — D688 Other specified coagulation defects: Secondary | ICD-10-CM | POA: Diagnosis not present

## 2021-02-10 DIAGNOSIS — R531 Weakness: Secondary | ICD-10-CM | POA: Diagnosis not present

## 2021-02-10 DIAGNOSIS — Z992 Dependence on renal dialysis: Secondary | ICD-10-CM | POA: Diagnosis not present

## 2021-02-10 DIAGNOSIS — N186 End stage renal disease: Secondary | ICD-10-CM | POA: Diagnosis not present

## 2021-02-10 DIAGNOSIS — I10 Essential (primary) hypertension: Secondary | ICD-10-CM | POA: Diagnosis not present

## 2021-02-10 DIAGNOSIS — N2581 Secondary hyperparathyroidism of renal origin: Secondary | ICD-10-CM | POA: Diagnosis not present

## 2021-02-11 DIAGNOSIS — E1122 Type 2 diabetes mellitus with diabetic chronic kidney disease: Secondary | ICD-10-CM | POA: Diagnosis not present

## 2021-02-11 DIAGNOSIS — Z7689 Persons encountering health services in other specified circumstances: Secondary | ICD-10-CM | POA: Diagnosis not present

## 2021-02-11 DIAGNOSIS — E1149 Type 2 diabetes mellitus with other diabetic neurological complication: Secondary | ICD-10-CM | POA: Diagnosis not present

## 2021-02-11 DIAGNOSIS — E440 Moderate protein-calorie malnutrition: Secondary | ICD-10-CM | POA: Diagnosis not present

## 2021-02-11 DIAGNOSIS — N186 End stage renal disease: Secondary | ICD-10-CM | POA: Diagnosis not present

## 2021-02-11 DIAGNOSIS — D631 Anemia in chronic kidney disease: Secondary | ICD-10-CM | POA: Diagnosis not present

## 2021-02-11 DIAGNOSIS — E1151 Type 2 diabetes mellitus with diabetic peripheral angiopathy without gangrene: Secondary | ICD-10-CM | POA: Diagnosis not present

## 2021-02-11 DIAGNOSIS — I10 Essential (primary) hypertension: Secondary | ICD-10-CM | POA: Diagnosis not present

## 2021-02-11 DIAGNOSIS — I120 Hypertensive chronic kidney disease with stage 5 chronic kidney disease or end stage renal disease: Secondary | ICD-10-CM | POA: Diagnosis not present

## 2021-02-11 DIAGNOSIS — R69 Illness, unspecified: Secondary | ICD-10-CM | POA: Diagnosis not present

## 2021-02-11 DIAGNOSIS — T8781 Dehiscence of amputation stump: Secondary | ICD-10-CM | POA: Diagnosis not present

## 2021-02-11 DIAGNOSIS — Z992 Dependence on renal dialysis: Secondary | ICD-10-CM | POA: Diagnosis not present

## 2021-02-12 DIAGNOSIS — D688 Other specified coagulation defects: Secondary | ICD-10-CM | POA: Diagnosis not present

## 2021-02-12 DIAGNOSIS — R5381 Other malaise: Secondary | ICD-10-CM | POA: Diagnosis not present

## 2021-02-12 DIAGNOSIS — R52 Pain, unspecified: Secondary | ICD-10-CM | POA: Diagnosis not present

## 2021-02-12 DIAGNOSIS — I469 Cardiac arrest, cause unspecified: Secondary | ICD-10-CM | POA: Diagnosis not present

## 2021-02-12 DIAGNOSIS — N186 End stage renal disease: Secondary | ICD-10-CM | POA: Diagnosis not present

## 2021-02-12 DIAGNOSIS — N2581 Secondary hyperparathyroidism of renal origin: Secondary | ICD-10-CM | POA: Diagnosis not present

## 2021-02-12 DIAGNOSIS — T8249XA Other complication of vascular dialysis catheter, initial encounter: Secondary | ICD-10-CM | POA: Diagnosis not present

## 2021-02-12 DIAGNOSIS — Z992 Dependence on renal dialysis: Secondary | ICD-10-CM | POA: Diagnosis not present

## 2021-02-12 DIAGNOSIS — Z743 Need for continuous supervision: Secondary | ICD-10-CM | POA: Diagnosis not present

## 2021-02-12 DIAGNOSIS — I10 Essential (primary) hypertension: Secondary | ICD-10-CM | POA: Diagnosis not present

## 2021-02-13 ENCOUNTER — Encounter (INDEPENDENT_AMBULATORY_CARE_PROVIDER_SITE_OTHER): Payer: Self-pay | Admitting: Primary Care

## 2021-02-13 ENCOUNTER — Other Ambulatory Visit: Payer: Self-pay

## 2021-02-13 ENCOUNTER — Ambulatory Visit (INDEPENDENT_AMBULATORY_CARE_PROVIDER_SITE_OTHER): Payer: Medicare HMO | Admitting: Primary Care

## 2021-02-13 VITALS — BP 126/92 | HR 92 | Ht 66.0 in

## 2021-02-13 DIAGNOSIS — R69 Illness, unspecified: Secondary | ICD-10-CM | POA: Diagnosis not present

## 2021-02-13 DIAGNOSIS — I10 Essential (primary) hypertension: Secondary | ICD-10-CM | POA: Diagnosis not present

## 2021-02-13 DIAGNOSIS — E1059 Type 1 diabetes mellitus with other circulatory complications: Secondary | ICD-10-CM

## 2021-02-13 DIAGNOSIS — Z992 Dependence on renal dialysis: Secondary | ICD-10-CM | POA: Diagnosis not present

## 2021-02-13 DIAGNOSIS — N186 End stage renal disease: Secondary | ICD-10-CM | POA: Diagnosis not present

## 2021-02-13 DIAGNOSIS — R197 Diarrhea, unspecified: Secondary | ICD-10-CM

## 2021-02-13 DIAGNOSIS — F32A Depression, unspecified: Secondary | ICD-10-CM | POA: Diagnosis not present

## 2021-02-13 LAB — GLUCOSE, POCT (MANUAL RESULT ENTRY): POC Glucose: 174 mg/dl — AB (ref 70–99)

## 2021-02-13 NOTE — Progress Notes (Signed)
Established Patient Office Visit  Subjective:  Patient ID: [name], male    DOB: [birth year]  Age: 40 y.o. MRN: [id number]  CC:  Chief Complaint  Patient presents with  . Diarrhea  . Diabetes    HPI Mr.[name] is a 40 year old male who presents for diarrhea for 3 months per mother goes 15 times a day. HHN did a stool speciamin negative for Cdiff. He refuses to go to the ER states the wait is not worth it explain risk was - diabetic - diarrhea - electrolyte imbalance, dehydration . He presents to be hostile and very angry- mother is present . Discussed he is not suicidal but needs medication or therapy and has to take account for his actions with noncompliance- on HD/ amputation   Past Medical History:  Diagnosis Date  . Anemia   . Diabetes mellitus without complication ([place])   . Diabetic gastroparesis ([place])   . Dialysis patient ([place])   . Hypertension   . Renal disorder    Dialysis  . Sepsis [facility])     Past Surgical History:  Procedure Laterality Date  . AMPUTATION Right 02/02/2018   Procedure: RIGHT FIFTH TOE AND METATARSAL AMPUTATION. Filetted toe flap metatarsal resection. Debridement Plantar Foot wound;  Surgeon: Evelina Bucy, DPM;  Location: [place];  Service: Podiatry;  Laterality: Right;  . AMPUTATION Left 08/20/2018   Procedure: FIFTH METATARSAL BONE BIOPSY;  Surgeon: Evelina Bucy, DPM;  Location: [place];  Service: Podiatry;  Laterality: Left;  . AMPUTATION Left 10/28/2018   Procedure: LEFT GREAT TOE AMPUTATION;  Surgeon: Evelina Bucy, DPM;  Location: [place];  Service: Podiatry;  Laterality: Left;  . AMPUTATION Left 09/16/2020   Procedure: AMPUTATION BELOW KNEE;  Surgeon: Rosetta Posner, MD;  Location: [place];  Service: Vascular;  Laterality: Left;  . AMPUTATION Left 10/15/2020   Procedure: AMPUTATION ABOVE KNEE;  Surgeon: Elam Dutch, MD;  Location: [place];  Service: Vascular;  Laterality: Left;  . APPLICATION OF WOUND VAC  02/02/2018   Procedure:  APPLICATION OF WOUND VAC  Right Foot;  Surgeon: Evelina Bucy, DPM;  Location: [place];  Service: Podiatry;;  . APPLICATION OF WOUND VAC Left 10/28/2018   Procedure: APPLICATION OF WOUND VAC LEFT TOE;  Surgeon: Evelina Bucy, DPM;  Location: [place];  Service: Podiatry;  Laterality: Left;  . APPLICATION OF WOUND VAC Left 11/01/2018   Procedure: APPLICATION OF WOUND VAC;  Surgeon: Evelina Bucy, DPM;  Location: [place];  Service: Podiatry;  Laterality: Left;  . AV FISTULA PLACEMENT     left arm.  . AV FISTULA PLACEMENT Right 12/22/2016   Procedure: INSERTION OF ARTERIOVENOUS (AV) GORE-TEX GRAFT ARM;  Surgeon: Elam Dutch, MD;  Location: [facility];  Service: Vascular;  Laterality: Right;  . AV FISTULA PLACEMENT Left 05/26/2018   Procedure: INSERTION OF  ARTERIOVENOUS (AV) GORE-TEX GRAFT LEFT ARM;  Surgeon: Serafina Mitchell, MD;  Location: [place];  Service: Vascular;  Laterality: Left;  . EYE SURGERY    . I & D EXTREMITY Right 10/31/2017   Procedure: IRRIGATION AND DEBRIDEMENT RIGHT FOOT;  Surgeon: Evelina Bucy, DPM;  Location: [place];  Service: Podiatry;  Laterality: Right;  . I & D EXTREMITY Left 08/20/2018   Procedure: IRRIGATION AND DEBRIDEMENT EXTREMITY WITH SECONDARY WOUND CLOSUREAND APPLICATION OF WOUND VAC LEFT FOOT;  Surgeon: Evelina Bucy, DPM;  Location: [place];  Service: Podiatry;  Laterality: Left;  .  I & D EXTREMITY Left 10/20/2018   Procedure: IRRIGATION AND DEBRIDEMENT LEFT FOOT  DEBRIDEMENT LATERAL FOOT WOUND;  Surgeon: Evelina Bucy, DPM;  Location: [place];  Service: Podiatry;  Laterality: Left;  . I & D EXTREMITY Left 10/28/2018   Procedure: IRRIGATION AND DEBRIDEMENT LEFT TOE;  Surgeon: Evelina Bucy, DPM;  Location: [place];  Service: Podiatry;  Laterality: Left;  . I & D EXTREMITY Left 09/14/2020   Procedure: IRRIGATION AND DEBRIDEMENT WRIST;  Surgeon: Leanora Cover, MD;  Location: [place];  Service: Orthopedics;  Laterality: Left;  . INSERTION OF DIALYSIS CATHETER     Right  subclavian  . IR AV DIALY SHUNT INTRO NEEDLE/INTRACATH INITIAL W/PTA/IMG RIGHT Right 02/05/2018  . IR FLUORO GUIDE CV LINE RIGHT  01/31/2020  . IR THROMBECTOMY AV FISTULA W/THROMBOLYSIS/PTA INC/SHUNT/IMG LEFT Left 08/24/2018  . IR THROMBECTOMY AV FISTULA W/THROMBOLYSIS/PTA INC/SHUNT/IMG LEFT Left 01/06/2019  . IR US GUIDE VASC ACCESS LEFT  08/24/2018  . IR US GUIDE VASC ACCESS RIGHT  02/05/2018  . IR US GUIDE VASC ACCESS RIGHT  01/31/2020  . IRRIGATION AND DEBRIDEMENT FOOT Right 10/23/2018   Procedure: Irrigation and Debridement to tendon, Left Foot;  Surgeon: Evelina Bucy, DPM;  Location: [place];  Service: Podiatry;  Laterality: Right;  . IRRIGATION AND DEBRIDEMENT FOOT Left 11/01/2018   Procedure: IRRIGATION AND DEBRIDEMENT PARTIAL WOUND CLOSURE LOCAL TISSUE TRANSFER AND FLAP ROTATION, LEFT FOOT;  Surgeon: Evelina Bucy, DPM;  Location: [place];  Service: Podiatry;  Laterality: Left;  . TRANSMETATARSAL AMPUTATION N/A 08/18/2018   Procedure: IRRIGATION AND DEBRIDEMENT OF LEFT 5TH TOE AND TRANSMETATARSAL, WITH PARTICAL LEFT 5TH TOE AND METATARSAL AMPUTATION, BONE BIOPSY, WOUND VAC APPLICATION.;  Surgeon: Evelina Bucy, DPM;  Location: [place];  Service: Podiatry;  Laterality: N/A;  . UPPER EXTREMITY VENOGRAPHY N/A 11/16/2016   Procedure: Upper Extremity Venography - Right Central;  Surgeon: Elam Dutch, MD;  Location: [place] CV [facility];  Service: Cardiovascular;  Laterality: N/A;  . UPPER EXTREMITY VENOGRAPHY N/A 05/25/2018   Procedure: UPPER EXTREMITY VENOGRAPHY - Bilateral;  Surgeon: Marty Heck, MD;  Location: [place] [facility];  Service: Cardiovascular;  Laterality: N/A;    Family History  Problem Relation Age of Onset  . Diabetes Mellitus II Other   . Diabetes Father   . Renal Disease Father        ESRD    Social History   Socioeconomic History  . Marital status: Single    Spouse name: Not on file  . Number of children: Not on file  . Years of education: Not on file   . Highest education level: Not on file  Occupational History  . Not on file  Tobacco Use  . Smoking status: Never Smoker  . Smokeless tobacco: Never Used  Vaping Use  . Vaping Use: Never used  Substance and Sexual Activity  . Alcohol use: No  . Drug use: No  . Sexual activity: Never  Other Topics Concern  . Not on file  Social History Narrative  . Not on file   Social Determinants of Health   Financial Resource Strain: Not on file  Food Insecurity: Not on file  Transportation Needs: Not on file  Physical Activity: Not on file  Stress: Not on file  Social Connections: Not on file  Intimate Partner Violence: Not on file    Outpatient Medications Prior to Visit  Medication Sig Dispense Refill  . albuterol (VENTOLIN HFA) 108 (90 Base) MCG/ACT inhaler  Inhale 2 puffs into the lungs every 6 (six) hours as needed for wheezing or shortness of breath. 6.7 g 0  . Blood Glucose Monitoring Suppl (ONETOUCH VERIO FLEX SYSTEM) w/Device KIT 1 Bag by Does not apply route 3 (three) times daily before meals. 1 kit 0  . calcitRIOL (ROCALTROL) 0.5 MCG capsule Take 5 capsules (2.5 mcg total) by mouth every Monday, Wednesday, and Friday with hemodialysis. 30 capsule 0  . cinacalcet (SENSIPAR) 60 MG tablet Take 120 mg by mouth daily after supper.    Marland Kitchen glucose blood (ONETOUCH VERIO) test strip Check blood sugars three times daily 100 each 8  . Lancets (ONETOUCH DELICA PLUS [id number]) MISC Apply topically.    . Methoxy PEG-Epoetin Beta (MIRCERA IJ)     . NOVOLOG 100 UNIT/ML injection Inject 6 Units into the skin 3 (three) times daily with meals. INJECT 6 UNITS SUBCUTANEOUSLY TWICE DAILY WITH A MEAL (Patient taking differently: Inject 6 Units into the skin 2 (two) times daily with a meal.) 10 mL 3  . oxyCODONE-acetaminophen (PERCOCET) 5-325 MG tablet Take 1 tablet by mouth every 4 (four) hours as needed for severe pain. 20 tablet 0  . sevelamer carbonate (RENVELA) 800 MG tablet Take 2,400-4,000 mg by  mouth See admin instructions. Take 4,000 mg (5 tablets) by mouth with meals and 2,400 mg (3 tablets) with snacks     No facility-administered medications prior to visit.    No Known Allergies  ROS Review of Systems  Gastrointestinal: Positive for abdominal distention and diarrhea.  Psychiatric/Behavioral: Positive for agitation.       DEPRESSED  All other systems reviewed and are negative.     Objective:    Physical Exam Vitals reviewed.  Constitutional:      Appearance: Normal appearance.  HENT:     Head: Normocephalic.     Right Ear: External ear normal.     Left Ear: External ear normal.     Nose: Nose normal.  Cardiovascular:     Rate and Rhythm: Normal rate and regular rhythm.     Comments: Porta cath right upper chest Pulmonary:     Effort: Pulmonary effort is normal.     Breath sounds: Normal breath sounds.  Abdominal:     General: Bowel sounds are normal.     Palpations: Abdomen is soft.     Tenderness: There is abdominal tenderness.  Musculoskeletal:     Cervical back: Normal range of motion.  Skin:    General: Skin is warm and dry.  Neurological:     Mental Status: He is alert and oriented to person, place, and time.     BP (!) 126/92   Pulse 92   Ht _0  (1.676 m)   SpO2 99%   BMI 29.05 kg/m  Wt Readings from Last 3 Encounters:  01/23/21 180 lb (81.6 kg)  12/26/20 180 lb (81.6 kg)  12/20/20 180 lb (81.6 kg)     Health Maintenance Due  Topic Date Due  . PNEUMOCOCCAL POLYSACCHARIDE VACCINE AGE 59-64 HIGH RISK  Never done  . COVID-19 Vaccine (1) Never done  . Pneumococcal Vaccine 55-62 Years old (1 of 4 - PCV13) Never done  . FOOT EXAM  Never done  . OPHTHALMOLOGY EXAM  Never done  . URINE MICROALBUMIN  Never done  . Hepatitis C Screening  Never done  . TETANUS/TDAP  Never done    There are no preventive care reminders to display for this patient.  Lab Results  Component Value  Date   TSH 1.67 06/28/2014   Lab Results  Component  Value Date   WBC 5.5 12/14/2020   HGB 11.5 (L) 12/14/2020   HCT 36.8 (L) 12/14/2020   MCV 87.6 12/14/2020   PLT 159 12/14/2020   Lab Results  Component Value Date   NA 133 (L) 12/14/2020   K 3.6 12/14/2020   CO2 26 12/14/2020   GLUCOSE 119 (H) 12/14/2020   BUN 26 (H) 12/14/2020   CREATININE 8.66 (H) 12/14/2020   BILITOT 0.6 09/22/2020   ALKPHOS 194 (H) 09/22/2020   AST 12 (L) 09/22/2020   ALT 19 09/22/2020   PROT 6.6 09/22/2020   ALBUMIN 2.2 (L) 10/21/2020   CALCIUM 8.5 (L) 12/14/2020   ANIONGAP 14 12/14/2020   No results found for: CHOL No results found for: HDL No results found for: LDLCALC No results found for: TRIG No results found for: CHOLHDL Lab Results  Component Value Date   HGBA1C 7.3 (H) 09/25/2020      Assessment & Plan:   7.1 A1C Reid was seen today for diarrhea and diabetes.  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    Kerin Perna, NP

## 2021-02-13 NOTE — Progress Notes (Signed)
Has been having diarrhea for a while noe he states. Discuss power wheelchair.

## 2021-02-14 DIAGNOSIS — I10 Essential (primary) hypertension: Secondary | ICD-10-CM | POA: Diagnosis not present

## 2021-02-14 DIAGNOSIS — Z992 Dependence on renal dialysis: Secondary | ICD-10-CM | POA: Diagnosis not present

## 2021-02-14 DIAGNOSIS — R69 Illness, unspecified: Secondary | ICD-10-CM | POA: Diagnosis not present

## 2021-02-14 DIAGNOSIS — S88911A Complete traumatic amputation of right lower leg, level unspecified, initial encounter: Secondary | ICD-10-CM | POA: Diagnosis not present

## 2021-02-14 DIAGNOSIS — T8249XA Other complication of vascular dialysis catheter, initial encounter: Secondary | ICD-10-CM | POA: Diagnosis not present

## 2021-02-14 DIAGNOSIS — D688 Other specified coagulation defects: Secondary | ICD-10-CM | POA: Diagnosis not present

## 2021-02-14 DIAGNOSIS — R5381 Other malaise: Secondary | ICD-10-CM | POA: Diagnosis not present

## 2021-02-14 DIAGNOSIS — Z743 Need for continuous supervision: Secondary | ICD-10-CM | POA: Diagnosis not present

## 2021-02-14 DIAGNOSIS — N186 End stage renal disease: Secondary | ICD-10-CM | POA: Diagnosis not present

## 2021-02-14 DIAGNOSIS — N2581 Secondary hyperparathyroidism of renal origin: Secondary | ICD-10-CM | POA: Diagnosis not present

## 2021-02-15 DIAGNOSIS — I10 Essential (primary) hypertension: Secondary | ICD-10-CM | POA: Diagnosis not present

## 2021-02-16 DIAGNOSIS — I10 Essential (primary) hypertension: Secondary | ICD-10-CM | POA: Diagnosis not present

## 2021-02-17 DIAGNOSIS — N2581 Secondary hyperparathyroidism of renal origin: Secondary | ICD-10-CM | POA: Diagnosis not present

## 2021-02-17 DIAGNOSIS — R531 Weakness: Secondary | ICD-10-CM | POA: Diagnosis not present

## 2021-02-17 DIAGNOSIS — D688 Other specified coagulation defects: Secondary | ICD-10-CM | POA: Diagnosis not present

## 2021-02-17 DIAGNOSIS — N186 End stage renal disease: Secondary | ICD-10-CM | POA: Diagnosis not present

## 2021-02-17 DIAGNOSIS — Z992 Dependence on renal dialysis: Secondary | ICD-10-CM | POA: Diagnosis not present

## 2021-02-17 DIAGNOSIS — I10 Essential (primary) hypertension: Secondary | ICD-10-CM | POA: Diagnosis not present

## 2021-02-17 DIAGNOSIS — T8249XA Other complication of vascular dialysis catheter, initial encounter: Secondary | ICD-10-CM | POA: Diagnosis not present

## 2021-02-17 DIAGNOSIS — Z743 Need for continuous supervision: Secondary | ICD-10-CM | POA: Diagnosis not present

## 2021-02-17 DIAGNOSIS — R5381 Other malaise: Secondary | ICD-10-CM | POA: Diagnosis not present

## 2021-02-17 DIAGNOSIS — S8990XA Unspecified injury of unspecified lower leg, initial encounter: Secondary | ICD-10-CM | POA: Diagnosis not present

## 2021-02-18 DIAGNOSIS — I10 Essential (primary) hypertension: Secondary | ICD-10-CM | POA: Diagnosis not present

## 2021-02-19 DIAGNOSIS — S7292XA Unspecified fracture of left femur, initial encounter for closed fracture: Secondary | ICD-10-CM | POA: Diagnosis not present

## 2021-02-19 DIAGNOSIS — N186 End stage renal disease: Secondary | ICD-10-CM | POA: Diagnosis not present

## 2021-02-19 DIAGNOSIS — D688 Other specified coagulation defects: Secondary | ICD-10-CM | POA: Diagnosis not present

## 2021-02-19 DIAGNOSIS — R5381 Other malaise: Secondary | ICD-10-CM | POA: Diagnosis not present

## 2021-02-19 DIAGNOSIS — T8249XA Other complication of vascular dialysis catheter, initial encounter: Secondary | ICD-10-CM | POA: Diagnosis not present

## 2021-02-19 DIAGNOSIS — I10 Essential (primary) hypertension: Secondary | ICD-10-CM | POA: Diagnosis not present

## 2021-02-19 DIAGNOSIS — N2581 Secondary hyperparathyroidism of renal origin: Secondary | ICD-10-CM | POA: Diagnosis not present

## 2021-02-19 DIAGNOSIS — Z992 Dependence on renal dialysis: Secondary | ICD-10-CM | POA: Diagnosis not present

## 2021-02-19 DIAGNOSIS — Z7409 Other reduced mobility: Secondary | ICD-10-CM | POA: Diagnosis not present

## 2021-02-19 DIAGNOSIS — R531 Weakness: Secondary | ICD-10-CM | POA: Diagnosis not present

## 2021-02-19 DIAGNOSIS — Z743 Need for continuous supervision: Secondary | ICD-10-CM | POA: Diagnosis not present

## 2021-02-20 DIAGNOSIS — E1149 Type 2 diabetes mellitus with other diabetic neurological complication: Secondary | ICD-10-CM | POA: Diagnosis not present

## 2021-02-20 DIAGNOSIS — R69 Illness, unspecified: Secondary | ICD-10-CM | POA: Diagnosis not present

## 2021-02-20 DIAGNOSIS — N186 End stage renal disease: Secondary | ICD-10-CM | POA: Diagnosis not present

## 2021-02-20 DIAGNOSIS — Z992 Dependence on renal dialysis: Secondary | ICD-10-CM | POA: Diagnosis not present

## 2021-02-20 DIAGNOSIS — E1151 Type 2 diabetes mellitus with diabetic peripheral angiopathy without gangrene: Secondary | ICD-10-CM | POA: Diagnosis not present

## 2021-02-20 DIAGNOSIS — D631 Anemia in chronic kidney disease: Secondary | ICD-10-CM | POA: Diagnosis not present

## 2021-02-20 DIAGNOSIS — E440 Moderate protein-calorie malnutrition: Secondary | ICD-10-CM | POA: Diagnosis not present

## 2021-02-20 DIAGNOSIS — I120 Hypertensive chronic kidney disease with stage 5 chronic kidney disease or end stage renal disease: Secondary | ICD-10-CM | POA: Diagnosis not present

## 2021-02-20 DIAGNOSIS — I10 Essential (primary) hypertension: Secondary | ICD-10-CM | POA: Diagnosis not present

## 2021-02-20 DIAGNOSIS — E1122 Type 2 diabetes mellitus with diabetic chronic kidney disease: Secondary | ICD-10-CM | POA: Diagnosis not present

## 2021-02-20 DIAGNOSIS — T8781 Dehiscence of amputation stump: Secondary | ICD-10-CM | POA: Diagnosis not present

## 2021-02-21 ENCOUNTER — Telehealth (INDEPENDENT_AMBULATORY_CARE_PROVIDER_SITE_OTHER): Payer: Self-pay | Admitting: Primary Care

## 2021-02-21 DIAGNOSIS — I10 Essential (primary) hypertension: Secondary | ICD-10-CM | POA: Diagnosis not present

## 2021-02-21 NOTE — Telephone Encounter (Signed)
Mark calling from [employer] is calling regarding the pt request for a powerwheel chair. Saw that it was discussed at the last ov. Was michelle going to assist with referral for the power wheel chair. Where forms left? Is there a preferred company that is used? [employer] can also assist with finding a wheelchair for the pt. Please advise CB- [phone] x [phone]

## 2021-02-22 DIAGNOSIS — I10 Essential (primary) hypertension: Secondary | ICD-10-CM | POA: Diagnosis not present

## 2021-02-23 DIAGNOSIS — I10 Essential (primary) hypertension: Secondary | ICD-10-CM | POA: Diagnosis not present

## 2021-02-24 DIAGNOSIS — N186 End stage renal disease: Secondary | ICD-10-CM | POA: Diagnosis not present

## 2021-02-24 DIAGNOSIS — D688 Other specified coagulation defects: Secondary | ICD-10-CM | POA: Diagnosis not present

## 2021-02-24 DIAGNOSIS — I10 Essential (primary) hypertension: Secondary | ICD-10-CM | POA: Diagnosis not present

## 2021-02-24 DIAGNOSIS — N2581 Secondary hyperparathyroidism of renal origin: Secondary | ICD-10-CM | POA: Diagnosis not present

## 2021-02-24 DIAGNOSIS — Z743 Need for continuous supervision: Secondary | ICD-10-CM | POA: Diagnosis not present

## 2021-02-24 DIAGNOSIS — Z992 Dependence on renal dialysis: Secondary | ICD-10-CM | POA: Diagnosis not present

## 2021-02-24 DIAGNOSIS — R5381 Other malaise: Secondary | ICD-10-CM | POA: Diagnosis not present

## 2021-02-24 DIAGNOSIS — R531 Weakness: Secondary | ICD-10-CM | POA: Diagnosis not present

## 2021-02-24 DIAGNOSIS — T8249XA Other complication of vascular dialysis catheter, initial encounter: Secondary | ICD-10-CM | POA: Diagnosis not present

## 2021-02-24 DIAGNOSIS — G8929 Other chronic pain: Secondary | ICD-10-CM | POA: Diagnosis not present

## 2021-02-25 DIAGNOSIS — N186 End stage renal disease: Secondary | ICD-10-CM | POA: Diagnosis not present

## 2021-02-25 DIAGNOSIS — Z992 Dependence on renal dialysis: Secondary | ICD-10-CM | POA: Diagnosis not present

## 2021-02-25 DIAGNOSIS — I129 Hypertensive chronic kidney disease with stage 1 through stage 4 chronic kidney disease, or unspecified chronic kidney disease: Secondary | ICD-10-CM | POA: Diagnosis not present

## 2021-02-25 DIAGNOSIS — I10 Essential (primary) hypertension: Secondary | ICD-10-CM | POA: Diagnosis not present

## 2021-02-25 NOTE — Telephone Encounter (Signed)
Please follow up regarding power chair request. Including CM in-case additional info/assistance is needed.

## 2021-02-25 NOTE — Telephone Encounter (Signed)
Mark with [employer] called for an update on the request for power wheelchair. Cb# [phone] Ext. [phone]

## 2021-02-25 NOTE — Telephone Encounter (Signed)
Please advise 

## 2021-02-26 DIAGNOSIS — N186 End stage renal disease: Secondary | ICD-10-CM | POA: Diagnosis not present

## 2021-02-26 DIAGNOSIS — I10 Essential (primary) hypertension: Secondary | ICD-10-CM | POA: Diagnosis not present

## 2021-02-26 DIAGNOSIS — Z743 Need for continuous supervision: Secondary | ICD-10-CM | POA: Diagnosis not present

## 2021-02-26 DIAGNOSIS — T8249XA Other complication of vascular dialysis catheter, initial encounter: Secondary | ICD-10-CM | POA: Diagnosis not present

## 2021-02-26 DIAGNOSIS — R29898 Other symptoms and signs involving the musculoskeletal system: Secondary | ICD-10-CM | POA: Diagnosis not present

## 2021-02-26 DIAGNOSIS — R531 Weakness: Secondary | ICD-10-CM | POA: Diagnosis not present

## 2021-02-26 DIAGNOSIS — Z992 Dependence on renal dialysis: Secondary | ICD-10-CM | POA: Diagnosis not present

## 2021-02-26 DIAGNOSIS — N2581 Secondary hyperparathyroidism of renal origin: Secondary | ICD-10-CM | POA: Diagnosis not present

## 2021-02-26 DIAGNOSIS — R5381 Other malaise: Secondary | ICD-10-CM | POA: Diagnosis not present

## 2021-02-26 DIAGNOSIS — D688 Other specified coagulation defects: Secondary | ICD-10-CM | POA: Diagnosis not present

## 2021-02-27 DIAGNOSIS — I10 Essential (primary) hypertension: Secondary | ICD-10-CM | POA: Diagnosis not present

## 2021-02-27 NOTE — Telephone Encounter (Signed)
Pts mother called in to follow up on an order for a wheelchair, caller stated that a fax should have already been received. Please advise.

## 2021-02-28 DIAGNOSIS — N186 End stage renal disease: Secondary | ICD-10-CM | POA: Diagnosis not present

## 2021-02-28 DIAGNOSIS — R531 Weakness: Secondary | ICD-10-CM | POA: Diagnosis not present

## 2021-02-28 DIAGNOSIS — R5381 Other malaise: Secondary | ICD-10-CM | POA: Diagnosis not present

## 2021-02-28 DIAGNOSIS — T8249XA Other complication of vascular dialysis catheter, initial encounter: Secondary | ICD-10-CM | POA: Diagnosis not present

## 2021-02-28 DIAGNOSIS — D688 Other specified coagulation defects: Secondary | ICD-10-CM | POA: Diagnosis not present

## 2021-02-28 DIAGNOSIS — N2581 Secondary hyperparathyroidism of renal origin: Secondary | ICD-10-CM | POA: Diagnosis not present

## 2021-02-28 DIAGNOSIS — Z743 Need for continuous supervision: Secondary | ICD-10-CM | POA: Diagnosis not present

## 2021-02-28 DIAGNOSIS — I10 Essential (primary) hypertension: Secondary | ICD-10-CM | POA: Diagnosis not present

## 2021-02-28 DIAGNOSIS — Z992 Dependence on renal dialysis: Secondary | ICD-10-CM | POA: Diagnosis not present

## 2021-02-28 NOTE — Telephone Encounter (Signed)
Has a fax been received for the order?

## 2021-03-01 DIAGNOSIS — I10 Essential (primary) hypertension: Secondary | ICD-10-CM | POA: Diagnosis not present

## 2021-03-02 DIAGNOSIS — I10 Essential (primary) hypertension: Secondary | ICD-10-CM | POA: Diagnosis not present

## 2021-03-03 ENCOUNTER — Telehealth: Payer: Self-pay

## 2021-03-03 DIAGNOSIS — R531 Weakness: Secondary | ICD-10-CM | POA: Diagnosis not present

## 2021-03-03 DIAGNOSIS — D688 Other specified coagulation defects: Secondary | ICD-10-CM | POA: Diagnosis not present

## 2021-03-03 DIAGNOSIS — D631 Anemia in chronic kidney disease: Secondary | ICD-10-CM | POA: Diagnosis not present

## 2021-03-03 DIAGNOSIS — T8249XA Other complication of vascular dialysis catheter, initial encounter: Secondary | ICD-10-CM | POA: Diagnosis not present

## 2021-03-03 DIAGNOSIS — N186 End stage renal disease: Secondary | ICD-10-CM | POA: Diagnosis not present

## 2021-03-03 DIAGNOSIS — R5381 Other malaise: Secondary | ICD-10-CM | POA: Diagnosis not present

## 2021-03-03 DIAGNOSIS — Z992 Dependence on renal dialysis: Secondary | ICD-10-CM | POA: Diagnosis not present

## 2021-03-03 DIAGNOSIS — Z7409 Other reduced mobility: Secondary | ICD-10-CM | POA: Diagnosis not present

## 2021-03-03 DIAGNOSIS — I10 Essential (primary) hypertension: Secondary | ICD-10-CM | POA: Diagnosis not present

## 2021-03-03 DIAGNOSIS — Z743 Need for continuous supervision: Secondary | ICD-10-CM | POA: Diagnosis not present

## 2021-03-03 DIAGNOSIS — N2581 Secondary hyperparathyroidism of renal origin: Secondary | ICD-10-CM | POA: Diagnosis not present

## 2021-03-03 DIAGNOSIS — R519 Headache, unspecified: Secondary | ICD-10-CM | POA: Diagnosis not present

## 2021-03-03 NOTE — Telephone Encounter (Signed)
Pt called to let us know he is ready for his prosthesis for his L AKA and has completed rehab. Pt has been scheduled with PA for f/u. No further questions/concerns at this time.

## 2021-03-04 DIAGNOSIS — I10 Essential (primary) hypertension: Secondary | ICD-10-CM | POA: Diagnosis not present

## 2021-03-05 DIAGNOSIS — I10 Essential (primary) hypertension: Secondary | ICD-10-CM | POA: Diagnosis not present

## 2021-03-05 DIAGNOSIS — N2581 Secondary hyperparathyroidism of renal origin: Secondary | ICD-10-CM | POA: Diagnosis not present

## 2021-03-05 DIAGNOSIS — R519 Headache, unspecified: Secondary | ICD-10-CM | POA: Diagnosis not present

## 2021-03-05 DIAGNOSIS — Z743 Need for continuous supervision: Secondary | ICD-10-CM | POA: Diagnosis not present

## 2021-03-05 DIAGNOSIS — G8929 Other chronic pain: Secondary | ICD-10-CM | POA: Diagnosis not present

## 2021-03-05 DIAGNOSIS — R531 Weakness: Secondary | ICD-10-CM | POA: Diagnosis not present

## 2021-03-05 DIAGNOSIS — Z992 Dependence on renal dialysis: Secondary | ICD-10-CM | POA: Diagnosis not present

## 2021-03-05 DIAGNOSIS — N186 End stage renal disease: Secondary | ICD-10-CM | POA: Diagnosis not present

## 2021-03-05 DIAGNOSIS — T8249XA Other complication of vascular dialysis catheter, initial encounter: Secondary | ICD-10-CM | POA: Diagnosis not present

## 2021-03-05 DIAGNOSIS — D688 Other specified coagulation defects: Secondary | ICD-10-CM | POA: Diagnosis not present

## 2021-03-05 DIAGNOSIS — R5381 Other malaise: Secondary | ICD-10-CM | POA: Diagnosis not present

## 2021-03-05 DIAGNOSIS — D631 Anemia in chronic kidney disease: Secondary | ICD-10-CM | POA: Diagnosis not present

## 2021-03-05 DIAGNOSIS — Z7401 Bed confinement status: Secondary | ICD-10-CM | POA: Diagnosis not present

## 2021-03-05 NOTE — Telephone Encounter (Signed)
NO. Patient has to present for face to face visit before order can be sent. He is already scheduled for an appt.

## 2021-03-06 DIAGNOSIS — G8929 Other chronic pain: Secondary | ICD-10-CM | POA: Diagnosis not present

## 2021-03-06 DIAGNOSIS — Z992 Dependence on renal dialysis: Secondary | ICD-10-CM | POA: Diagnosis not present

## 2021-03-06 DIAGNOSIS — N186 End stage renal disease: Secondary | ICD-10-CM | POA: Diagnosis not present

## 2021-03-06 DIAGNOSIS — N2581 Secondary hyperparathyroidism of renal origin: Secondary | ICD-10-CM | POA: Diagnosis not present

## 2021-03-06 DIAGNOSIS — E1051 Type 1 diabetes mellitus with diabetic peripheral angiopathy without gangrene: Secondary | ICD-10-CM | POA: Diagnosis not present

## 2021-03-06 DIAGNOSIS — I10 Essential (primary) hypertension: Secondary | ICD-10-CM | POA: Diagnosis not present

## 2021-03-06 DIAGNOSIS — Z89421 Acquired absence of other right toe(s): Secondary | ICD-10-CM | POA: Diagnosis not present

## 2021-03-06 DIAGNOSIS — E1022 Type 1 diabetes mellitus with diabetic chronic kidney disease: Secondary | ICD-10-CM | POA: Diagnosis not present

## 2021-03-06 DIAGNOSIS — E1065 Type 1 diabetes mellitus with hyperglycemia: Secondary | ICD-10-CM | POA: Diagnosis not present

## 2021-03-06 DIAGNOSIS — Z89612 Acquired absence of left leg above knee: Secondary | ICD-10-CM | POA: Diagnosis not present

## 2021-03-06 DIAGNOSIS — Z008 Encounter for other general examination: Secondary | ICD-10-CM | POA: Diagnosis not present

## 2021-03-06 DIAGNOSIS — E1040 Type 1 diabetes mellitus with diabetic neuropathy, unspecified: Secondary | ICD-10-CM | POA: Diagnosis not present

## 2021-03-07 DIAGNOSIS — R197 Diarrhea, unspecified: Secondary | ICD-10-CM | POA: Diagnosis not present

## 2021-03-07 DIAGNOSIS — Z7401 Bed confinement status: Secondary | ICD-10-CM | POA: Diagnosis not present

## 2021-03-07 DIAGNOSIS — Z992 Dependence on renal dialysis: Secondary | ICD-10-CM | POA: Diagnosis not present

## 2021-03-07 DIAGNOSIS — D631 Anemia in chronic kidney disease: Secondary | ICD-10-CM | POA: Diagnosis not present

## 2021-03-07 DIAGNOSIS — R5381 Other malaise: Secondary | ICD-10-CM | POA: Diagnosis not present

## 2021-03-07 DIAGNOSIS — I10 Essential (primary) hypertension: Secondary | ICD-10-CM | POA: Diagnosis not present

## 2021-03-07 DIAGNOSIS — S99929A Unspecified injury of unspecified foot, initial encounter: Secondary | ICD-10-CM | POA: Diagnosis not present

## 2021-03-07 DIAGNOSIS — N186 End stage renal disease: Secondary | ICD-10-CM | POA: Diagnosis not present

## 2021-03-07 DIAGNOSIS — Z743 Need for continuous supervision: Secondary | ICD-10-CM | POA: Diagnosis not present

## 2021-03-07 DIAGNOSIS — T8249XA Other complication of vascular dialysis catheter, initial encounter: Secondary | ICD-10-CM | POA: Diagnosis not present

## 2021-03-07 DIAGNOSIS — D688 Other specified coagulation defects: Secondary | ICD-10-CM | POA: Diagnosis not present

## 2021-03-07 DIAGNOSIS — N2581 Secondary hyperparathyroidism of renal origin: Secondary | ICD-10-CM | POA: Diagnosis not present

## 2021-03-07 DIAGNOSIS — R519 Headache, unspecified: Secondary | ICD-10-CM | POA: Diagnosis not present

## 2021-03-08 DIAGNOSIS — I10 Essential (primary) hypertension: Secondary | ICD-10-CM | POA: Diagnosis not present

## 2021-03-08 DIAGNOSIS — S91302A Unspecified open wound, left foot, initial encounter: Secondary | ICD-10-CM | POA: Diagnosis not present

## 2021-03-08 DIAGNOSIS — Z4781 Encounter for orthopedic aftercare following surgical amputation: Secondary | ICD-10-CM | POA: Diagnosis not present

## 2021-03-09 DIAGNOSIS — I10 Essential (primary) hypertension: Secondary | ICD-10-CM | POA: Diagnosis not present

## 2021-03-10 DIAGNOSIS — I10 Essential (primary) hypertension: Secondary | ICD-10-CM | POA: Diagnosis not present

## 2021-03-10 DIAGNOSIS — R5381 Other malaise: Secondary | ICD-10-CM | POA: Diagnosis not present

## 2021-03-10 DIAGNOSIS — R531 Weakness: Secondary | ICD-10-CM | POA: Diagnosis not present

## 2021-03-10 DIAGNOSIS — N186 End stage renal disease: Secondary | ICD-10-CM | POA: Diagnosis not present

## 2021-03-10 DIAGNOSIS — D688 Other specified coagulation defects: Secondary | ICD-10-CM | POA: Diagnosis not present

## 2021-03-10 DIAGNOSIS — Z992 Dependence on renal dialysis: Secondary | ICD-10-CM | POA: Diagnosis not present

## 2021-03-10 DIAGNOSIS — Z743 Need for continuous supervision: Secondary | ICD-10-CM | POA: Diagnosis not present

## 2021-03-10 DIAGNOSIS — Z7401 Bed confinement status: Secondary | ICD-10-CM | POA: Diagnosis not present

## 2021-03-10 DIAGNOSIS — T8249XA Other complication of vascular dialysis catheter, initial encounter: Secondary | ICD-10-CM | POA: Diagnosis not present

## 2021-03-10 DIAGNOSIS — N2581 Secondary hyperparathyroidism of renal origin: Secondary | ICD-10-CM | POA: Diagnosis not present

## 2021-03-11 ENCOUNTER — Telehealth (INDEPENDENT_AMBULATORY_CARE_PROVIDER_SITE_OTHER): Payer: Self-pay | Admitting: Primary Care

## 2021-03-11 ENCOUNTER — Other Ambulatory Visit (INDEPENDENT_AMBULATORY_CARE_PROVIDER_SITE_OTHER): Payer: Self-pay | Admitting: Primary Care

## 2021-03-11 DIAGNOSIS — Z992 Dependence on renal dialysis: Secondary | ICD-10-CM

## 2021-03-11 DIAGNOSIS — I10 Essential (primary) hypertension: Secondary | ICD-10-CM | POA: Diagnosis not present

## 2021-03-11 NOTE — Telephone Encounter (Signed)
Copied from [place] [id number]. Topic: Quick Communication - Rx Refill/Question >> Mar 11, 2021  4:50 PM Tessa Lerner A wrote: Medication: glucose blood (ONETOUCH VERIO) test strip   Has the patient contacted their pharmacy? Yes.   (Agent: If no, request that the patient contact the pharmacy for the refill.) (Agent: If yes, when and what did the pharmacy advise?)  Preferred Pharmacy (with phone number or street name): [place] ([place]), [place] - [address]  Phone:  [phone] Fax:  [phone]  Agent: Please be advised that RX refills may take up to 3 business days. We ask that you follow-up with your pharmacy.

## 2021-03-12 ENCOUNTER — Ambulatory Visit: Payer: Medicare HMO

## 2021-03-12 DIAGNOSIS — T8249XA Other complication of vascular dialysis catheter, initial encounter: Secondary | ICD-10-CM | POA: Diagnosis not present

## 2021-03-12 DIAGNOSIS — I10 Essential (primary) hypertension: Secondary | ICD-10-CM | POA: Diagnosis not present

## 2021-03-12 DIAGNOSIS — R5381 Other malaise: Secondary | ICD-10-CM | POA: Diagnosis not present

## 2021-03-12 DIAGNOSIS — N2581 Secondary hyperparathyroidism of renal origin: Secondary | ICD-10-CM | POA: Diagnosis not present

## 2021-03-12 DIAGNOSIS — N186 End stage renal disease: Secondary | ICD-10-CM | POA: Diagnosis not present

## 2021-03-12 DIAGNOSIS — D688 Other specified coagulation defects: Secondary | ICD-10-CM | POA: Diagnosis not present

## 2021-03-12 DIAGNOSIS — R531 Weakness: Secondary | ICD-10-CM | POA: Diagnosis not present

## 2021-03-12 DIAGNOSIS — Z743 Need for continuous supervision: Secondary | ICD-10-CM | POA: Diagnosis not present

## 2021-03-12 DIAGNOSIS — Z7401 Bed confinement status: Secondary | ICD-10-CM | POA: Diagnosis not present

## 2021-03-12 DIAGNOSIS — Z992 Dependence on renal dialysis: Secondary | ICD-10-CM | POA: Diagnosis not present

## 2021-03-13 ENCOUNTER — Ambulatory Visit (INDEPENDENT_AMBULATORY_CARE_PROVIDER_SITE_OTHER): Payer: Medicare HMO | Admitting: Physician Assistant

## 2021-03-13 ENCOUNTER — Other Ambulatory Visit: Payer: Self-pay

## 2021-03-13 VITALS — BP 132/95 | Temp 97.9°F | Resp 16 | Wt 170.0 lb

## 2021-03-13 DIAGNOSIS — I10 Essential (primary) hypertension: Secondary | ICD-10-CM | POA: Diagnosis not present

## 2021-03-13 DIAGNOSIS — R5381 Other malaise: Secondary | ICD-10-CM | POA: Diagnosis not present

## 2021-03-13 DIAGNOSIS — Z89612 Acquired absence of left leg above knee: Secondary | ICD-10-CM

## 2021-03-13 DIAGNOSIS — Z743 Need for continuous supervision: Secondary | ICD-10-CM | POA: Diagnosis not present

## 2021-03-13 DIAGNOSIS — R531 Weakness: Secondary | ICD-10-CM | POA: Diagnosis not present

## 2021-03-13 MED ORDER — GABAPENTIN 100 MG PO CAPS: 100.0000 mg | ORAL_CAPSULE | Freq: Every day | ORAL | 1 refills | Status: DC

## 2021-03-13 NOTE — Progress Notes (Signed)
HISTORY AND PHYSICAL     CC:  follow up Requesting Provider:  Kerin Perna, NP  HPI: [name] is a 40 y.o. (02-Sep-1981) male who presents for follow up.  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 Oneida Alar.  He did have some issues with healing.  He presents today for evaluation for his prosthesis. He states that his incision has healed but he is having phantom pain.  He has not tried Neurontin.    He has hx of ESRD on M/W/F at the [facility] location.  The pt is not on a statin for cholesterol management.  The pt is not on a daily aspirin.   Other AC:  none The pt is not on medication for hypertension.   The pt is  diabetic.   Tobacco hx:  never    Past Medical History:  Diagnosis Date   Anemia    Diabetes mellitus without complication ([place])    Diabetic gastroparesis ([place])    Dialysis patient [facility])    Hypertension    Renal disorder    Dialysis   Sepsis [facility])     Past Surgical History:  Procedure Laterality Date   AMPUTATION Right 02/02/2018   Procedure: RIGHT FIFTH TOE AND METATARSAL AMPUTATION. Filetted toe flap metatarsal resection. Debridement Plantar Foot wound;  Surgeon: Evelina Bucy, DPM;  Location: [place];  Service: Podiatry;  Laterality: Right;   AMPUTATION Left 08/20/2018   Procedure: FIFTH METATARSAL BONE BIOPSY;  Surgeon: Evelina Bucy, DPM;  Location: [place];  Service: Podiatry;  Laterality: Left;   AMPUTATION Left 10/28/2018   Procedure: LEFT GREAT TOE AMPUTATION;  Surgeon: Evelina Bucy, DPM;  Location: [place];  Service: Podiatry;  Laterality: Left;   AMPUTATION Left 09/16/2020   Procedure: AMPUTATION BELOW KNEE;  Surgeon: Rosetta Posner, MD;  Location: [place];  Service: Vascular;  Laterality: Left;   AMPUTATION Left 10/15/2020   Procedure: AMPUTATION ABOVE KNEE;  Surgeon: Elam Dutch, MD;  Location: [place];  Service: Vascular;  Laterality: Left;   APPLICATION OF WOUND VAC  02/02/2018    Procedure: APPLICATION OF WOUND VAC  Right Foot;  Surgeon: Evelina Bucy, DPM;  Location: [place];  Service: Podiatry;;   APPLICATION OF WOUND VAC Left 10/28/2018   Procedure: APPLICATION OF WOUND VAC LEFT TOE;  Surgeon: Evelina Bucy, DPM;  Location: [place];  Service: Podiatry;  Laterality: Left;   APPLICATION OF WOUND VAC Left 11/01/2018   Procedure: APPLICATION OF WOUND VAC;  Surgeon: Evelina Bucy, DPM;  Location: [place];  Service: Podiatry;  Laterality: Left;   AV FISTULA PLACEMENT     left arm.   AV FISTULA PLACEMENT Right 12/22/2016   Procedure: INSERTION OF ARTERIOVENOUS (AV) GORE-TEX GRAFT ARM;  Surgeon: Elam Dutch, MD;  Location: [place];  Service: Vascular;  Laterality: Right;   AV FISTULA PLACEMENT Left 05/26/2018   Procedure: INSERTION OF  ARTERIOVENOUS (AV) GORE-TEX GRAFT LEFT ARM;  Surgeon: Serafina Mitchell, MD;  Location: [place];  Service: Vascular;  Laterality: Left;   EYE SURGERY     I & D EXTREMITY Right 10/31/2017   Procedure: IRRIGATION AND DEBRIDEMENT RIGHT FOOT;  Surgeon: Evelina Bucy, DPM;  Location: [place];  Service: Podiatry;  Laterality: Right;   I & D EXTREMITY Left 08/20/2018   Procedure: IRRIGATION AND DEBRIDEMENT EXTREMITY WITH SECONDARY WOUND CLOSUREAND APPLICATION OF WOUND VAC LEFT FOOT;  Surgeon: Evelina Bucy, DPM;  Location: [place];  Service: Podiatry;  Laterality: Left;   I & D EXTREMITY Left 10/20/2018   Procedure: IRRIGATION AND DEBRIDEMENT LEFT FOOT  DEBRIDEMENT LATERAL FOOT WOUND;  Surgeon: Evelina Bucy, DPM;  Location: [place];  Service: Podiatry;  Laterality: Left;   I & D EXTREMITY Left 10/28/2018   Procedure: IRRIGATION AND DEBRIDEMENT LEFT TOE;  Surgeon: Evelina Bucy, DPM;  Location: [place];  Service: Podiatry;  Laterality: Left;   I & D EXTREMITY Left 09/14/2020   Procedure: IRRIGATION AND DEBRIDEMENT WRIST;  Surgeon: Leanora Cover, MD;  Location: [place];  Service: Orthopedics;  Laterality: Left;   INSERTION OF DIALYSIS CATHETER     Right  subclavian   IR AV DIALY SHUNT INTRO NEEDLE/INTRACATH INITIAL W/PTA/IMG RIGHT Right 02/05/2018   IR FLUORO GUIDE CV LINE RIGHT  01/31/2020   IR THROMBECTOMY AV FISTULA W/THROMBOLYSIS/PTA INC/SHUNT/IMG LEFT Left 08/24/2018   IR THROMBECTOMY AV FISTULA W/THROMBOLYSIS/PTA INC/SHUNT/IMG LEFT Left 01/06/2019   IR US GUIDE VASC ACCESS LEFT  08/24/2018   IR US GUIDE VASC ACCESS RIGHT  02/05/2018   IR US GUIDE VASC ACCESS RIGHT  01/31/2020   IRRIGATION AND DEBRIDEMENT FOOT Right 10/23/2018   Procedure: Irrigation and Debridement to tendon, Left Foot;  Surgeon: Evelina Bucy, DPM;  Location: [place];  Service: Podiatry;  Laterality: Right;   IRRIGATION AND DEBRIDEMENT FOOT Left 11/01/2018   Procedure: IRRIGATION AND DEBRIDEMENT PARTIAL WOUND CLOSURE LOCAL TISSUE TRANSFER AND FLAP ROTATION, LEFT FOOT;  Surgeon: Evelina Bucy, DPM;  Location: [place];  Service: Podiatry;  Laterality: Left;   TRANSMETATARSAL AMPUTATION N/A 08/18/2018   Procedure: IRRIGATION AND DEBRIDEMENT OF LEFT 5TH TOE AND TRANSMETATARSAL, WITH PARTICAL LEFT 5TH TOE AND METATARSAL AMPUTATION, BONE BIOPSY, WOUND VAC APPLICATION.;  Surgeon: Evelina Bucy, DPM;  Location: [place];  Service: Podiatry;  Laterality: N/A;   UPPER EXTREMITY VENOGRAPHY N/A 11/16/2016   Procedure: Upper Extremity Venography - Right Central;  Surgeon: Elam Dutch, MD;  Location: [place] CV LAB;  Service: Cardiovascular;  Laterality: N/A;   UPPER EXTREMITY VENOGRAPHY N/A 05/25/2018   Procedure: UPPER EXTREMITY VENOGRAPHY - Bilateral;  Surgeon: Marty Heck, MD;  Location: [employer] CV LAB;  Service: Cardiovascular;  Laterality: N/A;    Social History   Socioeconomic History   Marital status: Single    Spouse name: Not on file   Number of children: Not on file   Years of education: Not on file   Highest education level: Not on file  Occupational History   Not on file  Tobacco Use   Smoking status: Never   Smokeless tobacco: Never  Vaping Use    Vaping Use: Never used  Substance and Sexual Activity   Alcohol use: No   Drug use: No   Sexual activity: Never  Other Topics Concern   Not on file  Social History Narrative   Not on file   Social Determinants of Health   Financial Resource Strain: Not on file  Food Insecurity: Not on file  Transportation Needs: Not on file  Physical Activity: Not on file  Stress: Not on file  Social Connections: Not on file  Intimate Partner Violence: Not on file     Family History  Problem Relation Age of Onset   Diabetes Mellitus II Other    Diabetes Father    Renal Disease Father        ESRD    Current Outpatient Medications  Medication Sig Dispense Refill   albuterol (VENTOLIN HFA) 108 (90 Base) MCG/ACT inhaler Inhale 2 puffs into the lungs every 6 (six) hours as needed for wheezing or shortness of breath. 6.7 g 0   Blood Glucose Monitoring Suppl (ONETOUCH VERIO FLEX SYSTEM) w/Device KIT 1 Bag by Does not apply route 3 (three) times daily before meals. 1 kit 0   calcitRIOL (ROCALTROL) 0.5 MCG capsule Take 5 capsules (2.5 mcg total) by mouth every Monday, Wednesday, and Friday with hemodialysis. 30 capsule 0   cinacalcet (SENSIPAR) 60 MG tablet Take 120 mg by mouth daily after supper.     glucose blood (ONETOUCH VERIO) test strip USE 1 STRIP TO CHECK GLUCOSE THREE TIMES DAILY 100 each 2   Lancets (ONETOUCH DELICA PLUS [id number]) MISC Apply topically.     Methoxy PEG-Epoetin Beta (MIRCERA IJ)      NOVOLOG 100 UNIT/ML injection Inject 6 Units into the skin 3 (three) times daily with meals. INJECT 6 UNITS SUBCUTANEOUSLY TWICE DAILY WITH A MEAL (Patient taking differently: Inject 6 Units into the skin 2 (two) times daily with a meal.) 10 mL 3   oxyCODONE-acetaminophen (PERCOCET) 5-325 MG tablet Take 1 tablet by mouth every 4 (four) hours as needed for severe pain. 20 tablet 0   sevelamer carbonate (RENVELA) 800 MG tablet Take 2,400-4,000 mg by mouth See admin instructions. Take 4,000 mg (5  tablets) by mouth with meals and 2,400 mg (3 tablets) with snacks     No current facility-administered medications for this visit.    No Known Allergies   REVIEW OF SYSTEMS:   _0  denotes positive finding, _1  denotes negative finding Cardiac  Comments:  Chest pain or chest pressure:    Shortness of breath upon exertion:    Short of breath when lying flat:    Irregular heart rhythm:        Vascular    Pain in calf, thigh, or hip brought on by ambulation:    Pain in feet at night that wakes you up from your sleep:     Blood clot in your veins:    Leg swelling:     Phantom pain x   Pulmonary    Oxygen at home:    Productive cough:     Wheezing:         Neurologic    Sudden weakness in arms or legs:     Sudden numbness in arms or legs:     Sudden onset of difficulty speaking or slurred speech:    Temporary loss of vision in one eye:     Problems with dizziness:         Gastrointestinal    Blood in stool:     Vomited blood:         Genitourinary    Burning when urinating:     Blood in urine:        Psychiatric    Major depression:         Hematologic    Bleeding problems:    Problems with blood clotting too easily:        Skin    Rashes or ulcers:        Constitutional    Fever or chills:      PHYSICAL EXAMINATION:  Today's Vitals   03/13/21 1053 03/13/21 1058  BP: (!) 132/95   Resp: 16   Temp: 97.9 F (36.6 C)   TempSrc: Temporal   Weight: 170 lb (77.1 kg)   PainSc:  9  9   PainLoc: Leg    Body mass index is 27.44 kg/m.   General:  WDWN in NAD; vital signs documented above Gait: Not observed-on stretcher HENT: WNL, normocephalic Pulmonary: normal non-labored breathing Cardiac: regular HR Skin: left AKA incision has healed nicely. Extremities: left AKA healed nicely.  Good ROM Musculoskeletal: no muscle wasting or atrophy  Neurologic: A&O X 3;  No focal weakness or paresthesias are detected; speech fluent/normal Psychiatric:  The pt has  Normal affect.    ASSESSMENT/PLAN:: 40 y.o. 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 Oneida Alar.   -left AKA incision has healed nicely.  Will refer to [facility] for prosthesis.   -he is having phantom pain of LLE.  Discussed with Dr. Oneida Alar and will start him on Neurontin for this.  Will start 161m daily given he has ESRD.  Will defer to Dr. JZoila Shutterfor further management of this.   -his mother is also inquiring about a GI referral - will need PCP to refer for this.   -He is in need of new wheelchair given the arm is broken on his current one.  He also needs ramp.  Our office does not manage DME equipment/needs and this is usually handled by PCP office.   ---------------------------------------------------------------------------- The patient has a left Above Knee Amputation. The patient is well motivated to return to their prior functional status by utilizing a prosthesis to perform ADL's and maintain a healthy lifestyle. The patient has the physical and cognitive capacity to function with a prosthesis.   Functional Level: K2 Limited Community Ambulator: Has the ability or potential for ambulation and to traverse low environmental barriers such as curbs, stairs or uneven surfaces  Residual Limb History: The skin condition of the residual limb is healed. The patient will continue to monitor the skin of the residual limb and follow hygiene instructions.  The patient is experiencing phantom limb pain   Prosthetic Prescription Plan: Counseling and education regarding prosthetic management will be provided to the patient via a certified prosthetist. A multi-discipline team, including physical therapy, will manage the prosthetic fabrication, fitting and prosthetic gait training.     SLeontine Locket [facility] [phone] Clinic MD:   FOneida Alar

## 2021-03-14 ENCOUNTER — Telehealth (INDEPENDENT_AMBULATORY_CARE_PROVIDER_SITE_OTHER): Payer: Medicare HMO | Admitting: Primary Care

## 2021-03-14 DIAGNOSIS — R531 Weakness: Secondary | ICD-10-CM | POA: Diagnosis not present

## 2021-03-14 DIAGNOSIS — N186 End stage renal disease: Secondary | ICD-10-CM | POA: Diagnosis not present

## 2021-03-14 DIAGNOSIS — D688 Other specified coagulation defects: Secondary | ICD-10-CM | POA: Diagnosis not present

## 2021-03-14 DIAGNOSIS — Z992 Dependence on renal dialysis: Secondary | ICD-10-CM | POA: Diagnosis not present

## 2021-03-14 DIAGNOSIS — T8249XA Other complication of vascular dialysis catheter, initial encounter: Secondary | ICD-10-CM | POA: Diagnosis not present

## 2021-03-14 DIAGNOSIS — Z743 Need for continuous supervision: Secondary | ICD-10-CM | POA: Diagnosis not present

## 2021-03-14 DIAGNOSIS — I10 Essential (primary) hypertension: Secondary | ICD-10-CM | POA: Diagnosis not present

## 2021-03-14 DIAGNOSIS — Z7401 Bed confinement status: Secondary | ICD-10-CM | POA: Diagnosis not present

## 2021-03-14 DIAGNOSIS — N2581 Secondary hyperparathyroidism of renal origin: Secondary | ICD-10-CM | POA: Diagnosis not present

## 2021-03-14 DIAGNOSIS — R5381 Other malaise: Secondary | ICD-10-CM | POA: Diagnosis not present

## 2021-03-15 DIAGNOSIS — I10 Essential (primary) hypertension: Secondary | ICD-10-CM | POA: Diagnosis not present

## 2021-03-16 DIAGNOSIS — I10 Essential (primary) hypertension: Secondary | ICD-10-CM | POA: Diagnosis not present

## 2021-03-17 DIAGNOSIS — Z743 Need for continuous supervision: Secondary | ICD-10-CM | POA: Diagnosis not present

## 2021-03-17 DIAGNOSIS — I10 Essential (primary) hypertension: Secondary | ICD-10-CM | POA: Diagnosis not present

## 2021-03-17 DIAGNOSIS — R5381 Other malaise: Secondary | ICD-10-CM | POA: Diagnosis not present

## 2021-03-17 DIAGNOSIS — D688 Other specified coagulation defects: Secondary | ICD-10-CM | POA: Diagnosis not present

## 2021-03-17 DIAGNOSIS — T8249XA Other complication of vascular dialysis catheter, initial encounter: Secondary | ICD-10-CM | POA: Diagnosis not present

## 2021-03-17 DIAGNOSIS — N2581 Secondary hyperparathyroidism of renal origin: Secondary | ICD-10-CM | POA: Diagnosis not present

## 2021-03-17 DIAGNOSIS — R531 Weakness: Secondary | ICD-10-CM | POA: Diagnosis not present

## 2021-03-17 DIAGNOSIS — R0902 Hypoxemia: Secondary | ICD-10-CM | POA: Diagnosis not present

## 2021-03-17 DIAGNOSIS — Z992 Dependence on renal dialysis: Secondary | ICD-10-CM | POA: Diagnosis not present

## 2021-03-17 DIAGNOSIS — Z7401 Bed confinement status: Secondary | ICD-10-CM | POA: Diagnosis not present

## 2021-03-17 DIAGNOSIS — N186 End stage renal disease: Secondary | ICD-10-CM | POA: Diagnosis not present

## 2021-03-18 DIAGNOSIS — I10 Essential (primary) hypertension: Secondary | ICD-10-CM | POA: Diagnosis not present

## 2021-03-19 DIAGNOSIS — N2581 Secondary hyperparathyroidism of renal origin: Secondary | ICD-10-CM | POA: Diagnosis not present

## 2021-03-19 DIAGNOSIS — Z7401 Bed confinement status: Secondary | ICD-10-CM | POA: Diagnosis not present

## 2021-03-19 DIAGNOSIS — N186 End stage renal disease: Secondary | ICD-10-CM | POA: Diagnosis not present

## 2021-03-19 DIAGNOSIS — Z992 Dependence on renal dialysis: Secondary | ICD-10-CM | POA: Diagnosis not present

## 2021-03-19 DIAGNOSIS — T8249XA Other complication of vascular dialysis catheter, initial encounter: Secondary | ICD-10-CM | POA: Diagnosis not present

## 2021-03-19 DIAGNOSIS — D688 Other specified coagulation defects: Secondary | ICD-10-CM | POA: Diagnosis not present

## 2021-03-19 DIAGNOSIS — Z743 Need for continuous supervision: Secondary | ICD-10-CM | POA: Diagnosis not present

## 2021-03-19 DIAGNOSIS — R5381 Other malaise: Secondary | ICD-10-CM | POA: Diagnosis not present

## 2021-03-19 DIAGNOSIS — I10 Essential (primary) hypertension: Secondary | ICD-10-CM | POA: Diagnosis not present

## 2021-03-19 DIAGNOSIS — R531 Weakness: Secondary | ICD-10-CM | POA: Diagnosis not present

## 2021-03-20 DIAGNOSIS — I10 Essential (primary) hypertension: Secondary | ICD-10-CM | POA: Diagnosis not present

## 2021-03-21 ENCOUNTER — Ambulatory Visit (INDEPENDENT_AMBULATORY_CARE_PROVIDER_SITE_OTHER): Payer: Medicare HMO | Admitting: Primary Care

## 2021-03-21 DIAGNOSIS — R5381 Other malaise: Secondary | ICD-10-CM | POA: Diagnosis not present

## 2021-03-21 DIAGNOSIS — D688 Other specified coagulation defects: Secondary | ICD-10-CM | POA: Diagnosis not present

## 2021-03-21 DIAGNOSIS — Z743 Need for continuous supervision: Secondary | ICD-10-CM | POA: Diagnosis not present

## 2021-03-21 DIAGNOSIS — N186 End stage renal disease: Secondary | ICD-10-CM | POA: Diagnosis not present

## 2021-03-21 DIAGNOSIS — T8249XA Other complication of vascular dialysis catheter, initial encounter: Secondary | ICD-10-CM | POA: Diagnosis not present

## 2021-03-21 DIAGNOSIS — Z7401 Bed confinement status: Secondary | ICD-10-CM | POA: Diagnosis not present

## 2021-03-21 DIAGNOSIS — Z992 Dependence on renal dialysis: Secondary | ICD-10-CM | POA: Diagnosis not present

## 2021-03-21 DIAGNOSIS — R531 Weakness: Secondary | ICD-10-CM | POA: Diagnosis not present

## 2021-03-21 DIAGNOSIS — I10 Essential (primary) hypertension: Secondary | ICD-10-CM | POA: Diagnosis not present

## 2021-03-21 DIAGNOSIS — N2581 Secondary hyperparathyroidism of renal origin: Secondary | ICD-10-CM | POA: Diagnosis not present

## 2021-03-22 DIAGNOSIS — I10 Essential (primary) hypertension: Secondary | ICD-10-CM | POA: Diagnosis not present

## 2021-03-23 DIAGNOSIS — I10 Essential (primary) hypertension: Secondary | ICD-10-CM | POA: Diagnosis not present

## 2021-03-24 ENCOUNTER — Telehealth (INDEPENDENT_AMBULATORY_CARE_PROVIDER_SITE_OTHER): Payer: Self-pay

## 2021-03-24 DIAGNOSIS — I10 Essential (primary) hypertension: Secondary | ICD-10-CM | POA: Diagnosis not present

## 2021-03-24 DIAGNOSIS — N186 End stage renal disease: Secondary | ICD-10-CM | POA: Diagnosis not present

## 2021-03-24 DIAGNOSIS — R0689 Other abnormalities of breathing: Secondary | ICD-10-CM | POA: Diagnosis not present

## 2021-03-24 DIAGNOSIS — Z743 Need for continuous supervision: Secondary | ICD-10-CM | POA: Diagnosis not present

## 2021-03-24 DIAGNOSIS — Z992 Dependence on renal dialysis: Secondary | ICD-10-CM | POA: Diagnosis not present

## 2021-03-24 DIAGNOSIS — T8249XA Other complication of vascular dialysis catheter, initial encounter: Secondary | ICD-10-CM | POA: Diagnosis not present

## 2021-03-24 DIAGNOSIS — R519 Headache, unspecified: Secondary | ICD-10-CM | POA: Diagnosis not present

## 2021-03-24 DIAGNOSIS — R5381 Other malaise: Secondary | ICD-10-CM | POA: Diagnosis not present

## 2021-03-24 DIAGNOSIS — R531 Weakness: Secondary | ICD-10-CM | POA: Diagnosis not present

## 2021-03-24 DIAGNOSIS — N2581 Secondary hyperparathyroidism of renal origin: Secondary | ICD-10-CM | POA: Diagnosis not present

## 2021-03-24 DIAGNOSIS — Z7401 Bed confinement status: Secondary | ICD-10-CM | POA: Diagnosis not present

## 2021-03-24 DIAGNOSIS — D688 Other specified coagulation defects: Secondary | ICD-10-CM | POA: Diagnosis not present

## 2021-03-24 NOTE — Telephone Encounter (Signed)
Copied from [place] [id number]. Topic: General - Call Back - No Documentation >> Mar 21, 2021  5:22 PM Pawlus, Brayton Layman A wrote: Reason for CRM: Care Navigation Unit was following up on an order they were supposed to receive for a wheelchair, please advise.

## 2021-03-25 ENCOUNTER — Telehealth: Payer: Self-pay

## 2021-03-25 DIAGNOSIS — I10 Essential (primary) hypertension: Secondary | ICD-10-CM | POA: Diagnosis not present

## 2021-03-26 DIAGNOSIS — D688 Other specified coagulation defects: Secondary | ICD-10-CM | POA: Diagnosis not present

## 2021-03-26 DIAGNOSIS — R531 Weakness: Secondary | ICD-10-CM | POA: Diagnosis not present

## 2021-03-26 DIAGNOSIS — R5381 Other malaise: Secondary | ICD-10-CM | POA: Diagnosis not present

## 2021-03-26 DIAGNOSIS — T8249XA Other complication of vascular dialysis catheter, initial encounter: Secondary | ICD-10-CM | POA: Diagnosis not present

## 2021-03-26 DIAGNOSIS — Z992 Dependence on renal dialysis: Secondary | ICD-10-CM | POA: Diagnosis not present

## 2021-03-26 DIAGNOSIS — Z743 Need for continuous supervision: Secondary | ICD-10-CM | POA: Diagnosis not present

## 2021-03-26 DIAGNOSIS — Z7401 Bed confinement status: Secondary | ICD-10-CM | POA: Diagnosis not present

## 2021-03-26 DIAGNOSIS — R519 Headache, unspecified: Secondary | ICD-10-CM | POA: Diagnosis not present

## 2021-03-26 DIAGNOSIS — N2581 Secondary hyperparathyroidism of renal origin: Secondary | ICD-10-CM | POA: Diagnosis not present

## 2021-03-26 DIAGNOSIS — I10 Essential (primary) hypertension: Secondary | ICD-10-CM | POA: Diagnosis not present

## 2021-03-26 DIAGNOSIS — N186 End stage renal disease: Secondary | ICD-10-CM | POA: Diagnosis not present

## 2021-03-27 ENCOUNTER — Ambulatory Visit (INDEPENDENT_AMBULATORY_CARE_PROVIDER_SITE_OTHER): Payer: Medicare HMO | Admitting: Primary Care

## 2021-03-27 ENCOUNTER — Encounter (INDEPENDENT_AMBULATORY_CARE_PROVIDER_SITE_OTHER): Payer: Self-pay | Admitting: Primary Care

## 2021-03-27 ENCOUNTER — Other Ambulatory Visit: Payer: Self-pay

## 2021-03-27 VITALS — BP 140/93 | HR 101 | Temp 97.3°F | Resp 16

## 2021-03-27 DIAGNOSIS — I129 Hypertensive chronic kidney disease with stage 1 through stage 4 chronic kidney disease, or unspecified chronic kidney disease: Secondary | ICD-10-CM | POA: Diagnosis not present

## 2021-03-27 DIAGNOSIS — R531 Weakness: Secondary | ICD-10-CM | POA: Diagnosis not present

## 2021-03-27 DIAGNOSIS — Z89612 Acquired absence of left leg above knee: Secondary | ICD-10-CM

## 2021-03-27 DIAGNOSIS — Z992 Dependence on renal dialysis: Secondary | ICD-10-CM | POA: Diagnosis not present

## 2021-03-27 DIAGNOSIS — I10 Essential (primary) hypertension: Secondary | ICD-10-CM | POA: Diagnosis not present

## 2021-03-27 DIAGNOSIS — N186 End stage renal disease: Secondary | ICD-10-CM | POA: Diagnosis not present

## 2021-03-27 NOTE — Progress Notes (Signed)
Established Patient Office Visit  Subjective:  Patient ID: [name], male    DOB: 12/26/1980  Age: 40 y.o. MRN: [id number]  CC: No chief complaint on file.   HPI [name] is a 40 year old left knee amputation and presents for difficulty safely moving around environment and independence to do for self if he had a motorized wheelchair this will also limit the use of ambulance for transporting to the doctor visits and dialysis 3 deays a week.  Patient suffers from AKA (left)  which impairs their ability to perform daily activities like bathing, dressing, grooming, and toileting in the home.  A cane and/or walker will not resolve  issue with performing activities of daily living. A wheelchair will allow patient to safely perform daily activities. Patient can safely propel the wheelchair in the home or has a caregiver who can provide assistance.   Accessories: elevating leg rests (ELRs), wheel locks, extensions and anti-tippers. Duration of need: 99 years  Associated Visit Diagnoses: Face to face visit  Past Medical History:  Diagnosis Date   Anemia    Diabetes mellitus without complication ([place])    Diabetic gastroparesis ([place])    Dialysis patient [facility])    Hypertension    Renal disorder    Dialysis   Sepsis [facility])     Past Surgical History:  Procedure Laterality Date   AMPUTATION Right 02/02/2018   Procedure: RIGHT FIFTH TOE AND METATARSAL AMPUTATION. Filetted toe flap metatarsal resection. Debridement Plantar Foot wound;  Surgeon: Evelina Bucy, DPM;  Location: [place];  Service: Podiatry;  Laterality: Right;   AMPUTATION Left 08/20/2018   Procedure: FIFTH METATARSAL BONE BIOPSY;  Surgeon: Evelina Bucy, DPM;  Location: [place];  Service: Podiatry;  Laterality: Left;   AMPUTATION Left 10/28/2018   Procedure: LEFT GREAT TOE AMPUTATION;  Surgeon: Evelina Bucy, DPM;  Location: [place];  Service: Podiatry;  Laterality: Left;   AMPUTATION Left 09/16/2020   Procedure:  AMPUTATION BELOW KNEE;  Surgeon: Rosetta Posner, MD;  Location: [place];  Service: Vascular;  Laterality: Left;   AMPUTATION Left 10/15/2020   Procedure: AMPUTATION ABOVE KNEE;  Surgeon: Elam Dutch, MD;  Location: [place];  Service: Vascular;  Laterality: Left;   APPLICATION OF WOUND VAC  02/02/2018   Procedure: APPLICATION OF WOUND VAC  Right Foot;  Surgeon: Evelina Bucy, DPM;  Location: [place];  Service: Podiatry;;   APPLICATION OF WOUND VAC Left 10/28/2018   Procedure: APPLICATION OF WOUND VAC LEFT TOE;  Surgeon: Evelina Bucy, DPM;  Location: [place];  Service: Podiatry;  Laterality: Left;   APPLICATION OF WOUND VAC Left 11/01/2018   Procedure: APPLICATION OF WOUND VAC;  Surgeon: Evelina Bucy, DPM;  Location: [place];  Service: Podiatry;  Laterality: Left;   AV FISTULA PLACEMENT     left arm.   AV FISTULA PLACEMENT Right 12/22/2016   Procedure: INSERTION OF ARTERIOVENOUS (AV) GORE-TEX GRAFT ARM;  Surgeon: Elam Dutch, MD;  Location: [place];  Service: Vascular;  Laterality: Right;   AV FISTULA PLACEMENT Left 05/26/2018   Procedure: INSERTION OF  ARTERIOVENOUS (AV) GORE-TEX GRAFT LEFT ARM;  Surgeon: Serafina Mitchell, MD;  Location: [place];  Service: Vascular;  Laterality: Left;   EYE SURGERY     I & D EXTREMITY Right 10/31/2017   Procedure: IRRIGATION AND DEBRIDEMENT RIGHT FOOT;  Surgeon: Evelina Bucy, DPM;  Location: [place];  Service: Podiatry;  Laterality: Right;  I & D EXTREMITY Left 08/20/2018   Procedure: IRRIGATION AND DEBRIDEMENT EXTREMITY WITH SECONDARY WOUND CLOSUREAND APPLICATION OF WOUND VAC LEFT FOOT;  Surgeon: Evelina Bucy, DPM;  Location: [place];  Service: Podiatry;  Laterality: Left;   I & D EXTREMITY Left 10/20/2018   Procedure: IRRIGATION AND DEBRIDEMENT LEFT FOOT  DEBRIDEMENT LATERAL FOOT WOUND;  Surgeon: Evelina Bucy, DPM;  Location: [place];  Service: Podiatry;  Laterality: Left;   I & D EXTREMITY Left 10/28/2018   Procedure: IRRIGATION AND DEBRIDEMENT LEFT TOE;   Surgeon: Evelina Bucy, DPM;  Location: [place];  Service: Podiatry;  Laterality: Left;   I & D EXTREMITY Left 09/14/2020   Procedure: IRRIGATION AND DEBRIDEMENT WRIST;  Surgeon: Leanora Cover, MD;  Location: [place];  Service: Orthopedics;  Laterality: Left;   INSERTION OF DIALYSIS CATHETER     Right subclavian   IR AV DIALY SHUNT INTRO NEEDLE/INTRACATH INITIAL W/PTA/IMG RIGHT Right 02/05/2018   IR FLUORO GUIDE CV LINE RIGHT  01/31/2020   IR THROMBECTOMY AV FISTULA W/THROMBOLYSIS/PTA INC/SHUNT/IMG LEFT Left 08/24/2018   IR THROMBECTOMY AV FISTULA W/THROMBOLYSIS/PTA INC/SHUNT/IMG LEFT Left 01/06/2019   IR US GUIDE VASC ACCESS LEFT  08/24/2018   IR US GUIDE VASC ACCESS RIGHT  02/05/2018   IR US GUIDE VASC ACCESS RIGHT  01/31/2020   IRRIGATION AND DEBRIDEMENT FOOT Right 10/23/2018   Procedure: Irrigation and Debridement to tendon, Left Foot;  Surgeon: Evelina Bucy, DPM;  Location: [place];  Service: Podiatry;  Laterality: Right;   IRRIGATION AND DEBRIDEMENT FOOT Left 11/01/2018   Procedure: IRRIGATION AND DEBRIDEMENT PARTIAL WOUND CLOSURE LOCAL TISSUE TRANSFER AND FLAP ROTATION, LEFT FOOT;  Surgeon: Evelina Bucy, DPM;  Location: [place];  Service: Podiatry;  Laterality: Left;   TRANSMETATARSAL AMPUTATION N/A 08/18/2018   Procedure: IRRIGATION AND DEBRIDEMENT OF LEFT 5TH TOE AND TRANSMETATARSAL, WITH PARTICAL LEFT 5TH TOE AND METATARSAL AMPUTATION, BONE BIOPSY, WOUND VAC APPLICATION.;  Surgeon: Evelina Bucy, DPM;  Location: [place];  Service: Podiatry;  Laterality: N/A;   UPPER EXTREMITY VENOGRAPHY N/A 11/16/2016   Procedure: Upper Extremity Venography - Right Central;  Surgeon: Elam Dutch, MD;  Location: [place] [facility];  Service: Cardiovascular;  Laterality: N/A;   UPPER EXTREMITY VENOGRAPHY N/A 05/25/2018   Procedure: UPPER EXTREMITY VENOGRAPHY - Bilateral;  Surgeon: Marty Heck, MD;  Location: [place] [facility];  Service: Cardiovascular;  Laterality: N/A;    Family History   Problem Relation Age of Onset   Diabetes Mellitus II Other    Diabetes Father    Renal Disease Father        ESRD    Social History   Socioeconomic History   Marital status: Single    Spouse name: Not on file   Number of children: Not on file   Years of education: Not on file   Highest education level: Not on file  Occupational History   Not on file  Tobacco Use   Smoking status: Never   Smokeless tobacco: Never  Vaping Use   Vaping Use: Never used  Substance and Sexual Activity   Alcohol use: No   Drug use: No   Sexual activity: Never  Other Topics Concern   Not on file  Social History Narrative   Not on file   Social Determinants of Health   Financial Resource Strain: Not on file  Food Insecurity: Not on file  Transportation Needs: Not on file  Physical Activity: Not on file  Stress:  Not on file  Social Connections: Not on file  Intimate Partner Violence: Not on file    Outpatient Medications Prior to Visit  Medication Sig Dispense Refill   albuterol (VENTOLIN HFA) 108 (90 Base) MCG/ACT inhaler Inhale 2 puffs into the lungs every 6 (six) hours as needed for wheezing or shortness of breath. 6.7 g 0   Blood Glucose Monitoring Suppl (ONETOUCH VERIO FLEX SYSTEM) w/Device KIT 1 Bag by Does not apply route 3 (three) times daily before meals. 1 kit 0   calcitRIOL (ROCALTROL) 0.5 MCG capsule Take 5 capsules (2.5 mcg total) by mouth every Monday, Wednesday, and Friday with hemodialysis. 30 capsule 0   cinacalcet (SENSIPAR) 60 MG tablet Take 120 mg by mouth daily after supper.     gabapentin (NEURONTIN) 100 MG capsule Take 1 capsule (100 mg total) by mouth daily. 30 capsule 1   glucose blood (ONETOUCH VERIO) test strip USE 1 STRIP TO CHECK GLUCOSE THREE TIMES DAILY 100 each 2   Lancets (ONETOUCH DELICA PLUS [id number]) MISC Apply topically.     Methoxy PEG-Epoetin Beta (MIRCERA IJ)      NOVOLOG 100 UNIT/ML injection Inject 6 Units into the skin 3 (three) times daily with  meals. INJECT 6 UNITS SUBCUTANEOUSLY TWICE DAILY WITH A MEAL (Patient taking differently: Inject 6 Units into the skin 2 (two) times daily with a meal.) 10 mL 3   oxyCODONE-acetaminophen (PERCOCET) 5-325 MG tablet Take 1 tablet by mouth every 4 (four) hours as needed for severe pain. 20 tablet 0   sevelamer carbonate (RENVELA) 800 MG tablet Take 2,400-4,000 mg by mouth See admin instructions. Take 4,000 mg (5 tablets) by mouth with meals and 2,400 mg (3 tablets) with snacks     No facility-administered medications prior to visit.    No Known Allergies  ROS Review of Systems  Musculoskeletal:        Unstable gate   All other systems reviewed and are negative.    Objective:    Physical Exam Vitals reviewed.  HENT:     Head: Normocephalic.     Right Ear: Tympanic membrane normal.     Left Ear: Tympanic membrane normal.     Nose: Nose normal.  Eyes:     Extraocular Movements: Extraocular movements intact.     Pupils: Pupils are equal, round, and reactive to light.  Cardiovascular:     Rate and Rhythm: Normal rate and regular rhythm.  Pulmonary:     Effort: Pulmonary effort is normal.     Breath sounds: Normal breath sounds.  Abdominal:     General: Bowel sounds are normal.     Palpations: Abdomen is soft.  Musculoskeletal:     Cervical back: Normal range of motion.  Skin:    General: Skin is warm and dry.  Neurological:     Mental Status: He is alert and oriented to person, place, and time.  Psychiatric:        Mood and Affect: Mood normal.        Behavior: Behavior normal.        Thought Content: Thought content normal.        Judgment: Judgment normal.   BP (!) 140/93   Pulse (!) 101   Temp (!) 97.3 F (36.3 C)   Resp 16   SpO2 98%  Wt Readings from Last 3 Encounters:  03/13/21 170 lb (77.1 kg)  01/23/21 180 lb (81.6 kg)  12/26/20 180 lb (81.6 kg)     Health Maintenance  Due  Topic Date Due   PNEUMOCOCCAL POLYSACCHARIDE VACCINE AGE 90-64 HIGH RISK  Never  done   COVID-19 Vaccine (1) Never done   Pneumococcal Vaccine 78-58 Years old (1 - PCV) Never done   FOOT EXAM  Never done   OPHTHALMOLOGY EXAM  Never done   URINE MICROALBUMIN  Never done   Hepatitis C Screening  Never done   TETANUS/TDAP  Never done   HEMOGLOBIN A1C  03/26/2021    There are no preventive care reminders to display for this patient.  Lab Results  Component Value Date   TSH 1.67 06/28/2014   Lab Results  Component Value Date   WBC 5.5 12/14/2020   HGB 11.5 (L) 12/14/2020   HCT 36.8 (L) 12/14/2020   MCV 87.6 12/14/2020   PLT 159 12/14/2020   Lab Results  Component Value Date   NA 133 (L) 12/14/2020   K 3.6 12/14/2020   CO2 26 12/14/2020   GLUCOSE 119 (H) 12/14/2020   BUN 26 (H) 12/14/2020   CREATININE 8.66 (H) 12/14/2020   BILITOT 0.6 09/22/2020   ALKPHOS 194 (H) 09/22/2020   AST 12 (L) 09/22/2020   ALT 19 09/22/2020   PROT 6.6 09/22/2020   ALBUMIN 2.2 (L) 10/21/2020   CALCIUM 8.5 (L) 12/14/2020   ANIONGAP 14 12/14/2020   No results found for: CHOL No results found for: HDL No results found for: LDLCALC No results found for: TRIG No results found for: CHOLHDL Lab Results  Component Value Date   HGBA1C 7.3 (H) 09/25/2020      Assessment & Plan:   Diagnoses and all orders for this visit:  Hx of above knee amputation, left ([place])  Face to Face DME equipment    Follow-up: No follow-ups on file.    Kerin Perna, NP

## 2021-03-27 NOTE — Telephone Encounter (Signed)
Opened in error

## 2021-03-27 NOTE — Progress Notes (Signed)
Wheelchair  Medication for phantom pain

## 2021-03-28 DIAGNOSIS — Z7401 Bed confinement status: Secondary | ICD-10-CM | POA: Diagnosis not present

## 2021-03-28 DIAGNOSIS — Z992 Dependence on renal dialysis: Secondary | ICD-10-CM | POA: Diagnosis not present

## 2021-03-28 DIAGNOSIS — N186 End stage renal disease: Secondary | ICD-10-CM | POA: Diagnosis not present

## 2021-03-28 DIAGNOSIS — N2581 Secondary hyperparathyroidism of renal origin: Secondary | ICD-10-CM | POA: Diagnosis not present

## 2021-03-28 DIAGNOSIS — I10 Essential (primary) hypertension: Secondary | ICD-10-CM | POA: Diagnosis not present

## 2021-03-28 DIAGNOSIS — R5381 Other malaise: Secondary | ICD-10-CM | POA: Diagnosis not present

## 2021-03-28 DIAGNOSIS — Z743 Need for continuous supervision: Secondary | ICD-10-CM | POA: Diagnosis not present

## 2021-03-28 DIAGNOSIS — D688 Other specified coagulation defects: Secondary | ICD-10-CM | POA: Diagnosis not present

## 2021-03-28 DIAGNOSIS — T8249XA Other complication of vascular dialysis catheter, initial encounter: Secondary | ICD-10-CM | POA: Diagnosis not present

## 2021-03-28 DIAGNOSIS — R531 Weakness: Secondary | ICD-10-CM | POA: Diagnosis not present

## 2021-03-29 DIAGNOSIS — I10 Essential (primary) hypertension: Secondary | ICD-10-CM | POA: Diagnosis not present

## 2021-03-30 DIAGNOSIS — I10 Essential (primary) hypertension: Secondary | ICD-10-CM | POA: Diagnosis not present

## 2021-03-31 DIAGNOSIS — I10 Essential (primary) hypertension: Secondary | ICD-10-CM | POA: Diagnosis not present

## 2021-03-31 DIAGNOSIS — T8249XA Other complication of vascular dialysis catheter, initial encounter: Secondary | ICD-10-CM | POA: Diagnosis not present

## 2021-03-31 DIAGNOSIS — G8911 Acute pain due to trauma: Secondary | ICD-10-CM | POA: Diagnosis not present

## 2021-03-31 DIAGNOSIS — Z743 Need for continuous supervision: Secondary | ICD-10-CM | POA: Diagnosis not present

## 2021-03-31 DIAGNOSIS — R531 Weakness: Secondary | ICD-10-CM | POA: Diagnosis not present

## 2021-03-31 DIAGNOSIS — Z992 Dependence on renal dialysis: Secondary | ICD-10-CM | POA: Diagnosis not present

## 2021-03-31 DIAGNOSIS — D631 Anemia in chronic kidney disease: Secondary | ICD-10-CM | POA: Diagnosis not present

## 2021-03-31 DIAGNOSIS — N2581 Secondary hyperparathyroidism of renal origin: Secondary | ICD-10-CM | POA: Diagnosis not present

## 2021-03-31 DIAGNOSIS — N186 End stage renal disease: Secondary | ICD-10-CM | POA: Diagnosis not present

## 2021-03-31 DIAGNOSIS — R5381 Other malaise: Secondary | ICD-10-CM | POA: Diagnosis not present

## 2021-03-31 DIAGNOSIS — D688 Other specified coagulation defects: Secondary | ICD-10-CM | POA: Diagnosis not present

## 2021-03-31 DIAGNOSIS — Z7401 Bed confinement status: Secondary | ICD-10-CM | POA: Diagnosis not present

## 2021-04-01 DIAGNOSIS — I10 Essential (primary) hypertension: Secondary | ICD-10-CM | POA: Diagnosis not present

## 2021-04-02 ENCOUNTER — Telehealth (INDEPENDENT_AMBULATORY_CARE_PROVIDER_SITE_OTHER): Payer: Self-pay

## 2021-04-02 DIAGNOSIS — G8929 Other chronic pain: Secondary | ICD-10-CM | POA: Diagnosis not present

## 2021-04-02 DIAGNOSIS — N2581 Secondary hyperparathyroidism of renal origin: Secondary | ICD-10-CM | POA: Diagnosis not present

## 2021-04-02 DIAGNOSIS — D631 Anemia in chronic kidney disease: Secondary | ICD-10-CM | POA: Diagnosis not present

## 2021-04-02 DIAGNOSIS — Z7401 Bed confinement status: Secondary | ICD-10-CM | POA: Diagnosis not present

## 2021-04-02 DIAGNOSIS — I10 Essential (primary) hypertension: Secondary | ICD-10-CM | POA: Diagnosis not present

## 2021-04-02 DIAGNOSIS — N186 End stage renal disease: Secondary | ICD-10-CM | POA: Diagnosis not present

## 2021-04-02 DIAGNOSIS — Z743 Need for continuous supervision: Secondary | ICD-10-CM | POA: Diagnosis not present

## 2021-04-02 DIAGNOSIS — R531 Weakness: Secondary | ICD-10-CM | POA: Diagnosis not present

## 2021-04-02 DIAGNOSIS — T8249XA Other complication of vascular dialysis catheter, initial encounter: Secondary | ICD-10-CM | POA: Diagnosis not present

## 2021-04-02 DIAGNOSIS — Z992 Dependence on renal dialysis: Secondary | ICD-10-CM | POA: Diagnosis not present

## 2021-04-02 DIAGNOSIS — R5381 Other malaise: Secondary | ICD-10-CM | POA: Diagnosis not present

## 2021-04-02 DIAGNOSIS — D688 Other specified coagulation defects: Secondary | ICD-10-CM | POA: Diagnosis not present

## 2021-04-02 NOTE — Telephone Encounter (Signed)
Copied from [place] [phone]. Topic: Quick Communication - Rx Refill/Question >> Apr 01, 2021 12:38 PM Erick Blinks wrote: Andria Frames from [facility] called to report that the patient is requesting to increase his dosage for gabapentin. Please advise, wants to speak to nurse first  Best contact: [phone]  Also wants a status update for his wheelchair request

## 2021-04-02 NOTE — Telephone Encounter (Signed)
Copied from [place] [id number]. Topic: General - Other >> Apr 02, 2021  9:12 AM [name] A wrote: Reason for CRM: [name] has made contact regarding referrals for Gastroenterology as well as a wheelchair  The patient's mother would like to discuss these matters further with a member of staff  Please contact to further advise

## 2021-04-02 NOTE — Telephone Encounter (Signed)
Copied from [place] [phone]. Topic: General - Other >> Apr 01, 2021  4:50 PM Tessa Lerner A wrote: Reason for CRM: Altamese Dilling with [id number] has called regarding an update on the status of a referral for patient's wheelchair  Please contact to further advise when possible

## 2021-04-03 ENCOUNTER — Other Ambulatory Visit (INDEPENDENT_AMBULATORY_CARE_PROVIDER_SITE_OTHER): Payer: Self-pay | Admitting: Primary Care

## 2021-04-03 DIAGNOSIS — I10 Essential (primary) hypertension: Secondary | ICD-10-CM | POA: Diagnosis not present

## 2021-04-03 MED ORDER — GABAPENTIN 100 MG PO CAPS
100.0000 mg | ORAL_CAPSULE | Freq: Three times a day (TID) | ORAL | 1 refills | Status: DC
Start: 2021-04-03 — End: 2021-05-05

## 2021-04-03 NOTE — Telephone Encounter (Signed)
Increased gabapentin 100mg  TID

## 2021-04-03 NOTE — Telephone Encounter (Signed)
Altamese Dilling, from [phone], calling in stating that he still has not received this orders for the wheelchair. He states that the pt has come in to sign paperwork and he is confused as well. Please advise.        Callback # [phone] Fax# [phone]

## 2021-04-04 DIAGNOSIS — R531 Weakness: Secondary | ICD-10-CM | POA: Diagnosis not present

## 2021-04-04 DIAGNOSIS — R5381 Other malaise: Secondary | ICD-10-CM | POA: Diagnosis not present

## 2021-04-04 DIAGNOSIS — I10 Essential (primary) hypertension: Secondary | ICD-10-CM | POA: Diagnosis not present

## 2021-04-04 DIAGNOSIS — D631 Anemia in chronic kidney disease: Secondary | ICD-10-CM | POA: Diagnosis not present

## 2021-04-04 DIAGNOSIS — N186 End stage renal disease: Secondary | ICD-10-CM | POA: Diagnosis not present

## 2021-04-04 DIAGNOSIS — Z7401 Bed confinement status: Secondary | ICD-10-CM | POA: Diagnosis not present

## 2021-04-04 DIAGNOSIS — N2581 Secondary hyperparathyroidism of renal origin: Secondary | ICD-10-CM | POA: Diagnosis not present

## 2021-04-04 DIAGNOSIS — Z743 Need for continuous supervision: Secondary | ICD-10-CM | POA: Diagnosis not present

## 2021-04-04 DIAGNOSIS — Z992 Dependence on renal dialysis: Secondary | ICD-10-CM | POA: Diagnosis not present

## 2021-04-04 DIAGNOSIS — T8249XA Other complication of vascular dialysis catheter, initial encounter: Secondary | ICD-10-CM | POA: Diagnosis not present

## 2021-04-04 DIAGNOSIS — D688 Other specified coagulation defects: Secondary | ICD-10-CM | POA: Diagnosis not present

## 2021-04-04 NOTE — Telephone Encounter (Signed)
All forms and documentation has been completed called number listed no answers different options.

## 2021-04-05 DIAGNOSIS — I10 Essential (primary) hypertension: Secondary | ICD-10-CM | POA: Diagnosis not present

## 2021-04-06 DIAGNOSIS — I10 Essential (primary) hypertension: Secondary | ICD-10-CM | POA: Diagnosis not present

## 2021-04-07 ENCOUNTER — Telehealth: Payer: Self-pay | Admitting: Primary Care

## 2021-04-07 ENCOUNTER — Telehealth (INDEPENDENT_AMBULATORY_CARE_PROVIDER_SITE_OTHER): Payer: Self-pay

## 2021-04-07 DIAGNOSIS — E11621 Type 2 diabetes mellitus with foot ulcer: Secondary | ICD-10-CM | POA: Diagnosis not present

## 2021-04-07 DIAGNOSIS — R531 Weakness: Secondary | ICD-10-CM | POA: Diagnosis not present

## 2021-04-07 DIAGNOSIS — Z743 Need for continuous supervision: Secondary | ICD-10-CM | POA: Diagnosis not present

## 2021-04-07 DIAGNOSIS — D631 Anemia in chronic kidney disease: Secondary | ICD-10-CM | POA: Diagnosis not present

## 2021-04-07 DIAGNOSIS — T8249XA Other complication of vascular dialysis catheter, initial encounter: Secondary | ICD-10-CM | POA: Diagnosis not present

## 2021-04-07 DIAGNOSIS — D688 Other specified coagulation defects: Secondary | ICD-10-CM | POA: Diagnosis not present

## 2021-04-07 DIAGNOSIS — S91302A Unspecified open wound, left foot, initial encounter: Secondary | ICD-10-CM | POA: Diagnosis not present

## 2021-04-07 DIAGNOSIS — Z4781 Encounter for orthopedic aftercare following surgical amputation: Secondary | ICD-10-CM | POA: Diagnosis not present

## 2021-04-07 DIAGNOSIS — N2581 Secondary hyperparathyroidism of renal origin: Secondary | ICD-10-CM | POA: Diagnosis not present

## 2021-04-07 DIAGNOSIS — N186 End stage renal disease: Secondary | ICD-10-CM | POA: Diagnosis not present

## 2021-04-07 DIAGNOSIS — E8770 Fluid overload, unspecified: Secondary | ICD-10-CM | POA: Diagnosis not present

## 2021-04-07 DIAGNOSIS — I10 Essential (primary) hypertension: Secondary | ICD-10-CM | POA: Diagnosis not present

## 2021-04-07 DIAGNOSIS — Z992 Dependence on renal dialysis: Secondary | ICD-10-CM | POA: Diagnosis not present

## 2021-04-07 NOTE — Telephone Encounter (Signed)
Copied from [place] [phone]. Topic: General - Other >> Apr 07, 2021  9:12 AM Yvette Rack wrote: Reason for CRM: Altamese Dilling with [id number] called for an update on the order for a wheelchair. Cb# [phone] Ext [phone]

## 2021-04-07 NOTE — Telephone Encounter (Signed)
Copied from [place] [phone]. Topic: General - Other >> Apr 07, 2021  2:24 PM Leward Quan A wrote: Reason for CRM: [place], [place] called in and askied if Juluis Mire can please send Rx electronically for NOVOLOG 100 UNIT/ML injection

## 2021-04-07 NOTE — Telephone Encounter (Signed)
Please contact to provide update on wheelchair order.

## 2021-04-08 ENCOUNTER — Other Ambulatory Visit (INDEPENDENT_AMBULATORY_CARE_PROVIDER_SITE_OTHER): Payer: Self-pay | Admitting: Primary Care

## 2021-04-08 DIAGNOSIS — IMO0002 Reserved for concepts with insufficient information to code with codable children: Secondary | ICD-10-CM

## 2021-04-08 DIAGNOSIS — I10 Essential (primary) hypertension: Secondary | ICD-10-CM | POA: Diagnosis not present

## 2021-04-08 DIAGNOSIS — E1349 Other specified diabetes mellitus with other diabetic neurological complication: Secondary | ICD-10-CM

## 2021-04-08 MED ORDER — INSULIN ASPART 100 UNIT/ML IJ SOLN: 10.0000 [IU] | Freq: Three times a day (TID) | INTRAMUSCULAR | 3 refills | Status: DC

## 2021-04-08 MED ORDER — INSULIN REGULAR HUMAN 100 UNIT/ML IJ SOLN: INTRAMUSCULAR | 3 refills | Status: DC

## 2021-04-08 NOTE — Telephone Encounter (Signed)
Ry with [facility] pharm callback to confirm we had the correct pharm to send medication to

## 2021-04-08 NOTE — Telephone Encounter (Signed)
Please send Rx.

## 2021-04-08 NOTE — Telephone Encounter (Signed)
Ryan, from pharmacy, calling stating that the pt is in desperate need of medication as he is completely out. Please advise.

## 2021-04-08 NOTE — Telephone Encounter (Signed)
Pt is calling and he would like his caregiver to pick up insulin this afternoon at [place]

## 2021-04-09 DIAGNOSIS — R531 Weakness: Secondary | ICD-10-CM | POA: Diagnosis not present

## 2021-04-09 DIAGNOSIS — D631 Anemia in chronic kidney disease: Secondary | ICD-10-CM | POA: Diagnosis not present

## 2021-04-09 DIAGNOSIS — R5381 Other malaise: Secondary | ICD-10-CM | POA: Diagnosis not present

## 2021-04-09 DIAGNOSIS — E8770 Fluid overload, unspecified: Secondary | ICD-10-CM | POA: Diagnosis not present

## 2021-04-09 DIAGNOSIS — N186 End stage renal disease: Secondary | ICD-10-CM | POA: Diagnosis not present

## 2021-04-09 DIAGNOSIS — I10 Essential (primary) hypertension: Secondary | ICD-10-CM | POA: Diagnosis not present

## 2021-04-09 DIAGNOSIS — Z743 Need for continuous supervision: Secondary | ICD-10-CM | POA: Diagnosis not present

## 2021-04-09 DIAGNOSIS — N2581 Secondary hyperparathyroidism of renal origin: Secondary | ICD-10-CM | POA: Diagnosis not present

## 2021-04-09 DIAGNOSIS — Z7401 Bed confinement status: Secondary | ICD-10-CM | POA: Diagnosis not present

## 2021-04-09 DIAGNOSIS — T8249XA Other complication of vascular dialysis catheter, initial encounter: Secondary | ICD-10-CM | POA: Diagnosis not present

## 2021-04-09 DIAGNOSIS — E11621 Type 2 diabetes mellitus with foot ulcer: Secondary | ICD-10-CM | POA: Diagnosis not present

## 2021-04-09 DIAGNOSIS — S8990XA Unspecified injury of unspecified lower leg, initial encounter: Secondary | ICD-10-CM | POA: Diagnosis not present

## 2021-04-09 DIAGNOSIS — Z992 Dependence on renal dialysis: Secondary | ICD-10-CM | POA: Diagnosis not present

## 2021-04-09 DIAGNOSIS — R5383 Other fatigue: Secondary | ICD-10-CM | POA: Diagnosis not present

## 2021-04-09 DIAGNOSIS — D688 Other specified coagulation defects: Secondary | ICD-10-CM | POA: Diagnosis not present

## 2021-04-09 NOTE — Telephone Encounter (Signed)
[name] was following up regarding the wheelchair request, caller asked to fax the order to  [phone] attn. [facility]

## 2021-04-10 DIAGNOSIS — I10 Essential (primary) hypertension: Secondary | ICD-10-CM | POA: Diagnosis not present

## 2021-04-10 NOTE — Telephone Encounter (Signed)
Call returned to Marc/Care [facility]. He explained that he works with patient's dialysis clinic and manages patient's requests. This patient has requested a wheelchair.  There is mention of both manual wheelchair and powerchair. Altamese Dilling stated that the patient would prefer a power chair but that decision is up to the PCP.  The order does not have to be sent to [facility], he said that [facility] would be fine. Informed him that patient's provider will be notified of this call.     Sharyn Lull - Please advise Which type of mobility chair would be best for patient?   Per [facility], your recent office visit note mentions power wheelchair but also states patient can propel wheelchair or has a caregiver to assist and mentions accessories for manual wheelchair. There is no referral to PT Mobility/Seating evaluation in the note. It will also need to document patient's weight.   We need to clarify which type of chair you are ordering. If a power chair, the note needs to be revised to address the power chair.  If a power chair is not appropriate, then we need an order for a manual wheelchair.   thanks

## 2021-04-11 DIAGNOSIS — T8249XA Other complication of vascular dialysis catheter, initial encounter: Secondary | ICD-10-CM | POA: Diagnosis not present

## 2021-04-11 DIAGNOSIS — Z743 Need for continuous supervision: Secondary | ICD-10-CM | POA: Diagnosis not present

## 2021-04-11 DIAGNOSIS — N2581 Secondary hyperparathyroidism of renal origin: Secondary | ICD-10-CM | POA: Diagnosis not present

## 2021-04-11 DIAGNOSIS — E8770 Fluid overload, unspecified: Secondary | ICD-10-CM | POA: Diagnosis not present

## 2021-04-11 DIAGNOSIS — R5381 Other malaise: Secondary | ICD-10-CM | POA: Diagnosis not present

## 2021-04-11 DIAGNOSIS — I10 Essential (primary) hypertension: Secondary | ICD-10-CM | POA: Diagnosis not present

## 2021-04-11 DIAGNOSIS — R531 Weakness: Secondary | ICD-10-CM | POA: Diagnosis not present

## 2021-04-11 DIAGNOSIS — E11621 Type 2 diabetes mellitus with foot ulcer: Secondary | ICD-10-CM | POA: Diagnosis not present

## 2021-04-11 DIAGNOSIS — D631 Anemia in chronic kidney disease: Secondary | ICD-10-CM | POA: Diagnosis not present

## 2021-04-11 DIAGNOSIS — Z992 Dependence on renal dialysis: Secondary | ICD-10-CM | POA: Diagnosis not present

## 2021-04-11 DIAGNOSIS — N186 End stage renal disease: Secondary | ICD-10-CM | POA: Diagnosis not present

## 2021-04-11 DIAGNOSIS — Z7401 Bed confinement status: Secondary | ICD-10-CM | POA: Diagnosis not present

## 2021-04-11 DIAGNOSIS — D688 Other specified coagulation defects: Secondary | ICD-10-CM | POA: Diagnosis not present

## 2021-04-12 DIAGNOSIS — D688 Other specified coagulation defects: Secondary | ICD-10-CM | POA: Diagnosis not present

## 2021-04-12 DIAGNOSIS — Z992 Dependence on renal dialysis: Secondary | ICD-10-CM | POA: Diagnosis not present

## 2021-04-12 DIAGNOSIS — N186 End stage renal disease: Secondary | ICD-10-CM | POA: Diagnosis not present

## 2021-04-12 DIAGNOSIS — I10 Essential (primary) hypertension: Secondary | ICD-10-CM | POA: Diagnosis not present

## 2021-04-12 DIAGNOSIS — I469 Cardiac arrest, cause unspecified: Secondary | ICD-10-CM | POA: Diagnosis not present

## 2021-04-12 DIAGNOSIS — E11621 Type 2 diabetes mellitus with foot ulcer: Secondary | ICD-10-CM | POA: Diagnosis not present

## 2021-04-12 DIAGNOSIS — E8770 Fluid overload, unspecified: Secondary | ICD-10-CM | POA: Diagnosis not present

## 2021-04-12 DIAGNOSIS — Z743 Need for continuous supervision: Secondary | ICD-10-CM | POA: Diagnosis not present

## 2021-04-12 DIAGNOSIS — D631 Anemia in chronic kidney disease: Secondary | ICD-10-CM | POA: Diagnosis not present

## 2021-04-12 DIAGNOSIS — N2581 Secondary hyperparathyroidism of renal origin: Secondary | ICD-10-CM | POA: Diagnosis not present

## 2021-04-12 DIAGNOSIS — Z7401 Bed confinement status: Secondary | ICD-10-CM | POA: Diagnosis not present

## 2021-04-12 DIAGNOSIS — T8249XA Other complication of vascular dialysis catheter, initial encounter: Secondary | ICD-10-CM | POA: Diagnosis not present

## 2021-04-13 DIAGNOSIS — I10 Essential (primary) hypertension: Secondary | ICD-10-CM | POA: Diagnosis not present

## 2021-04-14 DIAGNOSIS — D631 Anemia in chronic kidney disease: Secondary | ICD-10-CM | POA: Diagnosis not present

## 2021-04-14 DIAGNOSIS — D689 Coagulation defect, unspecified: Secondary | ICD-10-CM | POA: Diagnosis not present

## 2021-04-14 DIAGNOSIS — I10 Essential (primary) hypertension: Secondary | ICD-10-CM | POA: Diagnosis not present

## 2021-04-14 DIAGNOSIS — T8249XA Other complication of vascular dialysis catheter, initial encounter: Secondary | ICD-10-CM | POA: Diagnosis not present

## 2021-04-14 DIAGNOSIS — R531 Weakness: Secondary | ICD-10-CM | POA: Diagnosis not present

## 2021-04-14 DIAGNOSIS — Z992 Dependence on renal dialysis: Secondary | ICD-10-CM | POA: Diagnosis not present

## 2021-04-14 DIAGNOSIS — N2581 Secondary hyperparathyroidism of renal origin: Secondary | ICD-10-CM | POA: Diagnosis not present

## 2021-04-14 DIAGNOSIS — R609 Edema, unspecified: Secondary | ICD-10-CM | POA: Diagnosis not present

## 2021-04-14 DIAGNOSIS — D688 Other specified coagulation defects: Secondary | ICD-10-CM | POA: Diagnosis not present

## 2021-04-14 DIAGNOSIS — E875 Hyperkalemia: Secondary | ICD-10-CM | POA: Diagnosis not present

## 2021-04-14 DIAGNOSIS — Z743 Need for continuous supervision: Secondary | ICD-10-CM | POA: Diagnosis not present

## 2021-04-14 DIAGNOSIS — N186 End stage renal disease: Secondary | ICD-10-CM | POA: Diagnosis not present

## 2021-04-14 DIAGNOSIS — Z7401 Bed confinement status: Secondary | ICD-10-CM | POA: Diagnosis not present

## 2021-04-15 ENCOUNTER — Other Ambulatory Visit (INDEPENDENT_AMBULATORY_CARE_PROVIDER_SITE_OTHER): Payer: Self-pay | Admitting: Primary Care

## 2021-04-15 DIAGNOSIS — R32 Unspecified urinary incontinence: Secondary | ICD-10-CM

## 2021-04-15 DIAGNOSIS — Z7401 Bed confinement status: Secondary | ICD-10-CM | POA: Diagnosis not present

## 2021-04-15 DIAGNOSIS — R5381 Other malaise: Secondary | ICD-10-CM | POA: Diagnosis not present

## 2021-04-15 DIAGNOSIS — I10 Essential (primary) hypertension: Secondary | ICD-10-CM | POA: Diagnosis not present

## 2021-04-15 DIAGNOSIS — R159 Full incontinence of feces: Secondary | ICD-10-CM

## 2021-04-15 DIAGNOSIS — G8929 Other chronic pain: Secondary | ICD-10-CM | POA: Diagnosis not present

## 2021-04-15 NOTE — Telephone Encounter (Signed)
You can place an order for a manual wheelchair and it can be sent to [place].  I will cancel the order for the power chair.

## 2021-04-15 NOTE — Telephone Encounter (Signed)
Spoke with patient since he has upper body strength can propel a wheel chair would like a new one

## 2021-04-16 ENCOUNTER — Telehealth (INDEPENDENT_AMBULATORY_CARE_PROVIDER_SITE_OTHER): Payer: Self-pay

## 2021-04-16 DIAGNOSIS — N2581 Secondary hyperparathyroidism of renal origin: Secondary | ICD-10-CM | POA: Diagnosis not present

## 2021-04-16 DIAGNOSIS — N186 End stage renal disease: Secondary | ICD-10-CM | POA: Diagnosis not present

## 2021-04-16 DIAGNOSIS — R5383 Other fatigue: Secondary | ICD-10-CM | POA: Diagnosis not present

## 2021-04-16 DIAGNOSIS — T8249XA Other complication of vascular dialysis catheter, initial encounter: Secondary | ICD-10-CM | POA: Diagnosis not present

## 2021-04-16 DIAGNOSIS — Z743 Need for continuous supervision: Secondary | ICD-10-CM | POA: Diagnosis not present

## 2021-04-16 DIAGNOSIS — Z7401 Bed confinement status: Secondary | ICD-10-CM | POA: Diagnosis not present

## 2021-04-16 DIAGNOSIS — E875 Hyperkalemia: Secondary | ICD-10-CM | POA: Diagnosis not present

## 2021-04-16 DIAGNOSIS — D631 Anemia in chronic kidney disease: Secondary | ICD-10-CM | POA: Diagnosis not present

## 2021-04-16 DIAGNOSIS — I10 Essential (primary) hypertension: Secondary | ICD-10-CM | POA: Diagnosis not present

## 2021-04-16 DIAGNOSIS — I469 Cardiac arrest, cause unspecified: Secondary | ICD-10-CM | POA: Diagnosis not present

## 2021-04-16 DIAGNOSIS — Z992 Dependence on renal dialysis: Secondary | ICD-10-CM | POA: Diagnosis not present

## 2021-04-16 DIAGNOSIS — R5381 Other malaise: Secondary | ICD-10-CM | POA: Diagnosis not present

## 2021-04-16 DIAGNOSIS — D689 Coagulation defect, unspecified: Secondary | ICD-10-CM | POA: Diagnosis not present

## 2021-04-16 DIAGNOSIS — D688 Other specified coagulation defects: Secondary | ICD-10-CM | POA: Diagnosis not present

## 2021-04-16 NOTE — Telephone Encounter (Signed)
Sent to PCP ?

## 2021-04-16 NOTE — Telephone Encounter (Signed)
Copied from [place] [phone]. Topic: General - Call Back - No Documentation >> Apr 16, 2021  1:53 PM [name] wrote: [name] calling from [place] social worker   Best contact: [phone]   Is requesting a call back regarding his motorized scooter

## 2021-04-17 DIAGNOSIS — I10 Essential (primary) hypertension: Secondary | ICD-10-CM | POA: Diagnosis not present

## 2021-04-18 DIAGNOSIS — R531 Weakness: Secondary | ICD-10-CM | POA: Diagnosis not present

## 2021-04-18 DIAGNOSIS — R609 Edema, unspecified: Secondary | ICD-10-CM | POA: Diagnosis not present

## 2021-04-18 DIAGNOSIS — N2581 Secondary hyperparathyroidism of renal origin: Secondary | ICD-10-CM | POA: Diagnosis not present

## 2021-04-18 DIAGNOSIS — Z743 Need for continuous supervision: Secondary | ICD-10-CM | POA: Diagnosis not present

## 2021-04-18 DIAGNOSIS — Z7401 Bed confinement status: Secondary | ICD-10-CM | POA: Diagnosis not present

## 2021-04-18 DIAGNOSIS — D688 Other specified coagulation defects: Secondary | ICD-10-CM | POA: Diagnosis not present

## 2021-04-18 DIAGNOSIS — N186 End stage renal disease: Secondary | ICD-10-CM | POA: Diagnosis not present

## 2021-04-18 DIAGNOSIS — I10 Essential (primary) hypertension: Secondary | ICD-10-CM | POA: Diagnosis not present

## 2021-04-18 DIAGNOSIS — Z992 Dependence on renal dialysis: Secondary | ICD-10-CM | POA: Diagnosis not present

## 2021-04-18 DIAGNOSIS — D631 Anemia in chronic kidney disease: Secondary | ICD-10-CM | POA: Diagnosis not present

## 2021-04-18 DIAGNOSIS — T8249XA Other complication of vascular dialysis catheter, initial encounter: Secondary | ICD-10-CM | POA: Diagnosis not present

## 2021-04-18 DIAGNOSIS — R5381 Other malaise: Secondary | ICD-10-CM | POA: Diagnosis not present

## 2021-04-18 DIAGNOSIS — D689 Coagulation defect, unspecified: Secondary | ICD-10-CM | POA: Diagnosis not present

## 2021-04-18 DIAGNOSIS — E875 Hyperkalemia: Secondary | ICD-10-CM | POA: Diagnosis not present

## 2021-04-19 DIAGNOSIS — I10 Essential (primary) hypertension: Secondary | ICD-10-CM | POA: Diagnosis not present

## 2021-04-19 DIAGNOSIS — D689 Coagulation defect, unspecified: Secondary | ICD-10-CM | POA: Diagnosis not present

## 2021-04-19 DIAGNOSIS — T8249XA Other complication of vascular dialysis catheter, initial encounter: Secondary | ICD-10-CM | POA: Diagnosis not present

## 2021-04-19 DIAGNOSIS — N2581 Secondary hyperparathyroidism of renal origin: Secondary | ICD-10-CM | POA: Diagnosis not present

## 2021-04-19 DIAGNOSIS — E875 Hyperkalemia: Secondary | ICD-10-CM | POA: Diagnosis not present

## 2021-04-19 DIAGNOSIS — D631 Anemia in chronic kidney disease: Secondary | ICD-10-CM | POA: Diagnosis not present

## 2021-04-19 DIAGNOSIS — Z743 Need for continuous supervision: Secondary | ICD-10-CM | POA: Diagnosis not present

## 2021-04-19 DIAGNOSIS — N186 End stage renal disease: Secondary | ICD-10-CM | POA: Diagnosis not present

## 2021-04-19 DIAGNOSIS — R2689 Other abnormalities of gait and mobility: Secondary | ICD-10-CM | POA: Diagnosis not present

## 2021-04-19 DIAGNOSIS — Z992 Dependence on renal dialysis: Secondary | ICD-10-CM | POA: Diagnosis not present

## 2021-04-19 DIAGNOSIS — D688 Other specified coagulation defects: Secondary | ICD-10-CM | POA: Diagnosis not present

## 2021-04-20 DIAGNOSIS — I10 Essential (primary) hypertension: Secondary | ICD-10-CM | POA: Diagnosis not present

## 2021-04-21 ENCOUNTER — Telehealth (INDEPENDENT_AMBULATORY_CARE_PROVIDER_SITE_OTHER): Payer: Self-pay

## 2021-04-21 DIAGNOSIS — N2581 Secondary hyperparathyroidism of renal origin: Secondary | ICD-10-CM | POA: Diagnosis not present

## 2021-04-21 DIAGNOSIS — Z992 Dependence on renal dialysis: Secondary | ICD-10-CM | POA: Diagnosis not present

## 2021-04-21 DIAGNOSIS — D688 Other specified coagulation defects: Secondary | ICD-10-CM | POA: Diagnosis not present

## 2021-04-21 DIAGNOSIS — Z7401 Bed confinement status: Secondary | ICD-10-CM | POA: Diagnosis not present

## 2021-04-21 DIAGNOSIS — T8249XA Other complication of vascular dialysis catheter, initial encounter: Secondary | ICD-10-CM | POA: Diagnosis not present

## 2021-04-21 DIAGNOSIS — R531 Weakness: Secondary | ICD-10-CM | POA: Diagnosis not present

## 2021-04-21 DIAGNOSIS — Z743 Need for continuous supervision: Secondary | ICD-10-CM | POA: Diagnosis not present

## 2021-04-21 DIAGNOSIS — N186 End stage renal disease: Secondary | ICD-10-CM | POA: Diagnosis not present

## 2021-04-21 DIAGNOSIS — D631 Anemia in chronic kidney disease: Secondary | ICD-10-CM | POA: Diagnosis not present

## 2021-04-21 DIAGNOSIS — R5383 Other fatigue: Secondary | ICD-10-CM | POA: Diagnosis not present

## 2021-04-21 DIAGNOSIS — E8770 Fluid overload, unspecified: Secondary | ICD-10-CM | POA: Diagnosis not present

## 2021-04-21 DIAGNOSIS — R5381 Other malaise: Secondary | ICD-10-CM | POA: Diagnosis not present

## 2021-04-21 DIAGNOSIS — I10 Essential (primary) hypertension: Secondary | ICD-10-CM | POA: Diagnosis not present

## 2021-04-21 NOTE — Telephone Encounter (Signed)
Copied from [place] [phone]. Topic: General - Other >> Apr 18, 2021  1:19 PM Pawlus, Apolonio Schneiders wrote: Reason for CRM: Care Navigation Unit called in regarding the pts motorized scooter denial, caller requested a call back. >> Apr 18, 2021  1:25 PM Alanda Slim E wrote: His extension when calling back in [phone]

## 2021-04-21 NOTE — Telephone Encounter (Signed)
Spoke with Education officer, museum stating with dialysis upper body strength will decline and have Md, PA or Np to make a note to reflect this. Will not proceed with wheel chair made RN case manager aware

## 2021-04-21 NOTE — Telephone Encounter (Signed)
Sent to PCP ?

## 2021-04-22 DIAGNOSIS — I10 Essential (primary) hypertension: Secondary | ICD-10-CM | POA: Diagnosis not present

## 2021-04-23 DIAGNOSIS — Z992 Dependence on renal dialysis: Secondary | ICD-10-CM | POA: Diagnosis not present

## 2021-04-23 DIAGNOSIS — N2581 Secondary hyperparathyroidism of renal origin: Secondary | ICD-10-CM | POA: Diagnosis not present

## 2021-04-23 DIAGNOSIS — Z7401 Bed confinement status: Secondary | ICD-10-CM | POA: Diagnosis not present

## 2021-04-23 DIAGNOSIS — R531 Weakness: Secondary | ICD-10-CM | POA: Diagnosis not present

## 2021-04-23 DIAGNOSIS — D631 Anemia in chronic kidney disease: Secondary | ICD-10-CM | POA: Diagnosis not present

## 2021-04-23 DIAGNOSIS — R5381 Other malaise: Secondary | ICD-10-CM | POA: Diagnosis not present

## 2021-04-23 DIAGNOSIS — N186 End stage renal disease: Secondary | ICD-10-CM | POA: Diagnosis not present

## 2021-04-23 DIAGNOSIS — I10 Essential (primary) hypertension: Secondary | ICD-10-CM | POA: Diagnosis not present

## 2021-04-23 DIAGNOSIS — D688 Other specified coagulation defects: Secondary | ICD-10-CM | POA: Diagnosis not present

## 2021-04-23 DIAGNOSIS — Z743 Need for continuous supervision: Secondary | ICD-10-CM | POA: Diagnosis not present

## 2021-04-23 DIAGNOSIS — T8249XA Other complication of vascular dialysis catheter, initial encounter: Secondary | ICD-10-CM | POA: Diagnosis not present

## 2021-04-23 DIAGNOSIS — E8770 Fluid overload, unspecified: Secondary | ICD-10-CM | POA: Diagnosis not present

## 2021-04-24 DIAGNOSIS — I10 Essential (primary) hypertension: Secondary | ICD-10-CM | POA: Diagnosis not present

## 2021-04-24 NOTE — Telephone Encounter (Signed)
Sent to PCP ?

## 2021-04-24 NOTE — Telephone Encounter (Signed)
Caller name:Kay  Relation to pt: social worker from [facility] Call back number: [phone] fax # [phone]    Reason for call:  Checking on the status of script faxed yesterday, will re fax to [phone]. Script requires PCP signature, please fax back. Script is regarding mobility wheelchair

## 2021-04-25 DIAGNOSIS — Z743 Need for continuous supervision: Secondary | ICD-10-CM | POA: Diagnosis not present

## 2021-04-25 DIAGNOSIS — Z7401 Bed confinement status: Secondary | ICD-10-CM | POA: Diagnosis not present

## 2021-04-25 DIAGNOSIS — R5381 Other malaise: Secondary | ICD-10-CM | POA: Diagnosis not present

## 2021-04-25 DIAGNOSIS — N186 End stage renal disease: Secondary | ICD-10-CM | POA: Diagnosis not present

## 2021-04-25 DIAGNOSIS — D688 Other specified coagulation defects: Secondary | ICD-10-CM | POA: Diagnosis not present

## 2021-04-25 DIAGNOSIS — R531 Weakness: Secondary | ICD-10-CM | POA: Diagnosis not present

## 2021-04-25 DIAGNOSIS — T8249XA Other complication of vascular dialysis catheter, initial encounter: Secondary | ICD-10-CM | POA: Diagnosis not present

## 2021-04-25 DIAGNOSIS — Z992 Dependence on renal dialysis: Secondary | ICD-10-CM | POA: Diagnosis not present

## 2021-04-25 DIAGNOSIS — N2581 Secondary hyperparathyroidism of renal origin: Secondary | ICD-10-CM | POA: Diagnosis not present

## 2021-04-25 DIAGNOSIS — R29898 Other symptoms and signs involving the musculoskeletal system: Secondary | ICD-10-CM | POA: Diagnosis not present

## 2021-04-25 DIAGNOSIS — E8770 Fluid overload, unspecified: Secondary | ICD-10-CM | POA: Diagnosis not present

## 2021-04-25 DIAGNOSIS — I10 Essential (primary) hypertension: Secondary | ICD-10-CM | POA: Diagnosis not present

## 2021-04-25 DIAGNOSIS — D631 Anemia in chronic kidney disease: Secondary | ICD-10-CM | POA: Diagnosis not present

## 2021-04-26 DIAGNOSIS — I10 Essential (primary) hypertension: Secondary | ICD-10-CM | POA: Diagnosis not present

## 2021-04-27 DIAGNOSIS — I129 Hypertensive chronic kidney disease with stage 1 through stage 4 chronic kidney disease, or unspecified chronic kidney disease: Secondary | ICD-10-CM | POA: Diagnosis not present

## 2021-04-27 DIAGNOSIS — N186 End stage renal disease: Secondary | ICD-10-CM | POA: Diagnosis not present

## 2021-04-27 DIAGNOSIS — Z992 Dependence on renal dialysis: Secondary | ICD-10-CM | POA: Diagnosis not present

## 2021-04-27 DIAGNOSIS — I10 Essential (primary) hypertension: Secondary | ICD-10-CM | POA: Diagnosis not present

## 2021-04-28 DIAGNOSIS — Z743 Need for continuous supervision: Secondary | ICD-10-CM | POA: Diagnosis not present

## 2021-04-28 DIAGNOSIS — R531 Weakness: Secondary | ICD-10-CM | POA: Diagnosis not present

## 2021-04-28 DIAGNOSIS — Z992 Dependence on renal dialysis: Secondary | ICD-10-CM | POA: Diagnosis not present

## 2021-04-28 DIAGNOSIS — D688 Other specified coagulation defects: Secondary | ICD-10-CM | POA: Diagnosis not present

## 2021-04-28 DIAGNOSIS — R5381 Other malaise: Secondary | ICD-10-CM | POA: Diagnosis not present

## 2021-04-28 DIAGNOSIS — D631 Anemia in chronic kidney disease: Secondary | ICD-10-CM | POA: Diagnosis not present

## 2021-04-28 DIAGNOSIS — Z7401 Bed confinement status: Secondary | ICD-10-CM | POA: Diagnosis not present

## 2021-04-28 DIAGNOSIS — I10 Essential (primary) hypertension: Secondary | ICD-10-CM | POA: Diagnosis not present

## 2021-04-28 DIAGNOSIS — N186 End stage renal disease: Secondary | ICD-10-CM | POA: Diagnosis not present

## 2021-04-28 DIAGNOSIS — N2581 Secondary hyperparathyroidism of renal origin: Secondary | ICD-10-CM | POA: Diagnosis not present

## 2021-04-28 DIAGNOSIS — T8249XA Other complication of vascular dialysis catheter, initial encounter: Secondary | ICD-10-CM | POA: Diagnosis not present

## 2021-04-29 ENCOUNTER — Telehealth: Payer: Self-pay

## 2021-04-29 DIAGNOSIS — I10 Essential (primary) hypertension: Secondary | ICD-10-CM | POA: Diagnosis not present

## 2021-04-29 NOTE — Telephone Encounter (Signed)
Documentation from nephrology supporting the need for power chair was faxed to [place]

## 2021-04-30 DIAGNOSIS — R5381 Other malaise: Secondary | ICD-10-CM | POA: Diagnosis not present

## 2021-04-30 DIAGNOSIS — T8249XA Other complication of vascular dialysis catheter, initial encounter: Secondary | ICD-10-CM | POA: Diagnosis not present

## 2021-04-30 DIAGNOSIS — N2581 Secondary hyperparathyroidism of renal origin: Secondary | ICD-10-CM | POA: Diagnosis not present

## 2021-04-30 DIAGNOSIS — D631 Anemia in chronic kidney disease: Secondary | ICD-10-CM | POA: Diagnosis not present

## 2021-04-30 DIAGNOSIS — R531 Weakness: Secondary | ICD-10-CM | POA: Diagnosis not present

## 2021-04-30 DIAGNOSIS — D688 Other specified coagulation defects: Secondary | ICD-10-CM | POA: Diagnosis not present

## 2021-04-30 DIAGNOSIS — Z7401 Bed confinement status: Secondary | ICD-10-CM | POA: Diagnosis not present

## 2021-04-30 DIAGNOSIS — N186 End stage renal disease: Secondary | ICD-10-CM | POA: Diagnosis not present

## 2021-04-30 DIAGNOSIS — Z743 Need for continuous supervision: Secondary | ICD-10-CM | POA: Diagnosis not present

## 2021-04-30 DIAGNOSIS — I10 Essential (primary) hypertension: Secondary | ICD-10-CM | POA: Diagnosis not present

## 2021-04-30 DIAGNOSIS — Z992 Dependence on renal dialysis: Secondary | ICD-10-CM | POA: Diagnosis not present

## 2021-04-30 NOTE — Telephone Encounter (Signed)
[name] is aware was fax to [facility] on 04-29-2021

## 2021-05-01 ENCOUNTER — Telehealth: Payer: Self-pay

## 2021-05-01 DIAGNOSIS — I10 Essential (primary) hypertension: Secondary | ICD-10-CM | POA: Diagnosis not present

## 2021-05-01 NOTE — Telephone Encounter (Signed)
Message received from [facility] regarding power chair order:  I received your fax from Dialysis center but this is a letter stating patient needs power wheelchair.   Patient is currently being scheduled for a PT Mobility/Seating Evaluation and once that is scheduled and performed we can schedule another Face to Face withprovider.

## 2021-05-02 DIAGNOSIS — Z992 Dependence on renal dialysis: Secondary | ICD-10-CM | POA: Diagnosis not present

## 2021-05-02 DIAGNOSIS — D631 Anemia in chronic kidney disease: Secondary | ICD-10-CM | POA: Diagnosis not present

## 2021-05-02 DIAGNOSIS — N2581 Secondary hyperparathyroidism of renal origin: Secondary | ICD-10-CM | POA: Diagnosis not present

## 2021-05-02 DIAGNOSIS — T8249XA Other complication of vascular dialysis catheter, initial encounter: Secondary | ICD-10-CM | POA: Diagnosis not present

## 2021-05-02 DIAGNOSIS — Z743 Need for continuous supervision: Secondary | ICD-10-CM | POA: Diagnosis not present

## 2021-05-02 DIAGNOSIS — I10 Essential (primary) hypertension: Secondary | ICD-10-CM | POA: Diagnosis not present

## 2021-05-02 DIAGNOSIS — R5381 Other malaise: Secondary | ICD-10-CM | POA: Diagnosis not present

## 2021-05-02 DIAGNOSIS — N186 End stage renal disease: Secondary | ICD-10-CM | POA: Diagnosis not present

## 2021-05-02 DIAGNOSIS — Z7401 Bed confinement status: Secondary | ICD-10-CM | POA: Diagnosis not present

## 2021-05-02 DIAGNOSIS — D688 Other specified coagulation defects: Secondary | ICD-10-CM | POA: Diagnosis not present

## 2021-05-02 DIAGNOSIS — R531 Weakness: Secondary | ICD-10-CM | POA: Diagnosis not present

## 2021-05-03 DIAGNOSIS — I10 Essential (primary) hypertension: Secondary | ICD-10-CM | POA: Diagnosis not present

## 2021-05-04 DIAGNOSIS — I10 Essential (primary) hypertension: Secondary | ICD-10-CM | POA: Diagnosis not present

## 2021-05-05 ENCOUNTER — Other Ambulatory Visit (INDEPENDENT_AMBULATORY_CARE_PROVIDER_SITE_OTHER): Payer: Self-pay | Admitting: Primary Care

## 2021-05-05 DIAGNOSIS — D631 Anemia in chronic kidney disease: Secondary | ICD-10-CM | POA: Diagnosis not present

## 2021-05-05 DIAGNOSIS — N2581 Secondary hyperparathyroidism of renal origin: Secondary | ICD-10-CM | POA: Diagnosis not present

## 2021-05-05 DIAGNOSIS — N186 End stage renal disease: Secondary | ICD-10-CM | POA: Diagnosis not present

## 2021-05-05 DIAGNOSIS — R531 Weakness: Secondary | ICD-10-CM | POA: Diagnosis not present

## 2021-05-05 DIAGNOSIS — E8779 Other fluid overload: Secondary | ICD-10-CM | POA: Diagnosis not present

## 2021-05-05 DIAGNOSIS — R5381 Other malaise: Secondary | ICD-10-CM | POA: Diagnosis not present

## 2021-05-05 DIAGNOSIS — Z992 Dependence on renal dialysis: Secondary | ICD-10-CM | POA: Diagnosis not present

## 2021-05-05 DIAGNOSIS — Z7401 Bed confinement status: Secondary | ICD-10-CM | POA: Diagnosis not present

## 2021-05-05 DIAGNOSIS — T8249XA Other complication of vascular dialysis catheter, initial encounter: Secondary | ICD-10-CM | POA: Diagnosis not present

## 2021-05-05 DIAGNOSIS — D688 Other specified coagulation defects: Secondary | ICD-10-CM | POA: Diagnosis not present

## 2021-05-05 DIAGNOSIS — I10 Essential (primary) hypertension: Secondary | ICD-10-CM | POA: Diagnosis not present

## 2021-05-05 MED ORDER — GABAPENTIN 100 MG PO CAPS: 100.0000 mg | ORAL_CAPSULE | Freq: Three times a day (TID) | ORAL | 1 refills | Status: DC

## 2021-05-05 NOTE — Telephone Encounter (Signed)
Medication Refill - Medication: gabapentin (NEURONTIN) 100 MG capsule   Has the patient contacted their pharmacy? yes (Agent: If no, request that the patient contact the pharmacy for the refill.) (Agent: If yes, when and what did the pharmacy advise?)didn't get an answer  Preferred Pharmacy (with phone number or street name): [place], [address]  Phone:  [phone] Fax:  [phone]   Agent: Please be advised that RX refills may take up to 3 business days. We ask that you follow-up with your pharmacy.

## 2021-05-06 DIAGNOSIS — I10 Essential (primary) hypertension: Secondary | ICD-10-CM | POA: Diagnosis not present

## 2021-05-07 DIAGNOSIS — N2581 Secondary hyperparathyroidism of renal origin: Secondary | ICD-10-CM | POA: Diagnosis not present

## 2021-05-07 DIAGNOSIS — R5381 Other malaise: Secondary | ICD-10-CM | POA: Diagnosis not present

## 2021-05-07 DIAGNOSIS — I10 Essential (primary) hypertension: Secondary | ICD-10-CM | POA: Diagnosis not present

## 2021-05-07 DIAGNOSIS — D688 Other specified coagulation defects: Secondary | ICD-10-CM | POA: Diagnosis not present

## 2021-05-07 DIAGNOSIS — Z7401 Bed confinement status: Secondary | ICD-10-CM | POA: Diagnosis not present

## 2021-05-07 DIAGNOSIS — Z992 Dependence on renal dialysis: Secondary | ICD-10-CM | POA: Diagnosis not present

## 2021-05-07 DIAGNOSIS — D631 Anemia in chronic kidney disease: Secondary | ICD-10-CM | POA: Diagnosis not present

## 2021-05-07 DIAGNOSIS — E8779 Other fluid overload: Secondary | ICD-10-CM | POA: Diagnosis not present

## 2021-05-07 DIAGNOSIS — T8249XA Other complication of vascular dialysis catheter, initial encounter: Secondary | ICD-10-CM | POA: Diagnosis not present

## 2021-05-07 DIAGNOSIS — N186 End stage renal disease: Secondary | ICD-10-CM | POA: Diagnosis not present

## 2021-05-07 DIAGNOSIS — Z743 Need for continuous supervision: Secondary | ICD-10-CM | POA: Diagnosis not present

## 2021-05-07 DIAGNOSIS — R531 Weakness: Secondary | ICD-10-CM | POA: Diagnosis not present

## 2021-05-08 DIAGNOSIS — Z4781 Encounter for orthopedic aftercare following surgical amputation: Secondary | ICD-10-CM | POA: Diagnosis not present

## 2021-05-08 DIAGNOSIS — S91302A Unspecified open wound, left foot, initial encounter: Secondary | ICD-10-CM | POA: Diagnosis not present

## 2021-05-08 DIAGNOSIS — I10 Essential (primary) hypertension: Secondary | ICD-10-CM | POA: Diagnosis not present

## 2021-05-09 DIAGNOSIS — D631 Anemia in chronic kidney disease: Secondary | ICD-10-CM | POA: Diagnosis not present

## 2021-05-09 DIAGNOSIS — D688 Other specified coagulation defects: Secondary | ICD-10-CM | POA: Diagnosis not present

## 2021-05-09 DIAGNOSIS — I10 Essential (primary) hypertension: Secondary | ICD-10-CM | POA: Diagnosis not present

## 2021-05-09 DIAGNOSIS — E8779 Other fluid overload: Secondary | ICD-10-CM | POA: Diagnosis not present

## 2021-05-09 DIAGNOSIS — R531 Weakness: Secondary | ICD-10-CM | POA: Diagnosis not present

## 2021-05-09 DIAGNOSIS — Z992 Dependence on renal dialysis: Secondary | ICD-10-CM | POA: Diagnosis not present

## 2021-05-09 DIAGNOSIS — T8249XA Other complication of vascular dialysis catheter, initial encounter: Secondary | ICD-10-CM | POA: Diagnosis not present

## 2021-05-09 DIAGNOSIS — Z7401 Bed confinement status: Secondary | ICD-10-CM | POA: Diagnosis not present

## 2021-05-09 DIAGNOSIS — R5381 Other malaise: Secondary | ICD-10-CM | POA: Diagnosis not present

## 2021-05-09 DIAGNOSIS — N2581 Secondary hyperparathyroidism of renal origin: Secondary | ICD-10-CM | POA: Diagnosis not present

## 2021-05-09 DIAGNOSIS — N186 End stage renal disease: Secondary | ICD-10-CM | POA: Diagnosis not present

## 2021-05-10 DIAGNOSIS — E8779 Other fluid overload: Secondary | ICD-10-CM | POA: Diagnosis not present

## 2021-05-10 DIAGNOSIS — N2581 Secondary hyperparathyroidism of renal origin: Secondary | ICD-10-CM | POA: Diagnosis not present

## 2021-05-10 DIAGNOSIS — Z7401 Bed confinement status: Secondary | ICD-10-CM | POA: Diagnosis not present

## 2021-05-10 DIAGNOSIS — R1084 Generalized abdominal pain: Secondary | ICD-10-CM | POA: Diagnosis not present

## 2021-05-10 DIAGNOSIS — R5383 Other fatigue: Secondary | ICD-10-CM | POA: Diagnosis not present

## 2021-05-10 DIAGNOSIS — D688 Other specified coagulation defects: Secondary | ICD-10-CM | POA: Diagnosis not present

## 2021-05-10 DIAGNOSIS — D631 Anemia in chronic kidney disease: Secondary | ICD-10-CM | POA: Diagnosis not present

## 2021-05-10 DIAGNOSIS — Z992 Dependence on renal dialysis: Secondary | ICD-10-CM | POA: Diagnosis not present

## 2021-05-10 DIAGNOSIS — Z743 Need for continuous supervision: Secondary | ICD-10-CM | POA: Diagnosis not present

## 2021-05-10 DIAGNOSIS — T8249XA Other complication of vascular dialysis catheter, initial encounter: Secondary | ICD-10-CM | POA: Diagnosis not present

## 2021-05-10 DIAGNOSIS — I10 Essential (primary) hypertension: Secondary | ICD-10-CM | POA: Diagnosis not present

## 2021-05-10 DIAGNOSIS — R10814 Left lower quadrant abdominal tenderness: Secondary | ICD-10-CM | POA: Diagnosis not present

## 2021-05-10 DIAGNOSIS — N186 End stage renal disease: Secondary | ICD-10-CM | POA: Diagnosis not present

## 2021-05-11 DIAGNOSIS — I10 Essential (primary) hypertension: Secondary | ICD-10-CM | POA: Diagnosis not present

## 2021-05-12 DIAGNOSIS — E8779 Other fluid overload: Secondary | ICD-10-CM | POA: Diagnosis not present

## 2021-05-12 DIAGNOSIS — Z743 Need for continuous supervision: Secondary | ICD-10-CM | POA: Diagnosis not present

## 2021-05-12 DIAGNOSIS — R531 Weakness: Secondary | ICD-10-CM | POA: Diagnosis not present

## 2021-05-12 DIAGNOSIS — E11621 Type 2 diabetes mellitus with foot ulcer: Secondary | ICD-10-CM | POA: Diagnosis not present

## 2021-05-12 DIAGNOSIS — D688 Other specified coagulation defects: Secondary | ICD-10-CM | POA: Diagnosis not present

## 2021-05-12 DIAGNOSIS — Z7401 Bed confinement status: Secondary | ICD-10-CM | POA: Diagnosis not present

## 2021-05-12 DIAGNOSIS — N186 End stage renal disease: Secondary | ICD-10-CM | POA: Diagnosis not present

## 2021-05-12 DIAGNOSIS — R69 Illness, unspecified: Secondary | ICD-10-CM | POA: Diagnosis not present

## 2021-05-12 DIAGNOSIS — T8249XA Other complication of vascular dialysis catheter, initial encounter: Secondary | ICD-10-CM | POA: Diagnosis not present

## 2021-05-12 DIAGNOSIS — Z992 Dependence on renal dialysis: Secondary | ICD-10-CM | POA: Diagnosis not present

## 2021-05-12 DIAGNOSIS — D689 Coagulation defect, unspecified: Secondary | ICD-10-CM | POA: Diagnosis not present

## 2021-05-12 DIAGNOSIS — R5381 Other malaise: Secondary | ICD-10-CM | POA: Diagnosis not present

## 2021-05-12 DIAGNOSIS — I10 Essential (primary) hypertension: Secondary | ICD-10-CM | POA: Diagnosis not present

## 2021-05-12 DIAGNOSIS — N2581 Secondary hyperparathyroidism of renal origin: Secondary | ICD-10-CM | POA: Diagnosis not present

## 2021-05-13 DIAGNOSIS — I10 Essential (primary) hypertension: Secondary | ICD-10-CM | POA: Diagnosis not present

## 2021-05-14 DIAGNOSIS — T8249XA Other complication of vascular dialysis catheter, initial encounter: Secondary | ICD-10-CM | POA: Diagnosis not present

## 2021-05-14 DIAGNOSIS — R69 Illness, unspecified: Secondary | ICD-10-CM | POA: Diagnosis not present

## 2021-05-14 DIAGNOSIS — R4182 Altered mental status, unspecified: Secondary | ICD-10-CM | POA: Diagnosis not present

## 2021-05-14 DIAGNOSIS — D688 Other specified coagulation defects: Secondary | ICD-10-CM | POA: Diagnosis not present

## 2021-05-14 DIAGNOSIS — I10 Essential (primary) hypertension: Secondary | ICD-10-CM | POA: Diagnosis not present

## 2021-05-14 DIAGNOSIS — R5381 Other malaise: Secondary | ICD-10-CM | POA: Diagnosis not present

## 2021-05-14 DIAGNOSIS — Z992 Dependence on renal dialysis: Secondary | ICD-10-CM | POA: Diagnosis not present

## 2021-05-14 DIAGNOSIS — Z7401 Bed confinement status: Secondary | ICD-10-CM | POA: Diagnosis not present

## 2021-05-14 DIAGNOSIS — Z743 Need for continuous supervision: Secondary | ICD-10-CM | POA: Diagnosis not present

## 2021-05-14 DIAGNOSIS — E8779 Other fluid overload: Secondary | ICD-10-CM | POA: Diagnosis not present

## 2021-05-14 DIAGNOSIS — E11621 Type 2 diabetes mellitus with foot ulcer: Secondary | ICD-10-CM | POA: Diagnosis not present

## 2021-05-14 DIAGNOSIS — N2581 Secondary hyperparathyroidism of renal origin: Secondary | ICD-10-CM | POA: Diagnosis not present

## 2021-05-14 DIAGNOSIS — N186 End stage renal disease: Secondary | ICD-10-CM | POA: Diagnosis not present

## 2021-05-14 DIAGNOSIS — D689 Coagulation defect, unspecified: Secondary | ICD-10-CM | POA: Diagnosis not present

## 2021-05-15 DIAGNOSIS — I10 Essential (primary) hypertension: Secondary | ICD-10-CM | POA: Diagnosis not present

## 2021-05-16 DIAGNOSIS — N186 End stage renal disease: Secondary | ICD-10-CM | POA: Diagnosis not present

## 2021-05-16 DIAGNOSIS — R531 Weakness: Secondary | ICD-10-CM | POA: Diagnosis not present

## 2021-05-16 DIAGNOSIS — N2581 Secondary hyperparathyroidism of renal origin: Secondary | ICD-10-CM | POA: Diagnosis not present

## 2021-05-16 DIAGNOSIS — D688 Other specified coagulation defects: Secondary | ICD-10-CM | POA: Diagnosis not present

## 2021-05-16 DIAGNOSIS — R5381 Other malaise: Secondary | ICD-10-CM | POA: Diagnosis not present

## 2021-05-16 DIAGNOSIS — Z743 Need for continuous supervision: Secondary | ICD-10-CM | POA: Diagnosis not present

## 2021-05-16 DIAGNOSIS — Z992 Dependence on renal dialysis: Secondary | ICD-10-CM | POA: Diagnosis not present

## 2021-05-16 DIAGNOSIS — D689 Coagulation defect, unspecified: Secondary | ICD-10-CM | POA: Diagnosis not present

## 2021-05-16 DIAGNOSIS — E8779 Other fluid overload: Secondary | ICD-10-CM | POA: Diagnosis not present

## 2021-05-16 DIAGNOSIS — I10 Essential (primary) hypertension: Secondary | ICD-10-CM | POA: Diagnosis not present

## 2021-05-16 DIAGNOSIS — E11621 Type 2 diabetes mellitus with foot ulcer: Secondary | ICD-10-CM | POA: Diagnosis not present

## 2021-05-16 DIAGNOSIS — T8249XA Other complication of vascular dialysis catheter, initial encounter: Secondary | ICD-10-CM | POA: Diagnosis not present

## 2021-05-16 DIAGNOSIS — Z7401 Bed confinement status: Secondary | ICD-10-CM | POA: Diagnosis not present

## 2021-05-17 DIAGNOSIS — N2581 Secondary hyperparathyroidism of renal origin: Secondary | ICD-10-CM | POA: Diagnosis not present

## 2021-05-17 DIAGNOSIS — E8779 Other fluid overload: Secondary | ICD-10-CM | POA: Diagnosis not present

## 2021-05-17 DIAGNOSIS — N186 End stage renal disease: Secondary | ICD-10-CM | POA: Diagnosis not present

## 2021-05-17 DIAGNOSIS — D688 Other specified coagulation defects: Secondary | ICD-10-CM | POA: Diagnosis not present

## 2021-05-17 DIAGNOSIS — T8249XA Other complication of vascular dialysis catheter, initial encounter: Secondary | ICD-10-CM | POA: Diagnosis not present

## 2021-05-17 DIAGNOSIS — Z7401 Bed confinement status: Secondary | ICD-10-CM | POA: Diagnosis not present

## 2021-05-17 DIAGNOSIS — E11621 Type 2 diabetes mellitus with foot ulcer: Secondary | ICD-10-CM | POA: Diagnosis not present

## 2021-05-17 DIAGNOSIS — R5381 Other malaise: Secondary | ICD-10-CM | POA: Diagnosis not present

## 2021-05-17 DIAGNOSIS — D689 Coagulation defect, unspecified: Secondary | ICD-10-CM | POA: Diagnosis not present

## 2021-05-17 DIAGNOSIS — R531 Weakness: Secondary | ICD-10-CM | POA: Diagnosis not present

## 2021-05-17 DIAGNOSIS — I10 Essential (primary) hypertension: Secondary | ICD-10-CM | POA: Diagnosis not present

## 2021-05-17 DIAGNOSIS — Z992 Dependence on renal dialysis: Secondary | ICD-10-CM | POA: Diagnosis not present

## 2021-05-18 DIAGNOSIS — I10 Essential (primary) hypertension: Secondary | ICD-10-CM | POA: Diagnosis not present

## 2021-05-19 ENCOUNTER — Emergency Department (HOSPITAL_COMMUNITY): Payer: MEDICARE

## 2021-05-19 ENCOUNTER — Inpatient Hospital Stay (HOSPITAL_COMMUNITY)
Admission: EM | Admit: 2021-05-19 | Discharge: 2021-05-23 | DRG: 602 | Disposition: A | Discharge status: Home / Self Care | Payer: MEDICARE | Attending: Internal Medicine | Admitting: Internal Medicine

## 2021-05-19 ENCOUNTER — Encounter (HOSPITAL_COMMUNITY): Payer: Self-pay

## 2021-05-19 ENCOUNTER — Other Ambulatory Visit: Payer: Self-pay

## 2021-05-19 DIAGNOSIS — R609 Edema, unspecified: Secondary | ICD-10-CM | POA: Diagnosis not present

## 2021-05-19 DIAGNOSIS — Z794 Long term (current) use of insulin: Secondary | ICD-10-CM | POA: Diagnosis not present

## 2021-05-19 DIAGNOSIS — Z833 Family history of diabetes mellitus: Secondary | ICD-10-CM | POA: Diagnosis not present

## 2021-05-19 DIAGNOSIS — D696 Thrombocytopenia, unspecified: Secondary | ICD-10-CM | POA: Diagnosis not present

## 2021-05-19 DIAGNOSIS — N186 End stage renal disease: Secondary | ICD-10-CM | POA: Diagnosis not present

## 2021-05-19 DIAGNOSIS — Z841 Family history of disorders of kidney and ureter: Secondary | ICD-10-CM

## 2021-05-19 DIAGNOSIS — Z7401 Bed confinement status: Secondary | ICD-10-CM | POA: Diagnosis not present

## 2021-05-19 DIAGNOSIS — E1022 Type 1 diabetes mellitus with diabetic chronic kidney disease: Secondary | ICD-10-CM | POA: Diagnosis not present

## 2021-05-19 DIAGNOSIS — Z9115 Patient's noncompliance with renal dialysis: Secondary | ICD-10-CM

## 2021-05-19 DIAGNOSIS — Z89421 Acquired absence of other right toe(s): Secondary | ICD-10-CM | POA: Diagnosis not present

## 2021-05-19 DIAGNOSIS — Z20822 Contact with and (suspected) exposure to covid-19: Secondary | ICD-10-CM | POA: Diagnosis present

## 2021-05-19 DIAGNOSIS — E11621 Type 2 diabetes mellitus with foot ulcer: Secondary | ICD-10-CM | POA: Diagnosis present

## 2021-05-19 DIAGNOSIS — L03115 Cellulitis of right lower limb: Secondary | ICD-10-CM | POA: Diagnosis not present

## 2021-05-19 DIAGNOSIS — Z992 Dependence on renal dialysis: Secondary | ICD-10-CM

## 2021-05-19 DIAGNOSIS — Z89612 Acquired absence of left leg above knee: Secondary | ICD-10-CM

## 2021-05-19 DIAGNOSIS — K3184 Gastroparesis: Secondary | ICD-10-CM | POA: Diagnosis not present

## 2021-05-19 DIAGNOSIS — Z6831 Body mass index (BMI) 31.0-31.9, adult: Secondary | ICD-10-CM

## 2021-05-19 DIAGNOSIS — Z79899 Other long term (current) drug therapy: Secondary | ICD-10-CM | POA: Diagnosis not present

## 2021-05-19 DIAGNOSIS — N189 Chronic kidney disease, unspecified: Secondary | ICD-10-CM | POA: Diagnosis not present

## 2021-05-19 DIAGNOSIS — E669 Obesity, unspecified: Secondary | ICD-10-CM | POA: Diagnosis present

## 2021-05-19 DIAGNOSIS — R600 Localized edema: Secondary | ICD-10-CM | POA: Diagnosis not present

## 2021-05-19 DIAGNOSIS — I120 Hypertensive chronic kidney disease with stage 5 chronic kidney disease or end stage renal disease: Secondary | ICD-10-CM | POA: Diagnosis present

## 2021-05-19 DIAGNOSIS — L97919 Non-pressure chronic ulcer of unspecified part of right lower leg with unspecified severity: Secondary | ICD-10-CM | POA: Diagnosis not present

## 2021-05-19 DIAGNOSIS — M86161 Other acute osteomyelitis, right tibia and fibula: Secondary | ICD-10-CM | POA: Diagnosis not present

## 2021-05-19 DIAGNOSIS — Z9114 Patient's other noncompliance with medication regimen: Secondary | ICD-10-CM | POA: Diagnosis not present

## 2021-05-19 DIAGNOSIS — D631 Anemia in chronic kidney disease: Secondary | ICD-10-CM | POA: Diagnosis present

## 2021-05-19 DIAGNOSIS — N2581 Secondary hyperparathyroidism of renal origin: Secondary | ICD-10-CM | POA: Diagnosis present

## 2021-05-19 DIAGNOSIS — E108 Type 1 diabetes mellitus with unspecified complications: Secondary | ICD-10-CM | POA: Diagnosis present

## 2021-05-19 DIAGNOSIS — G546 Phantom limb syndrome with pain: Secondary | ICD-10-CM | POA: Diagnosis present

## 2021-05-19 DIAGNOSIS — Z8616 Personal history of COVID-19: Secondary | ICD-10-CM

## 2021-05-19 DIAGNOSIS — E1143 Type 2 diabetes mellitus with diabetic autonomic (poly)neuropathy: Secondary | ICD-10-CM | POA: Diagnosis not present

## 2021-05-19 DIAGNOSIS — E1122 Type 2 diabetes mellitus with diabetic chronic kidney disease: Secondary | ICD-10-CM | POA: Diagnosis present

## 2021-05-19 DIAGNOSIS — E109 Type 1 diabetes mellitus without complications: Secondary | ICD-10-CM | POA: Diagnosis present

## 2021-05-19 DIAGNOSIS — E118 Type 2 diabetes mellitus with unspecified complications: Secondary | ICD-10-CM | POA: Diagnosis present

## 2021-05-19 LAB — CBC WITH DIFFERENTIAL/PLATELET
Abs Immature Granulocytes: 0.03 10*3/uL (ref 0.00–0.07)
Basophils Absolute: 0 10*3/uL (ref 0.0–0.1)
Basophils Relative: 1 %
Eosinophils Absolute: 0.5 10*3/uL (ref 0.0–0.5)
Eosinophils Relative: 7 %
HCT: 34 % — ABNORMAL LOW (ref 39.0–52.0)
Hemoglobin: 10.2 g/dL — ABNORMAL LOW (ref 13.0–17.0)
Immature Granulocytes: 1 %
Lymphocytes Relative: 17 %
Lymphs Abs: 1.1 10*3/uL (ref 0.7–4.0)
MCH: 28.4 pg (ref 26.0–34.0)
MCHC: 30 g/dL (ref 30.0–36.0)
MCV: 94.7 fL (ref 80.0–100.0)
Monocytes Absolute: 0.8 10*3/uL (ref 0.1–1.0)
Monocytes Relative: 12 %
Neutro Abs: 4.1 10*3/uL (ref 1.7–7.7)
Neutrophils Relative %: 62 %
Platelets: 125 10*3/uL — ABNORMAL LOW (ref 150–400)
RBC: 3.59 MIL/uL — ABNORMAL LOW (ref 4.22–5.81)
RDW: 16.6 % — ABNORMAL HIGH (ref 11.5–15.5)
WBC: 6.5 10*3/uL (ref 4.0–10.5)
nRBC: 0 % (ref 0.0–0.2)

## 2021-05-19 LAB — BASIC METABOLIC PANEL
Anion gap: 16 — ABNORMAL HIGH (ref 5–15)
BUN: 77 mg/dL — ABNORMAL HIGH (ref 6–20)
CO2: 22 mmol/L (ref 22–32)
Calcium: 9.8 mg/dL (ref 8.9–10.3)
Chloride: 95 mmol/L — ABNORMAL LOW (ref 98–111)
Creatinine, Ser: 7.08 mg/dL — ABNORMAL HIGH (ref 0.61–1.24)
GFR, Estimated: 9 mL/min — ABNORMAL LOW (ref >60)
Glucose, Bld: 153 mg/dL — ABNORMAL HIGH (ref 70–99)
Potassium: 4.9 mmol/L (ref 3.5–5.1)
Sodium: 133 mmol/L — ABNORMAL LOW (ref 135–145)

## 2021-05-19 LAB — RESP PANEL BY RT-PCR (FLU A&B, COVID) ARPGX2
Influenza A by PCR: NEGATIVE
Influenza B by PCR: NEGATIVE
SARS Coronavirus 2 by RT PCR: NEGATIVE

## 2021-05-19 LAB — CBG MONITORING, ED: Glucose-Capillary: 144 mg/dL — ABNORMAL HIGH (ref 70–99)

## 2021-05-19 LAB — SEDIMENTATION RATE: Sed Rate: 17 mm/hr — ABNORMAL HIGH (ref 0–16)

## 2021-05-19 LAB — C-REACTIVE PROTEIN: CRP: 0.5 mg/dL (ref <1.0)

## 2021-05-19 LAB — HEMOGLOBIN A1C
Hgb A1c MFr Bld: 7.5 % — ABNORMAL HIGH (ref 4.8–5.6)
Mean Plasma Glucose: 168.55 mg/dL

## 2021-05-19 LAB — PREALBUMIN: Prealbumin: 26.8 mg/dL (ref 18–38)

## 2021-05-19 MED ORDER — SEVELAMER CARBONATE 800 MG PO TABS
4000.0000 mg | ORAL_TABLET | Freq: Three times a day (TID) | ORAL | Status: DC
  Administered 2021-05-20 – 2021-05-22 (×4): 4000 mg via ORAL
  Administered 2021-05-23: 1600 mg via ORAL
  Filled 2021-05-19 (×8): qty 5

## 2021-05-19 MED ORDER — ONDANSETRON HCL 4 MG PO TABS: 4.0000 mg | ORAL_TABLET | Freq: Four times a day (QID) | ORAL | Status: DC | PRN

## 2021-05-19 MED ORDER — INSULIN ASPART 100 UNIT/ML IJ SOLN
0.0000 [IU] | Freq: Three times a day (TID) | INTRAMUSCULAR | Status: DC
  Administered 2021-05-20: 9 [IU] via SUBCUTANEOUS
  Administered 2021-05-20: 5 [IU] via SUBCUTANEOUS
  Administered 2021-05-20 – 2021-05-21 (×2): 2 [IU] via SUBCUTANEOUS
  Administered 2021-05-21: 7 [IU] via SUBCUTANEOUS
  Administered 2021-05-22: 5 [IU] via SUBCUTANEOUS
  Administered 2021-05-22: 7 [IU] via SUBCUTANEOUS

## 2021-05-19 MED ORDER — SEVELAMER CARBONATE 800 MG PO TABS
2400.0000 mg | ORAL_TABLET | ORAL | Status: DC
  Administered 2021-05-20 – 2021-05-22 (×3): 2400 mg via ORAL
  Filled 2021-05-19 (×2): qty 3

## 2021-05-19 MED ORDER — OXYCODONE HCL 5 MG PO TABS
10.0000 mg | ORAL_TABLET | Freq: Once | ORAL | Status: AC
  Administered 2021-05-19: 10 mg via ORAL
  Filled 2021-05-19: qty 2

## 2021-05-19 MED ORDER — SODIUM CHLORIDE 0.9 % IV SOLN
2.0000 g | INTRAVENOUS | Status: DC
  Administered 2021-05-19 – 2021-05-22 (×4): 2 g via INTRAVENOUS
  Filled 2021-05-19 (×4): qty 20

## 2021-05-19 MED ORDER — HEPARIN SODIUM (PORCINE) 5000 UNIT/ML IJ SOLN
5000.0000 [IU] | Freq: Three times a day (TID) | INTRAMUSCULAR | Status: DC
  Administered 2021-05-21 – 2021-05-23 (×3): 5000 [IU] via SUBCUTANEOUS
  Filled 2021-05-19 (×9): qty 1

## 2021-05-19 MED ORDER — ONDANSETRON HCL 4 MG/2ML IJ SOLN: 4.0000 mg | Freq: Four times a day (QID) | INTRAMUSCULAR | Status: DC | PRN

## 2021-05-19 MED ORDER — GABAPENTIN 100 MG PO CAPS
100.0000 mg | ORAL_CAPSULE | Freq: Three times a day (TID) | ORAL | Status: DC
  Administered 2021-05-19 – 2021-05-23 (×8): 100 mg via ORAL
  Filled 2021-05-19 (×9): qty 1

## 2021-05-19 MED ORDER — OXYCODONE-ACETAMINOPHEN 5-325 MG PO TABS
1.0000 | ORAL_TABLET | Freq: Four times a day (QID) | ORAL | Status: DC | PRN
  Administered 2021-05-20 – 2021-05-22 (×4): 2 via ORAL
  Filled 2021-05-19 (×4): qty 2

## 2021-05-19 MED ORDER — VANCOMYCIN VARIABLE DOSE PER UNSTABLE RENAL FUNCTION (PHARMACIST DOSING): Status: DC

## 2021-05-19 MED ORDER — CINACALCET HCL 30 MG PO TABS
120.0000 mg | ORAL_TABLET | Freq: Every day | ORAL | Status: DC
  Administered 2021-05-20 – 2021-05-23 (×3): 120 mg via ORAL
  Filled 2021-05-19 (×4): qty 4

## 2021-05-19 MED ORDER — SEVELAMER CARBONATE 800 MG PO TABS: 2400.0000 mg | ORAL_TABLET | ORAL | Status: DC

## 2021-05-19 MED ORDER — METRONIDAZOLE 500 MG PO TABS
500.0000 mg | ORAL_TABLET | Freq: Three times a day (TID) | ORAL | Status: DC
  Administered 2021-05-19 – 2021-05-22 (×8): 500 mg via ORAL
  Filled 2021-05-19 (×8): qty 1

## 2021-05-19 MED ORDER — VANCOMYCIN HCL 1750 MG/350ML IV SOLN
1750.0000 mg | Freq: Once | INTRAVENOUS | Status: AC
  Administered 2021-05-19: 1750 mg via INTRAVENOUS
  Filled 2021-05-19: qty 350

## 2021-05-19 NOTE — Progress Notes (Signed)
Pharmacy Antibiotic Note  [name] is a 40 y.o. male admitted on 05/19/2021 with cellulitis.  Pharmacy has been consulted for vancomycin dosing.  Patient is ESRD on HD MWF. Patient presenting with complaint of wound to right leg. Patient has not been dialyzed today. History of AKA.  Plan: Vancomycin 1750 mg now - subsequent dosing to be determined by HD schedule F/u cultures F/u nephrology recommendations    Temp (24hrs), Avg:98.9 F (37.2 C), Min:98.9 F (37.2 C), Max:98.9 F (37.2 C)  Recent Labs  Lab 05/19/21 1308  WBC 6.5  CREATININE 7.08*    CrCl cannot be calculated (Unknown ideal weight.).    No Known Allergies  Antimicrobials this admission: vancomycin 8/22 >>   Microbiology results: Pending  Thank you for allowing pharmacy to be a part of this patient's care.  Lorelei Pont, PharmD, BCPS 05/19/2021 7:45 PM ED Clinical Pharmacist -  [phone]

## 2021-05-19 NOTE — ED Provider Notes (Signed)
Emergency Medicine Provider Triage Evaluation Note  [name] , a 40 y.o. male  was evaluated in triage.  Pt complains of wound on his right leg.  He says they came to pick him up for dialysis and saw a wound with green.  He says he needs dialysis today also. .  Review of Systems  Positive: Wound on right leg,  Negative: fevers  Physical Exam  There were no vitals taken for this visit. Gen:   Awake, no distress   Resp:  Normal effort  MSK:   Moves remaining extremities without difficulty  Other:  Left aka, right leg with two wounds, no abnormal surrounding induration, no drainage.    Medical Decision Making  Medically screening exam initiated at 12:57 PM.  Appropriate orders placed.  [name] was informed that the remainder of the evaluation will be completed by another provider, this initial triage assessment does not replace that evaluation, and the importance of remaining in the ED until their evaluation is complete.  Note: Portions of this report may have been transcribed using voice recognition software. Every effort was made to ensure accuracy; however, inadvertent computerized transcription errors may be present.     Lorin Glass, [place] 05/19/21 1301    Tegeler, Gwenyth Allegra, MD 05/19/21 [phone]

## 2021-05-19 NOTE — ED Notes (Signed)
Attempted report x1. 

## 2021-05-19 NOTE — ED Notes (Signed)
Pt is requesting pain meds for bilat leg pain. Specifically asked for oxycodone or dilaudid. Provider sent a message at this time

## 2021-05-19 NOTE — ED Notes (Signed)
Attempted to give report x2 

## 2021-05-19 NOTE — ED Triage Notes (Signed)
Patient arrived by [facility] to have right lower leg wounds checked. Ongoing for months and states increased swelling and drainage from same. Patient due for dialysis today and continues to state I need dialysis. I informed patient we will address wounds as that is his complaint and f/u on urgency of dialysis

## 2021-05-19 NOTE — ED Notes (Signed)
Pt stated he is diabetic, this NT offered to check his sugar pt refused.

## 2021-05-19 NOTE — ED Notes (Signed)
Attempted PIV x 2 but was unsuccessful at this time. Lab work was obtained

## 2021-05-19 NOTE — ED Notes (Signed)
Verbal report given to Cecile Sheerer. RN at this time

## 2021-05-19 NOTE — ED Notes (Signed)
Provider at bedside at this time

## 2021-05-19 NOTE — H&P (Signed)
History and Physical    [name] QIH:[id number] DOB: 04-Jan-1981 DOA: 05/19/2021  PCP: Kerin Perna, NP  Patient coming from: Home  I have personally briefly reviewed patient's old medical records in [place]  Chief Complaint: RLE wounds  HPI: [name] is a 40 y.o. male with medical history significant of DM, ESRD on MWF dialysis, LLE AKA.  Pt recieves EMS transport to dialysis.  When EMS picked pt up for dialysis today, they noticed he had sores over his R leg.  Instead of taking him to dialysis, they brought him to ED.  He cannot think of anything that happened to his leg.  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  He thinks he is little bit more swollen than usual or that could be explained by missing dialysis.  No fevers, chills.   ED Course: Pt started on empiric vanc.  MRI pending.   Review of Systems: As per HPI, otherwise all review of systems negative.  Past Medical History:  Diagnosis Date   Anemia    Diabetes mellitus without complication ([place])    Diabetic gastroparesis ([place])    Dialysis patient [facility])    Hypertension    Renal disorder    Dialysis   Sepsis [facility])     Past Surgical History:  Procedure Laterality Date   AMPUTATION Right 02/02/2018   Procedure: RIGHT FIFTH TOE AND METATARSAL AMPUTATION. Filetted toe flap metatarsal resection. Debridement Plantar Foot wound;  Surgeon: Evelina Bucy, DPM;  Location: [place];  Service: Podiatry;  Laterality: Right;   AMPUTATION Left 08/20/2018   Procedure: FIFTH METATARSAL BONE BIOPSY;  Surgeon: Evelina Bucy, DPM;  Location: [place];  Service: Podiatry;  Laterality: Left;   AMPUTATION Left 10/28/2018   Procedure: LEFT GREAT TOE AMPUTATION;  Surgeon: Evelina Bucy, DPM;  Location: [place];  Service: Podiatry;  Laterality: Left;   AMPUTATION Left 09/16/2020   Procedure: AMPUTATION BELOW KNEE;  Surgeon: Rosetta Posner, MD;   Location: [place];  Service: Vascular;  Laterality: Left;   AMPUTATION Left 10/15/2020   Procedure: AMPUTATION ABOVE KNEE;  Surgeon: Elam Dutch, MD;  Location: [place];  Service: Vascular;  Laterality: Left;   APPLICATION OF WOUND VAC  02/02/2018   Procedure: APPLICATION OF WOUND VAC  Right Foot;  Surgeon: Evelina Bucy, DPM;  Location: [place];  Service: Podiatry;;   APPLICATION OF WOUND VAC Left 10/28/2018   Procedure: APPLICATION OF WOUND VAC LEFT TOE;  Surgeon: Evelina Bucy, DPM;  Location: [place];  Service: Podiatry;  Laterality: Left;   APPLICATION OF WOUND VAC Left 11/01/2018   Procedure: APPLICATION OF WOUND VAC;  Surgeon: Evelina Bucy, DPM;  Location: [place];  Service: Podiatry;  Laterality: Left;   AV FISTULA PLACEMENT     left arm.   AV FISTULA PLACEMENT Right 12/22/2016   Procedure: INSERTION OF ARTERIOVENOUS (AV) GORE-TEX GRAFT ARM;  Surgeon: Elam Dutch, MD;  Location: [place];  Service: Vascular;  Laterality: Right;   AV FISTULA PLACEMENT Left 05/26/2018   Procedure: INSERTION OF  ARTERIOVENOUS (AV) GORE-TEX GRAFT LEFT ARM;  Surgeon: Serafina Mitchell, MD;  Location: [place];  Service: Vascular;  Laterality: Left;   EYE SURGERY     I & D EXTREMITY Right 10/31/2017   Procedure: IRRIGATION AND DEBRIDEMENT RIGHT FOOT;  Surgeon: Evelina Bucy, DPM;  Location: [place];  Service: Podiatry;  Laterality: Right;   I & D EXTREMITY Left 08/20/2018   Procedure: IRRIGATION AND DEBRIDEMENT EXTREMITY WITH SECONDARY WOUND CLOSUREAND APPLICATION OF WOUND VAC LEFT FOOT;  Surgeon: Evelina Bucy, DPM;  Location: [place];  Service: Podiatry;  Laterality: Left;   I & D EXTREMITY Left 10/20/2018   Procedure: IRRIGATION AND DEBRIDEMENT LEFT FOOT  DEBRIDEMENT LATERAL FOOT WOUND;  Surgeon: Evelina Bucy, DPM;  Location: [place];  Service: Podiatry;  Laterality: Left;   I & D EXTREMITY Left 10/28/2018   Procedure: IRRIGATION AND DEBRIDEMENT LEFT TOE;  Surgeon: Evelina Bucy, DPM;  Location: [place];   Service: Podiatry;  Laterality: Left;   I & D EXTREMITY Left 09/14/2020   Procedure: IRRIGATION AND DEBRIDEMENT WRIST;  Surgeon: Leanora Cover, MD;  Location: [place];  Service: Orthopedics;  Laterality: Left;   INSERTION OF DIALYSIS CATHETER     Right subclavian   IR AV DIALY SHUNT INTRO NEEDLE/INTRACATH INITIAL W/PTA/IMG RIGHT Right 02/05/2018   IR FLUORO GUIDE CV LINE RIGHT  01/31/2020   IR THROMBECTOMY AV FISTULA W/THROMBOLYSIS/PTA INC/SHUNT/IMG LEFT Left 08/24/2018   IR THROMBECTOMY AV FISTULA W/THROMBOLYSIS/PTA INC/SHUNT/IMG LEFT Left 01/06/2019   IR US GUIDE VASC ACCESS LEFT  08/24/2018   IR US GUIDE VASC ACCESS RIGHT  02/05/2018   IR US GUIDE VASC ACCESS RIGHT  01/31/2020   IRRIGATION AND DEBRIDEMENT FOOT Right 10/23/2018   Procedure: Irrigation and Debridement to tendon, Left Foot;  Surgeon: Evelina Bucy, DPM;  Location: [place];  Service: Podiatry;  Laterality: Right;   IRRIGATION AND DEBRIDEMENT FOOT Left 11/01/2018   Procedure: IRRIGATION AND DEBRIDEMENT PARTIAL WOUND CLOSURE LOCAL TISSUE TRANSFER AND FLAP ROTATION, LEFT FOOT;  Surgeon: Evelina Bucy, DPM;  Location: [place];  Service: Podiatry;  Laterality: Left;   TRANSMETATARSAL AMPUTATION N/A 08/18/2018   Procedure: IRRIGATION AND DEBRIDEMENT OF LEFT 5TH TOE AND TRANSMETATARSAL, WITH PARTICAL LEFT 5TH TOE AND METATARSAL AMPUTATION, BONE BIOPSY, WOUND VAC APPLICATION.;  Surgeon: Evelina Bucy, DPM;  Location: [place];  Service: Podiatry;  Laterality: N/A;   UPPER EXTREMITY VENOGRAPHY N/A 11/16/2016   Procedure: Upper Extremity Venography - Right Central;  Surgeon: Elam Dutch, MD;  Location: [place] CV [facility];  Service: Cardiovascular;  Laterality: N/A;   UPPER EXTREMITY VENOGRAPHY N/A 05/25/2018   Procedure: UPPER EXTREMITY VENOGRAPHY - Bilateral;  Surgeon: Marty Heck, MD;  Location: [place] [facility];  Service: Cardiovascular;  Laterality: N/A;     reports that he has never smoked. He has never used smokeless tobacco.  He reports that he does not drink alcohol and does not use drugs.  No Known Allergies  Family History  Problem Relation Age of Onset   Diabetes Mellitus II Other    Diabetes Father    Renal Disease Father        ESRD     Prior to Admission medications   Medication Sig Start Date End Date Taking? Authorizing Provider  cinacalcet (SENSIPAR) 60 MG tablet Take 120 mg by mouth daily after supper. 04/24/20  Yes [provider]  gabapentin (NEURONTIN) 100 MG capsule Take 1 capsule (100 mg total) by mouth 3 (three) times daily. 05/05/21  Yes Kerin Perna, NP  insulin aspart (NOVOLOG) 100 UNIT/ML injection Inject 10 Units into the skin 3 (three) times daily before meals. For blood sugars 0-150 give 0 units of insulin, 151-200 give 2 units of insulin, 201-250 give 4 units, 251-300 give 6 units, 301-350 give 8 units,  351-400 give 10 units,> 400 give 12 units and call M.D. Discussed hypoglycemia protocol. 04/08/21  Yes Kerin Perna, NP  sevelamer carbonate (RENVELA) 800 MG tablet Take 2,400-4,000 mg by mouth See admin instructions. Take 4,000 mg (5 tablets) by mouth with meals and 2,400 mg (3 tablets) with snacks 04/27/19  Yes [provider]  Blood Glucose Monitoring Suppl ([place]) w/Device KIT 1 Bag by Does not apply route 3 (three) times daily before meals. 05/02/20   Kerin Perna, NP  calcitRIOL (ROCALTROL) 0.5 MCG capsule Take 5 capsules (2.5 mcg total) by mouth every Monday, Wednesday, and Friday with hemodialysis. Patient not taking: Reported on 05/19/2021 10/26/18   Modena Jansky, MD  glucose blood (ONETOUCH VERIO) test strip USE 1 STRIP TO CHECK GLUCOSE THREE TIMES DAILY 03/11/21   Kerin Perna, NP  Lancets [facility] DELICA PLUS [id number]) [place] Apply topically. 05/02/20   [provider]    Physical Exam: Vitals:   05/19/21 1302 05/19/21 1609 05/19/21 2015  BP: (!) 146/96 129/82 (!) 142/106  Pulse: 95 89 87  Resp: '17 18 19   ' Temp: 98.9 F (37.2 C)    TempSrc: Oral    SpO2: 97% 92% 100%    Constitutional: NAD, calm, comfortable Eyes: PERRL, lids and conjunctivae normal ENMT: Mucous membranes are moist. Posterior pharynx clear of any exudate or lesions.Normal dentition.  Neck: normal, supple, no masses, no thyromegaly Respiratory: clear to auscultation bilaterally, no wheezing, no crackles. Normal respiratory effort. No accessory muscle use.  Cardiovascular: Regular rate and rhythm, no murmurs / rubs / gallops. No extremity edema. 2+ pedal pulses. No carotid bruits.  Abdomen: no tenderness, no masses palpated. No hepatosplenomegaly. Bowel sounds positive.  Musculoskeletal: LLE AKA Skin: RLE multiple sores present, appear to be ischemic ulcers.  2cm open sore on proximal aspect of R lower leg that is open.  Erythema and swelling on lateral aspect of R lower leg. Neurologic: CN 2-12 grossly intact. Sensation intact, DTR normal. Strength 5/5 in all 4.  Psychiatric: Normal judgment and insight. Alert and oriented x 3. Normal mood.    Labs on Admission: I have personally reviewed following labs and imaging studies  CBC: Recent Labs  Lab 05/19/21 1308  WBC 6.5  NEUTROABS 4.1  HGB 10.2*  HCT 34.0*  MCV 94.7  PLT 889*   Basic Metabolic Panel: Recent Labs  Lab 05/19/21 1308  NA 133*  K 4.9  CL 95*  CO2 22  GLUCOSE 153*  BUN 77*  CREATININE 7.08*  CALCIUM 9.8   GFR: CrCl cannot be calculated (Unknown ideal weight.). Liver Function Tests: No results for input(s): AST, ALT, ALKPHOS, BILITOT, PROT, ALBUMIN in the last 168 hours. No results for input(s): LIPASE, AMYLASE in the last 168 hours. No results for input(s): AMMONIA in the last 168 hours. Coagulation Profile: No results for input(s): INR, PROTIME in the last 168 hours. Cardiac Enzymes: No results for input(s): CKTOTAL, CKMB, CKMBINDEX, TROPONINI in the last 168 hours. BNP (last 3 results) No results for input(s): PROBNP in the last 8760  hours. HbA1C: Recent Labs    05/19/21 1308  HGBA1C 7.5*   CBG: No results for input(s): GLUCAP in the last 168 hours. Lipid Profile: No results for input(s): CHOL, HDL, LDLCALC, TRIG, CHOLHDL, LDLDIRECT in the last 72 hours. Thyroid Function Tests: No results for input(s): TSH, T4TOTAL, FREET4, T3FREE, THYROIDAB in the last 72 hours. Anemia Panel: No results for input(s): VITAMINB12, FOLATE, FERRITIN, TIBC, IRON, RETICCTPCT in  the last 72 hours. Urine analysis:    Component Value Date/Time   COLORURINE YELLOW 05/26/2015 1115   APPEARANCEUR CLEAR 05/26/2015 1115   APPEARANCEUR Hazy 07/30/2014 2303   LABSPEC 1.016 05/26/2015 1115   LABSPEC 1.012 07/30/2014 2303   PHURINE 8.0 05/26/2015 1115   GLUCOSEU 500 (A) 05/26/2015 1115   GLUCOSEU >=500 07/30/2014 2303   HGBUR MODERATE (A) 05/26/2015 1115   BILIRUBINUR NEGATIVE 05/26/2015 1115   BILIRUBINUR Negative 07/30/2014 2303   KETONESUR 15 (A) 05/26/2015 1115   PROTEINUR >300 (A) 05/26/2015 1115   UROBILINOGEN 0.2 05/26/2015 1115   NITRITE NEGATIVE 05/26/2015 1115   LEUKOCYTESUR NEGATIVE 05/26/2015 1115   LEUKOCYTESUR Negative 07/30/2014 2303    Radiological Exams on Admission: DG Tibia/Fibula Right  Result Date: 05/19/2021 CLINICAL DATA:  Osteomyelitis EXAM: RIGHT TIBIA AND FIBULA - 2 VIEW COMPARISON:  None. FINDINGS: Normal alignment. No fracture or dislocation. No osseous erosion identified. Advanced vascular calcifications are seen within the soft tissues of the right lower extremity and visualized right foot. There is diffuse mild subcutaneous edema of the right lower extremity. Incidentally noted is an os ossific curvilinear density adjacent to the medial femoral condyle in keeping with a Pellegrini-Stieda lesion related to remote MCL injury. IMPRESSION: No osseous erosion identified to suggest osteomyelitis. Electronically Signed   By: Fidela Salisbury M.D.   On: 05/19/2021 20:29    EKG: Independently  reviewed.  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 But did warn patient that it was unlikely they would do dialysis before AM: recently weve ben having difficulty even getting critically ill patients requiring rescue BIPAP dialysis at night. DM - Cont sensitive SSI AC Looks like he doesn't take any long acting insulins at this time.  DVT prophylaxis: SCDs Code Status: Full Family Communication: No family in room Disposition Plan: Home after wound improvement Consults called: None Admission status: Place in 38    Alyzza Andringa, [place] [facility]  How to contact the [facility] Attending or Consulting provider [place] or covering provider during after hours [place], for this patient?  Check the care team in [facility] and look for a) attending/consulting [facility] provider listed and b) the [facility] team listed Log into [email or web address]  Amion Physician Scheduling and messaging for groups and whole hospitals  On call and physician scheduling software for group practices, residents, hospitalists and other medical providers for call, clinic, rotation and shift schedules. On[employer] is a hospital-wide system for scheduling doctors and paging doctors on call.  EasyPlot is for scientific plotting and data analysis.  [email or web address]  and use [place]'s universal password to access. If you do not have the password, please contact the hospital operator.  Locate the [facility] provider you are looking for under  [facility] and page to a number that you can be directly reached. If you still have difficulty reaching the provider, please page the [facility] (Director on Call) for the Hospitalists listed on amion for assistance.  05/19/2021, 9:22 PM

## 2021-05-19 NOTE — ED Provider Notes (Signed)
[facility] DEPARTMENT Provider Note   CSN: [id number] Arrival date & time: 05/19/21  1247     History No chief complaint on file.   [name] is a 40 y.o. male.  [name] has a history of diabetes and end-stage renal disease on dialysis Monday, Wednesday, and Friday.  He receives EMS transport to dialysis, and when they picked him up this morning, they noticed that he had sores over his right lower leg.  Instead of taking him to dialysis, they recommended that he go to the hospital.  He cannot think of anything that happened to his leg.  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  He thinks he is little bit more swollen than usual or that could be explained by missing dialysis.  The history is provided by the patient.  Leg Pain Location:  Leg Injury: no   Leg location:  R lower leg Pain details:    Quality:  Throbbing   Radiates to:  Does not radiate   Severity:  Moderate   Onset quality:  Gradual   Duration:  1 week   Timing:  Constant   Progression:  Worsening Chronicity:  New Relieved by:  Nothing Worsened by:  Nothing Ineffective treatments:  None tried Associated symptoms: swelling   Associated symptoms: no back pain, no fever, no muscle weakness and no tingling       Past Medical History:  Diagnosis Date   Anemia    Diabetes mellitus without complication ([place])    Diabetic gastroparesis ([place])    Dialysis patient [facility])    Hypertension    Renal disorder    Dialysis   Sepsis [facility])     Patient Active Problem List   Diagnosis Date Noted   Severe comorbid illness    Pressure injury of skin 10/15/2020   Wound infection 10/14/2020   Wound dehiscence    Adjustment disorder, unspecified 09/21/2020   Malnutrition of moderate degree 09/13/2020   Osteomyelitis of left foot ([place]) 09/11/2020   Volume overload 07/22/2020   Hypocalcemia 06/07/2020   Diabetic ulcer  of heel ([place]) 05/31/2020   Type 2 diabetes mellitus with foot ulcer ([place]) 04/23/2020   COVID-19 02/20/2020   Personal history of COVID-19 02/20/2020   Diabetic ulcer of left midfoot associated with type 1 diabetes mellitus, with fat layer exposed ([place])    Charcot's joint of foot, left    Pneumonia due to COVID-19 virus 02/14/2020   Cellulitis of left foot 01/31/2020   Allergy, unspecified, initial encounter 07/17/2019   Diarrhea 04/24/2019   Bone erosion determined by x-ray    Encephalopathy acute    Diabetic ulcer of left midfoot associated with diabetes mellitus due to underlying condition, with necrosis of bone ([place])    Open wound of left foot    Encounter for orthopedic aftercare following surgical amputation    Foot infection    Gangrene of toe of left foot ([place])    Cellulitis 10/26/2018   Diabetic ulcer of left midfoot associated with diabetes mellitus due to underlying condition, with bone involvement without evidence of necrosis ([name])    Nausea vomiting and diarrhea 10/20/2018   DM (diabetes mellitus), secondary, uncontrolled, with neurologic complications ([place])    Headache 09/23/2018   Acute osteomyelitis involving ankle and foot, left ([name])    Anemia of chronic disease 08/18/2018   Encounter for immunization 06/29/2018   Hyperkalemia  Unspecified protein-calorie malnutrition ([place]) 02/14/2018   Encounter for removal of sutures 02/07/2018   Fluid overload, unspecified 01/22/2018   Osteomyelitis of ankle or foot, acute, right ([place]) 10/30/2017   Cellulitis and abscess of toe of left foot    Diabetic foot infection ([place])    Insulin dependent type 1 diabetes mellitus ([place]) 10/25/2017   Diabetic gastroparesis ([place]) 10/25/2017   Sepsis ([place]) 10/25/2017   Fever, unspecified 10/28/2016   Iron deficiency anemia, unspecified 10/28/2016   Other specified coagulation defects ([place]) 10/28/2016   Pruritus, unspecified 10/28/2016   Secondary hyperparathyroidism of renal origin ([place])  10/28/2016   Shortness of breath 10/28/2016   HTN (hypertension) 08/09/2014   ESRD on dialysis ([place]) 67/09/4101   Metabolic acidosis 10/28/4386    Past Surgical History:  Procedure Laterality Date   AMPUTATION Right 02/02/2018   Procedure: RIGHT FIFTH TOE AND METATARSAL AMPUTATION. Filetted toe flap metatarsal resection. Debridement Plantar Foot wound;  Surgeon: Evelina Bucy, DPM;  Location: [facility];  Service: Podiatry;  Laterality: Right;   AMPUTATION Left 08/20/2018   Procedure: FIFTH METATARSAL BONE BIOPSY;  Surgeon: Evelina Bucy, DPM;  Location: [facility];  Service: Podiatry;  Laterality: Left;   AMPUTATION Left 10/28/2018   Procedure: LEFT GREAT TOE AMPUTATION;  Surgeon: Evelina Bucy, DPM;  Location: [facility];  Service: Podiatry;  Laterality: Left;   AMPUTATION Left 09/16/2020   Procedure: AMPUTATION BELOW KNEE;  Surgeon: Rosetta Posner, MD;  Location: [facility];  Service: Vascular;  Laterality: Left;   AMPUTATION Left 10/15/2020   Procedure: AMPUTATION ABOVE KNEE;  Surgeon: Elam Dutch, MD;  Location: [facility];  Service: Vascular;  Laterality: Left;   APPLICATION OF WOUND VAC  02/02/2018   Procedure: APPLICATION OF WOUND VAC  Right Foot;  Surgeon: Evelina Bucy, DPM;  Location: [facility];  Service: Podiatry;;   APPLICATION OF WOUND VAC Left 10/28/2018   Procedure: APPLICATION OF WOUND VAC LEFT TOE;  Surgeon: Evelina Bucy, DPM;  Location: [facility];  Service: Podiatry;  Laterality: Left;   APPLICATION OF WOUND VAC Left 11/01/2018   Procedure: APPLICATION OF WOUND VAC;  Surgeon: Evelina Bucy, DPM;  Location: [facility];  Service: Podiatry;  Laterality: Left;   AV FISTULA PLACEMENT     left arm.   AV FISTULA PLACEMENT Right 12/22/2016   Procedure: INSERTION OF ARTERIOVENOUS (AV) GORE-TEX GRAFT ARM;  Surgeon: Elam Dutch, MD;  Location: [facility];  Service: Vascular;  Laterality: Right;   AV FISTULA PLACEMENT Left 05/26/2018   Procedure: INSERTION OF  ARTERIOVENOUS (AV) GORE-TEX GRAFT LEFT ARM;   Surgeon: Serafina Mitchell, MD;  Location: [facility];  Service: Vascular;  Laterality: Left;   EYE SURGERY     I & D EXTREMITY Right 10/31/2017   Procedure: IRRIGATION AND DEBRIDEMENT RIGHT FOOT;  Surgeon: Evelina Bucy, DPM;  Location: [facility];  Service: Podiatry;  Laterality: Right;   I & D EXTREMITY Left 08/20/2018   Procedure: IRRIGATION AND DEBRIDEMENT EXTREMITY WITH SECONDARY WOUND CLOSUREAND APPLICATION OF WOUND VAC LEFT FOOT;  Surgeon: Evelina Bucy, DPM;  Location: [facility];  Service: Podiatry;  Laterality: Left;   I & D EXTREMITY Left 10/20/2018   Procedure: IRRIGATION AND DEBRIDEMENT LEFT FOOT  DEBRIDEMENT LATERAL FOOT WOUND;  Surgeon: Evelina Bucy, DPM;  Location: [facility];  Service: Podiatry;  Laterality: Left;   I & D EXTREMITY Left 10/28/2018   Procedure: IRRIGATION AND DEBRIDEMENT LEFT TOE;  Surgeon: Evelina Bucy, DPM;  Location: [facility];  Service: Podiatry;  Laterality: Left;   I & D EXTREMITY Left 09/14/2020   Procedure: IRRIGATION AND DEBRIDEMENT WRIST;  Surgeon: Leanora Cover, MD;  Location: [facility];  Service: Orthopedics;  Laterality: Left;   INSERTION OF DIALYSIS CATHETER     Right subclavian   IR AV DIALY SHUNT INTRO NEEDLE/INTRACATH INITIAL W/PTA/IMG RIGHT Right 02/05/2018   IR FLUORO GUIDE CV LINE RIGHT  01/31/2020   IR THROMBECTOMY AV FISTULA W/THROMBOLYSIS/PTA INC/SHUNT/IMG LEFT Left 08/24/2018   IR THROMBECTOMY AV FISTULA W/THROMBOLYSIS/PTA INC/SHUNT/IMG LEFT Left 01/06/2019   IR US GUIDE VASC ACCESS LEFT  08/24/2018   IR US GUIDE VASC ACCESS RIGHT  02/05/2018   IR US GUIDE VASC ACCESS RIGHT  01/31/2020   IRRIGATION AND DEBRIDEMENT FOOT Right 10/23/2018   Procedure: Irrigation and Debridement to tendon, Left Foot;  Surgeon: Evelina Bucy, DPM;  Location: [facility];  Service: Podiatry;  Laterality: Right;   IRRIGATION AND DEBRIDEMENT FOOT Left 11/01/2018   Procedure: IRRIGATION AND DEBRIDEMENT PARTIAL WOUND CLOSURE LOCAL TISSUE TRANSFER AND FLAP ROTATION, LEFT FOOT;  Surgeon: Evelina Bucy, DPM;  Location: [facility];  Service: Podiatry;  Laterality: Left;   TRANSMETATARSAL AMPUTATION N/A 08/18/2018   Procedure: IRRIGATION AND DEBRIDEMENT OF LEFT 5TH TOE AND TRANSMETATARSAL, WITH PARTICAL LEFT 5TH TOE AND METATARSAL AMPUTATION, BONE BIOPSY, WOUND VAC APPLICATION.;  Surgeon: Evelina Bucy, DPM;  Location: [facility];  Service: Podiatry;  Laterality: N/A;   UPPER EXTREMITY VENOGRAPHY N/A 11/16/2016   Procedure: Upper Extremity Venography - Right Central;  Surgeon: Elam Dutch, MD;  Location: [facility];  Service: Cardiovascular;  Laterality: N/A;   UPPER EXTREMITY VENOGRAPHY N/A 05/25/2018   Procedure: UPPER EXTREMITY VENOGRAPHY - Bilateral;  Surgeon: Marty Heck, MD;  Location: [facility];  Service: Cardiovascular;  Laterality: N/A;       Family History  Problem Relation Age of Onset   Diabetes Mellitus II Other    Diabetes Father    Renal Disease Father        ESRD    Social History   Tobacco Use   Smoking status: Never   Smokeless tobacco: Never  Vaping Use   Vaping Use: Never used  Substance Use Topics   Alcohol use: No   Drug use: No    Home Medications Prior to Admission medications   Medication Sig Start Date End Date Taking? Authorizing Provider  albuterol (VENTOLIN HFA) 108 (90 Base) MCG/ACT inhaler Inhale 2 puffs into the lungs every 6 (six) hours as needed for wheezing or shortness of breath. 02/18/20   Thurnell Lose, MD  Blood Glucose Monitoring Suppl ([place]) w/Device KIT 1 Bag by Does not apply route 3 (three) times daily before meals. 05/02/20   Kerin Perna, NP  calcitRIOL (ROCALTROL) 0.5 MCG capsule Take 5 capsules (2.5 mcg total) by mouth every Monday, Wednesday, and Friday with hemodialysis. 10/26/18   Hongalgi, Lenis Dickinson, MD  cinacalcet (SENSIPAR) 60 MG tablet Take 120 mg by mouth daily after supper. 04/24/20   [provider]  gabapentin (NEURONTIN) 100 MG capsule Take 1 capsule  (100 mg total) by mouth 3 (three) times daily. 05/05/21   Kerin Perna, NP  glucose blood (ONETOUCH VERIO) test strip USE 1 STRIP TO CHECK GLUCOSE THREE TIMES DAILY 03/11/21   Kerin Perna, NP  insulin aspart (NOVOLOG) 100 UNIT/ML injection Inject 10 Units into the skin 3 (three) times daily before meals. For blood  sugars 0-150 give 0 units of insulin, 151-200 give 2 units of insulin, 201-250 give 4 units, 251-300 give 6 units, 301-350 give 8 units, 351-400 give 10 units,> 400 give 12 units and call M.D. Discussed hypoglycemia protocol. 04/08/21   Kerin Perna, NP  Lancets [employer] DELICA PLUS [id number]) MISC Apply topically. 05/02/20   [provider]  oxyCODONE-acetaminophen (PERCOCET) 5-325 MG tablet Take 1 tablet by mouth every 4 (four) hours as needed for severe pain. 12/14/20 12/14/21  Fransico Meadow, PA-C  sevelamer carbonate (RENVELA) 800 MG tablet Take 2,400-4,000 mg by mouth See admin instructions. Take 4,000 mg (5 tablets) by mouth with meals and 2,400 mg (3 tablets) with snacks 04/27/19   [provider]    Allergies    Patient has no known allergies.  Review of Systems   Review of Systems  Constitutional:  Negative for chills and fever.  HENT:  Negative for ear pain and sore throat.   Eyes:  Negative for pain and visual disturbance.  Respiratory:  Negative for cough and shortness of breath.   Cardiovascular:  Negative for chest pain and palpitations.  Gastrointestinal:  Negative for abdominal pain and vomiting.  Genitourinary:  Negative for dysuria and hematuria.  Musculoskeletal:  Negative for arthralgias and back pain.  Skin:  Positive for wound. Negative for color change and rash.  Neurological:  Negative for seizures and syncope.  All other systems reviewed and are negative.  Physical Exam Updated Vital Signs BP 129/82 (BP Location: Right Arm)   Pulse 89   Temp 98.9 F (37.2 C) (Oral)   Resp 18   SpO2 92%   Physical Exam Vitals and  nursing note reviewed.  Constitutional:      Appearance: Normal appearance.  HENT:     Head: Normocephalic and atraumatic.  Eyes:     Conjunctiva/sclera: Conjunctivae normal.  Pulmonary:     Effort: Pulmonary effort is normal. No respiratory distress.  Musculoskeletal:     Cervical back: Normal range of motion.     Comments: Left AKA.  Right lower extremity is free from deformity with the exception of skin findings.  Dorsalis pedis and posterior tibialis pulses are within normal limits over this extremity.  Compartments are soft.  Skin:    Comments: There are multiple sores over his right lower extremity.  There is 1 on the proximal aspect of his lateral lower leg that is open. It measures about 2 cm in diameter.  There is erythema and swelling along the entire lateral aspect of the right lower leg.  There is also erythema and swelling over the right lateral thigh.    Neurological:     General: No focal deficit present.     Mental Status: He is alert and oriented to person, place, and time. Mental status is at baseline.  Psychiatric:        Mood and Affect: Mood normal.    ED Results / Procedures / Treatments   Labs (all labs ordered are listed, but only abnormal results are displayed) Labs Reviewed  BASIC METABOLIC PANEL - Abnormal; Notable for the following components:      Result Value   Sodium 133 (*)    Chloride 95 (*)    Glucose, Bld 153 (*)    BUN 77 (*)    Creatinine, Ser 7.08 (*)    GFR, Estimated 9 (*)    Anion gap 16 (*)    All other components within normal limits  CBC WITH DIFFERENTIAL/PLATELET -  Abnormal; Notable for the following components:   RBC 3.59 (*)    Hemoglobin 10.2 (*)    HCT 34.0 (*)    RDW 16.6 (*)    Platelets 125 (*)    All other components within normal limits  SEDIMENTATION RATE - Abnormal; Notable for the following components:   Sed Rate 17 (*)    All other components within normal limits  HEMOGLOBIN A1C - Abnormal; Notable for the  following components:   Hgb A1c MFr Bld 7.5 (*)    All other components within normal limits  RESP PANEL BY RT-PCR (FLU A&B, COVID) ARPGX2  C-REACTIVE PROTEIN  PREALBUMIN  CBC  BASIC METABOLIC PANEL    EKG None  Radiology DG Tibia/Fibula Right  Result Date: 05/19/2021 CLINICAL DATA:  Osteomyelitis EXAM: RIGHT TIBIA AND FIBULA - 2 VIEW COMPARISON:  None. FINDINGS: Normal alignment. No fracture or dislocation. No osseous erosion identified. Advanced vascular calcifications are seen within the soft tissues of the right lower extremity and visualized right foot. There is diffuse mild subcutaneous edema of the right lower extremity. Incidentally noted is an os ossific curvilinear density adjacent to the medial femoral condyle in keeping with a Pellegrini-Stieda lesion related to remote MCL injury. IMPRESSION: No osseous erosion identified to suggest osteomyelitis. Electronically Signed   By: Fidela Salisbury M.D.   On: 05/19/2021 20:29    Procedures Procedures   Medications Ordered in ED Medications  vancomycin (VANCOREADY) IVPB 1750 mg/350 mL (1,750 mg Intravenous New Bag/Given 05/19/21 2304)  vancomycin variable dose per unstable renal function (pharmacist dosing) (has no administration in time range)  insulin aspart (novoLOG) injection 0-9 Units (has no administration in time range)  cinacalcet (SENSIPAR) tablet 120 mg (has no administration in time range)  gabapentin (NEURONTIN) capsule 100 mg (100 mg Oral Given 05/19/21 2204)  oxyCODONE-acetaminophen (PERCOCET/ROXICET) 5-325 MG per tablet 1-2 tablet (has no administration in time range)  cefTRIAXone (ROCEPHIN) 2 g in sodium chloride 0.9 % 100 mL IVPB (0 g Intravenous Stopped 05/19/21 2302)  metroNIDAZOLE (FLAGYL) tablet 500 mg (500 mg Oral Given 05/19/21 2204)  heparin injection 5,000 Units (5,000 Units Subcutaneous Patient Refused/Not Given 05/19/21 2204)  ondansetron (ZOFRAN) tablet 4 mg (has no administration in time range)    Or   ondansetron (ZOFRAN) injection 4 mg (has no administration in time range)  sevelamer carbonate (RENVELA) tablet 4,000 mg (has no administration in time range)  sevelamer carbonate (RENVELA) tablet 2,400 mg (has no administration in time range)  oxyCODONE (Oxy IR/ROXICODONE) immediate release tablet 10 mg (10 mg Oral Given 05/19/21 2204)    ED Course  I have reviewed the triage vital signs and the nursing notes.  Pertinent labs & imaging results that were available during my care of the patient were reviewed by me and considered in my medical decision making (see chart for details).  Clinical Course as of 05/19/21 2322  Mon May 19, 2021  2316 I spoke with Dr. Alcario Drought regarding admission.  [AW]    Clinical Course User Index [AW] Arnaldo Natal, MD   MDM Rules/Calculators/A&P                           [name] presented to the hospital with a wound of his right lower leg.  He is at risk for severe complications from infection secondary to his longstanding diabetes, ESRD, and peripheral vascular disease.  In fact, he has had an AKA of the opposite leg.  He was well-appearing without evidence of sepsis.  No evidence of vascular emergency.  Exam was concerning for cellulitis versus deeper infection.  Initial imaging was negative for osteomyelitis, but MRI is pending.  He was given vancomycin and will be admitted to the hospital for further evaluation and treatment. Final Clinical Impression(s) / ED Diagnoses Final diagnoses:  Type 1 diabetes mellitus with chronic kidney disease on chronic dialysis ([place])  Cellulitis of right lower leg    Rx / DC Orders ED Discharge Orders     None        Arnaldo Natal, MD 05/19/21 2324

## 2021-05-20 ENCOUNTER — Encounter (HOSPITAL_COMMUNITY): Payer: Self-pay | Admitting: Internal Medicine

## 2021-05-20 ENCOUNTER — Observation Stay (HOSPITAL_COMMUNITY): Payer: MEDICARE

## 2021-05-20 DIAGNOSIS — Z6831 Body mass index (BMI) 31.0-31.9, adult: Secondary | ICD-10-CM | POA: Diagnosis not present

## 2021-05-20 DIAGNOSIS — E1122 Type 2 diabetes mellitus with diabetic chronic kidney disease: Secondary | ICD-10-CM | POA: Diagnosis not present

## 2021-05-20 DIAGNOSIS — R600 Localized edema: Secondary | ICD-10-CM | POA: Diagnosis not present

## 2021-05-20 DIAGNOSIS — Z20822 Contact with and (suspected) exposure to covid-19: Secondary | ICD-10-CM | POA: Diagnosis not present

## 2021-05-20 DIAGNOSIS — Z992 Dependence on renal dialysis: Secondary | ICD-10-CM | POA: Diagnosis not present

## 2021-05-20 DIAGNOSIS — I120 Hypertensive chronic kidney disease with stage 5 chronic kidney disease or end stage renal disease: Secondary | ICD-10-CM | POA: Diagnosis not present

## 2021-05-20 DIAGNOSIS — E1143 Type 2 diabetes mellitus with diabetic autonomic (poly)neuropathy: Secondary | ICD-10-CM | POA: Diagnosis not present

## 2021-05-20 DIAGNOSIS — K3184 Gastroparesis: Secondary | ICD-10-CM | POA: Diagnosis not present

## 2021-05-20 DIAGNOSIS — Z79899 Other long term (current) drug therapy: Secondary | ICD-10-CM | POA: Diagnosis not present

## 2021-05-20 DIAGNOSIS — E1022 Type 1 diabetes mellitus with diabetic chronic kidney disease: Secondary | ICD-10-CM | POA: Diagnosis not present

## 2021-05-20 DIAGNOSIS — N2581 Secondary hyperparathyroidism of renal origin: Secondary | ICD-10-CM | POA: Diagnosis not present

## 2021-05-20 DIAGNOSIS — E669 Obesity, unspecified: Secondary | ICD-10-CM | POA: Diagnosis not present

## 2021-05-20 DIAGNOSIS — I739 Peripheral vascular disease, unspecified: Secondary | ICD-10-CM | POA: Diagnosis not present

## 2021-05-20 DIAGNOSIS — Z8616 Personal history of COVID-19: Secondary | ICD-10-CM | POA: Diagnosis not present

## 2021-05-20 DIAGNOSIS — E11621 Type 2 diabetes mellitus with foot ulcer: Secondary | ICD-10-CM | POA: Diagnosis not present

## 2021-05-20 DIAGNOSIS — Z841 Family history of disorders of kidney and ureter: Secondary | ICD-10-CM | POA: Diagnosis not present

## 2021-05-20 DIAGNOSIS — L03115 Cellulitis of right lower limb: Secondary | ICD-10-CM | POA: Diagnosis not present

## 2021-05-20 DIAGNOSIS — N186 End stage renal disease: Secondary | ICD-10-CM | POA: Diagnosis not present

## 2021-05-20 DIAGNOSIS — Z89421 Acquired absence of other right toe(s): Secondary | ICD-10-CM | POA: Diagnosis not present

## 2021-05-20 DIAGNOSIS — L97919 Non-pressure chronic ulcer of unspecified part of right lower leg with unspecified severity: Secondary | ICD-10-CM | POA: Diagnosis not present

## 2021-05-20 DIAGNOSIS — G546 Phantom limb syndrome with pain: Secondary | ICD-10-CM | POA: Diagnosis not present

## 2021-05-20 DIAGNOSIS — Z833 Family history of diabetes mellitus: Secondary | ICD-10-CM | POA: Diagnosis not present

## 2021-05-20 DIAGNOSIS — Z89612 Acquired absence of left leg above knee: Secondary | ICD-10-CM | POA: Diagnosis not present

## 2021-05-20 DIAGNOSIS — D631 Anemia in chronic kidney disease: Secondary | ICD-10-CM | POA: Diagnosis not present

## 2021-05-20 DIAGNOSIS — E118 Type 2 diabetes mellitus with unspecified complications: Secondary | ICD-10-CM | POA: Diagnosis not present

## 2021-05-20 DIAGNOSIS — Z9114 Patient's other noncompliance with medication regimen: Secondary | ICD-10-CM | POA: Diagnosis not present

## 2021-05-20 DIAGNOSIS — D696 Thrombocytopenia, unspecified: Secondary | ICD-10-CM | POA: Diagnosis not present

## 2021-05-20 DIAGNOSIS — Z794 Long term (current) use of insulin: Secondary | ICD-10-CM | POA: Diagnosis not present

## 2021-05-20 LAB — CBC
HCT: 33.6 % — ABNORMAL LOW (ref 39.0–52.0)
Hemoglobin: 10 g/dL — ABNORMAL LOW (ref 13.0–17.0)
MCH: 28 pg (ref 26.0–34.0)
MCHC: 29.8 g/dL — ABNORMAL LOW (ref 30.0–36.0)
MCV: 94.1 fL (ref 80.0–100.0)
Platelets: 140 10*3/uL — ABNORMAL LOW (ref 150–400)
RBC: 3.57 MIL/uL — ABNORMAL LOW (ref 4.22–5.81)
RDW: 16.5 % — ABNORMAL HIGH (ref 11.5–15.5)
WBC: 5.5 10*3/uL (ref 4.0–10.5)
nRBC: 0 % (ref 0.0–0.2)

## 2021-05-20 LAB — BASIC METABOLIC PANEL
Anion gap: 16 — ABNORMAL HIGH (ref 5–15)
BUN: 84 mg/dL — ABNORMAL HIGH (ref 6–20)
CO2: 20 mmol/L — ABNORMAL LOW (ref 22–32)
Calcium: 9.3 mg/dL (ref 8.9–10.3)
Chloride: 93 mmol/L — ABNORMAL LOW (ref 98–111)
Creatinine, Ser: 7.51 mg/dL — ABNORMAL HIGH (ref 0.61–1.24)
GFR, Estimated: 9 mL/min — ABNORMAL LOW (ref >60)
Glucose, Bld: 121 mg/dL — ABNORMAL HIGH (ref 70–99)
Potassium: 5.6 mmol/L — ABNORMAL HIGH (ref 3.5–5.1)
Sodium: 129 mmol/L — ABNORMAL LOW (ref 135–145)

## 2021-05-20 LAB — GLUCOSE, CAPILLARY
Glucose-Capillary: 297 mg/dL — ABNORMAL HIGH (ref 70–99)
Glucose-Capillary: 185 mg/dL — ABNORMAL HIGH (ref 70–99)
Glucose-Capillary: 370 mg/dL — ABNORMAL HIGH (ref 70–99)
Glucose-Capillary: 206 mg/dL — ABNORMAL HIGH (ref 70–99)

## 2021-05-20 MED ORDER — HEPARIN SODIUM (PORCINE) 1000 UNIT/ML IJ SOLN
INTRAMUSCULAR | Status: AC
  Filled 2021-05-20: qty 4

## 2021-05-20 MED ORDER — VANCOMYCIN HCL IN DEXTROSE 1-5 GM/200ML-% IV SOLN
1000.0000 mg | INTRAVENOUS | Status: AC
  Administered 2021-05-20: 1000 mg via INTRAVENOUS

## 2021-05-20 MED ORDER — ALBUMIN HUMAN 25 % IV SOLN: 25.0000 g | Freq: Once | INTRAVENOUS | Status: AC

## 2021-05-20 MED ORDER — ALBUMIN HUMAN 25 % IV SOLN
INTRAVENOUS | Status: AC
  Administered 2021-05-20: 25 g via INTRAVENOUS
  Filled 2021-05-20: qty 100

## 2021-05-20 MED ORDER — VANCOMYCIN HCL IN DEXTROSE 1-5 GM/200ML-% IV SOLN
INTRAVENOUS | Status: AC
  Filled 2021-05-20: qty 200

## 2021-05-20 MED ORDER — DARBEPOETIN ALFA 100 MCG/0.5ML IJ SOSY
100.0000 ug | PREFILLED_SYRINGE | INTRAMUSCULAR | Status: DC
  Filled 2021-05-20: qty 0.5

## 2021-05-20 MED ORDER — CHLORHEXIDINE GLUCONATE CLOTH 2 % EX PADS
6.0000 | MEDICATED_PAD | Freq: Every day | CUTANEOUS | Status: DC
  Administered 2021-05-20 – 2021-05-23 (×4): 6 via TOPICAL

## 2021-05-20 MED ORDER — HYDROMORPHONE HCL 1 MG/ML IJ SOLN
1.0000 mg | INTRAMUSCULAR | Status: AC | PRN
  Administered 2021-05-20 – 2021-05-21 (×2): 1 mg via INTRAVENOUS
  Filled 2021-05-20 (×2): qty 1

## 2021-05-20 MED ORDER — HYDROMORPHONE HCL 1 MG/ML IJ SOLN
0.5000 mg | Freq: Once | INTRAMUSCULAR | Status: AC
Start: 2021-05-20 — End: 2021-05-20
  Administered 2021-05-20: 0.5 mg via INTRAVENOUS
  Filled 2021-05-20: qty 0.5

## 2021-05-20 MED ORDER — PROSOURCE PLUS PO LIQD
30.0000 mL | Freq: Three times a day (TID) | ORAL | Status: DC
  Administered 2021-05-20 – 2021-05-23 (×4): 30 mL via ORAL
  Filled 2021-05-20 (×5): qty 30

## 2021-05-20 MED ORDER — NEPRO/CARBSTEADY PO LIQD: 237.0000 mL | Freq: Two times a day (BID) | ORAL | Status: DC

## 2021-05-20 MED ORDER — CALCITRIOL 0.5 MCG PO CAPS
1.5000 ug | ORAL_CAPSULE | ORAL | Status: DC
  Administered 2021-05-23: 1.5 ug via ORAL
  Filled 2021-05-20 (×2): qty 3

## 2021-05-20 MED ORDER — JUVEN PO PACK
1.0000 | PACK | Freq: Two times a day (BID) | ORAL | Status: DC
  Administered 2021-05-21: 1 via ORAL
  Filled 2021-05-20 (×4): qty 1

## 2021-05-20 MED ORDER — HYDROCERIN EX CREA
TOPICAL_CREAM | Freq: Every day | CUTANEOUS | Status: DC
  Filled 2021-05-20: qty 113

## 2021-05-20 MED ORDER — RENA-VITE PO TABS
1.0000 | ORAL_TABLET | Freq: Every day | ORAL | Status: DC
  Administered 2021-05-20 – 2021-05-22 (×3): 1 via ORAL
  Filled 2021-05-20 (×3): qty 1

## 2021-05-20 NOTE — Progress Notes (Signed)
Initial Nutrition Assessment  DOCUMENTATION CODES:  Not applicable  INTERVENTION:  Add Nepro Shake po BID, each supplement provides 425 kcal and 19 grams protein.  Add 30 ml ProSource Plus po TID, each supplement provides 100 kcal and 15 grams of protein.   Add 1 packet Juven BID, each packet provides 95 calories, 2.5 grams of protein (collagen), and 9.8 grams of carbohydrate (3 grams sugar); also contains 7 grams of L-arginine and L-glutamine, 300 mg vitamin C, 15 mg vitamin E, 1.2 mcg vitamin B-12, 9.5 mg zinc, 200 mg calcium, and 1.5 g  Calcium Beta-hydroxy-Beta-methylbutyrate to support wound healing.  Add Rena-Vite daily.  NUTRITION DIAGNOSIS:  Increased nutrient needs related to wound healing as evidenced by estimated needs.  GOAL:  Patient will meet greater than or equal to 90% of their needs  MONITOR:  PO intake, Supplement acceptance, Labs, Weight trends, Skin, I & O's  REASON FOR ASSESSMENT:  Consult Wound healing  ASSESSMENT:  40 yo male with a PMH of T2DM, ESRD on MWF dialysis, and L AKA who presents with cellulitis of the R leg. 8/23 - HD  RD working remotely. Unable to reach patient by phone.  Pt with significant weight increase since previous admission. This is very likely fluid weight as pt has not undergone dialysis.  EDW: 75 kg  Per Nephrology, "per his hospital history adamantly refuses renal diet."  Recommend Nepro shakes BID, ProSource Plus TID, Juven BID, and Rena-Vite daily.  Medications: reviewed; SSI, Flagyl TID, Renvela, Percocet PRN (given once today)  Labs: reviewed; Na 129 (L), K 5.6 (H), CBG 144-297 (H) HbA1c: 7.5% (05/19/2021)  NUTRITION - FOCUSED PHYSICAL EXAM: Unable to perform  Diet Order:   Diet Order             Diet regular Room service appropriate? Yes; Fluid consistency: Thin  Diet effective now                  EDUCATION NEEDS:  No education needs have been identified at this time  Skin:  Skin Assessment: Skin  Integrity Issues: Skin Integrity Issues:: Other (Comment) Other: R leg - cellulitis  Last BM:  05/19/21  Height:  Ht Readings from Last 1 Encounters:  02/13/21 5\' 6"  (1.676 m)   Weight:  Wt Readings from Last 1 Encounters:  05/20/21 95 kg   BMI:  Body mass index is 33.8 kg/m.  Estimated Nutritional Needs:  Kcal:  2100-2300 Protein:  90-105 grams Fluid:  >2.1 L  Derrel Nip, RD, LDN (she/her/hers) Registered Dietitian I After-Hours/Weekend Pager # [phone] [place]

## 2021-05-20 NOTE — Procedures (Signed)
   I was present at this dialysis session, have reviewed the session itself and made  appropriate changes Kelly Splinter MD [place] pager [phone]   05/20/2021, 1:47 PM

## 2021-05-20 NOTE — Progress Notes (Addendum)
Progress Note    [name]  STM:[id number] DOB: [birth year]  DOA: 05/19/2021 PCP: Shane Perna, NP      Brief Narrative:    Medical records reviewed and are as summarized below:  [name] is a 40 y.o. male with medical history significant for ESRD on hemodialysis on Mondays Wednesdays, Fridays) type 2 diabetes mellitus, medical nonadherence, who was brought to the hospital because of right leg wounds.  He was admitted to the hospital for right leg cellulitis.  He was treated with empiric IV antibiotics.  MRI of the leg is pending.   Assessment/Plan:   Principal Problem:   Cellulitis of right leg Active Problems:   Type 2 diabetes mellitus with complication, without long-term current use of insulin ([name])   ESRD on dialysis ([place])    Body mass index is 31.24 kg/m.  (Obesity)  Right leg cellulitis, right leg wounds: Continue empiric IV antibiotics.  Analgesics as needed for pain.  MRI of the right leg is pending.  Appreciate input from wound care nurse.  ESRD: He had hemodialysis today.  Follow-up with nephrologist for subsequent hemodialysis.  He is nonadherent with renal failure diet.  Type 2 diabetes mellitus: NovoLog as needed for hyperglycemia.  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  He has been advised to reschedule the appointment.     Diet Order             Diet regular Room service appropriate? Yes; Fluid consistency: Thin  Diet effective now                      Consultants: Nephrologist  Procedures: None    Medications:    (feeding supplement) PROSource Plus  30 mL Oral TID BM   [START ON 05/21/2021] calcitRIOL  1.5 mcg Oral Q M,W,F-HD   Chlorhexidine Gluconate Cloth  6 each Topical Q0600   cinacalcet  120 mg Oral Q supper   [START ON 05/21/2021] darbepoetin (ARANESP) injection - DIALYSIS  100 mcg Intravenous Q Wed-HD   feeding  supplement (NEPRO CARB STEADY)  237 mL Oral BID BM   gabapentin  100 mg Oral TID   heparin  5,000 Units Subcutaneous Q8H   hydrocerin   Topical Daily   insulin aspart  0-9 Units Subcutaneous TID WC   metroNIDAZOLE  500 mg Oral Q8H   multivitamin  1 tablet Oral QHS   nutrition supplement (JUVEN)  1 packet Oral BID BM   sevelamer carbonate  2,400 mg Oral With snacks   sevelamer carbonate  4,000 mg Oral TID WC   vancomycin variable dose per unstable renal function (pharmacist dosing)   Does not apply See admin instructions   Continuous Infusions:  cefTRIAXone (ROCEPHIN)  IV Stopped (05/19/21 2302)     Anti-infectives (From admission, onward)    Start     Dose/Rate Route Frequency Ordered Stop   05/20/21 1149  vancomycin (VANCOCIN) 1-5 GM/200ML-% IVPB       Note to Pharmacy: Murriel Hopper   : cabinet override      05/20/21 1149 05/20/21 1153   05/20/21 1130  vancomycin (VANCOCIN) IVPB 1000 mg/200 mL premix        1,000 mg 200 mL/hr over 60 Minutes Intravenous To Hemodialysis 05/20/21 1116 05/20/21 1540   05/19/21 2200  metroNIDAZOLE (FLAGYL) tablet 500 mg        500 mg Oral  Every 8 hours 05/19/21 2101 05/26/21 2159   05/19/21 2115  cefTRIAXone (ROCEPHIN) 2 g in sodium chloride 0.9 % 100 mL IVPB        2 g 200 mL/hr over 30 Minutes Intravenous Every 24 hours 05/19/21 2101 05/26/21 2114   05/19/21 1952  vancomycin variable dose per unstable renal function (pharmacist dosing)         Does not apply See admin instructions 05/19/21 1952     05/19/21 1945  vancomycin (VANCOREADY) IVPB 1750 mg/350 mL        1,750 mg 175 mL/hr over 120 Minutes Intravenous  Once 05/19/21 1952 05/20/21 0108              Family Communication/Anticipated D/C date and plan/Code Status   DVT prophylaxis: heparin injection 5,000 Units Start: 05/19/21 2200     Code Status: Full Code  Family Communication: None Disposition Plan:    Status is: Inpatient  Remains inpatient appropriate  because:IV treatments appropriate due to intensity of illness or inability to take PO and Inpatient level of care appropriate due to severity of illness  Dispo: The patient is from: Home              Anticipated d/c is to: Home              Patient currently is not medically stable to d/c.   Difficult to place patient No                 Subjective:   C/o pain in the right leg.  He also requested a regular diet.  He does not want any diabetic or dialysis diet.  Objective:    Vitals:   05/20/21 1215 05/20/21 1230 05/20/21 1235 05/20/21 1308  BP: (!) 90/49 (!) 98/56 (!) 103/52 121/71  Pulse: 84 84 89 87  Resp:  17 17 18   Temp:    98.6 F (37 C)  TempSrc:    Oral  SpO2:   98% 100%  Weight:  87.8 kg     No data found.   Intake/Output Summary (Last 24 hours) at 05/20/2021 1652 Last data filed at 05/20/2021 1500 Gross per 24 hour  Intake 322.2 ml  Output 5103 ml  Net -4780.8 ml   Filed Weights   05/20/21 0840 05/20/21 1230  Weight: 95 kg 87.8 kg    Exam:  GEN: NAD SKIN: Wounds on right posterior leg and lateral aspect of right leg. EYES: No pallor or icterus ENT: MMM CV: RRR PULM: CTA B ABD: soft, ND, NT, +BS CNS: AAO x 3, non focal EXT: Left AKA.  Right leg tenderness.            Data Reviewed:   I have personally reviewed following labs and imaging studies:  Labs: Labs show the following:   Basic Metabolic Panel: Recent Labs  Lab 05/19/21 1308 05/20/21 0149  NA 133* 129*  K 4.9 5.6*  CL 95* 93*  CO2 22 20*  GLUCOSE 153* 121*  BUN 77* 84*  CREATININE 7.08* 7.51*  CALCIUM 9.8 9.3   GFR Estimated Creatinine Clearance: 13.4 mL/min (A) (by C-G formula based on SCr of 7.51 mg/dL (H)). Liver Function Tests: No results for input(s): AST, ALT, ALKPHOS, BILITOT, PROT, ALBUMIN in the last 168 hours. No results for input(s): LIPASE, AMYLASE in the last 168 hours. No results for input(s): AMMONIA in the last 168 hours. Coagulation  profile No results for input(s): INR, PROTIME in the last 168 hours.  CBC: Recent Labs  Lab 05/19/21 1308 05/20/21 0149  WBC 6.5 5.5  NEUTROABS 4.1  --   HGB 10.2* 10.0*  HCT 34.0* 33.6*  MCV 94.7 94.1  PLT 125* 140*   Cardiac Enzymes: No results for input(s): CKTOTAL, CKMB, CKMBINDEX, TROPONINI in the last 168 hours. BNP (last 3 results) No results for input(s): PROBNP in the last 8760 hours. CBG: Recent Labs  Lab 05/19/21 2343 05/20/21 0759 05/20/21 1305 05/20/21 1634  GLUCAP 144* 297* 185* 370*   D-Dimer: No results for input(s): DDIMER in the last 72 hours. Hgb A1c: Recent Labs    05/19/21 1308  HGBA1C 7.5*   Lipid Profile: No results for input(s): CHOL, HDL, LDLCALC, TRIG, CHOLHDL, LDLDIRECT in the last 72 hours. Thyroid function studies: No results for input(s): TSH, T4TOTAL, T3FREE, THYROIDAB in the last 72 hours.  Invalid input(s): FREET3 Anemia work up: No results for input(s): VITAMINB12, FOLATE, FERRITIN, TIBC, IRON, RETICCTPCT in the last 72 hours. Sepsis Labs: Recent Labs  Lab 05/19/21 1308 05/20/21 0149  WBC 6.5 5.5    Microbiology Recent Results (from the past 240 hour(s))  Resp Panel by RT-PCR (Flu A&B, Covid) Nasopharyngeal Swab     Status: None   Collection Time: 05/19/21  8:50 PM   Specimen: Nasopharyngeal Swab; Nasopharyngeal(NP) swabs in vial transport medium  Result Value Ref Range Status   SARS Coronavirus 2 by RT PCR NEGATIVE NEGATIVE Final    Comment: (NOTE) SARS-CoV-2 target nucleic acids are NOT DETECTED.  The SARS-CoV-2 RNA is generally detectable in upper respiratory specimens during the acute phase of infection. The lowest concentration of SARS-CoV-2 viral copies this assay can detect is 138 copies/mL. A negative result does not preclude SARS-Cov-2 infection and should not be used as the sole basis for treatment or other patient management decisions. A negative result may occur with  improper specimen  collection/handling, submission of specimen other than nasopharyngeal swab, presence of viral mutation(s) within the areas targeted by this assay, and inadequate number of viral copies(<138 copies/mL). A negative result must be combined with clinical observations, patient history, and epidemiological information. The expected result is Negative.  Fact Sheet for Patients:  EntrepreneurPulse.com.au  Fact Sheet for Healthcare Providers:  IncredibleEmployment.be  This test is no t yet approved or cleared by the [place] FDA and  has been authorized for detection and/or diagnosis of SARS-CoV-2 by FDA under an Emergency Use Authorization (EUA). This EUA will remain  in effect (meaning this test can be used) for the duration of the COVID-19 declaration under Section 564(b)(1) of the Act, 21 U.S.C.section 360bbb-3(b)(1), unless the authorization is terminated  or revoked sooner.       Influenza A by PCR NEGATIVE NEGATIVE Final   Influenza B by PCR NEGATIVE NEGATIVE Final    Comment: (NOTE) The Xpert Xpress SARS-CoV-2/FLU/RSV plus assay is intended as an aid in the diagnosis of influenza from Nasopharyngeal swab specimens and should not be used as a sole basis for treatment. Nasal washings and aspirates are unacceptable for Xpert Xpress SARS-CoV-2/FLU/RSV testing.  Fact Sheet for Patients: EntrepreneurPulse.com.au  Fact Sheet for Healthcare Providers: IncredibleEmployment.be  This test is not yet approved or cleared by the [place] FDA and has been authorized for detection and/or diagnosis of SARS-CoV-2 by FDA under an Emergency Use Authorization (EUA). This EUA will remain in effect (meaning this test can be used) for the duration of the COVID-19 declaration under Section 564(b)(1) of the Act, 21 U.S.C. section 360bbb-3(b)(1), unless the authorization is  terminated or revoked.  Performed at [facility], [place] [address], [employer], [place] [address]     Procedures and diagnostic studies:  DG Tibia/Fibula Right  Result Date: 05/19/2021 CLINICAL DATA:  Osteomyelitis EXAM: RIGHT TIBIA AND FIBULA - 2 VIEW COMPARISON:  None. FINDINGS: Normal alignment. No fracture or dislocation. No osseous erosion identified. Advanced vascular calcifications are seen within the soft tissues of the right lower extremity and visualized right foot. There is diffuse mild subcutaneous edema of the right lower extremity. Incidentally noted is an os ossific curvilinear density adjacent to the medial femoral condyle in keeping with a Pellegrini-Stieda lesion related to remote MCL injury. IMPRESSION: No osseous erosion identified to suggest osteomyelitis. Electronically Signed   By: Fidela Salisbury M.D.   On: 05/19/2021 20:29               LOS: 1 day   Mersadie Kavanaugh  [facility]   Pager on [email or web address]. If 7PM-7AM, please contact night-coverage at [email or web address]     05/20/2021, 4:52 PM

## 2021-05-20 NOTE — Progress Notes (Signed)
Inpatient Diabetes Program Recommendations  AACE/ADA: New Consensus Statement on Inpatient Glycemic Control   Target Ranges:  Prepandial:   less than 140 mg/dL      Peak postprandial:   less than 180 mg/dL (1-2 hours)      Critically ill patients:  140 - 180 mg/dL    Results for SENECA, HOBACK (MRN [id number]) as of 05/20/2021 11:41  Ref. Range 05/19/2021 23:43 05/20/2021 07:59  Glucose-Capillary Latest Ref Range: 70 - 99 mg/dL 144 (H) 297 (H)  Results for CLARK, CUFF (MRN [id number]) as of 05/20/2021 11:41  Ref. Range 05/19/2021 13:08  Hemoglobin A1C Latest Ref Range: 4.8 - 5.6 % 7.5 (H)   Review of Glycemic Control  Diabetes history: DM2 Outpatient Diabetes medications: Novolog 10 units TID with meals plus additional units for correction Current orders for Inpatient glycemic control: Novolog 0-9 units TID with meals  Inpatient Diabetes Program Recommendations:    Insulin: If post prandial glucose is consistently over 180 mg/dl with Novolog correction, please consider ordering Novolog 3 units TID with meals for meal coverage if patient eats at least 50% of meals.  NOTE: Noted consult for diabetes coordinator per lower extremity wound order set. Chart reviewed. Noted patient last seen PCP for DM on 02/13/21 and has hx of ESRD w/HD. Agree with current orders. Would recommend adding Novolog meal coverage insulin if post prandial glucose is consistently over 180 mg/dl with Novolog correction.   Thanks, Barnie Alderman, RN, MSN, CDE Diabetes Coordinator Inpatient Diabetes Program [phone] (Team Pager from 8am to 5pm)

## 2021-05-20 NOTE — Progress Notes (Addendum)
   05/20/21 1235  Vitals  Temp  (refused)  BP (!) 103/52  BP Location Right Arm  BP Method Automatic  Patient Position (if appropriate) Lying  Pulse Rate 89  Pulse Rate Source Monitor  Resp 17  Oxygen Therapy  SpO2 98 %  Post-Hemodialysis Assessment  Rinseback Volume (mL) 250 mL  KECN 204 V  Dialyzer Clearance Lightly streaked  Duration of HD Treatment -hour(s) 3.75 hour(s)  Hemodialysis Intake (mL) 700 mL  UF Total -Machine (mL) 5803 mL  Net UF (mL) 5103 mL  Tolerated HD Treatment Yes  Post-Hemodialysis Comments  (see progress note)  Pt requested to pull 6liters, David-PA on unit and approved as well as pt can tolerate. Pt noted with hypotension midway through hd tx, sys BP 80s. Pt asymptomatic and speaking with Probation officer throughout. Pt adamant that we pull 6 liters of fluid. David-PA notified, received intruction to given albumin IV and continue to monitor and pull at current goal unless pt becomes symptomatic. Pt maintained BP throughout tx, with SYS BP in 80s. No problems noted. Towards end of tx pt requests for tx to be terminated due to having to use bathroom and not wanting to on unit. HD tx terminated with 18 minutes left. David-PA notified once returning to unit.

## 2021-05-20 NOTE — Consult Note (Signed)
[facility] Renal Consultation Note  Indication for Consultation:  Management of ESRD/hemodialysis; anemia, hypertension/volume and secondary hyperparathyroidism  HPI: [name] is a 40 y.o. male with ESRD presumed secondary diabetes mellitus, HD MWF at [facility], history of noncompliance with attendance and volume/medications/brittle type I DM, PAD with left AKA,("was to see prosthetic person this week":) hypertension, brought to the ER yesterday via his EMS transfer to outpatient HD because of worsening wounds noted on right lower extremity per transportation personnel. He reports little bit more swelling than usual has pain in both left AKA and right foot also has phantom pain because of diabetic neuropathy he says.  Denies fever chills sweats chest pain has some mild shortness of breath=" I overdid by volume intake, and needs 6 L UF today on dialysis"  labs K5.6 glucose 121 WBC 5.5 Hgb 10.0 x-ray of right tibia-fibula showing no osteo-, MRI pending.  Admitted with right lower extremity ischemic wound and ulcer with cellulitis, empiric Rocephin, Flagyl vancomycin MRI pending, wound care checking ABIs Noted he missed hemodialysis yesterday Monday so we are consulted for HD today          Past Medical History:  Diagnosis Date   Anemia    Diabetes mellitus without complication ([place])    Diabetic gastroparesis ([place])    Dialysis patient [facility])    Hypertension    Renal disorder    Dialysis   Sepsis [facility])     Past Surgical History:  Procedure Laterality Date   AMPUTATION Right 02/02/2018   Procedure: RIGHT FIFTH TOE AND METATARSAL AMPUTATION. Filetted toe flap metatarsal resection. Debridement Plantar Foot wound;  Surgeon: Evelina Bucy, DPM;  Location: [place];  Service: Podiatry;  Laterality: Right;   AMPUTATION Left 08/20/2018   Procedure: FIFTH METATARSAL BONE BIOPSY;  Surgeon: Evelina Bucy, DPM;  Location: [place];  Service: Podiatry;  Laterality: Left;   AMPUTATION  Left 10/28/2018   Procedure: LEFT GREAT TOE AMPUTATION;  Surgeon: Evelina Bucy, DPM;  Location: [place];  Service: Podiatry;  Laterality: Left;   AMPUTATION Left 09/16/2020   Procedure: AMPUTATION BELOW KNEE;  Surgeon: Rosetta Posner, MD;  Location: [place];  Service: Vascular;  Laterality: Left;   AMPUTATION Left 10/15/2020   Procedure: AMPUTATION ABOVE KNEE;  Surgeon: Elam Dutch, MD;  Location: [place];  Service: Vascular;  Laterality: Left;   APPLICATION OF WOUND VAC  02/02/2018   Procedure: APPLICATION OF WOUND VAC  Right Foot;  Surgeon: Evelina Bucy, DPM;  Location: [place];  Service: Podiatry;;   APPLICATION OF WOUND VAC Left 10/28/2018   Procedure: APPLICATION OF WOUND VAC LEFT TOE;  Surgeon: Evelina Bucy, DPM;  Location: [place];  Service: Podiatry;  Laterality: Left;   APPLICATION OF WOUND VAC Left 11/01/2018   Procedure: APPLICATION OF WOUND VAC;  Surgeon: Evelina Bucy, DPM;  Location: [place];  Service: Podiatry;  Laterality: Left;   AV FISTULA PLACEMENT     left arm.   AV FISTULA PLACEMENT Right 12/22/2016   Procedure: INSERTION OF ARTERIOVENOUS (AV) GORE-TEX GRAFT ARM;  Surgeon: Elam Dutch, MD;  Location: [facility] OR;  Service: Vascular;  Laterality: Right;   AV FISTULA PLACEMENT Left 05/26/2018   Procedure: INSERTION OF  ARTERIOVENOUS (AV) GORE-TEX GRAFT LEFT ARM;  Surgeon: Serafina Mitchell, MD;  Location: [place];  Service: Vascular;  Laterality: Left;   EYE SURGERY     I & D EXTREMITY Right 10/31/2017   Procedure: IRRIGATION AND  DEBRIDEMENT RIGHT FOOT;  Surgeon: Evelina Bucy, DPM;  Location: [place];  Service: Podiatry;  Laterality: Right;   I & D EXTREMITY Left 08/20/2018   Procedure: IRRIGATION AND DEBRIDEMENT EXTREMITY WITH SECONDARY WOUND CLOSUREAND APPLICATION OF WOUND VAC LEFT FOOT;  Surgeon: Evelina Bucy, DPM;  Location: [place];  Service: Podiatry;  Laterality: Left;   I & D EXTREMITY Left 10/20/2018   Procedure: IRRIGATION AND DEBRIDEMENT LEFT FOOT  DEBRIDEMENT  LATERAL FOOT WOUND;  Surgeon: Evelina Bucy, DPM;  Location: [place];  Service: Podiatry;  Laterality: Left;   I & D EXTREMITY Left 10/28/2018   Procedure: IRRIGATION AND DEBRIDEMENT LEFT TOE;  Surgeon: Evelina Bucy, DPM;  Location: [place];  Service: Podiatry;  Laterality: Left;   I & D EXTREMITY Left 09/14/2020   Procedure: IRRIGATION AND DEBRIDEMENT WRIST;  Surgeon: Leanora Cover, MD;  Location: [place];  Service: Orthopedics;  Laterality: Left;   INSERTION OF DIALYSIS CATHETER     Right subclavian   IR AV DIALY SHUNT INTRO NEEDLE/INTRACATH INITIAL W/PTA/IMG RIGHT Right 02/05/2018   IR FLUORO GUIDE CV LINE RIGHT  01/31/2020   IR THROMBECTOMY AV FISTULA W/THROMBOLYSIS/PTA INC/SHUNT/IMG LEFT Left 08/24/2018   IR THROMBECTOMY AV FISTULA W/THROMBOLYSIS/PTA INC/SHUNT/IMG LEFT Left 01/06/2019   IR US GUIDE VASC ACCESS LEFT  08/24/2018   IR US GUIDE VASC ACCESS RIGHT  02/05/2018   IR US GUIDE VASC ACCESS RIGHT  01/31/2020   IRRIGATION AND DEBRIDEMENT FOOT Right 10/23/2018   Procedure: Irrigation and Debridement to tendon, Left Foot;  Surgeon: Evelina Bucy, DPM;  Location: [place];  Service: Podiatry;  Laterality: Right;   IRRIGATION AND DEBRIDEMENT FOOT Left 11/01/2018   Procedure: IRRIGATION AND DEBRIDEMENT PARTIAL WOUND CLOSURE LOCAL TISSUE TRANSFER AND FLAP ROTATION, LEFT FOOT;  Surgeon: Evelina Bucy, DPM;  Location: [place];  Service: Podiatry;  Laterality: Left;   TRANSMETATARSAL AMPUTATION N/A 08/18/2018   Procedure: IRRIGATION AND DEBRIDEMENT OF LEFT 5TH TOE AND TRANSMETATARSAL, WITH PARTICAL LEFT 5TH TOE AND METATARSAL AMPUTATION, BONE BIOPSY, WOUND VAC APPLICATION.;  Surgeon: Evelina Bucy, DPM;  Location: [place];  Service: Podiatry;  Laterality: N/A;   UPPER EXTREMITY VENOGRAPHY N/A 11/16/2016   Procedure: Upper Extremity Venography - Right Central;  Surgeon: Elam Dutch, MD;  Location: [place] [facility];  Service: Cardiovascular;  Laterality: N/A;   UPPER EXTREMITY VENOGRAPHY N/A  05/25/2018   Procedure: UPPER EXTREMITY VENOGRAPHY - Bilateral;  Surgeon: Marty Heck, MD;  Location: [place] [facility];  Service: Cardiovascular;  Laterality: N/A;      Family History  Problem Relation Age of Onset   Diabetes Mellitus II Other    Diabetes Father    Renal Disease Father        ESRD      reports that he has never smoked. He has never used smokeless tobacco. He reports that he does not drink alcohol and does not use drugs.  No Known Allergies  Prior to Admission medications   Medication Sig Start Date End Date Taking? Authorizing Provider  cinacalcet (SENSIPAR) 60 MG tablet Take 120 mg by mouth daily after supper. 04/24/20  Yes [provider]  gabapentin (NEURONTIN) 100 MG capsule Take 1 capsule (100 mg total) by mouth 3 (three) times daily. 05/05/21  Yes Kerin Perna, NP  insulin aspart (NOVOLOG) 100 UNIT/ML injection Inject 10 Units into the skin 3 (three) times daily before meals. For blood sugars 0-150 give 0 units of insulin, 151-200 give  2 units of insulin, 201-250 give 4 units, 251-300 give 6 units, 301-350 give 8 units, 351-400 give 10 units,> 400 give 12 units and call M.D. Discussed hypoglycemia protocol. 04/08/21  Yes Kerin Perna, NP  sevelamer carbonate (RENVELA) 800 MG tablet Take 2,400-4,000 mg by mouth See admin instructions. Take 4,000 mg (5 tablets) by mouth with meals and 2,400 mg (3 tablets) with snacks 04/27/19  Yes [provider]  Blood Glucose Monitoring Suppl ([place]) w/Device KIT 1 Bag by Does not apply route 3 (three) times daily before meals. 05/02/20   Kerin Perna, NP  calcitRIOL (ROCALTROL) 0.5 MCG capsule Take 5 capsules (2.5 mcg total) by mouth every Monday, Wednesday, and Friday with hemodialysis. Patient not taking: Reported on 05/19/2021 10/26/18   Modena Jansky, MD  glucose blood (ONETOUCH VERIO) test strip USE 1 STRIP TO CHECK GLUCOSE THREE TIMES DAILY 03/11/21   Kerin Perna, NP  Lancets [facility] DELICA PLUS [id number]) [place] Apply topically. 05/02/20   [provider]      Results for orders placed or performed during the hospital encounter of 05/19/21 (from the past 48 hour(s))  Basic metabolic panel     Status: Abnormal   Collection Time: 05/19/21  1:08 PM  Result Value Ref Range   Sodium 133 (L) 135 - 145 mmol/L   Potassium 4.9 3.5 - 5.1 mmol/L   Chloride 95 (L) 98 - 111 mmol/L   CO2 22 22 - 32 mmol/L   Glucose, Bld 153 (H) 70 - 99 mg/dL    Comment: Glucose reference range applies only to samples taken after fasting for at least 8 hours.   BUN 77 (H) 6 - 20 mg/dL   Creatinine, Ser 7.08 (H) 0.61 - 1.24 mg/dL   Calcium 9.8 8.9 - 10.3 mg/dL   GFR, Estimated 9 (L) >60 mL/min    Comment: (NOTE) Calculated using the CKD-EPI Creatinine Equation (2021)    Anion gap 16 (H) 5 - 15    Comment: Performed at [facility] [address] [place], [place] [address]  CBC with Differential     Status: Abnormal   Collection Time: 05/19/21  1:08 PM  Result Value Ref Range   WBC 6.5 4.0 - 10.5 K/uL   RBC 3.59 (L) 4.22 - 5.81 MIL/uL   Hemoglobin 10.2 (L) 13.0 - 17.0 g/dL   HCT 34.0 (L) 39.0 - 52.0 %   MCV 94.7 80.0 - 100.0 fL   MCH 28.4 26.0 - 34.0 pg   MCHC 30.0 30.0 - 36.0 g/dL   RDW 16.6 (H) 11.5 - 15.5 %   Platelets 125 (L) 150 - 400 K/uL   nRBC 0.0 0.0 - 0.2 %   Neutrophils Relative % 62 %   Neutro Abs 4.1 1.7 - 7.7 K/uL   Lymphocytes Relative 17 %   Lymphs Abs 1.1 0.7 - 4.0 K/uL   Monocytes Relative 12 %   Monocytes Absolute 0.8 0.1 - 1.0 K/uL   Eosinophils Relative 7 %   Eosinophils Absolute 0.5 0.0 - 0.5 K/uL   Basophils Relative 1 %   Basophils Absolute 0.0 0.0 - 0.1 K/uL   Immature Granulocytes 1 %   Abs Immature Granulocytes 0.03 0.00 - 0.07 K/uL    Comment: Performed at [facility], [address] [place], [place] [address]  Sedimentation rate     Status: Abnormal   Collection Time: 05/19/21  1:08 PM  Result  Value Ref Range  Sed Rate 17 (H) 0 - 16 mm/hr    Comment: Performed at [facility], [place] [address] [place], [place] [address]  C-reactive protein     Status: None   Collection Time: 05/19/21  1:08 PM  Result Value Ref Range   CRP 0.5 <1.0 mg/dL    Comment: Performed at [facility], [place] [address], [place], [place] [address]  Hemoglobin A1c     Status: Abnormal   Collection Time: 05/19/21  1:08 PM  Result Value Ref Range   Hgb A1c MFr Bld 7.5 (H) 4.8 - 5.6 %    Comment: (NOTE) Pre diabetes:          5.7%-6.4%  Diabetes:              >6.4%  Glycemic control for   <7.0% adults with diabetes    Mean Plasma Glucose 168.55 mg/dL    Comment: Performed at [facility] [address], [place], [place] [address]  Resp Panel by RT-PCR (Flu A&B, Covid) Nasopharyngeal Swab     Status: None   Collection Time: 05/19/21  8:50 PM   Specimen: Nasopharyngeal Swab; Nasopharyngeal(NP) swabs in vial transport medium  Result Value Ref Range   SARS Coronavirus 2 by RT PCR NEGATIVE NEGATIVE    Comment: (NOTE) SARS-CoV-2 target nucleic acids are NOT DETECTED.  The SARS-CoV-2 RNA is generally detectable in upper respiratory specimens during the acute phase of infection. The lowest concentration of SARS-CoV-2 viral copies this assay can detect is 138 copies/mL. A negative result does not preclude SARS-Cov-2 infection and should not be used as the sole basis for treatment or other patient management decisions. A negative result may occur with  improper specimen collection/handling, submission of specimen other than nasopharyngeal swab, presence of viral mutation(s) within the areas targeted by this assay, and inadequate number of viral copies(<138 copies/mL). A negative result must be combined with clinical observations, patient history, and epidemiological information. The expected result is Negative.  Fact Sheet for Patients:  EntrepreneurPulse.com.au  Fact  Sheet for Healthcare Providers:  IncredibleEmployment.be  This test is no t yet approved or cleared by the [place] FDA and  has been authorized for detection and/or diagnosis of SARS-CoV-2 by FDA under an Emergency Use Authorization (EUA). This EUA will remain  in effect (meaning this test can be used) for the duration of the COVID-19 declaration under Section 564(b)(1) of the Act, 21 U.S.C.section 360bbb-3(b)(1), unless the authorization is terminated  or revoked sooner.       Influenza A by PCR NEGATIVE NEGATIVE   Influenza B by PCR NEGATIVE NEGATIVE    Comment: (NOTE) The Xpert Xpress SARS-CoV-2/FLU/RSV plus assay is intended as an aid in the diagnosis of influenza from Nasopharyngeal swab specimens and should not be used as a sole basis for treatment. Nasal washings and aspirates are unacceptable for Xpert Xpress SARS-CoV-2/FLU/RSV testing.  Fact Sheet for Patients: EntrepreneurPulse.com.au  Fact Sheet for Healthcare Providers: IncredibleEmployment.be  This test is not yet approved or cleared by the [place] FDA and has been authorized for detection and/or diagnosis of SARS-CoV-2 by FDA under an Emergency Use Authorization (EUA). This EUA will remain in effect (meaning this test can be used) for the duration of the COVID-19 declaration under Section 564(b)(1) of the Act, 21 U.S.C. section 360bbb-3(b)(1), unless the authorization is terminated or revoked.  Performed at [facility], [place] [name] [place], [place] [address]   Prealbumin  Status: None   Collection Time: 05/19/21  8:50 PM  Result Value Ref Range   Prealbumin 26.8 18 - 38 mg/dL    Comment: Performed at [facility] [address] [place], [place] [address]  CBG monitoring, ED     Status: Abnormal   Collection Time: 05/19/21 11:43 PM  Result Value Ref Range   Glucose-Capillary 144 (H) 70 - 99 mg/dL    Comment: Glucose  reference range applies only to samples taken after fasting for at least 8 hours.  CBC     Status: Abnormal   Collection Time: 05/20/21  1:49 AM  Result Value Ref Range   WBC 5.5 4.0 - 10.5 K/uL   RBC 3.57 (L) 4.22 - 5.81 MIL/uL   Hemoglobin 10.0 (L) 13.0 - 17.0 g/dL   HCT 33.6 (L) 39.0 - 52.0 %   MCV 94.1 80.0 - 100.0 fL   MCH 28.0 26.0 - 34.0 pg   MCHC 29.8 (L) 30.0 - 36.0 g/dL   RDW 16.5 (H) 11.5 - 15.5 %   Platelets 140 (L) 150 - 400 K/uL   nRBC 0.0 0.0 - 0.2 %    Comment: Performed at [facility] [address] [place], [place] [address]  Basic metabolic panel     Status: Abnormal   Collection Time: 05/20/21  1:49 AM  Result Value Ref Range   Sodium 129 (L) 135 - 145 mmol/L   Potassium 5.6 (H) 3.5 - 5.1 mmol/L   Chloride 93 (L) 98 - 111 mmol/L   CO2 20 (L) 22 - 32 mmol/L   Glucose, Bld 121 (H) 70 - 99 mg/dL    Comment: Glucose reference range applies only to samples taken after fasting for at least 8 hours.   BUN 84 (H) 6 - 20 mg/dL   Creatinine, Ser 7.51 (H) 0.61 - 1.24 mg/dL   Calcium 9.3 8.9 - 10.3 mg/dL   GFR, Estimated 9 (L) >60 mL/min    Comment: (NOTE) Calculated using the CKD-EPI Creatinine Equation (2021)    Anion gap 16 (H) 5 - 15    Comment: Performed at [facility] [address], [place], [place] [address]  Glucose, capillary     Status: Abnormal   Collection Time: 05/20/21  7:59 AM  Result Value Ref Range   Glucose-Capillary 297 (H) 70 - 99 mg/dL    Comment: Glucose reference range applies only to samples taken after fasting for at least 8 hours.    ROS: See HPI   Physical Exam: Vitals:   05/20/21 1130 05/20/21 1200  BP: (!) 85/50 (!) 84/49  Pulse: 85 85  Resp:    Temp:    SpO2:       General: Alert talkative male appears chronically ill NAD HEENT: [place], facial edema, nonicteric, EOMI, MM Neck: No JVD supple Heart: RRR, 2/6 SEM, no RG Lungs: CTA nonlabored breathing Abdomen: bowel sounds normoactive, soft NT, abdominal edema  but no significant distention Extremities: RLE edematous 2+ with ischemic type sores moist on lower leg, left AKA stump wrapped dry clean Skin: No overt rash right lower extremity ulcers as above Neuro: Alert O x3, no acute focal deficits for him Dialysis Access: Patent on dialysis right IJ PermCath clotted left upper arm AV Gore-Tex graft  Dialysis Orders: Center: [facility] , MWF, 4 hours 15 minutes EDW 75 kg Two 2K, 2 CA bath No heparin Mircera 100 MCG every 4 hours last given 8/03 Calcitriol 1.5 MCG p.o.  q. Dialysis Right IJ Perm Cath /left upper arm AVG clotted      Assessment/Plan Right lower extremity skin weak appearing ulcers and cellulitis= ABX per admit, MRI pending, will consult, checking ABIs, noted by consult vascular surgeon ESRD -HD schedule MWF, missed HD yesterday significant volume on UF attempt 6 L today, 3-hour dialysis tomorrow keep on schedule and with significant volume Hypertension/volume  -initial BP in ER 146/96, BP now dropping with UF on hemo-no current BP meds, excess volume secondary to noncompliance with HD schedule and volume intake Anemia  -Hgb 10.0, ESA to next treatment follow-up Hgb trend Metabolic bone disease -continue vitamin D on dialysis and phosphate binder, Sensipar Nutrition -per his hospital history adamantly refuses renal diet continue current heart healthy diet follow-up phosphorus potassium does need fluid restriction, follow-up albumin may need protein supplements Diabetes mellitus= management per admit History of PAD with left AKA= ABIs on the right side lower extremity pending  [name], PA-C [place] [phone] 05/20/2021, 12:10 PM

## 2021-05-20 NOTE — Consult Note (Signed)
[facility] Nurse Consult Note: Reason for Consult: Full thickness wound on RLE, lateral aspect. Other scattered wounds on RLE that have healed.  Weeping in ED. It is noted that patient was due for HD yesterday. Wound type: venous insufficiency vs trauma vs calciphylaxis Pressure Injury POA: N/A Measurement:RLE lateral aspect wound is approximately 2cm round x 0.1cm, red, moist Wound bed: As described above Drainage (amount, consistency, odor) serous, small Periwound: As noted above Dressing procedure/placement/frequency: A Prevalon boot is provided to float the heel, Eucerin cream is provided to moisturize following bathing. Topical care to the wound is with an antimicrobial nonadherent, xeroform gauze.  Securement is with a few turns of Kerlix roll gauze/paper tape. A sacral prophylactic foam dressing is to be placed for PI prevention.  [facility] nursing team will not follow, but will remain available to this patient, the nursing and medical teams.  Please re-consult if needed. Thanks, Maudie Flakes, MSN, RN, [place], Ar[facility]  Pager# [phone]

## 2021-05-20 NOTE — Progress Notes (Signed)
Pt came back  post HD tx. Pt alert/oriented in no apparent distress. Pt requested for a BSC, and offered, Pt able to ambulate with RW and standby assist. Pt wound has been cleaned , and dressing applied as ordered. This Probation officer assisted  pt his toilet needs.and assisted back to his bed. No further complaints.

## 2021-05-21 ENCOUNTER — Inpatient Hospital Stay (HOSPITAL_COMMUNITY): Payer: MEDICARE

## 2021-05-21 ENCOUNTER — Encounter (HOSPITAL_COMMUNITY): Payer: Medicare HMO

## 2021-05-21 DIAGNOSIS — E118 Type 2 diabetes mellitus with unspecified complications: Secondary | ICD-10-CM | POA: Diagnosis not present

## 2021-05-21 DIAGNOSIS — E1022 Type 1 diabetes mellitus with diabetic chronic kidney disease: Secondary | ICD-10-CM | POA: Diagnosis not present

## 2021-05-21 DIAGNOSIS — L03115 Cellulitis of right lower limb: Secondary | ICD-10-CM | POA: Diagnosis not present

## 2021-05-21 DIAGNOSIS — R600 Localized edema: Secondary | ICD-10-CM | POA: Diagnosis not present

## 2021-05-21 DIAGNOSIS — Z992 Dependence on renal dialysis: Secondary | ICD-10-CM | POA: Diagnosis not present

## 2021-05-21 DIAGNOSIS — N186 End stage renal disease: Secondary | ICD-10-CM | POA: Diagnosis not present

## 2021-05-21 LAB — CBC
HCT: 32.7 % — ABNORMAL LOW (ref 39.0–52.0)
Hemoglobin: 9.7 g/dL — ABNORMAL LOW (ref 13.0–17.0)
MCH: 28.3 pg (ref 26.0–34.0)
MCHC: 29.7 g/dL — ABNORMAL LOW (ref 30.0–36.0)
MCV: 95.3 fL (ref 80.0–100.0)
Platelets: 127 10*3/uL — ABNORMAL LOW (ref 150–400)
RBC: 3.43 MIL/uL — ABNORMAL LOW (ref 4.22–5.81)
RDW: 17 % — ABNORMAL HIGH (ref 11.5–15.5)
WBC: 4.9 10*3/uL (ref 4.0–10.5)
nRBC: 0 % (ref 0.0–0.2)

## 2021-05-21 LAB — RENAL FUNCTION PANEL
Albumin: 3.4 g/dL — ABNORMAL LOW (ref 3.5–5.0)
Anion gap: 14 (ref 5–15)
BUN: 56 mg/dL — ABNORMAL HIGH (ref 6–20)
CO2: 23 mmol/L (ref 22–32)
Calcium: 8.5 mg/dL — ABNORMAL LOW (ref 8.9–10.3)
Chloride: 96 mmol/L — ABNORMAL LOW (ref 98–111)
Creatinine, Ser: 6.18 mg/dL — ABNORMAL HIGH (ref 0.61–1.24)
GFR, Estimated: 11 mL/min — ABNORMAL LOW (ref >60)
Glucose, Bld: 164 mg/dL — ABNORMAL HIGH (ref 70–99)
Phosphorus: 7.6 mg/dL — ABNORMAL HIGH (ref 2.5–4.6)
Potassium: 4.9 mmol/L (ref 3.5–5.1)
Sodium: 133 mmol/L — ABNORMAL LOW (ref 135–145)

## 2021-05-21 LAB — GLUCOSE, CAPILLARY
Glucose-Capillary: 110 mg/dL — ABNORMAL HIGH (ref 70–99)
Glucose-Capillary: 191 mg/dL — ABNORMAL HIGH (ref 70–99)
Glucose-Capillary: 339 mg/dL — ABNORMAL HIGH (ref 70–99)
Glucose-Capillary: 241 mg/dL — ABNORMAL HIGH (ref 70–99)

## 2021-05-21 MED ORDER — DARBEPOETIN ALFA 100 MCG/0.5ML IJ SOSY
PREFILLED_SYRINGE | INTRAMUSCULAR | Status: AC
  Administered 2021-05-21: 100 ug via INTRAVENOUS
  Filled 2021-05-21: qty 0.5

## 2021-05-21 MED ORDER — ALBUMIN HUMAN 25 % IV SOLN
INTRAVENOUS | Status: AC
  Administered 2021-05-21: 25 g
  Filled 2021-05-21: qty 100

## 2021-05-21 MED ORDER — INSULIN ASPART 100 UNIT/ML IJ SOLN
4.0000 [IU] | Freq: Three times a day (TID) | INTRAMUSCULAR | Status: DC
  Administered 2021-05-21 – 2021-05-22 (×4): 4 [IU] via SUBCUTANEOUS

## 2021-05-21 MED ORDER — HEPARIN SODIUM (PORCINE) 1000 UNIT/ML IJ SOLN
INTRAMUSCULAR | Status: AC
  Administered 2021-05-21: 1000 [IU]
  Filled 2021-05-21: qty 4

## 2021-05-21 MED ORDER — HYDROMORPHONE HCL 1 MG/ML IJ SOLN
0.5000 mg | INTRAMUSCULAR | Status: AC | PRN
  Administered 2021-05-21 – 2021-05-22 (×2): 0.5 mg via INTRAVENOUS
  Filled 2021-05-21 (×2): qty 0.5

## 2021-05-21 MED ORDER — VANCOMYCIN HCL IN DEXTROSE 1-5 GM/200ML-% IV SOLN
1000.0000 mg | INTRAVENOUS | Status: DC
  Filled 2021-05-21: qty 200

## 2021-05-21 MED ORDER — OXYCODONE-ACETAMINOPHEN 5-325 MG PO TABS
ORAL_TABLET | ORAL | Status: AC
  Filled 2021-05-21: qty 2

## 2021-05-21 MED ORDER — VANCOMYCIN HCL IN DEXTROSE 1-5 GM/200ML-% IV SOLN
INTRAVENOUS | Status: AC
  Filled 2021-05-21: qty 200

## 2021-05-21 MED ORDER — VANCOMYCIN HCL IN DEXTROSE 1-5 GM/200ML-% IV SOLN
1000.0000 mg | INTRAVENOUS | Status: AC
  Administered 2021-05-21: 1000 mg via INTRAVENOUS

## 2021-05-21 MED ORDER — CALCITRIOL 0.5 MCG PO CAPS
ORAL_CAPSULE | ORAL | Status: AC
  Administered 2021-05-21: 1.5 ug via ORAL
  Filled 2021-05-21: qty 3

## 2021-05-21 NOTE — Progress Notes (Addendum)
Subjective: On dialysis =normal day-today no shortness of breath says feels slightly better.  Today tolerated 5 L UF  Objective Vital signs in last 24 hours: Vitals:   05/21/21 0727 05/21/21 0920 05/21/21 0930 05/21/21 1000  BP: 112/74 132/72 139/70 140/77  Pulse: 76 88 95 96  Resp: 14 14 16 16   Temp: 97.6 F (36.4 C) 98.1 F (36.7 C)    TempSrc: Oral Oral    SpO2: 92% 95%    Weight:  88 kg     Weight change:   Physical Exam: General: Alert male appears chronically ill NAD Heart: RRR, 2/6 SEM, no RG Lungs: CTA nonlabored breathing  Abdomen: bowel sounds normoactive, soft NT, abdominal edema but no significant distention Extremities: RLE edematous 1+ with dry clean bandages Lower leg, left AKA stump wrapped dry clean Dialysis Access: Patent on dialysis right IJ PermCath clotted left upper arm AV Gore-Tex graft   Dialysis Orders: Center: [facility] , MWF, 4 hours 15 minutes EDW 75 kg Two 2K, 2 CA bath No heparin Mircera 100 MCG every 4 hours last given 8/03 Calcitriol 1.5 MCG p.o. q. Dialysis Right IJ Perm Cath /left upper arm AVG clotted (no current plans until all wounds are healed)     Problem/Plan: RLE skin ulcers/ cellulitis= ABX per admit, MRI pending, planSper admit ESRD -HD schedule MWF, missed HD Monday 8/22, HD yesterday.K5.6 HD today to keep on schedule ,significant volume on UF attempt as tolerated  Hypertension/volume  -initial BP in ER 146/96, BP now dropping with UF on hemo-no current BP meds, excess volume secondary to noncompliance with HD schedule and volume intake Anemia  -Hgb 10.0, ESA to next treatment follow-up Hgb trend Metabolic bone disease -continue vitamin D on dialysis and phosphate binder, Sensipar, calcium phosphorus stable Nutrition -per his hospital history adamantly refuses renal diet continue current heart healthy diet follow-up phosphorus potassium does need fluid restriction, follow-up albumin may need protein supplements Diabetes mellitus=  management per admit History of PAD with left AKA  [name], PA-C [facility] [place] [phone] 05/21/2021,10:15 AM  LOS: 2 days   Kelly Splinter, MD 05/21/2021, 12:12 PM     Labs: Basic Metabolic Panel: Recent Labs  Lab 05/19/21 1308 05/20/21 0149  NA 133* 129*  K 4.9 5.6*  CL 95* 93*  CO2 22 20*  GLUCOSE 153* 121*  BUN 77* 84*  CREATININE 7.08* 7.51*  CALCIUM 9.8 9.3   Liver Function Tests: No results for input(s): AST, ALT, ALKPHOS, BILITOT, PROT, ALBUMIN in the last 168 hours. No results for input(s): LIPASE, AMYLASE in the last 168 hours. No results for input(s): AMMONIA in the last 168 hours. CBC: Recent Labs  Lab 05/19/21 1308 05/20/21 0149  WBC 6.5 5.5  NEUTROABS 4.1  --   HGB 10.2* 10.0*  HCT 34.0* 33.6*  MCV 94.7 94.1  PLT 125* 140*   Cardiac Enzymes: No results for input(s): CKTOTAL, CKMB, CKMBINDEX, TROPONINI in the last 168 hours. CBG: Recent Labs  Lab 05/20/21 0759 05/20/21 1305 05/20/21 1634 05/20/21 2144 05/21/21 0649  GLUCAP 297* 185* 370* 206* 110*    Medications:  albumin human     cefTRIAXone (ROCEPHIN)  IV 2 g (05/20/21 2040)    calcitRIOL       Darbepoetin Alfa       oxyCODONE-acetaminophen       (feeding supplement) PROSource Plus  30 mL Oral TID BM   calcitRIOL  1.5 mcg Oral Q M,W,F-HD   Chlorhexidine Gluconate Cloth  6  each Topical Q0600   cinacalcet  120 mg Oral Q supper   darbepoetin (ARANESP) injection - DIALYSIS  100 mcg Intravenous Q Wed-HD   feeding supplement (NEPRO CARB STEADY)  237 mL Oral BID BM   gabapentin  100 mg Oral TID   heparin  5,000 Units Subcutaneous Q8H   hydrocerin   Topical Daily   insulin aspart  0-9 Units Subcutaneous TID WC   insulin aspart  4 Units Subcutaneous TID WC   metroNIDAZOLE  500 mg Oral Q8H   multivitamin  1 tablet Oral QHS   nutrition supplement (JUVEN)  1 packet Oral BID BM   sevelamer carbonate  2,400 mg Oral With snacks   sevelamer carbonate  4,000 mg Oral  TID WC   vancomycin variable dose per unstable renal function (pharmacist dosing)   Does not apply See admin instructions

## 2021-05-21 NOTE — Evaluation (Signed)
Physical Therapy Evaluation Patient Details Name: [name] MRN: [id number] DOB: 02/06/1981 Today's Date: 05/21/2021   History of Present Illness  [name] is a 40 y.o. male presents to [facility] on 8/22 with RLE wounds, cellulitis.  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  Clinical Impression  Pt presents with decreased arousal, impaired strength, min difficulty performing mobility tasks, and decreased activity tolerance. Pt to benefit from acute PT to address deficits. Pt tolerated stand and lateral hop-steps only this session, pt more lethargic-appearing vs chart review and per NT at end of session. RN notified to assess pt, PT to see tomorrow to further assess mobility.     Follow Up Recommendations Home health PT;Supervision for mobility/OOB    Equipment Recommendations  None recommended by PT    Recommendations for Other Services       Precautions / Restrictions Precautions Precautions: Fall Precaution Comments: R lower leg wrapped given wounds Restrictions Weight Bearing Restrictions: No Other Position/Activity Restrictions: L AKA      Mobility  Bed Mobility Overal bed mobility: Needs Assistance Bed Mobility: Supine to Sit;Sit to Supine     Supine to sit: Supervision Sit to supine: Supervision   General bed mobility comments: for safety, increased time.    Transfers Overall transfer level: Needs assistance Equipment used: Rolling walker (2 wheeled) Transfers: Sit to/from [facility] Sit to Stand: Min assist;From elevated surface Stand pivot transfers: Min assist;From elevated surface       General transfer comment: min assist for boost to standing, x2 lateral hop steps towards HOB.  Ambulation/Gait             General Gait Details: nt - low level of arousal, pt fatigue  Stairs            Wheelchair Mobility     Modified Rankin (Stroke Patients Only)       Balance Overall balance assessment: Needs assistance;History of Falls Sitting-balance support: No upper extremity supported;Feet supported Sitting balance-[name]: Good     Standing balance support: Bilateral upper extremity supported;During functional activity Standing balance-[name]: Poor                               Pertinent Vitals/Pain Pain Assessment: Faces Faces Pain Scale: Hurts a little bit Pain Location: R lower leg Pain Descriptors / Indicators: Sore;Discomfort Pain Intervention(s): Limited activity within patient's tolerance;Monitored during session;Repositioned    Home Living Family/patient expects to be discharged to:: Private residence Living Arrangements: Alone Available Help at Discharge: Family;Personal care attendant;Available 24 hours/day Type of Home: House Home Access: Stairs to enter   [employer] of Steps: unsure, pt does not specify Home Layout: One level Home Equipment: Walker - 2 wheels;Crutches;Wheelchair - manual      Prior Function Level of Independence: Needs assistance   Gait / Transfers Assistance Needed: pt states he walks with RW, states he does not have a w/c but chart review does  ADL's / Homemaking Assistance Needed: Pt states his mother and cousin live with him, help him "a lot" but does not specify. States he has family drive him to appointments        Hand Dominance   Dominant Hand: Right    Extremity/Trunk Assessment   Upper Extremity Assessment Upper Extremity Assessment: Defer to OT evaluation    Lower Extremity Assessment Lower Extremity  Assessment: Generalized weakness;LLE deficits/detail LLE Deficits / Details: L AKA    Cervical / Trunk Assessment Cervical / Trunk Assessment: Normal  Communication   Communication: No difficulties  Cognition Arousal/Alertness: Lethargic Behavior During Therapy: Flat affect Overall Cognitive  Status: Impaired/Different from baseline Area of Impairment: Orientation;Attention;Following commands;Safety/judgement;Problem solving                 Orientation Level: Disoriented to;Place Current Attention Level: Focused   Following Commands: Follows one step commands with increased time Safety/Judgement: Decreased awareness of safety;Decreased awareness of deficits   Problem Solving: Slow processing;Decreased initiation;Difficulty sequencing;Requires verbal cues;Requires tactile cues General Comments: Pt initially difficult to rouse, sleeping soundly. Wakes with PT calling pt name x3. Pt states he is "in the place where people care for you", unable to word find "hospital". Pt with periodic eye closing during session, appears to drift off but when PT asks "are you awake?" Pt states yes and continues to answer questions/participate in session. Pt with very increased time to respond to questions, follow commands. Pt became tearful, stating "it hurts my feelings when people are mean to me". Per NT at bedside towards end of session, this is far from pt baseline.      General Comments      Exercises     Assessment/Plan    PT Assessment Patient needs continued PT services  PT Problem List Decreased strength;Decreased mobility;Decreased safety awareness;Decreased cognition;Decreased balance;Decreased knowledge of use of DME;Pain;Decreased activity tolerance;Decreased coordination       PT Treatment Interventions DME instruction;Therapeutic activities;Gait training;Therapeutic exercise;Patient/family education;Balance training;Stair training;Functional mobility training;Neuromuscular re-education    PT Goals (Current goals can be found in the Care Plan section)  Acute Rehab PT Goals PT Goal Formulation: With patient Time For Goal Achievement: 06/04/21 Potential to Achieve Goals: Good    Frequency Min 3X/week   Barriers to discharge        Co-evaluation                AM-PAC PT "6 Clicks" Mobility  Outcome Measure Help needed turning from your back to your side while in a flat bed without using bedrails?: A Little Help needed moving from lying on your back to sitting on the side of a flat bed without using bedrails?: A Little Help needed moving to and from a bed to a chair (including a wheelchair)?: A Little Help needed standing up from a chair using your arms (e.g., wheelchair or bedside chair)?: A Little Help needed to walk in hospital room?: A Lot Help needed climbing 3-5 steps with a railing? : A Lot 6 Click Score: 16    End of Session   Activity Tolerance: Patient limited by lethargy Patient left: in bed;with call bell/phone within reach;with bed alarm set Nurse Communication: Mobility status;Other (comment) (low level of arousal, per chart review and NT this is not baseline) PT Visit Diagnosis: Other abnormalities of gait and mobility (R26.89)    Time: 2542-7062 PT Time Calculation (min) (ACUTE ONLY): 22 min   Charges:   PT Evaluation $PT Eval Low Complexity: 1 Low         Syrus Nakama S, PT DPT Acute Rehabilitation Services Pager [phone]  Office [phone]   Roxine Caddy E Ruffin Pyo 05/21/2021, 4:53 PM

## 2021-05-21 NOTE — Progress Notes (Signed)
Inpatient Diabetes Program Recommendations  AACE/ADA: New Consensus Statement on Inpatient Glycemic Control   Target Ranges:  Prepandial:   less than 140 mg/dL      Peak postprandial:   less than 180 mg/dL (1-2 hours)      Critically ill patients:  140 - 180 mg/dL   Results for [name], [name] (MRN [id number]) as of 05/21/2021 07:34  Ref. Range 05/19/2021 23:43 05/20/2021 07:59 05/20/2021 13:05 05/20/2021 16:34 05/20/2021 21:44 05/21/2021 06:49  Glucose-Capillary Latest Ref Range: 70 - 99 mg/dL 144 (H) 297 (H) 185 (H) 370 (H) 206 (H) 110 (H)    Review of Glycemic Control  Diabetes history: DM2 Outpatient Diabetes medications: Novolog 10 units TID with meals plus additional units for correction Current orders for Inpatient glycemic control: Novolog 0-9 units TID with meals   Inpatient Diabetes Program Recommendations:     Insulin: Please consider ordering Novolog 5 units TID with meals for meal coverage if patient eats at least 50% of meals.   NOTE: Noted consult for diabetes coordinator per lower extremity wound order set. Chart reviewed. Noted patient last seen PCP for DM on 02/13/21 and has hx of ESRD w/HD.   Thanks, Barnie Alderman, RN, MSN, CDE Diabetes Coordinator Inpatient Diabetes Program [phone] (Team Pager from 8am to 5pm)

## 2021-05-21 NOTE — Progress Notes (Addendum)
PROGRESS NOTE    [name]  YKD:[id number] DOB: [birth year] DOA: 05/19/2021 PCP: Kerin Perna, NP    No chief complaint on file.   Brief Narrative:  40 year old gentleman prior history of end-stage renal disease on dialysis, type 2 diabetes mellitus, noncompliant to medications admitted for right leg cellulitis.  Patient was empirically started on IV antibiotics and MRI of the leg ordered.  Assessment & Plan:   Principal Problem:   Cellulitis of right leg Active Problems:   Type 2 diabetes mellitus with complication, without long-term current use of insulin ([name])   ESRD on dialysis ([place])   Cellulitis of the right lower extremity Patient was started on broad-spectrum IV antibiotics. MRI of the leg ordered and is pending Pain control and wound care. Patient afebrile and WBC count within normal limits.   End-stage renal disease on dialysis Nephrology on board    Type 2 diabetes mellitus Insulin-dependent CBG (last 3)  Recent Labs    05/20/21 2144 05/21/21 0649 05/21/21 1325  GLUCAP 206* 110* 191*   Continue sliding scale insulin.  History of left AKA Recommend outpatient follow-up with vascular surgery as recommended  Anemia of chronic disease probably secondary to end-stage renal disease   Thrombocytopenia Mild, continue to monitor.   DVT prophylaxis: (Heparin.  Code Status: (Full Code) Family Communication: none at bedside.  Disposition:   Status is: Inpatient  Remains inpatient appropriate because:Ongoing diagnostic testing needed not appropriate for outpatient work up, Unsafe d/c plan, and IV treatments appropriate due to intensity of illness or inability to take PO  Dispo: The patient is from: Home              Anticipated d/c is to: Home              Patient currently is not medically stable to d/c.   Difficult to place patient No       Consultants:  Nephrology.   Procedures: MRI of the foot.   Antimicrobials:  Antibiotics  Given (last 72 hours)     Date/Time Action Medication Dose Rate   05/19/21 2204 Given   metroNIDAZOLE (FLAGYL) tablet 500 mg 500 mg    05/19/21 2230 New Bag/Given   cefTRIAXone (ROCEPHIN) 2 g in sodium chloride 0.9 % 100 mL IVPB 2 g 200 mL/hr   05/19/21 2304 New Bag/Given   vancomycin (VANCOREADY) IVPB 1750 mg/350 mL 1,750 mg 175 mL/hr   05/20/21 0617 Given   metroNIDAZOLE (FLAGYL) tablet 500 mg 500 mg    05/20/21 1152 New Bag/Given   vancomycin (VANCOCIN) IVPB 1000 mg/200 mL premix 1,000 mg 200 mL/hr   05/20/21 1456 Given   metroNIDAZOLE (FLAGYL) tablet 500 mg 500 mg    05/20/21 2040 New Bag/Given   cefTRIAXone (ROCEPHIN) 2 g in sodium chloride 0.9 % 100 mL IVPB 2 g 200 mL/hr   05/20/21 2111 Given   metroNIDAZOLE (FLAGYL) tablet 500 mg 500 mg    05/21/21 0645 Given   metroNIDAZOLE (FLAGYL) tablet 500 mg 500 mg    05/21/21 1209 New Bag/Given  [given during HD.]   vancomycin (VANCOCIN) IVPB 1000 mg/200 mL premix 1,000 mg 200 mL/hr   05/21/21 1316 Given   metroNIDAZOLE (FLAGYL) tablet 500 mg 500 mg          Subjective: Pain controlled.   Objective: Vitals:   05/21/21 1100 05/21/21 1130 05/21/21 1200 05/21/21 1230  BP: 120/60 114/60 (!) 112/54 117/60  Pulse: 92 100 92 92  Resp: 16 16 16  16  Temp:    98 F (36.7 C)  TempSrc:    Oral  SpO2:    95%  Weight:    84 kg    Intake/Output Summary (Last 24 hours) at 05/21/2021 1446 Last data filed at 05/21/2021 1230 Gross per 24 hour  Intake 222.2 ml  Output 4300 ml  Net -4077.8 ml   Filed Weights   05/20/21 1230 05/21/21 0920 05/21/21 1230  Weight: 87.8 kg 88 kg 84 kg    Examination:  General exam: Appears calm and comfortable  Respiratory system: Clear to auscultation. Respiratory effort normal. Cardiovascular system: S1 & S2 heard, RRR. No JVD, No pedal edema. Gastrointestinal system: Abdomen is nondistended, soft and nontender.  Normal bowel sounds heard. Central nervous system: Alert and oriented. No focal  neurological deficits. Extremities: left AKA, right leg swelling and tenderness improving.  Skin: No rashes, lesions or ulcers Psychiatry:  Mood & affect appropriate.     Data Reviewed: I have personally reviewed following labs and imaging studies  CBC: Recent Labs  Lab 05/19/21 1308 05/20/21 0149 05/21/21 0856  WBC 6.5 5.5 4.9  NEUTROABS 4.1  --   --   HGB 10.2* 10.0* 9.7*  HCT 34.0* 33.6* 32.7*  MCV 94.7 94.1 95.3  PLT 125* 140* 127*    Basic Metabolic Panel: Recent Labs  Lab 05/19/21 1308 05/20/21 0149 05/21/21 0856  NA 133* 129* 133*  K 4.9 5.6* 4.9  CL 95* 93* 96*  CO2 22 20* 23  GLUCOSE 153* 121* 164*  BUN 77* 84* 56*  CREATININE 7.08* 7.51* 6.18*  CALCIUM 9.8 9.3 8.5*  PHOS  --   --  7.6*    GFR: Estimated Creatinine Clearance: 16 mL/min (A) (by C-G formula based on SCr of 6.18 mg/dL (H)).  Liver Function Tests: Recent Labs  Lab 05/21/21 0856  ALBUMIN 3.4*    CBG: Recent Labs  Lab 05/20/21 1305 05/20/21 1634 05/20/21 2144 05/21/21 0649 05/21/21 1325  GLUCAP 185* 370* 206* 110* 191*     Recent Results (from the past 240 hour(s))  Resp Panel by RT-PCR (Flu A&B, Covid) Nasopharyngeal Swab     Status: None   Collection Time: 05/19/21  8:50 PM   Specimen: Nasopharyngeal Swab; Nasopharyngeal(NP) swabs in vial transport medium  Result Value Ref Range Status   SARS Coronavirus 2 by RT PCR NEGATIVE NEGATIVE Final    Comment: (NOTE) SARS-CoV-2 target nucleic acids are NOT DETECTED.  The SARS-CoV-2 RNA is generally detectable in upper respiratory specimens during the acute phase of infection. The lowest concentration of SARS-CoV-2 viral copies this assay can detect is 138 copies/mL. A negative result does not preclude SARS-Cov-2 infection and should not be used as the sole basis for treatment or other patient management decisions. A negative result may occur with  improper specimen collection/handling, submission of specimen other than  nasopharyngeal swab, presence of viral mutation(s) within the areas targeted by this assay, and inadequate number of viral copies(<138 copies/mL). A negative result must be combined with clinical observations, patient history, and epidemiological information. The expected result is Negative.  Fact Sheet for Patients:  EntrepreneurPulse.com.au  Fact Sheet for Healthcare Providers:  IncredibleEmployment.be  This test is no t yet approved or cleared by the [place] FDA and  has been authorized for detection and/or diagnosis of SARS-CoV-2 by FDA under an Emergency Use Authorization (EUA). This EUA will remain  in effect (meaning this test can be used) for the duration of the COVID-19 declaration under Section 564(b)(1) of  the Act, 21 U.S.C.section 360bbb-3(b)(1), unless the authorization is terminated  or revoked sooner.       Influenza A by PCR NEGATIVE NEGATIVE Final   Influenza B by PCR NEGATIVE NEGATIVE Final    Comment: (NOTE) The Xpert Xpress SARS-CoV-2/FLU/RSV plus assay is intended as an aid in the diagnosis of influenza from Nasopharyngeal swab specimens and should not be used as a sole basis for treatment. Nasal washings and aspirates are unacceptable for Xpert Xpress SARS-CoV-2/FLU/RSV testing.  Fact Sheet for Patients: EntrepreneurPulse.com.au  Fact Sheet for Healthcare Providers: IncredibleEmployment.be  This test is not yet approved or cleared by the [place] FDA and has been authorized for detection and/or diagnosis of SARS-CoV-2 by FDA under an Emergency Use Authorization (EUA). This EUA will remain in effect (meaning this test can be used) for the duration of the COVID-19 declaration under Section 564(b)(1) of the Act, 21 U.S.C. section 360bbb-3(b)(1), unless the authorization is terminated or revoked.  Performed at [facility], [place] [address], [place], [place] [address]           Radiology Studies: DG Tibia/Fibula Right  Result Date: 05/19/2021 CLINICAL DATA:  Osteomyelitis EXAM: RIGHT TIBIA AND FIBULA - 2 VIEW COMPARISON:  None. FINDINGS: Normal alignment. No fracture or dislocation. No osseous erosion identified. Advanced vascular calcifications are seen within the soft tissues of the right lower extremity and visualized right foot. There is diffuse mild subcutaneous edema of the right lower extremity. Incidentally noted is an os ossific curvilinear density adjacent to the medial femoral condyle in keeping with a Pellegrini-Stieda lesion related to remote MCL injury. IMPRESSION: No osseous erosion identified to suggest osteomyelitis. Electronically Signed   By: Fidela Salisbury M.D.   On: 05/19/2021 20:29        Scheduled Meds:  (feeding supplement) PROSource Plus  30 mL Oral TID BM   calcitRIOL  1.5 mcg Oral Q M,W,F-HD   Chlorhexidine Gluconate Cloth  6 each Topical Q0600   cinacalcet  120 mg Oral Q supper   darbepoetin (ARANESP) injection - DIALYSIS  100 mcg Intravenous Q Wed-HD   feeding supplement (NEPRO CARB STEADY)  237 mL Oral BID BM   gabapentin  100 mg Oral TID   heparin  5,000 Units Subcutaneous Q8H   hydrocerin   Topical Daily   insulin aspart  0-9 Units Subcutaneous TID WC   insulin aspart  4 Units Subcutaneous TID WC   metroNIDAZOLE  500 mg Oral Q8H   multivitamin  1 tablet Oral QHS   nutrition supplement (JUVEN)  1 packet Oral BID BM   sevelamer carbonate  2,400 mg Oral With snacks   sevelamer carbonate  4,000 mg Oral TID WC   Continuous Infusions:  cefTRIAXone (ROCEPHIN)  IV 2 g (05/20/21 2040)   [START ON 05/23/2021] vancomycin       LOS: 2 days       Hosie Poisson, MD [facility]   To contact the attending provider between 7A-7P or the covering provider during after hours 7P-7A, please log into the web site [email or web address] and access using universal [place] password for that web site. If you do not have the  password, please call the hospital operator.  05/21/2021, 2:46 PM

## 2021-05-22 ENCOUNTER — Inpatient Hospital Stay (HOSPITAL_COMMUNITY): Payer: MEDICARE

## 2021-05-22 DIAGNOSIS — Z992 Dependence on renal dialysis: Secondary | ICD-10-CM | POA: Diagnosis not present

## 2021-05-22 DIAGNOSIS — I739 Peripheral vascular disease, unspecified: Secondary | ICD-10-CM | POA: Diagnosis not present

## 2021-05-22 DIAGNOSIS — E1022 Type 1 diabetes mellitus with diabetic chronic kidney disease: Secondary | ICD-10-CM | POA: Diagnosis not present

## 2021-05-22 DIAGNOSIS — E118 Type 2 diabetes mellitus with unspecified complications: Secondary | ICD-10-CM | POA: Diagnosis not present

## 2021-05-22 DIAGNOSIS — L03115 Cellulitis of right lower limb: Secondary | ICD-10-CM | POA: Diagnosis not present

## 2021-05-22 DIAGNOSIS — N186 End stage renal disease: Secondary | ICD-10-CM | POA: Diagnosis not present

## 2021-05-22 LAB — GLUCOSE, CAPILLARY
Glucose-Capillary: 330 mg/dL — ABNORMAL HIGH (ref 70–99)
Glucose-Capillary: 312 mg/dL — ABNORMAL HIGH (ref 70–99)
Glucose-Capillary: 262 mg/dL — ABNORMAL HIGH (ref 70–99)
Glucose-Capillary: 194 mg/dL — ABNORMAL HIGH (ref 70–99)
Glucose-Capillary: 180 mg/dL — ABNORMAL HIGH (ref 70–99)
Glucose-Capillary: 280 mg/dL — ABNORMAL HIGH (ref 70–99)

## 2021-05-22 MED ORDER — INSULIN ASPART 100 UNIT/ML IJ SOLN: 6.0000 [IU] | Freq: Three times a day (TID) | INTRAMUSCULAR | Status: DC

## 2021-05-22 MED ORDER — METRONIDAZOLE 500 MG PO TABS
500.0000 mg | ORAL_TABLET | Freq: Two times a day (BID) | ORAL | Status: DC
  Administered 2021-05-22 – 2021-05-23 (×2): 500 mg via ORAL
  Filled 2021-05-22 (×2): qty 1

## 2021-05-22 MED ORDER — HYDROCODONE-ACETAMINOPHEN 5-325 MG PO TABS
1.0000 | ORAL_TABLET | ORAL | Status: DC | PRN
Start: 2021-05-22 — End: 2021-05-23

## 2021-05-22 MED ORDER — INSULIN ASPART 100 UNIT/ML IJ SOLN
0.0000 [IU] | Freq: Three times a day (TID) | INTRAMUSCULAR | Status: DC
Start: 2021-05-22 — End: 2021-05-23
  Administered 2021-05-22: 3 [IU] via SUBCUTANEOUS
  Administered 2021-05-23: 2 [IU] via SUBCUTANEOUS
  Administered 2021-05-23: 9 [IU] via SUBCUTANEOUS

## 2021-05-22 MED ORDER — INSULIN ASPART 100 UNIT/ML IJ SOLN: 0.0000 [IU] | Freq: Every day | INTRAMUSCULAR | Status: DC

## 2021-05-22 MED ORDER — HYDROMORPHONE HCL 1 MG/ML IJ SOLN
0.5000 mg | INTRAMUSCULAR | Status: DC | PRN
  Administered 2021-05-23 (×2): 0.5 mg via INTRAVENOUS
  Filled 2021-05-22 (×3): qty 0.5

## 2021-05-22 MED ORDER — INSULIN ASPART 100 UNIT/ML IJ SOLN: 7.0000 [IU] | Freq: Three times a day (TID) | INTRAMUSCULAR | Status: DC

## 2021-05-22 NOTE — Progress Notes (Signed)
Out Patient Arrangements:  Pt has been accepted @ [facility] on their TTS shift w/ a time of 11:15 am.  The address is  [address]  [place], [place] [address]  Phone: [phone]   Please advise of d/c date.  Linus Orn HPSS [phone]

## 2021-05-22 NOTE — Progress Notes (Signed)
PROGRESS NOTE    [name]  WUX:[id number] DOB: 07/23/1981 DOA: 05/19/2021 PCP: Kerin Perna, NP    No chief complaint on file.   Brief Narrative:  40 year old gentleman prior history of end-stage renal disease on dialysis, type 2 diabetes mellitus, noncompliant to medications admitted for right leg cellulitis.  Patient was empirically started on IV antibiotics and MRI of the leg ordered.  Assessment & Plan:   Principal Problem:   Cellulitis of right leg Active Problems:   Type 2 diabetes mellitus with complication, without long-term current use of insulin ([name])   ESRD on dialysis ([place])   Cellulitis of the right lower extremity Patient was started on broad-spectrum IV antibiotics. MRI of the leg ordered and is negative.  Pain control and wound care. Patient afebrile and WBC count within normal limits. ABI ordered and done.    End-stage renal disease on dialysis Nephrology on board. Plan for HD tomorrow.     Type 2 diabetes mellitus Insulin-dependent CBG (last 3)  Recent Labs    05/22/21 0614 05/22/21 0801 05/22/21 1119  GLUCAP 330* 312* 262*    Increase the novolog to 7 units tidac , and continue with SSI.   History of left AKA Recommend outpatient follow-up with vascular surgery as recommended  Anemia of chronic disease probably secondary to end-stage renal disease Transfuse to keep hemoglobin greater than 7.    Thrombocytopenia Mild, continue to monitor.   DVT prophylaxis: (Heparin.  Code Status: (Full Code) Family Communication: none at bedside.  Disposition:   Status is: Inpatient  Remains inpatient appropriate because:Ongoing diagnostic testing needed not appropriate for outpatient work up, Unsafe d/c plan, and IV treatments appropriate due to intensity of illness or inability to take PO  Dispo: The patient is from: Home              Anticipated d/c is to: Home              Patient currently is not medically stable to d/c.    Difficult to place patient No       Consultants:  Nephrology.   Procedures: MRI of the foot.   Antimicrobials:  Antibiotics Given (last 72 hours)     Date/Time Action Medication Dose Rate   05/19/21 2204 Given   metroNIDAZOLE (FLAGYL) tablet 500 mg 500 mg    05/19/21 2230 New Bag/Given   cefTRIAXone (ROCEPHIN) 2 g in sodium chloride 0.9 % 100 mL IVPB 2 g 200 mL/hr   05/19/21 2304 New Bag/Given   vancomycin (VANCOREADY) IVPB 1750 mg/350 mL 1,750 mg 175 mL/hr   05/20/21 0617 Given   metroNIDAZOLE (FLAGYL) tablet 500 mg 500 mg    05/20/21 1152 New Bag/Given   vancomycin (VANCOCIN) IVPB 1000 mg/200 mL premix 1,000 mg 200 mL/hr   05/20/21 1456 Given   metroNIDAZOLE (FLAGYL) tablet 500 mg 500 mg    05/20/21 2040 New Bag/Given   cefTRIAXone (ROCEPHIN) 2 g in sodium chloride 0.9 % 100 mL IVPB 2 g 200 mL/hr   05/20/21 2111 Given   metroNIDAZOLE (FLAGYL) tablet 500 mg 500 mg    05/21/21 0645 Given   metroNIDAZOLE (FLAGYL) tablet 500 mg 500 mg    05/21/21 1209 New Bag/Given  [given during HD.]   vancomycin (VANCOCIN) IVPB 1000 mg/200 mL premix 1,000 mg 200 mL/hr   05/21/21 1316 Given   metroNIDAZOLE (FLAGYL) tablet 500 mg 500 mg    05/21/21 2131 Given   metroNIDAZOLE (FLAGYL) tablet 500 mg 500 mg  05/21/21 2139 New Bag/Given   cefTRIAXone (ROCEPHIN) 2 g in sodium chloride 0.9 % 100 mL IVPB 2 g 200 mL/hr   05/22/21 0615 Given   metroNIDAZOLE (FLAGYL) tablet 500 mg 500 mg          Subjective: Wants to go home tomorrow.   Objective: Vitals:   05/21/21 1459 05/21/21 1700 05/21/21 1711 05/21/21 2110  BP: (!) 108/93 (!) 126/46 (!) 126/42 135/90  Pulse: (!) 55  90 98  Resp:   16 18  Temp: (!) 97.5 F (36.4 C)   98.1 F (36.7 C)  TempSrc: Oral   Oral  SpO2: 93%  92% 98%  Weight:       No intake or output data in the 24 hours ending 05/22/21 1834  Filed Weights   05/20/21 1230 05/21/21 0920 05/21/21 1230  Weight: 87.8 kg 88 kg 84 kg    Examination:  General  exam: alert and comfortable.  Respiratory system: air entry fair, no wheezing heard.  Cardiovascular system: S1 & S2 heard, RRR no JVD,  Gastrointestinal system: Abdomen is soft, non tender, Central nervous system: Alert and oriented,  Extremities: left AKA, right leg cellulitis improving.  Skin: erythematous and warm,  Psychiatry: Mood is appropriate.     Data Reviewed: I have personally reviewed following labs and imaging studies  CBC: Recent Labs  Lab 05/19/21 1308 05/20/21 0149 05/21/21 0856  WBC 6.5 5.5 4.9  NEUTROABS 4.1  --   --   HGB 10.2* 10.0* 9.7*  HCT 34.0* 33.6* 32.7*  MCV 94.7 94.1 95.3  PLT 125* 140* 127*     Basic Metabolic Panel: Recent Labs  Lab 05/19/21 1308 05/20/21 0149 05/21/21 0856  NA 133* 129* 133*  K 4.9 5.6* 4.9  CL 95* 93* 96*  CO2 22 20* 23  GLUCOSE 153* 121* 164*  BUN 77* 84* 56*  CREATININE 7.08* 7.51* 6.18*  CALCIUM 9.8 9.3 8.5*  PHOS  --   --  7.6*     GFR: Estimated Creatinine Clearance: 16 mL/min (A) (by C-G formula based on SCr of 6.18 mg/dL (H)).  Liver Function Tests: Recent Labs  Lab 05/21/21 0856  ALBUMIN 3.4*     CBG: Recent Labs  Lab 05/21/21 1627 05/21/21 2114 05/22/21 0614 05/22/21 0801 05/22/21 1119  GLUCAP 339* 241* 330* 312* 262*      Recent Results (from the past 240 hour(s))  Resp Panel by RT-PCR (Flu A&B, Covid) Nasopharyngeal Swab     Status: None   Collection Time: 05/19/21  8:50 PM   Specimen: Nasopharyngeal Swab; Nasopharyngeal(NP) swabs in vial transport medium  Result Value Ref Range Status   SARS Coronavirus 2 by RT PCR NEGATIVE NEGATIVE Final    Comment: (NOTE) SARS-CoV-2 target nucleic acids are NOT DETECTED.  The SARS-CoV-2 RNA is generally detectable in upper respiratory specimens during the acute phase of infection. The lowest concentration of SARS-CoV-2 viral copies this assay can detect is 138 copies/mL. A negative result does not preclude SARS-Cov-2 infection and should  not be used as the sole basis for treatment or other patient management decisions. A negative result may occur with  improper specimen collection/handling, submission of specimen other than nasopharyngeal swab, presence of viral mutation(s) within the areas targeted by this assay, and inadequate number of viral copies(<138 copies/mL). A negative result must be combined with clinical observations, patient history, and epidemiological information. The expected result is Negative.  Fact Sheet for Patients:  EntrepreneurPulse.com.au  Fact Sheet for Healthcare Providers:  IncredibleEmployment.be  This test is no t yet approved or cleared by the [place] and  has been authorized for detection and/or diagnosis of SARS-CoV-2 by FDA under an Emergency Use Authorization (EUA). This EUA will remain  in effect (meaning this test can be used) for the duration of the COVID-19 declaration under Section 564(b)(1) of the Act, 21 U.S.C.section 360bbb-3(b)(1), unless the authorization is terminated  or revoked sooner.       Influenza A by PCR NEGATIVE NEGATIVE Final   Influenza B by PCR NEGATIVE NEGATIVE Final    Comment: (NOTE) The Xpert Xpress SARS-CoV-2/FLU/RSV plus assay is intended as an aid in the diagnosis of influenza from Nasopharyngeal swab specimens and should not be used as a sole basis for treatment. Nasal washings and aspirates are unacceptable for Xpert Xpress SARS-CoV-2/FLU/RSV testing.  Fact Sheet for Patients: EntrepreneurPulse.com.au  Fact Sheet for Healthcare Providers: IncredibleEmployment.be  This test is not yet approved or cleared by the [place] FDA and has been authorized for detection and/or diagnosis of SARS-CoV-2 by FDA under an Emergency Use Authorization (EUA). This EUA will remain in effect (meaning this test can be used) for the duration of the COVID-19 declaration under  Section 564(b)(1) of the Act, 21 U.S.C. section 360bbb-3(b)(1), unless the authorization is terminated or revoked.  Performed at [facility], [place] [address], [place], [place] [address]           Radiology Studies: MR TIBIA FIBULA RIGHT WO CONTRAST  Result Date: 05/22/2021 CLINICAL DATA:  Osteomyelitis suspected, tib/fib, xray done EXAM: MRI OF LOWER RIGHT EXTREMITY WITHOUT CONTRAST TECHNIQUE: Multiplanar, multisequence MR imaging of the right tibia fibula was performed. No intravenous contrast was administered. COMPARISON:  X-ray 05/19/2021 FINDINGS: Technical note: Despite efforts by the technologist and patient, motion artifact is present on today's exam and could not be eliminated. This reduces exam sensitivity and specificity. Bones/Joint/Cartilage No acute fracture. No evidence of bony destruction. No marrow replacement. No bone marrow edema or periostitis. Ligaments Not well evaluated at the edges of the field of view. Muscles and Tendons Diffuse fatty infiltration of the lower leg musculature suggesting denervation. Mildly increased T2 signal within the musculature of the mid to distal aspect of the lower leg. No intramuscular fluid collection. No appreciable tendinous abnormality on limited views. Soft tissues Subcutaneous edema and ill-defined fluid is most pronounced along the medial and anterior aspects of the mid to distal lower leg. No organized fluid collection. IMPRESSION: 1. Motion degraded exam. 2. No evidence of osteomyelitis of the right tibia or fibula. 3. Subcutaneous edema and ill-defined fluid is most pronounced along the medial and anterior aspects of the mid to distal lower leg, which may represent cellulitis. No organized fluid collection. 4. Diffuse fatty infiltration of the lower leg musculature suggesting denervation. Mildly increased T2 signal within the musculature of the mid to distal aspect of the lower leg may represent a combination of denervation changes  and/or myositis. Electronically Signed   By: Davina Poke D.O.   On: 05/22/2021 08:15   VAS [place] ABI WITH/WO TBI  Result Date: 05/22/2021  LOWER EXTREMITY DOPPLER STUDY Patient Name:  FREDI GEILER  Date of Exam:   05/22/2021 Medical Rec #: [id number]      Accession #:    [id number] Date of Birth: June 17, 1981     Patient Gender: M Patient Age:   43 years Exam Location:  [facility] Procedure:      VAS [place] ABI WITH/WO TBI Referring  Phys: Jennette Kettle --------------------------------------------------------------------------------  Indications: Peripheral artery disease, and left AKA.  Comparison Study: 10/27/2017 ABI- right ABI= noncompressible, right TBI= 0.69 Performing Technologist: Maudry Mayhew MHA, RVT, RDCS, RDMS  Examination Guidelines: A complete evaluation includes at minimum, Doppler waveform signals and systolic blood pressure reading at the level of bilateral brachial, anterior tibial, and posterior tibial arteries, when vessel segments are accessible. Bilateral testing is considered an integral part of a complete examination. Photoelectric Plethysmograph (PPG) waveforms and toe systolic pressure readings are included as required and additional duplex testing as needed. Limited examinations for reoccurring indications may be performed as noted.  ABI Findings: +---------+------------------+-----+---------+--------+ Right    Rt Pressure (mmHg)IndexWaveform Comment  +---------+------------------+-----+---------+--------+ Brachial 166                    triphasic         +---------+------------------+-----+---------+--------+ PTA                             absent            +---------+------------------+-----+---------+--------+ DP       Noncompressible        triphasic         +---------+------------------+-----+---------+--------+ Great Toe74                0.45                   +---------+------------------+-----+---------+--------+  +--------+------------------+-----+--------+-----------------------------------+ Left    Lt Pressure (mmHg)IndexWaveformComment                             +--------+------------------+-----+--------+-----------------------------------+ Brachial                               Unable to assess due to IV location +--------+------------------+-----+--------+-----------------------------------+ +-------+---------------+-----------+------------+------------+ ABI/TBIToday's ABI    Today's TBIPrevious ABIPrevious TBI +-------+---------------+-----------+------------+------------+ Right  Noncompressible0.45                                +-------+---------------+-----------+------------+------------+ Left   AKA            AKA                                 +-------+---------------+-----------+------------+------------+  Summary: Right: Resting right ankle-brachial index indicates noncompressible right lower extremity arteries. The right toe-brachial index is abnormal.  *See table(s) above for measurements and observations.  Electronically signed by Jamelle Haring on 05/22/2021 at 5:40:15 PM.    Final         Scheduled Meds:  (feeding supplement) PROSource Plus  30 mL Oral TID BM   calcitRIOL  1.5 mcg Oral Q M,W,F-HD   Chlorhexidine Gluconate Cloth  6 each Topical Q0600   cinacalcet  120 mg Oral Q supper   darbepoetin (ARANESP) injection - DIALYSIS  100 mcg Intravenous Q Wed-HD   feeding supplement (NEPRO CARB STEADY)  237 mL Oral BID BM   gabapentin  100 mg Oral TID   heparin  5,000 Units Subcutaneous Q8H   hydrocerin   Topical Daily   insulin aspart  0-15 Units Subcutaneous TID WC   insulin aspart  0-5 Units Subcutaneous QHS   insulin aspart  6 Units Subcutaneous TID WC   metroNIDAZOLE  500  mg Oral Q12H   multivitamin  1 tablet Oral QHS   nutrition supplement (JUVEN)  1 packet Oral BID BM   sevelamer carbonate  2,400 mg Oral With snacks   sevelamer carbonate  4,000 mg Oral  TID WC   Continuous Infusions:  cefTRIAXone (ROCEPHIN)  IV 2 g (05/21/21 2139)   [START ON 05/23/2021] vancomycin       LOS: 3 days       Hosie Poisson, MD [facility]   To contact the attending provider between 7A-7P or the covering provider during after hours 7P-7A, please log into the web site [email or web address] and access using universal [place] password for that web site. If you do not have the password, please call the hospital operator.  05/22/2021, 6:34 PM

## 2021-05-22 NOTE — Progress Notes (Signed)
ABI completed. Refer to "CV Proc" under chart review to view preliminary results.  05/22/2021 4:32 PM Kelby Aline., MHA, RVT, RDCS, RDMS

## 2021-05-22 NOTE — Progress Notes (Signed)
Inpatient Diabetes Program Recommendations  AACE/ADA: New Consensus Statement on Inpatient Glycemic Control   Target Ranges:  Prepandial:   less than 140 mg/dL      Peak postprandial:   less than 180 mg/dL (1-2 hours)      Critically ill patients:  140 - 180 mg/dL   Results for [name], [name] (MRN [id number]) as of 05/22/2021 09:56  Ref. Range 05/21/2021 06:49 05/21/2021 13:25 05/21/2021 16:27 05/21/2021 21:14 05/22/2021 06:14 05/22/2021 08:01  Glucose-Capillary Latest Ref Range: 70 - 99 mg/dL 110 (H) 191 (H) 339 (H) 241 (H) 330 (H) 312 (H)   Review of Glycemic Control  Diabetes history: DM2 Outpatient Diabetes medications: Novolog 10 units TID with meals plus additional units for correction Current orders for Inpatient glycemic control: Novolog 0-9 units TID with meals, Novolog 4 units TID with meals   Inpatient Diabetes Program Recommendations:     Insulin: Please consider increasing meal coverage to Novolog 7 units TID with meals if patient eats at least 50% of meals. Also, please consider adding Novolog 0-5 units QHS for bedtime correction.  Thanks, Barnie Alderman, RN, MSN, CDE Diabetes Coordinator Inpatient Diabetes Program [phone] (Team Pager from 8am to 5pm)

## 2021-05-22 NOTE — Progress Notes (Signed)
Subjective: Awoken sleep no complaints dialysis yesterday tolerated 4.3 l UF, not short of breath .Next dialysis tomorrow on schedule  Objective Vital signs in last 24 hours: Vitals:   05/21/21 1459 05/21/21 1700 05/21/21 1711 05/21/21 2110  BP: (!) 108/93 (!) 126/46 (!) 126/42 135/90  Pulse: (!) 55  90 98  Resp:   16 18  Temp: (!) 97.5 F (36.4 C)   98.1 F (36.7 C)  TempSrc: Oral   Oral  SpO2: 93%  92% 98%  Weight:       Weight change: -7 kg  Physical Exam: General: Alert male appears chronically ill NAD Heart: RRR, 2/6 SEM, no RG Lungs: CTA nonlabored breathing  Abdomen: bowel sounds normoactive, soft NT, abdominal edema proving no significant distention Extremities: RLE edematous proved trace with dry clean bandages Lower leg, left AKA stump wrapped dry clean Dialysis Access:right IJ PermCath    OP Dialysis Orders: Center: [facility] , MWF, 4 hours 15 minutes EDW 75 kg Two 2K, 2 CA bath No heparin Mircera 100 MCG every 4 hours last given 8/03 Calcitriol 1.5 MCG p.o. q. Dialysis Right IJ Perm Cath /left upper arm AVG clotted (no current plans until all wounds are healed)     Problem/Plan: RLE cellulitis= MRI no osteo on IV antibiotics plans per admit ESRD -HD schedule MWF, a.m. K4.9 missed HD Monday 8/22, [facility] HD 8/23. , and 8/24 for volume Next dialysis tomorrow on schedule Hypertension/volume  -initial BP in ER 146/96, BP improved with UF about 1 L/23, 4.3 L UF 8/24 HD -no current BP meds, excess volume secondary to noncompliance with HD schedule and volume intake  Anemia  -Hgb 9.7 Aranesp, 3/76  Metabolic bone disease -continue vitamin D on dialysis /phosphorus 7.6 on phosphate binder, Sensipar,  Nutrition -per his hospital history adamantly refuses carb modified renal diet /follow-up phosphorus potassium does need fluid restriction, albumin 3.4 on protein supplements Diabetes mellitus= management per admit History of PAD with left AKA  [name], PA-C [facility] [phone] 05/22/2021,11:38 AM  LOS: 3 days   Labs: Basic Metabolic Panel: Recent Labs  Lab 05/19/21 1308 05/20/21 0149 05/21/21 0856  NA 133* 129* 133*  K 4.9 5.6* 4.9  CL 95* 93* 96*  CO2 22 20* 23  GLUCOSE 153* 121* 164*  BUN 77* 84* 56*  CREATININE 7.08* 7.51* 6.18*  CALCIUM 9.8 9.3 8.5*  PHOS  --   --  7.6*   Liver Function Tests: Recent Labs  Lab 05/21/21 0856  ALBUMIN 3.4*   No results for input(s): LIPASE, AMYLASE in the last 168 hours. No results for input(s): AMMONIA in the last 168 hours. CBC: Recent Labs  Lab 05/19/21 1308 05/20/21 0149 05/21/21 0856  WBC 6.5 5.5 4.9  NEUTROABS 4.1  --   --   HGB 10.2* 10.0* 9.7*  HCT 34.0* 33.6* 32.7*  MCV 94.7 94.1 95.3  PLT 125* 140* 127*   Cardiac Enzymes: No results for input(s): CKTOTAL, CKMB, CKMBINDEX, TROPONINI in the last 168 hours. CBG: Recent Labs  Lab 05/21/21 1627 05/21/21 2114 05/22/21 0614 05/22/21 0801 05/22/21 1119  GLUCAP 339* 241* 330* 312* 262*     Medications:  cefTRIAXone (ROCEPHIN)  IV 2 g (05/21/21 2139)   [START ON 05/23/2021] vancomycin      (feeding supplement) PROSource Plus  30 mL Oral TID BM   calcitRIOL  1.5 mcg Oral Q M,W,F-HD   Chlorhexidine Gluconate Cloth  6 each Topical Q0600   cinacalcet  120  mg Oral Q supper   darbepoetin (ARANESP) injection - DIALYSIS  100 mcg Intravenous Q Wed-HD   feeding supplement (NEPRO CARB STEADY)  237 mL Oral BID BM   gabapentin  100 mg Oral TID   heparin  5,000 Units Subcutaneous Q8H   hydrocerin   Topical Daily   insulin aspart  0-9 Units Subcutaneous TID WC   insulin aspart  4 Units Subcutaneous TID WC   metroNIDAZOLE  500 mg Oral Q12H   multivitamin  1 tablet Oral QHS   nutrition supplement (JUVEN)  1 packet Oral BID BM   sevelamer carbonate  2,400 mg Oral With snacks   sevelamer carbonate  4,000 mg Oral TID WC

## 2021-05-22 NOTE — Progress Notes (Signed)
PT Cancellation Note  Patient Details Name: [name] MRN: [id number] DOB: [birth year]   Cancelled Treatment:    Reason Eval/Treat Not Completed: Patient at procedure or test/unavailable Checked on pt 1630 and was off floor for testing.  Will f/u as able.  Abran Richard, PT Acute Rehab Services Pager [phone] [facility] [phone]    Karlton Lemon 05/22/2021, 4:41 PM

## 2021-05-22 NOTE — Progress Notes (Addendum)
Pharmacy Antibiotic Note  [name] is a 40 y.o. male admitted on 05/19/2021 with cellulitis.  Pharmacy has been consulted for vancomycin dosing. Also on ceftriaxone and metronidazole per team. Patient is ESRD on HD MWF.   Patient was dialyzed 8/23 and 8/24, received vancomycin post-HD both days. Next HD 8/26.    Plan: Vancomycin 1g post HD MWF F/u random Pre-HD vanc level 8/26  Monitor cultures, clinical status, renal fx Narrow abx as able and f/u duration    Weight: 84 kg (185 lb 3 oz)  Temp (24hrs), Avg:97.9 F (36.6 C), Min:97.5 F (36.4 C), Max:98.1 F (36.7 C)  Recent Labs  Lab 05/19/21 1308 05/20/21 0149 05/21/21 0856  WBC 6.5 5.5 4.9  CREATININE 7.08* 7.51* 6.18*     Estimated Creatinine Clearance: 16 mL/min (A) (by C-G formula based on SCr of 6.18 mg/dL (H)).    No Known Allergies  Antimicrobials this admission: vancomycin 8/22 >>  MTZ 8/22>> CTX 8/22 >>   Microbiology results:  None   Thank you for allowing pharmacy to be a part of this patient's care.  Benetta Spar, PharmD, BCPS, BCCP Clinical Pharmacist  Please check AMION for all [place] phone numbers After 10:00 PM, call [place] [phone]

## 2021-05-23 DIAGNOSIS — N186 End stage renal disease: Secondary | ICD-10-CM | POA: Diagnosis not present

## 2021-05-23 DIAGNOSIS — I10 Essential (primary) hypertension: Secondary | ICD-10-CM | POA: Diagnosis not present

## 2021-05-23 DIAGNOSIS — E1022 Type 1 diabetes mellitus with diabetic chronic kidney disease: Secondary | ICD-10-CM | POA: Diagnosis not present

## 2021-05-23 DIAGNOSIS — E118 Type 2 diabetes mellitus with unspecified complications: Secondary | ICD-10-CM | POA: Diagnosis not present

## 2021-05-23 DIAGNOSIS — L03115 Cellulitis of right lower limb: Secondary | ICD-10-CM | POA: Diagnosis not present

## 2021-05-23 DIAGNOSIS — Z992 Dependence on renal dialysis: Secondary | ICD-10-CM | POA: Diagnosis not present

## 2021-05-23 LAB — BASIC METABOLIC PANEL
Anion gap: 15 (ref 5–15)
BUN: 50 mg/dL — ABNORMAL HIGH (ref 6–20)
CO2: 24 mmol/L (ref 22–32)
Calcium: 8.7 mg/dL — ABNORMAL LOW (ref 8.9–10.3)
Chloride: 89 mmol/L — ABNORMAL LOW (ref 98–111)
Creatinine, Ser: 7.11 mg/dL — ABNORMAL HIGH (ref 0.61–1.24)
GFR, Estimated: 9 mL/min — ABNORMAL LOW (ref >60)
Glucose, Bld: 418 mg/dL — ABNORMAL HIGH (ref 70–99)
Potassium: 4.9 mmol/L (ref 3.5–5.1)
Sodium: 128 mmol/L — ABNORMAL LOW (ref 135–145)

## 2021-05-23 LAB — CBC WITH DIFFERENTIAL/PLATELET
Abs Immature Granulocytes: 0.02 10*3/uL (ref 0.00–0.07)
Basophils Absolute: 0 10*3/uL (ref 0.0–0.1)
Basophils Relative: 1 %
Eosinophils Absolute: 0.6 10*3/uL — ABNORMAL HIGH (ref 0.0–0.5)
Eosinophils Relative: 9 %
HCT: 32.2 % — ABNORMAL LOW (ref 39.0–52.0)
Hemoglobin: 9.8 g/dL — ABNORMAL LOW (ref 13.0–17.0)
Immature Granulocytes: 0 %
Lymphocytes Relative: 13 %
Lymphs Abs: 0.8 10*3/uL (ref 0.7–4.0)
MCH: 28.6 pg (ref 26.0–34.0)
MCHC: 30.4 g/dL (ref 30.0–36.0)
MCV: 93.9 fL (ref 80.0–100.0)
Monocytes Absolute: 0.7 10*3/uL (ref 0.1–1.0)
Monocytes Relative: 11 %
Neutro Abs: 4.1 10*3/uL (ref 1.7–7.7)
Neutrophils Relative %: 66 %
Platelets: 144 10*3/uL — ABNORMAL LOW (ref 150–400)
RBC: 3.43 MIL/uL — ABNORMAL LOW (ref 4.22–5.81)
RDW: 16.5 % — ABNORMAL HIGH (ref 11.5–15.5)
WBC: 6.2 10*3/uL (ref 4.0–10.5)
nRBC: 0 % (ref 0.0–0.2)

## 2021-05-23 LAB — GLUCOSE, CAPILLARY
Glucose-Capillary: 388 mg/dL — ABNORMAL HIGH (ref 70–99)
Glucose-Capillary: 29 mg/dL — CL (ref 70–99)
Glucose-Capillary: 42 mg/dL — CL (ref 70–99)
Glucose-Capillary: 276 mg/dL — ABNORMAL HIGH (ref 70–99)

## 2021-05-23 LAB — VANCOMYCIN, RANDOM: Vancomycin Rm: 24

## 2021-05-23 MED ORDER — ALTEPLASE 2 MG IJ SOLR
2.0000 mg | Freq: Once | INTRAMUSCULAR | Status: DC | PRN
  Filled 2021-05-23: qty 2

## 2021-05-23 MED ORDER — RENA-VITE PO TABS: 1.0000 | ORAL_TABLET | Freq: Every day | ORAL | 0 refills | Status: DC

## 2021-05-23 MED ORDER — CALCITRIOL 0.5 MCG PO CAPS: 1.5000 ug | ORAL_CAPSULE | ORAL | Status: DC

## 2021-05-23 MED ORDER — SODIUM CHLORIDE 0.9 % IV SOLN: 100.0000 mL | INTRAVENOUS | Status: DC | PRN

## 2021-05-23 MED ORDER — HEPARIN SODIUM (PORCINE) 1000 UNIT/ML DIALYSIS
1000.0000 [IU] | INTRAMUSCULAR | Status: DC | PRN
  Filled 2021-05-23: qty 1

## 2021-05-23 MED ORDER — PROSOURCE PLUS PO LIQD: 30.0000 mL | Freq: Three times a day (TID) | ORAL | 2 refills | Status: AC

## 2021-05-23 MED ORDER — LIDOCAINE-PRILOCAINE 2.5-2.5 % EX CREA
1.0000 "application " | TOPICAL_CREAM | CUTANEOUS | Status: DC | PRN
  Filled 2021-05-23: qty 5

## 2021-05-23 MED ORDER — PENTAFLUOROPROP-TETRAFLUOROETH EX AERO: 1.0000 "application " | INHALATION_SPRAY | CUTANEOUS | Status: DC | PRN

## 2021-05-23 MED ORDER — HEPARIN SODIUM (PORCINE) 1000 UNIT/ML IJ SOLN
INTRAMUSCULAR | Status: AC
  Filled 2021-05-23: qty 4

## 2021-05-23 MED ORDER — SODIUM CHLORIDE 0.9 % IV SOLN: 2.0000 g | INTRAVENOUS | Status: DC

## 2021-05-23 MED ORDER — NEPRO/CARBSTEADY PO LIQD: 237.0000 mL | Freq: Two times a day (BID) | ORAL | 0 refills | Status: AC

## 2021-05-23 MED ORDER — JUVEN PO PACK: 1.0000 | PACK | Freq: Two times a day (BID) | ORAL | 3 refills | Status: AC

## 2021-05-23 MED ORDER — METRONIDAZOLE 500 MG PO TABS: 500.0000 mg | ORAL_TABLET | Freq: Two times a day (BID) | ORAL | 0 refills | Status: DC

## 2021-05-23 MED ORDER — VANCOMYCIN HCL IN DEXTROSE 1-5 GM/200ML-% IV SOLN: 1000.0000 mg | INTRAVENOUS | 0 refills | Status: AC

## 2021-05-23 MED ORDER — LIDOCAINE HCL (PF) 1 % IJ SOLN
5.0000 mL | INTRAMUSCULAR | Status: DC | PRN
  Filled 2021-05-23: qty 5

## 2021-05-23 NOTE — Plan of Care (Signed)
  Problem: Health Behavior/Discharge Planning: Goal: Ability to manage health-related needs will improve Outcome: Progressing   Problem: Clinical Measurements: Goal: Ability to maintain clinical measurements within normal limits will improve Outcome: Progressing   Problem: Activity: Goal: Risk for activity intolerance will decrease Outcome: Progressing   Problem: Nutrition: Goal: Adequate nutrition will be maintained Outcome: Progressing   Problem: Coping: Goal: Level of anxiety will decrease Outcome: Progressing   Problem: Elimination: Goal: Will not experience complications related to bowel motility Outcome: Progressing   Problem: Pain Managment: Goal: General experience of comfort will improve Outcome: Progressing

## 2021-05-23 NOTE — Progress Notes (Signed)
PT Cancellation Note  Patient Details Name: DEIONDRE HARROWER MRN: [id number] DOB: [birth year]   Cancelled Treatment:    Reason Eval/Treat Not Completed: (P) Patient at procedure or test/unavailable (off unit for HD, will f/u per POC.)   Jazira Maloney Eli Hose 05/23/2021, 10:17 AM  Erasmo Leventhal , PTA Acute Rehabilitation Services Pager [phone] Office [phone]

## 2021-05-23 NOTE — Progress Notes (Addendum)
Nutrition Follow-up  DOCUMENTATION CODES:   Not applicable  INTERVENTION:   -D/c Nepro -D/c Juven -Continue 30 ml Prosource Plus TID, each supplement provides 100 kcals and 15 grams protein -Renal MVI daily  NUTRITION DIAGNOSIS:   Increased nutrient needs related to wound healing as evidenced by estimated needs.  Ongoing  GOAL:   Patient will meet greater than or equal to 90% of their needs  Progressing   MONITOR:   PO intake, Supplement acceptance, Labs, Weight trends, Skin, I & O's  REASON FOR ASSESSMENT:   Consult Wound healing  ASSESSMENT:   40 yo male with a PMH of T2DM, ESRD on MWF dialysis, and L AKA who presents with cellulitis of the R leg.  Reviewed I/O's: +480 ml x 24 hours and -8.2 L since admission  Spoke with pt at bedside, who reports that he is feeling better after returning from HD; he is currently awaiting for his lunch. He reports good appetite and is able to select foods that he enjoys. Of note, pt has a history of diet non-compliance with renal diet. He also does not like most supplements, but has been agreeable to Prosource in the past, due to his familiarity of receiving it at HD.   PTA, he reports good appetite consuming 3 meals per day.   Noted EDW 75 kg. Pt is above EDW. Exam has improved greatly since last RD visit, which is favorable given pt's history of malnutrition prior to lt BKA.   Pt became very tearful at time of visit. He reports "some people just don't understand what others are going through". RD provided pt with comfort and emotional support and validated pt frustrations about living with multiple chronic illnesses. Pt thanked this RD for checking in on him.    Medications reviewed and include aranesp and renvela.   Labs reviewed: CBGS: 29-388 (inpatient orders for glycemic control are 0-15 units insulin aspart TID with meals, 0-5 units insulin aspart daily at bedtime, and 7 units insulin aspart TID with meals).    NUTRITION -  FOCUSED PHYSICAL EXAM:  Flowsheet Row Most Recent Value  Orbital Region No depletion  Upper Arm Region No depletion  Thoracic and Lumbar Region No depletion  Buccal Region No depletion  Temple Region No depletion  Clavicle Bone Region No depletion  Clavicle and Acromion Bone Region No depletion  Scapular Bone Region No depletion  Dorsal Hand No depletion  Patellar Region No depletion  Anterior Thigh Region No depletion  Posterior Calf Region No depletion  Edema (RD Assessment) Mild  Hair Reviewed  Eyes Reviewed  Mouth Reviewed  Skin Reviewed  Nails Reviewed       Diet Order:   Diet Order             Diet - low sodium heart healthy           Diet regular Room service appropriate? Yes; Fluid consistency: Thin  Diet effective now                   EDUCATION NEEDS:   No education needs have been identified at this time  Skin:  Skin Assessment: Skin Integrity Issues: Skin Integrity Issues:: Other (Comment) Other: R leg - cellulitis  Last BM:  05/22/21  Height:   Ht Readings from Last 1 Encounters:  02/13/21 5\' 6"  (1.676 m)    Weight:   Wt Readings from Last 1 Encounters:  05/23/21 92.7 kg   BMI:  Body mass index is 32.99 kg/m.  Estimated Nutritional Needs:   Kcal:  2100-2300  Protein:  90-105 grams  Fluid:  >2.1 L    Loistine Chance, RD, LDN, [place] Registered Dietitian II Certified Diabetes Care and Education Specialist Please refer to [facility] for RD and/or RD on-call/weekend/after hours pager

## 2021-05-23 NOTE — Progress Notes (Signed)
Provided discharge education/instructions to Pt and mother. Demonstrated dressing change to RLE to Pt and explained to mother, both stated understanding. All questions and concerns addressed. Pt not in acute distress, discharged home with belongings accompanied by mother.

## 2021-05-23 NOTE — Progress Notes (Addendum)
[facility] Progress Note   Subjective:   Patient seen and examined at bedside in dialysis.  Tolerating treatment well so far. L leg feeling better.  Denies n/v/d, fever, chills, SOB and chest pain.  Admits to drinking too much and needing to have more self control with mountain dew.     Objective Vitals:   05/23/21 0930 05/23/21 1000 05/23/21 1030 05/23/21 1100  BP: (!) 79/44 (!) 81/42 (!) 93/39 (!) 86/40  Pulse: (!) 105 92 93 96  Resp: 15 11 19  (!) 0  Temp:      TempSrc:      SpO2:      Weight:       Physical Exam General:well appearing male in NAD Heart:RRR, +3/7 systolic murmur Lungs:CTAB, nml WOB on RA Abdomen:soft, NTND Extremities:L BKA, 1+ RLE edema Dialysis Access: TDC in use   Filed Weights   05/21/21 0920 05/21/21 1230 05/23/21 0820  Weight: 88 kg 84 kg 92.7 kg    Intake/Output Summary (Last 24 hours) at 05/23/2021 1147 Last data filed at 05/23/2021 0823 Gross per 24 hour  Intake 695.15 ml  Output --  Net 695.15 ml    Additional Objective Labs: Basic Metabolic Panel: Recent Labs  Lab 05/20/21 0149 05/21/21 0856 05/23/21 0836  NA 129* 133* 128*  K 5.6* 4.9 4.9  CL 93* 96* 89*  CO2 20* 23 24  GLUCOSE 121* 164* 418*  BUN 84* 56* 50*  CREATININE 7.51* 6.18* 7.11*  CALCIUM 9.3 8.5* 8.7*  PHOS  --  7.6*  --    Liver Function Tests: Recent Labs  Lab 05/21/21 0856  ALBUMIN 3.4*    CBC: Recent Labs  Lab 05/19/21 1308 05/20/21 0149 05/21/21 0856 05/23/21 0836  WBC 6.5 5.5 4.9 6.2  NEUTROABS 4.1  --   --  4.1  HGB 10.2* 10.0* 9.7* 9.8*  HCT 34.0* 33.6* 32.7* 32.2*  MCV 94.7 94.1 95.3 93.9  PLT 125* 140* 127* 144*   CBG: Recent Labs  Lab 05/22/21 1119 05/22/21 1641 05/22/21 2051 05/22/21 2133 05/23/21 0751  GLUCAP 262* 194* 180* 280* 388*    Studies/Results: MR TIBIA FIBULA RIGHT WO CONTRAST  Result Date: 05/22/2021 CLINICAL DATA:  Osteomyelitis suspected, tib/fib, xray done EXAM: MRI OF LOWER RIGHT EXTREMITY  WITHOUT CONTRAST TECHNIQUE: Multiplanar, multisequence MR imaging of the right tibia fibula was performed. No intravenous contrast was administered. COMPARISON:  X-ray 05/19/2021 FINDINGS: Technical note: Despite efforts by the technologist and patient, motion artifact is present on today's exam and could not be eliminated. This reduces exam sensitivity and specificity. Bones/Joint/Cartilage No acute fracture. No evidence of bony destruction. No marrow replacement. No bone marrow edema or periostitis. Ligaments Not well evaluated at the edges of the field of view. Muscles and Tendons Diffuse fatty infiltration of the lower leg musculature suggesting denervation. Mildly increased T2 signal within the musculature of the mid to distal aspect of the lower leg. No intramuscular fluid collection. No appreciable tendinous abnormality on limited views. Soft tissues Subcutaneous edema and ill-defined fluid is most pronounced along the medial and anterior aspects of the mid to distal lower leg. No organized fluid collection. IMPRESSION: 1. Motion degraded exam. 2. No evidence of osteomyelitis of the right tibia or fibula. 3. Subcutaneous edema and ill-defined fluid is most pronounced along the medial and anterior aspects of the mid to distal lower leg, which may represent cellulitis. No organized fluid collection. 4. Diffuse fatty infiltration of the lower leg musculature suggesting denervation. Mildly increased T2 signal  within the musculature of the mid to distal aspect of the lower leg may represent a combination of denervation changes and/or myositis. Electronically Signed   By: Davina Poke D.O.   On: 05/22/2021 08:15   VAS [place] ABI WITH/WO TBI  Result Date: 05/22/2021  LOWER EXTREMITY DOPPLER STUDY Patient Name:  [name]  Date of Exam:   05/22/2021 Medical Rec #: [id number]      Accession #:    [id number] Date of Birth: 1980/11/20     Patient Gender: M Patient Age:   40 years Exam Location:  [facility] Procedure:      VAS [place] ABI WITH/WO TBI Referring Phys: Jennette Kettle --------------------------------------------------------------------------------  Indications: Peripheral artery disease, and left AKA.  Comparison Study: 10/27/2017 ABI- right ABI= noncompressible, right TBI= 0.69 Performing Technologist: Maudry Mayhew MHA, RVT, RDCS, RDMS  Examination Guidelines: A complete evaluation includes at minimum, Doppler waveform signals and systolic blood pressure reading at the level of bilateral brachial, anterior tibial, and posterior tibial arteries, when vessel segments are accessible. Bilateral testing is considered an integral part of a complete examination. Photoelectric Plethysmograph (PPG) waveforms and toe systolic pressure readings are included as required and additional duplex testing as needed. Limited examinations for reoccurring indications may be performed as noted.  ABI Findings: +---------+------------------+-----+---------+--------+ Right    Rt Pressure (mmHg)IndexWaveform Comment  +---------+------------------+-----+---------+--------+ Brachial 166                    triphasic         +---------+------------------+-----+---------+--------+ PTA                             absent            +---------+------------------+-----+---------+--------+ DP       Noncompressible        triphasic         +---------+------------------+-----+---------+--------+ Great Toe74                0.45                   +---------+------------------+-----+---------+--------+ +--------+------------------+-----+--------+-----------------------------------+ Left    Lt Pressure (mmHg)IndexWaveformComment                             +--------+------------------+-----+--------+-----------------------------------+ Brachial                               Unable to assess due to IV location +--------+------------------+-----+--------+-----------------------------------+  +-------+---------------+-----------+------------+------------+ ABI/TBIToday's ABI    Today's TBIPrevious ABIPrevious TBI +-------+---------------+-----------+------------+------------+ Right  Noncompressible0.45                                +-------+---------------+-----------+------------+------------+ Left   AKA            AKA                                 +-------+---------------+-----------+------------+------------+  Summary: Right: Resting right ankle-brachial index indicates noncompressible right lower extremity arteries. The right toe-brachial index is abnormal.  *See table(s) above for measurements and observations.  Electronically signed by Jamelle Haring on 05/22/2021 at 5:40:15 PM.    Final     Medications:  cefTRIAXone (ROCEPHIN)  IV Stopped (05/22/21 2214)   vancomycin      (  feeding supplement) PROSource Plus  30 mL Oral TID BM   calcitRIOL  1.5 mcg Oral Q M,W,F-HD   Chlorhexidine Gluconate Cloth  6 each Topical Q0600   cinacalcet  120 mg Oral Q supper   darbepoetin (ARANESP) injection - DIALYSIS  100 mcg Intravenous Q Wed-HD   feeding supplement (NEPRO CARB STEADY)  237 mL Oral BID BM   gabapentin  100 mg Oral TID   heparin  5,000 Units Subcutaneous Q8H   hydrocerin   Topical Daily   insulin aspart  0-15 Units Subcutaneous TID WC   insulin aspart  0-5 Units Subcutaneous QHS   insulin aspart  7 Units Subcutaneous TID WC   metroNIDAZOLE  500 mg Oral Q12H   multivitamin  1 tablet Oral QHS   nutrition supplement (JUVEN)  1 packet Oral BID BM   sevelamer carbonate  2,400 mg Oral With snacks   sevelamer carbonate  4,000 mg Oral TID WC    Dialysis Orders: [facility] MWF, 4 hours 15 minutes EDW 75 kg Two 2K, 2 CA bath No heparin Mircera 100 MCG q4wks last given 8/03 Calcitriol 1.5 MCG p.o. q. Dialysis Right IJ Perm Cath /left upper arm AVG clotted (no current plans until all wounds are healed)     Problem/Plan: RLE cellulitis- MRI no osteo.  Currently on Vanc  and rocephin, plan to change to vanc and fortaz at outpatient unit for additional week after discharge.   ESRD - usual MWF.  HD today per regular schedule.  K 4.9 this AM. Hypertension/volume  - not on antihypertensives at home.  BP in goal. Volume overload due to non compliance.  Counseled on fluid restrictions.  Anemia  -Hgb 9.8 Aranesp133mcg given 3/21  Metabolic bone disease - corrected Ca in goal, continue vitamin D w/HD and sensipar. phosphorus 7.6 on phosphate binder Nutrition -per his hospital history adamantly refuses carb modified renal diet /follow-up phosphorus potassium does need fluid restriction, albumin 3.4 on protein supplements Diabetes mellitus - management per admit History of PAD with left AKA  Jen Mow, PA-C [place] 05/23/2021,11:47 AM  LOS: 4 days

## 2021-05-23 NOTE — Progress Notes (Signed)
Hypoglycemic Event  CBG: 29  Treatment: 8 oz juice/soda  Symptoms: None  Follow-up CBG: Time:13:48 CBG Result:42  Possible Reasons for Event: Pt went to hemodialysis and did not eat breakfast  Comments/MD notified:Dr. Karleen Hampshire, V.  Pr refused for his CBG to be rechecked at 15:00. Pt currently eating his meal now and agreed to have it rechecked as soon as he is finished eating.   Avon Gully M Jayen Bromwell

## 2021-05-23 NOTE — Progress Notes (Signed)
Pharmacy Antibiotic Note  ZEPH RIEBEL is a 40 y.o. male admitted on 05/19/2021 with  wound infection .  Pharmacy has been consulted for Vanco dosing.   ID: Cellulitis/ulcers RLE- no OM, Subcutaneous edema and ill-defined fluid - Afebrile. WBC WNL, ESRD dosing. No cultures.  Vancomycin 8/22 >> Flagyl 8/22>> Rocephin 8/22>>  8/26: VR: 24 in goal pre-HD  8/22: COVID neg   Plan: Con't Vancomycin 1g IV MWF  CTX 2g Q24h MTZ 500 Q12     Weight: 92.7 kg (204 lb 5.9 oz)  Temp (24hrs), Avg:97.8 F (36.6 C), Min:97.5 F (36.4 C), Max:98 F (36.7 C)  Recent Labs  Lab 05/19/21 1308 05/20/21 0149 05/21/21 0856 05/23/21 0836  WBC 6.5 5.5 4.9 6.2  CREATININE 7.08* 7.51* 6.18* 7.11*  VANCORANDOM  --   --   --  24    Estimated Creatinine Clearance: 14.6 mL/min (A) (by C-G formula based on SCr of 7.11 mg/dL (H)).    No Known Allergies  Collyn Selk S. Alford Highland, PharmD, BCPS Clinical Staff Pharmacist [email or web address]   Eilene Ghazi Stillinger 05/23/2021 10:14 AM

## 2021-05-24 ENCOUNTER — Telehealth: Payer: Self-pay | Admitting: Nephrology

## 2021-05-24 DIAGNOSIS — I10 Essential (primary) hypertension: Secondary | ICD-10-CM | POA: Diagnosis not present

## 2021-05-24 NOTE — Telephone Encounter (Signed)
Transition of Care Contact from [place]   Date of Discharge: 05/23/21 Date of Contact: 05/24/21 Method of contact: phone Talked to patient   Patient contacted to discuss transition of care form recent hospitaliztion. Patient was admitted to [facility] from 05/20/21 to 05/23/21 with the discharge diagnosis of RLE cellulitis.     Medication changes were reviewed.  Patient will follow up with is outpatient dialysis center 05/26/21.  Other follow up needs include none identified.   Jen Mow, PA-C [name] Pager: [phone]

## 2021-05-25 DIAGNOSIS — I10 Essential (primary) hypertension: Secondary | ICD-10-CM | POA: Diagnosis not present

## 2021-05-26 ENCOUNTER — Telehealth: Payer: Self-pay

## 2021-05-26 DIAGNOSIS — Z992 Dependence on renal dialysis: Secondary | ICD-10-CM | POA: Diagnosis not present

## 2021-05-26 DIAGNOSIS — I10 Essential (primary) hypertension: Secondary | ICD-10-CM | POA: Diagnosis not present

## 2021-05-26 DIAGNOSIS — D688 Other specified coagulation defects: Secondary | ICD-10-CM | POA: Diagnosis not present

## 2021-05-26 DIAGNOSIS — R531 Weakness: Secondary | ICD-10-CM | POA: Diagnosis not present

## 2021-05-26 DIAGNOSIS — N186 End stage renal disease: Secondary | ICD-10-CM | POA: Diagnosis not present

## 2021-05-26 DIAGNOSIS — N2581 Secondary hyperparathyroidism of renal origin: Secondary | ICD-10-CM | POA: Diagnosis not present

## 2021-05-26 DIAGNOSIS — L03115 Cellulitis of right lower limb: Secondary | ICD-10-CM | POA: Diagnosis not present

## 2021-05-26 DIAGNOSIS — D631 Anemia in chronic kidney disease: Secondary | ICD-10-CM | POA: Diagnosis not present

## 2021-05-26 DIAGNOSIS — T8249XA Other complication of vascular dialysis catheter, initial encounter: Secondary | ICD-10-CM | POA: Diagnosis not present

## 2021-05-26 DIAGNOSIS — Z7401 Bed confinement status: Secondary | ICD-10-CM | POA: Diagnosis not present

## 2021-05-26 NOTE — Telephone Encounter (Signed)
Transition Care Management Unsuccessful Follow-up Telephone Call  Date of discharge and from where:  05/23/2021, [facility]   Attempts:  1st Attempt  Reason for unsuccessful TCM follow-up call:  Unable to reach patient - call placed x2 to # [phone], the recording states that the call has been redirected to [employer].  Need to discuss scheduling hospital follow up appointment with PCP

## 2021-05-27 ENCOUNTER — Telehealth: Payer: Self-pay

## 2021-05-27 DIAGNOSIS — I10 Essential (primary) hypertension: Secondary | ICD-10-CM | POA: Diagnosis not present

## 2021-05-27 NOTE — Telephone Encounter (Signed)
Transition Care Management Follow-up Telephone Call Date of discharge and from where: 05/23/2021, [facility]  How have you been since you were released from the hospital? He said he was feeling all right; but sleepy and couldn't talk now. He said he would call back and then hung up.

## 2021-05-28 ENCOUNTER — Telehealth: Payer: Self-pay

## 2021-05-28 DIAGNOSIS — L03115 Cellulitis of right lower limb: Secondary | ICD-10-CM | POA: Diagnosis not present

## 2021-05-28 DIAGNOSIS — Z743 Need for continuous supervision: Secondary | ICD-10-CM | POA: Diagnosis not present

## 2021-05-28 DIAGNOSIS — N2581 Secondary hyperparathyroidism of renal origin: Secondary | ICD-10-CM | POA: Diagnosis not present

## 2021-05-28 DIAGNOSIS — T8249XA Other complication of vascular dialysis catheter, initial encounter: Secondary | ICD-10-CM | POA: Diagnosis not present

## 2021-05-28 DIAGNOSIS — I129 Hypertensive chronic kidney disease with stage 1 through stage 4 chronic kidney disease, or unspecified chronic kidney disease: Secondary | ICD-10-CM | POA: Diagnosis not present

## 2021-05-28 DIAGNOSIS — N186 End stage renal disease: Secondary | ICD-10-CM | POA: Diagnosis not present

## 2021-05-28 DIAGNOSIS — I10 Essential (primary) hypertension: Secondary | ICD-10-CM | POA: Diagnosis not present

## 2021-05-28 DIAGNOSIS — D688 Other specified coagulation defects: Secondary | ICD-10-CM | POA: Diagnosis not present

## 2021-05-28 DIAGNOSIS — Z992 Dependence on renal dialysis: Secondary | ICD-10-CM | POA: Diagnosis not present

## 2021-05-28 DIAGNOSIS — D631 Anemia in chronic kidney disease: Secondary | ICD-10-CM | POA: Diagnosis not present

## 2021-05-28 DIAGNOSIS — R29898 Other symptoms and signs involving the musculoskeletal system: Secondary | ICD-10-CM | POA: Diagnosis not present

## 2021-05-28 NOTE — Telephone Encounter (Signed)
Transition Care Management Follow-up Telephone Call Date of discharge and from where: 05/23/2021, [facility]  How have you been since you were released from the hospital?  He said he was sleepy yesterday when this CM called and he hung up.  This CM called him again today.  He said he is feeling all right and was on his way to dialysis. When this CM started to ask him about his medications and discharge instructions. He said that one word answers should be sufficient and he is all right.  He said he has a good rapport with Ms Oletta Lamas, NP. He does not have a follow up appointment scheduled with her and when this CM asked him about scheduling an appointment, he hung up.

## 2021-05-29 DIAGNOSIS — I10 Essential (primary) hypertension: Secondary | ICD-10-CM | POA: Diagnosis not present

## 2021-05-29 DIAGNOSIS — R531 Weakness: Secondary | ICD-10-CM | POA: Diagnosis not present

## 2021-05-30 DIAGNOSIS — Z743 Need for continuous supervision: Secondary | ICD-10-CM | POA: Diagnosis not present

## 2021-05-30 DIAGNOSIS — D688 Other specified coagulation defects: Secondary | ICD-10-CM | POA: Diagnosis not present

## 2021-05-30 DIAGNOSIS — T8249XA Other complication of vascular dialysis catheter, initial encounter: Secondary | ICD-10-CM | POA: Diagnosis not present

## 2021-05-30 DIAGNOSIS — Z992 Dependence on renal dialysis: Secondary | ICD-10-CM | POA: Diagnosis not present

## 2021-05-30 DIAGNOSIS — I10 Essential (primary) hypertension: Secondary | ICD-10-CM | POA: Diagnosis not present

## 2021-05-30 DIAGNOSIS — Z7401 Bed confinement status: Secondary | ICD-10-CM | POA: Diagnosis not present

## 2021-05-30 DIAGNOSIS — R531 Weakness: Secondary | ICD-10-CM | POA: Diagnosis not present

## 2021-05-30 DIAGNOSIS — L03115 Cellulitis of right lower limb: Secondary | ICD-10-CM | POA: Diagnosis not present

## 2021-05-30 DIAGNOSIS — N2581 Secondary hyperparathyroidism of renal origin: Secondary | ICD-10-CM | POA: Diagnosis not present

## 2021-05-30 DIAGNOSIS — N186 End stage renal disease: Secondary | ICD-10-CM | POA: Diagnosis not present

## 2021-05-30 NOTE — Discharge Summary (Signed)
Physician Discharge Summary  [name]:[id number] DOB: 12-29-1980 DOA: 05/19/2021  PCP: Kerin Perna, NP  Admit date: 05/19/2021 Discharge date: 05/23/2021  Admitted From: Home.  Disposition:  Home.   Recommendations for Outpatient Follow-up:  Follow up with PCP in 1-2 weeks Please obtain BMP/CBC in one week 3. Please follow up with vascular surgery as recommended.    Discharge Condition:stable.  CODE STATUS:full code.  Diet recommendation: Heart Healthy   Brief/Interim Summary: 40 year old gentleman prior history of end-stage renal disease on dialysis, type 2 diabetes mellitus, noncompliant to medications admitted for right leg cellulitis.  Patient was empirically started on IV antibiotics and MRI of the leg ordered.    Discharge Diagnoses:  Principal Problem:   Cellulitis of right leg Active Problems:   Type 2 diabetes mellitus with complication, without long-term current use of insulin ([name])   ESRD on dialysis ([place])  Cellulitis of the right lower extremity Patient was started on broad-spectrum IV antibiotics and transitioned to oral antibiotics and discharged.  MRI of the leg ordered and is negative.  Pain control and wound care. Patient afebrile and WBC count within normal limits. ABI ordered and done, recommended to follo wup with vascular as outpatient.      End-stage renal disease on dialysis Nephrology on board.        Type 2 diabetes mellitus Insulin-dependent CBG (last 3)  Resume home medications.    History of left AKA Recommend outpatient follow-up with vascular surgery as recommended   Anemia of chronic disease probably secondary to end-stage renal disease Transfuse to keep hemoglobin greater than 7.      Thrombocytopenia Mild, continue to monitor.    Discharge Instructions  Discharge Instructions     Diet - low sodium heart healthy   Complete by: As directed    Discharge instructions   Complete by: As directed    Please  follow up with vascular surgery in 2 weeks.   Discharge wound care:   Complete by: As directed    Wound care to full thickness wound on right lateral LE:  Cleanse with soap and water, rinse and pat gently dry. COver with folded size appropriate piece of xeroform gauze Kellie Simmering # 294), top with dry gauze and secure with a few turns of Kerlix roll gauze/paper tape.  Change twice daily.   Increase activity slowly   Complete by: As directed       Allergies as of 05/23/2021   No Known Allergies      Medication List     TAKE these medications    calcitRIOL 0.5 MCG capsule Commonly known as: ROCALTROL Take 3 capsules (1.5 mcg total) by mouth every Monday, Wednesday, and Friday with hemodialysis. What changed: how much to take Notes to patient: Last dose given 08/26 04:43pm   cinacalcet 60 MG tablet Commonly known as: SENSIPAR Take 120 mg by mouth daily after supper.   feeding supplement (NEPRO CARB STEADY) Liqd Take 237 mLs by mouth 2 (two) times daily between meals.   nutrition supplement (JUVEN) Pack Take 1 packet by mouth 2 (two) times daily between meals.   (feeding supplement) PROSource Plus liquid Take 30 mLs by mouth 3 (three) times daily between meals.   gabapentin 100 MG capsule Commonly known as: Neurontin Take 1 capsule (100 mg total) by mouth 3 (three) times daily.   insulin aspart 100 UNIT/ML injection Commonly known as: NovoLOG Inject 10 Units into the skin 3 (three) times daily before meals. For blood sugars 0-150  give 0 units of insulin, 151-200 give 2 units of insulin, 201-250 give 4 units, 251-300 give 6 units, 301-350 give 8 units, 351-400 give 10 units,> 400 give 12 units and call M.D. Discussed hypoglycemia protocol.   metroNIDAZOLE 500 MG tablet Commonly known as: FLAGYL Take 1 tablet (500 mg total) by mouth every 12 (twelve) hours.   multivitamin Tabs tablet Take 1 tablet by mouth at bedtime.   OneTouch Delica Plus [id number] Misc Apply topically.    OneTouch Verio Flex System w/Device Kit 1 Bag by Does not apply route 3 (three) times daily before meals.   OneTouch Verio test strip Generic drug: glucose blood USE 1 STRIP TO CHECK GLUCOSE THREE TIMES DAILY   sevelamer carbonate 800 MG tablet Commonly known as: RENVELA Take 2,400-4,000 mg by mouth See admin instructions. Take 4,000 mg (5 tablets) by mouth with meals and 2,400 mg (3 tablets) with snacks Notes to patient: last dose given 08/26 04:45pm   vancomycin 1-5 GM/200ML-% Soln Commonly known as: VANCOCIN Inject 200 mLs (1,000 mg total) into the vein every Monday, Wednesday, and Friday with hemodialysis for 7 days.               Discharge Care Instructions  (From admission, onward)           Start     Ordered   05/23/21 0000  Discharge wound care:       Comments: Wound care to full thickness wound on right lateral LE:  Cleanse with soap and water, rinse and pat gently dry. COver with folded size appropriate piece of xeroform gauze Kellie Simmering # 294), top with dry gauze and secure with a few turns of Kerlix roll gauze/paper tape.  Change twice daily.   05/23/21 1354            Follow-up Information     Kerin Perna, NP. Schedule an appointment as soon as possible for a visit in 1 week(s).   Specialty: Internal Medicine Contact information: [address] [place] [address] [phone]         Elam Dutch, MD. Schedule an appointment as soon as possible for a visit in 1 week(s).   Specialties: Vascular Surgery, Cardiology Contact information: [address] [place] [address] [phone]                No Known Allergies  Consultations: None.    Procedures/Studies: DG Tibia/Fibula Right  Result Date: 05/19/2021 CLINICAL DATA:  Osteomyelitis EXAM: RIGHT TIBIA AND FIBULA - 2 VIEW COMPARISON:  None. FINDINGS: Normal alignment. No fracture or dislocation. No osseous erosion identified. Advanced vascular calcifications  are seen within the soft tissues of the right lower extremity and visualized right foot. There is diffuse mild subcutaneous edema of the right lower extremity. Incidentally noted is an os ossific curvilinear density adjacent to the medial femoral condyle in keeping with a Pellegrini-Stieda lesion related to remote MCL injury. IMPRESSION: No osseous erosion identified to suggest osteomyelitis. Electronically Signed   By: Fidela Salisbury M.D.   On: 05/19/2021 20:29   MR TIBIA FIBULA RIGHT WO CONTRAST  Result Date: 05/22/2021 CLINICAL DATA:  Osteomyelitis suspected, tib/fib, xray done EXAM: MRI OF LOWER RIGHT EXTREMITY WITHOUT CONTRAST TECHNIQUE: Multiplanar, multisequence MR imaging of the right tibia fibula was performed. No intravenous contrast was administered. COMPARISON:  X-ray 05/19/2021 FINDINGS: Technical note: Despite efforts by the technologist and patient, motion artifact is present on today's exam and could not be eliminated. This reduces exam sensitivity and  specificity. Bones/Joint/Cartilage No acute fracture. No evidence of bony destruction. No marrow replacement. No bone marrow edema or periostitis. Ligaments Not well evaluated at the edges of the field of view. Muscles and Tendons Diffuse fatty infiltration of the lower leg musculature suggesting denervation. Mildly increased T2 signal within the musculature of the mid to distal aspect of the lower leg. No intramuscular fluid collection. No appreciable tendinous abnormality on limited views. Soft tissues Subcutaneous edema and ill-defined fluid is most pronounced along the medial and anterior aspects of the mid to distal lower leg. No organized fluid collection. IMPRESSION: 1. Motion degraded exam. 2. No evidence of osteomyelitis of the right tibia or fibula. 3. Subcutaneous edema and ill-defined fluid is most pronounced along the medial and anterior aspects of the mid to distal lower leg, which may represent cellulitis. No organized fluid  collection. 4. Diffuse fatty infiltration of the lower leg musculature suggesting denervation. Mildly increased T2 signal within the musculature of the mid to distal aspect of the lower leg may represent a combination of denervation changes and/or myositis. Electronically Signed   By: Davina Poke D.O.   On: 05/22/2021 08:15   VAS [place] ABI WITH/WO TBI  Result Date: 05/22/2021  LOWER EXTREMITY DOPPLER STUDY Patient Name:  SHADEED COLBERG  Date of Exam:   05/22/2021 Medical Rec #: [id number]      Accession #:    [id number] Date of Birth: 05-27-1981     Patient Gender: M Patient Age:   89 years Exam Location:  [facility] Procedure:      VAS [place] ABI WITH/WO TBI Referring Phys: Jennette Kettle --------------------------------------------------------------------------------  Indications: Peripheral artery disease, and left AKA.  Comparison Study: 10/27/2017 ABI- right ABI= noncompressible, right TBI= 0.69 Performing Technologist: Maudry Mayhew MHA, RVT, RDCS, RDMS  Examination Guidelines: A complete evaluation includes at minimum, Doppler waveform signals and systolic blood pressure reading at the level of bilateral brachial, anterior tibial, and posterior tibial arteries, when vessel segments are accessible. Bilateral testing is considered an integral part of a complete examination. Photoelectric Plethysmograph (PPG) waveforms and toe systolic pressure readings are included as required and additional duplex testing as needed. Limited examinations for reoccurring indications may be performed as noted.  ABI Findings: +---------+------------------+-----+---------+--------+ Right    Rt Pressure (mmHg)IndexWaveform Comment  +---------+------------------+-----+---------+--------+ Brachial 166                    triphasic         +---------+------------------+-----+---------+--------+ PTA                             absent            +---------+------------------+-----+---------+--------+ DP        Noncompressible        triphasic         +---------+------------------+-----+---------+--------+ Great Toe74                0.45                   +---------+------------------+-----+---------+--------+ +--------+------------------+-----+--------+-----------------------------------+ Left    Lt Pressure (mmHg)IndexWaveformComment                             +--------+------------------+-----+--------+-----------------------------------+ Brachial                               Unable  to assess due to IV location +--------+------------------+-----+--------+-----------------------------------+ +-------+---------------+-----------+------------+------------+ ABI/TBIToday's ABI    Today's TBIPrevious ABIPrevious TBI +-------+---------------+-----------+------------+------------+ Right  Noncompressible0.45                                +-------+---------------+-----------+------------+------------+ Left   AKA            AKA                                 +-------+---------------+-----------+------------+------------+  Summary: Right: Resting right ankle-brachial index indicates noncompressible right lower extremity arteries. The right toe-brachial index is abnormal.  *See table(s) above for measurements and observations.  Electronically signed by Jamelle Haring on 05/22/2021 at 5:40:15 PM.    Final       Subjective: No new complaints.   Discharge Exam: Vitals:   05/23/21 1130 05/23/21 1246  BP: (!) 89/45 (!) 136/44  Pulse: 90 (!) 109  Resp: 20 18  Temp: 98.1 F (36.7 C) 99.1 F (37.3 C)  SpO2: 96% 93%   Vitals:   05/23/21 1100 05/23/21 1127 05/23/21 1130 05/23/21 1246  BP: (!) 86/40 (!) 83/45 (!) 89/45 (!) 136/44  Pulse: 96 90 90 (!) 109  Resp: (!) 0 '20 20 18  ' Temp:  98.1 F (36.7 C) 98.1 F (36.7 C) 99.1 F (37.3 C)  TempSrc:    Oral  SpO2:   96% 93%  Weight:   87.2 kg     General: Pt is alert, awake, not in acute distress Cardiovascular: RRR, S1/S2  +, no rubs, no gallops Respiratory: CTA bilaterally, no wheezing, no rhonchi Abdominal: Soft, NT, ND, bowel sounds + Extremities: no edema, no cyanosis    The results of significant diagnostics from this hospitalization (including imaging, microbiology, ancillary and laboratory) are listed below for reference.     Microbiology: No results found for this or any previous visit (from the past 240 hour(s)).   Labs: BNP (last 3 results) No results for input(s): BNP in the last 8760 hours. Basic Metabolic Panel: No results for input(s): NA, K, CL, CO2, GLUCOSE, BUN, CREATININE, CALCIUM, MG, PHOS in the last 168 hours. Liver Function Tests: No results for input(s): AST, ALT, ALKPHOS, BILITOT, PROT, ALBUMIN in the last 168 hours. No results for input(s): LIPASE, AMYLASE in the last 168 hours. No results for input(s): AMMONIA in the last 168 hours. CBC: No results for input(s): WBC, NEUTROABS, HGB, HCT, MCV, PLT in the last 168 hours. Cardiac Enzymes: No results for input(s): CKTOTAL, CKMB, CKMBINDEX, TROPONINI in the last 168 hours. BNP: Invalid input(s): POCBNP CBG: No results for input(s): GLUCAP in the last 168 hours. D-Dimer No results for input(s): DDIMER in the last 72 hours. Hgb A1c No results for input(s): HGBA1C in the last 72 hours. Lipid Profile No results for input(s): CHOL, HDL, LDLCALC, TRIG, CHOLHDL, LDLDIRECT in the last 72 hours. Thyroid function studies No results for input(s): TSH, T4TOTAL, T3FREE, THYROIDAB in the last 72 hours.  Invalid input(s): FREET3 Anemia work up No results for input(s): VITAMINB12, FOLATE, FERRITIN, TIBC, IRON, RETICCTPCT in the last 72 hours. Urinalysis    Component Value Date/Time   COLORURINE YELLOW 05/26/2015 1115   APPEARANCEUR CLEAR 05/26/2015 1115   APPEARANCEUR Hazy 07/30/2014 2303   LABSPEC 1.016 05/26/2015 1115   LABSPEC 1.012 07/30/2014 2303   PHURINE 8.0 05/26/2015 1115   GLUCOSEU 500 (A) 05/26/2015 1115  GLUCOSEU  >=500 07/30/2014 2303   HGBUR MODERATE (A) 05/26/2015 1115   BILIRUBINUR NEGATIVE 05/26/2015 1115   BILIRUBINUR Negative 07/30/2014 2303   KETONESUR 15 (A) 05/26/2015 1115   PROTEINUR >300 (A) 05/26/2015 1115   UROBILINOGEN 0.2 05/26/2015 1115   NITRITE NEGATIVE 05/26/2015 1115   LEUKOCYTESUR NEGATIVE 05/26/2015 1115   LEUKOCYTESUR Negative 07/30/2014 2303   Sepsis Labs Invalid input(s): PROCALCITONIN,  WBC,  LACTICIDVEN Microbiology No results found for this or any previous visit (from the past 240 hour(s)).   Time coordinating discharge: 34 minutes.   SIGNED:   Hosie Poisson, MD  [facility]

## 2021-05-31 DIAGNOSIS — I10 Essential (primary) hypertension: Secondary | ICD-10-CM | POA: Diagnosis not present

## 2021-06-01 DIAGNOSIS — I10 Essential (primary) hypertension: Secondary | ICD-10-CM | POA: Diagnosis not present

## 2021-06-02 DIAGNOSIS — D688 Other specified coagulation defects: Secondary | ICD-10-CM | POA: Diagnosis not present

## 2021-06-02 DIAGNOSIS — Z992 Dependence on renal dialysis: Secondary | ICD-10-CM | POA: Diagnosis not present

## 2021-06-02 DIAGNOSIS — T8249XA Other complication of vascular dialysis catheter, initial encounter: Secondary | ICD-10-CM | POA: Diagnosis not present

## 2021-06-02 DIAGNOSIS — Z743 Need for continuous supervision: Secondary | ICD-10-CM | POA: Diagnosis not present

## 2021-06-02 DIAGNOSIS — D631 Anemia in chronic kidney disease: Secondary | ICD-10-CM | POA: Diagnosis not present

## 2021-06-02 DIAGNOSIS — Z7401 Bed confinement status: Secondary | ICD-10-CM | POA: Diagnosis not present

## 2021-06-02 DIAGNOSIS — D689 Coagulation defect, unspecified: Secondary | ICD-10-CM | POA: Diagnosis not present

## 2021-06-02 DIAGNOSIS — N186 End stage renal disease: Secondary | ICD-10-CM | POA: Diagnosis not present

## 2021-06-02 DIAGNOSIS — I10 Essential (primary) hypertension: Secondary | ICD-10-CM | POA: Diagnosis not present

## 2021-06-02 DIAGNOSIS — N2581 Secondary hyperparathyroidism of renal origin: Secondary | ICD-10-CM | POA: Diagnosis not present

## 2021-06-02 DIAGNOSIS — R531 Weakness: Secondary | ICD-10-CM | POA: Diagnosis not present

## 2021-06-02 DIAGNOSIS — E8779 Other fluid overload: Secondary | ICD-10-CM | POA: Diagnosis not present

## 2021-06-03 DIAGNOSIS — I10 Essential (primary) hypertension: Secondary | ICD-10-CM | POA: Diagnosis not present

## 2021-06-03 NOTE — Telephone Encounter (Signed)
noted 

## 2021-06-04 ENCOUNTER — Other Ambulatory Visit (INDEPENDENT_AMBULATORY_CARE_PROVIDER_SITE_OTHER): Payer: Self-pay | Admitting: Primary Care

## 2021-06-04 DIAGNOSIS — N186 End stage renal disease: Secondary | ICD-10-CM | POA: Diagnosis not present

## 2021-06-04 DIAGNOSIS — D689 Coagulation defect, unspecified: Secondary | ICD-10-CM | POA: Diagnosis not present

## 2021-06-04 DIAGNOSIS — D631 Anemia in chronic kidney disease: Secondary | ICD-10-CM | POA: Diagnosis not present

## 2021-06-04 DIAGNOSIS — Z7401 Bed confinement status: Secondary | ICD-10-CM | POA: Diagnosis not present

## 2021-06-04 DIAGNOSIS — E8779 Other fluid overload: Secondary | ICD-10-CM | POA: Diagnosis not present

## 2021-06-04 DIAGNOSIS — N2581 Secondary hyperparathyroidism of renal origin: Secondary | ICD-10-CM | POA: Diagnosis not present

## 2021-06-04 DIAGNOSIS — Z743 Need for continuous supervision: Secondary | ICD-10-CM | POA: Diagnosis not present

## 2021-06-04 DIAGNOSIS — R531 Weakness: Secondary | ICD-10-CM | POA: Diagnosis not present

## 2021-06-04 DIAGNOSIS — D688 Other specified coagulation defects: Secondary | ICD-10-CM | POA: Diagnosis not present

## 2021-06-04 DIAGNOSIS — Z992 Dependence on renal dialysis: Secondary | ICD-10-CM

## 2021-06-04 DIAGNOSIS — I10 Essential (primary) hypertension: Secondary | ICD-10-CM | POA: Diagnosis not present

## 2021-06-04 DIAGNOSIS — T8249XA Other complication of vascular dialysis catheter, initial encounter: Secondary | ICD-10-CM | POA: Diagnosis not present

## 2021-06-04 NOTE — Telephone Encounter (Signed)
Patient is out of test strips, glucose blood (ONETOUCH VERIO) test strip , asking for short supply until refill comes in. Please call back.

## 2021-06-05 DIAGNOSIS — I10 Essential (primary) hypertension: Secondary | ICD-10-CM | POA: Diagnosis not present

## 2021-06-06 DIAGNOSIS — Z7401 Bed confinement status: Secondary | ICD-10-CM | POA: Diagnosis not present

## 2021-06-06 DIAGNOSIS — E8779 Other fluid overload: Secondary | ICD-10-CM | POA: Diagnosis not present

## 2021-06-06 DIAGNOSIS — Z992 Dependence on renal dialysis: Secondary | ICD-10-CM | POA: Diagnosis not present

## 2021-06-06 DIAGNOSIS — Z743 Need for continuous supervision: Secondary | ICD-10-CM | POA: Diagnosis not present

## 2021-06-06 DIAGNOSIS — D688 Other specified coagulation defects: Secondary | ICD-10-CM | POA: Diagnosis not present

## 2021-06-06 DIAGNOSIS — N186 End stage renal disease: Secondary | ICD-10-CM | POA: Diagnosis not present

## 2021-06-06 DIAGNOSIS — D689 Coagulation defect, unspecified: Secondary | ICD-10-CM | POA: Diagnosis not present

## 2021-06-06 DIAGNOSIS — T8249XA Other complication of vascular dialysis catheter, initial encounter: Secondary | ICD-10-CM | POA: Diagnosis not present

## 2021-06-06 DIAGNOSIS — I10 Essential (primary) hypertension: Secondary | ICD-10-CM | POA: Diagnosis not present

## 2021-06-06 DIAGNOSIS — R531 Weakness: Secondary | ICD-10-CM | POA: Diagnosis not present

## 2021-06-06 DIAGNOSIS — D631 Anemia in chronic kidney disease: Secondary | ICD-10-CM | POA: Diagnosis not present

## 2021-06-06 DIAGNOSIS — N2581 Secondary hyperparathyroidism of renal origin: Secondary | ICD-10-CM | POA: Diagnosis not present

## 2021-06-07 ENCOUNTER — Ambulatory Visit (INDEPENDENT_AMBULATORY_CARE_PROVIDER_SITE_OTHER): Payer: Medicare HMO

## 2021-06-07 DIAGNOSIS — I10 Essential (primary) hypertension: Secondary | ICD-10-CM | POA: Diagnosis not present

## 2021-06-07 DIAGNOSIS — Z0000 Encounter for general adult medical examination without abnormal findings: Secondary | ICD-10-CM

## 2021-06-07 DIAGNOSIS — Z743 Need for continuous supervision: Secondary | ICD-10-CM | POA: Diagnosis not present

## 2021-06-07 DIAGNOSIS — Z7401 Bed confinement status: Secondary | ICD-10-CM | POA: Diagnosis not present

## 2021-06-07 DIAGNOSIS — N186 End stage renal disease: Secondary | ICD-10-CM | POA: Diagnosis not present

## 2021-06-07 DIAGNOSIS — D688 Other specified coagulation defects: Secondary | ICD-10-CM | POA: Diagnosis not present

## 2021-06-07 DIAGNOSIS — R1084 Generalized abdominal pain: Secondary | ICD-10-CM | POA: Diagnosis not present

## 2021-06-07 DIAGNOSIS — T8249XA Other complication of vascular dialysis catheter, initial encounter: Secondary | ICD-10-CM | POA: Diagnosis not present

## 2021-06-07 DIAGNOSIS — N2581 Secondary hyperparathyroidism of renal origin: Secondary | ICD-10-CM | POA: Diagnosis not present

## 2021-06-07 DIAGNOSIS — D689 Coagulation defect, unspecified: Secondary | ICD-10-CM | POA: Diagnosis not present

## 2021-06-07 DIAGNOSIS — Z992 Dependence on renal dialysis: Secondary | ICD-10-CM | POA: Diagnosis not present

## 2021-06-07 DIAGNOSIS — E8779 Other fluid overload: Secondary | ICD-10-CM | POA: Diagnosis not present

## 2021-06-07 DIAGNOSIS — D631 Anemia in chronic kidney disease: Secondary | ICD-10-CM | POA: Diagnosis not present

## 2021-06-07 NOTE — Patient Instructions (Addendum)
Screening for Type 2 Diabetes A screening test for type 2 diabetes (type 2 diabetes mellitus) is a blood test to measure your blood sugar (glucose) level. This test is done to check for early signs of diabetes, before you develop symptoms.  Type 2 diabetes is a long-term (chronic) disease. In type 2 diabetes, one or both of these problems may be present: The pancreas does not make enough of a hormone called insulin. Cells in the body do not respond properly to insulin that the body makes (insulin resistance). Normally, insulin allows blood sugar (glucose) to enter cells in the body. The cells use glucose for energy. Insulin resistance or lack of insulin causes excess glucose to build up in the blood instead of going into cells. This results in high blood glucose levels (hyperglycemia), which can cause many complications. You may be screened for type 2 diabetes as part of your regular health care, especially if you have a high risk for diabetes. Screening can help to identify type 2 diabetes at its early stage (prediabetes). Identifying and treating prediabetes may delay or prevent the development of type 2 diabetes. Tell a health care provider about: All medicines you are taking, including vitamins, herbs, eye drops, creams, and over-the-counter medicines. Any bleeding problems you have. Any medical conditions you have. Whether you are pregnant or may be pregnant. Who should be screened for type 2 diabetes? Adults Adults age 29 and older. These adults should be screened once every three years. Adults who are any age, are overweight, and have one other risk factor. These adults should be screened once every three years. Adults who have normal blood glucose levels and two or more risk factors. These adults may be screened once every year (annually). Women who have had gestational diabetes in the past. These women should be screened once every three years. Pregnant women who have risk factors. These  women should be screened at their first prenatal visit and again between weeks 24 and 28 of pregnancy. Children and adolescents Children and adolescents should be screened for type 2 diabetes if they are overweight and have any of the following risk factors: A family history of type 2 diabetes. Being a member of a high-risk ethnic group. Signs of insulin resistance or conditions that are associated with insulin resistance. A mother who had gestational diabetes while pregnant. Screening should be done at least once every three years, starting at age 39 or at the onset of puberty, whichever comes first. Your health care provider or your child's health care provider may recommend having a screening more or less often. What are the risk factors for type 2 diabetes? The following are factors that may make you more likely to develop type 2 diabetes and can be modified: Not getting enough exercise. Having high blood pressure. Having low levels of good cholesterol (HDL-C) or high levels of blood fats (triglycerides). Having high blood glucose in a previous blood test. Being overweight or obese. The following are factors that may make you more likely to develop type 2 diabetes and can not be modified: Having a parent or sibling (first-degree relative) who has diabetes. Being of American-Indian, African-American, Hispanic/Latino, Asian, or [place] descent. Being older than age 21. Having a history of diabetes during pregnancy (gestational diabetes). Having certain diseases or conditions that may be caused by insulin resistance, including: Acanthosis nigricans. This is a condition that causes dark skin on the neck, armpits, and groin. Polycystic ovary syndrome (PCOS). Cardiovascular heart disease. What happens during  screening? During screening, your health care provider may ask questions about: Your health and your risk factors, including your activity level and any medical conditions that  you have. The health of your first-degree relatives. Past pregnancies, if this applies. Your health care provider will also do a physical exam, including a blood pressure measurement and blood tests. There are four blood tests that can be used to screen for type 2 diabetes. You may have one or more of the following: A fasting blood glucose (FBG) test. You will not be allowed to eat (you will fast) for 8 hours or more before a blood sample is taken. A random blood glucose test. This test checks your blood glucose at any time of the day regardless of when you ate. An oral glucose tolerance test (OGTT). This test measures your blood glucose at two times: After you have not eaten (have fasted) overnight. This is your baseline glucose level. Two hours after you drink a glucose-containing beverage. An A1C (hemoglobin A1C) blood test. This test provides information about blood glucose control over the previous 2-3 months. What do the results mean? Your test results are a measurement of how much glucose is in your blood. Normal blood glucose levels mean that you do not have diabetes or prediabetes. High blood glucose levels may mean that you have prediabetes or diabetes. Depending on the results, other tests may be needed to confirm the diagnosis. You may be diagnosed with type 2 diabetes if: Your FBG level is 126 mg/dL (7.0 mmol/L) or higher. Your random blood glucose level is 200 mg/dL (11.1 mmol/L) or higher. Your A1C level is 6.5% or higher. Your OGTT result is higher than 200 mg/dL (11.1 mmol/L). These blood tests may be repeated to confirm your diagnosis. Talk with your health care provider about what your results mean. Summary A screening test for type 2 diabetes (type 2 diabetes mellitus) is a blood test to measure your blood sugar (glucose) level. Know what your risk factors are for developing type 2 diabetes. If you are at risk, get screening tests as often as told by your health care  provider. Screening may help you identify type 2 diabetes at its early stage (prediabetes). Identifying and treating prediabetes may delay or prevent the development of type 2 diabetes. This information is not intended to replace advice given to you by your health care provider. Make sure you discuss any questions you have with your health care provider. Document Revised: 12/09/2020 Document Reviewed: 12/09/2020 Elsevier Patient Education  [place].  Diabetes Mellitus and [place] care is an important part of your health, especially when you have diabetes. Diabetes may cause you to have problems because of poor blood flow (circulation) to your feet and legs, which can cause your skin to: Become thinner and drier. Break more easily. Heal more slowly. Peel and crack. You may also have nerve damage (neuropathy) in your legs and feet, causing decreased feeling in them. This means that you may not notice minor injuries to your feet that could lead to more serious problems. Noticing and addressing any potential problems early is the best way to prevent future foot problems. How to care for your feet Foot hygiene  Wash your feet daily with warm water and mild soap. Do not use hot water. Then, pat your feet and the areas between your toes until they are completely dry. Do not soak your feet as this can dry your skin. Trim your toenails straight across. Do not  dig under them or around the cuticle. File the edges of your nails with an emery board or nail file. Apply a moisturizing lotion or petroleum jelly to the skin on your feet and to dry, brittle toenails. Use lotion that does not contain alcohol and is unscented. Do not apply lotion between your toes. Shoes and socks Wear clean socks or stockings every day. Make sure they are not too tight. Do not wear knee-high stockings since they may decrease blood flow to your legs. Wear shoes that fit properly and have enough cushioning. Always  look in your shoes before you put them on to be sure there are no objects inside. To break in new shoes, wear them for just a few hours a day. This prevents injuries on your feet. Wounds, scrapes, corns, and calluses  Check your feet daily for blisters, cuts, bruises, sores, and redness. If you cannot see the bottom of your feet, use a mirror or ask someone for help. Do not cut corns or calluses or try to remove them with medicine. If you find a minor scrape, cut, or break in the skin on your feet, keep it and the skin around it clean and dry. You may clean these areas with mild soap and water. Do not clean the area with peroxide, alcohol, or iodine. If you have a wound, scrape, corn, or callus on your foot, look at it several times a day to make sure it is healing and not infected. Check for: Redness, swelling, or pain. Fluid or blood. Warmth. Pus or a bad smell. General tips Do not cross your legs. This may decrease blood flow to your feet. Do not use heating pads or hot water bottles on your feet. They may burn your skin. If you have lost feeling in your feet or legs, you may not know this is happening until it is too late. Protect your feet from hot and cold by wearing shoes, such as at the beach or on hot pavement. Schedule a complete foot exam at least once a year (annually) or more often if you have foot problems. Report any cuts, sores, or bruises to your health care provider immediately. Where to find more information American Diabetes Association: www.diabetes.org Association of Diabetes Care & Education Specialists: www.diabeteseducator.org Contact a health care provider if: You have a medical condition that increases your risk of infection and you have any cuts, sores, or bruises on your feet. You have an injury that is not healing. You have redness on your legs or feet. You feel burning or tingling in your legs or feet. You have pain or cramps in your legs and feet. Your legs  or feet are numb. Your feet always feel cold. You have pain around any toenails. Get help right away if: You have a wound, scrape, corn, or callus on your foot and: You have pain, swelling, or redness that gets worse. You have fluid or blood coming from the wound, scrape, corn, or callus. Your wound, scrape, corn, or callus feels warm to the touch. You have pus or a bad smell coming from the wound, scrape, corn, or callus. You have a fever. You have a red line going up your leg. Summary Check your feet every day for blisters, cuts, bruises, sores, and redness. Apply a moisturizing lotion or petroleum jelly to the skin on your feet and to dry, brittle toenails. Wear shoes that fit properly and have enough cushioning. If you have foot problems, report any cuts, sores, or  bruises to your health care provider immediately. Schedule a complete foot exam at least once a year (annually) or more often if you have foot problems. This information is not intended to replace advice given to you by your health care provider. Make sure you discuss any questions you have with your health care provider. Document Revised: 04/04/2020 Document Reviewed: 04/04/2020 Elsevier Patient Education  [place] Maintenance, Male Adopting a healthy lifestyle and getting preventive care are important in promoting health and wellness. Ask your health care provider about: The right schedule for you to have regular tests and exams. Things you can do on your own to prevent diseases and keep yourself healthy. What should I know about diet, weight, and exercise? Eat a healthy diet  Eat a diet that includes plenty of vegetables, fruits, low-fat dairy products, and lean protein. Do not eat a lot of foods that are high in solid fats, added sugars, or sodium. Maintain a healthy weight Body mass index (BMI) is a measurement that can be used to identify possible weight problems. It estimates body fat based on  height and weight. Your health care provider can help determine your BMI and help you achieve or maintain a healthy weight. Get regular exercise Get regular exercise. This is one of the most important things you can do for your health. Most adults should: Exercise for at least 150 minutes each week. The exercise should increase your heart rate and make you sweat (moderate-intensity exercise). Do strengthening exercises at least twice a week. This is in addition to the moderate-intensity exercise. Spend less time sitting. Even light physical activity can be beneficial. Watch cholesterol and blood lipids Have your blood tested for lipids and cholesterol at 42 years of age, then have this test every 5 years. You may need to have your cholesterol levels checked more often if: Your lipid or cholesterol levels are high. You are older than 41 years of age. You are at high risk for heart disease. What should I know about cancer screening? Many types of cancers can be detected early and may often be prevented. Depending on your health history and family history, you may need to have cancer screening at various ages. This may include screening for: Colorectal cancer. Prostate cancer. Skin cancer. Lung cancer. What should I know about heart disease, diabetes, and high blood pressure? Blood pressure and heart disease High blood pressure causes heart disease and increases the risk of stroke. This is more likely to develop in people who have high blood pressure readings, are of African descent, or are overweight. Talk with your health care provider about your target blood pressure readings. Have your blood pressure checked: Every 3-5 years if you are 42-29 years of age. Every year if you are 65 years old or older. If you are between the ages of 33 and 21 and are a current or former smoker, ask your health care provider if you should have a one-time screening for abdominal aortic aneurysm  (AAA). Diabetes Have regular diabetes screenings. This checks your fasting blood sugar level. Have the screening done: Once every three years after age 64 if you are at a normal weight and have a low risk for diabetes. More often and at a younger age if you are overweight or have a high risk for diabetes. What should I know about preventing infection? Hepatitis B If you have a higher risk for hepatitis B, you should be screened for this virus. Talk with your health  care provider to find out if you are at risk for hepatitis B infection. Hepatitis C Blood testing is recommended for: Everyone born from [birth year] through 1967. Anyone with known risk factors for hepatitis C. Sexually transmitted infections (STIs) You should be screened each year for STIs, including gonorrhea and chlamydia, if: You are sexually active and are younger than 42 years of age. You are older than 42 years of age and your health care provider tells you that you are at risk for this type of infection. Your sexual activity has changed since you were last screened, and you are at increased risk for chlamydia or gonorrhea. Ask your health care provider if you are at risk. Ask your health care provider about whether you are at high risk for HIV. Your health care provider may recommend a prescription medicine to help prevent HIV infection. If you choose to take medicine to prevent HIV, you should first get tested for HIV. You should then be tested every 3 months for as long as you are taking the medicine. Follow these instructions at home: Lifestyle Do not use any products that contain nicotine or tobacco, such as cigarettes, e-cigarettes, and chewing tobacco. If you need help quitting, ask your health care provider. Do not use street drugs. Do not share needles. Ask your health care provider for help if you need support or information about quitting drugs. Alcohol use Do not drink alcohol if your health care provider tells you not  to drink. If you drink alcohol: Limit how much you have to 0-2 drinks a day. Be aware of how much alcohol is in your drink. In the [place] one drink equals one 12 oz bottle of beer (355 mL), one 5 oz glass of wine (148 mL), or one 1 oz glass of hard liquor (44 mL). General instructions Schedule regular health, dental, and eye exams. Stay current with your vaccines. Tell your health care provider if: You often feel depressed. You have ever been abused or do not feel safe at home. Summary Adopting a healthy lifestyle and getting preventive care are important in promoting health and wellness. Follow your health care provider's instructions about healthy diet, exercising, and getting tested or screened for diseases. Follow your health care provider's instructions on monitoring your cholesterol and blood pressure. This information is not intended to replace advice given to you by your health care provider. Make sure you discuss any questions you have with your health care provider. Document Revised: 11/22/2020 Document Reviewed: 09/07/2018 Elsevier Patient Education  2022 [place] for Type 2 Diabetes A screening test for type 2 diabetes (type 2 diabetes mellitus) is a blood test to measure your blood sugar (glucose) level. This test is done to check for early signs of diabetes, before you develop symptoms.  Type 2 diabetes is a long-term (chronic) disease. In type 2 diabetes, one or both of these problems may be present: The pancreas does not make enough of a hormone called insulin. Cells in the body do not respond properly to insulin that the body makes (insulin resistance). Normally, insulin allows blood sugar (glucose) to enter cells in the body. The cells use glucose for energy. Insulin resistance or lack of insulin causes excess glucose to build up in the blood instead of going into cells. This results in high blood glucose levels (hyperglycemia), which can cause many  complications. You may be screened for type 2 diabetes as part of your regular health care, especially if you have a high  risk for diabetes. Screening can help to identify type 2 diabetes at its early stage (prediabetes). Identifying and treating prediabetes may delay or prevent the development of type 2 diabetes. Tell a health care provider about: All medicines you are taking, including vitamins, herbs, eye drops, creams, and over-the-counter medicines. Any bleeding problems you have. Any medical conditions you have. Whether you are pregnant or may be pregnant. Who should be screened for type 2 diabetes? Adults Adults age 32 and older. These adults should be screened once every three years. Adults who are any age, are overweight, and have one other risk factor. These adults should be screened once every three years. Adults who have normal blood glucose levels and two or more risk factors. These adults may be screened once every year (annually). Women who have had gestational diabetes in the past. These women should be screened once every three years. Pregnant women who have risk factors. These women should be screened at their first prenatal visit and again between weeks 24 and 28 of pregnancy. Children and adolescents Children and adolescents should be screened for type 2 diabetes if they are overweight and have any of the following risk factors: A family history of type 2 diabetes. Being a member of a high-risk ethnic group. Signs of insulin resistance or conditions that are associated with insulin resistance. A mother who had gestational diabetes while pregnant. Screening should be done at least once every three years, starting at age 33 or at the onset of puberty, whichever comes first. Your health care provider or your child's health care provider may recommend having a screening more or less often. What are the risk factors for type 2 diabetes? The following are factors that may make  you more likely to develop type 2 diabetes and can be modified: Not getting enough exercise. Having high blood pressure. Having low levels of good cholesterol (HDL-C) or high levels of blood fats (triglycerides). Having high blood glucose in a previous blood test. Being overweight or obese. The following are factors that may make you more likely to develop type 2 diabetes and can not be modified: Having a parent or sibling (first-degree relative) who has diabetes. Being of American-Indian, African-American, Hispanic/Latino, Asian, or [place] descent. Being older than age 10. Having a history of diabetes during pregnancy (gestational diabetes). Having certain diseases or conditions that may be caused by insulin resistance, including: Acanthosis nigricans. This is a condition that causes dark skin on the neck, armpits, and groin. Polycystic ovary syndrome (PCOS). Cardiovascular heart disease. What happens during screening? During screening, your health care provider may ask questions about: Your health and your risk factors, including your activity level and any medical conditions that you have. The health of your first-degree relatives. Past pregnancies, if this applies. Your health care provider will also do a physical exam, including a blood pressure measurement and blood tests. There are four blood tests that can be used to screen for type 2 diabetes. You may have one or more of the following: A fasting blood glucose (FBG) test. You will not be allowed to eat (you will fast) for 8 hours or more before a blood sample is taken. A random blood glucose test. This test checks your blood glucose at any time of the day regardless of when you ate. An oral glucose tolerance test (OGTT). This test measures your blood glucose at two times: After you have not eaten (have fasted) overnight. This is your baseline glucose level. Two hours after you  drink a glucose-containing beverage. An A1C  (hemoglobin A1C) blood test. This test provides information about blood glucose control over the previous 2-3 months. What do the results mean? Your test results are a measurement of how much glucose is in your blood. Normal blood glucose levels mean that you do not have diabetes or prediabetes. High blood glucose levels may mean that you have prediabetes or diabetes. Depending on the results, other tests may be needed to confirm the diagnosis. You may be diagnosed with type 2 diabetes if: Your FBG level is 126 mg/dL (7.0 mmol/L) or higher. Your random blood glucose level is 200 mg/dL (11.1 mmol/L) or higher. Your A1C level is 6.5% or higher. Your OGTT result is higher than 200 mg/dL (11.1 mmol/L). These blood tests may be repeated to confirm your diagnosis. Talk with your health care provider about what your results mean. Summary A screening test for type 2 diabetes (type 2 diabetes mellitus) is a blood test to measure your blood sugar (glucose) level. Know what your risk factors are for developing type 2 diabetes. If you are at risk, get screening tests as often as told by your health care provider. Screening may help you identify type 2 diabetes at its early stage (prediabetes). Identifying and treating prediabetes may delay or prevent the development of type 2 diabetes. This information is not intended to replace advice given to you by your health care provider. Make sure you discuss any questions you have with your health care provider. Document Revised: 12/09/2020 Document Reviewed: 12/09/2020 Elsevier Patient Education  [place].  Diabetes Mellitus and [place] care is an important part of your health, especially when you have diabetes. Diabetes may cause you to have problems because of poor blood flow (circulation) to your feet and legs, which can cause your skin to: Become thinner and drier. Break more easily. Heal more slowly. Peel and crack. You may also have nerve  damage (neuropathy) in your legs and feet, causing decreased feeling in them. This means that you may not notice minor injuries to your feet that could lead to more serious problems. Noticing and addressing any potential problems early is the best way to prevent future foot problems. How to care for your feet Foot hygiene  Wash your feet daily with warm water and mild soap. Do not use hot water. Then, pat your feet and the areas between your toes until they are completely dry. Do not soak your feet as this can dry your skin. Trim your toenails straight across. Do not dig under them or around the cuticle. File the edges of your nails with an emery board or nail file. Apply a moisturizing lotion or petroleum jelly to the skin on your feet and to dry, brittle toenails. Use lotion that does not contain alcohol and is unscented. Do not apply lotion between your toes. Shoes and socks Wear clean socks or stockings every day. Make sure they are not too tight. Do not wear knee-high stockings since they may decrease blood flow to your legs. Wear shoes that fit properly and have enough cushioning. Always look in your shoes before you put them on to be sure there are no objects inside. To break in new shoes, wear them for just a few hours a day. This prevents injuries on your feet. Wounds, scrapes, corns, and calluses  Check your feet daily for blisters, cuts, bruises, sores, and redness. If you cannot see the bottom of your feet, use a mirror or  ask someone for help. Do not cut corns or calluses or try to remove them with medicine. If you find a minor scrape, cut, or break in the skin on your feet, keep it and the skin around it clean and dry. You may clean these areas with mild soap and water. Do not clean the area with peroxide, alcohol, or iodine. If you have a wound, scrape, corn, or callus on your foot, look at it several times a day to make sure it is healing and not infected. Check for: Redness,  swelling, or pain. Fluid or blood. Warmth. Pus or a bad smell. General tips Do not cross your legs. This may decrease blood flow to your feet. Do not use heating pads or hot water bottles on your feet. They may burn your skin. If you have lost feeling in your feet or legs, you may not know this is happening until it is too late. Protect your feet from hot and cold by wearing shoes, such as at the beach or on hot pavement. Schedule a complete foot exam at least once a year (annually) or more often if you have foot problems. Report any cuts, sores, or bruises to your health care provider immediately. Where to find more information American Diabetes Association: www.diabetes.org Association of Diabetes Care & Education Specialists: www.diabeteseducator.org Contact a health care provider if: You have a medical condition that increases your risk of infection and you have any cuts, sores, or bruises on your feet. You have an injury that is not healing. You have redness on your legs or feet. You feel burning or tingling in your legs or feet. You have pain or cramps in your legs and feet. Your legs or feet are numb. Your feet always feel cold. You have pain around any toenails. Get help right away if: You have a wound, scrape, corn, or callus on your foot and: You have pain, swelling, or redness that gets worse. You have fluid or blood coming from the wound, scrape, corn, or callus. Your wound, scrape, corn, or callus feels warm to the touch. You have pus or a bad smell coming from the wound, scrape, corn, or callus. You have a fever. You have a red line going up your leg. Summary Check your feet every day for blisters, cuts, bruises, sores, and redness. Apply a moisturizing lotion or petroleum jelly to the skin on your feet and to dry, brittle toenails. Wear shoes that fit properly and have enough cushioning. If you have foot problems, report any cuts, sores, or bruises to your health care  provider immediately. Schedule a complete foot exam at least once a year (annually) or more often if you have foot problems. This information is not intended to replace advice given to you by your health care provider. Make sure you discuss any questions you have with your health care provider. Document Revised: 04/04/2020 Document Reviewed: 04/04/2020 Elsevier Patient Education  [place] Maintenance, Male Adopting a healthy lifestyle and getting preventive care are important in promoting health and wellness. Ask your health care provider about: The right schedule for you to have regular tests and exams. Things you can do on your own to prevent diseases and keep yourself healthy. What should I know about diet, weight, and exercise? Eat a healthy diet  Eat a diet that includes plenty of vegetables, fruits, low-fat dairy products, and lean protein. Do not eat a lot of foods that are high in solid fats, added sugars, or  sodium. Maintain a healthy weight Body mass index (BMI) is a measurement that can be used to identify possible weight problems. It estimates body fat based on height and weight. Your health care provider can help determine your BMI and help you achieve or maintain a healthy weight. Get regular exercise Get regular exercise. This is one of the most important things you can do for your health. Most adults should: Exercise for at least 150 minutes each week. The exercise should increase your heart rate and make you sweat (moderate-intensity exercise). Do strengthening exercises at least twice a week. This is in addition to the moderate-intensity exercise. Spend less time sitting. Even light physical activity can be beneficial. Watch cholesterol and blood lipids Have your blood tested for lipids and cholesterol at 42 years of age, then have this test every 5 years. You may need to have your cholesterol levels checked more often if: Your lipid or cholesterol levels  are high. You are older than 42 years of age. You are at high risk for heart disease. What should I know about cancer screening? Many types of cancers can be detected early and may often be prevented. Depending on your health history and family history, you may need to have cancer screening at various ages. This may include screening for: Colorectal cancer. Prostate cancer. Skin cancer. Lung cancer. What should I know about heart disease, diabetes, and high blood pressure? Blood pressure and heart disease High blood pressure causes heart disease and increases the risk of stroke. This is more likely to develop in people who have high blood pressure readings, are of African descent, or are overweight. Talk with your health care provider about your target blood pressure readings. Have your blood pressure checked: Every 3-5 years if you are 3-70 years of age. Every year if you are 29 years old or older. If you are between the ages of 12 and 4 and are a current or former smoker, ask your health care provider if you should have a one-time screening for abdominal aortic aneurysm (AAA). Diabetes Have regular diabetes screenings. This checks your fasting blood sugar level. Have the screening done: Once every three years after age 27 if you are at a normal weight and have a low risk for diabetes. More often and at a younger age if you are overweight or have a high risk for diabetes. What should I know about preventing infection? Hepatitis B If you have a higher risk for hepatitis B, you should be screened for this virus. Talk with your health care provider to find out if you are at risk for hepatitis B infection. Hepatitis C Blood testing is recommended for: Everyone born from [birth year] through 1965. Anyone with known risk factors for hepatitis C. Sexually transmitted infections (STIs) You should be screened each year for STIs, including gonorrhea and chlamydia, if: You are sexually active and are  younger than 42 years of age. You are older than 42 years of age and your health care provider tells you that you are at risk for this type of infection. Your sexual activity has changed since you were last screened, and you are at increased risk for chlamydia or gonorrhea. Ask your health care provider if you are at risk. Ask your health care provider about whether you are at high risk for HIV. Your health care provider may recommend a prescription medicine to help prevent HIV infection. If you choose to take medicine to prevent HIV, you should first get tested for HIV.  You should then be tested every 3 months for as long as you are taking the medicine. Follow these instructions at home: Lifestyle Do not use any products that contain nicotine or tobacco, such as cigarettes, e-cigarettes, and chewing tobacco. If you need help quitting, ask your health care provider. Do not use street drugs. Do not share needles. Ask your health care provider for help if you need support or information about quitting drugs. Alcohol use Do not drink alcohol if your health care provider tells you not to drink. If you drink alcohol: Limit how much you have to 0-2 drinks a day. Be aware of how much alcohol is in your drink. In the [place] one drink equals one 12 oz bottle of beer (355 mL), one 5 oz glass of wine (148 mL), or one 1 oz glass of hard liquor (44 mL). General instructions Schedule regular health, dental, and eye exams. Stay current with your vaccines. Tell your health care provider if: You often feel depressed. You have ever been abused or do not feel safe at home. Summary Adopting a healthy lifestyle and getting preventive care are important in promoting health and wellness. Follow your health care provider's instructions about healthy diet, exercising, and getting tested or screened for diseases. Follow your health care provider's instructions on monitoring your cholesterol and blood pressure. This  information is not intended to replace advice given to you by your health care provider. Make sure you discuss any questions you have with your health care provider. Document Revised: 11/22/2020 Document Reviewed: 09/07/2018 Elsevier Patient Education  2022 [employer].

## 2021-06-07 NOTE — Progress Notes (Signed)
I connected with  [name] on 06/07/21 by a telephone enabled telemedicine application and verified that I am speaking with the correct person using two identifiers.   I discussed the limitations of evaluation and management by telemedicine. The patient expressed understanding and agreed to proceed.   Subjective:   [name] is a 40 y.o. male who presents for an Initial Medicare Annual Wellness Visit.  Review of Systems    Defer to PCP.        Objective:    There were no vitals filed for this visit. There is no height or weight on file to calculate BMI.  Advanced Directives 05/20/2021 05/19/2021 12/14/2020 10/29/2020 10/28/2020 10/15/2020 10/15/2020  Does Patient Have a Medical Advance Directive? No No No No No No No  Would patient like information on creating a medical advance directive? No - Patient declined No - Patient declined - Yes (ED - Information included in AVS) No - Patient declined No - Patient declined No - Patient declined    Current Medications (verified) Outpatient Encounter Medications as of 06/07/2021  Medication Sig   Blood Glucose Monitoring Suppl (ONETOUCH VERIO FLEX SYSTEM) w/Device KIT 1 Bag by Does not apply route 3 (three) times daily before meals.   calcitRIOL (ROCALTROL) 0.5 MCG capsule Take 3 capsules (1.5 mcg total) by mouth every Monday, Wednesday, and Friday with hemodialysis.   cinacalcet (SENSIPAR) 60 MG tablet Take 120 mg by mouth daily after supper.   gabapentin (NEURONTIN) 100 MG capsule Take 1 capsule (100 mg total) by mouth 3 (three) times daily.   insulin aspart (NOVOLOG) 100 UNIT/ML injection Inject 10 Units into the skin 3 (three) times daily before meals. For blood sugars 0-150 give 0 units of insulin, 151-200 give 2 units of insulin, 201-250 give 4 units, 251-300 give 6 units, 301-350 give 8 units, 351-400 give 10 units,> 400 give 12 units and call M.D. Discussed hypoglycemia protocol.   Lancets (ONETOUCH DELICA PLUS [id number]) MISC Apply  topically.   metroNIDAZOLE (FLAGYL) 500 MG tablet Take 1 tablet (500 mg total) by mouth every 12 (twelve) hours.   multivitamin (RENA-VIT) TABS tablet Take 1 tablet by mouth at bedtime.   nutrition supplement, JUVEN, (JUVEN) PACK Take 1 packet by mouth 2 (two) times daily between meals.   Nutritional Supplements (,FEEDING SUPPLEMENT, PROSOURCE PLUS) liquid Take 30 mLs by mouth 3 (three) times daily between meals.   Nutritional Supplements (FEEDING SUPPLEMENT, NEPRO CARB STEADY,) LIQD Take 237 mLs by mouth 2 (two) times daily between meals.   ONETOUCH VERIO test strip USE 1 STRIP TO CHECK GLUCOSE THREE TIMES DAILY   sevelamer carbonate (RENVELA) 800 MG tablet Take 2,400-4,000 mg by mouth See admin instructions. Take 4,000 mg (5 tablets) by mouth with meals and 2,400 mg (3 tablets) with snacks   No facility-administered encounter medications on file as of 06/07/2021.    Allergies (verified) Patient has no known allergies.   History: Past Medical History:  Diagnosis Date   Anemia    Diabetes mellitus without complication ([place])    Diabetic gastroparesis ([place])    Dialysis patient [facility])    Hypertension    Renal disorder    Dialysis   Sepsis [facility])    Past Surgical History:  Procedure Laterality Date   AMPUTATION Right 02/02/2018   Procedure: RIGHT FIFTH TOE AND METATARSAL AMPUTATION. Filetted toe flap metatarsal resection. Debridement Plantar Foot wound;  Surgeon: Evelina Bucy, DPM;  Location: [place];  Service: Podiatry;  Laterality: Right;  AMPUTATION Left 08/20/2018   Procedure: FIFTH METATARSAL BONE BIOPSY;  Surgeon: Evelina Bucy, DPM;  Location: [place];  Service: Podiatry;  Laterality: Left;   AMPUTATION Left 10/28/2018   Procedure: LEFT GREAT TOE AMPUTATION;  Surgeon: Evelina Bucy, DPM;  Location: [place];  Service: Podiatry;  Laterality: Left;   AMPUTATION Left 09/16/2020   Procedure: AMPUTATION BELOW KNEE;  Surgeon: Rosetta Posner, MD;  Location: [place];  Service: Vascular;   Laterality: Left;   AMPUTATION Left 10/15/2020   Procedure: AMPUTATION ABOVE KNEE;  Surgeon: Elam Dutch, MD;  Location: [place];  Service: Vascular;  Laterality: Left;   APPLICATION OF WOUND VAC  02/02/2018   Procedure: APPLICATION OF WOUND VAC  Right Foot;  Surgeon: Evelina Bucy, DPM;  Location: [place];  Service: Podiatry;;   APPLICATION OF WOUND VAC Left 10/28/2018   Procedure: APPLICATION OF WOUND VAC LEFT TOE;  Surgeon: Evelina Bucy, DPM;  Location: [place];  Service: Podiatry;  Laterality: Left;   APPLICATION OF WOUND VAC Left 11/01/2018   Procedure: APPLICATION OF WOUND VAC;  Surgeon: Evelina Bucy, DPM;  Location: [place];  Service: Podiatry;  Laterality: Left;   AV FISTULA PLACEMENT     left arm.   AV FISTULA PLACEMENT Right 12/22/2016   Procedure: INSERTION OF ARTERIOVENOUS (AV) GORE-TEX GRAFT ARM;  Surgeon: Elam Dutch, MD;  Location: [place];  Service: Vascular;  Laterality: Right;   AV FISTULA PLACEMENT Left 05/26/2018   Procedure: INSERTION OF  ARTERIOVENOUS (AV) GORE-TEX GRAFT LEFT ARM;  Surgeon: Serafina Mitchell, MD;  Location: [place];  Service: Vascular;  Laterality: Left;   EYE SURGERY     I & D EXTREMITY Right 10/31/2017   Procedure: IRRIGATION AND DEBRIDEMENT RIGHT FOOT;  Surgeon: Evelina Bucy, DPM;  Location: [place];  Service: Podiatry;  Laterality: Right;   I & D EXTREMITY Left 08/20/2018   Procedure: IRRIGATION AND DEBRIDEMENT EXTREMITY WITH SECONDARY WOUND CLOSUREAND APPLICATION OF WOUND VAC LEFT FOOT;  Surgeon: Evelina Bucy, DPM;  Location: [place];  Service: Podiatry;  Laterality: Left;   I & D EXTREMITY Left 10/20/2018   Procedure: IRRIGATION AND DEBRIDEMENT LEFT FOOT  DEBRIDEMENT LATERAL FOOT WOUND;  Surgeon: Evelina Bucy, DPM;  Location: [place];  Service: Podiatry;  Laterality: Left;   I & D EXTREMITY Left 10/28/2018   Procedure: IRRIGATION AND DEBRIDEMENT LEFT TOE;  Surgeon: Evelina Bucy, DPM;  Location: [place];  Service: Podiatry;  Laterality: Left;    I & D EXTREMITY Left 09/14/2020   Procedure: IRRIGATION AND DEBRIDEMENT WRIST;  Surgeon: Leanora Cover, MD;  Location: [place];  Service: Orthopedics;  Laterality: Left;   INSERTION OF DIALYSIS CATHETER     Right subclavian   IR AV DIALY SHUNT INTRO NEEDLE/INTRACATH INITIAL W/PTA/IMG RIGHT Right 02/05/2018   IR FLUORO GUIDE CV LINE RIGHT  01/31/2020   IR THROMBECTOMY AV FISTULA W/THROMBOLYSIS/PTA INC/SHUNT/IMG LEFT Left 08/24/2018   IR THROMBECTOMY AV FISTULA W/THROMBOLYSIS/PTA INC/SHUNT/IMG LEFT Left 01/06/2019   IR US GUIDE VASC ACCESS LEFT  08/24/2018   IR US GUIDE VASC ACCESS RIGHT  02/05/2018   IR US GUIDE VASC ACCESS RIGHT  01/31/2020   IRRIGATION AND DEBRIDEMENT FOOT Right 10/23/2018   Procedure: Irrigation and Debridement to tendon, Left Foot;  Surgeon: Evelina Bucy, DPM;  Location: [place];  Service: Podiatry;  Laterality: Right;   IRRIGATION AND DEBRIDEMENT FOOT Left 11/01/2018   Procedure: IRRIGATION AND DEBRIDEMENT PARTIAL WOUND CLOSURE  LOCAL TISSUE TRANSFER AND FLAP ROTATION, LEFT FOOT;  Surgeon: Evelina Bucy, DPM;  Location: [place];  Service: Podiatry;  Laterality: Left;   TRANSMETATARSAL AMPUTATION N/A 08/18/2018   Procedure: IRRIGATION AND DEBRIDEMENT OF LEFT 5TH TOE AND TRANSMETATARSAL, WITH PARTICAL LEFT 5TH TOE AND METATARSAL AMPUTATION, BONE BIOPSY, WOUND VAC APPLICATION.;  Surgeon: Evelina Bucy, DPM;  Location: [place];  Service: Podiatry;  Laterality: N/A;   UPPER EXTREMITY VENOGRAPHY N/A 11/16/2016   Procedure: Upper Extremity Venography - Right Central;  Surgeon: Elam Dutch, MD;  Location: [place] [facility];  Service: Cardiovascular;  Laterality: N/A;   UPPER EXTREMITY VENOGRAPHY N/A 05/25/2018   Procedure: UPPER EXTREMITY VENOGRAPHY - Bilateral;  Surgeon: Marty Heck, MD;  Location: [place] [facility];  Service: Cardiovascular;  Laterality: N/A;   Family History  Problem Relation Age of Onset   Diabetes Mellitus II Other    Diabetes Father    Renal Disease  Father        ESRD      Tobacco Counseling Never Smoker.   Clinical Intake: Patient feels safe in the home. Patient was seen recently in ED due to cellulitis but follows [facility].         Diabetes: Yes CBG done?: No Did pt. bring in CBG monitor from home?: No     Diabetic? Yes. Insulin Dependant.   Interpreter Needed?: No      Activities of Daily Living In your present state of health, do you have any difficulty performing the following activities: 06/07/2021 05/20/2021  Hearing? N N  Vision? N N  Difficulty concentrating or making decisions? N N  Walking or climbing stairs? Y Y  Dressing or bathing? N N  Doing errands, shopping? N N  Some recent data might be hidden    Patient Care Team: Kerin Perna, NP as PCP - General (Internal Medicine) [name], [name], MD as Consulting Physician (Nephrology) Center, [facility] as Referring Physician (Nephrology) Cherre Robins, MD as Consulting Physician (Vascular Surgery)  Indicate any recent Medical Services you may have received from other than [facility] providers in the past year (date may be approximate).     Assessment:   This is a routine wellness examination for [name].  Hearing/Vision screen Needs eye doctor referral.   Dietary issues and exercise activities discussed:     Goals Addressed   None   Depression Screen PHQ 2/9 Scores 06/07/2021 02/13/2021 02/13/2020 05/25/2019  PHQ - 2 Score 0 0 0 0  PHQ- 9 Score - 0 - -    Fall Risk Fall Risk  06/07/2021 02/13/2020 05/25/2019  Falls in the past year? 0 1 0  Number falls in past yr: 0 0 -  Injury with Fall? 0 0 -  Risk for fall due to : No Fall Risks Impaired balance/gait;Impaired mobility -  Follow up Falls evaluation completed - -    FALL RISK PREVENTION PERTAINING TO THE HOME:  Any stairs in or around the home? No  If so, are there any without handrails? No  Home free of loose throw rugs in walkways, pet beds,  electrical cords, etc? No  Adequate lighting in your home to reduce risk of falls? Yes   ASSISTIVE DEVICES UTILIZED TO PREVENT FALLS:  Life alert? No  Use of a cane, walker or w/c? No  Grab bars in the bathroom? No  Shower chair or bench in shower? Yes  Elevated toilet seat or a handicapped toilet?  No   Cognitive Function:    Patient was not feeling well on phone. Did ask for referral for eye doctor.     Immunizations Immunization History  Administered Date(s) Administered   Hepatitis B, adult 06/30/2012, 09/19/2012, 10/20/2012, 11/21/2012, 12/15/2012   Influenza,inj,quad, With Preservative 07/01/2018    TDAP status: Due, Education has been provided regarding the importance of this vaccine. Advised may receive this vaccine at local pharmacy or Health Dept. Aware to provide a copy of the vaccination record if obtained from local pharmacy or Health Dept. Verbalized acceptance and understanding.  Flu Vaccine status: Due, Education has been provided regarding the importance of this vaccine. Advised may receive this vaccine at local pharmacy or Health Dept. Aware to provide a copy of the vaccination record if obtained from local pharmacy or Health Dept. Verbalized acceptance and understanding.  Pneumococcal vaccine status: Due, Education has been provided regarding the importance of this vaccine. Advised may receive this vaccine at local pharmacy or Health Dept. Aware to provide a copy of the vaccination record if obtained from local pharmacy or Health Dept. Verbalized acceptance and understanding.  Covid-19 vaccine status: Information provided on how to obtain vaccines.   Qualifies for Shingles Vaccine? No   Zostavax completed No   Shingrix Completed?: No.    Education has been provided regarding the importance of this vaccine. Patient has been advised to call insurance company to determine out of pocket expense if they have not yet received this vaccine. Advised may also receive vaccine  at local pharmacy or Health Dept. Verbalized acceptance and understanding.  Screening Tests Health Maintenance  Topic Date Due   COVID-19 Vaccine (1) Never done   PNEUMOCOCCAL POLYSACCHARIDE VACCINE AGE 408-64 HIGH RISK  Never done   Pneumococcal Vaccine 63-60 Years old (1 - PCV) Never done   FOOT EXAM  Never done   OPHTHALMOLOGY EXAM  Never done   URINE MICROALBUMIN  Never done   Hepatitis C Screening  Never done   TETANUS/TDAP  Never done   INFLUENZA VACCINE  04/28/2021   HEMOGLOBIN A1C  11/19/2021   HIV Screening  Completed   HPV VACCINES  Aged Out    Health Maintenance  Health Maintenance Due  Topic Date Due   COVID-19 Vaccine (1) Never done   PNEUMOCOCCAL POLYSACCHARIDE VACCINE AGE 408-64 HIGH RISK  Never done   Pneumococcal Vaccine 59-60 Years old (1 - PCV) Never done   FOOT EXAM  Never done   OPHTHALMOLOGY EXAM  Never done   URINE MICROALBUMIN  Never done   Hepatitis C Screening  Never done   TETANUS/TDAP  Never done   INFLUENZA VACCINE  04/28/2021     Lung Cancer Screening: (Low Dose CT Chest recommended if Age 29-80 years, 30 pack-year currently smoking OR have quit w/in 15years.) does not qualify.     Additional Screening:  Hepatitis C Screening: Defer to PCP   Vision Screening: Recommended annual ophthalmology exams for early detection of glaucoma and other disorders of the eye. Is the patient up to date with their annual eye exam?  No  Who is the provider or what is the name of the office in which the patient attends annual eye exams? Has no eye doctor at this time. Will alert PCP for referral.  If pt is not established with a provider, would they like to be referred to a provider to establish care? Yes .   Dental Screening: Recommended annual dental exams for proper oral hygiene. Patient has not been  to dentist in years. Alerted PCP for referral.   Community Resource Referral / Chronic Care Management: CRR required this visit?  No   CCM required this visit?   No      Plan:     I have personally reviewed and noted the following in the patient's chart:   Medical and social history Use of alcohol, tobacco or illicit drugs  Current medications and supplements including opioid prescriptions. Patient is not currently taking opioid prescriptions. Functional ability and status Nutritional status Physical activity Advanced directives List of other physicians Hospitalizations, surgeries, and ER visits in previous 12 months Vitals Screenings to include cognitive, depression, and falls Referrals and appointments  In addition, I have reviewed and discussed with patient certain preventive protocols, quality metrics, and best practice recommendations. A written personalized care plan for preventive services as well as general preventive health recommendations were provided to patient.     Stephan Minister, [place]   06/07/2021

## 2021-06-08 DIAGNOSIS — S91302A Unspecified open wound, left foot, initial encounter: Secondary | ICD-10-CM | POA: Diagnosis not present

## 2021-06-08 DIAGNOSIS — Z4781 Encounter for orthopedic aftercare following surgical amputation: Secondary | ICD-10-CM | POA: Diagnosis not present

## 2021-06-08 DIAGNOSIS — I10 Essential (primary) hypertension: Secondary | ICD-10-CM | POA: Diagnosis not present

## 2021-06-09 DIAGNOSIS — R5383 Other fatigue: Secondary | ICD-10-CM | POA: Diagnosis not present

## 2021-06-09 DIAGNOSIS — T8249XA Other complication of vascular dialysis catheter, initial encounter: Secondary | ICD-10-CM | POA: Diagnosis not present

## 2021-06-09 DIAGNOSIS — N2581 Secondary hyperparathyroidism of renal origin: Secondary | ICD-10-CM | POA: Diagnosis not present

## 2021-06-09 DIAGNOSIS — I10 Essential (primary) hypertension: Secondary | ICD-10-CM | POA: Diagnosis not present

## 2021-06-09 DIAGNOSIS — D631 Anemia in chronic kidney disease: Secondary | ICD-10-CM | POA: Diagnosis not present

## 2021-06-09 DIAGNOSIS — Z7401 Bed confinement status: Secondary | ICD-10-CM | POA: Diagnosis not present

## 2021-06-09 DIAGNOSIS — D688 Other specified coagulation defects: Secondary | ICD-10-CM | POA: Diagnosis not present

## 2021-06-09 DIAGNOSIS — N186 End stage renal disease: Secondary | ICD-10-CM | POA: Diagnosis not present

## 2021-06-09 DIAGNOSIS — R531 Weakness: Secondary | ICD-10-CM | POA: Diagnosis not present

## 2021-06-09 DIAGNOSIS — Z992 Dependence on renal dialysis: Secondary | ICD-10-CM | POA: Diagnosis not present

## 2021-06-10 DIAGNOSIS — I10 Essential (primary) hypertension: Secondary | ICD-10-CM | POA: Diagnosis not present

## 2021-06-11 DIAGNOSIS — N186 End stage renal disease: Secondary | ICD-10-CM | POA: Diagnosis not present

## 2021-06-11 DIAGNOSIS — R5381 Other malaise: Secondary | ICD-10-CM | POA: Diagnosis not present

## 2021-06-11 DIAGNOSIS — Z743 Need for continuous supervision: Secondary | ICD-10-CM | POA: Diagnosis not present

## 2021-06-11 DIAGNOSIS — D631 Anemia in chronic kidney disease: Secondary | ICD-10-CM | POA: Diagnosis not present

## 2021-06-11 DIAGNOSIS — R5383 Other fatigue: Secondary | ICD-10-CM | POA: Diagnosis not present

## 2021-06-11 DIAGNOSIS — Z7401 Bed confinement status: Secondary | ICD-10-CM | POA: Diagnosis not present

## 2021-06-11 DIAGNOSIS — R531 Weakness: Secondary | ICD-10-CM | POA: Diagnosis not present

## 2021-06-11 DIAGNOSIS — N2581 Secondary hyperparathyroidism of renal origin: Secondary | ICD-10-CM | POA: Diagnosis not present

## 2021-06-11 DIAGNOSIS — D688 Other specified coagulation defects: Secondary | ICD-10-CM | POA: Diagnosis not present

## 2021-06-11 DIAGNOSIS — I10 Essential (primary) hypertension: Secondary | ICD-10-CM | POA: Diagnosis not present

## 2021-06-11 DIAGNOSIS — Z992 Dependence on renal dialysis: Secondary | ICD-10-CM | POA: Diagnosis not present

## 2021-06-11 DIAGNOSIS — T8249XA Other complication of vascular dialysis catheter, initial encounter: Secondary | ICD-10-CM | POA: Diagnosis not present

## 2021-06-12 DIAGNOSIS — I10 Essential (primary) hypertension: Secondary | ICD-10-CM | POA: Diagnosis not present

## 2021-06-13 DIAGNOSIS — Z743 Need for continuous supervision: Secondary | ICD-10-CM | POA: Diagnosis not present

## 2021-06-13 DIAGNOSIS — D688 Other specified coagulation defects: Secondary | ICD-10-CM | POA: Diagnosis not present

## 2021-06-13 DIAGNOSIS — R5383 Other fatigue: Secondary | ICD-10-CM | POA: Diagnosis not present

## 2021-06-13 DIAGNOSIS — K529 Noninfective gastroenteritis and colitis, unspecified: Secondary | ICD-10-CM | POA: Diagnosis not present

## 2021-06-13 DIAGNOSIS — I10 Essential (primary) hypertension: Secondary | ICD-10-CM | POA: Diagnosis not present

## 2021-06-13 DIAGNOSIS — D631 Anemia in chronic kidney disease: Secondary | ICD-10-CM | POA: Diagnosis not present

## 2021-06-13 DIAGNOSIS — Z7401 Bed confinement status: Secondary | ICD-10-CM | POA: Diagnosis not present

## 2021-06-13 DIAGNOSIS — T8249XA Other complication of vascular dialysis catheter, initial encounter: Secondary | ICD-10-CM | POA: Diagnosis not present

## 2021-06-13 DIAGNOSIS — N2581 Secondary hyperparathyroidism of renal origin: Secondary | ICD-10-CM | POA: Diagnosis not present

## 2021-06-13 DIAGNOSIS — N186 End stage renal disease: Secondary | ICD-10-CM | POA: Diagnosis not present

## 2021-06-13 DIAGNOSIS — Z992 Dependence on renal dialysis: Secondary | ICD-10-CM | POA: Diagnosis not present

## 2021-06-14 DIAGNOSIS — I10 Essential (primary) hypertension: Secondary | ICD-10-CM | POA: Diagnosis not present

## 2021-06-15 DIAGNOSIS — I10 Essential (primary) hypertension: Secondary | ICD-10-CM | POA: Diagnosis not present

## 2021-06-16 DIAGNOSIS — N186 End stage renal disease: Secondary | ICD-10-CM | POA: Diagnosis not present

## 2021-06-16 DIAGNOSIS — I10 Essential (primary) hypertension: Secondary | ICD-10-CM | POA: Diagnosis not present

## 2021-06-16 DIAGNOSIS — D688 Other specified coagulation defects: Secondary | ICD-10-CM | POA: Diagnosis not present

## 2021-06-16 DIAGNOSIS — T8249XA Other complication of vascular dialysis catheter, initial encounter: Secondary | ICD-10-CM | POA: Diagnosis not present

## 2021-06-16 DIAGNOSIS — Z7401 Bed confinement status: Secondary | ICD-10-CM | POA: Diagnosis not present

## 2021-06-16 DIAGNOSIS — R5381 Other malaise: Secondary | ICD-10-CM | POA: Diagnosis not present

## 2021-06-16 DIAGNOSIS — N2581 Secondary hyperparathyroidism of renal origin: Secondary | ICD-10-CM | POA: Diagnosis not present

## 2021-06-16 DIAGNOSIS — Z992 Dependence on renal dialysis: Secondary | ICD-10-CM | POA: Diagnosis not present

## 2021-06-16 DIAGNOSIS — R531 Weakness: Secondary | ICD-10-CM | POA: Diagnosis not present

## 2021-06-16 DIAGNOSIS — Z743 Need for continuous supervision: Secondary | ICD-10-CM | POA: Diagnosis not present

## 2021-06-17 DIAGNOSIS — I10 Essential (primary) hypertension: Secondary | ICD-10-CM | POA: Diagnosis not present

## 2021-06-18 DIAGNOSIS — N186 End stage renal disease: Secondary | ICD-10-CM | POA: Diagnosis not present

## 2021-06-18 DIAGNOSIS — D688 Other specified coagulation defects: Secondary | ICD-10-CM | POA: Diagnosis not present

## 2021-06-18 DIAGNOSIS — R531 Weakness: Secondary | ICD-10-CM | POA: Diagnosis not present

## 2021-06-18 DIAGNOSIS — Z7401 Bed confinement status: Secondary | ICD-10-CM | POA: Diagnosis not present

## 2021-06-18 DIAGNOSIS — T8249XA Other complication of vascular dialysis catheter, initial encounter: Secondary | ICD-10-CM | POA: Diagnosis not present

## 2021-06-18 DIAGNOSIS — Z743 Need for continuous supervision: Secondary | ICD-10-CM | POA: Diagnosis not present

## 2021-06-18 DIAGNOSIS — Z992 Dependence on renal dialysis: Secondary | ICD-10-CM | POA: Diagnosis not present

## 2021-06-18 DIAGNOSIS — I10 Essential (primary) hypertension: Secondary | ICD-10-CM | POA: Diagnosis not present

## 2021-06-18 DIAGNOSIS — R5381 Other malaise: Secondary | ICD-10-CM | POA: Diagnosis not present

## 2021-06-18 DIAGNOSIS — N2581 Secondary hyperparathyroidism of renal origin: Secondary | ICD-10-CM | POA: Diagnosis not present

## 2021-06-19 DIAGNOSIS — I10 Essential (primary) hypertension: Secondary | ICD-10-CM | POA: Diagnosis not present

## 2021-06-20 DIAGNOSIS — N186 End stage renal disease: Secondary | ICD-10-CM | POA: Diagnosis not present

## 2021-06-20 DIAGNOSIS — R531 Weakness: Secondary | ICD-10-CM | POA: Diagnosis not present

## 2021-06-20 DIAGNOSIS — I10 Essential (primary) hypertension: Secondary | ICD-10-CM | POA: Diagnosis not present

## 2021-06-20 DIAGNOSIS — R5381 Other malaise: Secondary | ICD-10-CM | POA: Diagnosis not present

## 2021-06-20 DIAGNOSIS — Z743 Need for continuous supervision: Secondary | ICD-10-CM | POA: Diagnosis not present

## 2021-06-20 DIAGNOSIS — Z992 Dependence on renal dialysis: Secondary | ICD-10-CM | POA: Diagnosis not present

## 2021-06-20 DIAGNOSIS — N2581 Secondary hyperparathyroidism of renal origin: Secondary | ICD-10-CM | POA: Diagnosis not present

## 2021-06-20 DIAGNOSIS — Z7401 Bed confinement status: Secondary | ICD-10-CM | POA: Diagnosis not present

## 2021-06-20 DIAGNOSIS — D688 Other specified coagulation defects: Secondary | ICD-10-CM | POA: Diagnosis not present

## 2021-06-20 DIAGNOSIS — T8249XA Other complication of vascular dialysis catheter, initial encounter: Secondary | ICD-10-CM | POA: Diagnosis not present

## 2021-06-21 DIAGNOSIS — I10 Essential (primary) hypertension: Secondary | ICD-10-CM | POA: Diagnosis not present

## 2021-06-22 DIAGNOSIS — I10 Essential (primary) hypertension: Secondary | ICD-10-CM | POA: Diagnosis not present

## 2021-06-23 DIAGNOSIS — T8249XA Other complication of vascular dialysis catheter, initial encounter: Secondary | ICD-10-CM | POA: Diagnosis not present

## 2021-06-23 DIAGNOSIS — Z743 Need for continuous supervision: Secondary | ICD-10-CM | POA: Diagnosis not present

## 2021-06-23 DIAGNOSIS — I10 Essential (primary) hypertension: Secondary | ICD-10-CM | POA: Diagnosis not present

## 2021-06-23 DIAGNOSIS — D688 Other specified coagulation defects: Secondary | ICD-10-CM | POA: Diagnosis not present

## 2021-06-23 DIAGNOSIS — Z992 Dependence on renal dialysis: Secondary | ICD-10-CM | POA: Diagnosis not present

## 2021-06-23 DIAGNOSIS — N186 End stage renal disease: Secondary | ICD-10-CM | POA: Diagnosis not present

## 2021-06-23 DIAGNOSIS — Z7401 Bed confinement status: Secondary | ICD-10-CM | POA: Diagnosis not present

## 2021-06-23 DIAGNOSIS — N2581 Secondary hyperparathyroidism of renal origin: Secondary | ICD-10-CM | POA: Diagnosis not present

## 2021-06-24 DIAGNOSIS — I10 Essential (primary) hypertension: Secondary | ICD-10-CM | POA: Diagnosis not present

## 2021-06-25 DIAGNOSIS — T8249XA Other complication of vascular dialysis catheter, initial encounter: Secondary | ICD-10-CM | POA: Diagnosis not present

## 2021-06-25 DIAGNOSIS — R531 Weakness: Secondary | ICD-10-CM | POA: Diagnosis not present

## 2021-06-25 DIAGNOSIS — Z992 Dependence on renal dialysis: Secondary | ICD-10-CM | POA: Diagnosis not present

## 2021-06-25 DIAGNOSIS — Z7401 Bed confinement status: Secondary | ICD-10-CM | POA: Diagnosis not present

## 2021-06-25 DIAGNOSIS — R5381 Other malaise: Secondary | ICD-10-CM | POA: Diagnosis not present

## 2021-06-25 DIAGNOSIS — N2581 Secondary hyperparathyroidism of renal origin: Secondary | ICD-10-CM | POA: Diagnosis not present

## 2021-06-25 DIAGNOSIS — Z743 Need for continuous supervision: Secondary | ICD-10-CM | POA: Diagnosis not present

## 2021-06-25 DIAGNOSIS — D688 Other specified coagulation defects: Secondary | ICD-10-CM | POA: Diagnosis not present

## 2021-06-25 DIAGNOSIS — N186 End stage renal disease: Secondary | ICD-10-CM | POA: Diagnosis not present

## 2021-06-25 DIAGNOSIS — I10 Essential (primary) hypertension: Secondary | ICD-10-CM | POA: Diagnosis not present

## 2021-06-26 DIAGNOSIS — I10 Essential (primary) hypertension: Secondary | ICD-10-CM | POA: Diagnosis not present

## 2021-06-27 DIAGNOSIS — I129 Hypertensive chronic kidney disease with stage 1 through stage 4 chronic kidney disease, or unspecified chronic kidney disease: Secondary | ICD-10-CM | POA: Diagnosis not present

## 2021-06-27 DIAGNOSIS — Z743 Need for continuous supervision: Secondary | ICD-10-CM | POA: Diagnosis not present

## 2021-06-27 DIAGNOSIS — N2581 Secondary hyperparathyroidism of renal origin: Secondary | ICD-10-CM | POA: Diagnosis not present

## 2021-06-27 DIAGNOSIS — D688 Other specified coagulation defects: Secondary | ICD-10-CM | POA: Diagnosis not present

## 2021-06-27 DIAGNOSIS — Z7401 Bed confinement status: Secondary | ICD-10-CM | POA: Diagnosis not present

## 2021-06-27 DIAGNOSIS — I10 Essential (primary) hypertension: Secondary | ICD-10-CM | POA: Diagnosis not present

## 2021-06-27 DIAGNOSIS — Z992 Dependence on renal dialysis: Secondary | ICD-10-CM | POA: Diagnosis not present

## 2021-06-27 DIAGNOSIS — N186 End stage renal disease: Secondary | ICD-10-CM | POA: Diagnosis not present

## 2021-06-27 DIAGNOSIS — T8249XA Other complication of vascular dialysis catheter, initial encounter: Secondary | ICD-10-CM | POA: Diagnosis not present

## 2021-06-27 DIAGNOSIS — R5381 Other malaise: Secondary | ICD-10-CM | POA: Diagnosis not present

## 2021-06-27 DIAGNOSIS — R531 Weakness: Secondary | ICD-10-CM | POA: Diagnosis not present

## 2021-06-28 DIAGNOSIS — I10 Essential (primary) hypertension: Secondary | ICD-10-CM | POA: Diagnosis not present

## 2021-06-29 DIAGNOSIS — I10 Essential (primary) hypertension: Secondary | ICD-10-CM | POA: Diagnosis not present

## 2021-06-30 DIAGNOSIS — N2581 Secondary hyperparathyroidism of renal origin: Secondary | ICD-10-CM | POA: Diagnosis not present

## 2021-06-30 DIAGNOSIS — R5383 Other fatigue: Secondary | ICD-10-CM | POA: Diagnosis not present

## 2021-06-30 DIAGNOSIS — Z7401 Bed confinement status: Secondary | ICD-10-CM | POA: Diagnosis not present

## 2021-06-30 DIAGNOSIS — T8249XA Other complication of vascular dialysis catheter, initial encounter: Secondary | ICD-10-CM | POA: Diagnosis not present

## 2021-06-30 DIAGNOSIS — Z743 Need for continuous supervision: Secondary | ICD-10-CM | POA: Diagnosis not present

## 2021-06-30 DIAGNOSIS — N186 End stage renal disease: Secondary | ICD-10-CM | POA: Diagnosis not present

## 2021-06-30 DIAGNOSIS — I10 Essential (primary) hypertension: Secondary | ICD-10-CM | POA: Diagnosis not present

## 2021-06-30 DIAGNOSIS — D688 Other specified coagulation defects: Secondary | ICD-10-CM | POA: Diagnosis not present

## 2021-06-30 DIAGNOSIS — I159 Secondary hypertension, unspecified: Secondary | ICD-10-CM | POA: Diagnosis not present

## 2021-06-30 DIAGNOSIS — Z992 Dependence on renal dialysis: Secondary | ICD-10-CM | POA: Diagnosis not present

## 2021-07-01 DIAGNOSIS — I10 Essential (primary) hypertension: Secondary | ICD-10-CM | POA: Diagnosis not present

## 2021-07-02 DIAGNOSIS — Z743 Need for continuous supervision: Secondary | ICD-10-CM | POA: Diagnosis not present

## 2021-07-02 DIAGNOSIS — Z992 Dependence on renal dialysis: Secondary | ICD-10-CM | POA: Diagnosis not present

## 2021-07-02 DIAGNOSIS — D688 Other specified coagulation defects: Secondary | ICD-10-CM | POA: Diagnosis not present

## 2021-07-02 DIAGNOSIS — I10 Essential (primary) hypertension: Secondary | ICD-10-CM | POA: Diagnosis not present

## 2021-07-02 DIAGNOSIS — R5383 Other fatigue: Secondary | ICD-10-CM | POA: Diagnosis not present

## 2021-07-02 DIAGNOSIS — N186 End stage renal disease: Secondary | ICD-10-CM | POA: Diagnosis not present

## 2021-07-02 DIAGNOSIS — N2581 Secondary hyperparathyroidism of renal origin: Secondary | ICD-10-CM | POA: Diagnosis not present

## 2021-07-02 DIAGNOSIS — I159 Secondary hypertension, unspecified: Secondary | ICD-10-CM | POA: Diagnosis not present

## 2021-07-02 DIAGNOSIS — R531 Weakness: Secondary | ICD-10-CM | POA: Diagnosis not present

## 2021-07-02 DIAGNOSIS — T8249XA Other complication of vascular dialysis catheter, initial encounter: Secondary | ICD-10-CM | POA: Diagnosis not present

## 2021-07-03 DIAGNOSIS — I10 Essential (primary) hypertension: Secondary | ICD-10-CM | POA: Diagnosis not present

## 2021-07-04 DIAGNOSIS — R5381 Other malaise: Secondary | ICD-10-CM | POA: Diagnosis not present

## 2021-07-04 DIAGNOSIS — N2581 Secondary hyperparathyroidism of renal origin: Secondary | ICD-10-CM | POA: Diagnosis not present

## 2021-07-04 DIAGNOSIS — R531 Weakness: Secondary | ICD-10-CM | POA: Diagnosis not present

## 2021-07-04 DIAGNOSIS — Z992 Dependence on renal dialysis: Secondary | ICD-10-CM | POA: Diagnosis not present

## 2021-07-04 DIAGNOSIS — Z7401 Bed confinement status: Secondary | ICD-10-CM | POA: Diagnosis not present

## 2021-07-04 DIAGNOSIS — I159 Secondary hypertension, unspecified: Secondary | ICD-10-CM | POA: Diagnosis not present

## 2021-07-04 DIAGNOSIS — N186 End stage renal disease: Secondary | ICD-10-CM | POA: Diagnosis not present

## 2021-07-04 DIAGNOSIS — Z743 Need for continuous supervision: Secondary | ICD-10-CM | POA: Diagnosis not present

## 2021-07-04 DIAGNOSIS — D688 Other specified coagulation defects: Secondary | ICD-10-CM | POA: Diagnosis not present

## 2021-07-04 DIAGNOSIS — T8249XA Other complication of vascular dialysis catheter, initial encounter: Secondary | ICD-10-CM | POA: Diagnosis not present

## 2021-07-04 DIAGNOSIS — I10 Essential (primary) hypertension: Secondary | ICD-10-CM | POA: Diagnosis not present

## 2021-07-05 DIAGNOSIS — R1084 Generalized abdominal pain: Secondary | ICD-10-CM | POA: Diagnosis not present

## 2021-07-05 DIAGNOSIS — Z992 Dependence on renal dialysis: Secondary | ICD-10-CM | POA: Diagnosis not present

## 2021-07-05 DIAGNOSIS — T8249XA Other complication of vascular dialysis catheter, initial encounter: Secondary | ICD-10-CM | POA: Diagnosis not present

## 2021-07-05 DIAGNOSIS — N2581 Secondary hyperparathyroidism of renal origin: Secondary | ICD-10-CM | POA: Diagnosis not present

## 2021-07-05 DIAGNOSIS — I159 Secondary hypertension, unspecified: Secondary | ICD-10-CM | POA: Diagnosis not present

## 2021-07-05 DIAGNOSIS — Z7401 Bed confinement status: Secondary | ICD-10-CM | POA: Diagnosis not present

## 2021-07-05 DIAGNOSIS — D688 Other specified coagulation defects: Secondary | ICD-10-CM | POA: Diagnosis not present

## 2021-07-05 DIAGNOSIS — I10 Essential (primary) hypertension: Secondary | ICD-10-CM | POA: Diagnosis not present

## 2021-07-05 DIAGNOSIS — Z743 Need for continuous supervision: Secondary | ICD-10-CM | POA: Diagnosis not present

## 2021-07-05 DIAGNOSIS — N186 End stage renal disease: Secondary | ICD-10-CM | POA: Diagnosis not present

## 2021-07-06 DIAGNOSIS — I10 Essential (primary) hypertension: Secondary | ICD-10-CM | POA: Diagnosis not present

## 2021-07-07 DIAGNOSIS — T8249XA Other complication of vascular dialysis catheter, initial encounter: Secondary | ICD-10-CM | POA: Diagnosis not present

## 2021-07-07 DIAGNOSIS — N2581 Secondary hyperparathyroidism of renal origin: Secondary | ICD-10-CM | POA: Diagnosis not present

## 2021-07-07 DIAGNOSIS — Z743 Need for continuous supervision: Secondary | ICD-10-CM | POA: Diagnosis not present

## 2021-07-07 DIAGNOSIS — D631 Anemia in chronic kidney disease: Secondary | ICD-10-CM | POA: Diagnosis not present

## 2021-07-07 DIAGNOSIS — S8990XA Unspecified injury of unspecified lower leg, initial encounter: Secondary | ICD-10-CM | POA: Diagnosis not present

## 2021-07-07 DIAGNOSIS — R531 Weakness: Secondary | ICD-10-CM | POA: Diagnosis not present

## 2021-07-07 DIAGNOSIS — Z992 Dependence on renal dialysis: Secondary | ICD-10-CM | POA: Diagnosis not present

## 2021-07-07 DIAGNOSIS — D688 Other specified coagulation defects: Secondary | ICD-10-CM | POA: Diagnosis not present

## 2021-07-07 DIAGNOSIS — E11621 Type 2 diabetes mellitus with foot ulcer: Secondary | ICD-10-CM | POA: Diagnosis not present

## 2021-07-07 DIAGNOSIS — R5381 Other malaise: Secondary | ICD-10-CM | POA: Diagnosis not present

## 2021-07-07 DIAGNOSIS — N186 End stage renal disease: Secondary | ICD-10-CM | POA: Diagnosis not present

## 2021-07-07 DIAGNOSIS — E8770 Fluid overload, unspecified: Secondary | ICD-10-CM | POA: Diagnosis not present

## 2021-07-07 DIAGNOSIS — I10 Essential (primary) hypertension: Secondary | ICD-10-CM | POA: Diagnosis not present

## 2021-07-07 DIAGNOSIS — Z7401 Bed confinement status: Secondary | ICD-10-CM | POA: Diagnosis not present

## 2021-07-08 DIAGNOSIS — I10 Essential (primary) hypertension: Secondary | ICD-10-CM | POA: Diagnosis not present

## 2021-07-08 DIAGNOSIS — Z4781 Encounter for orthopedic aftercare following surgical amputation: Secondary | ICD-10-CM | POA: Diagnosis not present

## 2021-07-08 DIAGNOSIS — S91302A Unspecified open wound, left foot, initial encounter: Secondary | ICD-10-CM | POA: Diagnosis not present

## 2021-07-09 DIAGNOSIS — N2581 Secondary hyperparathyroidism of renal origin: Secondary | ICD-10-CM | POA: Diagnosis not present

## 2021-07-09 DIAGNOSIS — T8249XA Other complication of vascular dialysis catheter, initial encounter: Secondary | ICD-10-CM | POA: Diagnosis not present

## 2021-07-09 DIAGNOSIS — N186 End stage renal disease: Secondary | ICD-10-CM | POA: Diagnosis not present

## 2021-07-09 DIAGNOSIS — E8770 Fluid overload, unspecified: Secondary | ICD-10-CM | POA: Diagnosis not present

## 2021-07-09 DIAGNOSIS — Z992 Dependence on renal dialysis: Secondary | ICD-10-CM | POA: Diagnosis not present

## 2021-07-09 DIAGNOSIS — I10 Essential (primary) hypertension: Secondary | ICD-10-CM | POA: Diagnosis not present

## 2021-07-09 DIAGNOSIS — D688 Other specified coagulation defects: Secondary | ICD-10-CM | POA: Diagnosis not present

## 2021-07-09 DIAGNOSIS — Z743 Need for continuous supervision: Secondary | ICD-10-CM | POA: Diagnosis not present

## 2021-07-09 DIAGNOSIS — D631 Anemia in chronic kidney disease: Secondary | ICD-10-CM | POA: Diagnosis not present

## 2021-07-09 DIAGNOSIS — R531 Weakness: Secondary | ICD-10-CM | POA: Diagnosis not present

## 2021-07-09 DIAGNOSIS — E11621 Type 2 diabetes mellitus with foot ulcer: Secondary | ICD-10-CM | POA: Diagnosis not present

## 2021-07-10 ENCOUNTER — Ambulatory Visit: Payer: Medicare HMO | Admitting: Physical Therapy

## 2021-07-10 DIAGNOSIS — I10 Essential (primary) hypertension: Secondary | ICD-10-CM | POA: Diagnosis not present

## 2021-07-11 DIAGNOSIS — Z743 Need for continuous supervision: Secondary | ICD-10-CM | POA: Diagnosis not present

## 2021-07-11 DIAGNOSIS — E8770 Fluid overload, unspecified: Secondary | ICD-10-CM | POA: Diagnosis not present

## 2021-07-11 DIAGNOSIS — I10 Essential (primary) hypertension: Secondary | ICD-10-CM | POA: Diagnosis not present

## 2021-07-11 DIAGNOSIS — R531 Weakness: Secondary | ICD-10-CM | POA: Diagnosis not present

## 2021-07-11 DIAGNOSIS — Z992 Dependence on renal dialysis: Secondary | ICD-10-CM | POA: Diagnosis not present

## 2021-07-11 DIAGNOSIS — T8249XA Other complication of vascular dialysis catheter, initial encounter: Secondary | ICD-10-CM | POA: Diagnosis not present

## 2021-07-11 DIAGNOSIS — R4182 Altered mental status, unspecified: Secondary | ICD-10-CM | POA: Diagnosis not present

## 2021-07-11 DIAGNOSIS — N2581 Secondary hyperparathyroidism of renal origin: Secondary | ICD-10-CM | POA: Diagnosis not present

## 2021-07-11 DIAGNOSIS — Z7401 Bed confinement status: Secondary | ICD-10-CM | POA: Diagnosis not present

## 2021-07-11 DIAGNOSIS — D631 Anemia in chronic kidney disease: Secondary | ICD-10-CM | POA: Diagnosis not present

## 2021-07-11 DIAGNOSIS — E11621 Type 2 diabetes mellitus with foot ulcer: Secondary | ICD-10-CM | POA: Diagnosis not present

## 2021-07-11 DIAGNOSIS — N186 End stage renal disease: Secondary | ICD-10-CM | POA: Diagnosis not present

## 2021-07-11 DIAGNOSIS — D688 Other specified coagulation defects: Secondary | ICD-10-CM | POA: Diagnosis not present

## 2021-07-12 DIAGNOSIS — N186 End stage renal disease: Secondary | ICD-10-CM | POA: Diagnosis not present

## 2021-07-12 DIAGNOSIS — Z992 Dependence on renal dialysis: Secondary | ICD-10-CM | POA: Diagnosis not present

## 2021-07-12 DIAGNOSIS — E11621 Type 2 diabetes mellitus with foot ulcer: Secondary | ICD-10-CM | POA: Diagnosis not present

## 2021-07-12 DIAGNOSIS — E8770 Fluid overload, unspecified: Secondary | ICD-10-CM | POA: Diagnosis not present

## 2021-07-12 DIAGNOSIS — I10 Essential (primary) hypertension: Secondary | ICD-10-CM | POA: Diagnosis not present

## 2021-07-12 DIAGNOSIS — T8249XA Other complication of vascular dialysis catheter, initial encounter: Secondary | ICD-10-CM | POA: Diagnosis not present

## 2021-07-12 DIAGNOSIS — D688 Other specified coagulation defects: Secondary | ICD-10-CM | POA: Diagnosis not present

## 2021-07-12 DIAGNOSIS — N2581 Secondary hyperparathyroidism of renal origin: Secondary | ICD-10-CM | POA: Diagnosis not present

## 2021-07-12 DIAGNOSIS — D631 Anemia in chronic kidney disease: Secondary | ICD-10-CM | POA: Diagnosis not present

## 2021-07-13 DIAGNOSIS — I10 Essential (primary) hypertension: Secondary | ICD-10-CM | POA: Diagnosis not present

## 2021-07-14 ENCOUNTER — Other Ambulatory Visit (INDEPENDENT_AMBULATORY_CARE_PROVIDER_SITE_OTHER): Payer: Self-pay | Admitting: Primary Care

## 2021-07-14 DIAGNOSIS — D688 Other specified coagulation defects: Secondary | ICD-10-CM | POA: Diagnosis not present

## 2021-07-14 DIAGNOSIS — R0902 Hypoxemia: Secondary | ICD-10-CM | POA: Diagnosis not present

## 2021-07-14 DIAGNOSIS — Z7401 Bed confinement status: Secondary | ICD-10-CM | POA: Diagnosis not present

## 2021-07-14 DIAGNOSIS — N186 End stage renal disease: Secondary | ICD-10-CM | POA: Diagnosis not present

## 2021-07-14 DIAGNOSIS — N2581 Secondary hyperparathyroidism of renal origin: Secondary | ICD-10-CM | POA: Diagnosis not present

## 2021-07-14 DIAGNOSIS — T8249XA Other complication of vascular dialysis catheter, initial encounter: Secondary | ICD-10-CM | POA: Diagnosis not present

## 2021-07-14 DIAGNOSIS — Z992 Dependence on renal dialysis: Secondary | ICD-10-CM | POA: Diagnosis not present

## 2021-07-14 DIAGNOSIS — I10 Essential (primary) hypertension: Secondary | ICD-10-CM | POA: Diagnosis not present

## 2021-07-14 DIAGNOSIS — E8779 Other fluid overload: Secondary | ICD-10-CM | POA: Diagnosis not present

## 2021-07-14 DIAGNOSIS — D689 Coagulation defect, unspecified: Secondary | ICD-10-CM | POA: Diagnosis not present

## 2021-07-14 NOTE — Telephone Encounter (Signed)
Sent to PCP ?

## 2021-07-14 NOTE — Telephone Encounter (Signed)
Copied from [place] [id number]. Topic: Quick Communication - Rx Refill/Question >> Jul 14, 2021  5:02 PM Yvette Rack wrote: Medication: gabapentin (NEURONTIN) 100 MG capsule  Has the patient contacted their pharmacy? Yes.  Pt told to contact provider (Agent: If no, request that the patient contact the pharmacy for the refill.) (Agent: If yes, when and what did the pharmacy advise?)  Preferred Pharmacy (with phone number or street name): [facility], [address] Phone: [phone]   Fax: [phone]  Has the patient been seen for an appointment in the last year OR does the patient have an upcoming appointment? Yes.    Agent: Please be advised that RX refills may take up to 3 business days. We ask that you follow-up with your pharmacy.

## 2021-07-15 DIAGNOSIS — I10 Essential (primary) hypertension: Secondary | ICD-10-CM | POA: Diagnosis not present

## 2021-07-15 MED ORDER — GABAPENTIN 100 MG PO CAPS: 100.0000 mg | ORAL_CAPSULE | Freq: Three times a day (TID) | ORAL | 1 refills | Status: DC

## 2021-07-16 DIAGNOSIS — D689 Coagulation defect, unspecified: Secondary | ICD-10-CM | POA: Diagnosis not present

## 2021-07-16 DIAGNOSIS — R531 Weakness: Secondary | ICD-10-CM | POA: Diagnosis not present

## 2021-07-16 DIAGNOSIS — N2581 Secondary hyperparathyroidism of renal origin: Secondary | ICD-10-CM | POA: Diagnosis not present

## 2021-07-16 DIAGNOSIS — D688 Other specified coagulation defects: Secondary | ICD-10-CM | POA: Diagnosis not present

## 2021-07-16 DIAGNOSIS — T8249XA Other complication of vascular dialysis catheter, initial encounter: Secondary | ICD-10-CM | POA: Diagnosis not present

## 2021-07-16 DIAGNOSIS — E8779 Other fluid overload: Secondary | ICD-10-CM | POA: Diagnosis not present

## 2021-07-16 DIAGNOSIS — R5383 Other fatigue: Secondary | ICD-10-CM | POA: Diagnosis not present

## 2021-07-16 DIAGNOSIS — G8929 Other chronic pain: Secondary | ICD-10-CM | POA: Diagnosis not present

## 2021-07-16 DIAGNOSIS — Z7401 Bed confinement status: Secondary | ICD-10-CM | POA: Diagnosis not present

## 2021-07-16 DIAGNOSIS — Z992 Dependence on renal dialysis: Secondary | ICD-10-CM | POA: Diagnosis not present

## 2021-07-16 DIAGNOSIS — I10 Essential (primary) hypertension: Secondary | ICD-10-CM | POA: Diagnosis not present

## 2021-07-16 DIAGNOSIS — N186 End stage renal disease: Secondary | ICD-10-CM | POA: Diagnosis not present

## 2021-07-17 DIAGNOSIS — I10 Essential (primary) hypertension: Secondary | ICD-10-CM | POA: Diagnosis not present

## 2021-07-18 DIAGNOSIS — Z743 Need for continuous supervision: Secondary | ICD-10-CM | POA: Diagnosis not present

## 2021-07-18 DIAGNOSIS — D688 Other specified coagulation defects: Secondary | ICD-10-CM | POA: Diagnosis not present

## 2021-07-18 DIAGNOSIS — Z992 Dependence on renal dialysis: Secondary | ICD-10-CM | POA: Diagnosis not present

## 2021-07-18 DIAGNOSIS — N186 End stage renal disease: Secondary | ICD-10-CM | POA: Diagnosis not present

## 2021-07-18 DIAGNOSIS — I10 Essential (primary) hypertension: Secondary | ICD-10-CM | POA: Diagnosis not present

## 2021-07-18 DIAGNOSIS — R531 Weakness: Secondary | ICD-10-CM | POA: Diagnosis not present

## 2021-07-18 DIAGNOSIS — Z7401 Bed confinement status: Secondary | ICD-10-CM | POA: Diagnosis not present

## 2021-07-18 DIAGNOSIS — E8779 Other fluid overload: Secondary | ICD-10-CM | POA: Diagnosis not present

## 2021-07-18 DIAGNOSIS — T8249XA Other complication of vascular dialysis catheter, initial encounter: Secondary | ICD-10-CM | POA: Diagnosis not present

## 2021-07-18 DIAGNOSIS — D689 Coagulation defect, unspecified: Secondary | ICD-10-CM | POA: Diagnosis not present

## 2021-07-18 DIAGNOSIS — N2581 Secondary hyperparathyroidism of renal origin: Secondary | ICD-10-CM | POA: Diagnosis not present

## 2021-07-19 DIAGNOSIS — D688 Other specified coagulation defects: Secondary | ICD-10-CM | POA: Diagnosis not present

## 2021-07-19 DIAGNOSIS — R5383 Other fatigue: Secondary | ICD-10-CM | POA: Diagnosis not present

## 2021-07-19 DIAGNOSIS — I10 Essential (primary) hypertension: Secondary | ICD-10-CM | POA: Diagnosis not present

## 2021-07-19 DIAGNOSIS — Z7401 Bed confinement status: Secondary | ICD-10-CM | POA: Diagnosis not present

## 2021-07-19 DIAGNOSIS — N2581 Secondary hyperparathyroidism of renal origin: Secondary | ICD-10-CM | POA: Diagnosis not present

## 2021-07-19 DIAGNOSIS — T8249XA Other complication of vascular dialysis catheter, initial encounter: Secondary | ICD-10-CM | POA: Diagnosis not present

## 2021-07-19 DIAGNOSIS — D689 Coagulation defect, unspecified: Secondary | ICD-10-CM | POA: Diagnosis not present

## 2021-07-19 DIAGNOSIS — N186 End stage renal disease: Secondary | ICD-10-CM | POA: Diagnosis not present

## 2021-07-19 DIAGNOSIS — R531 Weakness: Secondary | ICD-10-CM | POA: Diagnosis not present

## 2021-07-19 DIAGNOSIS — E8779 Other fluid overload: Secondary | ICD-10-CM | POA: Diagnosis not present

## 2021-07-19 DIAGNOSIS — Z992 Dependence on renal dialysis: Secondary | ICD-10-CM | POA: Diagnosis not present

## 2021-07-20 DIAGNOSIS — I10 Essential (primary) hypertension: Secondary | ICD-10-CM | POA: Diagnosis not present

## 2021-07-21 DIAGNOSIS — T8249XA Other complication of vascular dialysis catheter, initial encounter: Secondary | ICD-10-CM | POA: Diagnosis not present

## 2021-07-21 DIAGNOSIS — N186 End stage renal disease: Secondary | ICD-10-CM | POA: Diagnosis not present

## 2021-07-21 DIAGNOSIS — I10 Essential (primary) hypertension: Secondary | ICD-10-CM | POA: Diagnosis not present

## 2021-07-21 DIAGNOSIS — N2581 Secondary hyperparathyroidism of renal origin: Secondary | ICD-10-CM | POA: Diagnosis not present

## 2021-07-21 DIAGNOSIS — Z743 Need for continuous supervision: Secondary | ICD-10-CM | POA: Diagnosis not present

## 2021-07-21 DIAGNOSIS — Z992 Dependence on renal dialysis: Secondary | ICD-10-CM | POA: Diagnosis not present

## 2021-07-21 DIAGNOSIS — D689 Coagulation defect, unspecified: Secondary | ICD-10-CM | POA: Diagnosis not present

## 2021-07-21 DIAGNOSIS — Z7401 Bed confinement status: Secondary | ICD-10-CM | POA: Diagnosis not present

## 2021-07-21 DIAGNOSIS — R5381 Other malaise: Secondary | ICD-10-CM | POA: Diagnosis not present

## 2021-07-21 DIAGNOSIS — R531 Weakness: Secondary | ICD-10-CM | POA: Diagnosis not present

## 2021-07-22 ENCOUNTER — Other Ambulatory Visit: Payer: Self-pay

## 2021-07-22 ENCOUNTER — Ambulatory Visit: Payer: Medicare HMO | Admitting: Physical Therapy

## 2021-07-22 ENCOUNTER — Ambulatory Visit: Payer: Medicare HMO | Attending: Primary Care | Admitting: Physical Therapy

## 2021-07-22 DIAGNOSIS — M6281 Muscle weakness (generalized): Secondary | ICD-10-CM | POA: Diagnosis not present

## 2021-07-22 DIAGNOSIS — R2689 Other abnormalities of gait and mobility: Secondary | ICD-10-CM

## 2021-07-22 DIAGNOSIS — R2681 Unsteadiness on feet: Secondary | ICD-10-CM

## 2021-07-22 DIAGNOSIS — I10 Essential (primary) hypertension: Secondary | ICD-10-CM | POA: Diagnosis not present

## 2021-07-23 ENCOUNTER — Encounter: Payer: Self-pay | Admitting: Physical Therapy

## 2021-07-23 DIAGNOSIS — N2581 Secondary hyperparathyroidism of renal origin: Secondary | ICD-10-CM | POA: Diagnosis not present

## 2021-07-23 DIAGNOSIS — Z992 Dependence on renal dialysis: Secondary | ICD-10-CM | POA: Diagnosis not present

## 2021-07-23 DIAGNOSIS — D689 Coagulation defect, unspecified: Secondary | ICD-10-CM | POA: Diagnosis not present

## 2021-07-23 DIAGNOSIS — R531 Weakness: Secondary | ICD-10-CM | POA: Diagnosis not present

## 2021-07-23 DIAGNOSIS — I10 Essential (primary) hypertension: Secondary | ICD-10-CM | POA: Diagnosis not present

## 2021-07-23 DIAGNOSIS — Z743 Need for continuous supervision: Secondary | ICD-10-CM | POA: Diagnosis not present

## 2021-07-23 DIAGNOSIS — N186 End stage renal disease: Secondary | ICD-10-CM | POA: Diagnosis not present

## 2021-07-23 DIAGNOSIS — T8249XA Other complication of vascular dialysis catheter, initial encounter: Secondary | ICD-10-CM | POA: Diagnosis not present

## 2021-07-23 DIAGNOSIS — Z7401 Bed confinement status: Secondary | ICD-10-CM | POA: Diagnosis not present

## 2021-07-23 NOTE — Therapy (Signed)
[facility] [address] [place], [place], [address] Phone: [phone]   Fax:  [phone]  Physical Therapy Evaluation  Patient Details  Name: [name] MRN: [id number] Date of Birth: Nov 20, 1980 Referring Provider (PT): Juluis Mire, NP   Encounter Date: 07/22/2021   PT End of Session - 07/23/21 1754     Visit Number 1    Number of Visits 1    Authorization Type [employer]    Authorization Time Period 07-22-21 - 08-22-21    PT Start Time 1315    PT Stop Time 1419    PT Time Calculation (min) 64 min    Equipment Utilized During Treatment Other (comment)   power wheelchair used for trial   Activity Tolerance Patient tolerated treatment well    Behavior During Therapy [facility] for tasks assessed/performed             Past Medical History:  Diagnosis Date   Anemia    Diabetes mellitus without complication ([place])    Diabetic gastroparesis ([place])    Dialysis patient [facility])    Hypertension    Renal disorder    Dialysis   Sepsis [facility])     Past Surgical History:  Procedure Laterality Date   AMPUTATION Right 02/02/2018   Procedure: RIGHT FIFTH TOE AND METATARSAL AMPUTATION. Filetted toe flap metatarsal resection. Debridement Plantar Foot wound;  Surgeon: Evelina Bucy, DPM;  Location: [place];  Service: Podiatry;  Laterality: Right;   AMPUTATION Left 08/20/2018   Procedure: FIFTH METATARSAL BONE BIOPSY;  Surgeon: Evelina Bucy, DPM;  Location: [place];  Service: Podiatry;  Laterality: Left;   AMPUTATION Left 10/28/2018   Procedure: LEFT GREAT TOE AMPUTATION;  Surgeon: Evelina Bucy, DPM;  Location: [place];  Service: Podiatry;  Laterality: Left;   AMPUTATION Left 09/16/2020   Procedure: AMPUTATION BELOW KNEE;  Surgeon: Rosetta Posner, MD;  Location: [place];  Service: Vascular;  Laterality: Left;   AMPUTATION Left 10/15/2020   Procedure: AMPUTATION ABOVE KNEE;  Surgeon: Elam Dutch, MD;  Location: [place];  Service: Vascular;  Laterality: Left;   APPLICATION OF WOUND VAC  02/02/2018   Procedure: APPLICATION OF WOUND VAC  Right Foot;  Surgeon: Evelina Bucy, DPM;  Location: [place];  Service: Podiatry;;   APPLICATION OF WOUND VAC Left 10/28/2018   Procedure: APPLICATION OF WOUND VAC LEFT TOE;  Surgeon: Evelina Bucy, DPM;  Location: [place];  Service: Podiatry;  Laterality: Left;   APPLICATION OF WOUND VAC Left 11/01/2018   Procedure: APPLICATION OF WOUND VAC;  Surgeon: Evelina Bucy, DPM;  Location: [place];  Service: Podiatry;  Laterality: Left;   AV FISTULA PLACEMENT     left arm.   AV FISTULA PLACEMENT Right 12/22/2016   Procedure: INSERTION OF ARTERIOVENOUS (AV) GORE-TEX GRAFT ARM;  Surgeon: Elam Dutch, MD;  Location: [place];  Service: Vascular;  Laterality: Right;   AV FISTULA PLACEMENT Left 05/26/2018   Procedure: INSERTION OF  ARTERIOVENOUS (AV) GORE-TEX GRAFT LEFT ARM;  Surgeon: Serafina Mitchell, MD;  Location: [place];  Service: Vascular;  Laterality: Left;   EYE SURGERY     I & D EXTREMITY Right 10/31/2017   Procedure: IRRIGATION AND DEBRIDEMENT RIGHT FOOT;  Surgeon: Evelina Bucy, DPM;  Location: [place];  Service: Podiatry;  Laterality: Right;   I & D EXTREMITY Left 08/20/2018   Procedure: IRRIGATION AND DEBRIDEMENT EXTREMITY WITH SECONDARY WOUND CLOSUREAND APPLICATION OF WOUND VAC  LEFT FOOT;  Surgeon: Evelina Bucy, DPM;  Location: [place];  Service: Podiatry;  Laterality: Left;   I & D EXTREMITY Left 10/20/2018   Procedure: IRRIGATION AND DEBRIDEMENT LEFT FOOT  DEBRIDEMENT LATERAL FOOT WOUND;  Surgeon: Evelina Bucy, DPM;  Location: [place];  Service: Podiatry;  Laterality: Left;   I & D EXTREMITY Left 10/28/2018   Procedure: IRRIGATION AND DEBRIDEMENT LEFT TOE;  Surgeon: Evelina Bucy, DPM;  Location: [place];  Service: Podiatry;  Laterality: Left;   I & D EXTREMITY Left 09/14/2020   Procedure: IRRIGATION AND DEBRIDEMENT WRIST;  Surgeon: Leanora Cover, MD;  Location: [place];   Service: Orthopedics;  Laterality: Left;   INSERTION OF DIALYSIS CATHETER     Right subclavian   IR AV DIALY SHUNT INTRO NEEDLE/INTRACATH INITIAL W/PTA/IMG RIGHT Right 02/05/2018   IR FLUORO GUIDE CV LINE RIGHT  01/31/2020   IR THROMBECTOMY AV FISTULA W/THROMBOLYSIS/PTA INC/SHUNT/IMG LEFT Left 08/24/2018   IR THROMBECTOMY AV FISTULA W/THROMBOLYSIS/PTA INC/SHUNT/IMG LEFT Left 01/06/2019   IR US GUIDE VASC ACCESS LEFT  08/24/2018   IR US GUIDE VASC ACCESS RIGHT  02/05/2018   IR US GUIDE VASC ACCESS RIGHT  01/31/2020   IRRIGATION AND DEBRIDEMENT FOOT Right 10/23/2018   Procedure: Irrigation and Debridement to tendon, Left Foot;  Surgeon: Evelina Bucy, DPM;  Location: [place];  Service: Podiatry;  Laterality: Right;   IRRIGATION AND DEBRIDEMENT FOOT Left 11/01/2018   Procedure: IRRIGATION AND DEBRIDEMENT PARTIAL WOUND CLOSURE LOCAL TISSUE TRANSFER AND FLAP ROTATION, LEFT FOOT;  Surgeon: Evelina Bucy, DPM;  Location: [place];  Service: Podiatry;  Laterality: Left;   TRANSMETATARSAL AMPUTATION N/A 08/18/2018   Procedure: IRRIGATION AND DEBRIDEMENT OF LEFT 5TH TOE AND TRANSMETATARSAL, WITH PARTICAL LEFT 5TH TOE AND METATARSAL AMPUTATION, BONE BIOPSY, WOUND VAC APPLICATION.;  Surgeon: Evelina Bucy, DPM;  Location: [place];  Service: Podiatry;  Laterality: N/A;   UPPER EXTREMITY VENOGRAPHY N/A 11/16/2016   Procedure: Upper Extremity Venography - Right Central;  Surgeon: Elam Dutch, MD;  Location: [place] [facility];  Service: Cardiovascular;  Laterality: N/A;   UPPER EXTREMITY VENOGRAPHY N/A 05/25/2018   Procedure: UPPER EXTREMITY VENOGRAPHY - Bilateral;  Surgeon: Marty Heck, MD;  Location: [place] [facility];  Service: Cardiovascular;  Laterality: N/A;    There were no vitals filed for this visit.    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 Yes    Pain Score 6     Pain Location Leg    Pain Orientation Left    Pain Descriptors / Indicators Aching;Throbbing;Other (Comment)   phantom pain   Pain Type Neuropathic pain    Pain Onset More than a month ago    Pain Frequency Constant                [facility] PT Assessment - 07/23/21 0001       Assessment   Medical Diagnosis s/p Lt AKA; ESRD    Referring Provider (PT) Juluis Mire, NP    Onset Date/Surgical Date --   Jan. 2022 for Lt AKA  Precautions   Precautions Fall      Balance Screen   Has the patient fallen in the past 6 months Yes    How many times? 2    Has the patient had a decrease in activity level because of a fear of falling?  Yes    Is the patient reluctant to leave their home because of a fear of falling?  No   unable to leave home due to no ramp available - uses ambulance for transportation     [place] residence    Type of [place] to enter    Entrance Stairs-Number of Steps [place] One level      Prior Function   Level of [place] Needs assistance with ADLs;Needs assistance with homemaking;Other (comment);Needs assistance with transfers   pt is nonambulatory at this time                       Objective measurements completed on examination: See above findings.             LMN for power wheelchair to be completed - Josh Cadle, ATP with [facility] present for eval                Plan - 07/23/21 1755     Clinical Impression Statement Pt evaluated for power wheelchair with vendor, Josh Cadle, ATP with [facility]; recommend power wheelchair with power tilt for independence with mobility and with performing adequate pressure  relief    Personal Factors and Comorbidities Time since onset of injury/illness/exacerbation;Transportation;Past/Current Experience;Behavior Pattern    Examination-Activity Limitations Locomotion Level;Transfers;Stand;Stairs;Squat;Toileting    Examination-Participation Restrictions Laundry;Shop;Meal Prep;Cleaning    Stability/Clinical Decision Making Evolving/Moderate complexity    Clinical Decision Making Moderate    PT Frequency One time visit    Recommended Other Services power wheelchair eval with Josh Cadle, ATP with [facility] and Agree with Plan of Care Patient             Patient will benefit from skilled therapeutic intervention in order to improve the following deficits and impairments:  Difficulty walking, Decreased balance, Decreased strength  Visit Diagnosis: Other abnormalities of gait and mobility - Plan: PT plan of care cert/re-cert  Muscle weakness (generalized) - Plan: PT plan of care cert/re-cert  Unsteadiness on feet - Plan: PT plan of care cert/re-cert     Problem List Patient Active Problem List   Diagnosis Date Noted   Cellulitis of right leg 05/19/2021   Severe comorbid illness    Pressure injury of skin 10/15/2020   Wound infection 10/14/2020   Wound dehiscence    Adjustment disorder, unspecified 09/21/2020   Malnutrition of moderate degree 09/13/2020   Osteomyelitis of left foot ([place]) 09/11/2020   Volume overload 07/22/2020   Hypocalcemia 06/07/2020   Diabetic ulcer of heel ([place]) 05/31/2020   Type 2 diabetes mellitus with foot ulcer ([place]) 04/23/2020   COVID-19 02/20/2020   Personal history of COVID-19 02/20/2020   Diabetic ulcer of left midfoot associated with type 1 diabetes mellitus, with fat layer exposed ([place])    Charcot's joint of foot, left    Pneumonia due to COVID-19 virus 02/14/2020   Cellulitis of left foot 01/31/2020   Allergy, unspecified, initial encounter 07/17/2019   Diarrhea 04/24/2019   Bone erosion  determined by x-ray    Encephalopathy acute    Diabetic  ulcer of left midfoot associated with diabetes mellitus due to underlying condition, with necrosis of bone ([place])    Open wound of left foot    Encounter for orthopedic aftercare following surgical amputation    Foot infection    Gangrene of toe of left foot ([place])    Cellulitis 10/26/2018   Diabetic ulcer of left midfoot associated with diabetes mellitus due to underlying condition, with bone involvement without evidence of necrosis ([name])    Nausea vomiting and diarrhea 10/20/2018   DM (diabetes mellitus), secondary, uncontrolled, with neurologic complications    Headache 09/23/2018   Acute osteomyelitis involving ankle and foot, left ([name])    Anemia of chronic disease 08/18/2018   Encounter for immunization 06/29/2018   Hyperkalemia    Unspecified protein-calorie malnutrition ([place]) 02/14/2018   Encounter for removal of sutures 02/07/2018   Fluid overload, unspecified 01/22/2018   Osteomyelitis of ankle or foot, acute, right ([place]) 10/30/2017   Cellulitis and abscess of toe of left foot    Diabetic foot infection ([place])    Type 2 diabetes mellitus with complication, without long-term current use of insulin ([place]) 10/25/2017   Diabetic gastroparesis ([place]) 10/25/2017   Sepsis ([place]) 10/25/2017   Fever, unspecified 10/28/2016   Iron deficiency anemia, unspecified 10/28/2016   Other specified coagulation defects ([place]) 10/28/2016   Pruritus, unspecified 10/28/2016   Secondary hyperparathyroidism of renal origin ([place]) 10/28/2016   Shortness of breath 10/28/2016   HTN (hypertension) 08/09/2014   ESRD on dialysis ([place]) 65/79/0383   Metabolic acidosis 33/83/2919    Rapheal Masso, [place], PT 07/23/2021, 6:04 PM  [facility] [address] [place], [place], [address] Phone: [phone]   Fax:  [phone]  Name: [name] MRN: [id number] Date of Birth: October 01, 1980

## 2021-07-24 ENCOUNTER — Other Ambulatory Visit (INDEPENDENT_AMBULATORY_CARE_PROVIDER_SITE_OTHER): Payer: Self-pay | Admitting: Primary Care

## 2021-07-24 DIAGNOSIS — I10 Essential (primary) hypertension: Secondary | ICD-10-CM | POA: Diagnosis not present

## 2021-07-24 DIAGNOSIS — Z992 Dependence on renal dialysis: Secondary | ICD-10-CM

## 2021-07-24 DIAGNOSIS — N186 End stage renal disease: Secondary | ICD-10-CM

## 2021-07-24 NOTE — Telephone Encounter (Signed)
Patient called in is out of test strips, he is  requesting short supply unitil refill comes in.[facility] VERIO test strip  [facility], [address] Phone:  [phone]  Fax:  [phone]    he is  requesting short supply unitil refill comes in.

## 2021-07-25 DIAGNOSIS — D689 Coagulation defect, unspecified: Secondary | ICD-10-CM | POA: Diagnosis not present

## 2021-07-25 DIAGNOSIS — R4182 Altered mental status, unspecified: Secondary | ICD-10-CM | POA: Diagnosis not present

## 2021-07-25 DIAGNOSIS — T8249XA Other complication of vascular dialysis catheter, initial encounter: Secondary | ICD-10-CM | POA: Diagnosis not present

## 2021-07-25 DIAGNOSIS — N2581 Secondary hyperparathyroidism of renal origin: Secondary | ICD-10-CM | POA: Diagnosis not present

## 2021-07-25 DIAGNOSIS — Z992 Dependence on renal dialysis: Secondary | ICD-10-CM | POA: Diagnosis not present

## 2021-07-25 DIAGNOSIS — N186 End stage renal disease: Secondary | ICD-10-CM | POA: Diagnosis not present

## 2021-07-25 DIAGNOSIS — R531 Weakness: Secondary | ICD-10-CM | POA: Diagnosis not present

## 2021-07-25 DIAGNOSIS — Z743 Need for continuous supervision: Secondary | ICD-10-CM | POA: Diagnosis not present

## 2021-07-25 DIAGNOSIS — Z7401 Bed confinement status: Secondary | ICD-10-CM | POA: Diagnosis not present

## 2021-07-25 DIAGNOSIS — I10 Essential (primary) hypertension: Secondary | ICD-10-CM | POA: Diagnosis not present

## 2021-07-26 DIAGNOSIS — I10 Essential (primary) hypertension: Secondary | ICD-10-CM | POA: Diagnosis not present

## 2021-07-27 DIAGNOSIS — I10 Essential (primary) hypertension: Secondary | ICD-10-CM | POA: Diagnosis not present

## 2021-07-28 DIAGNOSIS — I10 Essential (primary) hypertension: Secondary | ICD-10-CM | POA: Diagnosis not present

## 2021-07-28 DIAGNOSIS — N186 End stage renal disease: Secondary | ICD-10-CM | POA: Diagnosis not present

## 2021-07-28 DIAGNOSIS — R531 Weakness: Secondary | ICD-10-CM | POA: Diagnosis not present

## 2021-07-28 DIAGNOSIS — Z992 Dependence on renal dialysis: Secondary | ICD-10-CM | POA: Diagnosis not present

## 2021-07-28 DIAGNOSIS — I129 Hypertensive chronic kidney disease with stage 1 through stage 4 chronic kidney disease, or unspecified chronic kidney disease: Secondary | ICD-10-CM | POA: Diagnosis not present

## 2021-07-28 DIAGNOSIS — Z7401 Bed confinement status: Secondary | ICD-10-CM | POA: Diagnosis not present

## 2021-07-28 DIAGNOSIS — D689 Coagulation defect, unspecified: Secondary | ICD-10-CM | POA: Diagnosis not present

## 2021-07-28 DIAGNOSIS — N2581 Secondary hyperparathyroidism of renal origin: Secondary | ICD-10-CM | POA: Diagnosis not present

## 2021-07-28 DIAGNOSIS — T8249XA Other complication of vascular dialysis catheter, initial encounter: Secondary | ICD-10-CM | POA: Diagnosis not present

## 2021-07-29 DIAGNOSIS — I10 Essential (primary) hypertension: Secondary | ICD-10-CM | POA: Diagnosis not present

## 2021-07-30 DIAGNOSIS — Z992 Dependence on renal dialysis: Secondary | ICD-10-CM | POA: Diagnosis not present

## 2021-07-30 DIAGNOSIS — T8249XA Other complication of vascular dialysis catheter, initial encounter: Secondary | ICD-10-CM | POA: Diagnosis not present

## 2021-07-30 DIAGNOSIS — E11621 Type 2 diabetes mellitus with foot ulcer: Secondary | ICD-10-CM | POA: Diagnosis not present

## 2021-07-30 DIAGNOSIS — D689 Coagulation defect, unspecified: Secondary | ICD-10-CM | POA: Diagnosis not present

## 2021-07-30 DIAGNOSIS — Z7401 Bed confinement status: Secondary | ICD-10-CM | POA: Diagnosis not present

## 2021-07-30 DIAGNOSIS — R531 Weakness: Secondary | ICD-10-CM | POA: Diagnosis not present

## 2021-07-30 DIAGNOSIS — N186 End stage renal disease: Secondary | ICD-10-CM | POA: Diagnosis not present

## 2021-07-30 DIAGNOSIS — I10 Essential (primary) hypertension: Secondary | ICD-10-CM | POA: Diagnosis not present

## 2021-07-30 DIAGNOSIS — N2581 Secondary hyperparathyroidism of renal origin: Secondary | ICD-10-CM | POA: Diagnosis not present

## 2021-07-31 ENCOUNTER — Telehealth (INDEPENDENT_AMBULATORY_CARE_PROVIDER_SITE_OTHER): Payer: Self-pay | Admitting: Primary Care

## 2021-07-31 DIAGNOSIS — Z992 Dependence on renal dialysis: Secondary | ICD-10-CM | POA: Diagnosis not present

## 2021-07-31 DIAGNOSIS — E11621 Type 2 diabetes mellitus with foot ulcer: Secondary | ICD-10-CM | POA: Diagnosis not present

## 2021-07-31 DIAGNOSIS — D689 Coagulation defect, unspecified: Secondary | ICD-10-CM | POA: Diagnosis not present

## 2021-07-31 DIAGNOSIS — Z7401 Bed confinement status: Secondary | ICD-10-CM | POA: Diagnosis not present

## 2021-07-31 DIAGNOSIS — S8780XA Crushing injury of unspecified lower leg, initial encounter: Secondary | ICD-10-CM | POA: Diagnosis not present

## 2021-07-31 DIAGNOSIS — R531 Weakness: Secondary | ICD-10-CM | POA: Diagnosis not present

## 2021-07-31 DIAGNOSIS — N2581 Secondary hyperparathyroidism of renal origin: Secondary | ICD-10-CM | POA: Diagnosis not present

## 2021-07-31 DIAGNOSIS — T8249XA Other complication of vascular dialysis catheter, initial encounter: Secondary | ICD-10-CM | POA: Diagnosis not present

## 2021-07-31 DIAGNOSIS — Z743 Need for continuous supervision: Secondary | ICD-10-CM | POA: Diagnosis not present

## 2021-07-31 DIAGNOSIS — I10 Essential (primary) hypertension: Secondary | ICD-10-CM | POA: Diagnosis not present

## 2021-07-31 DIAGNOSIS — N186 End stage renal disease: Secondary | ICD-10-CM | POA: Diagnosis not present

## 2021-07-31 NOTE — Telephone Encounter (Signed)
Patient requesting to speak with PCP directly today regarding his prosthetic leg and power wheel chair. [facility] # [phone]

## 2021-07-31 NOTE — Telephone Encounter (Signed)
Patient would like to discuss his frustrations about not being able to get prosthetic leg and power wheel chair at the same time. He would like to get the process started on getting the prosthetic. He is aware PCP is out of office for remainder of day and message will be routed to her.

## 2021-07-31 NOTE — Telephone Encounter (Signed)
Medication Refill - Medication: insulin aspart (NOVOLOG) 100 UNIT/ML injection  Has the patient contacted their pharmacy? Yes.    (Agent: If yes, when and what did the pharmacy advise?) Pharmacy advised patient today request would be sent in   Preferred Pharmacy (with phone number or street name): Yes and   [facility], [place] Phone:  [phone]  Fax:  [phone]      Has the patient been seen for an appointment in the last year OR does the patient have an upcoming appointment? Yes.    Agent: Please be advised that RX refills may take up to 3 business days. We ask that you follow-up with your pharmacy.

## 2021-07-31 NOTE — Telephone Encounter (Signed)
Unable to select reorder due to diagnosis needs to be selected, routing to provider for refill.

## 2021-08-01 ENCOUNTER — Other Ambulatory Visit (INDEPENDENT_AMBULATORY_CARE_PROVIDER_SITE_OTHER): Payer: Self-pay | Admitting: Primary Care

## 2021-08-01 DIAGNOSIS — E118 Type 2 diabetes mellitus with unspecified complications: Secondary | ICD-10-CM

## 2021-08-01 DIAGNOSIS — R531 Weakness: Secondary | ICD-10-CM | POA: Diagnosis not present

## 2021-08-01 DIAGNOSIS — N2581 Secondary hyperparathyroidism of renal origin: Secondary | ICD-10-CM | POA: Diagnosis not present

## 2021-08-01 DIAGNOSIS — I10 Essential (primary) hypertension: Secondary | ICD-10-CM | POA: Diagnosis not present

## 2021-08-01 DIAGNOSIS — N186 End stage renal disease: Secondary | ICD-10-CM | POA: Diagnosis not present

## 2021-08-01 DIAGNOSIS — E11621 Type 2 diabetes mellitus with foot ulcer: Secondary | ICD-10-CM | POA: Diagnosis not present

## 2021-08-01 DIAGNOSIS — Z7401 Bed confinement status: Secondary | ICD-10-CM | POA: Diagnosis not present

## 2021-08-01 DIAGNOSIS — T8249XA Other complication of vascular dialysis catheter, initial encounter: Secondary | ICD-10-CM | POA: Diagnosis not present

## 2021-08-01 DIAGNOSIS — Z992 Dependence on renal dialysis: Secondary | ICD-10-CM | POA: Diagnosis not present

## 2021-08-01 DIAGNOSIS — E1051 Type 1 diabetes mellitus with diabetic peripheral angiopathy without gangrene: Secondary | ICD-10-CM

## 2021-08-01 DIAGNOSIS — D689 Coagulation defect, unspecified: Secondary | ICD-10-CM | POA: Diagnosis not present

## 2021-08-01 DIAGNOSIS — Z743 Need for continuous supervision: Secondary | ICD-10-CM | POA: Diagnosis not present

## 2021-08-01 MED ORDER — INSULIN ASPART 100 UNIT/ML IJ SOLN: 10.0000 [IU] | Freq: Three times a day (TID) | INTRAMUSCULAR | 3 refills | Status: DC

## 2021-08-01 NOTE — Telephone Encounter (Signed)
Called patient he needed to vent . He made valid points he has change his attitude trying to be positive . Fitted for prosthetic leg but was told insurance would not pay for the power wheel chair and prosthetic leg. He was so upset- taking someone to a candy store let them fill bag up with candy then take it away we were just kidding. He is angry , frustrated, crying on the phone. Reminded look how far you come just a set back don not let any one steal your joy. Leave a message and we can talk but between patients or when available he agreed and felt a little better.

## 2021-08-02 DIAGNOSIS — I10 Essential (primary) hypertension: Secondary | ICD-10-CM | POA: Diagnosis not present

## 2021-08-03 DIAGNOSIS — I10 Essential (primary) hypertension: Secondary | ICD-10-CM | POA: Diagnosis not present

## 2021-08-04 DIAGNOSIS — Z992 Dependence on renal dialysis: Secondary | ICD-10-CM | POA: Diagnosis not present

## 2021-08-04 DIAGNOSIS — Z7401 Bed confinement status: Secondary | ICD-10-CM | POA: Diagnosis not present

## 2021-08-04 DIAGNOSIS — T8249XA Other complication of vascular dialysis catheter, initial encounter: Secondary | ICD-10-CM | POA: Diagnosis not present

## 2021-08-04 DIAGNOSIS — D631 Anemia in chronic kidney disease: Secondary | ICD-10-CM | POA: Diagnosis not present

## 2021-08-04 DIAGNOSIS — R279 Unspecified lack of coordination: Secondary | ICD-10-CM | POA: Diagnosis not present

## 2021-08-04 DIAGNOSIS — Z743 Need for continuous supervision: Secondary | ICD-10-CM | POA: Diagnosis not present

## 2021-08-04 DIAGNOSIS — I10 Essential (primary) hypertension: Secondary | ICD-10-CM | POA: Diagnosis not present

## 2021-08-04 DIAGNOSIS — D689 Coagulation defect, unspecified: Secondary | ICD-10-CM | POA: Diagnosis not present

## 2021-08-04 DIAGNOSIS — N186 End stage renal disease: Secondary | ICD-10-CM | POA: Diagnosis not present

## 2021-08-04 DIAGNOSIS — N2581 Secondary hyperparathyroidism of renal origin: Secondary | ICD-10-CM | POA: Diagnosis not present

## 2021-08-04 DIAGNOSIS — R531 Weakness: Secondary | ICD-10-CM | POA: Diagnosis not present

## 2021-08-05 DIAGNOSIS — I10 Essential (primary) hypertension: Secondary | ICD-10-CM | POA: Diagnosis not present

## 2021-08-06 DIAGNOSIS — Z743 Need for continuous supervision: Secondary | ICD-10-CM | POA: Diagnosis not present

## 2021-08-06 DIAGNOSIS — Z992 Dependence on renal dialysis: Secondary | ICD-10-CM | POA: Diagnosis not present

## 2021-08-06 DIAGNOSIS — N2581 Secondary hyperparathyroidism of renal origin: Secondary | ICD-10-CM | POA: Diagnosis not present

## 2021-08-06 DIAGNOSIS — Z7401 Bed confinement status: Secondary | ICD-10-CM | POA: Diagnosis not present

## 2021-08-06 DIAGNOSIS — N186 End stage renal disease: Secondary | ICD-10-CM | POA: Diagnosis not present

## 2021-08-06 DIAGNOSIS — T8249XA Other complication of vascular dialysis catheter, initial encounter: Secondary | ICD-10-CM | POA: Diagnosis not present

## 2021-08-06 DIAGNOSIS — I10 Essential (primary) hypertension: Secondary | ICD-10-CM | POA: Diagnosis not present

## 2021-08-06 DIAGNOSIS — D689 Coagulation defect, unspecified: Secondary | ICD-10-CM | POA: Diagnosis not present

## 2021-08-06 DIAGNOSIS — D631 Anemia in chronic kidney disease: Secondary | ICD-10-CM | POA: Diagnosis not present

## 2021-08-06 DIAGNOSIS — R531 Weakness: Secondary | ICD-10-CM | POA: Diagnosis not present

## 2021-08-07 DIAGNOSIS — I10 Essential (primary) hypertension: Secondary | ICD-10-CM | POA: Diagnosis not present

## 2021-08-08 ENCOUNTER — Emergency Department (HOSPITAL_COMMUNITY): Payer: Medicare HMO

## 2021-08-08 ENCOUNTER — Inpatient Hospital Stay (HOSPITAL_COMMUNITY)
Admission: EM | Admit: 2021-08-08 | Discharge: 2021-08-11 | DRG: 637 | Disposition: A | Discharge status: Home / Self Care | Payer: Medicare HMO | Attending: Internal Medicine | Admitting: Internal Medicine

## 2021-08-08 ENCOUNTER — Other Ambulatory Visit: Payer: Self-pay

## 2021-08-08 DIAGNOSIS — E1110 Type 2 diabetes mellitus with ketoacidosis without coma: Secondary | ICD-10-CM | POA: Diagnosis not present

## 2021-08-08 DIAGNOSIS — I10 Essential (primary) hypertension: Secondary | ICD-10-CM | POA: Diagnosis not present

## 2021-08-08 DIAGNOSIS — D631 Anemia in chronic kidney disease: Secondary | ICD-10-CM | POA: Diagnosis present

## 2021-08-08 DIAGNOSIS — T8249XA Other complication of vascular dialysis catheter, initial encounter: Secondary | ICD-10-CM | POA: Diagnosis not present

## 2021-08-08 DIAGNOSIS — S0990XA Unspecified injury of head, initial encounter: Secondary | ICD-10-CM | POA: Diagnosis not present

## 2021-08-08 DIAGNOSIS — E108 Type 1 diabetes mellitus with unspecified complications: Secondary | ICD-10-CM | POA: Diagnosis present

## 2021-08-08 DIAGNOSIS — Z992 Dependence on renal dialysis: Secondary | ICD-10-CM | POA: Diagnosis not present

## 2021-08-08 DIAGNOSIS — Z743 Need for continuous supervision: Secondary | ICD-10-CM | POA: Diagnosis not present

## 2021-08-08 DIAGNOSIS — I517 Cardiomegaly: Secondary | ICD-10-CM | POA: Diagnosis not present

## 2021-08-08 DIAGNOSIS — E875 Hyperkalemia: Secondary | ICD-10-CM | POA: Diagnosis present

## 2021-08-08 DIAGNOSIS — E1010 Type 1 diabetes mellitus with ketoacidosis without coma: Principal | ICD-10-CM | POA: Diagnosis present

## 2021-08-08 DIAGNOSIS — Z794 Long term (current) use of insulin: Secondary | ICD-10-CM

## 2021-08-08 DIAGNOSIS — Z7401 Bed confinement status: Secondary | ICD-10-CM | POA: Diagnosis not present

## 2021-08-08 DIAGNOSIS — G4489 Other headache syndrome: Secondary | ICD-10-CM | POA: Diagnosis not present

## 2021-08-08 DIAGNOSIS — R531 Weakness: Secondary | ICD-10-CM | POA: Diagnosis not present

## 2021-08-08 DIAGNOSIS — Z8616 Personal history of COVID-19: Secondary | ICD-10-CM

## 2021-08-08 DIAGNOSIS — E872 Acidosis, unspecified: Secondary | ICD-10-CM | POA: Diagnosis present

## 2021-08-08 DIAGNOSIS — D689 Coagulation defect, unspecified: Secondary | ICD-10-CM | POA: Diagnosis not present

## 2021-08-08 DIAGNOSIS — N186 End stage renal disease: Secondary | ICD-10-CM

## 2021-08-08 DIAGNOSIS — N2581 Secondary hyperparathyroidism of renal origin: Secondary | ICD-10-CM | POA: Diagnosis present

## 2021-08-08 DIAGNOSIS — E1022 Type 1 diabetes mellitus with diabetic chronic kidney disease: Secondary | ICD-10-CM | POA: Diagnosis present

## 2021-08-08 DIAGNOSIS — E1043 Type 1 diabetes mellitus with diabetic autonomic (poly)neuropathy: Secondary | ICD-10-CM | POA: Diagnosis present

## 2021-08-08 DIAGNOSIS — R52 Pain, unspecified: Secondary | ICD-10-CM | POA: Diagnosis not present

## 2021-08-08 DIAGNOSIS — S91302A Unspecified open wound, left foot, initial encounter: Secondary | ICD-10-CM | POA: Diagnosis not present

## 2021-08-08 DIAGNOSIS — M542 Cervicalgia: Secondary | ICD-10-CM | POA: Diagnosis not present

## 2021-08-08 DIAGNOSIS — I120 Hypertensive chronic kidney disease with stage 5 chronic kidney disease or end stage renal disease: Secondary | ICD-10-CM | POA: Diagnosis present

## 2021-08-08 DIAGNOSIS — Z833 Family history of diabetes mellitus: Secondary | ICD-10-CM

## 2021-08-08 DIAGNOSIS — M546 Pain in thoracic spine: Secondary | ICD-10-CM | POA: Diagnosis not present

## 2021-08-08 DIAGNOSIS — M549 Dorsalgia, unspecified: Secondary | ICD-10-CM | POA: Diagnosis not present

## 2021-08-08 DIAGNOSIS — E109 Type 1 diabetes mellitus without complications: Secondary | ICD-10-CM | POA: Diagnosis present

## 2021-08-08 DIAGNOSIS — E1143 Type 2 diabetes mellitus with diabetic autonomic (poly)neuropathy: Secondary | ICD-10-CM | POA: Diagnosis present

## 2021-08-08 DIAGNOSIS — Z79899 Other long term (current) drug therapy: Secondary | ICD-10-CM

## 2021-08-08 DIAGNOSIS — K3184 Gastroparesis: Secondary | ICD-10-CM | POA: Diagnosis present

## 2021-08-08 DIAGNOSIS — W19XXXA Unspecified fall, initial encounter: Secondary | ICD-10-CM

## 2021-08-08 DIAGNOSIS — Z89612 Acquired absence of left leg above knee: Secondary | ICD-10-CM

## 2021-08-08 DIAGNOSIS — E118 Type 2 diabetes mellitus with unspecified complications: Secondary | ICD-10-CM | POA: Diagnosis present

## 2021-08-08 DIAGNOSIS — Z4781 Encounter for orthopedic aftercare following surgical amputation: Secondary | ICD-10-CM | POA: Diagnosis not present

## 2021-08-08 HISTORY — DX: Cellulitis of left toe: L02.612

## 2021-08-08 HISTORY — DX: Diabetes mellitus due to underlying condition with foot ulcer: L97.424

## 2021-08-08 HISTORY — DX: Cutaneous abscess of left foot: L03.032

## 2021-08-08 HISTORY — DX: Diabetes mellitus due to underlying condition with foot ulcer: E08.621

## 2021-08-08 LAB — CBC WITH DIFFERENTIAL/PLATELET
Abs Immature Granulocytes: 0.01 10*3/uL (ref 0.00–0.07)
Basophils Absolute: 0.1 10*3/uL (ref 0.0–0.1)
Basophils Relative: 1 %
Eosinophils Absolute: 0.4 10*3/uL (ref 0.0–0.5)
Eosinophils Relative: 9 %
HCT: 43.9 % (ref 39.0–52.0)
Hemoglobin: 13.7 g/dL (ref 13.0–17.0)
Immature Granulocytes: 0 %
Lymphocytes Relative: 18 %
Lymphs Abs: 0.8 10*3/uL (ref 0.7–4.0)
MCH: 28.5 pg (ref 26.0–34.0)
MCHC: 31.2 g/dL (ref 30.0–36.0)
MCV: 91.5 fL (ref 80.0–100.0)
Monocytes Absolute: 0.4 10*3/uL (ref 0.1–1.0)
Monocytes Relative: 9 %
Neutro Abs: 2.7 10*3/uL (ref 1.7–7.7)
Neutrophils Relative %: 63 %
Platelets: 105 10*3/uL — ABNORMAL LOW (ref 150–400)
RBC: 4.8 MIL/uL (ref 4.22–5.81)
RDW: 15.2 % (ref 11.5–15.5)
WBC: 4.3 10*3/uL (ref 4.0–10.5)
nRBC: 0 % (ref 0.0–0.2)

## 2021-08-08 LAB — BASIC METABOLIC PANEL
Anion gap: 14 (ref 5–15)
BUN: 48 mg/dL — ABNORMAL HIGH (ref 6–20)
CO2: 26 mmol/L (ref 22–32)
Calcium: 8.8 mg/dL — ABNORMAL LOW (ref 8.9–10.3)
Chloride: 90 mmol/L — ABNORMAL LOW (ref 98–111)
Creatinine, Ser: 6.72 mg/dL — ABNORMAL HIGH (ref 0.61–1.24)
GFR, Estimated: 10 mL/min — ABNORMAL LOW (ref >60)
Glucose, Bld: 242 mg/dL — ABNORMAL HIGH (ref 70–99)
Potassium: 4.8 mmol/L (ref 3.5–5.1)
Sodium: 130 mmol/L — ABNORMAL LOW (ref 135–145)

## 2021-08-08 NOTE — ED Triage Notes (Signed)
Arrived via [facility]; reported patient was dropped by staff earlier while transferring from w/c to stretcher and hit head and back. Stated he is still having headache and back pain, and only able to complete 1 out of 4 hours dialysis. Patient endorsed this is the 4th occurrence of this event.

## 2021-08-08 NOTE — ED Provider Notes (Signed)
Emergency Medicine Provider Triage Evaluation Note  [name] , a 40 y.o. male  was evaluated in triage.  Pt complains of  neck pain and back pain that occurred earlier this morning.  Reports that he was being transported via PH[facility] to dialysis when they dropped him.  Patient states that he hit his head in the process.  He states that he began having a mild headache as well as neck and back pain.  He was only able to complete 1 hour of dialysis secondary to the pain he was then and was brought here for further evaluation.  He states that he feels fluid overloaded.  He last completed a full course of dialysis on Wednesday.  Review of Systems  Positive: + headache, neck pain, back pain, SOB Negative: - LOC  Physical Exam  BP (!) 145/103 (BP Location: Right Arm)   Pulse 88   Temp 98.6 F (37 C) (Oral)   Resp 16   SpO2 97%  Gen:   Awake, no distress   Resp:  Normal effort  MSK:   Moves extremities without difficulty  Other:  + midline C spine and T spine TTP. Neuro intact. NO signs of head injury.   Medical Decision Making  Medically screening exam initiated at 3:34 PM.  Appropriate orders placed.  [name] was informed that the remainder of the evaluation will be completed by another provider, this initial triage assessment does not replace that evaluation, and the importance of remaining in the ED until their evaluation is complete.     Eustaquio Maize, PA-C 08/08/21 1537    Charlesetta Shanks, MD 08/09/21 [phone]

## 2021-08-09 DIAGNOSIS — W19XXXA Unspecified fall, initial encounter: Secondary | ICD-10-CM | POA: Diagnosis not present

## 2021-08-09 DIAGNOSIS — E1110 Type 2 diabetes mellitus with ketoacidosis without coma: Secondary | ICD-10-CM | POA: Diagnosis present

## 2021-08-09 DIAGNOSIS — Z992 Dependence on renal dialysis: Secondary | ICD-10-CM | POA: Diagnosis not present

## 2021-08-09 DIAGNOSIS — N250 Renal osteodystrophy: Secondary | ICD-10-CM | POA: Diagnosis not present

## 2021-08-09 DIAGNOSIS — N186 End stage renal disease: Secondary | ICD-10-CM | POA: Diagnosis not present

## 2021-08-09 DIAGNOSIS — E875 Hyperkalemia: Secondary | ICD-10-CM | POA: Diagnosis not present

## 2021-08-09 DIAGNOSIS — D631 Anemia in chronic kidney disease: Secondary | ICD-10-CM | POA: Diagnosis not present

## 2021-08-09 DIAGNOSIS — I120 Hypertensive chronic kidney disease with stage 5 chronic kidney disease or end stage renal disease: Secondary | ICD-10-CM | POA: Diagnosis not present

## 2021-08-09 LAB — RENAL FUNCTION PANEL
Albumin: 3.7 g/dL (ref 3.5–5.0)
Anion gap: 15 (ref 5–15)
BUN: 67 mg/dL — ABNORMAL HIGH (ref 6–20)
CO2: 19 mmol/L — ABNORMAL LOW (ref 22–32)
Calcium: 9.2 mg/dL (ref 8.9–10.3)
Chloride: 95 mmol/L — ABNORMAL LOW (ref 98–111)
Creatinine, Ser: 8.93 mg/dL — ABNORMAL HIGH (ref 0.61–1.24)
GFR, Estimated: 7 mL/min — ABNORMAL LOW (ref >60)
Glucose, Bld: 73 mg/dL (ref 70–99)
Phosphorus: 7.4 mg/dL — ABNORMAL HIGH (ref 2.5–4.6)
Potassium: 5.1 mmol/L (ref 3.5–5.1)
Sodium: 129 mmol/L — ABNORMAL LOW (ref 135–145)

## 2021-08-09 LAB — GLUCOSE, CAPILLARY
Glucose-Capillary: 64 mg/dL — ABNORMAL LOW (ref 70–99)
Glucose-Capillary: 81 mg/dL (ref 70–99)
Glucose-Capillary: 212 mg/dL — ABNORMAL HIGH (ref 70–99)
Glucose-Capillary: 293 mg/dL — ABNORMAL HIGH (ref 70–99)
Glucose-Capillary: 224 mg/dL — ABNORMAL HIGH (ref 70–99)

## 2021-08-09 LAB — CBC
HCT: 45.2 % (ref 39.0–52.0)
Hemoglobin: 14.4 g/dL (ref 13.0–17.0)
MCH: 28.7 pg (ref 26.0–34.0)
MCHC: 31.9 g/dL (ref 30.0–36.0)
MCV: 90.2 fL (ref 80.0–100.0)
Platelets: 118 10*3/uL — ABNORMAL LOW (ref 150–400)
RBC: 5.01 MIL/uL (ref 4.22–5.81)
RDW: 15.1 % (ref 11.5–15.5)
WBC: 5.4 10*3/uL (ref 4.0–10.5)
nRBC: 0 % (ref 0.0–0.2)

## 2021-08-09 LAB — I-STAT VENOUS BLOOD GAS, ED
Acid-base deficit: 6 mmol/L — ABNORMAL HIGH (ref 0.0–2.0)
Bicarbonate: 20.7 mmol/L (ref 20.0–28.0)
Calcium, Ion: 0.95 mmol/L — ABNORMAL LOW (ref 1.15–1.40)
HCT: 43 % (ref 39.0–52.0)
Hemoglobin: 14.6 g/dL (ref 13.0–17.0)
O2 Saturation: 89 %
Potassium: >8.5 mmol/L (ref 3.5–5.1)
Sodium: 118 mmol/L — CL (ref 135–145)
TCO2: 22 mmol/L (ref 22–32)
pCO2, Ven: 43.4 mmHg — ABNORMAL LOW (ref 44.0–60.0)
pH, Ven: 7.286 (ref 7.250–7.430)
pO2, Ven: 64 mmHg — ABNORMAL HIGH (ref 32.0–45.0)

## 2021-08-09 LAB — BASIC METABOLIC PANEL
Anion gap: 19 — ABNORMAL HIGH (ref 5–15)
Anion gap: 20 — ABNORMAL HIGH (ref 5–15)
Anion gap: 18 — ABNORMAL HIGH (ref 5–15)
BUN: 64 mg/dL — ABNORMAL HIGH (ref 6–20)
BUN: 65 mg/dL — ABNORMAL HIGH (ref 6–20)
BUN: 67 mg/dL — ABNORMAL HIGH (ref 6–20)
CO2: 17 mmol/L — ABNORMAL LOW (ref 22–32)
CO2: 19 mmol/L — ABNORMAL LOW (ref 22–32)
CO2: 20 mmol/L — ABNORMAL LOW (ref 22–32)
Calcium: 8.8 mg/dL — ABNORMAL LOW (ref 8.9–10.3)
Calcium: 9.8 mg/dL (ref 8.9–10.3)
Calcium: 9.3 mg/dL (ref 8.9–10.3)
Chloride: 86 mmol/L — ABNORMAL LOW (ref 98–111)
Chloride: 88 mmol/L — ABNORMAL LOW (ref 98–111)
Chloride: 93 mmol/L — ABNORMAL LOW (ref 98–111)
Creatinine, Ser: 8.53 mg/dL — ABNORMAL HIGH (ref 0.61–1.24)
Creatinine, Ser: 8.69 mg/dL — ABNORMAL HIGH (ref 0.61–1.24)
Creatinine, Ser: 9.2 mg/dL — ABNORMAL HIGH (ref 0.61–1.24)
GFR, Estimated: 7 mL/min — ABNORMAL LOW (ref >60)
GFR, Estimated: 7 mL/min — ABNORMAL LOW (ref >60)
GFR, Estimated: 7 mL/min — ABNORMAL LOW (ref >60)
Glucose, Bld: 804 mg/dL (ref 70–99)
Glucose, Bld: 331 mg/dL — ABNORMAL HIGH (ref 70–99)
Glucose, Bld: 76 mg/dL (ref 70–99)
Potassium: 5.6 mmol/L — ABNORMAL HIGH (ref 3.5–5.1)
Potassium: 5.3 mmol/L — ABNORMAL HIGH (ref 3.5–5.1)
Potassium: 5.1 mmol/L (ref 3.5–5.1)
Sodium: 122 mmol/L — ABNORMAL LOW (ref 135–145)
Sodium: 127 mmol/L — ABNORMAL LOW (ref 135–145)
Sodium: 131 mmol/L — ABNORMAL LOW (ref 135–145)

## 2021-08-09 LAB — HEMOGLOBIN A1C
Hgb A1c MFr Bld: 9.3 % — ABNORMAL HIGH (ref 4.8–5.6)
Mean Plasma Glucose: 220.21 mg/dL

## 2021-08-09 LAB — CBG MONITORING, ED
Glucose-Capillary: >600 mg/dL (ref 70–99)
Glucose-Capillary: 585 mg/dL (ref 70–99)
Glucose-Capillary: 352 mg/dL — ABNORMAL HIGH (ref 70–99)
Glucose-Capillary: 294 mg/dL — ABNORMAL HIGH (ref 70–99)
Glucose-Capillary: 142 mg/dL — ABNORMAL HIGH (ref 70–99)
Glucose-Capillary: 173 mg/dL — ABNORMAL HIGH (ref 70–99)
Glucose-Capillary: 92 mg/dL (ref 70–99)

## 2021-08-09 LAB — BETA-HYDROXYBUTYRIC ACID
Beta-Hydroxybutyric Acid: 0.39 mmol/L — ABNORMAL HIGH (ref 0.05–0.27)
Beta-Hydroxybutyric Acid: 0.07 mmol/L (ref 0.05–0.27)
Beta-Hydroxybutyric Acid: 0.06 mmol/L (ref 0.05–0.27)

## 2021-08-09 LAB — OSMOLALITY: Osmolality: 311 mOsm/kg — ABNORMAL HIGH (ref 275–295)

## 2021-08-09 LAB — RESP PANEL BY RT-PCR (FLU A&B, COVID) ARPGX2
Influenza A by PCR: NEGATIVE
Influenza B by PCR: NEGATIVE
SARS Coronavirus 2 by RT PCR: NEGATIVE

## 2021-08-09 LAB — PHOSPHORUS: Phosphorus: 6.8 mg/dL — ABNORMAL HIGH (ref 2.5–4.6)

## 2021-08-09 LAB — MRSA NEXT GEN BY PCR, NASAL: MRSA by PCR Next Gen: NOT DETECTED

## 2021-08-09 LAB — MAGNESIUM: Magnesium: 3 mg/dL — ABNORMAL HIGH (ref 1.7–2.4)

## 2021-08-09 LAB — HIV ANTIBODY (ROUTINE TESTING W REFLEX): HIV Screen 4th Generation wRfx: NONREACTIVE

## 2021-08-09 LAB — HEPATITIS B SURFACE ANTIGEN: Hepatitis B Surface Ag: NONREACTIVE

## 2021-08-09 MED ORDER — ACETAMINOPHEN 650 MG RE SUPP: 650.0000 mg | Freq: Four times a day (QID) | RECTAL | Status: DC | PRN

## 2021-08-09 MED ORDER — ONDANSETRON HCL 4 MG PO TABS: 4.0000 mg | ORAL_TABLET | Freq: Four times a day (QID) | ORAL | Status: DC | PRN

## 2021-08-09 MED ORDER — ACETAMINOPHEN 325 MG PO TABS
650.0000 mg | ORAL_TABLET | Freq: Four times a day (QID) | ORAL | Status: DC | PRN
  Administered 2021-08-10: 650 mg via ORAL
  Filled 2021-08-09: qty 2

## 2021-08-09 MED ORDER — HEPARIN SODIUM (PORCINE) 5000 UNIT/ML IJ SOLN
5000.0000 [IU] | Freq: Three times a day (TID) | INTRAMUSCULAR | Status: DC
  Administered 2021-08-09 – 2021-08-11 (×3): 5000 [IU] via SUBCUTANEOUS
  Filled 2021-08-09 (×5): qty 1

## 2021-08-09 MED ORDER — INSULIN REGULAR(HUMAN) IN NACL 100-0.9 UT/100ML-% IV SOLN
INTRAVENOUS | Status: DC
  Filled 2021-08-09: qty 100

## 2021-08-09 MED ORDER — LACTATED RINGERS IV SOLN: INTRAVENOUS | Status: DC

## 2021-08-09 MED ORDER — DEXTROSE IN LACTATED RINGERS 5 % IV SOLN: INTRAVENOUS | Status: DC

## 2021-08-09 MED ORDER — OXYCODONE-ACETAMINOPHEN 5-325 MG PO TABS
1.0000 | ORAL_TABLET | Freq: Once | ORAL | Status: AC
  Administered 2021-08-09: 1 via ORAL
  Filled 2021-08-09: qty 1

## 2021-08-09 MED ORDER — DEXTROSE 5 % IV SOLN: INTRAVENOUS | Status: DC

## 2021-08-09 MED ORDER — INSULIN ASPART 100 UNIT/ML IJ SOLN
12.0000 [IU] | Freq: Once | INTRAMUSCULAR | Status: AC
  Administered 2021-08-09: 12 [IU] via SUBCUTANEOUS

## 2021-08-09 MED ORDER — CHLORHEXIDINE GLUCONATE CLOTH 2 % EX PADS: 6.0000 | MEDICATED_PAD | Freq: Every day | CUTANEOUS | Status: DC

## 2021-08-09 MED ORDER — ONDANSETRON HCL 4 MG/2ML IJ SOLN: 4.0000 mg | Freq: Four times a day (QID) | INTRAMUSCULAR | Status: DC | PRN

## 2021-08-09 MED ORDER — SEVELAMER CARBONATE 800 MG PO TABS
2400.0000 mg | ORAL_TABLET | ORAL | Status: DC
  Administered 2021-08-10 – 2021-08-11 (×2): 2400 mg via ORAL
  Filled 2021-08-09 (×5): qty 3

## 2021-08-09 MED ORDER — SEVELAMER CARBONATE 800 MG PO TABS
4000.0000 mg | ORAL_TABLET | Freq: Three times a day (TID) | ORAL | Status: DC
  Administered 2021-08-09 – 2021-08-11 (×4): 4000 mg via ORAL
  Filled 2021-08-09 (×3): qty 5

## 2021-08-09 MED ORDER — DEXTROSE 50 % IV SOLN
0.0000 mL | INTRAVENOUS | Status: DC | PRN
  Administered 2021-08-10: 50 mL via INTRAVENOUS
  Filled 2021-08-09: qty 50

## 2021-08-09 MED ORDER — HYDROMORPHONE HCL 1 MG/ML IJ SOLN
0.5000 mg | Freq: Once | INTRAMUSCULAR | Status: AC
  Administered 2021-08-10: 0.5 mg via INTRAVENOUS
  Filled 2021-08-09: qty 0.5

## 2021-08-09 MED ORDER — DEXTROSE 50 % IV SOLN: 0.0000 mL | INTRAVENOUS | Status: DC | PRN

## 2021-08-09 MED ORDER — INSULIN REGULAR(HUMAN) IN NACL 100-0.9 UT/100ML-% IV SOLN
INTRAVENOUS | Status: DC
  Administered 2021-08-09: 5.5 [IU]/h via INTRAVENOUS
  Administered 2021-08-09: 3.6 [IU]/h via INTRAVENOUS
  Administered 2021-08-10: 1.4 [IU]/h via INTRAVENOUS

## 2021-08-09 MED ORDER — LACTATED RINGERS IV BOLUS: 20.0000 mL/kg | Freq: Once | INTRAVENOUS | Status: DC

## 2021-08-09 NOTE — ED Notes (Signed)
Patient with incont episode. Provided paper scrub pants and peri-care performed by patient

## 2021-08-09 NOTE — Consult Note (Addendum)
[facility] Renal Consultation Note    Indication for Consultation:  Management of ESRD/hemodialysis, anemia, hypertension/volume, and secondary hyperparathyroidism.  HPI: [name] is a 40 y.o. male with PMH including ESRD on dialysis, diabetes mellitus, diabetic gastroparesis, L AKA, who reported to the ED yesterday after a fall. Patient reports transportation was transporting him for dialysis and he fell and hit his head on the concrete. He went to HD anyway but was only able to complete 1 hr 2mn before his pain became more severe and he decided to go to the ED to rule out an intracranial injury. CT head was unremarkable. Unfortunately he missed his PM insulin while waiting in the ER and blood glucose was 804. It has now come down to 294 with subcutaneous insulin. Labs were also notable for Na 122 (in setting of hyperglycemia) and K+ 5.6. Repeat K+ on venous blood gas was >8.5 approx. 3 hours later, but likely error as repeat BMP showed K+ 5.3.  CXR was notable for stable cardiomegaly with vascular congestion and trace bilateral pleural effusions. Nephrology has been consulted for management of ESRD.  On exam, patient reports he has a headache. He feels volume overloaded and "swollen" but denies SOB and O2 saturation is 100% on room air. Denies CP, palpitations, dizziness, abdominal pain, N/V/D. Denies new extremity pain or weakness. No recent fevers or chills. He becomes tearful when taking about the fall and is distressed over his lack of independence since his amputation.   Past Medical History:  Diagnosis Date   Anemia    Diabetes mellitus without complication ([place]    Di[place]roparesis ([place]    Dialysis patient ([facility]    Hypertension    Renal disorder    Dialysis   Sepsis ([facility]    Past Surgical History:  Procedure Laterality Date   AMPUTATION Right 02/02/2018   Procedure: RIGHT FIFTH TOE AND METATARSAL AMPUTATION. Filetted toe flap metatarsal resection.  Debridement Plantar Foot wound;  Surgeon: PEvelina Bucy DPM;  [place]  Service: Podiatry;  Laterality: Right;   AMPUTATION Left 08/20/2018   Procedure: FIFTH METATARSAL BONE BIOPSY;  Surgeon: PEvelina Bucy DPM;  [place]  Service: Podiatry;  Laterality: Left;   AMPUTATION Left 10/28/2018   Procedure: LEFT GREAT TOE AMPUTATION;  Surgeon: PEvelina Bucy DPM;  [place]  Service: Podiatry;  Laterality: Left;   AMPUTATION Left 09/16/2020   Procedure: AMPUTATION BELOW KNEE;  Surgeon: ERosetta Posner MD;  [place]  Service: Vascular;  Laterality: Left;   AMPUTATION Left 10/15/2020   Procedure: AMPUTATION ABOVE KNEE;  Surgeon: FElam Dutch MD;  [place]  Service: Vascular;  Laterality: Left;   APPLICATION OF WOUND VAC  02/02/2018   Procedure: APPLICATION OF WOUND VAC  Right Foot;  Surgeon: PEvelina Bucy DPM;  [place]  Service: Podiatry;;   APPLICATION OF WOUND VAC Left 10/28/2018   Procedure: APPLICATION OF WOUND VAC LEFT TOE;  Surgeon: PEvelina Bucy DPM;  [place]  Service: Podiatry;  Laterality: Left;   APPLICATION OF WOUND VAC Left 11/01/2018   Procedure: APPLICATION OF WOUND VAC;  Surgeon: PEvelina Bucy DPM;  [place]  Service: Podiatry;  Laterality: Left;   AV FISTULA PLACEMENT     left arm.   AV FISTULA PLACEMENT Right 12/22/2016   Procedure: INSERTION OF ARTERIOVENOUS (AV) GORE-TEX GRAFT ARM;  Surgeon: CElam Dutch MD;  [place]  Service: Vascular;  Laterality: Right;   AV FISTULA PLACEMENT Left 05/26/2018   Procedure: INSERTION OF  ARTERIOVENOUS (AV) GORE-TEX GRAFT LEFT ARM;  Surgeon: Serafina Mitchell, MD;  Location: [place];  Service: Vascular;  Laterality: Left;   EYE SURGERY     I & D EXTREMITY Right 10/31/2017   Procedure: IRRIGATION AND DEBRIDEMENT RIGHT FOOT;  Surgeon: Evelina Bucy, DPM;  Location: [place];  Service: Podiatry;  Laterality: Right;   I & D EXTREMITY Left 08/20/2018    Procedure: IRRIGATION AND DEBRIDEMENT EXTREMITY WITH SECONDARY WOUND CLOSUREAND APPLICATION OF WOUND VAC LEFT FOOT;  Surgeon: Evelina Bucy, DPM;  Location: ;  Service: Podiatry;  Laterality: Left;   I & D EXTREMITY Left 10/20/2018   Procedure: IRRIGATION AND DEBRIDEMENT LEFT FOOT  DEBRIDEMENT LATERAL FOOT WOUND;  Surgeon: Evelina Bucy, DPM;  Location: [place];  Service: Podiatry;  Laterality: Left;   I & D EXTREMITY Left 10/28/2018   Procedure: IRRIGATION AND DEBRIDEMENT LEFT TOE;  Surgeon: Evelina Bucy, DPM;  Location: [place];  Service: Podiatry;  Laterality: Left;   I & D EXTREMITY Left 09/14/2020   Procedure: IRRIGATION AND DEBRIDEMENT WRIST;  Surgeon: Leanora Cover, MD;  Location: [place];  Service: Orthopedics;  Laterality: Left;   INSERTION OF DIALYSIS CATHETER     Right subclavian   IR AV DIALY SHUNT INTRO NEEDLE/INTRACATH INITIAL W/PTA/IMG RIGHT Right 02/05/2018   IR FLUORO GUIDE CV LINE RIGHT  01/31/2020   IR THROMBECTOMY AV FISTULA W/THROMBOLYSIS/PTA INC/SHUNT/IMG LEFT Left 08/24/2018   IR THROMBECTOMY AV FISTULA W/THROMBOLYSIS/PTA INC/SHUNT/IMG LEFT Left 01/06/2019   IR US GUIDE VASC ACCESS LEFT  08/24/2018   IR US GUIDE VASC ACCESS RIGHT  02/05/2018   IR US GUIDE VASC ACCESS RIGHT  01/31/2020   IRRIGATION AND DEBRIDEMENT FOOT Right 10/23/2018   Procedure: Irrigation and Debridement to tendon, Left Foot;  Surgeon: Evelina Bucy, DPM;  Location: [place];  Service: Podiatry;  Laterality: Right;   IRRIGATION AND DEBRIDEMENT FOOT Left 11/01/2018   Procedure: IRRIGATION AND DEBRIDEMENT PARTIAL WOUND CLOSURE LOCAL TISSUE TRANSFER AND FLAP ROTATION, LEFT FOOT;  Surgeon: Evelina Bucy, DPM;  Location: [place];  Service: Podiatry;  Laterality: Left;   TRANSMETATARSAL AMPUTATION N/A 08/18/2018   Procedure: IRRIGATION AND DEBRIDEMENT OF LEFT 5TH TOE AND TRANSMETATARSAL, WITH PARTICAL LEFT 5TH TOE AND METATARSAL AMPUTATION, BONE BIOPSY, WOUND VAC APPLICATION.;  Surgeon: Evelina Bucy, DPM;   Location: [place];  Service: Podiatry;  Laterality: N/A;   UPPER EXTREMITY VENOGRAPHY N/A 11/16/2016   Procedure: Upper Extremity Venography - Right Central;  Surgeon: Elam Dutch, MD;  Location: [place] [facility];  Service: Cardiovascular;  Laterality: N/A;   UPPER EXTREMITY VENOGRAPHY N/A 05/25/2018   Procedure: UPPER EXTREMITY VENOGRAPHY - Bilateral;  Surgeon: Marty Heck, MD;  Location: [place] [facility];  Service: Cardiovascular;  Laterality: N/A;   Family History  Problem Relation Age of Onset   Diabetes Mellitus II Other    Diabetes Father    Renal Disease Father        ESRD   Social History:  reports that he has never smoked. He has never used smokeless tobacco. He reports that he does not drink alcohol and does not use drugs.  ROS: As per HPI otherwise negative.  Review of Systems: Gen: Denies any fever, chills, fatigue, weakness, malaise, weight loss, and/or sleep disorders. HEENT: Denies blurred vision, sore throat or epistaxis. No sinusitis.   CV: Denies chest pain, angina, palpitations,  orthopnea, PND, peripheral edema, and claudication. Resp: Denies dyspnea at rest, dyspnea with exercise, cough, sputum, or wheezing. GI: Denies nausea, vomiting, abdominal pain, constipation or diarrhea. GU: Denies dysuria or hematuria. MS: Denies joint pain, muscle pain or cramps.  Derm: Denies rashes, itching, or ulcerations. Psych: Denies depression and anxiety. Heme: Denies bruising, bleeding, and enlarged lymph nodes. Neuro: No headache, dizziness, or weakness. Endocrine: No hypoglycemia or thyroid disease.  Physical Exam: Vitals:   08/09/21 1130 08/09/21 1200 08/09/21 1245 08/09/21 1300  BP: (!) 153/93  (!) 130/98 (!) 157/110  Pulse: 89  78 88  Resp: _0 Temp:      TempSrc:      SpO2: 96%  99% 100%  Weight:  87.2 kg       General: Well developed, well nourished, in no acute distress. Head: Normocephalic, atraumatic, sclera non-icteric, mucus membranes  are moist. Lungs: Clear bilaterally to auscultation without wheezes, rales, or rhonchi. Decreased breath sounds in bases. Breathing is unlabored on RA Heart: RRR with normal S1, S2. No murmurs, rubs, or gallops appreciated. Abdomen: Soft, non-tender, non-distended with normoactive bowel sounds. No rebound/guarding. No obvious abdominal masses. Lower extremities: L stump and RLE with trace edema Neuro: Alert and oriented X 3. Moves all extremities spontaneously. Psych:  Responds to questions appropriately with a normal affect. Dialysis Access: R IJ TDC with dry, intact bandage  No Known Allergies Prior to Admission medications   Medication Sig Start Date End Date Taking? Authorizing Provider  Blood Glucose Monitoring Suppl ([place]) w/Device KIT 1 Bag by Does not apply route 3 (three) times daily before meals. 05/02/20   Kerin Perna, NP  calcitRIOL (ROCALTROL) 0.5 MCG capsule Take 3 capsules (1.5 mcg total) by mouth every Monday, Wednesday, and Friday with hemodialysis. 05/23/21   Hosie Poisson, MD  cinacalcet (SENSIPAR) 60 MG tablet Take 120 mg by mouth daily after supper. 04/24/20   [provider]  gabapentin (NEURONTIN) 100 MG capsule Take 1 capsule (100 mg total) by mouth 3 (three) times daily. 07/15/21   Kerin Perna, NP  glucose blood (ONETOUCH VERIO) test strip USE STRIPS TO CHECK GLUCOSE THREE TIMES DAILY 07/24/21   Kerin Perna, NP  insulin aspart (NOVOLOG) 100 UNIT/ML injection Inject 10 Units into the skin 3 (three) times daily before meals. For blood sugars 0-150 give 0 units of insulin, 151-200 give 2 units of insulin, 201-250 give 4 units, 251-300 give 6 units, 301-350 give 8 units, 351-400 give 10 units,> 400 give 12 units and call M.D. Discussed hypoglycemia protocol. 08/01/21   Kerin Perna, NP  Lancets [facility] DELICA PLUS [id number]) MISC Apply topically. 05/02/20   [provider]  metroNIDAZOLE (FLAGYL) 500 MG tablet  Take 1 tablet (500 mg total) by mouth every 12 (twelve) hours. 05/23/21   Hosie Poisson, MD  multivitamin (RENA-VIT) TABS tablet Take 1 tablet by mouth at bedtime. 05/23/21   Hosie Poisson, MD  nutrition supplement, JUVEN, (JUVEN) PACK Take 1 packet by mouth 2 (two) times daily between meals. 05/23/21 09/20/21  Hosie Poisson, MD  Nutritional Supplements (,FEEDING SUPPLEMENT, PROSOURCE PLUS) liquid Take 30 mLs by mouth 3 (three) times daily between meals. 05/23/21 08/21/21  Hosie Poisson, MD  sevelamer carbonate (RENVELA) 800 MG tablet Take 2,400-4,000 mg by mouth See admin instructions. Take 4,000 mg (5 tablets) by mouth with meals and 2,400 mg (3 tablets) with snacks 04/27/19   [provider]   Current Facility-Administered Medications  Medication  Dose Route Frequency Provider Last Rate Last Admin   acetaminophen (TYLENOL) tablet 650 mg  650 mg Oral Q6H PRN Marianna Payment, MD       Or   acetaminophen (TYLENOL) suppository 650 mg  650 mg Rectal Q6H PRN Marianna Payment, MD       dextrose 50 % solution 0-50 mL  0-50 mL Intravenous PRN Marianna Payment, MD       heparin injection 5,000 Units  5,000 Units Subcutaneous Q8H Marianna Payment, MD       insulin regular, human (MYXREDLIN) 100 units/ 100 mL infusion   Intravenous Continuous Marianna Payment, MD       ondansetron [facility]) tablet 4 mg  4 mg Oral Q6H PRN Marianna Payment, MD       Or   ondansetron [facility]) injection 4 mg  4 mg Intravenous Q6H PRN Marianna Payment, MD       Current Outpatient Medications  Medication Sig Dispense Refill   Blood Glucose Monitoring Suppl ([place] [employer] [facility]) w/Device KIT 1 Bag by Does not apply route 3 (three) times daily before meals. 1 kit 0   calcitRIOL (ROCALTROL) 0.5 MCG capsule Take 3 capsules (1.5 mcg total) by mouth every Monday, Wednesday, and Friday with hemodialysis.     cinacalcet (SENSIPAR) 60 MG tablet Take 120 mg by mouth daily after supper.     gabapentin (NEURONTIN) 100 MG capsule Take 1 capsule  (100 mg total) by mouth 3 (three) times daily. 90 capsule 1   glucose blood (ONETOUCH VERIO) test strip USE STRIPS TO CHECK GLUCOSE THREE TIMES DAILY 100 each 3   insulin aspart (NOVOLOG) 100 UNIT/ML injection Inject 10 Units into the skin 3 (three) times daily before meals. For blood sugars 0-150 give 0 units of insulin, 151-200 give 2 units of insulin, 201-250 give 4 units, 251-300 give 6 units, 301-350 give 8 units, 351-400 give 10 units,> 400 give 12 units and call M.D. Discussed hypoglycemia protocol. 10 mL 3   Lancets (ONETOUCH DELICA PLUS [id number]) MISC Apply topically.     metroNIDAZOLE (FLAGYL) 500 MG tablet Take 1 tablet (500 mg total) by mouth every 12 (twelve) hours. 14 tablet 0   multivitamin (RENA-VIT) TABS tablet Take 1 tablet by mouth at bedtime.  0   nutrition supplement, JUVEN, (JUVEN) PACK Take 1 packet by mouth 2 (two) times daily between meals. 60 packet 3   Nutritional Supplements (,FEEDING SUPPLEMENT, PROSOURCE PLUS) liquid Take 30 mLs by mouth 3 (three) times daily between meals. 2700 mL 2   sevelamer carbonate (RENVELA) 800 MG tablet Take 2,400-4,000 mg by mouth See admin instructions. Take 4,000 mg (5 tablets) by mouth with meals and 2,400 mg (3 tablets) with snacks     Labs: Basic Metabolic Panel: Recent Labs  Lab 08/08/21 1541 08/09/21 0852 08/09/21 1114  NA 130* 122* 118*  K 4.8 5.6* >8.5*  CL 90* 86*  --   CO2 26 17*  --   GLUCOSE 242* 804*  --   BUN 48* 64*  --   CREATININE 6.72* 8.53*  --   CALCIUM 8.8* 8.8*  --    Liver Function Tests: No results for input(s): AST, ALT, ALKPHOS, BILITOT, PROT, ALBUMIN in the last 168 hours. No results for input(s): LIPASE, AMYLASE in the last 168 hours. No results for input(s): AMMONIA in the last 168 hours. CBC: Recent Labs  Lab 08/08/21 1541 08/09/21 1114 08/09/21 1153  WBC 4.3  --  5.4  NEUTROABS 2.7  --   --  HGB 13.7 14.6 14.4  HCT 43.9 43.0 45.2  MCV 91.5  --  90.2  PLT 105*  --  118*   Cardiac  Enzymes: No results for input(s): CKTOTAL, CKMB, CKMBINDEX, TROPONINI in the last 168 hours. CBG: Recent Labs  Lab 08/09/21 0726 08/09/21 1013 08/09/21 1204 08/09/21 1305  GLUCAP >600* 585* 352* 294*   Iron Studies: No results for input(s): IRON, TIBC, TRANSFERRIN, FERRITIN in the last 72 hours. Studies/Results: DG Chest 2 View  Result Date: 08/08/2021 CLINICAL DATA:  Concern for fluid overload. Pain with inspiration. Technologist notes state dropped down stairs this morning while being transported to dialysis. Thoracic back pain. EXAM: CHEST - 2 VIEW COMPARISON:  Chest radiograph 07/28/2020 FINDINGS: Right-sided dialysis catheter remains in place. Cardiomegaly is unchanged. Mediastinal contours are unchanged. Mild vascular congestion without overt edema. Suspect trace bilateral pleural effusions. No focal airspace disease. No pneumothorax. Left axillary vascular stent. No acute osseous abnormalities are seen. Thoracic spine assessed on dedicated thoracic spine radiographs performed concurrently. IMPRESSION: Stable cardiomegaly with vascular congestion. Suspect trace bilateral pleural effusions. Electronically Signed   By: Keith Rake M.D.   On: 08/08/2021 16:42   DG Thoracic Spine 2 View  Result Date: 08/08/2021 CLINICAL DATA:  Patient reports thoracic back pain after being dropped down some stairs this morning while being transported to dialysis. EXAM: THORACIC SPINE 2 VIEWS COMPARISON:  None. FINDINGS: No evidence of acute fracture or compression deformity. Normal alignment. Vertebral body heights are preserved, upper aspect of the thoracic spine is partially obscured on the lateral view. No evidence of focal bone abnormality. No paravertebral soft tissue abnormality. IMPRESSION: Negative radiographs of the thoracic spine. Electronically Signed   By: Keith Rake M.D.   On: 08/08/2021 16:44   CT HEAD WO CONTRAST (5MM)  Result Date: 08/08/2021 CLINICAL DATA:  fall, head injury  EXAM: CT HEAD WITHOUT CONTRAST TECHNIQUE: Contiguous axial images were obtained from the base of the skull through the vertex without intravenous contrast. COMPARISON:  05/30/2020. FINDINGS: Brain: No evidence of acute infarction, hemorrhage, hydrocephalus, extra-axial collection or mass lesion/mass effect. Vascular: No hyperdense vessel identified. Skull: No acute fracture. Sinuses/Orbits: Partially imaged inferior left maxillary sinus mucosal thickening. Otherwise, clear sinuses. Unremarkable orbits. Other: No mastoid effusions. IMPRESSION: No evidence of acute intracranial abnormality. Electronically Signed   By: Margaretha Sheffield M.D.   On: 08/08/2021 16:38   CT Cervical Spine Wo Contrast  Result Date: 08/08/2021 CLINICAL DATA:  Provided history: Fall, injury. Additional history provided: Fall today (hitting back of head). Patient reports headache, neck pain, dizziness. EXAM: CT CERVICAL SPINE WITHOUT CONTRAST TECHNIQUE: Multidetector CT imaging of the cervical spine was performed without intravenous contrast. Multiplanar CT image reconstructions were also generated. COMPARISON:  CT cervical spine 05/30/2020. FINDINGS: Alignment: Reversal of mild reversal of the expected cervical lordosis. No significant spondylolisthesis. Skull base and vertebrae: The basion-dental and atlanto-dental intervals are maintained.No evidence of acute fracture to the cervical spine. 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 Soft tissues and spinal canal: No prevertebral fluid or swelling. No visible canal hematoma. Disc levels: No significant bony spinal canal or neural foraminal narrowing. Upper chest: No consolidation within the imaged lung apices. No visible pneumothorax. Other: Partially imaged right-sided dialysis catheter. Age-advanced arterial vascular calcifications. IMPRESSION: No evidence of acute fracture to the cervical spine. Mild reversal of the expected cervical lordosis.  Electronically Signed   By: Kellie Simmering D.O.   On: 08/08/2021 16:44    Dialysis Orders:  Center:  [facility]  on MWF. 180NRe, 4 hours 60mn, BFR 400, DFR A1.5, EDW 75kg (last weight was 92.1kg), 2K, 2Ca, TDC, no heparin Calcitriol 1.591m PO q HD  Assessment/Plan:  Fall: Reports fall while being transported in his wheelchair. CT head and cervical spine unremarkable. Management per primary team.  Hyperglycemia: Missed his PM insulin yesterday, blood glucose initially >800 but has come down to 294 with subcutaneous insulin. Insulin drip per primary team.  Hyperkalemia: Mild, In setting of hyperglycemia and ESRD. Repeat K+ was 5.3. Planning for HD tonight (will need to be run as a separate case if on insulin drip).   ESRD:  Dialysis on MWF, had abbreviated treatment Friday due to pain from his fall. Planning for extra HD today.   Hypertension/volume: Vascular congestion on CXR but O2 saturations are good on room air. Significantly over his EDW, unclear what his true dry weight should be at this point. Tolerates UF goals of 5.5L outpatient.   Anemia: Hgb at goal, not on ESA.   Metabolic bone disease: Last calcium level ok, phos 6.8. Resume binders once eating.   Nutrition:  Albumin pending, changed diet to renal/carb modified with fluid restrictions.   SaAnice PaganiniPA-C 08/09/2021, 1:40 PM  [facility] Pager: [phone]

## 2021-08-09 NOTE — ED Provider Notes (Signed)
I provided a substantive portion of the care of this patient.  I personally performed the entirety of the history for this encounter.     Patient has ESRD on dialysis.  He was being transported and reports that as a crew was carrying his wheelchair down the stairs, they ended up dropping him landing on his back and striking his head.  Patient reports he had a lot of pain in his back after the event.  He was transported to dialysis but due to pain could not complete the 4-hour session and was taken off at 1 hour and transported to the emergency department for further evaluation.  Patient is alert.  Mental status is clear.  No respiratory distress.  Heart regular.  Abdomen soft and nontender.  Normal visual inspection of the back.  No contusions or abrasions.  Patient endorses discomfort diffusely over the thoracic and lumbar spine.  No step-offs or areas of soft tissue swelling.  Patient has complex medical history with diabetes and kidney failure.  At this time, physical exam does not suggest neurologic injury or likely fracture.  Imaging within normal limits however patient's CBG significantly elevated.  Due to significant derangement in electrolytes and findings consistent with DKA.  Plan for admission.  Patient's mental status is clear.  I agree with plan of management.   Charlesetta Shanks, MD 08/14/21 [phone]

## 2021-08-09 NOTE — H&P (Addendum)
Date: 08/09/2021               Patient Name:  [name] MRN: [id number]  DOB: July 31, 1981 Age / Sex: 40 y.o., male   PCP: Kerin Perna, NP         Medical Service: Internal Medicine Teaching Service         Attending Physician: Dr. Evette Doffing, Mallie Mussel, *    First Contact: France Ravens, MD Pager: [phone]  Second Contact: Gaylan Gerold, DO Pager: Liliane Shi [phone]       After Hours (After 5p/  First Contact Pager: [phone]  weekends / holidays): Second Contact Pager: [phone]   SUBJECTIVE  Chief Complaint: Hyperglycemia  History of Present Illness: AYLAN BAYONA is a 40 y.o. male with a pertinent PMH of ESRD, type 2 diabetes, diabetic gastroparesis, hypertension, anemia of chronic disease,, who presents to [facility] after a fall with elevated glucoses levels..  Patient states that he was on his way to hemodialysis via [facility] when he experienced a fall when being assisted from his house to his transportation.  He fell and hit his head and back.  Has not experienced a headache, neck pain, back pain.  He decided to try to go to hemodialysis despite this pain.  After 1 hour of HD, his symptoms progressed and he was transported to [facility] for further evaluation management.  Unfortunately, the patient's evaluation was delayed by 15 hours and as result was not able to take his normal insulin as scheduled..  As result, his blood sugars were elevated in the 800s.  He denies any significant symptoms including fevers, chills, abdominal pain, nausea, vomiting, chest pain, urinary frequency, dysuria, diarrhea.  He does still endorse a headache, neck pain, and back pain.  Medications: No current facility-administered medications on file prior to encounter.   Current Outpatient Medications on File Prior to Encounter  Medication Sig Dispense Refill   Blood Glucose Monitoring Suppl ([place]) w/Device KIT 1 Bag by Does not apply route 3 (three) times daily before meals. 1 kit 0    calcitRIOL (ROCALTROL) 0.5 MCG capsule Take 3 capsules (1.5 mcg total) by mouth every Monday, Wednesday, and Friday with hemodialysis.     cinacalcet (SENSIPAR) 60 MG tablet Take 120 mg by mouth daily after supper.     gabapentin (NEURONTIN) 100 MG capsule Take 1 capsule (100 mg total) by mouth 3 (three) times daily. 90 capsule 1   glucose blood (ONETOUCH VERIO) test strip USE STRIPS TO CHECK GLUCOSE THREE TIMES DAILY 100 each 3   insulin aspart (NOVOLOG) 100 UNIT/ML injection Inject 10 Units into the skin 3 (three) times daily before meals. For blood sugars 0-150 give 0 units of insulin, 151-200 give 2 units of insulin, 201-250 give 4 units, 251-300 give 6 units, 301-350 give 8 units, 351-400 give 10 units,> 400 give 12 units and call M.D. Discussed hypoglycemia protocol. 10 mL 3   Lancets (ONETOUCH DELICA PLUS [id number]) MISC Apply topically.     metroNIDAZOLE (FLAGYL) 500 MG tablet Take 1 tablet (500 mg total) by mouth every 12 (twelve) hours. 14 tablet 0   multivitamin (RENA-VIT) TABS tablet Take 1 tablet by mouth at bedtime.  0   nutrition supplement, JUVEN, (JUVEN) PACK Take 1 packet by mouth 2 (two) times daily between meals. 60 packet 3   Nutritional Supplements (,FEEDING SUPPLEMENT, PROSOURCE PLUS) liquid Take 30 mLs by mouth 3 (three) times daily between meals. 2700 mL 2   sevelamer  carbonate (RENVELA) 800 MG tablet Take 2,400-4,000 mg by mouth See admin instructions. Take 4,000 mg (5 tablets) by mouth with meals and 2,400 mg (3 tablets) with snacks      Past Medical History: Past Medical History:  Diagnosis Date   Anemia    Diabetes mellitus without complication ([place])    Diabetic gastroparesis ([place])    Dialysis patient ([place])    Hypertension    Renal disorder    Dialysis   Sepsis [facility])     Social:  Lives - [place] Occupation - on disability  Support - family in town Level of function - independent but in a wheel chair.,  PCP -[facility] Substance use -denies substance  use  Family History: Family History  Problem Relation Age of Onset   Diabetes Mellitus II Other    Diabetes Father    Renal Disease Father        ESRD    Allergies: Allergies as of 08/08/2021   (No Known Allergies)    Review of Systems: A complete ROS was negative except as per HPI.   OBJECTIVE:  Physical Exam: Blood pressure (!) 146/91, pulse 82, temperature 98 F (36.7 C), temperature source Oral, resp. rate 13, SpO2 97 %. Physical Exam Constitutional:      Appearance: He is not ill-appearing or diaphoretic.  HENT:     Head: Normocephalic and atraumatic.  Eyes:     Extraocular Movements: Extraocular movements intact.  Cardiovascular:     Rate and Rhythm: Normal rate and regular rhythm.     Pulses: Normal pulses.     Heart sounds: Normal heart sounds.  Pulmonary:     Effort: Pulmonary effort is normal.     Breath sounds: Normal breath sounds.  Abdominal:     General: Abdomen is flat. Bowel sounds are normal. There is no distension.     Tenderness: There is no abdominal tenderness.  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 Normal range of motion.  Skin:    Comments: Patient has multiple dark-colored papules distributed throughout his body.  Additionally has some warmth and mild erythema of his right lower extremity without pain or skin breakdown.  Neurological:     General: No focal deficit present.     Mental Status: He is alert and oriented to person, place, and time.  Psychiatric:        Mood and Affect: Mood is anxious.        Speech: Speech is rapid and pressured.    Pertinent Labs: CBC    Component Value Date/Time   WBC 4.3 08/08/2021 1541   RBC 4.80 08/08/2021 1541   HGB 13.7 08/08/2021 1541   HGB 9.7 (L) 08/22/2014 0447   HCT 43.9 08/08/2021 1541   HCT 30.0 (L) 08/22/2014 0447   PLT 105 (L) 08/08/2021 1541   PLT 158 08/22/2014 0447   MCV 91.5  08/08/2021 1541   MCV 97 08/22/2014 0447   MCH 28.5 08/08/2021 1541   MCHC 31.2 08/08/2021 1541   RDW 15.2 08/08/2021 1541   RDW 15.3 (H) 08/22/2014 0447   LYMPHSABS 0.8 08/08/2021 1541   LYMPHSABS 1.7 08/22/2014 0447   MONOABS 0.4 08/08/2021 1541   MONOABS 0.8 08/22/2014 0447   EOSABS 0.4 08/08/2021 1541   EOSABS 0.4 08/22/2014 0447   BASOSABS 0.1 08/08/2021 1541   BASOSABS 0.1 08/22/2014 0447     CMP  Component Value Date/Time   NA 122 (L) 08/09/2021 0852   NA 138 08/22/2014 0447   K 5.6 (H) 08/09/2021 0852   K 4.0 08/22/2014 0447   CL 86 (L) 08/09/2021 0852   CL 100 08/22/2014 0447   CO2 17 (L) 08/09/2021 0852   CO2 30 08/22/2014 0447   GLUCOSE 804 (HH) 08/09/2021 0852   GLUCOSE 88 08/22/2014 0447   BUN 64 (H) 08/09/2021 0852   BUN 14 08/22/2014 0447   CREATININE 8.53 (H) 08/09/2021 0852   CREATININE 5.00 (H) 08/22/2014 0447   CALCIUM 8.8 (L) 08/09/2021 0852   CALCIUM 8.2 (L) 08/22/2014 0447   PROT 6.6 09/22/2020 1931   PROT 7.2 08/19/2014 2006   ALBUMIN 3.4 (L) 05/21/2021 0856   ALBUMIN 3.8 08/19/2014 2006   AST 12 (L) 09/22/2020 1931   AST 14 (L) 08/19/2014 2006   ALT 19 09/22/2020 1931   ALT 20 08/19/2014 2006   ALKPHOS 194 (H) 09/22/2020 1931   ALKPHOS 121 (H) 08/19/2014 2006   BILITOT 0.6 09/22/2020 1931   BILITOT 0.9 08/19/2014 2006   GFRNONAA 7 (L) 08/09/2021 0852   GFRNONAA 14 (L) 08/22/2014 0447   GFRNONAA 5 (L) 06/19/2014 0937   GFRAA 7 (L) 05/30/2020 1923   GFRAA 17 (L) 08/22/2014 0447   GFRAA 6 (L) 06/19/2014 0937    Pertinent Imaging: DG Chest 2 View  Result Date: 08/08/2021 CLINICAL DATA:  Concern for fluid overload. Pain with inspiration. Technologist notes state dropped down stairs this morning while being transported to dialysis. Thoracic back pain. EXAM: CHEST - 2 VIEW COMPARISON:  Chest radiograph 07/28/2020 FINDINGS: Right-sided dialysis catheter remains in place. Cardiomegaly is unchanged. Mediastinal contours are unchanged. Mild  vascular congestion without overt edema. Suspect trace bilateral pleural effusions. No focal airspace disease. No pneumothorax. Left axillary vascular stent. No acute osseous abnormalities are seen. Thoracic spine assessed on dedicated thoracic spine radiographs performed concurrently. IMPRESSION: Stable cardiomegaly with vascular congestion. Suspect trace bilateral pleural effusions. Electronically Signed   By: Keith Rake M.D.   On: 08/08/2021 16:42   DG Thoracic Spine 2 View  Result Date: 08/08/2021 CLINICAL DATA:  Patient reports thoracic back pain after being dropped down some stairs this morning while being transported to dialysis. EXAM: THORACIC SPINE 2 VIEWS COMPARISON:  None. FINDINGS: No evidence of acute fracture or compression deformity. Normal alignment. Vertebral body heights are preserved, upper aspect of the thoracic spine is partially obscured on the lateral view. No evidence of focal bone abnormality. No paravertebral soft tissue abnormality. IMPRESSION: Negative radiographs of the thoracic spine. Electronically Signed   By: Keith Rake M.D.   On: 08/08/2021 16:44   CT HEAD WO CONTRAST (5MM)  Result Date: 08/08/2021 CLINICAL DATA:  fall, head injury EXAM: CT HEAD WITHOUT CONTRAST TECHNIQUE: Contiguous axial images were obtained from the base of the skull through the vertex without intravenous contrast. COMPARISON:  05/30/2020. FINDINGS: Brain: No evidence of acute infarction, hemorrhage, hydrocephalus, extra-axial collection or mass lesion/mass effect. Vascular: No hyperdense vessel identified. Skull: No acute fracture. Sinuses/Orbits: Partially imaged inferior left maxillary sinus mucosal thickening. Otherwise, clear sinuses. Unremarkable orbits. Other: No mastoid effusions. IMPRESSION: No evidence of acute intracranial abnormality. Electronically Signed   By: Margaretha Sheffield M.D.   On: 08/08/2021 16:38   CT Cervical Spine Wo Contrast  Result Date: 08/08/2021 CLINICAL  DATA:  Provided history: Fall, injury. Additional history provided: Fall today (hitting back of head). Patient reports headache, neck pain, dizziness. EXAM: CT CERVICAL SPINE WITHOUT  CONTRAST TECHNIQUE: Multidetector CT imaging of the cervical spine was performed without intravenous contrast. Multiplanar CT image reconstructions were also generated. COMPARISON:  CT cervical spine 05/30/2020. FINDINGS: Alignment: Reversal of mild reversal of the expected cervical lordosis. No significant spondylolisthesis. Skull base and vertebrae: The basion-dental and atlanto-dental intervals are maintained.No evidence of acute fracture to the cervical spine. 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 Soft tissues and spinal canal: No prevertebral fluid or swelling. No visible canal hematoma. Disc levels: No significant bony spinal canal or neural foraminal narrowing. Upper chest: No consolidation within the imaged lung apices. No visible pneumothorax. Other: Partially imaged right-sided dialysis catheter. Age-advanced arterial vascular calcifications. IMPRESSION: No evidence of acute fracture to the cervical spine. Mild reversal of the expected cervical lordosis. Electronically Signed   By: Kellie Simmering D.O.   On: 08/08/2021 16:44    EKG: pending  ASSESSMENT & PLAN:  Assessment:  DAMAIN BROADUS is a 40 y.o. with pertinent PMH of ESRD, type 2 diabetes, diabetic gastroparesis, hypertension, anemia of chronic disease, who presented after a fall with hyperglycemia and admit for DKA versus HHS on hospital day 0  Plan: #Hyperglycemia crisis secondayr to DKA versus HHS: #Insulin Dependent Type 2 Diabetes Mellitus: AGAMA: Patient presents with hyperglycemic crisis in the setting of missed insulin doses.  He is currently asymptomatic from his hyperglycemia.  Labs pending including beta hydroxybutyrate and serum osmolality.  Serum glucose likely indicating HHS, the patient is insulin-dependent  diabetic..  -Start insulin drip and fluids -Monitor electrolytes -We will start a diet since he has been n.p.o. for greater than 15 hours -Will get Mg and Phos - A1c pending  #Fall: Patient presents after a fall with headache, neck pain, and back pain.  All imaging is reassuring for structural damage.  States that he is having severe pain still.  We will start acetaminophen scheduled. -Tylenol 1000 mg 3 times daily - Lidocaine patch to back   #ESRD: Patient only finished 1 hour of HD on Friday.  He is on Monday Wednesday Friday scheduling.  We will reach out to nephrology for possible inpatient hemodialysis. -Consult nephrology for inpatient hemodialysis  #Hyperkalemia: Patient has critically elevated hyperkalemia in the setting of DKA/HHS as well as a history of ESRD.  He is on insulin drip currently with a repeat BMP pending.  KG was not performed in the ED and patient was not given any calcium gluconate.  -EKG stat -Continue insulin GTT -Repeat BMP pending -Further management depending on results   Best Practice: Diet: Regular diet IVF: Fluids: LR, Rate:  125cc and per endotool VTE: heparin subQ Code: Full AB: none Status: Observation with expected length of stay less than 2 midnights. Anticipated Discharge Location: Home Barriers to Discharge: Medical stability  Signature: Lawerance Cruel, D.O.  Internal Medicine Resident, PGY-3 [name]  Pager: [phone] 11:16 AM, 08/09/2021   Please contact the on call pager after 5 pm and on weekends at [phone].

## 2021-08-09 NOTE — ED Notes (Signed)
Critical Glucose reported by lab   804

## 2021-08-09 NOTE — Progress Notes (Signed)
Patient stated that he knew HD cath stitches are not attached with skin, but he didn't know MD notified or not. Informed patient's RN regarding this matter and make sure HD cath placement is okay. HS [name]

## 2021-08-09 NOTE — ED Notes (Addendum)
Pt provided cup of water

## 2021-08-09 NOTE — ED Notes (Addendum)
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

## 2021-08-09 NOTE — Progress Notes (Signed)
Paged by RN. Patient wanting to discuss with MD about Q1h glucose checks, stated he did not want to be woken up every hour. Discussed with patient at bedside, he is agreeable to continuing with hourly glucose monitoring.  Patient also complaining of pain. 8-9/10. Will give small dose dilaudid for pain due to ESRD.

## 2021-08-09 NOTE — ED Notes (Signed)
Patient upset over wait time and states he needs insulin asap for his blood sugar. Explained the nursing staff couldn't give insulin without a doctors order and he would have to wait until in a treatment room.

## 2021-08-09 NOTE — Progress Notes (Signed)
When pt admitted from ED, his CBG was 64. Pt drank some juice and rechecked CBG 30 mins later was 81. Pt feeling fine.

## 2021-08-09 NOTE — ED Notes (Signed)
Delay in starting fluids/insulin gtt d/t pharmacy d/c and reorder, needed verified etc. RN took supplies in to begin fluid bolus and insulin gtt and pt refused both, stated the insulin gtt would cause him to be too hypoglycemic and fluids would overload him as he only got one hour of dialysis yesterday. RN sent message to Dr Evette Doffing to update on same.

## 2021-08-09 NOTE — ED Provider Notes (Signed)
[facility] EMERGENCY DEPARTMENT Provider Note   CSN: [id number] Arrival date & time: 08/08/21  1523     History Chief Complaint  Patient presents with   Fall    Back pain    Back Pain    [name] is a 40 y.o. male with medical history significant for ESRD, IBS and DMII. Patient states yesterday he was being picked up by [facility] for his dialysis appointment (MWF). Patient states that EMS crew was carrying his wheelchair down stairs when EMS crew dropped him and he landed on his back and head, striking both. Patient denies LOC during this event. Patient states that he was then transported to regularly scheduled dialysis appointment but could not withstand full 4 hour treatment due to pain and had to be pulled off of machine after 1 hour and transported to hospital. Patient endorses headaches and back pain. Patient denies nausea, vomiting, LOC or dizziness.    Fall Associated symptoms include headaches. Pertinent negatives include no chest pain and no shortness of breath.  Back Pain Associated symptoms: headaches   Associated symptoms: no chest pain       Past Medical History:  Diagnosis Date   Anemia    Diabetes mellitus without complication ([place])    Diabetic gastroparesis ([place])    Dialysis patient [facility])    Hypertension    Renal disorder    Dialysis   Sepsis [facility]     Patient Active Problem List   Diagnosis Date Noted   DKA (diabetic ketoacidosis) ([place]) 08/09/2021   Cellulitis of right leg 05/19/2021   Severe comorbid illness    Pressure injury of skin 10/15/2020   Wound infection 10/14/2020   Wound dehiscence    Adjustment disorder, unspecified 09/21/2020   Malnutrition of moderate degree 09/13/2020   Osteomyelitis of left foot ([place]) 09/11/2020   Volume overload 07/22/2020   Hypocalcemia 06/07/2020   Diabetic ulcer of heel ([place]) 05/31/2020   Type 2 diabetes mellitus with foot ulcer ([place]) 04/23/2020   COVID-19 02/20/2020   Personal history  of COVID-19 02/20/2020   Diabetic ulcer of left midfoot associated with type 1 diabetes mellitus, with fat layer exposed ([place])    Charcot's joint of foot, left    Pneumonia due to COVID-19 virus 02/14/2020   Cellulitis of left foot 01/31/2020   Allergy, unspecified, initial encounter 07/17/2019   Diarrhea 04/24/2019   Bone erosion determined by x-ray    Encephalopathy acute    Diabetic ulcer of left midfoot associated with diabetes mellitus due to underlying condition, with necrosis of bone ([place])    Open wound of left foot    Encounter for orthopedic aftercare following surgical amputation    Foot infection    Gangrene of toe of left foot ([place])    Cellulitis 10/26/2018   Diabetic ulcer of left midfoot associated with diabetes mellitus due to underlying condition, with bone involvement without evidence of necrosis ([name])    Nausea vomiting and diarrhea 10/20/2018   DM (diabetes mellitus), secondary, uncontrolled, with neurologic complications    Headache 09/23/2018   Acute osteomyelitis involving ankle and foot, left ([name])    Anemia of chronic disease 08/18/2018   Encounter for immunization 06/29/2018   Hyperkalemia    Unspecified protein-calorie malnutrition ([place]) 02/14/2018   Encounter for removal of sutures 02/07/2018   Fluid overload, unspecified 01/22/2018   Osteomyelitis of ankle or foot, acute, right ([place]) 10/30/2017   Cellulitis and abscess of toe of left foot    Diabetic  foot infection ([place])    Type 2 diabetes mellitus with complication, without long-term current use of insulin ([place]) 10/25/2017   Diabetic gastroparesis ([place]) 10/25/2017   Sepsis ([place]) 10/25/2017   Fever, unspecified 10/28/2016   Iron deficiency anemia, unspecified 10/28/2016   Other specified coagulation defects ([place]) 10/28/2016   Pruritus, unspecified 10/28/2016   Secondary hyperparathyroidism of renal origin ([place]) 10/28/2016   Shortness of breath 10/28/2016   HTN (hypertension) 08/09/2014   ESRD on  dialysis ([place]) 78/67/6720   Metabolic acidosis 94/70/9628    Past Surgical History:  Procedure Laterality Date   AMPUTATION Right 02/02/2018   Procedure: RIGHT FIFTH TOE AND METATARSAL AMPUTATION. Filetted toe flap metatarsal resection. Debridement Plantar Foot wound;  Surgeon: Evelina Bucy, DPM;  Location: [place];  Service: Podiatry;  Laterality: Right;   AMPUTATION Left 08/20/2018   Procedure: FIFTH METATARSAL BONE BIOPSY;  Surgeon: Evelina Bucy, DPM;  Location: [place];  Service: Podiatry;  Laterality: Left;   AMPUTATION Left 10/28/2018   Procedure: LEFT GREAT TOE AMPUTATION;  Surgeon: Evelina Bucy, DPM;  Location: [place];  Service: Podiatry;  Laterality: Left;   AMPUTATION Left 09/16/2020   Procedure: AMPUTATION BELOW KNEE;  Surgeon: Rosetta Posner, MD;  Location: [place];  Service: Vascular;  Laterality: Left;   AMPUTATION Left 10/15/2020   Procedure: AMPUTATION ABOVE KNEE;  Surgeon: Elam Dutch, MD;  Location: [place];  Service: Vascular;  Laterality: Left;   APPLICATION OF WOUND VAC  02/02/2018   Procedure: APPLICATION OF WOUND VAC  Right Foot;  Surgeon: Evelina Bucy, DPM;  Location: [place];  Service: Podiatry;;   APPLICATION OF WOUND VAC Left 10/28/2018   Procedure: APPLICATION OF WOUND VAC LEFT TOE;  Surgeon: Evelina Bucy, DPM;  Location: [place];  Service: Podiatry;  Laterality: Left;   APPLICATION OF WOUND VAC Left 11/01/2018   Procedure: APPLICATION OF WOUND VAC;  Surgeon: Evelina Bucy, DPM;  Location: [place];  Service: Podiatry;  Laterality: Left;   AV FISTULA PLACEMENT     left arm.   AV FISTULA PLACEMENT Right 12/22/2016   Procedure: INSERTION OF ARTERIOVENOUS (AV) GORE-TEX GRAFT ARM;  Surgeon: Elam Dutch, MD;  Location: [place];  Service: Vascular;  Laterality: Right;   AV FISTULA PLACEMENT Left 05/26/2018   Procedure: INSERTION OF  ARTERIOVENOUS (AV) GORE-TEX GRAFT LEFT ARM;  Surgeon: Serafina Mitchell, MD;  Location: [place];  Service: Vascular;  Laterality: Left;    EYE SURGERY     I & D EXTREMITY Right 10/31/2017   Procedure: IRRIGATION AND DEBRIDEMENT RIGHT FOOT;  Surgeon: Evelina Bucy, DPM;  Location: [place];  Service: Podiatry;  Laterality: Right;   I & D EXTREMITY Left 08/20/2018   Procedure: IRRIGATION AND DEBRIDEMENT EXTREMITY WITH SECONDARY WOUND CLOSUREAND APPLICATION OF WOUND VAC LEFT FOOT;  Surgeon: Evelina Bucy, DPM;  Location: [place];  Service: Podiatry;  Laterality: Left;   I & D EXTREMITY Left 10/20/2018   Procedure: IRRIGATION AND DEBRIDEMENT LEFT FOOT  DEBRIDEMENT LATERAL FOOT WOUND;  Surgeon: Evelina Bucy, DPM;  Location: [place];  Service: Podiatry;  Laterality: Left;   I & D EXTREMITY Left 10/28/2018   Procedure: IRRIGATION AND DEBRIDEMENT LEFT TOE;  Surgeon: Evelina Bucy, DPM;  Location: [place];  Service: Podiatry;  Laterality: Left;   I & D EXTREMITY Left 09/14/2020   Procedure: IRRIGATION AND DEBRIDEMENT WRIST;  Surgeon: Leanora Cover, MD;  Location: [place];  Service: Orthopedics;  Laterality:  Left;   INSERTION OF DIALYSIS CATHETER     Right subclavian   IR AV DIALY SHUNT INTRO NEEDLE/INTRACATH INITIAL W/PTA/IMG RIGHT Right 02/05/2018   IR FLUORO GUIDE CV LINE RIGHT  01/31/2020   IR THROMBECTOMY AV FISTULA W/THROMBOLYSIS/PTA INC/SHUNT/IMG LEFT Left 08/24/2018   IR THROMBECTOMY AV FISTULA W/THROMBOLYSIS/PTA INC/SHUNT/IMG LEFT Left 01/06/2019   IR US GUIDE VASC ACCESS LEFT  08/24/2018   IR US GUIDE VASC ACCESS RIGHT  02/05/2018   IR US GUIDE VASC ACCESS RIGHT  01/31/2020   IRRIGATION AND DEBRIDEMENT FOOT Right 10/23/2018   Procedure: Irrigation and Debridement to tendon, Left Foot;  Surgeon: Evelina Bucy, DPM;  Location: [place];  Service: Podiatry;  Laterality: Right;   IRRIGATION AND DEBRIDEMENT FOOT Left 11/01/2018   Procedure: IRRIGATION AND DEBRIDEMENT PARTIAL WOUND CLOSURE LOCAL TISSUE TRANSFER AND FLAP ROTATION, LEFT FOOT;  Surgeon: Evelina Bucy, DPM;  Location: [place];  Service: Podiatry;  Laterality: Left;    TRANSMETATARSAL AMPUTATION N/A 08/18/2018   Procedure: IRRIGATION AND DEBRIDEMENT OF LEFT 5TH TOE AND TRANSMETATARSAL, WITH PARTICAL LEFT 5TH TOE AND METATARSAL AMPUTATION, BONE BIOPSY, WOUND VAC APPLICATION.;  Surgeon: Evelina Bucy, DPM;  Location: [place];  Service: Podiatry;  Laterality: N/A;   UPPER EXTREMITY VENOGRAPHY N/A 11/16/2016   Procedure: Upper Extremity Venography - Right Central;  Surgeon: Elam Dutch, MD;  Location: [place] [facility];  Service: Cardiovascular;  Laterality: N/A;   UPPER EXTREMITY VENOGRAPHY N/A 05/25/2018   Procedure: UPPER EXTREMITY VENOGRAPHY - Bilateral;  Surgeon: Marty Heck, MD;  Location: [place] [facility];  Service: Cardiovascular;  Laterality: N/A;       Family History  Problem Relation Age of Onset   Diabetes Mellitus II Other    Diabetes Father    Renal Disease Father        ESRD    Social History   Tobacco Use   Smoking status: Never   Smokeless tobacco: Never  Vaping Use   Vaping Use: Never used  Substance Use Topics   Alcohol use: No   Drug use: No    Home Medications Prior to Admission medications   Medication Sig Start Date End Date Taking? Authorizing Provider  Blood Glucose Monitoring Suppl ([place]) w/Device KIT 1 Bag by Does not apply route 3 (three) times daily before meals. 05/02/20   Kerin Perna, NP  calcitRIOL (ROCALTROL) 0.5 MCG capsule Take 3 capsules (1.5 mcg total) by mouth every Monday, Wednesday, and Friday with hemodialysis. 05/23/21   Hosie Poisson, MD  cinacalcet (SENSIPAR) 60 MG tablet Take 120 mg by mouth daily after supper. 04/24/20   [provider]  gabapentin (NEURONTIN) 100 MG capsule Take 1 capsule (100 mg total) by mouth 3 (three) times daily. 07/15/21   Kerin Perna, NP  glucose blood (ONETOUCH VERIO) test strip USE STRIPS TO CHECK GLUCOSE THREE TIMES DAILY 07/24/21   Kerin Perna, NP  insulin aspart (NOVOLOG) 100 UNIT/ML injection Inject 10  Units into the skin 3 (three) times daily before meals. For blood sugars 0-150 give 0 units of insulin, 151-200 give 2 units of insulin, 201-250 give 4 units, 251-300 give 6 units, 301-350 give 8 units, 351-400 give 10 units,> 400 give 12 units and call M.D. Discussed hypoglycemia protocol. 08/01/21   Kerin Perna, NP  Lancets [facility] DELICA PLUS [id number]) MISC Apply topically. 05/02/20   [provider]  metroNIDAZOLE (FLAGYL) 500 MG tablet Take 1 tablet (500 mg total) by  mouth every 12 (twelve) hours. 05/23/21   Hosie Poisson, MD  multivitamin (RENA-VIT) TABS tablet Take 1 tablet by mouth at bedtime. 05/23/21   Hosie Poisson, MD  nutrition supplement, JUVEN, (JUVEN) PACK Take 1 packet by mouth 2 (two) times daily between meals. 05/23/21 09/20/21  Hosie Poisson, MD  Nutritional Supplements (,FEEDING SUPPLEMENT, PROSOURCE PLUS) liquid Take 30 mLs by mouth 3 (three) times daily between meals. 05/23/21 08/21/21  Hosie Poisson, MD  sevelamer carbonate (RENVELA) 800 MG tablet Take 2,400-4,000 mg by mouth See admin instructions. Take 4,000 mg (5 tablets) by mouth with meals and 2,400 mg (3 tablets) with snacks 04/27/19   [provider]    Allergies    Patient has no known allergies.  Review of Systems   Review of Systems  Respiratory:  Negative for shortness of breath.   Cardiovascular:  Negative for chest pain and leg swelling.  Musculoskeletal:  Positive for back pain.  Neurological:  Positive for headaches. Negative for syncope and facial asymmetry.  All other systems reviewed and are negative.  Physical Exam Updated Vital Signs BP (!) 153/93   Pulse 89   Temp 98 F (36.7 C) (Oral)   Resp 19   Wt 87.2 kg   SpO2 96%   BMI 31.03 kg/m   Physical Exam Constitutional:      Appearance: He is not ill-appearing or toxic-appearing.  HENT:     Head: Normocephalic.     Nose: Nose normal.  Eyes:     Pupils: Pupils are equal, round, and reactive to light.   Cardiovascular:     Rate and Rhythm: Normal rate and regular rhythm.  Pulmonary:     Effort: Pulmonary effort is normal.     Breath sounds: Normal breath sounds. No wheezing.  Abdominal:     General: Bowel sounds are normal.     Tenderness: There is no abdominal tenderness.  Musculoskeletal:        General: Normal range of motion.     Cervical back: Normal range of motion. Tenderness present. No swelling, edema, deformity or erythema.     Thoracic back: Tenderness present. No swelling, edema, deformity or signs of trauma.     Lumbar back: Normal.     Comments: Abrasions present to thoracic region  Skin:    General: Skin is warm and dry.     Capillary Refill: Capillary refill takes less than 2 seconds.  Neurological:     General: No focal deficit present.     Mental Status: He is alert and oriented to person, place, and time.     Cranial Nerves: Cranial nerves 2-12 are intact.     Sensory: Sensation is intact.     Motor: Motor function is intact.     Coordination: Coordination is intact.    ED Results / Procedures / Treatments   Labs (all labs ordered are listed, but only abnormal results are displayed) Labs Reviewed  BASIC METABOLIC PANEL - Abnormal; Notable for the following components:      Result Value   Sodium 130 (*)    Chloride 90 (*)    Glucose, Bld 242 (*)    BUN 48 (*)    Creatinine, Ser 6.72 (*)    Calcium 8.8 (*)    GFR, Estimated 10 (*)    All other components within normal limits  CBC WITH DIFFERENTIAL/PLATELET - Abnormal; Notable for the following components:   Platelets 105 (*)    All other components within normal limits  BASIC  METABOLIC PANEL - Abnormal; Notable for the following components:   Sodium 122 (*)    Potassium 5.6 (*)    Chloride 86 (*)    CO2 17 (*)    Glucose, Bld 804 (*)    BUN 64 (*)    Creatinine, Ser 8.53 (*)    Calcium 8.8 (*)    GFR, Estimated 7 (*)    Anion gap 19 (*)    All other components within normal limits   BETA-HYDROXYBUTYRIC ACID - Abnormal; Notable for the following components:   Beta-Hydroxybutyric Acid 0.39 (*)    All other components within normal limits  CBG MONITORING, ED - Abnormal; Notable for the following components:   Glucose-Capillary >600 (*)    All other components within normal limits  CBG MONITORING, ED - Abnormal; Notable for the following components:   Glucose-Capillary 585 (*)    All other components within normal limits  I-STAT VENOUS BLOOD GAS, ED - Abnormal; Notable for the following components:   pCO2, Ven 43.4 (*)    pO2, Ven 64.0 (*)    Acid-base deficit 6.0 (*)    Sodium 118 (*)    Potassium >8.5 (*)    Calcium, Ion 0.95 (*)    All other components within normal limits  CBG MONITORING, ED - Abnormal; Notable for the following components:   Glucose-Capillary 352 (*)    All other components within normal limits  RESP PANEL BY RT-PCR (FLU A&B, COVID) ARPGX2  HIV ANTIBODY (ROUTINE TESTING W REFLEX)  CBC  MAGNESIUM  PHOSPHORUS  BASIC METABOLIC PANEL  BASIC METABOLIC PANEL  BASIC METABOLIC PANEL  BASIC METABOLIC PANEL  BETA-HYDROXYBUTYRIC ACID  BETA-HYDROXYBUTYRIC ACID  HEMOGLOBIN A1C  OSMOLALITY    EKG None  Radiology DG Chest 2 View  Result Date: 08/08/2021 CLINICAL DATA:  Concern for fluid overload. Pain with inspiration. Technologist notes state dropped down stairs this morning while being transported to dialysis. Thoracic back pain. EXAM: CHEST - 2 VIEW COMPARISON:  Chest radiograph 07/28/2020 FINDINGS: Right-sided dialysis catheter remains in place. Cardiomegaly is unchanged. Mediastinal contours are unchanged. Mild vascular congestion without overt edema. Suspect trace bilateral pleural effusions. No focal airspace disease. No pneumothorax. Left axillary vascular stent. No acute osseous abnormalities are seen. Thoracic spine assessed on dedicated thoracic spine radiographs performed concurrently. IMPRESSION: Stable cardiomegaly with vascular  congestion. Suspect trace bilateral pleural effusions. Electronically Signed   By: Keith Rake M.D.   On: 08/08/2021 16:42   DG Thoracic Spine 2 View  Result Date: 08/08/2021 CLINICAL DATA:  Patient reports thoracic back pain after being dropped down some stairs this morning while being transported to dialysis. EXAM: THORACIC SPINE 2 VIEWS COMPARISON:  None. FINDINGS: No evidence of acute fracture or compression deformity. Normal alignment. Vertebral body heights are preserved, upper aspect of the thoracic spine is partially obscured on the lateral view. No evidence of focal bone abnormality. No paravertebral soft tissue abnormality. IMPRESSION: Negative radiographs of the thoracic spine. Electronically Signed   By: Keith Rake M.D.   On: 08/08/2021 16:44   CT HEAD WO CONTRAST (5MM)  Result Date: 08/08/2021 CLINICAL DATA:  fall, head injury EXAM: CT HEAD WITHOUT CONTRAST TECHNIQUE: Contiguous axial images were obtained from the base of the skull through the vertex without intravenous contrast. COMPARISON:  05/30/2020. FINDINGS: Brain: No evidence of acute infarction, hemorrhage, hydrocephalus, extra-axial collection or mass lesion/mass effect. Vascular: No hyperdense vessel identified. Skull: No acute fracture. Sinuses/Orbits: Partially imaged inferior left maxillary sinus mucosal thickening. Otherwise, clear sinuses.  Unremarkable orbits. Other: No mastoid effusions. IMPRESSION: No evidence of acute intracranial abnormality. Electronically Signed   By: Margaretha Sheffield M.D.   On: 08/08/2021 16:38   CT Cervical Spine Wo Contrast  Result Date: 08/08/2021 CLINICAL DATA:  Provided history: Fall, injury. Additional history provided: Fall today (hitting back of head). Patient reports headache, neck pain, dizziness. EXAM: CT CERVICAL SPINE WITHOUT CONTRAST TECHNIQUE: Multidetector CT imaging of the cervical spine was performed without intravenous contrast. Multiplanar CT image reconstructions were  also generated. COMPARISON:  CT cervical spine 05/30/2020. FINDINGS: Alignment: Reversal of mild reversal of the expected cervical lordosis. No significant spondylolisthesis. Skull base and vertebrae: The basion-dental and atlanto-dental intervals are maintained.No evidence of acute fracture to the cervical spine. 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 Soft tissues and spinal canal: No prevertebral fluid or swelling. No visible canal hematoma. Disc levels: No significant bony spinal canal or neural foraminal narrowing. Upper chest: No consolidation within the imaged lung apices. No visible pneumothorax. Other: Partially imaged right-sided dialysis catheter. Age-advanced arterial vascular calcifications. IMPRESSION: No evidence of acute fracture to the cervical spine. Mild reversal of the expected cervical lordosis. Electronically Signed   By: Kellie Simmering D.O.   On: 08/08/2021 16:44    Procedures .Critical Care E&M Performed by: Azucena Cecil, PA  Critical care provider statement:    Critical care time (minutes):  45   Critical care was necessary to treat or prevent imminent or life-threatening deterioration of the following conditions:  Endocrine crisis   Critical care was time spent personally by me on the following activities:  Ordering and performing treatments and interventions, ordering and review of laboratory studies, ordering and review of radiographic studies, pulse oximetry, re-evaluation of patient's condition, review of old charts, obtaining history from patient or surrogate, examination of patient, evaluation of patient's response to treatment, discussions with primary provider, discussions with consultants and development of treatment plan with patient or surrogate   Care discussed with: admitting provider   After initial E/M assessment, critical care services were subsequently performed that were exclusive of separately billable procedures or  treatment.     Medications Ordered in ED Medications  heparin injection 5,000 Units (has no administration in time range)  acetaminophen (TYLENOL) tablet 650 mg (has no administration in time range)    Or  acetaminophen (TYLENOL) suppository 650 mg (has no administration in time range)  ondansetron (ZOFRAN) tablet 4 mg (has no administration in time range)    Or  ondansetron (ZOFRAN) injection 4 mg (has no administration in time range)  lactated ringers bolus 1,744 mL (has no administration in time range)  insulin regular, human (MYXREDLIN) 100 units/ 100 mL infusion (has no administration in time range)  lactated ringers infusion (has no administration in time range)  dextrose 5 % in lactated ringers infusion (0 mLs Intravenous Hold 08/09/21 1201)  dextrose 50 % solution 0-50 mL (has no administration in time range)  oxyCODONE-acetaminophen (PERCOCET/ROXICET) 5-325 MG per tablet 1 tablet (1 tablet Oral Given 08/09/21 0828)  insulin aspart (novoLOG) injection 12 Units (12 Units Subcutaneous Given 08/09/21 6812)    ED Course  I have reviewed the triage vital signs and the nursing notes.  Pertinent labs & imaging results that were available during my care of the patient were reviewed by me and considered in my medical decision making (see chart for details).  Clinical Course as of 08/09/21 1258  Sat Aug 09, 2021  7517 Sodium(!): 130 [CG]  Clinical Course User Index [CG] Azucena Cecil, [place]   MDM Rules/Calculators/A&P                          [id number] who presents s/p fall with head strike and back pain,  no neuro deficits observed. Patient is complaining of headache and back pain and has thoracic and cervical tenderness. Patient also has slight abrasions to thoracic region. Patient neuro exam is WNL. Patient imaging includes thoracic spine and chest xrays, CT cervical spine and CT head. All imaging is negative for any signs of fracture or bleeding. Patient back pain and headache  will be treated with one time dose of oxycodone due to renal deficiency.   Patient is hyperglycemic with a CBG >600 and BMP showing BG of 804. Patient had initial CBG of 200 upon arrival. Cause of hyperglycemia most likely due to extended wait time in ED. Patient now has CBG of >600 most likely due to not receiving insulin overnight. Patient is presumed to be in DKA based on elevated anion gap of 19. Patient DKA order set was initiated. Patient will be started on insulin drip, will hold off on fluids due to ESRD status.   Due to pain patient was experiencing from his fall, he states he was only able to complete 1 hour of dialysis on Friday. Patient lungs were CTAB, chest xray completed with no signs of fluid overload. Patient does have hyperkalemia with VBG showing potassium at >5.6. Patient is set to be admitted at this time so will defer to hospitalists on need for hemodialysis.   Case discussed with internal medicine teaching service who will see and admit patient. Patient is agreeable to plan.  Final Clinical Impression(s) / ED Diagnoses Final diagnoses:  Diabetic ketoacidosis without coma associated with type 2 diabetes mellitus ([place])  Acute midline thoracic back pain  Fall, initial encounter    Rx / DC Orders ED Discharge Orders     None        Azucena Cecil, [place] 08/09/21 1258    Charlesetta Shanks, MD 08/14/21 [phone]

## 2021-08-09 NOTE — ED Notes (Signed)
Anice Paganini PA with nephro rounded and says HOLD the insulin drip until after BMP results.

## 2021-08-09 NOTE — ED Notes (Signed)
Pt in need of IV fluids and IV insulin, hard stick with hx dialysis, IV team consult placed.

## 2021-08-09 NOTE — ED Notes (Signed)
Dr Marianna Payment says wait until [facility] results to determine if pt needs insulin gtt at this time, hold insulin gtt.

## 2021-08-10 ENCOUNTER — Encounter (HOSPITAL_COMMUNITY): Payer: Self-pay | Admitting: Student in an Organized Health Care Education/Training Program

## 2021-08-10 DIAGNOSIS — E1010 Type 1 diabetes mellitus with ketoacidosis without coma: Principal | ICD-10-CM

## 2021-08-10 DIAGNOSIS — W19XXXA Unspecified fall, initial encounter: Secondary | ICD-10-CM | POA: Diagnosis not present

## 2021-08-10 DIAGNOSIS — Z794 Long term (current) use of insulin: Secondary | ICD-10-CM | POA: Diagnosis not present

## 2021-08-10 DIAGNOSIS — E1043 Type 1 diabetes mellitus with diabetic autonomic (poly)neuropathy: Secondary | ICD-10-CM | POA: Diagnosis present

## 2021-08-10 DIAGNOSIS — Z8616 Personal history of COVID-19: Secondary | ICD-10-CM | POA: Diagnosis not present

## 2021-08-10 DIAGNOSIS — N2581 Secondary hyperparathyroidism of renal origin: Secondary | ICD-10-CM | POA: Diagnosis present

## 2021-08-10 DIAGNOSIS — E875 Hyperkalemia: Secondary | ICD-10-CM | POA: Diagnosis not present

## 2021-08-10 DIAGNOSIS — Z89612 Acquired absence of left leg above knee: Secondary | ICD-10-CM | POA: Diagnosis not present

## 2021-08-10 DIAGNOSIS — Z833 Family history of diabetes mellitus: Secondary | ICD-10-CM | POA: Diagnosis not present

## 2021-08-10 DIAGNOSIS — K3184 Gastroparesis: Secondary | ICD-10-CM | POA: Diagnosis present

## 2021-08-10 DIAGNOSIS — N250 Renal osteodystrophy: Secondary | ICD-10-CM | POA: Diagnosis not present

## 2021-08-10 DIAGNOSIS — N186 End stage renal disease: Secondary | ICD-10-CM | POA: Diagnosis not present

## 2021-08-10 DIAGNOSIS — E1022 Type 1 diabetes mellitus with diabetic chronic kidney disease: Secondary | ICD-10-CM | POA: Diagnosis not present

## 2021-08-10 DIAGNOSIS — M549 Dorsalgia, unspecified: Secondary | ICD-10-CM | POA: Diagnosis not present

## 2021-08-10 DIAGNOSIS — I120 Hypertensive chronic kidney disease with stage 5 chronic kidney disease or end stage renal disease: Secondary | ICD-10-CM | POA: Diagnosis not present

## 2021-08-10 DIAGNOSIS — Z992 Dependence on renal dialysis: Secondary | ICD-10-CM | POA: Diagnosis not present

## 2021-08-10 DIAGNOSIS — Z79899 Other long term (current) drug therapy: Secondary | ICD-10-CM | POA: Diagnosis not present

## 2021-08-10 DIAGNOSIS — D631 Anemia in chronic kidney disease: Secondary | ICD-10-CM | POA: Diagnosis not present

## 2021-08-10 LAB — GLUCOSE, CAPILLARY
Glucose-Capillary: 104 mg/dL — ABNORMAL HIGH (ref 70–99)
Glucose-Capillary: 105 mg/dL — ABNORMAL HIGH (ref 70–99)
Glucose-Capillary: 120 mg/dL — ABNORMAL HIGH (ref 70–99)
Glucose-Capillary: 150 mg/dL — ABNORMAL HIGH (ref 70–99)
Glucose-Capillary: 156 mg/dL — ABNORMAL HIGH (ref 70–99)
Glucose-Capillary: 169 mg/dL — ABNORMAL HIGH (ref 70–99)
Glucose-Capillary: 251 mg/dL — ABNORMAL HIGH (ref 70–99)
Glucose-Capillary: 265 mg/dL — ABNORMAL HIGH (ref 70–99)
Glucose-Capillary: 272 mg/dL — ABNORMAL HIGH (ref 70–99)
Glucose-Capillary: 288 mg/dL — ABNORMAL HIGH (ref 70–99)
Glucose-Capillary: 51 mg/dL — ABNORMAL LOW (ref 70–99)
Glucose-Capillary: 65 mg/dL — ABNORMAL LOW (ref 70–99)
Glucose-Capillary: 77 mg/dL (ref 70–99)
Glucose-Capillary: 80 mg/dL (ref 70–99)
Glucose-Capillary: 95 mg/dL (ref 70–99)

## 2021-08-10 LAB — BASIC METABOLIC PANEL
Anion gap: 13 (ref 5–15)
Anion gap: 13 (ref 5–15)
BUN: 26 mg/dL — ABNORMAL HIGH (ref 6–20)
BUN: 28 mg/dL — ABNORMAL HIGH (ref 6–20)
CO2: 23 mmol/L (ref 22–32)
CO2: 24 mmol/L (ref 22–32)
Calcium: 8.7 mg/dL — ABNORMAL LOW (ref 8.9–10.3)
Calcium: 9 mg/dL (ref 8.9–10.3)
Chloride: 95 mmol/L — ABNORMAL LOW (ref 98–111)
Chloride: 93 mmol/L — ABNORMAL LOW (ref 98–111)
Creatinine, Ser: 5.18 mg/dL — ABNORMAL HIGH (ref 0.61–1.24)
Creatinine, Ser: 5.78 mg/dL — ABNORMAL HIGH (ref 0.61–1.24)
GFR, Estimated: 13 mL/min — ABNORMAL LOW (ref >60)
GFR, Estimated: 12 mL/min — ABNORMAL LOW (ref >60)
Glucose, Bld: 126 mg/dL — ABNORMAL HIGH (ref 70–99)
Glucose, Bld: 204 mg/dL — ABNORMAL HIGH (ref 70–99)
Potassium: 3.8 mmol/L (ref 3.5–5.1)
Potassium: 4.1 mmol/L (ref 3.5–5.1)
Sodium: 131 mmol/L — ABNORMAL LOW (ref 135–145)
Sodium: 130 mmol/L — ABNORMAL LOW (ref 135–145)

## 2021-08-10 LAB — HEPATITIS B SURFACE ANTIBODY,QUALITATIVE: Hep B S Ab: REACTIVE — AB

## 2021-08-10 LAB — BETA-HYDROXYBUTYRIC ACID: Beta-Hydroxybutyric Acid: 0.09 mmol/L (ref 0.05–0.27)

## 2021-08-10 MED ORDER — OXYCODONE HCL 5 MG PO TABS
2.5000 mg | ORAL_TABLET | Freq: Once | ORAL | Status: AC
  Administered 2021-08-10: 2.5 mg via ORAL
  Filled 2021-08-10: qty 1

## 2021-08-10 MED ORDER — INSULIN ASPART 100 UNIT/ML IJ SOLN: 6.0000 [IU] | Freq: Three times a day (TID) | INTRAMUSCULAR | Status: DC

## 2021-08-10 MED ORDER — INSULIN ASPART 100 UNIT/ML IJ SOLN: 0.0000 [IU] | Freq: Three times a day (TID) | INTRAMUSCULAR | Status: DC

## 2021-08-10 MED ORDER — CHLORHEXIDINE GLUCONATE CLOTH 2 % EX PADS
6.0000 | MEDICATED_PAD | Freq: Every day | CUTANEOUS | Status: DC
  Administered 2021-08-10 – 2021-08-11 (×2): 6 via TOPICAL

## 2021-08-10 MED ORDER — HYDROMORPHONE HCL 1 MG/ML IJ SOLN
0.5000 mg | Freq: Once | INTRAMUSCULAR | Status: AC
  Administered 2021-08-10: 0.5 mg via INTRAVENOUS
  Filled 2021-08-10: qty 0.5

## 2021-08-10 MED ORDER — DEXTROSE 50 % IV SOLN
INTRAVENOUS | Status: AC
  Administered 2021-08-10: 50 mL via INTRAVENOUS
  Filled 2021-08-10: qty 50

## 2021-08-10 MED ORDER — INSULIN ASPART 100 UNIT/ML IJ SOLN
0.0000 [IU] | Freq: Every day | INTRAMUSCULAR | Status: DC
  Administered 2021-08-10: 3 [IU] via SUBCUTANEOUS

## 2021-08-10 MED ORDER — INSULIN ASPART 100 UNIT/ML IJ SOLN
0.0000 [IU] | Freq: Three times a day (TID) | INTRAMUSCULAR | Status: DC
  Administered 2021-08-10: 3 [IU] via SUBCUTANEOUS

## 2021-08-10 MED ORDER — INSULIN ASPART 100 UNIT/ML IJ SOLN
0.0000 [IU] | Freq: Three times a day (TID) | INTRAMUSCULAR | Status: DC
  Administered 2021-08-10: 5 [IU] via SUBCUTANEOUS
  Administered 2021-08-11: 7 [IU] via SUBCUTANEOUS

## 2021-08-10 MED ORDER — INSULIN ASPART 100 UNIT/ML IJ SOLN
4.0000 [IU] | Freq: Three times a day (TID) | INTRAMUSCULAR | Status: DC
  Administered 2021-08-10 – 2021-08-11 (×2): 4 [IU] via SUBCUTANEOUS

## 2021-08-10 NOTE — Progress Notes (Signed)
[facility] Progress Note   Subjective:   Pt seen in room. Achieved 4.4L UF with HD yesterday and reports feeling better. Denies SOB, CP, palpitations, dizziness and nausea. Asking to go home today or tomorrow so he can go to his prosthesis appt on Tuesday.   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 CBC: Recent Labs  Lab 08/08/21 1541 08/09/21 1114 08/09/21 1153  WBC 4.3  --  5.4  NEUTROABS 2.7  --   --   HGB 13.7 14.6 14.4  HCT 43.9 43.0 45.2  MCV 91.5  --  90.2  PLT 105*  --  118*   Blood Culture    Component Value Date/Time   SDES BLOOD RIGHT ANTECUBITAL 09/21/2020 0851   SDES BLOOD RIGHT ANTECUBITAL 09/21/2020 0851   SPECREQUEST  09/21/2020 0851    BOTTLES DRAWN AEROBIC AND ANAEROBIC Blood Culture adequate volume   SPECREQUEST  09/21/2020 0851    BOTTLES DRAWN AEROBIC AND ANAEROBIC Blood Culture results may not be optimal due to an inadequate volume of blood received in culture bottles   CULT  09/21/2020 0851    NO  GROWTH 5 DAYS Performed at [facility] [address], [place], [place] [address]    CULT  09/21/2020 [phone]    NO GROWTH 5 DAYS Performed at [facility] [address] [place], [place] [address]    REPTSTATUS 09/26/2020 FINAL 09/21/2020 0851   REPTSTATUS 09/26/2020 FINAL 09/21/2020 0851    Cardiac Enzymes: No results for input(s): CKTOTAL, CKMB, CKMBINDEX, TROPONINI in the last 168 hours. CBG: Recent Labs  Lab 08/10/21 0400 08/10/21 0455 08/10/21 0536 08/10/21 0624 08/10/21 0741  GLUCAP 105* 65* 150* 120* 104*   Iron Studies: No results for input(s): IRON, TIBC, TRANSFERRIN, FERRITIN in the last 72 hours. @lablastinr3 @ Studies/Results: DG Chest 2 View  Result Date: 08/08/2021 CLINICAL DATA:  Concern for fluid overload. Pain with inspiration. Technologist notes state dropped down stairs this morning while being transported to dialysis. Thoracic back pain. EXAM: CHEST - 2 VIEW COMPARISON:  Chest radiograph 07/28/2020 FINDINGS: Right-sided dialysis catheter remains in place. Cardiomegaly is unchanged. Mediastinal contours are unchanged. Mild vascular congestion without overt edema. Suspect trace bilateral pleural effusions. No focal airspace disease. No pneumothorax. Left axillary vascular stent. No acute osseous abnormalities are seen. Thoracic spine assessed  on dedicated thoracic spine radiographs performed concurrently. IMPRESSION: Stable cardiomegaly with vascular congestion. Suspect trace bilateral pleural effusions. Electronically Signed   By: Keith Rake M.D.   On: 08/08/2021 16:42   DG Thoracic Spine 2 View  Result Date: 08/08/2021 CLINICAL DATA:  Patient reports thoracic back pain after being dropped down some stairs this morning while being transported to dialysis. EXAM: THORACIC SPINE 2 VIEWS COMPARISON:  None. FINDINGS: No evidence of acute fracture or compression deformity. Normal alignment. Vertebral body heights are preserved, upper aspect of the  thoracic spine is partially obscured on the lateral view. No evidence of focal bone abnormality. No paravertebral soft tissue abnormality. IMPRESSION: Negative radiographs of the thoracic spine. Electronically Signed   By: Keith Rake M.D.   On: 08/08/2021 16:44   CT HEAD WO CONTRAST (5MM)  Result Date: 08/08/2021 CLINICAL DATA:  fall, head injury EXAM: CT HEAD WITHOUT CONTRAST TECHNIQUE: Contiguous axial images were obtained from the base of the skull through the vertex without intravenous contrast. COMPARISON:  05/30/2020. FINDINGS: Brain: No evidence of acute infarction, hemorrhage, hydrocephalus, extra-axial collection or mass lesion/mass effect. Vascular: No hyperdense vessel identified. Skull: No acute fracture. Sinuses/Orbits: Partially imaged inferior left maxillary sinus mucosal thickening. Otherwise, clear sinuses. Unremarkable orbits. Other: No mastoid effusions. IMPRESSION: No evidence of acute intracranial abnormality. Electronically Signed   By: Margaretha Sheffield M.D.   On: 08/08/2021 16:38   CT Cervical Spine Wo Contrast  Result Date: 08/08/2021 CLINICAL DATA:  Provided history: Fall, injury. Additional history provided: Fall today (hitting back of head). Patient reports headache, neck pain, dizziness. EXAM: CT CERVICAL SPINE WITHOUT CONTRAST TECHNIQUE: Multidetector CT imaging of the cervical spine was performed without intravenous contrast. Multiplanar CT image reconstructions were also generated. COMPARISON:  CT cervical spine 05/30/2020. FINDINGS: Alignment: Reversal of mild reversal of the expected cervical lordosis. No significant spondylolisthesis. Skull base and vertebrae: The basion-dental and atlanto-dental intervals are maintained.No evidence of acute fracture to the cervical spine. 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 Soft tissues and spinal canal: No prevertebral fluid or swelling. No visible canal hematoma. Disc levels: No  significant bony spinal canal or neural foraminal narrowing. Upper chest: No consolidation within the imaged lung apices. No visible pneumothorax. Other: Partially imaged right-sided dialysis catheter. Age-advanced arterial vascular calcifications. IMPRESSION: No evidence of acute fracture to the cervical spine. Mild reversal of the expected cervical lordosis. Electronically Signed   By: Kellie Simmering D.O.   On: 08/08/2021 16:44   Medications:  dextrose 5% lactated ringers 30 mL/hr at 08/10/21 0600   insulin 0.2 Units/hr (08/10/21 0600)    Chlorhexidine Gluconate Cloth  6 each Topical Q0600   heparin  5,000 Units Subcutaneous Q8H   sevelamer carbonate  2,400 mg Oral With snacks   sevelamer carbonate  4,000 mg Oral TID with meals    Dialysis Orders: Center: [facility]  on MWF. 180NRe, 4 hours 1min, BFR 400, DFR A1.5, EDW 75kg (last weight was 92.1kg), 2K, 2Ca, TDC, no heparin Calcitriol 1.4mcg PO q HD  Assessment/Plan: Fall: Reports fall while being transported in his wheelchair. CT head and cervical spine unremarkable. Management per primary team.  Hyperglycemia: Missed his PM insulin prior to admission, blood glucose initially >800 but has come down to goal. Insulin drip per primary team.  Hyperkalemia: Mild, In setting of hyperglycemia and ESRD. Resolved with HD  ESRD:  Dialysis on MWF, had abbreviated treatment Friday due to pain from his fall. Extra HD Saturday with good UF.  Next HD Monday, can be done outpatient if patient discharges today.   Hypertension/volume: Vascular congestion on CXR but O2 saturations are good on room air. Significantly over his EDW, unclear what his true dry weight should be at this point. Tolerates UF goals of 5.5L outpatient- continue max goals as toelrated  Anemia: Hgb at goal, not on ESA.   Metabolic bone disease: Last calcium level ok, phos 6.8. Resume binders once eating.   Nutrition:  Albumin pending, changed diet to renal/carb modified with fluid restrictions.    Anice Paganini, PA-C 08/10/2021, 8:06 AM  [facility] Pager: [phone]

## 2021-08-10 NOTE — Progress Notes (Addendum)
HD#0 Subjective:  Overnight Events: Paged by RN due to patient complaining about every hour glucose checks.  Patient is still agreeable to continuing hourly glucose monitoring.  Patient seen and assessed this morning by bedside.  Patient complaining about how he still requires insulin drip even though his blood glucose is low.  Patient does not understand why this is the case.  Explained to patient that he was in metabolic acidosis however patient did not quite understand this and continued to perseverate on the fact that he was confused and frustrated with everything that had happened in this hospitalization including his wait during ED admission.  Patient also verbalized confusion while he had been in DKA as he reports he did not feel classic symptoms that he did previously when he was in DKA in the past.  Patient requesting to speak with patient advocate.  Patient had no other physical complaints at this time and is resting comfortably.  Discussed plan with patient to transition him off insulin drip and back to his home insulin.  Discussed plan to discharge patient tomorrow should his blood glucose be under control and his DKA resolved.  Objective:  Vital signs in last 24 hours: Vitals:   08/10/21 0458 08/10/21 0500 08/10/21 0501 08/10/21 0710  BP:  (!) 151/101 (!) 144/93 (!) 153/72  Pulse:      Resp: 13 13  16   Temp:    97.8 F (36.6 C)  TempSrc:    Oral  SpO2:    97%  Weight:  89.2 kg     Supplemental O2: Room Air SpO2: 97 %   Physical Exam:  Physical Exam Constitutional:      Appearance: Normal appearance.  Cardiovascular:     Rate and Rhythm: Normal rate and regular rhythm.  Abdominal:     General: Bowel sounds are normal.     Palpations: Abdomen is soft.  Skin:    General: Skin is warm and dry.  Neurological:     General: No focal deficit present.     Mental Status: He is alert. Mental status is at baseline.    Filed Weights   08/09/21 1200 08/10/21 0005  08/10/21 0500  Weight: 87.2 kg 92 kg 89.2 kg     Intake/Output Summary (Last 24 hours) at 08/10/2021 1127 Last data filed at 08/10/2021 0956 Gross per 24 hour  Intake 721.88 ml  Output 4399 ml  Net -3677.12 ml   Net IO Since Admission: -3,677.12 mL [08/10/21 1127]  Pertinent Labs: CBC Latest Ref Rng & Units 08/09/2021 08/09/2021 08/08/2021  WBC 4.0 - 10.5 K/uL 5.4 - 4.3  Hemoglobin 13.0 - 17.0 g/dL 14.4 14.6 13.7  Hematocrit 39.0 - 52.0 % 45.2 43.0 43.9  Platelets 150 - 400 K/uL 118(L) - 105(L)    CMP Latest Ref Rng & Units 08/10/2021 08/10/2021 08/09/2021  Glucose 70 - 99 mg/dL 204(H) 126(H) 73  BUN 6 - 20 mg/dL 28(H) 26(H) 67(H)  Creatinine 0.61 - 1.24 mg/dL 5.78(H) 5.18(H) 8.93(H)  Sodium 135 - 145 mmol/L 130(L) 131(L) 129(L)  Potassium 3.5 - 5.1 mmol/L 4.1 3.8 5.1  Chloride 98 - 111 mmol/L 93(L) 95(L) 95(L)  CO2 22 - 32 mmol/L 24 23 19(L)  Calcium 8.9 - 10.3 mg/dL 9.0 8.7(L) 9.2  Total Protein 6.5 - 8.1 g/dL - - -  Total Bilirubin 0.3 - 1.2 mg/dL - - -  Alkaline Phos 38 - 126 U/L - - -  AST 15 - 41 U/L - - -  ALT  0 - 44 U/L - - -    Imaging: No results found.  Assessment/Plan:   Active Problems:   Type 2 diabetes mellitus with complication, without long-term current use of insulin ([name])   Diabetic gastroparesis ([name])   ESRD on dialysis ([place])   HTN (hypertension)   Metabolic acidosis   DKA (diabetic ketoacidosis) ([place])   Patient Summary: [name] is a 40 y.o. with pertinent PMH of ESRD, type 2 diabetes, diabetic gastroparesis, hypertension, anemia of chronic disease, who presented after a fall with hyperglycemia and admit for DKA versus HHS on hospital day 1.   #Hyperglycemia crisis secondary to DKA #Type 1 Diabetes Mellitus #AGMA, now closed gap Patient presents with hyperglycemic crisis in the setting of missed insulin doses.  He is currently asymptomatic from his hyperglycemia.  -Transition insulin drip to insulin injection today -Monitor  electrolytes -Patient reports that he does not want hospital diet and will only eat outside food that he will provide himself -Consult to diabetic coordinator as patient is not on basal insulin as a type 1 diabetic - Mg 3.0 - Phos 6.8 - A1c 9.3   #Fall: Patient presents after a fall with headache, neck pain, and back pain.  All imaging is reassuring for structural damage.  Patient reports pain is well controlled at this time -Tylenol 1000 mg 3 times daily - Lidocaine patch to back    #ESRD: Patient only finished 1 hour of HD on Friday.  He is on Monday Wednesday Friday scheduling.  We will reach out to nephrology for possible inpatient hemodialysis. -Consult nephrology for inpatient hemodialysis.  Appreciate recommendations and assistance.   #Hyperkalemia: Patient has critically elevated hyperkalemia in the setting of DKA/HHS as well as a history of ESRD.  He is on insulin drip with plan to transition to insulin injection today.  -EKG stat -Transition insulin drip to insulin injections -Potassium 4.1  Diet: Normal IVF: None,None VTE: Heparin Code: Full PT/OT recs: None, none.    Dispo: Anticipated discharge to Home in 1 days pending resolution of DKA.   Shane Ravens, MD 08/10/2021, 11:27 AM Pager: [phone]  Please contact the on call pager after 5 pm and on weekends at [phone].

## 2021-08-10 NOTE — Progress Notes (Signed)
Inpatient Diabetes Program Recommendations  AACE/ADA: New Consensus Statement on Inpatient Glycemic Control (2015)  Target Ranges:  Prepandial:   less than 140 mg/dL      Peak postprandial:   less than 180 mg/dL (1-2 hours)      Critically ill patients:  140 - 180 mg/dL  Results for [name], [name] (MRN [id number]) as of 08/10/2021 13:05  Ref. Range 08/09/2021 08:52  Sodium Latest Ref Range: 135 - 145 mmol/L 122 (L)  Potassium Latest Ref Range: 3.5 - 5.1 mmol/L 5.6 (H)  Chloride Latest Ref Range: 98 - 111 mmol/L 86 (L)  CO2 Latest Ref Range: 22 - 32 mmol/L 17 (L)  Glucose Latest Ref Range: 70 - 99 mg/dL 804 (HH)  BUN Latest Ref Range: 6 - 20 mg/dL 64 (H)  Creatinine Latest Ref Range: 0.61 - 1.24 mg/dL 8.53 (H)  Calcium Latest Ref Range: 8.9 - 10.3 mg/dL 8.8 (L)  Anion gap Latest Ref Range: 5 - 15  19 (H)  Results for [name], [name] (MRN [id number]) as of 08/10/2021 13:05  Ref. Range 08/09/2021 07:26 08/09/2021 10:13 08/09/2021 12:04 08/09/2021 13:05 08/09/2021 14:39 08/09/2021 15:26 08/09/2021 16:44 08/09/2021 17:40 08/09/2021 18:16 08/09/2021 20:53 08/09/2021 22:18  Glucose-Capillary Latest Ref Range: 70 - 99 mg/dL >600 (HH)  12 units Novolog @0829  585 (HH) 352 (H) 294 (H) 173 (H)  IV Insulin Drip Started @1457  142 (H) 92  IV Insulin Drip Stopped @1642  64 (L) 81 212 (H)  IV Insulin Drip Restarted @2110  293 (H)  Results for [name], [name] (MRN [id number]) as of 08/10/2021 13:05  Ref. Range 08/09/2021 23:26 08/10/2021 00:06 08/10/2021 01:13 08/10/2021 02:27 08/10/2021 03:24 08/10/2021 04:00 08/10/2021 04:55 08/10/2021 05:36 08/10/2021 06:24 08/10/2021 07:41 08/10/2021 08:42 08/10/2021 09:53 08/10/2021 10:55 08/10/2021 12:00  Glucose-Capillary Latest Ref Range: 70 - 99 mg/dL 224 (H) 169 (H) 95 77 51 (L) 105 (H) 65 (L) 150 (H) 120 (H) 104 (H) 80 156 (H) 272 (H) 251 (H)  IV Insulin Drip Stopped again @1225     Admit with: DKA  History: Type 1 DM, ESRD  Home DM Meds: Novolog 10 units  TID with meals       Novolog SSI  Current Orders: Novolog Moderate Correction Scale/ SSI (0-15 units) TID AC     Novolog 6 units TID with meals    Note discontinuation of the IV Insulin Drip without basal insulin administration.  Novolog SSI and Novolog Meal Coverage not due to start until 5pm this evening  BMET from 1015am showed AG and CO2 levels WNL    --Will follow patient during hospitalization--  Wyn Quaker RN, MSN, CDE Diabetes Coordinator Inpatient Glycemic Control Team Team Pager: [phone] (8a-5p)

## 2021-08-11 DIAGNOSIS — D631 Anemia in chronic kidney disease: Secondary | ICD-10-CM | POA: Diagnosis not present

## 2021-08-11 DIAGNOSIS — M549 Dorsalgia, unspecified: Secondary | ICD-10-CM

## 2021-08-11 DIAGNOSIS — N186 End stage renal disease: Secondary | ICD-10-CM | POA: Diagnosis not present

## 2021-08-11 DIAGNOSIS — Z992 Dependence on renal dialysis: Secondary | ICD-10-CM

## 2021-08-11 DIAGNOSIS — E1010 Type 1 diabetes mellitus with ketoacidosis without coma: Secondary | ICD-10-CM | POA: Diagnosis not present

## 2021-08-11 DIAGNOSIS — I10 Essential (primary) hypertension: Secondary | ICD-10-CM | POA: Diagnosis not present

## 2021-08-11 DIAGNOSIS — E875 Hyperkalemia: Secondary | ICD-10-CM | POA: Diagnosis not present

## 2021-08-11 DIAGNOSIS — W19XXXA Unspecified fall, initial encounter: Secondary | ICD-10-CM | POA: Diagnosis not present

## 2021-08-11 DIAGNOSIS — I120 Hypertensive chronic kidney disease with stage 5 chronic kidney disease or end stage renal disease: Secondary | ICD-10-CM

## 2021-08-11 DIAGNOSIS — E1022 Type 1 diabetes mellitus with diabetic chronic kidney disease: Secondary | ICD-10-CM

## 2021-08-11 DIAGNOSIS — N250 Renal osteodystrophy: Secondary | ICD-10-CM | POA: Diagnosis not present

## 2021-08-11 LAB — CBC WITH DIFFERENTIAL/PLATELET
Abs Immature Granulocytes: 0.02 10*3/uL (ref 0.00–0.07)
Basophils Absolute: 0.1 10*3/uL (ref 0.0–0.1)
Basophils Relative: 1 %
Eosinophils Absolute: 0.5 10*3/uL (ref 0.0–0.5)
Eosinophils Relative: 12 %
HCT: 42.9 % (ref 39.0–52.0)
Hemoglobin: 13.4 g/dL (ref 13.0–17.0)
Immature Granulocytes: 1 %
Lymphocytes Relative: 27 %
Lymphs Abs: 1 10*3/uL (ref 0.7–4.0)
MCH: 28.5 pg (ref 26.0–34.0)
MCHC: 31.2 g/dL (ref 30.0–36.0)
MCV: 91.3 fL (ref 80.0–100.0)
Monocytes Absolute: 0.3 10*3/uL (ref 0.1–1.0)
Monocytes Relative: 9 %
Neutro Abs: 2 10*3/uL (ref 1.7–7.7)
Neutrophils Relative %: 50 %
Platelets: 115 10*3/uL — ABNORMAL LOW (ref 150–400)
RBC: 4.7 MIL/uL (ref 4.22–5.81)
RDW: 15 % (ref 11.5–15.5)
WBC: 3.9 10*3/uL — ABNORMAL LOW (ref 4.0–10.5)
nRBC: 0 % (ref 0.0–0.2)

## 2021-08-11 LAB — BASIC METABOLIC PANEL
Anion gap: 17 — ABNORMAL HIGH (ref 5–15)
BUN: 44 mg/dL — ABNORMAL HIGH (ref 6–20)
CO2: 23 mmol/L (ref 22–32)
Calcium: 9.4 mg/dL (ref 8.9–10.3)
Chloride: 90 mmol/L — ABNORMAL LOW (ref 98–111)
Creatinine, Ser: 7.79 mg/dL — ABNORMAL HIGH (ref 0.61–1.24)
GFR, Estimated: 8 mL/min — ABNORMAL LOW (ref >60)
Glucose, Bld: 329 mg/dL — ABNORMAL HIGH (ref 70–99)
Potassium: 5 mmol/L (ref 3.5–5.1)
Sodium: 130 mmol/L — ABNORMAL LOW (ref 135–145)

## 2021-08-11 LAB — GLUCOSE, CAPILLARY
Glucose-Capillary: 124 mg/dL — ABNORMAL HIGH (ref 70–99)
Glucose-Capillary: 179 mg/dL — ABNORMAL HIGH (ref 70–99)
Glucose-Capillary: 335 mg/dL — ABNORMAL HIGH (ref 70–99)
Glucose-Capillary: 410 mg/dL — ABNORMAL HIGH (ref 70–99)
Glucose-Capillary: 50 mg/dL — ABNORMAL LOW (ref 70–99)
Glucose-Capillary: 52 mg/dL — ABNORMAL LOW (ref 70–99)
Glucose-Capillary: 90 mg/dL (ref 70–99)

## 2021-08-11 LAB — HEPATITIS B SURFACE ANTIBODY, QUANTITATIVE: Hep B S AB Quant (Post): 105.7 m[IU]/mL (ref >9.9)

## 2021-08-11 MED ORDER — RAMELTEON 8 MG PO TABS
8.0000 mg | ORAL_TABLET | Freq: Every evening | ORAL | Status: DC | PRN
  Administered 2021-08-11: 8 mg via ORAL
  Filled 2021-08-11 (×2): qty 1

## 2021-08-11 MED ORDER — HYDROMORPHONE HCL 1 MG/ML IJ SOLN
0.5000 mg | Freq: Once | INTRAMUSCULAR | Status: AC
  Administered 2021-08-11: 0.5 mg via INTRAVENOUS
  Filled 2021-08-11: qty 0.5

## 2021-08-11 MED ORDER — INSULIN GLARGINE 100 UNIT/ML SOLOSTAR PEN: 3.0000 [IU] | PEN_INJECTOR | Freq: Every day | SUBCUTANEOUS | 0 refills | Status: DC

## 2021-08-11 MED ORDER — DEXTROSE 50 % IV SOLN
INTRAVENOUS | Status: AC
  Administered 2021-08-11: 25 mL
  Filled 2021-08-11: qty 50

## 2021-08-11 MED ORDER — RAMELTEON 8 MG PO TABS: 8.0000 mg | ORAL_TABLET | Freq: Every day | ORAL | Status: DC

## 2021-08-11 MED ORDER — LIDOCAINE 4 % EX PTCH: 1.0000 | MEDICATED_PATCH | Freq: Two times a day (BID) | CUTANEOUS | 0 refills | Status: AC

## 2021-08-11 MED ORDER — OXYCODONE HCL 5 MG PO TABS: 5.0000 mg | ORAL_TABLET | Freq: Once | ORAL | Status: DC

## 2021-08-11 MED ORDER — MELATONIN 3 MG PO TABS
3.0000 mg | ORAL_TABLET | Freq: Every evening | ORAL | Status: DC | PRN
  Administered 2021-08-11: 3 mg via ORAL
  Filled 2021-08-11: qty 1

## 2021-08-11 MED ORDER — LIDOCAINE 4 % EX PTCH: 1.0000 | MEDICATED_PATCH | Freq: Two times a day (BID) | CUTANEOUS | 0 refills | Status: DC

## 2021-08-11 NOTE — Progress Notes (Signed)
Inpatient Diabetes Program Recommendations  AACE/ADA: New Consensus Statement on Inpatient Glycemic Control (2015)  Target Ranges:  Prepandial:   less than 140 mg/dL      Peak postprandial:   less than 180 mg/dL (1-2 hours)      Critically ill patients:  140 - 180 mg/dL   Lab Results  Component Value Date   GLUCAP 179 (H) 08/11/2021   HGBA1C 9.3 (H) 08/09/2021    Review of Glycemic Control  Diabetes history: DM1, ESRD Outpatient Diabetes medications: Novolog 10 units TID, Novolog SSI Current orders for Inpatient glycemic control: Novolog 0-9 units TID with meals +4 units TID  Inpatient Diabetes Program Recommendations:    Decrease Novolog to 0-6 units TID with meals  Continue to follow.  Thank you. Lorenda Peck, RD, LDN, CDE Inpatient Diabetes Coordinator [phone]

## 2021-08-11 NOTE — Progress Notes (Signed)
[facility] Progress Note   Subjective:   Pt seen on HD, resting w/o c/o's.   Objective Vitals:   08/11/21 0930 08/11/21 1000 08/11/21 1030 08/11/21 1107  BP: (!) 98/42 (!) 88/57 (!) 93/59   Pulse: 91 91 88   Resp: 18   11  Temp:    98.3 F (36.8 C)  TempSrc:      SpO2:      Weight:       Physical Exam General: WDWN male, alert and in NAD Heart: RRR, no murmur Lungs: CTA bilaterally without wheezing or rales Abdomen:  Soft, non-tender, non-distended, +BS Extremities: L BKA, no edema b/l lower extremities Dialysis Access: [facility] with dressing intact  Additional Objective Labs: Basic Metabolic Panel: Recent Labs  Lab 08/09/21 1153 08/09/21 1756 08/09/21 1959 08/10/21 0600 08/10/21 1015 08/11/21 0604  NA 127*   < > 129* 131* 130* 130*  K 5.3*   < > 5.1 3.8 4.1 5.0  CL 88*   < > 95* 95* 93* 90*  CO2 19*   < > 19* 23 24 23   GLUCOSE 331*   < > 73 126* 204* 329*  BUN 65*   < > 67* 26* 28* 44*  CREATININE 8.69*   < > 8.93* 5.18* 5.78* 7.79*  CALCIUM 9.8   < > 9.2 8.7* 9.0 9.4  PHOS 6.8*  --  7.4*  --   --   --    < > = values in this interval not displayed.    No results for input(s): LIPASE, AMYLASE in the last 168 hours. CBC: Recent Labs  Lab 08/08/21 1541 08/09/21 1114 08/09/21 1153 08/11/21 0816  WBC 4.3  --  5.4 3.9*  NEUTROABS 2.7  --   --  2.0  HGB 13.7 14.6 14.4 13.4  HCT 43.9 43.0 45.2 42.9  MCV 91.5  --  90.2 91.3  PLT 105*  --  118* 115*    Medications:   Chlorhexidine Gluconate Cloth  6 each Topical Q0600   Chlorhexidine Gluconate Cloth  6 each Topical Q0600   heparin  5,000 Units Subcutaneous Q8H   insulin aspart  0-5 Units Subcutaneous QHS   insulin aspart  0-9 Units Subcutaneous TID WC   insulin aspart  4 Units Subcutaneous TID WC   oxyCODONE  5 mg Oral Once   sevelamer carbonate  2,400 mg Oral With snacks   sevelamer carbonate  4,000 mg Oral TID with meals    Dialysis Orders: Center: [facility]  on MWF. 180NRe, 4 hours  30min, BFR 400, DFR A1.5, EDW 75kg (last weight was 92.1kg), 2K, 2Ca, TDC, no heparin Calcitriol 1.54mcg PO q HD  Assessment/Plan: Fall: Reported fall while being transported in his wheelchair. CT head and cervical spine were unremarkable. Management per primary team.  Hyperglycemia: Missed his PM insulin prior to admission, blood glucose initially >800 but has come down close to goal. On SQ insulin now.   ESRD: HD MWF. Had abbreviated OP HD Friday due to pain from his fall. Extra HD Saturday with good UF. Back on schedule on HD this am.   Hypertension/volume: Vascular congestion on CXR but O2 saturations were good on room air. Significantly over his EDW, unclear what the true dry wt is for this pt. Had good UF 5 L on Saturday. Max UF w/ HD again today.   Anemia: Hgb at goal, not on ESA.   Metabolic bone disease: Last calcium level ok, phos 6.8. Cont binders  Nutrition: changed diet  to renal/carb modified with fluid restrictions. Dispo: possible dc home today per primary team.    Kelly Splinter, MD 08/11/2021, 1:09 PM

## 2021-08-11 NOTE — Progress Notes (Addendum)
Pt CBG on return to unit 50, per NT [place]. Hypoglycemia protocol initiated.  4 ounces grape juice supplied to patient. Recheck of blood sugar at 10 minute mark 52. Dextrose retrieved from pixis and administered. See MAR.

## 2021-08-11 NOTE — Discharge Summary (Addendum)
Name: [name] MRN: [id number] DOB: [birth year] 40 y.o. PCP: Kerin Perna, NP  Date of Admission: 08/08/2021  3:23 PM Date of Discharge: 08/11/2021 Attending Physician: Velna Ochs, MD  Discharge Diagnosis: 1.  Diabetic ketoacidosis 2.  Type 1 diabetes 3.  Hyperkalemia 4.  End-stage renal disease on dialysis  Discharge Medications: Allergies as of 08/11/2021   No Known Allergies      Medication List     STOP taking these medications    metroNIDAZOLE 500 MG tablet Commonly known as: FLAGYL       TAKE these medications    calcitRIOL 0.5 MCG capsule Commonly known as: ROCALTROL Take 3 capsules (1.5 mcg total) by mouth every Monday, Wednesday, and Friday with hemodialysis.   cinacalcet 60 MG tablet Commonly known as: SENSIPAR Take 120 mg by mouth daily after supper.   gabapentin 100 MG capsule Commonly known as: Neurontin Take 1 capsule (100 mg total) by mouth 3 (three) times daily.   insulin aspart 100 UNIT/ML injection Commonly known as: NovoLOG Inject 10 Units into the skin 3 (three) times daily before meals. For blood sugars 0-150 give 0 units of insulin, 151-200 give 2 units of insulin, 201-250 give 4 units, 251-300 give 6 units, 301-350 give 8 units, 351-400 give 10 units,> 400 give 12 units and call M.D. Discussed hypoglycemia protocol.   Lidocaine 4 % Ptch Commonly known as: HM Lidocaine Patch Apply 1 each topically 2 (two) times daily for 5 days.   multivitamin Tabs tablet Take 1 tablet by mouth at bedtime.   nutrition supplement (JUVEN) Pack Take 1 packet by mouth 2 (two) times daily between meals.   (feeding supplement) PROSource Plus liquid Take 30 mLs by mouth 3 (three) times daily between meals.   OneTouch Delica Plus [id number] Misc 1 each by Other route daily.   OneTouch Verio Flex System w/Device Kit 1 Bag by Does not apply route 3 (three) times daily before meals.   OneTouch Verio test strip Generic drug: glucose  blood USE STRIPS TO CHECK GLUCOSE THREE TIMES DAILY What changed:  how much to take how to take this when to take this   sevelamer carbonate 800 MG tablet Commonly known as: RENVELA Take 2,400-4,000 mg by mouth See admin instructions. Take 4,000 mg (5 tablets) by mouth with meals and 2,400 mg (3 tablets) with snacks         Disposition and follow-up:   [name] was discharged from [facility] in Stable condition.  At the hospital follow up visit please address:  1.   Diabetes ketoacidosis: Patient presented with DKA in the setting of missed insulin while waiting in the ED. He was treated with an insulin drip and was transitioned back to home sliding scale NovoLog TID AC prior to discharge. He has a history of recurrent hypoglycemia due to his ESRD and reduced clearance of insulin. Because he is type 1, we discussed that the long acting insulin will help keep him out of DKA.  However CBG was in the 50s following HD, so we held off on initiating.  We asked that he follow up with his PCP.   Labile Type 1 Diabetes: Please follow-up to see if patient requires basal insulin coverage or if patient's blood sugar had been going too low.  ESRD on dialysis: Please ensure that patient has been appropriately going to dialysis.  Hypertension: Patient's blood pressures have been around 135/78 while in the hospital.  Please make changes as  necessary to patient's blood pressure medications.  Back pain: Patient endorses mid back pain after a fall.  Other imaging came back unremarkable with no fracture. -Reassess his pain at follow-up visit -We prescribed lidocaine patch at discharge  2.  Labs / imaging needed at time of follow-up: none  3.  Pending labs/ test needing follow-up: none  Follow-up Appointments: Patient reports he will set up appointment with PCP following discharge   Hospital Course by problem list: 1.  #Hyperglycemia crisis secondary to DKA #Labile Type  1 Diabetes Mellitus Patient had a mild DKA while in the hospital in the setting of missed insulin doses.  Patient denied nausea, vomiting, any symptoms due to his DKA.  Patient's glucose was >600 at the beginning of admission.  Patient's beta hydroxybutyrate had been elevated to 0.39 on admission and has been downtrending to 0.09.  Patient was started on insulin drip and was continued on this for 1 day prior to anion gap closing on BMP.  Patient was then transition to his home medication regimen of NovoLog 3 times daily with meals.  Patient is not on a basal insulin so this was not started while in the hospital.  Patient refused starting basal insulin while in the hospital as this would delay his discharge.  Discussed the risk/benefits of starting basal insulin and patient verbalized understanding.  It was decided that given blood sugars would run low very quickly within the context of ESRD, we will hold off on adding basal insulin at this time. Patient should closely monitor his blood sugars and continue his home medication regiment for his diabetes as he was doing prior to admission.  #Fall Patient had initially presented from fall with headache, neck pain, and back pain.  CT head without contrast, CT cervical spine without contrast, thoracic spine x-ray, chest x-ray all were performed this admission and showed no acute findings.  Pain was appropriately managed while in the hospital.  Patient did request IV Dilaudid three times while in the hospital due to pain. Patient insists he still has pain "like a migraine", back pain along the spine.  His physical exam is benign with mild pain to palpation of the mid back.  No midline tenderness or neurological symptoms.  Advised patient that we will try to avoid opioids for his mild symptoms.  Patient agreed with lidocaine patch at discharge.  #ESRD Patient is regularly on dialysis Monday Wednesday Friday.  Patient had an abbreviated treatment Friday so had extra  HD on Saturday with good UF.  Patient also had dialysis done 11/14 prior to discharge.  #Hyperkalemia On admission, patient's potassium level was >8.5.  Stat EKG indicated no acute changes.  Patient was started on insulin drip due to DKA and HD to further filter out potassium.  Patient's potassium level has since down trended to normal levels around 4.1.   HPI Patient was seen at dialysis this morning.  He reports doing well with no acute distress.  He denies any complaints.  Patient is eager to go home to make it to his appointment tomorrow.  Discharge Exam:   BP (!) 93/59   Pulse 88   Temp 98.3 F (36.8 C)   Resp 11   Ht '5\' 4"'  (1.626 m)   Wt 91.4 kg   SpO2 99%   BMI 34.59 kg/m  Discharge exam:  Physical Exam Constitutional:      General: He is not in acute distress.    Appearance: Normal appearance. He is not ill-appearing.  Cardiovascular:     Rate and Rhythm: Normal rate and regular rhythm.     Pulses: Normal pulses.     Heart sounds: Normal heart sounds. No murmur heard.   No friction rub. No gallop.  Pulmonary:     Effort: Pulmonary effort is normal.     Breath sounds: Normal breath sounds. No stridor. No wheezing, rhonchi or rales.  Abdominal:     General: Abdomen is flat. Bowel sounds are normal.     Palpations: Abdomen is soft.  Skin:    General: Skin is warm and dry.  Neurological:     Mental Status: He is alert and oriented to person, place, and time. Mental status is at baseline.  Psychiatric:        Mood and Affect: Mood normal.    Pertinent Labs, Studies, and Procedures:  DG Chest 2 View  Result Date: 08/08/2021 CLINICAL DATA:  Concern for fluid overload. Pain with inspiration. Technologist notes state dropped down stairs this morning while being transported to dialysis. Thoracic back pain. EXAM: CHEST - 2 VIEW COMPARISON:  Chest radiograph 07/28/2020 FINDINGS: Right-sided dialysis catheter remains in place. Cardiomegaly is unchanged. Mediastinal contours  are unchanged. Mild vascular congestion without overt edema. Suspect trace bilateral pleural effusions. No focal airspace disease. No pneumothorax. Left axillary vascular stent. No acute osseous abnormalities are seen. Thoracic spine assessed on dedicated thoracic spine radiographs performed concurrently. IMPRESSION: Stable cardiomegaly with vascular congestion. Suspect trace bilateral pleural effusions. Electronically Signed   By: Keith Rake M.D.   On: 08/08/2021 16:42   DG Thoracic Spine 2 View  Result Date: 08/08/2021 CLINICAL DATA:  Patient reports thoracic back pain after being dropped down some stairs this morning while being transported to dialysis. EXAM: THORACIC SPINE 2 VIEWS COMPARISON:  None. FINDINGS: No evidence of acute fracture or compression deformity. Normal alignment. Vertebral body heights are preserved, upper aspect of the thoracic spine is partially obscured on the lateral view. No evidence of focal bone abnormality. No paravertebral soft tissue abnormality. IMPRESSION: Negative radiographs of the thoracic spine. Electronically Signed   By: Keith Rake M.D.   On: 08/08/2021 16:44   CT HEAD WO CONTRAST (5MM)  Result Date: 08/08/2021 CLINICAL DATA:  fall, head injury EXAM: CT HEAD WITHOUT CONTRAST TECHNIQUE: Contiguous axial images were obtained from the base of the skull through the vertex without intravenous contrast. COMPARISON:  05/30/2020. FINDINGS: Brain: No evidence of acute infarction, hemorrhage, hydrocephalus, extra-axial collection or mass lesion/mass effect. Vascular: No hyperdense vessel identified. Skull: No acute fracture. Sinuses/Orbits: Partially imaged inferior left maxillary sinus mucosal thickening. Otherwise, clear sinuses. Unremarkable orbits. Other: No mastoid effusions. IMPRESSION: No evidence of acute intracranial abnormality. Electronically Signed   By: Margaretha Sheffield M.D.   On: 08/08/2021 16:38   CT Cervical Spine Wo Contrast  Result Date:  08/08/2021 CLINICAL DATA:  Provided history: Fall, injury. Additional history provided: Fall today (hitting back of head). Patient reports headache, neck pain, dizziness. EXAM: CT CERVICAL SPINE WITHOUT CONTRAST TECHNIQUE: Multidetector CT imaging of the cervical spine was performed without intravenous contrast. Multiplanar CT image reconstructions were also generated. COMPARISON:  CT cervical spine 05/30/2020. FINDINGS: Alignment: Reversal of mild reversal of the expected cervical lordosis. No significant spondylolisthesis. Skull base and vertebrae: The basion-dental and atlanto-dental intervals are maintained.No evidence of acute fracture to the cervical spine. 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 Soft tissues and spinal canal: No prevertebral fluid or swelling. No visible canal hematoma. Disc levels: No significant  bony spinal canal or neural foraminal narrowing. Upper chest: No consolidation within the imaged lung apices. No visible pneumothorax. Other: Partially imaged right-sided dialysis catheter. Age-advanced arterial vascular calcifications. IMPRESSION: No evidence of acute fracture to the cervical spine. Mild reversal of the expected cervical lordosis. Electronically Signed   By: Kellie Simmering D.O.   On: 08/08/2021 16:44     Discharge Instructions: Discharge Instructions     Call MD for:  difficulty breathing, headache or visual disturbances   Complete by: As directed    Call MD for:  persistant dizziness or light-headedness   Complete by: As directed    Call MD for:  persistant nausea and vomiting   Complete by: As directed    Call MD for:  severe uncontrolled pain   Complete by: As directed    Call MD for:  temperature >100.4   Complete by: As directed    Diet - low sodium heart healthy   Complete by: As directed    Discharge instructions   Complete by: As directed    Dear [name],  It was a pleasure taking care of you while you are in the  hospital.  You were admitted due to diabetic ketoacidosis.  We had you on an insulin drip and transitioned you back to your home medications.  We also had done dialysis for you while you are in the hospital for your ESRD.  Please check your blood sugars regularly and follow up with your primary care provider .  If you have any further questions please contact your primary care provider.  Take care!   Increase activity slowly   Complete by: As directed    No wound care   Complete by: As directed        Signed: Gaylan Gerold, DO 08/11/2021, 5:12 PM   Pager: [phone]

## 2021-08-11 NOTE — Progress Notes (Signed)
Pt requested meal tray be removed form his room while at HD. Pt room tray noted to have several packs of salt, rice krispy treats, full salt chips, small bags of cookies, and a Mt Dew.

## 2021-08-11 NOTE — Progress Notes (Signed)
Hypoglycemic Event  CBG:52  Treatment: 4 oz juice/soda and D50 25 mL (12.5 gm)  Symptoms: None  Follow-up CBG: Time:18 CBG Result:179  Possible Reasons for Event: Inadequate meal intake  Comments/MD notified: Jossie Ng, MD  Leonie Man

## 2021-08-11 NOTE — Care Management (Signed)
ED RNCM contacted concerning arranging transportation home. [facility] contacted and placed on list for transport home.     Wendi Maya RN BSN NCM  [phone]

## 2021-08-11 NOTE — Progress Notes (Signed)
New order received from MD and medication administered to pt. Will continue to closely monitor. Delia Heady RN

## 2021-08-11 NOTE — Progress Notes (Signed)
Pt sleeping medication given effective. Pt transported off unit to dialysis and report given to [facility] RN. Delia Heady RN

## 2021-08-11 NOTE — Progress Notes (Signed)
Pt awake all night, pt report not able to sleep and requested to for something to help him sleep. MD notified and new order received for melatonin. Pt report medication ineffective and want something to make him sleep. MD notified and awaiting on response. Will continue to closely monitor pt. Delia Heady RN

## 2021-08-12 ENCOUNTER — Telehealth: Payer: Self-pay

## 2021-08-12 DIAGNOSIS — I10 Essential (primary) hypertension: Secondary | ICD-10-CM | POA: Diagnosis not present

## 2021-08-12 NOTE — Telephone Encounter (Signed)
Transition Care Management Follow-up Telephone Call Date of discharge and from where: 08/11/2021, [facility]  How have you been since you were released from the hospital? He said he has been taking it easy Any questions or concerns? No  Items Reviewed: Did the pt receive and understand the discharge instructions provided? Yes  Medications obtained and verified? Yes  - he said he has all medications and did not have any questions about his med regime. He has a glucometer Other? No  Any new allergies since your discharge? No  Do you have support at home?  He said periodically he has help.  Attends HD: M/W/F - [name].   Home Care and Equipment/Supplies: Were home health services ordered? no If so, what is the name of the agency? N/a  Has the agency set up a time to come to the patient's home? not applicable Were any new equipment or medical supplies ordered?  No What is the name of the medical supply agency? N/a Were you able to get the supplies/equipment? not applicable Do you have any questions related to the use of the equipment or supplies? No  Functional Questionnaire: (I = Independent and D = Dependent) ADLs: uses wheelchair for mobility.  Independent with personal care. Needs to be fit for LLE prosthesis.    Follow up appointments reviewed:  PCP Hospital f/u appt confirmed? No  He said he will call to schedule an appointment when needed  [facility] f/u appt confirmed?  None scheduled at this time.  Are transportation arrangements needed? No - he uses [facility] If their condition worsens, is the pt aware to call PCP or go to the Emergency Dept.? Yes Was the patient provided with contact information for the PCP's office or ED? Yes Was to pt encouraged to call back with questions or concerns? Yes

## 2021-08-13 DIAGNOSIS — N2581 Secondary hyperparathyroidism of renal origin: Secondary | ICD-10-CM | POA: Diagnosis not present

## 2021-08-13 DIAGNOSIS — Z7401 Bed confinement status: Secondary | ICD-10-CM | POA: Diagnosis not present

## 2021-08-13 DIAGNOSIS — T8249XA Other complication of vascular dialysis catheter, initial encounter: Secondary | ICD-10-CM | POA: Diagnosis not present

## 2021-08-13 DIAGNOSIS — Z992 Dependence on renal dialysis: Secondary | ICD-10-CM | POA: Diagnosis not present

## 2021-08-13 DIAGNOSIS — R531 Weakness: Secondary | ICD-10-CM | POA: Diagnosis not present

## 2021-08-13 DIAGNOSIS — I10 Essential (primary) hypertension: Secondary | ICD-10-CM | POA: Diagnosis not present

## 2021-08-13 DIAGNOSIS — N186 End stage renal disease: Secondary | ICD-10-CM | POA: Diagnosis not present

## 2021-08-13 DIAGNOSIS — D689 Coagulation defect, unspecified: Secondary | ICD-10-CM | POA: Diagnosis not present

## 2021-08-13 NOTE — Telephone Encounter (Signed)
noted 

## 2021-08-14 DIAGNOSIS — I10 Essential (primary) hypertension: Secondary | ICD-10-CM | POA: Diagnosis not present

## 2021-08-14 DIAGNOSIS — Z89612 Acquired absence of left leg above knee: Secondary | ICD-10-CM | POA: Diagnosis not present

## 2021-08-14 DIAGNOSIS — R531 Weakness: Secondary | ICD-10-CM | POA: Diagnosis not present

## 2021-08-14 DIAGNOSIS — Z4781 Encounter for orthopedic aftercare following surgical amputation: Secondary | ICD-10-CM | POA: Diagnosis not present

## 2021-08-15 DIAGNOSIS — W19XXXA Unspecified fall, initial encounter: Secondary | ICD-10-CM | POA: Diagnosis not present

## 2021-08-15 DIAGNOSIS — Z992 Dependence on renal dialysis: Secondary | ICD-10-CM | POA: Diagnosis not present

## 2021-08-15 DIAGNOSIS — D689 Coagulation defect, unspecified: Secondary | ICD-10-CM | POA: Diagnosis not present

## 2021-08-15 DIAGNOSIS — T8249XA Other complication of vascular dialysis catheter, initial encounter: Secondary | ICD-10-CM | POA: Diagnosis not present

## 2021-08-15 DIAGNOSIS — Z743 Need for continuous supervision: Secondary | ICD-10-CM | POA: Diagnosis not present

## 2021-08-15 DIAGNOSIS — Z7401 Bed confinement status: Secondary | ICD-10-CM | POA: Diagnosis not present

## 2021-08-15 DIAGNOSIS — R531 Weakness: Secondary | ICD-10-CM | POA: Diagnosis not present

## 2021-08-15 DIAGNOSIS — N186 End stage renal disease: Secondary | ICD-10-CM | POA: Diagnosis not present

## 2021-08-15 DIAGNOSIS — I10 Essential (primary) hypertension: Secondary | ICD-10-CM | POA: Diagnosis not present

## 2021-08-15 DIAGNOSIS — N2581 Secondary hyperparathyroidism of renal origin: Secondary | ICD-10-CM | POA: Diagnosis not present

## 2021-08-16 DIAGNOSIS — I10 Essential (primary) hypertension: Secondary | ICD-10-CM | POA: Diagnosis not present

## 2021-08-17 DIAGNOSIS — I10 Essential (primary) hypertension: Secondary | ICD-10-CM | POA: Diagnosis not present

## 2021-08-18 DIAGNOSIS — Z7401 Bed confinement status: Secondary | ICD-10-CM | POA: Diagnosis not present

## 2021-08-18 DIAGNOSIS — Z743 Need for continuous supervision: Secondary | ICD-10-CM | POA: Diagnosis not present

## 2021-08-18 DIAGNOSIS — T8249XA Other complication of vascular dialysis catheter, initial encounter: Secondary | ICD-10-CM | POA: Diagnosis not present

## 2021-08-18 DIAGNOSIS — N186 End stage renal disease: Secondary | ICD-10-CM | POA: Diagnosis not present

## 2021-08-18 DIAGNOSIS — I10 Essential (primary) hypertension: Secondary | ICD-10-CM | POA: Diagnosis not present

## 2021-08-18 DIAGNOSIS — N2581 Secondary hyperparathyroidism of renal origin: Secondary | ICD-10-CM | POA: Diagnosis not present

## 2021-08-18 DIAGNOSIS — D689 Coagulation defect, unspecified: Secondary | ICD-10-CM | POA: Diagnosis not present

## 2021-08-18 DIAGNOSIS — R531 Weakness: Secondary | ICD-10-CM | POA: Diagnosis not present

## 2021-08-18 DIAGNOSIS — Z992 Dependence on renal dialysis: Secondary | ICD-10-CM | POA: Diagnosis not present

## 2021-08-19 DIAGNOSIS — I10 Essential (primary) hypertension: Secondary | ICD-10-CM | POA: Diagnosis not present

## 2021-08-20 DIAGNOSIS — Z7401 Bed confinement status: Secondary | ICD-10-CM | POA: Diagnosis not present

## 2021-08-20 DIAGNOSIS — R531 Weakness: Secondary | ICD-10-CM | POA: Diagnosis not present

## 2021-08-20 DIAGNOSIS — Z992 Dependence on renal dialysis: Secondary | ICD-10-CM | POA: Diagnosis not present

## 2021-08-20 DIAGNOSIS — N186 End stage renal disease: Secondary | ICD-10-CM | POA: Diagnosis not present

## 2021-08-20 DIAGNOSIS — N2581 Secondary hyperparathyroidism of renal origin: Secondary | ICD-10-CM | POA: Diagnosis not present

## 2021-08-20 DIAGNOSIS — Z743 Need for continuous supervision: Secondary | ICD-10-CM | POA: Diagnosis not present

## 2021-08-20 DIAGNOSIS — I10 Essential (primary) hypertension: Secondary | ICD-10-CM | POA: Diagnosis not present

## 2021-08-20 DIAGNOSIS — D689 Coagulation defect, unspecified: Secondary | ICD-10-CM | POA: Diagnosis not present

## 2021-08-20 DIAGNOSIS — T8249XA Other complication of vascular dialysis catheter, initial encounter: Secondary | ICD-10-CM | POA: Diagnosis not present

## 2021-08-21 DIAGNOSIS — I10 Essential (primary) hypertension: Secondary | ICD-10-CM | POA: Diagnosis not present

## 2021-08-22 DIAGNOSIS — I10 Essential (primary) hypertension: Secondary | ICD-10-CM | POA: Diagnosis not present

## 2021-08-23 DIAGNOSIS — R531 Weakness: Secondary | ICD-10-CM | POA: Diagnosis not present

## 2021-08-23 DIAGNOSIS — Z992 Dependence on renal dialysis: Secondary | ICD-10-CM | POA: Diagnosis not present

## 2021-08-23 DIAGNOSIS — N186 End stage renal disease: Secondary | ICD-10-CM | POA: Diagnosis not present

## 2021-08-23 DIAGNOSIS — Z743 Need for continuous supervision: Secondary | ICD-10-CM | POA: Diagnosis not present

## 2021-08-23 DIAGNOSIS — I10 Essential (primary) hypertension: Secondary | ICD-10-CM | POA: Diagnosis not present

## 2021-08-23 DIAGNOSIS — T8249XA Other complication of vascular dialysis catheter, initial encounter: Secondary | ICD-10-CM | POA: Diagnosis not present

## 2021-08-23 DIAGNOSIS — D689 Coagulation defect, unspecified: Secondary | ICD-10-CM | POA: Diagnosis not present

## 2021-08-23 DIAGNOSIS — N2581 Secondary hyperparathyroidism of renal origin: Secondary | ICD-10-CM | POA: Diagnosis not present

## 2021-08-23 DIAGNOSIS — Z7401 Bed confinement status: Secondary | ICD-10-CM | POA: Diagnosis not present

## 2021-08-24 DIAGNOSIS — M549 Dorsalgia, unspecified: Secondary | ICD-10-CM | POA: Diagnosis not present

## 2021-08-24 DIAGNOSIS — T8249XA Other complication of vascular dialysis catheter, initial encounter: Secondary | ICD-10-CM | POA: Diagnosis not present

## 2021-08-24 DIAGNOSIS — Z7401 Bed confinement status: Secondary | ICD-10-CM | POA: Diagnosis not present

## 2021-08-24 DIAGNOSIS — Z992 Dependence on renal dialysis: Secondary | ICD-10-CM | POA: Diagnosis not present

## 2021-08-24 DIAGNOSIS — I10 Essential (primary) hypertension: Secondary | ICD-10-CM | POA: Diagnosis not present

## 2021-08-24 DIAGNOSIS — N186 End stage renal disease: Secondary | ICD-10-CM | POA: Diagnosis not present

## 2021-08-24 DIAGNOSIS — N2581 Secondary hyperparathyroidism of renal origin: Secondary | ICD-10-CM | POA: Diagnosis not present

## 2021-08-24 DIAGNOSIS — D689 Coagulation defect, unspecified: Secondary | ICD-10-CM | POA: Diagnosis not present

## 2021-08-25 DIAGNOSIS — I10 Essential (primary) hypertension: Secondary | ICD-10-CM | POA: Diagnosis not present

## 2021-08-26 ENCOUNTER — Other Ambulatory Visit (INDEPENDENT_AMBULATORY_CARE_PROVIDER_SITE_OTHER): Payer: Self-pay | Admitting: Primary Care

## 2021-08-26 DIAGNOSIS — I10 Essential (primary) hypertension: Secondary | ICD-10-CM | POA: Diagnosis not present

## 2021-08-26 DIAGNOSIS — R531 Weakness: Secondary | ICD-10-CM | POA: Diagnosis not present

## 2021-08-26 DIAGNOSIS — Z7401 Bed confinement status: Secondary | ICD-10-CM | POA: Diagnosis not present

## 2021-08-27 DIAGNOSIS — T8249XA Other complication of vascular dialysis catheter, initial encounter: Secondary | ICD-10-CM | POA: Diagnosis not present

## 2021-08-27 DIAGNOSIS — I129 Hypertensive chronic kidney disease with stage 1 through stage 4 chronic kidney disease, or unspecified chronic kidney disease: Secondary | ICD-10-CM | POA: Diagnosis not present

## 2021-08-27 DIAGNOSIS — Z992 Dependence on renal dialysis: Secondary | ICD-10-CM | POA: Diagnosis not present

## 2021-08-27 DIAGNOSIS — I10 Essential (primary) hypertension: Secondary | ICD-10-CM | POA: Diagnosis not present

## 2021-08-27 DIAGNOSIS — R531 Weakness: Secondary | ICD-10-CM | POA: Diagnosis not present

## 2021-08-27 DIAGNOSIS — N186 End stage renal disease: Secondary | ICD-10-CM | POA: Diagnosis not present

## 2021-08-27 DIAGNOSIS — D689 Coagulation defect, unspecified: Secondary | ICD-10-CM | POA: Diagnosis not present

## 2021-08-27 DIAGNOSIS — N2581 Secondary hyperparathyroidism of renal origin: Secondary | ICD-10-CM | POA: Diagnosis not present

## 2021-08-27 DIAGNOSIS — Z7401 Bed confinement status: Secondary | ICD-10-CM | POA: Diagnosis not present

## 2021-08-27 NOTE — Telephone Encounter (Signed)
Sent to PCP ?

## 2021-08-28 DIAGNOSIS — I10 Essential (primary) hypertension: Secondary | ICD-10-CM | POA: Diagnosis not present

## 2021-08-29 DIAGNOSIS — Z992 Dependence on renal dialysis: Secondary | ICD-10-CM | POA: Diagnosis not present

## 2021-08-29 DIAGNOSIS — N2581 Secondary hyperparathyroidism of renal origin: Secondary | ICD-10-CM | POA: Diagnosis not present

## 2021-08-29 DIAGNOSIS — Z743 Need for continuous supervision: Secondary | ICD-10-CM | POA: Diagnosis not present

## 2021-08-29 DIAGNOSIS — T8249XA Other complication of vascular dialysis catheter, initial encounter: Secondary | ICD-10-CM | POA: Diagnosis not present

## 2021-08-29 DIAGNOSIS — R531 Weakness: Secondary | ICD-10-CM | POA: Diagnosis not present

## 2021-08-29 DIAGNOSIS — D689 Coagulation defect, unspecified: Secondary | ICD-10-CM | POA: Diagnosis not present

## 2021-08-29 DIAGNOSIS — N186 End stage renal disease: Secondary | ICD-10-CM | POA: Diagnosis not present

## 2021-08-29 DIAGNOSIS — Z7401 Bed confinement status: Secondary | ICD-10-CM | POA: Diagnosis not present

## 2021-08-29 DIAGNOSIS — I10 Essential (primary) hypertension: Secondary | ICD-10-CM | POA: Diagnosis not present

## 2021-08-30 DIAGNOSIS — I10 Essential (primary) hypertension: Secondary | ICD-10-CM | POA: Diagnosis not present

## 2021-08-31 DIAGNOSIS — I10 Essential (primary) hypertension: Secondary | ICD-10-CM | POA: Diagnosis not present

## 2021-09-01 DIAGNOSIS — R531 Weakness: Secondary | ICD-10-CM | POA: Diagnosis not present

## 2021-09-01 DIAGNOSIS — Z7401 Bed confinement status: Secondary | ICD-10-CM | POA: Diagnosis not present

## 2021-09-01 DIAGNOSIS — Z743 Need for continuous supervision: Secondary | ICD-10-CM | POA: Diagnosis not present

## 2021-09-01 DIAGNOSIS — D689 Coagulation defect, unspecified: Secondary | ICD-10-CM | POA: Diagnosis not present

## 2021-09-01 DIAGNOSIS — Z992 Dependence on renal dialysis: Secondary | ICD-10-CM | POA: Diagnosis not present

## 2021-09-01 DIAGNOSIS — N186 End stage renal disease: Secondary | ICD-10-CM | POA: Diagnosis not present

## 2021-09-01 DIAGNOSIS — N2581 Secondary hyperparathyroidism of renal origin: Secondary | ICD-10-CM | POA: Diagnosis not present

## 2021-09-01 DIAGNOSIS — T8249XA Other complication of vascular dialysis catheter, initial encounter: Secondary | ICD-10-CM | POA: Diagnosis not present

## 2021-09-01 DIAGNOSIS — I10 Essential (primary) hypertension: Secondary | ICD-10-CM | POA: Diagnosis not present

## 2021-09-02 DIAGNOSIS — I10 Essential (primary) hypertension: Secondary | ICD-10-CM | POA: Diagnosis not present

## 2021-09-03 DIAGNOSIS — Z7401 Bed confinement status: Secondary | ICD-10-CM | POA: Diagnosis not present

## 2021-09-03 DIAGNOSIS — Z743 Need for continuous supervision: Secondary | ICD-10-CM | POA: Diagnosis not present

## 2021-09-03 DIAGNOSIS — Z992 Dependence on renal dialysis: Secondary | ICD-10-CM | POA: Diagnosis not present

## 2021-09-03 DIAGNOSIS — D689 Coagulation defect, unspecified: Secondary | ICD-10-CM | POA: Diagnosis not present

## 2021-09-03 DIAGNOSIS — R531 Weakness: Secondary | ICD-10-CM | POA: Diagnosis not present

## 2021-09-03 DIAGNOSIS — N186 End stage renal disease: Secondary | ICD-10-CM | POA: Diagnosis not present

## 2021-09-03 DIAGNOSIS — T8249XA Other complication of vascular dialysis catheter, initial encounter: Secondary | ICD-10-CM | POA: Diagnosis not present

## 2021-09-03 DIAGNOSIS — N2581 Secondary hyperparathyroidism of renal origin: Secondary | ICD-10-CM | POA: Diagnosis not present

## 2021-09-03 DIAGNOSIS — I10 Essential (primary) hypertension: Secondary | ICD-10-CM | POA: Diagnosis not present

## 2021-09-04 DIAGNOSIS — I10 Essential (primary) hypertension: Secondary | ICD-10-CM | POA: Diagnosis not present

## 2021-09-05 DIAGNOSIS — D689 Coagulation defect, unspecified: Secondary | ICD-10-CM | POA: Diagnosis not present

## 2021-09-05 DIAGNOSIS — N186 End stage renal disease: Secondary | ICD-10-CM | POA: Diagnosis not present

## 2021-09-05 DIAGNOSIS — N2581 Secondary hyperparathyroidism of renal origin: Secondary | ICD-10-CM | POA: Diagnosis not present

## 2021-09-05 DIAGNOSIS — Z743 Need for continuous supervision: Secondary | ICD-10-CM | POA: Diagnosis not present

## 2021-09-05 DIAGNOSIS — I10 Essential (primary) hypertension: Secondary | ICD-10-CM | POA: Diagnosis not present

## 2021-09-05 DIAGNOSIS — Z7401 Bed confinement status: Secondary | ICD-10-CM | POA: Diagnosis not present

## 2021-09-05 DIAGNOSIS — R531 Weakness: Secondary | ICD-10-CM | POA: Diagnosis not present

## 2021-09-05 DIAGNOSIS — T8249XA Other complication of vascular dialysis catheter, initial encounter: Secondary | ICD-10-CM | POA: Diagnosis not present

## 2021-09-05 DIAGNOSIS — Z992 Dependence on renal dialysis: Secondary | ICD-10-CM | POA: Diagnosis not present

## 2021-09-06 DIAGNOSIS — I10 Essential (primary) hypertension: Secondary | ICD-10-CM | POA: Diagnosis not present

## 2021-09-07 DIAGNOSIS — I10 Essential (primary) hypertension: Secondary | ICD-10-CM | POA: Diagnosis not present

## 2021-09-08 DIAGNOSIS — I10 Essential (primary) hypertension: Secondary | ICD-10-CM | POA: Diagnosis not present

## 2021-09-08 DIAGNOSIS — N186 End stage renal disease: Secondary | ICD-10-CM | POA: Diagnosis not present

## 2021-09-08 DIAGNOSIS — D631 Anemia in chronic kidney disease: Secondary | ICD-10-CM | POA: Diagnosis not present

## 2021-09-08 DIAGNOSIS — R531 Weakness: Secondary | ICD-10-CM | POA: Diagnosis not present

## 2021-09-08 DIAGNOSIS — N2581 Secondary hyperparathyroidism of renal origin: Secondary | ICD-10-CM | POA: Diagnosis not present

## 2021-09-08 DIAGNOSIS — D689 Coagulation defect, unspecified: Secondary | ICD-10-CM | POA: Diagnosis not present

## 2021-09-08 DIAGNOSIS — Z7401 Bed confinement status: Secondary | ICD-10-CM | POA: Diagnosis not present

## 2021-09-08 DIAGNOSIS — T8249XA Other complication of vascular dialysis catheter, initial encounter: Secondary | ICD-10-CM | POA: Diagnosis not present

## 2021-09-08 DIAGNOSIS — Z992 Dependence on renal dialysis: Secondary | ICD-10-CM | POA: Diagnosis not present

## 2021-09-08 DIAGNOSIS — Z743 Need for continuous supervision: Secondary | ICD-10-CM | POA: Diagnosis not present

## 2021-09-09 DIAGNOSIS — Z89612 Acquired absence of left leg above knee: Secondary | ICD-10-CM | POA: Diagnosis not present

## 2021-09-09 DIAGNOSIS — I10 Essential (primary) hypertension: Secondary | ICD-10-CM | POA: Diagnosis not present

## 2021-09-09 DIAGNOSIS — R531 Weakness: Secondary | ICD-10-CM | POA: Diagnosis not present

## 2021-09-09 DIAGNOSIS — Z7401 Bed confinement status: Secondary | ICD-10-CM | POA: Diagnosis not present

## 2021-09-10 DIAGNOSIS — T8249XA Other complication of vascular dialysis catheter, initial encounter: Secondary | ICD-10-CM | POA: Diagnosis not present

## 2021-09-10 DIAGNOSIS — N186 End stage renal disease: Secondary | ICD-10-CM | POA: Diagnosis not present

## 2021-09-10 DIAGNOSIS — D689 Coagulation defect, unspecified: Secondary | ICD-10-CM | POA: Diagnosis not present

## 2021-09-10 DIAGNOSIS — R531 Weakness: Secondary | ICD-10-CM | POA: Diagnosis not present

## 2021-09-10 DIAGNOSIS — D631 Anemia in chronic kidney disease: Secondary | ICD-10-CM | POA: Diagnosis not present

## 2021-09-10 DIAGNOSIS — Z7401 Bed confinement status: Secondary | ICD-10-CM | POA: Diagnosis not present

## 2021-09-10 DIAGNOSIS — N2581 Secondary hyperparathyroidism of renal origin: Secondary | ICD-10-CM | POA: Diagnosis not present

## 2021-09-10 DIAGNOSIS — I10 Essential (primary) hypertension: Secondary | ICD-10-CM | POA: Diagnosis not present

## 2021-09-10 DIAGNOSIS — Z992 Dependence on renal dialysis: Secondary | ICD-10-CM | POA: Diagnosis not present

## 2021-09-10 DIAGNOSIS — Z743 Need for continuous supervision: Secondary | ICD-10-CM | POA: Diagnosis not present

## 2021-09-11 DIAGNOSIS — I10 Essential (primary) hypertension: Secondary | ICD-10-CM | POA: Diagnosis not present

## 2021-09-12 DIAGNOSIS — T8249XA Other complication of vascular dialysis catheter, initial encounter: Secondary | ICD-10-CM | POA: Diagnosis not present

## 2021-09-12 DIAGNOSIS — Z743 Need for continuous supervision: Secondary | ICD-10-CM | POA: Diagnosis not present

## 2021-09-12 DIAGNOSIS — I10 Essential (primary) hypertension: Secondary | ICD-10-CM | POA: Diagnosis not present

## 2021-09-12 DIAGNOSIS — D689 Coagulation defect, unspecified: Secondary | ICD-10-CM | POA: Diagnosis not present

## 2021-09-12 DIAGNOSIS — Z7401 Bed confinement status: Secondary | ICD-10-CM | POA: Diagnosis not present

## 2021-09-12 DIAGNOSIS — N186 End stage renal disease: Secondary | ICD-10-CM | POA: Diagnosis not present

## 2021-09-12 DIAGNOSIS — Z992 Dependence on renal dialysis: Secondary | ICD-10-CM | POA: Diagnosis not present

## 2021-09-12 DIAGNOSIS — R531 Weakness: Secondary | ICD-10-CM | POA: Diagnosis not present

## 2021-09-12 DIAGNOSIS — D631 Anemia in chronic kidney disease: Secondary | ICD-10-CM | POA: Diagnosis not present

## 2021-09-12 DIAGNOSIS — N2581 Secondary hyperparathyroidism of renal origin: Secondary | ICD-10-CM | POA: Diagnosis not present

## 2021-09-13 DIAGNOSIS — I10 Essential (primary) hypertension: Secondary | ICD-10-CM | POA: Diagnosis not present

## 2021-09-14 DIAGNOSIS — I10 Essential (primary) hypertension: Secondary | ICD-10-CM | POA: Diagnosis not present

## 2021-09-15 DIAGNOSIS — Z743 Need for continuous supervision: Secondary | ICD-10-CM | POA: Diagnosis not present

## 2021-09-15 DIAGNOSIS — T8249XA Other complication of vascular dialysis catheter, initial encounter: Secondary | ICD-10-CM | POA: Diagnosis not present

## 2021-09-15 DIAGNOSIS — I10 Essential (primary) hypertension: Secondary | ICD-10-CM | POA: Diagnosis not present

## 2021-09-15 DIAGNOSIS — Z7401 Bed confinement status: Secondary | ICD-10-CM | POA: Diagnosis not present

## 2021-09-15 DIAGNOSIS — R531 Weakness: Secondary | ICD-10-CM | POA: Diagnosis not present

## 2021-09-15 DIAGNOSIS — Z992 Dependence on renal dialysis: Secondary | ICD-10-CM | POA: Diagnosis not present

## 2021-09-15 DIAGNOSIS — E8770 Fluid overload, unspecified: Secondary | ICD-10-CM | POA: Diagnosis not present

## 2021-09-15 DIAGNOSIS — N186 End stage renal disease: Secondary | ICD-10-CM | POA: Diagnosis not present

## 2021-09-15 DIAGNOSIS — N2581 Secondary hyperparathyroidism of renal origin: Secondary | ICD-10-CM | POA: Diagnosis not present

## 2021-09-15 DIAGNOSIS — D689 Coagulation defect, unspecified: Secondary | ICD-10-CM | POA: Diagnosis not present

## 2021-09-16 DIAGNOSIS — I10 Essential (primary) hypertension: Secondary | ICD-10-CM | POA: Diagnosis not present

## 2021-09-17 DIAGNOSIS — Z992 Dependence on renal dialysis: Secondary | ICD-10-CM | POA: Diagnosis not present

## 2021-09-17 DIAGNOSIS — I10 Essential (primary) hypertension: Secondary | ICD-10-CM | POA: Diagnosis not present

## 2021-09-17 DIAGNOSIS — Z7401 Bed confinement status: Secondary | ICD-10-CM | POA: Diagnosis not present

## 2021-09-17 DIAGNOSIS — T8249XA Other complication of vascular dialysis catheter, initial encounter: Secondary | ICD-10-CM | POA: Diagnosis not present

## 2021-09-17 DIAGNOSIS — Z743 Need for continuous supervision: Secondary | ICD-10-CM | POA: Diagnosis not present

## 2021-09-17 DIAGNOSIS — N2581 Secondary hyperparathyroidism of renal origin: Secondary | ICD-10-CM | POA: Diagnosis not present

## 2021-09-17 DIAGNOSIS — R531 Weakness: Secondary | ICD-10-CM | POA: Diagnosis not present

## 2021-09-17 DIAGNOSIS — E8770 Fluid overload, unspecified: Secondary | ICD-10-CM | POA: Diagnosis not present

## 2021-09-17 DIAGNOSIS — N186 End stage renal disease: Secondary | ICD-10-CM | POA: Diagnosis not present

## 2021-09-17 DIAGNOSIS — D689 Coagulation defect, unspecified: Secondary | ICD-10-CM | POA: Diagnosis not present

## 2021-09-18 DIAGNOSIS — Z743 Need for continuous supervision: Secondary | ICD-10-CM | POA: Diagnosis not present

## 2021-09-18 DIAGNOSIS — T8249XA Other complication of vascular dialysis catheter, initial encounter: Secondary | ICD-10-CM | POA: Diagnosis not present

## 2021-09-18 DIAGNOSIS — Z7401 Bed confinement status: Secondary | ICD-10-CM | POA: Diagnosis not present

## 2021-09-18 DIAGNOSIS — D689 Coagulation defect, unspecified: Secondary | ICD-10-CM | POA: Diagnosis not present

## 2021-09-18 DIAGNOSIS — N2581 Secondary hyperparathyroidism of renal origin: Secondary | ICD-10-CM | POA: Diagnosis not present

## 2021-09-18 DIAGNOSIS — N186 End stage renal disease: Secondary | ICD-10-CM | POA: Diagnosis not present

## 2021-09-18 DIAGNOSIS — Z992 Dependence on renal dialysis: Secondary | ICD-10-CM | POA: Diagnosis not present

## 2021-09-18 DIAGNOSIS — I10 Essential (primary) hypertension: Secondary | ICD-10-CM | POA: Diagnosis not present

## 2021-09-18 DIAGNOSIS — R531 Weakness: Secondary | ICD-10-CM | POA: Diagnosis not present

## 2021-09-18 DIAGNOSIS — E8770 Fluid overload, unspecified: Secondary | ICD-10-CM | POA: Diagnosis not present

## 2021-09-19 DIAGNOSIS — N2581 Secondary hyperparathyroidism of renal origin: Secondary | ICD-10-CM | POA: Diagnosis not present

## 2021-09-19 DIAGNOSIS — Z992 Dependence on renal dialysis: Secondary | ICD-10-CM | POA: Diagnosis not present

## 2021-09-19 DIAGNOSIS — N186 End stage renal disease: Secondary | ICD-10-CM | POA: Diagnosis not present

## 2021-09-19 DIAGNOSIS — Z743 Need for continuous supervision: Secondary | ICD-10-CM | POA: Diagnosis not present

## 2021-09-19 DIAGNOSIS — R531 Weakness: Secondary | ICD-10-CM | POA: Diagnosis not present

## 2021-09-19 DIAGNOSIS — I10 Essential (primary) hypertension: Secondary | ICD-10-CM | POA: Diagnosis not present

## 2021-09-19 DIAGNOSIS — E8770 Fluid overload, unspecified: Secondary | ICD-10-CM | POA: Diagnosis not present

## 2021-09-19 DIAGNOSIS — T8249XA Other complication of vascular dialysis catheter, initial encounter: Secondary | ICD-10-CM | POA: Diagnosis not present

## 2021-09-19 DIAGNOSIS — D689 Coagulation defect, unspecified: Secondary | ICD-10-CM | POA: Diagnosis not present

## 2021-09-20 DIAGNOSIS — I10 Essential (primary) hypertension: Secondary | ICD-10-CM | POA: Diagnosis not present

## 2021-09-21 DIAGNOSIS — I10 Essential (primary) hypertension: Secondary | ICD-10-CM | POA: Diagnosis not present

## 2021-09-22 DIAGNOSIS — N2581 Secondary hyperparathyroidism of renal origin: Secondary | ICD-10-CM | POA: Diagnosis not present

## 2021-09-22 DIAGNOSIS — E11621 Type 2 diabetes mellitus with foot ulcer: Secondary | ICD-10-CM | POA: Diagnosis not present

## 2021-09-22 DIAGNOSIS — E669 Obesity, unspecified: Secondary | ICD-10-CM | POA: Diagnosis not present

## 2021-09-22 DIAGNOSIS — G8929 Other chronic pain: Secondary | ICD-10-CM | POA: Diagnosis not present

## 2021-09-22 DIAGNOSIS — D689 Coagulation defect, unspecified: Secondary | ICD-10-CM | POA: Diagnosis not present

## 2021-09-22 DIAGNOSIS — Z7401 Bed confinement status: Secondary | ICD-10-CM | POA: Diagnosis not present

## 2021-09-22 DIAGNOSIS — I10 Essential (primary) hypertension: Secondary | ICD-10-CM | POA: Diagnosis not present

## 2021-09-22 DIAGNOSIS — Z743 Need for continuous supervision: Secondary | ICD-10-CM | POA: Diagnosis not present

## 2021-09-22 DIAGNOSIS — N186 End stage renal disease: Secondary | ICD-10-CM | POA: Diagnosis not present

## 2021-09-22 DIAGNOSIS — Z992 Dependence on renal dialysis: Secondary | ICD-10-CM | POA: Diagnosis not present

## 2021-09-22 DIAGNOSIS — T8249XA Other complication of vascular dialysis catheter, initial encounter: Secondary | ICD-10-CM | POA: Diagnosis not present

## 2021-09-23 ENCOUNTER — Other Ambulatory Visit (INDEPENDENT_AMBULATORY_CARE_PROVIDER_SITE_OTHER): Payer: Self-pay | Admitting: Primary Care

## 2021-09-23 DIAGNOSIS — I10 Essential (primary) hypertension: Secondary | ICD-10-CM | POA: Diagnosis not present

## 2021-09-23 NOTE — Telephone Encounter (Signed)
Sent to PCP ?

## 2021-09-23 NOTE — Telephone Encounter (Signed)
Medication Refill - Medication: gabapentin (NEURONTIN)   Has the patient contacted their pharmacy? Yes.    (Agent: If yes, when and what did the pharmacy advise?) Pharmacy was told contact pcp Preferred Pharmacy (with phone number or street name):  [name]  [place], [place] [address]  Phone:  [phone]  Fax:  [phone] Has the patient been seen for an appointment in the last year OR does the patient have an upcoming appointment? Yes.

## 2021-09-24 DIAGNOSIS — E11621 Type 2 diabetes mellitus with foot ulcer: Secondary | ICD-10-CM | POA: Diagnosis not present

## 2021-09-24 DIAGNOSIS — Z992 Dependence on renal dialysis: Secondary | ICD-10-CM | POA: Diagnosis not present

## 2021-09-24 DIAGNOSIS — N186 End stage renal disease: Secondary | ICD-10-CM | POA: Diagnosis not present

## 2021-09-24 DIAGNOSIS — Z743 Need for continuous supervision: Secondary | ICD-10-CM | POA: Diagnosis not present

## 2021-09-24 DIAGNOSIS — T8249XA Other complication of vascular dialysis catheter, initial encounter: Secondary | ICD-10-CM | POA: Diagnosis not present

## 2021-09-24 DIAGNOSIS — R531 Weakness: Secondary | ICD-10-CM | POA: Diagnosis not present

## 2021-09-24 DIAGNOSIS — I10 Essential (primary) hypertension: Secondary | ICD-10-CM | POA: Diagnosis not present

## 2021-09-24 DIAGNOSIS — N2581 Secondary hyperparathyroidism of renal origin: Secondary | ICD-10-CM | POA: Diagnosis not present

## 2021-09-24 DIAGNOSIS — Z7401 Bed confinement status: Secondary | ICD-10-CM | POA: Diagnosis not present

## 2021-09-24 DIAGNOSIS — D689 Coagulation defect, unspecified: Secondary | ICD-10-CM | POA: Diagnosis not present

## 2021-09-24 NOTE — Telephone Encounter (Signed)
Requested Prescriptions  Pending Prescriptions Disp Refills   gabapentin (NEURONTIN) 100 MG capsule [Pharmacy Med Name: Gabapentin 100 MG Oral Capsule] 90 capsule 0    Sig: TAKE 1 CAPSULE BY MOUTH THREE TIMES DAILY     Neurology: Anticonvulsants - gabapentin Passed - 09/23/2021  4:31 PM      Passed - Valid encounter within last 12 months    Recent Outpatient Visits          6 months ago Hx of above knee amputation, left ([place])   [place] [facility] Juluis Mire P, NP   7 months ago Type 1 diabetes mellitus with other circulatory complication ([place])   [place] [facility] Kerin Perna, NP   1 year ago Type 1 diabetes mellitus with other circulatory complication ([place])   [place] [facility] Kerin Perna, NP   2 years ago Need for Tdap vaccination   [place] Kerin Perna, NP

## 2021-09-25 DIAGNOSIS — I10 Essential (primary) hypertension: Secondary | ICD-10-CM | POA: Diagnosis not present

## 2021-09-26 DIAGNOSIS — T8249XA Other complication of vascular dialysis catheter, initial encounter: Secondary | ICD-10-CM | POA: Diagnosis not present

## 2021-09-26 DIAGNOSIS — R5381 Other malaise: Secondary | ICD-10-CM | POA: Diagnosis not present

## 2021-09-26 DIAGNOSIS — N186 End stage renal disease: Secondary | ICD-10-CM | POA: Diagnosis not present

## 2021-09-26 DIAGNOSIS — D689 Coagulation defect, unspecified: Secondary | ICD-10-CM | POA: Diagnosis not present

## 2021-09-26 DIAGNOSIS — E11621 Type 2 diabetes mellitus with foot ulcer: Secondary | ICD-10-CM | POA: Diagnosis not present

## 2021-09-26 DIAGNOSIS — Z992 Dependence on renal dialysis: Secondary | ICD-10-CM | POA: Diagnosis not present

## 2021-09-26 DIAGNOSIS — I10 Essential (primary) hypertension: Secondary | ICD-10-CM | POA: Diagnosis not present

## 2021-09-26 DIAGNOSIS — Z7401 Bed confinement status: Secondary | ICD-10-CM | POA: Diagnosis not present

## 2021-09-26 DIAGNOSIS — N2581 Secondary hyperparathyroidism of renal origin: Secondary | ICD-10-CM | POA: Diagnosis not present

## 2021-09-26 DIAGNOSIS — Z743 Need for continuous supervision: Secondary | ICD-10-CM | POA: Diagnosis not present

## 2021-09-27 DIAGNOSIS — N186 End stage renal disease: Secondary | ICD-10-CM | POA: Diagnosis not present

## 2021-09-27 DIAGNOSIS — I10 Essential (primary) hypertension: Secondary | ICD-10-CM | POA: Diagnosis not present

## 2021-09-27 DIAGNOSIS — Z992 Dependence on renal dialysis: Secondary | ICD-10-CM | POA: Diagnosis not present

## 2021-09-27 DIAGNOSIS — I129 Hypertensive chronic kidney disease with stage 1 through stage 4 chronic kidney disease, or unspecified chronic kidney disease: Secondary | ICD-10-CM | POA: Diagnosis not present

## 2021-09-28 DIAGNOSIS — I10 Essential (primary) hypertension: Secondary | ICD-10-CM | POA: Diagnosis not present

## 2021-09-29 DIAGNOSIS — R739 Hyperglycemia, unspecified: Secondary | ICD-10-CM | POA: Diagnosis not present

## 2021-09-29 DIAGNOSIS — G8929 Other chronic pain: Secondary | ICD-10-CM | POA: Diagnosis not present

## 2021-09-29 DIAGNOSIS — I10 Essential (primary) hypertension: Secondary | ICD-10-CM | POA: Diagnosis not present

## 2021-09-29 DIAGNOSIS — D689 Coagulation defect, unspecified: Secondary | ICD-10-CM | POA: Diagnosis not present

## 2021-09-29 DIAGNOSIS — Z7401 Bed confinement status: Secondary | ICD-10-CM | POA: Diagnosis not present

## 2021-09-29 DIAGNOSIS — E875 Hyperkalemia: Secondary | ICD-10-CM | POA: Diagnosis not present

## 2021-09-29 DIAGNOSIS — Z992 Dependence on renal dialysis: Secondary | ICD-10-CM | POA: Diagnosis not present

## 2021-09-29 DIAGNOSIS — Z743 Need for continuous supervision: Secondary | ICD-10-CM | POA: Diagnosis not present

## 2021-09-29 DIAGNOSIS — E8779 Other fluid overload: Secondary | ICD-10-CM | POA: Diagnosis not present

## 2021-09-29 DIAGNOSIS — T8249XA Other complication of vascular dialysis catheter, initial encounter: Secondary | ICD-10-CM | POA: Diagnosis not present

## 2021-09-29 DIAGNOSIS — R519 Headache, unspecified: Secondary | ICD-10-CM | POA: Diagnosis not present

## 2021-09-29 DIAGNOSIS — N186 End stage renal disease: Secondary | ICD-10-CM | POA: Diagnosis not present

## 2021-09-29 DIAGNOSIS — N2581 Secondary hyperparathyroidism of renal origin: Secondary | ICD-10-CM | POA: Diagnosis not present

## 2021-09-30 DIAGNOSIS — I10 Essential (primary) hypertension: Secondary | ICD-10-CM | POA: Diagnosis not present

## 2021-10-01 DIAGNOSIS — Z743 Need for continuous supervision: Secondary | ICD-10-CM | POA: Diagnosis not present

## 2021-10-01 DIAGNOSIS — E875 Hyperkalemia: Secondary | ICD-10-CM | POA: Diagnosis not present

## 2021-10-01 DIAGNOSIS — I10 Essential (primary) hypertension: Secondary | ICD-10-CM | POA: Diagnosis not present

## 2021-10-01 DIAGNOSIS — R531 Weakness: Secondary | ICD-10-CM | POA: Diagnosis not present

## 2021-10-01 DIAGNOSIS — N186 End stage renal disease: Secondary | ICD-10-CM | POA: Diagnosis not present

## 2021-10-01 DIAGNOSIS — Z992 Dependence on renal dialysis: Secondary | ICD-10-CM | POA: Diagnosis not present

## 2021-10-01 DIAGNOSIS — T8249XA Other complication of vascular dialysis catheter, initial encounter: Secondary | ICD-10-CM | POA: Diagnosis not present

## 2021-10-01 DIAGNOSIS — E8779 Other fluid overload: Secondary | ICD-10-CM | POA: Diagnosis not present

## 2021-10-01 DIAGNOSIS — N2581 Secondary hyperparathyroidism of renal origin: Secondary | ICD-10-CM | POA: Diagnosis not present

## 2021-10-01 DIAGNOSIS — D689 Coagulation defect, unspecified: Secondary | ICD-10-CM | POA: Diagnosis not present

## 2021-10-01 DIAGNOSIS — R519 Headache, unspecified: Secondary | ICD-10-CM | POA: Diagnosis not present

## 2021-10-02 DIAGNOSIS — I10 Essential (primary) hypertension: Secondary | ICD-10-CM | POA: Diagnosis not present

## 2021-10-03 DIAGNOSIS — R531 Weakness: Secondary | ICD-10-CM | POA: Diagnosis not present

## 2021-10-03 DIAGNOSIS — I10 Essential (primary) hypertension: Secondary | ICD-10-CM | POA: Diagnosis not present

## 2021-10-03 DIAGNOSIS — R519 Headache, unspecified: Secondary | ICD-10-CM | POA: Diagnosis not present

## 2021-10-03 DIAGNOSIS — T8249XA Other complication of vascular dialysis catheter, initial encounter: Secondary | ICD-10-CM | POA: Diagnosis not present

## 2021-10-03 DIAGNOSIS — Z992 Dependence on renal dialysis: Secondary | ICD-10-CM | POA: Diagnosis not present

## 2021-10-03 DIAGNOSIS — E875 Hyperkalemia: Secondary | ICD-10-CM | POA: Diagnosis not present

## 2021-10-03 DIAGNOSIS — Z7401 Bed confinement status: Secondary | ICD-10-CM | POA: Diagnosis not present

## 2021-10-03 DIAGNOSIS — N2581 Secondary hyperparathyroidism of renal origin: Secondary | ICD-10-CM | POA: Diagnosis not present

## 2021-10-03 DIAGNOSIS — D689 Coagulation defect, unspecified: Secondary | ICD-10-CM | POA: Diagnosis not present

## 2021-10-03 DIAGNOSIS — N186 End stage renal disease: Secondary | ICD-10-CM | POA: Diagnosis not present

## 2021-10-03 DIAGNOSIS — E8779 Other fluid overload: Secondary | ICD-10-CM | POA: Diagnosis not present

## 2021-10-04 DIAGNOSIS — R531 Weakness: Secondary | ICD-10-CM | POA: Diagnosis not present

## 2021-10-04 DIAGNOSIS — N2581 Secondary hyperparathyroidism of renal origin: Secondary | ICD-10-CM | POA: Diagnosis not present

## 2021-10-04 DIAGNOSIS — R519 Headache, unspecified: Secondary | ICD-10-CM | POA: Diagnosis not present

## 2021-10-04 DIAGNOSIS — Z7401 Bed confinement status: Secondary | ICD-10-CM | POA: Diagnosis not present

## 2021-10-04 DIAGNOSIS — E875 Hyperkalemia: Secondary | ICD-10-CM | POA: Diagnosis not present

## 2021-10-04 DIAGNOSIS — D689 Coagulation defect, unspecified: Secondary | ICD-10-CM | POA: Diagnosis not present

## 2021-10-04 DIAGNOSIS — T8249XA Other complication of vascular dialysis catheter, initial encounter: Secondary | ICD-10-CM | POA: Diagnosis not present

## 2021-10-04 DIAGNOSIS — E8779 Other fluid overload: Secondary | ICD-10-CM | POA: Diagnosis not present

## 2021-10-04 DIAGNOSIS — Z992 Dependence on renal dialysis: Secondary | ICD-10-CM | POA: Diagnosis not present

## 2021-10-04 DIAGNOSIS — N186 End stage renal disease: Secondary | ICD-10-CM | POA: Diagnosis not present

## 2021-10-04 DIAGNOSIS — I10 Essential (primary) hypertension: Secondary | ICD-10-CM | POA: Diagnosis not present

## 2021-10-05 DIAGNOSIS — I10 Essential (primary) hypertension: Secondary | ICD-10-CM | POA: Diagnosis not present

## 2021-10-06 DIAGNOSIS — Z7401 Bed confinement status: Secondary | ICD-10-CM | POA: Diagnosis not present

## 2021-10-06 DIAGNOSIS — T8249XA Other complication of vascular dialysis catheter, initial encounter: Secondary | ICD-10-CM | POA: Diagnosis not present

## 2021-10-06 DIAGNOSIS — E11621 Type 2 diabetes mellitus with foot ulcer: Secondary | ICD-10-CM | POA: Diagnosis not present

## 2021-10-06 DIAGNOSIS — Z992 Dependence on renal dialysis: Secondary | ICD-10-CM | POA: Diagnosis not present

## 2021-10-06 DIAGNOSIS — R531 Weakness: Secondary | ICD-10-CM | POA: Diagnosis not present

## 2021-10-06 DIAGNOSIS — D689 Coagulation defect, unspecified: Secondary | ICD-10-CM | POA: Diagnosis not present

## 2021-10-06 DIAGNOSIS — I10 Essential (primary) hypertension: Secondary | ICD-10-CM | POA: Diagnosis not present

## 2021-10-06 DIAGNOSIS — N186 End stage renal disease: Secondary | ICD-10-CM | POA: Diagnosis not present

## 2021-10-06 DIAGNOSIS — N2581 Secondary hyperparathyroidism of renal origin: Secondary | ICD-10-CM | POA: Diagnosis not present

## 2021-10-06 DIAGNOSIS — D631 Anemia in chronic kidney disease: Secondary | ICD-10-CM | POA: Diagnosis not present

## 2021-10-07 DIAGNOSIS — I10 Essential (primary) hypertension: Secondary | ICD-10-CM | POA: Diagnosis not present

## 2021-10-08 DIAGNOSIS — I10 Essential (primary) hypertension: Secondary | ICD-10-CM | POA: Diagnosis not present

## 2021-10-08 DIAGNOSIS — D689 Coagulation defect, unspecified: Secondary | ICD-10-CM | POA: Diagnosis not present

## 2021-10-08 DIAGNOSIS — R531 Weakness: Secondary | ICD-10-CM | POA: Diagnosis not present

## 2021-10-08 DIAGNOSIS — D631 Anemia in chronic kidney disease: Secondary | ICD-10-CM | POA: Diagnosis not present

## 2021-10-08 DIAGNOSIS — Z992 Dependence on renal dialysis: Secondary | ICD-10-CM | POA: Diagnosis not present

## 2021-10-08 DIAGNOSIS — Z7401 Bed confinement status: Secondary | ICD-10-CM | POA: Diagnosis not present

## 2021-10-08 DIAGNOSIS — Z743 Need for continuous supervision: Secondary | ICD-10-CM | POA: Diagnosis not present

## 2021-10-08 DIAGNOSIS — N186 End stage renal disease: Secondary | ICD-10-CM | POA: Diagnosis not present

## 2021-10-08 DIAGNOSIS — N2581 Secondary hyperparathyroidism of renal origin: Secondary | ICD-10-CM | POA: Diagnosis not present

## 2021-10-08 DIAGNOSIS — T8249XA Other complication of vascular dialysis catheter, initial encounter: Secondary | ICD-10-CM | POA: Diagnosis not present

## 2021-10-08 DIAGNOSIS — E11621 Type 2 diabetes mellitus with foot ulcer: Secondary | ICD-10-CM | POA: Diagnosis not present

## 2021-10-09 DIAGNOSIS — I10 Essential (primary) hypertension: Secondary | ICD-10-CM | POA: Diagnosis not present

## 2021-10-10 DIAGNOSIS — N186 End stage renal disease: Secondary | ICD-10-CM | POA: Diagnosis not present

## 2021-10-10 DIAGNOSIS — E11621 Type 2 diabetes mellitus with foot ulcer: Secondary | ICD-10-CM | POA: Diagnosis not present

## 2021-10-10 DIAGNOSIS — N2581 Secondary hyperparathyroidism of renal origin: Secondary | ICD-10-CM | POA: Diagnosis not present

## 2021-10-10 DIAGNOSIS — T8249XA Other complication of vascular dialysis catheter, initial encounter: Secondary | ICD-10-CM | POA: Diagnosis not present

## 2021-10-10 DIAGNOSIS — D631 Anemia in chronic kidney disease: Secondary | ICD-10-CM | POA: Diagnosis not present

## 2021-10-10 DIAGNOSIS — Z992 Dependence on renal dialysis: Secondary | ICD-10-CM | POA: Diagnosis not present

## 2021-10-10 DIAGNOSIS — R531 Weakness: Secondary | ICD-10-CM | POA: Diagnosis not present

## 2021-10-10 DIAGNOSIS — D689 Coagulation defect, unspecified: Secondary | ICD-10-CM | POA: Diagnosis not present

## 2021-10-10 DIAGNOSIS — I10 Essential (primary) hypertension: Secondary | ICD-10-CM | POA: Diagnosis not present

## 2021-10-10 DIAGNOSIS — Z7401 Bed confinement status: Secondary | ICD-10-CM | POA: Diagnosis not present

## 2021-10-11 DIAGNOSIS — I10 Essential (primary) hypertension: Secondary | ICD-10-CM | POA: Diagnosis not present

## 2021-10-12 DIAGNOSIS — I10 Essential (primary) hypertension: Secondary | ICD-10-CM | POA: Diagnosis not present

## 2021-10-13 DIAGNOSIS — I10 Essential (primary) hypertension: Secondary | ICD-10-CM | POA: Diagnosis not present

## 2021-10-13 DIAGNOSIS — R531 Weakness: Secondary | ICD-10-CM | POA: Diagnosis not present

## 2021-10-13 DIAGNOSIS — T8249XA Other complication of vascular dialysis catheter, initial encounter: Secondary | ICD-10-CM | POA: Diagnosis not present

## 2021-10-13 DIAGNOSIS — Z992 Dependence on renal dialysis: Secondary | ICD-10-CM | POA: Diagnosis not present

## 2021-10-13 DIAGNOSIS — R519 Headache, unspecified: Secondary | ICD-10-CM | POA: Diagnosis not present

## 2021-10-13 DIAGNOSIS — N2581 Secondary hyperparathyroidism of renal origin: Secondary | ICD-10-CM | POA: Diagnosis not present

## 2021-10-13 DIAGNOSIS — D689 Coagulation defect, unspecified: Secondary | ICD-10-CM | POA: Diagnosis not present

## 2021-10-13 DIAGNOSIS — Z7401 Bed confinement status: Secondary | ICD-10-CM | POA: Diagnosis not present

## 2021-10-13 DIAGNOSIS — N186 End stage renal disease: Secondary | ICD-10-CM | POA: Diagnosis not present

## 2021-10-14 DIAGNOSIS — I10 Essential (primary) hypertension: Secondary | ICD-10-CM | POA: Diagnosis not present

## 2021-10-15 DIAGNOSIS — N2581 Secondary hyperparathyroidism of renal origin: Secondary | ICD-10-CM | POA: Diagnosis not present

## 2021-10-15 DIAGNOSIS — Z743 Need for continuous supervision: Secondary | ICD-10-CM | POA: Diagnosis not present

## 2021-10-15 DIAGNOSIS — R519 Headache, unspecified: Secondary | ICD-10-CM | POA: Diagnosis not present

## 2021-10-15 DIAGNOSIS — N186 End stage renal disease: Secondary | ICD-10-CM | POA: Diagnosis not present

## 2021-10-15 DIAGNOSIS — I10 Essential (primary) hypertension: Secondary | ICD-10-CM | POA: Diagnosis not present

## 2021-10-15 DIAGNOSIS — D689 Coagulation defect, unspecified: Secondary | ICD-10-CM | POA: Diagnosis not present

## 2021-10-15 DIAGNOSIS — R531 Weakness: Secondary | ICD-10-CM | POA: Diagnosis not present

## 2021-10-15 DIAGNOSIS — Z7401 Bed confinement status: Secondary | ICD-10-CM | POA: Diagnosis not present

## 2021-10-15 DIAGNOSIS — Z992 Dependence on renal dialysis: Secondary | ICD-10-CM | POA: Diagnosis not present

## 2021-10-15 DIAGNOSIS — T8249XA Other complication of vascular dialysis catheter, initial encounter: Secondary | ICD-10-CM | POA: Diagnosis not present

## 2021-10-16 DIAGNOSIS — I10 Essential (primary) hypertension: Secondary | ICD-10-CM | POA: Diagnosis not present

## 2021-10-17 DIAGNOSIS — I10 Essential (primary) hypertension: Secondary | ICD-10-CM | POA: Diagnosis not present

## 2021-10-17 DIAGNOSIS — I959 Hypotension, unspecified: Secondary | ICD-10-CM | POA: Diagnosis not present

## 2021-10-17 DIAGNOSIS — D689 Coagulation defect, unspecified: Secondary | ICD-10-CM | POA: Diagnosis not present

## 2021-10-17 DIAGNOSIS — T8249XA Other complication of vascular dialysis catheter, initial encounter: Secondary | ICD-10-CM | POA: Diagnosis not present

## 2021-10-17 DIAGNOSIS — R69 Illness, unspecified: Secondary | ICD-10-CM | POA: Diagnosis not present

## 2021-10-17 DIAGNOSIS — N186 End stage renal disease: Secondary | ICD-10-CM | POA: Diagnosis not present

## 2021-10-17 DIAGNOSIS — R456 Violent behavior: Secondary | ICD-10-CM | POA: Diagnosis not present

## 2021-10-17 DIAGNOSIS — Z7401 Bed confinement status: Secondary | ICD-10-CM | POA: Diagnosis not present

## 2021-10-17 DIAGNOSIS — R531 Weakness: Secondary | ICD-10-CM | POA: Diagnosis not present

## 2021-10-17 DIAGNOSIS — Z992 Dependence on renal dialysis: Secondary | ICD-10-CM | POA: Diagnosis not present

## 2021-10-17 DIAGNOSIS — R519 Headache, unspecified: Secondary | ICD-10-CM | POA: Diagnosis not present

## 2021-10-17 DIAGNOSIS — N2581 Secondary hyperparathyroidism of renal origin: Secondary | ICD-10-CM | POA: Diagnosis not present

## 2021-10-18 DIAGNOSIS — I10 Essential (primary) hypertension: Secondary | ICD-10-CM | POA: Diagnosis not present

## 2021-10-19 DIAGNOSIS — I10 Essential (primary) hypertension: Secondary | ICD-10-CM | POA: Diagnosis not present

## 2021-10-20 DIAGNOSIS — Z7401 Bed confinement status: Secondary | ICD-10-CM | POA: Diagnosis not present

## 2021-10-20 DIAGNOSIS — I10 Essential (primary) hypertension: Secondary | ICD-10-CM | POA: Diagnosis not present

## 2021-10-20 DIAGNOSIS — R531 Weakness: Secondary | ICD-10-CM | POA: Diagnosis not present

## 2021-10-20 DIAGNOSIS — T8249XA Other complication of vascular dialysis catheter, initial encounter: Secondary | ICD-10-CM | POA: Diagnosis not present

## 2021-10-20 DIAGNOSIS — D689 Coagulation defect, unspecified: Secondary | ICD-10-CM | POA: Diagnosis not present

## 2021-10-20 DIAGNOSIS — Z992 Dependence on renal dialysis: Secondary | ICD-10-CM | POA: Diagnosis not present

## 2021-10-20 DIAGNOSIS — N2581 Secondary hyperparathyroidism of renal origin: Secondary | ICD-10-CM | POA: Diagnosis not present

## 2021-10-20 DIAGNOSIS — E11621 Type 2 diabetes mellitus with foot ulcer: Secondary | ICD-10-CM | POA: Diagnosis not present

## 2021-10-20 DIAGNOSIS — R519 Headache, unspecified: Secondary | ICD-10-CM | POA: Diagnosis not present

## 2021-10-20 DIAGNOSIS — N186 End stage renal disease: Secondary | ICD-10-CM | POA: Diagnosis not present

## 2021-10-21 DIAGNOSIS — I10 Essential (primary) hypertension: Secondary | ICD-10-CM | POA: Diagnosis not present

## 2021-10-22 DIAGNOSIS — Z992 Dependence on renal dialysis: Secondary | ICD-10-CM | POA: Diagnosis not present

## 2021-10-22 DIAGNOSIS — E11621 Type 2 diabetes mellitus with foot ulcer: Secondary | ICD-10-CM | POA: Diagnosis not present

## 2021-10-22 DIAGNOSIS — I10 Essential (primary) hypertension: Secondary | ICD-10-CM | POA: Diagnosis not present

## 2021-10-22 DIAGNOSIS — D689 Coagulation defect, unspecified: Secondary | ICD-10-CM | POA: Diagnosis not present

## 2021-10-22 DIAGNOSIS — R519 Headache, unspecified: Secondary | ICD-10-CM | POA: Diagnosis not present

## 2021-10-22 DIAGNOSIS — T8249XA Other complication of vascular dialysis catheter, initial encounter: Secondary | ICD-10-CM | POA: Diagnosis not present

## 2021-10-22 DIAGNOSIS — N2581 Secondary hyperparathyroidism of renal origin: Secondary | ICD-10-CM | POA: Diagnosis not present

## 2021-10-22 DIAGNOSIS — R531 Weakness: Secondary | ICD-10-CM | POA: Diagnosis not present

## 2021-10-22 DIAGNOSIS — Z7401 Bed confinement status: Secondary | ICD-10-CM | POA: Diagnosis not present

## 2021-10-22 DIAGNOSIS — N186 End stage renal disease: Secondary | ICD-10-CM | POA: Diagnosis not present

## 2021-10-23 DIAGNOSIS — I10 Essential (primary) hypertension: Secondary | ICD-10-CM | POA: Diagnosis not present

## 2021-10-24 DIAGNOSIS — S99929A Unspecified injury of unspecified foot, initial encounter: Secondary | ICD-10-CM | POA: Diagnosis not present

## 2021-10-24 DIAGNOSIS — E11621 Type 2 diabetes mellitus with foot ulcer: Secondary | ICD-10-CM | POA: Diagnosis not present

## 2021-10-24 DIAGNOSIS — I10 Essential (primary) hypertension: Secondary | ICD-10-CM | POA: Diagnosis not present

## 2021-10-24 DIAGNOSIS — Z992 Dependence on renal dialysis: Secondary | ICD-10-CM | POA: Diagnosis not present

## 2021-10-24 DIAGNOSIS — R531 Weakness: Secondary | ICD-10-CM | POA: Diagnosis not present

## 2021-10-24 DIAGNOSIS — N2581 Secondary hyperparathyroidism of renal origin: Secondary | ICD-10-CM | POA: Diagnosis not present

## 2021-10-24 DIAGNOSIS — R519 Headache, unspecified: Secondary | ICD-10-CM | POA: Diagnosis not present

## 2021-10-24 DIAGNOSIS — D689 Coagulation defect, unspecified: Secondary | ICD-10-CM | POA: Diagnosis not present

## 2021-10-24 DIAGNOSIS — Z7401 Bed confinement status: Secondary | ICD-10-CM | POA: Diagnosis not present

## 2021-10-24 DIAGNOSIS — Z743 Need for continuous supervision: Secondary | ICD-10-CM | POA: Diagnosis not present

## 2021-10-24 DIAGNOSIS — T8249XA Other complication of vascular dialysis catheter, initial encounter: Secondary | ICD-10-CM | POA: Diagnosis not present

## 2021-10-24 DIAGNOSIS — N186 End stage renal disease: Secondary | ICD-10-CM | POA: Diagnosis not present

## 2021-10-25 DIAGNOSIS — I10 Essential (primary) hypertension: Secondary | ICD-10-CM | POA: Diagnosis not present

## 2021-10-26 DIAGNOSIS — I10 Essential (primary) hypertension: Secondary | ICD-10-CM | POA: Diagnosis not present

## 2021-10-27 DIAGNOSIS — I10 Essential (primary) hypertension: Secondary | ICD-10-CM | POA: Diagnosis not present

## 2021-10-27 DIAGNOSIS — G8929 Other chronic pain: Secondary | ICD-10-CM | POA: Diagnosis not present

## 2021-10-27 DIAGNOSIS — N186 End stage renal disease: Secondary | ICD-10-CM | POA: Diagnosis not present

## 2021-10-27 DIAGNOSIS — E8770 Fluid overload, unspecified: Secondary | ICD-10-CM | POA: Diagnosis not present

## 2021-10-27 DIAGNOSIS — N2581 Secondary hyperparathyroidism of renal origin: Secondary | ICD-10-CM | POA: Diagnosis not present

## 2021-10-27 DIAGNOSIS — Z7401 Bed confinement status: Secondary | ICD-10-CM | POA: Diagnosis not present

## 2021-10-27 DIAGNOSIS — T8249XA Other complication of vascular dialysis catheter, initial encounter: Secondary | ICD-10-CM | POA: Diagnosis not present

## 2021-10-27 DIAGNOSIS — Z992 Dependence on renal dialysis: Secondary | ICD-10-CM | POA: Diagnosis not present

## 2021-10-27 DIAGNOSIS — Z743 Need for continuous supervision: Secondary | ICD-10-CM | POA: Diagnosis not present

## 2021-10-27 DIAGNOSIS — D689 Coagulation defect, unspecified: Secondary | ICD-10-CM | POA: Diagnosis not present

## 2021-10-28 DIAGNOSIS — I129 Hypertensive chronic kidney disease with stage 1 through stage 4 chronic kidney disease, or unspecified chronic kidney disease: Secondary | ICD-10-CM | POA: Diagnosis not present

## 2021-10-28 DIAGNOSIS — Z7401 Bed confinement status: Secondary | ICD-10-CM | POA: Diagnosis not present

## 2021-10-28 DIAGNOSIS — T8249XA Other complication of vascular dialysis catheter, initial encounter: Secondary | ICD-10-CM | POA: Diagnosis not present

## 2021-10-28 DIAGNOSIS — D689 Coagulation defect, unspecified: Secondary | ICD-10-CM | POA: Diagnosis not present

## 2021-10-28 DIAGNOSIS — Z992 Dependence on renal dialysis: Secondary | ICD-10-CM | POA: Diagnosis not present

## 2021-10-28 DIAGNOSIS — E8770 Fluid overload, unspecified: Secondary | ICD-10-CM | POA: Diagnosis not present

## 2021-10-28 DIAGNOSIS — I10 Essential (primary) hypertension: Secondary | ICD-10-CM | POA: Diagnosis not present

## 2021-10-28 DIAGNOSIS — R531 Weakness: Secondary | ICD-10-CM | POA: Diagnosis not present

## 2021-10-28 DIAGNOSIS — N2581 Secondary hyperparathyroidism of renal origin: Secondary | ICD-10-CM | POA: Diagnosis not present

## 2021-10-28 DIAGNOSIS — N186 End stage renal disease: Secondary | ICD-10-CM | POA: Diagnosis not present

## 2021-10-29 DIAGNOSIS — N186 End stage renal disease: Secondary | ICD-10-CM | POA: Diagnosis not present

## 2021-10-29 DIAGNOSIS — T8249XA Other complication of vascular dialysis catheter, initial encounter: Secondary | ICD-10-CM | POA: Diagnosis not present

## 2021-10-29 DIAGNOSIS — Z992 Dependence on renal dialysis: Secondary | ICD-10-CM | POA: Diagnosis not present

## 2021-10-29 DIAGNOSIS — D689 Coagulation defect, unspecified: Secondary | ICD-10-CM | POA: Diagnosis not present

## 2021-10-29 DIAGNOSIS — Z7401 Bed confinement status: Secondary | ICD-10-CM | POA: Diagnosis not present

## 2021-10-29 DIAGNOSIS — E11621 Type 2 diabetes mellitus with foot ulcer: Secondary | ICD-10-CM | POA: Diagnosis not present

## 2021-10-29 DIAGNOSIS — R739 Hyperglycemia, unspecified: Secondary | ICD-10-CM | POA: Diagnosis not present

## 2021-10-29 DIAGNOSIS — R531 Weakness: Secondary | ICD-10-CM | POA: Diagnosis not present

## 2021-10-29 DIAGNOSIS — I10 Essential (primary) hypertension: Secondary | ICD-10-CM | POA: Diagnosis not present

## 2021-10-29 DIAGNOSIS — N2581 Secondary hyperparathyroidism of renal origin: Secondary | ICD-10-CM | POA: Diagnosis not present

## 2021-10-30 ENCOUNTER — Ambulatory Visit: Payer: Medicare HMO | Attending: Primary Care | Admitting: Rehabilitation

## 2021-10-30 DIAGNOSIS — M6281 Muscle weakness (generalized): Secondary | ICD-10-CM | POA: Insufficient documentation

## 2021-10-30 DIAGNOSIS — R296 Repeated falls: Secondary | ICD-10-CM | POA: Insufficient documentation

## 2021-10-30 DIAGNOSIS — R2689 Other abnormalities of gait and mobility: Secondary | ICD-10-CM | POA: Insufficient documentation

## 2021-10-30 DIAGNOSIS — R2681 Unsteadiness on feet: Secondary | ICD-10-CM | POA: Insufficient documentation

## 2021-10-30 DIAGNOSIS — M25652 Stiffness of left hip, not elsewhere classified: Secondary | ICD-10-CM | POA: Insufficient documentation

## 2021-10-30 DIAGNOSIS — I10 Essential (primary) hypertension: Secondary | ICD-10-CM | POA: Diagnosis not present

## 2021-10-31 DIAGNOSIS — E1122 Type 2 diabetes mellitus with diabetic chronic kidney disease: Secondary | ICD-10-CM | POA: Diagnosis not present

## 2021-10-31 DIAGNOSIS — Z89421 Acquired absence of other right toe(s): Secondary | ICD-10-CM | POA: Diagnosis not present

## 2021-10-31 DIAGNOSIS — G8929 Other chronic pain: Secondary | ICD-10-CM | POA: Diagnosis not present

## 2021-10-31 DIAGNOSIS — Z794 Long term (current) use of insulin: Secondary | ICD-10-CM | POA: Diagnosis not present

## 2021-10-31 DIAGNOSIS — I10 Essential (primary) hypertension: Secondary | ICD-10-CM | POA: Diagnosis not present

## 2021-10-31 DIAGNOSIS — D689 Coagulation defect, unspecified: Secondary | ICD-10-CM | POA: Diagnosis not present

## 2021-10-31 DIAGNOSIS — E1165 Type 2 diabetes mellitus with hyperglycemia: Secondary | ICD-10-CM | POA: Diagnosis not present

## 2021-10-31 DIAGNOSIS — T8249XA Other complication of vascular dialysis catheter, initial encounter: Secondary | ICD-10-CM | POA: Diagnosis not present

## 2021-10-31 DIAGNOSIS — N186 End stage renal disease: Secondary | ICD-10-CM | POA: Diagnosis not present

## 2021-10-31 DIAGNOSIS — E1151 Type 2 diabetes mellitus with diabetic peripheral angiopathy without gangrene: Secondary | ICD-10-CM | POA: Diagnosis not present

## 2021-10-31 DIAGNOSIS — Z89612 Acquired absence of left leg above knee: Secondary | ICD-10-CM | POA: Diagnosis not present

## 2021-10-31 DIAGNOSIS — Z7401 Bed confinement status: Secondary | ICD-10-CM | POA: Diagnosis not present

## 2021-10-31 DIAGNOSIS — Z992 Dependence on renal dialysis: Secondary | ICD-10-CM | POA: Diagnosis not present

## 2021-10-31 DIAGNOSIS — E11621 Type 2 diabetes mellitus with foot ulcer: Secondary | ICD-10-CM | POA: Diagnosis not present

## 2021-10-31 DIAGNOSIS — R531 Weakness: Secondary | ICD-10-CM | POA: Diagnosis not present

## 2021-10-31 DIAGNOSIS — N2581 Secondary hyperparathyroidism of renal origin: Secondary | ICD-10-CM | POA: Diagnosis not present

## 2021-10-31 DIAGNOSIS — Z008 Encounter for other general examination: Secondary | ICD-10-CM | POA: Diagnosis not present

## 2021-10-31 DIAGNOSIS — E1142 Type 2 diabetes mellitus with diabetic polyneuropathy: Secondary | ICD-10-CM | POA: Diagnosis not present

## 2021-10-31 DIAGNOSIS — G546 Phantom limb syndrome with pain: Secondary | ICD-10-CM | POA: Diagnosis not present

## 2021-10-31 DIAGNOSIS — Z791 Long term (current) use of non-steroidal anti-inflammatories (NSAID): Secondary | ICD-10-CM | POA: Diagnosis not present

## 2021-10-31 LAB — HM DIABETES EYE EXAM

## 2021-11-01 DIAGNOSIS — I10 Essential (primary) hypertension: Secondary | ICD-10-CM | POA: Diagnosis not present

## 2021-11-02 DIAGNOSIS — I10 Essential (primary) hypertension: Secondary | ICD-10-CM | POA: Diagnosis not present

## 2021-11-03 DIAGNOSIS — Z743 Need for continuous supervision: Secondary | ICD-10-CM | POA: Diagnosis not present

## 2021-11-03 DIAGNOSIS — N186 End stage renal disease: Secondary | ICD-10-CM | POA: Diagnosis not present

## 2021-11-03 DIAGNOSIS — Z992 Dependence on renal dialysis: Secondary | ICD-10-CM | POA: Diagnosis not present

## 2021-11-03 DIAGNOSIS — N2581 Secondary hyperparathyroidism of renal origin: Secondary | ICD-10-CM | POA: Diagnosis not present

## 2021-11-03 DIAGNOSIS — R531 Weakness: Secondary | ICD-10-CM | POA: Diagnosis not present

## 2021-11-03 DIAGNOSIS — Z7401 Bed confinement status: Secondary | ICD-10-CM | POA: Diagnosis not present

## 2021-11-03 DIAGNOSIS — I10 Essential (primary) hypertension: Secondary | ICD-10-CM | POA: Diagnosis not present

## 2021-11-03 DIAGNOSIS — D631 Anemia in chronic kidney disease: Secondary | ICD-10-CM | POA: Diagnosis not present

## 2021-11-03 DIAGNOSIS — R519 Headache, unspecified: Secondary | ICD-10-CM | POA: Diagnosis not present

## 2021-11-03 DIAGNOSIS — T8249XA Other complication of vascular dialysis catheter, initial encounter: Secondary | ICD-10-CM | POA: Diagnosis not present

## 2021-11-03 DIAGNOSIS — D689 Coagulation defect, unspecified: Secondary | ICD-10-CM | POA: Diagnosis not present

## 2021-11-04 DIAGNOSIS — I10 Essential (primary) hypertension: Secondary | ICD-10-CM | POA: Diagnosis not present

## 2021-11-05 DIAGNOSIS — R519 Headache, unspecified: Secondary | ICD-10-CM | POA: Diagnosis not present

## 2021-11-05 DIAGNOSIS — N2581 Secondary hyperparathyroidism of renal origin: Secondary | ICD-10-CM | POA: Diagnosis not present

## 2021-11-05 DIAGNOSIS — R531 Weakness: Secondary | ICD-10-CM | POA: Diagnosis not present

## 2021-11-05 DIAGNOSIS — I10 Essential (primary) hypertension: Secondary | ICD-10-CM | POA: Diagnosis not present

## 2021-11-05 DIAGNOSIS — Z743 Need for continuous supervision: Secondary | ICD-10-CM | POA: Diagnosis not present

## 2021-11-05 DIAGNOSIS — D689 Coagulation defect, unspecified: Secondary | ICD-10-CM | POA: Diagnosis not present

## 2021-11-05 DIAGNOSIS — D631 Anemia in chronic kidney disease: Secondary | ICD-10-CM | POA: Diagnosis not present

## 2021-11-05 DIAGNOSIS — T8249XA Other complication of vascular dialysis catheter, initial encounter: Secondary | ICD-10-CM | POA: Diagnosis not present

## 2021-11-05 DIAGNOSIS — N186 End stage renal disease: Secondary | ICD-10-CM | POA: Diagnosis not present

## 2021-11-05 DIAGNOSIS — Z992 Dependence on renal dialysis: Secondary | ICD-10-CM | POA: Diagnosis not present

## 2021-11-05 DIAGNOSIS — Z7401 Bed confinement status: Secondary | ICD-10-CM | POA: Diagnosis not present

## 2021-11-06 DIAGNOSIS — I10 Essential (primary) hypertension: Secondary | ICD-10-CM | POA: Diagnosis not present

## 2021-11-07 DIAGNOSIS — D689 Coagulation defect, unspecified: Secondary | ICD-10-CM | POA: Diagnosis not present

## 2021-11-07 DIAGNOSIS — R519 Headache, unspecified: Secondary | ICD-10-CM | POA: Diagnosis not present

## 2021-11-07 DIAGNOSIS — R531 Weakness: Secondary | ICD-10-CM | POA: Diagnosis not present

## 2021-11-07 DIAGNOSIS — T8249XA Other complication of vascular dialysis catheter, initial encounter: Secondary | ICD-10-CM | POA: Diagnosis not present

## 2021-11-07 DIAGNOSIS — I10 Essential (primary) hypertension: Secondary | ICD-10-CM | POA: Diagnosis not present

## 2021-11-07 DIAGNOSIS — Z992 Dependence on renal dialysis: Secondary | ICD-10-CM | POA: Diagnosis not present

## 2021-11-07 DIAGNOSIS — Z7401 Bed confinement status: Secondary | ICD-10-CM | POA: Diagnosis not present

## 2021-11-07 DIAGNOSIS — D631 Anemia in chronic kidney disease: Secondary | ICD-10-CM | POA: Diagnosis not present

## 2021-11-07 DIAGNOSIS — N186 End stage renal disease: Secondary | ICD-10-CM | POA: Diagnosis not present

## 2021-11-07 DIAGNOSIS — G8929 Other chronic pain: Secondary | ICD-10-CM | POA: Diagnosis not present

## 2021-11-07 DIAGNOSIS — Z743 Need for continuous supervision: Secondary | ICD-10-CM | POA: Diagnosis not present

## 2021-11-07 DIAGNOSIS — N2581 Secondary hyperparathyroidism of renal origin: Secondary | ICD-10-CM | POA: Diagnosis not present

## 2021-11-08 DIAGNOSIS — I10 Essential (primary) hypertension: Secondary | ICD-10-CM | POA: Diagnosis not present

## 2021-11-09 DIAGNOSIS — I10 Essential (primary) hypertension: Secondary | ICD-10-CM | POA: Diagnosis not present

## 2021-11-10 DIAGNOSIS — R519 Headache, unspecified: Secondary | ICD-10-CM | POA: Diagnosis not present

## 2021-11-10 DIAGNOSIS — I10 Essential (primary) hypertension: Secondary | ICD-10-CM | POA: Diagnosis not present

## 2021-11-10 DIAGNOSIS — R531 Weakness: Secondary | ICD-10-CM | POA: Diagnosis not present

## 2021-11-10 DIAGNOSIS — D689 Coagulation defect, unspecified: Secondary | ICD-10-CM | POA: Diagnosis not present

## 2021-11-10 DIAGNOSIS — T8249XA Other complication of vascular dialysis catheter, initial encounter: Secondary | ICD-10-CM | POA: Diagnosis not present

## 2021-11-10 DIAGNOSIS — Z743 Need for continuous supervision: Secondary | ICD-10-CM | POA: Diagnosis not present

## 2021-11-10 DIAGNOSIS — Z992 Dependence on renal dialysis: Secondary | ICD-10-CM | POA: Diagnosis not present

## 2021-11-10 DIAGNOSIS — N2581 Secondary hyperparathyroidism of renal origin: Secondary | ICD-10-CM | POA: Diagnosis not present

## 2021-11-10 DIAGNOSIS — N186 End stage renal disease: Secondary | ICD-10-CM | POA: Diagnosis not present

## 2021-11-11 ENCOUNTER — Other Ambulatory Visit (INDEPENDENT_AMBULATORY_CARE_PROVIDER_SITE_OTHER): Payer: Self-pay | Admitting: Primary Care

## 2021-11-11 DIAGNOSIS — Z992 Dependence on renal dialysis: Secondary | ICD-10-CM

## 2021-11-11 DIAGNOSIS — I10 Essential (primary) hypertension: Secondary | ICD-10-CM | POA: Diagnosis not present

## 2021-11-11 DIAGNOSIS — E1022 Type 1 diabetes mellitus with diabetic chronic kidney disease: Secondary | ICD-10-CM

## 2021-11-12 DIAGNOSIS — I10 Essential (primary) hypertension: Secondary | ICD-10-CM | POA: Diagnosis not present

## 2021-11-12 DIAGNOSIS — N2581 Secondary hyperparathyroidism of renal origin: Secondary | ICD-10-CM | POA: Diagnosis not present

## 2021-11-12 DIAGNOSIS — N186 End stage renal disease: Secondary | ICD-10-CM | POA: Diagnosis not present

## 2021-11-12 DIAGNOSIS — R519 Headache, unspecified: Secondary | ICD-10-CM | POA: Diagnosis not present

## 2021-11-12 DIAGNOSIS — R531 Weakness: Secondary | ICD-10-CM | POA: Diagnosis not present

## 2021-11-12 DIAGNOSIS — D689 Coagulation defect, unspecified: Secondary | ICD-10-CM | POA: Diagnosis not present

## 2021-11-12 DIAGNOSIS — T8249XA Other complication of vascular dialysis catheter, initial encounter: Secondary | ICD-10-CM | POA: Diagnosis not present

## 2021-11-12 DIAGNOSIS — Z992 Dependence on renal dialysis: Secondary | ICD-10-CM | POA: Diagnosis not present

## 2021-11-12 DIAGNOSIS — Z743 Need for continuous supervision: Secondary | ICD-10-CM | POA: Diagnosis not present

## 2021-11-12 NOTE — Telephone Encounter (Signed)
Requested medications are due for refill today.  yes  Requested medications are on the active medications list.  yes  Last refill. 09/24/2021 #90 1refill  Future visit scheduled.   no  Notes to clinic.  Failed protocol d/t abnormal labs.    Requested Prescriptions  Pending Prescriptions Disp Refills   gabapentin (NEURONTIN) 100 MG capsule [Pharmacy Med Name: Gabapentin 100 MG Oral Capsule] 90 capsule 0    Sig: TAKE 1 CAPSULE BY MOUTH THREE TIMES DAILY     Neurology: Anticonvulsants - gabapentin Failed - 11/11/2021  5:28 PM      Failed - Cr in normal range and within 360 days    Creatinine  Date Value Ref Range Status  08/22/2014 5.00 (H) 0.60 - 1.30 mg/dL Final   Creatinine, Ser  Date Value Ref Range Status  08/11/2021 7.79 (H) 0.61 - 1.24 mg/dL Final          Passed - Completed PHQ-2 or PHQ-9 in the last 360 days      Passed - Valid encounter within last 12 months    Recent Outpatient Visits           7 months ago Hx of above knee amputation, left ([place])   [place] [facility] Juluis Mire P, NP   9 months ago Type 1 diabetes mellitus with other circulatory complication ([place])   [place] [facility] Juluis Mire P, NP   1 year ago Type 1 diabetes mellitus with other circulatory complication ([place])   [place], [name] P, NP   2 years ago Need for Tdap vaccination   [place], [name] P, NP              Signed Prescriptions Disp Refills   glucose blood (ONETOUCH VERIO) test strip 100 each 3    Sig: USE AS DIRECTED THREE TIMES DAILY     Endocrinology: Diabetes - Testing Supplies Passed - 11/11/2021  5:28 PM      Passed - Valid encounter within last 12 months    Recent Outpatient Visits           7 months ago Hx of above knee amputation, left ([place])   [place] [facility] Juluis Mire P, NP   9 months ago Type 1 diabetes mellitus with  other circulatory complication ([place])   [place] [facility] Juluis Mire P, NP   1 year ago Type 1 diabetes mellitus with other circulatory complication ([place])   [place] [facility] Kerin Perna, NP   2 years ago Need for Tdap vaccination   [place] Kerin Perna, NP

## 2021-11-12 NOTE — Telephone Encounter (Signed)
Requested Prescriptions  Pending Prescriptions Disp Refills   glucose blood (ONETOUCH VERIO) test strip [Pharmacy Med Name: OneTouch Verio In Vitro Strip] 100 each 3    Sig: USE AS DIRECTED THREE TIMES DAILY     Endocrinology: Diabetes - Testing Supplies Passed - 11/11/2021  5:28 PM      Passed - Valid encounter within last 12 months    Recent Outpatient Visits          7 months ago Hx of above knee amputation, left ([place])   [place] [facility] Kerin Perna, NP   9 months ago Type 1 diabetes mellitus with other circulatory complication ([place])   [place] [facility] Kerin Perna, NP   1 year ago Type 1 diabetes mellitus with other circulatory complication ([place])   [place] [facility] Kerin Perna, NP   2 years ago Need for Tdap vaccination   [place], [name] P, NP              gabapentin (NEURONTIN) 100 MG capsule [Pharmacy Med Name: Gabapentin 100 MG Oral Capsule] 90 capsule 0    Sig: TAKE 1 CAPSULE BY MOUTH THREE TIMES DAILY     Neurology: Anticonvulsants - gabapentin Failed - 11/11/2021  5:28 PM      Failed - Cr in normal range and within 360 days    Creatinine  Date Value Ref Range Status  08/22/2014 5.00 (H) 0.60 - 1.30 mg/dL Final   Creatinine, Ser  Date Value Ref Range Status  08/11/2021 7.79 (H) 0.61 - 1.24 mg/dL Final         Passed - Completed PHQ-2 or PHQ-9 in the last 360 days      Passed - Valid encounter within last 12 months    Recent Outpatient Visits          7 months ago Hx of above knee amputation, left ([place])   [place] [facility] Juluis Mire P, NP   9 months ago Type 1 diabetes mellitus with other circulatory complication ([place])   [place] Juluis Mire P, NP   1 year ago Type 1 diabetes mellitus with other circulatory complication ([place])   [place] Kerin Perna, NP   2  years ago Need for Tdap vaccination   [place] Kerin Perna, NP

## 2021-11-13 DIAGNOSIS — I10 Essential (primary) hypertension: Secondary | ICD-10-CM | POA: Diagnosis not present

## 2021-11-13 NOTE — Telephone Encounter (Signed)
Routed to PCP to refill  

## 2021-11-14 DIAGNOSIS — I10 Essential (primary) hypertension: Secondary | ICD-10-CM | POA: Diagnosis not present

## 2021-11-14 DIAGNOSIS — N186 End stage renal disease: Secondary | ICD-10-CM | POA: Diagnosis not present

## 2021-11-14 DIAGNOSIS — T8249XA Other complication of vascular dialysis catheter, initial encounter: Secondary | ICD-10-CM | POA: Diagnosis not present

## 2021-11-14 DIAGNOSIS — D689 Coagulation defect, unspecified: Secondary | ICD-10-CM | POA: Diagnosis not present

## 2021-11-14 DIAGNOSIS — Z743 Need for continuous supervision: Secondary | ICD-10-CM | POA: Diagnosis not present

## 2021-11-14 DIAGNOSIS — Z992 Dependence on renal dialysis: Secondary | ICD-10-CM | POA: Diagnosis not present

## 2021-11-14 DIAGNOSIS — R519 Headache, unspecified: Secondary | ICD-10-CM | POA: Diagnosis not present

## 2021-11-14 DIAGNOSIS — R531 Weakness: Secondary | ICD-10-CM | POA: Diagnosis not present

## 2021-11-14 DIAGNOSIS — N2581 Secondary hyperparathyroidism of renal origin: Secondary | ICD-10-CM | POA: Diagnosis not present

## 2021-11-15 DIAGNOSIS — I10 Essential (primary) hypertension: Secondary | ICD-10-CM | POA: Diagnosis not present

## 2021-11-16 DIAGNOSIS — I10 Essential (primary) hypertension: Secondary | ICD-10-CM | POA: Diagnosis not present

## 2021-11-17 DIAGNOSIS — Z743 Need for continuous supervision: Secondary | ICD-10-CM | POA: Diagnosis not present

## 2021-11-17 DIAGNOSIS — T8249XA Other complication of vascular dialysis catheter, initial encounter: Secondary | ICD-10-CM | POA: Diagnosis not present

## 2021-11-17 DIAGNOSIS — N186 End stage renal disease: Secondary | ICD-10-CM | POA: Diagnosis not present

## 2021-11-17 DIAGNOSIS — R531 Weakness: Secondary | ICD-10-CM | POA: Diagnosis not present

## 2021-11-17 DIAGNOSIS — E8770 Fluid overload, unspecified: Secondary | ICD-10-CM | POA: Diagnosis not present

## 2021-11-17 DIAGNOSIS — I10 Essential (primary) hypertension: Secondary | ICD-10-CM | POA: Diagnosis not present

## 2021-11-17 DIAGNOSIS — D689 Coagulation defect, unspecified: Secondary | ICD-10-CM | POA: Diagnosis not present

## 2021-11-17 DIAGNOSIS — N2581 Secondary hyperparathyroidism of renal origin: Secondary | ICD-10-CM | POA: Diagnosis not present

## 2021-11-17 DIAGNOSIS — Z992 Dependence on renal dialysis: Secondary | ICD-10-CM | POA: Diagnosis not present

## 2021-11-18 DIAGNOSIS — I10 Essential (primary) hypertension: Secondary | ICD-10-CM | POA: Diagnosis not present

## 2021-11-19 DIAGNOSIS — T8249XA Other complication of vascular dialysis catheter, initial encounter: Secondary | ICD-10-CM | POA: Diagnosis not present

## 2021-11-19 DIAGNOSIS — Z992 Dependence on renal dialysis: Secondary | ICD-10-CM | POA: Diagnosis not present

## 2021-11-19 DIAGNOSIS — N2581 Secondary hyperparathyroidism of renal origin: Secondary | ICD-10-CM | POA: Diagnosis not present

## 2021-11-19 DIAGNOSIS — E8770 Fluid overload, unspecified: Secondary | ICD-10-CM | POA: Diagnosis not present

## 2021-11-19 DIAGNOSIS — R531 Weakness: Secondary | ICD-10-CM | POA: Diagnosis not present

## 2021-11-19 DIAGNOSIS — N186 End stage renal disease: Secondary | ICD-10-CM | POA: Diagnosis not present

## 2021-11-19 DIAGNOSIS — Z743 Need for continuous supervision: Secondary | ICD-10-CM | POA: Diagnosis not present

## 2021-11-19 DIAGNOSIS — I10 Essential (primary) hypertension: Secondary | ICD-10-CM | POA: Diagnosis not present

## 2021-11-19 DIAGNOSIS — D689 Coagulation defect, unspecified: Secondary | ICD-10-CM | POA: Diagnosis not present

## 2021-11-20 ENCOUNTER — Other Ambulatory Visit: Payer: Self-pay

## 2021-11-20 ENCOUNTER — Ambulatory Visit: Payer: Medicare HMO | Admitting: Rehabilitation

## 2021-11-20 ENCOUNTER — Encounter: Payer: Self-pay | Admitting: Rehabilitation

## 2021-11-20 DIAGNOSIS — Z743 Need for continuous supervision: Secondary | ICD-10-CM | POA: Diagnosis not present

## 2021-11-20 DIAGNOSIS — R2689 Other abnormalities of gait and mobility: Secondary | ICD-10-CM | POA: Diagnosis not present

## 2021-11-20 DIAGNOSIS — R531 Weakness: Secondary | ICD-10-CM | POA: Diagnosis not present

## 2021-11-20 DIAGNOSIS — M6281 Muscle weakness (generalized): Secondary | ICD-10-CM

## 2021-11-20 DIAGNOSIS — M25652 Stiffness of left hip, not elsewhere classified: Secondary | ICD-10-CM | POA: Diagnosis not present

## 2021-11-20 DIAGNOSIS — R296 Repeated falls: Secondary | ICD-10-CM | POA: Diagnosis not present

## 2021-11-20 DIAGNOSIS — R2681 Unsteadiness on feet: Secondary | ICD-10-CM | POA: Diagnosis not present

## 2021-11-20 DIAGNOSIS — I10 Essential (primary) hypertension: Secondary | ICD-10-CM | POA: Diagnosis not present

## 2021-11-20 NOTE — Therapy (Signed)
[facility] [address] [place], [place], [address] Phone: [phone]   Fax:  [phone]  Physical Therapy Evaluation  Patient Details  Name: [name] MRN: [id number] Date of Birth: May 22, 1981 Referring Provider (PT): Pearson Grippe, MD   Encounter Date: 11/20/2021   PT End of Session - 11/20/21 1311     Visit Number 1    Number of Visits 25    Date for PT Re-Evaluation 02/18/22    Authorization Type [employer] Medicare and Medicaid    Progress Note Due on Visit 10    PT Start Time [id number]    PT Stop Time 0930    PT Time Calculation (min) 39 min    Activity Tolerance Patient tolerated treatment well    Behavior During Therapy [facility] for tasks assessed/performed             Past Medical History:  Diagnosis Date   Anemia    Cellulitis and abscess of toe of left foot    Diabetes mellitus without complication ([place])    Diabetic gastroparesis ([place])    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 Filetted toe flap metatarsal resection. Debridement Plantar Foot wound;  Surgeon: Evelina Bucy, DPM;  Location: [place];  Service: Podiatry;  Laterality: Right;   AMPUTATION Left 08/20/2018   Procedure: FIFTH METATARSAL BONE BIOPSY;  Surgeon: Evelina Bucy, DPM;  Location: [place];  Service: Podiatry;  Laterality: Left;   AMPUTATION Left 10/28/2018   Procedure: LEFT GREAT TOE AMPUTATION;  Surgeon: Evelina Bucy, DPM;  Location: [place];  Service: Podiatry;  Laterality: Left;   AMPUTATION Left 09/16/2020   Procedure: AMPUTATION BELOW KNEE;  Surgeon: Rosetta Posner, MD;  Location: [place];  Service: Vascular;  Laterality:  Left;   AMPUTATION Left 10/15/2020   Procedure: AMPUTATION ABOVE KNEE;  Surgeon: Elam Dutch, MD;  Location: [place];  Service: Vascular;  Laterality: Left;   APPLICATION OF WOUND VAC  02/02/2018   Procedure: APPLICATION OF WOUND VAC  Right Foot;  Surgeon: Evelina Bucy, DPM;  Location: [place];  Service: Podiatry;;   APPLICATION OF WOUND VAC Left 10/28/2018   Procedure: APPLICATION OF WOUND VAC LEFT TOE;  Surgeon: Evelina Bucy, DPM;  Location: [place];  Service: Podiatry;  Laterality: Left;   APPLICATION OF WOUND VAC Left 11/01/2018   Procedure: APPLICATION OF WOUND VAC;  Surgeon: Evelina Bucy, DPM;  Location: [place];  Service: Podiatry;  Laterality: Left;   AV FISTULA PLACEMENT     left arm.   AV FISTULA PLACEMENT Right 12/22/2016   Procedure: INSERTION OF ARTERIOVENOUS (AV) GORE-TEX GRAFT ARM;  Surgeon: Elam Dutch, MD;  Location: [place];  Service: Vascular;  Laterality: Right;   AV FISTULA PLACEMENT Left 05/26/2018   Procedure: INSERTION OF  ARTERIOVENOUS (AV) GORE-TEX GRAFT LEFT ARM;  Surgeon: Serafina Mitchell, MD;  Location: [place];  Service: Vascular;  Laterality: Left;   EYE SURGERY     I & D EXTREMITY Right 10/31/2017   Procedure: IRRIGATION AND DEBRIDEMENT RIGHT FOOT;  Surgeon: Evelina Bucy, DPM;  Location: [place];  Service:  Podiatry;  Laterality: Right;   I & D EXTREMITY Left 08/20/2018   Procedure: IRRIGATION AND DEBRIDEMENT EXTREMITY WITH SECONDARY WOUND CLOSUREAND APPLICATION OF WOUND VAC LEFT FOOT;  Surgeon: Evelina Bucy, DPM;  Location: [place];  Service: Podiatry;  Laterality: Left;   I & D EXTREMITY Left 10/20/2018   Procedure: IRRIGATION AND DEBRIDEMENT LEFT FOOT  DEBRIDEMENT LATERAL FOOT WOUND;  Surgeon: Evelina Bucy, DPM;  Location: [place];  Service: Podiatry;  Laterality: Left;   I & D EXTREMITY Left 10/28/2018   Procedure: IRRIGATION AND DEBRIDEMENT LEFT TOE;  Surgeon: Evelina Bucy, DPM;  Location: [place];  Service: Podiatry;  Laterality: Left;   I & D  EXTREMITY Left 09/14/2020   Procedure: IRRIGATION AND DEBRIDEMENT WRIST;  Surgeon: Leanora Cover, MD;  Location: [place];  Service: Orthopedics;  Laterality: Left;   INSERTION OF DIALYSIS CATHETER     Right subclavian   IR AV DIALY SHUNT INTRO NEEDLE/INTRACATH INITIAL W/PTA/IMG RIGHT Right 02/05/2018   IR FLUORO GUIDE CV LINE RIGHT  01/31/2020   IR THROMBECTOMY AV FISTULA W/THROMBOLYSIS/PTA INC/SHUNT/IMG LEFT Left 08/24/2018   IR THROMBECTOMY AV FISTULA W/THROMBOLYSIS/PTA INC/SHUNT/IMG LEFT Left 01/06/2019   IR US GUIDE VASC ACCESS LEFT  08/24/2018   IR US GUIDE VASC ACCESS RIGHT  02/05/2018   IR US GUIDE VASC ACCESS RIGHT  01/31/2020   IRRIGATION AND DEBRIDEMENT FOOT Right 10/23/2018   Procedure: Irrigation and Debridement to tendon, Left Foot;  Surgeon: Evelina Bucy, DPM;  Location: [place];  Service: Podiatry;  Laterality: Right;   IRRIGATION AND DEBRIDEMENT FOOT Left 11/01/2018   Procedure: IRRIGATION AND DEBRIDEMENT PARTIAL WOUND CLOSURE LOCAL TISSUE TRANSFER AND FLAP ROTATION, LEFT FOOT;  Surgeon: Evelina Bucy, DPM;  Location: [place];  Service: Podiatry;  Laterality: Left;   TRANSMETATARSAL AMPUTATION N/A 08/18/2018   Procedure: IRRIGATION AND DEBRIDEMENT OF LEFT 5TH TOE AND TRANSMETATARSAL, WITH PARTICAL LEFT 5TH TOE AND METATARSAL AMPUTATION, BONE BIOPSY, WOUND VAC APPLICATION.;  Surgeon: Evelina Bucy, DPM;  Location: [place];  Service: Podiatry;  Laterality: N/A;   UPPER EXTREMITY VENOGRAPHY N/A 11/16/2016   Procedure: Upper Extremity Venography - Right Central;  Surgeon: Elam Dutch, MD;  Location: [place] [facility];  Service: Cardiovascular;  Laterality: N/A;   UPPER EXTREMITY VENOGRAPHY N/A 05/25/2018   Procedure: UPPER EXTREMITY VENOGRAPHY - Bilateral;  Surgeon: Marty Heck, MD;  Location: [place] [facility];  Service: Cardiovascular;  Laterality: N/A;    There were no vitals filed for this visit.    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  Is needing to get stronger to get catheter taken out of chest.  Pt got prosthesis in Jan.    Patient is accompained by: --   Goes by [name]   Pertinent History DMI, HTN, ESRD on dialysis (MWF)    Limitations Standing;Walking;House hold activities    How long can you stand comfortably? Has stood one time at home    Patient Stated Goals "I want to be able to be as independent as possible and move around like this is my leg."    Currently in Pain? Yes    Pain Score 7     Pain Location Leg    Pain Orientation Left    Pain Descriptors / Indicators Sharp    Pain Type Neuropathic  pain    Pain Onset More than a month ago    Pain Frequency Intermittent    Aggravating Factors  nothing    Pain Relieving Factors massage thigh                [facility] PT Assessment - 11/20/21 0900       Assessment   Medical Diagnosis L AKA    Referring Provider (PT) Pearson Grippe, MD    Onset Date/Surgical Date --   Jan 2022 was amp, got prosthesis in Jan 2023   Hand Dominance Right    Prior Therapy HHPT after amputation      Precautions   Precautions Fall      Restrictions   Weight Bearing Restrictions No      Balance Screen   Has the patient fallen in the past 6 months Yes    How many times? 5    Has the patient had a decrease in activity level because of a fear of falling?  Yes    Is the patient reluctant to leave their home because of a fear of falling?  Yes      [place] residence    Living Arrangements Spouse/significant other   aunt intermittently   Available Help at Discharge Available PRN/intermittently    Type of [place] to enter    Entrance Stairs-Number of Steps [place] One level    [place] - 2 wheels;Wheelchair - manual;Tub bench    Additional Comments Has a home health  aide daily 2 hrs      Prior Function   Level of Independence Independent    Vocation Unemployed    Leisure Wants to get back to driving      Cognition   Overall Cognitive Status Within Functional Limits for tasks assessed      Sensation   Light Touch Appears Intact    Hot/Cold Appears Intact      Coordination   Gross Motor Movements are Fluid and Coordinated Yes      ROM / Strength   AROM / PROM / Strength Strength;AROM      AROM   Overall AROM Comments grossly WFL, slight tightness noted in L hip      Strength   Overall Strength Deficits    Overall Strength Comments hip flex 3+/5, hip ext 4/5, hip abd/add 4/5 (all in seated position).      Transfers   Transfers Sit to Stand;Stand to Sit    Sit to Stand 4: Min guard    Stand to Sit 4: Min guard    Transfer Cueing Cues for hand placement and safety with prosthetic placement when sitting/standing.  His knee will unlock with unweighting.      Ambulation/Gait   Ambulation/Gait Yes    Ambulation/Gait Assistance 4: Min guard;4: Min assist    Ambulation/Gait Assistance Details Cues for shorter prosthetic step, kicking prosthesis forward, and taking longer step on RLE.  Also provided cues for upright posture and improved forward weight shift over prosthesis.    Ambulation Distance (Feet) 150 Feet    Assistive device Rolling walker    Gait Pattern Step-through pattern;Decreased stance time - left;Decreased weight shift to left;Left circumduction;Lateral hip instability;Trunk flexed    Ambulation Surface Level;Indoor    Gait velocity 0.30 ft/sec with RW and min A  Prosthetics Assessment - 11/20/21 0001       Prosthetics   Prosthetic Care Dependent with Residual limb care;Skin check;Prosthetic cleaning;Ply sock cleaning;Correct ply sock adjustment;Proper wear schedule/adjustment;Proper weight-bearing schedule/adjustment    Prosthetic Care Comments  Educated pt on beginning to wear prosthesis 2 hrs, 2x/day with at  least 1-2 hours in between and that he would not be weight bearing that entire time but can WB up to several mins at a time.  Also educated on residual limb check esp now that he will be wearing leg more.  Educated on purpose of socks and how limb volume may fluctuate throughout the day but especially for him due to dialysis.  He had questions about getting design on leg.  He thought that he could get a design on the pylon, but educated that it is socket but he could get a soft cover for pylon to look more like a leg.  He reports he is interested in that and so recommended he contact Ronalee Belts at [employer] to address this.    Donning prosthesis  Supervision;Min assist    Doffing prosthesis  Modified independent (Device/Increase time)    Current prosthetic wear tolerance (days/week)  intermittently wearing 1-2 hours this past weekend    Current prosthetic wear tolerance (#hours/day)  only started wearing this past weekend    Current prosthetic weight-bearing tolerance (hours/day)  4-5 mins    Edema Mild edema noted    Residual limb condition  Note area of pointed skin medially at end of residual limb.  He reports he is not wearing shrinker at all times, therefore encouraged this.  Also note small area of puckering in middle of incision area.  Educated to ensure that this remained clean and dry.    K code/activity level with prosthetic use  Capability of K3 with variable cadence.                       Objective measurements completed on examination: See above findings.                PT Education - 11/20/21 1310     Education Details see prosthetic education, evaluation results, POC, goals, doing stairs at next visit as I do not feel that he will qualify for non emergent ambulance transport for much longer.    Person(s) Educated Patient    Methods Explanation    Comprehension Verbalized understanding              PT Short Term Goals - 11/20/21 1319       PT SHORT  TERM GOAL #1   Title Pt will be IND with initial HEP in order to indicate improved functional mobility.  (Target Date: 12/20/21)    Time 4    Period Weeks    Status New    Target Date 12/20/21      PT SHORT TERM GOAL #2   Title Pt will perform BERG balance test and improve score by 4-6 points from baseline in order to indicate dec fall risk.    Time 4    Period Weeks    Status New      PT SHORT TERM GOAL #3   Title Pt will tolerate wearing prosthesis >/=75% of awake hours without skin issues and demonstrate understanding of sock ply adjustment and donning prosthesis with no more than 25% cuing.    Time 4    Period Weeks    Status New  PT SHORT TERM GOAL #4   Title Pt will ambulate x 200' with RW at S level over indoor surfaces in order to indicate safety with ambulation in house.    Time 4    Period Weeks    Status New      PT SHORT TERM GOAL #5   Title Pt will negotiate up/down 4 stairs with single rail, up/down curb and ramp and over 200' of uneven outdoor paved surfaces with LRAD at S level in order to indicate safety with home and community entry/exit.    Time 4    Period Weeks    Status New      Additional Short Term Goals   Additional Short Term Goals Yes      PT SHORT TERM GOAL #6   Title Pt will improve gait speed to >/=1.0 ft/sec with RW in order to indicate dec fall risk.    Time 4    Period Weeks    Status New               PT Long Term Goals - 11/20/21 1538       PT LONG TERM GOAL #1   Title Pt will be IND with final HEP in order to indicate improved functional mobility and balance. (Target Date: 01/19/22, new LTGs to be set for 02/18/22)    Time 8    Period Weeks    Status New    Target Date 01/19/22      PT LONG TERM GOAL #2   Title Pt will demonstrate 8-10 point improvement from baseline BERG score in order to demonstrate dec fall risk    Time 8    Period Weeks    Status New      PT LONG TERM GOAL #3   Title Pt will ambulate x 500' over  indoor and outdoor unlevel paved surfaces, negotiate ramps and curbs, with LRAD at mod I level in order to indicate improved endurance and ability to perform community tasks.    Time 8    Period Weeks    Status New      PT LONG TERM GOAL #4   Title Pt will improve gait speed to >/=2.0 ft/sec with RW in order to indicate dec fall risk.    Time 8    Period Weeks    Status New      PT LONG TERM GOAL #5   Title Pt will tolerate wearing prosthesis 90% of awake hours without skin issues, demonstrate ability to adjust socks appropriately and don/doff prosthesis without cuing.    Time 8    Period Weeks    Status New      Additional Long Term Goals   Additional Long Term Goals Yes      PT LONG TERM GOAL #6   Title Pt will ambulate x 50' w/ quad tip cane vs single crutch at min A level in order to indicate improved independence with gait.    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  He reports that he has had a lot going on both medically and personally in the last year and is just  now getting to get to therapy.  He has a significant history with DMI, ESRD and on dialysis MWF, HTN, and fall in Nov of 2022.  Provided a lot of prosthetic education to pt as he reports that when he received leg, he didn't get a lot of education.  See prosthetic section for details.  We were able to ambulate during session with a gait speed of .30 ft/sec with RW making him a significant fall risk.  Will assess balance and endurance more formally at next session.  Pt will benefit from skilled OP neuro PT in order to address deficits.    Personal Factors and Comorbidities Comorbidity 3+;Time since onset of injury/illness/exacerbation    Comorbidities see above    Examination-Activity Limitations Locomotion  Level;Squat;Stairs;Stand;Transfers;Carry    Examination-Participation Restrictions Community Activity;Driving;Cleaning;Laundry;Meal Prep    Stability/Clinical Decision Making Evolving/Moderate complexity    Clinical Decision Making Moderate    Rehab Potential Good    PT Frequency 2x / week    PT Duration 12 weeks    PT Treatment/Interventions ADLs/Self Care Home Management;DME Instruction;Gait training;Stair training;Functional mobility training;Therapeutic activities;Therapeutic exercise;Balance training;Neuromuscular re-education;Patient/family education;Prosthetic Training;Passive range of motion;Energy conservation;Vestibular    PT Next Visit Plan BERG update goal if needed, Increase wear time if he is consistently wearing 2hrs, 2x/day without issues, DO STAIRS!! he will need to be able to do 4 stairs with single rail, continue prosthetic education, give sink HEP    Consulted and Agree with Plan of Care Patient             Patient will benefit from skilled therapeutic intervention in order to improve the following deficits and impairments:  Abnormal gait, Decreased activity tolerance, Decreased balance, Decreased endurance, Decreased knowledge of precautions, Decreased knowledge of use of DME, Decreased mobility, Decreased range of motion, Decreased safety awareness, Decreased strength, Impaired perceived functional ability, Impaired flexibility, Postural dysfunction, Prosthetic Dependency  Visit Diagnosis: Unsteadiness on feet  Muscle weakness (generalized)  Other abnormalities of gait and mobility  Stiffness of left hip, not elsewhere classified  Repeated falls     Problem List Patient Active Problem List   Diagnosis Date Noted   DKA (diabetic ketoacidosis) ([place]) 08/09/2021   Cellulitis of right leg 05/19/2021   Pressure injury of skin 10/15/2020   Adjustment disorder, unspecified 09/21/2020   Malnutrition of moderate degree 09/13/2020   Charcot's joint of foot, left     Pneumonia due to COVID-19 virus 02/14/2020   Allergy, unspecified, initial encounter 07/17/2019   Diarrhea 04/24/2019   Encounter for orthopedic aftercare following surgical amputation    DM (diabetes mellitus), secondary, uncontrolled, with neurologic complications    Headache 09/23/2018   Anemia of chronic disease 08/18/2018   Unspecified protein-calorie malnutrition ([place]) 02/14/2018   Type 1 diabetes mellitus with complication, without long-term current use of insulin ([place]) 10/25/2017   Diabetic gastroparesis ([place]) 10/25/2017   Iron deficiency anemia, unspecified 10/28/2016   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

## 2021-11-21 DIAGNOSIS — Z992 Dependence on renal dialysis: Secondary | ICD-10-CM | POA: Diagnosis not present

## 2021-11-21 DIAGNOSIS — T8249XA Other complication of vascular dialysis catheter, initial encounter: Secondary | ICD-10-CM | POA: Diagnosis not present

## 2021-11-21 DIAGNOSIS — I10 Essential (primary) hypertension: Secondary | ICD-10-CM | POA: Diagnosis not present

## 2021-11-21 DIAGNOSIS — R531 Weakness: Secondary | ICD-10-CM | POA: Diagnosis not present

## 2021-11-21 DIAGNOSIS — E8770 Fluid overload, unspecified: Secondary | ICD-10-CM | POA: Diagnosis not present

## 2021-11-21 DIAGNOSIS — D689 Coagulation defect, unspecified: Secondary | ICD-10-CM | POA: Diagnosis not present

## 2021-11-21 DIAGNOSIS — Z743 Need for continuous supervision: Secondary | ICD-10-CM | POA: Diagnosis not present

## 2021-11-21 DIAGNOSIS — N2581 Secondary hyperparathyroidism of renal origin: Secondary | ICD-10-CM | POA: Diagnosis not present

## 2021-11-21 DIAGNOSIS — N186 End stage renal disease: Secondary | ICD-10-CM | POA: Diagnosis not present

## 2021-11-22 DIAGNOSIS — Z992 Dependence on renal dialysis: Secondary | ICD-10-CM | POA: Diagnosis not present

## 2021-11-22 DIAGNOSIS — N186 End stage renal disease: Secondary | ICD-10-CM | POA: Diagnosis not present

## 2021-11-22 DIAGNOSIS — T8249XA Other complication of vascular dialysis catheter, initial encounter: Secondary | ICD-10-CM | POA: Diagnosis not present

## 2021-11-22 DIAGNOSIS — I10 Essential (primary) hypertension: Secondary | ICD-10-CM | POA: Diagnosis not present

## 2021-11-22 DIAGNOSIS — J80 Acute respiratory distress syndrome: Secondary | ICD-10-CM | POA: Diagnosis not present

## 2021-11-22 DIAGNOSIS — N2581 Secondary hyperparathyroidism of renal origin: Secondary | ICD-10-CM | POA: Diagnosis not present

## 2021-11-22 DIAGNOSIS — R531 Weakness: Secondary | ICD-10-CM | POA: Diagnosis not present

## 2021-11-22 DIAGNOSIS — E8770 Fluid overload, unspecified: Secondary | ICD-10-CM | POA: Diagnosis not present

## 2021-11-22 DIAGNOSIS — Z743 Need for continuous supervision: Secondary | ICD-10-CM | POA: Diagnosis not present

## 2021-11-22 DIAGNOSIS — D689 Coagulation defect, unspecified: Secondary | ICD-10-CM | POA: Diagnosis not present

## 2021-11-23 DIAGNOSIS — I10 Essential (primary) hypertension: Secondary | ICD-10-CM | POA: Diagnosis not present

## 2021-11-24 ENCOUNTER — Other Ambulatory Visit (INDEPENDENT_AMBULATORY_CARE_PROVIDER_SITE_OTHER): Payer: Self-pay | Admitting: Primary Care

## 2021-11-24 ENCOUNTER — Telehealth (INDEPENDENT_AMBULATORY_CARE_PROVIDER_SITE_OTHER): Payer: Self-pay | Admitting: Primary Care

## 2021-11-24 DIAGNOSIS — Z992 Dependence on renal dialysis: Secondary | ICD-10-CM | POA: Diagnosis not present

## 2021-11-24 DIAGNOSIS — R531 Weakness: Secondary | ICD-10-CM | POA: Diagnosis not present

## 2021-11-24 DIAGNOSIS — N186 End stage renal disease: Secondary | ICD-10-CM | POA: Diagnosis not present

## 2021-11-24 DIAGNOSIS — I10 Essential (primary) hypertension: Secondary | ICD-10-CM | POA: Diagnosis not present

## 2021-11-24 DIAGNOSIS — N2581 Secondary hyperparathyroidism of renal origin: Secondary | ICD-10-CM | POA: Diagnosis not present

## 2021-11-24 DIAGNOSIS — E118 Type 2 diabetes mellitus with unspecified complications: Secondary | ICD-10-CM

## 2021-11-24 DIAGNOSIS — D689 Coagulation defect, unspecified: Secondary | ICD-10-CM | POA: Diagnosis not present

## 2021-11-24 DIAGNOSIS — Z743 Need for continuous supervision: Secondary | ICD-10-CM | POA: Diagnosis not present

## 2021-11-24 MED ORDER — INSULIN ASPART 100 UNIT/ML IJ SOLN: 10.0000 [IU] | Freq: Three times a day (TID) | INTRAMUSCULAR | 3 refills | Status: DC

## 2021-11-24 NOTE — Telephone Encounter (Signed)
Please send as patient is out.

## 2021-11-24 NOTE — Telephone Encounter (Signed)
Patient states he contacted his pharmacy over the weekend and though he had refills of insulin aspart (NOVOLOG) 100 UNIT/ML injection., patient states she would like this request expedited.Informed patient please call the pharmacy in advise because his provider has 48 to 72 hour turn around time   [place], [place] Phone:  [phone]  Fax:  [phone]

## 2021-11-25 DIAGNOSIS — N186 End stage renal disease: Secondary | ICD-10-CM | POA: Diagnosis not present

## 2021-11-25 DIAGNOSIS — I10 Essential (primary) hypertension: Secondary | ICD-10-CM | POA: Diagnosis not present

## 2021-11-25 DIAGNOSIS — Z992 Dependence on renal dialysis: Secondary | ICD-10-CM | POA: Diagnosis not present

## 2021-11-25 DIAGNOSIS — I129 Hypertensive chronic kidney disease with stage 1 through stage 4 chronic kidney disease, or unspecified chronic kidney disease: Secondary | ICD-10-CM | POA: Diagnosis not present

## 2021-11-26 DIAGNOSIS — R531 Weakness: Secondary | ICD-10-CM | POA: Diagnosis not present

## 2021-11-26 DIAGNOSIS — N2581 Secondary hyperparathyroidism of renal origin: Secondary | ICD-10-CM | POA: Diagnosis not present

## 2021-11-26 DIAGNOSIS — N186 End stage renal disease: Secondary | ICD-10-CM | POA: Diagnosis not present

## 2021-11-26 DIAGNOSIS — Z743 Need for continuous supervision: Secondary | ICD-10-CM | POA: Diagnosis not present

## 2021-11-26 DIAGNOSIS — I10 Essential (primary) hypertension: Secondary | ICD-10-CM | POA: Diagnosis not present

## 2021-11-26 DIAGNOSIS — Z992 Dependence on renal dialysis: Secondary | ICD-10-CM | POA: Diagnosis not present

## 2021-11-26 DIAGNOSIS — D689 Coagulation defect, unspecified: Secondary | ICD-10-CM | POA: Diagnosis not present

## 2021-11-27 ENCOUNTER — Other Ambulatory Visit: Payer: Self-pay

## 2021-11-27 ENCOUNTER — Ambulatory Visit: Payer: Medicare HMO | Attending: Primary Care

## 2021-11-27 DIAGNOSIS — M25652 Stiffness of left hip, not elsewhere classified: Secondary | ICD-10-CM | POA: Insufficient documentation

## 2021-11-27 DIAGNOSIS — R2689 Other abnormalities of gait and mobility: Secondary | ICD-10-CM | POA: Insufficient documentation

## 2021-11-27 DIAGNOSIS — M6281 Muscle weakness (generalized): Secondary | ICD-10-CM | POA: Diagnosis not present

## 2021-11-27 DIAGNOSIS — Z743 Need for continuous supervision: Secondary | ICD-10-CM | POA: Diagnosis not present

## 2021-11-27 DIAGNOSIS — R2681 Unsteadiness on feet: Secondary | ICD-10-CM | POA: Diagnosis not present

## 2021-11-27 DIAGNOSIS — I10 Essential (primary) hypertension: Secondary | ICD-10-CM | POA: Diagnosis not present

## 2021-11-27 DIAGNOSIS — R5383 Other fatigue: Secondary | ICD-10-CM | POA: Diagnosis not present

## 2021-11-27 DIAGNOSIS — R296 Repeated falls: Secondary | ICD-10-CM | POA: Diagnosis not present

## 2021-11-27 DIAGNOSIS — R531 Weakness: Secondary | ICD-10-CM | POA: Diagnosis not present

## 2021-11-27 NOTE — Therapy (Addendum)
OUTPATIENT PHYSICAL THERAPY TREATMENT NOTE   Patient Name: [name] MRN: [id number] DOB:[birth year], 41 y.o., male Today's Date: 11/27/2021  PCP: Kerin Perna, NP REFERRING PROVIDER: Pearson Grippe   PT End of Session - 11/27/21 0924     Visit Number 2    Number of Visits 25    Date for PT Re-Evaluation 02/18/22    Authorization Type [employer] and Medicaid    Progress Note Due on Visit 10    PT Start Time 0924    PT Stop Time 1015    PT Time Calculation (min) 51 min    Equipment Utilized During Treatment Gait belt    Activity Tolerance Patient tolerated treatment well    Behavior During Therapy WFL for tasks assessed/performed             Past Medical History:  Diagnosis Date   Anemia    Cellulitis and abscess of toe of left foot    Diabetes mellitus without complication ([place])    Diabetic gastroparesis ([place])    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 Filetted toe flap metatarsal resection. Debridement Plantar Foot wound;  Surgeon: Evelina Bucy, DPM;  Location: [place];  Service: Podiatry;  Laterality: Right;   AMPUTATION Left 08/20/2018   Procedure: FIFTH METATARSAL BONE BIOPSY;  Surgeon: Evelina Bucy, DPM;  Location: [place];  Service: Podiatry;  Laterality: Left;   AMPUTATION Left 10/28/2018   Procedure: LEFT GREAT TOE AMPUTATION;  Surgeon: Evelina Bucy, DPM;  Location: [place];  Service: Podiatry;  Laterality: Left;   AMPUTATION Left 09/16/2020   Procedure: AMPUTATION BELOW KNEE;  Surgeon: Rosetta Posner, MD;  Location: [place];  Service: Vascular;  Laterality: Left;   AMPUTATION Left 10/15/2020   Procedure: AMPUTATION ABOVE KNEE;  Surgeon:  Elam Dutch, MD;  Location: [place];  Service: Vascular;  Laterality: Left;   APPLICATION OF WOUND VAC  02/02/2018   Procedure: APPLICATION OF WOUND VAC  Right Foot;  Surgeon: Evelina Bucy, DPM;  Location: [place];  Service: Podiatry;;   APPLICATION OF WOUND VAC Left 10/28/2018   Procedure: APPLICATION OF WOUND VAC LEFT TOE;  Surgeon: Evelina Bucy, DPM;  Location: [place];  Service: Podiatry;  Laterality: Left;   APPLICATION OF WOUND VAC Left 11/01/2018   Procedure: APPLICATION OF WOUND VAC;  Surgeon: Evelina Bucy, DPM;  Location: [place];  Service: Podiatry;  Laterality: Left;   AV FISTULA PLACEMENT     left arm.   AV FISTULA PLACEMENT Right 12/22/2016   Procedure: INSERTION OF ARTERIOVENOUS (AV) GORE-TEX GRAFT ARM;  Surgeon: Elam Dutch, MD;  Location: [place];  Service: Vascular;  Laterality: Right;   AV FISTULA PLACEMENT Left 05/26/2018   Procedure: INSERTION OF  ARTERIOVENOUS (AV) GORE-TEX GRAFT LEFT ARM;  Surgeon: Serafina Mitchell, MD;  Location[place];  Service: Vascular;  Laterality: Left;   EYE SURGERY     I & D EXTREMITY Right 10/31/2017   Procedure: IRRIGATION AND DEBRIDEMENT RIGHT FOOT;  Surgeon: Evelina Bucy, DPM;  Location: [place];  Service: Podiatry;  Laterality: Right;   I & D EXTREMITY Left  08/20/2018   Procedure: IRRIGATION AND DEBRIDEMENT EXTREMITY WITH SECONDARY WOUND CLOSUREAND APPLICATION OF WOUND VAC LEFT FOOT;  Surgeon: Evelina Bucy, DPM;  Location: [place];  Service: Podiatry;  Laterality: Left;   I & D EXTREMITY Left 10/20/2018   Procedure: IRRIGATION AND DEBRIDEMENT LEFT FOOT  DEBRIDEMENT LATERAL FOOT WOUND;  Surgeon: Evelina Bucy, DPM;  Location: [place];  Service: Podiatry;  Laterality: Left;   I & D EXTREMITY Left 10/28/2018   Procedure: IRRIGATION AND DEBRIDEMENT LEFT TOE;  Surgeon: Evelina Bucy, DPM;  Location: [place];  Service: Podiatry;  Laterality: Left;   I & D EXTREMITY Left 09/14/2020   Procedure: IRRIGATION AND DEBRIDEMENT WRIST;  Surgeon:  Leanora Cover, MD;  Location: [place];  Service: Orthopedics;  Laterality: Left;   INSERTION OF DIALYSIS CATHETER     Right subclavian   IR AV DIALY SHUNT INTRO NEEDLE/INTRACATH INITIAL W/PTA/IMG RIGHT Right 02/05/2018   IR FLUORO GUIDE CV LINE RIGHT  01/31/2020   IR THROMBECTOMY AV FISTULA W/THROMBOLYSIS/PTA INC/SHUNT/IMG LEFT Left 08/24/2018   IR THROMBECTOMY AV FISTULA W/THROMBOLYSIS/PTA INC/SHUNT/IMG LEFT Left 01/06/2019   IR US GUIDE VASC ACCESS LEFT  08/24/2018   IR US GUIDE VASC ACCESS RIGHT  02/05/2018   IR US GUIDE VASC ACCESS RIGHT  01/31/2020   IRRIGATION AND DEBRIDEMENT FOOT Right 10/23/2018   Procedure: Irrigation and Debridement to tendon, Left Foot;  Surgeon: Evelina Bucy, DPM;  Location: [place];  Service: Podiatry;  Laterality: Right;   IRRIGATION AND DEBRIDEMENT FOOT Left 11/01/2018   Procedure: IRRIGATION AND DEBRIDEMENT PARTIAL WOUND CLOSURE LOCAL TISSUE TRANSFER AND FLAP ROTATION, LEFT FOOT;  Surgeon: Evelina Bucy, DPM;  Location: [place];  Service: Podiatry;  Laterality: Left;   TRANSMETATARSAL AMPUTATION N/A 08/18/2018   Procedure: IRRIGATION AND DEBRIDEMENT OF LEFT 5TH TOE AND TRANSMETATARSAL, WITH PARTICAL LEFT 5TH TOE AND METATARSAL AMPUTATION, BONE BIOPSY, WOUND VAC APPLICATION.;  Surgeon: Evelina Bucy, DPM;  Location: [place];  Service: Podiatry;  Laterality: N/A;   UPPER EXTREMITY VENOGRAPHY N/A 11/16/2016   Procedure: Upper Extremity Venography - Right Central;  Surgeon: Elam Dutch, MD;  Location: [place] [facility];  Service: Cardiovascular;  Laterality: N/A;   UPPER EXTREMITY VENOGRAPHY N/A 05/25/2018   Procedure: UPPER EXTREMITY VENOGRAPHY - Bilateral;  Surgeon: Marty Heck, MD;  Location: [place] [facility];  Service: Cardiovascular;  Laterality: N/A;   Patient Active Problem List   Diagnosis Date Noted   DKA (diabetic ketoacidosis) ([place]) 08/09/2021   Cellulitis of right leg 05/19/2021   Pressure injury of skin 10/15/2020   Adjustment disorder,  unspecified 09/21/2020   Malnutrition of moderate degree 09/13/2020   Charcot's joint of foot, left    Pneumonia due to COVID-19 virus 02/14/2020   Allergy, unspecified, initial encounter 07/17/2019   Diarrhea 04/24/2019   Encounter for orthopedic aftercare following surgical amputation    DM (diabetes mellitus), secondary, uncontrolled, with neurologic complications    Headache 09/23/2018   Anemia of chronic disease 08/18/2018   Unspecified protein-calorie malnutrition ([place]) 02/14/2018   Type 1 diabetes mellitus with complication, without long-term current use of insulin ([place]) 10/25/2017   Diabetic gastroparesis ([place]) 10/25/2017   Iron deficiency anemia, unspecified 10/28/2016   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 He reports that he is wearing leg about 2.5 hours/day. Reports skin is doing well. Pt voicing his frustrations about how leg looks.  PAIN:  Are you having pain? Yes NPRS scale: 8/10 but comes and goes Pain location: left residual limb Pain orientation:  PAIN TYPE: sharp, phantom pains Pain description: sharp  Aggravating factors: phantom pains Relieving factors: takes gabapentin    TODAY'S TREATMENT:  BP=128/70 Pt performed stair 4 steps x 2 with left rail in step-to pattern with verbal cues for sequence CGA/SBA. Transfer sit to stand at walker with UE support supervision. Squat/pivot w/c to chair supervision. Ambulated 50' x 2 with RW CGA with cues for upright posture and to shift over prosthesis.  [facility] Adult PT Treatment/Exercise -  11/27/21 0954       Standardized Balance Assessment   Standardized Balance Assessment Berg Balance Test      Berg Balance Test   Sit to Stand Able to stand  independently using hands    Standing Unsupported Unable to stand 30 seconds unassisted    Sitting with Back Unsupported but Feet Supported on Floor or Stool Able to sit safely and securely 2 minutes    Stand to Sit Controls descent by using hands    Transfers Able to transfer safely, definite need of hands    Standing Unsupported with Eyes Closed Needs help to keep from falling    Standing Ubsupported with Feet Together Needs help to attain position and unable to hold for 15 seconds    From Standing, Reach Forward with Outstretched Arm Reaches forward but needs supervision    From Standing Position, Pick up Object from Floor Unable to try/needs assist to keep balance    From Standing Position, Turn to Look Behind Over each Shoulder Needs supervision when turning    Turn 360 Degrees Needs assistance while turning    Standing Unsupported, Alternately Place Feet on Step/Stool Able to complete >2 steps/needs minimal assist    Standing Unsupported, One Foot in [employer] balance while stepping or standing    Standing on One Leg Unable to try or needs assist to prevent fall    Total Score 16              Prosthetics: Prosthetic care comments: PT listened to pt's concerns with how prosthesis looks with clothes as he has always really been in to fashion. Reports it depresses him on how it looks. PT discussed that he can talk with prosthetist about a cover that may help but leg will be bulkier and different than his normal leg. Pt denies any issues with sweating currently. PT also observed right foot and pt has history of 5th toe amputation. Discussed importance of daily skin checks and pt reporting that he does not do but aware he should. Blood sugar has been up and down. Discussed needing to keep <300 to participate with exercise as can  actually raise sugar and contraindicator for exercise at that time. Educated to increase wear time to 2 hours, 2x/day. Donning prosthesis:  not assessed today due to time Doffing prosthesis:  not assessed today due to time  Prosthetic wear tolerance: 2.5 hours/day Residual limb condition: Pt reports skin is intact.     PATIENT EDUCATION: Education details: Prosthetic education. Stair training. Person educated: Patient Education method: Explanation Education comprehension: verbalized understanding  and needs further education   HOME EXERCISE PROGRAM:    PT Short Term Goals -      PT SHORT TERM GOAL #1   Title Pt will be IND with initial HEP in order to indicate improved functional mobility.  (Target Date: 12/20/21)    Time 4    Period Weeks    Status New    Target Date 12/20/21      PT SHORT TERM GOAL #2   Title Pt will perform BERG balance test and improve score by 4-6 points from baseline in order to indicate dec fall risk.    Baseline 11/27/21 16/56    Time 4    Period Weeks    Status New      PT SHORT TERM GOAL #3   Title Pt will tolerate wearing prosthesis >/=75% of awake hours without skin issues and demonstrate understanding of sock ply adjustment and donning prosthesis with no more than 25% cuing.    Time 4    Period Weeks    Status New      PT SHORT TERM GOAL #4   Title Pt will ambulate x 200' with RW at S level over indoor surfaces in order to indicate safety with ambulation in house.    Time 4    Period Weeks    Status New      PT SHORT TERM GOAL #5   Title Pt will negotiate up/down 4 stairs with single rail, up/down curb and ramp and over 200' of uneven outdoor paved surfaces with LRAD at S level in order to indicate safety with home and community entry/exit.    Time 4    Period Weeks    Status New      PT SHORT TERM GOAL #6   Title Pt will improve gait speed to >/=1.0 ft/sec with RW in order to indicate dec fall risk.    Time 4    Period Weeks     Status New              PT Long Term Goals -       PT LONG TERM GOAL #1   Title Pt will be IND with final HEP in order to indicate improved functional mobility and balance. (Target Date: 01/19/22, new LTGs to be set for 02/18/22)    Time 8    Period Weeks    Status New    Target Date 01/19/22      PT LONG TERM GOAL #2   Title Pt will demonstrate 8-10 point improvement from baseline BERG score in order to demonstrate dec fall risk    Baseline 11/27/21 16/56    Time 8    Period Weeks    Status New      PT LONG TERM GOAL #3   Title Pt will ambulate x 500' over indoor and outdoor unlevel paved surfaces, negotiate ramps and curbs, with LRAD at mod I level in order to indicate improved endurance and ability to perform community tasks.    Time 8    Period Weeks    Status New      PT LONG TERM GOAL #4   Title Pt will improve gait speed to >/=2.0 ft/sec with RW in order to indicate dec fall risk.    Time 8    Period Weeks    Status New      PT LONG TERM GOAL #5   Title Pt will tolerate wearing prosthesis 90% of awake hours without  skin issues, demonstrate ability to adjust socks appropriately and don/doff prosthesis without cuing.    Time 8    Period Weeks    Status New      PT LONG TERM GOAL #6   Title Pt will ambulate x 50' w/ quad tip cane vs single crutch at min A level in order to indicate improved independence with gait.    Time 8    Period Weeks    Status New              Plan -     Clinical Impression Statement Assessed pt on stairs and he was able to perform at supervision/CGA level with 1 rail. Should be able to get in/out of home with family member spotting for safety and take transportation to clinic versus nonemergent EMS. Berg Balance assessed and pt high fall risk with score of 16/56. Pt relies heavily on UE support for balance. Pt was voicing frustrations about appearance of leg during session.    Personal Factors and Comorbidities Comorbidity 3+;Time  since onset of injury/illness/exacerbation    Comorbidities see above    Examination-Activity Limitations Locomotion Level;Squat;Stairs;Stand;Transfers;Carry    Examination-Participation Restrictions Community Activity;Driving;Cleaning;Laundry;Meal Prep    Stability/Clinical Decision Making Evolving/Moderate complexity    Rehab Potential Good    PT Frequency 2x / week    PT Duration 12 weeks    PT Treatment/Interventions ADLs/Self Care Home Management;DME Instruction;Gait training;Stair training;Functional mobility training;Therapeutic activities;Therapeutic exercise;Balance training;Neuromuscular re-education;Patient/family education;Prosthetic Training;Passive range of motion;Energy conservation;Vestibular    PT Next Visit Plan Did pt increase wear time to 2 hours, 2x/day? How did stairs go? I advised he could do with family spotting for safety to take transportation now. continue prosthetic education, give sink HEP, balance training.    Consulted and Agree with Plan of Care Patient               Electa Sniff, PT, DPT, NCS 11/27/2021, 1:57 PM

## 2021-11-28 DIAGNOSIS — Z743 Need for continuous supervision: Secondary | ICD-10-CM | POA: Diagnosis not present

## 2021-11-28 DIAGNOSIS — N186 End stage renal disease: Secondary | ICD-10-CM | POA: Diagnosis not present

## 2021-11-28 DIAGNOSIS — I10 Essential (primary) hypertension: Secondary | ICD-10-CM | POA: Diagnosis not present

## 2021-11-28 DIAGNOSIS — D689 Coagulation defect, unspecified: Secondary | ICD-10-CM | POA: Diagnosis not present

## 2021-11-28 DIAGNOSIS — N2581 Secondary hyperparathyroidism of renal origin: Secondary | ICD-10-CM | POA: Diagnosis not present

## 2021-11-28 DIAGNOSIS — Z992 Dependence on renal dialysis: Secondary | ICD-10-CM | POA: Diagnosis not present

## 2021-11-28 DIAGNOSIS — R531 Weakness: Secondary | ICD-10-CM | POA: Diagnosis not present

## 2021-11-29 DIAGNOSIS — I10 Essential (primary) hypertension: Secondary | ICD-10-CM | POA: Diagnosis not present

## 2021-11-30 DIAGNOSIS — I10 Essential (primary) hypertension: Secondary | ICD-10-CM | POA: Diagnosis not present

## 2021-12-01 DIAGNOSIS — Z743 Need for continuous supervision: Secondary | ICD-10-CM | POA: Diagnosis not present

## 2021-12-01 DIAGNOSIS — Z992 Dependence on renal dialysis: Secondary | ICD-10-CM | POA: Diagnosis not present

## 2021-12-01 DIAGNOSIS — D689 Coagulation defect, unspecified: Secondary | ICD-10-CM | POA: Diagnosis not present

## 2021-12-01 DIAGNOSIS — E8770 Fluid overload, unspecified: Secondary | ICD-10-CM | POA: Diagnosis not present

## 2021-12-01 DIAGNOSIS — I10 Essential (primary) hypertension: Secondary | ICD-10-CM | POA: Diagnosis not present

## 2021-12-01 DIAGNOSIS — R531 Weakness: Secondary | ICD-10-CM | POA: Diagnosis not present

## 2021-12-01 DIAGNOSIS — N2581 Secondary hyperparathyroidism of renal origin: Secondary | ICD-10-CM | POA: Diagnosis not present

## 2021-12-01 DIAGNOSIS — N186 End stage renal disease: Secondary | ICD-10-CM | POA: Diagnosis not present

## 2021-12-01 DIAGNOSIS — T8249XA Other complication of vascular dialysis catheter, initial encounter: Secondary | ICD-10-CM | POA: Diagnosis not present

## 2021-12-02 ENCOUNTER — Ambulatory Visit: Payer: Medicare HMO

## 2021-12-02 ENCOUNTER — Other Ambulatory Visit: Payer: Self-pay

## 2021-12-02 DIAGNOSIS — I10 Essential (primary) hypertension: Secondary | ICD-10-CM | POA: Diagnosis not present

## 2021-12-02 DIAGNOSIS — M6281 Muscle weakness (generalized): Secondary | ICD-10-CM | POA: Diagnosis not present

## 2021-12-02 DIAGNOSIS — R2681 Unsteadiness on feet: Secondary | ICD-10-CM

## 2021-12-02 DIAGNOSIS — R296 Repeated falls: Secondary | ICD-10-CM | POA: Diagnosis not present

## 2021-12-02 DIAGNOSIS — M25652 Stiffness of left hip, not elsewhere classified: Secondary | ICD-10-CM | POA: Diagnosis not present

## 2021-12-02 DIAGNOSIS — R2689 Other abnormalities of gait and mobility: Secondary | ICD-10-CM

## 2021-12-02 NOTE — Therapy (Signed)
OUTPATIENT PHYSICAL THERAPY TREATMENT NOTE   Patient Name: [name] MRN: [id number] DOB:03/28/1981, 41 y.o., male Today's Date: 12/02/2021  PCP: Kerin Perna, NP REFERRING PROVIDER: Pearson Grippe   PT End of Session - 12/02/21 1159     Visit Number 3    Number of Visits 25    Date for PT Re-Evaluation 02/18/22    Authorization Type Aetna Medicare and Medicaid    Progress Note Due on Visit 10    PT Start Time 1155    PT Stop Time 9509    PT Time Calculation (min) 40 min    Equipment Utilized During Treatment Gait belt    Activity Tolerance Patient tolerated treatment well    Behavior During Therapy WFL for tasks assessed/performed             Past Medical History:  Diagnosis Date   Anemia    Cellulitis and abscess of toe of left foot    Diabetes mellitus without complication ([place])    Diabetic gastroparesis ([place])    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 Filetted toe flap metatarsal resection. Debridement Plantar Foot wound;  Surgeon: Evelina Bucy, DPM;  Location: [place];  Service: Podiatry;  Laterality: Right;   AMPUTATION Left 08/20/2018   Procedure: FIFTH METATARSAL BONE BIOPSY;  Surgeon: Evelina Bucy, DPM;  Location: [place];  Service: Podiatry;  Laterality: Left;   AMPUTATION Left 10/28/2018   Procedure: LEFT GREAT TOE AMPUTATION;  Surgeon: Evelina Bucy, DPM;  Location: [place];  Service: Podiatry;  Laterality: Left;   AMPUTATION Left 09/16/2020   Procedure: AMPUTATION BELOW KNEE;  Surgeon: Rosetta Posner, MD;  Location: [place];  Service: Vascular;  Laterality: Left;   AMPUTATION Left 10/15/2020   Procedure: AMPUTATION ABOVE KNEE;  Surgeon:  Elam Dutch, MD;  Location: [place];  Service: Vascular;  Laterality: Left;   APPLICATION OF WOUND VAC  02/02/2018   Procedure: APPLICATION OF WOUND VAC  Right Foot;  Surgeon: Evelina Bucy, DPM;  Location: [place];  Service: Podiatry;;   APPLICATION OF WOUND VAC Left 10/28/2018   Procedure: APPLICATION OF WOUND VAC LEFT TOE;  Surgeon: Evelina Bucy, DPM;  Location: [place];  Service: Podiatry;  Laterality: Left;   APPLICATION OF WOUND VAC Left 11/01/2018   Procedure: APPLICATION OF WOUND VAC;  Surgeon: Evelina Bucy, DPM;  Location: [place];  Service: Podiatry;  Laterality: Left;   AV FISTULA PLACEMENT     left arm.   AV FISTULA PLACEMENT Right 12/22/2016   Procedure: INSERTION OF ARTERIOVENOUS (AV) GORE-TEX GRAFT ARM;  Surgeon: Elam Dutch, MD;  Location: [place];  Service: Vascular;  Laterality: Right;   AV FISTULA PLACEMENT Left 05/26/2018   Procedure: INSERTION OF  ARTERIOVENOUS (AV) GORE-TEX GRAFT LEFT ARM;  Surgeon: Serafina Mitchell, MD;  Location: [place];  Service: Vascular;  Laterality: Left;   EYE SURGERY     I & D EXTREMITY Right 10/31/2017   Procedure: IRRIGATION AND DEBRIDEMENT RIGHT FOOT;  Surgeon: Evelina Bucy, DPM;  Location: [place];  Service: Podiatry;  Laterality: Right;   I & D EXTREMITY Left  08/20/2018   Procedure: IRRIGATION AND DEBRIDEMENT EXTREMITY WITH SECONDARY WOUND CLOSUREAND APPLICATION OF WOUND VAC LEFT FOOT;  Surgeon: Evelina Bucy, DPM;  Location: [place];  Service: Podiatry;  Laterality: Left;   I & D EXTREMITY Left 10/20/2018   Procedure: IRRIGATION AND DEBRIDEMENT LEFT FOOT  DEBRIDEMENT LATERAL FOOT WOUND;  Surgeon: Evelina Bucy, DPM;  Location: [place];  Service: Podiatry;  Laterality: Left;   I & D EXTREMITY Left 10/28/2018   Procedure: IRRIGATION AND DEBRIDEMENT LEFT TOE;  Surgeon: Evelina Bucy, DPM;  Location: [place];  Service: Podiatry;  Laterality: Left;   I & D EXTREMITY Left 09/14/2020   Procedure: IRRIGATION AND DEBRIDEMENT WRIST;  Surgeon:  Leanora Cover, MD;  Location: [place];  Service: Orthopedics;  Laterality: Left;   INSERTION OF DIALYSIS CATHETER     Right subclavian   IR AV DIALY SHUNT INTRO NEEDLE/INTRACATH INITIAL W/PTA/IMG RIGHT Right 02/05/2018   IR FLUORO GUIDE CV LINE RIGHT  01/31/2020   IR THROMBECTOMY AV FISTULA W/THROMBOLYSIS/PTA INC/SHUNT/IMG LEFT Left 08/24/2018   IR THROMBECTOMY AV FISTULA W/THROMBOLYSIS/PTA INC/SHUNT/IMG LEFT Left 01/06/2019   IR US GUIDE VASC ACCESS LEFT  08/24/2018   IR US GUIDE VASC ACCESS RIGHT  02/05/2018   IR US GUIDE VASC ACCESS RIGHT  01/31/2020   IRRIGATION AND DEBRIDEMENT FOOT Right 10/23/2018   Procedure: Irrigation and Debridement to tendon, Left Foot;  Surgeon: Evelina Bucy, DPM;  Location: [place];  Service: Podiatry;  Laterality: Right;   IRRIGATION AND DEBRIDEMENT FOOT Left 11/01/2018   Procedure: IRRIGATION AND DEBRIDEMENT PARTIAL WOUND CLOSURE LOCAL TISSUE TRANSFER AND FLAP ROTATION, LEFT FOOT;  Surgeon: Evelina Bucy, DPM;  Location: [place];  Service: Podiatry;  Laterality: Left;   TRANSMETATARSAL AMPUTATION N/A 08/18/2018   Procedure: IRRIGATION AND DEBRIDEMENT OF LEFT 5TH TOE AND TRANSMETATARSAL, WITH PARTICAL LEFT 5TH TOE AND METATARSAL AMPUTATION, BONE BIOPSY, WOUND VAC APPLICATION.;  Surgeon: Evelina Bucy, DPM;  Location: [place];  Service: Podiatry;  Laterality: N/A;   UPPER EXTREMITY VENOGRAPHY N/A 11/16/2016   Procedure: Upper Extremity Venography - Right Central;  Surgeon: Elam Dutch, MD;  Location: [place] CV LAB;  Service: Cardiovascular;  Laterality: N/A;   UPPER EXTREMITY VENOGRAPHY N/A 05/25/2018   Procedure: UPPER EXTREMITY VENOGRAPHY - Bilateral;  Surgeon: Marty Heck, MD;  Location: [place] [facility];  Service: Cardiovascular;  Laterality: N/A;   Patient Active Problem List   Diagnosis Date Noted   DKA (diabetic ketoacidosis) ([place]) 08/09/2021   Cellulitis of right leg 05/19/2021   Pressure injury of skin 10/15/2020   Adjustment disorder,  unspecified 09/21/2020   Malnutrition of moderate degree 09/13/2020   Charcot's joint of foot, left    Pneumonia due to COVID-19 virus 02/14/2020   Allergy, unspecified, initial encounter 07/17/2019   Diarrhea 04/24/2019   Encounter for orthopedic aftercare following surgical amputation    DM (diabetes mellitus), secondary, uncontrolled, with neurologic complications    Headache 09/23/2018   Anemia of chronic disease 08/18/2018   Unspecified protein-calorie malnutrition ([place]) 02/14/2018   Type 1 diabetes mellitus with complication, without long-term current use of insulin ([place]) 10/25/2017   Diabetic gastroparesis ([place]) 10/25/2017   Iron deficiency anemia, unspecified 10/28/2016   Other specified coagulation defects ([place]) 10/28/2016   Secondary hyperparathyroidism of renal origin ([place]) 10/28/2016   HTN (hypertension) 08/09/2014   ESRD on dialysis ([place]) 40/81/4481   Metabolic acidosis 85/63/1497    REFERRING DIAG: L AKA  THERAPY DIAG:  Other abnormalities of gait  and mobility  Unsteadiness on feet  PERTINENT HISTORY: DMI, HTN, ESRD on dialysis (MWF)   PRECAUTIONS: fall  SUBJECTIVE: Pt reports that he went out to dinner for daughter's birthday. He came via nonemergent transport again. Reports that he is moving around more.  PAIN:  Are you having pain? Yes NPRS scale: 8/10 but comes and goes Pain location: left residual limb Pain orientation:  PAIN TYPE: sharp, phantom pains Pain description: sharp  Aggravating factors: phantom pains Relieving factors: takes gabapentin    TODAY'S TREATMENT:   12/02/21  STAIRS:  Level of Assistance: SBA and CGA  Stair Negotiation Technique: Step to Pattern with Single Rail on Left Bilateral Rails  Number of Stairs: 8   Height of Stairs: 6  Comments: First bout with bilateral rails, 2nd 4 with left rail only. Verbal cues for technique.  GAIT: Gait pattern: step through pattern and decreased stance time- Left Distance walked:  115 Assistive device utilized: Walker - 2 wheeled, prosthesis Level of assistance: CGA Comments: Verbal cues to shift over prosthesis and keep trunk erect.  Prosthetics: Prosthetic care comments: Pt reports that he does have some trouble getting leg on first thing in morning. He is getting 4 hours in for sure on dialysis days as he does wear there. Reports skin has been doing well.  Donning prosthesis:  Doffing prosthesis:  Prosthetic wear tolerance: 4 hours/day breaking up in to 2 hour intervals, 7 days/week Prosthetic weight bearing tolerance:  Residual limb condition: Pt denies any skin issues.   At counter: side stepping along counter 8' x 4 with verbal cues for form.  11/27/21 BP=128/70 Pt performed stair 4 steps x 2 with left rail in step-to pattern with verbal cues for sequence CGA/SBA. Transfer sit to stand at walker with UE support supervision. Squat/pivot w/c to chair supervision. Ambulated 50' x 2 with RW CGA with cues for upright posture and to shift over prosthesis.     Prosthetics: Prosthetic care comments: PT listened to pt's concerns with how prosthesis looks with clothes as he has always really been in to fashion. Reports it depresses him on how it looks. PT discussed that he can talk with prosthetist about a cover that may help but leg will be bulkier and different than his normal leg. Pt denies any issues with sweating currently. PT also observed right foot and pt has history of 5th toe amputation. Discussed importance of daily skin checks and pt reporting that he does not do but aware he should. Blood sugar has been up and down. Discussed needing to keep <300 to participate with exercise as can actually raise sugar and contraindicator for exercise at that time. Educated to increase wear time to 2 hours, 2x/day. Donning prosthesis:  not assessed today due to time Doffing prosthesis:  not assessed today due to time  Prosthetic wear tolerance: 2.5 hours/day Residual limb  condition: Pt reports skin is intact.     PATIENT EDUCATION: Education details: Prosthetic education. Stair training. Verbally instructed to perform side stepping at counter as part of HEP. Person educated: Patient Education method: Explanation Education comprehension: verbalized understanding and needs further education   HOME EXERCISE PROGRAM:    PT Short Term Goals -      PT SHORT TERM GOAL #1   Title Pt will be IND with initial HEP in order to indicate improved functional mobility.  (Target Date: 12/20/21)    Time 4    Period Weeks    Status New    Target Date  12/20/21      PT SHORT TERM GOAL #2   Title Pt will perform BERG balance test and improve score by 4-6 points from baseline in order to indicate dec fall risk.    Baseline 11/27/21 16/56    Time 4    Period Weeks    Status New      PT SHORT TERM GOAL #3   Title Pt will tolerate wearing prosthesis >/=75% of awake hours without skin issues and demonstrate understanding of sock ply adjustment and donning prosthesis with no more than 25% cuing.    Time 4    Period Weeks    Status New      PT SHORT TERM GOAL #4   Title Pt will ambulate x 200' with RW at S level over indoor surfaces in order to indicate safety with ambulation in house.    Time 4    Period Weeks    Status New      PT SHORT TERM GOAL #5   Title Pt will negotiate up/down 4 stairs with single rail, up/down curb and ramp and over 200' of uneven outdoor paved surfaces with LRAD at S level in order to indicate safety with home and community entry/exit.    Time 4    Period Weeks    Status New      PT SHORT TERM GOAL #6   Title Pt will improve gait speed to >/=1.0 ft/sec with RW in order to indicate dec fall risk.    Time 4    Period Weeks    Status New              PT Long Term Goals -       PT LONG TERM GOAL #1   Title Pt will be IND with final HEP in order to indicate improved functional mobility and balance. (Target Date: 01/19/22, new  LTGs to be set for 02/18/22)    Time 8    Period Weeks    Status New    Target Date 01/19/22      PT LONG TERM GOAL #2   Title Pt will demonstrate 8-10 point improvement from baseline BERG score in order to demonstrate dec fall risk    Baseline 11/27/21 16/56    Time 8    Period Weeks    Status New      PT LONG TERM GOAL #3   Title Pt will ambulate x 500' over indoor and outdoor unlevel paved surfaces, negotiate ramps and curbs, with LRAD at mod I level in order to indicate improved endurance and ability to perform community tasks.    Time 8    Period Weeks    Status New      PT LONG TERM GOAL #4   Title Pt will improve gait speed to >/=2.0 ft/sec with RW in order to indicate dec fall risk.    Time 8    Period Weeks    Status New      PT LONG TERM GOAL #5   Title Pt will tolerate wearing prosthesis 90% of awake hours without skin issues, demonstrate ability to adjust socks appropriately and don/doff prosthesis without cuing.    Time 8    Period Weeks    Status New      PT LONG TERM GOAL #6   Title Pt will ambulate x 50' w/ quad tip cane vs single crutch at min A level in order to indicate improved independence with gait.  Time 8    Period Weeks    Status New              Plan -     Clinical Impression Statement Pt did well on stairs today with minor cuing with 1 rail. Instructed he does not need to take nonemergent transportation now. Able to increase gait distance with more upright posture and better weight shift.    Personal Factors and Comorbidities Comorbidity 3+;Time since onset of injury/illness/exacerbation    Comorbidities see above    Examination-Activity Limitations Locomotion Level;Squat;Stairs;Stand;Transfers;Carry    Examination-Participation Restrictions Community Activity;Driving;Cleaning;Laundry;Meal Prep    Stability/Clinical Decision Making Evolving/Moderate complexity    Rehab Potential Good    PT Frequency 2x / week    PT Duration 12 weeks     PT Treatment/Interventions ADLs/Self Care Home Management;DME Instruction;Gait training;Stair training;Functional mobility training;Therapeutic activities;Therapeutic exercise;Balance training;Neuromuscular re-education;Patient/family education;Prosthetic Training;Passive range of motion;Energy conservation;Vestibular    PT Next Visit Plan Check skin next visit and progress wear time as able. Currently 2 hours, 2x/day. I advised he could do with family spotting for safety to take transportation now. continue prosthetic education, give sink HEP (I verbally gave side stepping last visit), balance training.    Consulted and Agree with Plan of Care Patient               Electa Sniff, PT, DPT, NCS 12/02/2021, 1:17 PM

## 2021-12-03 DIAGNOSIS — Z743 Need for continuous supervision: Secondary | ICD-10-CM | POA: Diagnosis not present

## 2021-12-03 DIAGNOSIS — R531 Weakness: Secondary | ICD-10-CM | POA: Diagnosis not present

## 2021-12-03 DIAGNOSIS — N186 End stage renal disease: Secondary | ICD-10-CM | POA: Diagnosis not present

## 2021-12-03 DIAGNOSIS — E8770 Fluid overload, unspecified: Secondary | ICD-10-CM | POA: Diagnosis not present

## 2021-12-03 DIAGNOSIS — I10 Essential (primary) hypertension: Secondary | ICD-10-CM | POA: Diagnosis not present

## 2021-12-03 DIAGNOSIS — T8249XA Other complication of vascular dialysis catheter, initial encounter: Secondary | ICD-10-CM | POA: Diagnosis not present

## 2021-12-03 DIAGNOSIS — Z992 Dependence on renal dialysis: Secondary | ICD-10-CM | POA: Diagnosis not present

## 2021-12-03 DIAGNOSIS — D689 Coagulation defect, unspecified: Secondary | ICD-10-CM | POA: Diagnosis not present

## 2021-12-03 DIAGNOSIS — N2581 Secondary hyperparathyroidism of renal origin: Secondary | ICD-10-CM | POA: Diagnosis not present

## 2021-12-04 ENCOUNTER — Ambulatory Visit: Payer: Medicare HMO

## 2021-12-04 DIAGNOSIS — I10 Essential (primary) hypertension: Secondary | ICD-10-CM | POA: Diagnosis not present

## 2021-12-05 DIAGNOSIS — R531 Weakness: Secondary | ICD-10-CM | POA: Diagnosis not present

## 2021-12-05 DIAGNOSIS — T8249XA Other complication of vascular dialysis catheter, initial encounter: Secondary | ICD-10-CM | POA: Diagnosis not present

## 2021-12-05 DIAGNOSIS — I10 Essential (primary) hypertension: Secondary | ICD-10-CM | POA: Diagnosis not present

## 2021-12-05 DIAGNOSIS — Z743 Need for continuous supervision: Secondary | ICD-10-CM | POA: Diagnosis not present

## 2021-12-05 DIAGNOSIS — D689 Coagulation defect, unspecified: Secondary | ICD-10-CM | POA: Diagnosis not present

## 2021-12-05 DIAGNOSIS — N186 End stage renal disease: Secondary | ICD-10-CM | POA: Diagnosis not present

## 2021-12-05 DIAGNOSIS — E8770 Fluid overload, unspecified: Secondary | ICD-10-CM | POA: Diagnosis not present

## 2021-12-05 DIAGNOSIS — Z992 Dependence on renal dialysis: Secondary | ICD-10-CM | POA: Diagnosis not present

## 2021-12-05 DIAGNOSIS — N2581 Secondary hyperparathyroidism of renal origin: Secondary | ICD-10-CM | POA: Diagnosis not present

## 2021-12-06 DIAGNOSIS — R1084 Generalized abdominal pain: Secondary | ICD-10-CM | POA: Diagnosis not present

## 2021-12-06 DIAGNOSIS — Z743 Need for continuous supervision: Secondary | ICD-10-CM | POA: Diagnosis not present

## 2021-12-06 DIAGNOSIS — D689 Coagulation defect, unspecified: Secondary | ICD-10-CM | POA: Diagnosis not present

## 2021-12-06 DIAGNOSIS — Z992 Dependence on renal dialysis: Secondary | ICD-10-CM | POA: Diagnosis not present

## 2021-12-06 DIAGNOSIS — N2581 Secondary hyperparathyroidism of renal origin: Secondary | ICD-10-CM | POA: Diagnosis not present

## 2021-12-06 DIAGNOSIS — N186 End stage renal disease: Secondary | ICD-10-CM | POA: Diagnosis not present

## 2021-12-06 DIAGNOSIS — I10 Essential (primary) hypertension: Secondary | ICD-10-CM | POA: Diagnosis not present

## 2021-12-06 DIAGNOSIS — T8249XA Other complication of vascular dialysis catheter, initial encounter: Secondary | ICD-10-CM | POA: Diagnosis not present

## 2021-12-06 DIAGNOSIS — E8770 Fluid overload, unspecified: Secondary | ICD-10-CM | POA: Diagnosis not present

## 2021-12-07 DIAGNOSIS — I10 Essential (primary) hypertension: Secondary | ICD-10-CM | POA: Diagnosis not present

## 2021-12-08 DIAGNOSIS — D689 Coagulation defect, unspecified: Secondary | ICD-10-CM | POA: Diagnosis not present

## 2021-12-08 DIAGNOSIS — R339 Retention of urine, unspecified: Secondary | ICD-10-CM | POA: Diagnosis not present

## 2021-12-08 DIAGNOSIS — R531 Weakness: Secondary | ICD-10-CM | POA: Diagnosis not present

## 2021-12-08 DIAGNOSIS — Z992 Dependence on renal dialysis: Secondary | ICD-10-CM | POA: Diagnosis not present

## 2021-12-08 DIAGNOSIS — N2581 Secondary hyperparathyroidism of renal origin: Secondary | ICD-10-CM | POA: Diagnosis not present

## 2021-12-08 DIAGNOSIS — N186 End stage renal disease: Secondary | ICD-10-CM | POA: Diagnosis not present

## 2021-12-08 DIAGNOSIS — I10 Essential (primary) hypertension: Secondary | ICD-10-CM | POA: Diagnosis not present

## 2021-12-08 DIAGNOSIS — Z743 Need for continuous supervision: Secondary | ICD-10-CM | POA: Diagnosis not present

## 2021-12-09 ENCOUNTER — Other Ambulatory Visit: Payer: Self-pay

## 2021-12-09 ENCOUNTER — Ambulatory Visit: Payer: Medicare HMO

## 2021-12-09 ENCOUNTER — Telehealth: Payer: Self-pay

## 2021-12-09 DIAGNOSIS — I10 Essential (primary) hypertension: Secondary | ICD-10-CM | POA: Diagnosis not present

## 2021-12-09 DIAGNOSIS — M6281 Muscle weakness (generalized): Secondary | ICD-10-CM

## 2021-12-09 DIAGNOSIS — R296 Repeated falls: Secondary | ICD-10-CM | POA: Diagnosis not present

## 2021-12-09 DIAGNOSIS — M25652 Stiffness of left hip, not elsewhere classified: Secondary | ICD-10-CM | POA: Diagnosis not present

## 2021-12-09 DIAGNOSIS — R2689 Other abnormalities of gait and mobility: Secondary | ICD-10-CM | POA: Diagnosis not present

## 2021-12-09 DIAGNOSIS — R2681 Unsteadiness on feet: Secondary | ICD-10-CM | POA: Diagnosis not present

## 2021-12-09 NOTE — Telephone Encounter (Signed)
PT called [facility] and spoke with Ronalee Belts about pt's concerns with appearance of leg. He has been waiting on cover until PT almost done in case he has to make adjustments. States he will surely help with a cover when time. Asked PT to have pt call him to discuss. ?Cherly Anderson, PT, DPT, NCS ? ?

## 2021-12-09 NOTE — Therapy (Signed)
?OUTPATIENT PHYSICAL THERAPY TREATMENT NOTE ? ? ?Patient Name: [name] ?MRN: [id number] ?DOB:05-22-1981, 41 y.o., male ?Today's Date: 12/09/2021 ? ?PCP: Kerin Perna, NP ?REFERRING PROVIDER: Pearson Grippe ? ? PT End of Session - 12/09/21 0936   ? ? Visit Number 4   ? Number of Visits 25   ? Date for PT Re-Evaluation 02/18/22   ? Authorization Type Aetna Medicare and Medicaid   ? Progress Note Due on Visit 10   ? PT Start Time [phone]   ? PT Stop Time 1015   ? PT Time Calculation (min) 41 min   ? Equipment Utilized During Treatment Gait belt   ? Activity Tolerance Patient tolerated treatment well   ? Behavior During Therapy [facility] for tasks assessed/performed   ? ?  ?  ? ?  ? ? ?Past Medical History:  ?Diagnosis Date  ? Anemia   ? Cellulitis and abscess of toe of left foot   ? Diabetes mellitus without complication ([place])   ? Diabetic gastroparesis ([place])   ? Diabetic ulcer of left midfoot associated with diabetes mellitus due to underlying condition, with necrosis of bone ([place])   ? Dialysis patient [facility])   ? Hypertension   ? Renal disorder   ? Dialysis  ? Sepsis ([place])   ? ?Past Surgical History:  ?Procedure Laterality Date  ? AMPUTATION Right 02/02/2018  ? Procedure: RIGHT FIFTH TOE AND METATARSAL AMPUTATION. Filetted toe flap metatarsal resection. Debridement Plantar Foot wound;  Surgeon: Evelina Bucy, DPM;  Location: [place];  Service: Podiatry;  Laterality: Right;  ? AMPUTATION Left 08/20/2018  ? Procedure: FIFTH METATARSAL BONE BIOPSY;  Surgeon: Evelina Bucy, DPM;  Location: [place];  Service: Podiatry;  Laterality: Left;  ? AMPUTATION Left 10/28/2018  ? Procedure: LEFT GREAT TOE AMPUTATION;  Surgeon: Evelina Bucy, DPM;  Location: [place];  Service: Podiatry;  Laterality: Left;  ? AMPUTATION Left 09/16/2020  ? Procedure: AMPUTATION BELOW KNEE;  Surgeon: Rosetta Posner, MD;  Location: [facility] OR;  Service: Vascular;  Laterality: Left;  ? AMPUTATION Left 10/15/2020  ? Procedure: AMPUTATION ABOVE KNEE;  Surgeon:  Elam Dutch, MD;  Location: [place];  Service: Vascular;  Laterality: Left;  ? APPLICATION OF WOUND VAC  02/02/2018  ? Procedure: APPLICATION OF WOUND VAC  Right Foot;  Surgeon: Evelina Bucy, DPM;  Location: [place];  Service: Podiatry;;  ? APPLICATION OF WOUND VAC Left 10/28/2018  ? Procedure: APPLICATION OF WOUND VAC LEFT TOE;  Surgeon: Evelina Bucy, DPM;  Location: [place];  Service: Podiatry;  Laterality: Left;  ? APPLICATION OF WOUND VAC Left 11/01/2018  ? Procedure: APPLICATION OF WOUND VAC;  Surgeon: Evelina Bucy, DPM;  Location: [place];  Service: Podiatry;  Laterality: Left;  ? AV FISTULA PLACEMENT    ? left arm.  ? AV FISTULA PLACEMENT Right 12/22/2016  ? Procedure: INSERTION OF ARTERIOVENOUS (AV) GORE-TEX GRAFT ARM;  Surgeon: Elam Dutch, MD;  Location: [facility] OR;  Service: Vascular;  Laterality: Right;  ? AV FISTULA PLACEMENT Left 05/26/2018  ? Procedure: INSERTION OF  ARTERIOVENOUS (AV) GORE-TEX GRAFT LEFT ARM;  Surgeon: Serafina Mitchell, MD;  Location: [place];  Service: Vascular;  Laterality: Left;  ? EYE SURGERY    ? I & D EXTREMITY Right 10/31/2017  ? Procedure: IRRIGATION AND DEBRIDEMENT RIGHT FOOT;  Surgeon: Evelina Bucy, DPM;  Location: [place];  Service: Podiatry;  Laterality: Right;  ? I & D EXTREMITY Left  08/20/2018  ? Procedure: IRRIGATION AND DEBRIDEMENT EXTREMITY WITH SECONDARY WOUND CLOSUREAND APPLICATION OF WOUND VAC LEFT FOOT;  Surgeon: Evelina Bucy, DPM;  Location: [place];  Service: Podiatry;  Laterality: Left;  ? I & D EXTREMITY Left 10/20/2018  ? Procedure: IRRIGATION AND DEBRIDEMENT LEFT FOOT  DEBRIDEMENT LATERAL FOOT WOUND;  Surgeon: Evelina Bucy, DPM;  Location: [place];  Service: Podiatry;  Laterality: Left;  ? I & D EXTREMITY Left 10/28/2018  ? Procedure: IRRIGATION AND DEBRIDEMENT LEFT TOE;  Surgeon: Evelina Bucy, DPM;  Location: [place];  Service: Podiatry;  Laterality: Left;  ? I & D EXTREMITY Left 09/14/2020  ? Procedure: IRRIGATION AND DEBRIDEMENT WRIST;  Surgeon:  Leanora Cover, MD;  Location: [place];  Service: Orthopedics;  Laterality: Left;  ? INSERTION OF DIALYSIS CATHETER    ? Right subclavian  ? IR AV DIALY SHUNT INTRO NEEDLE/INTRACATH INITIAL W/PTA/IMG RIGHT Right 02/05/2018  ? IR FLUORO GUIDE CV LINE RIGHT  01/31/2020  ? IR THROMBECTOMY AV FISTULA W/THROMBOLYSIS/PTA INC/SHUNT/IMG LEFT Left 08/24/2018  ? IR THROMBECTOMY AV FISTULA W/THROMBOLYSIS/PTA INC/SHUNT/IMG LEFT Left 01/06/2019  ? IR US GUIDE VASC ACCESS LEFT  08/24/2018  ? IR US GUIDE VASC ACCESS RIGHT  02/05/2018  ? IR US GUIDE VASC ACCESS RIGHT  01/31/2020  ? IRRIGATION AND DEBRIDEMENT FOOT Right 10/23/2018  ? Procedure: Irrigation and Debridement to tendon, Left Foot;  Surgeon: Evelina Bucy, DPM;  Location: [place];  Service: Podiatry;  Laterality: Right;  ? IRRIGATION AND DEBRIDEMENT FOOT Left 11/01/2018  ? Procedure: IRRIGATION AND DEBRIDEMENT PARTIAL WOUND CLOSURE LOCAL TISSUE TRANSFER AND FLAP ROTATION, LEFT FOOT;  Surgeon: Evelina Bucy, DPM;  Location: [place];  Service: Podiatry;  Laterality: Left;  ? TRANSMETATARSAL AMPUTATION N/A 08/18/2018  ? Procedure: IRRIGATION AND DEBRIDEMENT OF LEFT 5TH TOE AND TRANSMETATARSAL, WITH PARTICAL LEFT 5TH TOE AND METATARSAL AMPUTATION, BONE BIOPSY, WOUND VAC APPLICATION.;  Surgeon: Evelina Bucy, DPM;  Location: [place];  Service: Podiatry;  Laterality: N/A;  ? UPPER EXTREMITY VENOGRAPHY N/A 11/16/2016  ? Procedure: Upper Extremity Venography - Right Central;  Surgeon: Elam Dutch, MD;  Location: [place] CV LAB;  Service: Cardiovascular;  Laterality: N/A;  ? UPPER EXTREMITY VENOGRAPHY N/A 05/25/2018  ? Procedure: UPPER EXTREMITY VENOGRAPHY - Bilateral;  Surgeon: Marty Heck, MD;  Location: [place] [facility];  Service: Cardiovascular;  Laterality: N/A;  ? ?Patient Active Problem List  ? Diagnosis Date Noted  ? DKA (diabetic ketoacidosis) ([place]) 08/09/2021  ? Cellulitis of right leg 05/19/2021  ? Pressure injury of skin 10/15/2020  ? Adjustment disorder,  unspecified 09/21/2020  ? Malnutrition of moderate degree 09/13/2020  ? Charcot's joint of foot, left   ? Pneumonia due to COVID-19 virus 02/14/2020  ? Allergy, unspecified, initial encounter 07/17/2019  ? Diarrhea 04/24/2019  ? Encounter for orthopedic aftercare following surgical amputation   ? DM (diabetes mellitus), secondary, uncontrolled, with neurologic complications   ? Headache 09/23/2018  ? Anemia of chronic disease 08/18/2018  ? Unspecified protein-calorie malnutrition ([place]) 02/14/2018  ? Type 1 diabetes mellitus with complication, without long-term current use of insulin ([place]) 10/25/2017  ? Diabetic gastroparesis ([place]) 10/25/2017  ? Iron deficiency anemia, unspecified 10/28/2016  ? 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 Secondary hyperparathyroidism of renal origin ([place]) 10/28/2016  ? HTN (hypertension) 08/09/2014  ? ESRD on dialysis [facility]) 03/19/2014  ? Metabolic acidosis 00/93/8182  ? ? ?REFERRING DIAG: L AKA ? ?THERAPY DIAG:  ?Other abnormalities of gait  and mobility ? ?Unsteadiness on feet ? ?Muscle weakness (generalized) ? ?PERTINENT HISTORY: DMI, HTN, ESRD on dialysis (MWF)  ? ?PRECAUTIONS: fall ? ?SUBJECTIVE: Pt reports that he has been doing the stairs some. Still came with EMS transport. Reports he has not had time to set up other transport and does not like to have to wait. PT encouraged to set up other transport now. Can also look at driving at future option. Pt still unhappy with appearance of prosthesis hurting his motivation to wear it. ? ?PAIN:  ?Are you having pain? no ? ? ? ?TODAY'S TREATMENT:  ?12/09/21: ? ?GAIT: ?Gait pattern: step through pattern and decreased stance time- Left ?Distance walked: 115 ?Assistive device utilized: Environmental consultant - 2 wheeled,prosthesis ?Level of assistance: SBA and CGA ?Comments: Verbal and tactile cues to weight shift over prosthesis and stand tall. ?Prosthetics: ?Prosthetic care comments: Pt was instructed to increase wear time to 3 hours/2x/day  for 6 hours total. Pt to continue to closely monitor skin. Pt voicing concerns again with appearance of leg. PT explained that a cover is something prosthetist can help with. PT will call to check on process with

## 2021-12-10 DIAGNOSIS — Z992 Dependence on renal dialysis: Secondary | ICD-10-CM | POA: Diagnosis not present

## 2021-12-10 DIAGNOSIS — D689 Coagulation defect, unspecified: Secondary | ICD-10-CM | POA: Diagnosis not present

## 2021-12-10 DIAGNOSIS — Z743 Need for continuous supervision: Secondary | ICD-10-CM | POA: Diagnosis not present

## 2021-12-10 DIAGNOSIS — N186 End stage renal disease: Secondary | ICD-10-CM | POA: Diagnosis not present

## 2021-12-10 DIAGNOSIS — I10 Essential (primary) hypertension: Secondary | ICD-10-CM | POA: Diagnosis not present

## 2021-12-10 DIAGNOSIS — R531 Weakness: Secondary | ICD-10-CM | POA: Diagnosis not present

## 2021-12-10 DIAGNOSIS — N2581 Secondary hyperparathyroidism of renal origin: Secondary | ICD-10-CM | POA: Diagnosis not present

## 2021-12-11 ENCOUNTER — Other Ambulatory Visit: Payer: Self-pay

## 2021-12-11 ENCOUNTER — Encounter: Payer: Self-pay | Admitting: Physical Therapy

## 2021-12-11 ENCOUNTER — Ambulatory Visit: Payer: Medicare HMO | Admitting: Physical Therapy

## 2021-12-11 DIAGNOSIS — M6281 Muscle weakness (generalized): Secondary | ICD-10-CM | POA: Diagnosis not present

## 2021-12-11 DIAGNOSIS — R2681 Unsteadiness on feet: Secondary | ICD-10-CM | POA: Diagnosis not present

## 2021-12-11 DIAGNOSIS — M25652 Stiffness of left hip, not elsewhere classified: Secondary | ICD-10-CM | POA: Diagnosis not present

## 2021-12-11 DIAGNOSIS — I10 Essential (primary) hypertension: Secondary | ICD-10-CM | POA: Diagnosis not present

## 2021-12-11 DIAGNOSIS — R296 Repeated falls: Secondary | ICD-10-CM | POA: Diagnosis not present

## 2021-12-11 DIAGNOSIS — R2689 Other abnormalities of gait and mobility: Secondary | ICD-10-CM | POA: Diagnosis not present

## 2021-12-11 NOTE — Therapy (Signed)
?OUTPATIENT PHYSICAL THERAPY TREATMENT NOTE ? ? ?Patient Name: [name] ?MRN: [id number] ?DOB:1981-05-13, 41 y.o., male ?Today's Date: 12/11/2021 ? ?PCP: Kerin Perna, NP ?REFERRING PROVIDER: Pearson Grippe ? ? PT End of Session - 12/11/21 0940   ? ? Visit Number 5   ? Number of Visits 25   ? Date for PT Re-Evaluation 02/18/22   ? Authorization Type Aetna Medicare and Medicaid   ? Progress Note Due on Visit 10   ? PT Start Time [phone]   ? PT Stop Time 1015   ? PT Time Calculation (min) 41 min   ? Equipment Utilized During Treatment Gait belt   ? Activity Tolerance Patient tolerated treatment well   ? Behavior During Therapy [facility] for tasks assessed/performed   ? ?  ?  ? ?  ? ? ?Past Medical History:  ?Diagnosis Date  ? Anemia   ? Cellulitis and abscess of toe of left foot   ? Diabetes mellitus without complication ([place])   ? Diabetic gastroparesis ([place])   ? 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 Dialysis patient [facility])   ? Hypertension   ? Renal disorder   ? Dialysis  ? Sepsis ([place])   ? ?Past Surgical History:  ?Procedure Laterality Date  ? AMPUTATION Right 02/02/2018  ? Procedure: RIGHT FIFTH TOE AND METATARSAL AMPUTATION. Filetted toe flap metatarsal resection. Debridement Plantar Foot wound;  Surgeon: Evelina Bucy, DPM;  Location: [place];  Service: Podiatry;  Laterality: Right;  ? AMPUTATION Left 08/20/2018  ? Procedure: FIFTH METATARSAL BONE BIOPSY;  Surgeon: Evelina Bucy, DPM;  Location: [place];  Service: Podiatry;  Laterality: Left;  ? AMPUTATION Left 10/28/2018  ? Procedure: LEFT GREAT TOE AMPUTATION;  Surgeon: Evelina Bucy, DPM;  Location: [place];  Service: Podiatry;  Laterality: Left;  ? AMPUTATION Left 09/16/2020  ? Procedure: AMPUTATION BELOW KNEE;  Surgeon: Rosetta Posner, MD;  Location: [facility] OR;  Service: Vascular;  Laterality: Left;  ? AMPUTATION Left 10/15/2020  ? Procedure: AMPUTATION ABOVE KNEE;  Surgeon:  Elam Dutch, MD;  Location: [place];  Service: Vascular;  Laterality: Left;  ? APPLICATION OF WOUND VAC  02/02/2018  ? Procedure: APPLICATION OF WOUND VAC  Right Foot;  Surgeon: Evelina Bucy, DPM;  Location: [place];  Service: Podiatry;;  ? APPLICATION OF WOUND VAC Left 10/28/2018  ? Procedure: APPLICATION OF WOUND VAC LEFT TOE;  Surgeon: Evelina Bucy, DPM;  Location: [place];  Service: Podiatry;  Laterality: Left;  ? APPLICATION OF WOUND VAC Left 11/01/2018  ? Procedure: APPLICATION OF WOUND VAC;  Surgeon: Evelina Bucy, DPM;  Location: [place];  Service: Podiatry;  Laterality: Left;  ? AV FISTULA PLACEMENT    ? left arm.  ? AV FISTULA PLACEMENT Right 12/22/2016  ? Procedure: INSERTION OF ARTERIOVENOUS (AV) GORE-TEX GRAFT ARM;  Surgeon: Elam Dutch, MD;  Location: [facility] OR;  Service: Vascular;  Laterality: Right;  ? AV FISTULA PLACEMENT Left 05/26/2018  ? Procedure: INSERTION OF  ARTERIOVENOUS (AV) GORE-TEX GRAFT LEFT ARM;  Surgeon: Serafina Mitchell, MD;  Location: [place];  Service: Vascular;  Laterality: Left;  ? EYE SURGERY    ? I & D EXTREMITY Right 10/31/2017  ? Procedure: IRRIGATION AND DEBRIDEMENT RIGHT FOOT;  Surgeon: Evelina Bucy, DPM;  Location: [place];  Service: Podiatry;  Laterality: Right;  ? I & D EXTREMITY Left  08/20/2018  ? Procedure: IRRIGATION AND DEBRIDEMENT EXTREMITY WITH SECONDARY WOUND CLOSUREAND APPLICATION OF WOUND VAC LEFT FOOT;  Surgeon: Evelina Bucy, DPM;  Location: [place];  Service: Podiatry;  Laterality: Left;  ? I & D EXTREMITY Left 10/20/2018  ? Procedure: IRRIGATION AND DEBRIDEMENT LEFT FOOT  DEBRIDEMENT LATERAL FOOT WOUND;  Surgeon: Evelina Bucy, DPM;  Location: [place];  Service: Podiatry;  Laterality: Left;  ? I & D EXTREMITY Left 10/28/2018  ? Procedure: IRRIGATION AND DEBRIDEMENT LEFT TOE;  Surgeon: Evelina Bucy, DPM;  Location: [place];  Service: Podiatry;  Laterality: Left;  ? I & D EXTREMITY Left 09/14/2020  ? Procedure: IRRIGATION AND DEBRIDEMENT WRIST;  Surgeon:  Leanora Cover, MD;  Location: [place];  Service: Orthopedics;  Laterality: Left;  ? INSERTION OF DIALYSIS CATHETER    ? Right subclavian  ? IR AV DIALY SHUNT INTRO NEEDLE/INTRACATH INITIAL W/PTA/IMG RIGHT Right 02/05/2018  ? IR FLUORO GUIDE CV LINE RIGHT  01/31/2020  ? IR THROMBECTOMY AV FISTULA W/THROMBOLYSIS/PTA INC/SHUNT/IMG LEFT Left 08/24/2018  ? IR THROMBECTOMY AV FISTULA W/THROMBOLYSIS/PTA INC/SHUNT/IMG LEFT Left 01/06/2019  ? IR US GUIDE VASC ACCESS LEFT  08/24/2018  ? IR US GUIDE VASC ACCESS RIGHT  02/05/2018  ? IR US GUIDE VASC ACCESS RIGHT  01/31/2020  ? IRRIGATION AND DEBRIDEMENT FOOT Right 10/23/2018  ? Procedure: Irrigation and Debridement to tendon, Left Foot;  Surgeon: Evelina Bucy, DPM;  Location: [place];  Service: Podiatry;  Laterality: Right;  ? IRRIGATION AND DEBRIDEMENT FOOT Left 11/01/2018  ? Procedure: IRRIGATION AND DEBRIDEMENT PARTIAL WOUND CLOSURE LOCAL TISSUE TRANSFER AND FLAP ROTATION, LEFT FOOT;  Surgeon: Evelina Bucy, DPM;  Location: [place];  Service: Podiatry;  Laterality: Left;  ? TRANSMETATARSAL AMPUTATION N/A 08/18/2018  ? Procedure: IRRIGATION AND DEBRIDEMENT OF LEFT 5TH TOE AND TRANSMETATARSAL, WITH PARTICAL LEFT 5TH TOE AND METATARSAL AMPUTATION, BONE BIOPSY, WOUND VAC APPLICATION.;  Surgeon: Evelina Bucy, DPM;  Location: [place];  Service: Podiatry;  Laterality: N/A;  ? UPPER EXTREMITY VENOGRAPHY N/A 11/16/2016  ? Procedure: Upper Extremity Venography - Right Central;  Surgeon: Elam Dutch, MD;  Location: [place] CV LAB;  Service: Cardiovascular;  Laterality: N/A;  ? UPPER EXTREMITY VENOGRAPHY N/A 05/25/2018  ? Procedure: UPPER EXTREMITY VENOGRAPHY - Bilateral;  Surgeon: Marty Heck, MD;  Location: [place] CV LAB;  Service: Cardiovascular;  Laterality: N/A;  ? ?Patient Active Problem List  ? Diagnosis Date Noted  ? DKA (diabetic ketoacidosis) ([place]) 08/09/2021  ? Cellulitis of right leg 05/19/2021  ? Pressure injury of skin 10/15/2020  ? Adjustment disorder,  unspecified 09/21/2020  ? Malnutrition of moderate degree 09/13/2020  ? Charcot's joint of foot, left   ? Pneumonia due to COVID-19 virus 02/14/2020  ? Allergy, unspecified, initial encounter 07/17/2019  ? Diarrhea 04/24/2019  ? Encounter for orthopedic aftercare following surgical amputation   ? DM (diabetes mellitus), secondary, uncontrolled, with neurologic complications   ? Headache 09/23/2018  ? Anemia of chronic disease 08/18/2018  ? Unspecified protein-calorie malnutrition ([place]) 02/14/2018  ? Type 1 diabetes mellitus with complication, without long-term current use of insulin ([place]) 10/25/2017  ? Diabetic gastroparesis ([place]) 10/25/2017  ? Iron deficiency anemia, unspecified 10/28/2016  ? 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 Secondary hyperparathyroidism of renal origin ([place]) 10/28/2016  ? HTN (hypertension) 08/09/2014  ? ESRD on dialysis [facility]) 03/19/2014  ? Metabolic acidosis 81/19/1478  ? ? ?REFERRING DIAG: L AKA ? ?THERAPY DIAG:  ?Other abnormalities of gait  and mobility ? ?Unsteadiness on feet ? ?Muscle weakness (generalized) ? ?Stiffness of left hip, not elsewhere classified ? ?Repeated falls ? ?PERTINENT HISTORY: DMI, HTN, ESRD on dialysis (MWF)  ? ?PRECAUTIONS: fall ? ?SUBJECTIVE: No new complaints. Still working on stairs at home with family at times. No falls or pain to report.  ? ?PAIN:  ?Are you having pain? no ? ? ? ?TODAY'S TREATMENT:  ? 12/11/2021: ?CURRENT PROSTHETIC WEAR ASSESSMENT: ?Donning prosthesis:  supervision ?Doffing prosthesis: Modified independence ?Prosthetic wear tolerance: 4 hours/day, daily days/week (in 2 hour intervals on nondialysis days and 4 hours straight on dialysis days.) ?Residual limb condition: intact per pt report. Unable to assess due to clothing ?Comments: educated patient on sock ply management and how colors correspond to different thickness/plys. Pt able to verbalize understanding however will need reinforcement of this. Also discussed and  provided education on sweat management and signs of sweating under liner. Pt able to verbalize understanding of this.    ? ? ? ?GAIT: ?Gait pattern: step through pattern, decreased step length- Right, decreased stance t

## 2021-12-11 NOTE — Patient Instructions (Signed)
Do each exercise 2  times per day ?Do each exercise 10 repetitions ?Hold each exercise for 3-5 seconds to feel your location ? ?AT [place] AND PLACE FEET EQUAL DISTANCE FROM THE MIDLINE. ? ?USE TAPE ON FLOOR TO MARK THE MIDLINE POSITION. ?You also should try to feel with your limb pressure in socket.  You are trying to feel with limb what you used to feel with the bottom of your foot. ? ?Side to Side Shift: Moving your hips only (not shoulders): move weight onto your left leg, HOLD/FEEL.  Move back to equal weight on each leg, HOLD/FEEL. Move weight onto your right leg, HOLD/FEEL. Move back to equal weight on each leg, HOLD/FEEL. Repeat. ?Front to Back Shift: Moving your hips only (not shoulders): move your weight forward onto your toes, HOLD/FEEL. Move your weight back to equal Flat Foot on both legs, HOLD/FEEL. Move your weight back onto your heels, HOLD/FEEL. Move your weight back to equal on both legs, HOLD/FEEL. Repeat. ?Moving Cones / Cups: With equal weight on each leg: Hold on with one hand the first time, then progress to no hand supports. Move cups from one side of sink to the other. Place cups ~2? out of your reach, progress to 10? beyond reach. ?Overhead/Upward Reaching: alternated reaching up to top cabinets or ceiling if no cabinets present. Keep equal weight on each leg. Start with one hand support on counter while other hand reaches and progress to no hand support with reaching. ?5.   Looking Over Shoulders: With equal weight on each leg: alternate turning to look over your shoulders with one hand support on counter as needed. Shift weight to side    looking, pull hip then shoulder then head/eyes around to look behind you. Start  with one hand support & progress to no hand support. ?

## 2021-12-12 DIAGNOSIS — Z743 Need for continuous supervision: Secondary | ICD-10-CM | POA: Diagnosis not present

## 2021-12-12 DIAGNOSIS — N2581 Secondary hyperparathyroidism of renal origin: Secondary | ICD-10-CM | POA: Diagnosis not present

## 2021-12-12 DIAGNOSIS — D689 Coagulation defect, unspecified: Secondary | ICD-10-CM | POA: Diagnosis not present

## 2021-12-12 DIAGNOSIS — R531 Weakness: Secondary | ICD-10-CM | POA: Diagnosis not present

## 2021-12-12 DIAGNOSIS — N186 End stage renal disease: Secondary | ICD-10-CM | POA: Diagnosis not present

## 2021-12-12 DIAGNOSIS — I10 Essential (primary) hypertension: Secondary | ICD-10-CM | POA: Diagnosis not present

## 2021-12-12 DIAGNOSIS — Z992 Dependence on renal dialysis: Secondary | ICD-10-CM | POA: Diagnosis not present

## 2021-12-13 DIAGNOSIS — I10 Essential (primary) hypertension: Secondary | ICD-10-CM | POA: Diagnosis not present

## 2021-12-14 DIAGNOSIS — I10 Essential (primary) hypertension: Secondary | ICD-10-CM | POA: Diagnosis not present

## 2021-12-15 DIAGNOSIS — D689 Coagulation defect, unspecified: Secondary | ICD-10-CM | POA: Diagnosis not present

## 2021-12-15 DIAGNOSIS — Z743 Need for continuous supervision: Secondary | ICD-10-CM | POA: Diagnosis not present

## 2021-12-15 DIAGNOSIS — N186 End stage renal disease: Secondary | ICD-10-CM | POA: Diagnosis not present

## 2021-12-15 DIAGNOSIS — Z992 Dependence on renal dialysis: Secondary | ICD-10-CM | POA: Diagnosis not present

## 2021-12-15 DIAGNOSIS — I10 Essential (primary) hypertension: Secondary | ICD-10-CM | POA: Diagnosis not present

## 2021-12-15 DIAGNOSIS — R531 Weakness: Secondary | ICD-10-CM | POA: Diagnosis not present

## 2021-12-15 DIAGNOSIS — N2581 Secondary hyperparathyroidism of renal origin: Secondary | ICD-10-CM | POA: Diagnosis not present

## 2021-12-16 ENCOUNTER — Other Ambulatory Visit (INDEPENDENT_AMBULATORY_CARE_PROVIDER_SITE_OTHER): Payer: Self-pay | Admitting: Primary Care

## 2021-12-16 ENCOUNTER — Ambulatory Visit: Payer: Medicare HMO | Admitting: Physical Therapy

## 2021-12-16 DIAGNOSIS — I10 Essential (primary) hypertension: Secondary | ICD-10-CM | POA: Diagnosis not present

## 2021-12-17 DIAGNOSIS — N186 End stage renal disease: Secondary | ICD-10-CM | POA: Diagnosis not present

## 2021-12-17 DIAGNOSIS — Z992 Dependence on renal dialysis: Secondary | ICD-10-CM | POA: Diagnosis not present

## 2021-12-17 DIAGNOSIS — N2581 Secondary hyperparathyroidism of renal origin: Secondary | ICD-10-CM | POA: Diagnosis not present

## 2021-12-17 DIAGNOSIS — I10 Essential (primary) hypertension: Secondary | ICD-10-CM | POA: Diagnosis not present

## 2021-12-17 DIAGNOSIS — Z743 Need for continuous supervision: Secondary | ICD-10-CM | POA: Diagnosis not present

## 2021-12-17 DIAGNOSIS — R531 Weakness: Secondary | ICD-10-CM | POA: Diagnosis not present

## 2021-12-17 DIAGNOSIS — D689 Coagulation defect, unspecified: Secondary | ICD-10-CM | POA: Diagnosis not present

## 2021-12-18 ENCOUNTER — Other Ambulatory Visit: Payer: Self-pay

## 2021-12-18 ENCOUNTER — Other Ambulatory Visit (INDEPENDENT_AMBULATORY_CARE_PROVIDER_SITE_OTHER): Payer: Self-pay | Admitting: Primary Care

## 2021-12-18 ENCOUNTER — Ambulatory Visit: Payer: Medicare HMO

## 2021-12-18 DIAGNOSIS — R296 Repeated falls: Secondary | ICD-10-CM | POA: Diagnosis not present

## 2021-12-18 DIAGNOSIS — R2681 Unsteadiness on feet: Secondary | ICD-10-CM

## 2021-12-18 DIAGNOSIS — M25652 Stiffness of left hip, not elsewhere classified: Secondary | ICD-10-CM | POA: Diagnosis not present

## 2021-12-18 DIAGNOSIS — M6281 Muscle weakness (generalized): Secondary | ICD-10-CM | POA: Diagnosis not present

## 2021-12-18 DIAGNOSIS — R2689 Other abnormalities of gait and mobility: Secondary | ICD-10-CM

## 2021-12-18 DIAGNOSIS — I10 Essential (primary) hypertension: Secondary | ICD-10-CM | POA: Diagnosis not present

## 2021-12-18 NOTE — Telephone Encounter (Signed)
Pt called in demanding this Rx be refilled ASAP, please advise.  ?

## 2021-12-18 NOTE — Telephone Encounter (Signed)
Routed to PCP 

## 2021-12-18 NOTE — Therapy (Signed)
?OUTPATIENT PHYSICAL THERAPY TREATMENT NOTE ? ? ?Patient Name: [name] ?MRN: [id number] ?DOB:April 20, 1981, 41 y.o., male ?Today's Date: 12/18/2021 ? ?PCP: Kerin Perna, NP ?REFERRING PROVIDER: Pearson Grippe ? ? PT End of Session - 12/18/21 0865   ? ? Visit Number 6   ? Number of Visits 25   ? Date for PT Re-Evaluation 02/18/22   ? Authorization Type Aetna Medicare and Medicaid   ? Progress Note Due on Visit 10   ? PT Start Time [phone]   ? PT Stop Time 9629   ? PT Time Calculation (min) 45 min   ? Equipment Utilized During Treatment Gait belt   ? Activity Tolerance Patient tolerated treatment well   ? Behavior During Therapy [facility] for tasks assessed/performed   ? ?  ?  ? ?  ? ? ?Past Medical History:  ?Diagnosis Date  ? Anemia   ? Cellulitis and abscess of toe of left foot   ? Diabetes mellitus without complication ([place])   ? Diabetic gastroparesis ([place])   ? Diabetic ulcer of left midfoot associated with diabetes mellitus due to underlying condition, with necrosis of bone ([place])   ? Dialysis patient [facility])   ? Hypertension   ? Renal disorder   ? Dialysis  ? Sepsis ([place])   ? ?Past Surgical History:  ?Procedure Laterality Date  ? AMPUTATION Right 02/02/2018  ? Procedure: RIGHT FIFTH TOE AND METATARSAL AMPUTATION. Filetted toe flap metatarsal resection. Debridement Plantar Foot wound;  Surgeon: Evelina Bucy, DPM;  Location: [place];  Service: Podiatry;  Laterality: Right;  ? AMPUTATION Left 08/20/2018  ? Procedure: FIFTH METATARSAL BONE BIOPSY;  Surgeon: Evelina Bucy, DPM;  Location: [place];  Service: Podiatry;  Laterality: Left;  ? AMPUTATION Left 10/28/2018  ? Procedure: LEFT GREAT TOE AMPUTATION;  Surgeon: Evelina Bucy, DPM;  Location: [place];  Service: Podiatry;  Laterality: Left;  ? AMPUTATION Left 09/16/2020  ? Procedure: AMPUTATION BELOW KNEE;  Surgeon: Rosetta Posner, MD;  Location: [facility] OR;  Service: Vascular;  Laterality: Left;  ? AMPUTATION Left 10/15/2020  ? Procedure: AMPUTATION ABOVE KNEE;  Surgeon:  Elam Dutch, MD;  Location: [place];  Service: Vascular;  Laterality: Left;  ? APPLICATION OF WOUND VAC  02/02/2018  ? Procedure: APPLICATION OF WOUND VAC  Right Foot;  Surgeon: Evelina Bucy, DPM;  Location: [place];  Service: Podiatry;;  ? APPLICATION OF WOUND VAC Left 10/28/2018  ? Procedure: APPLICATION OF WOUND VAC LEFT TOE;  Surgeon: Evelina Bucy, DPM;  Location: [place];  Service: Podiatry;  Laterality: Left;  ? APPLICATION OF WOUND VAC Left 11/01/2018  ? Procedure: APPLICATION OF WOUND VAC;  Surgeon: Evelina Bucy, DPM;  Location: [place];  Service: Podiatry;  Laterality: Left;  ? AV FISTULA PLACEMENT    ? left arm.  ? AV FISTULA PLACEMENT Right 12/22/2016  ? Procedure: INSERTION OF ARTERIOVENOUS (AV) GORE-TEX GRAFT ARM;  Surgeon: Elam Dutch, MD;  Location: [facility] OR;  Service: Vascular;  Laterality: Right;  ? AV FISTULA PLACEMENT Left 05/26/2018  ? Procedure: INSERTION OF  ARTERIOVENOUS (AV) GORE-TEX GRAFT LEFT ARM;  Surgeon: Serafina Mitchell, MD;  Location: [place];  Service: Vascular;  Laterality: Left;  ? EYE SURGERY    ? I & D EXTREMITY Right 10/31/2017  ? Procedure: IRRIGATION AND DEBRIDEMENT RIGHT FOOT;  Surgeon: Evelina Bucy, DPM;  Location: [place];  Service: Podiatry;  Laterality: Right;  ? I & D EXTREMITY Left  08/20/2018  ? Procedure: IRRIGATION AND DEBRIDEMENT EXTREMITY WITH SECONDARY WOUND CLOSUREAND APPLICATION OF WOUND VAC LEFT FOOT;  Surgeon: Evelina Bucy, DPM;  Location: [place];  Service: Podiatry;  Laterality: Left;  ? I & D EXTREMITY Left 10/20/2018  ? Procedure: IRRIGATION AND DEBRIDEMENT LEFT FOOT  DEBRIDEMENT LATERAL FOOT WOUND;  Surgeon: Evelina Bucy, DPM;  Location: [place];  Service: Podiatry;  Laterality: Left;  ? I & D EXTREMITY Left 10/28/2018  ? Procedure: IRRIGATION AND DEBRIDEMENT LEFT TOE;  Surgeon: Evelina Bucy, DPM;  Location: [place];  Service: Podiatry;  Laterality: Left;  ? I & D EXTREMITY Left 09/14/2020  ? Procedure: IRRIGATION AND DEBRIDEMENT WRIST;  Surgeon:  Leanora Cover, MD;  Location: [place];  Service: Orthopedics;  Laterality: Left;  ? INSERTION OF DIALYSIS CATHETER    ? Right subclavian  ? IR AV DIALY SHUNT INTRO NEEDLE/INTRACATH INITIAL W/PTA/IMG RIGHT Right 02/05/2018  ? IR FLUORO GUIDE CV LINE RIGHT  01/31/2020  ? IR THROMBECTOMY AV FISTULA W/THROMBOLYSIS/PTA INC/SHUNT/IMG LEFT Left 08/24/2018  ? IR THROMBECTOMY AV FISTULA W/THROMBOLYSIS/PTA INC/SHUNT/IMG LEFT Left 01/06/2019  ? IR US GUIDE VASC ACCESS LEFT  08/24/2018  ? IR US GUIDE VASC ACCESS RIGHT  02/05/2018  ? IR US GUIDE VASC ACCESS RIGHT  01/31/2020  ? IRRIGATION AND DEBRIDEMENT FOOT Right 10/23/2018  ? Procedure: Irrigation and Debridement to tendon, Left Foot;  Surgeon: Evelina Bucy, DPM;  Location: [place];  Service: Podiatry;  Laterality: Right;  ? IRRIGATION AND DEBRIDEMENT FOOT Left 11/01/2018  ? Procedure: IRRIGATION AND DEBRIDEMENT PARTIAL WOUND CLOSURE LOCAL TISSUE TRANSFER AND FLAP ROTATION, LEFT FOOT;  Surgeon: Evelina Bucy, DPM;  Location: [place];  Service: Podiatry;  Laterality: Left;  ? TRANSMETATARSAL AMPUTATION N/A 08/18/2018  ? Procedure: IRRIGATION AND DEBRIDEMENT OF LEFT 5TH TOE AND TRANSMETATARSAL, WITH PARTICAL LEFT 5TH TOE AND METATARSAL AMPUTATION, BONE BIOPSY, WOUND VAC APPLICATION.;  Surgeon: Evelina Bucy, DPM;  Location: [place];  Service: Podiatry;  Laterality: N/A;  ? UPPER EXTREMITY VENOGRAPHY N/A 11/16/2016  ? Procedure: Upper Extremity Venography - Right Central;  Surgeon: Elam Dutch, MD;  Location: [place] CV LAB;  Service: Cardiovascular;  Laterality: N/A;  ? UPPER EXTREMITY VENOGRAPHY N/A 05/25/2018  ? Procedure: UPPER EXTREMITY VENOGRAPHY - Bilateral;  Surgeon: Marty Heck, MD;  Location: [place] CV LAB;  Service: Cardiovascular;  Laterality: N/A;  ? ?Patient Active Problem List  ? Diagnosis Date Noted  ? DKA (diabetic ketoacidosis) ([place]) 08/09/2021  ? Cellulitis of right leg 05/19/2021  ? Pressure injury of skin 10/15/2020  ? Adjustment disorder,  unspecified 09/21/2020  ? Malnutrition of moderate degree 09/13/2020  ? Charcot's joint of foot, left   ? Pneumonia due to COVID-19 virus 02/14/2020  ? Allergy, unspecified, initial encounter 07/17/2019  ? Diarrhea 04/24/2019  ? Encounter for orthopedic aftercare following surgical amputation   ? DM (diabetes mellitus), secondary, uncontrolled, with neurologic complications   ? Headache 09/23/2018  ? Anemia of chronic disease 08/18/2018  ? Unspecified protein-calorie malnutrition ([place]) 02/14/2018  ? Type 1 diabetes mellitus with complication, without long-term current use of insulin ([place]) 10/25/2017  ? Diabetic gastroparesis ([place]) 10/25/2017  ? Iron deficiency anemia, unspecified 10/28/2016  ? 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 Secondary hyperparathyroidism of renal origin ([place]) 10/28/2016  ? HTN (hypertension) 08/09/2014  ? ESRD on dialysis [facility]) 03/19/2014  ? Metabolic acidosis 04/88/8916  ? ? ?REFERRING DIAG: L AKA ? ?THERAPY DIAG:  ?Other abnormalities of gait  and mobility ? ?Muscle weakness (generalized) ? ?Unsteadiness on feet ? ?PERTINENT HISTORY: DMI, HTN, ESRD on dialysis (MWF)  ? ?PRECAUTIONS: fall ? ?SUBJECTIVE: Pt reports that he is here. Heart is not in it. ? ?PAIN:  ?Are you having pain? no ? ? ? ?TODAY'S TREATMENT:  ? 12/18/21: ? Prosthetics: ?Prosthetic care comments: Pt wearing 3 play sock. Educated on proper donning. Discussed trying to increase wear time to 3 hours 2x/day. ?Donning prosthesis: SBA ?Doffing prosthesis: Modified independence ?Prosthetic wear tolerance: 2.5-4 hours/day, 7 days/week ?Prosthetic weight bearing tolerance:  minutes ?Residual limb condition: skin intact ? ?GAIT: ?Gait pattern: step through pattern, decreased step length- Right, and decreased stance time- Left ?Distance walked: 230' ?Assistive device utilized: Environmental consultant - 2 wheeled and prosthetist ?Level of assistance: SBA and CGA ?Comments: Verbal and tactile cues to weight shift over prosthesis at times  with more upright posture. ? ?In // bars: taps on 4" step x 10 with RLE, standing with RLE on step x 30 sec CGA to increase left stance time. Pt was given verbal, visual cues from mirror and tactile cues for upri

## 2021-12-19 DIAGNOSIS — I10 Essential (primary) hypertension: Secondary | ICD-10-CM | POA: Diagnosis not present

## 2021-12-19 DIAGNOSIS — N2581 Secondary hyperparathyroidism of renal origin: Secondary | ICD-10-CM | POA: Diagnosis not present

## 2021-12-19 DIAGNOSIS — Z743 Need for continuous supervision: Secondary | ICD-10-CM | POA: Diagnosis not present

## 2021-12-19 DIAGNOSIS — D689 Coagulation defect, unspecified: Secondary | ICD-10-CM | POA: Diagnosis not present

## 2021-12-19 DIAGNOSIS — R531 Weakness: Secondary | ICD-10-CM | POA: Diagnosis not present

## 2021-12-19 DIAGNOSIS — S8780XA Crushing injury of unspecified lower leg, initial encounter: Secondary | ICD-10-CM | POA: Diagnosis not present

## 2021-12-19 DIAGNOSIS — Z992 Dependence on renal dialysis: Secondary | ICD-10-CM | POA: Diagnosis not present

## 2021-12-19 DIAGNOSIS — N186 End stage renal disease: Secondary | ICD-10-CM | POA: Diagnosis not present

## 2021-12-20 DIAGNOSIS — I10 Essential (primary) hypertension: Secondary | ICD-10-CM | POA: Diagnosis not present

## 2021-12-21 DIAGNOSIS — I10 Essential (primary) hypertension: Secondary | ICD-10-CM | POA: Diagnosis not present

## 2021-12-22 DIAGNOSIS — Z743 Need for continuous supervision: Secondary | ICD-10-CM | POA: Diagnosis not present

## 2021-12-22 DIAGNOSIS — R197 Diarrhea, unspecified: Secondary | ICD-10-CM | POA: Diagnosis not present

## 2021-12-22 DIAGNOSIS — N186 End stage renal disease: Secondary | ICD-10-CM | POA: Diagnosis not present

## 2021-12-22 DIAGNOSIS — I10 Essential (primary) hypertension: Secondary | ICD-10-CM | POA: Diagnosis not present

## 2021-12-22 DIAGNOSIS — D689 Coagulation defect, unspecified: Secondary | ICD-10-CM | POA: Diagnosis not present

## 2021-12-22 DIAGNOSIS — N2581 Secondary hyperparathyroidism of renal origin: Secondary | ICD-10-CM | POA: Diagnosis not present

## 2021-12-22 DIAGNOSIS — Z992 Dependence on renal dialysis: Secondary | ICD-10-CM | POA: Diagnosis not present

## 2021-12-22 DIAGNOSIS — R531 Weakness: Secondary | ICD-10-CM | POA: Diagnosis not present

## 2021-12-23 ENCOUNTER — Ambulatory Visit: Payer: Medicare HMO

## 2021-12-23 DIAGNOSIS — I10 Essential (primary) hypertension: Secondary | ICD-10-CM | POA: Diagnosis not present

## 2021-12-24 DIAGNOSIS — R531 Weakness: Secondary | ICD-10-CM | POA: Diagnosis not present

## 2021-12-24 DIAGNOSIS — Z743 Need for continuous supervision: Secondary | ICD-10-CM | POA: Diagnosis not present

## 2021-12-24 DIAGNOSIS — Z7401 Bed confinement status: Secondary | ICD-10-CM | POA: Diagnosis not present

## 2021-12-24 DIAGNOSIS — N2581 Secondary hyperparathyroidism of renal origin: Secondary | ICD-10-CM | POA: Diagnosis not present

## 2021-12-24 DIAGNOSIS — D689 Coagulation defect, unspecified: Secondary | ICD-10-CM | POA: Diagnosis not present

## 2021-12-24 DIAGNOSIS — Z992 Dependence on renal dialysis: Secondary | ICD-10-CM | POA: Diagnosis not present

## 2021-12-24 DIAGNOSIS — N186 End stage renal disease: Secondary | ICD-10-CM | POA: Diagnosis not present

## 2021-12-25 ENCOUNTER — Ambulatory Visit: Payer: Medicare HMO | Admitting: Rehabilitation

## 2021-12-25 DIAGNOSIS — I10 Essential (primary) hypertension: Secondary | ICD-10-CM | POA: Diagnosis not present

## 2021-12-26 DIAGNOSIS — D689 Coagulation defect, unspecified: Secondary | ICD-10-CM | POA: Diagnosis not present

## 2021-12-26 DIAGNOSIS — I129 Hypertensive chronic kidney disease with stage 1 through stage 4 chronic kidney disease, or unspecified chronic kidney disease: Secondary | ICD-10-CM | POA: Diagnosis not present

## 2021-12-26 DIAGNOSIS — N2581 Secondary hyperparathyroidism of renal origin: Secondary | ICD-10-CM | POA: Diagnosis not present

## 2021-12-26 DIAGNOSIS — R531 Weakness: Secondary | ICD-10-CM | POA: Diagnosis not present

## 2021-12-26 DIAGNOSIS — I10 Essential (primary) hypertension: Secondary | ICD-10-CM | POA: Diagnosis not present

## 2021-12-26 DIAGNOSIS — Z743 Need for continuous supervision: Secondary | ICD-10-CM | POA: Diagnosis not present

## 2021-12-26 DIAGNOSIS — Z992 Dependence on renal dialysis: Secondary | ICD-10-CM | POA: Diagnosis not present

## 2021-12-26 DIAGNOSIS — N186 End stage renal disease: Secondary | ICD-10-CM | POA: Diagnosis not present

## 2021-12-27 DIAGNOSIS — I10 Essential (primary) hypertension: Secondary | ICD-10-CM | POA: Diagnosis not present

## 2021-12-28 DIAGNOSIS — I10 Essential (primary) hypertension: Secondary | ICD-10-CM | POA: Diagnosis not present

## 2021-12-29 DIAGNOSIS — R531 Weakness: Secondary | ICD-10-CM | POA: Diagnosis not present

## 2021-12-29 DIAGNOSIS — Z743 Need for continuous supervision: Secondary | ICD-10-CM | POA: Diagnosis not present

## 2021-12-29 DIAGNOSIS — N2581 Secondary hyperparathyroidism of renal origin: Secondary | ICD-10-CM | POA: Diagnosis not present

## 2021-12-29 DIAGNOSIS — Z992 Dependence on renal dialysis: Secondary | ICD-10-CM | POA: Diagnosis not present

## 2021-12-29 DIAGNOSIS — D689 Coagulation defect, unspecified: Secondary | ICD-10-CM | POA: Diagnosis not present

## 2021-12-29 DIAGNOSIS — I10 Essential (primary) hypertension: Secondary | ICD-10-CM | POA: Diagnosis not present

## 2021-12-29 DIAGNOSIS — N186 End stage renal disease: Secondary | ICD-10-CM | POA: Diagnosis not present

## 2021-12-30 ENCOUNTER — Encounter: Payer: Self-pay | Admitting: Physical Therapy

## 2021-12-30 ENCOUNTER — Ambulatory Visit: Payer: Medicare HMO | Attending: Primary Care | Admitting: Physical Therapy

## 2021-12-30 DIAGNOSIS — R2689 Other abnormalities of gait and mobility: Secondary | ICD-10-CM | POA: Insufficient documentation

## 2021-12-30 DIAGNOSIS — I10 Essential (primary) hypertension: Secondary | ICD-10-CM | POA: Diagnosis not present

## 2021-12-30 DIAGNOSIS — M6281 Muscle weakness (generalized): Secondary | ICD-10-CM | POA: Insufficient documentation

## 2021-12-30 DIAGNOSIS — M25652 Stiffness of left hip, not elsewhere classified: Secondary | ICD-10-CM | POA: Insufficient documentation

## 2021-12-30 DIAGNOSIS — R2681 Unsteadiness on feet: Secondary | ICD-10-CM | POA: Insufficient documentation

## 2021-12-30 NOTE — Therapy (Signed)
?OUTPATIENT PHYSICAL THERAPY TREATMENT NOTE ? ? ?Patient Name: [name] ?MRN: [id number] ?DOB:1981/01/11, 41 y.o., male ?Today's Date: 12/30/2021 ? ?PCP: Kerin Perna, NP ?REFERRING PROVIDER: Pearson Grippe ? ? PT End of Session - 12/30/21 0937   ? ? Visit Number 7   ? Number of Visits 25   ? Date for PT Re-Evaluation 02/18/22   ? Authorization Type Aetna Medicare and Medicaid   ? Progress Note Due on Visit 10   ? PT Start Time [phone]   ? PT Stop Time 1015   ? PT Time Calculation (min) 42 min   ? Equipment Utilized During Treatment Gait belt   ? Activity Tolerance Patient tolerated treatment well   ? Behavior During Therapy [facility] for tasks assessed/performed   ? ?  ?  ? ?  ? ? ?Past Medical History:  ?Diagnosis Date  ? Anemia   ? Cellulitis and abscess of toe of left foot   ? Diabetes mellitus without complication ([place])   ? Diabetic gastroparesis ([place])   ? Diabetic ulcer of left midfoot associated with diabetes mellitus due to underlying condition, with necrosis of bone ([place])   ? Dialysis patient [facility])   ? Hypertension   ? Renal disorder   ? Dialysis  ? Sepsis ([place])   ? ?Past Surgical History:  ?Procedure Laterality Date  ? AMPUTATION Right 02/02/2018  ? Procedure: RIGHT FIFTH TOE AND METATARSAL AMPUTATION. Filetted toe flap metatarsal resection. Debridement Plantar Foot wound;  Surgeon: Evelina Bucy, DPM;  Location: [place];  Service: Podiatry;  Laterality: Right;  ? AMPUTATION Left 08/20/2018  ? Procedure: FIFTH METATARSAL BONE BIOPSY;  Surgeon: Evelina Bucy, DPM;  Location: [place];  Service: Podiatry;  Laterality: Left;  ? AMPUTATION Left 10/28/2018  ? Procedure: LEFT GREAT TOE AMPUTATION;  Surgeon: Evelina Bucy, DPM;  Location: [place];  Service: Podiatry;  Laterality: Left;  ? AMPUTATION Left 09/16/2020  ? Procedure: AMPUTATION BELOW KNEE;  Surgeon: Rosetta Posner, MD;  Location: [facility] OR;  Service: Vascular;  Laterality: Left;  ? AMPUTATION Left 10/15/2020  ? Procedure: AMPUTATION ABOVE KNEE;  Surgeon:  Elam Dutch, MD;  Location: [place];  Service: Vascular;  Laterality: Left;  ? APPLICATION OF WOUND VAC  02/02/2018  ? Procedure: APPLICATION OF WOUND VAC  Right Foot;  Surgeon: Evelina Bucy, DPM;  Location: [place];  Service: Podiatry;;  ? APPLICATION OF WOUND VAC Left 10/28/2018  ? Procedure: APPLICATION OF WOUND VAC LEFT TOE;  Surgeon: Evelina Bucy, DPM;  Location: [place];  Service: Podiatry;  Laterality: Left;  ? APPLICATION OF WOUND VAC Left 11/01/2018  ? Procedure: APPLICATION OF WOUND VAC;  Surgeon: Evelina Bucy, DPM;  Location: [place];  Service: Podiatry;  Laterality: Left;  ? AV FISTULA PLACEMENT    ? left arm.  ? AV FISTULA PLACEMENT Right 12/22/2016  ? Procedure: INSERTION OF ARTERIOVENOUS (AV) GORE-TEX GRAFT ARM;  Surgeon: Elam Dutch, MD;  Location: [facility] OR;  Service: Vascular;  Laterality: Right;  ? AV FISTULA PLACEMENT Left 05/26/2018  ? Procedure: INSERTION OF  ARTERIOVENOUS (AV) GORE-TEX GRAFT LEFT ARM;  Surgeon: Serafina Mitchell, MD;  Location: [place];  Service: Vascular;  Laterality: Left;  ? EYE SURGERY    ? I & D EXTREMITY Right 10/31/2017  ? Procedure: IRRIGATION AND DEBRIDEMENT RIGHT FOOT;  Surgeon: Evelina Bucy, DPM;  Location: [place];  Service: Podiatry;  Laterality: Right;  ? I & D EXTREMITY Left  08/20/2018  ? Procedure: IRRIGATION AND DEBRIDEMENT EXTREMITY WITH SECONDARY WOUND CLOSUREAND APPLICATION OF WOUND VAC LEFT FOOT;  Surgeon: Evelina Bucy, DPM;  Location: [place];  Service: Podiatry;  Laterality: Left;  ? I & D EXTREMITY Left 10/20/2018  ? Procedure: IRRIGATION AND DEBRIDEMENT LEFT FOOT  DEBRIDEMENT LATERAL FOOT WOUND;  Surgeon: Evelina Bucy, DPM;  Location: [place];  Service: Podiatry;  Laterality: Left;  ? I & D EXTREMITY Left 10/28/2018  ? Procedure: IRRIGATION AND DEBRIDEMENT LEFT TOE;  Surgeon: Evelina Bucy, DPM;  Location: [place];  Service: Podiatry;  Laterality: Left;  ? I & D EXTREMITY Left 09/14/2020  ? Procedure: IRRIGATION AND DEBRIDEMENT WRIST;  Surgeon:  Leanora Cover, MD;  Location: [place];  Service: Orthopedics;  Laterality: Left;  ? INSERTION OF DIALYSIS CATHETER    ? Right subclavian  ? IR AV DIALY SHUNT INTRO NEEDLE/INTRACATH INITIAL W/PTA/IMG RIGHT Right 02/05/2018  ? IR FLUORO GUIDE CV LINE RIGHT  01/31/2020  ? IR THROMBECTOMY AV FISTULA W/THROMBOLYSIS/PTA INC/SHUNT/IMG LEFT Left 08/24/2018  ? IR THROMBECTOMY AV FISTULA W/THROMBOLYSIS/PTA INC/SHUNT/IMG LEFT Left 01/06/2019  ? IR US GUIDE VASC ACCESS LEFT  08/24/2018  ? IR US GUIDE VASC ACCESS RIGHT  02/05/2018  ? IR US GUIDE VASC ACCESS RIGHT  01/31/2020  ? IRRIGATION AND DEBRIDEMENT FOOT Right 10/23/2018  ? Procedure: Irrigation and Debridement to tendon, Left Foot;  Surgeon: Evelina Bucy, DPM;  Location: [place];  Service: Podiatry;  Laterality: Right;  ? IRRIGATION AND DEBRIDEMENT FOOT Left 11/01/2018  ? Procedure: IRRIGATION AND DEBRIDEMENT PARTIAL WOUND CLOSURE LOCAL TISSUE TRANSFER AND FLAP ROTATION, LEFT FOOT;  Surgeon: Evelina Bucy, DPM;  Location: [place];  Service: Podiatry;  Laterality: Left;  ? TRANSMETATARSAL AMPUTATION N/A 08/18/2018  ? Procedure: IRRIGATION AND DEBRIDEMENT OF LEFT 5TH TOE AND TRANSMETATARSAL, WITH PARTICAL LEFT 5TH TOE AND METATARSAL AMPUTATION, BONE BIOPSY, WOUND VAC APPLICATION.;  Surgeon: Evelina Bucy, DPM;  Location: [place];  Service: Podiatry;  Laterality: N/A;  ? UPPER EXTREMITY VENOGRAPHY N/A 11/16/2016  ? Procedure: Upper Extremity Venography - Right Central;  Surgeon: Elam Dutch, MD;  Location: [place] CV LAB;  Service: Cardiovascular;  Laterality: N/A;  ? UPPER EXTREMITY VENOGRAPHY N/A 05/25/2018  ? Procedure: UPPER EXTREMITY VENOGRAPHY - Bilateral;  Surgeon: Marty Heck, MD;  Location: [place] CV LAB;  Service: Cardiovascular;  Laterality: N/A;  ? ?Patient Active Problem List  ? Diagnosis Date Noted  ? DKA (diabetic ketoacidosis) ([place]) 08/09/2021  ? Cellulitis of right leg 05/19/2021  ? Pressure injury of skin 10/15/2020  ? Adjustment disorder,  unspecified 09/21/2020  ? Malnutrition of moderate degree 09/13/2020  ? Charcot's joint of foot, left   ? Pneumonia due to COVID-19 virus 02/14/2020  ? Allergy, unspecified, initial encounter 07/17/2019  ? Diarrhea 04/24/2019  ? Encounter for orthopedic aftercare following surgical amputation   ? DM (diabetes mellitus), secondary, uncontrolled, with neurologic complications   ? Headache 09/23/2018  ? Anemia of chronic disease 08/18/2018  ? Unspecified protein-calorie malnutrition ([place]) 02/14/2018  ? Type 1 diabetes mellitus with complication, without long-term current use of insulin ([place]) 10/25/2017  ? Diabetic gastroparesis ([place]) 10/25/2017  ? Iron deficiency anemia, unspecified 10/28/2016  ? Other specified coagulation defects ([place]) 10/28/2016  ? Secondary hyperparathyroidism of renal origin ([place]) 10/28/2016  ? HTN (hypertension) 08/09/2014  ? ESRD on dialysis [facility]) 03/19/2014  ? Metabolic acidosis 86/57/8469  ? ? ?REFERRING DIAG: L AKA ? ?THERAPY DIAG:  ?Other abnormalities of gait  and mobility ? ?Muscle weakness (generalized) ? ?Unsteadiness on feet ? ?PERTINENT HISTORY: DMI, HTN, ESRD on dialysis (MWF)  ? ?PRECAUTIONS: fall ? ?SUBJECTIVE: Has been having issues with IBS for over a week now. Missed 2 visits with [place] due to this and had to cut dialysis short on 2 days as well so now has fluid overload as well. Still feeling well today, was asleep on toilet with transport showed up.  ? ?PAIN:  ?Are you having pain? no ? ? ? ?TODAY'S TREATMENT:  ?  12/18/21: ?PROSTHETIC  ?Donning prosthesis: SBA ?Doffing prosthesis: Modified independence ?Prosthetic wear tolerance: 3-4 hours 2 x day, 7 days/week ?Prosthetic weight bearing tolerance:  limited ?Residual limb condition: skin intact per pt report ?Comments: pt still very discouraged over look of prosthesis with negative feelings about it and how it impacts his lift.  Has 3 ply sock on currently.  ? ?THERAPEUTIC ACTIVITY: ?Worked on technique for picking up light items  from floor with cues on prosthetic placement/technique x 5 reps prior to performance of [employer]. Min assist needed initially progressing to supervision ? ? [facility] Adult PT Treatment/Exercise - 12/30/21 1552

## 2021-12-30 NOTE — Patient Instructions (Signed)
Do each exercise 2  times per day ?Do each exercise 10 repetitions ?Hold each exercise for 5 seconds to feel your location ? ?AT [place] AND PLACE FEET EQUAL DISTANCE FROM THE MIDLINE. ? ?USE TAPE ON FLOOR TO MARK THE MIDLINE POSITION. ?You also should try to feel with your limb pressure in socket.  You are trying to feel with limb what you used to feel with the bottom of your foot. ? ?Side to Side Shift: Moving your hips only (not shoulders): move weight onto your left leg, HOLD/FEEL.  Move back to equal weight on each leg, HOLD/FEEL. Move weight onto your right leg, HOLD/FEEL. Move back to equal weight on each leg, HOLD/FEEL. Repeat. ?Front to Back Shift: Moving your hips only (not shoulders): move your weight forward onto your toes, HOLD/FEEL. Move your weight back to equal Flat Foot on both legs, HOLD/FEEL. Move your weight back onto your heels, HOLD/FEEL. Move your weight back to equal on both legs, HOLD/FEEL. Repeat. ?Moving Cones / Cups: With equal weight on each leg: Hold on with one hand the first time, then progress to no hand supports. Move cups from one side of sink to the other. Place cups ~2? out of your reach, progress to 10? beyond reach. ?Overhead/Upward Reaching: alternated reaching up to top cabinets or ceiling if no cabinets present. Keep equal weight on each leg. Start with one hand support on counter while other hand reaches and progress to no hand support with reaching. ?5.   Looking Over Shoulders: With equal weight on each leg: alternate turning to look          over your shoulders with one hand support on counter as needed. Shift weight to             side looking, pull hip then shoulder then head/eyes around to look behind you. Start       with one hand support & progress to no hand support. ?

## 2021-12-31 DIAGNOSIS — R531 Weakness: Secondary | ICD-10-CM | POA: Diagnosis not present

## 2021-12-31 DIAGNOSIS — D689 Coagulation defect, unspecified: Secondary | ICD-10-CM | POA: Diagnosis not present

## 2021-12-31 DIAGNOSIS — Z743 Need for continuous supervision: Secondary | ICD-10-CM | POA: Diagnosis not present

## 2021-12-31 DIAGNOSIS — Z992 Dependence on renal dialysis: Secondary | ICD-10-CM | POA: Diagnosis not present

## 2021-12-31 DIAGNOSIS — I10 Essential (primary) hypertension: Secondary | ICD-10-CM | POA: Diagnosis not present

## 2021-12-31 DIAGNOSIS — N2581 Secondary hyperparathyroidism of renal origin: Secondary | ICD-10-CM | POA: Diagnosis not present

## 2021-12-31 DIAGNOSIS — N186 End stage renal disease: Secondary | ICD-10-CM | POA: Diagnosis not present

## 2022-01-01 ENCOUNTER — Ambulatory Visit: Payer: Medicare HMO

## 2022-01-01 ENCOUNTER — Telehealth: Payer: Self-pay

## 2022-01-01 DIAGNOSIS — I10 Essential (primary) hypertension: Secondary | ICD-10-CM | POA: Diagnosis not present

## 2022-01-01 NOTE — Telephone Encounter (Signed)
Pt arrived to therapy session with nonemergent EMS for transport. When went to get him in lobby, EMTs were checking his sugar as he felt like it was low. Found to be 19. Gave pt a [employer] to help. EMTs offering a glucose pack in [place] but pt declined stating the soda should be ok. PT held therapy session due to low blood sugar and PT unable to check to monitor. Discussed checking when gets home and monitoring and pt stated he would be fine. Aunt was at home as well. ?Cherly Anderson, PT, DPT, NCS ? ?

## 2022-01-02 DIAGNOSIS — D689 Coagulation defect, unspecified: Secondary | ICD-10-CM | POA: Diagnosis not present

## 2022-01-02 DIAGNOSIS — R531 Weakness: Secondary | ICD-10-CM | POA: Diagnosis not present

## 2022-01-02 DIAGNOSIS — I10 Essential (primary) hypertension: Secondary | ICD-10-CM | POA: Diagnosis not present

## 2022-01-02 DIAGNOSIS — N2581 Secondary hyperparathyroidism of renal origin: Secondary | ICD-10-CM | POA: Diagnosis not present

## 2022-01-02 DIAGNOSIS — N186 End stage renal disease: Secondary | ICD-10-CM | POA: Diagnosis not present

## 2022-01-02 DIAGNOSIS — Z743 Need for continuous supervision: Secondary | ICD-10-CM | POA: Diagnosis not present

## 2022-01-02 DIAGNOSIS — Z992 Dependence on renal dialysis: Secondary | ICD-10-CM | POA: Diagnosis not present

## 2022-01-03 DIAGNOSIS — R531 Weakness: Secondary | ICD-10-CM | POA: Diagnosis not present

## 2022-01-03 DIAGNOSIS — N186 End stage renal disease: Secondary | ICD-10-CM | POA: Diagnosis not present

## 2022-01-03 DIAGNOSIS — D689 Coagulation defect, unspecified: Secondary | ICD-10-CM | POA: Diagnosis not present

## 2022-01-03 DIAGNOSIS — N2581 Secondary hyperparathyroidism of renal origin: Secondary | ICD-10-CM | POA: Diagnosis not present

## 2022-01-03 DIAGNOSIS — Z992 Dependence on renal dialysis: Secondary | ICD-10-CM | POA: Diagnosis not present

## 2022-01-03 DIAGNOSIS — I10 Essential (primary) hypertension: Secondary | ICD-10-CM | POA: Diagnosis not present

## 2022-01-03 DIAGNOSIS — E889 Metabolic disorder, unspecified: Secondary | ICD-10-CM | POA: Diagnosis not present

## 2022-01-03 DIAGNOSIS — Z743 Need for continuous supervision: Secondary | ICD-10-CM | POA: Diagnosis not present

## 2022-01-04 DIAGNOSIS — I10 Essential (primary) hypertension: Secondary | ICD-10-CM | POA: Diagnosis not present

## 2022-01-05 DIAGNOSIS — E8770 Fluid overload, unspecified: Secondary | ICD-10-CM | POA: Diagnosis not present

## 2022-01-05 DIAGNOSIS — Z743 Need for continuous supervision: Secondary | ICD-10-CM | POA: Diagnosis not present

## 2022-01-05 DIAGNOSIS — T8249XA Other complication of vascular dialysis catheter, initial encounter: Secondary | ICD-10-CM | POA: Diagnosis not present

## 2022-01-05 DIAGNOSIS — R531 Weakness: Secondary | ICD-10-CM | POA: Diagnosis not present

## 2022-01-05 DIAGNOSIS — D689 Coagulation defect, unspecified: Secondary | ICD-10-CM | POA: Diagnosis not present

## 2022-01-05 DIAGNOSIS — I10 Essential (primary) hypertension: Secondary | ICD-10-CM | POA: Diagnosis not present

## 2022-01-05 DIAGNOSIS — N2581 Secondary hyperparathyroidism of renal origin: Secondary | ICD-10-CM | POA: Diagnosis not present

## 2022-01-05 DIAGNOSIS — D688 Other specified coagulation defects: Secondary | ICD-10-CM | POA: Diagnosis not present

## 2022-01-05 DIAGNOSIS — N186 End stage renal disease: Secondary | ICD-10-CM | POA: Diagnosis not present

## 2022-01-05 DIAGNOSIS — Z992 Dependence on renal dialysis: Secondary | ICD-10-CM | POA: Diagnosis not present

## 2022-01-06 DIAGNOSIS — I10 Essential (primary) hypertension: Secondary | ICD-10-CM | POA: Diagnosis not present

## 2022-01-07 DIAGNOSIS — R531 Weakness: Secondary | ICD-10-CM | POA: Diagnosis not present

## 2022-01-07 DIAGNOSIS — G8929 Other chronic pain: Secondary | ICD-10-CM | POA: Diagnosis not present

## 2022-01-07 DIAGNOSIS — T8249XA Other complication of vascular dialysis catheter, initial encounter: Secondary | ICD-10-CM | POA: Diagnosis not present

## 2022-01-07 DIAGNOSIS — D689 Coagulation defect, unspecified: Secondary | ICD-10-CM | POA: Diagnosis not present

## 2022-01-07 DIAGNOSIS — E8770 Fluid overload, unspecified: Secondary | ICD-10-CM | POA: Diagnosis not present

## 2022-01-07 DIAGNOSIS — Z992 Dependence on renal dialysis: Secondary | ICD-10-CM | POA: Diagnosis not present

## 2022-01-07 DIAGNOSIS — N186 End stage renal disease: Secondary | ICD-10-CM | POA: Diagnosis not present

## 2022-01-07 DIAGNOSIS — I10 Essential (primary) hypertension: Secondary | ICD-10-CM | POA: Diagnosis not present

## 2022-01-07 DIAGNOSIS — N2581 Secondary hyperparathyroidism of renal origin: Secondary | ICD-10-CM | POA: Diagnosis not present

## 2022-01-07 DIAGNOSIS — Z743 Need for continuous supervision: Secondary | ICD-10-CM | POA: Diagnosis not present

## 2022-01-07 DIAGNOSIS — D688 Other specified coagulation defects: Secondary | ICD-10-CM | POA: Diagnosis not present

## 2022-01-08 DIAGNOSIS — I10 Essential (primary) hypertension: Secondary | ICD-10-CM | POA: Diagnosis not present

## 2022-01-09 DIAGNOSIS — Z743 Need for continuous supervision: Secondary | ICD-10-CM | POA: Diagnosis not present

## 2022-01-09 DIAGNOSIS — N186 End stage renal disease: Secondary | ICD-10-CM | POA: Diagnosis not present

## 2022-01-09 DIAGNOSIS — I10 Essential (primary) hypertension: Secondary | ICD-10-CM | POA: Diagnosis not present

## 2022-01-09 DIAGNOSIS — E8770 Fluid overload, unspecified: Secondary | ICD-10-CM | POA: Diagnosis not present

## 2022-01-09 DIAGNOSIS — D689 Coagulation defect, unspecified: Secondary | ICD-10-CM | POA: Diagnosis not present

## 2022-01-09 DIAGNOSIS — R531 Weakness: Secondary | ICD-10-CM | POA: Diagnosis not present

## 2022-01-09 DIAGNOSIS — Z992 Dependence on renal dialysis: Secondary | ICD-10-CM | POA: Diagnosis not present

## 2022-01-09 DIAGNOSIS — N2581 Secondary hyperparathyroidism of renal origin: Secondary | ICD-10-CM | POA: Diagnosis not present

## 2022-01-09 DIAGNOSIS — T8249XA Other complication of vascular dialysis catheter, initial encounter: Secondary | ICD-10-CM | POA: Diagnosis not present

## 2022-01-09 DIAGNOSIS — R197 Diarrhea, unspecified: Secondary | ICD-10-CM | POA: Diagnosis not present

## 2022-01-09 DIAGNOSIS — D688 Other specified coagulation defects: Secondary | ICD-10-CM | POA: Diagnosis not present

## 2022-01-10 DIAGNOSIS — I10 Essential (primary) hypertension: Secondary | ICD-10-CM | POA: Diagnosis not present

## 2022-01-11 DIAGNOSIS — I10 Essential (primary) hypertension: Secondary | ICD-10-CM | POA: Diagnosis not present

## 2022-01-12 DIAGNOSIS — R531 Weakness: Secondary | ICD-10-CM | POA: Diagnosis not present

## 2022-01-12 DIAGNOSIS — D689 Coagulation defect, unspecified: Secondary | ICD-10-CM | POA: Diagnosis not present

## 2022-01-12 DIAGNOSIS — I10 Essential (primary) hypertension: Secondary | ICD-10-CM | POA: Diagnosis not present

## 2022-01-12 DIAGNOSIS — N2581 Secondary hyperparathyroidism of renal origin: Secondary | ICD-10-CM | POA: Diagnosis not present

## 2022-01-12 DIAGNOSIS — Z743 Need for continuous supervision: Secondary | ICD-10-CM | POA: Diagnosis not present

## 2022-01-12 DIAGNOSIS — N186 End stage renal disease: Secondary | ICD-10-CM | POA: Diagnosis not present

## 2022-01-12 DIAGNOSIS — T8249XA Other complication of vascular dialysis catheter, initial encounter: Secondary | ICD-10-CM | POA: Diagnosis not present

## 2022-01-12 DIAGNOSIS — D631 Anemia in chronic kidney disease: Secondary | ICD-10-CM | POA: Diagnosis not present

## 2022-01-12 DIAGNOSIS — Z992 Dependence on renal dialysis: Secondary | ICD-10-CM | POA: Diagnosis not present

## 2022-01-12 DIAGNOSIS — Z7401 Bed confinement status: Secondary | ICD-10-CM | POA: Diagnosis not present

## 2022-01-13 ENCOUNTER — Ambulatory Visit: Payer: Medicare HMO

## 2022-01-13 DIAGNOSIS — M6281 Muscle weakness (generalized): Secondary | ICD-10-CM | POA: Diagnosis not present

## 2022-01-13 DIAGNOSIS — M25652 Stiffness of left hip, not elsewhere classified: Secondary | ICD-10-CM | POA: Diagnosis not present

## 2022-01-13 DIAGNOSIS — I10 Essential (primary) hypertension: Secondary | ICD-10-CM | POA: Diagnosis not present

## 2022-01-13 DIAGNOSIS — R2681 Unsteadiness on feet: Secondary | ICD-10-CM | POA: Diagnosis not present

## 2022-01-13 DIAGNOSIS — R2689 Other abnormalities of gait and mobility: Secondary | ICD-10-CM

## 2022-01-13 NOTE — Therapy (Signed)
?OUTPATIENT PHYSICAL THERAPY TREATMENT NOTE ? ? ?Patient Name: [name] ?MRN: [id number] ?DOB:June 26, 1981, 41 y.o., male ?Today's Date: 01/13/2022 ? ?PCP: Kerin Perna, NP ?REFERRING PROVIDER: Pearson Grippe ? ? PT End of Session - 01/13/22 1616   ? ? Visit Number 8   ? Number of Visits 25   ? Date for PT Re-Evaluation 02/18/22   ? Authorization Type Aetna Medicare and Medicaid   ? Progress Note Due on Visit 10   ? PT Start Time 0930   ? PT Stop Time 1015   ? PT Time Calculation (min) 45 min   ? Equipment Utilized During Treatment Gait belt   ? Activity Tolerance Patient tolerated treatment well   ? Behavior During Therapy [name] for tasks assessed/performed   ? ?  ?  ? ?  ? ? ? ?Past Medical History:  ?Diagnosis Date  ? Anemia   ? Cellulitis and abscess of toe of left foot   ? Diabetes mellitus without complication ([place])   ? Diabetic gastroparesis ([place])   ? Diabetic ulcer of left midfoot associated with diabetes mellitus due to underlying condition, with necrosis of bone ([place])   ? Dialysis patient [facility])   ? Hypertension   ? Renal disorder   ? Dialysis  ? Sepsis ([place])   ? ?Past Surgical History:  ?Procedure Laterality Date  ? AMPUTATION Right 02/02/2018  ? Procedure: RIGHT FIFTH TOE AND METATARSAL AMPUTATION. Filetted toe flap metatarsal resection. Debridement Plantar Foot wound;  Surgeon: Evelina Bucy, DPM;  Location: [place];  Service: Podiatry;  Laterality: Right;  ? AMPUTATION Left 08/20/2018  ? Procedure: FIFTH METATARSAL BONE BIOPSY;  Surgeon: Evelina Bucy, DPM;  Location: [place];  Service: Podiatry;  Laterality: Left;  ? AMPUTATION Left 10/28/2018  ? Procedure: LEFT GREAT TOE AMPUTATION;  Surgeon: Evelina Bucy, DPM;  Location: [place];  Service: Podiatry;  Laterality: Left;  ? AMPUTATION Left 09/16/2020  ? Procedure: AMPUTATION BELOW KNEE;  Surgeon: Rosetta Posner, MD;  Location: [facility] OR;  Service: Vascular;  Laterality: Left;  ? AMPUTATION Left 10/15/2020  ? Procedure: AMPUTATION ABOVE KNEE;  Surgeon:  Elam Dutch, MD;  Location: [place];  Service: Vascular;  Laterality: Left;  ? APPLICATION OF WOUND VAC  02/02/2018  ? Procedure: APPLICATION OF WOUND VAC  Right Foot;  Surgeon: Evelina Bucy, DPM;  Location: [place];  Service: Podiatry;;  ? APPLICATION OF WOUND VAC Left 10/28/2018  ? Procedure: APPLICATION OF WOUND VAC LEFT TOE;  Surgeon: Evelina Bucy, DPM;  Location: [place];  Service: Podiatry;  Laterality: Left;  ? APPLICATION OF WOUND VAC Left 11/01/2018  ? Procedure: APPLICATION OF WOUND VAC;  Surgeon: Evelina Bucy, DPM;  Location: [place];  Service: Podiatry;  Laterality: Left;  ? AV FISTULA PLACEMENT    ? left arm.  ? AV FISTULA PLACEMENT Right 12/22/2016  ? Procedure: INSERTION OF ARTERIOVENOUS (AV) GORE-TEX GRAFT ARM;  Surgeon: Elam Dutch, MD;  Location: [facility] OR;  Service: Vascular;  Laterality: Right;  ? AV FISTULA PLACEMENT Left 05/26/2018  ? Procedure: INSERTION OF  ARTERIOVENOUS (AV) GORE-TEX GRAFT LEFT ARM;  Surgeon: Serafina Mitchell, MD;  Location: [place];  Service: Vascular;  Laterality: Left;  ? EYE SURGERY    ? I & D EXTREMITY Right 10/31/2017  ? Procedure: IRRIGATION AND DEBRIDEMENT RIGHT FOOT;  Surgeon: Evelina Bucy, DPM;  Location: [place];  Service: Podiatry;  Laterality: Right;  ? I & D EXTREMITY  Left 08/20/2018  ? Procedure: IRRIGATION AND DEBRIDEMENT EXTREMITY WITH SECONDARY WOUND CLOSUREAND APPLICATION OF WOUND VAC LEFT FOOT;  Surgeon: Evelina Bucy, DPM;  Location: [place];  Service: Podiatry;  Laterality: Left;  ? I & D EXTREMITY Left 10/20/2018  ? Procedure: IRRIGATION AND DEBRIDEMENT LEFT FOOT  DEBRIDEMENT LATERAL FOOT WOUND;  Surgeon: Evelina Bucy, DPM;  Location: [place];  Service: Podiatry;  Laterality: Left;  ? I & D EXTREMITY Left 10/28/2018  ? Procedure: IRRIGATION AND DEBRIDEMENT LEFT TOE;  Surgeon: Evelina Bucy, DPM;  Location: [place];  Service: Podiatry;  Laterality: Left;  ? I & D EXTREMITY Left 09/14/2020  ? Procedure: IRRIGATION AND DEBRIDEMENT WRIST;  Surgeon:  Leanora Cover, MD;  Location: [place];  Service: Orthopedics;  Laterality: Left;  ? INSERTION OF DIALYSIS CATHETER    ? Right subclavian  ? IR AV DIALY SHUNT INTRO NEEDLE/INTRACATH INITIAL W/PTA/IMG RIGHT Right 02/05/2018  ? IR FLUORO GUIDE CV LINE RIGHT  01/31/2020  ? IR THROMBECTOMY AV FISTULA W/THROMBOLYSIS/PTA INC/SHUNT/IMG LEFT Left 08/24/2018  ? IR THROMBECTOMY AV FISTULA W/THROMBOLYSIS/PTA INC/SHUNT/IMG LEFT Left 01/06/2019  ? IR US GUIDE VASC ACCESS LEFT  08/24/2018  ? IR US GUIDE VASC ACCESS RIGHT  02/05/2018  ? IR US GUIDE VASC ACCESS RIGHT  01/31/2020  ? IRRIGATION AND DEBRIDEMENT FOOT Right 10/23/2018  ? Procedure: Irrigation and Debridement to tendon, Left Foot;  Surgeon: Evelina Bucy, DPM;  Location: [place];  Service: Podiatry;  Laterality: Right;  ? IRRIGATION AND DEBRIDEMENT FOOT Left 11/01/2018  ? Procedure: IRRIGATION AND DEBRIDEMENT PARTIAL WOUND CLOSURE LOCAL TISSUE TRANSFER AND FLAP ROTATION, LEFT FOOT;  Surgeon: Evelina Bucy, DPM;  Location: [place];  Service: Podiatry;  Laterality: Left;  ? TRANSMETATARSAL AMPUTATION N/A 08/18/2018  ? Procedure: IRRIGATION AND DEBRIDEMENT OF LEFT 5TH TOE AND TRANSMETATARSAL, WITH PARTICAL LEFT 5TH TOE AND METATARSAL AMPUTATION, BONE BIOPSY, WOUND VAC APPLICATION.;  Surgeon: Evelina Bucy, DPM;  Location: [place];  Service: Podiatry;  Laterality: N/A;  ? UPPER EXTREMITY VENOGRAPHY N/A 11/16/2016  ? Procedure: Upper Extremity Venography - Right Central;  Surgeon: Elam Dutch, MD;  Location: [place] CV LAB;  Service: Cardiovascular;  Laterality: N/A;  ? UPPER EXTREMITY VENOGRAPHY N/A 05/25/2018  ? Procedure: UPPER EXTREMITY VENOGRAPHY - Bilateral;  Surgeon: Marty Heck, MD;  Location: [place] CV LAB;  Service: Cardiovascular;  Laterality: N/A;  ? ?Patient Active Problem List  ? Diagnosis Date Noted  ? DKA (diabetic ketoacidosis) ([place]) 08/09/2021  ? Cellulitis of right leg 05/19/2021  ? Pressure injury of skin 10/15/2020  ? Adjustment disorder,  unspecified 09/21/2020  ? Malnutrition of moderate degree 09/13/2020  ? Charcot's joint of foot, left   ? Pneumonia due to COVID-19 virus 02/14/2020  ? Allergy, unspecified, initial encounter 07/17/2019  ? Diarrhea 04/24/2019  ? Encounter for orthopedic aftercare following surgical amputation   ? DM (diabetes mellitus), secondary, uncontrolled, with neurologic complications   ? Headache 09/23/2018  ? Anemia of chronic disease 08/18/2018  ? Unspecified protein-calorie malnutrition ([place]) 02/14/2018  ? Type 1 diabetes mellitus with complication, without long-term current use of insulin ([place]) 10/25/2017  ? Diabetic gastroparesis ([place]) 10/25/2017  ? Iron deficiency anemia, unspecified 10/28/2016  ? 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 Secondary hyperparathyroidism of renal origin ([place]) 10/28/2016  ? HTN (hypertension) 08/09/2014  ? ESRD on dialysis [facility]) 03/19/2014  ? Metabolic acidosis 28/41/3244  ? ? ?REFERRING DIAG: L AKA ? ?THERAPY DIAG:  ?Other abnormalities of  gait and mobility ? ?Muscle weakness (generalized) ? ?Unsteadiness on feet ? ?Stiffness of left hip, not elsewhere classified ? ?PERTINENT HISTORY: DMI, HTN, ESRD on dialysis (MWF)  ? ?PRECAUTIONS: fall ? ?SUBJECTIVE: Pt reports he is doing okay. [place] sugar was 160 at home today. Denies falls.  ? ?PAIN:  ?PAIN:  ?Are you having pain? Yes: NPRS scale: 5/10 ?Pain location: Phantom pain in L lower leg ?Pain description: phantom, neuropathic ?Aggravating factors: none ?Relieving factors: none ? ? ? ? ?TODAY'S TREATMENT:  ?01/13/22: ?BP 159/109 at the beginning of the session on R upper arm ?BP 73/53 on the R wrist ?Gait training: 1 x 115' with RW, cues to take longer step with R LE to improve WB through L forefoot to faciliate knee flexion in prosthetic leg during mid to late stance on L LE and to reduce L circumduction ?BP after walking: 149/104, 90bpm ?10 meter walk test: 65 sec = 0.5ms ?Stairs: 4 steps, rail on L going up, bil UE, step to  pattern, CGA ?Gait training: with quad cane: 30 feet total with 3 pt gait pattern and min A ?BP: 142/89, 91bpm ? ?  ? ?12/18/21: ?PROSTHETIC  ?Donning prosthesis: SBA ?Doffing prosthesis: Modified independence ?

## 2022-01-14 DIAGNOSIS — Z743 Need for continuous supervision: Secondary | ICD-10-CM | POA: Diagnosis not present

## 2022-01-14 DIAGNOSIS — D689 Coagulation defect, unspecified: Secondary | ICD-10-CM | POA: Diagnosis not present

## 2022-01-14 DIAGNOSIS — Z992 Dependence on renal dialysis: Secondary | ICD-10-CM | POA: Diagnosis not present

## 2022-01-14 DIAGNOSIS — R531 Weakness: Secondary | ICD-10-CM | POA: Diagnosis not present

## 2022-01-14 DIAGNOSIS — D631 Anemia in chronic kidney disease: Secondary | ICD-10-CM | POA: Diagnosis not present

## 2022-01-14 DIAGNOSIS — N2581 Secondary hyperparathyroidism of renal origin: Secondary | ICD-10-CM | POA: Diagnosis not present

## 2022-01-14 DIAGNOSIS — N186 End stage renal disease: Secondary | ICD-10-CM | POA: Diagnosis not present

## 2022-01-14 DIAGNOSIS — T8249XA Other complication of vascular dialysis catheter, initial encounter: Secondary | ICD-10-CM | POA: Diagnosis not present

## 2022-01-14 DIAGNOSIS — I10 Essential (primary) hypertension: Secondary | ICD-10-CM | POA: Diagnosis not present

## 2022-01-15 ENCOUNTER — Ambulatory Visit: Payer: Medicare HMO

## 2022-01-15 DIAGNOSIS — Z743 Need for continuous supervision: Secondary | ICD-10-CM | POA: Diagnosis not present

## 2022-01-15 DIAGNOSIS — N186 End stage renal disease: Secondary | ICD-10-CM | POA: Diagnosis not present

## 2022-01-15 DIAGNOSIS — I10 Essential (primary) hypertension: Secondary | ICD-10-CM | POA: Diagnosis not present

## 2022-01-15 DIAGNOSIS — G8929 Other chronic pain: Secondary | ICD-10-CM | POA: Diagnosis not present

## 2022-01-15 DIAGNOSIS — Z992 Dependence on renal dialysis: Secondary | ICD-10-CM | POA: Diagnosis not present

## 2022-01-15 DIAGNOSIS — T8249XA Other complication of vascular dialysis catheter, initial encounter: Secondary | ICD-10-CM | POA: Diagnosis not present

## 2022-01-15 DIAGNOSIS — R531 Weakness: Secondary | ICD-10-CM | POA: Diagnosis not present

## 2022-01-16 DIAGNOSIS — T8249XA Other complication of vascular dialysis catheter, initial encounter: Secondary | ICD-10-CM | POA: Diagnosis not present

## 2022-01-16 DIAGNOSIS — D631 Anemia in chronic kidney disease: Secondary | ICD-10-CM | POA: Diagnosis not present

## 2022-01-16 DIAGNOSIS — N2581 Secondary hyperparathyroidism of renal origin: Secondary | ICD-10-CM | POA: Diagnosis not present

## 2022-01-16 DIAGNOSIS — R531 Weakness: Secondary | ICD-10-CM | POA: Diagnosis not present

## 2022-01-16 DIAGNOSIS — D689 Coagulation defect, unspecified: Secondary | ICD-10-CM | POA: Diagnosis not present

## 2022-01-16 DIAGNOSIS — N186 End stage renal disease: Secondary | ICD-10-CM | POA: Diagnosis not present

## 2022-01-16 DIAGNOSIS — Z743 Need for continuous supervision: Secondary | ICD-10-CM | POA: Diagnosis not present

## 2022-01-16 DIAGNOSIS — I10 Essential (primary) hypertension: Secondary | ICD-10-CM | POA: Diagnosis not present

## 2022-01-16 DIAGNOSIS — Z992 Dependence on renal dialysis: Secondary | ICD-10-CM | POA: Diagnosis not present

## 2022-01-17 DIAGNOSIS — I10 Essential (primary) hypertension: Secondary | ICD-10-CM | POA: Diagnosis not present

## 2022-01-18 DIAGNOSIS — I10 Essential (primary) hypertension: Secondary | ICD-10-CM | POA: Diagnosis not present

## 2022-01-19 DIAGNOSIS — N186 End stage renal disease: Secondary | ICD-10-CM | POA: Diagnosis not present

## 2022-01-19 DIAGNOSIS — Z992 Dependence on renal dialysis: Secondary | ICD-10-CM | POA: Diagnosis not present

## 2022-01-19 DIAGNOSIS — R531 Weakness: Secondary | ICD-10-CM | POA: Diagnosis not present

## 2022-01-19 DIAGNOSIS — I10 Essential (primary) hypertension: Secondary | ICD-10-CM | POA: Diagnosis not present

## 2022-01-19 DIAGNOSIS — Z743 Need for continuous supervision: Secondary | ICD-10-CM | POA: Diagnosis not present

## 2022-01-19 DIAGNOSIS — N2581 Secondary hyperparathyroidism of renal origin: Secondary | ICD-10-CM | POA: Diagnosis not present

## 2022-01-19 DIAGNOSIS — D689 Coagulation defect, unspecified: Secondary | ICD-10-CM | POA: Diagnosis not present

## 2022-01-19 DIAGNOSIS — T8249XA Other complication of vascular dialysis catheter, initial encounter: Secondary | ICD-10-CM | POA: Diagnosis not present

## 2022-01-19 DIAGNOSIS — R2689 Other abnormalities of gait and mobility: Secondary | ICD-10-CM | POA: Diagnosis not present

## 2022-01-20 ENCOUNTER — Telehealth: Payer: Self-pay

## 2022-01-20 ENCOUNTER — Ambulatory Visit: Payer: Medicare HMO

## 2022-01-20 DIAGNOSIS — I10 Essential (primary) hypertension: Secondary | ICD-10-CM | POA: Diagnosis not present

## 2022-01-20 NOTE — Telephone Encounter (Signed)
PT called to check on pt as missed visit today. Reports that he had emergency surgery on his chest port last week which was why he missed end of last week and didn't have time to call then. Forgot to call this morning as felt he needed one more day to rest. Plans to be here on Thursday. ?Cherly Anderson, PT, DPT, NCS ? ?

## 2022-01-21 ENCOUNTER — Emergency Department (HOSPITAL_COMMUNITY)
Admission: EM | Admit: 2022-01-21 | Discharge: 2022-01-21 | Disposition: A | Discharge status: Home / Self Care | Payer: Medicare HMO | Attending: Emergency Medicine | Admitting: Emergency Medicine

## 2022-01-21 ENCOUNTER — Other Ambulatory Visit: Payer: Self-pay

## 2022-01-21 ENCOUNTER — Emergency Department (HOSPITAL_COMMUNITY): Payer: Medicare HMO

## 2022-01-21 DIAGNOSIS — S0101XA Laceration without foreign body of scalp, initial encounter: Secondary | ICD-10-CM | POA: Insufficient documentation

## 2022-01-21 DIAGNOSIS — E1165 Type 2 diabetes mellitus with hyperglycemia: Secondary | ICD-10-CM | POA: Diagnosis not present

## 2022-01-21 DIAGNOSIS — Y92002 Bathroom of unspecified non-institutional (private) residence single-family (private) house as the place of occurrence of the external cause: Secondary | ICD-10-CM | POA: Insufficient documentation

## 2022-01-21 DIAGNOSIS — Z794 Long term (current) use of insulin: Secondary | ICD-10-CM | POA: Insufficient documentation

## 2022-01-21 DIAGNOSIS — R739 Hyperglycemia, unspecified: Secondary | ICD-10-CM

## 2022-01-21 DIAGNOSIS — W19XXXA Unspecified fall, initial encounter: Secondary | ICD-10-CM

## 2022-01-21 DIAGNOSIS — S0990XA Unspecified injury of head, initial encounter: Secondary | ICD-10-CM | POA: Diagnosis not present

## 2022-01-21 DIAGNOSIS — Z743 Need for continuous supervision: Secondary | ICD-10-CM | POA: Diagnosis not present

## 2022-01-21 DIAGNOSIS — E1065 Type 1 diabetes mellitus with hyperglycemia: Secondary | ICD-10-CM | POA: Diagnosis not present

## 2022-01-21 DIAGNOSIS — R58 Hemorrhage, not elsewhere classified: Secondary | ICD-10-CM | POA: Diagnosis not present

## 2022-01-21 DIAGNOSIS — W01198A Fall on same level from slipping, tripping and stumbling with subsequent striking against other object, initial encounter: Secondary | ICD-10-CM | POA: Insufficient documentation

## 2022-01-21 DIAGNOSIS — I10 Essential (primary) hypertension: Secondary | ICD-10-CM | POA: Diagnosis not present

## 2022-01-21 DIAGNOSIS — Z992 Dependence on renal dialysis: Secondary | ICD-10-CM | POA: Diagnosis not present

## 2022-01-21 LAB — CBC WITH DIFFERENTIAL/PLATELET
Abs Immature Granulocytes: 0.01 10*3/uL (ref 0.00–0.07)
Basophils Absolute: 0 10*3/uL (ref 0.0–0.1)
Basophils Relative: 1 %
Eosinophils Absolute: 0.5 10*3/uL (ref 0.0–0.5)
Eosinophils Relative: 7 %
HCT: 44.6 % (ref 39.0–52.0)
Hemoglobin: 13.6 g/dL (ref 13.0–17.0)
Immature Granulocytes: 0 %
Lymphocytes Relative: 20 %
Lymphs Abs: 1.5 10*3/uL (ref 0.7–4.0)
MCH: 29.1 pg (ref 26.0–34.0)
MCHC: 30.5 g/dL (ref 30.0–36.0)
MCV: 95.5 fL (ref 80.0–100.0)
Monocytes Absolute: 0.6 10*3/uL (ref 0.1–1.0)
Monocytes Relative: 8 %
Neutro Abs: 4.6 10*3/uL (ref 1.7–7.7)
Neutrophils Relative %: 64 %
Platelets: 129 10*3/uL — ABNORMAL LOW (ref 150–400)
RBC: 4.67 MIL/uL (ref 4.22–5.81)
RDW: 14.1 % (ref 11.5–15.5)
WBC: 7.2 10*3/uL (ref 4.0–10.5)
nRBC: 0 % (ref 0.0–0.2)

## 2022-01-21 LAB — BASIC METABOLIC PANEL
Anion gap: 18 — ABNORMAL HIGH (ref 5–15)
BUN: 55 mg/dL — ABNORMAL HIGH (ref 6–20)
CO2: 20 mmol/L — ABNORMAL LOW (ref 22–32)
Calcium: 8.5 mg/dL — ABNORMAL LOW (ref 8.9–10.3)
Chloride: 92 mmol/L — ABNORMAL LOW (ref 98–111)
Creatinine, Ser: 10.03 mg/dL — ABNORMAL HIGH (ref 0.61–1.24)
GFR, Estimated: 6 mL/min — ABNORMAL LOW (ref >60)
Glucose, Bld: 436 mg/dL — ABNORMAL HIGH (ref 70–99)
Potassium: 4.4 mmol/L (ref 3.5–5.1)
Sodium: 130 mmol/L — ABNORMAL LOW (ref 135–145)

## 2022-01-21 LAB — CBG MONITORING, ED
Glucose-Capillary: 415 mg/dL — ABNORMAL HIGH (ref 70–99)
Glucose-Capillary: 335 mg/dL — ABNORMAL HIGH (ref 70–99)

## 2022-01-21 MED ORDER — LIDOCAINE-EPINEPHRINE-TETRACAINE (LET) TOPICAL GEL
3.0000 mL | Freq: Once | TOPICAL | Status: AC
  Administered 2022-01-21: 3 mL via TOPICAL
  Filled 2022-01-21: qty 3

## 2022-01-21 MED ORDER — INSULIN ASPART 100 UNIT/ML IJ SOLN
7.0000 [IU] | Freq: Once | INTRAMUSCULAR | Status: AC
  Administered 2022-01-21: 7 [IU] via INTRAVENOUS

## 2022-01-21 MED ORDER — OXYCODONE-ACETAMINOPHEN 5-325 MG PO TABS
1.0000 | ORAL_TABLET | Freq: Once | ORAL | Status: AC
Start: 2022-01-21 — End: 2022-01-21
  Administered 2022-01-21: 1 via ORAL
  Filled 2022-01-21: qty 1

## 2022-01-21 MED ORDER — HYDROMORPHONE HCL 1 MG/ML IJ SOLN
0.5000 mg | Freq: Once | INTRAMUSCULAR | Status: AC
  Administered 2022-01-21: 0.5 mg via INTRAVENOUS
  Filled 2022-01-21: qty 1

## 2022-01-21 MED ORDER — INSULIN ASPART 100 UNIT/ML IJ SOLN
7.0000 [IU] | Freq: Once | INTRAMUSCULAR | Status: AC
Start: 2022-01-21 — End: 2022-01-21
  Administered 2022-01-21: 7 [IU] via INTRAVENOUS

## 2022-01-21 MED ORDER — OXYCODONE-ACETAMINOPHEN 5-325 MG PO TABS
1.0000 | ORAL_TABLET | Freq: Once | ORAL | Status: AC
  Administered 2022-01-21: 1 via ORAL
  Filled 2022-01-21: qty 1

## 2022-01-21 NOTE — ED Triage Notes (Signed)
Via EMS. Pt had mechanical fall in bathroom hitting head on floor. Denies LOC. Pt has lac to L temporal scalp region. Bleeding controlled. Pt GCS 15. Complains of pain to L side of head, and generalized body pain. Pt has L AKA, dialysis due today.  ?

## 2022-01-21 NOTE — ED Notes (Signed)
Pt given warm blanket and PO fluids, awaiting [facility]  ?

## 2022-01-21 NOTE — ED Notes (Signed)
Given PO/fluids per MD approval, awaiting [facility] home  ?

## 2022-01-21 NOTE — Discharge Instructions (Signed)
Continue to use your insulin and follow a diabetic diet to keep your sugars controlled.  Plan on going to your dialysis appointment tomorrow at 47.  There was no sign of injury to your brain today or internal bleeding.  The stitches that were placed in your scalp will come out on their own in about a week's time.  It is okay to still take a shower and wash your hair. ?

## 2022-01-21 NOTE — ED Notes (Signed)
Gave patient a [place] sandwich patient is sitting up with call bell in reach ?

## 2022-01-21 NOTE — ED Notes (Signed)
Rocky Link (Mother) of [place] called asking for an update. She is on lunch until 1pm. Her number is [phone]. ?

## 2022-01-21 NOTE — Progress Notes (Signed)
Contacted by ED provider regarding pt needing late appt today for out-pt HD. Contacted [facility] and spoke to [place], Agricultural consultant. Clinic is unable to provide treatment today but pt can receive treatment tomorrow. Pt will need to arrive at 11:00 am. Update provided to ED provider who spoke to pt about appt. Pt agreeable to plan. Navigator also spoke to pt via phone to inquire how pt gets to HD appts (pt also reminded again about time). Pt reports he is transported by [employer]. Pt also agreeable to navigator contacting pt's mother to provide this info regarding HD appt. Spoke to pt's mother who states that pt will need to contact his transportation today regarding need for transportation tomorrow. Contacted [place] and spoke to staff who confirm pt uses [facility] for transport. Spoke to pt again via phone to remind him to contact his transportation today in regards to need for transport in the morning. Clinic also requested that pt bring insurance cards tomorrow. Pt reminded to bring insurance cards as well. Pt voices understanding. Update provided to pt's RN regarding the above regarding transportation. Pt's out-pt HD appt for tomorrow added to pt's AVS. Will assist as needed.  ? ?Melven Sartorius ?Renal Navigator ?[phone] ?

## 2022-01-21 NOTE — ED Notes (Signed)
Checked patient blood sugar it was 335 notified the RN of blood sugar patient is resting with call bell in reach  ?

## 2022-01-21 NOTE — ED Notes (Signed)
[facility] called, tenth on list ?

## 2022-01-21 NOTE — ED Notes (Signed)
ED Provider at bedside. With suture kit  ?

## 2022-01-21 NOTE — ED Provider Notes (Signed)
?[facility] ?Provider Note ? ? ?CSN: [id number] ?Arrival date & time: 01/21/22  1008 ? ?  ? ?History ? ?Chief Complaint  ?Patient presents with  ? Fall  ? ? ?[name] is a 41 y.o. male. ? 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  Patient reports that he was trying to make it to the bathroom and he was getting his pull-up off which was soiled when he slipped and fell forward hitting his left side of his head on the counter as he went down.  He is complaining of a bad headache with a lot of pain in the head but denies any loss of consciousness.  He has no vision changes.  He reports he is a little bit sore all over but the main issue is his head.  He has no neck pain.  He denies any weakness in his upper extremities.  He reports prior to this his sugar was high today at 400 but he has not taken any medication yet and he otherwise felt his normal self.  He denies any nausea vomiting, fevers or abdominal pain at this time.  No shortness of breath.  Last full course of dialysis was on Monday. ? ?The history is provided by the patient.  ?Fall ?This is a new problem. The current episode started 1 to 2 hours ago. The problem occurs constantly. The problem has not changed since onset. ? ?  ? ?Home Medications ?Prior to Admission medications   ?Medication Sig Start Date End Date Taking? Authorizing Provider  ?Blood Glucose Monitoring Suppl (ONETOUCH VERIO FLEX SYSTEM) w/Device KIT 1 Bag by Does not apply route 3 (three) times daily before meals. 05/02/20   Kerin Perna, NP  ?calcitRIOL (ROCALTROL) 0.5 MCG capsule Take 3 capsules (1.5 mcg total) by mouth every Monday, Wednesday, and Friday with hemodialysis. 05/23/21   Hosie Poisson, MD  ?gabapentin  (NEURONTIN) 100 MG capsule TAKE 1 CAPSULE BY MOUTH THREE TIMES DAILY 12/22/21   Kerin Perna, NP  ?glucose blood (ONETOUCH VERIO) test strip USE AS DIRECTED THREE TIMES DAILY 11/12/21   Kerin Perna, NP  ?insulin aspart (NOVOLOG) 100 UNIT/ML injection Inject 10 Units into the skin 3 (three) times daily before meals. For blood sugars 0-150 give 0 units of insulin, 151-200 give 2 units of insulin, 201-250 give 4 units, 251-300 give 6 units, 301-350 give 8 units, 351-400 give 10 units,> 400 give 12 units and call M.D. Discussed hypoglycemia protocol. 11/24/21   Kerin Perna, NP  ?Lancets (ONETOUCH DELICA PLUS [id number]) MISC 1 each by Other route daily. 05/02/20   [provider]  ?multivitamin (RENA-VIT) TABS tablet Take 1 tablet by mouth at bedtime. 05/23/21   Hosie Poisson, MD  ?sevelamer carbonate (RENVELA) 800 MG tablet Take 2,400-4,000 mg by mouth See admin instructions. Take 4,000 mg (5 tablets) by mouth with meals and 2,400 mg (3 tablets) with snacks 04/27/19   [provider]  ?   ? ?Allergies    ?Patient has no known allergies.   ? ?Review of Systems   ?Review of Systems ? ?Physical Exam ?Updated Vital Signs ?BP 123/71 (BP Location: Left Arm) Comment: Simultaneous filing. User may not have seen previous data. Comment (BP Location): Simultaneous filing.  User may not have seen previous data.  Pulse 77 Comment: Simultaneous filing. User may not have seen previous data.  Temp 97.8 ?F (36.6 ?C) (Oral)   Resp 16 Comment: Simultaneous filing. User may not have seen previous data.  SpO2 96% Comment: Simultaneous filing. User may not have seen previous data. ?Physical Exam ?Vitals and nursing note reviewed.  ?Constitutional:   ?   General: He is not in acute distress. ?   Appearance: He is well-developed.  ?HENT:  ?   Head: Normocephalic.  ? ?   Mouth/Throat:  ?   Mouth: Mucous membranes are moist.  ?Eyes:  ?   Extraocular Movements: Extraocular movements intact.  ?    Conjunctiva/sclera: Conjunctivae normal.  ?   Pupils: Pupils are equal, round, and reactive to light.  ?Cardiovascular:  ?   Rate and Rhythm: Normal rate and regular rhythm.  ?   Heart sounds: No murmur heard. ?Pulmonary:  ?   Effort: Pulmonary effort is normal. No respiratory distress.  ?   Breath sounds: Normal breath sounds. No wheezing or rales.  ?   Comments: PermCath present in the right upper chest ?Abdominal:  ?   General: There is no distension.  ?   Palpations: Abdomen is soft.  ?   Tenderness: There is no abdominal tenderness. There is no guarding or rebound.  ?Musculoskeletal:     ?   General: No tenderness. Normal range of motion.  ?   Cervical back: Normal range of motion and neck supple.  ?   Right lower leg: No edema.  ?   Comments: Left BKA with normal-appearing stump.  Right lower extremity without evidence of trauma.  Chronic skin changes consistent with peripheral vascular disease  ?Skin: ?   General: Skin is warm and dry.  ?   Findings: No erythema or rash.  ?Neurological:  ?   Mental Status: He is alert and oriented to person, place, and time. Mental status is at baseline.  ?   Sensory: No sensory deficit.  ?   Motor: No weakness.  ?Psychiatric:     ?   Mood and Affect: Mood normal.     ?   Behavior: Behavior normal.  ? ? ?ED Results / Procedures / Treatments   ?Labs ?(all labs ordered are listed, but only abnormal results are displayed) ?Labs Reviewed  ?CBC WITH DIFFERENTIAL/PLATELET - Abnormal; Notable for the following components:  ?    Result Value  ? Platelets 129 (*)   ? All other components within normal limits  ?BASIC METABOLIC PANEL - Abnormal; Notable for the following components:  ? Sodium 130 (*)   ? Chloride 92 (*)   ? CO2 20 (*)   ? Glucose, Bld 436 (*)   ? BUN 55 (*)   ? Creatinine, Ser 10.03 (*)   ? Calcium 8.5 (*)   ? GFR, Estimated 6 (*)   ? Anion gap 18 (*)   ? All other components within normal limits  ?CBG MONITORING, ED - Abnormal; Notable for the following components:  ?  Glucose-Capillary 415 (*)   ? All other components within normal limits  ?CBG MONITORING, ED - Abnormal; Notable for the following components:  ? Glucose-Capillary 335 (*)   ? All other components within normal limits  ? ? ?EKG ?None ? ?Radiology ?CT Head Wo Contrast ? ?Result Date: 01/21/2022 ?CLINICAL DATA:  Head trauma, moderate-severe EXAM: CT HEAD WITHOUT CONTRAST TECHNIQUE: Contiguous axial images were obtained from the base of  the skull through the vertex without intravenous contrast. RADIATION DOSE REDUCTION: This exam was performed according to the departmental dose-optimization program which includes automated exposure control, adjustment of the mA and/or kV according to patient size and/or use of iterative reconstruction technique. COMPARISON:  CT head August 08, 2021 FINDINGS: Brain: No evidence of acute infarction, hemorrhage, hydrocephalus, extra-axial collection or mass lesion/mass effect. Vascular: No hyperdense vessel identified. Skull: No acute fracture. Sinuses/Orbits: Clear sinuses.  No acute orbital findings. Other: No mastoid effusions. IMPRESSION: No evidence of acute intracranial abnormality. Electronically Signed   By: Margaretha Sheffield M.D.   On: 01/21/2022 11:24   ? ?Procedures ?Procedures  ? ? ?Medications Ordered in ED ?Medications  ?HYDROmorphone (DILAUDID) injection 0.5 mg (0.5 mg Intravenous Given 01/21/22 1033)  ?insulin aspart (novoLOG) injection 7 Units (7 Units Intravenous Given 01/21/22 1149)  ?HYDROmorphone (DILAUDID) injection 0.5 mg (0.5 mg Intravenous Given 01/21/22 1225)  ?oxyCODONE-acetaminophen (PERCOCET/ROXICET) 5-325 MG per tablet 1 tablet (1 tablet Oral Given 01/21/22 1225)  ?lidocaine-EPINEPHrine-tetracaine (LET) topical gel (3 mLs Topical Given 01/21/22 1225)  ?insulin aspart (novoLOG) injection 7 Units (7 Units Intravenous Given 01/21/22 1342)  ?insulin aspart (novoLOG) injection 7 Units (7 Units Intravenous Given 01/21/22 1503)  ?oxyCODONE-acetaminophen (PERCOCET/ROXICET)  5-325 MG per tablet 1 tablet (1 tablet Oral Given 01/21/22 1501)  ? ? ?ED Course/ Medical Decision Making/ A&P ?  ?                        ?Medical Decision Making ?Amount and/or Complexity of Data Reviewed ?Independent H

## 2022-01-21 NOTE — ED Notes (Signed)
Pt face/scalp chest cleaned from dried blood. No active bleeding noted.  ?

## 2022-01-22 ENCOUNTER — Ambulatory Visit: Payer: Medicare HMO

## 2022-01-22 DIAGNOSIS — I10 Essential (primary) hypertension: Secondary | ICD-10-CM | POA: Diagnosis not present

## 2022-01-22 DIAGNOSIS — T8249XA Other complication of vascular dialysis catheter, initial encounter: Secondary | ICD-10-CM | POA: Diagnosis not present

## 2022-01-22 DIAGNOSIS — N186 End stage renal disease: Secondary | ICD-10-CM | POA: Diagnosis not present

## 2022-01-22 DIAGNOSIS — Z992 Dependence on renal dialysis: Secondary | ICD-10-CM | POA: Diagnosis not present

## 2022-01-22 DIAGNOSIS — R531 Weakness: Secondary | ICD-10-CM | POA: Diagnosis not present

## 2022-01-22 DIAGNOSIS — D689 Coagulation defect, unspecified: Secondary | ICD-10-CM | POA: Diagnosis not present

## 2022-01-22 DIAGNOSIS — Z743 Need for continuous supervision: Secondary | ICD-10-CM | POA: Diagnosis not present

## 2022-01-22 DIAGNOSIS — N2581 Secondary hyperparathyroidism of renal origin: Secondary | ICD-10-CM | POA: Diagnosis not present

## 2022-01-23 DIAGNOSIS — R5383 Other fatigue: Secondary | ICD-10-CM | POA: Diagnosis not present

## 2022-01-23 DIAGNOSIS — R531 Weakness: Secondary | ICD-10-CM | POA: Diagnosis not present

## 2022-01-23 DIAGNOSIS — G8929 Other chronic pain: Secondary | ICD-10-CM | POA: Diagnosis not present

## 2022-01-23 DIAGNOSIS — F29 Unspecified psychosis not due to a substance or known physiological condition: Secondary | ICD-10-CM | POA: Diagnosis not present

## 2022-01-23 DIAGNOSIS — R69 Illness, unspecified: Secondary | ICD-10-CM | POA: Diagnosis not present

## 2022-01-23 DIAGNOSIS — N2581 Secondary hyperparathyroidism of renal origin: Secondary | ICD-10-CM | POA: Diagnosis not present

## 2022-01-23 DIAGNOSIS — Z992 Dependence on renal dialysis: Secondary | ICD-10-CM | POA: Diagnosis not present

## 2022-01-23 DIAGNOSIS — Z743 Need for continuous supervision: Secondary | ICD-10-CM | POA: Diagnosis not present

## 2022-01-23 DIAGNOSIS — D689 Coagulation defect, unspecified: Secondary | ICD-10-CM | POA: Diagnosis not present

## 2022-01-23 DIAGNOSIS — I10 Essential (primary) hypertension: Secondary | ICD-10-CM | POA: Diagnosis not present

## 2022-01-23 DIAGNOSIS — T8249XA Other complication of vascular dialysis catheter, initial encounter: Secondary | ICD-10-CM | POA: Diagnosis not present

## 2022-01-23 DIAGNOSIS — N186 End stage renal disease: Secondary | ICD-10-CM | POA: Diagnosis not present

## 2022-01-24 DIAGNOSIS — I10 Essential (primary) hypertension: Secondary | ICD-10-CM | POA: Diagnosis not present

## 2022-01-25 DIAGNOSIS — I10 Essential (primary) hypertension: Secondary | ICD-10-CM | POA: Diagnosis not present

## 2022-01-25 DIAGNOSIS — Z992 Dependence on renal dialysis: Secondary | ICD-10-CM | POA: Diagnosis not present

## 2022-01-25 DIAGNOSIS — I129 Hypertensive chronic kidney disease with stage 1 through stage 4 chronic kidney disease, or unspecified chronic kidney disease: Secondary | ICD-10-CM | POA: Diagnosis not present

## 2022-01-25 DIAGNOSIS — N186 End stage renal disease: Secondary | ICD-10-CM | POA: Diagnosis not present

## 2022-01-26 DIAGNOSIS — I10 Essential (primary) hypertension: Secondary | ICD-10-CM | POA: Diagnosis not present

## 2022-01-26 DIAGNOSIS — T8249XA Other complication of vascular dialysis catheter, initial encounter: Secondary | ICD-10-CM | POA: Diagnosis not present

## 2022-01-26 DIAGNOSIS — Z992 Dependence on renal dialysis: Secondary | ICD-10-CM | POA: Diagnosis not present

## 2022-01-26 DIAGNOSIS — N186 End stage renal disease: Secondary | ICD-10-CM | POA: Diagnosis not present

## 2022-01-26 DIAGNOSIS — D689 Coagulation defect, unspecified: Secondary | ICD-10-CM | POA: Diagnosis not present

## 2022-01-26 DIAGNOSIS — R531 Weakness: Secondary | ICD-10-CM | POA: Diagnosis not present

## 2022-01-26 DIAGNOSIS — N2581 Secondary hyperparathyroidism of renal origin: Secondary | ICD-10-CM | POA: Diagnosis not present

## 2022-01-26 DIAGNOSIS — Z743 Need for continuous supervision: Secondary | ICD-10-CM | POA: Diagnosis not present

## 2022-01-27 ENCOUNTER — Ambulatory Visit: Payer: Medicare HMO

## 2022-01-27 DIAGNOSIS — I10 Essential (primary) hypertension: Secondary | ICD-10-CM | POA: Diagnosis not present

## 2022-01-28 DIAGNOSIS — R531 Weakness: Secondary | ICD-10-CM | POA: Diagnosis not present

## 2022-01-28 DIAGNOSIS — D689 Coagulation defect, unspecified: Secondary | ICD-10-CM | POA: Diagnosis not present

## 2022-01-28 DIAGNOSIS — Z992 Dependence on renal dialysis: Secondary | ICD-10-CM | POA: Diagnosis not present

## 2022-01-28 DIAGNOSIS — N2581 Secondary hyperparathyroidism of renal origin: Secondary | ICD-10-CM | POA: Diagnosis not present

## 2022-01-28 DIAGNOSIS — I10 Essential (primary) hypertension: Secondary | ICD-10-CM | POA: Diagnosis not present

## 2022-01-28 DIAGNOSIS — N186 End stage renal disease: Secondary | ICD-10-CM | POA: Diagnosis not present

## 2022-01-28 DIAGNOSIS — T8249XA Other complication of vascular dialysis catheter, initial encounter: Secondary | ICD-10-CM | POA: Diagnosis not present

## 2022-01-28 DIAGNOSIS — Z743 Need for continuous supervision: Secondary | ICD-10-CM | POA: Diagnosis not present

## 2022-01-29 ENCOUNTER — Telehealth: Payer: Self-pay | Admitting: Rehabilitation

## 2022-01-29 ENCOUNTER — Ambulatory Visit: Payer: Medicare HMO | Attending: Primary Care | Admitting: Rehabilitation

## 2022-01-29 DIAGNOSIS — I10 Essential (primary) hypertension: Secondary | ICD-10-CM | POA: Diagnosis not present

## 2022-01-29 NOTE — Telephone Encounter (Signed)
Pt had appt scheduled for 01/29/22 and did not come to this appt nor did he cancel, so PT called and spoke with him.  He reports he is still feeling shaky from his fall last week.  He is concerned about falling again.  Educated on importance of keeping therapy appts to address fall concerns.  Pt reports he will be at appt on Tuesday coming up and will be ready to set more appts.   ? ?Cameron Sprang, PT, MPT ?[place] ?[place], [place], [address] ?Phone: [phone]   Fax:  [phone] ?01/29/22, 9:58 AM ? ?

## 2022-01-30 ENCOUNTER — Ambulatory Visit (HOSPITAL_COMMUNITY)
Admission: RE | Admit: 2022-01-30 | Discharge: 2022-01-30 | Disposition: A | Discharge status: Home / Self Care | Payer: Medicare HMO | Source: Ambulatory Visit | Attending: Nephrology | Admitting: Nephrology

## 2022-01-30 ENCOUNTER — Other Ambulatory Visit (HOSPITAL_COMMUNITY): Payer: Self-pay | Admitting: Nephrology

## 2022-01-30 ENCOUNTER — Other Ambulatory Visit (HOSPITAL_COMMUNITY): Payer: Self-pay | Admitting: Radiology

## 2022-01-30 DIAGNOSIS — N186 End stage renal disease: Secondary | ICD-10-CM

## 2022-01-30 DIAGNOSIS — T82828A Fibrosis of vascular prosthetic devices, implants and grafts, initial encounter: Secondary | ICD-10-CM | POA: Diagnosis not present

## 2022-01-30 DIAGNOSIS — Z4901 Encounter for fitting and adjustment of extracorporeal dialysis catheter: Secondary | ICD-10-CM | POA: Diagnosis not present

## 2022-01-30 DIAGNOSIS — Z743 Need for continuous supervision: Secondary | ICD-10-CM | POA: Diagnosis not present

## 2022-01-30 DIAGNOSIS — T83198A Other mechanical complication of other urinary devices and implants, initial encounter: Secondary | ICD-10-CM | POA: Diagnosis not present

## 2022-01-30 DIAGNOSIS — R29898 Other symptoms and signs involving the musculoskeletal system: Secondary | ICD-10-CM | POA: Diagnosis not present

## 2022-01-30 DIAGNOSIS — R531 Weakness: Secondary | ICD-10-CM | POA: Diagnosis not present

## 2022-01-30 DIAGNOSIS — I10 Essential (primary) hypertension: Secondary | ICD-10-CM | POA: Diagnosis not present

## 2022-01-30 DIAGNOSIS — Z992 Dependence on renal dialysis: Secondary | ICD-10-CM

## 2022-01-30 HISTORY — PX: IR VENOCAVAGRAM SVC: IMG679

## 2022-01-30 HISTORY — PX: IR FLUORO GUIDE CV LINE RIGHT: IMG2283

## 2022-01-30 HISTORY — PX: IR PTA VENOUS EXCEPT DIALYSIS CIRCUIT: IMG6126

## 2022-01-30 MED ORDER — CHLORHEXIDINE GLUCONATE 4 % EX LIQD
CUTANEOUS | Status: AC
  Filled 2022-01-30: qty 15

## 2022-01-30 MED ORDER — HEPARIN SODIUM (PORCINE) 1000 UNIT/ML IJ SOLN
INTRAMUSCULAR | Status: AC
  Administered 2022-01-30: 2.12 mL
  Filled 2022-01-30: qty 10

## 2022-01-30 MED ORDER — IOHEXOL 300 MG/ML  SOLN: 100.0000 mL | Freq: Once | INTRAMUSCULAR | Status: DC | PRN

## 2022-01-30 MED ORDER — LIDOCAINE HCL 1 % IJ SOLN
INTRAMUSCULAR | Status: AC
  Administered 2022-01-30: 10 mL
  Filled 2022-01-30: qty 20

## 2022-01-30 NOTE — Procedures (Signed)
?  Procedure:  Exchange of R IJ tunneled HD CVC, Venous PTA of SVC fibrin sheath  ?Preprocedure diagnosis: The encounter diagnosis was ESRD (end stage renal disease) ([place]). ? ?Postprocedure diagnosis: same ?EBL:    minimal ?Complications:   none immediate ? ?See full dictation in [facility]. ? ?D. Arne Cleveland MD ?Main # [phone] ?Pager  [phone] ?Mobile [phone] ?  ? ?

## 2022-01-31 ENCOUNTER — Other Ambulatory Visit (INDEPENDENT_AMBULATORY_CARE_PROVIDER_SITE_OTHER): Payer: Self-pay | Admitting: Primary Care

## 2022-01-31 DIAGNOSIS — T8249XA Other complication of vascular dialysis catheter, initial encounter: Secondary | ICD-10-CM | POA: Diagnosis not present

## 2022-01-31 DIAGNOSIS — Z992 Dependence on renal dialysis: Secondary | ICD-10-CM | POA: Diagnosis not present

## 2022-01-31 DIAGNOSIS — N186 End stage renal disease: Secondary | ICD-10-CM | POA: Diagnosis not present

## 2022-01-31 DIAGNOSIS — D689 Coagulation defect, unspecified: Secondary | ICD-10-CM | POA: Diagnosis not present

## 2022-01-31 DIAGNOSIS — I10 Essential (primary) hypertension: Secondary | ICD-10-CM | POA: Diagnosis not present

## 2022-01-31 DIAGNOSIS — N2581 Secondary hyperparathyroidism of renal origin: Secondary | ICD-10-CM | POA: Diagnosis not present

## 2022-01-31 DIAGNOSIS — Z743 Need for continuous supervision: Secondary | ICD-10-CM | POA: Diagnosis not present

## 2022-01-31 DIAGNOSIS — G8929 Other chronic pain: Secondary | ICD-10-CM | POA: Diagnosis not present

## 2022-02-01 DIAGNOSIS — I10 Essential (primary) hypertension: Secondary | ICD-10-CM | POA: Diagnosis not present

## 2022-02-02 DIAGNOSIS — D689 Coagulation defect, unspecified: Secondary | ICD-10-CM | POA: Diagnosis not present

## 2022-02-02 DIAGNOSIS — N2581 Secondary hyperparathyroidism of renal origin: Secondary | ICD-10-CM | POA: Diagnosis not present

## 2022-02-02 DIAGNOSIS — T8249XA Other complication of vascular dialysis catheter, initial encounter: Secondary | ICD-10-CM | POA: Diagnosis not present

## 2022-02-02 DIAGNOSIS — I10 Essential (primary) hypertension: Secondary | ICD-10-CM | POA: Diagnosis not present

## 2022-02-02 DIAGNOSIS — N186 End stage renal disease: Secondary | ICD-10-CM | POA: Diagnosis not present

## 2022-02-02 DIAGNOSIS — Z743 Need for continuous supervision: Secondary | ICD-10-CM | POA: Diagnosis not present

## 2022-02-02 DIAGNOSIS — R531 Weakness: Secondary | ICD-10-CM | POA: Diagnosis not present

## 2022-02-02 DIAGNOSIS — Z992 Dependence on renal dialysis: Secondary | ICD-10-CM | POA: Diagnosis not present

## 2022-02-02 NOTE — Telephone Encounter (Signed)
Sent to PCP ?

## 2022-02-03 ENCOUNTER — Encounter: Payer: Medicare HMO | Admitting: Rehabilitation

## 2022-02-03 ENCOUNTER — Ambulatory Visit: Payer: Medicare HMO | Admitting: Rehabilitation

## 2022-02-03 DIAGNOSIS — I10 Essential (primary) hypertension: Secondary | ICD-10-CM | POA: Diagnosis not present

## 2022-02-04 DIAGNOSIS — Z743 Need for continuous supervision: Secondary | ICD-10-CM | POA: Diagnosis not present

## 2022-02-04 DIAGNOSIS — D689 Coagulation defect, unspecified: Secondary | ICD-10-CM | POA: Diagnosis not present

## 2022-02-04 DIAGNOSIS — N2581 Secondary hyperparathyroidism of renal origin: Secondary | ICD-10-CM | POA: Diagnosis not present

## 2022-02-04 DIAGNOSIS — T8249XA Other complication of vascular dialysis catheter, initial encounter: Secondary | ICD-10-CM | POA: Diagnosis not present

## 2022-02-04 DIAGNOSIS — R531 Weakness: Secondary | ICD-10-CM | POA: Diagnosis not present

## 2022-02-04 DIAGNOSIS — I10 Essential (primary) hypertension: Secondary | ICD-10-CM | POA: Diagnosis not present

## 2022-02-04 DIAGNOSIS — Z992 Dependence on renal dialysis: Secondary | ICD-10-CM | POA: Diagnosis not present

## 2022-02-04 DIAGNOSIS — N186 End stage renal disease: Secondary | ICD-10-CM | POA: Diagnosis not present

## 2022-02-05 ENCOUNTER — Encounter: Payer: Medicare HMO | Admitting: Physical Therapy

## 2022-02-05 DIAGNOSIS — I10 Essential (primary) hypertension: Secondary | ICD-10-CM | POA: Diagnosis not present

## 2022-02-06 DIAGNOSIS — I10 Essential (primary) hypertension: Secondary | ICD-10-CM | POA: Diagnosis not present

## 2022-02-06 DIAGNOSIS — Z992 Dependence on renal dialysis: Secondary | ICD-10-CM | POA: Diagnosis not present

## 2022-02-06 DIAGNOSIS — R531 Weakness: Secondary | ICD-10-CM | POA: Diagnosis not present

## 2022-02-06 DIAGNOSIS — D689 Coagulation defect, unspecified: Secondary | ICD-10-CM | POA: Diagnosis not present

## 2022-02-06 DIAGNOSIS — Z743 Need for continuous supervision: Secondary | ICD-10-CM | POA: Diagnosis not present

## 2022-02-06 DIAGNOSIS — N2581 Secondary hyperparathyroidism of renal origin: Secondary | ICD-10-CM | POA: Diagnosis not present

## 2022-02-06 DIAGNOSIS — N186 End stage renal disease: Secondary | ICD-10-CM | POA: Diagnosis not present

## 2022-02-06 DIAGNOSIS — T8249XA Other complication of vascular dialysis catheter, initial encounter: Secondary | ICD-10-CM | POA: Diagnosis not present

## 2022-02-09 DIAGNOSIS — D689 Coagulation defect, unspecified: Secondary | ICD-10-CM | POA: Diagnosis not present

## 2022-02-09 DIAGNOSIS — T8249XA Other complication of vascular dialysis catheter, initial encounter: Secondary | ICD-10-CM | POA: Diagnosis not present

## 2022-02-09 DIAGNOSIS — Z992 Dependence on renal dialysis: Secondary | ICD-10-CM | POA: Diagnosis not present

## 2022-02-09 DIAGNOSIS — N2581 Secondary hyperparathyroidism of renal origin: Secondary | ICD-10-CM | POA: Diagnosis not present

## 2022-02-09 DIAGNOSIS — N186 End stage renal disease: Secondary | ICD-10-CM | POA: Diagnosis not present

## 2022-02-09 DIAGNOSIS — I10 Essential (primary) hypertension: Secondary | ICD-10-CM | POA: Diagnosis not present

## 2022-02-09 DIAGNOSIS — R531 Weakness: Secondary | ICD-10-CM | POA: Diagnosis not present

## 2022-02-09 DIAGNOSIS — Z743 Need for continuous supervision: Secondary | ICD-10-CM | POA: Diagnosis not present

## 2022-02-10 DIAGNOSIS — I10 Essential (primary) hypertension: Secondary | ICD-10-CM | POA: Diagnosis not present

## 2022-02-11 DIAGNOSIS — Z743 Need for continuous supervision: Secondary | ICD-10-CM | POA: Diagnosis not present

## 2022-02-11 DIAGNOSIS — Z992 Dependence on renal dialysis: Secondary | ICD-10-CM | POA: Diagnosis not present

## 2022-02-11 DIAGNOSIS — I10 Essential (primary) hypertension: Secondary | ICD-10-CM | POA: Diagnosis not present

## 2022-02-11 DIAGNOSIS — N2581 Secondary hyperparathyroidism of renal origin: Secondary | ICD-10-CM | POA: Diagnosis not present

## 2022-02-11 DIAGNOSIS — D689 Coagulation defect, unspecified: Secondary | ICD-10-CM | POA: Diagnosis not present

## 2022-02-11 DIAGNOSIS — T8249XA Other complication of vascular dialysis catheter, initial encounter: Secondary | ICD-10-CM | POA: Diagnosis not present

## 2022-02-11 DIAGNOSIS — N186 End stage renal disease: Secondary | ICD-10-CM | POA: Diagnosis not present

## 2022-02-11 DIAGNOSIS — R531 Weakness: Secondary | ICD-10-CM | POA: Diagnosis not present

## 2022-02-12 DIAGNOSIS — I10 Essential (primary) hypertension: Secondary | ICD-10-CM | POA: Diagnosis not present

## 2022-02-13 DIAGNOSIS — R531 Weakness: Secondary | ICD-10-CM | POA: Diagnosis not present

## 2022-02-13 DIAGNOSIS — R2689 Other abnormalities of gait and mobility: Secondary | ICD-10-CM | POA: Diagnosis not present

## 2022-02-13 DIAGNOSIS — Z992 Dependence on renal dialysis: Secondary | ICD-10-CM | POA: Diagnosis not present

## 2022-02-13 DIAGNOSIS — W19XXXA Unspecified fall, initial encounter: Secondary | ICD-10-CM | POA: Diagnosis not present

## 2022-02-13 DIAGNOSIS — Z743 Need for continuous supervision: Secondary | ICD-10-CM | POA: Diagnosis not present

## 2022-02-13 DIAGNOSIS — D689 Coagulation defect, unspecified: Secondary | ICD-10-CM | POA: Diagnosis not present

## 2022-02-13 DIAGNOSIS — I10 Essential (primary) hypertension: Secondary | ICD-10-CM | POA: Diagnosis not present

## 2022-02-13 DIAGNOSIS — N186 End stage renal disease: Secondary | ICD-10-CM | POA: Diagnosis not present

## 2022-02-13 DIAGNOSIS — N2581 Secondary hyperparathyroidism of renal origin: Secondary | ICD-10-CM | POA: Diagnosis not present

## 2022-02-13 DIAGNOSIS — T8249XA Other complication of vascular dialysis catheter, initial encounter: Secondary | ICD-10-CM | POA: Diagnosis not present

## 2022-02-14 DIAGNOSIS — I10 Essential (primary) hypertension: Secondary | ICD-10-CM | POA: Diagnosis not present

## 2022-02-15 DIAGNOSIS — I10 Essential (primary) hypertension: Secondary | ICD-10-CM | POA: Diagnosis not present

## 2022-02-16 DIAGNOSIS — R531 Weakness: Secondary | ICD-10-CM | POA: Diagnosis not present

## 2022-02-16 DIAGNOSIS — Z743 Need for continuous supervision: Secondary | ICD-10-CM | POA: Diagnosis not present

## 2022-02-16 DIAGNOSIS — T8249XA Other complication of vascular dialysis catheter, initial encounter: Secondary | ICD-10-CM | POA: Diagnosis not present

## 2022-02-16 DIAGNOSIS — N186 End stage renal disease: Secondary | ICD-10-CM | POA: Diagnosis not present

## 2022-02-16 DIAGNOSIS — N2581 Secondary hyperparathyroidism of renal origin: Secondary | ICD-10-CM | POA: Diagnosis not present

## 2022-02-16 DIAGNOSIS — Z992 Dependence on renal dialysis: Secondary | ICD-10-CM | POA: Diagnosis not present

## 2022-02-16 DIAGNOSIS — I10 Essential (primary) hypertension: Secondary | ICD-10-CM | POA: Diagnosis not present

## 2022-02-16 DIAGNOSIS — D689 Coagulation defect, unspecified: Secondary | ICD-10-CM | POA: Diagnosis not present

## 2022-02-17 DIAGNOSIS — I10 Essential (primary) hypertension: Secondary | ICD-10-CM | POA: Diagnosis not present

## 2022-02-18 ENCOUNTER — Other Ambulatory Visit (INDEPENDENT_AMBULATORY_CARE_PROVIDER_SITE_OTHER): Payer: Self-pay | Admitting: Primary Care

## 2022-02-18 DIAGNOSIS — E1022 Type 1 diabetes mellitus with diabetic chronic kidney disease: Secondary | ICD-10-CM

## 2022-02-18 DIAGNOSIS — I10 Essential (primary) hypertension: Secondary | ICD-10-CM | POA: Diagnosis not present

## 2022-02-19 DIAGNOSIS — I10 Essential (primary) hypertension: Secondary | ICD-10-CM | POA: Diagnosis not present

## 2022-02-20 ENCOUNTER — Telehealth (INDEPENDENT_AMBULATORY_CARE_PROVIDER_SITE_OTHER): Payer: Self-pay | Admitting: Primary Care

## 2022-02-20 DIAGNOSIS — I10 Essential (primary) hypertension: Secondary | ICD-10-CM | POA: Diagnosis not present

## 2022-02-20 DIAGNOSIS — D689 Coagulation defect, unspecified: Secondary | ICD-10-CM | POA: Diagnosis not present

## 2022-02-20 DIAGNOSIS — N2581 Secondary hyperparathyroidism of renal origin: Secondary | ICD-10-CM | POA: Diagnosis not present

## 2022-02-20 DIAGNOSIS — Z992 Dependence on renal dialysis: Secondary | ICD-10-CM | POA: Diagnosis not present

## 2022-02-20 DIAGNOSIS — T8249XA Other complication of vascular dialysis catheter, initial encounter: Secondary | ICD-10-CM | POA: Diagnosis not present

## 2022-02-20 DIAGNOSIS — N186 End stage renal disease: Secondary | ICD-10-CM | POA: Diagnosis not present

## 2022-02-20 DIAGNOSIS — E118 Type 2 diabetes mellitus with unspecified complications: Secondary | ICD-10-CM

## 2022-02-20 NOTE — Telephone Encounter (Signed)
Requested medication (s) are due for refill today: yes  Requested medication (s) are on the active medication list: yes  Last refill:  11/24/21 #72m/ 3  Future visit scheduled: no  Notes to clinic:  Unable to refill per protocol due to failed labs, no updated results.    Requested Prescriptions  Pending Prescriptions Disp Refills   insulin aspart (NOVOLOG) 100 UNIT/ML injection 10 mL 3    Sig: Inject 10 Units into the skin 3 (three) times daily before meals. For blood sugars 0-150 give 0 units of insulin, 151-200 give 2 units of insulin, 201-250 give 4 units, 251-300 give 6 units, 301-350 give 8 units, 351-400 give 10 units,> 400 give 12 units and call M.D. Discussed hypoglycemia protocol.     Endocrinology:  Diabetes - Insulins Failed - 02/20/2022  1:13 PM      Failed - HBA1C is between 0 and 7.9 and within 180 days    Hemoglobin A1C  Date Value Ref Range Status  08/22/2014 5.8 4.2 - 6.3 % Final    Comment:    The American Diabetes Association recommends that a primary goal of therapy should be <7% and that physicians should reevaluate the treatment regimen in patients with HbA1c values consistently >8%.    Hgb A1c MFr Bld  Date Value Ref Range Status  08/09/2021 9.3 (H) 4.8 - 5.6 % Final    Comment:    (NOTE) Pre diabetes:          5.7%-6.4%  Diabetes:              >6.4%  Glycemic control for   <7.0% adults with diabetes          Failed - Valid encounter within last 6 months    Recent Outpatient Visits           11 months ago Hx of above knee amputation, left ([place]   [facility]   1 year ago Type 1 diabetes mellitus with other circulatory complication ([place]   [facility]   2 years ago Type 1 diabetes mellitus with other circulatory complication ([place]   [facility]   2 years ago Need for Tdap vaccination   [place] [place]

## 2022-02-20 NOTE — Telephone Encounter (Signed)
Medication Refill - Medication:  insulin aspart (NOVOLOG) 100 UNIT/ML injection   Has the patient contacted their pharmacy? Yes.   Contact PCP  Preferred Pharmacy (with phone number or street name):  [place], [address]  [place], [place] [address]  Phone:  [phone]  Fax:  [phone]   Has the patient been seen for an appointment in the last year OR does the patient have an upcoming appointment? Yes.    Agent: Please be advised that RX refills may take up to 3 business days. We ask that you follow-up with your pharmacy.

## 2022-02-21 DIAGNOSIS — I10 Essential (primary) hypertension: Secondary | ICD-10-CM | POA: Diagnosis not present

## 2022-02-22 DIAGNOSIS — I10 Essential (primary) hypertension: Secondary | ICD-10-CM | POA: Diagnosis not present

## 2022-02-23 DIAGNOSIS — Z992 Dependence on renal dialysis: Secondary | ICD-10-CM | POA: Diagnosis not present

## 2022-02-23 DIAGNOSIS — T8249XA Other complication of vascular dialysis catheter, initial encounter: Secondary | ICD-10-CM | POA: Diagnosis not present

## 2022-02-23 DIAGNOSIS — D689 Coagulation defect, unspecified: Secondary | ICD-10-CM | POA: Diagnosis not present

## 2022-02-23 DIAGNOSIS — N2581 Secondary hyperparathyroidism of renal origin: Secondary | ICD-10-CM | POA: Diagnosis not present

## 2022-02-23 DIAGNOSIS — N186 End stage renal disease: Secondary | ICD-10-CM | POA: Diagnosis not present

## 2022-02-23 DIAGNOSIS — I10 Essential (primary) hypertension: Secondary | ICD-10-CM | POA: Diagnosis not present

## 2022-02-24 ENCOUNTER — Other Ambulatory Visit (INDEPENDENT_AMBULATORY_CARE_PROVIDER_SITE_OTHER): Payer: Self-pay | Admitting: Primary Care

## 2022-02-24 ENCOUNTER — Other Ambulatory Visit (INDEPENDENT_AMBULATORY_CARE_PROVIDER_SITE_OTHER): Payer: Self-pay

## 2022-02-24 ENCOUNTER — Telehealth (INDEPENDENT_AMBULATORY_CARE_PROVIDER_SITE_OTHER): Payer: Medicare HMO | Admitting: Primary Care

## 2022-02-24 DIAGNOSIS — I10 Essential (primary) hypertension: Secondary | ICD-10-CM | POA: Diagnosis not present

## 2022-02-24 DIAGNOSIS — E118 Type 2 diabetes mellitus with unspecified complications: Secondary | ICD-10-CM

## 2022-02-24 MED ORDER — INSULIN ASPART 100 UNIT/ML IJ SOLN: 10.0000 [IU] | Freq: Three times a day (TID) | INTRAMUSCULAR | 3 refills | Status: DC

## 2022-02-24 NOTE — Telephone Encounter (Signed)
Spoke with patient medication sent and reminded to make an appt for A1C

## 2022-02-24 NOTE — Telephone Encounter (Signed)
Routed to PCP 

## 2022-02-24 NOTE — Telephone Encounter (Signed)
Pt following up on refill request.  Pt is upset he had not heard anything.  Pt just had his leg amputated.  Pt will be ready for appt at 4:10.

## 2022-02-25 DIAGNOSIS — I129 Hypertensive chronic kidney disease with stage 1 through stage 4 chronic kidney disease, or unspecified chronic kidney disease: Secondary | ICD-10-CM | POA: Diagnosis not present

## 2022-02-25 DIAGNOSIS — I10 Essential (primary) hypertension: Secondary | ICD-10-CM | POA: Diagnosis not present

## 2022-02-25 DIAGNOSIS — N186 End stage renal disease: Secondary | ICD-10-CM | POA: Diagnosis not present

## 2022-02-25 DIAGNOSIS — Z992 Dependence on renal dialysis: Secondary | ICD-10-CM | POA: Diagnosis not present

## 2022-02-26 DIAGNOSIS — I10 Essential (primary) hypertension: Secondary | ICD-10-CM | POA: Diagnosis not present

## 2022-02-27 DIAGNOSIS — I10 Essential (primary) hypertension: Secondary | ICD-10-CM | POA: Diagnosis not present

## 2022-02-27 DIAGNOSIS — N186 End stage renal disease: Secondary | ICD-10-CM | POA: Diagnosis not present

## 2022-02-27 DIAGNOSIS — N2581 Secondary hyperparathyroidism of renal origin: Secondary | ICD-10-CM | POA: Diagnosis not present

## 2022-02-27 DIAGNOSIS — Z992 Dependence on renal dialysis: Secondary | ICD-10-CM | POA: Diagnosis not present

## 2022-02-27 DIAGNOSIS — D689 Coagulation defect, unspecified: Secondary | ICD-10-CM | POA: Diagnosis not present

## 2022-02-27 DIAGNOSIS — T8249XA Other complication of vascular dialysis catheter, initial encounter: Secondary | ICD-10-CM | POA: Diagnosis not present

## 2022-02-28 DIAGNOSIS — I10 Essential (primary) hypertension: Secondary | ICD-10-CM | POA: Diagnosis not present

## 2022-03-01 DIAGNOSIS — I10 Essential (primary) hypertension: Secondary | ICD-10-CM | POA: Diagnosis not present

## 2022-03-02 DIAGNOSIS — T8249XA Other complication of vascular dialysis catheter, initial encounter: Secondary | ICD-10-CM | POA: Diagnosis not present

## 2022-03-02 DIAGNOSIS — N2581 Secondary hyperparathyroidism of renal origin: Secondary | ICD-10-CM | POA: Diagnosis not present

## 2022-03-02 DIAGNOSIS — I10 Essential (primary) hypertension: Secondary | ICD-10-CM | POA: Diagnosis not present

## 2022-03-02 DIAGNOSIS — D689 Coagulation defect, unspecified: Secondary | ICD-10-CM | POA: Diagnosis not present

## 2022-03-02 DIAGNOSIS — N186 End stage renal disease: Secondary | ICD-10-CM | POA: Diagnosis not present

## 2022-03-02 DIAGNOSIS — Z992 Dependence on renal dialysis: Secondary | ICD-10-CM | POA: Diagnosis not present

## 2022-03-03 ENCOUNTER — Other Ambulatory Visit (INDEPENDENT_AMBULATORY_CARE_PROVIDER_SITE_OTHER): Payer: Self-pay | Admitting: Primary Care

## 2022-03-03 DIAGNOSIS — I10 Essential (primary) hypertension: Secondary | ICD-10-CM | POA: Diagnosis not present

## 2022-03-03 NOTE — Telephone Encounter (Signed)
Requested medications are due for refill today.  yes  Requested medications are on the active medications list.  yes  Last refill. 02/02/2022 #90 0 refills  Future visit scheduled.   no  Notes to clinic.  Medication refill failed protocol d/t abnormal labs.    Requested Prescriptions  Pending Prescriptions Disp Refills   gabapentin (NEURONTIN) 100 MG capsule 90 capsule 0    Sig: Take 1 capsule (100 mg total) by mouth 3 (three) times daily.     Neurology: Anticonvulsants - gabapentin Failed - 03/03/2022  4:02 PM      Failed - Cr in normal range and within 360 days    Creatinine  Date Value Ref Range Status  08/22/2014 5.00 (H) 0.60 - 1.30 mg/dL Final   Creatinine, Ser  Date Value Ref Range Status  01/21/2022 10.03 (H) 0.61 - 1.24 mg/dL Final         Passed - Completed PHQ-2 or PHQ-9 in the last 360 days      Passed - Valid encounter within last 12 months    Recent Outpatient Visits           11 months ago Hx of above knee amputation, left ([place])   [place] [facility] Juluis Mire P, NP   1 year ago Type 1 diabetes mellitus with other circulatory complication ([place])   [place] Kerin Perna, NP   2 years ago Type 1 diabetes mellitus with other circulatory complication ([place])   [place] Kerin Perna, NP   2 years ago Need for Tdap vaccination   [place] Kerin Perna, NP

## 2022-03-03 NOTE — Telephone Encounter (Signed)
gabapentin (NEURONTIN) 100 MG capsule 90 capsule 0 02/02/2022    Sig: TAKE 1 CAPSULE BY MOUTH THREE TIMES DAILY   Sent to pharmacy as: gabapentin (NEURONTIN) 100 MG capsule   Medication Refill - Medication:   Has the patient contacted their pharmacy? Yes.   (Agent: If no, request that the patient contact the pharmacy for the refill. If patient does not wish to contact the pharmacy document the reason why and proceed with request.) (Agent: If yes, when and what did the pharmacy advise?)call dr  Preferred Pharmacy (with phone number or street name):  [place], [place] [address]  Phone: [phone] Fax: [phone]   Has the patient been seen for an appointment in the last year OR does the patient have an upcoming appointment? No. 03/27/21  Agent: Please be advised that RX refills may take up to 3 business days. We ask that you follow-up with your pharmacy.

## 2022-03-04 DIAGNOSIS — T8249XA Other complication of vascular dialysis catheter, initial encounter: Secondary | ICD-10-CM | POA: Diagnosis not present

## 2022-03-04 DIAGNOSIS — N2581 Secondary hyperparathyroidism of renal origin: Secondary | ICD-10-CM | POA: Diagnosis not present

## 2022-03-04 DIAGNOSIS — N186 End stage renal disease: Secondary | ICD-10-CM | POA: Diagnosis not present

## 2022-03-04 DIAGNOSIS — Z992 Dependence on renal dialysis: Secondary | ICD-10-CM | POA: Diagnosis not present

## 2022-03-04 DIAGNOSIS — I10 Essential (primary) hypertension: Secondary | ICD-10-CM | POA: Diagnosis not present

## 2022-03-04 DIAGNOSIS — D689 Coagulation defect, unspecified: Secondary | ICD-10-CM | POA: Diagnosis not present

## 2022-03-04 MED ORDER — GABAPENTIN 100 MG PO CAPS: 100.0000 mg | ORAL_CAPSULE | Freq: Three times a day (TID) | ORAL | 2 refills | Status: DC

## 2022-03-04 NOTE — Telephone Encounter (Signed)
Routed to PCP 

## 2022-03-05 DIAGNOSIS — I10 Essential (primary) hypertension: Secondary | ICD-10-CM | POA: Diagnosis not present

## 2022-03-06 DIAGNOSIS — D689 Coagulation defect, unspecified: Secondary | ICD-10-CM | POA: Diagnosis not present

## 2022-03-06 DIAGNOSIS — N2581 Secondary hyperparathyroidism of renal origin: Secondary | ICD-10-CM | POA: Diagnosis not present

## 2022-03-06 DIAGNOSIS — I10 Essential (primary) hypertension: Secondary | ICD-10-CM | POA: Diagnosis not present

## 2022-03-06 DIAGNOSIS — Z992 Dependence on renal dialysis: Secondary | ICD-10-CM | POA: Diagnosis not present

## 2022-03-06 DIAGNOSIS — T8249XA Other complication of vascular dialysis catheter, initial encounter: Secondary | ICD-10-CM | POA: Diagnosis not present

## 2022-03-06 DIAGNOSIS — N186 End stage renal disease: Secondary | ICD-10-CM | POA: Diagnosis not present

## 2022-03-07 DIAGNOSIS — I10 Essential (primary) hypertension: Secondary | ICD-10-CM | POA: Diagnosis not present

## 2022-03-09 DIAGNOSIS — I10 Essential (primary) hypertension: Secondary | ICD-10-CM | POA: Diagnosis not present

## 2022-03-09 DIAGNOSIS — N186 End stage renal disease: Secondary | ICD-10-CM | POA: Diagnosis not present

## 2022-03-09 DIAGNOSIS — D689 Coagulation defect, unspecified: Secondary | ICD-10-CM | POA: Diagnosis not present

## 2022-03-09 DIAGNOSIS — N2581 Secondary hyperparathyroidism of renal origin: Secondary | ICD-10-CM | POA: Diagnosis not present

## 2022-03-09 DIAGNOSIS — T8249XA Other complication of vascular dialysis catheter, initial encounter: Secondary | ICD-10-CM | POA: Diagnosis not present

## 2022-03-09 DIAGNOSIS — Z992 Dependence on renal dialysis: Secondary | ICD-10-CM | POA: Diagnosis not present

## 2022-03-10 DIAGNOSIS — I10 Essential (primary) hypertension: Secondary | ICD-10-CM | POA: Diagnosis not present

## 2022-03-11 DIAGNOSIS — N186 End stage renal disease: Secondary | ICD-10-CM | POA: Diagnosis not present

## 2022-03-11 DIAGNOSIS — Z992 Dependence on renal dialysis: Secondary | ICD-10-CM | POA: Diagnosis not present

## 2022-03-11 DIAGNOSIS — N2581 Secondary hyperparathyroidism of renal origin: Secondary | ICD-10-CM | POA: Diagnosis not present

## 2022-03-11 DIAGNOSIS — D689 Coagulation defect, unspecified: Secondary | ICD-10-CM | POA: Diagnosis not present

## 2022-03-11 DIAGNOSIS — T8249XA Other complication of vascular dialysis catheter, initial encounter: Secondary | ICD-10-CM | POA: Diagnosis not present

## 2022-03-11 DIAGNOSIS — I10 Essential (primary) hypertension: Secondary | ICD-10-CM | POA: Diagnosis not present

## 2022-03-12 DIAGNOSIS — I10 Essential (primary) hypertension: Secondary | ICD-10-CM | POA: Diagnosis not present

## 2022-03-13 DIAGNOSIS — N186 End stage renal disease: Secondary | ICD-10-CM | POA: Diagnosis not present

## 2022-03-13 DIAGNOSIS — Z992 Dependence on renal dialysis: Secondary | ICD-10-CM | POA: Diagnosis not present

## 2022-03-13 DIAGNOSIS — N2581 Secondary hyperparathyroidism of renal origin: Secondary | ICD-10-CM | POA: Diagnosis not present

## 2022-03-13 DIAGNOSIS — T8249XA Other complication of vascular dialysis catheter, initial encounter: Secondary | ICD-10-CM | POA: Diagnosis not present

## 2022-03-13 DIAGNOSIS — D689 Coagulation defect, unspecified: Secondary | ICD-10-CM | POA: Diagnosis not present

## 2022-03-14 DIAGNOSIS — I10 Essential (primary) hypertension: Secondary | ICD-10-CM | POA: Diagnosis not present

## 2022-03-15 DIAGNOSIS — I10 Essential (primary) hypertension: Secondary | ICD-10-CM | POA: Diagnosis not present

## 2022-03-16 DIAGNOSIS — T8249XA Other complication of vascular dialysis catheter, initial encounter: Secondary | ICD-10-CM | POA: Diagnosis not present

## 2022-03-16 DIAGNOSIS — N2581 Secondary hyperparathyroidism of renal origin: Secondary | ICD-10-CM | POA: Diagnosis not present

## 2022-03-16 DIAGNOSIS — N186 End stage renal disease: Secondary | ICD-10-CM | POA: Diagnosis not present

## 2022-03-16 DIAGNOSIS — D689 Coagulation defect, unspecified: Secondary | ICD-10-CM | POA: Diagnosis not present

## 2022-03-16 DIAGNOSIS — Z992 Dependence on renal dialysis: Secondary | ICD-10-CM | POA: Diagnosis not present

## 2022-03-16 DIAGNOSIS — I10 Essential (primary) hypertension: Secondary | ICD-10-CM | POA: Diagnosis not present

## 2022-03-17 DIAGNOSIS — I10 Essential (primary) hypertension: Secondary | ICD-10-CM | POA: Diagnosis not present

## 2022-03-18 DIAGNOSIS — N2581 Secondary hyperparathyroidism of renal origin: Secondary | ICD-10-CM | POA: Diagnosis not present

## 2022-03-18 DIAGNOSIS — N186 End stage renal disease: Secondary | ICD-10-CM | POA: Diagnosis not present

## 2022-03-18 DIAGNOSIS — D689 Coagulation defect, unspecified: Secondary | ICD-10-CM | POA: Diagnosis not present

## 2022-03-18 DIAGNOSIS — I10 Essential (primary) hypertension: Secondary | ICD-10-CM | POA: Diagnosis not present

## 2022-03-18 DIAGNOSIS — Z992 Dependence on renal dialysis: Secondary | ICD-10-CM | POA: Diagnosis not present

## 2022-03-18 DIAGNOSIS — T8249XA Other complication of vascular dialysis catheter, initial encounter: Secondary | ICD-10-CM | POA: Diagnosis not present

## 2022-03-19 ENCOUNTER — Ambulatory Visit (INDEPENDENT_AMBULATORY_CARE_PROVIDER_SITE_OTHER): Payer: Medicare HMO | Admitting: Orthopedic Surgery

## 2022-03-19 DIAGNOSIS — I10 Essential (primary) hypertension: Secondary | ICD-10-CM | POA: Diagnosis not present

## 2022-03-19 DIAGNOSIS — Z89612 Acquired absence of left leg above knee: Secondary | ICD-10-CM

## 2022-03-20 DIAGNOSIS — I10 Essential (primary) hypertension: Secondary | ICD-10-CM | POA: Diagnosis not present

## 2022-03-21 DIAGNOSIS — I10 Essential (primary) hypertension: Secondary | ICD-10-CM | POA: Diagnosis not present

## 2022-03-22 DIAGNOSIS — I10 Essential (primary) hypertension: Secondary | ICD-10-CM | POA: Diagnosis not present

## 2022-03-23 ENCOUNTER — Encounter: Payer: Self-pay | Admitting: Orthopedic Surgery

## 2022-03-23 DIAGNOSIS — D689 Coagulation defect, unspecified: Secondary | ICD-10-CM | POA: Diagnosis not present

## 2022-03-23 DIAGNOSIS — N2581 Secondary hyperparathyroidism of renal origin: Secondary | ICD-10-CM | POA: Diagnosis not present

## 2022-03-23 DIAGNOSIS — Z992 Dependence on renal dialysis: Secondary | ICD-10-CM | POA: Diagnosis not present

## 2022-03-23 DIAGNOSIS — N186 End stage renal disease: Secondary | ICD-10-CM | POA: Diagnosis not present

## 2022-03-23 DIAGNOSIS — I10 Essential (primary) hypertension: Secondary | ICD-10-CM | POA: Diagnosis not present

## 2022-03-23 DIAGNOSIS — T8249XA Other complication of vascular dialysis catheter, initial encounter: Secondary | ICD-10-CM | POA: Diagnosis not present

## 2022-03-23 NOTE — Progress Notes (Addendum)
Office Visit Note   Patient: [name]           Date of Birth: [birth year]           MRN: [id number] Visit Date: 03/19/2022              Requested by: [name], NP [address] [place],  [name] [address] PCP: [name], NP  Chief Complaint  Patient presents with   Left Leg - Follow-up    Hx left AKA, needs prosthetic Rx.      HPI: Patient is a 41 year old gentleman who is seen for initial evaluation for left above-the-knee amputation.  Patient states that he has lost significant amount of volume in the residual limb and currently is having difficulty wearing his prosthesis difficulty ambulating due to the poor fit.  Patient is on dialysis.  Assessment & Plan: Visit Diagnoses:  1. Left below-knee amputee [facility])     Plan: Patient was provided a prescription for a new socket liner materials and supplies for his left above-knee amputation.  Patient states he has a referral to return to therapy.  Follow-Up Instructions: Return if symptoms worsen or fail to improve.   Ortho Exam  Patient is alert, oriented, no adenopathy, well-dressed, normal affect, normal respiratory effort. Examination patient has an antalgic gait he has a loose fitting prosthesis.  There are no ulcers no cellulitis.  Patient has lost significant amount of volume in the residual limb the socket has been modified and with modifications patient still is unable to safely ambulate.  Patient is an existing left transfemoral amputee.  Patient's current comorbidities are not expected to impact the ability to function with the prescribed prosthesis. Patient verbally communicates a strong desire to use a prosthesis. Patient currently requires mobility aids to ambulate without a prosthesis.  Expects not to use mobility aids with a new prosthesis.  Patient is a K2 level ambulator that will use a prosthesis to walk around their home and the community over low level environmental barriers.       Imaging: No results found. No images are attached to the encounter.  Labs: Lab Results  Component Value Date   HGBA1C 9.3 (H) 08/09/2021   HGBA1C 7.5 (H) 05/19/2021   HGBA1C 7.3 (H) 09/25/2020   ESRSEDRATE 17 (H) 05/19/2021   ESRSEDRATE 4 04/24/2019   ESRSEDRATE 78 (H) 08/18/2018   CRP 0.5 05/19/2021   CRP 7.2 (H) 02/15/2020   CRP 10.1 (H) 02/14/2020   REPTSTATUS 09/26/2020 FINAL 09/21/2020   REPTSTATUS 09/26/2020 FINAL 09/21/2020   GRAMSTAIN  09/16/2020    NO WBC SEEN NO ORGANISMS SEEN Performed at [facility], [address] [place], [name] [address]    CULT  09/21/2020    NO GROWTH 5 DAYS Performed at [facility], [address] [place], [name] [address]    CULT  09/21/2020    NO GROWTH 5 DAYS Performed at [facility], [address], [place], [name] [address]    [facility] ENTEROCOCCUS FAECALIS 09/16/2020   LABORGA PROTEUS MIRABILIS 09/16/2020     Lab Results  Component Value Date   ALBUMIN 3.7 08/09/2021   ALBUMIN 3.4 (L) 05/21/2021   ALBUMIN 2.2 (L) 10/21/2020   PREALBUMIN 26.8 05/19/2021    Lab Results  Component Value Date   MG 3.0 (H) 08/09/2021   MG 2.4 10/05/2020   MG 2.3 09/15/2020   No results found for: "VD25OH"  Lab Results  Component Value  Date   PREALBUMIN 26.8 05/19/2021      Latest Ref Rng & Units 01/21/2022   10:37 AM 08/11/2021    8:16 AM 08/09/2021   11:53 AM  CBC EXTENDED  WBC 4.0 - 10.5 K/uL 7.2  3.9  5.4   RBC 4.22 - 5.81 MIL/uL 4.67  4.70  5.01   Hemoglobin 13.0 - 17.0 g/dL 44.0  10.2  72.5   HCT 39.0 - 52.0 % 44.6  42.9  45.2   Platelets 150 - 400 K/uL 129  115  118   NEUT# 1.7 - 7.7 K/uL 4.6  2.0    Lymph# 0.7 - 4.0 K/uL 1.5  1.0       There is no height or weight on file to calculate BMI.  Orders:  No orders of the defined types were placed in this encounter.  No orders of the defined types were placed in this encounter.    Procedures: No procedures performed  Clinical  Data: No additional findings.  ROS:  All other systems negative, except as noted in the HPI. Review of Systems  Objective: Vital Signs: There were no vitals taken for this visit.  Specialty Comments:  No specialty comments available.  PMFS History: Patient Active Problem List   Diagnosis Date Noted   DKA (diabetic ketoacidosis) ([name]) 08/09/2021   Cellulitis of right leg 05/19/2021   Pressure injury of skin 10/15/2020   Adjustment disorder, unspecified 09/21/2020   Malnutrition of moderate degree 09/13/2020   Charcot's joint of foot, left    Pneumonia due to COVID-19 virus 02/14/2020   Allergy, unspecified, initial encounter 07/17/2019   Diarrhea 04/24/2019   Encounter for orthopedic aftercare following surgical amputation    DM (diabetes mellitus), secondary, uncontrolled, with neurologic complications    Headache 09/23/2018   Anemia of chronic disease 08/18/2018   Unspecified protein-calorie malnutrition ([name]) 02/14/2018   Type 1 diabetes mellitus with complication, without long-term current use of insulin ([name]) 10/25/2017   Diabetic gastroparesis ([name]) 10/25/2017   Iron deficiency anemia, unspecified 10/28/2016   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   Secondary hyperparathyroidism of renal origin ([name]) 10/28/2016   HTN (hypertension) 08/09/2014   ESRD on dialysis ([name]) 03/19/2014   Metabolic acidosis 05/29/2013   Past Medical History:  Diagnosis Date   Anemia    Cellulitis and abscess of toe of left foot    Diabetes mellitus without complication ([name])    Diabetic gastroparesis ([name])    Diabetic ulcer of left midfoot associated with diabetes mellitus due to underlying condition, with necrosis of bone ([name])    Dialysis patient ([name])    Hypertension    Renal disorder    Dialysis   Sepsis ([name])     Family History  Problem Relation Age of Onset   Diabetes Mellitus II Other    Diabetes Father    Renal Disease Father        ESRD    Past Surgical  History:  Procedure Laterality Date   AMPUTATION Right 02/02/2018   Procedure: RIGHT FIFTH TOE AND METATARSAL AMPUTATION. Filetted toe flap metatarsal resection. Debridement Plantar Foot wound;  Surgeon: Park Liter, DPM;  Location[facility] OR;  Service: Podiatry;  Laterality: Right;   AMPUTATION Left 08/20/2018   Procedure: FIFTH METATARSAL BONE BIOPSY;  Surgeon: Park Liter, DPM;  Location: [facility] OR;  Service: Podiatry;  Laterality: Left;   AMPUTATION Left 10/28/2018   Procedure: LEFT GREAT TOE AMPUTATION;  Surgeon: Park Liter, DPM;  Location: [facility] OR;  Service: Podiatry;  Laterality: Left;   AMPUTATION Left 09/16/2020   Procedure: AMPUTATION BELOW KNEE;  Surgeon: Larina Earthly, MD;  Location: [facility] OR;  Service: Vascular;  Laterality: Left;   AMPUTATION Left 10/15/2020   Procedure: AMPUTATION ABOVE KNEE;  Surgeon: Sherren Kerns, MD;  Location: [facility] OR;  Service: Vascular;  Laterality: Left;   APPLICATION OF WOUND VAC  02/02/2018   Procedure: APPLICATION OF WOUND VAC  Right Foot;  Surgeon: Park Liter, DPM;  Location: [facility] OR;  Service: Podiatry;;   APPLICATION OF WOUND VAC Left 10/28/2018   Procedure: APPLICATION OF WOUND VAC LEFT TOE;  Surgeon: Park Liter, DPM;  [facility] OR;  Service: Podiatry;  Laterality: Left;   APPLICATION OF WOUND VAC Left 11/01/2018   Procedure: APPLICATION OF WOUND VAC;  Surgeon: Park Liter, DPM;  [facility] OR;  Service: Podiatry;  Laterality: Left;   AV FISTULA PLACEMENT     left arm.   AV FISTULA PLACEMENT Right 12/22/2016   Procedure: INSERTION OF ARTERIOVENOUS (AV) GORE-TEX GRAFT ARM;  Surgeon: Sherren Kerns, MD;  Location: [facility] OR;  Service: Vascular;  Laterality: Right;   AV FISTULA PLACEMENT Left 05/26/2018   Procedure: INSERTION OF  ARTERIOVENOUS (AV) GORE-TEX GRAFT LEFT ARM;  Surgeon: Nada Libman, MD;  Location: [facility] OR;  Service: Vascular;  Laterality: Left;   EYE SURGERY     I & D EXTREMITY Right 10/31/2017   Procedure: IRRIGATION AND  DEBRIDEMENT RIGHT FOOT;  Surgeon: Park Liter, DPM;  Location: [facility] OR;  Service: Podiatry;  Laterality: Right;   I & D EXTREMITY Left 08/20/2018   Procedure: IRRIGATION AND DEBRIDEMENT EXTREMITY WITH SECONDARY WOUND CLOSUREAND APPLICATION OF WOUND VAC LEFT FOOT;  Surgeon: Park Liter, DPM;  Location[facility] OR;  Service: Podiatry;  Laterality: Left;   I & D EXTREMITY Left 10/20/2018   Procedure: IRRIGATION AND DEBRIDEMENT LEFT FOOT  DEBRIDEMENT LATERAL FOOT WOUND;  Surgeon: Park Liter, DPM;  Location[facility] OR;  Service: Podiatry;  Laterality: Left;   I & D EXTREMITY Left 10/28/2018   Procedure: IRRIGATION AND DEBRIDEMENT LEFT TOE;  Surgeon: Park Liter, DPM;  Location[facility] OR;  Service: Podiatry;  Laterality: Left;   I & D EXTREMITY Left 09/14/2020   Procedure: IRRIGATION AND DEBRIDEMENT WRIST;  Surgeon: Betha Loa, MD;  Location: [facility] OR;  Service: Orthopedics;  Laterality: Left;   INSERTION OF DIALYSIS CATHETER     Right subclavian   IR AV DIALY SHUNT INTRO NEEDLE/INTRACATH INITIAL W/PTA/IMG RIGHT Right 02/05/2018   IR FLUORO GUIDE CV LINE RIGHT  01/31/2020   IR FLUORO GUIDE CV LINE RIGHT  01/30/2022   IR PTA VENOUS EXCEPT DIALYSIS CIRCUIT  01/30/2022   IR THROMBECTOMY AV FISTULA W/THROMBOLYSIS/PTA INC/SHUNT/IMG LEFT Left 08/24/2018   IR THROMBECTOMY AV FISTULA W/THROMBOLYSIS/PTA INC/SHUNT/IMG LEFT Left 01/06/2019   IR US GUIDE VASC ACCESS LEFT  08/24/2018   IR US GUIDE VASC ACCESS RIGHT  02/05/2018   IR US GUIDE VASC ACCESS RIGHT  01/31/2020   IR VENOCAVAGRAM SVC  01/30/2022   IRRIGATION AND DEBRIDEMENT FOOT Right 10/23/2018   Procedure: Irrigation and Debridement to tendon, Left Foot;  Surgeon: Park Liter, DPM;  Location: [facility] OR;  Service: Podiatry;  Laterality: Right;   IRRIGATION AND DEBRIDEMENT FOOT Left 11/01/2018   Procedure: IRRIGATION AND DEBRIDEMENT PARTIAL WOUND CLOSURE LOCAL TISSUE TRANSFER AND FLAP ROTATION, LEFT FOOT;  Surgeon: Park Liter, DPM;  Location: [facility] OR;   Service: Podiatry;  Laterality: Left;   TRANSMETATARSAL AMPUTATION N/A 08/18/2018   Procedure: IRRIGATION AND DEBRIDEMENT OF LEFT 5TH TOE AND TRANSMETATARSAL, WITH PARTICAL LEFT 5TH TOE AND METATARSAL AMPUTATION, BONE BIOPSY, WOUND VAC APPLICATION.;  Surgeon: Park Liter, DPM;  Location: [facility] OR;  Service: Podiatry;  Laterality: N/A;   UPPER EXTREMITY VENOGRAPHY N/A 11/16/2016   Procedure: Upper Extremity Venography - Right Central;  Surgeon: Sherren Kerns, MD;  Location: [facility] INVASIVE CV LAB;  Service: Cardiovascular;  Laterality: N/A;   UPPER EXTREMITY VENOGRAPHY N/A 05/25/2018   Procedure: UPPER EXTREMITY VENOGRAPHY - Bilateral;  Surgeon: Cephus Shelling, MD;  Location: [facility] INVASIVE CV LAB;  Service: Cardiovascular;  Laterality: N/A;   Social History   Occupational History   Not on file  Tobacco Use   Smoking status: Never   Smokeless tobacco: Never  Vaping Use   Vaping Use: Never used  Substance and Sexual Activity   Alcohol use: No   Drug use: No   Sexual activity: Never

## 2022-03-24 ENCOUNTER — Telehealth: Payer: Self-pay | Admitting: Orthopedic Surgery

## 2022-03-24 DIAGNOSIS — I10 Essential (primary) hypertension: Secondary | ICD-10-CM | POA: Diagnosis not present

## 2022-03-24 NOTE — Telephone Encounter (Signed)
Autumn faxed over script to [facility]

## 2022-03-25 DIAGNOSIS — I10 Essential (primary) hypertension: Secondary | ICD-10-CM | POA: Diagnosis not present

## 2022-03-25 DIAGNOSIS — D689 Coagulation defect, unspecified: Secondary | ICD-10-CM | POA: Diagnosis not present

## 2022-03-25 DIAGNOSIS — T8249XA Other complication of vascular dialysis catheter, initial encounter: Secondary | ICD-10-CM | POA: Diagnosis not present

## 2022-03-25 DIAGNOSIS — N2581 Secondary hyperparathyroidism of renal origin: Secondary | ICD-10-CM | POA: Diagnosis not present

## 2022-03-25 DIAGNOSIS — Z992 Dependence on renal dialysis: Secondary | ICD-10-CM | POA: Diagnosis not present

## 2022-03-25 DIAGNOSIS — N186 End stage renal disease: Secondary | ICD-10-CM | POA: Diagnosis not present

## 2022-03-27 DIAGNOSIS — I129 Hypertensive chronic kidney disease with stage 1 through stage 4 chronic kidney disease, or unspecified chronic kidney disease: Secondary | ICD-10-CM | POA: Diagnosis not present

## 2022-03-27 DIAGNOSIS — N2581 Secondary hyperparathyroidism of renal origin: Secondary | ICD-10-CM | POA: Diagnosis not present

## 2022-03-27 DIAGNOSIS — Z992 Dependence on renal dialysis: Secondary | ICD-10-CM | POA: Diagnosis not present

## 2022-03-27 DIAGNOSIS — I10 Essential (primary) hypertension: Secondary | ICD-10-CM | POA: Diagnosis not present

## 2022-03-27 DIAGNOSIS — D689 Coagulation defect, unspecified: Secondary | ICD-10-CM | POA: Diagnosis not present

## 2022-03-27 DIAGNOSIS — T8249XA Other complication of vascular dialysis catheter, initial encounter: Secondary | ICD-10-CM | POA: Diagnosis not present

## 2022-03-27 DIAGNOSIS — N186 End stage renal disease: Secondary | ICD-10-CM | POA: Diagnosis not present

## 2022-03-28 DIAGNOSIS — I10 Essential (primary) hypertension: Secondary | ICD-10-CM | POA: Diagnosis not present

## 2022-03-29 DIAGNOSIS — I10 Essential (primary) hypertension: Secondary | ICD-10-CM | POA: Diagnosis not present

## 2022-03-30 DIAGNOSIS — N186 End stage renal disease: Secondary | ICD-10-CM | POA: Diagnosis not present

## 2022-03-30 DIAGNOSIS — T8249XA Other complication of vascular dialysis catheter, initial encounter: Secondary | ICD-10-CM | POA: Diagnosis not present

## 2022-03-30 DIAGNOSIS — D689 Coagulation defect, unspecified: Secondary | ICD-10-CM | POA: Diagnosis not present

## 2022-03-30 DIAGNOSIS — Z992 Dependence on renal dialysis: Secondary | ICD-10-CM | POA: Diagnosis not present

## 2022-03-30 DIAGNOSIS — N2581 Secondary hyperparathyroidism of renal origin: Secondary | ICD-10-CM | POA: Diagnosis not present

## 2022-03-30 DIAGNOSIS — I10 Essential (primary) hypertension: Secondary | ICD-10-CM | POA: Diagnosis not present

## 2022-04-01 DIAGNOSIS — N186 End stage renal disease: Secondary | ICD-10-CM | POA: Diagnosis not present

## 2022-04-01 DIAGNOSIS — I10 Essential (primary) hypertension: Secondary | ICD-10-CM | POA: Diagnosis not present

## 2022-04-01 DIAGNOSIS — N2581 Secondary hyperparathyroidism of renal origin: Secondary | ICD-10-CM | POA: Diagnosis not present

## 2022-04-01 DIAGNOSIS — T8249XA Other complication of vascular dialysis catheter, initial encounter: Secondary | ICD-10-CM | POA: Diagnosis not present

## 2022-04-01 DIAGNOSIS — D689 Coagulation defect, unspecified: Secondary | ICD-10-CM | POA: Diagnosis not present

## 2022-04-01 DIAGNOSIS — Z992 Dependence on renal dialysis: Secondary | ICD-10-CM | POA: Diagnosis not present

## 2022-04-02 DIAGNOSIS — I10 Essential (primary) hypertension: Secondary | ICD-10-CM | POA: Diagnosis not present

## 2022-04-03 DIAGNOSIS — Z992 Dependence on renal dialysis: Secondary | ICD-10-CM | POA: Diagnosis not present

## 2022-04-03 DIAGNOSIS — N186 End stage renal disease: Secondary | ICD-10-CM | POA: Diagnosis not present

## 2022-04-03 DIAGNOSIS — N2581 Secondary hyperparathyroidism of renal origin: Secondary | ICD-10-CM | POA: Diagnosis not present

## 2022-04-03 DIAGNOSIS — T8249XA Other complication of vascular dialysis catheter, initial encounter: Secondary | ICD-10-CM | POA: Diagnosis not present

## 2022-04-03 DIAGNOSIS — I10 Essential (primary) hypertension: Secondary | ICD-10-CM | POA: Diagnosis not present

## 2022-04-03 DIAGNOSIS — D689 Coagulation defect, unspecified: Secondary | ICD-10-CM | POA: Diagnosis not present

## 2022-04-04 DIAGNOSIS — N2581 Secondary hyperparathyroidism of renal origin: Secondary | ICD-10-CM | POA: Diagnosis not present

## 2022-04-04 DIAGNOSIS — Z992 Dependence on renal dialysis: Secondary | ICD-10-CM | POA: Diagnosis not present

## 2022-04-04 DIAGNOSIS — T8249XA Other complication of vascular dialysis catheter, initial encounter: Secondary | ICD-10-CM | POA: Diagnosis not present

## 2022-04-04 DIAGNOSIS — N186 End stage renal disease: Secondary | ICD-10-CM | POA: Diagnosis not present

## 2022-04-04 DIAGNOSIS — D689 Coagulation defect, unspecified: Secondary | ICD-10-CM | POA: Diagnosis not present

## 2022-04-04 DIAGNOSIS — I10 Essential (primary) hypertension: Secondary | ICD-10-CM | POA: Diagnosis not present

## 2022-04-05 DIAGNOSIS — I10 Essential (primary) hypertension: Secondary | ICD-10-CM | POA: Diagnosis not present

## 2022-04-06 DIAGNOSIS — N186 End stage renal disease: Secondary | ICD-10-CM | POA: Diagnosis not present

## 2022-04-06 DIAGNOSIS — N2581 Secondary hyperparathyroidism of renal origin: Secondary | ICD-10-CM | POA: Diagnosis not present

## 2022-04-06 DIAGNOSIS — Z992 Dependence on renal dialysis: Secondary | ICD-10-CM | POA: Diagnosis not present

## 2022-04-06 DIAGNOSIS — T8249XA Other complication of vascular dialysis catheter, initial encounter: Secondary | ICD-10-CM | POA: Diagnosis not present

## 2022-04-06 DIAGNOSIS — I10 Essential (primary) hypertension: Secondary | ICD-10-CM | POA: Diagnosis not present

## 2022-04-06 DIAGNOSIS — D689 Coagulation defect, unspecified: Secondary | ICD-10-CM | POA: Diagnosis not present

## 2022-04-07 DIAGNOSIS — I10 Essential (primary) hypertension: Secondary | ICD-10-CM | POA: Diagnosis not present

## 2022-04-08 DIAGNOSIS — N186 End stage renal disease: Secondary | ICD-10-CM | POA: Diagnosis not present

## 2022-04-08 DIAGNOSIS — I10 Essential (primary) hypertension: Secondary | ICD-10-CM | POA: Diagnosis not present

## 2022-04-08 DIAGNOSIS — T8249XA Other complication of vascular dialysis catheter, initial encounter: Secondary | ICD-10-CM | POA: Diagnosis not present

## 2022-04-08 DIAGNOSIS — N2581 Secondary hyperparathyroidism of renal origin: Secondary | ICD-10-CM | POA: Diagnosis not present

## 2022-04-08 DIAGNOSIS — Z992 Dependence on renal dialysis: Secondary | ICD-10-CM | POA: Diagnosis not present

## 2022-04-08 DIAGNOSIS — D689 Coagulation defect, unspecified: Secondary | ICD-10-CM | POA: Diagnosis not present

## 2022-04-09 DIAGNOSIS — I10 Essential (primary) hypertension: Secondary | ICD-10-CM | POA: Diagnosis not present

## 2022-04-10 DIAGNOSIS — D689 Coagulation defect, unspecified: Secondary | ICD-10-CM | POA: Diagnosis not present

## 2022-04-10 DIAGNOSIS — N186 End stage renal disease: Secondary | ICD-10-CM | POA: Diagnosis not present

## 2022-04-10 DIAGNOSIS — I10 Essential (primary) hypertension: Secondary | ICD-10-CM | POA: Diagnosis not present

## 2022-04-10 DIAGNOSIS — Z992 Dependence on renal dialysis: Secondary | ICD-10-CM | POA: Diagnosis not present

## 2022-04-10 DIAGNOSIS — N2581 Secondary hyperparathyroidism of renal origin: Secondary | ICD-10-CM | POA: Diagnosis not present

## 2022-04-10 DIAGNOSIS — T8249XA Other complication of vascular dialysis catheter, initial encounter: Secondary | ICD-10-CM | POA: Diagnosis not present

## 2022-04-11 DIAGNOSIS — I10 Essential (primary) hypertension: Secondary | ICD-10-CM | POA: Diagnosis not present

## 2022-04-12 DIAGNOSIS — I10 Essential (primary) hypertension: Secondary | ICD-10-CM | POA: Diagnosis not present

## 2022-04-13 DIAGNOSIS — Z992 Dependence on renal dialysis: Secondary | ICD-10-CM | POA: Diagnosis not present

## 2022-04-13 DIAGNOSIS — T8249XA Other complication of vascular dialysis catheter, initial encounter: Secondary | ICD-10-CM | POA: Diagnosis not present

## 2022-04-13 DIAGNOSIS — D689 Coagulation defect, unspecified: Secondary | ICD-10-CM | POA: Diagnosis not present

## 2022-04-13 DIAGNOSIS — I10 Essential (primary) hypertension: Secondary | ICD-10-CM | POA: Diagnosis not present

## 2022-04-13 DIAGNOSIS — E8779 Other fluid overload: Secondary | ICD-10-CM | POA: Diagnosis not present

## 2022-04-13 DIAGNOSIS — N2581 Secondary hyperparathyroidism of renal origin: Secondary | ICD-10-CM | POA: Diagnosis not present

## 2022-04-13 DIAGNOSIS — N186 End stage renal disease: Secondary | ICD-10-CM | POA: Diagnosis not present

## 2022-04-14 DIAGNOSIS — I10 Essential (primary) hypertension: Secondary | ICD-10-CM | POA: Diagnosis not present

## 2022-04-15 DIAGNOSIS — Z992 Dependence on renal dialysis: Secondary | ICD-10-CM | POA: Diagnosis not present

## 2022-04-15 DIAGNOSIS — N2581 Secondary hyperparathyroidism of renal origin: Secondary | ICD-10-CM | POA: Diagnosis not present

## 2022-04-15 DIAGNOSIS — E8779 Other fluid overload: Secondary | ICD-10-CM | POA: Diagnosis not present

## 2022-04-15 DIAGNOSIS — T8249XA Other complication of vascular dialysis catheter, initial encounter: Secondary | ICD-10-CM | POA: Diagnosis not present

## 2022-04-15 DIAGNOSIS — D689 Coagulation defect, unspecified: Secondary | ICD-10-CM | POA: Diagnosis not present

## 2022-04-15 DIAGNOSIS — I10 Essential (primary) hypertension: Secondary | ICD-10-CM | POA: Diagnosis not present

## 2022-04-15 DIAGNOSIS — N186 End stage renal disease: Secondary | ICD-10-CM | POA: Diagnosis not present

## 2022-04-16 DIAGNOSIS — I10 Essential (primary) hypertension: Secondary | ICD-10-CM | POA: Diagnosis not present

## 2022-04-17 ENCOUNTER — Other Ambulatory Visit: Payer: Self-pay

## 2022-04-17 ENCOUNTER — Emergency Department (HOSPITAL_COMMUNITY)
Admission: EM | Admit: 2022-04-17 | Discharge: 2022-04-17 | Disposition: A | Discharge status: Home / Self Care | Payer: Medicare HMO | Attending: Emergency Medicine | Admitting: Emergency Medicine

## 2022-04-17 ENCOUNTER — Emergency Department (HOSPITAL_COMMUNITY): Payer: Medicare HMO

## 2022-04-17 DIAGNOSIS — Z794 Long term (current) use of insulin: Secondary | ICD-10-CM | POA: Diagnosis not present

## 2022-04-17 DIAGNOSIS — R569 Unspecified convulsions: Secondary | ICD-10-CM | POA: Insufficient documentation

## 2022-04-17 DIAGNOSIS — E8779 Other fluid overload: Secondary | ICD-10-CM | POA: Diagnosis not present

## 2022-04-17 DIAGNOSIS — E1022 Type 1 diabetes mellitus with diabetic chronic kidney disease: Secondary | ICD-10-CM | POA: Insufficient documentation

## 2022-04-17 DIAGNOSIS — N2581 Secondary hyperparathyroidism of renal origin: Secondary | ICD-10-CM | POA: Diagnosis not present

## 2022-04-17 DIAGNOSIS — R109 Unspecified abdominal pain: Secondary | ICD-10-CM | POA: Insufficient documentation

## 2022-04-17 DIAGNOSIS — Z992 Dependence on renal dialysis: Secondary | ICD-10-CM | POA: Diagnosis not present

## 2022-04-17 DIAGNOSIS — R410 Disorientation, unspecified: Secondary | ICD-10-CM

## 2022-04-17 DIAGNOSIS — R4182 Altered mental status, unspecified: Secondary | ICD-10-CM | POA: Diagnosis not present

## 2022-04-17 DIAGNOSIS — D689 Coagulation defect, unspecified: Secondary | ICD-10-CM | POA: Diagnosis not present

## 2022-04-17 DIAGNOSIS — R001 Bradycardia, unspecified: Secondary | ICD-10-CM | POA: Diagnosis not present

## 2022-04-17 DIAGNOSIS — Z743 Need for continuous supervision: Secondary | ICD-10-CM | POA: Diagnosis not present

## 2022-04-17 DIAGNOSIS — N186 End stage renal disease: Secondary | ICD-10-CM | POA: Insufficient documentation

## 2022-04-17 DIAGNOSIS — T8249XA Other complication of vascular dialysis catheter, initial encounter: Secondary | ICD-10-CM | POA: Diagnosis not present

## 2022-04-17 LAB — CBC WITH DIFFERENTIAL/PLATELET
Abs Immature Granulocytes: 0.03 10*3/uL (ref 0.00–0.07)
Basophils Absolute: 0 10*3/uL (ref 0.0–0.1)
Basophils Relative: 0 %
Eosinophils Absolute: 0.9 10*3/uL — ABNORMAL HIGH (ref 0.0–0.5)
Eosinophils Relative: 11 %
HCT: 43.7 % (ref 39.0–52.0)
Hemoglobin: 13.8 g/dL (ref 13.0–17.0)
Immature Granulocytes: 0 %
Lymphocytes Relative: 11 %
Lymphs Abs: 0.9 10*3/uL (ref 0.7–4.0)
MCH: 29.1 pg (ref 26.0–34.0)
MCHC: 31.6 g/dL (ref 30.0–36.0)
MCV: 92.2 fL (ref 80.0–100.0)
Monocytes Absolute: 0.8 10*3/uL (ref 0.1–1.0)
Monocytes Relative: 10 %
Neutro Abs: 5.7 10*3/uL (ref 1.7–7.7)
Neutrophils Relative %: 68 %
Platelets: 146 10*3/uL — ABNORMAL LOW (ref 150–400)
RBC: 4.74 MIL/uL (ref 4.22–5.81)
RDW: 13.7 % (ref 11.5–15.5)
WBC: 8.3 10*3/uL (ref 4.0–10.5)
nRBC: 0 % (ref 0.0–0.2)

## 2022-04-17 LAB — ETHANOL: Alcohol, Ethyl (B): <10 mg/dL (ref <10)

## 2022-04-17 LAB — I-STAT VENOUS BLOOD GAS, ED
Acid-base deficit: 2 mmol/L (ref 0.0–2.0)
Bicarbonate: 24.1 mmol/L (ref 20.0–28.0)
Calcium, Ion: 1.04 mmol/L — ABNORMAL LOW (ref 1.15–1.40)
HCT: 46 % (ref 39.0–52.0)
Hemoglobin: 15.6 g/dL (ref 13.0–17.0)
O2 Saturation: 51 %
Potassium: 4.3 mmol/L (ref 3.5–5.1)
Sodium: 132 mmol/L — ABNORMAL LOW (ref 135–145)
TCO2: 25 mmol/L (ref 22–32)
pCO2, Ven: 42.9 mmHg — ABNORMAL LOW (ref 44–60)
pH, Ven: 7.358 (ref 7.25–7.43)
pO2, Ven: 29 mmHg — CL (ref 32–45)

## 2022-04-17 LAB — CBG MONITORING, ED
Glucose-Capillary: 178 mg/dL — ABNORMAL HIGH (ref 70–99)
Glucose-Capillary: 330 mg/dL — ABNORMAL HIGH (ref 70–99)

## 2022-04-17 LAB — COMPREHENSIVE METABOLIC PANEL
ALT: 30 U/L (ref 0–44)
AST: 25 U/L (ref 15–41)
Albumin: 3 g/dL — ABNORMAL LOW (ref 3.5–5.0)
Alkaline Phosphatase: 176 U/L — ABNORMAL HIGH (ref 38–126)
Anion gap: 15 (ref 5–15)
BUN: 42 mg/dL — ABNORMAL HIGH (ref 6–20)
CO2: 20 mmol/L — ABNORMAL LOW (ref 22–32)
Calcium: 8.6 mg/dL — ABNORMAL LOW (ref 8.9–10.3)
Chloride: 98 mmol/L (ref 98–111)
Creatinine, Ser: 9.06 mg/dL — ABNORMAL HIGH (ref 0.61–1.24)
GFR, Estimated: 7 mL/min — ABNORMAL LOW (ref >60)
Glucose, Bld: 303 mg/dL — ABNORMAL HIGH (ref 70–99)
Potassium: 4.1 mmol/L (ref 3.5–5.1)
Sodium: 133 mmol/L — ABNORMAL LOW (ref 135–145)
Total Bilirubin: 0.8 mg/dL (ref 0.3–1.2)
Total Protein: 6.8 g/dL (ref 6.5–8.1)

## 2022-04-17 LAB — LACTIC ACID, PLASMA
Lactic Acid, Venous: 1.5 mmol/L (ref 0.5–1.9)
Lactic Acid, Venous: 1.1 mmol/L (ref 0.5–1.9)

## 2022-04-17 LAB — LIPASE, BLOOD: Lipase: 23 U/L (ref 11–51)

## 2022-04-17 MED ORDER — OXYCODONE-ACETAMINOPHEN 5-325 MG PO TABS
1.0000 | ORAL_TABLET | Freq: Once | ORAL | Status: AC
  Administered 2022-04-17: 1 via ORAL
  Filled 2022-04-17: qty 1

## 2022-04-17 MED ORDER — INSULIN ASPART 100 UNIT/ML IJ SOLN
4.0000 [IU] | Freq: Once | INTRAMUSCULAR | Status: AC
  Administered 2022-04-17: 4 [IU] via INTRAVENOUS

## 2022-04-17 NOTE — ED Notes (Signed)
RN unable to obtain Labs.

## 2022-04-17 NOTE — ED Triage Notes (Signed)
Pt bib [facility] from dialysis. Pt only received 49 minutes. Pt started having seizure like activity. Pt does not have hx of seizure. Unknown how long it lasted. EMS witness some twitching that lasted about a minute. No meds given. Pt confused.   EMS vitals 144/84 BP 243 CBG

## 2022-04-17 NOTE — ED Provider Notes (Signed)
I assumed care of the patient at 3:30 PM from Dr. Tyrone Nine.  Patient presenting from dialysis after having an episode of going to sleep and twitching and being mildly confused.  When patient arrived here Dr. Tyrone Nine witnessed a similar event but he was responsive throughout this.  Within 1 hour patient is back at his baseline.  He is ambulating to the bathroom without difficulty.  I independently interpreted patient's labs today and lactic acid within normal limits, CMP with evidence of end-stage renal disease but normal potassium and sodium mildly low at 133, normal anion gap, lipase within normal limits, VBG without evidence of acute findings today, EtOH and CBC are within normal limits.  I have independently visualized and interpreted pt's images today.  Head CT today without acute findings specifically no intracranial hemorrhage.  Radiology did not see any acute findings.  Findings were discussed with the patient.  At this time he is eating and drinking normally and at his baseline.  His mother is also present and confirms that he is his normal self.  Is unclear what this episode was today.  Concern for possible first-time seizure as he does not have a history of this.  He was not hypotensive and there is no evidence concerning for sepsis today.  Low suspicion that this was TIA.  We will give ambulatory referral to neurology but feel that patient is stable for discharge at this time.  Also patient has a wound to his right index finger that was a result of getting it caught in his prosthesis and it did rip the skin.  His nail is still intact but he does have loss of tissue to a portion of his cuticle.  There is no evidence of infection at this time.  Did refer patient to wound care to ensure that he does not have complications for this given his poor healing ability due to his significant diabetes.    Blanchie Dessert, MD 04/17/22 2106

## 2022-04-17 NOTE — Discharge Instructions (Signed)
Thankfully all your lab work today is normal and your CAT scan looks normal.  However this episode may have been a seizure today and it is a good idea to follow-up with the neurologist.  A referral was made and they will call you for an appointment.  Otherwise continue taking your medications as prescribed.

## 2022-04-17 NOTE — ED Provider Notes (Signed)
[name] EMERGENCY DEPARTMENT Provider Note   CSN: [id number] Arrival date & time: 04/17/22  1335     History PMHx significant for ESRD on dialysis, T1DM Chief Complaint  Patient presents with   Seizures    [name] is a 41 y.o. male.  Patient is a 41 year old male with PMH significant for T1DM and ESRD on dialysis who presented with altered mental status after seizure-like activity at dialysis this morning.  History was obtained by EMS who states patient had received 49 minutes of dialysis when they noticed seizure-like activity.  Unsure how long this activity lasted or description of the activity.  EMS states patient seemed confused, thought he was at home, thought EMS check with his mother but was able to speak and was alert.  EMS noted patient had some sort of "body spasm" when they were transporting him to the ambulance but was able to come out of the spasms quickly went into sunlight and covered his face from the sun.  Blood sugar with EMS was 243.  Patient remains confused, continues to state that he was at his home when EMS arrived and that his mother was there although EMS states his mother was not there.  Patient continues to state that today is Wednesday although it is Friday.  He continues to make comments that he believes people are trying to "trick him" and required reassurance that he was not in jail and that he was at a hospital.  Patient has a difficult time answering review of system questions and continues to state he is having a problem with his memory and he is sorry, he keeps stating that he is not stupid.  During examination, patient had 3 episodes where he closed his eyes and body began to rhythmically shake including head coming on and off bed.  He was able to come out of each episode and under 30 seconds with verbal or painful stimuli and did not seem postictal as he was immediately able to continue conversation.  Attempted to contact pt's  mother to obtain more information about patients baseline mental status but got voice mail.  I was able to speak with patient's first cousin, [name] who was on his contact list.  She stated that patient is alert and oriented x4 at baseline, does not have paranoid thoughts, does not get confused about day of week or who people are. States he lives with his mother who helps him with higher level ADLs such as shopping, cooking, driving due to still getting used to prosthetic after amputation about a year ago.   Seizures     Home Medications Prior to Admission medications   Medication Sig Start Date End Date Taking? Authorizing Provider  calcitRIOL (ROCALTROL) 0.5 MCG capsule Take 3 capsules (1.5 mcg total) by mouth every Monday, Wednesday, and Friday with hemodialysis. 05/23/21   Hosie Poisson, MD  cinacalcet (SENSIPAR) 60 MG tablet Take 120 mg by mouth every evening. 04/10/22   [provider]  gabapentin (NEURONTIN) 100 MG capsule Take 1 capsule (100 mg total) by mouth 3 (three) times daily. 03/04/22   Kerin Perna, NP  insulin aspart (NOVOLOG) 100 UNIT/ML injection Inject 10 Units into the skin 3 (three) times daily before meals. For blood sugars 0-150 give 0 units of insulin, 151-200 give 2 units of insulin, 201-250 give 4 units, 251-300 give 6 units, 301-350 give 8 units, 351-400 give 10 units,> 400 give 12 units and call M.D. Discussed hypoglycemia protocol.  Patient taking differently: Inject 10 Units into the skin 3 (three) times daily before meals. CBG 0-150 give 0 units  CBG 151-200 give 2 units CBG 201-250 give 4 units CBG 251-300 give 6 units CBG 301-350 give 8 units CBG 351-400 give 10 units CBG > 400 give 12 units and call M.D. 02/24/22   Kerin Perna, NP  multivitamin (RENA-VIT) TABS tablet Take 1 tablet by mouth at bedtime. 05/23/21   Hosie Poisson, MD  sevelamer carbonate (RENVELA) 800 MG tablet Take 2,400-4,000 mg by mouth See admin instructions. Take 4,000  mg (5 tablets) by mouth with meals and 2,400 mg (3 tablets) with snacks 04/27/19   [provider]      Allergies    Patient has no known allergies.    Review of Systems   Review of Systems  Constitutional:  Positive for fatigue.  Respiratory:  Negative for shortness of breath.   Cardiovascular:  Negative for chest pain.  Gastrointestinal:  Negative for abdominal pain.  Neurological:  Positive for weakness and light-headedness.  Psychiatric/Behavioral:  Positive for confusion.     Physical Exam Updated Vital Signs BP 134/87   Pulse 92   Temp 97.7 F (36.5 C) (Oral)   Resp 16   SpO2 99%  Physical Exam Constitutional:      Appearance: He is toxic-appearing. He is not ill-appearing.  Eyes:     Conjunctiva/sclera: Conjunctivae normal.  Cardiovascular:     Rate and Rhythm: Normal rate and regular rhythm.  Pulmonary:     Effort: Pulmonary effort is normal.     Breath sounds: Normal breath sounds.  Abdominal:     General: Bowel sounds are normal. There is no distension.     Palpations: Abdomen is soft.     Tenderness: There is abdominal tenderness.  Skin:    General: Skin is warm and dry.  Neurological:     General: No focal deficit present.     Mental Status: He is alert and oriented to person, place, and time. He is confused.     Sensory: Sensation is intact.     Motor: Motor function is intact.     ED Results / Procedures / Treatments   Labs (all labs ordered are listed, but only abnormal results are displayed) Labs Reviewed  CBG MONITORING, ED - Abnormal; Notable for the following components:      Result Value   Glucose-Capillary 178 (*)    All other components within normal limits  RAPID URINE DRUG SCREEN, HOSP PERFORMED  ETHANOL  COMPREHENSIVE METABOLIC PANEL  LACTIC ACID, PLASMA  LACTIC ACID, PLASMA  LIPASE, BLOOD  CBC WITH DIFFERENTIAL/PLATELET    EKG None  Radiology DG Chest Portable 1 View  Result Date: 04/17/2022 CLINICAL DATA:   Altered mental status. EXAM: PORTABLE CHEST 1 VIEW COMPARISON:  August 08, 2021. FINDINGS: Stable cardiomediastinal silhouette. Right internal jugular dialysis catheter is unchanged in position. No acute pulmonary disease. Bony thorax is unremarkable. IMPRESSION: No active disease. Electronically Signed   By: Marijo Conception M.D.   On: 04/17/2022 14:32    Procedures Procedures    Medications Ordered in ED Medications - No data to display  ED Course/ Medical Decision Making/ A&P                           Medical Decision Making Patient is a 41 year old male with PMH significant for T1DM and ESRD on dialysis who presented with altered mental status  after seizure-like activity at dialysis this morning.  Patient remains confused about day of week and continues to need reassurance that he is in a hospital, not a jail. Activity witnessed in ED as described in HPI is less likely to be epileptic seizure activity as patient did not bite his tongue, did not lose bowel or bladder function was able to come out of these episodes with verbal and painful stimuli very quickly and did not appear to be postictal as he was able to answer questions immediately afterward. He does seem confused and we cannot rule out seizure activity.  Blood glucose level obtained by EMS and level of 178 in ED are reassuring, will check lactic acid, bicarb, and AG.  Physical exam is reassuring against stroke as there are no focal deficits but cannot be completely ruled out.  We will rule out causes of altered mental status including drug-induced, stroke, other intracranial pathology, metabolic, and infection with labs and studies including ethanol level, UDS, CMP, CBC, CXR and head CT. Patient did have abdominal tenderness on exam, will check lipase to rule out pancreatitis.  Pt has been signed out to Dr. Maryan Rued who will take over care.    Final Clinical Impression(s) / ED Diagnoses Final diagnoses:  Disorientation    Rx / DC  Orders ED Discharge Orders     None         Precious Gilding, DO 04/17/22 [place], [place], DO 04/18/22 [phone]

## 2022-04-18 DIAGNOSIS — Z992 Dependence on renal dialysis: Secondary | ICD-10-CM | POA: Diagnosis not present

## 2022-04-18 DIAGNOSIS — E8779 Other fluid overload: Secondary | ICD-10-CM | POA: Diagnosis not present

## 2022-04-18 DIAGNOSIS — T8249XA Other complication of vascular dialysis catheter, initial encounter: Secondary | ICD-10-CM | POA: Diagnosis not present

## 2022-04-18 DIAGNOSIS — D689 Coagulation defect, unspecified: Secondary | ICD-10-CM | POA: Diagnosis not present

## 2022-04-18 DIAGNOSIS — I10 Essential (primary) hypertension: Secondary | ICD-10-CM | POA: Diagnosis not present

## 2022-04-18 DIAGNOSIS — N186 End stage renal disease: Secondary | ICD-10-CM | POA: Diagnosis not present

## 2022-04-18 DIAGNOSIS — N2581 Secondary hyperparathyroidism of renal origin: Secondary | ICD-10-CM | POA: Diagnosis not present

## 2022-04-19 DIAGNOSIS — I10 Essential (primary) hypertension: Secondary | ICD-10-CM | POA: Diagnosis not present

## 2022-04-20 DIAGNOSIS — Z992 Dependence on renal dialysis: Secondary | ICD-10-CM | POA: Diagnosis not present

## 2022-04-20 DIAGNOSIS — N186 End stage renal disease: Secondary | ICD-10-CM | POA: Diagnosis not present

## 2022-04-20 DIAGNOSIS — I10 Essential (primary) hypertension: Secondary | ICD-10-CM | POA: Diagnosis not present

## 2022-04-20 DIAGNOSIS — T8249XA Other complication of vascular dialysis catheter, initial encounter: Secondary | ICD-10-CM | POA: Diagnosis not present

## 2022-04-20 DIAGNOSIS — N2581 Secondary hyperparathyroidism of renal origin: Secondary | ICD-10-CM | POA: Diagnosis not present

## 2022-04-20 DIAGNOSIS — D689 Coagulation defect, unspecified: Secondary | ICD-10-CM | POA: Diagnosis not present

## 2022-04-21 DIAGNOSIS — I10 Essential (primary) hypertension: Secondary | ICD-10-CM | POA: Diagnosis not present

## 2022-04-22 DIAGNOSIS — T8249XA Other complication of vascular dialysis catheter, initial encounter: Secondary | ICD-10-CM | POA: Diagnosis not present

## 2022-04-22 DIAGNOSIS — N2581 Secondary hyperparathyroidism of renal origin: Secondary | ICD-10-CM | POA: Diagnosis not present

## 2022-04-22 DIAGNOSIS — I10 Essential (primary) hypertension: Secondary | ICD-10-CM | POA: Diagnosis not present

## 2022-04-22 DIAGNOSIS — Z992 Dependence on renal dialysis: Secondary | ICD-10-CM | POA: Diagnosis not present

## 2022-04-22 DIAGNOSIS — N186 End stage renal disease: Secondary | ICD-10-CM | POA: Diagnosis not present

## 2022-04-22 DIAGNOSIS — D689 Coagulation defect, unspecified: Secondary | ICD-10-CM | POA: Diagnosis not present

## 2022-04-23 DIAGNOSIS — I10 Essential (primary) hypertension: Secondary | ICD-10-CM | POA: Diagnosis not present

## 2022-04-24 DIAGNOSIS — T8249XA Other complication of vascular dialysis catheter, initial encounter: Secondary | ICD-10-CM | POA: Diagnosis not present

## 2022-04-24 DIAGNOSIS — N186 End stage renal disease: Secondary | ICD-10-CM | POA: Diagnosis not present

## 2022-04-24 DIAGNOSIS — N2581 Secondary hyperparathyroidism of renal origin: Secondary | ICD-10-CM | POA: Diagnosis not present

## 2022-04-24 DIAGNOSIS — D689 Coagulation defect, unspecified: Secondary | ICD-10-CM | POA: Diagnosis not present

## 2022-04-24 DIAGNOSIS — Z992 Dependence on renal dialysis: Secondary | ICD-10-CM | POA: Diagnosis not present

## 2022-04-27 DIAGNOSIS — I129 Hypertensive chronic kidney disease with stage 1 through stage 4 chronic kidney disease, or unspecified chronic kidney disease: Secondary | ICD-10-CM | POA: Diagnosis not present

## 2022-04-27 DIAGNOSIS — D689 Coagulation defect, unspecified: Secondary | ICD-10-CM | POA: Diagnosis not present

## 2022-04-27 DIAGNOSIS — N2581 Secondary hyperparathyroidism of renal origin: Secondary | ICD-10-CM | POA: Diagnosis not present

## 2022-04-27 DIAGNOSIS — T8249XA Other complication of vascular dialysis catheter, initial encounter: Secondary | ICD-10-CM | POA: Diagnosis not present

## 2022-04-27 DIAGNOSIS — Z992 Dependence on renal dialysis: Secondary | ICD-10-CM | POA: Diagnosis not present

## 2022-04-27 DIAGNOSIS — N186 End stage renal disease: Secondary | ICD-10-CM | POA: Diagnosis not present

## 2022-04-29 DIAGNOSIS — N186 End stage renal disease: Secondary | ICD-10-CM | POA: Diagnosis not present

## 2022-04-29 DIAGNOSIS — D689 Coagulation defect, unspecified: Secondary | ICD-10-CM | POA: Diagnosis not present

## 2022-04-29 DIAGNOSIS — N2581 Secondary hyperparathyroidism of renal origin: Secondary | ICD-10-CM | POA: Diagnosis not present

## 2022-04-29 DIAGNOSIS — T8249XA Other complication of vascular dialysis catheter, initial encounter: Secondary | ICD-10-CM | POA: Diagnosis not present

## 2022-04-29 DIAGNOSIS — Z992 Dependence on renal dialysis: Secondary | ICD-10-CM | POA: Diagnosis not present

## 2022-05-01 DIAGNOSIS — T8249XA Other complication of vascular dialysis catheter, initial encounter: Secondary | ICD-10-CM | POA: Diagnosis not present

## 2022-05-01 DIAGNOSIS — D689 Coagulation defect, unspecified: Secondary | ICD-10-CM | POA: Diagnosis not present

## 2022-05-01 DIAGNOSIS — N186 End stage renal disease: Secondary | ICD-10-CM | POA: Diagnosis not present

## 2022-05-01 DIAGNOSIS — Z992 Dependence on renal dialysis: Secondary | ICD-10-CM | POA: Diagnosis not present

## 2022-05-01 DIAGNOSIS — N2581 Secondary hyperparathyroidism of renal origin: Secondary | ICD-10-CM | POA: Diagnosis not present

## 2022-05-06 DIAGNOSIS — N2581 Secondary hyperparathyroidism of renal origin: Secondary | ICD-10-CM | POA: Diagnosis not present

## 2022-05-06 DIAGNOSIS — T8249XA Other complication of vascular dialysis catheter, initial encounter: Secondary | ICD-10-CM | POA: Diagnosis not present

## 2022-05-06 DIAGNOSIS — N186 End stage renal disease: Secondary | ICD-10-CM | POA: Diagnosis not present

## 2022-05-06 DIAGNOSIS — Z992 Dependence on renal dialysis: Secondary | ICD-10-CM | POA: Diagnosis not present

## 2022-05-06 DIAGNOSIS — D689 Coagulation defect, unspecified: Secondary | ICD-10-CM | POA: Diagnosis not present

## 2022-05-08 DIAGNOSIS — D689 Coagulation defect, unspecified: Secondary | ICD-10-CM | POA: Diagnosis not present

## 2022-05-08 DIAGNOSIS — Z992 Dependence on renal dialysis: Secondary | ICD-10-CM | POA: Diagnosis not present

## 2022-05-08 DIAGNOSIS — N186 End stage renal disease: Secondary | ICD-10-CM | POA: Diagnosis not present

## 2022-05-08 DIAGNOSIS — N2581 Secondary hyperparathyroidism of renal origin: Secondary | ICD-10-CM | POA: Diagnosis not present

## 2022-05-08 DIAGNOSIS — T8249XA Other complication of vascular dialysis catheter, initial encounter: Secondary | ICD-10-CM | POA: Diagnosis not present

## 2022-05-11 DIAGNOSIS — Z992 Dependence on renal dialysis: Secondary | ICD-10-CM | POA: Diagnosis not present

## 2022-05-11 DIAGNOSIS — N2581 Secondary hyperparathyroidism of renal origin: Secondary | ICD-10-CM | POA: Diagnosis not present

## 2022-05-11 DIAGNOSIS — T8249XA Other complication of vascular dialysis catheter, initial encounter: Secondary | ICD-10-CM | POA: Diagnosis not present

## 2022-05-11 DIAGNOSIS — N186 End stage renal disease: Secondary | ICD-10-CM | POA: Diagnosis not present

## 2022-05-11 DIAGNOSIS — D689 Coagulation defect, unspecified: Secondary | ICD-10-CM | POA: Diagnosis not present

## 2022-05-13 DIAGNOSIS — T8249XA Other complication of vascular dialysis catheter, initial encounter: Secondary | ICD-10-CM | POA: Diagnosis not present

## 2022-05-13 DIAGNOSIS — Z992 Dependence on renal dialysis: Secondary | ICD-10-CM | POA: Diagnosis not present

## 2022-05-13 DIAGNOSIS — D689 Coagulation defect, unspecified: Secondary | ICD-10-CM | POA: Diagnosis not present

## 2022-05-13 DIAGNOSIS — N2581 Secondary hyperparathyroidism of renal origin: Secondary | ICD-10-CM | POA: Diagnosis not present

## 2022-05-13 DIAGNOSIS — N186 End stage renal disease: Secondary | ICD-10-CM | POA: Diagnosis not present

## 2022-05-18 DIAGNOSIS — D689 Coagulation defect, unspecified: Secondary | ICD-10-CM | POA: Diagnosis not present

## 2022-05-18 DIAGNOSIS — T8249XA Other complication of vascular dialysis catheter, initial encounter: Secondary | ICD-10-CM | POA: Diagnosis not present

## 2022-05-18 DIAGNOSIS — N186 End stage renal disease: Secondary | ICD-10-CM | POA: Diagnosis not present

## 2022-05-18 DIAGNOSIS — N2581 Secondary hyperparathyroidism of renal origin: Secondary | ICD-10-CM | POA: Diagnosis not present

## 2022-05-18 DIAGNOSIS — Z992 Dependence on renal dialysis: Secondary | ICD-10-CM | POA: Diagnosis not present

## 2022-05-22 ENCOUNTER — Encounter (INDEPENDENT_AMBULATORY_CARE_PROVIDER_SITE_OTHER): Payer: Medicare HMO

## 2022-05-22 DIAGNOSIS — Z992 Dependence on renal dialysis: Secondary | ICD-10-CM | POA: Diagnosis not present

## 2022-05-22 DIAGNOSIS — T8249XA Other complication of vascular dialysis catheter, initial encounter: Secondary | ICD-10-CM | POA: Diagnosis not present

## 2022-05-22 DIAGNOSIS — N2581 Secondary hyperparathyroidism of renal origin: Secondary | ICD-10-CM | POA: Diagnosis not present

## 2022-05-22 DIAGNOSIS — N186 End stage renal disease: Secondary | ICD-10-CM | POA: Diagnosis not present

## 2022-05-22 DIAGNOSIS — D689 Coagulation defect, unspecified: Secondary | ICD-10-CM | POA: Diagnosis not present

## 2022-05-25 DIAGNOSIS — T8249XA Other complication of vascular dialysis catheter, initial encounter: Secondary | ICD-10-CM | POA: Diagnosis not present

## 2022-05-25 DIAGNOSIS — D689 Coagulation defect, unspecified: Secondary | ICD-10-CM | POA: Diagnosis not present

## 2022-05-25 DIAGNOSIS — Z992 Dependence on renal dialysis: Secondary | ICD-10-CM | POA: Diagnosis not present

## 2022-05-25 DIAGNOSIS — N186 End stage renal disease: Secondary | ICD-10-CM | POA: Diagnosis not present

## 2022-05-25 DIAGNOSIS — N2581 Secondary hyperparathyroidism of renal origin: Secondary | ICD-10-CM | POA: Diagnosis not present

## 2022-05-27 DIAGNOSIS — D689 Coagulation defect, unspecified: Secondary | ICD-10-CM | POA: Diagnosis not present

## 2022-05-27 DIAGNOSIS — N186 End stage renal disease: Secondary | ICD-10-CM | POA: Diagnosis not present

## 2022-05-27 DIAGNOSIS — T8249XA Other complication of vascular dialysis catheter, initial encounter: Secondary | ICD-10-CM | POA: Diagnosis not present

## 2022-05-27 DIAGNOSIS — N2581 Secondary hyperparathyroidism of renal origin: Secondary | ICD-10-CM | POA: Diagnosis not present

## 2022-05-27 DIAGNOSIS — Z992 Dependence on renal dialysis: Secondary | ICD-10-CM | POA: Diagnosis not present

## 2022-05-28 DIAGNOSIS — Z992 Dependence on renal dialysis: Secondary | ICD-10-CM | POA: Diagnosis not present

## 2022-05-28 DIAGNOSIS — I129 Hypertensive chronic kidney disease with stage 1 through stage 4 chronic kidney disease, or unspecified chronic kidney disease: Secondary | ICD-10-CM | POA: Diagnosis not present

## 2022-05-28 DIAGNOSIS — N186 End stage renal disease: Secondary | ICD-10-CM | POA: Diagnosis not present

## 2022-05-29 DIAGNOSIS — T8249XA Other complication of vascular dialysis catheter, initial encounter: Secondary | ICD-10-CM | POA: Diagnosis not present

## 2022-05-29 DIAGNOSIS — D689 Coagulation defect, unspecified: Secondary | ICD-10-CM | POA: Diagnosis not present

## 2022-05-29 DIAGNOSIS — N2581 Secondary hyperparathyroidism of renal origin: Secondary | ICD-10-CM | POA: Diagnosis not present

## 2022-05-29 DIAGNOSIS — Z992 Dependence on renal dialysis: Secondary | ICD-10-CM | POA: Diagnosis not present

## 2022-05-29 DIAGNOSIS — N186 End stage renal disease: Secondary | ICD-10-CM | POA: Diagnosis not present

## 2022-06-01 DIAGNOSIS — N186 End stage renal disease: Secondary | ICD-10-CM | POA: Diagnosis not present

## 2022-06-01 DIAGNOSIS — Z992 Dependence on renal dialysis: Secondary | ICD-10-CM | POA: Diagnosis not present

## 2022-06-01 DIAGNOSIS — T8249XA Other complication of vascular dialysis catheter, initial encounter: Secondary | ICD-10-CM | POA: Diagnosis not present

## 2022-06-01 DIAGNOSIS — N2581 Secondary hyperparathyroidism of renal origin: Secondary | ICD-10-CM | POA: Diagnosis not present

## 2022-06-01 DIAGNOSIS — D689 Coagulation defect, unspecified: Secondary | ICD-10-CM | POA: Diagnosis not present

## 2022-06-03 DIAGNOSIS — Z992 Dependence on renal dialysis: Secondary | ICD-10-CM | POA: Diagnosis not present

## 2022-06-03 DIAGNOSIS — N186 End stage renal disease: Secondary | ICD-10-CM | POA: Diagnosis not present

## 2022-06-03 DIAGNOSIS — N2581 Secondary hyperparathyroidism of renal origin: Secondary | ICD-10-CM | POA: Diagnosis not present

## 2022-06-03 DIAGNOSIS — D689 Coagulation defect, unspecified: Secondary | ICD-10-CM | POA: Diagnosis not present

## 2022-06-03 DIAGNOSIS — T8249XA Other complication of vascular dialysis catheter, initial encounter: Secondary | ICD-10-CM | POA: Diagnosis not present

## 2022-06-05 DIAGNOSIS — N186 End stage renal disease: Secondary | ICD-10-CM | POA: Diagnosis not present

## 2022-06-05 DIAGNOSIS — T8249XA Other complication of vascular dialysis catheter, initial encounter: Secondary | ICD-10-CM | POA: Diagnosis not present

## 2022-06-05 DIAGNOSIS — D689 Coagulation defect, unspecified: Secondary | ICD-10-CM | POA: Diagnosis not present

## 2022-06-05 DIAGNOSIS — Z992 Dependence on renal dialysis: Secondary | ICD-10-CM | POA: Diagnosis not present

## 2022-06-05 DIAGNOSIS — N2581 Secondary hyperparathyroidism of renal origin: Secondary | ICD-10-CM | POA: Diagnosis not present

## 2022-06-08 ENCOUNTER — Other Ambulatory Visit (INDEPENDENT_AMBULATORY_CARE_PROVIDER_SITE_OTHER): Payer: Self-pay | Admitting: Primary Care

## 2022-06-08 DIAGNOSIS — E118 Type 2 diabetes mellitus with unspecified complications: Secondary | ICD-10-CM

## 2022-06-08 DIAGNOSIS — N186 End stage renal disease: Secondary | ICD-10-CM | POA: Diagnosis not present

## 2022-06-08 DIAGNOSIS — T8249XA Other complication of vascular dialysis catheter, initial encounter: Secondary | ICD-10-CM | POA: Diagnosis not present

## 2022-06-08 DIAGNOSIS — Z992 Dependence on renal dialysis: Secondary | ICD-10-CM | POA: Diagnosis not present

## 2022-06-08 DIAGNOSIS — N2581 Secondary hyperparathyroidism of renal origin: Secondary | ICD-10-CM | POA: Diagnosis not present

## 2022-06-08 DIAGNOSIS — D689 Coagulation defect, unspecified: Secondary | ICD-10-CM | POA: Diagnosis not present

## 2022-06-08 MED ORDER — INSULIN ASPART 100 UNIT/ML IJ SOLN: 10.0000 [IU] | Freq: Three times a day (TID) | INTRAMUSCULAR | 0 refills | Status: DC

## 2022-06-08 NOTE — Telephone Encounter (Signed)
Will forward to provider  

## 2022-06-08 NOTE — Telephone Encounter (Signed)
IT called saying he is completely out of his insulin.  CB@  [phone]

## 2022-06-08 NOTE — Telephone Encounter (Signed)
Pt is completely out of insulin since Saturday. Pt states he has been requesting since then but advised no recording showing. Pt was loud and rude and cussing because he says he goes thru this every time he runs out of insulin. I advised pt that I would send in 5m for him but he has to keep appt on 06/25/22 in order to get future refills. Pt verbalized understanding.   Requested Prescriptions  Pending Prescriptions Disp Refills  . insulin aspart (NOVOLOG) 100 UNIT/ML injection 10 mL 3    Sig: Inject 10 Units into the skin 3 (three) times daily before meals. For blood sugars 0-150 give 0 units of insulin, 151-200 give 2 units of insulin, 201-250 give 4 units, 251-300 give 6 units, 301-350 give 8 units, 351-400 give 10 units,> 400 give 12 units and call M.D. Discussed hypoglycemia protocol.     Endocrinology:  Diabetes - Insulins Failed - 06/08/2022 10:19 AM      Failed - HBA1C is between 0 and 7.9 and within 180 days    Hemoglobin A1C  Date Value Ref Range Status  08/22/2014 5.8 4.2 - 6.3 % Final    Comment:    The American Diabetes Association recommends that a primary goal of therapy should be <7% and that physicians should reevaluate the treatment regimen in patients with HbA1c values consistently >8%.    Hgb A1c MFr Bld  Date Value Ref Range Status  08/09/2021 9.3 (H) 4.8 - 5.6 % Final    Comment:    (NOTE) Pre diabetes:          5.7%-6.4%  Diabetes:              >6.4%  Glycemic control for   <7.0% adults with diabetes          Failed - Valid encounter within last 6 months    Recent Outpatient Visits          1 year ago H[place] of above [place], [place] [facility]   [facility]   1 year ago Type 1 diabetes mellitus with other circulatory complication ([place]   [facility]   2 years ago Type 1 diabetes mellitus with other circulatory complication [facility]   [facility]  E[facility]in Perna NP   3 years ago Need for Tdap vaccination   [facility] NP      Future Appointments            In 2 weeks EOletta LamasMMilford Cage NP [facility]

## 2022-06-09 ENCOUNTER — Other Ambulatory Visit (INDEPENDENT_AMBULATORY_CARE_PROVIDER_SITE_OTHER): Payer: Self-pay | Admitting: Primary Care

## 2022-06-09 MED ORDER — GABAPENTIN 100 MG PO CAPS
100.0000 mg | ORAL_CAPSULE | Freq: Three times a day (TID) | ORAL | 2 refills | Status: DC
Start: 2022-06-09 — End: 2022-06-09

## 2022-06-10 DIAGNOSIS — D689 Coagulation defect, unspecified: Secondary | ICD-10-CM | POA: Diagnosis not present

## 2022-06-10 DIAGNOSIS — N186 End stage renal disease: Secondary | ICD-10-CM | POA: Diagnosis not present

## 2022-06-10 DIAGNOSIS — N2581 Secondary hyperparathyroidism of renal origin: Secondary | ICD-10-CM | POA: Diagnosis not present

## 2022-06-10 DIAGNOSIS — T8249XA Other complication of vascular dialysis catheter, initial encounter: Secondary | ICD-10-CM | POA: Diagnosis not present

## 2022-06-10 DIAGNOSIS — Z992 Dependence on renal dialysis: Secondary | ICD-10-CM | POA: Diagnosis not present

## 2022-06-11 ENCOUNTER — Other Ambulatory Visit: Payer: Self-pay | Admitting: *Deleted

## 2022-06-11 DIAGNOSIS — N186 End stage renal disease: Secondary | ICD-10-CM

## 2022-06-12 ENCOUNTER — Ambulatory Visit (INDEPENDENT_AMBULATORY_CARE_PROVIDER_SITE_OTHER): Payer: Medicare HMO

## 2022-06-12 ENCOUNTER — Encounter (INDEPENDENT_AMBULATORY_CARE_PROVIDER_SITE_OTHER): Payer: Self-pay

## 2022-06-12 DIAGNOSIS — D689 Coagulation defect, unspecified: Secondary | ICD-10-CM | POA: Diagnosis not present

## 2022-06-12 DIAGNOSIS — Z0000 Encounter for general adult medical examination without abnormal findings: Secondary | ICD-10-CM

## 2022-06-12 DIAGNOSIS — N186 End stage renal disease: Secondary | ICD-10-CM | POA: Diagnosis not present

## 2022-06-12 DIAGNOSIS — N2581 Secondary hyperparathyroidism of renal origin: Secondary | ICD-10-CM | POA: Diagnosis not present

## 2022-06-12 DIAGNOSIS — T8249XA Other complication of vascular dialysis catheter, initial encounter: Secondary | ICD-10-CM | POA: Diagnosis not present

## 2022-06-12 DIAGNOSIS — Z992 Dependence on renal dialysis: Secondary | ICD-10-CM | POA: Diagnosis not present

## 2022-06-12 NOTE — Progress Notes (Signed)
Subjective:   [name] is a 41 y.o. male who presents for an Initial Medicare Annual Wellness Visit. I connected with  [name] on 06/12/22 by a video and audio enabled telemedicine application and verified that I am speaking with the correct person using two identifiers.  Patient Location: Home  Provider Location: Home Office  I discussed the limitations of evaluation and management by telemedicine. The patient expressed understanding and agreed to proceed.     Objective:    Today's Vitals   There is no height or weight on file to calculate BMI.     11/20/2021    8:53 AM 05/20/2021    1:00 AM 05/19/2021   12:50 PM 12/14/2020    2:11 AM 10/29/2020    3:32 AM 10/28/2020   11:51 AM 10/15/2020   10:00 PM  Advanced Directives  Does Patient Have a Medical Advance Directive? No No No No No No No  Would patient like information on creating a medical advance directive? No - Patient declined No - Patient declined No - Patient declined  Yes (ED - Information included in AVS) No - Patient declined No - Patient declined    Current Medications (verified) Outpatient Encounter Medications as of 06/12/2022  Medication Sig   calcitRIOL (ROCALTROL) 0.5 MCG capsule Take 3 capsules (1.5 mcg total) by mouth every Monday, Wednesday, and Friday with hemodialysis.   cinacalcet (SENSIPAR) 60 MG tablet Take 120 mg by mouth every evening.   gabapentin (NEURONTIN) 100 MG capsule TAKE 1 CAPSULE BY MOUTH THREE TIMES DAILY   insulin aspart (NOVOLOG) 100 UNIT/ML injection Inject 10 Units into the skin 3 (three) times daily before meals. For blood sugars 0-150 give 0 units of insulin, 151-200 give 2 units of insulin, 201-250 give 4 units, 251-300 give 6 units, 301-350 give 8 units, 351-400 give 10 units,> 400 give 12 units and call M.D. Discussed hypoglycemia protocol.   multivitamin (RENA-VIT) TABS tablet Take 1 tablet by mouth at bedtime.   sevelamer carbonate (RENVELA) 800 MG tablet Take 2,400-4,000 mg  by mouth See admin instructions. Take 4,000 mg (5 tablets) by mouth with meals and 2,400 mg (3 tablets) with snacks   No facility-administered encounter medications on file as of 06/12/2022.    Allergies (verified) Patient has no known allergies.   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 Filetted toe flap metatarsal resection. Debridement Plantar Foot wound;  Surgeon: Evelina Bucy, DPM;  Location: [place];  Service: Podiatry;  Laterality: Right;   AMPUTATION Left 08/20/2018   Procedure: FIFTH METATARSAL BONE BIOPSY;  Surgeon: Evelina Bucy, DPM;  Location: [place];  Service: Podiatry;  Laterality: Left;   AMPUTATION Left 10/28/2018   Procedure: LEFT GREAT TOE AMPUTATION;  Surgeon: Evelina Bucy, DPM;  Location: [place];  Service: Podiatry;  Laterality: Left;   AMPUTATION Left 09/16/2020   Procedure: AMPUTATION BELOW KNEE;  Surgeon: Rosetta Posner, MD;  Location: [place] OR;  Service: Vascular;  Laterality: Left;   AMPUTATION Left 10/15/2020   Procedure: AMPUTATION ABOVE KNEE;  Surgeon: Elam Dutch, MD;  Location: [place];  Service: Vascular;  Laterality: Left;   APPLICATION OF WOUND VAC  02/02/2018   Procedure: APPLICATION OF WOUND VAC  Right Foot;  Surgeon: Evelina Bucy, DPM;  Location: [place];  Service: Podiatry;;   APPLICATION OF WOUND VAC Left 10/28/2018   Procedure: APPLICATION OF WOUND VAC LEFT TOE;  Surgeon: Evelina Bucy, DPM;  Location: [place];  Service: Podiatry;  Laterality: Left;    APPLICATION OF WOUND VAC Left 11/01/2018   Procedure: APPLICATION OF WOUND VAC;  Surgeon: Evelina Bucy, DPM;  Location: [place];  Service: Podiatry;  Laterality: Left;   AV FISTULA PLACEMENT     left arm.   AV FISTULA PLACEMENT Right 12/22/2016   Procedure: INSERTION OF ARTERIOVENOUS (AV) GORE-TEX GRAFT ARM;  Surgeon: Elam Dutch, MD;  Location: [place];  Service: Vascular;  Laterality: Right;   AV FISTULA PLACEMENT Left 05/26/2018   Procedure: INSERTION OF  ARTERIOVENOUS (AV) GORE-TEX GRAFT LEFT ARM;  Surgeon: Serafina Mitchell, MD;  Location: [place];  Service: Vascular;  Laterality: Left;   EYE SURGERY     I & D EXTREMITY Right 10/31/2017   Procedure: IRRIGATION AND DEBRIDEMENT RIGHT FOOT;  Surgeon: Evelina Bucy, DPM;  Location: [place];  Service: Podiatry;  Laterality: Right;   I & D EXTREMITY Left 08/20/2018   Procedure: IRRIGATION AND DEBRIDEMENT EXTREMITY WITH SECONDARY WOUND CLOSUREAND APPLICATION OF WOUND VAC LEFT FOOT;  Surgeon: Evelina Bucy, DPM;  Location: [place];  Service: Podiatry;  Laterality: Left;   I & D EXTREMITY Left 10/20/2018   Procedure: IRRIGATION AND DEBRIDEMENT LEFT FOOT  DEBRIDEMENT LATERAL FOOT WOUND;  Surgeon: Evelina Bucy, DPM;  Location: [place];  Service: Podiatry;  Laterality: Left;   I & D EXTREMITY Left 10/28/2018   Procedure: IRRIGATION AND DEBRIDEMENT LEFT TOE;  Surgeon: Evelina Bucy, DPM;  Location: [place];  Service: Podiatry;  Laterality: Left;   I & D EXTREMITY Left 09/14/2020   Procedure: IRRIGATION AND DEBRIDEMENT WRIST;  Surgeon: Leanora Cover, MD;  Location: [place];  Service: Orthopedics;  Laterality: Left;   INSERTION OF DIALYSIS CATHETER     Right subclavian   IR AV DIALY SHUNT INTRO NEEDLE/INTRACATH INITIAL W/PTA/IMG RIGHT Right 02/05/2018   IR FLUORO GUIDE CV LINE RIGHT  01/31/2020   IR FLUORO GUIDE CV LINE RIGHT  01/30/2022   IR PTA VENOUS EXCEPT DIALYSIS CIRCUIT  01/30/2022   IR THROMBECTOMY AV FISTULA W/THROMBOLYSIS/PTA INC/SHUNT/IMG LEFT Left  08/24/2018   IR THROMBECTOMY AV FISTULA W/THROMBOLYSIS/PTA INC/SHUNT/IMG LEFT Left 01/06/2019   IR US GUIDE VASC ACCESS LEFT  08/24/2018   IR US GUIDE VASC ACCESS RIGHT  02/05/2018   IR US GUIDE VASC ACCESS RIGHT  01/31/2020   IR VENOCAVAGRAM SVC  01/30/2022   IRRIGATION AND DEBRIDEMENT FOOT Right 10/23/2018   Procedure: Irrigation and Debridement to tendon, Left Foot;  Surgeon: Evelina Bucy, DPM;  Location: [place];  Service: Podiatry;  Laterality: Right;   IRRIGATION AND DEBRIDEMENT FOOT Left 11/01/2018   Procedure: IRRIGATION AND DEBRIDEMENT PARTIAL WOUND CLOSURE LOCAL TISSUE TRANSFER AND FLAP ROTATION, LEFT FOOT;  Surgeon: Evelina Bucy, DPM;  Location: [place];  Service: Podiatry;  Laterality: Left;   TRANSMETATARSAL AMPUTATION N/A 08/18/2018   Procedure: IRRIGATION AND DEBRIDEMENT OF LEFT 5TH TOE AND TRANSMETATARSAL, WITH PARTICAL LEFT  5TH TOE AND METATARSAL AMPUTATION, BONE BIOPSY, WOUND VAC APPLICATION.;  Surgeon: Evelina Bucy, DPM;  Location: [place];  Service: Podiatry;  Laterality: N/A;   UPPER EXTREMITY VENOGRAPHY N/A 11/16/2016   Procedure: Upper Extremity Venography - Right Central;  Surgeon: Elam Dutch, MD;  Location: [place] [facility];  Service: Cardiovascular;  Laterality: N/A;   UPPER EXTREMITY VENOGRAPHY N/A 05/25/2018   Procedure: UPPER EXTREMITY VENOGRAPHY - Bilateral;  Surgeon: Marty Heck, MD;  Location: [place] [facility];  Service: Cardiovascular;  Laterality: N/A;   Family History  Problem Relation Age of Onset   Diabetes Mellitus II Other    Diabetes Father    Renal Disease Father        ESRD   Social History   Socioeconomic History   Marital status: Single    Spouse name: Not on file   Number of children: Not on file   Years of education: Not on file   Highest education level: Not on file  Occupational History   Not on file  Tobacco Use   Smoking status: Never   Smokeless tobacco: Never  Vaping Use   Vaping Use: Never used  Substance and  Sexual Activity   Alcohol use: No   Drug use: No   Sexual activity: Never  Other Topics Concern   Not on file  Social History Narrative   Not on file   Social Determinants of Health   Financial Resource Strain: Not on file  Food Insecurity: Food Insecurity Present (06/12/2022)   Hunger Vital Sign    Worried About [place] in the Last Year: Sometimes true    Ran Out of Food in the Last Year: Sometimes true  Transportation Needs: No Transportation Needs (06/12/2022)   PRAPARE - Hydrologist (Medical): No    Lack of Transportation (Non-Medical): No  Physical Activity: Not on file  Stress: Not on file  Social Connections: Not on file    Tobacco Counseling Counseling given: Not Answered   Clinical Intake:  Pre-visit preparation completed: Yes  Pain : No/denies pain     Diabetes: Yes CBG done?: No Did pt. bring in CBG monitor from home?: No  How often do you need to have someone help you when you read instructions, pamphlets, or other written materials from your doctor or pharmacy?: 1 - Never What is the last grade level you completed in school?: 9th grade  Diabetic?yes   Interpreter Needed?: No      Activities of Daily Living    08/11/2021    3:00 PM  In your present state of health, do you have any difficulty performing the following activities:  Hearing? 0  Vision? 0  Difficulty concentrating or making decisions? 0  Walking or climbing stairs? 1  Dressing or bathing? 1  Doing errands, shopping? 1           Immunizations Immunization History  Administered Date(s) Administered   Hepatitis B, adult 06/30/2012, 09/19/2012, 10/20/2012, 11/21/2012, 12/15/2012   Influenza,inj,quad, With Preservative 07/01/2018    TDAP status: Due, Education has been provided regarding the importance of this vaccine. Advised may receive this vaccine at local pharmacy or Health Dept. Aware to provide a copy of the vaccination record  if obtained from local pharmacy or Health Dept. Verbalized acceptance and understanding.  Flu Vaccine status: Due, Education has been provided regarding the importance of this vaccine. Advised may receive this vaccine at local pharmacy or Health Dept.  Aware to provide a copy of the vaccination record if obtained from local pharmacy or Health Dept. Verbalized acceptance and understanding.  Pneumococcal vaccine status: Due, Education has been provided regarding the importance of this vaccine. Advised may receive this vaccine at local pharmacy or Health Dept. Aware to provide a copy of the vaccination record if obtained from local pharmacy or Health Dept. Verbalized acceptance and understanding.  Covid-19 vaccine status: Information provided on how to obtain vaccines.   Qualifies for Shingles Vaccine? No   Zostavax completed No   Shingrix Completed?: No.    Education has been provided regarding the importance of this vaccine. Patient has been advised to call insurance company to determine out of pocket expense if they have not yet received this vaccine. Advised may also receive vaccine at local pharmacy or Health Dept. Verbalized acceptance and understanding.  Screening Tests Health Maintenance  Topic Date Due   COVID-19 Vaccine (1) Never done   FOOT EXAM  Never done   Hepatitis C Screening  Never done   TETANUS/TDAP  Never done   HEMOGLOBIN A1C  02/06/2022   INFLUENZA VACCINE  04/28/2022   OPHTHALMOLOGY EXAM  10/31/2022   HIV Screening  Completed   HPV VACCINES  Aged Out    Health Maintenance  Health Maintenance Due  Topic Date Due   COVID-19 Vaccine (1) Never done   FOOT EXAM  Never done   Hepatitis C Screening  Never done   TETANUS/TDAP  Never done   HEMOGLOBIN A1C  02/06/2022   INFLUENZA VACCINE  04/28/2022    Colorectal cancer screening: Referral to GI placed colonoscopy. Pt aware the office will call re: appt.  Lung Cancer Screening: (Low Dose CT Chest recommended if Age  52-80 years, 30 pack-year currently smoking OR have quit w/in 15years.) does qualify.   Lung Cancer Screening Referral: n/a  Additional Screening:  Hepatitis C Screening: does qualify; Completed n/a  Vision Screening: Recommended annual ophthalmology exams for early detection of glaucoma and other disorders of the eye. Is the patient up to date with their annual eye exam?  Yes  Who is the provider or what is the name of the office in which the patient attends annual eye exams? N/a If pt is not established with a provider, would they like to be referred to a provider to establish care? No .   Dental Screening: Recommended annual dental exams for proper oral hygiene  Community Resource Referral / Chronic Care Management: CRR required this visit?  no   CCM required this visit?  No      Plan:     I have personally reviewed and noted the following in the patient's chart:   Medical and social history Use of alcohol, tobacco or illicit drugs  Current medications and supplements including opioid prescriptions. Patient is not currently taking opioid prescriptions.  Functional ability and status Nutritional status Physical activity Advanced directives List of other physicians Hospitalizations, surgeries, and ER visits in previous 12 months Vitals Screenings to include cognitive, depression, and falls Referrals and appointments  In addition, I have reviewed and discussed with patient certain preventive protocols, quality metrics, and best practice recommendations. A written personalized care plan for preventive services as well as general preventive health recommendations were provided to patient.     Debbora Dus, [place]   06/12/2022   Nurse Notes: called patient during his dialysis appointment. He declined ofer to reschedule. Patient started answering questions, half way through patient had stopped talking. Called patient  back no answer. Initially had no interest in  completing.

## 2022-06-12 NOTE — Patient Instructions (Signed)
Health Maintenance, Male Adopting a healthy lifestyle and getting preventive care are important in promoting health and wellness. Ask your health care provider about: The right schedule for you to have regular tests and exams. Things you can do on your own to prevent diseases and keep yourself healthy. What should I know about diet, weight, and exercise? Eat a healthy diet  Eat a diet that includes plenty of vegetables, fruits, low-fat dairy products, and lean protein. Do not eat a lot of foods that are high in solid fats, added sugars, or sodium. Maintain a healthy weight Body mass index (BMI) is a measurement that can be used to identify possible weight problems. It estimates body fat based on height and weight. Your health care provider can help determine your BMI and help you achieve or maintain a healthy weight. Get regular exercise Get regular exercise. This is one of the most important things you can do for your health. Most adults should: Exercise for at least 150 minutes each week. The exercise should increase your heart rate and make you sweat (moderate-intensity exercise). Do strengthening exercises at least twice a week. This is in addition to the moderate-intensity exercise. Spend less time sitting. Even light physical activity can be beneficial. Watch cholesterol and blood lipids Have your blood tested for lipids and cholesterol at 43 years of age, then have this test every 5 years. You may need to have your cholesterol levels checked more often if: Your lipid or cholesterol levels are high. You are older than 43 years of age. You are at high risk for heart disease. What should I know about cancer screening? Many types of cancers can be detected early and may often be prevented. Depending on your health history and family history, you may need to have cancer screening at various ages. This may include screening for: Colorectal cancer. Prostate cancer. Skin cancer. Lung  cancer. What should I know about heart disease, diabetes, and high blood pressure? Blood pressure and heart disease High blood pressure causes heart disease and increases the risk of stroke. This is more likely to develop in people who have high blood pressure readings or are overweight. Talk with your health care provider about your target blood pressure readings. Have your blood pressure checked: Every 3-5 years if you are 18-39 years of age. Every year if you are 40 years old or older. If you are between the ages of 65 and 75 and are a current or former smoker, ask your health care provider if you should have a one-time screening for abdominal aortic aneurysm (AAA). Diabetes Have regular diabetes screenings. This checks your fasting blood sugar level. Have the screening done: Once every three years after age 45 if you are at a normal weight and have a low risk for diabetes. More often and at a younger age if you are overweight or have a high risk for diabetes. What should I know about preventing infection? Hepatitis B If you have a higher risk for hepatitis B, you should be screened for this virus. Talk with your health care provider to find out if you are at risk for hepatitis B infection. Hepatitis C Blood testing is recommended for: Everyone born from 1945 through 1965. Anyone with known risk factors for hepatitis C. Sexually transmitted infections (STIs) You should be screened each year for STIs, including gonorrhea and chlamydia, if: You are sexually active and are younger than 43 years of age. You are older than 43 years of age and your   health care provider tells you that you are at risk for this type of infection. Your sexual activity has changed since you were last screened, and you are at increased risk for chlamydia or gonorrhea. Ask your health care provider if you are at risk. Ask your health care provider about whether you are at high risk for HIV. Your health care provider  may recommend a prescription medicine to help prevent HIV infection. If you choose to take medicine to prevent HIV, you should first get tested for HIV. You should then be tested every 3 months for as long as you are taking the medicine. Follow these instructions at home: Alcohol use Do not drink alcohol if your health care provider tells you not to drink. If you drink alcohol: Limit how much you have to 0-2 drinks a day. Know how much alcohol is in your drink. In the [place] one drink equals one 12 oz bottle of beer (355 mL), one 5 oz glass of wine (148 mL), or one 1 oz glass of hard liquor (44 mL). Lifestyle Do not use any products that contain nicotine or tobacco. These products include cigarettes, chewing tobacco, and vaping devices, such as e-cigarettes. If you need help quitting, ask your health care provider. Do not use street drugs. Do not share needles. Ask your health care provider for help if you need support or information about quitting drugs. General instructions Schedule regular health, dental, and eye exams. Stay current with your vaccines. Tell your health care provider if: You often feel depressed. You have ever been abused or do not feel safe at home. Summary Adopting a healthy lifestyle and getting preventive care are important in promoting health and wellness. Follow your health care provider's instructions about healthy diet, exercising, and getting tested or screened for diseases. Follow your health care provider's instructions on monitoring your cholesterol and blood pressure. This information is not intended to replace advice given to you by your health care provider. Make sure you discuss any questions you have with your health care provider. Document Revised: 02/03/2021 Document Reviewed: 02/03/2021 Elsevier Patient Education  2023 Elsevier Inc.  

## 2022-06-15 DIAGNOSIS — T8249XA Other complication of vascular dialysis catheter, initial encounter: Secondary | ICD-10-CM | POA: Diagnosis not present

## 2022-06-15 DIAGNOSIS — Z992 Dependence on renal dialysis: Secondary | ICD-10-CM | POA: Diagnosis not present

## 2022-06-15 DIAGNOSIS — N186 End stage renal disease: Secondary | ICD-10-CM | POA: Diagnosis not present

## 2022-06-15 DIAGNOSIS — D689 Coagulation defect, unspecified: Secondary | ICD-10-CM | POA: Diagnosis not present

## 2022-06-15 DIAGNOSIS — N2581 Secondary hyperparathyroidism of renal origin: Secondary | ICD-10-CM | POA: Diagnosis not present

## 2022-06-17 DIAGNOSIS — T8249XA Other complication of vascular dialysis catheter, initial encounter: Secondary | ICD-10-CM | POA: Diagnosis not present

## 2022-06-17 DIAGNOSIS — N186 End stage renal disease: Secondary | ICD-10-CM | POA: Diagnosis not present

## 2022-06-17 DIAGNOSIS — D689 Coagulation defect, unspecified: Secondary | ICD-10-CM | POA: Diagnosis not present

## 2022-06-17 DIAGNOSIS — N2581 Secondary hyperparathyroidism of renal origin: Secondary | ICD-10-CM | POA: Diagnosis not present

## 2022-06-17 DIAGNOSIS — Z992 Dependence on renal dialysis: Secondary | ICD-10-CM | POA: Diagnosis not present

## 2022-06-19 DIAGNOSIS — N2581 Secondary hyperparathyroidism of renal origin: Secondary | ICD-10-CM | POA: Diagnosis not present

## 2022-06-19 DIAGNOSIS — D689 Coagulation defect, unspecified: Secondary | ICD-10-CM | POA: Diagnosis not present

## 2022-06-19 DIAGNOSIS — Z992 Dependence on renal dialysis: Secondary | ICD-10-CM | POA: Diagnosis not present

## 2022-06-19 DIAGNOSIS — T8249XA Other complication of vascular dialysis catheter, initial encounter: Secondary | ICD-10-CM | POA: Diagnosis not present

## 2022-06-19 DIAGNOSIS — N186 End stage renal disease: Secondary | ICD-10-CM | POA: Diagnosis not present

## 2022-06-19 DIAGNOSIS — I10 Essential (primary) hypertension: Secondary | ICD-10-CM | POA: Diagnosis not present

## 2022-06-20 DIAGNOSIS — I10 Essential (primary) hypertension: Secondary | ICD-10-CM | POA: Diagnosis not present

## 2022-06-21 DIAGNOSIS — I10 Essential (primary) hypertension: Secondary | ICD-10-CM | POA: Diagnosis not present

## 2022-06-22 DIAGNOSIS — N186 End stage renal disease: Secondary | ICD-10-CM | POA: Diagnosis not present

## 2022-06-22 DIAGNOSIS — T8249XA Other complication of vascular dialysis catheter, initial encounter: Secondary | ICD-10-CM | POA: Diagnosis not present

## 2022-06-22 DIAGNOSIS — Z992 Dependence on renal dialysis: Secondary | ICD-10-CM | POA: Diagnosis not present

## 2022-06-22 DIAGNOSIS — D689 Coagulation defect, unspecified: Secondary | ICD-10-CM | POA: Diagnosis not present

## 2022-06-22 DIAGNOSIS — I10 Essential (primary) hypertension: Secondary | ICD-10-CM | POA: Diagnosis not present

## 2022-06-22 DIAGNOSIS — N2581 Secondary hyperparathyroidism of renal origin: Secondary | ICD-10-CM | POA: Diagnosis not present

## 2022-06-23 DIAGNOSIS — I10 Essential (primary) hypertension: Secondary | ICD-10-CM | POA: Diagnosis not present

## 2022-06-24 ENCOUNTER — Other Ambulatory Visit (INDEPENDENT_AMBULATORY_CARE_PROVIDER_SITE_OTHER): Payer: Self-pay | Admitting: Primary Care

## 2022-06-24 ENCOUNTER — Telehealth (INDEPENDENT_AMBULATORY_CARE_PROVIDER_SITE_OTHER): Payer: Self-pay | Admitting: Primary Care

## 2022-06-24 DIAGNOSIS — N2581 Secondary hyperparathyroidism of renal origin: Secondary | ICD-10-CM | POA: Diagnosis not present

## 2022-06-24 DIAGNOSIS — E118 Type 2 diabetes mellitus with unspecified complications: Secondary | ICD-10-CM

## 2022-06-24 DIAGNOSIS — D689 Coagulation defect, unspecified: Secondary | ICD-10-CM | POA: Diagnosis not present

## 2022-06-24 DIAGNOSIS — Z992 Dependence on renal dialysis: Secondary | ICD-10-CM | POA: Diagnosis not present

## 2022-06-24 DIAGNOSIS — T8249XA Other complication of vascular dialysis catheter, initial encounter: Secondary | ICD-10-CM | POA: Diagnosis not present

## 2022-06-24 DIAGNOSIS — I10 Essential (primary) hypertension: Secondary | ICD-10-CM | POA: Diagnosis not present

## 2022-06-24 DIAGNOSIS — N186 End stage renal disease: Secondary | ICD-10-CM | POA: Diagnosis not present

## 2022-06-24 MED ORDER — INSULIN REGULAR HUMAN 100 UNIT/ML IJ SOLN: 12.0000 [IU] | Freq: Three times a day (TID) | INTRAMUSCULAR | 1 refills | Status: DC

## 2022-06-24 MED ORDER — INSULIN REGULAR HUMAN 100 UNIT/ML IJ SOLN: INTRAMUSCULAR | 1 refills | Status: DC

## 2022-06-24 MED ORDER — INSULIN REGULAR HUMAN 100 UNIT/ML IJ SOLN
12.0000 [IU] | Freq: Three times a day (TID) | INTRAMUSCULAR | 1 refills | Status: DC
Start: 2022-06-24 — End: 2022-06-24

## 2022-06-24 NOTE — Telephone Encounter (Signed)
Pt called in for assistance. Pt says that he made a mistake and dropped his medication insulin aspart (NOVOLOG) 100 UNIT/ML injection. Pt says that he spoke with his pharmacy and was told to reach out to his PCP office to request a refill. Pt is nervous because he is currently at dialysis and says that he will need it after he leave there. Pt would like to know if provider could send in a Rx to his pharmacy?     Pharmacy:  [place], [place] - [address] Phone:  [phone]  Fax:  [phone]

## 2022-06-24 NOTE — Telephone Encounter (Signed)
Requested medication (s) are due for refill today: yes  Requested medication (s) are on the active medication list: yes  Last refill:  06/08/22   Future visit scheduled: yes tomorrow  Notes to clinic:  pt dropped vial and called pharmacy, they advised him to see if PCP can send in refill. Pt has appt tomorrow. Please advise. Pt would like CB if not going to refill.      Requested Prescriptions  Pending Prescriptions Disp Refills   insulin aspart (NOVOLOG) 100 UNIT/ML injection 10 mL 0    Sig: Inject 10 Units into the skin 3 (three) times daily before meals. For blood sugars 0-150 give 0 units of insulin, 151-200 give 2 units of insulin, 201-250 give 4 units, 251-300 give 6 units, 301-350 give 8 units, 351-400 give 10 units,> 400 give 12 units and call M.D. Discussed hypoglycemia protocol.     Endocrinology:  Diabetes - Insulins Failed - 06/24/2022 12:13 PM      Failed - HBA1C is between 0 and 7.9 and within 180 days    Hemoglobin A1C  Date Value Ref Range Status  08/22/2014 5.8 4.2 - 6.3 % Final    Comment:    The American Diabetes Association recommends that a primary goal of therapy should be <7% and that physicians should reevaluate the treatment regimen in patients with HbA1c values consistently >8%.    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

## 2022-06-25 ENCOUNTER — Ambulatory Visit (HOSPITAL_COMMUNITY): Payer: Medicare HMO

## 2022-06-25 ENCOUNTER — Ambulatory Visit (HOSPITAL_COMMUNITY): Payer: Medicare HMO | Attending: Vascular Surgery

## 2022-06-25 ENCOUNTER — Ambulatory Visit (INDEPENDENT_AMBULATORY_CARE_PROVIDER_SITE_OTHER): Payer: Medicare HMO | Admitting: Primary Care

## 2022-06-25 ENCOUNTER — Ambulatory Visit: Payer: Medicare HMO | Admitting: Vascular Surgery

## 2022-06-25 ENCOUNTER — Encounter (INDEPENDENT_AMBULATORY_CARE_PROVIDER_SITE_OTHER): Payer: Self-pay | Admitting: Primary Care

## 2022-06-25 VITALS — BP 158/107 | HR 117 | Resp 16 | Ht 67.0 in | Wt 197.8 lb

## 2022-06-25 DIAGNOSIS — E1069 Type 1 diabetes mellitus with other specified complication: Secondary | ICD-10-CM | POA: Diagnosis not present

## 2022-06-25 DIAGNOSIS — F4325 Adjustment disorder with mixed disturbance of emotions and conduct: Secondary | ICD-10-CM | POA: Diagnosis not present

## 2022-06-25 DIAGNOSIS — I10 Essential (primary) hypertension: Secondary | ICD-10-CM | POA: Diagnosis not present

## 2022-06-25 DIAGNOSIS — Z992 Dependence on renal dialysis: Secondary | ICD-10-CM

## 2022-06-25 DIAGNOSIS — N186 End stage renal disease: Secondary | ICD-10-CM

## 2022-06-25 DIAGNOSIS — R69 Illness, unspecified: Secondary | ICD-10-CM | POA: Diagnosis not present

## 2022-06-25 DIAGNOSIS — R030 Elevated blood-pressure reading, without diagnosis of hypertension: Secondary | ICD-10-CM | POA: Diagnosis not present

## 2022-06-25 DIAGNOSIS — E118 Type 2 diabetes mellitus with unspecified complications: Secondary | ICD-10-CM

## 2022-06-25 LAB — POCT GLYCOSYLATED HEMOGLOBIN (HGB A1C): HbA1c, POC (controlled diabetic range): 10.6 % — AB (ref 0.0–7.0)

## 2022-06-25 LAB — GLUCOSE, POCT (MANUAL RESULT ENTRY)
POC Glucose: >500 mg/dl (ref 70–99)
POC Glucose: >500 mg/dl (ref 70–99)

## 2022-06-25 MED ORDER — INSULIN ASPART 100 UNIT/ML IJ SOLN
20.0000 [IU] | Freq: Once | INTRAMUSCULAR | Status: AC
  Administered 2022-06-25: 20 [IU] via SUBCUTANEOUS

## 2022-06-26 DIAGNOSIS — Z992 Dependence on renal dialysis: Secondary | ICD-10-CM | POA: Diagnosis not present

## 2022-06-26 DIAGNOSIS — N186 End stage renal disease: Secondary | ICD-10-CM | POA: Diagnosis not present

## 2022-06-26 DIAGNOSIS — N2581 Secondary hyperparathyroidism of renal origin: Secondary | ICD-10-CM | POA: Diagnosis not present

## 2022-06-26 DIAGNOSIS — I10 Essential (primary) hypertension: Secondary | ICD-10-CM | POA: Diagnosis not present

## 2022-06-26 DIAGNOSIS — D689 Coagulation defect, unspecified: Secondary | ICD-10-CM | POA: Diagnosis not present

## 2022-06-26 DIAGNOSIS — T8249XA Other complication of vascular dialysis catheter, initial encounter: Secondary | ICD-10-CM | POA: Diagnosis not present

## 2022-06-27 DIAGNOSIS — N186 End stage renal disease: Secondary | ICD-10-CM | POA: Diagnosis not present

## 2022-06-27 DIAGNOSIS — I10 Essential (primary) hypertension: Secondary | ICD-10-CM | POA: Diagnosis not present

## 2022-06-27 DIAGNOSIS — I129 Hypertensive chronic kidney disease with stage 1 through stage 4 chronic kidney disease, or unspecified chronic kidney disease: Secondary | ICD-10-CM | POA: Diagnosis not present

## 2022-06-27 DIAGNOSIS — Z992 Dependence on renal dialysis: Secondary | ICD-10-CM | POA: Diagnosis not present

## 2022-06-27 NOTE — Progress Notes (Signed)
Established Patient Office Visit  Subjective   Patient ID: [name], male    DOB: December 20, 1980  Age: 41 y.o. MRN: [id number]  Chief Complaint  Patient presents with   Diabetes    Pt has been out of insulin since yesterday     HPI [name] is a 41 year old male who has type 1 diabetes, had a left AKA is seen wearing his prosthesis that he absolutely dislikes.  He states is bulky at the top and looks like a toothpick on the bottom also causing difficulty ambulating and having to use a walker for stability.  He is on dialysis 3 days a week and today he appears more fuller in the face than normal.  His glucose is greater than 500 unable to read.  Initially gave him 20 units of regular insulin.  He has been out of insulin since yesterday.  Unfortunately he picked up his insulin and dropped it the valve broke and he tried to refill it but due to insurance she has to wait 30 days.  PCP sent in insulin regular with sliding scale that he was not aware of.  However explained to him writer would send in insulin to cover the days he would have to wait to get his Novolin insulin.  Unable to hydrate due to chronic kidney disease.  Patient does not want to go to the emergency room.  He denies increased thirst, hunger, or urination.  Blood pressure is extremely elevated but he is also very stressed and upset.  He is not on any blood pressure medication.  Prior blood pressure readings have been normal or hypotensive. Patient has No headache, No chest pain, No abdominal pain - No Nausea, No new weakness tingling or numbness, No Cough - shortness of breath  ROS Comprehensive ROS Pertinent positive and negative noted in HPI     Objective:   BP (!) 158/107   Pulse (!) 117   Resp 16   Ht '5\' 7"'$  (1.702 m)   Wt 197 lb 12.8 oz (89.7 kg)   SpO2 97%   BMI 30.98 kg/m  BP Readings from Last 3 Encounters:  06/25/22 (!) 158/107  04/17/22 104/62  01/21/22 98/60    Physical Exam General: No apparent  distress. Face full "puffY'' Eyes: Extraocular eye movements intact, pupils equal and round. Neck: Supple, trachea midline. Thyroid: No enlargement, mobile without fixation, no tenderness. Cardiovascular: Regular rhythm and rate, no murmur, normal radial pulses. Respiratory: Normal respiratory effort, clear to auscultation. Gastrointestinal: Normal pitch active bowel sounds, nontender abdomen without distention or appreciable hepatomegaly. Musculoskeletal: left AKA. Normal upper ROM. Gait instability  Skin: Appropriate warmth, no visible rash. Mental status: Alert, conversant, speech clear, thought logical, appropriate mood and affect, no hallucinations or delusions evident. Hematologic/lymphatic: No cervical adenopathy, no visible ecchymoses.   Results for orders placed or performed in visit on 06/25/22  POCT glucose (manual entry)  Result Value Ref Range   POC Glucose >500 70 - 99 mg/dl  POCT glycosylated hemoglobin (Hb A1C)  Result Value Ref Range   Hemoglobin A1C     HbA1c POC (<> result, manual entry)     HbA1c, POC (prediabetic range)     HbA1c, POC (controlled diabetic range) 10.6 (A) 0.0 - 7.0 %  POCT glucose (manual entry)  Result Value Ref Range   POC Glucose >500 70 - 99 mg/dl   Labs are received from at dialysis  The ASCVD Risk score (Arnett DK, et al., 2019) failed to  calculate for the following reasons:   Cannot find a previous HDL lab   Cannot find a previous total cholesterol lab    Assessment & Plan:   Lorrie was seen today for diabetes.  Diagnoses and all orders for this visit:  Type 1 diabetes mellitus with complication, on long-term current use of insulin ([place]) patient has been noncompliant manages and controls his own diabetes.  Today his A1c is 10.  Patient is very much aware of complications with uncontrolled diabetes which she is experiencing AKA and dialysis.  Blood glucose - , the target glucose range is 70 to 180 mg/dL (3.9 to 10 mmol/L), while  minimizing time in hypoglycemia (<70 mg/dL [3.9 mmol/L]) and hyperglycemia (>180 mg/dL [10 mmol/L]) and avoiding glucose readings of <54 mg/dL (3 mmol/L) and >250 mg/dL (13.9 mmol/L)  The actual mean blood glucose levels associated with A1C levels 6.50 to 6.99 percent in type 1 diabetes are 144, 140, and 154 mg/dL fasting, pre-meals, and pre-bed, respectively. Controlled A1C </= 7 . Up to date  -     POCT glucose (manual entry) -     POCT glycosylated hemoglobin (Hb A1C) -     Cancel: HCV Ab w Reflex to Quant PCR -     insulin aspart (novoLOG) injection 20 Units blood sugar did not respond still unable to read glucose level other than high.  Patient will not go to the emergency room. -     POCT glucose (manual entry) Patient would benefit from a endocrinologist will place the referral however patient will probably deny an appointment.  ESRD on dialysis [facility]) Followed by nephrology on dialysis 3 days a week.  Tuesday Thursday and Saturday  Adjustment disorder with mixed disturbance of emotions and conduct Patient has a very hostile mood changes.  And easily to verbalize his anger not in the best way nor is it receive well more like a threatening nature.  However, PCP/writer can question who are you talking to if you do not change her tone of voice or your verbiage this conversation will end.  Patient immediately apologizes and states I am sorry auntie (PCP)  He could very much benefit from therapy will refer to clinical social worker  Elevated blood pressure reading Reviewed previous blood pressures this 1 today is elevated secondary to a lot going on in the family and increased stress and anxiety which she created on his self.   Patient to schedule follow-up appointment   Kerin Perna, NP

## 2022-06-28 DIAGNOSIS — I10 Essential (primary) hypertension: Secondary | ICD-10-CM | POA: Diagnosis not present

## 2022-06-29 DIAGNOSIS — D689 Coagulation defect, unspecified: Secondary | ICD-10-CM | POA: Diagnosis not present

## 2022-06-29 DIAGNOSIS — N186 End stage renal disease: Secondary | ICD-10-CM | POA: Diagnosis not present

## 2022-06-29 DIAGNOSIS — T8249XA Other complication of vascular dialysis catheter, initial encounter: Secondary | ICD-10-CM | POA: Diagnosis not present

## 2022-06-29 DIAGNOSIS — I10 Essential (primary) hypertension: Secondary | ICD-10-CM | POA: Diagnosis not present

## 2022-06-29 DIAGNOSIS — Z992 Dependence on renal dialysis: Secondary | ICD-10-CM | POA: Diagnosis not present

## 2022-06-29 DIAGNOSIS — N2581 Secondary hyperparathyroidism of renal origin: Secondary | ICD-10-CM | POA: Diagnosis not present

## 2022-06-30 DIAGNOSIS — Z992 Dependence on renal dialysis: Secondary | ICD-10-CM | POA: Diagnosis not present

## 2022-06-30 DIAGNOSIS — N186 End stage renal disease: Secondary | ICD-10-CM | POA: Diagnosis not present

## 2022-06-30 DIAGNOSIS — D689 Coagulation defect, unspecified: Secondary | ICD-10-CM | POA: Diagnosis not present

## 2022-06-30 DIAGNOSIS — I10 Essential (primary) hypertension: Secondary | ICD-10-CM | POA: Diagnosis not present

## 2022-06-30 DIAGNOSIS — T8249XA Other complication of vascular dialysis catheter, initial encounter: Secondary | ICD-10-CM | POA: Diagnosis not present

## 2022-06-30 DIAGNOSIS — N2581 Secondary hyperparathyroidism of renal origin: Secondary | ICD-10-CM | POA: Diagnosis not present

## 2022-07-01 DIAGNOSIS — Z992 Dependence on renal dialysis: Secondary | ICD-10-CM | POA: Diagnosis not present

## 2022-07-01 DIAGNOSIS — I10 Essential (primary) hypertension: Secondary | ICD-10-CM | POA: Diagnosis not present

## 2022-07-01 DIAGNOSIS — T8249XA Other complication of vascular dialysis catheter, initial encounter: Secondary | ICD-10-CM | POA: Diagnosis not present

## 2022-07-01 DIAGNOSIS — D689 Coagulation defect, unspecified: Secondary | ICD-10-CM | POA: Diagnosis not present

## 2022-07-01 DIAGNOSIS — N186 End stage renal disease: Secondary | ICD-10-CM | POA: Diagnosis not present

## 2022-07-01 DIAGNOSIS — N2581 Secondary hyperparathyroidism of renal origin: Secondary | ICD-10-CM | POA: Diagnosis not present

## 2022-07-02 DIAGNOSIS — Z89612 Acquired absence of left leg above knee: Secondary | ICD-10-CM | POA: Diagnosis not present

## 2022-07-02 DIAGNOSIS — I10 Essential (primary) hypertension: Secondary | ICD-10-CM | POA: Diagnosis not present

## 2022-07-03 DIAGNOSIS — T8249XA Other complication of vascular dialysis catheter, initial encounter: Secondary | ICD-10-CM | POA: Diagnosis not present

## 2022-07-03 DIAGNOSIS — D689 Coagulation defect, unspecified: Secondary | ICD-10-CM | POA: Diagnosis not present

## 2022-07-03 DIAGNOSIS — N2581 Secondary hyperparathyroidism of renal origin: Secondary | ICD-10-CM | POA: Diagnosis not present

## 2022-07-03 DIAGNOSIS — N186 End stage renal disease: Secondary | ICD-10-CM | POA: Diagnosis not present

## 2022-07-03 DIAGNOSIS — I10 Essential (primary) hypertension: Secondary | ICD-10-CM | POA: Diagnosis not present

## 2022-07-03 DIAGNOSIS — Z992 Dependence on renal dialysis: Secondary | ICD-10-CM | POA: Diagnosis not present

## 2022-07-04 DIAGNOSIS — I10 Essential (primary) hypertension: Secondary | ICD-10-CM | POA: Diagnosis not present

## 2022-07-05 DIAGNOSIS — I10 Essential (primary) hypertension: Secondary | ICD-10-CM | POA: Diagnosis not present

## 2022-07-06 DIAGNOSIS — N2581 Secondary hyperparathyroidism of renal origin: Secondary | ICD-10-CM | POA: Diagnosis not present

## 2022-07-06 DIAGNOSIS — Z992 Dependence on renal dialysis: Secondary | ICD-10-CM | POA: Diagnosis not present

## 2022-07-06 DIAGNOSIS — T8249XA Other complication of vascular dialysis catheter, initial encounter: Secondary | ICD-10-CM | POA: Diagnosis not present

## 2022-07-06 DIAGNOSIS — D689 Coagulation defect, unspecified: Secondary | ICD-10-CM | POA: Diagnosis not present

## 2022-07-06 DIAGNOSIS — N186 End stage renal disease: Secondary | ICD-10-CM | POA: Diagnosis not present

## 2022-07-06 DIAGNOSIS — I10 Essential (primary) hypertension: Secondary | ICD-10-CM | POA: Diagnosis not present

## 2022-07-07 DIAGNOSIS — I10 Essential (primary) hypertension: Secondary | ICD-10-CM | POA: Diagnosis not present

## 2022-07-08 DIAGNOSIS — N2581 Secondary hyperparathyroidism of renal origin: Secondary | ICD-10-CM | POA: Diagnosis not present

## 2022-07-08 DIAGNOSIS — D689 Coagulation defect, unspecified: Secondary | ICD-10-CM | POA: Diagnosis not present

## 2022-07-08 DIAGNOSIS — Z992 Dependence on renal dialysis: Secondary | ICD-10-CM | POA: Diagnosis not present

## 2022-07-08 DIAGNOSIS — T8249XA Other complication of vascular dialysis catheter, initial encounter: Secondary | ICD-10-CM | POA: Diagnosis not present

## 2022-07-08 DIAGNOSIS — I10 Essential (primary) hypertension: Secondary | ICD-10-CM | POA: Diagnosis not present

## 2022-07-08 DIAGNOSIS — N186 End stage renal disease: Secondary | ICD-10-CM | POA: Diagnosis not present

## 2022-07-09 DIAGNOSIS — I10 Essential (primary) hypertension: Secondary | ICD-10-CM | POA: Diagnosis not present

## 2022-07-10 ENCOUNTER — Telehealth: Payer: Self-pay | Admitting: *Deleted

## 2022-07-10 DIAGNOSIS — I10 Essential (primary) hypertension: Secondary | ICD-10-CM | POA: Diagnosis not present

## 2022-07-10 DIAGNOSIS — T8249XA Other complication of vascular dialysis catheter, initial encounter: Secondary | ICD-10-CM | POA: Diagnosis not present

## 2022-07-10 DIAGNOSIS — N2581 Secondary hyperparathyroidism of renal origin: Secondary | ICD-10-CM | POA: Diagnosis not present

## 2022-07-10 DIAGNOSIS — D689 Coagulation defect, unspecified: Secondary | ICD-10-CM | POA: Diagnosis not present

## 2022-07-10 DIAGNOSIS — N186 End stage renal disease: Secondary | ICD-10-CM | POA: Diagnosis not present

## 2022-07-10 DIAGNOSIS — Z992 Dependence on renal dialysis: Secondary | ICD-10-CM | POA: Diagnosis not present

## 2022-07-10 NOTE — Chronic Care Management (AMB) (Signed)
  Care Coordination   Note   07/10/2022 Name: TRAYTON SZABO MRN: [id number] DOB: 1981-08-09  [name] is a 41 y.o. year old male who sees Kerin Perna, NP for primary care. I reached out to [name] by phone today to offer care coordination services.  Mr. Hallas was given information about Care Coordination services today including:   The Care Coordination services include support from the care team which includes your Nurse Coordinator, Clinical Social Worker, or Pharmacist.  The Care Coordination team is here to help remove barriers to the health concerns and goals most important to you. Care Coordination services are voluntary, and the patient may decline or stop services at any time by request to their care team member.   Care Coordination Consent Status: Patient did not agree to participate in care coordination services at this time.    Encounter Outcome:  Pt. Refused  [place]  Direct Dial: [phone]

## 2022-07-11 DIAGNOSIS — I10 Essential (primary) hypertension: Secondary | ICD-10-CM | POA: Diagnosis not present

## 2022-07-12 DIAGNOSIS — I10 Essential (primary) hypertension: Secondary | ICD-10-CM | POA: Diagnosis not present

## 2022-07-13 DIAGNOSIS — I10 Essential (primary) hypertension: Secondary | ICD-10-CM | POA: Diagnosis not present

## 2022-07-13 DIAGNOSIS — D689 Coagulation defect, unspecified: Secondary | ICD-10-CM | POA: Diagnosis not present

## 2022-07-13 DIAGNOSIS — Z992 Dependence on renal dialysis: Secondary | ICD-10-CM | POA: Diagnosis not present

## 2022-07-13 DIAGNOSIS — N2581 Secondary hyperparathyroidism of renal origin: Secondary | ICD-10-CM | POA: Diagnosis not present

## 2022-07-13 DIAGNOSIS — T8249XA Other complication of vascular dialysis catheter, initial encounter: Secondary | ICD-10-CM | POA: Diagnosis not present

## 2022-07-13 DIAGNOSIS — N186 End stage renal disease: Secondary | ICD-10-CM | POA: Diagnosis not present

## 2022-07-14 DIAGNOSIS — I10 Essential (primary) hypertension: Secondary | ICD-10-CM | POA: Diagnosis not present

## 2022-07-15 DIAGNOSIS — D689 Coagulation defect, unspecified: Secondary | ICD-10-CM | POA: Diagnosis not present

## 2022-07-15 DIAGNOSIS — Z992 Dependence on renal dialysis: Secondary | ICD-10-CM | POA: Diagnosis not present

## 2022-07-15 DIAGNOSIS — I10 Essential (primary) hypertension: Secondary | ICD-10-CM | POA: Diagnosis not present

## 2022-07-15 DIAGNOSIS — N2581 Secondary hyperparathyroidism of renal origin: Secondary | ICD-10-CM | POA: Diagnosis not present

## 2022-07-15 DIAGNOSIS — T8249XA Other complication of vascular dialysis catheter, initial encounter: Secondary | ICD-10-CM | POA: Diagnosis not present

## 2022-07-15 DIAGNOSIS — N186 End stage renal disease: Secondary | ICD-10-CM | POA: Diagnosis not present

## 2022-07-16 ENCOUNTER — Telehealth (INDEPENDENT_AMBULATORY_CARE_PROVIDER_SITE_OTHER): Payer: Self-pay | Admitting: Primary Care

## 2022-07-16 DIAGNOSIS — E118 Type 2 diabetes mellitus with unspecified complications: Secondary | ICD-10-CM

## 2022-07-16 DIAGNOSIS — I10 Essential (primary) hypertension: Secondary | ICD-10-CM | POA: Diagnosis not present

## 2022-07-16 NOTE — Telephone Encounter (Signed)
Pt called, pt states that he is wanting Novolog and 70/30 prescribed back because it does better with pt's body. I advised pt that he currently has Novolin R and is same as Novolog. Pt asked that request be sent to [facility], NP and see what she says about those insulins. Advised I would send message back.

## 2022-07-17 DIAGNOSIS — N186 End stage renal disease: Secondary | ICD-10-CM | POA: Diagnosis not present

## 2022-07-17 DIAGNOSIS — I10 Essential (primary) hypertension: Secondary | ICD-10-CM | POA: Diagnosis not present

## 2022-07-17 DIAGNOSIS — T8249XA Other complication of vascular dialysis catheter, initial encounter: Secondary | ICD-10-CM | POA: Diagnosis not present

## 2022-07-17 DIAGNOSIS — N2581 Secondary hyperparathyroidism of renal origin: Secondary | ICD-10-CM | POA: Diagnosis not present

## 2022-07-17 DIAGNOSIS — D689 Coagulation defect, unspecified: Secondary | ICD-10-CM | POA: Diagnosis not present

## 2022-07-17 DIAGNOSIS — Z992 Dependence on renal dialysis: Secondary | ICD-10-CM | POA: Diagnosis not present

## 2022-07-17 NOTE — Telephone Encounter (Signed)
Will forward to provider  

## 2022-07-17 NOTE — Telephone Encounter (Signed)
Pt called back saying he is completely out of the medication has called three times and he says he needs the medication asap.  CB@  [phone]

## 2022-07-18 DIAGNOSIS — I10 Essential (primary) hypertension: Secondary | ICD-10-CM | POA: Diagnosis not present

## 2022-07-19 ENCOUNTER — Other Ambulatory Visit (INDEPENDENT_AMBULATORY_CARE_PROVIDER_SITE_OTHER): Payer: Self-pay | Admitting: Primary Care

## 2022-07-19 DIAGNOSIS — E118 Type 2 diabetes mellitus with unspecified complications: Secondary | ICD-10-CM

## 2022-07-19 DIAGNOSIS — I10 Essential (primary) hypertension: Secondary | ICD-10-CM | POA: Diagnosis not present

## 2022-07-19 MED ORDER — INSULIN ASPART 100 UNIT/ML IJ SOLN: 10.0000 [IU] | Freq: Three times a day (TID) | INTRAMUSCULAR | 3 refills | Status: DC

## 2022-07-20 DIAGNOSIS — T8249XA Other complication of vascular dialysis catheter, initial encounter: Secondary | ICD-10-CM | POA: Diagnosis not present

## 2022-07-20 DIAGNOSIS — I10 Essential (primary) hypertension: Secondary | ICD-10-CM | POA: Diagnosis not present

## 2022-07-20 DIAGNOSIS — Z992 Dependence on renal dialysis: Secondary | ICD-10-CM | POA: Diagnosis not present

## 2022-07-20 DIAGNOSIS — N2581 Secondary hyperparathyroidism of renal origin: Secondary | ICD-10-CM | POA: Diagnosis not present

## 2022-07-20 DIAGNOSIS — D689 Coagulation defect, unspecified: Secondary | ICD-10-CM | POA: Diagnosis not present

## 2022-07-20 DIAGNOSIS — N186 End stage renal disease: Secondary | ICD-10-CM | POA: Diagnosis not present

## 2022-07-21 DIAGNOSIS — I10 Essential (primary) hypertension: Secondary | ICD-10-CM | POA: Diagnosis not present

## 2022-07-22 ENCOUNTER — Telehealth (INDEPENDENT_AMBULATORY_CARE_PROVIDER_SITE_OTHER): Payer: Self-pay | Admitting: Primary Care

## 2022-07-22 DIAGNOSIS — T8249XA Other complication of vascular dialysis catheter, initial encounter: Secondary | ICD-10-CM | POA: Diagnosis not present

## 2022-07-22 DIAGNOSIS — I10 Essential (primary) hypertension: Secondary | ICD-10-CM | POA: Diagnosis not present

## 2022-07-22 DIAGNOSIS — Z992 Dependence on renal dialysis: Secondary | ICD-10-CM | POA: Diagnosis not present

## 2022-07-22 DIAGNOSIS — D689 Coagulation defect, unspecified: Secondary | ICD-10-CM | POA: Diagnosis not present

## 2022-07-22 DIAGNOSIS — N2581 Secondary hyperparathyroidism of renal origin: Secondary | ICD-10-CM | POA: Diagnosis not present

## 2022-07-22 DIAGNOSIS — N186 End stage renal disease: Secondary | ICD-10-CM | POA: Diagnosis not present

## 2022-07-22 NOTE — Telephone Encounter (Signed)
Copied from [place] [id number]. Topic: General - Other >> Jul 21, 2022  5:06 PM Cyndi Bender wrote: Reason for CRM: Pt stated he has not received a call back as requested. Pt asked that his call be returned asap today. Cb# [phone]

## 2022-07-23 ENCOUNTER — Other Ambulatory Visit (INDEPENDENT_AMBULATORY_CARE_PROVIDER_SITE_OTHER): Payer: Self-pay | Admitting: Primary Care

## 2022-07-23 DIAGNOSIS — I10 Essential (primary) hypertension: Secondary | ICD-10-CM | POA: Diagnosis not present

## 2022-07-23 NOTE — Telephone Encounter (Addendum)
Patient is very frustrated and would like a follow up call today regarding his insulin.   [place], [address] Phone:  [phone]  Fax:  [phone]

## 2022-07-23 NOTE — Telephone Encounter (Signed)
Provider is working on rx

## 2022-07-24 ENCOUNTER — Telehealth (INDEPENDENT_AMBULATORY_CARE_PROVIDER_SITE_OTHER): Payer: Self-pay | Admitting: Primary Care

## 2022-07-24 DIAGNOSIS — D689 Coagulation defect, unspecified: Secondary | ICD-10-CM | POA: Diagnosis not present

## 2022-07-24 DIAGNOSIS — T8249XA Other complication of vascular dialysis catheter, initial encounter: Secondary | ICD-10-CM | POA: Diagnosis not present

## 2022-07-24 DIAGNOSIS — Z992 Dependence on renal dialysis: Secondary | ICD-10-CM | POA: Diagnosis not present

## 2022-07-24 DIAGNOSIS — N186 End stage renal disease: Secondary | ICD-10-CM | POA: Diagnosis not present

## 2022-07-24 DIAGNOSIS — I10 Essential (primary) hypertension: Secondary | ICD-10-CM | POA: Diagnosis not present

## 2022-07-24 DIAGNOSIS — N2581 Secondary hyperparathyroidism of renal origin: Secondary | ICD-10-CM | POA: Diagnosis not present

## 2022-07-24 NOTE — Telephone Encounter (Signed)
Received a call from [facility]. Patient is trying to get his Rx filled today. He has spoken to his insurance, and was told the Rx could be filled. [name] has requested a call back form Juluis Mire today.

## 2022-07-24 NOTE — Telephone Encounter (Signed)
Copied from [place] [id number]. Topic: General - Other >> Jul 24, 2022  4:15 PM Leitha Schuller wrote: Pt requesting a cb from front office staff "asap"  Pt would not give details on nature of the call

## 2022-07-25 ENCOUNTER — Other Ambulatory Visit (INDEPENDENT_AMBULATORY_CARE_PROVIDER_SITE_OTHER): Payer: Self-pay | Admitting: Primary Care

## 2022-07-25 DIAGNOSIS — I10 Essential (primary) hypertension: Secondary | ICD-10-CM | POA: Diagnosis not present

## 2022-07-25 MED ORDER — NOVOLIN 70/30 (70-30) 100 UNIT/ML ~~LOC~~ SUSP: 10.0000 [IU] | Freq: Two times a day (BID) | SUBCUTANEOUS | 6 refills | Status: DC

## 2022-07-25 NOTE — Telephone Encounter (Signed)
[name] and 70 3010 units twice daily call and left message with patient

## 2022-07-26 DIAGNOSIS — I10 Essential (primary) hypertension: Secondary | ICD-10-CM | POA: Diagnosis not present

## 2022-07-27 ENCOUNTER — Telehealth (INDEPENDENT_AMBULATORY_CARE_PROVIDER_SITE_OTHER): Payer: Self-pay | Admitting: Primary Care

## 2022-07-27 DIAGNOSIS — T8249XA Other complication of vascular dialysis catheter, initial encounter: Secondary | ICD-10-CM | POA: Diagnosis not present

## 2022-07-27 DIAGNOSIS — N186 End stage renal disease: Secondary | ICD-10-CM | POA: Diagnosis not present

## 2022-07-27 DIAGNOSIS — I10 Essential (primary) hypertension: Secondary | ICD-10-CM | POA: Diagnosis not present

## 2022-07-27 DIAGNOSIS — Z992 Dependence on renal dialysis: Secondary | ICD-10-CM | POA: Diagnosis not present

## 2022-07-27 DIAGNOSIS — D689 Coagulation defect, unspecified: Secondary | ICD-10-CM | POA: Diagnosis not present

## 2022-07-27 DIAGNOSIS — N2581 Secondary hyperparathyroidism of renal origin: Secondary | ICD-10-CM | POA: Diagnosis not present

## 2022-07-27 NOTE — Telephone Encounter (Signed)
Copied from [place] [phone] [phone]. Topic: General - Other >> Jul 24, 2022  5:25 PM Oley Balm E wrote: Reason for CRM: Pt called refused to identify himself as anything other than mr. R[place] but his phone number matches this chart. He says he needs to urgently speak to a nurse but refuses to disclose why and who he is. Declined to provide full name and birthday.

## 2022-07-27 NOTE — Telephone Encounter (Signed)
Provider has addressed this is

## 2022-07-28 DIAGNOSIS — I129 Hypertensive chronic kidney disease with stage 1 through stage 4 chronic kidney disease, or unspecified chronic kidney disease: Secondary | ICD-10-CM | POA: Diagnosis not present

## 2022-07-28 DIAGNOSIS — N186 End stage renal disease: Secondary | ICD-10-CM | POA: Diagnosis not present

## 2022-07-28 DIAGNOSIS — I10 Essential (primary) hypertension: Secondary | ICD-10-CM | POA: Diagnosis not present

## 2022-07-28 DIAGNOSIS — Z992 Dependence on renal dialysis: Secondary | ICD-10-CM | POA: Diagnosis not present

## 2022-07-29 DIAGNOSIS — T8249XA Other complication of vascular dialysis catheter, initial encounter: Secondary | ICD-10-CM | POA: Diagnosis not present

## 2022-07-29 DIAGNOSIS — D689 Coagulation defect, unspecified: Secondary | ICD-10-CM | POA: Diagnosis not present

## 2022-07-29 DIAGNOSIS — N186 End stage renal disease: Secondary | ICD-10-CM | POA: Diagnosis not present

## 2022-07-29 DIAGNOSIS — Z992 Dependence on renal dialysis: Secondary | ICD-10-CM | POA: Diagnosis not present

## 2022-07-29 DIAGNOSIS — N2581 Secondary hyperparathyroidism of renal origin: Secondary | ICD-10-CM | POA: Diagnosis not present

## 2022-07-29 DIAGNOSIS — I10 Essential (primary) hypertension: Secondary | ICD-10-CM | POA: Diagnosis not present

## 2022-07-30 DIAGNOSIS — I10 Essential (primary) hypertension: Secondary | ICD-10-CM | POA: Diagnosis not present

## 2022-07-31 DIAGNOSIS — Z992 Dependence on renal dialysis: Secondary | ICD-10-CM | POA: Diagnosis not present

## 2022-07-31 DIAGNOSIS — N186 End stage renal disease: Secondary | ICD-10-CM | POA: Diagnosis not present

## 2022-07-31 DIAGNOSIS — D689 Coagulation defect, unspecified: Secondary | ICD-10-CM | POA: Diagnosis not present

## 2022-07-31 DIAGNOSIS — T8249XA Other complication of vascular dialysis catheter, initial encounter: Secondary | ICD-10-CM | POA: Diagnosis not present

## 2022-07-31 DIAGNOSIS — I10 Essential (primary) hypertension: Secondary | ICD-10-CM | POA: Diagnosis not present

## 2022-07-31 DIAGNOSIS — N2581 Secondary hyperparathyroidism of renal origin: Secondary | ICD-10-CM | POA: Diagnosis not present

## 2022-08-01 DIAGNOSIS — I10 Essential (primary) hypertension: Secondary | ICD-10-CM | POA: Diagnosis not present

## 2022-08-02 DIAGNOSIS — I10 Essential (primary) hypertension: Secondary | ICD-10-CM | POA: Diagnosis not present

## 2022-08-03 DIAGNOSIS — T8249XA Other complication of vascular dialysis catheter, initial encounter: Secondary | ICD-10-CM | POA: Diagnosis not present

## 2022-08-03 DIAGNOSIS — N186 End stage renal disease: Secondary | ICD-10-CM | POA: Diagnosis not present

## 2022-08-03 DIAGNOSIS — I10 Essential (primary) hypertension: Secondary | ICD-10-CM | POA: Diagnosis not present

## 2022-08-03 DIAGNOSIS — N2581 Secondary hyperparathyroidism of renal origin: Secondary | ICD-10-CM | POA: Diagnosis not present

## 2022-08-03 DIAGNOSIS — D689 Coagulation defect, unspecified: Secondary | ICD-10-CM | POA: Diagnosis not present

## 2022-08-03 DIAGNOSIS — Z992 Dependence on renal dialysis: Secondary | ICD-10-CM | POA: Diagnosis not present

## 2022-08-04 DIAGNOSIS — I10 Essential (primary) hypertension: Secondary | ICD-10-CM | POA: Diagnosis not present

## 2022-08-05 DIAGNOSIS — N2581 Secondary hyperparathyroidism of renal origin: Secondary | ICD-10-CM | POA: Diagnosis not present

## 2022-08-05 DIAGNOSIS — N186 End stage renal disease: Secondary | ICD-10-CM | POA: Diagnosis not present

## 2022-08-05 DIAGNOSIS — Z992 Dependence on renal dialysis: Secondary | ICD-10-CM | POA: Diagnosis not present

## 2022-08-05 DIAGNOSIS — I10 Essential (primary) hypertension: Secondary | ICD-10-CM | POA: Diagnosis not present

## 2022-08-05 DIAGNOSIS — T8249XA Other complication of vascular dialysis catheter, initial encounter: Secondary | ICD-10-CM | POA: Diagnosis not present

## 2022-08-05 DIAGNOSIS — D689 Coagulation defect, unspecified: Secondary | ICD-10-CM | POA: Diagnosis not present

## 2022-08-06 DIAGNOSIS — I10 Essential (primary) hypertension: Secondary | ICD-10-CM | POA: Diagnosis not present

## 2022-08-07 DIAGNOSIS — T8249XA Other complication of vascular dialysis catheter, initial encounter: Secondary | ICD-10-CM | POA: Diagnosis not present

## 2022-08-07 DIAGNOSIS — Z992 Dependence on renal dialysis: Secondary | ICD-10-CM | POA: Diagnosis not present

## 2022-08-07 DIAGNOSIS — I10 Essential (primary) hypertension: Secondary | ICD-10-CM | POA: Diagnosis not present

## 2022-08-07 DIAGNOSIS — N186 End stage renal disease: Secondary | ICD-10-CM | POA: Diagnosis not present

## 2022-08-07 DIAGNOSIS — N2581 Secondary hyperparathyroidism of renal origin: Secondary | ICD-10-CM | POA: Diagnosis not present

## 2022-08-07 DIAGNOSIS — D689 Coagulation defect, unspecified: Secondary | ICD-10-CM | POA: Diagnosis not present

## 2022-08-08 DIAGNOSIS — I10 Essential (primary) hypertension: Secondary | ICD-10-CM | POA: Diagnosis not present

## 2022-08-08 DIAGNOSIS — D689 Coagulation defect, unspecified: Secondary | ICD-10-CM | POA: Diagnosis not present

## 2022-08-08 DIAGNOSIS — Z992 Dependence on renal dialysis: Secondary | ICD-10-CM | POA: Diagnosis not present

## 2022-08-08 DIAGNOSIS — N186 End stage renal disease: Secondary | ICD-10-CM | POA: Diagnosis not present

## 2022-08-08 DIAGNOSIS — T8249XA Other complication of vascular dialysis catheter, initial encounter: Secondary | ICD-10-CM | POA: Diagnosis not present

## 2022-08-08 DIAGNOSIS — N2581 Secondary hyperparathyroidism of renal origin: Secondary | ICD-10-CM | POA: Diagnosis not present

## 2022-08-09 DIAGNOSIS — I10 Essential (primary) hypertension: Secondary | ICD-10-CM | POA: Diagnosis not present

## 2022-08-10 DIAGNOSIS — T8249XA Other complication of vascular dialysis catheter, initial encounter: Secondary | ICD-10-CM | POA: Diagnosis not present

## 2022-08-10 DIAGNOSIS — I10 Essential (primary) hypertension: Secondary | ICD-10-CM | POA: Diagnosis not present

## 2022-08-10 DIAGNOSIS — Z992 Dependence on renal dialysis: Secondary | ICD-10-CM | POA: Diagnosis not present

## 2022-08-10 DIAGNOSIS — N186 End stage renal disease: Secondary | ICD-10-CM | POA: Diagnosis not present

## 2022-08-10 DIAGNOSIS — D689 Coagulation defect, unspecified: Secondary | ICD-10-CM | POA: Diagnosis not present

## 2022-08-10 DIAGNOSIS — N2581 Secondary hyperparathyroidism of renal origin: Secondary | ICD-10-CM | POA: Diagnosis not present

## 2022-08-11 DIAGNOSIS — I10 Essential (primary) hypertension: Secondary | ICD-10-CM | POA: Diagnosis not present

## 2022-08-12 DIAGNOSIS — Z992 Dependence on renal dialysis: Secondary | ICD-10-CM | POA: Diagnosis not present

## 2022-08-12 DIAGNOSIS — N186 End stage renal disease: Secondary | ICD-10-CM | POA: Diagnosis not present

## 2022-08-12 DIAGNOSIS — N2581 Secondary hyperparathyroidism of renal origin: Secondary | ICD-10-CM | POA: Diagnosis not present

## 2022-08-12 DIAGNOSIS — I10 Essential (primary) hypertension: Secondary | ICD-10-CM | POA: Diagnosis not present

## 2022-08-12 DIAGNOSIS — T8249XA Other complication of vascular dialysis catheter, initial encounter: Secondary | ICD-10-CM | POA: Diagnosis not present

## 2022-08-12 DIAGNOSIS — D689 Coagulation defect, unspecified: Secondary | ICD-10-CM | POA: Diagnosis not present

## 2022-08-13 DIAGNOSIS — I10 Essential (primary) hypertension: Secondary | ICD-10-CM | POA: Diagnosis not present

## 2022-08-14 DIAGNOSIS — D689 Coagulation defect, unspecified: Secondary | ICD-10-CM | POA: Diagnosis not present

## 2022-08-14 DIAGNOSIS — Z992 Dependence on renal dialysis: Secondary | ICD-10-CM | POA: Diagnosis not present

## 2022-08-14 DIAGNOSIS — N2581 Secondary hyperparathyroidism of renal origin: Secondary | ICD-10-CM | POA: Diagnosis not present

## 2022-08-14 DIAGNOSIS — I10 Essential (primary) hypertension: Secondary | ICD-10-CM | POA: Diagnosis not present

## 2022-08-14 DIAGNOSIS — T8249XA Other complication of vascular dialysis catheter, initial encounter: Secondary | ICD-10-CM | POA: Diagnosis not present

## 2022-08-14 DIAGNOSIS — N186 End stage renal disease: Secondary | ICD-10-CM | POA: Diagnosis not present

## 2022-08-15 DIAGNOSIS — I10 Essential (primary) hypertension: Secondary | ICD-10-CM | POA: Diagnosis not present

## 2022-08-16 DIAGNOSIS — T8249XA Other complication of vascular dialysis catheter, initial encounter: Secondary | ICD-10-CM | POA: Diagnosis not present

## 2022-08-16 DIAGNOSIS — N186 End stage renal disease: Secondary | ICD-10-CM | POA: Diagnosis not present

## 2022-08-16 DIAGNOSIS — D689 Coagulation defect, unspecified: Secondary | ICD-10-CM | POA: Diagnosis not present

## 2022-08-16 DIAGNOSIS — Z992 Dependence on renal dialysis: Secondary | ICD-10-CM | POA: Diagnosis not present

## 2022-08-16 DIAGNOSIS — I10 Essential (primary) hypertension: Secondary | ICD-10-CM | POA: Diagnosis not present

## 2022-08-16 DIAGNOSIS — N2581 Secondary hyperparathyroidism of renal origin: Secondary | ICD-10-CM | POA: Diagnosis not present

## 2022-08-17 DIAGNOSIS — I10 Essential (primary) hypertension: Secondary | ICD-10-CM | POA: Diagnosis not present

## 2022-08-18 DIAGNOSIS — D689 Coagulation defect, unspecified: Secondary | ICD-10-CM | POA: Diagnosis not present

## 2022-08-18 DIAGNOSIS — Z992 Dependence on renal dialysis: Secondary | ICD-10-CM | POA: Diagnosis not present

## 2022-08-18 DIAGNOSIS — I10 Essential (primary) hypertension: Secondary | ICD-10-CM | POA: Diagnosis not present

## 2022-08-18 DIAGNOSIS — N2581 Secondary hyperparathyroidism of renal origin: Secondary | ICD-10-CM | POA: Diagnosis not present

## 2022-08-18 DIAGNOSIS — N186 End stage renal disease: Secondary | ICD-10-CM | POA: Diagnosis not present

## 2022-08-18 DIAGNOSIS — T8249XA Other complication of vascular dialysis catheter, initial encounter: Secondary | ICD-10-CM | POA: Diagnosis not present

## 2022-08-19 DIAGNOSIS — I10 Essential (primary) hypertension: Secondary | ICD-10-CM | POA: Diagnosis not present

## 2022-08-20 DIAGNOSIS — I10 Essential (primary) hypertension: Secondary | ICD-10-CM | POA: Diagnosis not present

## 2022-08-21 DIAGNOSIS — N186 End stage renal disease: Secondary | ICD-10-CM | POA: Diagnosis not present

## 2022-08-21 DIAGNOSIS — D689 Coagulation defect, unspecified: Secondary | ICD-10-CM | POA: Diagnosis not present

## 2022-08-21 DIAGNOSIS — T8249XA Other complication of vascular dialysis catheter, initial encounter: Secondary | ICD-10-CM | POA: Diagnosis not present

## 2022-08-21 DIAGNOSIS — I10 Essential (primary) hypertension: Secondary | ICD-10-CM | POA: Diagnosis not present

## 2022-08-21 DIAGNOSIS — N2581 Secondary hyperparathyroidism of renal origin: Secondary | ICD-10-CM | POA: Diagnosis not present

## 2022-08-21 DIAGNOSIS — Z992 Dependence on renal dialysis: Secondary | ICD-10-CM | POA: Diagnosis not present

## 2022-08-22 DIAGNOSIS — I10 Essential (primary) hypertension: Secondary | ICD-10-CM | POA: Diagnosis not present

## 2022-08-23 DIAGNOSIS — I10 Essential (primary) hypertension: Secondary | ICD-10-CM | POA: Diagnosis not present

## 2022-08-24 DIAGNOSIS — N186 End stage renal disease: Secondary | ICD-10-CM | POA: Diagnosis not present

## 2022-08-24 DIAGNOSIS — N2581 Secondary hyperparathyroidism of renal origin: Secondary | ICD-10-CM | POA: Diagnosis not present

## 2022-08-24 DIAGNOSIS — Z992 Dependence on renal dialysis: Secondary | ICD-10-CM | POA: Diagnosis not present

## 2022-08-24 DIAGNOSIS — D689 Coagulation defect, unspecified: Secondary | ICD-10-CM | POA: Diagnosis not present

## 2022-08-24 DIAGNOSIS — T8249XA Other complication of vascular dialysis catheter, initial encounter: Secondary | ICD-10-CM | POA: Diagnosis not present

## 2022-08-24 DIAGNOSIS — I10 Essential (primary) hypertension: Secondary | ICD-10-CM | POA: Diagnosis not present

## 2022-08-25 DIAGNOSIS — I10 Essential (primary) hypertension: Secondary | ICD-10-CM | POA: Diagnosis not present

## 2022-08-26 DIAGNOSIS — N186 End stage renal disease: Secondary | ICD-10-CM | POA: Diagnosis not present

## 2022-08-26 DIAGNOSIS — I10 Essential (primary) hypertension: Secondary | ICD-10-CM | POA: Diagnosis not present

## 2022-08-26 DIAGNOSIS — Z992 Dependence on renal dialysis: Secondary | ICD-10-CM | POA: Diagnosis not present

## 2022-08-26 DIAGNOSIS — D689 Coagulation defect, unspecified: Secondary | ICD-10-CM | POA: Diagnosis not present

## 2022-08-26 DIAGNOSIS — T8249XA Other complication of vascular dialysis catheter, initial encounter: Secondary | ICD-10-CM | POA: Diagnosis not present

## 2022-08-26 DIAGNOSIS — N2581 Secondary hyperparathyroidism of renal origin: Secondary | ICD-10-CM | POA: Diagnosis not present

## 2022-08-27 DIAGNOSIS — N186 End stage renal disease: Secondary | ICD-10-CM | POA: Diagnosis not present

## 2022-08-27 DIAGNOSIS — I129 Hypertensive chronic kidney disease with stage 1 through stage 4 chronic kidney disease, or unspecified chronic kidney disease: Secondary | ICD-10-CM | POA: Diagnosis not present

## 2022-08-27 DIAGNOSIS — Z992 Dependence on renal dialysis: Secondary | ICD-10-CM | POA: Diagnosis not present

## 2022-08-27 DIAGNOSIS — I10 Essential (primary) hypertension: Secondary | ICD-10-CM | POA: Diagnosis not present

## 2022-08-28 DIAGNOSIS — T8249XA Other complication of vascular dialysis catheter, initial encounter: Secondary | ICD-10-CM | POA: Diagnosis not present

## 2022-08-28 DIAGNOSIS — N2581 Secondary hyperparathyroidism of renal origin: Secondary | ICD-10-CM | POA: Diagnosis not present

## 2022-08-28 DIAGNOSIS — Z992 Dependence on renal dialysis: Secondary | ICD-10-CM | POA: Diagnosis not present

## 2022-08-28 DIAGNOSIS — N186 End stage renal disease: Secondary | ICD-10-CM | POA: Diagnosis not present

## 2022-08-28 DIAGNOSIS — D689 Coagulation defect, unspecified: Secondary | ICD-10-CM | POA: Diagnosis not present

## 2022-08-28 DIAGNOSIS — I10 Essential (primary) hypertension: Secondary | ICD-10-CM | POA: Diagnosis not present

## 2022-08-29 DIAGNOSIS — I10 Essential (primary) hypertension: Secondary | ICD-10-CM | POA: Diagnosis not present

## 2022-08-30 DIAGNOSIS — I10 Essential (primary) hypertension: Secondary | ICD-10-CM | POA: Diagnosis not present

## 2022-08-31 DIAGNOSIS — N186 End stage renal disease: Secondary | ICD-10-CM | POA: Diagnosis not present

## 2022-08-31 DIAGNOSIS — I10 Essential (primary) hypertension: Secondary | ICD-10-CM | POA: Diagnosis not present

## 2022-08-31 DIAGNOSIS — Z992 Dependence on renal dialysis: Secondary | ICD-10-CM | POA: Diagnosis not present

## 2022-08-31 DIAGNOSIS — D689 Coagulation defect, unspecified: Secondary | ICD-10-CM | POA: Diagnosis not present

## 2022-08-31 DIAGNOSIS — T8249XA Other complication of vascular dialysis catheter, initial encounter: Secondary | ICD-10-CM | POA: Diagnosis not present

## 2022-08-31 DIAGNOSIS — N2581 Secondary hyperparathyroidism of renal origin: Secondary | ICD-10-CM | POA: Diagnosis not present

## 2022-09-01 DIAGNOSIS — I10 Essential (primary) hypertension: Secondary | ICD-10-CM | POA: Diagnosis not present

## 2022-09-02 DIAGNOSIS — Z992 Dependence on renal dialysis: Secondary | ICD-10-CM | POA: Diagnosis not present

## 2022-09-02 DIAGNOSIS — T8249XA Other complication of vascular dialysis catheter, initial encounter: Secondary | ICD-10-CM | POA: Diagnosis not present

## 2022-09-02 DIAGNOSIS — N2581 Secondary hyperparathyroidism of renal origin: Secondary | ICD-10-CM | POA: Diagnosis not present

## 2022-09-02 DIAGNOSIS — I10 Essential (primary) hypertension: Secondary | ICD-10-CM | POA: Diagnosis not present

## 2022-09-02 DIAGNOSIS — N186 End stage renal disease: Secondary | ICD-10-CM | POA: Diagnosis not present

## 2022-09-02 DIAGNOSIS — D689 Coagulation defect, unspecified: Secondary | ICD-10-CM | POA: Diagnosis not present

## 2022-09-03 DIAGNOSIS — I10 Essential (primary) hypertension: Secondary | ICD-10-CM | POA: Diagnosis not present

## 2022-09-04 DIAGNOSIS — T8249XA Other complication of vascular dialysis catheter, initial encounter: Secondary | ICD-10-CM | POA: Diagnosis not present

## 2022-09-04 DIAGNOSIS — D689 Coagulation defect, unspecified: Secondary | ICD-10-CM | POA: Diagnosis not present

## 2022-09-04 DIAGNOSIS — Z992 Dependence on renal dialysis: Secondary | ICD-10-CM | POA: Diagnosis not present

## 2022-09-04 DIAGNOSIS — N2581 Secondary hyperparathyroidism of renal origin: Secondary | ICD-10-CM | POA: Diagnosis not present

## 2022-09-04 DIAGNOSIS — I10 Essential (primary) hypertension: Secondary | ICD-10-CM | POA: Diagnosis not present

## 2022-09-04 DIAGNOSIS — N186 End stage renal disease: Secondary | ICD-10-CM | POA: Diagnosis not present

## 2022-09-05 DIAGNOSIS — I10 Essential (primary) hypertension: Secondary | ICD-10-CM | POA: Diagnosis not present

## 2022-09-06 DIAGNOSIS — I10 Essential (primary) hypertension: Secondary | ICD-10-CM | POA: Diagnosis not present

## 2022-09-07 DIAGNOSIS — I10 Essential (primary) hypertension: Secondary | ICD-10-CM | POA: Diagnosis not present

## 2022-09-07 DIAGNOSIS — D689 Coagulation defect, unspecified: Secondary | ICD-10-CM | POA: Diagnosis not present

## 2022-09-07 DIAGNOSIS — N2581 Secondary hyperparathyroidism of renal origin: Secondary | ICD-10-CM | POA: Diagnosis not present

## 2022-09-07 DIAGNOSIS — N186 End stage renal disease: Secondary | ICD-10-CM | POA: Diagnosis not present

## 2022-09-07 DIAGNOSIS — Z992 Dependence on renal dialysis: Secondary | ICD-10-CM | POA: Diagnosis not present

## 2022-09-07 DIAGNOSIS — T8249XA Other complication of vascular dialysis catheter, initial encounter: Secondary | ICD-10-CM | POA: Diagnosis not present

## 2022-09-08 DIAGNOSIS — I10 Essential (primary) hypertension: Secondary | ICD-10-CM | POA: Diagnosis not present

## 2022-09-09 DIAGNOSIS — T8249XA Other complication of vascular dialysis catheter, initial encounter: Secondary | ICD-10-CM | POA: Diagnosis not present

## 2022-09-09 DIAGNOSIS — N2581 Secondary hyperparathyroidism of renal origin: Secondary | ICD-10-CM | POA: Diagnosis not present

## 2022-09-09 DIAGNOSIS — I10 Essential (primary) hypertension: Secondary | ICD-10-CM | POA: Diagnosis not present

## 2022-09-09 DIAGNOSIS — N186 End stage renal disease: Secondary | ICD-10-CM | POA: Diagnosis not present

## 2022-09-09 DIAGNOSIS — D689 Coagulation defect, unspecified: Secondary | ICD-10-CM | POA: Diagnosis not present

## 2022-09-09 DIAGNOSIS — Z992 Dependence on renal dialysis: Secondary | ICD-10-CM | POA: Diagnosis not present

## 2022-09-10 DIAGNOSIS — I10 Essential (primary) hypertension: Secondary | ICD-10-CM | POA: Diagnosis not present

## 2022-09-11 DIAGNOSIS — N186 End stage renal disease: Secondary | ICD-10-CM | POA: Diagnosis not present

## 2022-09-11 DIAGNOSIS — T8249XA Other complication of vascular dialysis catheter, initial encounter: Secondary | ICD-10-CM | POA: Diagnosis not present

## 2022-09-11 DIAGNOSIS — I10 Essential (primary) hypertension: Secondary | ICD-10-CM | POA: Diagnosis not present

## 2022-09-11 DIAGNOSIS — Z992 Dependence on renal dialysis: Secondary | ICD-10-CM | POA: Diagnosis not present

## 2022-09-11 DIAGNOSIS — N2581 Secondary hyperparathyroidism of renal origin: Secondary | ICD-10-CM | POA: Diagnosis not present

## 2022-09-11 DIAGNOSIS — D689 Coagulation defect, unspecified: Secondary | ICD-10-CM | POA: Diagnosis not present

## 2022-09-12 DIAGNOSIS — I10 Essential (primary) hypertension: Secondary | ICD-10-CM | POA: Diagnosis not present

## 2022-09-13 DIAGNOSIS — I10 Essential (primary) hypertension: Secondary | ICD-10-CM | POA: Diagnosis not present

## 2022-09-14 DIAGNOSIS — I10 Essential (primary) hypertension: Secondary | ICD-10-CM | POA: Diagnosis not present

## 2022-09-15 DIAGNOSIS — N186 End stage renal disease: Secondary | ICD-10-CM | POA: Diagnosis not present

## 2022-09-15 DIAGNOSIS — I10 Essential (primary) hypertension: Secondary | ICD-10-CM | POA: Diagnosis not present

## 2022-09-15 DIAGNOSIS — T8249XA Other complication of vascular dialysis catheter, initial encounter: Secondary | ICD-10-CM | POA: Diagnosis not present

## 2022-09-15 DIAGNOSIS — Z992 Dependence on renal dialysis: Secondary | ICD-10-CM | POA: Diagnosis not present

## 2022-09-15 DIAGNOSIS — D689 Coagulation defect, unspecified: Secondary | ICD-10-CM | POA: Diagnosis not present

## 2022-09-15 DIAGNOSIS — N2581 Secondary hyperparathyroidism of renal origin: Secondary | ICD-10-CM | POA: Diagnosis not present

## 2022-09-16 DIAGNOSIS — Z992 Dependence on renal dialysis: Secondary | ICD-10-CM | POA: Diagnosis not present

## 2022-09-16 DIAGNOSIS — I10 Essential (primary) hypertension: Secondary | ICD-10-CM | POA: Diagnosis not present

## 2022-09-16 DIAGNOSIS — N2581 Secondary hyperparathyroidism of renal origin: Secondary | ICD-10-CM | POA: Diagnosis not present

## 2022-09-16 DIAGNOSIS — D689 Coagulation defect, unspecified: Secondary | ICD-10-CM | POA: Diagnosis not present

## 2022-09-16 DIAGNOSIS — T8249XA Other complication of vascular dialysis catheter, initial encounter: Secondary | ICD-10-CM | POA: Diagnosis not present

## 2022-09-16 DIAGNOSIS — N186 End stage renal disease: Secondary | ICD-10-CM | POA: Diagnosis not present

## 2022-09-17 DIAGNOSIS — I10 Essential (primary) hypertension: Secondary | ICD-10-CM | POA: Diagnosis not present

## 2022-09-18 DIAGNOSIS — Z992 Dependence on renal dialysis: Secondary | ICD-10-CM | POA: Diagnosis not present

## 2022-09-18 DIAGNOSIS — N186 End stage renal disease: Secondary | ICD-10-CM | POA: Diagnosis not present

## 2022-09-18 DIAGNOSIS — T8249XA Other complication of vascular dialysis catheter, initial encounter: Secondary | ICD-10-CM | POA: Diagnosis not present

## 2022-09-18 DIAGNOSIS — D689 Coagulation defect, unspecified: Secondary | ICD-10-CM | POA: Diagnosis not present

## 2022-09-18 DIAGNOSIS — I10 Essential (primary) hypertension: Secondary | ICD-10-CM | POA: Diagnosis not present

## 2022-09-18 DIAGNOSIS — N2581 Secondary hyperparathyroidism of renal origin: Secondary | ICD-10-CM | POA: Diagnosis not present

## 2022-09-19 DIAGNOSIS — I10 Essential (primary) hypertension: Secondary | ICD-10-CM | POA: Diagnosis not present

## 2022-09-20 DIAGNOSIS — I10 Essential (primary) hypertension: Secondary | ICD-10-CM | POA: Diagnosis not present

## 2022-09-20 DIAGNOSIS — T8249XA Other complication of vascular dialysis catheter, initial encounter: Secondary | ICD-10-CM | POA: Diagnosis not present

## 2022-09-20 DIAGNOSIS — Z992 Dependence on renal dialysis: Secondary | ICD-10-CM | POA: Diagnosis not present

## 2022-09-20 DIAGNOSIS — D689 Coagulation defect, unspecified: Secondary | ICD-10-CM | POA: Diagnosis not present

## 2022-09-20 DIAGNOSIS — N186 End stage renal disease: Secondary | ICD-10-CM | POA: Diagnosis not present

## 2022-09-20 DIAGNOSIS — N2581 Secondary hyperparathyroidism of renal origin: Secondary | ICD-10-CM | POA: Diagnosis not present

## 2022-09-21 DIAGNOSIS — I10 Essential (primary) hypertension: Secondary | ICD-10-CM | POA: Diagnosis not present

## 2022-09-22 ENCOUNTER — Other Ambulatory Visit (INDEPENDENT_AMBULATORY_CARE_PROVIDER_SITE_OTHER): Payer: Self-pay | Admitting: Primary Care

## 2022-09-22 DIAGNOSIS — I10 Essential (primary) hypertension: Secondary | ICD-10-CM | POA: Diagnosis not present

## 2022-09-23 DIAGNOSIS — D689 Coagulation defect, unspecified: Secondary | ICD-10-CM | POA: Diagnosis not present

## 2022-09-23 DIAGNOSIS — N186 End stage renal disease: Secondary | ICD-10-CM | POA: Diagnosis not present

## 2022-09-23 DIAGNOSIS — I10 Essential (primary) hypertension: Secondary | ICD-10-CM | POA: Diagnosis not present

## 2022-09-23 DIAGNOSIS — N2581 Secondary hyperparathyroidism of renal origin: Secondary | ICD-10-CM | POA: Diagnosis not present

## 2022-09-23 DIAGNOSIS — T8249XA Other complication of vascular dialysis catheter, initial encounter: Secondary | ICD-10-CM | POA: Diagnosis not present

## 2022-09-23 DIAGNOSIS — Z992 Dependence on renal dialysis: Secondary | ICD-10-CM | POA: Diagnosis not present

## 2022-09-24 ENCOUNTER — Ambulatory Visit (INDEPENDENT_AMBULATORY_CARE_PROVIDER_SITE_OTHER): Payer: Medicare HMO | Admitting: Primary Care

## 2022-09-24 DIAGNOSIS — I10 Essential (primary) hypertension: Secondary | ICD-10-CM | POA: Diagnosis not present

## 2022-09-25 DIAGNOSIS — I10 Essential (primary) hypertension: Secondary | ICD-10-CM | POA: Diagnosis not present

## 2022-09-25 DIAGNOSIS — Z992 Dependence on renal dialysis: Secondary | ICD-10-CM | POA: Diagnosis not present

## 2022-09-25 DIAGNOSIS — T8249XA Other complication of vascular dialysis catheter, initial encounter: Secondary | ICD-10-CM | POA: Diagnosis not present

## 2022-09-25 DIAGNOSIS — D689 Coagulation defect, unspecified: Secondary | ICD-10-CM | POA: Diagnosis not present

## 2022-09-25 DIAGNOSIS — N186 End stage renal disease: Secondary | ICD-10-CM | POA: Diagnosis not present

## 2022-09-25 DIAGNOSIS — N2581 Secondary hyperparathyroidism of renal origin: Secondary | ICD-10-CM | POA: Diagnosis not present

## 2022-09-26 DIAGNOSIS — I10 Essential (primary) hypertension: Secondary | ICD-10-CM | POA: Diagnosis not present

## 2022-09-27 DIAGNOSIS — D689 Coagulation defect, unspecified: Secondary | ICD-10-CM | POA: Diagnosis not present

## 2022-09-27 DIAGNOSIS — Z992 Dependence on renal dialysis: Secondary | ICD-10-CM | POA: Diagnosis not present

## 2022-09-27 DIAGNOSIS — I10 Essential (primary) hypertension: Secondary | ICD-10-CM | POA: Diagnosis not present

## 2022-09-27 DIAGNOSIS — T8249XA Other complication of vascular dialysis catheter, initial encounter: Secondary | ICD-10-CM | POA: Diagnosis not present

## 2022-09-27 DIAGNOSIS — I129 Hypertensive chronic kidney disease with stage 1 through stage 4 chronic kidney disease, or unspecified chronic kidney disease: Secondary | ICD-10-CM | POA: Diagnosis not present

## 2022-09-27 DIAGNOSIS — N186 End stage renal disease: Secondary | ICD-10-CM | POA: Diagnosis not present

## 2022-09-27 DIAGNOSIS — N2581 Secondary hyperparathyroidism of renal origin: Secondary | ICD-10-CM | POA: Diagnosis not present

## 2022-09-28 DIAGNOSIS — I10 Essential (primary) hypertension: Secondary | ICD-10-CM | POA: Diagnosis not present

## 2022-09-29 ENCOUNTER — Ambulatory Visit: Payer: MEDICARE | Admitting: Physical Therapy

## 2022-09-29 DIAGNOSIS — I10 Essential (primary) hypertension: Secondary | ICD-10-CM | POA: Diagnosis not present

## 2022-09-29 NOTE — Therapy (Deleted)
OUTPATIENT PHYSICAL THERAPY LOWER EXTREMITY EVALUATION  Patient Name: [name] MRN: [id number] DOB:17-Mar-1981, 42 y.o., male Today's Date: 09/29/2022    Past Medical History:  Diagnosis Date   Anemia    Cellulitis and abscess of toe of left foot    Diabetes mellitus without complication ([place])    Diabetic gastroparesis ([place])    Diabetic ulcer of left midfoot associated with diabetes mellitus due to underlying condition, with necrosis of bone [facility])    Dialysis patient [facility])    Hypertension    Renal disorder    Dialysis   Sepsis [facility])    Past Surgical History:  Procedure Laterality Date   AMPUTATION Right 02/02/2018   Procedure: RIGHT FIFTH TOE AND METATARSAL AMPUTATION. Filetted toe flap metatarsal resection. Debridement Plantar Foot wound;  Surgeon: Evelina Bucy, DPM;  Location: [place];  Service: Podiatry;  Laterality: Right;   AMPUTATION Left 08/20/2018   Procedure: FIFTH METATARSAL BONE BIOPSY;  Surgeon: Evelina Bucy, DPM;  Location: [place];  Service: Podiatry;  Laterality: Left;   AMPUTATION Left 10/28/2018   Procedure: LEFT GREAT TOE AMPUTATION;  Surgeon: Evelina Bucy, DPM;  Location: [place];  Service: Podiatry;  Laterality: Left;   AMPUTATION Left 09/16/2020   Procedure: AMPUTATION BELOW KNEE;  Surgeon: Rosetta Posner, MD;  Location: [place];  Service: Vascular;  Laterality: Left;   AMPUTATION Left 10/15/2020   Procedure: AMPUTATION ABOVE KNEE;  Surgeon: Elam Dutch, MD;  Location: [place];  Service: Vascular;  Laterality: Left;   APPLICATION OF WOUND VAC  02/02/2018   Procedure: APPLICATION OF WOUND VAC  Right Foot;  Surgeon: Evelina Bucy, DPM;  Location: [place];  Service: Podiatry;;   APPLICATION OF WOUND VAC Left 10/28/2018   Procedure: APPLICATION OF WOUND VAC LEFT TOE;  Surgeon: Evelina Bucy, DPM;  Location: [place];  Service: Podiatry;  Laterality: Left;   APPLICATION OF WOUND VAC Left 11/01/2018   Procedure: APPLICATION OF WOUND VAC;  Surgeon: Evelina Bucy, DPM;  Location: [place];  Service: Podiatry;  Laterality: Left;   AV FISTULA PLACEMENT     left arm.   AV FISTULA PLACEMENT Right 12/22/2016   Procedure: INSERTION OF ARTERIOVENOUS (AV) GORE-TEX GRAFT ARM;  Surgeon: Elam Dutch, MD;  Location: [place];  Service: Vascular;  Laterality: Right;   AV FISTULA PLACEMENT Left 05/26/2018   Procedure: INSERTION OF  ARTERIOVENOUS (AV) GORE-TEX GRAFT LEFT ARM;  Surgeon: Serafina Mitchell, MD;  Location: [place];  Service: Vascular;  Laterality: Left;   EYE SURGERY     I & D EXTREMITY Right 10/31/2017   Procedure: IRRIGATION AND DEBRIDEMENT RIGHT FOOT;  Surgeon: Evelina Bucy, DPM;  Location: [place];  Service: Podiatry;  Laterality: Right;   I & D EXTREMITY Left 08/20/2018   Procedure: IRRIGATION AND DEBRIDEMENT EXTREMITY WITH SECONDARY WOUND CLOSUREAND APPLICATION OF WOUND VAC LEFT FOOT;  Surgeon: Evelina Bucy, DPM;  Location: [place];  Service: Podiatry;  Laterality: Left;   I & D EXTREMITY Left 10/20/2018   Procedure: IRRIGATION AND DEBRIDEMENT LEFT FOOT  DEBRIDEMENT LATERAL FOOT WOUND;  Surgeon: Evelina Bucy, DPM;  Location: [place];  Service: Podiatry;  Laterality: Left;   I & D EXTREMITY Left 10/28/2018   Procedure: IRRIGATION AND DEBRIDEMENT LEFT TOE;  Surgeon: Evelina Bucy, DPM;  Location: [place];  Service: Podiatry;  Laterality: Left;   I & D EXTREMITY Left 09/14/2020   Procedure: IRRIGATION AND DEBRIDEMENT WRIST;  Surgeon: Leanora Cover, MD;  Location: [place];  Service: Orthopedics;  Laterality: Left;   INSERTION OF DIALYSIS CATHETER     Right subclavian   IR AV DIALY SHUNT INTRO NEEDLE/INTRACATH INITIAL W/PTA/IMG RIGHT Right 02/05/2018   IR FLUORO GUIDE CV LINE RIGHT  01/31/2020   IR FLUORO GUIDE CV LINE RIGHT  01/30/2022   IR PTA VENOUS EXCEPT DIALYSIS CIRCUIT  01/30/2022   IR THROMBECTOMY AV FISTULA W/THROMBOLYSIS/PTA INC/SHUNT/IMG LEFT Left 08/24/2018   IR THROMBECTOMY AV FISTULA W/THROMBOLYSIS/PTA INC/SHUNT/IMG LEFT Left 01/06/2019    IR US GUIDE VASC ACCESS LEFT  08/24/2018   IR US GUIDE VASC ACCESS RIGHT  02/05/2018   IR US GUIDE VASC ACCESS RIGHT  01/31/2020   IR VENOCAVAGRAM SVC  01/30/2022   IRRIGATION AND DEBRIDEMENT FOOT Right 10/23/2018   Procedure: Irrigation and Debridement to tendon, Left Foot;  Surgeon: Evelina Bucy, DPM;  Location: [place];  Service: Podiatry;  Laterality: Right;   IRRIGATION AND DEBRIDEMENT FOOT Left 11/01/2018   Procedure: IRRIGATION AND DEBRIDEMENT PARTIAL WOUND CLOSURE LOCAL TISSUE TRANSFER AND FLAP ROTATION, LEFT FOOT;  Surgeon: Evelina Bucy, DPM;  Location: [place];  Service: Podiatry;  Laterality: Left;   TRANSMETATARSAL AMPUTATION N/A 08/18/2018   Procedure: IRRIGATION AND DEBRIDEMENT OF LEFT 5TH TOE AND TRANSMETATARSAL, WITH PARTICAL LEFT 5TH TOE AND METATARSAL AMPUTATION, BONE BIOPSY, WOUND VAC APPLICATION.;  Surgeon: Evelina Bucy, DPM;  Location: [place];  Service: Podiatry;  Laterality: N/A;   UPPER EXTREMITY VENOGRAPHY N/A 11/16/2016   Procedure: Upper Extremity Venography - Right Central;  Surgeon: Elam Dutch, MD;  Location: [place] CV LAB;  Service: Cardiovascular;  Laterality: N/A;   UPPER EXTREMITY VENOGRAPHY N/A 05/25/2018   Procedure: UPPER EXTREMITY VENOGRAPHY - Bilateral;  Surgeon: Marty Heck, MD;  Location: [place] CV LAB;  Service: Cardiovascular;  Laterality: N/A;   Patient Active Problem List   Diagnosis Date Noted   DKA (diabetic ketoacidosis) ([place]) 08/09/2021   Cellulitis of right leg 05/19/2021   Pressure injury of skin 10/15/2020   Adjustment disorder, unspecified 09/21/2020   Malnutrition of moderate degree 09/13/2020   Charcot's joint of foot, left    Pneumonia due to COVID-19 virus 02/14/2020   Allergy, unspecified, initial encounter 07/17/2019   Diarrhea 04/24/2019   Encounter for orthopedic aftercare following surgical amputation    DM (diabetes mellitus), secondary, uncontrolled, with neurologic complications    Headache 09/23/2018    Anemia of chronic disease 08/18/2018   Unspecified protein-calorie malnutrition ([place]) 02/14/2018   Type 1 diabetes mellitus with complication, without long-term current use of insulin ([place]) 10/25/2017   Diabetic gastroparesis ([place]) 10/25/2017   Iron deficiency anemia, unspecified 10/28/2016   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  REFERRING DIAG: "Strength and Stability"  Rationale for Evaluation and Treatment:  Rehabilitation  SUBJECTIVE:  PERTINENT PAST HISTORY:  DM TI, charcot foot (L), dialysis, ESRD        PRECAUTIONS: Fall  WEIGHT BEARING RESTRICTIONS No  FALLS:  Has patient fallen in last 6 months? {yes/no:20286}, Number of falls: ***  MOI/History of condition:  Onset date: ***  SUBJECTIVE STATEMENT  [name] is a 42 y.o. male who presents to clinic with chief complaint of ***.  ***  From referring provider:   "***"   Red flags:  {has/denies:26543} {kerredflag:26542}  Pain:  Are you having pain? {yes/no:20286} Pain location: *** NPRS scale:  {NUMBERS; 0-10:5044}/10 to {NUMBERS; 0-10:5044}/10 Aggravating factors: *** Relieving factors: *** Pain description: {PAIN DESCRIPTION:21022940} Stage: {Desc; acute/subacute/chronic:13799} Stability: {kerbetterworse:26715} 24 hour pattern: ***   Occupation: ***  Assistive Device: ***  Hand Dominance: ***  Patient Goals/Specific Activities: ***   OBJECTIVE:   DIAGNOSTIC FINDINGS:  ***   GENERAL OBSERVATION/GAIT:   ***  SENSATION:  Light touch: {intact/deficits:24005}  PALPATION: ***  MUSCLE LENGTH: Hamstrings: Right {kerminsig:27227} restriction; Left {kerminsig:27227} restriction ASLR: Right  {keraslr:27228}; Left {keraslr:27228} Marcello Moores test: Right {kerminsig:27227} restriction; Left {kerminsig:27227} restriction Ely's test: Right {kerminsig:27227} restriction; Left {kerminsig:27227} restriction  LE MMT:  MMT Right 09/29/2022 Left 09/29/2022  Hip flexion (L2, L3) *** ***  Knee extension (L3) *** ***  Knee flexion *** ***  Hip abduction *** ***  Hip extension *** ***  Hip external rotation    Hip internal rotation    Hip adduction    Ankle dorsiflexion (L4)    Ankle plantarflexion (S1)    Ankle inversion    Ankle eversion    Great Toe ext (L5)    Grossly     (Blank rows = not tested, score listed is out of 5 possible points.  N = WNL, D = diminished, C = clear for gross weakness with myotome testing, * = concordant pain with testing)   Functional Tests  Eval (09/29/2022)    2 MWT: ***    10 m max gait speed: ***'', *** m/s, AD: ***    30'' STS: ***x  UE used? ***                                                  LOWER EXTREMITY SPECIAL TESTS:  Knee special tests: {KNEE SPECIAL JYNWG:95621}   PATIENT SURVEYS:  {rehab surveys:24030}   TODAY'S TREATMENT: ***   PATIENT EDUCATION:  POC, diagnosis, prognosis, HEP, and outcome measures.  Pt educated via explanation, demonstration, and handout (HEP).  Pt confirms understanding verbally.   HOME EXERCISE PROGRAM: ***  ASTERISK SIGNS   [place] Signs Eval (09/29/2022)                                                 ASSESSMENT:  CLINICAL IMPRESSION: [name] is a 42 y.o. male who presents to clinic with signs and sxs consistent with ***.    OBJECTIVE IMPAIRMENTS: Pain, ***  ACTIVITY LIMITATIONS: ***  PERSONAL FACTORS: See medical history and pertinent history   REHAB POTENTIAL: {rehabpotential:25112}  CLINICAL DECISION MAKING: {clinical decision making:25114}  EVALUATION COMPLEXITY: {Evaluation complexity:25115}   GOALS:   SHORT TERM GOALS: Target date: 10/27/2022  [place] will be  >75% HEP compliant to improve carryover between sessions and facilitate independent management of condition  Evaluation (09/29/2022): ongoing Goal status: INITIAL   LONG TERM GOALS: Target date: 11/24/2022  [name] will improve 30'' STS (MCID 2) to >/= ***x (w/ UE?: ***) to show improved LE strength and improved transfers   Evaluation/Baseline (09/29/2022): ***x  w/ UE? *** Goal status: INITIAL   2.  [name] will improve two minute walk test to *** feet (MCID 40 ft).  Evaluation/Baseline (09/29/2022): *** Goal status: INITIAL    3.  [name] will improve 10 meter max gait speed to *** m/s (.1 m/s MCID) to show functional improvement in ambulation   Evaluation/Baseline (09/29/2022): *** m/s Goal status: INITIAL   [place]:     4.  [name] will report confidence in self management of condition at time of discharge with advanced HEP  Evaluation/Baseline (09/29/2022): unable to self manage Goal status: INITIAL   5.  ***   6.  ***   PLAN: PT FREQUENCY: 1-2x/week  PT DURATION: 8 weeks (Ending 11/24/2022)  PLANNED INTERVENTIONS: Therapeutic exercises, Aquatic therapy, Therapeutic activity, Neuro Muscular re-education, Gait training, Patient/Family education, Joint mobilization, Dry Needling, Electrical stimulation, Spinal mobilization and/or manipulation, Moist heat, Taping, Vasopneumatic device, Ionotophoresis '4mg'$ /ml Dexamethasone, and Manual therapy  PLAN FOR NEXT SESSION: ***   Shearon Balo PT, DPT 09/29/2022, 10:47 AM

## 2022-09-30 DIAGNOSIS — I10 Essential (primary) hypertension: Secondary | ICD-10-CM | POA: Diagnosis not present

## 2022-09-30 DIAGNOSIS — N2581 Secondary hyperparathyroidism of renal origin: Secondary | ICD-10-CM | POA: Diagnosis not present

## 2022-09-30 DIAGNOSIS — N186 End stage renal disease: Secondary | ICD-10-CM | POA: Diagnosis not present

## 2022-09-30 DIAGNOSIS — T8249XA Other complication of vascular dialysis catheter, initial encounter: Secondary | ICD-10-CM | POA: Diagnosis not present

## 2022-09-30 DIAGNOSIS — Z992 Dependence on renal dialysis: Secondary | ICD-10-CM | POA: Diagnosis not present

## 2022-09-30 DIAGNOSIS — D689 Coagulation defect, unspecified: Secondary | ICD-10-CM | POA: Diagnosis not present

## 2022-10-01 DIAGNOSIS — I10 Essential (primary) hypertension: Secondary | ICD-10-CM | POA: Diagnosis not present

## 2022-10-02 DIAGNOSIS — D689 Coagulation defect, unspecified: Secondary | ICD-10-CM | POA: Diagnosis not present

## 2022-10-02 DIAGNOSIS — N2581 Secondary hyperparathyroidism of renal origin: Secondary | ICD-10-CM | POA: Diagnosis not present

## 2022-10-02 DIAGNOSIS — T8249XA Other complication of vascular dialysis catheter, initial encounter: Secondary | ICD-10-CM | POA: Diagnosis not present

## 2022-10-02 DIAGNOSIS — N186 End stage renal disease: Secondary | ICD-10-CM | POA: Diagnosis not present

## 2022-10-02 DIAGNOSIS — I10 Essential (primary) hypertension: Secondary | ICD-10-CM | POA: Diagnosis not present

## 2022-10-02 DIAGNOSIS — Z992 Dependence on renal dialysis: Secondary | ICD-10-CM | POA: Diagnosis not present

## 2022-10-03 DIAGNOSIS — I10 Essential (primary) hypertension: Secondary | ICD-10-CM | POA: Diagnosis not present

## 2022-10-04 DIAGNOSIS — I10 Essential (primary) hypertension: Secondary | ICD-10-CM | POA: Diagnosis not present

## 2022-10-05 DIAGNOSIS — T8249XA Other complication of vascular dialysis catheter, initial encounter: Secondary | ICD-10-CM | POA: Diagnosis not present

## 2022-10-05 DIAGNOSIS — I10 Essential (primary) hypertension: Secondary | ICD-10-CM | POA: Diagnosis not present

## 2022-10-05 DIAGNOSIS — Z992 Dependence on renal dialysis: Secondary | ICD-10-CM | POA: Diagnosis not present

## 2022-10-05 DIAGNOSIS — N186 End stage renal disease: Secondary | ICD-10-CM | POA: Diagnosis not present

## 2022-10-05 DIAGNOSIS — N2581 Secondary hyperparathyroidism of renal origin: Secondary | ICD-10-CM | POA: Diagnosis not present

## 2022-10-05 DIAGNOSIS — D689 Coagulation defect, unspecified: Secondary | ICD-10-CM | POA: Diagnosis not present

## 2022-10-06 ENCOUNTER — Ambulatory Visit: Payer: MEDICARE | Attending: Physical Therapy | Admitting: Physical Therapy

## 2022-10-06 DIAGNOSIS — I10 Essential (primary) hypertension: Secondary | ICD-10-CM | POA: Diagnosis not present

## 2022-10-07 ENCOUNTER — Ambulatory Visit (INDEPENDENT_AMBULATORY_CARE_PROVIDER_SITE_OTHER): Payer: Medicare HMO | Admitting: Primary Care

## 2022-10-07 DIAGNOSIS — Z992 Dependence on renal dialysis: Secondary | ICD-10-CM | POA: Diagnosis not present

## 2022-10-07 DIAGNOSIS — I10 Essential (primary) hypertension: Secondary | ICD-10-CM | POA: Diagnosis not present

## 2022-10-07 DIAGNOSIS — T8249XA Other complication of vascular dialysis catheter, initial encounter: Secondary | ICD-10-CM | POA: Diagnosis not present

## 2022-10-07 DIAGNOSIS — N186 End stage renal disease: Secondary | ICD-10-CM | POA: Diagnosis not present

## 2022-10-07 DIAGNOSIS — N2581 Secondary hyperparathyroidism of renal origin: Secondary | ICD-10-CM | POA: Diagnosis not present

## 2022-10-07 DIAGNOSIS — D689 Coagulation defect, unspecified: Secondary | ICD-10-CM | POA: Diagnosis not present

## 2022-10-08 DIAGNOSIS — I10 Essential (primary) hypertension: Secondary | ICD-10-CM | POA: Diagnosis not present

## 2022-10-09 DIAGNOSIS — N186 End stage renal disease: Secondary | ICD-10-CM | POA: Diagnosis not present

## 2022-10-09 DIAGNOSIS — N2581 Secondary hyperparathyroidism of renal origin: Secondary | ICD-10-CM | POA: Diagnosis not present

## 2022-10-09 DIAGNOSIS — D689 Coagulation defect, unspecified: Secondary | ICD-10-CM | POA: Diagnosis not present

## 2022-10-09 DIAGNOSIS — Z992 Dependence on renal dialysis: Secondary | ICD-10-CM | POA: Diagnosis not present

## 2022-10-09 DIAGNOSIS — T8249XA Other complication of vascular dialysis catheter, initial encounter: Secondary | ICD-10-CM | POA: Diagnosis not present

## 2022-10-09 DIAGNOSIS — I10 Essential (primary) hypertension: Secondary | ICD-10-CM | POA: Diagnosis not present

## 2022-10-10 DIAGNOSIS — I499 Cardiac arrhythmia, unspecified: Secondary | ICD-10-CM | POA: Diagnosis not present

## 2022-10-10 DIAGNOSIS — R030 Elevated blood-pressure reading, without diagnosis of hypertension: Secondary | ICD-10-CM | POA: Diagnosis not present

## 2022-10-10 DIAGNOSIS — G40909 Epilepsy, unspecified, not intractable, without status epilepticus: Secondary | ICD-10-CM | POA: Diagnosis not present

## 2022-10-10 DIAGNOSIS — N186 End stage renal disease: Secondary | ICD-10-CM | POA: Diagnosis not present

## 2022-10-10 DIAGNOSIS — I700 Atherosclerosis of aorta: Secondary | ICD-10-CM | POA: Diagnosis not present

## 2022-10-10 DIAGNOSIS — G4733 Obstructive sleep apnea (adult) (pediatric): Secondary | ICD-10-CM | POA: Diagnosis not present

## 2022-10-10 DIAGNOSIS — E669 Obesity, unspecified: Secondary | ICD-10-CM | POA: Diagnosis not present

## 2022-10-10 DIAGNOSIS — M8580 Other specified disorders of bone density and structure, unspecified site: Secondary | ICD-10-CM | POA: Diagnosis not present

## 2022-10-10 DIAGNOSIS — Z8249 Family history of ischemic heart disease and other diseases of the circulatory system: Secondary | ICD-10-CM | POA: Diagnosis not present

## 2022-10-10 DIAGNOSIS — I10 Essential (primary) hypertension: Secondary | ICD-10-CM | POA: Diagnosis not present

## 2022-10-10 DIAGNOSIS — H5462 Unqualified visual loss, left eye, normal vision right eye: Secondary | ICD-10-CM | POA: Diagnosis not present

## 2022-10-10 DIAGNOSIS — Z9181 History of falling: Secondary | ICD-10-CM | POA: Diagnosis not present

## 2022-10-10 DIAGNOSIS — I2510 Atherosclerotic heart disease of native coronary artery without angina pectoris: Secondary | ICD-10-CM | POA: Diagnosis not present

## 2022-10-10 DIAGNOSIS — Z008 Encounter for other general examination: Secondary | ICD-10-CM | POA: Diagnosis not present

## 2022-10-11 DIAGNOSIS — I10 Essential (primary) hypertension: Secondary | ICD-10-CM | POA: Diagnosis not present

## 2022-10-12 DIAGNOSIS — I10 Essential (primary) hypertension: Secondary | ICD-10-CM | POA: Diagnosis not present

## 2022-10-12 DIAGNOSIS — N186 End stage renal disease: Secondary | ICD-10-CM | POA: Diagnosis not present

## 2022-10-12 DIAGNOSIS — N2581 Secondary hyperparathyroidism of renal origin: Secondary | ICD-10-CM | POA: Diagnosis not present

## 2022-10-12 DIAGNOSIS — D689 Coagulation defect, unspecified: Secondary | ICD-10-CM | POA: Diagnosis not present

## 2022-10-12 DIAGNOSIS — T8249XA Other complication of vascular dialysis catheter, initial encounter: Secondary | ICD-10-CM | POA: Diagnosis not present

## 2022-10-12 DIAGNOSIS — Z992 Dependence on renal dialysis: Secondary | ICD-10-CM | POA: Diagnosis not present

## 2022-10-13 DIAGNOSIS — I10 Essential (primary) hypertension: Secondary | ICD-10-CM | POA: Diagnosis not present

## 2022-10-14 DIAGNOSIS — N2581 Secondary hyperparathyroidism of renal origin: Secondary | ICD-10-CM | POA: Diagnosis not present

## 2022-10-14 DIAGNOSIS — T8249XA Other complication of vascular dialysis catheter, initial encounter: Secondary | ICD-10-CM | POA: Diagnosis not present

## 2022-10-14 DIAGNOSIS — I10 Essential (primary) hypertension: Secondary | ICD-10-CM | POA: Diagnosis not present

## 2022-10-14 DIAGNOSIS — N186 End stage renal disease: Secondary | ICD-10-CM | POA: Diagnosis not present

## 2022-10-14 DIAGNOSIS — Z992 Dependence on renal dialysis: Secondary | ICD-10-CM | POA: Diagnosis not present

## 2022-10-14 DIAGNOSIS — D689 Coagulation defect, unspecified: Secondary | ICD-10-CM | POA: Diagnosis not present

## 2022-10-15 DIAGNOSIS — I10 Essential (primary) hypertension: Secondary | ICD-10-CM | POA: Diagnosis not present

## 2022-10-16 DIAGNOSIS — Z992 Dependence on renal dialysis: Secondary | ICD-10-CM | POA: Diagnosis not present

## 2022-10-16 DIAGNOSIS — N2581 Secondary hyperparathyroidism of renal origin: Secondary | ICD-10-CM | POA: Diagnosis not present

## 2022-10-16 DIAGNOSIS — T8249XA Other complication of vascular dialysis catheter, initial encounter: Secondary | ICD-10-CM | POA: Diagnosis not present

## 2022-10-16 DIAGNOSIS — I10 Essential (primary) hypertension: Secondary | ICD-10-CM | POA: Diagnosis not present

## 2022-10-16 DIAGNOSIS — N186 End stage renal disease: Secondary | ICD-10-CM | POA: Diagnosis not present

## 2022-10-16 DIAGNOSIS — D689 Coagulation defect, unspecified: Secondary | ICD-10-CM | POA: Diagnosis not present

## 2022-10-17 DIAGNOSIS — I10 Essential (primary) hypertension: Secondary | ICD-10-CM | POA: Diagnosis not present

## 2022-10-18 DIAGNOSIS — I10 Essential (primary) hypertension: Secondary | ICD-10-CM | POA: Diagnosis not present

## 2022-10-19 DIAGNOSIS — N186 End stage renal disease: Secondary | ICD-10-CM | POA: Diagnosis not present

## 2022-10-19 DIAGNOSIS — Z992 Dependence on renal dialysis: Secondary | ICD-10-CM | POA: Diagnosis not present

## 2022-10-19 DIAGNOSIS — I10 Essential (primary) hypertension: Secondary | ICD-10-CM | POA: Diagnosis not present

## 2022-10-19 DIAGNOSIS — N2581 Secondary hyperparathyroidism of renal origin: Secondary | ICD-10-CM | POA: Diagnosis not present

## 2022-10-19 DIAGNOSIS — D689 Coagulation defect, unspecified: Secondary | ICD-10-CM | POA: Diagnosis not present

## 2022-10-19 DIAGNOSIS — T8249XA Other complication of vascular dialysis catheter, initial encounter: Secondary | ICD-10-CM | POA: Diagnosis not present

## 2022-10-20 DIAGNOSIS — I10 Essential (primary) hypertension: Secondary | ICD-10-CM | POA: Diagnosis not present

## 2022-10-21 DIAGNOSIS — D689 Coagulation defect, unspecified: Secondary | ICD-10-CM | POA: Diagnosis not present

## 2022-10-21 DIAGNOSIS — Z992 Dependence on renal dialysis: Secondary | ICD-10-CM | POA: Diagnosis not present

## 2022-10-21 DIAGNOSIS — N2581 Secondary hyperparathyroidism of renal origin: Secondary | ICD-10-CM | POA: Diagnosis not present

## 2022-10-21 DIAGNOSIS — N186 End stage renal disease: Secondary | ICD-10-CM | POA: Diagnosis not present

## 2022-10-21 DIAGNOSIS — I10 Essential (primary) hypertension: Secondary | ICD-10-CM | POA: Diagnosis not present

## 2022-10-21 DIAGNOSIS — T8249XA Other complication of vascular dialysis catheter, initial encounter: Secondary | ICD-10-CM | POA: Diagnosis not present

## 2022-10-22 ENCOUNTER — Ambulatory Visit (INDEPENDENT_AMBULATORY_CARE_PROVIDER_SITE_OTHER): Payer: Medicare HMO | Admitting: Primary Care

## 2022-10-22 DIAGNOSIS — I10 Essential (primary) hypertension: Secondary | ICD-10-CM | POA: Diagnosis not present

## 2022-10-23 DIAGNOSIS — N2581 Secondary hyperparathyroidism of renal origin: Secondary | ICD-10-CM | POA: Diagnosis not present

## 2022-10-23 DIAGNOSIS — T8249XA Other complication of vascular dialysis catheter, initial encounter: Secondary | ICD-10-CM | POA: Diagnosis not present

## 2022-10-23 DIAGNOSIS — Z992 Dependence on renal dialysis: Secondary | ICD-10-CM | POA: Diagnosis not present

## 2022-10-23 DIAGNOSIS — D689 Coagulation defect, unspecified: Secondary | ICD-10-CM | POA: Diagnosis not present

## 2022-10-23 DIAGNOSIS — N186 End stage renal disease: Secondary | ICD-10-CM | POA: Diagnosis not present

## 2022-10-26 DIAGNOSIS — Z992 Dependence on renal dialysis: Secondary | ICD-10-CM | POA: Diagnosis not present

## 2022-10-26 DIAGNOSIS — N186 End stage renal disease: Secondary | ICD-10-CM | POA: Diagnosis not present

## 2022-10-26 DIAGNOSIS — T8249XA Other complication of vascular dialysis catheter, initial encounter: Secondary | ICD-10-CM | POA: Diagnosis not present

## 2022-10-26 DIAGNOSIS — D689 Coagulation defect, unspecified: Secondary | ICD-10-CM | POA: Diagnosis not present

## 2022-10-26 DIAGNOSIS — N2581 Secondary hyperparathyroidism of renal origin: Secondary | ICD-10-CM | POA: Diagnosis not present

## 2022-10-27 ENCOUNTER — Other Ambulatory Visit (INDEPENDENT_AMBULATORY_CARE_PROVIDER_SITE_OTHER): Payer: Self-pay | Admitting: Primary Care

## 2022-10-28 DIAGNOSIS — I129 Hypertensive chronic kidney disease with stage 1 through stage 4 chronic kidney disease, or unspecified chronic kidney disease: Secondary | ICD-10-CM | POA: Diagnosis not present

## 2022-10-28 DIAGNOSIS — N186 End stage renal disease: Secondary | ICD-10-CM | POA: Diagnosis not present

## 2022-10-28 DIAGNOSIS — Z992 Dependence on renal dialysis: Secondary | ICD-10-CM | POA: Diagnosis not present

## 2022-10-28 NOTE — Telephone Encounter (Signed)
Requested Prescriptions  Pending Prescriptions Disp Refills   gabapentin (NEURONTIN) 100 MG capsule [Pharmacy Med Name: Gabapentin 100 MG Oral Capsule] 90 capsule 0    Sig: TAKE 1 CAPSULE BY MOUTH THREE TIMES DAILY     Neurology: Anticonvulsants - gabapentin Failed - 10/27/2022  3:04 PM      Failed - Cr in normal range and within 360 days    Creatinine  Date Value Ref Range Status  08/22/2014 5.00 (H) 0.60 - 1.30 mg/dL Final   Creatinine, Ser  Date Value Ref Range Status  04/17/2022 9.06 (H) 0.61 - 1.24 mg/dL Final         Passed - Completed PHQ-2 or PHQ-9 in the last 360 days      Passed - Valid encounter within last 12 months    Recent Outpatient Visits           4 months ago Type 1 diabetes mellitus with complication, on long-term current use of insulin ([place])   [place] [facility] Kerin Perna, NP   1 year ago Hx of above knee amputation, left ([place])   [place] Kerin Perna, NP   1 year ago Type 1 diabetes mellitus with other circulatory complication ([place])   [place], [name] P, NP   2 years ago Type 1 diabetes mellitus with other circulatory complication [facility])   [place] Kerin Perna, NP   3 years ago Need for Tdap vaccination   [place] Kerin Perna, NP               NOVOLIN R RELION 100 UNIT/ML injection [Pharmacy Med Name: NovoLIN R ReliOn 100 UNIT/ML Injection Solution] 10 mL 0    Sig: INJECT 12 UNITS SUBCUTANEOUSLY THREE TIMES DAILY BEFORE MEAL(S)     Endocrinology:  Diabetes - Insulins Failed - 10/27/2022  3:04 PM      Failed - HBA1C is between 0 and 7.9 and within 180 days    Hemoglobin A1C  Date Value Ref Range Status  08/22/2014 5.8 4.2 - 6.3 % Final    Comment:    The American Diabetes Association recommends that a primary goal of therapy should be <7% and that physicians  should reevaluate the treatment regimen in patients with HbA1c values consistently >8%.    HbA1c, POC (controlled diabetic range)  Date Value Ref Range Status  06/25/2022 10.6 (A) 0.0 - 7.0 % Final         Passed - Valid encounter within last 6 months    Recent Outpatient Visits           4 months ago Type 1 diabetes mellitus with complication, on long-term current use of insulin ([place])   [place] Kerin Perna, NP   1 year ago Hx of above knee amputation, left [facility])   [place] [facility] Kerin Perna, NP   1 year ago Type 1 diabetes mellitus with other circulatory complication [facility])   [facility] Kerin Perna, NP   2 years ago Type 1 diabetes mellitus with other circulatory complication [facility])   [place] [facility] Kerin Perna, NP   3 years ago Need for Tdap vaccination   [place] [facility] Kerin Perna, NP

## 2022-10-29 ENCOUNTER — Other Ambulatory Visit (INDEPENDENT_AMBULATORY_CARE_PROVIDER_SITE_OTHER): Payer: Self-pay | Admitting: Primary Care

## 2022-10-29 NOTE — Telephone Encounter (Signed)
Will forward to provider  

## 2022-10-30 DIAGNOSIS — Z992 Dependence on renal dialysis: Secondary | ICD-10-CM | POA: Diagnosis not present

## 2022-10-30 DIAGNOSIS — N186 End stage renal disease: Secondary | ICD-10-CM | POA: Diagnosis not present

## 2022-10-30 DIAGNOSIS — E1122 Type 2 diabetes mellitus with diabetic chronic kidney disease: Secondary | ICD-10-CM | POA: Diagnosis not present

## 2022-10-30 DIAGNOSIS — D689 Coagulation defect, unspecified: Secondary | ICD-10-CM | POA: Diagnosis not present

## 2022-10-30 DIAGNOSIS — N2581 Secondary hyperparathyroidism of renal origin: Secondary | ICD-10-CM | POA: Diagnosis not present

## 2022-10-30 DIAGNOSIS — T8249XA Other complication of vascular dialysis catheter, initial encounter: Secondary | ICD-10-CM | POA: Diagnosis not present

## 2022-11-02 DIAGNOSIS — N186 End stage renal disease: Secondary | ICD-10-CM | POA: Diagnosis not present

## 2022-11-02 DIAGNOSIS — E1122 Type 2 diabetes mellitus with diabetic chronic kidney disease: Secondary | ICD-10-CM | POA: Diagnosis not present

## 2022-11-02 DIAGNOSIS — Z992 Dependence on renal dialysis: Secondary | ICD-10-CM | POA: Diagnosis not present

## 2022-11-02 DIAGNOSIS — N2581 Secondary hyperparathyroidism of renal origin: Secondary | ICD-10-CM | POA: Diagnosis not present

## 2022-11-02 DIAGNOSIS — D689 Coagulation defect, unspecified: Secondary | ICD-10-CM | POA: Diagnosis not present

## 2022-11-02 DIAGNOSIS — T8249XA Other complication of vascular dialysis catheter, initial encounter: Secondary | ICD-10-CM | POA: Diagnosis not present

## 2022-11-04 DIAGNOSIS — D689 Coagulation defect, unspecified: Secondary | ICD-10-CM | POA: Diagnosis not present

## 2022-11-04 DIAGNOSIS — N186 End stage renal disease: Secondary | ICD-10-CM | POA: Diagnosis not present

## 2022-11-04 DIAGNOSIS — T8249XA Other complication of vascular dialysis catheter, initial encounter: Secondary | ICD-10-CM | POA: Diagnosis not present

## 2022-11-04 DIAGNOSIS — E1122 Type 2 diabetes mellitus with diabetic chronic kidney disease: Secondary | ICD-10-CM | POA: Diagnosis not present

## 2022-11-04 DIAGNOSIS — N2581 Secondary hyperparathyroidism of renal origin: Secondary | ICD-10-CM | POA: Diagnosis not present

## 2022-11-04 DIAGNOSIS — Z992 Dependence on renal dialysis: Secondary | ICD-10-CM | POA: Diagnosis not present

## 2022-11-06 DIAGNOSIS — N186 End stage renal disease: Secondary | ICD-10-CM | POA: Diagnosis not present

## 2022-11-06 DIAGNOSIS — D689 Coagulation defect, unspecified: Secondary | ICD-10-CM | POA: Diagnosis not present

## 2022-11-06 DIAGNOSIS — N2581 Secondary hyperparathyroidism of renal origin: Secondary | ICD-10-CM | POA: Diagnosis not present

## 2022-11-06 DIAGNOSIS — Z992 Dependence on renal dialysis: Secondary | ICD-10-CM | POA: Diagnosis not present

## 2022-11-06 DIAGNOSIS — E1122 Type 2 diabetes mellitus with diabetic chronic kidney disease: Secondary | ICD-10-CM | POA: Diagnosis not present

## 2022-11-06 DIAGNOSIS — T8249XA Other complication of vascular dialysis catheter, initial encounter: Secondary | ICD-10-CM | POA: Diagnosis not present

## 2022-11-09 DIAGNOSIS — Z992 Dependence on renal dialysis: Secondary | ICD-10-CM | POA: Diagnosis not present

## 2022-11-09 DIAGNOSIS — D689 Coagulation defect, unspecified: Secondary | ICD-10-CM | POA: Diagnosis not present

## 2022-11-09 DIAGNOSIS — N2581 Secondary hyperparathyroidism of renal origin: Secondary | ICD-10-CM | POA: Diagnosis not present

## 2022-11-09 DIAGNOSIS — T8249XA Other complication of vascular dialysis catheter, initial encounter: Secondary | ICD-10-CM | POA: Diagnosis not present

## 2022-11-09 DIAGNOSIS — E1122 Type 2 diabetes mellitus with diabetic chronic kidney disease: Secondary | ICD-10-CM | POA: Diagnosis not present

## 2022-11-09 DIAGNOSIS — N186 End stage renal disease: Secondary | ICD-10-CM | POA: Diagnosis not present

## 2022-11-10 ENCOUNTER — Other Ambulatory Visit (INDEPENDENT_AMBULATORY_CARE_PROVIDER_SITE_OTHER): Payer: Self-pay | Admitting: Primary Care

## 2022-11-11 DIAGNOSIS — N2581 Secondary hyperparathyroidism of renal origin: Secondary | ICD-10-CM | POA: Diagnosis not present

## 2022-11-11 DIAGNOSIS — E1122 Type 2 diabetes mellitus with diabetic chronic kidney disease: Secondary | ICD-10-CM | POA: Diagnosis not present

## 2022-11-11 DIAGNOSIS — Z992 Dependence on renal dialysis: Secondary | ICD-10-CM | POA: Diagnosis not present

## 2022-11-11 DIAGNOSIS — D689 Coagulation defect, unspecified: Secondary | ICD-10-CM | POA: Diagnosis not present

## 2022-11-11 DIAGNOSIS — T8249XA Other complication of vascular dialysis catheter, initial encounter: Secondary | ICD-10-CM | POA: Diagnosis not present

## 2022-11-11 DIAGNOSIS — N186 End stage renal disease: Secondary | ICD-10-CM | POA: Diagnosis not present

## 2022-11-11 LAB — HEMOGLOBIN A1C: Hemoglobin A1C: 9.5

## 2022-11-13 DIAGNOSIS — N2581 Secondary hyperparathyroidism of renal origin: Secondary | ICD-10-CM | POA: Diagnosis not present

## 2022-11-13 DIAGNOSIS — T8249XA Other complication of vascular dialysis catheter, initial encounter: Secondary | ICD-10-CM | POA: Diagnosis not present

## 2022-11-13 DIAGNOSIS — E1122 Type 2 diabetes mellitus with diabetic chronic kidney disease: Secondary | ICD-10-CM | POA: Diagnosis not present

## 2022-11-13 DIAGNOSIS — Z992 Dependence on renal dialysis: Secondary | ICD-10-CM | POA: Diagnosis not present

## 2022-11-13 DIAGNOSIS — N186 End stage renal disease: Secondary | ICD-10-CM | POA: Diagnosis not present

## 2022-11-13 DIAGNOSIS — D689 Coagulation defect, unspecified: Secondary | ICD-10-CM | POA: Diagnosis not present

## 2022-11-16 DIAGNOSIS — N186 End stage renal disease: Secondary | ICD-10-CM | POA: Diagnosis not present

## 2022-11-16 DIAGNOSIS — T8249XA Other complication of vascular dialysis catheter, initial encounter: Secondary | ICD-10-CM | POA: Diagnosis not present

## 2022-11-16 DIAGNOSIS — D689 Coagulation defect, unspecified: Secondary | ICD-10-CM | POA: Diagnosis not present

## 2022-11-16 DIAGNOSIS — N2581 Secondary hyperparathyroidism of renal origin: Secondary | ICD-10-CM | POA: Diagnosis not present

## 2022-11-16 DIAGNOSIS — Z992 Dependence on renal dialysis: Secondary | ICD-10-CM | POA: Diagnosis not present

## 2022-11-16 DIAGNOSIS — E1122 Type 2 diabetes mellitus with diabetic chronic kidney disease: Secondary | ICD-10-CM | POA: Diagnosis not present

## 2022-11-18 DIAGNOSIS — N186 End stage renal disease: Secondary | ICD-10-CM | POA: Diagnosis not present

## 2022-11-18 DIAGNOSIS — Z992 Dependence on renal dialysis: Secondary | ICD-10-CM | POA: Diagnosis not present

## 2022-11-18 DIAGNOSIS — N2581 Secondary hyperparathyroidism of renal origin: Secondary | ICD-10-CM | POA: Diagnosis not present

## 2022-11-18 DIAGNOSIS — D689 Coagulation defect, unspecified: Secondary | ICD-10-CM | POA: Diagnosis not present

## 2022-11-18 DIAGNOSIS — T8249XA Other complication of vascular dialysis catheter, initial encounter: Secondary | ICD-10-CM | POA: Diagnosis not present

## 2022-11-18 DIAGNOSIS — E1122 Type 2 diabetes mellitus with diabetic chronic kidney disease: Secondary | ICD-10-CM | POA: Diagnosis not present

## 2022-11-19 ENCOUNTER — Ambulatory Visit: Payer: MEDICARE | Admitting: Physical Therapy

## 2022-11-20 DIAGNOSIS — Z992 Dependence on renal dialysis: Secondary | ICD-10-CM | POA: Diagnosis not present

## 2022-11-20 DIAGNOSIS — E1122 Type 2 diabetes mellitus with diabetic chronic kidney disease: Secondary | ICD-10-CM | POA: Diagnosis not present

## 2022-11-20 DIAGNOSIS — T8249XA Other complication of vascular dialysis catheter, initial encounter: Secondary | ICD-10-CM | POA: Diagnosis not present

## 2022-11-20 DIAGNOSIS — D689 Coagulation defect, unspecified: Secondary | ICD-10-CM | POA: Diagnosis not present

## 2022-11-20 DIAGNOSIS — N2581 Secondary hyperparathyroidism of renal origin: Secondary | ICD-10-CM | POA: Diagnosis not present

## 2022-11-20 DIAGNOSIS — N186 End stage renal disease: Secondary | ICD-10-CM | POA: Diagnosis not present

## 2022-11-23 DIAGNOSIS — E1122 Type 2 diabetes mellitus with diabetic chronic kidney disease: Secondary | ICD-10-CM | POA: Diagnosis not present

## 2022-11-23 DIAGNOSIS — N2581 Secondary hyperparathyroidism of renal origin: Secondary | ICD-10-CM | POA: Diagnosis not present

## 2022-11-23 DIAGNOSIS — N186 End stage renal disease: Secondary | ICD-10-CM | POA: Diagnosis not present

## 2022-11-23 DIAGNOSIS — Z992 Dependence on renal dialysis: Secondary | ICD-10-CM | POA: Diagnosis not present

## 2022-11-23 DIAGNOSIS — D689 Coagulation defect, unspecified: Secondary | ICD-10-CM | POA: Diagnosis not present

## 2022-11-23 DIAGNOSIS — T8249XA Other complication of vascular dialysis catheter, initial encounter: Secondary | ICD-10-CM | POA: Diagnosis not present

## 2022-11-24 ENCOUNTER — Ambulatory Visit: Payer: MEDICARE | Admitting: Physical Therapy

## 2022-11-25 DIAGNOSIS — D689 Coagulation defect, unspecified: Secondary | ICD-10-CM | POA: Diagnosis not present

## 2022-11-25 DIAGNOSIS — E1122 Type 2 diabetes mellitus with diabetic chronic kidney disease: Secondary | ICD-10-CM | POA: Diagnosis not present

## 2022-11-25 DIAGNOSIS — Z992 Dependence on renal dialysis: Secondary | ICD-10-CM | POA: Diagnosis not present

## 2022-11-25 DIAGNOSIS — T8249XA Other complication of vascular dialysis catheter, initial encounter: Secondary | ICD-10-CM | POA: Diagnosis not present

## 2022-11-25 DIAGNOSIS — N2581 Secondary hyperparathyroidism of renal origin: Secondary | ICD-10-CM | POA: Diagnosis not present

## 2022-11-25 DIAGNOSIS — N186 End stage renal disease: Secondary | ICD-10-CM | POA: Diagnosis not present

## 2022-11-26 DIAGNOSIS — N186 End stage renal disease: Secondary | ICD-10-CM | POA: Diagnosis not present

## 2022-11-26 DIAGNOSIS — Z992 Dependence on renal dialysis: Secondary | ICD-10-CM | POA: Diagnosis not present

## 2022-11-26 DIAGNOSIS — I129 Hypertensive chronic kidney disease with stage 1 through stage 4 chronic kidney disease, or unspecified chronic kidney disease: Secondary | ICD-10-CM | POA: Diagnosis not present

## 2022-11-27 DIAGNOSIS — D689 Coagulation defect, unspecified: Secondary | ICD-10-CM | POA: Diagnosis not present

## 2022-11-27 DIAGNOSIS — Z992 Dependence on renal dialysis: Secondary | ICD-10-CM | POA: Diagnosis not present

## 2022-11-27 DIAGNOSIS — N186 End stage renal disease: Secondary | ICD-10-CM | POA: Diagnosis not present

## 2022-11-27 DIAGNOSIS — N2581 Secondary hyperparathyroidism of renal origin: Secondary | ICD-10-CM | POA: Diagnosis not present

## 2022-11-27 DIAGNOSIS — T8249XA Other complication of vascular dialysis catheter, initial encounter: Secondary | ICD-10-CM | POA: Diagnosis not present

## 2022-11-30 DIAGNOSIS — N186 End stage renal disease: Secondary | ICD-10-CM | POA: Diagnosis not present

## 2022-11-30 DIAGNOSIS — Z992 Dependence on renal dialysis: Secondary | ICD-10-CM | POA: Diagnosis not present

## 2022-11-30 DIAGNOSIS — D689 Coagulation defect, unspecified: Secondary | ICD-10-CM | POA: Diagnosis not present

## 2022-11-30 DIAGNOSIS — T8249XA Other complication of vascular dialysis catheter, initial encounter: Secondary | ICD-10-CM | POA: Diagnosis not present

## 2022-11-30 DIAGNOSIS — N2581 Secondary hyperparathyroidism of renal origin: Secondary | ICD-10-CM | POA: Diagnosis not present

## 2022-12-02 DIAGNOSIS — N2581 Secondary hyperparathyroidism of renal origin: Secondary | ICD-10-CM | POA: Diagnosis not present

## 2022-12-02 DIAGNOSIS — T8249XA Other complication of vascular dialysis catheter, initial encounter: Secondary | ICD-10-CM | POA: Diagnosis not present

## 2022-12-02 DIAGNOSIS — Z992 Dependence on renal dialysis: Secondary | ICD-10-CM | POA: Diagnosis not present

## 2022-12-02 DIAGNOSIS — N186 End stage renal disease: Secondary | ICD-10-CM | POA: Diagnosis not present

## 2022-12-02 DIAGNOSIS — D689 Coagulation defect, unspecified: Secondary | ICD-10-CM | POA: Diagnosis not present

## 2022-12-03 ENCOUNTER — Ambulatory Visit: Payer: MEDICARE | Attending: Physical Therapy | Admitting: Physical Therapy

## 2022-12-04 DIAGNOSIS — Z992 Dependence on renal dialysis: Secondary | ICD-10-CM | POA: Diagnosis not present

## 2022-12-04 DIAGNOSIS — N186 End stage renal disease: Secondary | ICD-10-CM | POA: Diagnosis not present

## 2022-12-04 DIAGNOSIS — T8249XA Other complication of vascular dialysis catheter, initial encounter: Secondary | ICD-10-CM | POA: Diagnosis not present

## 2022-12-04 DIAGNOSIS — D689 Coagulation defect, unspecified: Secondary | ICD-10-CM | POA: Diagnosis not present

## 2022-12-04 DIAGNOSIS — N2581 Secondary hyperparathyroidism of renal origin: Secondary | ICD-10-CM | POA: Diagnosis not present

## 2022-12-05 ENCOUNTER — Other Ambulatory Visit (INDEPENDENT_AMBULATORY_CARE_PROVIDER_SITE_OTHER): Payer: Self-pay | Admitting: Primary Care

## 2022-12-07 DIAGNOSIS — N2581 Secondary hyperparathyroidism of renal origin: Secondary | ICD-10-CM | POA: Diagnosis not present

## 2022-12-07 DIAGNOSIS — T8249XA Other complication of vascular dialysis catheter, initial encounter: Secondary | ICD-10-CM | POA: Diagnosis not present

## 2022-12-07 DIAGNOSIS — D689 Coagulation defect, unspecified: Secondary | ICD-10-CM | POA: Diagnosis not present

## 2022-12-07 DIAGNOSIS — N186 End stage renal disease: Secondary | ICD-10-CM | POA: Diagnosis not present

## 2022-12-07 DIAGNOSIS — Z992 Dependence on renal dialysis: Secondary | ICD-10-CM | POA: Diagnosis not present

## 2022-12-07 NOTE — Telephone Encounter (Signed)
Will forward to provider  

## 2022-12-09 ENCOUNTER — Telehealth (INDEPENDENT_AMBULATORY_CARE_PROVIDER_SITE_OTHER): Payer: Self-pay | Admitting: Primary Care

## 2022-12-09 DIAGNOSIS — T8249XA Other complication of vascular dialysis catheter, initial encounter: Secondary | ICD-10-CM | POA: Diagnosis not present

## 2022-12-09 DIAGNOSIS — N186 End stage renal disease: Secondary | ICD-10-CM | POA: Diagnosis not present

## 2022-12-09 DIAGNOSIS — Z992 Dependence on renal dialysis: Secondary | ICD-10-CM | POA: Diagnosis not present

## 2022-12-09 DIAGNOSIS — N2581 Secondary hyperparathyroidism of renal origin: Secondary | ICD-10-CM | POA: Diagnosis not present

## 2022-12-09 DIAGNOSIS — D689 Coagulation defect, unspecified: Secondary | ICD-10-CM | POA: Diagnosis not present

## 2022-12-09 NOTE — Telephone Encounter (Signed)
Attempted to reach patient for appointment reminder tomorrow. No voicemail was set up.

## 2022-12-10 ENCOUNTER — Ambulatory Visit (INDEPENDENT_AMBULATORY_CARE_PROVIDER_SITE_OTHER): Payer: Medicare HMO | Admitting: Primary Care

## 2022-12-10 ENCOUNTER — Telehealth (INDEPENDENT_AMBULATORY_CARE_PROVIDER_SITE_OTHER): Payer: Self-pay | Admitting: Primary Care

## 2022-12-10 NOTE — Telephone Encounter (Signed)
Was not able to reach patient. No voicemail setup.

## 2022-12-11 DIAGNOSIS — D689 Coagulation defect, unspecified: Secondary | ICD-10-CM | POA: Diagnosis not present

## 2022-12-11 DIAGNOSIS — Z992 Dependence on renal dialysis: Secondary | ICD-10-CM | POA: Diagnosis not present

## 2022-12-11 DIAGNOSIS — T8249XA Other complication of vascular dialysis catheter, initial encounter: Secondary | ICD-10-CM | POA: Diagnosis not present

## 2022-12-11 DIAGNOSIS — N2581 Secondary hyperparathyroidism of renal origin: Secondary | ICD-10-CM | POA: Diagnosis not present

## 2022-12-11 DIAGNOSIS — N186 End stage renal disease: Secondary | ICD-10-CM | POA: Diagnosis not present

## 2022-12-14 DIAGNOSIS — D689 Coagulation defect, unspecified: Secondary | ICD-10-CM | POA: Diagnosis not present

## 2022-12-14 DIAGNOSIS — N2581 Secondary hyperparathyroidism of renal origin: Secondary | ICD-10-CM | POA: Diagnosis not present

## 2022-12-14 DIAGNOSIS — N186 End stage renal disease: Secondary | ICD-10-CM | POA: Diagnosis not present

## 2022-12-14 DIAGNOSIS — Z992 Dependence on renal dialysis: Secondary | ICD-10-CM | POA: Diagnosis not present

## 2022-12-14 DIAGNOSIS — T8249XA Other complication of vascular dialysis catheter, initial encounter: Secondary | ICD-10-CM | POA: Diagnosis not present

## 2022-12-15 ENCOUNTER — Ambulatory Visit (INDEPENDENT_AMBULATORY_CARE_PROVIDER_SITE_OTHER): Payer: Medicare HMO | Admitting: Primary Care

## 2022-12-15 ENCOUNTER — Encounter (INDEPENDENT_AMBULATORY_CARE_PROVIDER_SITE_OTHER): Payer: Self-pay | Admitting: Primary Care

## 2022-12-15 VITALS — BP 140/92

## 2022-12-15 DIAGNOSIS — Z992 Dependence on renal dialysis: Secondary | ICD-10-CM

## 2022-12-15 DIAGNOSIS — N2581 Secondary hyperparathyroidism of renal origin: Secondary | ICD-10-CM

## 2022-12-15 DIAGNOSIS — E1051 Type 1 diabetes mellitus with diabetic peripheral angiopathy without gangrene: Secondary | ICD-10-CM

## 2022-12-15 DIAGNOSIS — N186 End stage renal disease: Secondary | ICD-10-CM

## 2022-12-15 NOTE — Progress Notes (Signed)
[place]  [name], is a 42 y.o. male  GHW:[id number]  VEL:[id number]  DOB - 1981/02/01  No chief complaint on file.      Subjective:   [name] is a 42 y.o. male here today for a follow up visit for type 1 diabetes. He denies polyuria, polydipsia, polyphagia or vision changes . Bp elevated initially . Patient has No headache, No chest pain, No abdominal pain - No Nausea, No new weakness tingling or numbness, No Cough - shortness of breath.  He is on dialysis 3 days a week.  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 No problems updated.  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     cinacalcet (SENSIPAR) 60 MG tablet Take 120 mg by mouth every evening.     gabapentin (NEURONTIN) 100 MG capsule TAKE 1 CAPSULE BY MOUTH THREE TIMES DAILY 90 capsule 0   insulin NPH-regular Human (NOVOLIN 70/30) (70-30) 100 UNIT/ML injection Inject 10 Units into the skin 2 (two) times daily with a meal. 10 mL 6   multivitamin (RENA-VIT) TABS tablet Take 1 tablet by mouth at bedtime.  0   NOVOLIN R RELION 100 UNIT/ML injection INJECT 12 UNITS SUBCUTANEOUSLY THREE TIMES DAILY BEFORE MEAL(S) 10 mL 0   sevelamer carbonate (RENVELA) 800 MG tablet Take 2,400-4,000 mg by mouth See admin instructions. Take 4,000 mg (5 tablets) by mouth with meals and 2,400 mg (3 tablets) with snacks      No current facility-administered medications on file prior to visit.    Objective:  There were no vitals filed for this visit.  Comprehensive ROS Pertinent positive and negative noted in HPI   Exam General appearance : Awake, alert, not in any distress. Speech Clear. Not toxic looking HEENT: Atraumatic and Normocephalic, pupils equally reactive to light and accomodation Neck: Supple, no JVD. No cervical lymphadenopathy.  Chest: Good air entry bilaterally, no added sounds  CVS: S1 S2 regular, no murmurs.  Abdomen: Bowel sounds present, Non tender and not distended with no gaurding, rigidity or rebound. 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  He manages his on diabetes and insulin regiment states has it all his life and knows how to manage his blood sugars.  ESRD on dialysis [facility]) Followed by nephrology  Secondary hyperparathyroidism of renal origin [facility])    Patient have  been counseled extensively about nutrition and exercise. Other issues discussed during this visit include: low cholesterol diet, weight control and daily exercise, foot care, annual eye examinations at Ophthalmology, importance of adherence with medications and regular follow-up. We also discussed long term complications of uncontrolled diabetes and hypertension.   The patient was given clear instructions to go to ER or return to medical center if symptoms don't improve, worsen or new problems develop. The patient verbalized understanding. The patient was told to call to  get lab results if they haven't heard anything in the next week.   This note has been created with Surveyor, quantity. Any transcriptional errors are unintentional.   Kerin Perna, NP 12/15/2022, 12:36 PM

## 2022-12-16 ENCOUNTER — Telehealth: Payer: Self-pay | Admitting: Emergency Medicine

## 2022-12-16 DIAGNOSIS — Z992 Dependence on renal dialysis: Secondary | ICD-10-CM | POA: Diagnosis not present

## 2022-12-16 DIAGNOSIS — N186 End stage renal disease: Secondary | ICD-10-CM | POA: Diagnosis not present

## 2022-12-16 DIAGNOSIS — N2581 Secondary hyperparathyroidism of renal origin: Secondary | ICD-10-CM | POA: Diagnosis not present

## 2022-12-16 DIAGNOSIS — T8249XA Other complication of vascular dialysis catheter, initial encounter: Secondary | ICD-10-CM | POA: Diagnosis not present

## 2022-12-16 DIAGNOSIS — D689 Coagulation defect, unspecified: Secondary | ICD-10-CM | POA: Diagnosis not present

## 2022-12-16 NOTE — Telephone Encounter (Signed)
Copied from [place] [id number]. Topic: General - Inquiry >> Dec 16, 2022  1:12 PM Marcellus Scott wrote: Reason for CRM: Pt is requesting a callback from PCP Ms.Edwards directly. Declined to provide further details.  Please advise.

## 2022-12-18 ENCOUNTER — Other Ambulatory Visit (INDEPENDENT_AMBULATORY_CARE_PROVIDER_SITE_OTHER): Payer: Self-pay | Admitting: Primary Care

## 2022-12-18 DIAGNOSIS — Z992 Dependence on renal dialysis: Secondary | ICD-10-CM | POA: Diagnosis not present

## 2022-12-18 DIAGNOSIS — N2581 Secondary hyperparathyroidism of renal origin: Secondary | ICD-10-CM | POA: Diagnosis not present

## 2022-12-18 DIAGNOSIS — N186 End stage renal disease: Secondary | ICD-10-CM | POA: Diagnosis not present

## 2022-12-18 DIAGNOSIS — D689 Coagulation defect, unspecified: Secondary | ICD-10-CM | POA: Diagnosis not present

## 2022-12-18 DIAGNOSIS — T8249XA Other complication of vascular dialysis catheter, initial encounter: Secondary | ICD-10-CM | POA: Diagnosis not present

## 2022-12-18 IMAGING — MR MR [PERSON_NAME] LOW W/O CM*R*
4 of 5 series · 11 of 40 positions shown · non-contrast
Comparison: X-ray 05/19/2021

CLINICAL DATA: Osteomyelitis suspected, tib/fib, xray done

EXAM:
MRI OF LOWER RIGHT EXTREMITY WITHOUT CONTRAST
TECHNIQUE: Multiplanar, multisequence MR imaging of the right tibia fibula was
performed. No intravenous contrast was administered.

[Series 5: STIR · sagittal · 4.0mm · 0.98mm/px · 2 of 27 slices shown]
[im 1/27]
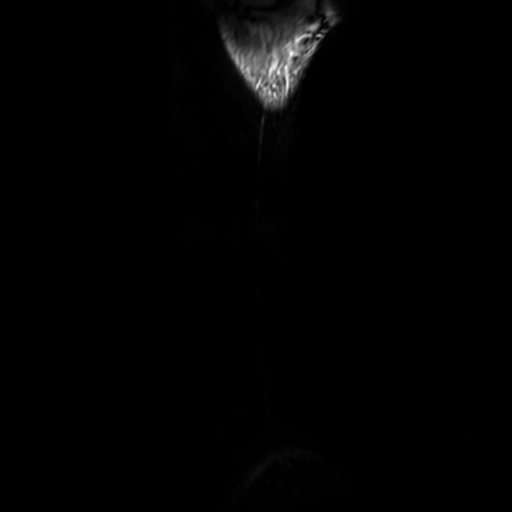
[im 14/27]
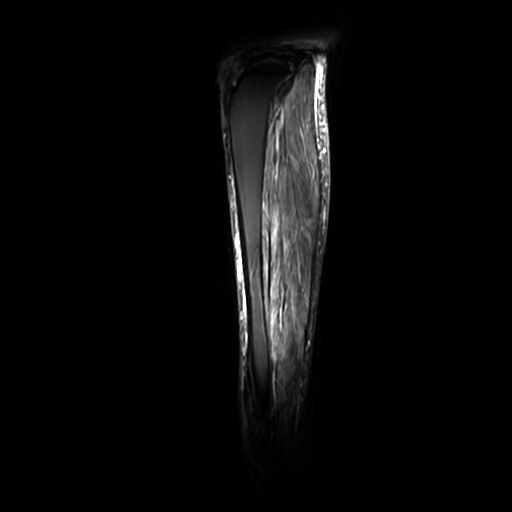

[Series 7: T1 · coronal · 4.0mm · 0.43mm/px · 3 of 24 slices shown (1 of 2)]
[im 1/24]
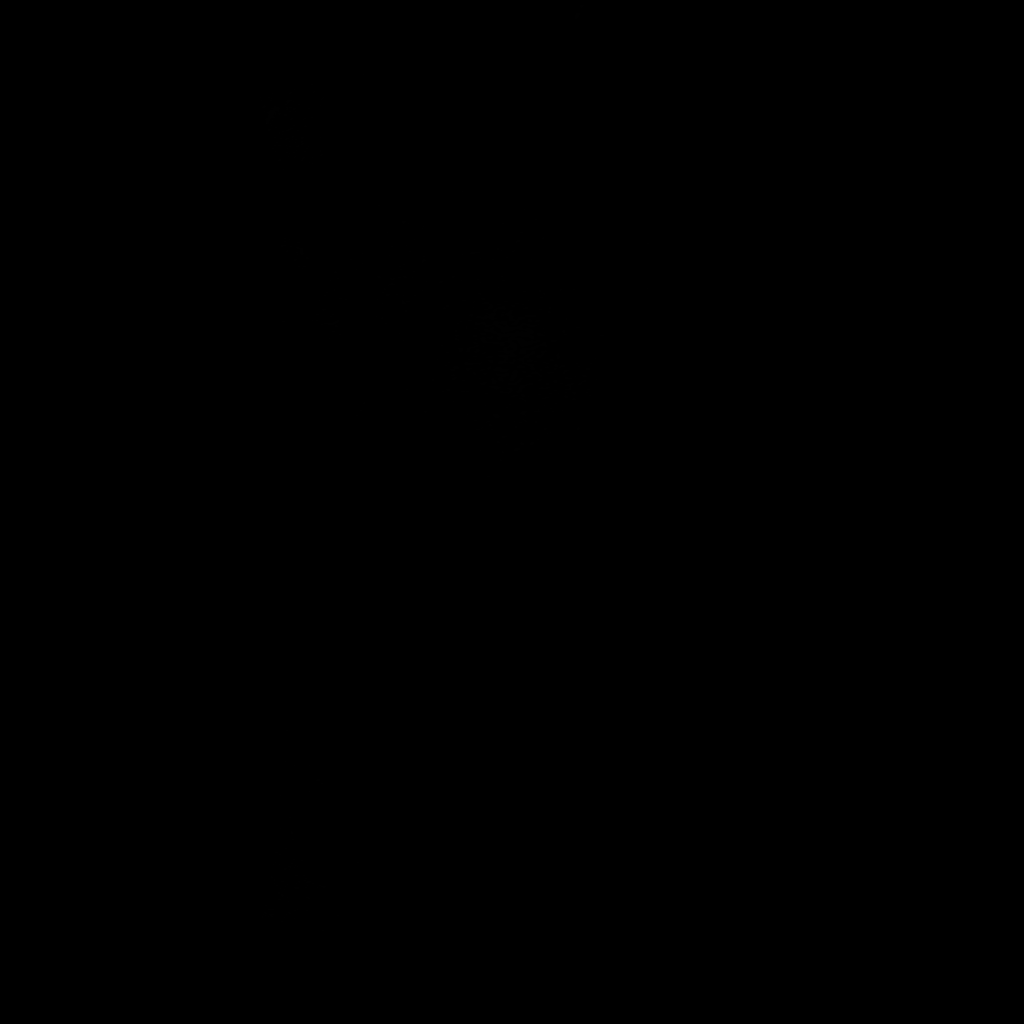
[im 16/24]
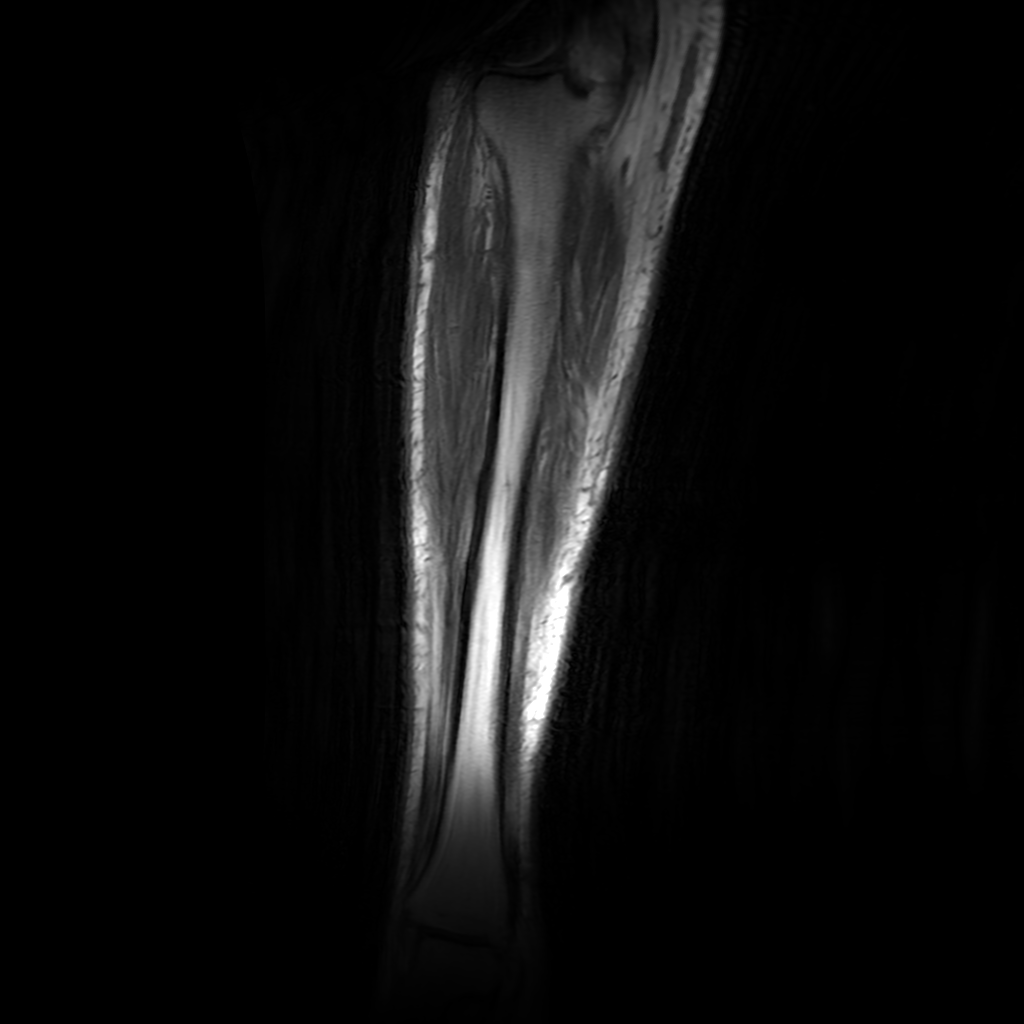
[im 24/24]
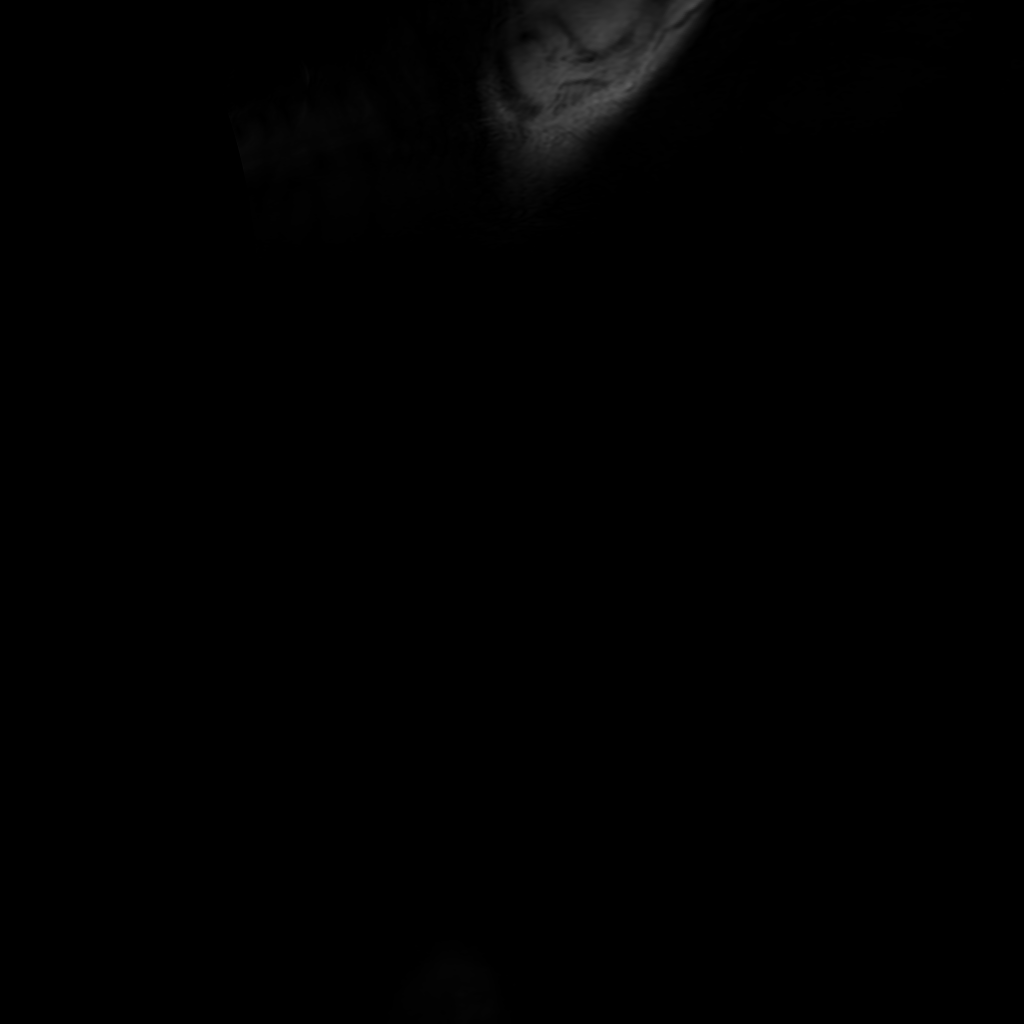

[Series 8: T1 · axial · 4.0mm · 0.31mm/px · z∈[-263,+86]mm · 3 of 99 slices shown (2 of 2)]
[im 15/99]
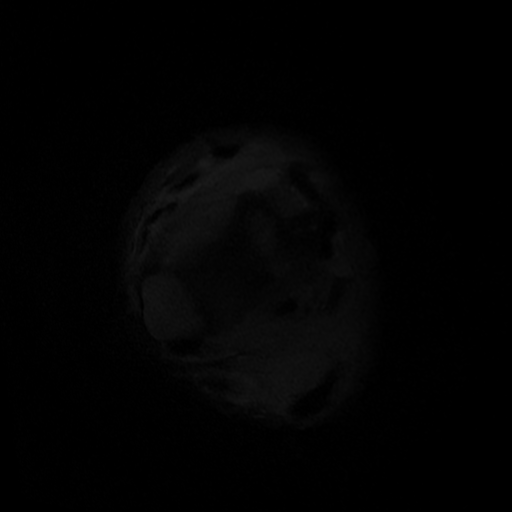
[im 50/99]
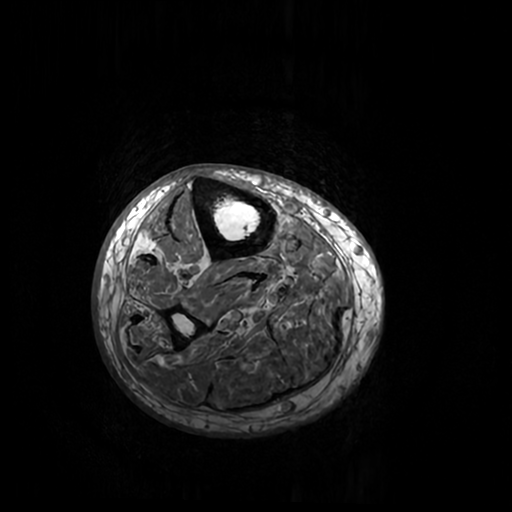
[im 85/99]
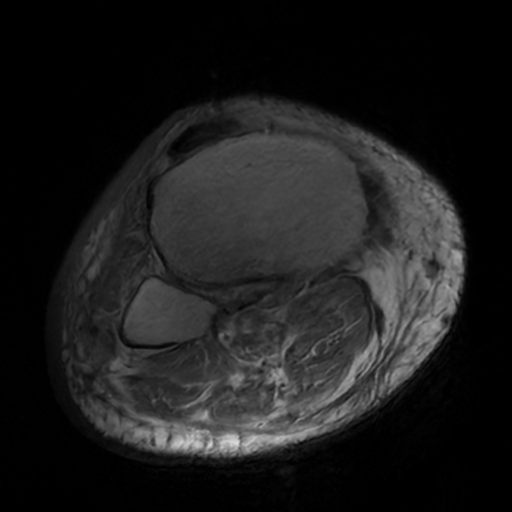

[Series 9: T2 fat-sat · axial · 4.0mm · 0.31mm/px · z∈[-263,+86]mm · 3 of 99 slices shown]
[im 15/99]
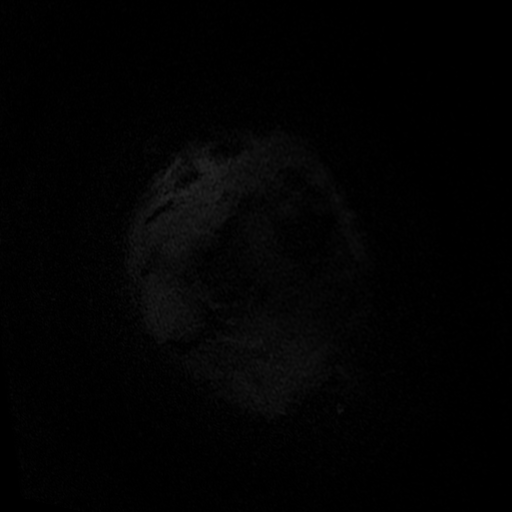
[im 50/99]
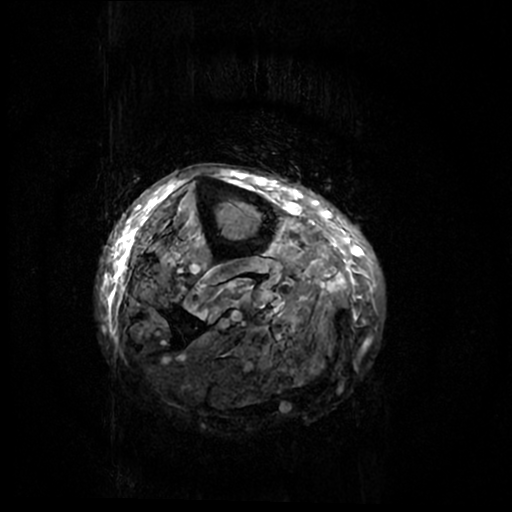
[im 85/99]
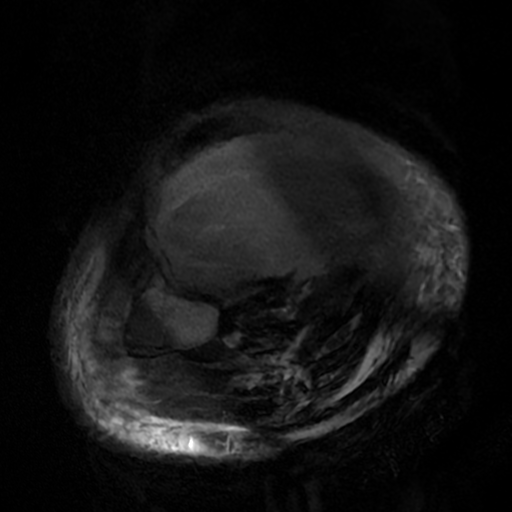

[11 of 40 positions shown; findings below may reference images not displayed]

FINDINGS: Technical note: Despite efforts by the technologist and patient,
motion artifact is present on today's exam and could not be
eliminated. This reduces exam sensitivity and specificity.

Bones/Joint/Cartilage

No acute fracture. No evidence of bony destruction. No marrow
replacement. No bone marrow edema or periostitis.

Ligaments

Not well evaluated at the edges of the field of view.

Muscles and Tendons

Diffuse fatty infiltration of the lower leg musculature suggesting
denervation. Mildly increased T2 signal within the musculature of
the mid to distal aspect of the lower leg. No intramuscular fluid
collection. No appreciable tendinous abnormality on limited views.

Soft tissues

Subcutaneous edema and ill-defined fluid is most pronounced along
the medial and anterior aspects of the mid to distal lower leg. No
organized fluid collection.
IMPRESSION: 1. Motion degraded exam.
2. No evidence of osteomyelitis of the right tibia or fibula.
3. Subcutaneous edema and ill-defined fluid is most pronounced along
the medial and anterior aspects of the mid to distal lower leg,
which may represent cellulitis. No organized fluid collection.
4. Diffuse fatty infiltration of the lower leg musculature
suggesting denervation. Mildly increased T2 signal within the
musculature of the mid to distal aspect of the lower leg may
represent a combination of denervation changes and/or myositis.

## 2022-12-18 MED ORDER — INSULIN REGULAR HUMAN 100 UNIT/ML IJ SOLN: INTRAMUSCULAR | 3 refills | Status: DC

## 2022-12-18 MED ORDER — NOVOLIN 70/30 (70-30) 100 UNIT/ML ~~LOC~~ SUSP: 10.0000 [IU] | Freq: Two times a day (BID) | SUBCUTANEOUS | 6 refills | Status: DC

## 2022-12-18 MED ORDER — GABAPENTIN 100 MG PO CAPS: 100.0000 mg | ORAL_CAPSULE | Freq: Three times a day (TID) | ORAL | 1 refills | Status: DC

## 2022-12-18 NOTE — Telephone Encounter (Signed)
Spoke with patient resolved 

## 2022-12-21 DIAGNOSIS — D689 Coagulation defect, unspecified: Secondary | ICD-10-CM | POA: Diagnosis not present

## 2022-12-21 DIAGNOSIS — N2581 Secondary hyperparathyroidism of renal origin: Secondary | ICD-10-CM | POA: Diagnosis not present

## 2022-12-21 DIAGNOSIS — Z992 Dependence on renal dialysis: Secondary | ICD-10-CM | POA: Diagnosis not present

## 2022-12-21 DIAGNOSIS — N186 End stage renal disease: Secondary | ICD-10-CM | POA: Diagnosis not present

## 2022-12-21 DIAGNOSIS — T8249XA Other complication of vascular dialysis catheter, initial encounter: Secondary | ICD-10-CM | POA: Diagnosis not present

## 2022-12-23 DIAGNOSIS — D689 Coagulation defect, unspecified: Secondary | ICD-10-CM | POA: Diagnosis not present

## 2022-12-23 DIAGNOSIS — N2581 Secondary hyperparathyroidism of renal origin: Secondary | ICD-10-CM | POA: Diagnosis not present

## 2022-12-23 DIAGNOSIS — Z992 Dependence on renal dialysis: Secondary | ICD-10-CM | POA: Diagnosis not present

## 2022-12-23 DIAGNOSIS — N186 End stage renal disease: Secondary | ICD-10-CM | POA: Diagnosis not present

## 2022-12-23 DIAGNOSIS — T8249XA Other complication of vascular dialysis catheter, initial encounter: Secondary | ICD-10-CM | POA: Diagnosis not present

## 2022-12-25 DIAGNOSIS — Z992 Dependence on renal dialysis: Secondary | ICD-10-CM | POA: Diagnosis not present

## 2022-12-25 DIAGNOSIS — D689 Coagulation defect, unspecified: Secondary | ICD-10-CM | POA: Diagnosis not present

## 2022-12-25 DIAGNOSIS — T8249XA Other complication of vascular dialysis catheter, initial encounter: Secondary | ICD-10-CM | POA: Diagnosis not present

## 2022-12-25 DIAGNOSIS — N2581 Secondary hyperparathyroidism of renal origin: Secondary | ICD-10-CM | POA: Diagnosis not present

## 2022-12-25 DIAGNOSIS — N186 End stage renal disease: Secondary | ICD-10-CM | POA: Diagnosis not present

## 2022-12-27 DIAGNOSIS — I129 Hypertensive chronic kidney disease with stage 1 through stage 4 chronic kidney disease, or unspecified chronic kidney disease: Secondary | ICD-10-CM | POA: Diagnosis not present

## 2022-12-27 DIAGNOSIS — N186 End stage renal disease: Secondary | ICD-10-CM | POA: Diagnosis not present

## 2022-12-27 DIAGNOSIS — Z992 Dependence on renal dialysis: Secondary | ICD-10-CM | POA: Diagnosis not present

## 2022-12-28 DIAGNOSIS — D689 Coagulation defect, unspecified: Secondary | ICD-10-CM | POA: Diagnosis not present

## 2022-12-28 DIAGNOSIS — E1122 Type 2 diabetes mellitus with diabetic chronic kidney disease: Secondary | ICD-10-CM | POA: Diagnosis not present

## 2022-12-28 DIAGNOSIS — I159 Secondary hypertension, unspecified: Secondary | ICD-10-CM | POA: Diagnosis not present

## 2022-12-28 DIAGNOSIS — D631 Anemia in chronic kidney disease: Secondary | ICD-10-CM | POA: Diagnosis not present

## 2022-12-28 DIAGNOSIS — T8249XA Other complication of vascular dialysis catheter, initial encounter: Secondary | ICD-10-CM | POA: Diagnosis not present

## 2022-12-28 DIAGNOSIS — N186 End stage renal disease: Secondary | ICD-10-CM | POA: Diagnosis not present

## 2022-12-28 DIAGNOSIS — Z992 Dependence on renal dialysis: Secondary | ICD-10-CM | POA: Diagnosis not present

## 2022-12-28 DIAGNOSIS — N2581 Secondary hyperparathyroidism of renal origin: Secondary | ICD-10-CM | POA: Diagnosis not present

## 2022-12-30 DIAGNOSIS — Z992 Dependence on renal dialysis: Secondary | ICD-10-CM | POA: Diagnosis not present

## 2022-12-30 DIAGNOSIS — D689 Coagulation defect, unspecified: Secondary | ICD-10-CM | POA: Diagnosis not present

## 2022-12-30 DIAGNOSIS — E1122 Type 2 diabetes mellitus with diabetic chronic kidney disease: Secondary | ICD-10-CM | POA: Diagnosis not present

## 2022-12-30 DIAGNOSIS — N186 End stage renal disease: Secondary | ICD-10-CM | POA: Diagnosis not present

## 2022-12-30 DIAGNOSIS — D631 Anemia in chronic kidney disease: Secondary | ICD-10-CM | POA: Diagnosis not present

## 2022-12-30 DIAGNOSIS — I159 Secondary hypertension, unspecified: Secondary | ICD-10-CM | POA: Diagnosis not present

## 2022-12-30 DIAGNOSIS — N2581 Secondary hyperparathyroidism of renal origin: Secondary | ICD-10-CM | POA: Diagnosis not present

## 2022-12-30 DIAGNOSIS — T8249XA Other complication of vascular dialysis catheter, initial encounter: Secondary | ICD-10-CM | POA: Diagnosis not present

## 2023-01-01 DIAGNOSIS — E1122 Type 2 diabetes mellitus with diabetic chronic kidney disease: Secondary | ICD-10-CM | POA: Diagnosis not present

## 2023-01-01 DIAGNOSIS — I159 Secondary hypertension, unspecified: Secondary | ICD-10-CM | POA: Diagnosis not present

## 2023-01-01 DIAGNOSIS — D631 Anemia in chronic kidney disease: Secondary | ICD-10-CM | POA: Diagnosis not present

## 2023-01-01 DIAGNOSIS — T8249XA Other complication of vascular dialysis catheter, initial encounter: Secondary | ICD-10-CM | POA: Diagnosis not present

## 2023-01-01 DIAGNOSIS — Z992 Dependence on renal dialysis: Secondary | ICD-10-CM | POA: Diagnosis not present

## 2023-01-01 DIAGNOSIS — D689 Coagulation defect, unspecified: Secondary | ICD-10-CM | POA: Diagnosis not present

## 2023-01-01 DIAGNOSIS — N2581 Secondary hyperparathyroidism of renal origin: Secondary | ICD-10-CM | POA: Diagnosis not present

## 2023-01-01 DIAGNOSIS — N186 End stage renal disease: Secondary | ICD-10-CM | POA: Diagnosis not present

## 2023-01-05 DIAGNOSIS — E1122 Type 2 diabetes mellitus with diabetic chronic kidney disease: Secondary | ICD-10-CM | POA: Diagnosis not present

## 2023-01-05 DIAGNOSIS — I159 Secondary hypertension, unspecified: Secondary | ICD-10-CM | POA: Diagnosis not present

## 2023-01-05 DIAGNOSIS — D631 Anemia in chronic kidney disease: Secondary | ICD-10-CM | POA: Diagnosis not present

## 2023-01-05 DIAGNOSIS — T8249XA Other complication of vascular dialysis catheter, initial encounter: Secondary | ICD-10-CM | POA: Diagnosis not present

## 2023-01-05 DIAGNOSIS — N2581 Secondary hyperparathyroidism of renal origin: Secondary | ICD-10-CM | POA: Diagnosis not present

## 2023-01-05 DIAGNOSIS — Z992 Dependence on renal dialysis: Secondary | ICD-10-CM | POA: Diagnosis not present

## 2023-01-05 DIAGNOSIS — D689 Coagulation defect, unspecified: Secondary | ICD-10-CM | POA: Diagnosis not present

## 2023-01-05 DIAGNOSIS — N186 End stage renal disease: Secondary | ICD-10-CM | POA: Diagnosis not present

## 2023-01-06 DIAGNOSIS — T8249XA Other complication of vascular dialysis catheter, initial encounter: Secondary | ICD-10-CM | POA: Diagnosis not present

## 2023-01-06 DIAGNOSIS — Z992 Dependence on renal dialysis: Secondary | ICD-10-CM | POA: Diagnosis not present

## 2023-01-06 DIAGNOSIS — D631 Anemia in chronic kidney disease: Secondary | ICD-10-CM | POA: Diagnosis not present

## 2023-01-06 DIAGNOSIS — D689 Coagulation defect, unspecified: Secondary | ICD-10-CM | POA: Diagnosis not present

## 2023-01-06 DIAGNOSIS — I159 Secondary hypertension, unspecified: Secondary | ICD-10-CM | POA: Diagnosis not present

## 2023-01-06 DIAGNOSIS — N2581 Secondary hyperparathyroidism of renal origin: Secondary | ICD-10-CM | POA: Diagnosis not present

## 2023-01-06 DIAGNOSIS — N186 End stage renal disease: Secondary | ICD-10-CM | POA: Diagnosis not present

## 2023-01-06 DIAGNOSIS — E1122 Type 2 diabetes mellitus with diabetic chronic kidney disease: Secondary | ICD-10-CM | POA: Diagnosis not present

## 2023-01-08 DIAGNOSIS — I159 Secondary hypertension, unspecified: Secondary | ICD-10-CM | POA: Diagnosis not present

## 2023-01-08 DIAGNOSIS — Z992 Dependence on renal dialysis: Secondary | ICD-10-CM | POA: Diagnosis not present

## 2023-01-08 DIAGNOSIS — E1122 Type 2 diabetes mellitus with diabetic chronic kidney disease: Secondary | ICD-10-CM | POA: Diagnosis not present

## 2023-01-08 DIAGNOSIS — D631 Anemia in chronic kidney disease: Secondary | ICD-10-CM | POA: Diagnosis not present

## 2023-01-08 DIAGNOSIS — N2581 Secondary hyperparathyroidism of renal origin: Secondary | ICD-10-CM | POA: Diagnosis not present

## 2023-01-08 DIAGNOSIS — D689 Coagulation defect, unspecified: Secondary | ICD-10-CM | POA: Diagnosis not present

## 2023-01-08 DIAGNOSIS — N186 End stage renal disease: Secondary | ICD-10-CM | POA: Diagnosis not present

## 2023-01-08 DIAGNOSIS — T8249XA Other complication of vascular dialysis catheter, initial encounter: Secondary | ICD-10-CM | POA: Diagnosis not present

## 2023-01-11 DIAGNOSIS — D689 Coagulation defect, unspecified: Secondary | ICD-10-CM | POA: Diagnosis not present

## 2023-01-11 DIAGNOSIS — T8249XA Other complication of vascular dialysis catheter, initial encounter: Secondary | ICD-10-CM | POA: Diagnosis not present

## 2023-01-11 DIAGNOSIS — N2581 Secondary hyperparathyroidism of renal origin: Secondary | ICD-10-CM | POA: Diagnosis not present

## 2023-01-11 DIAGNOSIS — Z992 Dependence on renal dialysis: Secondary | ICD-10-CM | POA: Diagnosis not present

## 2023-01-11 DIAGNOSIS — E1122 Type 2 diabetes mellitus with diabetic chronic kidney disease: Secondary | ICD-10-CM | POA: Diagnosis not present

## 2023-01-11 DIAGNOSIS — D631 Anemia in chronic kidney disease: Secondary | ICD-10-CM | POA: Diagnosis not present

## 2023-01-11 DIAGNOSIS — I159 Secondary hypertension, unspecified: Secondary | ICD-10-CM | POA: Diagnosis not present

## 2023-01-11 DIAGNOSIS — N186 End stage renal disease: Secondary | ICD-10-CM | POA: Diagnosis not present

## 2023-01-13 DIAGNOSIS — Z992 Dependence on renal dialysis: Secondary | ICD-10-CM | POA: Diagnosis not present

## 2023-01-13 DIAGNOSIS — E1122 Type 2 diabetes mellitus with diabetic chronic kidney disease: Secondary | ICD-10-CM | POA: Diagnosis not present

## 2023-01-13 DIAGNOSIS — I159 Secondary hypertension, unspecified: Secondary | ICD-10-CM | POA: Diagnosis not present

## 2023-01-13 DIAGNOSIS — T8249XA Other complication of vascular dialysis catheter, initial encounter: Secondary | ICD-10-CM | POA: Diagnosis not present

## 2023-01-13 DIAGNOSIS — D689 Coagulation defect, unspecified: Secondary | ICD-10-CM | POA: Diagnosis not present

## 2023-01-13 DIAGNOSIS — D631 Anemia in chronic kidney disease: Secondary | ICD-10-CM | POA: Diagnosis not present

## 2023-01-13 DIAGNOSIS — N2581 Secondary hyperparathyroidism of renal origin: Secondary | ICD-10-CM | POA: Diagnosis not present

## 2023-01-13 DIAGNOSIS — N186 End stage renal disease: Secondary | ICD-10-CM | POA: Diagnosis not present

## 2023-01-15 DIAGNOSIS — I159 Secondary hypertension, unspecified: Secondary | ICD-10-CM | POA: Diagnosis not present

## 2023-01-15 DIAGNOSIS — Z992 Dependence on renal dialysis: Secondary | ICD-10-CM | POA: Diagnosis not present

## 2023-01-15 DIAGNOSIS — D631 Anemia in chronic kidney disease: Secondary | ICD-10-CM | POA: Diagnosis not present

## 2023-01-15 DIAGNOSIS — E1122 Type 2 diabetes mellitus with diabetic chronic kidney disease: Secondary | ICD-10-CM | POA: Diagnosis not present

## 2023-01-15 DIAGNOSIS — D689 Coagulation defect, unspecified: Secondary | ICD-10-CM | POA: Diagnosis not present

## 2023-01-15 DIAGNOSIS — T8249XA Other complication of vascular dialysis catheter, initial encounter: Secondary | ICD-10-CM | POA: Diagnosis not present

## 2023-01-15 DIAGNOSIS — N186 End stage renal disease: Secondary | ICD-10-CM | POA: Diagnosis not present

## 2023-01-15 DIAGNOSIS — N2581 Secondary hyperparathyroidism of renal origin: Secondary | ICD-10-CM | POA: Diagnosis not present

## 2023-01-18 DIAGNOSIS — I159 Secondary hypertension, unspecified: Secondary | ICD-10-CM | POA: Diagnosis not present

## 2023-01-18 DIAGNOSIS — N186 End stage renal disease: Secondary | ICD-10-CM | POA: Diagnosis not present

## 2023-01-18 DIAGNOSIS — D631 Anemia in chronic kidney disease: Secondary | ICD-10-CM | POA: Diagnosis not present

## 2023-01-18 DIAGNOSIS — T8249XA Other complication of vascular dialysis catheter, initial encounter: Secondary | ICD-10-CM | POA: Diagnosis not present

## 2023-01-18 DIAGNOSIS — Z992 Dependence on renal dialysis: Secondary | ICD-10-CM | POA: Diagnosis not present

## 2023-01-18 DIAGNOSIS — N2581 Secondary hyperparathyroidism of renal origin: Secondary | ICD-10-CM | POA: Diagnosis not present

## 2023-01-18 DIAGNOSIS — E1122 Type 2 diabetes mellitus with diabetic chronic kidney disease: Secondary | ICD-10-CM | POA: Diagnosis not present

## 2023-01-18 DIAGNOSIS — D689 Coagulation defect, unspecified: Secondary | ICD-10-CM | POA: Diagnosis not present

## 2023-01-20 DIAGNOSIS — N2581 Secondary hyperparathyroidism of renal origin: Secondary | ICD-10-CM | POA: Diagnosis not present

## 2023-01-20 DIAGNOSIS — N186 End stage renal disease: Secondary | ICD-10-CM | POA: Diagnosis not present

## 2023-01-20 DIAGNOSIS — E1122 Type 2 diabetes mellitus with diabetic chronic kidney disease: Secondary | ICD-10-CM | POA: Diagnosis not present

## 2023-01-20 DIAGNOSIS — I159 Secondary hypertension, unspecified: Secondary | ICD-10-CM | POA: Diagnosis not present

## 2023-01-20 DIAGNOSIS — D689 Coagulation defect, unspecified: Secondary | ICD-10-CM | POA: Diagnosis not present

## 2023-01-20 DIAGNOSIS — T8249XA Other complication of vascular dialysis catheter, initial encounter: Secondary | ICD-10-CM | POA: Diagnosis not present

## 2023-01-20 DIAGNOSIS — Z992 Dependence on renal dialysis: Secondary | ICD-10-CM | POA: Diagnosis not present

## 2023-01-20 DIAGNOSIS — D631 Anemia in chronic kidney disease: Secondary | ICD-10-CM | POA: Diagnosis not present

## 2023-01-22 DIAGNOSIS — N186 End stage renal disease: Secondary | ICD-10-CM | POA: Diagnosis not present

## 2023-01-22 DIAGNOSIS — T8249XA Other complication of vascular dialysis catheter, initial encounter: Secondary | ICD-10-CM | POA: Diagnosis not present

## 2023-01-22 DIAGNOSIS — E1122 Type 2 diabetes mellitus with diabetic chronic kidney disease: Secondary | ICD-10-CM | POA: Diagnosis not present

## 2023-01-22 DIAGNOSIS — Z992 Dependence on renal dialysis: Secondary | ICD-10-CM | POA: Diagnosis not present

## 2023-01-22 DIAGNOSIS — D631 Anemia in chronic kidney disease: Secondary | ICD-10-CM | POA: Diagnosis not present

## 2023-01-22 DIAGNOSIS — I159 Secondary hypertension, unspecified: Secondary | ICD-10-CM | POA: Diagnosis not present

## 2023-01-22 DIAGNOSIS — D689 Coagulation defect, unspecified: Secondary | ICD-10-CM | POA: Diagnosis not present

## 2023-01-22 DIAGNOSIS — N2581 Secondary hyperparathyroidism of renal origin: Secondary | ICD-10-CM | POA: Diagnosis not present

## 2023-01-25 DIAGNOSIS — D689 Coagulation defect, unspecified: Secondary | ICD-10-CM | POA: Diagnosis not present

## 2023-01-25 DIAGNOSIS — T8249XA Other complication of vascular dialysis catheter, initial encounter: Secondary | ICD-10-CM | POA: Diagnosis not present

## 2023-01-25 DIAGNOSIS — E1122 Type 2 diabetes mellitus with diabetic chronic kidney disease: Secondary | ICD-10-CM | POA: Diagnosis not present

## 2023-01-25 DIAGNOSIS — N186 End stage renal disease: Secondary | ICD-10-CM | POA: Diagnosis not present

## 2023-01-25 DIAGNOSIS — I159 Secondary hypertension, unspecified: Secondary | ICD-10-CM | POA: Diagnosis not present

## 2023-01-25 DIAGNOSIS — Z992 Dependence on renal dialysis: Secondary | ICD-10-CM | POA: Diagnosis not present

## 2023-01-25 DIAGNOSIS — N2581 Secondary hyperparathyroidism of renal origin: Secondary | ICD-10-CM | POA: Diagnosis not present

## 2023-01-25 DIAGNOSIS — D631 Anemia in chronic kidney disease: Secondary | ICD-10-CM | POA: Diagnosis not present

## 2023-01-26 DIAGNOSIS — N186 End stage renal disease: Secondary | ICD-10-CM | POA: Diagnosis not present

## 2023-01-26 DIAGNOSIS — Z992 Dependence on renal dialysis: Secondary | ICD-10-CM | POA: Diagnosis not present

## 2023-01-26 DIAGNOSIS — I129 Hypertensive chronic kidney disease with stage 1 through stage 4 chronic kidney disease, or unspecified chronic kidney disease: Secondary | ICD-10-CM | POA: Diagnosis not present

## 2023-01-27 DIAGNOSIS — D631 Anemia in chronic kidney disease: Secondary | ICD-10-CM | POA: Diagnosis not present

## 2023-01-27 DIAGNOSIS — N186 End stage renal disease: Secondary | ICD-10-CM | POA: Diagnosis not present

## 2023-01-27 DIAGNOSIS — Z992 Dependence on renal dialysis: Secondary | ICD-10-CM | POA: Diagnosis not present

## 2023-01-27 DIAGNOSIS — D689 Coagulation defect, unspecified: Secondary | ICD-10-CM | POA: Diagnosis not present

## 2023-01-27 DIAGNOSIS — T8249XA Other complication of vascular dialysis catheter, initial encounter: Secondary | ICD-10-CM | POA: Diagnosis not present

## 2023-01-27 DIAGNOSIS — N2581 Secondary hyperparathyroidism of renal origin: Secondary | ICD-10-CM | POA: Diagnosis not present

## 2023-01-29 DIAGNOSIS — D631 Anemia in chronic kidney disease: Secondary | ICD-10-CM | POA: Diagnosis not present

## 2023-01-29 DIAGNOSIS — N2581 Secondary hyperparathyroidism of renal origin: Secondary | ICD-10-CM | POA: Diagnosis not present

## 2023-01-29 DIAGNOSIS — D689 Coagulation defect, unspecified: Secondary | ICD-10-CM | POA: Diagnosis not present

## 2023-01-29 DIAGNOSIS — T8249XA Other complication of vascular dialysis catheter, initial encounter: Secondary | ICD-10-CM | POA: Diagnosis not present

## 2023-01-29 DIAGNOSIS — Z992 Dependence on renal dialysis: Secondary | ICD-10-CM | POA: Diagnosis not present

## 2023-01-29 DIAGNOSIS — N186 End stage renal disease: Secondary | ICD-10-CM | POA: Diagnosis not present

## 2023-02-01 DIAGNOSIS — T8249XA Other complication of vascular dialysis catheter, initial encounter: Secondary | ICD-10-CM | POA: Diagnosis not present

## 2023-02-01 DIAGNOSIS — Z992 Dependence on renal dialysis: Secondary | ICD-10-CM | POA: Diagnosis not present

## 2023-02-01 DIAGNOSIS — N186 End stage renal disease: Secondary | ICD-10-CM | POA: Diagnosis not present

## 2023-02-01 DIAGNOSIS — D689 Coagulation defect, unspecified: Secondary | ICD-10-CM | POA: Diagnosis not present

## 2023-02-01 DIAGNOSIS — D631 Anemia in chronic kidney disease: Secondary | ICD-10-CM | POA: Diagnosis not present

## 2023-02-01 DIAGNOSIS — N2581 Secondary hyperparathyroidism of renal origin: Secondary | ICD-10-CM | POA: Diagnosis not present

## 2023-02-03 DIAGNOSIS — D631 Anemia in chronic kidney disease: Secondary | ICD-10-CM | POA: Diagnosis not present

## 2023-02-03 DIAGNOSIS — T8249XA Other complication of vascular dialysis catheter, initial encounter: Secondary | ICD-10-CM | POA: Diagnosis not present

## 2023-02-03 DIAGNOSIS — Z992 Dependence on renal dialysis: Secondary | ICD-10-CM | POA: Diagnosis not present

## 2023-02-03 DIAGNOSIS — N2581 Secondary hyperparathyroidism of renal origin: Secondary | ICD-10-CM | POA: Diagnosis not present

## 2023-02-03 DIAGNOSIS — N186 End stage renal disease: Secondary | ICD-10-CM | POA: Diagnosis not present

## 2023-02-03 DIAGNOSIS — D689 Coagulation defect, unspecified: Secondary | ICD-10-CM | POA: Diagnosis not present

## 2023-02-04 ENCOUNTER — Telehealth (INDEPENDENT_AMBULATORY_CARE_PROVIDER_SITE_OTHER): Payer: Self-pay | Admitting: Primary Care

## 2023-02-04 NOTE — Telephone Encounter (Signed)
Contacted [name] to schedule their annual wellness visit. Appointment made for 02/15/2023.  Thank you,  Judeth Cornfield,  AMB Clinical Support [facility] AWV Program Direct Dial ??[phone]

## 2023-02-05 DIAGNOSIS — T8249XA Other complication of vascular dialysis catheter, initial encounter: Secondary | ICD-10-CM | POA: Diagnosis not present

## 2023-02-05 DIAGNOSIS — N186 End stage renal disease: Secondary | ICD-10-CM | POA: Diagnosis not present

## 2023-02-05 DIAGNOSIS — D631 Anemia in chronic kidney disease: Secondary | ICD-10-CM | POA: Diagnosis not present

## 2023-02-05 DIAGNOSIS — D689 Coagulation defect, unspecified: Secondary | ICD-10-CM | POA: Diagnosis not present

## 2023-02-05 DIAGNOSIS — N2581 Secondary hyperparathyroidism of renal origin: Secondary | ICD-10-CM | POA: Diagnosis not present

## 2023-02-05 DIAGNOSIS — Z992 Dependence on renal dialysis: Secondary | ICD-10-CM | POA: Diagnosis not present

## 2023-02-08 DIAGNOSIS — N186 End stage renal disease: Secondary | ICD-10-CM | POA: Diagnosis not present

## 2023-02-08 DIAGNOSIS — N2581 Secondary hyperparathyroidism of renal origin: Secondary | ICD-10-CM | POA: Diagnosis not present

## 2023-02-08 DIAGNOSIS — D689 Coagulation defect, unspecified: Secondary | ICD-10-CM | POA: Diagnosis not present

## 2023-02-08 DIAGNOSIS — Z992 Dependence on renal dialysis: Secondary | ICD-10-CM | POA: Diagnosis not present

## 2023-02-08 DIAGNOSIS — D631 Anemia in chronic kidney disease: Secondary | ICD-10-CM | POA: Diagnosis not present

## 2023-02-08 DIAGNOSIS — T8249XA Other complication of vascular dialysis catheter, initial encounter: Secondary | ICD-10-CM | POA: Diagnosis not present

## 2023-02-11 DIAGNOSIS — D631 Anemia in chronic kidney disease: Secondary | ICD-10-CM | POA: Diagnosis not present

## 2023-02-11 DIAGNOSIS — D689 Coagulation defect, unspecified: Secondary | ICD-10-CM | POA: Diagnosis not present

## 2023-02-11 DIAGNOSIS — Z992 Dependence on renal dialysis: Secondary | ICD-10-CM | POA: Diagnosis not present

## 2023-02-11 DIAGNOSIS — N186 End stage renal disease: Secondary | ICD-10-CM | POA: Diagnosis not present

## 2023-02-11 DIAGNOSIS — N2581 Secondary hyperparathyroidism of renal origin: Secondary | ICD-10-CM | POA: Diagnosis not present

## 2023-02-11 DIAGNOSIS — T8249XA Other complication of vascular dialysis catheter, initial encounter: Secondary | ICD-10-CM | POA: Diagnosis not present

## 2023-02-12 DIAGNOSIS — D631 Anemia in chronic kidney disease: Secondary | ICD-10-CM | POA: Diagnosis not present

## 2023-02-12 DIAGNOSIS — Z992 Dependence on renal dialysis: Secondary | ICD-10-CM | POA: Diagnosis not present

## 2023-02-12 DIAGNOSIS — N186 End stage renal disease: Secondary | ICD-10-CM | POA: Diagnosis not present

## 2023-02-12 DIAGNOSIS — D689 Coagulation defect, unspecified: Secondary | ICD-10-CM | POA: Diagnosis not present

## 2023-02-12 DIAGNOSIS — T8249XA Other complication of vascular dialysis catheter, initial encounter: Secondary | ICD-10-CM | POA: Diagnosis not present

## 2023-02-12 DIAGNOSIS — N2581 Secondary hyperparathyroidism of renal origin: Secondary | ICD-10-CM | POA: Diagnosis not present

## 2023-02-15 ENCOUNTER — Ambulatory Visit (INDEPENDENT_AMBULATORY_CARE_PROVIDER_SITE_OTHER): Payer: Medicare HMO

## 2023-02-15 ENCOUNTER — Encounter (INDEPENDENT_AMBULATORY_CARE_PROVIDER_SITE_OTHER): Payer: Self-pay

## 2023-02-15 ENCOUNTER — Telehealth (INDEPENDENT_AMBULATORY_CARE_PROVIDER_SITE_OTHER): Payer: Self-pay

## 2023-02-15 VITALS — BP 140/92 | Ht 67.0 in | Wt 200.0 lb

## 2023-02-15 DIAGNOSIS — E1029 Type 1 diabetes mellitus with other diabetic kidney complication: Secondary | ICD-10-CM

## 2023-02-15 DIAGNOSIS — N186 End stage renal disease: Secondary | ICD-10-CM | POA: Diagnosis not present

## 2023-02-15 DIAGNOSIS — Z0000 Encounter for general adult medical examination without abnormal findings: Secondary | ICD-10-CM | POA: Diagnosis not present

## 2023-02-15 DIAGNOSIS — N2581 Secondary hyperparathyroidism of renal origin: Secondary | ICD-10-CM | POA: Diagnosis not present

## 2023-02-15 DIAGNOSIS — Z135 Encounter for screening for eye and ear disorders: Secondary | ICD-10-CM | POA: Diagnosis not present

## 2023-02-15 DIAGNOSIS — D631 Anemia in chronic kidney disease: Secondary | ICD-10-CM | POA: Diagnosis not present

## 2023-02-15 DIAGNOSIS — Z992 Dependence on renal dialysis: Secondary | ICD-10-CM | POA: Diagnosis not present

## 2023-02-15 DIAGNOSIS — T8249XA Other complication of vascular dialysis catheter, initial encounter: Secondary | ICD-10-CM | POA: Diagnosis not present

## 2023-02-15 DIAGNOSIS — D689 Coagulation defect, unspecified: Secondary | ICD-10-CM | POA: Diagnosis not present

## 2023-02-15 NOTE — Patient Instructions (Signed)
[name] , Thank you for taking time to come for your Medicare Wellness Visit. I appreciate your ongoing commitment to your health goals. Please review the following plan we discussed and let me know if I can assist you in the future.   These are the goals we discussed:  Goals      Patient Stated     Patient states he wants to eat a better diet        This is a list of the screening recommended for you and due dates:  Health Maintenance  Topic Date Due   DTaP/Tdap/Td vaccine (1 - Tdap) Never done   Complete foot exam   05/29/2023*   Eye exam for diabetics  08/18/2023*   COVID-19 Vaccine (1) 09/28/2023*   Flu Shot  04/29/2023   Hemoglobin A1C  05/12/2023   Medicare Annual Wellness Visit  02/15/2024   Hepatitis C Screening: USPSTF Recommendation to screen - Ages 18-79 yo.  Completed   HIV Screening  Completed   HPV Vaccine  Aged Out  *Topic was postponed. The date shown is not the original due date.    Advanced directives: Advance directive discussed with you today. Even though you declined this today, please call our office should you change your mind, and we can give you the proper paperwork for you to fill out. Advance care planning is a way to make decisions about medical care that fits your values in case you are ever unable to make these decisions for yourself.   Conditions/risks identified: Each day, aim for 6 glasses of water, plenty of protein in your diet and try to get up and walk/ stretch every hour for 5-10 minutes at a time.    Next appointment: Follow up in one year for your annual wellness visit 02/28/2024@ 2:00pm via telephone  Preventive Care 40-64 Years, Male Preventive care refers to lifestyle choices and visits with your health care provider that can promote health and wellness. What does preventive care include? A yearly physical exam. This is also called an annual well check. Dental exams once or twice a year. Routine eye exams. Ask your health care  provider how often you should have your eyes checked. Personal lifestyle choices, including: Daily care of your teeth and gums. Regular physical activity. Eating a healthy diet. Avoiding tobacco and drug use. Limiting alcohol use. Practicing safe sex. Taking low-dose aspirin every day starting at age 68. What happens during an annual well check? The services and screenings done by your health care provider during your annual well check will depend on your age, overall health, lifestyle risk factors, and family history of disease. Counseling  Your health care provider may ask you questions about your: Alcohol use. Tobacco use. Drug use. Emotional well-being. Home and relationship well-being. Sexual activity. Eating habits. Work and work Astronomer. Screening  You may have the following tests or measurements: Height, weight, and BMI. Blood pressure. Lipid and cholesterol levels. These may be checked every 5 years, or more frequently if you are over 51 years old. Skin check. Lung cancer screening. You may have this screening every year starting at age 66 if you have a 30-pack-year history of smoking and currently smoke or have quit within the past 15 years. Fecal occult blood test (FOBT) of the stool. You may have this test every year starting at age 26. Flexible sigmoidoscopy or colonoscopy. You may have a sigmoidoscopy every 5 years or a colonoscopy every 10 years starting at age 64. Prostate cancer  screening. Recommendations will vary depending on your family history and other risks. Hepatitis C blood test. Hepatitis B blood test. Sexually transmitted disease (STD) testing. Diabetes screening. This is done by checking your blood sugar (glucose) after you have not eaten for a while (fasting). You may have this done every 1-3 years. Discuss your test results, treatment options, and if necessary, the need for more tests with your health care provider. Vaccines  Your health care  provider may recommend certain vaccines, such as: Influenza vaccine. This is recommended every year. Tetanus, diphtheria, and acellular pertussis (Tdap, Td) vaccine. You may need a Td booster every 10 years. Zoster vaccine. You may need this after age 70. Pneumococcal 13-valent conjugate (PCV13) vaccine. You may need this if you have certain conditions and have not been vaccinated. Pneumococcal polysaccharide (PPSV23) vaccine. You may need one or two doses if you smoke cigarettes or if you have certain conditions. Talk to your health care provider about which screenings and vaccines you need and how often you need them. This information is not intended to replace advice given to you by your health care provider. Make sure you discuss any questions you have with your health care provider. Document Released: 10/11/2015 Document Revised: 06/03/2016 Document Reviewed: 07/16/2015 Elsevier Interactive Patient Education  2017 [employer].  Fall Prevention in the Home Falls can cause injuries. They can happen to people of all ages. There are many things you can do to make your home safe and to help prevent falls. What can I do on the outside of my home? Regularly fix the edges of walkways and driveways and fix any cracks. Remove anything that might make you trip as you walk through a door, such as a raised step or threshold. Trim any bushes or trees on the path to your home. Use bright outdoor lighting. Clear any walking paths of anything that might make someone trip, such as rocks or tools. Regularly check to see if handrails are loose or broken. Make sure that both sides of any steps have handrails. Any raised decks and porches should have guardrails on the edges. Have any leaves, snow, or ice cleared regularly. Use sand or salt on walking paths during winter. Clean up any spills in your garage right away. This includes oil or grease spills. What can I do in the bathroom? Use night  lights. Install grab bars by the toilet and in the tub and shower. Do not use towel bars as grab bars. Use non-skid mats or decals in the tub or shower. If you need to sit down in the shower, use a plastic, non-slip stool. Keep the floor dry. Clean up any water that spills on the floor as soon as it happens. Remove soap buildup in the tub or shower regularly. Attach bath mats securely with double-sided non-slip rug tape. Do not have throw rugs and other things on the floor that can make you trip. What can I do in the bedroom? Use night lights. Make sure that you have a light by your bed that is easy to reach. Do not use any sheets or blankets that are too big for your bed. They should not hang down onto the floor. Have a firm chair that has side arms. You can use this for support while you get dressed. Do not have throw rugs and other things on the floor that can make you trip. What can I do in the kitchen? Clean up any spills right away. Avoid walking on wet floors.  Keep items that you use a lot in easy-to-reach places. If you need to reach something above you, use a strong step stool that has a grab bar. Keep electrical cords out of the way. Do not use floor polish or wax that makes floors slippery. If you must use wax, use non-skid floor wax. Do not have throw rugs and other things on the floor that can make you trip. What can I do with my stairs? Do not leave any items on the stairs. Make sure that there are handrails on both sides of the stairs and use them. Fix handrails that are broken or loose. Make sure that handrails are as long as the stairways. Check any carpeting to make sure that it is firmly attached to the stairs. Fix any carpet that is loose or worn. Avoid having throw rugs at the top or bottom of the stairs. If you do have throw rugs, attach them to the floor with carpet tape. Make sure that you have a light switch at the top of the stairs and the bottom of the stairs. If  you do not have them, ask someone to add them for you. What else can I do to help prevent falls? Wear shoes that: Do not have high heels. Have rubber bottoms. Are comfortable and fit you well. Are closed at the toe. Do not wear sandals. If you use a stepladder: Make sure that it is fully opened. Do not climb a closed stepladder. Make sure that both sides of the stepladder are locked into place. Ask someone to hold it for you, if possible. Clearly mark and make sure that you can see: Any grab bars or handrails. First and last steps. Where the edge of each step is. Use tools that help you move around (mobility aids) if they are needed. These include: Canes. Walkers. Scooters. Crutches. Turn on the lights when you go into a dark area. Replace any light bulbs as soon as they burn out. Set up your furniture so you have a clear path. Avoid moving your furniture around. If any of your floors are uneven, fix them. If there are any pets around you, be aware of where they are. Review your medicines with your doctor. Some medicines can make you feel dizzy. This can increase your chance of falling. Ask your doctor what other things that you can do to help prevent falls. This information is not intended to replace advice given to you by your health care provider. Make sure you discuss any questions you have with your health care provider. Document Released: 07/11/2009 Document Revised: 02/20/2016 Document Reviewed: 10/19/2014 Elsevier Interactive Patient Education  2017 [employer].

## 2023-02-15 NOTE — Progress Notes (Signed)
 I connected with  [name] on 02/15/23 by a audio enabled telemedicine application and verified that I am speaking with the correct person using two identifiers.  Patient Location: Home  Provider Location: Home Office  I discussed the limitations of evaluation and management by telemedicine. The patient expressed understanding and agreed to proceed. Subjective:   [name] is a 42 y.o. male who presents for Medicare Annual/Subsequent preventive examination.  Review of Systems     Cardiac Risk Factors include: diabetes mellitus;dyslipidemia;hypertension;male gender;obesity (BMI >30kg/m2);sedentary lifestyle;Other (see comment), Risk factor comments: ESRD     Objective:    Today's Vitals   02/15/23 1434  BP: (!) 140/92  Weight: 200 lb (90.7 kg)  Height: 5\' 7"  (1.702 m)  PainSc: 3    Body mass index is 31.32 kg/m.     02/15/2023    2:45 PM 11/20/2021    8:53 AM 05/20/2021    1:00 AM 05/19/2021   12:50 PM 12/14/2020    2:11 AM 10/29/2020    3:32 AM 10/28/2020   11:51 AM  Advanced Directives  Does Patient Have a Medical Advance Directive? No No No No No No No  Would patient like information on creating a medical advance directive? No - Patient declined No - Patient declined No - Patient declined No - Patient declined  Yes (ED - Information included in AVS) No - Patient declined    Current Medications (verified) Outpatient Encounter Medications as of 02/15/2023  Medication Sig   calcitRIOL (ROCALTROL) 0.5 MCG capsule Take 3 capsules (1.5 mcg total) by mouth every Monday, Wednesday, and Friday with hemodialysis.   cinacalcet (SENSIPAR) 60 MG tablet Take 120 mg by mouth every evening.   gabapentin (NEURONTIN) 100 MG capsule Take 1 capsule (100 mg total) by mouth 3 (three) times daily.   insulin NPH-regular Human (NOVOLIN 70/30) (70-30) 100 UNIT/ML injection Inject 10 Units into the skin 2 (two) times daily with a meal.   insulin regular (NOVOLIN R RELION) 100 units/mL  injection INJECT 12 UNITS SUBCUTANEOUSLY THREE TIMES DAILY BEFORE MEAL(S)   multivitamin (RENA-VIT) TABS tablet Take 1 tablet by mouth at bedtime.   sevelamer carbonate (RENVELA) 800 MG tablet Take 2,400-4,000 mg by mouth See admin instructions. Take 4,000 mg (5 tablets) by mouth with meals and 2,400 mg (3 tablets) with snacks   No facility-administered encounter medications on file as of 02/15/2023.    Allergies (verified) Patient has no known allergies.   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 Filetted toe flap metatarsal resection. Debridement Plantar Foot wound;  Surgeon: Park Liter, DPM;  Location: [facility] OR;  Service: Podiatry;  Laterality: Right;   AMPUTATION Left 08/20/2018   Procedure: FIFTH METATARSAL BONE BIOPSY;  Surgeon: Park Liter, DPM;  [facility]: [facility] OR;  Service: Podiatry;  Laterality: Left;   AMPUTATION Left 10/28/2018   Procedure: LEFT GREAT TOE AMPUTATION;  Surgeon: Park Liter, DPM;  [facility] OR;  Service: Podiatry;  Laterality: Left;   AMPUTATION Left 09/16/2020   Procedure: AMPUTATION BELOW KNEE;  Surgeon: Larina Earthly, MD;  Location: [facility] OR;  Service: Vascular;  Laterality: Left;   AMPUTATION Left 10/15/2020   Procedure: AMPUTATION ABOVE KNEE;  Surgeon: Sherren Kerns, MD;  Location: [facility] OR;  Service: Vascular;  Laterality: Left;   APPLICATION OF WOUND VAC  02/02/2018   Procedure: APPLICATION OF WOUND VAC  Right Foot;  Surgeon: Park Liter, DPM;  [facility] OR;  Service: Podiatry;;   APPLICATION OF WOUND VAC Left 10/28/2018   Procedure: APPLICATION OF WOUND VAC LEFT TOE;  Surgeon: Park Liter, DPM;  [facility] OR;  Service: Podiatry;  Laterality: Left;   APPLICATION OF WOUND VAC Left 11/01/2018   Procedure: APPLICATION OF WOUND VAC;  Surgeon: Park Liter, DPM;  [facility] OR;  Service: Podiatry;  Laterality: Left;   AV FISTULA PLACEMENT     left arm.   AV FISTULA PLACEMENT Right 12/22/2016   Procedure: INSERTION OF ARTERIOVENOUS (AV) GORE-TEX GRAFT ARM;  Surgeon: Sherren Kerns, MD;  Location: [facility] OR;  Service: Vascular;  Laterality: Right;   AV FISTULA PLACEMENT Left 05/26/2018   Procedure: INSERTION OF  ARTERIOVENOUS (AV) GORE-TEX GRAFT LEFT ARM;  Surgeon: Nada Libman, MD;  [facility];  Service: Vascular;  Laterality: Left;   EYE SURGERY     I & D EXTREMITY Right 10/31/2017   Procedure: IRRIGATION AND DEBRIDEMENT RIGHT FOOT;  Surgeon: Park Liter, DPM;  [facility];  Service: Podiatry;  Laterality: Right;   I & D EXTREMITY Left 08/20/2018   Procedure: IRRIGATION AND DEBRIDEMENT EXTREMITY WITH SECONDARY WOUND CLOSUREAND APPLICATION OF WOUND VAC LEFT FOOT;  Surgeon: Park Liter, DPM;  [facility];  Service: Podiatry;  Laterality: Left;   I & D EXTREMITY Left 10/20/2018   Procedure: IRRIGATION AND DEBRIDEMENT LEFT FOOT  DEBRIDEMENT LATERAL FOOT WOUND;  Surgeon: Park Liter, DPM;  [facility];  Service: Podiatry;  Laterality: Left;   I & D EXTREMITY Left 10/28/2018   Procedure: IRRIGATION AND DEBRIDEMENT LEFT TOE;  Surgeon: Park Liter, DPM;  [facility];  Service: Podiatry;  Laterality: Left;   I & D EXTREMITY Left 09/14/2020   Procedure: IRRIGATION AND DEBRIDEMENT WRIST;  Surgeon: Betha Loa, MD;  [facility];  Service: Orthopedics;  Laterality: Left;   INSERTION OF DIALYSIS CATHETER     Right subclavian   IR AV DIALY SHUNT INTRO NEEDLE/INTRACATH INITIAL W/PTA/IMG RIGHT Right 02/05/2018    IR FLUORO GUIDE CV LINE RIGHT  01/31/2020   IR FLUORO GUIDE CV LINE RIGHT  01/30/2022   IR PTA VENOUS EXCEPT DIALYSIS CIRCUIT  01/30/2022   IR THROMBECTOMY AV FISTULA W/THROMBOLYSIS/PTA INC/SHUNT/IMG LEFT Left 08/24/2018   IR THROMBECTOMY AV FISTULA W/THROMBOLYSIS/PTA INC/SHUNT/IMG LEFT Left 01/06/2019   IR US GUIDE VASC ACCESS LEFT  08/24/2018   IR US GUIDE VASC ACCESS RIGHT  02/05/2018   IR US GUIDE VASC ACCESS RIGHT  01/31/2020   IR VENOCAVAGRAM SVC  01/30/2022   IRRIGATION AND DEBRIDEMENT FOOT Right 10/23/2018   Procedure: Irrigation and Debridement to tendon, Left Foot;  Surgeon: Park Liter, DPM;  [facility];  Service: Podiatry;  Laterality: Right;   IRRIGATION AND DEBRIDEMENT FOOT Left 11/01/2018   Procedure: IRRIGATION AND DEBRIDEMENT PARTIAL WOUND CLOSURE LOCAL TISSUE TRANSFER AND FLAP ROTATION, LEFT FOOT;  Surgeon: Park Liter, DPM;  [facility];  Service: Podiatry;  Laterality: Left;   TRANSMETATARSAL AMPUTATION N/A 08/18/2018   Procedure: IRRIGATION AND DEBRIDEMENT OF LEFT 5TH TOE AND TRANSMETATARSAL, WITH PARTICAL LEFT 5TH TOE AND METATARSAL AMPUTATION, BONE BIOPSY, WOUND VAC APPLICATION.;  Surgeon: Park Liter, DPM;  [facility];  Service: Podiatry;  Laterality: N/A;   UPPER EXTREMITY VENOGRAPHY N/A 11/16/2016   Procedure: Upper Extremity Venography - Right Central;  Surgeon: Sherren Kerns, MD;  Location: [facility] INVASIVE CV LAB;  Service: Cardiovascular;  Laterality: N/A;   UPPER EXTREMITY VENOGRAPHY N/A 05/25/2018   Procedure: UPPER EXTREMITY VENOGRAPHY - Bilateral;  Surgeon: Cephus Shelling, MD;  [facility] INVAS[facility] CV [facility];  Service: Cardiovascular;  Laterality: N/A;   Family History  Problem Relation Age of Onset   Diabetes Mellitus II Other    Diabetes Father    Renal Disease Father        ESRD   Social History   Socioeconomic History   Marital status: Single    Spouse name: Not on file   Number of children: Not on file   Years of education: Not  on file   Highest education level: Not on file  Occupational History   Not on file  Tobacco Use   Smoking status: Never   Smokeless tobacco: Never  Vaping Use   Vaping Use: Never used  Substance and Sexual Activity   Alcohol use: No   Drug use: No   Sexual activity: Never  Other Topics Concern   Not on file  Social History Narrative   Not on file   Social Determinants of Health   Financial Resource Strain: Low Risk  (02/15/2023)   Overall Financial Resource Strain (CARDIA)    Difficulty of Paying Living Expenses: Not hard at all  Food Insecurity: No Food Insecurity (02/15/2023)   Hunger Vital Sign    Worried About Running Out of Food in the Last Year: Never true    Ran Out of Food in the Last Year: Never true  Transportation Needs: No Transportation Needs (02/15/2023)   PRAPARE - Administrator, Civil Service (Medical): No    Lack of Transportation (Non-Medical): No  Physical Activity: Inactive (02/15/2023)   Exercise Vital Sign    Days of Exercise per Week: 0 days    Minutes of Exercise per Session: 0 min  Stress: No Stress Concern Present (02/15/2023)   [employer] of Occupational Health - Occupational Stress Questionnaire    Feeling of Stress : Not at all  Social Connections: Socially Integrated (02/15/2023)   Social Connection and Isolation Panel [NHANES]    Frequency of Communication with Friends and Family: More than three times a week    Frequency of Social Gatherings with Friends and Family: More than three times a week    Attends Religious Services: More than 4 times per year    Active Member of [employer] or Organizations: Yes    Attends Engineer, structural: More than 4 times per year    Marital Status: Married    Tobacco Counseling Counseling given: Yes   Clinical Intake:  Pre-visit preparation completed: Yes  Pain : 0-10 Pain Score: 3  Pain Type: Chronic pain Pain Orientation: Left ("comes and goes. phantom pains") Pain Onset:  More than a month ago Pain Frequency: Intermittent     BMI - recorded: 31.32 Nutritional Status: BMI > 30  Obese Nutritional Risks: None Diabetes: Yes CBG done?: No (telephone visit/ patient states his last CBG was ~200) Did pt. bring  in CBG monitor from home?: No  How often do you need to have someone help you when you read instructions, pamphlets, or other written materials from your doctor or pharmacy?: 1 - Never  Diabetic?yes  Nutrition Risk Assessment:  Has the patient had any N/V/D within the last 2 months?  No  Does the patient have any non-healing wounds?  No  Has the patient had any unintentional weight loss or weight gain?  No   Diabetes:  Is the patient diabetic?  Yes  If diabetic, was a CBG obtained today?  No  telephone visit. Patient stated his cbg was ~200 this morning Did the patient bring in their glucometer from home?  No  How often do you monitor your CBG's? daily.   Financial Strains and Diabetes Management:  Are you having any financial strains with the device, your supplies or your medication? No .  Does the patient want to be seen by Chronic Care Management for management of their diabetes?  No  Would the patient like to be referred to a Nutritionist or for Diabetic Management?  No   Diabetic Exams:  Diabetic Eye Exam: Overdue for diabetic eye exam. Pt has been advised about the importance in completing this exam. Patient advised to call and schedule an eye exam. Diabetic Foot Exam: Overdue, Pt has been advised about the importance in completing this exam. Pt is scheduled for diabetic foot exam on  .   Interpreter Needed?: No  Information entered by ::  Julionna Marczak, CMA   Activities of Daily Living    02/15/2023    2:42 PM  In your present state of health, do you have any difficulty performing the following activities:  Hearing? 0  Vision? 1  Comment has had eye surgery in the past  Difficulty concentrating or making decisions? 0  Walking  or climbing stairs? 1  Comment left above the knee amputation  Dressing or bathing? 1  Comment due to amputation  Doing errands, shopping? 1  Comment left above the knee amputation  Preparing Food and eating ? Y  Using the Toilet? N  In the past six months, have you accidently leaked urine? N  Do you have problems with loss of bowel control? N  Managing your Medications? N  Managing your Finances? N  Housekeeping or managing your Housekeeping? Y    Patient Care Team: [name], NP as PCP - General (Internal Medicine) [name], Fayrene Fearing, MD as Consulting Physician (Nephrology) [facility] as Referring Physician (Nephrology) Leonie Douglas, MD as Consulting Physician (Vascular Surgery)  Indicate any recent Medical Services you may have received from other than [facility] providers in the past year (date may be approximate).     Assessment:   This is a routine wellness examination for Boe.  Hearing/Vision screen Hearing Screening - Comments:: Patient denies any hearing difficulties.   Vision Screening - Comments:: Patient complains of difficulty with vision due to his diabetes. He is due for an eye exam but does not remember his eye doctors name.   Dietary issues and exercise activities discussed: Current Exercise Habits: The patient does not participate in regular exercise at present, Exercise limited by: orthopedic condition(s)   Goals Addressed             This Visit's Progress    Patient Stated       Patient states he wants to eat a better diet       Depression Screen    02/15/2023  2:40 PM 06/25/2022    1:47 PM 06/07/2021   12:40 PM 02/13/2021    2:41 PM 02/13/2020   11:09 AM 05/25/2019    2:24 PM  PHQ 2/9 Scores  PHQ - 2 Score 0 0 0 0 0 0  PHQ- 9 Score    0      Fall Risk    02/15/2023    2:38 PM 06/07/2021   12:40 PM 02/13/2020   11:09 AM 05/25/2019    2:23 PM  Fall Risk   Falls in the past year? 0 0 1 0  Number falls in past yr: 0  0 0   Injury with Fall? 0 0 0   Risk for fall due to : History of fall(s);Impaired balance/gait;Impaired mobility No Fall Risks Impaired balance/gait;Impaired mobility   Follow up Falls prevention discussed;Education provided Falls evaluation completed      FALL RISK PREVENTION PERTAINING TO THE HOME:  Any stairs in or around the home? No  If so, are there any without handrails? No  Home free of loose throw rugs in walkways, pet beds, electrical cords, etc? Yes  Adequate lighting in your home to reduce risk of falls? Yes   ASSISTIVE DEVICES UTILIZED TO PREVENT FALLS:  Life alert? No  Use of a cane, walker or w/c? Yes  Grab bars in the bathroom? Yes  Shower chair or bench in shower? No  Elevated toilet seat or a handicapped toilet? No   TIMED UP AND GO:  Was the test performed? No . Telephone visit  Cognitive Function:        02/15/2023    2:45 PM  6CIT Screen  What Year? 0 points  What month? 0 points  What time? 0 points  Count back from 20 0 points  Months in reverse 0 points  Repeat phrase 0 points  Total Score 0 points    Immunizations Immunization History  Administered Date(s) Administered   Hepatitis B, ADULT 06/30/2012, 09/19/2012, 10/20/2012, 11/21/2012, 12/15/2012   Influenza,inj,quad, With Preservative 07/01/2018    TDAP status: Due, Education has been provided regarding the importance of this vaccine. Advised may receive this vaccine at local pharmacy or Health Dept. Aware to provide a copy of the vaccination record if obtained from local pharmacy or Health Dept. Verbalized acceptance and understanding.  Flu Vaccine status: Up to date  Pneumococcal vaccine status: Due, Education has been provided regarding the importance of this vaccine. Advised may receive this vaccine at local pharmacy or Health Dept. Aware to provide a copy of the vaccination record if obtained from local pharmacy or Health Dept. Verbalized acceptance and understanding.  Covid-19  vaccine status: Information provided on how to obtain vaccines.   Qualifies for Shingles Vaccine? No   Zostavax completed No   Shingrix Completed?: No.    Education has been provided regarding the importance of this vaccine. Patient has been advised to call insurance company to determine out of pocket expense if they have not yet received this vaccine. Advised may also receive vaccine at local pharmacy or Health Dept. Verbalized acceptance and understanding.  Screening Tests Health Maintenance  Topic Date Due   COVID-19 Vaccine (1) Never done   FOOT EXAM  Never done   Hepatitis C Screening  Never done   DTaP/Tdap/Td (1 - Tdap) Never done   OPHTHALMOLOGY EXAM  10/31/2022   INFLUENZA VACCINE  04/29/2023   HEMOGLOBIN A1C  05/12/2023   Medicare Annual Wellness (AWV)  02/15/2024   HIV Screening  Completed  HPV VACCINES  Aged Out    Health Maintenance  Health Maintenance Due  Topic Date Due   COVID-19 Vaccine (1) Never done   FOOT EXAM  Never done   Hepatitis C Screening  Never done   DTaP/Tdap/Td (1 - Tdap) Never done   OPHTHALMOLOGY EXAM  10/31/2022    Colorectal Cancer Screenings: Patient does not qualify for this screening due to age.   Lung Cancer Screening: (Low Dose CT Chest recommended if Age 65-80 years, 30 pack-year currently smoking OR have quit w/in 15years.) does not qualify.    Additional Screening:  Hepatitis C Screening: does qualify; Completed on 08/09/2014 Negative  Vision Screening: Recommended annual ophthalmology exams for early detection of glaucoma and other disorders of the eye. Is the patient up to date with their annual eye exam?  No  Who is the provider or what is the name of the office in which the patient attends annual eye exams? Patient doesn't recall name of eye doctor If pt is not established with a provider, would they like to be referred to a provider to establish care? Yes .   Dental Screening: Recommended annual dental exams for proper  oral hygiene  Community Resource Referral / Chronic Care Management: CRR required this visit?  No   CCM required this visit?  No      Plan:     I have personally reviewed and noted the following in the patient's chart:   Medical and social history Use of alcohol, tobacco or illicit drugs  Current medications and supplements including opioid prescriptions. Patient is not currently taking opioid prescriptions. Functional ability and status Nutritional status Physical activity Advanced directives List of other physicians Hospitalizations, surgeries, and ER visits in previous 12 months Vitals Screenings to include cognitive, depression, and falls Referrals and appointments  In addition, I have reviewed and discussed with patient certain preventive protocols, quality metrics, and best practice recommendations. A written personalized care plan for preventive services as well as general preventive health recommendations were provided to patient.   Due to this being a telephonic visit, the after visit summary with patients personalized plan was offered to patient via mail or my-chart. Patient would like to access their AVS via my-chart    Jordan Hawks Jin Capote, CMA   02/15/2023   Nurse Notes: Patient is due for a diabetic foot exam.

## 2023-02-15 NOTE — Telephone Encounter (Signed)
Please schedule patient a follow up appt with a diabetic foot exam to be done at this visit.

## 2023-02-16 ENCOUNTER — Other Ambulatory Visit (INDEPENDENT_AMBULATORY_CARE_PROVIDER_SITE_OTHER): Payer: Self-pay | Admitting: Primary Care

## 2023-02-17 ENCOUNTER — Other Ambulatory Visit (INDEPENDENT_AMBULATORY_CARE_PROVIDER_SITE_OTHER): Payer: Self-pay

## 2023-02-17 DIAGNOSIS — N186 End stage renal disease: Secondary | ICD-10-CM | POA: Diagnosis not present

## 2023-02-17 DIAGNOSIS — Z992 Dependence on renal dialysis: Secondary | ICD-10-CM | POA: Diagnosis not present

## 2023-02-17 DIAGNOSIS — T8249XA Other complication of vascular dialysis catheter, initial encounter: Secondary | ICD-10-CM | POA: Diagnosis not present

## 2023-02-17 DIAGNOSIS — D631 Anemia in chronic kidney disease: Secondary | ICD-10-CM | POA: Diagnosis not present

## 2023-02-17 DIAGNOSIS — D689 Coagulation defect, unspecified: Secondary | ICD-10-CM | POA: Diagnosis not present

## 2023-02-17 DIAGNOSIS — N2581 Secondary hyperparathyroidism of renal origin: Secondary | ICD-10-CM | POA: Diagnosis not present

## 2023-02-17 MED ORDER — GABAPENTIN 100 MG PO CAPS: 100.0000 mg | ORAL_CAPSULE | Freq: Three times a day (TID) | ORAL | 1 refills | Status: DC

## 2023-02-17 NOTE — Telephone Encounter (Signed)
Requested Prescriptions  Pending Prescriptions Disp Refills   gabapentin (NEURONTIN) 100 MG capsule [Pharmacy Med Name: Gabapentin 100 MG Oral Capsule] 90 capsule 0    Sig: TAKE 1 CAPSULE BY MOUTH THREE TIMES DAILY     Neurology: Anticonvulsants - gabapentin Failed - 02/16/2023  6:13 PM      Failed - Cr in normal range and within 360 days    Creatinine  Date Value Ref Range Status  08/22/2014 5.00 (H) 0.60 - 1.30 mg/dL Final   Creatinine, Ser  Date Value Ref Range Status  04/17/2022 9.06 (H) 0.61 - 1.24 mg/dL Final         Passed - Completed PHQ-2 or PHQ-9 in the last 360 days      Passed - Valid encounter within last 12 months    Recent Outpatient Visits           2 months ago 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)   [place] [facility] [name], NP   7 months ago Type 1 diabetes mellitus with complication, on long-term current use of insulin ([name])   [place] [facility] [name], NP   1 year ago Hx of above knee amputation, left [facility])   [place] [facility] [name], NP   2 years ago Type 1 diabetes mellitus with other circulatory complication [facility])   [place] [facility] [name], NP   3 years ago Type 1 diabetes mellitus with other circulatory complication [facility])   [place] [facility] [name], NP

## 2023-02-19 DIAGNOSIS — Z992 Dependence on renal dialysis: Secondary | ICD-10-CM | POA: Diagnosis not present

## 2023-02-19 DIAGNOSIS — D689 Coagulation defect, unspecified: Secondary | ICD-10-CM | POA: Diagnosis not present

## 2023-02-19 DIAGNOSIS — T8249XA Other complication of vascular dialysis catheter, initial encounter: Secondary | ICD-10-CM | POA: Diagnosis not present

## 2023-02-19 DIAGNOSIS — D631 Anemia in chronic kidney disease: Secondary | ICD-10-CM | POA: Diagnosis not present

## 2023-02-19 DIAGNOSIS — N2581 Secondary hyperparathyroidism of renal origin: Secondary | ICD-10-CM | POA: Diagnosis not present

## 2023-02-19 DIAGNOSIS — N186 End stage renal disease: Secondary | ICD-10-CM | POA: Diagnosis not present

## 2023-02-24 DIAGNOSIS — D631 Anemia in chronic kidney disease: Secondary | ICD-10-CM | POA: Diagnosis not present

## 2023-02-24 DIAGNOSIS — N186 End stage renal disease: Secondary | ICD-10-CM | POA: Diagnosis not present

## 2023-02-24 DIAGNOSIS — N2581 Secondary hyperparathyroidism of renal origin: Secondary | ICD-10-CM | POA: Diagnosis not present

## 2023-02-24 DIAGNOSIS — T8249XA Other complication of vascular dialysis catheter, initial encounter: Secondary | ICD-10-CM | POA: Diagnosis not present

## 2023-02-24 DIAGNOSIS — Z992 Dependence on renal dialysis: Secondary | ICD-10-CM | POA: Diagnosis not present

## 2023-02-24 DIAGNOSIS — D689 Coagulation defect, unspecified: Secondary | ICD-10-CM | POA: Diagnosis not present

## 2023-02-26 DIAGNOSIS — D689 Coagulation defect, unspecified: Secondary | ICD-10-CM | POA: Diagnosis not present

## 2023-02-26 DIAGNOSIS — N186 End stage renal disease: Secondary | ICD-10-CM | POA: Diagnosis not present

## 2023-02-26 DIAGNOSIS — Z992 Dependence on renal dialysis: Secondary | ICD-10-CM | POA: Diagnosis not present

## 2023-02-26 DIAGNOSIS — D631 Anemia in chronic kidney disease: Secondary | ICD-10-CM | POA: Diagnosis not present

## 2023-02-26 DIAGNOSIS — N2581 Secondary hyperparathyroidism of renal origin: Secondary | ICD-10-CM | POA: Diagnosis not present

## 2023-02-26 DIAGNOSIS — I129 Hypertensive chronic kidney disease with stage 1 through stage 4 chronic kidney disease, or unspecified chronic kidney disease: Secondary | ICD-10-CM | POA: Diagnosis not present

## 2023-02-26 DIAGNOSIS — T8249XA Other complication of vascular dialysis catheter, initial encounter: Secondary | ICD-10-CM | POA: Diagnosis not present

## 2023-03-01 DIAGNOSIS — N2581 Secondary hyperparathyroidism of renal origin: Secondary | ICD-10-CM | POA: Diagnosis not present

## 2023-03-01 DIAGNOSIS — T8249XA Other complication of vascular dialysis catheter, initial encounter: Secondary | ICD-10-CM | POA: Diagnosis not present

## 2023-03-01 DIAGNOSIS — Z992 Dependence on renal dialysis: Secondary | ICD-10-CM | POA: Diagnosis not present

## 2023-03-01 DIAGNOSIS — N186 End stage renal disease: Secondary | ICD-10-CM | POA: Diagnosis not present

## 2023-03-01 DIAGNOSIS — D689 Coagulation defect, unspecified: Secondary | ICD-10-CM | POA: Diagnosis not present

## 2023-03-01 DIAGNOSIS — I159 Secondary hypertension, unspecified: Secondary | ICD-10-CM | POA: Diagnosis not present

## 2023-03-05 DIAGNOSIS — Z992 Dependence on renal dialysis: Secondary | ICD-10-CM | POA: Diagnosis not present

## 2023-03-05 DIAGNOSIS — I159 Secondary hypertension, unspecified: Secondary | ICD-10-CM | POA: Diagnosis not present

## 2023-03-05 DIAGNOSIS — N186 End stage renal disease: Secondary | ICD-10-CM | POA: Diagnosis not present

## 2023-03-05 DIAGNOSIS — N2581 Secondary hyperparathyroidism of renal origin: Secondary | ICD-10-CM | POA: Diagnosis not present

## 2023-03-05 DIAGNOSIS — D689 Coagulation defect, unspecified: Secondary | ICD-10-CM | POA: Diagnosis not present

## 2023-03-05 DIAGNOSIS — T8249XA Other complication of vascular dialysis catheter, initial encounter: Secondary | ICD-10-CM | POA: Diagnosis not present

## 2023-03-06 DIAGNOSIS — N2581 Secondary hyperparathyroidism of renal origin: Secondary | ICD-10-CM | POA: Diagnosis not present

## 2023-03-06 DIAGNOSIS — T8249XA Other complication of vascular dialysis catheter, initial encounter: Secondary | ICD-10-CM | POA: Diagnosis not present

## 2023-03-06 DIAGNOSIS — I159 Secondary hypertension, unspecified: Secondary | ICD-10-CM | POA: Diagnosis not present

## 2023-03-06 DIAGNOSIS — Z992 Dependence on renal dialysis: Secondary | ICD-10-CM | POA: Diagnosis not present

## 2023-03-06 DIAGNOSIS — D689 Coagulation defect, unspecified: Secondary | ICD-10-CM | POA: Diagnosis not present

## 2023-03-06 DIAGNOSIS — N186 End stage renal disease: Secondary | ICD-10-CM | POA: Diagnosis not present

## 2023-03-08 DIAGNOSIS — D689 Coagulation defect, unspecified: Secondary | ICD-10-CM | POA: Diagnosis not present

## 2023-03-08 DIAGNOSIS — T8249XA Other complication of vascular dialysis catheter, initial encounter: Secondary | ICD-10-CM | POA: Diagnosis not present

## 2023-03-08 DIAGNOSIS — N186 End stage renal disease: Secondary | ICD-10-CM | POA: Diagnosis not present

## 2023-03-08 DIAGNOSIS — I159 Secondary hypertension, unspecified: Secondary | ICD-10-CM | POA: Diagnosis not present

## 2023-03-08 DIAGNOSIS — Z992 Dependence on renal dialysis: Secondary | ICD-10-CM | POA: Diagnosis not present

## 2023-03-08 DIAGNOSIS — N2581 Secondary hyperparathyroidism of renal origin: Secondary | ICD-10-CM | POA: Diagnosis not present

## 2023-03-12 DIAGNOSIS — D689 Coagulation defect, unspecified: Secondary | ICD-10-CM | POA: Diagnosis not present

## 2023-03-12 DIAGNOSIS — I159 Secondary hypertension, unspecified: Secondary | ICD-10-CM | POA: Diagnosis not present

## 2023-03-12 DIAGNOSIS — N186 End stage renal disease: Secondary | ICD-10-CM | POA: Diagnosis not present

## 2023-03-12 DIAGNOSIS — Z992 Dependence on renal dialysis: Secondary | ICD-10-CM | POA: Diagnosis not present

## 2023-03-12 DIAGNOSIS — T8249XA Other complication of vascular dialysis catheter, initial encounter: Secondary | ICD-10-CM | POA: Diagnosis not present

## 2023-03-12 DIAGNOSIS — N2581 Secondary hyperparathyroidism of renal origin: Secondary | ICD-10-CM | POA: Diagnosis not present

## 2023-03-15 DIAGNOSIS — N186 End stage renal disease: Secondary | ICD-10-CM | POA: Diagnosis not present

## 2023-03-15 DIAGNOSIS — Z992 Dependence on renal dialysis: Secondary | ICD-10-CM | POA: Diagnosis not present

## 2023-03-15 DIAGNOSIS — I159 Secondary hypertension, unspecified: Secondary | ICD-10-CM | POA: Diagnosis not present

## 2023-03-15 DIAGNOSIS — N2581 Secondary hyperparathyroidism of renal origin: Secondary | ICD-10-CM | POA: Diagnosis not present

## 2023-03-15 DIAGNOSIS — T8249XA Other complication of vascular dialysis catheter, initial encounter: Secondary | ICD-10-CM | POA: Diagnosis not present

## 2023-03-15 DIAGNOSIS — D689 Coagulation defect, unspecified: Secondary | ICD-10-CM | POA: Diagnosis not present

## 2023-03-19 DIAGNOSIS — Z992 Dependence on renal dialysis: Secondary | ICD-10-CM | POA: Diagnosis not present

## 2023-03-19 DIAGNOSIS — N186 End stage renal disease: Secondary | ICD-10-CM | POA: Diagnosis not present

## 2023-03-19 DIAGNOSIS — T8249XA Other complication of vascular dialysis catheter, initial encounter: Secondary | ICD-10-CM | POA: Diagnosis not present

## 2023-03-19 DIAGNOSIS — D689 Coagulation defect, unspecified: Secondary | ICD-10-CM | POA: Diagnosis not present

## 2023-03-19 DIAGNOSIS — I159 Secondary hypertension, unspecified: Secondary | ICD-10-CM | POA: Diagnosis not present

## 2023-03-19 DIAGNOSIS — N2581 Secondary hyperparathyroidism of renal origin: Secondary | ICD-10-CM | POA: Diagnosis not present

## 2023-03-22 ENCOUNTER — Other Ambulatory Visit (INDEPENDENT_AMBULATORY_CARE_PROVIDER_SITE_OTHER): Payer: Self-pay | Admitting: Primary Care

## 2023-03-22 ENCOUNTER — Telehealth (INDEPENDENT_AMBULATORY_CARE_PROVIDER_SITE_OTHER): Payer: Self-pay | Admitting: Primary Care

## 2023-03-22 DIAGNOSIS — N186 End stage renal disease: Secondary | ICD-10-CM

## 2023-03-22 NOTE — Telephone Encounter (Signed)
Medication Refill - Medication: Requesting Test Strips - do not see in the chart.  Has the patient contacted their pharmacy? Yes.   (Agent: If no, request that the patient contact the pharmacy for the refill. If patient does not wish to contact the pharmacy document the reason why and proceed with request.) (Agent: If yes, when and what did the pharmacy advise?) [facility] - [place], [name] - [address] Phone: [phone]  Fax: [phone]      Preferred Pharmacy (with phone number or street name):  Has the patient been seen for an appointment in the last year OR does the patient have an upcoming appointment? Yes.    Agent: Please be advised that RX refills may take up to 3 business days. We ask that you follow-up with your pharmacy.

## 2023-03-22 NOTE — Telephone Encounter (Signed)
Test strips was already refilled today by provider in a separate encounter.

## 2023-03-24 DIAGNOSIS — N186 End stage renal disease: Secondary | ICD-10-CM | POA: Diagnosis not present

## 2023-03-24 DIAGNOSIS — D689 Coagulation defect, unspecified: Secondary | ICD-10-CM | POA: Diagnosis not present

## 2023-03-24 DIAGNOSIS — I159 Secondary hypertension, unspecified: Secondary | ICD-10-CM | POA: Diagnosis not present

## 2023-03-24 DIAGNOSIS — Z992 Dependence on renal dialysis: Secondary | ICD-10-CM | POA: Diagnosis not present

## 2023-03-24 DIAGNOSIS — N2581 Secondary hyperparathyroidism of renal origin: Secondary | ICD-10-CM | POA: Diagnosis not present

## 2023-03-24 DIAGNOSIS — T8249XA Other complication of vascular dialysis catheter, initial encounter: Secondary | ICD-10-CM | POA: Diagnosis not present

## 2023-03-28 DIAGNOSIS — I129 Hypertensive chronic kidney disease with stage 1 through stage 4 chronic kidney disease, or unspecified chronic kidney disease: Secondary | ICD-10-CM | POA: Diagnosis not present

## 2023-03-28 DIAGNOSIS — N186 End stage renal disease: Secondary | ICD-10-CM | POA: Diagnosis not present

## 2023-03-28 DIAGNOSIS — Z992 Dependence on renal dialysis: Secondary | ICD-10-CM | POA: Diagnosis not present

## 2023-03-30 DIAGNOSIS — E1122 Type 2 diabetes mellitus with diabetic chronic kidney disease: Secondary | ICD-10-CM | POA: Diagnosis not present

## 2023-03-30 DIAGNOSIS — D689 Coagulation defect, unspecified: Secondary | ICD-10-CM | POA: Diagnosis not present

## 2023-03-30 DIAGNOSIS — E8770 Fluid overload, unspecified: Secondary | ICD-10-CM | POA: Diagnosis not present

## 2023-03-30 DIAGNOSIS — T8249XA Other complication of vascular dialysis catheter, initial encounter: Secondary | ICD-10-CM | POA: Diagnosis not present

## 2023-03-30 DIAGNOSIS — A499 Bacterial infection, unspecified: Secondary | ICD-10-CM | POA: Diagnosis not present

## 2023-03-30 DIAGNOSIS — N186 End stage renal disease: Secondary | ICD-10-CM | POA: Diagnosis not present

## 2023-03-30 DIAGNOSIS — D631 Anemia in chronic kidney disease: Secondary | ICD-10-CM | POA: Diagnosis not present

## 2023-03-30 DIAGNOSIS — Z992 Dependence on renal dialysis: Secondary | ICD-10-CM | POA: Diagnosis not present

## 2023-03-30 DIAGNOSIS — Z8631 Personal history of diabetic foot ulcer: Secondary | ICD-10-CM | POA: Diagnosis not present

## 2023-03-30 DIAGNOSIS — Z5181 Encounter for therapeutic drug level monitoring: Secondary | ICD-10-CM | POA: Diagnosis not present

## 2023-03-30 DIAGNOSIS — D688 Other specified coagulation defects: Secondary | ICD-10-CM | POA: Diagnosis not present

## 2023-03-30 DIAGNOSIS — N2581 Secondary hyperparathyroidism of renal origin: Secondary | ICD-10-CM | POA: Diagnosis not present

## 2023-04-02 DIAGNOSIS — D689 Coagulation defect, unspecified: Secondary | ICD-10-CM | POA: Diagnosis not present

## 2023-04-02 DIAGNOSIS — T8249XA Other complication of vascular dialysis catheter, initial encounter: Secondary | ICD-10-CM | POA: Diagnosis not present

## 2023-04-02 DIAGNOSIS — N186 End stage renal disease: Secondary | ICD-10-CM | POA: Diagnosis not present

## 2023-04-02 DIAGNOSIS — Z5181 Encounter for therapeutic drug level monitoring: Secondary | ICD-10-CM | POA: Diagnosis not present

## 2023-04-02 DIAGNOSIS — D631 Anemia in chronic kidney disease: Secondary | ICD-10-CM | POA: Diagnosis not present

## 2023-04-02 DIAGNOSIS — Z8631 Personal history of diabetic foot ulcer: Secondary | ICD-10-CM | POA: Diagnosis not present

## 2023-04-02 DIAGNOSIS — E8770 Fluid overload, unspecified: Secondary | ICD-10-CM | POA: Diagnosis not present

## 2023-04-02 DIAGNOSIS — E1122 Type 2 diabetes mellitus with diabetic chronic kidney disease: Secondary | ICD-10-CM | POA: Diagnosis not present

## 2023-04-02 DIAGNOSIS — Z992 Dependence on renal dialysis: Secondary | ICD-10-CM | POA: Diagnosis not present

## 2023-04-02 DIAGNOSIS — N2581 Secondary hyperparathyroidism of renal origin: Secondary | ICD-10-CM | POA: Diagnosis not present

## 2023-04-02 DIAGNOSIS — D688 Other specified coagulation defects: Secondary | ICD-10-CM | POA: Diagnosis not present

## 2023-04-02 DIAGNOSIS — A499 Bacterial infection, unspecified: Secondary | ICD-10-CM | POA: Diagnosis not present

## 2023-04-06 DIAGNOSIS — E8770 Fluid overload, unspecified: Secondary | ICD-10-CM | POA: Diagnosis not present

## 2023-04-06 DIAGNOSIS — Z8631 Personal history of diabetic foot ulcer: Secondary | ICD-10-CM | POA: Diagnosis not present

## 2023-04-06 DIAGNOSIS — E1122 Type 2 diabetes mellitus with diabetic chronic kidney disease: Secondary | ICD-10-CM | POA: Diagnosis not present

## 2023-04-06 DIAGNOSIS — T8249XA Other complication of vascular dialysis catheter, initial encounter: Secondary | ICD-10-CM | POA: Diagnosis not present

## 2023-04-06 DIAGNOSIS — Z992 Dependence on renal dialysis: Secondary | ICD-10-CM | POA: Diagnosis not present

## 2023-04-06 DIAGNOSIS — A499 Bacterial infection, unspecified: Secondary | ICD-10-CM | POA: Diagnosis not present

## 2023-04-06 DIAGNOSIS — Z5181 Encounter for therapeutic drug level monitoring: Secondary | ICD-10-CM | POA: Diagnosis not present

## 2023-04-06 DIAGNOSIS — D688 Other specified coagulation defects: Secondary | ICD-10-CM | POA: Diagnosis not present

## 2023-04-06 DIAGNOSIS — D689 Coagulation defect, unspecified: Secondary | ICD-10-CM | POA: Diagnosis not present

## 2023-04-06 DIAGNOSIS — D631 Anemia in chronic kidney disease: Secondary | ICD-10-CM | POA: Diagnosis not present

## 2023-04-06 DIAGNOSIS — N2581 Secondary hyperparathyroidism of renal origin: Secondary | ICD-10-CM | POA: Diagnosis not present

## 2023-04-06 DIAGNOSIS — N186 End stage renal disease: Secondary | ICD-10-CM | POA: Diagnosis not present

## 2023-04-09 ENCOUNTER — Encounter (HOSPITAL_COMMUNITY): Payer: Self-pay | Admitting: Family Medicine

## 2023-04-09 ENCOUNTER — Other Ambulatory Visit: Payer: Self-pay

## 2023-04-09 ENCOUNTER — Emergency Department (HOSPITAL_COMMUNITY): Payer: Medicare HMO

## 2023-04-09 ENCOUNTER — Inpatient Hospital Stay (HOSPITAL_COMMUNITY)
Admission: EM | Admit: 2023-04-09 | Discharge: 2023-04-14 | DRG: 871 | Discharge status: Against Medical Advice | Payer: Medicare HMO | Source: Ambulatory Visit | Attending: Internal Medicine | Admitting: Internal Medicine

## 2023-04-09 DIAGNOSIS — Z992 Dependence on renal dialysis: Secondary | ICD-10-CM | POA: Diagnosis not present

## 2023-04-09 DIAGNOSIS — Z794 Long term (current) use of insulin: Secondary | ICD-10-CM | POA: Diagnosis not present

## 2023-04-09 DIAGNOSIS — M898X9 Other specified disorders of bone, unspecified site: Secondary | ICD-10-CM | POA: Diagnosis present

## 2023-04-09 DIAGNOSIS — L97519 Non-pressure chronic ulcer of other part of right foot with unspecified severity: Secondary | ICD-10-CM | POA: Diagnosis not present

## 2023-04-09 DIAGNOSIS — E10628 Type 1 diabetes mellitus with other skin complications: Secondary | ICD-10-CM | POA: Diagnosis present

## 2023-04-09 DIAGNOSIS — R7881 Bacteremia: Secondary | ICD-10-CM | POA: Diagnosis not present

## 2023-04-09 DIAGNOSIS — I82C21 Chronic embolism and thrombosis of right internal jugular vein: Secondary | ICD-10-CM | POA: Diagnosis present

## 2023-04-09 DIAGNOSIS — D689 Coagulation defect, unspecified: Secondary | ICD-10-CM | POA: Diagnosis not present

## 2023-04-09 DIAGNOSIS — K760 Fatty (change of) liver, not elsewhere classified: Secondary | ICD-10-CM | POA: Diagnosis present

## 2023-04-09 DIAGNOSIS — Z89612 Acquired absence of left leg above knee: Secondary | ICD-10-CM

## 2023-04-09 DIAGNOSIS — Y712 Prosthetic and other implants, materials and accessory cardiovascular devices associated with adverse incidents: Secondary | ICD-10-CM | POA: Diagnosis present

## 2023-04-09 DIAGNOSIS — R7989 Other specified abnormal findings of blood chemistry: Secondary | ICD-10-CM | POA: Diagnosis not present

## 2023-04-09 DIAGNOSIS — E109 Type 1 diabetes mellitus without complications: Secondary | ICD-10-CM | POA: Diagnosis present

## 2023-04-09 DIAGNOSIS — E871 Hypo-osmolality and hyponatremia: Secondary | ICD-10-CM | POA: Diagnosis present

## 2023-04-09 DIAGNOSIS — N186 End stage renal disease: Secondary | ICD-10-CM | POA: Diagnosis present

## 2023-04-09 DIAGNOSIS — K3184 Gastroparesis: Secondary | ICD-10-CM | POA: Diagnosis not present

## 2023-04-09 DIAGNOSIS — Z743 Need for continuous supervision: Secondary | ICD-10-CM | POA: Diagnosis not present

## 2023-04-09 DIAGNOSIS — A4102 Sepsis due to Methicillin resistant Staphylococcus aureus: Principal | ICD-10-CM | POA: Diagnosis present

## 2023-04-09 DIAGNOSIS — Z833 Family history of diabetes mellitus: Secondary | ICD-10-CM | POA: Diagnosis not present

## 2023-04-09 DIAGNOSIS — R059 Cough, unspecified: Secondary | ICD-10-CM | POA: Diagnosis not present

## 2023-04-09 DIAGNOSIS — R197 Diarrhea, unspecified: Secondary | ICD-10-CM | POA: Diagnosis present

## 2023-04-09 DIAGNOSIS — E11628 Type 2 diabetes mellitus with other skin complications: Secondary | ICD-10-CM | POA: Diagnosis not present

## 2023-04-09 DIAGNOSIS — Z1152 Encounter for screening for COVID-19: Secondary | ICD-10-CM | POA: Diagnosis not present

## 2023-04-09 DIAGNOSIS — L089 Local infection of the skin and subcutaneous tissue, unspecified: Secondary | ICD-10-CM | POA: Diagnosis not present

## 2023-04-09 DIAGNOSIS — T82594S Other mechanical complication of infusion catheter, sequela: Secondary | ICD-10-CM

## 2023-04-09 DIAGNOSIS — E1043 Type 1 diabetes mellitus with diabetic autonomic (poly)neuropathy: Secondary | ICD-10-CM | POA: Diagnosis not present

## 2023-04-09 DIAGNOSIS — I120 Hypertensive chronic kidney disease with stage 5 chronic kidney disease or end stage renal disease: Secondary | ICD-10-CM | POA: Diagnosis present

## 2023-04-09 DIAGNOSIS — E10621 Type 1 diabetes mellitus with foot ulcer: Secondary | ICD-10-CM | POA: Diagnosis not present

## 2023-04-09 DIAGNOSIS — Z841 Family history of disorders of kidney and ureter: Secondary | ICD-10-CM

## 2023-04-09 DIAGNOSIS — E1122 Type 2 diabetes mellitus with diabetic chronic kidney disease: Secondary | ICD-10-CM | POA: Diagnosis not present

## 2023-04-09 DIAGNOSIS — L03115 Cellulitis of right lower limb: Secondary | ICD-10-CM | POA: Diagnosis not present

## 2023-04-09 DIAGNOSIS — B9562 Methicillin resistant Staphylococcus aureus infection as the cause of diseases classified elsewhere: Secondary | ICD-10-CM | POA: Diagnosis not present

## 2023-04-09 DIAGNOSIS — E11621 Type 2 diabetes mellitus with foot ulcer: Secondary | ICD-10-CM | POA: Diagnosis not present

## 2023-04-09 DIAGNOSIS — Z89431 Acquired absence of right foot: Secondary | ICD-10-CM

## 2023-04-09 DIAGNOSIS — T8249XA Other complication of vascular dialysis catheter, initial encounter: Secondary | ICD-10-CM | POA: Diagnosis not present

## 2023-04-09 DIAGNOSIS — E1022 Type 1 diabetes mellitus with diabetic chronic kidney disease: Secondary | ICD-10-CM | POA: Diagnosis not present

## 2023-04-09 DIAGNOSIS — Z91199 Patient's noncompliance with other medical treatment and regimen due to unspecified reason: Secondary | ICD-10-CM

## 2023-04-09 DIAGNOSIS — A419 Sepsis, unspecified organism: Principal | ICD-10-CM

## 2023-04-09 DIAGNOSIS — D688 Other specified coagulation defects: Secondary | ICD-10-CM | POA: Diagnosis not present

## 2023-04-09 DIAGNOSIS — T82828A Fibrosis of vascular prosthetic devices, implants and grafts, initial encounter: Secondary | ICD-10-CM | POA: Diagnosis not present

## 2023-04-09 DIAGNOSIS — E1065 Type 1 diabetes mellitus with hyperglycemia: Secondary | ICD-10-CM | POA: Diagnosis present

## 2023-04-09 DIAGNOSIS — R000 Tachycardia, unspecified: Secondary | ICD-10-CM | POA: Diagnosis not present

## 2023-04-09 DIAGNOSIS — Z5181 Encounter for therapeutic drug level monitoring: Secondary | ICD-10-CM | POA: Diagnosis not present

## 2023-04-09 DIAGNOSIS — D696 Thrombocytopenia, unspecified: Secondary | ICD-10-CM | POA: Diagnosis not present

## 2023-04-09 DIAGNOSIS — Z5329 Procedure and treatment not carried out because of patient's decision for other reasons: Secondary | ICD-10-CM | POA: Diagnosis present

## 2023-04-09 DIAGNOSIS — Z4901 Encounter for fitting and adjustment of extracorporeal dialysis catheter: Secondary | ICD-10-CM | POA: Diagnosis not present

## 2023-04-09 DIAGNOSIS — N250 Renal osteodystrophy: Secondary | ICD-10-CM | POA: Diagnosis not present

## 2023-04-09 DIAGNOSIS — L97419 Non-pressure chronic ulcer of right heel and midfoot with unspecified severity: Secondary | ICD-10-CM | POA: Diagnosis not present

## 2023-04-09 DIAGNOSIS — A499 Bacterial infection, unspecified: Secondary | ICD-10-CM | POA: Diagnosis not present

## 2023-04-09 DIAGNOSIS — D631 Anemia in chronic kidney disease: Secondary | ICD-10-CM | POA: Diagnosis not present

## 2023-04-09 DIAGNOSIS — E8770 Fluid overload, unspecified: Secondary | ICD-10-CM | POA: Diagnosis not present

## 2023-04-09 DIAGNOSIS — R0689 Other abnormalities of breathing: Secondary | ICD-10-CM | POA: Diagnosis not present

## 2023-04-09 DIAGNOSIS — Z8631 Personal history of diabetic foot ulcer: Secondary | ICD-10-CM | POA: Diagnosis not present

## 2023-04-09 DIAGNOSIS — R6520 Severe sepsis without septic shock: Secondary | ICD-10-CM | POA: Diagnosis present

## 2023-04-09 DIAGNOSIS — N2581 Secondary hyperparathyroidism of renal origin: Secondary | ICD-10-CM | POA: Diagnosis not present

## 2023-04-09 DIAGNOSIS — Z79899 Other long term (current) drug therapy: Secondary | ICD-10-CM

## 2023-04-09 DIAGNOSIS — R509 Fever, unspecified: Secondary | ICD-10-CM | POA: Diagnosis not present

## 2023-04-09 DIAGNOSIS — L97509 Non-pressure chronic ulcer of other part of unspecified foot with unspecified severity: Secondary | ICD-10-CM | POA: Diagnosis not present

## 2023-04-09 LAB — CBC WITH DIFFERENTIAL/PLATELET
Abs Immature Granulocytes: 0.16 10*3/uL — ABNORMAL HIGH (ref 0.00–0.07)
Basophils Absolute: 0 10*3/uL (ref 0.0–0.1)
Basophils Relative: 0 %
Eosinophils Absolute: 0.1 10*3/uL (ref 0.0–0.5)
Eosinophils Relative: 1 %
HCT: 33.8 % — ABNORMAL LOW (ref 39.0–52.0)
Hemoglobin: 11 g/dL — ABNORMAL LOW (ref 13.0–17.0)
Immature Granulocytes: 1 %
Lymphocytes Relative: 3 %
Lymphs Abs: 0.5 10*3/uL — ABNORMAL LOW (ref 0.7–4.0)
MCH: 29 pg (ref 26.0–34.0)
MCHC: 32.5 g/dL (ref 30.0–36.0)
MCV: 89.2 fL (ref 80.0–100.0)
Monocytes Absolute: 1 10*3/uL (ref 0.1–1.0)
Monocytes Relative: 6 %
Neutro Abs: 15.6 10*3/uL — ABNORMAL HIGH (ref 1.7–7.7)
Neutrophils Relative %: 89 %
Platelets: 111 10*3/uL — ABNORMAL LOW (ref 150–400)
RBC: 3.79 MIL/uL — ABNORMAL LOW (ref 4.22–5.81)
RDW: 14.2 % (ref 11.5–15.5)
WBC: 17.4 10*3/uL — ABNORMAL HIGH (ref 4.0–10.5)
nRBC: 0 % (ref 0.0–0.2)

## 2023-04-09 LAB — CBG MONITORING, ED: Glucose-Capillary: 223 mg/dL — ABNORMAL HIGH (ref 70–99)

## 2023-04-09 LAB — COMPREHENSIVE METABOLIC PANEL
ALT: 86 U/L — ABNORMAL HIGH (ref 0–44)
AST: 78 U/L — ABNORMAL HIGH (ref 15–41)
Albumin: 3.2 g/dL — ABNORMAL LOW (ref 3.5–5.0)
Alkaline Phosphatase: 197 U/L — ABNORMAL HIGH (ref 38–126)
Anion gap: 17 — ABNORMAL HIGH (ref 5–15)
BUN: 30 mg/dL — ABNORMAL HIGH (ref 6–20)
CO2: 20 mmol/L — ABNORMAL LOW (ref 22–32)
Calcium: 8.9 mg/dL (ref 8.9–10.3)
Chloride: 97 mmol/L — ABNORMAL LOW (ref 98–111)
Creatinine, Ser: 6.96 mg/dL — ABNORMAL HIGH (ref 0.61–1.24)
GFR, Estimated: 9 mL/min — ABNORMAL LOW (ref >60)
Glucose, Bld: 192 mg/dL — ABNORMAL HIGH (ref 70–99)
Potassium: 3.8 mmol/L (ref 3.5–5.1)
Sodium: 134 mmol/L — ABNORMAL LOW (ref 135–145)
Total Bilirubin: 2.1 mg/dL — ABNORMAL HIGH (ref 0.3–1.2)
Total Protein: 7.4 g/dL (ref 6.5–8.1)

## 2023-04-09 LAB — RESP PANEL BY RT-PCR (RSV, FLU A&B, COVID)  RVPGX2
Influenza A by PCR: NEGATIVE
Influenza B by PCR: NEGATIVE
Resp Syncytial Virus by PCR: NEGATIVE
SARS Coronavirus 2 by RT PCR: NEGATIVE

## 2023-04-09 LAB — LACTIC ACID, PLASMA: Lactic Acid, Venous: 1.8 mmol/L (ref 0.5–1.9)

## 2023-04-09 LAB — BETA-HYDROXYBUTYRIC ACID: Beta-Hydroxybutyric Acid: 1.67 mmol/L — ABNORMAL HIGH (ref 0.05–0.27)

## 2023-04-09 MED ORDER — SEVELAMER CARBONATE 800 MG PO TABS: 2400.0000 mg | ORAL_TABLET | Freq: Two times a day (BID) | ORAL | Status: DC | PRN

## 2023-04-09 MED ORDER — SODIUM CHLORIDE 0.9 % IV SOLN
1.0000 g | Freq: Once | INTRAVENOUS | Status: AC
  Administered 2023-04-09: 1 g via INTRAVENOUS
  Filled 2023-04-09: qty 10

## 2023-04-09 MED ORDER — SODIUM CHLORIDE 0.9% FLUSH
3.0000 mL | Freq: Two times a day (BID) | INTRAVENOUS | Status: DC
  Administered 2023-04-10 – 2023-04-14 (×6): 3 mL via INTRAVENOUS

## 2023-04-09 MED ORDER — SODIUM CHLORIDE 0.9 % IV SOLN
1.0000 g | INTRAVENOUS | Status: DC
  Administered 2023-04-10 – 2023-04-12 (×3): 1 g via INTRAVENOUS
  Filled 2023-04-09 (×4): qty 10

## 2023-04-09 MED ORDER — CINACALCET HCL 30 MG PO TABS
120.0000 mg | ORAL_TABLET | Freq: Every evening | ORAL | Status: DC
  Administered 2023-04-10 – 2023-04-13 (×4): 120 mg via ORAL
  Filled 2023-04-09 (×6): qty 4

## 2023-04-09 MED ORDER — LACTATED RINGERS IV BOLUS
500.0000 mL | Freq: Once | INTRAVENOUS | Status: AC
  Administered 2023-04-09: 500 mL via INTRAVENOUS

## 2023-04-09 MED ORDER — ACETAMINOPHEN 650 MG RE SUPP: 650.0000 mg | Freq: Four times a day (QID) | RECTAL | Status: DC | PRN

## 2023-04-09 MED ORDER — INSULIN ASPART 100 UNIT/ML IJ SOLN
0.0000 [IU] | INTRAMUSCULAR | Status: DC
  Administered 2023-04-09: 2 [IU] via SUBCUTANEOUS
  Administered 2023-04-10: 4 [IU] via SUBCUTANEOUS
  Administered 2023-04-10 (×2): 2 [IU] via SUBCUTANEOUS
  Administered 2023-04-10: 1 [IU] via SUBCUTANEOUS
  Administered 2023-04-11 (×2): 3 [IU] via SUBCUTANEOUS
  Administered 2023-04-11: 4 [IU] via SUBCUTANEOUS
  Administered 2023-04-12: 5 [IU] via SUBCUTANEOUS
  Administered 2023-04-12 – 2023-04-13 (×2): 3 [IU] via SUBCUTANEOUS
  Administered 2023-04-13: 2 [IU] via SUBCUTANEOUS
  Administered 2023-04-13: 5 [IU] via SUBCUTANEOUS
  Administered 2023-04-13: 4 [IU] via SUBCUTANEOUS
  Administered 2023-04-14 (×2): 5 [IU] via SUBCUTANEOUS
  Administered 2023-04-14: 4 [IU] via SUBCUTANEOUS
  Administered 2023-04-14: 6 [IU] via SUBCUTANEOUS

## 2023-04-09 MED ORDER — METRONIDAZOLE 500 MG/100ML IV SOLN
500.0000 mg | Freq: Two times a day (BID) | INTRAVENOUS | Status: DC
  Administered 2023-04-09 – 2023-04-13 (×7): 500 mg via INTRAVENOUS
  Filled 2023-04-09 (×7): qty 100

## 2023-04-09 MED ORDER — VANCOMYCIN HCL IN DEXTROSE 1-5 GM/200ML-% IV SOLN
1000.0000 mg | Freq: Once | INTRAVENOUS | Status: AC
  Administered 2023-04-09: 1000 mg via INTRAVENOUS
  Filled 2023-04-09: qty 200

## 2023-04-09 MED ORDER — SEVELAMER CARBONATE 800 MG PO TABS: 2400.0000 mg | ORAL_TABLET | ORAL | Status: DC

## 2023-04-09 MED ORDER — INSULIN GLARGINE-YFGN 100 UNIT/ML ~~LOC~~ SOLN
5.0000 [IU] | Freq: Every day | SUBCUTANEOUS | Status: DC
  Administered 2023-04-10 – 2023-04-12 (×4): 5 [IU] via SUBCUTANEOUS
  Filled 2023-04-09 (×5): qty 0.05

## 2023-04-09 MED ORDER — ACETAMINOPHEN 325 MG PO TABS
650.0000 mg | ORAL_TABLET | Freq: Four times a day (QID) | ORAL | Status: DC | PRN
  Administered 2023-04-10 – 2023-04-11 (×2): 650 mg via ORAL
  Filled 2023-04-09 (×2): qty 2

## 2023-04-09 MED ORDER — VANCOMYCIN VARIABLE DOSE PER UNSTABLE RENAL FUNCTION (PHARMACIST DOSING): Status: DC

## 2023-04-09 MED ORDER — SEVELAMER CARBONATE 800 MG PO TABS
4000.0000 mg | ORAL_TABLET | Freq: Three times a day (TID) | ORAL | Status: DC
  Administered 2023-04-10 – 2023-04-14 (×6): 4000 mg via ORAL
  Filled 2023-04-09 (×9): qty 5

## 2023-04-09 MED ORDER — VANCOMYCIN HCL 750 MG/150ML IV SOLN
750.0000 mg | Freq: Once | INTRAVENOUS | Status: AC
  Administered 2023-04-10: 750 mg via INTRAVENOUS
  Filled 2023-04-09: qty 150

## 2023-04-09 NOTE — ED Notes (Signed)
Ice water provided.

## 2023-04-09 NOTE — Progress Notes (Signed)
Pharmacy Antibiotic Note  [name] is a 42 y.o. male for which pharmacy has been consulted for cefepime and vancomycin dosing for  DFI .  Patient with a history of anemia, ESRD on HD, HTN, T2DM. Patient presenting from dialysis as staff there was concerned for sepsis.  WBC 17.4; LA 1.8; T 98.6; HR 129; RR 18 COVID neg / flu neg  Plan: Cefepime 1g q24hr (give in evenings post-HD) Flagyl per MD Vancomycin 1000 mg once in ED ---will complete load with additional 750mg  dose ---subsequent dosing per HD schedule. Pharmacy to follow. Monitor WBC, fever, renal function, cultures De-escalate when able F/u Nephrology plan  Height: 5\' 7"  (170.2 cm) Weight: 83.9 kg (185 lb) IBW/kg (Calculated) : 66.1  Temp (24hrs), Avg:99.3 F (37.4 C), Min:98.6 F (37 C), Max:99.9 F (37.7 C)  Recent Labs  Lab 04/09/23 1815  WBC 17.4*  CREATININE 6.96*  LATICACIDVEN 1.8    Estimated Creatinine Clearance: 14.2 mL/min (A) (by C-G formula based on SCr of 6.96 mg/dL (H)).    No Known Allergies  Microbiology results: Pending  Thank you for allowing pharmacy to be a part of this patient's care.  [name], PharmD, BCPS 04/09/2023 10:40 PM ED Clinical Pharmacist -  [phone]

## 2023-04-09 NOTE — ED Provider Notes (Signed)
[name] Provider Note   CSN: [id number] Arrival date & time: 04/09/23  1657     History Chief Complaint  Patient presents with   Fever    [facility] with concerns for sepsis per staff. Pt with a fever, chills, and infection to right foot. Pt also presents with a cough for weeks, some emesis. Dialysis was completed.     HPI [name] is a 42 y.o. male presenting for fever cough congestion from dialysis.  He received this session today and at the end of the session was noted to be tachycardic and febrile to 101 degrees sent in for further care and management. He is being treated for a infection on his foot.   Patient's recorded medical, surgical, social, medication list and allergies were reviewed in the Snapshot window as part of the initial history.   Review of Systems   Review of Systems  Constitutional:  Negative for chills and fever.  HENT:  Negative for ear pain and sore throat.   Eyes:  Negative for pain and visual disturbance.  Respiratory:  Negative for cough and shortness of breath.   Cardiovascular:  Negative for chest pain and palpitations.  Gastrointestinal:  Negative for abdominal pain and vomiting.  Genitourinary:  Negative for dysuria and hematuria.  Musculoskeletal:  Negative for arthralgias and back pain.  Skin:  Positive for wound. Negative for color change and rash.  Neurological:  Negative for seizures and syncope.  All other systems reviewed and are negative.   Physical Exam Updated Vital Signs BP (!) 144/87   Pulse (!) 129   Temp 98.6 F (37 C) (Oral)   Resp 14   Ht 5\' 7"  (1.702 m)   Wt 83.9 kg   SpO2 99%   BMI 28.98 kg/m  Physical Exam Vitals and nursing note reviewed.  Constitutional:      General: He is not in acute distress.    Appearance: He is well-developed.  HENT:     Head: Normocephalic and atraumatic.  Eyes:     Conjunctiva/sclera: Conjunctivae normal.   Cardiovascular:     Rate and Rhythm: Normal rate and regular rhythm.     Heart sounds: No murmur heard. Pulmonary:     Effort: Pulmonary effort is normal. No respiratory distress.     Breath sounds: Normal breath sounds.  Abdominal:     Palpations: Abdomen is soft.     Tenderness: There is no abdominal tenderness.  Musculoskeletal:        General: Deformity (see attached) present. No swelling.     Cervical back: Neck supple.  Skin:    General: Skin is warm and dry.     Capillary Refill: Capillary refill takes less than 2 seconds.  Neurological:     Mental Status: He is alert.  Psychiatric:        Mood and Affect: Mood normal.      ED Course/ Medical Decision Making/ A&P Clinical Course as of 04/09/23 2350  Fri Apr 09, 2023  1738 Sent from HD with fever and chills and AMS [CC]  2028 Spoke with Dr. [name] Bores of podiatry who recommended admission on antibiotics and they will evaluate in the morning. [CC]  2032 Admit TO [CC]    Clinical Course User Index [CC] Glyn Ade, MD    Procedures .Critical Care  Performed by: Glyn Ade, MD Authorized by: Glyn Ade, MD   Critical care provider statement:    Critical  care time (minutes):  80   Critical care was necessary to treat or prevent imminent or life-threatening deterioration of the following conditions:  Sepsis   Critical care was time spent personally by me on the following activities:  Development of treatment plan with patient or surrogate, discussions with consultants, evaluation of patient's response to treatment, examination of patient, ordering and review of laboratory studies, ordering and review of radiographic studies, ordering and performing treatments and interventions, pulse oximetry, re-evaluation of patient's condition and review of old charts   Care discussed with: admitting provider      Medications Ordered in ED Medications  cinacalcet (SENSIPAR) tablet 120 mg (has no administration in  time range)  insulin aspart (novoLOG) injection 0-6 Units (2 Units Subcutaneous Given 04/09/23 2332)  insulin glargine-yfgn (SEMGLEE) injection 5 Units (has no administration in time range)  metroNIDAZOLE (FLAGYL) IVPB 500 mg (500 mg Intravenous New Bag/Given 04/09/23 2332)  sodium chloride flush (NS) 0.9 % injection 3 mL (has no administration in time range)  acetaminophen (TYLENOL) tablet 650 mg (has no administration in time range)    Or  acetaminophen (TYLENOL) suppository 650 mg (has no administration in time range)  ceFEPIme (MAXIPIME) 1 g in sodium chloride 0.9 % 100 mL IVPB (has no administration in time range)  vancomycin variable dose per unstable renal function (pharmacist dosing) (has no administration in time range)  vancomycin (VANCOREADY) IVPB 750 mg/150 mL (has no administration in time range)  sevelamer carbonate (RENVELA) tablet 4,000 mg (has no administration in time range)  sevelamer carbonate (RENVELA) tablet 2,400 mg (has no administration in time range)  vancomycin (VANCOCIN) IVPB 1000 mg/200 mL premix (0 mg Intravenous Stopped 04/09/23 2222)  ceFEPIme (MAXIPIME) 1 g in sodium chloride 0.9 % 100 mL IVPB (0 g Intravenous Stopped 04/09/23 2324)  lactated ringers bolus 500 mL (0 mLs Intravenous Stopped 04/09/23 2324)   Medical Decision Making:   [name] is a 42 y.o. male who presented to the ED today with multiple symptoms detailed above.    Handoff received from EMS.  Patient placed on continuous vitals and telemetry monitoring while in ED which was reviewed periodically.  Complete initial physical exam performed, notably the patient  was ill-appearing. During this initial exam, patient met criteria for activation of code sepsis due to presence of the following SIRS criteria as well as suspected infectious etiology:tachypnea,tachycardia,fever,triage CBC with leukocytosis. Reviewed and confirmed nursing documentation for past medical history, family history, social  history.    Initial Assessment:   With the patient's presentation of signs and symptoms of sepsis, most likely diagnosis is bacteremia secondary to underlying infection.  Considerations for source were initiated including:Urinary tract infections, abdominal infections such as cholecystitis/cholangitis/appendicitis, pulmonary etiology, bacteremia, skin etiology such as cellulitis or fasciitis (favored in this case), neurologic etiology such as meningitis or encephalitis.  This is most consistent with an acute life/limb threatening illness complicated by underlying chronic conditions.  Initial Plan:  Activated [name] protocol code sepsis including blood cultures, lactic acid screening, and further diagnostic care and management. Therapeutically, resuscitation fluids were considered. Patient has a history of kidney failure and clinical concern for risk of volume overload therefore 30 cc/kg is not indicated. Therapeutically, antibiotics were administered on a broad-spectrum nature. Undifferentiated source: Vancomycin and cefepime were administered to cover gram-positive and gram-negative high risk infections Screening labs including CBC and Metabolic panel to evaluate for infectious or metabolic etiology of disease.  Urinalysis with reflex culture ordered to evaluate for UTI or relevant  urologic/nephrologic pathology.  CXR to evaluate for structural/infectious intrathoracic pathology.  EKG to evaluate for cardiac pathology Objective evaluation as below reviewed   Initial Study Results:   Laboratory  Leukocytosis, ESRD   EKG EKG was reviewed independently. Rate, rhythm, axis, intervals all examined and without medically relevant abnormality. ST segments without concerns for elevations.    Radiology:  All images reviewed independently. Agree with radiology report at this time.   DG Chest Portable 1 View  Result Date: 04/09/2023 CLINICAL DATA:  Fever and cough. EXAM: PORTABLE CHEST 1 VIEW  COMPARISON:  April 17, 2022 FINDINGS: Large-bore right central venous catheter stable. Cardiomediastinal silhouette is normal. Mediastinal contours appear intact. There is no evidence of focal airspace consolidation, pleural effusion or pneumothorax. Osseous structures are without acute abnormality. Soft tissues are grossly normal. IMPRESSION: No active disease. Electronically Signed   By: Ted Mcalpine M.D.   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    Final Assessment and Plan:   [facility] agreed with antibiotics and admission.  Discussed with hospitalist for accepted and admission.  Dr. [name] Bores of podiatry will plan to consult in person. Disposition:   Based on the above findings, I believe this patient is stable for admission.    Patient/family educated about specific findings on our evaluation and explained exact reasons for admission.  Patient/family educated about clinical situation and time was allowed to answer questions.   Admission team communicated with and agreed with need for admission. Patient admitted. Patient ready to move at this time.     Emergency Department Medication Summary:   Medications  cinacalcet (SENSIPAR) tablet 120 mg (has no administration in time range)  insulin aspart (novoLOG) injection 0-6 Units (2 Units Subcutaneous Given 04/09/23 2332)  insulin glargine-yfgn (SEMGLEE) injection 5 Units (has no administration in time range)  metroNIDAZOLE (FLAGYL) IVPB 500 mg (500 mg Intravenous New Bag/Given 04/09/23 2332)  sodium chloride flush (NS) 0.9 % injection 3 mL (has no administration in time range)  acetaminophen (TYLENOL) tablet 650 mg (has no administration in time range)    Or  acetaminophen (TYLENOL) suppository 650 mg (has no administration in time range)  ceFEPIme (MAXIPIME) 1 g in sodium chloride 0.9 % 100 mL IVPB (has no administration in time range)   vancomycin variable dose per unstable renal function (pharmacist dosing) (has no administration in time range)  vancomycin (VANCOREADY) IVPB 750 mg/150 mL (has no administration in time range)  sevelamer carbonate (RENVELA) tablet 4,000 mg (has no administration in time range)  sevelamer carbonate (RENVELA) tablet 2,400 mg (has no administration in time range)  vancomycin (VANCOCIN) IVPB 1000 mg/200 mL premix (0 mg Intravenous Stopped 04/09/23 2222)  ceFEPIme (MAXIPIME) 1 g in sodium chloride 0.9 % 100 mL IVPB (0 g Intravenous Stopped 04/09/23 2324)  lactated ringers bolus 500 mL (0 mLs Intravenous Stopped 04/09/23 2324)          Clinical Impression:  1. Sepsis with acute organ dysfunction without septic shock, due to unspecified organism, unspecified organ dysfunction type ([name])      Admit    Final Clinical Impression(s) / ED Diagnoses Final diagnoses:  Sepsis with acute organ dysfunction without septic shock, due to unspecified organism, unspecified organ dysfunction type [facility])    Rx / DC Orders ED Discharge Orders     None         Glyn Ade, MD  04/09/23 2350  

## 2023-04-09 NOTE — H&P (Signed)
History and Physical    [name] ZOX:[id number] DOB: [birth year] DOA: 04/09/2023  PCP: [name], NP   Patient coming from: Home   Chief Complaint: Fever, tachycardia,   HPI: [name] is a pleasant 42 y.o. male with medical history significant for hypertension, insulin-dependent diabetes mellitus, ESRD on hemodialysis, and a left AKA who presents to the emergency department with fever, chills, and elevated heart rate.   Patient has a foot wound at the plantar aspect of his right forefoot with increased drainage recently.  He denies subjective fever or chills at home but was noted to be febrile and tachycardic at dialysis today.  Dialysis was reportedly completed before the patient was sent to the ED.  ED Course: Upon arrival to the ED, patient is found to have Tmax of 37.7 C, heart rate 120s, and stable blood pressure.  Chest x-ray was negative for acute findings and respiratory virus panel was negative.  Lactic acid was reassuringly normal.  WBC is elevated to 17,400, total bilirubin is 2.1, alkaline phosphatase 197, AST 78, and ALT 86.  Podiatry (Dr. [name] Bores) was consulted with ED physician, blood cultures were collected, and the patient was given 500 mL of LR, vancomycin, and cefepime.  Review of Systems:  All other systems reviewed and apart from HPI, are negative.  Past Medical History:  Diagnosis Date   Anemia    Cellulitis and abscess of toe of left foot    Diabetes mellitus without complication ([name])    Diabetic gastroparesis ([name])    Diabetic ulcer of left midfoot associated with diabetes mellitus due to underlying condition, with necrosis of bone [facility])    Dialysis patient [facility])    Hypertension    Renal disorder    Dialysis   Sepsis [facility])     Past Surgical History:  Procedure Laterality Date   AMPUTATION Right 02/02/2018   Procedure: RIGHT FIFTH TOE AND METATARSAL AMPUTATION. Filetted toe flap metatarsal resection. Debridement Plantar Foot wound;   Surgeon: Park Liter, DPM;  Location: [facility] OR;  Service: Podiatry;  Laterality: Right;   AMPUTATION Left 08/20/2018   Procedure: FIFTH METATARSAL BONE BIOPSY;  Surgeon: Park Liter, DPM;  Location: [facility] OR;  Service: Podiatry;  Laterality: Left;   AMPUTATION Left 10/28/2018   Procedure: LEFT GREAT TOE AMPUTATION;  Surgeon: Park Liter, DPM;  Location: [facility] OR;  Service: Podiatry;  Laterality: Left;   AMPUTATION Left 09/16/2020   Procedure: AMPUTATION BELOW KNEE;  Surgeon: Larina Earthly, MD;  Location: [facility] OR;  Service: Vascular;  Laterality: Left;   AMPUTATION Left 10/15/2020   Procedure: AMPUTATION ABOVE KNEE;  Surgeon: Sherren Kerns, MD;  Location: [facility] OR;  Service: Vascular;  Laterality: Left;   APPLICATION OF WOUND VAC  02/02/2018   Procedure: APPLICATION OF WOUND VAC  Right Foot;  Surgeon: Park Liter, DPM;  Location: [facility] OR;  Service: Podiatry;;   APPLICATION OF WOUND VAC Left 10/28/2018   Procedure: APPLICATION OF WOUND VAC LEFT TOE;  Surgeon: Park Liter, DPM;  Location: [facility] OR;  Service: Podiatry;  Laterality: Left;   APPLICATION OF WOUND VAC Left 11/01/2018   Procedure: APPLICATION OF WOUND VAC;  Surgeon: Park Liter, DPM;  Location: [facility] OR;  Service: Podiatry;  Laterality: Left;   AV FISTULA PLACEMENT     left arm.   AV FISTULA PLACEMENT Right 12/22/2016   Procedure: INSERTION OF ARTERIOVENOUS (AV) GORE-TEX GRAFT ARM;  Surgeon: Sherren Kerns, MD;  Location: [facility] OR;  Service: Vascular;  Laterality: Right;   AV FISTULA PLACEMENT Left 05/26/2018   Procedure: INSERTION OF  ARTERIOVENOUS (AV) GORE-TEX GRAFT LEFT ARM;  Surgeon: Nada Libman, MD;  Location: [facility] OR;  Service: Vascular;  Laterality: Left;   EYE SURGERY     I & D EXTREMITY Right 10/31/2017   Procedure: IRRIGATION AND DEBRIDEMENT RIGHT FOOT;  Surgeon: Park Liter, DPM;  Location: [facility] OR;  Service: Podiatry;  Laterality: Right;   I & D EXTREMITY Left 08/20/2018   Procedure: IRRIGATION AND DEBRIDEMENT  EXTREMITY WITH SECONDARY WOUND CLOSUREAND APPLICATION OF WOUND VAC LEFT FOOT;  Surgeon: Park Liter, DPM;  Location: [facility] OR;  Service: Podiatry;  Laterality: Left;   I & D EXTREMITY Left 10/20/2018   Procedure: IRRIGATION AND DEBRIDEMENT LEFT FOOT  DEBRIDEMENT LATERAL FOOT WOUND;  Surgeon: Park Liter, DPM;  Location: [facility] OR;  Service: Podiatry;  Laterality: Left;   I & D EXTREMITY Left 10/28/2018   Procedure: IRRIGATION AND DEBRIDEMENT LEFT TOE;  Surgeon: Park Liter, DPM;  Location: [facility] OR;  Service: Podiatry;  Laterality: Left;   I & D EXTREMITY Left 09/14/2020   Procedure: IRRIGATION AND DEBRIDEMENT WRIST;  Surgeon: Betha Loa, MD;  Location: [facility] OR;  Service: Orthopedics;  Laterality: Left;   INSERTION OF DIALYSIS CATHETER     Right subclavian   IR AV DIALY SHUNT INTRO NEEDLE/INTRACATH INITIAL W/PTA/IMG RIGHT Right 02/05/2018   IR FLUORO GUIDE CV LINE RIGHT  01/31/2020   IR FLUORO GUIDE CV LINE RIGHT  01/30/2022   IR PTA VENOUS EXCEPT DIALYSIS CIRCUIT  01/30/2022   IR THROMBECTOMY AV FISTULA W/THROMBOLYSIS/PTA INC/SHUNT/IMG LEFT Left 08/24/2018   IR THROMBECTOMY AV FISTULA W/THROMBOLYSIS/PTA INC/SHUNT/IMG LEFT Left 01/06/2019   IR US GUIDE VASC ACCESS LEFT  08/24/2018   IR US GUIDE VASC ACCESS RIGHT  02/05/2018   IR US GUIDE VASC ACCESS RIGHT  01/31/2020   IR VENOCAVAGRAM SVC  01/30/2022   IRRIGATION AND DEBRIDEMENT FOOT Right 10/23/2018   Procedure: Irrigation and Debridement to tendon, Left Foot;  Surgeon: Park Liter, DPM;  Location: [facility] OR;  Service: Podiatry;  Laterality: Right;   IRRIGATION AND DEBRIDEMENT FOOT Left 11/01/2018   Procedure: IRRIGATION AND DEBRIDEMENT PARTIAL WOUND CLOSURE LOCAL TISSUE TRANSFER AND FLAP ROTATION, LEFT FOOT;  Surgeon: Park Liter, DPM;  Location: [facility] OR;  Service: Podiatry;  Laterality: Left;   TRANSMETATARSAL AMPUTATION N/A 08/18/2018   Procedure: IRRIGATION AND DEBRIDEMENT OF LEFT 5TH TOE AND TRANSMETATARSAL, WITH PARTICAL LEFT 5TH TOE AND  METATARSAL AMPUTATION, BONE BIOPSY, WOUND VAC APPLICATION.;  Surgeon: Park Liter, DPM;  Location: [facility] OR;  Service: Podiatry;  Laterality: N/A;   UPPER EXTREMITY VENOGRAPHY N/A 11/16/2016   Procedure: Upper Extremity Venography - Right Central;  Surgeon: Sherren Kerns, MD;  Location: [facility] INVASIVE CV LAB;  Service: Cardiovascular;  Laterality: N/A;   UPPER EXTREMITY VENOGRAPHY N/A 05/25/2018   Procedure: UPPER EXTREMITY VENOGRAPHY - Bilateral;  Surgeon: Cephus Shelling, MD;  Location: [facility] INVASIVE CV LAB;  Service: Cardiovascular;  Laterality: N/A;    Social History:   reports that he has never smoked. He has never used smokeless tobacco. He reports that he does not drink alcohol and does not use drugs.  No Known Allergies  Family History  Problem Relation Age of Onset   Diabetes Mellitus II Other    Diabetes Father    Renal Disease Father        ESRD     Prior to Admission  medications   Medication Sig Start Date End Date Taking? Authorizing Provider  calcitRIOL (ROCALTROL) 0.5 MCG capsule Take 3 capsules (1.5 mcg total) by mouth every Monday, Wednesday, and Friday with hemodialysis. 05/23/21   Kathlen Mody, MD  cinacalcet (SENSIPAR) 60 MG tablet Take 120 mg by mouth every evening. 04/10/22   [provider]  gabapentin (NEURONTIN) 100 MG capsule Take 1 capsule (100 mg total) by mouth 3 (three) times daily. 02/17/23   [name], NP  glucose blood (ONETOUCH VERIO) test strip USE 1 STRIP TO CHECK GLUCOSE THREE TIMES DAILY AS DIRECTED 03/22/23   [name], NP  insulin NPH-regular Human (NOVOLIN 70/30) (70-30) 100 UNIT/ML injection Inject 10 Units into the skin 2 (two) times daily with a meal. 12/18/22   [name], NP  insulin regular (NOVOLIN R RELION) 100 units/mL injection INJECT 12 UNITS SUBCUTANEOUSLY THREE TIMES DAILY BEFORE MEAL(S) 12/18/22   [name], NP  multivitamin (RENA-VIT) TABS tablet Take 1 tablet by mouth at bedtime.  05/23/21   Kathlen Mody, MD  sevelamer carbonate (RENVELA) 800 MG tablet Take 2,400-4,000 mg by mouth See admin instructions. Take 4,000 mg (5 tablets) by mouth with meals and 2,400 mg (3 tablets) with snacks 04/27/19   [provider]    Physical Exam: Vitals:   04/09/23 1703 04/09/23 1706 04/09/23 1707 04/09/23 2114  BP:   (!) 117/46   Pulse:   (!) 129   Temp:   99.9 F (37.7 C) 98.6 F (37 C)  TempSrc:   Oral Oral  SpO2: 99%  99%   Weight:  83.9 kg    Height:  5\' 7"  (1.702 m)      Constitutional: NAD, no pallor or diaphoresis   Eyes: PERTLA, lids and conjunctivae normal ENMT: Mucous membranes are moist. Posterior pharynx clear of any exudate or lesions.   Neck: supple, no masses  Respiratory: no wheezing, no crackles. No accessory muscle use.  Cardiovascular: Rate ~120 and regular. No extremity edema.   Abdomen: No distension, no tenderness, soft. Bowel sounds active.  Musculoskeletal: no clubbing / cyanosis. No joint deformity upper and lower extremities.   Skin: Right foot ulceration over plantar aspect of 2nd MTP with bloody drainage. Skin otherwise warm, dry, well-perfused. Neurologic: CN 2-12 grossly intact. Moving all extremities. Sleeping, wakes to voice.  Psychiatric: Calm. Cooperative.    Labs and Imaging on Admission: I have personally reviewed following labs and imaging studies  CBC: Recent Labs  Lab 04/09/23 1815  WBC 17.4*  NEUTROABS 15.6*  HGB 11.0*  HCT 33.8*  MCV 89.2  PLT 111*   Basic Metabolic Panel: Recent Labs  Lab 04/09/23 1815  NA 134*  K 3.8  CL 97*  CO2 20*  GLUCOSE 192*  BUN 30*  CREATININE 6.96*  CALCIUM 8.9   GFR: Estimated Creatinine Clearance: 14.2 mL/min (A) (by C-G formula based on SCr of 6.96 mg/dL (H)). Liver Function Tests: Recent Labs  Lab 04/09/23 1815  AST 78*  ALT 86*  ALKPHOS 197*  BILITOT 2.1*  PROT 7.4  ALBUMIN 3.2*   No results for input(s): "LIPASE", "AMYLASE" in the last 168 hours. No results  for input(s): "AMMONIA" in the last 168 hours. Coagulation Profile: No results for input(s): "INR", "PROTIME" in the last 168 hours. Cardiac Enzymes: No results for input(s): "CKTOTAL", "CKMB", "CKMBINDEX", "TROPONINI" in the last 168 hours. BNP (last 3 results) No results for input(s): "PROBNP" in the last 8760 hours. HbA1C: No results for input(s): "HGBA1C"  in the last 72 hours. CBG: Recent Labs  Lab 04/09/23 2204  GLUCAP 223*   Lipid Profile: No results for input(s): "CHOL", "HDL", "LDLCALC", "TRIG", "CHOLHDL", "LDLDIRECT" in the last 72 hours. Thyroid Function Tests: No results for input(s): "TSH", "T4TOTAL", "FREET4", "T3FREE", "THYROIDAB" in the last 72 hours. Anemia Panel: No results for input(s): "VITAMINB12", "FOLATE", "FERRITIN", "TIBC", "IRON", "RETICCTPCT" in the last 72 hours. Urine analysis:    Component Value Date/Time   COLORURINE YELLOW 05/26/2015 1115   APPEARANCEUR CLEAR 05/26/2015 1115   APPEARANCEUR Hazy 07/30/2014 2303   LABSPEC 1.016 05/26/2015 1115   LABSPEC 1.012 07/30/2014 2303   PHURINE 8.0 05/26/2015 1115   GLUCOSEU 500 (A) 05/26/2015 1115   GLUCOSEU >=500 07/30/2014 2303   HGBUR MODERATE (A) 05/26/2015 1115   BILIRUBINUR NEGATIVE 05/26/2015 1115   BILIRUBINUR Negative 07/30/2014 2303   KETONESUR 15 (A) 05/26/2015 1115   PROTEINUR >300 (A) 05/26/2015 1115   UROBILINOGEN 0.2 05/26/2015 1115   NITRITE NEGATIVE 05/26/2015 1115   LEUKOCYTESUR NEGATIVE 05/26/2015 1115   LEUKOCYTESUR Negative 07/30/2014 2303   Sepsis Labs: @LABRCNTIP (procalcitonin:4,lacticidven:4) ) Recent Results (from the past 240 hour(s))  Resp panel by RT-PCR (RSV, Flu A&B, Covid) Anterior Nasal Swab     Status: None   Collection Time: 04/09/23  5:36 PM   Specimen: Anterior Nasal Swab  Result Value Ref Range Status   SARS Coronavirus 2 by RT PCR NEGATIVE NEGATIVE Final   Influenza A by PCR NEGATIVE NEGATIVE Final   Influenza B by PCR NEGATIVE NEGATIVE Final    Comment:  (NOTE) The Xpert Xpress SARS-CoV-2/FLU/RSV plus assay is intended as an aid in the diagnosis of influenza from Nasopharyngeal swab specimens and should not be used as a sole basis for treatment. Nasal washings and aspirates are unacceptable for Xpert Xpress SARS-CoV-2/FLU/RSV testing.  Fact Sheet for Patients: BloggerCourse.com  Fact Sheet for Healthcare Providers: SeriousBroker.it  This test is not yet approved or cleared by the [place] FDA and has been authorized for detection and/or diagnosis of SARS-CoV-2 by FDA under an Emergency Use Authorization (EUA). This EUA will remain in effect (meaning this test can be used) for the duration of the COVID-19 declaration under Section 564(b)(1) of the Act, 21 U.S.C. section 360bbb-3(b)(1), unless the authorization is terminated or revoked.     Resp Syncytial Virus by PCR NEGATIVE NEGATIVE Final    Comment: (NOTE) Fact Sheet for Patients: BloggerCourse.com  Fact Sheet for Healthcare Providers: SeriousBroker.it  This test is not yet approved or cleared by the [place] FDA and has been authorized for detection and/or diagnosis of SARS-CoV-2 by FDA under an Emergency Use Authorization (EUA). This EUA will remain in effect (meaning this test can be used) for the duration of the COVID-19 declaration under Section 564(b)(1) of the Act, 21 U.S.C. section 360bbb-3(b)(1), unless the authorization is terminated or revoked.  Performed at [facility], [address] [place], [name] [address]      Radiological Exams on Admission: DG Chest Portable 1 View  Result Date: 04/09/2023 CLINICAL DATA:  Fever and cough. EXAM: PORTABLE CHEST 1 VIEW COMPARISON:  April 17, 2022 FINDINGS: Large-bore right central venous catheter stable. Cardiomediastinal silhouette is normal. Mediastinal contours appear intact. There is no evidence of  focal airspace consolidation, pleural effusion or pneumothorax. Osseous structures are without acute abnormality. Soft tissues are grossly normal. IMPRESSION: No active disease. Electronically Signed   By: Ted Mcalpine M.D.   On: 04/09/2023 18:41    EKG: Independently reviewed. Regular  narrow complex tachycardia, rate 129, QTc 531.   Assessment/Plan   1. Diabetic foot infection  - Presents from dialysis center with fever and tachycardia and is found to have leukocytosis and right foot infection  - Podiatry was consulted by ED MD, blood cultures were collected, and broad-spectrum antibiotics were started in ED  - Continue antibiotics, check ABI, keep NPO after midnight   2. ESRD  - Reports completed HD just prior to arrival in ED  - Restrict fluids, renally-dose medications, consult nephrology in am for maintenance dialysis while hospitalized   3. Insulin-dependent DM  - A1c was 9.5% in February 2024  - Check CBGs and continue insulin    4. Elevated LFTs  - Alk phos 197, AST 78, ALT 86, and t bili 2.1 on admission   - Check viral hepatitis panel and RUQ [place], repeat CMP in am    5. Thrombocytopenia  - Platelets 111,000 on admission  - Appears chronic, no bleeding, will monitor     DVT prophylaxis: SCD Code Status: Full  Level of Care: Level of care: Progressive Family Communication: None present  Disposition Plan:  Patient is from: home  Anticipated d/c is to: TBD Anticipated d/c date is: 04/12/23  Patient currently: Pending podiatry consultation and likely surgery  Consults called: Podiatry  Admission status: Inpatient    Briscoe Deutscher, MD [facility]  04/09/2023, 11:30 PM

## 2023-04-10 ENCOUNTER — Encounter (HOSPITAL_COMMUNITY): Payer: Medicare HMO

## 2023-04-10 ENCOUNTER — Inpatient Hospital Stay (HOSPITAL_COMMUNITY): Payer: Medicare HMO

## 2023-04-10 DIAGNOSIS — E11621 Type 2 diabetes mellitus with foot ulcer: Secondary | ICD-10-CM | POA: Diagnosis not present

## 2023-04-10 DIAGNOSIS — E1022 Type 1 diabetes mellitus with diabetic chronic kidney disease: Secondary | ICD-10-CM

## 2023-04-10 DIAGNOSIS — L97509 Non-pressure chronic ulcer of other part of unspecified foot with unspecified severity: Secondary | ICD-10-CM

## 2023-04-10 DIAGNOSIS — Z794 Long term (current) use of insulin: Secondary | ICD-10-CM

## 2023-04-10 DIAGNOSIS — E1122 Type 2 diabetes mellitus with diabetic chronic kidney disease: Secondary | ICD-10-CM

## 2023-04-10 DIAGNOSIS — D696 Thrombocytopenia, unspecified: Secondary | ICD-10-CM | POA: Diagnosis not present

## 2023-04-10 DIAGNOSIS — R7989 Other specified abnormal findings of blood chemistry: Secondary | ICD-10-CM | POA: Diagnosis not present

## 2023-04-10 DIAGNOSIS — E11628 Type 2 diabetes mellitus with other skin complications: Secondary | ICD-10-CM

## 2023-04-10 DIAGNOSIS — N186 End stage renal disease: Secondary | ICD-10-CM | POA: Diagnosis not present

## 2023-04-10 LAB — COMPREHENSIVE METABOLIC PANEL
ALT: 67 U/L — ABNORMAL HIGH (ref 0–44)
AST: 56 U/L — ABNORMAL HIGH (ref 15–41)
Albumin: 2.9 g/dL — ABNORMAL LOW (ref 3.5–5.0)
Alkaline Phosphatase: 168 U/L — ABNORMAL HIGH (ref 38–126)
Anion gap: 19 — ABNORMAL HIGH (ref 5–15)
BUN: 37 mg/dL — ABNORMAL HIGH (ref 6–20)
CO2: 16 mmol/L — ABNORMAL LOW (ref 22–32)
Calcium: 8.5 mg/dL — ABNORMAL LOW (ref 8.9–10.3)
Chloride: 97 mmol/L — ABNORMAL LOW (ref 98–111)
Creatinine, Ser: 7.78 mg/dL — ABNORMAL HIGH (ref 0.61–1.24)
GFR, Estimated: 8 mL/min — ABNORMAL LOW (ref >60)
Glucose, Bld: 132 mg/dL — ABNORMAL HIGH (ref 70–99)
Potassium: 3.7 mmol/L (ref 3.5–5.1)
Sodium: 132 mmol/L — ABNORMAL LOW (ref 135–145)
Total Bilirubin: 1.6 mg/dL — ABNORMAL HIGH (ref 0.3–1.2)
Total Protein: 6.5 g/dL (ref 6.5–8.1)

## 2023-04-10 LAB — C-REACTIVE PROTEIN: CRP: 22.3 mg/dL — ABNORMAL HIGH (ref <1.0)

## 2023-04-10 LAB — GLUCOSE, CAPILLARY
Glucose-Capillary: 210 mg/dL — ABNORMAL HIGH (ref 70–99)
Glucose-Capillary: 313 mg/dL — ABNORMAL HIGH (ref 70–99)

## 2023-04-10 LAB — BLOOD CULTURE ID PANEL (REFLEXED) - BCID2

## 2023-04-10 LAB — C DIFFICILE QUICK SCREEN W PCR REFLEX
C Diff antigen: NEGATIVE
C Diff interpretation: NOT DETECTED
C Diff toxin: NEGATIVE

## 2023-04-10 LAB — GASTROINTESTINAL PANEL BY PCR, STOOL (REPLACES STOOL CULTURE)

## 2023-04-10 LAB — CBC
HCT: 34 % — ABNORMAL LOW (ref 39.0–52.0)
Hemoglobin: 10.7 g/dL — ABNORMAL LOW (ref 13.0–17.0)
MCH: 29.4 pg (ref 26.0–34.0)
MCHC: 31.5 g/dL (ref 30.0–36.0)
MCV: 93.4 fL (ref 80.0–100.0)
Platelets: 106 10*3/uL — ABNORMAL LOW (ref 150–400)
RBC: 3.64 MIL/uL — ABNORMAL LOW (ref 4.22–5.81)
RDW: 14.3 % (ref 11.5–15.5)
WBC: 18.2 10*3/uL — ABNORMAL HIGH (ref 4.0–10.5)
nRBC: 0 % (ref 0.0–0.2)

## 2023-04-10 LAB — VAS US ABI WITH/WO TBI

## 2023-04-10 LAB — CBG MONITORING, ED
Glucose-Capillary: 169 mg/dL — ABNORMAL HIGH (ref 70–99)
Glucose-Capillary: 102 mg/dL — ABNORMAL HIGH (ref 70–99)
Glucose-Capillary: 226 mg/dL — ABNORMAL HIGH (ref 70–99)
Glucose-Capillary: 175 mg/dL — ABNORMAL HIGH (ref 70–99)

## 2023-04-10 LAB — HEPATITIS PANEL, ACUTE
HCV Ab: NONREACTIVE
Hep A IgM: NONREACTIVE
Hep B C IgM: NONREACTIVE
Hepatitis B Surface Ag: NONREACTIVE

## 2023-04-10 LAB — SEDIMENTATION RATE: Sed Rate: 46 mm/hr — ABNORMAL HIGH (ref 0–16)

## 2023-04-10 LAB — PREALBUMIN: Prealbumin: 12 mg/dL — ABNORMAL LOW (ref 18–38)

## 2023-04-10 LAB — HIV ANTIBODY (ROUTINE TESTING W REFLEX): HIV Screen 4th Generation wRfx: NONREACTIVE

## 2023-04-10 LAB — MAGNESIUM: Magnesium: 2.3 mg/dL (ref 1.7–2.4)

## 2023-04-10 LAB — PHOSPHORUS: Phosphorus: 5.3 mg/dL — ABNORMAL HIGH (ref 2.5–4.6)

## 2023-04-10 MED ORDER — CHLORHEXIDINE GLUCONATE CLOTH 2 % EX PADS
6.0000 | MEDICATED_PAD | Freq: Every day | CUTANEOUS | Status: DC
  Administered 2023-04-10 – 2023-04-14 (×4): 6 via TOPICAL

## 2023-04-10 MED ORDER — FENTANYL CITRATE PF 50 MCG/ML IJ SOSY
25.0000 ug | PREFILLED_SYRINGE | INTRAMUSCULAR | Status: AC | PRN
  Administered 2023-04-10 (×2): 50 ug via INTRAVENOUS
  Filled 2023-04-10 (×2): qty 1

## 2023-04-10 MED ORDER — VANCOMYCIN HCL IN DEXTROSE 1-5 GM/200ML-% IV SOLN
1000.0000 mg | INTRAVENOUS | Status: DC
  Administered 2023-04-12: 1000 mg via INTRAVENOUS
  Filled 2023-04-10: qty 200

## 2023-04-10 MED ORDER — FENTANYL CITRATE PF 50 MCG/ML IJ SOSY
50.0000 ug | PREFILLED_SYRINGE | INTRAMUSCULAR | Status: DC | PRN
  Administered 2023-04-10 – 2023-04-14 (×21): 50 ug via INTRAVENOUS
  Filled 2023-04-10 (×23): qty 1

## 2023-04-10 MED ORDER — NALOXONE HCL 0.4 MG/ML IJ SOLN: 0.4000 mg | INTRAMUSCULAR | Status: DC | PRN

## 2023-04-10 NOTE — Progress Notes (Signed)
PROGRESS NOTE    [name]  ZOX:[id number] DOB: [birth year] DOA: 04/09/2023 PCP: [name], NP    Brief Narrative:   [name] is a 42 y.o. male with medical history significant for hypertension, insulin-dependent diabetes mellitus, ESRD on hemodialysis, and a left AKA who presented to the emergency department with fever, chills, and elevated heart rate. Patient has a foot wound at the plantar aspect of his right forefoot with increased drainage recently. In the ED, patient had temperature of 99.9 F, mildly tachycardic but normal BP. Chest x-ray was negative for acute findings and respiratory virus panel was negative.  Lactic acid was normal.  WBC was elevated to 17,400, total bilirubin is 2.1, alkaline phosphatase 197, AST 78, and ALT 86. Podiatry (Dr. [name] Bores) was consulted, blood cultures were collected, and the patient was given 500 mL of LR, vancomycin, and cefepime.  She was then admitted hospital for further evaluation and treatment.  Assessment/Plan    Right Diabetic foot ulcer.  Question infection Communicated with podiatry. Continue antibiotics with vancomycin and cefepime. Check ABI, Dr [name] Bores podiatry following. X-ray of the foot without acute findings.  Potential source of infection unlikely foot as per podiatry.  No need for surgical intervention.  MRSA bacteremia, on broad spectrum antibiotics. Will closely monitor.  Consult ID.    ESRD Monday Wednesday Friday. Had hemodialysis prior to coming to the hospital.  Will consult nephrology for maintenance hemodialysis while in the hospital.   Insulin-dependent DM  Hemoglobin A1c was 9.5% in February 2024.  Continue sliding scale insulin, Accu-Cheks, diabetic diet.  Elevated LFTs  Alk phos 197, AST 78, ALT 86, and t bili 2.1 on admission . Check viral hepatitis panel and HIV.  Right upper quadrant ultrasound with hepatic steatosis.  Will monitor LFTs.  Slightly trended down.   Diarrhea. GI pathogen panel and c diff  ordered.  Mild abdominal discomfort.  Might need further imaging if unimproved.   Thrombocytopenia   Platelets 111,000 on admission. Appears chronic, no bleeding, will monitor       DVT prophylaxis: SCDs Start: 04/09/23 2237   Code Status:     Code Status: Full Code  Disposition: Home likely in 2 to 3 days.  Status is: Inpatient  Remains inpatient appropriate because: MRSA bacteremia, IV antibiotic, follow-up blood cultures, ID consultation   Family Communication: None  Consultants:  Infectious disease Podiatry  Procedures:  None  Antimicrobials:  Cefepime, vancomycin, Flagyl  Anti-infectives (From admission, onward)    Start     Dose/Rate Route Frequency Ordered Stop   04/10/23 2000  ceFEPIme (MAXIPIME) 1 g in sodium chloride 0.9 % 100 mL IVPB        1 g 200 mL/hr over 30 Minutes Intravenous Every 24 hours 04/09/23 2244 04/16/23 1959   04/09/23 2300  vancomycin (VANCOREADY) IVPB 750 mg/150 mL        750 mg 150 mL/hr over 60 Minutes Intravenous  Once 04/09/23 2246 04/10/23 0306   04/09/23 2245  metroNIDAZOLE (FLAGYL) IVPB 500 mg        500 mg 100 mL/hr over 60 Minutes Intravenous Every 12 hours 04/09/23 2237 04/16/23 2244   04/09/23 2244  vancomycin variable dose per unstable renal function (pharmacist dosing)         Does not apply See admin instructions 04/09/23 2244     04/09/23 1930  vancomycin (VANCOCIN) IVPB 1000 mg/200 mL premix        1,000 mg 200 mL/hr over 60 Minutes Intravenous  Once 04/09/23 1922 04/09/23 2222   04/09/23 1930  ceFEPIme (MAXIPIME) 1 g in sodium chloride 0.9 % 100 mL IVPB        1 g 200 mL/hr over 30 Minutes Intravenous  Once 04/09/23 1922 04/09/23 2324       Subjective: Today, patient was seen and examined at bedside.  Complains of mild  abdominal pain.  Has had loose bowel movements.  Asking for fentanyl.  Denies any nausea, shortness of breath chest pain or dyspnea.  Objective: Vitals:   04/10/23 0930 04/10/23 1030 04/10/23 1050  04/10/23 1125  BP: 125/74 128/82    Pulse: (!) 103 96 97   Resp: 15 16    Temp:    98.9 F (37.2 C)  TempSrc:      SpO2: 97% 97% 96%   Weight:      Height:        Intake/Output Summary (Last 24 hours) at 04/10/2023 1136 Last data filed at 04/09/2023 2222 Gross per 24 hour  Intake 240 ml  Output --  Net 240 ml   Filed Weights   04/09/23 1706  Weight: 83.9 kg    Physical Examination: Body mass index is 28.98 kg/m.   General:  Average built, not in obvious distress HENT:   No scleral pallor or icterus noted. Oral mucosa is moist.  Chest:.  Diminished breath sounds bilaterally. Right chest wall hemodialysis catheter in place without any induration or purulence. CVS: S1 &S2 heard. No murmur.  Regular rate and rhythm. Abdomen: Soft, nontender, nondistended.  Bowel sounds are heard.   Extremities: Left above-knee amputation.  Right foot ulcer, left upper extremity hemodialysis access. Psych: Alert, awake and oriented, normal mood CNS:  No cranial nerve deficits.  Power equal in all extremities.   Skin: Warm and dry.  Right foot ulcer.  Data Reviewed:   CBC: Recent Labs  Lab 04/09/23 1815 04/10/23 0246  WBC 17.4* 18.2*  NEUTROABS 15.6*  --   HGB 11.0* 10.7*  HCT 33.8* 34.0*  MCV 89.2 93.4  PLT 111* 106*    Basic Metabolic Panel: Recent Labs  Lab 04/09/23 1815 04/10/23 0246  NA 134* 132*  K 3.8 3.7  CL 97* 97*  CO2 20* 16*  GLUCOSE 192* 132*  BUN 30* 37*  CREATININE 6.96* 7.78*  CALCIUM 8.9 8.5*  MG  --  2.3  PHOS  --  5.3*    Liver Function Tests: Recent Labs  Lab 04/09/23 1815 04/10/23 0246  AST 78* 56*  ALT 86* 67*  ALKPHOS 197* 168*  BILITOT 2.1* 1.6*  PROT 7.4 6.5  ALBUMIN 3.2* 2.9*     Radiology Studies: DG Foot Complete Right  Result Date: 04/10/2023 CLINICAL DATA:  Right foot infection. EXAM: RIGHT FOOT COMPLETE - 3+ VIEW COMPARISON:  Right foot x-rays dated December 12, 2019. FINDINGS: No bony destruction or periosteal reaction.  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 No acute fracture or dislocation. Joint spaces are preserved. Bone mineralization is normal. IMPRESSION: 1. No acute osseous abnormality. Electronically Signed   By: Obie Dredge M.D.   On: 04/10/2023 09:13   US Abdomen Limited RUQ (LIVER/GB)  Result Date: 04/10/2023 CLINICAL DATA:  Elevated liver function tests. EXAM: ULTRASOUND ABDOMEN LIMITED RIGHT UPPER QUADRANT COMPARISON:  None Available. FINDINGS: Gallbladder: No gallstones or wall thickening visualized (2.4 mm). No sonographic Murphy sign noted by sonographer. Common bile duct: Diameter: 3.6 mm Liver: No focal lesion identified. Diffusely increased echogenicity of the liver parenchyma is noted. Portal  vein is patent on color Doppler imaging with normal direction of blood flow towards the liver. Other: None. IMPRESSION: Hepatic steatosis without focal liver lesions. Electronically Signed   By: Aram Candela M.D.   On: 04/10/2023 01:40   DG Chest Portable 1 View  Result Date: 04/09/2023 CLINICAL DATA:  Fever and cough. EXAM: PORTABLE CHEST 1 VIEW COMPARISON:  April 17, 2022 FINDINGS: Large-bore right central venous catheter stable. Cardiomediastinal silhouette is normal. Mediastinal contours appear intact. There is no evidence of focal airspace consolidation, pleural effusion or pneumothorax. Osseous structures are without acute abnormality. Soft tissues are grossly normal. IMPRESSION: No active disease. Electronically Signed   By: Ted Mcalpine M.D.   On: 04/09/2023 18:41      LOS: 1 day    Joycelyn Das, MD [facility] Available via Epic secure chat 7am-7pm After these hours, please refer to coverage provider listed on [email or web address] 04/10/2023, 11:36 AM

## 2023-04-10 NOTE — Progress Notes (Signed)
PHARMACY - PHYSICIAN COMMUNICATION CRITICAL VALUE ALERT - BLOOD CULTURE IDENTIFICATION (BCID)  [name] is an 42 y.o. male who presented to [facility] on 04/09/2023 with a chief complaint of diabetic foot infection  Assessment:  MRSA bacteremia. Here for diabetic foot infection-possible polymicrobial.   Name of physician (or Provider) Contacted: Dr. Tyson Babinski  Current antibiotics: Vancomycin, cefepime, flagyl  Changes to prescribed antibiotics recommended:  Patient is on recommended antibiotics - No changes needed  Results for orders placed or performed during the hospital encounter of 04/09/23  Blood Culture ID Panel (Reflexed) (Collected: 04/09/2023  6:15 PM)  Result Value Ref Range   Enterococcus faecalis NOT DETECTED NOT DETECTED   Enterococcus Faecium NOT DETECTED NOT DETECTED   Listeria monocytogenes NOT DETECTED NOT DETECTED   Staphylococcus species DETECTED (A) NOT DETECTED   Staphylococcus aureus (BCID) DETECTED (A) NOT DETECTED   Staphylococcus epidermidis NOT DETECTED NOT DETECTED   Staphylococcus lugdunensis NOT DETECTED NOT DETECTED   Streptococcus species NOT DETECTED NOT DETECTED   Streptococcus agalactiae NOT DETECTED NOT DETECTED   Streptococcus pneumoniae NOT DETECTED NOT DETECTED   Streptococcus pyogenes NOT DETECTED NOT DETECTED   A.calcoaceticus-baumannii NOT DETECTED NOT DETECTED   Bacteroides fragilis NOT DETECTED NOT DETECTED   Enterobacterales NOT DETECTED NOT DETECTED   Enterobacter cloacae complex NOT DETECTED NOT DETECTED   Escherichia coli NOT DETECTED NOT DETECTED   Klebsiella aerogenes NOT DETECTED NOT DETECTED   Klebsiella oxytoca NOT DETECTED NOT DETECTED   Klebsiella pneumoniae NOT DETECTED NOT DETECTED   Proteus species NOT DETECTED NOT DETECTED   Salmonella species NOT DETECTED NOT DETECTED   Serratia marcescens NOT DETECTED NOT DETECTED   Haemophilus influenzae NOT DETECTED NOT DETECTED   Neisseria meningitidis NOT DETECTED NOT DETECTED    Pseudomonas aeruginosa NOT DETECTED NOT DETECTED   Stenotrophomonas maltophilia NOT DETECTED NOT DETECTED   Candida albicans NOT DETECTED NOT DETECTED   Candida auris NOT DETECTED NOT DETECTED   Candida glabrata NOT DETECTED NOT DETECTED   Candida krusei NOT DETECTED NOT DETECTED   Candida parapsilosis NOT DETECTED NOT DETECTED   Candida tropicalis NOT DETECTED NOT DETECTED   Cryptococcus neoformans/gattii NOT DETECTED NOT DETECTED   Meth resistant mecA/C and MREJ DETECTED (A) NOT DETECTED    Fayne Norrie 04/10/2023  9:03 AM

## 2023-04-10 NOTE — ED Notes (Signed)
Pt refused to get connected back to monitor after using restroom

## 2023-04-10 NOTE — Plan of Care (Signed)
  Problem: Nutritional: Goal: Maintenance of adequate nutrition will improve Outcome: Progressing   Problem: Skin Integrity: Goal: Risk for impaired skin integrity will decrease Outcome: Progressing   

## 2023-04-10 NOTE — Hospital Course (Addendum)
[name] is a 42 y.o. male with medical history significant for hypertension, insulin-dependent diabetes mellitus, ESRD on hemodialysis, and a left AKA who presented to the emergency department with fever, chills, and elevated heart rate. Patient has a foot wound at the plantar aspect of his right forefoot with increased drainage recently. In the ED, patient had temperature of 99.9 F, mildly tachycardic but normal BP. Chest x-ray was negative for acute findings and respiratory virus panel was negative.  Lactic acid was normal.  WBC was elevated to 17,400, total bilirubin is 2.1, alkaline phosphatase 197, AST 78, and ALT 86. Podiatry (Dr. [name] Bores) was consulted, blood cultures were collected, and the patient was given 500 mL of LR, vancomycin, and cefepime.  She was then admitted hospital for further evaluation and treatment.  Assessment/Plan    Right Diabetic foot infection  Communicated with podiatry. Continue antibiotics with vancomycin and cefepime., check ABI, Dr [name] Bores podiatry to follow.  Check x-ray of the foot without acute findings.   MRSA bacteremia, on broad spectrum antibiotics. Will closely monitor.   ESRD Monday Wednesday Friday. Had hemodialysis prior to coming to the hospital.  Will consult nephrology for maintenance hemodialysis while in the hospital.   Insulin-dependent DM  Hemoglobin A1c was 9.5% in February 2024.  Continue sliding scale insulin, Accu-Cheks, diabetic diet.  Elevated LFTs  Alk phos 197, AST 78, ALT 86, and t bili 2.1 on admission   Check viral hepatitis panel and HIV.  Right upper quadrant ultrasound with hepatic steatosis.  Will monitor LFTs.  Slightly trended down.   Diarrhea. GI pathogen panel and c diff ordered.    Thrombocytopenia   Platelets 111,000 on admission. Appears chronic, no bleeding, will monitor

## 2023-04-10 NOTE — Consult Note (Signed)
WOC Nurse Consult Note: Reason for Consult:WOC Nursing is consulted simultaneously with Podiatric Medicine for right foot wound and Dr. [name] Bores has seen this am. No wound present. Podiatry has signed off. Wound type:Neuropathic Pressure Injury POA:N/A Measurement:N/A Wound ZOX:WRUEAV. See photo provided by Dr,. Johannesen. Drainage (amount, consistency, odor) None Periwound: with discoloration, deeply hued tissues Dressing procedure/placement/frequency: No dressing indicated. Patient to follow up in the outpatient Podiatry office as directed.   Heels should be floated while in bed to prevent PI. Protective shoe wear to be worn while ambulating to prevent injury.  Questions about the right foot should be asked of Podiatric medicine provider.  WOC nursing team will not follow, but will remain available to this patient, the nursing and medical teams.  Please re-consult if needed.  Thank you for inviting [place] to participate in this patient's Plan of Care.  Ladona Mow, MSN, RN, CNS, GNP, Leda Min, [employer], [employer] phone:  [phone]

## 2023-04-10 NOTE — ED Notes (Signed)
ED TO INPATIENT HANDOFF REPORT  ED Nurse Name and Phone #: Morrie Sheldon [phone]  S Name/Age/Gender [name] 42 y.o. male Room/Bed: 005C/005C  Code Status   Code Status: Full Code  Home/SNF/Other Home Patient oriented to: self, place, time, and situation Is this baseline? Yes   Triage Complete: Triage complete  Chief Complaint Diabetic foot infection ([name]) [Y78.295, L08.9]  Triage Note No notes on file   Allergies No Known Allergies  Level of Care/Admitting Diagnosis ED Disposition     ED Disposition  Admit   Condition  --   Comment  Hospital Area: [facility] [[id number]]  Level of Care: Progressive [102]  Admit to Progressive based on following criteria: MULTISYSTEM THREATS such as stable sepsis, metabolic/electrolyte imbalance with or without encephalopathy that is responding to early treatment.  May admit patient to [facility] or [facility] if equivalent level of care is available:: No  Covid Evaluation: Confirmed COVID Negative  Diagnosis: Diabetic foot infection [facility]) [[id number]]  Admitting Physician: Briscoe Deutscher [[id number]]  Attending Physician: Briscoe Deutscher [[id number]]  Certification:: I certify this patient will need inpatient services for at least 2 midnights  Estimated Length of Stay: 3          B Medical/Surgery History Past Medical History:  Diagnosis Date   Anemia    Cellulitis and abscess of toe of left foot    Diabetes mellitus without complication ([name])    Diabetic gastroparesis ([name])    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 Filetted toe flap metatarsal resection. Debridement Plantar Foot wound;  Surgeon: Park Liter, DPM;  Location: [facility] OR;  Service: Podiatry;  Laterality: Right;   AMPUTATION Left 08/20/2018   Procedure: FIFTH METATARSAL BONE BIOPSY;  Surgeon: Park Liter, DPM;  [facility] OR;  Service: Podiatry;  Laterality: Left;   AMPUTATION Left 10/28/2018   Procedure: LEFT GREAT TOE AMPUTATION;  Surgeon: Park Liter, DPM;  Location[facility] OR;  Service: Podiatry;  Laterality: Left;   AMPUTATION Left 09/16/2020   Procedure: AMPUTATION BELOW KNEE;  Surgeon: Larina Earthly, MD;  Location: [facility] OR;  Service: Vascular;  Laterality: Left;   AMPUTATION Left 10/15/2020   Procedure: AMPUTATION ABOVE KNEE;  Surgeon: Sherren Kerns, MD;  Location: [facility] OR;  Service: Vascular;  Laterality: Left;   APPLICATION OF WOUND VAC  02/02/2018   Procedure: APPLICATION OF WOUND VAC  Right Foot;  Surgeon: Park Liter, DPM;  Location[facility] OR;  Service: Podiatry;;   APPLICATION OF WOUND VAC Left 10/28/2018   Procedure: APPLICATION OF WOUND VAC LEFT TOE;  Surgeon: Park Liter, DPM;  [facility] OR;  Service: Podiatry;  Laterality: Left;   APPLICATION OF WOUND VAC Left 11/01/2018   Procedure: APPLICATION OF WOUND VAC;  Surgeon: Park Liter, DPM;  [facility] OR;  Service: Podiatry;  Laterality: Left;   AV FISTULA PLACEMENT     left arm.   AV FISTULA PLACEMENT Right 12/22/2016   Procedure: INSERTION OF ARTERIOVENOUS (AV) GORE-TEX GRAFT ARM;  Surgeon: Sherren Kerns, MD;  Location: [facility] OR;  Service: Vascular;  Laterality: Right;   AV FISTULA PLACEMENT  Left 05/26/2018   Procedure: INSERTION OF  ARTERIOVENOUS (AV) GORE-TEX GRAFT LEFT ARM;  Surgeon: Nada Libman, MD;  Location[facility] OR;  Service: Vascular;  Laterality: Left;   EYE SURGERY     I & D EXTREMITY Right 10/31/2017   Procedure: IRRIGATION AND DEBRIDEMENT RIGHT FOOT;  Surgeon: Park Liter, DPM;  [facility] OR;  Service: Podiatry;  Laterality: Right;   I & D EXTREMITY Left 08/20/2018   Procedure: IRRIGATION AND DEBRIDEMENT EXTREMITY WITH  SECONDARY WOUND CLOSUREAND APPLICATION OF WOUND VAC LEFT FOOT;  Surgeon: Park Liter, DPM;  [facility] OR;  Service: Podiatry;  Laterality: Left;   I & D EXTREMITY Left 10/20/2018   Procedure: IRRIGATION AND DEBRIDEMENT LEFT FOOT  DEBRIDEMENT LATERAL FOOT WOUND;  Surgeon: Park Liter, DPM;  [facility];  Service: Podiatry;  Laterality: Left;   I & D EXTREMITY Left 10/28/2018   Procedure: IRRIGATION AND DEBRIDEMENT LEFT TOE;  Surgeon: Park Liter, DPM;  [facility];  Service: Podiatry;  Laterality: Left;   I & D EXTREMITY Left 09/14/2020   Procedure: IRRIGATION AND DEBRIDEMENT WRIST;  Surgeon: Betha Loa, MD;  Location[facility] OR;  Service: Orthopedics;  Laterality: Left;   INSERTION OF DIALYSIS CATHETER     Right subclavian   IR AV DIALY SHUNT INTRO NEEDLE/INTRACATH INITIAL W/PTA/IMG RIGHT Right 02/05/2018   IR FLUORO GUIDE CV LINE RIGHT  01/31/2020   IR FLUORO GUIDE CV LINE RIGHT  01/30/2022   IR PTA VENOUS EXCEPT DIALYSIS CIRCUIT  01/30/2022   IR THROMBECTOMY AV FISTULA W/THROMBOLYSIS/PTA INC/SHUNT/IMG LEFT Left 08/24/2018   IR THROMBECTOMY AV FISTULA W/THROMBOLYSIS/PTA INC/SHUNT/IMG LEFT Left 01/06/2019   IR US GUIDE VASC ACCESS LEFT  08/24/2018   IR US GUIDE VASC ACCESS RIGHT  02/05/2018   IR US GUIDE VASC ACCESS RIGHT  01/31/2020   IR VENOCAVAGRAM SVC  01/30/2022   IRRIGATION AND DEBRIDEMENT FOOT Right 10/23/2018   Procedure: Irrigation and Debridement to tendon, Left Foot;  Surgeon: Park Liter, DPM;  [facility] OR;  Service: Podiatry;  Laterality: Right;   IRRIGATION AND DEBRIDEMENT FOOT Left 11/01/2018   Procedure: IRRIGATION AND DEBRIDEMENT PARTIAL WOUND CLOSURE LOCAL TISSUE TRANSFER AND FLAP ROTATION, LEFT FOOT;  Surgeon: Park Liter, DPM;  [facility] OR;  Service: Podiatry;  Laterality: Left;   TRANSMETATARSAL AMPUTATION N/A 08/18/2018   Procedure: IRRIGATION AND DEBRIDEMENT OF LEFT 5TH TOE AND TRANSMETATARSAL, WITH PARTICAL LEFT 5TH TOE AND METATARSAL  AMPUTATION, BONE BIOPSY, WOUND VAC APPLICATION.;  Surgeon: Park Liter, DPM;  [facility] OR;  Service: Podiatry;  Laterality: N/A;   UPPER EXTREMITY VENOGRAPHY N/A 11/16/2016   Procedure: Upper Extremity Venography - Right Central;  Surgeon: Sherren Kerns, MD;  Location: [facility] INVASIVE CV LAB;  Service: Cardiovascular;  Laterality: N/A;   UPPER EXTREMITY VENOGRAPHY N/A 05/25/2018   Procedure: UPPER EXTREMITY VENOGRAPHY - Bilateral;  Surgeon: Cephus Shelling, MD;  [facility] INVASIVE CV LAB;  Service: Cardiovascular;  Laterality: N/A;     A IV Location/Drains/Wounds Patient Lines/Drains/Airways Status     Active Line/Drains/Airways     Name Placement date Placement time Site Days   Peripheral IV 01/21/22 22 G Anterior;Right;Upper Arm 01/21/22  1033  Arm  444   Peripheral IV 04/17/22 20 G 1.88" Anterior;Right Forearm 04/17/22  1554  Forearm  358   Peripheral IV 04/09/23 18 G Anterior;Left;Upper Arm 04/09/23  --  Arm  1   Fistula / Graft Left Upper arm Arteriovenous vein graft  05/26/18  0828  Upper arm  1780   Hemodialysis Catheter Right Subclavian Double lumen Permanent (Tunneled) 01/30/22  1423  Subclavian  435   Incision (Closed) 09/16/20 Leg Left 09/16/20  1413  -- 936   Incision (Closed) 10/15/20 Leg Right 10/15/20  1247  -- 907   Pressure Injury 10/14/20 Wrist Left;Anterior Stage 2 -  Partial thickness loss of dermis presenting as a shallow open injury with a red, pink wound bed without slough. 10/14/20  2100  -- 908   Wound / Incision (Open or Dehisced) 10/26/18 Diabetic ulcer Foot Left;Lateral;Anterior unstageable 10/26/18  2130  Foot  1627   Wound / Incision (Open or Dehisced) 04/24/19 Diabetic ulcer Left;Posterior 04/24/19  2200  --  1447   Wound / Incision (Open or Dehisced) 07/22/20 Diabetic ulcer Foot Right 07/22/20  0515  Foot  992   Wound / Incision (Open or Dehisced) 09/21/20 Incision - Open Arm Left;Lower;Medial;Anterior open incision boarded by sutures, with  exposed tendon 09/21/20  1000  Arm  931   Wound / Incision (Open or Dehisced) 10/14/20 Leg Left 10/14/20  --  Leg  908   Wound / Incision (Open or Dehisced) 05/21/21 Non-pressure wound Pretibial Right 05/21/21  0830  Pretibial  689            Intake/Output Last 24 hours  Intake/Output Summary (Last 24 hours) at 04/10/2023 1224 Last data filed at 04/09/2023 2222 Gross per 24 hour  Intake 240 ml  Output --  Net 240 ml    Labs/Imaging Results for orders placed or performed during the hospital encounter of 04/09/23 (from the past 48 hour(s))  Resp panel by RT-PCR (RSV, Flu A&B, Covid) Anterior Nasal Swab     Status: None   Collection Time: 04/09/23  5:36 PM   Specimen: Anterior Nasal Swab  Result Value Ref Range   SARS Coronavirus 2 by RT PCR NEGATIVE NEGATIVE   Influenza A by PCR NEGATIVE NEGATIVE   Influenza B by PCR NEGATIVE NEGATIVE    Comment: (NOTE) The Xpert Xpress SARS-CoV-2/FLU/RSV plus assay is intended as an aid in the diagnosis of influenza from Nasopharyngeal swab specimens and should not be used as a sole basis for treatment. Nasal washings and aspirates are unacceptable for Xpert Xpress SARS-CoV-2/FLU/RSV testing.  Fact Sheet for Patients: BloggerCourse.com  Fact Sheet for Healthcare Providers: SeriousBroker.it  This test is not yet approved or cleared by the [place] FDA and has been authorized for detection and/or diagnosis of SARS-CoV-2 by FDA under an Emergency Use Authorization (EUA). This EUA will remain in effect (meaning this test can be used) for the duration of the COVID-19 declaration under Section 564(b)(1) of the Act, 21 U.S.C. section 360bbb-3(b)(1), unless the authorization is terminated or revoked.     Resp Syncytial Virus by PCR NEGATIVE NEGATIVE    Comment: (NOTE) Fact Sheet for Patients: BloggerCourse.com  Fact Sheet for Healthcare  Providers: SeriousBroker.it  This test is not yet approved or cleared by the [place] FDA and has been authorized for detection and/or diagnosis of SARS-CoV-2 by FDA under an Emergency Use Authorization (EUA). This EUA will remain in effect (meaning this test can be used) for the duration of the COVID-19 declaration under Section 564(b)(1) of the Act, 21 U.S.C. section 360bbb-3(b)(1), unless the authorization is terminated or revoked.  Performed at [facility] Lab, [address], [place], [name] [address]   CBC with Differential     Status: Abnormal   Collection Time: 04/09/23  6:15 PM  Result Value Ref Range   WBC 17.4 (H) 4.0 - 10.5 K/uL   RBC 3.79 (L) 4.22 - 5.81 MIL/uL   Hemoglobin 11.0 (L) 13.0 - 17.0 g/dL   HCT 72.5 (L) 36.6 - 44.0 %   MCV 89.2 80.0 - 100.0 fL   MCH 29.0 26.0 - 34.0 pg   MCHC 32.5 30.0 - 36.0 g/dL   RDW 34.7 42.5 - 95.6 %   Platelets 111 (L) 150 - 400 K/uL   nRBC 0.0 0.0 - 0.2 %   Neutrophils Relative % 89 %   Neutro Abs 15.6 (H) 1.7 - 7.7 K/uL   Lymphocytes Relative 3 %   Lymphs Abs 0.5 (L) 0.7 - 4.0 K/uL   Monocytes Relative 6 %   Monocytes Absolute 1.0 0.1 - 1.0 K/uL   Eosinophils Relative 1 %   Eosinophils Absolute 0.1 0.0 - 0.5 K/uL   Basophils Relative 0 %   Basophils Absolute 0.0 0.0 - 0.1 K/uL   Immature Granulocytes 1 %   Abs Immature Granulocytes 0.16 (H) 0.00 - 0.07 K/uL    Comment: Performed at [facility] Lab, [address] [place], [name] [address]  Comprehensive metabolic panel     Status: Abnormal   Collection Time: 04/09/23  6:15 PM  Result Value Ref Range   Sodium 134 (L) 135 - 145 mmol/L   Potassium 3.8 3.5 - 5.1 mmol/L   Chloride 97 (L) 98 - 111 mmol/L   CO2 20 (L) 22 - 32 mmol/L   Glucose, Bld 192 (H) 70 - 99 mg/dL    Comment: Glucose reference range applies only to samples taken after fasting for at least 8 hours.   BUN 30 (H) 6 - 20 mg/dL   Creatinine, Ser 4.33 (H) 0.61 - 1.24  mg/dL   Calcium 8.9 8.9 - 29.5 mg/dL   Total Protein 7.4 6.5 - 8.1 g/dL   Albumin 3.2 (L) 3.5 - 5.0 g/dL   AST 78 (H) 15 - 41 U/L   ALT 86 (H) 0 - 44 U/L   Alkaline Phosphatase 197 (H) 38 - 126 U/L   Total Bilirubin 2.1 (H) 0.3 - 1.2 mg/dL   GFR, Estimated 9 (L) >60 mL/min    Comment: (NOTE) Calculated using the CKD-EPI Creatinine Equation (2021)    Anion gap 17 (H) 5 - 15    Comment: Performed at [facility], [address], [place], [name] [address]  Lactic acid, plasma     Status: None   Collection Time: 04/09/23  6:15 PM  Result Value Ref Range   Lactic Acid, Venous 1.8 0.5 - 1.9 mmol/L    Comment: Performed at [facility], [address] [place], [name] [address]  Blood culture (routine x 2)     Status: None (Preliminary result)   Collection Time: 04/09/23  6:15 PM   Specimen: BLOOD LEFT HAND  Result Value Ref Range   Specimen Description BLOOD LEFT HAND    Special Requests      BOTTLES DRAWN AEROBIC ONLY Blood Culture adequate volume   Culture  Setup Time      GRAM POSITIVE COCCI AEROBIC BOTTLE ONLY CRITICAL RESULT CALLED TO, READ BACK BY AND VERIFIED WITH: [facility] JESSICA MILLEN [phone] AT 0841 BY EC Performed at [facility] Lab, [address], [place], [name] [address]    Culture GRAM POSITIVE COCCI    Report Status PENDING   Blood culture (routine x 2)  Status: None (Preliminary result)   Collection Time: 04/09/23  6:15 PM   Specimen: BLOOD LEFT ARM  Result Value Ref Range   Specimen Description BLOOD LEFT ARM    Special Requests      BOTTLES DRAWN AEROBIC ONLY Blood Culture results may not be optimal due to an excessive volume of blood received in culture bottles   Culture  Setup Time      GRAM POSITIVE COCCI AEROBIC BOTTLE ONLY CRITICAL VALUE NOTED.  VALUE IS CONSISTENT WITH PREVIOUSLY REPORTED AND CALLED VALUE.    Culture      NO GROWTH < 24 HOURS Performed at [facility] Lab, [address], [place], [name] [address]     Report Status PENDING   Blood Culture ID Panel (Reflexed)     Status: Abnormal   Collection Time: 04/09/23  6:15 PM  Result Value Ref Range   Enterococcus faecalis NOT DETECTED NOT DETECTED   Enterococcus Faecium NOT DETECTED NOT DETECTED   Listeria monocytogenes NOT DETECTED NOT DETECTED   Staphylococcus species DETECTED (A) NOT DETECTED    Comment: CRITICAL RESULT CALLED TO, READ BACK BY AND VERIFIED WITH: [facility] JESSICA MILLEN [phone] AT 0841 BY EC    Staphylococcus aureus (BCID) DETECTED (A) NOT DETECTED    Comment: Methicillin (oxacillin)-resistant Staphylococcus aureus (MRSA). MRSA is predictably resistant to beta-lactam antibiotics (except ceftaroline). Preferred therapy is vancomycin unless clinically contraindicated. Patient requires contact precautions if  hospitalized. CRITICAL RESULT CALLED TO, READ BACK BY AND VERIFIED WITH: [facility] JESSICA MILLEN AT [phone] AT 0841 BY EC    Staphylococcus epidermidis NOT DETECTED NOT DETECTED   Staphylococcus lugdunensis NOT DETECTED NOT DETECTED   Streptococcus species NOT DETECTED NOT DETECTED   Streptococcus agalactiae NOT DETECTED NOT DETECTED   Streptococcus pneumoniae NOT DETECTED NOT DETECTED   Streptococcus pyogenes NOT DETECTED NOT DETECTED   A.calcoaceticus-baumannii NOT DETECTED NOT DETECTED   Bacteroides fragilis NOT DETECTED NOT DETECTED   Enterobacterales NOT DETECTED NOT DETECTED   Enterobacter cloacae complex NOT DETECTED NOT DETECTED   Escherichia coli NOT DETECTED NOT DETECTED   Klebsiella aerogenes NOT DETECTED NOT DETECTED   Klebsiella oxytoca NOT DETECTED NOT DETECTED   Klebsiella pneumoniae NOT DETECTED NOT DETECTED   Proteus species NOT DETECTED NOT DETECTED   Salmonella species NOT DETECTED NOT DETECTED   Serratia marcescens NOT DETECTED NOT DETECTED   Haemophilus influenzae NOT DETECTED NOT DETECTED   Neisseria meningitidis NOT DETECTED NOT DETECTED   Pseudomonas aeruginosa NOT DETECTED NOT DETECTED    Stenotrophomonas maltophilia NOT DETECTED NOT DETECTED   Candida albicans NOT DETECTED NOT DETECTED   Candida auris NOT DETECTED NOT DETECTED   Candida glabrata NOT DETECTED NOT DETECTED   Candida krusei NOT DETECTED NOT DETECTED   Candida parapsilosis NOT DETECTED NOT DETECTED   Candida tropicalis NOT DETECTED NOT DETECTED   Cryptococcus neoformans/gattii NOT DETECTED NOT DETECTED   Meth resistant mecA/C and MREJ DETECTED (A) NOT DETECTED    Comment: CRITICAL RESULT CALLED TO, READ BACK BY AND VERIFIED WITH: PHARMD JESSICA MILLEN [phone] AT 0841 BY EC Performed at [facility] Lab, [address] [place], [name] [address]   Beta-hydroxybutyric acid     Status: Abnormal   Collection Time: 04/09/23  7:44 PM  Result Value Ref Range   Beta-Hydroxybutyric Acid 1.67 (H) 0.05 - 0.27 mmol/L    Comment: Performed at [facility] Lab, [address] [place], [name] [address]  CBG monitoring, ED     Status: Abnormal  Collection Time: 04/09/23 10:04 PM  Result Value Ref Range   Glucose-Capillary 223 (H) 70 - 99 mg/dL    Comment: Glucose reference range applies only to samples taken after fasting for at least 8 hours.  CBG monitoring, ED     Status: Abnormal   Collection Time: 04/10/23  1:36 AM  Result Value Ref Range   Glucose-Capillary 169 (H) 70 - 99 mg/dL    Comment: Glucose reference range applies only to samples taken after fasting for at least 8 hours.  Sedimentation rate     Status: Abnormal   Collection Time: 04/10/23  2:46 AM  Result Value Ref Range   Sed Rate 46 (H) 0 - 16 mm/hr    Comment: Performed at [facility], [address] [place], [name] [address]  C-reactive protein     Status: Abnormal   Collection Time: 04/10/23  2:46 AM  Result Value Ref Range   CRP 22.3 (H) <1.0 mg/dL    Comment: Performed at [facility] Lab, [address], [place], [name] [address]  Prealbumin     Status: Abnormal   Collection Time: 04/10/23  2:46 AM  Result Value Ref  Range   Prealbumin 12 (L) 18 - 38 mg/dL    Comment: Performed at [facility], [address] [place], [name] [address]  HIV Antibody (routine testing w rflx)     Status: None   Collection Time: 04/10/23  2:46 AM  Result Value Ref Range   HIV Screen 4th Generation wRfx Non Reactive Non Reactive    Comment: Performed at [facility], [address] [place], [name] [address]  Comprehensive metabolic panel     Status: Abnormal   Collection Time: 04/10/23  2:46 AM  Result Value Ref Range   Sodium 132 (L) 135 - 145 mmol/L   Potassium 3.7 3.5 - 5.1 mmol/L   Chloride 97 (L) 98 - 111 mmol/L   CO2 16 (L) 22 - 32 mmol/L   Glucose, Bld 132 (H) 70 - 99 mg/dL    Comment: Glucose reference range applies only to samples taken after fasting for at least 8 hours.   BUN 37 (H) 6 - 20 mg/dL   Creatinine, Ser 2.95 (H) 0.61 - 1.24 mg/dL   Calcium 8.5 (L) 8.9 - 10.3 mg/dL   Total Protein 6.5 6.5 - 8.1 g/dL   Albumin 2.9 (L) 3.5 - 5.0 g/dL   AST 56 (H) 15 - 41 U/L   ALT 67 (H) 0 - 44 U/L   Alkaline Phosphatase 168 (H) 38 - 126 U/L   Total Bilirubin 1.6 (H) 0.3 - 1.2 mg/dL   GFR, Estimated 8 (L) >60 mL/min    Comment: (NOTE) Calculated using the CKD-EPI Creatinine Equation (2021)    Anion gap 19 (H) 5 - 15    Comment: ELECTROLYTES REPEATED TO VERIFY Performed at [facility] Lab, [address] [place], [name] [address]   Phosphorus     Status: Abnormal   Collection Time: 04/10/23  2:46 AM  Result Value Ref Range   Phosphorus 5.3 (H) 2.5 - 4.6 mg/dL    Comment: Performed at [facility] Lab, [address] [place], [name] [address]  Magnesium     Status: None   Collection Time: 04/10/23  2:46 AM  Result Value Ref Range   Magnesium 2.3 1.7 - 2.4 mg/dL    Comment: Performed at [facility], [address], [place],  [place] [address]  CBC     Status: Abnormal   Collection Time: 04/10/23  2:46 AM  Result Value Ref Range   WBC 18.2 (H) 4.0 - 10.5 K/uL   RBC 3.64 (L)  4.22 - 5.81 MIL/uL   Hemoglobin 10.7 (L) 13.0 - 17.0 g/dL   HCT 60.4 (L) 54.0 - 98.1 %   MCV 93.4 80.0 - 100.0 fL   MCH 29.4 26.0 - 34.0 pg   MCHC 31.5 30.0 - 36.0 g/dL   RDW 19.1 47.8 - 29.5 %   Platelets 106 (L) 150 - 400 K/uL   nRBC 0.0 0.0 - 0.2 %    Comment: Performed at [facility] Lab, [address] [place], [name] [address]  Hepatitis panel, acute     Status: None   Collection Time: 04/10/23  2:46 AM  Result Value Ref Range   Hepatitis B Surface Ag NON REACTIVE NON REACTIVE   HCV Ab NON REACTIVE NON REACTIVE    Comment: (NOTE) Nonreactive HCV antibody screen is consistent with no HCV infections,  unless recent infection is suspected or other evidence exists to indicate HCV infection.     Hep A IgM NON REACTIVE NON REACTIVE   Hep B C IgM NON REACTIVE NON REACTIVE    Comment: Performed at [facility] Lab, [address], [place], [name] [address]  CBG monitoring, ED     Status: Abnormal   Collection Time: 04/10/23  4:05 AM  Result Value Ref Range   Glucose-Capillary 102 (H) 70 - 99 mg/dL    Comment: Glucose reference range applies only to samples taken after fasting for at least 8 hours.  C Difficile Quick Screen w PCR reflex     Status: None   Collection Time: 04/10/23  6:09 AM   Specimen: STOOL  Result Value Ref Range   C Diff antigen NEGATIVE NEGATIVE   C Diff toxin NEGATIVE NEGATIVE   C Diff interpretation No C. difficile detected.     Comment: Performed at [facility] Lab, [address], [place], [name] [address]  CBG monitoring, ED     Status: Abnormal   Collection Time: 04/10/23  7:26 AM  Result Value Ref Range   Glucose-Capillary 226 (H) 70 - 99 mg/dL    Comment: Glucose reference range applies only to samples taken after fasting for at least 8 hours.  CBG monitoring, ED     Status: Abnormal   Collection Time: 04/10/23 12:11 PM  Result Value Ref Range   Glucose-Capillary 175 (H) 70 - 99 mg/dL    Comment: Glucose reference range applies only  to samples taken after fasting for at least 8 hours.   VAS [place] ABI WITH/WO TBI  Result Date: 04/10/2023  LOWER EXTREMITY DOPPLER STUDY Patient Name:  [name]  Date of Exam:   04/10/2023 Medical Rec #: [id number]      Accession #:    [id number] Date of Birth: 1980-10-21     Patient Gender: M Patient Age:   56 years Exam [facility] Procedure:      VAS [place] ABI WITH/WO TBI Referring Phys: TIMOTHY OPYD --------------------------------------------------------------------------------  Indications: Ulceration. High Risk Factors: Hypertension, Diabetes. 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 Comparison Study: Prior ABI done 05/22/21 Performing Technologist: Sherren Kerns RVS  Examination Guidelines: A complete evaluation includes at minimum, Doppler waveform signals and systolic blood pressure reading at the level of bilateral brachial, anterior tibial, and posterior tibial  arteries, when vessel segments are accessible. Bilateral testing is considered an integral part of a complete examination. Photoelectric Plethysmograph (PPG) waveforms and toe systolic pressure readings are included as required and additional duplex testing as needed. Limited examinations for reoccurring indications may be performed as noted.  ABI Findings: +---------+------------------+-----+---------+--------+ Right    Rt Pressure (mmHg)IndexWaveform Comment  +---------+------------------+-----+---------+--------+ Brachial 170                    triphasic         +---------+------------------+-----+---------+--------+ PTA      253               1.49 triphasic         +---------+------------------+-----+---------+--------+ DP       162               0.95 triphasic         +---------+------------------+-----+---------+--------+ Great Toe100               0.59                   +---------+------------------+-----+---------+--------+  +--------+------------------+-----+---------+-------+ Left    Lt Pressure (mmHg)IndexWaveform Comment +--------+------------------+-----+---------+-------+ [id number]                    triphasicAKA     +--------+------------------+-----+---------+-------+ +-------+-------------+-----------+----------------+------------+ ABI/TBIToday's ABI  Today's TBIPrevious ABI    Previous TBI +-------+-------------+-----------+----------------+------------+ Right  1.49/non comp0.59       non compressible0.45         +-------+-------------+-----------+----------------+------------+ Left   AKA          AKA        AKA             AKA          +-------+-------------+-----------+----------------+------------+  Arterial wall calcification precludes accurate ankle pressures and ABIs. Bilateral ABIs and TBIs appear essentially unchanged compared to prior study on 05/22/21.  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 The right toe-brachial index is abnormal. *See table(s) above for measurements and observations.     Preliminary    DG Foot Complete Right  Result Date: 04/10/2023 CLINICAL DATA:  Right foot infection. EXAM: RIGHT FOOT COMPLETE - 3+ VIEW COMPARISON:  Right foot x-rays dated December 12, 2019. FINDINGS: No bony destruction or periosteal reaction. 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 No acute fracture or dislocation. Joint spaces are preserved. Bone mineralization is normal. IMPRESSION: 1. No acute osseous abnormality. Electronically Signed   By: Obie Dredge M.D.   On: 04/10/2023 09:13   US Abdomen Limited RUQ (LIVER/GB)  Result Date: 04/10/2023 CLINICAL DATA:  Elevated liver function tests. EXAM: ULTRASOUND ABDOMEN LIMITED RIGHT UPPER QUADRANT COMPARISON:  None Available. FINDINGS: Gallbladder: No gallstones or wall thickening visualized (2.4 mm). No sonographic Murphy sign noted by sonographer. Common  bile duct: Diameter: 3.6 mm Liver: No focal lesion identified. Diffusely increased echogenicity of the liver parenchyma is noted. Portal vein is patent on color Doppler imaging with normal direction of blood flow towards the liver. Other: None. IMPRESSION: Hepatic steatosis without focal liver lesions. Electronically Signed   By: Aram Candela M.D.   On: 04/10/2023 01:40   DG Chest Portable 1 View  Result Date: 04/09/2023 CLINICAL DATA:  Fever and cough. EXAM: PORTABLE CHEST 1 VIEW COMPARISON:  April 17, 2022 FINDINGS: Large-bore right central venous catheter stable. Cardiomediastinal silhouette is normal. Mediastinal contours appear intact. There is no evidence of focal airspace consolidation, pleural  effusion or pneumothorax. Osseous structures are without acute abnormality. Soft tissues are grossly normal. IMPRESSION: No active disease. Electronically Signed   By: Ted Mcalpine M.D.   On: 04/09/2023 18:41    Pending Labs Unresulted Labs (From admission, onward)     Start     Ordered   04/11/23 0500  Magnesium  Tomorrow morning,   R        04/10/23 1144   04/10/23 0609  Gastrointestinal Panel by PCR , Stool  (Gastrointestinal Panel by PCR, Stool                                                                                                                                                     **Does Not include CLOSTRIDIUM DIFFICILE testing. **If CDIFF testing is needed, place order from the "C Difficile Testing" order set.**)  Once,   R        04/10/23 0608   04/10/23 0500  Comprehensive metabolic panel  Daily,   R      04/09/23 2237   04/10/23 0500  CBC  Daily,   R      04/09/23 2237            Vitals/Pain Today's Vitals   04/10/23 1030 04/10/23 1050 04/10/23 1125 04/10/23 1131  BP: 128/82   131/76  Pulse: 96 97  96  Resp: 16   12  Temp:   98.9 F (37.2 C)   TempSrc:      SpO2: 97% 96%  97%  Weight:      Height:      PainSc:        Isolation Precautions Enteric  precautions (UV disinfection)  Medications Medications  cinacalcet (SENSIPAR) tablet 120 mg (120 mg Oral Given 04/10/23 0035)  insulin aspart (novoLOG) injection 0-6 Units (2 Units Subcutaneous Given 04/10/23 0737)  insulin glargine-yfgn (SEMGLEE) injection 5 Units (5 Units Subcutaneous Given 04/10/23 0037)  metroNIDAZOLE (FLAGYL) IVPB 500 mg (0 mg Intravenous Stopped 04/10/23 1142)  sodium chloride flush (NS) 0.9 % injection 3 mL (3 mLs Intravenous Not Given 04/10/23 0909)  acetaminophen (TYLENOL) tablet 650 mg (has no administration in time range)    Or  acetaminophen (TYLENOL) suppository 650 mg (has no administration in time range)  ceFEPIme (MAXIPIME) 1 g in sodium chloride 0.9 % 100 mL IVPB (has no administration in time range)  vancomycin variable dose per unstable renal function (pharmacist dosing) (has no administration in time range)  sevelamer carbonate (RENVELA) tablet 4,000 mg (4,000 mg Oral Given 04/10/23 0738)  sevelamer carbonate (RENVELA) tablet 2,400 mg (has no administration in time range)  fentaNYL (SUBLIMAZE) injection 25-50 mcg (50 mcg Intravenous Given 04/10/23 0629)  vancomycin (VANCOCIN) IVPB 1000 mg/200 mL premix (0 mg Intravenous Stopped 04/09/23 2222)  ceFEPIme (MAXIPIME) 1 g in sodium chloride 0.9 % 100 mL IVPB (0  g Intravenous Stopped 04/09/23 2324)  lactated ringers bolus 500 mL (0 mLs Intravenous Stopped 04/09/23 2324)  vancomycin (VANCOREADY) IVPB 750 mg/150 mL (0 mg Intravenous Stopped 04/10/23 0306)    Mobility Left BKA, pt is able to transfer to bedside commode without difficulty     Focused Assessments Cardiac Assessment Handoff:  Cardiac Rhythm: Sinus tachycardia Lab Results  Component Value Date   TROPONINI <0.03 05/27/2015   Lab Results  Component Value Date   DDIMER 1.56 (H) 02/14/2020   Does the Patient currently have chest pain? No    R Recommendations: See Admitting Provider Note  Report given to:   Additional Notes:

## 2023-04-10 NOTE — Consult Note (Addendum)
PODIATRY CONSULTATION  NAME [name] MRN [id number] DOB 1980/11/29 DOA 04/09/2023   Reason for consult: RLE cellulitis/sepsis Chief Complaint  Patient presents with   Fever    Pt coming from [facility] with concerns for sepsis per staff. Pt with a fever, chills, and infection to right foot. Pt also presents with a cough for weeks, some emesis. Dialysis was completed.     Consulting physician: Glyn Ade MD, [facility] ED  History of present illness: 42 y.o. male PMHx IDDM, ESRD on HD, AKA LLE admitted seen in the emergency department with fever, chills, elevated heart rate.  P presented to the ED from dialysis.  ED Course: Upon arrival to the ED, patient is found to have Tmax of 37.7 C, heart rate 120s, and stable blood pressure.  Chest x-ray was negative for acute findings and respiratory virus panel was negative.  Lactic acid was reassuringly normal.  WBC is elevated to 17,400, total bilirubin is 2.1, alkaline phosphatase 197, AST 78, and ALT 86.   Podiatry consulted for concern of worsening infection RLE.  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, with necrosis of bone [facility])    Dialysis patient [name])    Hypertension    Renal disorder    Dialysis   Sepsis ([name])        Latest Ref Rng & Units 04/10/2023    2:46 AM 04/09/2023    6:15 PM 04/17/2022    4:08 PM  CBC  WBC 4.0 - 10.5 K/uL 18.2  17.4    Hemoglobin 13.0 - 17.0 g/dL 40.9  81.1  91.4   Hematocrit 39.0 - 52.0 % 34.0  33.8  46.0   Platelets 150 - 400 K/uL 106  111         Latest Ref Rng & Units 04/10/2023    2:46 AM 04/09/2023    6:15 PM 04/17/2022    5:32 PM  BMP  Glucose 70 - 99 mg/dL 782  956  213   BUN 6 - 20 mg/dL 37  30  42    Creatinine 0.61 - 1.24 mg/dL 0.86  5.78  4.69   Sodium 135 - 145 mmol/L 132  134  133   Potassium 3.5 - 5.1 mmol/L 3.7  3.8  4.1   Chloride 98 - 111 mmol/L 97  97  98   CO2 22 - 32 mmol/L 16  20  20    Calcium 8.9 - 10.3 mg/dL 8.5  8.9  8.6     RT foot 04/10/2023  RT foot 04/10/2023   Physical Exam: General: The patient is alert and oriented x3 in no acute distress.   Dermatology: Skin is warm to touch.  Based on exam this morning there is no significant drainage, purulence or malodor coming from the forefoot.  Slight interdigital maceration but no apparent open wound, sinus tract, or chronic ulcer noted.  Callus tissue noted to the plantar forefoot with dry blood.  Appears chronic/stable.  Vascular: Skin is warm to touch.  No obvious indication of erythema/cellulitis.  No appreciable significant edema.  Neurological: Light touch and protective threshold absent  Musculoskeletal Exam: Ambulatory with prosthesis. LLE AKA. 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  No crepitus with palpation of the soft tissue.  VAS [place] ABI W/WO TBI 05/22/2021 ABI Findings:  +---------+------------------+-----+---------+--------+  Right   Rt Pressure (mmHg)IndexWaveform Comment   +---------+------------------+-----+---------+--------+  Brachial 166                    triphasic          +---------+------------------+-----+---------+--------+  PTA                            absent             +---------+------------------+-----+---------+--------+  DP      Noncompressible        triphasic          +---------+------------------+-----+---------+--------+  Great Toe74                0.45                    +---------+------------------+-----+---------+--------+   +--------+------------------+-----+--------+-------------------------------  ----+  Left   Lt Pressure (mmHg)IndexWaveformComment                   Brachial                              Unable to assess due to IV   location  +--------+------------------+-----+--------+-------------------------------  +-------+---------------+-----------+------------+------------+  ABI/TBIToday's ABI    Today's TBIPrevious ABIPrevious TBI  +-------+---------------+-----------+------------+------------+  Right Noncompressible0.45                                 +-------+---------------+-----------+------------+------------+  Left  AKA            AKA                                  +-------+---------------+-----------+------------+------------+  Summary:  Right: Resting right ankle-brachial index indicates noncompressible right lower extremity arteries. The right toe-brachial index is abnormal.   Radiographic exam RT foot TODAY, 04/10/2023 FINDINGS: No bony destruction or periosteal reaction. 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 No acute fracture or dislocation. Joint spaces are preserved. Bone mineralization is normal.   IMPRESSION: 1. No acute osseous abnormality.  ASSESSMENT/PLAN OF CARE Cellulitis RT foot IDDM uncontrolled H/o AKA LLE  -Patient seen at bedside in the [facility] ED this AM.  Chart reviewed. -X-rays reviewed.  Based on clinical evaluation and x-rays, no concern for acute changes or severe infection to the RT foot. -No findings that would warrant surgical intervention from a podiatry standpoint.   -X-rays unchanged compared to prior x-rays taken 12/12/2019 -Consider other sources contributing to sepsis -Continue antibiotics as ordered.  Cefepime 1 g Q24H. Flagyl 500 mg Q12H.  -Recommend follow-up in office postdischarge -Podiatry sign off   Thank you for the consult.  Please contact me directly via secure chat with any questions or concerns.     Felecia Shelling, DPM [facility]  Dr. Felecia Shelling, DPM    [address], [name] [address]  Office [phone]  Fax [phone]

## 2023-04-10 NOTE — ED Notes (Signed)
Pt transported to vascular.  °

## 2023-04-10 NOTE — Consult Note (Signed)
[facility]  Total days of antibiotics 2/vanco/cefepime/metronidazole       Reason for Consult: sepsis due to DFU with secondary MRSA bacteremia   Referring Physician: [email or web address]  Principal Problem:   Diabetic foot infection ([name]) Active Problems:   Insulin dependent type 1 diabetes mellitus ([name])   ESRD on dialysis ([name])   Elevated LFTs   Thrombocytopenia ([name])    HPI: [name] is a 42 y.o. male with hx of IDDM, ESRD on HD, with HD catheter, hx of left AKA, admitted for worsening pain to his right foot wound on plantar forefoot region. He also subscribed to feeling poorly with fever, and chills. He did go to HD but due to ongoing tachycardia-he was referred to hosptial for evaluation. Labs significant for leukocytosis of 17.4K. mild transaminitis in the 70-80s. But LA was WNL. And afebrile. His exam was noteable for right foot cracked, ucler to plantar aspect of foot near 2nd MTP, serosanginous drainage. +maladorous. He was started on broad spectrum abtx. 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, with necrosis of bone ([name])    Dialysis patient ([name])    Hypertension    Renal disorder    Dialysis   Sepsis ([name])     Allergies: No Known Allergies   MEDICATIONS:  cinacalcet  120 mg Oral QPM   insulin aspart  0-6 Units Subcutaneous Q4H   insulin glargine-yfgn  5 Units Subcutaneous QHS   sevelamer carbonate  4,000 mg Oral TID WC   sodium chloride flush  3 mL Intravenous Q12H   vancomycin variable dose per unstable renal function (pharmacist dosing)   Does not apply See admin instructions    Social History   Tobacco Use   Smoking status: Never   Smokeless tobacco: Never  Vaping  Use   Vaping status: Never Used  Substance Use Topics   Alcohol use: No   Drug use: No    Family History  Problem Relation Age of Onset   Diabetes Mellitus II Other    Diabetes Father    Renal Disease Father        ESRD    Review of Systems - 12 point ros is negative per what is mentioned in the hpi. In addition, he is having numerous loose stools. + subscribes to some abdominal pain. No blood in stool   OBJECTIVE: Temp:  [98.2 F (36.8 C)-99.9 F (37.7 C)] 99.6 F (37.6 C) (07/13 0731) Pulse Rate:  [100-129] 103 (07/13 0731) Resp:  [12-21] 16 (07/13 0731) BP: (110-144)/(46-89) 116/68 (07/13 0731) SpO2:  [88 %-99 %] 98 % (07/13 0731) Weight:  [83.9 kg] 83.9 kg (07/12 1706) Physical Exam  Constitutional: He is oriented to person, place, and time. He appears well-developed and well-nourished. No distress.  HENT:  Mouth/Throat: Oropharynx is clear and moist. No oropharyngeal exudate.  Chest wall: dressing on hd catheter is loose. No surrounding erythema Cardiovascular: Normal rate, regular rhythm and normal heart sounds. Exam reveals no gallop and no friction rub.  No murmur heard.  Pulmonary/Chest: Effort normal and breath sounds normal. No respiratory distress. He has no wheezes.  Abdominal: Soft. Bowel sounds are normal. He exhibits no distension.  mild tenderness.  Lymphadenopathy:  He has no cervical adenopathy.  Neurological: He is alert and oriented to person, place, and time.  Skin: Skin is warm and dry. No rash noted. No erythema.  Ext: right foot has had digital amputation (5th digit amputated) plantar aspect has cracked ulceration some drainage. Psychiatric: He has a normal mood and affect. His behavior is normal.    LABS: Results for orders placed or performed during the hospital encounter of 04/09/23 (from the past 48 hour(s))  Resp panel by RT-PCR (RSV, Flu A&B, Covid) Anterior Nasal Swab     Status: None   Collection Time: 04/09/23  5:36 PM   Specimen:  Anterior Nasal Swab  Result Value Ref Range   SARS Coronavirus 2 by RT PCR NEGATIVE NEGATIVE   Influenza A by PCR NEGATIVE NEGATIVE   Influenza B by PCR NEGATIVE NEGATIVE    Comment: (NOTE) The Xpert Xpress SARS-CoV-2/FLU/RSV plus assay is intended as an aid in the diagnosis of influenza from Nasopharyngeal swab specimens and should not be used as a sole basis for treatment. Nasal washings and aspirates are unacceptable for Xpert Xpress SARS-CoV-2/FLU/RSV testing.  Fact Sheet for Patients: BloggerCourse.com  Fact Sheet for Healthcare Providers: SeriousBroker.it  This test is not yet approved or cleared by the [place] FDA and has been authorized for detection and/or diagnosis of SARS-CoV-2 by FDA under an Emergency Use Authorization (EUA). This EUA will remain in effect (meaning this test can be used) for the duration of the COVID-19 declaration under Section 564(b)(1) of the Act, 21 U.S.C. section 360bbb-3(b)(1), unless the authorization is terminated or revoked.     Resp Syncytial Virus by PCR NEGATIVE NEGATIVE    Comment: (NOTE) Fact Sheet for Patients: BloggerCourse.com  Fact Sheet for Healthcare Providers: SeriousBroker.it  This test is not yet approved or cleared by the [place] FDA and has been authorized for detection and/or diagnosis of SARS-CoV-2 by FDA under an Emergency Use Authorization (EUA). This EUA will remain in effect (meaning this test can be used) for the duration of the COVID-19 declaration under Section 564(b)(1) of the Act, 21 U.S.C. section 360bbb-3(b)(1), unless the authorization is terminated or revoked.  Performed at [facility], [address] [place], [name] [address]   CBC with Differential     Status: Abnormal   Collection Time: 04/09/23  6:15 PM  Result Value Ref Range   WBC 17.4 (H) 4.0 - 10.5 K/uL   RBC 3.79 (L) 4.22  - 5.81 MIL/uL   Hemoglobin 11.0 (L) 13.0 - 17.0 g/dL   HCT 19.1 (L) 47.8 - 29.5 %   MCV 89.2 80.0 - 100.0 fL   MCH 29.0 26.0 - 34.0 pg   MCHC 32.5 30.0 - 36.0 g/dL   RDW 62.1 30.8 - 65.7 %   Platelets 111 (L) 150 - 400 K/uL   nRBC 0.0 0.0 - 0.2 %   Neutrophils Relative % 89 %   Neutro Abs 15.6 (H) 1.7 - 7.7 K/uL   Lymphocytes Relative 3 %   Lymphs Abs 0.5 (L) 0.7 - 4.0 K/uL   Monocytes Relative 6 %   Monocytes Absolute 1.0 0.1 - 1.0 K/uL   Eosinophils Relative 1 %   Eosinophils Absolute 0.1 0.0 - 0.5 K/uL   Basophils Relative 0 %   Basophils Absolute 0.0 0.0 - 0.1 K/uL   Immature Granulocytes 1 %   Abs Immature Granulocytes 0.16 (H) 0.00 - 0.07 K/uL    Comment: Performed at [facility], [address] [place], [name] [address]  Comprehensive metabolic panel     Status: Abnormal   Collection Time: 04/09/23  6:15 PM  Result Value Ref Range   Sodium 134 (L) 135 - 145 mmol/L   Potassium 3.8 3.5 - 5.1 mmol/L   Chloride 97 (L) 98 - 111 mmol/L   CO2 20 (L) 22 - 32 mmol/L   Glucose, Bld 192 (H) 70 - 99 mg/dL    Comment: Glucose reference range applies only to samples taken after fasting for at least 8 hours.   BUN 30 (H) 6 - 20 mg/dL   Creatinine, Ser 6.04 (H) 0.61 - 1.24 mg/dL   Calcium 8.9 8.9 - 54.0 mg/dL   Total Protein 7.4 6.5 - 8.1 g/dL   Albumin 3.2 (L) 3.5 - 5.0 g/dL   AST 78 (H) 15 - 41 U/L   ALT 86 (H) 0 - 44 U/L   Alkaline Phosphatase 197 (H) 38 - 126 U/L   Total Bilirubin 2.1 (H) 0.3 - 1.2 mg/dL   GFR, Estimated 9 (L) >60 mL/min    Comment: (NOTE) Calculated using the CKD-EPI Creatinine Equation (2021)    Anion gap 17 (H) 5 - 15    Comment: Performed at [facility], [address] [place], [name] [address]  Lactic acid, plasma     Status: None   Collection Time: 04/09/23  6:15 PM  Result Value Ref Range   Lactic Acid, Venous 1.8 0.5 - 1.9 mmol/L    Comment: Performed at [facility], [address], [place], [name] [address]  Blood  culture (routine x 2)     Status: None (Preliminary result)   Collection Time: 04/09/23  6:15 PM   Specimen: BLOOD LEFT HAND  Result Value Ref Range   Specimen Description BLOOD LEFT HAND    Special Requests      BOTTLES DRAWN AEROBIC ONLY Blood Culture adequate volume   Culture  Setup Time      GRAM POSITIVE COCCI AEROBIC BOTTLE ONLY CRITICAL RESULT CALLED TO, READ BACK BY AND VERIFIED WITH: [facility] JESSICA MILLEN [phone] AT 0841 BY EC Performed at [facility], [address] [place], [name] [address]    Culture GRAM POSITIVE COCCI    Report Status PENDING   Blood culture (routine x 2)     Status: None (Preliminary result)   Collection Time: 04/09/23  6:15 PM   Specimen: BLOOD LEFT ARM  Result Value Ref Range   Specimen Description BLOOD LEFT ARM    Special Requests      BOTTLES DRAWN AEROBIC ONLY Blood Culture results may not be optimal due to an excessive volume of blood received in culture bottles   Culture  Setup Time      GRAM POSITIVE COCCI AEROBIC BOTTLE ONLY CRITICAL VALUE NOTED.  VALUE IS CONSISTENT WITH PREVIOUSLY REPORTED AND CALLED VALUE.    Culture      NO GROWTH < 24 HOURS Performed at [facility], [address], [place], [name] [address]    Report Status PENDING   Blood Culture ID Panel (Reflexed)     Status: Abnormal   Collection Time: 04/09/23  6:15 PM  Result Value Ref Range   Enterococcus faecalis NOT DETECTED NOT DETECTED   Enterococcus Faecium NOT DETECTED NOT DETECTED   Listeria monocytogenes NOT DETECTED NOT DETECTED   Staphylococcus species DETECTED (A) NOT DETECTED    Comment: CRITICAL RESULT CALLED TO, READ BACK  BY AND VERIFIED WITH: PHAR[facility] JESSICA MILLEN [phone] AT 0841 BY EC    Staphylococcus aureus (BCID) DETECTED (A) NOT DETECTED    Comment: Methicillin (oxacillin)-resistant Staphylococcus aureus (MRSA). MRSA is predictably resistant to beta-lactam antibiotics (except ceftaroline). Preferred therapy is vancomycin unless  clinically contraindicated. Patient requires contact precautions if  hospitalized. CRITICAL RESULT CALLED TO, READ BACK BY AND VERIFIED WITH: PHARMD JESSICA MILLEN AT [phone] AT 0841 BY EC    Staphylococcus epidermidis NOT DETECTED NOT DETECTED   Staphylococcus lugdunensis NOT DETECTED NOT DETECTED   Streptococcus species NOT DETECTED NOT DETECTED   Streptococcus agalactiae NOT DETECTED NOT DETECTED   Streptococcus pneumoniae NOT DETECTED NOT DETECTED   Streptococcus pyogenes NOT DETECTED NOT DETECTED   A.calcoaceticus-baumannii NOT DETECTED NOT DETECTED   Bacteroides fragilis NOT DETECTED NOT DETECTED   Enterobacterales NOT DETECTED NOT DETECTED   Enterobacter cloacae complex NOT DETECTED NOT DETECTED   Escherichia coli NOT DETECTED NOT DETECTED   Klebsiella aerogenes NOT DETECTED NOT DETECTED   Klebsiella oxytoca NOT DETECTED NOT DETECTED   Klebsiella pneumoniae NOT DETECTED NOT DETECTED   Proteus species NOT DETECTED NOT DETECTED   Salmonella species NOT DETECTED NOT DETECTED   Serratia marcescens NOT DETECTED NOT DETECTED   Haemophilus influenzae NOT DETECTED NOT DETECTED   Neisseria meningitidis NOT DETECTED NOT DETECTED   Pseudomonas aeruginosa NOT DETECTED NOT DETECTED   Stenotrophomonas maltophilia NOT DETECTED NOT DETECTED   Candida albicans NOT DETECTED NOT DETECTED   Candida auris NOT DETECTED NOT DETECTED   Candida glabrata NOT DETECTED NOT DETECTED   Candida krusei NOT DETECTED NOT DETECTED   Candida parapsilosis NOT DETECTED NOT DETECTED   Candida tropicalis NOT DETECTED NOT DETECTED   Cryptococcus neoformans/gattii NOT DETECTED NOT DETECTED   Meth resistant mecA/C and MREJ DETECTED (A) NOT DETECTED    Comment: CRITICAL RESULT CALLED TO, READ BACK BY AND VERIFIED WITH: PHARMD JESSICA MILLEN [phone] AT 0841 BY EC Performed at [facility], [address], [place], [name] [address]   Beta-hydroxybutyric acid     Status: Abnormal   Collection Time: 04/09/23   7:44 PM  Result Value Ref Range   Beta-Hydroxybutyric Acid 1.67 (H) 0.05 - 0.27 mmol/L    Comment: Performed at [facility], [address] [place], [name] [address]  CBG monitoring, ED     Status: Abnormal   Collection Time: 04/09/23 10:04 PM  Result Value Ref Range   Glucose-Capillary 223 (H) 70 - 99 mg/dL    Comment: Glucose reference range applies only to samples taken after fasting for at least 8 hours.  CBG monitoring, ED     Status: Abnormal   Collection Time: 04/10/23  1:36 AM  Result Value Ref Range   Glucose-Capillary 169 (H) 70 - 99 mg/dL    Comment: Glucose reference range applies only to samples taken after fasting for at least 8 hours.  Sedimentation rate     Status: Abnormal   Collection Time: 04/10/23  2:46 AM  Result Value Ref Range   Sed Rate 46 (H) 0 - 16 mm/hr    Comment: Performed at [facility], [address] [place], [name] [address]  C-reactive protein     Status: Abnormal   Collection Time: 04/10/23  2:46 AM  Result Value Ref Range   CRP 22.3 (H) <1.0 mg/dL    Comment: Performed at [facility], [address] [place], [name] [address]  Prealbumin     Status: Abnormal  Collection Time: 04/10/23  2:46 AM  Result Value Ref Range   Prealbumin 12 (L) 18 - 38 mg/dL    Comment: Performed at [facility], [address] [place], [name] [address]  Comprehensive metabolic panel     Status: Abnormal   Collection Time: 04/10/23  2:46 AM  Result Value Ref Range   Sodium 132 (L) 135 - 145 mmol/L   Potassium 3.7 3.5 - 5.1 mmol/L   Chloride 97 (L) 98 - 111 mmol/L   CO2 16 (L) 22 - 32 mmol/L   Glucose, Bld 132 (H) 70 - 99 mg/dL    Comment: Glucose reference range applies only to samples taken after fasting for at least 8 hours.   BUN 37 (H) 6 - 20 mg/dL   Creatinine, Ser 2.13 (H) 0.61 - 1.24 mg/dL   Calcium 8.5 (L) 8.9 - 10.3 mg/dL   Total Protein 6.5 6.5 - 8.1 g/dL   Albumin 2.9 (L) 3.5 - 5.0 g/dL   AST 56 (H) 15 - 41 U/L   ALT  67 (H) 0 - 44 U/L   Alkaline Phosphatase 168 (H) 38 - 126 U/L   Total Bilirubin 1.6 (H) 0.3 - 1.2 mg/dL   GFR, Estimated 8 (L) >60 mL/min    Comment: (NOTE) Calculated using the CKD-EPI Creatinine Equation (2021)    Anion gap 19 (H) 5 - 15    Comment: ELECTROLYTES REPEATED TO VERIFY Performed at [facility], [address] [place], [name] [address]   Phosphorus     Status: Abnormal   Collection Time: 04/10/23  2:46 AM  Result Value Ref Range   Phosphorus 5.3 (H) 2.5 - 4.6 mg/dL    Comment: Performed at [facility], [address], [place], [name] [address]  Magnesium     Status: None   Collection Time: 04/10/23  2:46 AM  Result Value Ref Range   Magnesium 2.3 1.7 - 2.4 mg/dL    Comment: Performed at [facility], [address], [place], [name] [address]  CBC     Status: Abnormal   Collection Time: 04/10/23  2:46 AM  Result Value Ref Range   WBC 18.2 (H) 4.0 - 10.5 K/uL   RBC 3.64 (L) 4.22 - 5.81 MIL/uL   Hemoglobin 10.7 (L) 13.0 - 17.0 g/dL   HCT 41.3 (L) 24.4 - 01.0 %   MCV 93.4 80.0 - 100.0 fL   MCH 29.4 26.0 - 34.0 pg   MCHC 31.5 30.0 - 36.0 g/dL   RDW 27.2 53.6 - 64.4 %   Platelets 106 (L) 150 - 400 K/uL   nRBC 0.0 0.0 - 0.2 %    Comment: Performed at [facility], [address] [place], [name] [address]  CBG monitoring, ED     Status: Abnormal   Collection Time: 04/10/23  4:05 AM  Result Value Ref Range   Glucose-Capillary 102 (H) 70 - 99 mg/dL    Comment: Glucose reference range applies only to samples taken after fasting for at least 8 hours.  C Difficile Quick Screen w PCR reflex     Status: None   Collection Time: 04/10/23  6:09 AM   Specimen: STOOL  Result Value Ref Range   C Diff antigen NEGATIVE NEGATIVE   C Diff toxin NEGATIVE NEGATIVE   C Diff interpretation No C. difficile detected.     Comment: Performed at [facility], [address] [place], [name] [address]  CBG monitoring, ED     Status: Abnormal    Collection Time: 04/10/23  7:26 AM  Result Value Ref Range   Glucose-Capillary 226 (H) 70 - 99 mg/dL    Comment: Glucose reference range applies only to samples taken after fasting for at least 8 hours.    MICRO: 7/12 blood cx MRSA IMAGING: DG Foot Complete Right  Result Date: 04/10/2023 CLINICAL DATA:  Right foot infection. EXAM: RIGHT FOOT COMPLETE - 3+ VIEW COMPARISON:  Right foot x-rays dated December 12, 2019. FINDINGS: No bony destruction or periosteal reaction. 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 No acute fracture or dislocation. Joint spaces are preserved. Bone mineralization is normal. IMPRESSION: 1. No acute osseous abnormality. Electronically Signed   By: Obie Dredge M.D.   On: 04/10/2023 09:13   US Abdomen Limited RUQ (LIVER/GB)  Result Date: 04/10/2023 CLINICAL DATA:  Elevated liver function tests. EXAM: ULTRASOUND ABDOMEN LIMITED RIGHT UPPER QUADRANT COMPARISON:  None Available. FINDINGS: Gallbladder: No gallstones or wall thickening visualized (2.4 mm). No sonographic Murphy sign noted by sonographer. Common bile duct: Diameter: 3.6 mm Liver: No focal lesion identified. Diffusely increased echogenicity of the liver parenchyma is noted. Portal vein is patent on color Doppler imaging with normal direction of blood flow towards the liver. Other: None. IMPRESSION: Hepatic steatosis without focal liver lesions. Electronically Signed   By: Aram Candela M.D.   On: 04/10/2023 01:40   DG Chest Portable 1 View  Result Date: 04/09/2023 CLINICAL DATA:  Fever and cough. EXAM: PORTABLE CHEST 1 VIEW COMPARISON:  April 17, 2022 FINDINGS: Large-bore right central venous catheter stable. Cardiomediastinal silhouette is normal. Mediastinal contours appear intact. There is no evidence of focal airspace consolidation, pleural effusion or pneumothorax. Osseous structures are without acute abnormality. Soft tissues are grossly normal. IMPRESSION: No active disease. Electronically  Signed   By: Ted Mcalpine M.D.   On: 04/09/2023 18:41     Assessment/Plan:  42yo M with ESRD on HD and right foot ulcer admitted for fever, chills, worsening pain/drainage from right foot. Found to have SIRS  --(WBC and elevated HR) - work up found to have MRSA bacteremia   - recommend to continue on broad spectrum abtx - remove HD line for line holiday - repeat blood cx when temp HD line is removed - recommend CT or mri of foot to see if there is abscess where he would need I x D - please get TTE initial work up - diarrhea - possibly due to bacteremia, will continue to monitor. Cdiff ruled out.  Duke Salvia Drue Second MD MPH [facility] [phone]

## 2023-04-10 NOTE — Progress Notes (Signed)
Pt arrived to room 4E 02 via stretcher from the ED. See assessment. Will continue to monitor.

## 2023-04-10 NOTE — Progress Notes (Signed)
VASCULAR LAB    ABI has been performed.  See CV proc for preliminary results.   Juliett Eastburn, RVT 04/10/2023, 12:06 PM

## 2023-04-10 NOTE — Progress Notes (Addendum)
TRH night cross cover note:   I was notified by RN that this patient is complaining of abdominal discomfort and requesting additional IV fentanyl, noting that his similar abdominal discomfort improved with previous doses of prn IV fentanyl.  I subsequently placed order for fentanyl 50 mcg IV every 2 hours as needed.   Update: patient refusing tele. Tele orders subsequently d\c'ed.    Shane Pigg, DO Hospitalist

## 2023-04-11 DIAGNOSIS — N186 End stage renal disease: Secondary | ICD-10-CM | POA: Diagnosis not present

## 2023-04-11 DIAGNOSIS — E1122 Type 2 diabetes mellitus with diabetic chronic kidney disease: Secondary | ICD-10-CM | POA: Diagnosis not present

## 2023-04-11 DIAGNOSIS — E11621 Type 2 diabetes mellitus with foot ulcer: Secondary | ICD-10-CM | POA: Diagnosis not present

## 2023-04-11 DIAGNOSIS — L97509 Non-pressure chronic ulcer of other part of unspecified foot with unspecified severity: Secondary | ICD-10-CM | POA: Diagnosis not present

## 2023-04-11 DIAGNOSIS — R7989 Other specified abnormal findings of blood chemistry: Secondary | ICD-10-CM | POA: Diagnosis not present

## 2023-04-11 DIAGNOSIS — E1022 Type 1 diabetes mellitus with diabetic chronic kidney disease: Secondary | ICD-10-CM | POA: Diagnosis not present

## 2023-04-11 DIAGNOSIS — D696 Thrombocytopenia, unspecified: Secondary | ICD-10-CM | POA: Diagnosis not present

## 2023-04-11 LAB — COMPREHENSIVE METABOLIC PANEL
ALT: 51 U/L — ABNORMAL HIGH (ref 0–44)
AST: 23 U/L (ref 15–41)
Albumin: 3.1 g/dL — ABNORMAL LOW (ref 3.5–5.0)
Alkaline Phosphatase: 176 U/L — ABNORMAL HIGH (ref 38–126)
Anion gap: 18 — ABNORMAL HIGH (ref 5–15)
BUN: 56 mg/dL — ABNORMAL HIGH (ref 6–20)
CO2: 16 mmol/L — ABNORMAL LOW (ref 22–32)
Calcium: 7.6 mg/dL — ABNORMAL LOW (ref 8.9–10.3)
Chloride: 92 mmol/L — ABNORMAL LOW (ref 98–111)
Creatinine, Ser: 10.23 mg/dL — ABNORMAL HIGH (ref 0.61–1.24)
GFR, Estimated: 6 mL/min — ABNORMAL LOW (ref >60)
Glucose, Bld: 278 mg/dL — ABNORMAL HIGH (ref 70–99)
Potassium: 4 mmol/L (ref 3.5–5.1)
Sodium: 126 mmol/L — ABNORMAL LOW (ref 135–145)
Total Bilirubin: 1.2 mg/dL (ref 0.3–1.2)
Total Protein: 7.2 g/dL (ref 6.5–8.1)

## 2023-04-11 LAB — GLUCOSE, CAPILLARY
Glucose-Capillary: 269 mg/dL — ABNORMAL HIGH (ref 70–99)
Glucose-Capillary: 278 mg/dL — ABNORMAL HIGH (ref 70–99)
Glucose-Capillary: 279 mg/dL — ABNORMAL HIGH (ref 70–99)
Glucose-Capillary: 314 mg/dL — ABNORMAL HIGH (ref 70–99)
Glucose-Capillary: 393 mg/dL — ABNORMAL HIGH (ref 70–99)

## 2023-04-11 LAB — CBC
HCT: 36.2 % — ABNORMAL LOW (ref 39.0–52.0)
Hemoglobin: 11.7 g/dL — ABNORMAL LOW (ref 13.0–17.0)
MCH: 29.1 pg (ref 26.0–34.0)
MCHC: 32.3 g/dL (ref 30.0–36.0)
MCV: 90 fL (ref 80.0–100.0)
Platelets: 146 10*3/uL — ABNORMAL LOW (ref 150–400)
RBC: 4.02 MIL/uL — ABNORMAL LOW (ref 4.22–5.81)
RDW: 14.3 % (ref 11.5–15.5)
WBC: 13.9 10*3/uL — ABNORMAL HIGH (ref 4.0–10.5)
nRBC: 0 % (ref 0.0–0.2)

## 2023-04-11 LAB — MAGNESIUM: Magnesium: 2.5 mg/dL — ABNORMAL HIGH (ref 1.7–2.4)

## 2023-04-11 LAB — VAS US ABI WITH/WO TBI

## 2023-04-11 MED ORDER — GLUCERNA SHAKE PO LIQD: 237.0000 mL | Freq: Two times a day (BID) | ORAL | Status: DC

## 2023-04-11 MED ORDER — CHLORHEXIDINE GLUCONATE CLOTH 2 % EX PADS
6.0000 | MEDICATED_PAD | Freq: Every day | CUTANEOUS | Status: DC
  Administered 2023-04-12 – 2023-04-13 (×2): 6 via TOPICAL

## 2023-04-11 MED ORDER — JUVEN PO PACK: 1.0000 | PACK | Freq: Two times a day (BID) | ORAL | Status: DC

## 2023-04-11 NOTE — Progress Notes (Signed)
[facility]    Date of Admission:  04/09/2023   Total days of antibiotics 3   ID: BENZINO BUZZETTA is a 42 y.o. male with  MRSA bacteremia Principal Problem:   Diabetic foot infection ([name]) Active Problems:   Insulin dependent type 1 diabetes mellitus ([name])   ESRD on dialysis ([name])   Elevated LFTs   Thrombocytopenia ([name])    Subjective: Feeling better than yesterday. Anxious to get home  Medications:   Chlorhexidine Gluconate Cloth  6 each Topical Daily   cinacalcet  120 mg Oral QPM   insulin aspart  0-6 Units Subcutaneous Q4H   insulin glargine-yfgn  5 Units Subcutaneous QHS   sevelamer carbonate  4,000 mg Oral TID WC   sodium chloride flush  3 mL Intravenous Q12H    Objective: Vital signs in last 24 hours: Temp:  [97.4 F (36.3 C)-98.5 F (36.9 C)] 97.9 F (36.6 C) (07/14 1245) Pulse Rate:  [89-100] 91 (07/14 1245) Resp:  [13-20] 20 (07/14 1245) BP: (115-147)/(72-95) 125/87 (07/14 1245) SpO2:  [96 %-100 %] 99 % (07/14 1245)  Physical Exam  Constitutional: He is oriented to person, place, and time. He appears well-developed and well-nourished. No distress.  HENT:  Mouth/Throat: Oropharynx is clear and moist. No oropharyngeal exudate.  Cardiovascular: Normal rate, regular rhythm and normal heart sounds. Exam reveals no gallop and no friction rub.  No murmur heard.  Chest wall: hd catheter in place Pulmonary/Chest: Effort normal and breath sounds normal. No respiratory distress. He has no wheezes.  Abdominal: Soft. Bowel sounds are normal. He exhibits no distension. There is no tenderness.  Lymphadenopathy:  He has no cervical adenopathy.  Neurological: He is alert and oriented to person, place, and time.  Skin: Skin is warm and dry. No rash noted. No erythema. Right foot ulcer Psychiatric: He has a normal mood and affect. His behavior is normal.    Lab Results Recent Labs    04/10/23 0246 04/11/23 0537  WBC 18.2* 13.9*  HGB 10.7*  11.7*  HCT 34.0* 36.2*  NA 132* 126*  K 3.7 4.0  CL 97* 92*  CO2 16* 16*  BUN 37* 56*  CREATININE 7.78* 10.23*   Liver Panel Recent Labs    04/10/23 0246 04/11/23 0537  PROT 6.5 7.2  ALBUMIN 2.9* 3.1*  AST 56* 23  ALT 67* 51*  ALKPHOS 168* 176*  BILITOT 1.6* 1.2   Sedimentation Rate Recent Labs    04/10/23 0246  ESRSEDRATE 46*   C-Reactive Protein Recent Labs    04/10/23 0246  CRP 22.3*    Microbiology: 7/12 blood cx MRSA Studies/Results: VAS [place] ABI WITH/WO TBI  Result Date: 04/11/2023  LOWER EXTREMITY DOPPLER STUDY Patient Name:  [name]  Date of Exam:   04/10/2023 Medical Rec #: [id number]      Accession #:    [id number] Date of Birth: 10-02-1980     Patient Gender: M Patient Age:   3 years Exam Location:  [facility] Procedure:      VAS [place] ABI WITH/WO TBI Referring Phys: TIMOTHY OPYD --------------------------------------------------------------------------------  Indications: Ulceration. High Risk Factors: Hypertension, Diabetes. 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 Comparison Study: Prior ABI done 05/22/21 Performing Technologist: Sherren Kerns RVS  Examination Guidelines: A complete evaluation includes at minimum, Doppler waveform signals and systolic blood pressure reading at the level of bilateral brachial, anterior tibial, and posterior tibial arteries, when vessel segments are accessible. Bilateral  testing is considered an integral part of a complete examination. Photoelectric Plethysmograph (PPG) waveforms and toe systolic pressure readings are included as required and additional duplex testing as needed. Limited examinations for reoccurring indications may be performed as noted.  ABI Findings: +---------+------------------+-----+---------+--------+ Right    Rt Pressure (mmHg)IndexWaveform Comment  +---------+------------------+-----+---------+--------+ Brachial 170                    triphasic          +---------+------------------+-----+---------+--------+ PTA      253               1.49 triphasic         +---------+------------------+-----+---------+--------+ DP       162               0.95 triphasic         +---------+------------------+-----+---------+--------+ Great Toe100               0.59                   +---------+------------------+-----+---------+--------+ +--------+------------------+-----+---------+-------+ Left    Lt Pressure (mmHg)IndexWaveform Comment +--------+------------------+-----+---------+-------+ [id number]                    triphasicAKA     +--------+------------------+-----+---------+-------+ +-------+-------------+-----------+----------------+------------+ ABI/TBIToday's ABI  Today's TBIPrevious ABI    Previous TBI +-------+-------------+-----------+----------------+------------+ Right  1.49/non comp0.59       non compressible0.45         +-------+-------------+-----------+----------------+------------+ Left   AKA          AKA        AKA             AKA          +-------+-------------+-----------+----------------+------------+  Arterial wall calcification precludes accurate ankle pressures and ABIs. Bilateral ABIs and TBIs appear essentially unchanged compared to prior study on 05/22/21.  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 The right toe-brachial index is abnormal. *See table(s) above for measurements and observations.  Electronically signed by Gerarda Fraction on 04/11/2023 at 12:02:18 PM.    Final    DG Foot Complete Right  Result Date: 04/10/2023 CLINICAL DATA:  Right foot infection. EXAM: RIGHT FOOT COMPLETE - 3+ VIEW COMPARISON:  Right foot x-rays dated December 12, 2019. FINDINGS: No bony destruction or periosteal reaction. 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 No acute fracture or dislocation. Joint spaces are preserved. Bone mineralization is  normal. IMPRESSION: 1. No acute osseous abnormality. Electronically Signed   By: Obie Dredge M.D.   On: 04/10/2023 09:13   US Abdomen Limited RUQ (LIVER/GB)  Result Date: 04/10/2023 CLINICAL DATA:  Elevated liver function tests. EXAM: ULTRASOUND ABDOMEN LIMITED RIGHT UPPER QUADRANT COMPARISON:  None Available. FINDINGS: Gallbladder: No gallstones or wall thickening visualized (2.4 mm). No sonographic Murphy sign noted by sonographer. Common bile duct: Diameter: 3.6 mm Liver: No focal lesion identified. Diffusely increased echogenicity of the liver parenchyma is noted. Portal vein is patent on color Doppler imaging with normal direction of blood flow towards the liver. Other: None. IMPRESSION: Hepatic steatosis without focal liver lesions. Electronically Signed   By: Aram Candela M.D.   On: 04/10/2023 01:40   DG Chest Portable 1 View  Result Date: 04/09/2023 CLINICAL DATA:  Fever and cough. EXAM: PORTABLE CHEST 1 VIEW COMPARISON:  April 17, 2022 FINDINGS: Large-bore right central venous catheter stable. Cardiomediastinal silhouette is normal. Mediastinal contours appear intact. There is no evidence of focal  airspace consolidation, pleural effusion or pneumothorax. Osseous structures are without acute abnormality. Soft tissues are grossly normal. IMPRESSION: No active disease. Electronically Signed   By: Ted Mcalpine M.D.   On: 04/09/2023 18:41     Assessment/Plan: MRSA bacteremia = will repeat blood cx today to see if cleared; continue on vancomycin; discussed with the patient  the need to remove hd line tomorrow after HD session, then line holiday. Long discussion with patient about the seriousness of infection. Will need TTE.  Esrd on hd = to get HD session on m-w-f  Right foot diabetic ulcer = continue on vanco/cefepime and oral metronidazole for now. Recommend mri of right foot.  I have personally spent 50 minutes involved in face-to-face and non-face-to-face activities for this  patient on the day of the visit. Professional time spent includes the following activities: Preparing to see the patient (review of tests), Obtaining and/or reviewing separately obtained history (admission/discharge record), Performing a medically appropriate examination and/or evaluation , Ordering medications/tests/procedures, referring and communicating with other health care professionals, Documenting clinical information in the EMR, Independently interpreting results (not separately reported), Communicating results to the patient/family/caregiver, Counseling and educating the patient/family/caregiver and Care coordination (not separately reported).       [facility] Pager: [phone]  04/11/2023, 2:43 PM

## 2023-04-11 NOTE — Progress Notes (Signed)
   PODIATRY CONSULTATION  NAME [name] MRN [id number] DOB 06-30-1981 DOA 04/09/2023    HPI: 42 y.o. male admitted for sepsis and worsening infection to the right forefoot.  Patient seen and evaluated yesterday, 04/10/2023 [facility] ED.  X-rays negative for erosive changes or acute osteomyelitis.  Initial evaluation the foot appears very stable without any significant erythema or edema or fluctuance around the forefoot.  Persistent leukocytosis.  On IV antibiotics since admission.  ID consulted always greatly appreciated. ABIs completed.   Subjective: Very mild malodor this morning.  Persistent hyperkeratotic callus tissue noted to the plantar forefoot with fissuring of skin and scant serous drainage.  ID recommend MRI right forefoot to r/o abscess. Clinically the foot continues to appear very stable w/o significant edema or erythema.  PLAN OF CARE -Patient seen this AM at bedside. -MRI ordered RT foot w/o contrast -Continue IV ABX -Will follow MRI results. If no abscess or concern of osteomyelitis will have pt f/u outpatient and debridement of calluses in office.     Latest Ref Rng & Units 04/11/2023    5:37 AM 04/10/2023    2:46 AM 04/09/2023    6:15 PM  CBC  WBC 4.0 - 10.5 K/uL 13.9  18.2  17.4   Hemoglobin 13.0 - 17.0 g/dL 40.9  81.1  91.4   Hematocrit 39.0 - 52.0 % 36.2  34.0  33.8   Platelets 150 - 400 K/uL 146  106  111        Latest Ref Rng & Units 04/11/2023    5:37 AM 04/10/2023    2:46 AM 04/09/2023    6:15 PM  BMP  Glucose 70 - 99 mg/dL 782  956  213   BUN 6 - 20 mg/dL 56  37  30   Creatinine 0.61 - 1.24 mg/dL 08.65  7.84  6.96   Sodium 135 - 145 mmol/L 126  132  134   Potassium 3.5 - 5.1 mmol/L 4.0  3.7  3.8   Chloride 98 - 111 mmol/L 92  97  97   CO2 22 - 32 mmol/L 16  16  20    Calcium 8.9 - 10.3 mg/dL 7.6  8.5  8.9    Felecia Shelling, DPM [facility]  Dr. Felecia Shelling, DPM    [address] [place],  [name] [address]                Office [phone]  Fax [phone]

## 2023-04-11 NOTE — Progress Notes (Signed)
PROGRESS NOTE    [name]  WUJ:[id number] DOB: Nov 07, 1980 DOA: 04/09/2023 PCP: [name], NP    Brief Narrative:    [name] is a 42 y.o. male with medical history significant for hypertension, insulin-dependent diabetes mellitus, ESRD on hemodialysis, and a left AKA who presented to the emergency department with fever, chills, and elevated heart rate. Patient has a foot wound at the plantar aspect of his right forefoot . In the ED, patient had temperature of 99.9 F, mildly tachycardic but normal BP. Chest x-ray was negative for acute findings and respiratory virus panel was negative.  Lactic acid was normal.  WBC was elevated to 17,400, total bilirubin is 2.1, alkaline phosphatase 197, AST 78, and ALT 86. Podiatry (Dr. [name] [name]) was consulted, blood cultures were collected, and the patient was given 500 mL of LR, vancomycin, and cefepime.  Patient was then admitted hospital for further evaluation and treatment.  During hospitalization, patient has been seen by podiatry, infectious disease.  Infectious disease recommended removal of hemodialysis catheter for line holiday.  Nephrology has been consulted at this time.  Assessment/Plan    Right Diabetic foot ulcer.   Podiatry on board.  Plan for MRI of the foot to rule out any underlying infection abscess.  Currently on vancomycin and cefepime. ABI shows noncompressible right lower extremity arteries. X-ray of the foot without acute findings.   MRSA bacteremia, on broad spectrum antibiotics. Will closely monitor.  ID has been consulted and recommend removal of hemodialysis catheter.  I have communicated with Dr. Arlean [name] nephrology about this.  Continue broad-spectrum antibiotic for now.  ESRD Monday Wednesday Friday. [name] has been consulted for hemodialysis needs.   Insulin-dependent DM with hyperglycemia. Hemoglobin A1c was 9.5% in February 2024.  Continue sliding scale insulin, Accu-Cheks, diabetic diet.  Elevated LFTs   Acute hepatitis panel negative.  Diarrhea. GI pathogen panel and c diff negative.  Supportive care.   Thrombocytopenia   Platelets 11.7 K on admission. Appears chronic, no bleeding, will monitor     Hyponatremia.  Patient is on hemodialysis ESRD.  Continue to monitor.    DVT prophylaxis: SCDs Start: 04/09/23 2237   Code Status:     Code Status: Full Code  Disposition: Home likely in 2 to 3 days.  Status is: Inpatient  Remains inpatient appropriate because: MRSA bacteremia, IV antibiotic, follow-up blood cultures, ID consultation   Family Communication: None  Consultants:  Infectious disease Podiatry Nephrology  Procedures:  None  Antimicrobials:  Cefepime, vancomycin, Flagyl  Anti-infectives (From admission, onward)    Start     Dose/Rate Route Frequency Ordered Stop   04/12/23 1200  vancomycin (VANCOCIN) IVPB 1000 mg/200 mL premix        1,000 mg 200 mL/hr over 60 Minutes Intravenous Every M-W-F (Hemodialysis) 04/10/23 1310     04/10/23 2000  ceFEPIme (MAXIPIME) 1 g in sodium chloride 0.9 % 100 mL IVPB        1 g 200 mL/hr over 30 Minutes Intravenous Every 24 hours 04/09/23 2244 04/16/23 1959   04/09/23 2300  vancomycin (VANCOREADY) IVPB 750 mg/150 mL        750 mg 150 mL/hr over 60 Minutes Intravenous  Once 04/09/23 2246 04/10/23 0306   04/09/23 2245  metroNIDAZOLE (FLAGYL) IVPB 500 mg        500 mg 100 mL/hr over 60 Minutes Intravenous Every 12 hours 04/09/23 2237 04/16/23 2244   04/09/23 2244  vancomycin variable dose per unstable renal function (pharmacist dosing)  Status:  Discontinued         Does not apply See admin instructions 04/09/23 2244 04/10/23 1311   04/09/23 1930  vancomycin (VANCOCIN) IVPB 1000 mg/200 mL premix        1,000 mg 200 mL/hr over 60 Minutes Intravenous  Once 04/09/23 1922 04/09/23 2222   04/09/23 1930  ceFEPIme (MAXIPIME) 1 g in sodium chloride 0.9 % 100 mL IVPB        1 g 200 mL/hr over 30 Minutes Intravenous  Once 04/09/23  1922 04/09/23 2324       Subjective: Today, patient was seen and examined at bedside patient denies any shortness of breath, cough, fever, chills or rigor.  Stated that he was not aware of MRSA infection   Objective: Vitals:   04/10/23 2118 04/11/23 0030 04/11/23 0430 04/11/23 0935  BP: 125/79 115/73 121/73 (!) 147/95  Pulse: 95 89 89 91  Resp: 17 18 17 20   Temp: 98.4 F (36.9 C) 98.2 F (36.8 C) (!) 97.4 F (36.3 C) 98 F (36.7 C)  TempSrc: Oral Oral Oral Oral  SpO2: 98% 97% 98% 96%  Weight:      Height:        Intake/Output Summary (Last 24 hours) at 04/11/2023 1029 Last data filed at 04/10/2023 1723 Gross per 24 hour  Intake 588.14 ml  Output --  Net 588.14 ml   Filed Weights   04/09/23 1706  Weight: 83.9 kg    Physical Examination: Body mass index is 28.98 kg/m.   General:  Average built, not in obvious distress HENT:   No scleral pallor or icterus noted. Oral mucosa is moist.  Chest:.  Right chest wall hemodialysis catheter in place. CVS: S1 &S2 heard. No murmur.  Regular rate and rhythm. Abdomen: Soft, nontender, nondistended.  Bowel sounds are heard.   Extremities: Left above-knee amputation.  Right foot ulcer with hyperkeratosis, Psych: Alert, awake and oriented, normal mood CNS:  No cranial nerve deficits.  Skin: Warm and dry.  Right foot ulcer.  Left above-knee amputation.  Data Reviewed:   CBC: Recent Labs  Lab 04/09/23 1815 04/10/23 0246 04/11/23 0537  WBC 17.4* 18.2* 13.9*  NEUTROABS 15.6*  --   --   HGB 11.0* 10.7* 11.7*  HCT 33.8* 34.0* 36.2*  MCV 89.2 93.4 90.0  PLT 111* 106* 146*    Basic Metabolic Panel: Recent Labs  Lab 04/09/23 1815 04/10/23 0246 04/11/23 0537  NA 134* 132* 126*  K 3.8 3.7 4.0  CL 97* 97* 92*  CO2 20* 16* 16*  GLUCOSE 192* 132* 278*  BUN 30* 37* 56*  CREATININE 6.96* 7.78* 10.23*  CALCIUM 8.9 8.5* 7.6*  MG  --  2.3 2.5*  PHOS  --  5.3*  --     Liver Function Tests: Recent Labs  Lab 04/09/23 1815  04/10/23 0246 04/11/23 0537  AST 78* 56* 23  ALT 86* 67* 51*  ALKPHOS 197* 168* 176*  BILITOT 2.1* 1.6* 1.2  PROT 7.4 6.5 7.2  ALBUMIN 3.2* 2.9* 3.1*     Radiology Studies: VAS [place] ABI WITH/WO TBI  Result Date: 04/10/2023  LOWER EXTREMITY DOPPLER STUDY Patient Name:  JAYVONE DIANE  Date of Exam:   04/10/2023 Medical Rec #: [id number]      Accession #:    [id number] Date of Birth: [birth year]     Patient Gender: M Patient Age:   45 years Exam Location:  [facility] Procedure:      VAS [place] ABI  WITH/WO TBI Referring Phys: TIMOTHY OPYD --------------------------------------------------------------------------------  Indications: Ulceration. High Risk Factors: Hypertension, Diabetes. 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 Comparison Study: Prior ABI done 05/22/21 Performing Technologist: Sherren Kerns RVS  Examination Guidelines: A complete evaluation includes at minimum, Doppler waveform signals and systolic blood pressure reading at the level of bilateral brachial, anterior tibial, and posterior tibial arteries, when vessel segments are accessible. Bilateral testing is considered an integral part of a complete examination. Photoelectric Plethysmograph (PPG) waveforms and toe systolic pressure readings are included as required and additional duplex testing as needed. Limited examinations for reoccurring indications may be performed as noted.  ABI Findings: +---------+------------------+-----+---------+--------+ Right    Rt Pressure (mmHg)IndexWaveform Comment  +---------+------------------+-----+---------+--------+ Brachial 170                    triphasic         +---------+------------------+-----+---------+--------+ PTA      253               1.49 triphasic         +---------+------------------+-----+---------+--------+ DP       162               0.95 triphasic         +---------+------------------+-----+---------+--------+ Great  Toe100               0.59                   +---------+------------------+-----+---------+--------+ +--------+------------------+-----+---------+-------+ Left    Lt Pressure (mmHg)IndexWaveform Comment +--------+------------------+-----+---------+-------+ [id number]                    triphasicAKA     +--------+------------------+-----+---------+-------+ +-------+-------------+-----------+----------------+------------+ ABI/TBIToday's ABI  Today's TBIPrevious ABI    Previous TBI +-------+-------------+-----------+----------------+------------+ Right  1.49/non comp0.59       non compressible0.45         +-------+-------------+-----------+----------------+------------+ Left   AKA          AKA        AKA             AKA          +-------+-------------+-----------+----------------+------------+  Arterial wall calcification precludes accurate ankle pressures and ABIs. Bilateral ABIs and TBIs appear essentially unchanged compared to prior study on 05/22/21.  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 The right toe-brachial index is abnormal. *See table(s) above for measurements and observations.     Preliminary    DG Foot Complete Right  Result Date: 04/10/2023 CLINICAL DATA:  Right foot infection. EXAM: RIGHT FOOT COMPLETE - 3+ VIEW COMPARISON:  Right foot x-rays dated December 12, 2019. FINDINGS: No bony destruction or periosteal reaction. 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 No acute fracture or dislocation. Joint spaces are preserved. Bone mineralization is normal. IMPRESSION: 1. No acute osseous abnormality. Electronically Signed   By: Obie Dredge M.D.   On: 04/10/2023 09:13   US Abdomen Limited RUQ (LIVER/GB)  Result Date: 04/10/2023 CLINICAL DATA:  Elevated liver function tests. EXAM: ULTRASOUND ABDOMEN LIMITED RIGHT UPPER QUADRANT COMPARISON:  None Available. FINDINGS: Gallbladder: No  gallstones or wall thickening visualized (2.4 mm). No sonographic Murphy sign noted by sonographer. Common bile duct: Diameter: 3.6 mm Liver: No focal lesion identified. Diffusely increased echogenicity of the liver parenchyma is noted. Portal vein is patent on color Doppler imaging with normal direction of blood flow towards the liver. Other: None. IMPRESSION: Hepatic steatosis without focal liver lesions.  Electronically Signed   By: Aram Candela M.D.   On: 04/10/2023 01:40   DG Chest Portable 1 View  Result Date: 04/09/2023 CLINICAL DATA:  Fever and cough. EXAM: PORTABLE CHEST 1 VIEW COMPARISON:  April 17, 2022 FINDINGS: Large-bore right central venous catheter stable. Cardiomediastinal silhouette is normal. Mediastinal contours appear intact. There is no evidence of focal airspace consolidation, pleural effusion or pneumothorax. Osseous structures are without acute abnormality. Soft tissues are grossly normal. IMPRESSION: No active disease. Electronically Signed   By: Ted Mcalpine M.D.   On: 04/09/2023 18:41      LOS: 2 days    Joycelyn Das, MD [facility] Available via Epic secure chat 7am-7pm After these hours, please refer to coverage provider listed on [email or web address] 04/11/2023, 10:29 AM

## 2023-04-11 NOTE — Progress Notes (Signed)
Initial Nutrition Assessment  DOCUMENTATION CODES:   Not applicable  INTERVENTION:  - Add Glucerna Shake po BID, each supplement provides 220 kcal and 10 grams of protein   - Add -1 packet Juven BID, each packet provides 95 calories, 2.5 grams of protein (collagen), and 9.8 grams of carbohydrate (3 grams sugar); also contains 7 grams of L-arginine and L-glutamine, 300 mg vitamin C, 15 mg vitamin E, 1.2 mcg vitamin B-12, 9.5 mg zinc, 200 mg calcium, and 1.5 g  Calcium Beta-hydroxy-Beta-methylbutyrate to support wound healing   NUTRITION DIAGNOSIS:   Increased nutrient needs related to wound healing as evidenced by estimated needs.  GOAL:   Patient will meet greater than or equal to 90% of their needs  MONITOR:   PO intake, Supplement acceptance  REASON FOR ASSESSMENT:   Consult Assessment of nutrition requirement/status  ASSESSMENT:   42 y.o. male admits related to fever, tachycardia. PMH includes: anemia, DM, gastroparesis, ESRD on HD. Pt is currently receiving medical management related to diabetic foot infection.  Meds reviewed:  sliding scale insulin, semglee, renvela. Labs reviewed: Na low, BUN/Creatinine high, alk phos elevated.   RD attempted to call pt's room phone but line was bust. No intakes documented per record at this time. Per record, pt has experienced a 7.5% wt loss in less than 2 months. RD will add Glucerna BID as well as Juven BID for wound healing. Will continue to monitor PO intakes.   NUTRITION - FOCUSED PHYSICAL EXAM:  Remote assessment.  Diet Order:   Diet Order             Diet Carb Modified Fluid consistency: Thin; Room service appropriate? Yes  Diet effective now                   EDUCATION NEEDS:   Not appropriate for education at this time  Skin:  Skin Assessment: Skin Integrity Issues: Skin Integrity Issues:: Diabetic Ulcer Diabetic Ulcer: R foot  Last BM:  7/13 - type 6  Height:   Ht Readings from Last 1 Encounters:   04/09/23 5\' 7"  (1.702 m)    Weight:   Wt Readings from Last 1 Encounters:  04/09/23 83.9 kg    Ideal Body Weight:     BMI:  Body mass index is 28.98 kg/m.  Estimated Nutritional Needs:   Kcal:  2100-2500 kcals  Protein:  105-125 gm  Fluid:  1 L + UOP  Lilyrose Tanney Alex Bing, RD, LDN, CNSC.

## 2023-04-11 NOTE — Consult Note (Signed)
Renal Service Consult Note [facility]  [name] 04/11/2023 Shane Krabbe, MD Requesting Physician: Dr. Tyson Babinski  Reason for Consult: ESRD pt w/ bacteremia HPI: The patient is a 42 y.o. year-old w/ PMH as below who presented to ED on 7/12 c/o fever, cough and congestion. Had HD that day and developed high HR and fever to 101 deg F. Sent to ED for further work-up. In ED pt stated he was being treated for a foot infection. Temp was 37.7, HR 120s, BP wnl. CXR negative and resp virus panel was negative. LA wnl. WBC up to 17K.  Ast/ alt/ tbili were all mildly high. Podiatry was called and blood cx's were collected, pt rec'd IV LR 500cc and IV vanc/ cefepime. Pt was admitted. We are asked to see for dialysis.   Pt seen in room. No c/o's today. No pain or redness at [facility] exit site.    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 Filetted toe flap metatarsal resection. Debridement Plantar Foot wound;  Surgeon: Park Liter, DPM;  Location: [facility] OR;  Service: Podiatry;  Laterality: Right;   AMPUTATION Left 08/20/2018   Procedure: FIFTH METATARSAL BONE BIOPSY;  Surgeon: Park Liter, DPM;  Location: [facility] OR;  Service: Podiatry;  Laterality: Left;   AMPUTATION Left 10/28/2018   Procedure: LEFT GREAT TOE AMPUTATION;   Surgeon: Park Liter, DPM;  Location: [facility] OR;  Service: Podiatry;  Laterality: Left;   AMPUTATION Left 09/16/2020   Procedure: AMPUTATION BELOW KNEE;  Surgeon: Larina Earthly, MD;  Location: [facility] OR;  Service: Vascular;  Laterality: Left;   AMPUTATION Left 10/15/2020   Procedure: AMPUTATION ABOVE KNEE;  Surgeon: Sherren Kerns, MD;  Location: [facility] OR;  Service: Vascular;  Laterality: Left;   APPLICATION OF WOUND VAC  02/02/2018   Procedure: APPLICATION OF WOUND VAC  Right Foot;  Surgeon: Park Liter, DPM;  Location: [facility] OR;  Service: Podiatry;;   APPLICATION OF WOUND VAC Left 10/28/2018   Procedure: APPLICATION OF WOUND VAC LEFT TOE;  Surgeon: Park Liter, DPM;  Location: [facility] OR;  Service: Podiatry;  Laterality: Left;   APPLICATION OF WOUND VAC Left 11/01/2018   Procedure: APPLICATION OF WOUND VAC;  Surgeon: Park Liter, DPM;  Location: [facility] OR;  Service: Podiatry;  Laterality: Left;   AV FISTULA PLACEMENT     left arm.   AV FISTULA PLACEMENT Right 12/22/2016   Procedure: INSERTION OF ARTERIOVENOUS (AV) GORE-TEX GRAFT ARM;  Surgeon: Sherren Kerns, MD;  Location: [facility] OR;  Service: Vascular;  Laterality: Right;   AV FISTULA PLACEMENT Left 05/26/2018   Procedure: INSERTION OF  ARTERIOVENOUS (AV) GORE-TEX GRAFT LEFT ARM;  Surgeon: Nada Libman, MD;  Location: [facility] OR;  Service: Vascular;  Laterality: Left;   EYE SURGERY     I & D EXTREMITY Right 10/31/2017   Procedure: IRRIGATION AND DEBRIDEMENT RIGHT FOOT;  Surgeon: Park Liter, DPM;  Location: [facility] OR;  Service: Podiatry;  Laterality: Right;   I & D EXTREMITY Left 08/20/2018   Procedure: IRRIGATION AND DEBRIDEMENT EXTREMITY WITH SECONDARY WOUND CLOSUREAND APPLICATION OF WOUND VAC LEFT FOOT;  Surgeon: Park Liter, DPM;  Location: [facility] OR;  Service: Podiatry;  Laterality: Left;   I & D EXTREMITY Left 10/20/2018   Procedure: IRRIGATION AND DEBRIDEMENT LEFT FOOT  DEBRIDEMENT LATERAL FOOT WOUND;  Surgeon: Park Liter, DPM;  Location:  [facility] OR;  Service: Podiatry;  Laterality: Left;   I & D EXTREMITY Left 10/28/2018   Procedure: IRRIGATION AND DEBRIDEMENT LEFT TOE;  Surgeon: Park Liter, DPM;  Location: [facility] OR;  Service: Podiatry;  Laterality: Left;   I & D EXTREMITY Left 09/14/2020   Procedure: IRRIGATION AND DEBRIDEMENT WRIST;  Surgeon: Betha Loa, MD;  Location: [facility] OR;  Service: Orthopedics;  Laterality: Left;   INSERTION OF DIALYSIS CATHETER     Right subclavian   IR AV DIALY SHUNT INTRO NEEDLE/INTRACATH INITIAL W/PTA/IMG RIGHT Right 02/05/2018   IR FLUORO GUIDE CV LINE RIGHT  01/31/2020   IR FLUORO GUIDE CV LINE RIGHT  01/30/2022   IR PTA VENOUS EXCEPT DIALYSIS CIRCUIT  01/30/2022   IR THROMBECTOMY AV FISTULA W/THROMBOLYSIS/PTA INC/SHUNT/IMG LEFT Left 08/24/2018   IR THROMBECTOMY AV FISTULA W/THROMBOLYSIS/PTA INC/SHUNT/IMG LEFT Left 01/06/2019   IR US GUIDE VASC ACCESS LEFT  08/24/2018   IR US GUIDE VASC ACCESS RIGHT  02/05/2018   IR US GUIDE VASC ACCESS RIGHT  01/31/2020   IR VENOCAVAGRAM SVC  01/30/2022   IRRIGATION AND DEBRIDEMENT FOOT Right 10/23/2018   Procedure: Irrigation and Debridement to tendon, Left Foot;  Surgeon: Park Liter, DPM;  Location: [facility] OR;  Service: Podiatry;  Laterality: Right;   IRRIGATION AND DEBRIDEMENT FOOT Left 11/01/2018   Procedure: IRRIGATION AND DEBRIDEMENT PARTIAL WOUND CLOSURE LOCAL TISSUE TRANSFER AND FLAP ROTATION, LEFT FOOT;  Surgeon: Park Liter, DPM;  Location: [facility] OR;  Service: Podiatry;  Laterality: Left;   TRANSMETATARSAL AMPUTATION N/A 08/18/2018   Procedure: IRRIGATION AND DEBRIDEMENT OF LEFT 5TH TOE AND TRANSMETATARSAL, WITH PARTICAL LEFT 5TH TOE AND METATARSAL AMPUTATION, BONE BIOPSY, WOUND VAC APPLICATION.;  Surgeon: Park Liter, DPM;  Location: [facility] OR;  Service: Podiatry;  Laterality: N/A;   UPPER EXTREMITY VENOGRAPHY N/A 11/16/2016   Procedure: Upper Extremity Venography - Right Central;  Surgeon: Sherren Kerns, MD;  Location: [facility] INVASIVE CV LAB;  Service:  Cardiovascular;  Laterality: N/A;   UPPER EXTREMITY VENOGRAPHY N/A 05/25/2018   Procedure: UPPER EXTREMITY VENOGRAPHY - Bilateral;  Surgeon: Cephus Shelling, MD;  Location: [facility] INVASIVE CV LAB;  Service: Cardiovascular;  Laterality: N/A;   Family History  Family History  Problem Relation Age of Onset   Diabetes Mellitus II Other    Diabetes Father    Renal Disease Father        ESRD   Social History  reports that he has never smoked. He has never used smokeless tobacco. He reports that he does not drink alcohol and does not use drugs. Allergies No Known Allergies Home medications Prior to Admission medications   Medication Sig Start Date End Date Taking? Authorizing Provider  calcitRIOL (ROCALTROL) 0.5 MCG capsule Take 3 capsules (1.5 mcg total) by mouth every Monday, Wednesday, and Friday with hemodialysis. 05/23/21  Yes Kathlen Mody, MD  cinacalcet (SENSIPAR) 60 MG tablet Take 120 mg by mouth every evening. 04/10/22  Yes [provider]  gabapentin (NEURONTIN) 100 MG capsule Take 1 capsule (100 mg total) by mouth 3 (three) times daily. 02/17/23  Yes [name], NP  glucose blood (ONETOUCH VERIO) test strip USE 1 STRIP TO CHECK GLUCOSE THREE TIMES DAILY AS DIRECTED 03/22/23  Yes [name], NP  insulin NPH-regular Human (NOVOLIN 70/30) (70-30) 100 UNIT/ML injection Inject 10 Units into the skin 2 (two) times daily with a meal. 12/18/22  Yes [name], NP  insulin regular (NOVOLIN R RELION) 100 units/mL injection INJECT 12 UNITS SUBCUTANEOUSLY THREE TIMES DAILY BEFORE MEAL(S) 12/18/22  Yes [name], NP  multivitamin (RENA-VIT) TABS tablet Take 1 tablet by mouth at bedtime. 05/23/21  Yes Kathlen Mody, MD  sevelamer carbonate (RENVELA) 800 MG tablet Take 2,400-4,000 mg by mouth See admin instructions. Take 4,000 mg (5 tablets) by mouth with meals and 2,400 mg (3 tablets) with snacks 04/27/19  Yes [provider]     Vitals:   04/11/23  0430 04/11/23 0935 04/11/23 1245 04/11/23 1600  BP: 121/73 (!) 147/95 125/87 120/84  Pulse: 89 91 91 95  Resp: 17 20 20 19   Temp: (!) 97.4 F (36.3 C) 98 F (36.7 C) 97.9 F (36.6 C) 98 F (36.7 C)  TempSrc: Oral Oral Oral Oral  SpO2: 98% 96% 99% 96%  Weight:      Height:       Exam Gen alert, no distress No rash, cyanosis or gangrene Sclera anicteric, throat clear  No jvd or bruits Chest clear bilat to bases, no rales/ wheezing RRR no MRG Abd soft ntnd no mass or ascites +bs GU defer MS no joint effusions or deformity Ext no LE or UE edema, no wounds or ulcers Neuro is alert, Ox 3 , nf    RIJ TDC intact, clean exit site    Home meds include - rocaltrol 1.5 mcg po mwf, gabapentin 100 tid, insulin nph/ regular bid, renavite, snesipar 120 hs, renvela 5 w/ meals and 3 w/ snacks     OP HD: [facility] MWF  4h  400/1.5   80kg   2K/3Ca bath  RIJ TDC   Heparin none - in July missed HD on 7/3 and 7/10 - in June missed on 6/5, 6/12, 6/19, 6/24 and 6/28 - last OP HD was 7/12, post wt 83kg (+3kg post) - rocaltrol 1.5 mcg po mwf - no esa, last Hb 11 on 7/12    Assessment/ Plan: MRSA bacteremia - w/ ^WBC and low-grade temps, improving. Will need line holiday. Plan ask IR to remove [facility] after HD tomorrow. Then will need TDC replaced on Wednesday. Pt says he can't wait here until Wednesday, wants to leave and come back.   R diabetic foot ulcer - per pmd ESRD - on HD MWF. Missed 5 sessions in June and 2 in July so far. Plan HD 1st shift tomorrow.  HTN/ volume - BP's are good, euvolemic on exam but is 3-4kg up by wts.  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 No esa needs. Follow.  MBD ckd - CCa and phos in range. Cont po vdra and renvela as binder      Vinson Moselle  MD [facility] 04/11/2023, 6:14 PM  Recent Labs  Lab 04/10/23 0246 04/11/23 0537  HGB 10.7* 11.7*  ALBUMIN 2.9* 3.1*  CALCIUM 8.5* 7.6*  PHOS 5.3*  --   CREATININE  7.78* 10.23*  K 3.7 4.0   Inpatient medications:  Chlorhexidine  Gluconate Cloth  6 each Topical Daily   cinacalcet  120 mg Oral QPM   [START ON 04/12/2023] feeding supplement (GLUCERNA SHAKE)  237 mL Oral BID BM   insulin aspart  0-6 Units Subcutaneous Q4H   insulin glargine-yfgn  5 Units Subcutaneous QHS   [START ON 04/12/2023] nutrition supplement (JUVEN)  1 packet Oral BID BM   sevelamer carbonate  4,000 mg Oral TID WC   sodium chloride flush  3 mL Intravenous Q12H    ceFEPime (MAXIPIME) IV 200 mL/hr at 04/11/23 1600   metronidazole Stopped (04/11/23 1049)   [START ON 04/12/2023] vancomycin     acetaminophen **OR** acetaminophen, fentaNYL (SUBLIMAZE) injection, naLOXone (NARCAN)  injection, sevelamer carbonate

## 2023-04-11 NOTE — Inpatient Diabetes Management (Signed)
Inpatient Diabetes Program Recommendations  AACE/ADA: New Consensus Statement on Inpatient Glycemic Control (2015)  Target Ranges:  Prepandial:   less than 140 mg/dL      Peak postprandial:   less than 180 mg/dL (1-2 hours)      Critically ill patients:  140 - 180 mg/dL   Lab Results  Component Value Date   GLUCAP 278 (H) 04/11/2023   HGBA1C 9.5 11/11/2022    Review of Glycemic Control  Latest Reference Range & Units 04/10/23 07:26 04/10/23 12:11 04/10/23 16:13 04/10/23 21:16 04/11/23 04:32 04/11/23 09:37  Glucose-Capillary 70 - 99 mg/dL 161 (H) 096 (H) 045 (H) 313 (H) 269 (H) 278 (H)   Diabetes history: DM 2 Outpatient Diabetes medications: 70/30 10 units bid, Novolin R 12 units tid meal coverage  Current orders for Inpatient glycemic control:  [place] 5 units  Novolog 0-6 units Q4 hours  Inpatient Diabetes Program Recommendations:    -   Increase Semglee to 12 units -   obtain A1c level if not already drawn  Thanks,  Christena Deem RN, MSN, BC-ADM Inpatient Diabetes Coordinator Team Pager [phone] (8a-5p)

## 2023-04-12 ENCOUNTER — Inpatient Hospital Stay (HOSPITAL_COMMUNITY): Payer: Medicare HMO

## 2023-04-12 ENCOUNTER — Other Ambulatory Visit (HOSPITAL_COMMUNITY): Payer: Medicare HMO

## 2023-04-12 DIAGNOSIS — E1022 Type 1 diabetes mellitus with diabetic chronic kidney disease: Secondary | ICD-10-CM | POA: Diagnosis not present

## 2023-04-12 DIAGNOSIS — D696 Thrombocytopenia, unspecified: Secondary | ICD-10-CM | POA: Diagnosis not present

## 2023-04-12 DIAGNOSIS — L97509 Non-pressure chronic ulcer of other part of unspecified foot with unspecified severity: Secondary | ICD-10-CM | POA: Diagnosis not present

## 2023-04-12 DIAGNOSIS — E1122 Type 2 diabetes mellitus with diabetic chronic kidney disease: Secondary | ICD-10-CM | POA: Diagnosis not present

## 2023-04-12 DIAGNOSIS — E11621 Type 2 diabetes mellitus with foot ulcer: Secondary | ICD-10-CM | POA: Diagnosis not present

## 2023-04-12 DIAGNOSIS — R7989 Other specified abnormal findings of blood chemistry: Secondary | ICD-10-CM | POA: Diagnosis not present

## 2023-04-12 DIAGNOSIS — N186 End stage renal disease: Secondary | ICD-10-CM | POA: Diagnosis not present

## 2023-04-12 HISTORY — PX: IR REMOVAL TUN CV CATH W/O FL: IMG2289

## 2023-04-12 LAB — COMPREHENSIVE METABOLIC PANEL
ALT: 38 U/L (ref 0–44)
AST: 25 U/L (ref 15–41)
Albumin: 2.9 g/dL — ABNORMAL LOW (ref 3.5–5.0)
Alkaline Phosphatase: 151 U/L — ABNORMAL HIGH (ref 38–126)
Anion gap: 21 — ABNORMAL HIGH (ref 5–15)
BUN: 66 mg/dL — ABNORMAL HIGH (ref 6–20)
CO2: 14 mmol/L — ABNORMAL LOW (ref 22–32)
Calcium: 7.3 mg/dL — ABNORMAL LOW (ref 8.9–10.3)
Chloride: 90 mmol/L — ABNORMAL LOW (ref 98–111)
Creatinine, Ser: 10.91 mg/dL — ABNORMAL HIGH (ref 0.61–1.24)
GFR, Estimated: 5 mL/min — ABNORMAL LOW (ref >60)
Glucose, Bld: 347 mg/dL — ABNORMAL HIGH (ref 70–99)
Potassium: 4.6 mmol/L (ref 3.5–5.1)
Sodium: 125 mmol/L — ABNORMAL LOW (ref 135–145)
Total Bilirubin: 1.5 mg/dL — ABNORMAL HIGH (ref 0.3–1.2)
Total Protein: 6.6 g/dL (ref 6.5–8.1)

## 2023-04-12 LAB — CULTURE, BLOOD (ROUTINE X 2): Special Requests: ADEQUATE

## 2023-04-12 LAB — GLUCOSE, CAPILLARY
Glucose-Capillary: 112 mg/dL — ABNORMAL HIGH (ref 70–99)
Glucose-Capillary: 266 mg/dL — ABNORMAL HIGH (ref 70–99)
Glucose-Capillary: 336 mg/dL — ABNORMAL HIGH (ref 70–99)

## 2023-04-12 LAB — MAGNESIUM: Magnesium: 2.5 mg/dL — ABNORMAL HIGH (ref 1.7–2.4)

## 2023-04-12 LAB — HEMOGLOBIN A1C
Hgb A1c MFr Bld: 8.7 % — ABNORMAL HIGH (ref 4.8–5.6)
Mean Plasma Glucose: 202.99 mg/dL

## 2023-04-12 LAB — CBC
HCT: 32.9 % — ABNORMAL LOW (ref 39.0–52.0)
Hemoglobin: 10.5 g/dL — ABNORMAL LOW (ref 13.0–17.0)
MCH: 29.1 pg (ref 26.0–34.0)
MCHC: 31.9 g/dL (ref 30.0–36.0)
MCV: 91.1 fL (ref 80.0–100.0)
Platelets: 144 10*3/uL — ABNORMAL LOW (ref 150–400)
RBC: 3.61 MIL/uL — ABNORMAL LOW (ref 4.22–5.81)
RDW: 14.3 % (ref 11.5–15.5)
WBC: 10.4 10*3/uL (ref 4.0–10.5)
nRBC: 0 % (ref 0.0–0.2)

## 2023-04-12 MED ORDER — LIDOCAINE HCL 1 % IJ SOLN
20.0000 mL | Freq: Once | INTRAMUSCULAR | Status: AC
  Administered 2023-04-12: 10 mL via INTRADERMAL
  Filled 2023-04-12: qty 20

## 2023-04-12 MED ORDER — LIDOCAINE HCL 1 % IJ SOLN
INTRAMUSCULAR | Status: AC
  Filled 2023-04-12: qty 20

## 2023-04-12 NOTE — Procedures (Signed)
Interventional Radiology Procedure Note  Risks and benefits of removal of hemodialysis catheter were discussed with the patient including, but not limited to bleeding, hematoma, infection, damage to adjacent structures.  All of the patient's questions were answered, patient is agreeable to proceed. Consent signed and in chart. PROCEDURE SUMMARY:  Successful removal of tunneled dialysis catheter.  No complications.   EBL = trace  Please see full dictation in imaging section of Epic for procedure details.  Patient states that he is leaving the hospital after the HD removal with the plan to return in 2 days for ongoing treatment and possible tunneled HD catheter placement. Confirmed with hospitalists Dr. Galen Manila that the Patient is correct in his understanding. Team is requesting tunneled HD catheter removal at this time.   Alene Mires NP 04/12/2023 1:42 PM

## 2023-04-12 NOTE — Progress Notes (Signed)
Vancomycin started later than ordered due to not having PIV.

## 2023-04-12 NOTE — Progress Notes (Addendum)
[facility]    Date of Admission:  04/09/2023   Total days of antibiotics 4           ID: [name] is a 42 y.o. male with  MRSA bacteremia Principal Problem:   Diabetic foot infection ([name]) Active Problems:   Insulin dependent type 1 diabetes mellitus ([name])   ESRD on dialysis ([name])   Elevated LFTs   Thrombocytopenia ([name])    Subjective:  Underwent HD and removal of HD line without difficutly, but then had fever up to 100.61F  Medications:   Chlorhexidine Gluconate Cloth  6 each Topical Daily   Chlorhexidine Gluconate Cloth  6 each Topical Q0600   cinacalcet  120 mg Oral QPM   feeding supplement (GLUCERNA SHAKE)  237 mL Oral BID BM   insulin aspart  0-6 Units Subcutaneous Q4H   insulin glargine-yfgn  5 Units Subcutaneous QHS   nutrition supplement (JUVEN)  1 packet Oral BID BM   sevelamer carbonate  4,000 mg Oral TID WC   sodium chloride flush  3 mL Intravenous Q12H    Objective: Vital signs in last 24 hours: Temp:  [98 F (36.7 C)-100.7 F (38.2 C)] 100.7 F (38.2 C) (07/15 1420) Pulse Rate:  [95-118] 113 (07/15 1420) Resp:  [13-29] 18 (07/15 1420) BP: (70-145)/(41-110) 124/52 (07/15 1420) SpO2:  [89 %-100 %] 100 % (07/15 1420) Weight:  [83.9 kg] 83.9 kg (07/15 0825)  Physical Exam  Constitutional: He is oriented to person, place, and time. He appears well-developed and well-nourished. No distress.  HENT:  Mouth/Throat: Oropharynx is clear and moist. No oropharyngeal exudate.  Cardiovascular: Normal rate, regular rhythm and normal heart sounds. Exam reveals no gallop and no friction rub.  No murmur heard.  Pulmonary/Chest: Effort normal and breath sounds normal. No respiratory distress. He has no wheezes.  JYN:WGNFA foot wound+/ left aka  Neurological: He is alert and oriented to person, place, and time.  Skin: Skin is warm and dry. No rash noted. No erythema.  Psychiatric: He has a normal mood and affect. His behavior is  normal.    Lab Results Recent Labs    04/11/23 0537 04/12/23 0145  WBC 13.9* 10.4  HGB 11.7* 10.5*  HCT 36.2* 32.9*  NA 126* 125*  K 4.0 4.6  CL 92* 90*  CO2 16* 14*  BUN 56* 66*  CREATININE 10.23* 10.91*   Liver Panel Recent Labs    04/11/23 0537 04/12/23 0145  PROT 7.2 6.6  ALBUMIN 3.1* 2.9*  AST 23 25  ALT 51* 38  ALKPHOS 176* 151*  BILITOT 1.2 1.5*   Sedimentation Rate Recent Labs    04/10/23 0246  ESRSEDRATE 46*   C-Reactive Protein Recent Labs    04/10/23 0246  CRP 22.3*    Microbiology: Blood cx 7/14 ngtd Studies/Results: IR Removal Tun Cv Cath W/O FL  Result Date: 04/12/2023 INDICATION: 42 year old male. History of end-stage renal disease on hemodialysis via a right IJ tunneled catheter. Team is requesting tunneled HD removal for line holiday EXAM: REMOVAL TUNNELED CENTRAL VENOUS CATHETER MEDICATIONS: No antibiotic was indicated for this procedure. Moderate (conscious) sedation was not employed during this procedure. FLUOROSCOPY TIME:  None COMPLICATIONS: None immediate. PROCEDURE: Informed written consent was obtained from the patient after a thorough discussion of the procedural risks, benefits and alternatives. All questions were addressed. Maximal Sterile Barrier Technique was utilized including caps, mask, sterile gowns, sterile gloves, sterile drape, hand hygiene and skin antiseptic. A timeout was  performed prior to the initiation of the procedure. The patient's right chest and catheter was prepped and draped in a normal sterile fashion. Heparin was removed from both ports of catheter. 1% lidocaine was used for local anesthesia. Using gentle blunt dissection the cuff of the catheter was exposed and the catheter was removed in it's entirety. Pressure was held till hemostasis was obtained. A sterile dressing was applied. The patient tolerated the procedure well with no immediate complications. IMPRESSION: Successful catheter removal as described above. Read  by: Anders Grant, NP Electronically Signed   By: Malachy Moan M.D.   On: 04/12/2023 14:17     Assessment/Plan: MRSA bacteremia = unclear if source is HD catheter vs right foot DFU. Awaiting mri of right foot. Continue on vancomycin with HD. Currently has line holiday, with plans to place new HD line on Wednesday at next HD sessions.  Plan to get TTE/TEE to rule out endocarditis  DFU = can change cefepime and metronidazole -- to augmentin until we have imaging to see if need further extended therapy-to treat as osteomyelitis  ESRD on HD = can continue on m-w-f schedule. New line to be placed on wednesday  [facility] for Infectious Diseases Pager: [phone]  04/12/2023, 4:11 PM

## 2023-04-12 NOTE — Plan of Care (Signed)
Patient refusing medications/therapies/services.

## 2023-04-12 NOTE — Progress Notes (Signed)
At bedside for PIV insertion. Attempted x 3 (with pt request. Pt stated," Go ahead and try the A/C.") PIV obtained in the RAC. Very poor vasculature. 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 Vessels split and unable to thread off. RN aware.

## 2023-04-12 NOTE — TOC CM/SW Note (Signed)
Transition of Care [facility]) - Inpatient Brief Assessment   Patient Details  Name: [name] MRN: [id number] Date of Birth: March 03, 1981  Transition of Care [facility]) CM/SW Contact:    Darrold Span, RN Phone Number: 04/12/2023, 2:40 PM   Clinical Narrative: Pt with hx ESRD- HD MWF, ID following need to remove hd line tomorrow after HD session, then line holiday.  We will continue to monitor patient advancement through interdisciplinary progression rounds. If new patient transition needs arise, please place a TOC consult.   Transition of Care Asessment: Insurance and Status: Insurance coverage has been reviewed Patient has primary care physician: Yes Home environment has been reviewed: home Prior level of function:: disabled - hx left AKA Prior/Current Home Services: No current home services Social Determinants of Health Reivew: SDOH reviewed no interventions necessary Readmission risk has been reviewed: Yes Transition of care needs: no transition of care needs at this time

## 2023-04-12 NOTE — Progress Notes (Signed)
   04/12/23 1243  Vitals  Pulse Rate (!) 102  Resp 20  BP 121/89  SpO2 100 %  Post Treatment  Dialyzer Clearance Clear  Duration of HD Treatment -hour(s) 3.25 hour(s)  Hemodialysis Intake (mL) 0 mL  Liters Processed 80.5  Fluid Removed (mL) 3800 mL  Tolerated HD Treatment Yes   Received patient in bed to unit.  Alert and oriented.  Informed consent signed and in chart.   TX duration:3.5hr  Patient tolerated well.  Transported back to the room  Alert, without acute distress.  Hand-off given to patient's nurse.   Access used: [facility] Access issues: none  Total UF removed: 3.8L Medication(s) given: pain med    [facility]

## 2023-04-12 NOTE — Progress Notes (Addendum)
PROGRESS NOTE    [name]  ZOX:[id number] DOB: 1981/05/09 DOA: 04/09/2023 PCP: [name], NP    Brief Narrative:    [name] is a 42 y.o. male with medical history significant for hypertension, insulin-dependent diabetes mellitus, ESRD on hemodialysis, and a left AKA who presented to the emergency department with fever, chills, and elevated heart rate. Patient has a foot wound at the plantar aspect of his right forefoot . In the ED, patient had temperature of 99.9 F, mildly tachycardic but normal BP. Chest x-ray was negative for acute findings and respiratory virus panel was negative.  Lactic acid was normal.  WBC was elevated to 17,400, total bilirubin is 2.1, alkaline phosphatase 197, AST 78, and ALT 86. Podiatry (Dr. [name] Bores) was consulted, blood cultures were collected, and the patient was given 500 mL of LR, vancomycin, and cefepime.  Patient was then admitted hospital for further evaluation and treatment.  During hospitalization, patient has been seen by podiatry, infectious disease and nephrology during hospitalization..  Infectious disease recommended removal of hemodialysis catheter for line holiday and underwent removal on 04/12/23 after Hemodialysis.  Assessment/Plan    Right Diabetic foot ulcer.   Podiatry and infectious disease on board.  Plan for MRI of the foot to rule out any underlying infection abscess that is still pending.  Currently on vancomycin, Flagyl and cefepime. ABI shows noncompressible right lower extremity arteries. X-ray of the foot without acute findings.   MRSA bacteremia, on broad spectrum antibiotics including vancomycin.  ID on board who recommend removal of hemodialysis catheter and line holiday.  post hemodialysis catheter was removed today.  Repeat blood cultures from 04/11/2023 after 48 hours of initiation of antibiotic is negative for last 24 hours.  ESRD Monday Wednesday Friday. Underwent hemodialysis today.  Will need hemodialysis  Monday Wednesday Friday.  He will have to get catheter as outpatient for hemodialysis continuation.  He is indicating to leave today despite explaining the process.   Insulin-dependent DM with hyperglycemia. Hemoglobin A1c was 9.5% in February 2024.  Continue sliding scale insulin, Accu-Cheks, diabetic diet.  Elevated LFTs  Acute hepatitis panel negative.  Diarrhea. GI pathogen panel and c diff negative.  Continue supportive care.   Thrombocytopenia   Platelets 11.7 K on admission.  Platelets of 144 today.  No evidence of bleeding.  Chronic.  Chronic hyponatremia.  Patient is on hemodialysis ESRD.  Continue to monitor.  Asymptomatic.    DVT prophylaxis: SCDs Start: 04/09/23 2237   Code Status:     Code Status: Full Code  Disposition: Home likely in 2 to 3 days.  Status is: Inpatient  Remains inpatient appropriate because: MRSA bacteremia, IV antibiotic, follow-up blood cultures,    Family Communication: None at bedside.  Consultants:  Infectious disease Podiatry Nephrology  Procedures:  Removal of right internal jugular tunneled catheter on 04/12/2023 Hemodialysis.  Antimicrobials:  Cefepime, vancomycin, Flagyl  Anti-infectives (From admission, onward)    Start     Dose/Rate Route Frequency Ordered Stop   04/12/23 1200  vancomycin (VANCOCIN) IVPB 1000 mg/200 mL premix        1,000 mg 200 mL/hr over 60 Minutes Intravenous Every M-W-F (Hemodialysis) 04/10/23 1310 04/16/23 2359   04/10/23 2000  ceFEPIme (MAXIPIME) 1 g in sodium chloride 0.9 % 100 mL IVPB        1 g 200 mL/hr over 30 Minutes Intravenous Every 24 hours 04/09/23 2244 04/16/23 1959   04/09/23 2300  vancomycin (VANCOREADY) IVPB 750 mg/150 mL  750 mg 150 mL/hr over 60 Minutes Intravenous  Once 04/09/23 2246 04/10/23 0306   04/09/23 2245  metroNIDAZOLE (FLAGYL) IVPB 500 mg        500 mg 100 mL/hr over 60 Minutes Intravenous Every 12 hours 04/09/23 2237 04/16/23 2244   04/09/23 2244  vancomycin  variable dose per unstable renal function (pharmacist dosing)  Status:  Discontinued         Does not apply See admin instructions 04/09/23 2244 04/10/23 1311   04/09/23 1930  vancomycin (VANCOCIN) IVPB 1000 mg/200 mL premix        1,000 mg 200 mL/hr over 60 Minutes Intravenous  Once 04/09/23 1922 04/09/23 2222   04/09/23 1930  ceFEPIme (MAXIPIME) 1 g in sodium chloride 0.9 % 100 mL IVPB        1 g 200 mL/hr over 30 Minutes Intravenous  Once 04/09/23 1922 04/09/23 2324       Subjective: Today, patient was seen and examined at bedside.  States that he wishes to go home after removing the hemodialysis catheter.  Explained to him about the risk of leaving AGAINST MEDICAL ADVICE especially with MRSA bacteremia.  He understands his risk but thinking about leaving despite multiple providers talking to him.  Seen during hemodialysis.  Objective: Vitals:   04/12/23 1235 04/12/23 1237 04/12/23 1243 04/12/23 1420  BP: (S) (!) 70/48 113/66 121/89 (!) 124/52  Pulse: (!) 108 (!) 110 (!) 102 (!) 113  Resp:  20 20 18   Temp:    (!) 100.7 F (38.2 C)  TempSrc:    Oral  SpO2: 93% 93% 100% 100%  Weight:      Height:        Intake/Output Summary (Last 24 hours) at 04/12/2023 1632 Last data filed at 04/12/2023 1243 Gross per 24 hour  Intake --  Output 3800 ml  Net -3800 ml   Filed Weights   04/09/23 1706 04/12/23 0825  Weight: 83.9 kg 83.9 kg    Physical Examination: Body mass index is 28.97 kg/m.   General:  Average built, not in obvious distress HENT:   No scleral pallor or icterus noted. Oral mucosa is moist.  Chest:.  Right chest wall hemodialysis catheter in place.  Seen prior to removal. CVS: S1 &S2 heard. No murmur.  Regular rate and rhythm. Abdomen: Soft, nontender, nondistended.  Bowel sounds are heard.   Extremities: Left above-knee amputation.  Right foot ulcer with hyperkeratosis, Psych: Alert, awake and oriented, normal mood CNS:  No cranial nerve deficits.  Skin: Warm and  dry.  Right foot ulcer with hyperkeratosis.  Left above-knee amputation.  Data Reviewed:   CBC: Recent Labs  Lab 04/09/23 1815 04/10/23 0246 04/11/23 0537 04/12/23 0145  WBC 17.4* 18.2* 13.9* 10.4  NEUTROABS 15.6*  --   --   --   HGB 11.0* 10.7* 11.7* 10.5*  HCT 33.8* 34.0* 36.2* 32.9*  MCV 89.2 93.4 90.0 91.1  PLT 111* 106* 146* 144*    Basic Metabolic Panel: Recent Labs  Lab 04/09/23 1815 04/10/23 0246 04/11/23 0537 04/12/23 0145  NA 134* 132* 126* 125*  K 3.8 3.7 4.0 4.6  CL 97* 97* 92* 90*  CO2 20* 16* 16* 14*  GLUCOSE 192* 132* 278* 347*  BUN 30* 37* 56* 66*  CREATININE 6.96* 7.78* 10.23* 10.91*  CALCIUM 8.9 8.5* 7.6* 7.3*  MG  --  2.3 2.5* 2.5*  PHOS  --  5.3*  --   --     Liver Function Tests: Recent  Labs  Lab 04/09/23 1815 04/10/23 0246 04/11/23 0537 04/12/23 0145  AST 78* 56* 23 25  ALT 86* 67* 51* 38  ALKPHOS 197* 168* 176* 151*  BILITOT 2.1* 1.6* 1.2 1.5*  PROT 7.4 6.5 7.2 6.6  ALBUMIN 3.2* 2.9* 3.1* 2.9*     Radiology Studies: IR Removal Tun Cv Cath W/O FL  Result Date: 04/12/2023 INDICATION: 42 year old male. History of end-stage renal disease on hemodialysis via a right IJ tunneled catheter. Team is requesting tunneled HD removal for line holiday EXAM: REMOVAL TUNNELED CENTRAL VENOUS CATHETER MEDICATIONS: No antibiotic was indicated for this procedure. Moderate (conscious) sedation was not employed during this procedure. FLUOROSCOPY TIME:  None COMPLICATIONS: None immediate. PROCEDURE: Informed written consent was obtained from the patient after a thorough discussion of the procedural risks, benefits and alternatives. All questions were addressed. Maximal Sterile Barrier Technique was utilized including caps, mask, sterile gowns, sterile gloves, sterile drape, hand hygiene and skin antiseptic. A timeout was performed prior to the initiation of the procedure. The patient's right chest and catheter was prepped and draped in a normal sterile  fashion. Heparin was removed from both ports of catheter. 1% lidocaine was used for local anesthesia. Using gentle blunt dissection the cuff of the catheter was exposed and the catheter was removed in it's entirety. Pressure was held till hemostasis was obtained. A sterile dressing was applied. The patient tolerated the procedure well with no immediate complications. IMPRESSION: Successful catheter removal as described above. Read by: Anders Grant, NP Electronically Signed   By: Malachy Moan M.D.   On: 04/12/2023 14:17      LOS: 3 days    Joycelyn Das, MD [facility] Available via Epic secure chat 7am-7pm After these hours, please refer to coverage provider listed on [email or web address] 04/12/2023, 4:32 PM

## 2023-04-12 NOTE — Progress Notes (Signed)
[name] Progress Note    Assessment/ Plan:   MRSA bacteremia - w/ ^WBC and low-grade temps, improving. Will need line holiday, IR to remove after HD today. Then will need TDC replaced on Wednesday. Pt says he can't wait here until Wednesday, wants to leave and come back.  Hopefully he will end up staying but otherwise sounds like he will need to leave AMA and then present back to the ER on Wed On vanc. TTE pending. ID following R diabetic foot ulcer - per pmd ESRD - on HD MWF. Missed 5 sessions in June and 2 in July so far. HTN/ volume - BP's are good, euvolemic on exam but is 3-4kg up by wts.  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 No esa needs. Follow. 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 Cont po vdra and renvela as binder  OP HD: [facility] MWF  4h  400/1.5   80kg   2K/3Ca bath  RIJ TDC   Heparin none - in July missed HD on 7/3 and 7/10 - in June missed on 6/5, 6/12, 6/19, 6/24 and 6/28 - last OP HD was 7/12, post wt 83kg (+3kg post) - rocaltrol 1.5 mcg po mwf - no esa, last Hb 11 on 7/12  Subjective:   Patient seen and examined on HD. Tolerating treatment. Open for [facility] removal with IR post HD. He is ADAMANT about leaving after catheter removal, "not up for negotiation". Finances are an issue as per patient. He reports that he will come back on Wed. Discussed with primary service.   Objective:   BP (!) 144/72   Pulse (!) 115   Temp 98.6 F (37 C)   Resp 18   Ht 5\' 7"  (1.702 m)   Wt 83.9 kg   SpO2 93%   BMI 28.97 kg/m   Intake/Output Summary (Last 24 hours) at 04/12/2023 1034 Last data filed at 04/11/2023 1600 Gross per 24 hour  Intake 310.11 ml  Output --  Net 310.11 ml   Weight change:   Physical Exam: Gen: NAD CVS: RRR Resp: normal WOB Abd: ND Ext: no sig edema, lt aka Neuro: awake, alert Dialysis access: RIJ [facility]  Imaging: VAS [place] ABI WITH/WO TBI  Result Date: 04/11/2023  LOWER EXTREMITY DOPPLER STUDY Patient Name:  [name]  Date of Exam:    04/10/2023 Medical Rec #: [id number]      Accession #:    [id number] Date of Birth: 1981-01-28     Patient Gender: M Patient Age:   42 years Exam Location:  [facility] Procedure:      VAS [place] ABI WITH/WO TBI Referring Phys: TIMOTHY OPYD --------------------------------------------------------------------------------  Indications: Ulceration. High Risk Factors: Hypertension, Diabetes. 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 Comparison Study: Prior ABI done 05/22/21 Performing Technologist: Sherren Kerns RVS  Examination Guidelines: A complete evaluation includes at minimum, Doppler waveform signals and systolic blood pressure reading at the level of bilateral brachial, anterior tibial, and posterior tibial arteries, when vessel segments are accessible. Bilateral testing is considered an integral part of a complete examination. Photoelectric Plethysmograph (PPG) waveforms and toe systolic pressure readings are included as required and additional duplex testing as needed. Limited examinations for reoccurring indications may be performed as noted.  ABI Findings: +---------+------------------+-----+---------+--------+ Right    Rt Pressure (mmHg)IndexWaveform Comment  +---------+------------------+-----+---------+--------+ Brachial 170  triphasic         +---------+------------------+-----+---------+--------+ PTA      253               1.49 triphasic         +---------+------------------+-----+---------+--------+ DP       162               0.95 triphasic         +---------+------------------+-----+---------+--------+ Great Toe100               0.59                   +---------+------------------+-----+---------+--------+ +--------+------------------+-----+---------+-------+ Left    Lt Pressure (mmHg)IndexWaveform Comment +--------+------------------+-----+---------+-------+ [id number]                    triphasicAKA      +--------+------------------+-----+---------+-------+ +-------+-------------+-----------+----------------+------------+ ABI/TBIToday's ABI  Today's TBIPrevious ABI    Previous TBI +-------+-------------+-----------+----------------+------------+ Right  1.49/non comp0.59       non compressible0.45         +-------+-------------+-----------+----------------+------------+ Left   AKA          AKA        AKA             AKA          +-------+-------------+-----------+----------------+------------+  Arterial wall calcification precludes accurate ankle pressures and ABIs. Bilateral ABIs and TBIs appear essentially unchanged compared to prior study on 05/22/21.  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 The right toe-brachial index is abnormal. *See table(s) above for measurements and observations.  Electronically signed by Gerarda Fraction on 04/11/2023 at 12:02:18 PM.    Final     Labs: BMET Recent Labs  Lab 04/09/23 1815 04/10/23 0246 04/11/23 0537 04/12/23 0145  NA 134* 132* 126* 125*  K 3.8 3.7 4.0 4.6  CL 97* 97* 92* 90*  CO2 20* 16* 16* 14*  GLUCOSE 192* 132* 278* 347*  BUN 30* 37* 56* 66*  CREATININE 6.96* 7.78* 10.23* 10.91*  CALCIUM 8.9 8.5* 7.6* 7.3*  PHOS  --  5.3*  --   --    CBC Recent Labs  Lab 04/09/23 1815 04/10/23 0246 04/11/23 0537 04/12/23 0145  WBC 17.4* 18.2* 13.9* 10.4  NEUTROABS 15.6*  --   --   --   HGB 11.0* 10.7* 11.7* 10.5*  HCT 33.8* 34.0* 36.2* 32.9*  MCV 89.2 93.4 90.0 91.1  PLT 111* 106* 146* 144*    Medications:     Chlorhexidine Gluconate Cloth  6 each Topical Daily   Chlorhexidine Gluconate Cloth  6 each Topical Q0600   cinacalcet  120 mg Oral QPM   feeding supplement (GLUCERNA SHAKE)  237 mL Oral BID BM   insulin aspart  0-6 Units Subcutaneous Q4H   insulin glargine-yfgn  5 Units Subcutaneous QHS   nutrition supplement (JUVEN)  1 packet Oral BID BM   sevelamer  carbonate  4,000 mg Oral TID WC   sodium chloride flush  3 mL Intravenous Q12H      Anthony Sar, MD [facility] 04/12/2023, 10:34 AM

## 2023-04-12 NOTE — Progress Notes (Signed)
Pt receives out-pt HD at [facility]) on MWF 12:05 chair time. Will assist as needed.   Olivia Canter Renal Navigator [phone]

## 2023-04-13 ENCOUNTER — Inpatient Hospital Stay (HOSPITAL_COMMUNITY): Payer: Medicare HMO

## 2023-04-13 ENCOUNTER — Other Ambulatory Visit (HOSPITAL_COMMUNITY): Payer: Medicare HMO

## 2023-04-13 DIAGNOSIS — L97509 Non-pressure chronic ulcer of other part of unspecified foot with unspecified severity: Secondary | ICD-10-CM | POA: Diagnosis not present

## 2023-04-13 DIAGNOSIS — L089 Local infection of the skin and subcutaneous tissue, unspecified: Secondary | ICD-10-CM | POA: Diagnosis not present

## 2023-04-13 DIAGNOSIS — E1122 Type 2 diabetes mellitus with diabetic chronic kidney disease: Secondary | ICD-10-CM | POA: Diagnosis not present

## 2023-04-13 DIAGNOSIS — N186 End stage renal disease: Secondary | ICD-10-CM | POA: Diagnosis not present

## 2023-04-13 DIAGNOSIS — E11628 Type 2 diabetes mellitus with other skin complications: Secondary | ICD-10-CM | POA: Diagnosis not present

## 2023-04-13 DIAGNOSIS — E11621 Type 2 diabetes mellitus with foot ulcer: Secondary | ICD-10-CM | POA: Diagnosis not present

## 2023-04-13 DIAGNOSIS — R7881 Bacteremia: Secondary | ICD-10-CM | POA: Diagnosis not present

## 2023-04-13 DIAGNOSIS — B9562 Methicillin resistant Staphylococcus aureus infection as the cause of diseases classified elsewhere: Secondary | ICD-10-CM

## 2023-04-13 LAB — GLUCOSE, CAPILLARY
Glucose-Capillary: 140 mg/dL — ABNORMAL HIGH (ref 70–99)
Glucose-Capillary: 231 mg/dL — ABNORMAL HIGH (ref 70–99)
Glucose-Capillary: 294 mg/dL — ABNORMAL HIGH (ref 70–99)
Glucose-Capillary: 349 mg/dL — ABNORMAL HIGH (ref 70–99)
Glucose-Capillary: 359 mg/dL — ABNORMAL HIGH (ref 70–99)

## 2023-04-13 LAB — BASIC METABOLIC PANEL
Anion gap: 16 — ABNORMAL HIGH (ref 5–15)
BUN: 36 mg/dL — ABNORMAL HIGH (ref 6–20)
CO2: 20 mmol/L — ABNORMAL LOW (ref 22–32)
Calcium: 8.4 mg/dL — ABNORMAL LOW (ref 8.9–10.3)
Chloride: 95 mmol/L — ABNORMAL LOW (ref 98–111)
Creatinine, Ser: 8.14 mg/dL — ABNORMAL HIGH (ref 0.61–1.24)
GFR, Estimated: 8 mL/min — ABNORMAL LOW (ref >60)
Glucose, Bld: 246 mg/dL — ABNORMAL HIGH (ref 70–99)
Potassium: 3.6 mmol/L (ref 3.5–5.1)
Sodium: 131 mmol/L — ABNORMAL LOW (ref 135–145)

## 2023-04-13 LAB — CBC
HCT: 39.3 % (ref 39.0–52.0)
Hemoglobin: 12.6 g/dL — ABNORMAL LOW (ref 13.0–17.0)
MCH: 29.4 pg (ref 26.0–34.0)
MCHC: 32.1 g/dL (ref 30.0–36.0)
MCV: 91.6 fL (ref 80.0–100.0)
Platelets: 156 10*3/uL (ref 150–400)
RBC: 4.29 MIL/uL (ref 4.22–5.81)
RDW: 14.4 % (ref 11.5–15.5)
WBC: 9.2 10*3/uL (ref 4.0–10.5)
nRBC: 0 % (ref 0.0–0.2)

## 2023-04-13 LAB — HEPATITIS B SURFACE ANTIBODY, QUANTITATIVE: Hep B S AB Quant (Post): 22.2 m[IU]/mL

## 2023-04-13 LAB — MAGNESIUM: Magnesium: 2.3 mg/dL (ref 1.7–2.4)

## 2023-04-13 MED ORDER — SODIUM CHLORIDE 0.9 % IV SOLN
1.0000 g | INTRAVENOUS | Status: DC
  Filled 2023-04-13: qty 10

## 2023-04-13 MED ORDER — AMOXICILLIN-POT CLAVULANATE 500-125 MG PO TABS
1.0000 | ORAL_TABLET | Freq: Two times a day (BID) | ORAL | Status: DC
  Administered 2023-04-14 (×2): 1 via ORAL
  Filled 2023-04-13 (×3): qty 1

## 2023-04-13 MED ORDER — CEFAZOLIN SODIUM-DEXTROSE 2-4 GM/100ML-% IV SOLN
2.0000 g | Freq: Once | INTRAVENOUS | Status: DC
  Filled 2023-04-13: qty 100

## 2023-04-13 MED ORDER — METRONIDAZOLE 500 MG/100ML IV SOLN: 500.0000 mg | Freq: Two times a day (BID) | INTRAVENOUS | Status: DC

## 2023-04-13 MED ORDER — INSULIN GLARGINE-YFGN 100 UNIT/ML ~~LOC~~ SOLN
8.0000 [IU] | Freq: Every day | SUBCUTANEOUS | Status: DC
  Administered 2023-04-14: 8 [IU] via SUBCUTANEOUS
  Filled 2023-04-13 (×2): qty 0.08

## 2023-04-13 MED ORDER — VANCOMYCIN HCL IN DEXTROSE 1-5 GM/200ML-% IV SOLN
1000.0000 mg | INTRAVENOUS | Status: DC
  Administered 2023-04-14: 1000 mg via INTRAVENOUS
  Filled 2023-04-13: qty 200

## 2023-04-13 NOTE — Progress Notes (Signed)
PROGRESS NOTE    [name]  WUJ:[id number] DOB: 1981-07-18 DOA: 04/09/2023 PCP: [name], NP    Brief Narrative:  [name] is a 42 y.o. male with medical history significant for hypertension, insulin-dependent diabetes mellitus, ESRD on hemodialysis, and a left AKA who presented to the emergency department with fever, chills, and elevated heart rate. Patient has a foot wound at the plantar aspect of his right forefoot . In the ED, patient had temperature of 99.9 F, mildly tachycardic but normal BP. Chest x-ray was negative for acute findings and respiratory virus panel was negative.  Lactic acid was normal.  WBC was elevated to 17,400, total bilirubin is 2.1, alkaline phosphatase 197, AST 78, and ALT 86. Podiatry (Dr. [name] Bores) was consulted, blood cultures were collected, and the patient was given 500 mL of LR, vancomycin, and cefepime.  Patient was then admitted hospital for further evaluation and treatment.  During hospitalization, patient has been seen by podiatry, infectious disease and nephrology during hospitalization..  Infectious disease recommended removal of hemodialysis catheter for line holiday and underwent removal on 04/12/23 after Hemodialysis.  Assessment/Plan:   Right Diabetic foot ulcer.   Podiatry and infectious disease on board.  Patient underwent MRI of the foot which does not show any osteomyelitis or abscess. Patient is on broad-spectrum coverage with vancomycin cefepime and metronidazole. MRSA bacteremia was noted. Antibiotic management per infectious disease. ABI shows noncompressible right lower extremity arteries.    MRSA bacteremia Management per ID.  Cardiogram was ordered but patient refused. Hemodialysis catheter was removed on 7/15.  It appears that plan is to reinsert dialysis catheter tomorrow.   Repeat blood cultures from 7/14 without any growth so far.  ESRD Monday Wednesday Friday. Nephrology is following.  Dialysis catheter was  removed on 7/15.  Next Allises ends on 7/17.  Will need annual catheter placed prior to dialysis.     Insulin-dependent DM with hyperglycemia. Hemoglobin A1c was 9.5% in February 2024.  Continue sliding scale insulin, Accu-Cheks, diabetic diet. CBGs are poorly controlled.  Elevated LFTs  Acute hepatitis panel negative.  LFTs were noted to be normal on 7/15.  Diarrhea I pathogen panel and c diff negative.  Continue supportive care.   Thrombocytopenia  Resolved.    Chronic hyponatremia Stable  DVT prophylaxis: SCDs Start: 04/09/23 2237 Code Status: Full code Family communication: Discussed with patient.  No family at bedside. Disposition: Home likely in 2 to 3 days.   Consultants:  Infectious disease Podiatry Nephrology  Procedures:  Removal of right internal jugular tunneled catheter on 04/12/2023 Hemodialysis.  Antimicrobials:  Cefepime, vancomycin, Flagyl  Anti-infectives (From admission, onward)    Start     Dose/Rate Route Frequency Ordered Stop   04/14/23 1200  vancomycin (VANCOCIN) IVPB 1000 mg/200 mL premix        1,000 mg 200 mL/hr over 60 Minutes Intravenous Every M-W-F (Hemodialysis) 04/13/23 1017     04/13/23 2200  metroNIDAZOLE (FLAGYL) IVPB 500 mg        500 mg 100 mL/hr over 60 Minutes Intravenous Every 12 hours 04/13/23 1017     04/13/23 2000  ceFEPIme (MAXIPIME) 1 g in sodium chloride 0.9 % 100 mL IVPB        1 g 200 mL/hr over 30 Minutes Intravenous Every 24 hours 04/13/23 1017     04/12/23 1200  vancomycin (VANCOCIN) IVPB 1000 mg/200 mL premix  Status:  Discontinued        1,000 mg 200 mL/hr over 60 Minutes  Intravenous Every M-W-F (Hemodialysis) 04/10/23 1310 04/13/23 1017   04/10/23 2000  ceFEPIme (MAXIPIME) 1 g in sodium chloride 0.9 % 100 mL IVPB  Status:  Discontinued        1 g 200 mL/hr over 30 Minutes Intravenous Every 24 hours 04/09/23 2244 04/13/23 1017   04/09/23 2300  vancomycin (VANCOREADY) IVPB 750 mg/150 mL        750 mg 150 mL/hr  over 60 Minutes Intravenous  Once 04/09/23 2246 04/10/23 0306   04/09/23 2245  metroNIDAZOLE (FLAGYL) IVPB 500 mg  Status:  Discontinued        500 mg 100 mL/hr over 60 Minutes Intravenous Every 12 hours 04/09/23 2237 04/13/23 1017   04/09/23 2244  vancomycin variable dose per unstable renal function (pharmacist dosing)  Status:  Discontinued         Does not apply See admin instructions 04/09/23 2244 04/10/23 1311   04/09/23 1930  vancomycin (VANCOCIN) IVPB 1000 mg/200 mL premix        1,000 mg 200 mL/hr over 60 Minutes Intravenous  Once 04/09/23 1922 04/09/23 2222   04/09/23 1930  ceFEPIme (MAXIPIME) 1 g in sodium chloride 0.9 % 100 mL IVPB        1 g 200 mL/hr over 30 Minutes Intravenous  Once 04/09/23 1922 04/09/23 2324       Subjective: Patient denies any pain in the right foot.  No nausea vomiting.  Willing to stay another day to have dialysis catheter reinserted, hopefully by tomorrow.  Objective: Vitals:   04/12/23 1637 04/12/23 2342 04/13/23 0612 04/13/23 0743  BP: 138/84  (!) 114/97 (!) 135/96  Pulse: 73 (!) 108 (!) 110 98  Resp: 18 18 18 13   Temp: 98.8 F (37.1 C) 98 F (36.7 C) 98 F (36.7 C) 98 F (36.7 C)  TempSrc: Oral Oral Oral Oral  SpO2: 94%   95%  Weight:   78.6 kg   Height:        Intake/Output Summary (Last 24 hours) at 04/13/2023 1028 Last data filed at 04/13/2023 0615 Gross per 24 hour  Intake 854.61 ml  Output 3808 ml  Net -2953.39 ml   Filed Weights   04/09/23 1706 04/12/23 0825 04/13/23 0612  Weight: 83.9 kg 83.9 kg 78.6 kg    Physical Examination: General appearance: Awake alert.  In no distress Resp: Clear to auscultation bilaterally.  Normal effort Cardio: S1-S2 is normal regular.  No S3-S4.  No rubs murmurs or bruit GI: Abdomen is soft.  Nontender nondistended.  Bowel sounds are present normal.  No masses organomegaly Extremities: No edema.   Neurologic: Alert and oriented x3.  No focal neurological deficits.     Data Reviewed:    CBC: Recent Labs  Lab 04/09/23 1815 04/10/23 0246 04/11/23 0537 04/12/23 0145 04/13/23 0720  WBC 17.4* 18.2* 13.9* 10.4 9.2  NEUTROABS 15.6*  --   --   --   --   HGB 11.0* 10.7* 11.7* 10.5* 12.6*  HCT 33.8* 34.0* 36.2* 32.9* 39.3  MCV 89.2 93.4 90.0 91.1 91.6  PLT 111* 106* 146* 144* 156    Basic Metabolic Panel: Recent Labs  Lab 04/09/23 1815 04/10/23 0246 04/11/23 0537 04/12/23 0145 04/13/23 0720  NA 134* 132* 126* 125* 131*  K 3.8 3.7 4.0 4.6 3.6  CL 97* 97* 92* 90* 95*  CO2 20* 16* 16* 14* 20*  GLUCOSE 192* 132* 278* 347* 246*  BUN 30* 37* 56* 66* 36*  CREATININE 6.96* 7.78* 10.23* 10.91*  8.14*  CALCIUM 8.9 8.5* 7.6* 7.3* 8.4*  MG  --  2.3 2.5* 2.5* 2.3  PHOS  --  5.3*  --   --   --     Liver Function Tests: Recent Labs  Lab 04/09/23 1815 04/10/23 0246 04/11/23 0537 04/12/23 0145  AST 78* 56* 23 25  ALT 86* 67* 51* 38  ALKPHOS 197* 168* 176* 151*  BILITOT 2.1* 1.6* 1.2 1.5*  PROT 7.4 6.5 7.2 6.6  ALBUMIN 3.2* 2.9* 3.1* 2.9*     Radiology Studies: MR FOOT RIGHT WO CONTRAST  Result Date: 04/13/2023 CLINICAL DATA:  Plantar midfoot ulcer. EXAM: MRI OF THE RIGHT FOREFOOT WITHOUT CONTRAST [name]: Multiplanar, multisequence MR imaging of the right forefoot was performed. No intravenous contrast was administered. COMPARISON:  Right foot x-rays dated April 10, 2023. MRI right foot dated Feb 02, 2018. FINDINGS: Bones/Joint/Cartilage No marrow signal abnormality. Prior fifth ray amputation. No fracture or dislocation. Joint spaces are preserved. No joint effusion. Ligaments Collateral ligaments are intact.  Lisfranc ligament is intact. Muscles and Tendons Flexor and extensor tendons are intact. No tenosynovitis. Complete fatty atrophy of the intrinsic foot muscles. Soft tissue No fluid collection or hematoma.  No soft tissue mass. IMPRESSION: 1. No evidence of osteomyelitis or abscess. Electronically Signed   By: Obie Dredge M.D.   On: 04/13/2023 09:24   IR  Removal Tun Cv Cath W/O FL  Result Date: 04/12/2023 INDICATION: 42 year old male. History of end-stage renal disease on hemodialysis via a right IJ tunneled catheter. Team is requesting tunneled HD removal for line holiday EXAM: REMOVAL TUNNELED CENTRAL VENOUS CATHETER MEDICATIONS: No antibiotic was indicated for this procedure. Moderate (conscious) sedation was not employed during this procedure. FLUOROSCOPY TIME:  None COMPLICATIONS: None immediate. PROCEDURE: Informed written consent was obtained from the patient after a thorough discussion of the procedural risks, benefits and alternatives. All questions were addressed. [name] was utilized including caps, mask, sterile gowns, sterile gloves, sterile drape, hand hygiene and skin antiseptic. A timeout was performed prior to the initiation of the procedure. The patient's right chest and catheter was prepped and draped in a normal sterile fashion. [name] was removed from both ports of catheter. 1% lidocaine was used for local anesthesia. Using gentle blunt dissection the cuff of the catheter was exposed and the catheter was removed in it's entirety. Pressure was held till hemostasis was obtained. A sterile dressing was applied. The patient tolerated the procedure well with no immediate complications. IMPRESSION: Successful catheter removal as described above. Read by: Anders Grant, NP Electronically Signed   By: Malachy Moan M.D.   On: 04/12/2023 14:17      LOS: 4 days    Shane Shipper, MD [facility] Available via Epic secure chat 7am-7pm After these hours, please refer to coverage provider listed on [email or web address] 04/13/2023, 10:28 AM

## 2023-04-13 NOTE — Progress Notes (Signed)
   PODIATRY progress note  NAME [name] MRN [id number] DOB Apr 15, 1981 DOA 04/09/2023    HPI: 42 y.o. male admitted for sepsis and worsening infection to the right forefoot.  He is doing okay.  No acute complaints.  No pain to the foot.   PLAN OF CARE -Patient seen this AM at bedside. -Antibiotics right mentation per infectious disease -MRi negative for any OM or abscess. Patient is okay to discharge from my standpoint. F/u in clinic as neeeded.  Follow-up with Dr. [name] Bores as needed     Latest Ref Rng & Units 04/13/2023    7:20 AM 04/12/2023    1:45 AM 04/11/2023    5:37 AM  CBC  WBC 4.0 - 10.5 K/uL 9.2  10.4  13.9   Hemoglobin 13.0 - 17.0 g/dL 55.7  32.2  02.5   Hematocrit 39.0 - 52.0 % 39.3  32.9  36.2   Platelets 150 - 400 K/uL 156  144  146        Latest Ref Rng & Units 04/13/2023    7:20 AM 04/12/2023    1:45 AM 04/11/2023    5:37 AM  BMP  Glucose 70 - 99 mg/dL 427  062  376   BUN 6 - 20 mg/dL 36  66  56   Creatinine 0.61 - 1.24 mg/dL 2.83  15.17  61.60   Sodium 135 - 145 mmol/L 131  125  126   Potassium 3.5 - 5.1 mmol/L 3.6  4.6  4.0   Chloride 98 - 111 mmol/L 95  90  92   CO2 22 - 32 mmol/L 20  14  16    Calcium 8.9 - 10.3 mg/dL 8.4  7.3  7.6

## 2023-04-13 NOTE — Consult Note (Signed)
Chief Complaint: Bacteremia. TDC removed for line holiday. Team is requesting a tunneled hemodialysis catheter placement  Referring Physician(s): L. Penninger PA  Supervising Physician: Ruel Favors  Patient Status: [facility] - In-pt  History of Present Illness: [name] is a 42 y.o. male inpatient. History of HTN, DM,  left AKA, ESRD on HD. Presented to the ED at [facility] on 7.12.24. Found to have a diabetic foot ulcer and MRSA bacteremia. Per Team request IR removed the RIJ HD catheter on 7.15.24 for a line holiday. Team is requesting a tunneled HD catheter placement.  Currently without any significant complaints. Patient alert and laying in bed,calm. Just woke up. Denies any fevers, headache, chest pain, SOB, cough, abdominal pain, nausea, vomiting or bleeding. Return precautions and treatment recommendations and follow-up discussed with the patient  who is agreeable with the plan.    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 Filetted toe flap metatarsal resection. Debridement Plantar Foot wound;  Surgeon: Park Liter, DPM;  Location: [facility] OR;  Service: Podiatry;  Laterality: Right;   AMPUTATION Left 08/20/2018   Procedure: FIFTH METATARSAL BONE BIOPSY;  Surgeon: Park Liter, DPM;  Location: [facility] OR;  Service: Podiatry;  Laterality: Left;   AMPUTATION Left 10/28/2018   Procedure: LEFT GREAT TOE AMPUTATION;  Surgeon: Park Liter, DPM;  Location: [facility] OR;  Service: Podiatry;  Laterality: Left;    AMPUTATION Left 09/16/2020   Procedure: AMPUTATION BELOW KNEE;  Surgeon: Larina Earthly, MD;  Location: [facility] OR;  Service: Vascular;  Laterality: Left;   AMPUTATION Left 10/15/2020   Procedure: AMPUTATION ABOVE KNEE;  Surgeon: Sherren Kerns, MD;  Location: [facility] OR;  Service: Vascular;  Laterality: Left;   APPLICATION OF WOUND VAC  02/02/2018   Procedure: APPLICATION OF WOUND VAC  Right Foot;  Surgeon: Park Liter, DPM;  Location: [facility] OR;  Service: Podiatry;;   APPLICATION OF WOUND VAC Left 10/28/2018   Procedure: APPLICATION OF WOUND VAC LEFT TOE;  Surgeon: Park Liter, DPM;  Location: [facility] OR;  Service: Podiatry;  Laterality: Left;   APPLICATION OF WOUND VAC Left 11/01/2018   Procedure: APPLICATION OF WOUND VAC;  Surgeon: Park Liter, DPM;  Location: [facility] OR;  Service: Podiatry;  Laterality: Left;   AV FISTULA PLACEMENT     left arm.   AV FISTULA PLACEMENT Right 12/22/2016   Procedure: INSERTION OF ARTERIOVENOUS (AV) GORE-TEX GRAFT ARM;  Surgeon: Sherren Kerns, MD;  Location: [facility] OR;  Service: Vascular;  Laterality: Right;   AV FISTULA PLACEMENT Left 05/26/2018   Procedure: INSERTION OF  ARTERIOVENOUS (AV) GORE-TEX GRAFT LEFT ARM;  Surgeon: Nada Libman, MD;  Location: [facility] OR;  Service: Vascular;  Laterality: Left;   EYE SURGERY     I & D EXTREMITY Right 10/31/2017   Procedure: IRRIGATION AND DEBRIDEMENT RIGHT FOOT;  Surgeon: Park Liter, DPM;  Location: [facility] OR;  Service: Podiatry;  Laterality: Right;  I & D EXTREMITY Left 08/20/2018   Procedure: IRRIGATION AND DEBRIDEMENT EXTREMITY WITH SECONDARY WOUND CLOSUREAND APPLICATION OF WOUND VAC LEFT FOOT;  Surgeon: Park Liter, DPM;  Location: [facility] OR;  Service: Podiatry;  Laterality: Left;   I & D EXTREMITY Left 10/20/2018   Procedure: IRRIGATION AND DEBRIDEMENT LEFT FOOT  DEBRIDEMENT LATERAL FOOT WOUND;  Surgeon: Park Liter, DPM;  Location: [facility] OR;  Service: Podiatry;  Laterality: Left;   I & D EXTREMITY Left 10/28/2018    Procedure: IRRIGATION AND DEBRIDEMENT LEFT TOE;  Surgeon: Park Liter, DPM;  Location: [facility] OR;  Service: Podiatry;  Laterality: Left;   I & D EXTREMITY Left 09/14/2020   Procedure: IRRIGATION AND DEBRIDEMENT WRIST;  Surgeon: Betha Loa, MD;  Location: [facility] OR;  Service: Orthopedics;  Laterality: Left;   INSERTION OF DIALYSIS CATHETER     Right subclavian   IR AV DIALY SHUNT INTRO NEEDLE/INTRACATH INITIAL W/PTA/IMG RIGHT Right 02/05/2018   IR FLUORO GUIDE CV LINE RIGHT  01/31/2020   IR FLUORO GUIDE CV LINE RIGHT  01/30/2022   IR PTA VENOUS EXCEPT DIALYSIS CIRCUIT  01/30/2022   IR REMOVAL TUN CV CATH W/O FL  04/12/2023   IR THROMBECTOMY AV FISTULA W/THROMBOLYSIS/PTA INC/SHUNT/IMG LEFT Left 08/24/2018   IR THROMBECTOMY AV FISTULA W/THROMBOLYSIS/PTA INC/SHUNT/IMG LEFT Left 01/06/2019   IR US GUIDE VASC ACCESS LEFT  08/24/2018   IR US GUIDE VASC ACCESS RIGHT  02/05/2018   IR US GUIDE VASC ACCESS RIGHT  01/31/2020   IR VENOCAVAGRAM SVC  01/30/2022   IRRIGATION AND DEBRIDEMENT FOOT Right 10/23/2018   Procedure: Irrigation and Debridement to tendon, Left Foot;  Surgeon: Park Liter, DPM;  Location: [facility] OR;  Service: Podiatry;  Laterality: Right;   IRRIGATION AND DEBRIDEMENT FOOT Left 11/01/2018   Procedure: IRRIGATION AND DEBRIDEMENT PARTIAL WOUND CLOSURE LOCAL TISSUE TRANSFER AND FLAP ROTATION, LEFT FOOT;  Surgeon: Park Liter, DPM;  Location: [facility] OR;  Service: Podiatry;  Laterality: Left;   TRANSMETATARSAL AMPUTATION N/A 08/18/2018   Procedure: IRRIGATION AND DEBRIDEMENT OF LEFT 5TH TOE AND TRANSMETATARSAL, WITH PARTICAL LEFT 5TH TOE AND METATARSAL AMPUTATION, BONE BIOPSY, WOUND VAC APPLICATION.;  Surgeon: Park Liter, DPM;  Location: [facility] OR;  Service: Podiatry;  Laterality: N/A;   UPPER EXTREMITY VENOGRAPHY N/A 11/16/2016   Procedure: Upper Extremity Venography - Right Central;  Surgeon: Sherren Kerns, MD;  Location: [facility] INVASIVE CV LAB;  Service: Cardiovascular;  Laterality: N/A;   UPPER  EXTREMITY VENOGRAPHY N/A 05/25/2018   Procedure: UPPER EXTREMITY VENOGRAPHY - Bilateral;  Surgeon: Cephus Shelling, MD;  Location: [facility] INVASIVE CV LAB;  Service: Cardiovascular;  Laterality: N/A;    Allergies: Patient has no known allergies.  Medications: Prior to Admission medications   Medication Sig Start Date End Date Taking? Authorizing Provider  calcitRIOL (ROCALTROL) 0.5 MCG capsule Take 3 capsules (1.5 mcg total) by mouth every Monday, Wednesday, and Friday with hemodialysis. 05/23/21  Yes Kathlen Mody, MD  cinacalcet (SENSIPAR) 60 MG tablet Take 120 mg by mouth every evening. 04/10/22  Yes [provider]  gabapentin (NEURONTIN) 100 MG capsule Take 1 capsule (100 mg total) by mouth 3 (three) times daily. 02/17/23  Yes [name], NP  glucose blood (ONETOUCH VERIO) test strip USE 1 STRIP TO CHECK GLUCOSE THREE TIMES DAILY AS DIRECTED 03/22/23  Yes [name], NP  insulin NPH-regular Human (NOVOLIN 70/30) (70-30) 100 UNIT/ML injection Inject 10 Units into the skin 2 (two) times daily with a meal. 12/18/22  Yes Gwinda Passe P, NP  insulin regular (NOVOLIN R RELION) 100 units/mL injection INJECT 12 UNITS SUBCUTANEOUSLY THREE TIMES DAILY BEFORE MEAL(S) 12/18/22  Yes [name], NP  multivitamin (RENA-VIT) TABS tablet Take 1 tablet by mouth at bedtime. 05/23/21  Yes Kathlen Mody, MD  sevelamer carbonate (RENVELA) 800 MG tablet Take 2,400-4,000 mg by mouth See admin instructions. Take 4,000 mg (5 tablets) by mouth with meals and 2,400 mg (3 tablets) with snacks 04/27/19  Yes [provider]     Family History  Problem Relation Age of Onset   Diabetes Mellitus II Other    Diabetes Father    Renal Disease Father        ESRD    Social History   Socioeconomic History   Marital status: Single    Spouse name: Not on file   Number of children: Not on file   Years of education: Not on file   Highest education level: Not on file   Occupational History   Not on file  Tobacco Use   Smoking status: Never   Smokeless tobacco: Never  Vaping Use   Vaping status: Never Used  Substance and Sexual Activity   Alcohol use: No   Drug use: No   Sexual activity: Never  Other Topics Concern   Not on file  Social History Narrative   Not on file   Social Determinants of Health   Financial Resource Strain: Low Risk  (02/15/2023)   Overall Financial Resource Strain (CARDIA)    Difficulty of Paying Living Expenses: Not hard at all  Food Insecurity: No Food Insecurity (02/15/2023)   Hunger Vital Sign    Worried About Running Out of Food in the Last Year: Never true    Ran Out of Food in the Last Year: Never true  Transportation Needs: No Transportation Needs (02/15/2023)   PRAPARE - Administrator, Civil Service (Medical): No    Lack of Transportation (Non-Medical): No  Physical Activity: Inactive (02/15/2023)   Exercise Vital Sign    Days of Exercise per Week: 0 days    Minutes of Exercise per Session: 0 min  Stress: No Stress Concern Present (02/15/2023)   [employer] of Occupational Health - Occupational Stress Questionnaire    Feeling of Stress : Not at all  Social Connections: Socially Integrated (02/15/2023)   Social Connection and Isolation Panel [NHANES]    Frequency of Communication with Friends and Family: More than three times a week    Frequency of Social Gatherings with Friends and Family: More than three times a week    Attends Religious Services: More than 4 times per year    Active Member of [employer] or Organizations: Yes    Attends Engineer, structural: More than 4 times per year    Marital Status: Married      Review of Systems: A 12 point ROS discussed and pertinent positives are indicated in the HPI above.  All other systems are negative.  Review of Systems  Constitutional:  Negative for fever.  HENT:  Negative for congestion.   Respiratory:  Negative for cough and  shortness of breath.   Cardiovascular:  Negative for chest pain.  Gastrointestinal:  Negative for abdominal pain.  Neurological:  Negative for headaches.  Psychiatric/Behavioral:  Negative for behavioral problems and confusion.     Vital Signs: BP (!) 127/90 (BP Location: Left Arm)   Pulse 84   Temp 97.9 F (36.6 C) (Oral)   Resp  18   Ht 5\' 7"  (1.702 m)   Wt 173 lb 4.5 oz (78.6 kg)   SpO2 98%   BMI 27.14 kg/m     Physical Exam Vitals and nursing note reviewed.  Constitutional:      Appearance: He is well-developed.  HENT:     Head: Normocephalic.  Pulmonary:     Effort: Pulmonary effort is normal.  Musculoskeletal:        General: Normal range of motion.     Cervical back: Normal range of motion.  Skin:    General: Skin is warm and dry.  Neurological:     General: No focal deficit present.     Mental Status: He is alert and oriented to person, place, and time.  Psychiatric:        Mood and Affect: Mood normal.        Behavior: Behavior normal.        Thought Content: Thought content normal.        Judgment: Judgment normal.     Imaging: MR FOOT RIGHT WO CONTRAST  Result Date: 04/13/2023 CLINICAL DATA:  Plantar midfoot ulcer. EXAM: MRI OF THE RIGHT FOREFOOT WITHOUT CONTRAST TECHNIQUE: Multiplanar, multisequence MR imaging of the right forefoot was performed. No intravenous contrast was administered. COMPARISON:  Right foot x-rays dated April 10, 2023. MRI right foot dated Feb 02, 2018. FINDINGS: Bones/Joint/Cartilage No marrow signal abnormality. Prior fifth ray amputation. No fracture or dislocation. Joint spaces are preserved. No joint effusion. Ligaments Collateral ligaments are intact.  Lisfranc ligament is intact. Muscles and Tendons Flexor and extensor tendons are intact. No tenosynovitis. Complete fatty atrophy of the intrinsic foot muscles. Soft tissue No fluid collection or hematoma.  No soft tissue mass. IMPRESSION: 1. No evidence of osteomyelitis or abscess.  Electronically Signed   By: Obie Dredge M.D.   On: 04/13/2023 09:24   IR Removal Tun Cv Cath W/O FL  Result Date: 04/12/2023 INDICATION: 42 year old male. History of end-stage renal disease on hemodialysis via a right IJ tunneled catheter. Team is requesting tunneled HD removal for line holiday EXAM: REMOVAL TUNNELED CENTRAL VENOUS CATHETER MEDICATIONS: No antibiotic was indicated for this procedure. Moderate (conscious) sedation was not employed during this procedure. FLUOROSCOPY TIME:  None COMPLICATIONS: None immediate. PROCEDURE: Informed written consent was obtained from the patient after a thorough discussion of the procedural risks, benefits and alternatives. All questions were addressed. Maximal Sterile Barrier Technique was utilized including caps, mask, sterile gowns, sterile gloves, sterile drape, hand hygiene and skin antiseptic. A timeout was performed prior to the initiation of the procedure. The patient's right chest and catheter was prepped and draped in a normal sterile fashion. Heparin was removed from both ports of catheter. 1% lidocaine was used for local anesthesia. Using gentle blunt dissection the cuff of the catheter was exposed and the catheter was removed in it's entirety. Pressure was held till hemostasis was obtained. A sterile dressing was applied. The patient tolerated the procedure well with no immediate complications. IMPRESSION: Successful catheter removal as described above. Read by: Anders Grant, NP Electronically Signed   By: Malachy Moan M.D.   On: 04/12/2023 14:17   VAS [place] ABI WITH/WO TBI  Result Date: 04/11/2023  LOWER EXTREMITY DOPPLER STUDY Patient Name:  [name]  Date of Exam:   04/10/2023 Medical Rec #: [id number]      Accession #:    [id number] Date of Birth: [birth year]     Patient Gender: M Patient Age:   89  years Exam Location:  [facility] Procedure:      VAS [place] ABI WITH/WO TBI Referring Phys: TIMOTHY OPYD  --------------------------------------------------------------------------------  Indications: Ulceration. High Risk Factors: Hypertension, Diabetes. 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 Comparison Study: Prior ABI done 05/22/21 Performing Technologist: Sherren Kerns RVS  Examination Guidelines: A complete evaluation includes at minimum, Doppler waveform signals and systolic blood pressure reading at the level of bilateral brachial, anterior tibial, and posterior tibial arteries, when vessel segments are accessible. Bilateral testing is considered an integral part of a complete examination. Photoelectric Plethysmograph (PPG) waveforms and toe systolic pressure readings are included as required and additional duplex testing as needed. Limited examinations for reoccurring indications may be performed as noted.  ABI Findings: +---------+------------------+-----+---------+--------+ Right    Rt Pressure (mmHg)IndexWaveform Comment  +---------+------------------+-----+---------+--------+ Brachial 170                    triphasic         +---------+------------------+-----+---------+--------+ PTA      253               1.49 triphasic         +---------+------------------+-----+---------+--------+ DP       162               0.95 triphasic         +---------+------------------+-----+---------+--------+ Great Toe100               0.59                   +---------+------------------+-----+---------+--------+ +--------+------------------+-----+---------+-------+ Left    Lt Pressure (mmHg)IndexWaveform Comment +--------+------------------+-----+---------+-------+ [id number]                    triphasicAKA     +--------+------------------+-----+---------+-------+ +-------+-------------+-----------+----------------+------------+ ABI/TBIToday's ABI  Today's TBIPrevious ABI    Previous TBI  +-------+-------------+-----------+----------------+------------+ Right  1.49/non comp0.59       non compressible0.45         +-------+-------------+-----------+----------------+------------+ Left   AKA          AKA        AKA             AKA          +-------+-------------+-----------+----------------+------------+  Arterial wall calcification precludes accurate ankle pressures and ABIs. Bilateral ABIs and TBIs appear essentially unchanged compared to prior study on 05/22/21.  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 The right toe-brachial index is abnormal. *See table(s) above for measurements and observations.  Electronically signed by Gerarda Fraction on 04/11/2023 at 12:02:18 PM.    Final    DG Foot Complete Right  Result Date: 04/10/2023 CLINICAL DATA:  Right foot infection. EXAM: RIGHT FOOT COMPLETE - 3+ VIEW COMPARISON:  Right foot x-rays dated December 12, 2019. FINDINGS: No bony destruction or periosteal reaction. 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 No acute fracture or dislocation. Joint spaces are preserved. Bone mineralization is normal. IMPRESSION: 1. No acute osseous abnormality. Electronically Signed   By: Obie Dredge M.D.   On: 04/10/2023 09:13   US Abdomen Limited RUQ (LIVER/GB)  Result Date: 04/10/2023 CLINICAL DATA:  Elevated liver function tests. EXAM: ULTRASOUND ABDOMEN LIMITED RIGHT UPPER QUADRANT COMPARISON:  None Available. FINDINGS: Gallbladder: No gallstones or wall thickening visualized (2.4 mm). No sonographic Murphy sign noted by sonographer. Common bile duct: Diameter: 3.6 mm Liver: No focal lesion identified. Diffusely increased echogenicity of the liver parenchyma is noted.  Portal vein is patent on color Doppler imaging with normal direction of blood flow towards the liver. Other: None. IMPRESSION: Hepatic steatosis without focal liver lesions. Electronically Signed   By: Aram Candela M.D.   On: 04/10/2023 01:40   DG Chest Portable 1 View  Result Date: 04/09/2023 CLINICAL DATA:  Fever and cough. EXAM: PORTABLE CHEST 1 VIEW COMPARISON:  April 17, 2022 FINDINGS: Large-bore right central venous catheter stable. Cardiomediastinal silhouette is normal. Mediastinal contours appear intact. There is no evidence of focal airspace consolidation, pleural effusion or pneumothorax. Osseous structures are without acute abnormality. Soft tissues are grossly normal. IMPRESSION: No active disease. Electronically Signed   By: Ted Mcalpine M.D.   On: 04/09/2023 18:41    Labs:  CBC: Recent Labs    04/10/23 0246 04/11/23 0537 04/12/23 0145 04/13/23 0720  WBC 18.2* 13.9* 10.4 9.2  HGB 10.7* 11.7* 10.5* 12.6*  HCT 34.0* 36.2* 32.9* 39.3  PLT 106* 146* 144* 156    COAGS: No results for input(s): "INR", "APTT" in the last 8760 hours.  BMP: Recent Labs    04/10/23 0246 04/11/23 0537 04/12/23 0145 04/13/23 0720  NA 132* 126* 125* 131*  K 3.7 4.0 4.6 3.6  CL 97* 92* 90* 95*  CO2 16* 16* 14* 20*  GLUCOSE 132* 278* 347* 246*  BUN 37* 56* 66* 36*  CALCIUM 8.5* 7.6* 7.3* 8.4*  CREATININE 7.78* 10.23* 10.91* 8.14*  GFRNONAA 8* 6* 5* 8*    LIVER FUNCTION TESTS: Recent Labs    04/09/23 1815 04/10/23 0246 04/11/23 0537 04/12/23 0145  BILITOT 2.1* 1.6* 1.2 1.5*  AST 78* 56* 23 25  ALT 86* 67* 51* 38  ALKPHOS 197* 168* 176* 151*  PROT 7.4 6.5 7.2 6.6  ALBUMIN 3.2* 2.9* 3.1* 2.9*     Assessment and Plan:  42 y.o. male inpatient. History of HTN, DM,  left AKA, ESRD on HD. Presented [facility] ED at [facility] on 7.12.24. Found to have a diabetic foot ulcer and MRSA bacteremia. Per Team request IR removed the RIJ HD catheter on 7.15.24 for a line holiday. Team is requesting a tunneled HD catheter placement.   WBC 9.2. Blood culture positive on staph aureus. All other labs and medications are within acceptable parameters. NKDA.   IR consulted for possible tunneled HD  catheter placement. Should patient still be deemed bacteremic but in need of urgent dialysis access a temp HD catheter will be placed.  Patient tentatively scheduled for 7.17.24.  Team instructed to: Keep Patient to be NPO after midnight IR will call patient when ready.  Risks and benefits discussed with the patient including, but not limited to bleeding, infection, vascular injury, pneumothorax which may require chest tube placement, air embolism or even death  All of the patient's questions were answered, patient is agreeable to proceed. Consent signed and in chart.   Thank you for this interesting consult.  I greatly enjoyed meeting [name] and look forward to participating in their care.  A copy of this report was sent to the requesting provider on this date.  Electronically Signed: Alene Mires, NP 04/13/2023, 1:19 PM   I spent a total of 20 Minutes    in face to face in clinical consultation, greater than 50% of which was counseling/coordinating care for [facility] placement

## 2023-04-13 NOTE — Inpatient Diabetes Management (Signed)
Inpatient Diabetes Program Recommendations  AACE/ADA: New Consensus Statement on Inpatient Glycemic Control (2015)  Target Ranges:  Prepandial:   less than 140 mg/dL      Peak postprandial:   less than 180 mg/dL (1-2 hours)      Critically ill patients:  140 - 180 mg/dL   Lab Results  Component Value Date   GLUCAP 349 (H) 04/13/2023   HGBA1C 8.7 (H) 04/12/2023    Review of Glycemic Control  Latest Reference Range & Units 04/12/23 14:16 04/12/23 16:35 04/12/23 20:23 04/13/23 00:06 04/13/23 06:09 04/13/23 11:31  Glucose-Capillary 70 - 99 mg/dL 742 (H) 595 (H) 638 (H) 294 (H) 231 (H) 349 (H)   Diabetes history: DM 2 Outpatient Diabetes medications: 70/30 10 units bid, Novolin R 12 units tid meal coverage  Current orders for Inpatient glycemic control:  Semlgee 8 units  Novolog 0-6 units Q4 hours  Inpatient Diabetes Program Recommendations:   Consider changing Novolog correction to tid with meals and HS (instead of q 4 hours) and add Novolog 3 units tid with meals (hold if patient eats less than 50% or NPO).   Thanks,  Beryl Meager, RN, BC-ADM Inpatient Diabetes Coordinator Pager [phone]  (8a-5p)

## 2023-04-13 NOTE — Care Management Important Message (Signed)
Important Message  Patient Details  Name: [name] MRN: [id number] Date of Birth: 03-23-1981   Medicare Important Message Given:  Yes     Renie Ora 04/13/2023, 8:27 AM

## 2023-04-13 NOTE — Progress Notes (Addendum)
[facility]    Date of Admission:  04/09/2023   Total days of antibiotics 5           ID: [name] is a 42 y.o. male with MRSA bacteremia Principal Problem:   Diabetic foot infection ([name]) Active Problems:   Insulin dependent type 1 diabetes mellitus ([name])   ESRD on dialysis ([name])   Elevated LFTs   Thrombocytopenia ([name])    Subjective: Feeling fatigued from long day yesterday where he underwent HD, line removal and had mri of foot.   Had fever isolated to 101F  Repeat blood cx NGTD Mri excluded osteomyelitis  Medications:   Chlorhexidine Gluconate Cloth  6 each Topical Daily   cinacalcet  120 mg Oral QPM   feeding supplement (GLUCERNA SHAKE)  237 mL Oral BID BM   insulin aspart  0-6 Units Subcutaneous Q4H   insulin glargine-yfgn  8 Units Subcutaneous QHS   nutrition supplement (JUVEN)  1 packet Oral BID BM   sevelamer carbonate  4,000 mg Oral TID WC   sodium chloride flush  3 mL Intravenous Q12H    Objective: Vital signs in last 24 hours: Temp:  [97.9 F (36.6 C)-100.7 F (38.2 C)] 97.9 F (36.6 C) (07/16 1145) Pulse Rate:  [73-113] 84 (07/16 1145) Resp:  [13-20] 18 (07/16 1145) BP: (70-138)/(48-97) 127/90 (07/16 1145) SpO2:  [93 %-100 %] 98 % (07/16 1145) Weight:  [78.6 kg] 78.6 kg (07/16 0612) Physical Exam  Constitutional: He is oriented to person, place, and time. He appears well-developed and well-nourished. No distress.  HENT:  Mouth/Throat: Oropharynx is clear and moist. No oropharyngeal exudate.  Cardiovascular: Normal rate, regular rhythm and normal heart sounds. Exam reveals no gallop and no friction rub.  No murmur heard.  Pulmonary/Chest: Effort normal and breath sounds normal. No respiratory distress. Chest wall - bandaged Abdominal: Soft. Bowel sounds are normal. He exhibits no distension. There is no tenderness.  UJW:JXBJY - ulcer of right foot Neurological: He is alert and oriented to person, place, and time.   Skin: Skin is warm and dry. No rash noted. No erythema.  Psychiatric: He has a normal mood and affect. His behavior is normal.    Lab Results Recent Labs    04/12/23 0145 04/13/23 0720  WBC 10.4 9.2  HGB 10.5* 12.6*  HCT 32.9* 39.3  NA 125* 131*  K 4.6 3.6  CL 90* 95*  CO2 14* 20*  BUN 66* 36*  CREATININE 10.91* 8.14*   Liver Panel Recent Labs    04/11/23 0537 04/12/23 0145  PROT 7.2 6.6  ALBUMIN 3.1* 2.9*  AST 23 25  ALT 51* 38  ALKPHOS 176* 151*  BILITOT 1.2 1.5*   Lab Results  Component Value Date   ESRSEDRATE 46 (H) 04/10/2023    Microbiology: Susceptibility   Methicillin resistant staphylococcus aureus    MIC    CIPROFLOXACIN >=8 RESISTANT Resistant    CLINDAMYCIN <=0.25 SENS... Sensitive    ERYTHROMYCIN >=8 RESISTANT Resistant    GENTAMICIN <=0.5 SENSI... Sensitive    Inducible Clindamycin NEGATIVE Sensitive    LINEZOLID 2 SENSITIVE Sensitive    OXACILLIN >=4 RESISTANT Resistant    RIFAMPIN <=0.5 SENSI... Sensitive    TETRACYCLINE <=1 SENSITIVE Sensitive    TRIMETH/SULFA <=10 SENSIT... Sensitive    VANCOMYCIN <=0.5 SENSI... Sensitive        Studies/Results: MR FOOT RIGHT WO CONTRAST  Result Date: 04/13/2023 CLINICAL DATA:  Plantar midfoot ulcer. EXAM: MRI OF THE  RIGHT FOREFOOT WITHOUT CONTRAST TECHNIQUE: Multiplanar, multisequence MR imaging of the right forefoot was performed. No intravenous contrast was administered. COMPARISON:  Right foot x-rays dated April 10, 2023. MRI right foot dated Feb 02, 2018. FINDINGS: Bones/Joint/Cartilage No marrow signal abnormality. Prior fifth ray amputation. No fracture or dislocation. Joint spaces are preserved. No joint effusion. Ligaments Collateral ligaments are intact.  Lisfranc ligament is intact. Muscles and Tendons Flexor and extensor tendons are intact. No tenosynovitis. Complete fatty atrophy of the intrinsic foot muscles. Soft tissue No fluid collection or hematoma.  No soft tissue mass. IMPRESSION: 1. No  evidence of osteomyelitis or abscess. Electronically Signed   By: Obie Dredge M.D.   On: 04/13/2023 09:24   IR Removal Tun Cv Cath W/O FL  Result Date: 04/12/2023 INDICATION: 42 year old male. History of end-stage renal disease on hemodialysis via a right IJ tunneled catheter. Team is requesting tunneled HD removal for line holiday EXAM: REMOVAL TUNNELED CENTRAL VENOUS CATHETER MEDICATIONS: No antibiotic was indicated for this procedure. Moderate (conscious) sedation was not employed during this procedure. FLUOROSCOPY TIME:  None COMPLICATIONS: None immediate. PROCEDURE: Informed written consent was obtained from the patient after a thorough discussion of the procedural risks, benefits and alternatives. All questions were addressed. Maximal Sterile Barrier Technique was utilized including caps, mask, sterile gowns, sterile gloves, sterile drape, hand hygiene and skin antiseptic. A timeout was performed prior to the initiation of the procedure. The patient's right chest and catheter was prepped and draped in a normal sterile fashion. Heparin was removed from both ports of catheter. 1% lidocaine was used for local anesthesia. Using gentle blunt dissection the cuff of the catheter was exposed and the catheter was removed in it's entirety. Pressure was held till hemostasis was obtained. A sterile dressing was applied. The patient tolerated the procedure well with no immediate complications. IMPRESSION: Successful catheter removal as described above. Read by: Anders Grant, NP Electronically Signed   By: Malachy Moan M.D.   On: 04/12/2023 14:17     Assessment/Plan:  MRSA bacteremia = bacteremia cleared quickly. Still continue on vancomycin. Hd line removed, now on line holiday til tomorrow. Plan to treat with vancomycin for 4 wks as complicated bacteremia. If blood cx still negative tomorrow am, can get new hd line placed  Agreeable to getting TTE today  Diabetic foot ulcer = will d/c cefepime  and metronidazole. Finish with 6 days of augmentin. MRI ruled out osteomyelitis. Plan to follow up with DFU right foot --with dr [name] Bores at outpatient.  Esrd on hd = continue with m-w-f dialysis schedule  [facility] Pager: [phone]  04/13/2023, 12:11 PM

## 2023-04-13 NOTE — Progress Notes (Signed)
Patient is on bedside commode at 2:30 and requests to come back in 45 minutes. We will not have time for echo after that time so we will re-attempt in am.  [facility]

## 2023-04-13 NOTE — Progress Notes (Signed)
[facility] Progress Note    Assessment/ Plan:   MRSA bacteremia - w/ ^WBC and low-grade temps, improving. ID following. TDC removed 7/15. Hopefully can be replaced tomorrow.  On vanc. TTE pending. ID following R diabetic foot ulcer - per pmd ESRD - on HD MWF. Missed 5 sessions in June and 2 in July so far. HD tomorrow after [facility] placed with IR HTN/ volume - BP's are good, euvolemic on exam but is 3-4kg up by wts.  Anemia esrd - Hb acceptable here. No esa needs. Follow. 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 Cont po vdra and renvela as binder  OP HD: [facility] MWF  4h  400/1.5   80kg   2K/3Ca bath  RIJ TDC   Heparin none - in July missed HD on 7/3 and 7/10 - in June missed on 6/5, 6/12, 6/19, 6/24 and 6/28 - last OP HD was 7/12, post wt 83kg (+3kg post) - rocaltrol 1.5 mcg po mwf - no esa, last Hb 11 on 7/12  Subjective:   Patient seen and examined bedside. Toelrated HD yesterday with net UF 3.8L. TDC removed. Didn't end up leaving AMA which is great. He reports that he just feels sleepy today. No other complaints.   Objective:   BP (!) 127/90 (BP Location: Left Arm)   Pulse 84   Temp 97.9 F (36.6 C) (Oral)   Resp 18   Ht 5\' 7"  (1.702 m)   Wt 78.6 kg   SpO2 98%   BMI 27.14 kg/m   Intake/Output Summary (Last 24 hours) at 04/13/2023 1456 Last data filed at 04/13/2023 0615 Gross per 24 hour  Intake 854.61 ml  Output 8 ml  Net 846.61 ml   Weight change:   Physical Exam: Gen: NAD, laying flat in bed CVS: RRR Resp: normal WOB Abd: ND Ext: no sig edema, lt aka Neuro: awake, alert Dialysis access: none  Imaging: MR FOOT RIGHT WO CONTRAST  Result Date: 04/13/2023 CLINICAL DATA:  Plantar midfoot ulcer. EXAM: MRI OF THE RIGHT FOREFOOT WITHOUT CONTRAST TECHNIQUE: Multiplanar, multisequence MR imaging of the right forefoot was performed. No intravenous contrast was administered. COMPARISON:  Right foot x-rays dated April 10, 2023. MRI right foot dated Feb 02, 2018. FINDINGS: Bones/Joint/Cartilage No marrow signal abnormality. Prior fifth ray amputation. No fracture or dislocation. Joint spaces are preserved. No joint effusion. Ligaments Collateral ligaments are intact.  Lisfranc ligament is intact. Muscles and Tendons Flexor and extensor tendons are intact. No tenosynovitis. Complete fatty atrophy of the intrinsic foot muscles. Soft tissue No fluid collection or hematoma.  No soft tissue mass. IMPRESSION: 1. No evidence of osteomyelitis or abscess. Electronically Signed   By: Obie Dredge M.D.   On: 04/13/2023 09:24   IR Removal Tun Cv Cath W/O FL  Result Date: 04/12/2023 INDICATION: 42 year old male. History of end-stage renal disease on hemodialysis via a right IJ tunneled catheter. Team is requesting tunneled HD removal for line holiday EXAM: REMOVAL TUNNELED CENTRAL VENOUS CATHETER MEDICATIONS: No antibiotic was indicated for this procedure. Moderate (conscious) sedation was not employed during this procedure. FLUOROSCOPY TIME:  None COMPLICATIONS: None immediate. PROCEDURE: Informed written consent was obtained from the patient after a thorough discussion of the procedural risks, benefits and alternatives. All questions were addressed. Maximal Sterile Barrier Technique was utilized including caps, mask, sterile gowns, sterile gloves, sterile drape, hand hygiene and skin antiseptic. A timeout was performed prior to the initiation of the procedure. The patient's right chest  and catheter was prepped and draped in a normal sterile fashion. Heparin was removed from both ports of catheter. 1% lidocaine was used for local anesthesia. Using gentle blunt dissection the cuff of the catheter was exposed and the catheter was removed in it's entirety. Pressure was held till hemostasis was obtained. A sterile dressing was applied. The patient tolerated the procedure well with no immediate complications. IMPRESSION: Successful catheter removal as described above. Read by:  Anders Grant, NP Electronically Signed   By: Malachy Moan M.D.   On: 04/12/2023 14:17    Labs: BMET Recent Labs  Lab 04/09/23 1815 04/10/23 0246 04/11/23 0537 04/12/23 0145 04/13/23 0720  NA 134* 132* 126* 125* 131*  K 3.8 3.7 4.0 4.6 3.6  CL 97* 97* 92* 90* 95*  CO2 20* 16* 16* 14* 20*  GLUCOSE 192* 132* 278* 347* 246*  BUN 30* 37* 56* 66* 36*  CREATININE 6.96* 7.78* 10.23* 10.91* 8.14*  CALCIUM 8.9 8.5* 7.6* 7.3* 8.4*  PHOS  --  5.3*  --   --   --    CBC Recent Labs  Lab 04/09/23 1815 04/10/23 0246 04/11/23 0537 04/12/23 0145 04/13/23 0720  WBC 17.4* 18.2* 13.9* 10.4 9.2  NEUTROABS 15.6*  --   --   --   --   HGB 11.0* 10.7* 11.7* 10.5* 12.6*  HCT 33.8* 34.0* 36.2* 32.9* 39.3  MCV 89.2 93.4 90.0 91.1 91.6  PLT 111* 106* 146* 144* 156    Medications:     amoxicillin-clavulanate  1 tablet Oral BID   Chlorhexidine Gluconate Cloth  6 each Topical Daily   cinacalcet  120 mg Oral QPM   feeding supplement (GLUCERNA SHAKE)  237 mL Oral BID BM   insulin aspart  0-6 Units Subcutaneous Q4H   insulin glargine-yfgn  8 Units Subcutaneous QHS   nutrition supplement (JUVEN)  1 packet Oral BID BM   sevelamer carbonate  4,000 mg Oral TID WC   sodium chloride flush  3 mL Intravenous Q12H      Anthony Sar, MD [facility] 04/13/2023, 2:56 PM

## 2023-04-14 ENCOUNTER — Inpatient Hospital Stay (HOSPITAL_COMMUNITY): Payer: Medicare HMO

## 2023-04-14 DIAGNOSIS — D696 Thrombocytopenia, unspecified: Secondary | ICD-10-CM | POA: Diagnosis not present

## 2023-04-14 DIAGNOSIS — B9562 Methicillin resistant Staphylococcus aureus infection as the cause of diseases classified elsewhere: Secondary | ICD-10-CM | POA: Diagnosis not present

## 2023-04-14 DIAGNOSIS — I82C21 Chronic embolism and thrombosis of right internal jugular vein: Secondary | ICD-10-CM | POA: Diagnosis not present

## 2023-04-14 DIAGNOSIS — Z992 Dependence on renal dialysis: Secondary | ICD-10-CM | POA: Diagnosis not present

## 2023-04-14 DIAGNOSIS — R7881 Bacteremia: Secondary | ICD-10-CM | POA: Diagnosis not present

## 2023-04-14 DIAGNOSIS — D631 Anemia in chronic kidney disease: Secondary | ICD-10-CM | POA: Diagnosis not present

## 2023-04-14 DIAGNOSIS — L97509 Non-pressure chronic ulcer of other part of unspecified foot with unspecified severity: Secondary | ICD-10-CM | POA: Diagnosis not present

## 2023-04-14 DIAGNOSIS — N186 End stage renal disease: Secondary | ICD-10-CM | POA: Diagnosis not present

## 2023-04-14 DIAGNOSIS — N250 Renal osteodystrophy: Secondary | ICD-10-CM | POA: Diagnosis not present

## 2023-04-14 DIAGNOSIS — E11621 Type 2 diabetes mellitus with foot ulcer: Secondary | ICD-10-CM | POA: Diagnosis not present

## 2023-04-14 DIAGNOSIS — I120 Hypertensive chronic kidney disease with stage 5 chronic kidney disease or end stage renal disease: Secondary | ICD-10-CM | POA: Diagnosis not present

## 2023-04-14 DIAGNOSIS — E1122 Type 2 diabetes mellitus with diabetic chronic kidney disease: Secondary | ICD-10-CM | POA: Diagnosis not present

## 2023-04-14 HISTORY — PX: IR FLUORO GUIDE CV LINE LEFT: IMG2282

## 2023-04-14 HISTORY — PX: IR US GUIDE VASC ACCESS LEFT: IMG2389

## 2023-04-14 LAB — GLUCOSE, CAPILLARY
Glucose-Capillary: 263 mg/dL — ABNORMAL HIGH (ref 70–99)
Glucose-Capillary: 317 mg/dL — ABNORMAL HIGH (ref 70–99)
Glucose-Capillary: 348 mg/dL — ABNORMAL HIGH (ref 70–99)
Glucose-Capillary: 356 mg/dL — ABNORMAL HIGH (ref 70–99)
Glucose-Capillary: 476 mg/dL — ABNORMAL HIGH (ref 70–99)

## 2023-04-14 MED ORDER — HEPARIN SODIUM (PORCINE) 1000 UNIT/ML IJ SOLN
INTRAMUSCULAR | Status: AC
  Filled 2023-04-14: qty 10

## 2023-04-14 MED ORDER — LIDOCAINE-EPINEPHRINE 1 %-1:100000 IJ SOLN
INTRAMUSCULAR | Status: AC
  Filled 2023-04-14: qty 1

## 2023-04-14 MED ORDER — MIDAZOLAM HCL 2 MG/2ML IJ SOLN
INTRAMUSCULAR | Status: AC
  Filled 2023-04-14: qty 2

## 2023-04-14 MED ORDER — FENTANYL CITRATE (PF) 100 MCG/2ML IJ SOLN
INTRAMUSCULAR | Status: AC
  Filled 2023-04-14: qty 2

## 2023-04-14 MED ORDER — INSULIN ASPART 100 UNIT/ML IJ SOLN: 0.0000 [IU] | Freq: Every day | INTRAMUSCULAR | Status: DC

## 2023-04-14 MED ORDER — CEFAZOLIN SODIUM-DEXTROSE 2-4 GM/100ML-% IV SOLN
INTRAVENOUS | Status: AC | PRN
  Administered 2023-04-14: 2 g via INTRAVENOUS

## 2023-04-14 MED ORDER — FLUMAZENIL 0.5 MG/5ML IV SOLN
INTRAVENOUS | Status: AC
  Filled 2023-04-14: qty 5

## 2023-04-14 MED ORDER — FENTANYL CITRATE (PF) 100 MCG/2ML IJ SOLN
INTRAMUSCULAR | Status: AC | PRN
  Administered 2023-04-14 (×2): 25 ug via INTRAVENOUS

## 2023-04-14 MED ORDER — HEPARIN SODIUM (PORCINE) 1000 UNIT/ML IJ SOLN
INTRAMUSCULAR | Status: AC
  Administered 2023-04-14: 1000 [IU]
  Filled 2023-04-14: qty 5

## 2023-04-14 MED ORDER — CEFAZOLIN SODIUM-DEXTROSE 2-4 GM/100ML-% IV SOLN
INTRAVENOUS | Status: AC
  Filled 2023-04-14: qty 100

## 2023-04-14 MED ORDER — MIDAZOLAM HCL 2 MG/2ML IJ SOLN
INTRAMUSCULAR | Status: AC | PRN
  Administered 2023-04-14: 1 mg via INTRAVENOUS
  Administered 2023-04-14: .5 mg via INTRAVENOUS

## 2023-04-14 MED ORDER — INSULIN GLARGINE-YFGN 100 UNIT/ML ~~LOC~~ SOLN
10.0000 [IU] | Freq: Every day | SUBCUTANEOUS | Status: DC
  Filled 2023-04-14: qty 0.1

## 2023-04-14 MED ORDER — INSULIN ASPART 100 UNIT/ML IJ SOLN
0.0000 [IU] | Freq: Three times a day (TID) | INTRAMUSCULAR | Status: DC
  Administered 2023-04-14: 8 [IU] via SUBCUTANEOUS

## 2023-04-14 MED ORDER — INSULIN ASPART 100 UNIT/ML IJ SOLN
10.0000 [IU] | Freq: Once | INTRAMUSCULAR | Status: AC
  Administered 2023-04-14: 10 [IU] via SUBCUTANEOUS

## 2023-04-14 NOTE — Progress Notes (Signed)
Patient wants to leave the hospital,RN tried to educate the importance of his hospital stay but pt started being verbally aggressive ,MD notified,pt sign AMA

## 2023-04-14 NOTE — Progress Notes (Signed)
Outpatient antimicrobial plan:  Indication: MRSA bacteremia Regimen: vancomycin 1000mg  IV MWF with HD sessions  End date: 05/10/2023  No formal OPAT will be done as patient will receive antibiotics with dialysis. Nephrology team aware.  Thank you for allowing pharmacy to be a part of this patient's care.  Lendon Ka, PharmD Candidate 04/14/2023, 2:06 PM

## 2023-04-14 NOTE — Procedures (Signed)
 Pre-procedure Diagnosis: ESRD Post-procedure Diagnosis: Same  Successful placement of tunneled HD catheter with tips terminating within the superior aspect of the right atrium.    Complications: None Immediate EBL: Trace  The catheter is ready for immediate use.   Ronny Bacon, MD Pager #: [phone]

## 2023-04-14 NOTE — Procedures (Signed)
I was present at this dialysis session. I have reviewed the session itself and made appropriate changes.   Filed Weights   04/09/23 1706 04/12/23 0825 04/13/23 0612  Weight: 83.9 kg 83.9 kg 78.6 kg    Recent Labs  Lab 04/10/23 0246 04/11/23 0537 04/13/23 0720  NA 132*   < > 131*  K 3.7   < > 3.6  CL 97*   < > 95*  CO2 16*   < > 20*  GLUCOSE 132*   < > 246*  BUN 37*   < > 36*  CREATININE 7.78*   < > 8.14*  CALCIUM 8.5*   < > 8.4*  PHOS 5.3*  --   --    < > = values in this interval not displayed.    Recent Labs  Lab 04/09/23 1815 04/10/23 0246 04/11/23 0537 04/12/23 0145 04/13/23 0720  WBC 17.4*   < > 13.9* 10.4 9.2  NEUTROABS 15.6*  --   --   --   --   HGB 11.0*   < > 11.7* 10.5* 12.6*  HCT 33.8*   < > 36.2* 32.9* 39.3  MCV 89.2   < > 90.0 91.1 91.6  PLT 111*   < > 146* 144* 156   < > = values in this interval not displayed.    Scheduled Meds:  amoxicillin-clavulanate  1 tablet Oral BID   Chlorhexidine Gluconate Cloth  6 each Topical Daily   cinacalcet  120 mg Oral QPM   feeding supplement (GLUCERNA SHAKE)  237 mL Oral BID BM   insulin aspart  0-15 Units Subcutaneous TID WC   insulin aspart  0-5 Units Subcutaneous QHS   insulin aspart  10 Units Subcutaneous Once   insulin glargine-yfgn  10 Units Subcutaneous QHS   nutrition supplement (JUVEN)  1 packet Oral BID BM   sevelamer carbonate  4,000 mg Oral TID WC   sodium chloride flush  3 mL Intravenous Q12H   Continuous Infusions:  vancomycin     PRN Meds:.acetaminophen **OR** acetaminophen, fentaNYL (SUBLIMAZE) injection, naLOXone (NARCAN)  injection, sevelamer carbonate   Anthony Sar, MD [facility] 04/14/2023, 2:42 PM

## 2023-04-14 NOTE — Progress Notes (Signed)
   04/14/23 1646  Vitals  Temp 98.4 F (36.9 C)  Pulse Rate 98  Resp 16  BP 110/83  SpO2 100 %  O2 Device Room Air  Oxygen Therapy  Patient Activity (if Appropriate) In bed  Pulse Oximetry Type Continuous  Post Treatment  Dialyzer Clearance Clear  Duration of HD Treatment -hour(s) 1.48 hour(s)  Hemodialysis Intake (mL) 0 mL  Liters Processed 37.9  Fluid Removed (mL) 2200 mL  Tolerated HD Treatment Yes  Post-Hemodialysis Comments Pt. complaining about he had to use the bathroom and he wanted off the machine. VSS and report called to 4E bedside RN Susmita Rimal , Admin medication, UF not met   Received patient in bed to unit.  Alert and oriented.  Informed consent signed and in chart.   TX duration:1.46  Patient tolerated well.  Transported back to the room  Alert, without acute distress.  Hand-off given to patient's nurse.   Access used: Yes Access issues: No  Total UF removed: 2200 Medication(s) given: See MAR Post HD VS: See Above Grid Post HD weight: 77.6 kg   [facility]

## 2023-04-14 NOTE — Progress Notes (Signed)
Attempted echo again @ 2:26pm -- pt in dialysis. Will try again tomorrow

## 2023-04-14 NOTE — Progress Notes (Signed)
[facility] Progress Note   Subjective:   Patient seen and examined at bedside.  HD catheter inserted this AM.  Admits to soreness but otherwise feeling ok.  States he needs to leave the hospital so he can go back to work to pay his bills.  Denies fever, chills, CP, SOB, abdominal pain and n/v/d.   Objective Vitals:   04/14/23 1050 04/14/23 1055 04/14/23 1100 04/14/23 1116  BP: 127/65 126/70 129/63   Pulse: 88 91 90 90  Resp: 12 12 16 20   Temp:    97.6 F (36.4 C)  TempSrc:    Oral  SpO2: 99% 100% 98% 94%  Weight:      Height:       Physical Exam General:alert male in NAD Heart:RRR, no mrg Lungs:CTAB, nml WOB on RA Abdomen:soft, NTND Extremities:no LE edema, L AKA Dialysis Access: R [facility]   Filed Weights   04/09/23 1706 04/12/23 0825 04/13/23 0612  Weight: 83.9 kg 83.9 kg 78.6 kg    Intake/Output Summary (Last 24 hours) at 04/14/2023 1154 Last data filed at 04/14/2023 0000 Gross per 24 hour  Intake 500 ml  Output --  Net 500 ml    Additional Objective Labs: Basic Metabolic Panel: Recent Labs  Lab 04/10/23 0246 04/11/23 0537 04/12/23 0145 04/13/23 0720  NA 132* 126* 125* 131*  K 3.7 4.0 4.6 3.6  CL 97* 92* 90* 95*  CO2 16* 16* 14* 20*  GLUCOSE 132* 278* 347* 246*  BUN 37* 56* 66* 36*  CREATININE 7.78* 10.23* 10.91* 8.14*  CALCIUM 8.5* 7.6* 7.3* 8.4*  PHOS 5.3*  --   --   --    Liver Function Tests: Recent Labs  Lab 04/10/23 0246 04/11/23 0537 04/12/23 0145  AST 56* 23 25  ALT 67* 51* 38  ALKPHOS 168* 176* 151*  BILITOT 1.6* 1.2 1.5*  PROT 6.5 7.2 6.6  ALBUMIN 2.9* 3.1* 2.9*   CBC: Recent Labs  Lab 04/09/23 1815 04/10/23 0246 04/11/23 0537 04/12/23 0145 04/13/23 0720  WBC 17.4* 18.2* 13.9* 10.4 9.2  NEUTROABS 15.6*  --   --   --   --   HGB 11.0* 10.7* 11.7* 10.5* 12.6*  HCT 33.8* 34.0* 36.2* 32.9* 39.3  MCV 89.2 93.4 90.0 91.1 91.6  PLT 111* 106* 146* 144* 156   Blood Culture    Component Value Date/Time   SDES BLOOD  RIGHT ARM 04/11/2023 1629   SDES BLOOD LEFT HAND 04/11/2023 1629   SPECREQUEST  04/11/2023 1629    BOTTLES DRAWN AEROBIC AND ANAEROBIC Blood Culture adequate volume   SPECREQUEST  04/11/2023 1629    BOTTLES DRAWN AEROBIC AND ANAEROBIC Blood Culture adequate volume   CULT  04/11/2023 1629    NO GROWTH 3 DAYS Performed at [facility], [address] [place], [name] [address]    CULT  04/11/2023 1629    NO GROWTH 3 DAYS Performed at [facility], [address], [place], [name] [address]    REPTSTATUS PENDING 04/11/2023 1629   REPTSTATUS PENDING 04/11/2023 1629   CBG: Recent Labs  Lab 04/13/23 1606 04/13/23 1955 04/14/23 0004 04/14/23 0759 04/14/23 1129  GLUCAP 359* 140* 348* 356* 317*    Studies/Results: MR FOOT RIGHT WO CONTRAST  Result Date: 04/13/2023 CLINICAL DATA:  Plantar midfoot ulcer. EXAM: MRI OF THE RIGHT FOREFOOT WITHOUT CONTRAST TECHNIQUE: Multiplanar, multisequence MR imaging of the right forefoot was performed. No intravenous contrast was administered. COMPARISON:  Right foot x-rays dated April 10, 2023. MRI right foot dated Feb 02, 2018. FINDINGS: Bones/Joint/Cartilage No marrow signal abnormality. Prior fifth ray amputation. No fracture or dislocation. Joint spaces are preserved. No joint effusion. Ligaments Collateral ligaments are intact.  Lisfranc ligament is intact. Muscles and Tendons Flexor and extensor tendons are intact. No tenosynovitis. Complete fatty atrophy of the intrinsic foot muscles. Soft tissue No fluid collection or hematoma.  No soft tissue mass. IMPRESSION: 1. No evidence of osteomyelitis or abscess. Electronically Signed   By: Obie Dredge M.D.   On: 04/13/2023 09:24   IR Removal Tun Cv Cath W/O FL  Result Date: 04/12/2023 INDICATION: 42 year old male. History of end-stage renal disease on hemodialysis via a right IJ tunneled catheter. Team is requesting tunneled HD removal for line holiday EXAM: REMOVAL TUNNELED CENTRAL VENOUS  CATHETER MEDICATIONS: No antibiotic was indicated for this procedure. Moderate (conscious) sedation was not employed during this procedure. FLUOROSCOPY TIME:  None COMPLICATIONS: None immediate. PROCEDURE: Informed written consent was obtained from the patient after a thorough discussion of the procedural risks, benefits and alternatives. All questions were addressed. Maximal Sterile Barrier Technique was utilized including caps, mask, sterile gowns, sterile gloves, sterile drape, hand hygiene and skin antiseptic. A timeout was performed prior to the initiation of the procedure. The patient's right chest and catheter was prepped and draped in a normal sterile fashion. Heparin was removed from both ports of catheter. 1% lidocaine was used for local anesthesia. Using gentle blunt dissection the cuff of the catheter was exposed and the catheter was removed in it's entirety. Pressure was held till hemostasis was obtained. A sterile dressing was applied. The patient tolerated the procedure well with no immediate complications. IMPRESSION: Successful catheter removal as described above. Read by: Anders Grant, NP Electronically Signed   By: Malachy Moan M.D.   On: 04/12/2023 14:17    Medications:   ceFAZolin (ANCEF) IV     vancomycin      amoxicillin-clavulanate  1 tablet Oral BID   Chlorhexidine Gluconate Cloth  6 each Topical Daily   cinacalcet  120 mg Oral QPM   feeding supplement (GLUCERNA SHAKE)  237 mL Oral BID BM   insulin aspart  0-6 Units Subcutaneous Q4H   insulin glargine-yfgn  8 Units Subcutaneous QHS   nutrition supplement (JUVEN)  1 packet Oral BID BM   sevelamer carbonate  4,000 mg Oral TID WC   sodium chloride flush  3 mL Intravenous Q12H    Dialysis Orders:  4h  400/1.5   80kg   2K/3Ca bath  RIJ TDC   Heparin none - in July missed HD on 7/3 and 7/10 - in June missed on 6/5, 6/12, 6/19, 6/24 and 6/28 - last OP HD was 7/12, post wt 83kg (+3kg post) - rocaltrol 1.5 mcg po  mwf - no esa, last Hb 11 on 7/12  Assessment/ Plan:   MRSA bacteremia - w/ ^WBC and low-grade temps, improving. ID following. TDC removed 7/15, replaced this AM after line holiday.  Appreciate IR assistance.  On vanc x 4 weeks. TTE pending. ID following R diabetic foot ulcer - MRI excludes OM. On Augmentin. Podiatry following. per pmd ESRD - on HD MWF. Missed 5 sessions in June and 2 in July so far. HD today per regular schedule.  HTN/ volume - BP mostly in goal.  Does not appear volume overloaded.  UF as tolerated.  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 No esa needs. Follow. Avoid Fe IV with infection. MBD ckd - CCa and phos  in range. Cont po vdra and renvela as binder Nutrition - Renal diet w/fluid restrictions.    Virgina Norfolk, PA-C [facility] 04/14/2023,11:54 AM  LOS: 5 days

## 2023-04-14 NOTE — Progress Notes (Addendum)
PROGRESS NOTE    ROTH RESS  ZOX:[id number] DOB: 04-09-1981 DOA: 04/09/2023 PCP: [name], NP    Brief Narrative:  [name] is a 42 y.o. male with medical history significant for hypertension, insulin-dependent diabetes mellitus, ESRD on hemodialysis, and a left AKA who presented to the emergency department with fever, chills, and elevated heart rate. Patient has a foot wound at the plantar aspect of his right forefoot . In the ED, patient had temperature of 99.9 F, mildly tachycardic but normal BP. Chest x-ray was negative for acute findings and respiratory virus panel was negative.  Lactic acid was normal.  WBC was elevated to 17,400, total bilirubin is 2.1, alkaline phosphatase 197, AST 78, and ALT 86. Podiatry (Dr. [name] Bores) was consulted, blood cultures were collected, and the patient was given 500 mL of LR, vancomycin, and cefepime.  Patient was then admitted hospital for further evaluation and treatment.  During hospitalization, patient has been seen by podiatry, infectious disease and nephrology during hospitalization..  Infectious disease recommended removal of hemodialysis catheter for line holiday and underwent removal on 04/12/23 after Hemodialysis.  Assessment/Plan:   Right Diabetic foot ulcer.   Podiatry and infectious disease on board.  Patient underwent MRI of the foot which does not show any osteomyelitis or abscess. Patient was initially on broad-spectrum antibiotics with vancomycin cefepime and metronidazole. Infectious disease has discontinued cefepime and metronidazole and changed him over to Augmentin.   ABI shows noncompressible right lower extremity arteries.    MRSA bacteremia Management per ID.  Echocardiogram was ordered but patient refused yesterday.  After infectious disease spoke to him he agreed.  To be done hopefully today. Hemodialysis catheter was removed on 7/15.  Plan is to reinsert catheter today. Repeat cultures from 7/14 without any  growth so far. As of now ID is considering 4 weeks of treatment with vancomycin.   Patient has been yelling at nursing staff. He refused to go for HD earlier today and agreed only much later in the day. Every time they attempted to do ECHO today he asked the ECHO tech to come back later . They will now attempt to do ECHO only tomorrow.   ESRD Monday Wednesday Friday. Nephrology is following.  Dialysis catheter was removed on 7/15.  Dialysis catheter to be reinserted today.     Insulin-dependent DM with hyperglycemia. Hemoglobin A1c was 9.5% in February 2024.  Continue sliding scale insulin, Accu-Cheks, diabetic diet. CBGs are poorly controlled. Patient has not been compliant with glucose checks.  Elevated LFTs  Acute hepatitis panel negative.  LFTs were noted to be normal on 7/15.  Diarrhea GI pathogen panel and c diff negative.  Continue supportive care.   Thrombocytopenia  Resolved.    Chronic hyponatremia Stable  DVT prophylaxis: SCDs Start: 04/09/23 2237 Code Status: Full code Family communication: Discussed with patient.  No family at bedside. Disposition: Home likely in 2 to 3 days.   Consultants:  Infectious disease Podiatry Nephrology  Procedures:  Removal of right internal jugular tunneled catheter on 04/12/2023 Hemodialysis.  Antimicrobials: Anti-infectives (From admission, onward)    Start     Dose/Rate Route Frequency Ordered Stop   04/14/23 1200  vancomycin (VANCOCIN) IVPB 1000 mg/200 mL premix        1,000 mg 200 mL/hr over 60 Minutes Intravenous Every M-W-F (Hemodialysis) 04/13/23 1017     04/14/23 0000  ceFAZolin (ANCEF) IVPB 2g/100 mL premix        2 g 200 mL/hr over 30 Minutes  Intravenous  Once 04/13/23 1318     04/13/23 2200  metroNIDAZOLE (FLAGYL) IVPB 500 mg  Status:  Discontinued        500 mg 100 mL/hr over 60 Minutes Intravenous Every 12 hours 04/13/23 1017 04/13/23 1032   04/13/23 2100  amoxicillin-clavulanate (AUGMENTIN) 500-125 MG per  tablet 1 tablet        1 tablet Oral 2 times daily 04/13/23 1359 04/19/23 2159   04/13/23 2000  ceFEPIme (MAXIPIME) 1 g in sodium chloride 0.9 % 100 mL IVPB  Status:  Discontinued        1 g 200 mL/hr over 30 Minutes Intravenous Every 24 hours 04/13/23 1017 04/13/23 1032   04/12/23 1200  vancomycin (VANCOCIN) IVPB 1000 mg/200 mL premix  Status:  Discontinued        1,000 mg 200 mL/hr over 60 Minutes Intravenous Every M-W-F (Hemodialysis) 04/10/23 1310 04/13/23 1017   04/10/23 2000  ceFEPIme (MAXIPIME) 1 g in sodium chloride 0.9 % 100 mL IVPB  Status:  Discontinued        1 g 200 mL/hr over 30 Minutes Intravenous Every 24 hours 04/09/23 2244 04/13/23 1017   04/09/23 2300  vancomycin (VANCOREADY) IVPB 750 mg/150 mL        750 mg 150 mL/hr over 60 Minutes Intravenous  Once 04/09/23 2246 04/10/23 0306   04/09/23 2245  metroNIDAZOLE (FLAGYL) IVPB 500 mg  Status:  Discontinued        500 mg 100 mL/hr over 60 Minutes Intravenous Every 12 hours 04/09/23 2237 04/13/23 1017   04/09/23 2244  vancomycin variable dose per unstable renal function (pharmacist dosing)  Status:  Discontinued         Does not apply See admin instructions 04/09/23 2244 04/10/23 1311   04/09/23 1930  vancomycin (VANCOCIN) IVPB 1000 mg/200 mL premix        1,000 mg 200 mL/hr over 60 Minutes Intravenous  Once 04/09/23 1922 04/09/23 2222   04/09/23 1930  ceFEPIme (MAXIPIME) 1 g in sodium chloride 0.9 % 100 mL IVPB        1 g 200 mL/hr over 30 Minutes Intravenous  Once 04/09/23 1922 04/09/23 2324       Subjective: Seems to be a little bit agitated.  Frustrated by having to wait for dialysis catheter placement.  No complaints offered.  Objective: Vitals:   04/13/23 1615 04/13/23 2000 04/14/23 0023 04/14/23 0738  BP: (!) 146/93 (!) 136/52 119/76 (!) 147/84  Pulse: 74 93 100 100  Resp: 19 20 18 20   Temp: (!) 97.1 F (36.2 C) (!) 97.5 F (36.4 C) 97.7 F (36.5 C) 98 F (36.7 C)  TempSrc: Oral Oral Oral Oral  SpO2:  96% 99% 95% 97%  Weight:      Height:        Intake/Output Summary (Last 24 hours) at 04/14/2023 1003 Last data filed at 04/14/2023 0000 Gross per 24 hour  Intake 500 ml  Output --  Net 500 ml   Filed Weights   04/09/23 1706 04/12/23 0825 04/13/23 0612  Weight: 83.9 kg 83.9 kg 78.6 kg    Physical Examination:  General appearance: Awake alert.  In no distress Resp: Clear to auscultation bilaterally.  Normal effort Cardio: S1-S2 is normal regular.  No S3-S4.  No rubs murmurs or bruit GI: Abdomen is soft.  Nontender nondistended.  Bowel sounds are present normal.  No masses organomegaly   Data Reviewed:   CBC: Recent Labs  Lab 04/09/23 1815 04/10/23 0246  04/11/23 0537 04/12/23 0145 04/13/23 0720  WBC 17.4* 18.2* 13.9* 10.4 9.2  NEUTROABS 15.6*  --   --   --   --   HGB 11.0* 10.7* 11.7* 10.5* 12.6*  HCT 33.8* 34.0* 36.2* 32.9* 39.3  MCV 89.2 93.4 90.0 91.1 91.6  PLT 111* 106* 146* 144* 156    Basic Metabolic Panel: Recent Labs  Lab 04/09/23 1815 04/10/23 0246 04/11/23 0537 04/12/23 0145 04/13/23 0720  NA 134* 132* 126* 125* 131*  K 3.8 3.7 4.0 4.6 3.6  CL 97* 97* 92* 90* 95*  CO2 20* 16* 16* 14* 20*  GLUCOSE 192* 132* 278* 347* 246*  BUN 30* 37* 56* 66* 36*  CREATININE 6.96* 7.78* 10.23* 10.91* 8.14*  CALCIUM 8.9 8.5* 7.6* 7.3* 8.4*  MG  --  2.3 2.5* 2.5* 2.3  PHOS  --  5.3*  --   --   --     Liver Function Tests: Recent Labs  Lab 04/09/23 1815 04/10/23 0246 04/11/23 0537 04/12/23 0145  AST 78* 56* 23 25  ALT 86* 67* 51* 38  ALKPHOS 197* 168* 176* 151*  BILITOT 2.1* 1.6* 1.2 1.5*  PROT 7.4 6.5 7.2 6.6  ALBUMIN 3.2* 2.9* 3.1* 2.9*     Radiology Studies: MR FOOT RIGHT WO CONTRAST  Result Date: 04/13/2023 CLINICAL DATA:  Plantar midfoot ulcer. EXAM: MRI OF THE RIGHT FOREFOOT WITHOUT CONTRAST TECHNIQUE: Multiplanar, multisequence MR imaging of the right forefoot was performed. No intravenous contrast was administered. COMPARISON:  Right foot  x-rays dated April 10, 2023. MRI right foot dated Feb 02, 2018. FINDINGS: Bones/Joint/Cartilage No marrow signal abnormality. Prior fifth ray amputation. No fracture or dislocation. Joint spaces are preserved. No joint effusion. Ligaments Collateral ligaments are intact.  Lisfranc ligament is intact. Muscles and Tendons Flexor and extensor tendons are intact. No tenosynovitis. Complete fatty atrophy of the intrinsic foot muscles. Soft tissue No fluid collection or hematoma.  No soft tissue mass. IMPRESSION: 1. No evidence of osteomyelitis or abscess. Electronically Signed   By: Obie Dredge M.D.   On: 04/13/2023 09:24   IR Removal Tun Cv Cath W/O FL  Result Date: 04/12/2023 INDICATION: 42 year old male. History of end-stage renal disease on hemodialysis via a right IJ tunneled catheter. Team is requesting tunneled HD removal for line holiday EXAM: REMOVAL TUNNELED CENTRAL VENOUS CATHETER MEDICATIONS: No antibiotic was indicated for this procedure. Moderate (conscious) sedation was not employed during this procedure. FLUOROSCOPY TIME:  None COMPLICATIONS: None immediate. PROCEDURE: Informed written consent was obtained from the patient after a thorough discussion of the procedural risks, benefits and alternatives. All questions were addressed. Maximal Sterile Barrier Technique was utilized including caps, mask, sterile gowns, sterile gloves, sterile drape, hand hygiene and skin antiseptic. A timeout was performed prior to the initiation of the procedure. The patient's right chest and catheter was prepped and draped in a normal sterile fashion. Heparin was removed from both ports of catheter. 1% lidocaine was used for local anesthesia. Using gentle blunt dissection the cuff of the catheter was exposed and the catheter was removed in it's entirety. Pressure was held till hemostasis was obtained. A sterile dressing was applied. The patient tolerated the procedure well with no immediate complications. IMPRESSION:  Successful catheter removal as described above. Read by: Anders Grant, NP Electronically Signed   By: Malachy Moan M.D.   On: 04/12/2023 14:17      LOS: 5 days    Osvaldo Shipper, MD [facility] Available via Epic secure chat 7am-7pm After  these hours, please refer to coverage provider listed on [email or web address] 04/14/2023, 10:03 AM

## 2023-04-14 NOTE — Progress Notes (Signed)
Attempted echo at 1:09pm -- pt requested that I come back after he uses bedside commode. Will try again in about an hour.

## 2023-04-14 NOTE — Progress Notes (Signed)
Pt refuses checking vital signs and CBG at 04.00-05.00 am. Pt is alert and fully oriented, agitated and verbally aggressive toward NT and RN staff. Emotional support is provided to the Pt by RN inchart, Sptephanie S.   Pt has been stable hemodynamically, no acute distress noted.  We will continue to monitor.  Filiberto Pinks, RN

## 2023-04-14 NOTE — Progress Notes (Signed)
X2  attempts for the Pt. To come to HD, and  pt states, eat his lunch first. 2 attempt, transport went to get the pt and transport called HD charge and, states, pt is not coming to dialysis. Notified PA Penninger.

## 2023-04-14 NOTE — Progress Notes (Signed)
[facility]    Date of Admission:  04/09/2023     ID: [name] is a 42 y.o. male with  MRSA bacteremia thought to be complicated bacteremia related to HD catheter Principal Problem:   Diabetic foot infection ([name]) Active Problems:   Insulin dependent type 1 diabetes mellitus ([name])   ESRD on dialysis ([name])   Elevated LFTs   Thrombocytopenia ([name])    Subjective: Afebrile. Tired from getting new HD line placed this morning and plan for getting HD this afternoon. Awaiting to get TTE.  Medications:   amoxicillin-clavulanate  1 tablet Oral BID   Chlorhexidine Gluconate Cloth  6 each Topical Daily   cinacalcet  120 mg Oral QPM   feeding supplement (GLUCERNA SHAKE)  237 mL Oral BID BM   insulin aspart  0-15 Units Subcutaneous TID WC   insulin aspart  0-5 Units Subcutaneous QHS   insulin aspart  10 Units Subcutaneous Once   insulin glargine-yfgn  10 Units Subcutaneous QHS   nutrition supplement (JUVEN)  1 packet Oral BID BM   sevelamer carbonate  4,000 mg Oral TID WC   sodium chloride flush  3 mL Intravenous Q12H    Objective: Vital signs in last 24 hours: Temp:  [97.1 F (36.2 C)-98 F (36.7 C)] 98 F (36.7 C) (07/17 1432) Pulse Rate:  [74-101] 101 (07/17 1500) Resp:  [0-25] 14 (07/17 1500) BP: (111-147)/(52-93) 111/85 (07/17 1500) SpO2:  [90 %-100 %] 90 % (07/17 1500) Weight:  [78.4 kg] 78.4 kg (07/17 1432)  Physical Exam  Constitutional: He is oriented to person, place, and time. He appears well-developed and well-nourished. No distress.  HENT:  Mouth/Throat: Oropharynx is clear and moist. No oropharyngeal exudate.  Cardiovascular: Normal rate, regular rhythm and normal heart sounds. Exam reveals no gallop and no friction rub.  No murmur heard.  Pulmonary/Chest: Effort normal and breath sounds normal. No respiratory distress. He has no wheezes.  Abdominal: Soft. Bowel sounds are normal. He exhibits no distension. There is no tenderness.   Lymphadenopathy:  He has no cervical adenopathy.  GMW:NUUVO leg  no c/c/e. +dried plantar ulcer Neurological: He is alert and oriented to person, place, and time.  Skin: Skin is warm and dry. No rash noted. No erythema.  Psychiatric: He has a normal mood and affect. His behavior is normal.    Lab Results Recent Labs    04/12/23 0145 04/13/23 0720  WBC 10.4 9.2  HGB 10.5* 12.6*  HCT 32.9* 39.3  NA 125* 131*  K 4.6 3.6  CL 90* 95*  CO2 14* 20*  BUN 66* 36*  CREATININE 10.91* 8.14*   Liver Panel Recent Labs    04/12/23 0145  PROT 6.6  ALBUMIN 2.9*  AST 25  ALT 38  ALKPHOS 151*  BILITOT 1.5*    Microbiology: 7/14 blood cx ngtd 7/12 blood cx MRSA Studies/Results: IR Fluoro Guide CV Line Left  Result Date: 04/14/2023 INDICATION: End-stage renal disease. Recently admitted with bacteremia, post chronic right internal jugular approach dialysis catheter removal. Bacteremic symptoms have resolved and as such patient presents today for placement of a new tunneled/permanent dialysis catheter for continuation of hemodialysis. EXAM: TUNNELED CENTRAL VENOUS HEMODIALYSIS CATHETER PLACEMENT WITH ULTRASOUND AND FLUOROSCOPIC GUIDANCE MEDICATIONS: Ancef 2 gm IV . The antibiotic was given in an appropriate time interval prior to skin puncture. ANESTHESIA/SEDATION: Moderate (conscious) sedation was employed during this procedure. A total of Versed 1.5 mg and Fentanyl 50 mcg was administered intravenously. Moderate  Sedation Time: 16 minutes. The patient's level of consciousness and vital signs were monitored continuously by radiology nursing throughout the procedure under my direct supervision. FLUOROSCOPY TIME:  1 minute, 30 seconds (18 mGy) COMPLICATIONS: None immediate. PROCEDURE: Informed written consent was obtained from the patient after a discussion of the risks, benefits, and alternatives to treatment. Questions regarding the procedure were encouraged and answered. The right neck and chest  were prepped with chlorhexidine in a sterile fashion, and a sterile drape was applied covering the operative field. Maximum barrier sterile technique with sterile gowns and gloves were used for the procedure. A timeout was performed prior to the initiation of the procedure. 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 Evaluation of the left neck demonstrates wide patency of left internal jugular vein. As such, the decision was made to proceed with left internal jugular approach dialysis catheter placement. After creating a small venotomy incision, a micropuncture kit was utilized to access the internal jugular vein. Real-time ultrasound guidance was utilized for vascular access including the acquisition of a permanent ultrasound image documenting patency of the accessed vessel. The microwire was utilized to measure appropriate catheter length. A stiff Glidewire was advanced to the level of the main pulmonary artery and the micropuncture sheath was exchanged for a peel-away sheath. A palindrome tunneled hemodialysis catheter measuring 28 cm from tip to cuff was tunneled in a retrograde fashion from the anterior chest wall to the venotomy incision. The catheter was then placed through the peel-away sheath with tips ultimately positioned within the superior aspect of the right atrium. Final catheter positioning was confirmed and documented with a spot radiographic image. The catheter aspirates and flushes normally. The catheter was flushed with appropriate volume heparin dwells. The catheter exit site was secured with a 0-Prolene retention suture. The venotomy incision was closed with an interrupted 4-0 Vicryl, Dermabond and Steri-strips. Dressings were applied. The patient tolerated the procedure well without immediate post procedural complication. IMPRESSION: 1. Successful placement of 28 cm tip to cuff tunneled hemodialysis  catheter via the left internal jugular vein with tips terminating within the superior aspect of the right atrium. The catheter is ready for immediate use. 2. 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 Electronically Signed   By: Simonne Come M.D.   On: 04/14/2023 12:07   IR US Guide Vasc Access Left  Result Date: 04/14/2023 INDICATION: End-stage renal disease. Recently admitted with bacteremia, post chronic right internal jugular approach dialysis catheter removal. Bacteremic symptoms have resolved and as such patient presents today for placement of a new tunneled/permanent dialysis catheter for continuation of hemodialysis. EXAM: TUNNELED CENTRAL VENOUS HEMODIALYSIS CATHETER PLACEMENT WITH ULTRASOUND AND FLUOROSCOPIC GUIDANCE MEDICATIONS: Ancef 2 gm IV . The antibiotic was given in an appropriate time interval prior to skin puncture. ANESTHESIA/SEDATION: Moderate (conscious) sedation was employed during this procedure. A total of Versed 1.5 mg and Fentanyl 50 mcg was administered intravenously. Moderate Sedation Time: 16 minutes. The patient's level of consciousness and vital signs were monitored continuously by radiology nursing throughout the procedure under my direct supervision. FLUOROSCOPY TIME:  1 minute, 30 seconds (18 mGy) COMPLICATIONS: None immediate. PROCEDURE: Informed written consent was obtained from the patient after a discussion of the risks, benefits, and alternatives to treatment. Questions regarding the procedure were encouraged and answered. The right neck and chest were prepped with chlorhexidine in a sterile fashion, and a sterile drape  was applied covering the operative field. Maximum barrier sterile technique with sterile gowns and gloves were used for the procedure. A timeout was performed prior to the initiation of the procedure. Sonographic evaluation performed of the right neck demonstrating chronic occlusive thrombus  within the right internal jugular vein. Evaluation of the left neck demonstrates wide patency of left internal jugular vein. As such, the decision was made to proceed with left internal jugular approach dialysis catheter placement. After creating a small venotomy incision, a micropuncture kit was utilized to access the internal jugular vein. Real-time ultrasound guidance was utilized for vascular access including the acquisition of a permanent ultrasound image documenting patency of the accessed vessel. The microwire was utilized to measure appropriate catheter length. A stiff Glidewire was advanced to the level of the main pulmonary artery and the micropuncture sheath was exchanged for a peel-away sheath. A palindrome tunneled hemodialysis catheter measuring 28 cm from tip to cuff was tunneled in a retrograde fashion from the anterior chest wall to the venotomy incision. The catheter was then placed through the peel-away sheath with tips ultimately positioned within the superior aspect of the right atrium. Final catheter positioning was confirmed and documented with a spot radiographic image. The catheter aspirates and flushes normally. The catheter was flushed with appropriate volume heparin dwells. The catheter exit site was secured with a 0-Prolene retention suture. The venotomy incision was closed with an interrupted 4-0 Vicryl, Dermabond and Steri-strips. Dressings were applied. The patient tolerated the procedure well without immediate post procedural complication. IMPRESSION: 1. Successful placement of 28 cm tip to cuff tunneled hemodialysis catheter via the left internal jugular vein with tips terminating within the superior aspect of the right atrium. The catheter is ready for immediate use. 2. 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 Electronically Signed   By: Simonne Come M.D.   On: 04/14/2023 12:07   MR FOOT RIGHT WO CONTRAST  Result  Date: 04/13/2023 CLINICAL DATA:  Plantar midfoot ulcer. EXAM: MRI OF THE RIGHT FOREFOOT WITHOUT CONTRAST TECHNIQUE: Multiplanar, multisequence MR imaging of the right forefoot was performed. No intravenous contrast was administered. COMPARISON:  Right foot x-rays dated April 10, 2023. MRI right foot dated Feb 02, 2018. FINDINGS: Bones/Joint/Cartilage No marrow signal abnormality. Prior fifth ray amputation. No fracture or dislocation. Joint spaces are preserved. No joint effusion. Ligaments Collateral ligaments are intact.  Lisfranc ligament is intact. Muscles and Tendons Flexor and extensor tendons are intact. No tenosynovitis. Complete fatty atrophy of the intrinsic foot muscles. Soft tissue No fluid collection or hematoma.  No soft tissue mass. IMPRESSION: 1. No evidence of osteomyelitis or abscess. Electronically Signed   By: Obie Dredge M.D.   On: 04/13/2023 09:24     Assessment/Plan: Complicated MRSA bacteremia = continue with vancomycin with HD sessions. Will plan to treat for 4 wk since removal of HD line.   Still awaiting to get TTE to see if there are signs of endocarditis. Appears to have cleared bacteremia quickly  Right foot DFU = to finish out short course of amox/clav and then follow up with podiatry.  Will sign off   [facility] Pager: [phone]  04/14/2023, 3:23 PM

## 2023-04-14 NOTE — Inpatient Diabetes Management (Signed)
Inpatient Diabetes Program Recommendations  AACE/ADA: New Consensus Statement on Inpatient Glycemic Control (2015)  Target Ranges:  Prepandial:   less than 140 mg/dL      Peak postprandial:   less than 180 mg/dL (1-2 hours)      Critically ill patients:  140 - 180 mg/dL   Lab Results  Component Value Date   GLUCAP 317 (H) 04/14/2023   HGBA1C 8.7 (H) 04/12/2023    Review of Glycemic Control  Latest Reference Range & Units 04/13/23 11:31 04/13/23 16:06 04/13/23 19:55 04/14/23 00:04 04/14/23 07:59 04/14/23 11:29  Glucose-Capillary 70 - 99 mg/dL 295 (H) 621 (H) 308 (H) 348 (H) 356 (H) 317 (H)  Diabetes history: DM 1 Outpatient Diabetes medications: 70/30 10 units bid, Novolin R 12 units tid meal coverage  Current orders for Inpatient glycemic control:  Semlgee 8 units  Novolog 0-6 units Q4 hours   Inpatient Diabetes Program Recommendations:   Consider slight increase of Semglee to 10 units daily. Consider changing Novolog correction to tid with meals and HS (instead of q 4 hours) and add Novolog 3 units tid with meals (hold if patient eats less than 50% or NPO).   Thanks,  Beryl Meager, RN, BC-ADM Inpatient Diabetes Coordinator Pager [phone]  (8a-5p)

## 2023-04-14 NOTE — Discharge Summary (Signed)
[facility]  Physician Discharge Summary   Patient ID: [name] MRN: [id number] DOB/AGE: 1981/06/29 43 y.o.  Admit date: 04/09/2023 Discharge date: 04/14/2023    PCP: [name], NP  PATIENT LEFT AGAINST MEDICAL ADVICE  INITIAL HISTORY: [name] is a 42 y.o. male with medical history significant for hypertension, insulin-dependent diabetes mellitus, ESRD on hemodialysis, and a left AKA who presented to the emergency department with fever, chills, and elevated heart rate. Patient has a foot wound at the plantar aspect of his right forefoot . In the ED, patient had temperature of 99.9 F, mildly tachycardic but normal BP. Chest x-ray was negative for acute findings and respiratory virus panel was negative.  Lactic acid was normal.  WBC was elevated to 17,400, total bilirubin is 2.1, alkaline phosphatase 197, AST 78, and ALT 86. Podiatry (Dr. [name] Bores) was consulted, blood cultures were collected, and the patient was given 500 mL of LR, vancomycin, and cefepime.  Patient was then admitted hospital for further evaluation and treatment.    HOSPITAL COURSE:   Right Diabetic foot ulcer.   Podiatry and infectious disease on board.  Patient underwent MRI of the foot which does not show any osteomyelitis or abscess. Patient was initially on broad-spectrum antibiotics with vancomycin cefepime and metronidazole. Infectious disease has discontinued cefepime and metronidazole and changed him over to Augmentin.   ABI shows noncompressible right lower extremity arteries.     MRSA bacteremia Management per ID.  Echocardiogram was ordered but patient refused yesterday.  After infectious disease spoke to him he agreed.   Hemodialysis catheter was removed on 7/15.  Repeat cultures were negative.  Dialysis catheter was reinserted by interventional radiology this morning.  Patient to undergo dialysis later today.   ID with considering 4 weeks of treatment with vancomycin.   Echocardiogram still pending.  Patient subsequently decided to leave AGAINST MEDICAL ADVICE.   ESRD Monday Wednesday Friday.  Insulin-dependent DM with hyperglycemia.   Elevated LFTs  Acute hepatitis panel negative.  LFTs were noted to be normal on 7/15.   Diarrhea GI pathogen panel and c diff negative.    Thrombocytopenia  Resolved.     Chronic hyponatremia Stable   PATIENT LEFT AGAINST MEDICAL ADVICE   PERTINENT LABS:  The results of significant diagnostics from this hospitalization (including imaging, microbiology, ancillary and laboratory) are listed below for reference.    Microbiology: Recent Results (from the past 240 hour(s))  Resp panel by RT-PCR (RSV, Flu A&B, Covid) Anterior Nasal Swab     Status: None   Collection Time: 04/09/23  5:36 PM   Specimen: Anterior Nasal Swab  Result Value Ref Range Status   SARS Coronavirus 2 by RT PCR NEGATIVE NEGATIVE Final   Influenza A by PCR NEGATIVE NEGATIVE Final   Influenza B by PCR NEGATIVE NEGATIVE Final    Comment: (NOTE) The Xpert Xpress SARS-CoV-2/FLU/RSV plus assay is intended as an aid in the diagnosis of influenza from Nasopharyngeal swab specimens and should not be used as a sole basis for treatment. Nasal washings and aspirates are unacceptable for Xpert Xpress SARS-CoV-2/FLU/RSV testing.  Fact Sheet for Patients: BloggerCourse.com  Fact Sheet for Healthcare Providers: SeriousBroker.it  This test is not yet approved or cleared by the [place] FDA and has been authorized for detection and/or diagnosis of SARS-CoV-2 by FDA under an Emergency Use Authorization (EUA). This EUA will remain in effect (meaning this test can be used) for the duration of the COVID-19 declaration under Section 564(b)(1) of  the Act, 21 U.S.C. section 360bbb-3(b)(1), unless the authorization is terminated or revoked.     Resp Syncytial Virus by PCR NEGATIVE NEGATIVE Final     Comment: (NOTE) Fact Sheet for Patients: BloggerCourse.com  Fact Sheet for Healthcare Providers: SeriousBroker.it  This test is not yet approved or cleared by the [place] FDA and has been authorized for detection and/or diagnosis of SARS-CoV-2 by FDA under an Emergency Use Authorization (EUA). This EUA will remain in effect (meaning this test can be used) for the duration of the COVID-19 declaration under Section 564(b)(1) of the Act, 21 U.S.C. section 360bbb-3(b)(1), unless the authorization is terminated or revoked.  Performed at [facility], [address] [place], [name] [address]   Blood culture (routine x 2)     Status: Abnormal   Collection Time: 04/09/23  6:15 PM   Specimen: BLOOD LEFT HAND  Result Value Ref Range Status   Specimen Description BLOOD LEFT HAND  Final   Special Requests   Final    BOTTLES DRAWN AEROBIC ONLY Blood Culture adequate volume   Culture  Setup Time   Final    GRAM POSITIVE COCCI AEROBIC BOTTLE ONLY CRITICAL RESULT CALLED TO, READ BACK BY AND VERIFIED WITH: [facility] JESSICA MILLEN [id number] AT 0841 BY EC Performed at [facility] [place] [facility], [address], [place], [name] [address]    Culture METHICILLIN RESISTANT STAPHYLOCOCCUS AUREUS (A)  Final   Report Status 04/12/2023 FINAL  Final   Organism ID, Bacteria METHICILLIN RESISTANT STAPHYLOCOCCUS AUREUS  Final      Susceptibility   Methicillin resistant staphylococcus aureus - MIC*    CIPROFLOXACIN >=8 RESISTANT Resistant     ERYTHROMYCIN >=8 RESISTANT Resistant     GENTAMICIN <=0.5 SENSITIVE Sensitive     OXACILLIN >=4 RESISTANT Resistant     TETRACYCLINE <=1 SENSITIVE Sensitive     VANCOMYCIN <=0.5 SENSITIVE Sensitive     TRIMETH/SULFA <=10 SENSITIVE Sensitive     CLINDAMYCIN <=0.25 SENSITIVE Sensitive     RIFAMPIN <=0.5 SENSITIVE Sensitive     Inducible Clindamycin NEGATIVE Sensitive     LINEZOLID 2 SENSITIVE Sensitive     *  METHICILLIN RESISTANT STAPHYLOCOCCUS AUREUS  Blood culture (routine x 2)     Status: Abnormal   Collection Time: 04/09/23  6:15 PM   Specimen: BLOOD LEFT ARM  Result Value Ref Range Status   Specimen Description BLOOD LEFT ARM  Final   Special Requests   Final    BOTTLES DRAWN AEROBIC ONLY Blood Culture results may not be optimal due to an excessive volume of blood received in culture bottles   Culture  Setup Time   Final    GRAM POSITIVE COCCI AEROBIC BOTTLE ONLY CRITICAL VALUE NOTED.  VALUE IS CONSISTENT WITH PREVIOUSLY REPORTED AND CALLED VALUE.    Culture (A)  Final    STAPHYLOCOCCUS AUREUS SUSCEPTIBILITIES PERFORMED ON PREVIOUS CULTURE WITHIN THE LAST 5 DAYS. Performed at [facility], [address], [place], [name] [address]    Report Status 04/12/2023 FINAL  Final  Blood Culture ID Panel (Reflexed)     Status: Abnormal   Collection Time: 04/09/23  6:15 PM  Result Value Ref Range Status   Enterococcus faecalis NOT DETECTED NOT DETECTED Final   Enterococcus Faecium NOT DETECTED NOT DETECTED Final   Listeria monocytogenes NOT DETECTED NOT DETECTED Final   Staphylococcus species DETECTED (A) NOT DETECTED Final    Comment: CRITICAL RESULT CALLED TO, READ BACK BY AND VERIFIED WITH: [facility]  JESSICA MILLEN [phone] AT 0841 BY EC    Staphylococcus aureus (BCID) DETECTED (A) NOT DETECTED Final    Comment: Methicillin (oxacillin)-resistant Staphylococcus aureus (MRSA). MRSA is predictably resistant to beta-lactam antibiotics (except ceftaroline). Preferred therapy is vancomycin unless clinically contraindicated. Patient requires contact precautions if  hospitalized. CRITICAL RESULT CALLED TO, READ BACK BY AND VERIFIED WITH: [facility] JESSICA MILLEN AT [id number] AT 0841 BY EC    Staphylococcus epidermidis NOT DETECTED NOT DETECTED Final   Staphylococcus lugdunensis NOT DETECTED NOT DETECTED Final   Streptococcus species NOT DETECTED NOT DETECTED Final   Streptococcus agalactiae  NOT DETECTED NOT DETECTED Final   Streptococcus pneumoniae NOT DETECTED NOT DETECTED Final   Streptococcus pyogenes NOT DETECTED NOT DETECTED Final   A.calcoaceticus-baumannii NOT DETECTED NOT DETECTED Final   Bacteroides fragilis NOT DETECTED NOT DETECTED Final   Enterobacterales NOT DETECTED NOT DETECTED Final   Enterobacter cloacae complex NOT DETECTED NOT DETECTED Final   Escherichia coli NOT DETECTED NOT DETECTED Final   Klebsiella aerogenes NOT DETECTED NOT DETECTED Final   Klebsiella oxytoca NOT DETECTED NOT DETECTED Final   Klebsiella pneumoniae NOT DETECTED NOT DETECTED Final   Proteus species NOT DETECTED NOT DETECTED Final   Salmonella species NOT DETECTED NOT DETECTED Final   Serratia marcescens NOT DETECTED NOT DETECTED Final   Haemophilus influenzae NOT DETECTED NOT DETECTED Final   Neisseria meningitidis NOT DETECTED NOT DETECTED Final   Pseudomonas aeruginosa NOT DETECTED NOT DETECTED Final   Stenotrophomonas maltophilia NOT DETECTED NOT DETECTED Final   Candida albicans NOT DETECTED NOT DETECTED Final   Candida auris NOT DETECTED NOT DETECTED Final   Candida glabrata NOT DETECTED NOT DETECTED Final   Candida krusei NOT DETECTED NOT DETECTED Final   Candida parapsilosis NOT DETECTED NOT DETECTED Final   Candida tropicalis NOT DETECTED NOT DETECTED Final   Cryptococcus neoformans/gattii NOT DETECTED NOT DETECTED Final   Meth resistant mecA/C and MREJ DETECTED (A) NOT DETECTED Final    Comment: CRITICAL RESULT CALLED TO, READ BACK BY AND VERIFIED WITH: [facility] JESSICA MILLEN [phone] AT 0841 BY EC Performed at [facility], [address] [place], [name] [address]   C Difficile Quick Screen w PCR reflex     Status: None   Collection Time: 04/10/23  6:09 AM   Specimen: STOOL  Result Value Ref Range Status   C Diff antigen NEGATIVE NEGATIVE Final   C Diff toxin NEGATIVE NEGATIVE Final   C Diff interpretation No C. difficile detected.  Final    Comment:  Performed at [facility], [address] [place], [name] [address]  Gastrointestinal Panel by PCR , Stool     Status: None   Collection Time: 04/10/23  6:09 AM   Specimen: STOOL  Result Value Ref Range Status   Campylobacter species NOT DETECTED NOT DETECTED Final   Plesimonas shigelloides NOT DETECTED NOT DETECTED Final   Salmonella species NOT DETECTED NOT DETECTED Final   Yersinia enterocolitica NOT DETECTED NOT DETECTED Final   Vibrio species NOT DETECTED NOT DETECTED Final   Vibrio cholerae NOT DETECTED NOT DETECTED Final   Enteroaggregative E coli (EAEC) NOT DETECTED NOT DETECTED Final   Enteropathogenic E coli (EPEC) NOT DETECTED NOT DETECTED Final   Enterotoxigenic E coli (ETEC) NOT DETECTED NOT DETECTED Final   Shiga like toxin producing E coli (STEC) NOT DETECTED NOT DETECTED Final   Shigella/Enteroinvasive E coli (EIEC) NOT DETECTED NOT DETECTED Final   Cryptosporidium NOT DETECTED NOT DETECTED Final  Cyclospora cayetanensis NOT DETECTED NOT DETECTED Final   Entamoeba histolytica NOT DETECTED NOT DETECTED Final   Giardia lamblia NOT DETECTED NOT DETECTED Final   Adenovirus F40/41 NOT DETECTED NOT DETECTED Final   Astrovirus NOT DETECTED NOT DETECTED Final   Norovirus GI/GII NOT DETECTED NOT DETECTED Final   Rotavirus A NOT DETECTED NOT DETECTED Final   Sapovirus (I, II, IV, and V) NOT DETECTED NOT DETECTED Final    Comment: Performed at [facility], [address] [place], [name] [address]  Culture, blood (Routine X 2) w Reflex to ID Panel     Status: None (Preliminary result)   Collection Time: 04/11/23  4:29 PM   Specimen: BLOOD RIGHT ARM  Result Value Ref Range Status   Specimen Description BLOOD RIGHT ARM  Final   Special Requests   Final    BOTTLES DRAWN AEROBIC AND ANAEROBIC Blood Culture adequate volume   Culture   Final    NO GROWTH 3 DAYS Performed at [facility], [address], [place], [name] [address]    Report Status PENDING   Incomplete  Culture, blood (Routine X 2) w Reflex to ID Panel     Status: None (Preliminary result)   Collection Time: 04/11/23  4:29 PM   Specimen: BLOOD LEFT HAND  Result Value Ref Range Status   Specimen Description BLOOD LEFT HAND  Final   Special Requests   Final    BOTTLES DRAWN AEROBIC AND ANAEROBIC Blood Culture adequate volume   Culture   Final    NO GROWTH 3 DAYS Performed at [facility], [address] [place], [name] [address]    Report Status PENDING  Incomplete     Labs:   Basic Metabolic Panel: Recent Labs  Lab 04/09/23 1815 04/10/23 0246 04/11/23 0537 04/12/23 0145 04/13/23 0720  NA 134* 132* 126* 125* 131*  K 3.8 3.7 4.0 4.6 3.6  CL 97* 97* 92* 90* 95*  CO2 20* 16* 16* 14* 20*  GLUCOSE 192* 132* 278* 347* 246*  BUN 30* 37* 56* 66* 36*  CREATININE 6.96* 7.78* 10.23* 10.91* 8.14*  CALCIUM 8.9 8.5* 7.6* 7.3* 8.4*  MG  --  2.3 2.5* 2.5* 2.3  PHOS  --  5.3*  --   --   --    Liver Function Tests: Recent Labs  Lab 04/09/23 1815 04/10/23 0246 04/11/23 0537 04/12/23 0145  AST 78* 56* 23 25  ALT 86* 67* 51* 38  ALKPHOS 197* 168* 176* 151*  BILITOT 2.1* 1.6* 1.2 1.5*  PROT 7.4 6.5 7.2 6.6  ALBUMIN 3.2* 2.9* 3.1* 2.9*    CBC: Recent Labs  Lab 04/09/23 1815 04/10/23 0246 04/11/23 0537 04/12/23 0145 04/13/23 0720  WBC 17.4* 18.2* 13.9* 10.4 9.2  NEUTROABS 15.6*  --   --   --   --   HGB 11.0* 10.7* 11.7* 10.5* 12.6*  HCT 33.8* 34.0* 36.2* 32.9* 39.3  MCV 89.2 93.4 90.0 91.1 91.6  PLT 111* 106* 146* 144* 156     CBG: Recent Labs  Lab 04/14/23 0004 04/14/23 0759 04/14/23 1129 04/14/23 1404 04/14/23 1717  GLUCAP 348* 356* 317* 476* 263*     IMAGING STUDIES IR Fluoro Guide CV Line Left  Result Date: 04/14/2023 INDICATION: End-stage renal disease. Recently admitted with bacteremia, post chronic right internal jugular approach dialysis catheter removal. Bacteremic symptoms have resolved and as such patient presents today for  placement of a new tunneled/permanent dialysis catheter for continuation of hemodialysis. EXAM:  TUNNELED CENTRAL VENOUS HEMODIALYSIS CATHETER PLACEMENT WITH ULTRASOUND AND FLUOROSCOPIC GUIDANCE MEDICATIONS: Ancef 2 gm IV . The antibiotic was given in an appropriate time interval prior to skin puncture. ANESTHESIA/SEDATION: Moderate (conscious) sedation was employed during this procedure. A total of Versed 1.5 mg and Fentanyl 50 mcg was administered intravenously. Moderate Sedation Time: 16 minutes. The patient's level of consciousness and vital signs were monitored continuously by radiology nursing throughout the procedure under my direct supervision. FLUOROSCOPY TIME:  1 minute, 30 seconds (18 mGy) COMPLICATIONS: None immediate. PROCEDURE: Informed written consent was obtained from the patient after a discussion of the risks, benefits, and alternatives to treatment. Questions regarding the procedure were encouraged and answered. The right neck and chest were prepped with chlorhexidine in a sterile fashion, and a sterile drape was applied covering the operative field. Maximum barrier sterile technique with sterile gowns and gloves were used for the procedure. A timeout was performed prior to the initiation of the procedure. 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 Evaluation of the left neck demonstrates wide patency of left internal jugular vein. As such, the decision was made to proceed with left internal jugular approach dialysis catheter placement. After creating a small venotomy incision, a micropuncture kit was utilized to access the internal jugular vein. Real-time ultrasound guidance was utilized for vascular access including the acquisition of a permanent ultrasound image documenting patency of the accessed vessel. The microwire was utilized to measure appropriate catheter length. A stiff Glidewire was advanced to the  level of the main pulmonary artery and the micropuncture sheath was exchanged for a peel-away sheath. A palindrome tunneled hemodialysis catheter measuring 28 cm from tip to cuff was tunneled in a retrograde fashion from the anterior chest wall to the venotomy incision. The catheter was then placed through the peel-away sheath with tips ultimately positioned within the superior aspect of the right atrium. Final catheter positioning was confirmed and documented with a spot radiographic image. The catheter aspirates and flushes normally. The catheter was flushed with appropriate volume heparin dwells. The catheter exit site was secured with a 0-Prolene retention suture. The venotomy incision was closed with an interrupted 4-0 Vicryl, Dermabond and Steri-strips. Dressings were applied. The patient tolerated the procedure well without immediate post procedural complication. IMPRESSION: 1. Successful placement of 28 cm tip to cuff tunneled hemodialysis catheter via the left internal jugular vein with tips terminating within the superior aspect of the right atrium. The catheter is ready for immediate use. 2. 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 Electronically Signed   By: Simonne Come M.D.   On: 04/14/2023 12:07   IR US Guide Vasc Access Left  Result Date: 04/14/2023 INDICATION: End-stage renal disease. Recently admitted with bacteremia, post chronic right internal jugular approach dialysis catheter removal. Bacteremic symptoms have resolved and as such patient presents today for placement of a new tunneled/permanent dialysis catheter for continuation of hemodialysis. EXAM: TUNNELED CENTRAL VENOUS HEMODIALYSIS CATHETER PLACEMENT WITH ULTRASOUND AND FLUOROSCOPIC GUIDANCE MEDICATIONS: Ancef 2 gm IV . The antibiotic was given in an appropriate time interval prior to skin puncture. ANESTHESIA/SEDATION: Moderate (conscious) sedation was employed during  this procedure. A total of Versed 1.5 mg and Fentanyl 50 mcg was administered intravenously. Moderate Sedation Time: 16 minutes. The patient's level of consciousness and vital signs were monitored continuously by radiology nursing throughout the procedure under my direct supervision. FLUOROSCOPY TIME:  1  minute, 30 seconds (18 mGy) COMPLICATIONS: None immediate. PROCEDURE: Informed written consent was obtained from the patient after a discussion of the risks, benefits, and alternatives to treatment. Questions regarding the procedure were encouraged and answered. The right neck and chest were prepped with chlorhexidine in a sterile fashion, and a sterile drape was applied covering the operative field. Maximum barrier sterile technique with sterile gowns and gloves were used for the procedure. A timeout was performed prior to the initiation of the procedure. 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 Evaluation of the left neck demonstrates wide patency of left internal jugular vein. As such, the decision was made to proceed with left internal jugular approach dialysis catheter placement. After creating a small venotomy incision, a micropuncture kit was utilized to access the internal jugular vein. Real-time ultrasound guidance was utilized for vascular access including the acquisition of a permanent ultrasound image documenting patency of the accessed vessel. The microwire was utilized to measure appropriate catheter length. A stiff Glidewire was advanced to the level of the main pulmonary artery and the micropuncture sheath was exchanged for a peel-away sheath. A palindrome tunneled hemodialysis catheter measuring 28 cm from tip to cuff was tunneled in a retrograde fashion from the anterior chest wall to the venotomy incision. The catheter was then placed through the peel-away sheath with tips ultimately positioned within the  superior aspect of the right atrium. Final catheter positioning was confirmed and documented with a spot radiographic image. The catheter aspirates and flushes normally. The catheter was flushed with appropriate volume heparin dwells. The catheter exit site was secured with a 0-Prolene retention suture. The venotomy incision was closed with an interrupted 4-0 Vicryl, Dermabond and Steri-strips. Dressings were applied. The patient tolerated the procedure well without immediate post procedural complication. IMPRESSION: 1. Successful placement of 28 cm tip to cuff tunneled hemodialysis catheter via the left internal jugular vein with tips terminating within the superior aspect of the right atrium. The catheter is ready for immediate use. 2. 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 Electronically Signed   By: Simonne Come M.D.   On: 04/14/2023 12:07   MR FOOT RIGHT WO CONTRAST  Result Date: 04/13/2023 CLINICAL DATA:  Plantar midfoot ulcer. EXAM: MRI OF THE RIGHT FOREFOOT WITHOUT CONTRAST TECHNIQUE: Multiplanar, multisequence MR imaging of the right forefoot was performed. No intravenous contrast was administered. COMPARISON:  Right foot x-rays dated April 10, 2023. MRI right foot dated Feb 02, 2018. FINDINGS: Bones/Joint/Cartilage No marrow signal abnormality. Prior fifth ray amputation. No fracture or dislocation. Joint spaces are preserved. No joint effusion. Ligaments Collateral ligaments are intact.  Lisfranc ligament is intact. Muscles and Tendons Flexor and extensor tendons are intact. No tenosynovitis. Complete fatty atrophy of the intrinsic foot muscles. Soft tissue No fluid collection or hematoma.  No soft tissue mass. IMPRESSION: 1. No evidence of osteomyelitis or abscess. Electronically Signed   By: Obie Dredge M.D.   On: 04/13/2023 09:24   IR Removal Tun Cv Cath W/O FL  Result Date: 04/12/2023 INDICATION: 42 year old male. History of  end-stage renal disease on hemodialysis via a right IJ tunneled catheter. Team is requesting tunneled HD removal for line holiday EXAM: REMOVAL TUNNELED CENTRAL VENOUS CATHETER MEDICATIONS: No antibiotic was indicated for this procedure. Moderate (conscious) sedation was not employed during this procedure. FLUOROSCOPY TIME:  None COMPLICATIONS: None immediate. PROCEDURE: Informed written consent was obtained from  the patient after a thorough discussion of the procedural risks, benefits and alternatives. All questions were addressed. Maximal Sterile Barrier Technique was utilized including caps, mask, sterile gowns, sterile gloves, sterile drape, hand hygiene and skin antiseptic. A timeout was performed prior to the initiation of the procedure. The patient's right chest and catheter was prepped and draped in a normal sterile fashion. Heparin was removed from both ports of catheter. 1% lidocaine was used for local anesthesia. Using gentle blunt dissection the cuff of the catheter was exposed and the catheter was removed in it's entirety. Pressure was held till hemostasis was obtained. A sterile dressing was applied. The patient tolerated the procedure well with no immediate complications. IMPRESSION: Successful catheter removal as described above. Read by: Anders Grant, NP Electronically Signed   By: Malachy Moan M.D.   On: 04/12/2023 14:17   VAS [place] ABI WITH/WO TBI  Result Date: 04/11/2023  LOWER EXTREMITY DOPPLER STUDY Patient Name:  [name]  Date of Exam:   04/10/2023 Medical Rec #: [id number]      Accession #:    [id number] Date of Birth: [birth year]     Patient Gender: M Patient Age:   35 years Exam Location:  [facility] Procedure:      VAS [place] ABI WITH/WO TBI Referring Phys: TIMOTHY OPYD --------------------------------------------------------------------------------  Indications: Ulceration. High Risk Factors: Hypertension, Diabetes. 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  Vascular  Interventions: Left AKA 10/15/20. Comparison Study: Prior ABI done 05/22/21 Performing Technologist: Sherren Kerns RVS  Examination Guidelines: A complete evaluation includes at minimum, Doppler waveform signals and systolic blood pressure reading at the level of bilateral brachial, anterior tibial, and posterior tibial arteries, when vessel segments are accessible. Bilateral testing is considered an integral part of a complete examination. Photoelectric Plethysmograph (PPG) waveforms and toe systolic pressure readings are included as required and additional duplex testing as needed. Limited examinations for reoccurring indications may be performed as noted.  ABI Findings: +---------+------------------+-----+---------+--------+ Right    Rt Pressure (mmHg)IndexWaveform Comment  +---------+------------------+-----+---------+--------+ Brachial 170                    triphasic         +---------+------------------+-----+---------+--------+ PTA      253               1.49 triphasic         +---------+------------------+-----+---------+--------+ DP       162               0.95 triphasic         +---------+------------------+-----+---------+--------+ Great Toe100               0.59                   +---------+------------------+-----+---------+--------+ +--------+------------------+-----+---------+-------+ Left    Lt Pressure (mmHg)IndexWaveform Comment +--------+------------------+-----+---------+-------+ [id number]                    triphasicAKA     +--------+------------------+-----+---------+-------+ +-------+-------------+-----------+----------------+------------+ ABI/TBIToday's ABI  Today's TBIPrevious ABI    Previous TBI +-------+-------------+-----------+----------------+------------+ Right  1.49/non comp0.59       non compressible0.45         +-------+-------------+-----------+----------------+------------+ Left   AKA          AKA        AKA              AKA          +-------+-------------+-----------+----------------+------------+  Arterial wall calcification precludes  accurate ankle pressures and ABIs. Bilateral ABIs and TBIs appear essentially unchanged compared to prior study on 05/22/21.  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 The right toe-brachial index is abnormal. *See table(s) above for measurements and observations.  Electronically signed by Gerarda Fraction on 04/11/2023 at 12:02:18 PM.    Final    DG Foot Complete Right  Result Date: 04/10/2023 CLINICAL DATA:  Right foot infection. EXAM: RIGHT FOOT COMPLETE - 3+ VIEW COMPARISON:  Right foot x-rays dated December 12, 2019. FINDINGS: No bony destruction or periosteal reaction. 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 No acute fracture or dislocation. Joint spaces are preserved. Bone mineralization is normal. IMPRESSION: 1. No acute osseous abnormality. Electronically Signed   By: Obie Dredge M.D.   On: 04/10/2023 09:13   US Abdomen Limited RUQ (LIVER/GB)  Result Date: 04/10/2023 CLINICAL DATA:  Elevated liver function tests. EXAM: ULTRASOUND ABDOMEN LIMITED RIGHT UPPER QUADRANT COMPARISON:  None Available. FINDINGS: Gallbladder: No gallstones or wall thickening visualized (2.4 mm). No sonographic Murphy sign noted by sonographer. Common bile duct: Diameter: 3.6 mm Liver: No focal lesion identified. Diffusely increased echogenicity of the liver parenchyma is noted. Portal vein is patent on color Doppler imaging with normal direction of blood flow towards the liver. Other: None. IMPRESSION: Hepatic steatosis without focal liver lesions. Electronically Signed   By: Aram Candela M.D.   On: 04/10/2023 01:40   DG Chest Portable 1 View  Result Date: 04/09/2023 CLINICAL DATA:  Fever and cough. EXAM: PORTABLE CHEST 1 VIEW COMPARISON:  April 17, 2022 FINDINGS: Large-bore right central venous catheter stable.  Cardiomediastinal silhouette is normal. Mediastinal contours appear intact. There is no evidence of focal airspace consolidation, pleural effusion or pneumothorax. Osseous structures are without acute abnormality. Soft tissues are grossly normal. IMPRESSION: No active disease. Electronically Signed   By: Ted Mcalpine M.D.   On: 04/09/2023 18:41     PATIENT LEFT AGAINST MEDICAL ADVICE    [name]  [facility] Pager on [email or web address]  04/15/2023, 11:36 AM

## 2023-04-15 ENCOUNTER — Telehealth: Payer: Self-pay

## 2023-04-15 ENCOUNTER — Other Ambulatory Visit (INDEPENDENT_AMBULATORY_CARE_PROVIDER_SITE_OTHER): Payer: Self-pay | Admitting: Primary Care

## 2023-04-15 NOTE — Plan of Care (Signed)
[facility] Patient Discharge Orders- [facility] CLINIC: [facility]  *Left AMA*  Patient's name: [name] Admit/DC Dates: 04/09/2023 - 04/14/2023  Discharge Diagnoses: MRSA bacteremia - left prior to getting TTE, s/p line holiday  R diabetic foot infection, MRI excludes Augmentin  Aranesp: Given: no   Last Hgb: 12.6 PRBC's Given: no  ESA dose for discharge: none IV Iron dose at discharge: none  Heparin change: no  EDW Change: yes New EDW: 78kg  Bath Change: no  Access intervention/Change: yes Details: new TDC placed on 04/13/13 by IR  Hectorol/Calcitriol change: no  Discharge Labs: Calcium 8.4 Phosphorus 5.3 Albumin 2.9 K+ 3.6  IV Antibiotics: yes Details:  *Vancomycin 1000mg  qHD with end date of 05/10/23.  Check weekly Vanc level on Wednesday. *Please call and see if he has Augmentin antibiotic at home he was supposed to take for foot infection.  IF NOT call into pharmacy:  Augmentin 500mg  tablet - Take 1 tablet PO every night x 5 days.    On Coumadin?: no Last INR: Next INR: Managed By:   OTHER/APPTS/LAB ORDERS:    D/C Meds to be reconciled by nurse after every discharge.  Completed By:   Reviewed by: MD:______ RN_______

## 2023-04-15 NOTE — Transitions of Care (Post Inpatient/ED Visit) (Signed)
   04/15/2023  Name: [name] MRN: [id number] DOB: 1981-03-26  Today's TOC FU Call Status: Today's TOC FU Call Status:: Successful TOC FU Call Competed TOC FU Call Complete Date: 04/15/23  Transition Care Management Follow-up Telephone Call Date of Discharge: 04/14/23 Discharge Facility: [facility]) Type of Discharge: Inpatient Admission Primary Inpatient Discharge Diagnosis:: Sepsis How have you been since you were released from the hospital?: Worse (Patient having a lot of pain) Any questions or concerns?: Yes Patient Questions/Concerns:: Patient states he needs Percocet and not to call him back until I discuss with Gwinda Passe, NP Patient Questions/Concerns Addressed: Notified Provider of Patient Questions/Concerns  Items Reviewed: Did you receive and understand the discharge instructions provided?:  (Patient refused) Medications obtained,verified, and reconciled?: No Medications Not Reviewed Reasons:: Notified Provider of Medication Management Concern Any new allergies since your discharge?:  (Unknown)  Medications Reviewed Today: Medications Reviewed Today     Reviewed by Jodelle Gross, RN (Case Manager) on 04/15/23 at 1337  Med List Status: <None>   Medication Order Taking? Sig Documenting Provider Last Dose Status Informant  calcitRIOL (ROCALTROL) 0.5 MCG capsule [id number] No Take 3 capsules (1.5 mcg total) by mouth every Monday, Wednesday, and Friday with hemodialysis. Kathlen Mody, MD Unknown Active Self           Med Note Electa Sniff [facility]   Thu Apr 15, 2023  1:34 PM) Patient refused TOC medication review.   cinacalcet (SENSIPAR) 60 MG tablet [id number]  Take 120 mg by mouth every evening. [provider]  Active   gabapentin (NEURONTIN) 100 MG capsule [id number]  TAKE 1 CAPSULE BY MOUTH THREE TIMES DAILY [name], NP  Active   glucose blood (ONETOUCH VERIO) test strip [id number]  USE 1 STRIP TO CHECK GLUCOSE THREE TIMES DAILY AS DIRECTED  [name], NP  Active   insulin NPH-regular Human (NOVOLIN 70/30) (70-30) 100 UNIT/ML injection [id number]  Inject 10 Units into the skin 2 (two) times daily with a meal. [name], NP  Active   insulin regular (NOVOLIN R RELION) 100 units/mL injection [id number]  INJECT 12 UNITS SUBCUTANEOUSLY THREE TIMES DAILY BEFORE MEAL(S) [name], NP  Active   multivitamin (RENA-VIT) TABS tablet [id number]  Take 1 tablet by mouth at bedtime. Kathlen Mody, MD  Active Self  sevelamer carbonate (RENVELA) 800 MG tablet [id number]  Take 2,400-4,000 mg by mouth See admin instructions. Take 4,000 mg (5 tablets) by mouth with meals and 2,400 mg (3 tablets) with snacks [provider]  Active Self  Med List Note Vela Prose 10/14/20 1921): Dialysis days are currently Mon/Wed/Fri @ [facility]- [address], [place], [name] [address] [phone]            Patient refused to complete TOC call.  He said he needs pain pills, does not want appointment.  Jodelle Gross, RN, BSN, CCM Care Management Coordinator [place] Phone: [phone]/Fax: [phone]

## 2023-04-15 NOTE — Progress Notes (Signed)
Late Note Entry   Pt left hospital AMA yesterday. Contacted [facility] this morning to advise clinic that pt left hospital and should resume tomorrow. Renal PA sent orders to clinic for iv abx with HD.   Olivia Canter Renal Navigator [phone]

## 2023-04-16 DIAGNOSIS — D688 Other specified coagulation defects: Secondary | ICD-10-CM | POA: Diagnosis not present

## 2023-04-16 DIAGNOSIS — N2581 Secondary hyperparathyroidism of renal origin: Secondary | ICD-10-CM | POA: Diagnosis not present

## 2023-04-16 DIAGNOSIS — Z992 Dependence on renal dialysis: Secondary | ICD-10-CM | POA: Diagnosis not present

## 2023-04-16 DIAGNOSIS — N186 End stage renal disease: Secondary | ICD-10-CM | POA: Diagnosis not present

## 2023-04-16 DIAGNOSIS — A499 Bacterial infection, unspecified: Secondary | ICD-10-CM | POA: Diagnosis not present

## 2023-04-16 DIAGNOSIS — T8249XA Other complication of vascular dialysis catheter, initial encounter: Secondary | ICD-10-CM | POA: Diagnosis not present

## 2023-04-16 DIAGNOSIS — E8770 Fluid overload, unspecified: Secondary | ICD-10-CM | POA: Diagnosis not present

## 2023-04-16 DIAGNOSIS — Z5181 Encounter for therapeutic drug level monitoring: Secondary | ICD-10-CM | POA: Diagnosis not present

## 2023-04-16 DIAGNOSIS — D689 Coagulation defect, unspecified: Secondary | ICD-10-CM | POA: Diagnosis not present

## 2023-04-16 DIAGNOSIS — D631 Anemia in chronic kidney disease: Secondary | ICD-10-CM | POA: Diagnosis not present

## 2023-04-16 DIAGNOSIS — E1122 Type 2 diabetes mellitus with diabetic chronic kidney disease: Secondary | ICD-10-CM | POA: Diagnosis not present

## 2023-04-16 DIAGNOSIS — Z8631 Personal history of diabetic foot ulcer: Secondary | ICD-10-CM | POA: Diagnosis not present

## 2023-04-16 LAB — CULTURE, BLOOD (ROUTINE X 2)
Culture: NO GROWTH
Culture: NO GROWTH
Special Requests: ADEQUATE
Special Requests: ADEQUATE

## 2023-04-19 DIAGNOSIS — Z8631 Personal history of diabetic foot ulcer: Secondary | ICD-10-CM | POA: Diagnosis not present

## 2023-04-19 DIAGNOSIS — A499 Bacterial infection, unspecified: Secondary | ICD-10-CM | POA: Diagnosis not present

## 2023-04-19 DIAGNOSIS — E1122 Type 2 diabetes mellitus with diabetic chronic kidney disease: Secondary | ICD-10-CM | POA: Diagnosis not present

## 2023-04-19 DIAGNOSIS — D689 Coagulation defect, unspecified: Secondary | ICD-10-CM | POA: Diagnosis not present

## 2023-04-19 DIAGNOSIS — E8770 Fluid overload, unspecified: Secondary | ICD-10-CM | POA: Diagnosis not present

## 2023-04-19 DIAGNOSIS — N186 End stage renal disease: Secondary | ICD-10-CM | POA: Diagnosis not present

## 2023-04-19 DIAGNOSIS — N2581 Secondary hyperparathyroidism of renal origin: Secondary | ICD-10-CM | POA: Diagnosis not present

## 2023-04-19 DIAGNOSIS — Z5181 Encounter for therapeutic drug level monitoring: Secondary | ICD-10-CM | POA: Diagnosis not present

## 2023-04-19 DIAGNOSIS — D688 Other specified coagulation defects: Secondary | ICD-10-CM | POA: Diagnosis not present

## 2023-04-19 DIAGNOSIS — Z992 Dependence on renal dialysis: Secondary | ICD-10-CM | POA: Diagnosis not present

## 2023-04-19 DIAGNOSIS — T8249XA Other complication of vascular dialysis catheter, initial encounter: Secondary | ICD-10-CM | POA: Diagnosis not present

## 2023-04-19 DIAGNOSIS — D631 Anemia in chronic kidney disease: Secondary | ICD-10-CM | POA: Diagnosis not present

## 2023-04-21 DIAGNOSIS — N186 End stage renal disease: Secondary | ICD-10-CM | POA: Diagnosis not present

## 2023-04-21 DIAGNOSIS — E1122 Type 2 diabetes mellitus with diabetic chronic kidney disease: Secondary | ICD-10-CM | POA: Diagnosis not present

## 2023-04-21 DIAGNOSIS — D688 Other specified coagulation defects: Secondary | ICD-10-CM | POA: Diagnosis not present

## 2023-04-21 DIAGNOSIS — A499 Bacterial infection, unspecified: Secondary | ICD-10-CM | POA: Diagnosis not present

## 2023-04-21 DIAGNOSIS — T8249XA Other complication of vascular dialysis catheter, initial encounter: Secondary | ICD-10-CM | POA: Diagnosis not present

## 2023-04-21 DIAGNOSIS — D631 Anemia in chronic kidney disease: Secondary | ICD-10-CM | POA: Diagnosis not present

## 2023-04-21 DIAGNOSIS — D689 Coagulation defect, unspecified: Secondary | ICD-10-CM | POA: Diagnosis not present

## 2023-04-21 DIAGNOSIS — Z992 Dependence on renal dialysis: Secondary | ICD-10-CM | POA: Diagnosis not present

## 2023-04-21 DIAGNOSIS — Z8631 Personal history of diabetic foot ulcer: Secondary | ICD-10-CM | POA: Diagnosis not present

## 2023-04-21 DIAGNOSIS — N2581 Secondary hyperparathyroidism of renal origin: Secondary | ICD-10-CM | POA: Diagnosis not present

## 2023-04-21 DIAGNOSIS — Z5181 Encounter for therapeutic drug level monitoring: Secondary | ICD-10-CM | POA: Diagnosis not present

## 2023-04-21 DIAGNOSIS — E8770 Fluid overload, unspecified: Secondary | ICD-10-CM | POA: Diagnosis not present

## 2023-04-23 DIAGNOSIS — T8249XA Other complication of vascular dialysis catheter, initial encounter: Secondary | ICD-10-CM | POA: Diagnosis not present

## 2023-04-23 DIAGNOSIS — E8770 Fluid overload, unspecified: Secondary | ICD-10-CM | POA: Diagnosis not present

## 2023-04-23 DIAGNOSIS — Z8631 Personal history of diabetic foot ulcer: Secondary | ICD-10-CM | POA: Diagnosis not present

## 2023-04-23 DIAGNOSIS — D631 Anemia in chronic kidney disease: Secondary | ICD-10-CM | POA: Diagnosis not present

## 2023-04-23 DIAGNOSIS — N186 End stage renal disease: Secondary | ICD-10-CM | POA: Diagnosis not present

## 2023-04-23 DIAGNOSIS — D688 Other specified coagulation defects: Secondary | ICD-10-CM | POA: Diagnosis not present

## 2023-04-23 DIAGNOSIS — E1122 Type 2 diabetes mellitus with diabetic chronic kidney disease: Secondary | ICD-10-CM | POA: Diagnosis not present

## 2023-04-23 DIAGNOSIS — N2581 Secondary hyperparathyroidism of renal origin: Secondary | ICD-10-CM | POA: Diagnosis not present

## 2023-04-23 DIAGNOSIS — A499 Bacterial infection, unspecified: Secondary | ICD-10-CM | POA: Diagnosis not present

## 2023-04-23 DIAGNOSIS — D689 Coagulation defect, unspecified: Secondary | ICD-10-CM | POA: Diagnosis not present

## 2023-04-23 DIAGNOSIS — Z992 Dependence on renal dialysis: Secondary | ICD-10-CM | POA: Diagnosis not present

## 2023-04-23 DIAGNOSIS — Z5181 Encounter for therapeutic drug level monitoring: Secondary | ICD-10-CM | POA: Diagnosis not present

## 2023-04-24 DIAGNOSIS — D689 Coagulation defect, unspecified: Secondary | ICD-10-CM | POA: Diagnosis not present

## 2023-04-24 DIAGNOSIS — Z992 Dependence on renal dialysis: Secondary | ICD-10-CM | POA: Diagnosis not present

## 2023-04-24 DIAGNOSIS — Z5181 Encounter for therapeutic drug level monitoring: Secondary | ICD-10-CM | POA: Diagnosis not present

## 2023-04-24 DIAGNOSIS — N186 End stage renal disease: Secondary | ICD-10-CM | POA: Diagnosis not present

## 2023-04-24 DIAGNOSIS — D688 Other specified coagulation defects: Secondary | ICD-10-CM | POA: Diagnosis not present

## 2023-04-24 DIAGNOSIS — A499 Bacterial infection, unspecified: Secondary | ICD-10-CM | POA: Diagnosis not present

## 2023-04-24 DIAGNOSIS — Z8631 Personal history of diabetic foot ulcer: Secondary | ICD-10-CM | POA: Diagnosis not present

## 2023-04-24 DIAGNOSIS — E1122 Type 2 diabetes mellitus with diabetic chronic kidney disease: Secondary | ICD-10-CM | POA: Diagnosis not present

## 2023-04-24 DIAGNOSIS — E8770 Fluid overload, unspecified: Secondary | ICD-10-CM | POA: Diagnosis not present

## 2023-04-24 DIAGNOSIS — T8249XA Other complication of vascular dialysis catheter, initial encounter: Secondary | ICD-10-CM | POA: Diagnosis not present

## 2023-04-24 DIAGNOSIS — N2581 Secondary hyperparathyroidism of renal origin: Secondary | ICD-10-CM | POA: Diagnosis not present

## 2023-04-24 DIAGNOSIS — D631 Anemia in chronic kidney disease: Secondary | ICD-10-CM | POA: Diagnosis not present

## 2023-04-26 ENCOUNTER — Telehealth (INDEPENDENT_AMBULATORY_CARE_PROVIDER_SITE_OTHER): Payer: Self-pay | Admitting: Primary Care

## 2023-04-26 NOTE — Telephone Encounter (Signed)
Gave pt a call back. Pt continued to ask for [facility] and when would she be returning. I told pt as of now I do not have that info and if this was an emergency, he could be seen by our mobile unit. Pt hung up on me.

## 2023-04-28 ENCOUNTER — Telehealth (INDEPENDENT_AMBULATORY_CARE_PROVIDER_SITE_OTHER): Payer: Self-pay | Admitting: Primary Care

## 2023-04-28 DIAGNOSIS — D631 Anemia in chronic kidney disease: Secondary | ICD-10-CM | POA: Diagnosis not present

## 2023-04-28 DIAGNOSIS — I129 Hypertensive chronic kidney disease with stage 1 through stage 4 chronic kidney disease, or unspecified chronic kidney disease: Secondary | ICD-10-CM | POA: Diagnosis not present

## 2023-04-28 DIAGNOSIS — N2581 Secondary hyperparathyroidism of renal origin: Secondary | ICD-10-CM | POA: Diagnosis not present

## 2023-04-28 DIAGNOSIS — D689 Coagulation defect, unspecified: Secondary | ICD-10-CM | POA: Diagnosis not present

## 2023-04-28 DIAGNOSIS — T8249XA Other complication of vascular dialysis catheter, initial encounter: Secondary | ICD-10-CM | POA: Diagnosis not present

## 2023-04-28 DIAGNOSIS — N186 End stage renal disease: Secondary | ICD-10-CM | POA: Diagnosis not present

## 2023-04-28 DIAGNOSIS — D688 Other specified coagulation defects: Secondary | ICD-10-CM | POA: Diagnosis not present

## 2023-04-28 DIAGNOSIS — A499 Bacterial infection, unspecified: Secondary | ICD-10-CM | POA: Diagnosis not present

## 2023-04-28 DIAGNOSIS — Z992 Dependence on renal dialysis: Secondary | ICD-10-CM | POA: Diagnosis not present

## 2023-04-28 DIAGNOSIS — E1122 Type 2 diabetes mellitus with diabetic chronic kidney disease: Secondary | ICD-10-CM | POA: Diagnosis not present

## 2023-04-28 DIAGNOSIS — Z8631 Personal history of diabetic foot ulcer: Secondary | ICD-10-CM | POA: Diagnosis not present

## 2023-04-28 DIAGNOSIS — E8770 Fluid overload, unspecified: Secondary | ICD-10-CM | POA: Diagnosis not present

## 2023-04-28 DIAGNOSIS — Z5181 Encounter for therapeutic drug level monitoring: Secondary | ICD-10-CM | POA: Diagnosis not present

## 2023-04-28 NOTE — Telephone Encounter (Signed)
Spoke to pt and booked apt.

## 2023-05-04 ENCOUNTER — Other Ambulatory Visit (INDEPENDENT_AMBULATORY_CARE_PROVIDER_SITE_OTHER): Payer: Self-pay | Admitting: Primary Care

## 2023-05-05 DIAGNOSIS — N186 End stage renal disease: Secondary | ICD-10-CM | POA: Diagnosis not present

## 2023-05-05 DIAGNOSIS — A499 Bacterial infection, unspecified: Secondary | ICD-10-CM | POA: Diagnosis not present

## 2023-05-05 DIAGNOSIS — N2581 Secondary hyperparathyroidism of renal origin: Secondary | ICD-10-CM | POA: Diagnosis not present

## 2023-05-05 DIAGNOSIS — T8249XA Other complication of vascular dialysis catheter, initial encounter: Secondary | ICD-10-CM | POA: Diagnosis not present

## 2023-05-05 DIAGNOSIS — Z5181 Encounter for therapeutic drug level monitoring: Secondary | ICD-10-CM | POA: Diagnosis not present

## 2023-05-05 DIAGNOSIS — D631 Anemia in chronic kidney disease: Secondary | ICD-10-CM | POA: Diagnosis not present

## 2023-05-05 DIAGNOSIS — D689 Coagulation defect, unspecified: Secondary | ICD-10-CM | POA: Diagnosis not present

## 2023-05-05 DIAGNOSIS — Z992 Dependence on renal dialysis: Secondary | ICD-10-CM | POA: Diagnosis not present

## 2023-05-06 NOTE — Telephone Encounter (Signed)
Requested Prescriptions  Pending Prescriptions Disp Refills   gabapentin (NEURONTIN) 100 MG capsule [Pharmacy Med Name: Gabapentin 100 MG Oral Capsule] 90 capsule 0    Sig: TAKE 1 CAPSULE BY MOUTH THREE TIMES DAILY     Neurology: Anticonvulsants - gabapentin Failed - 05/04/2023  5:02 PM      Failed - Cr in normal range and within 360 days    Creatinine  Date Value Ref Range Status  08/22/2014 5.00 (H) 0.60 - 1.30 mg/dL Final   Creatinine, Ser  Date Value Ref Range Status  04/13/2023 8.14 (H) 0.61 - 1.24 mg/dL Final         Passed - Completed PHQ-2 or PHQ-9 in the last 360 days      Passed - Valid encounter within last 12 months    Recent Outpatient Visits           4 months ago 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)   [place] [facility] [name], NP   10 months ago Type 1 diabetes mellitus with complication, on long-term current use of insulin ([name])   [place] [facility] [name], NP   2 years ago Hx of above knee amputation, left [facility])   [place] [facility] [name], NP   2 years ago Type 1 diabetes mellitus with other circulatory complication [facility])   [place] [facility] [name], NP   3 years ago Type 1 diabetes mellitus with other circulatory complication [facility])   [place] [facility] [name], NP       Future Appointments             In 4 days [name] Bores, Larena Glassman, DPM [facility] at [place], [place]   In 1 week Randa Evens, Kinnie Scales, NP [place] [facility]

## 2023-05-10 ENCOUNTER — Ambulatory Visit: Payer: Medicare HMO | Admitting: Podiatry

## 2023-05-10 DIAGNOSIS — Z992 Dependence on renal dialysis: Secondary | ICD-10-CM | POA: Diagnosis not present

## 2023-05-10 DIAGNOSIS — N2581 Secondary hyperparathyroidism of renal origin: Secondary | ICD-10-CM | POA: Diagnosis not present

## 2023-05-10 DIAGNOSIS — A499 Bacterial infection, unspecified: Secondary | ICD-10-CM | POA: Diagnosis not present

## 2023-05-10 DIAGNOSIS — D689 Coagulation defect, unspecified: Secondary | ICD-10-CM | POA: Diagnosis not present

## 2023-05-10 DIAGNOSIS — Z5181 Encounter for therapeutic drug level monitoring: Secondary | ICD-10-CM | POA: Diagnosis not present

## 2023-05-10 DIAGNOSIS — N186 End stage renal disease: Secondary | ICD-10-CM | POA: Diagnosis not present

## 2023-05-10 DIAGNOSIS — D631 Anemia in chronic kidney disease: Secondary | ICD-10-CM | POA: Diagnosis not present

## 2023-05-10 DIAGNOSIS — T8249XA Other complication of vascular dialysis catheter, initial encounter: Secondary | ICD-10-CM | POA: Diagnosis not present

## 2023-05-12 ENCOUNTER — Other Ambulatory Visit (INDEPENDENT_AMBULATORY_CARE_PROVIDER_SITE_OTHER): Payer: Self-pay | Admitting: Primary Care

## 2023-05-12 DIAGNOSIS — E1022 Type 1 diabetes mellitus with diabetic chronic kidney disease: Secondary | ICD-10-CM

## 2023-05-12 MED ORDER — ONETOUCH VERIO VI STRP
ORAL_STRIP | 11 refills | Status: DC
Start: 2023-05-12 — End: 2023-08-27

## 2023-05-12 NOTE — Telephone Encounter (Signed)
Patient requesting a test strip that is not in the chart he  pronounced it  "VARISAL"  he may have meant VERIO  but patient was very upset  This is the only thing I see in his chart   Medication Refill - Medication: glucose blood (ONETOUCH VERIO) test strip [[id number]]   [place]   Has the patient contacted their pharmacy? Yes.      Preferred Pharmacy (with phone number or street name):  [facility] [address] [place], [name] - [address] [place], [name] [address] Phone: [phone]  Fax: [phone] DEA #: --  DAW Reason: --       Has the patient been seen for an appointment in the last year OR does the patient have an upcoming appointment? Yes.    Agent: Please be advised that RX refills may take up to 3 business days. We ask that you follow-up with your pharmacy.

## 2023-05-12 NOTE — Telephone Encounter (Signed)
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)   [place] [facility] [name], NP   10 months ago Type 1 diabetes mellitus with complication, on long-term current use of insulin ([name])   [place] [facility] [name], NP   2 years ago Hx of above knee amputation, left [facility])   [place] [facility] [name], NP   2 years ago Type 1 diabetes mellitus with other circulatory complication [facility])   [place] [facility] [name], NP   3 years ago Type 1 diabetes mellitus with other circulatory complication [facility])   [place] [facility] [name], NP       Future Appointments             Tomorrow [name], NP [place] [facility]

## 2023-05-13 ENCOUNTER — Ambulatory Visit (INDEPENDENT_AMBULATORY_CARE_PROVIDER_SITE_OTHER): Payer: Medicare HMO | Admitting: Primary Care

## 2023-05-14 DIAGNOSIS — T8249XA Other complication of vascular dialysis catheter, initial encounter: Secondary | ICD-10-CM | POA: Diagnosis not present

## 2023-05-14 DIAGNOSIS — N2581 Secondary hyperparathyroidism of renal origin: Secondary | ICD-10-CM | POA: Diagnosis not present

## 2023-05-14 DIAGNOSIS — D689 Coagulation defect, unspecified: Secondary | ICD-10-CM | POA: Diagnosis not present

## 2023-05-14 DIAGNOSIS — D631 Anemia in chronic kidney disease: Secondary | ICD-10-CM | POA: Diagnosis not present

## 2023-05-14 DIAGNOSIS — Z5181 Encounter for therapeutic drug level monitoring: Secondary | ICD-10-CM | POA: Diagnosis not present

## 2023-05-14 DIAGNOSIS — Z992 Dependence on renal dialysis: Secondary | ICD-10-CM | POA: Diagnosis not present

## 2023-05-14 DIAGNOSIS — N186 End stage renal disease: Secondary | ICD-10-CM | POA: Diagnosis not present

## 2023-05-14 DIAGNOSIS — A499 Bacterial infection, unspecified: Secondary | ICD-10-CM | POA: Diagnosis not present

## 2023-05-17 DIAGNOSIS — N2581 Secondary hyperparathyroidism of renal origin: Secondary | ICD-10-CM | POA: Diagnosis not present

## 2023-05-17 DIAGNOSIS — D631 Anemia in chronic kidney disease: Secondary | ICD-10-CM | POA: Diagnosis not present

## 2023-05-17 DIAGNOSIS — Z992 Dependence on renal dialysis: Secondary | ICD-10-CM | POA: Diagnosis not present

## 2023-05-17 DIAGNOSIS — Z5181 Encounter for therapeutic drug level monitoring: Secondary | ICD-10-CM | POA: Diagnosis not present

## 2023-05-17 DIAGNOSIS — A499 Bacterial infection, unspecified: Secondary | ICD-10-CM | POA: Diagnosis not present

## 2023-05-17 DIAGNOSIS — N186 End stage renal disease: Secondary | ICD-10-CM | POA: Diagnosis not present

## 2023-05-17 DIAGNOSIS — D689 Coagulation defect, unspecified: Secondary | ICD-10-CM | POA: Diagnosis not present

## 2023-05-17 DIAGNOSIS — T8249XA Other complication of vascular dialysis catheter, initial encounter: Secondary | ICD-10-CM | POA: Diagnosis not present

## 2023-05-21 DIAGNOSIS — T8249XA Other complication of vascular dialysis catheter, initial encounter: Secondary | ICD-10-CM | POA: Diagnosis not present

## 2023-05-21 DIAGNOSIS — Z992 Dependence on renal dialysis: Secondary | ICD-10-CM | POA: Diagnosis not present

## 2023-05-21 DIAGNOSIS — A499 Bacterial infection, unspecified: Secondary | ICD-10-CM | POA: Diagnosis not present

## 2023-05-21 DIAGNOSIS — N2581 Secondary hyperparathyroidism of renal origin: Secondary | ICD-10-CM | POA: Diagnosis not present

## 2023-05-21 DIAGNOSIS — D689 Coagulation defect, unspecified: Secondary | ICD-10-CM | POA: Diagnosis not present

## 2023-05-21 DIAGNOSIS — Z5181 Encounter for therapeutic drug level monitoring: Secondary | ICD-10-CM | POA: Diagnosis not present

## 2023-05-21 DIAGNOSIS — D631 Anemia in chronic kidney disease: Secondary | ICD-10-CM | POA: Diagnosis not present

## 2023-05-21 DIAGNOSIS — N186 End stage renal disease: Secondary | ICD-10-CM | POA: Diagnosis not present

## 2023-05-22 DIAGNOSIS — N186 End stage renal disease: Secondary | ICD-10-CM | POA: Diagnosis not present

## 2023-05-22 DIAGNOSIS — D689 Coagulation defect, unspecified: Secondary | ICD-10-CM | POA: Diagnosis not present

## 2023-05-22 DIAGNOSIS — Z5181 Encounter for therapeutic drug level monitoring: Secondary | ICD-10-CM | POA: Diagnosis not present

## 2023-05-22 DIAGNOSIS — N2581 Secondary hyperparathyroidism of renal origin: Secondary | ICD-10-CM | POA: Diagnosis not present

## 2023-05-22 DIAGNOSIS — D631 Anemia in chronic kidney disease: Secondary | ICD-10-CM | POA: Diagnosis not present

## 2023-05-22 DIAGNOSIS — Z992 Dependence on renal dialysis: Secondary | ICD-10-CM | POA: Diagnosis not present

## 2023-05-22 DIAGNOSIS — A499 Bacterial infection, unspecified: Secondary | ICD-10-CM | POA: Diagnosis not present

## 2023-05-22 DIAGNOSIS — T8249XA Other complication of vascular dialysis catheter, initial encounter: Secondary | ICD-10-CM | POA: Diagnosis not present

## 2023-05-24 DIAGNOSIS — N186 End stage renal disease: Secondary | ICD-10-CM | POA: Diagnosis not present

## 2023-05-24 DIAGNOSIS — Z5181 Encounter for therapeutic drug level monitoring: Secondary | ICD-10-CM | POA: Diagnosis not present

## 2023-05-24 DIAGNOSIS — A499 Bacterial infection, unspecified: Secondary | ICD-10-CM | POA: Diagnosis not present

## 2023-05-24 DIAGNOSIS — D631 Anemia in chronic kidney disease: Secondary | ICD-10-CM | POA: Diagnosis not present

## 2023-05-24 DIAGNOSIS — T8249XA Other complication of vascular dialysis catheter, initial encounter: Secondary | ICD-10-CM | POA: Diagnosis not present

## 2023-05-24 DIAGNOSIS — Z992 Dependence on renal dialysis: Secondary | ICD-10-CM | POA: Diagnosis not present

## 2023-05-24 DIAGNOSIS — D689 Coagulation defect, unspecified: Secondary | ICD-10-CM | POA: Diagnosis not present

## 2023-05-24 DIAGNOSIS — N2581 Secondary hyperparathyroidism of renal origin: Secondary | ICD-10-CM | POA: Diagnosis not present

## 2023-05-27 DIAGNOSIS — A499 Bacterial infection, unspecified: Secondary | ICD-10-CM | POA: Diagnosis not present

## 2023-05-27 DIAGNOSIS — Z5181 Encounter for therapeutic drug level monitoring: Secondary | ICD-10-CM | POA: Diagnosis not present

## 2023-05-27 DIAGNOSIS — N186 End stage renal disease: Secondary | ICD-10-CM | POA: Diagnosis not present

## 2023-05-27 DIAGNOSIS — T8249XA Other complication of vascular dialysis catheter, initial encounter: Secondary | ICD-10-CM | POA: Diagnosis not present

## 2023-05-27 DIAGNOSIS — N2581 Secondary hyperparathyroidism of renal origin: Secondary | ICD-10-CM | POA: Diagnosis not present

## 2023-05-27 DIAGNOSIS — Z992 Dependence on renal dialysis: Secondary | ICD-10-CM | POA: Diagnosis not present

## 2023-05-27 DIAGNOSIS — D631 Anemia in chronic kidney disease: Secondary | ICD-10-CM | POA: Diagnosis not present

## 2023-05-27 DIAGNOSIS — D689 Coagulation defect, unspecified: Secondary | ICD-10-CM | POA: Diagnosis not present

## 2023-05-28 DIAGNOSIS — T8249XA Other complication of vascular dialysis catheter, initial encounter: Secondary | ICD-10-CM | POA: Diagnosis not present

## 2023-05-28 DIAGNOSIS — A499 Bacterial infection, unspecified: Secondary | ICD-10-CM | POA: Diagnosis not present

## 2023-05-28 DIAGNOSIS — Z992 Dependence on renal dialysis: Secondary | ICD-10-CM | POA: Diagnosis not present

## 2023-05-28 DIAGNOSIS — D689 Coagulation defect, unspecified: Secondary | ICD-10-CM | POA: Diagnosis not present

## 2023-05-28 DIAGNOSIS — N2581 Secondary hyperparathyroidism of renal origin: Secondary | ICD-10-CM | POA: Diagnosis not present

## 2023-05-28 DIAGNOSIS — D631 Anemia in chronic kidney disease: Secondary | ICD-10-CM | POA: Diagnosis not present

## 2023-05-28 DIAGNOSIS — Z5181 Encounter for therapeutic drug level monitoring: Secondary | ICD-10-CM | POA: Diagnosis not present

## 2023-05-28 DIAGNOSIS — N186 End stage renal disease: Secondary | ICD-10-CM | POA: Diagnosis not present

## 2023-05-29 DIAGNOSIS — N186 End stage renal disease: Secondary | ICD-10-CM | POA: Diagnosis not present

## 2023-05-29 DIAGNOSIS — I129 Hypertensive chronic kidney disease with stage 1 through stage 4 chronic kidney disease, or unspecified chronic kidney disease: Secondary | ICD-10-CM | POA: Diagnosis not present

## 2023-05-29 DIAGNOSIS — Z992 Dependence on renal dialysis: Secondary | ICD-10-CM | POA: Diagnosis not present

## 2023-05-31 DIAGNOSIS — N186 End stage renal disease: Secondary | ICD-10-CM | POA: Diagnosis not present

## 2023-05-31 DIAGNOSIS — N2581 Secondary hyperparathyroidism of renal origin: Secondary | ICD-10-CM | POA: Diagnosis not present

## 2023-05-31 DIAGNOSIS — T8249XA Other complication of vascular dialysis catheter, initial encounter: Secondary | ICD-10-CM | POA: Diagnosis not present

## 2023-05-31 DIAGNOSIS — Z992 Dependence on renal dialysis: Secondary | ICD-10-CM | POA: Diagnosis not present

## 2023-05-31 DIAGNOSIS — D689 Coagulation defect, unspecified: Secondary | ICD-10-CM | POA: Diagnosis not present

## 2023-06-01 ENCOUNTER — Other Ambulatory Visit (INDEPENDENT_AMBULATORY_CARE_PROVIDER_SITE_OTHER): Payer: Self-pay | Admitting: Primary Care

## 2023-06-02 ENCOUNTER — Other Ambulatory Visit (INDEPENDENT_AMBULATORY_CARE_PROVIDER_SITE_OTHER): Payer: Self-pay | Admitting: Primary Care

## 2023-06-02 DIAGNOSIS — N186 End stage renal disease: Secondary | ICD-10-CM | POA: Diagnosis not present

## 2023-06-02 DIAGNOSIS — N2581 Secondary hyperparathyroidism of renal origin: Secondary | ICD-10-CM | POA: Diagnosis not present

## 2023-06-02 DIAGNOSIS — D689 Coagulation defect, unspecified: Secondary | ICD-10-CM | POA: Diagnosis not present

## 2023-06-02 DIAGNOSIS — Z992 Dependence on renal dialysis: Secondary | ICD-10-CM | POA: Diagnosis not present

## 2023-06-02 DIAGNOSIS — T8249XA Other complication of vascular dialysis catheter, initial encounter: Secondary | ICD-10-CM | POA: Diagnosis not present

## 2023-06-02 NOTE — Telephone Encounter (Signed)
Pt called once again requesting that the Rx for gabapentin (NEURONTIN) 100 MG capsule  be sent to his pharmacy asap so it can be filled.

## 2023-06-02 NOTE — Telephone Encounter (Signed)
Medication Refill - Medication:  gabapentin (NEURONTIN) 100 MG capsule [[id number]]   Has the patient contacted their pharmacy? Yes.    (Agent: If yes, when and what did the pharmacy advise?) Contact PCP   Preferred Pharmacy (with phone number or street name): [facility] [address] [place]   Has the patient been seen for an appointment in the last year OR does the patient have an upcoming appointment? Yes.    Agent: Please be advised that RX refills may take up to 3 business days. We ask that you follow-up with your pharmacy.

## 2023-06-03 ENCOUNTER — Telehealth (INDEPENDENT_AMBULATORY_CARE_PROVIDER_SITE_OTHER): Payer: Self-pay | Admitting: Primary Care

## 2023-06-03 ENCOUNTER — Other Ambulatory Visit (INDEPENDENT_AMBULATORY_CARE_PROVIDER_SITE_OTHER): Payer: Self-pay | Admitting: Primary Care

## 2023-06-03 MED ORDER — GABAPENTIN 100 MG PO CAPS: 100.0000 mg | ORAL_CAPSULE | Freq: Three times a day (TID) | ORAL | 0 refills | Status: DC

## 2023-06-03 NOTE — Telephone Encounter (Signed)
Requested medications are due for refill today.  yes   Requested medications are on the active medications list.  yes   Last refill. 05/06/2023 #90 0 rf   Future visit scheduled.   yes   Notes to clinic.  Refill was recently refused as " not appropriate". Unclear why. Please review for refill.     Requested Prescriptions  Pending Prescriptions Disp Refills   gabapentin (NEURONTIN) 100 MG capsule 90 capsule 0    Sig: Take 1 capsule (100 mg total) by mouth 3 (three) times daily.     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)   [place] [facility] [name], NP   11 months ago Type 1 diabetes mellitus with complication, on long-term current use of insulin ([name])   [place] [facility] [name], NP   2 years ago Hx of above knee amputation, left [facility])   [place] [facility] [name], NP   2 years ago Type 1 diabetes mellitus with other circulatory complication [facility])   [place] [facility] [name], NP   3 years ago Type 1 diabetes mellitus with other circulatory complication [facility])   [place] [facility] [name], NP       Future Appointments             In 5 days Randa Evens, Kinnie Scales, NP [place] [facility]

## 2023-06-03 NOTE — Telephone Encounter (Signed)
Requested medications are due for refill today.  yes  Requested medications are on the active medications list.  yes  Last refill. 05/06/2023 #90 0 rf  Future visit scheduled.   yes  Notes to clinic.  Rx was recently refused  - reason" refill not appropriate". Unclear for the reason. Please review for refill.    Requested Prescriptions  Pending Prescriptions Disp Refills   gabapentin (NEURONTIN) 100 MG capsule [Pharmacy Med Name: Gabapentin 100 MG Oral Capsule] 90 capsule 0    Sig: TAKE 1 CAPSULE BY MOUTH THREE TIMES DAILY     Neurology: Anticonvulsants - gabapentin Failed - 06/02/2023  3:35 PM      Failed - Cr in normal range and within 360 days    Creatinine  Date Value Ref Range Status  08/22/2014 5.00 (H) 0.60 - 1.30 mg/dL Final   Creatinine, Ser  Date Value Ref Range Status  04/13/2023 8.14 (H) 0.61 - 1.24 mg/dL Final         Passed - Completed PHQ-2 or PHQ-9 in the last 360 days      Passed - Valid encounter within last 12 months    Recent Outpatient Visits           5 months ago 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)   [place] [facility] [name], NP   11 months ago Type 1 diabetes mellitus with complication, on long-term current use of insulin ([name])   [place] [facility] [name], NP   2 years ago Hx of above knee amputation, left [facility])   [place] [facility] [name], NP   2 years ago Type 1 diabetes mellitus with other circulatory complication [facility])   [place] [facility] [name], NP   3 years ago Type 1 diabetes mellitus with other circulatory complication [facility])   [place] [facility] [name], NP       Future Appointments             In 5 days Randa Evens, Kinnie Scales, NP [place] [facility]

## 2023-06-03 NOTE — Telephone Encounter (Signed)
Pt called and demanded to be transferred directly to office.  When I ask if I cold help him, he said they know what I'm calling about, they know me. Attempted to contact office X 3 w/ no answer. Advised pt I could not get anyone.  He said I need my "f...ing" medicine.  Pt continued to.. use the "f" word yelling in the phone and hung up before I could say anything else.

## 2023-06-04 DIAGNOSIS — Z992 Dependence on renal dialysis: Secondary | ICD-10-CM | POA: Diagnosis not present

## 2023-06-04 DIAGNOSIS — T8249XA Other complication of vascular dialysis catheter, initial encounter: Secondary | ICD-10-CM | POA: Diagnosis not present

## 2023-06-04 DIAGNOSIS — D689 Coagulation defect, unspecified: Secondary | ICD-10-CM | POA: Diagnosis not present

## 2023-06-04 DIAGNOSIS — N2581 Secondary hyperparathyroidism of renal origin: Secondary | ICD-10-CM | POA: Diagnosis not present

## 2023-06-04 DIAGNOSIS — N186 End stage renal disease: Secondary | ICD-10-CM | POA: Diagnosis not present

## 2023-06-04 NOTE — Telephone Encounter (Signed)
Please see previous TE, patient is upset about not receiving medication. Please advise.

## 2023-06-04 NOTE — Telephone Encounter (Signed)
Returned pt call to see what is going on. Pt states this is an old message pt received his Gabapentin

## 2023-06-07 DIAGNOSIS — N186 End stage renal disease: Secondary | ICD-10-CM | POA: Diagnosis not present

## 2023-06-07 DIAGNOSIS — Z992 Dependence on renal dialysis: Secondary | ICD-10-CM | POA: Diagnosis not present

## 2023-06-07 DIAGNOSIS — T8249XA Other complication of vascular dialysis catheter, initial encounter: Secondary | ICD-10-CM | POA: Diagnosis not present

## 2023-06-07 DIAGNOSIS — N2581 Secondary hyperparathyroidism of renal origin: Secondary | ICD-10-CM | POA: Diagnosis not present

## 2023-06-07 DIAGNOSIS — D689 Coagulation defect, unspecified: Secondary | ICD-10-CM | POA: Diagnosis not present

## 2023-06-08 ENCOUNTER — Ambulatory Visit (INDEPENDENT_AMBULATORY_CARE_PROVIDER_SITE_OTHER): Payer: Medicare HMO | Admitting: Primary Care

## 2023-06-17 ENCOUNTER — Ambulatory Visit (INDEPENDENT_AMBULATORY_CARE_PROVIDER_SITE_OTHER): Payer: Medicare HMO | Admitting: Primary Care

## 2023-06-22 ENCOUNTER — Ambulatory Visit (INDEPENDENT_AMBULATORY_CARE_PROVIDER_SITE_OTHER): Payer: Medicare HMO | Admitting: Primary Care

## 2023-06-28 DIAGNOSIS — N186 End stage renal disease: Secondary | ICD-10-CM | POA: Diagnosis not present

## 2023-06-28 DIAGNOSIS — Z992 Dependence on renal dialysis: Secondary | ICD-10-CM | POA: Diagnosis not present

## 2023-06-28 DIAGNOSIS — I129 Hypertensive chronic kidney disease with stage 1 through stage 4 chronic kidney disease, or unspecified chronic kidney disease: Secondary | ICD-10-CM | POA: Diagnosis not present

## 2023-07-03 ENCOUNTER — Other Ambulatory Visit (INDEPENDENT_AMBULATORY_CARE_PROVIDER_SITE_OTHER): Payer: Self-pay | Admitting: Primary Care

## 2023-07-05 ENCOUNTER — Telehealth (INDEPENDENT_AMBULATORY_CARE_PROVIDER_SITE_OTHER): Payer: Self-pay | Admitting: Primary Care

## 2023-07-05 ENCOUNTER — Telehealth: Payer: Self-pay

## 2023-07-05 NOTE — Telephone Encounter (Signed)
Copied from CRM [id number]. Topic: General - Inquiry >> Jul 05, 2023  2:13 PM Runell Gess P wrote: Reason for CRM: Pt called asking that a nurse call him back.  He said he needs refills today on his pain meds for the gastroparesis, test strips, and also he has COVID and would like to know the quarantine time for it.  He did not want to talk to the triage nurse.   He wanted to talk to someone in the office.  [phone]

## 2023-07-05 NOTE — Telephone Encounter (Signed)
Requested Prescriptions  Pending Prescriptions Disp Refills   NOVOLIN R 100 UNIT/ML injection [Pharmacy Med Name: NovoLIN R 100 UNIT/ML Injection Solution] 10 mL 0    Sig: INJECT 12 UNITS SUBCUTANEOUSLY THREE TIMES DAILY BEFORE MEAL(S)     Endocrinology:  Diabetes - Insulins Failed - 07/03/2023  2:23 PM      Failed - HBA1C is between 0 and 7.9 and within 180 days    Hemoglobin A1C  Date Value Ref Range Status  11/11/2022 9.5  Final   Hgb A1c MFr Bld  Date Value Ref Range Status  04/12/2023 8.7 (H) 4.8 - 5.6 % Final    Comment:    (NOTE) Pre diabetes:          5.7%-6.4%  Diabetes:              >6.4%  Glycemic control for   <7.0% adults with diabetes          Passed - Valid encounter within last 6 months    Recent Outpatient Visits           6 months ago 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)   [place] [facility] [name], NP   1 year ago Type 1 diabetes mellitus with complication, on long-term current use of insulin ([name])   [place] [facility] [name], NP   2 years ago Hx of above knee amputation, left [facility])   [place] [facility] [name], NP   2 years ago Type 1 diabetes mellitus with other circulatory complication ([name])   [place] [facility] [name], NP   3 years ago Type 1 diabetes mellitus with other circulatory complication ([name])   [place] [facility] [name], NP               gabapentin (NEURONTIN) 100 MG capsule [Pharmacy Med Name: Gabapentin 100 MG Oral Capsule] 270 capsule 0    Sig: TAKE 1 CAPSULE BY MOUTH THREE TIMES DAILY     Neurology: Anticonvulsants - gabapentin Failed - 07/03/2023  2:23 PM      Failed - Cr in normal range and within 360 days    Creatinine  Date Value Ref Range Status  08/22/2014 5.00 (H) 0.60 - 1.30 mg/dL Final   Creatinine, Ser  Date Value  Ref Range Status  04/13/2023 8.14 (H) 0.61 - 1.24 mg/dL Final         Passed - Completed PHQ-2 or PHQ-9 in the last 360 days      Passed - Valid encounter within last 12 months    Recent Outpatient Visits           6 months ago 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)   [place] [facility] [name], NP   1 year ago Type 1 diabetes mellitus with complication, on long-term current use of insulin ([name])   [place] [facility] [name], NP   2 years ago Hx of above knee amputation, left [facility])   [place] [facility] [name], NP   2 years ago Type 1 diabetes mellitus with other circulatory complication [facility])   [place] [facility] [name], NP   3 years ago Type 1 diabetes mellitus with other circulatory complication [facility])   [place] [facility] [name], NP

## 2023-07-05 NOTE — Telephone Encounter (Incomplete)
Medication Refill - Medication: ***  Has the patient contacted their pharmacy? {yes [id number] (Agent: If no, request that the patient contact the pharmacy for the refill. If patient does not wish to contact the pharmacy document the reason why and proceed with request.) (Agent: If yes, when and what did the pharmacy advise?)  Preferred Pharmacy (with phone number or street name): *** Has the patient been seen for an appointment in the last year OR does the patient have an upcoming appointment? {yes [id number]  Agent: Please be advised that RX refills may take up to 3 business days. We ask that you follow-up with your pharmacy.

## 2023-07-05 NOTE — Telephone Encounter (Signed)
Tried contacting pt to see when he tested positive for COVID. Pt didn't answer was unable to lvm

## 2023-07-07 NOTE — Telephone Encounter (Signed)
Returned pt call. Pt states he has been drinking fluids. He has been trying to eat little of something like chicken noodle   Pt states he has made dialysis aware and they told him to mask up. Pt states he didn't take a test. Pt states he had covid twice so he knows the symptoms.   Pt denies fever and headaches Pt states he has cough, chills.  Pt states he hasn't taking anything for his symptoms

## 2023-07-07 NOTE — Telephone Encounter (Signed)
Provider spoke with pt

## 2023-07-08 ENCOUNTER — Telehealth (INDEPENDENT_AMBULATORY_CARE_PROVIDER_SITE_OTHER): Payer: Medicare HMO | Admitting: Primary Care

## 2023-07-08 ENCOUNTER — Encounter (INDEPENDENT_AMBULATORY_CARE_PROVIDER_SITE_OTHER): Payer: Self-pay | Admitting: Primary Care

## 2023-07-08 NOTE — Progress Notes (Unsigned)
  [facility]   Telephone Note  I  tried to connected with [name], on 07/08/2023 at 3:33 PM and 3:35 by telephone    [name], NP 07/08/2023, 3:33 PM

## 2023-07-09 ENCOUNTER — Inpatient Hospital Stay (HOSPITAL_COMMUNITY)
Admission: EM | Admit: 2023-07-09 | Discharge: 2023-08-27 | DRG: 314 | Disposition: A | Discharge status: Other Acute Inpatient Hospital | Payer: Medicare HMO | Attending: Internal Medicine | Admitting: Internal Medicine

## 2023-07-09 ENCOUNTER — Other Ambulatory Visit (HOSPITAL_COMMUNITY): Payer: Medicare HMO

## 2023-07-09 ENCOUNTER — Emergency Department (HOSPITAL_COMMUNITY): Payer: Medicare HMO

## 2023-07-09 ENCOUNTER — Other Ambulatory Visit: Payer: Self-pay

## 2023-07-09 DIAGNOSIS — I76 Septic arterial embolism: Secondary | ICD-10-CM | POA: Diagnosis not present

## 2023-07-09 DIAGNOSIS — D62 Acute posthemorrhagic anemia: Secondary | ICD-10-CM | POA: Diagnosis not present

## 2023-07-09 DIAGNOSIS — G060 Intracranial abscess and granuloma: Secondary | ICD-10-CM

## 2023-07-09 DIAGNOSIS — I10 Essential (primary) hypertension: Secondary | ICD-10-CM | POA: Diagnosis not present

## 2023-07-09 DIAGNOSIS — D638 Anemia in other chronic diseases classified elsewhere: Secondary | ICD-10-CM | POA: Diagnosis present

## 2023-07-09 DIAGNOSIS — D6959 Other secondary thrombocytopenia: Secondary | ICD-10-CM | POA: Diagnosis present

## 2023-07-09 DIAGNOSIS — I82B12 Acute embolism and thrombosis of left subclavian vein: Secondary | ICD-10-CM | POA: Diagnosis not present

## 2023-07-09 DIAGNOSIS — E878 Other disorders of electrolyte and fluid balance, not elsewhere classified: Secondary | ICD-10-CM | POA: Diagnosis not present

## 2023-07-09 DIAGNOSIS — J189 Pneumonia, unspecified organism: Secondary | ICD-10-CM

## 2023-07-09 DIAGNOSIS — T80211A Bloodstream infection due to central venous catheter, initial encounter: Secondary | ICD-10-CM | POA: Diagnosis not present

## 2023-07-09 DIAGNOSIS — E1110 Type 2 diabetes mellitus with ketoacidosis without coma: Secondary | ICD-10-CM | POA: Diagnosis not present

## 2023-07-09 DIAGNOSIS — I5021 Acute systolic (congestive) heart failure: Secondary | ICD-10-CM | POA: Diagnosis present

## 2023-07-09 DIAGNOSIS — L89312 Pressure ulcer of right buttock, stage 2: Secondary | ICD-10-CM | POA: Diagnosis not present

## 2023-07-09 DIAGNOSIS — Z1152 Encounter for screening for COVID-19: Secondary | ICD-10-CM

## 2023-07-09 DIAGNOSIS — R739 Hyperglycemia, unspecified: Secondary | ICD-10-CM | POA: Diagnosis not present

## 2023-07-09 DIAGNOSIS — N186 End stage renal disease: Secondary | ICD-10-CM | POA: Diagnosis present

## 2023-07-09 DIAGNOSIS — M25521 Pain in right elbow: Secondary | ICD-10-CM | POA: Diagnosis not present

## 2023-07-09 DIAGNOSIS — E875 Hyperkalemia: Secondary | ICD-10-CM | POA: Diagnosis not present

## 2023-07-09 DIAGNOSIS — I420 Dilated cardiomyopathy: Secondary | ICD-10-CM | POA: Diagnosis present

## 2023-07-09 DIAGNOSIS — K3184 Gastroparesis: Secondary | ICD-10-CM | POA: Diagnosis present

## 2023-07-09 DIAGNOSIS — R7881 Bacteremia: Secondary | ICD-10-CM | POA: Diagnosis present

## 2023-07-09 DIAGNOSIS — I69391 Dysphagia following cerebral infarction: Secondary | ICD-10-CM

## 2023-07-09 DIAGNOSIS — Z8614 Personal history of Methicillin resistant Staphylococcus aureus infection: Secondary | ICD-10-CM

## 2023-07-09 DIAGNOSIS — Z89422 Acquired absence of other left toe(s): Secondary | ICD-10-CM

## 2023-07-09 DIAGNOSIS — I517 Cardiomegaly: Secondary | ICD-10-CM | POA: Diagnosis not present

## 2023-07-09 DIAGNOSIS — I132 Hypertensive heart and chronic kidney disease with heart failure and with stage 5 chronic kidney disease, or end stage renal disease: Secondary | ICD-10-CM | POA: Diagnosis present

## 2023-07-09 DIAGNOSIS — Z6822 Body mass index (BMI) 22.0-22.9, adult: Secondary | ICD-10-CM

## 2023-07-09 DIAGNOSIS — L97519 Non-pressure chronic ulcer of other part of right foot with unspecified severity: Secondary | ICD-10-CM | POA: Diagnosis present

## 2023-07-09 DIAGNOSIS — Y92239 Unspecified place in hospital as the place of occurrence of the external cause: Secondary | ICD-10-CM | POA: Diagnosis not present

## 2023-07-09 DIAGNOSIS — E860 Dehydration: Secondary | ICD-10-CM | POA: Diagnosis present

## 2023-07-09 DIAGNOSIS — Z841 Family history of disorders of kidney and ureter: Secondary | ICD-10-CM

## 2023-07-09 DIAGNOSIS — I330 Acute and subacute infective endocarditis: Secondary | ICD-10-CM | POA: Diagnosis present

## 2023-07-09 DIAGNOSIS — R0989 Other specified symptoms and signs involving the circulatory and respiratory systems: Secondary | ICD-10-CM | POA: Diagnosis not present

## 2023-07-09 DIAGNOSIS — G9341 Metabolic encephalopathy: Secondary | ICD-10-CM | POA: Diagnosis present

## 2023-07-09 DIAGNOSIS — I2690 Septic pulmonary embolism without acute cor pulmonale: Secondary | ICD-10-CM | POA: Diagnosis present

## 2023-07-09 DIAGNOSIS — I059 Rheumatic mitral valve disease, unspecified: Secondary | ICD-10-CM | POA: Diagnosis present

## 2023-07-09 DIAGNOSIS — B9562 Methicillin resistant Staphylococcus aureus infection as the cause of diseases classified elsewhere: Secondary | ICD-10-CM | POA: Diagnosis present

## 2023-07-09 DIAGNOSIS — I4719 Other supraventricular tachycardia: Secondary | ICD-10-CM | POA: Diagnosis not present

## 2023-07-09 DIAGNOSIS — I5043 Acute on chronic combined systolic (congestive) and diastolic (congestive) heart failure: Secondary | ICD-10-CM | POA: Diagnosis present

## 2023-07-09 DIAGNOSIS — Z781 Physical restraint status: Secondary | ICD-10-CM

## 2023-07-09 DIAGNOSIS — I6340 Cerebral infarction due to embolism of unspecified cerebral artery: Secondary | ICD-10-CM

## 2023-07-09 DIAGNOSIS — R159 Full incontinence of feces: Secondary | ICD-10-CM | POA: Diagnosis not present

## 2023-07-09 DIAGNOSIS — Z79899 Other long term (current) drug therapy: Secondary | ICD-10-CM

## 2023-07-09 DIAGNOSIS — Y848 Other medical procedures as the cause of abnormal reaction of the patient, or of later complication, without mention of misadventure at the time of the procedure: Secondary | ICD-10-CM | POA: Diagnosis not present

## 2023-07-09 DIAGNOSIS — E109 Type 1 diabetes mellitus without complications: Secondary | ICD-10-CM | POA: Diagnosis present

## 2023-07-09 DIAGNOSIS — I82A12 Acute embolism and thrombosis of left axillary vein: Secondary | ICD-10-CM | POA: Diagnosis not present

## 2023-07-09 DIAGNOSIS — T8242XA Displacement of vascular dialysis catheter, initial encounter: Secondary | ICD-10-CM | POA: Diagnosis not present

## 2023-07-09 DIAGNOSIS — D509 Iron deficiency anemia, unspecified: Secondary | ICD-10-CM | POA: Diagnosis present

## 2023-07-09 DIAGNOSIS — R4182 Altered mental status, unspecified: Principal | ICD-10-CM

## 2023-07-09 DIAGNOSIS — Z743 Need for continuous supervision: Secondary | ICD-10-CM | POA: Diagnosis not present

## 2023-07-09 DIAGNOSIS — M898X9 Other specified disorders of bone, unspecified site: Secondary | ICD-10-CM | POA: Diagnosis present

## 2023-07-09 DIAGNOSIS — R109 Unspecified abdominal pain: Secondary | ICD-10-CM | POA: Diagnosis not present

## 2023-07-09 DIAGNOSIS — Z89512 Acquired absence of left leg below knee: Secondary | ICD-10-CM

## 2023-07-09 DIAGNOSIS — R29810 Facial weakness: Secondary | ICD-10-CM | POA: Diagnosis not present

## 2023-07-09 DIAGNOSIS — I63311 Cerebral infarction due to thrombosis of right middle cerebral artery: Secondary | ICD-10-CM | POA: Diagnosis not present

## 2023-07-09 DIAGNOSIS — I69354 Hemiplegia and hemiparesis following cerebral infarction affecting left non-dominant side: Secondary | ICD-10-CM

## 2023-07-09 DIAGNOSIS — Z89421 Acquired absence of other right toe(s): Secondary | ICD-10-CM

## 2023-07-09 DIAGNOSIS — I2510 Atherosclerotic heart disease of native coronary artery without angina pectoris: Secondary | ICD-10-CM

## 2023-07-09 DIAGNOSIS — K760 Fatty (change of) liver, not elsewhere classified: Secondary | ICD-10-CM | POA: Diagnosis present

## 2023-07-09 DIAGNOSIS — Z992 Dependence on renal dialysis: Secondary | ICD-10-CM

## 2023-07-09 DIAGNOSIS — T17990A Other foreign object in respiratory tract, part unspecified in causing asphyxiation, initial encounter: Secondary | ICD-10-CM | POA: Diagnosis not present

## 2023-07-09 DIAGNOSIS — Z794 Long term (current) use of insulin: Secondary | ICD-10-CM

## 2023-07-09 DIAGNOSIS — K820 Obstruction of gallbladder: Secondary | ICD-10-CM | POA: Diagnosis not present

## 2023-07-09 DIAGNOSIS — E43 Unspecified severe protein-calorie malnutrition: Secondary | ICD-10-CM | POA: Diagnosis present

## 2023-07-09 DIAGNOSIS — R5381 Other malaise: Secondary | ICD-10-CM | POA: Insufficient documentation

## 2023-07-09 DIAGNOSIS — E1143 Type 2 diabetes mellitus with diabetic autonomic (poly)neuropathy: Secondary | ICD-10-CM | POA: Diagnosis present

## 2023-07-09 DIAGNOSIS — A419 Sepsis, unspecified organism: Secondary | ICD-10-CM | POA: Diagnosis present

## 2023-07-09 DIAGNOSIS — Z515 Encounter for palliative care: Secondary | ICD-10-CM

## 2023-07-09 DIAGNOSIS — A4102 Sepsis due to Methicillin resistant Staphylococcus aureus: Principal | ICD-10-CM | POA: Diagnosis present

## 2023-07-09 DIAGNOSIS — J690 Pneumonitis due to inhalation of food and vomit: Secondary | ICD-10-CM

## 2023-07-09 DIAGNOSIS — R6521 Severe sepsis with septic shock: Secondary | ICD-10-CM | POA: Diagnosis present

## 2023-07-09 DIAGNOSIS — R0689 Other abnormalities of breathing: Secondary | ICD-10-CM | POA: Diagnosis not present

## 2023-07-09 DIAGNOSIS — R160 Hepatomegaly, not elsewhere classified: Secondary | ICD-10-CM | POA: Diagnosis present

## 2023-07-09 DIAGNOSIS — R0602 Shortness of breath: Secondary | ICD-10-CM | POA: Diagnosis not present

## 2023-07-09 DIAGNOSIS — R4701 Aphasia: Secondary | ICD-10-CM | POA: Diagnosis not present

## 2023-07-09 DIAGNOSIS — J9601 Acute respiratory failure with hypoxia: Secondary | ICD-10-CM

## 2023-07-09 DIAGNOSIS — T827XXA Infection and inflammatory reaction due to other cardiac and vascular devices, implants and grafts, initial encounter: Secondary | ICD-10-CM | POA: Diagnosis present

## 2023-07-09 DIAGNOSIS — E871 Hypo-osmolality and hyponatremia: Secondary | ICD-10-CM | POA: Diagnosis present

## 2023-07-09 DIAGNOSIS — R570 Cardiogenic shock: Secondary | ICD-10-CM | POA: Diagnosis not present

## 2023-07-09 DIAGNOSIS — Z89412 Acquired absence of left great toe: Secondary | ICD-10-CM

## 2023-07-09 DIAGNOSIS — N179 Acute kidney failure, unspecified: Secondary | ICD-10-CM | POA: Diagnosis not present

## 2023-07-09 DIAGNOSIS — I82B13 Acute embolism and thrombosis of subclavian vein, bilateral: Secondary | ICD-10-CM | POA: Insufficient documentation

## 2023-07-09 DIAGNOSIS — E1043 Type 1 diabetes mellitus with diabetic autonomic (poly)neuropathy: Secondary | ICD-10-CM | POA: Diagnosis present

## 2023-07-09 DIAGNOSIS — Z89612 Acquired absence of left leg above knee: Secondary | ICD-10-CM

## 2023-07-09 DIAGNOSIS — L98429 Non-pressure chronic ulcer of back with unspecified severity: Secondary | ICD-10-CM | POA: Diagnosis present

## 2023-07-09 DIAGNOSIS — E876 Hypokalemia: Secondary | ICD-10-CM | POA: Diagnosis not present

## 2023-07-09 DIAGNOSIS — Z452 Encounter for adjustment and management of vascular access device: Secondary | ICD-10-CM | POA: Diagnosis not present

## 2023-07-09 DIAGNOSIS — I959 Hypotension, unspecified: Secondary | ICD-10-CM | POA: Diagnosis not present

## 2023-07-09 DIAGNOSIS — D631 Anemia in chronic kidney disease: Secondary | ICD-10-CM | POA: Diagnosis present

## 2023-07-09 DIAGNOSIS — D696 Thrombocytopenia, unspecified: Secondary | ICD-10-CM | POA: Diagnosis present

## 2023-07-09 DIAGNOSIS — I82C12 Acute embolism and thrombosis of left internal jugular vein: Secondary | ICD-10-CM | POA: Diagnosis not present

## 2023-07-09 DIAGNOSIS — Z833 Family history of diabetes mellitus: Secondary | ICD-10-CM

## 2023-07-09 DIAGNOSIS — E10649 Type 1 diabetes mellitus with hypoglycemia without coma: Secondary | ICD-10-CM | POA: Diagnosis not present

## 2023-07-09 DIAGNOSIS — E872 Acidosis, unspecified: Secondary | ICD-10-CM | POA: Diagnosis present

## 2023-07-09 DIAGNOSIS — Z8616 Personal history of COVID-19: Secondary | ICD-10-CM

## 2023-07-09 DIAGNOSIS — E1010 Type 1 diabetes mellitus with ketoacidosis without coma: Secondary | ICD-10-CM | POA: Diagnosis present

## 2023-07-09 DIAGNOSIS — N2581 Secondary hyperparathyroidism of renal origin: Secondary | ICD-10-CM | POA: Diagnosis present

## 2023-07-09 HISTORY — DX: End stage renal disease: N18.6

## 2023-07-09 LAB — CBC WITH DIFFERENTIAL/PLATELET
Abs Immature Granulocytes: 0.77 10*3/uL — ABNORMAL HIGH (ref 0.00–0.07)
Basophils Absolute: 0.1 10*3/uL (ref 0.0–0.1)
Basophils Relative: 0 %
Eosinophils Absolute: 0.1 10*3/uL (ref 0.0–0.5)
Eosinophils Relative: 1 %
HCT: 33.3 % — ABNORMAL LOW (ref 39.0–52.0)
Hemoglobin: 10.2 g/dL — ABNORMAL LOW (ref 13.0–17.0)
Immature Granulocytes: 4 %
Lymphocytes Relative: 1 %
Lymphs Abs: 0.3 10*3/uL — ABNORMAL LOW (ref 0.7–4.0)
MCH: 28.2 pg (ref 26.0–34.0)
MCHC: 30.6 g/dL (ref 30.0–36.0)
MCV: 92 fL (ref 80.0–100.0)
Monocytes Absolute: 0.9 10*3/uL (ref 0.1–1.0)
Monocytes Relative: 4 %
Neutro Abs: 18.6 10*3/uL — ABNORMAL HIGH (ref 1.7–7.7)
Neutrophils Relative %: 90 %
Platelets: 155 10*3/uL (ref 150–400)
RBC: 3.62 MIL/uL — ABNORMAL LOW (ref 4.22–5.81)
RDW: 14.1 % (ref 11.5–15.5)
WBC: 20.7 10*3/uL — ABNORMAL HIGH (ref 4.0–10.5)
nRBC: 0 % (ref 0.0–0.2)

## 2023-07-09 LAB — COMPREHENSIVE METABOLIC PANEL
ALT: 13 U/L (ref 0–44)
AST: 33 U/L (ref 15–41)
Albumin: 2.8 g/dL — ABNORMAL LOW (ref 3.5–5.0)
Alkaline Phosphatase: 178 U/L — ABNORMAL HIGH (ref 38–126)
BUN: 64 mg/dL — ABNORMAL HIGH (ref 6–20)
CO2: <7 mmol/L — ABNORMAL LOW (ref 22–32)
Calcium: 9.5 mg/dL (ref 8.9–10.3)
Chloride: 85 mmol/L — ABNORMAL LOW (ref 98–111)
Creatinine, Ser: 16.48 mg/dL — ABNORMAL HIGH (ref 0.61–1.24)
GFR, Estimated: 3 mL/min — ABNORMAL LOW (ref >60)
Glucose, Bld: 497 mg/dL — ABNORMAL HIGH (ref 70–99)
Potassium: 5 mmol/L (ref 3.5–5.1)
Sodium: 125 mmol/L — ABNORMAL LOW (ref 135–145)
Total Bilirubin: 1.9 mg/dL — ABNORMAL HIGH (ref 0.3–1.2)
Total Protein: 7 g/dL (ref 6.5–8.1)

## 2023-07-09 LAB — I-STAT CG4 LACTIC ACID, ED: Lactic Acid, Venous: 2.4 mmol/L (ref 0.5–1.9)

## 2023-07-09 LAB — MAGNESIUM: Magnesium: 2.4 mg/dL (ref 1.7–2.4)

## 2023-07-09 LAB — AMMONIA: Ammonia: 16 umol/L (ref 9–35)

## 2023-07-09 LAB — TSH: TSH: 2.851 u[IU]/mL (ref 0.350–4.500)

## 2023-07-09 MED ORDER — VANCOMYCIN HCL 1750 MG/350ML IV SOLN
1750.0000 mg | Freq: Once | INTRAVENOUS | Status: AC
  Administered 2023-07-10: 1750 mg via INTRAVENOUS
  Filled 2023-07-09: qty 350

## 2023-07-09 MED ORDER — SODIUM CHLORIDE 0.9 % IV BOLUS
1000.0000 mL | Freq: Once | INTRAVENOUS | Status: AC
  Administered 2023-07-09: 1000 mL via INTRAVENOUS

## 2023-07-09 MED ORDER — PIPERACILLIN-TAZOBACTAM 3.375 G IVPB 30 MIN
3.3750 g | Freq: Once | INTRAVENOUS | Status: AC
  Administered 2023-07-10: 3.375 g via INTRAVENOUS
  Filled 2023-07-09: qty 50

## 2023-07-09 MED ORDER — HYDROMORPHONE HCL 1 MG/ML IJ SOLN
0.5000 mg | Freq: Once | INTRAMUSCULAR | Status: AC
  Administered 2023-07-09: 0.5 mg via INTRAVENOUS
  Filled 2023-07-09: qty 1

## 2023-07-09 NOTE — ED Notes (Signed)
Pt to CT

## 2023-07-09 NOTE — ED Provider Notes (Signed)
[place] EMERGENCY DEPARTMENT AT [facility] Provider Note  CSN: [id number] Arrival date & time: 07/09/23 2131  Chief Complaint(s) Altered Mental Status  HPI [name] is a 42 y.o. male history of diabetes, dialysis dependent, gastroparesis presenting to the emergency department with altered mental status.  Patient coming from home.  Was found by family member on the ground.  Family unsure if he went to dialysis today as he typically drives himself.  Paramedics noted that dialysis catheter had fallen on the ground.  Patient complains of abdominal pain and request water.  History limited due to confusion.   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, with necrosis of bone ([name])    Dialysis patient [facility])    Hypertension    Renal disorder    Dialysis   Sepsis [facility])    Patient Active Problem List   Diagnosis Date Noted   Diabetic foot infection ([name]) 04/09/2023   Elevated LFTs 04/09/2023   Thrombocytopenia ([name]) 04/09/2023   DKA (diabetic ketoacidosis) ([name]) 08/09/2021   Cellulitis of right leg 05/19/2021   Pressure injury of skin 10/15/2020   Adjustment disorder, unspecified 09/21/2020   Malnutrition of moderate degree 09/13/2020   Charcot's joint of foot, left    Pneumonia due to COVID-19 virus 02/14/2020   Allergy, unspecified, initial encounter 07/17/2019   Diarrhea 04/24/2019   Encounter for orthopedic aftercare following surgical amputation    DM (diabetes mellitus), secondary, uncontrolled, with neurologic complications    Headache 09/23/2018   Anemia of chronic disease 08/18/2018   Unspecified protein-calorie malnutrition ([name]) 02/14/2018   Insulin dependent type 1 diabetes mellitus ([name]) 10/25/2017    Diabetic gastroparesis ([name]) 10/25/2017   Iron deficiency anemia, unspecified 10/28/2016   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 Authorizing Provider  calcitRIOL (ROCALTROL) 0.5 MCG capsule Take 3 capsules (1.5 mcg total) by mouth every Monday, Wednesday, and Friday with hemodialysis. 05/23/21   Kathlen Mody, MD  cinacalcet (SENSIPAR) 60 MG tablet Take 120 mg by mouth every evening. 04/10/22   [provider]  gabapentin (NEURONTIN) 100 MG capsule TAKE 1 CAPSULE BY MOUTH THREE TIMES DAILY 07/05/23   [name], NP  glucose blood (ONETOUCH VERIO) test strip USE 1 STRIP TO CHECK GLUCOSE THREE TIMES DAILY AS DIRECTED 05/12/23   [name], NP  insulin NPH-regular Human (NOVOLIN 70/30) (70-30) 100 UNIT/ML injection Inject 10 Units into the skin 2 (two) times daily with a meal. 12/18/22   [name], NP  multivitamin (RENA-VIT) TABS tablet Take 1 tablet by mouth at bedtime. 05/23/21   Kathlen Mody, MD  NOVOLIN R 100 UNIT/ML injection INJECT 12 UNITS SUBCUTANEOUSLY THREE TIMES DAILY BEFORE MEAL(S) 07/05/23   [name], NP  sevelamer carbonate (RENVELA) 800 MG tablet Take 2,400-4,000 mg by mouth See admin instructions. Take 4,000 mg (5 tablets) by mouth with meals and 2,400 mg (3 tablets) with snacks 04/27/19   [provider]  Past Surgical History Past Surgical History:  Procedure Laterality Date   AMPUTATION Right 02/02/2018   Procedure: RIGHT FIFTH TOE AND METATARSAL AMPUTATION. Filetted toe flap metatarsal resection. Debridement Plantar Foot wound;   Surgeon: Park Liter, DPM;  Location: [facility] OR;  Service: Podiatry;  Laterality: Right;   AMPUTATION Left 08/20/2018   Procedure: FIFTH METATARSAL BONE BIOPSY;  Surgeon: Park Liter, DPM;  Location: [facility] OR;  Service: Podiatry;  Laterality: Left;   AMPUTATION Left 10/28/2018   Procedure: LEFT GREAT TOE AMPUTATION;  Surgeon: Park Liter, DPM;  Location: [facility] OR;  Service: Podiatry;  Laterality: Left;   AMPUTATION Left 09/16/2020   Procedure: AMPUTATION BELOW KNEE;  Surgeon: Larina Earthly, MD;  Location: [facility] OR;  Service: Vascular;  Laterality: Left;   AMPUTATION Left 10/15/2020   Procedure: AMPUTATION ABOVE KNEE;  Surgeon: Sherren Kerns, MD;  Location: [facility] OR;  Service: Vascular;  Laterality: Left;   APPLICATION OF WOUND VAC  02/02/2018   Procedure: APPLICATION OF WOUND VAC  Right Foot;  Surgeon: Park Liter, DPM;  Location: [facility] OR;  Service: Podiatry;;   APPLICATION OF WOUND VAC Left 10/28/2018   Procedure: APPLICATION OF WOUND VAC LEFT TOE;  Surgeon: Park Liter, DPM;  Location: [facility] OR;  Service: Podiatry;  Laterality: Left;   APPLICATION OF WOUND VAC Left 11/01/2018   Procedure: APPLICATION OF WOUND VAC;  Surgeon: Park Liter, DPM;  Location: [facility] OR;  Service: Podiatry;  Laterality: Left;   AV FISTULA PLACEMENT     left arm.   AV FISTULA PLACEMENT Right 12/22/2016   Procedure: INSERTION OF ARTERIOVENOUS (AV) GORE-TEX GRAFT ARM;  Surgeon: Sherren Kerns, MD;  Location: [facility] OR;  Service: Vascular;  Laterality: Right;   AV FISTULA PLACEMENT Left 05/26/2018   Procedure: INSERTION OF  ARTERIOVENOUS (AV) GORE-TEX GRAFT LEFT ARM;  Surgeon: Nada Libman, MD;  Location: [facility] OR;  Service: Vascular;  Laterality: Left;   EYE SURGERY     I & D EXTREMITY Right 10/31/2017   Procedure: IRRIGATION AND DEBRIDEMENT RIGHT FOOT;  Surgeon: Park Liter, DPM;  Location: [facility] OR;  Service: Podiatry;  Laterality: Right;   I & D EXTREMITY Left 08/20/2018   Procedure: IRRIGATION AND DEBRIDEMENT  EXTREMITY WITH SECONDARY WOUND CLOSUREAND APPLICATION OF WOUND VAC LEFT FOOT;  Surgeon: Park Liter, DPM;  Location: [facility] OR;  Service: Podiatry;  Laterality: Left;   I & D EXTREMITY Left 10/20/2018   Procedure: IRRIGATION AND DEBRIDEMENT LEFT FOOT  DEBRIDEMENT LATERAL FOOT WOUND;  Surgeon: Park Liter, DPM;  Location: [facility] OR;  Service: Podiatry;  Laterality: Left;   I & D EXTREMITY Left 10/28/2018   Procedure: IRRIGATION AND DEBRIDEMENT LEFT TOE;  Surgeon: Park Liter, DPM;  Location: [facility] OR;  Service: Podiatry;  Laterality: Left;   I & D EXTREMITY Left 09/14/2020   Procedure: IRRIGATION AND DEBRIDEMENT WRIST;  Surgeon: Betha Loa, MD;  Location: [facility] OR;  Service: Orthopedics;  Laterality: Left;   INSERTION OF DIALYSIS CATHETER     Right subclavian   IR AV DIALY SHUNT INTRO NEEDLE/INTRACATH INITIAL W/PTA/IMG RIGHT Right 02/05/2018   IR FLUORO GUIDE CV LINE LEFT  04/14/2023   IR FLUORO GUIDE CV LINE RIGHT  01/31/2020   IR FLUORO GUIDE CV LINE RIGHT  01/30/2022   IR PTA VENOUS EXCEPT DIALYSIS CIRCUIT  01/30/2022   IR REMOVAL TUN CV CATH W/O FL  04/12/2023   IR THROMBECTOMY AV FISTULA W/THROMBOLYSIS/PTA INC/SHUNT/IMG LEFT Left 08/24/2018  IR THROMBECTOMY AV FISTULA W/THROMBOLYSIS/PTA INC/SHUNT/IMG LEFT Left 01/06/2019   IR US GUIDE VASC ACCESS LEFT  08/24/2018   IR US GUIDE VASC ACCESS LEFT  04/14/2023   IR US GUIDE VASC ACCESS RIGHT  02/05/2018   IR US GUIDE VASC ACCESS RIGHT  01/31/2020   IR VENOCAVAGRAM SVC  01/30/2022   IRRIGATION AND DEBRIDEMENT FOOT Right 10/23/2018   Procedure: Irrigation and Debridement to tendon, Left Foot;  Surgeon: Park Liter, DPM;  Location: [facility] OR;  Service: Podiatry;  Laterality: Right;   IRRIGATION AND DEBRIDEMENT FOOT Left 11/01/2018   Procedure: IRRIGATION AND DEBRIDEMENT PARTIAL WOUND CLOSURE LOCAL TISSUE TRANSFER AND FLAP ROTATION, LEFT FOOT;  Surgeon: Park Liter, DPM;  Location: [facility] OR;  Service: Podiatry;  Laterality: Left;   TRANSMETATARSAL AMPUTATION  N/A 08/18/2018   Procedure: IRRIGATION AND DEBRIDEMENT OF LEFT 5TH TOE AND TRANSMETATARSAL, WITH PARTICAL LEFT 5TH TOE AND METATARSAL AMPUTATION, BONE BIOPSY, WOUND VAC APPLICATION.;  Surgeon: Park Liter, DPM;  Location: [facility] OR;  Service: Podiatry;  Laterality: N/A;   UPPER EXTREMITY VENOGRAPHY N/A 11/16/2016   Procedure: Upper Extremity Venography - Right Central;  Surgeon: Sherren Kerns, MD;  Location: [facility] INVASIVE CV LAB;  Service: Cardiovascular;  Laterality: N/A;   UPPER EXTREMITY VENOGRAPHY N/A 05/25/2018   Procedure: UPPER EXTREMITY VENOGRAPHY - Bilateral;  Surgeon: Cephus Shelling, MD;  Location: [facility] INVASIVE CV LAB;  Service: Cardiovascular;  Laterality: N/A;   Family History Family History  Problem Relation Age of Onset   Diabetes Mellitus II Other    Diabetes Father    Renal Disease Father        ESRD    Social History Social History   Tobacco Use   Smoking status: Never   Smokeless tobacco: Never  Vaping Use   Vaping status: Never Used  Substance Use Topics   Alcohol use: No   Drug use: No   Allergies Patient has no known allergies.  Review of Systems Review of Systems  All other systems reviewed and are negative.   Physical Exam Vital Signs  I have reviewed the triage vital signs BP (!) 112/57   Pulse 93   Temp 98.8 F (37.1 C) (Rectal)   Resp 15   Wt 77.6 kg   SpO2 95%   BMI 26.79 kg/m  Physical Exam Vitals and nursing note reviewed.  Constitutional:      General: He is in acute distress.     Appearance: Normal appearance. He is ill-appearing.  HENT:     Mouth/Throat:     Mouth: Mucous membranes are moist.  Eyes:     Conjunctiva/sclera: Conjunctivae normal.  Cardiovascular:     Rate and Rhythm: Regular rhythm. Tachycardia present.  Pulmonary:     Effort: Pulmonary effort is normal. No respiratory distress.     Breath sounds: Normal breath sounds.  Abdominal:     General: Abdomen is flat.     Palpations: Abdomen is soft.      Tenderness: There is abdominal tenderness (mild diffuse).  Musculoskeletal:     Right lower leg: No edema.     Comments: Left BKA  Skin:    General: Skin is warm and dry.     Capillary Refill: Capillary refill takes less than 2 seconds.  Neurological:     Mental Status: He is alert.     Comments: Oriented to self, moves all 4 extremities equally, follows commands  Psychiatric:        Mood and Affect: Mood  normal.        Behavior: Behavior normal.     ED Results and Treatments Labs (all labs ordered are listed, but only abnormal results are displayed) Labs Reviewed  CBC WITH DIFFERENTIAL/PLATELET - Abnormal; Notable for the following components:      Result Value   WBC 20.7 (*)    RBC 3.62 (*)    Hemoglobin 10.2 (*)    HCT 33.3 (*)    Neutro Abs 18.6 (*)    Lymphs Abs 0.3 (*)    Abs Immature Granulocytes 0.77 (*)    All other components within normal limits  I-STAT CG4 LACTIC ACID, ED - Abnormal; Notable for the following components:   Lactic Acid, Venous 2.4 (*)    All other components within normal limits  CULTURE, BLOOD (ROUTINE X 2)  CULTURE, BLOOD (ROUTINE X 2)  SARS CORONAVIRUS 2 BY RT PCR  AMMONIA  COMPREHENSIVE METABOLIC PANEL  TSH  MAGNESIUM  RAPID URINE DRUG SCREEN, HOSP PERFORMED  URINALYSIS, W/ REFLEX TO CULTURE (INFECTION SUSPECTED)  I-STAT CHEM 8, ED  CBG MONITORING, ED                                                                                                                          Radiology No results found.  Pertinent labs & imaging results that were available during my care of the patient were reviewed by me and considered in my medical decision making (see MDM for details).  Medications Ordered in ED Medications  vancomycin (VANCOREADY) IVPB 1750 mg/350 mL (has no administration in time range)  piperacillin-tazobactam (ZOSYN) IVPB 3.375 g (has no administration in time range)  sodium chloride 0.9 % bolus 1,000 mL (1,000 mLs Intravenous  New Bag/Given 07/09/23 2232)  HYDROmorphone (DILAUDID) injection 0.5 mg (0.5 mg Intravenous Given 07/09/23 2313)                                                                                                                                     Procedures .Critical Care  Performed by: Lonell Grandchild, MD Authorized by: Lonell Grandchild, MD   Critical care provider statement:    Critical care time (minutes):  30   Critical care was necessary to treat or prevent imminent or life-threatening deterioration of the following conditions:  Sepsis   Critical care was time spent personally by me on the following activities:  Development  of treatment plan with patient or surrogate, discussions with consultants, evaluation of patient's response to treatment, examination of patient, ordering and review of laboratory studies, ordering and review of radiographic studies, ordering and performing treatments and interventions, pulse oximetry, re-evaluation of patient's condition and review of old charts   (including critical care time)  Medical Decision Making / ED Course   MDM:  42 year old presenting to the emergency department with altered mental status.  Patient mildly ill-appearing, oriented to self.  Appears very dehydrated.  Reviewing chart, patient had been in communication with his primary doctor about having COVID.  Differential includes underlying infection, he does have some abdominal tenderness so we will obtain CT abdomen.  Also obtain chest x-ray to evaluate for pneumonia.  Could also be related to apparent recent COVID diagnosis.  Will obtain COVID swab to confirm this.  Differentials includes toxic or metabolic cause such as diabetic ketoacidosis, uremia, hyperammonemia, electrolyte disturbance so we will check labs.  Less likely intracranial process but patient did apparently fall in an end up on the ground so we will check CT head.  Patient also with dislodged dialysis catheter,  will evaluate for need for dialysis.  Will need replacement.   Signed out to Dr. Madilyn Hook pending remainder of workup.  Labs notable for leukocytosis so have added on blood cultures as well as lactate.  Will receive broad-spectrum antibiotics.     Additional history obtained: -Additional history obtained from ems   Lab Tests: -I ordered, reviewed, and interpreted labs.   The pertinent results include:   Labs Reviewed  CBC WITH DIFFERENTIAL/PLATELET - Abnormal; Notable for the following components:      Result Value   WBC 20.7 (*)    RBC 3.62 (*)    Hemoglobin 10.2 (*)    HCT 33.3 (*)    Neutro Abs 18.6 (*)    Lymphs Abs 0.3 (*)    Abs Immature Granulocytes 0.77 (*)    All other components within normal limits  I-STAT CG4 LACTIC ACID, ED - Abnormal; Notable for the following components:   Lactic Acid, Venous 2.4 (*)    All other components within normal limits  CULTURE, BLOOD (ROUTINE X 2)  CULTURE, BLOOD (ROUTINE X 2)  SARS CORONAVIRUS 2 BY RT PCR  AMMONIA  COMPREHENSIVE METABOLIC PANEL  TSH  MAGNESIUM  RAPID URINE DRUG SCREEN, HOSP PERFORMED  URINALYSIS, W/ REFLEX TO CULTURE (INFECTION SUSPECTED)  I-STAT CHEM 8, ED  CBG MONITORING, ED    Notable for leukocytosis   Medicines ordered and prescription drug management: Meds ordered this encounter  Medications   sodium chloride 0.9 % bolus 1,000 mL   HYDROmorphone (DILAUDID) injection 0.5 mg   vancomycin (VANCOREADY) IVPB 1750 mg/350 mL    Order Specific Question:   Indication:    Answer:   Sepsis   piperacillin-tazobactam (ZOSYN) IVPB 3.375 g    Order Specific Question:   Antibiotic Indication:    Answer:   Sepsis    -I have reviewed the patients home medicines and have made adjustments as needed  Co morbidities that complicate the patient evaluation  Past Medical History:  Diagnosis Date   Anemia    Cellulitis and abscess of toe of left foot    Diabetes mellitus without complication ([name])    Diabetic  gastroparesis ([name])    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    Final Clinical Impression(s) / ED Diagnoses Final diagnoses:  Altered mental status, unspecified altered mental status type     This chart was dictated using voice recognition software.  Despite best efforts to proofread,  errors can occur which can change the documentation meaning.    Lonell Grandchild, MD 07/09/23 [phone]

## 2023-07-09 NOTE — ED Notes (Signed)
EMS reports dialysis catheter partially removed pta.

## 2023-07-09 NOTE — ED Triage Notes (Signed)
Pt BIB EMS from home. Sister found pt in floor with AMS. Last seen on Wednesday per family. EMS reports scene appeared like pt may have fallen oob. Pt only c/o of being cold, oriented to person, usually A&Ox4. Diarrhea x1wk per family.  Dialysis on  MWF, unsure if pt went today, unlikely since pt usually drives self per ems.   EMS VS: cbg 461, HR 96, 129/55, 98% RA

## 2023-07-10 ENCOUNTER — Inpatient Hospital Stay (HOSPITAL_COMMUNITY): Payer: Medicare HMO

## 2023-07-10 DIAGNOSIS — I517 Cardiomegaly: Secondary | ICD-10-CM | POA: Diagnosis not present

## 2023-07-10 DIAGNOSIS — K760 Fatty (change of) liver, not elsewhere classified: Secondary | ICD-10-CM | POA: Diagnosis not present

## 2023-07-10 DIAGNOSIS — R569 Unspecified convulsions: Secondary | ICD-10-CM | POA: Diagnosis not present

## 2023-07-10 DIAGNOSIS — Z789 Other specified health status: Secondary | ICD-10-CM | POA: Diagnosis not present

## 2023-07-10 DIAGNOSIS — A4102 Sepsis due to Methicillin resistant Staphylococcus aureus: Secondary | ICD-10-CM | POA: Diagnosis not present

## 2023-07-10 DIAGNOSIS — A419 Sepsis, unspecified organism: Secondary | ICD-10-CM | POA: Diagnosis not present

## 2023-07-10 DIAGNOSIS — E1010 Type 1 diabetes mellitus with ketoacidosis without coma: Secondary | ICD-10-CM | POA: Diagnosis not present

## 2023-07-10 DIAGNOSIS — I5021 Acute systolic (congestive) heart failure: Secondary | ICD-10-CM | POA: Diagnosis not present

## 2023-07-10 DIAGNOSIS — I340 Nonrheumatic mitral (valve) insufficiency: Secondary | ICD-10-CM | POA: Diagnosis not present

## 2023-07-10 DIAGNOSIS — N186 End stage renal disease: Secondary | ICD-10-CM | POA: Diagnosis not present

## 2023-07-10 DIAGNOSIS — N250 Renal osteodystrophy: Secondary | ICD-10-CM | POA: Diagnosis not present

## 2023-07-10 DIAGNOSIS — D638 Anemia in other chronic diseases classified elsewhere: Secondary | ICD-10-CM | POA: Diagnosis not present

## 2023-07-10 DIAGNOSIS — I63311 Cerebral infarction due to thrombosis of right middle cerebral artery: Secondary | ICD-10-CM | POA: Diagnosis not present

## 2023-07-10 DIAGNOSIS — J168 Pneumonia due to other specified infectious organisms: Secondary | ICD-10-CM | POA: Diagnosis not present

## 2023-07-10 DIAGNOSIS — R509 Fever, unspecified: Secondary | ICD-10-CM | POA: Diagnosis not present

## 2023-07-10 DIAGNOSIS — R0603 Acute respiratory distress: Secondary | ICD-10-CM | POA: Diagnosis not present

## 2023-07-10 DIAGNOSIS — M79661 Pain in right lower leg: Secondary | ICD-10-CM | POA: Diagnosis not present

## 2023-07-10 DIAGNOSIS — I639 Cerebral infarction, unspecified: Secondary | ICD-10-CM | POA: Diagnosis not present

## 2023-07-10 DIAGNOSIS — E43 Unspecified severe protein-calorie malnutrition: Secondary | ICD-10-CM | POA: Diagnosis not present

## 2023-07-10 DIAGNOSIS — I120 Hypertensive chronic kidney disease with stage 5 chronic kidney disease or end stage renal disease: Secondary | ICD-10-CM | POA: Diagnosis not present

## 2023-07-10 DIAGNOSIS — I76 Septic arterial embolism: Secondary | ICD-10-CM | POA: Diagnosis not present

## 2023-07-10 DIAGNOSIS — I132 Hypertensive heart and chronic kidney disease with heart failure and with stage 5 chronic kidney disease, or end stage renal disease: Secondary | ICD-10-CM | POA: Diagnosis not present

## 2023-07-10 DIAGNOSIS — I614 Nontraumatic intracerebral hemorrhage in cerebellum: Secondary | ICD-10-CM | POA: Diagnosis not present

## 2023-07-10 DIAGNOSIS — T827XXD Infection and inflammatory reaction due to other cardiac and vascular devices, implants and grafts, subsequent encounter: Secondary | ICD-10-CM | POA: Diagnosis not present

## 2023-07-10 DIAGNOSIS — J90 Pleural effusion, not elsewhere classified: Secondary | ICD-10-CM | POA: Diagnosis not present

## 2023-07-10 DIAGNOSIS — Y92239 Unspecified place in hospital as the place of occurrence of the external cause: Secondary | ICD-10-CM | POA: Diagnosis not present

## 2023-07-10 DIAGNOSIS — Z434 Encounter for attention to other artificial openings of digestive tract: Secondary | ICD-10-CM | POA: Diagnosis not present

## 2023-07-10 DIAGNOSIS — Z992 Dependence on renal dialysis: Secondary | ICD-10-CM

## 2023-07-10 DIAGNOSIS — Z89612 Acquired absence of left leg above knee: Secondary | ICD-10-CM | POA: Diagnosis not present

## 2023-07-10 DIAGNOSIS — Z711 Person with feared health complaint in whom no diagnosis is made: Secondary | ICD-10-CM | POA: Diagnosis not present

## 2023-07-10 DIAGNOSIS — E1110 Type 2 diabetes mellitus with ketoacidosis without coma: Secondary | ICD-10-CM | POA: Diagnosis not present

## 2023-07-10 DIAGNOSIS — G936 Cerebral edema: Secondary | ICD-10-CM | POA: Diagnosis not present

## 2023-07-10 DIAGNOSIS — I38 Endocarditis, valve unspecified: Secondary | ICD-10-CM | POA: Diagnosis not present

## 2023-07-10 DIAGNOSIS — G060 Intracranial abscess and granuloma: Secondary | ICD-10-CM | POA: Diagnosis not present

## 2023-07-10 DIAGNOSIS — Z452 Encounter for adjustment and management of vascular access device: Secondary | ICD-10-CM | POA: Diagnosis not present

## 2023-07-10 DIAGNOSIS — R6521 Severe sepsis with septic shock: Secondary | ICD-10-CM | POA: Diagnosis not present

## 2023-07-10 DIAGNOSIS — Z8616 Personal history of COVID-19: Secondary | ICD-10-CM | POA: Diagnosis not present

## 2023-07-10 DIAGNOSIS — Y848 Other medical procedures as the cause of abnormal reaction of the patient, or of later complication, without mention of misadventure at the time of the procedure: Secondary | ICD-10-CM | POA: Diagnosis not present

## 2023-07-10 DIAGNOSIS — I69391 Dysphagia following cerebral infarction: Secondary | ICD-10-CM | POA: Diagnosis not present

## 2023-07-10 DIAGNOSIS — G9341 Metabolic encephalopathy: Secondary | ICD-10-CM | POA: Diagnosis not present

## 2023-07-10 DIAGNOSIS — J189 Pneumonia, unspecified organism: Secondary | ICD-10-CM | POA: Diagnosis not present

## 2023-07-10 DIAGNOSIS — I059 Rheumatic mitral valve disease, unspecified: Secondary | ICD-10-CM | POA: Diagnosis not present

## 2023-07-10 DIAGNOSIS — G939 Disorder of brain, unspecified: Secondary | ICD-10-CM | POA: Diagnosis not present

## 2023-07-10 DIAGNOSIS — D631 Anemia in chronic kidney disease: Secondary | ICD-10-CM | POA: Diagnosis not present

## 2023-07-10 DIAGNOSIS — E1310 Other specified diabetes mellitus with ketoacidosis without coma: Secondary | ICD-10-CM | POA: Diagnosis not present

## 2023-07-10 DIAGNOSIS — J9601 Acute respiratory failure with hypoxia: Secondary | ICD-10-CM | POA: Diagnosis not present

## 2023-07-10 DIAGNOSIS — R4182 Altered mental status, unspecified: Secondary | ICD-10-CM

## 2023-07-10 DIAGNOSIS — I2690 Septic pulmonary embolism without acute cor pulmonale: Secondary | ICD-10-CM | POA: Diagnosis not present

## 2023-07-10 DIAGNOSIS — I2510 Atherosclerotic heart disease of native coronary artery without angina pectoris: Secondary | ICD-10-CM | POA: Diagnosis not present

## 2023-07-10 DIAGNOSIS — R918 Other nonspecific abnormal finding of lung field: Secondary | ICD-10-CM | POA: Diagnosis not present

## 2023-07-10 DIAGNOSIS — J690 Pneumonitis due to inhalation of food and vomit: Secondary | ICD-10-CM | POA: Diagnosis not present

## 2023-07-10 DIAGNOSIS — I63511 Cerebral infarction due to unspecified occlusion or stenosis of right middle cerebral artery: Secondary | ICD-10-CM | POA: Diagnosis not present

## 2023-07-10 DIAGNOSIS — I5043 Acute on chronic combined systolic (congestive) and diastolic (congestive) heart failure: Secondary | ICD-10-CM | POA: Diagnosis not present

## 2023-07-10 DIAGNOSIS — D62 Acute posthemorrhagic anemia: Secondary | ICD-10-CM | POA: Diagnosis not present

## 2023-07-10 DIAGNOSIS — I6340 Cerebral infarction due to embolism of unspecified cerebral artery: Secondary | ICD-10-CM | POA: Diagnosis not present

## 2023-07-10 DIAGNOSIS — R7881 Bacteremia: Secondary | ICD-10-CM | POA: Diagnosis not present

## 2023-07-10 DIAGNOSIS — B9562 Methicillin resistant Staphylococcus aureus infection as the cause of diseases classified elsewhere: Secondary | ICD-10-CM | POA: Diagnosis not present

## 2023-07-10 DIAGNOSIS — I63411 Cerebral infarction due to embolism of right middle cerebral artery: Secondary | ICD-10-CM | POA: Diagnosis not present

## 2023-07-10 DIAGNOSIS — M7989 Other specified soft tissue disorders: Secondary | ICD-10-CM | POA: Diagnosis not present

## 2023-07-10 DIAGNOSIS — D6959 Other secondary thrombocytopenia: Secondary | ICD-10-CM | POA: Diagnosis not present

## 2023-07-10 DIAGNOSIS — I129 Hypertensive chronic kidney disease with stage 1 through stage 4 chronic kidney disease, or unspecified chronic kidney disease: Secondary | ICD-10-CM | POA: Diagnosis not present

## 2023-07-10 DIAGNOSIS — Z7189 Other specified counseling: Secondary | ICD-10-CM | POA: Diagnosis not present

## 2023-07-10 DIAGNOSIS — I63521 Cerebral infarction due to unspecified occlusion or stenosis of right anterior cerebral artery: Secondary | ICD-10-CM | POA: Diagnosis not present

## 2023-07-10 DIAGNOSIS — Z1152 Encounter for screening for COVID-19: Secondary | ICD-10-CM | POA: Diagnosis not present

## 2023-07-10 DIAGNOSIS — I69354 Hemiplegia and hemiparesis following cerebral infarction affecting left non-dominant side: Secondary | ICD-10-CM | POA: Diagnosis not present

## 2023-07-10 DIAGNOSIS — J984 Other disorders of lung: Secondary | ICD-10-CM | POA: Diagnosis not present

## 2023-07-10 DIAGNOSIS — Z4682 Encounter for fitting and adjustment of non-vascular catheter: Secondary | ICD-10-CM | POA: Diagnosis not present

## 2023-07-10 DIAGNOSIS — R638 Other symptoms and signs concerning food and fluid intake: Secondary | ICD-10-CM | POA: Diagnosis not present

## 2023-07-10 DIAGNOSIS — Z515 Encounter for palliative care: Secondary | ICD-10-CM | POA: Diagnosis not present

## 2023-07-10 DIAGNOSIS — Z89421 Acquired absence of other right toe(s): Secondary | ICD-10-CM | POA: Diagnosis not present

## 2023-07-10 DIAGNOSIS — T80211A Bloodstream infection due to central venous catheter, initial encounter: Secondary | ICD-10-CM | POA: Diagnosis not present

## 2023-07-10 DIAGNOSIS — I339 Acute and subacute endocarditis, unspecified: Secondary | ICD-10-CM | POA: Diagnosis not present

## 2023-07-10 DIAGNOSIS — I330 Acute and subacute infective endocarditis: Secondary | ICD-10-CM | POA: Diagnosis not present

## 2023-07-10 DIAGNOSIS — Z8673 Personal history of transient ischemic attack (TIA), and cerebral infarction without residual deficits: Secondary | ICD-10-CM | POA: Diagnosis not present

## 2023-07-10 DIAGNOSIS — E1043 Type 1 diabetes mellitus with diabetic autonomic (poly)neuropathy: Secondary | ICD-10-CM | POA: Diagnosis not present

## 2023-07-10 DIAGNOSIS — J9600 Acute respiratory failure, unspecified whether with hypoxia or hypercapnia: Secondary | ICD-10-CM | POA: Diagnosis not present

## 2023-07-10 DIAGNOSIS — E1022 Type 1 diabetes mellitus with diabetic chronic kidney disease: Secondary | ICD-10-CM | POA: Diagnosis not present

## 2023-07-10 DIAGNOSIS — R570 Cardiogenic shock: Secondary | ICD-10-CM | POA: Diagnosis not present

## 2023-07-10 DIAGNOSIS — M2548 Effusion, other site: Secondary | ICD-10-CM | POA: Diagnosis not present

## 2023-07-10 DIAGNOSIS — D72829 Elevated white blood cell count, unspecified: Secondary | ICD-10-CM | POA: Diagnosis not present

## 2023-07-10 DIAGNOSIS — I420 Dilated cardiomyopathy: Secondary | ICD-10-CM | POA: Diagnosis not present

## 2023-07-10 DIAGNOSIS — R29818 Other symptoms and signs involving the nervous system: Secondary | ICD-10-CM | POA: Diagnosis not present

## 2023-07-10 LAB — I-STAT VENOUS BLOOD GAS, ED
Acid-base deficit: 24 mmol/L — ABNORMAL HIGH (ref 0.0–2.0)
Bicarbonate: 4.7 mmol/L — ABNORMAL LOW (ref 20.0–28.0)
Calcium, Ion: 1.19 mmol/L (ref 1.15–1.40)
HCT: 37 % — ABNORMAL LOW (ref 39.0–52.0)
Hemoglobin: 12.6 g/dL — ABNORMAL LOW (ref 13.0–17.0)
O2 Saturation: 74 %
Potassium: 4.8 mmol/L (ref 3.5–5.1)
Sodium: 121 mmol/L — ABNORMAL LOW (ref 135–145)
TCO2: 5 mmol/L — ABNORMAL LOW (ref 22–32)
pCO2, Ven: 16.6 mm[Hg] — CL (ref 44–60)
pH, Ven: 7.064 — CL (ref 7.25–7.43)
pO2, Ven: 54 mm[Hg] — ABNORMAL HIGH (ref 32–45)

## 2023-07-10 LAB — GLUCOSE, CAPILLARY
Glucose-Capillary: 478 mg/dL — ABNORMAL HIGH (ref 70–99)
Glucose-Capillary: 400 mg/dL — ABNORMAL HIGH (ref 70–99)
Glucose-Capillary: 418 mg/dL — ABNORMAL HIGH (ref 70–99)
Glucose-Capillary: 363 mg/dL — ABNORMAL HIGH (ref 70–99)
Glucose-Capillary: 335 mg/dL — ABNORMAL HIGH (ref 70–99)
Glucose-Capillary: 268 mg/dL — ABNORMAL HIGH (ref 70–99)
Glucose-Capillary: 236 mg/dL — ABNORMAL HIGH (ref 70–99)
Glucose-Capillary: 188 mg/dL — ABNORMAL HIGH (ref 70–99)
Glucose-Capillary: 130 mg/dL — ABNORMAL HIGH (ref 70–99)
Glucose-Capillary: 100 mg/dL — ABNORMAL HIGH (ref 70–99)
Glucose-Capillary: 104 mg/dL — ABNORMAL HIGH (ref 70–99)
Glucose-Capillary: 100 mg/dL — ABNORMAL HIGH (ref 70–99)
Glucose-Capillary: 104 mg/dL — ABNORMAL HIGH (ref 70–99)
Glucose-Capillary: 111 mg/dL — ABNORMAL HIGH (ref 70–99)
Glucose-Capillary: 124 mg/dL — ABNORMAL HIGH (ref 70–99)
Glucose-Capillary: 120 mg/dL — ABNORMAL HIGH (ref 70–99)
Glucose-Capillary: 107 mg/dL — ABNORMAL HIGH (ref 70–99)
Glucose-Capillary: 117 mg/dL — ABNORMAL HIGH (ref 70–99)
Glucose-Capillary: 133 mg/dL — ABNORMAL HIGH (ref 70–99)

## 2023-07-10 LAB — BASIC METABOLIC PANEL
Anion gap: 27 — ABNORMAL HIGH (ref 5–15)
Anion gap: 22 — ABNORMAL HIGH (ref 5–15)
Anion gap: 17 — ABNORMAL HIGH (ref 5–15)
Anion gap: 20 — ABNORMAL HIGH (ref 5–15)
Anion gap: 18 — ABNORMAL HIGH (ref 5–15)
BUN: 66 mg/dL — ABNORMAL HIGH (ref 6–20)
BUN: 67 mg/dL — ABNORMAL HIGH (ref 6–20)
BUN: 70 mg/dL — ABNORMAL HIGH (ref 6–20)
BUN: 70 mg/dL — ABNORMAL HIGH (ref 6–20)
BUN: 68 mg/dL — ABNORMAL HIGH (ref 6–20)
CO2: 10 mmol/L — ABNORMAL LOW (ref 22–32)
CO2: 13 mmol/L — ABNORMAL LOW (ref 22–32)
CO2: 17 mmol/L — ABNORMAL LOW (ref 22–32)
CO2: 17 mmol/L — ABNORMAL LOW (ref 22–32)
CO2: 15 mmol/L — ABNORMAL LOW (ref 22–32)
Calcium: 9.1 mg/dL (ref 8.9–10.3)
Calcium: 9.1 mg/dL (ref 8.9–10.3)
Calcium: 8.8 mg/dL — ABNORMAL LOW (ref 8.9–10.3)
Calcium: 9.3 mg/dL (ref 8.9–10.3)
Calcium: 8.7 mg/dL — ABNORMAL LOW (ref 8.9–10.3)
Chloride: 91 mmol/L — ABNORMAL LOW (ref 98–111)
Chloride: 93 mmol/L — ABNORMAL LOW (ref 98–111)
Chloride: 95 mmol/L — ABNORMAL LOW (ref 98–111)
Chloride: 93 mmol/L — ABNORMAL LOW (ref 98–111)
Chloride: 96 mmol/L — ABNORMAL LOW (ref 98–111)
Creatinine, Ser: 15.93 mg/dL — ABNORMAL HIGH (ref 0.61–1.24)
Creatinine, Ser: 15.12 mg/dL — ABNORMAL HIGH (ref 0.61–1.24)
Creatinine, Ser: 15.22 mg/dL — ABNORMAL HIGH (ref 0.61–1.24)
Creatinine, Ser: 15.43 mg/dL — ABNORMAL HIGH (ref 0.61–1.24)
Creatinine, Ser: 15.19 mg/dL — ABNORMAL HIGH (ref 0.61–1.24)
GFR, Estimated: 3 mL/min — ABNORMAL LOW (ref >60)
GFR, Estimated: 4 mL/min — ABNORMAL LOW (ref >60)
GFR, Estimated: 4 mL/min — ABNORMAL LOW (ref >60)
GFR, Estimated: 4 mL/min — ABNORMAL LOW (ref >60)
GFR, Estimated: 4 mL/min — ABNORMAL LOW (ref >60)
Glucose, Bld: 383 mg/dL — ABNORMAL HIGH (ref 70–99)
Glucose, Bld: 223 mg/dL — ABNORMAL HIGH (ref 70–99)
Glucose, Bld: 100 mg/dL — ABNORMAL HIGH (ref 70–99)
Glucose, Bld: 123 mg/dL — ABNORMAL HIGH (ref 70–99)
Glucose, Bld: 141 mg/dL — ABNORMAL HIGH (ref 70–99)
Potassium: 4.2 mmol/L (ref 3.5–5.1)
Potassium: 4 mmol/L (ref 3.5–5.1)
Potassium: 4.3 mmol/L (ref 3.5–5.1)
Potassium: 4 mmol/L (ref 3.5–5.1)
Potassium: 4.3 mmol/L (ref 3.5–5.1)
Sodium: 128 mmol/L — ABNORMAL LOW (ref 135–145)
Sodium: 128 mmol/L — ABNORMAL LOW (ref 135–145)
Sodium: 129 mmol/L — ABNORMAL LOW (ref 135–145)
Sodium: 130 mmol/L — ABNORMAL LOW (ref 135–145)
Sodium: 129 mmol/L — ABNORMAL LOW (ref 135–145)

## 2023-07-10 LAB — LACTIC ACID, PLASMA
Lactic Acid, Venous: 1.9 mmol/L (ref 0.5–1.9)
Lactic Acid, Venous: 2.2 mmol/L (ref 0.5–1.9)
Lactic Acid, Venous: 2.3 mmol/L (ref 0.5–1.9)

## 2023-07-10 LAB — COMPREHENSIVE METABOLIC PANEL
ALT: 16 U/L (ref 0–44)
AST: 29 U/L (ref 15–41)
Albumin: 2.6 g/dL — ABNORMAL LOW (ref 3.5–5.0)
Alkaline Phosphatase: 176 U/L — ABNORMAL HIGH (ref 38–126)
BUN: 65 mg/dL — ABNORMAL HIGH (ref 6–20)
CO2: <7 mmol/L — ABNORMAL LOW (ref 22–32)
Calcium: 9 mg/dL (ref 8.9–10.3)
Chloride: 85 mmol/L — ABNORMAL LOW (ref 98–111)
Creatinine, Ser: 15.81 mg/dL — ABNORMAL HIGH (ref 0.61–1.24)
GFR, Estimated: 3 mL/min — ABNORMAL LOW (ref >60)
Glucose, Bld: 542 mg/dL (ref 70–99)
Potassium: 4.8 mmol/L (ref 3.5–5.1)
Sodium: 126 mmol/L — ABNORMAL LOW (ref 135–145)
Total Bilirubin: 2.2 mg/dL — ABNORMAL HIGH (ref 0.3–1.2)
Total Protein: 6.9 g/dL (ref 6.5–8.1)

## 2023-07-10 LAB — I-STAT CHEM 8, ED
BUN: 57 mg/dL — ABNORMAL HIGH (ref 6–20)
Calcium, Ion: 1.2 mmol/L (ref 1.15–1.40)
Chloride: 95 mmol/L — ABNORMAL LOW (ref 98–111)
Creatinine, Ser: 16.5 mg/dL — ABNORMAL HIGH (ref 0.61–1.24)
Glucose, Bld: 525 mg/dL (ref 70–99)
HCT: 38 % — ABNORMAL LOW (ref 39.0–52.0)
Hemoglobin: 12.9 g/dL — ABNORMAL LOW (ref 13.0–17.0)
Potassium: 4.8 mmol/L (ref 3.5–5.1)
Sodium: 122 mmol/L — ABNORMAL LOW (ref 135–145)
TCO2: 7 mmol/L — ABNORMAL LOW (ref 22–32)

## 2023-07-10 LAB — BETA-HYDROXYBUTYRIC ACID
Beta-Hydroxybutyric Acid: >8 mmol/L — ABNORMAL HIGH (ref 0.05–0.27)
Beta-Hydroxybutyric Acid: >8 mmol/L — ABNORMAL HIGH (ref 0.05–0.27)
Beta-Hydroxybutyric Acid: 2.19 mmol/L — ABNORMAL HIGH (ref 0.05–0.27)
Beta-Hydroxybutyric Acid: 0.11 mmol/L (ref 0.05–0.27)

## 2023-07-10 LAB — BLOOD CULTURE ID PANEL (REFLEXED) - BCID2

## 2023-07-10 LAB — C DIFFICILE QUICK SCREEN W PCR REFLEX
C Diff antigen: POSITIVE — AB
C Diff toxin: NEGATIVE

## 2023-07-10 LAB — CBC
HCT: 33.7 % — ABNORMAL LOW (ref 39.0–52.0)
Hemoglobin: 10 g/dL — ABNORMAL LOW (ref 13.0–17.0)
MCH: 28.4 pg (ref 26.0–34.0)
MCHC: 29.7 g/dL — ABNORMAL LOW (ref 30.0–36.0)
MCV: 95.7 fL (ref 80.0–100.0)
Platelets: 153 10*3/uL (ref 150–400)
RBC: 3.52 MIL/uL — ABNORMAL LOW (ref 4.22–5.81)
RDW: 14.2 % (ref 11.5–15.5)
WBC: 20.5 10*3/uL — ABNORMAL HIGH (ref 4.0–10.5)
nRBC: 0 % (ref 0.0–0.2)

## 2023-07-10 LAB — MAGNESIUM: Magnesium: 2.5 mg/dL — ABNORMAL HIGH (ref 1.7–2.4)

## 2023-07-10 LAB — CBG MONITORING, ED
Glucose-Capillary: 482 mg/dL — ABNORMAL HIGH (ref 70–99)
Glucose-Capillary: 504 mg/dL (ref 70–99)
Glucose-Capillary: 543 mg/dL (ref 70–99)

## 2023-07-10 LAB — SARS CORONAVIRUS 2 BY RT PCR: SARS Coronavirus 2 by RT PCR: NEGATIVE

## 2023-07-10 LAB — PROCALCITONIN: Procalcitonin: >150 ng/mL

## 2023-07-10 LAB — HEPATITIS B SURFACE ANTIGEN: Hepatitis B Surface Ag: NONREACTIVE

## 2023-07-10 LAB — CLOSTRIDIUM DIFFICILE BY PCR, REFLEXED: Toxigenic C. Difficile by PCR: NEGATIVE

## 2023-07-10 LAB — HEMOGLOBIN A1C
Hgb A1c MFr Bld: 8.9 % — ABNORMAL HIGH (ref 4.8–5.6)
Mean Plasma Glucose: 208.73 mg/dL

## 2023-07-10 LAB — PHOSPHORUS: Phosphorus: 7.4 mg/dL — ABNORMAL HIGH (ref 2.5–4.6)

## 2023-07-10 LAB — MRSA NEXT GEN BY PCR, NASAL: MRSA by PCR Next Gen: DETECTED — AB

## 2023-07-10 LAB — ETHANOL: Alcohol, Ethyl (B): <10 mg/dL (ref <10)

## 2023-07-10 MED ORDER — INSULIN REGULAR(HUMAN) IN NACL 100-0.9 UT/100ML-% IV SOLN
INTRAVENOUS | Status: DC
  Administered 2023-07-10: 17 [IU]/h via INTRAVENOUS
  Administered 2023-07-10: 10 [IU]/h via INTRAVENOUS
  Filled 2023-07-10 (×2): qty 100

## 2023-07-10 MED ORDER — DEXMEDETOMIDINE HCL IN NACL 400 MCG/100ML IV SOLN
INTRAVENOUS | Status: AC
  Administered 2023-07-10: 0.4 ug/kg/h via INTRAVENOUS
  Filled 2023-07-10: qty 100

## 2023-07-10 MED ORDER — HEPARIN SODIUM (PORCINE) 5000 UNIT/ML IJ SOLN
5000.0000 [IU] | Freq: Three times a day (TID) | INTRAMUSCULAR | Status: DC
  Administered 2023-07-10 – 2023-07-15 (×15): 5000 [IU] via SUBCUTANEOUS
  Filled 2023-07-10 (×15): qty 1

## 2023-07-10 MED ORDER — HEPARIN SODIUM (PORCINE) 1000 UNIT/ML IJ SOLN
5000.0000 [IU] | INTRAMUSCULAR | Status: DC | PRN
  Administered 2023-07-10 (×2): 2000 [IU]
  Filled 2023-07-10 (×4): qty 5

## 2023-07-10 MED ORDER — DEXMEDETOMIDINE HCL IN NACL 400 MCG/100ML IV SOLN: 0.0000 ug/kg/h | INTRAVENOUS | Status: DC

## 2023-07-10 MED ORDER — POTASSIUM CHLORIDE 10 MEQ/100ML IV SOLN
10.0000 meq | INTRAVENOUS | Status: AC
  Administered 2023-07-10 (×2): 10 meq via INTRAVENOUS
  Filled 2023-07-10 (×2): qty 100

## 2023-07-10 MED ORDER — HEPARIN SODIUM (PORCINE) 5000 UNIT/ML IJ SOLN: 5000.0000 [IU] | Freq: Three times a day (TID) | INTRAMUSCULAR | Status: DC

## 2023-07-10 MED ORDER — DEXTROSE IN LACTATED RINGERS 5 % IV SOLN: INTRAVENOUS | Status: AC

## 2023-07-10 MED ORDER — FIDAXOMICIN 200 MG PO TABS: 200.0000 mg | ORAL_TABLET | Freq: Two times a day (BID) | ORAL | Status: DC

## 2023-07-10 MED ORDER — DEXMEDETOMIDINE HCL IN NACL 400 MCG/100ML IV SOLN
0.0000 ug/kg/h | INTRAVENOUS | Status: DC
  Administered 2023-07-10: 0.4 ug/kg/h via INTRAVENOUS

## 2023-07-10 MED ORDER — CHLORHEXIDINE GLUCONATE CLOTH 2 % EX PADS
6.0000 | MEDICATED_PAD | Freq: Every day | CUTANEOUS | Status: DC
  Administered 2023-07-10 – 2023-07-12 (×4): 6 via TOPICAL

## 2023-07-10 MED ORDER — FAMOTIDINE 20 MG PO TABS
10.0000 mg | ORAL_TABLET | Freq: Every day | ORAL | Status: DC
  Administered 2023-07-11 – 2023-07-18 (×7): 10 mg via ORAL
  Filled 2023-07-10 (×7): qty 1

## 2023-07-10 MED ORDER — ACETAMINOPHEN 325 MG PO TABS: 650.0000 mg | ORAL_TABLET | ORAL | Status: DC | PRN

## 2023-07-10 MED ORDER — HEPARIN SODIUM (PORCINE) 1000 UNIT/ML IJ SOLN
1000.0000 [IU] | INTRAMUSCULAR | Status: DC | PRN
  Filled 2023-07-10: qty 1
  Filled 2023-07-10: qty 2
  Filled 2023-07-10: qty 1

## 2023-07-10 MED ORDER — POLYETHYLENE GLYCOL 3350 17 G PO PACK: 17.0000 g | PACK | Freq: Every day | ORAL | Status: DC | PRN

## 2023-07-10 MED ORDER — SODIUM CHLORIDE 0.9 % IV SOLN: INTRAVENOUS | Status: DC

## 2023-07-10 MED ORDER — VANCOMYCIN HCL 750 MG/150ML IV SOLN: 750.0000 mg | INTRAVENOUS | Status: DC | PRN

## 2023-07-10 MED ORDER — MUPIROCIN 2 % EX OINT
1.0000 | TOPICAL_OINTMENT | Freq: Two times a day (BID) | CUTANEOUS | Status: AC
  Administered 2023-07-10 – 2023-07-14 (×9): 1 via NASAL
  Filled 2023-07-10 (×3): qty 22

## 2023-07-10 MED ORDER — DEXTROSE 50 % IV SOLN
0.0000 mL | INTRAVENOUS | Status: DC | PRN
  Administered 2023-07-22 – 2023-07-24 (×3): 50 mL via INTRAVENOUS
  Filled 2023-07-10 (×3): qty 50

## 2023-07-10 MED ORDER — DOCUSATE SODIUM 100 MG PO CAPS: 100.0000 mg | ORAL_CAPSULE | Freq: Two times a day (BID) | ORAL | Status: DC | PRN

## 2023-07-10 MED ORDER — VANCOMYCIN VARIABLE DOSE PER UNSTABLE RENAL FUNCTION (PHARMACIST DOSING): Status: DC

## 2023-07-10 MED ORDER — ACETAMINOPHEN 650 MG RE SUPP
650.0000 mg | RECTAL | Status: DC | PRN
  Administered 2023-07-11: 650 mg via RECTAL
  Filled 2023-07-10: qty 1

## 2023-07-10 MED ORDER — PIPERACILLIN-TAZOBACTAM IN DEX 2-0.25 GM/50ML IV SOLN
2.2500 g | Freq: Three times a day (TID) | INTRAVENOUS | Status: DC
  Administered 2023-07-10: 2.25 g via INTRAVENOUS
  Filled 2023-07-10 (×2): qty 50

## 2023-07-10 MED ORDER — CHLORHEXIDINE GLUCONATE CLOTH 2 % EX PADS
6.0000 | MEDICATED_PAD | Freq: Every day | CUTANEOUS | Status: DC
  Administered 2023-07-11 – 2023-07-13 (×2): 6 via TOPICAL

## 2023-07-10 MED ORDER — ORAL CARE MOUTH RINSE: 15.0000 mL | OROMUCOSAL | Status: DC | PRN

## 2023-07-10 NOTE — H&P (Addendum)
NAME:  [name], MRN:  [id number], DOB:  07-04-1981, LOS: 0 ADMISSION DATE:  07/09/2023, CONSULTATION DATE:  10/12 REFERRING MD:  Dr. Suezanne Jacquet, CHIEF COMPLAINT:  DKA; AMS  History of Present Illness:  Patient is a 42 yo M w/ pertinent PMH ESRD on HD MWF, IDDM, gastroparesis, left AKA, HTN presents to [facility] on 10/11 w/ AMS.  Patient recently admitted to [facility] on 7/12 sepsis from right diabetic foot ulcer. Cultures w/ MRSA. Patient had dialysis catheter removed and replaced. ID treated w/ vanc. Patient refused echo and left on 7/17 against medical advise.   On 10/11 patient was found by family member on ground altered. EMS arrived and dialysis catheter partially removed. Patient more alert for them. Has been complaining of diarrhea x1 week per family. Unsure if patient went to dialysis today. Transferred to [facility] ED. On arrival bp stable 107/70 and afebrile. Patient remains confused and occasionally with agitation. Given dilaudid. CBG 497. Beta H >8. VBG ph 7.06, 16, 54, 4.7. Given iv fluids and started on insulin drip for DKA. CT head negative for acute abnormality. Cxr no significant findings. CT abd/pelvis no acute findings; possible steatosis vs. Liver mass 7 x 4 cm recommending f/u w/ MRI. WBC 20.7 and LA 2.4. Sepsis protocol initiated. Cultures obtained and started on vanc/zosyn. Given ams, dka, and soft bp pccm consulted for icu admission.  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 rate 15, weight 77.6 kg, SpO2 95%.        Intake/Output Summary (Last 24 hours) at 07/10/2023 0030 Last data filed at 07/10/2023 0022 Gross per 24 hour  Intake 1000 ml  Output --  Net 1000 ml   Filed Weights   07/09/23 2300  Weight: 77.6 kg    Examination: General:  ill appearing male w/ ams HEENT: MM pink/dry Neuro: lethargic; slightly opens eyes to voice and mae to pain; pupils 3mm b/l reactive  CV: s1s2, RRR, no m/r/g PULM:  dim clear BS bilaterally; RA GI: soft, bsx4 active  Extremities: warm/dry, Left aka; right lateral foot ulcer with no drainage or erythema Skin: no rashes or lesions    Resolved Hospital Problem list     Assessment & Plan:  DKA IDDM Plan: -Insulin per endotool -CBG monitoring -IV fluid resuscitation -trend B-Hydroxy -serial bmp -check A1c  Acute encephalopathy -likely in setting of dka; sepsis? -CT head no acute abnormality; ammonia, tsh wnl Plan: -treat for DKA and possible sepsis -limit sedating meds -check ethanol  Sepsis?: recent diarrhea x1 week per family; possible gastroenteritis; has had recent hx of MRSA bacteremia and right foot ulcer back in 03/2023 Plan: -cont vanc/zosyn -follow cultures -check pct -trend wbc/fever curve -trend la; continue fluids  ESRD on HD  mwf Chronic hyponatremia Plan: -will need HD cath and nephro consult in am -Trend BMP / urinary output -Replace electrolytes as indicated -Avoid nephrotoxic agents, ensure adequate renal perfusion   Best Practice (right click and "Reselect all SmartList Selections" daily)   Diet/type: NPO DVT prophylaxis: prophylactic heparin  GI prophylaxis: N/A Lines: N/A Foley:  N/A Code Status:  full code Last date of multidisciplinary goals of care discussion [10/12 mother at bedside  updated]  Labs   CBC: Recent Labs  Lab 07/09/23 2231 07/10/23 0014  WBC 20.7*  --   NEUTROABS 18.6*  --   HGB 10.2* 12.6*  HCT 33.3* 37.0*  MCV 92.0  --   PLT 155  --     Basic Metabolic Panel: Recent Labs  Lab 07/09/23 2231 07/10/23 0014  NA 125* 121*  K 5.0 4.8  CL 85*  --   CO2 <7*  --   GLUCOSE 497*  --   BUN 64*  --   CREATININE 16.48*  --   CALCIUM 9.5  --   MG 2.4  --    GFR: Estimated Creatinine Clearance: 5.4 mL/min (A) (by C-G formula based on SCr of 16.48 mg/dL (H)). Recent Labs  Lab 07/09/23 2231 07/09/23 2326  WBC 20.7*  --   LATICACIDVEN  --  2.4*    Liver Function Tests: Recent Labs  Lab 07/09/23 2231  AST 33  ALT 13  ALKPHOS 178*  BILITOT 1.9*  PROT 7.0  ALBUMIN 2.8*   No results for input(s): "LIPASE", "AMYLASE" in the last 168 hours. Recent Labs  Lab 07/09/23 2231  AMMONIA 16    ABG    Component Value Date/Time   HCO3 4.7 (L) 07/10/2023 0014   TCO2 5 (L) 07/10/2023 0014   ACIDBASEDEF 24.0 (H) 07/10/2023 0014   O2SAT 74 07/10/2023 0014     Coagulation Profile: No results for input(s): "INR", "PROTIME" in the last 168 hours.  Cardiac Enzymes: No results for input(s): "CKTOTAL", "CKMB", "CKMBINDEX", "TROPONINI" in the last 168 hours.  HbA1C: Hemoglobin A1C  Date/Time Value Ref Range Status  11/11/2022 12:00 AM 9.5  Final  08/22/2014 06:28 AM 5.8 4.2 - 6.3 % Final    Comment:    The American Diabetes Association recommends that a primary goal of therapy should be <7% and that physicians should reevaluate the treatment regimen in patients with HbA1c values consistently >8%.    HbA1c, POC (controlled diabetic range)  Date/Time Value Ref Range Status  06/25/2022 02:02 PM 10.6 (A) 0.0 - 7.0 % Final   Hgb A1c MFr Bld  Date/Time Value Ref Range Status  04/12/2023 03:00 PM 8.7 (H) 4.8 - 5.6 % Final    Comment:    (NOTE) Pre diabetes:          5.7%-6.4%  Diabetes:              >6.4%  Glycemic control for    <7.0% adults with diabetes   08/09/2021 11:58 AM 9.3 (H) 4.8 - 5.6 % Final    Comment:    (NOTE) Pre diabetes:          5.7%-6.4%  Diabetes:              >6.4%  Glycemic control for   <7.0% adults with diabetes     CBG: No results for input(s): "GLUCAP" in the last 168 hours.  Review of Systems:   Patient is encephalopathic; therefore, history has been obtained from chart review.    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), Hypertension, Renal disorder, and Sepsis ([name]).   Surgical History:   Past Surgical History:  Procedure Laterality Date   AMPUTATION Right 02/02/2018   Procedure: RIGHT FIFTH TOE AND METATARSAL AMPUTATION. Filetted toe flap metatarsal resection. Debridement Plantar Foot wound;  Surgeon: Park Liter, DPM;  Location: [facility] OR;  Service: Podiatry;  Laterality: Right;   AMPUTATION Left 08/20/2018   Procedure: FIFTH METATARSAL BONE BIOPSY;  Surgeon: Park Liter, DPM;  Location: [facility] OR;  Service: Podiatry;  Laterality: Left;   AMPUTATION Left 10/28/2018   Procedure: LEFT GREAT TOE AMPUTATION;  Surgeon: Park Liter, DPM;  Location: [facility] OR;  Service: Podiatry;  Laterality: Left;   AMPUTATION Left 09/16/2020   Procedure: AMPUTATION BELOW KNEE;  Surgeon: Larina Earthly, MD;  Location: [facility] OR;  Service: Vascular;  Laterality: Left;   AMPUTATION Left 10/15/2020   Procedure: AMPUTATION ABOVE KNEE;  Surgeon: Sherren Kerns, MD;  Location: [facility] OR;  Service: Vascular;  Laterality: Left;   APPLICATION OF WOUND VAC  02/02/2018   Procedure: APPLICATION OF WOUND VAC  Right Foot;  Surgeon: Park Liter, DPM;  Location: [facility] OR;  Service: Podiatry;;   APPLICATION OF WOUND VAC Left  10/28/2018   Procedure: APPLICATION OF WOUND VAC LEFT TOE;  Surgeon: Park Liter, DPM;  Location: [facility] OR;  Service: Podiatry;  Laterality: Left;   APPLICATION OF WOUND VAC Left 11/01/2018   Procedure: APPLICATION OF WOUND VAC;  Surgeon: Park Liter, DPM;  Location: [facility] OR;  Service: Podiatry;  Laterality: Left;   AV FISTULA PLACEMENT     left arm.   AV FISTULA PLACEMENT Right 12/22/2016   Procedure: INSERTION OF ARTERIOVENOUS (AV) GORE-TEX GRAFT ARM;  Surgeon: Sherren Kerns, MD;  Location: [facility] OR;  Service: Vascular;  Laterality: Right;   AV FISTULA PLACEMENT Left 05/26/2018   Procedure: INSERTION OF  ARTERIOVENOUS (AV) GORE-TEX GRAFT LEFT ARM;  Surgeon: Nada Libman, MD;  Location: [facility] OR;  Service: Vascular;  Laterality: Left;   EYE SURGERY     I & D EXTREMITY Right 10/31/2017   Procedure: IRRIGATION AND DEBRIDEMENT RIGHT FOOT;  Surgeon: Park Liter, DPM;  Location: [facility] OR;  Service: Podiatry;  Laterality: Right;   I & D EXTREMITY Left 08/20/2018   Procedure: IRRIGATION AND DEBRIDEMENT EXTREMITY WITH SECONDARY WOUND CLOSUREAND APPLICATION OF WOUND VAC LEFT FOOT;  Surgeon: Park Liter, DPM;  Location: [facility] OR;  Service: Podiatry;  Laterality: Left;   I & D EXTREMITY Left 10/20/2018   Procedure: IRRIGATION AND DEBRIDEMENT LEFT FOOT  DEBRIDEMENT LATERAL FOOT WOUND;  Surgeon: Park Liter, DPM;  Location: [facility] OR;  Service: Podiatry;  Laterality: Left;   I & D EXTREMITY Left 10/28/2018   Procedure: IRRIGATION AND DEBRIDEMENT LEFT TOE;  Surgeon: Park Liter, DPM;  Location: [facility] OR;  Service: Podiatry;  Laterality: Left;   I & D EXTREMITY Left 09/14/2020   Procedure: IRRIGATION AND DEBRIDEMENT WRIST;  Surgeon: Betha Loa, MD;  Location: [facility] OR;  Service: Orthopedics;  Laterality: Left;   INSERTION OF DIALYSIS CATHETER     Right subclavian   IR AV DIALY SHUNT INTRO NEEDLE/INTRACATH INITIAL W/PTA/IMG RIGHT Right 02/05/2018   IR FLUORO GUIDE CV  LINE LEFT  04/14/2023   IR FLUORO  GUIDE CV LINE RIGHT  01/31/2020   IR FLUORO GUIDE CV LINE RIGHT  01/30/2022   IR PTA VENOUS EXCEPT DIALYSIS CIRCUIT  01/30/2022   IR REMOVAL TUN CV CATH W/O FL  04/12/2023   IR THROMBECTOMY AV FISTULA W/THROMBOLYSIS/PTA INC/SHUNT/IMG LEFT Left 08/24/2018   IR THROMBECTOMY AV FISTULA W/THROMBOLYSIS/PTA INC/SHUNT/IMG LEFT Left 01/06/2019   IR US GUIDE VASC ACCESS LEFT  08/24/2018   IR US GUIDE VASC ACCESS LEFT  04/14/2023   IR US GUIDE VASC ACCESS RIGHT  02/05/2018   IR US GUIDE VASC ACCESS RIGHT  01/31/2020   IR VENOCAVAGRAM SVC  01/30/2022   IRRIGATION AND DEBRIDEMENT FOOT Right 10/23/2018   Procedure: Irrigation and Debridement to tendon, Left Foot;  Surgeon: Park Liter, DPM;  Location: [facility] OR;  Service: Podiatry;  Laterality: Right;   IRRIGATION AND DEBRIDEMENT FOOT Left 11/01/2018   Procedure: IRRIGATION AND DEBRIDEMENT PARTIAL WOUND CLOSURE LOCAL TISSUE TRANSFER AND FLAP ROTATION, LEFT FOOT;  Surgeon: Park Liter, DPM;  Location: [facility] OR;  Service: Podiatry;  Laterality: Left;   TRANSMETATARSAL AMPUTATION N/A 08/18/2018   Procedure: IRRIGATION AND DEBRIDEMENT OF LEFT 5TH TOE AND TRANSMETATARSAL, WITH PARTICAL LEFT 5TH TOE AND METATARSAL AMPUTATION, BONE BIOPSY, WOUND VAC APPLICATION.;  Surgeon: Park Liter, DPM;  Location: [facility] OR;  Service: Podiatry;  Laterality: N/A;   UPPER EXTREMITY VENOGRAPHY N/A 11/16/2016   Procedure: Upper Extremity Venography - Right Central;  Surgeon: Sherren Kerns, MD;  Location: [facility] INVASIVE CV LAB;  Service: Cardiovascular;  Laterality: N/A;   UPPER EXTREMITY VENOGRAPHY N/A 05/25/2018   Procedure: UPPER EXTREMITY VENOGRAPHY - Bilateral;  Surgeon: Cephus Shelling, MD;  Location: [facility] INVASIVE CV LAB;  Service: Cardiovascular;  Laterality: N/A;     Social History:   reports that he has never smoked. He has never used smokeless tobacco. He reports that he does not drink alcohol and does not use drugs.   Family History:  His family history includes Diabetes in  his father; Diabetes Mellitus II in an other family member; Renal Disease in his father.   Allergies No Known Allergies   Home Medications  Prior to Admission medications   Medication Sig Start Date End Date Taking? Authorizing Provider  calcitRIOL (ROCALTROL) 0.5 MCG capsule Take 3 capsules (1.5 mcg total) by mouth every Monday, Wednesday, and Friday with hemodialysis. 05/23/21   Kathlen Mody, MD  cinacalcet (SENSIPAR) 60 MG tablet Take 120 mg by mouth every evening. 04/10/22   [provider]  gabapentin (NEURONTIN) 100 MG capsule TAKE 1 CAPSULE BY MOUTH THREE TIMES DAILY 07/05/23   [name], NP  glucose blood (ONETOUCH VERIO) test strip USE 1 STRIP TO CHECK GLUCOSE THREE TIMES DAILY AS DIRECTED 05/12/23   [name], NP  insulin NPH-regular Human (NOVOLIN 70/30) (70-30) 100 UNIT/ML injection Inject 10 Units into the skin 2 (two) times daily with a meal. 12/18/22   [name], NP  multivitamin (RENA-VIT) TABS tablet Take 1 tablet by mouth at bedtime. 05/23/21   Kathlen Mody, MD  NOVOLIN R 100 UNIT/ML injection INJECT 12 UNITS SUBCUTANEOUSLY THREE TIMES DAILY BEFORE MEAL(S) 07/05/23   [name], NP  sevelamer carbonate (RENVELA) 800 MG tablet Take 2,400-4,000 mg by mouth See admin instructions. Take 4,000 mg (5 tablets) by mouth with meals and 2,400 mg (3 tablets) with snacks 04/27/19   [provider]     Critical care time: 45 minutes  JD Anselm Lis [facility] 07/10/2023, 1:23 AM  Please see [email or web address] for pager details.  From 7A-7P if no response, please call [phone]. After hours, please call ELink [phone].

## 2023-07-10 NOTE — Progress Notes (Addendum)
NAME:  [name], MRN:  [id number], DOB:  1981/07/12, LOS: 0 ADMISSION DATE:  07/09/2023, CONSULTATION DATE:  10/12 REFERRING MD:  Dr. Suezanne Jacquet, CHIEF COMPLAINT:  DKA; AMS  History of Present Illness:  Patient is a 42 yo M w/ pertinent PMH ESRD on HD MWF, IDDM, gastroparesis, left AKA, HTN presents to [facility] on 10/11 w/ AMS.  Patient recently admitted to [facility] on 7/12 sepsis from right diabetic foot ulcer. Cultures w/ MRSA. Patient had dialysis catheter removed and replaced. ID treated w/ vanc. Patient refused echo and left on 7/17 against medical advise.   On 10/11 patient was found by family member on ground altered. EMS arrived and dialysis catheter partially removed. Patient more alert for them. Has been complaining of diarrhea x1 week per family. Unsure if patient went to dialysis today. Transferred to [facility] ED. On arrival bp stable 107/70 and afebrile. Patient remains confused and occasionally with agitation. Given dilaudid. CBG 497. Beta H >8. VBG ph 7.06, 16, 54, 4.7. Given iv fluids and started on insulin drip for DKA. CT head negative for acute abnormality. Cxr no significant findings. CT abd/pelvis no acute findings; possible steatosis vs. Liver mass 7 x 4 cm recommending f/u w/ MRI. WBC 20.7 and LA 2.4. Sepsis protocol initiated. Cultures obtained and started on vanc/zosyn. Given ams, dka, and soft bp pccm consulted for icu admission.  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 rate 13, weight 77 kg, SpO2 97%.        Intake/Output Summary (Last 24 hours) at 07/10/2023 0820 Last data filed at 07/10/2023 9562 Gross per 24 hour  Intake 2052.37 ml  Output --  Net 2052.37 ml   Filed Weights   07/09/23 2300 07/10/23 1308  Weight: 77.6 kg 77 kg    Examination: General: Acute on chronically ill appearing adult male lying in bed on mechanical ventilation, in NAD HEENT: /AT, MM pink/moist, PERRL,  Neuro: Sleepy but arousal  CV: s1s2 regular rate and rhythm, no murmur, rubs, or gallops,  PULM:  Clear to auscultation, no increased work of breathing, no added breath sounds GI: soft, bowel sounds active in all 4 quadrants, non-tender, non-distended Extremities: warm/dry, no edema  Skin: no rashes or lesions  Resolved Hospital Problem list     Assessment & Plan:   Diabetic ketoacidosis  -pH 7.06, CO2 < 7, Anion Gap not able to calculate with AMS meting criteria for severe DKA  IDDM -Hemoglobin A1C 8.9 P: Aggressive IV hydration ongoing Continue insulin drip  Every 4 BMP Maintain potassium greater than 4 CBG checks every hour Once blood glucose falls below 250 start patient on D5 IV fluids When I anion gap closes and acidosis resolved transition to  subcu long-acting insulin in slowly turned drip off over the next 1-2 hours NPO/noncaloric clears until gap closed Resume home insulin regiment once appropriate  Acute encephalopathy -likely in setting of dka; sepsis? -CT head no acute abnormality; ammonia, tsh wnl P: Maintain neuro protective measures Nutrition and bowel regiment  Seizure precautions  Aspirations precautions  Minimize sedation   Sepsis with recent MRSA bacteremia   -Recent diarrhea x1 week per family; possible gastroenteritis;  has had recent hx of MRSA bacteremia and right foot ulcer with culture positive 04/09/2023 -Procal > 150 on admit  P: Likley need to re-consult ID Supplemental oxygen as needed  Follow cultures  Continue vanc and zosyn  Trend lactic acid  ESRD on HD mwf -Unsure last date of iHD Malpositioned tunnel HD cath  -Per EMS patients tunnled HD cath was partially dislodged (placed 04/14/23) on arrival with eventual removed on admission  Chronic hyponatremia P: Consult Nephrology  Will need temporary HD cath  Follow renal function Trend Bmet Avoid nephrotoxins Ensure adequate renal perfusion   Hyponatremia P:  Supplement as needed  Trend BMP   Diarrhea  -Reported diarrhea prior to admission with observed mucus-like appearance this a.m.  This in the setting of recent antibiotics need to rule out C. Difficile P: Enteric precautions Check C. difficile PCR  Best Practice (right click and "Reselect all SmartList Selections" daily)   Diet/type: NPO DVT prophylaxis: prophylactic heparin  GI prophylaxis: N/A Lines: N/A Foley:  N/A Code Status:  full code Last date of multidisciplinary goals of care discussion [10/12 mother at bedside updated]  Critical care time:   CRITICAL CARE Performed by: Whitney D. Harris  Total critical care time: 30 min additional critical care time   Critical care time was exclusive of separately billable procedures and treating other patients.  Critical care was necessary to treat or prevent imminent or life-threatening deterioration.  Critical care was time spent personally by me on the following activities: development of treatment plan with patient and/or surrogate as well as nursing, discussions with consultants, evaluation of patient's response to treatment, examination of patient, obtaining history from patient or surrogate, ordering and performing treatments and interventions, ordering and review of laboratory studies, ordering and review of  radiographic studies, pulse oximetry and re-evaluation of patient's condition.  Whitney D. Tiburcio Pea, NP-C [facility] Personal contact information can be found on Amion  If no contact or response made please call [phone] 07/10/2023, 9:01 AM   Patient seen, independently examined, care plan was formulated and discussed with Janeann Forehand as per documentation Patient with end-stage renal disease, IDDM, gastroparesis, hypertension brought into the hospital for altered mental status Recently treated for MRSA infection, dialysis catheter removed with a temporary one placed, malpositioned catheter, currently does not have dialysis catheter in place, left AMA from that hospitalization Altered mental status, elevated blood glucose with acidemia  Middle-age, does not appear to be in distress, encephalopathic Acute on chronically ill-appearing Moist oral mucosa S1-S2 appreciated Clear breath sounds Bowel sounds appreciated Left AKA Skin is warm and dry  I reviewed nursing notes, last 24 h vitals and pain scores, last 48 h intake and output, last 24 h labs and trends, and last 24 h imaging results.  Diabetic ketoacidosis -Continue hydration, insulin drip, monitor electrolytes.  DKA management per protocol  Acute metabolic encephalopathy -Neuroprotective measures- normothermia, euglycemia, HOB greater than 30, head in neutral alignment, normocapnia, normoxia -Seizure precautions, aspiration precautions  Sepsis History of recent MRSA bacteremia -Elevated procalcitonin -Was  treated with vancomycin -Resume vancomycin, continue Zosyn  Malpositioned tunneled HD cath -Was removed on admission -Will require a new HD cath for dialysis  Chronic hyponatremia -Continue to monitor -Supplementation as needed  Diarrhea -C. difficile pending -Maintain enteric precautions  C. difficile positive -Oral fidaxomicin 200 mg twice daily for 10 days   The patient is critically ill  with multiple organ systems failure and requires high complexity decision making for assessment and support, frequent evaluation and titration of therapies, application of advanced monitoring technologies and extensive interpretation of multiple databases. Critical Care Time devoted to patient care services described in this note independent of APP/resident time (if applicable)  is 33 minutes.   Virl Diamond MD [place] [facility] Personal pager: See Amion If unanswered, please page CCM On-call: #[phone]

## 2023-07-10 NOTE — Procedures (Signed)
Central Venous Catheter Insertion Procedure Note  [name]  [id number]  12-May-1981  Date:07/10/23  Time:3:24 PM   Provider Performing:Alyza Artiaga A [name]   Procedure: Insertion of Non-tunneled Central Venous Catheter(36556)with US guidance (40981)    Indication(s) Hemodialysis  Consent Risks of the procedure as well as the alternatives and risks of each were explained to the patient and/or caregiver.  Consent for the procedure was obtained and is signed in the bedside chart  Anesthesia Topical only with 1% lidocaine   Timeout Verified patient identification, verified procedure, site/side was marked, verified correct patient position, special equipment/implants available, medications/allergies/relevant history reviewed, required imaging and test results available.  Sterile Technique Maximal sterile technique including full sterile barrier drape, hand hygiene, sterile gown, sterile gloves, mask, hair covering, sterile ultrasound probe cover (if used).  Procedure Description Area of catheter insertion was cleaned with chlorhexidine and draped in sterile fashion.   With real-time ultrasound guidance a HD catheter was placed into the left internal jugular vein.  Nonpulsatile blood flow and easy flushing noted in all ports.  The catheter was sutured in place and sterile dressing applied.  Complications/Tolerance None; patient tolerated the procedure well. Chest X-ray is ordered to verify placement for internal jugular or subclavian cannulation.  Chest x-ray is not ordered for femoral cannulation.  EBL Minimal  Specimen(s) None

## 2023-07-10 NOTE — Procedures (Signed)
Central Venous Catheter Insertion Procedure Note  [name]  [id number]  12/16/1980  Date:07/10/23  Time:4:27 PM   Provider Performing:[name] [name]   Procedure: Insertion of Non-tunneled Central Venous Catheter(36556)with US guidance (40981)    Indication(s) Hemodialysis  Consent Risks of the procedure as well as the alternatives and risks of each were explained to the patient and/or caregiver.  Consent for the procedure was obtained and is signed in the bedside chart  Anesthesia Topical only with 1% lidocaine   Timeout Verified patient identification, verified procedure, site/side was marked, verified correct patient position, special equipment/implants available, medications/allergies/relevant history reviewed, required imaging and test results available.  Sterile Technique Maximal sterile technique including full sterile barrier drape, hand hygiene, sterile gown, sterile gloves, mask, hair covering, sterile ultrasound probe cover (if used).  Procedure Description Area of catheter insertion was cleaned with chlorhexidine and draped in sterile fashion.   With real-time ultrasound guidance Shane HD catheter was placed into the right femoral vein.  Nonpulsatile blood flow and easy flushing noted in all ports.  The catheter was sutured in place and sterile dressing applied.  Complications/Tolerance None; patient tolerated the procedure well. Chest X-ray is ordered to verify placement for internal jugular or subclavian cannulation.  Chest x-ray is not ordered for femoral cannulation.  EBL Minimal  Specimen(s) None

## 2023-07-10 NOTE — Consult Note (Signed)
[facility]       Reason for Consult: bacteremia    Referring Physician: Dr. Wynona Neat  Active Problems:   DKA (diabetic ketoacidosis) ([name])   Altered mental status    Chlorhexidine Gluconate Cloth  6 each Topical Daily   [START ON 07/11/2023] Chlorhexidine Gluconate Cloth  6 each Topical Q0600   famotidine  10 mg Oral Daily   heparin  5,000 Units Subcutaneous Q8H   mupirocin ointment  1 Application Nasal BID    Recommendations: Vancomycin  TTE  Repeat blood cultures Line holiday when able  Assessment:  He has MRSA bacteremia with a catheter previously in place and now with need for a new catheter prior to a line holiday.   Continue with vancomycin Cdiff with negative toxin and negative PCR    HPI: [name] is a 42 y.o. male with a history of ESRD, diabetes and on intermittent hemodialysis with a tunneled catheter came in with altered mentation.  Found by family member and EMS brought him in.  Tunneled catheter was partially out and now completely removed.  Needs dialysis so new catheter to be put in today.  Now with positive blood cultures in 4/4 blood culture bottles. He previously was admitted inJuly with MRSA bacteremia and left AMA.    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, with necrosis of bone ([name])    Dialysis patient ([name])    Hypertension    Renal disorder    Dialysis   Sepsis ([name])     Social History   Tobacco Use   Smoking status: Never   Smokeless tobacco: Never  Vaping Use   Vaping status: Never Used  Substance Use Topics   Alcohol use: No   Drug use: No    Family History  Problem Relation Age of Onset   Diabetes  Mellitus II Other    Diabetes Father    Renal Disease Father        ESRD    No Known Allergies  Physical Exam: Constitutional: in no apparent distress  Vitals:   07/10/23 1000 07/10/23 1212  BP: (!) 109/59   Pulse: 88   Resp: 17   Temp:  (!) 97 F (36.1 C)  SpO2: (!) 89%    EYES: anicteric Respiratory: normal respirtory effort GI: soft Musculoskeletal: rifght foot with hyperpigmented hardened area    Lab Results  Component Value Date   WBC 20.5 (H) 07/10/2023   HGB 10.0 (L) 07/10/2023   HCT 33.7 (L) 07/10/2023   MCV 95.7 07/10/2023   PLT 153 07/10/2023    Lab Results  Component Value Date   CREATININE 15.22 (H) 07/10/2023   BUN 70 (H) 07/10/2023   NA 129 (L) 07/10/2023   K 4.3 07/10/2023   CL 95 (L) 07/10/2023   CO2 17 (L) 07/10/2023    Lab Results  Component Value Date   ALT 16 07/10/2023   AST 29 07/10/2023   ALKPHOS 176 (H) 07/10/2023     Microbiology: Recent Results (from the past 240 hour(s))  Culture, blood (routine x 2)     Status: None (Preliminary result)   Collection Time:  07/10/23 12:15 AM   Specimen: BLOOD  Result Value Ref Range Status   Specimen Description BLOOD BLOOD RIGHT ARM  Final   Special Requests   Final    BOTTLES DRAWN AEROBIC AND ANAEROBIC Blood Culture results may not be optimal due to an excessive volume of blood received in culture bottles   Culture  Setup Time   Final    GRAM POSITIVE COCCI IN CLUSTERS IN BOTH AEROBIC AND ANAEROBIC BOTTLES CRITICAL RESULT CALLED TO, READ BACK BY AND VERIFIED WITH: Ebony Cargo [id number] AT 1234 BY EC Performed at [facility], [address] [place], [name] [address]    Culture GRAM POSITIVE COCCI IN CLUSTERS  Final   Report Status PENDING  Incomplete  Blood Culture ID Panel (Reflexed)     Status: Abnormal   Collection Time: 07/10/23 12:15 AM  Result Value Ref Range Status   Enterococcus faecalis NOT DETECTED NOT DETECTED Final   Enterococcus Faecium NOT DETECTED NOT  DETECTED Final   Listeria monocytogenes NOT DETECTED NOT DETECTED Final   Staphylococcus species DETECTED (A) NOT DETECTED Final    Comment: CRITICAL RESULT CALLED TO, READ BACK BY AND VERIFIED WITH: PHARMD JENNY ZHOU [phone] AT 1234 BY EC    Staphylococcus aureus (BCID) DETECTED (A) NOT DETECTED Final    Comment: Methicillin (oxacillin)-resistant Staphylococcus aureus (MRSA). MRSA is predictably resistant to beta-lactam antibiotics (except ceftaroline). Preferred therapy is vancomycin unless clinically contraindicated. Patient requires contact precautions if  hospitalized. CRITICAL RESULT CALLED TO, READ BACK BY AND VERIFIED WITH: PHARMD JENNY ZHOU [phone] AT 1234 BY EC    Staphylococcus epidermidis NOT DETECTED NOT DETECTED Final   Staphylococcus lugdunensis NOT DETECTED NOT DETECTED Final   Streptococcus species NOT DETECTED NOT DETECTED Final   Streptococcus agalactiae NOT DETECTED NOT DETECTED Final   Streptococcus pneumoniae NOT DETECTED NOT DETECTED Final   Streptococcus pyogenes NOT DETECTED NOT DETECTED Final   A.calcoaceticus-baumannii NOT DETECTED NOT DETECTED Final   Bacteroides fragilis NOT DETECTED NOT DETECTED Final   Enterobacterales NOT DETECTED NOT DETECTED Final   Enterobacter cloacae complex NOT DETECTED NOT DETECTED Final   Escherichia coli NOT DETECTED NOT DETECTED Final   Klebsiella aerogenes NOT DETECTED NOT DETECTED Final   Klebsiella oxytoca NOT DETECTED NOT DETECTED Final   Klebsiella pneumoniae NOT DETECTED NOT DETECTED Final   Proteus species NOT DETECTED NOT DETECTED Final   Salmonella species NOT DETECTED NOT DETECTED Final   Serratia marcescens NOT DETECTED NOT DETECTED Final   Haemophilus influenzae NOT DETECTED NOT DETECTED Final   Neisseria meningitidis NOT DETECTED NOT DETECTED Final   Pseudomonas aeruginosa NOT DETECTED NOT DETECTED Final   Stenotrophomonas maltophilia NOT DETECTED NOT DETECTED Final   Candida albicans NOT DETECTED NOT DETECTED  Final   Candida auris NOT DETECTED NOT DETECTED Final   Candida glabrata NOT DETECTED NOT DETECTED Final   Candida krusei NOT DETECTED NOT DETECTED Final   Candida parapsilosis NOT DETECTED NOT DETECTED Final   Candida tropicalis NOT DETECTED NOT DETECTED Final   Cryptococcus neoformans/gattii NOT DETECTED NOT DETECTED Final   Meth resistant mecA/C and MREJ DETECTED (A) NOT DETECTED Final    Comment: CRITICAL RESULT CALLED TO, READ BACK BY AND VERIFIED WITH: Valarie Merino ZHOU [phone] AT 1234 BY EC Performed at [facility], [address], [place], [name] [address]   Culture, blood (routine x 2)     Status: None (Preliminary result)   Collection Time: 07/10/23 12:16 AM   Specimen: BLOOD  Result Value Ref Range Status   Specimen Description BLOOD BLOOD RIGHT HAND  Final   Special Requests   Final    BOTTLES DRAWN AEROBIC AND ANAEROBIC Blood Culture results may not be optimal due to an excessive volume of blood received in culture bottles   Culture  Setup Time   Final    GRAM POSITIVE COCCI IN CLUSTERS IN BOTH AEROBIC AND ANAEROBIC BOTTLES CRITICAL VALUE NOTED.  VALUE IS CONSISTENT WITH PREVIOUSLY REPORTED AND CALLED VALUE. Performed at [facility], [address] [place], [name] [address]    Culture GRAM POSITIVE COCCI IN CLUSTERS  Final   Report Status PENDING  Incomplete  SARS Coronavirus 2 by RT PCR (hospital order, performed in [facility] hospital lab) *cepheid single result test*     Status: None   Collection Time: 07/10/23 12:17 AM   Specimen: Nasal Swab  Result Value Ref Range Status   SARS Coronavirus 2 by RT PCR NEGATIVE NEGATIVE Final    Comment: Performed at [facility], [address] [place], [name] [address]  MRSA Next Gen by PCR, Nasal     Status: Abnormal   Collection Time: 07/10/23  3:26 AM   Specimen: Nasal Mucosa; Nasal Swab  Result Value Ref Range Status   MRSA by PCR Next Gen DETECTED (A) NOT DETECTED Final    Comment: RESULT CALLED  TO, READ BACK BY AND VERIFIED WITH: B CUMMINGS,RN@[id number]  07/10/23 MK (NOTE) The GeneXpert MRSA Assay (FDA approved for NASAL specimens only), is one component of a comprehensive MRSA colonization surveillance program. It is not intended to diagnose MRSA infection nor to guide or monitor treatment for MRSA infections. Test performance is not FDA approved in patients less than 60 years old. Performed at [facility], [address] [place], [name] [address]   C Difficile Quick Screen w PCR reflex     Status: Abnormal   Collection Time: 07/10/23  8:32 AM   Specimen: STOOL  Result Value Ref Range Status   C Diff antigen POSITIVE (A) NEGATIVE Final   C Diff toxin NEGATIVE NEGATIVE Final   C Diff interpretation Results are indeterminate. See PCR results.  Final    Comment: Performed at [facility], [address], [place], [name] [address]  C. Diff by PCR, Reflexed     Status: None   Collection Time: 07/10/23  8:32 AM  Result Value Ref Range Status   Toxigenic C. Difficile by PCR NEGATIVE NEGATIVE Final    Comment: Patient is colonized with non toxigenic C. difficile. May not need treatment unless significant symptoms are present. Performed at [facility], [address] [place], [name] [address]     Gardiner Barefoot, MD [email or web address] 07/10/2023, 2:15 PM

## 2023-07-10 NOTE — Progress Notes (Signed)
Patient was started on Precedex to assist with placing hemodialysis catheter  Discontinued after procedure

## 2023-07-10 NOTE — Inpatient Diabetes Management (Signed)
Inpatient Diabetes Program Recommendations  AACE/ADA: New Consensus Statement on Inpatient Glycemic Control (2015)  Target Ranges:  Prepandial:   less than 140 mg/dL      Peak postprandial:   less than 180 mg/dL (1-2 hours)      Critically ill patients:  140 - 180 mg/dL   Lab Results  Component Value Date   GLUCAP 236 (H) 07/10/2023   HGBA1C 8.9 (H) 07/10/2023    Review of Glycemic Control  Diabetes history: DM1 Outpatient Diabetes medications: Novolin R 12 TID, previously on Novolin 70/30 Current orders for Inpatient glycemic control: IV insulin per [employer] for DKA  HgbA1C - 8.9%  Inpatient Diabetes Program Recommendations:    Continue IV insulin until criteria met for discontinuation.  Give Semglee 1-2 hours before d/cing drip  Follow.  Thank you. Ailene Ards, RD, LDN, CDCES Inpatient Diabetes Coordinator [phone]

## 2023-07-10 NOTE — Progress Notes (Signed)
Malpositioned left IJ line noted on chest x-ray  Will remove line.  Right femoral HD cath placed

## 2023-07-10 NOTE — ED Notes (Signed)
ED TO INPATIENT HANDOFF REPORT  ED Nurse Name and Phone #: Joselyn Glassman [phone])  S Name/Age/Gender [name] 42 y.o. male Room/Bed: 026C/026C  Code Status   Code Status: Full Code  Home/SNF/Other Home Patient oriented to: self Is this baseline? No   Triage Complete: Triage complete  Chief Complaint DKA (diabetic ketoacidosis) ([name]) [E11.10]  Triage Note Pt BIB EMS from home. Sister found pt in floor with AMS. Last seen on Wednesday per family. EMS reports scene appeared like pt may have fallen oob. Pt only c/o of being cold, oriented to person, usually A&Ox4. Diarrhea x1wk per family.  Dialysis on  MWF, unsure if pt went today, unlikely since pt usually drives self per ems.   EMS VS: cbg 461, HR 96, 129/55, 98% RA   Allergies No Known Allergies  Level of Care/Admitting Diagnosis ED Disposition     ED Disposition  Admit   Condition  --   Comment  Hospital [facility] [[id number]]  Level of Care: ICU [6]  May admit patient to [facility] or [facility] if equivalent level of care is available:: Yes  Covid Evaluation: Symptomatic Person Under Investigation (PUI) or recent exposure (last 10 days) *Testing Required*  Diagnosis: DKA (diabetic ketoacidosis) [facility]) [[id number]]  Admitting Physician: Patrici Ranks [[id number]]  Attending Physician: Patrici Ranks [[id number]]  Certification:: I certify this patient will need inpatient services for at least 2 midnights  Expected Medical Readiness: 07/12/2023          B Medical/Surgery History Past Medical History:  Diagnosis Date   Anemia    Cellulitis and abscess of toe of left foot    Diabetes mellitus without complication ([name])    Diabetic gastroparesis ([name])    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 Filetted toe flap metatarsal resection. Debridement Plantar Foot wound;  Surgeon: Park Liter, DPM;  Location: [facility] OR;  Service: Podiatry;  Laterality: Right;   AMPUTATION Left 08/20/2018   Procedure: FIFTH METATARSAL BONE BIOPSY;  Surgeon: Park Liter, DPM;  Location: [facility] OR;  Service: Podiatry;  Laterality: Left;   AMPUTATION Left 10/28/2018   Procedure: LEFT GREAT TOE AMPUTATION;  Surgeon: Park Liter, DPM;  Location: [facility] OR;  Service: Podiatry;  Laterality: Left;   AMPUTATION Left 09/16/2020   Procedure: AMPUTATION BELOW KNEE;  Surgeon: Larina Earthly, MD;  Location: [facility] OR;  Service: Vascular;  Laterality: Left;   AMPUTATION Left 10/15/2020   Procedure: AMPUTATION ABOVE KNEE;  Surgeon: Sherren Kerns, MD;  Location: [facility] OR;  Service: Vascular;  Laterality: Left;   APPLICATION OF WOUND VAC  02/02/2018   Procedure: APPLICATION OF WOUND VAC  Right Foot;  Surgeon: Park Liter, DPM;  Location: [facility] OR;  Service: Podiatry;;   APPLICATION OF WOUND VAC Left 10/28/2018   Procedure: APPLICATION OF WOUND VAC LEFT TOE;  Surgeon: Park Liter, DPM;  Location: [facility] OR;  Service: Podiatry;  Laterality: Left;   APPLICATION OF WOUND VAC Left 11/01/2018   Procedure: APPLICATION OF WOUND VAC;  Surgeon: Park Liter, DPM;  Location: [facility] OR;  Service: Podiatry;  Laterality: Left;   AV  FISTULA PLACEMENT     left arm.   AV FISTULA PLACEMENT Right 12/22/2016   Procedure: INSERTION OF ARTERIOVENOUS (AV) GORE-TEX GRAFT ARM;  Surgeon: Sherren Kerns, MD;  Location: [facility] OR;  Service: Vascular;  Laterality: Right;   AV FISTULA PLACEMENT Left 05/26/2018   Procedure: INSERTION OF  ARTERIOVENOUS (AV) GORE-TEX GRAFT LEFT ARM;  Surgeon: Nada Libman, MD;  Location: [facility] OR;  Service: Vascular;  Laterality: Left;   EYE SURGERY     I & D EXTREMITY Right 10/31/2017   Procedure: IRRIGATION AND  DEBRIDEMENT RIGHT FOOT;  Surgeon: Park Liter, DPM;  Location: [facility] OR;  Service: Podiatry;  Laterality: Right;   I & D EXTREMITY Left 08/20/2018   Procedure: IRRIGATION AND DEBRIDEMENT EXTREMITY WITH SECONDARY WOUND CLOSUREAND APPLICATION OF WOUND VAC LEFT FOOT;  Surgeon: Park Liter, DPM;  Location: [facility] OR;  Service: Podiatry;  Laterality: Left;   I & D EXTREMITY Left 10/20/2018   Procedure: IRRIGATION AND DEBRIDEMENT LEFT FOOT  DEBRIDEMENT LATERAL FOOT WOUND;  Surgeon: Park Liter, DPM;  [facility] OR;  Service: Podiatry;  Laterality: Left;   I & D EXTREMITY Left 10/28/2018   Procedure: IRRIGATION AND DEBRIDEMENT LEFT TOE;  Surgeon: Park Liter, DPM;  [facility] OR;  Service: Podiatry;  Laterality: Left;   I & D EXTREMITY Left 09/14/2020   Procedure: IRRIGATION AND DEBRIDEMENT WRIST;  Surgeon: Betha Loa, MD;  Location: [facility] OR;  Service: Orthopedics;  Laterality: Left;   INSERTION OF DIALYSIS CATHETER     Right subclavian   IR AV DIALY SHUNT INTRO NEEDLE/INTRACATH INITIAL W/PTA/IMG RIGHT Right 02/05/2018   IR FLUORO GUIDE CV LINE LEFT  04/14/2023   IR FLUORO GUIDE CV LINE RIGHT  01/31/2020   IR FLUORO GUIDE CV LINE RIGHT  01/30/2022   IR PTA VENOUS EXCEPT DIALYSIS CIRCUIT  01/30/2022   IR REMOVAL TUN CV CATH W/O FL  04/12/2023   IR THROMBECTOMY AV FISTULA W/THROMBOLYSIS/PTA INC/SHUNT/IMG LEFT Left 08/24/2018   IR THROMBECTOMY AV FISTULA W/THROMBOLYSIS/PTA INC/SHUNT/IMG LEFT Left 01/06/2019   IR US GUIDE VASC ACCESS LEFT  08/24/2018   IR US GUIDE VASC ACCESS LEFT  04/14/2023   IR US GUIDE VASC ACCESS RIGHT  02/05/2018   IR US GUIDE VASC ACCESS RIGHT  01/31/2020   IR VENOCAVAGRAM SVC  01/30/2022   IRRIGATION AND DEBRIDEMENT FOOT Right 10/23/2018   Procedure: Irrigation and Debridement to tendon, Left Foot;  Surgeon: Park Liter, DPM;  Location[facility] OR;  Service: Podiatry;  Laterality: Right;   IRRIGATION AND DEBRIDEMENT FOOT Left 11/01/2018   Procedure: IRRIGATION AND DEBRIDEMENT  PARTIAL WOUND CLOSURE LOCAL TISSUE TRANSFER AND FLAP ROTATION, LEFT FOOT;  Surgeon: Park Liter, DPM;  Location: [facility] OR;  Service: Podiatry;  Laterality: Left;   TRANSMETATARSAL AMPUTATION N/A 08/18/2018   Procedure: IRRIGATION AND DEBRIDEMENT OF LEFT 5TH TOE AND TRANSMETATARSAL, WITH PARTICAL LEFT 5TH TOE AND METATARSAL AMPUTATION, BONE BIOPSY, WOUND VAC APPLICATION.;  Surgeon: Park Liter, DPM;  Location: [facility] OR;  Service: Podiatry;  Laterality: N/A;   UPPER EXTREMITY VENOGRAPHY N/A 11/16/2016   Procedure: Upper Extremity Venography - Right Central;  Surgeon: Sherren Kerns, MD;  Location: [facility] INVASIVE CV LAB;  Service: Cardiovascular;  Laterality: N/A;   UPPER EXTREMITY VENOGRAPHY N/A 05/25/2018   Procedure: UPPER EXTREMITY VENOGRAPHY - Bilateral;  Surgeon: Cephus Shelling, MD;  Location: [facility] INVASIVE CV LAB;  Service: Cardiovascular;  Laterality: N/A;     A IV Location/Drains/Wounds Patient Lines/Drains/Airways Status  Active Line/Drains/Airways     Name Placement date Placement time Site Days   Peripheral IV 07/09/23 20 G Anterior;Left;Proximal;Upper Arm 07/09/23  2140  Arm  1   Peripheral IV 07/10/23 20 G 1.88" Right Forearm 07/10/23  0010  Forearm  less than 1   Fistula / Graft Left Upper arm Arteriovenous vein graft 05/26/18  0828  Upper arm  1871   Hemodialysis Catheter Left Subclavian Double lumen Permanent (Tunneled) 04/14/23  1045  Subclavian  87   Wound / Incision (Open or Dehisced) 04/10/23 Diabetic ulcer Foot Right;Posterior chronic diabetic foot ulcer 04/10/23  1300  Foot  91            Intake/Output Last 24 hours  Intake/Output Summary (Last 24 hours) at 07/10/2023 0122 Last data filed at 07/10/2023 0022 Gross per 24 hour  Intake 1000 ml  Output --  Net 1000 ml    Labs/Imaging Results for orders placed or performed during the hospital encounter of 07/09/23 (from the past 48 hour(s))  Comprehensive metabolic panel     Status: Abnormal    Collection Time: 07/09/23 10:31 PM  Result Value Ref Range   Sodium 125 (L) 135 - 145 mmol/L   Potassium 5.0 3.5 - 5.1 mmol/L   Chloride 85 (L) 98 - 111 mmol/L   CO2 <7 (L) 22 - 32 mmol/L   Glucose, Bld 497 (H) 70 - 99 mg/dL    Comment: Glucose reference range applies only to samples taken after fasting for at least 8 hours.   BUN 64 (H) 6 - 20 mg/dL   Creatinine, Ser 16.10 (H) 0.61 - 1.24 mg/dL   Calcium 9.5 8.9 - 96.0 mg/dL   Total Protein 7.0 6.5 - 8.1 g/dL   Albumin 2.8 (L) 3.5 - 5.0 g/dL   AST 33 15 - 41 U/L   ALT 13 0 - 44 U/L   Alkaline Phosphatase 178 (H) 38 - 126 U/L   Total Bilirubin 1.9 (H) 0.3 - 1.2 mg/dL   GFR, Estimated 3 (L) >60 mL/min    Comment: (NOTE) Calculated using the CKD-EPI Creatinine Equation (2021)    Anion gap NOT CALCULATED 5 - 15    Comment: ELECTROLYTES REPEATED TO VERIFY Performed at [facility], [address], [place], [name] [address]   CBC with Differential     Status: Abnormal   Collection Time: 07/09/23 10:31 PM  Result Value Ref Range   WBC 20.7 (H) 4.0 - 10.5 K/uL   RBC 3.62 (L) 4.22 - 5.81 MIL/uL   Hemoglobin 10.2 (L) 13.0 - 17.0 g/dL   HCT 81.1 (L) 91.4 - 78.2 %   MCV 92.0 80.0 - 100.0 fL   MCH 28.2 26.0 - 34.0 pg   MCHC 30.6 30.0 - 36.0 g/dL   RDW 95.6 21.3 - 08.6 %   Platelets 155 150 - 400 K/uL   nRBC 0.0 0.0 - 0.2 %   Neutrophils Relative % 90 %   Neutro Abs 18.6 (H) 1.7 - 7.7 K/uL   Lymphocytes Relative 1 %   Lymphs Abs 0.3 (L) 0.7 - 4.0 K/uL   Monocytes Relative 4 %   Monocytes Absolute 0.9 0.1 - 1.0 K/uL   Eosinophils Relative 1 %   Eosinophils Absolute 0.1 0.0 - 0.5 K/uL   Basophils Relative 0 %   Basophils Absolute 0.1 0.0 - 0.1 K/uL   Immature Granulocytes 4 %   Abs Immature Granulocytes 0.77 (H) 0.00 - 0.07 K/uL    Comment: Performed  at [facility], [address] [place], [name] [address]  TSH     Status: None   Collection Time: 07/09/23 10:31 PM  Result Value Ref Range   TSH 2.851 0.350 -  4.500 uIU/mL    Comment: Performed by a 3rd Generation assay with a functional sensitivity of <=0.01 uIU/mL. Performed at [facility], [address] [place], [name] [address]   Ammonia     Status: None   Collection Time: 07/09/23 10:31 PM  Result Value Ref Range   Ammonia 16 9 - 35 umol/L    Comment: Performed at [facility], [address] [place], [name] [address]  Magnesium     Status: None   Collection Time: 07/09/23 10:31 PM  Result Value Ref Range   Magnesium 2.4 1.7 - 2.4 mg/dL    Comment: Performed at [facility], [address] [place], [name] [address]  Beta-hydroxybutyric acid     Status: Abnormal   Collection Time: 07/09/23 11:21 PM  Result Value Ref Range   Beta-Hydroxybutyric Acid >8.00 (H) 0.05 - 0.27 mmol/L    Comment: RESULT CONFIRMED BY MANUAL DILUTION Performed at [facility], [address], [place], [name] [address]   I-Stat Lactic Acid     Status: Abnormal   Collection Time: 07/09/23 11:26 PM  Result Value Ref Range   Lactic Acid, Venous 2.4 (HH) 0.5 - 1.9 mmol/L   Comment NOTIFIED PHYSICIAN   I-stat chem 8, ED     Status: Abnormal   Collection Time: 07/10/23 12:14 AM  Result Value Ref Range   Sodium 122 (L) 135 - 145 mmol/L   Potassium 4.8 3.5 - 5.1 mmol/L   Chloride 95 (L) 98 - 111 mmol/L   BUN 57 (H) 6 - 20 mg/dL   Creatinine, Ser 69.48 (H) 0.61 - 1.24 mg/dL   Glucose, Bld 546 (HH) 70 - 99 mg/dL    Comment: Glucose reference range applies only to samples taken after fasting for at least 8 hours.   Calcium, Ion 1.20 1.15 - 1.40 mmol/L   TCO2 7 (L) 22 - 32 mmol/L   Hemoglobin 12.9 (L) 13.0 - 17.0 g/dL   HCT 27.0 (L) 35.0 - 09.3 %   Comment NOTIFIED PHYSICIAN   I-Stat venous blood gas, ED     Status: Abnormal   Collection Time: 07/10/23 12:14 AM  Result Value Ref Range   pH, Ven 7.064 (LL) 7.25 - 7.43   pCO2, Ven 16.6 (LL) 44 - 60 mmHg   pO2, Ven 54 (H) 32 - 45 mmHg   Bicarbonate 4.7 (L) 20.0 - 28.0 mmol/L    TCO2 5 (L) 22 - 32 mmol/L   O2 Saturation 74 %   Acid-base deficit 24.0 (H) 0.0 - 2.0 mmol/L   Sodium 121 (L) 135 - 145 mmol/L   Potassium 4.8 3.5 - 5.1 mmol/L   Calcium, Ion 1.19 1.15 - 1.40 mmol/L   HCT 37.0 (L) 39.0 - 52.0 %   Hemoglobin 12.6 (L) 13.0 - 17.0 g/dL   Sample type VENOUS    Comment NOTIFIED PHYSICIAN   CBG monitoring, ED     Status: Abnormal   Collection Time: 07/10/23  1:08 AM  Result Value Ref Range   Glucose-Capillary 482 (H) 70 - 99 mg/dL    Comment: Glucose reference range applies only to samples taken after fasting for at least 8 hours.   CT ABDOMEN PELVIS WO CONTRAST  Result Date: 07/09/2023  CLINICAL DATA:  Abdomen pain altered EXAM: CT ABDOMEN AND PELVIS WITHOUT CONTRAST TECHNIQUE: Multidetector CT imaging of the abdomen and pelvis was performed following the standard protocol without IV contrast. RADIATION DOSE REDUCTION: This exam was performed according to the departmental dose-optimization program which includes automated exposure control, adjustment of the mA and/or kV according to patient size and/or use of iterative reconstruction technique. COMPARISON:  CT 05/30/2020, ultrasound 04/10/2023 FINDINGS: Lower chest: Lung bases demonstrate cardiomegaly and coronary vascular calcification. Gynecomastia. No acute airspace disease. Hepatobiliary: Heterogenous somewhat geographic area of low density near falciform ligament, measures about 7 by 4 cm. Contracted gallbladder without calcified stone. No biliary dilatation Pancreas: Unremarkable. No pancreatic ductal dilatation or surrounding inflammatory changes. Spleen: Normal in size without focal abnormality. Adrenals/Urinary Tract: Adrenal glands are normal. Atrophic kidneys. No hydronephrosis. Intrarenal vascular calcifications. Slightly thick-walled urinary bladder. Stomach/Bowel: The stomach is nonenlarged. No dilated small bowel. No acute bowel wall thickening. Vascular/Lymphatic: Advanced vascular calcifications. No  aneurysm. No suspicious lymph nodes. Reproductive: Prostate is unremarkable. Other: Negative for pelvic effusion or free air. Musculoskeletal: No acute or suspicious osseous abnormality. Note that the left hip is incompletely included. IMPRESSION: 1. Negative for bowel obstruction or acute bowel wall thickening. 2. Atrophic kidneys. 3. Heterogenous somewhat geographic area of low density within the anterior liver near falciform ligament, measures about 7 x 4 cm. This is indeterminate for geographic steatosis versus liver mass. When the patient is clinically stable and able to follow directions and hold their breath (preferably as an outpatient) further evaluation with dedicated abdominal MRI should be considered. 4. Cardiomegaly. Electronically Signed   By: Jasmine Pang M.D.   On: 07/09/2023 23:53   CT Head Wo Contrast  Result Date: 07/09/2023 CLINICAL DATA:  Mental status change EXAM: CT HEAD WITHOUT CONTRAST TECHNIQUE: Contiguous axial images were obtained from the base of the skull through the vertex without intravenous contrast. RADIATION DOSE REDUCTION: This exam was performed according to the departmental dose-optimization program which includes automated exposure control, adjustment of the mA and/or kV according to patient size and/or use of iterative reconstruction technique. COMPARISON:  CT brain 04/17/2022 FINDINGS: Brain: Significant motion degradation limits the exam. No gross hemorrhage or mass. The ventricles are grossly stable in size Vascular: Carotid vascular calcification Skull: No gross fracture but motion degradation Sinuses/Orbits: Opacified left maxillary sinus Other: None IMPRESSION: 1. Significant motion degradation limits the exam. No gross acute intracranial abnormality. 2. Opacified left maxillary sinus. Electronically Signed   By: Jasmine Pang M.D.   On: 07/09/2023 23:45   DG Chest Portable 1 View  Result Date: 07/09/2023 CLINICAL DATA:  Shortness of breath EXAM: PORTABLE  CHEST 1 VIEW COMPARISON:  04/09/2023, 04/14/2023 FINDINGS: Low lung volumes. Cardiomegaly with central bronchovascular crowding. No pleural effusion or pneumothorax. Left-sided central venous catheter tip projects over the left axillary/subclavian region. Stent in the left upper arm. Tubular calcification in the region of the right jugular vein which may be due to chronic occlusion IMPRESSION: 1. The patient's left-sided dialysis catheter has been retracted, the tip now projects over the subclavian region. 2. Cardiomegaly with low lung volumes and bronchovascular crowding but no overt edema or pleural effusion Electronically Signed   By: Jasmine Pang M.D.   On: 07/09/2023 23:43    Pending Labs Unresulted Labs (From admission, onward)     Start     Ordered   07/10/23 0500  CBC  Tomorrow morning,   R        07/10/23 0119  07/10/23 0500  Basic metabolic panel  Tomorrow morning,   R        07/10/23 0119   07/10/23 0500  Magnesium  Tomorrow morning,   R        07/10/23 0119   07/10/23 0500  Phosphorus  Tomorrow morning,   R        07/10/23 0119   07/10/23 0121  Ethanol  Once,   R        07/10/23 0120   07/09/23 2330  SARS Coronavirus 2 by RT PCR (hospital order, performed in [facility] hospital lab) *cepheid single result test*  (SARS Coronavirus 2 by RT PCR (hospital order, performed in [facility] hospital lab) *cepheid single result test*)  Once,   URGENT        07/09/23 2329   07/09/23 2312  Culture, blood (routine x 2)  BLOOD CULTURE X 2,   R (with STAT occurrences)      07/09/23 2311            Vitals/Pain Today's Vitals   07/09/23 2215 07/09/23 2230 07/09/23 2240 07/09/23 2300  BP: 91/71 (!) 86/52 (!) 112/57   Pulse: 88 93 91 93  Resp: (!) 26 20 19 15   Temp:      TempSrc:      SpO2: 96% 100% 100% 95%  Weight:    77.6 kg    Isolation Precautions Airborne and Contact precautions  Medications Medications  vancomycin (VANCOREADY) IVPB 1750 mg/350 mL (1,750 mg Intravenous  New Bag/Given 07/10/23 0051)  insulin regular, human (MYXREDLIN) 100 units/ 100 mL infusion (10 Units/hr Intravenous New Bag/Given 07/10/23 0112)  dextrose 5 % in lactated ringers infusion (has no administration in time range)  dextrose 50 % solution 0-50 mL (has no administration in time range)  0.9 %  sodium chloride infusion ( Intravenous New Bag/Given 07/10/23 0052)  docusate sodium (COLACE) capsule 100 mg (has no administration in time range)  polyethylene glycol (MIRALAX / GLYCOLAX) packet 17 g (has no administration in time range)  heparin injection 5,000 Units (has no administration in time range)  famotidine (PEPCID) tablet 10 mg (has no administration in time range)  sodium chloride 0.9 % bolus 1,000 mL (0 mLs Intravenous Stopped 07/10/23 0022)  HYDROmorphone (DILAUDID) injection 0.5 mg (0.5 mg Intravenous Given 07/09/23 2313)  piperacillin-tazobactam (ZOSYN) IVPB 3.375 g (3.375 g Intravenous New Bag/Given 07/10/23 0052)    Mobility non-ambulatory     Focused Assessments    R Recommendations: See Admitting Provider Note  Report given to:   Additional Notes: Found on ground at home with dialysis port pulled out.  Pt altered; usually A/O x4.

## 2023-07-10 NOTE — Progress Notes (Signed)
eLink Physician-Brief Progress Note Patient Name: [name] DOB: [birth year] MRN: [id number]   Date of Service  07/10/2023  HPI/Events of Note  42 yo M w/ pertinent PMH ESRD on HD MWF, IDDM, gastroparesis, left AKA, HTN in ICU for 1) DKA. AMS. CTH neg.Nh3, TSH wnl.  2). Sepsis. source not clear. on abx. recent diarhea. 03/2023 right foot ulcer/MRSA bacteremia. 3) ESRD on HD  Camera: Discussed with RN. He is following simple commands, still encephalopathic. No fever. HR < 90. MAP > 65. Sats on room air good.   Data reviewed Covid neg   eICU Interventions  VTE on sq heparin. LFT ok. Abnormal scan- liver issue steatosis vs mass. Need further evaluation.       Intervention Category Major Interventions: Other: (DKA) Evaluation Type: New Patient Evaluation  [name] 07/10/2023, 3:14 AM

## 2023-07-10 NOTE — Progress Notes (Signed)
Pharmacy Antibiotic Note  [name] is a 42 y.o. male admitted on 07/09/2023 with sepsis, found down. PMH of ESRD on HD MWF, IDDM, gastroparesis, left AKA, HTN and was found altered by family member 10/11. Unknown if patient received dialysis on Friday, patient appears anuric at this time. WBC elevated on admission at 20.7, lactic >2. Pharmacy has been consulted for vanc/zosyn dosing. BCID call received for MRSA in 4 of 4 bottles.  Vancomycin 1750 mg (22.7 mg/kg) given 10/11 at 2330 Zosyn 3.375 g given 10/11 at 2330  Name of physician (or Provider) Contacted: Janeann Forehand, Dr. Wynona Neat  Changes to prescribed antibiotics recommended:  Patient is on recommended antibiotics - No changes needed  Results for orders placed or performed during the hospital encounter of 07/09/23  Blood Culture ID Panel (Reflexed) (Collected: 07/10/2023 12:15 AM)  Result Value Ref Range   Enterococcus faecalis NOT DETECTED NOT DETECTED   Enterococcus Faecium NOT DETECTED NOT DETECTED   Listeria monocytogenes NOT DETECTED NOT DETECTED   Staphylococcus species DETECTED (A) NOT DETECTED   Staphylococcus aureus (BCID) DETECTED (A) NOT DETECTED   Staphylococcus epidermidis NOT DETECTED NOT DETECTED   Staphylococcus lugdunensis NOT DETECTED NOT DETECTED   Streptococcus species NOT DETECTED NOT DETECTED   Streptococcus agalactiae NOT DETECTED NOT DETECTED   Streptococcus pneumoniae NOT DETECTED NOT DETECTED   Streptococcus pyogenes NOT DETECTED NOT DETECTED   A.calcoaceticus-baumannii NOT DETECTED NOT DETECTED   Bacteroides fragilis NOT DETECTED NOT DETECTED   Enterobacterales NOT DETECTED NOT DETECTED   Enterobacter cloacae complex NOT DETECTED NOT DETECTED   Escherichia coli NOT DETECTED NOT DETECTED   Klebsiella aerogenes NOT DETECTED NOT DETECTED   Klebsiella oxytoca NOT DETECTED NOT DETECTED   Klebsiella pneumoniae NOT DETECTED NOT DETECTED   Proteus species NOT DETECTED NOT DETECTED   Salmonella species  NOT DETECTED NOT DETECTED   Serratia marcescens NOT DETECTED NOT DETECTED   Haemophilus influenzae NOT DETECTED NOT DETECTED   Neisseria meningitidis NOT DETECTED NOT DETECTED   Pseudomonas aeruginosa NOT DETECTED NOT DETECTED   Stenotrophomonas maltophilia NOT DETECTED NOT DETECTED   Candida albicans NOT DETECTED NOT DETECTED   Candida auris NOT DETECTED NOT DETECTED   Candida glabrata NOT DETECTED NOT DETECTED   Candida krusei NOT DETECTED NOT DETECTED   Candida parapsilosis NOT DETECTED NOT DETECTED   Candida tropicalis NOT DETECTED NOT DETECTED   Cryptococcus neoformans/gattii NOT DETECTED NOT DETECTED   Meth resistant mecA/C and MREJ DETECTED (A) NOT DETECTED   Weight: 77 kg (169 lb 12.1 oz)  Temp (24hrs), Avg:98 F (36.7 C), Min:97.5 F (36.4 C), Max:98.8 F (37.1 C)  Recent Labs  Lab 07/09/23 2231 07/09/23 2326 07/10/23 0014 07/10/23 0142 07/10/23 0551  WBC 20.7*  --   --  20.5*  --   CREATININE 16.48*  --  16.50* 15.81* 15.93*  LATICACIDVEN  --  2.4*  --  1.9 2.2*    Estimated Creatinine Clearance: 5.6 mL/min (A) (by C-G formula based on SCr of 15.93 mg/dL (H)).    No Known Allergies  Plan: Zosyn 2.25 g for CrCl <10/HD dosing Vancomycin 750 mg post HD Monitor level after 2nd HD session F/u culture data for antibiotic regimen, continue until finalized  Microbiology results: 10/12 BCx: MRSA 10/12 MRSA PCR: positive 10/12 Cdiff PCR positive, toxin negative  Thank you for allowing pharmacy to be a part of this patient's care.  Rutherford Nail, PharmD PGY2 Critical Care Pharmacy Resident 07/10/2023 9:08 AM

## 2023-07-10 NOTE — Progress Notes (Signed)
eLink Physician-Brief Progress Note Patient Name: [name] DOB: [birth year] MRN: [id number]   Date of Service  07/10/2023  HPI/Events of Note  Patient febrile to 101.7 F.  Has p.o. Tylenol ordered but unable to take p.o.  eICU Interventions  Switched Tylenol route to rectal.     Intervention Category Evaluation Type: Other  Carilyn Goodpasture 07/10/2023, 11:55 PM

## 2023-07-10 NOTE — Consult Note (Addendum)
Renal Service Consult Note [facility]  [name] 07/10/2023 Shane Krabbe, MD Requesting Physician: Dr. Lonzo Candy   Reason for Consult: ESRD pt here w/ DKA HPI: The patient is a 42 y.o. year-old w/ PMH as below who presented 10/11 after being found on the floor at home by his sister. Having diarrhea at home x 1 week. Last HD was Wednesday. His HD cath was partially dislodged. Missed HD Friday. In ED BP 107/70, afebrile, pt was confsued. CBG 497, beta H > 8, VBG 7.06/ 16/ 54.  CT abd/ pelv no acute findings. WBC 20K. Sepsis protocol initiated, cx's obtained and started on IV vanc/ zosyn. BP's soft. CCM consulted for admission. IV insulin started w/ dka protocol. We are asked to see for dialysis.   Pt seen in ICU. Pt is still confused, lethargic, moans and rolling from side to side. No specific c/o's. Does not respond to questions.   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 Filetted toe flap metatarsal resection. Debridement Plantar Foot wound;  Surgeon: Park Liter, DPM;  Location: [facility] OR;  Service: Podiatry;  Laterality: Right;   AMPUTATION Left 08/20/2018   Procedure: FIFTH METATARSAL BONE BIOPSY;  Surgeon: Park Liter, DPM;  Location: [facility] OR;  Service: Podiatry;  Laterality: Left;   AMPUTATION Left 10/28/2018   Procedure: LEFT  GREAT TOE AMPUTATION;  Surgeon: Park Liter, DPM;  Location: [facility] OR;  Service: Podiatry;  Laterality: Left;   AMPUTATION Left 09/16/2020   Procedure: AMPUTATION BELOW KNEE;  Surgeon: Larina Earthly, MD;  Location: [facility] OR;  Service: Vascular;  Laterality: Left;   AMPUTATION Left 10/15/2020   Procedure: AMPUTATION ABOVE KNEE;  Surgeon: Sherren Kerns, MD;  Location: [facility] OR;  Service: Vascular;  Laterality: Left;   APPLICATION OF WOUND VAC  02/02/2018   Procedure: APPLICATION OF WOUND VAC  Right Foot;  Surgeon: Park Liter, DPM;  Location: [facility] OR;  Service: Podiatry;;   APPLICATION OF WOUND VAC Left 10/28/2018   Procedure: APPLICATION OF WOUND VAC LEFT TOE;  Surgeon: Park Liter, DPM;  Location: [facility] OR;  Service: Podiatry;  Laterality: Left;   APPLICATION OF WOUND VAC Left 11/01/2018   Procedure: APPLICATION OF WOUND VAC;  Surgeon: Park Liter, DPM;  Location: [facility] OR;  Service: Podiatry;  Laterality: Left;   AV FISTULA PLACEMENT     left arm.   AV FISTULA PLACEMENT Right 12/22/2016   Procedure: INSERTION OF ARTERIOVENOUS (AV) GORE-TEX GRAFT ARM;  Surgeon: Sherren Kerns, MD;  Location: [facility] OR;  Service: Vascular;  Laterality: Right;   AV FISTULA PLACEMENT Left 05/26/2018   Procedure: INSERTION OF  ARTERIOVENOUS (AV) GORE-TEX GRAFT LEFT ARM;  Surgeon: Nada Libman, MD;  Location: [facility] OR;  Service: Vascular;  Laterality: Left;   EYE SURGERY     I & D EXTREMITY Right 10/31/2017   Procedure: IRRIGATION AND DEBRIDEMENT RIGHT FOOT;  Surgeon: Park Liter, DPM;  Location: [facility] OR;  Service: Podiatry;  Laterality: Right;   I & D EXTREMITY Left 08/20/2018   Procedure: IRRIGATION AND DEBRIDEMENT EXTREMITY WITH SECONDARY WOUND CLOSUREAND APPLICATION OF WOUND VAC LEFT FOOT;  Surgeon: Park Liter, DPM;  Location: [facility] OR;  Service: Podiatry;  Laterality: Left;   I & D EXTREMITY Left 10/20/2018   Procedure: IRRIGATION AND DEBRIDEMENT LEFT FOOT  DEBRIDEMENT LATERAL FOOT WOUND;  Surgeon: Park Liter, DPM;  Location: [facility] OR;  Service: Podiatry;  Laterality: Left;   I & D EXTREMITY Left 10/28/2018   Procedure: IRRIGATION AND DEBRIDEMENT LEFT TOE;  Surgeon: Park Liter, DPM;  Location: [facility] OR;  Service: Podiatry;  Laterality: Left;   I & D EXTREMITY Left 09/14/2020   Procedure: IRRIGATION AND DEBRIDEMENT WRIST;  Surgeon: Betha Loa, MD;  Location: [facility] OR;  Service: Orthopedics;  Laterality: Left;   INSERTION OF DIALYSIS CATHETER     Right subclavian   IR AV DIALY SHUNT INTRO NEEDLE/INTRACATH INITIAL W/PTA/IMG RIGHT Right 02/05/2018   IR FLUORO GUIDE CV LINE LEFT  04/14/2023   IR FLUORO GUIDE CV LINE RIGHT  01/31/2020   IR FLUORO GUIDE CV LINE RIGHT  01/30/2022   IR PTA VENOUS EXCEPT DIALYSIS CIRCUIT  01/30/2022   IR REMOVAL TUN CV CATH W/O FL  04/12/2023   IR THROMBECTOMY AV FISTULA W/THROMBOLYSIS/PTA INC/SHUNT/IMG LEFT Left 08/24/2018   IR THROMBECTOMY AV FISTULA W/THROMBOLYSIS/PTA INC/SHUNT/IMG LEFT Left 01/06/2019   IR US GUIDE VASC ACCESS LEFT  08/24/2018   IR US GUIDE VASC ACCESS LEFT  04/14/2023   IR US GUIDE VASC ACCESS RIGHT  02/05/2018   IR US GUIDE VASC ACCESS RIGHT  01/31/2020   IR VENOCAVAGRAM SVC  01/30/2022   IRRIGATION AND DEBRIDEMENT FOOT Right 10/23/2018   Procedure: Irrigation and Debridement to tendon, Left Foot;  Surgeon: Park Liter, DPM;  Location: [facility] OR;  Service: Podiatry;  Laterality: Right;   IRRIGATION AND DEBRIDEMENT FOOT Left 11/01/2018   Procedure: IRRIGATION AND DEBRIDEMENT PARTIAL WOUND CLOSURE LOCAL TISSUE TRANSFER AND FLAP ROTATION, LEFT FOOT;  Surgeon: Park Liter, DPM;  Location: [facility] OR;  Service: Podiatry;  Laterality: Left;   TRANSMETATARSAL AMPUTATION N/A 08/18/2018   Procedure: IRRIGATION AND DEBRIDEMENT OF LEFT 5TH TOE AND TRANSMETATARSAL, WITH PARTICAL LEFT 5TH TOE AND METATARSAL AMPUTATION, BONE BIOPSY, WOUND VAC APPLICATION.;  Surgeon: Park Liter, DPM;  Location: [facility] OR;  Service: Podiatry;  Laterality: N/A;   UPPER EXTREMITY VENOGRAPHY  N/A 11/16/2016   Procedure: Upper Extremity Venography - Right Central;  Surgeon: Sherren Kerns, MD;  Location: [facility] INVASIVE CV LAB;  Service: Cardiovascular;  Laterality: N/A;   UPPER EXTREMITY VENOGRAPHY N/A 05/25/2018   Procedure: UPPER EXTREMITY VENOGRAPHY - Bilateral;  Surgeon: Cephus Shelling, MD;  Location: [facility] INVASIVE CV LAB;  Service: Cardiovascular;  Laterality: N/A;   Family History  Family History  Problem Relation Age of Onset   Diabetes Mellitus II Other    Diabetes Father    Renal Disease Father        ESRD   Social History  reports that he has never smoked. He has never used smokeless tobacco. He reports that he does not drink alcohol and does not use drugs. Allergies No Known Allergies Home medications Prior to Admission medications   Medication Sig Start Date  End Date Taking? Authorizing Provider  calcitRIOL (ROCALTROL) 0.5 MCG capsule Take 3 capsules (1.5 mcg total) by mouth every Monday, Wednesday, and Friday with hemodialysis. 05/23/21  Yes Kathlen Mody, MD  cinacalcet (SENSIPAR) 60 MG tablet Take 120 mg by mouth every evening. 04/10/22  Yes [provider]  gabapentin (NEURONTIN) 100 MG capsule TAKE 1 CAPSULE BY MOUTH THREE TIMES DAILY 07/05/23  Yes [name], NP  multivitamin (RENA-VIT) TABS tablet Take 1 tablet by mouth at bedtime. 05/23/21  Yes Kathlen Mody, MD  NOVOLIN R 100 UNIT/ML injection INJECT 12 UNITS SUBCUTANEOUSLY THREE TIMES DAILY BEFORE MEAL(S) 07/05/23  Yes [name], NP  sevelamer carbonate (RENVELA) 800 MG tablet Take 2,400 mg by mouth 3 (three) times daily with meals. 04/27/19  Yes [provider]  glucose blood (ONETOUCH VERIO) test strip USE 1 STRIP TO CHECK GLUCOSE THREE TIMES DAILY AS DIRECTED 05/12/23   [name], NP     Vitals:   07/10/23 0800 07/10/23 0900 07/10/23 1000 07/10/23 1212  BP: (!) 119/57 (!) 93/56 (!) 109/59   Pulse: 86 86 88   Resp: 13 12 17    Temp:    (!) 97 F (36.1 C)   TempSrc:    Axillary  SpO2: 97% (!) 87% (!) 89%   Weight:       Exam Gen alert, no distress, confused, moaning No rash, cyanosis or gangrene Sclera anicteric, throat clear  No jvd or bruits Chest clear bilat to bases, no rales/ wheezing RRR no RG Abd soft ntnd no mass or ascites +bs GU normal male MS no joint effusions or deformity Ext no pitting edema UE's or LE's, puffy in the face  Neuro is lethargic and moaning, moves all ext, not following commands    [facility] TDC --> dislodged yesterday      Renal-related home meds: - sensipar 120 at bedtime - gabapentin 100mg  - renvavite - renvela 3 ac tid    OP HD: MWF [facility] 4h  400/1.5    77kg   2/3 bath  [facility] TDC    Heparin none - last OP HD 10/7, post wt 74.6kg - coming off 75- 76kg - misses HD once every other week approx - rocaltrol 1.50 mcg  - no esa    Assessment/ Plan: DKA - on insulin drip, per CCM AMS - likely DKA and/or sepsis, getting empiric IV abx Sepsis - had diarrhea this week, hx of MRSA bacteremia in 03/2023. On vanc/ zosyn, cx's pending.  ESRD - on HD MWF. Last HD Monday, missed Wed HD. Plan is HD later tonight (after access re-established).  HD access - the [facility] was dislodged and is now out. This could happen due to tunnel infection. Bcx's pending. CCM to place cath at bedside.  HTN/ BP - BP's are soft in the 90s- 110 range.  Volume - no vol excess, at his dry wt. UF 1 L max w/ HD.  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 No esa at OP unit. Follow.  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 Resume po vdra mwf. Resume binders when eating.       Vinson Moselle  MD [facility] 07/10/2023, 1:07 PM  Recent Labs  Lab 07/09/23 2231 07/10/23 0014 07/10/23 0142 07/10/23 0551 07/10/23 0901  HGB 10.2* 12.9*  12.6* 10.0*  --   --   ALBUMIN 2.8*  --  2.6*  --   --   CALCIUM 9.5  --  9.0 9.1 9.1  PHOS  --   --  7.4*  --   --   CREATININE 16.48* 16.50* 15.81* 15.93* 15.12*  K 5.0 4.8  4.8 4.8 4.2 4.0   Inpatient medications:   Chlorhexidine Gluconate Cloth  6 each Topical Daily   famotidine  10 mg Oral Daily   heparin  5,000 Units Subcutaneous Q8H   mupirocin ointment  1 Application Nasal BID    dextrose 5% lactated ringers 125 mL/hr at 07/10/23 1200   insulin 1.9 Units/hr (07/10/23 1200)   piperacillin-tazobactam (ZOSYN)  IV Stopped (07/10/23 1013)   acetaminophen, dextrose, docusate sodium, mouth rinse, polyethylene glycol

## 2023-07-11 ENCOUNTER — Inpatient Hospital Stay (HOSPITAL_COMMUNITY): Payer: Medicare HMO

## 2023-07-11 DIAGNOSIS — E1310 Other specified diabetes mellitus with ketoacidosis without coma: Secondary | ICD-10-CM

## 2023-07-11 DIAGNOSIS — R7881 Bacteremia: Secondary | ICD-10-CM | POA: Diagnosis not present

## 2023-07-11 LAB — GLUCOSE, CAPILLARY
Glucose-Capillary: 101 mg/dL — ABNORMAL HIGH (ref 70–99)
Glucose-Capillary: 102 mg/dL — ABNORMAL HIGH (ref 70–99)
Glucose-Capillary: 104 mg/dL — ABNORMAL HIGH (ref 70–99)
Glucose-Capillary: 106 mg/dL — ABNORMAL HIGH (ref 70–99)
Glucose-Capillary: 110 mg/dL — ABNORMAL HIGH (ref 70–99)
Glucose-Capillary: 116 mg/dL — ABNORMAL HIGH (ref 70–99)
Glucose-Capillary: 138 mg/dL — ABNORMAL HIGH (ref 70–99)
Glucose-Capillary: 139 mg/dL — ABNORMAL HIGH (ref 70–99)
Glucose-Capillary: 142 mg/dL — ABNORMAL HIGH (ref 70–99)
Glucose-Capillary: 148 mg/dL — ABNORMAL HIGH (ref 70–99)
Glucose-Capillary: 157 mg/dL — ABNORMAL HIGH (ref 70–99)
Glucose-Capillary: 209 mg/dL — ABNORMAL HIGH (ref 70–99)
Glucose-Capillary: 268 mg/dL — ABNORMAL HIGH (ref 70–99)

## 2023-07-11 LAB — BASIC METABOLIC PANEL
Anion gap: 18 — ABNORMAL HIGH (ref 5–15)
Anion gap: 16 — ABNORMAL HIGH (ref 5–15)
Anion gap: 18 — ABNORMAL HIGH (ref 5–15)
BUN: 69 mg/dL — ABNORMAL HIGH (ref 6–20)
BUN: 73 mg/dL — ABNORMAL HIGH (ref 6–20)
BUN: 77 mg/dL — ABNORMAL HIGH (ref 6–20)
CO2: 15 mmol/L — ABNORMAL LOW (ref 22–32)
CO2: 15 mmol/L — ABNORMAL LOW (ref 22–32)
CO2: 16 mmol/L — ABNORMAL LOW (ref 22–32)
Calcium: 8.9 mg/dL (ref 8.9–10.3)
Calcium: 9 mg/dL (ref 8.9–10.3)
Calcium: 9.2 mg/dL (ref 8.9–10.3)
Chloride: 96 mmol/L — ABNORMAL LOW (ref 98–111)
Chloride: 97 mmol/L — ABNORMAL LOW (ref 98–111)
Chloride: 98 mmol/L (ref 98–111)
Creatinine, Ser: 15.24 mg/dL — ABNORMAL HIGH (ref 0.61–1.24)
Creatinine, Ser: 15.34 mg/dL — ABNORMAL HIGH (ref 0.61–1.24)
Creatinine, Ser: 15.58 mg/dL — ABNORMAL HIGH (ref 0.61–1.24)
GFR, Estimated: 4 mL/min — ABNORMAL LOW (ref >60)
GFR, Estimated: 4 mL/min — ABNORMAL LOW (ref >60)
GFR, Estimated: 4 mL/min — ABNORMAL LOW (ref >60)
Glucose, Bld: 120 mg/dL — ABNORMAL HIGH (ref 70–99)
Glucose, Bld: 105 mg/dL — ABNORMAL HIGH (ref 70–99)
Glucose, Bld: 163 mg/dL — ABNORMAL HIGH (ref 70–99)
Potassium: 3.8 mmol/L (ref 3.5–5.1)
Potassium: 4 mmol/L (ref 3.5–5.1)
Potassium: 4.2 mmol/L (ref 3.5–5.1)
Sodium: 129 mmol/L — ABNORMAL LOW (ref 135–145)
Sodium: 128 mmol/L — ABNORMAL LOW (ref 135–145)
Sodium: 132 mmol/L — ABNORMAL LOW (ref 135–145)

## 2023-07-11 LAB — CBC
HCT: 31.4 % — ABNORMAL LOW (ref 39.0–52.0)
Hemoglobin: 10.2 g/dL — ABNORMAL LOW (ref 13.0–17.0)
MCH: 27.6 pg (ref 26.0–34.0)
MCHC: 32.5 g/dL (ref 30.0–36.0)
MCV: 84.9 fL (ref 80.0–100.0)
Platelets: 106 10*3/uL — ABNORMAL LOW (ref 150–400)
RBC: 3.7 MIL/uL — ABNORMAL LOW (ref 4.22–5.81)
RDW: 13.5 % (ref 11.5–15.5)
WBC: 16 10*3/uL — ABNORMAL HIGH (ref 4.0–10.5)
nRBC: 0 % (ref 0.0–0.2)

## 2023-07-11 LAB — ECHOCARDIOGRAM COMPLETE
Area-P 1/2: 2.44 cm2
S' Lateral: 4.2 cm
Weight: 2716.07 [oz_av]

## 2023-07-11 MED ORDER — PERFLUTREN LIPID MICROSPHERE
1.0000 mL | INTRAVENOUS | Status: AC | PRN
  Administered 2023-07-11: 4 mL via INTRAVENOUS

## 2023-07-11 MED ORDER — CHLORHEXIDINE GLUCONATE CLOTH 2 % EX PADS
6.0000 | MEDICATED_PAD | Freq: Every day | CUTANEOUS | Status: DC
  Administered 2023-07-11 – 2023-07-18 (×7): 6 via TOPICAL

## 2023-07-11 MED ORDER — PROMETHAZINE HCL 6.25 MG/5ML PO SOLN: 12.5000 mg | Freq: Three times a day (TID) | ORAL | Status: DC | PRN

## 2023-07-11 MED ORDER — VANCOMYCIN HCL 750 MG/150ML IV SOLN
750.0000 mg | Freq: Once | INTRAVENOUS | Status: AC
  Administered 2023-07-12: 750 mg via INTRAVENOUS
  Filled 2023-07-11: qty 150

## 2023-07-11 MED ORDER — ACETAMINOPHEN 325 MG PO TABS
650.0000 mg | ORAL_TABLET | Freq: Four times a day (QID) | ORAL | Status: DC | PRN
  Administered 2023-07-11 – 2023-07-17 (×4): 650 mg via ORAL
  Filled 2023-07-11 (×5): qty 2

## 2023-07-11 MED ORDER — VANCOMYCIN HCL 750 MG/150ML IV SOLN
750.0000 mg | Freq: Once | INTRAVENOUS | Status: DC
  Filled 2023-07-11: qty 150

## 2023-07-11 MED ORDER — INSULIN ASPART 100 UNIT/ML IJ SOLN
0.0000 [IU] | Freq: Every day | INTRAMUSCULAR | Status: DC
  Administered 2023-07-11: 2 [IU] via SUBCUTANEOUS

## 2023-07-11 MED ORDER — GABAPENTIN 100 MG PO CAPS
100.0000 mg | ORAL_CAPSULE | Freq: Three times a day (TID) | ORAL | Status: DC
  Administered 2023-07-11 – 2023-07-18 (×19): 100 mg via ORAL
  Filled 2023-07-11 (×20): qty 1

## 2023-07-11 MED ORDER — GLUCERNA SHAKE PO LIQD
237.0000 mL | Freq: Three times a day (TID) | ORAL | Status: DC
  Administered 2023-07-11 – 2023-07-18 (×10): 237 mL via ORAL

## 2023-07-11 MED ORDER — PROMETHAZINE HCL 12.5 MG PO TABS
12.5000 mg | ORAL_TABLET | Freq: Three times a day (TID) | ORAL | Status: DC | PRN
  Administered 2023-07-14 – 2023-07-17 (×5): 12.5 mg via ORAL
  Filled 2023-07-11 (×5): qty 1

## 2023-07-11 MED ORDER — INSULIN GLARGINE-YFGN 100 UNIT/ML ~~LOC~~ SOLN
10.0000 [IU] | Freq: Two times a day (BID) | SUBCUTANEOUS | Status: DC
  Administered 2023-07-11 – 2023-07-12 (×4): 10 [IU] via SUBCUTANEOUS
  Filled 2023-07-11 (×7): qty 0.1

## 2023-07-11 MED ORDER — INSULIN ASPART 100 UNIT/ML IJ SOLN
0.0000 [IU] | Freq: Three times a day (TID) | INTRAMUSCULAR | Status: DC
  Administered 2023-07-11: 3 [IU] via SUBCUTANEOUS
  Administered 2023-07-15: 4 [IU] via SUBCUTANEOUS
  Administered 2023-07-15 (×2): 5 [IU] via SUBCUTANEOUS
  Administered 2023-07-16: 2 [IU] via SUBCUTANEOUS
  Administered 2023-07-16 (×2): 3 [IU] via SUBCUTANEOUS
  Administered 2023-07-17 (×2): 1 [IU] via SUBCUTANEOUS
  Administered 2023-07-18: 2 [IU] via SUBCUTANEOUS
  Administered 2023-07-18: 6 [IU] via SUBCUTANEOUS

## 2023-07-11 NOTE — Progress Notes (Signed)
Echocardiogram 2D Echocardiogram has been performed.  Warren Lacy Bee Hammerschmidt RDCS 07/11/2023, 9:59 AM

## 2023-07-11 NOTE — Progress Notes (Signed)
[facility] Progress Note  Subjective: seen in ICU, remains confused. Bcx's + for MRSA. Getting IV abx  Vitals:   07/11/23 0600 07/11/23 0630 07/11/23 0700 07/11/23 0804  BP:   98/72   Pulse: 70 69 69   Resp: (!) 8 (!) 7 15   Temp:    (!) 96.5 F (35.8 C)  TempSrc:    Axillary  SpO2: 97% 100% 100%   Weight:        Exam: Gen alert, no distress, confused No jvd or bruits Chest clear bilat to bases RRR no RG Abd soft ntnd no mass or ascites +bs Ext no pitting edema UE's or LE's Neuro is lethargic and moaning, moves all ext, not following commands    [facility] TDC --> dislodged yesterday    Temp cath R femoral vein      Renal-related home meds: - sensipar 120 at bedtime - gabapentin 100mg  - renvavite - renvela 3 ac tid      OP HD: MWF [facility] 4h  400/1.5    77kg   2/3 bath  [facility] TDC    Heparin none - last OP HD 10/7, post wt 74.6kg - coming off 75- 76kg - misses HD once every other week approx - rocaltrol 1.50 mcg  - no esa    CXR 10/12- no edema, no active disease   Assessment/ Plan: DKA - on insulin drip, BS's down in the 100s this am. Per CCM AMS - likely DKA and/or sepsis. Getting empiric IV abx and IV insulin for dka Sepsis - hx of MRSA bacteremia in 03/2023. On vanc/ zosyn. BCx's + for MRSA.  ESRD - on HD MWF. Last HD Monday, missed Wed HD. Did not dialyze last night due to staffing issues. Plan iHD in ICU today.  HD access - the [facility] was dislodged and is now out. Suspect this was a tunnel infection. Has temp cath R femoral. Will plan line holiday and TDC replacement later this week.   HTN/ BP - BP's are soft in the 90s- 110 range.  Volume - no vol excess, at his dry wt. CXR clear. 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 No esa at OP unit. Follow.  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 Resume po vdra mwf. Resume binders when eating.       Vinson Moselle MD  [facility] 07/11/2023, 8:46 AM  Recent Labs  Lab 07/09/23 2231 07/10/23 0014 07/10/23 0142  07/10/23 0551 07/11/23 0234 07/11/23 0547  HGB 10.2*   < > 10.0*  --   --  10.2*  ALBUMIN 2.8*  --  2.6*  --   --   --   CALCIUM 9.5  --  9.0   < > 8.9 9.0  PHOS  --   --  7.4*  --   --   --   CREATININE 16.48*   < > 15.81*   < > 15.24* 15.34*  K 5.0   < > 4.8   < > 3.8 4.0   < > = values in this interval not displayed.   No results for input(s): "IRON", "TIBC", "FERRITIN" in the last 168 hours. Inpatient medications:  Chlorhexidine Gluconate Cloth  6 each Topical Daily   Chlorhexidine Gluconate Cloth  6 each Topical Q0600   famotidine  10 mg Oral Daily   heparin  5,000 Units Subcutaneous Q8H   mupirocin ointment  1 Application Nasal BID   vancomycin variable dose per unstable  renal function (pharmacist dosing)   Does not apply See admin instructions    dexmedetomidine (PRECEDEX) IV infusion 0.1 mcg/kg/hr (07/11/23 0600)   insulin 0.3 Units/hr (07/11/23 0600)   vancomycin     acetaminophen, dextrose, docusate sodium, heparin sodium (porcine), mouth rinse, polyethylene glycol, vancomycin

## 2023-07-11 NOTE — Progress Notes (Signed)
NAME:  [name], MRN:  [id number], DOB:  1981/01/15, LOS: 1 ADMISSION DATE:  07/09/2023, CONSULTATION DATE: 07/10/2023 REFERRING MD:  Dr Suezanne Jacquet, CHIEF COMPLAINT: DKA, altered mental status  History of Present Illness:  Patient is a 42 yo M w/ pertinent PMH ESRD on HD MWF, IDDM, gastroparesis, left AKA, HTN presents to [facility] on 10/11 w/ AMS.   Patient recently admitted to [facility] on 7/12 sepsis from right diabetic foot ulcer. Cultures w/ MRSA. Patient had dialysis catheter removed and replaced. ID treated w/ vanc. Patient refused echo and left on 7/17 against medical advise.    On 10/11 patient was found by family member on ground altered. EMS arrived and dialysis catheter partially removed. Patient more alert for them. Has been complaining of diarrhea x1 week per family. Unsure if patient went to dialysis today. Transferred to [facility] ED. On arrival bp stable 107/70 and afebrile. Patient remains confused and occasionally with agitation. Given dilaudid. CBG 497. Beta H >8. VBG ph 7.06, 16, 54, 4.7. Given iv fluids and started on insulin drip for DKA. CT head negative for acute abnormality. Cxr no significant findings. CT abd/pelvis no acute findings; possible steatosis vs. Liver mass 7 x 4 cm recommending f/u w/ MRI. WBC 20.7 and LA 2.4. Sepsis protocol initiated. Cultures obtained and started on vanc/zosyn. Given ams, dka, and soft bp pccm consulted for icu admission.  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 rate 15, weight 77 kg, SpO2 100%.        Intake/Output Summary (Last 24 hours) at 07/11/2023 0809 Last data filed at 07/11/2023 0600 Gross per 24 hour  Intake 2256.61 ml  Output --  Net 2256.61 ml   Filed Weights   07/09/23 2300 07/10/23 0312 07/11/23 0500  Weight: 77.6 kg 77 kg 77 kg    Examination: General: Acute on chronically ill-appearing HENT: Moist oral mucosa Lungs: Clear breath sounds Cardiovascular: S1-S2 appreciated Abdomen: Soft, bowel sounds appreciated Extremities: No clubbing, no edema, AKA left Neuro: Arousable, not following commands, moves all extremities GU:   I reviewed nursing notes, Consultant notes,last 24 h vitals and pain scores, last 48 h intake and output, last 24 h labs and trends, and last 24 h imaging results.  Resolved Hospital Problem list     Assessment & Plan:  Diabetic ketoacidosis -Continue hydration -Insulin drip -Trend electrolytes -Anion gap remains elevated at 16, bicarb of 15  MRSA bacteremia -Continue vancomycin  Acute encephalopathy -Likely in the setting of DKA, sepsis -Ammonia level within normal limits -Continue neuroprotective measures -Aspiration precautions  Sepsis -Continue current antibiotic therapy  End-stage renal disease on  hemodialysis -Malpositioned catheter -Femoral catheter placed -Avoid nephrotoxic agents  Diarrhea -C. difficile positive, PCR negative, toxin negative  Patient had refused echocardiogram during last admission -Ordered and pending   Best Practice (right click and "Reselect all SmartList Selections" daily)   Diet/type: NPO DVT  prophylaxis: prophylactic heparin  GI prophylaxis: N/A Lines: Dialysis Catheter Foley:  N/A Code Status:  full code Last date of multidisciplinary goals of care discussion [mother updated 10/12]  Labs   CBC: Recent Labs  Lab 07/09/23 2231 07/10/23 0014 07/10/23 0142 07/11/23 0547  WBC 20.7*  --  20.5* 16.0*  NEUTROABS 18.6*  --   --   --   HGB 10.2* 12.9*  12.6* 10.0* 10.2*  HCT 33.3* 38.0*  37.0* 33.7* 31.4*  MCV 92.0  --  95.7 84.9  PLT 155  --  153 106*    Basic Metabolic Panel: Recent Labs  Lab 07/09/23 2231 07/10/23 0014 07/10/23 0142 07/10/23 0551 07/10/23 1309 07/10/23 1744 07/10/23 2212 07/11/23 0234 07/11/23 0547  NA 125*   < > 126*   < > 129* 130* 129* 129* 128*  K 5.0   < > 4.8   < > 4.3 4.0 4.3 3.8 4.0  CL 85*   < > 85*   < > 95* 93* 96* 96* 97*  CO2 <7*  --  <7*   < > 17* 17* 15* 15* 15*  GLUCOSE 497*   < > 542*   < > 100* 123* 141* 120* 105*  BUN 64*   < > 65*   < > 70* 70* 68* 69* 73*  CREATININE 16.48*   < > 15.81*   < > 15.22* 15.43* 15.19* 15.24* 15.34*  CALCIUM 9.5  --  9.0   < > 8.8* 9.3 8.7* 8.9 9.0  MG 2.4  --  2.5*  --   --   --   --   --   --   PHOS  --   --  7.4*  --   --   --   --   --   --    < > = values in this interval not displayed.   GFR: Estimated Creatinine Clearance: 5.8 mL/min (A) (by C-G formula based on SCr of 15.34 mg/dL (H)). Recent Labs  Lab 07/09/23 2231 07/09/23 2326 07/10/23 0142 07/10/23 0551 07/10/23 0901 07/11/23 0547  PROCALCITON  --   --  >150.00  --   --   --   WBC 20.7*  --  20.5*  --   --  16.0*  LATICACIDVEN  --  2.4* 1.9 2.2* 2.3*  --     Liver Function Tests: Recent Labs  Lab 07/09/23 2231 07/10/23 0142  AST 33 29  ALT 13 16  ALKPHOS 178* 176*  BILITOT 1.9* 2.2*  PROT 7.0 6.9  ALBUMIN 2.8* 2.6*   No results for input(s): "LIPASE", "AMYLASE" in the last 168 hours. Recent Labs  Lab 07/09/23 2231  AMMONIA 16    ABG    Component Value Date/Time   HCO3 4.7 (L) 07/10/2023 0014    TCO2 7 (L) 07/10/2023 0014   TCO2 5 (L) 07/10/2023 0014   ACIDBASEDEF 24.0 (H) 07/10/2023 0014   O2SAT 74 07/10/2023 0014     Coagulation Profile: No results for input(s): "INR", "PROTIME" in the last 168 hours.  Cardiac Enzymes: No results for input(s): "CKTOTAL", "CKMB", "CKMBINDEX", "TROPONINI" in the last 168 hours.  HbA1C: Hemoglobin A1C  Date/Time Value Ref Range Status  11/11/2022 12:00  AM 9.5  Final  08/22/2014 06:28 AM 5.8 4.2 - 6.3 % Final    Comment:    The American Diabetes Association recommends that a primary goal of therapy should be <7% and that physicians should reevaluate the treatment regimen in patients with HbA1c values consistently >8%.    HbA1c, POC (controlled diabetic range)  Date/Time Value Ref Range Status  06/25/2022 02:02 PM 10.6 (A) 0.0 - 7.0 % Final   Hgb A1c MFr Bld  Date/Time Value Ref Range Status  07/10/2023 01:42 AM 8.9 (H) 4.8 - 5.6 % Final    Comment:    (NOTE) Pre diabetes:          5.7%-6.4%  Diabetes:              >6.4%  Glycemic control for   <7.0% adults with diabetes   04/12/2023 03:00 PM 8.7 (H) 4.8 - 5.6 % Final    Comment:    (NOTE) Pre diabetes:          5.7%-6.4%  Diabetes:              >6.4%  Glycemic control for   <7.0% adults with diabetes     CBG: Recent Labs  Lab 07/11/23 0326 07/11/23 0437 07/11/23 0544 07/11/23 0702 07/11/23 0759  GLUCAP 106* 101* 104* 110* 102*    Review of Systems:   Remains altered  Past Medical History:  He,  has a past medical history of Anemia, Cellulitis and abscess of toe of left foot, Diabetes mellitus without complication ([name]), Diabetic gastroparesis ([name]), 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   Surgical History:   Past Surgical History:  Procedure Laterality Date   AMPUTATION Right 02/02/2018   Procedure:  RIGHT FIFTH TOE AND METATARSAL AMPUTATION. Filetted toe flap metatarsal resection. Debridement Plantar Foot wound;  Surgeon: Park Liter, DPM;  Location: [facility] OR;  Service: Podiatry;  Laterality: Right;   AMPUTATION Left 08/20/2018   Procedure: FIFTH METATARSAL BONE BIOPSY;  Surgeon: Park Liter, DPM;  Location: [facility] OR;  Service: Podiatry;  Laterality: Left;   AMPUTATION Left 10/28/2018   Procedure: LEFT GREAT TOE AMPUTATION;  Surgeon: Park Liter, DPM;  Location: [facility] OR;  Service: Podiatry;  Laterality: Left;   AMPUTATION Left 09/16/2020   Procedure: AMPUTATION BELOW KNEE;  Surgeon: Larina Earthly, MD;  Location: [facility] OR;  Service: Vascular;  Laterality: Left;   AMPUTATION Left 10/15/2020   Procedure: AMPUTATION ABOVE KNEE;  Surgeon: Sherren Kerns, MD;  Location: [facility] OR;  Service: Vascular;  Laterality: Left;   APPLICATION OF WOUND VAC  02/02/2018   Procedure: APPLICATION OF WOUND VAC  Right Foot;  Surgeon: Park Liter, DPM;  Location: [facility] OR;  Service: Podiatry;;   APPLICATION OF WOUND VAC Left 10/28/2018   Procedure: APPLICATION OF WOUND VAC LEFT TOE;  Surgeon: Park Liter, DPM;  Location: [facility] OR;  Service: Podiatry;  Laterality: Left;   APPLICATION OF WOUND VAC Left 11/01/2018   Procedure: APPLICATION OF WOUND VAC;  Surgeon: Park Liter, DPM;  Location: [facility] OR;  Service: Podiatry;  Laterality: Left;   AV FISTULA PLACEMENT     left arm.   AV FISTULA PLACEMENT Right 12/22/2016   Procedure: INSERTION OF ARTERIOVENOUS (AV) GORE-TEX GRAFT ARM;  Surgeon: Sherren Kerns, MD;  Location: [facility] OR;  Service: Vascular;  Laterality: Right;   AV  FISTULA PLACEMENT Left 05/26/2018   Procedure: INSERTION OF  ARTERIOVENOUS (AV) GORE-TEX GRAFT LEFT ARM;  Surgeon: Nada Libman, MD;  Location: [facility] OR;  Service: Vascular;  Laterality: Left;   EYE SURGERY     I & D EXTREMITY Right 10/31/2017   Procedure: IRRIGATION AND DEBRIDEMENT RIGHT FOOT;  Surgeon: Park Liter, DPM;  Location: [facility] OR;   Service: Podiatry;  Laterality: Right;   I & D EXTREMITY Left 08/20/2018   Procedure: IRRIGATION AND DEBRIDEMENT EXTREMITY WITH SECONDARY WOUND CLOSUREAND APPLICATION OF WOUND VAC LEFT FOOT;  Surgeon: Park Liter, DPM;  Location: [facility] OR;  Service: Podiatry;  Laterality: Left;   I & D EXTREMITY Left 10/20/2018   Procedure: IRRIGATION AND DEBRIDEMENT LEFT FOOT  DEBRIDEMENT LATERAL FOOT WOUND;  Surgeon: Park Liter, DPM;  Location: [facility] OR;  Service: Podiatry;  Laterality: Left;   I & D EXTREMITY Left 10/28/2018   Procedure: IRRIGATION AND DEBRIDEMENT LEFT TOE;  Surgeon: Park Liter, DPM;  Location: [facility] OR;  Service: Podiatry;  Laterality: Left;   I & D EXTREMITY Left 09/14/2020   Procedure: IRRIGATION AND DEBRIDEMENT WRIST;  Surgeon: Betha Loa, MD;  Location: [facility] OR;  Service: Orthopedics;  Laterality: Left;   INSERTION OF DIALYSIS CATHETER     Right subclavian   IR AV DIALY SHUNT INTRO NEEDLE/INTRACATH INITIAL W/PTA/IMG RIGHT Right 02/05/2018   IR FLUORO GUIDE CV LINE LEFT  04/14/2023   IR FLUORO GUIDE CV LINE RIGHT  01/31/2020   IR FLUORO GUIDE CV LINE RIGHT  01/30/2022   IR PTA VENOUS EXCEPT DIALYSIS CIRCUIT  01/30/2022   IR REMOVAL TUN CV CATH W/O FL  04/12/2023   IR THROMBECTOMY AV FISTULA W/THROMBOLYSIS/PTA INC/SHUNT/IMG LEFT Left 08/24/2018   IR THROMBECTOMY AV FISTULA W/THROMBOLYSIS/PTA INC/SHUNT/IMG LEFT Left 01/06/2019   IR US GUIDE VASC ACCESS LEFT  08/24/2018   IR US GUIDE VASC ACCESS LEFT  04/14/2023   IR US GUIDE VASC ACCESS RIGHT  02/05/2018   IR US GUIDE VASC ACCESS RIGHT  01/31/2020   IR VENOCAVAGRAM SVC  01/30/2022   IRRIGATION AND DEBRIDEMENT FOOT Right 10/23/2018   Procedure: Irrigation and Debridement to tendon, Left Foot;  Surgeon: Park Liter, DPM;  Location: [facility] OR;  Service: Podiatry;  Laterality: Right;   IRRIGATION AND DEBRIDEMENT FOOT Left 11/01/2018   Procedure: IRRIGATION AND DEBRIDEMENT PARTIAL WOUND CLOSURE LOCAL TISSUE TRANSFER AND FLAP ROTATION, LEFT FOOT;   Surgeon: Park Liter, DPM;  Location: [facility] OR;  Service: Podiatry;  Laterality: Left;   TRANSMETATARSAL AMPUTATION N/A 08/18/2018   Procedure: IRRIGATION AND DEBRIDEMENT OF LEFT 5TH TOE AND TRANSMETATARSAL, WITH PARTICAL LEFT 5TH TOE AND METATARSAL AMPUTATION, BONE BIOPSY, WOUND VAC APPLICATION.;  Surgeon: Park Liter, DPM;  Location: [facility] OR;  Service: Podiatry;  Laterality: N/A;   UPPER EXTREMITY VENOGRAPHY N/A 11/16/2016   Procedure: Upper Extremity Venography - Right Central;  Surgeon: Sherren Kerns, MD;  Location: [facility] INVASIVE CV LAB;  Service: Cardiovascular;  Laterality: N/A;   UPPER EXTREMITY VENOGRAPHY N/A 05/25/2018   Procedure: UPPER EXTREMITY VENOGRAPHY - Bilateral;  Surgeon: Cephus Shelling, MD;  Location: [facility] INVASIVE CV LAB;  Service: Cardiovascular;  Laterality: N/A;     Social History:   reports that he has never smoked. He has never used smokeless tobacco. He reports that he does not drink alcohol and does not use drugs.   Family History:  His family history includes Diabetes in his father; Diabetes Mellitus II in an other family member;  Renal Disease in his father.   Allergies No Known Allergies   The patient is critically ill with multiple organ systems failure and requires high complexity decision making for assessment and support, frequent evaluation and titration of therapies, application of advanced monitoring technologies and extensive interpretation of multiple databases. Critical Care Time devoted to patient care services described in this note independent of APP/resident time (if applicable)  is 35 minutes.   Virl Diamond MD [facility] Personal pager: See Amion If unanswered, please page CCM On-call: #[phone]

## 2023-07-11 NOTE — Progress Notes (Signed)
Will transition off insulin drip  This will help avoid overloading with IV fluids  Aggressively monitor and cover his sugars that are now in the 100s

## 2023-07-11 NOTE — Progress Notes (Signed)
Mother bedside and pt explainig something to mother. When staff stated he was unsatisfied with his care she stated "I don't need to know nothing. That between him and whoever."

## 2023-07-11 NOTE — Progress Notes (Signed)
Pt c/o staff being condescending. "I not crazy" "You don't know how to treat a black man. It's like I'm back in 1962!" When asked what his cocerns were stated "When I ask you for Mellow Yellow all you tell me is you don't have that and you tell me you have lemon lime" Charge nurse notified and will come talk to pt at first chance.

## 2023-07-12 ENCOUNTER — Inpatient Hospital Stay (HOSPITAL_COMMUNITY): Payer: Medicare HMO

## 2023-07-12 DIAGNOSIS — B9562 Methicillin resistant Staphylococcus aureus infection as the cause of diseases classified elsewhere: Secondary | ICD-10-CM | POA: Diagnosis not present

## 2023-07-12 DIAGNOSIS — T80211A Bloodstream infection due to central venous catheter, initial encounter: Secondary | ICD-10-CM | POA: Diagnosis not present

## 2023-07-12 DIAGNOSIS — E1010 Type 1 diabetes mellitus with ketoacidosis without coma: Secondary | ICD-10-CM | POA: Diagnosis not present

## 2023-07-12 DIAGNOSIS — R7881 Bacteremia: Secondary | ICD-10-CM | POA: Diagnosis not present

## 2023-07-12 DIAGNOSIS — N186 End stage renal disease: Secondary | ICD-10-CM | POA: Diagnosis not present

## 2023-07-12 DIAGNOSIS — M25521 Pain in right elbow: Secondary | ICD-10-CM

## 2023-07-12 DIAGNOSIS — T827XXA Infection and inflammatory reaction due to other cardiac and vascular devices, implants and grafts, initial encounter: Secondary | ICD-10-CM | POA: Diagnosis present

## 2023-07-12 LAB — BASIC METABOLIC PANEL
Anion gap: 13 (ref 5–15)
BUN: 39 mg/dL — ABNORMAL HIGH (ref 6–20)
CO2: 22 mmol/L (ref 22–32)
Calcium: 8.6 mg/dL — ABNORMAL LOW (ref 8.9–10.3)
Chloride: 96 mmol/L — ABNORMAL LOW (ref 98–111)
Creatinine, Ser: 8.83 mg/dL — ABNORMAL HIGH (ref 0.61–1.24)
GFR, Estimated: 7 mL/min — ABNORMAL LOW (ref >60)
Glucose, Bld: 100 mg/dL — ABNORMAL HIGH (ref 70–99)
Potassium: 3.3 mmol/L — ABNORMAL LOW (ref 3.5–5.1)
Sodium: 131 mmol/L — ABNORMAL LOW (ref 135–145)

## 2023-07-12 LAB — CULTURE, BLOOD (ROUTINE X 2)

## 2023-07-12 LAB — GLUCOSE, CAPILLARY
Glucose-Capillary: 72 mg/dL (ref 70–99)
Glucose-Capillary: 72 mg/dL (ref 70–99)
Glucose-Capillary: 64 mg/dL — ABNORMAL LOW (ref 70–99)
Glucose-Capillary: 97 mg/dL (ref 70–99)
Glucose-Capillary: 108 mg/dL — ABNORMAL HIGH (ref 70–99)

## 2023-07-12 LAB — CBC
HCT: 29.9 % — ABNORMAL LOW (ref 39.0–52.0)
Hemoglobin: 10 g/dL — ABNORMAL LOW (ref 13.0–17.0)
MCH: 27.5 pg (ref 26.0–34.0)
MCHC: 33.4 g/dL (ref 30.0–36.0)
MCV: 82.4 fL (ref 80.0–100.0)
Platelets: 93 10*3/uL — ABNORMAL LOW (ref 150–400)
RBC: 3.63 MIL/uL — ABNORMAL LOW (ref 4.22–5.81)
RDW: 13.5 % (ref 11.5–15.5)
WBC: 12.1 10*3/uL — ABNORMAL HIGH (ref 4.0–10.5)
nRBC: 0 % (ref 0.0–0.2)

## 2023-07-12 MED ORDER — LIDOCAINE HCL (PF) 1 % IJ SOLN: 5.0000 mL | INTRAMUSCULAR | Status: DC | PRN

## 2023-07-12 MED ORDER — NEPRO/CARBSTEADY PO LIQD: 237.0000 mL | ORAL | Status: DC | PRN

## 2023-07-12 MED ORDER — HEPARIN SODIUM (PORCINE) 1000 UNIT/ML DIALYSIS
1000.0000 [IU] | INTRAMUSCULAR | Status: DC | PRN
  Administered 2023-07-14: 2800 [IU]
  Filled 2023-07-12 (×3): qty 1

## 2023-07-12 MED ORDER — ANTICOAGULANT SODIUM CITRATE 4% (200MG/5ML) IV SOLN: 5.0000 mL | Status: DC | PRN

## 2023-07-12 MED ORDER — PENTAFLUOROPROP-TETRAFLUOROETH EX AERO: 1.0000 | INHALATION_SPRAY | CUTANEOUS | Status: DC | PRN

## 2023-07-12 MED ORDER — CINACALCET HCL 30 MG PO TABS
120.0000 mg | ORAL_TABLET | Freq: Every day | ORAL | Status: DC
  Administered 2023-07-12 – 2023-07-18 (×5): 120 mg via ORAL
  Filled 2023-07-12 (×6): qty 4

## 2023-07-12 MED ORDER — LIDOCAINE-PRILOCAINE 2.5-2.5 % EX CREA: 1.0000 | TOPICAL_CREAM | CUTANEOUS | Status: DC | PRN

## 2023-07-12 MED ORDER — ADULT MULTIVITAMIN W/MINERALS CH
1.0000 | ORAL_TABLET | Freq: Every day | ORAL | Status: DC
  Administered 2023-07-12 – 2023-07-18 (×6): 1 via ORAL
  Filled 2023-07-12 (×7): qty 1

## 2023-07-12 MED ORDER — ALTEPLASE 2 MG IJ SOLR: 2.0000 mg | Freq: Once | INTRAMUSCULAR | Status: DC | PRN

## 2023-07-12 MED ORDER — VANCOMYCIN HCL 750 MG/150ML IV SOLN
750.0000 mg | INTRAVENOUS | Status: DC
  Administered 2023-07-14: 750 mg via INTRAVENOUS
  Filled 2023-07-12 (×2): qty 150

## 2023-07-12 MED ORDER — CALCITRIOL 0.5 MCG PO CAPS
1.5000 ug | ORAL_CAPSULE | ORAL | Status: DC
Start: 2023-07-12 — End: 2023-07-19
  Administered 2023-07-12 – 2023-07-14 (×2): 1.5 ug via ORAL
  Filled 2023-07-12 (×3): qty 3

## 2023-07-12 NOTE — Plan of Care (Signed)

## 2023-07-12 NOTE — Progress Notes (Addendum)
Subjective: No new complaints, ? Right elbow pain   Antibiotics:  Anti-infectives (From admission, onward)    Start     Dose/Rate Route Frequency Ordered Stop   07/14/23 1200  vancomycin (VANCOREADY) IVPB 750 mg/150 mL        750 mg 150 mL/hr over 60 Minutes Intravenous Every M-W-F (Hemodialysis) 07/12/23 1203     07/11/23 2330  vancomycin (VANCOREADY) IVPB 750 mg/150 mL        750 mg 150 mL/hr over 60 Minutes Intravenous  Once 07/11/23 2030 07/12/23 0110   07/11/23 1800  vancomycin (VANCOREADY) IVPB 750 mg/150 mL  Status:  Discontinued        750 mg 150 mL/hr over 60 Minutes Intravenous  Once 07/11/23 0856 07/11/23 1810   07/10/23 2318  vancomycin variable dose per unstable renal function (pharmacist dosing)  Status:  Discontinued         Does not apply See admin instructions 07/10/23 2319 07/12/23 1203   07/10/23 1429  vancomycin (VANCOREADY) IVPB 750 mg/150 mL  Status:  Discontinued        750 mg 150 mL/hr over 60 Minutes Intravenous Every Dialysis 07/10/23 1429 07/11/23 2030   07/10/23 1230  fidaxomicin (DIFICID) tablet 200 mg  Status:  Discontinued        200 mg Oral 2 times daily 07/10/23 1139 07/10/23 1207   07/10/23 1000  piperacillin-tazobactam (ZOSYN) IVPB 2.25 g  Status:  Discontinued        2.25 g 100 mL/hr over 30 Minutes Intravenous Every 8 hours 07/10/23 0908 07/10/23 1350   07/09/23 2330  vancomycin (VANCOREADY) IVPB 1750 mg/350 mL        1,750 mg 175 mL/hr over 120 Minutes Intravenous  Once 07/09/23 2329 07/10/23 0734   07/09/23 2330  piperacillin-tazobactam (ZOSYN) IVPB 3.375 g        3.375 g 100 mL/hr over 30 Minutes Intravenous  Once 07/09/23 2329 07/10/23 0240       Medications: Scheduled Meds:  calcitRIOL  1.5 mcg Oral Q M,W,F-HD   Chlorhexidine Gluconate Cloth  6 each Topical Daily   Chlorhexidine Gluconate Cloth  6 each Topical Q0600   Chlorhexidine Gluconate Cloth  6 each Topical Q0600   cinacalcet  120 mg Oral Q supper   famotidine   10 mg Oral Daily   feeding supplement (GLUCERNA SHAKE)  237 mL Oral TID BM   gabapentin  100 mg Oral TID   heparin  5,000 Units Subcutaneous Q8H   insulin aspart  0-5 Units Subcutaneous QHS   insulin aspart  0-6 Units Subcutaneous TID WC   insulin glargine-yfgn  10 Units Subcutaneous BID   multivitamin with minerals  1 tablet Oral QHS   mupirocin ointment  1 Application Nasal BID   Continuous Infusions:  anticoagulant sodium citrate     [START ON 07/14/2023] vancomycin     PRN Meds:.acetaminophen, alteplase, anticoagulant sodium citrate, dextrose, docusate sodium, feeding supplement (NEPRO CARB STEADY), heparin, heparin sodium (porcine), lidocaine (PF), lidocaine-prilocaine, mouth rinse, pentafluoroprop-tetrafluoroeth, polyethylene glycol, promethazine    Objective: Weight change: -1.3 kg  Intake/Output Summary (Last 24 hours) at 07/12/2023 1441 Last data filed at 07/12/2023 0300 Gross per 24 hour  Intake 1050 ml  Output 500 ml  Net 550 ml   Blood pressure (!) 120/51, pulse 60, temperature (!) 100.9 F (38.3 C), temperature source Oral, resp. rate (!) 25, weight 75.7 kg, SpO2 (!) 73%. Temp:  [97.3 F (36.3 C)-100.9 F (38.3 C)]  100.9 F (38.3 C) (10/14 1140) Pulse Rate:  [60-104] 60 (10/14 1200) Resp:  [5-25] 25 (10/14 1200) BP: (68-125)/(31-101) 120/51 (10/14 1200) SpO2:  [73 %-100 %] 73 % (10/14 1200) Weight:  [75.7 kg] 75.7 kg (10/14 0352)  Physical Exam: Physical Exam Constitutional:      Appearance: He is well-developed.  HENT:     Head: Normocephalic and atraumatic.  Eyes:     Conjunctiva/sclera: Conjunctivae normal.  Cardiovascular:     Rate and Rhythm: Normal rate.     Heart sounds: No murmur heard.    No friction rub. No gallop.  Pulmonary:     Effort: Pulmonary effort is normal. No respiratory distress.     Breath sounds: Normal breath sounds. No stridor. No wheezing.  Abdominal:     General: There is no distension.     Palpations: Abdomen is soft.   Musculoskeletal:        General: Normal range of motion.     Cervical back: Normal range of motion and neck supple.  Skin:    General: Skin is warm and dry.  Neurological:     General: No focal deficit present.     Mental Status: He is alert and oriented to person, place, and time.  Psychiatric:        Mood and Affect: Mood normal.        Speech: Speech is delayed.        Behavior: Behavior normal.        Thought Content: Thought content normal.        Cognition and Memory: Cognition is impaired. Memory is impaired.        Judgment: Judgment normal.        Right elbow area    Right foot    Left amputation site    CBC:    BMET Recent Labs    07/11/23 1300 07/12/23 0520  NA 132* 131*  K 4.2 3.3*  CL 98 96*  CO2 16* 22  GLUCOSE 163* 100*  BUN 77* 39*  CREATININE 15.58* 8.83*  CALCIUM 9.2 8.6*     Liver Panel  Recent Labs    07/09/23 2231 07/10/23 0142  PROT 7.0 6.9  ALBUMIN 2.8* 2.6*  AST 33 29  ALT 13 16  ALKPHOS 178* 176*  BILITOT 1.9* 2.2*       Sedimentation Rate No results for input(s): "ESRSEDRATE" in the last 72 hours. C-Reactive Protein No results for input(s): "CRP" in the last 72 hours.  Micro Results: Recent Results (from the past 720 hour(s))  Culture, blood (routine x 2)     Status: Abnormal   Collection Time: 07/10/23 12:15 AM   Specimen: BLOOD  Result Value Ref Range Status   Specimen Description BLOOD BLOOD RIGHT ARM  Final   Special Requests   Final    BOTTLES DRAWN AEROBIC AND ANAEROBIC Blood Culture results may not be optimal due to an excessive volume of blood received in culture bottles   Culture  Setup Time   Final    GRAM POSITIVE COCCI IN CLUSTERS IN BOTH AEROBIC AND ANAEROBIC BOTTLES CRITICAL RESULT CALLED TO, READ BACK BY AND VERIFIED WITH: Ebony Cargo [id number] AT 1234 BY EC Performed at [facility], [address] [place], [name] [address]    Culture METHICILLIN RESISTANT STAPHYLOCOCCUS  AUREUS (A)  Final   Report Status 07/12/2023 FINAL  Final   Organism ID, Bacteria METHICILLIN RESISTANT STAPHYLOCOCCUS AUREUS  Final  Susceptibility   Methicillin resistant staphylococcus aureus - MIC*    CIPROFLOXACIN >=8 RESISTANT Resistant     ERYTHROMYCIN >=8 RESISTANT Resistant     GENTAMICIN <=0.5 SENSITIVE Sensitive     OXACILLIN >=4 RESISTANT Resistant     TETRACYCLINE <=1 SENSITIVE Sensitive     VANCOMYCIN 1 SENSITIVE Sensitive     TRIMETH/SULFA <=10 SENSITIVE Sensitive     CLINDAMYCIN <=0.25 SENSITIVE Sensitive     RIFAMPIN <=0.5 SENSITIVE Sensitive     Inducible Clindamycin NEGATIVE Sensitive     LINEZOLID 2 SENSITIVE Sensitive     * METHICILLIN RESISTANT STAPHYLOCOCCUS AUREUS  Blood Culture ID Panel (Reflexed)     Status: Abnormal   Collection Time: 07/10/23 12:15 AM  Result Value Ref Range Status   Enterococcus faecalis NOT DETECTED NOT DETECTED Final   Enterococcus Faecium NOT DETECTED NOT DETECTED Final   Listeria monocytogenes NOT DETECTED NOT DETECTED Final   Staphylococcus species DETECTED (A) NOT DETECTED Final    Comment: CRITICAL RESULT CALLED TO, READ BACK BY AND VERIFIED WITH: PHARMD JENNY ZHOU [id number] AT 1234 BY EC    Staphylococcus aureus (BCID) DETECTED (A) NOT DETECTED Final    Comment: Methicillin (oxacillin)-resistant Staphylococcus aureus (MRSA). MRSA is predictably resistant to beta-lactam antibiotics (except ceftaroline). Preferred therapy is vancomycin unless clinically contraindicated. Patient requires contact precautions if  hospitalized. CRITICAL RESULT CALLED TO, READ BACK BY AND VERIFIED WITH: PHARMD JENNY ZHOU [phone] AT 1234 BY EC    Staphylococcus epidermidis NOT DETECTED NOT DETECTED Final   Staphylococcus lugdunensis NOT DETECTED NOT DETECTED Final   Streptococcus species NOT DETECTED NOT DETECTED Final   Streptococcus agalactiae NOT DETECTED NOT DETECTED Final   Streptococcus pneumoniae NOT DETECTED NOT DETECTED Final    Streptococcus pyogenes NOT DETECTED NOT DETECTED Final   A.calcoaceticus-baumannii NOT DETECTED NOT DETECTED Final   Bacteroides fragilis NOT DETECTED NOT DETECTED Final   Enterobacterales NOT DETECTED NOT DETECTED Final   Enterobacter cloacae complex NOT DETECTED NOT DETECTED Final   Escherichia coli NOT DETECTED NOT DETECTED Final   Klebsiella aerogenes NOT DETECTED NOT DETECTED Final   Klebsiella oxytoca NOT DETECTED NOT DETECTED Final   Klebsiella pneumoniae NOT DETECTED NOT DETECTED Final   Proteus species NOT DETECTED NOT DETECTED Final   Salmonella species NOT DETECTED NOT DETECTED Final   Serratia marcescens NOT DETECTED NOT DETECTED Final   Haemophilus influenzae NOT DETECTED NOT DETECTED Final   Neisseria meningitidis NOT DETECTED NOT DETECTED Final   Pseudomonas aeruginosa NOT DETECTED NOT DETECTED Final   Stenotrophomonas maltophilia NOT DETECTED NOT DETECTED Final   Candida albicans NOT DETECTED NOT DETECTED Final   Candida auris NOT DETECTED NOT DETECTED Final   Candida glabrata NOT DETECTED NOT DETECTED Final   Candida krusei NOT DETECTED NOT DETECTED Final   Candida parapsilosis NOT DETECTED NOT DETECTED Final   Candida tropicalis NOT DETECTED NOT DETECTED Final   Cryptococcus neoformans/gattii NOT DETECTED NOT DETECTED Final   Meth resistant mecA/C and MREJ DETECTED (A) NOT DETECTED Final    Comment: CRITICAL RESULT CALLED TO, READ BACK BY AND VERIFIED WITH: Valarie Merino ZHOU [phone] AT 1234 BY EC Performed at [facility], [address], [place], [name] [address]   Culture, blood (routine x 2)     Status: Abnormal   Collection Time: 07/10/23 12:16 AM   Specimen: BLOOD  Result Value Ref Range Status   Specimen Description BLOOD BLOOD RIGHT HAND  Final   Special Requests   Final    BOTTLES  DRAWN AEROBIC AND ANAEROBIC Blood Culture results may not be optimal due to an excessive volume of blood received in culture bottles   Culture  Setup Time   Final     GRAM POSITIVE COCCI IN CLUSTERS IN BOTH AEROBIC AND ANAEROBIC BOTTLES CRITICAL VALUE NOTED.  VALUE IS CONSISTENT WITH PREVIOUSLY REPORTED AND CALLED VALUE.    Culture (A)  Final    STAPHYLOCOCCUS AUREUS SUSCEPTIBILITIES PERFORMED ON PREVIOUS CULTURE WITHIN THE LAST 5 DAYS. Performed at [facility], [address] [place], [name] [address]    Report Status 07/12/2023 FINAL  Final  SARS Coronavirus 2 by RT PCR (hospital order, performed in [facility] hospital lab) *cepheid single result test*     Status: None   Collection Time: 07/10/23 12:17 AM   Specimen: Nasal Swab  Result Value Ref Range Status   SARS Coronavirus 2 by RT PCR NEGATIVE NEGATIVE Final    Comment: Performed at [facility], [address], [place], [name] [address]  MRSA Next Gen by PCR, Nasal     Status: Abnormal   Collection Time: 07/10/23  3:26 AM   Specimen: Nasal Mucosa; Nasal Swab  Result Value Ref Range Status   MRSA by PCR Next Gen DETECTED (A) NOT DETECTED Final    Comment: RESULT CALLED TO, READ BACK BY AND VERIFIED WITH: B CUMMINGS,RN@0646  07/10/23 MK (NOTE) The GeneXpert MRSA Assay (FDA approved for NASAL specimens only), is one component of a comprehensive MRSA colonization surveillance program. It is not intended to diagnose MRSA infection nor to guide or monitor treatment for MRSA infections. Test performance is not FDA approved in patients less than 13 years old. Performed at [facility], [address], [place], [name] [address]   C Difficile Quick Screen w PCR reflex     Status: Abnormal   Collection Time: 07/10/23  8:32 AM   Specimen: STOOL  Result Value Ref Range Status   C Diff antigen POSITIVE (A) NEGATIVE Final   C Diff toxin NEGATIVE NEGATIVE Final   C Diff interpretation Results are indeterminate. See PCR results.  Final    Comment: Performed at [facility] Lab, [address] [place], [name] [address]  C. Diff by PCR, Reflexed     Status: None   Collection  Time: 07/10/23  8:32 AM  Result Value Ref Range Status   Toxigenic C. Difficile by PCR NEGATIVE NEGATIVE Final    Comment: Patient is colonized with non toxigenic C. difficile. May not need treatment unless significant symptoms are present. Performed at [facility], [address] [place], [name] [address]   Culture, blood (Routine X 2) w Reflex to ID Panel     Status: None (Preliminary result)   Collection Time: 07/11/23  8:21 AM   Specimen: BLOOD LEFT ARM  Result Value Ref Range Status   Specimen Description BLOOD LEFT ARM  Final   Special Requests   Final    BOTTLES DRAWN AEROBIC AND ANAEROBIC Blood Culture adequate volume   Culture   Final    NO GROWTH < 24 HOURS Performed at [facility], [address] [place], [name] [address]    Report Status PENDING  Incomplete  Culture, blood (Routine X 2) w Reflex to ID Panel     Status: None (Preliminary result)   Collection Time: 07/11/23  8:44 AM   Specimen: BLOOD RIGHT ARM  Result Value Ref Range Status   Specimen Description BLOOD RIGHT  ARM  Final   Special Requests   Final    BOTTLES DRAWN AEROBIC ONLY Blood Culture adequate volume   Culture   Final    NO GROWTH < 24 HOURS Performed at [facility], [address], [place], [name] [address]    Report Status PENDING  Incomplete    Studies/Results: ECHOCARDIOGRAM COMPLETE  Result Date: 07/11/2023    ECHOCARDIOGRAM REPORT   Patient Name:   SELDEN ROMO Date of Exam: 07/11/2023 Medical Rec #:  [id number]     Height:       67.0 in Accession #:    [id number]    Weight:       169.8 lb Date of Birth:  March 07, 1981    BSA:          1.886 m Patient Age:    41 years      BP:           98/72 mmHg Patient Gender: M             HR:           72 bpm. Exam Location:  Inpatient Procedure: 2D Echo, Color Doppler, Cardiac Doppler and Intracardiac            Opacification Agent Indications:    Bacteremia R78.81  History:        Patient has prior history of Echocardiogram  examinations, most                 recent 04/26/2019. Risk Factors:Hypertension, Diabetes and ESRD.  Sonographer:    Irving Burton Senior RDCS Referring Phys: Encarnacion Chu COMER  Sonographer Comments: Technically difficult due to poor echo windows IMPRESSIONS  1. Left ventricular ejection fraction, by estimation, is 25 to 30%. Left ventricular ejection fraction by PLAX is 30 %. The left ventricle has severely decreased function. The left ventricle demonstrates global hypokinesis. Left ventricular diastolic parameters are consistent with Grade I diastolic dysfunction (impaired relaxation).  2. Right ventricular systolic function is moderately reduced. The right ventricular size is normal. There is normal pulmonary artery systolic pressure.  3. Possible filamentous structure on the ventricular side of the mitral valve (image 54). The mitral valve is abnormal. Trivial mitral valve regurgitation.  4. Aortic Valve leaflets and annulus appear thickened, would recommend TEE to assess for endocarditis in the setting of bacteremia. The aortic valve is abnormal. Aortic valve regurgitation is not visualized.  5. The inferior vena cava is normal in size with <50% respiratory variability, suggesting right atrial pressure of 8 mmHg. Conclusion(s)/Recommendation(s): EF significantly declined compared to prior study. FINDINGS  Left Ventricle: Left ventricular ejection fraction, by estimation, is 25 to 30%. Left ventricular ejection fraction by PLAX is 30 %. The left ventricle has severely decreased function. The left ventricle demonstrates global hypokinesis. Definity contrast agent was given IV to delineate the left ventricular endocardial borders. The left ventricular internal cavity size was normal in size. There is no left ventricular hypertrophy. Left ventricular diastolic parameters are consistent with Grade I diastolic dysfunction (impaired relaxation). Right Ventricle: The right ventricular size is normal. No increase in right  ventricular wall thickness. Right ventricular systolic function is moderately reduced. There is normal pulmonary artery systolic pressure. The tricuspid regurgitant velocity is 2.18 m/s, and with an assumed right atrial pressure of 8 mmHg, the estimated right ventricular systolic pressure is 27.0 mmHg. Left Atrium: Left atrial size was normal in size. Right Atrium: Right atrial size was normal in size. Pericardium: There is  no evidence of pericardial effusion. Mitral Valve: Possible filamentous structure on the ventricular side of the mitral valve (image 54). The mitral valve is abnormal. Trivial mitral valve regurgitation. Tricuspid Valve: The tricuspid valve is normal in structure. Tricuspid valve regurgitation is mild. Aortic Valve: Aortic Valve leaflets and annulus appear thickened, would recommend TEE to assess for endocarditis in the setting of bacteremia. The aortic valve is abnormal. Aortic valve regurgitation is not visualized. Pulmonic Valve: The pulmonic valve was normal in structure. Pulmonic valve regurgitation is not visualized. Aorta: The aortic root and ascending aorta are structurally normal, with no evidence of dilitation. Venous: The inferior vena cava is normal in size with less than 50% respiratory variability, suggesting right atrial pressure of 8 mmHg. IAS/Shunts: The atrial septum is grossly normal.  LEFT VENTRICLE PLAX 2D LV EF:         Left            Diastology                ventricular     LV e' medial:    4.79 cm/s                ejection        LV E/e' medial:  16.3                fraction by     LV e' lateral:   6.74 cm/s                PLAX is 30      LV E/e' lateral: 11.6                %. LVIDd:         4.90 cm LVIDs:         4.20 cm LV PW:         0.80 cm LV IVS:        0.70 cm LVOT diam:     1.80 cm LV SV:         42 LV SV Index:   22 LVOT Area:     2.54 cm  RIGHT VENTRICLE RV S prime:     6.42 cm/s TAPSE (M-mode): 1.1 cm LEFT ATRIUM             Index        RIGHT ATRIUM            Index LA diam:        3.30 cm 1.75 cm/m   RA Area:     14.00 cm LA Vol (A2C):   43.7 ml 23.17 ml/m  RA Volume:   35.50 ml  18.82 ml/m LA Vol (A4C):   52.3 ml 27.73 ml/m LA Biplane Vol: 50.8 ml 26.93 ml/m  AORTIC VALVE LVOT Vmax:   92.10 cm/s LVOT Vmean:  69.800 cm/s LVOT VTI:    0.164 m  AORTA Ao Root diam: 2.80 cm MITRAL VALVE               TRICUSPID VALVE MV Area (PHT): 2.44 cm    TR Peak grad:   19.0 mmHg MV Decel Time: 311 msec    TR Vmax:        218.00 cm/s MV E velocity: 78.00 cm/s MV A velocity: 90.30 cm/s  SHUNTS MV E/A ratio:  0.86        Systemic VTI:  0.16 m  Systemic Diam: 1.80 cm Carolan Clines Electronically signed by Carolan Clines Signature Date/Time: 07/11/2023/11:47:00 AM    Final    DG CHEST PORT 1 VIEW  Result Date: 07/10/2023 CLINICAL DATA:  Evaluate central venous catheter placement EXAM: PORTABLE CHEST 1 VIEW COMPARISON:  07/09/2023 FINDINGS: There is a left IJ catheter identified. The catheter courses along the left side of the superior mediastinum with tip terminating over the aortic knob superiorly. No pneumothorax identified. Stable cardiac enlargement. No pleural fluid, interstitial edema or airspace disease. IMPRESSION: 1. Left IJ catheter courses along the left side of the superior mediastinum with tip terminating over the aortic knob superiorly. This does not conform to the normal venous anatomy and I am concerned that this may be within an arterial structure. Repositioning may be indicated. 2. No pneumothorax. 3. Stable cardiac enlargement. These results will be called to the ordering clinician or representative by the Radiologist Assistant, and communication documented in the PACS or [employer]. Electronically Signed   By: Signa Kell M.D.   On: 07/10/2023 17:04      Assessment/Plan:  INTERVAL HISTORY:  patient remains in the ICU   Principal Problem:   MRSA bacteremia Active Problems:   DKA (diabetic ketoacidosis) ([name])    Altered mental status    [name] is a 42 y.o. 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 Not clear whether he did or did not receive antibiotics for 4 weeks despite leaving AMA but he is again admitted with MRSA uremia and sepsis: He has been found to have filamentous structure on the Mitral valve  #1 MRSA uremia and sepsis:  His dialysis catheter needs to be removed and he needs to have a catheter holiday to affect cure of his bloodstream infection  When his recurrent bacteremias  and 2 D echo findings he needs a TEE though HD line catheter is most pressing issue  His  left-sided amputation site does not appear to be having any issues in his right foot does not have any obvious deep infection he is complaining of some pain in his elbow on the right side.  I will obtain plain films of the elbow and also of the right foot   I have personally spent 54 minutes involved in face-to-face and non-face-to-face activities for this patient on the day of the visit. Professional time spent includes the following activities: Preparing to see the patient (review of tests), Obtaining and/or reviewing separately obtained history (admission/discharge record), Performing a medically appropriate examination and/or evaluation , Ordering medications/tests/procedures, referring and communicating with other health care professionals, Documenting clinical information in the EMR, Independently interpreting results (not separately reported), Communicating results to the patient/family/caregiver, Counseling and educating the patient/family/caregiver and Care coordination (not separately reported).     LOS: 2 days   Acey Lav 07/12/2023, 2:41 PM

## 2023-07-12 NOTE — Progress Notes (Signed)
Pharmacy Antibiotic Note  [name] is a 42 y.o. male admitted on 07/09/2023 with sepsis, found down. MRSA bacteremia. ESRD- last HD ended 10/14 at 2AM. Vancomycin 750mg  given after HD. Next HD on Wed.   Plan: Schedule Vancomycin 750 mg post HD MWF - next dose due Wednesday F/u culture data for antibiotic regimen, continue until finalized  Weight: 75.7 kg (166 lb 14.2 oz)  Temp (24hrs), Avg:99.2 F (37.3 C), Min:97.3 F (36.3 C), Max:100.9 F (38.3 C)  Recent Labs  Lab 07/09/23 2231 07/09/23 2326 07/10/23 0014 07/10/23 0142 07/10/23 0551 07/10/23 0901 07/10/23 1309 07/10/23 2212 07/11/23 0234 07/11/23 0547 07/11/23 1300 07/12/23 0520  WBC 20.7*  --   --  20.5*  --   --   --   --   --  16.0*  --  12.1*  CREATININE 16.48*  --    < > 15.81* 15.93* 15.12*   < > 15.19* 15.24* 15.34* 15.58* 8.83*  LATICACIDVEN  --  2.4*  --  1.9 2.2* 2.3*  --   --   --   --   --   --    < > = values in this interval not displayed.    Estimated Creatinine Clearance: 10.1 mL/min (A) (by C-G formula based on SCr of 8.83 mg/dL (H)).    No Known Allergies  Microbiology results: 10/12 BCx: MRSA 10/12 MRSA PCR: positive 10/12 Cdiff PCR positive, toxin negative  Thank you for allowing pharmacy to be a part of this patient's care.  Link Snuffer, PharmD, BCPS, BCCCP Please refer to [facility] for [facility] Pharmacy numbers 07/12/2023 12:03 PM

## 2023-07-12 NOTE — Progress Notes (Signed)
Heart Failure Navigator Progress Note  Assessed for Heart & Vascular TOC clinic readiness.  Patient does not meet criteria due to ESRD on Hemodialysis.   Navigator will sign off at this time.  Roxy Horseman, RN, BSN [facility] Heart Failure Navigator Secure Chat Only

## 2023-07-12 NOTE — Progress Notes (Signed)
[facility] Progress Note  Subjective: seen in ICU, tolerated HD fine overnight.  No new complaints.  Vitals:   07/12/23 0815 07/12/23 0830 07/12/23 0845 07/12/23 0900  BP: 95/61 93/69 (!) 110/58 103/63  Pulse: 98 (!) 101 98 99  Resp: 15 13 14 20   Temp:      TempSrc:      SpO2: 98% 99% 99% 97%  Weight:        Exam: Gen alert, no distress, confused No jvd or bruits Chest clear bilat to bases RRR no RG Abd soft ntnd no mass or ascites +bs Ext no pitting edema UE's or LE's Neuro is talking but confused, no focal deficits noted    [facility] TDC --> dislodged this admission     Temp cath R femoral vein      Renal-related home meds: - sensipar 120 at bedtime - gabapentin 100mg  - renvavite - renvela 3 ac tid      OP HD: MWF [facility] 4h  400/1.5    77kg   2/3 bath  [facility] TDC    Heparin none - last OP HD 10/7, post wt 74.6kg - coming off 75- 76kg - misses HD once every other week approx - rocaltrol 1.50 mcg  - no esa    CXR 10/12- no edema, no active disease   Assessment/ Plan: DKA - s/p insulin drip, BS's down in the 100s this am. Per CCM AMS - DKA and/or sepsis. Getting empiric IV abx and IV insulin for dka Sepsis - hx of MRSA bacteremia in 03/2023. On vanc/ zosyn. BCx's + for MRSA 10/12, 10/13 NGTD <24h. ESRD - on HD MWF. Last HD Monday, missed Wed HD.  Had HD ending 2am 10/14.  Plan next Wed HD access - the [facility] was dislodged and is now out. Suspect this was a tunnel infection. Has temp cath R femoral - if repeat blood cultures + will need formal line holiday.  Will need new TDC prior to discharge. HTN/ BP - BP's are soft in the 90s- 110 range.  Volume - no vol excess, at his dry wt. CXR clear.  UF with HD.  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 No esa at OP unit. Follow.  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 Resumed po vdra mwf. Resume binders when eating.  Hypokalemia - K 3.3, use 4K dialysate if remains low.   Estill Bakes MD [facility] Pager  [phone]   Recent Labs  Lab 07/09/23 2231 07/10/23 0014 07/10/23 0142 07/10/23 0551 07/11/23 0547 07/11/23 1300 07/12/23 0520  HGB 10.2*   < > 10.0*  --  10.2*  --  10.0*  ALBUMIN 2.8*  --  2.6*  --   --   --   --   CALCIUM 9.5  --  9.0   < > 9.0 9.2 8.6*  PHOS  --   --  7.4*  --   --   --   --   CREATININE 16.48*   < > 15.81*   < > 15.34* 15.58* 8.83*  K 5.0   < > 4.8   < > 4.0 4.2 3.3*   < > = values in this interval not displayed.   No results for input(s): "IRON", "TIBC", "FERRITIN" in the last 168 hours. Inpatient medications:  calcitRIOL  1.5 mcg Oral Q M,W,F-HD   Chlorhexidine Gluconate Cloth  6 each Topical Daily   Chlorhexidine Gluconate Cloth  6 each Topical Q0600   Chlorhexidine Gluconate Cloth  6 each Topical Q0600   cinacalcet  120 mg Oral Q supper   famotidine  10 mg Oral Daily   feeding supplement (GLUCERNA SHAKE)  237 mL Oral TID BM   gabapentin  100 mg Oral TID   heparin  5,000 Units Subcutaneous Q8H   insulin aspart  0-5 Units Subcutaneous QHS   insulin aspart  0-6 Units Subcutaneous TID WC   insulin glargine-yfgn  10 Units Subcutaneous BID   multivitamin with minerals  1 tablet Oral QHS   mupirocin ointment  1 Application Nasal BID   vancomycin variable dose per unstable renal function (pharmacist dosing)   Does not apply See admin instructions     acetaminophen, dextrose, docusate sodium, heparin sodium (porcine), mouth rinse, polyethylene glycol, promethazine

## 2023-07-12 NOTE — Inpatient Diabetes Management (Signed)
Inpatient Diabetes Program Recommendations  AACE/ADA: New Consensus Statement on Inpatient Glycemic Control (2015)  Target Ranges:  Prepandial:   less than 140 mg/dL      Peak postprandial:   less than 180 mg/dL (1-2 hours)      Critically ill patients:  140 - 180 mg/dL   Lab Results  Component Value Date   GLUCAP 72 07/12/2023   HGBA1C 8.9 (H) 07/10/2023    Review of Glycemic Control  Latest Reference Range & Units 07/11/23 17:51 07/11/23 21:35 07/12/23 07:19 07/12/23 11:39  Glucose-Capillary 70 - 99 mg/dL 914 (H) 782 (H) 72 72  (H): Data is abnormally high Diabetes history: Type 1 DM Outpatient Diabetes medications: Novolin R 12 units TID, Novolin 70/30 10 units BID Current orders for Inpatient glycemic control: Novolog 0-6 units TID & HS, Semglee 10 units BID  Inpatient Diabetes Program Recommendations:    Spoke with patient regarding outpatient diabetes management. Patient has access to insulins and does not have issues with obtaining medications.  Reviewed patient's current A1c of 8.9%. Explained what a A1c is and what it measures. Also reviewed goal A1c with patient, importance of good glucose control @ home, and blood sugar goals.  Patient has a meter and testing supplies.  Was found down and was unable to perform injections.  Has no questions at this time.   Thanks, Lujean Rave, MSN, RNC-OB Diabetes Coordinator [phone] (8a-5p)

## 2023-07-12 NOTE — Progress Notes (Signed)
NAME:  [name], MRN:  [id number], DOB:  [birth year], LOS: 2 ADMISSION DATE:  07/09/2023, CONSULTATION DATE: 07/10/2023 REFERRING MD:  Dr Suezanne Jacquet, CHIEF COMPLAINT: DKA, altered mental status  History of Present Illness:  Patient is a 42 yo M w/ pertinent PMH ESRD on HD MWF, IDDM, gastroparesis, left AKA, HTN presents to [facility] on 10/11 w/ AMS.   Patient recently admitted to [facility] on 7/12 sepsis from right diabetic foot ulcer. Cultures w/ MRSA. Patient had dialysis catheter removed and replaced. ID treated w/ vanc. Patient refused echo and left on 7/17 against medical advise.    On 10/11 patient was found by family member on ground altered. EMS arrived and dialysis catheter partially removed. Patient more alert for them. Has been complaining of diarrhea x1 week per family. Unsure if patient went to dialysis today. Transferred to [facility] ED. On arrival bp stable 107/70 and afebrile. Patient remains confused and occasionally with agitation. Given dilaudid. CBG 497. Beta H >8. VBG ph 7.06, 16, 54, 4.7. Given iv fluids and started on insulin drip for DKA. CT head negative for acute abnormality. Cxr no significant findings. CT abd/pelvis no acute findings; possible steatosis vs. Liver mass 7 x 4 cm recommending f/u w/ MRI. WBC 20.7 and LA 2.4. Sepsis protocol initiated. Cultures obtained and started on vanc/zosyn. Given ams, dka, and soft bp pccm consulted for icu admission.  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 rate 10, weight 75.7 kg, SpO2 94%.        Intake/Output Summary (Last 24 hours) at 07/12/2023 0804 Last data filed at 07/12/2023 0300 Gross per 24 hour  Intake 1545.64 ml  Output 500 ml  Net 1045.64 ml   Filed Weights   07/10/23 0312 07/11/23 0500 07/12/23 0352  Weight: 77 kg 77 kg 75.7 kg    Examination: General: Acute on chronically ill-appearing male, lying on the bed HEENT: /AT, eyes anicteric.  moist mucus membranes Neuro: Alert, awake following commands Chest: Coarse breath sounds, no wheezes or rhonchi Heart: Regular rate and rhythm, no murmurs or gallops Abdomen: Soft, nontender, nondistended, bowel sounds present Skin: No rash Extremities: Status post left AKA  Labs and images reviewed  Resolved Hospital Problem list     Assessment & Plan:  Diabetic ketoacidosis Patient's hemoglobin A1c is 8.9 Insulin infusion was transitioned to long-acting insulin after anion gap was closed Monitor fingerstick with goal 140-180 Continue sliding scale insulin  Sepsis due to MRSA bacteremia, POA Continue vancomycin ID is following Patient may need TEE later to rule out infective endocarditis  Acute encephalopathy, combined metabolic and septic Likely in the setting of DKA,  sepsis Mental status is improving Avoid sedation  End-stage renal disease on hemodialysis Malpositioned catheter Acute metabolic acidosis Nephrology is following, catheter was replaced Patient is receiving dialysis per schedule Trend BMP  Diarrhea C. difficile was ruled out  Chronic combined systolic/diastolic heart failure Outpatient follow-up with  advanced heart failure team Monitor intake and output  Hypervolemic hyponatremia Hypokalemia Closely monitor electrolytes  Anemia and thrombocytopenia of critical illness Monitor H&H and platelet count Watch for signs of bleeding   Best Practice (right click and "Reselect all SmartList Selections" daily)   Diet/type: Regular consistency DVT prophylaxis: prophylactic heparin  GI prophylaxis: N/A Lines: Dialysis Catheter Foley:  N/A Code Status:  full code Last date of multidisciplinary goals of care discussion [mother updated 10/12]  Labs   CBC: Recent Labs  Lab 07/09/23 2231 07/10/23 0014 07/10/23 0142 07/11/23 0547 07/12/23 0520  WBC 20.7*  --  20.5* 16.0* 12.1*  NEUTROABS 18.6*  --   --   --   --   HGB 10.2* 12.9*  12.6* 10.0* 10.2* 10.0*  HCT 33.3* 38.0*  37.0* 33.7* 31.4* 29.9*  MCV 92.0  --  95.7 84.9 82.4  PLT 155  --  153 106* 93*    Basic Metabolic Panel: Recent Labs  Lab 07/09/23 2231 07/10/23 0014 07/10/23 0142 07/10/23 0551 07/10/23 2212 07/11/23 0234 07/11/23 0547 07/11/23 1300 07/12/23 0520  NA 125*   < > 126*   < > 129* 129* 128* 132* 131*  K 5.0   < > 4.8   < > 4.3 3.8 4.0 4.2 3.3*  CL 85*   < > 85*   < > 96* 96* 97* 98 96*  CO2 <7*  --  <7*   < > 15* 15* 15* 16* 22  GLUCOSE 497*   < > 542*   < > 141* 120* 105* 163* 100*  BUN 64*   < > 65*   < > 68* 69* 73* 77* 39*  CREATININE 16.48*   < > 15.81*   < > 15.19* 15.24* 15.34* 15.58* 8.83*  CALCIUM 9.5  --  9.0   < > 8.7* 8.9 9.0 9.2 8.6*  MG 2.4  --  2.5*  --   --   --   --   --   --   PHOS  --   --  7.4*  --   --   --   --   --   --    < > = values in this interval not displayed.   GFR: Estimated Creatinine Clearance: 10.1 mL/min (A) (by C-G formula based on SCr of 8.83 mg/dL (H)). Recent Labs  Lab 07/09/23 2231 07/09/23 2326 07/10/23 0142 07/10/23 0551 07/10/23 0901 07/11/23 0547 07/12/23 0520  PROCALCITON  --   --  >150.00  --   --   --   --   WBC 20.7*  --  20.5*  --   --   16.0* 12.1*  LATICACIDVEN  --  2.4* 1.9 2.2* 2.3*  --   --     Liver Function Tests: Recent Labs  Lab 07/09/23 2231 07/10/23 0142  AST 33 29  ALT 13 16  ALKPHOS 178* 176*  BILITOT 1.9* 2.2*  PROT 7.0 6.9  ALBUMIN 2.8* 2.6*   No results for input(s): "LIPASE", "AMYLASE" in the last 168 hours. Recent Labs  Lab 07/09/23 2231  AMMONIA 16    ABG    Component Value Date/Time   HCO3 4.7 (L) 07/10/2023 0014  TCO2 7 (L) 07/10/2023 0014   TCO2 5 (L) 07/10/2023 0014   ACIDBASEDEF 24.0 (H) 07/10/2023 0014   O2SAT 74 07/10/2023 0014     Coagulation Profile: No results for input(s): "INR", "PROTIME" in the last 168 hours.  Cardiac Enzymes: No results for input(s): "CKTOTAL", "CKMB", "CKMBINDEX", "TROPONINI" in the last 168 hours.  HbA1C: Hemoglobin A1C  Date/Time Value Ref Range Status  11/11/2022 12:00 AM 9.5  Final  08/22/2014 06:28 AM 5.8 4.2 - 6.3 % Final    Comment:    The American Diabetes Association recommends that a primary goal of therapy should be <7% and that physicians should reevaluate the treatment regimen in patients with HbA1c values consistently >8%.    HbA1c, POC (controlled diabetic range)  Date/Time Value Ref Range Status  06/25/2022 02:02 PM 10.6 (A) 0.0 - 7.0 % Final   Hgb A1c MFr Bld  Date/Time Value Ref Range Status  07/10/2023 01:42 AM 8.9 (H) 4.8 - 5.6 % Final    Comment:    (NOTE) Pre diabetes:          5.7%-6.4%  Diabetes:              >6.4%  Glycemic control for   <7.0% adults with diabetes   04/12/2023 03:00 PM 8.7 (H) 4.8 - 5.6 % Final    Comment:    (NOTE) Pre diabetes:          5.7%-6.4%  Diabetes:              >6.4%  Glycemic control for   <7.0% adults with diabetes     CBG: Recent Labs  Lab 07/11/23 1006 07/11/23 1107 07/11/23 1751 07/11/23 2135 07/12/23 0719  GLUCAP 138* 142* 268* 209* 72      Cheri Fowler, MD [facility] See Amion for pager If no response to pager, please call  [phone] until 7pm After 7pm, Please call E-link [phone]

## 2023-07-12 NOTE — Progress Notes (Signed)
Pt receives out-pt HD at [facility] on MWF. Will assist as needed.   Olivia Canter Renal Navigator [phone]

## 2023-07-13 ENCOUNTER — Encounter (HOSPITAL_COMMUNITY): Payer: Self-pay | Admitting: Pulmonary Disease

## 2023-07-13 DIAGNOSIS — I330 Acute and subacute infective endocarditis: Secondary | ICD-10-CM | POA: Diagnosis not present

## 2023-07-13 DIAGNOSIS — N186 End stage renal disease: Secondary | ICD-10-CM

## 2023-07-13 DIAGNOSIS — R7881 Bacteremia: Secondary | ICD-10-CM | POA: Diagnosis not present

## 2023-07-13 DIAGNOSIS — Z992 Dependence on renal dialysis: Secondary | ICD-10-CM

## 2023-07-13 DIAGNOSIS — T80211A Bloodstream infection due to central venous catheter, initial encounter: Secondary | ICD-10-CM | POA: Diagnosis not present

## 2023-07-13 DIAGNOSIS — I059 Rheumatic mitral valve disease, unspecified: Secondary | ICD-10-CM | POA: Diagnosis present

## 2023-07-13 DIAGNOSIS — I420 Dilated cardiomyopathy: Secondary | ICD-10-CM

## 2023-07-13 DIAGNOSIS — B9562 Methicillin resistant Staphylococcus aureus infection as the cause of diseases classified elsewhere: Secondary | ICD-10-CM

## 2023-07-13 LAB — BASIC METABOLIC PANEL
Anion gap: 16 — ABNORMAL HIGH (ref 5–15)
BUN: 51 mg/dL — ABNORMAL HIGH (ref 6–20)
CO2: 21 mmol/L — ABNORMAL LOW (ref 22–32)
Calcium: 9.1 mg/dL (ref 8.9–10.3)
Chloride: 95 mmol/L — ABNORMAL LOW (ref 98–111)
Creatinine, Ser: 10.3 mg/dL — ABNORMAL HIGH (ref 0.61–1.24)
GFR, Estimated: 6 mL/min — ABNORMAL LOW (ref >60)
Glucose, Bld: 89 mg/dL (ref 70–99)
Potassium: 3.6 mmol/L (ref 3.5–5.1)
Sodium: 132 mmol/L — ABNORMAL LOW (ref 135–145)

## 2023-07-13 LAB — GLUCOSE, CAPILLARY
Glucose-Capillary: 41 mg/dL — CL (ref 70–99)
Glucose-Capillary: 103 mg/dL — ABNORMAL HIGH (ref 70–99)
Glucose-Capillary: 90 mg/dL (ref 70–99)
Glucose-Capillary: 84 mg/dL (ref 70–99)

## 2023-07-13 MED ORDER — SEVELAMER CARBONATE 800 MG PO TABS
2400.0000 mg | ORAL_TABLET | Freq: Three times a day (TID) | ORAL | Status: DC
  Administered 2023-07-13 – 2023-07-18 (×7): 2400 mg via ORAL
  Filled 2023-07-13 (×10): qty 3

## 2023-07-13 MED ORDER — INSULIN GLARGINE-YFGN 100 UNIT/ML ~~LOC~~ SOLN
10.0000 [IU] | Freq: Every day | SUBCUTANEOUS | Status: DC
  Filled 2023-07-13: qty 0.1

## 2023-07-13 MED ORDER — INSULIN GLARGINE-YFGN 100 UNIT/ML ~~LOC~~ SOLN
5.0000 [IU] | Freq: Every day | SUBCUTANEOUS | Status: DC
  Administered 2023-07-15: 5 [IU] via SUBCUTANEOUS
  Filled 2023-07-13 (×2): qty 0.05

## 2023-07-13 NOTE — H&P (View-Only) (Signed)
[facility] Progress Note  Subjective: Transferred out of ICU yesterday.  No new complaints.  Desat overnight thought related to aspiration and required suctioning - doing better today.    Vitals:   07/13/23 0005 07/13/23 0318 07/13/23 0730 07/13/23 0754  BP: (!) 142/84 114/74  106/73  Pulse: (!) 103 (!) 111  94  Resp: 20     Temp: 99.6 F (37.6 C) (!) 101 F (38.3 C) 99.2 F (37.3 C) 98.6 F (37 C)  TempSrc: Oral Oral Axillary Oral  SpO2: 90% 94%  99%  Weight:        Exam: Gen alert, no distress, calm in bed No jvd or bruits Chest clear bilat to bases RRR no RG Abd soft ntnd no mass or ascites +bs Ext no pitting edema UE's or LE's Neuro is talking but somewhat confused, no focal deficits noted and more coherent than yesterday    [facility] TDC --> removed this admission, site no issue     Temp cath R femoral vein      Renal-related home meds: - sensipar 120 at bedtime - gabapentin 100mg  - renvavite - renvela 3 ac tid      OP HD: MWF [facility] 4h  400/1.5    77kg   2/3 bath  [facility] TDC    Heparin none - last OP HD 10/7, post wt 74.6kg - coming off 75- 76kg - misses HD once every other week approx - rocaltrol 1.50 mcg  - no esa    CXR 10/12- no edema, no active disease   Assessment/ Plan:  AMS -  Improving - related to DKA and sepsis; suspect now with some delerium it appears but improved Sepsis - hx of MRSA bacteremia in 03/2023. On vanc/ zosyn. BCx's + for MRSA 10/12, 10/13 NGTD at 2 days. ID is following.  X rays of R elbow and foot 10/14 ok.   ESRD - on HD MWF. Tol last HD Monday fine. Plan next Wed. HD access - the [facility] was dislodged and is now out. Suspect this was a tunnel infection. Has temp cath R femoral.  Repeat cultures are neg at 2 days but he did have Tm 101 overnight.  Will hold on obtaining new TDC yet but given repeat blood cultures NGTD will leave temp HD line in for now unless ID recommends removal for line holiday. HTN/ BP - BP's are soft in the  90s- 110 range.  Volume - no vol excess, at his dry wt. CXR clear.  UF with HD.  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 No esa at OP unit. Follow.  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 Resumed po vdra mwf. Resumed binder.     Shane Bakes MD [facility] Pager [phone]   Recent Labs  Lab 07/09/23 2231 07/10/23 0014 07/10/23 0142 07/10/23 0551 07/11/23 0547 07/11/23 1300 07/12/23 0520 07/13/23 0507  HGB 10.2*   < > 10.0*  --  10.2*  --  10.0*  --   ALBUMIN 2.8*  --  2.6*  --   --   --   --   --   CALCIUM 9.5  --  9.0   < > 9.0   < > 8.6* 9.1  PHOS  --   --  7.4*  --   --   --   --   --   CREATININE 16.48*   < > 15.81*   < > 15.34*   < > 8.83* 10.30*  K  5.0   < > 4.8   < > 4.0   < > 3.3* 3.6   < > = values in this interval not displayed.   No results for input(s): "IRON", "TIBC", "FERRITIN" in the last 168 hours. Inpatient medications:  calcitRIOL  1.5 mcg Oral Q M,W,F-HD   Chlorhexidine Gluconate Cloth  6 each Topical Daily   Chlorhexidine Gluconate Cloth  6 each Topical Q0600   Chlorhexidine Gluconate Cloth  6 each Topical Q0600   cinacalcet  120 mg Oral Q supper   famotidine  10 mg Oral Daily   feeding supplement (GLUCERNA SHAKE)  237 mL Oral TID BM   gabapentin  100 mg Oral TID   heparin  5,000 Units Subcutaneous Q8H   insulin aspart  0-5 Units Subcutaneous QHS   insulin aspart  0-6 Units Subcutaneous TID WC   insulin glargine-yfgn  10 Units Subcutaneous BID   multivitamin with minerals  1 tablet Oral QHS   mupirocin ointment  1 Application Nasal BID    anticoagulant sodium citrate     [START ON 07/14/2023] vancomycin      acetaminophen, alteplase, anticoagulant sodium citrate, dextrose, docusate sodium, feeding supplement (NEPRO CARB STEADY), heparin, heparin sodium (porcine), lidocaine (PF), lidocaine-prilocaine, mouth rinse, pentafluoroprop-tetrafluoroeth, polyethylene glycol, promethazine

## 2023-07-13 NOTE — Progress Notes (Addendum)
PROGRESS NOTE  [name]  MVH:[id number] DOB: June 07, 1981 DOA: 07/09/2023 PCP: [name], NP   Brief Narrative: Patient is a 42 year old male with history of ESRD on dialysis, insulin-dependent diabetes mellitus, gastroparesis, left AKA, hypertension who was brought here on 10/11 with altered mental status.  He was recently admitted here on July for sepsis secondary to right diabetic foot ulcer, cultures grew MRSA.  Dialysis catheter was removed and replaced.  He was treated with vancomycin, refused echo and left on 7/7 against  medical advice.  On 10/11, he was found to be confused and brought to ED.  When he was brought in, dialysis catheter was partially removed.  He was also found to be in DKA.  He started on insulin drip.  Lab work showed elevated leukocytes, lactate of 2.4.  He was hypotensive.  Admitted for septic shock.  Hospital course remarkable for finding of MRSA in blood cultures.  ID following.  Nephrology following for dialysis.  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  Assessment & Plan:  Principal Problem:   MRSA bacteremia Active Problems:   DKA (diabetic ketoacidosis) ([name])   Altered mental status   Hemodialysis catheter infection ([name])   Right elbow pain  Septic shock due to MRSA bacteremia: Was admitted on July and was found to have MRSA bacteremia and left AMA.  Presented with septic shock with hypotension, leukocytosis, encephalopathy.  Blood cultures again showing MRSA.  Currently on vancomycin.  Suspected HD line infection.ID following.  May need TEE to rule out infective endocarditis. He spiked fever of 101 F this morning.   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  Possible filamentous structure on the ventricular side of the mitral valve,  thickened aortic valve leaflets/annulus .  Monitor input/output.  Will get cardiology consultation.  His last echo on 03/2019 showed normal ejection fraction  DKA: A1c of 8.9.  Initially started on insulin drip, now transition to long-acting and sliding scale.  Monitor blood sugars.  Diabetic coordinator following  Acute encephalopathy: Likely secondary to sepsis, DKA and hospital-acquired delirium..  Mental status improving.  Avoid sedation.  Today he is alert, awake, obeys commands, mostly oriented  ESRD on dialysis: Dialysis catheter were partially out when he presented.  Suspected as line infection.  Dialysis catheter removed.  Now has temporary femoral dialysis catheter .nephrology following. Marland Kitchen  Receiving dialysis.  Diarrhea: C. difficile ruled out  Electrolyte imbalances: Continue to monitor and supplement as needed.  Anemia/thrombocytopenia: Likely associated  with critical illness, ESRD.  Monitor.  Abnormal CT abdomen/pelvis: Imaging showed heterogenous somewhat geographic area of low density within the anterior liver near falciform ligament, measures about 7 x 4 cm. Possible geographic steatosis versus liver mass. 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  Will do PT/OT assessment when possible.  Patient says he works at [employer]      DVT prophylaxis:heparin injection 5,000 Units Start: 07/10/23 0600     Code Status: Full Code  Family Communication: None at the bedside  Patient status:Inpatient  Patient is from :Home  Anticipated discharge to:not sure  Estimated DC date:not sure   Consultants: ID,PCCM,nephrology  Procedures:Dialysis catheter placement  Antimicrobials:  Anti-infectives (From admission, onward)    Start  PROGRESS NOTE  [name]  MVH:[id number] DOB: June 07, 1981 DOA: 07/09/2023 PCP: [name], NP   Brief Narrative: Patient is a 42 year old male with history of ESRD on dialysis, insulin-dependent diabetes mellitus, gastroparesis, left AKA, hypertension who was brought here on 10/11 with altered mental status.  He was recently admitted here on July for sepsis secondary to right diabetic foot ulcer, cultures grew MRSA.  Dialysis catheter was removed and replaced.  He was treated with vancomycin, refused echo and left on 7/7 against  medical advice.  On 10/11, he was found to be confused and brought to ED.  When he was brought in, dialysis catheter was partially removed.  He was also found to be in DKA.  He started on insulin drip.  Lab work showed elevated leukocytes, lactate of 2.4.  He was hypotensive.  Admitted for septic shock.  Hospital course remarkable for finding of MRSA in blood cultures.  ID following.  Nephrology following for dialysis.  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  Assessment & Plan:  Principal Problem:   MRSA bacteremia Active Problems:   DKA (diabetic ketoacidosis) ([name])   Altered mental status   Hemodialysis catheter infection ([name])   Right elbow pain  Septic shock due to MRSA bacteremia: Was admitted on July and was found to have MRSA bacteremia and left AMA.  Presented with septic shock with hypotension, leukocytosis, encephalopathy.  Blood cultures again showing MRSA.  Currently on vancomycin.  Suspected HD line infection.ID following.  May need TEE to rule out infective endocarditis. He spiked fever of 101 F this morning.   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  Possible filamentous structure on the ventricular side of the mitral valve,  thickened aortic valve leaflets/annulus .  Monitor input/output.  Will get cardiology consultation.  His last echo on 03/2019 showed normal ejection fraction  DKA: A1c of 8.9.  Initially started on insulin drip, now transition to long-acting and sliding scale.  Monitor blood sugars.  Diabetic coordinator following  Acute encephalopathy: Likely secondary to sepsis, DKA and hospital-acquired delirium..  Mental status improving.  Avoid sedation.  Today he is alert, awake, obeys commands, mostly oriented  ESRD on dialysis: Dialysis catheter were partially out when he presented.  Suspected as line infection.  Dialysis catheter removed.  Now has temporary femoral dialysis catheter .nephrology following. Marland Kitchen  Receiving dialysis.  Diarrhea: C. difficile ruled out  Electrolyte imbalances: Continue to monitor and supplement as needed.  Anemia/thrombocytopenia: Likely associated  with critical illness, ESRD.  Monitor.  Abnormal CT abdomen/pelvis: Imaging showed heterogenous somewhat geographic area of low density within the anterior liver near falciform ligament, measures about 7 x 4 cm. Possible geographic steatosis versus liver mass. 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  Will do PT/OT assessment when possible.  Patient says he works at [employer]      DVT prophylaxis:heparin injection 5,000 Units Start: 07/10/23 0600     Code Status: Full Code  Family Communication: None at the bedside  Patient status:Inpatient  Patient is from :Home  Anticipated discharge to:not sure  Estimated DC date:not sure   Consultants: ID,PCCM,nephrology  Procedures:Dialysis catheter placement  Antimicrobials:  Anti-infectives (From admission, onward)    Start  for age. Electronically Signed   By: Duanne Guess D.O.   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 There is no acute fracture or dislocation. No cortical erosions are identified. There is soft tissue swelling of the midfoot. Peripheral vascular calcifications are again noted. IMPRESSION: 1. No acute fracture or dislocation. 2. 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 Electronically Signed   By: Darliss Cheney M.D.   On: 07/12/2023 19:25   ECHOCARDIOGRAM COMPLETE  Result Date: 07/11/2023    ECHOCARDIOGRAM REPORT   Patient Name:   LUDIE PAVLIK Date of Exam: 07/11/2023 Medical Rec #:  [id number]     Height:       67.0 in Accession #:    [id number]    Weight:       169.8 lb Date of Birth:  07-Sep-1981    BSA:          1.886 m Patient Age:    41 years      BP:           98/72 mmHg Patient Gender: M             HR:           72 bpm. Exam Location:  Inpatient Procedure: 2D Echo, Color Doppler, Cardiac  Doppler and Intracardiac            Opacification Agent Indications:    Bacteremia R78.81  History:        Patient has prior history of Echocardiogram examinations, most                 recent 04/26/2019. Risk Factors:Hypertension, Diabetes and ESRD.  Sonographer:    Irving Burton Senior RDCS Referring Phys: Encarnacion Chu COMER  Sonographer Comments: Technically difficult due to poor echo windows IMPRESSIONS  1. Left ventricular ejection fraction, by estimation, is 25 to 30%. Left ventricular ejection fraction by PLAX is 30 %. The left ventricle has severely decreased function. The left ventricle demonstrates global hypokinesis. Left ventricular diastolic parameters are consistent with Grade I diastolic dysfunction (impaired relaxation).  2. Right ventricular systolic function is moderately reduced. The right ventricular size is normal. There is normal pulmonary artery systolic pressure.  3. Possible filamentous structure on the ventricular side of the mitral valve (image 54). The mitral valve is abnormal. Trivial mitral valve regurgitation.  4. Aortic Valve leaflets and annulus appear thickened, would recommend TEE to assess for endocarditis in the setting of bacteremia. The aortic valve is abnormal. Aortic valve regurgitation is not visualized.  5. The inferior vena cava is normal in size with <50% respiratory variability, suggesting right atrial pressure of 8 mmHg. Conclusion(s)/Recommendation(s): EF significantly declined compared to prior study. FINDINGS  Left Ventricle: Left ventricular ejection fraction, by estimation, is 25 to 30%. Left ventricular ejection fraction by PLAX is 30 %. The left ventricle has severely decreased function. The left ventricle demonstrates global hypokinesis. Definity contrast agent was given IV to delineate the left ventricular endocardial borders. The left ventricular internal cavity size was normal in size. There is no left ventricular hypertrophy. Left ventricular diastolic parameters  are consistent with Grade I diastolic dysfunction (impaired relaxation). Right Ventricle: The right ventricular size is normal. No increase in right ventricular wall  PROGRESS NOTE  [name]  MVH:[id number] DOB: June 07, 1981 DOA: 07/09/2023 PCP: [name], NP   Brief Narrative: Patient is a 42 year old male with history of ESRD on dialysis, insulin-dependent diabetes mellitus, gastroparesis, left AKA, hypertension who was brought here on 10/11 with altered mental status.  He was recently admitted here on July for sepsis secondary to right diabetic foot ulcer, cultures grew MRSA.  Dialysis catheter was removed and replaced.  He was treated with vancomycin, refused echo and left on 7/7 against  medical advice.  On 10/11, he was found to be confused and brought to ED.  When he was brought in, dialysis catheter was partially removed.  He was also found to be in DKA.  He started on insulin drip.  Lab work showed elevated leukocytes, lactate of 2.4.  He was hypotensive.  Admitted for septic shock.  Hospital course remarkable for finding of MRSA in blood cultures.  ID following.  Nephrology following for dialysis.  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  Assessment & Plan:  Principal Problem:   MRSA bacteremia Active Problems:   DKA (diabetic ketoacidosis) ([name])   Altered mental status   Hemodialysis catheter infection ([name])   Right elbow pain  Septic shock due to MRSA bacteremia: Was admitted on July and was found to have MRSA bacteremia and left AMA.  Presented with septic shock with hypotension, leukocytosis, encephalopathy.  Blood cultures again showing MRSA.  Currently on vancomycin.  Suspected HD line infection.ID following.  May need TEE to rule out infective endocarditis. He spiked fever of 101 F this morning.   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  Possible filamentous structure on the ventricular side of the mitral valve,  thickened aortic valve leaflets/annulus .  Monitor input/output.  Will get cardiology consultation.  His last echo on 03/2019 showed normal ejection fraction  DKA: A1c of 8.9.  Initially started on insulin drip, now transition to long-acting and sliding scale.  Monitor blood sugars.  Diabetic coordinator following  Acute encephalopathy: Likely secondary to sepsis, DKA and hospital-acquired delirium..  Mental status improving.  Avoid sedation.  Today he is alert, awake, obeys commands, mostly oriented  ESRD on dialysis: Dialysis catheter were partially out when he presented.  Suspected as line infection.  Dialysis catheter removed.  Now has temporary femoral dialysis catheter .nephrology following. Marland Kitchen  Receiving dialysis.  Diarrhea: C. difficile ruled out  Electrolyte imbalances: Continue to monitor and supplement as needed.  Anemia/thrombocytopenia: Likely associated  with critical illness, ESRD.  Monitor.  Abnormal CT abdomen/pelvis: Imaging showed heterogenous somewhat geographic area of low density within the anterior liver near falciform ligament, measures about 7 x 4 cm. Possible geographic steatosis versus liver mass. 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  Will do PT/OT assessment when possible.  Patient says he works at [employer]      DVT prophylaxis:heparin injection 5,000 Units Start: 07/10/23 0600     Code Status: Full Code  Family Communication: None at the bedside  Patient status:Inpatient  Patient is from :Home  Anticipated discharge to:not sure  Estimated DC date:not sure   Consultants: ID,PCCM,nephrology  Procedures:Dialysis catheter placement  Antimicrobials:  Anti-infectives (From admission, onward)    Start  PROGRESS NOTE  [name]  MVH:[id number] DOB: June 07, 1981 DOA: 07/09/2023 PCP: [name], NP   Brief Narrative: Patient is a 42 year old male with history of ESRD on dialysis, insulin-dependent diabetes mellitus, gastroparesis, left AKA, hypertension who was brought here on 10/11 with altered mental status.  He was recently admitted here on July for sepsis secondary to right diabetic foot ulcer, cultures grew MRSA.  Dialysis catheter was removed and replaced.  He was treated with vancomycin, refused echo and left on 7/7 against  medical advice.  On 10/11, he was found to be confused and brought to ED.  When he was brought in, dialysis catheter was partially removed.  He was also found to be in DKA.  He started on insulin drip.  Lab work showed elevated leukocytes, lactate of 2.4.  He was hypotensive.  Admitted for septic shock.  Hospital course remarkable for finding of MRSA in blood cultures.  ID following.  Nephrology following for dialysis.  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  Assessment & Plan:  Principal Problem:   MRSA bacteremia Active Problems:   DKA (diabetic ketoacidosis) ([name])   Altered mental status   Hemodialysis catheter infection ([name])   Right elbow pain  Septic shock due to MRSA bacteremia: Was admitted on July and was found to have MRSA bacteremia and left AMA.  Presented with septic shock with hypotension, leukocytosis, encephalopathy.  Blood cultures again showing MRSA.  Currently on vancomycin.  Suspected HD line infection.ID following.  May need TEE to rule out infective endocarditis. He spiked fever of 101 F this morning.   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  Possible filamentous structure on the ventricular side of the mitral valve,  thickened aortic valve leaflets/annulus .  Monitor input/output.  Will get cardiology consultation.  His last echo on 03/2019 showed normal ejection fraction  DKA: A1c of 8.9.  Initially started on insulin drip, now transition to long-acting and sliding scale.  Monitor blood sugars.  Diabetic coordinator following  Acute encephalopathy: Likely secondary to sepsis, DKA and hospital-acquired delirium..  Mental status improving.  Avoid sedation.  Today he is alert, awake, obeys commands, mostly oriented  ESRD on dialysis: Dialysis catheter were partially out when he presented.  Suspected as line infection.  Dialysis catheter removed.  Now has temporary femoral dialysis catheter .nephrology following. Marland Kitchen  Receiving dialysis.  Diarrhea: C. difficile ruled out  Electrolyte imbalances: Continue to monitor and supplement as needed.  Anemia/thrombocytopenia: Likely associated  with critical illness, ESRD.  Monitor.  Abnormal CT abdomen/pelvis: Imaging showed heterogenous somewhat geographic area of low density within the anterior liver near falciform ligament, measures about 7 x 4 cm. Possible geographic steatosis versus liver mass. 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  Will do PT/OT assessment when possible.  Patient says he works at [employer]      DVT prophylaxis:heparin injection 5,000 Units Start: 07/10/23 0600     Code Status: Full Code  Family Communication: None at the bedside  Patient status:Inpatient  Patient is from :Home  Anticipated discharge to:not sure  Estimated DC date:not sure   Consultants: ID,PCCM,nephrology  Procedures:Dialysis catheter placement  Antimicrobials:  Anti-infectives (From admission, onward)    Start  PROGRESS NOTE  [name]  MVH:[id number] DOB: June 07, 1981 DOA: 07/09/2023 PCP: [name], NP   Brief Narrative: Patient is a 42 year old male with history of ESRD on dialysis, insulin-dependent diabetes mellitus, gastroparesis, left AKA, hypertension who was brought here on 10/11 with altered mental status.  He was recently admitted here on July for sepsis secondary to right diabetic foot ulcer, cultures grew MRSA.  Dialysis catheter was removed and replaced.  He was treated with vancomycin, refused echo and left on 7/7 against  medical advice.  On 10/11, he was found to be confused and brought to ED.  When he was brought in, dialysis catheter was partially removed.  He was also found to be in DKA.  He started on insulin drip.  Lab work showed elevated leukocytes, lactate of 2.4.  He was hypotensive.  Admitted for septic shock.  Hospital course remarkable for finding of MRSA in blood cultures.  ID following.  Nephrology following for dialysis.  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  Assessment & Plan:  Principal Problem:   MRSA bacteremia Active Problems:   DKA (diabetic ketoacidosis) ([name])   Altered mental status   Hemodialysis catheter infection ([name])   Right elbow pain  Septic shock due to MRSA bacteremia: Was admitted on July and was found to have MRSA bacteremia and left AMA.  Presented with septic shock with hypotension, leukocytosis, encephalopathy.  Blood cultures again showing MRSA.  Currently on vancomycin.  Suspected HD line infection.ID following.  May need TEE to rule out infective endocarditis. He spiked fever of 101 F this morning.   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  Possible filamentous structure on the ventricular side of the mitral valve,  thickened aortic valve leaflets/annulus .  Monitor input/output.  Will get cardiology consultation.  His last echo on 03/2019 showed normal ejection fraction  DKA: A1c of 8.9.  Initially started on insulin drip, now transition to long-acting and sliding scale.  Monitor blood sugars.  Diabetic coordinator following  Acute encephalopathy: Likely secondary to sepsis, DKA and hospital-acquired delirium..  Mental status improving.  Avoid sedation.  Today he is alert, awake, obeys commands, mostly oriented  ESRD on dialysis: Dialysis catheter were partially out when he presented.  Suspected as line infection.  Dialysis catheter removed.  Now has temporary femoral dialysis catheter .nephrology following. Marland Kitchen  Receiving dialysis.  Diarrhea: C. difficile ruled out  Electrolyte imbalances: Continue to monitor and supplement as needed.  Anemia/thrombocytopenia: Likely associated  with critical illness, ESRD.  Monitor.  Abnormal CT abdomen/pelvis: Imaging showed heterogenous somewhat geographic area of low density within the anterior liver near falciform ligament, measures about 7 x 4 cm. Possible geographic steatosis versus liver mass. 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  Will do PT/OT assessment when possible.  Patient says he works at [employer]      DVT prophylaxis:heparin injection 5,000 Units Start: 07/10/23 0600     Code Status: Full Code  Family Communication: None at the bedside  Patient status:Inpatient  Patient is from :Home  Anticipated discharge to:not sure  Estimated DC date:not sure   Consultants: ID,PCCM,nephrology  Procedures:Dialysis catheter placement  Antimicrobials:  Anti-infectives (From admission, onward)    Start  PROGRESS NOTE  [name]  MVH:[id number] DOB: June 07, 1981 DOA: 07/09/2023 PCP: [name], NP   Brief Narrative: Patient is a 42 year old male with history of ESRD on dialysis, insulin-dependent diabetes mellitus, gastroparesis, left AKA, hypertension who was brought here on 10/11 with altered mental status.  He was recently admitted here on July for sepsis secondary to right diabetic foot ulcer, cultures grew MRSA.  Dialysis catheter was removed and replaced.  He was treated with vancomycin, refused echo and left on 7/7 against  medical advice.  On 10/11, he was found to be confused and brought to ED.  When he was brought in, dialysis catheter was partially removed.  He was also found to be in DKA.  He started on insulin drip.  Lab work showed elevated leukocytes, lactate of 2.4.  He was hypotensive.  Admitted for septic shock.  Hospital course remarkable for finding of MRSA in blood cultures.  ID following.  Nephrology following for dialysis.  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  Assessment & Plan:  Principal Problem:   MRSA bacteremia Active Problems:   DKA (diabetic ketoacidosis) ([name])   Altered mental status   Hemodialysis catheter infection ([name])   Right elbow pain  Septic shock due to MRSA bacteremia: Was admitted on July and was found to have MRSA bacteremia and left AMA.  Presented with septic shock with hypotension, leukocytosis, encephalopathy.  Blood cultures again showing MRSA.  Currently on vancomycin.  Suspected HD line infection.ID following.  May need TEE to rule out infective endocarditis. He spiked fever of 101 F this morning.   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  Possible filamentous structure on the ventricular side of the mitral valve,  thickened aortic valve leaflets/annulus .  Monitor input/output.  Will get cardiology consultation.  His last echo on 03/2019 showed normal ejection fraction  DKA: A1c of 8.9.  Initially started on insulin drip, now transition to long-acting and sliding scale.  Monitor blood sugars.  Diabetic coordinator following  Acute encephalopathy: Likely secondary to sepsis, DKA and hospital-acquired delirium..  Mental status improving.  Avoid sedation.  Today he is alert, awake, obeys commands, mostly oriented  ESRD on dialysis: Dialysis catheter were partially out when he presented.  Suspected as line infection.  Dialysis catheter removed.  Now has temporary femoral dialysis catheter .nephrology following. Marland Kitchen  Receiving dialysis.  Diarrhea: C. difficile ruled out  Electrolyte imbalances: Continue to monitor and supplement as needed.  Anemia/thrombocytopenia: Likely associated  with critical illness, ESRD.  Monitor.  Abnormal CT abdomen/pelvis: Imaging showed heterogenous somewhat geographic area of low density within the anterior liver near falciform ligament, measures about 7 x 4 cm. Possible geographic steatosis versus liver mass. 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  Will do PT/OT assessment when possible.  Patient says he works at [employer]      DVT prophylaxis:heparin injection 5,000 Units Start: 07/10/23 0600     Code Status: Full Code  Family Communication: None at the bedside  Patient status:Inpatient  Patient is from :Home  Anticipated discharge to:not sure  Estimated DC date:not sure   Consultants: ID,PCCM,nephrology  Procedures:Dialysis catheter placement  Antimicrobials:  Anti-infectives (From admission, onward)    Start  PROGRESS NOTE  [name]  MVH:[id number] DOB: June 07, 1981 DOA: 07/09/2023 PCP: [name], NP   Brief Narrative: Patient is a 42 year old male with history of ESRD on dialysis, insulin-dependent diabetes mellitus, gastroparesis, left AKA, hypertension who was brought here on 10/11 with altered mental status.  He was recently admitted here on July for sepsis secondary to right diabetic foot ulcer, cultures grew MRSA.  Dialysis catheter was removed and replaced.  He was treated with vancomycin, refused echo and left on 7/7 against  medical advice.  On 10/11, he was found to be confused and brought to ED.  When he was brought in, dialysis catheter was partially removed.  He was also found to be in DKA.  He started on insulin drip.  Lab work showed elevated leukocytes, lactate of 2.4.  He was hypotensive.  Admitted for septic shock.  Hospital course remarkable for finding of MRSA in blood cultures.  ID following.  Nephrology following for dialysis.  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  Assessment & Plan:  Principal Problem:   MRSA bacteremia Active Problems:   DKA (diabetic ketoacidosis) ([name])   Altered mental status   Hemodialysis catheter infection ([name])   Right elbow pain  Septic shock due to MRSA bacteremia: Was admitted on July and was found to have MRSA bacteremia and left AMA.  Presented with septic shock with hypotension, leukocytosis, encephalopathy.  Blood cultures again showing MRSA.  Currently on vancomycin.  Suspected HD line infection.ID following.  May need TEE to rule out infective endocarditis. He spiked fever of 101 F this morning.   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  Possible filamentous structure on the ventricular side of the mitral valve,  thickened aortic valve leaflets/annulus .  Monitor input/output.  Will get cardiology consultation.  His last echo on 03/2019 showed normal ejection fraction  DKA: A1c of 8.9.  Initially started on insulin drip, now transition to long-acting and sliding scale.  Monitor blood sugars.  Diabetic coordinator following  Acute encephalopathy: Likely secondary to sepsis, DKA and hospital-acquired delirium..  Mental status improving.  Avoid sedation.  Today he is alert, awake, obeys commands, mostly oriented  ESRD on dialysis: Dialysis catheter were partially out when he presented.  Suspected as line infection.  Dialysis catheter removed.  Now has temporary femoral dialysis catheter .nephrology following. Marland Kitchen  Receiving dialysis.  Diarrhea: C. difficile ruled out  Electrolyte imbalances: Continue to monitor and supplement as needed.  Anemia/thrombocytopenia: Likely associated  with critical illness, ESRD.  Monitor.  Abnormal CT abdomen/pelvis: Imaging showed heterogenous somewhat geographic area of low density within the anterior liver near falciform ligament, measures about 7 x 4 cm. Possible geographic steatosis versus liver mass. 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  Will do PT/OT assessment when possible.  Patient says he works at [employer]      DVT prophylaxis:heparin injection 5,000 Units Start: 07/10/23 0600     Code Status: Full Code  Family Communication: None at the bedside  Patient status:Inpatient  Patient is from :Home  Anticipated discharge to:not sure  Estimated DC date:not sure   Consultants: ID,PCCM,nephrology  Procedures:Dialysis catheter placement  Antimicrobials:  Anti-infectives (From admission, onward)    Start

## 2023-07-13 NOTE — Progress Notes (Signed)
[facility]  Date of Admission:  07/09/2023     Total days of antibiotics 5         ASSESSMENT:  [name] blood cultures from 07/11/23 remain without growth to date and leukocytosis is improving although continues to be febrile. Suspect that he was still bacteremic during [facility] insertion on 10/12 which is likely contributing to his continuing fevers and would recommend line holiday when appropriate. Have requested TEE to rule out endocarditis. Tolerating vancomycin with dialysis and no adverse side effects. Continue to monitor cultures for clearance of bacteremia. Discontinue enteric precautions as with negative C. Diff toxin and PCR and continue contact precautions. Remaining medical and supportive per Internal Medicine.   PLAN:  Continue current dose of vancomycin with dialysis.  Monitor blood cultures for clearance of bacteremia.  Recommend removal of right femoral TDC and line holiday  Discontinue Enteric precautions and continue contact precautions.  Remaining medical and supportive care per Internal Medicine and Nephrology.    I have personally spent 25 minutes involved in face-to-face and non-face-to-face activities for this patient on the day of the visit. Professional time spent includes the following activities: Preparing to see the patient (review of tests), Obtaining and/or reviewing separately obtained history (admission/discharge record), Performing a medically appropriate examination and/or evaluation , Ordering medications/tests/procedures, referring and communicating with other health care professionals, Documenting clinical information in the EMR, Independently interpreting results (not separately reported), Communicating results to the patient/family/caregiver, Counseling and educating the patient/family/caregiver and Care coordination (not separately reported).   Principal Problem:   MRSA bacteremia Active Problems:   DKA (diabetic  ketoacidosis) ([name])   Altered mental status   Hemodialysis catheter infection ([name])   Right elbow pain    calcitRIOL  1.5 mcg Oral Q M,W,F-HD   Chlorhexidine Gluconate Cloth  6 each Topical Q0600   cinacalcet  120 mg Oral Q supper   famotidine  10 mg Oral Daily   feeding supplement (GLUCERNA SHAKE)  237 mL Oral TID BM   gabapentin  100 mg Oral TID   heparin  5,000 Units Subcutaneous Q8H   insulin aspart  0-6 Units Subcutaneous TID WC   insulin glargine-yfgn  10 Units Subcutaneous Daily   multivitamin with minerals  1 tablet Oral QHS   mupirocin ointment  1 Application Nasal BID   sevelamer carbonate  2,400 mg Oral TID WC    SUBJECTIVE:  Febrile overnight with max temperature of 101 F. No acute events overnight. Unclear as to previous events and is worried about his blood sugars.   No Known Allergies   Review of Systems: Review of Systems  Constitutional:  Negative for chills, fever and weight loss.  Respiratory:  Negative for cough, shortness of breath and wheezing.   Cardiovascular:  Negative for chest pain and leg swelling.  Gastrointestinal:  Negative for abdominal pain, constipation, diarrhea, nausea and vomiting.  Skin:  Negative for rash.      OBJECTIVE: Vitals:   07/13/23 0318 07/13/23 0730 07/13/23 0754 07/13/23 1316  BP: 114/74  106/73 128/76  Pulse: (!) 111  94 95  Resp:    18  Temp: (!) 101 F (38.3 C) 99.2 F (37.3 C) 98.6 F (37 C) 97.8 F (36.6 C)  TempSrc: Oral Axillary Oral Oral  SpO2: 94%  99% 100%  Weight:       Body mass index is 26.14 kg/m.  Physical Exam Constitutional:      General: He is not in acute distress.  Appearance: He is well-developed.  Cardiovascular:     Rate and Rhythm: Normal rate and regular rhythm.     Heart sounds: Normal heart sounds.     Comments: Right femoral TDC Pulmonary:     Effort: Pulmonary effort is normal.     Breath sounds: Normal breath sounds.  Skin:    General: Skin is warm and dry.   Neurological:     Mental Status: He is alert. He is disoriented.     Lab Results Lab Results  Component Value Date   WBC 12.1 (H) 07/12/2023   HGB 10.0 (L) 07/12/2023   HCT 29.9 (L) 07/12/2023   MCV 82.4 07/12/2023   PLT 93 (L) 07/12/2023    Lab Results  Component Value Date   CREATININE 10.30 (H) 07/13/2023   BUN 51 (H) 07/13/2023   NA 132 (L) 07/13/2023   K 3.6 07/13/2023   CL 95 (L) 07/13/2023   CO2 21 (L) 07/13/2023    Lab Results  Component Value Date   ALT 16 07/10/2023   AST 29 07/10/2023   ALKPHOS 176 (H) 07/10/2023   BILITOT 2.2 (H) 07/10/2023     Microbiology: Recent Results (from the past 240 hour(s))  Culture, blood (routine x 2)     Status: Abnormal   Collection Time: 07/10/23 12:15 AM   Specimen: BLOOD  Result Value Ref Range Status   Specimen Description BLOOD BLOOD RIGHT ARM  Final   Special Requests   Final    BOTTLES DRAWN AEROBIC AND ANAEROBIC Blood Culture results may not be optimal due to an excessive volume of blood received in culture bottles   Culture  Setup Time   Final    GRAM POSITIVE COCCI IN CLUSTERS IN BOTH AEROBIC AND ANAEROBIC BOTTLES CRITICAL RESULT CALLED TO, READ BACK BY AND VERIFIED WITH: Valarie Merino ZHOU [id number] AT 1234 BY EC Performed at [facility], [address], [place], [name] [address]    Culture METHICILLIN RESISTANT STAPHYLOCOCCUS AUREUS (A)  Final   Report Status 07/12/2023 FINAL  Final   Organism ID, Bacteria METHICILLIN RESISTANT STAPHYLOCOCCUS AUREUS  Final      Susceptibility   Methicillin resistant staphylococcus aureus - MIC*    CIPROFLOXACIN >=8 RESISTANT Resistant     ERYTHROMYCIN >=8 RESISTANT Resistant     GENTAMICIN <=0.5 SENSITIVE Sensitive     OXACILLIN >=4 RESISTANT Resistant     TETRACYCLINE <=1 SENSITIVE Sensitive     VANCOMYCIN 1 SENSITIVE Sensitive     TRIMETH/SULFA <=10 SENSITIVE Sensitive     CLINDAMYCIN <=0.25 SENSITIVE Sensitive     RIFAMPIN <=0.5 SENSITIVE Sensitive      Inducible Clindamycin NEGATIVE Sensitive     LINEZOLID 2 SENSITIVE Sensitive     * METHICILLIN RESISTANT STAPHYLOCOCCUS AUREUS  Blood Culture ID Panel (Reflexed)     Status: Abnormal   Collection Time: 07/10/23 12:15 AM  Result Value Ref Range Status   Enterococcus faecalis NOT DETECTED NOT DETECTED Final   Enterococcus Faecium NOT DETECTED NOT DETECTED Final   Listeria monocytogenes NOT DETECTED NOT DETECTED Final   Staphylococcus species DETECTED (A) NOT DETECTED Final    Comment: CRITICAL RESULT CALLED TO, READ BACK BY AND VERIFIED WITH: PHARMD JENNY ZHOU [id number] AT 1234 BY EC    Staphylococcus aureus (BCID) DETECTED (A) NOT DETECTED Final    Comment: Methicillin (oxacillin)-resistant Staphylococcus aureus (MRSA). MRSA is predictably resistant to beta-lactam antibiotics (except ceftaroline). Preferred therapy is vancomycin unless clinically contraindicated. Patient requires contact precautions if  hospitalized. CRITICAL RESULT CALLED TO, READ BACK BY AND VERIFIED WITH: PHARMD JENNY ZHOU [id number] AT 1234 BY EC    Staphylococcus epidermidis NOT DETECTED NOT DETECTED Final   Staphylococcus lugdunensis NOT DETECTED NOT DETECTED Final   Streptococcus species NOT DETECTED NOT DETECTED Final   Streptococcus agalactiae NOT DETECTED NOT DETECTED Final   Streptococcus pneumoniae NOT DETECTED NOT DETECTED Final   Streptococcus pyogenes NOT DETECTED NOT DETECTED Final   A.calcoaceticus-baumannii NOT DETECTED NOT DETECTED Final   Bacteroides fragilis NOT DETECTED NOT DETECTED Final   Enterobacterales NOT DETECTED NOT DETECTED Final   Enterobacter cloacae complex NOT DETECTED NOT DETECTED Final   Escherichia coli NOT DETECTED NOT DETECTED Final   Klebsiella aerogenes NOT DETECTED NOT DETECTED Final   Klebsiella oxytoca NOT DETECTED NOT DETECTED Final   Klebsiella pneumoniae NOT DETECTED NOT DETECTED Final   Proteus species NOT DETECTED NOT DETECTED Final   Salmonella species NOT DETECTED NOT  DETECTED Final   Serratia marcescens NOT DETECTED NOT DETECTED Final   Haemophilus influenzae NOT DETECTED NOT DETECTED Final   Neisseria meningitidis NOT DETECTED NOT DETECTED Final   Pseudomonas aeruginosa NOT DETECTED NOT DETECTED Final   Stenotrophomonas maltophilia NOT DETECTED NOT DETECTED Final   Candida albicans NOT DETECTED NOT DETECTED Final   Candida auris NOT DETECTED NOT DETECTED Final   Candida glabrata NOT DETECTED NOT DETECTED Final   Candida krusei NOT DETECTED NOT DETECTED Final   Candida parapsilosis NOT DETECTED NOT DETECTED Final   Candida tropicalis NOT DETECTED NOT DETECTED Final   Cryptococcus neoformans/gattii NOT DETECTED NOT DETECTED Final   Meth resistant mecA/C and MREJ DETECTED (A) NOT DETECTED Final    Comment: CRITICAL RESULT CALLED TO, READ BACK BY AND VERIFIED WITH: Valarie Merino ZHOU [id number] AT 1234 BY EC Performed at [facility], [address], [place], [name] [address]   Culture, blood (routine x 2)     Status: Abnormal   Collection Time: 07/10/23 12:16 AM   Specimen: BLOOD  Result Value Ref Range Status   Specimen Description BLOOD BLOOD RIGHT HAND  Final   Special Requests   Final    BOTTLES DRAWN AEROBIC AND ANAEROBIC Blood Culture results may not be optimal due to an excessive volume of blood received in culture bottles   Culture  Setup Time   Final    GRAM POSITIVE COCCI IN CLUSTERS IN BOTH AEROBIC AND ANAEROBIC BOTTLES CRITICAL VALUE NOTED.  VALUE IS CONSISTENT WITH PREVIOUSLY REPORTED AND CALLED VALUE.    Culture (A)  Final    STAPHYLOCOCCUS AUREUS SUSCEPTIBILITIES PERFORMED ON PREVIOUS CULTURE WITHIN THE LAST 5 DAYS. Performed at [facility], [address] [place], [name] [address]    Report Status 07/12/2023 FINAL  Final  SARS Coronavirus 2 by RT PCR (hospital order, performed in [facility] hospital lab) *cepheid single result test*     Status: None   Collection Time: 07/10/23 12:17 AM   Specimen: Nasal Swab   Result Value Ref Range Status   SARS Coronavirus 2 by RT PCR NEGATIVE NEGATIVE Final    Comment: Performed at [facility], [address], [place], [name] [address]  MRSA Next Gen by PCR, Nasal     Status: Abnormal   Collection Time: 07/10/23  3:26 AM   Specimen: Nasal Mucosa; Nasal Swab  Result Value Ref Range Status   MRSA by PCR Next Gen DETECTED (A) NOT DETECTED Final    Comment: RESULT CALLED TO, READ BACK BY  AND VERIFIED WITH: B CUMMINGS,RN@[id number]  07/10/23 MK (NOTE) The GeneXpert MRSA Assay (FDA approved for NASAL specimens only), is one component of a comprehensive MRSA colonization surveillance program. It is not intended to diagnose MRSA infection nor to guide or monitor treatment for MRSA infections. Test performance is not FDA approved in patients less than 40 years old. Performed at [facility], [address], [place], [name] [address]   C Difficile Quick Screen w PCR reflex     Status: Abnormal   Collection Time: 07/10/23  8:32 AM   Specimen: STOOL  Result Value Ref Range Status   C Diff antigen POSITIVE (A) NEGATIVE Final   C Diff toxin NEGATIVE NEGATIVE Final   C Diff interpretation Results are indeterminate. See PCR results.  Final    Comment: Performed at [facility], [address] [place], [name] [address]  C. Diff by PCR, Reflexed     Status: None   Collection Time: 07/10/23  8:32 AM  Result Value Ref Range Status   Toxigenic C. Difficile by PCR NEGATIVE NEGATIVE Final    Comment: Patient is colonized with non toxigenic C. difficile. May not need treatment unless significant symptoms are present. Performed at [facility], [address] [place], [name] [address]   Culture, blood (Routine X 2) w Reflex to ID Panel     Status: None (Preliminary result)   Collection Time: 07/11/23  8:21 AM   Specimen: BLOOD LEFT ARM  Result Value Ref Range Status   Specimen Description BLOOD LEFT ARM  Final   Special Requests   Final     BOTTLES DRAWN AEROBIC AND ANAEROBIC Blood Culture adequate volume   Culture   Final    NO GROWTH 2 DAYS Performed at [facility], [address] [place], [name] [address]    Report Status PENDING  Incomplete  Culture, blood (Routine X 2) w Reflex to ID Panel     Status: None (Preliminary result)   Collection Time: 07/11/23  8:44 AM   Specimen: BLOOD RIGHT ARM  Result Value Ref Range Status   Specimen Description BLOOD RIGHT ARM  Final   Special Requests   Final    BOTTLES DRAWN AEROBIC ONLY Blood Culture adequate volume   Culture   Final    NO GROWTH 2 DAYS Performed at [facility], [address] [place], [name] [address]    Report Status PENDING  Incomplete     Marcos Eke, NP [facility]  07/13/2023  1:36 PM

## 2023-07-13 NOTE — Consult Note (Addendum)
is no evidence of a large elbow joint effusion. No fracture or dislocation. Joint spaces are maintained. Extensive vascular calcifications, severely advanced for age. No focal soft tissue swelling. IMPRESSION: 1. No acute osseous abnormality of the right elbow. 2. Extensive vascular calcifications, severely advanced for age. Electronically Signed   By: Duanne Guess D.O.   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 There is no acute fracture or dislocation. No cortical erosions are identified. There is soft tissue swelling of the midfoot. Peripheral vascular calcifications are again noted. IMPRESSION: 1. No acute fracture or dislocation. 2. 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 Electronically Signed   By: Darliss Cheney M.D.   On: 07/12/2023 19:25   ECHOCARDIOGRAM COMPLETE  Result Date: 07/11/2023    ECHOCARDIOGRAM REPORT   Patient Name:   [name] Date of Exam: 07/11/2023 Medical Rec #:  [id number]     Height:       67.0 in Accession #:    [id number]    Weight:       169.8 lb Date of Birth:  September 16, 1981    BSA:          1.886 m Patient Age:    42 years      BP:           98/72 mmHg Patient Gender: M             HR:           72 bpm. Exam Location:  Inpatient Procedure: 2D Echo, Color Doppler, Cardiac Doppler and  Intracardiac            Opacification Agent Indications:    Bacteremia R78.81  History:        Patient has prior history of Echocardiogram examinations, most                 recent 04/26/2019. Risk Factors:Hypertension, Diabetes and ESRD.  Sonographer:    Irving Burton Senior RDCS Referring Phys: Encarnacion Chu COMER  Sonographer Comments: Technically difficult due to poor echo windows IMPRESSIONS  1. Left ventricular ejection fraction, by estimation, is 25 to 30%. Left ventricular ejection fraction by PLAX is 30 %. The left ventricle has severely decreased function. The left ventricle demonstrates global hypokinesis. Left ventricular diastolic parameters are consistent with Grade I diastolic dysfunction (impaired relaxation).  2. Right ventricular systolic function is moderately reduced. The right ventricular size is normal. There is normal pulmonary artery systolic pressure.  3. Possible filamentous structure on the ventricular side of the mitral valve (image 54). The mitral valve is abnormal. Trivial mitral valve regurgitation.  4. Aortic Valve leaflets and annulus appear thickened, would recommend TEE to assess for endocarditis in the setting of bacteremia. The aortic valve is abnormal. Aortic valve regurgitation is not visualized.  5. The inferior vena cava is normal in size with <50% respiratory variability, suggesting right atrial pressure of 8 mmHg. Conclusion(s)/Recommendation(s): EF significantly declined compared to prior study. FINDINGS  Left Ventricle: Left ventricular ejection fraction, by estimation, is 25 to 30%. Left ventricular ejection fraction by PLAX is 30 %. The left ventricle has severely decreased function. The left ventricle demonstrates global hypokinesis. Definity contrast agent was given IV to delineate the left ventricular endocardial  is no evidence of a large elbow joint effusion. No fracture or dislocation. Joint spaces are maintained. Extensive vascular calcifications, severely advanced for age. No focal soft tissue swelling. IMPRESSION: 1. No acute osseous abnormality of the right elbow. 2. Extensive vascular calcifications, severely advanced for age. Electronically Signed   By: Duanne Guess D.O.   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 There is no acute fracture or dislocation. No cortical erosions are identified. There is soft tissue swelling of the midfoot. Peripheral vascular calcifications are again noted. IMPRESSION: 1. No acute fracture or dislocation. 2. 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 Electronically Signed   By: Darliss Cheney M.D.   On: 07/12/2023 19:25   ECHOCARDIOGRAM COMPLETE  Result Date: 07/11/2023    ECHOCARDIOGRAM REPORT   Patient Name:   [name] Date of Exam: 07/11/2023 Medical Rec #:  [id number]     Height:       67.0 in Accession #:    [id number]    Weight:       169.8 lb Date of Birth:  September 16, 1981    BSA:          1.886 m Patient Age:    42 years      BP:           98/72 mmHg Patient Gender: M             HR:           72 bpm. Exam Location:  Inpatient Procedure: 2D Echo, Color Doppler, Cardiac Doppler and  Intracardiac            Opacification Agent Indications:    Bacteremia R78.81  History:        Patient has prior history of Echocardiogram examinations, most                 recent 04/26/2019. Risk Factors:Hypertension, Diabetes and ESRD.  Sonographer:    Irving Burton Senior RDCS Referring Phys: Encarnacion Chu COMER  Sonographer Comments: Technically difficult due to poor echo windows IMPRESSIONS  1. Left ventricular ejection fraction, by estimation, is 25 to 30%. Left ventricular ejection fraction by PLAX is 30 %. The left ventricle has severely decreased function. The left ventricle demonstrates global hypokinesis. Left ventricular diastolic parameters are consistent with Grade I diastolic dysfunction (impaired relaxation).  2. Right ventricular systolic function is moderately reduced. The right ventricular size is normal. There is normal pulmonary artery systolic pressure.  3. Possible filamentous structure on the ventricular side of the mitral valve (image 54). The mitral valve is abnormal. Trivial mitral valve regurgitation.  4. Aortic Valve leaflets and annulus appear thickened, would recommend TEE to assess for endocarditis in the setting of bacteremia. The aortic valve is abnormal. Aortic valve regurgitation is not visualized.  5. The inferior vena cava is normal in size with <50% respiratory variability, suggesting right atrial pressure of 8 mmHg. Conclusion(s)/Recommendation(s): EF significantly declined compared to prior study. FINDINGS  Left Ventricle: Left ventricular ejection fraction, by estimation, is 25 to 30%. Left ventricular ejection fraction by PLAX is 30 %. The left ventricle has severely decreased function. The left ventricle demonstrates global hypokinesis. Definity contrast agent was given IV to delineate the left ventricular endocardial  is no evidence of a large elbow joint effusion. No fracture or dislocation. Joint spaces are maintained. Extensive vascular calcifications, severely advanced for age. No focal soft tissue swelling. IMPRESSION: 1. No acute osseous abnormality of the right elbow. 2. Extensive vascular calcifications, severely advanced for age. Electronically Signed   By: Duanne Guess D.O.   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 There is no acute fracture or dislocation. No cortical erosions are identified. There is soft tissue swelling of the midfoot. Peripheral vascular calcifications are again noted. IMPRESSION: 1. No acute fracture or dislocation. 2. 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 Electronically Signed   By: Darliss Cheney M.D.   On: 07/12/2023 19:25   ECHOCARDIOGRAM COMPLETE  Result Date: 07/11/2023    ECHOCARDIOGRAM REPORT   Patient Name:   [name] Date of Exam: 07/11/2023 Medical Rec #:  [id number]     Height:       67.0 in Accession #:    [id number]    Weight:       169.8 lb Date of Birth:  September 16, 1981    BSA:          1.886 m Patient Age:    42 years      BP:           98/72 mmHg Patient Gender: M             HR:           72 bpm. Exam Location:  Inpatient Procedure: 2D Echo, Color Doppler, Cardiac Doppler and  Intracardiac            Opacification Agent Indications:    Bacteremia R78.81  History:        Patient has prior history of Echocardiogram examinations, most                 recent 04/26/2019. Risk Factors:Hypertension, Diabetes and ESRD.  Sonographer:    Irving Burton Senior RDCS Referring Phys: Encarnacion Chu COMER  Sonographer Comments: Technically difficult due to poor echo windows IMPRESSIONS  1. Left ventricular ejection fraction, by estimation, is 25 to 30%. Left ventricular ejection fraction by PLAX is 30 %. The left ventricle has severely decreased function. The left ventricle demonstrates global hypokinesis. Left ventricular diastolic parameters are consistent with Grade I diastolic dysfunction (impaired relaxation).  2. Right ventricular systolic function is moderately reduced. The right ventricular size is normal. There is normal pulmonary artery systolic pressure.  3. Possible filamentous structure on the ventricular side of the mitral valve (image 54). The mitral valve is abnormal. Trivial mitral valve regurgitation.  4. Aortic Valve leaflets and annulus appear thickened, would recommend TEE to assess for endocarditis in the setting of bacteremia. The aortic valve is abnormal. Aortic valve regurgitation is not visualized.  5. The inferior vena cava is normal in size with <50% respiratory variability, suggesting right atrial pressure of 8 mmHg. Conclusion(s)/Recommendation(s): EF significantly declined compared to prior study. FINDINGS  Left Ventricle: Left ventricular ejection fraction, by estimation, is 25 to 30%. Left ventricular ejection fraction by PLAX is 30 %. The left ventricle has severely decreased function. The left ventricle demonstrates global hypokinesis. Definity contrast agent was given IV to delineate the left ventricular endocardial  Cardiology Consultation   Patient ID: MARTINE TRAGESER MRN: [id number]; DOB: 1981-07-22  Admit date: 07/09/2023 Date of Consult: 07/13/2023  PCP:  [name], NP   [facility] Providers Cardiologist:  None        Patient Profile:   [name] is a 42 y.o. male with a hx of DM with ESRD / gastroparesis, foot ulcerations, s/p L AKA, HTN, MRSA bacteremia 03/2023 & anemia who is being seen 07/13/2023 for the evaluation of acute systolic CHF at the request of Dr. Renford Dills.  History of Present Illness:   Mr. 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    His admission 7/12-7/17/24 was notable for sepsis in the setting of R forefoot ulceration.  He was evaluated by Podiatry, treated with IV vancomycin and cefepime. MRI of the right foot at that time was negative for osteomyelitis / abscess. His blood cultures grew MRSA and his HD catheter was removed on 7/15, reinserted tunneled HD 7/17 by IR.  He was planned for 4 weeks of vancomycin.  ECHO was pending, & pt left became agitated and left 7/17 AMA. Nephrology followed up with patient to ensure he had Augmentin for his right foot infection and planned for Vanco during HD. Multiple phone notes reflect frequent calls for percocet and verbal abuse toward staff. On 10/7 he called the office again requesting pain medication refills, & informed staff he had COVID (had not tested).   He presented to the ER on 10/11 after being found down at home by his sister altered. He was last seen 10/9 by his family.  EMS reported scene appeared as if he had fallen out of bed.  He was oriented to self only.  Reported diarrhea x1 week.  EMS reported HD catheter had been partially removed before their arrival. In ER, he was tested for COVID and was negative.  C-Diff antigen positive but PCR & toxin negative. CT of the abdomen demonstrated a  heterogenous area of low density in the anterior liver near the falciform ligament 7x4 cm, indeterminate for steatosis vs liver mass, cardiomegaly.  Given fall, CT head assessed and negative for acute process (motion degraded). He was admitted for further evaluation.  The patient was treated with IV vancomycin.  Blood cultures confirmed MRSA.  ID consulted.  His HD catheter was dislodged and discontinued this admit. A temporary right femoral HD catheter was placed for access. He continued to have fever to 101 despite abx.  Repeat blood cultures from 10/13 remain negative to date. XRay of elbow and right foot ok. ECHO assessment 10/13 showed an LVEF 23-30%, LV has severely decreased function & global hypokinesis, G1DD, RV systolic function moderately reduced, RV size normal, normal PASP, possible filamentous structure on the ventricular side of the mitral valve, the mitral valve is abnormal, trivial MVR, aortic valve leaflets and annulus appear thickened, the AV is abnormal, AVR not visualized. No recent chest pain, SOB.   Cardiology consulted for evaluation of reduced LVEF.    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 No fracture or dislocation. Joint spaces are maintained. Extensive vascular calcifications, severely advanced for age. No focal soft tissue swelling. IMPRESSION: 1. No acute osseous abnormality of the right elbow. 2. Extensive vascular calcifications, severely advanced for age. Electronically Signed   By: Duanne Guess D.O.   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 There is no acute fracture or dislocation. No cortical erosions are identified. There is soft tissue swelling of the midfoot. Peripheral vascular calcifications are again noted. IMPRESSION: 1. No acute fracture or dislocation. 2. 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 Electronically Signed   By: Darliss Cheney M.D.   On: 07/12/2023 19:25   ECHOCARDIOGRAM COMPLETE  Result Date: 07/11/2023    ECHOCARDIOGRAM REPORT   Patient Name:   [name] Date of Exam: 07/11/2023 Medical Rec #:  [id number]     Height:       67.0 in Accession #:    [id number]    Weight:       169.8 lb Date of Birth:  September 16, 1981    BSA:          1.886 m Patient Age:    42 years      BP:           98/72 mmHg Patient Gender: M             HR:           72 bpm. Exam Location:  Inpatient Procedure: 2D Echo, Color Doppler, Cardiac Doppler and  Intracardiac            Opacification Agent Indications:    Bacteremia R78.81  History:        Patient has prior history of Echocardiogram examinations, most                 recent 04/26/2019. Risk Factors:Hypertension, Diabetes and ESRD.  Sonographer:    Irving Burton Senior RDCS Referring Phys: Encarnacion Chu COMER  Sonographer Comments: Technically difficult due to poor echo windows IMPRESSIONS  1. Left ventricular ejection fraction, by estimation, is 25 to 30%. Left ventricular ejection fraction by PLAX is 30 %. The left ventricle has severely decreased function. The left ventricle demonstrates global hypokinesis. Left ventricular diastolic parameters are consistent with Grade I diastolic dysfunction (impaired relaxation).  2. Right ventricular systolic function is moderately reduced. The right ventricular size is normal. There is normal pulmonary artery systolic pressure.  3. Possible filamentous structure on the ventricular side of the mitral valve (image 54). The mitral valve is abnormal. Trivial mitral valve regurgitation.  4. Aortic Valve leaflets and annulus appear thickened, would recommend TEE to assess for endocarditis in the setting of bacteremia. The aortic valve is abnormal. Aortic valve regurgitation is not visualized.  5. The inferior vena cava is normal in size with <50% respiratory variability, suggesting right atrial pressure of 8 mmHg. Conclusion(s)/Recommendation(s): EF significantly declined compared to prior study. FINDINGS  Left Ventricle: Left ventricular ejection fraction, by estimation, is 25 to 30%. Left ventricular ejection fraction by PLAX is 30 %. The left ventricle has severely decreased function. The left ventricle demonstrates global hypokinesis. Definity contrast agent was given IV to delineate the left ventricular endocardial  is no evidence of a large elbow joint effusion. No fracture or dislocation. Joint spaces are maintained. Extensive vascular calcifications, severely advanced for age. No focal soft tissue swelling. IMPRESSION: 1. No acute osseous abnormality of the right elbow. 2. Extensive vascular calcifications, severely advanced for age. Electronically Signed   By: Duanne Guess D.O.   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 There is no acute fracture or dislocation. No cortical erosions are identified. There is soft tissue swelling of the midfoot. Peripheral vascular calcifications are again noted. IMPRESSION: 1. No acute fracture or dislocation. 2. 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 Electronically Signed   By: Darliss Cheney M.D.   On: 07/12/2023 19:25   ECHOCARDIOGRAM COMPLETE  Result Date: 07/11/2023    ECHOCARDIOGRAM REPORT   Patient Name:   [name] Date of Exam: 07/11/2023 Medical Rec #:  [id number]     Height:       67.0 in Accession #:    [id number]    Weight:       169.8 lb Date of Birth:  September 16, 1981    BSA:          1.886 m Patient Age:    42 years      BP:           98/72 mmHg Patient Gender: M             HR:           72 bpm. Exam Location:  Inpatient Procedure: 2D Echo, Color Doppler, Cardiac Doppler and  Intracardiac            Opacification Agent Indications:    Bacteremia R78.81  History:        Patient has prior history of Echocardiogram examinations, most                 recent 04/26/2019. Risk Factors:Hypertension, Diabetes and ESRD.  Sonographer:    Irving Burton Senior RDCS Referring Phys: Encarnacion Chu COMER  Sonographer Comments: Technically difficult due to poor echo windows IMPRESSIONS  1. Left ventricular ejection fraction, by estimation, is 25 to 30%. Left ventricular ejection fraction by PLAX is 30 %. The left ventricle has severely decreased function. The left ventricle demonstrates global hypokinesis. Left ventricular diastolic parameters are consistent with Grade I diastolic dysfunction (impaired relaxation).  2. Right ventricular systolic function is moderately reduced. The right ventricular size is normal. There is normal pulmonary artery systolic pressure.  3. Possible filamentous structure on the ventricular side of the mitral valve (image 54). The mitral valve is abnormal. Trivial mitral valve regurgitation.  4. Aortic Valve leaflets and annulus appear thickened, would recommend TEE to assess for endocarditis in the setting of bacteremia. The aortic valve is abnormal. Aortic valve regurgitation is not visualized.  5. The inferior vena cava is normal in size with <50% respiratory variability, suggesting right atrial pressure of 8 mmHg. Conclusion(s)/Recommendation(s): EF significantly declined compared to prior study. FINDINGS  Left Ventricle: Left ventricular ejection fraction, by estimation, is 25 to 30%. Left ventricular ejection fraction by PLAX is 30 %. The left ventricle has severely decreased function. The left ventricle demonstrates global hypokinesis. Definity contrast agent was given IV to delineate the left ventricular endocardial  Cardiology Consultation   Patient ID: MARTINE TRAGESER MRN: [id number]; DOB: 1981-07-22  Admit date: 07/09/2023 Date of Consult: 07/13/2023  PCP:  [name], NP   [facility] Providers Cardiologist:  None        Patient Profile:   [name] is a 42 y.o. male with a hx of DM with ESRD / gastroparesis, foot ulcerations, s/p L AKA, HTN, MRSA bacteremia 03/2023 & anemia who is being seen 07/13/2023 for the evaluation of acute systolic CHF at the request of Dr. Renford Dills.  History of Present Illness:   Mr. 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    His admission 7/12-7/17/24 was notable for sepsis in the setting of R forefoot ulceration.  He was evaluated by Podiatry, treated with IV vancomycin and cefepime. MRI of the right foot at that time was negative for osteomyelitis / abscess. His blood cultures grew MRSA and his HD catheter was removed on 7/15, reinserted tunneled HD 7/17 by IR.  He was planned for 4 weeks of vancomycin.  ECHO was pending, & pt left became agitated and left 7/17 AMA. Nephrology followed up with patient to ensure he had Augmentin for his right foot infection and planned for Vanco during HD. Multiple phone notes reflect frequent calls for percocet and verbal abuse toward staff. On 10/7 he called the office again requesting pain medication refills, & informed staff he had COVID (had not tested).   He presented to the ER on 10/11 after being found down at home by his sister altered. He was last seen 10/9 by his family.  EMS reported scene appeared as if he had fallen out of bed.  He was oriented to self only.  Reported diarrhea x1 week.  EMS reported HD catheter had been partially removed before their arrival. In ER, he was tested for COVID and was negative.  C-Diff antigen positive but PCR & toxin negative. CT of the abdomen demonstrated a  heterogenous area of low density in the anterior liver near the falciform ligament 7x4 cm, indeterminate for steatosis vs liver mass, cardiomegaly.  Given fall, CT head assessed and negative for acute process (motion degraded). He was admitted for further evaluation.  The patient was treated with IV vancomycin.  Blood cultures confirmed MRSA.  ID consulted.  His HD catheter was dislodged and discontinued this admit. A temporary right femoral HD catheter was placed for access. He continued to have fever to 101 despite abx.  Repeat blood cultures from 10/13 remain negative to date. XRay of elbow and right foot ok. ECHO assessment 10/13 showed an LVEF 23-30%, LV has severely decreased function & global hypokinesis, G1DD, RV systolic function moderately reduced, RV size normal, normal PASP, possible filamentous structure on the ventricular side of the mitral valve, the mitral valve is abnormal, trivial MVR, aortic valve leaflets and annulus appear thickened, the AV is abnormal, AVR not visualized. No recent chest pain, SOB.   Cardiology consulted for evaluation of reduced LVEF.    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 male with a hx of DM with ESRD / gastroparesis, foot ulcerations, s/p L AKA, HTN, MRSA bacteremia 03/2023 & anemia who is being seen 07/13/2023 for the evaluation of acute systolic CHF at the request of Dr. Renford Dills.  History of Present Illness:   Mr. 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    His admission 7/12-7/17/24 was notable for sepsis in the setting of R forefoot ulceration.  He was evaluated by Podiatry, treated with IV vancomycin and cefepime. MRI of the right foot at that time was negative for osteomyelitis / abscess. His blood cultures grew MRSA and his HD catheter was removed on 7/15, reinserted tunneled HD 7/17 by IR.  He was planned for 4 weeks of vancomycin.  ECHO was pending, & pt left became agitated and left 7/17 AMA. Nephrology followed up with patient to ensure he had Augmentin for his right foot infection and planned for Vanco during HD. Multiple phone notes reflect frequent calls for percocet and verbal abuse toward staff. On 10/7 he called the office again requesting pain medication refills, & informed staff he had COVID (had not tested).   He presented to the ER on 10/11 after being found down at home by his sister altered. He was last seen 10/9 by his family.  EMS reported scene appeared as if he had fallen out of bed.  He was oriented to self only.  Reported diarrhea x1 week.  EMS reported HD catheter had been partially removed before their arrival. In ER, he was tested for COVID and was negative.  C-Diff antigen positive but PCR & toxin negative. CT of the abdomen demonstrated a  heterogenous area of low density in the anterior liver near the falciform ligament 7x4 cm, indeterminate for steatosis vs liver mass, cardiomegaly.  Given fall, CT head assessed and negative for acute process (motion degraded). He was admitted for further evaluation.  The patient was treated with IV vancomycin.  Blood cultures confirmed MRSA.  ID consulted.  His HD catheter was dislodged and discontinued this admit. A temporary right femoral HD catheter was placed for access. He continued to have fever to 101 despite abx.  Repeat blood cultures from 10/13 remain negative to date. XRay of elbow and right foot ok. ECHO assessment 10/13 showed an LVEF 23-30%, LV has severely decreased function & global hypokinesis, G1DD, RV systolic function moderately reduced, RV size normal, normal PASP, possible filamentous structure on the ventricular side of the mitral valve, the mitral valve is abnormal, trivial MVR, aortic valve leaflets and annulus appear thickened, the AV is abnormal, AVR not visualized. No recent chest pain, SOB.   Cardiology consulted for evaluation of reduced LVEF.    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 No fracture or dislocation. Joint spaces are maintained. Extensive vascular calcifications, severely advanced for age. No focal soft tissue swelling. IMPRESSION: 1. No acute osseous abnormality of the right elbow. 2. Extensive vascular calcifications, severely advanced for age. Electronically Signed   By: Duanne Guess D.O.   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 There is no acute fracture or dislocation. No cortical erosions are identified. There is soft tissue swelling of the midfoot. Peripheral vascular calcifications are again noted. IMPRESSION: 1. No acute fracture or dislocation. 2. 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 Electronically Signed   By: Darliss Cheney M.D.   On: 07/12/2023 19:25   ECHOCARDIOGRAM COMPLETE  Result Date: 07/11/2023    ECHOCARDIOGRAM REPORT   Patient Name:   [name] Date of Exam: 07/11/2023 Medical Rec #:  [id number]     Height:       67.0 in Accession #:    [id number]    Weight:       169.8 lb Date of Birth:  September 16, 1981    BSA:          1.886 m Patient Age:    42 years      BP:           98/72 mmHg Patient Gender: M             HR:           72 bpm. Exam Location:  Inpatient Procedure: 2D Echo, Color Doppler, Cardiac Doppler and  Intracardiac            Opacification Agent Indications:    Bacteremia R78.81  History:        Patient has prior history of Echocardiogram examinations, most                 recent 04/26/2019. Risk Factors:Hypertension, Diabetes and ESRD.  Sonographer:    Irving Burton Senior RDCS Referring Phys: Encarnacion Chu COMER  Sonographer Comments: Technically difficult due to poor echo windows IMPRESSIONS  1. Left ventricular ejection fraction, by estimation, is 25 to 30%. Left ventricular ejection fraction by PLAX is 30 %. The left ventricle has severely decreased function. The left ventricle demonstrates global hypokinesis. Left ventricular diastolic parameters are consistent with Grade I diastolic dysfunction (impaired relaxation).  2. Right ventricular systolic function is moderately reduced. The right ventricular size is normal. There is normal pulmonary artery systolic pressure.  3. Possible filamentous structure on the ventricular side of the mitral valve (image 54). The mitral valve is abnormal. Trivial mitral valve regurgitation.  4. Aortic Valve leaflets and annulus appear thickened, would recommend TEE to assess for endocarditis in the setting of bacteremia. The aortic valve is abnormal. Aortic valve regurgitation is not visualized.  5. The inferior vena cava is normal in size with <50% respiratory variability, suggesting right atrial pressure of 8 mmHg. Conclusion(s)/Recommendation(s): EF significantly declined compared to prior study. FINDINGS  Left Ventricle: Left ventricular ejection fraction, by estimation, is 25 to 30%. Left ventricular ejection fraction by PLAX is 30 %. The left ventricle has severely decreased function. The left ventricle demonstrates global hypokinesis. Definity contrast agent was given IV to delineate the left ventricular endocardial  is no evidence of a large elbow joint effusion. No fracture or dislocation. Joint spaces are maintained. Extensive vascular calcifications, severely advanced for age. No focal soft tissue swelling. IMPRESSION: 1. No acute osseous abnormality of the right elbow. 2. Extensive vascular calcifications, severely advanced for age. Electronically Signed   By: Duanne Guess D.O.   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 There is no acute fracture or dislocation. No cortical erosions are identified. There is soft tissue swelling of the midfoot. Peripheral vascular calcifications are again noted. IMPRESSION: 1. No acute fracture or dislocation. 2. 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 Electronically Signed   By: Darliss Cheney M.D.   On: 07/12/2023 19:25   ECHOCARDIOGRAM COMPLETE  Result Date: 07/11/2023    ECHOCARDIOGRAM REPORT   Patient Name:   [name] Date of Exam: 07/11/2023 Medical Rec #:  [id number]     Height:       67.0 in Accession #:    [id number]    Weight:       169.8 lb Date of Birth:  September 16, 1981    BSA:          1.886 m Patient Age:    42 years      BP:           98/72 mmHg Patient Gender: M             HR:           72 bpm. Exam Location:  Inpatient Procedure: 2D Echo, Color Doppler, Cardiac Doppler and  Intracardiac            Opacification Agent Indications:    Bacteremia R78.81  History:        Patient has prior history of Echocardiogram examinations, most                 recent 04/26/2019. Risk Factors:Hypertension, Diabetes and ESRD.  Sonographer:    Irving Burton Senior RDCS Referring Phys: Encarnacion Chu COMER  Sonographer Comments: Technically difficult due to poor echo windows IMPRESSIONS  1. Left ventricular ejection fraction, by estimation, is 25 to 30%. Left ventricular ejection fraction by PLAX is 30 %. The left ventricle has severely decreased function. The left ventricle demonstrates global hypokinesis. Left ventricular diastolic parameters are consistent with Grade I diastolic dysfunction (impaired relaxation).  2. Right ventricular systolic function is moderately reduced. The right ventricular size is normal. There is normal pulmonary artery systolic pressure.  3. Possible filamentous structure on the ventricular side of the mitral valve (image 54). The mitral valve is abnormal. Trivial mitral valve regurgitation.  4. Aortic Valve leaflets and annulus appear thickened, would recommend TEE to assess for endocarditis in the setting of bacteremia. The aortic valve is abnormal. Aortic valve regurgitation is not visualized.  5. The inferior vena cava is normal in size with <50% respiratory variability, suggesting right atrial pressure of 8 mmHg. Conclusion(s)/Recommendation(s): EF significantly declined compared to prior study. FINDINGS  Left Ventricle: Left ventricular ejection fraction, by estimation, is 25 to 30%. Left ventricular ejection fraction by PLAX is 30 %. The left ventricle has severely decreased function. The left ventricle demonstrates global hypokinesis. Definity contrast agent was given IV to delineate the left ventricular endocardial

## 2023-07-13 NOTE — Inpatient Diabetes Management (Signed)
Inpatient Diabetes Program Recommendations  AACE/ADA: New Consensus Statement on Inpatient Glycemic Control (2015)  Target Ranges:  Prepandial:   less than 140 mg/dL      Peak postprandial:   less than 180 mg/dL (1-2 hours)      Critically ill patients:  140 - 180 mg/dL   Lab Results  Component Value Date   GLUCAP 103 (H) 07/13/2023   HGBA1C 8.9 (H) 07/10/2023    Review of Glycemic Control  Latest Reference Range & Units 07/12/23 16:08 07/12/23 17:43 07/12/23 20:57 07/13/23 07:54 07/13/23 09:42  Glucose-Capillary 70 - 99 mg/dL 64 (L) 97 161 (H) 41 (LL) 103 (H)  (LL): Data is critically low  Diabetes history: Type 1 DM Outpatient Diabetes medications: Novolin R 12 units TID, Novolin 70/30 10 units BID Current orders for Inpatient glycemic control: Novolog 0-6 units TID & HS, Semglee 10 units BID   Inpatient Diabetes Program Recommendations:    Noted hypoglycemia this AM of 41 mg/dL following Semglee BID.  Consider adding Semglee 10 units every day.   Thanks, Lujean Rave, MSN, RNC-OB Diabetes Coordinator [phone] (8a-5p)

## 2023-07-13 NOTE — Progress Notes (Signed)
[facility] Progress Note  Subjective: Transferred out of ICU yesterday.  No new complaints.  Desat overnight thought related to aspiration and required suctioning - doing better today.    Vitals:   07/13/23 0005 07/13/23 0318 07/13/23 0730 07/13/23 0754  BP: (!) 142/84 114/74  106/73  Pulse: (!) 103 (!) 111  94  Resp: 20     Temp: 99.6 F (37.6 C) (!) 101 F (38.3 C) 99.2 F (37.3 C) 98.6 F (37 C)  TempSrc: Oral Oral Axillary Oral  SpO2: 90% 94%  99%  Weight:        Exam: Gen alert, no distress, calm in bed No jvd or bruits Chest clear bilat to bases RRR no RG Abd soft ntnd no mass or ascites +bs Ext no pitting edema UE's or LE's Neuro is talking but somewhat confused, no focal deficits noted and more coherent than yesterday    [facility] TDC --> removed this admission, site no issue     Temp cath R femoral vein      Renal-related home meds: - sensipar 120 at bedtime - gabapentin 100mg  - renvavite - renvela 3 ac tid      OP HD: MWF [facility] 4h  400/1.5    77kg   2/3 bath  [facility] TDC    Heparin none - last OP HD 10/7, post wt 74.6kg - coming off 75- 76kg - misses HD once every other week approx - rocaltrol 1.50 mcg  - no esa    CXR 10/12- no edema, no active disease   Assessment/ Plan:  AMS -  Improving - related to DKA and sepsis; suspect now with some delerium it appears but improved Sepsis - hx of MRSA bacteremia in 03/2023. On vanc/ zosyn. BCx's + for MRSA 10/12, 10/13 NGTD at 2 days. ID is following.  X rays of R elbow and foot 10/14 ok.   ESRD - on HD MWF. Tol last HD Monday fine. Plan next Wed. HD access - the [facility] was dislodged and is now out. Suspect this was a tunnel infection. Has temp cath R femoral.  Repeat cultures are neg at 2 days but he did have Tm 101 overnight.  Will hold on obtaining new TDC yet but given repeat blood cultures NGTD will leave temp HD line in for now unless ID recommends removal for line holiday. HTN/ BP - BP's are soft in the  90s- 110 range.  Volume - no vol excess, at his dry wt. CXR clear.  UF with HD.  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 No esa at OP unit. Follow.  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 Resumed po vdra mwf. Resumed binder.     Estill Bakes MD [facility] Pager [phone]   Recent Labs  Lab 07/09/23 2231 07/10/23 0014 07/10/23 0142 07/10/23 0551 07/11/23 0547 07/11/23 1300 07/12/23 0520 07/13/23 0507  HGB 10.2*   < > 10.0*  --  10.2*  --  10.0*  --   ALBUMIN 2.8*  --  2.6*  --   --   --   --   --   CALCIUM 9.5  --  9.0   < > 9.0   < > 8.6* 9.1  PHOS  --   --  7.4*  --   --   --   --   --   CREATININE 16.48*   < > 15.81*   < > 15.34*   < > 8.83* 10.30*  K  5.0   < > 4.8   < > 4.0   < > 3.3* 3.6   < > = values in this interval not displayed.   No results for input(s): "IRON", "TIBC", "FERRITIN" in the last 168 hours. Inpatient medications:  calcitRIOL  1.5 mcg Oral Q M,W,F-HD   Chlorhexidine Gluconate Cloth  6 each Topical Daily   Chlorhexidine Gluconate Cloth  6 each Topical Q0600   Chlorhexidine Gluconate Cloth  6 each Topical Q0600   cinacalcet  120 mg Oral Q supper   famotidine  10 mg Oral Daily   feeding supplement (GLUCERNA SHAKE)  237 mL Oral TID BM   gabapentin  100 mg Oral TID   heparin  5,000 Units Subcutaneous Q8H   insulin aspart  0-5 Units Subcutaneous QHS   insulin aspart  0-6 Units Subcutaneous TID WC   insulin glargine-yfgn  10 Units Subcutaneous BID   multivitamin with minerals  1 tablet Oral QHS   mupirocin ointment  1 Application Nasal BID    anticoagulant sodium citrate     [START ON 07/14/2023] vancomycin      acetaminophen, alteplase, anticoagulant sodium citrate, dextrose, docusate sodium, feeding supplement (NEPRO CARB STEADY), heparin, heparin sodium (porcine), lidocaine (PF), lidocaine-prilocaine, mouth rinse, pentafluoroprop-tetrafluoroeth, polyethylene glycol, promethazine

## 2023-07-13 NOTE — Progress Notes (Signed)
Patient O2 saturation went to 85%, he had a secretions in his throat that he could not expel. He had crackles in his upper chest. Weak cough. Temp of 101. I sent a secure message to Dr. Elinor Parkinson at [phone] who told me to inform the primary team and that they were ID. I sent Dr. Janalyn Shy a secure message at [phone] who replied that the patient is critical care not hospitalist. I then sent Dr. Monia Sabal message, no response. I suctioned the patient. And put him on 4 liter the titrated to 2 liters. Patient's O2 saturation is 95%.  Secure message:Hi. I think the patient has maybe aspirated from for earlier or meds/ water not swallowed. He is making a gurgling sound when breathing. I gave him mouth suction.    I did get some but I think he needs deep suction from respiratory. NPO diet. Maybe chest xray. He does bite the yonker when I attempt mouth suction. He is only alert to person and place. He was found down and has missed some dialysis treatments. Head CT was negative earlier today.

## 2023-07-14 ENCOUNTER — Inpatient Hospital Stay (HOSPITAL_COMMUNITY): Payer: Medicare HMO

## 2023-07-14 ENCOUNTER — Encounter (HOSPITAL_COMMUNITY)
Admission: EM | Disposition: A | Discharge status: Other Acute Inpatient Hospital | Payer: Self-pay | Source: Home / Self Care | Attending: Internal Medicine

## 2023-07-14 DIAGNOSIS — I5021 Acute systolic (congestive) heart failure: Secondary | ICD-10-CM | POA: Diagnosis not present

## 2023-07-14 DIAGNOSIS — R7881 Bacteremia: Secondary | ICD-10-CM

## 2023-07-14 DIAGNOSIS — I059 Rheumatic mitral valve disease, unspecified: Secondary | ICD-10-CM | POA: Diagnosis not present

## 2023-07-14 DIAGNOSIS — N186 End stage renal disease: Secondary | ICD-10-CM | POA: Diagnosis not present

## 2023-07-14 DIAGNOSIS — I339 Acute and subacute endocarditis, unspecified: Secondary | ICD-10-CM | POA: Diagnosis not present

## 2023-07-14 DIAGNOSIS — I340 Nonrheumatic mitral (valve) insufficiency: Secondary | ICD-10-CM

## 2023-07-14 DIAGNOSIS — B9562 Methicillin resistant Staphylococcus aureus infection as the cause of diseases classified elsewhere: Secondary | ICD-10-CM | POA: Diagnosis not present

## 2023-07-14 DIAGNOSIS — I2510 Atherosclerotic heart disease of native coronary artery without angina pectoris: Secondary | ICD-10-CM

## 2023-07-14 DIAGNOSIS — T80211A Bloodstream infection due to central venous catheter, initial encounter: Secondary | ICD-10-CM | POA: Diagnosis not present

## 2023-07-14 DIAGNOSIS — I420 Dilated cardiomyopathy: Secondary | ICD-10-CM

## 2023-07-14 HISTORY — PX: TRANSESOPHAGEAL ECHOCARDIOGRAM (CATH LAB): EP1270

## 2023-07-14 LAB — CBC
HCT: 28.4 % — ABNORMAL LOW (ref 39.0–52.0)
Hemoglobin: 9.2 g/dL — ABNORMAL LOW (ref 13.0–17.0)
MCH: 28.2 pg (ref 26.0–34.0)
MCHC: 32.4 g/dL (ref 30.0–36.0)
MCV: 87.1 fL (ref 80.0–100.0)
Platelets: 108 10*3/uL — ABNORMAL LOW (ref 150–400)
RBC: 3.26 MIL/uL — ABNORMAL LOW (ref 4.22–5.81)
RDW: 14.3 % (ref 11.5–15.5)
WBC: 15.3 10*3/uL — ABNORMAL HIGH (ref 4.0–10.5)
nRBC: 0 % (ref 0.0–0.2)

## 2023-07-14 LAB — BASIC METABOLIC PANEL
Anion gap: 19 — ABNORMAL HIGH (ref 5–15)
BUN: 62 mg/dL — ABNORMAL HIGH (ref 6–20)
CO2: 16 mmol/L — ABNORMAL LOW (ref 22–32)
Calcium: 8.1 mg/dL — ABNORMAL LOW (ref 8.9–10.3)
Chloride: 92 mmol/L — ABNORMAL LOW (ref 98–111)
Creatinine, Ser: 11.7 mg/dL — ABNORMAL HIGH (ref 0.61–1.24)
GFR, Estimated: 5 mL/min — ABNORMAL LOW (ref >60)
Glucose, Bld: 191 mg/dL — ABNORMAL HIGH (ref 70–99)
Potassium: 4.3 mmol/L (ref 3.5–5.1)
Sodium: 127 mmol/L — ABNORMAL LOW (ref 135–145)

## 2023-07-14 LAB — GLUCOSE, CAPILLARY
Glucose-Capillary: 155 mg/dL — ABNORMAL HIGH (ref 70–99)
Glucose-Capillary: 167 mg/dL — ABNORMAL HIGH (ref 70–99)
Glucose-Capillary: 236 mg/dL — ABNORMAL HIGH (ref 70–99)

## 2023-07-14 LAB — ECHO TEE

## 2023-07-14 SURGERY — TRANSESOPHAGEAL ECHOCARDIOGRAM (TEE) (CATHLAB)
Anesthesia: Monitor Anesthesia Care

## 2023-07-14 MED ORDER — SODIUM CHLORIDE 0.9 % IV SOLN: INTRAVENOUS | Status: DC

## 2023-07-14 MED ORDER — PROPOFOL 10 MG/ML IV BOLUS
INTRAVENOUS | Status: DC | PRN
  Administered 2023-07-14: 80 mg via INTRAVENOUS
  Administered 2023-07-14: 20 mg via INTRAVENOUS

## 2023-07-14 MED ORDER — PHENYLEPHRINE 80 MCG/ML (10ML) SYRINGE FOR IV PUSH (FOR BLOOD PRESSURE SUPPORT)
PREFILLED_SYRINGE | INTRAVENOUS | Status: DC | PRN
  Administered 2023-07-14: 160 ug via INTRAVENOUS
  Administered 2023-07-14: 80 ug via INTRAVENOUS
  Administered 2023-07-14: 160 ug via INTRAVENOUS

## 2023-07-14 MED ORDER — PHENYLEPHRINE HCL-NACL 20-0.9 MG/250ML-% IV SOLN
INTRAVENOUS | Status: DC | PRN
  Administered 2023-07-14: 50 ug/min via INTRAVENOUS

## 2023-07-14 MED ORDER — PROPOFOL 500 MG/50ML IV EMUL
INTRAVENOUS | Status: DC | PRN
  Administered 2023-07-14: 75 ug/kg/min via INTRAVENOUS

## 2023-07-14 MED ORDER — LIDOCAINE 2% (20 MG/ML) 5 ML SYRINGE
INTRAMUSCULAR | Status: DC | PRN
  Administered 2023-07-14: 80 mg via INTRAVENOUS

## 2023-07-14 NOTE — Consult Note (Signed)
[address]       [place] [address]             [phone]           [name] [place] Medical Record #[id number] Date of Birth: 05-Jun-1981  No ref. provider found [name], NP  Chief Complaint:    Chief Complaint  Patient presents with   Altered Mental Status    History of Present Illness:     43 yo dialysis dependent male who signed out AMA in July with MRSA bacteremia found to be lethargic and admitted with sepsis.Head Ct negative. Found again with MRSA bacteremia. 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 No significant other valve involvement. EF severely reduced.      Past Medical History:  Diagnosis Date   Anemia    Cellulitis and abscess of toe of left foot    Diabetes mellitus without complication ([name])    Diabetic gastroparesis ([name])    Diabetic ulcer of left midfoot associated with diabetes mellitus due to underlying condition, with necrosis of bone [facility])    Dialysis patient ([name])    ESRD (end stage renal disease) ([name])    Hypertension    Sepsis ([name])     Past Surgical History:  Procedure Laterality Date   AMPUTATION Right 02/02/2018   Procedure: RIGHT FIFTH TOE AND METATARSAL AMPUTATION. Filetted toe flap metatarsal resection. Debridement Plantar Foot wound;  Surgeon: Park Liter, DPM;  Location[facility] OR;  Service: Podiatry;  Laterality: Right;   AMPUTATION Left 08/20/2018   Procedure: FIFTH METATARSAL BONE BIOPSY;  Surgeon: Park Liter, DPM;  Location: [facility] OR;  Service: Podiatry;  Laterality: Left;   AMPUTATION Left 10/28/2018   Procedure: LEFT GREAT TOE AMPUTATION;  Surgeon: Park Liter, DPM;  Location: [facility] OR;  Service: Podiatry;  Laterality: Left;   AMPUTATION Left 09/16/2020   Procedure: AMPUTATION BELOW KNEE;  Surgeon: Larina Earthly, MD;  Location: [facility] OR;  Service: Vascular;  Laterality: Left;   AMPUTATION Left 10/15/2020   Procedure: AMPUTATION ABOVE KNEE;  Surgeon:  Sherren Kerns, MD;  Location: [facility] OR;  Service: Vascular;  Laterality: Left;   APPLICATION OF WOUND VAC  02/02/2018   Procedure: APPLICATION OF WOUND VAC  Right Foot;  Surgeon: Park Liter, DPM;  Location: [facility] OR;  Service: Podiatry;;   APPLICATION OF WOUND VAC Left 10/28/2018   Procedure: APPLICATION OF WOUND VAC LEFT TOE;  Surgeon: Park Liter, DPM;  Location: [facility] OR;  Service: Podiatry;  Laterality: Left;   APPLICATION OF WOUND VAC Left 11/01/2018   Procedure: APPLICATION OF WOUND VAC;  Surgeon: Park Liter, DPM;  Location: [facility] OR;  Service: Podiatry;  Laterality: Left;   AV FISTULA PLACEMENT     left arm.   AV FISTULA PLACEMENT Right 12/22/2016   Procedure: INSERTION OF ARTERIOVENOUS (AV) GORE-TEX GRAFT ARM;  Surgeon: Sherren Kerns, MD;  Location: [facility] OR;  Service: Vascular;  Laterality: Right;   AV FISTULA PLACEMENT Left 05/26/2018   Procedure: INSERTION OF  ARTERIOVENOUS (AV) GORE-TEX GRAFT LEFT ARM;  Surgeon: Nada Libman, MD;  Location: [facility] OR;  Service: Vascular;  Laterality: Left;   EYE SURGERY     I & D EXTREMITY Right 10/31/2017   Procedure: IRRIGATION AND DEBRIDEMENT RIGHT FOOT;  Surgeon: Park Liter, DPM;  Location: [facility] OR;  Service: Podiatry;  Laterality: Right;   I & D EXTREMITY Left  08/20/2018   Procedure: IRRIGATION AND DEBRIDEMENT EXTREMITY WITH SECONDARY WOUND CLOSUREAND APPLICATION OF WOUND VAC LEFT FOOT;  Surgeon: Park Liter, DPM;  Location: [facility] OR;  Service: Podiatry;  Laterality: Left;   I & D EXTREMITY Left 10/20/2018   Procedure: IRRIGATION AND DEBRIDEMENT LEFT FOOT  DEBRIDEMENT LATERAL FOOT WOUND;  Surgeon: Park Liter, DPM;  Location: [facility] OR;  Service: Podiatry;  Laterality: Left;   I & D EXTREMITY Left 10/28/2018   Procedure: IRRIGATION AND DEBRIDEMENT LEFT TOE;  Surgeon: Park Liter, DPM;  [facility] OR;  Service: Podiatry;  Laterality: Left;   I & D EXTREMITY Left 09/14/2020   Procedure: IRRIGATION AND DEBRIDEMENT WRIST;  Surgeon:  Betha Loa, MD;  Location: [facility] OR;  Service: Orthopedics;  Laterality: Left;   INSERTION OF DIALYSIS CATHETER     Right subclavian   IR AV DIALY SHUNT INTRO NEEDLE/INTRACATH INITIAL W/PTA/IMG RIGHT Right 02/05/2018   IR FLUORO GUIDE CV LINE LEFT  04/14/2023   IR FLUORO GUIDE CV LINE RIGHT  01/31/2020   IR FLUORO GUIDE CV LINE RIGHT  01/30/2022   IR PTA VENOUS EXCEPT DIALYSIS CIRCUIT  01/30/2022   IR REMOVAL TUN CV CATH W/O FL  04/12/2023   IR THROMBECTOMY AV FISTULA W/THROMBOLYSIS/PTA INC/SHUNT/IMG LEFT Left 08/24/2018   IR THROMBECTOMY AV FISTULA W/THROMBOLYSIS/PTA INC/SHUNT/IMG LEFT Left 01/06/2019   IR US GUIDE VASC ACCESS LEFT  08/24/2018   IR US GUIDE VASC ACCESS LEFT  04/14/2023   IR US GUIDE VASC ACCESS RIGHT  02/05/2018   IR US GUIDE VASC ACCESS RIGHT  01/31/2020   IR VENOCAVAGRAM SVC  01/30/2022   IRRIGATION AND DEBRIDEMENT FOOT Right 10/23/2018   Procedure: Irrigation and Debridement to tendon, Left Foot;  Surgeon: Park Liter, DPM;  Location: [facility] OR;  Service: Podiatry;  Laterality: Right;   IRRIGATION AND DEBRIDEMENT FOOT Left 11/01/2018   Procedure: IRRIGATION AND DEBRIDEMENT PARTIAL WOUND CLOSURE LOCAL TISSUE TRANSFER AND FLAP ROTATION, LEFT FOOT;  Surgeon: Park Liter, DPM;  [facility]: [facility] OR;  Service: Podiatry;  Laterality: Left;   TRANSMETATARSAL AMPUTATION N/A 08/18/2018   Procedure: IRRIGATION AND DEBRIDEMENT OF LEFT 5TH TOE AND TRANSMETATARSAL, WITH PARTICAL LEFT 5TH TOE AND METATARSAL AMPUTATION, BONE BIOPSY, WOUND VAC APPLICATION.;  Surgeon: Park Liter, DPM;  Location[facility] OR;  Service: Podiatry;  Laterality: N/A;   UPPER EXTREMITY VENOGRAPHY N/A 11/16/2016   Procedure: Upper Extremity Venography - Right Central;  Surgeon: Sherren Kerns, MD;  Location: [facility] INVASIVE CV LAB;  Service: Cardiovascular;  Laterality: N/A;   UPPER EXTREMITY VENOGRAPHY N/A 05/25/2018   Procedure: UPPER EXTREMITY VENOGRAPHY - Bilateral;  Surgeon: Cephus Shelling, MD;  [facility]: [facility] INVASIVE CV  LAB;  Service: Cardiovascular;  Laterality: N/A;    Social History   Tobacco Use  Smoking Status Never  Smokeless Tobacco Never    Social History   Substance and Sexual Activity  Alcohol Use No    Social History   Socioeconomic History   Marital status: Single    Spouse name: Not on file   Number of children: Not on file   Years of education: Not on file   Highest education level: Not on file  Occupational History   Not on file  Tobacco Use   Smoking status: Never   Smokeless tobacco: Never  Vaping Use   Vaping status: Never Used  Substance and Sexual Activity   Alcohol use: No   Drug use: No   Sexual activity: Never  Other Topics  Concern   Not on file  Social History Narrative   Not on file   Social Determinants of Health   Financial Resource Strain: Low Risk  (02/15/2023)   Overall Financial Resource Strain (CARDIA)    Difficulty of Paying Living Expenses: Not hard at all  Food Insecurity: No Food Insecurity (04/14/2023)   Hunger Vital Sign    Worried About Running Out of Food in the Last Year: Never true    Ran Out of Food in the Last Year: Never true  Transportation Needs: No Transportation Needs (04/14/2023)   PRAPARE - Administrator, Civil Service (Medical): No    Lack of Transportation (Non-Medical): No  Physical Activity: Inactive (02/15/2023)   Exercise Vital Sign    Days of Exercise per Week: 0 days    Minutes of Exercise per Session: 0 min  Stress: No Stress Concern Present (02/15/2023)   [employer] of Occupational Health - Occupational Stress Questionnaire    Feeling of Stress : Not at all  Social Connections: Socially Integrated (02/15/2023)   Social Connection and Isolation Panel [NHANES]    Frequency of Communication with Friends and Family: More than three times a week    Frequency of Social Gatherings with Friends and Family: More than three times a week    Attends Religious Services: More than 4 times per year    Active  Member of [employer] or Organizations: Yes    Attends Engineer, structural: More than 4 times per year    Marital Status: Married  Catering manager Violence: Not At Risk (04/14/2023)   Humiliation, Afraid, Rape, and Kick questionnaire    Fear of Current or Ex-Partner: No    Emotionally Abused: No    Physically Abused: No    Sexually Abused: No    No Known Allergies  Current Facility-Administered Medications  Medication Dose Route Frequency Provider Last Rate Last Admin   acetaminophen (TYLENOL) tablet 650 mg  650 mg Oral Q6H PRN Olalere, Adewale A, MD   650 mg at 07/12/23 0153   alteplase (CATHFLO ACTIVASE) injection 2 mg  2 mg Intracatheter Once PRN Delano Metz, MD       anticoagulant sodium citrate solution 5 mL  5 mL Intracatheter PRN Delano Metz, MD       calcitRIOL (ROCALTROL) capsule 1.5 mcg  1.5 mcg Oral Q M,W,F-HD Cheri Fowler, MD   1.5 mcg at 07/12/23 1143   Chlorhexidine Gluconate Cloth 2 % PADS 6 each  6 each Topical Q0600 Delano Metz, MD   6 each at 07/14/23 0512   cinacalcet (SENSIPAR) tablet 120 mg  120 mg Oral Q supper Cheri Fowler, MD   120 mg at 07/13/23 1653   dextrose 50 % solution 0-50 mL  0-50 mL Intravenous PRN Tilden Fossa, MD       docusate sodium (COLACE) capsule 100 mg  100 mg Oral BID PRN Patrici Ranks, MD       famotidine (PEPCID) tablet 10 mg  10 mg Oral Daily Patrici Ranks, MD   10 mg at 07/13/23 6213   feeding supplement (GLUCERNA SHAKE) (GLUCERNA SHAKE) liquid 237 mL  237 mL Oral TID BM Harris, Whitney D, NP   237 mL at 07/13/23 2146   feeding supplement (NEPRO CARB STEADY) liquid 237 mL  237 mL Oral PRN Delano Metz, MD       gabapentin (NEURONTIN) capsule 100 mg  100 mg Oral TID Olalere, Adewale A, MD   100 mg at  07/13/23 2145   heparin injection 1,000 Units  1,000 Units Intracatheter PRN Delano Metz, MD       heparin injection 5,000 Units  5,000 Units Subcutaneous Q8H Patrici Ranks, MD   5,000 Units at 07/14/23 0511    heparin sodium (porcine) injection 1,000-2,000 Units  1,000-2,000 Units Intracatheter PRN Olalere, Adewale A, MD       insulin aspart (novoLOG) injection 0-6 Units  0-6 Units Subcutaneous TID WC Olalere, Adewale A, MD   3 Units at 07/11/23 1758   insulin glargine-yfgn (SEMGLEE) injection 5 Units  5 Units Subcutaneous Daily Adhikari, Amrit, MD       lidocaine (PF) (XYLOCAINE) 1 % injection 5 mL  5 mL Intradermal PRN Delano Metz, MD       lidocaine-prilocaine (EMLA) cream 1 Application  1 Application Topical PRN Delano Metz, MD       multivitamin with minerals tablet 1 tablet  1 tablet Oral QHS Cheri Fowler, MD   1 tablet at 07/13/23 2145   mupirocin ointment (BACTROBAN) 2 % 1 Application  1 Application Nasal BID Patrici Ranks, MD   1 Application at 07/13/23 2147   Oral care mouth rinse  15 mL Mouth Rinse PRN Albustami, Flonnie Hailstone, MD       pentafluoroprop-tetrafluoroeth (GEBAUERS) aerosol 1 Application  1 Application Topical PRN Delano Metz, MD       polyethylene glycol (MIRALAX / GLYCOLAX) packet 17 g  17 g Oral Daily PRN Albustami, Flonnie Hailstone, MD       promethazine (PHENERGAN) tablet 12.5 mg  12.5 mg Oral Q8H PRN Olalere, Adewale A, MD       sevelamer carbonate (RENVELA) tablet 2,400 mg  2,400 mg Oral TID WC Tyler Pita, MD   2,400 mg at 07/14/23 1478   vancomycin (VANCOREADY) IVPB 750 mg/150 mL  750 mg Intravenous Q M,W,F-HD Quenton Fetter, RPH         Family History  Problem Relation Age of Onset   Diabetes Mellitus II Other    Diabetes Father    Renal Disease Father        ESRD       Physical Exam: BP 105/65 (BP Location: Right Arm)   Pulse 88   Temp 98.1 F (36.7 C) (Oral)   Resp 18   Wt 75.7 kg   SpO2 100%   BMI 26.14 kg/m      Diagnostic Studies & Laboratory data: I have personally reviewed the following studies and agree with the findings     Recent Radiology Findings:   EP STUDY  Result Date: 07/14/2023 See surgical note for result.  DG  Elbow 2 Views Right  Result Date: 07/12/2023 CLINICAL DATA:  Sepsis EXAM: RIGHT ELBOW - 2 VIEW COMPARISON:  None Available. FINDINGS: Slightly limited evaluation secondary to obliquity on the lateral view limiting assessment for an underlying joint effusion. Within this limitation, there is no evidence of a large elbow joint effusion. No fracture or dislocation. Joint spaces are maintained. Extensive vascular calcifications, severely advanced for age. No focal soft tissue swelling. IMPRESSION: 1. No acute osseous abnormality of the right elbow. 2. Extensive vascular calcifications, severely advanced for age. Electronically Signed   By: Duanne Guess D.O.   On: 07/12/2023 19:27   DG Foot 2 Views Right  Result Date: 07/12/2023 CLINICAL DATA:  Sepsis EXAM: RIGHT FOOT - 2 VIEW COMPARISON:  Right foot x-ray 04/10/2023 FINDINGS: There has been prior amputation of the fifth toe and distal aspect  of the fifth metatarsal, unchanged. There is no acute fracture or dislocation. No cortical erosions are identified. There is soft tissue swelling of the midfoot. Peripheral vascular calcifications are again noted. IMPRESSION: 1. No acute fracture or dislocation. 2. 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 Electronically Signed   By: Darliss Cheney M.D.   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 Would not proceed with surgical  intervention at this time. Needs 6 weeks of antibiotics and wait to clear blood from bacteremia. Work up of cardiomyopathy as per cardiology. Would repeat TEE in a week.    I have spent 60 min in review of the records, viewing studies and in face to face with patient and in coordination of future care    Eugenio Hoes 07/14/2023 12:57 PM

## 2023-07-14 NOTE — Progress Notes (Signed)
[facility] Progress Note  Subjective: Seen in room.  Incontinent of stool and unaware.  Oriented but not very conversational this AM.  Denies new complaints. Afebrile 24h.  Vitals:   07/13/23 2003 07/14/23 0515 07/14/23 0752 07/14/23 0754  BP: 125/70 (!) 109/58 109/64   Pulse: (!) 101 98    Resp: 18 18 17    Temp: 98.2 F (36.8 C) 98 F (36.7 C)    TempSrc: Oral Oral    SpO2: 95% 99%  97%  Weight:        Exam: Gen alert, no distress, calm in bed No jvd or bruits Chest clear bilat to bases RRR no RG Abd soft ntnd no mass or ascites +bs Ext no pitting edema UE's or LE's Neuro is talking and oriented to person, 2024, [facility] but not offering many details.    [facility] TDC --> removed this admission, site no issue     Temp cath R femoral vein      Renal-related home meds: - sensipar 120 at bedtime - gabapentin 100mg  - renvavite - renvela 3 ac tid      OP HD: MWF [facility] 4h  400/1.5    77kg   2/3 bath  [facility] TDC    Heparin none - last OP HD 10/7, post wt 74.6kg - coming off 75- 76kg - misses HD once every other week approx - rocaltrol 1.50 mcg  - no esa    CXR 10/12- no edema, no active disease   Assessment/ Plan:  AMS -  Improving - related to DKA and sepsis; suspect now with some delerium it appears but improving Sepsis - hx of MRSA bacteremia in 03/2023. On vanc. BCx's + for MRSA 10/12, 10/13 NGTD at 3 days. ID is following and rec cath holiday - will c/s IV team to d/c temp HD cath after HD today.  X rays of R elbow and foot 10/14 ok.  TEE today. ESRD - on HD MWF. Tol last HD Monday fine. Plan next today. HD access [facility] was dislodged and is now out. Suspect this was a tunnel infection. Has temp cath R femoral.  Repeat cultures are neg at 3 days and he's been afebrile for 24h.  Plan d/c HD cath after HD today then replace on Friday - if afebrile and cultures neg would rec TDC.   HTN/ BP - BP's are soft in the 90s- 110 range.  Volume - no vol excess, at his dry  wt. CXR clear.  UF with HD.  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 No esa at OP unit. Follow.  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 Resumed po vdra mwf. Resumed binder.     Estill Bakes MD [facility] Pager [phone]   Recent Labs  Lab 07/09/23 2231 07/10/23 0014 07/10/23 0142 07/10/23 0551 07/11/23 0547 07/11/23 1300 07/12/23 0520 07/13/23 0507  HGB 10.2*   < > 10.0*  --  10.2*  --  10.0*  --   ALBUMIN 2.8*  --  2.6*  --   --   --   --   --   CALCIUM 9.5  --  9.0   < > 9.0   < > 8.6* 9.1  PHOS  --   --  7.4*  --   --   --   --   --   CREATININE 16.48*   < > 15.81*   < > 15.34*   < > 8.83* 10.30*  K 5.0   < >  4.8   < > 4.0   < > 3.3* 3.6   < > = values in this interval not displayed.   No results for input(s): "IRON", "TIBC", "FERRITIN" in the last 168 hours. Inpatient medications:  [MAR Hold] calcitRIOL  1.5 mcg Oral Q M,W,F-HD   [MAR Hold] Chlorhexidine Gluconate Cloth  6 each Topical Q0600   [MAR Hold] cinacalcet  120 mg Oral Q supper   [MAR Hold] famotidine  10 mg Oral Daily   [MAR Hold] feeding supplement (GLUCERNA SHAKE)  237 mL Oral TID BM   [MAR Hold] gabapentin  100 mg Oral TID   [MAR Hold] heparin  5,000 Units Subcutaneous Q8H   [MAR Hold] insulin aspart  0-6 Units Subcutaneous TID WC   [MAR Hold] insulin glargine-yfgn  5 Units Subcutaneous Daily   [MAR Hold] multivitamin with minerals  1 tablet Oral QHS   [MAR Hold] mupirocin ointment  1 Application Nasal BID   [MAR Hold] sevelamer carbonate  2,400 mg Oral TID WC    sodium chloride     [MAR Hold] anticoagulant sodium citrate     [MAR Hold] vancomycin      [MAR Hold] acetaminophen, [MAR Hold] alteplase, [MAR Hold] anticoagulant sodium citrate, [MAR Hold] dextrose, [MAR Hold] docusate sodium, [MAR Hold] feeding supplement (NEPRO CARB STEADY), [MAR Hold] heparin, [MAR Hold] heparin sodium (porcine), [MAR Hold] lidocaine (PF), [MAR Hold] lidocaine-prilocaine, [MAR Hold] mouth rinse, [MAR Hold]  pentafluoroprop-tetrafluoroeth, [MAR Hold] polyethylene glycol, [MAR Hold] promethazine

## 2023-07-14 NOTE — Progress Notes (Signed)
HD Progress Note:   07/14/23 1723  Vitals  Temp (!) 96.6 F (35.9 C)  BP (!) 112/55  MAP (mmHg) 71  BP Location Left Arm  BP Method Automatic  Patient Position (if appropriate) Lying  Pulse Rate 89  Pulse Rate Source Monitor  ECG Heart Rate 90  Resp 11  Oxygen Therapy  SpO2 99 %  O2 Device Room Air  During Treatment Monitoring  Blood Flow Rate (mL/min) 0 mL/min  Arterial Pressure (mmHg) 7.68 mmHg  Venous Pressure (mmHg) 50.7 mmHg  TMP (mmHg) 23.84 mmHg  Ultrafiltration Rate (mL/min) 602 mL/min  Dialysate Flow Rate (mL/min) 299 ml/min  Dialysate Potassium Concentration 3  Dialysate Calcium Concentration 2.5  Duration of HD Treatment -hour(s) 3.25 hour(s)  Cumulative Fluid Removed (mL) per Treatment  1200.12  HD Safety Checks Performed Yes  Intra-Hemodialysis Comments Tx completed;See progress note  Dialysis Fluid Bolus Normal Saline  Bolus Amount (mL) 300 mL  Post Treatment  Dialyzer Clearance Heavily streaked  Hemodialysis Intake (mL) 0 mL  Liters Processed 55  Fluid Removed (mL) 1200 mL  Tolerated HD Treatment No (Comment)  Post-Hemodialysis Comments On and off with labile BP, unable to pull 2L as the initial UF goal. Also clotted his extracorporeal circuit 1hour left on the machine (no loss of blood).  AVG/AVF Arterial Site Held (minutes) 0 minutes  AVG/AVF Venous Site Held (minutes) 0 minutes  Hemodialysis Catheter Right Femoral vein Triple lumen Temporary (Non-Tunneled)  Placement Date/Time: 07/10/23 1600   Placed prior to admission: No  Time Out: Correct patient;Correct site;Correct procedure  Maximum sterile barrier precautions: Hand hygiene;Cap;Mask;Sterile gown;Sterile gloves;Large sterile sheet;Sterile probe cove...  Site Condition No complications  Blue Lumen Status Flushed;Heparin locked;Dead end cap in place  Red Lumen Status Flushed;Heparin locked;Dead end cap in place  Purple Lumen Status N/A  Catheter fill solution Heparin 1000 units/ml  Catheter fill  volume (Arterial) 1.4 cc  Catheter fill volume (Venous) 1.4  Dressing Type Transparent  Dressing Status Antimicrobial disc in place  Interventions New dressing  Drainage Description None  Dressing Change Due 07/21/23  Post treatment catheter status Capped and Clamped

## 2023-07-14 NOTE — Anesthesia Postprocedure Evaluation (Signed)
Anesthesia Post Note  Patient: [name]  Procedure(s) Performed: TRANSESOPHAGEAL ECHOCARDIOGRAM     Patient location during evaluation: PACU Anesthesia Type: MAC Level of consciousness: awake and alert Pain management: pain level controlled Vital Signs Assessment: post-procedure vital signs reviewed and stable Respiratory status: spontaneous breathing, nonlabored ventilation, respiratory function stable and patient connected to nasal cannula oxygen Cardiovascular status: stable and blood pressure returned to baseline Postop Assessment: no apparent nausea or vomiting Anesthetic complications: no   No notable events documented.  Last Vitals:  Vitals:   07/14/23 1300 07/14/23 1330  BP: (!) 107/57 (!) 111/56  Pulse: 87 87  Resp: (!) 0 17  Temp:    SpO2: 99% 99%    Last Pain:  Vitals:   07/14/23 1257  TempSrc: Oral  PainSc:                  Nelle Don Sande Pickert

## 2023-07-14 NOTE — CV Procedure (Signed)
TRANSESOPHAGEAL ECHOCARDIOGRAM   NAME:  [name]    MRN: [id number] DOB:  Dec 20, 1980    ADMIT DATE: 07/09/2023  INDICATIONS: Bacteremia   PROCEDURE:   Informed consent was obtained prior to the procedure. The risks, benefits and alternatives for the procedure were discussed and the patient comprehended these risks.  Risks include, but are not limited to, cough, sore throat, vomiting, nausea, somnolence, esophageal and stomach trauma or perforation, bleeding, low blood pressure, aspiration, pneumonia, infection, trauma to the teeth and death.    Procedural time out performed. The oropharynx was anesthetized with viscous lidocaine.  Anesthesia was administered by the anaesthesilogy team to achieve and maintain moderate to deep conscious sedation.  The patient's heart rate, blood pressure, and oxygen saturation were monitored continuously during the procedure.  The transesophageal probe was inserted in the esophagus and stomach without difficulty and multiple views were obtained.   The patient tolerated the procedure well.  COMPLICATIONS:    There were no immediate complications.  KEY FINDINGS:  Depressed ejection fraction EF 30-35%, Mitral valve with larger filamentous vegetation ( measurement will be in full report) suspect vegetation extends in the annulars of the mitral valve - would benefit from cardiac CT morphology. Aortic is thickened with noted vegetation. Tricuspid valve also with vegetation with  Full report to follow for complete description and measurement of vegetation. Further management per primary team: Need to be evaluated by CT surgery and recommend Cardiac CT morphology.  Thomasene Ripple, DO [facility] [employer]  [facility]  10:56 AM

## 2023-07-14 NOTE — Plan of Care (Signed)

## 2023-07-14 NOTE — Progress Notes (Signed)
PROGRESS NOTE  TILL LOUPE  ZDG:[id number] DOB: [birth year] DOA: 07/09/2023 PCP: [name], NP   Brief Narrative: Patient is a 42 year old male with history of ESRD on dialysis, insulin-dependent diabetes mellitus, gastroparesis, left AKA, hypertension who was brought here on 10/11 with altered mental status.  He was recently admitted here on July for sepsis secondary to right diabetic foot ulcer, cultures grew MRSA.  Dialysis catheter was removed and replaced.  He was treated with vancomycin, refused echo and left on 7/7 against  medical advice.  On 10/11, he was found to be confused and brought to ED.  When he was brought in, dialysis catheter was partially removed.  He was also found to be in DKA.  He started on insulin drip.  Lab work showed elevated leukocytes, lactate of 2.4.  He was hypotensive.  Admitted for septic shock.  Hospital course remarkable for finding of MRSA in blood cultures.  ID following.  Nephrology following for dialysis.  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  TEE done on 10/16 showed vegetation on multiple valves, cardiothoracic surgery consulted  Assessment & Plan:  Principal Problem:   MRSA bacteremia Active Problems:   DKA (diabetic ketoacidosis) ([name])   Altered mental status   Hemodialysis catheter infection ([name])   Right elbow pain   Endocarditis of mitral valve   ESRD on hemodialysis ([name])  Septic shock due to MRSA bacteremia: Was admitted on July and was found to have MRSA bacteremia and left AMA.  Presented with septic shock with hypotension, leukocytosis, encephalopathy.  Blood cultures again showing MRSA.  Currently on vancomycin.  Suspected HD line infection.ID following. Afebrile this morning  Infective endocarditis: TEE done today showed  vegetations on multiple valves.  Cardiothoracic surgery consulted  Combined systolic/diastolic CHF: Echo showed EF of 25 to 30%, severe decrease left ventricular function, global  hypokinesis, grade 1 diastolic dysfunction.  2D echo showed filamentous structure on the ventricular side of the mitral valve, thickened aortic valve leaflets/annulus .  His last echo on 03/2019 showed normal ejection fraction. Cardiology following.  TEE showed multiple vegetations. He remains euvolemic today  DKA: A1c of 8.9.  Initially started on insulin drip, now transition to long-acting and sliding scale.  Monitor blood sugars.  Diabetic coordinator following.  Sugars low, only on 5 units of long-acting insulin.  Acute encephalopathy: Likely secondary to sepsis, DKA and hospital-acquired delirium..  Mental status improving.  Avoid sedation.  Today he is alert, awake, obeys commands, and mostly oriented but easily distractible and forgetful  ESRD on dialysis: Dialysis catheter were partially out when he presented.  Suspected as line infection.  Dialysis catheter removed.  Now has temporary femoral dialysis catheter .nephrology following.   Receiving dialysis.  Diarrhea: C. difficile ruled out  Electrolyte imbalances: Continue to monitor and supplement as needed.  Anemia/thrombocytopenia: Likely associated  with critical illness, ESRD.  Monitor.  Abnormal CT abdomen/pelvis: Imaging showed heterogenous somewhat geographic area of low density within the anterior liver near falciform ligament, measures about 7 x 4 cm. Possible geographic steatosis versus liver mass. 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  Will do PT/OT assessment when possible.  Patient should he works at [employer]      DVT prophylaxis:heparin injection 5,000 Units Start: 07/10/23 0600     Code Status: Full Code  Family Communication: None at the bedside  Patient status:Inpatient  Patient is from :Home  Anticipated discharge to:not sure  Estimated DC date:not  sure   Consultants: ID,PCCM,nephrology  Procedures:Dialysis catheter placement  Antimicrobials:  Anti-infectives (From admission, onward)    Start     Dose/Rate Route Frequency Ordered Stop   07/14/23 1200  [MAR Hold]  vancomycin (VANCOREADY) IVPB 750 mg/150 mL        (MAR Hold since Wed 07/14/2023 at 1012.Hold Reason: Transfer to a Procedural area)   750 mg 150 mL/hr over 60 Minutes Intravenous Every M-W-F (Hemodialysis) 07/12/23 1203     07/11/23 2330  vancomycin (VANCOREADY) IVPB 750 mg/150 mL        750 mg 150 mL/hr over 60 Minutes Intravenous  Once 07/11/23 2030 07/12/23 0110   07/11/23 1800  vancomycin (VANCOREADY) IVPB 750 mg/150 mL  Status:  Discontinued        750 mg 150 mL/hr over 60 Minutes Intravenous  Once 07/11/23 0856 07/11/23 1810   07/10/23 2318  vancomycin variable dose per unstable renal function (pharmacist dosing)  Status:  Discontinued         Does not apply See admin instructions 07/10/23 2319 07/12/23 1203   07/10/23 1429  vancomycin (VANCOREADY) IVPB 750 mg/150 mL  Status:  Discontinued        750 mg 150 mL/hr over 60 Minutes Intravenous Every Dialysis 07/10/23 1429 07/11/23 2030   07/10/23 1230  fidaxomicin (DIFICID) tablet 200 mg  Status:  Discontinued        200 mg Oral 2 times daily 07/10/23 1139 07/10/23 1207   07/10/23 1000  piperacillin-tazobactam (ZOSYN) IVPB 2.25 g  Status:  Discontinued        2.25 g 100 mL/hr over 30 Minutes Intravenous Every 8 hours 07/10/23 0908 07/10/23 1350   07/09/23 2330  vancomycin (VANCOREADY) IVPB 1750 mg/350 mL        1,750 mg 175 mL/hr over 120 Minutes Intravenous  Once 07/09/23 2329 07/10/23 0734   07/09/23 2330  piperacillin-tazobactam (ZOSYN) IVPB 3.375 g        3.375 g 100 mL/hr over 30 Minutes Intravenous  Once 07/09/23 2329 07/10/23 0240       Subjective: Patient seen and examined at bedside today.  Hemodynamically stable lying in bed.  Appears overall comfortable.  Denies any complaints.  Alert, awake,  obeys commands but intermittently confused and dischargeable.  Objective: Vitals:   07/14/23 1056 07/14/23 1105 07/14/23 1117 07/14/23 1120  BP: (!) 101/49 (!) 107/55 (!) 110/54 (!) 110/57  Pulse: 90 86 90 90  Resp: 18 18 18 18   Temp: 98 F (36.7 C)     TempSrc:      SpO2: 97% 95%    Weight:        Intake/Output Summary (Last 24 hours) at 07/14/2023 1132 Last data filed at 07/14/2023 1052 Gross per 24 hour  Intake 100 ml  Output 0 ml  Net 100 ml   Filed Weights   07/10/23 0312 07/11/23 0500 07/12/23 0352  Weight: 77 kg 77 kg 75.7 kg    Examination:  General exam: Overall comfortable, not in distress, very deconditioned HEENT: PERRL Respiratory system:  no wheezes or crackles  Cardiovascular system: S1 & S2 heard, RRR.  Gastrointestinal system: Abdomen is nondistended, soft and nontender. Central nervous system: Alert and oriented to place, told correct day, obeys commands Extremities: No edema, no clubbing ,no cyanosis, left AKA, dialysis catheter on the right groin Skin: No rashes, no ulcers,no icterus, tattoos, scattered dry rash   Data Reviewed:  I have personally reviewed following labs and imaging studies  CBC: Recent Labs  Lab 07/09/23 2231 07/10/23 0014 07/10/23 0142 07/11/23 0547 07/12/23 0520 07/14/23 0910  WBC 20.7*  --  20.5* 16.0* 12.1* 15.3*  NEUTROABS 18.6*  --   --   --   --   --   HGB 10.2* 12.9*  12.6* 10.0* 10.2* 10.0* 9.2*  HCT 33.3* 38.0*  37.0* 33.7* 31.4* 29.9* 28.4*  MCV 92.0  --  95.7 84.9 82.4 87.1  PLT 155  --  153 106* 93* 108*   Basic Metabolic Panel: Recent Labs  Lab 07/09/23 2231 07/10/23 0014 07/10/23 0142 07/10/23 0551 07/11/23 0547 07/11/23 1300 07/12/23 0520 07/13/23 0507 07/14/23 0910  NA 125*   < > 126*   < > 128* 132* 131* 132* 127*  K 5.0   < > 4.8   < > 4.0 4.2 3.3* 3.6 4.3  CL 85*   < > 85*   < > 97* 98 96* 95* 92*  CO2 <7*  --  <7*   < > 15* 16* 22 21* 16*  GLUCOSE 497*   < > 542*   < > 105* 163* 100*  89 191*  BUN 64*   < > 65*   < > 73* 77* 39* 51* 62*  CREATININE 16.48*   < > 15.81*   < > 15.34* 15.58* 8.83* 10.30* 11.70*  CALCIUM 9.5  --  9.0   < > 9.0 9.2 8.6* 9.1 8.1*  MG 2.4  --  2.5*  --   --   --   --   --   --   PHOS  --   --  7.4*  --   --   --   --   --   --    < > = values in this interval not displayed.     Recent Results (from the past 240 hour(s))  Culture, blood (routine x 2)     Status: Abnormal   Collection Time: 07/10/23 12:15 AM   Specimen: BLOOD  Result Value Ref Range Status   Specimen Description BLOOD BLOOD RIGHT ARM  Final   Special Requests   Final    BOTTLES DRAWN AEROBIC AND ANAEROBIC Blood Culture results may not be optimal due to an excessive volume of blood received in culture bottles   Culture  Setup Time   Final    GRAM POSITIVE COCCI IN CLUSTERS IN BOTH AEROBIC AND ANAEROBIC BOTTLES CRITICAL RESULT CALLED TO, READ BACK BY AND VERIFIED WITH: Ebony Cargo [id number] AT 1234 BY EC Performed at [facility], [address] [place], [name] [address]    Culture METHICILLIN RESISTANT STAPHYLOCOCCUS AUREUS (A)  Final   Report Status 07/12/2023 FINAL  Final   Organism ID, Bacteria METHICILLIN RESISTANT STAPHYLOCOCCUS AUREUS  Final      Susceptibility   Methicillin resistant staphylococcus aureus - MIC*    CIPROFLOXACIN >=8 RESISTANT Resistant     ERYTHROMYCIN >=8 RESISTANT Resistant     GENTAMICIN <=0.5 SENSITIVE Sensitive     OXACILLIN >=4 RESISTANT Resistant     TETRACYCLINE <=1 SENSITIVE Sensitive     VANCOMYCIN 1 SENSITIVE Sensitive     TRIMETH/SULFA <=10 SENSITIVE Sensitive     CLINDAMYCIN <=0.25 SENSITIVE Sensitive     RIFAMPIN <=0.5 SENSITIVE Sensitive     Inducible Clindamycin NEGATIVE Sensitive     LINEZOLID 2 SENSITIVE Sensitive     * METHICILLIN RESISTANT STAPHYLOCOCCUS AUREUS  Blood Culture  ID Panel (Reflexed)     Status: Abnormal   Collection Time: 07/10/23 12:15 AM  Result Value Ref Range Status   Enterococcus faecalis  NOT DETECTED NOT DETECTED Final   Enterococcus Faecium NOT DETECTED NOT DETECTED Final   Listeria monocytogenes NOT DETECTED NOT DETECTED Final   Staphylococcus species DETECTED (A) NOT DETECTED Final    Comment: CRITICAL RESULT CALLED TO, READ BACK BY AND VERIFIED WITH: PHARMD JENNY ZHOU [id number] AT 1234 BY EC    Staphylococcus aureus (BCID) DETECTED (A) NOT DETECTED Final    Comment: Methicillin (oxacillin)-resistant Staphylococcus aureus (MRSA). MRSA is predictably resistant to beta-lactam antibiotics (except ceftaroline). Preferred therapy is vancomycin unless clinically contraindicated. Patient requires contact precautions if  hospitalized. CRITICAL RESULT CALLED TO, READ BACK BY AND VERIFIED WITH: PHARMD JENNY ZHOU [id number] AT 1234 BY EC    Staphylococcus epidermidis NOT DETECTED NOT DETECTED Final   Staphylococcus lugdunensis NOT DETECTED NOT DETECTED Final   Streptococcus species NOT DETECTED NOT DETECTED Final   Streptococcus agalactiae NOT DETECTED NOT DETECTED Final   Streptococcus pneumoniae NOT DETECTED NOT DETECTED Final   Streptococcus pyogenes NOT DETECTED NOT DETECTED Final   A.calcoaceticus-baumannii NOT DETECTED NOT DETECTED Final   Bacteroides fragilis NOT DETECTED NOT DETECTED Final   Enterobacterales NOT DETECTED NOT DETECTED Final   Enterobacter cloacae complex NOT DETECTED NOT DETECTED Final   Escherichia coli NOT DETECTED NOT DETECTED Final   Klebsiella aerogenes NOT DETECTED NOT DETECTED Final   Klebsiella oxytoca NOT DETECTED NOT DETECTED Final   Klebsiella pneumoniae NOT DETECTED NOT DETECTED Final   Proteus species NOT DETECTED NOT DETECTED Final   Salmonella species NOT DETECTED NOT DETECTED Final   Serratia marcescens NOT DETECTED NOT DETECTED Final   Haemophilus influenzae NOT DETECTED NOT DETECTED Final   Neisseria meningitidis NOT DETECTED NOT DETECTED Final   Pseudomonas aeruginosa NOT DETECTED NOT DETECTED Final   Stenotrophomonas maltophilia NOT  DETECTED NOT DETECTED Final   Candida albicans NOT DETECTED NOT DETECTED Final   Candida auris NOT DETECTED NOT DETECTED Final   Candida glabrata NOT DETECTED NOT DETECTED Final   Candida krusei NOT DETECTED NOT DETECTED Final   Candida parapsilosis NOT DETECTED NOT DETECTED Final   Candida tropicalis NOT DETECTED NOT DETECTED Final   Cryptococcus neoformans/gattii NOT DETECTED NOT DETECTED Final   Meth resistant mecA/C and MREJ DETECTED (A) NOT DETECTED Final    Comment: CRITICAL RESULT CALLED TO, READ BACK BY AND VERIFIED WITH: Valarie Merino ZHOU [id number] AT 1234 BY EC Performed at [facility], [address] [place], [name] [address]   Culture, blood (routine x 2)     Status: Abnormal   Collection Time: 07/10/23 12:16 AM   Specimen: BLOOD  Result Value Ref Range Status   Specimen Description BLOOD BLOOD RIGHT HAND  Final   Special Requests   Final    BOTTLES DRAWN AEROBIC AND ANAEROBIC Blood Culture results may not be optimal due to an excessive volume of blood received in culture bottles   Culture  Setup Time   Final    GRAM POSITIVE COCCI IN CLUSTERS IN BOTH AEROBIC AND ANAEROBIC BOTTLES CRITICAL VALUE NOTED.  VALUE IS CONSISTENT WITH PREVIOUSLY REPORTED AND CALLED VALUE.    Culture (A)  Final    STAPHYLOCOCCUS AUREUS SUSCEPTIBILITIES PERFORMED ON PREVIOUS CULTURE WITHIN THE LAST 5 DAYS. Performed at [facility], [address], [place], [name] [address]    Report Status 07/12/2023 FINAL  Final  SARS Coronavirus 2 by  RT PCR (hospital order, performed in [facility] hospital lab) *cepheid single result test*     Status: None   Collection Time: 07/10/23 12:17 AM   Specimen: Nasal Swab  Result Value Ref Range Status   SARS Coronavirus 2 by RT PCR NEGATIVE NEGATIVE Final    Comment: Performed at [facility], [address] [place], [name] [address]  MRSA Next Gen by PCR, Nasal     Status: Abnormal   Collection Time: 07/10/23  3:26 AM   Specimen: Nasal  Mucosa; Nasal Swab  Result Value Ref Range Status   MRSA by PCR Next Gen DETECTED (A) NOT DETECTED Final    Comment: RESULT CALLED TO, READ BACK BY AND VERIFIED WITH: B CUMMINGS,RN@0646  07/10/23 MK (NOTE) The GeneXpert MRSA Assay (FDA approved for NASAL specimens only), is one component of a comprehensive MRSA colonization surveillance program. It is not intended to diagnose MRSA infection nor to guide or monitor treatment for MRSA infections. Test performance is not FDA approved in patients less than 74 years old. Performed at [facility], [address], [place], [name] [address]   C Difficile Quick Screen w PCR reflex     Status: Abnormal   Collection Time: 07/10/23  8:32 AM   Specimen: STOOL  Result Value Ref Range Status   C Diff antigen POSITIVE (A) NEGATIVE Final   C Diff toxin NEGATIVE NEGATIVE Final   C Diff interpretation Results are indeterminate. See PCR results.  Final    Comment: Performed at [facility], [address], [place], [name] [address]  C. Diff by PCR, Reflexed     Status: None   Collection Time: 07/10/23  8:32 AM  Result Value Ref Range Status   Toxigenic C. Difficile by PCR NEGATIVE NEGATIVE Final    Comment: Patient is colonized with non toxigenic C. difficile. May not need treatment unless significant symptoms are present. Performed at [facility], [address], [place], [name] [address]   Culture, blood (Routine X 2) w Reflex to ID Panel     Status: None (Preliminary result)   Collection Time: 07/11/23  8:21 AM   Specimen: BLOOD LEFT ARM  Result Value Ref Range Status   Specimen Description BLOOD LEFT ARM  Final   Special Requests   Final    BOTTLES DRAWN AEROBIC AND ANAEROBIC Blood Culture adequate volume   Culture   Final    NO GROWTH 3 DAYS Performed at [facility], [address], [place], [name] [address]    Report Status PENDING  Incomplete  Culture, blood (Routine X 2) w Reflex to ID Panel     Status: None  (Preliminary result)   Collection Time: 07/11/23  8:44 AM   Specimen: BLOOD RIGHT ARM  Result Value Ref Range Status   Specimen Description BLOOD RIGHT ARM  Final   Special Requests   Final    BOTTLES DRAWN AEROBIC ONLY Blood Culture adequate volume   Culture   Final    NO GROWTH 3 DAYS Performed at [facility], [address], [place], [name] [address]    Report Status PENDING  Incomplete     Radiology Studies: EP STUDY  Result Date: 07/14/2023 See surgical note for result.  DG Elbow 2 Views Right  Result Date: 07/12/2023 CLINICAL DATA:  Sepsis EXAM: RIGHT ELBOW - 2 VIEW COMPARISON:  None Available. FINDINGS: Slightly limited evaluation secondary to obliquity on the lateral view limiting  assessment for an underlying joint effusion. Within this limitation, there is no evidence of a large elbow joint effusion. No fracture or dislocation. Joint spaces are maintained. Extensive vascular calcifications, severely advanced for age. No focal soft tissue swelling. IMPRESSION: 1. No acute osseous abnormality of the right elbow. 2. Extensive vascular calcifications, severely advanced for age. Electronically Signed   By: Duanne Guess D.O.   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 There is no acute fracture or dislocation. No cortical erosions are identified. There is soft tissue swelling of the midfoot. Peripheral vascular calcifications are again noted. IMPRESSION: 1. No acute fracture or dislocation. 2. 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 Electronically Signed   By: Darliss Cheney M.D.   On: 07/12/2023 19:25    Scheduled Meds:  [MAR Hold] calcitRIOL  1.5 mcg Oral Q M,W,F-HD   [MAR Hold] Chlorhexidine Gluconate Cloth   6 each Topical Q0600   [MAR Hold] cinacalcet  120 mg Oral Q supper   [MAR Hold] famotidine  10 mg Oral Daily   [MAR Hold] feeding supplement (GLUCERNA SHAKE)  237 mL Oral TID BM   [MAR Hold] gabapentin  100 mg Oral TID   [MAR Hold] heparin  5,000 Units Subcutaneous Q8H   [MAR Hold] insulin aspart  0-6 Units Subcutaneous TID WC   [MAR Hold] insulin glargine-yfgn  5 Units Subcutaneous Daily   [MAR Hold] multivitamin with minerals  1 tablet Oral QHS   [MAR Hold] mupirocin ointment  1 Application Nasal BID   [MAR Hold] sevelamer carbonate  2,400 mg Oral TID WC   Continuous Infusions:  sodium chloride     [MAR Hold] anticoagulant sodium citrate     [MAR Hold] vancomycin       LOS: 4 days   Burnadette Pop, MD [facility] P10/16/2024, 11:32 AM

## 2023-07-14 NOTE — Interval H&P Note (Signed)
History and Physical Interval Note:  07/14/2023 10:06 AM  [name]  has presented today for surgery, with the diagnosis of bacteremia.  The various methods of treatment have been discussed with the patient and family. After consideration of risks, benefits and other options for treatment, the patient has consented to  Procedure(s): TRANSESOPHAGEAL ECHOCARDIOGRAM (N/A) as a surgical intervention.  The patient's history has been reviewed, patient examined, no change in status, stable for surgery.  I have reviewed the patient's chart and labs.  Questions were answered to the patient's satisfaction.     [name]

## 2023-07-14 NOTE — Anesthesia Preprocedure Evaluation (Signed)
Anesthesia Evaluation  Patient identified by MRN, date of birth, ID band Patient awake    Reviewed: Allergy & Precautions, NPO status , Patient's Chart, lab work & pertinent test results  Airway Mallampati: II  TM Distance: >3 FB Neck ROM: Full    Dental  (+) Poor Dentition   Pulmonary neg pulmonary ROS   Pulmonary exam normal        Cardiovascular hypertension,  Rhythm:Regular Rate:Normal     Neuro/Psych  Headaches  negative psych ROS   GI/Hepatic negative GI ROS, Neg liver ROS,,,  Endo/Other  diabetes    Renal/GU ESRF and DialysisRenal disease  negative genitourinary   Musculoskeletal  (+) Arthritis ,    Abdominal Normal abdominal exam  (+)   Peds  Hematology  (+) Blood dyscrasia, anemia Lab Results      Component                Value               Date                      WBC                      12.1 (H)            07/12/2023                HGB                      10.0 (L)            07/12/2023                HCT                      29.9 (L)            07/12/2023                MCV                      82.4                07/12/2023                PLT                      93 (L)              07/12/2023             Lab Results      Component                Value               Date                      NA                       132 (L)             07/13/2023                K                        3.6  07/13/2023                CO2                      21 (L)              07/13/2023                GLUCOSE                  89                  07/13/2023                BUN                      51 (H)              07/13/2023                CREATININE               10.30 (H)           07/13/2023                CALCIUM                  9.1                 07/13/2023                GFRNONAA                 6 (L)               07/13/2023              Anesthesia Other Findings    Reproductive/Obstetrics                             Anesthesia Physical Anesthesia Plan  ASA: 3  Anesthesia Plan: MAC   Post-op Pain Management:    Induction: Intravenous  PONV Risk Score and Plan: 1 and Propofol infusion and Treatment may vary due to age or medical condition  Airway Management Planned: Simple Face Mask and Nasal Cannula  Additional Equipment: None  Intra-op Plan:   Post-operative Plan:   Informed Consent: I have reviewed the patients History and Physical, chart, labs and discussed the procedure including the risks, benefits and alternatives for the proposed anesthesia with the patient or authorized representative who has indicated his/her understanding and acceptance.     Dental advisory given  Plan Discussed with: CRNA  Anesthesia Plan Comments:        Anesthesia Quick Evaluation

## 2023-07-14 NOTE — Plan of Care (Signed)

## 2023-07-14 NOTE — Transfer of Care (Signed)
Immediate Anesthesia Transfer of Care Note  Patient: [name]  Procedure(s) Performed: TRANSESOPHAGEAL ECHOCARDIOGRAM  Patient Location: PACU and Cath Lab  Anesthesia Type:MAC  Level of Consciousness: drowsy  Airway & Oxygen Therapy: Patient Spontanous Breathing and Patient connected to face mask oxygen  Post-op Assessment: Report given to RN and Post -op Vital signs reviewed and stable  Post vital signs: Reviewed and stable  Last Vitals:  Vitals Value Taken Time  BP    Temp    Pulse    Resp    SpO2      Last Pain:  Vitals:   07/14/23 0951  TempSrc:   PainSc: 0-No pain      Patients Stated Pain Goal: 0 (07/11/23 1909)  Complications: No notable events documented.

## 2023-07-14 NOTE — Progress Notes (Signed)
Progress Note  Patient Name: [name] Date of Encounter: 07/14/2023  [facility] Cardiologist: None   Patient Profile     Subjective   Currently in HD getting dialyzed.  Still somewhat groggy from TEE  Inpatient Medications    Scheduled Meds:  calcitRIOL  1.5 mcg Oral Q M,W,F-HD   Chlorhexidine Gluconate Cloth  6 each Topical Q0600   cinacalcet  120 mg Oral Q supper   famotidine  10 mg Oral Daily   feeding supplement (GLUCERNA SHAKE)  237 mL Oral TID BM   gabapentin  100 mg Oral TID   heparin  5,000 Units Subcutaneous Q8H   insulin aspart  0-6 Units Subcutaneous TID WC   insulin glargine-yfgn  5 Units Subcutaneous Daily   multivitamin with minerals  1 tablet Oral QHS   mupirocin ointment  1 Application Nasal BID   sevelamer carbonate  2,400 mg Oral TID WC   Continuous Infusions:  anticoagulant sodium citrate     vancomycin 750 mg (07/14/23 1500)   PRN Meds: acetaminophen, alteplase, anticoagulant sodium citrate, dextrose, docusate sodium, feeding supplement (NEPRO CARB STEADY), heparin, heparin sodium (porcine), lidocaine (PF), lidocaine-prilocaine, mouth rinse, pentafluoroprop-tetrafluoroeth, polyethylene glycol, promethazine   Vital Signs    Vitals:   07/14/23 1630 07/14/23 1700 07/14/23 1715 07/14/23 1723  BP: (!) 109/50  (!) 117/55 (!) 112/55  Pulse: 89 89 89 89  Resp:   15 11  Temp:    (!) 96.6 F (35.9 C)  TempSrc:      SpO2: 100% 100% 100% 99%  Weight:        Intake/Output Summary (Last 24 hours) at 07/14/2023 1730 Last data filed at 07/14/2023 1723 Gross per 24 hour  Intake 100 ml  Output 1200 ml  Net -1100 ml      07/14/2023   12:55 PM 07/12/2023    3:52 AM 07/11/2023    5:00 AM  Last 3 Weights  Weight (lbs) 168 lb 14 oz 166 lb 14.2 oz 169 lb 12.1 oz  Weight (kg) 76.6 kg 75.7 kg 77 kg      Telemetry     NSR- Personally Reviewed  ECG    No new EKG to review - Personally Reviewed  Physical Exam   GEN: ill appearing in  NAD Neck: No JVD Cardiac: RRR, no murmurs, rubs, or gallops.  Respiratory: Clear to auscultation bilaterally. GI: Soft, nontender, non-distended  MS: No edema;Left AKA Neuro:  Nonfocal  Psych: Normal affect   Labs    High Sensitivity Troponin:  No results for input(s): "TROPONINIHS" in the last 720 hours.    Chemistry Recent Labs  Lab 07/09/23 2231 07/10/23 0014 07/10/23 0142 07/10/23 0551 07/12/23 0520 07/13/23 0507 07/14/23 0910  NA 125*   < > 126*   < > 131* 132* 127*  K 5.0   < > 4.8   < > 3.3* 3.6 4.3  CL 85*   < > 85*   < > 96* 95* 92*  CO2 <7*  --  <7*   < > 22 21* 16*  GLUCOSE 497*   < > 542*   < > 100* 89 191*  BUN 64*   < > 65*   < > 39* 51* 62*  CREATININE 16.48*   < > 15.81*   < > 8.83* 10.30* 11.70*  CALCIUM 9.5  --  9.0   < > 8.6* 9.1 8.1*  PROT 7.0  --  6.9  --   --   --   --  ALBUMIN 2.8*  --  2.6*  --   --   --   --   AST 33  --  29  --   --   --   --   ALT 13  --  16  --   --   --   --   ALKPHOS 178*  --  176*  --   --   --   --   BILITOT 1.9*  --  2.2*  --   --   --   --   GFRNONAA 3*  --  3*   < > 7* 6* 5*  ANIONGAP NOT CALCULATED  --  NOT CALCULATED   < > 13 16* 19*   < > = values in this interval not displayed.     Hematology Recent Labs  Lab 07/11/23 0547 07/12/23 0520 07/14/23 0910  WBC 16.0* 12.1* 15.3*  RBC 3.70* 3.63* 3.26*  HGB 10.2* 10.0* 9.2*  HCT 31.4* 29.9* 28.4*  MCV 84.9 82.4 87.1  MCH 27.6 27.5 28.2  MCHC 32.5 33.4 32.4  RDW 13.5 13.5 14.3  PLT 106* 93* 108*    BNPNo results for input(s): "BNP", "PROBNP" in the last 168 hours.   DDimer No results for input(s): "DDIMER" in the last 168 hours.   CHA2DS2-VASc Score =     This indicates a  % annual risk of stroke. The patient's score is based upon:        Radiology    ECHO TEE  Result Date: 07/14/2023    TRANSESOPHOGEAL ECHO REPORT   Patient Name:   [name] Date of Exam: 07/14/2023 Medical Rec #:  [id number]     Height:       67.0 in Accession #:     [id number]    Weight:       166.9 lb Date of Birth:  11/18/1980    BSA:          1.873 m Patient Age:    41 years      BP:           127/60 mmHg Patient Gender: M             HR:           92 bpm. Exam Location:  Inpatient Procedure: Transesophageal Echo, 3D Echo, Cardiac Doppler and Color Doppler Indications:    Bacteremia  History:        Patient has no prior history of Echocardiogram examinations.                 Aortic Valve Disease, Mitral Valve Disease, Pulmonic Valve                 Disease and Endocarditis; Signs/Symptoms:Bacteremia.  Sonographer:    Lucendia Herrlich RCS Referring Phys: [id number] Jeanella Craze PROCEDURE: After discussion of the risks and benefits of a TEE, an informed consent was obtained from the patient. The transesophogeal probe was passed without difficulty through the esophogus of the patient. Sedation performed by different physician. The patient's vital signs; including heart rate, blood pressure, and oxygen saturation; remained stable throughout the procedure. The patient developed no complications during the procedure.  IMPRESSIONS  1. Left ventricular ejection fraction, by estimation, is 30 to 35%. The left ventricle has moderately decreased function. The left ventricle has no regional wall motion abnormalities.  2. Right ventricular systolic function is normal. The right ventricular size is normal.  3. No left atrial/left atrial appendage thrombus  was detected.  4. Large mass (2.00 cm x 0.88 cm ) on the posterior mitral leaflet which appears to be extending in the mitral annulus . Will benefit from cardiac CT morphology. The mitral valve is abnormal. Mild to moderate mitral valve regurgitation. No evidence of mitral stenosis.  5. There are two mobile masses visible on the anterior leaflet of the tricupid valve which also appears to have a perforation. Another mobile mass ( 0.68 cm x 0.67 cm) on the posterior tricupid leaflet. Moderate Tricuspid regurgitation which appears to be  through the leaflet perforation . The tricuspid valve is abnormal.  6. The aortic valve is abnormal. Aortic valve regurgitation is not visualized. No aortic stenosis is present.  7. The inferior vena cava is normal in size with greater than 50% respiratory variability, suggesting right atrial pressure of 3 mmHg. 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 Findings concerning for tricuspid valve vegetation. Cardiac CT morphology will be beneficial. Consult for CT surgery. FINDINGS  Left Ventricle: Left ventricular ejection fraction, by estimation, is 30 to 35%. The left ventricle has moderately decreased function. The left ventricle has no regional wall motion abnormalities. The left ventricular internal cavity size was normal in size. There is no left ventricular hypertrophy. Right Ventricle: The right ventricular size is normal. No increase in right ventricular wall thickness. Right ventricular systolic function is normal. Left Atrium: Left atrial size was normal in size. No left atrial/left atrial appendage thrombus was detected. Right Atrium: Right atrial size was normal in size. Pericardium: There is no evidence of pericardial effusion. Mitral Valve: Large mass (2.00 cm x 0.88 cm ) on the posterior mitral leaflet which appears to be extending in the mitral annulus . Will benefit from cardiac CT morphology. The mitral valve is abnormal. Mild to moderate mitral valve regurgitation. No evidence of mitral valve stenosis. Tricuspid Valve: There are two mobile masses visible on the anterior leaflet of the tricupid valve which also appears to have a perforation. Another mobile mass ( 0.68 cm x 0.67 cm) on the posterior tricupid leaflet. Moderate Tricuspid regurgitation which appears to be through the leaflet perforation. The tricuspid valve is abnormal. Aortic Valve: The aortic valve is severely thickened. There is a filamentous mass (0.56 cm) on  the. The aortic valve is abnormal. Aortic valve regurgitation is not visualized. No aortic stenosis is present. Pulmonic Valve: The pulmonic valve was normal in structure. Pulmonic valve regurgitation is not visualized. No evidence of pulmonic stenosis. Aorta: The aortic root and ascending aorta are structurally normal, with no evidence of dilitation. Venous: The right upper pulmonary vein, right lower pulmonary vein, left upper pulmonary vein and left lower pulmonary vein are normal. A normal flow pattern is recorded from the left upper pulmonary vein. The inferior vena cava is normal in size with greater than 50% respiratory variability, suggesting right atrial pressure of 3 mmHg. IAS/Shunts: No atrial level shunt detected by color flow Doppler. Additional Comments: Spectral Doppler performed.  AORTA Ao Root diam: 3.00 cm Ao Asc diam:  2.90 cm Kardie Tobb DO Electronically signed by Thomasene Ripple DO Signature Date/Time: 07/14/2023/4:50:32 PM    Final    EP STUDY  Result Date: 07/14/2023 See surgical note for result.   Patient Profile     42 y.o. male with a hx of DM with ESRD / gastroparesis, foot ulcerations, s/p L AKA, HTN, MRSA bacteremia 03/2023 & anemia who is being seen 07/13/2023 for the evaluation of acute systolic CHF at the  request of Dr. Renford Dills.   Assessment & Plan    #Sepsis secondary to MRSA Bacteremia  #Rule Out Infective Endocarditis Infection in July 2024, left AMA.  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 ECHO concerning for filamentous structure on the ventricular side of the mitral valve, the mitral valve is abnormal, trivial MVR, aortic valve leaflets and annulus appear thickened concerning for IE. Fever to 101 (with tylenol) 10/14, WBC down to 12.1  -abx per ID  -TEE today personally reviewed and demonstrated a large vegetation measuring 2 cm x 0.8 cm on the posterior mitral valve leaflet extending into the mitral valve annulus and highly mobile  associated with mild to moderate MR  -TEE report also noted 2 mobile masses on the anterior leaflet of the tricuspid valve with a possible perforation of the tricuspid valve as well as a mobile mass on the posterior tricuspid valve leaflet with moderate TR through the leaflet perforation>>I have reviewed images personally and there is TR but cannot discern any vegetations or leaflet perforations -Appreciate CVTS consult >> they do not recommend surgical intervention at this time and instead recommend IV antibiotics x 6 weeks to clear bacteremia and then repeat TEE in 1 week to make sure that vegetation remains stable or decreased   #Acute Systolic CHF  Prior LVEF 2020 19-14% -07/11/23 LVEF 20-25%. No prior ischemic evaluation. Suspect acute change in LVEF related to protracted sepsis/infection although given his history of IDDM which is poorly controlled for some time he would be at high risk for premature CAD -Not a candidate for coronary CTA given end-stage renal disease and likelihood for significant coronary artery calcifications which were noted on recent abdominal pelvic CT and would make imaging difficult -Cath contraindicated at this time in setting of bacteremia but ultimately would be best option once he has had adequate antibiotic coverage for 6 weeks with negative blood cultures to define coronary anatomy -HIV negative -Volume managed by hemodialysis -would defer GDMT for now in the setting of active endocarditis and bacteremia as well as soft BP -He is not a candidate for MRA/ARNI/SGLT2 I due to ESRD are on HD   #Insulin Dependent DM with Gastroparesis  Hgb A1c 8.9  -per primary    #ESRD  -HD / volume status per Nephrology    #R Foot Wounds  -per primary / ID    #Poorly Defined Liver Lesion  7x3 cm liver lesion, steatosis vs liver mass vs abscess? -defer work up to primary   Will follow at a distance and plan repeat TEE in 1 week.  If he is discharged in this period of time  please call cardiology so we can set up outpatient TEE.  Will set up follow-up in our advanced heart failure clinic as well.  Total time spent with patient today 40 minutes. This includes reviewing records, reviewing TEE images, evaluating the patient and coordinating care. Face-to-face time >50%.  For questions or updates, please contact [facility] Please consult [email or web address] for contact info under        Signed, Armanda Magic, MD  07/14/2023, 5:30 PM

## 2023-07-14 NOTE — Progress Notes (Addendum)
Subjective:  "I'm hungry"   Antibiotics:  Anti-infectives (From admission, onward)    Start     Dose/Rate Route Frequency Ordered Stop   07/14/23 1200  vancomycin (VANCOREADY) IVPB 750 mg/150 mL        750 mg 150 mL/hr over 60 Minutes Intravenous Every M-W-F (Hemodialysis) 07/12/23 1203     07/11/23 2330  vancomycin (VANCOREADY) IVPB 750 mg/150 mL        750 mg 150 mL/hr over 60 Minutes Intravenous  Once 07/11/23 2030 07/12/23 0110   07/11/23 1800  vancomycin (VANCOREADY) IVPB 750 mg/150 mL  Status:  Discontinued        750 mg 150 mL/hr over 60 Minutes Intravenous  Once 07/11/23 0856 07/11/23 1810   07/10/23 2318  vancomycin variable dose per unstable renal function (pharmacist dosing)  Status:  Discontinued         Does not apply See admin instructions 07/10/23 2319 07/12/23 1203   07/10/23 1429  vancomycin (VANCOREADY) IVPB 750 mg/150 mL  Status:  Discontinued        750 mg 150 mL/hr over 60 Minutes Intravenous Every Dialysis 07/10/23 1429 07/11/23 2030   07/10/23 1230  fidaxomicin (DIFICID) tablet 200 mg  Status:  Discontinued        200 mg Oral 2 times daily 07/10/23 1139 07/10/23 1207   07/10/23 1000  piperacillin-tazobactam (ZOSYN) IVPB 2.25 g  Status:  Discontinued        2.25 g 100 mL/hr over 30 Minutes Intravenous Every 8 hours 07/10/23 0908 07/10/23 1350   07/09/23 2330  vancomycin (VANCOREADY) IVPB 1750 mg/350 mL        1,750 mg 175 mL/hr over 120 Minutes Intravenous  Once 07/09/23 2329 07/10/23 0734   07/09/23 2330  piperacillin-tazobactam (ZOSYN) IVPB 3.375 g        3.375 g 100 mL/hr over 30 Minutes Intravenous  Once 07/09/23 2329 07/10/23 0240       Medications: Scheduled Meds:  calcitRIOL  1.5 mcg Oral Q M,W,F-HD   Chlorhexidine Gluconate Cloth  6 each Topical Q0600   cinacalcet  120 mg Oral Q supper   famotidine  10 mg Oral Daily   feeding supplement (GLUCERNA SHAKE)  237 mL Oral TID BM   gabapentin  100 mg Oral TID   heparin  5,000 Units  Subcutaneous Q8H   insulin aspart  0-6 Units Subcutaneous TID WC   insulin glargine-yfgn  5 Units Subcutaneous Daily   multivitamin with minerals  1 tablet Oral QHS   mupirocin ointment  1 Application Nasal BID   sevelamer carbonate  2,400 mg Oral TID WC   Continuous Infusions:  anticoagulant sodium citrate     vancomycin 750 mg (07/14/23 1500)   PRN Meds:.acetaminophen, alteplase, anticoagulant sodium citrate, dextrose, docusate sodium, feeding supplement (NEPRO CARB STEADY), heparin, heparin sodium (porcine), lidocaine (PF), lidocaine-prilocaine, mouth rinse, pentafluoroprop-tetrafluoroeth, polyethylene glycol, promethazine    Objective: Weight change:   Intake/Output Summary (Last 24 hours) at 07/14/2023 1703 Last data filed at 07/14/2023 1052 Gross per 24 hour  Intake 100 ml  Output 0 ml  Net 100 ml   Blood pressure (!) 109/50, pulse 89, temperature 98.5 F (36.9 C), temperature source Oral, resp. rate 20, weight 76.6 kg, SpO2 100%. Temp:  [98 F (36.7 C)-98.5 F (36.9 C)] 98.5 F (36.9 C) (10/16 1257) Pulse Rate:  [86-101] 89 (10/16 1700) Resp:  [0-20] 20 (10/16 1500) BP: (101-125)/(44-70) 109/50 (10/16 1630) SpO2:  [95 %-100 %]  100 % (10/16 1700) Weight:  [76.6 kg] 76.6 kg (10/16 1255)  Physical Exam: Physical Exam Constitutional:      Appearance: He is well-developed.  HENT:     Head: Normocephalic and atraumatic.  Eyes:     Conjunctiva/sclera: Conjunctivae normal.  Cardiovascular:     Rate and Rhythm: Normal rate and regular rhythm.  Pulmonary:     Effort: Pulmonary effort is normal. No respiratory distress.     Breath sounds: No wheezing.  Abdominal:     General: There is no distension.     Palpations: Abdomen is soft.  Musculoskeletal:     Cervical back: Normal range of motion and neck supple.  Skin:    General: Skin is warm and dry.     Findings: No erythema or rash.  Neurological:     General: No focal deficit present.     Mental Status: He is  alert and oriented to person, place, and time.  Psychiatric:        Mood and Affect: Mood normal.        Behavior: Behavior normal.        Thought Content: Thought content normal.        Judgment: Judgment normal.        Right elbow area    Right foot    Left amputation site    CBC:    BMET Recent Labs    07/13/23 0507 07/14/23 0910  NA 132* 127*  K 3.6 4.3  CL 95* 92*  CO2 21* 16*  GLUCOSE 89 191*  BUN 51* 62*  CREATININE 10.30* 11.70*  CALCIUM 9.1 8.1*     Liver Panel  No results for input(s): "PROT", "ALBUMIN", "AST", "ALT", "ALKPHOS", "BILITOT", "BILIDIR", "IBILI" in the last 72 hours.      Sedimentation Rate No results for input(s): "ESRSEDRATE" in the last 72 hours. C-Reactive Protein No results for input(s): "CRP" in the last 72 hours.  Micro Results: Recent Results (from the past 720 hour(s))  Culture, blood (routine x 2)     Status: Abnormal   Collection Time: 07/10/23 12:15 AM   Specimen: BLOOD  Result Value Ref Range Status   Specimen Description BLOOD BLOOD RIGHT ARM  Final   Special Requests   Final    BOTTLES DRAWN AEROBIC AND ANAEROBIC Blood Culture results may not be optimal due to an excessive volume of blood received in culture bottles   Culture  Setup Time   Final    GRAM POSITIVE COCCI IN CLUSTERS IN BOTH AEROBIC AND ANAEROBIC BOTTLES CRITICAL RESULT CALLED TO, READ BACK BY AND VERIFIED WITH: Ebony Cargo [id number] AT 1234 BY EC Performed at [facility], [name], [place], [name] [address]    Culture METHICILLIN RESISTANT STAPHYLOCOCCUS AUREUS (A)  Final   Report Status 07/12/2023 FINAL  Final   Organism ID, Bacteria METHICILLIN RESISTANT STAPHYLOCOCCUS AUREUS  Final      Susceptibility   Methicillin resistant staphylococcus aureus - MIC*    CIPROFLOXACIN >=8 RESISTANT Resistant     ERYTHROMYCIN >=8 RESISTANT Resistant     GENTAMICIN <=0.5 SENSITIVE Sensitive     OXACILLIN >=4 RESISTANT Resistant      TETRACYCLINE <=1 SENSITIVE Sensitive     VANCOMYCIN 1 SENSITIVE Sensitive     TRIMETH/SULFA <=10 SENSITIVE Sensitive     CLINDAMYCIN <=0.25 SENSITIVE Sensitive     RIFAMPIN <=0.5 SENSITIVE Sensitive     Inducible Clindamycin NEGATIVE Sensitive     LINEZOLID 2 SENSITIVE  Sensitive     * METHICILLIN RESISTANT STAPHYLOCOCCUS AUREUS  Blood Culture ID Panel (Reflexed)     Status: Abnormal   Collection Time: 07/10/23 12:15 AM  Result Value Ref Range Status   Enterococcus faecalis NOT DETECTED NOT DETECTED Final   Enterococcus Faecium NOT DETECTED NOT DETECTED Final   Listeria monocytogenes NOT DETECTED NOT DETECTED Final   Staphylococcus species DETECTED (A) NOT DETECTED Final    Comment: CRITICAL RESULT CALLED TO, READ BACK BY AND VERIFIED WITH: PHARMD JENNY ZHOU [id number] AT 1234 BY EC    Staphylococcus aureus (BCID) DETECTED (A) NOT DETECTED Final    Comment: Methicillin (oxacillin)-resistant Staphylococcus aureus (MRSA). MRSA is predictably resistant to beta-lactam antibiotics (except ceftaroline). Preferred therapy is vancomycin unless clinically contraindicated. Patient requires contact precautions if  hospitalized. CRITICAL RESULT CALLED TO, READ BACK BY AND VERIFIED WITH: PHARMD JENNY ZHOU [id number] AT 1234 BY EC    Staphylococcus epidermidis NOT DETECTED NOT DETECTED Final   Staphylococcus lugdunensis NOT DETECTED NOT DETECTED Final   Streptococcus species NOT DETECTED NOT DETECTED Final   Streptococcus agalactiae NOT DETECTED NOT DETECTED Final   Streptococcus pneumoniae NOT DETECTED NOT DETECTED Final   Streptococcus pyogenes NOT DETECTED NOT DETECTED Final   A.calcoaceticus-baumannii NOT DETECTED NOT DETECTED Final   Bacteroides fragilis NOT DETECTED NOT DETECTED Final   Enterobacterales NOT DETECTED NOT DETECTED Final   Enterobacter cloacae complex NOT DETECTED NOT DETECTED Final   Escherichia coli NOT DETECTED NOT DETECTED Final   Klebsiella aerogenes NOT DETECTED NOT DETECTED  Final   Klebsiella oxytoca NOT DETECTED NOT DETECTED Final   Klebsiella pneumoniae NOT DETECTED NOT DETECTED Final   Proteus species NOT DETECTED NOT DETECTED Final   Salmonella species NOT DETECTED NOT DETECTED Final   Serratia marcescens NOT DETECTED NOT DETECTED Final   Haemophilus influenzae NOT DETECTED NOT DETECTED Final   Neisseria meningitidis NOT DETECTED NOT DETECTED Final   Pseudomonas aeruginosa NOT DETECTED NOT DETECTED Final   Stenotrophomonas maltophilia NOT DETECTED NOT DETECTED Final   Candida albicans NOT DETECTED NOT DETECTED Final   Candida auris NOT DETECTED NOT DETECTED Final   Candida glabrata NOT DETECTED NOT DETECTED Final   Candida krusei NOT DETECTED NOT DETECTED Final   Candida parapsilosis NOT DETECTED NOT DETECTED Final   Candida tropicalis NOT DETECTED NOT DETECTED Final   Cryptococcus neoformans/gattii NOT DETECTED NOT DETECTED Final   Meth resistant mecA/C and MREJ DETECTED (A) NOT DETECTED Final    Comment: CRITICAL RESULT CALLED TO, READ BACK BY AND VERIFIED WITH: Valarie Merino ZHOU [id number] AT 1234 BY EC Performed at [facility], [address], [place], [name] [address]   Culture, blood (routine x 2)     Status: Abnormal   Collection Time: 07/10/23 12:16 AM   Specimen: BLOOD  Result Value Ref Range Status   Specimen Description BLOOD BLOOD RIGHT HAND  Final   Special Requests   Final    BOTTLES DRAWN AEROBIC AND ANAEROBIC Blood Culture results may not be optimal due to an excessive volume of blood received in culture bottles   Culture  Setup Time   Final    GRAM POSITIVE COCCI IN CLUSTERS IN BOTH AEROBIC AND ANAEROBIC BOTTLES CRITICAL VALUE NOTED.  VALUE IS CONSISTENT WITH PREVIOUSLY REPORTED AND CALLED VALUE.    Culture (A)  Final    STAPHYLOCOCCUS AUREUS SUSCEPTIBILITIES PERFORMED ON PREVIOUS CULTURE WITHIN THE LAST 5 DAYS. Performed at [facility], [address], [place], [name] [address]  Report Status 07/12/2023 FINAL   Final  SARS Coronavirus 2 by RT PCR (hospital order, performed in [facility] hospital lab) *cepheid single result test*     Status: None   Collection Time: 07/10/23 12:17 AM   Specimen: Nasal Swab  Result Value Ref Range Status   SARS Coronavirus 2 by RT PCR NEGATIVE NEGATIVE Final    Comment: Performed at [facility], [address] [place], [name] [address]  MRSA Next Gen by PCR, Nasal     Status: Abnormal   Collection Time: 07/10/23  3:26 AM   Specimen: Nasal Mucosa; Nasal Swab  Result Value Ref Range Status   MRSA by PCR Next Gen DETECTED (A) NOT DETECTED Final    Comment: RESULT CALLED TO, READ BACK BY AND VERIFIED WITH: B CUMMINGS,RN@0646  07/10/23 MK (NOTE) The GeneXpert MRSA Assay (FDA approved for NASAL specimens only), is one component of a comprehensive MRSA colonization surveillance program. It is not intended to diagnose MRSA infection nor to guide or monitor treatment for MRSA infections. Test performance is not FDA approved in patients less than 41 years old. Performed at [facility], [address] [place], [name] [address]   C Difficile Quick Screen w PCR reflex     Status: Abnormal   Collection Time: 07/10/23  8:32 AM   Specimen: STOOL  Result Value Ref Range Status   C Diff antigen POSITIVE (A) NEGATIVE Final   C Diff toxin NEGATIVE NEGATIVE Final   C Diff interpretation Results are indeterminate. See PCR results.  Final    Comment: Performed at [facility], [address] [place], [name] [address]  C. Diff by PCR, Reflexed     Status: None   Collection Time: 07/10/23  8:32 AM  Result Value Ref Range Status   Toxigenic C. Difficile by PCR NEGATIVE NEGATIVE Final    Comment: Patient is colonized with non toxigenic C. difficile. May not need treatment unless significant symptoms are present. Performed at [facility], [address] [place], [name] [address]   Culture, blood (Routine X 2) w Reflex to ID Panel     Status: None  (Preliminary result)   Collection Time: 07/11/23  8:21 AM   Specimen: BLOOD LEFT ARM  Result Value Ref Range Status   Specimen Description BLOOD LEFT ARM  Final   Special Requests   Final    BOTTLES DRAWN AEROBIC AND ANAEROBIC Blood Culture adequate volume   Culture   Final    NO GROWTH 3 DAYS Performed at [facility], [address], [place], [name] [address]    Report Status PENDING  Incomplete  Culture, blood (Routine X 2) w Reflex to ID Panel     Status: None (Preliminary result)   Collection Time: 07/11/23  8:44 AM   Specimen: BLOOD RIGHT ARM  Result Value Ref Range Status   Specimen Description BLOOD RIGHT ARM  Final   Special Requests   Final    BOTTLES DRAWN AEROBIC ONLY Blood Culture adequate volume   Culture   Final    NO GROWTH 3 DAYS Performed at [facility], [address] [place], [name] [address]    Report Status PENDING  Incomplete    Studies/Results: ECHO TEE  Result Date: 07/14/2023    TRANSESOPHOGEAL ECHO REPORT   Patient Name:   [name] Date of Exam: 07/14/2023 Medical Rec #:  [id number]     Height:  67.0 in Accession #:    [id number]    Weight:       166.9 lb Date of Birth:  [birth year]    BSA:          1.873 m Patient Age:    41 years      BP:           127/60 mmHg Patient Gender: M             HR:           92 bpm. Exam Location:  Inpatient Procedure: Transesophageal Echo, 3D Echo, Cardiac Doppler and Color Doppler Indications:    Bacteremia  History:        Patient has no prior history of Echocardiogram examinations.                 Aortic Valve Disease, Mitral Valve Disease, Pulmonic Valve                 Disease and Endocarditis; Signs/Symptoms:Bacteremia.  Sonographer:    Lucendia Herrlich RCS Referring Phys: [id number] Jeanella Craze PROCEDURE: After discussion of the risks and benefits of a TEE, an informed consent was obtained from the patient. The transesophogeal probe was passed without difficulty through the esophogus of the  patient. Sedation performed by different physician. The patient's vital signs; including heart rate, blood pressure, and oxygen saturation; remained stable throughout the procedure. The patient developed no complications during the procedure.  IMPRESSIONS  1. Left ventricular ejection fraction, by estimation, is 30 to 35%. The left ventricle has moderately decreased function. The left ventricle has no regional wall motion abnormalities.  2. Right ventricular systolic function is normal. The right ventricular size is normal.  3. No left atrial/left atrial appendage thrombus was detected.  4. Large mass (2.00 cm x 0.88 cm ) on the posterior mitral leaflet which appears to be extending in the mitral annulus . Will benefit from cardiac CT morphology. The mitral valve is abnormal. Mild to moderate mitral valve regurgitation. No evidence of mitral stenosis.  5. There are two mobile masses visible on the anterior leaflet of the tricupid valve which also appears to have a perforation. Another mobile mass ( 0.68 cm x 0.67 cm) on the posterior tricupid leaflet. Moderate Tricuspid regurgitation which appears to be through the leaflet perforation . The tricuspid valve is abnormal.  6. The aortic valve is abnormal. Aortic valve regurgitation is not visualized. No aortic stenosis is present.  7. The inferior vena cava is normal in size with greater than 50% respiratory variability, suggesting right atrial pressure of 3 mmHg. 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 Findings concerning for tricuspid valve vegetation. Cardiac CT morphology will be beneficial. Consult for CT surgery. FINDINGS  Left Ventricle: Left ventricular ejection fraction, by estimation, is 30 to 35%. The left ventricle has moderately decreased function. The left ventricle has no regional wall motion abnormalities. The left ventricular internal cavity size was normal in size. There is no  left ventricular hypertrophy. Right Ventricle: The right ventricular size is normal. No increase in right ventricular wall thickness. Right ventricular systolic function is normal. Left Atrium: Left atrial size was normal in size. No left atrial/left atrial appendage thrombus was detected. Right Atrium: Right atrial size was normal in size. Pericardium: There is no evidence of pericardial effusion. Mitral Valve: Large mass (2.00 cm x 0.88 cm ) on the posterior mitral leaflet which appears to be extending in the mitral annulus . Will  benefit from cardiac CT morphology. The mitral valve is abnormal. Mild to moderate mitral valve regurgitation. No evidence of mitral valve stenosis. Tricuspid Valve: There are two mobile masses visible on the anterior leaflet of the tricupid valve which also appears to have a perforation. Another mobile mass ( 0.68 cm x 0.67 cm) on the posterior tricupid leaflet. Moderate Tricuspid regurgitation which appears to be through the leaflet perforation. The tricuspid valve is abnormal. Aortic Valve: The aortic valve is severely thickened. There is a filamentous mass (0.56 cm) on the. The aortic valve is abnormal. Aortic valve regurgitation is not visualized. No aortic stenosis is present. Pulmonic Valve: The pulmonic valve was normal in structure. Pulmonic valve regurgitation is not visualized. No evidence of pulmonic stenosis. Aorta: The aortic root and ascending aorta are structurally normal, with no evidence of dilitation. Venous: The right upper pulmonary vein, right lower pulmonary vein, left upper pulmonary vein and left lower pulmonary vein are normal. A normal flow pattern is recorded from the left upper pulmonary vein. The inferior vena cava is normal in size with greater than 50% respiratory variability, suggesting right atrial pressure of 3 mmHg. IAS/Shunts: No atrial level shunt detected by color flow Doppler. Additional Comments: Spectral Doppler performed.  AORTA Ao Root diam:  3.00 cm Ao Asc diam:  2.90 cm Kardie Tobb DO Electronically signed by Thomasene Ripple DO Signature Date/Time: 07/14/2023/4:50:32 PM    Final    EP STUDY  Result Date: 07/14/2023 See surgical note for result.     Assessment/Plan:  INTERVAL HISTORY:  TEE with large vegetation , seen by Dr. Leafy Ro from CT surgery   Principal Problem:   MRSA bacteremia Active Problems:   DKA (diabetic ketoacidosis) ([name])   Altered mental status   Hemodialysis catheter infection ([name])   Right elbow pain   Endocarditis of mitral valve   ESRD on hemodialysis ([name])    [name] is a 42 y.o. 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 Not clear whether he did or did not receive antibiotics for 4 weeks despite leaving AMA but he is again admitted with MRSA uremia and sepsis: He has been found to have filamentous structure on the Mitral valve. TEE confirms this as a quite large vegetation  #1 MRSA  bacteremia, sepsis and MV endocarditis  He will have his HD catheter removed  Would leave it out as long as possible before placement of a new one  CT surgery would like repeat TEE in 2 weeks  We will plan on 6 weeks of IV antibiotics which can be given with HD  He will be at high risk of septic embolization given size of his vegetation  I have personally spent 54  minutes involved in face-to-face and non-face-to-face activities for this patient on the day of the visit. Professional time spent includes the following activities: Preparing to see the patient (review of tests), Obtaining and/or reviewing separately obtained history (admission/discharge record), Performing a medically appropriate examination and/or evaluation , Ordering medications/tests/procedures, referring and communicating with other health care professionals, Documenting clinical information in  the EMR, Independently interpreting results (not separately reported), Communicating results to the patient/family/caregiver, Counseling and educating the patient/family/caregiver and Care coordination (not separately reported).     LOS: 4 days   Acey Lav 07/14/2023, 5:03 PM

## 2023-07-15 ENCOUNTER — Inpatient Hospital Stay (HOSPITAL_COMMUNITY): Payer: Medicare HMO

## 2023-07-15 ENCOUNTER — Encounter (HOSPITAL_COMMUNITY): Payer: Self-pay | Admitting: Cardiology

## 2023-07-15 DIAGNOSIS — I639 Cerebral infarction, unspecified: Secondary | ICD-10-CM | POA: Diagnosis not present

## 2023-07-15 DIAGNOSIS — R7881 Bacteremia: Secondary | ICD-10-CM | POA: Diagnosis not present

## 2023-07-15 DIAGNOSIS — I330 Acute and subacute infective endocarditis: Secondary | ICD-10-CM | POA: Diagnosis present

## 2023-07-15 DIAGNOSIS — T80211A Bloodstream infection due to central venous catheter, initial encounter: Secondary | ICD-10-CM | POA: Diagnosis not present

## 2023-07-15 DIAGNOSIS — N186 End stage renal disease: Secondary | ICD-10-CM | POA: Diagnosis not present

## 2023-07-15 DIAGNOSIS — B9562 Methicillin resistant Staphylococcus aureus infection as the cause of diseases classified elsewhere: Secondary | ICD-10-CM | POA: Diagnosis not present

## 2023-07-15 LAB — BASIC METABOLIC PANEL
Anion gap: 28 — ABNORMAL HIGH (ref 5–15)
BUN: 49 mg/dL — ABNORMAL HIGH (ref 6–20)
CO2: 14 mmol/L — ABNORMAL LOW (ref 22–32)
Calcium: 8.3 mg/dL — ABNORMAL LOW (ref 8.9–10.3)
Chloride: 89 mmol/L — ABNORMAL LOW (ref 98–111)
Creatinine, Ser: 8.54 mg/dL — ABNORMAL HIGH (ref 0.61–1.24)
GFR, Estimated: 7 mL/min — ABNORMAL LOW (ref >60)
Glucose, Bld: 332 mg/dL — ABNORMAL HIGH (ref 70–99)
Potassium: 4.1 mmol/L (ref 3.5–5.1)
Sodium: 131 mmol/L — ABNORMAL LOW (ref 135–145)

## 2023-07-15 LAB — CBC
HCT: 29.3 % — ABNORMAL LOW (ref 39.0–52.0)
Hemoglobin: 9.3 g/dL — ABNORMAL LOW (ref 13.0–17.0)
MCH: 28.2 pg (ref 26.0–34.0)
MCHC: 31.7 g/dL (ref 30.0–36.0)
MCV: 88.8 fL (ref 80.0–100.0)
Platelets: 166 10*3/uL (ref 150–400)
RBC: 3.3 MIL/uL — ABNORMAL LOW (ref 4.22–5.81)
RDW: 14.6 % (ref 11.5–15.5)
WBC: 13.7 10*3/uL — ABNORMAL HIGH (ref 4.0–10.5)
nRBC: 0.1 % (ref 0.0–0.2)

## 2023-07-15 LAB — GLUCOSE, CAPILLARY
Glucose-Capillary: 345 mg/dL — ABNORMAL HIGH (ref 70–99)
Glucose-Capillary: 355 mg/dL — ABNORMAL HIGH (ref 70–99)
Glucose-Capillary: 381 mg/dL — ABNORMAL HIGH (ref 70–99)
Glucose-Capillary: 382 mg/dL — ABNORMAL HIGH (ref 70–99)
Glucose-Capillary: 389 mg/dL — ABNORMAL HIGH (ref 70–99)

## 2023-07-15 LAB — PHOSPHORUS: Phosphorus: 5.2 mg/dL — ABNORMAL HIGH (ref 2.5–4.6)

## 2023-07-15 LAB — VANCOMYCIN, RANDOM: Vancomycin Rm: 25 ug/mL

## 2023-07-15 MED ORDER — INSULIN ASPART 100 UNIT/ML IJ SOLN
6.0000 [IU] | Freq: Once | INTRAMUSCULAR | Status: AC
  Administered 2023-07-15: 6 [IU] via SUBCUTANEOUS

## 2023-07-15 MED ORDER — CEFAZOLIN SODIUM-DEXTROSE 2-4 GM/100ML-% IV SOLN
2.0000 g | INTRAVENOUS | Status: AC
  Administered 2023-07-16: 2 g via INTRAVENOUS
  Filled 2023-07-15: qty 100

## 2023-07-15 MED ORDER — STROKE: EARLY STAGES OF RECOVERY BOOK
Freq: Once | Status: AC
  Filled 2023-07-15: qty 1

## 2023-07-15 MED ORDER — OXYCODONE HCL 5 MG PO TABS
5.0000 mg | ORAL_TABLET | Freq: Once | ORAL | Status: AC
  Administered 2023-07-15: 5 mg via ORAL
  Filled 2023-07-15: qty 1

## 2023-07-15 MED ORDER — DARBEPOETIN ALFA 40 MCG/0.4ML IJ SOSY
40.0000 ug | PREFILLED_SYRINGE | INTRAMUSCULAR | Status: DC
  Administered 2023-07-16 – 2023-08-06 (×4): 40 ug via SUBCUTANEOUS
  Filled 2023-07-15 (×4): qty 0.4

## 2023-07-15 MED ORDER — INSULIN GLARGINE-YFGN 100 UNIT/ML ~~LOC~~ SOLN
10.0000 [IU] | Freq: Every day | SUBCUTANEOUS | Status: DC
  Administered 2023-07-16 – 2023-07-18 (×3): 10 [IU] via SUBCUTANEOUS
  Filled 2023-07-15 (×4): qty 0.1

## 2023-07-15 NOTE — Plan of Care (Addendum)
0900: RN was notified in shift report that pt having L sided arm weakness, and per report this symptom was not new/acute,previous MD aware.   RN performed assessment and noticed pt L side arm flaccid and pt unable to move, When Rn asked pt to smile, pt smile asymmetrical with L-side staying still. During rounding RN notified Dr.Adhikari of findings, MD assessed pt then stated he would look into it. Per pt, this is not new but pt unsure when weakness started. Per pt he has been like this for days but was not like this before he came to hospital this admission. MD ordered MRIs.   1330: Radiology called RN to notify RN that results from MRI had been read, and mentioned mentioned it did say sizeable right MCA branch infarct, RN notified Dr.Adhikari of findings, MD already aware.

## 2023-07-15 NOTE — Progress Notes (Signed)
[facility] Progress Note  Subjective: Seen in room.  Oriented but not very conversational this AM.  Denies new complaints.  No issues with HD yesterday, fem temp HD line out now.  Afebrile 48h.  TEE yesterday with findings c/f endocarditis.  Vitals:   07/14/23 2358 07/15/23 0437 07/15/23 0500 07/15/23 0800  BP: (!) 89/48 110/72  (!) 97/54  Pulse: 95 96  91  Resp: 18 18  18   Temp: 98.3 F (36.8 C) 98.1 F (36.7 C)  97.7 F (36.5 C)  TempSrc:      SpO2: 100% 99%  96%  Weight:   74.5 kg     Exam: Gen alert, no distress, calm in bed No jvd or bruits Chest clear bilat to bases RRR no RG Abd soft ntnd no mass or ascites +bs Ext no pitting edema UE's or LE's Neuro is talking and oriented to person, 2024, [facility] but not offering many details.    [facility] TDC --> removed this admission, site no issue     Temp cath R femoral vein removed, dressing c/d/i      Renal-related home meds: - sensipar 120 at bedtime - gabapentin 100mg  - renvavite - renvela 3 ac tid      OP HD: MWF [facility] 4h  400/1.5    77kg   2/3 bath  [facility] TDC    Heparin none - last OP HD 10/7, post wt 74.6kg - coming off 75- 76kg - misses HD once every other week approx - rocaltrol 1.50 mcg  - no esa    CXR 10/12- no edema, no active disease   Assessment/ Plan:  AMS -  Improved but still not at baseline.  MRI pending.  Sepsis - hx of MRSA bacteremia in 03/2023. On vanc. BCx's + for MRSA 10/12, 10/13 NGTD at 4 days. X rays of R elbow and foot 10/14 ok.  TEE 10/16 with vegetations - CT surgery requests repeat TEE 2 wks. ID is following and rec cath holiday - catheter out 10/16 and with repeat cultures clear, afeb x 48h now will request [facility] placement tomorrow with IR.   ESRD - on HD MWF. Tolerating HD fine.  HTN/ BP - BP's are soft in the 90s- 110 range but tolerating HD. Volume - no vol excess, at his dry wt. CXR clear.  UF with HD.  Anemia esrd - Hb 9s. No esa at OP unit - start low dose here. No IV iron with  endocarditis.  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 Resumed po vdra mwf. Resumed binder.     Estill Bakes MD [facility] Pager [phone]   Recent Labs  Lab 07/09/23 2231 07/10/23 0014 07/10/23 0142 07/10/23 0551 07/14/23 0910 07/15/23 0609  HGB 10.2*   < > 10.0*   < > 9.2* 9.3*  ALBUMIN 2.8*  --  2.6*  --   --   --   CALCIUM 9.5  --  9.0   < > 8.1* 8.3*  PHOS  --   --  7.4*  --   --  5.2*  CREATININE 16.48*   < > 15.81*   < > 11.70* 8.54*  K 5.0   < > 4.8   < > 4.3 4.1   < > = values in this interval not displayed.   No results for input(s): "IRON", "TIBC", "FERRITIN" in the last 168 hours. Inpatient medications:  calcitRIOL  1.5 mcg Oral Q M,W,F-HD   Chlorhexidine Gluconate Cloth  6  each Topical Q0600   cinacalcet  120 mg Oral Q supper   famotidine  10 mg Oral Daily   feeding supplement (GLUCERNA SHAKE)  237 mL Oral TID BM   gabapentin  100 mg Oral TID   heparin  5,000 Units Subcutaneous Q8H   insulin aspart  0-6 Units Subcutaneous TID WC   insulin glargine-yfgn  5 Units Subcutaneous Daily   multivitamin with minerals  1 tablet Oral QHS   sevelamer carbonate  2,400 mg Oral TID WC    vancomycin Stopped (07/14/23 1842)    acetaminophen, dextrose, docusate sodium, heparin sodium (porcine), mouth rinse, polyethylene glycol, promethazine

## 2023-07-15 NOTE — Plan of Care (Signed)
  Problem: Education: Goal: Ability to describe self-care measures that may prevent or decrease complications (Diabetes Survival Skills Education) will improve Outcome: Progressing Goal: Individualized Educational Video(s) Outcome: Progressing   Problem: Coping: Goal: Ability to adjust to condition or change in health will improve Outcome: Progressing   Problem: Fluid Volume: Goal: Ability to maintain a balanced intake and output will improve Outcome: Progressing   Problem: Health Behavior/Discharge Planning: Goal: Ability to identify and utilize available resources and services will improve Outcome: Progressing Goal: Ability to manage health-related needs will improve Outcome: Progressing   Problem: Metabolic: Goal: Ability to maintain appropriate glucose levels will improve Outcome: Progressing   Problem: Nutritional: Goal: Maintenance of adequate nutrition will improve Outcome: Progressing Goal: Progress toward achieving an optimal weight will improve Outcome: Progressing   Problem: Skin Integrity: Goal: Risk for impaired skin integrity will decrease Outcome: Progressing   Problem: Tissue Perfusion: Goal: Adequacy of tissue perfusion will improve Outcome: Progressing   Problem: Education: Goal: Ability to describe self-care measures that may prevent or decrease complications (Diabetes Survival Skills Education) will improve Outcome: Progressing Goal: Individualized Educational Video(s) Outcome: Progressing   Problem: Cardiac: Goal: Ability to maintain an adequate cardiac output will improve Outcome: Progressing   Problem: Health Behavior/Discharge Planning: Goal: Ability to identify and utilize available resources and services will improve Outcome: Progressing Goal: Ability to manage health-related needs will improve Outcome: Progressing   Problem: Fluid Volume: Goal: Ability to achieve a balanced intake and output will improve Outcome: Progressing    Problem: Metabolic: Goal: Ability to maintain appropriate glucose levels will improve Outcome: Progressing   Problem: Nutritional: Goal: Maintenance of adequate nutrition will improve Outcome: Progressing Goal: Maintenance of adequate weight for body size and type will improve Outcome: Progressing   Problem: Respiratory: Goal: Will regain and/or maintain adequate ventilation Outcome: Progressing   Problem: Urinary Elimination: Goal: Ability to achieve and maintain adequate renal perfusion and functioning will improve Outcome: Progressing   Problem: Education: Goal: Knowledge of General Education information will improve Description: Including pain rating scale, medication(s)/side effects and non-pharmacologic comfort measures Outcome: Progressing   Problem: Health Behavior/Discharge Planning: Goal: Ability to manage health-related needs will improve Outcome: Progressing   Problem: Clinical Measurements: Goal: Ability to maintain clinical measurements within normal limits will improve Outcome: Progressing Goal: Will remain free from infection Outcome: Progressing Goal: Diagnostic test results will improve Outcome: Progressing Goal: Respiratory complications will improve Outcome: Progressing Goal: Cardiovascular complication will be avoided Outcome: Progressing   Problem: Activity: Goal: Risk for activity intolerance will decrease Outcome: Progressing   Problem: Nutrition: Goal: Adequate nutrition will be maintained Outcome: Progressing   Problem: Coping: Goal: Level of anxiety will decrease Outcome: Progressing   Problem: Elimination: Goal: Will not experience complications related to bowel motility Outcome: Progressing Goal: Will not experience complications related to urinary retention Outcome: Progressing   Problem: Pain Managment: Goal: General experience of comfort will improve Outcome: Progressing   Problem: Safety: Goal: Ability to remain free  from injury will improve Outcome: Progressing   Problem: Skin Integrity: Goal: Risk for impaired skin integrity will decrease Outcome: Progressing   Problem: Education: Goal: Understanding of post-operative needs will improve Outcome: Progressing Goal: Individualized Educational Video(s) Outcome: Progressing   Problem: Clinical Measurements: Goal: Postoperative complications will be avoided or minimized Outcome: Progressing   Problem: Respiratory: Goal: Will regain and/or maintain adequate ventilation Outcome: Progressing

## 2023-07-15 NOTE — Consult Note (Addendum)
NEUROLOGY CONSULT NOTE   Date of service: July 15, 2023 Patient Name: [name] MRN:  [id number] DOB:  [birth year] Chief Complaint: "left arm weakness" Requesting Provider: Burnadette Pop, MD  History of Present Illness  [name] is a 42 y.o. male with past medical history of ESRD on HD MWF, right diabetic foot ulcer complicated by MRSA bacteremia 7/24 likely 2/2 to HD line infection, insulin dependent diabetes, gastroparesis, hx of left AKA, who presents with AMS on 10/11 and found to have septic shock with hypotension, leukocytosis and encephalopathy. He was admitted in July with MRSA bacteremia. He was treated with vancomycin, refused echo and left AMA.  Blood cultures this admission grew MRSA bacteremia. TEE showed large vegetation measuring 2 cm x 0.8 cm on the posterior mitral valve leaflet extending into the mitral valve annulus with high mobility. Also 2 mobile masses on the anterior leaflet of the tricuspid valve and a 3rd mass on the posterior tricuspid valve  He mentioned left arm weakness for some time. MRI completed and showed right MCA infarct with acute infarcts in left occipital and bilateral cerebellum. LKW: unclear IVT: no - unclear LKW EVT: no - unclear LKW, as well as poor MRS Modified rankin score: 4-Needs assistance to walk and tend to bodily needs  ROS  Comprehensive ROS performed and pertinent positives documented in HPI  Past History   Past Medical History:  Diagnosis Date   Anemia    Cellulitis and abscess of toe of left foot    Diabetes mellitus without complication ([name])    Diabetic gastroparesis ([name])    Diabetic ulcer of left midfoot associated with diabetes mellitus due to underlying condition, with necrosis of bone ([name])    Dialysis patient ([name])    ESRD (end stage renal disease) ([name])    Hypertension    Sepsis ([name])     Past Surgical History:  Procedure Laterality Date   AMPUTATION Right 02/02/2018   Procedure: RIGHT FIFTH TOE AND  METATARSAL AMPUTATION. Filetted toe flap metatarsal resection. Debridement Plantar Foot wound;  Surgeon: Park Liter, DPM;  Location: [facility] OR;  Service: Podiatry;  Laterality: Right;   AMPUTATION Left 08/20/2018   Procedure: FIFTH METATARSAL BONE BIOPSY;  Surgeon: Park Liter, DPM;  Location: [facility] OR;  Service: Podiatry;  Laterality: Left;   AMPUTATION Left 10/28/2018   Procedure: LEFT GREAT TOE AMPUTATION;  Surgeon: Park Liter, DPM;  Location: [facility] OR;  Service: Podiatry;  Laterality: Left;   AMPUTATION Left 09/16/2020   Procedure: AMPUTATION BELOW KNEE;  Surgeon: Larina Earthly, MD;  Location: [facility] OR;  Service: Vascular;  Laterality: Left;   AMPUTATION Left 10/15/2020   Procedure: AMPUTATION ABOVE KNEE;  Surgeon: Sherren Kerns, MD;  Location: [facility] OR;  Service: Vascular;  Laterality: Left;   APPLICATION OF WOUND VAC  02/02/2018   Procedure: APPLICATION OF WOUND VAC  Right Foot;  Surgeon: Park Liter, DPM;  Location: [facility] OR;  Service: Podiatry;;   APPLICATION OF WOUND VAC Left 10/28/2018   Procedure: APPLICATION OF WOUND VAC LEFT TOE;  Surgeon: Park Liter, DPM;  Location: [facility] OR;  Service: Podiatry;  Laterality: Left;   APPLICATION OF WOUND VAC Left 11/01/2018   Procedure: APPLICATION OF WOUND VAC;  Surgeon: Park Liter, DPM;  Location: [facility] OR;  Service: Podiatry;  Laterality: Left;   AV FISTULA PLACEMENT     left arm.   AV FISTULA PLACEMENT Right 12/22/2016   Procedure: INSERTION OF ARTERIOVENOUS (AV) GORE-TEX GRAFT  ARM;  Surgeon: Sherren Kerns, MD;  Location: [facility] OR;  Service: Vascular;  Laterality: Right;   AV FISTULA PLACEMENT Left 05/26/2018   Procedure: INSERTION OF  ARTERIOVENOUS (AV) GORE-TEX GRAFT LEFT ARM;  Surgeon: Nada Libman, MD;  Location: [facility] OR;  Service: Vascular;  Laterality: Left;   EYE SURGERY     I & D EXTREMITY Right 10/31/2017   Procedure: IRRIGATION AND DEBRIDEMENT RIGHT FOOT;  Surgeon: Park Liter, DPM;  Location: [facility] OR;  Service: Podiatry;   Laterality: Right;   I & D EXTREMITY Left 08/20/2018   Procedure: IRRIGATION AND DEBRIDEMENT EXTREMITY WITH SECONDARY WOUND CLOSUREAND APPLICATION OF WOUND VAC LEFT FOOT;  Surgeon: Park Liter, DPM;  Location: [facility] OR;  Service: Podiatry;  Laterality: Left;   I & D EXTREMITY Left 10/20/2018   Procedure: IRRIGATION AND DEBRIDEMENT LEFT FOOT  DEBRIDEMENT LATERAL FOOT WOUND;  Surgeon: Park Liter, DPM;  Location: [facility] OR;  Service: Podiatry;  Laterality: Left;   I & D EXTREMITY Left 10/28/2018   Procedure: IRRIGATION AND DEBRIDEMENT LEFT TOE;  Surgeon: Park Liter, DPM;  Location: [facility] OR;  Service: Podiatry;  Laterality: Left;   I & D EXTREMITY Left 09/14/2020   Procedure: IRRIGATION AND DEBRIDEMENT WRIST;  Surgeon: Betha Loa, MD;  Location: [facility] OR;  Service: Orthopedics;  Laterality: Left;   INSERTION OF DIALYSIS CATHETER     Right subclavian   IR AV DIALY SHUNT INTRO NEEDLE/INTRACATH INITIAL W/PTA/IMG RIGHT Right 02/05/2018   IR FLUORO GUIDE CV LINE LEFT  04/14/2023   IR FLUORO GUIDE CV LINE RIGHT  01/31/2020   IR FLUORO GUIDE CV LINE RIGHT  01/30/2022   IR PTA VENOUS EXCEPT DIALYSIS CIRCUIT  01/30/2022   IR REMOVAL TUN CV CATH W/O FL  04/12/2023   IR THROMBECTOMY AV FISTULA W/THROMBOLYSIS/PTA INC/SHUNT/IMG LEFT Left 08/24/2018   IR THROMBECTOMY AV FISTULA W/THROMBOLYSIS/PTA INC/SHUNT/IMG LEFT Left 01/06/2019   IR US GUIDE VASC ACCESS LEFT  08/24/2018   IR US GUIDE VASC ACCESS LEFT  04/14/2023   IR US GUIDE VASC ACCESS RIGHT  02/05/2018   IR US GUIDE VASC ACCESS RIGHT  01/31/2020   IR VENOCAVAGRAM SVC  01/30/2022   IRRIGATION AND DEBRIDEMENT FOOT Right 10/23/2018   Procedure: Irrigation and Debridement to tendon, Left Foot;  Surgeon: Park Liter, DPM;  Location: [facility] OR;  Service: Podiatry;  Laterality: Right;   IRRIGATION AND DEBRIDEMENT FOOT Left 11/01/2018   Procedure: IRRIGATION AND DEBRIDEMENT PARTIAL WOUND CLOSURE LOCAL TISSUE TRANSFER AND FLAP ROTATION, LEFT FOOT;  Surgeon: Park Liter, DPM;  Location: [facility] OR;  Service: Podiatry;  Laterality: Left;   TRANSESOPHAGEAL ECHOCARDIOGRAM (CATH LAB) N/A 07/14/2023   Procedure: TRANSESOPHAGEAL ECHOCARDIOGRAM;  Surgeon: Thomasene Ripple, DO;  Location: [facility] INVASIVE CV LAB;  Service: Cardiovascular;  Laterality: N/A;   TRANSMETATARSAL AMPUTATION N/A 08/18/2018   Procedure: IRRIGATION AND DEBRIDEMENT OF LEFT 5TH TOE AND TRANSMETATARSAL, WITH PARTICAL LEFT 5TH TOE AND METATARSAL AMPUTATION, BONE BIOPSY, WOUND VAC APPLICATION.;  Surgeon: Park Liter, DPM;  Location: [facility] OR;  Service: Podiatry;  Laterality: N/A;   UPPER EXTREMITY VENOGRAPHY N/A 11/16/2016   Procedure: Upper Extremity Venography - Right Central;  Surgeon: Sherren Kerns, MD;  Location: [facility] INVASIVE CV LAB;  Service: Cardiovascular;  Laterality: N/A;   UPPER EXTREMITY VENOGRAPHY N/A 05/25/2018   Procedure: UPPER EXTREMITY VENOGRAPHY - Bilateral;  Surgeon: Cephus Shelling, MD;  Location: [facility] INVASIVE CV LAB;  Service: Cardiovascular;  Laterality: N/A;    Family  History: Family History  Problem Relation Age of Onset   Diabetes Mellitus II Other    Diabetes Father    Renal Disease Father        ESRD    Social History  reports that he has never smoked. He has never used smokeless tobacco. He reports that he does not drink alcohol and does not use drugs.  No Known Allergies  Medications   Current Facility-Administered Medications:    acetaminophen (TYLENOL) tablet 650 mg, 650 mg, Oral, Q6H PRN, Olalere, Adewale A, MD, 650 mg at 07/15/23 0272   calcitRIOL (ROCALTROL) capsule 1.5 mcg, 1.5 mcg, Oral, Q M,W,F-HD, Cheri Fowler, MD, 1.5 mcg at 07/14/23 1710   [START ON 07/16/2023] ceFAZolin (ANCEF) IVPB 2g/100 mL premix, 2 g, Intravenous, to XRAY, Turpin, Pamela, PA-C   Chlorhexidine Gluconate Cloth 2 % PADS 6 each, 6 each, Topical, Q0600, Delano Metz, MD, 6 each at 07/15/23 0526   cinacalcet (SENSIPAR) tablet 120 mg, 120 mg, Oral, Q supper, Merrily Pew, Garnet Sierras, MD, 120 mg  at 07/13/23 1653   [START ON 07/16/2023] Darbepoetin Alfa (ARANESP) injection 40 mcg, 40 mcg, Subcutaneous, Q Fri-1800, Tyler Pita, MD   dextrose 50 % solution 0-50 mL, 0-50 mL, Intravenous, PRN, Tilden Fossa, MD   docusate sodium (COLACE) capsule 100 mg, 100 mg, Oral, BID PRN, Lonzo Candy, Flonnie Hailstone, MD   famotidine (PEPCID) tablet 10 mg, 10 mg, Oral, Daily, Albustami, Flonnie Hailstone, MD, 10 mg at 07/15/23 0859   feeding supplement (GLUCERNA SHAKE) (GLUCERNA SHAKE) liquid 237 mL, 237 mL, Oral, TID BM, Harris, Whitney D, NP, 237 mL at 07/15/23 0900   gabapentin (NEURONTIN) capsule 100 mg, 100 mg, Oral, TID, Olalere, Adewale A, MD, 100 mg at 07/15/23 0859   heparin injection 5,000 Units, 5,000 Units, Subcutaneous, Q8H, Albustami, Flonnie Hailstone, MD, 5,000 Units at 07/15/23 1433   heparin sodium (porcine) injection 1,000-2,000 Units, 1,000-2,000 Units, Intracatheter, PRN, Olalere, Adewale A, MD   insulin aspart (novoLOG) injection 0-6 Units, 0-6 Units, Subcutaneous, TID WC, Olalere, Adewale A, MD, 4 Units at 07/15/23 0900   [START ON 07/16/2023] insulin glargine-yfgn (SEMGLEE) injection 10 Units, 10 Units, Subcutaneous, Daily, Adhikari, Amrit, MD   multivitamin with minerals tablet 1 tablet, 1 tablet, Oral, QHS, Chand, Garnet Sierras, MD, 1 tablet at 07/14/23 2050   Oral care mouth rinse, 15 mL, Mouth Rinse, PRN, Albustami, Flonnie Hailstone, MD   polyethylene glycol (MIRALAX / GLYCOLAX) packet 17 g, 17 g, Oral, Daily PRN, Albustami, Flonnie Hailstone, MD   promethazine (PHENERGAN) tablet 12.5 mg, 12.5 mg, Oral, Q8H PRN, Olalere, Adewale A, MD, 12.5 mg at 07/15/23 0921   sevelamer carbonate (RENVELA) tablet 2,400 mg, 2,400 mg, Oral, TID WC, Tyler Pita, MD, 2,400 mg at 07/15/23 0859   vancomycin (VANCOREADY) IVPB 750 mg/150 mL, 750 mg, Intravenous, Q M,W,F-HD, Quenton Fetter, RPH, Stopped at 07/14/23 1842  Vitals   Vitals:   07/14/23 2358 07/15/23 0437 07/15/23 0500 07/15/23 0800  BP: (!) 89/48 110/72  (!) 97/54  Pulse: 95 96   91  Resp: 18 18  18   Temp: 98.3 F (36.8 C) 98.1 F (36.7 C)  97.7 F (36.5 C)  TempSrc:    Oral  SpO2: 100% 99%  96%  Weight:   74.5 kg     Body mass index is 25.72 kg/m.  Physical Exam   Constitutional: appears older than stated age, sleepy Cardiovascular: Normal rate and regular rhythm.  Respiratory: Effort normal, non-labored breathing.  GI: Soft.  No distension. There  is no tenderness.  Skin: abrasions present on right lower extremity  Neurologic Examination   Mental Status: Patient is awake, sleepy, oriented x3 Poor attention Cranial Nerves: II: Pupils equal, round, and reactive to light.   III,IV, VI: EOMI without ptosis or diploplia.  V: Facial sensation is symmetric to light touch. VII: Facial movement is asymmetric. Right sided facial droop VIII: Hearing is intact to voice X: Uvula elevates symmetrically XI: Shoulder shrug is asymmetric.unable to move left shoulder XII: Tongue is midline without atrophy or fasciculations.  Motor: poor effort thorughout, at least 0/5 in left upper extremity, 4/5 in right upper extremity, 4/5 in right lower extremity Sensory: Sensation is grossly intact decreased in left upper extremity Plantars: Toes are downgoing bilaterally. Cerebellar: unable to follow multi-step commands NIHSS 1a Level of Conscious.:1  1b LOC Questions: 0 1c LOC Commands: 0 2 Best Gaze: 0 3 Visual: 0 4 Facial Palsy: 1  5a Motor Arm - left: 4  5b Motor Arm - Right:1  6a Motor Leg - Left: 1  6b Motor Leg - Right: 1 7 Limb Ataxia: 0 8 Sensory: 2 9 Best Language:0  10 Dysarthria: 1 11 Extinct. and Inatten. 0  TOTAL: 12   Labs   CBC:  Recent Labs  Lab 07/09/23 2231 07/10/23 0014 07/14/23 0910 07/15/23 0609  WBC 20.7*   < > 15.3* 13.7*  NEUTROABS 18.6*  --   --   --   HGB 10.2*   < > 9.2* 9.3*  HCT 33.3*   < > 28.4* 29.3*  MCV 92.0   < > 87.1 88.8  PLT 155   < > 108* 166   < > = values in this interval not displayed.   Basic Metabolic  Panel:     Latest Ref Rng & Units 07/15/2023    6:09 AM 07/14/2023    9:10 AM 07/13/2023    5:07 AM  BMP  Glucose 70 - 99 mg/dL 098  119  89   BUN 6 - 20 mg/dL 49  62  51   Creatinine 0.61 - 1.24 mg/dL 1.47  82.95  62.13   Sodium 135 - 145 mmol/L 131  127  132   Potassium 3.5 - 5.1 mmol/L 4.1  4.3  3.6   Chloride 98 - 111 mmol/L 89  92  95   CO2 22 - 32 mmol/L 14  16  21    Calcium 8.9 - 10.3 mg/dL 8.3  8.1  9.1     [id number]:  Lab Results  Component Value Date   HGBA1C 8.9 (H) 07/10/2023    MRI Brain(Personally reviewed): Multi territory infarct consistent with emboli in right MCA territory, small acute infarcts in left occipital and bilateral cerebellum  A1c 8.9 10/12  Impression   [name] is a 42 y.o. male with past medical history of ESRD on HD MWF, right diabetic foot ulcer, MRSA bacteremia 7/24 likely 2/2 to HD line infection, insulin dependent diabetes, gastroparesis, hx of left AKA, who presents with septic shock found to have MRSA bacteremia complicated by endocarditis with new left upper extremity weakness and right facial droop, MRI brain with large territory right MCA, left occipital and bilateral cerebellar stroke consistent with septic emboli.   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 Would recommend stopping DVT ppx with  lovenox/heparin - would recommend ongoing goals of care conversation. Patient does not currently have capacity to engage in discussion - neuro checks q 4 hrs - PT/ OT/ Speech - no clear LKW - no need for permissive HTN Stroke team will follow    Signed,  Memory Dance. Masters, D.O.  Internal Medicine Resident, PGY-3 [facility]  Pager: [phone] 3:37 PM, 07/15/2023     Attending  Neurohospitalist Addendum Patient seen and examined with APP/Resident. Agree with the history and physical as documented above. Agree with the plan as documented, which I helped formulate. I have independently reviewed the chart, obtained history, review of systems and examined the patient.I have personally reviewed pertinent head/neck/spine imaging (CT/MRI). Plan d/w Dr. Renford Dills Please feel free to call with any questions.  -- Milon Dikes, MD Neurologist [facility] Pager: [phone]

## 2023-07-15 NOTE — Progress Notes (Signed)
Report Status PENDING  Incomplete  Culture, blood (Routine X 2) w Reflex to ID Panel     Status: None (Preliminary result)   Collection Time: 07/11/23  8:44 AM   Specimen: BLOOD RIGHT ARM  Result Value Ref Range Status   Specimen Description BLOOD RIGHT ARM  Final   Special Requests   Final    BOTTLES DRAWN AEROBIC ONLY Blood Culture adequate volume   Culture   Final    NO GROWTH 4 DAYS Performed at [facility], [address], [place], [name] [address]    Report Status PENDING  Incomplete     Radiology Studies: ECHO TEE  Result Date: 07/14/2023    TRANSESOPHOGEAL ECHO REPORT   Patient Name:   [name] Date of Exam: 07/14/2023 Medical Rec #:  [id number]     Height:       67.0 in Accession #:    [id number]    Weight:       166.9 lb Date of Birth:  Apr 06, 1981    BSA:          1.873 m Patient Age:    41 years      BP:           127/60 mmHg Patient Gender: M             HR:           92 bpm. Exam Location:  Inpatient Procedure: Transesophageal Echo, 3D Echo, Cardiac Doppler and Color Doppler Indications:    Bacteremia  History:        Patient has no prior history of Echocardiogram examinations.                  Aortic Valve Disease, Mitral Valve Disease, Pulmonic Valve                 Disease and Endocarditis; Signs/Symptoms:Bacteremia.  Sonographer:    Lucendia Herrlich RCS Referring Phys: [id number] Jeanella Craze PROCEDURE: After discussion of the risks and benefits of a TEE, an informed consent was obtained from the patient. The transesophogeal probe was passed without difficulty through the esophogus of the patient. Sedation performed by different physician. The patient's vital signs; including heart rate, blood pressure, and oxygen saturation; remained stable throughout the procedure. The patient developed no complications during the procedure.  IMPRESSIONS  1. Left ventricular ejection fraction, by estimation, is 30 to 35%. The left ventricle has moderately decreased function. The left ventricle has no regional wall motion abnormalities.  2. Right ventricular systolic function is normal. The right ventricular size is normal.  3. No left atrial/left atrial appendage thrombus was detected.  4. Large mass (2.00 cm x 0.88 cm ) on the posterior mitral leaflet which appears to be extending in the mitral annulus . Will benefit from cardiac CT morphology. The mitral valve is abnormal. Mild to moderate mitral valve regurgitation. No evidence of mitral stenosis.  5. There are two mobile masses visible on the anterior leaflet of the tricupid valve which also appears to have a perforation. Another mobile mass ( 0.68 cm x 0.67 cm) on the posterior tricupid leaflet. Moderate Tricuspid regurgitation which appears to be through the leaflet perforation . The tricuspid valve is abnormal.  6. The aortic valve is abnormal. Aortic valve regurgitation is not visualized. No aortic stenosis is present.  7. The inferior vena cava is normal in size with greater than 50% respiratory variability, suggesting right atrial pressure of 3 mmHg. Conclusion(s)/Recommendation(s): Findings  PROGRESS NOTE  [name]  JOA:[id number] DOB: 04-08-1981 DOA: 07/09/2023 PCP: [name], NP   Brief Narrative: Patient is a 42 year old male with history of ESRD on dialysis, insulin-dependent diabetes mellitus, gastroparesis, left AKA, hypertension who was brought here on 10/11 with altered mental status.  He was recently admitted here on July for sepsis secondary to right diabetic foot ulcer, cultures grew MRSA.  Dialysis catheter was removed and replaced.  He was treated with vancomycin, refused echo and left on 7/7 against  medical advice.  On 10/11, he was found to be confused and brought to ED.  When he was brought in, dialysis catheter was partially removed.  He was also found to be in DKA.  He started on insulin drip.  Lab work showed elevated leukocytes, lactate of 2.4.  He was hypotensive.  Admitted for septic shock.  Hospital course remarkable for finding of MRSA in blood cultures.  ID following.  Nephrology following for dialysis.  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  TEE done on 10/16 showed vegetation on multiple valves including big one on mitral valve, cardiothoracic surgery consulted.  Plan for conservative treatment with antibiotics.  Assessment & Plan:  Principal Problem:   MRSA bacteremia Active Problems:   DKA (diabetic ketoacidosis) ([name])   Altered mental status   Hemodialysis catheter infection ([name])   Right elbow pain   Endocarditis of mitral valve   ESRD on hemodialysis ([name])   Acute systolic CHF (congestive heart failure) ([name])   DCM (dilated cardiomyopathy) ([name])   Coronary artery calcification seen on CAT scan  Septic shock due to MRSA bacteremia: Was admitted on July and was found to have MRSA bacteremia and left AMA.  Presented with septic shock with hypotension, leukocytosis, encephalopathy.  Blood cultures again showed MRSA.  Currently on vancomycin.  Suspected HD line infection.ID following. Afebrile this  morning  Infective endocarditis: TEE done on 10/16 showed  vegetations on multiple valves but mainly in the mitral valve.  Cardiothoracic surgery consulted, recommending conservative management with antibiotics for 6 weeks, repeat TEE in 1 to 2 weeks. He may need to finish the antibiotics in the hospital  Combined systolic/diastolic CHF: Echo showed EF of 25 to 30%, severe decrease left ventricular function, global hypokinesis, grade 1 diastolic dysfunction.  2D echo showed filamentous structure on the ventricular side of the mitral valve, thickened aortic valve leaflets/annulus .  His last echo on 03/2019 showed normal ejection fraction. TEE showed multiple vegetations. He remains euvolemic today  DKA: A1c of 8.9.  Initially started on insulin drip, now transition to long-acting and sliding scale.  Monitor blood sugars.  Diabetic coordinator following.  Sugars fluctuating  Acute encephalopathy/left upper extremity weakness: Likely secondary to sepsis, DKA and hospital-acquired delirium..  Mental status improving.  Avoid sedation.  He remains alert, awake, obeys commands, and mostly oriented but easily distractible and forgetful. Also noticed to have left arm weakness.  On reviewing the chart, it has not been mentioned anywhere.  Patient says he has this left arm weakness for some time.  Not sure about the exact timing.  Will get MRI of the brain, MRI cervical spine  ESRD on dialysis: Dialysis catheter were partially out when he presented.  Suspected HD line infection.  Dialysis catheter removed.  Had temporary femoral dialysis catheter,also removed.  Currently on catheter holiday.  Nephrology planning to consult IR tomorrow for temporary dialysis catheter placement  Diarrhea: C. difficile ruled out  Electrolyte imbalances: Continue to monitor and supplement  Report Status PENDING  Incomplete  Culture, blood (Routine X 2) w Reflex to ID Panel     Status: None (Preliminary result)   Collection Time: 07/11/23  8:44 AM   Specimen: BLOOD RIGHT ARM  Result Value Ref Range Status   Specimen Description BLOOD RIGHT ARM  Final   Special Requests   Final    BOTTLES DRAWN AEROBIC ONLY Blood Culture adequate volume   Culture   Final    NO GROWTH 4 DAYS Performed at [facility], [address], [place], [name] [address]    Report Status PENDING  Incomplete     Radiology Studies: ECHO TEE  Result Date: 07/14/2023    TRANSESOPHOGEAL ECHO REPORT   Patient Name:   [name] Date of Exam: 07/14/2023 Medical Rec #:  [id number]     Height:       67.0 in Accession #:    [id number]    Weight:       166.9 lb Date of Birth:  Apr 06, 1981    BSA:          1.873 m Patient Age:    41 years      BP:           127/60 mmHg Patient Gender: M             HR:           92 bpm. Exam Location:  Inpatient Procedure: Transesophageal Echo, 3D Echo, Cardiac Doppler and Color Doppler Indications:    Bacteremia  History:        Patient has no prior history of Echocardiogram examinations.                  Aortic Valve Disease, Mitral Valve Disease, Pulmonic Valve                 Disease and Endocarditis; Signs/Symptoms:Bacteremia.  Sonographer:    Lucendia Herrlich RCS Referring Phys: [id number] Jeanella Craze PROCEDURE: After discussion of the risks and benefits of a TEE, an informed consent was obtained from the patient. The transesophogeal probe was passed without difficulty through the esophogus of the patient. Sedation performed by different physician. The patient's vital signs; including heart rate, blood pressure, and oxygen saturation; remained stable throughout the procedure. The patient developed no complications during the procedure.  IMPRESSIONS  1. Left ventricular ejection fraction, by estimation, is 30 to 35%. The left ventricle has moderately decreased function. The left ventricle has no regional wall motion abnormalities.  2. Right ventricular systolic function is normal. The right ventricular size is normal.  3. No left atrial/left atrial appendage thrombus was detected.  4. Large mass (2.00 cm x 0.88 cm ) on the posterior mitral leaflet which appears to be extending in the mitral annulus . Will benefit from cardiac CT morphology. The mitral valve is abnormal. Mild to moderate mitral valve regurgitation. No evidence of mitral stenosis.  5. There are two mobile masses visible on the anterior leaflet of the tricupid valve which also appears to have a perforation. Another mobile mass ( 0.68 cm x 0.67 cm) on the posterior tricupid leaflet. Moderate Tricuspid regurgitation which appears to be through the leaflet perforation . The tricuspid valve is abnormal.  6. The aortic valve is abnormal. Aortic valve regurgitation is not visualized. No aortic stenosis is present.  7. The inferior vena cava is normal in size with greater than 50% respiratory variability, suggesting right atrial pressure of 3 mmHg. Conclusion(s)/Recommendation(s): Findings  PROGRESS NOTE  [name]  JOA:[id number] DOB: 04-08-1981 DOA: 07/09/2023 PCP: [name], NP   Brief Narrative: Patient is a 42 year old male with history of ESRD on dialysis, insulin-dependent diabetes mellitus, gastroparesis, left AKA, hypertension who was brought here on 10/11 with altered mental status.  He was recently admitted here on July for sepsis secondary to right diabetic foot ulcer, cultures grew MRSA.  Dialysis catheter was removed and replaced.  He was treated with vancomycin, refused echo and left on 7/7 against  medical advice.  On 10/11, he was found to be confused and brought to ED.  When he was brought in, dialysis catheter was partially removed.  He was also found to be in DKA.  He started on insulin drip.  Lab work showed elevated leukocytes, lactate of 2.4.  He was hypotensive.  Admitted for septic shock.  Hospital course remarkable for finding of MRSA in blood cultures.  ID following.  Nephrology following for dialysis.  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  TEE done on 10/16 showed vegetation on multiple valves including big one on mitral valve, cardiothoracic surgery consulted.  Plan for conservative treatment with antibiotics.  Assessment & Plan:  Principal Problem:   MRSA bacteremia Active Problems:   DKA (diabetic ketoacidosis) ([name])   Altered mental status   Hemodialysis catheter infection ([name])   Right elbow pain   Endocarditis of mitral valve   ESRD on hemodialysis ([name])   Acute systolic CHF (congestive heart failure) ([name])   DCM (dilated cardiomyopathy) ([name])   Coronary artery calcification seen on CAT scan  Septic shock due to MRSA bacteremia: Was admitted on July and was found to have MRSA bacteremia and left AMA.  Presented with septic shock with hypotension, leukocytosis, encephalopathy.  Blood cultures again showed MRSA.  Currently on vancomycin.  Suspected HD line infection.ID following. Afebrile this  morning  Infective endocarditis: TEE done on 10/16 showed  vegetations on multiple valves but mainly in the mitral valve.  Cardiothoracic surgery consulted, recommending conservative management with antibiotics for 6 weeks, repeat TEE in 1 to 2 weeks. He may need to finish the antibiotics in the hospital  Combined systolic/diastolic CHF: Echo showed EF of 25 to 30%, severe decrease left ventricular function, global hypokinesis, grade 1 diastolic dysfunction.  2D echo showed filamentous structure on the ventricular side of the mitral valve, thickened aortic valve leaflets/annulus .  His last echo on 03/2019 showed normal ejection fraction. TEE showed multiple vegetations. He remains euvolemic today  DKA: A1c of 8.9.  Initially started on insulin drip, now transition to long-acting and sliding scale.  Monitor blood sugars.  Diabetic coordinator following.  Sugars fluctuating  Acute encephalopathy/left upper extremity weakness: Likely secondary to sepsis, DKA and hospital-acquired delirium..  Mental status improving.  Avoid sedation.  He remains alert, awake, obeys commands, and mostly oriented but easily distractible and forgetful. Also noticed to have left arm weakness.  On reviewing the chart, it has not been mentioned anywhere.  Patient says he has this left arm weakness for some time.  Not sure about the exact timing.  Will get MRI of the brain, MRI cervical spine  ESRD on dialysis: Dialysis catheter were partially out when he presented.  Suspected HD line infection.  Dialysis catheter removed.  Had temporary femoral dialysis catheter,also removed.  Currently on catheter holiday.  Nephrology planning to consult IR tomorrow for temporary dialysis catheter placement  Diarrhea: C. difficile ruled out  Electrolyte imbalances: Continue to monitor and supplement  Report Status PENDING  Incomplete  Culture, blood (Routine X 2) w Reflex to ID Panel     Status: None (Preliminary result)   Collection Time: 07/11/23  8:44 AM   Specimen: BLOOD RIGHT ARM  Result Value Ref Range Status   Specimen Description BLOOD RIGHT ARM  Final   Special Requests   Final    BOTTLES DRAWN AEROBIC ONLY Blood Culture adequate volume   Culture   Final    NO GROWTH 4 DAYS Performed at [facility], [address], [place], [name] [address]    Report Status PENDING  Incomplete     Radiology Studies: ECHO TEE  Result Date: 07/14/2023    TRANSESOPHOGEAL ECHO REPORT   Patient Name:   [name] Date of Exam: 07/14/2023 Medical Rec #:  [id number]     Height:       67.0 in Accession #:    [id number]    Weight:       166.9 lb Date of Birth:  Apr 06, 1981    BSA:          1.873 m Patient Age:    41 years      BP:           127/60 mmHg Patient Gender: M             HR:           92 bpm. Exam Location:  Inpatient Procedure: Transesophageal Echo, 3D Echo, Cardiac Doppler and Color Doppler Indications:    Bacteremia  History:        Patient has no prior history of Echocardiogram examinations.                  Aortic Valve Disease, Mitral Valve Disease, Pulmonic Valve                 Disease and Endocarditis; Signs/Symptoms:Bacteremia.  Sonographer:    Lucendia Herrlich RCS Referring Phys: [id number] Jeanella Craze PROCEDURE: After discussion of the risks and benefits of a TEE, an informed consent was obtained from the patient. The transesophogeal probe was passed without difficulty through the esophogus of the patient. Sedation performed by different physician. The patient's vital signs; including heart rate, blood pressure, and oxygen saturation; remained stable throughout the procedure. The patient developed no complications during the procedure.  IMPRESSIONS  1. Left ventricular ejection fraction, by estimation, is 30 to 35%. The left ventricle has moderately decreased function. The left ventricle has no regional wall motion abnormalities.  2. Right ventricular systolic function is normal. The right ventricular size is normal.  3. No left atrial/left atrial appendage thrombus was detected.  4. Large mass (2.00 cm x 0.88 cm ) on the posterior mitral leaflet which appears to be extending in the mitral annulus . Will benefit from cardiac CT morphology. The mitral valve is abnormal. Mild to moderate mitral valve regurgitation. No evidence of mitral stenosis.  5. There are two mobile masses visible on the anterior leaflet of the tricupid valve which also appears to have a perforation. Another mobile mass ( 0.68 cm x 0.67 cm) on the posterior tricupid leaflet. Moderate Tricuspid regurgitation which appears to be through the leaflet perforation . The tricuspid valve is abnormal.  6. The aortic valve is abnormal. Aortic valve regurgitation is not visualized. No aortic stenosis is present.  7. The inferior vena cava is normal in size with greater than 50% respiratory variability, suggesting right atrial pressure of 3 mmHg. Conclusion(s)/Recommendation(s): Findings  Report Status PENDING  Incomplete  Culture, blood (Routine X 2) w Reflex to ID Panel     Status: None (Preliminary result)   Collection Time: 07/11/23  8:44 AM   Specimen: BLOOD RIGHT ARM  Result Value Ref Range Status   Specimen Description BLOOD RIGHT ARM  Final   Special Requests   Final    BOTTLES DRAWN AEROBIC ONLY Blood Culture adequate volume   Culture   Final    NO GROWTH 4 DAYS Performed at [facility], [address], [place], [name] [address]    Report Status PENDING  Incomplete     Radiology Studies: ECHO TEE  Result Date: 07/14/2023    TRANSESOPHOGEAL ECHO REPORT   Patient Name:   [name] Date of Exam: 07/14/2023 Medical Rec #:  [id number]     Height:       67.0 in Accession #:    [id number]    Weight:       166.9 lb Date of Birth:  Apr 06, 1981    BSA:          1.873 m Patient Age:    41 years      BP:           127/60 mmHg Patient Gender: M             HR:           92 bpm. Exam Location:  Inpatient Procedure: Transesophageal Echo, 3D Echo, Cardiac Doppler and Color Doppler Indications:    Bacteremia  History:        Patient has no prior history of Echocardiogram examinations.                  Aortic Valve Disease, Mitral Valve Disease, Pulmonic Valve                 Disease and Endocarditis; Signs/Symptoms:Bacteremia.  Sonographer:    Lucendia Herrlich RCS Referring Phys: [id number] Jeanella Craze PROCEDURE: After discussion of the risks and benefits of a TEE, an informed consent was obtained from the patient. The transesophogeal probe was passed without difficulty through the esophogus of the patient. Sedation performed by different physician. The patient's vital signs; including heart rate, blood pressure, and oxygen saturation; remained stable throughout the procedure. The patient developed no complications during the procedure.  IMPRESSIONS  1. Left ventricular ejection fraction, by estimation, is 30 to 35%. The left ventricle has moderately decreased function. The left ventricle has no regional wall motion abnormalities.  2. Right ventricular systolic function is normal. The right ventricular size is normal.  3. No left atrial/left atrial appendage thrombus was detected.  4. Large mass (2.00 cm x 0.88 cm ) on the posterior mitral leaflet which appears to be extending in the mitral annulus . Will benefit from cardiac CT morphology. The mitral valve is abnormal. Mild to moderate mitral valve regurgitation. No evidence of mitral stenosis.  5. There are two mobile masses visible on the anterior leaflet of the tricupid valve which also appears to have a perforation. Another mobile mass ( 0.68 cm x 0.67 cm) on the posterior tricupid leaflet. Moderate Tricuspid regurgitation which appears to be through the leaflet perforation . The tricuspid valve is abnormal.  6. The aortic valve is abnormal. Aortic valve regurgitation is not visualized. No aortic stenosis is present.  7. The inferior vena cava is normal in size with greater than 50% respiratory variability, suggesting right atrial pressure of 3 mmHg. Conclusion(s)/Recommendation(s): Findings  Report Status PENDING  Incomplete  Culture, blood (Routine X 2) w Reflex to ID Panel     Status: None (Preliminary result)   Collection Time: 07/11/23  8:44 AM   Specimen: BLOOD RIGHT ARM  Result Value Ref Range Status   Specimen Description BLOOD RIGHT ARM  Final   Special Requests   Final    BOTTLES DRAWN AEROBIC ONLY Blood Culture adequate volume   Culture   Final    NO GROWTH 4 DAYS Performed at [facility], [address], [place], [name] [address]    Report Status PENDING  Incomplete     Radiology Studies: ECHO TEE  Result Date: 07/14/2023    TRANSESOPHOGEAL ECHO REPORT   Patient Name:   [name] Date of Exam: 07/14/2023 Medical Rec #:  [id number]     Height:       67.0 in Accession #:    [id number]    Weight:       166.9 lb Date of Birth:  Apr 06, 1981    BSA:          1.873 m Patient Age:    41 years      BP:           127/60 mmHg Patient Gender: M             HR:           92 bpm. Exam Location:  Inpatient Procedure: Transesophageal Echo, 3D Echo, Cardiac Doppler and Color Doppler Indications:    Bacteremia  History:        Patient has no prior history of Echocardiogram examinations.                  Aortic Valve Disease, Mitral Valve Disease, Pulmonic Valve                 Disease and Endocarditis; Signs/Symptoms:Bacteremia.  Sonographer:    Lucendia Herrlich RCS Referring Phys: [id number] Jeanella Craze PROCEDURE: After discussion of the risks and benefits of a TEE, an informed consent was obtained from the patient. The transesophogeal probe was passed without difficulty through the esophogus of the patient. Sedation performed by different physician. The patient's vital signs; including heart rate, blood pressure, and oxygen saturation; remained stable throughout the procedure. The patient developed no complications during the procedure.  IMPRESSIONS  1. Left ventricular ejection fraction, by estimation, is 30 to 35%. The left ventricle has moderately decreased function. The left ventricle has no regional wall motion abnormalities.  2. Right ventricular systolic function is normal. The right ventricular size is normal.  3. No left atrial/left atrial appendage thrombus was detected.  4. Large mass (2.00 cm x 0.88 cm ) on the posterior mitral leaflet which appears to be extending in the mitral annulus . Will benefit from cardiac CT morphology. The mitral valve is abnormal. Mild to moderate mitral valve regurgitation. No evidence of mitral stenosis.  5. There are two mobile masses visible on the anterior leaflet of the tricupid valve which also appears to have a perforation. Another mobile mass ( 0.68 cm x 0.67 cm) on the posterior tricupid leaflet. Moderate Tricuspid regurgitation which appears to be through the leaflet perforation . The tricuspid valve is abnormal.  6. The aortic valve is abnormal. Aortic valve regurgitation is not visualized. No aortic stenosis is present.  7. The inferior vena cava is normal in size with greater than 50% respiratory variability, suggesting right atrial pressure of 3 mmHg. Conclusion(s)/Recommendation(s): Findings

## 2023-07-15 NOTE — Consult Note (Signed)
Result Date: 07/14/2023 See surgical note for result.  DG Elbow 2 Views Right  Result Date: 07/12/2023 CLINICAL DATA:  Sepsis EXAM: RIGHT ELBOW - 2 VIEW COMPARISON:  None Available. FINDINGS: Slightly limited evaluation secondary to obliquity on the lateral view limiting assessment for an underlying joint effusion. Within this limitation, there is no evidence of a large elbow joint effusion. No fracture or dislocation. Joint spaces are maintained. Extensive  vascular calcifications, severely advanced for age. No focal soft tissue swelling. IMPRESSION: 1. No acute osseous abnormality of the right elbow. 2. Extensive vascular calcifications, severely advanced for age. Electronically Signed   By: Duanne Guess D.O.   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 There is no acute fracture or dislocation. No cortical erosions are identified. There is soft tissue swelling of the midfoot. Peripheral vascular calcifications are again noted. IMPRESSION: 1. No acute fracture or dislocation. 2. 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 Electronically Signed   By: Darliss Cheney M.D.   On: 07/12/2023 19:25   ECHOCARDIOGRAM COMPLETE  Result Date: 07/11/2023    ECHOCARDIOGRAM REPORT   Patient Name:   [name] Date of Exam: 07/11/2023 Medical Rec #:  [id number]     Height:       67.0 in Accession #:    [id number]    Weight:       169.8 lb Date of Birth:  12/04/1980    BSA:          1.886 m Patient Age:    41 years      BP:           98/72 mmHg Patient Gender: M             HR:           72 bpm. Exam Location:  Inpatient Procedure: 2D Echo, Color Doppler, Cardiac Doppler and Intracardiac            Opacification Agent Indications:    Bacteremia R78.81  History:        Patient has prior history of Echocardiogram examinations, most                 recent 04/26/2019. Risk Factors:Hypertension, Diabetes and ESRD.  Sonographer:    Irving Burton Senior RDCS Referring Phys: Encarnacion Chu COMER  Sonographer Comments: Technically difficult due to poor echo windows IMPRESSIONS  1. Left ventricular ejection fraction, by estimation, is 25 to 30%. Left ventricular ejection fraction by PLAX is 30 %. The left ventricle has severely  decreased function. The left ventricle demonstrates global hypokinesis. Left ventricular diastolic parameters are consistent with Grade I diastolic dysfunction (impaired relaxation).  2. Right ventricular systolic function is moderately reduced. The right ventricular size is normal. There is normal pulmonary artery systolic pressure.  3. Possible filamentous structure on the ventricular side of the mitral valve (image 54). The mitral valve is abnormal. Trivial mitral valve regurgitation.  4. Aortic Valve leaflets and annulus appear thickened, would recommend TEE to assess for endocarditis in the setting of bacteremia. The aortic valve is abnormal. Aortic valve regurgitation is not visualized.  5. The inferior vena cava is normal in size with <50% respiratory variability, suggesting right atrial pressure of 8 mmHg. Conclusion(s)/Recommendation(s):  Result Date: 07/14/2023 See surgical note for result.  DG Elbow 2 Views Right  Result Date: 07/12/2023 CLINICAL DATA:  Sepsis EXAM: RIGHT ELBOW - 2 VIEW COMPARISON:  None Available. FINDINGS: Slightly limited evaluation secondary to obliquity on the lateral view limiting assessment for an underlying joint effusion. Within this limitation, there is no evidence of a large elbow joint effusion. No fracture or dislocation. Joint spaces are maintained. Extensive  vascular calcifications, severely advanced for age. No focal soft tissue swelling. IMPRESSION: 1. No acute osseous abnormality of the right elbow. 2. Extensive vascular calcifications, severely advanced for age. Electronically Signed   By: Duanne Guess D.O.   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 There is no acute fracture or dislocation. No cortical erosions are identified. There is soft tissue swelling of the midfoot. Peripheral vascular calcifications are again noted. IMPRESSION: 1. No acute fracture or dislocation. 2. 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 Electronically Signed   By: Darliss Cheney M.D.   On: 07/12/2023 19:25   ECHOCARDIOGRAM COMPLETE  Result Date: 07/11/2023    ECHOCARDIOGRAM REPORT   Patient Name:   [name] Date of Exam: 07/11/2023 Medical Rec #:  [id number]     Height:       67.0 in Accession #:    [id number]    Weight:       169.8 lb Date of Birth:  12/04/1980    BSA:          1.886 m Patient Age:    41 years      BP:           98/72 mmHg Patient Gender: M             HR:           72 bpm. Exam Location:  Inpatient Procedure: 2D Echo, Color Doppler, Cardiac Doppler and Intracardiac            Opacification Agent Indications:    Bacteremia R78.81  History:        Patient has prior history of Echocardiogram examinations, most                 recent 04/26/2019. Risk Factors:Hypertension, Diabetes and ESRD.  Sonographer:    Irving Burton Senior RDCS Referring Phys: Encarnacion Chu COMER  Sonographer Comments: Technically difficult due to poor echo windows IMPRESSIONS  1. Left ventricular ejection fraction, by estimation, is 25 to 30%. Left ventricular ejection fraction by PLAX is 30 %. The left ventricle has severely  decreased function. The left ventricle demonstrates global hypokinesis. Left ventricular diastolic parameters are consistent with Grade I diastolic dysfunction (impaired relaxation).  2. Right ventricular systolic function is moderately reduced. The right ventricular size is normal. There is normal pulmonary artery systolic pressure.  3. Possible filamentous structure on the ventricular side of the mitral valve (image 54). The mitral valve is abnormal. Trivial mitral valve regurgitation.  4. Aortic Valve leaflets and annulus appear thickened, would recommend TEE to assess for endocarditis in the setting of bacteremia. The aortic valve is abnormal. Aortic valve regurgitation is not visualized.  5. The inferior vena cava is normal in size with <50% respiratory variability, suggesting right atrial pressure of 8 mmHg. Conclusion(s)/Recommendation(s):  Result Date: 07/14/2023 See surgical note for result.  DG Elbow 2 Views Right  Result Date: 07/12/2023 CLINICAL DATA:  Sepsis EXAM: RIGHT ELBOW - 2 VIEW COMPARISON:  None Available. FINDINGS: Slightly limited evaluation secondary to obliquity on the lateral view limiting assessment for an underlying joint effusion. Within this limitation, there is no evidence of a large elbow joint effusion. No fracture or dislocation. Joint spaces are maintained. Extensive  vascular calcifications, severely advanced for age. No focal soft tissue swelling. IMPRESSION: 1. No acute osseous abnormality of the right elbow. 2. Extensive vascular calcifications, severely advanced for age. Electronically Signed   By: Duanne Guess D.O.   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 There is no acute fracture or dislocation. No cortical erosions are identified. There is soft tissue swelling of the midfoot. Peripheral vascular calcifications are again noted. IMPRESSION: 1. No acute fracture or dislocation. 2. 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 Electronically Signed   By: Darliss Cheney M.D.   On: 07/12/2023 19:25   ECHOCARDIOGRAM COMPLETE  Result Date: 07/11/2023    ECHOCARDIOGRAM REPORT   Patient Name:   [name] Date of Exam: 07/11/2023 Medical Rec #:  [id number]     Height:       67.0 in Accession #:    [id number]    Weight:       169.8 lb Date of Birth:  12/04/1980    BSA:          1.886 m Patient Age:    41 years      BP:           98/72 mmHg Patient Gender: M             HR:           72 bpm. Exam Location:  Inpatient Procedure: 2D Echo, Color Doppler, Cardiac Doppler and Intracardiac            Opacification Agent Indications:    Bacteremia R78.81  History:        Patient has prior history of Echocardiogram examinations, most                 recent 04/26/2019. Risk Factors:Hypertension, Diabetes and ESRD.  Sonographer:    Irving Burton Senior RDCS Referring Phys: Encarnacion Chu COMER  Sonographer Comments: Technically difficult due to poor echo windows IMPRESSIONS  1. Left ventricular ejection fraction, by estimation, is 25 to 30%. Left ventricular ejection fraction by PLAX is 30 %. The left ventricle has severely  decreased function. The left ventricle demonstrates global hypokinesis. Left ventricular diastolic parameters are consistent with Grade I diastolic dysfunction (impaired relaxation).  2. Right ventricular systolic function is moderately reduced. The right ventricular size is normal. There is normal pulmonary artery systolic pressure.  3. Possible filamentous structure on the ventricular side of the mitral valve (image 54). The mitral valve is abnormal. Trivial mitral valve regurgitation.  4. Aortic Valve leaflets and annulus appear thickened, would recommend TEE to assess for endocarditis in the setting of bacteremia. The aortic valve is abnormal. Aortic valve regurgitation is not visualized.  5. The inferior vena cava is normal in size with <50% respiratory variability, suggesting right atrial pressure of 8 mmHg. Conclusion(s)/Recommendation(s):  junction and cervicothoracic junction, were obtained without intravenous contrast. COMPARISON:  Head and cervical spine CT 08/08/2021. Head CT 07/09/2023 FINDINGS: MRI HEAD FINDINGS Brain: Sizable infarct in the right frontal lobe and insula extending into the parietal lobe, upper division MCA territory. Small acute infarcts seen in the left occipital lobe and bilateral cerebellum. ADC map correlation is limited due to the to the degree of motion artifact. Petechial hemorrhage by gradient imaging and T1 hyperintensity at the level of the confluent right cerebral infarct. No hematoma, hydrocephalus, or shift. Vascular: Grossly preserved flow voids Skull and upper cervical spine: Normal marrow signal Sinuses/Orbits: Opacified left maxillary sinus, new from 2022 and also seen on most recent head CT. MRI CERVICAL SPINE FINDINGS Alignment: Normal Vertebrae: Diffusely hypointense marrow on T1 weighted imaging, likely related to patient's history of end-stage renal disease with anemia. No focal lesion is seen. On STIR imaging no detected marrow edema to imply infection or fracture Cord: Normal shape and volume by sagittal T1 weighted imaging; nondiagnostic assessment on the other sequences. Posterior Fossa, vertebral arteries, paraspinal tissues: Prevertebral edema/fluid without visible source. There is muscular and subcutaneous edema which is generalized and could be from volume overload. Disc levels: No visible herniation or impingement These results will be called to the ordering clinician or representative by the Radiologist Assistant, and communication documented in the PACS or [employer]. IMPRESSION: Brain MRI: 1. 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 Small acute infarcts in the left occipital lobe and bilateral cerebellum. Petechial hemorrhage is present  without hematoma. No worrisome mass effect. 2. Significant motion artifact. Cervical MRI: 1. Marked motion artifact with most images being nondiagnostic. 2. Prevertebral edema/fluid but no visible spinal source or drainable collection. Electronically Signed   By: Tiburcio Pea M.D.   On: 07/15/2023 12:35   ECHO TEE  Result Date: 07/14/2023    TRANSESOPHOGEAL ECHO REPORT   Patient Name:   [name] Date of Exam: 07/14/2023 Medical Rec #:  [id number]     Height:       67.0 in Accession #:    [id number]    Weight:       166.9 lb Date of Birth:  05-20-1981    BSA:          1.873 m Patient Age:    41 years      BP:           127/60 mmHg Patient Gender: M             HR:           92 bpm. Exam Location:  Inpatient Procedure: Transesophageal Echo, 3D Echo, Cardiac Doppler and Color Doppler Indications:    Bacteremia  History:        Patient has no prior history of Echocardiogram examinations.                 Aortic Valve Disease, Mitral Valve Disease, Pulmonic Valve                 Disease and Endocarditis; Signs/Symptoms:Bacteremia.  Sonographer:    Lucendia Herrlich RCS Referring Phys: [id number] Jeanella Craze PROCEDURE: After discussion of the risks and benefits of a TEE, an informed consent was obtained from the patient. The transesophogeal probe was passed without difficulty through the esophogus of the patient. Sedation performed by different physician. The patient's vital signs; including heart rate, blood pressure, and oxygen saturation; remained stable throughout the procedure. The patient developed no complications during  Result Date: 07/14/2023 See surgical note for result.  DG Elbow 2 Views Right  Result Date: 07/12/2023 CLINICAL DATA:  Sepsis EXAM: RIGHT ELBOW - 2 VIEW COMPARISON:  None Available. FINDINGS: Slightly limited evaluation secondary to obliquity on the lateral view limiting assessment for an underlying joint effusion. Within this limitation, there is no evidence of a large elbow joint effusion. No fracture or dislocation. Joint spaces are maintained. Extensive  vascular calcifications, severely advanced for age. No focal soft tissue swelling. IMPRESSION: 1. No acute osseous abnormality of the right elbow. 2. Extensive vascular calcifications, severely advanced for age. Electronically Signed   By: Duanne Guess D.O.   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 There is no acute fracture or dislocation. No cortical erosions are identified. There is soft tissue swelling of the midfoot. Peripheral vascular calcifications are again noted. IMPRESSION: 1. No acute fracture or dislocation. 2. 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 Electronically Signed   By: Darliss Cheney M.D.   On: 07/12/2023 19:25   ECHOCARDIOGRAM COMPLETE  Result Date: 07/11/2023    ECHOCARDIOGRAM REPORT   Patient Name:   [name] Date of Exam: 07/11/2023 Medical Rec #:  [id number]     Height:       67.0 in Accession #:    [id number]    Weight:       169.8 lb Date of Birth:  12/04/1980    BSA:          1.886 m Patient Age:    41 years      BP:           98/72 mmHg Patient Gender: M             HR:           72 bpm. Exam Location:  Inpatient Procedure: 2D Echo, Color Doppler, Cardiac Doppler and Intracardiac            Opacification Agent Indications:    Bacteremia R78.81  History:        Patient has prior history of Echocardiogram examinations, most                 recent 04/26/2019. Risk Factors:Hypertension, Diabetes and ESRD.  Sonographer:    Irving Burton Senior RDCS Referring Phys: Encarnacion Chu COMER  Sonographer Comments: Technically difficult due to poor echo windows IMPRESSIONS  1. Left ventricular ejection fraction, by estimation, is 25 to 30%. Left ventricular ejection fraction by PLAX is 30 %. The left ventricle has severely  decreased function. The left ventricle demonstrates global hypokinesis. Left ventricular diastolic parameters are consistent with Grade I diastolic dysfunction (impaired relaxation).  2. Right ventricular systolic function is moderately reduced. The right ventricular size is normal. There is normal pulmonary artery systolic pressure.  3. Possible filamentous structure on the ventricular side of the mitral valve (image 54). The mitral valve is abnormal. Trivial mitral valve regurgitation.  4. Aortic Valve leaflets and annulus appear thickened, would recommend TEE to assess for endocarditis in the setting of bacteremia. The aortic valve is abnormal. Aortic valve regurgitation is not visualized.  5. The inferior vena cava is normal in size with <50% respiratory variability, suggesting right atrial pressure of 8 mmHg. Conclusion(s)/Recommendation(s):  junction and cervicothoracic junction, were obtained without intravenous contrast. COMPARISON:  Head and cervical spine CT 08/08/2021. Head CT 07/09/2023 FINDINGS: MRI HEAD FINDINGS Brain: Sizable infarct in the right frontal lobe and insula extending into the parietal lobe, upper division MCA territory. Small acute infarcts seen in the left occipital lobe and bilateral cerebellum. ADC map correlation is limited due to the to the degree of motion artifact. Petechial hemorrhage by gradient imaging and T1 hyperintensity at the level of the confluent right cerebral infarct. No hematoma, hydrocephalus, or shift. Vascular: Grossly preserved flow voids Skull and upper cervical spine: Normal marrow signal Sinuses/Orbits: Opacified left maxillary sinus, new from 2022 and also seen on most recent head CT. MRI CERVICAL SPINE FINDINGS Alignment: Normal Vertebrae: Diffusely hypointense marrow on T1 weighted imaging, likely related to patient's history of end-stage renal disease with anemia. No focal lesion is seen. On STIR imaging no detected marrow edema to imply infection or fracture Cord: Normal shape and volume by sagittal T1 weighted imaging; nondiagnostic assessment on the other sequences. Posterior Fossa, vertebral arteries, paraspinal tissues: Prevertebral edema/fluid without visible source. There is muscular and subcutaneous edema which is generalized and could be from volume overload. Disc levels: No visible herniation or impingement These results will be called to the ordering clinician or representative by the Radiologist Assistant, and communication documented in the PACS or [employer]. IMPRESSION: Brain MRI: 1. 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 Small acute infarcts in the left occipital lobe and bilateral cerebellum. Petechial hemorrhage is present  without hematoma. No worrisome mass effect. 2. Significant motion artifact. Cervical MRI: 1. Marked motion artifact with most images being nondiagnostic. 2. Prevertebral edema/fluid but no visible spinal source or drainable collection. Electronically Signed   By: Tiburcio Pea M.D.   On: 07/15/2023 12:35   ECHO TEE  Result Date: 07/14/2023    TRANSESOPHOGEAL ECHO REPORT   Patient Name:   [name] Date of Exam: 07/14/2023 Medical Rec #:  [id number]     Height:       67.0 in Accession #:    [id number]    Weight:       166.9 lb Date of Birth:  05-20-1981    BSA:          1.873 m Patient Age:    41 years      BP:           127/60 mmHg Patient Gender: M             HR:           92 bpm. Exam Location:  Inpatient Procedure: Transesophageal Echo, 3D Echo, Cardiac Doppler and Color Doppler Indications:    Bacteremia  History:        Patient has no prior history of Echocardiogram examinations.                 Aortic Valve Disease, Mitral Valve Disease, Pulmonic Valve                 Disease and Endocarditis; Signs/Symptoms:Bacteremia.  Sonographer:    Lucendia Herrlich RCS Referring Phys: [id number] Jeanella Craze PROCEDURE: After discussion of the risks and benefits of a TEE, an informed consent was obtained from the patient. The transesophogeal probe was passed without difficulty through the esophogus of the patient. Sedation performed by different physician. The patient's vital signs; including heart rate, blood pressure, and oxygen saturation; remained stable throughout the procedure. The patient developed no complications during  Result Date: 07/14/2023 See surgical note for result.  DG Elbow 2 Views Right  Result Date: 07/12/2023 CLINICAL DATA:  Sepsis EXAM: RIGHT ELBOW - 2 VIEW COMPARISON:  None Available. FINDINGS: Slightly limited evaluation secondary to obliquity on the lateral view limiting assessment for an underlying joint effusion. Within this limitation, there is no evidence of a large elbow joint effusion. No fracture or dislocation. Joint spaces are maintained. Extensive  vascular calcifications, severely advanced for age. No focal soft tissue swelling. IMPRESSION: 1. No acute osseous abnormality of the right elbow. 2. Extensive vascular calcifications, severely advanced for age. Electronically Signed   By: Duanne Guess D.O.   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 There is no acute fracture or dislocation. No cortical erosions are identified. There is soft tissue swelling of the midfoot. Peripheral vascular calcifications are again noted. IMPRESSION: 1. No acute fracture or dislocation. 2. 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 Electronically Signed   By: Darliss Cheney M.D.   On: 07/12/2023 19:25   ECHOCARDIOGRAM COMPLETE  Result Date: 07/11/2023    ECHOCARDIOGRAM REPORT   Patient Name:   [name] Date of Exam: 07/11/2023 Medical Rec #:  [id number]     Height:       67.0 in Accession #:    [id number]    Weight:       169.8 lb Date of Birth:  12/04/1980    BSA:          1.886 m Patient Age:    41 years      BP:           98/72 mmHg Patient Gender: M             HR:           72 bpm. Exam Location:  Inpatient Procedure: 2D Echo, Color Doppler, Cardiac Doppler and Intracardiac            Opacification Agent Indications:    Bacteremia R78.81  History:        Patient has prior history of Echocardiogram examinations, most                 recent 04/26/2019. Risk Factors:Hypertension, Diabetes and ESRD.  Sonographer:    Irving Burton Senior RDCS Referring Phys: Encarnacion Chu COMER  Sonographer Comments: Technically difficult due to poor echo windows IMPRESSIONS  1. Left ventricular ejection fraction, by estimation, is 25 to 30%. Left ventricular ejection fraction by PLAX is 30 %. The left ventricle has severely  decreased function. The left ventricle demonstrates global hypokinesis. Left ventricular diastolic parameters are consistent with Grade I diastolic dysfunction (impaired relaxation).  2. Right ventricular systolic function is moderately reduced. The right ventricular size is normal. There is normal pulmonary artery systolic pressure.  3. Possible filamentous structure on the ventricular side of the mitral valve (image 54). The mitral valve is abnormal. Trivial mitral valve regurgitation.  4. Aortic Valve leaflets and annulus appear thickened, would recommend TEE to assess for endocarditis in the setting of bacteremia. The aortic valve is abnormal. Aortic valve regurgitation is not visualized.  5. The inferior vena cava is normal in size with <50% respiratory variability, suggesting right atrial pressure of 8 mmHg. Conclusion(s)/Recommendation(s):  Result Date: 07/14/2023 See surgical note for result.  DG Elbow 2 Views Right  Result Date: 07/12/2023 CLINICAL DATA:  Sepsis EXAM: RIGHT ELBOW - 2 VIEW COMPARISON:  None Available. FINDINGS: Slightly limited evaluation secondary to obliquity on the lateral view limiting assessment for an underlying joint effusion. Within this limitation, there is no evidence of a large elbow joint effusion. No fracture or dislocation. Joint spaces are maintained. Extensive  vascular calcifications, severely advanced for age. No focal soft tissue swelling. IMPRESSION: 1. No acute osseous abnormality of the right elbow. 2. Extensive vascular calcifications, severely advanced for age. Electronically Signed   By: Duanne Guess D.O.   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 There is no acute fracture or dislocation. No cortical erosions are identified. There is soft tissue swelling of the midfoot. Peripheral vascular calcifications are again noted. IMPRESSION: 1. No acute fracture or dislocation. 2. 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 Electronically Signed   By: Darliss Cheney M.D.   On: 07/12/2023 19:25   ECHOCARDIOGRAM COMPLETE  Result Date: 07/11/2023    ECHOCARDIOGRAM REPORT   Patient Name:   [name] Date of Exam: 07/11/2023 Medical Rec #:  [id number]     Height:       67.0 in Accession #:    [id number]    Weight:       169.8 lb Date of Birth:  12/04/1980    BSA:          1.886 m Patient Age:    41 years      BP:           98/72 mmHg Patient Gender: M             HR:           72 bpm. Exam Location:  Inpatient Procedure: 2D Echo, Color Doppler, Cardiac Doppler and Intracardiac            Opacification Agent Indications:    Bacteremia R78.81  History:        Patient has prior history of Echocardiogram examinations, most                 recent 04/26/2019. Risk Factors:Hypertension, Diabetes and ESRD.  Sonographer:    Irving Burton Senior RDCS Referring Phys: Encarnacion Chu COMER  Sonographer Comments: Technically difficult due to poor echo windows IMPRESSIONS  1. Left ventricular ejection fraction, by estimation, is 25 to 30%. Left ventricular ejection fraction by PLAX is 30 %. The left ventricle has severely  decreased function. The left ventricle demonstrates global hypokinesis. Left ventricular diastolic parameters are consistent with Grade I diastolic dysfunction (impaired relaxation).  2. Right ventricular systolic function is moderately reduced. The right ventricular size is normal. There is normal pulmonary artery systolic pressure.  3. Possible filamentous structure on the ventricular side of the mitral valve (image 54). The mitral valve is abnormal. Trivial mitral valve regurgitation.  4. Aortic Valve leaflets and annulus appear thickened, would recommend TEE to assess for endocarditis in the setting of bacteremia. The aortic valve is abnormal. Aortic valve regurgitation is not visualized.  5. The inferior vena cava is normal in size with <50% respiratory variability, suggesting right atrial pressure of 8 mmHg. Conclusion(s)/Recommendation(s):  junction and cervicothoracic junction, were obtained without intravenous contrast. COMPARISON:  Head and cervical spine CT 08/08/2021. Head CT 07/09/2023 FINDINGS: MRI HEAD FINDINGS Brain: Sizable infarct in the right frontal lobe and insula extending into the parietal lobe, upper division MCA territory. Small acute infarcts seen in the left occipital lobe and bilateral cerebellum. ADC map correlation is limited due to the to the degree of motion artifact. Petechial hemorrhage by gradient imaging and T1 hyperintensity at the level of the confluent right cerebral infarct. No hematoma, hydrocephalus, or shift. Vascular: Grossly preserved flow voids Skull and upper cervical spine: Normal marrow signal Sinuses/Orbits: Opacified left maxillary sinus, new from 2022 and also seen on most recent head CT. MRI CERVICAL SPINE FINDINGS Alignment: Normal Vertebrae: Diffusely hypointense marrow on T1 weighted imaging, likely related to patient's history of end-stage renal disease with anemia. No focal lesion is seen. On STIR imaging no detected marrow edema to imply infection or fracture Cord: Normal shape and volume by sagittal T1 weighted imaging; nondiagnostic assessment on the other sequences. Posterior Fossa, vertebral arteries, paraspinal tissues: Prevertebral edema/fluid without visible source. There is muscular and subcutaneous edema which is generalized and could be from volume overload. Disc levels: No visible herniation or impingement These results will be called to the ordering clinician or representative by the Radiologist Assistant, and communication documented in the PACS or [employer]. IMPRESSION: Brain MRI: 1. 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 Small acute infarcts in the left occipital lobe and bilateral cerebellum. Petechial hemorrhage is present  without hematoma. No worrisome mass effect. 2. Significant motion artifact. Cervical MRI: 1. Marked motion artifact with most images being nondiagnostic. 2. Prevertebral edema/fluid but no visible spinal source or drainable collection. Electronically Signed   By: Tiburcio Pea M.D.   On: 07/15/2023 12:35   ECHO TEE  Result Date: 07/14/2023    TRANSESOPHOGEAL ECHO REPORT   Patient Name:   [name] Date of Exam: 07/14/2023 Medical Rec #:  [id number]     Height:       67.0 in Accession #:    [id number]    Weight:       166.9 lb Date of Birth:  05-20-1981    BSA:          1.873 m Patient Age:    41 years      BP:           127/60 mmHg Patient Gender: M             HR:           92 bpm. Exam Location:  Inpatient Procedure: Transesophageal Echo, 3D Echo, Cardiac Doppler and Color Doppler Indications:    Bacteremia  History:        Patient has no prior history of Echocardiogram examinations.                 Aortic Valve Disease, Mitral Valve Disease, Pulmonic Valve                 Disease and Endocarditis; Signs/Symptoms:Bacteremia.  Sonographer:    Lucendia Herrlich RCS Referring Phys: [id number] Jeanella Craze PROCEDURE: After discussion of the risks and benefits of a TEE, an informed consent was obtained from the patient. The transesophogeal probe was passed without difficulty through the esophogus of the patient. Sedation performed by different physician. The patient's vital signs; including heart rate, blood pressure, and oxygen saturation; remained stable throughout the procedure. The patient developed no complications during  junction and cervicothoracic junction, were obtained without intravenous contrast. COMPARISON:  Head and cervical spine CT 08/08/2021. Head CT 07/09/2023 FINDINGS: MRI HEAD FINDINGS Brain: Sizable infarct in the right frontal lobe and insula extending into the parietal lobe, upper division MCA territory. Small acute infarcts seen in the left occipital lobe and bilateral cerebellum. ADC map correlation is limited due to the to the degree of motion artifact. Petechial hemorrhage by gradient imaging and T1 hyperintensity at the level of the confluent right cerebral infarct. No hematoma, hydrocephalus, or shift. Vascular: Grossly preserved flow voids Skull and upper cervical spine: Normal marrow signal Sinuses/Orbits: Opacified left maxillary sinus, new from 2022 and also seen on most recent head CT. MRI CERVICAL SPINE FINDINGS Alignment: Normal Vertebrae: Diffusely hypointense marrow on T1 weighted imaging, likely related to patient's history of end-stage renal disease with anemia. No focal lesion is seen. On STIR imaging no detected marrow edema to imply infection or fracture Cord: Normal shape and volume by sagittal T1 weighted imaging; nondiagnostic assessment on the other sequences. Posterior Fossa, vertebral arteries, paraspinal tissues: Prevertebral edema/fluid without visible source. There is muscular and subcutaneous edema which is generalized and could be from volume overload. Disc levels: No visible herniation or impingement These results will be called to the ordering clinician or representative by the Radiologist Assistant, and communication documented in the PACS or [employer]. IMPRESSION: Brain MRI: 1. 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 Small acute infarcts in the left occipital lobe and bilateral cerebellum. Petechial hemorrhage is present  without hematoma. No worrisome mass effect. 2. Significant motion artifact. Cervical MRI: 1. Marked motion artifact with most images being nondiagnostic. 2. Prevertebral edema/fluid but no visible spinal source or drainable collection. Electronically Signed   By: Tiburcio Pea M.D.   On: 07/15/2023 12:35   ECHO TEE  Result Date: 07/14/2023    TRANSESOPHOGEAL ECHO REPORT   Patient Name:   [name] Date of Exam: 07/14/2023 Medical Rec #:  [id number]     Height:       67.0 in Accession #:    [id number]    Weight:       166.9 lb Date of Birth:  05-20-1981    BSA:          1.873 m Patient Age:    41 years      BP:           127/60 mmHg Patient Gender: M             HR:           92 bpm. Exam Location:  Inpatient Procedure: Transesophageal Echo, 3D Echo, Cardiac Doppler and Color Doppler Indications:    Bacteremia  History:        Patient has no prior history of Echocardiogram examinations.                 Aortic Valve Disease, Mitral Valve Disease, Pulmonic Valve                 Disease and Endocarditis; Signs/Symptoms:Bacteremia.  Sonographer:    Lucendia Herrlich RCS Referring Phys: [id number] Jeanella Craze PROCEDURE: After discussion of the risks and benefits of a TEE, an informed consent was obtained from the patient. The transesophogeal probe was passed without difficulty through the esophogus of the patient. Sedation performed by different physician. The patient's vital signs; including heart rate, blood pressure, and oxygen saturation; remained stable throughout the procedure. The patient developed no complications during  Result Date: 07/14/2023 See surgical note for result.  DG Elbow 2 Views Right  Result Date: 07/12/2023 CLINICAL DATA:  Sepsis EXAM: RIGHT ELBOW - 2 VIEW COMPARISON:  None Available. FINDINGS: Slightly limited evaluation secondary to obliquity on the lateral view limiting assessment for an underlying joint effusion. Within this limitation, there is no evidence of a large elbow joint effusion. No fracture or dislocation. Joint spaces are maintained. Extensive  vascular calcifications, severely advanced for age. No focal soft tissue swelling. IMPRESSION: 1. No acute osseous abnormality of the right elbow. 2. Extensive vascular calcifications, severely advanced for age. Electronically Signed   By: Duanne Guess D.O.   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 There is no acute fracture or dislocation. No cortical erosions are identified. There is soft tissue swelling of the midfoot. Peripheral vascular calcifications are again noted. IMPRESSION: 1. No acute fracture or dislocation. 2. 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 Electronically Signed   By: Darliss Cheney M.D.   On: 07/12/2023 19:25   ECHOCARDIOGRAM COMPLETE  Result Date: 07/11/2023    ECHOCARDIOGRAM REPORT   Patient Name:   [name] Date of Exam: 07/11/2023 Medical Rec #:  [id number]     Height:       67.0 in Accession #:    [id number]    Weight:       169.8 lb Date of Birth:  12/04/1980    BSA:          1.886 m Patient Age:    41 years      BP:           98/72 mmHg Patient Gender: M             HR:           72 bpm. Exam Location:  Inpatient Procedure: 2D Echo, Color Doppler, Cardiac Doppler and Intracardiac            Opacification Agent Indications:    Bacteremia R78.81  History:        Patient has prior history of Echocardiogram examinations, most                 recent 04/26/2019. Risk Factors:Hypertension, Diabetes and ESRD.  Sonographer:    Irving Burton Senior RDCS Referring Phys: Encarnacion Chu COMER  Sonographer Comments: Technically difficult due to poor echo windows IMPRESSIONS  1. Left ventricular ejection fraction, by estimation, is 25 to 30%. Left ventricular ejection fraction by PLAX is 30 %. The left ventricle has severely  decreased function. The left ventricle demonstrates global hypokinesis. Left ventricular diastolic parameters are consistent with Grade I diastolic dysfunction (impaired relaxation).  2. Right ventricular systolic function is moderately reduced. The right ventricular size is normal. There is normal pulmonary artery systolic pressure.  3. Possible filamentous structure on the ventricular side of the mitral valve (image 54). The mitral valve is abnormal. Trivial mitral valve regurgitation.  4. Aortic Valve leaflets and annulus appear thickened, would recommend TEE to assess for endocarditis in the setting of bacteremia. The aortic valve is abnormal. Aortic valve regurgitation is not visualized.  5. The inferior vena cava is normal in size with <50% respiratory variability, suggesting right atrial pressure of 8 mmHg. Conclusion(s)/Recommendation(s):

## 2023-07-15 NOTE — Inpatient Diabetes Management (Signed)
Inpatient Diabetes Program Recommendations  AACE/ADA: New Consensus Statement on Inpatient Glycemic Control (2015)  Target Ranges:  Prepandial:   less than 140 mg/dL      Peak postprandial:   less than 180 mg/dL (1-2 hours)      Critically ill patients:  140 - 180 mg/dL   Lab Results  Component Value Date   GLUCAP 345 (H) 07/15/2023   HGBA1C 8.9 (H) 07/10/2023    Review of Glycemic Control  Latest Reference Range & Units 07/14/23 07:49 07/14/23 12:08 07/15/23 00:38 07/15/23 07:58  Glucose-Capillary 70 - 99 mg/dL 161 (H) 096 (H) 045 (H) 345 (H)   Diabetes history: Type 1 DM Outpatient Diabetes medications: Novolin R 12 units TID, Novolin 70/30 10 units BID Current orders for Inpatient glycemic control:  Novolog 0-6 units TID Semglee 5 units Daily  Glucerna tid between meals  Inpatient Diabetes Program Recommendations:    MD note: all insulin held yesterday, even basal insulin, due to pt not being on unit and being NPO. Pt still should have received basal insulin and correction scale orders are to be given even if NPO. Glucose well into the 300 range today.   No changes for today. Pt to get normal scheduled insulin today.  Thanks,  Christena Deem RN, MSN, BC-ADM Inpatient Diabetes Coordinator Team Pager [phone] (8a-5p)

## 2023-07-15 NOTE — Progress Notes (Addendum)
Subjective:  He is unhappy about receiving oral antinausea medicines   Antibiotics:  Anti-infectives (From admission, onward)    Start     Dose/Rate Route Frequency Ordered Stop   07/16/23 0000  ceFAZolin (ANCEF) IVPB 2g/100 mL premix        2 g 200 mL/hr over 30 Minutes Intravenous To Radiology 07/15/23 1408 07/17/23 0000   07/14/23 1200  vancomycin (VANCOREADY) IVPB 750 mg/150 mL        750 mg 150 mL/hr over 60 Minutes Intravenous Every M-W-F (Hemodialysis) 07/12/23 1203     07/11/23 2330  vancomycin (VANCOREADY) IVPB 750 mg/150 mL        750 mg 150 mL/hr over 60 Minutes Intravenous  Once 07/11/23 2030 07/12/23 0110   07/11/23 1800  vancomycin (VANCOREADY) IVPB 750 mg/150 mL  Status:  Discontinued        750 mg 150 mL/hr over 60 Minutes Intravenous  Once 07/11/23 0856 07/11/23 1810   07/10/23 2318  vancomycin variable dose per unstable renal function (pharmacist dosing)  Status:  Discontinued         Does not apply See admin instructions 07/10/23 2319 07/12/23 1203   07/10/23 1429  vancomycin (VANCOREADY) IVPB 750 mg/150 mL  Status:  Discontinued        750 mg 150 mL/hr over 60 Minutes Intravenous Every Dialysis 07/10/23 1429 07/11/23 2030   07/10/23 1230  fidaxomicin (DIFICID) tablet 200 mg  Status:  Discontinued        200 mg Oral 2 times daily 07/10/23 1139 07/10/23 1207   07/10/23 1000  piperacillin-tazobactam (ZOSYN) IVPB 2.25 g  Status:  Discontinued        2.25 g 100 mL/hr over 30 Minutes Intravenous Every 8 hours 07/10/23 0908 07/10/23 1350   07/09/23 2330  vancomycin (VANCOREADY) IVPB 1750 mg/350 mL        1,750 mg 175 mL/hr over 120 Minutes Intravenous  Once 07/09/23 2329 07/10/23 0734   07/09/23 2330  piperacillin-tazobactam (ZOSYN) IVPB 3.375 g        3.375 g 100 mL/hr over 30 Minutes Intravenous  Once 07/09/23 2329 07/10/23 0240       Medications: Scheduled Meds:  calcitRIOL  1.5 mcg Oral Q M,W,F-HD   Chlorhexidine Gluconate Cloth  6 each  Topical Q0600   cinacalcet  120 mg Oral Q supper   [START ON 07/16/2023] darbepoetin (ARANESP) injection - DIALYSIS  40 mcg Subcutaneous Q Fri-1800   famotidine  10 mg Oral Daily   feeding supplement (GLUCERNA SHAKE)  237 mL Oral TID BM   gabapentin  100 mg Oral TID   heparin  5,000 Units Subcutaneous Q8H   insulin aspart  0-6 Units Subcutaneous TID WC   [START ON 07/16/2023] insulin glargine-yfgn  10 Units Subcutaneous Daily   multivitamin with minerals  1 tablet Oral QHS   sevelamer carbonate  2,400 mg Oral TID WC   Continuous Infusions:  [START ON 07/16/2023]  ceFAZolin (ANCEF) IV     vancomycin Stopped (07/14/23 1842)   PRN Meds:.acetaminophen, dextrose, docusate sodium, heparin sodium (porcine), mouth rinse, polyethylene glycol, promethazine    Objective: Weight change:   Intake/Output Summary (Last 24 hours) at 07/15/2023 1424 Last data filed at 07/15/2023 1030 Gross per 24 hour  Intake 120 ml  Output 1200 ml  Net -1080 ml   Blood pressure (!) 97/54, pulse 91, temperature 97.7 F (36.5 C), temperature source Oral, resp. rate 18, weight 74.5 kg, SpO2 96%.  Temp:  [96.6 F (35.9 C)-98.3 F (36.8 C)] 97.7 F (36.5 C) (10/17 0800) Pulse Rate:  [88-96] 91 (10/17 0800) Resp:  [11-20] 18 (10/17 0800) BP: (89-119)/(44-72) 97/54 (10/17 0800) SpO2:  [96 %-100 %] 96 % (10/17 0800) Weight:  [74.5 kg] 74.5 kg (10/17 0500)  Physical Exam: Physical Exam Constitutional:      Appearance: He is well-developed.  HENT:     Head: Normocephalic and atraumatic.  Eyes:     Conjunctiva/sclera: Conjunctivae normal.  Cardiovascular:     Rate and Rhythm: Normal rate and regular rhythm.  Pulmonary:     Effort: Pulmonary effort is normal. No respiratory distress.     Breath sounds: Normal breath sounds. No stridor. No wheezing.  Abdominal:     General: There is no distension.     Palpations: Abdomen is soft.  Musculoskeletal:     Cervical back: Normal range of motion and neck  supple.  Skin:    General: Skin is warm and dry.     Findings: No erythema or rash.  Neurological:     General: No focal deficit present.     Mental Status: He is alert and oriented to person, place, and time.  Psychiatric:        Attention and Perception: He is inattentive.        Mood and Affect: Mood normal.        Speech: Speech is delayed.        Behavior: Behavior normal.        Thought Content: Thought content normal.        Judgment: Judgment normal.        Right elbow area    Right foot    Left amputation site    CBC:    BMET Recent Labs    07/14/23 0910 07/15/23 0609  NA 127* 131*  K 4.3 4.1  CL 92* 89*  CO2 16* 14*  GLUCOSE 191* 332*  BUN 62* 49*  CREATININE 11.70* 8.54*  CALCIUM 8.1* 8.3*     Liver Panel  No results for input(s): "PROT", "ALBUMIN", "AST", "ALT", "ALKPHOS", "BILITOT", "BILIDIR", "IBILI" in the last 72 hours.      Sedimentation Rate No results for input(s): "ESRSEDRATE" in the last 72 hours. C-Reactive Protein No results for input(s): "CRP" in the last 72 hours.  Micro Results: Recent Results (from the past 720 hour(s))  Culture, blood (routine x 2)     Status: Abnormal   Collection Time: 07/10/23 12:15 AM   Specimen: BLOOD  Result Value Ref Range Status   Specimen Description BLOOD BLOOD RIGHT ARM  Final   Special Requests   Final    BOTTLES DRAWN AEROBIC AND ANAEROBIC Blood Culture results may not be optimal due to an excessive volume of blood received in culture bottles   Culture  Setup Time   Final    GRAM POSITIVE COCCI IN CLUSTERS IN BOTH AEROBIC AND ANAEROBIC BOTTLES CRITICAL RESULT CALLED TO, READ BACK BY AND VERIFIED WITH: Ebony Cargo [id number] AT 1234 BY EC Performed at [facility], [address] [place], [name] [address]    Culture METHICILLIN RESISTANT STAPHYLOCOCCUS AUREUS (A)  Final   Report Status 07/12/2023 FINAL  Final   Organism ID, Bacteria METHICILLIN RESISTANT STAPHYLOCOCCUS  AUREUS  Final      Susceptibility   Methicillin resistant staphylococcus aureus - MIC*    CIPROFLOXACIN >=8 RESISTANT Resistant     ERYTHROMYCIN >=8 RESISTANT Resistant  GENTAMICIN <=0.5 SENSITIVE Sensitive     OXACILLIN >=4 RESISTANT Resistant     TETRACYCLINE <=1 SENSITIVE Sensitive     VANCOMYCIN 1 SENSITIVE Sensitive     TRIMETH/SULFA <=10 SENSITIVE Sensitive     CLINDAMYCIN <=0.25 SENSITIVE Sensitive     RIFAMPIN <=0.5 SENSITIVE Sensitive     Inducible Clindamycin NEGATIVE Sensitive     LINEZOLID 2 SENSITIVE Sensitive     * METHICILLIN RESISTANT STAPHYLOCOCCUS AUREUS  Blood Culture ID Panel (Reflexed)     Status: Abnormal   Collection Time: 07/10/23 12:15 AM  Result Value Ref Range Status   Enterococcus faecalis NOT DETECTED NOT DETECTED Final   Enterococcus Faecium NOT DETECTED NOT DETECTED Final   Listeria monocytogenes NOT DETECTED NOT DETECTED Final   Staphylococcus species DETECTED (A) NOT DETECTED Final    Comment: CRITICAL RESULT CALLED TO, READ BACK BY AND VERIFIED WITH: PHARMD JENNY ZHOU [id number] AT 1234 BY EC    Staphylococcus aureus (BCID) DETECTED (A) NOT DETECTED Final    Comment: Methicillin (oxacillin)-resistant Staphylococcus aureus (MRSA). MRSA is predictably resistant to beta-lactam antibiotics (except ceftaroline). Preferred therapy is vancomycin unless clinically contraindicated. Patient requires contact precautions if  hospitalized. CRITICAL RESULT CALLED TO, READ BACK BY AND VERIFIED WITH: PHARMD JENNY ZHOU [phone] AT 1234 BY EC    Staphylococcus epidermidis NOT DETECTED NOT DETECTED Final   Staphylococcus lugdunensis NOT DETECTED NOT DETECTED Final   Streptococcus species NOT DETECTED NOT DETECTED Final   Streptococcus agalactiae NOT DETECTED NOT DETECTED Final   Streptococcus pneumoniae NOT DETECTED NOT DETECTED Final   Streptococcus pyogenes NOT DETECTED NOT DETECTED Final   A.calcoaceticus-baumannii NOT DETECTED NOT DETECTED Final   Bacteroides  fragilis NOT DETECTED NOT DETECTED Final   Enterobacterales NOT DETECTED NOT DETECTED Final   Enterobacter cloacae complex NOT DETECTED NOT DETECTED Final   Escherichia coli NOT DETECTED NOT DETECTED Final   Klebsiella aerogenes NOT DETECTED NOT DETECTED Final   Klebsiella oxytoca NOT DETECTED NOT DETECTED Final   Klebsiella pneumoniae NOT DETECTED NOT DETECTED Final   Proteus species NOT DETECTED NOT DETECTED Final   Salmonella species NOT DETECTED NOT DETECTED Final   Serratia marcescens NOT DETECTED NOT DETECTED Final   Haemophilus influenzae NOT DETECTED NOT DETECTED Final   Neisseria meningitidis NOT DETECTED NOT DETECTED Final   Pseudomonas aeruginosa NOT DETECTED NOT DETECTED Final   Stenotrophomonas maltophilia NOT DETECTED NOT DETECTED Final   Candida albicans NOT DETECTED NOT DETECTED Final   Candida auris NOT DETECTED NOT DETECTED Final   Candida glabrata NOT DETECTED NOT DETECTED Final   Candida krusei NOT DETECTED NOT DETECTED Final   Candida parapsilosis NOT DETECTED NOT DETECTED Final   Candida tropicalis NOT DETECTED NOT DETECTED Final   Cryptococcus neoformans/gattii NOT DETECTED NOT DETECTED Final   Meth resistant mecA/C and MREJ DETECTED (A) NOT DETECTED Final    Comment: CRITICAL RESULT CALLED TO, READ BACK BY AND VERIFIED WITH: Valarie Merino ZHOU [phone] AT 1234 BY EC Performed at [facility], [address], [place], [name] [address]   Culture, blood (routine x 2)     Status: Abnormal   Collection Time: 07/10/23 12:16 AM   Specimen: BLOOD  Result Value Ref Range Status   Specimen Description BLOOD BLOOD RIGHT HAND  Final   Special Requests   Final    BOTTLES DRAWN AEROBIC AND ANAEROBIC Blood Culture results may not be optimal due to an excessive volume of blood received in culture bottles   Culture  Setup Time  Final    GRAM POSITIVE COCCI IN CLUSTERS IN BOTH AEROBIC AND ANAEROBIC BOTTLES CRITICAL VALUE NOTED.  VALUE IS CONSISTENT WITH PREVIOUSLY  REPORTED AND CALLED VALUE.    Culture (A)  Final    STAPHYLOCOCCUS AUREUS SUSCEPTIBILITIES PERFORMED ON PREVIOUS CULTURE WITHIN THE LAST 5 DAYS. Performed at [facility], [address], [place], [name] [address]    Report Status 07/12/2023 FINAL  Final  SARS Coronavirus 2 by RT PCR (hospital order, performed in [facility] hospital lab) *cepheid single result test*     Status: None   Collection Time: 07/10/23 12:17 AM   Specimen: Nasal Swab  Result Value Ref Range Status   SARS Coronavirus 2 by RT PCR NEGATIVE NEGATIVE Final    Comment: Performed at [facility], [address] [place], [name] [address]  MRSA Next Gen by PCR, Nasal     Status: Abnormal   Collection Time: 07/10/23  3:26 AM   Specimen: Nasal Mucosa; Nasal Swab  Result Value Ref Range Status   MRSA by PCR Next Gen DETECTED (A) NOT DETECTED Final    Comment: RESULT CALLED TO, READ BACK BY AND VERIFIED WITH: B CUMMINGS,RN@0646  07/10/23 MK (NOTE) The GeneXpert MRSA Assay (FDA approved for NASAL specimens only), is one component of a comprehensive MRSA colonization surveillance program. It is not intended to diagnose MRSA infection nor to guide or monitor treatment for MRSA infections. Test performance is not FDA approved in patients less than 33 years old. Performed at [facility], [address], [place], [name] [address]   C Difficile Quick Screen w PCR reflex     Status: Abnormal   Collection Time: 07/10/23  8:32 AM   Specimen: STOOL  Result Value Ref Range Status   C Diff antigen POSITIVE (A) NEGATIVE Final   C Diff toxin NEGATIVE NEGATIVE Final   C Diff interpretation Results are indeterminate. See PCR results.  Final    Comment: Performed at [facility], [address], [place], [name] [address]  C. Diff by PCR, Reflexed     Status: None   Collection Time: 07/10/23  8:32 AM  Result Value Ref Range Status   Toxigenic C. Difficile by PCR NEGATIVE NEGATIVE Final    Comment:  Patient is colonized with non toxigenic C. difficile. May not need treatment unless significant symptoms are present. Performed at [facility], [address], [place], [name] [address]   Culture, blood (Routine X 2) w Reflex to ID Panel     Status: None (Preliminary result)   Collection Time: 07/11/23  8:21 AM   Specimen: BLOOD LEFT ARM  Result Value Ref Range Status   Specimen Description BLOOD LEFT ARM  Final   Special Requests   Final    BOTTLES DRAWN AEROBIC AND ANAEROBIC Blood Culture adequate volume   Culture   Final    NO GROWTH 4 DAYS Performed at [facility], [address], [place], [name] [address]    Report Status PENDING  Incomplete  Culture, blood (Routine X 2) w Reflex to ID Panel     Status: None (Preliminary result)   Collection Time: 07/11/23  8:44 AM   Specimen: BLOOD RIGHT ARM  Result Value Ref Range Status   Specimen Description BLOOD RIGHT ARM  Final   Special Requests   Final    BOTTLES DRAWN AEROBIC ONLY Blood Culture adequate volume   Culture   Final    NO  GROWTH 4 DAYS Performed at [facility], [address], [place], [name] [address]    Report Status PENDING  Incomplete    Studies/Results: MR BRAIN WO CONTRAST  Result Date: 07/15/2023 CLINICAL DATA:  Neuro deficit with acute stroke suspected. Altered mental status. Cervical radiculopathy. EXAM: MRI HEAD WITHOUT CONTRAST MRI CERVICAL SPINE WITHOUT CONTRAST TECHNIQUE: Multiplanar, multiecho pulse sequences of the brain and surrounding structures, and cervical spine, to include the craniocervical junction and cervicothoracic junction, were obtained without intravenous contrast. COMPARISON:  Head and cervical spine CT 08/08/2021. Head CT 07/09/2023 FINDINGS: MRI HEAD FINDINGS Brain: Sizable infarct in the right frontal lobe and insula extending into the parietal lobe, upper division MCA territory. Small acute infarcts seen in the left occipital lobe and bilateral cerebellum. ADC map  correlation is limited due to the to the degree of motion artifact. Petechial hemorrhage by gradient imaging and T1 hyperintensity at the level of the confluent right cerebral infarct. No hematoma, hydrocephalus, or shift. Vascular: Grossly preserved flow voids Skull and upper cervical spine: Normal marrow signal Sinuses/Orbits: Opacified left maxillary sinus, new from 2022 and also seen on most recent head CT. MRI CERVICAL SPINE FINDINGS Alignment: Normal Vertebrae: Diffusely hypointense marrow on T1 weighted imaging, likely related to patient's history of end-stage renal disease with anemia. No focal lesion is seen. On STIR imaging no detected marrow edema to imply infection or fracture Cord: Normal shape and volume by sagittal T1 weighted imaging; nondiagnostic assessment on the other sequences. Posterior Fossa, vertebral arteries, paraspinal tissues: Prevertebral edema/fluid without visible source. There is muscular and subcutaneous edema which is generalized and could be from volume overload. Disc levels: No visible herniation or impingement These results will be called to the ordering clinician or representative by the Radiologist Assistant, and communication documented in the PACS or [employer]. IMPRESSION: Brain MRI: 1. 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 Small acute infarcts in the left occipital lobe and bilateral cerebellum. Petechial hemorrhage is present without hematoma. No worrisome mass effect. 2. Significant motion artifact. Cervical MRI: 1. Marked motion artifact with most images being nondiagnostic. 2. Prevertebral edema/fluid but no visible spinal source or drainable collection. Electronically Signed   By: Tiburcio Pea M.D.   On: 07/15/2023 12:35   MR CERVICAL SPINE WO CONTRAST  Result Date: 07/15/2023 CLINICAL DATA:  Neuro deficit with acute stroke suspected. Altered mental status. Cervical radiculopathy. EXAM: MRI HEAD WITHOUT CONTRAST MRI CERVICAL SPINE  WITHOUT CONTRAST TECHNIQUE: Multiplanar, multiecho pulse sequences of the brain and surrounding structures, and cervical spine, to include the craniocervical junction and cervicothoracic junction, were obtained without intravenous contrast. COMPARISON:  Head and cervical spine CT 08/08/2021. Head CT 07/09/2023 FINDINGS: MRI HEAD FINDINGS Brain: Sizable infarct in the right frontal lobe and insula extending into the parietal lobe, upper division MCA territory. Small acute infarcts seen in the left occipital lobe and bilateral cerebellum. ADC map correlation is limited due to the to the degree of motion artifact. Petechial hemorrhage by gradient imaging and T1 hyperintensity at the level of the confluent right cerebral infarct. No hematoma, hydrocephalus, or shift. Vascular: Grossly preserved flow voids Skull and upper cervical spine: Normal marrow signal Sinuses/Orbits: Opacified left maxillary sinus, new from 2022 and also seen on most recent head CT. MRI CERVICAL SPINE FINDINGS Alignment: Normal Vertebrae: Diffusely hypointense marrow on T1 weighted imaging, likely related to patient's history of end-stage renal disease with anemia. No focal lesion is seen. On STIR imaging no detected marrow edema to  imply infection or fracture Cord: Normal shape and volume by sagittal T1 weighted imaging; nondiagnostic assessment on the other sequences. Posterior Fossa, vertebral arteries, paraspinal tissues: Prevertebral edema/fluid without visible source. There is muscular and subcutaneous edema which is generalized and could be from volume overload. Disc levels: No visible herniation or impingement These results will be called to the ordering clinician or representative by the Radiologist Assistant, and communication documented in the PACS or [employer]. IMPRESSION: Brain MRI: 1. 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 Small acute infarcts in the left occipital lobe and bilateral cerebellum.  Petechial hemorrhage is present without hematoma. No worrisome mass effect. 2. Significant motion artifact. Cervical MRI: 1. Marked motion artifact with most images being nondiagnostic. 2. Prevertebral edema/fluid but no visible spinal source or drainable collection. Electronically Signed   By: Tiburcio Pea M.D.   On: 07/15/2023 12:35   ECHO TEE  Result Date: 07/14/2023    TRANSESOPHOGEAL ECHO REPORT   Patient Name:   [name] Date of Exam: 07/14/2023 Medical Rec #:  [id number]     Height:       67.0 in Accession #:    [id number]    Weight:       166.9 lb Date of Birth:  08-25-1981    BSA:          1.873 m Patient Age:    41 years      BP:           127/60 mmHg Patient Gender: M             HR:           92 bpm. Exam Location:  Inpatient Procedure: Transesophageal Echo, 3D Echo, Cardiac Doppler and Color Doppler Indications:    Bacteremia  History:        Patient has no prior history of Echocardiogram examinations.                 Aortic Valve Disease, Mitral Valve Disease, Pulmonic Valve                 Disease and Endocarditis; Signs/Symptoms:Bacteremia.  Sonographer:    Lucendia Herrlich RCS Referring Phys: [id number] Jeanella Craze PROCEDURE: After discussion of the risks and benefits of a TEE, an informed consent was obtained from the patient. The transesophogeal probe was passed without difficulty through the esophogus of the patient. Sedation performed by different physician. The patient's vital signs; including heart rate, blood pressure, and oxygen saturation; remained stable throughout the procedure. The patient developed no complications during the procedure.  IMPRESSIONS  1. Left ventricular ejection fraction, by estimation, is 30 to 35%. The left ventricle has moderately decreased function. The left ventricle has no regional wall motion abnormalities.  2. Right ventricular systolic function is normal. The right ventricular size is normal.  3. No left atrial/left atrial appendage thrombus was  detected.  4. Large mass (2.00 cm x 0.88 cm ) on the posterior mitral leaflet which appears to be extending in the mitral annulus . Will benefit from cardiac CT morphology. The mitral valve is abnormal. Mild to moderate mitral valve regurgitation. No evidence of mitral stenosis.  5. There are two mobile masses visible on the anterior leaflet of the tricupid valve which also appears to have a perforation. Another mobile mass ( 0.68 cm x 0.67 cm) on the posterior tricupid leaflet. Moderate Tricuspid regurgitation which appears to be through the leaflet perforation . The tricuspid valve is abnormal.  6. The aortic valve is abnormal. Aortic valve regurgitation is not visualized. No aortic stenosis is present.  7. The inferior vena cava is normal in size with greater than 50% respiratory variability, suggesting right atrial pressure of 3 mmHg. 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 Findings concerning for tricuspid valve vegetation. Cardiac CT morphology will be beneficial. Consult for CT surgery. FINDINGS  Left Ventricle: Left ventricular ejection fraction, by estimation, is 30 to 35%. The left ventricle has moderately decreased function. The left ventricle has no regional wall motion abnormalities. The left ventricular internal cavity size was normal in size. There is no left ventricular hypertrophy. Right Ventricle: The right ventricular size is normal. No increase in right ventricular wall thickness. Right ventricular systolic function is normal. Left Atrium: Left atrial size was normal in size. No left atrial/left atrial appendage thrombus was detected. Right Atrium: Right atrial size was normal in size. Pericardium: There is no evidence of pericardial effusion. Mitral Valve: Large mass (2.00 cm x 0.88 cm ) on the posterior mitral leaflet which appears to be extending in the mitral annulus . Will benefit from cardiac CT morphology. The mitral  valve is abnormal. Mild to moderate mitral valve regurgitation. No evidence of mitral valve stenosis. Tricuspid Valve: There are two mobile masses visible on the anterior leaflet of the tricupid valve which also appears to have a perforation. Another mobile mass ( 0.68 cm x 0.67 cm) on the posterior tricupid leaflet. Moderate Tricuspid regurgitation which appears to be through the leaflet perforation. The tricuspid valve is abnormal. Aortic Valve: The aortic valve is severely thickened. There is a filamentous mass (0.56 cm) on the. The aortic valve is abnormal. Aortic valve regurgitation is not visualized. No aortic stenosis is present. Pulmonic Valve: The pulmonic valve was normal in structure. Pulmonic valve regurgitation is not visualized. No evidence of pulmonic stenosis. Aorta: The aortic root and ascending aorta are structurally normal, with no evidence of dilitation. Venous: The right upper pulmonary vein, right lower pulmonary vein, left upper pulmonary vein and left lower pulmonary vein are normal. A normal flow pattern is recorded from the left upper pulmonary vein. The inferior vena cava is normal in size with greater than 50% respiratory variability, suggesting right atrial pressure of 3 mmHg. IAS/Shunts: No atrial level shunt detected by color flow Doppler. Additional Comments: Spectral Doppler performed.  AORTA Ao Root diam: 3.00 cm Ao Asc diam:  2.90 cm Kardie Tobb DO Electronically signed by Thomasene Ripple DO Signature Date/Time: 07/14/2023/4:50:32 PM    Final    EP STUDY  Result Date: 07/14/2023 See surgical note for result.     Assessment/Plan:  INTERVAL HISTORY:  HD catheter removed today  Principal Problem:   MRSA bacteremia Active Problems:   DKA (diabetic ketoacidosis) ([name])   Altered mental status   Hemodialysis catheter infection ([name])   Right elbow pain   Endocarditis of mitral valve   ESRD on hemodialysis ([name])   Acute systolic CHF (congestive heart failure) ([name])   DCM  (dilated cardiomyopathy) ([name])   Coronary artery calcification seen on CAT scan    [name] is a 42 y.o. 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 Not clear whether he did or did not receive antibiotics for 4 weeks despite leaving AMA but he is again admitted with MRSA uremia and sepsis: He has been found  to have filamentous structure on the Mitral valve. TEE confirms this as a quite large vegetation but also has two vegetations on the Tricuspid valve with possible tricuspid valve perforation and AV thickening  #1 MRSA  bacteremia, sepsis and MV, TV and possibly AV endocarditis  MV is large and at risk of embolizing  Tricuspid valve is possibly perforated  Hemodialysis catheter has been removed  I would delay placement of new dialysis catheter for as long as possible.  We are planning on giving him 6 weeks of vancomycin with day #1 being today.  Cardiothoracic surgery want to repeat a transesophageal echocardiogram in 1 weeks time as well.    I have personally spent 50 minutes involved in face-to-face and non-face-to-face activities for this patient on the day of the visit. Professional time spent includes the following activities: Preparing to see the patient (review of tests), Obtaining and/or reviewing separately obtained history (admission/discharge record), Performing a medically appropriate examination and/or evaluation , Ordering medications/tests/procedures, referring and communicating with other health care professionals, Documenting clinical information in the EMR, Independently interpreting results (not separately reported), Communicating results to the patient/family/caregiver, Counseling and educating the patient/family/caregiver and Care coordination (not separately reported).      LOS: 5 days   Acey Lav 07/15/2023, 2:24 PM

## 2023-07-15 NOTE — Progress Notes (Signed)
Out Patient Arrangements:  Pt recevies HD @ [facility] on their MWF schedule  Andree Elk [facility] [phone]

## 2023-07-15 NOTE — Progress Notes (Signed)
Pharmacy Antibiotic Note  [name] is a 42 y.o. male admitted on 07/09/2023 with sepsis, found down. MRSA bacteremia. ESRD- last HD ended 10/16 at ~16:00. Vancomycin 750mg  given after HD. Next HD on Friday.   Vancomycin random level of 25 (10/17 15:52). Goal 15-25 in HD patients. His last dialysis session ran for 3.25 hours but blood flow rates were ~250 mL/min for most of the session. If dialysis BFR continues to be < 350 mL/min with each HD session, would consider lowering HD dose of vancomycin to 500 mg post HD.   Plan: Continue Vancomycin 750 mg post HD MWF - next dose due Friday F/u culture data for antibiotic regimen, continue until finalized  Weight: 74.5 kg (164 lb 3.9 oz)  Temp (24hrs), Avg:97.7 F (36.5 C), Min:96.6 F (35.9 C), Max:98.3 F (36.8 C)  Recent Labs  Lab 07/09/23 2326 07/10/23 0014 07/10/23 0142 07/10/23 0551 07/10/23 0901 07/10/23 1309 07/11/23 0547 07/11/23 1300 07/12/23 0520 07/13/23 0507 07/14/23 0910 07/15/23 0609 07/15/23 1500  WBC  --   --  20.5*  --   --   --  16.0*  --  12.1*  --  15.3* 13.7*  --   CREATININE  --    < > 15.81* 15.93* 15.12*   < > 15.34* 15.58* 8.83* 10.30* 11.70* 8.54*  --   LATICACIDVEN 2.4*  --  1.9 2.2* 2.3*  --   --   --   --   --   --   --   --   VANCORANDOM  --   --   --   --   --   --   --   --   --   --   --   --  25   < > = values in this interval not displayed.    Estimated Creatinine Clearance: 10.4 mL/min (A) (by C-G formula based on SCr of 8.54 mg/dL (H)).    No Known Allergies  Microbiology results: 10/12 BCx: MRSA 10/12 MRSA PCR: positive 10/12 Cdiff PCR positive, toxin negative  Thank you for allowing pharmacy to be a part of this patient's care.  Cedric Fishman, PharmD, BCPS, BCCCP Clinical Pharmacist

## 2023-07-16 ENCOUNTER — Inpatient Hospital Stay (HOSPITAL_COMMUNITY): Payer: Medicare HMO

## 2023-07-16 DIAGNOSIS — T80211A Bloodstream infection due to central venous catheter, initial encounter: Secondary | ICD-10-CM | POA: Diagnosis not present

## 2023-07-16 DIAGNOSIS — I6340 Cerebral infarction due to embolism of unspecified cerebral artery: Secondary | ICD-10-CM

## 2023-07-16 DIAGNOSIS — I639 Cerebral infarction, unspecified: Secondary | ICD-10-CM | POA: Diagnosis not present

## 2023-07-16 DIAGNOSIS — I330 Acute and subacute infective endocarditis: Secondary | ICD-10-CM | POA: Diagnosis not present

## 2023-07-16 DIAGNOSIS — B9562 Methicillin resistant Staphylococcus aureus infection as the cause of diseases classified elsewhere: Secondary | ICD-10-CM | POA: Diagnosis not present

## 2023-07-16 DIAGNOSIS — I059 Rheumatic mitral valve disease, unspecified: Secondary | ICD-10-CM | POA: Diagnosis not present

## 2023-07-16 DIAGNOSIS — N186 End stage renal disease: Secondary | ICD-10-CM | POA: Diagnosis not present

## 2023-07-16 DIAGNOSIS — R7881 Bacteremia: Secondary | ICD-10-CM | POA: Diagnosis not present

## 2023-07-16 HISTORY — PX: IR FLUORO GUIDE CV LINE LEFT: IMG2282

## 2023-07-16 HISTORY — PX: IR VENO/JUGULAR LEFT: IMG2274

## 2023-07-16 HISTORY — PX: IR US GUIDE VASC ACCESS LEFT: IMG2389

## 2023-07-16 HISTORY — PX: IR US GUIDE VASC ACCESS RIGHT: IMG2390

## 2023-07-16 HISTORY — PX: IR VENO/JUGULAR RIGHT: IMG2275

## 2023-07-16 LAB — GLUCOSE, CAPILLARY
Glucose-Capillary: 267 mg/dL — ABNORMAL HIGH (ref 70–99)
Glucose-Capillary: 267 mg/dL — ABNORMAL HIGH (ref 70–99)
Glucose-Capillary: 284 mg/dL — ABNORMAL HIGH (ref 70–99)
Glucose-Capillary: 220 mg/dL — ABNORMAL HIGH (ref 70–99)

## 2023-07-16 LAB — RENAL FUNCTION PANEL
Albumin: 2 g/dL — ABNORMAL LOW (ref 3.5–5.0)
Anion gap: 19 — ABNORMAL HIGH (ref 5–15)
BUN: 61 mg/dL — ABNORMAL HIGH (ref 6–20)
CO2: 21 mmol/L — ABNORMAL LOW (ref 22–32)
Calcium: 7.9 mg/dL — ABNORMAL LOW (ref 8.9–10.3)
Chloride: 91 mmol/L — ABNORMAL LOW (ref 98–111)
Creatinine, Ser: 10.04 mg/dL — ABNORMAL HIGH (ref 0.61–1.24)
GFR, Estimated: 6 mL/min — ABNORMAL LOW (ref >60)
Glucose, Bld: 223 mg/dL — ABNORMAL HIGH (ref 70–99)
Phosphorus: 3.7 mg/dL (ref 2.5–4.6)
Potassium: 3.5 mmol/L (ref 3.5–5.1)
Sodium: 131 mmol/L — ABNORMAL LOW (ref 135–145)

## 2023-07-16 LAB — CULTURE, BLOOD (ROUTINE X 2)
Culture: NO GROWTH
Culture: NO GROWTH
Special Requests: ADEQUATE
Special Requests: ADEQUATE

## 2023-07-16 LAB — CBC
HCT: 27.8 % — ABNORMAL LOW (ref 39.0–52.0)
Hemoglobin: 8.9 g/dL — ABNORMAL LOW (ref 13.0–17.0)
MCH: 27.3 pg (ref 26.0–34.0)
MCHC: 32 g/dL (ref 30.0–36.0)
MCV: 85.3 fL (ref 80.0–100.0)
Platelets: 168 10*3/uL (ref 150–400)
RBC: 3.26 MIL/uL — ABNORMAL LOW (ref 4.22–5.81)
RDW: 14.6 % (ref 11.5–15.5)
WBC: 14.9 10*3/uL — ABNORMAL HIGH (ref 4.0–10.5)
nRBC: 0.1 % (ref 0.0–0.2)

## 2023-07-16 LAB — LIPID PANEL
Cholesterol: 88 mg/dL (ref 0–200)
HDL: 27 mg/dL — ABNORMAL LOW (ref >40)
LDL Cholesterol: 34 mg/dL (ref 0–99)
Total CHOL/HDL Ratio: 3.3 {ratio}
Triglycerides: 137 mg/dL (ref <150)
VLDL: 27 mg/dL (ref 0–40)

## 2023-07-16 MED ORDER — LIDOCAINE-EPINEPHRINE 1 %-1:100000 IJ SOLN
INTRAMUSCULAR | Status: AC
  Filled 2023-07-16: qty 1

## 2023-07-16 MED ORDER — FENTANYL CITRATE (PF) 100 MCG/2ML IJ SOLN
INTRAMUSCULAR | Status: AC
  Filled 2023-07-16: qty 2

## 2023-07-16 MED ORDER — FENTANYL CITRATE (PF) 100 MCG/2ML IJ SOLN
INTRAMUSCULAR | Status: AC | PRN
Start: 2023-07-16 — End: 2023-07-16
  Administered 2023-07-16 (×2): 25 ug via INTRAVENOUS

## 2023-07-16 MED ORDER — OXYCODONE HCL 5 MG PO TABS
5.0000 mg | ORAL_TABLET | Freq: Four times a day (QID) | ORAL | Status: DC | PRN
  Administered 2023-07-17: 5 mg via ORAL
  Filled 2023-07-16 (×2): qty 1

## 2023-07-16 MED ORDER — LIDOCAINE HCL (PF) 1 % IJ SOLN
30.0000 mL | Freq: Once | INTRAMUSCULAR | Status: AC
  Administered 2023-07-16: 30 mL
  Filled 2023-07-16: qty 30

## 2023-07-16 MED ORDER — HEPARIN SODIUM (PORCINE) 1000 UNIT/ML IJ SOLN: 4600.0000 [IU] | Freq: Once | INTRAMUSCULAR | Status: DC

## 2023-07-16 MED ORDER — HEPARIN SODIUM (PORCINE) 1000 UNIT/ML IJ SOLN
INTRAMUSCULAR | Status: AC
  Filled 2023-07-16: qty 10

## 2023-07-16 MED ORDER — IOHEXOL 300 MG/ML  SOLN
50.0000 mL | Freq: Once | INTRAMUSCULAR | Status: AC | PRN
  Administered 2023-07-16: 20 mL via INTRAVENOUS

## 2023-07-16 MED ORDER — CEFAZOLIN SODIUM-DEXTROSE 2-4 GM/100ML-% IV SOLN
INTRAVENOUS | Status: AC
  Filled 2023-07-16: qty 100

## 2023-07-16 MED ORDER — MIDAZOLAM HCL 2 MG/2ML IJ SOLN
INTRAMUSCULAR | Status: AC
  Filled 2023-07-16: qty 2

## 2023-07-16 MED ORDER — MIDAZOLAM HCL 2 MG/2ML IJ SOLN
INTRAMUSCULAR | Status: AC | PRN
Start: 2023-07-16 — End: 2023-07-16
  Administered 2023-07-16 (×2): .5 mg via INTRAVENOUS

## 2023-07-16 NOTE — Plan of Care (Signed)
[facility] Tonsillectomy/Adenoidectomy, Postoperative PEDS care plan entered in error.

## 2023-07-16 NOTE — Progress Notes (Signed)
Subjective:  Somnolent   Antibiotics:  Anti-infectives (From admission, onward)    Start     Dose/Rate Route Frequency Ordered Stop   07/16/23 0000  ceFAZolin (ANCEF) IVPB 2g/100 mL premix        2 g 200 mL/hr over 30 Minutes Intravenous To Radiology 07/15/23 1408 07/16/23 1250   07/14/23 1200  vancomycin (VANCOREADY) IVPB 750 mg/150 mL        750 mg 150 mL/hr over 60 Minutes Intravenous Every M-W-F (Hemodialysis) 07/12/23 1203     07/11/23 2330  vancomycin (VANCOREADY) IVPB 750 mg/150 mL        750 mg 150 mL/hr over 60 Minutes Intravenous  Once 07/11/23 2030 07/12/23 0110   07/11/23 1800  vancomycin (VANCOREADY) IVPB 750 mg/150 mL  Status:  Discontinued        750 mg 150 mL/hr over 60 Minutes Intravenous  Once 07/11/23 0856 07/11/23 1810   07/10/23 2318  vancomycin variable dose per unstable renal function (pharmacist dosing)  Status:  Discontinued         Does not apply See admin instructions 07/10/23 2319 07/12/23 1203   07/10/23 1429  vancomycin (VANCOREADY) IVPB 750 mg/150 mL  Status:  Discontinued        750 mg 150 mL/hr over 60 Minutes Intravenous Every Dialysis 07/10/23 1429 07/11/23 2030   07/10/23 1230  fidaxomicin (DIFICID) tablet 200 mg  Status:  Discontinued        200 mg Oral 2 times daily 07/10/23 1139 07/10/23 1207   07/10/23 1000  piperacillin-tazobactam (ZOSYN) IVPB 2.25 g  Status:  Discontinued        2.25 g 100 mL/hr over 30 Minutes Intravenous Every 8 hours 07/10/23 0908 07/10/23 1350   07/09/23 2330  vancomycin (VANCOREADY) IVPB 1750 mg/350 mL        1,750 mg 175 mL/hr over 120 Minutes Intravenous  Once 07/09/23 2329 07/10/23 0734   07/09/23 2330  piperacillin-tazobactam (ZOSYN) IVPB 3.375 g        3.375 g 100 mL/hr over 30 Minutes Intravenous  Once 07/09/23 2329 07/10/23 0240       Medications: Scheduled Meds:  calcitRIOL  1.5 mcg Oral Q M,W,F-HD   Chlorhexidine Gluconate Cloth  6 each Topical Q0600   cinacalcet  120 mg Oral Q supper    darbepoetin (ARANESP) injection - DIALYSIS  40 mcg Subcutaneous Q Fri-1800   famotidine  10 mg Oral Daily   feeding supplement (GLUCERNA SHAKE)  237 mL Oral TID BM   gabapentin  100 mg Oral TID   heparin sodium (porcine)  4,600 Units Intracatheter Once   insulin aspart  0-6 Units Subcutaneous TID WC   insulin glargine-yfgn  10 Units Subcutaneous Daily   multivitamin with minerals  1 tablet Oral QHS   sevelamer carbonate  2,400 mg Oral TID WC   Continuous Infusions:  vancomycin Stopped (07/14/23 1842)   PRN Meds:.acetaminophen, dextrose, docusate sodium, heparin sodium (porcine), mouth rinse, oxyCODONE, polyethylene glycol, promethazine    Objective: Weight change:   Intake/Output Summary (Last 24 hours) at 07/16/2023 1355 Last data filed at 07/15/2023 1400 Gross per 24 hour  Intake 120 ml  Output --  Net 120 ml   Blood pressure (!) 106/59, pulse (!) 110, temperature 98.4 F (36.9 C), temperature source Oral, resp. rate 17, weight 74.5 kg, SpO2 96%. Temp:  [97.7 F (36.5 C)-99.8 F (37.7 C)] 98.4 F (36.9 C) (10/18 0934) Pulse Rate:  [89-111] 110 (10/18  1310) Resp:  [1-20] 17 (10/18 1310) BP: (96-133)/(53-83) 106/59 (10/18 1310) SpO2:  [94 %-100 %] 96 % (10/18 1310) FiO2 (%):  [2 %] 2 % (10/18 1235)  Physical Exam: Physical Exam Eyes:     General:        Right eye: No discharge.        Left eye: No discharge.  Pulmonary:     Effort: No respiratory distress.     Breath sounds: No wheezing.  Abdominal:     General: There is no distension.  Skin:    General: Skin is warm and dry.  Neurological:     Mental Status: He is lethargic and disoriented.     Motor: Weakness present.     Comments: He made no purposeful movement in LUE or LLE and when I raised left arm up it would collapse into bed        CBC:    BMET Recent Labs    07/14/23 0910 07/15/23 0609  NA 127* 131*  K 4.3 4.1  CL 92* 89*  CO2 16* 14*  GLUCOSE 191* 332*  BUN 62* 49*  CREATININE  11.70* 8.54*  CALCIUM 8.1* 8.3*     Liver Panel  No results for input(s): "PROT", "ALBUMIN", "AST", "ALT", "ALKPHOS", "BILITOT", "BILIDIR", "IBILI" in the last 72 hours.      Sedimentation Rate No results for input(s): "ESRSEDRATE" in the last 72 hours. C-Reactive Protein No results for input(s): "CRP" in the last 72 hours.  Micro Results: Recent Results (from the past 720 hour(s))  Culture, blood (routine x 2)     Status: Abnormal   Collection Time: 07/10/23 12:15 AM   Specimen: BLOOD  Result Value Ref Range Status   Specimen Description BLOOD BLOOD RIGHT ARM  Final   Special Requests   Final    BOTTLES DRAWN AEROBIC AND ANAEROBIC Blood Culture results may not be optimal due to an excessive volume of blood received in culture bottles   Culture  Setup Time   Final    GRAM POSITIVE COCCI IN CLUSTERS IN BOTH AEROBIC AND ANAEROBIC BOTTLES CRITICAL RESULT CALLED TO, READ BACK BY AND VERIFIED WITH: Ebony Cargo [id number] AT 1234 BY EC Performed at [facility], [address] [place], [name] [address]    Culture METHICILLIN RESISTANT STAPHYLOCOCCUS AUREUS (A)  Final   Report Status 07/12/2023 FINAL  Final   Organism ID, Bacteria METHICILLIN RESISTANT STAPHYLOCOCCUS AUREUS  Final      Susceptibility   Methicillin resistant staphylococcus aureus - MIC*    CIPROFLOXACIN >=8 RESISTANT Resistant     ERYTHROMYCIN >=8 RESISTANT Resistant     GENTAMICIN <=0.5 SENSITIVE Sensitive     OXACILLIN >=4 RESISTANT Resistant     TETRACYCLINE <=1 SENSITIVE Sensitive     VANCOMYCIN 1 SENSITIVE Sensitive     TRIMETH/SULFA <=10 SENSITIVE Sensitive     CLINDAMYCIN <=0.25 SENSITIVE Sensitive     RIFAMPIN <=0.5 SENSITIVE Sensitive     Inducible Clindamycin NEGATIVE Sensitive     LINEZOLID 2 SENSITIVE Sensitive     * METHICILLIN RESISTANT STAPHYLOCOCCUS AUREUS  Blood Culture ID Panel (Reflexed)     Status: Abnormal   Collection Time: 07/10/23 12:15 AM  Result Value Ref Range Status    Enterococcus faecalis NOT DETECTED NOT DETECTED Final   Enterococcus Faecium NOT DETECTED NOT DETECTED Final   Listeria monocytogenes NOT DETECTED NOT DETECTED Final   Staphylococcus species DETECTED (A) NOT DETECTED Final    Comment: CRITICAL RESULT  CALLED TO, READ BACK BY AND VERIFIED WITH: PHARMD JENNY ZHOU [id number] AT 1234 BY EC    Staphylococcus aureus (BCID) DETECTED (A) NOT DETECTED Final    Comment: Methicillin (oxacillin)-resistant Staphylococcus aureus (MRSA). MRSA is predictably resistant to beta-lactam antibiotics (except ceftaroline). Preferred therapy is vancomycin unless clinically contraindicated. Patient requires contact precautions if  hospitalized. CRITICAL RESULT CALLED TO, READ BACK BY AND VERIFIED WITH: PHARMD JENNY ZHOU [phone] AT 1234 BY EC    Staphylococcus epidermidis NOT DETECTED NOT DETECTED Final   Staphylococcus lugdunensis NOT DETECTED NOT DETECTED Final   Streptococcus species NOT DETECTED NOT DETECTED Final   Streptococcus agalactiae NOT DETECTED NOT DETECTED Final   Streptococcus pneumoniae NOT DETECTED NOT DETECTED Final   Streptococcus pyogenes NOT DETECTED NOT DETECTED Final   A.calcoaceticus-baumannii NOT DETECTED NOT DETECTED Final   Bacteroides fragilis NOT DETECTED NOT DETECTED Final   Enterobacterales NOT DETECTED NOT DETECTED Final   Enterobacter cloacae complex NOT DETECTED NOT DETECTED Final   Escherichia coli NOT DETECTED NOT DETECTED Final   Klebsiella aerogenes NOT DETECTED NOT DETECTED Final   Klebsiella oxytoca NOT DETECTED NOT DETECTED Final   Klebsiella pneumoniae NOT DETECTED NOT DETECTED Final   Proteus species NOT DETECTED NOT DETECTED Final   Salmonella species NOT DETECTED NOT DETECTED Final   Serratia marcescens NOT DETECTED NOT DETECTED Final   Haemophilus influenzae NOT DETECTED NOT DETECTED Final   Neisseria meningitidis NOT DETECTED NOT DETECTED Final   Pseudomonas aeruginosa NOT DETECTED NOT DETECTED Final    Stenotrophomonas maltophilia NOT DETECTED NOT DETECTED Final   Candida albicans NOT DETECTED NOT DETECTED Final   Candida auris NOT DETECTED NOT DETECTED Final   Candida glabrata NOT DETECTED NOT DETECTED Final   Candida krusei NOT DETECTED NOT DETECTED Final   Candida parapsilosis NOT DETECTED NOT DETECTED Final   Candida tropicalis NOT DETECTED NOT DETECTED Final   Cryptococcus neoformans/gattii NOT DETECTED NOT DETECTED Final   Meth resistant mecA/C and MREJ DETECTED (A) NOT DETECTED Final    Comment: CRITICAL RESULT CALLED TO, READ BACK BY AND VERIFIED WITH: Valarie Merino ZHOU [id number] AT 1234 BY EC Performed at [facility], [address] [place], [name] [address]   Culture, blood (routine x 2)     Status: Abnormal   Collection Time: 07/10/23 12:16 AM   Specimen: BLOOD  Result Value Ref Range Status   Specimen Description BLOOD BLOOD RIGHT HAND  Final   Special Requests   Final    BOTTLES DRAWN AEROBIC AND ANAEROBIC Blood Culture results may not be optimal due to an excessive volume of blood received in culture bottles   Culture  Setup Time   Final    GRAM POSITIVE COCCI IN CLUSTERS IN BOTH AEROBIC AND ANAEROBIC BOTTLES CRITICAL VALUE NOTED.  VALUE IS CONSISTENT WITH PREVIOUSLY REPORTED AND CALLED VALUE.    Culture (A)  Final    STAPHYLOCOCCUS AUREUS SUSCEPTIBILITIES PERFORMED ON PREVIOUS CULTURE WITHIN THE LAST 5 DAYS. Performed at [facility], [address] [place], [name] [address]    Report Status 07/12/2023 FINAL  Final  SARS Coronavirus 2 by RT PCR (hospital order, performed in [facility] hospital lab) *cepheid single result test*     Status: None   Collection Time: 07/10/23 12:17 AM   Specimen: Nasal Swab  Result Value Ref Range Status   SARS Coronavirus 2 by RT PCR NEGATIVE NEGATIVE Final    Comment: Performed at [facility], [address], [place], [name]  [address]  MRSA Next Gen by PCR, Nasal     Status: Abnormal   Collection Time:  07/10/23  3:26 AM   Specimen: Nasal Mucosa; Nasal Swab  Result Value Ref Range Status   MRSA by PCR Next Gen DETECTED (A) NOT DETECTED Final    Comment: RESULT CALLED TO, READ BACK BY AND VERIFIED WITH: B CUMMINGS,RN@[id number]  07/10/23 MK (NOTE) The GeneXpert MRSA Assay (FDA approved for NASAL specimens only), is one component of a comprehensive MRSA colonization surveillance program. It is not intended to diagnose MRSA infection nor to guide or monitor treatment for MRSA infections. Test performance is not FDA approved in patients less than 41 years old. Performed at [facility], [address] [place], [name] [address]   C Difficile Quick Screen w PCR reflex     Status: Abnormal   Collection Time: 07/10/23  8:32 AM   Specimen: STOOL  Result Value Ref Range Status   C Diff antigen POSITIVE (A) NEGATIVE Final   C Diff toxin NEGATIVE NEGATIVE Final   C Diff interpretation Results are indeterminate. See PCR results.  Final    Comment: Performed at [facility], [address], [place], [name] [address]  C. Diff by PCR, Reflexed     Status: None   Collection Time: 07/10/23  8:32 AM  Result Value Ref Range Status   Toxigenic C. Difficile by PCR NEGATIVE NEGATIVE Final    Comment: Patient is colonized with non toxigenic C. difficile. May not need treatment unless significant symptoms are present. Performed at [facility], [address], [place], [name] [address]   Culture, blood (Routine X 2) w Reflex to ID Panel     Status: None   Collection Time: 07/11/23  8:21 AM   Specimen: BLOOD LEFT ARM  Result Value Ref Range Status   Specimen Description BLOOD LEFT ARM  Final   Special Requests   Final    BOTTLES DRAWN AEROBIC AND ANAEROBIC Blood Culture adequate volume   Culture   Final    NO GROWTH 5 DAYS Performed at [facility], [address], [place], [name] [address]    Report Status 07/16/2023 FINAL  Final  Culture, blood (Routine X 2) w Reflex to ID  Panel     Status: None   Collection Time: 07/11/23  8:44 AM   Specimen: BLOOD RIGHT ARM  Result Value Ref Range Status   Specimen Description BLOOD RIGHT ARM  Final   Special Requests   Final    BOTTLES DRAWN AEROBIC ONLY Blood Culture adequate volume   Culture   Final    NO GROWTH 5 DAYS Performed at [facility], [address] [place], [name] [address]    Report Status 07/16/2023 FINAL  Final  Culture, blood (Routine X 2) w Reflex to ID Panel     Status: None (Preliminary result)   Collection Time: 07/15/23  3:00 PM   Specimen: BLOOD RIGHT ARM  Result Value Ref Range Status   Specimen Description BLOOD RIGHT ARM  Final   Special Requests   Final    BOTTLES DRAWN AEROBIC AND ANAEROBIC Blood Culture adequate volume   Culture   Final    NO GROWTH < 24 HOURS Performed at [facility], [address], [place], [name] [address]    Report Status PENDING  Incomplete  Culture, blood (Routine X 2) w Reflex to ID Panel     Status: None (Preliminary  result)   Collection Time: 07/15/23  3:02 PM   Specimen: BLOOD RIGHT ARM  Result Value Ref Range Status   Specimen Description BLOOD RIGHT ARM  Final   Special Requests   Final    BOTTLES DRAWN AEROBIC AND ANAEROBIC Blood Culture adequate volume   Culture   Final    NO GROWTH < 24 HOURS Performed at [facility], [address] [place], [name] [address]    Report Status PENDING  Incomplete    Studies/Results: IR Fluoro Guide CV Line Left  Result Date: 07/16/2023 INDICATION: ESRD on HD.  Challenging vascular access. EXAM: Title: TUNNELED CENTRAL VENOUS HEMODIALYSIS CATHETER PLACEMENT WITH ULTRASOUND AND FLUOROSCOPIC GUIDANCE Procedures: 1. ULTRASOUND-GUIDED RIGHT JUGULAR, LEFT JUGULAR AND LEFT FEMORAL VENOUS ACCESS 2. RIGHT JUGULAR VENOGRAPHY 3. LEFT JUGULAR VENOGRAPHY 4. LEFT FEMORAL TUNNELED HEMODIALYSIS CATHETER PLACEMENT MEDICATIONS: Ancef 2 gm IV . The antibiotic was given in an appropriate time interval prior  to skin puncture. ANESTHESIA/SEDATION: Moderate (conscious) sedation was employed during this procedure. A total of Versed 1 mg and Fentanyl 50 mcg was administered intravenously. Moderate Sedation Time: 54 minutes. The patient's level of consciousness and vital signs were monitored continuously by radiology nursing throughout the procedure under my direct supervision. FLUOROSCOPY TIME:  Fluoroscopic dose; 61 mGy COMPLICATIONS: None immediate. PROCEDURE: Informed written consent was obtained from the patient and/or patient's representative after a discussion of the risks, benefits, and alternatives to treatment. Questions regarding the procedure were encouraged and answered. The operative sites were prepped with chlorhexidine in a sterile fashion, and a sterile drape was applied covering the operative field. Maximum barrier sterile technique with sterile gowns and gloves were used for the procedure. A timeout was performed prior to the initiation of the procedure. Initial access was obtained at the RIGHT neck, however the microwire would not advance. RIGHT jugular venography was then performed. The RIGHT neck access site was aborted. Access was then obtained at the LEFT neck, however the microwire would not advance into the SVC. LEFT jugular venography was also performed. The LEFT neck access site was also aborted. Sonographic evaluation of the LEFT groin demonstrated a widely patent greater saphenous and femoral veins. After creating a small venotomy incision, a micropuncture kit was utilized to access the LEFT greater saphenous vein. Real-time ultrasound guidance was utilized for vascular access including the acquisition of a permanent ultrasound image documenting patency of the accessed vessel. The microwire was utilized to measure appropriate catheter length. A stiff Glidewire was advanced to the level of the IVC and the micropuncture sheath was exchanged for a peel-away sheath. A palindrome tunneled  hemodialysis catheter measuring 33 cm from tip to cuff was tunneled in a retrograde fashion from the lateral LEFT thigh to the venotomy incision. The catheter was then placed through the peel-away sheath with tips ultimately positioned within the infrarenal IVC. Final catheter positioning was confirmed and documented with a spot radiographic image. The catheter aspirates and flushes normally. The catheter was flushed with appropriate volume heparin dwells. The catheter exit site was secured with a 2-0 Ethilon retention suture. The venotomy incision was closed with Dermabond. Dressings were applied. The patient tolerated the procedure well without immediate post procedural complication. IMPRESSION: 1. Occluded superior vena cava, as demonstrated by RIGHT and LEFT jugular venography. 2. Successful placement of a 33 cm tip to cuff tunneled hemodialysis catheter via the LEFT femoral vein. The tip of the catheter is positioned infrarenal IVC. The catheter is ready for immediate use. RECOMMENDATIONS: Challenging vascular  access patient. DO NOT REMOVE THE PATIENT'S HEMODIALYSIS CATHETER without consulting vascular and Interventional Radiology (VIR). Roanna Banning, MD [facility] Radiology Electronically Signed   By: Roanna Banning M.D.   On: 07/16/2023 13:43   IR US Guide Vasc Access Left  Result Date: 07/16/2023 INDICATION: ESRD on HD.  Challenging vascular access. EXAM: Title: TUNNELED CENTRAL VENOUS HEMODIALYSIS CATHETER PLACEMENT WITH ULTRASOUND AND FLUOROSCOPIC GUIDANCE Procedures: 1. ULTRASOUND-GUIDED RIGHT JUGULAR, LEFT JUGULAR AND LEFT FEMORAL VENOUS ACCESS 2. RIGHT JUGULAR VENOGRAPHY 3. LEFT JUGULAR VENOGRAPHY 4. LEFT FEMORAL TUNNELED HEMODIALYSIS CATHETER PLACEMENT MEDICATIONS: Ancef 2 gm IV . The antibiotic was given in an appropriate time interval prior to skin puncture. ANESTHESIA/SEDATION: Moderate (conscious) sedation was employed during this procedure. A  total of Versed 1 mg and Fentanyl 50 mcg was administered intravenously. Moderate Sedation Time: 54 minutes. The patient's level of consciousness and vital signs were monitored continuously by radiology nursing throughout the procedure under my direct supervision. FLUOROSCOPY TIME:  Fluoroscopic dose; 61 mGy COMPLICATIONS: None immediate. PROCEDURE: Informed written consent was obtained from the patient and/or patient's representative after a discussion of the risks, benefits, and alternatives to treatment. Questions regarding the procedure were encouraged and answered. The operative sites were prepped with chlorhexidine in a sterile fashion, and a sterile drape was applied covering the operative field. Maximum barrier sterile technique with sterile gowns and gloves were used for the procedure. A timeout was performed prior to the initiation of the procedure. Initial access was obtained at the RIGHT neck, however the microwire would not advance. RIGHT jugular venography was then performed. The RIGHT neck access site was aborted. Access was then obtained at the LEFT neck, however the microwire would not advance into the SVC. LEFT jugular venography was also performed. The LEFT neck access site was also aborted. Sonographic evaluation of the LEFT groin demonstrated a widely patent greater saphenous and femoral veins. After creating a small venotomy incision, a micropuncture kit was utilized to access the LEFT greater saphenous vein. Real-time ultrasound guidance was utilized for vascular access including the acquisition of a permanent ultrasound image documenting patency of the accessed vessel. The microwire was utilized to measure appropriate catheter length. A stiff Glidewire was advanced to the level of the IVC and the micropuncture sheath was exchanged for a peel-away sheath. A palindrome tunneled hemodialysis catheter measuring 33 cm from tip to cuff was tunneled in a retrograde fashion from the lateral LEFT  thigh to the venotomy incision. The catheter was then placed through the peel-away sheath with tips ultimately positioned within the infrarenal IVC. Final catheter positioning was confirmed and documented with a spot radiographic image. The catheter aspirates and flushes normally. The catheter was flushed with appropriate volume heparin dwells. The catheter exit site was secured with a 2-0 Ethilon retention suture. The venotomy incision was closed with Dermabond. Dressings were applied. The patient tolerated the procedure well without immediate post procedural complication. IMPRESSION: 1. Occluded superior vena cava, as demonstrated by RIGHT and LEFT jugular venography. 2. Successful placement of a 33 cm tip to cuff tunneled hemodialysis catheter via the LEFT femoral vein. The tip of the catheter is positioned infrarenal IVC. The catheter is ready for immediate use. RECOMMENDATIONS: Challenging vascular access patient. DO NOT REMOVE THE PATIENT'S HEMODIALYSIS CATHETER without consulting vascular and Interventional Radiology (VIR). Roanna Banning, MD [facility] Radiology Electronically Signed   By: Roanna Banning M.D.   On: 07/16/2023 13:43   IR Veno/Jugular Right  Result Date: 07/16/2023 INDICATION: ESRD on HD.  Challenging vascular access. EXAM: Title: TUNNELED CENTRAL VENOUS HEMODIALYSIS CATHETER PLACEMENT WITH ULTRASOUND AND FLUOROSCOPIC GUIDANCE Procedures: 1. ULTRASOUND-GUIDED RIGHT JUGULAR, LEFT JUGULAR AND LEFT FEMORAL VENOUS ACCESS 2. RIGHT JUGULAR VENOGRAPHY 3. LEFT JUGULAR VENOGRAPHY 4. LEFT FEMORAL TUNNELED HEMODIALYSIS CATHETER PLACEMENT MEDICATIONS: Ancef 2 gm IV . The antibiotic was given in an appropriate time interval prior to skin puncture. ANESTHESIA/SEDATION: Moderate (conscious) sedation was employed during this procedure. A total of Versed 1 mg and Fentanyl 50 mcg was administered intravenously. Moderate Sedation Time: 54 minutes. The patient's  level of consciousness and vital signs were monitored continuously by radiology nursing throughout the procedure under my direct supervision. FLUOROSCOPY TIME:  Fluoroscopic dose; 61 mGy COMPLICATIONS: None immediate. PROCEDURE: Informed written consent was obtained from the patient and/or patient's representative after a discussion of the risks, benefits, and alternatives to treatment. Questions regarding the procedure were encouraged and answered. The operative sites were prepped with chlorhexidine in a sterile fashion, and a sterile drape was applied covering the operative field. Maximum barrier sterile technique with sterile gowns and gloves were used for the procedure. A timeout was performed prior to the initiation of the procedure. Initial access was obtained at the RIGHT neck, however the microwire would not advance. RIGHT jugular venography was then performed. The RIGHT neck access site was aborted. Access was then obtained at the LEFT neck, however the microwire would not advance into the SVC. LEFT jugular venography was also performed. The LEFT neck access site was also aborted. Sonographic evaluation of the LEFT groin demonstrated a widely patent greater saphenous and femoral veins. After creating a small venotomy incision, a micropuncture kit was utilized to access the LEFT greater saphenous vein. Real-time ultrasound guidance was utilized for vascular access including the acquisition of a permanent ultrasound image documenting patency of the accessed vessel. The microwire was utilized to measure appropriate catheter length. A stiff Glidewire was advanced to the level of the IVC and the micropuncture sheath was exchanged for a peel-away sheath. A palindrome tunneled hemodialysis catheter measuring 33 cm from tip to cuff was tunneled in a retrograde fashion from the lateral LEFT thigh to the venotomy incision. The catheter was then placed through the peel-away sheath with tips ultimately positioned  within the infrarenal IVC. Final catheter positioning was confirmed and documented with a spot radiographic image. The catheter aspirates and flushes normally. The catheter was flushed with appropriate volume heparin dwells. The catheter exit site was secured with a 2-0 Ethilon retention suture. The venotomy incision was closed with Dermabond. Dressings were applied. The patient tolerated the procedure well without immediate post procedural complication. IMPRESSION: 1. Occluded superior vena cava, as demonstrated by RIGHT and LEFT jugular venography. 2. Successful placement of a 33 cm tip to cuff tunneled hemodialysis catheter via the LEFT femoral vein. The tip of the catheter is positioned infrarenal IVC. The catheter is ready for immediate use. RECOMMENDATIONS: Challenging vascular access patient. DO NOT REMOVE THE PATIENT'S HEMODIALYSIS CATHETER without consulting vascular and Interventional Radiology (VIR). Roanna Banning, MD [facility]iology Special[facility] Radiology Electronically Signed   By: Roanna Banning M.D.   On: 07/16/2023 13:43   IR Veno/Jugular Left  Result Date: 07/16/2023 INDICATION: ESRD on HD.  Challenging vascular access. EXAM: Title: TUNNELED CENTRAL VENOUS HEMODIALYSIS CATHETER PLACEMENT WITH ULTRASOUND AND FLUOROSCOPIC GUIDANCE Procedures: 1. ULTRASOUND-GUIDED RIGHT JUGULAR, LEFT JUGULAR AND LEFT FEMORAL VENOUS ACCESS 2. RIGHT JUGULAR VENOGRAPHY 3. LEFT JUGULAR VENOGRAPHY 4. LEFT FEMORAL TUNNELED HEMODIALYSIS  CATHETER PLACEMENT MEDICATIONS: Ancef 2 gm IV . The antibiotic was given in an appropriate time interval prior to skin puncture. ANESTHESIA/SEDATION: Moderate (conscious) sedation was employed during this procedure. A total of Versed 1 mg and Fentanyl 50 mcg was administered intravenously. Moderate Sedation Time: 54 minutes. The patient's level of consciousness and vital signs were monitored continuously by radiology nursing throughout the procedure under my  direct supervision. FLUOROSCOPY TIME:  Fluoroscopic dose; 61 mGy COMPLICATIONS: None immediate. PROCEDURE: Informed written consent was obtained from the patient and/or patient's representative after a discussion of the risks, benefits, and alternatives to treatment. Questions regarding the procedure were encouraged and answered. The operative sites were prepped with chlorhexidine in a sterile fashion, and a sterile drape was applied covering the operative field. Maximum barrier sterile technique with sterile gowns and gloves were used for the procedure. A timeout was performed prior to the initiation of the procedure. Initial access was obtained at the RIGHT neck, however the microwire would not advance. RIGHT jugular venography was then performed. The RIGHT neck access site was aborted. Access was then obtained at the LEFT neck, however the microwire would not advance into the SVC. LEFT jugular venography was also performed. The LEFT neck access site was also aborted. Sonographic evaluation of the LEFT groin demonstrated a widely patent greater saphenous and femoral veins. After creating a small venotomy incision, a micropuncture kit was utilized to access the LEFT greater saphenous vein. Real-time ultrasound guidance was utilized for vascular access including the acquisition of a permanent ultrasound image documenting patency of the accessed vessel. The microwire was utilized to measure appropriate catheter length. A stiff Glidewire was advanced to the level of the IVC and the micropuncture sheath was exchanged for a peel-away sheath. A palindrome tunneled hemodialysis catheter measuring 33 cm from tip to cuff was tunneled in a retrograde fashion from the lateral LEFT thigh to the venotomy incision. The catheter was then placed through the peel-away sheath with tips ultimately positioned within the infrarenal IVC. Final catheter positioning was confirmed and documented with a spot radiographic image. The catheter  aspirates and flushes normally. The catheter was flushed with appropriate volume heparin dwells. The catheter exit site was secured with a 2-0 Ethilon retention suture. The venotomy incision was closed with Dermabond. Dressings were applied. The patient tolerated the procedure well without immediate post procedural complication. IMPRESSION: 1. Occluded superior vena cava, as demonstrated by RIGHT and LEFT jugular venography. 2. Successful placement of a 33 cm tip to cuff tunneled hemodialysis catheter via the LEFT femoral vein. The tip of the catheter is positioned infrarenal IVC. The catheter is ready for immediate use. RECOMMENDATIONS: Challenging vascular access patient. DO NOT REMOVE THE PATIENT'S HEMODIALYSIS CATHETER without consulting vascular and Interventional Radiology (VIR). Roanna Banning, MD Vascular and Interventional Radiology Specialists [facility] Radiology Electronically Signed   By: Roanna Banning M.D.   On: 07/16/2023 13:43   IR US Guide Vasc Access Right  Result Date: 07/16/2023 INDICATION: ESRD on HD.  Challenging vascular access. EXAM: Title: TUNNELED CENTRAL VENOUS HEMODIALYSIS CATHETER PLACEMENT WITH ULTRASOUND AND FLUOROSCOPIC GUIDANCE Procedures: 1. ULTRASOUND-GUIDED RIGHT JUGULAR, LEFT JUGULAR AND LEFT FEMORAL VENOUS ACCESS 2. RIGHT JUGULAR VENOGRAPHY 3. LEFT JUGULAR VENOGRAPHY 4. LEFT FEMORAL TUNNELED HEMODIALYSIS CATHETER PLACEMENT MEDICATIONS: Ancef 2 gm IV . The antibiotic was given in an appropriate time interval prior to skin puncture. ANESTHESIA/SEDATION: Moderate (conscious) sedation was employed during this procedure. A total of Versed 1 mg and Fentanyl 50 mcg was administered intravenously. Moderate Sedation Time:  54 minutes. The patient's level of consciousness and vital signs were monitored continuously by radiology nursing throughout the procedure under my direct supervision. FLUOROSCOPY TIME:  Fluoroscopic dose; 61 mGy COMPLICATIONS: None immediate. PROCEDURE: Informed  written consent was obtained from the patient and/or patient's representative after a discussion of the risks, benefits, and alternatives to treatment. Questions regarding the procedure were encouraged and answered. The operative sites were prepped with chlorhexidine in a sterile fashion, and a sterile drape was applied covering the operative field. Maximum barrier sterile technique with sterile gowns and gloves were used for the procedure. A timeout was performed prior to the initiation of the procedure. Initial access was obtained at the RIGHT neck, however the microwire would not advance. RIGHT jugular venography was then performed. The RIGHT neck access site was aborted. Access was then obtained at the LEFT neck, however the microwire would not advance into the SVC. LEFT jugular venography was also performed. The LEFT neck access site was also aborted. Sonographic evaluation of the LEFT groin demonstrated a widely patent greater saphenous and femoral veins. After creating a small venotomy incision, a micropuncture kit was utilized to access the LEFT greater saphenous vein. Real-time ultrasound guidance was utilized for vascular access including the acquisition of a permanent ultrasound image documenting patency of the accessed vessel. The microwire was utilized to measure appropriate catheter length. A stiff Glidewire was advanced to the level of the IVC and the micropuncture sheath was exchanged for a peel-away sheath. A palindrome tunneled hemodialysis catheter measuring 33 cm from tip to cuff was tunneled in a retrograde fashion from the lateral LEFT thigh to the venotomy incision. The catheter was then placed through the peel-away sheath with tips ultimately positioned within the infrarenal IVC. Final catheter positioning was confirmed and documented with a spot radiographic image. The catheter aspirates and flushes normally. The catheter was flushed with appropriate volume heparin dwells. The catheter exit  site was secured with a 2-0 Ethilon retention suture. The venotomy incision was closed with Dermabond. Dressings were applied. The patient tolerated the procedure well without immediate post procedural complication. IMPRESSION: 1. Occluded superior vena cava, as demonstrated by RIGHT and LEFT jugular venography. 2. Successful placement of a 33 cm tip to cuff tunneled hemodialysis catheter via the LEFT femoral vein. The tip of the catheter is positioned infrarenal IVC. The catheter is ready for immediate use. RECOMMENDATIONS: Challenging vascular access patient. DO NOT REMOVE THE PATIENT'S HEMODIALYSIS CATHETER without consulting vascular and Interventional Radiology (VIR). Roanna Banning, MD Vascular and Interventional Radiology Specialists [facility] Radiology Electronically Signed   By: Roanna Banning M.D.   On: 07/16/2023 13:43   IR US Guide Vasc Access Left  Result Date: 07/16/2023 INDICATION: ESRD on HD.  Challenging vascular access. EXAM: Title: TUNNELED CENTRAL VENOUS HEMODIALYSIS CATHETER PLACEMENT WITH ULTRASOUND AND FLUOROSCOPIC GUIDANCE Procedures: 1. ULTRASOUND-GUIDED RIGHT JUGULAR, LEFT JUGULAR AND LEFT FEMORAL VENOUS ACCESS 2. RIGHT JUGULAR VENOGRAPHY 3. LEFT JUGULAR VENOGRAPHY 4. LEFT FEMORAL TUNNELED HEMODIALYSIS CATHETER PLACEMENT MEDICATIONS: Ancef 2 gm IV . The antibiotic was given in an appropriate time interval prior to skin puncture. ANESTHESIA/SEDATION: Moderate (conscious) sedation was employed during this procedure. A total of Versed 1 mg and Fentanyl 50 mcg was administered intravenously. Moderate Sedation Time: 54 minutes. The patient's level of consciousness and vital signs were monitored continuously by radiology nursing throughout the procedure under my direct supervision. FLUOROSCOPY TIME:  Fluoroscopic dose; 61 mGy COMPLICATIONS: None immediate. PROCEDURE: Informed written consent was obtained from the patient and/or patient's representative after  a discussion of the risks, benefits,  and alternatives to treatment. Questions regarding the procedure were encouraged and answered. The operative sites were prepped with chlorhexidine in a sterile fashion, and a sterile drape was applied covering the operative field. Maximum barrier sterile technique with sterile gowns and gloves were used for the procedure. A timeout was performed prior to the initiation of the procedure. Initial access was obtained at the RIGHT neck, however the microwire would not advance. RIGHT jugular venography was then performed. The RIGHT neck access site was aborted. Access was then obtained at the LEFT neck, however the microwire would not advance into the SVC. LEFT jugular venography was also performed. The LEFT neck access site was also aborted. Sonographic evaluation of the LEFT groin demonstrated a widely patent greater saphenous and femoral veins. After creating a small venotomy incision, a micropuncture kit was utilized to access the LEFT greater saphenous vein. Real-time ultrasound guidance was utilized for vascular access including the acquisition of a permanent ultrasound image documenting patency of the accessed vessel. The microwire was utilized to measure appropriate catheter length. A stiff Glidewire was advanced to the level of the IVC and the micropuncture sheath was exchanged for a peel-away sheath. A palindrome tunneled hemodialysis catheter measuring 33 cm from tip to cuff was tunneled in a retrograde fashion from the lateral LEFT thigh to the venotomy incision. The catheter was then placed through the peel-away sheath with tips ultimately positioned within the infrarenal IVC. Final catheter positioning was confirmed and documented with a spot radiographic image. The catheter aspirates and flushes normally. The catheter was flushed with appropriate volume heparin dwells. The catheter exit site was secured with a 2-0 Ethilon retention suture. The venotomy incision was closed with Dermabond. Dressings were  applied. The patient tolerated the procedure well without immediate post procedural complication. IMPRESSION: 1. Occluded superior vena cava, as demonstrated by RIGHT and LEFT jugular venography. 2. Successful placement of a 33 cm tip to cuff tunneled hemodialysis catheter via the LEFT femoral vein. The tip of the catheter is positioned infrarenal IVC. The catheter is ready for immediate use. RECOMMENDATIONS: Challenging vascular access patient. DO NOT REMOVE THE PATIENT'S HEMODIALYSIS CATHETER without consulting vascular and Interventional Radiology (VIR). Roanna Banning, MD Vascular and Interventional Radiology Specialists [facility] Radiology Electronically Signed   By: Roanna Banning M.D.   On: 07/16/2023 13:43   MR ANGIO HEAD WO CONTRAST  Result Date: 07/15/2023 CLINICAL DATA:  Septic emboli EXAM: MRA HEAD WITHOUT CONTRAST TECHNIQUE: Angiographic images of the Circle of Willis were acquired using MRA technique without intravenous contrast. COMPARISON:  No prior MRA available, correlation is made with MRI head 07/15/2023 FINDINGS: Anterior circulation: Both internal carotid arteries are patent to the termini, without significant stenosis. A1 segments patent. Normal anterior communicating artery. Anterior cerebral arteries are patent to their distal aspects without significant stenosis. No M1 stenosis or occlusion. Poor signal in a long segment of a right M2 branch (series 14, images [id number]). MCA branches are otherwise perfused to their distal aspects without significant stenosis, although there is overall decreased flow signal in the right frontal lobe in the area of infarction noted on the same-day MRI. Posterior circulation: Vertebral arteries patent to the vertebrobasilar junction without stenosis. Posterior inferior cerebral arteries patent bilaterally. Basilar patent to its distal aspect. Superior cerebellar arteries patent proximally. Patent P1 segments. PCAs perfused to their distal aspects without  significant stenosis. The bilateral posterior communicating arteries are patent. Anatomic variants: None significant IMPRESSION: 1. Poor signal in a  long segment of a right M2 branch, which is favored to be artifactual, but could represent a true stenosis or occlusion. 2. No other significant intracranial stenosis. Electronically Signed   By: Wiliam Ke M.D.   On: 07/15/2023 21:46   MR BRAIN WO CONTRAST  Result Date: 07/15/2023 CLINICAL DATA:  Neuro deficit with acute stroke suspected. Altered mental status. Cervical radiculopathy. EXAM: MRI HEAD WITHOUT CONTRAST MRI CERVICAL SPINE WITHOUT CONTRAST TECHNIQUE: Multiplanar, multiecho pulse sequences of the brain and surrounding structures, and cervical spine, to include the craniocervical junction and cervicothoracic junction, were obtained without intravenous contrast. COMPARISON:  Head and cervical spine CT 08/08/2021. Head CT 07/09/2023 FINDINGS: MRI HEAD FINDINGS Brain: Sizable infarct in the right frontal lobe and insula extending into the parietal lobe, upper division MCA territory. Small acute infarcts seen in the left occipital lobe and bilateral cerebellum. ADC map correlation is limited due to the to the degree of motion artifact. Petechial hemorrhage by gradient imaging and T1 hyperintensity at the level of the confluent right cerebral infarct. No hematoma, hydrocephalus, or shift. Vascular: Grossly preserved flow voids Skull and upper cervical spine: Normal marrow signal Sinuses/Orbits: Opacified left maxillary sinus, new from 2022 and also seen on most recent head CT. MRI CERVICAL SPINE FINDINGS Alignment: Normal Vertebrae: Diffusely hypointense marrow on T1 weighted imaging, likely related to patient's history of end-stage renal disease with anemia. No focal lesion is seen. On STIR imaging no detected marrow edema to imply infection or fracture Cord: Normal shape and volume by sagittal T1 weighted imaging; nondiagnostic assessment on the other  sequences. Posterior Fossa, vertebral arteries, paraspinal tissues: Prevertebral edema/fluid without visible source. There is muscular and subcutaneous edema which is generalized and could be from volume overload. Disc levels: No visible herniation or impingement These results will be called to the ordering clinician or representative by the Radiologist Assistant, and communication documented in the PACS or [employer]. IMPRESSION: Brain MRI: 1. 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 Small acute infarcts in the left occipital lobe and bilateral cerebellum. Petechial hemorrhage is present without hematoma. No worrisome mass effect. 2. Significant motion artifact. Cervical MRI: 1. Marked motion artifact with most images being nondiagnostic. 2. Prevertebral edema/fluid but no visible spinal source or drainable collection. Electronically Signed   By: Tiburcio Pea M.D.   On: 07/15/2023 12:35   MR CERVICAL SPINE WO CONTRAST  Result Date: 07/15/2023 CLINICAL DATA:  Neuro deficit with acute stroke suspected. Altered mental status. Cervical radiculopathy. EXAM: MRI HEAD WITHOUT CONTRAST MRI CERVICAL SPINE WITHOUT CONTRAST TECHNIQUE: Multiplanar, multiecho pulse sequences of the brain and surrounding structures, and cervical spine, to include the craniocervical junction and cervicothoracic junction, were obtained without intravenous contrast. COMPARISON:  Head and cervical spine CT 08/08/2021. Head CT 07/09/2023 FINDINGS: MRI HEAD FINDINGS Brain: Sizable infarct in the right frontal lobe and insula extending into the parietal lobe, upper division MCA territory. Small acute infarcts seen in the left occipital lobe and bilateral cerebellum. ADC map correlation is limited due to the to the degree of motion artifact. Petechial hemorrhage by gradient imaging and T1 hyperintensity at the level of the confluent right cerebral infarct. No hematoma, hydrocephalus, or shift. Vascular: Grossly  preserved flow voids Skull and upper cervical spine: Normal marrow signal Sinuses/Orbits: Opacified left maxillary sinus, new from 2022 and also seen on most recent head CT. MRI CERVICAL SPINE FINDINGS Alignment: Normal Vertebrae: Diffusely hypointense marrow on T1 weighted imaging, likely related to patient's history of end-stage renal disease  with anemia. No focal lesion is seen. On STIR imaging no detected marrow edema to imply infection or fracture Cord: Normal shape and volume by sagittal T1 weighted imaging; nondiagnostic assessment on the other sequences. Posterior Fossa, vertebral arteries, paraspinal tissues: Prevertebral edema/fluid without visible source. There is muscular and subcutaneous edema which is generalized and could be from volume overload. Disc levels: No visible herniation or impingement These results will be called to the ordering clinician or representative by the Radiologist Assistant, and communication documented in the PACS or [employer]. IMPRESSION: Brain MRI: 1. 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 Small acute infarcts in the left occipital lobe and bilateral cerebellum. Petechial hemorrhage is present without hematoma. No worrisome mass effect. 2. Significant motion artifact. Cervical MRI: 1. Marked motion artifact with most images being nondiagnostic. 2. Prevertebral edema/fluid but no visible spinal source or drainable collection. Electronically Signed   By: Tiburcio Pea M.D.   On: 07/15/2023 12:35      Assessment/Plan:  INTERVAL HISTORY: MRI showed: Sizable right MCA branch infarct affecting the upper division. Small acute infarcts in the left occipital lobe and bilateral  Principal Problem:   Endocarditis of mitral valve Active Problems:   DKA (diabetic ketoacidosis) ([name])   Altered mental status   MRSA bacteremia   Hemodialysis catheter infection ([name])   Right elbow pain   ESRD on hemodialysis ([name])   Acute systolic CHF  (congestive heart failure) ([name])   DCM (dilated cardiomyopathy) ([name])   Coronary artery calcification seen on CAT scan   Infective endocarditis of tricuspid valve    [name] is a 42 y.o. 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 Not clear whether he did or did not receive antibiotics for 4 weeks despite leaving AMA but he is again admitted with MRSA uremia and sepsis: He has been found to have filamentous structure on the Mitral valve. TEE confirms this as a quite large vegetation but also has two vegetations on the Tricuspid valve with possible tricuspid valve perforation and AV thickening. He now has had a signficant CNS emboli with left hemplegia  #1 MRSA  bacteremia, sepsis and MV, TV and possibly AV endocarditis with septic emboli to brain  His temp HD catheter removed yesterday and a new one placed today.  I am hoping that there is not enough residual Staph aureus that was on his last catheter that his blood culture taken yesterday shortly after its removal will be positive because if it does turn positive we will have to get the new HD catheter removed as well.  NOTE HIS EMBOLIZING to CNS or elsewhere is yet another indication for valve replacement along with organism, left sided disease, peforation etc. But my understanding is he is not a candidate for surgery  In the interim we will continue on vancomycin  #2 C difficile colonization; on precautions per IP  #3 Goals of care: I am not optimistic about his chances of surviving this infection and I worry about further CNS embolic events absent CT surgery who do not want to intervene at this moment   I have personally spent 50 minutes involved in face-to-face and non-face-to-face activities for this patient on the day of the visit. Professional time spent includes the following  activities: Preparing to see the patient (review of tests), Obtaining and/or reviewing separately obtained history (admission/discharge record), Performing  a medically appropriate examination and/or evaluation , Ordering medications/tests/procedures, referring and communicating with other health care professionals, Documenting clinical information in the EMR, Independently interpreting results (not separately reported), Communicating results to the patient/family/caregiver, Counseling and educating the patient/family/caregiver and Care coordination (not separately reported).   My partner Dr. Elinor Parkinson is available over the weekend and will followup culture data.  From an ID standpoint, IF his post HD catheter cultures are negative we would plan on 6 weeks of IV vancomycin (can be given w HD) w day #1 being yesterday  Dr Thedore Mins will be with the new ID team here on Monday.    LOS: 6 days   Acey Lav 07/16/2023, 1:55 PM

## 2023-07-16 NOTE — Evaluation (Signed)
Speech Language Pathology Evaluation Patient Details Name: [name] MRN: [id number] DOB: 1980-12-12 Today's Date: 07/16/2023 Time: 4098-1191 SLP Time Calculation (min) (ACUTE ONLY): 17 min  Problem List:  Patient Active Problem List   Diagnosis Date Noted   Infective endocarditis of tricuspid valve 07/15/2023   Acute systolic CHF (congestive heart failure) ([name]) 07/14/2023   DCM (dilated cardiomyopathy) ([name]) 07/14/2023   Coronary artery calcification seen on CAT scan 07/14/2023   Endocarditis of mitral valve 07/13/2023   ESRD on hemodialysis ([name]) 07/13/2023   MRSA bacteremia 07/12/2023   Hemodialysis catheter infection ([name]) 07/12/2023   Right elbow pain 07/12/2023   Altered mental status 07/10/2023   Diabetic foot infection ([name]) 04/09/2023   Elevated LFTs 04/09/2023   Thrombocytopenia ([name]) 04/09/2023   DKA (diabetic ketoacidosis) ([name]) 08/09/2021   Cellulitis of right leg 05/19/2021   Pressure injury of skin 10/15/2020   Adjustment disorder, unspecified 09/21/2020   Malnutrition of moderate degree 09/13/2020   Charcot's joint of foot, left    Pneumonia due to COVID-19 virus 02/14/2020   Allergy, unspecified, initial encounter 07/17/2019   Diarrhea 04/24/2019   Encounter for orthopedic aftercare following surgical amputation    DM (diabetes mellitus), secondary, uncontrolled, with neurologic complications    Headache 09/23/2018   Anemia of chronic disease 08/18/2018   Unspecified protein-calorie malnutrition ([name]) 02/14/2018   Insulin dependent type 1 diabetes mellitus ([name]) 10/25/2017   Diabetic gastroparesis ([name]) 10/25/2017   Iron deficiency anemia, unspecified 10/28/2016   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   Secondary hyperparathyroidism of renal origin ([name]) 10/28/2016   HTN (hypertension) 08/09/2014   ESRD on dialysis ([name]) 03/19/2014   Metabolic acidosis 05/29/2013   Past Medical History:  Past Medical History:  Diagnosis Date    Anemia    Cellulitis and abscess of toe of left foot    Diabetes mellitus without complication ([name])    Diabetic gastroparesis ([name])    Diabetic ulcer of left midfoot associated with diabetes mellitus due to underlying condition, with necrosis of bone ([name])    Dialysis patient ([name])    ESRD (end stage renal disease) ([name])    Hypertension    Sepsis ([name])    Past Surgical History:  Past Surgical History:  Procedure Laterality Date   AMPUTATION Right 02/02/2018   Procedure: RIGHT FIFTH TOE AND METATARSAL AMPUTATION. Filetted toe flap metatarsal resection. Debridement Plantar Foot wound;  Surgeon: Park Liter, DPM;  Location: [facility] OR;  Service: Podiatry;  Laterality: Right;   AMPUTATION Left 08/20/2018   Procedure: FIFTH METATARSAL BONE BIOPSY;  Surgeon: Park Liter, DPM;  Location: [facility] OR;  Service: Podiatry;  Laterality: Left;   AMPUTATION Left 10/28/2018   Procedure: LEFT GREAT TOE AMPUTATION;  Surgeon: Park Liter, DPM;  Location: [facility] OR;  Service: Podiatry;  Laterality: Left;   AMPUTATION Left 09/16/2020   Procedure: AMPUTATION BELOW KNEE;  Surgeon: Larina Earthly, MD;  Location: [facility] OR;  Service: Vascular;  Laterality: Left;   AMPUTATION Left 10/15/2020   Procedure: AMPUTATION ABOVE KNEE;  Surgeon: Sherren Kerns, MD;  Location: [facility] OR;  Service: Vascular;  Laterality: Left;   APPLICATION OF WOUND VAC  02/02/2018   Procedure: APPLICATION OF WOUND VAC  Right Foot;  Surgeon: Park Liter, DPM;  Location: [facility] OR;  Service: Podiatry;;   APPLICATION OF WOUND VAC Left 10/28/2018   Procedure: APPLICATION OF WOUND VAC LEFT TOE;  Surgeon: Park Liter, DPM;  Location: [facility] OR;  Service: Podiatry;  Laterality: Left;  APPLICATION OF WOUND VAC Left 11/01/2018   Procedure: APPLICATION OF WOUND VAC;  Surgeon: Park Liter, DPM;  Location: [facility] OR;  Service: Podiatry;  Laterality: Left;   AV FISTULA PLACEMENT     left arm.   AV FISTULA PLACEMENT Right 12/22/2016   Procedure: INSERTION OF  ARTERIOVENOUS (AV) GORE-TEX GRAFT ARM;  Surgeon: Sherren Kerns, MD;  Location: [facility] OR;  Service: Vascular;  Laterality: Right;   AV FISTULA PLACEMENT Left 05/26/2018   Procedure: INSERTION OF  ARTERIOVENOUS (AV) GORE-TEX GRAFT LEFT ARM;  Surgeon: Nada Libman, MD;  Location: [facility];  Service: Vascular;  Laterality: Left;   EYE SURGERY     I & D EXTREMITY Right 10/31/2017   Procedure: IRRIGATION AND DEBRIDEMENT RIGHT FOOT;  Surgeon: Park Liter, DPM;  Location: [facility];  Service: Podiatry;  Laterality: Right;   I & D EXTREMITY Left 08/20/2018   Procedure: IRRIGATION AND DEBRIDEMENT EXTREMITY WITH SECONDARY WOUND CLOSUREAND APPLICATION OF WOUND VAC LEFT FOOT;  Surgeon: Park Liter, DPM;  Location: [facility] OR;  Service: Podiatry;  Laterality: Left;   I & D EXTREMITY Left 10/20/2018   Procedure: IRRIGATION AND DEBRIDEMENT LEFT FOOT  DEBRIDEMENT LATERAL FOOT WOUND;  Surgeon: Park Liter, DPM;  [facility]: [facility];  Service: Podiatry;  Laterality: Left;   I & D EXTREMITY Left 10/28/2018   Procedure: IRRIGATION AND DEBRIDEMENT LEFT TOE;  Surgeon: Park Liter, DPM;  [facility]: [facility];  Service: Podiatry;  Laterality: Left;   I & D EXTREMITY Left 09/14/2020   Procedure: IRRIGATION AND DEBRIDEMENT WRIST;  Surgeon: Betha Loa, MD;  Location: [facility] OR;  Service: Orthopedics;  Laterality: Left;   INSERTION OF DIALYSIS CATHETER     Right subclavian   IR AV DIALY SHUNT INTRO NEEDLE/INTRACATH INITIAL W/PTA/IMG RIGHT Right 02/05/2018   IR FLUORO GUIDE CV LINE LEFT  04/14/2023   IR FLUORO GUIDE CV LINE LEFT  07/16/2023   IR FLUORO GUIDE CV LINE RIGHT  01/31/2020   IR FLUORO GUIDE CV LINE RIGHT  01/30/2022   IR PTA VENOUS EXCEPT DIALYSIS CIRCUIT  01/30/2022   IR REMOVAL TUN CV CATH W/O FL  04/12/2023   IR THROMBECTOMY AV FISTULA W/THROMBOLYSIS/PTA INC/SHUNT/IMG LEFT Left 08/24/2018   IR THROMBECTOMY AV FISTULA W/THROMBOLYSIS/PTA INC/SHUNT/IMG LEFT Left 01/06/2019   IR US GUIDE VASC ACCESS LEFT  08/24/2018   IR US  GUIDE VASC ACCESS LEFT  04/14/2023   IR US GUIDE VASC ACCESS LEFT  07/16/2023   IR US GUIDE VASC ACCESS LEFT  07/16/2023   IR US GUIDE VASC ACCESS RIGHT  02/05/2018   IR US GUIDE VASC ACCESS RIGHT  01/31/2020   IR US GUIDE VASC ACCESS RIGHT  07/16/2023   IR VENO/JUGULAR LEFT  07/16/2023   IR VENO/JUGULAR RIGHT  07/16/2023   IR VENOCAVAGRAM SVC  01/30/2022   IRRIGATION AND DEBRIDEMENT FOOT Right 10/23/2018   Procedure: Irrigation and Debridement to tendon, Left Foot;  Surgeon: Park Liter, DPM;  [facility]: [facility];  Service: Podiatry;  Laterality: Right;   IRRIGATION AND DEBRIDEMENT FOOT Left 11/01/2018   Procedure: IRRIGATION AND DEBRIDEMENT PARTIAL WOUND CLOSURE LOCAL TISSUE TRANSFER AND FLAP ROTATION, LEFT FOOT;  Surgeon: Park Liter, DPM;  Location: [facility];  Service: Podiatry;  Laterality: Left;   TRANSESOPHAGEAL ECHOCARDIOGRAM (CATH LAB) N/A 07/14/2023   Procedure: TRANSESOPHAGEAL ECHOCARDIOGRAM;  Surgeon: Thomasene Ripple, DO;  [facility]: [facility] INVASIVE CV LAB;  Service: Cardiovascular;  Laterality: N/A;   TRANSMETATARSAL AMPUTATION N/A 08/18/2018  Procedure: IRRIGATION AND DEBRIDEMENT OF LEFT 5TH TOE AND TRANSMETATARSAL, WITH PARTICAL LEFT 5TH TOE AND METATARSAL AMPUTATION, BONE BIOPSY, WOUND VAC APPLICATION.;  Surgeon: Park Liter, DPM;  Location: [facility] OR;  Service: Podiatry;  Laterality: N/A;   UPPER EXTREMITY VENOGRAPHY N/A 11/16/2016   Procedure: Upper Extremity Venography - Right Central;  Surgeon: Sherren Kerns, MD;  Location: [facility] INVASIVE CV LAB;  Service: Cardiovascular;  Laterality: N/A;   UPPER EXTREMITY VENOGRAPHY N/A 05/25/2018   Procedure: UPPER EXTREMITY VENOGRAPHY - Bilateral;  Surgeon: Cephus Shelling, MD;  Location: [facility] INVASIVE CV LAB;  Service: Cardiovascular;  Laterality: N/A;   HPI:  Pt is a 42 yo male presenting 10/11 with AMS. His dialysis catheter was partially removed and he was in DKA. Admitted for septic shock from MRSA bacteremia; infective endocarditis. LUE  weakness was noted 10/17 and MRI confirmed sizable infarct in the R MCA branch as well as small acute infarcts in the L occipital lobe and bilateral cerebellum. PMH includes: ESRD, DM, gastroparesis, L AKA, HTN, recent admission in July for sepsis secondary to R diabetic foot ulcer (left AMA)   Assessment / Plan / Recommendation Clinical Impression  Pt has what is suspected to be acute cognitive changes s/p acute stroke, although no family is present and question his reliability as a historian. He can identify that his LUE is weak, but does not acknowledge any other acute phsyical or cognitive changes, and he cannot tell me how long it has been since he was able to use his LUE ("it could be three days, it could be three weeks, I don't know"). He is a little drowsy through this evaluation but his performance including command following is most significantly impacted by his reduced sustained attention. Pt was oriented x2 but showed some carryover regarding orientation to time when MD asked him later. He as a R gaze preference and although his speech is mostly intelligible, he does have some imprecise articulation. Recommend ongoing SLP f/u to maximize functional cognition.    SLP Assessment  SLP Recommendation/Assessment: Patient needs continued Speech Lanaguage Pathology Services SLP Visit Diagnosis: Cognitive communication deficit (R41.841)    Recommendations for follow up therapy are one component of a multi-disciplinary discharge planning process, led by the attending physician.  Recommendations may be updated based on patient status, additional functional criteria and insurance authorization.    Follow Up Recommendations  Acute inpatient rehab (3hours/day)    Assistance Recommended at Discharge  Frequent or constant Supervision/Assistance  Functional Status Assessment Patient has had a recent decline in their functional status and demonstrates the ability to make significant improvements in  function in a reasonable and predictable amount of time.  Frequency and Duration min 2x/week  2 weeks      SLP Evaluation Cognition  Overall Cognitive Status: No family/caregiver present to determine baseline cognitive functioning Arousal/Alertness: Awake/alert Orientation Level: Oriented to person;Oriented to place;Disoriented to time;Disoriented to situation Attention: Sustained Sustained Attention: Impaired Sustained Attention Impairment: Verbal basic;Functional basic Memory: Impaired Memory Impairment: Decreased recall of new information Awareness: Impaired Awareness Impairment: Intellectual impairment;Emergent impairment Safety/Judgment: Impaired       Comprehension  Auditory Comprehension Overall Auditory Comprehension: Impaired Commands: Impaired One Step Basic Commands: 75-100% accurate Interfering Components: Attention    Expression Expression Primary Mode of Expression: Verbal Verbal Expression Overall Verbal Expression: Appears within functional limits for tasks assessed   Oral / Motor  Oral Motor/Sensory Function Overall Oral Motor/Sensory Function: Severe impairment Facial ROM: Reduced left;Suspected CN VII (facial) dysfunction  Facial Symmetry: Abnormal symmetry left;Suspected CN VII (facial) dysfunction Facial Strength: Reduced left;Suspected CN VII (facial) dysfunction Facial Sensation: Reduced left;Suspected CN V (Trigeminal) dysfunction Lingual ROM: Within Functional Limits Lingual Symmetry: Within Functional Limits Lingual Strength: Within Functional Limits Velum: Within Functional Limits Mandible: Within Functional Limits Motor Speech Overall Motor Speech: Impaired Respiration: Within functional limits Phonation: Normal Resonance: Within functional limits Articulation: Impaired Level of Impairment: Conversation Intelligibility: Intelligible            Mahala Menghini., M.A. CCC-SLP Acute Rehabilitation Services Office [phone]  Secure chat  preferred  07/16/2023, 3:58 PM

## 2023-07-16 NOTE — Progress Notes (Signed)
PT Cancellation Note  Patient Details Name: [name] MRN: [id number] DOB: 1981/09/16   Cancelled Treatment:    Reason Eval/Treat Not Completed: Patient at procedure or test/unavailable; patient in IR for HD access.  Will follow up later as able.    Elray Mcgregor 07/16/2023, 12:35 PM Sheran Lawless, PT Acute Rehabilitation Services [phone] 07/16/2023

## 2023-07-16 NOTE — Plan of Care (Signed)

## 2023-07-16 NOTE — Progress Notes (Signed)
PT Cancellation Note  Patient Details Name: DAANISH SPACK MRN: [id number] DOB: 1981/09/02   Cancelled Treatment:    Reason Eval/Treat Not Completed: Patient at procedure or test/unavailable; noted pt back in his room, had SLP in initially then noted transport here to take to ultrasound.  Will attempt another day.   Elray Mcgregor 07/16/2023, 3:10 PM Sheran Lawless, PT Acute Rehabilitation Services Office:[phone] 07/16/2023

## 2023-07-16 NOTE — Evaluation (Signed)
Clinical/Bedside Swallow Evaluation Patient Details  Name: [name] MRN: [id number] Date of Birth: [birth year]  Today's Date: 07/16/2023 Time: SLP Start Time (ACUTE ONLY): 1422 SLP Stop Time (ACUTE ONLY): 1439 SLP Time Calculation (min) (ACUTE ONLY): 17 min  Past Medical History:  Past Medical History:  Diagnosis Date   Anemia    Cellulitis and abscess of toe of left foot    Diabetes mellitus without complication ([name])    Diabetic gastroparesis ([name])    Diabetic ulcer of left midfoot associated with diabetes mellitus due to underlying condition, with necrosis of bone ([name])    Dialysis patient ([name])    ESRD (end stage renal disease) ([name])    Hypertension    Sepsis ([name])    Past Surgical History:  Past Surgical History:  Procedure Laterality Date   AMPUTATION Right 02/02/2018   Procedure: RIGHT FIFTH TOE AND METATARSAL AMPUTATION. Filetted toe flap metatarsal resection. Debridement Plantar Foot wound;  Surgeon: Park Liter, DPM;  Location: [facility] OR;  Service: Podiatry;  Laterality: Right;   AMPUTATION Left 08/20/2018   Procedure: FIFTH METATARSAL BONE BIOPSY;  Surgeon: Park Liter, DPM;  [facility]: [facility] OR;  Service: Podiatry;  Laterality: Left;   AMPUTATION Left 10/28/2018   Procedure: LEFT GREAT TOE AMPUTATION;  Surgeon: Park Liter, DPM;  Location: [facility] OR;  Service: Podiatry;  Laterality: Left;   AMPUTATION Left 09/16/2020   Procedure: AMPUTATION BELOW KNEE;  Surgeon: Larina Earthly, MD;  Location: [facility] OR;  Service: Vascular;  Laterality: Left;   AMPUTATION Left 10/15/2020   Procedure: AMPUTATION ABOVE KNEE;  Surgeon: Sherren Kerns, MD;  Location: [facility] OR;  Service: Vascular;  Laterality: Left;   APPLICATION OF WOUND VAC  02/02/2018   Procedure: APPLICATION OF WOUND VAC  Right Foot;  Surgeon: Park Liter, DPM;  Location: [facility] OR;  Service: Podiatry;;   APPLICATION OF WOUND VAC Left 10/28/2018   Procedure: APPLICATION OF WOUND VAC LEFT TOE;  Surgeon: Park Liter,  DPM;  Location: [facility] OR;  Service: Podiatry;  Laterality: Left;   APPLICATION OF WOUND VAC Left 11/01/2018   Procedure: APPLICATION OF WOUND VAC;  Surgeon: Park Liter, DPM;  Location: [facility] OR;  Service: Podiatry;  Laterality: Left;   AV FISTULA PLACEMENT     left arm.   AV FISTULA PLACEMENT Right 12/22/2016   Procedure: INSERTION OF ARTERIOVENOUS (AV) GORE-TEX GRAFT ARM;  Surgeon: Sherren Kerns, MD;  Location: [facility] OR;  Service: Vascular;  Laterality: Right;   AV FISTULA PLACEMENT Left 05/26/2018   Procedure: INSERTION OF  ARTERIOVENOUS (AV) GORE-TEX GRAFT LEFT ARM;  Surgeon: Nada Libman, MD;  Location: [facility] OR;  Service: Vascular;  Laterality: Left;   EYE SURGERY     I & D EXTREMITY Right 10/31/2017   Procedure: IRRIGATION AND DEBRIDEMENT RIGHT FOOT;  Surgeon: Park Liter, DPM;  Location: [facility] OR;  Service: Podiatry;  Laterality: Right;   I & D EXTREMITY Left 08/20/2018   Procedure: IRRIGATION AND DEBRIDEMENT EXTREMITY WITH SECONDARY WOUND CLOSUREAND APPLICATION OF WOUND VAC LEFT FOOT;  Surgeon: Park Liter, DPM;  Location: [facility] OR;  Service: Podiatry;  Laterality: Left;   I & D EXTREMITY Left 10/20/2018   Procedure: IRRIGATION AND DEBRIDEMENT LEFT FOOT  DEBRIDEMENT LATERAL FOOT WOUND;  Surgeon: Park Liter, DPM;  Location: [facility] OR;  Service: Podiatry;  Laterality: Left;   I & D EXTREMITY Left 10/28/2018   Procedure: IRRIGATION AND DEBRIDEMENT LEFT TOE;  Surgeon: Park Liter,  DPM;  Location: [facility] OR;  Service: Podiatry;  Laterality: Left;   I & D EXTREMITY Left 09/14/2020   Procedure: IRRIGATION AND DEBRIDEMENT WRIST;  Surgeon: Betha Loa, MD;  Location: [facility] OR;  Service: Orthopedics;  Laterality: Left;   INSERTION OF DIALYSIS CATHETER     Right subclavian   IR AV DIALY SHUNT INTRO NEEDLE/INTRACATH INITIAL W/PTA/IMG RIGHT Right 02/05/2018   IR FLUORO GUIDE CV LINE LEFT  04/14/2023   IR FLUORO GUIDE CV LINE LEFT  07/16/2023   IR FLUORO GUIDE CV LINE RIGHT  01/31/2020   IR FLUORO  GUIDE CV LINE RIGHT  01/30/2022   IR PTA VENOUS EXCEPT DIALYSIS CIRCUIT  01/30/2022   IR REMOVAL TUN CV CATH W/O FL  04/12/2023   IR THROMBECTOMY AV FISTULA W/THROMBOLYSIS/PTA INC/SHUNT/IMG LEFT Left 08/24/2018   IR THROMBECTOMY AV FISTULA W/THROMBOLYSIS/PTA INC/SHUNT/IMG LEFT Left 01/06/2019   IR US GUIDE VASC ACCESS LEFT  08/24/2018   IR US GUIDE VASC ACCESS LEFT  04/14/2023   IR US GUIDE VASC ACCESS LEFT  07/16/2023   IR US GUIDE VASC ACCESS LEFT  07/16/2023   IR US GUIDE VASC ACCESS RIGHT  02/05/2018   IR US GUIDE VASC ACCESS RIGHT  01/31/2020   IR US GUIDE VASC ACCESS RIGHT  07/16/2023   IR VENO/JUGULAR LEFT  07/16/2023   IR VENO/JUGULAR RIGHT  07/16/2023   IR VENOCAVAGRAM SVC  01/30/2022   IRRIGATION AND DEBRIDEMENT FOOT Right 10/23/2018   Procedure: Irrigation and Debridement to tendon, Left Foot;  Surgeon: Park Liter, DPM;  Location: [facility] OR;  Service: Podiatry;  Laterality: Right;   IRRIGATION AND DEBRIDEMENT FOOT Left 11/01/2018   Procedure: IRRIGATION AND DEBRIDEMENT PARTIAL WOUND CLOSURE LOCAL TISSUE TRANSFER AND FLAP ROTATION, LEFT FOOT;  Surgeon: Park Liter, DPM;  Location: [facility] OR;  Service: Podiatry;  Laterality: Left;   TRANSESOPHAGEAL ECHOCARDIOGRAM (CATH LAB) N/A 07/14/2023   Procedure: TRANSESOPHAGEAL ECHOCARDIOGRAM;  Surgeon: Thomasene Ripple, DO;  [facility]: [facility] INVASIVE CV LAB;  Service: Cardiovascular;  Laterality: N/A;   TRANSMETATARSAL AMPUTATION N/A 08/18/2018   Procedure: IRRIGATION AND DEBRIDEMENT OF LEFT 5TH TOE AND TRANSMETATARSAL, WITH PARTICAL LEFT 5TH TOE AND METATARSAL AMPUTATION, BONE BIOPSY, WOUND VAC APPLICATION.;  Surgeon: Park Liter, DPM;  Location: [facility] OR;  Service: Podiatry;  Laterality: N/A;   UPPER EXTREMITY VENOGRAPHY N/A 11/16/2016   Procedure: Upper Extremity Venography - Right Central;  Surgeon: Sherren Kerns, MD;  Location: [facility] INVASIVE CV LAB;  Service: Cardiovascular;  Laterality: N/A;   UPPER EXTREMITY VENOGRAPHY N/A 05/25/2018   Procedure: UPPER  EXTREMITY VENOGRAPHY - Bilateral;  Surgeon: Cephus Shelling, MD;  Location: [facility] INVASIVE CV LAB;  Service: Cardiovascular;  Laterality: N/A;   HPI:  Pt is a 42 yo male presenting 10/11 with AMS. His dialysis catheter was partially removed and he was in DKA. Admitted for septic shock from MRSA bacteremia; infective endocarditis. LUE weakness was noted 10/17 and MRI confirmed sizable infarct in the R MCA branch as well as small acute infarcts in the L occipital lobe and bilateral cerebellum. PMH includes: ESRD, DM, gastroparesis, L AKA, HTN, recent admission in July for sepsis secondary to R diabetic foot ulcer (left AMA)    Assessment / Plan / Recommendation  Clinical Impression  Pt presents with signs of an oral dysphagia with sensory and motor impairment noted on his L side (CN VII, CN V). He does not have full labial seal, which results in anterior loss of thin liquids and purees without awareness. This  did improve with introduction of straw. While he has improved oral containment with solids, he also has L buccal pocketing. No overt s/s of aspiration were noted across consistencies tested. Recommend starting with Dys 3 diet (mechanical soft) and thin liquids. He will likely benefit from staff assist during meals. SLP Visit Diagnosis: Dysphagia, unspecified (R13.10)    Aspiration Risk  Mild aspiration risk;Moderate aspiration risk;Risk for inadequate nutrition/hydration    Diet Recommendation Dysphagia 3 (Mech soft);Thin liquid    Liquid Administration via: Cup;Straw Medication Administration: Crushed with puree Supervision: Patient able to self feed;Full supervision/cueing for compensatory strategies Compensations: Minimize environmental distractions;Slow rate;Small sips/bites;Lingual sweep for clearance of pocketing;Monitor for anterior loss Postural Changes: Seated upright at 90 degrees    Other  Recommendations Oral Care Recommendations: Oral care BID    Recommendations for follow up  therapy are one component of a multi-disciplinary discharge planning process, led by the attending physician.  Recommendations may be updated based on patient status, additional functional criteria and insurance authorization.  Follow up Recommendations Acute inpatient rehab (3hours/day)      Assistance Recommended at Discharge    Functional Status Assessment Patient has had a recent decline in their functional status and demonstrates the ability to make significant improvements in function in a reasonable and predictable amount of time.  Frequency and Duration min 2x/week  2 weeks       Prognosis Prognosis for improved oropharyngeal function: Good Barriers to Reach Goals: Cognitive deficits      Swallow Study   General HPI: Pt is a 42 yo male presenting 10/11 with AMS. His dialysis catheter was partially removed and he was in DKA. Admitted for septic shock from MRSA bacteremia; infective endocarditis. LUE weakness was noted 10/17 and MRI confirmed sizable infarct in the R MCA branch as well as small acute infarcts in the L occipital lobe and bilateral cerebellum. PMH includes: ESRD, DM, gastroparesis, L AKA, HTN, recent admission in July for sepsis secondary to R diabetic foot ulcer (left AMA) Type of Study: Bedside Swallow Evaluation Previous Swallow Assessment: none in chart Diet Prior to this Study: NPO Temperature Spikes Noted: No Respiratory Status: Room air History of Recent Intubation: No Behavior/Cognition: Alert;Requires cueing Oral Cavity Assessment: Within Functional Limits Oral Care Completed by SLP: No Oral Cavity - Dentition: Adequate natural dentition;Poor condition Vision: Functional for self-feeding Self-Feeding Abilities: Able to feed self;Needs set up Patient Positioning: Upright in bed Baseline Vocal Quality: Normal Volitional Cough: Other (Comment) (pt declined, said he did not like to cough, could not be convinced to try to cough or clear his  throat) Volitional Swallow: Able to elicit    Oral/Motor/Sensory Function Overall Oral Motor/Sensory Function: Severe impairment Facial ROM: Reduced left;Suspected CN VII (facial) dysfunction Facial Symmetry: Abnormal symmetry left;Suspected CN VII (facial) dysfunction Facial Strength: Reduced left;Suspected CN VII (facial) dysfunction Facial Sensation: Reduced left;Suspected CN V (Trigeminal) dysfunction Lingual ROM: Within Functional Limits Lingual Symmetry: Within Functional Limits Lingual Strength: Within Functional Limits Velum: Within Functional Limits Mandible: Within Functional Limits   Ice Chips Ice chips: Not tested   Thin Liquid Thin Liquid: Impaired Presentation: Cup;Self Fed;Straw Oral Phase Impairments: Reduced labial seal Oral Phase Functional Implications: Left anterior spillage    Nectar Thick Nectar Thick Liquid: Not tested   Honey Thick Honey Thick Liquid: Not tested   Puree Puree: Impaired Presentation: Self Fed;Spoon Oral Phase Impairments: Reduced labial seal Oral Phase Functional Implications: Left anterior spillage   Solid     Solid: Impaired Presentation: Self Fed  Oral Phase Impairments: Reduced labial seal Oral Phase Functional Implications: Left lateral sulci pocketing      Mahala Menghini., M.A. CCC-SLP Acute Rehabilitation Services Office [phone]  Secure chat preferred  07/16/2023,3:45 PM

## 2023-07-16 NOTE — Progress Notes (Addendum)
STROKE TEAM PROGRESS NOTE   BRIEF HPI [name] is a 42 y.o. male with history of  ESRD on HD MWF, right diabetic foot ulcer complicated by MRSA bacteremia 7/24 likely 2/2 to HD line infection, insulin dependent diabetes, gastroparesis, hx of left AKA, who presents with AMS on 10/11 and found to have septic shock with hypotension, leukocytosis and encephalopathy. He was admitted in July with MRSA bacteremia. He was treated with vancomycin, refused echo and left AMA.  Blood cultures this admission grew MRSA bacteremia. TEE showed large vegetation measuring 2 cm x 0.8 cm on the posterior mitral valve leaflet extending into the mitral valve annulus with high mobility. Also 2 mobile masses on the anterior leaflet of the tricuspid valve and a 3rd mass on the posterior tricuspid valve. He mentioned left arm weakness for some time. MRI completed and showed right MCA infarct with acute infarcts in left occipital and bilateral cerebellum   SIGNIFICANT HOSPITAL EVENTS   INTERIM HISTORY/SUBJECTIVE Speech therapist at bedside.  Patient reclining in bed, asking for food, dysarthria with left facial droop and left hemiplegia.  He does have left AKA.  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  CVTS recommend conversive management.  On antibiotics.   OBJECTIVE  CBC    Component Value Date/Time   WBC 13.7 (H) 07/15/2023 0609   RBC 3.30 (L) 07/15/2023 0609   HGB 9.3 (L) 07/15/2023 0609   HGB 9.7 (L) 08/22/2014 0447   HCT 29.3 (L) 07/15/2023 0609   HCT 30.0 (L) 08/22/2014 0447   PLT 166 07/15/2023 0609   PLT 158 08/22/2014 0447   MCV 88.8 07/15/2023 0609   MCV 97 08/22/2014 0447   MCH 28.2 07/15/2023 0609   MCHC 31.7 07/15/2023 0609   RDW 14.6 07/15/2023 0609   RDW 15.3 (H) 08/22/2014 0447   LYMPHSABS 0.3 (L) 07/09/2023 2231   LYMPHSABS 1.7 08/22/2014 0447   MONOABS 0.9 07/09/2023 2231   MONOABS 0.8 08/22/2014 0447   EOSABS 0.1 07/09/2023 2231   EOSABS 0.4 08/22/2014 0447   BASOSABS  0.1 07/09/2023 2231   BASOSABS 0.1 08/22/2014 0447    BMET    Component Value Date/Time   NA 131 (L) 07/15/2023 0609   NA 138 08/22/2014 0447   K 4.1 07/15/2023 0609   K 4.0 08/22/2014 0447   CL 89 (L) 07/15/2023 0609   CL 100 08/22/2014 0447   CO2 14 (L) 07/15/2023 0609   CO2 30 08/22/2014 0447   GLUCOSE 332 (H) 07/15/2023 0609   GLUCOSE 88 08/22/2014 0447   BUN 49 (H) 07/15/2023 0609   BUN 14 08/22/2014 0447   CREATININE 8.54 (H) 07/15/2023 0609   CREATININE 5.00 (H) 08/22/2014 0447   CALCIUM 8.3 (L) 07/15/2023 0609   CALCIUM 8.2 (L) 08/22/2014 0447   GFRNONAA 7 (L) 07/15/2023 0609   GFRNONAA 14 (L) 08/22/2014 0447   GFRNONAA 5 (L) 06/19/2014 0937    IMAGING past 24 hours IR Fluoro Guide CV Line Left  Result Date: 07/16/2023 INDICATION: ESRD on HD.  Challenging vascular access. EXAM: Title: TUNNELED CENTRAL VENOUS HEMODIALYSIS CATHETER PLACEMENT WITH ULTRASOUND AND FLUOROSCOPIC GUIDANCE Procedures: 1. ULTRASOUND-GUIDED RIGHT JUGULAR, LEFT JUGULAR AND LEFT FEMORAL VENOUS ACCESS 2. RIGHT JUGULAR VENOGRAPHY 3. LEFT JUGULAR VENOGRAPHY 4. LEFT FEMORAL TUNNELED HEMODIALYSIS CATHETER PLACEMENT MEDICATIONS: Ancef 2 gm IV . The antibiotic was given in an appropriate time interval prior to skin puncture. ANESTHESIA/SEDATION: Moderate (conscious) sedation was employed during this procedure. A total of Versed 1 mg and  Fentanyl 50 mcg was administered intravenously. Moderate Sedation Time: 54 minutes. The patient's level of consciousness and vital signs were monitored continuously by radiology nursing throughout the procedure under my direct supervision. FLUOROSCOPY TIME:  Fluoroscopic dose; 61 mGy COMPLICATIONS: None immediate. PROCEDURE: Informed written consent was obtained from the patient and/or patient's representative after a discussion of the risks, benefits, and alternatives to treatment. Questions regarding the procedure were encouraged and answered. The operative sites were prepped  with chlorhexidine in a sterile fashion, and a sterile drape was applied covering the operative field. Maximum barrier sterile technique with sterile gowns and gloves were used for the procedure. A timeout was performed prior to the initiation of the procedure. Initial access was obtained at the RIGHT neck, however the microwire would not advance. RIGHT jugular venography was then performed. The RIGHT neck access site was aborted. Access was then obtained at the LEFT neck, however the microwire would not advance into the SVC. LEFT jugular venography was also performed. The LEFT neck access site was also aborted. Sonographic evaluation of the LEFT groin demonstrated a widely patent greater saphenous and femoral veins. After creating a small venotomy incision, a micropuncture kit was utilized to access the LEFT greater saphenous vein. Real-time ultrasound guidance was utilized for vascular access including the acquisition of a permanent ultrasound image documenting patency of the accessed vessel. The microwire was utilized to measure appropriate catheter length. A stiff Glidewire was advanced to the level of the IVC and the micropuncture sheath was exchanged for a peel-away sheath. A palindrome tunneled hemodialysis catheter measuring 33 cm from tip to cuff was tunneled in a retrograde fashion from the lateral LEFT thigh to the venotomy incision. The catheter was then placed through the peel-away sheath with tips ultimately positioned within the infrarenal IVC. Final catheter positioning was confirmed and documented with a spot radiographic image. The catheter aspirates and flushes normally. The catheter was flushed with appropriate volume heparin dwells. The catheter exit site was secured with a 2-0 Ethilon retention suture. The venotomy incision was closed with Dermabond. Dressings were applied. The patient tolerated the procedure well without immediate post procedural complication. IMPRESSION: 1. Occluded superior  vena cava, as demonstrated by RIGHT and LEFT jugular venography. 2. Successful placement of a 33 cm tip to cuff tunneled hemodialysis catheter via the LEFT femoral vein. The tip of the catheter is positioned infrarenal IVC. The catheter is ready for immediate use. RECOMMENDATIONS: Challenging vascular access patient. DO NOT REMOVE THE PATIENT'S HEMODIALYSIS CATHETER without consulting vascular and Interventional Radiology (VIR). Roanna Banning, MD [facility] Interventional [facility]iology Specialists [facility] Radiology Electronically Signed   By: Roanna Banning M.D.   On: 07/16/2023 13:43   IR US Guide Vasc Access Left  Result Date: 07/16/2023 INDICATION: ESRD on HD.  Challenging vascular access. EXAM: Title: TUNNELED CENTRAL VENOUS HEMODIALYSIS CATHETER PLACEMENT WITH ULTRASOUND AND FLUOROSCOPIC GUIDANCE Procedures: 1. ULTRASOUND-GUIDED RIGHT JUGULAR, LEFT JUGULAR AND LEFT FEMORAL VENOUS ACCESS 2. RIGHT JUGULAR VENOGRAPHY 3. LEFT JUGULAR VENOGRAPHY 4. LEFT FEMORAL TUNNELED HEMODIALYSIS CATHETER PLACEMENT MEDICATIONS: Ancef 2 gm IV . The antibiotic was given in an appropriate time interval prior to skin puncture. ANESTHESIA/SEDATION: Moderate (conscious) sedation was employed during this procedure. A total of Versed 1 mg and Fentanyl 50 mcg was administered intravenously. Moderate Sedation Time: 54 minutes. The patient's level of consciousness and vital signs were monitored continuously by radiology nursing throughout the procedure under my direct supervision. FLUOROSCOPY TIME:  Fluoroscopic dose; 61 mGy COMPLICATIONS: None immediate. PROCEDURE: Informed written consent  was obtained from the patient and/or patient's representative after a discussion of the risks, benefits, and alternatives to treatment. Questions regarding the procedure were encouraged and answered. The operative sites were prepped with chlorhexidine in a sterile fashion, and a sterile drape was applied covering the operative field. Maximum barrier  sterile technique with sterile gowns and gloves were used for the procedure. A timeout was performed prior to the initiation of the procedure. Initial access was obtained at the RIGHT neck, however the microwire would not advance. RIGHT jugular venography was then performed. The RIGHT neck access site was aborted. Access was then obtained at the LEFT neck, however the microwire would not advance into the SVC. LEFT jugular venography was also performed. The LEFT neck access site was also aborted. Sonographic evaluation of the LEFT groin demonstrated a widely patent greater saphenous and femoral veins. After creating a small venotomy incision, a micropuncture kit was utilized to access the LEFT greater saphenous vein. Real-time ultrasound guidance was utilized for vascular access including the acquisition of a permanent ultrasound image documenting patency of the accessed vessel. The microwire was utilized to measure appropriate catheter length. A stiff Glidewire was advanced to the level of the IVC and the micropuncture sheath was exchanged for a peel-away sheath. A palindrome tunneled hemodialysis catheter measuring 33 cm from tip to cuff was tunneled in a retrograde fashion from the lateral LEFT thigh to the venotomy incision. The catheter was then placed through the peel-away sheath with tips ultimately positioned within the infrarenal IVC. Final catheter positioning was confirmed and documented with a spot radiographic image. The catheter aspirates and flushes normally. The catheter was flushed with appropriate volume heparin dwells. The catheter exit site was secured with a 2-0 Ethilon retention suture. The venotomy incision was closed with Dermabond. Dressings were applied. The patient tolerated the procedure well without immediate post procedural complication. IMPRESSION: 1. Occluded superior vena cava, as demonstrated by RIGHT and LEFT jugular venography. 2. Successful placement of a 33 cm tip to cuff  tunneled hemodialysis catheter via the LEFT femoral vein. The tip of the catheter is positioned infrarenal IVC. The catheter is ready for immediate use. RECOMMENDATIONS: Challenging vascular access patient. DO NOT REMOVE THE PATIENT'S HEMODIALYSIS CATHETER without consulting vascular and Interventional Radiology (VIR). Roanna Banning, MD Vascular and Interventional Radiology Specialists [facility] Radiology Electronically Signed   By: Roanna Banning M.D.   On: 07/16/2023 13:43   IR Veno/Jugular Right  Result Date: 07/16/2023 INDICATION: ESRD on HD.  Challenging vascular access. EXAM: Title: TUNNELED CENTRAL VENOUS HEMODIALYSIS CATHETER PLACEMENT WITH ULTRASOUND AND FLUOROSCOPIC GUIDANCE Procedures: 1. ULTRASOUND-GUIDED RIGHT JUGULAR, LEFT JUGULAR AND LEFT FEMORAL VENOUS ACCESS 2. RIGHT JUGULAR VENOGRAPHY 3. LEFT JUGULAR VENOGRAPHY 4. LEFT FEMORAL TUNNELED HEMODIALYSIS CATHETER PLACEMENT MEDICATIONS: Ancef 2 gm IV . The antibiotic was given in an appropriate time interval prior to skin puncture. ANESTHESIA/SEDATION: Moderate (conscious) sedation was employed during this procedure. A total of Versed 1 mg and Fentanyl 50 mcg was administered intravenously. Moderate Sedation Time: 54 minutes. The patient's level of consciousness and vital signs were monitored continuously by radiology nursing throughout the procedure under my direct supervision. FLUOROSCOPY TIME:  Fluoroscopic dose; 61 mGy COMPLICATIONS: None immediate. PROCEDURE: Informed written consent was obtained from the patient and/or patient's representative after a discussion of the risks, benefits, and alternatives to treatment. Questions regarding the procedure were encouraged and answered. The operative sites were prepped with chlorhexidine in a sterile fashion, and a sterile drape was applied covering the operative field. Maximum  barrier sterile technique with sterile gowns and gloves were used for the procedure. A timeout was performed prior to the  initiation of the procedure. Initial access was obtained at the RIGHT neck, however the microwire would not advance. RIGHT jugular venography was then performed. The RIGHT neck access site was aborted. Access was then obtained at the LEFT neck, however the microwire would not advance into the SVC. LEFT jugular venography was also performed. The LEFT neck access site was also aborted. Sonographic evaluation of the LEFT groin demonstrated a widely patent greater saphenous and femoral veins. After creating a small venotomy incision, a micropuncture kit was utilized to access the LEFT greater saphenous vein. Real-time ultrasound guidance was utilized for vascular access including the acquisition of a permanent ultrasound image documenting patency of the accessed vessel. The microwire was utilized to measure appropriate catheter length. A stiff Glidewire was advanced to the level of the IVC and the micropuncture sheath was exchanged for a peel-away sheath. A palindrome tunneled hemodialysis catheter measuring 33 cm from tip to cuff was tunneled in a retrograde fashion from the lateral LEFT thigh to the venotomy incision. The catheter was then placed through the peel-away sheath with tips ultimately positioned within the infrarenal IVC. Final catheter positioning was confirmed and documented with a spot radiographic image. The catheter aspirates and flushes normally. The catheter was flushed with appropriate volume heparin dwells. The catheter exit site was secured with a 2-0 Ethilon retention suture. The venotomy incision was closed with Dermabond. Dressings were applied. The patient tolerated the procedure well without immediate post procedural complication. IMPRESSION: 1. Occluded superior vena cava, as demonstrated by RIGHT and LEFT jugular venography. 2. Successful placement of a 33 cm tip to cuff tunneled hemodialysis catheter via the LEFT femoral vein. The tip of the catheter is positioned infrarenal IVC. The  catheter is ready for immediate use. RECOMMENDATIONS: Challenging vascular access patient. DO NOT REMOVE THE PATIENT'S HEMODIALYSIS CATHETER without consulting vascular and Interventional Radiology (VIR). Roanna Banning, MD Vascular and Interventional Radiology Specialists [facility] Radiology Electronically Signed   By: Roanna Banning M.D.   On: 07/16/2023 13:43   IR Veno/Jugular Left  Result Date: 07/16/2023 INDICATION: ESRD on HD.  Challenging vascular access. EXAM: Title: TUNNELED CENTRAL VENOUS HEMODIALYSIS CATHETER PLACEMENT WITH ULTRASOUND AND FLUOROSCOPIC GUIDANCE Procedures: 1. ULTRASOUND-GUIDED RIGHT JUGULAR, LEFT JUGULAR AND LEFT FEMORAL VENOUS ACCESS 2. RIGHT JUGULAR VENOGRAPHY 3. LEFT JUGULAR VENOGRAPHY 4. LEFT FEMORAL TUNNELED HEMODIALYSIS CATHETER PLACEMENT MEDICATIONS: Ancef 2 gm IV . The antibiotic was given in an appropriate time interval prior to skin puncture. ANESTHESIA/SEDATION: Moderate (conscious) sedation was employed during this procedure. A total of Versed 1 mg and Fentanyl 50 mcg was administered intravenously. Moderate Sedation Time: 54 minutes. The patient's level of consciousness and vital signs were monitored continuously by radiology nursing throughout the procedure under my direct supervision. FLUOROSCOPY TIME:  Fluoroscopic dose; 61 mGy COMPLICATIONS: None immediate. PROCEDURE: Informed written consent was obtained from the patient and/or patient's representative after a discussion of the risks, benefits, and alternatives to treatment. Questions regarding the procedure were encouraged and answered. The operative sites were prepped with chlorhexidine in a sterile fashion, and a sterile drape was applied covering the operative field. Maximum barrier sterile technique with sterile gowns and gloves were used for the procedure. A timeout was performed prior to the initiation of the procedure. Initial access was obtained at the RIGHT neck, however the microwire would not advance. RIGHT  jugular venography was then performed. The RIGHT neck access  site was aborted. Access was then obtained at the LEFT neck, however the microwire would not advance into the SVC. LEFT jugular venography was also performed. The LEFT neck access site was also aborted. Sonographic evaluation of the LEFT groin demonstrated a widely patent greater saphenous and femoral veins. After creating a small venotomy incision, a micropuncture kit was utilized to access the LEFT greater saphenous vein. Real-time ultrasound guidance was utilized for vascular access including the acquisition of a permanent ultrasound image documenting patency of the accessed vessel. The microwire was utilized to measure appropriate catheter length. A stiff Glidewire was advanced to the level of the IVC and the micropuncture sheath was exchanged for a peel-away sheath. A palindrome tunneled hemodialysis catheter measuring 33 cm from tip to cuff was tunneled in a retrograde fashion from the lateral LEFT thigh to the venotomy incision. The catheter was then placed through the peel-away sheath with tips ultimately positioned within the infrarenal IVC. Final catheter positioning was confirmed and documented with a spot radiographic image. The catheter aspirates and flushes normally. The catheter was flushed with appropriate volume heparin dwells. The catheter exit site was secured with a 2-0 Ethilon retention suture. The venotomy incision was closed with Dermabond. Dressings were applied. The patient tolerated the procedure well without immediate post procedural complication. IMPRESSION: 1. Occluded superior vena cava, as demonstrated by RIGHT and LEFT jugular venography. 2. Successful placement of a 33 cm tip to cuff tunneled hemodialysis catheter via the LEFT femoral vein. The tip of the catheter is positioned infrarenal IVC. The catheter is ready for immediate use. RECOMMENDATIONS: Challenging vascular access patient. DO NOT REMOVE THE PATIENT'S  HEMODIALYSIS CATHETER without consulting vascular and Interventional Radiology (VIR). Roanna Banning, MD Vascular and Interventional Radiology Specialists [facility] Radiology Electronically Signed   By: Roanna Banning M.D.   On: 07/16/2023 13:43   IR US Guide Vasc Access Right  Result Date: 07/16/2023 INDICATION: ESRD on HD.  Challenging vascular access. EXAM: Title: TUNNELED CENTRAL VENOUS HEMODIALYSIS CATHETER PLACEMENT WITH ULTRASOUND AND FLUOROSCOPIC GUIDANCE Procedures: 1. ULTRASOUND-GUIDED RIGHT JUGULAR, LEFT JUGULAR AND LEFT FEMORAL VENOUS ACCESS 2. RIGHT JUGULAR VENOGRAPHY 3. LEFT JUGULAR VENOGRAPHY 4. LEFT FEMORAL TUNNELED HEMODIALYSIS CATHETER PLACEMENT MEDICATIONS: Ancef 2 gm IV . The antibiotic was given in an appropriate time interval prior to skin puncture. ANESTHESIA/SEDATION: Moderate (conscious) sedation was employed during this procedure. A total of Versed 1 mg and Fentanyl 50 mcg was administered intravenously. Moderate Sedation Time: 54 minutes. The patient's level of consciousness and vital signs were monitored continuously by radiology nursing throughout the procedure under my direct supervision. FLUOROSCOPY TIME:  Fluoroscopic dose; 61 mGy COMPLICATIONS: None immediate. PROCEDURE: Informed written consent was obtained from the patient and/or patient's representative after a discussion of the risks, benefits, and alternatives to treatment. Questions regarding the procedure were encouraged and answered. The operative sites were prepped with chlorhexidine in a sterile fashion, and a sterile drape was applied covering the operative field. Maximum barrier sterile technique with sterile gowns and gloves were used for the procedure. A timeout was performed prior to the initiation of the procedure. Initial access was obtained at the RIGHT neck, however the microwire would not advance. RIGHT jugular venography was then performed. The RIGHT neck access site was aborted. Access was then obtained at the  LEFT neck, however the microwire would not advance into the SVC. LEFT jugular venography was also performed. The LEFT neck access site was also aborted. Sonographic evaluation of the LEFT groin demonstrated a widely patent greater saphenous  and femoral veins. After creating a small venotomy incision, a micropuncture kit was utilized to access the LEFT greater saphenous vein. Real-time ultrasound guidance was utilized for vascular access including the acquisition of a permanent ultrasound image documenting patency of the accessed vessel. The microwire was utilized to measure appropriate catheter length. A stiff Glidewire was advanced to the level of the IVC and the micropuncture sheath was exchanged for a peel-away sheath. A palindrome tunneled hemodialysis catheter measuring 33 cm from tip to cuff was tunneled in a retrograde fashion from the lateral LEFT thigh to the venotomy incision. The catheter was then placed through the peel-away sheath with tips ultimately positioned within the infrarenal IVC. Final catheter positioning was confirmed and documented with a spot radiographic image. The catheter aspirates and flushes normally. The catheter was flushed with appropriate volume heparin dwells. The catheter exit site was secured with a 2-0 Ethilon retention suture. The venotomy incision was closed with Dermabond. Dressings were applied. The patient tolerated the procedure well without immediate post procedural complication. IMPRESSION: 1. Occluded superior vena cava, as demonstrated by RIGHT and LEFT jugular venography. 2. Successful placement of a 33 cm tip to cuff tunneled hemodialysis catheter via the LEFT femoral vein. The tip of the catheter is positioned infrarenal IVC. The catheter is ready for immediate use. RECOMMENDATIONS: Challenging vascular access patient. DO NOT REMOVE THE PATIENT'S HEMODIALYSIS CATHETER without consulting vascular and Interventional Radiology (VIR). Roanna Banning, MD Vascular and  Interventional Radiology Specialists [facility] Radiology Electronically Signed   By: Roanna Banning M.D.   On: 07/16/2023 13:43   IR US Guide Vasc Access Left  Result Date: 07/16/2023 INDICATION: ESRD on HD.  Challenging vascular access. EXAM: Title: TUNNELED CENTRAL VENOUS HEMODIALYSIS CATHETER PLACEMENT WITH ULTRASOUND AND FLUOROSCOPIC GUIDANCE Procedures: 1. ULTRASOUND-GUIDED RIGHT JUGULAR, LEFT JUGULAR AND LEFT FEMORAL VENOUS ACCESS 2. RIGHT JUGULAR VENOGRAPHY 3. LEFT JUGULAR VENOGRAPHY 4. LEFT FEMORAL TUNNELED HEMODIALYSIS CATHETER PLACEMENT MEDICATIONS: Ancef 2 gm IV . The antibiotic was given in an appropriate time interval prior to skin puncture. ANESTHESIA/SEDATION: Moderate (conscious) sedation was employed during this procedure. A total of Versed 1 mg and Fentanyl 50 mcg was administered intravenously. Moderate Sedation Time: 54 minutes. The patient's level of consciousness and vital signs were monitored continuously by radiology nursing throughout the procedure under my direct supervision. FLUOROSCOPY TIME:  Fluoroscopic dose; 61 mGy COMPLICATIONS: None immediate. PROCEDURE: Informed written consent was obtained from the patient and/or patient's representative after a discussion of the risks, benefits, and alternatives to treatment. Questions regarding the procedure were encouraged and answered. The operative sites were prepped with chlorhexidine in a sterile fashion, and a sterile drape was applied covering the operative field. Maximum barrier sterile technique with sterile gowns and gloves were used for the procedure. A timeout was performed prior to the initiation of the procedure. Initial access was obtained at the RIGHT neck, however the microwire would not advance. RIGHT jugular venography was then performed. The RIGHT neck access site was aborted. Access was then obtained at the LEFT neck, however the microwire would not advance into the SVC. LEFT jugular venography was also performed. The  LEFT neck access site was also aborted. Sonographic evaluation of the LEFT groin demonstrated a widely patent greater saphenous and femoral veins. After creating a small venotomy incision, a micropuncture kit was utilized to access the LEFT greater saphenous vein. Real-time ultrasound guidance was utilized for vascular access including the acquisition of a permanent ultrasound image documenting patency of the accessed vessel. The microwire was  utilized to measure appropriate catheter length. A stiff Glidewire was advanced to the level of the IVC and the micropuncture sheath was exchanged for a peel-away sheath. A palindrome tunneled hemodialysis catheter measuring 33 cm from tip to cuff was tunneled in a retrograde fashion from the lateral LEFT thigh to the venotomy incision. The catheter was then placed through the peel-away sheath with tips ultimately positioned within the infrarenal IVC. Final catheter positioning was confirmed and documented with a spot radiographic image. The catheter aspirates and flushes normally. The catheter was flushed with appropriate volume heparin dwells. The catheter exit site was secured with a 2-0 Ethilon retention suture. The venotomy incision was closed with Dermabond. Dressings were applied. The patient tolerated the procedure well without immediate post procedural complication. IMPRESSION: 1. Occluded superior vena cava, as demonstrated by RIGHT and LEFT jugular venography. 2. Successful placement of a 33 cm tip to cuff tunneled hemodialysis catheter via the LEFT femoral vein. The tip of the catheter is positioned infrarenal IVC. The catheter is ready for immediate use. RECOMMENDATIONS: Challenging vascular access patient. DO NOT REMOVE THE PATIENT'S HEMODIALYSIS CATHETER without consulting vascular and Interventional Radiology (VIR). Roanna Banning, MD Vascular and Interventional Radiology Specialists [facility] Radiology Electronically Signed   By: Roanna Banning M.D.   On:  07/16/2023 13:43   MR ANGIO HEAD WO CONTRAST  Result Date: 07/15/2023 CLINICAL DATA:  Septic emboli EXAM: MRA HEAD WITHOUT CONTRAST TECHNIQUE: Angiographic images of the Circle of Willis were acquired using MRA technique without intravenous contrast. COMPARISON:  No prior MRA available, correlation is made with MRI head 07/15/2023 FINDINGS: Anterior circulation: Both internal carotid arteries are patent to the termini, without significant stenosis. A1 segments patent. Normal anterior communicating artery. Anterior cerebral arteries are patent to their distal aspects without significant stenosis. No M1 stenosis or occlusion. Poor signal in a long segment of a right M2 branch (series 14, images 103-142). MCA branches are otherwise perfused to their distal aspects without significant stenosis, although there is overall decreased flow signal in the right frontal lobe in the area of infarction noted on the same-day MRI. Posterior circulation: Vertebral arteries patent to the vertebrobasilar junction without stenosis. Posterior inferior cerebral arteries patent bilaterally. Basilar patent to its distal aspect. Superior cerebellar arteries patent proximally. Patent P1 segments. PCAs perfused to their distal aspects without significant stenosis. The bilateral posterior communicating arteries are patent. Anatomic variants: None significant IMPRESSION: 1. Poor signal in a long segment of a right M2 branch, which is favored to be artifactual, but could represent a true stenosis or occlusion. 2. No other significant intracranial stenosis. Electronically Signed   By: Wiliam Ke M.D.   On: 07/15/2023 21:46    Vitals:   07/16/23 1250 07/16/23 1255 07/16/23 1310 07/16/23 1420  BP: (!) 129/55 (!) 126/54 (!) 106/59 (!) 124/52  Pulse: (!) 106 (!) 106 (!) 110 (!) 102  Resp: 12 13 17 18   Temp:    98.5 F (36.9 C)  TempSrc:    Axillary  SpO2: 94% 100% 96% 97%  Weight:         PHYSICAL EXAM Constitutional: appears  older than stated age, sleepy Cardiovascular: Normal rate and regular rhythm.  Respiratory: Effort normal, non-labored breathing.  GI: Soft.  No distension. There is no tenderness.  Skin: abrasions present on right lower extremity   NEURO:  Mental Status: Patient is awake, oriented x3 Poor attention and concentration Speech is clear.  Cranial Nerves: II: Pupils equal, round, and reactive to light.  III,IV, VI: EOMI without ptosis or diploplia.  V: Facial sensation is symmetric to light touch. VII:  Right sided facial droop VIII: Hearing is intact to voice X: Uvula elevates symmetrically XI: Shoulder shrug is asymmetric.unable to move left shoulder XII: Tongue is midline without atrophy or fasciculations.  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 Cerebellar: Poor attention span, no ataxia noted. Unable to complete on the left  ASSESSMENT/PLAN  Stroke: Large right MCA infarct and small left occipital lobe and bilateral cerebellum infarcts, etiology:  embolic in the setting of endocarditis   CT head No acute abnormality, motion degraded   MRI  Sizable right MCA branch infarct affecting the upper division. Small acute infarcts in the left occipital lobe and bilateral cerebellum. Petechial hemorrhage is present without hematoma. No worrisome mass effect. MRA  Poor signal in a long segment of a right M2 branch, which is favored to be artifactual, but could represent a true stenosis or occlusion Carotid Doppler unremarkable 2D Echo EF 25-30% abnormal mitral valve TEE: EF 30-35%. Large mass on posterior mitral leaflet.  2 mobile masses on the anterior leaflet of the tricuspid valve with perforation.  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 Large mass on posterior mitral leaflet.  2 mobile masses on the anterior leaflet of the tricuspid valve with perforation.  Another mobile mass on the posterior tricuspid leaflet Management per primary team and cardiology.  CT surgery recommends conservative management with antibiotics On vancomycin and Ancef  MRSA bacteremia Line holiday completed ID following  Abx: Vancomycin and Ancef  ESRD on HD MWF HD cath replaced today  Nephrology following  Congestive Heart Failure Hypotensive BP goal is normotension/per cardiology  SBP currently 90-110  during hospitalization  Diabetes type II Uncontrolled Home meds:  Novolin HgbA1c 8.9, goal < 7.0 CBGs SSI Recommend close follow-up with PCP for better DM control  Dysphagia Patient has post-stroke dysphagia, SLP consulted Passed swallow and on dys 3 diet and thin liquid Advance diet as tolerated  Other acute issues Hyponatremia, sodium 131 Leukocytosis WBC 20.5--15.3--13.7   Hospital day # 6  Neurology has no more neurorecommendations, will sign off. Please call with questions. Pt will follow up with stroke clinic NP at [facility] in about 4 weeks after discharge. Thanks for the consult.  Marvel Plan, MD PhD Stroke Neurology 07/16/2023 8:12 PM   To contact Stroke Continuity provider, please refer to [email or web address]. After hours, contact General Neurology

## 2023-07-16 NOTE — Plan of Care (Signed)

## 2023-07-16 NOTE — Procedures (Signed)
Vascular and Interventional Radiology Procedure Note  Patient: [name] DOB: 03/21/1981 Medical Record Number: [id number] Note Date/Time: 07/16/23 1:19 PM   Performing Physician: Roanna Banning, MD Assistant(s): None  Diagnosis: Poor IV access and ESRD requiring Hemodialysis  Procedure: TUNNELED HEMODIALYSIS CATHETER PLACEMENT  Anesthesia: Conscious Sedation Complications: None Estimated Blood Loss: Minimal Specimens:  None  Findings:  Occluded BOTH R and L SVC on venography. Successful placement of LEFT, femoral-approach,  33 cm  (tip-to-cuff), tunneled hemodialysis catheter with the tip of the catheter in the infra-renal IVC.  Plan: Catheter ready for use.  See detailed procedure note with images in PACS. The patient tolerated the procedure well without incident or complication and was returned to Recovery in stable condition.    Roanna Banning, MD Vascular and Interventional Radiology Specialists [facility] Radiology   Pager. [phone] Clinic. [phone]

## 2023-07-16 NOTE — Progress Notes (Signed)
Interventional Radiology Brief Note:  IR consulted for replacement of tunneled HD catheter after line holiday due to MRSA bacteremia.  Due to acute stroke, patient oriented at times, but does not have capacity to make decisions per Neurology.  Patient no longer has dialysis access.  Despite attempting to reach patient's family several times over the past 3 days, none has been available.  He has had no documented visitors.  Discussed with care team, proceed with HD catheter placement with emergent consent due to need to resume HD, a continuation of his current level of care.   Loyce Dys, MS RD PA-C

## 2023-07-16 NOTE — Progress Notes (Addendum)
[facility] NEPHROLOGY PROGRESS NOTE  Assessment/ Plan: Pt is a 42 y.o. yo male   OP HD: MWF [facility] 4h  400/1.5    77kg   2/3 bath  [facility] TDC    Heparin none - last OP HD 10/7, post wt 74.6kg - coming off 75- 76kg - misses HD once every other week approx - rocaltrol 1.50 mcg  - no esa  # AMS: MRI with NKDA stroke, neurology is following.  Mental status improving.  # Sepsis - hx of MRSA bacteremia in 03/2023. On vanc. BCx's + for MRSA 10/12, 10/13 NGTD at 5 days. X rays of R elbow and foot 10/14 ok.  TEE 10/16 with vegetations - CT surgery requests repeat TEE 2 wks. ID is following and rec cath holiday - catheter out 10/16 and with repeat cultures clear, afeb x 48h. Plan for [facility] placement.  # ESRD - on HD MWF. Tolerating HD fine.  HD today after catheter placement.  # Dialysis access: Patient was alert, awake and oriented to hospital, name and October 2024.  I discussed about needing HD catheter placement in order to do dialysis.  Patient reports understanding and agreed to proceed ahead.  If patient has intermittent confusion during consent, I agree that he will still need HD catheter placement in order to receive dialysis   The patient's RN was  presented at the bedside as well. I will contact IR.  # HTN/ BP - BP's are soft in the 90s- 110 range but tolerating HD.  # Volume - no vol excess, at his dry wt. CXR clear.  UF with HD.   # Anemia esrd - Hb 9s. No esa at OP unit - start low dose here. 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   #MBD ckd - CCa in range, phos better with binders. On VDRA, sensipar, renvela.   Addendum 11;35 AM Patient is currently in IR for the procedure.  Unable to obtain consent therefore we will proceed with emergency consent.  Subjective:  Seen and examined at bedside. He was alert, awake asking to eat regular food. Awaiting IR procedure for [facility] placement. RN was presented as well. Objective Vital signs in last 24 hours: Vitals:   07/15/23 1939 07/15/23  2300 07/16/23 0500 07/16/23 0934  BP: 114/73 116/67 132/78 118/71  Pulse: 91 89 97 100  Resp:  18 18   Temp: 97.7 F (36.5 C) 98.7 F (37.1 C) 99.8 F (37.7 C) 98.4 F (36.9 C)  TempSrc: Oral Oral Oral Oral  SpO2: 96% 99% 100% 100%  Weight:       Weight change:   Intake/Output Summary (Last 24 hours) at 07/16/2023 1005 Last data filed at 07/15/2023 1400 Gross per 24 hour  Intake 240 ml  Output --  Net 240 ml       Labs: RENAL PANEL Recent Labs  Lab 07/09/23 2231 07/10/23 0014 07/10/23 0142 07/10/23 0551 07/11/23 1300 07/12/23 0520 07/13/23 0507 07/14/23 0910 07/15/23 0609  NA 125*   < > 126*   < > 132* 131* 132* 127* 131*  K 5.0   < > 4.8   < > 4.2 3.3* 3.6 4.3 4.1  CL 85*   < > 85*   < > 98 96* 95* 92* 89*  CO2 <7*  --  <7*   < > 16* 22 21* 16* 14*  GLUCOSE 497*   < > 542*   < > 163* 100* 89 191* 332*  BUN 64*   < >  65*   < > 77* 39* 51* 62* 49*  CREATININE 16.48*   < > 15.81*   < > 15.58* 8.83* 10.30* 11.70* 8.54*  CALCIUM 9.5  --  9.0   < > 9.2 8.6* 9.1 8.1* 8.3*  MG 2.4  --  2.5*  --   --   --   --   --   --   PHOS  --   --  7.4*  --   --   --   --   --  5.2*  ALBUMIN 2.8*  --  2.6*  --   --   --   --   --   --    < > = values in this interval not displayed.    Liver Function Tests: Recent Labs  Lab 07/09/23 2231 07/10/23 0142  AST 33 29  ALT 13 16  ALKPHOS 178* 176*  BILITOT 1.9* 2.2*  PROT 7.0 6.9  ALBUMIN 2.8* 2.6*   No results for input(s): "LIPASE", "AMYLASE" in the last 168 hours. Recent Labs  Lab 07/09/23 2231  AMMONIA 16   CBC: Recent Labs    07/10/23 0142 07/11/23 0547 07/12/23 0520 07/14/23 0910 07/15/23 0609  HGB 10.0* 10.2* 10.0* 9.2* 9.3*  MCV 95.7 84.9 82.4 87.1 88.8    Cardiac Enzymes: No results for input(s): "CKTOTAL", "CKMB", "CKMBINDEX", "TROPONINI" in the last 168 hours. CBG: Recent Labs  Lab 07/15/23 1436 07/15/23 1759 07/15/23 1951 07/16/23 0511 07/16/23 1002  GLUCAP 381* 355* 382* 267* 267*     Iron Studies: No results for input(s): "IRON", "TIBC", "TRANSFERRIN", "FERRITIN" in the last 72 hours. Studies/Results: MR ANGIO HEAD WO CONTRAST  Result Date: 07/15/2023 CLINICAL DATA:  Septic emboli EXAM: MRA HEAD WITHOUT CONTRAST TECHNIQUE: Angiographic images of the Circle of Willis were acquired using MRA technique without intravenous contrast. COMPARISON:  No prior MRA available, correlation is made with MRI head 07/15/2023 FINDINGS: Anterior circulation: Both internal carotid arteries are patent to the termini, without significant stenosis. A1 segments patent. Normal anterior communicating artery. Anterior cerebral arteries are patent to their distal aspects without significant stenosis. No M1 stenosis or occlusion. Poor signal in a long segment of a right M2 branch (series 14, images 103-142). MCA branches are otherwise perfused to their distal aspects without significant stenosis, although there is overall decreased flow signal in the right frontal lobe in the area of infarction noted on the same-day MRI. Posterior circulation: Vertebral arteries patent to the vertebrobasilar junction without stenosis. Posterior inferior cerebral arteries patent bilaterally. Basilar patent to its distal aspect. Superior cerebellar arteries patent proximally. Patent P1 segments. PCAs perfused to their distal aspects without significant stenosis. The bilateral posterior communicating arteries are patent. Anatomic variants: None significant IMPRESSION: 1. Poor signal in a long segment of a right M2 branch, which is favored to be artifactual, but could represent a true stenosis or occlusion. 2. No other significant intracranial stenosis. Electronically Signed   By: Wiliam Ke M.D.   On: 07/15/2023 21:46   MR BRAIN WO CONTRAST  Result Date: 07/15/2023 CLINICAL DATA:  Neuro deficit with acute stroke suspected. Altered mental status. Cervical radiculopathy. EXAM: MRI HEAD WITHOUT CONTRAST MRI CERVICAL SPINE  WITHOUT CONTRAST TECHNIQUE: Multiplanar, multiecho pulse sequences of the brain and surrounding structures, and cervical spine, to include the craniocervical junction and cervicothoracic junction, were obtained without intravenous contrast. COMPARISON:  Head and cervical spine CT 08/08/2021. Head CT 07/09/2023 FINDINGS: MRI HEAD FINDINGS Brain: Sizable infarct in the right frontal lobe  and insula extending into the parietal lobe, upper division MCA territory. Small acute infarcts seen in the left occipital lobe and bilateral cerebellum. ADC map correlation is limited due to the to the degree of motion artifact. Petechial hemorrhage by gradient imaging and T1 hyperintensity at the level of the confluent right cerebral infarct. No hematoma, hydrocephalus, or shift. Vascular: Grossly preserved flow voids Skull and upper cervical spine: Normal marrow signal Sinuses/Orbits: Opacified left maxillary sinus, new from 2022 and also seen on most recent head CT. MRI CERVICAL SPINE FINDINGS Alignment: Normal Vertebrae: Diffusely hypointense marrow on T1 weighted imaging, likely related to patient's history of end-stage renal disease with anemia. No focal lesion is seen. On STIR imaging no detected marrow edema to imply infection or fracture Cord: Normal shape and volume by sagittal T1 weighted imaging; nondiagnostic assessment on the other sequences. Posterior Fossa, vertebral arteries, paraspinal tissues: Prevertebral edema/fluid without visible source. There is muscular and subcutaneous edema which is generalized and could be from volume overload. Disc levels: No visible herniation or impingement These results will be called to the ordering clinician or representative by the Radiologist Assistant, and communication documented in the PACS or [employer]. IMPRESSION: Brain MRI: 1. 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 Small acute infarcts in the left occipital lobe and bilateral cerebellum.  Petechial hemorrhage is present without hematoma. No worrisome mass effect. 2. Significant motion artifact. Cervical MRI: 1. Marked motion artifact with most images being nondiagnostic. 2. Prevertebral edema/fluid but no visible spinal source or drainable collection. Electronically Signed   By: Tiburcio Pea M.D.   On: 07/15/2023 12:35   MR CERVICAL SPINE WO CONTRAST  Result Date: 07/15/2023 CLINICAL DATA:  Neuro deficit with acute stroke suspected. Altered mental status. Cervical radiculopathy. EXAM: MRI HEAD WITHOUT CONTRAST MRI CERVICAL SPINE WITHOUT CONTRAST TECHNIQUE: Multiplanar, multiecho pulse sequences of the brain and surrounding structures, and cervical spine, to include the craniocervical junction and cervicothoracic junction, were obtained without intravenous contrast. COMPARISON:  Head and cervical spine CT 08/08/2021. Head CT 07/09/2023 FINDINGS: MRI HEAD FINDINGS Brain: Sizable infarct in the right frontal lobe and insula extending into the parietal lobe, upper division MCA territory. Small acute infarcts seen in the left occipital lobe and bilateral cerebellum. ADC map correlation is limited due to the to the degree of motion artifact. Petechial hemorrhage by gradient imaging and T1 hyperintensity at the level of the confluent right cerebral infarct. No hematoma, hydrocephalus, or shift. Vascular: Grossly preserved flow voids Skull and upper cervical spine: Normal marrow signal Sinuses/Orbits: Opacified left maxillary sinus, new from 2022 and also seen on most recent head CT. MRI CERVICAL SPINE FINDINGS Alignment: Normal Vertebrae: Diffusely hypointense marrow on T1 weighted imaging, likely related to patient's history of end-stage renal disease with anemia. No focal lesion is seen. On STIR imaging no detected marrow edema to imply infection or fracture Cord: Normal shape and volume by sagittal T1 weighted imaging; nondiagnostic assessment on the other sequences. Posterior Fossa, vertebral  arteries, paraspinal tissues: Prevertebral edema/fluid without visible source. There is muscular and subcutaneous edema which is generalized and could be from volume overload. Disc levels: No visible herniation or impingement These results will be called to the ordering clinician or representative by the Radiologist Assistant, and communication documented in the PACS or [employer]. IMPRESSION: Brain MRI: 1. 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 Small acute infarcts in the left occipital lobe and bilateral cerebellum. Petechial hemorrhage is present without hematoma. No worrisome mass  effect. 2. Significant motion artifact. Cervical MRI: 1. Marked motion artifact with most images being nondiagnostic. 2. Prevertebral edema/fluid but no visible spinal source or drainable collection. Electronically Signed   By: Tiburcio Pea M.D.   On: 07/15/2023 12:35   ECHO TEE  Result Date: 07/14/2023    TRANSESOPHOGEAL ECHO REPORT   Patient Name:   [name] Date of Exam: 07/14/2023 Medical Rec #:  [id number]     Height:       67.0 in Accession #:    [id number]    Weight:       166.9 lb Date of Birth:  05/11/1981    BSA:          1.873 m Patient Age:    41 years      BP:           127/60 mmHg Patient Gender: M             HR:           92 bpm. Exam Location:  Inpatient Procedure: Transesophageal Echo, 3D Echo, Cardiac Doppler and Color Doppler Indications:    Bacteremia  History:        Patient has no prior history of Echocardiogram examinations.                 Aortic Valve Disease, Mitral Valve Disease, Pulmonic Valve                 Disease and Endocarditis; Signs/Symptoms:Bacteremia.  Sonographer:    Lucendia Herrlich RCS Referring Phys: [id number] Jeanella Craze PROCEDURE: After discussion of the risks and benefits of a TEE, an informed consent was obtained from the patient. The transesophogeal probe was passed without difficulty through the esophogus of the patient. Sedation performed by  different physician. The patient's vital signs; including heart rate, blood pressure, and oxygen saturation; remained stable throughout the procedure. The patient developed no complications during the procedure.  IMPRESSIONS  1. Left ventricular ejection fraction, by estimation, is 30 to 35%. The left ventricle has moderately decreased function. The left ventricle has no regional wall motion abnormalities.  2. Right ventricular systolic function is normal. The right ventricular size is normal.  3. No left atrial/left atrial appendage thrombus was detected.  4. Large mass (2.00 cm x 0.88 cm ) on the posterior mitral leaflet which appears to be extending in the mitral annulus . Will benefit from cardiac CT morphology. The mitral valve is abnormal. Mild to moderate mitral valve regurgitation. No evidence of mitral stenosis.  5. There are two mobile masses visible on the anterior leaflet of the tricupid valve which also appears to have a perforation. Another mobile mass ( 0.68 cm x 0.67 cm) on the posterior tricupid leaflet. Moderate Tricuspid regurgitation which appears to be through the leaflet perforation . The tricuspid valve is abnormal.  6. The aortic valve is abnormal. Aortic valve regurgitation is not visualized. No aortic stenosis is present.  7. The inferior vena cava is normal in size with greater than 50% respiratory variability, suggesting right atrial pressure of 3 mmHg. 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 Findings concerning for tricuspid valve vegetation. Cardiac CT morphology will be beneficial. Consult for CT surgery. FINDINGS  Left Ventricle: Left ventricular ejection fraction, by estimation, is 30 to 35%. The left ventricle has moderately decreased function. The left ventricle has no regional wall motion abnormalities. The left ventricular internal cavity size was normal in size. There is no left  ventricular hypertrophy.  Right Ventricle: The right ventricular size is normal. No increase in right ventricular wall thickness. Right ventricular systolic function is normal. Left Atrium: Left atrial size was normal in size. No left atrial/left atrial appendage thrombus was detected. Right Atrium: Right atrial size was normal in size. Pericardium: There is no evidence of pericardial effusion. Mitral Valve: Large mass (2.00 cm x 0.88 cm ) on the posterior mitral leaflet which appears to be extending in the mitral annulus . Will benefit from cardiac CT morphology. The mitral valve is abnormal. Mild to moderate mitral valve regurgitation. No evidence of mitral valve stenosis. Tricuspid Valve: There are two mobile masses visible on the anterior leaflet of the tricupid valve which also appears to have a perforation. Another mobile mass ( 0.68 cm x 0.67 cm) on the posterior tricupid leaflet. Moderate Tricuspid regurgitation which appears to be through the leaflet perforation. The tricuspid valve is abnormal. Aortic Valve: The aortic valve is severely thickened. There is a filamentous mass (0.56 cm) on the. The aortic valve is abnormal. Aortic valve regurgitation is not visualized. No aortic stenosis is present. Pulmonic Valve: The pulmonic valve was normal in structure. Pulmonic valve regurgitation is not visualized. No evidence of pulmonic stenosis. Aorta: The aortic root and ascending aorta are structurally normal, with no evidence of dilitation. Venous: The right upper pulmonary vein, right lower pulmonary vein, left upper pulmonary vein and left lower pulmonary vein are normal. A normal flow pattern is recorded from the left upper pulmonary vein. The inferior vena cava is normal in size with greater than 50% respiratory variability, suggesting right atrial pressure of 3 mmHg. IAS/Shunts: No atrial level shunt detected by color flow Doppler. Additional Comments: Spectral Doppler performed.  AORTA Ao Root diam: 3.00 cm Ao Asc diam:  2.90 cm  Kardie Tobb DO Electronically signed by Thomasene Ripple DO Signature Date/Time: 07/14/2023/4:50:32 PM    Final    EP STUDY  Result Date: 07/14/2023 See surgical note for result.   Medications: Infusions:   ceFAZolin (ANCEF) IV     vancomycin Stopped (07/14/23 1842)    Scheduled Medications:  calcitRIOL  1.5 mcg Oral Q M,W,F-HD   Chlorhexidine Gluconate Cloth  6 each Topical Q0600   cinacalcet  120 mg Oral Q supper   darbepoetin (ARANESP) injection - DIALYSIS  40 mcg Subcutaneous Q Fri-1800   famotidine  10 mg Oral Daily   feeding supplement (GLUCERNA SHAKE)  237 mL Oral TID BM   gabapentin  100 mg Oral TID   insulin aspart  0-6 Units Subcutaneous TID WC   insulin glargine-yfgn  10 Units Subcutaneous Daily   multivitamin with minerals  1 tablet Oral QHS   sevelamer carbonate  2,400 mg Oral TID WC    have reviewed scheduled and prn medications.  Physical Exam: General:NAD, comfortable Heart:RRR, s1s2 nl Lungs:clear b/l, no crackle Abdomen:soft, Non-tender, non-distended Extremities:No edema Dialysis Access: no access now.   Dut[place]ss Crosland Prasad Lazara Grieser 07/16/2023,10:05 AM  LOS: 6 days

## 2023-07-16 NOTE — Progress Notes (Signed)
*  PRELIMINARY RESULTS* Vascular Ultrasound Carotid Duplex (Doppler) has been completed.  Preliminary findings: Normal  [name] 07/16/2023, 3:47 PM

## 2023-07-16 NOTE — Procedures (Signed)
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 Successful placement of LEFT, femoral-approach,  33 cm  (tip-to-cuff), tunneled hemodialysis catheter with the tip of the catheter in the infra-renal IVC.   Plan: Catheter ready for use.   See detailed procedure note with images in PACS. The patient tolerated the procedure well without incident or complication and was returned to Recovery in stable condition.      Roanna Banning, MD [facility]  Some information was entered later than the data was gathered due to patient care needs. The stated time with the data is accurate.  Received patient in bed to unit.   Alert and oriented.   Informed consent signed and in chart.   Hand off given to oncoming dialysis nurse  Shane Kurek L. Dareen Piano, RN Kidney Dialysis Unit.

## 2023-07-16 NOTE — Progress Notes (Signed)
OT Cancellation Note  Patient Details Name: BLAINE MCCALLA MRN: [id number] DOB: 16-Oct-1980   Cancelled Treatment:    Reason Eval/Treat Not Completed: Patient at procedure or test/ unavailable (working with ST. Will follow up later time.)  [facility] 07/16/2023, 2:36 PM Luisa Dago, OT/L   Acute OT Clinical Specialist Acute Rehabilitation Services Pager [phone] Office [phone]

## 2023-07-16 NOTE — Plan of Care (Signed)
Problem: Education: Goal: Ability to describe self-care measures that may prevent or decrease complications (Diabetes Survival Skills Education) will improve Outcome: Progressing Goal: Individualized Educational Video(s) Outcome: Progressing   Problem: Coping: Goal: Ability to adjust to condition or change in health will improve Outcome: Progressing   Problem: Fluid Volume: Goal: Ability to maintain a balanced intake and output will improve Outcome: Progressing   Problem: Health Behavior/Discharge Planning: Goal: Ability to identify and utilize available resources and services will improve Outcome: Progressing Goal: Ability to manage health-related needs will improve Outcome: Progressing   Problem: Metabolic: Goal: Ability to maintain appropriate glucose levels will improve Outcome: Progressing   Problem: Nutritional: Goal: Maintenance of adequate nutrition will improve Outcome: Progressing Goal: Progress toward achieving an optimal weight will improve Outcome: Progressing   Problem: Skin Integrity: Goal: Risk for impaired skin integrity will decrease Outcome: Progressing   Problem: Tissue Perfusion: Goal: Adequacy of tissue perfusion will improve Outcome: Progressing   Problem: Education: Goal: Ability to describe self-care measures that may prevent or decrease complications (Diabetes Survival Skills Education) will improve Outcome: Progressing Goal: Individualized Educational Video(s) Outcome: Progressing   Problem: Cardiac: Goal: Ability to maintain an adequate cardiac output will improve Outcome: Progressing   Problem: Health Behavior/Discharge Planning: Goal: Ability to identify and utilize available resources and services will improve Outcome: Progressing Goal: Ability to manage health-related needs will improve Outcome: Progressing   Problem: Fluid Volume: Goal: Ability to achieve a balanced intake and output will improve Outcome: Progressing    Problem: Metabolic: Goal: Ability to maintain appropriate glucose levels will improve Outcome: Progressing   Problem: Nutritional: Goal: Maintenance of adequate nutrition will improve Outcome: Progressing Goal: Maintenance of adequate weight for body size and type will improve Outcome: Progressing   Problem: Respiratory: Goal: Will regain and/or maintain adequate ventilation Outcome: Progressing   Problem: Urinary Elimination: Goal: Ability to achieve and maintain adequate renal perfusion and functioning will improve Outcome: Progressing   Problem: Education: Goal: Knowledge of General Education information will improve Description: Including pain rating scale, medication(s)/side effects and non-pharmacologic comfort measures Outcome: Progressing   Problem: Health Behavior/Discharge Planning: Goal: Ability to manage health-related needs will improve Outcome: Progressing   Problem: Clinical Measurements: Goal: Ability to maintain clinical measurements within normal limits will improve Outcome: Progressing Goal: Will remain free from infection Outcome: Progressing Goal: Diagnostic test results will improve Outcome: Progressing Goal: Respiratory complications will improve Outcome: Progressing Goal: Cardiovascular complication will be avoided Outcome: Progressing   Problem: Activity: Goal: Risk for activity intolerance will decrease Outcome: Progressing   Problem: Nutrition: Goal: Adequate nutrition will be maintained Outcome: Progressing   Problem: Coping: Goal: Level of anxiety will decrease Outcome: Progressing   Problem: Elimination: Goal: Will not experience complications related to bowel motility Outcome: Progressing Goal: Will not experience complications related to urinary retention Outcome: Progressing   Problem: Pain Managment: Goal: General experience of comfort will improve Outcome: Progressing   Problem: Safety: Goal: Ability to remain free  from injury will improve Outcome: Progressing   Problem: Skin Integrity: Goal: Risk for impaired skin integrity will decrease Outcome: Progressing   Problem: Education: Goal: Understanding of post-operative needs will improve Outcome: Progressing Goal: Individualized Educational Video(s) Outcome: Progressing   Problem: Clinical Measurements: Goal: Postoperative complications will be avoided or minimized Outcome: Progressing   Problem: Respiratory: Goal: Will regain and/or maintain adequate ventilation Outcome: Progressing   Problem: Education: Goal: Knowledge of disease or condition will improve Outcome: Progressing Goal: Knowledge of secondary prevention will improve (MUST DOCUMENT  ALL) Outcome: Progressing Goal: Knowledge of patient specific risk factors will improve Loraine Leriche N/A or DELETE if not current risk factor) Outcome: Progressing   Problem: Ischemic Stroke/TIA Tissue Perfusion: Goal: Complications of ischemic stroke/TIA will be minimized Outcome: Progressing

## 2023-07-16 NOTE — Progress Notes (Addendum)
new from 2022 and also seen on most recent head CT. MRI CERVICAL SPINE FINDINGS Alignment: Normal Vertebrae: Diffusely hypointense marrow on T1 weighted imaging, likely related to patient's history of end-stage renal disease with anemia. No focal lesion is seen. On STIR imaging no detected marrow edema to imply infection or fracture Cord: Normal shape and volume by sagittal T1 weighted imaging; nondiagnostic assessment on the other sequences. Posterior Fossa, vertebral arteries, paraspinal tissues: Prevertebral edema/fluid without visible source. There is muscular and subcutaneous edema which is generalized and could be from volume overload. Disc levels: No visible herniation or impingement These results will be called to the ordering clinician or representative by the Radiologist Assistant, and communication documented in the PACS or [employer]. IMPRESSION: Brain MRI: 1. 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 Small acute infarcts in the left occipital lobe and bilateral cerebellum. Petechial hemorrhage is present without hematoma. No worrisome mass effect. 2. Significant motion artifact. Cervical MRI: 1. Marked motion artifact with most images being nondiagnostic. 2. Prevertebral edema/fluid but no visible spinal source or drainable collection. Electronically Signed   By: Tiburcio Pea M.D.   On: 07/15/2023 12:35   ECHO TEE  Result Date: 07/14/2023    TRANSESOPHOGEAL ECHO REPORT   Patient Name:   [name] Date of Exam: 07/14/2023 Medical Rec #:   [id number]     Height:       67.0 in Accession #:    [id number]    Weight:       166.9 lb Date of Birth:  [birth year]    BSA:          1.873 m Patient Age:    41 years      BP:           127/60 mmHg Patient Gender: M             HR:           92 bpm. Exam Location:  Inpatient Procedure: Transesophageal Echo, 3D Echo, Cardiac Doppler and Color Doppler Indications:    Bacteremia  History:        Patient has no prior history of Echocardiogram examinations.                 Aortic Valve Disease, Mitral Valve Disease, Pulmonic Valve                 Disease and Endocarditis; Signs/Symptoms:Bacteremia.  Sonographer:    Lucendia Herrlich RCS Referring Phys: [id number] Jeanella Craze PROCEDURE: After discussion of the risks and benefits of a TEE, an informed consent was obtained from the patient. The transesophogeal probe was passed without difficulty through the esophogus of the patient. Sedation performed by different physician. The patient's vital signs; including heart rate, blood pressure, and oxygen saturation; remained stable throughout the procedure. The patient developed no complications during the procedure.  IMPRESSIONS  1. Left ventricular ejection fraction, by estimation, is 30 to 35%. The left ventricle has moderately decreased function. The left ventricle has no regional wall motion abnormalities.  2. Right ventricular systolic function is normal. The right ventricular size is normal.  3. No left atrial/left atrial appendage thrombus was detected.  4. Large mass (2.00 cm x 0.88 cm ) on the posterior mitral leaflet which appears to be extending in the mitral annulus . Will benefit from cardiac CT morphology. The mitral valve is abnormal. Mild to moderate mitral valve regurgitation. No evidence of mitral stenosis.  5. There are two mobile  new from 2022 and also seen on most recent head CT. MRI CERVICAL SPINE FINDINGS Alignment: Normal Vertebrae: Diffusely hypointense marrow on T1 weighted imaging, likely related to patient's history of end-stage renal disease with anemia. No focal lesion is seen. On STIR imaging no detected marrow edema to imply infection or fracture Cord: Normal shape and volume by sagittal T1 weighted imaging; nondiagnostic assessment on the other sequences. Posterior Fossa, vertebral arteries, paraspinal tissues: Prevertebral edema/fluid without visible source. There is muscular and subcutaneous edema which is generalized and could be from volume overload. Disc levels: No visible herniation or impingement These results will be called to the ordering clinician or representative by the Radiologist Assistant, and communication documented in the PACS or [employer]. IMPRESSION: Brain MRI: 1. 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 Small acute infarcts in the left occipital lobe and bilateral cerebellum. Petechial hemorrhage is present without hematoma. No worrisome mass effect. 2. Significant motion artifact. Cervical MRI: 1. Marked motion artifact with most images being nondiagnostic. 2. Prevertebral edema/fluid but no visible spinal source or drainable collection. Electronically Signed   By: Tiburcio Pea M.D.   On: 07/15/2023 12:35   ECHO TEE  Result Date: 07/14/2023    TRANSESOPHOGEAL ECHO REPORT   Patient Name:   [name] Date of Exam: 07/14/2023 Medical Rec #:   [id number]     Height:       67.0 in Accession #:    [id number]    Weight:       166.9 lb Date of Birth:  [birth year]    BSA:          1.873 m Patient Age:    41 years      BP:           127/60 mmHg Patient Gender: M             HR:           92 bpm. Exam Location:  Inpatient Procedure: Transesophageal Echo, 3D Echo, Cardiac Doppler and Color Doppler Indications:    Bacteremia  History:        Patient has no prior history of Echocardiogram examinations.                 Aortic Valve Disease, Mitral Valve Disease, Pulmonic Valve                 Disease and Endocarditis; Signs/Symptoms:Bacteremia.  Sonographer:    Lucendia Herrlich RCS Referring Phys: [id number] Jeanella Craze PROCEDURE: After discussion of the risks and benefits of a TEE, an informed consent was obtained from the patient. The transesophogeal probe was passed without difficulty through the esophogus of the patient. Sedation performed by different physician. The patient's vital signs; including heart rate, blood pressure, and oxygen saturation; remained stable throughout the procedure. The patient developed no complications during the procedure.  IMPRESSIONS  1. Left ventricular ejection fraction, by estimation, is 30 to 35%. The left ventricle has moderately decreased function. The left ventricle has no regional wall motion abnormalities.  2. Right ventricular systolic function is normal. The right ventricular size is normal.  3. No left atrial/left atrial appendage thrombus was detected.  4. Large mass (2.00 cm x 0.88 cm ) on the posterior mitral leaflet which appears to be extending in the mitral annulus . Will benefit from cardiac CT morphology. The mitral valve is abnormal. Mild to moderate mitral valve regurgitation. No evidence of mitral stenosis.  5. There are two mobile  PROGRESS NOTE  [name]  BJY:[id number] DOB: [birth year] DOA: 07/09/2023 PCP: [name], NP   Brief Narrative: Patient is a 42 year old male with history of ESRD on dialysis, insulin-dependent diabetes mellitus, gastroparesis, left AKA, hypertension who was brought here on 10/11 with altered mental status.  He was recently admitted here on July for sepsis secondary to right diabetic foot ulcer, cultures grew MRSA.  Dialysis catheter was removed and replaced.  He was treated with vancomycin, refused echo and left on 7/7 against  medical advice.  On 10/11, he was found to be confused and brought to ED.  When he was brought in, dialysis catheter was partially removed.  He was also found to be in DKA.  He started on insulin drip.  Lab work showed elevated leukocytes, lactate of 2.4.  He was hypotensive.  Admitted for septic shock.  Hospital course remarkable for finding of MRSA in blood cultures.  ID following.  Nephrology following for dialysis.  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  TEE done on 10/16 showed vegetation on multiple valves including big one on mitral valve, cardiothoracic surgery consulted.  Plan for conservative treatment with antibiotics.  MRI of the brain done on 10/17 showed sizable right MCA branch infarct.  Neurology following.  Assessment & Plan:  Principal Problem:   Endocarditis of mitral valve Active Problems:   DKA (diabetic ketoacidosis) ([name])   Altered mental status   MRSA bacteremia   Hemodialysis catheter infection ([name])   Right elbow pain   ESRD on hemodialysis ([name])   Acute systolic CHF (congestive heart failure) ([name])   DCM (dilated cardiomyopathy) ([name])   Coronary artery calcification seen on CAT scan   Infective endocarditis of tricuspid valve  Septic shock due to MRSA bacteremia: Was admitted on July and was found to have MRSA bacteremia and left AMA.  Presented with septic shock with hypotension, leukocytosis,  encephalopathy.  Blood cultures again showed MRSA.  Currently on vancomycin.  Suspected HD line infection.ID following. Afebrile this morning  Infective endocarditis: TEE done on 10/16 showed  vegetations on multiple valves but mainly in the mitral valve.  Cardiothoracic surgery consulted, recommending conservative management with antibiotics for 6 weeks, repeat TEE in 1 to 2 weeks. He may need to finish the antibiotics in the hospital  Acute ischemic stroke/septic infarct:: Patient noticed to have weakness on the left upper extremity.  MRI of the brain done on 10/17 showed sizable right MCA branch infarct.  Likely septic infarct.  Neurology following.  MRI of the brain showed poor signal on the right M2 branch ,could not rule out stenosis.  Flaccid weakness in the left upper extremity  Combined systolic/diastolic CHF: Echo showed EF of 25 to 30%, severe decrease left ventricular function, global hypokinesis, grade 1 diastolic dysfunction.  2D echo showed filamentous structure on the ventricular side of the mitral valve, thickened aortic valve leaflets/annulus .  His last echo on 03/2019 showed normal ejection fraction. TEE showed multiple vegetations. He remains euvolemic today  DKA: A1c of 8.9.  Initially started on insulin drip, now transition to long-acting and sliding scale.  Monitor blood sugars.  Diabetic coordinator following.  Sugars fluctuating  Acute encephalopathy/left upper extremity weakness: Likely secondary to stroke, sepsis, DKA and hospital-acquired delirium..  Mental status improving.  Avoid sedation.  He remains alert, awake, obeys commands, and mostly oriented but easily distractible and forgetful.  May not have capacity.  Mental status fluctuating  ESRD on dialysis: Dialysis catheter were partially out when  PROGRESS NOTE  [name]  BJY:[id number] DOB: [birth year] DOA: 07/09/2023 PCP: [name], NP   Brief Narrative: Patient is a 42 year old male with history of ESRD on dialysis, insulin-dependent diabetes mellitus, gastroparesis, left AKA, hypertension who was brought here on 10/11 with altered mental status.  He was recently admitted here on July for sepsis secondary to right diabetic foot ulcer, cultures grew MRSA.  Dialysis catheter was removed and replaced.  He was treated with vancomycin, refused echo and left on 7/7 against  medical advice.  On 10/11, he was found to be confused and brought to ED.  When he was brought in, dialysis catheter was partially removed.  He was also found to be in DKA.  He started on insulin drip.  Lab work showed elevated leukocytes, lactate of 2.4.  He was hypotensive.  Admitted for septic shock.  Hospital course remarkable for finding of MRSA in blood cultures.  ID following.  Nephrology following for dialysis.  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  TEE done on 10/16 showed vegetation on multiple valves including big one on mitral valve, cardiothoracic surgery consulted.  Plan for conservative treatment with antibiotics.  MRI of the brain done on 10/17 showed sizable right MCA branch infarct.  Neurology following.  Assessment & Plan:  Principal Problem:   Endocarditis of mitral valve Active Problems:   DKA (diabetic ketoacidosis) ([name])   Altered mental status   MRSA bacteremia   Hemodialysis catheter infection ([name])   Right elbow pain   ESRD on hemodialysis ([name])   Acute systolic CHF (congestive heart failure) ([name])   DCM (dilated cardiomyopathy) ([name])   Coronary artery calcification seen on CAT scan   Infective endocarditis of tricuspid valve  Septic shock due to MRSA bacteremia: Was admitted on July and was found to have MRSA bacteremia and left AMA.  Presented with septic shock with hypotension, leukocytosis,  encephalopathy.  Blood cultures again showed MRSA.  Currently on vancomycin.  Suspected HD line infection.ID following. Afebrile this morning  Infective endocarditis: TEE done on 10/16 showed  vegetations on multiple valves but mainly in the mitral valve.  Cardiothoracic surgery consulted, recommending conservative management with antibiotics for 6 weeks, repeat TEE in 1 to 2 weeks. He may need to finish the antibiotics in the hospital  Acute ischemic stroke/septic infarct:: Patient noticed to have weakness on the left upper extremity.  MRI of the brain done on 10/17 showed sizable right MCA branch infarct.  Likely septic infarct.  Neurology following.  MRI of the brain showed poor signal on the right M2 branch ,could not rule out stenosis.  Flaccid weakness in the left upper extremity  Combined systolic/diastolic CHF: Echo showed EF of 25 to 30%, severe decrease left ventricular function, global hypokinesis, grade 1 diastolic dysfunction.  2D echo showed filamentous structure on the ventricular side of the mitral valve, thickened aortic valve leaflets/annulus .  His last echo on 03/2019 showed normal ejection fraction. TEE showed multiple vegetations. He remains euvolemic today  DKA: A1c of 8.9.  Initially started on insulin drip, now transition to long-acting and sliding scale.  Monitor blood sugars.  Diabetic coordinator following.  Sugars fluctuating  Acute encephalopathy/left upper extremity weakness: Likely secondary to stroke, sepsis, DKA and hospital-acquired delirium..  Mental status improving.  Avoid sedation.  He remains alert, awake, obeys commands, and mostly oriented but easily distractible and forgetful.  May not have capacity.  Mental status fluctuating  ESRD on dialysis: Dialysis catheter were partially out when  new from 2022 and also seen on most recent head CT. MRI CERVICAL SPINE FINDINGS Alignment: Normal Vertebrae: Diffusely hypointense marrow on T1 weighted imaging, likely related to patient's history of end-stage renal disease with anemia. No focal lesion is seen. On STIR imaging no detected marrow edema to imply infection or fracture Cord: Normal shape and volume by sagittal T1 weighted imaging; nondiagnostic assessment on the other sequences. Posterior Fossa, vertebral arteries, paraspinal tissues: Prevertebral edema/fluid without visible source. There is muscular and subcutaneous edema which is generalized and could be from volume overload. Disc levels: No visible herniation or impingement These results will be called to the ordering clinician or representative by the Radiologist Assistant, and communication documented in the PACS or [employer]. IMPRESSION: Brain MRI: 1. 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 Small acute infarcts in the left occipital lobe and bilateral cerebellum. Petechial hemorrhage is present without hematoma. No worrisome mass effect. 2. Significant motion artifact. Cervical MRI: 1. Marked motion artifact with most images being nondiagnostic. 2. Prevertebral edema/fluid but no visible spinal source or drainable collection. Electronically Signed   By: Tiburcio Pea M.D.   On: 07/15/2023 12:35   ECHO TEE  Result Date: 07/14/2023    TRANSESOPHOGEAL ECHO REPORT   Patient Name:   [name] Date of Exam: 07/14/2023 Medical Rec #:   [id number]     Height:       67.0 in Accession #:    [id number]    Weight:       166.9 lb Date of Birth:  [birth year]    BSA:          1.873 m Patient Age:    41 years      BP:           127/60 mmHg Patient Gender: M             HR:           92 bpm. Exam Location:  Inpatient Procedure: Transesophageal Echo, 3D Echo, Cardiac Doppler and Color Doppler Indications:    Bacteremia  History:        Patient has no prior history of Echocardiogram examinations.                 Aortic Valve Disease, Mitral Valve Disease, Pulmonic Valve                 Disease and Endocarditis; Signs/Symptoms:Bacteremia.  Sonographer:    Lucendia Herrlich RCS Referring Phys: [id number] Jeanella Craze PROCEDURE: After discussion of the risks and benefits of a TEE, an informed consent was obtained from the patient. The transesophogeal probe was passed without difficulty through the esophogus of the patient. Sedation performed by different physician. The patient's vital signs; including heart rate, blood pressure, and oxygen saturation; remained stable throughout the procedure. The patient developed no complications during the procedure.  IMPRESSIONS  1. Left ventricular ejection fraction, by estimation, is 30 to 35%. The left ventricle has moderately decreased function. The left ventricle has no regional wall motion abnormalities.  2. Right ventricular systolic function is normal. The right ventricular size is normal.  3. No left atrial/left atrial appendage thrombus was detected.  4. Large mass (2.00 cm x 0.88 cm ) on the posterior mitral leaflet which appears to be extending in the mitral annulus . Will benefit from cardiac CT morphology. The mitral valve is abnormal. Mild to moderate mitral valve regurgitation. No evidence of mitral stenosis.  5. There are two mobile  new from 2022 and also seen on most recent head CT. MRI CERVICAL SPINE FINDINGS Alignment: Normal Vertebrae: Diffusely hypointense marrow on T1 weighted imaging, likely related to patient's history of end-stage renal disease with anemia. No focal lesion is seen. On STIR imaging no detected marrow edema to imply infection or fracture Cord: Normal shape and volume by sagittal T1 weighted imaging; nondiagnostic assessment on the other sequences. Posterior Fossa, vertebral arteries, paraspinal tissues: Prevertebral edema/fluid without visible source. There is muscular and subcutaneous edema which is generalized and could be from volume overload. Disc levels: No visible herniation or impingement These results will be called to the ordering clinician or representative by the Radiologist Assistant, and communication documented in the PACS or [employer]. IMPRESSION: Brain MRI: 1. 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 Small acute infarcts in the left occipital lobe and bilateral cerebellum. Petechial hemorrhage is present without hematoma. No worrisome mass effect. 2. Significant motion artifact. Cervical MRI: 1. Marked motion artifact with most images being nondiagnostic. 2. Prevertebral edema/fluid but no visible spinal source or drainable collection. Electronically Signed   By: Tiburcio Pea M.D.   On: 07/15/2023 12:35   ECHO TEE  Result Date: 07/14/2023    TRANSESOPHOGEAL ECHO REPORT   Patient Name:   [name] Date of Exam: 07/14/2023 Medical Rec #:   [id number]     Height:       67.0 in Accession #:    [id number]    Weight:       166.9 lb Date of Birth:  [birth year]    BSA:          1.873 m Patient Age:    41 years      BP:           127/60 mmHg Patient Gender: M             HR:           92 bpm. Exam Location:  Inpatient Procedure: Transesophageal Echo, 3D Echo, Cardiac Doppler and Color Doppler Indications:    Bacteremia  History:        Patient has no prior history of Echocardiogram examinations.                 Aortic Valve Disease, Mitral Valve Disease, Pulmonic Valve                 Disease and Endocarditis; Signs/Symptoms:Bacteremia.  Sonographer:    Lucendia Herrlich RCS Referring Phys: [id number] Jeanella Craze PROCEDURE: After discussion of the risks and benefits of a TEE, an informed consent was obtained from the patient. The transesophogeal probe was passed without difficulty through the esophogus of the patient. Sedation performed by different physician. The patient's vital signs; including heart rate, blood pressure, and oxygen saturation; remained stable throughout the procedure. The patient developed no complications during the procedure.  IMPRESSIONS  1. Left ventricular ejection fraction, by estimation, is 30 to 35%. The left ventricle has moderately decreased function. The left ventricle has no regional wall motion abnormalities.  2. Right ventricular systolic function is normal. The right ventricular size is normal.  3. No left atrial/left atrial appendage thrombus was detected.  4. Large mass (2.00 cm x 0.88 cm ) on the posterior mitral leaflet which appears to be extending in the mitral annulus . Will benefit from cardiac CT morphology. The mitral valve is abnormal. Mild to moderate mitral valve regurgitation. No evidence of mitral stenosis.  5. There are two mobile  new from 2022 and also seen on most recent head CT. MRI CERVICAL SPINE FINDINGS Alignment: Normal Vertebrae: Diffusely hypointense marrow on T1 weighted imaging, likely related to patient's history of end-stage renal disease with anemia. No focal lesion is seen. On STIR imaging no detected marrow edema to imply infection or fracture Cord: Normal shape and volume by sagittal T1 weighted imaging; nondiagnostic assessment on the other sequences. Posterior Fossa, vertebral arteries, paraspinal tissues: Prevertebral edema/fluid without visible source. There is muscular and subcutaneous edema which is generalized and could be from volume overload. Disc levels: No visible herniation or impingement These results will be called to the ordering clinician or representative by the Radiologist Assistant, and communication documented in the PACS or [employer]. IMPRESSION: Brain MRI: 1. 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 Small acute infarcts in the left occipital lobe and bilateral cerebellum. Petechial hemorrhage is present without hematoma. No worrisome mass effect. 2. Significant motion artifact. Cervical MRI: 1. Marked motion artifact with most images being nondiagnostic. 2. Prevertebral edema/fluid but no visible spinal source or drainable collection. Electronically Signed   By: Tiburcio Pea M.D.   On: 07/15/2023 12:35   ECHO TEE  Result Date: 07/14/2023    TRANSESOPHOGEAL ECHO REPORT   Patient Name:   [name] Date of Exam: 07/14/2023 Medical Rec #:   [id number]     Height:       67.0 in Accession #:    [id number]    Weight:       166.9 lb Date of Birth:  [birth year]    BSA:          1.873 m Patient Age:    41 years      BP:           127/60 mmHg Patient Gender: M             HR:           92 bpm. Exam Location:  Inpatient Procedure: Transesophageal Echo, 3D Echo, Cardiac Doppler and Color Doppler Indications:    Bacteremia  History:        Patient has no prior history of Echocardiogram examinations.                 Aortic Valve Disease, Mitral Valve Disease, Pulmonic Valve                 Disease and Endocarditis; Signs/Symptoms:Bacteremia.  Sonographer:    Lucendia Herrlich RCS Referring Phys: [id number] Jeanella Craze PROCEDURE: After discussion of the risks and benefits of a TEE, an informed consent was obtained from the patient. The transesophogeal probe was passed without difficulty through the esophogus of the patient. Sedation performed by different physician. The patient's vital signs; including heart rate, blood pressure, and oxygen saturation; remained stable throughout the procedure. The patient developed no complications during the procedure.  IMPRESSIONS  1. Left ventricular ejection fraction, by estimation, is 30 to 35%. The left ventricle has moderately decreased function. The left ventricle has no regional wall motion abnormalities.  2. Right ventricular systolic function is normal. The right ventricular size is normal.  3. No left atrial/left atrial appendage thrombus was detected.  4. Large mass (2.00 cm x 0.88 cm ) on the posterior mitral leaflet which appears to be extending in the mitral annulus . Will benefit from cardiac CT morphology. The mitral valve is abnormal. Mild to moderate mitral valve regurgitation. No evidence of mitral stenosis.  5. There are two mobile  PROGRESS NOTE  [name]  BJY:[id number] DOB: [birth year] DOA: 07/09/2023 PCP: [name], NP   Brief Narrative: Patient is a 42 year old male with history of ESRD on dialysis, insulin-dependent diabetes mellitus, gastroparesis, left AKA, hypertension who was brought here on 10/11 with altered mental status.  He was recently admitted here on July for sepsis secondary to right diabetic foot ulcer, cultures grew MRSA.  Dialysis catheter was removed and replaced.  He was treated with vancomycin, refused echo and left on 7/7 against  medical advice.  On 10/11, he was found to be confused and brought to ED.  When he was brought in, dialysis catheter was partially removed.  He was also found to be in DKA.  He started on insulin drip.  Lab work showed elevated leukocytes, lactate of 2.4.  He was hypotensive.  Admitted for septic shock.  Hospital course remarkable for finding of MRSA in blood cultures.  ID following.  Nephrology following for dialysis.  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  TEE done on 10/16 showed vegetation on multiple valves including big one on mitral valve, cardiothoracic surgery consulted.  Plan for conservative treatment with antibiotics.  MRI of the brain done on 10/17 showed sizable right MCA branch infarct.  Neurology following.  Assessment & Plan:  Principal Problem:   Endocarditis of mitral valve Active Problems:   DKA (diabetic ketoacidosis) ([name])   Altered mental status   MRSA bacteremia   Hemodialysis catheter infection ([name])   Right elbow pain   ESRD on hemodialysis ([name])   Acute systolic CHF (congestive heart failure) ([name])   DCM (dilated cardiomyopathy) ([name])   Coronary artery calcification seen on CAT scan   Infective endocarditis of tricuspid valve  Septic shock due to MRSA bacteremia: Was admitted on July and was found to have MRSA bacteremia and left AMA.  Presented with septic shock with hypotension, leukocytosis,  encephalopathy.  Blood cultures again showed MRSA.  Currently on vancomycin.  Suspected HD line infection.ID following. Afebrile this morning  Infective endocarditis: TEE done on 10/16 showed  vegetations on multiple valves but mainly in the mitral valve.  Cardiothoracic surgery consulted, recommending conservative management with antibiotics for 6 weeks, repeat TEE in 1 to 2 weeks. He may need to finish the antibiotics in the hospital  Acute ischemic stroke/septic infarct:: Patient noticed to have weakness on the left upper extremity.  MRI of the brain done on 10/17 showed sizable right MCA branch infarct.  Likely septic infarct.  Neurology following.  MRI of the brain showed poor signal on the right M2 branch ,could not rule out stenosis.  Flaccid weakness in the left upper extremity  Combined systolic/diastolic CHF: Echo showed EF of 25 to 30%, severe decrease left ventricular function, global hypokinesis, grade 1 diastolic dysfunction.  2D echo showed filamentous structure on the ventricular side of the mitral valve, thickened aortic valve leaflets/annulus .  His last echo on 03/2019 showed normal ejection fraction. TEE showed multiple vegetations. He remains euvolemic today  DKA: A1c of 8.9.  Initially started on insulin drip, now transition to long-acting and sliding scale.  Monitor blood sugars.  Diabetic coordinator following.  Sugars fluctuating  Acute encephalopathy/left upper extremity weakness: Likely secondary to stroke, sepsis, DKA and hospital-acquired delirium..  Mental status improving.  Avoid sedation.  He remains alert, awake, obeys commands, and mostly oriented but easily distractible and forgetful.  May not have capacity.  Mental status fluctuating  ESRD on dialysis: Dialysis catheter were partially out when  PROGRESS NOTE  [name]  BJY:[id number] DOB: [birth year] DOA: 07/09/2023 PCP: [name], NP   Brief Narrative: Patient is a 42 year old male with history of ESRD on dialysis, insulin-dependent diabetes mellitus, gastroparesis, left AKA, hypertension who was brought here on 10/11 with altered mental status.  He was recently admitted here on July for sepsis secondary to right diabetic foot ulcer, cultures grew MRSA.  Dialysis catheter was removed and replaced.  He was treated with vancomycin, refused echo and left on 7/7 against  medical advice.  On 10/11, he was found to be confused and brought to ED.  When he was brought in, dialysis catheter was partially removed.  He was also found to be in DKA.  He started on insulin drip.  Lab work showed elevated leukocytes, lactate of 2.4.  He was hypotensive.  Admitted for septic shock.  Hospital course remarkable for finding of MRSA in blood cultures.  ID following.  Nephrology following for dialysis.  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  TEE done on 10/16 showed vegetation on multiple valves including big one on mitral valve, cardiothoracic surgery consulted.  Plan for conservative treatment with antibiotics.  MRI of the brain done on 10/17 showed sizable right MCA branch infarct.  Neurology following.  Assessment & Plan:  Principal Problem:   Endocarditis of mitral valve Active Problems:   DKA (diabetic ketoacidosis) ([name])   Altered mental status   MRSA bacteremia   Hemodialysis catheter infection ([name])   Right elbow pain   ESRD on hemodialysis ([name])   Acute systolic CHF (congestive heart failure) ([name])   DCM (dilated cardiomyopathy) ([name])   Coronary artery calcification seen on CAT scan   Infective endocarditis of tricuspid valve  Septic shock due to MRSA bacteremia: Was admitted on July and was found to have MRSA bacteremia and left AMA.  Presented with septic shock with hypotension, leukocytosis,  encephalopathy.  Blood cultures again showed MRSA.  Currently on vancomycin.  Suspected HD line infection.ID following. Afebrile this morning  Infective endocarditis: TEE done on 10/16 showed  vegetations on multiple valves but mainly in the mitral valve.  Cardiothoracic surgery consulted, recommending conservative management with antibiotics for 6 weeks, repeat TEE in 1 to 2 weeks. He may need to finish the antibiotics in the hospital  Acute ischemic stroke/septic infarct:: Patient noticed to have weakness on the left upper extremity.  MRI of the brain done on 10/17 showed sizable right MCA branch infarct.  Likely septic infarct.  Neurology following.  MRI of the brain showed poor signal on the right M2 branch ,could not rule out stenosis.  Flaccid weakness in the left upper extremity  Combined systolic/diastolic CHF: Echo showed EF of 25 to 30%, severe decrease left ventricular function, global hypokinesis, grade 1 diastolic dysfunction.  2D echo showed filamentous structure on the ventricular side of the mitral valve, thickened aortic valve leaflets/annulus .  His last echo on 03/2019 showed normal ejection fraction. TEE showed multiple vegetations. He remains euvolemic today  DKA: A1c of 8.9.  Initially started on insulin drip, now transition to long-acting and sliding scale.  Monitor blood sugars.  Diabetic coordinator following.  Sugars fluctuating  Acute encephalopathy/left upper extremity weakness: Likely secondary to stroke, sepsis, DKA and hospital-acquired delirium..  Mental status improving.  Avoid sedation.  He remains alert, awake, obeys commands, and mostly oriented but easily distractible and forgetful.  May not have capacity.  Mental status fluctuating  ESRD on dialysis: Dialysis catheter were partially out when  new from 2022 and also seen on most recent head CT. MRI CERVICAL SPINE FINDINGS Alignment: Normal Vertebrae: Diffusely hypointense marrow on T1 weighted imaging, likely related to patient's history of end-stage renal disease with anemia. No focal lesion is seen. On STIR imaging no detected marrow edema to imply infection or fracture Cord: Normal shape and volume by sagittal T1 weighted imaging; nondiagnostic assessment on the other sequences. Posterior Fossa, vertebral arteries, paraspinal tissues: Prevertebral edema/fluid without visible source. There is muscular and subcutaneous edema which is generalized and could be from volume overload. Disc levels: No visible herniation or impingement These results will be called to the ordering clinician or representative by the Radiologist Assistant, and communication documented in the PACS or [employer]. IMPRESSION: Brain MRI: 1. 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 Small acute infarcts in the left occipital lobe and bilateral cerebellum. Petechial hemorrhage is present without hematoma. No worrisome mass effect. 2. Significant motion artifact. Cervical MRI: 1. Marked motion artifact with most images being nondiagnostic. 2. Prevertebral edema/fluid but no visible spinal source or drainable collection. Electronically Signed   By: Tiburcio Pea M.D.   On: 07/15/2023 12:35   ECHO TEE  Result Date: 07/14/2023    TRANSESOPHOGEAL ECHO REPORT   Patient Name:   [name] Date of Exam: 07/14/2023 Medical Rec #:   [id number]     Height:       67.0 in Accession #:    [id number]    Weight:       166.9 lb Date of Birth:  [birth year]    BSA:          1.873 m Patient Age:    41 years      BP:           127/60 mmHg Patient Gender: M             HR:           92 bpm. Exam Location:  Inpatient Procedure: Transesophageal Echo, 3D Echo, Cardiac Doppler and Color Doppler Indications:    Bacteremia  History:        Patient has no prior history of Echocardiogram examinations.                 Aortic Valve Disease, Mitral Valve Disease, Pulmonic Valve                 Disease and Endocarditis; Signs/Symptoms:Bacteremia.  Sonographer:    Lucendia Herrlich RCS Referring Phys: [id number] Jeanella Craze PROCEDURE: After discussion of the risks and benefits of a TEE, an informed consent was obtained from the patient. The transesophogeal probe was passed without difficulty through the esophogus of the patient. Sedation performed by different physician. The patient's vital signs; including heart rate, blood pressure, and oxygen saturation; remained stable throughout the procedure. The patient developed no complications during the procedure.  IMPRESSIONS  1. Left ventricular ejection fraction, by estimation, is 30 to 35%. The left ventricle has moderately decreased function. The left ventricle has no regional wall motion abnormalities.  2. Right ventricular systolic function is normal. The right ventricular size is normal.  3. No left atrial/left atrial appendage thrombus was detected.  4. Large mass (2.00 cm x 0.88 cm ) on the posterior mitral leaflet which appears to be extending in the mitral annulus . Will benefit from cardiac CT morphology. The mitral valve is abnormal. Mild to moderate mitral valve regurgitation. No evidence of mitral stenosis.  5. There are two mobile

## 2023-07-17 DIAGNOSIS — I059 Rheumatic mitral valve disease, unspecified: Secondary | ICD-10-CM | POA: Diagnosis not present

## 2023-07-17 LAB — GLUCOSE, CAPILLARY
Glucose-Capillary: 188 mg/dL — ABNORMAL HIGH (ref 70–99)
Glucose-Capillary: 118 mg/dL — ABNORMAL HIGH (ref 70–99)
Glucose-Capillary: 175 mg/dL — ABNORMAL HIGH (ref 70–99)
Glucose-Capillary: 175 mg/dL — ABNORMAL HIGH (ref 70–99)

## 2023-07-17 NOTE — Plan of Care (Signed)
  Problem: Coping: Goal: Ability to adjust to condition or change in health will improve Outcome: Progressing   Problem: Metabolic: Goal: Ability to maintain appropriate glucose levels will improve Outcome: Progressing   Problem: Skin Integrity: Goal: Risk for impaired skin integrity will decrease Outcome: Progressing   Problem: Elimination: Goal: Will not experience complications related to bowel motility Outcome: Progressing   Problem: Safety: Goal: Ability to remain free from injury will improve Outcome: Progressing

## 2023-07-17 NOTE — Progress Notes (Signed)
Mom at bedside with pt. Pt argumentative saying, "get the fuck out of here. I ain't taking that damn medicine." Educated pt on medication (Sensipar) but pt adamantly refused.

## 2023-07-17 NOTE — Evaluation (Signed)
Physical Therapy Evaluation Patient Details Name: [name] MRN: [id number] DOB: 1981/09/18 Today's Date: 07/17/2023  History of Present Illness  Patient is a 42 y/o male admitted 07/09/23 with confusion found to be in septic shock found to have MRSA in blood cultures, in DKA, TEE positive for vegitation on multiple valves, MRI showing R MCA infarct.  PMH positive for ESRD on HD, IDDM, gastroparesis, L AKA, HTN.  Clinical Impression  Patient presents with decreased mobility due to generalized weakness/malaise, L side hemiparesis, decreased sitting balance, decreased activity tolerance and decreased cognition.  Previously able to transfer himself to wheelchair and had transport to dialysis.  Patient would not state to what degree if any his mother assisted him at home.  Currently max A up to EOB and mod to max A for sitting balance with pt slow to initiate and to communicate when offered his cold breakfast tray.  Finally communicating need for a new tray and assisted to order.  Patient will need continued skilled PT in the acute setting and follow up inpatient rehab (<3 hours/day) at d/c.         If plan is discharge home, recommend the following: Two people to help with walking and/or transfers;Assist for transportation;Supervision due to cognitive status;Two people to help with bathing/dressing/bathroom;Help with stairs or ramp for entrance   Can travel by private vehicle   No    Equipment Recommendations Other (comment) (TBA)  Recommendations for Other Services       Functional Status Assessment Patient has had a recent decline in their functional status and demonstrates the ability to make significant improvements in function in a reasonable and predictable amount of time.     Precautions / Restrictions Precautions Precautions: Fall Precaution Comments: L groin HD access, LAKA, L hemiparesis Restrictions Weight Bearing Restrictions: No      Mobility  Bed Mobility Overal  bed mobility: Needs Assistance Bed Mobility: Rolling, Sidelying to Sit, Sit to Supine Rolling: Mod assist Sidelying to sit: HOB elevated, Used rails, Mod assist       General bed mobility comments: able to bring R foot off EOB and use rail on R with assist to lift trunk and scoot L hip toward EOB; to supine pt able to lift R foot, assist to scoot hips to middle of bed; pt encouraged and used headboard with bed in trendelenberg to scoot up with A on L side.    Transfers                   General transfer comment: NT with +1 A    Ambulation/Gait                  Stairs            Wheelchair Mobility     Tilt Bed    Modified Rankin (Stroke Patients Only) Modified Rankin (Stroke Patients Only) Pre-Morbid Rankin Score: Moderate disability Modified Rankin: Severe disability     Balance Overall balance assessment: Needs assistance Sitting-balance support: Feet supported Sitting balance-[name]: Zero Sitting balance - Comments: mod A for balance at least Postural control: Posterior lean                                   Pertinent Vitals/Pain Pain Assessment Pain Assessment: 0-10 Pain Score: 8  Pain Location: headache and generalized Pain Descriptors / Indicators: Headache Pain Intervention(s): Monitored during session, Repositioned, Patient requesting  pain meds-RN notified    Home Living Family/patient expects to be discharged to:: Private residence Living Arrangements: Parent Available Help at Discharge: Family Type of Home: House Home Access: Ramped entrance       Home Layout: One level Home Equipment: Wheelchair - Nurse, learning disability (2 wheels)      Prior Function               Mobility Comments: states able to transfer on his own, pt poor historian       Extremity/Trunk Assessment   Upper Extremity Assessment Upper Extremity Assessment: LUE deficits/detail LUE Deficits / Details: swollen and pt  not moving, PROM grossly WFL though not formally tested    Lower Extremity Assessment Lower Extremity Assessment: LLE deficits/detail LLE Deficits / Details: AKA, pt not moving it in the bed, seated attempting to scoot that side forward, though unable       Communication   Communication Communication: No apparent difficulties  Cognition Arousal: Alert Behavior During Therapy: Flat affect Overall Cognitive Status: Impaired/Different from baseline Area of Impairment: Following commands, Problem solving, Safety/judgement, Attention                   Current Attention Level: Sustained   Following Commands: Follows one step commands with increased time, Follows one step commands inconsistently Safety/Judgement: Decreased awareness of deficits, Decreased awareness of safety   Problem Solving: Slow processing, Decreased initiation, Difficulty sequencing, Requires verbal cues, Requires tactile cues General Comments: very slow to initiate anything with breakfast tray in front of him, finally communicated would not eat cold food and pt able to state preferences to order new tray.  Did state need for warm blanket, pain med and more grape juice, though very slow to follow commands and somewhat flat affect        General Comments General comments (skin integrity, edema, etc.): L femoral cath for HD, noted R foot hitting footboard despite assisted to scoot up, but pt scooting back down and foot hitting footboard with pt pushing into foot so padded with bed pad taped to foot board, RN aware    Exercises     Assessment/Plan    PT Assessment Patient needs continued PT services  PT Problem List Decreased strength;Decreased balance;Decreased cognition;Decreased mobility;Decreased knowledge of use of DME;Decreased activity tolerance;Decreased safety awareness;Impaired tone       PT Treatment Interventions DME instruction;Functional mobility training;Balance training;Patient/family  education;Therapeutic activities;Neuromuscular re-education;Therapeutic exercise;Cognitive remediation;Wheelchair mobility training    PT Goals (Current goals can be found in the Care Plan section)  Acute Rehab PT Goals Patient Stated Goal: none stated PT Goal Formulation: Patient unable to participate in goal setting Time For Goal Achievement: 07/30/23 Potential to Achieve Goals: Fair    Frequency Min 1X/week     Co-evaluation               AM-PAC PT "6 Clicks" Mobility  Outcome Measure Help needed turning from your back to your side while in a flat bed without using bedrails?: Total Help needed moving from lying on your back to sitting on the side of a flat bed without using bedrails?: Total Help needed moving to and from a bed to a chair (including a wheelchair)?: Total Help needed standing up from a chair using your arms (e.g., wheelchair or bedside chair)?: Total Help needed to walk in hospital room?: Total Help needed climbing 3-5 steps with a railing? : Total 6 Click Score: 6    End of Session Equipment Utilized During Treatment:  Oxygen Activity Tolerance: Patient tolerated treatment well Patient left: in bed;with call bell/phone within reach;with bed alarm set Nurse Communication: Patient requests pain meds PT Visit Diagnosis: Muscle weakness (generalized) (M62.81);Other abnormalities of gait and mobility (R26.89);Other symptoms and signs involving the nervous system (R29.898)    Time: 1601-0932 PT Time Calculation (min) (ACUTE ONLY): 33 min   Charges:   PT Evaluation $PT Eval Moderate Complexity: 1 Mod PT Treatments $Therapeutic Activity: 8-22 mins PT General Charges $$ ACUTE PT VISIT: 1 Visit         Sheran Lawless, PT Acute Rehabilitation Services Office:[phone] 07/17/2023   Elray Mcgregor 07/17/2023, 1:12 PM

## 2023-07-17 NOTE — Progress Notes (Signed)
Routine X 2) w Reflex to ID Panel     Status: None   Collection Time: 07/11/23  8:44 AM   Specimen: BLOOD RIGHT ARM  Result Value Ref Range Status   Specimen Description BLOOD RIGHT ARM  Final   Special Requests   Final    BOTTLES DRAWN AEROBIC ONLY Blood Culture adequate volume   Culture   Final    NO GROWTH 5 DAYS Performed at [facility], [address] [place], [name] [address]    Report Status 07/16/2023 FINAL  Final  Culture, blood (Routine X 2) w Reflex to ID Panel     Status: None (Preliminary result)   Collection Time:  07/15/23  3:00 PM   Specimen: BLOOD RIGHT ARM  Result Value Ref Range Status   Specimen Description BLOOD RIGHT ARM  Final   Special Requests   Final    BOTTLES DRAWN AEROBIC AND ANAEROBIC Blood Culture adequate volume   Culture   Final    NO GROWTH 2 DAYS Performed at [facility], [address], [place], [name] [address]    Report Status PENDING  Incomplete  Culture, blood (Routine X 2) w Reflex to ID Panel     Status: None (Preliminary result)   Collection Time: 07/15/23  3:02 PM   Specimen: BLOOD RIGHT ARM  Result Value Ref Range Status   Specimen Description BLOOD RIGHT ARM  Final   Special Requests   Final    BOTTLES DRAWN AEROBIC AND ANAEROBIC Blood Culture adequate volume   Culture   Final    NO GROWTH 2 DAYS Performed at [facility], [address], [place], [name] [address]    Report Status PENDING  Incomplete     Radiology Studies: VAS US CAROTID  Result Date: 07/16/2023 Carotid Arterial Duplex Study Patient Name:  [name]  Date of Exam:   07/16/2023 Medical Rec #: [id number]      Accession #:    [id number] Date of Birth: Jan 21, 1981     Patient Gender: M Patient Age:   42 years Exam Location:  [facility] Procedure:      VAS US CAROTID Referring Phys: Samuel Mcpeek --------------------------------------------------------------------------------  Indications:   CVA. Risk Factors:  Hypertension, Diabetes, no history of smoking, prior CVA. 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 Limitations    Today's exam was limited due to the patient's inability or                unwillingness to cooperate, the patient's respiratory variation                and Cath in IJ, unable to visualize right subclavian. Performing Technologist: Dondra Prader RVT, RCS  Examination Guidelines: A complete evaluation includes B-mode imaging, spectral Doppler, color Doppler, and power Doppler as needed of all accessible portions of each vessel. Bilateral testing is considered an  integral part of a complete examination. Limited examinations for reoccurring indications may be performed as noted.  Right Carotid Findings: +----------+--------+--------+--------+------------------+--------+           PSV cm/sEDV cm/sStenosisPlaque DescriptionComments +----------+--------+--------+--------+------------------+--------+ CCA Prox  86      18                                         +----------+--------+--------+--------+------------------+--------+ CCA Mid   58      10                                         +----------+--------+--------+--------+------------------+--------+  Routine X 2) w Reflex to ID Panel     Status: None   Collection Time: 07/11/23  8:44 AM   Specimen: BLOOD RIGHT ARM  Result Value Ref Range Status   Specimen Description BLOOD RIGHT ARM  Final   Special Requests   Final    BOTTLES DRAWN AEROBIC ONLY Blood Culture adequate volume   Culture   Final    NO GROWTH 5 DAYS Performed at [facility], [address] [place], [name] [address]    Report Status 07/16/2023 FINAL  Final  Culture, blood (Routine X 2) w Reflex to ID Panel     Status: None (Preliminary result)   Collection Time:  07/15/23  3:00 PM   Specimen: BLOOD RIGHT ARM  Result Value Ref Range Status   Specimen Description BLOOD RIGHT ARM  Final   Special Requests   Final    BOTTLES DRAWN AEROBIC AND ANAEROBIC Blood Culture adequate volume   Culture   Final    NO GROWTH 2 DAYS Performed at [facility], [address], [place], [name] [address]    Report Status PENDING  Incomplete  Culture, blood (Routine X 2) w Reflex to ID Panel     Status: None (Preliminary result)   Collection Time: 07/15/23  3:02 PM   Specimen: BLOOD RIGHT ARM  Result Value Ref Range Status   Specimen Description BLOOD RIGHT ARM  Final   Special Requests   Final    BOTTLES DRAWN AEROBIC AND ANAEROBIC Blood Culture adequate volume   Culture   Final    NO GROWTH 2 DAYS Performed at [facility], [address], [place], [name] [address]    Report Status PENDING  Incomplete     Radiology Studies: VAS US CAROTID  Result Date: 07/16/2023 Carotid Arterial Duplex Study Patient Name:  [name]  Date of Exam:   07/16/2023 Medical Rec #: [id number]      Accession #:    [id number] Date of Birth: Jan 21, 1981     Patient Gender: M Patient Age:   42 years Exam Location:  [facility] Procedure:      VAS US CAROTID Referring Phys: Samuel Mcpeek --------------------------------------------------------------------------------  Indications:   CVA. Risk Factors:  Hypertension, Diabetes, no history of smoking, prior CVA. 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 Limitations    Today's exam was limited due to the patient's inability or                unwillingness to cooperate, the patient's respiratory variation                and Cath in IJ, unable to visualize right subclavian. Performing Technologist: Dondra Prader RVT, RCS  Examination Guidelines: A complete evaluation includes B-mode imaging, spectral Doppler, color Doppler, and power Doppler as needed of all accessible portions of each vessel. Bilateral testing is considered an  integral part of a complete examination. Limited examinations for reoccurring indications may be performed as noted.  Right Carotid Findings: +----------+--------+--------+--------+------------------+--------+           PSV cm/sEDV cm/sStenosisPlaque DescriptionComments +----------+--------+--------+--------+------------------+--------+ CCA Prox  86      18                                         +----------+--------+--------+--------+------------------+--------+ CCA Mid   58      10                                         +----------+--------+--------+--------+------------------+--------+  PROGRESS NOTE  [name]  ZOX:[id number] DOB: [birth year] DOA: 07/09/2023 PCP: [name], NP   Brief Narrative: Patient is a 42 year old male with history of ESRD on dialysis, insulin-dependent diabetes mellitus, gastroparesis, left AKA, hypertension who was brought here on 10/11 with altered mental status.  He was recently admitted here on July for sepsis secondary to right diabetic foot ulcer, cultures grew MRSA.  Dialysis catheter was removed and replaced.  He was treated with vancomycin, refused echo and left on 7/7 against  medical advice.  On 10/11, he was found to be confused and brought to ED.  When he was brought in, dialysis catheter was partially removed.  He was also found to be in DKA.  He started on insulin drip.  Lab work showed elevated leukocytes, lactate of 2.4.  He was hypotensive.  Admitted for septic shock.  Hospital course remarkable for finding of MRSA in blood cultures.  ID following.  Nephrology following for dialysis.  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  TEE done on 10/16 showed vegetation on multiple valves including big one on mitral valve, cardiothoracic surgery consulted.  Plan for conservative treatment with antibiotics.  MRI of the brain done on 10/17 showed sizable right MCA branch infarct.  Palliative care consulted today for goals of care  Assessment & Plan:  Principal Problem:   Endocarditis of mitral valve Active Problems:   DKA (diabetic ketoacidosis) ([name])   Altered mental status   MRSA bacteremia   Hemodialysis catheter infection ([name])   Right elbow pain   ESRD on hemodialysis ([name])   Acute systolic CHF (congestive heart failure) ([name])   DCM (dilated cardiomyopathy) ([name])   Coronary artery calcification seen on CAT scan   Infective endocarditis of tricuspid valve  Septic shock due to MRSA bacteremia: Was admitted on July and was found to have MRSA bacteremia and left AMA.  Presented with septic shock with  hypotension, leukocytosis, encephalopathy.  Blood cultures again showed MRSA.  Currently on vancomycin.  Suspected HD line infection.ID following. Afebrile this morning  Infective endocarditis: TEE done on 10/16 showed  vegetations on multiple valves but mainly in the mitral valve.  Cardiothoracic surgery consulted, recommending conservative management with antibiotics for 6 weeks, repeat TEE in 1 to 2 weeks. He may need to finish the antibiotics in the hospital  Acute ischemic stroke/septic infarct:: Patient noticed to have weakness on the left upper extremity.  MRI of the brain done on 10/17 showed sizable right MCA branch infarct.  Likely septic infarct.  Neurology following.  MRI of the brain showed poor signal on the right M2 branch ,could not rule out stenosis.  Flaccid weakness in the left upper extremity persists.  Neurology signed off, recommend outpatient follow-up.  Normal antithrombotics in the setting of infective endocarditis  Combined systolic/diastolic CHF: Echo showed EF of 25 to 30%, severe decrease left ventricular function, global hypokinesis, grade 1 diastolic dysfunction.  2D echo showed filamentous structure on the ventricular side of the mitral valve, thickened aortic valve leaflets/annulus .  His last echo on 03/2019 showed normal ejection fraction. TEE showed multiple vegetations. He remains euvolemic today.  He needs to follow-up with advanced heart failure team as an outpatient.  DKA: A1c of 8.9.  Initially started on insulin drip, now transition to long-acting and sliding scale.  Monitor blood sugars.  Diabetic coordinator following.  Sugars fluctuating  Acute encephalopathy/left upper extremity weakness: Likely secondary to stroke, sepsis, DKA and hospital-acquired delirium..  Mental status improving.  Routine X 2) w Reflex to ID Panel     Status: None   Collection Time: 07/11/23  8:44 AM   Specimen: BLOOD RIGHT ARM  Result Value Ref Range Status   Specimen Description BLOOD RIGHT ARM  Final   Special Requests   Final    BOTTLES DRAWN AEROBIC ONLY Blood Culture adequate volume   Culture   Final    NO GROWTH 5 DAYS Performed at [facility], [address] [place], [name] [address]    Report Status 07/16/2023 FINAL  Final  Culture, blood (Routine X 2) w Reflex to ID Panel     Status: None (Preliminary result)   Collection Time:  07/15/23  3:00 PM   Specimen: BLOOD RIGHT ARM  Result Value Ref Range Status   Specimen Description BLOOD RIGHT ARM  Final   Special Requests   Final    BOTTLES DRAWN AEROBIC AND ANAEROBIC Blood Culture adequate volume   Culture   Final    NO GROWTH 2 DAYS Performed at [facility], [address], [place], [name] [address]    Report Status PENDING  Incomplete  Culture, blood (Routine X 2) w Reflex to ID Panel     Status: None (Preliminary result)   Collection Time: 07/15/23  3:02 PM   Specimen: BLOOD RIGHT ARM  Result Value Ref Range Status   Specimen Description BLOOD RIGHT ARM  Final   Special Requests   Final    BOTTLES DRAWN AEROBIC AND ANAEROBIC Blood Culture adequate volume   Culture   Final    NO GROWTH 2 DAYS Performed at [facility], [address], [place], [name] [address]    Report Status PENDING  Incomplete     Radiology Studies: VAS US CAROTID  Result Date: 07/16/2023 Carotid Arterial Duplex Study Patient Name:  [name]  Date of Exam:   07/16/2023 Medical Rec #: [id number]      Accession #:    [id number] Date of Birth: Jan 21, 1981     Patient Gender: M Patient Age:   42 years Exam Location:  [facility] Procedure:      VAS US CAROTID Referring Phys: Samuel Mcpeek --------------------------------------------------------------------------------  Indications:   CVA. Risk Factors:  Hypertension, Diabetes, no history of smoking, prior CVA. 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 Limitations    Today's exam was limited due to the patient's inability or                unwillingness to cooperate, the patient's respiratory variation                and Cath in IJ, unable to visualize right subclavian. Performing Technologist: Dondra Prader RVT, RCS  Examination Guidelines: A complete evaluation includes B-mode imaging, spectral Doppler, color Doppler, and power Doppler as needed of all accessible portions of each vessel. Bilateral testing is considered an  integral part of a complete examination. Limited examinations for reoccurring indications may be performed as noted.  Right Carotid Findings: +----------+--------+--------+--------+------------------+--------+           PSV cm/sEDV cm/sStenosisPlaque DescriptionComments +----------+--------+--------+--------+------------------+--------+ CCA Prox  86      18                                         +----------+--------+--------+--------+------------------+--------+ CCA Mid   58      10                                         +----------+--------+--------+--------+------------------+--------+  PROGRESS NOTE  [name]  ZOX:[id number] DOB: [birth year] DOA: 07/09/2023 PCP: [name], NP   Brief Narrative: Patient is a 42 year old male with history of ESRD on dialysis, insulin-dependent diabetes mellitus, gastroparesis, left AKA, hypertension who was brought here on 10/11 with altered mental status.  He was recently admitted here on July for sepsis secondary to right diabetic foot ulcer, cultures grew MRSA.  Dialysis catheter was removed and replaced.  He was treated with vancomycin, refused echo and left on 7/7 against  medical advice.  On 10/11, he was found to be confused and brought to ED.  When he was brought in, dialysis catheter was partially removed.  He was also found to be in DKA.  He started on insulin drip.  Lab work showed elevated leukocytes, lactate of 2.4.  He was hypotensive.  Admitted for septic shock.  Hospital course remarkable for finding of MRSA in blood cultures.  ID following.  Nephrology following for dialysis.  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  TEE done on 10/16 showed vegetation on multiple valves including big one on mitral valve, cardiothoracic surgery consulted.  Plan for conservative treatment with antibiotics.  MRI of the brain done on 10/17 showed sizable right MCA branch infarct.  Palliative care consulted today for goals of care  Assessment & Plan:  Principal Problem:   Endocarditis of mitral valve Active Problems:   DKA (diabetic ketoacidosis) ([name])   Altered mental status   MRSA bacteremia   Hemodialysis catheter infection ([name])   Right elbow pain   ESRD on hemodialysis ([name])   Acute systolic CHF (congestive heart failure) ([name])   DCM (dilated cardiomyopathy) ([name])   Coronary artery calcification seen on CAT scan   Infective endocarditis of tricuspid valve  Septic shock due to MRSA bacteremia: Was admitted on July and was found to have MRSA bacteremia and left AMA.  Presented with septic shock with  hypotension, leukocytosis, encephalopathy.  Blood cultures again showed MRSA.  Currently on vancomycin.  Suspected HD line infection.ID following. Afebrile this morning  Infective endocarditis: TEE done on 10/16 showed  vegetations on multiple valves but mainly in the mitral valve.  Cardiothoracic surgery consulted, recommending conservative management with antibiotics for 6 weeks, repeat TEE in 1 to 2 weeks. He may need to finish the antibiotics in the hospital  Acute ischemic stroke/septic infarct:: Patient noticed to have weakness on the left upper extremity.  MRI of the brain done on 10/17 showed sizable right MCA branch infarct.  Likely septic infarct.  Neurology following.  MRI of the brain showed poor signal on the right M2 branch ,could not rule out stenosis.  Flaccid weakness in the left upper extremity persists.  Neurology signed off, recommend outpatient follow-up.  Normal antithrombotics in the setting of infective endocarditis  Combined systolic/diastolic CHF: Echo showed EF of 25 to 30%, severe decrease left ventricular function, global hypokinesis, grade 1 diastolic dysfunction.  2D echo showed filamentous structure on the ventricular side of the mitral valve, thickened aortic valve leaflets/annulus .  His last echo on 03/2019 showed normal ejection fraction. TEE showed multiple vegetations. He remains euvolemic today.  He needs to follow-up with advanced heart failure team as an outpatient.  DKA: A1c of 8.9.  Initially started on insulin drip, now transition to long-acting and sliding scale.  Monitor blood sugars.  Diabetic coordinator following.  Sugars fluctuating  Acute encephalopathy/left upper extremity weakness: Likely secondary to stroke, sepsis, DKA and hospital-acquired delirium..  Mental status improving.  PROGRESS NOTE  [name]  ZOX:[id number] DOB: [birth year] DOA: 07/09/2023 PCP: [name], NP   Brief Narrative: Patient is a 42 year old male with history of ESRD on dialysis, insulin-dependent diabetes mellitus, gastroparesis, left AKA, hypertension who was brought here on 10/11 with altered mental status.  He was recently admitted here on July for sepsis secondary to right diabetic foot ulcer, cultures grew MRSA.  Dialysis catheter was removed and replaced.  He was treated with vancomycin, refused echo and left on 7/7 against  medical advice.  On 10/11, he was found to be confused and brought to ED.  When he was brought in, dialysis catheter was partially removed.  He was also found to be in DKA.  He started on insulin drip.  Lab work showed elevated leukocytes, lactate of 2.4.  He was hypotensive.  Admitted for septic shock.  Hospital course remarkable for finding of MRSA in blood cultures.  ID following.  Nephrology following for dialysis.  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  TEE done on 10/16 showed vegetation on multiple valves including big one on mitral valve, cardiothoracic surgery consulted.  Plan for conservative treatment with antibiotics.  MRI of the brain done on 10/17 showed sizable right MCA branch infarct.  Palliative care consulted today for goals of care  Assessment & Plan:  Principal Problem:   Endocarditis of mitral valve Active Problems:   DKA (diabetic ketoacidosis) ([name])   Altered mental status   MRSA bacteremia   Hemodialysis catheter infection ([name])   Right elbow pain   ESRD on hemodialysis ([name])   Acute systolic CHF (congestive heart failure) ([name])   DCM (dilated cardiomyopathy) ([name])   Coronary artery calcification seen on CAT scan   Infective endocarditis of tricuspid valve  Septic shock due to MRSA bacteremia: Was admitted on July and was found to have MRSA bacteremia and left AMA.  Presented with septic shock with  hypotension, leukocytosis, encephalopathy.  Blood cultures again showed MRSA.  Currently on vancomycin.  Suspected HD line infection.ID following. Afebrile this morning  Infective endocarditis: TEE done on 10/16 showed  vegetations on multiple valves but mainly in the mitral valve.  Cardiothoracic surgery consulted, recommending conservative management with antibiotics for 6 weeks, repeat TEE in 1 to 2 weeks. He may need to finish the antibiotics in the hospital  Acute ischemic stroke/septic infarct:: Patient noticed to have weakness on the left upper extremity.  MRI of the brain done on 10/17 showed sizable right MCA branch infarct.  Likely septic infarct.  Neurology following.  MRI of the brain showed poor signal on the right M2 branch ,could not rule out stenosis.  Flaccid weakness in the left upper extremity persists.  Neurology signed off, recommend outpatient follow-up.  Normal antithrombotics in the setting of infective endocarditis  Combined systolic/diastolic CHF: Echo showed EF of 25 to 30%, severe decrease left ventricular function, global hypokinesis, grade 1 diastolic dysfunction.  2D echo showed filamentous structure on the ventricular side of the mitral valve, thickened aortic valve leaflets/annulus .  His last echo on 03/2019 showed normal ejection fraction. TEE showed multiple vegetations. He remains euvolemic today.  He needs to follow-up with advanced heart failure team as an outpatient.  DKA: A1c of 8.9.  Initially started on insulin drip, now transition to long-acting and sliding scale.  Monitor blood sugars.  Diabetic coordinator following.  Sugars fluctuating  Acute encephalopathy/left upper extremity weakness: Likely secondary to stroke, sepsis, DKA and hospital-acquired delirium..  Mental status improving.  Routine X 2) w Reflex to ID Panel     Status: None   Collection Time: 07/11/23  8:44 AM   Specimen: BLOOD RIGHT ARM  Result Value Ref Range Status   Specimen Description BLOOD RIGHT ARM  Final   Special Requests   Final    BOTTLES DRAWN AEROBIC ONLY Blood Culture adequate volume   Culture   Final    NO GROWTH 5 DAYS Performed at [facility], [address] [place], [name] [address]    Report Status 07/16/2023 FINAL  Final  Culture, blood (Routine X 2) w Reflex to ID Panel     Status: None (Preliminary result)   Collection Time:  07/15/23  3:00 PM   Specimen: BLOOD RIGHT ARM  Result Value Ref Range Status   Specimen Description BLOOD RIGHT ARM  Final   Special Requests   Final    BOTTLES DRAWN AEROBIC AND ANAEROBIC Blood Culture adequate volume   Culture   Final    NO GROWTH 2 DAYS Performed at [facility], [address], [place], [name] [address]    Report Status PENDING  Incomplete  Culture, blood (Routine X 2) w Reflex to ID Panel     Status: None (Preliminary result)   Collection Time: 07/15/23  3:02 PM   Specimen: BLOOD RIGHT ARM  Result Value Ref Range Status   Specimen Description BLOOD RIGHT ARM  Final   Special Requests   Final    BOTTLES DRAWN AEROBIC AND ANAEROBIC Blood Culture adequate volume   Culture   Final    NO GROWTH 2 DAYS Performed at [facility], [address], [place], [name] [address]    Report Status PENDING  Incomplete     Radiology Studies: VAS US CAROTID  Result Date: 07/16/2023 Carotid Arterial Duplex Study Patient Name:  [name]  Date of Exam:   07/16/2023 Medical Rec #: [id number]      Accession #:    [id number] Date of Birth: Jan 21, 1981     Patient Gender: M Patient Age:   42 years Exam Location:  [facility] Procedure:      VAS US CAROTID Referring Phys: Samuel Mcpeek --------------------------------------------------------------------------------  Indications:   CVA. Risk Factors:  Hypertension, Diabetes, no history of smoking, prior CVA. 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 Limitations    Today's exam was limited due to the patient's inability or                unwillingness to cooperate, the patient's respiratory variation                and Cath in IJ, unable to visualize right subclavian. Performing Technologist: Dondra Prader RVT, RCS  Examination Guidelines: A complete evaluation includes B-mode imaging, spectral Doppler, color Doppler, and power Doppler as needed of all accessible portions of each vessel. Bilateral testing is considered an  integral part of a complete examination. Limited examinations for reoccurring indications may be performed as noted.  Right Carotid Findings: +----------+--------+--------+--------+------------------+--------+           PSV cm/sEDV cm/sStenosisPlaque DescriptionComments +----------+--------+--------+--------+------------------+--------+ CCA Prox  86      18                                         +----------+--------+--------+--------+------------------+--------+ CCA Mid   58      10                                         +----------+--------+--------+--------+------------------+--------+  Routine X 2) w Reflex to ID Panel     Status: None   Collection Time: 07/11/23  8:44 AM   Specimen: BLOOD RIGHT ARM  Result Value Ref Range Status   Specimen Description BLOOD RIGHT ARM  Final   Special Requests   Final    BOTTLES DRAWN AEROBIC ONLY Blood Culture adequate volume   Culture   Final    NO GROWTH 5 DAYS Performed at [facility], [address] [place], [name] [address]    Report Status 07/16/2023 FINAL  Final  Culture, blood (Routine X 2) w Reflex to ID Panel     Status: None (Preliminary result)   Collection Time:  07/15/23  3:00 PM   Specimen: BLOOD RIGHT ARM  Result Value Ref Range Status   Specimen Description BLOOD RIGHT ARM  Final   Special Requests   Final    BOTTLES DRAWN AEROBIC AND ANAEROBIC Blood Culture adequate volume   Culture   Final    NO GROWTH 2 DAYS Performed at [facility], [address], [place], [name] [address]    Report Status PENDING  Incomplete  Culture, blood (Routine X 2) w Reflex to ID Panel     Status: None (Preliminary result)   Collection Time: 07/15/23  3:02 PM   Specimen: BLOOD RIGHT ARM  Result Value Ref Range Status   Specimen Description BLOOD RIGHT ARM  Final   Special Requests   Final    BOTTLES DRAWN AEROBIC AND ANAEROBIC Blood Culture adequate volume   Culture   Final    NO GROWTH 2 DAYS Performed at [facility], [address], [place], [name] [address]    Report Status PENDING  Incomplete     Radiology Studies: VAS US CAROTID  Result Date: 07/16/2023 Carotid Arterial Duplex Study Patient Name:  [name]  Date of Exam:   07/16/2023 Medical Rec #: [id number]      Accession #:    [id number] Date of Birth: Jan 21, 1981     Patient Gender: M Patient Age:   42 years Exam Location:  [facility] Procedure:      VAS US CAROTID Referring Phys: Samuel Mcpeek --------------------------------------------------------------------------------  Indications:   CVA. Risk Factors:  Hypertension, Diabetes, no history of smoking, prior CVA. 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 Limitations    Today's exam was limited due to the patient's inability or                unwillingness to cooperate, the patient's respiratory variation                and Cath in IJ, unable to visualize right subclavian. Performing Technologist: Dondra Prader RVT, RCS  Examination Guidelines: A complete evaluation includes B-mode imaging, spectral Doppler, color Doppler, and power Doppler as needed of all accessible portions of each vessel. Bilateral testing is considered an  integral part of a complete examination. Limited examinations for reoccurring indications may be performed as noted.  Right Carotid Findings: +----------+--------+--------+--------+------------------+--------+           PSV cm/sEDV cm/sStenosisPlaque DescriptionComments +----------+--------+--------+--------+------------------+--------+ CCA Prox  86      18                                         +----------+--------+--------+--------+------------------+--------+ CCA Mid   58      10                                         +----------+--------+--------+--------+------------------+--------+  PROGRESS NOTE  [name]  ZOX:[id number] DOB: [birth year] DOA: 07/09/2023 PCP: [name], NP   Brief Narrative: Patient is a 42 year old male with history of ESRD on dialysis, insulin-dependent diabetes mellitus, gastroparesis, left AKA, hypertension who was brought here on 10/11 with altered mental status.  He was recently admitted here on July for sepsis secondary to right diabetic foot ulcer, cultures grew MRSA.  Dialysis catheter was removed and replaced.  He was treated with vancomycin, refused echo and left on 7/7 against  medical advice.  On 10/11, he was found to be confused and brought to ED.  When he was brought in, dialysis catheter was partially removed.  He was also found to be in DKA.  He started on insulin drip.  Lab work showed elevated leukocytes, lactate of 2.4.  He was hypotensive.  Admitted for septic shock.  Hospital course remarkable for finding of MRSA in blood cultures.  ID following.  Nephrology following for dialysis.  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  TEE done on 10/16 showed vegetation on multiple valves including big one on mitral valve, cardiothoracic surgery consulted.  Plan for conservative treatment with antibiotics.  MRI of the brain done on 10/17 showed sizable right MCA branch infarct.  Palliative care consulted today for goals of care  Assessment & Plan:  Principal Problem:   Endocarditis of mitral valve Active Problems:   DKA (diabetic ketoacidosis) ([name])   Altered mental status   MRSA bacteremia   Hemodialysis catheter infection ([name])   Right elbow pain   ESRD on hemodialysis ([name])   Acute systolic CHF (congestive heart failure) ([name])   DCM (dilated cardiomyopathy) ([name])   Coronary artery calcification seen on CAT scan   Infective endocarditis of tricuspid valve  Septic shock due to MRSA bacteremia: Was admitted on July and was found to have MRSA bacteremia and left AMA.  Presented with septic shock with  hypotension, leukocytosis, encephalopathy.  Blood cultures again showed MRSA.  Currently on vancomycin.  Suspected HD line infection.ID following. Afebrile this morning  Infective endocarditis: TEE done on 10/16 showed  vegetations on multiple valves but mainly in the mitral valve.  Cardiothoracic surgery consulted, recommending conservative management with antibiotics for 6 weeks, repeat TEE in 1 to 2 weeks. He may need to finish the antibiotics in the hospital  Acute ischemic stroke/septic infarct:: Patient noticed to have weakness on the left upper extremity.  MRI of the brain done on 10/17 showed sizable right MCA branch infarct.  Likely septic infarct.  Neurology following.  MRI of the brain showed poor signal on the right M2 branch ,could not rule out stenosis.  Flaccid weakness in the left upper extremity persists.  Neurology signed off, recommend outpatient follow-up.  Normal antithrombotics in the setting of infective endocarditis  Combined systolic/diastolic CHF: Echo showed EF of 25 to 30%, severe decrease left ventricular function, global hypokinesis, grade 1 diastolic dysfunction.  2D echo showed filamentous structure on the ventricular side of the mitral valve, thickened aortic valve leaflets/annulus .  His last echo on 03/2019 showed normal ejection fraction. TEE showed multiple vegetations. He remains euvolemic today.  He needs to follow-up with advanced heart failure team as an outpatient.  DKA: A1c of 8.9.  Initially started on insulin drip, now transition to long-acting and sliding scale.  Monitor blood sugars.  Diabetic coordinator following.  Sugars fluctuating  Acute encephalopathy/left upper extremity weakness: Likely secondary to stroke, sepsis, DKA and hospital-acquired delirium..  Mental status improving.  PROGRESS NOTE  [name]  ZOX:[id number] DOB: [birth year] DOA: 07/09/2023 PCP: [name], NP   Brief Narrative: Patient is a 42 year old male with history of ESRD on dialysis, insulin-dependent diabetes mellitus, gastroparesis, left AKA, hypertension who was brought here on 10/11 with altered mental status.  He was recently admitted here on July for sepsis secondary to right diabetic foot ulcer, cultures grew MRSA.  Dialysis catheter was removed and replaced.  He was treated with vancomycin, refused echo and left on 7/7 against  medical advice.  On 10/11, he was found to be confused and brought to ED.  When he was brought in, dialysis catheter was partially removed.  He was also found to be in DKA.  He started on insulin drip.  Lab work showed elevated leukocytes, lactate of 2.4.  He was hypotensive.  Admitted for septic shock.  Hospital course remarkable for finding of MRSA in blood cultures.  ID following.  Nephrology following for dialysis.  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  TEE done on 10/16 showed vegetation on multiple valves including big one on mitral valve, cardiothoracic surgery consulted.  Plan for conservative treatment with antibiotics.  MRI of the brain done on 10/17 showed sizable right MCA branch infarct.  Palliative care consulted today for goals of care  Assessment & Plan:  Principal Problem:   Endocarditis of mitral valve Active Problems:   DKA (diabetic ketoacidosis) ([name])   Altered mental status   MRSA bacteremia   Hemodialysis catheter infection ([name])   Right elbow pain   ESRD on hemodialysis ([name])   Acute systolic CHF (congestive heart failure) ([name])   DCM (dilated cardiomyopathy) ([name])   Coronary artery calcification seen on CAT scan   Infective endocarditis of tricuspid valve  Septic shock due to MRSA bacteremia: Was admitted on July and was found to have MRSA bacteremia and left AMA.  Presented with septic shock with  hypotension, leukocytosis, encephalopathy.  Blood cultures again showed MRSA.  Currently on vancomycin.  Suspected HD line infection.ID following. Afebrile this morning  Infective endocarditis: TEE done on 10/16 showed  vegetations on multiple valves but mainly in the mitral valve.  Cardiothoracic surgery consulted, recommending conservative management with antibiotics for 6 weeks, repeat TEE in 1 to 2 weeks. He may need to finish the antibiotics in the hospital  Acute ischemic stroke/septic infarct:: Patient noticed to have weakness on the left upper extremity.  MRI of the brain done on 10/17 showed sizable right MCA branch infarct.  Likely septic infarct.  Neurology following.  MRI of the brain showed poor signal on the right M2 branch ,could not rule out stenosis.  Flaccid weakness in the left upper extremity persists.  Neurology signed off, recommend outpatient follow-up.  Normal antithrombotics in the setting of infective endocarditis  Combined systolic/diastolic CHF: Echo showed EF of 25 to 30%, severe decrease left ventricular function, global hypokinesis, grade 1 diastolic dysfunction.  2D echo showed filamentous structure on the ventricular side of the mitral valve, thickened aortic valve leaflets/annulus .  His last echo on 03/2019 showed normal ejection fraction. TEE showed multiple vegetations. He remains euvolemic today.  He needs to follow-up with advanced heart failure team as an outpatient.  DKA: A1c of 8.9.  Initially started on insulin drip, now transition to long-acting and sliding scale.  Monitor blood sugars.  Diabetic coordinator following.  Sugars fluctuating  Acute encephalopathy/left upper extremity weakness: Likely secondary to stroke, sepsis, DKA and hospital-acquired delirium..  Mental status improving.  PROGRESS NOTE  [name]  ZOX:[id number] DOB: [birth year] DOA: 07/09/2023 PCP: [name], NP   Brief Narrative: Patient is a 42 year old male with history of ESRD on dialysis, insulin-dependent diabetes mellitus, gastroparesis, left AKA, hypertension who was brought here on 10/11 with altered mental status.  He was recently admitted here on July for sepsis secondary to right diabetic foot ulcer, cultures grew MRSA.  Dialysis catheter was removed and replaced.  He was treated with vancomycin, refused echo and left on 7/7 against  medical advice.  On 10/11, he was found to be confused and brought to ED.  When he was brought in, dialysis catheter was partially removed.  He was also found to be in DKA.  He started on insulin drip.  Lab work showed elevated leukocytes, lactate of 2.4.  He was hypotensive.  Admitted for septic shock.  Hospital course remarkable for finding of MRSA in blood cultures.  ID following.  Nephrology following for dialysis.  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  TEE done on 10/16 showed vegetation on multiple valves including big one on mitral valve, cardiothoracic surgery consulted.  Plan for conservative treatment with antibiotics.  MRI of the brain done on 10/17 showed sizable right MCA branch infarct.  Palliative care consulted today for goals of care  Assessment & Plan:  Principal Problem:   Endocarditis of mitral valve Active Problems:   DKA (diabetic ketoacidosis) ([name])   Altered mental status   MRSA bacteremia   Hemodialysis catheter infection ([name])   Right elbow pain   ESRD on hemodialysis ([name])   Acute systolic CHF (congestive heart failure) ([name])   DCM (dilated cardiomyopathy) ([name])   Coronary artery calcification seen on CAT scan   Infective endocarditis of tricuspid valve  Septic shock due to MRSA bacteremia: Was admitted on July and was found to have MRSA bacteremia and left AMA.  Presented with septic shock with  hypotension, leukocytosis, encephalopathy.  Blood cultures again showed MRSA.  Currently on vancomycin.  Suspected HD line infection.ID following. Afebrile this morning  Infective endocarditis: TEE done on 10/16 showed  vegetations on multiple valves but mainly in the mitral valve.  Cardiothoracic surgery consulted, recommending conservative management with antibiotics for 6 weeks, repeat TEE in 1 to 2 weeks. He may need to finish the antibiotics in the hospital  Acute ischemic stroke/septic infarct:: Patient noticed to have weakness on the left upper extremity.  MRI of the brain done on 10/17 showed sizable right MCA branch infarct.  Likely septic infarct.  Neurology following.  MRI of the brain showed poor signal on the right M2 branch ,could not rule out stenosis.  Flaccid weakness in the left upper extremity persists.  Neurology signed off, recommend outpatient follow-up.  Normal antithrombotics in the setting of infective endocarditis  Combined systolic/diastolic CHF: Echo showed EF of 25 to 30%, severe decrease left ventricular function, global hypokinesis, grade 1 diastolic dysfunction.  2D echo showed filamentous structure on the ventricular side of the mitral valve, thickened aortic valve leaflets/annulus .  His last echo on 03/2019 showed normal ejection fraction. TEE showed multiple vegetations. He remains euvolemic today.  He needs to follow-up with advanced heart failure team as an outpatient.  DKA: A1c of 8.9.  Initially started on insulin drip, now transition to long-acting and sliding scale.  Monitor blood sugars.  Diabetic coordinator following.  Sugars fluctuating  Acute encephalopathy/left upper extremity weakness: Likely secondary to stroke, sepsis, DKA and hospital-acquired delirium..  Mental status improving.  Routine X 2) w Reflex to ID Panel     Status: None   Collection Time: 07/11/23  8:44 AM   Specimen: BLOOD RIGHT ARM  Result Value Ref Range Status   Specimen Description BLOOD RIGHT ARM  Final   Special Requests   Final    BOTTLES DRAWN AEROBIC ONLY Blood Culture adequate volume   Culture   Final    NO GROWTH 5 DAYS Performed at [facility], [address] [place], [name] [address]    Report Status 07/16/2023 FINAL  Final  Culture, blood (Routine X 2) w Reflex to ID Panel     Status: None (Preliminary result)   Collection Time:  07/15/23  3:00 PM   Specimen: BLOOD RIGHT ARM  Result Value Ref Range Status   Specimen Description BLOOD RIGHT ARM  Final   Special Requests   Final    BOTTLES DRAWN AEROBIC AND ANAEROBIC Blood Culture adequate volume   Culture   Final    NO GROWTH 2 DAYS Performed at [facility], [address], [place], [name] [address]    Report Status PENDING  Incomplete  Culture, blood (Routine X 2) w Reflex to ID Panel     Status: None (Preliminary result)   Collection Time: 07/15/23  3:02 PM   Specimen: BLOOD RIGHT ARM  Result Value Ref Range Status   Specimen Description BLOOD RIGHT ARM  Final   Special Requests   Final    BOTTLES DRAWN AEROBIC AND ANAEROBIC Blood Culture adequate volume   Culture   Final    NO GROWTH 2 DAYS Performed at [facility], [address], [place], [name] [address]    Report Status PENDING  Incomplete     Radiology Studies: VAS US CAROTID  Result Date: 07/16/2023 Carotid Arterial Duplex Study Patient Name:  [name]  Date of Exam:   07/16/2023 Medical Rec #: [id number]      Accession #:    [id number] Date of Birth: Jan 21, 1981     Patient Gender: M Patient Age:   42 years Exam Location:  [facility] Procedure:      VAS US CAROTID Referring Phys: Samuel Mcpeek --------------------------------------------------------------------------------  Indications:   CVA. Risk Factors:  Hypertension, Diabetes, no history of smoking, prior CVA. 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 Limitations    Today's exam was limited due to the patient's inability or                unwillingness to cooperate, the patient's respiratory variation                and Cath in IJ, unable to visualize right subclavian. Performing Technologist: Dondra Prader RVT, RCS  Examination Guidelines: A complete evaluation includes B-mode imaging, spectral Doppler, color Doppler, and power Doppler as needed of all accessible portions of each vessel. Bilateral testing is considered an  integral part of a complete examination. Limited examinations for reoccurring indications may be performed as noted.  Right Carotid Findings: +----------+--------+--------+--------+------------------+--------+           PSV cm/sEDV cm/sStenosisPlaque DescriptionComments +----------+--------+--------+--------+------------------+--------+ CCA Prox  86      18                                         +----------+--------+--------+--------+------------------+--------+ CCA Mid   58      10                                         +----------+--------+--------+--------+------------------+--------+  Routine X 2) w Reflex to ID Panel     Status: None   Collection Time: 07/11/23  8:44 AM   Specimen: BLOOD RIGHT ARM  Result Value Ref Range Status   Specimen Description BLOOD RIGHT ARM  Final   Special Requests   Final    BOTTLES DRAWN AEROBIC ONLY Blood Culture adequate volume   Culture   Final    NO GROWTH 5 DAYS Performed at [facility], [address] [place], [name] [address]    Report Status 07/16/2023 FINAL  Final  Culture, blood (Routine X 2) w Reflex to ID Panel     Status: None (Preliminary result)   Collection Time:  07/15/23  3:00 PM   Specimen: BLOOD RIGHT ARM  Result Value Ref Range Status   Specimen Description BLOOD RIGHT ARM  Final   Special Requests   Final    BOTTLES DRAWN AEROBIC AND ANAEROBIC Blood Culture adequate volume   Culture   Final    NO GROWTH 2 DAYS Performed at [facility], [address], [place], [name] [address]    Report Status PENDING  Incomplete  Culture, blood (Routine X 2) w Reflex to ID Panel     Status: None (Preliminary result)   Collection Time: 07/15/23  3:02 PM   Specimen: BLOOD RIGHT ARM  Result Value Ref Range Status   Specimen Description BLOOD RIGHT ARM  Final   Special Requests   Final    BOTTLES DRAWN AEROBIC AND ANAEROBIC Blood Culture adequate volume   Culture   Final    NO GROWTH 2 DAYS Performed at [facility], [address], [place], [name] [address]    Report Status PENDING  Incomplete     Radiology Studies: VAS US CAROTID  Result Date: 07/16/2023 Carotid Arterial Duplex Study Patient Name:  [name]  Date of Exam:   07/16/2023 Medical Rec #: [id number]      Accession #:    [id number] Date of Birth: Jan 21, 1981     Patient Gender: M Patient Age:   42 years Exam Location:  [facility] Procedure:      VAS US CAROTID Referring Phys: Samuel Mcpeek --------------------------------------------------------------------------------  Indications:   CVA. Risk Factors:  Hypertension, Diabetes, no history of smoking, prior CVA. 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 Limitations    Today's exam was limited due to the patient's inability or                unwillingness to cooperate, the patient's respiratory variation                and Cath in IJ, unable to visualize right subclavian. Performing Technologist: Dondra Prader RVT, RCS  Examination Guidelines: A complete evaluation includes B-mode imaging, spectral Doppler, color Doppler, and power Doppler as needed of all accessible portions of each vessel. Bilateral testing is considered an  integral part of a complete examination. Limited examinations for reoccurring indications may be performed as noted.  Right Carotid Findings: +----------+--------+--------+--------+------------------+--------+           PSV cm/sEDV cm/sStenosisPlaque DescriptionComments +----------+--------+--------+--------+------------------+--------+ CCA Prox  86      18                                         +----------+--------+--------+--------+------------------+--------+ CCA Mid   58      10                                         +----------+--------+--------+--------+------------------+--------+  PROGRESS NOTE  [name]  ZOX:[id number] DOB: [birth year] DOA: 07/09/2023 PCP: [name], NP   Brief Narrative: Patient is a 42 year old male with history of ESRD on dialysis, insulin-dependent diabetes mellitus, gastroparesis, left AKA, hypertension who was brought here on 10/11 with altered mental status.  He was recently admitted here on July for sepsis secondary to right diabetic foot ulcer, cultures grew MRSA.  Dialysis catheter was removed and replaced.  He was treated with vancomycin, refused echo and left on 7/7 against  medical advice.  On 10/11, he was found to be confused and brought to ED.  When he was brought in, dialysis catheter was partially removed.  He was also found to be in DKA.  He started on insulin drip.  Lab work showed elevated leukocytes, lactate of 2.4.  He was hypotensive.  Admitted for septic shock.  Hospital course remarkable for finding of MRSA in blood cultures.  ID following.  Nephrology following for dialysis.  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  TEE done on 10/16 showed vegetation on multiple valves including big one on mitral valve, cardiothoracic surgery consulted.  Plan for conservative treatment with antibiotics.  MRI of the brain done on 10/17 showed sizable right MCA branch infarct.  Palliative care consulted today for goals of care  Assessment & Plan:  Principal Problem:   Endocarditis of mitral valve Active Problems:   DKA (diabetic ketoacidosis) ([name])   Altered mental status   MRSA bacteremia   Hemodialysis catheter infection ([name])   Right elbow pain   ESRD on hemodialysis ([name])   Acute systolic CHF (congestive heart failure) ([name])   DCM (dilated cardiomyopathy) ([name])   Coronary artery calcification seen on CAT scan   Infective endocarditis of tricuspid valve  Septic shock due to MRSA bacteremia: Was admitted on July and was found to have MRSA bacteremia and left AMA.  Presented with septic shock with  hypotension, leukocytosis, encephalopathy.  Blood cultures again showed MRSA.  Currently on vancomycin.  Suspected HD line infection.ID following. Afebrile this morning  Infective endocarditis: TEE done on 10/16 showed  vegetations on multiple valves but mainly in the mitral valve.  Cardiothoracic surgery consulted, recommending conservative management with antibiotics for 6 weeks, repeat TEE in 1 to 2 weeks. He may need to finish the antibiotics in the hospital  Acute ischemic stroke/septic infarct:: Patient noticed to have weakness on the left upper extremity.  MRI of the brain done on 10/17 showed sizable right MCA branch infarct.  Likely septic infarct.  Neurology following.  MRI of the brain showed poor signal on the right M2 branch ,could not rule out stenosis.  Flaccid weakness in the left upper extremity persists.  Neurology signed off, recommend outpatient follow-up.  Normal antithrombotics in the setting of infective endocarditis  Combined systolic/diastolic CHF: Echo showed EF of 25 to 30%, severe decrease left ventricular function, global hypokinesis, grade 1 diastolic dysfunction.  2D echo showed filamentous structure on the ventricular side of the mitral valve, thickened aortic valve leaflets/annulus .  His last echo on 03/2019 showed normal ejection fraction. TEE showed multiple vegetations. He remains euvolemic today.  He needs to follow-up with advanced heart failure team as an outpatient.  DKA: A1c of 8.9.  Initially started on insulin drip, now transition to long-acting and sliding scale.  Monitor blood sugars.  Diabetic coordinator following.  Sugars fluctuating  Acute encephalopathy/left upper extremity weakness: Likely secondary to stroke, sepsis, DKA and hospital-acquired delirium..  Mental status improving.  Routine X 2) w Reflex to ID Panel     Status: None   Collection Time: 07/11/23  8:44 AM   Specimen: BLOOD RIGHT ARM  Result Value Ref Range Status   Specimen Description BLOOD RIGHT ARM  Final   Special Requests   Final    BOTTLES DRAWN AEROBIC ONLY Blood Culture adequate volume   Culture   Final    NO GROWTH 5 DAYS Performed at [facility], [address] [place], [name] [address]    Report Status 07/16/2023 FINAL  Final  Culture, blood (Routine X 2) w Reflex to ID Panel     Status: None (Preliminary result)   Collection Time:  07/15/23  3:00 PM   Specimen: BLOOD RIGHT ARM  Result Value Ref Range Status   Specimen Description BLOOD RIGHT ARM  Final   Special Requests   Final    BOTTLES DRAWN AEROBIC AND ANAEROBIC Blood Culture adequate volume   Culture   Final    NO GROWTH 2 DAYS Performed at [facility], [address], [place], [name] [address]    Report Status PENDING  Incomplete  Culture, blood (Routine X 2) w Reflex to ID Panel     Status: None (Preliminary result)   Collection Time: 07/15/23  3:02 PM   Specimen: BLOOD RIGHT ARM  Result Value Ref Range Status   Specimen Description BLOOD RIGHT ARM  Final   Special Requests   Final    BOTTLES DRAWN AEROBIC AND ANAEROBIC Blood Culture adequate volume   Culture   Final    NO GROWTH 2 DAYS Performed at [facility], [address], [place], [name] [address]    Report Status PENDING  Incomplete     Radiology Studies: VAS US CAROTID  Result Date: 07/16/2023 Carotid Arterial Duplex Study Patient Name:  [name]  Date of Exam:   07/16/2023 Medical Rec #: [id number]      Accession #:    [id number] Date of Birth: Jan 21, 1981     Patient Gender: M Patient Age:   42 years Exam Location:  [facility] Procedure:      VAS US CAROTID Referring Phys: Samuel Mcpeek --------------------------------------------------------------------------------  Indications:   CVA. Risk Factors:  Hypertension, Diabetes, no history of smoking, prior CVA. 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 Limitations    Today's exam was limited due to the patient's inability or                unwillingness to cooperate, the patient's respiratory variation                and Cath in IJ, unable to visualize right subclavian. Performing Technologist: Dondra Prader RVT, RCS  Examination Guidelines: A complete evaluation includes B-mode imaging, spectral Doppler, color Doppler, and power Doppler as needed of all accessible portions of each vessel. Bilateral testing is considered an  integral part of a complete examination. Limited examinations for reoccurring indications may be performed as noted.  Right Carotid Findings: +----------+--------+--------+--------+------------------+--------+           PSV cm/sEDV cm/sStenosisPlaque DescriptionComments +----------+--------+--------+--------+------------------+--------+ CCA Prox  86      18                                         +----------+--------+--------+--------+------------------+--------+ CCA Mid   58      10                                         +----------+--------+--------+--------+------------------+--------+

## 2023-07-17 NOTE — Evaluation (Signed)
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  PMH positive for ESRD on HD, IDDM, gastroparesis, L AKA, HTN.   Clinical Impression   Pt admitted for concerns listed above. PTA pt reported that he was fairly independent with ADL's and functional mobility using a RW and manual WC. At this time, pt presenting with increased weakness, L sided hemiplegia, balance deficits, and cognitive concerns. He is having difficulty attending to tasks and following commands. OT recommending continued skilled OT (<3hours/day), as well as acute OT continuing to follow and address impairments listed below.         If plan is discharge home, recommend the following: Two people to help with walking and/or transfers;A lot of help with bathing/dressing/bathroom;Assistance with cooking/housework;Direct supervision/assist for medications management;Assist for transportation;Help with stairs or ramp for entrance    Functional Status Assessment  Patient has had a recent decline in their functional status and demonstrates the ability to make significant improvements in function in a reasonable and predictable amount of time.  Equipment Recommendations  Other (comment) (TBD)    Recommendations for Other Services       Precautions / Restrictions Precautions Precautions: Fall Precaution Comments: L groin HD access, LAKA, L hemiparesis Restrictions Weight Bearing Restrictions: No LLE Weight Bearing: Non weight bearing      Mobility Bed Mobility Overal bed mobility: Needs Assistance Bed Mobility: Rolling Rolling: Mod assist         General bed mobility comments: Pt refusing to sit EOB despite max  cuing and assist from OT. Mod A to roll to both sides    Transfers                   General transfer comment: Pt refused to attempt      Balance                                           ADL either performed or assessed with clinical judgement   ADL Overall ADL's : Needs assistance/impaired Eating/Feeding: Set up;Bed level   Grooming: Minimal assistance;Bed level   Upper Body Bathing: Moderate assistance;Bed level   Lower Body Bathing: Maximal assistance;Bed level   Upper Body Dressing : Maximal assistance;Bed level   Lower Body Dressing: Maximal assistance;Bed level       Toileting- Clothing Manipulation and Hygiene: Maximal assistance;Bed level         General ADL Comments: Pt having difficulty attending to tasks, following commands, and participating this session, requiring mod to max assist with all ADL's.     Vision Baseline Vision/History: 0 No visual deficits Ability to See in Adequate Light: 0 Adequate Patient Visual Report: No change from baseline Vision Assessment?: No apparent visual deficits     Perception         Praxis         Pertinent Vitals/Pain Pain Assessment Pain Assessment: 0-10 Pain Score: 10-Worst pain ever Pain Location: headache and generalized Pain Descriptors / Indicators: Headache Pain Intervention(s): Limited activity within patient's tolerance, Monitored during session, Repositioned, Patient requesting pain meds-RN notified     Extremity/Trunk  Assessment Upper Extremity Assessment Upper Extremity Assessment: LUE deficits/detail LUE Deficits / Details: appears flaccid, pt not moving it, using RUE to "throw it around" LUE Sensation: decreased light touch LUE Coordination: decreased fine motor;decreased gross motor   Lower Extremity Assessment Lower Extremity Assessment: LLE deficits/detail LLE Deficits / Details: AKA   Cervical / Trunk Assessment Cervical / Trunk Assessment: Normal    Communication Communication Communication: No apparent difficulties Cueing Techniques: Verbal cues;Tactile cues   Cognition Arousal: Lethargic Behavior During Therapy: Flat affect Overall Cognitive Status: Impaired/Different from baseline Area of Impairment: Following commands, Problem solving, Safety/judgement, Attention                   Current Attention Level: Sustained   Following Commands: Follows one step commands with increased time, Follows one step commands inconsistently Safety/Judgement: Decreased awareness of deficits, Decreased awareness of safety   Problem Solving: Slow processing, Decreased initiation, Difficulty sequencing, Requires verbal cues, Requires tactile cues General Comments: Very slow to respond, not following commands without repetition and encouragement     General Comments  mom present throughout session    Exercises     Shoulder Instructions      Home Living Family/patient expects to be discharged to:: Private residence Living Arrangements: Parent Available Help at Discharge: Family Type of Home: House Home Access: Ramped entrance     Home Layout: One level     Bathroom Shower/Tub: Chief Strategy Officer: Standard Bathroom Accessibility: Yes How Accessible: Accessible via wheelchair Home Equipment: Wheelchair - Nurse, learning disability (2 wheels)          Prior Functioning/Environment Prior Level of Function : Needs assist;History of Falls (last six months)             Mobility Comments: Mom reports he can transfer on his own with RW ADLs Comments: Overall reports indep        OT Problem List: Decreased strength;Decreased range of motion;Decreased activity tolerance;Impaired balance (sitting and/or standing);Decreased coordination;Decreased cognition;Decreased safety awareness;Decreased knowledge of use of DME or AE;Impaired sensation;Impaired UE functional use;Pain      OT  Treatment/Interventions: Self-care/ADL training;Therapeutic exercise;Energy conservation;DME and/or AE instruction;Neuromuscular education;Therapeutic activities;Patient/family education;Balance training    OT Goals(Current goals can be found in the care plan section) Acute Rehab OT Goals Patient Stated Goal: To go home OT Goal Formulation: With patient/family Time For Goal Achievement: 07/31/23 Potential to Achieve Goals: Good ADL Goals Pt Will Perform Grooming: with set-up;sitting Pt Will Perform Lower Body Bathing: with min assist;sitting/lateral leans Pt Will Perform Lower Body Dressing: with min assist;sitting/lateral leans Pt Will Transfer to Toilet: with mod assist;ambulating Pt Will Perform Toileting - Clothing Manipulation and hygiene: with min assist;sitting/lateral leans  OT Frequency: Min 2X/week    Co-evaluation              AM-PAC OT "6 Clicks" Daily Activity     Outcome Measure Help from another person eating meals?: A Little Help from another person taking care of personal grooming?: A Lot Help from another person toileting, which includes using toliet, bedpan, or urinal?: A Lot Help from another person bathing (including washing, rinsing, drying)?: A Lot Help from another person to put on and taking off regular upper body clothing?: A Lot Help from another person to put on and taking off regular lower body clothing?: A Lot 6 Click Score: 13   End of Session Nurse Communication: Mobility status;Patient requests pain meds  Activity Tolerance: Patient limited by pain Patient left: in bed;with call  bell/phone within reach;with family/visitor present;with bed alarm set  OT Visit Diagnosis: Unsteadiness on feet (R26.81);Other abnormalities of gait and mobility (R26.89);Other symptoms and signs involving the nervous system (R29.898);Hemiplegia and hemiparesis;Muscle weakness (generalized) (M62.81) Hemiplegia - Right/Left: Left Hemiplegia - dominant/non-dominant:  Non-Dominant Hemiplegia - caused by: Cerebral infarction                Time: 1478-2956 OT Time Calculation (min): 23 min Charges:  OT General Charges $OT Visit: 1 Visit OT Evaluation $OT Eval Moderate Complexity: 1 Mod OT Treatments $Self Care/Home Management : 8-22 mins  Trish Mage, OTR/L [facility]  [place] [address] Elane Bing Plume 07/17/2023, 4:38 PM

## 2023-07-17 NOTE — Progress Notes (Signed)
[name] NEPHROLOGY PROGRESS NOTE  Assessment/ Plan: Pt is a 42 y.o. yo male   OP HD: MWF [facility] 4h  400/1.5    77kg   2/3 bath  [facility] TDC    Heparin none - last OP HD 10/7, post wt 74.6kg - coming off 75- 76kg - misses HD once every other week approx - rocaltrol 1.50 mcg  - no esa  # AMS: MRI with acute stroke, neurology is following.  Mental status improving.  # Sepsis - hx of MRSA bacteremia in 03/2023. On vanc. BCx's + for MRSA 10/12, 10/13 NGTD at 5 days. TEE 10/16 with vegetations - CT surgery requests repeat TEE 2 wks. he had a catheter holiday and then femoral TDC was placed by IR on 10/18.  # ESRD - on HD MWF.  Status post dialysis yesterday with 1.6 L UF, tolerated well.  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  # HTN/ BP - BP's are soft in the 90s- 110 range but tolerating HD.  # Volume - no vol excess, at his dry wt. CXR clear.  UF with HD.   # Anemia esrd - Hb 9s. No esa at OP unit - start low dose here. 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   #MBD ckd - CCa in range, phos better with binders. On VDRA, sensipar, renvela.   Subjective:  Seen and examined at bedside.  Fluctuation in mental status.  Asking Dilaudid IV.  Denies nausea, vomiting, chest pain or shortness of breath.  Could not specify where he has the pain.  Objective Vital signs in last 24 hours: Vitals:   07/16/23 2218 07/16/23 2256 07/17/23 0612 07/17/23 0727  BP: (!) 99/58 101/67 92/67   Pulse:  100 (!) 107 (!) 103  Resp: 15 17 18 17   Temp: 98.4 F (36.9 C) 98.1 F (36.7 C) 99.6 F (37.6 C) 97.8 F (36.6 C)  TempSrc: Oral Oral Oral Oral  SpO2: 99% 98% 94% 99%  Weight:       Weight change:   Intake/Output Summary (Last 24 hours) at 07/17/2023 1135 Last data filed at 07/16/2023 2218 Gross per 24 hour  Intake 100 ml  Output 1600 ml  Net  -1500 ml       Labs: RENAL PANEL Recent Labs  Lab 07/12/23 0520 07/13/23 0507 07/14/23 0910 07/15/23 0609 07/16/23 1835  NA 131* 132* 127* 131* 131*  K 3.3* 3.6 4.3 4.1 3.5  CL 96* 95* 92* 89* 91*  CO2 22 21* 16* 14* 21*  GLUCOSE 100* 89 191* 332* 223*  BUN 39* 51* 62* 49* 61*  CREATININE 8.83* 10.30* 11.70* 8.54* 10.04*  CALCIUM 8.6* 9.1 8.1* 8.3* 7.9*  PHOS  --   --   --  5.2* 3.7  ALBUMIN  --   --   --   --  2.0*    Liver Function Tests: Recent Labs  Lab 07/16/23 1835  ALBUMIN 2.0*   No results for input(s): "LIPASE", "AMYLASE" in the last 168 hours. No results for input(s): "AMMONIA" in the last 168 hours.  CBC: Recent Labs    07/11/23 0547 07/12/23 0520 07/14/23 0910 07/15/23 0609 07/16/23 1835  HGB 10.2* 10.0* 9.2* 9.3* 8.9*  MCV 84.9 82.4 87.1 88.8 85.3    Cardiac Enzymes: No results for input(s): "CKTOTAL", "CKMB", "CKMBINDEX", "TROPONINI" in the last 168 hours. CBG:  Recent Labs  Lab 07/16/23 0511 07/16/23 1002 07/16/23 1346 07/16/23 1730 07/17/23 0725  GLUCAP 267* 267* 284* 220* 188*    Iron Studies: No results for input(s): "IRON", "TIBC", "TRANSFERRIN", "FERRITIN" in the last 72 hours. Studies/Results: VAS US CAROTID  Result Date: 07/16/2023 Carotid Arterial Duplex Study Patient Name:  [name]  Date of Exam:   07/16/2023 Medical Rec #: [id number]      Accession #:    [id number] Date of Birth: 1980/11/19     Patient Gender: M Patient Age:   13 years Exam Location:  [facility] Procedure:      VAS US CAROTID Referring Phys: AMRIT ADHIKARI --------------------------------------------------------------------------------  Indications:   CVA. Risk Factors:  Hypertension, Diabetes, no history of smoking, prior CVA. 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 Limitations    Today's exam was limited due to the patient's inability or                unwillingness to cooperate, the patient's respiratory variation                and Cath in IJ,  unable to visualize right subclavian. Performing Technologist: Dondra Prader RVT, RCS  Examination Guidelines: A complete evaluation includes B-mode imaging, spectral Doppler, color Doppler, and power Doppler as needed of all accessible portions of each vessel. Bilateral testing is considered an integral part of a complete examination. Limited examinations for reoccurring indications may be performed as noted.  Right Carotid Findings: +----------+--------+--------+--------+------------------+--------+           PSV cm/sEDV cm/sStenosisPlaque DescriptionComments +----------+--------+--------+--------+------------------+--------+ CCA Prox  86      18                                         +----------+--------+--------+--------+------------------+--------+ CCA Mid   58      10                                         +----------+--------+--------+--------+------------------+--------+ CCA Distal82      15                                         +----------+--------+--------+--------+------------------+--------+ ICA Prox  69      25      Normal                             +----------+--------+--------+--------+------------------+--------+ ICA Mid   93      33                                         +----------+--------+--------+--------+------------------+--------+ ICA Distal92      28                                         +----------+--------+--------+--------+------------------+--------+ ECA       40                                                 +----------+--------+--------+--------+------------------+--------+ +---------+--------+--+--------+--+---------+  VertebralPSV cm/s45EDV cm/s13Antegrade +---------+--------+--+--------+--+---------+  Left Carotid Findings: +----------+--------+--------+--------+------------------+--------+           PSV cm/sEDV cm/sStenosisPlaque DescriptionComments  +----------+--------+--------+--------+------------------+--------+ CCA Prox  120     20                                         +----------+--------+--------+--------+------------------+--------+ CCA Mid   109     21                                         +----------+--------+--------+--------+------------------+--------+ CCA Distal86      11                                         +----------+--------+--------+--------+------------------+--------+ ICA Prox  67      23      Normal                             +----------+--------+--------+--------+------------------+--------+ ICA Mid   100     32                                         +----------+--------+--------+--------+------------------+--------+ ICA Distal89      29                                         +----------+--------+--------+--------+------------------+--------+ ECA       45                                                 +----------+--------+--------+--------+------------------+--------+ +----------+--------+--------+--------+-------------------+           PSV cm/sEDV cm/sDescribeArm Pressure (mmHG) +----------+--------+--------+--------+-------------------+ Subclavian115                                         +----------+--------+--------+--------+-------------------+ +---------+--------+--+--------+--+---------+ VertebralPSV cm/s53EDV cm/s15Antegrade +---------+--------+--+--------+--+---------+   Summary: Right Carotid: There was no evidence of thrombus, dissection, atherosclerotic                plaque or stenosis in the cervical carotid system. Left Carotid: There was no evidence of thrombus, dissection, atherosclerotic               plaque or stenosis in the cervical carotid system. Vertebrals:  Bilateral vertebral arteries demonstrate antegrade flow. Subclavians: Normal flow hemodynamics were seen in the left subclavian artery. *See table(s) above for measurements and  observations.  Electronically signed by Sherald Hess MD on 07/16/2023 at 4:24:45 PM.    Final    IR Fluoro Guide CV Line Left  Result Date: 07/16/2023 INDICATION: ESRD on HD.  Challenging vascular access. EXAM: Title: TUNNELED CENTRAL VENOUS HEMODIALYSIS CATHETER PLACEMENT WITH ULTRASOUND AND FLUOROSCOPIC GUIDANCE Procedures: 1. ULTRASOUND-GUIDED RIGHT JUGULAR, LEFT JUGULAR AND LEFT FEMORAL VENOUS ACCESS 2. RIGHT  JUGULAR VENOGRAPHY 3. LEFT JUGULAR VENOGRAPHY 4. LEFT FEMORAL TUNNELED HEMODIALYSIS CATHETER PLACEMENT MEDICATIONS: Ancef 2 gm IV . The antibiotic was given in an appropriate time interval prior to skin puncture. ANESTHESIA/SEDATION: Moderate (conscious) sedation was employed during this procedure. A total of Versed 1 mg and Fentanyl 50 mcg was administered intravenously. Moderate Sedation Time: 54 minutes. The patient's level of consciousness and vital signs were monitored continuously by radiology nursing throughout the procedure under my direct supervision. FLUOROSCOPY TIME:  Fluoroscopic dose; 61 mGy COMPLICATIONS: None immediate. PROCEDURE: Informed written consent was obtained from the patient and/or patient's representative after a discussion of the risks, benefits, and alternatives to treatment. Questions regarding the procedure were encouraged and answered. The operative sites were prepped with chlorhexidine in a sterile fashion, and a sterile drape was applied covering the operative field. Maximum barrier sterile technique with sterile gowns and gloves were used for the procedure. A timeout was performed prior to the initiation of the procedure. Initial access was obtained at the RIGHT neck, however the microwire would not advance. RIGHT jugular venography was then performed. The RIGHT neck access site was aborted. Access was then obtained at the LEFT neck, however the microwire would not advance into the SVC. LEFT jugular venography was also performed. The LEFT neck access site was also  aborted. Sonographic evaluation of the LEFT groin demonstrated a widely patent greater saphenous and femoral veins. After creating a small venotomy incision, a micropuncture kit was utilized to access the LEFT greater saphenous vein. Real-time ultrasound guidance was utilized for vascular access including the acquisition of a permanent ultrasound image documenting patency of the accessed vessel. The microwire was utilized to measure appropriate catheter length. A stiff Glidewire was advanced to the level of the IVC and the micropuncture sheath was exchanged for a peel-away sheath. A palindrome tunneled hemodialysis catheter measuring 33 cm from tip to cuff was tunneled in a retrograde fashion from the lateral LEFT thigh to the venotomy incision. The catheter was then placed through the peel-away sheath with tips ultimately positioned within the infrarenal IVC. Final catheter positioning was confirmed and documented with a spot radiographic image. The catheter aspirates and flushes normally. The catheter was flushed with appropriate volume heparin dwells. The catheter exit site was secured with a 2-0 Ethilon retention suture. The venotomy incision was closed with Dermabond. Dressings were applied. The patient tolerated the procedure well without immediate post procedural complication. IMPRESSION: 1. Occluded superior vena cava, as demonstrated by RIGHT and LEFT jugular venography. 2. Successful placement of a 33 cm tip to cuff tunneled hemodialysis catheter via the LEFT femoral vein. The tip of the catheter is positioned infrarenal IVC. The catheter is ready for immediate use. RECOMMENDATIONS: Challenging vascular access patient. DO NOT REMOVE THE PATIENT'S HEMODIALYSIS CATHETER without consulting vascular and Interventional Radiology (VIR). Roanna Banning, MD [facility] Special[facility] Radiology Electronically Signed   By: Roanna Banning M.D.   On: 07/16/2023 13:43   IR US Guide Vasc  Access Left  Result Date: 07/16/2023 INDICATION: ESRD on HD.  Challenging vascular access. EXAM: Title: TUNNELED CENTRAL VENOUS HEMODIALYSIS CATHETER PLACEMENT WITH ULTRASOUND AND FLUOROSCOPIC GUIDANCE Procedures: 1. ULTRASOUND-GUIDED RIGHT JUGULAR, LEFT JUGULAR AND LEFT FEMORAL VENOUS ACCESS 2. RIGHT JUGULAR VENOGRAPHY 3. LEFT JUGULAR VENOGRAPHY 4. LEFT FEMORAL TUNNELED HEMODIALYSIS CATHETER PLACEMENT MEDICATIONS: Ancef 2 gm IV . The antibiotic was given in an appropriate time interval prior to skin puncture. ANESTHESIA/SEDATION: Moderate (conscious) sedation was employed during this procedure. A total of Versed 1  mg and Fentanyl 50 mcg was administered intravenously. Moderate Sedation Time: 54 minutes. The patient's level of consciousness and vital signs were monitored continuously by radiology nursing throughout the procedure under my direct supervision. FLUOROSCOPY TIME:  Fluoroscopic dose; 61 mGy COMPLICATIONS: None immediate. PROCEDURE: Informed written consent was obtained from the patient and/or patient's representative after a discussion of the risks, benefits, and alternatives to treatment. Questions regarding the procedure were encouraged and answered. The operative sites were prepped with chlorhexidine in a sterile fashion, and a sterile drape was applied covering the operative field. Maximum barrier sterile technique with sterile gowns and gloves were used for the procedure. A timeout was performed prior to the initiation of the procedure. Initial access was obtained at the RIGHT neck, however the microwire would not advance. RIGHT jugular venography was then performed. The RIGHT neck access site was aborted. Access was then obtained at the LEFT neck, however the microwire would not advance into the SVC. LEFT jugular venography was also performed. The LEFT neck access site was also aborted. Sonographic evaluation of the LEFT groin demonstrated a widely patent greater saphenous and femoral veins.  After creating a small venotomy incision, a micropuncture kit was utilized to access the LEFT greater saphenous vein. Real-time ultrasound guidance was utilized for vascular access including the acquisition of a permanent ultrasound image documenting patency of the accessed vessel. The microwire was utilized to measure appropriate catheter length. A stiff Glidewire was advanced to the level of the IVC and the micropuncture sheath was exchanged for a peel-away sheath. A palindrome tunneled hemodialysis catheter measuring 33 cm from tip to cuff was tunneled in a retrograde fashion from the lateral LEFT thigh to the venotomy incision. The catheter was then placed through the peel-away sheath with tips ultimately positioned within the infrarenal IVC. Final catheter positioning was confirmed and documented with a spot radiographic image. The catheter aspirates and flushes normally. The catheter was flushed with appropriate volume heparin dwells. The catheter exit site was secured with a 2-0 Ethilon retention suture. The venotomy incision was closed with Dermabond. Dressings were applied. The patient tolerated the procedure well without immediate post procedural complication. IMPRESSION: 1. Occluded superior vena cava, as demonstrated by RIGHT and LEFT jugular venography. 2. Successful placement of a 33 cm tip to cuff tunneled hemodialysis catheter via the LEFT femoral vein. The tip of the catheter is positioned infrarenal IVC. The catheter is ready for immediate use. RECOMMENDATIONS: Challenging vascular access patient. DO NOT REMOVE THE PATIENT'S HEMODIALYSIS CATHETER without consulting vascular and Interventional Radiology (VIR). Roanna Banning, MD Vascular and Interventional Radiology Specialists [facility] Radiology Electronically Signed   By: Roanna Banning M.D.   On: 07/16/2023 13:43   IR Veno/Jugular Right  Result Date: 07/16/2023 INDICATION: ESRD on HD.  Challenging vascular access. EXAM: Title: TUNNELED CENTRAL  VENOUS HEMODIALYSIS CATHETER PLACEMENT WITH ULTRASOUND AND FLUOROSCOPIC GUIDANCE Procedures: 1. ULTRASOUND-GUIDED RIGHT JUGULAR, LEFT JUGULAR AND LEFT FEMORAL VENOUS ACCESS 2. RIGHT JUGULAR VENOGRAPHY 3. LEFT JUGULAR VENOGRAPHY 4. LEFT FEMORAL TUNNELED HEMODIALYSIS CATHETER PLACEMENT MEDICATIONS: Ancef 2 gm IV . The antibiotic was given in an appropriate time interval prior to skin puncture. ANESTHESIA/SEDATION: Moderate (conscious) sedation was employed during this procedure. A total of Versed 1 mg and Fentanyl 50 mcg was administered intravenously. Moderate Sedation Time: 54 minutes. The patient's level of consciousness and vital signs were monitored continuously by radiology nursing throughout the procedure under my direct supervision. FLUOROSCOPY TIME:  Fluoroscopic dose; 61 mGy COMPLICATIONS: None immediate. PROCEDURE: Informed written consent was  obtained from the patient and/or patient's representative after a discussion of the risks, benefits, and alternatives to treatment. Questions regarding the procedure were encouraged and answered. The operative sites were prepped with chlorhexidine in a sterile fashion, and a sterile drape was applied covering the operative field. Maximum barrier sterile technique with sterile gowns and gloves were used for the procedure. A timeout was performed prior to the initiation of the procedure. Initial access was obtained at the RIGHT neck, however the microwire would not advance. RIGHT jugular venography was then performed. The RIGHT neck access site was aborted. Access was then obtained at the LEFT neck, however the microwire would not advance into the SVC. LEFT jugular venography was also performed. The LEFT neck access site was also aborted. Sonographic evaluation of the LEFT groin demonstrated a widely patent greater saphenous and femoral veins. After creating a small venotomy incision, a micropuncture kit was utilized to access the LEFT greater saphenous vein. Real-time  ultrasound guidance was utilized for vascular access including the acquisition of a permanent ultrasound image documenting patency of the accessed vessel. The microwire was utilized to measure appropriate catheter length. A stiff Glidewire was advanced to the level of the IVC and the micropuncture sheath was exchanged for a peel-away sheath. A palindrome tunneled hemodialysis catheter measuring 33 cm from tip to cuff was tunneled in a retrograde fashion from the lateral LEFT thigh to the venotomy incision. The catheter was then placed through the peel-away sheath with tips ultimately positioned within the infrarenal IVC. Final catheter positioning was confirmed and documented with a spot radiographic image. The catheter aspirates and flushes normally. The catheter was flushed with appropriate volume heparin dwells. The catheter exit site was secured with a 2-0 Ethilon retention suture. The venotomy incision was closed with Dermabond. Dressings were applied. The patient tolerated the procedure well without immediate post procedural complication. IMPRESSION: 1. Occluded superior vena cava, as demonstrated by RIGHT and LEFT jugular venography. 2. Successful placement of a 33 cm tip to cuff tunneled hemodialysis catheter via the LEFT femoral vein. The tip of the catheter is positioned infrarenal IVC. The catheter is ready for immediate use. RECOMMENDATIONS: Challenging vascular access patient. DO NOT REMOVE THE PATIENT'S HEMODIALYSIS CATHETER without consulting vascular and Interventional Radiology (VIR). Roanna Banning, MD Vascular and Interventional Radiology Specialists [name] Radiology Electronically Signed   By: Roanna Banning M.D.   On: 07/16/2023 13:43   IR Veno/Jugular Left  Result Date: 07/16/2023 INDICATION: ESRD on HD.  Challenging vascular access. EXAM: Title: TUNNELED CENTRAL VENOUS HEMODIALYSIS CATHETER PLACEMENT WITH ULTRASOUND AND FLUOROSCOPIC GUIDANCE Procedures: 1. ULTRASOUND-GUIDED RIGHT JUGULAR,  LEFT JUGULAR AND LEFT FEMORAL VENOUS ACCESS 2. RIGHT JUGULAR VENOGRAPHY 3. LEFT JUGULAR VENOGRAPHY 4. LEFT FEMORAL TUNNELED HEMODIALYSIS CATHETER PLACEMENT MEDICATIONS: Ancef 2 gm IV . The antibiotic was given in an appropriate time interval prior to skin puncture. ANESTHESIA/SEDATION: Moderate (conscious) sedation was employed during this procedure. A total of Versed 1 mg and Fentanyl 50 mcg was administered intravenously. Moderate Sedation Time: 54 minutes. The patient's level of consciousness and vital signs were monitored continuously by radiology nursing throughout the procedure under my direct supervision. FLUOROSCOPY TIME:  Fluoroscopic dose; 61 mGy COMPLICATIONS: None immediate. PROCEDURE: Informed written consent was obtained from the patient and/or patient's representative after a discussion of the risks, benefits, and alternatives to treatment. Questions regarding the procedure were encouraged and answered. The operative sites were prepped with chlorhexidine in a sterile fashion, and a sterile drape was applied covering the operative field. Maximum barrier  sterile technique with sterile gowns and gloves were used for the procedure. A timeout was performed prior to the initiation of the procedure. Initial access was obtained at the RIGHT neck, however the microwire would not advance. RIGHT jugular venography was then performed. The RIGHT neck access site was aborted. Access was then obtained at the LEFT neck, however the microwire would not advance into the SVC. LEFT jugular venography was also performed. The LEFT neck access site was also aborted. Sonographic evaluation of the LEFT groin demonstrated a widely patent greater saphenous and femoral veins. After creating a small venotomy incision, a micropuncture kit was utilized to access the LEFT greater saphenous vein. Real-time ultrasound guidance was utilized for vascular access including the acquisition of a permanent ultrasound image documenting  patency of the accessed vessel. The microwire was utilized to measure appropriate catheter length. A stiff Glidewire was advanced to the level of the IVC and the micropuncture sheath was exchanged for a peel-away sheath. A palindrome tunneled hemodialysis catheter measuring 33 cm from tip to cuff was tunneled in a retrograde fashion from the lateral LEFT thigh to the venotomy incision. The catheter was then placed through the peel-away sheath with tips ultimately positioned within the infrarenal IVC. Final catheter positioning was confirmed and documented with a spot radiographic image. The catheter aspirates and flushes normally. The catheter was flushed with appropriate volume heparin dwells. The catheter exit site was secured with a 2-0 Ethilon retention suture. The venotomy incision was closed with Dermabond. Dressings were applied. The patient tolerated the procedure well without immediate post procedural complication. IMPRESSION: 1. Occluded superior vena cava, as demonstrated by RIGHT and LEFT jugular venography. 2. Successful placement of a 33 cm tip to cuff tunneled hemodialysis catheter via the LEFT femoral vein. The tip of the catheter is positioned infrarenal IVC. The catheter is ready for immediate use. RECOMMENDATIONS: Challenging vascular access patient. DO NOT REMOVE THE PATIENT'S HEMODIALYSIS CATHETER without consulting vascular and Interventional Radiology (VIR). Roanna Banning, MD Vascular and Interventional Radiology Specialists [facility] Radiology Electronically Signed   By: Roanna Banning M.D.   On: 07/16/2023 13:43   IR US Guide Vasc Access Right  Result Date: 07/16/2023 INDICATION: ESRD on HD.  Challenging vascular access. EXAM: Title: TUNNELED CENTRAL VENOUS HEMODIALYSIS CATHETER PLACEMENT WITH ULTRASOUND AND FLUOROSCOPIC GUIDANCE Procedures: 1. ULTRASOUND-GUIDED RIGHT JUGULAR, LEFT JUGULAR AND LEFT FEMORAL VENOUS ACCESS 2. RIGHT JUGULAR VENOGRAPHY 3. LEFT JUGULAR VENOGRAPHY 4. LEFT FEMORAL  TUNNELED HEMODIALYSIS CATHETER PLACEMENT MEDICATIONS: Ancef 2 gm IV . The antibiotic was given in an appropriate time interval prior to skin puncture. ANESTHESIA/SEDATION: Moderate (conscious) sedation was employed during this procedure. A total of Versed 1 mg and Fentanyl 50 mcg was administered intravenously. Moderate Sedation Time: 54 minutes. The patient's level of consciousness and vital signs were monitored continuously by radiology nursing throughout the procedure under my direct supervision. FLUOROSCOPY TIME:  Fluoroscopic dose; 61 mGy COMPLICATIONS: None immediate. PROCEDURE: Informed written consent was obtained from the patient and/or patient's representative after a discussion of the risks, benefits, and alternatives to treatment. Questions regarding the procedure were encouraged and answered. The operative sites were prepped with chlorhexidine in a sterile fashion, and a sterile drape was applied covering the operative field. Maximum barrier sterile technique with sterile gowns and gloves were used for the procedure. A timeout was performed prior to the initiation of the procedure. Initial access was obtained at the RIGHT neck, however the microwire would not advance. RIGHT jugular venography was then performed. The RIGHT neck  access site was aborted. Access was then obtained at the LEFT neck, however the microwire would not advance into the SVC. LEFT jugular venography was also performed. The LEFT neck access site was also aborted. Sonographic evaluation of the LEFT groin demonstrated a widely patent greater saphenous and femoral veins. After creating a small venotomy incision, a micropuncture kit was utilized to access the LEFT greater saphenous vein. Real-time ultrasound guidance was utilized for vascular access including the acquisition of a permanent ultrasound image documenting patency of the accessed vessel. The microwire was utilized to measure appropriate catheter length. A stiff Glidewire was  advanced to the level of the IVC and the micropuncture sheath was exchanged for a peel-away sheath. A palindrome tunneled hemodialysis catheter measuring 33 cm from tip to cuff was tunneled in a retrograde fashion from the lateral LEFT thigh to the venotomy incision. The catheter was then placed through the peel-away sheath with tips ultimately positioned within the infrarenal IVC. Final catheter positioning was confirmed and documented with a spot radiographic image. The catheter aspirates and flushes normally. The catheter was flushed with appropriate volume heparin dwells. The catheter exit site was secured with a 2-0 Ethilon retention suture. The venotomy incision was closed with Dermabond. Dressings were applied. The patient tolerated the procedure well without immediate post procedural complication. IMPRESSION: 1. Occluded superior vena cava, as demonstrated by RIGHT and LEFT jugular venography. 2. Successful placement of a 33 cm tip to cuff tunneled hemodialysis catheter via the LEFT femoral vein. The tip of the catheter is positioned infrarenal IVC. The catheter is ready for immediate use. RECOMMENDATIONS: Challenging vascular access patient. DO NOT REMOVE THE PATIENT'S HEMODIALYSIS CATHETER without consulting vascular and Interventional Radiology (VIR). Roanna Banning, MD [facility] Radiology Electronically Signed   By: Roanna Banning M.D.   On: 07/16/2023 13:43   IR US Guide Vasc Access Left  Result Date: 07/16/2023 INDICATION: ESRD on HD.  Challenging vascular access. EXAM: Title: TUNNELED CENTRAL VENOUS HEMODIALYSIS CATHETER PLACEMENT WITH ULTRASOUND AND FLUOROSCOPIC GUIDANCE Procedures: 1. ULTRASOUND-GUIDED RIGHT JUGULAR, LEFT JUGULAR AND LEFT FEMORAL VENOUS ACCESS 2. RIGHT JUGULAR VENOGRAPHY 3. LEFT JUGULAR VENOGRAPHY 4. LEFT FEMORAL TUNNELED HEMODIALYSIS CATHETER PLACEMENT MEDICATIONS: Ancef 2 gm IV . The antibiotic was given in an appropriate time  interval prior to skin puncture. ANESTHESIA/SEDATION: Moderate (conscious) sedation was employed during this procedure. A total of Versed 1 mg and Fentanyl 50 mcg was administered intravenously. Moderate Sedation Time: 54 minutes. The patient's level of consciousness and vital signs were monitored continuously by radiology nursing throughout the procedure under my direct supervision. FLUOROSCOPY TIME:  Fluoroscopic dose; 61 mGy COMPLICATIONS: None immediate. PROCEDURE: Informed written consent was obtained from the patient and/or patient's representative after a discussion of the risks, benefits, and alternatives to treatment. Questions regarding the procedure were encouraged and answered. The operative sites were prepped with chlorhexidine in a sterile fashion, and a sterile drape was applied covering the operative field. Maximum barrier sterile technique with sterile gowns and gloves were used for the procedure. A timeout was performed prior to the initiation of the procedure. Initial access was obtained at the RIGHT neck, however the microwire would not advance. RIGHT jugular venography was then performed. The RIGHT neck access site was aborted. Access was then obtained at the LEFT neck, however the microwire would not advance into the SVC. LEFT jugular venography was also performed. The LEFT neck access site was also aborted. Sonographic evaluation of the LEFT groin demonstrated a widely patent greater  saphenous and femoral veins. After creating a small venotomy incision, a micropuncture kit was utilized to access the LEFT greater saphenous vein. Real-time ultrasound guidance was utilized for vascular access including the acquisition of a permanent ultrasound image documenting patency of the accessed vessel. The microwire was utilized to measure appropriate catheter length. A stiff Glidewire was advanced to the level of the IVC and the micropuncture sheath was exchanged for a peel-away sheath. A palindrome  tunneled hemodialysis catheter measuring 33 cm from tip to cuff was tunneled in a retrograde fashion from the lateral LEFT thigh to the venotomy incision. The catheter was then placed through the peel-away sheath with tips ultimately positioned within the infrarenal IVC. Final catheter positioning was confirmed and documented with a spot radiographic image. The catheter aspirates and flushes normally. The catheter was flushed with appropriate volume heparin dwells. The catheter exit site was secured with a 2-0 Ethilon retention suture. The venotomy incision was closed with Dermabond. Dressings were applied. The patient tolerated the procedure well without immediate post procedural complication. IMPRESSION: 1. Occluded superior vena cava, as demonstrated by RIGHT and LEFT jugular venography. 2. Successful placement of a 33 cm tip to cuff tunneled hemodialysis catheter via the LEFT femoral vein. The tip of the catheter is positioned infrarenal IVC. The catheter is ready for immediate use. RECOMMENDATIONS: Challenging vascular access patient. DO NOT REMOVE THE PATIENT'S HEMODIALYSIS CATHETER without consulting vascular and Interventional Radiology (VIR). Roanna Banning, MD [facility] Radiology Electronically Signed   By: Roanna Banning M.D.   On: 07/16/2023 13:43   MR ANGIO HEAD WO CONTRAST  Result Date: 07/15/2023 CLINICAL DATA:  Septic emboli EXAM: MRA HEAD WITHOUT CONTRAST TECHNIQUE: Angiographic images of the Circle of Willis were acquired using MRA technique without intravenous contrast. COMPARISON:  No prior MRA available, correlation is made with MRI head 07/15/2023 FINDINGS: Anterior circulation: Both internal carotid arteries are patent to the termini, without significant stenosis. A1 segments patent. Normal anterior communicating artery. Anterior cerebral arteries are patent to their distal aspects without significant stenosis. No M1 stenosis or occlusion.  Poor signal in a long segment of a right M2 branch (series 14, images 103-142). MCA branches are otherwise perfused to their distal aspects without significant stenosis, although there is overall decreased flow signal in the right frontal lobe in the area of infarction noted on the same-day MRI. Posterior circulation: Vertebral arteries patent to the vertebrobasilar junction without stenosis. Posterior inferior cerebral arteries patent bilaterally. Basilar patent to its distal aspect. Superior cerebellar arteries patent proximally. Patent P1 segments. PCAs perfused to their distal aspects without significant stenosis. The bilateral posterior communicating arteries are patent. Anatomic variants: None significant IMPRESSION: 1. Poor signal in a long segment of a right M2 branch, which is favored to be artifactual, but could represent a true stenosis or occlusion. 2. No other significant intracranial stenosis. Electronically Signed   By: Wiliam Ke M.D.   On: 07/15/2023 21:46   MR BRAIN WO CONTRAST  Result Date: 07/15/2023 CLINICAL DATA:  Neuro deficit with acute stroke suspected. Altered mental status. Cervical radiculopathy. EXAM: MRI HEAD WITHOUT CONTRAST MRI CERVICAL SPINE WITHOUT CONTRAST TECHNIQUE: Multiplanar, multiecho pulse sequences of the brain and surrounding structures, and cervical spine, to include the craniocervical junction and cervicothoracic junction, were obtained without intravenous contrast. COMPARISON:  Head and cervical spine CT 08/08/2021. Head CT 07/09/2023 FINDINGS: MRI HEAD FINDINGS Brain: Sizable infarct in the right frontal lobe and insula extending into the parietal lobe, upper division  MCA territory. Small acute infarcts seen in the left occipital lobe and bilateral cerebellum. ADC map correlation is limited due to the to the degree of motion artifact. Petechial hemorrhage by gradient imaging and T1 hyperintensity at the level of the confluent right cerebral infarct. No hematoma,  hydrocephalus, or shift. Vascular: Grossly preserved flow voids Skull and upper cervical spine: Normal marrow signal Sinuses/Orbits: Opacified left maxillary sinus, new from 2022 and also seen on most recent head CT. MRI CERVICAL SPINE FINDINGS Alignment: Normal Vertebrae: Diffusely hypointense marrow on T1 weighted imaging, likely related to patient's history of end-stage renal disease with anemia. No focal lesion is seen. On STIR imaging no detected marrow edema to imply infection or fracture Cord: Normal shape and volume by sagittal T1 weighted imaging; nondiagnostic assessment on the other sequences. Posterior Fossa, vertebral arteries, paraspinal tissues: Prevertebral edema/fluid without visible source. There is muscular and subcutaneous edema which is generalized and could be from volume overload. Disc levels: No visible herniation or impingement These results will be called to the ordering clinician or representative by the Radiologist Assistant, and communication documented in the PACS or [employer]. IMPRESSION: Brain MRI: 1. 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 Small acute infarcts in the left occipital lobe and bilateral cerebellum. Petechial hemorrhage is present without hematoma. No worrisome mass effect. 2. Significant motion artifact. Cervical MRI: 1. Marked motion artifact with most images being nondiagnostic. 2. Prevertebral edema/fluid but no visible spinal source or drainable collection. Electronically Signed   By: Tiburcio Pea M.D.   On: 07/15/2023 12:35   MR CERVICAL SPINE WO CONTRAST  Result Date: 07/15/2023 CLINICAL DATA:  Neuro deficit with acute stroke suspected. Altered mental status. Cervical radiculopathy. EXAM: MRI HEAD WITHOUT CONTRAST MRI CERVICAL SPINE WITHOUT CONTRAST TECHNIQUE: Multiplanar, multiecho pulse sequences of the brain and surrounding structures, and cervical spine, to include the craniocervical junction and cervicothoracic junction,  were obtained without intravenous contrast. COMPARISON:  Head and cervical spine CT 08/08/2021. Head CT 07/09/2023 FINDINGS: MRI HEAD FINDINGS Brain: Sizable infarct in the right frontal lobe and insula extending into the parietal lobe, upper division MCA territory. Small acute infarcts seen in the left occipital lobe and bilateral cerebellum. ADC map correlation is limited due to the to the degree of motion artifact. Petechial hemorrhage by gradient imaging and T1 hyperintensity at the level of the confluent right cerebral infarct. No hematoma, hydrocephalus, or shift. Vascular: Grossly preserved flow voids Skull and upper cervical spine: Normal marrow signal Sinuses/Orbits: Opacified left maxillary sinus, new from 2022 and also seen on most recent head CT. MRI CERVICAL SPINE FINDINGS Alignment: Normal Vertebrae: Diffusely hypointense marrow on T1 weighted imaging, likely related to patient's history of end-stage renal disease with anemia. No focal lesion is seen. On STIR imaging no detected marrow edema to imply infection or fracture Cord: Normal shape and volume by sagittal T1 weighted imaging; nondiagnostic assessment on the other sequences. Posterior Fossa, vertebral arteries, paraspinal tissues: Prevertebral edema/fluid without visible source. There is muscular and subcutaneous edema which is generalized and could be from volume overload. Disc levels: No visible herniation or impingement These results will be called to the ordering clinician or representative by the Radiologist Assistant, and communication documented in the PACS or [employer]. IMPRESSION: Brain MRI: 1. 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 Small acute infarcts in the left occipital lobe and bilateral cerebellum. Petechial hemorrhage is present without hematoma. No worrisome mass effect. 2. Significant motion artifact. Cervical MRI: 1. Marked  motion artifact with most images being nondiagnostic. 2. Prevertebral  edema/fluid but no visible spinal source or drainable collection. Electronically Signed   By: Tiburcio Pea M.D.   On: 07/15/2023 12:35    Medications: Infusions:  vancomycin Stopped (07/14/23 1842)    Scheduled Medications:  calcitRIOL  1.5 mcg Oral Q M,W,F-HD   Chlorhexidine Gluconate Cloth  6 each Topical Q0600   cinacalcet  120 mg Oral Q supper   darbepoetin (ARANESP) injection - DIALYSIS  40 mcg Subcutaneous Q Fri-1800   famotidine  10 mg Oral Daily   feeding supplement (GLUCERNA SHAKE)  237 mL Oral TID BM   gabapentin  100 mg Oral TID   heparin sodium (porcine)  4,600 Units Intracatheter Once   insulin aspart  0-6 Units Subcutaneous TID WC   insulin glargine-yfgn  10 Units Subcutaneous Daily   multivitamin with minerals  1 tablet Oral QHS   sevelamer carbonate  2,400 mg Oral TID WC    have reviewed scheduled and prn medications.  Physical Exam: General:NAD, comfortable Heart:RRR, s1s2 nl Lungs:clear b/l, no crackle Abdomen:soft, Non-tender, non-distended Extremities:No edema Dialysis Access: Femoral TDC site clean.  [name] Prasad Sherlin Sonier 07/17/2023,11:35 AM  LOS: 7 days

## 2023-07-18 ENCOUNTER — Inpatient Hospital Stay (HOSPITAL_COMMUNITY): Payer: Medicare HMO

## 2023-07-18 DIAGNOSIS — Z7189 Other specified counseling: Secondary | ICD-10-CM

## 2023-07-18 DIAGNOSIS — Z515 Encounter for palliative care: Secondary | ICD-10-CM

## 2023-07-18 DIAGNOSIS — N186 End stage renal disease: Secondary | ICD-10-CM | POA: Diagnosis not present

## 2023-07-18 DIAGNOSIS — I059 Rheumatic mitral valve disease, unspecified: Secondary | ICD-10-CM | POA: Diagnosis not present

## 2023-07-18 DIAGNOSIS — T827XXD Infection and inflammatory reaction due to other cardiac and vascular devices, implants and grafts, subsequent encounter: Secondary | ICD-10-CM

## 2023-07-18 LAB — GLUCOSE, CAPILLARY
Glucose-Capillary: 244 mg/dL — ABNORMAL HIGH (ref 70–99)
Glucose-Capillary: 417 mg/dL — ABNORMAL HIGH (ref 70–99)
Glucose-Capillary: 98 mg/dL (ref 70–99)
Glucose-Capillary: 176 mg/dL — ABNORMAL HIGH (ref 70–99)
Glucose-Capillary: 58 mg/dL — ABNORMAL LOW (ref 70–99)
Glucose-Capillary: 59 mg/dL — ABNORMAL LOW (ref 70–99)

## 2023-07-18 MED ORDER — DEXTROSE 50 % IV SOLN: 12.5000 g | INTRAVENOUS | Status: DC

## 2023-07-18 MED ORDER — INSULIN ASPART 100 UNIT/ML IJ SOLN
10.0000 [IU] | Freq: Once | INTRAMUSCULAR | Status: AC
  Administered 2023-07-18: 10 [IU] via SUBCUTANEOUS

## 2023-07-18 MED ORDER — OXYCODONE HCL 5 MG PO TABS
5.0000 mg | ORAL_TABLET | Freq: Four times a day (QID) | ORAL | Status: DC | PRN
  Administered 2023-07-18: 5 mg via ORAL

## 2023-07-18 MED ORDER — DEXTROSE 50 % IV SOLN
INTRAVENOUS | Status: AC
  Administered 2023-07-18: 50 mL via INTRAVENOUS
  Filled 2023-07-18: qty 50

## 2023-07-18 MED ORDER — ACETAMINOPHEN 650 MG RE SUPP
325.0000 mg | RECTAL | Status: DC | PRN
  Administered 2023-07-19: 325 mg via RECTAL
  Filled 2023-07-18: qty 1

## 2023-07-18 MED ORDER — CHLORHEXIDINE GLUCONATE CLOTH 2 % EX PADS
6.0000 | MEDICATED_PAD | Freq: Every day | CUTANEOUS | Status: DC
  Administered 2023-07-18 – 2023-08-10 (×26): 6 via TOPICAL

## 2023-07-18 MED ORDER — VANCOMYCIN HCL 750 MG/150ML IV SOLN
750.0000 mg | Freq: Once | INTRAVENOUS | Status: AC
  Administered 2023-07-18: 750 mg via INTRAVENOUS
  Filled 2023-07-18: qty 150

## 2023-07-18 MED ORDER — HYDROMORPHONE HCL 1 MG/ML IJ SOLN: 0.5000 mg | INTRAMUSCULAR | Status: DC | PRN

## 2023-07-18 NOTE — Progress Notes (Addendum)
This nurse was not informed that patient BS was 58, O2 sat was 74% on RA or that his BP was 155/127. Nurse noticed the documented VS and went to check on patient. BP recheck was 98/87 and O2 was 74% on RA. Patient was also having a wet cough and was placed on supplemental oxygen and was suctioned. Patient lethargic, unable to follow command. D50 was administered, rapid and respiratory therapist was called.  Odie Sera, MD was updated.   Alazar Cherian

## 2023-07-18 NOTE — Significant Event (Signed)
Rapid Response Event Note   Reason for Call : Hypoxia Initial Focused Assessment:  I was notified by nursing staff of [name] dropping his sats to 70% after vomiting. Initially, he was lethargic. CBG 58. One amp D50 given and shortly after he is more awake, oriented x3 Making demands. Pt was placed on NRB mask on 15L at beginning of event. BBS rhonchi right, diminished on Left. Thick congested nonprod cough. No distress. Pt needs frequent redirection to maintain medical devices as he pulls his oxygen off. His lack of understanding or choice to be disruptive is obstructing his care. Awaiting PCXR.   2045-98.27F, HR 108 ST, 113/75 (87), RR 18 with sats 91-93% on [place] HFNC 15L.   Interventions:  -NRB mask titrated down to Salter HFNC at 15L -Stat PCXR -Incentive spirometry 500-600cc x 10 breaths -Continuous pulse oximetry -Wean O2 as tolerated to maintain sats >92%    MD Notified: Dr. Antionette Char per primary RN Call Time: 2004 Arrival Time: 2006 End Time: 2056   Rose Fillers, RN

## 2023-07-18 NOTE — Progress Notes (Signed)
Report was called and given to [facility]. Patient was transported with all of his personal belongings.  [name] was called but unable to reach her. A message was left to call back.   [name]

## 2023-07-18 NOTE — Progress Notes (Signed)
[name] NEPHROLOGY PROGRESS NOTE  Assessment/ Plan: Pt is a 42 y.o. yo male   OP HD: MWF [facility] 4h  400/1.5    77kg   2/3 bath  [facility] TDC    Heparin none - last OP HD 10/7, post wt 74.6kg - coming off 75- 76kg - misses HD once every other week approx - rocaltrol 1.50 mcg  - no esa  # AMS: MRI with acute stroke, neurology is following.  Mental status improving.  # Sepsis - hx of MRSA bacteremia in 03/2023. On vanc. BCx's + for MRSA 10/12, 10/13 NGTD at 5 days. TEE 10/16 with vegetations - CT surgery requests repeat TEE 2 wks. he had a catheter holiday and then femoral TDC was placed by IR on 10/18.  # ESRD - on HD MWF.  Plan for next dialysis tomorrow.  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  # HTN/ BP - BP's are soft in the 90s- 110 range but tolerating HD.  # Volume - no vol excess, at his dry wt. CXR clear.  UF with HD.   # Anemia esrd - Hb 9s. No esa at OP unit - start low dose here. 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 On VDRA, sensipar, renvela.   Subjective:  Seen and examined at bedside.  Denies nausea, vomiting, chest pain or shortness of breath.  No major event.  Objective Vital signs in last 24 hours: Vitals:   07/17/23 2007 07/18/23 0343 07/18/23 0500 07/18/23 0732  BP: 90/71 110/71  105/72  Pulse: 97 (!) 111 100 97  Resp: 18 18  17   Temp: 99 F (37.2 C) 98.4 F (36.9 C)  99.3 F (37.4 C)  TempSrc: Oral   Oral  SpO2: 90% 95%  92%  Weight:   73.4 kg    Weight change: -2 kg  Intake/Output Summary (Last 24 hours) at 07/18/2023 1058 Last data filed at 07/18/2023 0500 Gross per 24 hour  Intake 150 ml  Output --  Net 150 ml       Labs: RENAL PANEL Recent Labs  Lab 07/12/23 0520 07/13/23 0507 07/14/23 0910 07/15/23 0609 07/16/23 1835  NA  131* 132* 127* 131* 131*  K 3.3* 3.6 4.3 4.1 3.5  CL 96* 95* 92* 89* 91*  CO2 22 21* 16* 14* 21*  GLUCOSE 100* 89 191* 332* 223*  BUN 39* 51* 62* 49* 61*  CREATININE 8.83* 10.30* 11.70* 8.54* 10.04*  CALCIUM 8.6* 9.1 8.1* 8.3* 7.9*  PHOS  --   --   --  5.2* 3.7  ALBUMIN  --   --   --   --  2.0*    Liver Function Tests: Recent Labs  Lab 07/16/23 1835  ALBUMIN 2.0*   No results for input(s): "LIPASE", "AMYLASE" in the last 168 hours. No results for input(s): "AMMONIA" in the last 168 hours.  CBC: Recent Labs    07/11/23 0547 07/12/23 0520 07/14/23 0910 07/15/23 0609 07/16/23 1835  HGB 10.2* 10.0* 9.2* 9.3* 8.9*  MCV 84.9 82.4 87.1 88.8 85.3    Cardiac Enzymes: No results for input(s): "CKTOTAL", "CKMB", "CKMBINDEX", "TROPONINI" in the last 168 hours. CBG: Recent Labs  Lab 07/17/23 0725 07/17/23 1144 07/17/23 1554 07/17/23 2010 07/18/23 0731  GLUCAP 188* 118* 175* 175* 244*  Iron Studies: No results for input(s): "IRON", "TIBC", "TRANSFERRIN", "FERRITIN" in the last 72 hours. Studies/Results: VAS US CAROTID  Result Date: 07/16/2023 Carotid Arterial Duplex Study Patient Name:  [name]  Date of Exam:   07/16/2023 Medical Rec #: [id number]      Accession #:    [id number] Date of Birth: 1981-02-19     Patient Gender: M Patient Age:   69 years Exam Location:  [facility] Procedure:      VAS US CAROTID Referring Phys: AMRIT ADHIKARI --------------------------------------------------------------------------------  Indications:   CVA. Risk Factors:  Hypertension, Diabetes, no history of smoking, prior CVA. 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 Limitations    Today's exam was limited due to the patient's inability or                unwillingness to cooperate, the patient's respiratory variation                and Cath in IJ, unable to visualize right subclavian. Performing Technologist: Dondra Prader RVT, RCS  Examination Guidelines: A complete evaluation includes  B-mode imaging, spectral Doppler, color Doppler, and power Doppler as needed of all accessible portions of each vessel. Bilateral testing is considered an integral part of a complete examination. Limited examinations for reoccurring indications may be performed as noted.  Right Carotid Findings: +----------+--------+--------+--------+------------------+--------+           PSV cm/sEDV cm/sStenosisPlaque DescriptionComments +----------+--------+--------+--------+------------------+--------+ CCA Prox  86      18                                         +----------+--------+--------+--------+------------------+--------+ CCA Mid   58      10                                         +----------+--------+--------+--------+------------------+--------+ CCA Distal82      15                                         +----------+--------+--------+--------+------------------+--------+ ICA Prox  69      25      Normal                             +----------+--------+--------+--------+------------------+--------+ ICA Mid   93      33                                         +----------+--------+--------+--------+------------------+--------+ ICA Distal92      28                                         +----------+--------+--------+--------+------------------+--------+ ECA       40                                                 +----------+--------+--------+--------+------------------+--------+ +---------+--------+--+--------+--+---------+  VertebralPSV cm/s45EDV cm/s13Antegrade +---------+--------+--+--------+--+---------+  Left Carotid Findings: +----------+--------+--------+--------+------------------+--------+           PSV cm/sEDV cm/sStenosisPlaque DescriptionComments +----------+--------+--------+--------+------------------+--------+ CCA Prox  120     20                                          +----------+--------+--------+--------+------------------+--------+ CCA Mid   109     21                                         +----------+--------+--------+--------+------------------+--------+ CCA Distal86      11                                         +----------+--------+--------+--------+------------------+--------+ ICA Prox  67      23      Normal                             +----------+--------+--------+--------+------------------+--------+ ICA Mid   100     32                                         +----------+--------+--------+--------+------------------+--------+ ICA Distal89      29                                         +----------+--------+--------+--------+------------------+--------+ ECA       45                                                 +----------+--------+--------+--------+------------------+--------+ +----------+--------+--------+--------+-------------------+           PSV cm/sEDV cm/sDescribeArm Pressure (mmHG) +----------+--------+--------+--------+-------------------+ Subclavian115                                         +----------+--------+--------+--------+-------------------+ +---------+--------+--+--------+--+---------+ VertebralPSV cm/s53EDV cm/s15Antegrade +---------+--------+--+--------+--+---------+   Summary: Right Carotid: There was no evidence of thrombus, dissection, atherosclerotic                plaque or stenosis in the cervical carotid system. Left Carotid: There was no evidence of thrombus, dissection, atherosclerotic               plaque or stenosis in the cervical carotid system. Vertebrals:  Bilateral vertebral arteries demonstrate antegrade flow. Subclavians: Normal flow hemodynamics were seen in the left subclavian artery. *See table(s) above for measurements and observations.  Electronically signed by Sherald Hess MD on 07/16/2023 at 4:24:45 PM.    Final    IR Fluoro Guide CV Line  Left  Result Date: 07/16/2023 INDICATION: ESRD on HD.  Challenging vascular access. EXAM: Title: TUNNELED CENTRAL VENOUS HEMODIALYSIS CATHETER PLACEMENT WITH ULTRASOUND AND FLUOROSCOPIC GUIDANCE Procedures: 1. ULTRASOUND-GUIDED RIGHT JUGULAR, LEFT JUGULAR AND LEFT FEMORAL VENOUS ACCESS 2. RIGHT  JUGULAR VENOGRAPHY 3. LEFT JUGULAR VENOGRAPHY 4. LEFT FEMORAL TUNNELED HEMODIALYSIS CATHETER PLACEMENT MEDICATIONS: Ancef 2 gm IV . The antibiotic was given in an appropriate time interval prior to skin puncture. ANESTHESIA/SEDATION: Moderate (conscious) sedation was employed during this procedure. A total of Versed 1 mg and Fentanyl 50 mcg was administered intravenously. Moderate Sedation Time: 54 minutes. The patient's level of consciousness and vital signs were monitored continuously by radiology nursing throughout the procedure under my direct supervision. FLUOROSCOPY TIME:  Fluoroscopic dose; 61 mGy COMPLICATIONS: None immediate. PROCEDURE: Informed written consent was obtained from the patient and/or patient's representative after a discussion of the risks, benefits, and alternatives to treatment. Questions regarding the procedure were encouraged and answered. The operative sites were prepped with chlorhexidine in a sterile fashion, and a sterile drape was applied covering the operative field. Maximum barrier sterile technique with sterile gowns and gloves were used for the procedure. A timeout was performed prior to the initiation of the procedure. Initial access was obtained at the RIGHT neck, however the microwire would not advance. RIGHT jugular venography was then performed. The RIGHT neck access site was aborted. Access was then obtained at the LEFT neck, however the microwire would not advance into the SVC. LEFT jugular venography was also performed. The LEFT neck access site was also aborted. Sonographic evaluation of the LEFT groin demonstrated a widely patent greater saphenous and femoral veins. After  creating a small venotomy incision, a micropuncture kit was utilized to access the LEFT greater saphenous vein. Real-time ultrasound guidance was utilized for vascular access including the acquisition of a permanent ultrasound image documenting patency of the accessed vessel. The microwire was utilized to measure appropriate catheter length. A stiff Glidewire was advanced to the level of the IVC and the micropuncture sheath was exchanged for a peel-away sheath. A palindrome tunneled hemodialysis catheter measuring 33 cm from tip to cuff was tunneled in a retrograde fashion from the lateral LEFT thigh to the venotomy incision. The catheter was then placed through the peel-away sheath with tips ultimately positioned within the infrarenal IVC. Final catheter positioning was confirmed and documented with a spot radiographic image. The catheter aspirates and flushes normally. The catheter was flushed with appropriate volume heparin dwells. The catheter exit site was secured with a 2-0 Ethilon retention suture. The venotomy incision was closed with Dermabond. Dressings were applied. The patient tolerated the procedure well without immediate post procedural complication. IMPRESSION: 1. Occluded superior vena cava, as demonstrated by RIGHT and LEFT jugular venography. 2. Successful placement of a 33 cm tip to cuff tunneled hemodialysis catheter via the LEFT femoral vein. The tip of the catheter is positioned infrarenal IVC. The catheter is ready for immediate use. RECOMMENDATIONS: Challenging vascular access patient. DO NOT REMOVE THE PATIENT'S HEMODIALYSIS CATHETER without consulting vascular and Interventional Radiology (VIR). Roanna Banning, MD [facility] Radiology Electronically Signed   By: Roanna Banning M.D.   On: 07/16/2023 13:43   IR US Guide Vasc Access Left  Result Date: 07/16/2023 INDICATION: ESRD on HD.  Challenging vascular access. EXAM: Title: TUNNELED CENTRAL  VENOUS HEMODIALYSIS CATHETER PLACEMENT WITH ULTRASOUND AND FLUOROSCOPIC GUIDANCE Procedures: 1. ULTRASOUND-GUIDED RIGHT JUGULAR, LEFT JUGULAR AND LEFT FEMORAL VENOUS ACCESS 2. RIGHT JUGULAR VENOGRAPHY 3. LEFT JUGULAR VENOGRAPHY 4. LEFT FEMORAL TUNNELED HEMODIALYSIS CATHETER PLACEMENT MEDICATIONS: Ancef 2 gm IV . The antibiotic was given in an appropriate time interval prior to skin puncture. ANESTHESIA/SEDATION: Moderate (conscious) sedation was employed during this procedure. A total of Versed 1  mg and Fentanyl 50 mcg was administered intravenously. Moderate Sedation Time: 54 minutes. The patient's level of consciousness and vital signs were monitored continuously by radiology nursing throughout the procedure under my direct supervision. FLUOROSCOPY TIME:  Fluoroscopic dose; 61 mGy COMPLICATIONS: None immediate. PROCEDURE: Informed written consent was obtained from the patient and/or patient's representative after a discussion of the risks, benefits, and alternatives to treatment. Questions regarding the procedure were encouraged and answered. The operative sites were prepped with chlorhexidine in a sterile fashion, and a sterile drape was applied covering the operative field. Maximum barrier sterile technique with sterile gowns and gloves were used for the procedure. A timeout was performed prior to the initiation of the procedure. Initial access was obtained at the RIGHT neck, however the microwire would not advance. RIGHT jugular venography was then performed. The RIGHT neck access site was aborted. Access was then obtained at the LEFT neck, however the microwire would not advance into the SVC. LEFT jugular venography was also performed. The LEFT neck access site was also aborted. Sonographic evaluation of the LEFT groin demonstrated a widely patent greater saphenous and femoral veins. After creating a small venotomy incision, a micropuncture kit was utilized to access the LEFT greater saphenous vein. Real-time  ultrasound guidance was utilized for vascular access including the acquisition of a permanent ultrasound image documenting patency of the accessed vessel. The microwire was utilized to measure appropriate catheter length. A stiff Glidewire was advanced to the level of the IVC and the micropuncture sheath was exchanged for a peel-away sheath. A palindrome tunneled hemodialysis catheter measuring 33 cm from tip to cuff was tunneled in a retrograde fashion from the lateral LEFT thigh to the venotomy incision. The catheter was then placed through the peel-away sheath with tips ultimately positioned within the infrarenal IVC. Final catheter positioning was confirmed and documented with a spot radiographic image. The catheter aspirates and flushes normally. The catheter was flushed with appropriate volume heparin dwells. The catheter exit site was secured with a 2-0 Ethilon retention suture. The venotomy incision was closed with Dermabond. Dressings were applied. The patient tolerated the procedure well without immediate post procedural complication. IMPRESSION: 1. Occluded superior vena cava, as demonstrated by RIGHT and LEFT jugular venography. 2. Successful placement of a 33 cm tip to cuff tunneled hemodialysis catheter via the LEFT femoral vein. The tip of the catheter is positioned infrarenal IVC. The catheter is ready for immediate use. RECOMMENDATIONS: Challenging vascular access patient. DO NOT REMOVE THE PATIENT'S HEMODIALYSIS CATHETER without consulting vascular and Interventional Radiology (VIR). Roanna Banning, MD [facility] Radiology Electronically Signed   By: Roanna Banning M.D.   On: 07/16/2023 13:43   IR Veno/Jugular Right  Result Date: 07/16/2023 INDICATION: ESRD on HD.  Challenging vascular access. EXAM: Title: TUNNELED CENTRAL VENOUS HEMODIALYSIS CATHETER PLACEMENT WITH ULTRASOUND AND FLUOROSCOPIC GUIDANCE Procedures: 1. ULTRASOUND-GUIDED RIGHT JUGULAR,  LEFT JUGULAR AND LEFT FEMORAL VENOUS ACCESS 2. RIGHT JUGULAR VENOGRAPHY 3. LEFT JUGULAR VENOGRAPHY 4. LEFT FEMORAL TUNNELED HEMODIALYSIS CATHETER PLACEMENT MEDICATIONS: Ancef 2 gm IV . The antibiotic was given in an appropriate time interval prior to skin puncture. ANESTHESIA/SEDATION: Moderate (conscious) sedation was employed during this procedure. A total of Versed 1 mg and Fentanyl 50 mcg was administered intravenously. Moderate Sedation Time: 54 minutes. The patient's level of consciousness and vital signs were monitored continuously by radiology nursing throughout the procedure under my direct supervision. FLUOROSCOPY TIME:  Fluoroscopic dose; 61 mGy COMPLICATIONS: None immediate. PROCEDURE: Informed written consent was  obtained from the patient and/or patient's representative after a discussion of the risks, benefits, and alternatives to treatment. Questions regarding the procedure were encouraged and answered. The operative sites were prepped with chlorhexidine in a sterile fashion, and a sterile drape was applied covering the operative field. Maximum barrier sterile technique with sterile gowns and gloves were used for the procedure. A timeout was performed prior to the initiation of the procedure. Initial access was obtained at the RIGHT neck, however the microwire would not advance. RIGHT jugular venography was then performed. The RIGHT neck access site was aborted. Access was then obtained at the LEFT neck, however the microwire would not advance into the SVC. LEFT jugular venography was also performed. The LEFT neck access site was also aborted. Sonographic evaluation of the LEFT groin demonstrated a widely patent greater saphenous and femoral veins. After creating a small venotomy incision, a micropuncture kit was utilized to access the LEFT greater saphenous vein. Real-time ultrasound guidance was utilized for vascular access including the acquisition of a permanent ultrasound image documenting  patency of the accessed vessel. The microwire was utilized to measure appropriate catheter length. A stiff Glidewire was advanced to the level of the IVC and the micropuncture sheath was exchanged for a peel-away sheath. A palindrome tunneled hemodialysis catheter measuring 33 cm from tip to cuff was tunneled in a retrograde fashion from the lateral LEFT thigh to the venotomy incision. The catheter was then placed through the peel-away sheath with tips ultimately positioned within the infrarenal IVC. Final catheter positioning was confirmed and documented with a spot radiographic image. The catheter aspirates and flushes normally. The catheter was flushed with appropriate volume heparin dwells. The catheter exit site was secured with a 2-0 Ethilon retention suture. The venotomy incision was closed with Dermabond. Dressings were applied. The patient tolerated the procedure well without immediate post procedural complication. IMPRESSION: 1. Occluded superior vena cava, as demonstrated by RIGHT and LEFT jugular venography. 2. Successful placement of a 33 cm tip to cuff tunneled hemodialysis catheter via the LEFT femoral vein. The tip of the catheter is positioned infrarenal IVC. The catheter is ready for immediate use. RECOMMENDATIONS: Challenging vascular access patient. DO NOT REMOVE THE PATIENT'S HEMODIALYSIS CATHETER without consulting vascular and Interventional Radiology (VIR). Roanna Banning, MD [facility] Radiology Special[facility] Radiology Electronically Signed   By: Roanna Banning M.D.   On: 07/16/2023 13:43   IR Veno/Jugular Left  Result Date: 07/16/2023 INDICATION: ESRD on HD.  Challenging vascular access. EXAM: Title: TUNNELED CENTRAL VENOUS HEMODIALYSIS CATHETER PLACEMENT WITH ULTRASOUND AND FLUOROSCOPIC GUIDANCE Procedures: 1. ULTRASOUND-GUIDED RIGHT JUGULAR, LEFT JUGULAR AND LEFT FEMORAL VENOUS ACCESS 2. RIGHT JUGULAR VENOGRAPHY 3. LEFT JUGULAR VENOGRAPHY 4. LEFT FEMORAL TUNNELED  HEMODIALYSIS CATHETER PLACEMENT MEDICATIONS: Ancef 2 gm IV . The antibiotic was given in an appropriate time interval prior to skin puncture. ANESTHESIA/SEDATION: Moderate (conscious) sedation was employed during this procedure. A total of Versed 1 mg and Fentanyl 50 mcg was administered intravenously. Moderate Sedation Time: 54 minutes. The patient's level of consciousness and vital signs were monitored continuously by radiology nursing throughout the procedure under my direct supervision. FLUOROSCOPY TIME:  Fluoroscopic dose; 61 mGy COMPLICATIONS: None immediate. PROCEDURE: Informed written consent was obtained from the patient and/or patient's representative after a discussion of the risks, benefits, and alternatives to treatment. Questions regarding the procedure were encouraged and answered. The operative sites were prepped with chlorhexidine in a sterile fashion, and a sterile drape was applied covering the operative field. Maximum barrier  sterile technique with sterile gowns and gloves were used for the procedure. A timeout was performed prior to the initiation of the procedure. Initial access was obtained at the RIGHT neck, however the microwire would not advance. RIGHT jugular venography was then performed. The RIGHT neck access site was aborted. Access was then obtained at the LEFT neck, however the microwire would not advance into the SVC. LEFT jugular venography was also performed. The LEFT neck access site was also aborted. Sonographic evaluation of the LEFT groin demonstrated a widely patent greater saphenous and femoral veins. After creating a small venotomy incision, a micropuncture kit was utilized to access the LEFT greater saphenous vein. Real-time ultrasound guidance was utilized for vascular access including the acquisition of a permanent ultrasound image documenting patency of the accessed vessel. The microwire was utilized to measure appropriate catheter length. A stiff Glidewire was advanced  to the level of the IVC and the micropuncture sheath was exchanged for a peel-away sheath. A palindrome tunneled hemodialysis catheter measuring 33 cm from tip to cuff was tunneled in a retrograde fashion from the lateral LEFT thigh to the venotomy incision. The catheter was then placed through the peel-away sheath with tips ultimately positioned within the infrarenal IVC. Final catheter positioning was confirmed and documented with a spot radiographic image. The catheter aspirates and flushes normally. The catheter was flushed with appropriate volume heparin dwells. The catheter exit site was secured with a 2-0 Ethilon retention suture. The venotomy incision was closed with Dermabond. Dressings were applied. The patient tolerated the procedure well without immediate post procedural complication. IMPRESSION: 1. Occluded superior vena cava, as demonstrated by RIGHT and LEFT jugular venography. 2. Successful placement of a 33 cm tip to cuff tunneled hemodialysis catheter via the LEFT femoral vein. The tip of the catheter is positioned infrarenal IVC. The catheter is ready for immediate use. RECOMMENDATIONS: Challenging vascular access patient. DO NOT REMOVE THE PATIENT'S HEMODIALYSIS CATHETER without consulting vascular and Interventional Radiology (VIR). Roanna Banning, MD [facility]iology [facility] Radiology Electronically Signed   By: Roanna Banning M.D.   On: 07/16/2023 13:43   IR US Guide Vasc Access Right  Result Date: 07/16/2023 INDICATION: ESRD on HD.  Challenging vascular access. EXAM: Title: TUNNELED CENTRAL VENOUS HEMODIALYSIS CATHETER PLACEMENT WITH ULTRASOUND AND FLUOROSCOPIC GUIDANCE Procedures: 1. ULTRASOUND-GUIDED RIGHT JUGULAR, LEFT JUGULAR AND LEFT FEMORAL VENOUS ACCESS 2. RIGHT JUGULAR VENOGRAPHY 3. LEFT JUGULAR VENOGRAPHY 4. LEFT FEMORAL TUNNELED HEMODIALYSIS CATHETER PLACEMENT MEDICATIONS: Ancef 2 gm IV . The antibiotic was given in an appropriate time interval  prior to skin puncture. ANESTHESIA/SEDATION: Moderate (conscious) sedation was employed during this procedure. A total of Versed 1 mg and Fentanyl 50 mcg was administered intravenously. Moderate Sedation Time: 54 minutes. The patient's level of consciousness and vital signs were monitored continuously by radiology nursing throughout the procedure under my direct supervision. FLUOROSCOPY TIME:  Fluoroscopic dose; 61 mGy COMPLICATIONS: None immediate. PROCEDURE: Informed written consent was obtained from the patient and/or patient's representative after a discussion of the risks, benefits, and alternatives to treatment. Questions regarding the procedure were encouraged and answered. The operative sites were prepped with chlorhexidine in a sterile fashion, and a sterile drape was applied covering the operative field. Maximum barrier sterile technique with sterile gowns and gloves were used for the procedure. A timeout was performed prior to the initiation of the procedure. Initial access was obtained at the RIGHT neck, however the microwire would not advance. RIGHT jugular venography was then performed. The RIGHT neck  access site was aborted. Access was then obtained at the LEFT neck, however the microwire would not advance into the SVC. LEFT jugular venography was also performed. The LEFT neck access site was also aborted. Sonographic evaluation of the LEFT groin demonstrated a widely patent greater saphenous and femoral veins. After creating a small venotomy incision, a micropuncture kit was utilized to access the LEFT greater saphenous vein. Real-time ultrasound guidance was utilized for vascular access including the acquisition of a permanent ultrasound image documenting patency of the accessed vessel. The microwire was utilized to measure appropriate catheter length. A stiff Glidewire was advanced to the level of the IVC and the micropuncture sheath was exchanged for a peel-away sheath. A palindrome tunneled  hemodialysis catheter measuring 33 cm from tip to cuff was tunneled in a retrograde fashion from the lateral LEFT thigh to the venotomy incision. The catheter was then placed through the peel-away sheath with tips ultimately positioned within the infrarenal IVC. Final catheter positioning was confirmed and documented with a spot radiographic image. The catheter aspirates and flushes normally. The catheter was flushed with appropriate volume heparin dwells. The catheter exit site was secured with a 2-0 Ethilon retention suture. The venotomy incision was closed with Dermabond. Dressings were applied. The patient tolerated the procedure well without immediate post procedural complication. IMPRESSION: 1. Occluded superior vena cava, as demonstrated by RIGHT and LEFT jugular venography. 2. Successful placement of a 33 cm tip to cuff tunneled hemodialysis catheter via the LEFT femoral vein. The tip of the catheter is positioned infrarenal IVC. The catheter is ready for immediate use. RECOMMENDATIONS: Challenging vascular access patient. DO NOT REMOVE THE PATIENT'S HEMODIALYSIS CATHETER without consulting vascular and Interventional Radiology (VIR). Roanna Banning, MD [facility] Radiology Electronically Signed   By: Roanna Banning M.D.   On: 07/16/2023 13:43   IR US Guide Vasc Access Left  Result Date: 07/16/2023 INDICATION: ESRD on HD.  Challenging vascular access. EXAM: Title: TUNNELED CENTRAL VENOUS HEMODIALYSIS CATHETER PLACEMENT WITH ULTRASOUND AND FLUOROSCOPIC GUIDANCE Procedures: 1. ULTRASOUND-GUIDED RIGHT JUGULAR, LEFT JUGULAR AND LEFT FEMORAL VENOUS ACCESS 2. RIGHT JUGULAR VENOGRAPHY 3. LEFT JUGULAR VENOGRAPHY 4. LEFT FEMORAL TUNNELED HEMODIALYSIS CATHETER PLACEMENT MEDICATIONS: Ancef 2 gm IV . The antibiotic was given in an appropriate time interval prior to skin puncture. ANESTHESIA/SEDATION: Moderate (conscious) sedation was employed during this procedure. A  total of Versed 1 mg and Fentanyl 50 mcg was administered intravenously. Moderate Sedation Time: 54 minutes. The patient's level of consciousness and vital signs were monitored continuously by radiology nursing throughout the procedure under my direct supervision. FLUOROSCOPY TIME:  Fluoroscopic dose; 61 mGy COMPLICATIONS: None immediate. PROCEDURE: Informed written consent was obtained from the patient and/or patient's representative after a discussion of the risks, benefits, and alternatives to treatment. Questions regarding the procedure were encouraged and answered. The operative sites were prepped with chlorhexidine in a sterile fashion, and a sterile drape was applied covering the operative field. Maximum barrier sterile technique with sterile gowns and gloves were used for the procedure. A timeout was performed prior to the initiation of the procedure. Initial access was obtained at the RIGHT neck, however the microwire would not advance. RIGHT jugular venography was then performed. The RIGHT neck access site was aborted. Access was then obtained at the LEFT neck, however the microwire would not advance into the SVC. LEFT jugular venography was also performed. The LEFT neck access site was also aborted. Sonographic evaluation of the LEFT groin demonstrated a widely patent greater  saphenous and femoral veins. After creating a small venotomy incision, a micropuncture kit was utilized to access the LEFT greater saphenous vein. Real-time ultrasound guidance was utilized for vascular access including the acquisition of a permanent ultrasound image documenting patency of the accessed vessel. The microwire was utilized to measure appropriate catheter length. A stiff Glidewire was advanced to the level of the IVC and the micropuncture sheath was exchanged for a peel-away sheath. A palindrome tunneled hemodialysis catheter measuring 33 cm from tip to cuff was tunneled in a retrograde fashion from the lateral LEFT  thigh to the venotomy incision. The catheter was then placed through the peel-away sheath with tips ultimately positioned within the infrarenal IVC. Final catheter positioning was confirmed and documented with a spot radiographic image. The catheter aspirates and flushes normally. The catheter was flushed with appropriate volume heparin dwells. The catheter exit site was secured with a 2-0 Ethilon retention suture. The venotomy incision was closed with Dermabond. Dressings were applied. The patient tolerated the procedure well without immediate post procedural complication. IMPRESSION: 1. Occluded superior vena cava, as demonstrated by RIGHT and LEFT jugular venography. 2. Successful placement of a 33 cm tip to cuff tunneled hemodialysis catheter via the LEFT femoral vein. The tip of the catheter is positioned infrarenal IVC. The catheter is ready for immediate use. RECOMMENDATIONS: Challenging vascular access patient. DO NOT REMOVE THE PATIENT'S HEMODIALYSIS CATHETER without consulting vascular and Interventional Radiology (VIR). Roanna Banning, MD [facility] Radiology Electronically Signed   By: Roanna Banning M.D.   On: 07/16/2023 13:43    Medications: Infusions:  vancomycin Stopped (07/14/23 1842)   vancomycin      Scheduled Medications:  calcitRIOL  1.5 mcg Oral Q M,W,F-HD   Chlorhexidine Gluconate Cloth  6 each Topical Q0600   cinacalcet  120 mg Oral Q supper   darbepoetin (ARANESP) injection - DIALYSIS  40 mcg Subcutaneous Q Fri-1800   famotidine  10 mg Oral Daily   feeding supplement (GLUCERNA SHAKE)  237 mL Oral TID BM   gabapentin  100 mg Oral TID   heparin sodium (porcine)  4,600 Units Intracatheter Once   insulin aspart  0-6 Units Subcutaneous TID WC   insulin glargine-yfgn  10 Units Subcutaneous Daily   multivitamin with minerals  1 tablet Oral QHS   sevelamer carbonate  2,400 mg Oral TID WC    have reviewed scheduled and prn  medications.  Physical Exam: General:NAD, comfortable Heart:RRR, s1s2 nl Lungs:clear b/l, no crackle Abdomen:soft, Non-tender, non-distended Extremities:No edema Dialysis Access: Femoral TDC site clean.  [name] 07/18/2023,10:58 AM  LOS: 8 days

## 2023-07-18 NOTE — Progress Notes (Addendum)
Pharmacy Antibiotic Note  [name] is a 42 y.o. male admitted on 07/09/2023 with sepsis, found down. MRSA bacteremia. ESRD- last HD ended 10/16 at ~16:00. Vancomycin 750mg  given after HD. Next HD on Friday.   Vancomycin random level of 25 (10/17 15:52). Goal 15-25 in HD patients. His dialysis session on 10/16 ran for 3.25 hours but blood flow rates were ~250 mL/min for most of the session. If dialysis BFR continues to be < 350 mL/min with each HD session, would consider lowering HD dose of vancomycin to 500 mg post HD.   10/20: Last HD session on Friday, 10/18. Noted patient never received dose on Friday with HD. HD treatment lasted ~3 hours with BFR 400 mL/min. Blood cultures from 10/17 have no growth, but are not yet finalized results. WBC remain slightly elevated at 14.9, patient remains afebrile.    Plan: Give vancomycin 750 mg x1 today due to missed dose on Friday Continue Vancomycin 750 mg post HD MWF - next HD session on Monday Consider rechecking VR level next week F/u culture data for antibiotic regimen, continue until finalized  Weight: 73.4 kg (161 lb 13.1 oz)  Temp (24hrs), Avg:98.8 F (37.1 C), Min:98.4 F (36.9 C), Max:99.3 F (37.4 C)  Recent Labs  Lab 07/12/23 0520 07/13/23 0507 07/14/23 0910 07/15/23 0609 07/15/23 1500 07/16/23 1835  WBC 12.1*  --  15.3* 13.7*  --  14.9*  CREATININE 8.83* 10.30* 11.70* 8.54*  --  10.04*  VANCORANDOM  --   --   --   --  25  --     Estimated Creatinine Clearance: 8.9 mL/min (A) (by C-G formula based on SCr of 10.04 mg/dL (H)).    No Known Allergies  Microbiology results: 10/112 covid negative 10/12 BCx: MRSA 10/12 MRSA PCR: positive 10/12 Cdiff PCR positive, toxin negative 10/13 Bcx: ngtdF 10/17 Bcx: ngtd  Thank you for allowing pharmacy to be a part of this patient's care.  Enos Fling, PharmD PGY-1 Acute Care Pharmacy Resident 07/18/2023 9:06 AM

## 2023-07-18 NOTE — Progress Notes (Signed)
Collection Time: 07/11/23  8:44 AM   Specimen: BLOOD RIGHT ARM  Result Value Ref Range Status   Specimen Description BLOOD RIGHT ARM  Final   Special Requests   Final    BOTTLES DRAWN AEROBIC ONLY Blood Culture adequate volume   Culture   Final    NO GROWTH 5 DAYS Performed at [facility], [address], [place], [name] [address]    Report Status 07/16/2023 FINAL  Final  Culture, blood (Routine X 2) w Reflex to ID Panel     Status: None (Preliminary result)   Collection Time: 07/15/23  3:00 PM   Specimen: BLOOD RIGHT ARM  Result Value Ref Range Status   Specimen Description BLOOD RIGHT ARM  Final   Special Requests   Final    BOTTLES DRAWN AEROBIC AND ANAEROBIC Blood Culture adequate  volume   Culture   Final    NO GROWTH 3 DAYS Performed at [facility], [address], [place], [name] [address]    Report Status PENDING  Incomplete  Culture, blood (Routine X 2) w Reflex to ID Panel     Status: None (Preliminary result)   Collection Time: 07/15/23  3:02 PM   Specimen: BLOOD RIGHT ARM  Result Value Ref Range Status   Specimen Description BLOOD RIGHT ARM  Final   Special Requests   Final    BOTTLES DRAWN AEROBIC AND ANAEROBIC Blood Culture adequate volume   Culture   Final    NO GROWTH 3 DAYS Performed at [facility], [address], [place], [name] [address]    Report Status PENDING  Incomplete     Radiology Studies: VAS US CAROTID  Result Date: 07/16/2023 Carotid Arterial Duplex Study Patient Name:  [name]  Date of Exam:   07/16/2023 Medical Rec #: [id number]      Accession #:    [id number] Date of Birth: April 07, 1981     Patient Gender: M Patient Age:   42 years Exam Location:  [facility] Procedure:      VAS US CAROTID Referring Phys: Teri Diltz --------------------------------------------------------------------------------  Indications:   CVA. Risk Factors:  Hypertension, Diabetes, no history of smoking, prior CVA. 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 Limitations    Today's exam was limited due to the patient's inability or                unwillingness to cooperate, the patient's respiratory variation                and Cath in IJ, unable to visualize right subclavian. Performing Technologist: Dondra Prader RVT, RCS  Examination Guidelines: A complete evaluation includes B-mode imaging, spectral Doppler, color Doppler, and power Doppler as needed of all accessible portions of each vessel. Bilateral testing is considered an integral part of a complete examination. Limited examinations for reoccurring indications may be performed as noted.  Right Carotid Findings: +----------+--------+--------+--------+------------------+--------+            PSV cm/sEDV cm/sStenosisPlaque DescriptionComments +----------+--------+--------+--------+------------------+--------+ CCA Prox  86      18                                         +----------+--------+--------+--------+------------------+--------+ CCA Mid   58      10                                         +----------+--------+--------+--------+------------------+--------+  Collection Time: 07/11/23  8:44 AM   Specimen: BLOOD RIGHT ARM  Result Value Ref Range Status   Specimen Description BLOOD RIGHT ARM  Final   Special Requests   Final    BOTTLES DRAWN AEROBIC ONLY Blood Culture adequate volume   Culture   Final    NO GROWTH 5 DAYS Performed at [facility], [address], [place], [name] [address]    Report Status 07/16/2023 FINAL  Final  Culture, blood (Routine X 2) w Reflex to ID Panel     Status: None (Preliminary result)   Collection Time: 07/15/23  3:00 PM   Specimen: BLOOD RIGHT ARM  Result Value Ref Range Status   Specimen Description BLOOD RIGHT ARM  Final   Special Requests   Final    BOTTLES DRAWN AEROBIC AND ANAEROBIC Blood Culture adequate  volume   Culture   Final    NO GROWTH 3 DAYS Performed at [facility], [address], [place], [name] [address]    Report Status PENDING  Incomplete  Culture, blood (Routine X 2) w Reflex to ID Panel     Status: None (Preliminary result)   Collection Time: 07/15/23  3:02 PM   Specimen: BLOOD RIGHT ARM  Result Value Ref Range Status   Specimen Description BLOOD RIGHT ARM  Final   Special Requests   Final    BOTTLES DRAWN AEROBIC AND ANAEROBIC Blood Culture adequate volume   Culture   Final    NO GROWTH 3 DAYS Performed at [facility], [address], [place], [name] [address]    Report Status PENDING  Incomplete     Radiology Studies: VAS US CAROTID  Result Date: 07/16/2023 Carotid Arterial Duplex Study Patient Name:  [name]  Date of Exam:   07/16/2023 Medical Rec #: [id number]      Accession #:    [id number] Date of Birth: April 07, 1981     Patient Gender: M Patient Age:   42 years Exam Location:  [facility] Procedure:      VAS US CAROTID Referring Phys: Teri Diltz --------------------------------------------------------------------------------  Indications:   CVA. Risk Factors:  Hypertension, Diabetes, no history of smoking, prior CVA. 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 Limitations    Today's exam was limited due to the patient's inability or                unwillingness to cooperate, the patient's respiratory variation                and Cath in IJ, unable to visualize right subclavian. Performing Technologist: Dondra Prader RVT, RCS  Examination Guidelines: A complete evaluation includes B-mode imaging, spectral Doppler, color Doppler, and power Doppler as needed of all accessible portions of each vessel. Bilateral testing is considered an integral part of a complete examination. Limited examinations for reoccurring indications may be performed as noted.  Right Carotid Findings: +----------+--------+--------+--------+------------------+--------+            PSV cm/sEDV cm/sStenosisPlaque DescriptionComments +----------+--------+--------+--------+------------------+--------+ CCA Prox  86      18                                         +----------+--------+--------+--------+------------------+--------+ CCA Mid   58      10                                         +----------+--------+--------+--------+------------------+--------+  Collection Time: 07/11/23  8:44 AM   Specimen: BLOOD RIGHT ARM  Result Value Ref Range Status   Specimen Description BLOOD RIGHT ARM  Final   Special Requests   Final    BOTTLES DRAWN AEROBIC ONLY Blood Culture adequate volume   Culture   Final    NO GROWTH 5 DAYS Performed at [facility], [address], [place], [name] [address]    Report Status 07/16/2023 FINAL  Final  Culture, blood (Routine X 2) w Reflex to ID Panel     Status: None (Preliminary result)   Collection Time: 07/15/23  3:00 PM   Specimen: BLOOD RIGHT ARM  Result Value Ref Range Status   Specimen Description BLOOD RIGHT ARM  Final   Special Requests   Final    BOTTLES DRAWN AEROBIC AND ANAEROBIC Blood Culture adequate  volume   Culture   Final    NO GROWTH 3 DAYS Performed at [facility], [address], [place], [name] [address]    Report Status PENDING  Incomplete  Culture, blood (Routine X 2) w Reflex to ID Panel     Status: None (Preliminary result)   Collection Time: 07/15/23  3:02 PM   Specimen: BLOOD RIGHT ARM  Result Value Ref Range Status   Specimen Description BLOOD RIGHT ARM  Final   Special Requests   Final    BOTTLES DRAWN AEROBIC AND ANAEROBIC Blood Culture adequate volume   Culture   Final    NO GROWTH 3 DAYS Performed at [facility], [address], [place], [name] [address]    Report Status PENDING  Incomplete     Radiology Studies: VAS US CAROTID  Result Date: 07/16/2023 Carotid Arterial Duplex Study Patient Name:  [name]  Date of Exam:   07/16/2023 Medical Rec #: [id number]      Accession #:    [id number] Date of Birth: April 07, 1981     Patient Gender: M Patient Age:   42 years Exam Location:  [facility] Procedure:      VAS US CAROTID Referring Phys: Teri Diltz --------------------------------------------------------------------------------  Indications:   CVA. Risk Factors:  Hypertension, Diabetes, no history of smoking, prior CVA. 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 Limitations    Today's exam was limited due to the patient's inability or                unwillingness to cooperate, the patient's respiratory variation                and Cath in IJ, unable to visualize right subclavian. Performing Technologist: Dondra Prader RVT, RCS  Examination Guidelines: A complete evaluation includes B-mode imaging, spectral Doppler, color Doppler, and power Doppler as needed of all accessible portions of each vessel. Bilateral testing is considered an integral part of a complete examination. Limited examinations for reoccurring indications may be performed as noted.  Right Carotid Findings: +----------+--------+--------+--------+------------------+--------+            PSV cm/sEDV cm/sStenosisPlaque DescriptionComments +----------+--------+--------+--------+------------------+--------+ CCA Prox  86      18                                         +----------+--------+--------+--------+------------------+--------+ CCA Mid   58      10                                         +----------+--------+--------+--------+------------------+--------+  Collection Time: 07/11/23  8:44 AM   Specimen: BLOOD RIGHT ARM  Result Value Ref Range Status   Specimen Description BLOOD RIGHT ARM  Final   Special Requests   Final    BOTTLES DRAWN AEROBIC ONLY Blood Culture adequate volume   Culture   Final    NO GROWTH 5 DAYS Performed at [facility], [address], [place], [name] [address]    Report Status 07/16/2023 FINAL  Final  Culture, blood (Routine X 2) w Reflex to ID Panel     Status: None (Preliminary result)   Collection Time: 07/15/23  3:00 PM   Specimen: BLOOD RIGHT ARM  Result Value Ref Range Status   Specimen Description BLOOD RIGHT ARM  Final   Special Requests   Final    BOTTLES DRAWN AEROBIC AND ANAEROBIC Blood Culture adequate  volume   Culture   Final    NO GROWTH 3 DAYS Performed at [facility], [address], [place], [name] [address]    Report Status PENDING  Incomplete  Culture, blood (Routine X 2) w Reflex to ID Panel     Status: None (Preliminary result)   Collection Time: 07/15/23  3:02 PM   Specimen: BLOOD RIGHT ARM  Result Value Ref Range Status   Specimen Description BLOOD RIGHT ARM  Final   Special Requests   Final    BOTTLES DRAWN AEROBIC AND ANAEROBIC Blood Culture adequate volume   Culture   Final    NO GROWTH 3 DAYS Performed at [facility], [address], [place], [name] [address]    Report Status PENDING  Incomplete     Radiology Studies: VAS US CAROTID  Result Date: 07/16/2023 Carotid Arterial Duplex Study Patient Name:  [name]  Date of Exam:   07/16/2023 Medical Rec #: [id number]      Accession #:    [id number] Date of Birth: April 07, 1981     Patient Gender: M Patient Age:   42 years Exam Location:  [facility] Procedure:      VAS US CAROTID Referring Phys: Teri Diltz --------------------------------------------------------------------------------  Indications:   CVA. Risk Factors:  Hypertension, Diabetes, no history of smoking, prior CVA. 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 Limitations    Today's exam was limited due to the patient's inability or                unwillingness to cooperate, the patient's respiratory variation                and Cath in IJ, unable to visualize right subclavian. Performing Technologist: Dondra Prader RVT, RCS  Examination Guidelines: A complete evaluation includes B-mode imaging, spectral Doppler, color Doppler, and power Doppler as needed of all accessible portions of each vessel. Bilateral testing is considered an integral part of a complete examination. Limited examinations for reoccurring indications may be performed as noted.  Right Carotid Findings: +----------+--------+--------+--------+------------------+--------+            PSV cm/sEDV cm/sStenosisPlaque DescriptionComments +----------+--------+--------+--------+------------------+--------+ CCA Prox  86      18                                         +----------+--------+--------+--------+------------------+--------+ CCA Mid   58      10                                         +----------+--------+--------+--------+------------------+--------+  Collection Time: 07/11/23  8:44 AM   Specimen: BLOOD RIGHT ARM  Result Value Ref Range Status   Specimen Description BLOOD RIGHT ARM  Final   Special Requests   Final    BOTTLES DRAWN AEROBIC ONLY Blood Culture adequate volume   Culture   Final    NO GROWTH 5 DAYS Performed at [facility], [address], [place], [name] [address]    Report Status 07/16/2023 FINAL  Final  Culture, blood (Routine X 2) w Reflex to ID Panel     Status: None (Preliminary result)   Collection Time: 07/15/23  3:00 PM   Specimen: BLOOD RIGHT ARM  Result Value Ref Range Status   Specimen Description BLOOD RIGHT ARM  Final   Special Requests   Final    BOTTLES DRAWN AEROBIC AND ANAEROBIC Blood Culture adequate  volume   Culture   Final    NO GROWTH 3 DAYS Performed at [facility], [address], [place], [name] [address]    Report Status PENDING  Incomplete  Culture, blood (Routine X 2) w Reflex to ID Panel     Status: None (Preliminary result)   Collection Time: 07/15/23  3:02 PM   Specimen: BLOOD RIGHT ARM  Result Value Ref Range Status   Specimen Description BLOOD RIGHT ARM  Final   Special Requests   Final    BOTTLES DRAWN AEROBIC AND ANAEROBIC Blood Culture adequate volume   Culture   Final    NO GROWTH 3 DAYS Performed at [facility], [address], [place], [name] [address]    Report Status PENDING  Incomplete     Radiology Studies: VAS US CAROTID  Result Date: 07/16/2023 Carotid Arterial Duplex Study Patient Name:  [name]  Date of Exam:   07/16/2023 Medical Rec #: [id number]      Accession #:    [id number] Date of Birth: April 07, 1981     Patient Gender: M Patient Age:   42 years Exam Location:  [facility] Procedure:      VAS US CAROTID Referring Phys: Teri Diltz --------------------------------------------------------------------------------  Indications:   CVA. Risk Factors:  Hypertension, Diabetes, no history of smoking, prior CVA. 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 Limitations    Today's exam was limited due to the patient's inability or                unwillingness to cooperate, the patient's respiratory variation                and Cath in IJ, unable to visualize right subclavian. Performing Technologist: Dondra Prader RVT, RCS  Examination Guidelines: A complete evaluation includes B-mode imaging, spectral Doppler, color Doppler, and power Doppler as needed of all accessible portions of each vessel. Bilateral testing is considered an integral part of a complete examination. Limited examinations for reoccurring indications may be performed as noted.  Right Carotid Findings: +----------+--------+--------+--------+------------------+--------+            PSV cm/sEDV cm/sStenosisPlaque DescriptionComments +----------+--------+--------+--------+------------------+--------+ CCA Prox  86      18                                         +----------+--------+--------+--------+------------------+--------+ CCA Mid   58      10                                         +----------+--------+--------+--------+------------------+--------+  PROGRESS NOTE  [name]  ZOX:[id number] DOB: Sep 20, 1981 DOA: 07/09/2023 PCP: [name], NP   Brief Narrative: Patient is a 42 year old male with history of ESRD on dialysis, insulin-dependent diabetes mellitus, gastroparesis, left AKA, hypertension who was brought here on 10/11 with altered mental status.  He was recently admitted here on July for sepsis secondary to right diabetic foot ulcer, cultures grew MRSA.  Dialysis catheter was removed and replaced.  He was treated with vancomycin, refused echo and left on 7/7 against  medical advice.  On 10/11, he was found to be confused and brought to ED.  When he was brought in, dialysis catheter was partially removed.  He was also found to be in DKA.  He started on insulin drip.  Lab work showed elevated leukocytes, lactate of 2.4.  He was hypotensive.  Admitted for septic shock.  Hospital course remarkable for finding of MRSA in blood cultures.  ID following.  Nephrology following for dialysis.  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  TEE done on 10/16 showed vegetation on multiple valves including big one on mitral valve, cardiothoracic surgery consulted.  Plan for conservative treatment with antibiotics.  MRI of the brain done on 10/17 showed sizable right MCA branch infarct.  Palliative care also consulted  for goals of care  Assessment & Plan:  Principal Problem:   Endocarditis of mitral valve Active Problems:   DKA (diabetic ketoacidosis) ([name])   Altered mental status   MRSA bacteremia   Hemodialysis catheter infection ([name])   Right elbow pain   ESRD on hemodialysis ([name])   Acute systolic CHF (congestive heart failure) ([name])   DCM (dilated cardiomyopathy) ([name])   Coronary artery calcification seen on CAT scan   Infective endocarditis of tricuspid valve  Septic shock due to MRSA bacteremia: Was admitted on July and was found to have MRSA bacteremia and left AMA.  Presented with septic shock with  hypotension, leukocytosis, encephalopathy.  Blood cultures again showed MRSA.  Currently on vancomycin.  Suspected HD line infection.ID following. Afebrile this morning  Infective endocarditis: TEE done on 10/16 showed  vegetations on multiple valves but mainly in the mitral valve.  Cardiothoracic surgery consulted, recommending conservative management with antibiotics for 6 weeks, repeat TEE in 1 to 2 weeks. He may need to finish the antibiotics in the hospital  Acute ischemic stroke/septic infarct:: Patient noticed to have weakness on the left upper extremity.  MRI of the brain done on 10/17 showed sizable right MCA branch infarct.  Likely septic infarct.  Neurology following.  MRI of the brain showed poor signal on the right M2 branch ,could not rule out stenosis.  Flaccid weakness in the left upper extremity persists.  Neurology signed off, recommend outpatient follow-up.  No antithrombotics in the setting of infective endocarditis  Combined systolic/diastolic CHF: Echo showed EF of 25 to 30%, severe decrease left ventricular function, global hypokinesis, grade 1 diastolic dysfunction.  2D echo showed filamentous structure on the ventricular side of the mitral valve, thickened aortic valve leaflets/annulus .  His last echo on 03/2019 showed normal ejection fraction. TEE showed multiple vegetations. He remains euvolemic today.  He needs to follow-up with advanced heart failure team as an outpatient.  DKA: A1c of 8.9.  Initially started on insulin drip, now transition to long-acting and sliding scale.  Monitor blood sugars.  Diabetic coordinator was following.    Acute encephalopathy/left upper extremity weakness: Likely secondary to stroke, sepsis, DKA and hospital-acquired delirium.Marland Kitchen  Avoid sedation.  Collection Time: 07/11/23  8:44 AM   Specimen: BLOOD RIGHT ARM  Result Value Ref Range Status   Specimen Description BLOOD RIGHT ARM  Final   Special Requests   Final    BOTTLES DRAWN AEROBIC ONLY Blood Culture adequate volume   Culture   Final    NO GROWTH 5 DAYS Performed at [facility], [address], [place], [name] [address]    Report Status 07/16/2023 FINAL  Final  Culture, blood (Routine X 2) w Reflex to ID Panel     Status: None (Preliminary result)   Collection Time: 07/15/23  3:00 PM   Specimen: BLOOD RIGHT ARM  Result Value Ref Range Status   Specimen Description BLOOD RIGHT ARM  Final   Special Requests   Final    BOTTLES DRAWN AEROBIC AND ANAEROBIC Blood Culture adequate  volume   Culture   Final    NO GROWTH 3 DAYS Performed at [facility], [address], [place], [name] [address]    Report Status PENDING  Incomplete  Culture, blood (Routine X 2) w Reflex to ID Panel     Status: None (Preliminary result)   Collection Time: 07/15/23  3:02 PM   Specimen: BLOOD RIGHT ARM  Result Value Ref Range Status   Specimen Description BLOOD RIGHT ARM  Final   Special Requests   Final    BOTTLES DRAWN AEROBIC AND ANAEROBIC Blood Culture adequate volume   Culture   Final    NO GROWTH 3 DAYS Performed at [facility], [address], [place], [name] [address]    Report Status PENDING  Incomplete     Radiology Studies: VAS US CAROTID  Result Date: 07/16/2023 Carotid Arterial Duplex Study Patient Name:  [name]  Date of Exam:   07/16/2023 Medical Rec #: [id number]      Accession #:    [id number] Date of Birth: April 07, 1981     Patient Gender: M Patient Age:   42 years Exam Location:  [facility] Procedure:      VAS US CAROTID Referring Phys: Teri Diltz --------------------------------------------------------------------------------  Indications:   CVA. Risk Factors:  Hypertension, Diabetes, no history of smoking, prior CVA. 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 Limitations    Today's exam was limited due to the patient's inability or                unwillingness to cooperate, the patient's respiratory variation                and Cath in IJ, unable to visualize right subclavian. Performing Technologist: Dondra Prader RVT, RCS  Examination Guidelines: A complete evaluation includes B-mode imaging, spectral Doppler, color Doppler, and power Doppler as needed of all accessible portions of each vessel. Bilateral testing is considered an integral part of a complete examination. Limited examinations for reoccurring indications may be performed as noted.  Right Carotid Findings: +----------+--------+--------+--------+------------------+--------+            PSV cm/sEDV cm/sStenosisPlaque DescriptionComments +----------+--------+--------+--------+------------------+--------+ CCA Prox  86      18                                         +----------+--------+--------+--------+------------------+--------+ CCA Mid   58      10                                         +----------+--------+--------+--------+------------------+--------+  Collection Time: 07/11/23  8:44 AM   Specimen: BLOOD RIGHT ARM  Result Value Ref Range Status   Specimen Description BLOOD RIGHT ARM  Final   Special Requests   Final    BOTTLES DRAWN AEROBIC ONLY Blood Culture adequate volume   Culture   Final    NO GROWTH 5 DAYS Performed at [facility], [address], [place], [name] [address]    Report Status 07/16/2023 FINAL  Final  Culture, blood (Routine X 2) w Reflex to ID Panel     Status: None (Preliminary result)   Collection Time: 07/15/23  3:00 PM   Specimen: BLOOD RIGHT ARM  Result Value Ref Range Status   Specimen Description BLOOD RIGHT ARM  Final   Special Requests   Final    BOTTLES DRAWN AEROBIC AND ANAEROBIC Blood Culture adequate  volume   Culture   Final    NO GROWTH 3 DAYS Performed at [facility], [address], [place], [name] [address]    Report Status PENDING  Incomplete  Culture, blood (Routine X 2) w Reflex to ID Panel     Status: None (Preliminary result)   Collection Time: 07/15/23  3:02 PM   Specimen: BLOOD RIGHT ARM  Result Value Ref Range Status   Specimen Description BLOOD RIGHT ARM  Final   Special Requests   Final    BOTTLES DRAWN AEROBIC AND ANAEROBIC Blood Culture adequate volume   Culture   Final    NO GROWTH 3 DAYS Performed at [facility], [address], [place], [name] [address]    Report Status PENDING  Incomplete     Radiology Studies: VAS US CAROTID  Result Date: 07/16/2023 Carotid Arterial Duplex Study Patient Name:  [name]  Date of Exam:   07/16/2023 Medical Rec #: [id number]      Accession #:    [id number] Date of Birth: April 07, 1981     Patient Gender: M Patient Age:   42 years Exam Location:  [facility] Procedure:      VAS US CAROTID Referring Phys: Teri Diltz --------------------------------------------------------------------------------  Indications:   CVA. Risk Factors:  Hypertension, Diabetes, no history of smoking, prior CVA. 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 Limitations    Today's exam was limited due to the patient's inability or                unwillingness to cooperate, the patient's respiratory variation                and Cath in IJ, unable to visualize right subclavian. Performing Technologist: Dondra Prader RVT, RCS  Examination Guidelines: A complete evaluation includes B-mode imaging, spectral Doppler, color Doppler, and power Doppler as needed of all accessible portions of each vessel. Bilateral testing is considered an integral part of a complete examination. Limited examinations for reoccurring indications may be performed as noted.  Right Carotid Findings: +----------+--------+--------+--------+------------------+--------+            PSV cm/sEDV cm/sStenosisPlaque DescriptionComments +----------+--------+--------+--------+------------------+--------+ CCA Prox  86      18                                         +----------+--------+--------+--------+------------------+--------+ CCA Mid   58      10                                         +----------+--------+--------+--------+------------------+--------+  Collection Time: 07/11/23  8:44 AM   Specimen: BLOOD RIGHT ARM  Result Value Ref Range Status   Specimen Description BLOOD RIGHT ARM  Final   Special Requests   Final    BOTTLES DRAWN AEROBIC ONLY Blood Culture adequate volume   Culture   Final    NO GROWTH 5 DAYS Performed at [facility], [address], [place], [name] [address]    Report Status 07/16/2023 FINAL  Final  Culture, blood (Routine X 2) w Reflex to ID Panel     Status: None (Preliminary result)   Collection Time: 07/15/23  3:00 PM   Specimen: BLOOD RIGHT ARM  Result Value Ref Range Status   Specimen Description BLOOD RIGHT ARM  Final   Special Requests   Final    BOTTLES DRAWN AEROBIC AND ANAEROBIC Blood Culture adequate  volume   Culture   Final    NO GROWTH 3 DAYS Performed at [facility], [address], [place], [name] [address]    Report Status PENDING  Incomplete  Culture, blood (Routine X 2) w Reflex to ID Panel     Status: None (Preliminary result)   Collection Time: 07/15/23  3:02 PM   Specimen: BLOOD RIGHT ARM  Result Value Ref Range Status   Specimen Description BLOOD RIGHT ARM  Final   Special Requests   Final    BOTTLES DRAWN AEROBIC AND ANAEROBIC Blood Culture adequate volume   Culture   Final    NO GROWTH 3 DAYS Performed at [facility], [address], [place], [name] [address]    Report Status PENDING  Incomplete     Radiology Studies: VAS US CAROTID  Result Date: 07/16/2023 Carotid Arterial Duplex Study Patient Name:  [name]  Date of Exam:   07/16/2023 Medical Rec #: [id number]      Accession #:    [id number] Date of Birth: April 07, 1981     Patient Gender: M Patient Age:   42 years Exam Location:  [facility] Procedure:      VAS US CAROTID Referring Phys: Teri Diltz --------------------------------------------------------------------------------  Indications:   CVA. Risk Factors:  Hypertension, Diabetes, no history of smoking, prior CVA. 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 Limitations    Today's exam was limited due to the patient's inability or                unwillingness to cooperate, the patient's respiratory variation                and Cath in IJ, unable to visualize right subclavian. Performing Technologist: Dondra Prader RVT, RCS  Examination Guidelines: A complete evaluation includes B-mode imaging, spectral Doppler, color Doppler, and power Doppler as needed of all accessible portions of each vessel. Bilateral testing is considered an integral part of a complete examination. Limited examinations for reoccurring indications may be performed as noted.  Right Carotid Findings: +----------+--------+--------+--------+------------------+--------+            PSV cm/sEDV cm/sStenosisPlaque DescriptionComments +----------+--------+--------+--------+------------------+--------+ CCA Prox  86      18                                         +----------+--------+--------+--------+------------------+--------+ CCA Mid   58      10                                         +----------+--------+--------+--------+------------------+--------+  Collection Time: 07/11/23  8:44 AM   Specimen: BLOOD RIGHT ARM  Result Value Ref Range Status   Specimen Description BLOOD RIGHT ARM  Final   Special Requests   Final    BOTTLES DRAWN AEROBIC ONLY Blood Culture adequate volume   Culture   Final    NO GROWTH 5 DAYS Performed at [facility], [address], [place], [name] [address]    Report Status 07/16/2023 FINAL  Final  Culture, blood (Routine X 2) w Reflex to ID Panel     Status: None (Preliminary result)   Collection Time: 07/15/23  3:00 PM   Specimen: BLOOD RIGHT ARM  Result Value Ref Range Status   Specimen Description BLOOD RIGHT ARM  Final   Special Requests   Final    BOTTLES DRAWN AEROBIC AND ANAEROBIC Blood Culture adequate  volume   Culture   Final    NO GROWTH 3 DAYS Performed at [facility], [address], [place], [name] [address]    Report Status PENDING  Incomplete  Culture, blood (Routine X 2) w Reflex to ID Panel     Status: None (Preliminary result)   Collection Time: 07/15/23  3:02 PM   Specimen: BLOOD RIGHT ARM  Result Value Ref Range Status   Specimen Description BLOOD RIGHT ARM  Final   Special Requests   Final    BOTTLES DRAWN AEROBIC AND ANAEROBIC Blood Culture adequate volume   Culture   Final    NO GROWTH 3 DAYS Performed at [facility], [address], [place], [name] [address]    Report Status PENDING  Incomplete     Radiology Studies: VAS US CAROTID  Result Date: 07/16/2023 Carotid Arterial Duplex Study Patient Name:  [name]  Date of Exam:   07/16/2023 Medical Rec #: [id number]      Accession #:    [id number] Date of Birth: April 07, 1981     Patient Gender: M Patient Age:   42 years Exam Location:  [facility] Procedure:      VAS US CAROTID Referring Phys: Teri Diltz --------------------------------------------------------------------------------  Indications:   CVA. Risk Factors:  Hypertension, Diabetes, no history of smoking, prior CVA. 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 Limitations    Today's exam was limited due to the patient's inability or                unwillingness to cooperate, the patient's respiratory variation                and Cath in IJ, unable to visualize right subclavian. Performing Technologist: Dondra Prader RVT, RCS  Examination Guidelines: A complete evaluation includes B-mode imaging, spectral Doppler, color Doppler, and power Doppler as needed of all accessible portions of each vessel. Bilateral testing is considered an integral part of a complete examination. Limited examinations for reoccurring indications may be performed as noted.  Right Carotid Findings: +----------+--------+--------+--------+------------------+--------+            PSV cm/sEDV cm/sStenosisPlaque DescriptionComments +----------+--------+--------+--------+------------------+--------+ CCA Prox  86      18                                         +----------+--------+--------+--------+------------------+--------+ CCA Mid   58      10                                         +----------+--------+--------+--------+------------------+--------+  PROGRESS NOTE  [name]  ZOX:[id number] DOB: Sep 20, 1981 DOA: 07/09/2023 PCP: [name], NP   Brief Narrative: Patient is a 42 year old male with history of ESRD on dialysis, insulin-dependent diabetes mellitus, gastroparesis, left AKA, hypertension who was brought here on 10/11 with altered mental status.  He was recently admitted here on July for sepsis secondary to right diabetic foot ulcer, cultures grew MRSA.  Dialysis catheter was removed and replaced.  He was treated with vancomycin, refused echo and left on 7/7 against  medical advice.  On 10/11, he was found to be confused and brought to ED.  When he was brought in, dialysis catheter was partially removed.  He was also found to be in DKA.  He started on insulin drip.  Lab work showed elevated leukocytes, lactate of 2.4.  He was hypotensive.  Admitted for septic shock.  Hospital course remarkable for finding of MRSA in blood cultures.  ID following.  Nephrology following for dialysis.  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  TEE done on 10/16 showed vegetation on multiple valves including big one on mitral valve, cardiothoracic surgery consulted.  Plan for conservative treatment with antibiotics.  MRI of the brain done on 10/17 showed sizable right MCA branch infarct.  Palliative care also consulted  for goals of care  Assessment & Plan:  Principal Problem:   Endocarditis of mitral valve Active Problems:   DKA (diabetic ketoacidosis) ([name])   Altered mental status   MRSA bacteremia   Hemodialysis catheter infection ([name])   Right elbow pain   ESRD on hemodialysis ([name])   Acute systolic CHF (congestive heart failure) ([name])   DCM (dilated cardiomyopathy) ([name])   Coronary artery calcification seen on CAT scan   Infective endocarditis of tricuspid valve  Septic shock due to MRSA bacteremia: Was admitted on July and was found to have MRSA bacteremia and left AMA.  Presented with septic shock with  hypotension, leukocytosis, encephalopathy.  Blood cultures again showed MRSA.  Currently on vancomycin.  Suspected HD line infection.ID following. Afebrile this morning  Infective endocarditis: TEE done on 10/16 showed  vegetations on multiple valves but mainly in the mitral valve.  Cardiothoracic surgery consulted, recommending conservative management with antibiotics for 6 weeks, repeat TEE in 1 to 2 weeks. He may need to finish the antibiotics in the hospital  Acute ischemic stroke/septic infarct:: Patient noticed to have weakness on the left upper extremity.  MRI of the brain done on 10/17 showed sizable right MCA branch infarct.  Likely septic infarct.  Neurology following.  MRI of the brain showed poor signal on the right M2 branch ,could not rule out stenosis.  Flaccid weakness in the left upper extremity persists.  Neurology signed off, recommend outpatient follow-up.  No antithrombotics in the setting of infective endocarditis  Combined systolic/diastolic CHF: Echo showed EF of 25 to 30%, severe decrease left ventricular function, global hypokinesis, grade 1 diastolic dysfunction.  2D echo showed filamentous structure on the ventricular side of the mitral valve, thickened aortic valve leaflets/annulus .  His last echo on 03/2019 showed normal ejection fraction. TEE showed multiple vegetations. He remains euvolemic today.  He needs to follow-up with advanced heart failure team as an outpatient.  DKA: A1c of 8.9.  Initially started on insulin drip, now transition to long-acting and sliding scale.  Monitor blood sugars.  Diabetic coordinator was following.    Acute encephalopathy/left upper extremity weakness: Likely secondary to stroke, sepsis, DKA and hospital-acquired delirium.Marland Kitchen  Avoid sedation.  PROGRESS NOTE  [name]  ZOX:[id number] DOB: Sep 20, 1981 DOA: 07/09/2023 PCP: [name], NP   Brief Narrative: Patient is a 42 year old male with history of ESRD on dialysis, insulin-dependent diabetes mellitus, gastroparesis, left AKA, hypertension who was brought here on 10/11 with altered mental status.  He was recently admitted here on July for sepsis secondary to right diabetic foot ulcer, cultures grew MRSA.  Dialysis catheter was removed and replaced.  He was treated with vancomycin, refused echo and left on 7/7 against  medical advice.  On 10/11, he was found to be confused and brought to ED.  When he was brought in, dialysis catheter was partially removed.  He was also found to be in DKA.  He started on insulin drip.  Lab work showed elevated leukocytes, lactate of 2.4.  He was hypotensive.  Admitted for septic shock.  Hospital course remarkable for finding of MRSA in blood cultures.  ID following.  Nephrology following for dialysis.  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  TEE done on 10/16 showed vegetation on multiple valves including big one on mitral valve, cardiothoracic surgery consulted.  Plan for conservative treatment with antibiotics.  MRI of the brain done on 10/17 showed sizable right MCA branch infarct.  Palliative care also consulted  for goals of care  Assessment & Plan:  Principal Problem:   Endocarditis of mitral valve Active Problems:   DKA (diabetic ketoacidosis) ([name])   Altered mental status   MRSA bacteremia   Hemodialysis catheter infection ([name])   Right elbow pain   ESRD on hemodialysis ([name])   Acute systolic CHF (congestive heart failure) ([name])   DCM (dilated cardiomyopathy) ([name])   Coronary artery calcification seen on CAT scan   Infective endocarditis of tricuspid valve  Septic shock due to MRSA bacteremia: Was admitted on July and was found to have MRSA bacteremia and left AMA.  Presented with septic shock with  hypotension, leukocytosis, encephalopathy.  Blood cultures again showed MRSA.  Currently on vancomycin.  Suspected HD line infection.ID following. Afebrile this morning  Infective endocarditis: TEE done on 10/16 showed  vegetations on multiple valves but mainly in the mitral valve.  Cardiothoracic surgery consulted, recommending conservative management with antibiotics for 6 weeks, repeat TEE in 1 to 2 weeks. He may need to finish the antibiotics in the hospital  Acute ischemic stroke/septic infarct:: Patient noticed to have weakness on the left upper extremity.  MRI of the brain done on 10/17 showed sizable right MCA branch infarct.  Likely septic infarct.  Neurology following.  MRI of the brain showed poor signal on the right M2 branch ,could not rule out stenosis.  Flaccid weakness in the left upper extremity persists.  Neurology signed off, recommend outpatient follow-up.  No antithrombotics in the setting of infective endocarditis  Combined systolic/diastolic CHF: Echo showed EF of 25 to 30%, severe decrease left ventricular function, global hypokinesis, grade 1 diastolic dysfunction.  2D echo showed filamentous structure on the ventricular side of the mitral valve, thickened aortic valve leaflets/annulus .  His last echo on 03/2019 showed normal ejection fraction. TEE showed multiple vegetations. He remains euvolemic today.  He needs to follow-up with advanced heart failure team as an outpatient.  DKA: A1c of 8.9.  Initially started on insulin drip, now transition to long-acting and sliding scale.  Monitor blood sugars.  Diabetic coordinator was following.    Acute encephalopathy/left upper extremity weakness: Likely secondary to stroke, sepsis, DKA and hospital-acquired delirium.Marland Kitchen  Avoid sedation.  PROGRESS NOTE  [name]  ZOX:[id number] DOB: Sep 20, 1981 DOA: 07/09/2023 PCP: [name], NP   Brief Narrative: Patient is a 42 year old male with history of ESRD on dialysis, insulin-dependent diabetes mellitus, gastroparesis, left AKA, hypertension who was brought here on 10/11 with altered mental status.  He was recently admitted here on July for sepsis secondary to right diabetic foot ulcer, cultures grew MRSA.  Dialysis catheter was removed and replaced.  He was treated with vancomycin, refused echo and left on 7/7 against  medical advice.  On 10/11, he was found to be confused and brought to ED.  When he was brought in, dialysis catheter was partially removed.  He was also found to be in DKA.  He started on insulin drip.  Lab work showed elevated leukocytes, lactate of 2.4.  He was hypotensive.  Admitted for septic shock.  Hospital course remarkable for finding of MRSA in blood cultures.  ID following.  Nephrology following for dialysis.  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  TEE done on 10/16 showed vegetation on multiple valves including big one on mitral valve, cardiothoracic surgery consulted.  Plan for conservative treatment with antibiotics.  MRI of the brain done on 10/17 showed sizable right MCA branch infarct.  Palliative care also consulted  for goals of care  Assessment & Plan:  Principal Problem:   Endocarditis of mitral valve Active Problems:   DKA (diabetic ketoacidosis) ([name])   Altered mental status   MRSA bacteremia   Hemodialysis catheter infection ([name])   Right elbow pain   ESRD on hemodialysis ([name])   Acute systolic CHF (congestive heart failure) ([name])   DCM (dilated cardiomyopathy) ([name])   Coronary artery calcification seen on CAT scan   Infective endocarditis of tricuspid valve  Septic shock due to MRSA bacteremia: Was admitted on July and was found to have MRSA bacteremia and left AMA.  Presented with septic shock with  hypotension, leukocytosis, encephalopathy.  Blood cultures again showed MRSA.  Currently on vancomycin.  Suspected HD line infection.ID following. Afebrile this morning  Infective endocarditis: TEE done on 10/16 showed  vegetations on multiple valves but mainly in the mitral valve.  Cardiothoracic surgery consulted, recommending conservative management with antibiotics for 6 weeks, repeat TEE in 1 to 2 weeks. He may need to finish the antibiotics in the hospital  Acute ischemic stroke/septic infarct:: Patient noticed to have weakness on the left upper extremity.  MRI of the brain done on 10/17 showed sizable right MCA branch infarct.  Likely septic infarct.  Neurology following.  MRI of the brain showed poor signal on the right M2 branch ,could not rule out stenosis.  Flaccid weakness in the left upper extremity persists.  Neurology signed off, recommend outpatient follow-up.  No antithrombotics in the setting of infective endocarditis  Combined systolic/diastolic CHF: Echo showed EF of 25 to 30%, severe decrease left ventricular function, global hypokinesis, grade 1 diastolic dysfunction.  2D echo showed filamentous structure on the ventricular side of the mitral valve, thickened aortic valve leaflets/annulus .  His last echo on 03/2019 showed normal ejection fraction. TEE showed multiple vegetations. He remains euvolemic today.  He needs to follow-up with advanced heart failure team as an outpatient.  DKA: A1c of 8.9.  Initially started on insulin drip, now transition to long-acting and sliding scale.  Monitor blood sugars.  Diabetic coordinator was following.    Acute encephalopathy/left upper extremity weakness: Likely secondary to stroke, sepsis, DKA and hospital-acquired delirium.Marland Kitchen  Avoid sedation.  PROGRESS NOTE  [name]  ZOX:[id number] DOB: Sep 20, 1981 DOA: 07/09/2023 PCP: [name], NP   Brief Narrative: Patient is a 42 year old male with history of ESRD on dialysis, insulin-dependent diabetes mellitus, gastroparesis, left AKA, hypertension who was brought here on 10/11 with altered mental status.  He was recently admitted here on July for sepsis secondary to right diabetic foot ulcer, cultures grew MRSA.  Dialysis catheter was removed and replaced.  He was treated with vancomycin, refused echo and left on 7/7 against  medical advice.  On 10/11, he was found to be confused and brought to ED.  When he was brought in, dialysis catheter was partially removed.  He was also found to be in DKA.  He started on insulin drip.  Lab work showed elevated leukocytes, lactate of 2.4.  He was hypotensive.  Admitted for septic shock.  Hospital course remarkable for finding of MRSA in blood cultures.  ID following.  Nephrology following for dialysis.  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  TEE done on 10/16 showed vegetation on multiple valves including big one on mitral valve, cardiothoracic surgery consulted.  Plan for conservative treatment with antibiotics.  MRI of the brain done on 10/17 showed sizable right MCA branch infarct.  Palliative care also consulted  for goals of care  Assessment & Plan:  Principal Problem:   Endocarditis of mitral valve Active Problems:   DKA (diabetic ketoacidosis) ([name])   Altered mental status   MRSA bacteremia   Hemodialysis catheter infection ([name])   Right elbow pain   ESRD on hemodialysis ([name])   Acute systolic CHF (congestive heart failure) ([name])   DCM (dilated cardiomyopathy) ([name])   Coronary artery calcification seen on CAT scan   Infective endocarditis of tricuspid valve  Septic shock due to MRSA bacteremia: Was admitted on July and was found to have MRSA bacteremia and left AMA.  Presented with septic shock with  hypotension, leukocytosis, encephalopathy.  Blood cultures again showed MRSA.  Currently on vancomycin.  Suspected HD line infection.ID following. Afebrile this morning  Infective endocarditis: TEE done on 10/16 showed  vegetations on multiple valves but mainly in the mitral valve.  Cardiothoracic surgery consulted, recommending conservative management with antibiotics for 6 weeks, repeat TEE in 1 to 2 weeks. He may need to finish the antibiotics in the hospital  Acute ischemic stroke/septic infarct:: Patient noticed to have weakness on the left upper extremity.  MRI of the brain done on 10/17 showed sizable right MCA branch infarct.  Likely septic infarct.  Neurology following.  MRI of the brain showed poor signal on the right M2 branch ,could not rule out stenosis.  Flaccid weakness in the left upper extremity persists.  Neurology signed off, recommend outpatient follow-up.  No antithrombotics in the setting of infective endocarditis  Combined systolic/diastolic CHF: Echo showed EF of 25 to 30%, severe decrease left ventricular function, global hypokinesis, grade 1 diastolic dysfunction.  2D echo showed filamentous structure on the ventricular side of the mitral valve, thickened aortic valve leaflets/annulus .  His last echo on 03/2019 showed normal ejection fraction. TEE showed multiple vegetations. He remains euvolemic today.  He needs to follow-up with advanced heart failure team as an outpatient.  DKA: A1c of 8.9.  Initially started on insulin drip, now transition to long-acting and sliding scale.  Monitor blood sugars.  Diabetic coordinator was following.    Acute encephalopathy/left upper extremity weakness: Likely secondary to stroke, sepsis, DKA and hospital-acquired delirium.Marland Kitchen  Avoid sedation.

## 2023-07-19 ENCOUNTER — Inpatient Hospital Stay (HOSPITAL_COMMUNITY): Payer: Medicare HMO

## 2023-07-19 DIAGNOSIS — E1010 Type 1 diabetes mellitus with ketoacidosis without coma: Secondary | ICD-10-CM | POA: Diagnosis not present

## 2023-07-19 DIAGNOSIS — T827XXD Infection and inflammatory reaction due to other cardiac and vascular devices, implants and grafts, subsequent encounter: Secondary | ICD-10-CM | POA: Diagnosis not present

## 2023-07-19 DIAGNOSIS — J9601 Acute respiratory failure with hypoxia: Secondary | ICD-10-CM | POA: Diagnosis not present

## 2023-07-19 DIAGNOSIS — I059 Rheumatic mitral valve disease, unspecified: Secondary | ICD-10-CM

## 2023-07-19 DIAGNOSIS — N186 End stage renal disease: Secondary | ICD-10-CM | POA: Diagnosis not present

## 2023-07-19 LAB — COMPREHENSIVE METABOLIC PANEL
ALT: 7 U/L (ref 0–44)
AST: 22 U/L (ref 15–41)
Albumin: 2.2 g/dL — ABNORMAL LOW (ref 3.5–5.0)
Alkaline Phosphatase: 169 U/L — ABNORMAL HIGH (ref 38–126)
Anion gap: 17 — ABNORMAL HIGH (ref 5–15)
BUN: 41 mg/dL — ABNORMAL HIGH (ref 6–20)
CO2: 21 mmol/L — ABNORMAL LOW (ref 22–32)
Calcium: 7.5 mg/dL — ABNORMAL LOW (ref 8.9–10.3)
Chloride: 94 mmol/L — ABNORMAL LOW (ref 98–111)
Creatinine, Ser: 10 mg/dL — ABNORMAL HIGH (ref 0.61–1.24)
GFR, Estimated: 6 mL/min — ABNORMAL LOW (ref >60)
Glucose, Bld: 160 mg/dL — ABNORMAL HIGH (ref 70–99)
Potassium: 4.3 mmol/L (ref 3.5–5.1)
Sodium: 132 mmol/L — ABNORMAL LOW (ref 135–145)
Total Bilirubin: 1.3 mg/dL — ABNORMAL HIGH (ref 0.3–1.2)
Total Protein: 7.4 g/dL (ref 6.5–8.1)

## 2023-07-19 LAB — GLUCOSE, CAPILLARY
Glucose-Capillary: 100 mg/dL — ABNORMAL HIGH (ref 70–99)
Glucose-Capillary: 129 mg/dL — ABNORMAL HIGH (ref 70–99)
Glucose-Capillary: 139 mg/dL — ABNORMAL HIGH (ref 70–99)
Glucose-Capillary: 167 mg/dL — ABNORMAL HIGH (ref 70–99)
Glucose-Capillary: 203 mg/dL — ABNORMAL HIGH (ref 70–99)
Glucose-Capillary: 69 mg/dL — ABNORMAL LOW (ref 70–99)
Glucose-Capillary: 76 mg/dL (ref 70–99)
Glucose-Capillary: 89 mg/dL (ref 70–99)
Glucose-Capillary: 91 mg/dL (ref 70–99)

## 2023-07-19 LAB — LACTIC ACID, PLASMA
Lactic Acid, Venous: 2.5 mmol/L (ref 0.5–1.9)
Lactic Acid, Venous: 2.7 mmol/L (ref 0.5–1.9)

## 2023-07-19 LAB — POCT I-STAT 7, (LYTES, BLD GAS, ICA,H+H)
Acid-base deficit: 3 mmol/L — ABNORMAL HIGH (ref 0.0–2.0)
Bicarbonate: 23 mmol/L (ref 20.0–28.0)
Calcium, Ion: 1.05 mmol/L — ABNORMAL LOW (ref 1.15–1.40)
HCT: 34 % — ABNORMAL LOW (ref 39.0–52.0)
Hemoglobin: 11.6 g/dL — ABNORMAL LOW (ref 13.0–17.0)
O2 Saturation: 97 %
Patient temperature: 99
Potassium: 3.6 mmol/L (ref 3.5–5.1)
Sodium: 131 mmol/L — ABNORMAL LOW (ref 135–145)
TCO2: 24 mmol/L (ref 22–32)
pCO2 arterial: 44 mm[Hg] (ref 32–48)
pH, Arterial: 7.327 — ABNORMAL LOW (ref 7.35–7.45)
pO2, Arterial: 101 mm[Hg] (ref 83–108)

## 2023-07-19 LAB — CBC
HCT: 33.2 % — ABNORMAL LOW (ref 39.0–52.0)
Hemoglobin: 10.2 g/dL — ABNORMAL LOW (ref 13.0–17.0)
MCH: 27.3 pg (ref 26.0–34.0)
MCHC: 30.7 g/dL (ref 30.0–36.0)
MCV: 89 fL (ref 80.0–100.0)
Platelets: 179 10*3/uL (ref 150–400)
RBC: 3.73 MIL/uL — ABNORMAL LOW (ref 4.22–5.81)
RDW: 15.4 % (ref 11.5–15.5)
WBC: 73.5 10*3/uL (ref 4.0–10.5)
nRBC: 0 % (ref 0.0–0.2)

## 2023-07-19 LAB — BLOOD GAS, ARTERIAL
Acid-base deficit: 3.4 mmol/L — ABNORMAL HIGH (ref 0.0–2.0)
Bicarbonate: 23.6 mmol/L (ref 20.0–28.0)
Drawn by: 51155
O2 Saturation: 84.8 %
Patient temperature: 37.1
pCO2 arterial: 49 mm[Hg] — ABNORMAL HIGH (ref 32–48)
pH, Arterial: 7.29 — ABNORMAL LOW (ref 7.35–7.45)
pO2, Arterial: 54 mm[Hg] — ABNORMAL LOW (ref 83–108)

## 2023-07-19 LAB — BASIC METABOLIC PANEL
Anion gap: 20 — ABNORMAL HIGH (ref 5–15)
BUN: 39 mg/dL — ABNORMAL HIGH (ref 6–20)
CO2: 22 mmol/L (ref 22–32)
Calcium: 8.6 mg/dL — ABNORMAL LOW (ref 8.9–10.3)
Chloride: 92 mmol/L — ABNORMAL LOW (ref 98–111)
Creatinine, Ser: 9.46 mg/dL — ABNORMAL HIGH (ref 0.61–1.24)
GFR, Estimated: 6 mL/min — ABNORMAL LOW (ref >60)
Glucose, Bld: 106 mg/dL — ABNORMAL HIGH (ref 70–99)
Potassium: 4.1 mmol/L (ref 3.5–5.1)
Sodium: 134 mmol/L — ABNORMAL LOW (ref 135–145)

## 2023-07-19 LAB — MAGNESIUM: Magnesium: 2.7 mg/dL — ABNORMAL HIGH (ref 1.7–2.4)

## 2023-07-19 LAB — PHOSPHORUS: Phosphorus: 4.7 mg/dL — ABNORMAL HIGH (ref 2.5–4.6)

## 2023-07-19 LAB — HEPATITIS B SURFACE ANTIGEN: Hepatitis B Surface Ag: NONREACTIVE

## 2023-07-19 MED ORDER — NOREPINEPHRINE 4 MG/250ML-% IV SOLN
0.0000 ug/min | INTRAVENOUS | Status: DC
  Administered 2023-07-19: 13 ug/min via INTRAVENOUS
  Administered 2023-07-19: 15 ug/min via INTRAVENOUS
  Administered 2023-07-19: 9 ug/min via INTRAVENOUS
  Administered 2023-07-20: 8 ug/min via INTRAVENOUS
  Administered 2023-07-20: 7 ug/min via INTRAVENOUS
  Administered 2023-07-20: 8 ug/min via INTRAVENOUS
  Administered 2023-07-20: 9 ug/min via INTRAVENOUS
  Administered 2023-07-21: 6 ug/min via INTRAVENOUS
  Administered 2023-07-21: 4 ug/min via INTRAVENOUS
  Administered 2023-07-22: 10 ug/min via INTRAVENOUS
  Administered 2023-07-22: 6 ug/min via INTRAVENOUS
  Administered 2023-07-23: 3 ug/min via INTRAVENOUS
  Filled 2023-07-19 (×12): qty 250

## 2023-07-19 MED ORDER — FENTANYL BOLUS VIA INFUSION
50.0000 ug | INTRAVENOUS | Status: DC | PRN
Start: 2023-07-19 — End: 2023-07-22
  Administered 2023-07-20: 50 ug via INTRAVENOUS
  Administered 2023-07-20 (×2): 100 ug via INTRAVENOUS
  Administered 2023-07-20: 50 ug via INTRAVENOUS
  Administered 2023-07-21 (×6): 100 ug via INTRAVENOUS
  Administered 2023-07-21: 50 ug via INTRAVENOUS
  Administered 2023-07-21 (×2): 100 ug via INTRAVENOUS
  Administered 2023-07-21 – 2023-07-22 (×2): 50 ug via INTRAVENOUS
  Administered 2023-07-22 (×2): 100 ug via INTRAVENOUS
  Administered 2023-07-22: 50 ug via INTRAVENOUS
  Administered 2023-07-22: 100 ug via INTRAVENOUS
  Administered 2023-07-22 (×2): 50 ug via INTRAVENOUS

## 2023-07-19 MED ORDER — FENTANYL CITRATE PF 50 MCG/ML IJ SOSY
50.0000 ug | PREFILLED_SYRINGE | INTRAMUSCULAR | Status: DC | PRN
Start: 2023-07-19 — End: 2023-07-19

## 2023-07-19 MED ORDER — POLYETHYLENE GLYCOL 3350 17 G PO PACK
17.0000 g | PACK | Freq: Every day | ORAL | Status: DC | PRN
  Administered 2023-07-24: 17 g
  Filled 2023-07-19: qty 1

## 2023-07-19 MED ORDER — VANCOMYCIN HCL 750 MG/150ML IV SOLN: 750.0000 mg | INTRAVENOUS | Status: DC

## 2023-07-19 MED ORDER — NOREPINEPHRINE 4 MG/250ML-% IV SOLN: 2.0000 ug/min | INTRAVENOUS | Status: DC

## 2023-07-19 MED ORDER — MIDAZOLAM HCL 2 MG/2ML IJ SOLN: 2.0000 mg | Freq: Once | INTRAMUSCULAR | Status: AC

## 2023-07-19 MED ORDER — SODIUM CHLORIDE 0.9 % IV SOLN
250.0000 mL | INTRAVENOUS | Status: AC
  Administered 2023-07-19: 250 mL via INTRAVENOUS

## 2023-07-19 MED ORDER — CALCITRIOL 1 MCG/ML PO SOLN
1.5000 ug | ORAL | Status: DC
  Administered 2023-07-19 – 2023-08-13 (×11): 1.5 ug
  Filled 2023-07-19 (×12): qty 1.5

## 2023-07-19 MED ORDER — DOCUSATE SODIUM 50 MG/5ML PO LIQD
100.0000 mg | Freq: Two times a day (BID) | ORAL | Status: DC
  Administered 2023-07-19 – 2023-07-20 (×4): 100 mg
  Filled 2023-07-19 (×4): qty 10

## 2023-07-19 MED ORDER — SODIUM CHLORIDE 0.9 % IV SOLN
1.0000 g | INTRAVENOUS | Status: DC
  Administered 2023-07-19: 1 g via INTRAVENOUS
  Filled 2023-07-19 (×2): qty 10

## 2023-07-19 MED ORDER — ETOMIDATE 2 MG/ML IV SOLN
20.0000 mg | Freq: Once | INTRAVENOUS | Status: AC
  Administered 2023-07-19: 20 mg via INTRAVENOUS

## 2023-07-19 MED ORDER — PROMETHAZINE HCL 12.5 MG PO TABS
12.5000 mg | ORAL_TABLET | Freq: Three times a day (TID) | ORAL | Status: DC | PRN
  Administered 2023-07-19: 12.5 mg
  Filled 2023-07-19 (×2): qty 1

## 2023-07-19 MED ORDER — VITAL 1.5 CAL PO LIQD
1000.0000 mL | ORAL | Status: DC
  Administered 2023-07-19 – 2023-07-21 (×3): 1000 mL
  Filled 2023-07-19: qty 1000

## 2023-07-19 MED ORDER — ORAL CARE MOUTH RINSE: 15.0000 mL | OROMUCOSAL | Status: DC | PRN

## 2023-07-19 MED ORDER — DEXTROSE 50 % IV SOLN
INTRAVENOUS | Status: AC
  Filled 2023-07-19: qty 50

## 2023-07-19 MED ORDER — MIDAZOLAM HCL 2 MG/2ML IJ SOLN
2.0000 mg | Freq: Once | INTRAMUSCULAR | Status: DC
  Filled 2023-07-19: qty 2

## 2023-07-19 MED ORDER — FENTANYL CITRATE PF 50 MCG/ML IJ SOSY
100.0000 ug | PREFILLED_SYRINGE | Freq: Once | INTRAMUSCULAR | Status: AC
  Administered 2023-07-19: 100 ug via INTRAVENOUS

## 2023-07-19 MED ORDER — FENTANYL 2500MCG IN NS 250ML (10MCG/ML) PREMIX INFUSION
50.0000 ug/h | INTRAVENOUS | Status: DC
Start: 2023-07-19 — End: 2023-07-22
  Administered 2023-07-19: 50 ug/h via INTRAVENOUS
  Administered 2023-07-20 (×2): 100 ug/h via INTRAVENOUS
  Administered 2023-07-21: 50 ug/h via INTRAVENOUS
  Administered 2023-07-22: 200 ug/h via INTRAVENOUS
  Filled 2023-07-19 (×6): qty 250

## 2023-07-19 MED ORDER — DEXMEDETOMIDINE HCL IN NACL 400 MCG/100ML IV SOLN
INTRAVENOUS | Status: AC
  Administered 2023-07-19: 0.2 ug/kg/h via INTRAVENOUS
  Filled 2023-07-19: qty 100

## 2023-07-19 MED ORDER — PROSOURCE TF20 ENFIT COMPATIBL EN LIQD
60.0000 mL | Freq: Two times a day (BID) | ENTERAL | Status: DC
  Administered 2023-07-19 – 2023-07-21 (×4): 60 mL
  Filled 2023-07-19 (×4): qty 60

## 2023-07-19 MED ORDER — ORAL CARE MOUTH RINSE
15.0000 mL | OROMUCOSAL | Status: DC
Start: 2023-07-19 — End: 2023-07-24
  Administered 2023-07-19 – 2023-07-24 (×70): 15 mL via OROMUCOSAL

## 2023-07-19 MED ORDER — CALCIUM GLUCONATE-NACL 1-0.675 GM/50ML-% IV SOLN
1.0000 g | Freq: Once | INTRAVENOUS | Status: AC
  Administered 2023-07-19: 1000 mg via INTRAVENOUS
  Filled 2023-07-19: qty 50

## 2023-07-19 MED ORDER — SEVELAMER CARBONATE 800 MG PO TABS
2400.0000 mg | ORAL_TABLET | Freq: Three times a day (TID) | ORAL | Status: DC
  Administered 2023-07-19 – 2023-07-21 (×8): 2400 mg
  Filled 2023-07-19 (×9): qty 3

## 2023-07-19 MED ORDER — OXYCODONE HCL 5 MG PO TABS: 5.0000 mg | ORAL_TABLET | Freq: Four times a day (QID) | ORAL | Status: DC | PRN

## 2023-07-19 MED ORDER — ACETAMINOPHEN 325 MG PO TABS
650.0000 mg | ORAL_TABLET | Freq: Four times a day (QID) | ORAL | Status: DC | PRN
  Administered 2023-07-19 – 2023-08-04 (×17): 650 mg
  Filled 2023-07-19 (×19): qty 2

## 2023-07-19 MED ORDER — FENTANYL CITRATE PF 50 MCG/ML IJ SOSY: 50.0000 ug | PREFILLED_SYRINGE | INTRAMUSCULAR | Status: DC | PRN

## 2023-07-19 MED ORDER — ADULT MULTIVITAMIN W/MINERALS CH
1.0000 | ORAL_TABLET | Freq: Every day | ORAL | Status: DC
  Administered 2023-07-19 – 2023-07-20 (×2): 1
  Filled 2023-07-19 (×2): qty 1

## 2023-07-19 MED ORDER — MIDAZOLAM HCL 2 MG/2ML IJ SOLN
INTRAMUSCULAR | Status: AC
  Administered 2023-07-19: 2 mg via INTRAVENOUS
  Filled 2023-07-19: qty 2

## 2023-07-19 MED ORDER — NOREPINEPHRINE 4 MG/250ML-% IV SOLN
INTRAVENOUS | Status: AC
  Administered 2023-07-19: 2 ug/min via INTRAVENOUS
  Filled 2023-07-19: qty 250

## 2023-07-19 MED ORDER — SODIUM CHLORIDE 0.9 % IV SOLN: 2.0000 g | INTRAVENOUS | Status: DC

## 2023-07-19 MED ORDER — INSULIN ASPART 100 UNIT/ML IJ SOLN
0.0000 [IU] | INTRAMUSCULAR | Status: DC
  Administered 2023-07-19: 2 [IU] via SUBCUTANEOUS
  Administered 2023-07-19: 1 [IU] via SUBCUTANEOUS
  Administered 2023-07-20: 3 [IU] via SUBCUTANEOUS
  Administered 2023-07-20: 1 [IU] via SUBCUTANEOUS
  Administered 2023-07-20: 3 [IU] via SUBCUTANEOUS
  Administered 2023-07-20: 1 [IU] via SUBCUTANEOUS
  Administered 2023-07-20: 2 [IU] via SUBCUTANEOUS
  Administered 2023-07-21: 1 [IU] via SUBCUTANEOUS
  Administered 2023-07-21 (×2): 3 [IU] via SUBCUTANEOUS

## 2023-07-19 MED ORDER — GABAPENTIN 250 MG/5ML PO SOLN
100.0000 mg | Freq: Three times a day (TID) | ORAL | Status: AC
  Administered 2023-07-19 – 2023-07-30 (×32): 100 mg
  Filled 2023-07-19 (×36): qty 2

## 2023-07-19 MED ORDER — SODIUM CHLORIDE 0.9 % IV SOLN: 250.0000 mL | INTRAVENOUS | Status: DC

## 2023-07-19 MED ORDER — DOCUSATE SODIUM 50 MG/5ML PO LIQD
100.0000 mg | Freq: Two times a day (BID) | ORAL | Status: DC | PRN
  Administered 2023-07-24: 100 mg via ORAL
  Filled 2023-07-19: qty 10

## 2023-07-19 MED ORDER — POLYETHYLENE GLYCOL 3350 17 G PO PACK
17.0000 g | PACK | Freq: Every day | ORAL | Status: DC
Start: 2023-07-19 — End: 2023-07-21
  Administered 2023-07-20: 17 g
  Filled 2023-07-19: qty 1

## 2023-07-19 MED ORDER — DEXMEDETOMIDINE HCL IN NACL 400 MCG/100ML IV SOLN
0.0000 ug/kg/h | INTRAVENOUS | Status: DC
  Administered 2023-07-19: 0.4 ug/kg/h via INTRAVENOUS
  Administered 2023-07-20: 0.5 ug/kg/h via INTRAVENOUS
  Administered 2023-07-20: 0.4 ug/kg/h via INTRAVENOUS
  Administered 2023-07-20: 0.6 ug/kg/h via INTRAVENOUS
  Administered 2023-07-21: 0.9 ug/kg/h via INTRAVENOUS
  Administered 2023-07-21: 0.5 ug/kg/h via INTRAVENOUS
  Administered 2023-07-22 (×4): 1.2 ug/kg/h via INTRAVENOUS
  Administered 2023-07-22: 0.9 ug/kg/h via INTRAVENOUS
  Administered 2023-07-23: 0.4 ug/kg/h via INTRAVENOUS
  Administered 2023-07-23 – 2023-07-24 (×3): 1.2 ug/kg/h via INTRAVENOUS
  Administered 2023-07-24: 1 ug/kg/h via INTRAVENOUS
  Administered 2023-07-24: 0.6 ug/kg/h via INTRAVENOUS
  Administered 2023-07-24 – 2023-07-25 (×3): 1 ug/kg/h via INTRAVENOUS
  Administered 2023-07-25 (×3): 1.2 ug/kg/h via INTRAVENOUS
  Administered 2023-07-26: 1 ug/kg/h via INTRAVENOUS
  Filled 2023-07-19 (×26): qty 100

## 2023-07-19 MED ORDER — HEPARIN SODIUM (PORCINE) 5000 UNIT/ML IJ SOLN
5000.0000 [IU] | Freq: Three times a day (TID) | INTRAMUSCULAR | Status: DC
  Administered 2023-07-19 – 2023-07-23 (×13): 5000 [IU] via SUBCUTANEOUS
  Filled 2023-07-19 (×15): qty 1

## 2023-07-19 MED ORDER — NOREPINEPHRINE 4 MG/250ML-% IV SOLN
2.0000 ug/min | INTRAVENOUS | Status: DC
  Administered 2023-07-19: 8 ug/min via INTRAVENOUS

## 2023-07-19 MED ORDER — DEXTROSE 50 % IV SOLN
25.0000 mL | Freq: Once | INTRAVENOUS | Status: AC
  Administered 2023-07-19: 25 mL via INTRAVENOUS
  Filled 2023-07-19: qty 50

## 2023-07-19 MED ORDER — SODIUM CHLORIDE 0.9 % IV SOLN
2.0000 g | Freq: Once | INTRAVENOUS | Status: AC
  Administered 2023-07-19: 2 g via INTRAVENOUS
  Filled 2023-07-19: qty 12.5

## 2023-07-19 MED ORDER — FAMOTIDINE 20 MG PO TABS
10.0000 mg | ORAL_TABLET | Freq: Every day | ORAL | Status: DC
  Administered 2023-07-19 – 2023-07-22 (×4): 10 mg
  Filled 2023-07-19 (×4): qty 1

## 2023-07-19 NOTE — Progress Notes (Signed)
Pharmacy Antibiotic Note  [name] is a 42 y.o. male admitted on 07/09/2023 with sepsis, found down. MRSA bacteremia. ESRD w/ last HD 10/18. Pt decompensated overnight, pharmacy dosing cefepime and vancomycin -tmax= 101.5 -plans for no HD today  Plan: Cefepime 1gm IV q24h Hold vancomycin today and restart 10/23 Will follow HD plans   Height: 5\' 7"  (170.2 cm) Weight: 65.9 kg (145 lb 4.5 oz) IBW/kg (Calculated) : 66.1  Temp (24hrs), Avg:99.4 F (37.4 C), Min:97.8 F (36.6 C), Max:101.5 F (38.6 C)  Recent Labs  Lab 07/13/23 0507 07/14/23 0910 07/15/23 0609 07/15/23 1500 07/16/23 1835 07/19/23 0434 07/19/23 0958  WBC  --  15.3* 13.7*  --  14.9* ACCURACY OF RESULTS QUESTIONABLE. RECOMMEND RECOLLECT TO VERIFY.  --   CREATININE 10.30* 11.70* 8.54*  --  10.04* 9.46*  --   LATICACIDVEN  --   --   --   --   --  2.5* 2.7*  VANCORANDOM  --   --   --  25  --   --   --     Estimated Creatinine Clearance: 9.4 mL/min (A) (by C-G formula based on SCr of 9.46 mg/dL (H)).    No Known Allergies  Microbiology results: 10/112 covid negative 10/12 BCx: MRSA 10/12 MRSA PCR: positive 10/12 Cdiff PCR positive, toxin negative 10/13 Bcx: ngtdF 10/17 Bcx: ngtd 10/21 trach- pending  Thank you for allowing pharmacy to be a part of this patient's care.  Harland German, PharmD Clinical Pharmacist **Pharmacist phone directory can now be found on [email or web address] ([email or web address]).  Listed under [facility].

## 2023-07-19 NOTE — Progress Notes (Signed)
eLink Physician-Brief Progress Note Patient Name: [name] DOB: 1981-01-01 MRN: [id number]   Date of Service  07/19/2023  HPI/Events of Note  42 yo M w/ pertinent PMH ESRD on HD MWF, IDDM, gastroparesis, left AKA, HTN presents to [facility] on 10/11 w/ AMS.   On examination, the patient has febrile, tachypneic, tachycardic otherwise normal vitals.  Results show mild hypoglycemia, resolving acidosis.  Laboratory studies are pending but appropriate i-STAT.  Chest radiograph with patchy bilateral airspace disease left greater than right.  eICU Interventions  Notable bloody looking output from OG/supraglottic.  Continue observation  Extremely limited access points given AKA and permacath in place.  Maintain peripheral nor epi for now.  Titrate to SBP of 100.  Heparin for DVT prophylaxis Famotidine for GI prophylaxis     Intervention Category Evaluation Type: New Patient Evaluation  [name] 07/19/2023, 1:34 AM

## 2023-07-19 NOTE — Significant Event (Signed)
Rapid Response Event Note  Reason for Call : Hypoxic respiratory failure  Initial Focused Assessment:  I was notified of Mr. Levison change in oxygen saturations and lethargy. Upon arrival, Mr. Septer was no longer verbal, in significant respiratory distress and labored breathing with sats 73% on NRB mask at 15L. Dr. Antionette Char notified and came to bedside. PCCM consulted for transfer to ICU and intubation. New PIV 18g inserted R AC. Abg pending.   2315-98.54F, HR 120 ST, 115/65 (81), RR 28 with sats 73% on NRB 15L.   Pt urgently transferred to 2H21 for intubation.    Interventions:  -ABG stat -PIV -Transfer to ICU    MD Notified: Dr. Antionette Char came to bedside. Dr. Gaynell Face Call Time: 2342 Arrival Time: 2345 End Time: 0100  Rose Fillers, RN

## 2023-07-19 NOTE — Progress Notes (Signed)
Cross covering ICU physician.   Called to bedside for acute hypoxemia and ams. Rhonchi bilaterally suspect aspiration. Will transfer to ICU for intubation at this time.  Cont abx, abg pending. Dialysis per nephro

## 2023-07-19 NOTE — Progress Notes (Signed)
[facility]  Date of Admission:  07/09/2023     Total days of antibiotics 11  Reason for follow up: MRSA bacteremia / mitral valve endocarditis          ASSESSMENT:  [name] blood cultures from 07/11/23 and 07/15/23 remain without growth. Course complicated with development of acute hypoxic respiratory failure requiring intubation. Febrile overnight and suspect this is likely related to burden of infection although pneumonia cannot be excluded. Respiratory culture growing gram positive rods/cocci and would suspect this would be concordant with previous MRSA cultures and was started on Cefepime for potential pneumonia.  Femoral dialysis catheter placed after line holiday and clearance of blood cultures on 07/11/23 so no indication for removal at this time. Appears to be tolerating antibiotics with no adverse side effects. Continue current dose of vancomycin. Currently on Enteric precautions with C. Diff antigen positive and toxin and PCR negative. Spoke with IP and will discontinue Enteric precautions and continue with Contact precautions. Critically ill with disseminated MRSA infection complicated by R MCA infarct. Palliative consult pending for Goals of Care.  Remaining medical care per CCM and Nephrology.    PLAN:  Continue current dose of vancomycin for disseminated MRSA infection Continue Cefepime pending respiratory cultures Discontinue Enteric Precautions with negative C. Diff toxin and PCR.  Continue Contact precautions for disseminated MRSA infection. Monitor cultures for continued clearance of bacteremia.  Monitor fever curves for any additional fever.  Agree with Palliative evaluation for goals of care.  Remaining medical and supportive care per CCM and Nephrology.    I have personally spent 28 minutes involved in face-to-face and non-face-to-face activities for this patient on the day of the visit. Professional time spent includes the following  activities: Preparing to see the patient (review of tests), Obtaining and/or reviewing separately obtained history (admission/discharge record), Performing a medically appropriate examination and/or evaluation , Ordering medications/tests/procedures, referring and communicating with other health care professionals, Documenting clinical information in the EMR, Independently interpreting results (not separately reported), Communicating results to the patient/family/caregiver, Counseling and educating the patient/family/caregiver and Care coordination (not separately reported).   Principal Problem:   Endocarditis of mitral valve Active Problems:   DKA (diabetic ketoacidosis) ([name])   Altered mental status   MRSA bacteremia   Hemodialysis catheter infection ([name])   Right elbow pain   ESRD on hemodialysis ([name])   Acute systolic CHF (congestive heart failure) ([name])   DCM (dilated cardiomyopathy) ([name])   Coronary artery calcification seen on CAT scan   Infective endocarditis of tricuspid valve   Acute respiratory failure with hypoxia ([name])    calcitRIOL  1.5 mcg Per Tube Q M,W,F   Chlorhexidine Gluconate Cloth  6 each Topical Q0600   darbepoetin (ARANESP) injection - DIALYSIS  40 mcg Subcutaneous Q Fri-1800   dextrose       docusate  100 mg Per Tube BID   famotidine  10 mg Per Tube Daily   gabapentin  100 mg Per Tube Q8H   heparin injection (subcutaneous)  5,000 Units Subcutaneous Q8H   heparin sodium (porcine)  4,600 Units Intracatheter Once   insulin aspart  0-6 Units Subcutaneous Q4H   midazolam  2 mg Intravenous Once   multivitamin with minerals  1 tablet Per Tube QHS   mouth rinse  15 mL Mouth Rinse Q2H   polyethylene glycol  17 g Per Tube Daily   sevelamer carbonate  2,400 mg Per Tube TID WC    SUBJECTIVE:  Febrile  overnight with max temperature of 101.5 F. Experienced acute hypoxic respirator failure overnight requiring intubation and now sedated.   No Known  Allergies   Review of Systems: Review of Systems  Unable to perform ROS: Intubated      OBJECTIVE: Vitals:   07/19/23 1045 07/19/23 1100 07/19/23 1130 07/19/23 1140  BP: (!) 116/32 (!) 122/43 (!) 122/27   Pulse:    84  Resp: 20 18 18 18   Temp:      TempSrc:      SpO2:    99%  Weight:      Height:       Body mass index is 22.75 kg/m.  Physical Exam Constitutional:      General: He is not in acute distress.    Appearance: He is well-developed. He is ill-appearing.     Interventions: He is sedated and intubated.  Cardiovascular:     Rate and Rhythm: Normal rate and regular rhythm.     Heart sounds: Normal heart sounds.     Comments: Femoral TDC left thigh.  Pulmonary:     Effort: Pulmonary effort is normal. He is intubated.     Breath sounds: Normal breath sounds.  Skin:    General: Skin is warm and dry.  Neurological:     Mental Status: He is alert and oriented to person, place, and time.  Psychiatric:        Behavior: Behavior normal.        Thought Content: Thought content normal.        Judgment: Judgment normal.     Lab Results Lab Results  Component Value Date   WBC PENDING 07/19/2023   HGB 10.2 (L) 07/19/2023   HCT 33.2 (L) 07/19/2023   MCV 89.0 07/19/2023   PLT 179 07/19/2023    Lab Results  Component Value Date   CREATININE 10.00 (H) 07/19/2023   BUN 41 (H) 07/19/2023   NA 132 (L) 07/19/2023   K 4.3 07/19/2023   CL 94 (L) 07/19/2023   CO2 21 (L) 07/19/2023    Lab Results  Component Value Date   ALT 7 07/19/2023   AST 22 07/19/2023   ALKPHOS 169 (H) 07/19/2023   BILITOT 1.3 (H) 07/19/2023     Microbiology: Recent Results (from the past 240 hour(s))  Culture, blood (routine x 2)     Status: Abnormal   Collection Time: 07/10/23 12:15 AM   Specimen: BLOOD  Result Value Ref Range Status   Specimen Description BLOOD BLOOD RIGHT ARM  Final   Special Requests   Final    BOTTLES DRAWN AEROBIC AND ANAEROBIC Blood Culture results may not be  optimal due to an excessive volume of blood received in culture bottles   Culture  Setup Time   Final    GRAM POSITIVE COCCI IN CLUSTERS IN BOTH AEROBIC AND ANAEROBIC BOTTLES CRITICAL RESULT CALLED TO, READ BACK BY AND VERIFIED WITH: Valarie Merino ZHOU [id number] AT 1234 BY EC Performed at [facility], [address], [place], [name] [address]    Culture METHICILLIN RESISTANT STAPHYLOCOCCUS AUREUS (A)  Final   Report Status 07/12/2023 FINAL  Final   Organism ID, Bacteria METHICILLIN RESISTANT STAPHYLOCOCCUS AUREUS  Final      Susceptibility   Methicillin resistant staphylococcus aureus - MIC*    CIPROFLOXACIN >=8 RESISTANT Resistant     ERYTHROMYCIN >=8 RESISTANT Resistant     GENTAMICIN <=0.5 SENSITIVE Sensitive     OXACILLIN >=4 RESISTANT Resistant  TETRACYCLINE <=1 SENSITIVE Sensitive     VANCOMYCIN 1 SENSITIVE Sensitive     TRIMETH/SULFA <=10 SENSITIVE Sensitive     CLINDAMYCIN <=0.25 SENSITIVE Sensitive     RIFAMPIN <=0.5 SENSITIVE Sensitive     Inducible Clindamycin NEGATIVE Sensitive     LINEZOLID 2 SENSITIVE Sensitive     * METHICILLIN RESISTANT STAPHYLOCOCCUS AUREUS  Blood Culture ID Panel (Reflexed)     Status: Abnormal   Collection Time: 07/10/23 12:15 AM  Result Value Ref Range Status   Enterococcus faecalis NOT DETECTED NOT DETECTED Final   Enterococcus Faecium NOT DETECTED NOT DETECTED Final   Listeria monocytogenes NOT DETECTED NOT DETECTED Final   Staphylococcus species DETECTED (A) NOT DETECTED Final    Comment: CRITICAL RESULT CALLED TO, READ BACK BY AND VERIFIED WITH: PHARMD JENNY ZHOU [id number] AT 1234 BY EC    Staphylococcus aureus (BCID) DETECTED (A) NOT DETECTED Final    Comment: Methicillin (oxacillin)-resistant Staphylococcus aureus (MRSA). MRSA is predictably resistant to beta-lactam antibiotics (except ceftaroline). Preferred therapy is vancomycin unless clinically contraindicated. Patient requires contact precautions if  hospitalized. CRITICAL  RESULT CALLED TO, READ BACK BY AND VERIFIED WITH: PHARMD JENNY ZHOU [id number] AT 1234 BY EC    Staphylococcus epidermidis NOT DETECTED NOT DETECTED Final   Staphylococcus lugdunensis NOT DETECTED NOT DETECTED Final   Streptococcus species NOT DETECTED NOT DETECTED Final   Streptococcus agalactiae NOT DETECTED NOT DETECTED Final   Streptococcus pneumoniae NOT DETECTED NOT DETECTED Final   Streptococcus pyogenes NOT DETECTED NOT DETECTED Final   A.calcoaceticus-baumannii NOT DETECTED NOT DETECTED Final   Bacteroides fragilis NOT DETECTED NOT DETECTED Final   Enterobacterales NOT DETECTED NOT DETECTED Final   Enterobacter cloacae complex NOT DETECTED NOT DETECTED Final   Escherichia coli NOT DETECTED NOT DETECTED Final   Klebsiella aerogenes NOT DETECTED NOT DETECTED Final   Klebsiella oxytoca NOT DETECTED NOT DETECTED Final   Klebsiella pneumoniae NOT DETECTED NOT DETECTED Final   Proteus species NOT DETECTED NOT DETECTED Final   Salmonella species NOT DETECTED NOT DETECTED Final   Serratia marcescens NOT DETECTED NOT DETECTED Final   Haemophilus influenzae NOT DETECTED NOT DETECTED Final   Neisseria meningitidis NOT DETECTED NOT DETECTED Final   Pseudomonas aeruginosa NOT DETECTED NOT DETECTED Final   Stenotrophomonas maltophilia NOT DETECTED NOT DETECTED Final   Candida albicans NOT DETECTED NOT DETECTED Final   Candida auris NOT DETECTED NOT DETECTED Final   Candida glabrata NOT DETECTED NOT DETECTED Final   Candida krusei NOT DETECTED NOT DETECTED Final   Candida parapsilosis NOT DETECTED NOT DETECTED Final   Candida tropicalis NOT DETECTED NOT DETECTED Final   Cryptococcus neoformans/gattii NOT DETECTED NOT DETECTED Final   Meth resistant mecA/C and MREJ DETECTED (A) NOT DETECTED Final    Comment: CRITICAL RESULT CALLED TO, READ BACK BY AND VERIFIED WITH: Valarie Merino ZHOU [id number] AT 1234 BY EC Performed at [facility], [address] [place], [name] [address]    Culture, blood (routine x 2)     Status: Abnormal   Collection Time: 07/10/23 12:16 AM   Specimen: BLOOD  Result Value Ref Range Status   Specimen Description BLOOD BLOOD RIGHT HAND  Final   Special Requests   Final    BOTTLES DRAWN AEROBIC AND ANAEROBIC Blood Culture results may not be optimal due to an excessive volume of blood received in culture bottles   Culture  Setup Time   Final    GRAM POSITIVE COCCI IN CLUSTERS IN BOTH AEROBIC AND  ANAEROBIC BOTTLES CRITICAL VALUE NOTED.  VALUE IS CONSISTENT WITH PREVIOUSLY REPORTED AND CALLED VALUE.    Culture (A)  Final    STAPHYLOCOCCUS AUREUS SUSCEPTIBILITIES PERFORMED ON PREVIOUS CULTURE WITHIN THE LAST 5 DAYS. Performed at [facility], [address], [place], [name] [address]    Report Status 07/12/2023 FINAL  Final  SARS Coronavirus 2 by RT PCR (hospital order, performed in [facility] hospital lab) *cepheid single result test*     Status: None   Collection Time: 07/10/23 12:17 AM   Specimen: Nasal Swab  Result Value Ref Range Status   SARS Coronavirus 2 by RT PCR NEGATIVE NEGATIVE Final    Comment: Performed at [facility], [address], [place], [name] [address]  MRSA Next Gen by PCR, Nasal     Status: Abnormal   Collection Time: 07/10/23  3:26 AM   Specimen: Nasal Mucosa; Nasal Swab  Result Value Ref Range Status   MRSA by PCR Next Gen DETECTED (A) NOT DETECTED Final    Comment: RESULT CALLED TO, READ BACK BY AND VERIFIED WITH: B CUMMINGS,RN@0646  07/10/23 MK (NOTE) The GeneXpert MRSA Assay (FDA approved for NASAL specimens only), is one component of a comprehensive MRSA colonization surveillance program. It is not intended to diagnose MRSA infection nor to guide or monitor treatment for MRSA infections. Test performance is not FDA approved in patients less than 71 years old. Performed at [facility], [address], [place], [name] [address]   C Difficile Quick Screen w PCR reflex     Status:  Abnormal   Collection Time: 07/10/23  8:32 AM   Specimen: STOOL  Result Value Ref Range Status   C Diff antigen POSITIVE (A) NEGATIVE Final   C Diff toxin NEGATIVE NEGATIVE Final   C Diff interpretation Results are indeterminate. See PCR results.  Final    Comment: Performed at [facility], [address] [place], [name] [address]  C. Diff by PCR, Reflexed     Status: None   Collection Time: 07/10/23  8:32 AM  Result Value Ref Range Status   Toxigenic C. Difficile by PCR NEGATIVE NEGATIVE Final    Comment: Patient is colonized with non toxigenic C. difficile. May not need treatment unless significant symptoms are present. Performed at [facility], [address], [place], [name] [address]   Culture, blood (Routine X 2) w Reflex to ID Panel     Status: None   Collection Time: 07/11/23  8:21 AM   Specimen: BLOOD LEFT ARM  Result Value Ref Range Status   Specimen Description BLOOD LEFT ARM  Final   Special Requests   Final    BOTTLES DRAWN AEROBIC AND ANAEROBIC Blood Culture adequate volume   Culture   Final    NO GROWTH 5 DAYS Performed at [facility], [address] [place], [name] [address]    Report Status 07/16/2023 FINAL  Final  Culture, blood (Routine X 2) w Reflex to ID Panel     Status: None   Collection Time: 07/11/23  8:44 AM   Specimen: BLOOD RIGHT ARM  Result Value Ref Range Status   Specimen Description BLOOD RIGHT ARM  Final   Special Requests   Final    BOTTLES DRAWN AEROBIC ONLY Blood Culture adequate volume   Culture   Final    NO GROWTH 5 DAYS Performed at [facility], [address] [place], [name] [address]  Report Status 07/16/2023 FINAL  Final  Culture, blood (Routine X 2) w Reflex to ID Panel     Status: None (Preliminary result)   Collection Time: 07/15/23  3:00 PM   Specimen: BLOOD RIGHT ARM  Result Value Ref Range Status   Specimen Description BLOOD RIGHT ARM  Final   Special Requests   Final    BOTTLES DRAWN  AEROBIC AND ANAEROBIC Blood Culture adequate volume   Culture   Final    NO GROWTH 4 DAYS Performed at [facility], [address] [place], [name] [address]    Report Status PENDING  Incomplete  Culture, blood (Routine X 2) w Reflex to ID Panel     Status: None (Preliminary result)   Collection Time: 07/15/23  3:02 PM   Specimen: BLOOD RIGHT ARM  Result Value Ref Range Status   Specimen Description BLOOD RIGHT ARM  Final   Special Requests   Final    BOTTLES DRAWN AEROBIC AND ANAEROBIC Blood Culture adequate volume   Culture   Final    NO GROWTH 4 DAYS Performed at [facility], [address], [place], [name] [address]    Report Status PENDING  Incomplete  Culture, Respiratory w Gram Stain     Status: None (Preliminary result)   Collection Time: 07/19/23 12:55 AM   Specimen: Tracheal Aspirate; Respiratory  Result Value Ref Range Status   Specimen Description TRACHEAL ASPIRATE  Final   Special Requests NONE  Final   Gram Stain   Final    FEW WBC PRESENT, PREDOMINANTLY PMN FEW GRAM POSITIVE COCCI FEW GRAM POSITIVE RODS Performed at [facility], [address], [place], [name] [address]    Culture PENDING  Incomplete   Report Status PENDING  Incomplete     Marcos Eke, NP [facility]  07/19/2023  12:08 PM

## 2023-07-19 NOTE — Consult Note (Addendum)
Palliative Care Consult Note                                  Date: 07/19/2023   Patient Name: [name]  DOB: [birth year]  MRN: [id number]  Age / Sex: 42 y.o., male     Reason for Consultation: Establishing goals of care  HPI/Patient Profile: 42 y.o. male  with past medical history of ESRD on dialysis, insulin-dependent DM, anemia, left AKA, gastroparesis, and hypertension.  He presented to the ED on 07/09/2023 with altered mental status. On presentation, his dialysis catheter was found to be partially removed.  He was also found to be in DKA.  He is admitted with septic shock due to MRSA bacteremia and infective endocarditis. Hospitalization is complicated by acute ischemic stroke.  Palliative Medicine was consulted for goals of care and  medical decision making.  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   I met with patient at bedside to discuss diagnosis, prognosis, GOC, disposition, and options.  I introduced Palliative Medicine as specialized medical care for people living with serious illness. It focuses on providing relief from the symptoms and stress of a serious illness.   We discussed patient's current illness and what it means in the larger context of his ongoing co-morbidities. Current clinical status was reviewed.   Created space and opportunity for patient to express thoughts and feelings regarding current medical situation. Values and goals of care were attempted to be  elicited.  Life Review: Patient is on disability. He is not married. He has 2 daughters.  Functional Status: Prior to admission, patient lived with his mother. He is unable to tell me how much help he was requiring with ADLs. He tells me he is able to drive himself to dialysis, but chart review suggests he receives transport services through [place].   Today's Discussion: Throughout our encounter, patient's speech is tangential and he often deflects when I ask direct questions. He is forgetful of details regarding his care, and does not seem to have good insight into his current medical condition.   Education offered to patient regarding the seriousness of his current medical situation. I reviewed his multiple acute and chronic issues including ESRD on dialysis, diabetes, infective endocarditis, acute stroke with hemiparesis, and acute heart failure with low EF.   He tells me he is "not someone to give up". He feels that he can recover, but it will take "time and determination".  Discussed current recommendation from PT is for short-term rehab. He states he is willing to go to rehab, if he can "come and go" as he wants.   I attempted to call patient's mother, no answer.           Review of Systems  Neurological:  Positive for weakness and headaches.    Objective:   Primary Diagnoses: Present on Admission:  DKA (diabetic ketoacidosis) ([name])  MRSA  bacteremia  Endocarditis of mitral valve   Physical Exam Vitals reviewed.  Constitutional:      General: He is awake. He is not in acute distress.    Appearance: He is ill-appearing.  Pulmonary:     Effort: Pulmonary effort is normal.  Musculoskeletal:     Left Lower Extremity: Left leg is amputated above knee.  Neurological:     Comments: Left sided hemiplegia  Psychiatric:     Comments: Poor insight     Palliative Assessment/Data: PPS 30%     Assessment & Plan:   SUMMARY OF RECOMMENDATIONS   Continue full scope  care PMT will continue to follow  Primary Decision Maker: Patient with poor insight; does not appear to have capacity to make complex medical decisions at this time Next of kin is his mother/Anita - I will continue attempts to contact her  Code Status/Advance Care Planning: Full code  Symptom Management:  Per attending  Prognosis:  The patient is high risk to decompensate secondary to his multiple comorbidities.  Discharge Planning:  To Be Determined    Thank you for allowing [place] to participate in the care of [name]  Time: 75 minutes  Detailed review of medical records (labs, imaging, vital signs), medically appropriate exam, discussed with treatment team, counseling and education to patient, family, & staff, documenting clinical information, medication management, coordination of care.   Signed by: Sherlean Foot, NP Palliative Medicine Team  Team Phone # [phone]  For individual providers, please see AMION

## 2023-07-19 NOTE — Progress Notes (Addendum)
Initial Nutrition Assessment  DOCUMENTATION CODES:   Not applicable  INTERVENTION:   Initiate tube feeding via OG tube: Vital 1.5 at 30 ml/h, increase by 10 ml every 8 hours to goal rate of 60 ml/h (1440 ml per day) Prosource TF20 60 ml BID  Provides 2320 kcal, 137 gm protein, 1100 ml free water daily.  Add B-complex with vitamin C if CRRT required.  NUTRITION DIAGNOSIS:   Increased nutrient needs related to acute illness, chronic illness (ESRD on HD, may require CRRT) as evidenced by estimated needs.  GOAL:   Patient will meet greater than or equal to 90% of their needs  MONITOR:   TF tolerance, I & O's, Vent status  REASON FOR ASSESSMENT:   Ventilator    ASSESSMENT:   42 yo male admitted with DKA, AMS. PMH includes ESRD on HD, DM on insulin, diabetic gastroparesis, L AKA, HTN, R metatarsal amputation.  Hospitalization has been complicated by infective endocarditis (plans for repeat TEE in the next few days), acute ischemic stroke (plans for outpatient follow up with Neuro), malpositioned HD catheter (line holiday 10/16-10/18, then L femoral TDC placed), incidental finding of liver steatosis vs liver mass (plans for outpatient MRI).   SLP following for cognitive and swallowing therapies. Diet was downgraded to dysphagia 3 with thin liquids on 10/18. Patient was eating 0-75% of meals.   Patient developed acute respiratory failure overnight from presumed aspiration after vomiting and required intubation.  Patient is currently intubated on ventilator support. MV: 9.2 L/min Temp (24hrs), Avg:99.4 F (37.4 C), Min:97.8 F (36.6 C), Max:101.5 F (38.6 C)   Labs reviewed. Na 132, BUN 41, creat 10, phos 4.7 CBG: 100-91-139-203  Medications reviewed and include calcitriol, aranesp, colace, pepcid, novolog, MVI with minerals, miralax, renvela, precedex, fentanyl, levophed.  No UOP x 24 hours Last HD 10/18 with 1.6 L UF Plans to hold off on HD today since patient is  requiring pressors. May need CRRT if unable to wean pressors tomorrow.  Weight history reviewed. Usual weight 77.6 kg 04/14/23. Slowly decreasing since admission. Currently 65.9 kg (ICU weight from last night).  NUTRITION - FOCUSED PHYSICAL EXAM:  Unable to complete at this time, RD working remotely  Diet Order:   Diet Order     None       EDUCATION NEEDS:   Not appropriate for education at this time  Skin:  Skin Assessment: Reviewed RN Assessment (skin tear to sacrum)  Last BM:  10/20  Height:   Ht Readings from Last 1 Encounters:  07/19/23 5\' 7"  (1.702 m)    Weight:   Wt Readings from Last 1 Encounters:  07/19/23 65.9 kg    Ideal Body Weight:  61.9 kg (adjusted for L AKA)  BMI:  Body mass index is 24.7 kg/m (adjusted for L AKA)  Estimated Nutritional Needs:   Kcal:  2000-2300  Protein:  100-130 gm (up to 165 gm with CRRT)  Fluid:  1 L + UOP   Gabriel Rainwater RD, LDN, CNSC Please refer to Amion for contact information.

## 2023-07-19 NOTE — Progress Notes (Signed)
SLP Cancellation Note  Patient Details Name: SENAI HAVNER MRN: [id number] DOB: 09-02-1981   Cancelled treatment:       Reason Eval/Treat Not Completed: Medical issues which prohibited therapy- pt intubated after vomiting and going into respiratory distress.  SLP will sign off. Please re-order after extubated.  Lam Bjorklund L. Samson Frederic, MA CCC/SLP Clinical Specialist - Acute Care SLP Acute Rehabilitation Services Office number [phone]    [name] 07/19/2023, 10:24 AM

## 2023-07-19 NOTE — Progress Notes (Signed)
[facility] NEPHROLOGY PROGRESS NOTE  Assessment/ Plan: AMS: MRI with acute stroke, neurology is following.  Sepsis - hx of MRSA bacteremia in 03/2023. On vanc. BCx's + for MRSA 10/12, 10/13 NGTD at 5 days. TEE 10/16 with vegetations - CT surgery requests repeat TEE 2 wks. he had a catheter holiday and then femoral TDC was placed by IR on 10/18.  AHRF -possibly secondary to aspiration. Intubated 10/21 -per PCCM  Shock -pressor support per primary serivce  ESRD - on HD MWF.  Hold off on dialysis today given need for pressors. If unable to wean pressors by tomorrow then will need to consider CRRT at that time.  Dialysis access - Per IR note, occluded both right and left subclavian on venography therefore successful placement of left femoral TDC with the tip of the catheter in the infrarenal IVC.  HTN/volume - UF as tolerated. On pressors as above. 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 Transfuse PRN for hgb <7. 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 Resume sensipar when taking PO. Last PO4 acceptable   OP HD: MWF [facility] 4h  400/1.5    77kg   2/3 bath  [facility] TDC    Heparin none - last OP HD 10/7, post wt 74.6kg - coming off 75- 76kg - misses HD once every other week approx - rocaltrol 1.50 mcg  - no esa  Discussed with primary service and ICU RN.  Anthony Sar, MD [facility]  Subjective:  Seen and examined at bedside in ICU. Events overnight: worsening hypoxia and AMS after vomiting. Hypoxia on NRB. Transferred to ICU, subsequently intubated. Currently on pressors.  Objective Vital signs in last 24 hours: Vitals:   07/19/23 0730 07/19/23 0740 07/19/23 0745 07/19/23 0800  BP: (!) 108/49  (!) 113/59 (!) 104/44  Pulse:  (!) 104    Resp: 10 18 19  (!) 9  Temp:      TempSrc:      SpO2:  100%    Weight:      Height:       Weight change: -7.5 kg  Intake/Output Summary (Last 24  hours) at 07/19/2023 0818 Last data filed at 07/19/2023 0800 Gross per 24 hour  Intake 800.12 ml  Output 375 ml  Net 425.12 ml       Labs: RENAL PANEL Recent Labs  Lab 07/13/23 0507 07/14/23 0910 07/15/23 0609 07/16/23 1835 07/19/23 0207 07/19/23 0434  NA 132* 127* 131* 131* 131* 134*  K 3.6 4.3 4.1 3.5 3.6 4.1  CL 95* 92* 89* 91*  --  92*  CO2 21* 16* 14* 21*  --  22  GLUCOSE 89 191* 332* 223*  --  106*  BUN 51* 62* 49* 61*  --  39*  CREATININE 10.30* 11.70* 8.54* 10.04*  --  9.46*  CALCIUM 9.1 8.1* 8.3* 7.9*  --  8.6*  MG  --   --   --   --   --  2.7*  PHOS  --   --  5.2* 3.7  --  4.7*  ALBUMIN  --   --   --  2.0*  --   --     Liver Function Tests: Recent Labs  Lab 07/16/23 1835  ALBUMIN 2.0*   No results for input(s): "LIPASE", "AMYLASE" in the last 168 hours. No results for input(s): "AMMONIA" in the last 168 hours.  CBC: Recent Labs  07/14/23 0910 07/15/23 0609 07/16/23 1835 07/19/23 0207 07/19/23 0434  HGB 9.2* 9.3* 8.9* 11.6* ACCURACY OF RESULTS QUESTIONABLE. RECOMMEND RECOLLECT TO VERIFY.  MCV 87.1 88.8 85.3  --  ACCURACY OF RESULTS QUESTIONABLE. RECOMMEND RECOLLECT TO VERIFY.    Cardiac Enzymes: No results for input(s): "CKTOTAL", "CKMB", "CKMBINDEX", "TROPONINI" in the last 168 hours. CBG: Recent Labs  Lab 07/18/23 2020 07/19/23 0004 07/19/23 0118 07/19/23 0206 07/19/23 0408  GLUCAP 176* 76 69* 100* 91    Iron Studies: No results for input(s): "IRON", "TIBC", "TRANSFERRIN", "FERRITIN" in the last 72 hours. Studies/Results: DG Chest Port 1 View  Result Date: 07/19/2023 CLINICAL DATA:  Endotracheal tube placement EXAM: PORTABLE CHEST 1 VIEW COMPARISON:  07/18/2023 FINDINGS: An endotracheal tube has been placed with tip measuring 6.3 cm above the carina. An enteric tube was placed with tip projecting over the left upper quadrant consistent with location in the body of the stomach. Cardiac enlargement. Patchy perihilar infiltration  mostly on the left. No pleural effusions. No pneumothorax. Mediastinal contours appear intact. Vascular graft in the left axilla and right subclavian region. IMPRESSION: Appliances appear in satisfactory position. Cardiac enlargement. Patchy perihilar infiltrates, greater on the left. Electronically Signed   By: Burman Nieves M.D.   On: 07/19/2023 01:14   DG Chest Port 1 View  Result Date: 07/18/2023 CLINICAL DATA:  Acute respiratory distress.  Left-sided stroke. EXAM: PORTABLE CHEST 1 VIEW COMPARISON:  07/10/2023 FINDINGS: Left central venous catheter has been removed in the interval. Vascular grafts in the axilla and right subclavian region. Shallow inspiration. Mild cardiac enlargement. Patchy infiltrates in the left mid lung and right lung base, possibly atelectasis or pneumonia. No pleural effusions. No pneumothorax. IMPRESSION: Patchy infiltrates in the left mid lung and right lung base, possibly atelectasis or pneumonia. Cardiac enlargement. Electronically Signed   By: Burman Nieves M.D.   On: 07/18/2023 22:02    Medications: Infusions:  sodium chloride Stopped (07/19/23 0242)   ceFEPime (MAXIPIME) IV     dexmedetomidine (PRECEDEX) IV infusion 0.4 mcg/kg/hr (07/19/23 0800)   fentaNYL infusion INTRAVENOUS 75 mcg/hr (07/19/23 0800)   norepinephrine (LEVOPHED) Adult infusion 14 mcg/min (07/19/23 0800)   vancomycin Stopped (07/14/23 1842)    Scheduled Medications:  calcitRIOL  1.5 mcg Per Tube Q M,W,F   Chlorhexidine Gluconate Cloth  6 each Topical Q0600   darbepoetin (ARANESP) injection - DIALYSIS  40 mcg Subcutaneous Q Fri-1800   dextrose       docusate  100 mg Per Tube BID   famotidine  10 mg Per Tube Daily   gabapentin  100 mg Per Tube Q8H   heparin injection (subcutaneous)  5,000 Units Subcutaneous Q8H   heparin sodium (porcine)  4,600 Units Intracatheter Once   insulin aspart  0-6 Units Subcutaneous Q4H   midazolam  2 mg Intravenous Once   multivitamin with minerals  1  tablet Per Tube QHS   mouth rinse  15 mL Mouth Rinse Q2H   polyethylene glycol  17 g Per Tube Daily   sevelamer carbonate  2,400 mg Per Tube TID WC    have reviewed scheduled and prn medications.  Physical Exam: General: ill appearing, sedated/intubated Heart:RRR Lungs: intubated, b/l chest expansion Abdomen:soft, Non-tender, non-distended Extremities:No edema, left aka Neuro: sedated Dialysis Access: Femoral TDC site clean.  Ryen Rhames 07/19/2023,8:18 AM  LOS: 9 days

## 2023-07-19 NOTE — Progress Notes (Signed)
Palliative Medicine Progress Note   Patient Name: [name]       Date: 07/19/2023 DOB: October 19, 1980  Age: 42 y.o. MRN#: [id number] Attending Physician: Josephine Igo, DO Primary Care Physician: [name], NP Admit Date: 07/09/2023  Reason for Consultation/Follow-up: {Reason for Consult:23484}  HPI/Patient Profile: 42 y.o. male  with past medical history of ESRD on dialysis, insulin-dependent DM, anemia, left AKA, gastroparesis, and hypertension.  He presented to the ED on 07/09/2023 with altered mental status. On presentation, his dialysis catheter was found to be partially removed.  He was also found to be in DKA.  He is admitted with septic shock due to MRSA bacteremia. Palliative Medicine was consulted for goals of care and  medical decision makin  Subjective: Chart reviewed. Patient decompensated overnight, transferred to [facility].  Bedside visit and   Objective:  Physical Exam          Vital Signs: BP 107/83   Pulse 78   Temp (!) 97.2 F (36.2 C) (Axillary)   Resp 16   Ht 5\' 7"  (1.702 m)   Wt 65.9 kg   SpO2 99%   BMI 22.75 kg/m  SpO2: SpO2: 99 % O2 Device: O2 Device: Ventilator O2 Flow Rate: O2 Flow Rate (L/min): 15 L/min  Intake/output summary:  Intake/Output Summary (Last 24 hours) at 07/19/2023 2323 Last data filed at 07/19/2023 2100 Gross per 24 hour  Intake 1849.64 ml  Output 500 ml  Net 1349.64 ml    LBM: Last BM Date : 07/18/23     Palliative Assessment/Data: ***     Palliative Medicine Assessment & Plan   Assessment: Principal Problem:   Endocarditis of mitral valve Active Problems:   DKA (diabetic ketoacidosis) ([name])   Altered mental status   MRSA bacteremia   Hemodialysis catheter infection ([name])   Right elbow pain   ESRD on  hemodialysis ([name])   Acute systolic CHF (congestive heart failure) ([name])   DCM (dilated cardiomyopathy) ([name])   Coronary artery calcification seen on CAT scan   Infective endocarditis of tricuspid valve   Acute respiratory failure with hypoxia ([name])    Recommendations/Plan: ***  Goals of Care and Additional Recommendations: Limitations on Scope of Treatment: {Recommended Scope and Preferences:21019}  Code Status:   Prognosis:  {Palliative Care Prognosis:23504}  Discharge Planning: {Palliative dispostion:23505}  Care plan was discussed with ***  Thank you for allowing the Palliative Medicine Team to assist in the care of this patient.   ***   Merry Proud, NP   Please contact Palliative Medicine Team phone at [phone] for questions and concerns.  For individual providers, please see AMION.

## 2023-07-19 NOTE — Progress Notes (Signed)
Sputum sample collected and sent to lab.

## 2023-07-19 NOTE — Progress Notes (Signed)
Pharmacy Antibiotic Note  [name] is a 42 y.o. male admitted on 07/09/2023 with sepsis, found down. MRSA bacteremia. ESRD w/ last HD 10/18. Pt decompensated overnight, pharmacy to add cefepime.   Plan: Cefepime 2g IV then 2g qHD MWF Continue Vancomycin 750 mg post HD MWF - next HD session on Monday Follow HD schedule  Height: 5\' 7"  (170.2 cm) Weight: 73.4 kg (161 lb 13.1 oz) IBW/kg (Calculated) : 66.1  Temp (24hrs), Avg:98.5 F (36.9 C), Min:97.8 F (36.6 C), Max:99.3 F (37.4 C)  Recent Labs  Lab 07/12/23 0520 07/13/23 0507 07/14/23 0910 07/15/23 0609 07/15/23 1500 07/16/23 1835  WBC 12.1*  --  15.3* 13.7*  --  14.9*  CREATININE 8.83* 10.30* 11.70* 8.54*  --  10.04*  VANCORANDOM  --   --   --   --  25  --     Estimated Creatinine Clearance: 8.9 mL/min (A) (by C-G formula based on SCr of 10.04 mg/dL (H)).    No Known Allergies  Microbiology results: 10/112 covid negative 10/12 BCx: MRSA 10/12 MRSA PCR: positive 10/12 Cdiff PCR positive, toxin negative 10/13 Bcx: ngtdF 10/17 Bcx: ngtd  Thank you for allowing pharmacy to be a part of this patient's care.  Fredonia Highland, PharmD, BCPS, [facility] Clinical Pharmacist Please check AMION for all [facility] numbers 07/19/2023

## 2023-07-19 NOTE — Progress Notes (Signed)
[name]'s white blood cells came back at 73.5.  This was a redraw after the first blood draw this morning also came back extremely high and was thought to be inaccurate.  I let Rutherford Guys, PA with critical care know of the results as well as Jeanine Luz, NP with infectious disease.

## 2023-07-19 NOTE — Procedures (Signed)
Intubation Procedure Note  [name]  [id number]  25-Jan-1981  Date:07/19/23  Time:1:08 AM   Provider Performing:Janetta Vandoren D Suzie Portela    Procedure: Intubation (31500)  Indication(s) Respiratory Failure  Consent Unable to obtain consent due to emergent nature of procedure.   Anesthesia Etomidate, Versed, and Fentanyl   Time Out Verified patient identification, verified procedure, site/side was marked, verified correct patient position, special equipment/implants available, medications/allergies/relevant history reviewed, required imaging and test results available.   Sterile Technique Usual hand hygeine, masks, and gloves were used   Procedure Description Patient positioned in bed supine.  Sedation given as noted above.  Patient was intubated with endotracheal tube using Glidescope.  View was Grade 1 full glottis .  Number of attempts was 1.  Colorimetric CO2 detector was consistent with tracheal placement.   Complications/Tolerance None; patient tolerated the procedure well. Chest X-ray is ordered to verify placement.   EBL Minimal   Specimen(s) None  JD Anselm Lis  Pulmonary & Critical Care 07/19/2023, 1:08 AM  Please see [email or web address] for pager details.  From 7A-7P if no response, please call [phone]. After hours, please call ELink [phone].

## 2023-07-19 NOTE — Progress Notes (Signed)
Pt was seen for AMS and hypoxia.   He developed worsening hypoxia and lethargy last night after vomiting, was found to be hypoglycemic, and had initial improvement in mental status with IV dextrose and increase in FiO2. He was transferred to progressive unit where he then became more lethargic.   HR 120s, RR upper 20s, BP normal. He is awake but lethargic and not responding verbally. Rales noted bilaterally. O2 saturation is in 80s on NRB.   PCCM team came to bedside to assess patient. He is being transferred to ICU.

## 2023-07-19 NOTE — Progress Notes (Signed)
Pt mother, Cordel Whipp, called and notified that pt was transferred to [facility] ICU. Phone number for [facility] given to mrs. Leach.

## 2023-07-19 NOTE — Progress Notes (Signed)
NAME:  [name], MRN:  [id number], DOB:  December 07, 1980, LOS: 9 ADMISSION DATE:  07/09/2023, CONSULTATION DATE: 07/10/2023 REFERRING MD:  Dr Suezanne Jacquet, CHIEF COMPLAINT: DKA, altered mental status  History of Present Illness:  Patient is a 42 yo M w/ pertinent PMH ESRD on HD MWF, IDDM, gastroparesis, left AKA, HTN presents to [facility] on 10/11 w/ AMS.   Patient recently admitted to [facility] on 7/12 sepsis from right diabetic foot ulcer. Cultures w/ MRSA. Patient had dialysis catheter removed and replaced. ID treated w/ vanc. Patient refused echo and left on 7/17 against medical advise.    On 10/11 patient was found by family member on ground altered. EMS arrived and dialysis catheter partially removed. Patient more alert for them. Has been complaining of diarrhea x1 week per family. Unsure if patient went to dialysis today. Transferred to [facility] ED. On arrival bp stable 107/70 and afebrile. Patient remains confused and occasionally with agitation. Given dilaudid. CBG 497. Beta H >8. VBG ph 7.06, 16, 54, 4.7. Given iv fluids and started on insulin drip for DKA. CT head negative for acute abnormality. Cxr no significant findings. CT abd/pelvis no acute findings; possible steatosis vs. Liver mass 7 x 4 cm recommending f/u w/ MRI. WBC 20.7 and LA 2.4. Sepsis protocol initiated. Cultures obtained and started on vanc/zosyn. Given ams, dka, and soft bp pccm consulted for icu admission.  10/21 pccm called overnight to assess for worsening hypoxia and AMS after vomiting. On NRB w/ sats 70-80s. Hypoglycemic given dextrose which improved his mental status some. Transferred to 2H icu for intubation.   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 Wakens to noxious stimuli but does not follow commands.  Objective   Blood pressure (!) 109/33, pulse 100, temperature 98.4 F (36.9 C), temperature source Oral, resp. rate 18, height 5\' 7"  (1.702 m), weight 65.9 kg, SpO2 97%.    Vent Mode: PRVC FiO2 (%):  [40 %-100 %] 40 % Set Rate:  [18 bmp] 18 bmp Vt Set:  [520 mL] 520 mL PEEP:  [10 cmH20] 10 cmH20 Plateau Pressure:  [20 cmH20-27 cmH20] 20 cmH20   Intake/Output Summary (Last 24 hours) at 07/19/2023 9323 Last data filed at 07/19/2023 0900 Gross per 24 hour  Intake 964.82 ml  Output 375 ml  Net 589.82 ml   Filed Weights   07/16/23 1806 07/18/23 0500 07/19/23 0450  Weight: 75.4 kg 73.4 kg 65.9 kg    Examination: General:  critically ill appearing M, in NAD HEENT: MM pink/moist, ETT in place Neuro: sedated, awakens to noxious stimuli but does not follow commands CV: s1s2, RRR, no m/r/g PULM:  coarse bilaterally GI: soft, bs dim Extremities: warm/dry, left aka w/ left femoral tunneled HD cath in place  Skin: Old healed lesions  noted to RLE. L AKA.    Assessment & Plan:   Acute respiratory failure w/ hypoxia - presumed aspiration. S/p intubation overnight 10/21 Possible aspiration Plan: -full vent support -SBT when able -Continue Cefepime, Vanc - follow cultures, prelim 10/21 with GPCs and GPRs - CXR intermittently  Septic shock MRSA bacteremia dating back to July 2024 Plan: -cont vanc and add cefepime for possible pna -check trach aspirate -wean levo for map goal >65 -ID following; appreciate recs -trend  wbc/fever curve -trend LA  Infective endocarditis Plan: -TCTS following; no plan for surgery for now; plan to cont 6 weeks antibiotics - Cards planning for repeat TEE 10/23. If considering extubation prior to that (? 10/22), will notify cards  Acute metabolic encephalopathy Acute ischemic stroke/septic infarct  10/17- neuro has signed off and recommended outpatient follow up Plan: -limit sedating meds -cont antibiotics as above -neuro OP follow up  Combined systolic/diastolic chf Plan: -GDMT when appropriate -HD for volume removal  DKA: improved Hypoglycemia Plan: -ssi and cbg monitoring -hold basal insulin   End-stage renal disease on hemodialysis Malpositioned catheter Plan: -nephro following for HD -Trend BMP / urinary output -Replace electrolytes as indicated -Avoid nephrotoxic agents, ensure adequate renal perfusion  Anemia/thrombocytopenia Plan: -trend cbc  Incidental finding of Liver steatosis vs. Liver mass -CT abd/pelvis showing 7 x 4 cm density in anterior liver  Plan: -f/u outpt w/ MRI  Best Practice (right click and "Reselect all SmartList Selections" daily)   Diet/type: npo DVT prophylaxis: prophylactic heparin  GI prophylaxis: H2B Lines: Dialysis Catheter Foley:  N/A Code Status:  full code Last date of multidisciplinary goals of care discussion [10/21 attempted to reach family over phone but no answer; palliative care already consulted to help w/ GOC conversations]   CC time: 35 min.   Rutherford Guys, PA - [facility] For pager details, please see AMION or use Epic chat  After 1900, please call [facility] for cross coverage needs 07/19/2023, 9:45 AM

## 2023-07-19 NOTE — Progress Notes (Addendum)
Pt arrived to [address] [place] [address] from 2 west at 2250 on 07/18/23. Alert and oriented to self, place and time. Identified appropriately. Pt seems to be in mild respiratory distress, using accessory muscles to breath and pt very lethargic. At the time of arrival, pt on HFNC on 10 L/min, but constantly taking nasal cannula off. Oxygen at this time in low 90s and upper 80s with O2, and in 70s when he pulls oxygen out. At 2329, Opyd, MD paged about HR being in 120s and temp of 101.5 Pt extremities cold to touch, and we are having difficulty reading SpO2. SpO2 in 70s on 15L/min on HFNC.  Pt lung sounds rhonchi in upper lobes and diminished in lower lobes.   Around 2340 pt coughed and spit what looked like a food residual. SpO2 in low 70s on HFNC 15L, pt placed on non-rebreather without improvement. Rapid response RN called. Charge RN aware.  Rapid at the bedside assessing pt, MD at the bedside as well.  At 2326 pt transferred to [facility] 21.

## 2023-07-19 NOTE — Inpatient Diabetes Management (Signed)
Inpatient Diabetes Program Recommendations  AACE/ADA: New Consensus Statement on Inpatient Glycemic Control (2015)  Target Ranges:  Prepandial:   less than 140 mg/dL      Peak postprandial:   less than 180 mg/dL (1-2 hours)      Critically ill patients:  140 - 180 mg/dL   Lab Results  Component Value Date   GLUCAP 203 (H) 07/19/2023   HGBA1C 8.9 (H) 07/10/2023    Review of Glycemic Control  Latest Reference Range & Units 07/19/23 02:06 07/19/23 04:08 07/19/23 08:17 07/19/23 11:59  Glucose-Capillary 70 - 99 mg/dL 161 (H) 91 096 (H) 045 (H)   Diabetes history: Type 1 DM  Outpatient Diabetes medications:  Novolin R 12 units TID, Novolin 70/30 10 units BID  Current orders for Inpatient glycemic control:  Novolog 0-6 units q 4 hours  Inpatient Diabetes Program Recommendations:    Note hypoglycemia yesterday evening and overnight.  Patient received 6 units of Novolog 1309 and then 10 units of Novolog at 1442 when blood sugar was 417 mg/dL on 40/98 and blood sugar down to 58 mg/dL at 1191 which was treated with D50 and close monitoring.  Agree q 4 hour Novolog (0-6 units).  May need to restart Semglee 5 units daily.   Thanks,  Beryl Meager, RN, BC-ADM Inpatient Diabetes Coordinator Pager [phone]  (8a-5p)

## 2023-07-19 NOTE — Progress Notes (Signed)
Remains intubated. Plan was for a follow up TEE in a week (so around 07/21/2023). Please let us know if there are plans to extubate him sooner than that. It is logistically easier and probably safer to get the TEE while still on the ventilator.

## 2023-07-19 NOTE — Progress Notes (Signed)
Pt hypotensive requiring PIV levophed gtt >2mcg. Instructed to maintain systolic BP >100 by CCM as titration parameters.

## 2023-07-19 NOTE — Progress Notes (Signed)
NAME:  [name], MRN:  [id number], DOB:  1980/12/09, LOS: 9 ADMISSION DATE:  07/09/2023, CONSULTATION DATE: 07/10/2023 REFERRING MD:  Dr Suezanne Jacquet, CHIEF COMPLAINT: DKA, altered mental status  History of Present Illness:  Patient is a 42 yo M w/ pertinent PMH ESRD on HD MWF, IDDM, gastroparesis, left AKA, HTN presents to [facility] on 10/11 w/ AMS.   Patient recently admitted to [facility] on 7/12 sepsis from right diabetic foot ulcer. Cultures w/ MRSA. Patient had dialysis catheter removed and replaced. ID treated w/ vanc. Patient refused echo and left on 7/17 against medical advise.    On 10/11 patient was found by family member on ground altered. EMS arrived and dialysis catheter partially removed. Patient more alert for them. Has been complaining of diarrhea x1 week per family. Unsure if patient went to dialysis today. Transferred to [facility] ED. On arrival bp stable 107/70 and afebrile. Patient remains confused and occasionally with agitation. Given dilaudid. CBG 497. Beta H >8. VBG ph 7.06, 16, 54, 4.7. Given iv fluids and started on insulin drip for DKA. CT head negative for acute abnormality. Cxr no significant findings. CT abd/pelvis no acute findings; possible steatosis vs. Liver mass 7 x 4 cm recommending f/u w/ MRI. WBC 20.7 and LA 2.4. Sepsis protocol initiated. Cultures obtained and started on vanc/zosyn. Given ams, dka, and soft bp pccm consulted for icu admission.  10/21 pccm called overnight to assess for worsening hypoxia and AMS after vomiting. On NRB w/ sats 70-80s. Hypoglycemic given dextrose which improved his mental status some. Transferred to 2H icu for intubation.   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 rate 16, weight 73.4 kg, SpO2 96%.        Intake/Output Summary (Last 24 hours) at 07/19/2023 0016 Last data filed at 07/18/2023 7425 Gross per 24 hour  Intake 388 ml  Output --  Net 388 ml   Filed Weights   07/15/23 0500 07/16/23 1806 07/18/23 0500  Weight: 74.5 kg 75.4 kg 73.4 kg    Examination: General:  critically ill appearing with respiratory distress HEENT: MM pink/moist Neuro: awake but lethargic; perrl; mae spontaneously CV: s1s2, RRR, no m/r/g PULM:  coarse rhonchi BS bilaterally; NRB sats 80% GI: soft, bs dim Extremities: warm/dry, left aka w/ left femoral tunneled HD cath in place  Skin: no rashes or lesions   Resolved Hospital Problem list     Assessment & Plan:   Acute respiratory failure w/ hypoxia Possible aspiration Plan: -will intubate  -LTVV strategy with tidal volumes of 6-8 cc/kg ideal body weight -check ABG and adjust settings accordingly -Wean PEEP/FiO2 for SpO2 >92% -VAP bundle in place -Daily SAT and  SBT -PAD protocol in place -wean sedation for RASS goal 0 to -1 -tracheal aspirate -will add cefepime; cont vanc  Septic shock MRSA bacteremia Plan: -cont vanc and add cefepime for possible pna -check trach aspirate -wean levo for map goal >65 -ID following; appreciate recs -trend wbc/fever curve -trend LA  Infective endocarditis Plan: -TCTS following; no plan for surgery for now; plan to cont 6 weeks antibiotics  Acute metabolic  encephalopathy Acute ischemic stroke/septic infarct Plan: -neuro signed off -limit sedating meds -cont antibiotics as above  Combined systolic/diastolic chf Plan: -GDMT when appropriate -HD for volume removal  DKA: improved Hypoglycemia Plan: -ssi and cbg monitoring -hold basal insulin   End-stage renal disease on hemodialysis Malpositioned catheter Plan: -nephro following for HD -Trend BMP / urinary output -Replace electrolytes as indicated -Avoid nephrotoxic agents, ensure adequate renal perfusion  Anemia/thrombocytopenia Plan: -trend cbc  Incidental finding of Liver steatosis vs. Liver mass -CT abd/pelvis showing 7 x 4 cm density in anterior liver  Plan: -f/u outpt w/ MRI  Best Practice (right click and "Reselect all SmartList Selections" daily)   Diet/type: npo DVT prophylaxis: prophylactic heparin  GI prophylaxis: H2B Lines: Dialysis Catheter Foley:  N/A Code Status:  full code Last date of multidisciplinary goals of care discussion [10/21 attempted to reach family over phone but no answer; palliative care already consulted to help w/ GOC conversations]  Labs   CBC: Recent Labs  Lab 07/12/23 0520 07/14/23 0910 07/15/23 0609 07/16/23 1835  WBC 12.1* 15.3* 13.7* 14.9*  HGB 10.0* 9.2* 9.3* 8.9*  HCT 29.9* 28.4* 29.3* 27.8*  MCV 82.4 87.1 88.8 85.3  PLT 93* 108* 166 168    Basic Metabolic Panel: Recent Labs  Lab 07/12/23 0520 07/13/23 0507 07/14/23 0910 07/15/23 0609 07/16/23 1835  NA 131* 132* 127* 131* 131*  K 3.3* 3.6 4.3 4.1 3.5  CL 96* 95* 92* 89* 91*  CO2 22 21* 16* 14* 21*  GLUCOSE 100* 89 191* 332* 223*  BUN 39* 51* 62* 49* 61*  CREATININE 8.83* 10.30* 11.70* 8.54* 10.04*  CALCIUM 8.6* 9.1 8.1* 8.3* 7.9*  PHOS  --   --   --  5.2* 3.7   GFR: Estimated Creatinine Clearance: 8.9 mL/min (A) (by C-G formula based on SCr of 10.04 mg/dL (H)). Recent Labs  Lab 07/12/23 0520 07/14/23 0910 07/15/23 0609 07/16/23 1835  WBC 12.1*  15.3* 13.7* 14.9*    Liver Function Tests: Recent Labs  Lab 07/16/23 1835  ALBUMIN 2.0*   No results for input(s): "LIPASE", "AMYLASE" in the last 168 hours. No results for input(s): "AMMONIA" in the last 168 hours.   ABG    Component Value Date/Time   HCO3 4.7 (L) 07/10/2023 0014   TCO2 7 (L) 07/10/2023 0014   TCO2 5 (L) 07/10/2023 0014   ACIDBASEDEF 24.0 (H) 07/10/2023 0014   O2SAT 74 07/10/2023 0014     Coagulation Profile: No results for input(s): "INR", "PROTIME" in the last 168 hours.  Cardiac Enzymes: No results for input(s): "CKTOTAL", "CKMB", "CKMBINDEX", "TROPONINI" in the last 168 hours.  HbA1C: Hemoglobin A1C  Date/Time Value Ref Range Status  11/11/2022 12:00 AM 9.5  Final  08/22/2014 06:28 AM 5.8 4.2 - 6.3 % Final    Comment:    The American Diabetes Association recommends that a primary goal of therapy should be <7% and that physicians should reevaluate the treatment regimen in patients with HbA1c values consistently >8%.    HbA1c, POC (controlled diabetic range)  Date/Time Value Ref Range  Status  06/25/2022 02:02 PM 10.6 (A) 0.0 - 7.0 % Final   Hgb A1c MFr Bld  Date/Time Value Ref Range Status  07/10/2023 01:42 AM 8.9 (H) 4.8 - 5.6 % Final    Comment:    (NOTE) Pre diabetes:          5.7%-6.4%  Diabetes:              >6.4%  Glycemic control for   <7.0% adults with diabetes   04/12/2023 03:00 PM 8.7 (H) 4.8 - 5.6 % Final    Comment:    (NOTE) Pre diabetes:          5.7%-6.4%  Diabetes:              >6.4%  Glycemic control for   <7.0% adults with diabetes     CBG: Recent Labs  Lab 07/18/23 1816 07/18/23 1945 07/18/23 2006 07/18/23 2020 07/19/23 0004  GLUCAP 98 58* 59* 176* 76     Critical care time: 50 minutes   JD Anselm Lis [facility] 07/19/2023, 1:33 AM  Please see [email or web address] for pager details.  From 7A-7P if no response, please call [phone]. After hours, please call ELink  [phone].

## 2023-07-20 ENCOUNTER — Inpatient Hospital Stay (HOSPITAL_COMMUNITY): Payer: Medicare HMO

## 2023-07-20 DIAGNOSIS — A419 Sepsis, unspecified organism: Secondary | ICD-10-CM

## 2023-07-20 DIAGNOSIS — I340 Nonrheumatic mitral (valve) insufficiency: Secondary | ICD-10-CM

## 2023-07-20 DIAGNOSIS — I059 Rheumatic mitral valve disease, unspecified: Secondary | ICD-10-CM | POA: Diagnosis not present

## 2023-07-20 DIAGNOSIS — J9601 Acute respiratory failure with hypoxia: Secondary | ICD-10-CM | POA: Diagnosis not present

## 2023-07-20 LAB — CBC
HCT: 34.7 % — ABNORMAL LOW (ref 39.0–52.0)
Hemoglobin: 11.1 g/dL — ABNORMAL LOW (ref 13.0–17.0)
MCH: 28.4 pg (ref 26.0–34.0)
MCHC: 32 g/dL (ref 30.0–36.0)
MCV: 88.7 fL (ref 80.0–100.0)
Platelets: 163 10*3/uL (ref 150–400)
RBC: 3.91 MIL/uL — ABNORMAL LOW (ref 4.22–5.81)
RDW: 15.9 % — ABNORMAL HIGH (ref 11.5–15.5)
WBC: 44.8 10*3/uL — ABNORMAL HIGH (ref 4.0–10.5)
nRBC: 0 % (ref 0.0–0.2)

## 2023-07-20 LAB — BASIC METABOLIC PANEL
Anion gap: 18 — ABNORMAL HIGH (ref 5–15)
BUN: 47 mg/dL — ABNORMAL HIGH (ref 6–20)
CO2: 22 mmol/L (ref 22–32)
Calcium: 7.7 mg/dL — ABNORMAL LOW (ref 8.9–10.3)
Chloride: 91 mmol/L — ABNORMAL LOW (ref 98–111)
Creatinine, Ser: 10.96 mg/dL — ABNORMAL HIGH (ref 0.61–1.24)
GFR, Estimated: 5 mL/min — ABNORMAL LOW (ref >60)
Glucose, Bld: 167 mg/dL — ABNORMAL HIGH (ref 70–99)
Potassium: 4.4 mmol/L (ref 3.5–5.1)
Sodium: 131 mmol/L — ABNORMAL LOW (ref 135–145)

## 2023-07-20 LAB — MAGNESIUM: Magnesium: 2.6 mg/dL — ABNORMAL HIGH (ref 1.7–2.4)

## 2023-07-20 LAB — GLUCOSE, CAPILLARY
Glucose-Capillary: 125 mg/dL — ABNORMAL HIGH (ref 70–99)
Glucose-Capillary: 154 mg/dL — ABNORMAL HIGH (ref 70–99)
Glucose-Capillary: 154 mg/dL — ABNORMAL HIGH (ref 70–99)
Glucose-Capillary: 177 mg/dL — ABNORMAL HIGH (ref 70–99)
Glucose-Capillary: 218 mg/dL — ABNORMAL HIGH (ref 70–99)
Glucose-Capillary: 262 mg/dL — ABNORMAL HIGH (ref 70–99)
Glucose-Capillary: 287 mg/dL — ABNORMAL HIGH (ref 70–99)

## 2023-07-20 LAB — LACTIC ACID, PLASMA: Lactic Acid, Venous: 2.7 mmol/L (ref 0.5–1.9)

## 2023-07-20 LAB — CULTURE, BLOOD (ROUTINE X 2)
Culture: NO GROWTH
Culture: NO GROWTH
Special Requests: ADEQUATE
Special Requests: ADEQUATE

## 2023-07-20 LAB — ECHO TEE
Est EF: 35
MV M vel: 6.82 m/s
MV Peak grad: 186 mm[Hg]
Radius: 1 cm

## 2023-07-20 LAB — RENAL FUNCTION PANEL
Albumin: 1.7 g/dL — ABNORMAL LOW (ref 3.5–5.0)
Anion gap: 15 (ref 5–15)
BUN: 50 mg/dL — ABNORMAL HIGH (ref 6–20)
CO2: 20 mmol/L — ABNORMAL LOW (ref 22–32)
Calcium: 6.9 mg/dL — ABNORMAL LOW (ref 8.9–10.3)
Chloride: 95 mmol/L — ABNORMAL LOW (ref 98–111)
Creatinine, Ser: 10.24 mg/dL — ABNORMAL HIGH (ref 0.61–1.24)
GFR, Estimated: 6 mL/min — ABNORMAL LOW (ref >60)
Glucose, Bld: 262 mg/dL — ABNORMAL HIGH (ref 70–99)
Phosphorus: 5.1 mg/dL — ABNORMAL HIGH (ref 2.5–4.6)
Potassium: 4 mmol/L (ref 3.5–5.1)
Sodium: 130 mmol/L — ABNORMAL LOW (ref 135–145)

## 2023-07-20 LAB — HEPATITIS B SURFACE ANTIBODY, QUANTITATIVE: Hep B S AB Quant (Post): 69.7 m[IU]/mL

## 2023-07-20 LAB — PHOSPHORUS: Phosphorus: 5.5 mg/dL — ABNORMAL HIGH (ref 2.5–4.6)

## 2023-07-20 MED ORDER — PRISMASOL BGK 0/2.5 32-2.5 MEQ/L EC SOLN
Status: DC
  Filled 2023-07-20 (×14): qty 5000

## 2023-07-20 MED ORDER — MIDAZOLAM HCL 2 MG/2ML IJ SOLN
2.0000 mg | Freq: Once | INTRAMUSCULAR | Status: AC
  Administered 2023-07-20: 2 mg via INTRAVENOUS

## 2023-07-20 MED ORDER — PRISMASOL BGK 0/2.5 32-2.5 MEQ/L EC SOLN
Status: DC
  Filled 2023-07-20 (×15): qty 5000

## 2023-07-20 MED ORDER — PRISMASOL BGK 4/2.5 32-4-2.5 MEQ/L EC SOLN: Status: DC

## 2023-07-20 MED ORDER — SODIUM CHLORIDE 0.9 % IV SOLN
2.0000 g | Freq: Two times a day (BID) | INTRAVENOUS | Status: DC
  Administered 2023-07-20 – 2023-07-21 (×2): 2 g via INTRAVENOUS
  Filled 2023-07-20 (×2): qty 12.5

## 2023-07-20 MED ORDER — HEPARIN SODIUM (PORCINE) 1000 UNIT/ML DIALYSIS
1000.0000 [IU] | INTRAMUSCULAR | Status: DC | PRN
  Administered 2023-07-20: 5000 [IU] via INTRAVENOUS_CENTRAL
  Filled 2023-07-20 (×2): qty 5
  Filled 2023-07-20: qty 6
  Filled 2023-07-20: qty 5

## 2023-07-20 MED ORDER — VANCOMYCIN HCL 750 MG/150ML IV SOLN
750.0000 mg | INTRAVENOUS | Status: DC
Start: 2023-07-21 — End: 2023-07-27
  Administered 2023-07-21 – 2023-07-26 (×6): 750 mg via INTRAVENOUS
  Filled 2023-07-20 (×8): qty 150

## 2023-07-20 NOTE — Procedures (Signed)
Arterial Catheter Insertion Procedure Note  [name]  [id number]  1981/07/31  Date:07/20/23  Time:1:30 PM    Provider Performing: Rutherford Guys    Procedure: Insertion of Arterial Line (40981) with US guidance (19147)   Indication(s) Blood pressure monitoring and/or need for frequent ABGs  Consent Risks of the procedure as well as the alternatives and risks of each were explained to the patient and/or caregiver.  Consent for the procedure was obtained and is signed in the bedside chart  Anesthesia None   Time Out Verified patient identification, verified procedure, site/side was marked, verified correct patient position, special equipment/implants available, medications/allergies/relevant history reviewed, required imaging and test results available.   Sterile Technique Maximal sterile technique including full sterile barrier drape, hand hygiene, sterile gown, sterile gloves, mask, hair covering, sterile ultrasound probe cover (if used).   Procedure Description Area of catheter insertion was cleaned with chlorhexidine and draped in sterile fashion. With real-time ultrasound guidance an arterial catheter was placed into the right femoral artery.  Appropriate arterial tracings confirmed on monitor.     Complications/Tolerance None; patient tolerated the procedure well.   EBL Minimal   Specimen(s) None   Rutherford Guys, PA - [facility] For pager details, please see AMION or use Epic chat  After 1900, please call ELINK for cross coverage needs 07/20/2023, 1:30 PM

## 2023-07-20 NOTE — Evaluation (Signed)
Physical Therapy Evaluation Patient Details Name: [name] MRN: [id number] DOB: 03-06-1981 Today's Date: 07/20/2023  History of Present Illness  Patient is a 42 y/o male admitted 07/09/23 with confusion found to be in septic shock found to have MRSA in blood cultures, in DKA, TEE positive for vegitation on multiple valves, MRI showing R MCA infarct.  Pt went into resp distress on 10/21 resulting in intubation and transfer to 2H. PMH positive for ESRD on HD, IDDM, gastroparesis, L AKA, HTN.   Clinical Impression  Pt transferred to ICU yesterday due to respiratory distress and need for intubation. Pt currently sedated, intubated, and on pressors with plans to be started on CRRT later today. RN lifted sedation and pt did start responding. Pt opened eyes to name, followed majority of simple commands with R UE and LE with increased time but remains lethargic and was unable to tolerate movement to EOB but did well in chair position x 10 min. Pt with noted L inattention and inability to cross midline when tracking with eyes R to L. Pt with L UE flaccidity and no response to noxious stimuli. Acute PT to cont to follow. Continue to feel inpatient rehab program < 3hrs a day to be appropriate post hospital stay.        If plan is discharge home, recommend the following: Two people to help with walking and/or transfers;Assist for transportation;Supervision due to cognitive status;Two people to help with bathing/dressing/bathroom;Help with stairs or ramp for entrance   Can travel by private vehicle   No    Equipment Recommendations Other (comment) (TBA)  Recommendations for Other Services       Functional Status Assessment       Precautions / Restrictions Precautions Precautions: Fall Precaution Comments: L groin HD access, LAKA, L hemiparesis, now intubated Restrictions Weight Bearing Restrictions: No LLE Weight Bearing: Non weight bearing      Mobility  Bed Mobility                General bed mobility comments: pt placed in chair position in bed, limited to egress option due to lethargy and being on the vent    Transfers                   General transfer comment: unable    Ambulation/Gait               General Gait Details: unable  Stairs            Wheelchair Mobility     Tilt Bed    Modified Rankin (Stroke Patients Only) Modified Rankin (Stroke Patients Only) Pre-Morbid Rankin Score: Moderate disability Modified Rankin: Severe disability     Balance                                             Pertinent Vitals/Pain Pain Assessment Pain Assessment: Faces Faces Pain Scale: No hurt    Home Living                          Prior Function                       Extremity/Trunk Assessment                Communication      Cognition Arousal: Lethargic  Behavior During Therapy: Flat affect Overall Cognitive Status: Impaired/Different from baseline Area of Impairment: Following commands, Problem solving, Safety/judgement, Attention, Awareness, Orientation                 Orientation Level:  (re-oriented pt to being in ICU and on vent and date) Current Attention Level: Sustained   Following Commands: Follows one step commands with increased time, Follows one step commands inconsistently (no command follow with L UE) Safety/Judgement: Decreased awareness of deficits, Decreased awareness of safety (L inattention)   Problem Solving: Slow processing, Decreased initiation, Difficulty sequencing, Requires verbal cues, Requires tactile cues General Comments: RN lifted sedation as pt was not responding at all, pt immeadiately opened eyes to name once sedation lifted. Pt responded to majority of simple commands with R UE and LE with increased time, pt still sleepy and easily looses focus/closes eyes        General Comments General comments (skin integrity, edema, etc.): Pt with  open ulcer on 4th toe on foot, extremities are cold ot touch, VSS on vent    Exercises     Assessment/Plan    PT Assessment    PT Problem List         PT Treatment Interventions      PT Goals (Current goals can be found in the Care Plan section)  Acute Rehab PT Goals PT Goal Formulation: Patient unable to participate in goal setting Time For Goal Achievement: 07/30/23 Potential to Achieve Goals: Fair    Frequency Min 1X/week     Co-evaluation               AM-PAC PT "6 Clicks" Mobility  Outcome Measure Help needed turning from your back to your side while in a flat bed without using bedrails?: Total Help needed moving from lying on your back to sitting on the side of a flat bed without using bedrails?: Total Help needed moving to and from a bed to a chair (including a wheelchair)?: Total Help needed standing up from a chair using your arms (e.g., wheelchair or bedside chair)?: Total Help needed to walk in hospital room?: Total Help needed climbing 3-5 steps with a railing? : Total 6 Click Score: 6    End of Session Equipment Utilized During Treatment: Oxygen (via vent) Activity Tolerance: Patient limited by lethargy Patient left: in bed;with call bell/phone within reach;with bed alarm set;with restraints reapplied;with nursing/sitter in room Nurse Communication:  (RN present during RE due to h/o becoming agitated when sedation lifted) PT Visit Diagnosis: Muscle weakness (generalized) (M62.81);Other abnormalities of gait and mobility (R26.89);Other symptoms and signs involving the nervous system (R29.898)    Time: 1610-9604 PT Time Calculation (min) (ACUTE ONLY): 18 min   Charges:   PT Evaluation $PT Re-evaluation: 1 Re-eval   PT General Charges $$ ACUTE PT VISIT: 1 Visit         Lewis Shock, PT, DPT Acute Rehabilitation Services Secure chat preferred Office #: [phone]   Iona Hansen 07/20/2023, 9:36 AM

## 2023-07-20 NOTE — Progress Notes (Signed)
   Patient Name: RENDALL FUCILE Date of Encounter: 07/20/2023 [facility] Cardiologist: None   Interval Summary  .    Alert, following commands, making progress towards extubation, possibly later today. Severely oliguric, plan to start CRRT today.  Vital Signs .    Vitals:   07/20/23 0715 07/20/23 0745 07/20/23 0800 07/20/23 0820  BP: (!) 113/57 (!) 163/58 (!) 84/73   Pulse: 79  85 (!) 115  Resp: (!) 48 20 20 16   Temp:   98.4 F (36.9 C)   TempSrc:   Oral   SpO2: 99%  92% 96%  Weight:      Height:        Intake/Output Summary (Last 24 hours) at 07/20/2023 0933 Last data filed at 07/20/2023 0600 Gross per 24 hour  Intake 2182.75 ml  Output 125 ml  Net 2057.75 ml      07/20/2023    4:00 AM 07/19/2023    4:50 AM 07/18/2023    5:00 AM  Last 3 Weights  Weight (lbs) 154 lb 12.2 oz 145 lb 4.5 oz 161 lb 13.1 oz  Weight (kg) 70.2 kg 65.9 kg 73.4 kg      Telemetry/ECG    NSR/sinus tachycardia- Personally Reviewed  Physical Exam .   GEN: No acute distress.  Intubated, but alert Neck: No JVD Cardiac: RRR, no murmurs, rubs, or gallops.  Respiratory: Clear to auscultation bilaterally. GI: Soft, nontender, non-distended  MS: No edema  Assessment & Plan .     Since there is a possibility he will be extubated later today, will perform the transesophageal echocardiogram first.  Obtain consent telephonically from his mother, Mrs. Jatavian Konopka.  Informed Consent   Shared Decision Making/Informed Consent The risks [esophageal damage, perforation (1:10,000 risk), bleeding, pharyngeal hematoma as well as other potential complications associated with conscious sedation including aspiration, arrhythmia, respiratory failure and death], benefits (treatment guidance and diagnostic support) and alternatives of a transesophageal echocardiogram were discussed in detail with patient's mother and she is willing to proceed.      For questions or updates, please contact [facility] Please consult [email or web address] for contact info under        Signed, Thurmon Fair, MD

## 2023-07-20 NOTE — Procedures (Signed)
INDICATIONS: infective endocarditis  PROCEDURE:   Informed consent was obtained prior to the procedure. The risks, benefits and alternatives for the procedure were discussed and the patient's mother comprehended these risks.  Risks include, but are not limited to, cough, sore throat, vomiting, nausea, somnolence, esophageal and stomach trauma or perforation, bleeding, low blood pressure, aspiration, pneumonia, infection, trauma to the teeth and death.    During this procedure the patient was administered a total of Versed 2 mg and Fentanyl 50 mcg to achieve and maintain moderate conscious sedation.  The patient's heart rate, blood pressure, and oxygen saturation were monitored continuously during the procedure. The period of conscious sedation was 20 minutes, of which I was present face-to-face 100% of this time. The patient was already endotracheally intubated/mechanically ventilated.  The transesophageal probe was inserted in the esophagus and stomach without difficulty and multiple views were obtained.    Agitated microbubble saline contrast was not administered.  COMPLICATIONS:    There were no immediate complications.  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 The vegetation is highly mobile. 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 The tricuspid valve leaflets are less well visualized, but there are no large vegetations seen. There are degenerative changes of the trileaflet aortic valve, without vegetations, with trivial AI, no stenosis. Moderately decreased LVEF around 35-40% with global hypokinesis.   RECOMMENDATIONS:    Re-consult CV surgery for progression of disease with very bulky vegetation and now  hemodynamically significant MR.  Time Spent Directly with the Patient:  30 minutes   [name] 07/20/2023, 1:37 PM

## 2023-07-20 NOTE — Progress Notes (Signed)
Pharmacy Antibiotic Note  [name] is a 42 y.o. male admitted on 07/09/2023 with sepsis, found down. MRSA bacteremia/endocarditis and PNA. ESRD-was on HD, last session on 10/18 and pt tolerated about 3 hours. Now on CRRT to see if this will help expedite liberation from the vent and due to pressor requirements.  WBC were significantly increased yesterday at 73.5, have come down to 44.8, tmax 101.5.  Of note, CRRT begun ~1030 and ran for about 2 hours before being stopped for beside echo. Plan is to restart soon. Will adjust antibiotics accordingly due to new CRRT.    Plan: Vancomycin 750 mg every 24 hours to begin ~ 12 hours after CRRT reinitiation Cefepime 2 g every 12 hours to begin tonight at 2200  Trend cultures, WBC, and temperature F/u RRT plan and adjust antibiotics accordingly  Height: 5\' 7"  (170.2 cm) Weight: 70.2 kg (154 lb 12.2 oz) IBW/kg (Calculated) : 66.1  Temp (24hrs), Avg:98.1 F (36.7 C), Min:97.2 F (36.2 C), Max:98.7 F (37.1 C)  Recent Labs  Lab 07/15/23 0609 07/15/23 1500 07/16/23 1835 07/19/23 0434 07/19/23 0958 07/20/23 0349 07/20/23 0610  WBC 13.7*  --  14.9* ACCURACY OF RESULTS QUESTIONABLE. RECOMMEND RECOLLECT TO VERIFY. 73.5* 44.8*  --   CREATININE 8.54*  --  10.04* 9.46* 10.00*  --  10.96*  LATICACIDVEN  --   --   --  2.5* 2.7*  --   --   VANCORANDOM  --  25  --   --   --   --   --     Estimated Creatinine Clearance: 8.1 mL/min (A) (by C-G formula based on SCr of 10.96 mg/dL (H)).    No Known Allergies  Microbiology results: 10/12 covid negative 10/12 BCx: MRSA 10/12 MRSA PCR: positive 10/12 Cdiff PCR positive, toxin negative 10/13 Bcx: ngtdF 10/17 Bcx: ngtF 10/21 TA: few Gps and GPRs on gram stain, culture reincubated for better growth'   Thank you for involving pharmacy in the patient's care.   Theotis Burrow, PharmD PGY1 Acute Care Pharmacy Resident  07/20/2023 12:43 PM

## 2023-07-20 NOTE — Progress Notes (Signed)
NAME:  [name], MRN:  [id number], DOB:  06/26/1981, LOS: 10 ADMISSION DATE:  07/09/2023, CONSULTATION DATE: 07/10/2023 REFERRING MD:  Dr Suezanne Jacquet, CHIEF COMPLAINT: DKA, altered mental status  History of Present Illness:  Patient is a 42 yo M w/ pertinent PMH ESRD on HD MWF, IDDM, gastroparesis, left AKA, HTN presents to [facility] on 10/11 w/ AMS.   Patient recently admitted to [facility] on 7/12 sepsis from right diabetic foot ulcer. Cultures w/ MRSA. Patient had dialysis catheter removed and replaced. ID treated w/ vanc. Patient refused echo and left on 7/17 against medical advise.    On 10/11 patient was found by family member on ground altered. EMS arrived and dialysis catheter partially removed. Patient more alert for them. Has been complaining of diarrhea x1 week per family. Unsure if patient went to dialysis today. Transferred to [facility] ED. On arrival bp stable 107/70 and afebrile. Patient remains confused and occasionally with agitation. Given dilaudid. CBG 497. Beta H >8. VBG ph 7.06, 16, 54, 4.7. Given iv fluids and started on insulin drip for DKA. CT head negative for acute abnormality. Cxr no significant findings. CT abd/pelvis no acute findings; possible steatosis vs. Liver mass 7 x 4 cm recommending f/u w/ MRI. WBC 20.7 and LA 2.4. Sepsis protocol initiated. Cultures obtained and started on vanc/zosyn. Given ams, dka, and soft bp pccm consulted for icu admission.  10/21 pccm called overnight to assess for worsening hypoxia and AMS after vomiting. On NRB w/ sats 70-80s. Hypoglycemic given dextrose which improved his mental status some. Transferred to 2H icu for intubation.   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 Discussed with cardiology, plan to repeat TEE today at 1300 then can repeat SBT and plan for extubation if he does well.  Objective   Blood pressure (!) 84/73, pulse (!) 115, temperature 98.4 F (36.9 C), temperature source Oral, resp. rate 16, height 5\' 7"  (1.702 m), weight 70.2 kg, SpO2 96%.    Vent Mode: CPAP;PSV FiO2 (%):  [30 %-40 %] 30 % Set Rate:  [18 bmp] 18 bmp Vt Set:  [520 mL] 520 mL PEEP:  [5 cmH20-8 cmH20] 5 cmH20 Pressure Support:  [5 cmH20] 5 cmH20 Plateau Pressure:  [22 cmH20-25 cmH20] 23 cmH20   Intake/Output Summary (Last 24 hours) at 07/20/2023 0901 Last data filed at 07/20/2023 0600 Gross per 24 hour  Intake 2182.75 ml  Output 125 ml  Net 2057.75 ml   Filed Weights   07/18/23 0500 07/19/23 0450 07/20/23 0400  Weight: 73.4 kg 65.9 kg 70.2 kg    Examination: General:  critically ill appearing M, in NAD HEENT: MM pink/moist, ETT in place Neuro: Sedated on vent but does awaken to voice and follow some simple commands CV: RRR, 2/6 SEM PULM:  coarse bilaterally GI: soft, bs dim Extremities: warm/dry, left aka w/ left femoral tunneled HD cath in place  Skin: Old healed lesions noted to RLE. L AKA.    Assessment & Plan:   Acute respiratory failure w/ hypoxia - presumed aspiration. S/p intubation overnight 10/21 Possible aspiration Plan: -Continue SBT but hold off on extubation as cards planning for repeat TEE today -Continue Cefepime, Vanc -Follow cultures, prelim 10/21 with GPCs and GPRs -CXR intermittently  Septic shock MRSA bacteremia dating back to  July 2024 Plan: -cont vanc and cefepime for possible pna -follow trach aspirate (few GPCs and GPRs) -wean levo for map goal >65 -ID following; appreciate recs  Infective endocarditis Plan: - Continue abx as above - TCTS following; no plan for surgery for now; plan to cont 6 weeks antibiotics - Cards planning for repeat TEE today 10/22 at 1300  Acute metabolic encephalopathy Acute ischemic stroke/septic infarct  10/17- neuro has signed off and recommended outpatient follow up Plan: -limit sedating meds -cont abx as above -neuro OP follow up  Combined systolic/diastolic chf Plan: -GDMT when appropriate -HD for volume removal though CRRT planned 10/22 for 24 - 48 hours  DKA - resolved Hypoglycemia - resolved Plan: -ssi and cbg monitoring -hold basal insulin   End-stage renal disease on hemodialysis Hyponatremia Plan: -nephro following for HD. Planning for CRRT 10/22 for 24 - 48 hours - Volume removal per CRRT -Trend BMP / urinary output -Replace electrolytes as indicated -Avoid nephrotoxic agents, ensure adequate renal perfusion  Anemia/thrombocytopenia Plan: -trend cbc  Incidental finding of Liver steatosis vs. Liver mass -CT abd/pelvis showing 7 x 4 cm density in anterior liver  Plan: -f/u outpt w/ MRI  Best Practice (right click and "Reselect all SmartList Selections" daily)   Diet/type: TFs DVT prophylaxis: prophylactic heparin  GI prophylaxis: H2B Lines: Dialysis Catheter Foley:  N/A Code Status:  full code Last date of multidisciplinary goals of care discussion: discussed with mother over phone PM 10/21. Dr. Landry Mellow did discuss TEE with her 10/22 and consent obtained to proceed at 1300.   CC time: 35 min.   Rutherford Guys, PA - C [facility] For pager details, please see AMION or use Epic chat  After 1900, please call [facility] for cross coverage needs 07/20/2023, 9:01 AM

## 2023-07-20 NOTE — Progress Notes (Signed)
[facility] NEPHROLOGY PROGRESS NOTE  Assessment/ Plan: AMS: MRI with acute stroke, neurology is following.  Sepsis - hx of MRSA bacteremia in 03/2023. On vanc. BCx's + for MRSA 10/12, 10/13 NGTD at 5 days. TEE 10/16 with vegetations - CT surgery requests repeat TEE 2 wks. he had a catheter holiday and then femoral TDC was placed by IR on 10/18.  AHRF -possibly secondary to aspiration. Intubated 10/21 -per PCCM -starting CRRT to see if this will help expedite liberation from vent  Shock -pressor support per primary serivce  ESRD -typically on HD MWF.  Given that he remains on pressors, will start CRRT today. Pre and post 2K, dialysate 4K.  UF goals: Net neg 50-167mL/hour  Dialysis access - Per IR note, occluded both right and left subclavian on venography therefore successful placement of left femoral TDC with the tip of the catheter in the infrarenal IVC.  HTN/volume - UF as tolerated. On pressors as above. 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 Transfuse PRN for hgb <7. 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 Resume sensipar when taking PO. Last PO4 acceptable   OP HD: MWF [facility] 4h  400/1.5    77kg   2/3 bath  [facility] TDC    Heparin none - last OP HD 10/7, post wt 74.6kg - coming off 75- 76kg - misses HD once every other week approx - rocaltrol 1.50 mcg  - no esa  Discussed with primary service and ICU RN.  Anthony Sar, MD [facility]  Subjective:  Seen and examined at bedside in ICU.  No acute events overnight.  Remains on Levophed.  No robust improvement in mental status despite weaning sedation  Objective Vital signs in last 24 hours: Vitals:   07/20/23 0700 07/20/23 0715 07/20/23 0745 07/20/23 0800  BP: (!) 119/32 (!) 113/57 (!) 163/58 (!) 84/73  Pulse:  79  85  Resp: (!) 44 (!) 48 20 20  Temp:      TempSrc:      SpO2:  99%  92%  Weight:      Height:       Weight  change: 4.3 kg  Intake/Output Summary (Last 24 hours) at 07/20/2023 0817 Last data filed at 07/20/2023 0600 Gross per 24 hour  Intake 2347.45 ml  Output 125 ml  Net 2222.45 ml       Labs: RENAL PANEL Recent Labs  Lab 07/15/23 0609 07/16/23 1835 07/19/23 0207 07/19/23 0434 07/19/23 0958 07/20/23 0610  NA 131* 131* 131* 134* 132* 131*  K 4.1 3.5 3.6 4.1 4.3 4.4  CL 89* 91*  --  92* 94* 91*  CO2 14* 21*  --  22 21* 22  GLUCOSE 332* 223*  --  106* 160* 167*  BUN 49* 61*  --  39* 41* 47*  CREATININE 8.54* 10.04*  --  9.46* 10.00* 10.96*  CALCIUM 8.3* 7.9*  --  8.6* 7.5* 7.7*  MG  --   --   --  2.7*  --  2.6*  PHOS 5.2* 3.7  --  4.7*  --  5.5*  ALBUMIN  --  2.0*  --   --  2.2*  --     Liver Function Tests: Recent Labs  Lab 07/16/23 1835 07/19/23 0958  AST  --  22  ALT  --  7  ALKPHOS  --  169*  BILITOT  --  1.3*  PROT  --  7.4  ALBUMIN 2.0* 2.2*   No results for input(s): "LIPASE", "AMYLASE" in the last 168 hours. No results for input(s): "AMMONIA" in the last 168 hours.  CBC: Recent Labs    07/16/23 1835 07/19/23 0207 07/19/23 0434 07/19/23 0958 07/20/23 0349  HGB 8.9* 11.6* ACCURACY OF RESULTS QUESTIONABLE. RECOMMEND RECOLLECT TO VERIFY. 10.2* 11.1*  MCV 85.3  --  ACCURACY OF RESULTS QUESTIONABLE. RECOMMEND RECOLLECT TO VERIFY. 89.0 88.7    Cardiac Enzymes: No results for input(s): "CKTOTAL", "CKMB", "CKMBINDEX", "TROPONINI" in the last 168 hours. CBG: Recent Labs  Lab 07/19/23 2022 07/19/23 2302 07/20/23 0211 07/20/23 0401 07/20/23 0804  GLUCAP 129* 89 125* 154* 154*    Iron Studies: No results for input(s): "IRON", "TIBC", "TRANSFERRIN", "FERRITIN" in the last 72 hours. Studies/Results: DG Chest Port 1 View  Result Date: 07/19/2023 CLINICAL DATA:  Endotracheal tube placement EXAM: PORTABLE CHEST 1 VIEW COMPARISON:  07/18/2023 FINDINGS: An endotracheal tube has been placed with tip measuring 6.3 cm above the carina. An enteric tube was  placed with tip projecting over the left upper quadrant consistent with location in the body of the stomach. Cardiac enlargement. Patchy perihilar infiltration mostly on the left. No pleural effusions. No pneumothorax. Mediastinal contours appear intact. Vascular graft in the left axilla and right subclavian region. IMPRESSION: Appliances appear in satisfactory position. Cardiac enlargement. Patchy perihilar infiltrates, greater on the left. Electronically Signed   By: Burman Nieves M.D.   On: 07/19/2023 01:14   DG Chest Port 1 View  Result Date: 07/18/2023 CLINICAL DATA:  Acute respiratory distress.  Left-sided stroke. EXAM: PORTABLE CHEST 1 VIEW COMPARISON:  07/10/2023 FINDINGS: Left central venous catheter has been removed in the interval. Vascular grafts in the axilla and right subclavian region. Shallow inspiration. Mild cardiac enlargement. Patchy infiltrates in the left mid lung and right lung base, possibly atelectasis or pneumonia. No pleural effusions. No pneumothorax. IMPRESSION: Patchy infiltrates in the left mid lung and right lung base, possibly atelectasis or pneumonia. Cardiac enlargement. Electronically Signed   By: Burman Nieves M.D.   On: 07/18/2023 22:02    Medications: Infusions:  ceFEPime (MAXIPIME) IV Stopped (07/19/23 2316)   dexmedetomidine (PRECEDEX) IV infusion 0.6 mcg/kg/hr (07/20/23 0600)   feeding supplement (VITAL 1.5 CAL) 40 mL/hr at 07/20/23 0600   fentaNYL infusion INTRAVENOUS 100 mcg/hr (07/20/23 0600)   norepinephrine (LEVOPHED) Adult infusion 7 mcg/min (07/20/23 0600)   [START ON 07/21/2023] vancomycin      Scheduled Medications:  calcitRIOL  1.5 mcg Per Tube Q M,W,F   Chlorhexidine Gluconate Cloth  6 each Topical Q0600   darbepoetin (ARANESP) injection - DIALYSIS  40 mcg Subcutaneous Q Fri-1800   docusate  100 mg Per Tube BID   famotidine  10 mg Per Tube Daily   feeding supplement (PROSource TF20)  60 mL Per Tube BID   gabapentin  100 mg Per Tube  Q8H   heparin injection (subcutaneous)  5,000 Units Subcutaneous Q8H   heparin sodium (porcine)  4,600 Units Intracatheter Once   insulin aspart  0-6 Units Subcutaneous Q4H   midazolam  2 mg Intravenous Once   multivitamin with minerals  1 tablet Per Tube QHS   mouth rinse  15 mL Mouth Rinse Q2H   polyethylene glycol  17 g Per Tube Daily   sevelamer carbonate  2,400 mg Per Tube TID WC    have reviewed scheduled and prn medications.  Physical Exam: General: ill appearing, sedated/intubated Heart:RRR Lungs: intubated, b/l chest expansion  Abdomen:soft, Non-tender, non-distended Extremities:No edema, left aka Neuro: sedated Dialysis Access: left femoral TDC  Lateasha Breuer 07/20/2023,8:17 AM  LOS: 10 days

## 2023-07-20 NOTE — Procedures (Signed)
Central Venous Catheter Insertion Procedure Note  [name]  [id number]  02-18-1981  Date:07/20/23  Time:1:29 PM   Provider Performing:Dayana Dalporto Celine Mans   Procedure: Insertion of Non-tunneled Central Venous [phone]) with US guidance ([id number])   Indication(s) Medication administration and Difficult access  Consent Risks of the procedure as well as the alternatives and risks of each were explained to the patient and/or caregiver.  Consent for the procedure was obtained and is signed in the bedside chart  Anesthesia Topical only with 1% lidocaine   Timeout Verified patient identification, verified procedure, site/side was marked, verified correct patient position, special equipment/implants available, medications/allergies/relevant history reviewed, required imaging and test results available.  Sterile Technique Maximal sterile technique including full sterile barrier drape, hand hygiene, sterile gown, sterile gloves, mask, hair covering, sterile ultrasound probe cover (if used).  Procedure Description Area of catheter insertion was cleaned with chlorhexidine and draped in sterile fashion.  With real-time ultrasound guidance a central venous catheter was placed into the right femoral vein. Nonpulsatile blood flow and easy flushing noted in all ports.  The catheter was sutured in place and sterile dressing applied.  Complications/Tolerance None; patient tolerated the procedure well. Chest X-ray is ordered to verify placement for internal jugular or subclavian cannulation.   Chest x-ray is not ordered for femoral cannulation.  EBL Minimal  Specimen(s) None   Rutherford Guys, PA - [facility] For pager details, please see AMION or use Epic chat  After 1900, please call [facility] for cross coverage needs 07/20/2023, 1:30 PM

## 2023-07-20 NOTE — Plan of Care (Signed)
  Problem: Nutrition: Goal: Dietary intake will improve Outcome: Progressing   Problem: Nutrition: Goal: Risk of aspiration will decrease Outcome: Not Progressing

## 2023-07-20 NOTE — Progress Notes (Signed)
Patient weaning on vent since 8:20 and levo at 9 mcg/min.  CRRT started around 11 and ran smoothly for about 45 min.  Then CRRT started alarming high return pressures and vent alarmed no patient effort.  Myself and a couple other nurses could not find any reason why there was high return pressures and vent was flipped back over to full support around 12.  He also went hypotensive into the 60s per the cuff.  Dr. Denese Killings decided to stop CRRT ( we were able to return the blood) and place a ft femoral Aline and a rt femoral triple lumen central line.  After this we were able to get him more stable and Dr. Royann Shivers was able to perform a TEE at bedside.  After the TEE, Dr. Denese Killings decided to keep him on full support with the vent and restart dialysis. I restarted CRRT around 1600. Pt. Aunt is at bedside and has been updated by Dr. Royann Shivers.

## 2023-07-21 ENCOUNTER — Inpatient Hospital Stay (HOSPITAL_COMMUNITY): Payer: Medicare HMO

## 2023-07-21 DIAGNOSIS — E1022 Type 1 diabetes mellitus with diabetic chronic kidney disease: Secondary | ICD-10-CM | POA: Diagnosis not present

## 2023-07-21 DIAGNOSIS — I059 Rheumatic mitral valve disease, unspecified: Secondary | ICD-10-CM | POA: Diagnosis not present

## 2023-07-21 DIAGNOSIS — N186 End stage renal disease: Secondary | ICD-10-CM | POA: Diagnosis not present

## 2023-07-21 DIAGNOSIS — A4102 Sepsis due to Methicillin resistant Staphylococcus aureus: Secondary | ICD-10-CM | POA: Diagnosis not present

## 2023-07-21 DIAGNOSIS — Z89612 Acquired absence of left leg above knee: Secondary | ICD-10-CM

## 2023-07-21 DIAGNOSIS — I340 Nonrheumatic mitral (valve) insufficiency: Secondary | ICD-10-CM

## 2023-07-21 DIAGNOSIS — J9601 Acute respiratory failure with hypoxia: Secondary | ICD-10-CM | POA: Diagnosis not present

## 2023-07-21 DIAGNOSIS — R7881 Bacteremia: Secondary | ICD-10-CM | POA: Diagnosis not present

## 2023-07-21 DIAGNOSIS — R6521 Severe sepsis with septic shock: Secondary | ICD-10-CM

## 2023-07-21 LAB — CBC
HCT: 26.6 % — ABNORMAL LOW (ref 39.0–52.0)
Hemoglobin: 8.1 g/dL — ABNORMAL LOW (ref 13.0–17.0)
MCH: 27.6 pg (ref 26.0–34.0)
MCHC: 30.5 g/dL (ref 30.0–36.0)
MCV: 90.5 fL (ref 80.0–100.0)
Platelets: 162 10*3/uL (ref 150–400)
RBC: 2.94 MIL/uL — ABNORMAL LOW (ref 4.22–5.81)
RDW: 15.6 % — ABNORMAL HIGH (ref 11.5–15.5)
WBC: 28.8 10*3/uL — ABNORMAL HIGH (ref 4.0–10.5)
nRBC: 0 % (ref 0.0–0.2)

## 2023-07-21 LAB — GLUCOSE, CAPILLARY
Glucose-Capillary: 272 mg/dL — ABNORMAL HIGH (ref 70–99)
Glucose-Capillary: 262 mg/dL — ABNORMAL HIGH (ref 70–99)
Glucose-Capillary: 309 mg/dL — ABNORMAL HIGH (ref 70–99)
Glucose-Capillary: 277 mg/dL — ABNORMAL HIGH (ref 70–99)
Glucose-Capillary: 172 mg/dL — ABNORMAL HIGH (ref 70–99)

## 2023-07-21 LAB — RENAL FUNCTION PANEL
Albumin: 1.6 g/dL — ABNORMAL LOW (ref 3.5–5.0)
Albumin: 1.6 g/dL — ABNORMAL LOW (ref 3.5–5.0)
Anion gap: 10 (ref 5–15)
Anion gap: 9 (ref 5–15)
BUN: 35 mg/dL — ABNORMAL HIGH (ref 6–20)
BUN: 26 mg/dL — ABNORMAL HIGH (ref 6–20)
CO2: 23 mmol/L (ref 22–32)
CO2: 23 mmol/L (ref 22–32)
Calcium: 7.3 mg/dL — ABNORMAL LOW (ref 8.9–10.3)
Calcium: 7.7 mg/dL — ABNORMAL LOW (ref 8.9–10.3)
Chloride: 98 mmol/L (ref 98–111)
Chloride: 101 mmol/L (ref 98–111)
Creatinine, Ser: 6.46 mg/dL — ABNORMAL HIGH (ref 0.61–1.24)
Creatinine, Ser: 4.32 mg/dL — ABNORMAL HIGH (ref 0.61–1.24)
GFR, Estimated: 10 mL/min — ABNORMAL LOW (ref >60)
GFR, Estimated: 17 mL/min — ABNORMAL LOW (ref >60)
Glucose, Bld: 246 mg/dL — ABNORMAL HIGH (ref 70–99)
Glucose, Bld: 239 mg/dL — ABNORMAL HIGH (ref 70–99)
Phosphorus: 3.4 mg/dL (ref 2.5–4.6)
Phosphorus: 2 mg/dL — ABNORMAL LOW (ref 2.5–4.6)
Potassium: 3.7 mmol/L (ref 3.5–5.1)
Potassium: 3.9 mmol/L (ref 3.5–5.1)
Sodium: 131 mmol/L — ABNORMAL LOW (ref 135–145)
Sodium: 133 mmol/L — ABNORMAL LOW (ref 135–145)

## 2023-07-21 LAB — TROPONIN I (HIGH SENSITIVITY)
Troponin I (High Sensitivity): 61 ng/L — ABNORMAL HIGH (ref <18)
Troponin I (High Sensitivity): 55 ng/L — ABNORMAL HIGH (ref <18)

## 2023-07-21 LAB — POCT I-STAT 7, (LYTES, BLD GAS, ICA,H+H)
Acid-base deficit: 2 mmol/L (ref 0.0–2.0)
Bicarbonate: 23.7 mmol/L (ref 20.0–28.0)
Calcium, Ion: 1.05 mmol/L — ABNORMAL LOW (ref 1.15–1.40)
HCT: 29 % — ABNORMAL LOW (ref 39.0–52.0)
Hemoglobin: 9.9 g/dL — ABNORMAL LOW (ref 13.0–17.0)
O2 Saturation: 99 %
Patient temperature: 98.9
Potassium: 3.8 mmol/L (ref 3.5–5.1)
Sodium: 132 mmol/L — ABNORMAL LOW (ref 135–145)
TCO2: 25 mmol/L (ref 22–32)
pCO2 arterial: 42 mm[Hg] (ref 32–48)
pH, Arterial: 7.36 (ref 7.35–7.45)
pO2, Arterial: 159 mm[Hg] — ABNORMAL HIGH (ref 83–108)

## 2023-07-21 LAB — CULTURE, RESPIRATORY W GRAM STAIN: Culture: NORMAL

## 2023-07-21 LAB — CG4 I-STAT (LACTIC ACID): Lactic Acid, Venous: 0.8 mmol/L (ref 0.5–1.9)

## 2023-07-21 LAB — MAGNESIUM: Magnesium: 2.5 mg/dL — ABNORMAL HIGH (ref 1.7–2.4)

## 2023-07-21 MED ORDER — GUAIFENESIN 100 MG/5ML PO LIQD
15.0000 mL | ORAL | Status: DC | PRN
  Administered 2023-07-21 – 2023-07-26 (×10): 15 mL
  Filled 2023-07-21 (×10): qty 15

## 2023-07-21 MED ORDER — BANATROL TF EN LIQD
60.0000 mL | Freq: Two times a day (BID) | ENTERAL | Status: DC
  Administered 2023-07-21 – 2023-08-18 (×51): 60 mL
  Filled 2023-07-21 (×55): qty 60

## 2023-07-21 MED ORDER — CALCIUM GLUCONATE-NACL 1-0.675 GM/50ML-% IV SOLN
1.0000 g | Freq: Once | INTRAVENOUS | Status: AC
  Administered 2023-07-21: 1000 mg via INTRAVENOUS
  Filled 2023-07-21: qty 50

## 2023-07-21 MED ORDER — RENA-VITE PO TABS
1.0000 | ORAL_TABLET | Freq: Two times a day (BID) | ORAL | Status: DC
  Administered 2023-07-21 – 2023-08-03 (×24): 1
  Filled 2023-07-21 (×25): qty 1

## 2023-07-21 MED ORDER — VITAL 1.5 CAL PO LIQD
1000.0000 mL | ORAL | Status: DC
Start: 2023-07-21 — End: 2023-07-22
  Administered 2023-07-21 – 2023-07-22 (×2): 1000 mL
  Filled 2023-07-21: qty 1000

## 2023-07-21 MED ORDER — THIAMINE MONONITRATE 100 MG PO TABS
100.0000 mg | ORAL_TABLET | Freq: Every day | ORAL | Status: AC
  Administered 2023-07-21 – 2023-07-26 (×6): 100 mg
  Filled 2023-07-21 (×6): qty 1

## 2023-07-21 MED ORDER — HEPARIN BOLUS VIA INFUSION (CRRT): 1000.0000 [IU] | INTRAVENOUS | Status: DC | PRN

## 2023-07-21 MED ORDER — ALBUTEROL SULFATE (2.5 MG/3ML) 0.083% IN NEBU
2.5000 mg | INHALATION_SOLUTION | RESPIRATORY_TRACT | Status: DC
  Administered 2023-07-21 – 2023-07-22 (×9): 2.5 mg via RESPIRATORY_TRACT
  Filled 2023-07-21 (×9): qty 3

## 2023-07-21 MED ORDER — HEPARIN SODIUM (PORCINE) 5000 UNIT/ML IJ SOLN
250.0000 [IU]/h | INTRAMUSCULAR | Status: DC
  Administered 2023-07-21: 250 [IU]/h via INTRAVENOUS_CENTRAL
  Administered 2023-07-22: 1300 [IU]/h via INTRAVENOUS_CENTRAL
  Administered 2023-07-22: 1150 [IU]/h via INTRAVENOUS_CENTRAL
  Administered 2023-07-22: 1550 [IU]/h via INTRAVENOUS_CENTRAL
  Administered 2023-07-22: 1650 [IU]/h via INTRAVENOUS_CENTRAL
  Administered 2023-07-23: 1600 [IU]/h via INTRAVENOUS_CENTRAL
  Filled 2023-07-21 (×2): qty 2
  Filled 2023-07-21 (×2): qty 10000
  Filled 2023-07-21 (×3): qty 2

## 2023-07-21 MED ORDER — PROSOURCE TF20 ENFIT COMPATIBL EN LIQD
60.0000 mL | Freq: Three times a day (TID) | ENTERAL | Status: DC
  Administered 2023-07-21 – 2023-07-22 (×2): 60 mL
  Filled 2023-07-21 (×2): qty 60

## 2023-07-21 MED ORDER — INSULIN ASPART 100 UNIT/ML IJ SOLN
0.0000 [IU] | INTRAMUSCULAR | Status: DC
  Administered 2023-07-21 (×2): 5 [IU] via SUBCUTANEOUS
  Administered 2023-07-21: 7 [IU] via SUBCUTANEOUS
  Administered 2023-07-21: 2 [IU] via SUBCUTANEOUS
  Administered 2023-07-22: 5 [IU] via SUBCUTANEOUS
  Administered 2023-07-22: 3 [IU] via SUBCUTANEOUS

## 2023-07-21 NOTE — Progress Notes (Signed)
[address]       [place] [address]             [phone]      7 Days Post-Op  Procedure(s) (LRB): TRANSESOPHAGEAL ECHOCARDIOGRAM (N/A)   Total Length of Stay:  LOS: 11 days    SUBJECTIVE: TEE yesterday with worsening MR and size of vegetation Remains intubated  Vitals:   07/21/23 0645 07/21/23 0700  BP:  (!) 97/28  Pulse: 83 85  Resp: (!) 9 16  Temp:    SpO2: 100% 100%    Intake/Output      10/22 0701 10/23 0700 10/23 0701 10/24 0700   I.V. (mL/kg) 1297.6 (18.2)    NG/GT 1049.5    IV Piggyback 250    Total Intake(mL/kg) 2597 (36.5)    Emesis/NG output 200    CRRT 2150    Total Output 2350    Net +247             ceFEPime (MAXIPIME) IV Stopped (07/20/23 2221)   dexmedetomidine (PRECEDEX) IV infusion Stopped (07/21/23 0548)   feeding supplement (VITAL 1.5 CAL) 60 mL/hr at 07/21/23 0700   fentaNYL infusion INTRAVENOUS 75 mcg/hr (07/21/23 0700)   norepinephrine (LEVOPHED) Adult infusion 8 mcg/min (07/21/23 0700)   prismasol BGK 2/2.5 replacement solution 400 mL/hr at 07/21/23 0359   prismasol BGK 2/2.5 replacement solution 500 mL/hr at 07/21/23 0358   prismasol BGK 4/2.5 1,500 mL/hr at 07/21/23 0536   vancomycin Stopped (07/21/23 0408)    CBC    Component Value Date/Time   WBC 28.8 (H) 07/21/2023 0329   RBC 2.94 (L) 07/21/2023 0329   HGB 9.9 (L) 07/21/2023 0341   HGB 9.7 (L) 08/22/2014 0447   HCT 29.0 (L) 07/21/2023 0341   HCT 30.0 (L) 08/22/2014 0447   PLT 162 07/21/2023 0329   PLT 158 08/22/2014 0447   MCV 90.5 07/21/2023 0329   MCV 97 08/22/2014 0447   MCH 27.6 07/21/2023 0329   MCHC 30.5 07/21/2023 0329   RDW 15.6 (H) 07/21/2023 0329   RDW 15.3 (H) 08/22/2014 0447   LYMPHSABS 0.3 (L) 07/09/2023 2231   LYMPHSABS 1.7 08/22/2014 0447   MONOABS 0.9 07/09/2023 2231   MONOABS 0.8 08/22/2014 0447   EOSABS 0.1 07/09/2023 2231   EOSABS 0.4 08/22/2014 0447   BASOSABS 0.1 07/09/2023 2231   BASOSABS 0.1 08/22/2014 0447    CMP     Component Value Date/Time   NA 132 (L) 07/21/2023 0341   NA 138 08/22/2014 0447   K 3.8 07/21/2023 0341   K 4.0 08/22/2014 0447   CL 98 07/21/2023 0327   CL 100 08/22/2014 0447   CO2 23 07/21/2023 0327   CO2 30 08/22/2014 0447   GLUCOSE 246 (H) 07/21/2023 0327   GLUCOSE 88 08/22/2014 0447   BUN 35 (H) 07/21/2023 0327   BUN 14 08/22/2014 0447   CREATININE 6.46 (H) 07/21/2023 0327   CREATININE 5.00 (H) 08/22/2014 0447   CALCIUM 7.3 (L) 07/21/2023 0327   CALCIUM 8.2 (L) 08/22/2014 0447   PROT 7.4 07/19/2023 0958   PROT 7.2 08/19/2014 2006   ALBUMIN 1.6 (L) 07/21/2023 0327   ALBUMIN 3.8 08/19/2014 2006   AST 22 07/19/2023 0958   AST 14 (L) 08/19/2014 2006   ALT 7 07/19/2023 0958   ALT 20 08/19/2014 2006   ALKPHOS 169 (H) 07/19/2023 0958   ALKPHOS 121 (H) 08/19/2014 2006   BILITOT 1.3 (H) 07/19/2023 0958   BILITOT 0.9  08/19/2014 2006   GFRNONAA 10 (L) 07/21/2023 0327   GFRNONAA 14 (L) 08/22/2014 0447   GFRNONAA 5 (L) 06/19/2014 0937   GFRAA 7 (L) 05/30/2020 1923   GFRAA 17 (L) 08/22/2014 0447   GFRAA 6 (L) 06/19/2014 0937   ABG    Component Value Date/Time   PHART 7.360 07/21/2023 0341   PCO2ART 42.0 07/21/2023 0341   PO2ART 159 (H) 07/21/2023 0341   HCO3 23.7 07/21/2023 0341   TCO2 25 07/21/2023 0341   ACIDBASEDEF 2.0 07/21/2023 0341   O2SAT 99 07/21/2023 0341   CBG (last 3)  Recent Labs    07/20/23 1959 07/20/23 2352 07/21/23 0338  GLUCAP 218* 177* 272*     ASSESSMENT: Mitral Valve endocarditis Discussed with CCM that pt extremely high risk surgical candidate and will defer surgery here to be transferred to higher level care center    Eugenio Hoes, MD 07/21/2023

## 2023-07-21 NOTE — Progress Notes (Signed)
[facility] NEPHROLOGY PROGRESS NOTE  Assessment/ Plan:  MRSA bacteremia with MV endocarditis -s/p line holiday, TDC placed by IR 10/18 -on vanc and cefepime -CTS following-high risk for surgery, may need transfer to higher level of care to consider surgery  Acute ischemic CVA/septic infarct 10/17 -neuro signed off, per primary service  AHRF -possibly secondary to aspiration. Intubated 10/21 -per PCCM -will cont to UF as tolerated with renal replacement therapy  Shock -pressor support per primary serivce  ESRD -typically on HD MWF. -given ongoing shock, started CRRT 10/22. Increase UF goals to 100-150cc/hr as tolerated. Will add heparin through circuit  Dialysis access - Per IR note, occluded both right and left subclavian on venography therefore successful placement of left femoral TDC with the tip of the catheter in the infrarenal IVC.  HTN/volume - UF as tolerated. On pressors as above. 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 Transfuse PRN for hgb <7. 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 Resume sensipar when taking PO. Last PO4 acceptable   OP HD: MWF [facility] 4h  400/1.5    77kg   2/3 bath  [facility] TDC    Heparin none - last OP HD 10/7, post wt 74.6kg - coming off 75- 76kg - misses HD once every other week approx - rocaltrol 1.50 mcg  - no esa  Discussed with ICU RN and patient's mother in room.  Anthony Sar, MD [facility]  Subjective:  Seen and examined in ICU and on CRRT. Discussed with mother in room. Discussed with ICU RN-has been having issues with frequent clotting with CRRT.  Pressors: levo Net pos ~0.2L.  Objective Vital signs in last 24 hours: Vitals:   07/21/23 0615 07/21/23 0630 07/21/23 0645 07/21/23 0700  BP:    (!) 97/28  Pulse:  76 83 85  Resp: 18 18 (!) 9 16  Temp:      TempSrc:      SpO2:  100% 100% 100%  Weight:      Height:       Weight  change: 1 kg  Intake/Output Summary (Last 24 hours) at 07/21/2023 0808 Last data filed at 07/21/2023 0700 Gross per 24 hour  Intake 2419.11 ml  Output 2350 ml  Net 69.11 ml       Labs: RENAL PANEL Recent Labs  Lab 07/16/23 1835 07/19/23 0207 07/19/23 0434 07/19/23 0958 07/20/23 0610 07/20/23 1638 07/21/23 0327 07/21/23 0341  NA 131*   < > 134* 132* 131* 130* 131* 132*  K 3.5   < > 4.1 4.3 4.4 4.0 3.7 3.8  CL 91*  --  92* 94* 91* 95* 98  --   CO2 21*  --  22 21* 22 20* 23  --   GLUCOSE 223*  --  106* 160* 167* 262* 246*  --   BUN 61*  --  39* 41* 47* 50* 35*  --   CREATININE 10.04*  --  9.46* 10.00* 10.96* 10.24* 6.46*  --   CALCIUM 7.9*  --  8.6* 7.5* 7.7* 6.9* 7.3*  --   MG  --   --  2.7*  --  2.6*  --  2.5*  --   PHOS 3.7  --  4.7*  --  5.5* 5.1* 3.4  --   ALBUMIN 2.0*  --   --  2.2*  --  1.7* 1.6*  --    < > =  values in this interval not displayed.    Liver Function Tests: Recent Labs  Lab 07/19/23 0958 07/20/23 1638 07/21/23 0327  AST 22  --   --   ALT 7  --   --   ALKPHOS 169*  --   --   BILITOT 1.3*  --   --   PROT 7.4  --   --   ALBUMIN 2.2* 1.7* 1.6*   No results for input(s): "LIPASE", "AMYLASE" in the last 168 hours. No results for input(s): "AMMONIA" in the last 168 hours.  CBC: Recent Labs    07/19/23 0434 07/19/23 0958 07/20/23 0349 07/21/23 0329 07/21/23 0341  HGB ACCURACY OF RESULTS QUESTIONABLE. RECOMMEND RECOLLECT TO VERIFY. 10.2* 11.1* 8.1* 9.9*  MCV ACCURACY OF RESULTS QUESTIONABLE. RECOMMEND RECOLLECT TO VERIFY. 89.0 88.7 90.5  --     Cardiac Enzymes: No results for input(s): "CKTOTAL", "CKMB", "CKMBINDEX", "TROPONINI" in the last 168 hours. CBG: Recent Labs  Lab 07/20/23 1607 07/20/23 1959 07/20/23 2352 07/21/23 0338 07/21/23 0755  GLUCAP 262* 218* 177* 272* 262*    Iron Studies: No results for input(s): "IRON", "TIBC", "TRANSFERRIN", "FERRITIN" in the last 72 hours. Studies/Results: ECHO TEE  Result Date:  07/20/2023    TRANSESOPHOGEAL ECHO REPORT   Patient Name:   SAGEN AREOLA Date of Exam: 07/20/2023 Medical Rec #:  [id number]     Height:       67.0 in Accession #:    [id number]    Weight:       154.8 lb Date of Birth:  [birth year]    BSA:          1.813 m Patient Age:    42 years      BP:           123/63 mmHg Patient Gender: M             HR:           89 bpm. Exam Location:  Inpatient Procedure: Transesophageal Echo, 3D Echo, Color Doppler and Cardiac Doppler Indications:     Endocarditis  History:         Patient has prior history of Echocardiogram examinations, most                  recent 07/14/2023. CHF; Risk Factors:Hypertension and Diabetes.  Sonographer:     Irving Burton Senior RDCS Referring Phys:  [phone] MIHAI CROITORU Diagnosing Phys: Thurmon Fair MD PROCEDURE: After discussion of the risks and benefits of a TEE, an informed consent was obtained from the patient. The transesophogeal probe was passed without difficulty through the esophogus of the patient. Sedation performed by performing physician. The patient developed no complications during the procedure.  IMPRESSIONS  1. Left ventricular ejection fraction, by estimation, is 35%. The left ventricle has moderately decreased function. The left ventricle demonstrates global hypokinesis. There is mild concentric left ventricular hypertrophy.  2. Right ventricular systolic function is normal. The right ventricular size is normal. There is mildly elevated pulmonary artery systolic pressure.  3. No left atrial/left atrial appendage thrombus was detected.  4. 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 It measures up to 3.9 cm in length and 1 cm in width and is highly mobile, prolapsing deep in the LV in diastole and reaching the posterior  left atrial wall in systole. There is an irregular perforation of the P1 scallop with a diameter of at least 4 mm with moderate -  severe insufficiency. By both  2D and 3D PISA based measurements the effective regurgitant orifice area is 0.35 cm sq, regurgitant volume 55 ml, regurgitant fraction 60%. The mitral valve is abnormal. Moderate to severe mitral valve regurgitation. No evidence of mitral stenosis.  5. Tricuspid valve regurgitation is mild to moderate.  6. The right coronary cusp is disproportionately affected by degenerative changes. No mobile vegetations a re seen. The aortic valve is tricuspid. There is mild calcification of the aortic valve. There is mild thickening of the aortic valve. Aortic valve regurgitation is trivial. Aortic valve sclerosis/calcification is present, without any evidence of aortic stenosis.  7. There is mild (Grade II) atheroma plaque involving the ascending aorta.  8. There is brief systolic reversal of flow only in the right upper pulmonary vein. There is antegrade systolic flow in the left upper pulmonary vein. FINDINGS  Left Ventricle: Left ventricular ejection fraction, by estimation, is 35%. The left ventricle has moderately decreased function. The left ventricle demonstrates global hypokinesis. The left ventricular internal cavity size was normal in size. There is mild concentric left ventricular hypertrophy. Right Ventricle: The right ventricular size is normal. No increase in right ventricular wall thickness. Right ventricular systolic function is normal. There is mildly elevated pulmonary artery systolic pressure. The tricuspid regurgitant velocity is 2.59  m/s, and with an assumed right atrial pressure of 10 mmHg, the estimated right ventricular systolic pressure is 36.8 mmHg. Left Atrium: Left atrial size was normal in size. No left atrial/left atrial appendage thrombus was detected. Right Atrium: Right atrial size was normal in size. Pericardium: There is no evidence of pericardial effusion. 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 It measures  up to 3.9 cm in length and 1 cm in width and is highly mobile, prolapsing deep in the LV in diastole and reaching the  posterior left atrial wall in systole. There is an irregular perforation of the P1 scallop with a diameter of at least 4 mm with moderate -severe insufficiency. By both 2D and 3D PISA based measurements the effective regurgitant orifice area is 0.35 cm sq, regurgitant volume 55 ml, regurgitant fraction 60%. The mitral valve is abnormal. Moderate to severe mitral valve regurgitation. No evidence of mitral valve stenosis. Tricuspid Valve: The tricuspid valve is normal in structure. Tricuspid valve regurgitation is mild to moderate. Aortic Valve: The right coronary cusp is disproportionately affected by degenerative changes. No mobile vegetations a re seen. The aortic valve is tricuspid. There is mild calcification of the aortic valve. There is mild thickening of the aortic valve. Aortic valve regurgitation is trivial. Aortic valve sclerosis/calcification is present, without any evidence of aortic stenosis. Pulmonic Valve: The pulmonic valve was grossly normal. Pulmonic valve regurgitation is trivial. Aorta: The aortic root, ascending aorta, aortic arch and descending aorta are all structurally normal, with no evidence of dilitation or obstruction. There is mild (Grade II) atheroma plaque involving the ascending aorta. Venous: There is brief systolic reversal of flow only in the right upper pulmonary vein. There is antegrade systolic flow in the left upper pulmonary vein. IVC assessment for right atrial pressure unable to be performed due to mechanical ventilation. IAS/Shunts: No atrial level shunt detected by color flow Doppler. Additional Comments: Spectral Doppler performed. LEFT VENTRICLE PLAX 2D LVOT diam:     2.04 cm LV SV:         37 LV SV Index:   21 LVOT Area:  3.27 cm  AORTIC VALVE LVOT Vmax:   82.35 cm/s LVOT Vmean:  59.251 cm/s LVOT VTI:    0.114 m MR Peak grad:    186.0 mmHg    TRICUSPID VALVE MR Mean grad:    86.0 mmHg    TR Peak grad:   26.8 mmHg MR Vmax:         682.00 cm/s  TR Vmax:        259.00 cm/s MR Vmean:        423.0 cm/s MR PISA:         6.28 cm     SHUNTS MR PISA Eff ROA: 37 mm       Systemic VTI:  0.11 m MR PISA Radius:  1.00 cm      Systemic Diam: 2.04 cm Mihai Croitoru MD Electronically signed by Thurmon Fair MD Signature Date/Time: 07/20/2023/2:12:46 PM    Final     Medications: Infusions:  ceFEPime (MAXIPIME) IV Stopped (07/20/23 2221)   dexmedetomidine (PRECEDEX) IV infusion Stopped (07/21/23 0548)   feeding supplement (VITAL 1.5 CAL) 60 mL/hr at 07/21/23 0700   fentaNYL infusion INTRAVENOUS 75 mcg/hr (07/21/23 0700)   norepinephrine (LEVOPHED) Adult infusion 8 mcg/min (07/21/23 0700)   prismasol BGK 2/2.5 replacement solution 400 mL/hr at 07/21/23 0359   prismasol BGK 2/2.5 replacement solution 500 mL/hr at 07/21/23 0358   prismasol BGK 4/2.5 1,500 mL/hr at 07/21/23 0536   vancomycin Stopped (07/21/23 0408)    Scheduled Medications:  calcitRIOL  1.5 mcg Per Tube Q M,W,F   Chlorhexidine Gluconate Cloth  6 each Topical Q0600   darbepoetin (ARANESP) injection - DIALYSIS  40 mcg Subcutaneous Q Fri-1800   docusate  100 mg Per Tube BID   famotidine  10 mg Per Tube Daily   feeding supplement (PROSource TF20)  60 mL Per Tube BID   gabapentin  100 mg Per Tube Q8H   heparin injection (subcutaneous)  5,000 Units Subcutaneous Q8H   heparin sodium (porcine)  4,600 Units Intracatheter Once   insulin aspart  0-6 Units Subcutaneous Q4H   midazolam  2 mg Intravenous Once   multivitamin with minerals  1 tablet Per Tube QHS   mouth rinse  15 mL Mouth Rinse Q2H   polyethylene glycol  17 g Per Tube Daily   sevelamer carbonate  2,400 mg Per Tube TID WC    have reviewed scheduled and prn medications.  Physical Exam: General: ill appearing, sedated/intubated Heart:RRR Lungs: intubated, b/l chest expansion Abdomen:soft, Non-tender,  non-distended Extremities:No edema, left aka Neuro: sedated Dialysis Access: left femoral TDC  Mustafa Potts 07/21/2023,8:08 AM  LOS: 11 days

## 2023-07-21 NOTE — Progress Notes (Signed)
Nutrition Follow-up  DOCUMENTATION CODES:   Severe malnutrition in context of chronic illness  INTERVENTION:   Tube Feeding via OG: Vital 1.5 at 55 ml/hr Pro-Source TF20 60 mL TID TF at goal provides 2220 kcals, 149 g of protein and 1003 mL of free water  Recommend holding Renvela while on CRRT; phosphorus currently wdl and will likely continue to trend down while on CRRT. Pt also on continuous TF and renvela TID not likely to be very effective with regard to binding much phosphorus  Change standard MVI to Renal MVI-administer BID while on CRRT; add Thiamine 100 mg per tube x 7 days  Add Banatrol TF BID, each packet provides 5g of soluble fiber  NUTRITION DIAGNOSIS:   Severe Malnutrition related to chronic illness as evidenced by severe fat depletion, severe muscle depletion.  Being addressed via TF   GOAL:   Patient will meet greater than or equal to 90% of their needs  Progressing  MONITOR:   TF tolerance, I & O's, Vent status  REASON FOR ASSESSMENT:   Ventilator    ASSESSMENT:   42 yo male admitted with DKA, AMS. PMH includes ESRD on HD, DM on insulin, diabetic gastroparesis, L AKA, HTN, R metatarsal amputation.  Pt remains on vent support, attempting to wean as able, +agitation with weaning today Started on CRRT yesterday  TEE yesterday confirmed large MV vegetation with AML perforation; noted pt will need to transfer to [facility] for surgery if accepted. Pt is extremely high risk from surgical standpoint and surgery recommends tx  Noted CT abd/pelvis with 7 x 4 cm density in aterior liver with liver steatosis vs liver mass  Tolerating Vital 1.5 at 60 ml/hr with PS BID via OG  Noted outpatient EDW 77 kg but had been leaving iHD at 75-76 kg. Current wt down to 71.2 kg  Noted pt now having type 7 liquid stools requiring FMS insertion today  Labs: sodium 132 (L), potassium 3.8 (wdl), phosphorus 3.4 (wdl) Meds: renavela TID with meals, colace and miralax prn, ss  novolog   NUTRITION - FOCUSED PHYSICAL EXAM:  Flowsheet Row Most Recent Value  Orbital Region Moderate depletion  Upper Arm Region Unable to assess  Thoracic and Lumbar Region Moderate depletion  Buccal Region Unable to assess  Temple Region Severe depletion  Clavicle Bone Region Severe depletion  Clavicle and Acromion Bone Region Severe depletion  Scapular Bone Region Severe depletion  Dorsal Hand Unable to assess  Patellar Region Severe depletion  Anterior Thigh Region Severe depletion  Posterior Calf Region Severe depletion  Edema (RD Assessment) Mild  Hair --  [thin]  Eyes --  [twitching of right eye lid, above]  Mouth Unable to assess  Skin Reviewed  Nails Unable to assess       Diet Order:   Diet Order             Diet NPO time specified  Diet effective now                   EDUCATION NEEDS:   Not appropriate for education at this time  Skin:  Skin Assessment: Skin Integrity Issues: Skin Integrity Issues:: Other (Comment) Other: skin tear and MASD to rectum, buttocks  Last BM:  10/23 type 7, FMS inserted today  Height:   Ht Readings from Last 1 Encounters:  07/19/23 5\' 7"  (1.702 m)    Weight:   Wt Readings from Last 1 Encounters:  07/21/23 71.2 kg     BMI:  Body mass index is 24.58 kg/m.  Estimated Nutritional Needs:   Kcal:  2000-2300  Protein:  145-175 g  Fluid:  1L plus UOP   Romelle Starcher MS, RDN, LDN, CNSC Registered Dietitian 3 Clinical Nutrition RD Pager and On-Call Pager Number Located in [place]

## 2023-07-21 NOTE — Progress Notes (Incomplete)
Palliative Medicine Progress Note   Patient Name: [name]       Date: 07/21/2023 DOB: 05-07-1981  Age: 42 y.o. MRN#: [id number] Attending Physician: Lynnell Catalan, MD Primary Care Physician: [name], NP Admit Date: 07/09/2023   HPI/Patient Profile: 42 y.o. male  with past medical history of ESRD on dialysis, insulin-dependent DM, anemia, left AKA, gastroparesis, and hypertension.  He presented to the ED on 07/09/2023 with altered mental status.  He was admitted with septic shock due to MRSA bacteremia and infective endocarditis. Hospitalization is complicated by acute ischemic stroke (likely septic) and acute metabolic encephalopathy resulting in aspiration leading to acute hypoxic respiratory failure.   Palliative Medicine was consulted for goals of care and  medical decision making.  Subjective: Chart reviewed and update received from RN.  Patient remains intubated, on CRRT, and requiring vasopressor support.       I was able to speak with patient's mother/Anita by phone. I introduced myself and the role of Palliative Medicine. We discussed patient's current medical situation in the larger context of his ongoing co-morbidities.   Reviewed that patient is critically ill with multiple acute issues including endocarditis, acute stroke, acute respiratory failure, and shock. We discussed that the infection on his mitral valve has worsened, and is unlikely to resolve with antibiotics alone.  Mom verbalizes understanding that patient is very high risk for valve surgery, and the cardiac surgical team has recommended consideration of transfer to a tertiary care center. At this time, mom remains open to all offered and available medical interventions to prolong life and ultimately is  hopeful for improvement.  Objective:  Physical Exam           Intake/output summary:  Intake/Output Summary (Last 24 hours) at 07/21/2023 1509 Last data filed at 07/21/2023 1400 Gross per 24 hour  Intake 2822.48 ml  Output 2498 ml  Net 324.48 ml    LBM: Last BM Date : 07/21/23     Palliative Assessment/Data: ***     Palliative Medicine Assessment & Plan   Assessment: Principal Problem:   Endocarditis of mitral valve Active Problems:   DKA (diabetic ketoacidosis) ([name])   Altered mental status   MRSA bacteremia   Hemodialysis catheter infection ([name])   Right elbow pain   ESRD on hemodialysis ([name])   Acute systolic  CHF (congestive heart failure) ([name])   DCM (dilated cardiomyopathy) ([name])   Coronary artery calcification seen on CAT scan   Infective endocarditis of tricuspid valve   Acute respiratory failure with hypoxia ([name])    Recommendations/Plan: Continue full scope interventions  Goals of Care and Additional Recommendations: Limitations on Scope of Treatment: {Recommended Scope and Preferences:21019}  Code Status: Full code   Prognosis:  {Palliative Care Prognosis:23504}  Discharge Planning: {Palliative dispostion:23505}  Care plan was discussed with ***  Thank you for allowing the Palliative Medicine Team to assist in the care of this patient.   ***   Merry Proud, NP   Please contact Palliative Medicine Team phone at [phone] for questions and concerns.  For individual providers, please see AMION.

## 2023-07-21 NOTE — Progress Notes (Signed)
Palliative Medicine Progress Note   Patient Name: [name]       Date: 07/21/2023 DOB: 09/06/1981  Age: 42 y.o. MRN#: [id number] Attending Physician: Lynnell Catalan, MD Primary Care Physician: [name], NP Admit Date: 07/09/2023  Reason for Consultation/Follow-up: {Reason for Consult:23484}  HPI/Patient Profile: 42 y.o. male  with past medical history of ESRD on dialysis, insulin-dependent DM, anemia, left AKA, gastroparesis, and hypertension.  He presented to the ED on 07/09/2023 with altered mental status.  He was admitted with septic shock due to MRSA bacteremia and infective endocarditis. Hospitalization is complicated by acute ischemic stroke (likely septic) and acute metabolic encephalopathy resulting in aspiration leading to acute hypoxic respiratory failure.   Palliative Medicine was consulted for goals of care and  medical decision making.  Subjective:        The patient is high risk to decompensate secondary to his multiple comorbidities.   Education offered regarding the seriousness of patient's current medical situation. We discussed *** At this time, mom remains open to all offered and available medical interventions to prolong life and ultimately is hopeful for improvement.  Objective:  Physical Exam           Intake/output summary:  Intake/Output Summary (Last 24 hours) at 07/21/2023 1509 Last data filed at 07/21/2023 1400 Gross per 24 hour  Intake 2822.48 ml  Output 2498 ml  Net 324.48 ml    LBM: Last BM Date : 07/21/23     Palliative Assessment/Data: ***     Palliative Medicine Assessment & Plan   Assessment: Principal Problem:   Endocarditis of mitral valve Active Problems:   DKA (diabetic ketoacidosis) ([name])   Altered mental  status   MRSA bacteremia   Hemodialysis catheter infection ([name])   Right elbow pain   ESRD on hemodialysis ([name])   Acute systolic CHF (congestive heart failure) ([name])   DCM (dilated cardiomyopathy) ([name])   Coronary artery calcification seen on CAT scan   Infective endocarditis of tricuspid valve   Acute respiratory failure with hypoxia ([name])    Recommendations/Plan: Continue full scope interventions  Goals of Care and Additional Recommendations: Limitations on Scope of Treatment: {Recommended Scope and Preferences:21019}  Code Status: Full code   Prognosis:  {Palliative Care Prognosis:23504}  Discharge Planning: {Palliative dispostion:23505}  Care plan was discussed with ***  Thank you for allowing the Palliative Medicine Team to assist in the care of this patient.   ***   Merry Proud, NP   Please contact Palliative Medicine Team phone at [phone] for questions and concerns.  For individual providers, please see AMION.

## 2023-07-21 NOTE — Inpatient Diabetes Management (Signed)
Inpatient Diabetes Program Recommendations  AACE/ADA: New Consensus Statement on Inpatient Glycemic Control (2015)  Target Ranges:  Prepandial:   less than 140 mg/dL      Peak postprandial:   less than 180 mg/dL (1-2 hours)      Critically ill patients:  140 - 180 mg/dL   Lab Results  Component Value Date   GLUCAP 262 (H) 07/21/2023   HGBA1C 8.9 (H) 07/10/2023    Review of Glycemic Control  Latest Reference Range & Units 07/20/23 16:07 07/20/23 19:59 07/20/23 23:52 07/21/23 03:38 07/21/23 07:55  Glucose-Capillary 70 - 99 mg/dL 409 (H) 811 (H) 914 (H) 272 (H) 262 (H)   Diabetes history: Type 1 DM  Outpatient Diabetes medications:  Novolin R 12 units TID, Novolin 70/30 10 units BID  Current orders for Inpatient glycemic control:  Novolog 0-6 units q 4 hours Vital 1.5 cal- 60 ml/hr Inpatient Diabetes Program Recommendations:    May consider adding Novolog 2 units q 4 hours for tube feed coverage.   Thanks,  Beryl Meager, RN, BC-ADM Inpatient Diabetes Coordinator Pager [phone]  (8a-5p)

## 2023-07-21 NOTE — Progress Notes (Signed)
NAME:  [name], MRN:  [id number], DOB:  1981-05-08, LOS: 11 ADMISSION DATE:  07/09/2023, CONSULTATION DATE:  07/10/2023 REFERRING MD:  Suezanne Jacquet, CHIEF COMPLAINT:  DKA, AMS   History of Present Illness:  Patient is a 42 yo M w/ pertinent PMH ESRD on HD MWF, IDDM, gastroparesis, left AKA, HTN presents to [facility] on 10/11 w/ AMS.   Patient recently admitted to [facility] on 7/12 sepsis from right diabetic foot ulcer. Cultures w/ MRSA. Patient had dialysis catheter removed and replaced. ID treated w/ vanc. Patient refused echo and left on 7/17 against medical advise.    On 10/11 patient was found by family member on ground altered. EMS arrived and dialysis catheter partially removed. Patient more alert for them. Has been complaining of diarrhea x1 week per family. Unsure if patient went to dialysis today. Transferred to [facility] ED. On arrival bp stable 107/70 and afebrile. Patient remains confused and occasionally with agitation. Given dilaudid. CBG 497. Beta H >8. VBG ph 7.06, 16, 54, 4.7. Given iv fluids and started on insulin drip for DKA. CT head negative for acute abnormality. Cxr no significant findings. CT abd/pelvis no acute findings; possible steatosis vs. Liver mass 7 x 4 cm recommending f/u w/ MRI. WBC 20.7 and LA 2.4. Sepsis protocol initiated. Cultures obtained and started on vanc/zosyn. Given ams, dka, and soft bp pccm consulted for icu admission.   10/21 pccm called overnight to assess for worsening hypoxia and AMS after vomiting. On NRB w/ sats 70-80s. Hypoglycemic given dextrose which improved his mental status some. Transferred to 2H icu for intubation.     Pertinent  Medical History  ESRD on HD, HTN, diabetes, diabetic gastroparesis, cellulitis of left foot, left AKA  Significant Hospital Events: Including procedures, antibiotic start and stop dates in addition to other pertinent events   10/12: altered mental status, DKA 10/12: blood cultures positive for MRSA 10/21: resp failure  requiring intubation 10/23: per nursing less responsive than yesterday, increased ETT secretions. Clotting off CRRT. CT head to r/o showering. Contacting tertiary care centers [facility]) for possible transfer   Interim History / Subjective:  Unable to participate. He does open his eyes to verbal stimulus. Does not follow commands or track me. Clotted CRRT 3 times. Only received half circuit return this AM. Hemoglobin down. Nephrology added heparin to circuit. Increased secretions with intermittent desaturations with turns. Mother at bedside has been updated on plan.   Objective   Blood pressure (!) 65/35, pulse 78, temperature 98.2 F (36.8 C), temperature source Axillary, resp. rate 18, height 5\' 7"  (1.702 m), weight 71.2 kg, SpO2 100%.    Vent Mode: PRVC FiO2 (%):  [30 %-50 %] 40 % Set Rate:  [18 bmp] 18 bmp Vt Set:  [520 mL] 520 mL PEEP:  [5 cmH20-8 cmH20] 8 cmH20 Pressure Support:  [5 cmH20] 5 cmH20 Plateau Pressure:  [20 cmH20-23 cmH20] 23 cmH20   Intake/Output Summary (Last 24 hours) at 07/21/2023 1025 Last data filed at 07/21/2023 0900 Gross per 24 hour  Intake 2409.55 ml  Output 2663 ml  Net -253.45 ml   Filed Weights   07/19/23 0450 07/20/23 0400 07/21/23 0530  Weight: 65.9 kg 70.2 kg 71.2 kg    Examination: General:  male laying in bed, critically ill appearing, underlying chronic ill appearance  HEENT: MM pink/moist, ETT in place, thick tan secretions. Pupils equal, sluggish  Neuro: on mild sedation on vent. Opens eyes to voice. Does not follow commands, or track. Moves his right arm  and leg spontaneously.  CV: s1s2 without rub, gallop. Systolic murmur PULM: clear but coarse bilaterally  GI: rounded, soft, does not grimace to palpation Extremities: warm/dry, left aka w/ left femoral tunneled HD cath in place  Skin: Old healed lesions noted to RLE. L AKA.   Resolved Hospital Problem list   DKA   Assessment & Plan:  Acute hypoxic respiratory failure; presumed from  aspiration even. ?developing pneumonia vs showing septic emboli. Intubated 10/21 for respiratory failure. Suspected mucous plugging, intermittent. - Settings PRVC FiO2 40, PEEP 8, RR 18. Apneic on SBT this AM.  - Con't Cefepime, Vanc as below - Follow resp culture 10/21 with GPCs and GPRs, awaiting speciation - CXR this AM due to increased ETT secretions, worsening mental status - adding albuterol nebs scheduled, chest PT, guaifenesin q4h PRN    Septic shock; MRSA bacteremia from 03/2023 with large mitral valve vegetation  - cont vanc and cefepime, will need prolonged course - follow trach aspirate speciation. Few GPCs and GPRs - titrate levo for map goal >65 - ID following; appreciate recs   Infective endocarditis - Continue abx as above - TCTS eval 10/22, not a candidate. Recommended transfer to tertiary.  - TEE 10/22 with EF 35%, LV global hypokinesis. Very large vegetation lateral scallop of posterior mitral leaflet 3.9cm length 1 cm width, highly mobile, prolapsing into LV  - eval for transfer to tertiary care center for possible surgical intervention after CT head    Acute metabolic encephalopathy Acute ischemic stroke/septic infarct  10/17- neuro has signed off and recommended outpatient follow up - repeat head imaging (CT) per cards due to worsening neuro exam prior to calling tertiary care center and initiating heparin via CRRT circuit  - limit sedating meds - con't abx as above - neuro OP follow up   Combined systolic/diastolic CHF -GDMT when appropriate -HD for volume removal though CRRT    T2DM  - CBG q4 - SSI - increasing coverage as BG increasing as tube feeds increasing - con't tube feeds    End-stage renal disease on hemodialysis; now on CRRT due to hemodynamic instability. Left femoral CRRT cath  Hyponatremia -nephro following, appreciate recs - Volume removal per CRRT - trend BMP / urinary output - replace electrolytes as indicated - avoid nephrotoxic agents,  ensure adequate renal perfusion - CRRT continues to clot (twice 10/22, once 10/23. Question about adding heparin to circuit. Waiting head CT to r/o septic emboli. Concern for hemorrhagic conversion. Will discuss with nephro)    Anemia/thrombocytopenia - trend cbc - likely related to critical illness.    Incidental finding of Liver steatosis vs. Liver mass -CT abd/pelvis showing 7 x 4 cm density in anterior liver  Plan: -f/u outpt w/ MRI  Best Practice (right click and "Reselect all SmartList Selections" daily)   Diet/type: tubefeeds DVT prophylaxis: other GI prophylaxis: H2B Lines: Central line, Dialysis Catheter, Arterial Line, and yes and it is still needed Foley:  Yes, and it is still needed Code Status:  full code Last date of multidisciplinary goals of care discussion [10/23: updated mom at bedside on plan of care]  Labs   CBC: Recent Labs  Lab 07/16/23 1835 07/19/23 0207 07/19/23 0434 07/19/23 0958 07/20/23 0349 07/21/23 0329 07/21/23 0341  WBC 14.9*  --  ACCURACY OF RESULTS QUESTIONABLE. RECOMMEND RECOLLECT TO VERIFY. 73.5* 44.8* 28.8*  --   HGB 8.9*   < > ACCURACY OF RESULTS QUESTIONABLE. RECOMMEND RECOLLECT TO VERIFY. 10.2* 11.1* 8.1* 9.9*  HCT 27.8*   < >  ACCURACY OF RESULTS QUESTIONABLE. RECOMMEND RECOLLECT TO VERIFY. 33.2* 34.7* 26.6* 29.0*  MCV 85.3  --  ACCURACY OF RESULTS QUESTIONABLE. RECOMMEND RECOLLECT TO VERIFY. 89.0 88.7 90.5  --   PLT 168  --  ACCURACY OF RESULTS QUESTIONABLE. RECOMMEND RECOLLECT TO VERIFY. 179 163 162  --    < > = values in this interval not displayed.    Basic Metabolic Panel: Recent Labs  Lab 07/16/23 1835 07/19/23 0207 07/19/23 0434 07/19/23 0958 07/20/23 0610 07/20/23 1638 07/21/23 0327 07/21/23 0341  NA 131*   < > 134* 132* 131* 130* 131* 132*  K 3.5   < > 4.1 4.3 4.4 4.0 3.7 3.8  CL 91*  --  92* 94* 91* 95* 98  --   CO2 21*  --  22 21* 22 20* 23  --   GLUCOSE 223*  --  106* 160* 167* 262* 246*  --   BUN 61*  --  39*  41* 47* 50* 35*  --   CREATININE 10.04*  --  9.46* 10.00* 10.96* 10.24* 6.46*  --   CALCIUM 7.9*  --  8.6* 7.5* 7.7* 6.9* 7.3*  --   MG  --   --  2.7*  --  2.6*  --  2.5*  --   PHOS 3.7  --  4.7*  --  5.5* 5.1* 3.4  --    < > = values in this interval not displayed.   GFR: Estimated Creatinine Clearance: 13.8 mL/min (A) (by C-G formula based on SCr of 6.46 mg/dL (H)). Recent Labs  Lab 07/19/23 0434 07/19/23 0958 07/20/23 0349 07/20/23 1136 07/21/23 0329 07/21/23 0347  WBC ACCURACY OF RESULTS QUESTIONABLE. RECOMMEND RECOLLECT TO VERIFY. 73.5* 44.8*  --  28.8*  --   LATICACIDVEN 2.5* 2.7*  --  2.7*  --  0.8    Liver Function Tests: Recent Labs  Lab 07/16/23 1835 07/19/23 0958 07/20/23 1638 07/21/23 0327  AST  --  22  --   --   ALT  --  7  --   --   ALKPHOS  --  169*  --   --   BILITOT  --  1.3*  --   --   PROT  --  7.4  --   --   ALBUMIN 2.0* 2.2* 1.7* 1.6*   No results for input(s): "LIPASE", "AMYLASE" in the last 168 hours. No results for input(s): "AMMONIA" in the last 168 hours.  ABG    Component Value Date/Time   PHART 7.360 07/21/2023 0341   PCO2ART 42.0 07/21/2023 0341   PO2ART 159 (H) 07/21/2023 0341   HCO3 23.7 07/21/2023 0341   TCO2 25 07/21/2023 0341   ACIDBASEDEF 2.0 07/21/2023 0341   O2SAT 99 07/21/2023 0341     Coagulation Profile: No results for input(s): "INR", "PROTIME" in the last 168 hours.  Cardiac Enzymes: No results for input(s): "CKTOTAL", "CKMB", "CKMBINDEX", "TROPONINI" in the last 168 hours.  HbA1C: Hemoglobin A1C  Date/Time Value Ref Range Status  11/11/2022 12:00 AM 9.5  Final  08/22/2014 06:28 AM 5.8 4.2 - 6.3 % Final    Comment:    The American Diabetes Association recommends that a primary goal of therapy should be <7% and that physicians should reevaluate the treatment regimen in patients with HbA1c values consistently >8%.    HbA1c, POC (controlled diabetic range)  Date/Time Value Ref Range Status  06/25/2022 02:02 PM  10.6 (A) 0.0 - 7.0 % Final   Hgb A1c MFr Bld  Date/Time Value Ref Range Status  07/10/2023 01:42 AM 8.9 (H) 4.8 - 5.6 % Final    Comment:    (NOTE) Pre diabetes:          5.7%-6.4%  Diabetes:              >6.4%  Glycemic control for   <7.0% adults with diabetes   04/12/2023 03:00 PM 8.7 (H) 4.8 - 5.6 % Final    Comment:    (NOTE) Pre diabetes:          5.7%-6.4%  Diabetes:              >6.4%  Glycemic control for   <7.0% adults with diabetes     CBG: Recent Labs  Lab 07/20/23 1607 07/20/23 1959 07/20/23 2352 07/21/23 0338 07/21/23 0755  GLUCAP 262* 218* 177* 272* 262*    Review of Systems:   As above  Past Medical History:  He,  has a past medical history of Anemia, Cellulitis and abscess of toe of left foot, Diabetes mellitus without complication ([name]), Diabetic gastroparesis ([name]), Diabetic ulcer of left midfoot associated with diabetes mellitus due to underlying condition, with necrosis of bone ([name]), Dialysis patient ([name]), ESRD (end stage renal disease) ([name]), Hypertension, and Sepsis ([name]).   Surgical History:   Past Surgical History:  Procedure Laterality Date   AMPUTATION Right 02/02/2018   Procedure: RIGHT FIFTH TOE AND METATARSAL AMPUTATION. Filetted toe flap metatarsal resection. Debridement Plantar Foot wound;  Surgeon: Park Liter, DPM;  Location: [facility] OR;  Service: Podiatry;  Laterality: Right;   AMPUTATION Left 08/20/2018   Procedure: FIFTH METATARSAL BONE BIOPSY;  Surgeon: Park Liter, DPM;  Location: [facility] OR;  Service: Podiatry;  Laterality: Left;   AMPUTATION Left 10/28/2018   Procedure: LEFT GREAT TOE AMPUTATION;  Surgeon: Park Liter, DPM;  Location: [facility] OR;  Service: Podiatry;  Laterality: Left;   AMPUTATION Left 09/16/2020   Procedure: AMPUTATION BELOW KNEE;  Surgeon: Larina Earthly, MD;  Location: [facility] OR;  Service: Vascular;  Laterality: Left;   AMPUTATION Left 10/15/2020   Procedure: AMPUTATION ABOVE KNEE;  Surgeon: Sherren Kerns, MD;  Location: [facility] OR;  Service: Vascular;  Laterality: Left;   APPLICATION OF WOUND VAC  02/02/2018   Procedure: APPLICATION OF WOUND VAC  Right Foot;  Surgeon: Park Liter, DPM;  Location: [facility] OR;  Service: Podiatry;;   APPLICATION OF WOUND VAC Left 10/28/2018   Procedure: APPLICATION OF WOUND VAC LEFT TOE;  Surgeon: Park Liter, DPM;  Location: [facility] OR;  Service: Podiatry;  Laterality: Left;   APPLICATION OF WOUND VAC Left 11/01/2018   Procedure: APPLICATION OF WOUND VAC;  Surgeon: Park Liter, DPM;  Location: [facility] OR;  Service: Podiatry;  Laterality: Left;   AV FISTULA PLACEMENT     left arm.   AV FISTULA PLACEMENT Right 12/22/2016   Procedure: INSERTION OF ARTERIOVENOUS (AV) GORE-TEX GRAFT ARM;  Surgeon: Sherren Kerns, MD;  Location: [facility] OR;  Service: Vascular;  Laterality: Right;   AV FISTULA PLACEMENT Left 05/26/2018   Procedure: INSERTION OF  ARTERIOVENOUS (AV) GORE-TEX GRAFT LEFT ARM;  Surgeon: Nada Libman, MD;  Location: [facility] OR;  Service: Vascular;  Laterality: Left;   EYE SURGERY     I & D EXTREMITY Right 10/31/2017   Procedure: IRRIGATION AND DEBRIDEMENT RIGHT FOOT;  Surgeon: Park Liter, DPM;  Location: [facility] OR;  Service: Podiatry;  Laterality: Right;   I & D EXTREMITY Left 08/20/2018  Procedure: IRRIGATION AND DEBRIDEMENT EXTREMITY WITH SECONDARY WOUND CLOSUREAND APPLICATION OF WOUND VAC LEFT FOOT;  Surgeon: Park Liter, DPM;  Location: [facility] OR;  Service: Podiatry;  Laterality: Left;   I & D EXTREMITY Left 10/20/2018   Procedure: IRRIGATION AND DEBRIDEMENT LEFT FOOT  DEBRIDEMENT LATERAL FOOT WOUND;  Surgeon: Park Liter, DPM;  Location: [facility] OR;  Service: Podiatry;  Laterality: Left;   I & D EXTREMITY Left 10/28/2018   Procedure: IRRIGATION AND DEBRIDEMENT LEFT TOE;  Surgeon: Park Liter, DPM;  Location: [facility] OR;  Service: Podiatry;  Laterality: Left;   I & D EXTREMITY Left 09/14/2020   Procedure: IRRIGATION AND DEBRIDEMENT WRIST;  Surgeon: Betha Loa, MD;   Location: [facility] OR;  Service: Orthopedics;  Laterality: Left;   INSERTION OF DIALYSIS CATHETER     Right subclavian   IR AV DIALY SHUNT INTRO NEEDLE/INTRACATH INITIAL W/PTA/IMG RIGHT Right 02/05/2018   IR FLUORO GUIDE CV LINE LEFT  04/14/2023   IR FLUORO GUIDE CV LINE LEFT  07/16/2023   IR FLUORO GUIDE CV LINE RIGHT  01/31/2020   IR FLUORO GUIDE CV LINE RIGHT  01/30/2022   IR PTA VENOUS EXCEPT DIALYSIS CIRCUIT  01/30/2022   IR REMOVAL TUN CV CATH W/O FL  04/12/2023   IR THROMBECTOMY AV FISTULA W/THROMBOLYSIS/PTA INC/SHUNT/IMG LEFT Left 08/24/2018   IR THROMBECTOMY AV FISTULA W/THROMBOLYSIS/PTA INC/SHUNT/IMG LEFT Left 01/06/2019   IR US GUIDE VASC ACCESS LEFT  08/24/2018   IR US GUIDE VASC ACCESS LEFT  04/14/2023   IR US GUIDE VASC ACCESS LEFT  07/16/2023   IR US GUIDE VASC ACCESS LEFT  07/16/2023   IR US GUIDE VASC ACCESS RIGHT  02/05/2018   IR US GUIDE VASC ACCESS RIGHT  01/31/2020   IR US GUIDE VASC ACCESS RIGHT  07/16/2023   IR VENO/JUGULAR LEFT  07/16/2023   IR VENO/JUGULAR RIGHT  07/16/2023   IR VENOCAVAGRAM SVC  01/30/2022   IRRIGATION AND DEBRIDEMENT FOOT Right 10/23/2018   Procedure: Irrigation and Debridement to tendon, Left Foot;  Surgeon: Park Liter, DPM;  Location: [facility] OR;  Service: Podiatry;  Laterality: Right;   IRRIGATION AND DEBRIDEMENT FOOT Left 11/01/2018   Procedure: IRRIGATION AND DEBRIDEMENT PARTIAL WOUND CLOSURE LOCAL TISSUE TRANSFER AND FLAP ROTATION, LEFT FOOT;  Surgeon: Park Liter, DPM;  Location: [facility] OR;  Service: Podiatry;  Laterality: Left;   TRANSESOPHAGEAL ECHOCARDIOGRAM (CATH LAB) N/A 07/14/2023   Procedure: TRANSESOPHAGEAL ECHOCARDIOGRAM;  Surgeon: Thomasene Ripple, DO;  Location: [facility] INVASIVE CV LAB;  Service: Cardiovascular;  Laterality: N/A;   TRANSMETATARSAL AMPUTATION N/A 08/18/2018   Procedure: IRRIGATION AND DEBRIDEMENT OF LEFT 5TH TOE AND TRANSMETATARSAL, WITH PARTICAL LEFT 5TH TOE AND METATARSAL AMPUTATION, BONE BIOPSY, WOUND VAC APPLICATION.;  Surgeon: Park Liter, DPM;  Location: [facility] OR;  Service: Podiatry;  Laterality: N/A;   UPPER EXTREMITY VENOGRAPHY N/A 11/16/2016   Procedure: Upper Extremity Venography - Right Central;  Surgeon: Sherren Kerns, MD;  Location: [facility] INVASIVE CV LAB;  Service: Cardiovascular;  Laterality: N/A;   UPPER EXTREMITY VENOGRAPHY N/A 05/25/2018   Procedure: UPPER EXTREMITY VENOGRAPHY - Bilateral;  Surgeon: Cephus Shelling, MD;  Location: [facility] INVASIVE CV LAB;  Service: Cardiovascular;  Laterality: N/A;     Social History:   reports that he has never smoked. He has never used smokeless tobacco. He reports that he does not drink alcohol and does not use drugs.   Family History:  His family history includes Diabetes in his father; Diabetes Mellitus II in  an other family member; Renal Disease in his father.   Allergies No Known Allergies   Home Medications  Prior to Admission medications   Medication Sig Start Date End Date Taking? Authorizing Provider  calcitRIOL (ROCALTROL) 0.5 MCG capsule Take 3 capsules (1.5 mcg total) by mouth every Monday, Wednesday, and Friday with hemodialysis. 05/23/21  Yes Kathlen Mody, MD  cinacalcet (SENSIPAR) 60 MG tablet Take 120 mg by mouth every evening. 04/10/22  Yes [provider]  gabapentin (NEURONTIN) 100 MG capsule TAKE 1 CAPSULE BY MOUTH THREE TIMES DAILY 07/05/23  Yes [name], NP  multivitamin (RENA-VIT) TABS tablet Take 1 tablet by mouth at bedtime. 05/23/21  Yes Kathlen Mody, MD  NOVOLIN R 100 UNIT/ML injection INJECT 12 UNITS SUBCUTANEOUSLY THREE TIMES DAILY BEFORE MEAL(S) 07/05/23  Yes [name], NP  sevelamer carbonate (RENVELA) 800 MG tablet Take 2,400 mg by mouth 3 (three) times daily with meals. 04/27/19  Yes [provider]  glucose blood (ONETOUCH VERIO) test strip USE 1 STRIP TO CHECK GLUCOSE THREE TIMES DAILY AS DIRECTED 05/12/23   [name], NP     Critical care time: 40    [facility] 07/21/2023, 10:25 AM  Please see [email or web address] for pager details.  From 7A-7P if no response, please call [phone]. After hours, please call ELink [phone].

## 2023-07-21 NOTE — Progress Notes (Signed)
OT Cancellation Note  Patient Details Name: [name] MRN: [id number] DOB: [birth year]   Cancelled Treatment:    Reason Eval/Treat Not Completed: Medical issues which prohibited therapy. Pt with yellow-red MEWs much of day, in restraints, has been agitated. On CRRT femorally. Due to this, RN requesting hold.   Tyler Deis, OTR/L [facility] Office: [phone]   [name] 07/21/2023, 2:58 PM

## 2023-07-21 NOTE — Progress Notes (Signed)
High access and filter pressures continuing to be issue with CRRT. Unable to increase BFR due to this. Nephrology aware.

## 2023-07-21 NOTE — Progress Notes (Signed)
Patient Name: [name] Date of Encounter: 07/21/2023 [facility] Cardiologist: None   Interval Summary  .    Intermittently responsive.  Receiving sedation.  Even when sedation is weaned down he is not as responsive as he was yesterday morning. Heavy respiratory secretions. Requiring a slightly higher dose of norepinephrine IV to maintain blood pressure today.   Vital Signs .    Vitals:   07/21/23 0715 07/21/23 0730 07/21/23 0745 07/21/23 0800  BP:    (!) 93/39  Pulse: 90   83  Resp: 18 16 (!) 8 18  Temp:    98.2 F (36.8 C)  TempSrc:    Axillary  SpO2: 100%   100%  Weight:      Height:        Intake/Output Summary (Last 24 hours) at 07/21/2023 0827 Last data filed at 07/21/2023 0700 Gross per 24 hour  Intake 2419.11 ml  Output 2350 ml  Net 69.11 ml      07/21/2023    5:30 AM 07/20/2023    4:00 AM 07/19/2023    4:50 AM  Last 3 Weights  Weight (lbs) 156 lb 15.5 oz 154 lb 12.2 oz 145 lb 4.5 oz  Weight (kg) 71.2 kg 70.2 kg 65.9 kg      Telemetry/ECG    Sinus rhythm/sinus tachycardia- Personally Reviewed  Physical Exam .   GEN: No acute distress.   Neck: No JVD Cardiac: RRR, 1/6 apical holosystolic murmur, no diastolic murmurs, rubs, or gallops.  Respiratory: Clear to auscultation bilaterally. GI: Soft, nontender, non-distended  MS: No edema Neuro: Flaccid left hemiparesis.  Left AKA.  Assessment & Plan .     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  Has evidence of progression of infection with enlarging vegetation and perforation of the posterior mitral leaflet despite appropriate intravenous antibiotics. Yesterday morning there was an abrupt change  in his level of alertness, beyond the effect of sedatives.  Cannot exclude another stroke. It is highly unlikely that his endocarditis will be cured with antibiotics in view of the size of the vegetation, the increasing bulk of the vegetation and his multiple comorbid conditions including diabetes mellitus. He is an extremely high risk candidate for mitral valve surgery and our surgical team has recommended consideration of transfer to a tertiary care center. Will wait for repeat imaging of his brain before reaching out to a tertiary Medical Center. Unfortunately, I believe it is a high likelihood that he will be considered too high risk for surgery in any situation.  In that case I would strongly recommend the discussion of palliative care.  CRITICAL CARE Performed by: Rachelle Hora Taralee Marcus   Total critical care time: 60 minutes  Critical care time was exclusive of separately billable procedures and treating other patients.  Critical care was necessary to treat or prevent imminent or life-threatening deterioration.  Critical care was time spent personally by me on the following activities: development of treatment plan with patient and/or surrogate as well as nursing, discussions with consultants, evaluation of patient's response to treatment, examination of patient, obtaining history from patient or surrogate, ordering and performing treatments and interventions, ordering and review of laboratory studies, ordering and review of radiographic studies, pulse oximetry and re-evaluation of patient's condition.    For questions or updates,  please contact [facility] Please consult [email or web address] for contact info under        Signed, Shane Fair, MD

## 2023-07-21 NOTE — Progress Notes (Signed)
Patient transported to CT and back without complication. 

## 2023-07-21 NOTE — Progress Notes (Signed)
CPT stopped after 5 minutes. Patient unable to tolerate at this time. Patient very agitated with sats dropping into possible 40s, questionable wave form. Patient also placed back on full ventilator support at this time. RT and RN at beside. MD aware. Copious amounts of secretions suctioned from airway. New pulse ox placed. Patient currently sating 94%.

## 2023-07-21 NOTE — Progress Notes (Signed)
Pharmacy Antibiotic Note  [name] is a 42 y.o. male admitted on 07/09/2023 with {Indications:3041527}.  Pharmacy has been consulted for *** dosing.  Plan: {Assessment:21075}  Height: 5\' 7"  (170.2 cm) Weight: 71.2 kg (156 lb 15.5 oz) IBW/kg (Calculated) : 66.1  Temp (24hrs), Avg:98.5 F (36.9 C), Min:98.2 F (36.8 C), Max:98.9 F (37.2 C)  Recent Labs  Lab 07/15/23 1500 07/16/23 1835 07/19/23 0434 07/19/23 0958 07/20/23 0349 07/20/23 0610 07/20/23 1136 07/20/23 1638 07/21/23 0327 07/21/23 0329 07/21/23 0347  WBC  --  14.9* ACCURACY OF RESULTS QUESTIONABLE. RECOMMEND RECOLLECT TO VERIFY. 73.5* 44.8*  --   --   --   --  28.8*  --   CREATININE  --  10.04* 9.46* 10.00*  --  10.96*  --  10.24* 6.46*  --   --   LATICACIDVEN  --   --  2.5* 2.7*  --   --  2.7*  --   --   --  0.8  VANCORANDOM 25  --   --   --   --   --   --   --   --   --   --     Estimated Creatinine Clearance: 13.8 mL/min (A) (by C-G formula based on SCr of 6.46 mg/dL (H)).    No Known Allergies  Antimicrobials this admission: *** *** >> *** *** *** >> ***  Dose adjustments this admission: ***  Microbiology results: *** BCx: *** *** UCx: ***  *** Sputum: ***  *** MRSA PCR: ***  Thank you for allowing pharmacy to be a part of this patient's care.  Rolley Sims 07/21/2023 12:45 PM

## 2023-07-21 NOTE — Progress Notes (Signed)
[facility]  Date of Admission:  07/09/2023     Total days of antibiotics 13         ASSESSMENT:  [name] had a mildly elevated temperature and improved leukocytosis in the setting of disseminated MRSA infection and mitral valve endocarditis. Course now complicated by larger vegetation and worsening valvular damage despite appropriate antibiotic therapy. Extremely high risk surgical candidate and would need transfer to higher level of care for any potential surgical intervention if it is an option. Given burden of infection would suspect this infection would not be amenable to antibiotics alone. Agree with continued palliative care discussion for goals of care. Remains on ventilatory and CRRT support. Continue current dose of vancomycin. Remaining medical and supportive care per CCM.   PLAN:  Continue current dose of vancomycin.  Agree with continued palliative care discussion if surgery is not an option as unlikely to cure through antibiotics alone. Remaining medical and supportive care per PCCM.    I have personally spent 28 minutes involved in face-to-face and non-face-to-face activities for this patient on the day of the visit. Professional time spent includes the following activities: Preparing to see the patient (review of tests), Obtaining and/or reviewing separately obtained history (admission/discharge record), Performing a medically appropriate examination and/or evaluation , Ordering medications/tests/procedures, referring and communicating with other health care professionals, Documenting clinical information in the EMR, Independently interpreting results (not separately reported), Communicating results to the patient/family/caregiver, Counseling and educating the patient/family/caregiver and Care coordination (not separately reported).    Principal Problem:   Endocarditis of mitral valve Active Problems:   DKA (diabetic ketoacidosis) ([name])    Altered mental status   MRSA bacteremia   Hemodialysis catheter infection ([name])   Right elbow pain   ESRD on hemodialysis ([name])   Acute systolic CHF (congestive heart failure) ([name])   DCM (dilated cardiomyopathy) ([name])   Coronary artery calcification seen on CAT scan   Infective endocarditis of tricuspid valve   Acute respiratory failure with hypoxia ([name])    albuterol  2.5 mg Nebulization Q4H   calcitRIOL  1.5 mcg Per Tube Q M,W,F   Chlorhexidine Gluconate Cloth  6 each Topical Q0600   darbepoetin (ARANESP) injection - DIALYSIS  40 mcg Subcutaneous Q Fri-1800   famotidine  10 mg Per Tube Daily   feeding supplement (PROSource TF20)  60 mL Per Tube BID   gabapentin  100 mg Per Tube Q8H   heparin injection (subcutaneous)  5,000 Units Subcutaneous Q8H   heparin sodium (porcine)  4,600 Units Intracatheter Once   insulin aspart  0-9 Units Subcutaneous Q4H   multivitamin with minerals  1 tablet Per Tube QHS   mouth rinse  15 mL Mouth Rinse Q2H   sevelamer carbonate  2,400 mg Per Tube TID WC    SUBJECTIVE:  [name] temperature in of 100.5 F in the last 24 hours and WBC count continues to downtrend. Remains intubated on Precedex requiring vasopressor support and CRRT. Follows some commands.   No Known Allergies   Review of Systems: Review of Systems  Unable to perform ROS: Intubated      OBJECTIVE: Vitals:   07/21/23 0915 07/21/23 0930 07/21/23 1138 07/21/23 1141  BP:      Pulse: 81 78 93   Resp: (!) 0 18 19   Temp:      TempSrc:      SpO2: 100% 100% 99% 98%  Weight:      Height:  Body mass index is 24.58 kg/m.  Physical Exam Constitutional:      General: He is not in acute distress.    Appearance: He is well-developed. He is ill-appearing.     Interventions: He is intubated and restrained.     Comments: Eyes open and may occasionally follow command - right sided gaze.   Cardiovascular:     Rate and Rhythm: Normal rate and regular rhythm.     Heart sounds:  Normal heart sounds.  Pulmonary:     Effort: Pulmonary effort is normal. He is intubated.     Breath sounds: Normal breath sounds.  Skin:    General: Skin is warm and dry.     Lab Results Lab Results  Component Value Date   WBC 28.8 (H) 07/21/2023   HGB 9.9 (L) 07/21/2023   HCT 29.0 (L) 07/21/2023   MCV 90.5 07/21/2023   PLT 162 07/21/2023    Lab Results  Component Value Date   CREATININE 6.46 (H) 07/21/2023   BUN 35 (H) 07/21/2023   NA 132 (L) 07/21/2023   K 3.8 07/21/2023   CL 98 07/21/2023   CO2 23 07/21/2023    Lab Results  Component Value Date   ALT 7 07/19/2023   AST 22 07/19/2023   ALKPHOS 169 (H) 07/19/2023   BILITOT 1.3 (H) 07/19/2023     Microbiology: Recent Results (from the past 240 hour(s))  Culture, blood (Routine X 2) w Reflex to ID Panel     Status: None   Collection Time: 07/15/23  3:00 PM   Specimen: BLOOD RIGHT ARM  Result Value Ref Range Status   Specimen Description BLOOD RIGHT ARM  Final   Special Requests   Final    BOTTLES DRAWN AEROBIC AND ANAEROBIC Blood Culture adequate volume   Culture   Final    NO GROWTH 5 DAYS Performed at [facility], [address], [place], [name] [address]    Report Status 07/20/2023 FINAL  Final  Culture, blood (Routine X 2) w Reflex to ID Panel     Status: None   Collection Time: 07/15/23  3:02 PM   Specimen: BLOOD RIGHT ARM  Result Value Ref Range Status   Specimen Description BLOOD RIGHT ARM  Final   Special Requests   Final    BOTTLES DRAWN AEROBIC AND ANAEROBIC Blood Culture adequate volume   Culture   Final    NO GROWTH 5 DAYS Performed at [facility], [address], [place], [name] [address]    Report Status 07/20/2023 FINAL  Final  Culture, Respiratory w Gram Stain     Status: None   Collection Time: 07/19/23 12:55 AM   Specimen: Tracheal Aspirate; Respiratory  Result Value Ref Range Status   Specimen Description TRACHEAL ASPIRATE  Final   Special Requests NONE  Final    Gram Stain   Final    FEW WBC PRESENT, PREDOMINANTLY PMN FEW GRAM POSITIVE COCCI FEW GRAM POSITIVE RODS    Culture   Final    FEW Consistent with normal respiratory flora. No Pseudomonas species isolated Performed at [facility], [address] [place], [name] [address]    Report Status 07/21/2023 FINAL  Final     Marcos Eke, NP [facility]  07/21/2023  12:29 PM

## 2023-07-22 DIAGNOSIS — Z89612 Acquired absence of left leg above knee: Secondary | ICD-10-CM | POA: Diagnosis not present

## 2023-07-22 DIAGNOSIS — E43 Unspecified severe protein-calorie malnutrition: Secondary | ICD-10-CM | POA: Insufficient documentation

## 2023-07-22 DIAGNOSIS — I059 Rheumatic mitral valve disease, unspecified: Secondary | ICD-10-CM | POA: Diagnosis not present

## 2023-07-22 DIAGNOSIS — E1022 Type 1 diabetes mellitus with diabetic chronic kidney disease: Secondary | ICD-10-CM | POA: Diagnosis not present

## 2023-07-22 DIAGNOSIS — I340 Nonrheumatic mitral (valve) insufficiency: Secondary | ICD-10-CM | POA: Diagnosis not present

## 2023-07-22 DIAGNOSIS — A4102 Sepsis due to Methicillin resistant Staphylococcus aureus: Secondary | ICD-10-CM | POA: Diagnosis not present

## 2023-07-22 LAB — POCT ACTIVATED CLOTTING TIME
Activated Clotting Time: 165 s
Activated Clotting Time: 165 s
Activated Clotting Time: 165 s
Activated Clotting Time: 165 s
Activated Clotting Time: 171 s
Activated Clotting Time: 171 s
Activated Clotting Time: 171 s
Activated Clotting Time: 171 s
Activated Clotting Time: 177 s
Activated Clotting Time: 177 s
Activated Clotting Time: 177 s
Activated Clotting Time: 177 s
Activated Clotting Time: 183 s
Activated Clotting Time: 183 s
Activated Clotting Time: 183 s
Activated Clotting Time: 183 s
Activated Clotting Time: 183 s
Activated Clotting Time: 183 s
Activated Clotting Time: 183 s
Activated Clotting Time: 183 s
Activated Clotting Time: 189 s
Activated Clotting Time: 189 s
Activated Clotting Time: 195 s
Activated Clotting Time: 201 s
Activated Clotting Time: 201 s
Activated Clotting Time: 201 s
Activated Clotting Time: 207 s

## 2023-07-22 LAB — RENAL FUNCTION PANEL
Albumin: 1.7 g/dL — ABNORMAL LOW (ref 3.5–5.0)
Albumin: 1.9 g/dL — ABNORMAL LOW (ref 3.5–5.0)
Anion gap: 8 (ref 5–15)
Anion gap: 11 (ref 5–15)
BUN: 23 mg/dL — ABNORMAL HIGH (ref 6–20)
BUN: 20 mg/dL (ref 6–20)
CO2: 23 mmol/L (ref 22–32)
CO2: 23 mmol/L (ref 22–32)
Calcium: 7.9 mg/dL — ABNORMAL LOW (ref 8.9–10.3)
Calcium: 8.5 mg/dL — ABNORMAL LOW (ref 8.9–10.3)
Chloride: 101 mmol/L (ref 98–111)
Chloride: 102 mmol/L (ref 98–111)
Creatinine, Ser: 3.49 mg/dL — ABNORMAL HIGH (ref 0.61–1.24)
Creatinine, Ser: 2.76 mg/dL — ABNORMAL HIGH (ref 0.61–1.24)
GFR, Estimated: 21 mL/min — ABNORMAL LOW (ref >60)
GFR, Estimated: 28 mL/min — ABNORMAL LOW (ref >60)
Glucose, Bld: 305 mg/dL — ABNORMAL HIGH (ref 70–99)
Glucose, Bld: 153 mg/dL — ABNORMAL HIGH (ref 70–99)
Phosphorus: 2.2 mg/dL — ABNORMAL LOW (ref 2.5–4.6)
Phosphorus: 2.1 mg/dL — ABNORMAL LOW (ref 2.5–4.6)
Potassium: 4.1 mmol/L (ref 3.5–5.1)
Potassium: 4.3 mmol/L (ref 3.5–5.1)
Sodium: 132 mmol/L — ABNORMAL LOW (ref 135–145)
Sodium: 136 mmol/L (ref 135–145)

## 2023-07-22 LAB — POCT I-STAT 7, (LYTES, BLD GAS, ICA,H+H)
Acid-Base Excess: 0 mmol/L (ref 0.0–2.0)
Acid-Base Excess: 2 mmol/L (ref 0.0–2.0)
Bicarbonate: 24 mmol/L (ref 20.0–28.0)
Bicarbonate: 25.3 mmol/L (ref 20.0–28.0)
Calcium, Ion: 1.12 mmol/L — ABNORMAL LOW (ref 1.15–1.40)
Calcium, Ion: 1.1 mmol/L — ABNORMAL LOW (ref 1.15–1.40)
HCT: 25 % — ABNORMAL LOW (ref 39.0–52.0)
HCT: 25 % — ABNORMAL LOW (ref 39.0–52.0)
Hemoglobin: 8.5 g/dL — ABNORMAL LOW (ref 13.0–17.0)
Hemoglobin: 8.5 g/dL — ABNORMAL LOW (ref 13.0–17.0)
O2 Saturation: 99 %
O2 Saturation: 96 %
Patient temperature: 99.2
Patient temperature: 37.6
Potassium: 3.9 mmol/L (ref 3.5–5.1)
Potassium: 4.2 mmol/L (ref 3.5–5.1)
Sodium: 135 mmol/L (ref 135–145)
Sodium: 134 mmol/L — ABNORMAL LOW (ref 135–145)
TCO2: 25 mmol/L (ref 22–32)
TCO2: 26 mmol/L (ref 22–32)
pCO2 arterial: 36.8 mm[Hg] (ref 32–48)
pCO2 arterial: 36.3 mm[Hg] (ref 32–48)
pH, Arterial: 7.423 (ref 7.35–7.45)
pH, Arterial: 7.453 — ABNORMAL HIGH (ref 7.35–7.45)
pO2, Arterial: 134 mm[Hg] — ABNORMAL HIGH (ref 83–108)
pO2, Arterial: 80 mm[Hg] — ABNORMAL LOW (ref 83–108)

## 2023-07-22 LAB — CBC
HCT: 24 % — ABNORMAL LOW (ref 39.0–52.0)
Hemoglobin: 7.1 g/dL — ABNORMAL LOW (ref 13.0–17.0)
MCH: 27.3 pg (ref 26.0–34.0)
MCHC: 29.6 g/dL — ABNORMAL LOW (ref 30.0–36.0)
MCV: 92.3 fL (ref 80.0–100.0)
Platelets: 116 10*3/uL — ABNORMAL LOW (ref 150–400)
RBC: 2.6 MIL/uL — ABNORMAL LOW (ref 4.22–5.81)
RDW: 15.5 % (ref 11.5–15.5)
WBC: 22.4 10*3/uL — ABNORMAL HIGH (ref 4.0–10.5)
nRBC: 0 % (ref 0.0–0.2)

## 2023-07-22 LAB — MAGNESIUM: Magnesium: 2.6 mg/dL — ABNORMAL HIGH (ref 1.7–2.4)

## 2023-07-22 LAB — APTT: aPTT: 65 s — ABNORMAL HIGH (ref 24–36)

## 2023-07-22 LAB — GLUCOSE, CAPILLARY
Glucose-Capillary: 100 mg/dL — ABNORMAL HIGH (ref 70–99)
Glucose-Capillary: 102 mg/dL — ABNORMAL HIGH (ref 70–99)
Glucose-Capillary: 131 mg/dL — ABNORMAL HIGH (ref 70–99)
Glucose-Capillary: 134 mg/dL — ABNORMAL HIGH (ref 70–99)
Glucose-Capillary: 189 mg/dL — ABNORMAL HIGH (ref 70–99)
Glucose-Capillary: 245 mg/dL — ABNORMAL HIGH (ref 70–99)
Glucose-Capillary: 278 mg/dL — ABNORMAL HIGH (ref 70–99)
Glucose-Capillary: 285 mg/dL — ABNORMAL HIGH (ref 70–99)
Glucose-Capillary: 320 mg/dL — ABNORMAL HIGH (ref 70–99)
Glucose-Capillary: 63 mg/dL — ABNORMAL LOW (ref 70–99)

## 2023-07-22 LAB — VANCOMYCIN, RANDOM: Vancomycin Rm: 16 ug/mL

## 2023-07-22 MED ORDER — OLANZAPINE 10 MG IM SOLR
5.0000 mg | Freq: Once | INTRAMUSCULAR | Status: AC | PRN
  Administered 2023-07-23: 5 mg via INTRAMUSCULAR
  Filled 2023-07-22: qty 10

## 2023-07-22 MED ORDER — GERHARDT'S BUTT CREAM
TOPICAL_CREAM | Freq: Three times a day (TID) | CUTANEOUS | Status: AC | PRN
  Filled 2023-07-22 (×2): qty 1

## 2023-07-22 MED ORDER — INSULIN ASPART 100 UNIT/ML IJ SOLN
0.0000 [IU] | INTRAMUSCULAR | Status: DC
  Administered 2023-07-22: 15 [IU] via SUBCUTANEOUS
  Administered 2023-07-22: 4 [IU] via SUBCUTANEOUS

## 2023-07-22 MED ORDER — ALBUTEROL SULFATE (2.5 MG/3ML) 0.083% IN NEBU
2.5000 mg | INHALATION_SOLUTION | Freq: Three times a day (TID) | RESPIRATORY_TRACT | Status: DC
  Administered 2023-07-23 (×2): 2.5 mg via RESPIRATORY_TRACT
  Filled 2023-07-22 (×3): qty 3

## 2023-07-22 NOTE — Progress Notes (Signed)
Patient extubated at 1254 per MD order with RN at bedside. Patient tolerated well and placed on 4L nasal cannula. Vitals stable throughout. Patient able to cough and vocalize.

## 2023-07-22 NOTE — Progress Notes (Signed)
Passed SBT earlier today.  Successfully extubated.  Was initially slightly tachycardic but was also on norepinephrine we have now decreased that and tachycardia/rate better controlled. He is breathing comfortably on room air He is able to verbalize some but appears to have some degree of expressive aphasia  Plan Continue supportive measures Wean norepinephrine for mean arterial pressure greater than 65 Wean Precedex to off as tolerated Continue CRRT Continue antibiotics Should he continue to improve we can make referral to tertiary care for mitral valve replacement   [name] ACNP-BC [facility] Pulmonary/Critical Care Pager # [phone] OR # [phone] if no answer

## 2023-07-22 NOTE — Progress Notes (Signed)
OT Cancellation Note  Patient Details Name: [name] MRN: [id number] DOB: 08-30-1981   Cancelled Treatment:    Reason Eval/Treat Not Completed: Medical issues which prohibited therapy. Remains intubated, on CRRT in restraints. RN requesting hold.   [name], OTR/L [facility] Acute Rehabilitation Office: [phone]   [name] 07/22/2023, 10:42 AM

## 2023-07-22 NOTE — Progress Notes (Signed)
NAME:  [name], MRN:  [id number], DOB:  07-07-1981, LOS: 12 ADMISSION DATE:  07/09/2023, CONSULTATION DATE:  07/10/2023 REFERRING MD:  Suezanne Jacquet, CHIEF COMPLAINT:  DKA, AMS   History of Present Illness:  Patient is a 42 yo M w/ pertinent PMH ESRD on HD MWF, IDDM, gastroparesis, left AKA, HTN presents to [facility] on 10/11 w/ AMS.   Patient recently admitted to [facility] on 7/12 sepsis from right diabetic foot ulcer. Cultures w/ MRSA. Patient had dialysis catheter removed and replaced. ID treated w/ vanc. Patient refused echo and left on 7/17 against medical advise.    On 10/11 patient was found by family member on ground altered. EMS arrived and dialysis catheter partially removed. Patient more alert for them. Has been complaining of diarrhea x1 week per family. Unsure if patient went to dialysis today. Transferred to [facility] ED. On arrival bp stable 107/70 and afebrile. Patient remains confused and occasionally with agitation. Given dilaudid. CBG 497. Beta H >8. VBG ph 7.06, 16, 54, 4.7. Given iv fluids and started on insulin drip for DKA. CT head negative for acute abnormality. Cxr no significant findings. CT abd/pelvis no acute findings; possible steatosis vs. Liver mass 7 x 4 cm recommending f/u w/ MRI. WBC 20.7 and LA 2.4. Sepsis protocol initiated. Cultures obtained and started on vanc/zosyn. Given ams, dka, and soft bp pccm consulted for icu admission.   10/21 pccm called overnight to assess for worsening hypoxia and AMS after vomiting. On NRB w/ sats 70-80s. Hypoglycemic given dextrose which improved his mental status some. Transferred to 2H icu for intubation.     Pertinent  Medical History  ESRD on HD, HTN, diabetes, diabetic gastroparesis, cellulitis of left foot, left AKA  Significant Hospital Events: Including procedures, antibiotic start and stop dates in addition to other pertinent events   10/12: altered mental status, DKA 10/12: blood cultures positive for MRSA 10/21: resp failure  requiring intubation 10/23: per nursing less responsive than yesterday, increased ETT secretions. Clotting off CRRT. Ct head showing expected evolution of the right MCA stroke.  10/24 agitated when sedation decreased, is purposeful but does not follow commands. Tolerating UF gaols of -150. Still on NE infusion   Interim History / Subjective:  Agitated at times  Objective   Blood pressure (!) 86/73, pulse (!) 101, temperature 98.8 F (37.1 C), temperature source Axillary, resp. rate 18, height 5\' 7"  (1.702 m), weight 69.3 kg, SpO2 96%.    Vent Mode: PRVC FiO2 (%):  [40 %] 40 % Set Rate:  [18 bmp] 18 bmp Vt Set:  [520 mL] 520 mL PEEP:  [5 cmH20-8 cmH20] 8 cmH20 Pressure Support:  [10 cmH20] 10 cmH20 Plateau Pressure:  [21 cmH20-23 cmH20] 21 cmH20   Intake/Output Summary (Last 24 hours) at 07/22/2023 0957 Last data filed at 07/22/2023 0900 Gross per 24 hour  Intake 3056.61 ml  Output 5000.1 ml  Net -1943.49 ml   Filed Weights   07/20/23 0400 07/21/23 0530 07/22/23 0600  Weight: 70.2 kg 71.2 kg 69.3 kg    Examination: General critically ill appearing. Sedated on fent and precedex HENT NCAT orally intubated Pulm scattered rhonchi equal chest rise. CXR w/ stable aeration and no sig change in cavitary lesion of LUL Card RRR Abd soft Ext warm no side edema. Has left fem tunneled cath,prior left AKA  Derm old healed lesions RLA Neuro sedated on precedex and fent but will open eyes. Follows commands on right side. Left sided hemiparesis   Resolved Hospital Problem  list   DKA  Septic shock  Assessment & Plan:  Acute hypoxic respiratory failure; due to aspiration PNA, evolving cavitary PNA and mucous plugging  -cxr has been stable.  -LUL cavitary lesion unchanged.  -last sputum Normal flora  -agitation has been major barrier to weaning Plan Cont full vent support for now Attempting SBT on precedex. May need to extubate him on the precedex w/ fent off.  Cont volume removal via  CRRT Cont IV vanc (will needed extended course for both the cavitary PNA and the endocarditis)  Septic shock due to Mitral valve endocarditis w/ MRSA bacteremia  - wbc slowly improving, I think sedation may also be contributing to his pressor needs.  - TEE 10/22 with EF 35%, LV global hypokinesis. 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    End-stage renal disease on hemodialysis Plan CRRT per nephro  Serial chems  Now has heparin in system to help reduce filter clott  Anemia/thrombocytopenia Hgb down 1 g since yesterday, plts down  Plan Repeat CBC, prob needs xfusion   Incidental finding of Liver steatosis vs. Liver mass -CT abd/pelvis showing 7 x 4 cm density in anterior liver  Plan: F/u out pt MRI   Best Practice (right click and "Reselect all SmartList Selections" daily)   Diet/type: tubefeeds DVT prophylaxis: other GI prophylaxis: H2B Lines: Central line, Dialysis Catheter, Arterial Line, and yes and it is still needed Foley:  Yes, and it is still needed Code Status:  full code Last date of  multidisciplinary goals of care discussion [10/23: updated mom at bedside on plan of care]   Critical care time: 33 min     Simonne Martinet ACNP-BC [facility] Pulmonary/Critical Care Pager # [phone] OR # [phone] if no answer

## 2023-07-22 NOTE — Progress Notes (Signed)
PT Cancellation Note  Patient Details Name: KIMANI TITONE MRN: [id number] DOB: 06-04-1981   Cancelled Treatment:    Reason Eval/Treat Not Completed: Medical issues which prohibited therapy - intubated, in restraints, on femoral CRRT, RN requests PT hold this date.   Marye Round, PT DPT Acute Rehabilitation Services Secure Chat Preferred  Office [phone]     Kasarah Sitts Sheliah Plane 07/22/2023, 10:45 AM

## 2023-07-22 NOTE — Progress Notes (Signed)
[facility] NEPHROLOGY PROGRESS NOTE  Assessment/ Plan:  MRSA bacteremia with MV endocarditis -s/p line holiday, TDC placed by IR 10/18 -on vanc and cefepime -CTS following-high risk for surgery, may need transfer to higher level of care to consider surgery but will need to be extubated first  Acute ischemic CVA/septic infarct 10/17 -neuro signed off, per primary service  AHRF -possibly secondary to aspiration. Intubated 10/21 -per PCCM -will cont to UF as tolerated with renal replacement therapy  Shock -pressor support per primary serivce  ESRD -typically on HD MWF. -given ongoing shock, started CRRT 10/22. UF goals 100-150cc/hr as tolerated. Heparin through circuit  Dialysis access - Per IR note, occluded both right and left subclavian on venography therefore successful placement of left femoral TDC with the tip of the catheter in the infrarenal IVC.  HTN/volume - UF as tolerated. On pressors as above. 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 Transfuse PRN for hgb <7. On ESA  CKD MBD-  calcitriol per tube. Resume sensipar when taking PO but will see how is calcium is doing first. Replete PO4 PRN, holding binders given hypophos  OP HD: MWF [facility] 4h  400/1.5    77kg   2/3 bath  [facility] TDC    Heparin none - last OP HD 10/7, post wt 74.6kg - coming off 75- 76kg - misses HD once every other week approx - rocaltrol 1.50 mcg  - no esa  Discussed with patient's mother in room.  Shane Sar, MD [facility]  Subjective:  Seen and examined in ICU and on CRRT. Discussed with mother in room. No acute events overnight. Pressors: levo Net pos ~1.8L.  Objective Vital signs in last 24 hours: Vitals:   07/22/23 0600 07/22/23 0749 07/22/23 0751 07/22/23 0800  BP:      Pulse:  100  (!) 101  Resp:    18  Temp:   98.8 F (37.1 C)   TempSrc:   Axillary   SpO2:   100% 100% 96%  Weight: 69.3 kg     Height:       Weight change: -1.9 kg  Intake/Output Summary (Last 24 hours) at 07/22/2023 0846 Last data filed at 07/22/2023 0800 Gross per 24 hour  Intake 2922.39 ml  Output 4913.1 ml  Net -1990.71 ml       Labs: RENAL PANEL Recent Labs  Lab 07/19/23 0434 07/19/23 0958 07/20/23 0610 07/20/23 1638 07/21/23 0327 07/21/23 0341 07/21/23 2106 07/22/23 0437 07/22/23 0444  NA 134* 132* 131* 130* 131* 132* 133* 132* 135  K 4.1 4.3 4.4 4.0 3.7 3.8 3.9 4.1 3.9  CL 92* 94* 91* 95* 98  --  101 101  --   CO2 22 21* 22 20* 23  --  23 23  --   GLUCOSE 106* 160* 167* 262* 246*  --  239* 305*  --   BUN 39* 41* 47* 50* 35*  --  26* 23*  --   CREATININE 9.46* 10.00* 10.96* 10.24* 6.46*  --  4.32* 3.49*  --   CALCIUM 8.6* 7.5* 7.7* 6.9* 7.3*  --  7.7* 7.9*  --   MG 2.7*  --  2.6*  --  2.5*  --   --  2.6*  --   PHOS 4.7*  --  5.5* 5.1* 3.4  --  2.0* 2.2*  --   ALBUMIN  --  2.2*  --  1.7* 1.6*  --  1.6* 1.7*  --     Liver Function Tests: Recent Labs  Lab 07/19/23 0958 07/20/23 1638 07/21/23 0327 07/21/23 2106 07/22/23 0437  AST 22  --   --   --   --   ALT 7  --   --   --   --   ALKPHOS 169*  --   --   --   --   BILITOT 1.3*  --   --   --   --   PROT 7.4  --   --   --   --   ALBUMIN 2.2*   < > 1.6* 1.6* 1.7*   < > = values in this interval not displayed.   No results for input(s): "LIPASE", "AMYLASE" in the last 168 hours. No results for input(s): "AMMONIA" in the last 168 hours.  CBC: Recent Labs    07/20/23 0349 07/21/23 0329 07/21/23 0341 07/22/23 0438 07/22/23 0444  HGB 11.1* 8.1* 9.9* 7.1* 8.5*  MCV 88.7 90.5  --  92.3  --     Cardiac Enzymes: No results for input(s): "CKTOTAL", "CKMB", "CKMBINDEX", "TROPONINI" in the last 168 hours. CBG: Recent Labs  Lab 07/21/23 1143 07/21/23 1613 07/21/23 1959 07/21/23 2353 07/22/23 0306  GLUCAP 309* 277* 172* 285* 278*    Iron Studies: No results for input(s): "IRON", "TIBC",  "TRANSFERRIN", "FERRITIN" in the last 72 hours. Studies/Results: CT HEAD WO CONTRAST ( )  Result Date: 07/22/2023 CLINICAL DATA:  Altered mental status and concern for septic emboli. EXAM: CT HEAD WITHOUT CONTRAST TECHNIQUE: Contiguous axial images were obtained from the base of the skull through the vertex without intravenous contrast. RADIATION DOSE REDUCTION: This exam was performed according to the departmental dose-optimization program which includes automated exposure control, adjustment of the mA and/or kV according to patient size and/or use of iterative reconstruction technique. COMPARISON:  07/09/2023 head CT and 07/15/2023 brain MRI FINDINGS: Brain: Expected evolution of cytotoxic edema at the site of right MCA territory infarct. No acute hemorrhage. No midline shift or other mass effect. Vascular: Focal calcification within a M2 branch of the right MCA, likely calcified thromboembolism. Skull: The visualized skull base, calvarium and extracranial soft tissues are normal. Sinuses/Orbits: Opacification of the left maxillary sinus. The orbits are normal. No mastoid or middle ear effusion. IMPRESSION: 1. Expected evolution of right MCA territory infarct. No acute hemorrhage. Electronically Signed   By: Deatra Robinson M.D.   On: 07/22/2023 03:51   DG CHEST PORT 1 VIEW  Result Date: 07/21/2023 CLINICAL DATA:  42 year old male respiratory distress.  Intubated. EXAM: PORTABLE CHEST 1 VIEW COMPARISON:  Portable chest 07/19/2023 and earlier. FINDINGS: Portable AP supine view at talar 9 hours. Enteric tube tip in good position between the clavicles and carina. Enteric tube side hole at the level of the gastric body. Left axillary vascular stent again noted. There is a thick-walled cavitary lesion projecting in the lateral left upper lobe at the level of the hilum, progressed since 07/10/2023. This is 3-4 cm. No superimposed pneumothorax, pulmonary edema, pleural effusion or other confluent opacity.  IMPRESSION: 1. Satisfactory ET tube and enteric tube. 2. A 3-4 cm thick walled round and cavitary left mid lung lesion has progressed since 07/10/2023. Consider pulmonary abscess, necrotizing pneumonia. No other confluent lung opacity. Electronically Signed   By: Odessa Fleming M.D.   On: 07/21/2023 12:28   ECHO TEE  Result Date: 07/20/2023    TRANSESOPHOGEAL ECHO REPORT   Patient Name:  [name] Date of Exam: 07/20/2023 Medical Rec #:  [id number]     Height:       67.0 in Accession #:    [id number]    Weight:       154.8 lb Date of Birth:  December 17, 1980    BSA:          1.813 m Patient Age:    41 years      BP:           123/63 mmHg Patient Gender: M             HR:           89 bpm. Exam Location:  Inpatient Procedure: Transesophageal Echo, 3D Echo, Color Doppler and Cardiac Doppler Indications:     Endocarditis  History:         Patient has prior history of Echocardiogram examinations, most                  recent 07/14/2023. CHF; Risk Factors:Hypertension and Diabetes.  Sonographer:     Irving Burton Senior RDCS Referring Phys:  [phone] MIHAI CROITORU Diagnosing Phys: Thurmon Fair MD PROCEDURE: After discussion of the risks and benefits of a TEE, an informed consent was obtained from the patient. The transesophogeal probe was passed without difficulty through the esophogus of the patient. Sedation performed by performing physician. The patient developed no complications during the procedure.  IMPRESSIONS  1. Left ventricular ejection fraction, by estimation, is 35%. The left ventricle has moderately decreased function. The left ventricle demonstrates global hypokinesis. There is mild concentric left ventricular hypertrophy.  2. Right ventricular systolic function is normal. The right ventricular size is normal. There is mildly elevated pulmonary artery systolic pressure.  3. No left atrial/left atrial appendage thrombus was detected.  4. There is a very large vegetation broadly attached to the lateral (P1) scallop of  the posterior mitral leaflet. It measures up to 3.9 cm in length and 1 cm in width and is highly mobile, prolapsing deep in the LV in diastole and reaching the posterior  left atrial wall in systole. There is an irregular perforation of the P1 scallop with a diameter of at least 4 mm with moderate -severe insufficiency. By both 2D and 3D PISA based measurements the effective regurgitant orifice area is 0.35 cm sq, regurgitant volume 55 ml, regurgitant fraction 60%. The mitral valve is abnormal. Moderate to severe mitral valve regurgitation. No evidence of mitral stenosis.  5. Tricuspid valve regurgitation is mild to moderate.  6. The right coronary cusp is disproportionately affected by degenerative changes. No mobile vegetations a re seen. The aortic valve is tricuspid. There is mild calcification of the aortic valve. There is mild thickening of the aortic valve. Aortic valve regurgitation is trivial. Aortic valve sclerosis/calcification is present, without any evidence of aortic stenosis.  7. There is mild (Grade II) atheroma plaque involving the ascending aorta.  8. There is brief systolic reversal of flow only in the right upper pulmonary vein. There is antegrade systolic flow in the left upper pulmonary vein. FINDINGS  Left Ventricle: Left ventricular ejection fraction, by estimation, is 35%. The left ventricle has moderately decreased function. The left ventricle demonstrates global hypokinesis. The left ventricular internal cavity size was normal in size. There is mild concentric left ventricular hypertrophy. Right Ventricle: The right ventricular size is normal. No increase in right ventricular wall thickness. Right ventricular systolic function is normal. There is mildly elevated pulmonary artery systolic pressure. The tricuspid  regurgitant velocity is 2.59  m/s, and with an assumed right atrial pressure of 10 mmHg, the estimated right ventricular systolic pressure is 36.8 mmHg. Left Atrium: Left atrial  size was normal in size. No left atrial/left atrial appendage thrombus was detected. Right Atrium: Right atrial size was normal in size. Pericardium: There is no evidence of pericardial effusion. 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 It measures up to 3.9 cm in length and 1 cm in width and is highly mobile, prolapsing deep in the LV in diastole and reaching the  posterior left atrial wall in systole. There is an irregular perforation of the P1 scallop with a diameter of at least 4 mm with moderate -severe insufficiency. By both 2D and 3D PISA based measurements the effective regurgitant orifice area is 0.35 cm sq, regurgitant volume 55 ml, regurgitant fraction 60%. The mitral valve is abnormal. Moderate to severe mitral valve regurgitation. No evidence of mitral valve stenosis. Tricuspid Valve: The tricuspid valve is normal in structure. Tricuspid valve regurgitation is mild to moderate. Aortic Valve: The right coronary cusp is disproportionately affected by degenerative changes. No mobile vegetations a re seen. The aortic valve is tricuspid. There is mild calcification of the aortic valve. There is mild thickening of the aortic valve. Aortic valve regurgitation is trivial. Aortic valve sclerosis/calcification is present, without any evidence of aortic stenosis. Pulmonic Valve: The pulmonic valve was grossly normal. Pulmonic valve regurgitation is trivial. Aorta: The aortic root, ascending aorta, aortic arch and descending aorta are all structurally normal, with no evidence of dilitation or obstruction. There is mild (Grade II) atheroma plaque involving the ascending aorta. Venous: There is brief systolic reversal of flow only in the right upper pulmonary vein. There is antegrade systolic flow in the left upper pulmonary vein. IVC assessment for right atrial pressure unable to be performed due to mechanical ventilation.  IAS/Shunts: No atrial level shunt detected by color flow Doppler. Additional Comments: Spectral Doppler performed. LEFT VENTRICLE PLAX 2D LVOT diam:     2.04 cm LV SV:         37 LV SV Index:   21 LVOT Area:     3.27 cm  AORTIC VALVE LVOT Vmax:   82.35 cm/s LVOT Vmean:  59.251 cm/s LVOT VTI:    0.114 m MR Peak grad:    186.0 mmHg   TRICUSPID VALVE MR Mean grad:    86.0 mmHg    TR Peak grad:   26.8 mmHg MR Vmax:         682.00 cm/s  TR Vmax:        259.00 cm/s MR Vmean:        423.0 cm/s MR PISA:         6.28 cm     SHUNTS MR PISA Eff ROA: 37 mm       Systemic VTI:  0.11 m MR PISA Radius:  1.00 cm      Systemic Diam: 2.04 cm Mihai Croitoru MD Electronically signed by Thurmon Fair MD Signature Date/Time: 07/20/2023/2:12:46 PM    Final     Medications: Infusions:  dexmedetomidine (PRECEDEX) IV infusion 0.9 mcg/kg/hr (07/22/23 0800)   feeding supplement (VITAL 1.5 CAL) 55 mL/hr at 07/22/23 0800   fentaNYL infusion INTRAVENOUS 100 mcg/hr (07/22/23 0800)   heparin 10,000 units/ 20 mL infusion syringe 1,300 Units/hr (07/22/23 0610)   norepinephrine (LEVOPHED) Adult infusion 9 mcg/min (07/22/23 0800)   prismasol  BGK 2/2.5 replacement solution 400 mL/hr at 07/22/23 0301   prismasol BGK 2/2.5 replacement solution 500 mL/hr at 07/22/23 0039   prismasol BGK 4/2.5 1,500 mL/hr at 07/22/23 0726   vancomycin 750 mg (07/22/23 0838)    Scheduled Medications:  albuterol  2.5 mg Nebulization Q4H   calcitRIOL  1.5 mcg Per Tube Q M,W,F   Chlorhexidine Gluconate Cloth  6 each Topical Q0600   darbepoetin (ARANESP) injection - DIALYSIS  40 mcg Subcutaneous Q Fri-1800   famotidine  10 mg Per Tube Daily   feeding supplement (PROSource TF20)  60 mL Per Tube TID   fiber supplement (BANATROL TF)  60 mL Per Tube BID   gabapentin  100 mg Per Tube Q8H   heparin injection (subcutaneous)  5,000 Units Subcutaneous Q8H   heparin sodium (porcine)  4,600 Units Intracatheter Once   insulin aspart  0-9 Units Subcutaneous Q4H    multivitamin  1 tablet Per Tube BID   mouth rinse  15 mL Mouth Rinse Q2H   thiamine  100 mg Per Tube Daily    have reviewed scheduled and prn medications.  Physical Exam: General: ill appearing, intubated Heart:RRR, +systolic murmur Lungs: intubated, b/l chest expansion, cta bl Abdomen:soft, Non-tender, non-distended Extremities:No edema, left aka Neuro: opening eyes and tracking but not following any commands for me Dialysis Access: left femoral TDC  Shantrice Rodenberg 07/22/2023,8:46 AM  LOS: 12 days

## 2023-07-22 NOTE — Plan of Care (Signed)
  Problem: Self-Care: Goal: Ability to participate in self-care as condition permits will improve Outcome: Progressing Goal: Ability to communicate needs accurately will improve Outcome: Progressing   Problem: Nutrition: Goal: Risk of aspiration will decrease Outcome: Progressing   Problem: Education: Goal: Knowledge of disease or condition will improve Outcome: Progressing Goal: Knowledge of secondary prevention will improve (MUST DOCUMENT ALL) Outcome: Progressing Goal: Knowledge of patient specific risk factors will improve Loraine Leriche N/A or DELETE if not current risk factor) Outcome: Progressing   Problem: Ischemic Stroke/TIA Tissue Perfusion: Goal: Complications of ischemic stroke/TIA will be minimized Outcome: Progressing   Problem: Coping: Goal: Will identify appropriate support needs Outcome: Progressing   Problem: Health Behavior/Discharge Planning: Goal: Goals will be collaboratively established with patient/family Outcome: Progressing   Problem: Self-Care: Goal: Ability to participate in self-care as condition permits will improve Outcome: Progressing Goal: Ability to communicate needs accurately will improve Outcome: Progressing   Problem: Nutrition: Goal: Risk of aspiration will decrease Outcome: Progressing Goal: Dietary intake will improve Outcome: Progressing   Problem: Safety: Goal: Non-violent Restraint(s) Outcome: Progressing

## 2023-07-22 NOTE — Progress Notes (Signed)
Resting comfortably No distress Still on NE infusion and requiring precedex for agitation Plan Cont to slowly wean dex May need to add low dose seroquel for delirium if still on dex in am I hope that as we wean the dex his BP needs will improve.  Still hard to believe he would be a good surgical candidate.   Simonne Martinet ACNP-BC [facility] Pulmonary/Critical Care Pager # [phone] OR # [phone] if no answer

## 2023-07-22 NOTE — Progress Notes (Signed)
Patient Name: DEIONTE DELLEDONNE Date of Encounter: 07/22/2023 [facility] Cardiologist: None   Interval Summary  .    Substantially more alert this morning.  Seems to have some purposeful movements although it is not clear that he is following commands.  He makes eye contact. Remains on the ventilator and has thick secretions.  Making some progress with weaning. Remains on relatively low-dose intravenous norepinephrine. CT of the head does not show evidence of new stroke or intracranial hemorrhage.   Chest x-ray shows progression of the left lung cavitary lesion consistent with septic pulmonary embolism. WBC remains markedly elevated but continues downward trend. Has developed liquid diarrhea again (note C. difficile antigen positive/toxin negative 07/10/2023).  Vital Signs .    Vitals:   07/22/23 0600 07/22/23 0749 07/22/23 0751 07/22/23 0800  BP:      Pulse:  100  (!) 101  Resp:    18  Temp:   98.8 F (37.1 C)   TempSrc:   Axillary   SpO2:  100% 100% 96%  Weight: 69.3 kg     Height:        Intake/Output Summary (Last 24 hours) at 07/22/2023 0827 Last data filed at 07/22/2023 0800 Gross per 24 hour  Intake 2922.39 ml  Output 4948.1 ml  Net -2025.71 ml      07/22/2023    6:00 AM 07/21/2023    5:30 AM 07/20/2023    4:00 AM  Last 3 Weights  Weight (lbs) 152 lb 12.5 oz 156 lb 15.5 oz 154 lb 12.2 oz  Weight (kg) 69.3 kg 71.2 kg 70.2 kg      Telemetry/ECG    Sinus rhythm/sinus tachycardia- Personally Reviewed  Physical Exam .   GEN: Mildly agitated.  Flaccid left hemiparesis Neck: No JVD Cardiac: RRR, 1-2/6 apical holosystolic murmur radiating to axilla, no diastolic murmurs, rubs, or gallops.  Respiratory: Clear to auscultation bilaterally. GI: Soft, nontender, non-distended  MS: No edema.  Left AKA  Assessment & Plan .    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 No evidence of new stroke or intracranial hemorrhage on head CT. It appears likely that his sudden decompensation on 07/20/2023 may have been due to abrupt leaflet perforation and acute mitral regurgitation.  Currently chest x-ray does not look like pulmonary edema and he seems to be adjusting to the acute valvular abnormality. Rapidly decreasing WBC does suggest that antibiotics may be helping, but it is highly unlikely that endocarditis will be cured with antibiotics alone and he will be left with a perforated leaflet regardless. Very high surgical risk.  Will wait to see how with today's attempts to wean the ventilator and extubated go, before approaching a tertiary Medical Center to discuss transfer for high risk mitral valve surgery. Prognosis remains grim.  CRITICAL CARE Performed by: Rachelle Hora Khiry Pasquariello   Total critical care time: 35 minutes  Critical care time was exclusive of separately billable procedures and treating other patients.  Critical care was necessary to treat or prevent imminent or life-threatening deterioration.  Critical care was time spent personally by me on the following activities: development of treatment plan with patient and/or surrogate as well  as nursing, discussions with consultants, evaluation of patient's response to treatment, examination of patient, obtaining history from patient or surrogate, ordering and performing treatments and interventions, ordering and review of laboratory studies, ordering and review of radiographic studies, pulse oximetry and re-evaluation of patient's condition.  For  questions or updates, please contact [facility] Please consult [email or web address] for contact info under        Signed, Thurmon Fair, MD

## 2023-07-23 ENCOUNTER — Inpatient Hospital Stay (HOSPITAL_COMMUNITY): Payer: Medicare HMO

## 2023-07-23 DIAGNOSIS — I340 Nonrheumatic mitral (valve) insufficiency: Secondary | ICD-10-CM | POA: Diagnosis not present

## 2023-07-23 DIAGNOSIS — A4102 Sepsis due to Methicillin resistant Staphylococcus aureus: Secondary | ICD-10-CM | POA: Diagnosis not present

## 2023-07-23 DIAGNOSIS — Z89612 Acquired absence of left leg above knee: Secondary | ICD-10-CM | POA: Diagnosis not present

## 2023-07-23 DIAGNOSIS — E1022 Type 1 diabetes mellitus with diabetic chronic kidney disease: Secondary | ICD-10-CM | POA: Diagnosis not present

## 2023-07-23 DIAGNOSIS — I059 Rheumatic mitral valve disease, unspecified: Secondary | ICD-10-CM | POA: Diagnosis not present

## 2023-07-23 LAB — POCT ACTIVATED CLOTTING TIME
Activated Clotting Time: 207 s
Activated Clotting Time: 214 s
Activated Clotting Time: 226 s
Activated Clotting Time: 214 s
Activated Clotting Time: 220 s
Activated Clotting Time: 232 s
Activated Clotting Time: 232 s
Activated Clotting Time: 214 s
Activated Clotting Time: >1000 s
Activated Clotting Time: >1000 s
Activated Clotting Time: >1000 s
Activated Clotting Time: >1000 s
Activated Clotting Time: 189 s

## 2023-07-23 LAB — RENAL FUNCTION PANEL
Albumin: 1.8 g/dL — ABNORMAL LOW (ref 3.5–5.0)
Albumin: 1.8 g/dL — ABNORMAL LOW (ref 3.5–5.0)
Anion gap: 10 (ref 5–15)
Anion gap: 13 (ref 5–15)
BUN: 17 mg/dL (ref 6–20)
BUN: 18 mg/dL (ref 6–20)
CO2: 23 mmol/L (ref 22–32)
CO2: 18 mmol/L — ABNORMAL LOW (ref 22–32)
Calcium: 8.4 mg/dL — ABNORMAL LOW (ref 8.9–10.3)
Calcium: 8.2 mg/dL — ABNORMAL LOW (ref 8.9–10.3)
Chloride: 101 mmol/L (ref 98–111)
Chloride: 101 mmol/L (ref 98–111)
Creatinine, Ser: 2.19 mg/dL — ABNORMAL HIGH (ref 0.61–1.24)
Creatinine, Ser: 2.09 mg/dL — ABNORMAL HIGH (ref 0.61–1.24)
GFR, Estimated: 37 mL/min — ABNORMAL LOW (ref >60)
GFR, Estimated: 40 mL/min — ABNORMAL LOW (ref >60)
Glucose, Bld: 156 mg/dL — ABNORMAL HIGH (ref 70–99)
Glucose, Bld: 165 mg/dL — ABNORMAL HIGH (ref 70–99)
Phosphorus: 2.5 mg/dL (ref 2.5–4.6)
Phosphorus: 2.7 mg/dL (ref 2.5–4.6)
Potassium: 4.3 mmol/L (ref 3.5–5.1)
Potassium: 4.1 mmol/L (ref 3.5–5.1)
Sodium: 134 mmol/L — ABNORMAL LOW (ref 135–145)
Sodium: 132 mmol/L — ABNORMAL LOW (ref 135–145)

## 2023-07-23 LAB — POCT I-STAT 7, (LYTES, BLD GAS, ICA,H+H)
Acid-base deficit: 4 mmol/L — ABNORMAL HIGH (ref 0.0–2.0)
Bicarbonate: 19.4 mmol/L — ABNORMAL LOW (ref 20.0–28.0)
Calcium, Ion: 1.12 mmol/L — ABNORMAL LOW (ref 1.15–1.40)
HCT: 22 % — ABNORMAL LOW (ref 39.0–52.0)
Hemoglobin: 7.5 g/dL — ABNORMAL LOW (ref 13.0–17.0)
O2 Saturation: 96 %
Patient temperature: 98.7
Potassium: 4.5 mmol/L (ref 3.5–5.1)
Sodium: 131 mmol/L — ABNORMAL LOW (ref 135–145)
TCO2: 20 mmol/L — ABNORMAL LOW (ref 22–32)
pCO2 arterial: 28.5 mm[Hg] — ABNORMAL LOW (ref 32–48)
pH, Arterial: 7.441 (ref 7.35–7.45)
pO2, Arterial: 80 mm[Hg] — ABNORMAL LOW (ref 83–108)

## 2023-07-23 LAB — LACTIC ACID, PLASMA
Lactic Acid, Venous: 2.5 mmol/L (ref 0.5–1.9)
Lactic Acid, Venous: 3.7 mmol/L (ref 0.5–1.9)

## 2023-07-23 LAB — CBC
HCT: 21 % — ABNORMAL LOW (ref 39.0–52.0)
HCT: 24.6 % — ABNORMAL LOW (ref 39.0–52.0)
Hemoglobin: 6 g/dL — CL (ref 13.0–17.0)
Hemoglobin: 7.4 g/dL — ABNORMAL LOW (ref 13.0–17.0)
MCH: 27.4 pg (ref 26.0–34.0)
MCH: 28.2 pg (ref 26.0–34.0)
MCHC: 28.6 g/dL — ABNORMAL LOW (ref 30.0–36.0)
MCHC: 30.1 g/dL (ref 30.0–36.0)
MCV: 95.9 fL (ref 80.0–100.0)
MCV: 93.9 fL (ref 80.0–100.0)
Platelets: 141 10*3/uL — ABNORMAL LOW (ref 150–400)
Platelets: 138 10*3/uL — ABNORMAL LOW (ref 150–400)
RBC: 2.19 MIL/uL — ABNORMAL LOW (ref 4.22–5.81)
RBC: 2.62 MIL/uL — ABNORMAL LOW (ref 4.22–5.81)
RDW: 16.2 % — ABNORMAL HIGH (ref 11.5–15.5)
RDW: 15.7 % — ABNORMAL HIGH (ref 11.5–15.5)
WBC: 25.9 10*3/uL — ABNORMAL HIGH (ref 4.0–10.5)
WBC: 33.2 10*3/uL — ABNORMAL HIGH (ref 4.0–10.5)
nRBC: 0 % (ref 0.0–0.2)
nRBC: 0 % (ref 0.0–0.2)

## 2023-07-23 LAB — GLUCOSE, CAPILLARY
Glucose-Capillary: 109 mg/dL — ABNORMAL HIGH (ref 70–99)
Glucose-Capillary: 149 mg/dL — ABNORMAL HIGH (ref 70–99)
Glucose-Capillary: 166 mg/dL — ABNORMAL HIGH (ref 70–99)
Glucose-Capillary: 155 mg/dL — ABNORMAL HIGH (ref 70–99)
Glucose-Capillary: 176 mg/dL — ABNORMAL HIGH (ref 70–99)
Glucose-Capillary: 334 mg/dL — ABNORMAL HIGH (ref 70–99)
Glucose-Capillary: 297 mg/dL — ABNORMAL HIGH (ref 70–99)

## 2023-07-23 LAB — TROPONIN I (HIGH SENSITIVITY): Troponin I (High Sensitivity): 124 ng/L (ref <18)

## 2023-07-23 LAB — PREPARE RBC (CROSSMATCH)

## 2023-07-23 LAB — APTT
aPTT: 120 s — ABNORMAL HIGH (ref 24–36)
aPTT: 150 s — ABNORMAL HIGH (ref 24–36)
aPTT: 41 s — ABNORMAL HIGH (ref 24–36)

## 2023-07-23 LAB — CG4 I-STAT (LACTIC ACID): Lactic Acid, Venous: 2 mmol/L (ref 0.5–1.9)

## 2023-07-23 LAB — MAGNESIUM: Magnesium: 2.5 mg/dL — ABNORMAL HIGH (ref 1.7–2.4)

## 2023-07-23 LAB — BRAIN NATRIURETIC PEPTIDE: B Natriuretic Peptide: 125.8 pg/mL — ABNORMAL HIGH (ref 0.0–100.0)

## 2023-07-23 MED ORDER — INSULIN ASPART 100 UNIT/ML IJ SOLN
1.0000 [IU] | INTRAMUSCULAR | Status: DC
  Administered 2023-07-23: 1 [IU] via SUBCUTANEOUS
  Administered 2023-07-23 (×3): 2 [IU] via SUBCUTANEOUS

## 2023-07-23 MED ORDER — SODIUM CHLORIDE 0.9 % IV BOLUS
500.0000 mL | Freq: Once | INTRAVENOUS | Status: AC
  Administered 2023-07-23: 500 mL via INTRAVENOUS

## 2023-07-23 MED ORDER — SODIUM CHLORIDE 0.9 % IV SOLN
2.0000 g | Freq: Two times a day (BID) | INTRAVENOUS | Status: DC
  Administered 2023-07-23 – 2023-07-27 (×8): 2 g via INTRAVENOUS
  Filled 2023-07-23 (×8): qty 12.5

## 2023-07-23 MED ORDER — OLANZAPINE 10 MG IM SOLR
5.0000 mg | Freq: Once | INTRAMUSCULAR | Status: AC | PRN
  Administered 2023-07-23: 5 mg via INTRAMUSCULAR
  Filled 2023-07-23: qty 10

## 2023-07-23 MED ORDER — HALOPERIDOL LACTATE 5 MG/ML IJ SOLN: 2.0000 mg | Freq: Once | INTRAMUSCULAR | Status: DC | PRN

## 2023-07-23 MED ORDER — ALBUTEROL SULFATE (2.5 MG/3ML) 0.083% IN NEBU: 2.5000 mg | INHALATION_SOLUTION | Freq: Four times a day (QID) | RESPIRATORY_TRACT | Status: DC | PRN

## 2023-07-23 MED ORDER — HEPARIN SODIUM (PORCINE) 5000 UNIT/ML IJ SOLN
350.0000 [IU]/h | INTRAMUSCULAR | Status: DC
  Administered 2023-07-23: 350 [IU]/h via INTRAVENOUS_CENTRAL
  Administered 2023-07-24: 750 [IU]/h via INTRAVENOUS_CENTRAL
  Filled 2023-07-23 (×2): qty 2
  Filled 2023-07-23: qty 10000

## 2023-07-23 MED ORDER — OSMOLITE 1.5 CAL PO LIQD
1000.0000 mL | ORAL | Status: DC
  Administered 2023-07-23 – 2023-07-27 (×6): 1000 mL

## 2023-07-23 MED ORDER — HEPARIN SODIUM (PORCINE) 1000 UNIT/ML DIALYSIS
1000.0000 [IU] | INTRAMUSCULAR | Status: DC | PRN
  Filled 2023-07-23 (×2): qty 4

## 2023-07-23 MED ORDER — STERILE WATER FOR INJECTION IJ SOLN
INTRAMUSCULAR | Status: AC
  Administered 2023-07-23: 10 mL
  Filled 2023-07-23: qty 10

## 2023-07-23 MED ORDER — VASOPRESSIN 20 UNITS/100 ML INFUSION FOR SHOCK
0.0000 [IU]/min | INTRAVENOUS | Status: DC
Start: 2023-07-23 — End: 2023-07-26
  Administered 2023-07-23 – 2023-07-26 (×7): 0.03 [IU]/min via INTRAVENOUS
  Filled 2023-07-23 (×7): qty 100

## 2023-07-23 MED ORDER — HALOPERIDOL LACTATE 5 MG/ML IJ SOLN
1.0000 mg | Freq: Once | INTRAMUSCULAR | Status: AC
  Administered 2023-07-23: 1 mg via INTRAVENOUS
  Filled 2023-07-23: qty 1

## 2023-07-23 MED ORDER — QUETIAPINE FUMARATE 25 MG PO TABS
25.0000 mg | ORAL_TABLET | Freq: Two times a day (BID) | ORAL | Status: DC
  Administered 2023-07-23 – 2023-07-26 (×7): 25 mg
  Filled 2023-07-23 (×7): qty 1

## 2023-07-23 MED ORDER — INSULIN ASPART 100 UNIT/ML IJ SOLN
2.0000 [IU] | INTRAMUSCULAR | Status: DC
  Administered 2023-07-23 (×2): 6 [IU] via SUBCUTANEOUS
  Administered 2023-07-24: 2 [IU] via SUBCUTANEOUS

## 2023-07-23 MED ORDER — PROSOURCE TF20 ENFIT COMPATIBL EN LIQD
60.0000 mL | Freq: Four times a day (QID) | ENTERAL | Status: DC
  Administered 2023-07-23 – 2023-07-28 (×19): 60 mL
  Filled 2023-07-23 (×19): qty 60

## 2023-07-23 MED ORDER — ALBUMIN HUMAN 5 % IV SOLN
25.0000 g | Freq: Once | INTRAVENOUS | Status: AC
  Administered 2023-07-23: 25 g via INTRAVENOUS
  Filled 2023-07-23: qty 500

## 2023-07-23 MED ORDER — HALOPERIDOL LACTATE 5 MG/ML IJ SOLN
2.0000 mg | INTRAMUSCULAR | Status: DC | PRN
  Administered 2023-07-23: 2 mg via INTRAVENOUS
  Filled 2023-07-23: qty 1

## 2023-07-23 MED ORDER — NOREPINEPHRINE BITARTRATE 1 MG/ML IV SOLN
0.0000 ug/min | INTRAVENOUS | Status: DC
  Administered 2023-07-23: 10 ug/min via INTRAVENOUS
  Administered 2023-07-23: 20 ug/min via INTRAVENOUS
  Filled 2023-07-23 (×3): qty 16

## 2023-07-23 MED ORDER — STERILE WATER FOR INJECTION IJ SOLN
INTRAMUSCULAR | Status: AC
  Filled 2023-07-23: qty 10

## 2023-07-23 NOTE — Evaluation (Signed)
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 Filetted toe flap metatarsal resection. Debridement Plantar Foot wound;  Surgeon: Park Liter, DPM;  Location: [facility] OR;  Service: Podiatry;  Laterality: Right;   AMPUTATION Left 08/20/2018   Procedure: FIFTH METATARSAL BONE BIOPSY;  Surgeon: Park Liter, DPM;  Location: [facility] OR;  Service: Podiatry;  Laterality: Left;   AMPUTATION Left 10/28/2018   Procedure: LEFT GREAT TOE AMPUTATION;  Surgeon: Park Liter, DPM;  Location: [facility] OR;  Service: Podiatry;  Laterality: Left;   AMPUTATION Left 09/16/2020   Procedure: AMPUTATION BELOW KNEE;  Surgeon: Larina Earthly, MD;  Location: [facility] OR;  Service: Vascular;  Laterality: Left;   AMPUTATION Left 10/15/2020   Procedure: AMPUTATION ABOVE KNEE;  Surgeon: Sherren Kerns, MD;  Location: [facility] OR;  Service: Vascular;  Laterality: Left;   APPLICATION OF WOUND VAC  02/02/2018   Procedure: APPLICATION OF WOUND VAC  Right Foot;  Surgeon: Park Liter, DPM;  Location: [facility] OR;  Service: Podiatry;;   APPLICATION OF WOUND VAC Left 10/28/2018   Procedure: APPLICATION OF WOUND VAC LEFT TOE;  Surgeon: Park Liter,  DPM;  Location: [facility] OR;  Service: Podiatry;  Laterality: Left;   APPLICATION OF WOUND VAC Left 11/01/2018   Procedure: APPLICATION OF WOUND VAC;  Surgeon: Park Liter, DPM;  Location: [facility] OR;  Service: Podiatry;  Laterality: Left;   AV FISTULA PLACEMENT     left arm.   AV FISTULA PLACEMENT Right 12/22/2016   Procedure: INSERTION OF ARTERIOVENOUS (AV) GORE-TEX GRAFT ARM;  Surgeon: Sherren Kerns, MD;  Location: [facility] OR;  Service: Vascular;  Laterality: Right;   AV FISTULA PLACEMENT Left 05/26/2018   Procedure: INSERTION OF  ARTERIOVENOUS (AV) GORE-TEX GRAFT LEFT ARM;  Surgeon: Nada Libman, MD;  Location: [facility] OR;  Service: Vascular;  Laterality: Left;   EYE SURGERY     I & D EXTREMITY Right 10/31/2017   Procedure: IRRIGATION AND DEBRIDEMENT RIGHT FOOT;  Surgeon: Park Liter, DPM;  Location: [facility] OR;  Service: Podiatry;  Laterality: Right;   I & D EXTREMITY Left 08/20/2018   Procedure: IRRIGATION AND DEBRIDEMENT EXTREMITY WITH SECONDARY WOUND CLOSUREAND APPLICATION OF WOUND VAC LEFT FOOT;  Surgeon: Park Liter, DPM;  [facility]: [facility] OR;  Service: Podiatry;  Laterality: Left;   I & D EXTREMITY Left 10/20/2018   Procedure: IRRIGATION AND DEBRIDEMENT LEFT FOOT  DEBRIDEMENT LATERAL FOOT WOUND;  Surgeon: Park Liter, DPM;  [facility];  Service: Podiatry;  Laterality: Left;   I & D EXTREMITY Left 10/28/2018   Procedure: IRRIGATION AND DEBRIDEMENT LEFT TOE;  Surgeon: Park Liter,  DPM;  Location: [facility] OR;  Service: Podiatry;  Laterality: Left;   I & D EXTREMITY Left 09/14/2020   Procedure: IRRIGATION AND DEBRIDEMENT WRIST;  Surgeon: Betha Loa, MD;  Location: [facility] OR;  Service: Orthopedics;  Laterality: Left;   INSERTION OF DIALYSIS CATHETER     Right subclavian   IR AV DIALY SHUNT INTRO NEEDLE/INTRACATH INITIAL W/PTA/IMG RIGHT Right 02/05/2018   IR FLUORO GUIDE CV LINE LEFT  04/14/2023   IR FLUORO GUIDE CV LINE LEFT  07/16/2023   IR FLUORO GUIDE CV LINE RIGHT  01/31/2020   IR FLUORO  GUIDE CV LINE RIGHT  01/30/2022   IR PTA VENOUS EXCEPT DIALYSIS CIRCUIT  01/30/2022   IR REMOVAL TUN CV CATH W/O FL  04/12/2023   IR THROMBECTOMY AV FISTULA W/THROMBOLYSIS/PTA INC/SHUNT/IMG LEFT Left 08/24/2018   IR THROMBECTOMY AV FISTULA W/THROMBOLYSIS/PTA INC/SHUNT/IMG LEFT Left 01/06/2019   IR US GUIDE VASC ACCESS LEFT  08/24/2018   IR US GUIDE VASC ACCESS LEFT  04/14/2023   IR US GUIDE VASC ACCESS LEFT  07/16/2023   IR US GUIDE VASC ACCESS LEFT  07/16/2023   IR US GUIDE VASC ACCESS RIGHT  02/05/2018   IR US GUIDE VASC ACCESS RIGHT  01/31/2020   IR US GUIDE VASC ACCESS RIGHT  07/16/2023   IR VENO/JUGULAR LEFT  07/16/2023   IR VENO/JUGULAR RIGHT  07/16/2023   IR VENOCAVAGRAM SVC  01/30/2022   IRRIGATION AND DEBRIDEMENT FOOT Right 10/23/2018   Procedure: Irrigation and Debridement to tendon, Left Foot;  Surgeon: Park Liter, DPM;  [facility] OR;  Service: Podiatry;  Laterality: Right;   IRRIGATION AND DEBRIDEMENT FOOT Left 11/01/2018   Procedure: IRRIGATION AND DEBRIDEMENT PARTIAL WOUND CLOSURE LOCAL TISSUE TRANSFER AND FLAP ROTATION, LEFT FOOT;  Surgeon: Park Liter, DPM;  Location: [facility] OR;  Service: Podiatry;  Laterality: Left;   TRANSESOPHAGEAL ECHOCARDIOGRAM (CATH LAB) N/A 07/14/2023   Procedure: TRANSESOPHAGEAL ECHOCARDIOGRAM;  Surgeon: Thomasene Ripple, DO;  [facility] INVASIVE CV LAB;  Service: Cardiovascular;  Laterality: N/A;   TRANSMETATARSAL AMPUTATION N/A 08/18/2018   Procedure: IRRIGATION AND DEBRIDEMENT OF LEFT 5TH TOE AND TRANSMETATARSAL, WITH PARTICAL LEFT 5TH TOE AND METATARSAL AMPUTATION, BONE BIOPSY, WOUND VAC APPLICATION.;  Surgeon: Park Liter, DPM;  Location: [facility] OR;  Service: Podiatry;  Laterality: N/A;   UPPER EXTREMITY VENOGRAPHY N/A 11/16/2016   Procedure: Upper Extremity Venography - Right Central;  Surgeon: Sherren Kerns, MD;  Location: [facility] INVASIVE CV LAB;  Service: Cardiovascular;  Laterality: N/A;   UPPER EXTREMITY VENOGRAPHY N/A 05/25/2018   Procedure: UPPER  EXTREMITY VENOGRAPHY - Bilateral;  Surgeon: Cephus Shelling, MD;  [facility]: [facility] INVASIVE CV LAB;  Service: Cardiovascular;  Laterality: N/A;   HPI:  Pt is a 42 yo male presenting 10/11 with AMS. His dialysis catheter was partially removed and he was in DKA. Admitted for septic shock from MRSA bacteremia; infective endocarditis. LUE weakness was noted 10/17 and MRI confirmed sizable infarct in the R MCA branch as well as small acute infarcts in the L occipital lobe and bilateral cerebellum. Pt initially evaluated by SLP 10/18 and recommended to have Dys 3 diet and thin liquids due primarily oral symptoms. Pt had worsening hypoxia and AMS after vomiting on 10/21 requiring intubation 10/21-10/24. PMH includes: ESRD, DM, gastroparesis, L AKA, HTN, recent admission in July for sepsis secondary to R diabetic foot ulcer (left AMA)    Assessment / Plan / Recommendation  Clinical Impression  Pt has had a decline in swallow function since  initial evaluation earlier this admission, largely related to mentation but difficult to determine if he could be having some post-extubation symptoms as well. He does not vocalize or follow commands with me, although his mom says he is doing both intermittently. Pt does not make any attempts to get POs into his oral cavity via spoon or straw, and at times actually turns his head away. Recommend that he remain NPO at this time pending improvements in mentation, at which time SLP will try to more thoroughly reassess swallowing function.  SLP Visit Diagnosis: Dysphagia, unspecified (R13.10)    Aspiration Risk  Moderate aspiration risk;Risk for inadequate nutrition/hydration    Diet Recommendation NPO;Alternative means - temporary    Medication Administration: Via alternative means    Other  Recommendations Oral Care Recommendations: Oral care QID    Recommendations for follow up therapy are one component of a multi-disciplinary discharge planning process, led by the  attending physician.  Recommendations may be updated based on patient status, additional functional criteria and insurance authorization.  Follow up Recommendations Skilled nursing-short term rehab (<3 hours/day)      Assistance Recommended at Discharge    Functional Status Assessment Patient has had a recent decline in their functional status and demonstrates the ability to make significant improvements in function in a reasonable and predictable amount of time.  Frequency and Duration min 2x/week  2 weeks       Prognosis Prognosis for improved oropharyngeal function: Good Barriers to Reach Goals: Cognitive deficits      Swallow Study   General HPI: Pt is a 42 yo male presenting 10/11 with AMS. His dialysis catheter was partially removed and he was in DKA. Admitted for septic shock from MRSA bacteremia; infective endocarditis. LUE weakness was noted 10/17 and MRI confirmed sizable infarct in the R MCA branch as well as small acute infarcts in the L occipital lobe and bilateral cerebellum. Pt initially evaluated by SLP 10/18 and recommended to have Dys 3 diet and thin liquids due primarily oral symptoms. Pt had worsening hypoxia and AMS after vomiting on 10/21 requiring intubation 10/21-10/24. PMH includes: ESRD, DM, gastroparesis, L AKA, HTN, recent admission in July for sepsis secondary to R diabetic foot ulcer (left AMA) Type of Study: Bedside Swallow Evaluation Previous Swallow Assessment: see HPI Diet Prior to this Study: NPO Temperature Spikes Noted: No Respiratory Status: Nasal cannula History of Recent Intubation: Yes Total duration of intubation (days): 1 days Date extubated: 07/22/23 Behavior/Cognition: Alert;Doesn't follow directions Oral Cavity Assessment: Other (comment) (thick secretions) Oral Care Completed by SLP: Yes Oral Cavity - Dentition: Adequate natural dentition;Poor condition Vision: Functional for self-feeding Self-Feeding Abilities: Total assist Patient  Positioning: Upright in bed Baseline Vocal Quality: Not observed Volitional Cough: Cognitively unable to elicit Volitional Swallow: Unable to elicit    Oral/Motor/Sensory Function Overall Oral Motor/Sensory Function: Other (comment) (not following commands for assessment)   Ice Chips Ice chips: Impaired Presentation: Spoon Oral Phase Impairments: Poor awareness of bolus;Other (comment) (no oral acceptance)   Thin Liquid Thin Liquid: Impaired Presentation: Straw Oral Phase Impairments: Poor awareness of bolus;Other (comment) (no oral acceptance)    Nectar Thick Nectar Thick Liquid: Not tested   Honey Thick Honey Thick Liquid: Not tested   Puree Puree: Not tested   Solid     Solid: Not tested      Mahala Menghini., M.A. CCC-SLP Acute Rehabilitation Services Office [phone]  Secure chat preferred  07/23/2023,10:31 AM

## 2023-07-23 NOTE — Progress Notes (Signed)
Patient Name: WENCESLAO GROMEK Date of Encounter: 07/23/2023 [facility] Cardiologist: None   Interval Summary  .    He has made some progress in the last 24 hours. Successfully extubated and appears to have stable respiratory status. Still has pretty thick secretions, but has ineffective cough. Pressor requirements are reduced, now only on 4 mics of norepinephrine IV. Currently not agitated, still on a low-dose of Precedex. Follows most commands, interacts with show expressions, but only speaks infrequently, unclear whether he has expressive aphasia. Currently on CRRT. Afebrile.  Vital Signs .    Vitals:   07/23/23 0830 07/23/23 0845 07/23/23 0900 07/23/23 0915  BP:      Pulse: 92 96 91 84  Resp: (!) 28 (!) 31 (!) 28 (!) 21  Temp:      TempSrc:      SpO2: 95% 94% 96% 94%  Weight:      Height:        Intake/Output Summary (Last 24 hours) at 07/23/2023 0932 Last data filed at 07/23/2023 0900 Gross per 24 hour  Intake 1511.23 ml  Output 5326.6 ml  Net -3815.37 ml      07/23/2023    5:00 AM 07/22/2023    6:00 AM 07/21/2023    5:30 AM  Last 3 Weights  Weight (lbs) 145 lb 15.1 oz 152 lb 12.5 oz 156 lb 15.5 oz  Weight (kg) 66.2 kg 69.3 kg 71.2 kg      Telemetry/ECG    Sinus rhythm/mild sinus tachycardia- Personally Reviewed  Physical Exam .   GEN: No acute distress.   Neck: No JVD Cardiac: RRR, 1-2/6 apical holosystolic murmur, no systolic murmurs, rubs, or gallops.  Respiratory: Few bilateral rhonchi, otherwise clear to auscultation bilaterally. GI: Soft, nontender, non-distended  MS: No edema  Assessment & Plan .     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  Significant concern remains regarding his neurological status including his ability to speak and swallow safely.  Evaluation in progress.  Seems to be responding to vancomycin therapy with absence of fever, improving WBC, improving sepsis picture.  Significant concern also remains regarding the risk of further embolic events since he has a very large and very mobile mitral vegetation.  Also concerned regarding the ultimate ability to sterilize the bloodstream due to the size of the vegetation.  Has been evaluated by CV surgery and he is felt to be at very high risk for valve surgery, recommendation was made to reach out to a tertiary referral center.  I have called [facility] and am waiting to hear back from their specialists.  Echocardiographic images "pushed" through the portal.  CRITICAL CARE Performed by: Rachelle Hora Lurae Hornbrook   Total critical care time: 45 minutes  Critical care time was exclusive of separately billable procedures and treating other patients.  Critical care was necessary to treat or prevent imminent or life-threatening deterioration.  Critical care was time spent personally by me on the following activities: development of treatment plan with patient and/or surrogate as well as nursing, discussions with consultants, evaluation of patient's response  to treatment, examination of patient, obtaining history from patient or surrogate, ordering and performing treatments and interventions, ordering and review of laboratory studies, ordering and review of radiographic studies, pulse oximetry and re-evaluation of patient's condition.   For questions or updates, please contact [facility] Please consult [email or web address] for  contact info under        Signed, Thurmon Fair, MD

## 2023-07-23 NOTE — Procedures (Signed)
Cortrak  Tube Type:  Cortrak - 43 inches Tube Location:  Left nare Initial Placement:  Stomach Secured by: Bridle Technique Used to Measure Tube Placement:  Marking at nare/corner of mouth Cortrak Secured At:  64 cm   Cortrak Tube Team Note:  Consult received to place a Cortrak feeding tube.   X-ray is required, abdominal x-ray has been ordered by the Cortrak team. Please confirm tube placement before using the Cortrak tube.   If the tube becomes dislodged please keep the tube and contact the Cortrak team at [email or web address] for replacement.  If after hours and replacement cannot be delayed, place a NG tube and confirm placement with an abdominal x-ray.    Mykael Trott MS, RD, LDN Please refer to AMION for RD and/or RD on-call/weekend/after hours pager  

## 2023-07-23 NOTE — Progress Notes (Signed)
[facility] NEPHROLOGY PROGRESS NOTE  Assessment/ Plan:  MRSA bacteremia with MV endocarditis -s/p line holiday, TDC placed by IR 10/18 -on vanc -per primary service and CTS-high risk for surgery, may need transfer to higher level of care to consider surgery but will need to be extubated first which he is  Acute ischemic CVA/septic infarct 10/17 -neuro signed off, per primary service  AHRF -possibly secondary to aspiration. Intubated 10/21, extubated 10/24 -per PCCM -will cont to UF as tolerated with renal replacement therapy  Shock -pressor support per primary serivce  ESRD -typically on HD MWF. -given ongoing shock, started CRRT 10/22. C/w UF goals 100-150cc/hr as tolerated. Heparin through circuit  Dialysis access - Per IR note, occluded both right and left subclavian on venography therefore successful placement of left femoral TDC with the tip of the catheter in the infrarenal IVC.  HTN/volume - UF as tolerated. On pressors as above. 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 Transfuse PRN for hgb <7. On ESA  CKD MBD-  calcitriol per tube. Resume sensipar when taking PO but will see how is calcium is doing first. Replete PO4 PRN, holding binders given hypophos  OP HD: MWF [facility] 4h  400/1.5    77kg   2/3 bath  [facility] TDC    Heparin none - last OP HD 10/7, post wt 74.6kg - coming off 75- 76kg - misses HD once every other week approx - rocaltrol 1.50 mcg  - no esa  Discussed with patient's mother in room.  Shane Sar, MD [facility]  Subjective:  Seen and examined in ICU and on CRRT. Mom at bedside. Extubated yesterday. Tolerating CRRT Pressors: levophed Net neg ~3.8L  Objective Vital signs in last 24 hours: Vitals:   07/23/23 0815 07/23/23 0830 07/23/23 0845 07/23/23 0900  BP:      Pulse: 94 92 96 91  Resp: 20 (!) 28 (!) 31 (!) 28  Temp:       TempSrc:      SpO2: 90% 95% 94% 96%  Weight:      Height:       Weight change: -3.1 kg  Intake/Output Summary (Last 24 hours) at 07/23/2023 0906 Last data filed at 07/23/2023 0900 Gross per 24 hour  Intake 1511.23 ml  Output 5326.6 ml  Net -3815.37 ml       Labs: RENAL PANEL Recent Labs  Lab 07/19/23 0434 07/19/23 0958 07/20/23 0610 07/20/23 1638 07/21/23 0327 07/21/23 0341 07/21/23 2106 07/22/23 0437 07/22/23 0444 07/22/23 1602 07/22/23 2011 07/23/23 0415  NA 134*   < > 131*   < > 131*   < > 133* 132* 135 136 134* 134*  K 4.1   < > 4.4   < > 3.7   < > 3.9 4.1 3.9 4.3 4.2 4.3  CL 92*   < > 91*   < > 98  --  101 101  --  102  --  101  CO2 22   < > 22   < > 23  --  23 23  --  23  --  23  GLUCOSE 106*   < > 167*   < > 246*  --  239* 305*  --  153*  --  156*  BUN 39*   < > 47*   < > 35*  --  26* 23*  --  20  --  17  CREATININE 9.46*   < > 10.96*   < > 6.46*  --  4.32* 3.49*  --  2.76*  --  2.19*  CALCIUM 8.6*   < > 7.7*   < > 7.3*  --  7.7* 7.9*  --  8.5*  --  8.4*  MG 2.7*  --  2.6*  --  2.5*  --   --  2.6*  --   --   --  2.5*  PHOS 4.7*  --  5.5*   < > 3.4  --  2.0* 2.2*  --  2.1*  --  2.5  ALBUMIN  --    < >  --    < > 1.6*  --  1.6* 1.7*  --  1.9*  --  1.8*   < > = values in this interval not displayed.    Liver Function Tests: Recent Labs  Lab 07/19/23 0958 07/20/23 1638 07/22/23 0437 07/22/23 1602 07/23/23 0415  AST 22  --   --   --   --   ALT 7  --   --   --   --   ALKPHOS 169*  --   --   --   --   BILITOT 1.3*  --   --   --   --   PROT 7.4  --   --   --   --   ALBUMIN 2.2*   < > 1.7* 1.9* 1.8*   < > = values in this interval not displayed.   No results for input(s): "LIPASE", "AMYLASE" in the last 168 hours. No results for input(s): "AMMONIA" in the last 168 hours.  CBC: Recent Labs    07/21/23 0329 07/21/23 0341 07/22/23 0438 07/22/23 0444 07/22/23 2011  HGB 8.1* 9.9* 7.1* 8.5* 8.5*  MCV 90.5  --  92.3  --   --     Cardiac  Enzymes: No results for input(s): "CKTOTAL", "CKMB", "CKMBINDEX", "TROPONINI" in the last 168 hours. CBG: Recent Labs  Lab 07/22/23 2216 07/22/23 2329 07/23/23 0149 07/23/23 0420 07/23/23 0823  GLUCAP 100* 102* 109* 149* 166*    Iron Studies: No results for input(s): "IRON", "TIBC", "TRANSFERRIN", "FERRITIN" in the last 72 hours. Studies/Results: CT HEAD WO CONTRAST ( )  Result Date: 07/22/2023 CLINICAL DATA:  Altered mental status and concern for septic emboli. EXAM: CT HEAD WITHOUT CONTRAST TECHNIQUE: Contiguous axial images were obtained from the base of the skull through the vertex without intravenous contrast. RADIATION DOSE REDUCTION: This exam was performed according to the departmental dose-optimization program which includes automated exposure control, adjustment of the mA and/or kV according to patient size and/or use of iterative reconstruction technique. COMPARISON:  07/09/2023 head CT and 07/15/2023 brain MRI FINDINGS: Brain: Expected evolution of cytotoxic edema at the site of right MCA territory infarct. No acute hemorrhage. No midline shift or other mass effect. Vascular: Focal calcification within a M2 branch of the right MCA, likely calcified thromboembolism. Skull: The visualized skull base, calvarium and extracranial soft tissues are normal. Sinuses/Orbits: Opacification of the left maxillary sinus. The orbits are normal. No mastoid or middle ear effusion. IMPRESSION: 1. Expected evolution of right MCA territory infarct. No acute hemorrhage. Electronically Signed   By: Deatra Robinson M.D.   On: 07/22/2023 03:51   DG CHEST PORT 1 VIEW  Result Date: 07/21/2023 CLINICAL DATA:  42 year old male respiratory distress.  Intubated. EXAM: PORTABLE CHEST 1 VIEW COMPARISON:  Portable chest 07/19/2023 and earlier. FINDINGS: Portable AP supine view  at talar 9 hours. Enteric tube tip in good position between the clavicles and carina. Enteric tube side hole at the level of the gastric  body. Left axillary vascular stent again noted. There is a thick-walled cavitary lesion projecting in the lateral left upper lobe at the level of the hilum, progressed since 07/10/2023. This is 3-4 cm. No superimposed pneumothorax, pulmonary edema, pleural effusion or other confluent opacity. IMPRESSION: 1. Satisfactory ET tube and enteric tube. 2. A 3-4 cm thick walled round and cavitary left mid lung lesion has progressed since 07/10/2023. Consider pulmonary abscess, necrotizing pneumonia. No other confluent lung opacity. Electronically Signed   By: Odessa Fleming M.D.   On: 07/21/2023 12:28    Medications: Infusions:  dexmedetomidine (PRECEDEX) IV infusion 1.2 mcg/kg/hr (07/23/23 0900)   heparin 10,000 units/ 20 mL infusion syringe 1,500 Units/hr (07/23/23 0900)   norepinephrine (LEVOPHED) Adult infusion 5 mcg/min (07/23/23 0900)   prismasol BGK 2/2.5 replacement solution 400 mL/hr at 07/23/23 0605   prismasol BGK 2/2.5 replacement solution 500 mL/hr at 07/23/23 0306   prismasol BGK 4/2.5 1,500 mL/hr at 07/23/23 4403   vancomycin 150 mL/hr at 07/23/23 0900    Scheduled Medications:  albuterol  2.5 mg Nebulization TID   calcitRIOL  1.5 mcg Per Tube Q M,W,F   Chlorhexidine Gluconate Cloth  6 each Topical Q0600   darbepoetin (ARANESP) injection - DIALYSIS  40 mcg Subcutaneous Q Fri-1800   famotidine  10 mg Per Tube Daily   feeding supplement (PROSource TF20)  60 mL Per Tube TID   fiber supplement (BANATROL TF)  60 mL Per Tube BID   gabapentin  100 mg Per Tube Q8H   heparin sodium (porcine)  4,600 Units Intracatheter Once   insulin aspart  1-3 Units Subcutaneous Q4H   multivitamin  1 tablet Per Tube BID   mouth rinse  15 mL Mouth Rinse Q2H   thiamine  100 mg Per Tube Daily    have reviewed scheduled and prn medications.  Physical Exam: General: ill appearing Heart:RRR, +systolic murmur Lungs: b/l chest expansion, cta bl Abdomen:soft, Non-tender, non-distended Extremities:No edema, left  aka Neuro: awake, following some commands Dialysis Access: left femoral TDC  Alezandra Egli 07/23/2023,9:06 AM  LOS: 13 days

## 2023-07-23 NOTE — Progress Notes (Signed)
Palliative:  PMT continues to follow along. Per chart review, awaiting decision from [facility] about transfer. Discussed with Anders Simmonds, NP - will hold off on now with further GOC discussions until we hear decision from [facility].   Will continue to follow.  Gerlean Ren, DNP, AGNP-C Palliative Medicine Team Team Phone # [phone]  Pager # [phone]   NO CHARGE

## 2023-07-23 NOTE — Progress Notes (Signed)
PT Cancellation Note  Patient Details Name: [name] MRN: [id number] DOB: 07/03/1981   Cancelled Treatment:    Reason Eval/Treat Not Completed: Medical issues which prohibited therapy  "hold" per RN, Pt is not stable this afternoon.  Furthermore, pt is agitated/uncooperative. 07/23/2023  Jacinto Halim., PT Acute Rehabilitation Services [phone]  (office)   Eliseo Gum Donalda Job 07/23/2023, 5:29 PM

## 2023-07-23 NOTE — Progress Notes (Addendum)
NAME:  [name], MRN:  [id number], DOB:  03/31/1981, LOS: 13 ADMISSION DATE:  07/09/2023, CONSULTATION DATE:  07/10/2023 REFERRING MD:  Suezanne Jacquet, CHIEF COMPLAINT:  DKA, AMS   History of Present Illness:  Patient is a 42 yo M w/ pertinent PMH ESRD on HD MWF, IDDM, gastroparesis, left AKA, HTN presents to [facility] on 10/11 w/ AMS.   Patient recently admitted to [facility] on 7/12 sepsis from right diabetic foot ulcer. Cultures w/ MRSA. Patient had dialysis catheter removed and replaced. ID treated w/ vanc. Patient refused echo and left on 7/17 against medical advise.    On 10/11 patient was found by family member on ground altered. EMS arrived and dialysis catheter partially removed. Patient more alert for them. Has been complaining of diarrhea x1 week per family. Unsure if patient went to dialysis today. Transferred to [facility] ED. On arrival bp stable 107/70 and afebrile. Patient remains confused and occasionally with agitation. Given dilaudid. CBG 497. Beta H >8. VBG ph 7.06, 16, 54, 4.7. Given iv fluids and started on insulin drip for DKA. CT head negative for acute abnormality. Cxr no significant findings. CT abd/pelvis no acute findings; possible steatosis vs. Liver mass 7 x 4 cm recommending f/u w/ MRI. WBC 20.7 and LA 2.4. Sepsis protocol initiated. Cultures obtained and started on vanc/zosyn. Given ams, dka, and soft bp pccm consulted for icu admission.   10/21 pccm called overnight to assess for worsening hypoxia and AMS after vomiting. On NRB w/ sats 70-80s. Hypoglycemic given dextrose which improved his mental status some. Transferred to 2H icu for intubation.     Pertinent  Medical History  ESRD on HD, HTN, diabetes, diabetic gastroparesis, cellulitis of left foot, left AKA  Significant Hospital Events: Including procedures, antibiotic start and stop dates in addition to other pertinent events   10/12: altered mental status, DKA 10/12: blood cultures positive for MRSA 10/21: resp failure  requiring intubation 10/22- TEE 10/22 with EF 35%, LV global hypokinesis. Very large vegetation lateral scallop of posterior mitral leaflet 3.9cm length 1 cm width, highly mobile, prolapsing into LV  10/23: per nursing less responsive than yesterday, increased ETT secretions. Clotting off CRRT. Ct head showing expected evolution of the right MCA stroke.  10/24 agitated when sedation decreased, is purposeful but does not follow commands. Tolerating UF gaols of -150. Still on NE infusion  10/25 failed swallow eval still on just 3 lpm O2. Encephalopathic. Added seroquel in effort to get him off the precedex. Cardiology speaking to [facility] today   Interim History / Subjective:  Still on dex. Still on norepi  Tmax 99.7 Objective   Blood pressure (!) 86/73, pulse 82, temperature 98.2 F (36.8 C), temperature source Axillary, resp. rate (!) 21, height 5\' 7"  (1.702 m), weight 66.2 kg, SpO2 97%.    Vent Mode: CPAP;PSV FiO2 (%):  [32 %-40 %] 32 % Set Rate:  [18 bmp] 18 bmp Vt Set:  [520 mL] 520 mL PEEP:  [5 cmH20-8 cmH20] 5 cmH20 Pressure Support:  [10 cmH20] 10 cmH20 Plateau Pressure:  [23 cmH20] 23 cmH20   Intake/Output Summary (Last 24 hours) at 07/23/2023 0825 Last data filed at 07/23/2023 0700 Gross per 24 hour  Intake 1573.41 ml  Output 5216.6 ml  Net -3643.19 ml   Filed Weights   07/21/23 0530 07/22/23 0600 07/23/23 0500  Weight: 71.2 kg 69.3 kg 66.2 kg    Examination:  General lying in bed, anxious, no distress however.   HEENT normocephalic atraumatic mucous membranes  are moist  Neuro awake, left-sided hemiparesis.  Able to follow commands.  Will mumble inaudible words from time to time.  Seems to have some degree of expressive and receptive aphasia  Pulmonary clear to auscultation, currently on nasal cannula 3 L/min Cardiac regular rhythm Abdomen soft not Extremities normal dry # prior left AKA Derm old healed lesions on the right lower extremity   Resolved Hospital Problem  list   DKA  Septic shock  Assessment & Plan:  Acute hypoxic respiratory failure; due to aspiration PNA, evolving cavitary PNA and mucous plugging  -cxr has been stable.  -LUL cavitary lesion unchanged.  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 Try to wean off after seroquel started Add PRN haldol for now w/ plan to start seroquel VT  Will need extensive PT/OT    Dysphagia Plan NPO Slp following Cortrak for now   Combined systolic/diastolic CHF Plan GDMT when appropriate Volume removal via CRRT   T2DM w/ hyperglycemia  Plan Changed SSI to resistant scale Goal 140-180 May need to add TF coverage when we start nutrition    End-stage renal disease on hemodialysis Plan CRRT per nephro  Serial chems   Anemia/thrombocytopenia Hgb had been down. Do not see one from this am Plan Cbc now Trigger for transfusion < 7  Incidental finding of Liver steatosis vs. Liver mass -CT abd/pelvis showing 7 x 4 cm density in anterior liver  Plan: F/u out pt MRI   Best Practice (right click and "Reselect all SmartList Selections" daily)   Diet/type: tubefeeds DVT prophylaxis: SCD GI  prophylaxis: N/A Lines: Central line, Dialysis Catheter, Arterial Line, and yes and it is still needed Foley:  Yes, and it is still needed Code Status:  full code Last date of multidisciplinary goals of care discussion [10/23: updated mom at bedside on plan of care]   Critical care time: 32 min     Simonne Martinet ACNP-BC [facility] Pulmonary/Critical Care Pager # [phone] OR # [phone] if no answer

## 2023-07-23 NOTE — Plan of Care (Signed)
Patient needing increasing support, MD made aware, new orders received, patient progressing

## 2023-07-23 NOTE — Progress Notes (Signed)
[facility]  Date of Admission:  07/09/2023     Total days of antibiotics 15         ASSESSMENT:  Mr. Arnwine is now extubated and remains encephalopathic with improving leukocytosis in the setting of disseminated MRSA bacteremia with tricuspid and mitral valve endocarditis and right MCA CVA. Cardiology in contact with [facility] for possible transfer for progression of mitral valve endocarditis and now perforation of posterior leaflet and remains at high risk for further embolic events. Random vancomycin levels appear at therapeutic levels and is being tolerated with no adverse side effect. Continue current dose of vancomycin. Burden of infection remains significantly elevated and hopeful he will be a candidate for transfer and surgical intervention. Remaining medical and supportive care per CCM.   PLAN:  Continue current dose of vancomycin. Continue therapeutic monitoring of vancomycin levels per protocol.  Remaining medical and supportive care per CCM.    Dr. Luciana Axe is available over the weekend for ID related questions.   I have personally spent 25 minutes involved in face-to-face and non-face-to-face activities for this patient on the day of the visit. Professional time spent includes the following activities: Preparing to see the patient (review of tests), Obtaining and/or reviewing separately obtained history (admission/discharge record), Performing a medically appropriate examination and/or evaluation , Ordering medications/tests/procedures, referring and communicating with other health care professionals, Documenting clinical information in the EMR, Independently interpreting results (not separately reported), Communicating results to the patient/family/caregiver, Counseling and educating the patient/family/caregiver and Care coordination (not separately reported).    Principal Problem:   Endocarditis of mitral valve Active Problems:   DKA (diabetic  ketoacidosis) ([name])   Altered mental status   MRSA bacteremia   Hemodialysis catheter infection ([name])   Right elbow pain   ESRD on hemodialysis ([name])   Acute systolic CHF (congestive heart failure) ([name])   DCM (dilated cardiomyopathy) ([name])   Coronary artery calcification seen on CAT scan   Infective endocarditis of tricuspid valve   Acute respiratory failure with hypoxia ([name])   Protein-calorie malnutrition, severe    albuterol  2.5 mg Nebulization TID   calcitRIOL  1.5 mcg Per Tube Q M,W,F   Chlorhexidine Gluconate Cloth  6 each Topical Q0600   darbepoetin (ARANESP) injection - DIALYSIS  40 mcg Subcutaneous Q Fri-1800   feeding supplement (PROSource TF20)  60 mL Per Tube TID   fiber supplement (BANATROL TF)  60 mL Per Tube BID   gabapentin  100 mg Per Tube Q8H   insulin aspart  1-3 Units Subcutaneous Q4H   multivitamin  1 tablet Per Tube BID   mouth rinse  15 mL Mouth Rinse Q2H   QUEtiapine  25 mg Per Tube BID   thiamine  100 mg Per Tube Daily    SUBJECTIVE:  Afebrile overnight with no acute events. Now extubated and remains encepahlopathic.   No Known Allergies   Review of Systems: Review of Systems  Unable to perform ROS: Critical illness      OBJECTIVE: Vitals:   07/23/23 1130 07/23/23 1145 07/23/23 1200 07/23/23 1215  BP:      Pulse: 95 95 94 97  Resp: (!) 23 17 (!) 31 (!) 28  Temp:      TempSrc:      SpO2: 94% 95% 95% 94%  Weight:      Height:       Body mass index is 22.86 kg/m.  Physical Exam Constitutional:      General: He  is not in acute distress.    Appearance: He is well-developed. He is ill-appearing.  Cardiovascular:     Rate and Rhythm: Normal rate and regular rhythm.     Heart sounds: Normal heart sounds.  Pulmonary:     Effort: Pulmonary effort is normal.     Breath sounds: Normal breath sounds.  Skin:    General: Skin is warm and dry.     Lab Results Lab Results  Component Value Date   WBC 22.4 (H) 07/22/2023   HGB 8.5  (L) 07/22/2023   HCT 25.0 (L) 07/22/2023   MCV 92.3 07/22/2023   PLT 116 (L) 07/22/2023    Lab Results  Component Value Date   CREATININE 2.19 (H) 07/23/2023   BUN 17 07/23/2023   NA 134 (L) 07/23/2023   K 4.3 07/23/2023   CL 101 07/23/2023   CO2 23 07/23/2023    Lab Results  Component Value Date   ALT 7 07/19/2023   AST 22 07/19/2023   ALKPHOS 169 (H) 07/19/2023   BILITOT 1.3 (H) 07/19/2023     Microbiology: Recent Results (from the past 240 hour(s))  Culture, blood (Routine X 2) w Reflex to ID Panel     Status: None   Collection Time: 07/15/23  3:00 PM   Specimen: BLOOD RIGHT ARM  Result Value Ref Range Status   Specimen Description BLOOD RIGHT ARM  Final   Special Requests   Final    BOTTLES DRAWN AEROBIC AND ANAEROBIC Blood Culture adequate volume   Culture   Final    NO GROWTH 5 DAYS Performed at [facility], [address] [place], [name] [address]    Report Status 07/20/2023 FINAL  Final  Culture, blood (Routine X 2) w Reflex to ID Panel     Status: None   Collection Time: 07/15/23  3:02 PM   Specimen: BLOOD RIGHT ARM  Result Value Ref Range Status   Specimen Description BLOOD RIGHT ARM  Final   Special Requests   Final    BOTTLES DRAWN AEROBIC AND ANAEROBIC Blood Culture adequate volume   Culture   Final    NO GROWTH 5 DAYS Performed at [facility], [address] [place], [name] [address]    Report Status 07/20/2023 FINAL  Final  Culture, Respiratory w Gram Stain     Status: None   Collection Time: 07/19/23 12:55 AM   Specimen: Tracheal Aspirate; Respiratory  Result Value Ref Range Status   Specimen Description TRACHEAL ASPIRATE  Final   Special Requests NONE  Final   Gram Stain   Final    FEW WBC PRESENT, PREDOMINANTLY PMN FEW GRAM POSITIVE COCCI FEW GRAM POSITIVE RODS    Culture   Final    FEW Consistent with normal respiratory flora. No Pseudomonas species isolated Performed at [facility], [address]  [place], [name] [address]    Report Status 07/21/2023 FINAL  Final     Marcos Eke, NP [facility]  07/23/2023  12:31 PM

## 2023-07-23 NOTE — Progress Notes (Signed)
Nutrition Follow-up  DOCUMENTATION CODES:   Severe malnutrition in context of chronic illness  INTERVENTION:   Initiate tube feeding via Cortrak: Osmolite 1.5 at 55 ml/h (1320 ml per day) Prosource TF20 60 ml QID  Provides 2300 kcal, 163 gm protein, 1006 ml free water daily.  Continue Banatrol.  Continue Renal MVI.  NUTRITION DIAGNOSIS:   Severe Malnutrition related to chronic illness as evidenced by severe fat depletion, severe muscle depletion.  Ongoing   GOAL:   Patient will meet greater than or equal to 90% of their needs  Progressing   MONITOR:   TF tolerance, I & O's, Vent status  REASON FOR ASSESSMENT:   Ventilator    ASSESSMENT:   42 yo male admitted with DKA, AMS. PMH includes ESRD on HD, DM on insulin, diabetic gastroparesis, L AKA, HTN, R metatarsal amputation.  Patient was extubated 10/24. Remains on CRRT for volume removal. May transfer to tertiary care center for MV repair surgery. Remains NPO per SLP recommendation. Cortrak tube placed today, tip is near the pylorus. Okay to begin TF per discussion with attending MD.   Labs reviewed. Na 134, K 4.3 WNL, Phos 2.5 WNL CBG: 166-155  Medications reviewed and include calcitriol, aranesp, banatrol, novolog, rena-vit, thiamine, precedex, levophed.   Intake/Output Summary (Last 24 hours) at 07/23/2023 1529 Last data filed at 07/23/2023 1500 Gross per 24 hour  Intake 1642.46 ml  Output 4510.3 ml  Net -2867.84 ml   Anuric 270 ml stool via FMS x 24 hours 5,233 ml removed with CRRT x 24 hours Weight continues to trend downward with negative fluid balance.   Diet Order:   Diet Order             Diet NPO time specified  Diet effective now                   EDUCATION NEEDS:   Not appropriate for education at this time  Skin:  Skin Assessment: Skin Integrity Issues: Skin Integrity Issues:: Other (Comment) Other: skin tear and MASD to rectum, buttocks  Last BM:  10/25 type 7 via  FMS  Height:   Ht Readings from Last 1 Encounters:  07/19/23 5\' 7"  (1.702 m)    Weight:   Wt Readings from Last 1 Encounters:  07/23/23 66.2 kg    Ideal Body Weight:  61.9 kg (adjusted for L AKA)  BMI:  Body mass index is 22.86 kg/m.  Estimated Nutritional Needs:   Kcal:  2000-2300  Protein:  145-175 g  Fluid:  1L plus UOP   Gabriel Rainwater RD, LDN, CNSC Please refer to Amion for contact information.

## 2023-07-23 NOTE — Progress Notes (Signed)
Afternoon rounds  Events Developed ST earlier this am w/ rising norepi needs.  -attempted the following:  Stopped pulling fluid w/ CRRT  Administered a liter or NaCl  Gave Apap w/ low grade temp in 90s  Review of EKG shows ST  Exam General laying I'm bed. Not particularly agitated. Does not appear to be in distress. Occasionally will kick a leg over side of bed HENT NCAT now has PANDA Pulm resting, not labored. Dec bases. Still on [place] Card tachy RRR Abd soft Ext warm  Neuro awake. Still not really verbalizing. Appears to have element of aphasia.   Impression  New sinus tachycardia. Etiology not clear. He's gotten a a liter of crystalloid back and we have stopped pulling fluid so not convinced this is volume. He seems comfortable and not really agitated so hard to believe that is culprit. Has low grade fever, could be contributing.  Just gave apap  Plan Will cont to monitor Keep even volume status Checking stat CBC, BNP, trop I  He had been on [place] heparin so seems unlikely to be PE but guess a possibility. May consider CT chest pending , cbc, bnp and trop I especially if elevated BNP, or trop I.  Simonne Martinet ACNP-BC [facility] Pulmonary/Critical Care Pager # [phone] OR # [phone] if no answer

## 2023-07-23 NOTE — Progress Notes (Signed)
Pharmacy Antibiotic Note  [name] is a 42 y.o. male admitted on 07/09/2023 with sepsis.  Pharmacy has been consulted for cefepime dosing.  Plan: Cefepime 2 grams iv q12h  Height: 5\' 7"  (170.2 cm) Weight: 66.2 kg (145 lb 15.1 oz) IBW/kg (Calculated) : 66.1  Temp (24hrs), Avg:98.8 F (37.1 C), Min:98.2 F (36.8 C), Max:99.7 F (37.6 C)  Recent Labs  Lab 07/19/23 0434 07/19/23 0958 07/20/23 0349 07/20/23 0610 07/20/23 1136 07/20/23 1638 07/21/23 0329 07/21/23 0347 07/21/23 2106 07/22/23 0437 07/22/23 0438 07/22/23 1602 07/23/23 0415 07/23/23 1630 07/23/23 1803  WBC ACCURACY OF RESULTS QUESTIONABLE. RECOMMEND RECOLLECT TO VERIFY. 73.5* 44.8*  --   --   --  28.8*  --   --   --  22.4*  --   --  25.9*  --   CREATININE 9.46* 10.00*  --    < >  --    < >  --   --  4.32* 3.49*  --  2.76* 2.19* 2.09*  --   LATICACIDVEN 2.5* 2.7*  --   --  2.7*  --   --  0.8  --   --   --   --   --   --  2.0*  VANCORANDOM  --   --   --   --   --   --   --   --   --  16  --   --   --   --   --    < > = values in this interval not displayed.    Estimated Creatinine Clearance: 42.6 mL/min (A) (by C-G formula based on SCr of 2.09 mg/dL (H)).    No Known Allergies   Thank you for allowing pharmacy to be a part of this patient's care.  Greta Doom BS, PharmD, BCPS Clinical Pharmacist 07/23/2023 6:57 PM  Contact: [phone] after 3 PM  "Be curious, not judgmental..." -Debbora Dus

## 2023-07-23 NOTE — Progress Notes (Addendum)
Follow up   Review of labs Hgb down to 6. From 7.1 Wbc still elevated. Trending up slightly  BNP 126 Trop I now 124 Anion gap 13 w/ delta ratio of 0.16 so even w/ slightly elevated lactate of 2.0 looks like non-gap component of his metabolic acidosis as well  He remains tachycardic HR 129 (sinus)  Currently on 30 mcg/min norepi Not febrile currently Last APTT supratherapeutic still so really doubt PE. More likely the tachycardia and increased pressor needs are related to his amenia +/- consider sepsis/ polymicrobial infection in addition to the MRSA. We did just dc GNR coverage yesterday   Plan Will repeat blood cultures add back GNR coverage Transfuse 1 unit and f/u cbc post transfusion  Trend lactate.   Additional ccm time 32 min   Simonne Martinet ACNP-BC [facility] Pulmonary/Critical Care Pager # [phone] OR # [phone] if no answer

## 2023-07-23 NOTE — Progress Notes (Signed)
I reached out to the [facility] transfer center [phone]) to see if they would consider receiving [name] as a patient in anticipation of high risk mitral valve surgery.  I was told that there are currently no ICU level beds available.  They asked [place] to call back tomorrow to reassess the bed status or call back later today if he is downgraded to a lower level of care (which is very unlikely to happen today).

## 2023-07-23 NOTE — Progress Notes (Signed)
eLink Physician-Brief Progress Note Patient Name: [name] DOB: 1981/08/08 MRN: [id number]   Date of Service  07/23/2023  HPI/Events of Note  Patient with increased lactic acid to 2.5, and rising pressor requirement, last BNP was 129, and serum albumin is 1.8.  eICU Interventions  Will order Albumin 5 % 500 ml over 2 hours and trend lactic acid level.        [name] [name] 07/23/2023, 10:09 PM

## 2023-07-24 DIAGNOSIS — I059 Rheumatic mitral valve disease, unspecified: Secondary | ICD-10-CM | POA: Diagnosis not present

## 2023-07-24 LAB — LACTIC ACID, PLASMA: Lactic Acid, Venous: 0.8 mmol/L (ref 0.5–1.9)

## 2023-07-24 LAB — HEPATIC FUNCTION PANEL
ALT: 7 U/L (ref 0–44)
ALT: 6 U/L (ref 0–44)
AST: 19 U/L (ref 15–41)
AST: 18 U/L (ref 15–41)
Albumin: 2.2 g/dL — ABNORMAL LOW (ref 3.5–5.0)
Albumin: 2.1 g/dL — ABNORMAL LOW (ref 3.5–5.0)
Alkaline Phosphatase: 145 U/L — ABNORMAL HIGH (ref 38–126)
Alkaline Phosphatase: 135 U/L — ABNORMAL HIGH (ref 38–126)
Bilirubin, Direct: 0.5 mg/dL — ABNORMAL HIGH (ref 0.0–0.2)
Bilirubin, Direct: 0.6 mg/dL — ABNORMAL HIGH (ref 0.0–0.2)
Indirect Bilirubin: 2.3 mg/dL — ABNORMAL HIGH (ref 0.3–0.9)
Indirect Bilirubin: 2.3 mg/dL — ABNORMAL HIGH (ref 0.3–0.9)
Total Bilirubin: 2.8 mg/dL — ABNORMAL HIGH (ref 0.3–1.2)
Total Bilirubin: 2.9 mg/dL — ABNORMAL HIGH (ref 0.3–1.2)
Total Protein: 7.5 g/dL (ref 6.5–8.1)
Total Protein: 7 g/dL (ref 6.5–8.1)

## 2023-07-24 LAB — RENAL FUNCTION PANEL
Albumin: 2.2 g/dL — ABNORMAL LOW (ref 3.5–5.0)
Albumin: 2.1 g/dL — ABNORMAL LOW (ref 3.5–5.0)
Anion gap: 10 (ref 5–15)
Anion gap: 11 (ref 5–15)
BUN: 18 mg/dL (ref 6–20)
BUN: 23 mg/dL — ABNORMAL HIGH (ref 6–20)
CO2: 25 mmol/L (ref 22–32)
CO2: 24 mmol/L (ref 22–32)
Calcium: 8.6 mg/dL — ABNORMAL LOW (ref 8.9–10.3)
Calcium: 8.6 mg/dL — ABNORMAL LOW (ref 8.9–10.3)
Chloride: 100 mmol/L (ref 98–111)
Chloride: 100 mmol/L (ref 98–111)
Creatinine, Ser: 1.58 mg/dL — ABNORMAL HIGH (ref 0.61–1.24)
Creatinine, Ser: 1.89 mg/dL — ABNORMAL HIGH (ref 0.61–1.24)
GFR, Estimated: 55 mL/min — ABNORMAL LOW (ref >60)
GFR, Estimated: 45 mL/min — ABNORMAL LOW (ref >60)
Glucose, Bld: 133 mg/dL — ABNORMAL HIGH (ref 70–99)
Glucose, Bld: 322 mg/dL — ABNORMAL HIGH (ref 70–99)
Phosphorus: 2 mg/dL — ABNORMAL LOW (ref 2.5–4.6)
Phosphorus: 1.9 mg/dL — ABNORMAL LOW (ref 2.5–4.6)
Potassium: 4 mmol/L (ref 3.5–5.1)
Potassium: 3.9 mmol/L (ref 3.5–5.1)
Sodium: 135 mmol/L (ref 135–145)
Sodium: 135 mmol/L (ref 135–145)

## 2023-07-24 LAB — AMMONIA: Ammonia: 23 umol/L (ref 9–35)

## 2023-07-24 LAB — GLUCOSE, CAPILLARY
Glucose-Capillary: 125 mg/dL — ABNORMAL HIGH (ref 70–99)
Glucose-Capillary: 139 mg/dL — ABNORMAL HIGH (ref 70–99)
Glucose-Capillary: 189 mg/dL — ABNORMAL HIGH (ref 70–99)
Glucose-Capillary: 240 mg/dL — ABNORMAL HIGH (ref 70–99)
Glucose-Capillary: 29 mg/dL — CL (ref 70–99)
Glucose-Capillary: 298 mg/dL — ABNORMAL HIGH (ref 70–99)
Glucose-Capillary: 318 mg/dL — ABNORMAL HIGH (ref 70–99)
Glucose-Capillary: 415 mg/dL — ABNORMAL HIGH (ref 70–99)
Glucose-Capillary: 52 mg/dL — ABNORMAL LOW (ref 70–99)

## 2023-07-24 LAB — CBC
HCT: 20.3 % — ABNORMAL LOW (ref 39.0–52.0)
HCT: 22 % — ABNORMAL LOW (ref 39.0–52.0)
Hemoglobin: 6.3 g/dL — CL (ref 13.0–17.0)
Hemoglobin: 6.8 g/dL — CL (ref 13.0–17.0)
MCH: 29.3 pg (ref 26.0–34.0)
MCH: 28.7 pg (ref 26.0–34.0)
MCHC: 31 g/dL (ref 30.0–36.0)
MCHC: 30.9 g/dL (ref 30.0–36.0)
MCV: 94.4 fL (ref 80.0–100.0)
MCV: 92.8 fL (ref 80.0–100.0)
Platelets: 134 10*3/uL — ABNORMAL LOW (ref 150–400)
Platelets: 124 10*3/uL — ABNORMAL LOW (ref 150–400)
RBC: 2.15 MIL/uL — ABNORMAL LOW (ref 4.22–5.81)
RBC: 2.37 MIL/uL — ABNORMAL LOW (ref 4.22–5.81)
RDW: 16 % — ABNORMAL HIGH (ref 11.5–15.5)
RDW: 16.5 % — ABNORMAL HIGH (ref 11.5–15.5)
WBC: 24.3 10*3/uL — ABNORMAL HIGH (ref 4.0–10.5)
WBC: 16 10*3/uL — ABNORMAL HIGH (ref 4.0–10.5)
nRBC: 0.1 % (ref 0.0–0.2)
nRBC: 0.1 % (ref 0.0–0.2)

## 2023-07-24 LAB — HEMOGLOBIN AND HEMATOCRIT, BLOOD
HCT: 24.5 % — ABNORMAL LOW (ref 39.0–52.0)
Hemoglobin: 7.7 g/dL — ABNORMAL LOW (ref 13.0–17.0)

## 2023-07-24 LAB — PREPARE RBC (CROSSMATCH)

## 2023-07-24 LAB — CG4 I-STAT (LACTIC ACID): Lactic Acid, Venous: 0.7 mmol/L (ref 0.5–1.9)

## 2023-07-24 LAB — MAGNESIUM: Magnesium: 2.6 mg/dL — ABNORMAL HIGH (ref 1.7–2.4)

## 2023-07-24 LAB — APTT: aPTT: 44 s — ABNORMAL HIGH (ref 24–36)

## 2023-07-24 MED ORDER — PRISMASOL BGK 4/2.5 32-4-2.5 MEQ/L EC SOLN: Status: DC

## 2023-07-24 MED ORDER — CLONAZEPAM 0.5 MG PO TABS
0.5000 mg | ORAL_TABLET | Freq: Two times a day (BID) | ORAL | Status: DC
Start: 2023-07-24 — End: 2023-07-31
  Administered 2023-07-24 – 2023-07-31 (×15): 0.5 mg via ORAL
  Filled 2023-07-24 (×15): qty 1

## 2023-07-24 MED ORDER — INSULIN ASPART 100 UNIT/ML IJ SOLN
20.0000 [IU] | Freq: Once | INTRAMUSCULAR | Status: AC
  Administered 2023-07-24: 20 [IU] via SUBCUTANEOUS

## 2023-07-24 MED ORDER — INSULIN ASPART 100 UNIT/ML IJ SOLN
3.0000 [IU] | INTRAMUSCULAR | Status: DC
  Administered 2023-07-24: 3 [IU] via SUBCUTANEOUS

## 2023-07-24 MED ORDER — ORAL CARE MOUTH RINSE
15.0000 mL | OROMUCOSAL | Status: DC
  Administered 2023-07-25 – 2023-08-20 (×70): 15 mL via OROMUCOSAL

## 2023-07-24 MED ORDER — POTASSIUM & SODIUM PHOSPHATES 280-160-250 MG PO PACK
1.0000 | PACK | Freq: Two times a day (BID) | ORAL | Status: AC
  Administered 2023-07-24 (×2): 1 via ORAL
  Filled 2023-07-24 (×2): qty 1

## 2023-07-24 MED ORDER — SODIUM CHLORIDE 0.9% IV SOLUTION: Freq: Once | INTRAVENOUS | Status: AC

## 2023-07-24 MED ORDER — INSULIN ASPART 100 UNIT/ML IJ SOLN
5.0000 [IU] | INTRAMUSCULAR | Status: DC
  Administered 2023-07-24: 5 [IU] via SUBCUTANEOUS

## 2023-07-24 MED ORDER — INSULIN GLARGINE-YFGN 100 UNIT/ML ~~LOC~~ SOLN
10.0000 [IU] | Freq: Every day | SUBCUTANEOUS | Status: DC
  Administered 2023-07-24 – 2023-07-26 (×3): 10 [IU] via SUBCUTANEOUS
  Filled 2023-07-24 (×3): qty 0.1

## 2023-07-24 MED ORDER — PRISMASOL BGK 0/2.5 32-2.5 MEQ/L EC SOLN: Status: DC

## 2023-07-24 MED ORDER — INSULIN ASPART 100 UNIT/ML IJ SOLN
0.0000 [IU] | INTRAMUSCULAR | Status: DC
  Administered 2023-07-24: 8 [IU] via SUBCUTANEOUS

## 2023-07-24 MED ORDER — INSULIN ASPART 100 UNIT/ML IJ SOLN
0.0000 [IU] | INTRAMUSCULAR | Status: DC
  Administered 2023-07-25: 3 [IU] via SUBCUTANEOUS
  Administered 2023-07-25: 8 [IU] via SUBCUTANEOUS
  Administered 2023-07-25: 15 [IU] via SUBCUTANEOUS
  Administered 2023-07-25: 11 [IU] via SUBCUTANEOUS
  Administered 2023-07-25: 5 [IU] via SUBCUTANEOUS
  Administered 2023-07-26: 8 [IU] via SUBCUTANEOUS
  Administered 2023-07-26: 5 [IU] via SUBCUTANEOUS

## 2023-07-24 MED ORDER — ORAL CARE MOUTH RINSE: 15.0000 mL | OROMUCOSAL | Status: DC | PRN

## 2023-07-24 MED ORDER — INSULIN ASPART 100 UNIT/ML IJ SOLN
0.0000 [IU] | INTRAMUSCULAR | Status: DC
  Administered 2023-07-24: 15 [IU] via SUBCUTANEOUS

## 2023-07-24 NOTE — Progress Notes (Signed)
eLink Physician-Brief Progress Note Patient Name: [name] DOB: September 06, 1981 MRN: [id number]   Date of Service  07/24/2023  HPI/Events of Note  Hemoglobin 6.3 gm / dl, no evidence of active bleeding.  eICU Interventions  One unit PRBC ordered.        [name] [name] 07/24/2023, 4:48 AM

## 2023-07-24 NOTE — Progress Notes (Signed)
NAME:  [name], MRN:  [id number], DOB:  1980-09-29, LOS: 14 ADMISSION DATE:  07/09/2023, CONSULTATION DATE:  07/10/2023 REFERRING MD:  Suezanne Jacquet, CHIEF COMPLAINT:  DKA, AMS   History of Present Illness:  Patient is a 42 yo M w/ pertinent PMH ESRD on HD MWF, IDDM, gastroparesis, left AKA, HTN presents to [facility] on 10/11 w/ AMS.   Patient recently admitted to [facility] on 7/12 sepsis from right diabetic foot ulcer. Cultures w/ MRSA. Patient had dialysis catheter removed and replaced. ID treated w/ vanc. Patient refused echo and left on 7/17 against medical advise.    On 10/11 patient was found by family member on ground altered. EMS arrived and dialysis catheter partially removed. Patient more alert for them. Has been complaining of diarrhea x1 week per family. Unsure if patient went to dialysis today. Transferred to [facility] ED. On arrival bp stable 107/70 and afebrile. Patient remains confused and occasionally with agitation. Given dilaudid. CBG 497. Beta H >8. VBG ph 7.06, 16, 54, 4.7. Given iv fluids and started on insulin drip for DKA. CT head negative for acute abnormality. Cxr no significant findings. CT abd/pelvis no acute findings; possible steatosis vs. Liver mass 7 x 4 cm recommending f/u w/ MRI. WBC 20.7 and LA 2.4. Sepsis protocol initiated. Cultures obtained and started on vanc/zosyn. Given ams, dka, and soft bp pccm consulted for icu admission.   10/21 pccm called overnight to assess for worsening hypoxia and AMS after vomiting. On NRB w/ sats 70-80s. Hypoglycemic given dextrose which improved his mental status some. Transferred to 2H icu for intubation.     Pertinent  Medical History  ESRD on HD, HTN, diabetes, diabetic gastroparesis, cellulitis of left foot, left AKA  Significant Hospital Events: Including procedures, antibiotic start and stop dates in addition to other pertinent events   10/12: altered mental status, DKA 10/12: blood cultures positive for MRSA 10/21: resp failure  requiring intubation 10/22- TEE 10/22 with EF 35%, LV global hypokinesis. Very large vegetation lateral scallop of posterior mitral leaflet 3.9cm length 1 cm width, highly mobile, prolapsing into LV  10/23: per nursing less responsive than yesterday, increased ETT secretions. Clotting off CRRT. Ct head showing expected evolution of the right MCA stroke.  10/24 agitated when sedation decreased, is purposeful but does not follow commands. Tolerating UF gaols of -150. Still on NE infusion  10/25 failed swallow eval still on just 3 lpm O2. Encephalopathic. Added seroquel in effort to get him off the precedex. Cardiology speaking to [facility] today  10/26 hemoglobin again dropped to 6.3 overnight with increased pressor requirement yesterday afternoon now on vaso and levo.  Interim History / Subjective:  Seen lying in bed altered   Objective   Blood pressure (!) 128/55, pulse 93, temperature 98.6 F (37 C), temperature source Oral, resp. rate (!) 21, height 5\' 7"  (1.702 m), weight 66.5 kg, SpO2 100%.    FiO2 (%):  [32 %] 32 %   Intake/Output Summary (Last 24 hours) at 07/24/2023 0814 Last data filed at 07/24/2023 0700 Gross per 24 hour  Intake 3963.12 ml  Output 1612.29 ml  Net 2350.83 ml   Filed Weights   07/22/23 0600 07/23/23 0500 07/24/23 0500  Weight: 69.3 kg 66.2 kg 66.5 kg    Examination: General: Acute on chronically ill appearing deconditioned adult male lying in bed, in NAD HEENT: [place]/AT, MM pink/moist, PERRL,  Neuro: Spontaneously awake unable to follow commands  CV: s1s2 regular rate and rhythm, no murmur, rubs, or gallops,  PULM:  Clear to auscultation, diminished bases, no increased work of breathing  GI: soft, bowel sounds active in all 4 quadrants, non-tender, non-distended, tolerating TF Extremities: warm/dry, no edema  Skin: no rashes or lesions  Resolved Hospital Problem list   DKA  Septic shock  Assessment & Plan:  Acute hypoxic respiratory failure; due to aspiration  PNA, evolving cavitary PNA and mucous plugging  -cxr has been stable.  -LUL cavitary lesion unchanged.  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  Cardiogenic shock  P: Continuous telemetry  Strict intake and output  Daily weight to assess volume status Volume removal per CRRT  Closely monitor renal function and electrolytes  Provide supplemtal oxygen as needed  Continue pressor support for MAP goal > 65 GDMT as able  Pending possible transfer to [facility] for valvular repair    T2DM w/ hyperglycemia  P: Continue SSI  CBG goal 140-180 CBG checks q4  End-stage renal disease on hemodialysis P: Nephrology following  Continue CRRT  Follow renal function  Monitor  urine output Trend Bmet Avoid nephrotoxins Ensure adequate renal perfusion   Anemia/thrombocytopenia P: Trend CBC  Transfuse per protocol  HGB gal >7   Incidental finding of Liver steatosis vs. Liver mass -CT abd/pelvis showing 7 x 4 cm density in anterior liver  P: Follow up outpatient   Best Practice (right click and "Reselect all SmartList Selections"  daily)   Diet/type: tubefeeds DVT prophylaxis: SCD GI prophylaxis: N/A Lines: Central line, Dialysis Catheter, Arterial Line, and yes and it is still needed Foley:  Yes, and it is still needed Code Status:  full code Last date of multidisciplinary goals of care discussion [10/23: updated mom at bedside on plan of care]   Critical care time:    Performed by: Anesia Blackwell D. Harris  Total critical care time: 37 minutes  Critical care time was exclusive of separately billable procedures and treating other patients.  Critical care was necessary to treat or prevent imminent or life-threatening deterioration.  Critical care was time spent personally by me on the following activities: development of treatment plan with patient and/or surrogate as well as nursing, discussions with consultants, evaluation of patient's response to treatment, examination of patient, obtaining history from patient or surrogate, ordering and performing treatments and interventions, ordering and review of laboratory studies, ordering and review of radiographic studies, pulse oximetry and re-evaluation of patient's condition.  Denver Harder D. Harris, NP-C [facility] Personal contact information can be found on Amion  If no contact or response made please call [phone] 07/24/2023, 9:01 AM

## 2023-07-24 NOTE — Progress Notes (Signed)
eLink Physician-Brief Progress Note Patient Name: [name] DOB: 1981/01/17 MRN: [id number]   Date of Service  07/24/2023  HPI/Events of Note  Hypoglycemic tonight.  2000-29,  2130 52.  eICU Interventions  Discontinue scheduled insulin every 4 hrs, decrease SSI to moderate.  No change to long acting insulin     Intervention Category Intermediate Interventions: Other:  Henry Russel, P 07/24/2023, 9:30 PM

## 2023-07-24 NOTE — Progress Notes (Signed)
Patient ID: [name], male   DOB: 1981/02/27, 42 y.o.   MRN: [id number]    Progress Note from the Palliative Medicine Team at [facility]   Patient Name: [name]        Date: 07/24/2023 DOB: Jun 23, 1981  Age: 41 y.o. MRN#: [id number] Attending Physician: Josephine Igo, DO Primary Care Physician: [name], NP Admit Date: 07/09/2023   PMT continues to follow.  Per chart review continues to await decision from [facility] about transfer--will hold off for further goals of care discussion until we hear decision from [facility].  No charge  Lorinda Creed NP  Palliative Medicine Team Team Phone # [phone] Pager [phone]

## 2023-07-24 NOTE — Progress Notes (Addendum)
[facility] NEPHROLOGY PROGRESS NOTE  Assessment/ Plan:  MRSA bacteremia with MV endocarditis -s/p line holiday, TDC placed by IR 10/18 -on vanc and cefepime -per primary service and CTS-high risk for surgery, may need transfer to higher level of care to consider surgery but will need to be extubated first which he is  Acute ischemic CVA/septic infarct 10/17 -neuro signed off, per primary service  AHRF -possibly secondary to aspiration. Intubated 10/21, extubated 10/24 -per PCCM -will cont to UF as tolerated with renal replacement therapy  Shock -pressor support per primary serivce  ESRD -typically on HD MWF. -given ongoing shock, started CRRT 10/22. Net even UFG, can increase to net neg 50cc/hr -pre and post: 2k; D: 4k  Dialysis access - Per IR note, occluded both right and left subclavian on venography therefore successful placement of left femoral TDC with the tip of the catheter in the infrarenal IVC.  HTN/volume - UF as tolerated. On pressors as above. 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 Transfuse PRN for hgb <7. On ESA -questionable hemolysis from endocarditis? Will defer to primary service for workup of anemia  CKD MBD-  calcitriol per tube. Resume sensipar when taking PO but will see how is calcium is doing first. Replete PO4 PRN, holding binders given hypophos  OP HD: MWF [facility] 4h  400/1.5    77kg   2/3 bath  [facility] TDC    Heparin none - last OP HD 10/7, post wt 74.6kg - coming off 75- 76kg - misses HD once every other week approx - rocaltrol 1.50 mcg  - no esa  Discussed with patient's mother, ICU RN, and primary service.  Anthony Sar, MD [facility]  Subjective:  Seen and examined in ICU and on CRRT. Mom at bedside. Denies chest pain and SOB when prompted. Much more awake for me today. Hgb 6.3--currently receiving 1u prbc Had increasing  pressor requirements. Therefore, UF goals decreased to net even and GNR coverage was added.  Net postitive ~2.2L Pressors: levo, vaso  Objective Vital signs in last 24 hours: Vitals:   07/24/23 0730 07/24/23 0745 07/24/23 0800 07/24/23 0815  BP:      Pulse: 97 95  (!) 118  Resp: (!) 22 (!) 21 (!) 21 (!) 25  Temp:   99.2 F (37.3 C)   TempSrc:   Oral   SpO2: 99% 100%  99%  Weight:      Height:       Weight change: 0.3 kg  Intake/Output Summary (Last 24 hours) at 07/24/2023 0932 Last data filed at 07/24/2023 0800 Gross per 24 hour  Intake 3909.08 ml  Output 1587.09 ml  Net 2321.99 ml       Labs: RENAL PANEL Recent Labs  Lab 07/20/23 0610 07/20/23 1638 07/21/23 0327 07/21/23 0341 07/22/23 0437 07/22/23 0444 07/22/23 1602 07/22/23 2011 07/23/23 0415 07/23/23 1630 07/23/23 1756 07/24/23 0407  NA 131*   < > 131*   < > 132*   < > 136 134* 134* 132* 131* 135  K 4.4   < > 3.7   < > 4.1   < > 4.3 4.2 4.3 4.1 4.5 4.0  CL 91*   < > 98   < > 101  --  102  --  101 101  --  100  CO2 22   < > 23   < > 23  --  23  --  23 18*  --  25  GLUCOSE 167*   < > 246*   < > 305*  --  153*  --  156* 165*  --  133*  BUN 47*   < > 35*   < > 23*  --  20  --  17 18  --  18  CREATININE 10.96*   < > 6.46*   < > 3.49*  --  2.76*  --  2.19* 2.09*  --  1.58*  CALCIUM 7.7*   < > 7.3*   < > 7.9*  --  8.5*  --  8.4* 8.2*  --  8.6*  MG 2.6*  --  2.5*  --  2.6*  --   --   --  2.5*  --   --  2.6*  PHOS 5.5*   < > 3.4   < > 2.2*  --  2.1*  --  2.5 2.7  --  2.0*  ALBUMIN  --    < > 1.6*   < > 1.7*  --  1.9*  --  1.8* 1.8*  --  2.2*   < > = values in this interval not displayed.    Liver Function Tests: Recent Labs  Lab 07/19/23 0958 07/20/23 1638 07/23/23 0415 07/23/23 1630 07/24/23 0407  AST 22  --   --   --   --   ALT 7  --   --   --   --   ALKPHOS 169*  --   --   --   --   BILITOT 1.3*  --   --   --   --   PROT 7.4  --   --   --   --   ALBUMIN 2.2*   < > 1.8* 1.8* 2.2*   < > = values  in this interval not displayed.   No results for input(s): "LIPASE", "AMYLASE" in the last 168 hours. No results for input(s): "AMMONIA" in the last 168 hours.  CBC: Recent Labs    07/22/23 2011 07/23/23 1630 07/23/23 1756 07/23/23 2047 07/24/23 0407  HGB 8.5* 6.0* 7.5* 7.4* 6.3*  MCV  --  95.9  --  93.9 94.4    Cardiac Enzymes: No results for input(s): "CKTOTAL", "CKMB", "CKMBINDEX", "TROPONINI" in the last 168 hours. CBG: Recent Labs  Lab 07/23/23 1650 07/23/23 2052 07/23/23 2317 07/24/23 0405 07/24/23 0839  GLUCAP 176* 334* 297* 139* 298*    Iron Studies: No results for input(s): "IRON", "TIBC", "TRANSFERRIN", "FERRITIN" in the last 72 hours. Studies/Results: DG Abd Portable 1V  Result Date: 07/23/2023 CLINICAL DATA:  [id number] Encounter for feeding tube placement [id number] EXAM: PORTABLE ABDOMEN - 1 VIEW COMPARISON:  None Available. FINDINGS: Lung bases are clear. Weighted enteric tube courses below diaphragm with the tip terminating near the pylorus. Paucity of small bowel gas limits the ability to assess for obstruction. No dilated loops of bowel are visualized. No acute osseous abnormality. IMPRESSION: Weighted enteric tube courses below diaphragm with the tip terminating near the pylorus. Electronically Signed   By: Lorenza Cambridge M.D.   On: 07/23/2023 12:31    Medications: Infusions:  ceFEPime (MAXIPIME) IV 2 g (07/24/23 0854)   dexmedetomidine (PRECEDEX) IV infusion 0.5 mcg/kg/hr (07/24/23 0800)   feeding supplement (OSMOLITE 1.5 CAL) 55 mL/hr at 07/24/23 0800   heparin 10,000 units/ 20 mL infusion syringe 550 Units/hr (07/24/23 0400)   norepinephrine (LEVOPHED) 16 mg in dextrose 5 % 250 mL (0.064 mg/mL)  infusion 8 mcg/min (07/24/23 0800)   prismasol BGK 2/2.5 replacement solution 400 mL/hr at 07/24/23 0618   prismasol BGK 2/2.5 replacement solution 500 mL/hr at 07/24/23 0414   prismasol BGK 4/2.5 1,500 mL/hr at 07/24/23 0630   vancomycin Stopped (07/23/23 0936)    vasopressin 0.03 Units/min (07/24/23 0800)    Scheduled Medications:  sodium chloride   Intravenous Once   calcitRIOL  1.5 mcg Per Tube Q M,W,F   Chlorhexidine Gluconate Cloth  6 each Topical Q0600   clonazePAM  0.5 mg Oral BID   darbepoetin (ARANESP) injection - DIALYSIS  40 mcg Subcutaneous Q Fri-1800   feeding supplement (PROSource TF20)  60 mL Per Tube QID   fiber supplement (BANATROL TF)  60 mL Per Tube BID   gabapentin  100 mg Per Tube Q8H   insulin aspart  0-15 Units Subcutaneous Q4H   insulin aspart  3 Units Subcutaneous Q4H   multivitamin  1 tablet Per Tube BID   mouth rinse  15 mL Mouth Rinse Q2H   QUEtiapine  25 mg Per Tube BID   thiamine  100 mg Per Tube Daily    have reviewed scheduled and prn medications.  Physical Exam: General: ill appearing Heart:RRR, +systolic murmur Lungs: b/l chest expansion, cta bl Abdomen:soft, Non-tender, non-distended Extremities:No edema, left aka Neuro: awake, following commands Dialysis Access: left femoral TDC in use  Nada Godley 07/24/2023,9:32 AM  LOS: 14 days

## 2023-07-24 NOTE — Progress Notes (Signed)
Cardiology:  Remains in multisystem organ failure  Left AKA ESRD CVVHD Delirium Mitral valve endocarditis, stroke septic emboli. Yesterday's note reviewed, no available beds at [facility]. Colleagues notes reviewed.  Continue with supportive care.

## 2023-07-25 DIAGNOSIS — I059 Rheumatic mitral valve disease, unspecified: Secondary | ICD-10-CM | POA: Diagnosis not present

## 2023-07-25 DIAGNOSIS — I340 Nonrheumatic mitral (valve) insufficiency: Secondary | ICD-10-CM | POA: Diagnosis not present

## 2023-07-25 DIAGNOSIS — I5021 Acute systolic (congestive) heart failure: Secondary | ICD-10-CM | POA: Diagnosis not present

## 2023-07-25 LAB — CBC
HCT: 24.3 % — ABNORMAL LOW (ref 39.0–52.0)
Hemoglobin: 7.6 g/dL — ABNORMAL LOW (ref 13.0–17.0)
MCH: 29.2 pg (ref 26.0–34.0)
MCHC: 31.3 g/dL (ref 30.0–36.0)
MCV: 93.5 fL (ref 80.0–100.0)
Platelets: 78 10*3/uL — ABNORMAL LOW (ref 150–400)
RBC: 2.6 MIL/uL — ABNORMAL LOW (ref 4.22–5.81)
RDW: 17.6 % — ABNORMAL HIGH (ref 11.5–15.5)
WBC: 13.8 10*3/uL — ABNORMAL HIGH (ref 4.0–10.5)
nRBC: 0.3 % — ABNORMAL HIGH (ref 0.0–0.2)

## 2023-07-25 LAB — POCT I-STAT, CHEM 8
BUN: 29 mg/dL — ABNORMAL HIGH (ref 6–20)
BUN: 24 mg/dL — ABNORMAL HIGH (ref 6–20)
BUN: 27 mg/dL — ABNORMAL HIGH (ref 6–20)
BUN: 25 mg/dL — ABNORMAL HIGH (ref 6–20)
BUN: 25 mg/dL — ABNORMAL HIGH (ref 6–20)
BUN: 22 mg/dL — ABNORMAL HIGH (ref 6–20)
BUN: 25 mg/dL — ABNORMAL HIGH (ref 6–20)
BUN: 23 mg/dL — ABNORMAL HIGH (ref 6–20)
BUN: 26 mg/dL — ABNORMAL HIGH (ref 6–20)
BUN: 24 mg/dL — ABNORMAL HIGH (ref 6–20)
BUN: 25 mg/dL — ABNORMAL HIGH (ref 6–20)
Calcium, Ion: 1.24 mmol/L (ref 1.15–1.40)
Calcium, Ion: 0.57 mmol/L — CL (ref 1.15–1.40)
Calcium, Ion: 1.13 mmol/L — ABNORMAL LOW (ref 1.15–1.40)
Calcium, Ion: 0.53 mmol/L — CL (ref 1.15–1.40)
Calcium, Ion: 1.13 mmol/L — ABNORMAL LOW (ref 1.15–1.40)
Calcium, Ion: 0.5 mmol/L — CL (ref 1.15–1.40)
Calcium, Ion: 1.13 mmol/L — ABNORMAL LOW (ref 1.15–1.40)
Calcium, Ion: 0.49 mmol/L — CL (ref 1.15–1.40)
Calcium, Ion: 1.1 mmol/L — ABNORMAL LOW (ref 1.15–1.40)
Calcium, Ion: 0.48 mmol/L — CL (ref 1.15–1.40)
Calcium, Ion: 1.04 mmol/L — ABNORMAL LOW (ref 1.15–1.40)
Chloride: 103 mmol/L (ref 98–111)
Chloride: 103 mmol/L (ref 98–111)
Chloride: 98 mmol/L (ref 98–111)
Chloride: 101 mmol/L (ref 98–111)
Chloride: 97 mmol/L — ABNORMAL LOW (ref 98–111)
Chloride: 101 mmol/L (ref 98–111)
Chloride: 96 mmol/L — ABNORMAL LOW (ref 98–111)
Chloride: 101 mmol/L (ref 98–111)
Chloride: 96 mmol/L — ABNORMAL LOW (ref 98–111)
Chloride: 102 mmol/L (ref 98–111)
Chloride: 93 mmol/L — ABNORMAL LOW (ref 98–111)
Creatinine, Ser: 2.1 mg/dL — ABNORMAL HIGH (ref 0.61–1.24)
Creatinine, Ser: 1.6 mg/dL — ABNORMAL HIGH (ref 0.61–1.24)
Creatinine, Ser: 1.9 mg/dL — ABNORMAL HIGH (ref 0.61–1.24)
Creatinine, Ser: 1.5 mg/dL — ABNORMAL HIGH (ref 0.61–1.24)
Creatinine, Ser: 1.9 mg/dL — ABNORMAL HIGH (ref 0.61–1.24)
Creatinine, Ser: 1.5 mg/dL — ABNORMAL HIGH (ref 0.61–1.24)
Creatinine, Ser: 1.8 mg/dL — ABNORMAL HIGH (ref 0.61–1.24)
Creatinine, Ser: 1.4 mg/dL — ABNORMAL HIGH (ref 0.61–1.24)
Creatinine, Ser: 1.7 mg/dL — ABNORMAL HIGH (ref 0.61–1.24)
Creatinine, Ser: 1.4 mg/dL — ABNORMAL HIGH (ref 0.61–1.24)
Creatinine, Ser: 1.7 mg/dL — ABNORMAL HIGH (ref 0.61–1.24)
Glucose, Bld: 384 mg/dL — ABNORMAL HIGH (ref 70–99)
Glucose, Bld: 242 mg/dL — ABNORMAL HIGH (ref 70–99)
Glucose, Bld: 255 mg/dL — ABNORMAL HIGH (ref 70–99)
Glucose, Bld: 184 mg/dL — ABNORMAL HIGH (ref 70–99)
Glucose, Bld: 211 mg/dL — ABNORMAL HIGH (ref 70–99)
Glucose, Bld: 122 mg/dL — ABNORMAL HIGH (ref 70–99)
Glucose, Bld: 152 mg/dL — ABNORMAL HIGH (ref 70–99)
Glucose, Bld: 172 mg/dL — ABNORMAL HIGH (ref 70–99)
Glucose, Bld: 232 mg/dL — ABNORMAL HIGH (ref 70–99)
Glucose, Bld: 140 mg/dL — ABNORMAL HIGH (ref 70–99)
Glucose, Bld: 89 mg/dL (ref 70–99)
HCT: 30 % — ABNORMAL LOW (ref 39.0–52.0)
HCT: 22 % — ABNORMAL LOW (ref 39.0–52.0)
HCT: 26 % — ABNORMAL LOW (ref 39.0–52.0)
HCT: 22 % — ABNORMAL LOW (ref 39.0–52.0)
HCT: 26 % — ABNORMAL LOW (ref 39.0–52.0)
HCT: 22 % — ABNORMAL LOW (ref 39.0–52.0)
HCT: 26 % — ABNORMAL LOW (ref 39.0–52.0)
HCT: 25 % — ABNORMAL LOW (ref 39.0–52.0)
HCT: 27 % — ABNORMAL LOW (ref 39.0–52.0)
HCT: 20 % — ABNORMAL LOW (ref 39.0–52.0)
HCT: 27 % — ABNORMAL LOW (ref 39.0–52.0)
Hemoglobin: 10.2 g/dL — ABNORMAL LOW (ref 13.0–17.0)
Hemoglobin: 7.5 g/dL — ABNORMAL LOW (ref 13.0–17.0)
Hemoglobin: 8.8 g/dL — ABNORMAL LOW (ref 13.0–17.0)
Hemoglobin: 7.5 g/dL — ABNORMAL LOW (ref 13.0–17.0)
Hemoglobin: 8.8 g/dL — ABNORMAL LOW (ref 13.0–17.0)
Hemoglobin: 7.5 g/dL — ABNORMAL LOW (ref 13.0–17.0)
Hemoglobin: 8.8 g/dL — ABNORMAL LOW (ref 13.0–17.0)
Hemoglobin: 8.5 g/dL — ABNORMAL LOW (ref 13.0–17.0)
Hemoglobin: 9.2 g/dL — ABNORMAL LOW (ref 13.0–17.0)
Hemoglobin: 6.8 g/dL — CL (ref 13.0–17.0)
Hemoglobin: 9.2 g/dL — ABNORMAL LOW (ref 13.0–17.0)
Potassium: 3.8 mmol/L (ref 3.5–5.1)
Potassium: 3.3 mmol/L — ABNORMAL LOW (ref 3.5–5.1)
Potassium: 3.4 mmol/L — ABNORMAL LOW (ref 3.5–5.1)
Potassium: 3.3 mmol/L — ABNORMAL LOW (ref 3.5–5.1)
Potassium: 3.5 mmol/L (ref 3.5–5.1)
Potassium: 3.6 mmol/L (ref 3.5–5.1)
Potassium: 3.8 mmol/L (ref 3.5–5.1)
Potassium: 3.5 mmol/L (ref 3.5–5.1)
Potassium: 3.6 mmol/L (ref 3.5–5.1)
Potassium: 3.2 mmol/L — ABNORMAL LOW (ref 3.5–5.1)
Potassium: 3.4 mmol/L — ABNORMAL LOW (ref 3.5–5.1)
Sodium: 137 mmol/L (ref 135–145)
Sodium: 138 mmol/L (ref 135–145)
Sodium: 140 mmol/L (ref 135–145)
Sodium: 138 mmol/L (ref 135–145)
Sodium: 140 mmol/L (ref 135–145)
Sodium: 138 mmol/L (ref 135–145)
Sodium: 141 mmol/L (ref 135–145)
Sodium: 137 mmol/L (ref 135–145)
Sodium: 140 mmol/L (ref 135–145)
Sodium: 135 mmol/L (ref 135–145)
Sodium: 141 mmol/L (ref 135–145)
TCO2: 21 mmol/L — ABNORMAL LOW (ref 22–32)
TCO2: 22 mmol/L (ref 22–32)
TCO2: 24 mmol/L (ref 22–32)
TCO2: 25 mmol/L (ref 22–32)
TCO2: 26 mmol/L (ref 22–32)
TCO2: 25 mmol/L (ref 22–32)
TCO2: 27 mmol/L (ref 22–32)
TCO2: 25 mmol/L (ref 22–32)
TCO2: 25 mmol/L (ref 22–32)
TCO2: 27 mmol/L (ref 22–32)
TCO2: 29 mmol/L (ref 22–32)

## 2023-07-25 LAB — GLUCOSE, CAPILLARY
Glucose-Capillary: 101 mg/dL — ABNORMAL HIGH (ref 70–99)
Glucose-Capillary: 105 mg/dL — ABNORMAL HIGH (ref 70–99)
Glucose-Capillary: 114 mg/dL — ABNORMAL HIGH (ref 70–99)
Glucose-Capillary: 127 mg/dL — ABNORMAL HIGH (ref 70–99)
Glucose-Capillary: 168 mg/dL — ABNORMAL HIGH (ref 70–99)
Glucose-Capillary: 175 mg/dL — ABNORMAL HIGH (ref 70–99)
Glucose-Capillary: 205 mg/dL — ABNORMAL HIGH (ref 70–99)
Glucose-Capillary: 231 mg/dL — ABNORMAL HIGH (ref 70–99)
Glucose-Capillary: 272 mg/dL — ABNORMAL HIGH (ref 70–99)
Glucose-Capillary: 321 mg/dL — ABNORMAL HIGH (ref 70–99)
Glucose-Capillary: 369 mg/dL — ABNORMAL HIGH (ref 70–99)
Glucose-Capillary: 71 mg/dL (ref 70–99)
Glucose-Capillary: 84 mg/dL (ref 70–99)

## 2023-07-25 LAB — TYPE AND SCREEN
ABO/RH(D): B POS
Antibody Screen: NEGATIVE
Unit division: 0
Unit division: 0

## 2023-07-25 LAB — RENAL FUNCTION PANEL
Albumin: 2.1 g/dL — ABNORMAL LOW (ref 3.5–5.0)
Albumin: 1.9 g/dL — ABNORMAL LOW (ref 3.5–5.0)
Anion gap: 8 (ref 5–15)
Anion gap: 9 (ref 5–15)
BUN: 23 mg/dL — ABNORMAL HIGH (ref 6–20)
BUN: 28 mg/dL — ABNORMAL HIGH (ref 6–20)
CO2: 23 mmol/L (ref 22–32)
CO2: 27 mmol/L (ref 22–32)
Calcium: 8.5 mg/dL — ABNORMAL LOW (ref 8.9–10.3)
Calcium: 9.1 mg/dL (ref 8.9–10.3)
Chloride: 103 mmol/L (ref 98–111)
Chloride: 102 mmol/L (ref 98–111)
Creatinine, Ser: 1.61 mg/dL — ABNORMAL HIGH (ref 0.61–1.24)
Creatinine, Ser: 1.85 mg/dL — ABNORMAL HIGH (ref 0.61–1.24)
GFR, Estimated: 54 mL/min — ABNORMAL LOW (ref >60)
GFR, Estimated: 46 mL/min — ABNORMAL LOW (ref >60)
Glucose, Bld: 269 mg/dL — ABNORMAL HIGH (ref 70–99)
Glucose, Bld: 175 mg/dL — ABNORMAL HIGH (ref 70–99)
Phosphorus: 1.5 mg/dL — ABNORMAL LOW (ref 2.5–4.6)
Phosphorus: 1.6 mg/dL — ABNORMAL LOW (ref 2.5–4.6)
Potassium: 4.1 mmol/L (ref 3.5–5.1)
Potassium: 3.3 mmol/L — ABNORMAL LOW (ref 3.5–5.1)
Sodium: 134 mmol/L — ABNORMAL LOW (ref 135–145)
Sodium: 138 mmol/L (ref 135–145)

## 2023-07-25 LAB — BPAM RBC
Blood Product Expiration Date: 202411182359
Blood Product Expiration Date: 202411182359
ISSUE DATE / TIME: 202410251811
ISSUE DATE / TIME: 202410260602
Unit Type and Rh: 7300
Unit Type and Rh: 7300

## 2023-07-25 LAB — APTT: aPTT: 41 s — ABNORMAL HIGH (ref 24–36)

## 2023-07-25 LAB — MAGNESIUM: Magnesium: 2.4 mg/dL (ref 1.7–2.4)

## 2023-07-25 MED ORDER — DEXTROSE 5 % IV SOLN
20.0000 g | INTRAVENOUS | Status: DC
  Administered 2023-07-25 – 2023-07-27 (×4): 20 g via INTRAVENOUS_CENTRAL
  Filled 2023-07-25 (×6): qty 200

## 2023-07-25 MED ORDER — PRISMASOL BGK 4/2.5 32-4-2.5 MEQ/L REPLACEMENT SOLN: Status: DC

## 2023-07-25 MED ORDER — ACD FORMULA A 0.73-2.45-2.2 GM/100ML VI SOLN
3000.0000 mL | Status: DC
  Administered 2023-07-25 – 2023-07-28 (×7): 3000 mL via INTRAVENOUS_CENTRAL
  Filled 2023-07-25 (×10): qty 3000

## 2023-07-25 MED ORDER — POTASSIUM & SODIUM PHOSPHATES 280-160-250 MG PO PACK
2.0000 | PACK | Freq: Two times a day (BID) | ORAL | Status: AC
  Administered 2023-07-25 – 2023-07-26 (×2): 2 via ORAL
  Filled 2023-07-25 (×2): qty 2

## 2023-07-25 MED ORDER — INSULIN ASPART 100 UNIT/ML IJ SOLN
3.0000 [IU] | INTRAMUSCULAR | Status: DC
  Administered 2023-07-25 (×4): 3 [IU] via SUBCUTANEOUS

## 2023-07-25 MED ORDER — SODIUM PHOSPHATES 45 MMOLE/15ML IV SOLN
30.0000 mmol | Freq: Once | INTRAVENOUS | Status: AC
  Administered 2023-07-25: 30 mmol via INTRAVENOUS
  Filled 2023-07-25: qty 10

## 2023-07-25 NOTE — Progress Notes (Signed)
   Patient Name: CODDY MANSIR Date of Encounter: 07/25/2023 [facility] Cardiologist: None   Interval Summary  .    Spoke to mother at length.  Intermittently he has initiated conversation.  Remembers watching a movie on iPad for instance.  Moved his bed upright when daughter came into the room.  Vital Signs .    Vitals:   07/25/23 1015 07/25/23 1030 07/25/23 1045 07/25/23 1100  BP:      Pulse: (!) 110 (!) 113 (!) 115 (!) 108  Resp: 20 (!) 27 (!) 33 (!) 41  Temp:      TempSrc:      SpO2: 98% 100% 98% 94%  Weight:      Height:        Intake/Output Summary (Last 24 hours) at 07/25/2023 1232 Last data filed at 07/25/2023 1100 Gross per 24 hour  Intake 2929.13 ml  Output 2494.2 ml  Net 434.93 ml      07/25/2023    5:00 AM 07/24/2023    5:00 AM 07/23/2023    5:00 AM  Last 3 Weights  Weight (lbs) 151 lb 14.4 oz 146 lb 9.7 oz 145 lb 15.1 oz  Weight (kg) 68.9 kg 66.5 kg 66.2 kg      Telemetry/ECG    Sinus rhythm, sinus tachycardia - Personally Reviewed  Physical Exam .    In bed, Corpak in place, resting comfortably Dialysis team in room Left AKA Left hemiparesis 1/6 apical holosystolic murmur   Assessment & Plan .     42 year old male with mitral valve endocarditis MRSA bacteremia Acute systolic heart failure-EF 35 to 40% Metabolic encephalopathy Acute right MCA stroke septic infarct with left-sided hemiparesis Type 2 diabetes since age 83 End-stage renal disease on hemodialysis currently CVVHD Left upper lobe cavitary pneumonia Liver mass consider possible abscess Left AKA  -Continue supportive care -Spoke with mother at length.  He responded well to his daughter being here.  Intermittently conversant with family members.  Showing some signs of lucidity. -Recommend that tomorrow, we can reach back out to [facility] to see if there is any potential availability for transfer and for potential high risk mitral valve surgery given his very  large and very mobile mitral vegetation. -Continues with low-dose norepinephrine for pressor support. -Occasional hypoglycemia  CRITICAL CARE Performed by: Donato Schultz   Total critical care time: 35 minutes  Critical care time was exclusive of separately billable procedures and treating other patients.  Critical care was necessary to treat or prevent imminent or life-threatening deterioration.  Critical care was time spent personally by me on the following activities: development of treatment plan with patient and/or surrogate as well as nursing, discussions with consultants, evaluation of patient's response to treatment, examination of patient, obtaining history from patient or surrogate, ordering and performing treatments and interventions, ordering and review of laboratory studies, ordering and review of radiographic studies, pulse oximetry and re-evaluation of patient's condition.     For questions or updates, please contact [facility] Please consult [email or web address] for contact info under        Signed, Donato Schultz, MD

## 2023-07-25 NOTE — Progress Notes (Signed)
NAME:  [name], MRN:  [id number], DOB:  03-Dec-1980, LOS: 15 ADMISSION DATE:  07/09/2023, CONSULTATION DATE:  07/10/2023 REFERRING MD:  Suezanne Jacquet, CHIEF COMPLAINT:  DKA, AMS   History of Present Illness:  Patient is a 42 yo M w/ pertinent PMH ESRD on HD MWF, IDDM, gastroparesis, left AKA, HTN presents to [facility] on 10/11 w/ AMS.   Patient recently admitted to [facility] on 7/12 sepsis from right diabetic foot ulcer. Cultures w/ MRSA. Patient had dialysis catheter removed and replaced. ID treated w/ vanc. Patient refused echo and left on 7/17 against medical advise.    On 10/11 patient was found by family member on ground altered. EMS arrived and dialysis catheter partially removed. Patient more alert for them. Has been complaining of diarrhea x1 week per family. Unsure if patient went to dialysis today. Transferred to [facility] ED. On arrival bp stable 107/70 and afebrile. Patient remains confused and occasionally with agitation. Given dilaudid. CBG 497. Beta H >8. VBG ph 7.06, 16, 54, 4.7. Given iv fluids and started on insulin drip for DKA. CT head negative for acute abnormality. Cxr no significant findings. CT abd/pelvis no acute findings; possible steatosis vs. Liver mass 7 x 4 cm recommending f/u w/ MRI. WBC 20.7 and LA 2.4. Sepsis protocol initiated. Cultures obtained and started on vanc/zosyn. Given ams, dka, and soft bp pccm consulted for icu admission.   10/21 pccm called overnight to assess for worsening hypoxia and AMS after vomiting. On NRB w/ sats 70-80s. Hypoglycemic given dextrose which improved his mental status some. Transferred to 2H icu for intubation.     Pertinent  Medical History  ESRD on HD, HTN, diabetes, diabetic gastroparesis, cellulitis of left foot, left AKA  Significant Hospital Events: Including procedures, antibiotic start and stop dates in addition to other pertinent events   10/12: altered mental status, DKA 10/12: blood cultures positive for MRSA 10/21: resp failure  requiring intubation 10/22- TEE 10/22 with EF 35%, LV global hypokinesis. Very large vegetation lateral scallop of posterior mitral leaflet 3.9cm length 1 cm width, highly mobile, prolapsing into LV  10/23: per nursing less responsive than yesterday, increased ETT secretions. Clotting off CRRT. Ct head showing expected evolution of the right MCA stroke.  10/24 agitated when sedation decreased, is purposeful but does not follow commands. Tolerating UF gaols of -150. Still on NE infusion  10/25 failed swallow eval still on just 3 lpm O2. Encephalopathic. Added seroquel in effort to get him off the precedex. Cardiology speaking to [facility] today  10/26 hemoglobin again dropped to 6.3 overnight with increased pressor requirement yesterday afternoon now on vaso and levo. 10/27 hypoglycemic episode overnight   Interim History / Subjective:  More alert this am able to state name   Objective   Blood pressure (!) 123/56, pulse 89, temperature 99.5 F (37.5 C), temperature source Oral, resp. rate (!) 26, height 5\' 7"  (1.702 m), weight 68.9 kg, SpO2 100%.        Intake/Output Summary (Last 24 hours) at 07/25/2023 0717 Last data filed at 07/25/2023 0700 Gross per 24 hour  Intake 3458.8 ml  Output 2850 ml  Net 608.8 ml   Filed Weights   07/23/23 0500 07/24/23 0500 07/25/23 0500  Weight: 66.2 kg 66.5 kg 68.9 kg    Examination: General: Acute on chronically ill appearing elderly  male/male on mechanical ventilation, in NAD HEENT: ETT, MM pink/moist, PERRL,  Neuro: Alert and oriented x1 CV: s1s2 regular rate and rhythm, no murmur, rubs, or gallops,  PULM:  Clear to auscultation, no increased work of breathing, no added breath sounds  GI: soft, bowel sounds active in all 4 quadrants, non-tender, non-distended, tolerating TF Extremities: warm/dry, no edema  Skin: no rashes or lesions  Resolved Hospital Problem list   DKA  Septic shock   Assessment & Plan:  Acute hypoxic respiratory failure;  due to aspiration PNA, evolving cavitary PNA and mucous plugging  -cxr has been stable.  -LUL cavitary lesion unchanged.  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  HRrEF P: Remains on vancomycin with extended course  Pressors for MAP goa  65 Continue Midodrine   Acute metabolic encephalopathy w/ agitated delirium  Acute Right MCA  stroke/septic infarct w/ left sided hemiparesis   -10/17- neuro has signed off and recommended outpatient follow up -CT follow up w/ expected evolution of stroke P: Maintain neuro protective measures Nutrition and bowel regiment  Aspirations precautions  Supportive care  Delirium precautions  Continue Seroquel and low dose Klonopin Wean Precedex as able PT/OT as able   Dysphagia P: NPO Continue Cortrak  SLP following   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  Cardiogenic shock  P: Continuous telemetry  Strict intake and output  Daily weight  Antibiotics as above  Volume removal per CRRT Closely monitor renal function and electrolytes Supplemental oxygen as needed  Pressors as above  GDMT as able  Pending transfer to [facility] for valvular repair once bed available      T2DM w/ hyperglycemia  P: Continue SSI moderate scale  Continue long acting  Add back lower dose mea coverage  CBG goal 140-180 CBG checks q4  End-stage renal disease on hemodialysis P: Nephrology following, appreciated assistance  Follow renal function  Monitor urine output Trend Bmet Avoid nephrotoxins Ensure adequate renal perfusion   Anemia/thrombocytopenia P: Trend CBC  Transfuse per protocol HGB goal > 7  Incidental finding of Liver steatosis vs. Liver mass -CT abd/pelvis showing 7 x 4 cm density in  anterior liver  P: Follow up outpatient   Best Practice (right click and "Reselect all SmartList Selections" daily)   Diet/type: tubefeeds DVT prophylaxis: SCD GI prophylaxis: N/A Lines: Central line, Dialysis Catheter, Arterial Line, and yes and it is still needed Foley:  Yes, and it is still needed Code Status:  full code Last date of multidisciplinary goals of care discussion Patient and mom updated at bedside am 10/27   Critical care time:    Performed by: Gillian Kluever D. Harris  Total critical care time: 38  minutes  Critical care time was exclusive of separately billable procedures and treating other patients.  Critical care was necessary to treat or prevent imminent or life-threatening deterioration.  Critical care was time spent personally by me on the following activities: development of treatment plan with patient and/or surrogate as well as nursing, discussions with consultants, evaluation of patient's response to treatment, examination of patient, obtaining history from patient or surrogate, ordering and performing treatments and interventions, ordering and review of laboratory studies, ordering and review of radiographic studies, pulse oximetry and re-evaluation of patient's condition.  Shyne Resch D. Harris, NP-C [facility] Personal contact information can be found on Amion  If no contact or response made please call [phone] 07/25/2023, 7:17 AM

## 2023-07-25 NOTE — Progress Notes (Signed)
[facility] NEPHROLOGY PROGRESS NOTE  Assessment/ Plan:  MRSA bacteremia with MV endocarditis -s/p line holiday, TDC placed by IR 10/18 -on vanc and cefepime -per primary service and CTS-high risk for surgery, may need transfer to higher level of care to consider surgery but will need to be extubated first which he is  Acute ischemic CVA/septic infarct 10/17 -neuro signed off, per primary service  AHRF -possibly secondary to aspiration. Intubated 10/21, extubated 10/24 -per PCCM -will cont to UF as tolerated with renal replacement therapy  Shock -pressor support per primary serivce  ESRD -typically on HD MWF -given ongoing shock, started CRRT 10/22 -UFG: net even to net neg 50cc/hr -pre and post: 2k; D: 4k -A/C: stop heparin through circuit and start citrate protocol. Discussed with pharmacy and ICU RN -c/w CRRT  Dialysis access - Per IR note, occluded both right and left subclavian on venography therefore successful placement of left femoral TDC with the tip of the catheter in the infrarenal IVC.  HTN/volume - UF as tolerated. On pressors as above. 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 Transfuse PRN for hgb <7. On ESA -questionable hemolysis from endocarditis? Will defer to primary service for workup of anemia  CKD MBD-  calcitriol per tube. Resume sensipar when taking PO but will see how is calcium is doing first. Replete PO4 PRN, holding binders given hypophos  Thrombocytopenia -w/u per primary. Secondary to HIT vs sepsis? Stop heparin thru circuit, start citrate protocol . Marland Kitchen OP HD: MWF [facility] 4h  400/1.5    77kg   2/3 bath  [facility] TDC    Heparin none - last OP HD 10/7, post wt 74.6kg - coming off 75- 76kg - misses HD once every other week approx - rocaltrol 1.50 mcg  - no esa . Marland Kitchen Discussed with patient's mother, ICU RN, and pharmacy.  Anthony Sar,  MD [facility]  Subjective:  Seen and examined in ICU. CRRT currently off. Recurrent clotting despite highest dose fixed heparin. Net postitive ~0.6L Pressors: levo, vaso  Objective Vital signs in last 24 hours: Vitals:   07/25/23 0630 07/25/23 0645 07/25/23 0700 07/25/23 0746  BP:      Pulse: 89 92 89   Resp: (!) 21 19 (!) 26   Temp:    (!) 100.8 F (38.2 C)  TempSrc:    Axillary  SpO2: 100% 100% 100%   Weight:      Height:       Weight change: 2.4 kg  Intake/Output Summary (Last 24 hours) at 07/25/2023 0824 Last data filed at 07/25/2023 0700 Gross per 24 hour  Intake 3378.13 ml  Output 2700 ml  Net 678.13 ml       Labs: RENAL PANEL Recent Labs  Lab 07/21/23 0327 07/21/23 0341 07/22/23 0437 07/22/23 0444 07/23/23 0415 07/23/23 1630 07/23/23 1756 07/24/23 0407 07/24/23 1104 07/24/23 1539 07/25/23 0403  NA 131*   < > 132*   < > 134* 132* 131* 135  --  135 134*  K 3.7   < > 4.1   < > 4.3 4.1 4.5 4.0  --  3.9 4.1  CL 98   < > 101   < > 101 101  --  100  --  100 103  CO2 23   < > 23   < > 23 18*  --  25  --  24 23  GLUCOSE 246*   < > 305*   < > 156* 165*  --  133*  --  322* 269*  BUN 35*   < > 23*   < > 17 18  --  18  --  23* 23*  CREATININE 6.46*   < > 3.49*   < > 2.19* 2.09*  --  1.58*  --  1.89* 1.61*  CALCIUM 7.3*   < > 7.9*   < > 8.4* 8.2*  --  8.6*  --  8.6* 8.5*  MG 2.5*  --  2.6*  --  2.5*  --   --  2.6*  --   --  2.4  PHOS 3.4   < > 2.2*   < > 2.5 2.7  --  2.0*  --  1.9* 1.5*  ALBUMIN 1.6*   < > 1.7*   < > 1.8* 1.8*  --  2.2*  2.2* 2.1* 2.1* 2.1*   < > = values in this interval not displayed.    Liver Function Tests: Recent Labs  Lab 07/19/23 0958 07/20/23 1638 07/24/23 0407 07/24/23 1104 07/24/23 1539 07/25/23 0403  AST 22  --  19 18  --   --   ALT 7  --  7 6  --   --   ALKPHOS 169*  --  145* 135*  --   --   BILITOT 1.3*  --  2.8* 2.9*  --   --   PROT 7.4  --  7.5 7.0  --   --   ALBUMIN 2.2*   < > 2.2*  2.2* 2.1* 2.1*  2.1*   < > = values in this interval not displayed.   No results for input(s): "LIPASE", "AMYLASE" in the last 168 hours. Recent Labs  Lab 07/24/23 1048  AMMONIA 23    CBC: Recent Labs    07/23/23 2047 07/24/23 0407 07/24/23 1342 07/24/23 2030 07/25/23 0403  HGB 7.4* 6.3* 6.8* 7.7* 7.6*  MCV 93.9 94.4 92.8  --  93.5    Cardiac Enzymes: No results for input(s): "CKTOTAL", "CKMB", "CKMBINDEX", "TROPONINI" in the last 168 hours. CBG: Recent Labs  Lab 07/25/23 0103 07/25/23 0200 07/25/23 0249 07/25/23 0403 07/25/23 0744  GLUCAP 114* 84 168* 272* 105*    Iron Studies: No results for input(s): "IRON", "TIBC", "TRANSFERRIN", "FERRITIN" in the last 72 hours. Studies/Results: DG Abd Portable 1V  Result Date: 07/23/2023 CLINICAL DATA:  [id number] Encounter for feeding tube placement [id number] EXAM: PORTABLE ABDOMEN - 1 VIEW COMPARISON:  None Available. FINDINGS: Lung bases are clear. Weighted enteric tube courses below diaphragm with the tip terminating near the pylorus. Paucity of small bowel gas limits the ability to assess for obstruction. No dilated loops of bowel are visualized. No acute osseous abnormality. IMPRESSION: Weighted enteric tube courses below diaphragm with the tip terminating near the pylorus. Electronically Signed   By: Lorenza Cambridge M.D.   On: 07/23/2023 12:31    Medications: Infusions:  ceFEPime (MAXIPIME) IV 200 mL/hr at 07/25/23 0700   dexmedetomidine (PRECEDEX) IV infusion 1.2 mcg/kg/hr (07/25/23 0700)   feeding supplement (OSMOLITE 1.5 CAL) 55 mL/hr at 07/25/23 0700   heparin 10,000 units/ 20 mL infusion syringe 750 Units/hr (07/25/23 0400)   norepinephrine (LEVOPHED) 16 mg in dextrose 5 % 250 mL (0.064 mg/mL) infusion 1 mcg/min (07/25/23 0700)   prismasol BGK 2/2.5 replacement solution 400 mL/hr at 07/24/23 1839   prismasol BGK 2/2.5 replacement solution 500 mL/hr at 07/25/23 0506   prismasol BGK 4/2.5  1,500 mL/hr at 07/25/23 0428   vancomycin Stopped  (07/24/23 1140)   vasopressin 0.03 Units/min (07/25/23 0700)    Scheduled Medications:  calcitRIOL  1.5 mcg Per Tube Q M,W,F   Chlorhexidine Gluconate Cloth  6 each Topical Q0600   clonazePAM  0.5 mg Oral BID   darbepoetin (ARANESP) injection - DIALYSIS  40 mcg Subcutaneous Q Fri-1800   feeding supplement (PROSource TF20)  60 mL Per Tube QID   fiber supplement (BANATROL TF)  60 mL Per Tube BID   gabapentin  100 mg Per Tube Q8H   insulin aspart  0-15 Units Subcutaneous Q4H   insulin aspart  3 Units Subcutaneous Q4H   insulin glargine-yfgn  10 Units Subcutaneous Daily   multivitamin  1 tablet Per Tube BID   mouth rinse  15 mL Mouth Rinse 4 times per day   QUEtiapine  25 mg Per Tube BID   thiamine  100 mg Per Tube Daily    have reviewed scheduled and prn medications.  Physical Exam: General: ill appearing Heart:RRR, +systolic murmur Lungs: b/l chest expansion, normal wob Abdomen:soft, Non-tender, non-distended Extremities:No edema, left aka Neuro: awake, following some commands Dialysis Access: left femoral TDC. CRRT filter and tubing with clots/fibrin throughout  [place] 07/25/2023,8:24 AM  LOS: 15 days

## 2023-07-26 DIAGNOSIS — R6521 Severe sepsis with septic shock: Secondary | ICD-10-CM | POA: Diagnosis not present

## 2023-07-26 DIAGNOSIS — I059 Rheumatic mitral valve disease, unspecified: Secondary | ICD-10-CM | POA: Diagnosis not present

## 2023-07-26 DIAGNOSIS — A419 Sepsis, unspecified organism: Secondary | ICD-10-CM | POA: Diagnosis not present

## 2023-07-26 LAB — POCT I-STAT, CHEM 8
BUN: 20 mg/dL (ref 6–20)
BUN: 22 mg/dL — ABNORMAL HIGH (ref 6–20)
BUN: 22 mg/dL — ABNORMAL HIGH (ref 6–20)
BUN: 22 mg/dL — ABNORMAL HIGH (ref 6–20)
BUN: 23 mg/dL — ABNORMAL HIGH (ref 6–20)
BUN: 23 mg/dL — ABNORMAL HIGH (ref 6–20)
BUN: 23 mg/dL — ABNORMAL HIGH (ref 6–20)
BUN: 23 mg/dL — ABNORMAL HIGH (ref 6–20)
BUN: 24 mg/dL — ABNORMAL HIGH (ref 6–20)
BUN: 24 mg/dL — ABNORMAL HIGH (ref 6–20)
BUN: 24 mg/dL — ABNORMAL HIGH (ref 6–20)
BUN: 24 mg/dL — ABNORMAL HIGH (ref 6–20)
BUN: 25 mg/dL — ABNORMAL HIGH (ref 6–20)
BUN: 25 mg/dL — ABNORMAL HIGH (ref 6–20)
BUN: 26 mg/dL — ABNORMAL HIGH (ref 6–20)
BUN: 26 mg/dL — ABNORMAL HIGH (ref 6–20)
BUN: 27 mg/dL — ABNORMAL HIGH (ref 6–20)
BUN: 28 mg/dL — ABNORMAL HIGH (ref 6–20)
Calcium, Ion: 0.36 mmol/L — CL (ref 1.15–1.40)
Calcium, Ion: 0.37 mmol/L — CL (ref 1.15–1.40)
Calcium, Ion: 0.39 mmol/L — CL (ref 1.15–1.40)
Calcium, Ion: 0.39 mmol/L — CL (ref 1.15–1.40)
Calcium, Ion: 0.39 mmol/L — CL (ref 1.15–1.40)
Calcium, Ion: 0.42 mmol/L — CL (ref 1.15–1.40)
Calcium, Ion: 0.42 mmol/L — CL (ref 1.15–1.40)
Calcium, Ion: 0.43 mmol/L — CL (ref 1.15–1.40)
Calcium, Ion: 0.45 mmol/L — CL (ref 1.15–1.40)
Calcium, Ion: 1.02 mmol/L — ABNORMAL LOW (ref 1.15–1.40)
Calcium, Ion: 1.03 mmol/L — ABNORMAL LOW (ref 1.15–1.40)
Calcium, Ion: 1.03 mmol/L — ABNORMAL LOW (ref 1.15–1.40)
Calcium, Ion: 1.04 mmol/L — ABNORMAL LOW (ref 1.15–1.40)
Calcium, Ion: 1.05 mmol/L — ABNORMAL LOW (ref 1.15–1.40)
Calcium, Ion: 1.06 mmol/L — ABNORMAL LOW (ref 1.15–1.40)
Calcium, Ion: 1.06 mmol/L — ABNORMAL LOW (ref 1.15–1.40)
Calcium, Ion: 1.09 mmol/L — ABNORMAL LOW (ref 1.15–1.40)
Calcium, Ion: 1.1 mmol/L — ABNORMAL LOW (ref 1.15–1.40)
Chloride: 88 mmol/L — ABNORMAL LOW (ref 98–111)
Chloride: 89 mmol/L — ABNORMAL LOW (ref 98–111)
Chloride: 89 mmol/L — ABNORMAL LOW (ref 98–111)
Chloride: 89 mmol/L — ABNORMAL LOW (ref 98–111)
Chloride: 89 mmol/L — ABNORMAL LOW (ref 98–111)
Chloride: 90 mmol/L — ABNORMAL LOW (ref 98–111)
Chloride: 90 mmol/L — ABNORMAL LOW (ref 98–111)
Chloride: 91 mmol/L — ABNORMAL LOW (ref 98–111)
Chloride: 91 mmol/L — ABNORMAL LOW (ref 98–111)
Chloride: 92 mmol/L — ABNORMAL LOW (ref 98–111)
Chloride: 92 mmol/L — ABNORMAL LOW (ref 98–111)
Chloride: 92 mmol/L — ABNORMAL LOW (ref 98–111)
Chloride: 92 mmol/L — ABNORMAL LOW (ref 98–111)
Chloride: 93 mmol/L — ABNORMAL LOW (ref 98–111)
Chloride: 94 mmol/L — ABNORMAL LOW (ref 98–111)
Chloride: 94 mmol/L — ABNORMAL LOW (ref 98–111)
Chloride: 95 mmol/L — ABNORMAL LOW (ref 98–111)
Chloride: 97 mmol/L — ABNORMAL LOW (ref 98–111)
Creatinine, Ser: 1.1 mg/dL (ref 0.61–1.24)
Creatinine, Ser: 1.1 mg/dL (ref 0.61–1.24)
Creatinine, Ser: 1.2 mg/dL (ref 0.61–1.24)
Creatinine, Ser: 1.2 mg/dL (ref 0.61–1.24)
Creatinine, Ser: 1.2 mg/dL (ref 0.61–1.24)
Creatinine, Ser: 1.2 mg/dL (ref 0.61–1.24)
Creatinine, Ser: 1.3 mg/dL — ABNORMAL HIGH (ref 0.61–1.24)
Creatinine, Ser: 1.3 mg/dL — ABNORMAL HIGH (ref 0.61–1.24)
Creatinine, Ser: 1.4 mg/dL — ABNORMAL HIGH (ref 0.61–1.24)
Creatinine, Ser: 1.4 mg/dL — ABNORMAL HIGH (ref 0.61–1.24)
Creatinine, Ser: 1.4 mg/dL — ABNORMAL HIGH (ref 0.61–1.24)
Creatinine, Ser: 1.5 mg/dL — ABNORMAL HIGH (ref 0.61–1.24)
Creatinine, Ser: 1.5 mg/dL — ABNORMAL HIGH (ref 0.61–1.24)
Creatinine, Ser: 1.6 mg/dL — ABNORMAL HIGH (ref 0.61–1.24)
Creatinine, Ser: 1.6 mg/dL — ABNORMAL HIGH (ref 0.61–1.24)
Creatinine, Ser: 1.6 mg/dL — ABNORMAL HIGH (ref 0.61–1.24)
Creatinine, Ser: 1.6 mg/dL — ABNORMAL HIGH (ref 0.61–1.24)
Creatinine, Ser: 1.7 mg/dL — ABNORMAL HIGH (ref 0.61–1.24)
Glucose, Bld: 119 mg/dL — ABNORMAL HIGH (ref 70–99)
Glucose, Bld: 128 mg/dL — ABNORMAL HIGH (ref 70–99)
Glucose, Bld: 135 mg/dL — ABNORMAL HIGH (ref 70–99)
Glucose, Bld: 167 mg/dL — ABNORMAL HIGH (ref 70–99)
Glucose, Bld: 173 mg/dL — ABNORMAL HIGH (ref 70–99)
Glucose, Bld: 187 mg/dL — ABNORMAL HIGH (ref 70–99)
Glucose, Bld: 209 mg/dL — ABNORMAL HIGH (ref 70–99)
Glucose, Bld: 219 mg/dL — ABNORMAL HIGH (ref 70–99)
Glucose, Bld: 229 mg/dL — ABNORMAL HIGH (ref 70–99)
Glucose, Bld: 273 mg/dL — ABNORMAL HIGH (ref 70–99)
Glucose, Bld: 286 mg/dL — ABNORMAL HIGH (ref 70–99)
Glucose, Bld: 287 mg/dL — ABNORMAL HIGH (ref 70–99)
Glucose, Bld: 292 mg/dL — ABNORMAL HIGH (ref 70–99)
Glucose, Bld: 293 mg/dL — ABNORMAL HIGH (ref 70–99)
Glucose, Bld: 306 mg/dL — ABNORMAL HIGH (ref 70–99)
Glucose, Bld: 583 mg/dL (ref 70–99)
Glucose, Bld: 628 mg/dL (ref 70–99)
Glucose, Bld: 70 mg/dL (ref 70–99)
HCT: 20 % — ABNORMAL LOW (ref 39.0–52.0)
HCT: 21 % — ABNORMAL LOW (ref 39.0–52.0)
HCT: 21 % — ABNORMAL LOW (ref 39.0–52.0)
HCT: 22 % — ABNORMAL LOW (ref 39.0–52.0)
HCT: 24 % — ABNORMAL LOW (ref 39.0–52.0)
HCT: 24 % — ABNORMAL LOW (ref 39.0–52.0)
HCT: 25 % — ABNORMAL LOW (ref 39.0–52.0)
HCT: 25 % — ABNORMAL LOW (ref 39.0–52.0)
HCT: 26 % — ABNORMAL LOW (ref 39.0–52.0)
HCT: 26 % — ABNORMAL LOW (ref 39.0–52.0)
HCT: 29 % — ABNORMAL LOW (ref 39.0–52.0)
HCT: 29 % — ABNORMAL LOW (ref 39.0–52.0)
HCT: 30 % — ABNORMAL LOW (ref 39.0–52.0)
HCT: 30 % — ABNORMAL LOW (ref 39.0–52.0)
HCT: 31 % — ABNORMAL LOW (ref 39.0–52.0)
HCT: 31 % — ABNORMAL LOW (ref 39.0–52.0)
HCT: 32 % — ABNORMAL LOW (ref 39.0–52.0)
HCT: 33 % — ABNORMAL LOW (ref 39.0–52.0)
Hemoglobin: 10.2 g/dL — ABNORMAL LOW (ref 13.0–17.0)
Hemoglobin: 10.2 g/dL — ABNORMAL LOW (ref 13.0–17.0)
Hemoglobin: 10.5 g/dL — ABNORMAL LOW (ref 13.0–17.0)
Hemoglobin: 10.5 g/dL — ABNORMAL LOW (ref 13.0–17.0)
Hemoglobin: 10.9 g/dL — ABNORMAL LOW (ref 13.0–17.0)
Hemoglobin: 11.2 g/dL — ABNORMAL LOW (ref 13.0–17.0)
Hemoglobin: 6.8 g/dL — CL (ref 13.0–17.0)
Hemoglobin: 7.1 g/dL — ABNORMAL LOW (ref 13.0–17.0)
Hemoglobin: 7.1 g/dL — ABNORMAL LOW (ref 13.0–17.0)
Hemoglobin: 7.5 g/dL — ABNORMAL LOW (ref 13.0–17.0)
Hemoglobin: 8.2 g/dL — ABNORMAL LOW (ref 13.0–17.0)
Hemoglobin: 8.2 g/dL — ABNORMAL LOW (ref 13.0–17.0)
Hemoglobin: 8.5 g/dL — ABNORMAL LOW (ref 13.0–17.0)
Hemoglobin: 8.5 g/dL — ABNORMAL LOW (ref 13.0–17.0)
Hemoglobin: 8.8 g/dL — ABNORMAL LOW (ref 13.0–17.0)
Hemoglobin: 8.8 g/dL — ABNORMAL LOW (ref 13.0–17.0)
Hemoglobin: 9.9 g/dL — ABNORMAL LOW (ref 13.0–17.0)
Hemoglobin: 9.9 g/dL — ABNORMAL LOW (ref 13.0–17.0)
Potassium: 3.4 mmol/L — ABNORMAL LOW (ref 3.5–5.1)
Potassium: 3.4 mmol/L — ABNORMAL LOW (ref 3.5–5.1)
Potassium: 3.4 mmol/L — ABNORMAL LOW (ref 3.5–5.1)
Potassium: 3.4 mmol/L — ABNORMAL LOW (ref 3.5–5.1)
Potassium: 3.5 mmol/L (ref 3.5–5.1)
Potassium: 3.5 mmol/L (ref 3.5–5.1)
Potassium: 3.5 mmol/L (ref 3.5–5.1)
Potassium: 3.6 mmol/L (ref 3.5–5.1)
Potassium: 3.6 mmol/L (ref 3.5–5.1)
Potassium: 3.6 mmol/L (ref 3.5–5.1)
Potassium: 3.7 mmol/L (ref 3.5–5.1)
Potassium: 3.7 mmol/L (ref 3.5–5.1)
Potassium: 3.7 mmol/L (ref 3.5–5.1)
Potassium: 3.9 mmol/L (ref 3.5–5.1)
Potassium: 4 mmol/L (ref 3.5–5.1)
Potassium: 4 mmol/L (ref 3.5–5.1)
Potassium: 4.1 mmol/L (ref 3.5–5.1)
Potassium: 4.1 mmol/L (ref 3.5–5.1)
Sodium: 134 mmol/L — ABNORMAL LOW (ref 135–145)
Sodium: 137 mmol/L (ref 135–145)
Sodium: 137 mmol/L (ref 135–145)
Sodium: 138 mmol/L (ref 135–145)
Sodium: 138 mmol/L (ref 135–145)
Sodium: 138 mmol/L (ref 135–145)
Sodium: 138 mmol/L (ref 135–145)
Sodium: 139 mmol/L (ref 135–145)
Sodium: 140 mmol/L (ref 135–145)
Sodium: 140 mmol/L (ref 135–145)
Sodium: 140 mmol/L (ref 135–145)
Sodium: 140 mmol/L (ref 135–145)
Sodium: 140 mmol/L (ref 135–145)
Sodium: 140 mmol/L (ref 135–145)
Sodium: 140 mmol/L (ref 135–145)
Sodium: 141 mmol/L (ref 135–145)
Sodium: 141 mmol/L (ref 135–145)
Sodium: 141 mmol/L (ref 135–145)
TCO2: 29 mmol/L (ref 22–32)
TCO2: 29 mmol/L (ref 22–32)
TCO2: 29 mmol/L (ref 22–32)
TCO2: 30 mmol/L (ref 22–32)
TCO2: 30 mmol/L (ref 22–32)
TCO2: 30 mmol/L (ref 22–32)
TCO2: 31 mmol/L (ref 22–32)
TCO2: 31 mmol/L (ref 22–32)
TCO2: 31 mmol/L (ref 22–32)
TCO2: 31 mmol/L (ref 22–32)
TCO2: 31 mmol/L (ref 22–32)
TCO2: 31 mmol/L (ref 22–32)
TCO2: 31 mmol/L (ref 22–32)
TCO2: 31 mmol/L (ref 22–32)
TCO2: 31 mmol/L (ref 22–32)
TCO2: 32 mmol/L (ref 22–32)
TCO2: 33 mmol/L — ABNORMAL HIGH (ref 22–32)
TCO2: 33 mmol/L — ABNORMAL HIGH (ref 22–32)

## 2023-07-26 LAB — RENAL FUNCTION PANEL
Albumin: 1.9 g/dL — ABNORMAL LOW (ref 3.5–5.0)
Albumin: 2.1 g/dL — ABNORMAL LOW (ref 3.5–5.0)
Anion gap: 12 (ref 5–15)
Anion gap: 14 (ref 5–15)
BUN: 24 mg/dL — ABNORMAL HIGH (ref 6–20)
BUN: 26 mg/dL — ABNORMAL HIGH (ref 6–20)
CO2: 32 mmol/L (ref 22–32)
CO2: 30 mmol/L (ref 22–32)
Calcium: 9.1 mg/dL (ref 8.9–10.3)
Calcium: 9.2 mg/dL (ref 8.9–10.3)
Chloride: 94 mmol/L — ABNORMAL LOW (ref 98–111)
Chloride: 91 mmol/L — ABNORMAL LOW (ref 98–111)
Creatinine, Ser: 1.76 mg/dL — ABNORMAL HIGH (ref 0.61–1.24)
Creatinine, Ser: 1.77 mg/dL — ABNORMAL HIGH (ref 0.61–1.24)
GFR, Estimated: 49 mL/min — ABNORMAL LOW (ref >60)
GFR, Estimated: 48 mL/min — ABNORMAL LOW (ref >60)
Glucose, Bld: 207 mg/dL — ABNORMAL HIGH (ref 70–99)
Glucose, Bld: 382 mg/dL — ABNORMAL HIGH (ref 70–99)
Phosphorus: 2.9 mg/dL (ref 2.5–4.6)
Phosphorus: 1.2 mg/dL — ABNORMAL LOW (ref 2.5–4.6)
Potassium: 3.8 mmol/L (ref 3.5–5.1)
Potassium: 3.2 mmol/L — ABNORMAL LOW (ref 3.5–5.1)
Sodium: 138 mmol/L (ref 135–145)
Sodium: 135 mmol/L (ref 135–145)

## 2023-07-26 LAB — CBC
HCT: 21.7 % — ABNORMAL LOW (ref 39.0–52.0)
HCT: 27.9 % — ABNORMAL LOW (ref 39.0–52.0)
Hemoglobin: 6.6 g/dL — CL (ref 13.0–17.0)
Hemoglobin: 8.6 g/dL — ABNORMAL LOW (ref 13.0–17.0)
MCH: 28.9 pg (ref 26.0–34.0)
MCH: 29 pg (ref 26.0–34.0)
MCHC: 30.4 g/dL (ref 30.0–36.0)
MCHC: 30.8 g/dL (ref 30.0–36.0)
MCV: 95.2 fL (ref 80.0–100.0)
MCV: 93.9 fL (ref 80.0–100.0)
Platelets: 82 10*3/uL — ABNORMAL LOW (ref 150–400)
Platelets: 107 10*3/uL — ABNORMAL LOW (ref 150–400)
RBC: 2.28 MIL/uL — ABNORMAL LOW (ref 4.22–5.81)
RBC: 2.97 MIL/uL — ABNORMAL LOW (ref 4.22–5.81)
RDW: 18.6 % — ABNORMAL HIGH (ref 11.5–15.5)
RDW: 18.6 % — ABNORMAL HIGH (ref 11.5–15.5)
WBC: 11.6 10*3/uL — ABNORMAL HIGH (ref 4.0–10.5)
WBC: 15 10*3/uL — ABNORMAL HIGH (ref 4.0–10.5)
nRBC: 0.3 % — ABNORMAL HIGH (ref 0.0–0.2)
nRBC: 0 % (ref 0.0–0.2)

## 2023-07-26 LAB — PREPARE RBC (CROSSMATCH)

## 2023-07-26 LAB — GLUCOSE, CAPILLARY
Glucose-Capillary: 101 mg/dL — ABNORMAL HIGH (ref 70–99)
Glucose-Capillary: 154 mg/dL — ABNORMAL HIGH (ref 70–99)
Glucose-Capillary: 156 mg/dL — ABNORMAL HIGH (ref 70–99)
Glucose-Capillary: 158 mg/dL — ABNORMAL HIGH (ref 70–99)
Glucose-Capillary: 167 mg/dL — ABNORMAL HIGH (ref 70–99)
Glucose-Capillary: 172 mg/dL — ABNORMAL HIGH (ref 70–99)
Glucose-Capillary: 194 mg/dL — ABNORMAL HIGH (ref 70–99)
Glucose-Capillary: 232 mg/dL — ABNORMAL HIGH (ref 70–99)
Glucose-Capillary: 280 mg/dL — ABNORMAL HIGH (ref 70–99)
Glucose-Capillary: 282 mg/dL — ABNORMAL HIGH (ref 70–99)
Glucose-Capillary: 380 mg/dL — ABNORMAL HIGH (ref 70–99)
Glucose-Capillary: 525 mg/dL (ref 70–99)
Glucose-Capillary: >600 mg/dL (ref 70–99)

## 2023-07-26 LAB — VANCOMYCIN, TROUGH: Vancomycin Tr: 51 ug/mL (ref 15–20)

## 2023-07-26 LAB — MAGNESIUM: Magnesium: 2.1 mg/dL (ref 1.7–2.4)

## 2023-07-26 MED ORDER — NOREPINEPHRINE 16 MG/250ML-% IV SOLN
0.0000 ug/min | INTRAVENOUS | Status: DC
Start: 2023-07-26 — End: 2023-08-01
  Administered 2023-07-26: 9 ug/min via INTRAVENOUS
  Administered 2023-07-27: 6 ug/min via INTRAVENOUS
  Administered 2023-07-29: 7 ug/min via INTRAVENOUS
  Administered 2023-07-31: 5.5 ug/min via INTRAVENOUS
  Filled 2023-07-26 (×5): qty 250

## 2023-07-26 MED ORDER — MIDODRINE HCL 5 MG PO TABS
10.0000 mg | ORAL_TABLET | Freq: Three times a day (TID) | ORAL | Status: DC
  Administered 2023-07-26 – 2023-07-27 (×4): 10 mg
  Filled 2023-07-26 (×4): qty 2

## 2023-07-26 MED ORDER — OLANZAPINE 10 MG IM SOLR
2.5000 mg | Freq: Once | INTRAMUSCULAR | Status: AC | PRN
  Administered 2023-07-26: 2.5 mg via INTRAMUSCULAR
  Filled 2023-07-26: qty 10

## 2023-07-26 MED ORDER — SODIUM CHLORIDE 0.9% IV SOLUTION: Freq: Once | INTRAVENOUS | Status: DC

## 2023-07-26 MED ORDER — QUETIAPINE FUMARATE 50 MG PO TABS
50.0000 mg | ORAL_TABLET | Freq: Two times a day (BID) | ORAL | Status: DC
  Administered 2023-07-26 (×2): 50 mg
  Filled 2023-07-26 (×2): qty 1

## 2023-07-26 MED ORDER — POTASSIUM CHLORIDE 20 MEQ PO PACK
40.0000 meq | PACK | Freq: Once | ORAL | Status: AC
  Administered 2023-07-26: 40 meq
  Filled 2023-07-26: qty 2

## 2023-07-26 MED ORDER — INSULIN DETEMIR 100 UNIT/ML ~~LOC~~ SOLN
8.0000 [IU] | Freq: Two times a day (BID) | SUBCUTANEOUS | Status: DC
  Filled 2023-07-26: qty 0.08

## 2023-07-26 MED ORDER — DEXTROSE 50 % IV SOLN
0.0000 mL | INTRAVENOUS | Status: DC | PRN
Start: 2023-07-26 — End: 2023-07-28

## 2023-07-26 MED ORDER — INSULIN REGULAR(HUMAN) IN NACL 100-0.9 UT/100ML-% IV SOLN
INTRAVENOUS | Status: DC
  Administered 2023-07-26: 14 [IU]/h via INTRAVENOUS
  Filled 2023-07-26 (×2): qty 100

## 2023-07-26 MED ORDER — SODIUM PHOSPHATES 45 MMOLE/15ML IV SOLN
30.0000 mmol | Freq: Once | INTRAVENOUS | Status: AC
  Administered 2023-07-26: 30 mmol via INTRAVENOUS
  Filled 2023-07-26: qty 10

## 2023-07-26 NOTE — Progress Notes (Addendum)
NAME:  [name], MRN:  [id number], DOB:  Jun 28, 1981, LOS: 16 ADMISSION DATE:  07/09/2023, CONSULTATION DATE:  07/10/2023 REFERRING MD:  Suezanne Jacquet, CHIEF COMPLAINT:  DKA, AMS   History of Present Illness:  Patient is a 42 yo M w/ pertinent PMH ESRD on HD MWF, IDDM, gastroparesis, left AKA, HTN presents to [facility] on 10/11 w/ AMS.   Patient recently admitted to [facility] on 7/12 sepsis from right diabetic foot ulcer. Cultures w/ MRSA. Patient had dialysis catheter removed and replaced. ID treated w/ vanc. Patient refused echo and left on 7/17 against medical advise.    On 10/11 patient was found by family member on ground altered. EMS arrived and dialysis catheter partially removed. Patient more alert for them. Has been complaining of diarrhea x1 week per family. Unsure if patient went to dialysis today. Transferred to [facility] ED. On arrival bp stable 107/70 and afebrile. Patient remains confused and occasionally with agitation. Given dilaudid. CBG 497. Beta H >8. VBG ph 7.06, 16, 54, 4.7. Given iv fluids and started on insulin drip for DKA. CT head negative for acute abnormality. Cxr no significant findings. CT abd/pelvis no acute findings; possible steatosis vs. Liver mass 7 x 4 cm recommending f/u w/ MRI. WBC 20.7 and LA 2.4. Sepsis protocol initiated. Cultures obtained and started on vanc/zosyn. Given ams, dka, and soft bp pccm consulted for icu admission.   10/21 pccm called overnight to assess for worsening hypoxia and AMS after vomiting. On NRB w/ sats 70-80s. Hypoglycemic given dextrose which improved his mental status some. Transferred to 2H icu for intubation.     Pertinent  Medical History  ESRD on HD, HTN, diabetes, diabetic gastroparesis, cellulitis of left foot, left AKA  Significant Hospital Events: Including procedures, antibiotic start and stop dates in addition to other pertinent events   10/12: altered mental status, DKA 10/12: blood cultures positive for MRSA 10/21: resp failure  requiring intubation 10/22- TEE 10/22 with EF 35%, LV global hypokinesis. Very large vegetation lateral scallop of posterior mitral leaflet 3.9cm length 1 cm width, highly mobile, prolapsing into LV  10/23: per nursing less responsive than yesterday, increased ETT secretions. Clotting off CRRT. Ct head showing expected evolution of the right MCA stroke.  10/24 agitated when sedation decreased, is purposeful but does not follow commands. Tolerating UF gaols of -150. Still on NE infusion  10/25 failed swallow eval still on just 3 lpm O2. Encephalopathic. Added seroquel in effort to get him off the precedex. Cardiology told no beds at [facility]. Not clear if accepted or not. Worsening pressor requirements. Got blood for hgb 6. Cefepime resumed for clinical decline and leukocytosis  10/26 hemoglobin again dropped to 6.3 overnight with increased pressor requirement yesterday afternoon now on vaso and levo. 10/27 hypoglycemic episode overnight, mental status improved a little over weekend  10/28 getting another unit of blood more awake.  Oriented x 2.  Still on Precedex.  Having liquid stools.  Interim History / Subjective:  Certainly more interactive than he was on Friday.  Coming down on vasoactive drip support.  Tells me his name and that he is in the hospital he recognizes his nurse  Objective   Blood pressure (!) 120/52, pulse 88, temperature 99.1 F (37.3 C), temperature source Axillary, resp. rate (!) 24, height 5\' 7"  (1.702 m), weight 67.6 kg, SpO2 100%.        Intake/Output Summary (Last 24 hours) at 07/26/2023 0749 Last data filed at 07/26/2023 0700 Gross per 24 hour  Intake 3862.76 ml  Output 4502.3 ml  Net -639.54 ml   Filed Weights   07/24/23 0500 07/25/23 0500 07/26/23 0500  Weight: 66.5 kg 68.9 kg 67.6 kg    Examination: General Chronically ill-appearing 42 year old male patient is currently lying in bed he is in no acute distress today HEENT normocephalic atraumatic mucous  membranes are moist Pulmonary clear to auscultation currently on room air saturations mid 90s no accessory use Cardiac: Regular rate and rhythm no murmur rub or gallop Abdomen: Soft nontender he is having liquid stools GU an uric Extremities warm dry.  Prior left AKA.  Dressing intact.  Dressing on right heel Neuro he is awake speech is soft.  Oriented x 1-2.  Certainly more interactive than he was.  He will intermittently follow commands.  Continues to have left-sided hemiparesis  Resolved Hospital Problem list   DKA  Septic shock   Assessment & Plan:  Acute hypoxic respiratory failure; due to aspiration PNA, evolving cavitary PNA and mucous plugging  -cxr has been stable.  -LUL cavitary lesion unchanged.  -Extubated 10/24 Plan Continue supplemental oxygen for sat goal greater than 92 Mobilize as able  Aspiration precautions  IV antibiotics as below   On-going hypotension w/ Septic shock due to Mitral valve endocarditis w/ MRSA bacteremia  I think the dex may also be contributing.  Did have clinical decline on 25th so added back GNR coverage. Since then his WBC ct has improved.  plan Remains on vancomycin with extended course  Day 4 of 7 cefepime  F/u pending BC from 25th Add midodrine  Dc vasopressin  Norepi on standby   Combined systolic/diastolic CHF Cardiogenic shock  Plan Continuous telemetry  Strict intake and output  Daily weight  Not candidate for GDMT as he is in shock  He was deferred by cardiothoracic surgery here, they recommended tertiary care consultation.  Cardiology spoke to [facility] re: There were no beds.  At this point with his ongoing delirium he is not a surgical candidate.  Will continue to try to optimize him medically.  His delirium needs to clear, then he can be considered for possible high risk mitral valve repair however hard to believe he is a surgical candidate  Acute metabolic encephalopathy w/ agitated delirium  Acute Right MCA  stroke/septic  infarct w/ left sided hemiparesis   Has been improving some over the weekend but still on Dex. plan Maintain neuro protective measures Nutrition and bowel regiment  Aspirations precautions  Supportive care  Delirium precautions  Increased seroquel to 50mg  bid Continue low dose Klonopin Wean Precedex as able, will cut dose ceiling in 1/2 PT/OT as able   Dysphagia plan NPO Continue Cortrak  SLP following    T2DM w/ hyperglycemia  Has intermittent episodes of hyperglycemia. plan Cont moderate SSI q 4 Changing basal coverage to BID at lower dosing   End-stage renal disease on hemodialysis (MWF at baseline) Has occluded bilateral [place] veins  plan CRRT as directed by nephro  Anemia/thrombocytopenia On-going issues w/ anemia. No obvious bleeding. Some of this may be due to hemolysis from  CRRT and freq filter clotting. He has received 3 units PRBC since 25th. His 4 unit is pending. PLTs had been falling. Looks like they may be improving.  Plan Complete transfusion Trigger for transfusion < 7 or as needed for symptomatic bleeding  He has been off [name] heparin since 25th as his appt has been supra therapeutic  Place SCDs  Incidental finding of Liver steatosis vs. Liver  mass -CT abd/pelvis showing 7 x 4 cm density in anterior liver  plan Follow up outpatient   Best Practice (right click and "Reselect all SmartList Selections" daily)   Diet/type: tubefeeds DVT prophylaxis: SCD GI prophylaxis: N/A Lines: Central line, Dialysis Catheter, Arterial Line, and yes and it is still needed Foley:  Yes, and it is still needed Code Status:  full code Last date of multidisciplinary goals of care discussion Patient and mom updated at bedside am 10/27   Critical care time:    Performed by: Shelby Mattocks  Total critical care time: 32 min

## 2023-07-26 NOTE — Progress Notes (Signed)
Events of day  BP needs up a little.  He is now OFF precedex. I will dc it from profile.  Mental status about the same. Still inconsistent w/ his response. Per family fully alert and cooperative over weekend. That has not been what I have observed over today He was seen by SLP. + dysphagia and penetration noted on BS eval. W/ recs to cont NPO for now  Plan Cont to wean pressors for SBP > 100 Cont midodrine  Cont abx If BP needs remain elevated can inc midodrine to 15 tomorrow.

## 2023-07-26 NOTE — TOC Progression Note (Signed)
Transition of Care [facility]) - Progression Note    Patient Details  Name: [name] MRN: [id number] Date of Birth: 04/29/1981  Transition of Care [facility]) CM/SW Contact  Ronny Bacon, RN Phone Number: 07/26/2023, 3:04 PM  Clinical Narrative:  Secure chat received from case manager regarding mom wanting to speak to CM or SW. Phone call to mom, reports needing FMLA paperwork filled out and returned by next week. She spoke with a Dr. Salena Saner and is under the impression he would fill them out for her. Mom to bring the paperwork in and leave at bedside or give to floor nurse to give to provider to fill out.          Expected Discharge Plan and Services                                               Social Determinants of Health (SDOH) Interventions SDOH Screenings   Food Insecurity: No Food Insecurity (04/14/2023)  Housing: Low Risk  (02/15/2023)  Transportation Needs: No Transportation Needs (04/14/2023)  Utilities: Not At Risk (04/14/2023)  Alcohol Screen: Low Risk  (02/15/2023)  Depression (PHQ2-9): Low Risk  (02/15/2023)  Financial Resource Strain: Low Risk  (02/15/2023)  Physical Activity: Inactive (02/15/2023)  Social Connections: Socially Integrated (02/15/2023)  Stress: No Stress Concern Present (02/15/2023)  Tobacco Use: Low Risk  (04/09/2023)    Readmission Risk Interventions     No data to display

## 2023-07-26 NOTE — Progress Notes (Signed)
[facility] NEPHROLOGY PROGRESS NOTE  Subjective:  Seen and examined in ICU. Clotting resolved w/ citrate protocol.  Net I/O yesterday: even Pressors: vaso  Discussed w/ ICU RN  Objective Vital signs in last 24 hours: Vitals:   07/26/23 1000 07/26/23 1015 07/26/23 1030 07/26/23 1045  BP:      Pulse: 99 100 97 100  Resp: (!) 24 (!) 25 (!) 21 (!) 24  Temp:      TempSrc:      SpO2: 100% 98% 99% 99%  Weight:      Height:       Physical Exam: General: ill appearing Heart:RRR, +systolic murmur Lungs: b/l chest expansion, normal wob Abdomen:soft, Non-tender, non-distended Extremities:No edema, left aka Neuro: awake, following some commands Dialysis Access: left femoral TDC  OP HD: MWF [facility] 4h  400/1.5    77kg   2/3 bath  [facility] TDC    Heparin none - last OP HD 10/7, post wt 74.6kg - coming off 75- 76kg - misses HD once every other week approx - rocaltrol 1.50 mcg  - no esa  Assessment/ Plan: MRSA bacteremia with MV endocarditis -s/p line holiday, TDC placed by IR 10/18 -on vanc and cefepime -per primary service and CTS-high risk for surgery, may need transfer to higher level of care to consider surgery but will need to be extubated first which he is  Acute ischemic CVA/septic infarct 10/17 -neuro signed off, per primary service  AHRF -possibly secondary to aspiration. Intubated 10/21, extubated 10/24 -per PCCM  Shock -pressor support per primary serivce  ESRD -typically on HD MWF -given ongoing shock, started CRRT 10/22 -UFG: is down 10kg and 10kg under dry wt, will change to keep +50 cc/hr -pre and post: 2k; D: 4k -A/C: clotting excessively on max IV heparin thru circuit. Changed to citrate protocol 10/27, much better. Discussed w/ RN -cont CRRT  Dialysis access - Per IR note, occluded both right and left subclavian on venography therefore successful placement of left femoral TDC with the tip of the catheter in the infrarenal IVC.  BP/ vol - as  above, may be dry, keep + 50 cc/hr   Anemia of ESRD-  Avoid IV iron with endocarditis. Transfuse PRN for hgb <7. On ESA -questionable hemolysis from endocarditis? Will defer to primary service for workup of anemia  CKD MBD-  calcitriol per tube. Resume sensipar when taking PO but will see how is calcium is doing first. Replete PO4 IV PRN, holding binders given hypophos.   Thrombocytopenia -w/u per primary. Secondary to HIT vs sepsis? IV heparin thru circuit was dc'd and citrate protocol started 10/27.   Vinson Moselle  MD  [facility] 07/26/2023, 11:12 AM  Recent Labs  Lab 07/25/23 1637 07/25/23 1639 07/26/23 0355 07/26/23 0401 07/26/23 0825 07/26/23 0827  HGB  --    < > 6.6*   < > 10.2* 8.8*  ALBUMIN 1.9*  --  1.9*  --   --   --   CALCIUM 9.1  --  9.1  --   --   --   PHOS 1.6*  --  2.9  --   --   --   CREATININE 1.85*   < > 1.76*   < > 1.20 1.40*  K 3.3*   < > 3.8   < > 3.6 4.1   < > = values in this interval not displayed.    Inpatient medications:  sodium chloride   Intravenous Once   calcitRIOL  1.5 mcg Per  Tube Q M,W,F   Chlorhexidine Gluconate Cloth  6 each Topical Q0600   clonazePAM  0.5 mg Oral BID   darbepoetin (ARANESP) injection - DIALYSIS  40 mcg Subcutaneous Q Fri-1800   feeding supplement (PROSource TF20)  60 mL Per Tube QID   fiber supplement (BANATROL TF)  60 mL Per Tube BID   gabapentin  100 mg Per Tube Q8H   insulin aspart  0-15 Units Subcutaneous Q4H   insulin detemir  8 Units Subcutaneous BID   midodrine  10 mg Per Tube Q8H   multivitamin  1 tablet Per Tube BID   mouth rinse  15 mL Mouth Rinse 4 times per day   QUEtiapine  50 mg Per Tube BID     prismasol BGK 4/2.5      prismasol BGK 4/2.5 400 mL/hr at 07/26/23 0603   calcium gluconate 20 g in dextrose 5 % 1,000 mL infusion 60 mL/hr at 07/26/23 1000   ceFEPime (MAXIPIME) IV Stopped (07/26/23 0835)   citrate dextrose 360 mL/hr at 07/26/23 0559   dexmedetomidine (PRECEDEX) IV infusion 0.4 mcg/kg/hr (07/26/23  1000)   feeding supplement (OSMOLITE 1.5 CAL) 55 mL/hr at 07/26/23 1000   heparin 10,000 units/ 20 mL infusion syringe Stopped (07/25/23 0906)   norepinephrine (LEVOPHED) 16 mg in dextrose 5 % 250 mL (0.064 mg/mL) infusion Stopped (07/26/23 0203)   prismasol BGK 4/2.5 1,500 mL/hr at 07/26/23 0543   vancomycin 150 mL/hr at 07/26/23 1000   acetaminophen, acetaminophen, albuterol, dextrose, docusate, Gerhardt's butt cream, guaiFENesin, heparin, mouth rinse, promethazine

## 2023-07-26 NOTE — Progress Notes (Signed)
Pressor requirements increasing  -got 1 unit blood earlier today  Plan Will repeat CBC now Cont to titrate NE as needed May need to repeat transfusion if did not have expected bump in H&H as expected post-xfusion

## 2023-07-26 NOTE — Inpatient Diabetes Management (Signed)
Inpatient Diabetes Program Recommendations  AACE/ADA: New Consensus Statement on Inpatient Glycemic Control (2015)  Target Ranges:  Prepandial:   less than 140 mg/dL      Peak postprandial:   less than 180 mg/dL (1-2 hours)      Critically ill patients:  140 - 180 mg/dL   Lab Results  Component Value Date   GLUCAP 280 (H) 07/26/2023   HGBA1C 8.9 (H) 07/10/2023    Review of Glycemic Control  Latest Reference Range & Units 07/25/23 22:15 07/25/23 23:50 07/26/23 00:32 07/26/23 04:02 07/26/23 08:24  Glucose-Capillary 70 - 99 mg/dL 161 (H) 71 096 (H) 045 (H) 280 (H)  (H): Data is abnormally high Diabetes history: Type 1 DM  Outpatient Diabetes medications:  Novolin R 12 units TID, Novolin 70/30 10 units BID  Current orders for Inpatient glycemic control:  Levemir 8 units BID Novolog 0-15 units q 4 hours Vital 1.5 cal- 55 ml/hr   Inpatient Diabetes Program Recommendations:    Consider: -Decreasing Levemir to 6 units BID -Decreasing Novolog 0-6 units Q4H -adding Novolog 2 units q 4 hours for tube feed coverage.   Thanks, Lujean Rave, MSN, RNC-OB Diabetes Coordinator [phone] (8a-5p)

## 2023-07-26 NOTE — Progress Notes (Signed)
Speech Language Pathology Treatment: Dysphagia  Patient Details Name: SALEEM FUESS MRN: [id number] DOB: 11-07-1980 Today's Date: 07/26/2023 Time: 2952-8413 SLP Time Calculation (min) (ACUTE ONLY): 16 min  Assessment / Plan / Recommendation Clinical Impression  Mr. Choat was more alert and participatory today, mouthing words and following some simple commands. He was aphonic despite cues to generate voice. His cousin, Candice, was at the bedside.  He demonstrated improved oral attention with increased effort to masticate ice. He demonstrated oral holding and retention in left cheek, weak side.  Palpation of larynx revealed consistent delay in movement of those muscles.  Teaspoons of thin water led to consistent wet cough response, concerning for aspiration. Dysphagia persists. Recommend continuing NPO for now except allowing occasional ice chips throughout the day.  SLP will return for ongoing reassessments at bedside.  D/W RN.   HPI HPI: Pt is a 42 yo male presenting 10/11 with AMS. His dialysis catheter was partially removed and he was in DKA. Admitted for septic shock from MRSA bacteremia; infective endocarditis. LUE weakness was noted 10/17 and MRI confirmed sizable infarct in the R MCA branch as well as small acute infarcts in the L occipital lobe and bilateral cerebellum. Pt initially evaluated by SLP 10/18 and recommended to have Dys 3 diet and thin liquids due primarily oral symptoms. Pt had worsening hypoxia and AMS after vomiting on 10/21 requiring intubation 10/21-10/24. 10/22 CRRT. PMH includes: ESRD, DM, gastroparesis, L AKA, HTN, recent admission in July for sepsis secondary to R diabetic foot ulcer (left AMA)      SLP Plan  Continue with current plan of care      Recommendations for follow up therapy are one component of a multi-disciplinary discharge planning process, led by the attending physician.  Recommendations may be updated based on patient status, additional functional  criteria and insurance authorization.    Recommendations  Diet recommendations: NPO Medication Administration: Via alternative means                  Oral care QID   Frequent or constant Supervision/Assistance Dysphagia, unspecified (R13.10)     Continue with current plan of care    Athan Casalino L. Samson Frederic, MA CCC/SLP Clinical Specialist - Acute Care SLP Acute Rehabilitation Services Office number [phone]  [name]  07/26/2023, 3:39 PM

## 2023-07-26 NOTE — Progress Notes (Signed)
eLink Physician-Brief Progress Note Patient Name: [name] DOB: 09-12-1981 MRN: [id number]   Date of Service  07/26/2023  HPI/Events of Note  CBG up and down via down, current CBG 71, BSRN requesting to discontinue scheduled Insulin for TF coverage every 4 hrs  ESRD  eICU Interventions  Hold scheduled insulin coverage     Intervention Category Intermediate Interventions: Other:  Darl Pikes 07/26/2023, 12:37 AM

## 2023-07-26 NOTE — Progress Notes (Signed)
eLink Physician-Brief Progress Note Patient Name: [name] DOB: [birth year] MRN: [id number]   Date of Service  07/26/2023  HPI/Events of Note  Critical lab value: Hemoglobin 6.6 No active bleeding  Seen ongoing renal replacement therapy  eICU Interventions  Ordered to transfuse 1 unit PRBC     Intervention Category Intermediate Interventions: Other:  Darl Pikes 07/26/2023, 4:55 AM

## 2023-07-26 NOTE — Progress Notes (Signed)
   Patient Name: JABRILL PAYANT Date of Encounter: 07/26/2023 [facility] Cardiologist: None   Interval Summary  .    Laying in bed, getting cleaned At times will talk and remember certain items. Vital Signs .    Vitals:   07/26/23 0615 07/26/23 0630 07/26/23 0645 07/26/23 0700  BP:      Pulse: 90 88 95 88  Resp: (!) 27 (!) 23 (!) 24 (!) 24  Temp:      TempSrc:      SpO2: 98% 99% 99% 100%  Weight:      Height:        Intake/Output Summary (Last 24 hours) at 07/26/2023 0958 Last data filed at 07/26/2023 0700 Gross per 24 hour  Intake 3620.79 ml  Output 4469.8 ml  Net -849.01 ml      07/26/2023    5:00 AM 07/25/2023    5:00 AM 07/24/2023    5:00 AM  Last 3 Weights  Weight (lbs) 149 lb 0.5 oz 151 lb 14.4 oz 146 lb 9.7 oz  Weight (kg) 67.6 kg 68.9 kg 66.5 kg      Telemetry/ECG    No adverse arrhythmias- Personally Reviewed  Physical Exam .   GEN: Intermittent delirium laying in bed getting cleaned Neck: No JVD Cardiac: RRR, no murmurs, rubs, or gallops.  Extubated GI: non-distended  MS: AKA  Assessment & Plan .     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  Our surgical team here has evaluated him.  Ultimately given his several comorbidities he would be extremely high risk for further morbidity/mortality.  Of course, continue further heart team discussion as well as discussion with critical care team pending  clinical changes. -Supportive care, low-dose norepinephrine, midodrine, IV cefepime  CRITICAL CARE Performed by: Donato Schultz   Total critical care time: 40 minutes  Critical care time was exclusive of separately billable procedures and treating other patients.  Critical care was necessary to treat or prevent imminent or life-threatening deterioration.  Critical care was time spent personally by me on the following activities: development of treatment plan with patient and/or surrogate as well as nursing, discussions with consultants, evaluation of patient's response to treatment, examination of patient, obtaining history from patient or surrogate, ordering and performing treatments and interventions, ordering and review of laboratory studies, ordering and review of radiographic studies, pulse oximetry and re-evaluation of patient's condition.  For questions or updates, please contact [facility] Please consult [email or web address] for contact info under        Signed, Donato Schultz, MD

## 2023-07-27 DIAGNOSIS — E872 Acidosis, unspecified: Secondary | ICD-10-CM | POA: Diagnosis present

## 2023-07-27 DIAGNOSIS — I059 Rheumatic mitral valve disease, unspecified: Secondary | ICD-10-CM | POA: Diagnosis not present

## 2023-07-27 DIAGNOSIS — K760 Fatty (change of) liver, not elsewhere classified: Secondary | ICD-10-CM | POA: Insufficient documentation

## 2023-07-27 DIAGNOSIS — I079 Rheumatic tricuspid valve disease, unspecified: Secondary | ICD-10-CM | POA: Insufficient documentation

## 2023-07-27 DIAGNOSIS — I69354 Hemiplegia and hemiparesis following cerebral infarction affecting left non-dominant side: Secondary | ICD-10-CM

## 2023-07-27 DIAGNOSIS — I69391 Dysphagia following cerebral infarction: Secondary | ICD-10-CM

## 2023-07-27 DIAGNOSIS — Z89612 Acquired absence of left leg above knee: Secondary | ICD-10-CM

## 2023-07-27 DIAGNOSIS — R160 Hepatomegaly, not elsewhere classified: Secondary | ICD-10-CM | POA: Insufficient documentation

## 2023-07-27 DIAGNOSIS — I63311 Cerebral infarction due to thrombosis of right middle cerebral artery: Secondary | ICD-10-CM | POA: Diagnosis not present

## 2023-07-27 DIAGNOSIS — R570 Cardiogenic shock: Secondary | ICD-10-CM | POA: Insufficient documentation

## 2023-07-27 DIAGNOSIS — I82B13 Acute embolism and thrombosis of subclavian vein, bilateral: Secondary | ICD-10-CM | POA: Insufficient documentation

## 2023-07-27 DIAGNOSIS — J9601 Acute respiratory failure with hypoxia: Secondary | ICD-10-CM | POA: Diagnosis not present

## 2023-07-27 DIAGNOSIS — I5021 Acute systolic (congestive) heart failure: Secondary | ICD-10-CM | POA: Diagnosis not present

## 2023-07-27 DIAGNOSIS — R5381 Other malaise: Secondary | ICD-10-CM | POA: Insufficient documentation

## 2023-07-27 DIAGNOSIS — J189 Pneumonia, unspecified organism: Secondary | ICD-10-CM

## 2023-07-27 DIAGNOSIS — J690 Pneumonitis due to inhalation of food and vomit: Secondary | ICD-10-CM

## 2023-07-27 DIAGNOSIS — R7881 Bacteremia: Secondary | ICD-10-CM | POA: Diagnosis not present

## 2023-07-27 DIAGNOSIS — D62 Acute posthemorrhagic anemia: Secondary | ICD-10-CM | POA: Insufficient documentation

## 2023-07-27 DIAGNOSIS — B9562 Methicillin resistant Staphylococcus aureus infection as the cause of diseases classified elsewhere: Secondary | ICD-10-CM | POA: Diagnosis not present

## 2023-07-27 LAB — POCT I-STAT, CHEM 8
BUN: 25 mg/dL — ABNORMAL HIGH (ref 6–20)
BUN: 28 mg/dL — ABNORMAL HIGH (ref 6–20)
BUN: 23 mg/dL — ABNORMAL HIGH (ref 6–20)
BUN: 27 mg/dL — ABNORMAL HIGH (ref 6–20)
Calcium, Ion: 0.38 mmol/L — CL (ref 1.15–1.40)
Calcium, Ion: 1.04 mmol/L — ABNORMAL LOW (ref 1.15–1.40)
Calcium, Ion: 0.38 mmol/L — CL (ref 1.15–1.40)
Calcium, Ion: 1.08 mmol/L — ABNORMAL LOW (ref 1.15–1.40)
Chloride: 89 mmol/L — ABNORMAL LOW (ref 98–111)
Chloride: 91 mmol/L — ABNORMAL LOW (ref 98–111)
Chloride: 87 mmol/L — ABNORMAL LOW (ref 98–111)
Chloride: 90 mmol/L — ABNORMAL LOW (ref 98–111)
Creatinine, Ser: 1.2 mg/dL (ref 0.61–1.24)
Creatinine, Ser: 1.5 mg/dL — ABNORMAL HIGH (ref 0.61–1.24)
Creatinine, Ser: 1.1 mg/dL (ref 0.61–1.24)
Creatinine, Ser: 1.5 mg/dL — ABNORMAL HIGH (ref 0.61–1.24)
Glucose, Bld: 175 mg/dL — ABNORMAL HIGH (ref 70–99)
Glucose, Bld: 212 mg/dL — ABNORMAL HIGH (ref 70–99)
Glucose, Bld: 218 mg/dL — ABNORMAL HIGH (ref 70–99)
Glucose, Bld: 155 mg/dL — ABNORMAL HIGH (ref 70–99)
HCT: 28 % — ABNORMAL LOW (ref 39.0–52.0)
HCT: 32 % — ABNORMAL LOW (ref 39.0–52.0)
HCT: 33 % — ABNORMAL LOW (ref 39.0–52.0)
HCT: 28 % — ABNORMAL LOW (ref 39.0–52.0)
Hemoglobin: 10.9 g/dL — ABNORMAL LOW (ref 13.0–17.0)
Hemoglobin: 9.5 g/dL — ABNORMAL LOW (ref 13.0–17.0)
Hemoglobin: 11.2 g/dL — ABNORMAL LOW (ref 13.0–17.0)
Hemoglobin: 9.5 g/dL — ABNORMAL LOW (ref 13.0–17.0)
Potassium: 3.9 mmol/L (ref 3.5–5.1)
Potassium: 4 mmol/L (ref 3.5–5.1)
Potassium: 3.9 mmol/L (ref 3.5–5.1)
Potassium: 3.9 mmol/L (ref 3.5–5.1)
Sodium: 139 mmol/L (ref 135–145)
Sodium: 140 mmol/L (ref 135–145)
Sodium: 139 mmol/L (ref 135–145)
Sodium: 138 mmol/L (ref 135–145)
TCO2: 32 mmol/L (ref 22–32)
TCO2: 32 mmol/L (ref 22–32)
TCO2: 32 mmol/L (ref 22–32)
TCO2: 38 mmol/L — ABNORMAL HIGH (ref 22–32)

## 2023-07-27 LAB — TYPE AND SCREEN
ABO/RH(D): B POS
Antibody Screen: NEGATIVE
Unit division: 0
Unit division: 0

## 2023-07-27 LAB — GLUCOSE, CAPILLARY
Glucose-Capillary: 144 mg/dL — ABNORMAL HIGH (ref 70–99)
Glucose-Capillary: 149 mg/dL — ABNORMAL HIGH (ref 70–99)
Glucose-Capillary: 149 mg/dL — ABNORMAL HIGH (ref 70–99)
Glucose-Capillary: 151 mg/dL — ABNORMAL HIGH (ref 70–99)
Glucose-Capillary: 152 mg/dL — ABNORMAL HIGH (ref 70–99)
Glucose-Capillary: 155 mg/dL — ABNORMAL HIGH (ref 70–99)
Glucose-Capillary: 156 mg/dL — ABNORMAL HIGH (ref 70–99)
Glucose-Capillary: 157 mg/dL — ABNORMAL HIGH (ref 70–99)
Glucose-Capillary: 158 mg/dL — ABNORMAL HIGH (ref 70–99)
Glucose-Capillary: 159 mg/dL — ABNORMAL HIGH (ref 70–99)
Glucose-Capillary: 164 mg/dL — ABNORMAL HIGH (ref 70–99)
Glucose-Capillary: 164 mg/dL — ABNORMAL HIGH (ref 70–99)
Glucose-Capillary: 165 mg/dL — ABNORMAL HIGH (ref 70–99)
Glucose-Capillary: 175 mg/dL — ABNORMAL HIGH (ref 70–99)
Glucose-Capillary: 183 mg/dL — ABNORMAL HIGH (ref 70–99)
Glucose-Capillary: 188 mg/dL — ABNORMAL HIGH (ref 70–99)
Glucose-Capillary: 188 mg/dL — ABNORMAL HIGH (ref 70–99)
Glucose-Capillary: 189 mg/dL — ABNORMAL HIGH (ref 70–99)
Glucose-Capillary: 189 mg/dL — ABNORMAL HIGH (ref 70–99)
Glucose-Capillary: 191 mg/dL — ABNORMAL HIGH (ref 70–99)

## 2023-07-27 LAB — RENAL FUNCTION PANEL
Albumin: 2 g/dL — ABNORMAL LOW (ref 3.5–5.0)
Albumin: 1.9 g/dL — ABNORMAL LOW (ref 3.5–5.0)
Anion gap: 14 (ref 5–15)
Anion gap: 10 (ref 5–15)
BUN: 28 mg/dL — ABNORMAL HIGH (ref 6–20)
BUN: 26 mg/dL — ABNORMAL HIGH (ref 6–20)
CO2: 33 mmol/L — ABNORMAL HIGH (ref 22–32)
CO2: 38 mmol/L — ABNORMAL HIGH (ref 22–32)
Calcium: 9.8 mg/dL (ref 8.9–10.3)
Calcium: 9.7 mg/dL (ref 8.9–10.3)
Chloride: 92 mmol/L — ABNORMAL LOW (ref 98–111)
Chloride: 90 mmol/L — ABNORMAL LOW (ref 98–111)
Creatinine, Ser: 1.71 mg/dL — ABNORMAL HIGH (ref 0.61–1.24)
Creatinine, Ser: 1.66 mg/dL — ABNORMAL HIGH (ref 0.61–1.24)
GFR, Estimated: 50 mL/min — ABNORMAL LOW (ref >60)
GFR, Estimated: 52 mL/min — ABNORMAL LOW (ref >60)
Glucose, Bld: 182 mg/dL — ABNORMAL HIGH (ref 70–99)
Glucose, Bld: 149 mg/dL — ABNORMAL HIGH (ref 70–99)
Phosphorus: 2.6 mg/dL (ref 2.5–4.6)
Phosphorus: 2 mg/dL — ABNORMAL LOW (ref 2.5–4.6)
Potassium: 4 mmol/L (ref 3.5–5.1)
Potassium: 3.8 mmol/L (ref 3.5–5.1)
Sodium: 139 mmol/L (ref 135–145)
Sodium: 138 mmol/L (ref 135–145)

## 2023-07-27 LAB — BPAM RBC
Blood Product Expiration Date: 202411182359
Blood Product Expiration Date: 202411192359
ISSUE DATE / TIME: 202410261525
ISSUE DATE / TIME: 202410280519
Unit Type and Rh: 7300
Unit Type and Rh: 7300

## 2023-07-27 LAB — CALCIUM, IONIZED: Calcium, Ionized, Serum: 4.7 mg/dL (ref 4.5–5.6)

## 2023-07-27 LAB — VANCOMYCIN, RANDOM: Vancomycin Rm: 17 ug/mL

## 2023-07-27 LAB — APTT: aPTT: 31 s (ref 24–36)

## 2023-07-27 LAB — MAGNESIUM: Magnesium: 2 mg/dL (ref 1.7–2.4)

## 2023-07-27 MED ORDER — DEXMEDETOMIDINE HCL IN NACL 400 MCG/100ML IV SOLN
0.0000 ug/kg/h | INTRAVENOUS | Status: DC
  Administered 2023-07-27: 0.4 ug/kg/h via INTRAVENOUS
  Filled 2023-07-27: qty 100

## 2023-07-27 MED ORDER — SODIUM CHLORIDE 0.9 % IV SOLN: INTRAVENOUS | Status: DC

## 2023-07-27 MED ORDER — VANCOMYCIN HCL 500 MG/100ML IV SOLN
500.0000 mg | Freq: Once | INTRAVENOUS | Status: AC
  Administered 2023-07-27: 500 mg via INTRAVENOUS
  Filled 2023-07-27: qty 100

## 2023-07-27 MED ORDER — VANCOMYCIN HCL 750 MG/150ML IV SOLN
750.0000 mg | INTRAVENOUS | Status: DC
  Administered 2023-07-28: 750 mg via INTRAVENOUS
  Filled 2023-07-27 (×2): qty 150

## 2023-07-27 MED ORDER — OXYCODONE HCL 5 MG/5ML PO SOLN
7.5000 mg | ORAL | Status: DC | PRN
  Administered 2023-07-27 – 2023-08-02 (×10): 7.5 mg
  Filled 2023-07-27 (×12): qty 10

## 2023-07-27 MED ORDER — MIDODRINE HCL 5 MG PO TABS
15.0000 mg | ORAL_TABLET | Freq: Three times a day (TID) | ORAL | Status: DC
  Administered 2023-07-27 – 2023-08-02 (×18): 15 mg
  Filled 2023-07-27 (×19): qty 3

## 2023-07-27 MED ORDER — LACTATED RINGERS IV BOLUS
1000.0000 mL | Freq: Once | INTRAVENOUS | Status: AC
  Administered 2023-07-27: 1000 mL via INTRAVENOUS

## 2023-07-27 MED ORDER — SODIUM CHLORIDE 0.9 % IV SOLN
4.0000 g | Freq: Once | INTRAVENOUS | Status: DC
  Filled 2023-07-27: qty 40

## 2023-07-27 MED ORDER — QUETIAPINE FUMARATE 100 MG PO TABS
100.0000 mg | ORAL_TABLET | Freq: Two times a day (BID) | ORAL | Status: DC
  Administered 2023-07-27 – 2023-07-28 (×4): 100 mg
  Filled 2023-07-27 (×4): qty 1

## 2023-07-27 NOTE — Progress Notes (Signed)
OT Cancellation Note  Patient Details Name: [name] MRN: [id number] DOB: November 13, 1980   Cancelled Treatment:    Reason Eval/Treat Not Completed: Medical issues which prohibited therapy. Checked with RN this morning and pt following some commands stating okay to be seen, however, Pt with progressive drop in BP and tachy when checking back with RN. Will return as pt medically appropriate and schedule allows.   Tyler Deis, OTR/L [facility] Acute Rehabilitation Office: [phone]   [name] 07/27/2023, 11:04 AM

## 2023-07-27 NOTE — Progress Notes (Signed)
I spoke to [facility] center  Judea has been Declined because [facility] is currently at capacity. His case was discussed w/ the [facility] medical director for the transfer center.  Plan We will call them again in 48hrs to re-assess bed situation and candidacy for transfer  Family updated.   Simonne Martinet ACNP-BC [facility] Pulmonary/Critical Care Pager # [phone] OR # [phone] if no answer

## 2023-07-27 NOTE — Progress Notes (Signed)
PT Cancellation Note  Patient Details Name: [name] MRN: [id number] DOB: 1981-01-31   Cancelled Treatment:    Reason Eval/Treat Not Completed: Medical issues which prohibited therapy. Pt with progressive drop in BP and tachycardia and not appropriate for PT at this time. Acute PT to return as able, as appropriate to progress mobility.  Lewis Shock, PT, DPT Acute Rehabilitation Services Secure chat preferred Office #: [phone]    Iona Hansen 07/27/2023, 10:12 AM

## 2023-07-27 NOTE — Progress Notes (Addendum)
NAME:  [name], MRN:  [id number], DOB:  01/28/1981, LOS: 17 ADMISSION DATE:  07/09/2023, CONSULTATION DATE:  07/10/2023 REFERRING MD:  Suezanne Jacquet, CHIEF COMPLAINT:  DKA, AMS   History of Present Illness:  Patient is a 42 yo M w/ pertinent PMH ESRD on HD MWF, IDDM, gastroparesis, left AKA, HTN presents to [facility] on 10/11 w/ AMS.   Patient recently admitted to [facility] on 7/12 sepsis from right diabetic foot ulcer. Cultures w/ MRSA. Patient had dialysis catheter removed and replaced. ID treated w/ vanc. Patient refused echo and left on 7/17 against medical advise.    On 10/11 patient was found by family member on ground altered. EMS arrived and dialysis catheter partially removed. Patient more alert for them. Has been complaining of diarrhea x1 week per family. Unsure if patient went to dialysis today. Transferred to [facility] ED. On arrival bp stable 107/70 and afebrile. Patient remains confused and occasionally with agitation. Given dilaudid. CBG 497. Beta H >8. VBG ph 7.06, 16, 54, 4.7. Given iv fluids and started on insulin drip for DKA. CT head negative for acute abnormality. Cxr no significant findings. CT abd/pelvis no acute findings; possible steatosis vs. Liver mass 7 x 4 cm recommending f/u w/ MRI. WBC 20.7 and LA 2.4. Sepsis protocol initiated. Cultures obtained and started on vanc/zosyn. Given ams, dka, and soft bp pccm consulted for icu admission.   10/21 pccm called overnight to assess for worsening hypoxia and AMS after vomiting. On NRB w/ sats 70-80s. Hypoglycemic given dextrose which improved his mental status some. Transferred to 2H icu for intubation.     Pertinent  Medical History  ESRD on HD, HTN, diabetes, diabetic gastroparesis, cellulitis of left foot, left AKA  Significant Hospital Events: Including procedures, antibiotic start and stop dates in addition to other pertinent events   10/12: altered mental status, DKA 10/12: blood cultures positive for MRSA 10/13 TTE LVEF 25-30% w/  severely depressed LVF, global hypokinesis, gd I diastolic dysfxn, mod RV fxn reduction. Possible filamentous structure on MV 10/16 TEE LVEF 30-35%RV fxn nml. Large mass (2.00 cm x 0.88 cm ) on the posterior mitral leaflet which appeared to be extending in the mitral annulus. There are two mobile masses visible on the anterior leaflet of the tricupid valve which also appears to have a perforation. Another mobile  mass ( 0.68 cm x 0.67 cm) on the posterior tricupid leaflet. Moderate Tricuspid regurgitation which appears to  be through the leaflet perforation  10/16 seen by Cardiothoracic surg. Felt poor candidate. Recommended 6 weeks abx and recommended repeat TEE 1 week 10/17 noting left arm weakness. Not clear when started.  MRI showed Right MCA infarct w/ acute infarcts in left occipital and bilateral cerebellum felt c/w septic emboli. Neuro consulted.  10/18 tunneled left femoral vasc cath placed after catheter holiday  10/20 palliative care consulted later that day worsening hypoxia. Sats 70s. After vomiting. More lethargic  10/21: resp failure requiring intubation shortly after midnight  10/22- TEE 10/22 with EF 35%, LV global hypokinesis. Very large vegetation lateral scallop of posterior mitral leaflet 3.9cm length 1 cm width, highly mobile, prolapsing into LV. No large vegetation seen on TV.  Later that afternoon and abrupt hemodynamic changes requiring escalation of vasopressor support.  Felt given the size of his vegetation and increased bulk of the vegetation infections unlikely to clear.  He was felt to be decompensation may have due to of breath leaflet perforation and acute mitral valve regurgb 10/23: per nursing less  responsive than yesterday, increased ETT secretions. Clotting off CRRT. Ct head showing expected evolution of the right MCA stroke.  Cardiothoracic surgery re-consulted for worsening MVE. Felt too high risk here. Recommended referral to tertiary care  10/24 agitated when  sedation decreased, is purposeful but does not follow commands. Tolerating UF gaols of -150. Still on NE infusion but extubated later that afternoon after passing SBT 10/25 failed swallow eval still on just 3 lpm O2. Encephalopathic. Added seroquel in effort to get him off the precedex. Cardiology told no beds at [facility]. Not clear if accepted or not. Worsening pressor requirements. Got blood for hgb 6. Cefepime resumed for clinical decline and leukocytosis  10/26 hemoglobin again dropped to 6.3 overnight with increased pressor requirement yesterday afternoon now on vaso and levo. 10/27 hypoglycemic episode overnight, mental status improved a little over weekend  10/28 getting another unit of blood more awake.  Oriented x 2.  Still on Precedex.  Having liquid stools.  Interim History / Subjective:  Certainly more interactive than he was on Friday.  Coming down on vasoactive drip support.  Tells me his name and that he is in the hospital he recognizes his nurse  Objective   Blood pressure (!) 120/52, pulse (!) 129, temperature (!) 100.9 F (38.3 C), temperature source Axillary, resp. rate (!) 28, height 5\' 7"  (1.702 m), weight 65.9 kg, SpO2 100%.        Intake/Output Summary (Last 24 hours) at 07/27/2023 0732 Last data filed at 07/27/2023 0700 Gross per 24 hour  Intake 4345.37 ml  Output 4822.6 ml  Net -477.23 ml   Filed Weights   07/25/23 0500 07/26/23 0500 07/27/23 0500  Weight: 68.9 kg 67.6 kg 65.9 kg    Examination: General Chronically ill-appearing 42 year old male patient.  He is restless fidgeting, but not acute distress HEENT normocephalic atraumatic has left facial droop Pulmonary diminished bases no accessory currently well-appearing Cardiac tachycardic rhythm Sinus tachycardia no MRG Extremities are warm, post left AKA is HD catheter is in the left femoral site, has a triple-lumen.  Arterial catheter in the right femoral site  Abdomen soft still having stool failed bedside  swallow eval yesterday  GU anuric  Neuro he is awake his speech is soft, but he is oriented x 3 able to tell me hospital year, and his name general intermittently restless he is inattentive.  On extended conversation will follow commands continues to have left-sided hemiparesis   Resolved Hospital Problem list   DKA  Septic shock   Assessment & Plan:  Acute hypoxic respiratory failure; due to aspiration PNA, evolving cavitary PNA and mucous plugging  -cxr has been stable.  -LUL cavitary lesion unchanged.  Suspect this is embolic from tricuspid valve involvement -Extubated 10/24 Plan Continue supplemental oxygen for sat goal greater than 92 Mobilize as able  Aspiration precautions  IV antibiotics as below  Need to mobilize   On-going hypotension w/ Septic shock due to Mitral and tricuspid valve endocarditis w/ MRSA bacteremia  Not clear to what extent renal related vasoplegia and valve component contributing  Did have clinical decline on 25th so added back GNR coverage. Since then his WBC ct has improved.  plan Remains on vancomycin with extended course  Day 5 of 7 cefepime  Keeping euvolemic F/u pending BC from 25th Added midodrine, inc to 15 tid  Titrate NE for SBP > 90   Combined systolic/diastolic CHF Cardiogenic shock  Plan Continuous telemetry  Strict intake and output  Daily weight  Not  candidate for GDMT as he is in shock  He was deferred by cardiothoracic surgery here, they recommended tertiary care consultation.  Cardiology spoke to [facility] re: There were no beds.    Acute metabolic encephalopathy w/ agitated delirium  Acute Right MCA  stroke/septic infarct w/ left sided hemiparesis   Improved over weekend to point he was having conversation w/ family and watching programs on ipad. His exam however has not been consistent. Slow to respond to medical staff. At times seems to have expressive aphasia  plan Maintain neuro protective measures  Aspirations precautions   Supportive care  Delirium precautions  Precedex was off as of 10/29, resumed again early this am.  Now off once again.  I will increase his Seroquel  Dysphagia plan NPO Continue Cortrak  SLP following    T2DM w/ hyperglycemia  Has intermittent episodes of hyperglycemia. plan Changed to insulin gtt 10/28 for better glycemic control Cont insulin gtt for goal 140-180  End-stage renal disease on hemodialysis (MWF at baseline) Has occluded bilateral [place] veins  plan CRRT as directed by nephro  Anemia/thrombocytopenia On-going issues w/ anemia. No obvious bleeding. Some of this may be due to hemolysis from  CRRT and freq filter clotting. S/p 4 units total PRBC from 25th to 28th Hgb stable over night. PLTs yesterday afternoon better Plan Trigger for transfusion < 7 or as needed for symptomatic bleeding  He has been off [place] heparin since 25th as his appt has been supra therapeutic (today first day has been w/in nml).  Cont SCDs  Incidental finding of Liver steatosis vs. Liver mass -CT abd/pelvis showing 7 x 4 cm density in anterior liver  plan Follow up outpatient   Best Practice (right click and "Reselect all SmartList Selections" daily)   Diet/type: tubefeeds DVT prophylaxis: SCD GI prophylaxis: N/A Lines: Central line, Dialysis Catheter, Arterial Line, and yes and it is still needed Foley:  Yes, and it is still needed Code Status:  full code Last date of multidisciplinary goals of care discussion Patient and mom updated at bedside am 10/27  I will call [facility] later today.  Still hard to believe he would be a good surgical candidate Critical care time: 35 min   Performed by: Shelby Mattocks  Total critical care time: 32 min

## 2023-07-27 NOTE — Progress Notes (Signed)
Speech Language Pathology Treatment: Dysphagia  Patient Details Name: [name] MRN: [id number] DOB: Feb 21, 1981 Today's Date: 07/27/2023 Time: 4098-1191 SLP Time Calculation (min) (ACUTE ONLY): 10 min  Assessment / Plan / Recommendation Clinical Impression  [name] continues to present with aphonia, weak/congested cough, poor tolerance of PO trials.  Less interactive today. Provided oral care.  He could not be stimulable to produce voice.  Limited ice chips offered due to wet cough elicited. Not ready for instrumental swallow study at this time.  D/W pt's aunt at bedside. Will continue to follow.    HPI HPI: Pt is a 42 yo male presenting 10/11 with AMS. His dialysis catheter was partially removed and he was in DKA. Admitted for septic shock from MRSA bacteremia; infective endocarditis. LUE weakness was noted 10/17 and MRI confirmed sizable infarct in the R MCA branch as well as small acute infarcts in the L occipital lobe and bilateral cerebellum. Pt initially evaluated by SLP 10/18 and recommended to have Dys 3 diet and thin liquids due primarily oral symptoms. Pt had worsening hypoxia and AMS after vomiting on 10/21 requiring intubation 10/21-10/24. 10/22 CRRT. PMH includes: ESRD, DM, gastroparesis, L AKA, HTN, recent admission in July for sepsis secondary to R diabetic foot ulcer (left AMA)      SLP Plan  Continue with current plan of care      Recommendations for follow up therapy are one component of a multi-disciplinary discharge planning process, led by the attending physician.  Recommendations may be updated based on patient status, additional functional criteria and insurance authorization.    Recommendations  Diet recommendations: NPO Medication Administration: Via alternative means                  Oral care QID   Frequent or constant Supervision/Assistance Dysphagia, oropharyngeal phase (R13.12)     Continue with current plan of care   [name] L. Shane Frederic, MA  CCC/SLP Clinical Specialist - Acute Care SLP Acute Rehabilitation Services Office number [phone]   [name] [name]  07/27/2023, 4:14 PM

## 2023-07-27 NOTE — Progress Notes (Addendum)
Pharmacy Antibiotic Note  [name] is a 42 y.o. 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 Pharmacy has been consulted for Vancomycin dosing.  The patient has been on CRRT and 10/22 and appears to be tolerating. A Vancomycin random level this morning was therapeutic at 17 mcg/ml (goal of 15-20 mcg/ml) however the result was delayed due to the machine being down.  Plan: - Vancomycin 500 mg IV x 1 dose now then resume Vancomycin 750 mg IV every 24 hours starting on 10/30 AM - Will continue to follow CRRT tolerance, off times, and transition back to HD  Height: 5\' 7"  (170.2 cm) Weight: 65.9 kg (145 lb 4.5 oz) IBW/kg (Calculated) : 66.1  Temp (24hrs), Avg:100.6 F (38.1 C), Min:99.8 F (37.7 C), Max:101 F (38.3 C)  Recent Labs  Lab 07/22/23 0437 07/22/23 0438 07/23/23 1803 07/23/23 2047 07/23/23 2211 07/24/23 0406 07/24/23 0407 07/24/23 0839 07/24/23 1342 07/24/23 1539 07/25/23 0403 07/25/23 1215 07/26/23 0355 07/26/23 0401 07/26/23 0945 07/26/23 1211 07/26/23 1359 07/26/23 1503 07/26/23 2219 07/26/23 2221 07/27/23 0302 07/27/23 0303 07/27/23 0359  WBC  --    < >  --  33.2*  --   --  24.3*  --  16.0*  --  13.8*  --  11.6*  --   --   --  15.0*  --   --   --   --   --   --   CREATININE 3.49*   < >  --   --   --   --  1.58*  --   --    < > 1.61*   < > 1.76*   < >  --    < >  --    < > 1.40* 1.10 1.50* 1.20 1.71*  LATICACIDVEN  --   --  2.0* 2.5* 3.7* 0.8  --  0.7  --   --   --   --   --   --   --   --   --   --   --   --   --   --   --   VANCOTROUGH  --   --   --   --   --   --   --   --   --   --   --   --   --   --  81*  --   --   --   --   --   --   --   --   VANCORANDOM 16  --   --   --   --   --   --   --   --   --   --   --   --   --   --   --   --   --   --   --   --   --   --    < > = values in this interval not displayed.    Estimated Creatinine Clearance: 51.9 mL/min (A) (by C-G formula based on SCr of 1.71  mg/dL (H)).    No Known Allergies  Antimicrobials this admission: Vancomycin 10/12 >> Cefepime 10/21 >> 10/23; restart 10/25 >> 10/29  Microbiology results: 10/12 MRSA PCR >> positive 10/12 CDiff antigen +, toxin -, PCR - >> negative 10/12 BCx >> 4/4 MRSA 10/13 BCx >> NGf 10/17 BCx >> NGf 10/21 RCx >> NOF  Thank you for allowing pharmacy to be a part of this patient's care.  Georgina Pillion, PharmD, BCPS, BCIDP Infectious Diseases Clinical Pharmacist 07/27/2023 8:07 AM   **Pharmacist phone directory can now be found on [email or web address] ([email or web address]).  Listed under [facility].

## 2023-07-27 NOTE — Progress Notes (Signed)
[facility] NEPHROLOGY PROGRESS NOTE  Subjective:  Seen and examined in ICU. Clotting resolved w/ citrate protocol.  Net I/O yesterday: -294 Pressors: vaso  Discussed w/ ICU RN, CCM and pt's aunt  Objective Vital signs in last 24 hours: Vitals:   07/26/23 1000 07/26/23 1015 07/26/23 1030 07/26/23 1045  BP:      Pulse: 99 100 97 100  Resp: (!) 24 (!) 25 (!) 21 (!) 24  Temp:      TempSrc:      SpO2: 100% 98% 99% 99%  Weight:      Height:       Physical Exam: General: ill appearing Heart:RRR, +systolic murmur Lungs: b/l chest expansion, normal wob Abdomen:soft, Non-tender, non-distended Extremities:No edema, left aka Neuro: awake, following some commands Dialysis Access: left femoral TDC  OP HD: MWF [facility] 4h  400/1.5    77kg   2/3 bath  [facility] TDC    Heparin none - last OP HD 10/7, post wt 74.6kg - coming off 75- 76kg - misses HD once every other week approx - rocaltrol 1.50 mcg  - no esa  CXR - 10/23 - mostly clear, poss vasc congestion  Assessment/ Plan: MRSA bacteremia with MV endocarditis -s/p line holiday, new TDC placed by IR 10/18 -on vanc and cefepime -per primary service and CTS-high risk for surgery, may need transfer to higher level of care to consider surgery but will need to be extubated first which he is  Acute ischemic CVA/septic infarct 10/17 -neuro signed off, per primary service  AHRF -possibly secondary to aspiration. Intubated 10/21, extubated 10/24 -per PCCM  Shock -pressor support per primary serivce  ESRD -typically on HD MWF -given ongoing shock, started CRRT 10/22 -UFG: is down 10kg and 10kg under dry wt --> changed to keep +50 cc/hr -pre and post: all 4k (post filter and dialysate) -A/C: was clotting excessively on max IV heparin thru circuit so was changed to citrate protocol 10/27. Discussed w/ RN -cont CRRT  Dialysis access - Per IR note, occluded both right and left subclavian on venography therefore successful  placement of left femoral TDC with the tip of the catheter in the infrarenal IVC.  BP/ vol - may be dry, BP's are volume responsive. Will keep + 50 cc/hr and NS/ LR boluses will not be including in hourly I/O's since they are given in an attempt to treat suspected vol depletion.   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 Transfuse PRN for hgb <7. On ESA, cont darbe 40 mcg IV weekly.   CKD MBD-  calcitriol per tube. Resume sensipar when taking PO but will see how is calcium is doing first. Replete PO4 IV PRN, holding binders given hypophos.   Thrombocytopenia -w/u per primary. Secondary to HIT vs sepsis? IV heparin thru circuit was dc'd and citrate protocol started 10/27.   Vinson Moselle  MD  [facility] 07/27/2023, 12:21 PM  Recent Labs  Lab 07/26/23 1503 07/26/23 1556 07/27/23 0303 07/27/23 0359 07/27/23 0814  HGB  --    < > 10.9*  --  11.2*  ALBUMIN 2.1*  --   --  2.0*  --   CALCIUM 9.2  --   --  9.8  --   PHOS 1.2*  --   --  2.6  --   CREATININE 1.77*   < > 1.20 1.71* 1.10  K 3.2*   < > 3.9 4.0 3.9   < > = values in this interval not displayed.  Inpatient medications:  sodium chloride   Intravenous Once   calcitRIOL  1.5 mcg Per Tube Q M,W,F   Chlorhexidine Gluconate Cloth  6 each Topical Q0600   clonazePAM  0.5 mg Oral BID   darbepoetin (ARANESP) injection - DIALYSIS  40 mcg Subcutaneous Q Fri-1800   feeding supplement (PROSource TF20)  60 mL Per Tube QID   fiber supplement (BANATROL TF)  60 mL Per Tube BID   gabapentin  100 mg Per Tube Q8H   midodrine  15 mg Per Tube Q8H   multivitamin  1 tablet Per Tube BID   mouth rinse  15 mL Mouth Rinse 4 times per day   QUEtiapine  100 mg Per Tube BID     prismasol BGK 4/2.5      prismasol BGK 4/2.5 400 mL/hr at 07/27/23 1136   calcium gluconate 20 g in dextrose 5 % 1,000 mL infusion 60 mL/hr at 07/27/23 1200   citrate dextrose 3,000 mL (07/27/23 0112)   feeding supplement (OSMOLITE 1.5 CAL) 55 mL/hr at 07/27/23 1200    heparin 10,000 units/ 20 mL infusion syringe Stopped (07/25/23 0906)   insulin 1.9 Units/hr (07/27/23 1213)   norepinephrine (LEVOPHED) Adult infusion 14 mcg/min (07/27/23 1200)   prismasol BGK 4/2.5 1,500 mL/hr at 07/27/23 1119   vancomycin Stopped (07/26/23 1049)   acetaminophen, acetaminophen, albuterol, dextrose, heparin, mouth rinse

## 2023-07-27 NOTE — Progress Notes (Signed)
Looks about the same Having some abd and general discomfort along w/ tachycardia  Plan Will rx as pain and cont rx as outlined.   Simonne Martinet ACNP-BC [facility] Pulmonary/Critical Care Pager # [phone] OR # [phone] if no answer

## 2023-07-27 NOTE — Progress Notes (Signed)
   Patient Name: [name] Date of Encounter: 07/27/2023 [facility] Cardiologist: None   Interval Summary  .    Overall laying in bed, Corpak in place, mitten in place.  Ongoing intermittent delirium.  He was able to look over at me and nod his head.  Vital Signs .    Vitals:   07/27/23 0630 07/27/23 0645 07/27/23 0700 07/27/23 0800  BP:      Pulse: (!) 128 (!) 129 (!) 127 (!) 130  Resp: (!) 33 (!) 28 (!) 21 (!) 33  Temp:      TempSrc:      SpO2: 99% 100% 99% 99%  Weight:      Height:        Intake/Output Summary (Last 24 hours) at 07/27/2023 0943 Last data filed at 07/27/2023 0900 Gross per 24 hour  Intake 3856.02 ml  Output 4306.2 ml  Net -450.18 ml      07/27/2023    5:00 AM 07/26/2023    5:00 AM 07/25/2023    5:00 AM  Last 3 Weights  Weight (lbs) 145 lb 4.5 oz 149 lb 0.5 oz 151 lb 14.4 oz  Weight (kg) 65.9 kg 67.6 kg 68.9 kg      Telemetry/ECG    No adverse arrhythmias, currently appears to be in either sinus tachycardia at 133 bpm or atrial tachycardia- Personally Reviewed  Physical Exam .   GEN: Ill-appearing Neck: No JVD Cardiac: Tachycardic regular no murmurs, rubs, or gallops.  Respiratory: Minimal crackles bilaterally GI: Soft, nontender, non-distended  MS: AKA left  Assessment & Plan .     42 year old with large vegetation on mitral valve with septic emboli MRSA bacteremia, right MCA stroke with left hemiparesis, left upper lobe cavitary lesion, left AKA, end-stage renal disease, diabetes, chronic systolic heart failure EF 30%, delirium, metabolic encephalopathy, liver mass  -Continuing with aggressive supportive care.  Appreciate critical care team.  Our surgical team evaluated, not a surgical candidate here.  In review of his multiple comorbidities and current events including stroke hemiparesis left AKA delirium inability to swallow effectively he is not a current surgical candidate for mitral valve replacement.  His overall  morbidity mortality is extremely high.  Discussed with care team.  Previous discussions with heart team.   For questions or updates, please contact [facility] Please consult [email or web address] for contact info under        Signed, Donato Schultz, MD

## 2023-07-28 ENCOUNTER — Inpatient Hospital Stay (HOSPITAL_COMMUNITY): Payer: Medicare HMO

## 2023-07-28 DIAGNOSIS — J9601 Acute respiratory failure with hypoxia: Secondary | ICD-10-CM | POA: Diagnosis not present

## 2023-07-28 DIAGNOSIS — I059 Rheumatic mitral valve disease, unspecified: Secondary | ICD-10-CM | POA: Diagnosis not present

## 2023-07-28 DIAGNOSIS — I5021 Acute systolic (congestive) heart failure: Secondary | ICD-10-CM | POA: Diagnosis not present

## 2023-07-28 LAB — POCT I-STAT EG7
Acid-Base Excess: 9 mmol/L — ABNORMAL HIGH (ref 0.0–2.0)
Bicarbonate: 37.1 mmol/L — ABNORMAL HIGH (ref 20.0–28.0)
Calcium, Ion: 0.4 mmol/L — CL (ref 1.15–1.40)
HCT: 31 % — ABNORMAL LOW (ref 39.0–52.0)
Hemoglobin: 10.5 g/dL — ABNORMAL LOW (ref 13.0–17.0)
O2 Saturation: 54 %
Patient temperature: 37.6
Potassium: 4 mmol/L (ref 3.5–5.1)
Sodium: 139 mmol/L (ref 135–145)
TCO2: 39 mmol/L — ABNORMAL HIGH (ref 22–32)
pCO2, Ven: 69.5 mm[Hg] — ABNORMAL HIGH (ref 44–60)
pH, Ven: 7.338 (ref 7.25–7.43)
pO2, Ven: 33 mm[Hg] (ref 32–45)

## 2023-07-28 LAB — POCT I-STAT, CHEM 8
BUN: 22 mg/dL — ABNORMAL HIGH (ref 6–20)
BUN: 25 mg/dL — ABNORMAL HIGH (ref 6–20)
BUN: 27 mg/dL — ABNORMAL HIGH (ref 6–20)
Calcium, Ion: 0.32 mmol/L — CL (ref 1.15–1.40)
Calcium, Ion: 1.04 mmol/L — ABNORMAL LOW (ref 1.15–1.40)
Calcium, Ion: 1.11 mmol/L — ABNORMAL LOW (ref 1.15–1.40)
Chloride: 86 mmol/L — ABNORMAL LOW (ref 98–111)
Chloride: 89 mmol/L — ABNORMAL LOW (ref 98–111)
Chloride: 90 mmol/L — ABNORMAL LOW (ref 98–111)
Creatinine, Ser: 1.1 mg/dL (ref 0.61–1.24)
Creatinine, Ser: 1.5 mg/dL — ABNORMAL HIGH (ref 0.61–1.24)
Creatinine, Ser: 1.6 mg/dL — ABNORMAL HIGH (ref 0.61–1.24)
Glucose, Bld: 146 mg/dL — ABNORMAL HIGH (ref 70–99)
Glucose, Bld: 179 mg/dL — ABNORMAL HIGH (ref 70–99)
Glucose, Bld: 201 mg/dL — ABNORMAL HIGH (ref 70–99)
HCT: 24 % — ABNORMAL LOW (ref 39.0–52.0)
HCT: 29 % — ABNORMAL LOW (ref 39.0–52.0)
HCT: 30 % — ABNORMAL LOW (ref 39.0–52.0)
Hemoglobin: 10.2 g/dL — ABNORMAL LOW (ref 13.0–17.0)
Hemoglobin: 8.2 g/dL — ABNORMAL LOW (ref 13.0–17.0)
Hemoglobin: 9.9 g/dL — ABNORMAL LOW (ref 13.0–17.0)
Potassium: 3.8 mmol/L (ref 3.5–5.1)
Potassium: 4 mmol/L (ref 3.5–5.1)
Potassium: 4 mmol/L (ref 3.5–5.1)
Sodium: 138 mmol/L (ref 135–145)
Sodium: 138 mmol/L (ref 135–145)
Sodium: 140 mmol/L (ref 135–145)
TCO2: 33 mmol/L — ABNORMAL HIGH (ref 22–32)
TCO2: 35 mmol/L — ABNORMAL HIGH (ref 22–32)
TCO2: 38 mmol/L — ABNORMAL HIGH (ref 22–32)

## 2023-07-28 LAB — CULTURE, BLOOD (ROUTINE X 2)
Culture: NO GROWTH
Culture: NO GROWTH
Special Requests: ADEQUATE

## 2023-07-28 LAB — POCT I-STAT 7, (LYTES, BLD GAS, ICA,H+H)
Acid-Base Excess: 15 mmol/L — ABNORMAL HIGH (ref 0.0–2.0)
Acid-Base Excess: 15 mmol/L — ABNORMAL HIGH (ref 0.0–2.0)
Bicarbonate: 39.2 mmol/L — ABNORMAL HIGH (ref 20.0–28.0)
Bicarbonate: 39.6 mmol/L — ABNORMAL HIGH (ref 20.0–28.0)
Calcium, Ion: 1.08 mmol/L — ABNORMAL LOW (ref 1.15–1.40)
Calcium, Ion: 1.21 mmol/L (ref 1.15–1.40)
HCT: 32 % — ABNORMAL LOW (ref 39.0–52.0)
HCT: 30 % — ABNORMAL LOW (ref 39.0–52.0)
Hemoglobin: 10.9 g/dL — ABNORMAL LOW (ref 13.0–17.0)
Hemoglobin: 10.2 g/dL — ABNORMAL LOW (ref 13.0–17.0)
O2 Saturation: 93 %
O2 Saturation: 85 %
Patient temperature: 37.6
Patient temperature: 100.9
Potassium: 4 mmol/L (ref 3.5–5.1)
Potassium: 5.2 mmol/L — ABNORMAL HIGH (ref 3.5–5.1)
Sodium: 138 mmol/L (ref 135–145)
Sodium: 137 mmol/L (ref 135–145)
TCO2: 41 mmol/L — ABNORMAL HIGH (ref 22–32)
TCO2: 41 mmol/L — ABNORMAL HIGH (ref 22–32)
pCO2 arterial: 48.7 mm[Hg] — ABNORMAL HIGH (ref 32–48)
pCO2 arterial: 50.7 mm[Hg] — ABNORMAL HIGH (ref 32–48)
pH, Arterial: 7.516 — ABNORMAL HIGH (ref 7.35–7.45)
pH, Arterial: 7.505 — ABNORMAL HIGH (ref 7.35–7.45)
pO2, Arterial: 63 mm[Hg] — ABNORMAL LOW (ref 83–108)
pO2, Arterial: 49 mm[Hg] — ABNORMAL LOW (ref 83–108)

## 2023-07-28 LAB — RENAL FUNCTION PANEL
Albumin: 1.9 g/dL — ABNORMAL LOW (ref 3.5–5.0)
Anion gap: 14 (ref 5–15)
BUN: 27 mg/dL — ABNORMAL HIGH (ref 6–20)
CO2: 33 mmol/L — ABNORMAL HIGH (ref 22–32)
Calcium: 9.7 mg/dL (ref 8.9–10.3)
Chloride: 92 mmol/L — ABNORMAL LOW (ref 98–111)
Creatinine, Ser: 1.64 mg/dL — ABNORMAL HIGH (ref 0.61–1.24)
GFR, Estimated: 53 mL/min — ABNORMAL LOW (ref >60)
Glucose, Bld: 173 mg/dL — ABNORMAL HIGH (ref 70–99)
Phosphorus: 2.5 mg/dL (ref 2.5–4.6)
Potassium: 4.3 mmol/L (ref 3.5–5.1)
Sodium: 139 mmol/L (ref 135–145)

## 2023-07-28 LAB — GLUCOSE, CAPILLARY
Glucose-Capillary: 139 mg/dL — ABNORMAL HIGH (ref 70–99)
Glucose-Capillary: 135 mg/dL — ABNORMAL HIGH (ref 70–99)
Glucose-Capillary: 174 mg/dL — ABNORMAL HIGH (ref 70–99)
Glucose-Capillary: 161 mg/dL — ABNORMAL HIGH (ref 70–99)
Glucose-Capillary: 173 mg/dL — ABNORMAL HIGH (ref 70–99)
Glucose-Capillary: 104 mg/dL — ABNORMAL HIGH (ref 70–99)
Glucose-Capillary: 181 mg/dL — ABNORMAL HIGH (ref 70–99)
Glucose-Capillary: 98 mg/dL (ref 70–99)
Glucose-Capillary: 167 mg/dL — ABNORMAL HIGH (ref 70–99)
Glucose-Capillary: 82 mg/dL (ref 70–99)
Glucose-Capillary: 103 mg/dL — ABNORMAL HIGH (ref 70–99)
Glucose-Capillary: 153 mg/dL — ABNORMAL HIGH (ref 70–99)

## 2023-07-28 LAB — MAGNESIUM: Magnesium: 1.9 mg/dL (ref 1.7–2.4)

## 2023-07-28 LAB — APTT: aPTT: 31 s (ref 24–36)

## 2023-07-28 LAB — CALCIUM, IONIZED: Calcium, Ionized, Serum: 4.7 mg/dL (ref 4.5–5.6)

## 2023-07-28 MED ORDER — HEPARIN SODIUM (PORCINE) 5000 UNIT/ML IJ SOLN
INTRAMUSCULAR | Status: AC
  Filled 2023-07-28: qty 1

## 2023-07-28 MED ORDER — INSULIN DETEMIR 100 UNIT/ML ~~LOC~~ SOLN
6.0000 [IU] | Freq: Two times a day (BID) | SUBCUTANEOUS | Status: DC
  Administered 2023-07-28 – 2023-07-29 (×3): 6 [IU] via SUBCUTANEOUS
  Filled 2023-07-28 (×4): qty 0.06

## 2023-07-28 MED ORDER — OSMOLITE 1.5 CAL PO LIQD
1000.0000 mL | ORAL | Status: DC
  Administered 2023-07-28 – 2023-07-31 (×5): 1000 mL
  Filled 2023-07-28 (×4): qty 1000

## 2023-07-28 MED ORDER — JUVEN PO PACK
1.0000 | PACK | Freq: Two times a day (BID) | ORAL | Status: DC
  Administered 2023-07-29 – 2023-08-13 (×24): 1
  Filled 2023-07-28 (×25): qty 1

## 2023-07-28 MED ORDER — HEPARIN SODIUM (PORCINE) 1000 UNIT/ML DIALYSIS
1000.0000 [IU] | Freq: Once | INTRAMUSCULAR | Status: AC
  Administered 2023-07-28: 4600 [IU] via INTRAVENOUS_CENTRAL
  Filled 2023-07-28: qty 6

## 2023-07-28 MED ORDER — SODIUM CHLORIDE 0.9 % IV BOLUS
500.0000 mL | Freq: Once | INTRAVENOUS | Status: AC
  Administered 2023-07-28: 500 mL via INTRAVENOUS

## 2023-07-28 MED ORDER — PROSOURCE TF20 ENFIT COMPATIBL EN LIQD
60.0000 mL | Freq: Two times a day (BID) | ENTERAL | Status: DC
  Administered 2023-07-29 – 2023-08-03 (×11): 60 mL
  Filled 2023-07-28 (×11): qty 60

## 2023-07-28 MED ORDER — INSULIN ASPART 100 UNIT/ML IJ SOLN
2.0000 [IU] | INTRAMUSCULAR | Status: DC
  Administered 2023-07-28 – 2023-07-30 (×7): 2 [IU] via SUBCUTANEOUS

## 2023-07-28 MED ORDER — INSULIN ASPART 100 UNIT/ML IJ SOLN
2.0000 [IU] | INTRAMUSCULAR | Status: DC
  Administered 2023-07-28 – 2023-07-29 (×4): 4 [IU] via SUBCUTANEOUS

## 2023-07-28 MED ORDER — HEPARIN SODIUM (PORCINE) 1000 UNIT/ML DIALYSIS: 1000.0000 [IU] | INTRAMUSCULAR | Status: DC | PRN

## 2023-07-28 NOTE — Progress Notes (Addendum)
[facility]  Date of Admission:  07/09/2023     Principal Problem:   Endocarditis of mitral valve Active Problems:   Insulin dependent type 1 diabetes mellitus ([name])   Diabetic gastroparesis ([name])   Septic shock ([name])   Anemia of chronic disease   Iron deficiency anemia, unspecified   Secondary hyperparathyroidism of renal origin ([name])   Thrombocytopenia ([name])   Acute metabolic encephalopathy   MRSA bacteremia   Right elbow pain   ESRD on hemodialysis ([name])   Acute systolic CHF (congestive heart failure) ([name])   DCM (dilated cardiomyopathy) ([name])   Coronary artery calcification seen on CAT scan   Infective endocarditis of tricuspid valve   Protein-calorie malnutrition, severe   Cavitary pneumonia   Perforation of leaflet of mitral valve   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 With MRSA bacteremia, right MCA stroke, LUL cavitation #Fever , leukocytosis -Contact precautions -Cefepime was resumed on 10/25, pt had increased pressor requirement as received blood for hgb of 6.  Recommendations: -Consider removing lines given fever and leukocytosis -Blood Cx form 10.25 NG, stop cefepime -Continue vancomycin -C diff Ag+, toxin-, PCR negative, no consistent with active C diff infection   #AKI with ESRD -started CRRT on 10/22  Microbiology:   Antibiotics: Vanc+ cefepime   SUBJECTIVE: IN bed, family at bedsie  Review of Systems: Review of Systems  All other systems reviewed and are negative.    Scheduled Meds:   sodium chloride   Intravenous Once   calcitRIOL  1.5 mcg Per Tube Q M,W,F   Chlorhexidine Gluconate Cloth  6 each Topical Q0600   clonazePAM  0.5 mg Oral BID   darbepoetin (ARANESP) injection - DIALYSIS  40 mcg Subcutaneous Q Fri-1800   feeding supplement (PROSource TF20)  60 mL Per Tube QID   fiber supplement (BANATROL TF)  60 mL Per Tube BID   gabapentin  100 mg Per Tube Q8H   midodrine  15 mg Per Tube Q8H   multivitamin  1 tablet Per Tube BID   mouth rinse  15 mL Mouth Rinse 4 times per day   QUEtiapine  100 mg Per Tube BID   Continuous Infusions:   prismasol BGK 4/2.5 400 mL/hr at 07/28/23 0308   sodium chloride 50 mL/hr at 07/27/23 1500   calcium gluconate 20 g in dextrose 5 % 1,000 mL infusion 60 mL/hr at 07/28/23 0400   citrate dextrose 3,000 mL (07/27/23 0112)   feeding supplement (OSMOLITE 1.5 CAL) 55 mL/hr at 07/28/23 0400   heparin 10,000 units/ 20 mL infusion syringe Stopped (07/25/23 0906)   insulin 2.4 Units/hr (07/28/23 0400)   norepinephrine (LEVOPHED) Adult infusion 9 mcg/min (07/28/23 0400)   prismasol BGK 4/2.5 1,500 mL/hr at 07/28/23 0308   vancomycin     PRN Meds:.acetaminophen, acetaminophen, albuterol, dextrose, heparin, mouth rinse, oxyCODONE No Known Allergies  OBJECTIVE: Vitals:   07/28/23 0330 07/28/23 0345 07/28/23 0400 07/28/23 0415  BP:  Pulse: (!) 139 (!) 140 (!) 140 (!) 138  Resp: (!) 35 (!) 28 (!) 25 (!) 30  Temp:      TempSrc:      SpO2: 99% 100% 100% 99%  Weight:      Height:       Body mass index is 22.75 kg/m.  Physical Exam HENT:     Head: Normocephalic and atraumatic.     Right Ear: External ear normal.     Left Ear: External ear normal.     Nose: No congestion or rhinorrhea.     Mouth/Throat:     Mouth: Mucous membranes are moist.     Pharynx: Oropharynx is clear.  Eyes:     Extraocular Movements: Extraocular movements intact.     Conjunctiva/sclera: Conjunctivae normal.     Pupils: Pupils are equal, round, and  reactive to light.  Cardiovascular:     Rate and Rhythm: Normal rate and regular rhythm.     Heart sounds: No murmur heard.    No friction rub. No gallop.  Pulmonary:     Effort: Pulmonary effort is normal.     Breath sounds: Normal breath sounds.  Abdominal:     General: Abdomen is flat. Bowel sounds are normal.     Palpations: Abdomen is soft.  Musculoskeletal:        General: No swelling.     Cervical back: Normal range of motion and neck supple.  Skin:    General: Skin is warm and dry.  Psychiatric:        Mood and Affect: Mood normal.       Lab Results Lab Results  Component Value Date   WBC 15.0 (H) 07/26/2023   HGB 10.2 (L) 07/28/2023   HCT 30.0 (L) 07/28/2023   MCV 93.9 07/26/2023   PLT 107 (L) 07/26/2023    Lab Results  Component Value Date   CREATININE 1.64 (H) 07/28/2023   BUN 27 (H) 07/28/2023   NA 139 07/28/2023   K 4.3 07/28/2023   CL 92 (L) 07/28/2023   CO2 33 (H) 07/28/2023    Lab Results  Component Value Date   ALT 6 07/24/2023   AST 18 07/24/2023   ALKPHOS 135 (H) 07/24/2023   BILITOT 2.9 (H) 07/24/2023        Danelle Earthly, MD [facility] 07/28/2023, 6:04 AM I have personally spent 52 minutes involved in face-to-face and non-face-to-face activities for this patient on the day of the visit. Professional time spent includes the following activities: Preparing to see the patient (review of tests), Obtaining and/or reviewing separately obtained history (admission/discharge record), Performing a medically appropriate examination and/or evaluation , Ordering medications/tests/procedures, referring and communicating with other health care professionals, Documenting clinical information in the EMR, Independently interpreting results (not separately reported), Communicating results to the patient/family/caregiver, Counseling and educating the patient/family/caregiver and Care coordination (not  separately reported).

## 2023-07-28 NOTE — Progress Notes (Signed)
Nutrition Follow-up  DOCUMENTATION CODES:   Severe malnutrition in context of chronic illness  INTERVENTION:   Increase Osmolite 1.5 to 10ml/hr continuous + ProSource TF 20- Give 60ml BID via tube  Free water flushes 30ml q4 hours to maintain tube patency   Regimen provides 2500kcal/day, 138g/day protein and 1352ml/day of free water.   Rena-vit daily via tube   Add Juven Fruit Punch BID via tube, each serving provides 95kcal and 2.5g of protein (amino acids glutamine and arginine)  Continue Banatrol 60ml BID via tube   Daily weights   NUTRITION DIAGNOSIS:   Severe Malnutrition related to chronic illness as evidenced by severe fat depletion, severe muscle depletion. -ongoing   GOAL:   Patient will meet greater than or equal to 90% of their needs -met   MONITOR:   Vent status, Labs, Weight trends, TF tolerance, I & O's, Skin  ASSESSMENT:   42 y/o male with h/o CHF, ESRD on HD, DM, R AKA and gastroparesis who is admitted with DKA, AMS, septic shock, MRSA bacteremia, endocarditis of mitral valve, cavitary pneumonia and combined systolic/diastolic heart failure with cardiogenic shock complicatedby CVA noted on 10/17, thrombocytopenia and liver mass.  Visited pt's room today. Pt shakes his head no when asked if he is feeling ok today. Pt remains NPO per SLP. Cortrak tube remains in place at 65cm and pt is tolerating tube feeds a goal rate. Pt continues to receive banatrol for liquid stools. CRRT discontinued today for holiday. Per chart, pt is down ~36lbs since admission; RD unsure of pt's UBW. Pt with MASD; will add Juven to support healing. Plan is for possible transfer to [facility] once a bed becomes available.   Medications reviewed and include: calcitriol, darbepoetin, insulin, midodrine, rena-vit, levophed, vancomycin   Labs reviewed: Na 139 wnl, K 4.0 wnl, ionized Ca 0.40(L), P 2.5 wnl, Mg 1.9 wnl Hgb 10.5(L), Hct 31.0(L) cbgs- 167, 98, 181, 104, 173, 161, 174 x 24 hrs    NUTRITION - FOCUSED PHYSICAL EXAM:  Flowsheet Row Most Recent Value  Orbital Region Moderate depletion  Upper Arm Region No depletion  Thoracic and Lumbar Region Mild depletion  Buccal Region No depletion  Temple Region Mild depletion  Clavicle Bone Region Moderate depletion  Clavicle and Acromion Bone Region Moderate depletion  Scapular Bone Region Mild depletion  Dorsal Hand No depletion  Patellar Region Severe depletion  Anterior Thigh Region Severe depletion  Posterior Calf Region Severe depletion  Edema (RD Assessment) Mild  Hair Reviewed  Eyes Reviewed  Mouth Reviewed  Skin Reviewed  Nails Reviewed   Diet Order:   Diet Order             Diet NPO time specified  Diet effective now                  EDUCATION NEEDS:   Not appropriate for education at this time  Skin:  Skin Assessment: Skin Integrity Issues: Skin Integrity Issues:: Other (Comment) Other: skin tear and MASD to rectum, buttocks  Last BM:  10/30- via rectal tube  Height:   Ht Readings from Last 1 Encounters:  07/19/23 5\' 7"  (1.702 m)    Weight:   Wt Readings from Last 1 Encounters:  07/28/23 61.4 kg    Ideal Body Weight:  61.9 kg (adjusted for L AKA)  BMI:  Body mass index is 21.2 kg/m.  Estimated Nutritional Needs:   Kcal:  2200-2500kcal/day  Protein:  120-135g/day  Fluid:  UOP +1L  Betsey Holiday  MS, RD, LDN Please refer to [facility] for RD and/or RD on-call/weekend/after hours pager

## 2023-07-28 NOTE — Progress Notes (Signed)
Called to bedside for somnolence and hypoxia. He has woken up some since his ABG and he has had several sedating meds in the last few hours.   BP (!) 136/99   Pulse (!) 132   Temp 99.2 F (37.3 C) (Oral)   Resp 19   Ht 5\' 7"  (1.702 m)   Wt 61.4 kg   SpO2 96%   BMI 21.20 kg/m  Ill appearing man, fatigued, lying in bed in NAD Arouses to verbal stimulation, trying to talk but weak voice. Hemiparetic on the left, but able to move RUE & RLE. Lifting his head off the bed.  Mild tachypnea, on 3L Divide currently  CXR personally reviewed> mild pulm edema, LUL cavity  Plan: No need for intubation currently; suspect some relative hypoventilation with atelectasis due to sleep. He has a significant LUL cavitary that I suspect may be related to his endocarditis and could contribute to gas exchange abnormalities; although he didn't have a visible vegetation on the TV on recent TEE, he has significant TR. Echo on 10/16 did show mobile masses on the TV that have likely since broken off. Suggest a CT chest to evaluate his cavities at some point, but not urgent tonight. He is on vanc, which should adequately cover the lungs.  Family member at bedside updated this evening.   Steffanie Dunn, DO 07/28/23 11:25 PM [facility]  For contact information, see Amion. If no response to pager, please call PCCM consult pager. After hours, 7PM- 7AM, please call Elink.

## 2023-07-28 NOTE — Inpatient Diabetes Management (Signed)
Inpatient Diabetes Program Recommendations  AACE/ADA: New Consensus Statement on Inpatient Glycemic Control (2015)  Target Ranges:  Prepandial:   less than 140 mg/dL      Peak postprandial:   less than 180 mg/dL (1-2 hours)      Critically ill patients:  140 - 180 mg/dL   Lab Results  Component Value Date   GLUCAP 104 (H) 07/28/2023   HGBA1C 8.9 (H) 07/10/2023    Review of Glycemic Control  Latest Reference Range & Units 07/28/23 03:58 07/28/23 05:04 07/28/23 06:02 07/28/23 08:17  Glucose-Capillary 70 - 99 mg/dL 161 (H) 096 (H) 045 (H) 104 (H)  (H): Data is abnormally high  Diabetes history: Type 1 DM  Outpatient Diabetes medications:  Novolin R 12 units TID, Novolin 70/30 10 units BID  Current orders for Inpatient glycemic control:  IV insulin  Vital 1.5 cal- 55 ml/hr    Inpatient Diabetes Program Recommendations:     When ready to transition: Consider: -Levemir 6 units two hours prior to discontinuation then BID -Novolog 0-6 units Q4H -adding Novolog 2 units q 4 hours for tube feed coverage.  Thanks, Lujean Rave, MSN, RNC-OB Diabetes Coordinator [phone] (8a-5p)

## 2023-07-28 NOTE — Progress Notes (Signed)
OT Cancellation Note  Patient Details Name: [name] MRN: [id number] DOB: [birth year]   Cancelled Treatment:    Reason Eval/Treat Not Completed: Medical issues which prohibited therapy. RN reports pt following some commands, but pt with HR of 147 on arrival. Will check back as schedule allows.   Tyler Deis, OTR/L [facility] Acute Rehabilitation Office: [phone]   [name] 07/28/2023, 11:01 AM

## 2023-07-28 NOTE — Progress Notes (Signed)
NAME:  [name], MRN:  [id number], DOB:  Jun 11, 1981, LOS: 18 ADMISSION DATE:  07/09/2023, CONSULTATION DATE:  07/10/2023 REFERRING MD:  Suezanne Jacquet, CHIEF COMPLAINT:  DKA, AMS   History of Present Illness:  Patient is a 42 yo M w/ pertinent PMH ESRD on HD MWF, IDDM, gastroparesis, left AKA, HTN presents to [facility] on 10/11 w/ AMS.   Patient recently admitted to [facility] on 7/12 sepsis from right diabetic foot ulcer. Cultures w/ MRSA. Patient had dialysis catheter removed and replaced. ID treated w/ vanc. Patient refused echo and left on 7/17 against medical advise.    On 10/11 patient was found by family member on ground altered. EMS arrived and dialysis catheter partially removed. Patient more alert for them. Has been complaining of diarrhea x1 week per family. Unsure if patient went to dialysis today. Transferred to [facility] ED. On arrival bp stable 107/70 and afebrile. Patient remains confused and occasionally with agitation. Given dilaudid. CBG 497. Beta H >8. VBG ph 7.06, 16, 54, 4.7. Given iv fluids and started on insulin drip for DKA. CT head negative for acute abnormality. Cxr no significant findings. CT abd/pelvis no acute findings; possible steatosis vs. Liver mass 7 x 4 cm recommending f/u w/ MRI. WBC 20.7 and LA 2.4. Sepsis protocol initiated. Cultures obtained and started on vanc/zosyn. Given ams, dka, and soft bp pccm consulted for icu admission.   10/21 pccm called overnight to assess for worsening hypoxia and AMS after vomiting. On NRB w/ sats 70-80s. Hypoglycemic given dextrose which improved his mental status some. Transferred to 2H icu for intubation.     Pertinent  Medical History  ESRD on HD, HTN, diabetes, diabetic gastroparesis, cellulitis of left foot, left AKA  Significant Hospital Events: Including procedures, antibiotic start and stop dates in addition to other pertinent events   10/12: altered mental status, DKA 10/12: blood cultures positive for MRSA 10/13 TTE LVEF 25-30% w/  severely depressed LVF, global hypokinesis, gd I diastolic dysfxn, mod RV fxn reduction. Possible filamentous structure on MV 10/16 TEE LVEF 30-35%RV fxn nml. Large mass (2.00 cm x 0.88 cm ) on the posterior mitral leaflet which appeared to be extending in the mitral annulus. There are two mobile masses visible on the anterior leaflet of the tricupid valve which also appears to have a perforation. Another mobile  mass ( 0.68 cm x 0.67 cm) on the posterior tricupid leaflet. Moderate Tricuspid regurgitation which appears to  be through the leaflet perforation  10/16 seen by Cardiothoracic surg. Felt poor candidate. Recommended 6 weeks abx and recommended repeat TEE 1 week 10/17 noting left arm weakness. Not clear when started.  MRI showed Right MCA infarct w/ acute infarcts in left occipital and bilateral cerebellum felt c/w septic emboli. Neuro consulted.  10/18 tunneled left femoral vasc cath placed after catheter holiday  10/20 palliative care consulted later that day worsening hypoxia. Sats 70s. After vomiting. More lethargic  10/21: resp failure requiring intubation shortly after midnight  10/22- TEE 10/22 with EF 35%, LV global hypokinesis. Very large vegetation lateral scallop of posterior mitral leaflet 3.9cm length 1 cm width, highly mobile, prolapsing into LV. No large vegetation seen on TV.  Later that afternoon and abrupt hemodynamic changes requiring escalation of vasopressor support.  Felt given the size of his vegetation and increased bulk of the vegetation infections unlikely to clear.  He was felt to be decompensation may have due to of breath leaflet perforation and acute mitral valve regurgb 10/23: per nursing less  responsive than yesterday, increased ETT secretions. Clotting off CRRT. Ct head showing expected evolution of the right MCA stroke.  Cardiothoracic surgery re-consulted for worsening MVE. Felt too high risk here. Recommended referral to tertiary care  10/24 agitated when  sedation decreased, is purposeful but does not follow commands. Tolerating UF gaols of -150. Still on NE infusion but extubated later that afternoon after passing SBT 10/25 failed swallow eval still on just 3 lpm O2. Encephalopathic. Added seroquel in effort to get him off the precedex. Cardiology told no beds at [facility]. Not clear if accepted or not. Worsening pressor requirements. Got blood for hgb 6. Cefepime resumed for clinical decline and leukocytosis  10/26 hemoglobin again dropped to 6.3 overnight with increased pressor requirement yesterday afternoon now on vaso and levo. 10/27 hypoglycemic episode overnight, mental status improved a little over weekend  10/28 getting another unit of blood more awake.  Oriented x 2.  Still on Precedex.  Having liquid stools. 10/30 off Precedex, low-dose levo.  Plan to transition to IHD  Interim History / Subjective:  Able to state name this a.m.  Objective   Blood pressure (!) 120/52, pulse (!) 134, temperature 99.7 F (37.6 C), temperature source Oral, resp. rate (!) 24, height 5\' 7"  (1.702 m), weight 61.4 kg, SpO2 100%.        Intake/Output Summary (Last 24 hours) at 07/28/2023 0839 Last data filed at 07/28/2023 0800 Gross per 24 hour  Intake 3324.77 ml  Output 2632.1 ml  Net 692.67 ml   Filed Weights   07/26/23 0500 07/27/23 0500 07/28/23 0500  Weight: 67.6 kg 65.9 kg 61.4 kg    Examination: General: Acute on chronic ill-appearing severely deconditioned adult male lying in bed in no acute distress HEENT: [place]/AT, MM pink/moist, PERRL,  Neuro: Alert and oriented x 1, underlying encephalopathy CV: s1s2 regular rate and rhythm, no murmur, rubs, or gallops,  PULM: Slightly diminished bilaterally, no increased work of breathing, no added breath sounds, on room air GI: soft, bowel sounds active in all 4 quadrants, non-tender, non-distended, tolerating TF Extremities: warm/dry, no edema  Skin: no rashes or lesions  Resolved Hospital Problem list    DKA  Septic shock   Assessment & Plan:  Acute hypoxic respiratory failure; due to aspiration PNA, evolving cavitary PNA and mucous plugging  -cxr has been stable.  -LUL cavitary lesion unchanged.  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 Did have clinical decline on 25th so added back GNR coverage. Since then his WBC ct has improved.  P: Remains on vancomycin with extended course Continue cefepime x 7 days total Goal of the euvolemia Continue midodrine at increased dose Repeat blood cultures remain negative to date Wean pressors for SBP goal greater than 90  Combined systolic/diastolic CHF Cardiogenic shock  -He was deferred by cardiothoracic surgery here, they recommended tertiary care consultation.   P: Continuous telemetry Strict intake and output Daily weight Volume removal per CRRT GDMT when able Denied by [facility] for transfer 10/29, will reengage in 48 hours  Acute metabolic encephalopathy w/ agitated delirium  Acute Right MCA  stroke/septic infarct w/ left sided hemiparesis   -Improved over weekend to point he was having conversation w/ family and  watching programs on ipad. His exam however has not been consistent. Slow to respond to medical staff. At times seems to have expressive aphasia  P: Maintain neuroprotective measures Aspiration precautions Delirium precautions Continue scheduled Seroquel and Klonopin  Dysphagia P; Continue tube feeds via Cortrak SLP following N.p.o.  T2DM w/ hyperglycemia  -Changed to insulin gtt 10/28 for better glycemic control P: Remains on insulin drip CBG goal 140-180  End-stage  renal disease on hemodialysis (MWF at baseline) Has occluded bilateral [place] veins  P: Continue CRRT per nephrology  Anemia/thrombocytopenia -On-going issues w/ anemia. No obvious bleeding. Some of this may be due to hemolysis from  CRRT and freq filter clotting. S/p 4 units total PRBC from 25th to 28th -He has been off [place] heparin since 25th as his appt has been supra therapeutic P: Transfuse per protocol Hemoglobin goal greater than 7 Monitor for signs of bleeding Continue SCDs Monitor APTT  Incidental finding of Liver steatosis vs. Liver mass -CT abd/pelvis showing 7 x 4 cm density in anterior liver  P: Follow-up  Best Practice (right click and "Reselect all SmartList Selections" daily)   Diet/type: tubefeeds DVT prophylaxis: SCD GI prophylaxis: N/A Lines: Central line, Dialysis Catheter, Arterial Line, and yes and it is still needed Foley:  Yes, and it is still needed Code Status:  full code Last date of multidisciplinary goals of care discussion: Continue to update patient and family daily  Critical care time:  CRITICAL CARE Performed by: Kenyonna Micek D. Harris   Total critical care time: 38 minutes  Critical care time was exclusive of separately billable procedures and treating other patients.  Critical care was necessary to treat or prevent imminent or life-threatening deterioration.  Critical care was time spent personally by me on the following activities: development of treatment plan with patient and/or surrogate as well as nursing, discussions with consultants, evaluation of patient's response to treatment, examination of patient, obtaining history from patient or surrogate, ordering and performing treatments and interventions, ordering and review of laboratory studies, ordering and review of radiographic studies, pulse oximetry and re-evaluation of patient's condition.  Rosemaria Inabinet D. Harris, NP-C [facility] Personal contact information can be found  on Amion  If no contact or response made please call [phone] 07/28/2023, 9:01 AM

## 2023-07-28 NOTE — Progress Notes (Signed)
   Patient Name: [name] Date of Encounter: 07/28/2023    Vital Signs .    Vitals:   07/28/23 0615 07/28/23 0700 07/28/23 0730 07/28/23 0800  BP:      Pulse: (!) 134 (!) 132  (!) 130  Resp: (!) 24 (!) 24  (!) 27  Temp:   99.7 F (37.6 C)   TempSrc:   Oral   SpO2: 100% 97%  99%  Weight:      Height:        Intake/Output Summary (Last 24 hours) at 07/28/2023 0957 Last data filed at 07/28/2023 0800 Gross per 24 hour  Intake 3097.72 ml  Output 2509.7 ml  Net 588.02 ml      07/28/2023    5:00 AM 07/27/2023    5:00 AM 07/26/2023    5:00 AM  Last 3 Weights  Weight (lbs) 135 lb 5.8 oz 145 lb 4.5 oz 149 lb 0.5 oz  Weight (kg) 61.4 kg 65.9 kg 67.6 kg       Assessment & Plan .     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  Mouthing some words.  Corpak in place.  Tachycardic regular with telemetry personally reviewed showing 130 bpm incessantly likely atrial tachycardia. -Appreciate critical care team, there was an updated discussion with [facility] which is currently at capacity. -High current morbidity/mortality.    For questions or updates, please contact [facility] Please consult [email or web address] for contact info under        Signed, Donato Schultz, MD

## 2023-07-28 NOTE — Progress Notes (Signed)
[facility] NEPHROLOGY PROGRESS NOTE  Subjective:  Seen and examined in ICU. Clotting resolved w/ citrate protocol.  Net I/O yesterday: -150 cc Pressors: levo at 10  Discussed w/ ICU RN and CCM  Objective Vital signs in last 24 hours: Vitals:   07/26/23 1000 07/26/23 1015 07/26/23 1030 07/26/23 1045  BP:      Pulse: 99 100 97 100  Resp: (!) 24 (!) 25 (!) 21 (!) 24  Temp:      TempSrc:      SpO2: 100% 98% 99% 99%  Weight:      Height:       Physical Exam: General: ill appearing Heart:RRR, +systolic murmur Lungs: b/l chest expansion, normal wob Abdomen:soft, Non-tender, non-distended Extremities:No edema, left aka Neuro: awake, following some commands Dialysis Access: left femoral TDC  OP HD: MWF [facility] 4h  400/1.5    77kg   2/3 bath  [facility] TDC    Heparin none - last OP HD 10/7, post wt 74.6kg - coming off 75- 76kg - misses HD once every other week approx - rocaltrol 1.50 mcg  - no esa  CXR - 10/23 - mostly clear, poss vasc congestion  Assessment/ Plan: MRSA bacteremia with MV endocarditis -s/p line holiday, new TDC placed by IR 10/18 -on vanc and cefepime -per primary service and CTS-high risk for surgery, may need transfer to higher level of care to consider surgery   Acute ischemic CVA/septic infarct 10/17 -neuro signed off, per primary service  AHRF -possibly secondary to aspiration. Intubated 10/21, extubated 10/24 -per PCCM  Shock -pressor support per primary serivce  ESRD -typically on HD MWF - started CRRT 10/22 - UFG: is down 10kg and 10kg under dry wt --> changed to keep +50 cc/hr but unable to keep him + -pre and post -A/C: citrate protocol started 10/27 - will STOP CRRT today. CRRT holiday, hoping to get volume up with CRRT off  Dialysis access - Per IR note, occluded both right and left subclavian on venography therefore successful placement of left femoral TDC with the tip of the catheter in the infrarenal IVC.  BP/ vol - as  above, suspect he is dry. Plan as above. Also ^NS to 65 cc/hr.   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 Transfuse PRN for hgb <7. On ESA, cont darbe 40 mcg IV weekly.   CKD MBD-  calcitriol per tube. Resume sensipar when taking PO but will see how is calcium is doing first. Replete PO4 IV PRN, holding binders given hypophos.   Thrombocytopenia -w/u per primary. Secondary to HIT vs sepsis? IV heparin thru circuit was dc'd and citrate protocol started 10/27.   Vinson Moselle  MD  [facility] 07/28/2023, 1:52 PM  Recent Labs  Lab 07/27/23 1627 07/27/23 1628 07/28/23 0001 07/28/23 0356 07/28/23 0823 07/28/23 0829  HGB  --    < > 10.2*  --  10.9* 10.5*  ALBUMIN 1.9*  --   --  1.9*  --   --   CALCIUM 9.7  --   --  9.7  --   --   PHOS 2.0*  --   --  2.5  --   --   CREATININE 1.66*   < > 1.10 1.64*  --   --   K 3.8   < > 3.8 4.3 4.0 4.0   < > = values in this interval not displayed.    Inpatient medications:  sodium chloride   Intravenous Once   calcitRIOL  1.5 mcg Per Tube Q M,W,F   Chlorhexidine Gluconate Cloth  6 each Topical Q0600   clonazePAM  0.5 mg Oral BID   darbepoetin (ARANESP) injection - DIALYSIS  40 mcg Subcutaneous Q Fri-1800   feeding supplement (PROSource TF20)  60 mL Per Tube QID   fiber supplement (BANATROL TF)  60 mL Per Tube BID   gabapentin  100 mg Per Tube Q8H   insulin aspart  2 Units Subcutaneous Q4H   insulin aspart  2-6 Units Subcutaneous Q4H   insulin detemir  6 Units Subcutaneous Q12H   midodrine  15 mg Per Tube Q8H   multivitamin  1 tablet Per Tube BID   mouth rinse  15 mL Mouth Rinse 4 times per day   QUEtiapine  100 mg Per Tube BID    sodium chloride 10 mL/hr at 07/28/23 0900   feeding supplement (OSMOLITE 1.5 CAL) 55 mL/hr at 07/28/23 0900   norepinephrine (LEVOPHED) Adult infusion 10 mcg/min (07/28/23 1137)   vancomycin 750 mg (07/28/23 1012)   acetaminophen, acetaminophen, albuterol, mouth rinse, oxyCODONE

## 2023-07-28 NOTE — Progress Notes (Signed)
eLink Physician-Brief Progress Note Patient Name: [name] DOB: [birth year] MRN: [id number]   Date of Service  07/28/2023  HPI/Events of Note  ABG and CXR reviewed, patient received Seroquel, Klonopin and Gabapentin tonight, which may account for some of the somnolence.  eICU Interventions  PCCM ground crew requested to assess patient at bedside. CMP and Ammonia level added to labs, recent lactic acid was within normal limits.        [name] [name] 07/28/2023, 11:17 PM

## 2023-07-28 NOTE — Progress Notes (Addendum)
eLink Physician-Brief Progress Note Patient Name: [name] DOB: 03-18-1981 MRN: [id number]   Date of Service  07/28/2023  HPI/Events of Note  Patient with tachypnea and somnolence.  eICU Interventions  Stat CXR and ABG ordered.         [name] [name] 07/28/2023, 10:15 PM

## 2023-07-29 DIAGNOSIS — I129 Hypertensive chronic kidney disease with stage 1 through stage 4 chronic kidney disease, or unspecified chronic kidney disease: Secondary | ICD-10-CM | POA: Diagnosis not present

## 2023-07-29 DIAGNOSIS — N186 End stage renal disease: Secondary | ICD-10-CM | POA: Diagnosis not present

## 2023-07-29 DIAGNOSIS — Z992 Dependence on renal dialysis: Secondary | ICD-10-CM | POA: Diagnosis not present

## 2023-07-29 LAB — CBC
HCT: 26.9 % — ABNORMAL LOW (ref 39.0–52.0)
Hemoglobin: 8 g/dL — ABNORMAL LOW (ref 13.0–17.0)
MCH: 29.7 pg (ref 26.0–34.0)
MCHC: 29.7 g/dL — ABNORMAL LOW (ref 30.0–36.0)
MCV: 100 fL (ref 80.0–100.0)
Platelets: 211 10*3/uL (ref 150–400)
RBC: 2.69 MIL/uL — ABNORMAL LOW (ref 4.22–5.81)
RDW: 17.9 % — ABNORMAL HIGH (ref 11.5–15.5)
WBC: 20.7 10*3/uL — ABNORMAL HIGH (ref 4.0–10.5)
nRBC: 0 % (ref 0.0–0.2)

## 2023-07-29 LAB — POCT I-STAT 7, (LYTES, BLD GAS, ICA,H+H)
Acid-Base Excess: 9 mmol/L — ABNORMAL HIGH (ref 0.0–2.0)
Bicarbonate: 33.5 mmol/L — ABNORMAL HIGH (ref 20.0–28.0)
Calcium, Ion: 1.19 mmol/L (ref 1.15–1.40)
HCT: 26 % — ABNORMAL LOW (ref 39.0–52.0)
Hemoglobin: 8.8 g/dL — ABNORMAL LOW (ref 13.0–17.0)
O2 Saturation: 99 %
Patient temperature: 101.5
Potassium: 5.8 mmol/L — ABNORMAL HIGH (ref 3.5–5.1)
Sodium: 135 mmol/L (ref 135–145)
TCO2: 35 mmol/L — ABNORMAL HIGH (ref 22–32)
pCO2 arterial: 46.5 mm[Hg] (ref 32–48)
pH, Arterial: 7.471 — ABNORMAL HIGH (ref 7.35–7.45)
pO2, Arterial: 150 mm[Hg] — ABNORMAL HIGH (ref 83–108)

## 2023-07-29 LAB — GLUCOSE, CAPILLARY
Glucose-Capillary: 177 mg/dL — ABNORMAL HIGH (ref 70–99)
Glucose-Capillary: 118 mg/dL — ABNORMAL HIGH (ref 70–99)
Glucose-Capillary: 56 mg/dL — ABNORMAL LOW (ref 70–99)
Glucose-Capillary: 57 mg/dL — ABNORMAL LOW (ref 70–99)
Glucose-Capillary: 189 mg/dL — ABNORMAL HIGH (ref 70–99)
Glucose-Capillary: 174 mg/dL — ABNORMAL HIGH (ref 70–99)
Glucose-Capillary: 12 mg/dL — CL (ref 70–99)
Glucose-Capillary: 87 mg/dL (ref 70–99)
Glucose-Capillary: 150 mg/dL — ABNORMAL HIGH (ref 70–99)
Glucose-Capillary: 150 mg/dL — ABNORMAL HIGH (ref 70–99)
Glucose-Capillary: 277 mg/dL — ABNORMAL HIGH (ref 70–99)

## 2023-07-29 LAB — BASIC METABOLIC PANEL
Anion gap: 10 (ref 5–15)
BUN: 53 mg/dL — ABNORMAL HIGH (ref 6–20)
CO2: 34 mmol/L — ABNORMAL HIGH (ref 22–32)
Calcium: 9.1 mg/dL (ref 8.9–10.3)
Chloride: 92 mmol/L — ABNORMAL LOW (ref 98–111)
Creatinine, Ser: 3.29 mg/dL — ABNORMAL HIGH (ref 0.61–1.24)
GFR, Estimated: 23 mL/min — ABNORMAL LOW (ref >60)
Glucose, Bld: 131 mg/dL — ABNORMAL HIGH (ref 70–99)
Potassium: 5.7 mmol/L — ABNORMAL HIGH (ref 3.5–5.1)
Sodium: 136 mmol/L (ref 135–145)

## 2023-07-29 LAB — CALCIUM, IONIZED: Calcium, Ionized, Serum: 4.6 mg/dL (ref 4.5–5.6)

## 2023-07-29 LAB — CG4 I-STAT (LACTIC ACID): Lactic Acid, Venous: 1.6 mmol/L (ref 0.5–1.9)

## 2023-07-29 MED ORDER — SODIUM ZIRCONIUM CYCLOSILICATE 10 G PO PACK
10.0000 g | PACK | Freq: Three times a day (TID) | ORAL | Status: AC
  Administered 2023-07-29 – 2023-07-30 (×6): 10 g via ORAL
  Filled 2023-07-29 (×6): qty 1

## 2023-07-29 MED ORDER — DEXTROSE 50 % IV SOLN
INTRAVENOUS | Status: AC
  Administered 2023-07-29: 50 mL
  Filled 2023-07-29: qty 50

## 2023-07-29 MED ORDER — INSULIN DETEMIR 100 UNIT/ML ~~LOC~~ SOLN
4.0000 [IU] | Freq: Two times a day (BID) | SUBCUTANEOUS | Status: DC
  Administered 2023-07-29: 4 [IU] via SUBCUTANEOUS
  Filled 2023-07-29 (×3): qty 0.04

## 2023-07-29 MED ORDER — DEXTROSE 50 % IV SOLN: 1.0000 | Freq: Once | INTRAVENOUS | Status: AC

## 2023-07-29 MED ORDER — DEXTROSE 50 % IV SOLN
25.0000 mL | Freq: Once | INTRAVENOUS | Status: AC
  Administered 2023-07-29: 25 mL via INTRAVENOUS

## 2023-07-29 MED ORDER — QUETIAPINE FUMARATE 50 MG PO TABS
50.0000 mg | ORAL_TABLET | Freq: Every day | ORAL | Status: DC
  Administered 2023-07-30: 50 mg via ORAL
  Filled 2023-07-29: qty 1

## 2023-07-29 MED ORDER — VANCOMYCIN VARIABLE DOSE PER UNSTABLE RENAL FUNCTION (PHARMACIST DOSING): Status: DC

## 2023-07-29 MED ORDER — SODIUM CHLORIDE 0.9 % IV SOLN: INTRAVENOUS | Status: DC

## 2023-07-29 MED ORDER — DEXTROSE 50 % IV SOLN
INTRAVENOUS | Status: AC
  Filled 2023-07-29: qty 50

## 2023-07-29 MED ORDER — QUETIAPINE FUMARATE 100 MG PO TABS
100.0000 mg | ORAL_TABLET | Freq: Every day | ORAL | Status: DC
  Administered 2023-07-29 – 2023-08-01 (×4): 100 mg
  Filled 2023-07-29 (×4): qty 1

## 2023-07-29 NOTE — Progress Notes (Signed)
Pharmacy Antibiotic Note  [name] is a 42 y.o. 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 Pharmacy has been consulted for Vancomycin dosing.  The patient's CRRT was held on 10/30 - will hold the patient's Vancomycin dosing pending plans to resume CRRT vs start IHD.  Plan: - No standing Vancomycin for now - Will follow-up on plans to resume CRRT vs attempt IHD  Height: 5\' 7"  (170.2 cm) Weight: 61.9 kg (136 lb 7.4 oz) IBW/kg (Calculated) : 66.1  Temp (24hrs), Avg:98.9 F (37.2 C), Min:98.1 F (36.7 C), Max:99.6 F (37.6 C)  Recent Labs  Lab 07/23/23 1803 07/23/23 2047 07/23/23 2211 07/24/23 0406 07/24/23 0407 07/24/23 0839 07/24/23 1342 07/24/23 1539 07/25/23 0403 07/25/23 1215 07/26/23 0355 07/26/23 0401 07/26/23 0945 07/26/23 1211 07/26/23 1359 07/26/23 1503 07/27/23 0920 07/27/23 1627 07/27/23 1628 07/27/23 2359 07/28/23 0001 07/28/23 0356 07/29/23 0609  WBC  --  33.2*  --   --    < >  --  16.0*  --  13.8*  --  11.6*  --   --   --  15.0*  --   --   --   --   --   --   --  20.7*  CREATININE  --   --   --   --    < >  --   --    < > 1.61*   < > 1.76*   < >  --    < >  --    < >  --    < > 1.50* 1.50* 1.10 1.64* 3.29*  LATICACIDVEN 2.0* 2.5* 3.7* 0.8  --  0.7  --   --   --   --   --   --   --   --   --   --   --   --   --   --   --   --   --   VANCOTROUGH  --   --   --   --   --   --   --   --   --   --   --   --  1*  --   --   --   --   --   --   --   --   --   --   VANCORANDOM  --   --   --   --   --   --   --   --   --   --   --   --   --   --   --   --  17  --   --   --   --   --   --    < > = values in this interval not displayed.    Estimated Creatinine Clearance: 25.3 mL/min (A) (by C-G formula based on SCr of 3.29 mg/dL (H)).    No Known Allergies  Antimicrobials this admission: Vancomycin 10/12 >> Cefepime 10/21 >> 10/23; restart 10/25 >> 10/29  Microbiology results: 10/12 MRSA PCR >>  positive 10/12 CDiff antigen +, toxin -, PCR - >> negative 10/12 BCx >> 4/4 MRSA 10/13 BCx >> NGf 10/17 BCx >> NGf 10/21 RCx >> NOF  Thank you for allowing pharmacy to be a part of this patient's care.  Georgina Pillion, PharmD, BCPS, BCIDP Infectious Diseases Clinical Pharmacist 07/29/2023 8:13 AM   **Pharmacist phone  directory can now be found on [email or web address] ([email or web address]).  Listed under [facility].

## 2023-07-29 NOTE — Progress Notes (Signed)
PCCM progress note  [facility] transfer center again contacted for potential transfer for ongoing cardiothoracic surgery support, unfortunately transfer again declined due to capacity. Mom updated  At this time we will continue current plan of care with hope of ongoing stabilization and potential reevaluation per CT surgery at this facility versus repeat engagement for potential [facility] transfer early next week  Damiah Mcdonald D. Harris, NP-C [facility] Personal contact information can be found on Amion  If no contact or response made please call [phone] 07/29/2023, 5:02 PM

## 2023-07-29 NOTE — Progress Notes (Signed)
Speech Language Pathology Treatment: Dysphagia  Patient Details Name: [name] MRN: [id number] DOB: 12-10-1980 Today's Date: 07/29/2023 Time: 7846-9629 SLP Time Calculation (min) (ACUTE ONLY): 16 min  Assessment / Plan / Recommendation Clinical Impression  Mr. Starck was much more alert, interactive, and very talkative this afternoon. Quality and volume of voice were improved. He demonstrated improved mastication/control of ice chips, swallowing them without coughing.  Thin liquids elicited intermittent cough, suggesting aspiration, but less frequently. He may be ready for an instrumental swallow study in the next few days. He continued to require cues for attention and f/c, but overall his mentation was better today. SLP will follow.   HPI HPI: Pt is a 42 yo male presenting 10/11 with AMS. His dialysis catheter was partially removed and he was in DKA. Admitted for septic shock from MRSA bacteremia; infective endocarditis. LUE weakness was noted 10/17 and MRI confirmed sizable infarct in the R MCA branch as well as small acute infarcts in the L occipital lobe and bilateral cerebellum. Pt initially evaluated by SLP 10/18 and recommended to have Dys 3 diet and thin liquids due primarily oral symptoms. Pt had worsening hypoxia and AMS after vomiting on 10/21 requiring intubation 10/21-10/24. 10/22-10/30 CRRT. PMH includes: ESRD, DM, gastroparesis, L AKA, HTN, recent admission in July for sepsis secondary to R diabetic foot ulcer (left AMA)      SLP Plan  Continue with current plan of care      Recommendations for follow up therapy are one component of a multi-disciplinary discharge planning process, led by the attending physician.  Recommendations may be updated based on patient status, additional functional criteria and insurance authorization.    Recommendations  Diet recommendations: NPO Medication Administration: Via alternative means                  Oral care QID   Frequent  or constant Supervision/Assistance Dysphagia, oropharyngeal phase (R13.12)     Continue with current plan of care    Herlinda Heady L. Samson Frederic, MA CCC/SLP Clinical Specialist - Acute Care SLP Acute Rehabilitation Services Office number [phone]  [name]  07/29/2023, 3:31 PM

## 2023-07-29 NOTE — Progress Notes (Signed)
Ongoing struggles with multisystem challenges Mitral valve endocarditis Septic emboli causing stroke Left-sided hemiparesis Left AKA End-stage renal disease Dysphagia  No change in cardiac recommendations. High ongoing morbidity/mortality with extremely high potential surgical morbidity/mortality.  Donato Schultz, MD

## 2023-07-29 NOTE — Progress Notes (Signed)
[name] PROGRESS NOTE  Subjective:  Seen and examined in ICU.  Net I/O yesterday: +1.6 L yesterday Pressors: levo at 1 micrograms/min Discussed w/ ICU RN, pharm   Objective Vital signs in last 24 hours: Vitals:   07/26/23 1000 07/26/23 1015 07/26/23 1030 07/26/23 1045  BP:      Pulse: 99 100 97 100  Resp: (!) 24 (!) 25 (!) 21 (!) 24  Temp:      TempSrc:      SpO2: 100% 98% 99% 99%  Weight:      Height:       Physical Exam: General: ill appearing, able to speak, some L facial droop Heart:RRR, +systolic murmur Lungs: b/l chest expansion, normal wob Abdomen:soft, Non-tender, non-distended Extremities:No edema, left aka Neuro: awake, following some commands Dialysis Access: left femoral TDC  OP HD: MWF [facility] 4h  400/1.5    77kg   2/3 bath  [facility] TDC    Heparin none - last OP HD 10/7, post wt 74.6kg - coming off 75- 76kg - misses HD once every other week approx - rocaltrol 1.50 mcg  - no esa  CXR - 10/23 - mostly clear, poss vasc congestion  Assessment/ Plan: MRSA bacteremia with MV endocarditis -s/p line holiday, new TDC placed by IR 10/18 -on vanc and cefepime -per primary service and CTS-high risk for surgery, may need transfer to higher level of care   Acute ischemic CVA/septic infarct 10/17 -neuro signed off, per primary service  AHRF -possibly secondary to aspiration. Intubated 10/21, extubated 10/24 -per PCCM  Shock/ volume:  -pressor support seems to be resolving w/ volume repletion. Levo down to 1 micrograms/min. Resume NS 0.9% at 65 cc/hr.   ESRD -typically on HD MWF - started CRRT 10/22 --> stopped 10/30 - hopefully can get him off pressors and do regular dialysis Fri or Sat  Dialysis access - Per IR note, occluded both right and left subclavian on venography therefore successful placement of left femoral TDC with the tip of the catheter in the infrarenal IVC.  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 Transfuse  PRN for hgb <7. On ESA, cont darbe 40 mcg IV weekly.   CKD MBD-  calcitriol per tube. Resume sensipar when taking PO but will see how is calcium is doing first. Replete PO4 IV PRN, holding binders given hypophos.     Shane Moselle  MD  [facility] 07/29/2023, 11:17 AM  Recent Labs  Lab 07/27/23 1627 07/27/23 1628 07/28/23 0356 07/28/23 0823 07/28/23 2224 07/29/23 0609  HGB  --    < >  --    < > 10.2* 8.0*  ALBUMIN 1.9*  --  1.9*  --   --   --   CALCIUM 9.7  --  9.7  --   --  9.1  PHOS 2.0*  --  2.5  --   --   --   CREATININE 1.66*   < > 1.64*  --   --  3.29*  K 3.8   < > 4.3   < > 5.2* 5.7*   < > = values in this interval not displayed.    Inpatient medications:  sodium chloride   Intravenous Once   calcitRIOL  1.5 mcg Per Tube Q M,W,F   Chlorhexidine Gluconate Cloth  6 each Topical Q0600   clonazePAM  0.5 mg Oral BID   darbepoetin (ARANESP) injection - DIALYSIS  40 mcg Subcutaneous Q Fri-1800   feeding supplement (PROSource TF20)  60 mL Per Tube BID   fiber supplement (BANATROL TF)  60 mL Per Tube BID   gabapentin  100 mg Per Tube Q8H   insulin aspart  2 Units Subcutaneous Q4H   insulin aspart  2-6 Units Subcutaneous Q4H   insulin detemir  6 Units Subcutaneous Q12H   midodrine  15 mg Per Tube Q8H   multivitamin  1 tablet Per Tube BID   nutrition supplement (JUVEN)  1 packet Per Tube BID BM   mouth rinse  15 mL Mouth Rinse 4 times per day   QUEtiapine  100 mg Per Tube QHS   [START ON 07/30/2023] QUEtiapine  50 mg Oral Q0600   sodium zirconium cyclosilicate  10 g Oral TID   vancomycin variable dose per unstable renal function (pharmacist dosing)   Does not apply See admin instructions    feeding supplement (OSMOLITE 1.5 CAL) 65 mL/hr at 07/28/23 2100   norepinephrine (LEVOPHED) Adult infusion 7 mcg/min (07/29/23 0410)   acetaminophen, acetaminophen, albuterol, mouth rinse, oxyCODONE

## 2023-07-29 NOTE — Evaluation (Signed)
Physical Therapy Re-Evaluation Patient Details Name: [name] MRN: [id number] DOB: 1981-01-08 Today's Date: 07/29/2023  History of Present Illness  Patient is a 42 y/o male admitted 07/09/23 with confusion found to be in septic shock found to have MRSA in blood cultures, in DKA, TEE positive for vegitation on multiple valves, MRI showing R MCA infarct.  Pt with resp failure on 10/21 resulting in intubation and transfer to 2H.  CRRT started 1022-stopped 10/30.  PMH positive for ESRD on HD, IDDM, gastroparesis, L AKA, HTN.  Clinical Impression  Pt still quite confused, perseverating on eating while staff has tried to make him aware of poor swallowing.  Emphasis today on assessing movement of LE's, assisting maximally to transition to EOB via R elbow.  Assist scooting to EOB then balancing at EOB with maximal assist while trying to redirect him to the task.        If plan is discharge home, recommend the following: Two people to help with walking and/or transfers;Assist for transportation;Supervision due to cognitive status;Two people to help with bathing/dressing/bathroom;Help with stairs or ramp for entrance   Can travel by private vehicle   No    Equipment Recommendations Other (comment) (TBD with progression)  Recommendations for Other Services       Functional Status Assessment Patient has had a recent decline in their functional status and demonstrates the ability to make significant improvements in function in a reasonable and predictable amount of time.     Precautions / Restrictions Precautions Precautions: Fall Precaution Comments: LAKA, L hemiparesis, now intubated      Mobility  Bed Mobility Overal bed mobility: Needs Assistance Bed Mobility: Supine to Sit, Sit to Supine     Supine to sit: Max assist Sit to supine: Max assist, +2 for physical assistance   General bed mobility comments: Assist pt up and forward with pivot off R elbow with maximal assist     Transfers                   General transfer comment: unsafe with 1 person assist    Ambulation/Gait               General Gait Details: unable  Stairs            Wheelchair Mobility     Tilt Bed    Modified Rankin (Stroke Patients Only) Modified Rankin (Stroke Patients Only) Pre-Morbid Rankin Score: Moderate disability Modified Rankin: Severe disability     Balance Overall balance assessment: Needs assistance Sitting-balance support: Feet supported Sitting balance-[name]: Zero Sitting balance - Comments: pt unable to sit up without external support. even with use of the R UE.  Generally mod/max assist Postural control: Posterior lean, Right lateral lean     Standing balance comment: NT                             Pertinent Vitals/Pain Pain Assessment Pain Assessment: Faces Faces Pain Scale: Hurts a little bit Pain Location: general ?stomach/back Pain Intervention(s): Monitored during session    Home Living Family/patient expects to be discharged to:: Private residence Living Arrangements: Parent Available Help at Discharge: Family Type of Home: House Home Access: Ramped entrance       Home Layout: One level Home Equipment: Wheelchair - Nurse, learning disability (2 wheels)      Prior Function Prior Level of Function : Needs assist;History of Falls (last six months)  Mobility Comments: Mom reports he can transfer on his own with RW       Extremity/Trunk Assessment   Upper Extremity Assessment Upper Extremity Assessment: LUE deficits/detail LUE Deficits / Details: NT formally, L UE hanging toward floor while sitting EOB, does not use for bed mobility LUE Coordination: decreased fine motor    Lower Extremity Assessment Lower Extremity Assessment: LLE deficits/detail LLE Deficits / Details: AKA LLE Coordination: decreased fine motor    Cervical / Trunk Assessment Cervical / Trunk  Assessment: Normal  Communication   Communication Communication: No apparent difficulties  Cognition Arousal: Alert Behavior During Therapy:  (mildly agitated) Overall Cognitive Status: Impaired/Different from baseline Area of Impairment: Memory, Safety/judgement, Awareness, Problem solving                   Current Attention Level: Sustained Memory: Decreased short-term memory Following Commands: Follows one step commands with increased time, Follows one step commands inconsistently Safety/Judgement: Decreased awareness of deficits, Decreased awareness of safety Awareness: Intellectual Problem Solving: Slow processing, Decreased initiation, Difficulty sequencing, Requires verbal cues, Requires tactile cues General Comments: pt was perseverating on wanting/needing water, grill cheeses.  the number of each increasing each ask.  Pt not taking "unable" for an answer and gettin more agitated.        General Comments      Exercises     Assessment/Plan    PT Assessment Patient needs continued PT services  PT Problem List Decreased strength;Decreased activity tolerance;Decreased balance;Decreased mobility;Decreased coordination;Decreased cognition;Impaired sensation       PT Treatment Interventions DME instruction;Functional mobility training;Balance training;Patient/family education;Therapeutic activities;Neuromuscular re-education;Therapeutic exercise;Cognitive remediation;Wheelchair mobility training    PT Goals (Current goals can be found in the Care Plan section)  Acute Rehab PT Goals Patient Stated Goal: none stated PT Goal Formulation: Patient unable to participate in goal setting Time For Goal Achievement: 08/13/23 Potential to Achieve Goals: Fair    Frequency Min 1X/week     Co-evaluation               AM-PAC PT "6 Clicks" Mobility  Outcome Measure Help needed turning from your back to your side while in a flat bed without using bedrails?: Total Help  needed moving from lying on your back to sitting on the side of a flat bed without using bedrails?: Total Help needed moving to and from a bed to a chair (including a wheelchair)?: Total Help needed standing up from a chair using your arms (e.g., wheelchair or bedside chair)?: Total Help needed to walk in hospital room?: Total Help needed climbing 3-5 steps with a railing? : Total 6 Click Score: 6    End of Session   Activity Tolerance: Patient tolerated treatment well;Patient limited by fatigue Patient left: in bed;with call bell/phone within reach;with bed alarm set;with restraints reapplied;with nursing/sitter in room Nurse Communication:  (perseverating on food and water.) PT Visit Diagnosis: Other abnormalities of gait and mobility (R26.89);Muscle weakness (generalized) (M62.81);Other symptoms and signs involving the nervous system (R29.898)    Time: 1610-9604 PT Time Calculation (min) (ACUTE ONLY): 40 min   Charges:   PT Evaluation $PT Re-evaluation: 1 Re-eval PT Treatments $Therapeutic Activity: 8-22 mins $Neuromuscular Re-education: 8-22 mins PT General Charges $$ ACUTE PT VISIT: 1 Visit         07/29/2023  Jacinto Halim., PT Acute Rehabilitation Services [phone]  (office)  Eliseo Gum Ieesha Abbasi 07/29/2023, 6:20 PM

## 2023-07-29 NOTE — Progress Notes (Signed)
NAME:  [name], MRN:  [id number], DOB:  1981-06-10, LOS: 19 ADMISSION DATE:  07/09/2023, CONSULTATION DATE:  07/10/2023 REFERRING MD:  Suezanne Jacquet, CHIEF COMPLAINT:  DKA, AMS   History of Present Illness:  Patient is a 42 yo M w/ pertinent PMH ESRD on HD MWF, IDDM, gastroparesis, left AKA, HTN presents to [facility] on 10/11 w/ AMS.   Patient recently admitted to [facility] on 7/12 sepsis from right diabetic foot ulcer. Cultures w/ MRSA. Patient had dialysis catheter removed and replaced. ID treated w/ vanc. Patient refused echo and left on 7/17 against medical advise.    On 10/11 patient was found by family member on ground altered. EMS arrived and dialysis catheter partially removed. Patient more alert for them. Has been complaining of diarrhea x1 week per family. Unsure if patient went to dialysis today. Transferred to [facility] ED. On arrival bp stable 107/70 and afebrile. Patient remains confused and occasionally with agitation. Given dilaudid. CBG 497. Beta H >8. VBG ph 7.06, 16, 54, 4.7. Given iv fluids and started on insulin drip for DKA. CT head negative for acute abnormality. Cxr no significant findings. CT abd/pelvis no acute findings; possible steatosis vs. Liver mass 7 x 4 cm recommending f/u w/ MRI. WBC 20.7 and LA 2.4. Sepsis protocol initiated. Cultures obtained and started on vanc/zosyn. Given ams, dka, and soft bp pccm consulted for icu admission.   10/21 pccm called overnight to assess for worsening hypoxia and AMS after vomiting. On NRB w/ sats 70-80s. Hypoglycemic given dextrose which improved his mental status some. Transferred to 2H icu for intubation.     Pertinent  Medical History  ESRD on HD, HTN, diabetes, diabetic gastroparesis, cellulitis of left foot, left AKA  Significant Hospital Events: Including procedures, antibiotic start and stop dates in addition to other pertinent events   10/12: altered mental status, DKA 10/12: blood cultures positive for MRSA 10/13 TTE LVEF 25-30% w/  severely depressed LVF, global hypokinesis, gd I diastolic dysfxn, mod RV fxn reduction. Possible filamentous structure on MV 10/16 TEE LVEF 30-35%RV fxn nml. Large mass (2.00 cm x 0.88 cm ) on the posterior mitral leaflet which appeared to be extending in the mitral annulus. There are two mobile masses visible on the anterior leaflet of the tricupid valve which also appears to have a perforation. Another mobile  mass ( 0.68 cm x 0.67 cm) on the posterior tricupid leaflet. Moderate Tricuspid regurgitation which appears to  be through the leaflet perforation  10/16 seen by Cardiothoracic surg. Felt poor candidate. Recommended 6 weeks abx and recommended repeat TEE 1 week 10/17 noting left arm weakness. Not clear when started.  MRI showed Right MCA infarct w/ acute infarcts in left occipital and bilateral cerebellum felt c/w septic emboli. Neuro consulted.  10/18 tunneled left femoral vasc cath placed after catheter holiday  10/20 palliative care consulted later that day worsening hypoxia. Sats 70s. After vomiting. More lethargic  10/21: resp failure requiring intubation shortly after midnight  10/22- TEE 10/22 with EF 35%, LV global hypokinesis. Very large vegetation lateral scallop of posterior mitral leaflet 3.9cm length 1 cm width, highly mobile, prolapsing into LV. No large vegetation seen on TV.  Later that afternoon and abrupt hemodynamic changes requiring escalation of vasopressor support.  Felt given the size of his vegetation and increased bulk of the vegetation infections unlikely to clear.  He was felt to be decompensation may have due to of breath leaflet perforation and acute mitral valve regurgb 10/23: per nursing less  responsive than yesterday, increased ETT secretions. Clotting off CRRT. Ct head showing expected evolution of the right MCA stroke.  Cardiothoracic surgery re-consulted for worsening MVE. Felt too high risk here. Recommended referral to tertiary care  10/24 agitated when  sedation decreased, is purposeful but does not follow commands. Tolerating UF gaols of -150. Still on NE infusion but extubated later that afternoon after passing SBT 10/25 failed swallow eval still on just 3 lpm O2. Encephalopathic. Added seroquel in effort to get him off the precedex. Cardiology told no beds at [facility]. Not clear if accepted or not. Worsening pressor requirements. Got blood for hgb 6. Cefepime resumed for clinical decline and leukocytosis  10/26 hemoglobin again dropped to 6.3 overnight with increased pressor requirement yesterday afternoon now on vaso and levo. 10/27 hypoglycemic episode overnight, mental status improved a little over weekend  10/28 getting another unit of blood more awake.  Oriented x 2.  Still on Precedex.  Having liquid stools. 10/30 off Precedex, low-dose levo.  Plan to transition to IHD 10/31 seen by overnight team for hypoxia and somnolence ABG with mild hypercapnia  Interim History / Subjective:  More alert and oriented this a.m., underlying encephalopathy  Objective   Blood pressure (!) 136/99, pulse (!) 114, temperature 98.1 F (36.7 C), temperature source Axillary, resp. rate (!) 31, height 5\' 7"  (1.702 m), weight 61.9 kg, SpO2 100%.        Intake/Output Summary (Last 24 hours) at 07/29/2023 0910 Last data filed at 07/28/2023 2100 Gross per 24 hour  Intake 1452.08 ml  Output 61.5 ml  Net 1390.58 ml   Filed Weights   07/27/23 0500 07/28/23 0500 07/29/23 0500  Weight: 65.9 kg 61.4 kg 61.9 kg    Examination: General: Acute on chronic ill-appearing adult male lying in bed in no acute distress HEENT: [place]/AT, MM pink/moist, PERRL,  Neuro: Alert and oriented x 1, nonfocal CV: s1s2 regular rate and rhythm, no murmur, rubs, or gallops,  PULM: Diminished bilaterally, no increased work of breathing, no added breath sounds GI: soft, bowel sounds active in all 4 quadrants, non-tender, non-distended, tolerating TF Extremities: warm/dry, no edema   Skin: no rashes or lesions   Resolved Hospital Problem list   DKA  Septic shock  Thrombocytopenia  Assessment & Plan:  Acute hypoxic respiratory failure; due to aspiration PNA, evolving cavitary PNA and mucous plugging  -cxr has been stable.  -LUL cavitary lesion unchanged.  Suspect this is embolic from tricuspid valve involvement -Extubated 10/24 P: Continue supplemental oxygen for sat goal greater than 92 Aspiration precautions Continue IV antibiotics as below Mobilize as able Consider repeat CT scan  On-going hypotension w/ Septic shock due to Mitral and tricuspid valve endocarditis w/ MRSA bacteremia  -Not clear to what extent renal related vasoplegia and valve component contributing. Did have clinical decline on 25th so added back GNR coverage. Since then his WBC ct has improved.  P: Remains on vancomycin with extended course Continue cefepime x 7 days Goal of euvolemia Repeat blood cultures remain negative to date Continue to wean pressors for SBP goal greater than 90  Combined systolic/diastolic CHF Cardiogenic shock  -He was deferred by cardiothoracic surgery here, they recommended tertiary care consultation.   P: Continuous telemetry  Strict intake and output Daily weight Volume removal per Alysis GDMT as able Will reach back out to [facility] for potential transfer today 10/31  Acute metabolic encephalopathy w/ agitated delirium  Acute Right MCA  stroke/septic infarct w/ left sided hemiparesis   -  Improved over weekend to point he was having conversation w/ family and watching programs on ipad. His exam however has not been consistent. Slow to respond to medical staff. At times seems to have expressive aphasia  P: Maintain neuroprotective measures Aspiration precautions Delirium precautions Decrease a.m. Seroquel to  Continue scheduled Klonopin  Dysphagia P; Continue tube feeds via core track SLP following N.p.o..  T2DM w/ hyperglycemia  -Changed to  insulin gtt 10/28 for better glycemic control P: Continue SSI, low-dose tube feed coverage and long-acting insulin CBG goal 140-180 CBG checks every 4  End-stage renal disease on hemodialysis (MWF at baseline) Has occluded bilateral [place] veins  -CRRT stopped 10/31 P: Continue dialysis per nephrology Transition to IHD for session planned 11/1  Anemia -On-going issues w/ anemia. No obvious bleeding. Some of this may be due to hemolysis from  CRRT and freq filter clotting. S/p 4 units total PRBC from 25th to 28th -He has been off [place] heparin since 25th as his appt has been supra therapeutic P: Transfuse per protocol Hemoglobin goal greater than 7 Monitor for signs of bleeding Continue SSI  Incidental finding of Liver steatosis vs. Liver mass -CT abd/pelvis showing 7 x 4 cm density in anterior liver  P: Outpatient follow-up Follow-up  Best Practice (right click and "Reselect all SmartList Selections" daily)   Diet/type: tubefeeds DVT prophylaxis: SCD GI prophylaxis: N/A Lines: Central line, Dialysis Catheter, Arterial Line, and yes and it is still needed Foley:  Yes, and it is still needed Code Status:  full code Last date of multidisciplinary goals of care discussion: Continue to update patient and family daily  Critical care time:  CRITICAL CARE Performed by: Chirstina Haan D. Harris   Total critical care time: 38 minutes  Critical care time was exclusive of separately billable procedures and treating other patients.  Critical care was necessary to treat or prevent imminent or life-threatening deterioration.  Critical care was time spent personally by me on the following activities: development of treatment plan with patient and/or surrogate as well as nursing, discussions with consultants, evaluation of patient's response to treatment, examination of patient, obtaining history from patient or surrogate, ordering and performing treatments and interventions, ordering and review of  laboratory studies, ordering and review of radiographic studies, pulse oximetry and re-evaluation of patient's condition.  Greyden Besecker D. Harris, NP-C [facility] Personal contact information can be found on Amion  If no contact or response made please call [phone] 07/29/2023, 9:10 AM

## 2023-07-29 NOTE — Inpatient Diabetes Management (Signed)
Inpatient Diabetes Program Recommendations  AACE/ADA: New Consensus Statement on Inpatient Glycemic Control (2015)  Target Ranges:  Prepandial:   less than 140 mg/dL      Peak postprandial:   less than 180 mg/dL (1-2 hours)      Critically ill patients:  140 - 180 mg/dL   Lab Results  Component Value Date   GLUCAP 174 (H) 07/29/2023   HGBA1C 8.9 (H) 07/10/2023    Review of Glycemic Control  Latest Reference Range & Units 07/29/23 03:39 07/29/23 04:07 07/29/23 09:06 07/29/23 12:27  Glucose-Capillary 70 - 99 mg/dL 56 (L) 161 (H) 096 (H) 174 (H)   Diabetes history: DM  Outpatient Diabetes medications:  Current orders for Inpatient glycemic control:  Levemir 6 units tid  Novolog 2-6 units q 4 hours Novolog 2 units q 4 hours  Inpatient Diabetes Program Recommendations:    May consider reducing Novolog correction to 1-3 units q 4 hours.   Thanks,  Beryl Meager, RN, BC-ADM Inpatient Diabetes Coordinator Pager [phone]  (8a-5p)

## 2023-07-30 ENCOUNTER — Inpatient Hospital Stay (HOSPITAL_COMMUNITY): Payer: Medicare HMO

## 2023-07-30 DIAGNOSIS — M79661 Pain in right lower leg: Secondary | ICD-10-CM | POA: Diagnosis not present

## 2023-07-30 DIAGNOSIS — I059 Rheumatic mitral valve disease, unspecified: Secondary | ICD-10-CM | POA: Diagnosis not present

## 2023-07-30 LAB — GLUCOSE, CAPILLARY
Glucose-Capillary: 108 mg/dL — ABNORMAL HIGH (ref 70–99)
Glucose-Capillary: 126 mg/dL — ABNORMAL HIGH (ref 70–99)
Glucose-Capillary: 173 mg/dL — ABNORMAL HIGH (ref 70–99)
Glucose-Capillary: 177 mg/dL — ABNORMAL HIGH (ref 70–99)
Glucose-Capillary: 204 mg/dL — ABNORMAL HIGH (ref 70–99)
Glucose-Capillary: 327 mg/dL — ABNORMAL HIGH (ref 70–99)
Glucose-Capillary: 357 mg/dL — ABNORMAL HIGH (ref 70–99)

## 2023-07-30 LAB — CBC
HCT: 25.8 % — ABNORMAL LOW (ref 39.0–52.0)
Hemoglobin: 7.6 g/dL — ABNORMAL LOW (ref 13.0–17.0)
MCH: 28.9 pg (ref 26.0–34.0)
MCHC: 29.5 g/dL — ABNORMAL LOW (ref 30.0–36.0)
MCV: 98.1 fL (ref 80.0–100.0)
Platelets: 239 10*3/uL (ref 150–400)
RBC: 2.63 MIL/uL — ABNORMAL LOW (ref 4.22–5.81)
RDW: 17.7 % — ABNORMAL HIGH (ref 11.5–15.5)
WBC: 16.4 10*3/uL — ABNORMAL HIGH (ref 4.0–10.5)
nRBC: 0 % (ref 0.0–0.2)

## 2023-07-30 LAB — BASIC METABOLIC PANEL
Anion gap: 13 (ref 5–15)
BUN: 81 mg/dL — ABNORMAL HIGH (ref 6–20)
CO2: 30 mmol/L (ref 22–32)
Calcium: 9.1 mg/dL (ref 8.9–10.3)
Chloride: 95 mmol/L — ABNORMAL LOW (ref 98–111)
Creatinine, Ser: 4.78 mg/dL — ABNORMAL HIGH (ref 0.61–1.24)
GFR, Estimated: 15 mL/min — ABNORMAL LOW (ref >60)
Glucose, Bld: 185 mg/dL — ABNORMAL HIGH (ref 70–99)
Potassium: 5.4 mmol/L — ABNORMAL HIGH (ref 3.5–5.1)
Sodium: 138 mmol/L (ref 135–145)

## 2023-07-30 LAB — VANCOMYCIN, RANDOM: Vancomycin Rm: 24 ug/mL

## 2023-07-30 LAB — APTT: aPTT: 32 s (ref 24–36)

## 2023-07-30 LAB — PHOSPHORUS: Phosphorus: 4.4 mg/dL (ref 2.5–4.6)

## 2023-07-30 MED ORDER — INSULIN ASPART 100 UNIT/ML IJ SOLN
0.0000 [IU] | INTRAMUSCULAR | Status: DC
Start: 2023-07-30 — End: 2023-08-02
  Administered 2023-07-30: 4 [IU] via SUBCUTANEOUS
  Administered 2023-07-30: 3 [IU] via SUBCUTANEOUS
  Administered 2023-07-30: 4 [IU] via SUBCUTANEOUS
  Administered 2023-07-30 – 2023-07-31 (×3): 7 [IU] via SUBCUTANEOUS
  Administered 2023-07-31 (×2): 4 [IU] via SUBCUTANEOUS
  Administered 2023-07-31: 7 [IU] via SUBCUTANEOUS
  Administered 2023-08-01: 4 [IU] via SUBCUTANEOUS
  Administered 2023-08-01: 7 [IU] via SUBCUTANEOUS
  Administered 2023-08-01: 3 [IU] via SUBCUTANEOUS
  Administered 2023-08-02: 4 [IU] via SUBCUTANEOUS
  Administered 2023-08-02: 3 [IU] via SUBCUTANEOUS
  Administered 2023-08-02: 4 [IU] via SUBCUTANEOUS

## 2023-07-30 MED ORDER — CHLORHEXIDINE GLUCONATE CLOTH 2 % EX PADS: 6.0000 | MEDICATED_PAD | Freq: Every day | CUTANEOUS | Status: DC

## 2023-07-30 MED ORDER — HEPARIN SODIUM (PORCINE) 1000 UNIT/ML DIALYSIS: 1000.0000 [IU] | INTRAMUSCULAR | Status: DC | PRN

## 2023-07-30 MED ORDER — QUETIAPINE FUMARATE 100 MG PO TABS
100.0000 mg | ORAL_TABLET | Freq: Every day | ORAL | Status: DC
  Administered 2023-07-31 – 2023-08-02 (×3): 100 mg via ORAL
  Filled 2023-07-30 (×3): qty 1

## 2023-07-30 MED ORDER — PENTAFLUOROPROP-TETRAFLUOROETH EX AERO: 1.0000 | INHALATION_SPRAY | CUTANEOUS | Status: DC | PRN

## 2023-07-30 MED ORDER — LIDOCAINE HCL (PF) 1 % IJ SOLN: 5.0000 mL | INTRAMUSCULAR | Status: DC | PRN

## 2023-07-30 MED ORDER — ANTICOAGULANT SODIUM CITRATE 4% (200MG/5ML) IV SOLN
5.0000 mL | Status: DC | PRN
  Filled 2023-07-30: qty 5

## 2023-07-30 MED ORDER — HEPARIN SODIUM (PORCINE) 1000 UNIT/ML IJ SOLN
INTRAMUSCULAR | Status: AC
  Filled 2023-07-30: qty 5

## 2023-07-30 MED ORDER — LIDOCAINE HCL URETHRAL/MUCOSAL 2 % EX GEL
1.0000 | Freq: Once | CUTANEOUS | Status: DC
  Filled 2023-07-30: qty 6

## 2023-07-30 MED ORDER — GABAPENTIN 250 MG/5ML PO SOLN
300.0000 mg | Freq: Every day | ORAL | Status: DC
  Administered 2023-07-31 – 2023-08-12 (×12): 300 mg
  Filled 2023-07-30 (×13): qty 6

## 2023-07-30 MED ORDER — QUETIAPINE FUMARATE 50 MG PO TABS
50.0000 mg | ORAL_TABLET | ORAL | Status: AC
  Administered 2023-07-30: 50 mg
  Filled 2023-07-30: qty 1

## 2023-07-30 MED ORDER — ALTEPLASE 2 MG IJ SOLR: 2.0000 mg | Freq: Once | INTRAMUSCULAR | Status: DC | PRN

## 2023-07-30 MED ORDER — HEPARIN SODIUM (PORCINE) 1000 UNIT/ML IJ SOLN
4600.0000 [IU] | Freq: Once | INTRAMUSCULAR | Status: AC
  Administered 2023-07-30: 4600 [IU]

## 2023-07-30 MED ORDER — VANCOMYCIN HCL 750 MG/150ML IV SOLN
750.0000 mg | INTRAVENOUS | Status: AC
  Administered 2023-07-30: 750 mg via INTRAVENOUS
  Filled 2023-07-30: qty 150

## 2023-07-30 MED ORDER — INSULIN ASPART 100 UNIT/ML IJ SOLN
10.0000 [IU] | Freq: Once | INTRAMUSCULAR | Status: AC
  Administered 2023-07-30: 10 [IU] via SUBCUTANEOUS

## 2023-07-30 MED ORDER — LIDOCAINE-PRILOCAINE 2.5-2.5 % EX CREA
1.0000 | TOPICAL_CREAM | CUTANEOUS | Status: DC | PRN
  Filled 2023-07-30: qty 5

## 2023-07-30 NOTE — Progress Notes (Signed)
eLink Physician-Brief Progress Note Patient Name: [name] DOB: [birth year] MRN: [id number]   Date of Service  07/30/2023  HPI/Events of Note  Hyperglycemia  eICU Interventions  Insulin   Hyperglycemia Received long acting an hour ago We will give extra 10 un novolog [place] for total of 12 U Continue to follow bed side blood glucose   Intervention Category Major Interventions: Hyperglycemia - active titration of insulin therapy  Massie Maroon 07/30/2023, 12:37 AM

## 2023-07-30 NOTE — Progress Notes (Signed)
Received patient in bed to unit.  Alert and oriented.  Informed consent signed and in chart.   TX duration:3.5 hours  Patient tolerated well.  Transported back to the room  Alert, without acute distress.  Hand-off given to patient's nurse.   Access used: Left Femoral HD Cath Access issues: A-v and V-A  Total UF removed: 0mL Medication(s) given: none   07/30/23 1930  Vitals  Temp 99 F (37.2 C)  Temp Source Oral  BP 125/66  Pulse Rate (!) 142  ECG Heart Rate (!) 142  Resp (!) 25  Oxygen Therapy  SpO2 95 %  O2 Device Nasal Cannula  O2 Flow Rate (L/min) 2 L/min  During Treatment Monitoring  Duration of HD Treatment -hour(s) 3.5 hour(s)  HD Safety Checks Performed Yes  Intra-Hemodialysis Comments Tx completed;Tolerated well  Dialysis Fluid Bolus Normal Saline  Bolus Amount (mL) 300 mL  Hemodialysis Catheter Left Femoral vein Double lumen Permanent (Tunneled)  Placement Date/Time: 07/16/23 1318   Serial / Lot #: [id number]  Expiration Date: 05/24/26  Time Out: Correct patient;Correct site;Correct procedure  Maximum sterile barrier precautions: Hand hygiene;Cap;Mask;Sterile gown;Sterile gloves;Large sterile ...  Site Condition No complications  Blue Lumen Status Flushed;Heparin locked;Dead end cap in place  Red Lumen Status Flushed;Heparin locked;Dead end cap in place  Purple Lumen Status N/A  Catheter fill solution Heparin 1000 units/ml  Catheter fill volume (Arterial) 2.3 cc  Catheter fill volume (Venous) 2.3  Dressing Type Transparent  Dressing Status Antimicrobial disc in place;Clean, Dry, Intact  Interventions  (deaccessed)  Drainage Description None  Dressing Change Due 08/02/23  Post treatment catheter status Capped and Clamped     Stacie Glaze LPN [facility]

## 2023-07-30 NOTE — Progress Notes (Signed)
Speech Language Pathology Treatment/FEES : Dysphagia  Patient Details Name: COLESON KANT MRN: [id number] DOB: [birth year] Today's Date: 07/30/2023 Time: [id number] SLP Time Calculation (min) (ACUTE ONLY): 34 min  Assessment / Plan / Recommendation Clinical Impression  Attempted bedside FEES this afternoon to evaluate swallowing. Mr. Tardif was more lethargic today; febrile.  Discussed plan with RN, pt's aunt at bedside who was present to assist with PO administration. Mr Burdo had difficulty tolerating placement of scope; extensive motion and head movements did not allow visualization of anatomy nor administration of POs. C/O N/V. Scope removed. Continue NPO; allow ice chips over the w/e. Discussed ongoing dysphagia and anticipation of improvement with his aunt;; she is supportive, verbalized understanding. D/W RN. Will follow.  HPI HPI: Pt is a 42 yo male presenting 10/11 with AMS. His dialysis catheter was partially removed and he was in DKA. Admitted for septic shock from MRSA bacteremia; infective endocarditis. LUE weakness was noted 10/17 and MRI confirmed sizable infarct in the R MCA branch as well as small acute infarcts in the L occipital lobe and bilateral cerebellum. Pt initially evaluated by SLP 10/18 and recommended to have Dys 3 diet and thin liquids due primarily oral symptoms. Pt had worsening hypoxia and AMS after vomiting on 10/21 requiring intubation 10/21-10/24. 10/22-10/30 CRRT. PMH includes: ESRD, DM, gastroparesis, L AKA, HTN, recent admission in July for sepsis secondary to R diabetic foot ulcer (left AMA)      SLP Plan  Continue with current plan of care      Recommendations for follow up therapy are one component of a multi-disciplinary discharge planning process, led by the attending physician.  Recommendations may be updated based on patient status, additional functional criteria and insurance authorization.    Recommendations  Diet recommendations: NPO (allow ice  chips) Medication Administration: Via alternative means                  Oral care QID   Frequent or constant Supervision/Assistance Dysphagia, oropharyngeal phase (R13.12)     Continue with current plan of care   Zipporah Finamore L. Samson Frederic, MA CCC/SLP Clinical Specialist - Acute Care SLP Acute Rehabilitation Services Office number [phone]   [name]  07/30/2023, 2:07 PM

## 2023-07-30 NOTE — Progress Notes (Signed)
Right lower extremity venous duplex has been completed. Preliminary results can be found in CV Proc through chart review.   07/30/23 10:30 AM Olen Cordial RVT

## 2023-07-30 NOTE — Plan of Care (Signed)
  Problem: Nutrition: Goal: Risk of aspiration will decrease Outcome: Progressing   Problem: Ischemic Stroke/TIA Tissue Perfusion: Goal: Complications of ischemic stroke/TIA will be minimized Outcome: Progressing   Problem: Education: Goal: Knowledge of disease or condition will improve Outcome: Not Progressing Goal: Knowledge of secondary prevention will improve (MUST DOCUMENT ALL) Outcome: Not Progressing Goal: Knowledge of patient specific risk factors will improve Loraine Leriche N/A or DELETE if not current risk factor) Outcome: Not Progressing   Problem: Coping: Goal: Will verbalize positive feelings about self Outcome: Not Progressing Goal: Will identify appropriate support needs Outcome: Not Progressing

## 2023-07-30 NOTE — Progress Notes (Signed)
Pharmacy Antibiotic Note  [name] is a 42 y.o. 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 Pharmacy has been consulted for Vancomycin dosing.  Patient to receive HD session today. Will administer vancomycin dose today following HD session and monitor tolerance for further scheduled dosing.   Plan: - Vancomycin 750 mg IV x1 dose today after HD  - No standing Vancomycin for now - Will monitor HD tolerance and plans for repeat sessions  Height: 5\' 7"  (170.2 cm) Weight: 70.4 kg (155 lb 3.3 oz) IBW/kg (Calculated) : 66.1  Temp (24hrs), Avg:99.4 F (37.4 C), Min:98.6 F (37 C), Max:101.5 F (38.6 C)  Recent Labs  Lab 07/23/23 2047 07/23/23 2211 07/24/23 0406 07/24/23 0407 07/24/23 0839 07/24/23 1342 07/25/23 0403 07/25/23 1215 07/26/23 0355 07/26/23 0401 07/26/23 0945 07/26/23 1211 07/26/23 1359 07/26/23 1503 07/27/23 0920 07/27/23 1627 07/27/23 2359 07/28/23 0001 07/28/23 0356 07/28/23 2253 07/29/23 0609 07/30/23 0406 07/30/23 1004  WBC 33.2*  --   --    < >  --    < > 13.8*  --  11.6*  --   --   --  15.0*  --   --   --   --   --   --   --  20.7* 16.4*  --   CREATININE  --   --   --    < >  --    < > 1.61*   < > 1.76*   < >  --    < >  --    < >  --    < > 1.50* 1.10 1.64*  --  3.29* 4.78*  --   LATICACIDVEN 2.5* 3.7* 0.8  --  0.7  --   --   --   --   --   --   --   --   --   --   --   --   --   --  1.6  --   --   --   VANCOTROUGH  --   --   --   --   --   --   --   --   --   --  18*  --   --   --   --   --   --   --   --   --   --   --   --   VANCORANDOM  --   --   --   --   --   --   --   --   --   --   --   --   --   --  17  --   --   --   --   --   --   --  24   < > = values in this interval not displayed.    Estimated Creatinine Clearance: 18.6 mL/min (A) (by C-G formula based on SCr of 4.78 mg/dL (H)).    No Known Allergies  Antimicrobials this admission: Vancomycin 10/12 >> Cefepime 10/21 >> 10/23; restart  10/25 >> 10/29  Microbiology results: 10/12 MRSA PCR >> positive 10/12 CDiff antigen +, toxin -, PCR - >> negative 10/12 BCx >> 4/4 MRSA 10/13 BCx >> NGf 10/17 BCx >> NGf 10/21 RCx >> NOF  Thank you for allowing pharmacy to be a part of this patient's care.  Lennie Muckle, PharmD PGY1 Pharmacy Resident 07/30/2023  1:57 PM

## 2023-07-30 NOTE — Progress Notes (Signed)
[facility] NEPHROLOGY PROGRESS NOTE  Subjective:  Seen and examined in ICU.  Net I/O yesterday: +1.5 L yesterday Pressors: levo @ 5-7 this am Discussed w/ ICU RN  Objective Vital signs in last 24 hours: Vitals:   07/26/23 1000 07/26/23 1015 07/26/23 1030 07/26/23 1045  BP:      Pulse: 99 100 97 100  Resp: (!) 24 (!) 25 (!) 21 (!) 24  Temp:      TempSrc:      SpO2: 100% 98% 99% 99%  Weight:      Height:       Physical Exam: General: ill appearing, able to speak, some L facial droop Heart:RRR, +systolic murmur Lungs: b/l chest expansion, normal wob Abdomen:soft, Non-tender, non-distended Extremities: 1+ bilat UE edema, left aka Neuro: awake, following some commands Dialysis Access: left femoral TDC  OP HD: MWF [facility] 4h  400/1.5    77kg   2/3 bath  [facility] TDC    Heparin none - last OP HD 10/7, post wt 74.6kg - coming off 75- 76kg - misses HD once every other week approx - rocaltrol 1.50 mcg  - no esa  CXR - 10/23 - mostly clear, poss vasc congestion  Assessment/ Plan: MRSA bacteremia with MV endocarditis -s/p line holiday, new TDC placed by IR 10/18 -on vanc and cefepime -per primary service and CTS-high risk for surgery  Acute ischemic CVA/septic infarct 10/17 -neuro signed off, per primary service  AHRF -possibly secondary to aspiration. Intubated 10/21, extubated 10/24 -per PCCM  Shock/ volume:  - continues to require levo support despite volume expansion. Now has some UE edema and wt's up to 70kg (dry wt 77kg). Will dc IVF's  ESRD -typically on HD MWF - started CRRT 10/22 --> stopped 10/30 - plan HD today, get K+ down, keep even  Dialysis access - Per IR note, occluded both right and left subclavian on venography therefore successful placement of left femoral TDC with the tip of the catheter in the infrarenal IVC.  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 Transfuse PRN for hgb <7. On ESA, cont darbe 40 mcg IV weekly.   CKD MBD-   CCa in range, phos slightly low. Cont calcitriol per tube. Holding sensipar as not swallowing well yet. No binders for now given low phos.     Vinson Moselle  MD  [facility] 07/30/2023, 10:49 AM  Recent Labs  Lab 07/27/23 1627 07/27/23 1628 07/28/23 0356 07/28/23 0823 07/29/23 0609 07/29/23 2253 07/30/23 0406  HGB  --    < >  --    < > 8.0* 8.8* 7.6*  ALBUMIN 1.9*  --  1.9*  --   --   --   --   CALCIUM 9.7  --  9.7  --  9.1  --  9.1  PHOS 2.0*  --  2.5  --   --   --   --   CREATININE 1.66*   < > 1.64*  --  3.29*  --  4.78*  K 3.8   < > 4.3   < > 5.7* 5.8* 5.4*   < > = values in this interval not displayed.    Inpatient medications:  sodium chloride   Intravenous Once   calcitRIOL  1.5 mcg Per Tube Q M,W,F   Chlorhexidine Gluconate Cloth  6 each Topical Q0600   clonazePAM  0.5 mg Oral BID   darbepoetin (ARANESP) injection - DIALYSIS  40 mcg Subcutaneous Q Fri-1800   feeding supplement (  PROSource TF20)  60 mL Per Tube BID   fiber supplement (BANATROL TF)  60 mL Per Tube BID   gabapentin  100 mg Per Tube Q8H   insulin aspart  0-20 Units Subcutaneous Q4H   lidocaine  1 Application Topical Once   midodrine  15 mg Per Tube Q8H   multivitamin  1 tablet Per Tube BID   nutrition supplement (JUVEN)  1 packet Per Tube BID BM   mouth rinse  15 mL Mouth Rinse 4 times per day   QUEtiapine  100 mg Per Tube QHS   [START ON 07/31/2023] QUEtiapine  100 mg Oral Q0600   sodium zirconium cyclosilicate  10 g Oral TID   vancomycin variable dose per unstable renal function (pharmacist dosing)   Does not apply See admin instructions    feeding supplement (OSMOLITE 1.5 CAL) 1,000 mL (07/30/23 1001)   norepinephrine (LEVOPHED) Adult infusion 7 mcg/min (07/30/23 0800)   acetaminophen, acetaminophen, albuterol, mouth rinse, oxyCODONE

## 2023-07-30 NOTE — Progress Notes (Signed)
eLink Physician-Brief Progress Note Patient Name: [name] DOB: Nov 22, 1980 MRN: [id number]   Date of Service  07/30/2023  HPI/Events of Note    eICU Interventions      Undergoing HD Tachycardia Appeared to be in Sinus Rate 138 SBP 139 Diastolic 70's -80 Low grade temp 99.8  Will continue to observe   Intervention Category Minor Interventions: Other:  Massie Maroon 07/30/2023, 8:19 PM

## 2023-07-30 NOTE — Progress Notes (Signed)
Unable to do FEEs Review of LEUS neg for DVT  Adding SCD to RLE No change in plan as outlined earlier this am

## 2023-07-30 NOTE — Progress Notes (Signed)
OT Cancellation Note  Patient Details Name: [name] MRN: [id number] DOB: 06-Oct-1980   Cancelled Treatment:    Reason Eval/Treat Not Completed: Medical issues which prohibited therapy.  Discussed status with RN, okay to hold and check back on Monday.  [name] D Estrellita Lasky 07/30/2023, 9:09 AM 07/30/2023  RP, OTR/L  Acute Rehabilitation Services  Office:  [phone]

## 2023-07-30 NOTE — Progress Notes (Signed)
NAME:  [name], MRN:  [id number], DOB:  [birth year], LOS: 20 ADMISSION DATE:  07/09/2023, CONSULTATION DATE:  07/10/2023 REFERRING MD:  Suezanne Jacquet, CHIEF COMPLAINT:  DKA, AMS   History of Present Illness:  Patient is a 42 yo M w/ pertinent PMH ESRD on HD MWF, IDDM, gastroparesis, left AKA, HTN presents to [facility] on 10/11 w/ AMS.   Patient recently admitted to [facility] on 7/12 sepsis from right diabetic foot ulcer. Cultures w/ MRSA. Patient had dialysis catheter removed and replaced. ID treated w/ vanc. Patient refused echo and left on 7/17 against medical advise.    On 10/11 patient was found by family member on ground altered. EMS arrived and dialysis catheter partially removed. Patient more alert for them. Has been complaining of diarrhea x1 week per family. Unsure if patient went to dialysis today. Transferred to [facility] ED. On arrival bp stable 107/70 and afebrile. Patient remains confused and occasionally with agitation. Given dilaudid. CBG 497. Beta H >8. VBG ph 7.06, 16, 54, 4.7. Given iv fluids and started on insulin drip for DKA. CT head negative for acute abnormality. Cxr no significant findings. CT abd/pelvis no acute findings; possible steatosis vs. Liver mass 7 x 4 cm recommending f/u w/ MRI. WBC 20.7 and LA 2.4. Sepsis protocol initiated. Cultures obtained and started on vanc/zosyn. Given ams, dka, and soft bp pccm consulted for icu admission.   10/21 pccm called overnight to assess for worsening hypoxia and AMS after vomiting. On NRB w/ sats 70-80s. Hypoglycemic given dextrose which improved his mental status some. Transferred to 2H icu for intubation.     Pertinent  Medical History  ESRD on HD, HTN, diabetes, diabetic gastroparesis, cellulitis of left foot, left AKA  Significant Hospital Events: Including procedures, antibiotic start and stop dates in addition to other pertinent events   10/12: altered mental status, DKA 10/12: blood cultures positive for MRSA 10/13 TTE LVEF 25-30% w/  severely depressed LVF, global hypokinesis, gd I diastolic dysfxn, mod RV fxn reduction. Possible filamentous structure on MV 10/16 TEE LVEF 30-35%RV fxn nml. Large mass (2.00 cm x 0.88 cm ) on the posterior mitral leaflet which appeared to be extending in the mitral annulus. There are two mobile masses visible on the anterior leaflet of the tricupid valve which also appears to have a perforation. Another mobile  mass ( 0.68 cm x 0.67 cm) on the posterior tricupid leaflet. Moderate Tricuspid regurgitation which appears to  be through the leaflet perforation  10/16 seen by Cardiothoracic surg. Felt poor candidate. Recommended 6 weeks abx and recommended repeat TEE 1 week 10/17 noting left arm weakness. Not clear when started.  MRI showed Right MCA infarct w/ acute infarcts in left occipital and bilateral cerebellum felt c/w septic emboli. Neuro consulted.  10/18 tunneled left femoral vasc cath placed after catheter holiday  10/20 palliative care consulted later that day worsening hypoxia. Sats 70s. After vomiting. More lethargic  10/21: resp failure requiring intubation shortly after midnight  10/22- TEE 10/22 with EF 35%, LV global hypokinesis. Very large vegetation lateral scallop of posterior mitral leaflet 3.9cm length 1 cm width, highly mobile, prolapsing into LV. No large vegetation seen on TV.  Later that afternoon and abrupt hemodynamic changes requiring escalation of vasopressor support.  Felt given the size of his vegetation and increased bulk of the vegetation infections unlikely to clear.  He was felt to be decompensation may have due to of breath leaflet perforation and acute mitral valve regurgb 10/23: per nursing less  responsive than yesterday, increased ETT secretions. Clotting off CRRT. Ct head showing expected evolution of the right MCA stroke.  Cardiothoracic surgery re-consulted for worsening MVE. Felt too high risk here. Recommended referral to tertiary care  10/24 agitated when  sedation decreased, is purposeful but does not follow commands. Tolerating UF gaols of -150. Still on NE infusion but extubated later that afternoon after passing SBT 10/25 failed swallow eval still on just 3 lpm O2. Encephalopathic. Added seroquel in effort to get him off the precedex. Cardiology told no beds at [facility]. Not clear if accepted or not. Worsening pressor requirements. Got blood for hgb 6. Cefepime resumed for clinical decline and leukocytosis  10/26 hemoglobin again dropped to 6.3 overnight with increased pressor requirement yesterday afternoon now on vaso and levo. 10/27 hypoglycemic episode overnight, mental status improved a little over weekend  10/28 getting another unit of blood more awake.  Oriented x 2.  Still on Precedex.  Having liquid stools. 10/30 off Precedex, low-dose levo.  Plan to transition to IHD 10/31 seen by overnight team for hypoxia and somnolence ABG with mild hypercapnia. Again declined by [facility] because they were at capacity. More awake. Doing better w/ Bed side swallow but still likely aspirating. CRRT stopped.  11/1 adjustments made for better glycemic control.  Much more awake but a little confused  Interim History / Subjective:  Pleasant, cooperative, interactive.  Objective   Blood pressure (!) 136/99, pulse (!) 136, temperature (!) 101.5 F (38.6 C), resp. rate (!) 29, height 5\' 7"  (1.702 m), weight 70.4 kg, SpO2 100%.        Intake/Output Summary (Last 24 hours) at 07/30/2023 0806 Last data filed at 07/30/2023 0600 Gross per 24 hour  Intake 2413.28 ml  Output 1370 ml  Net 1043.28 ml   Filed Weights   07/28/23 0500 07/29/23 0500 07/30/23 0600  Weight: 61.4 kg 61.9 kg 70.4 kg    Examination:  General 42 year old male patient laying in bed currently no acute distress he is remarkably more awake than earlier this week when I saw him HEENT normocephalic atraumatic continues to have left facial droop speech is much more clear Pulmonary: Clear to  auscultation no accessory use currently 2 L/min Cardiac: Tachycardic regular rhythm Abdomen: Soft nontender no organomegaly still having liquid stool Extremities left AKA.  The HD cath is unremarkable in that groin right leg is warm Neuro awake, oriented x 2.  Confused at times but speech much more fluent and quality much stronger.  Continues to have left-sided hemiparesis  Resolved Hospital Problem list   DKA  Septic shock  Thrombocytopenia  Assessment & Plan:   Acute hypoxic respiratory failure; due to aspiration PNA, evolving cavitary PNA and mucous plugging  -cxr has been stable.  -LUL cavitary lesion unchanged.  Suspect this is embolic from tricuspid valve involvement -Extubated 10/24 Plan Continue supplemental oxygen for sat goal greater than 92 Aspiration precautions Continue IV antibiotics as below Mobilize as able Consider repeat CT scan at 4-6 weeks  PRN CXR   On-going hypotension w/ Septic shock due to Mitral and tricuspid valve endocarditis w/ MRSA bacteremia  -Not clear to what extent renal related vasoplegia and valve component contributing. Did have clinical decline on 25th so added back GNR coverag and WBC improved. Still having intermittent temp. 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   Plan Continuous telemetry  Strict intake and output Daily weight Not candidate for GDMT given on-going shock  Will reach out to [facility] transfer again on Monday   Ongoing sinus tachycardia. -He is a little restless.  Also running low-grade temperatures.  But he remains high risk for thromboembolic  event Plan Continue to wean norepinephrine Treating pressors Checking PTT Will get ultrasound of right lower extremity to rule out DVT, if negative and initiate SCD on that leg  Acute metabolic encephalopathy w/ agitated delirium  Acute Right MCA  stroke/septic infarct w/ left sided hemiparesis   -Mental status continues to slowly improve.  Plan Cont delirium precautions Cont scheduled Seroquel 50mg  am/100 HS  Continue scheduled Klonopin Rx pain  Can get him OOB when not on iHD  End-stage renal disease on hemodialysis (MWF at baseline) Has occluded bilateral [place] veins  Mild hyperkalemia off CRRT -CRRT stopped 10/31 Plan  For  iHD today  Am chem   Dysphagia Plan Continue tube feeds via core track SLP following Plan N.p.o.. Objective study timing TBD by SLP  T2DM w/ hyperglycemia  Plan CBG goal 140-180 CBG checks every 4 Inc to resistant scale   Anemia -On-going issues w/ anemia. No obvious bleeding. Some of this may be due to hemolysis from  CRRT and freq filter clotting. S/p 4 units total PRBC from 25th to 28th -He has been off [place] heparin since 25th as his appt has been supra therapeutic Plan Transfuse per protocol Hgb goal > 7 Trend CBC SCDs  Incidental finding of Liver steatosis vs. Liver mass -CT abd/pelvis showing 7 x 4 cm density in anterior liver  Plan Outpatient follow-up Follow-up  Best Practice (right click and "Reselect all SmartList Selections" daily)   Diet/type: tubefeeds DVT prophylaxis: SCD GI prophylaxis: N/A Lines: Central line, Dialysis Catheter, Arterial Line, and yes and it is still needed Foley:  Yes, and it is still needed Code Status:  full code Last date of multidisciplinary goals of care discussion: Continue to update patient and family daily  Critical care time:  CRITICAL CARE Performed by: Shelby Mattocks

## 2023-07-31 DIAGNOSIS — J9601 Acute respiratory failure with hypoxia: Secondary | ICD-10-CM | POA: Diagnosis not present

## 2023-07-31 DIAGNOSIS — D62 Acute posthemorrhagic anemia: Secondary | ICD-10-CM | POA: Diagnosis not present

## 2023-07-31 DIAGNOSIS — I059 Rheumatic mitral valve disease, unspecified: Secondary | ICD-10-CM | POA: Diagnosis not present

## 2023-07-31 LAB — BASIC METABOLIC PANEL
Anion gap: 13 (ref 5–15)
BUN: 45 mg/dL — ABNORMAL HIGH (ref 6–20)
CO2: 26 mmol/L (ref 22–32)
Calcium: 8.3 mg/dL — ABNORMAL LOW (ref 8.9–10.3)
Chloride: 94 mmol/L — ABNORMAL LOW (ref 98–111)
Creatinine, Ser: 3.14 mg/dL — ABNORMAL HIGH (ref 0.61–1.24)
GFR, Estimated: 24 mL/min — ABNORMAL LOW (ref >60)
Glucose, Bld: 314 mg/dL — ABNORMAL HIGH (ref 70–99)
Potassium: 4.9 mmol/L (ref 3.5–5.1)
Sodium: 133 mmol/L — ABNORMAL LOW (ref 135–145)

## 2023-07-31 LAB — GLUCOSE, CAPILLARY
Glucose-Capillary: 109 mg/dL — ABNORMAL HIGH (ref 70–99)
Glucose-Capillary: 111 mg/dL — ABNORMAL HIGH (ref 70–99)
Glucose-Capillary: 187 mg/dL — ABNORMAL HIGH (ref 70–99)
Glucose-Capillary: 195 mg/dL — ABNORMAL HIGH (ref 70–99)
Glucose-Capillary: 202 mg/dL — ABNORMAL HIGH (ref 70–99)
Glucose-Capillary: 203 mg/dL — ABNORMAL HIGH (ref 70–99)
Glucose-Capillary: 229 mg/dL — ABNORMAL HIGH (ref 70–99)

## 2023-07-31 LAB — CBC
HCT: 26.2 % — ABNORMAL LOW (ref 39.0–52.0)
Hemoglobin: 7.8 g/dL — ABNORMAL LOW (ref 13.0–17.0)
MCH: 29.3 pg (ref 26.0–34.0)
MCHC: 29.8 g/dL — ABNORMAL LOW (ref 30.0–36.0)
MCV: 98.5 fL (ref 80.0–100.0)
Platelets: 229 10*3/uL (ref 150–400)
RBC: 2.66 MIL/uL — ABNORMAL LOW (ref 4.22–5.81)
RDW: 17.8 % — ABNORMAL HIGH (ref 11.5–15.5)
WBC: 17.1 10*3/uL — ABNORMAL HIGH (ref 4.0–10.5)
nRBC: 0 % (ref 0.0–0.2)

## 2023-07-31 MED ORDER — INSULIN GLARGINE-YFGN 100 UNIT/ML ~~LOC~~ SOLN
5.0000 [IU] | Freq: Two times a day (BID) | SUBCUTANEOUS | Status: DC
  Administered 2023-07-31 – 2023-08-01 (×3): 5 [IU] via SUBCUTANEOUS
  Filled 2023-07-31 (×4): qty 0.05

## 2023-07-31 MED ORDER — METOCLOPRAMIDE HCL 5 MG/ML IJ SOLN
10.0000 mg | Freq: Four times a day (QID) | INTRAMUSCULAR | Status: AC
  Administered 2023-07-31 – 2023-08-01 (×2): 10 mg via INTRAVENOUS
  Filled 2023-07-31 (×2): qty 2

## 2023-07-31 MED ORDER — GERHARDT'S BUTT CREAM
TOPICAL_CREAM | Freq: Four times a day (QID) | CUTANEOUS | Status: DC | PRN
  Filled 2023-07-31 (×2): qty 1

## 2023-07-31 MED ORDER — CLONAZEPAM 0.5 MG PO TABS
0.5000 mg | ORAL_TABLET | Freq: Two times a day (BID) | ORAL | Status: DC | PRN
  Administered 2023-07-31 – 2023-08-12 (×5): 0.5 mg via ORAL
  Filled 2023-07-31 (×5): qty 1

## 2023-07-31 MED ORDER — VANCOMYCIN HCL 750 MG/150ML IV SOLN
750.0000 mg | INTRAVENOUS | Status: DC
  Administered 2023-08-02 – 2023-08-18 (×8): 750 mg via INTRAVENOUS
  Filled 2023-07-31 (×12): qty 150

## 2023-07-31 NOTE — Progress Notes (Signed)
[name] NEPHROLOGY PROGRESS NOTE  Subjective:  Seen and examined in ICU.  Net I/O yesterday: +2.5 L yesterday Pressors: remains on levo today 8 ug/min Discussed w/ ICU RN  Objective Vital signs in last 24 hours: Vitals:   07/26/23 1000 07/26/23 1015 07/26/23 1030 07/26/23 1045  BP:      Pulse: 99 100 97 100  Resp: (!) 24 (!) 25 (!) 21 (!) 24  Temp:      TempSrc:      SpO2: 100% 98% 99% 99%  Weight:      Height:       Physical Exam: General: ill appearing, able to speak, some L facial droop Heart:RRR, +systolic murmur Lungs: b/l chest expansion, normal wob Abdomen:soft, Non-tender, non-distended Extremities: 1+ bilat UE edema, left aka Neuro: awake, following some commands Dialysis Access: left femoral TDC  OP HD: MWF [facility] 4h  400/1.5    77kg   2/3 bath  [facility] TDC    Heparin none - last OP HD 10/7, post wt 74.6kg - coming off 75- 76kg - misses HD once every other week approx - rocaltrol 1.50 mcg  - no esa  CXR - 10/23 - mostly clear, poss vasc congestion  Assessment/ Plan: MRSA bacteremia with MV endocarditis -s/p line holiday, new TDC placed by IR 10/18 -on vanc and cefepime -per primary service and CTS-high risk for surgery  Acute ischemic CVA/septic infarct 10/17 -neuro signed off, per primary service  AHRF -possibly secondary to aspiration. Intubated 10/21, extubated 10/24 -per PCCM  Volume:  - continues to require levo support despite volume expansion. Wt's up to 72kg (dry wt 77kg). CVP by fem cath was 7 this am. Probably can pull some fluid w/ next HD Monday.   ESRD -typically on HD MWF - started CRRT 10/22 --> stopped 10/30 - next HD monday  Dialysis access - Per IR note, occluded both right and left subclavian on venography therefore successful placement of left femoral TDC with the tip of the catheter in the infrarenal IVC.  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 Transfuse PRN for hgb <7. On ESA, cont darbe 40 mcg IV  weekly.   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 Cont calcitriol per tube. Holding sensipar as not swallowing well yet. No binders for now given low phos.     Shane Moselle  MD  [facility] 07/31/2023, 11:44 AM  Recent Labs  Lab 07/27/23 1627 07/27/23 1628 07/28/23 0356 07/28/23 0823 07/30/23 0406 07/31/23 0358  HGB  --    < >  --    < > 7.6* 7.8*  ALBUMIN 1.9*  --  1.9*  --   --   --   CALCIUM 9.7  --  9.7   < > 9.1 8.3*  PHOS 2.0*  --  2.5  --  4.4  --   CREATININE 1.66*   < > 1.64*   < > 4.78* 3.14*  K 3.8   < > 4.3   < > 5.4* 4.9   < > = values in this interval not displayed.    Inpatient medications:  sodium chloride   Intravenous Once   calcitRIOL  1.5 mcg Per Tube Q M,W,F   Chlorhexidine Gluconate Cloth  6 each Topical Q0600   clonazePAM  0.5 mg Oral BID   darbepoetin (ARANESP) injection - DIALYSIS  40 mcg Subcutaneous Q Fri-1800   feeding supplement (PROSource TF20)  60 mL Per Tube BID   fiber supplement (  BANATROL TF)  60 mL Per Tube BID   gabapentin  300 mg Per Tube QHS   insulin aspart  0-20 Units Subcutaneous Q4H   lidocaine  1 Application Topical Once   midodrine  15 mg Per Tube Q8H   multivitamin  1 tablet Per Tube BID   nutrition supplement (JUVEN)  1 packet Per Tube BID BM   mouth rinse  15 mL Mouth Rinse 4 times per day   QUEtiapine  100 mg Per Tube QHS   QUEtiapine  100 mg Oral Q0600   vancomycin variable dose per unstable renal function (pharmacist dosing)   Does not apply See admin instructions    anticoagulant sodium citrate     feeding supplement (OSMOLITE 1.5 CAL) 65 mL/hr at 07/31/23 0655   norepinephrine (LEVOPHED) Adult infusion 8 mcg/min (07/31/23 0655)   acetaminophen, acetaminophen, albuterol, alteplase, anticoagulant sodium citrate, Gerhardt's butt cream, heparin, lidocaine (PF), lidocaine-prilocaine, mouth rinse, oxyCODONE, pentafluoroprop-tetrafluoroeth

## 2023-07-31 NOTE — Plan of Care (Signed)
  Problem: Self-Care: Goal: Verbalization of feelings and concerns over difficulty with self-care will improve Outcome: Progressing Goal: Ability to communicate needs accurately will improve Outcome: Progressing   Problem: Coping: Goal: Will verbalize positive feelings about self Outcome: Progressing   Problem: Nutrition: Goal: Risk of aspiration will decrease Outcome: Progressing

## 2023-07-31 NOTE — Progress Notes (Signed)
  1515 Bedside monitor ringing out for ST elevations leads II and avF.  EKG performed at bedside by RN.  No new finding to report from previous EKG.  Dr. Merrily Pew shown EKG. No new orders

## 2023-07-31 NOTE — Progress Notes (Signed)
Patient with episode of hypotension 70/40 and requiring increased titration of levophed to obtain MAP goals of 65. Pola Corn MD notified for order adjustment of titrations on Levophed related to patient's condition and not responding to initial titrations.   Patient currently at 57mcg/min of levophed.   Lines checked for and zeroed for accuracy. NIBP reading in right forearm 114/89.   Episode occurred approximately 10 min after administration of AM seroquel and Midodrine

## 2023-07-31 NOTE — Progress Notes (Signed)
Pharmacy Antibiotic Note  [name] is a 42 y.o. 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 Pharmacy has been consulted for Vancomycin dosing.  Per chart notes, next scheduled HD will be Monday 11/4.   Plan: - Schedule vancomycin 750 mg IV q HD - Will monitor HD tolerance and dosing  Height: 5\' 7"  (170.2 cm) Weight: 72.1 kg (158 lb 15.2 oz) IBW/kg (Calculated) : 66.1  Temp (24hrs), Avg:99.9 F (37.7 C), Min:98.8 F (37.1 C), Max:101.7 F (38.7 C)  Recent Labs  Lab 07/26/23 0355 07/26/23 0401 07/26/23 0945 07/26/23 1211 07/26/23 1359 07/26/23 1503 07/27/23 0920 07/27/23 1627 07/28/23 0001 07/28/23 0356 07/28/23 2253 07/29/23 0609 07/30/23 0406 07/30/23 1004 07/31/23 0358  WBC 11.6*  --   --   --  15.0*  --   --   --   --   --   --  20.7* 16.4*  --  17.1*  CREATININE 1.76*   < >  --    < >  --    < >  --    < > 1.10 1.64*  --  3.29* 4.78*  --  3.14*  LATICACIDVEN  --   --   --   --   --   --   --   --   --   --  1.6  --   --   --   --   VANCOTROUGH  --   --  51*  --   --   --   --   --   --   --   --   --   --   --   --   VANCORANDOM  --   --   --   --   --   --  17  --   --   --   --   --   --  24  --    < > = values in this interval not displayed.    Estimated Creatinine Clearance: 28.4 mL/min (A) (by C-G formula based on SCr of 3.14 mg/dL (H)).    No Known Allergies  Antimicrobials this admission: Vancomycin 10/12 >> Cefepime 10/21 >> 10/23; restart 10/25 >> 10/29  Microbiology results: 10/12 MRSA PCR >> positive 10/12 CDiff antigen +, toxin -, PCR - >> negative 10/12 BCx >> 4/4 MRSA 10/13 BCx >> NGf 10/17 BCx >> NGf 10/21 RCx >> NOF  Thank you for allowing pharmacy to be a part of this patient's care.  Reece Leader, Colon Flattery, BCCP Clinical Pharmacist  07/31/2023 2:05 PM   [facility] pharmacy phone numbers are listed on [email or web address]

## 2023-07-31 NOTE — Plan of Care (Signed)
  Problem: Self-Care: Goal: Ability to participate in self-care as condition permits will improve Outcome: Progressing   Problem: Nutrition: Goal: Risk of aspiration will decrease Outcome: Progressing Goal: Dietary intake will improve Outcome: Progressing   Problem: Education: Goal: Knowledge of disease or condition will improve Outcome: Progressing Goal: Knowledge of secondary prevention will improve (MUST DOCUMENT ALL) Outcome: Progressing

## 2023-07-31 NOTE — Progress Notes (Signed)
NAME:  [name], MRN:  [id number], DOB:  September 17, 1981, LOS: 21 ADMISSION DATE:  07/09/2023, CONSULTATION DATE:  07/10/2023 REFERRING MD:  Suezanne Jacquet, CHIEF COMPLAINT:  DKA, AMS   History of Present Illness:  Patient is a 42 yo M w/ pertinent PMH ESRD on HD MWF, IDDM, gastroparesis, left AKA, HTN presents to [facility] on 10/11 w/ AMS.   Patient recently admitted to [facility] on 7/12 sepsis from right diabetic foot ulcer. Cultures w/ MRSA. Patient had dialysis catheter removed and replaced. ID treated w/ vanc. Patient refused echo and left on 7/17 against medical advise.    On 10/11 patient was found by family member on ground altered. EMS arrived and dialysis catheter partially removed. Patient more alert for them. Has been complaining of diarrhea x1 week per family. Unsure if patient went to dialysis today. Transferred to [facility] ED. On arrival bp stable 107/70 and afebrile. Patient remains confused and occasionally with agitation. Given dilaudid. CBG 497. Beta H >8. VBG ph 7.06, 16, 54, 4.7. Given iv fluids and started on insulin drip for DKA. CT head negative for acute abnormality. Cxr no significant findings. CT abd/pelvis no acute findings; possible steatosis vs. Liver mass 7 x 4 cm recommending f/u w/ MRI. WBC 20.7 and LA 2.4. Sepsis protocol initiated. Cultures obtained and started on vanc/zosyn. Given ams, dka, and soft bp pccm consulted for icu admission.   10/21 pccm called overnight to assess for worsening hypoxia and AMS after vomiting. On NRB w/ sats 70-80s. Hypoglycemic given dextrose which improved his mental status some. Transferred to 2H icu for intubation.     Pertinent  Medical History  ESRD on HD, HTN, diabetes, diabetic gastroparesis, cellulitis of left foot, left AKA  Significant Hospital Events: Including procedures, antibiotic start and stop dates in addition to other pertinent events   10/12: altered mental status, DKA 10/12: blood cultures positive for MRSA 10/13 TTE LVEF 25-30% w/  severely depressed LVF, global hypokinesis, gd I diastolic dysfxn, mod RV fxn reduction. Possible filamentous structure on MV 10/16 TEE LVEF 30-35%RV fxn nml. Large mass (2.00 cm x 0.88 cm ) on the posterior mitral leaflet which appeared to be extending in the mitral annulus. There are two mobile masses visible on the anterior leaflet of the tricupid valve which also appears to have a perforation. Another mobile  mass ( 0.68 cm x 0.67 cm) on the posterior tricupid leaflet. Moderate Tricuspid regurgitation which appears to  be through the leaflet perforation  10/16 seen by Cardiothoracic surg. Felt poor candidate. Recommended 6 weeks abx and recommended repeat TEE 1 week 10/17 noting left arm weakness. Not clear when started.  MRI showed Right MCA infarct w/ acute infarcts in left occipital and bilateral cerebellum felt c/w septic emboli. Neuro consulted.  10/18 tunneled left femoral vasc cath placed after catheter holiday  10/20 palliative care consulted later that day worsening hypoxia. Sats 70s. After vomiting. More lethargic  10/21: resp failure requiring intubation shortly after midnight  10/22- TEE 10/22 with EF 35%, LV global hypokinesis. Very large vegetation lateral scallop of posterior mitral leaflet 3.9cm length 1 cm width, highly mobile, prolapsing into LV. No large vegetation seen on TV.  Later that afternoon and abrupt hemodynamic changes requiring escalation of vasopressor support.  Felt given the size of his vegetation and increased bulk of the vegetation infections unlikely to clear.  He was felt to be decompensation may have due to of breath leaflet perforation and acute mitral valve regurgb 10/23: per nursing less  responsive than yesterday, increased ETT secretions. Clotting off CRRT. Ct head showing expected evolution of the right MCA stroke.  Cardiothoracic surgery re-consulted for worsening MVE. Felt too high risk here. Recommended referral to tertiary care  10/24 agitated when  sedation decreased, is purposeful but does not follow commands. Tolerating UF gaols of -150. Still on NE infusion but extubated later that afternoon after passing SBT 10/25 failed swallow eval still on just 3 lpm O2. Encephalopathic. Added seroquel in effort to get him off the precedex. Cardiology told no beds at [facility]. Not clear if accepted or not. Worsening pressor requirements. Got blood for hgb 6. Cefepime resumed for clinical decline and leukocytosis  10/26 hemoglobin again dropped to 6.3 overnight with increased pressor requirement yesterday afternoon now on vaso and levo. 10/27 hypoglycemic episode overnight, mental status improved a little over weekend  10/28 getting another unit of blood more awake.  Oriented x 2.  Still on Precedex.  Having liquid stools. 10/30 off Precedex, low-dose levo.  Plan to transition to IHD 10/31 seen by overnight team for hypoxia and somnolence ABG with mild hypercapnia. Again declined by [facility] because they were at capacity. More awake. Doing better w/ Bed side swallow but still likely aspirating. CRRT stopped.  11/1 adjustments made for better glycemic control.  Much more awake but a little confused  Interim History / Subjective:  Overnight patient became hypotensive after getting Klonopin and Seroquel, his Levophed requirement went up to 13 mics Currently off pressors, on room air Mental status has cleared  Objective   Blood pressure 114/89, pulse (!) 127, temperature 99.2 F (37.3 C), temperature source Oral, resp. rate (!) 21, height 5\' 7"  (1.702 m), weight 72.1 kg, SpO2 100%.        Intake/Output Summary (Last 24 hours) at 07/31/2023 1200 Last data filed at 07/31/2023 0800 Gross per 24 hour  Intake 1524.04 ml  Output 70 ml  Net 1454.04 ml   Filed Weights   07/30/23 1509 07/30/23 1940 07/31/23 0500  Weight: 74.6 kg 74.6 kg 72.1 kg    Examination: General: Chronically ill-appearing middle-age male, lying on the bed HEENT: /AT, eyes anicteric.   moist mucus membranes Neuro: Alert, awake following commands, restless.  Left hemiparesis Chest: Coarse breath sounds, no wheezes or rhonchi Heart: Regular rate and rhythm, no murmurs or gallops Abdomen: Soft, nontender, nondistended, bowel sounds present Skin: No rash Extremities: Status post left AKA  Labs reviewed  Resolved Hospital Problem list   DKA  Thrombocytopenia  Assessment & Plan:  Acute hypoxic respiratory failure; due to aspiration PNA, evolving cavitary PNA and mucous plugging  X-ray chest showing stable left upper lung cavitary lesion, suspect this is embolic from tricuspid valve involvement Extubated on 10/24 Currently on room air Continue IV antibiotics as below Mobilize as able Consider repeat CT scan at 4-6 weeks   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  Patient intermittently gets hypotensive requires vasopressor support with Levophed, last night he was on 13 mics of Levophed after getting Seroquel and Klonopin Currently off vasopressor support Appreciate infectious disease follow-up Continue IV antibiotics with vancomycin Completed 7 days cefepime cont to watch fever and WBC curve Keep euvolemic  Cont midodrine 15mg  tid   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 Had low-grade fever yesterday, also he has ongoing agitation His heart rate is better than yesterday Continue telemetry monitoring DVT was ruled out  Acute metabolic encephalopathy w/ agitated delirium  Acute Right MCA  stroke/septic infarct w/ left sided hemiparesis   Mental status has significantly  improved Continue delirium precautions Change Klonopin to as needed Continue scheduled Seroquel  End-stage renal disease on hemodialysis (MWF at baseline) Has occluded bilateral [place] veins  Mild hyperkalemia off CRRT CRRT stopped 10/31 Next IHD on Monday Nephrology is following  Dysphagia Continue tube feeds Failed speech and swallow evaluation multiple times  T2DM w/ hyperglycemia and hypoglycemia Patient with brittle diabetes Restarted back on 5 units of Semglee twice daily Continue sliding scale insulin with CBG goal 140-180  Anemia and thrombocytopenia critical illness S/p 4 units total PRBC from 25th to 28th Monitor H&H and platelet count Watch for signs of bleeding  Incidental finding of Liver steatosis vs. Liver mass CT abd/pelvis showing 7 x 4 cm density in anterior liver  Outpatient follow-up with GI   Best Practice (right click and "Reselect all SmartList Selections" daily)   Diet/type: tubefeeds DVT prophylaxis: SCD GI prophylaxis: N/A Lines: Central line, Dialysis Catheter, Arterial Line, and yes and it is still needed Foley:  Yes, and it is still needed Code Status:  full code Last date of multidisciplinary goals of care discussion: Continue to update patient and family daily   The patient is critically ill due to acute infective endocarditis/MRSA bacteremia.  Critical care was necessary to treat or prevent imminent or life-threatening deterioration.  Critical care was time spent personally by me on the following activities: development of treatment plan with patient and/or surrogate as well as nursing, discussions with consultants, evaluation of patient's response to treatment, examination of patient, obtaining history from patient or surrogate, ordering and performing treatments and interventions, ordering and review of laboratory studies, ordering and review of radiographic studies, pulse oximetry, re-evaluation of patient's condition and participation in  multidisciplinary rounds.   During this encounter critical care time was devoted to patient care services described in this note for 35 minutes.     Cheri Fowler, MD [place] [facility] See Amion for pager If no response to pager, please call [phone] until 7pm After 7pm, Please call E-link [phone]

## 2023-08-01 DIAGNOSIS — D62 Acute posthemorrhagic anemia: Secondary | ICD-10-CM | POA: Diagnosis not present

## 2023-08-01 DIAGNOSIS — I059 Rheumatic mitral valve disease, unspecified: Secondary | ICD-10-CM | POA: Diagnosis not present

## 2023-08-01 DIAGNOSIS — G9341 Metabolic encephalopathy: Secondary | ICD-10-CM | POA: Diagnosis not present

## 2023-08-01 DIAGNOSIS — J9601 Acute respiratory failure with hypoxia: Secondary | ICD-10-CM | POA: Diagnosis not present

## 2023-08-01 LAB — BASIC METABOLIC PANEL
Anion gap: 13 (ref 5–15)
BUN: 80 mg/dL — ABNORMAL HIGH (ref 6–20)
CO2: 28 mmol/L (ref 22–32)
Calcium: 8.8 mg/dL — ABNORMAL LOW (ref 8.9–10.3)
Chloride: 92 mmol/L — ABNORMAL LOW (ref 98–111)
Creatinine, Ser: 4.54 mg/dL — ABNORMAL HIGH (ref 0.61–1.24)
GFR, Estimated: 16 mL/min — ABNORMAL LOW (ref >60)
Glucose, Bld: 144 mg/dL — ABNORMAL HIGH (ref 70–99)
Potassium: 5.5 mmol/L — ABNORMAL HIGH (ref 3.5–5.1)
Sodium: 133 mmol/L — ABNORMAL LOW (ref 135–145)

## 2023-08-01 LAB — CBC
HCT: 25.2 % — ABNORMAL LOW (ref 39.0–52.0)
Hemoglobin: 7.6 g/dL — ABNORMAL LOW (ref 13.0–17.0)
MCH: 29.9 pg (ref 26.0–34.0)
MCHC: 30.2 g/dL (ref 30.0–36.0)
MCV: 99.2 fL (ref 80.0–100.0)
Platelets: 304 10*3/uL (ref 150–400)
RBC: 2.54 MIL/uL — ABNORMAL LOW (ref 4.22–5.81)
RDW: 17.3 % — ABNORMAL HIGH (ref 11.5–15.5)
WBC: 20 10*3/uL — ABNORMAL HIGH (ref 4.0–10.5)
nRBC: 0 % (ref 0.0–0.2)

## 2023-08-01 LAB — GLUCOSE, CAPILLARY
Glucose-Capillary: 133 mg/dL — ABNORMAL HIGH (ref 70–99)
Glucose-Capillary: 201 mg/dL — ABNORMAL HIGH (ref 70–99)
Glucose-Capillary: 189 mg/dL — ABNORMAL HIGH (ref 70–99)
Glucose-Capillary: 75 mg/dL (ref 70–99)
Glucose-Capillary: 120 mg/dL — ABNORMAL HIGH (ref 70–99)
Glucose-Capillary: 63 mg/dL — ABNORMAL LOW (ref 70–99)
Glucose-Capillary: 110 mg/dL — ABNORMAL HIGH (ref 70–99)
Glucose-Capillary: 144 mg/dL — ABNORMAL HIGH (ref 70–99)

## 2023-08-01 LAB — PHOSPHORUS: Phosphorus: 5.5 mg/dL — ABNORMAL HIGH (ref 2.5–4.6)

## 2023-08-01 LAB — MAGNESIUM: Magnesium: 2.4 mg/dL (ref 1.7–2.4)

## 2023-08-01 MED ORDER — ALBUMIN HUMAN 25 % IV SOLN
12.5000 g | Freq: Once | INTRAVENOUS | Status: AC
Start: 2023-08-01 — End: 2023-08-01
  Administered 2023-08-01: 12.5 g via INTRAVENOUS
  Filled 2023-08-01: qty 50

## 2023-08-01 MED ORDER — DEXTROSE 50 % IV SOLN
12.5000 g | INTRAVENOUS | Status: AC
  Administered 2023-08-01: 12.5 g via INTRAVENOUS

## 2023-08-01 MED ORDER — DEXTROSE 50 % IV SOLN
INTRAVENOUS | Status: AC
  Filled 2023-08-01: qty 50

## 2023-08-01 MED ORDER — SODIUM ZIRCONIUM CYCLOSILICATE 10 G PO PACK
10.0000 g | PACK | Freq: Three times a day (TID) | ORAL | Status: AC
  Administered 2023-08-01 (×2): 10 g via ORAL
  Filled 2023-08-01 (×2): qty 1

## 2023-08-01 MED ORDER — NEPRO/CARBSTEADY PO LIQD
1000.0000 mL | ORAL | Status: DC
  Administered 2023-08-01 – 2023-08-03 (×3): 1000 mL
  Filled 2023-08-01 (×3): qty 1000

## 2023-08-01 MED ORDER — METOPROLOL TARTRATE 12.5 MG HALF TABLET
12.5000 mg | ORAL_TABLET | Freq: Two times a day (BID) | ORAL | Status: DC
  Administered 2023-08-01 (×2): 12.5 mg
  Filled 2023-08-01 (×2): qty 1

## 2023-08-01 MED ORDER — HEPARIN SODIUM (PORCINE) 5000 UNIT/ML IJ SOLN
5000.0000 [IU] | Freq: Three times a day (TID) | INTRAMUSCULAR | Status: DC
  Administered 2023-08-01 – 2023-08-02 (×3): 5000 [IU] via SUBCUTANEOUS
  Filled 2023-08-01 (×3): qty 1

## 2023-08-01 MED ORDER — INSULIN GLARGINE-YFGN 100 UNIT/ML ~~LOC~~ SOLN
5.0000 [IU] | Freq: Every day | SUBCUTANEOUS | Status: DC
  Administered 2023-08-02 – 2023-08-06 (×5): 5 [IU] via SUBCUTANEOUS
  Filled 2023-08-01 (×5): qty 0.05

## 2023-08-01 MED ORDER — METOPROLOL TARTRATE 12.5 MG HALF TABLET: 12.5000 mg | ORAL_TABLET | Freq: Two times a day (BID) | ORAL | Status: DC

## 2023-08-01 MED ORDER — LORAZEPAM 2 MG/ML IJ SOLN
INTRAMUSCULAR | Status: AC
  Administered 2023-08-01: 1 mg via INTRAVENOUS
  Filled 2023-08-01: qty 1

## 2023-08-01 MED ORDER — CHLORHEXIDINE GLUCONATE CLOTH 2 % EX PADS: 6.0000 | MEDICATED_PAD | Freq: Every day | CUTANEOUS | Status: DC

## 2023-08-01 MED ORDER — LORAZEPAM 2 MG/ML IJ SOLN
1.0000 mg | INTRAMUSCULAR | Status: AC | PRN
  Administered 2023-08-02 – 2023-08-04 (×2): 1 mg via INTRAVENOUS
  Filled 2023-08-01 (×2): qty 1

## 2023-08-01 MED ORDER — NOREPINEPHRINE 16 MG/250ML-% IV SOLN
0.0000 ug/min | INTRAVENOUS | Status: DC
  Filled 2023-08-01: qty 250

## 2023-08-01 NOTE — Progress Notes (Signed)
eLink Physician-Brief Progress Note Patient Name: [name] DOB: 21-Feb-1981 MRN: [id number]   Date of Service  08/01/2023  HPI/Events of Note  Patient did calm down with the Ativan However BP now 70s/40s Norepinephrine was recently discontinued Fluid balance positive > 5 liters  eICU Interventions  Norepinephrine re-ordered as quad strength as requested. To be instilled via central line     Intervention Category Intermediate Interventions: Hypotension - evaluation and management  Darl Pikes 08/01/2023, 9:51 PM

## 2023-08-01 NOTE — Progress Notes (Signed)
eLink Physician-Brief Progress Note Patient Name: [name] DOB: May 09, 1981 MRN: [id number]   Date of Service  08/01/2023  HPI/Events of Note  Patient having episodes of agitation. Concern has 2 femoral lines being pulled as he is kicking as he is kicking the footboard.  Seen restless with right arm hitting the siderails.  He is already being given clonazepam and Quietiapine.  Also febrile at 101 despite Tylenol  eICU Interventions  Ordered los dose Ativan 1 mg IV prn q 4 x 3 doses Physical cooling measures. On vancomycin for MRSA endocarditis. IDS following. Discussed with bedside RN     Intervention Category Intermediate Interventions: Other:  Darl Pikes 08/01/2023, 8:55 PM

## 2023-08-01 NOTE — Progress Notes (Addendum)
[facility] NEPHROLOGY PROGRESS NOTE  Subjective:  Seen and examined in ICU.  Net I/O yesterday: +2 L yesterday Pressors: remains on levo very low 0-4 Discussed w/ ICU RN  Objective Vital signs in last 24 hours: Vitals:   07/26/23 1000 07/26/23 1015 07/26/23 1030 07/26/23 1045  BP:      Pulse: 99 100 97 100  Resp: (!) 24 (!) 25 (!) 21 (!) 24  Temp:      TempSrc:      SpO2: 100% 98% 99% 99%  Weight:      Height:       Physical Exam: General: ill appearing, able to speak, some L facial droop Heart:RRR, +systolic murmur Lungs: b/l chest expansion, normal wob Abdomen:soft, Non-tender, non-distended Extremities: no pitting edema, left aka Neuro: awake, following some commands Dialysis Access: left femoral TDC  OP HD: MWF [facility] 4h  400/1.5    77kg   2/3 bath  [facility] TDC    Heparin none - last OP HD 10/7, post wt 74.6kg - coming off 75- 76kg - misses HD once every other week approx - rocaltrol 1.50 mcg  - no esa  CXR - 10/23 - mostly clear, poss vasc congestion  Assessment/ Plan: MRSA bacteremia with MV endocarditis -s/p line holiday, new TDC placed by IR 10/18 -on vanc and cefepime -per primary service and CTS-high risk for surgery  Acute ischemic CVA/septic infarct 10/17 -neuro signed off, per primary service  AHRF -possibly secondary to aspiration. Intubated 10/21, extubated 10/24 -per PCCM  Volume:  - remains 10kg under dry wt, no sig edema - last CXR 10/23, no sig edema  Hyperkalemia - change TF"s to Nepro - low K+ bath w/ HD tomorrow  Hypotension: - minimal levo gtt this am, trying to get off  ESRD -typically on HD MWF - started CRRT 10/22 --> stopped 10/30 - next HD monday  Dialysis access - Per IR note, occluded both right and left subclavian on venography therefore successful placement of left femoral TDC with the tip of the catheter in the infrarenal IVC.  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 Transfuse PRN for hgb <7. On  ESA, cont darbe 40 mcg IV weekly.   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 Cont calcitriol per tube. Holding sensipar as not swallowing well yet. No binders for now given low phos.     Shane Moselle  MD  [facility] 08/01/2023, 3:19 PM  Recent Labs  Lab 07/27/23 1627 07/27/23 1628 07/28/23 0356 07/28/23 0823 07/30/23 0406 07/31/23 0358 08/01/23 0331  HGB  --    < >  --    < > 7.6* 7.8* 7.6*  ALBUMIN 1.9*  --  1.9*  --   --   --   --   CALCIUM 9.7  --  9.7   < > 9.1 8.3* 8.8*  PHOS 2.0*  --  2.5  --  4.4  --  5.5*  CREATININE 1.66*   < > 1.64*   < > 4.78* 3.14* 4.54*  K 3.8   < > 4.3   < > 5.4* 4.9 5.5*   < > = values in this interval not displayed.    Inpatient medications:  calcitRIOL  1.5 mcg Per Tube Q M,W,F   Chlorhexidine Gluconate Cloth  6 each Topical Q0600   darbepoetin (ARANESP) injection - DIALYSIS  40 mcg Subcutaneous Q Fri-1800   feeding supplement (PROSource TF20)  60 mL Per Tube BID   fiber  supplement (BANATROL TF)  60 mL Per Tube BID   gabapentin  300 mg Per Tube QHS   heparin  5,000 Units Subcutaneous Q8H   insulin aspart  0-20 Units Subcutaneous Q4H   insulin glargine-yfgn  5 Units Subcutaneous BID   lidocaine  1 Application Topical Once   metoprolol tartrate  12.5 mg Per Tube BID   midodrine  15 mg Per Tube Q8H   multivitamin  1 tablet Per Tube BID   nutrition supplement (JUVEN)  1 packet Per Tube BID BM   mouth rinse  15 mL Mouth Rinse 4 times per day   QUEtiapine  100 mg Per Tube QHS   QUEtiapine  100 mg Oral Q0600   sodium zirconium cyclosilicate  10 g Oral TID    anticoagulant sodium citrate     feeding supplement (NEPRO CARB STEADY) 65 mL/hr at 08/01/23 1400   [START ON 08/02/2023] vancomycin     acetaminophen, acetaminophen, albuterol, alteplase, anticoagulant sodium citrate, clonazePAM, Gerhardt's butt cream, heparin, lidocaine (PF), lidocaine-prilocaine, mouth rinse, oxyCODONE, pentafluoroprop-tetrafluoroeth

## 2023-08-01 NOTE — Progress Notes (Signed)
NAME:  [name], MRN:  [id number], DOB:  01-26-1981, LOS: 22 ADMISSION DATE:  07/09/2023, CONSULTATION DATE:  07/10/2023 REFERRING MD:  Suezanne Jacquet, CHIEF COMPLAINT:  DKA, AMS   History of Present Illness:  Patient is a 42 yo M w/ pertinent PMH ESRD on HD MWF, IDDM, gastroparesis, left AKA, HTN presents to [facility] on 10/11 w/ AMS.   Patient recently admitted to [facility] on 7/12 sepsis from right diabetic foot ulcer. Cultures w/ MRSA. Patient had dialysis catheter removed and replaced. ID treated w/ vanc. Patient refused echo and left on 7/17 against medical advise.    On 10/11 patient was found by family member on ground altered. EMS arrived and dialysis catheter partially removed. Patient more alert for them. Has been complaining of diarrhea x1 week per family. Unsure if patient went to dialysis today. Transferred to [facility] ED. On arrival bp stable 107/70 and afebrile. Patient remains confused and occasionally with agitation. Given dilaudid. CBG 497. Beta H >8. VBG ph 7.06, 16, 54, 4.7. Given iv fluids and started on insulin drip for DKA. CT head negative for acute abnormality. Cxr no significant findings. CT abd/pelvis no acute findings; possible steatosis vs. Liver mass 7 x 4 cm recommending f/u w/ MRI. WBC 20.7 and LA 2.4. Sepsis protocol initiated. Cultures obtained and started on vanc/zosyn. Given ams, dka, and soft bp pccm consulted for icu admission.   10/21 pccm called overnight to assess for worsening hypoxia and AMS after vomiting. On NRB w/ sats 70-80s. Hypoglycemic given dextrose which improved his mental status some. Transferred to 2H icu for intubation.     Pertinent  Medical History  ESRD on HD, HTN, diabetes, diabetic gastroparesis, cellulitis of left foot, left AKA  Significant Hospital Events: Including procedures, antibiotic start and stop dates in addition to other pertinent events   10/12: altered mental status, DKA 10/12: blood cultures positive for MRSA 10/13 TTE LVEF 25-30% w/  severely depressed LVF, global hypokinesis, gd I diastolic dysfxn, mod RV fxn reduction. Possible filamentous structure on MV 10/16 TEE LVEF 30-35%RV fxn nml. Large mass (2.00 cm x 0.88 cm ) on the posterior mitral leaflet which appeared to be extending in the mitral annulus. There are two mobile masses visible on the anterior leaflet of the tricupid valve which also appears to have a perforation. Another mobile  mass ( 0.68 cm x 0.67 cm) on the posterior tricupid leaflet. Moderate Tricuspid regurgitation which appears to  be through the leaflet perforation  10/16 seen by Cardiothoracic surg. Felt poor candidate. Recommended 6 weeks abx and recommended repeat TEE 1 week 10/17 noting left arm weakness. Not clear when started.  MRI showed Right MCA infarct w/ acute infarcts in left occipital and bilateral cerebellum felt c/w septic emboli. Neuro consulted.  10/18 tunneled left femoral vasc cath placed after catheter holiday  10/20 palliative care consulted later that day worsening hypoxia. Sats 70s. After vomiting. More lethargic  10/21: resp failure requiring intubation shortly after midnight  10/22- TEE 10/22 with EF 35%, LV global hypokinesis. Very large vegetation lateral scallop of posterior mitral leaflet 3.9cm length 1 cm width, highly mobile, prolapsing into LV. No large vegetation seen on TV.  Later that afternoon and abrupt hemodynamic changes requiring escalation of vasopressor support.  Felt given the size of his vegetation and increased bulk of the vegetation infections unlikely to clear.  He was felt to be decompensation may have due to of breath leaflet perforation and acute mitral valve regurgb 10/23: per nursing less  responsive than yesterday, increased ETT secretions. Clotting off CRRT. Ct head showing expected evolution of the right MCA stroke.  Cardiothoracic surgery re-consulted for worsening MVE. Felt too high risk here. Recommended referral to tertiary care  10/24 agitated when  sedation decreased, is purposeful but does not follow commands. Tolerating UF gaols of -150. Still on NE infusion but extubated later that afternoon after passing SBT 10/25 failed swallow eval still on just 3 lpm O2. Encephalopathic. Added seroquel in effort to get him off the precedex. Cardiology told no beds at [facility]. Not clear if accepted or not. Worsening pressor requirements. Got blood for hgb 6. Cefepime resumed for clinical decline and leukocytosis  10/26 hemoglobin again dropped to 6.3 overnight with increased pressor requirement yesterday afternoon now on vaso and levo. 10/27 hypoglycemic episode overnight, mental status improved a little over weekend  10/28 getting another unit of blood more awake.  Oriented x 2.  Still on Precedex.  Having liquid stools. 10/30 off Precedex, low-dose levo.  Plan to transition to IHD 10/31 seen by overnight team for hypoxia and somnolence ABG with mild hypercapnia. Again declined by [facility] because they were at capacity. More awake. Doing better w/ Bed side swallow but still likely aspirating. CRRT stopped.  11/1 adjustments made for better glycemic control.  Much more awake but a little confused  Interim History / Subjective:  No overnight issues Patient remain off vasopressor support Currently on room air Though he remained tachycardic into 140s  Mental status has improved   Objective   Blood pressure (!) 84/75, pulse (!) 145, temperature 98.7 F (37.1 C), temperature source Axillary, resp. rate 14, height 5\' 7"  (1.702 m), weight 67.3 kg, SpO2 96%.        Intake/Output Summary (Last 24 hours) at 08/01/2023 1039 Last data filed at 08/01/2023 0600 Gross per 24 hour  Intake 1812.96 ml  Output 235 ml  Net 1577.96 ml   Filed Weights   07/30/23 1940 07/31/23 0500 08/01/23 0500  Weight: 74.6 kg 72.1 kg 67.3 kg    Examination: General: Chronically ill-appearing male, lying on the bed HEENT: [place]/AT, eyes anicteric.  moist mucus membranes Neuro: Alert,  awake following commands Chest: Coarse breath sounds, no wheezes or rhonchi Heart: Tachycardic, regular rhythm, no murmurs or gallops Abdomen: Soft, nontender, nondistended, bowel sounds present Skin: Multiple scar marks noted on the chest Extremities: Status post left AKA  Labs reviewed  Resolved Hospital Problem list   DKA  Thrombocytopenia  Assessment & Plan:  Acute hypoxic respiratory failure; due to aspiration PNA, evolving cavitary PNA and mucous plugging  X-ray chest showing stable left upper lung cavitary lesion, suspect this is embolic from tricuspid valve involvement Extubated on 10/24 Currently on room air Continue IV antibiotics per ID recommendations Out of bed to chair Consider repeat CT scan at 4-6 weeks   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  Shock has resolved Patient remain off vasopressor support Though he remains tachycardic with sinus rhythm Appreciate infectious disease follow-up Continue IV antibiotics with vancomycin Completed 7 days cefepime cont to watch fever and WBC curve Keep euvolemic  Cont midodrine 15mg  tid   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 He is afebrile, not on vasopressor support Heart rate is still remain in 130s to 140s DVT was ruled out Started on low-dose metoprolol 12.5 mg twice daily Continue telemetry monitoring DVT was ruled out  Acute metabolic encephalopathy w/ agitated delirium  Acute Right MCA  stroke/septic infarct w/ left sided hemiparesis   Mental status has significantly improved, he is awake following commands Continue delirium  precautions Change Klonopin to as needed Continue scheduled Seroquel  End-stage renal disease on hemodialysis (MWF at baseline) Has occluded bilateral  veins  Mild hyperkalemia off CRRT CRRT stopped 10/31 Next IHD on Monday Nephrology is following  Dysphagia Continue tube feeds Failed speech and swallow evaluation multiple times  T2DM w/ hyperglycemia and hypoglycemia Patient with brittle diabetes Blood sugars are better controlled Continues Semglee twice daily Continue sliding scale insulin with CBG goal 140-180  Anemia and thrombocytopenia critical illness S/p 4 units total PRBC Monitor H&H and platelet count Watch for signs of bleeding  Incidental finding of Liver steatosis vs. Liver mass CT abd/pelvis showing 7 x 4 cm density in anterior liver  Outpatient follow-up with GI   Best Practice (right click and "Reselect all SmartList Selections" daily)   Diet/type: tubefeeds DVT prophylaxis: Subcutaneous heparin GI prophylaxis: N/A Lines: Central line, Dialysis Catheter, Arterial Line, and yes and it is still needed Foley:  Yes, and it is still needed Code Status:  full code Last date of multidisciplinary goals of care discussion: Continue to update patient and family daily     Cheri Fowler, MD [facility] See Amion for pager If no response to pager, please call [phone] until 7pm After 7pm, Please call E-link [phone]

## 2023-08-01 NOTE — Progress Notes (Signed)
     Brief progress note:  Patient admitted for septic shock secondary to MRSA bacteremia and infective endocarditis.  Hospital course complicated by acute stroke and persistent hypotension. Cardiothoracic surgery has recommended transfer to a tertiary medical center.  Initial palliative consult completed 07/18/23. Goals are for treatment and recovery to the degree this is possible given patient's multiple acute and chronic issues.   Palliative Medicine will continue to follow for needs and support.  Please contact our team at [phone] if there are immediate needs or patient's condition changes.    Sherlean Foot, NP-C Palliative Medicine   Please call Palliative Medicine team phone with any questions [phone]. For individual providers please see AMION.   No charge

## 2023-08-02 ENCOUNTER — Inpatient Hospital Stay (HOSPITAL_COMMUNITY): Payer: Medicare HMO

## 2023-08-02 DIAGNOSIS — I059 Rheumatic mitral valve disease, unspecified: Secondary | ICD-10-CM | POA: Diagnosis not present

## 2023-08-02 DIAGNOSIS — B9562 Methicillin resistant Staphylococcus aureus infection as the cause of diseases classified elsewhere: Secondary | ICD-10-CM | POA: Diagnosis not present

## 2023-08-02 DIAGNOSIS — D72829 Elevated white blood cell count, unspecified: Secondary | ICD-10-CM | POA: Diagnosis not present

## 2023-08-02 DIAGNOSIS — R509 Fever, unspecified: Secondary | ICD-10-CM

## 2023-08-02 DIAGNOSIS — I6340 Cerebral infarction due to embolism of unspecified cerebral artery: Secondary | ICD-10-CM

## 2023-08-02 DIAGNOSIS — M7989 Other specified soft tissue disorders: Secondary | ICD-10-CM

## 2023-08-02 LAB — CBC
HCT: 23.5 % — ABNORMAL LOW (ref 39.0–52.0)
Hemoglobin: 7 g/dL — ABNORMAL LOW (ref 13.0–17.0)
MCH: 29.8 pg (ref 26.0–34.0)
MCHC: 29.8 g/dL — ABNORMAL LOW (ref 30.0–36.0)
MCV: 100 fL (ref 80.0–100.0)
Platelets: 312 10*3/uL (ref 150–400)
RBC: 2.35 MIL/uL — ABNORMAL LOW (ref 4.22–5.81)
RDW: 17.6 % — ABNORMAL HIGH (ref 11.5–15.5)
WBC: 17.9 10*3/uL — ABNORMAL HIGH (ref 4.0–10.5)
nRBC: 0 % (ref 0.0–0.2)

## 2023-08-02 LAB — GLUCOSE, CAPILLARY
Glucose-Capillary: 180 mg/dL — ABNORMAL HIGH (ref 70–99)
Glucose-Capillary: 177 mg/dL — ABNORMAL HIGH (ref 70–99)
Glucose-Capillary: 256 mg/dL — ABNORMAL HIGH (ref 70–99)
Glucose-Capillary: 269 mg/dL — ABNORMAL HIGH (ref 70–99)
Glucose-Capillary: 115 mg/dL — ABNORMAL HIGH (ref 70–99)
Glucose-Capillary: 163 mg/dL — ABNORMAL HIGH (ref 70–99)

## 2023-08-02 LAB — BASIC METABOLIC PANEL
Anion gap: 15 (ref 5–15)
BUN: 108 mg/dL — ABNORMAL HIGH (ref 6–20)
CO2: 29 mmol/L (ref 22–32)
Calcium: 9.2 mg/dL (ref 8.9–10.3)
Chloride: 90 mmol/L — ABNORMAL LOW (ref 98–111)
Creatinine, Ser: 6.02 mg/dL — ABNORMAL HIGH (ref 0.61–1.24)
GFR, Estimated: 11 mL/min — ABNORMAL LOW (ref >60)
Glucose, Bld: 234 mg/dL — ABNORMAL HIGH (ref 70–99)
Potassium: 5.5 mmol/L — ABNORMAL HIGH (ref 3.5–5.1)
Sodium: 134 mmol/L — ABNORMAL LOW (ref 135–145)

## 2023-08-02 LAB — MAGNESIUM: Magnesium: 2.7 mg/dL — ABNORMAL HIGH (ref 1.7–2.4)

## 2023-08-02 LAB — PHOSPHORUS: Phosphorus: 6 mg/dL — ABNORMAL HIGH (ref 2.5–4.6)

## 2023-08-02 MED ORDER — INSULIN ASPART 100 UNIT/ML IJ SOLN
0.0000 [IU] | INTRAMUSCULAR | Status: DC
Start: 2023-08-02 — End: 2023-08-08
  Administered 2023-08-02: 5 [IU] via SUBCUTANEOUS
  Administered 2023-08-02 – 2023-08-03 (×2): 2 [IU] via SUBCUTANEOUS
  Administered 2023-08-03: 3 [IU] via SUBCUTANEOUS
  Administered 2023-08-03: 5 [IU] via SUBCUTANEOUS
  Administered 2023-08-03: 2 [IU] via SUBCUTANEOUS
  Administered 2023-08-03: 5 [IU] via SUBCUTANEOUS
  Administered 2023-08-04 – 2023-08-05 (×5): 2 [IU] via SUBCUTANEOUS
  Administered 2023-08-05: 3 [IU] via SUBCUTANEOUS
  Administered 2023-08-05 – 2023-08-06 (×4): 5 [IU] via SUBCUTANEOUS
  Administered 2023-08-06: 2 [IU] via SUBCUTANEOUS
  Administered 2023-08-06: 5 [IU] via SUBCUTANEOUS
  Administered 2023-08-06: 3 [IU] via SUBCUTANEOUS
  Administered 2023-08-07: 1 [IU] via SUBCUTANEOUS
  Administered 2023-08-07: 3 [IU] via SUBCUTANEOUS
  Administered 2023-08-07: 2 [IU] via SUBCUTANEOUS
  Administered 2023-08-07: 3 [IU] via SUBCUTANEOUS
  Administered 2023-08-07: 2 [IU] via SUBCUTANEOUS
  Administered 2023-08-07: 1 [IU] via SUBCUTANEOUS
  Administered 2023-08-08: 7 [IU] via SUBCUTANEOUS
  Administered 2023-08-08: 5 [IU] via SUBCUTANEOUS
  Administered 2023-08-08: 3 [IU] via SUBCUTANEOUS

## 2023-08-02 MED ORDER — PANCRELIPASE (LIP-PROT-AMYL) 10440-39150 UNITS PO TABS
20880.0000 [IU] | ORAL_TABLET | Freq: Once | ORAL | Status: AC
  Administered 2023-08-02: 20880 [IU]
  Filled 2023-08-02: qty 2

## 2023-08-02 MED ORDER — QUETIAPINE FUMARATE 50 MG PO TABS
50.0000 mg | ORAL_TABLET | Freq: Every day | ORAL | Status: DC
  Administered 2023-08-02 – 2023-08-12 (×11): 50 mg
  Filled 2023-08-02 (×11): qty 1

## 2023-08-02 MED ORDER — HEPARIN BOLUS VIA INFUSION
4000.0000 [IU] | Freq: Once | INTRAVENOUS | Status: AC
  Administered 2023-08-02: 4000 [IU] via INTRAVENOUS
  Filled 2023-08-02: qty 4000

## 2023-08-02 MED ORDER — HEPARIN SODIUM (PORCINE) 1000 UNIT/ML DIALYSIS
1000.0000 [IU] | INTRAMUSCULAR | Status: DC | PRN
  Administered 2023-08-04: 1000 [IU]
  Filled 2023-08-02 (×3): qty 1

## 2023-08-02 MED ORDER — HEPARIN SODIUM (PORCINE) 1000 UNIT/ML IJ SOLN
INTRAMUSCULAR | Status: AC
  Administered 2023-08-02: 4600 [IU]
  Filled 2023-08-02: qty 5

## 2023-08-02 MED ORDER — SODIUM BICARBONATE 650 MG PO TABS
650.0000 mg | ORAL_TABLET | Freq: Once | ORAL | Status: AC
  Administered 2023-08-02: 650 mg
  Filled 2023-08-02: qty 1

## 2023-08-02 MED ORDER — NEPRO/CARBSTEADY PO LIQD: 237.0000 mL | ORAL | Status: DC | PRN

## 2023-08-02 MED ORDER — MIDODRINE HCL 5 MG PO TABS
20.0000 mg | ORAL_TABLET | Freq: Three times a day (TID) | ORAL | Status: DC
  Administered 2023-08-02 – 2023-08-06 (×11): 20 mg
  Filled 2023-08-02 (×11): qty 4

## 2023-08-02 MED ORDER — QUETIAPINE FUMARATE 50 MG PO TABS
50.0000 mg | ORAL_TABLET | Freq: Every day | ORAL | Status: DC
  Administered 2023-08-03 – 2023-08-13 (×11): 50 mg
  Filled 2023-08-02 (×11): qty 1

## 2023-08-02 MED ORDER — QUETIAPINE FUMARATE 100 MG PO TABS: 100.0000 mg | ORAL_TABLET | Freq: Every day | ORAL | Status: DC

## 2023-08-02 MED ORDER — HEPARIN (PORCINE) 25000 UT/250ML-% IV SOLN
1400.0000 [IU]/h | INTRAVENOUS | Status: DC
  Administered 2023-08-02: 1200 [IU]/h via INTRAVENOUS
  Administered 2023-08-03: 1400 [IU]/h via INTRAVENOUS
  Filled 2023-08-02 (×2): qty 250

## 2023-08-02 NOTE — Progress Notes (Signed)
PCCM INTERVAL PROGRESS NOTE   Discussed care with cardiothoracic surgery at [facility]. He was declined for transfer considering multiple prohibitive surgical risks including, but not limited to large MCA stroke on 10/18.    Joneen Roach, AGACNP-BC [facility]  See Amion for personal pager PCCM on call pager [phone] until 7pm. Please call Elink 7p-7a. [phone]  08/02/2023 11:54 AM

## 2023-08-02 NOTE — Progress Notes (Signed)
PHARMACY - ANTICOAGULATION CONSULT NOTE  Pharmacy Consult for heparin Indication: DVT  No Known Allergies  Patient Measurements: Height: 5\' 7"  (170.2 cm) Weight: 73.8 kg (162 lb 11.2 oz) IBW/kg (Calculated) : 66.1 Heparin Dosing Weight: TBW (74 kg)  Vital Signs: Temp: 98 F (36.7 C) (11/04 0330) Temp Source: Axillary (11/04 0330) BP: 121/79 (11/04 1300) Pulse Rate: 143 (11/04 1345)  Labs: Recent Labs    07/31/23 0358 08/01/23 0331 08/02/23 0410  HGB 7.8* 7.6* 7.0*  HCT 26.2* 25.2* 23.5*  PLT 229 304 312  CREATININE 3.14* 4.54* 6.02*    Estimated Creatinine Clearance: 14.8 mL/min (A) (by C-G formula based on SCr of 6.02 mg/dL (H)).   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  Has required multiple blood transfusions (4 units total, last 10/28 for Hgb 6.6).  Pharmacy consulted to dose heparin.  No anticoagulation PTA.    Hgb 7.0, pltc 312.    Goal of Therapy:  Heparin level 0.3-0.7 units/ml Monitor platelets by anticoagulation protocol: Yes   Plan:  Heparin bolus 4000 units x1 (reduced bolus in setting of anemia) Start heparin 1200 units/hr 8 hour heparin level Daily heparin level, CBC while on heparin  Trixie Rude,  PharmD Clinical Pharmacist 08/02/2023  2:25 PM

## 2023-08-02 NOTE — Progress Notes (Signed)
[facility] Infectious Diseases Follow Up Note  Patient Identification: Patient Name: [name] MRN: [id number] Admit Date: 07/09/2023  9:32 PM Age: 42 y.o.Today's Date: 08/02/2023  Reason for Visit: Fevers, leukocytosis  Antibiotics: Vancomycin   Lines/Hardwares: Left femoral tunneled HD catheter right femoral CVC, right femoral art line  Interval Events: Intermittently febrile for the last several days.  Down to 17.9 today.  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 On flexiseal.  # ESRD on HD   Recommendations - Continue Vancomycin, pharmacy to dose  - 2 sets of repeat blood cx ordered given intermittent fevers with multiple lines and may need to be replaced/removed as able. Will also need to consider pan CT scan to  r/o any metastatic sites if persistently febrile - DVT management per primary  - Fu  US abdomen  - Monitor CBC, CMP - Primary team is attempting to transfer to tertiary care center given high risk surgical candidate per recommendations from Cardiology and CVTS - Following   Rest of the management as per the primary team. Thank you for the consult. Please page with pertinent questions or concerns.  ______________________________________________________________________ Subjective patient seen and examined at the bedside. He appears somewhat drowsy.  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 Filetted toe flap metatarsal resection. Debridement Plantar Foot wound;  Surgeon: Park Liter, DPM;  Location: [facility] OR;  Service: Podiatry;  Laterality: Right;   AMPUTATION Left 08/20/2018   Procedure: FIFTH METATARSAL BONE BIOPSY;  Surgeon: Park Liter, DPM;  [facility] OR;  Service: Podiatry;  Laterality: Left;   AMPUTATION Left 10/28/2018   Procedure: LEFT GREAT TOE AMPUTATION;  Surgeon: Park Liter, DPM;  [facility] OR;  Service: Podiatry;  Laterality: Left;   AMPUTATION Left 09/16/2020   Procedure: AMPUTATION BELOW KNEE;  Surgeon: Larina Earthly, MD;  Location: [facility] OR;  Service: Vascular;  Laterality: Left;   AMPUTATION Left 10/15/2020   Procedure: AMPUTATION ABOVE KNEE;  Surgeon: Sherren Kerns, MD;  Location: [facility] OR;  Service: Vascular;  Laterality: Left;   APPLICATION OF WOUND VAC  02/02/2018   Procedure: APPLICATION OF WOUND VAC  Right Foot;  Surgeon: Park Liter, DPM;  [facility]: [facility] OR;  Service: Podiatry;;   APPLICATION OF WOUND VAC Left 10/28/2018   Procedure: APPLICATION OF WOUND VAC LEFT TOE;  Surgeon: Park Liter, DPM;  [facility] OR;  Service: Podiatry;  Laterality: Left;   APPLICATION OF WOUND VAC Left 11/01/2018   Procedure: APPLICATION OF WOUND VAC;  Surgeon: Park Liter, DPM;  [facility] OR;  Service:  Podiatry;  Laterality: Left;   AV FISTULA PLACEMENT     left arm.   AV FISTULA PLACEMENT Right 12/22/2016   Procedure: INSERTION OF ARTERIOVENOUS (AV) GORE-TEX GRAFT ARM;  Surgeon: Sherren Kerns, MD;  Location: [facility] OR;  Service: Vascular;   Laterality: Right;   AV FISTULA PLACEMENT Left 05/26/2018   Procedure: INSERTION OF  ARTERIOVENOUS (AV) GORE-TEX GRAFT LEFT ARM;  Surgeon: Nada Libman, MD;  Location[facility] OR;  Service: Vascular;  Laterality: Left;   EYE SURGERY     I & D EXTREMITY Right 10/31/2017   Procedure: IRRIGATION AND DEBRIDEMENT RIGHT FOOT;  Surgeon: Park Liter, DPM;  [facility];  Service: Podiatry;  Laterality: Right;   I & D EXTREMITY Left 08/20/2018   Procedure: IRRIGATION AND DEBRIDEMENT EXTREMITY WITH SECONDARY WOUND CLOSUREAND APPLICATION OF WOUND VAC LEFT FOOT;  Surgeon: Park Liter, DPM;  [facility] OR;  Service: Podiatry;  Laterality: Left;   I & D EXTREMITY Left 10/20/2018   Procedure: IRRIGATION AND DEBRIDEMENT LEFT FOOT  DEBRIDEMENT LATERAL FOOT WOUND;  Surgeon: Park Liter, DPM;  [facility];  Service: Podiatry;  Laterality: Left;   I & D EXTREMITY Left 10/28/2018   Procedure: IRRIGATION AND DEBRIDEMENT LEFT TOE;  Surgeon: Park Liter, DPM;  [facility];  Service: Podiatry;  Laterality: Left;   I & D EXTREMITY Left 09/14/2020   Procedure: IRRIGATION AND DEBRIDEMENT WRIST;  Surgeon: Betha Loa, MD;  Location[facility] OR;  Service: Orthopedics;  Laterality: Left;   INSERTION OF DIALYSIS CATHETER     Right subclavian   IR AV DIALY SHUNT INTRO NEEDLE/INTRACATH INITIAL W/PTA/IMG RIGHT Right 02/05/2018   IR FLUORO GUIDE CV LINE LEFT  04/14/2023   IR FLUORO GUIDE CV LINE LEFT  07/16/2023   IR FLUORO GUIDE CV LINE RIGHT  01/31/2020   IR FLUORO GUIDE CV LINE RIGHT  01/30/2022   IR PTA VENOUS EXCEPT DIALYSIS CIRCUIT  01/30/2022   IR REMOVAL TUN CV CATH W/O FL  04/12/2023   IR THROMBECTOMY AV FISTULA W/THROMBOLYSIS/PTA INC/SHUNT/IMG LEFT Left 08/24/2018   IR THROMBECTOMY AV FISTULA W/THROMBOLYSIS/PTA INC/SHUNT/IMG LEFT Left 01/06/2019   IR US GUIDE VASC ACCESS LEFT  08/24/2018   IR US GUIDE VASC ACCESS LEFT  04/14/2023   IR US GUIDE VASC ACCESS LEFT  07/16/2023   IR US GUIDE VASC ACCESS LEFT   07/16/2023   IR US GUIDE VASC ACCESS RIGHT  02/05/2018   IR US GUIDE VASC ACCESS RIGHT  01/31/2020   IR US GUIDE VASC ACCESS RIGHT  07/16/2023   IR VENO/JUGULAR LEFT  07/16/2023   IR VENO/JUGULAR RIGHT  07/16/2023   IR VENOCAVAGRAM SVC  01/30/2022   IRRIGATION AND DEBRIDEMENT FOOT Right 10/23/2018   Procedure: Irrigation and Debridement to tendon, Left Foot;  Surgeon: Park Liter, DPM;  [facility];  Service: Podiatry;  Laterality: Right;   IRRIGATION AND DEBRIDEMENT FOOT Left 11/01/2018   Procedure: IRRIGATION AND DEBRIDEMENT PARTIAL WOUND CLOSURE LOCAL TISSUE TRANSFER AND FLAP ROTATION, LEFT FOOT;  Surgeon: Park Liter, DPM;  [facility];  Service: Podiatry;  Laterality: Left;   TRANSESOPHAGEAL ECHOCARDIOGRAM (CATH LAB) N/A 07/14/2023   Procedure: TRANSESOPHAGEAL ECHOCARDIOGRAM;  Surgeon: Thomasene Ripple, DO;  [facility] INVASIVE CV LAB;  Service: Cardiovascular;  Laterality: N/A;   TRANSMETATARSAL AMPUTATION N/A 08/18/2018   Procedure: IRRIGATION AND DEBRIDEMENT OF LEFT 5TH TOE AND TRANSMETATARSAL, WITH PARTICAL LEFT 5TH TOE AND METATARSAL AMPUTATION, BONE BIOPSY, WOUND VAC APPLICATION.;  Surgeon:  Park Liter, DPM;  [facility] OR;  Service: Podiatry;  Laterality: N/A;   UPPER EXTREMITY VENOGRAPHY N/A 11/16/2016   Procedure: Upper Extremity Venography - Right Central;  Surgeon: Sherren Kerns, MD;  Location: [facility] INVASIVE CV LAB;  Service: Cardiovascular;  Laterality: N/A;   UPPER EXTREMITY VENOGRAPHY N/A 05/25/2018   Procedure: UPPER EXTREMITY VENOGRAPHY - Bilateral;  Surgeon: Cephus Shelling, MD;  [facility] INVASIVE CV LAB;  Service: Cardiovascular;  Laterality: N/A;   Vitals BP 103/71   Pulse (!) 135   Temp 98 F (36.7 C) (Axillary)   Resp 14   Ht 5\' 7"  (1.702 m)   Wt 73.8 kg   SpO2 98%   BMI 25.48 kg/m     Physical Exam Constitutional: Chronically ill-appearing adult male, appears older than stated age    Comments: HEENT WNL  Cardiovascular:     Rate  and Rhythm: Normal rate and regular rhythm.     Heart sounds:   Pulmonary:     Effort: Pulmonary effort is normal     Comments: Decreased air entry in the bases  Abdominal:     Palpations: Abdomen is soft.     Tenderness: Nondistended  Musculoskeletal:        General: No swelling or tenderness.  Left AKA site with no signs of infection  Skin:    Comments: No rashes, swelling in left arm. Per RN, no wounds in back concerning for infection   Neurological:     General: He is not interactive and per RN he has received Ativ  Pertinent Microbiology Results for orders placed or performed during the hospital encounter of 07/09/23  Culture, blood (routine x 2)     Status: Abnormal   Collection Time: 07/10/23 12:15 AM   Specimen: BLOOD  Result Value Ref Range Status   Specimen Description BLOOD BLOOD RIGHT ARM  Final   Special Requests   Final    BOTTLES DRAWN AEROBIC AND ANAEROBIC Blood Culture results may not be optimal due to an excessive volume of blood received in culture bottles   Culture  Setup Time   Final    GRAM POSITIVE COCCI IN CLUSTERS IN BOTH AEROBIC AND ANAEROBIC BOTTLES CRITICAL RESULT CALLED TO, READ BACK BY AND VERIFIED WITH: Ebony Cargo [id number] AT 1234 BY EC Performed at [facility] Lab, [address] [place], [name] [address]    Culture METHICILLIN RESISTANT STAPHYLOCOCCUS AUREUS (A)  Final   Report Status 07/12/2023 FINAL  Final   Organism ID, Bacteria METHICILLIN RESISTANT STAPHYLOCOCCUS AUREUS  Final      Susceptibility   Methicillin resistant staphylococcus aureus - MIC*    CIPROFLOXACIN >=8 RESISTANT Resistant     ERYTHROMYCIN >=8 RESISTANT Resistant     GENTAMICIN <=0.5 SENSITIVE Sensitive     OXACILLIN >=4 RESISTANT Resistant     TETRACYCLINE <=1 SENSITIVE Sensitive     VANCOMYCIN 1 SENSITIVE Sensitive     TRIMETH/SULFA <=10 SENSITIVE Sensitive     CLINDAMYCIN <=0.25 SENSITIVE Sensitive     RIFAMPIN <=0.5 SENSITIVE Sensitive     Inducible  Clindamycin NEGATIVE Sensitive     LINEZOLID 2 SENSITIVE Sensitive     * METHICILLIN RESISTANT STAPHYLOCOCCUS AUREUS  Blood Culture ID Panel (Reflexed)     Status: Abnormal   Collection Time: 07/10/23 12:15 AM  Result Value Ref Range Status   Enterococcus faecalis NOT DETECTED NOT DETECTED Final   Enterococcus Faecium NOT DETECTED NOT DETECTED Final   Listeria monocytogenes NOT DETECTED  NOT DETECTED Final   Staphylococcus species DETECTED (A) NOT DETECTED Final    Comment: CRITICAL RESULT CALLED TO, READ BACK BY AND VERIFIED WITH: PHARMD JENNY ZHOU [id number] AT 1234 BY EC    Staphylococcus aureus (BCID) DETECTED (A) NOT DETECTED Final    Comment: Methicillin (oxacillin)-resistant Staphylococcus aureus (MRSA). MRSA is predictably resistant to beta-lactam antibiotics (except ceftaroline). Preferred therapy is vancomycin unless clinically contraindicated. Patient requires contact precautions if  hospitalized. CRITICAL RESULT CALLED TO, READ BACK BY AND VERIFIED WITH: PHARMD JENNY ZHOU [id number] AT 1234 BY EC    Staphylococcus epidermidis NOT DETECTED NOT DETECTED Final   Staphylococcus lugdunensis NOT DETECTED NOT DETECTED Final   Streptococcus species NOT DETECTED NOT DETECTED Final   Streptococcus agalactiae NOT DETECTED NOT DETECTED Final   Streptococcus pneumoniae NOT DETECTED NOT DETECTED Final   Streptococcus pyogenes NOT DETECTED NOT DETECTED Final   A.calcoaceticus-baumannii NOT DETECTED NOT DETECTED Final   Bacteroides fragilis NOT DETECTED NOT DETECTED Final   Enterobacterales NOT DETECTED NOT DETECTED Final   Enterobacter cloacae complex NOT DETECTED NOT DETECTED Final   Escherichia coli NOT DETECTED NOT DETECTED Final   Klebsiella aerogenes NOT DETECTED NOT DETECTED Final   Klebsiella oxytoca NOT DETECTED NOT DETECTED Final   Klebsiella pneumoniae NOT DETECTED NOT DETECTED Final   Proteus species NOT DETECTED NOT DETECTED Final   Salmonella species NOT DETECTED NOT DETECTED  Final   Serratia marcescens NOT DETECTED NOT DETECTED Final   Haemophilus influenzae NOT DETECTED NOT DETECTED Final   Neisseria meningitidis NOT DETECTED NOT DETECTED Final   Pseudomonas aeruginosa NOT DETECTED NOT DETECTED Final   Stenotrophomonas maltophilia NOT DETECTED NOT DETECTED Final   Candida albicans NOT DETECTED NOT DETECTED Final   Candida auris NOT DETECTED NOT DETECTED Final   Candida glabrata NOT DETECTED NOT DETECTED Final   Candida krusei NOT DETECTED NOT DETECTED Final   Candida parapsilosis NOT DETECTED NOT DETECTED Final   Candida tropicalis NOT DETECTED NOT DETECTED Final   Cryptococcus neoformans/gattii NOT DETECTED NOT DETECTED Final   Meth resistant mecA/C and MREJ DETECTED (A) NOT DETECTED Final    Comment: CRITICAL RESULT CALLED TO, READ BACK BY AND VERIFIED WITH: Valarie Merino ZHOU [id number] AT 1234 BY EC Performed at [facility], [address] [place], [name] [address]   Culture, blood (routine x 2)     Status: Abnormal   Collection Time: 07/10/23 12:16 AM   Specimen: BLOOD  Result Value Ref Range Status   Specimen Description BLOOD BLOOD RIGHT HAND  Final   Special Requests   Final    BOTTLES DRAWN AEROBIC AND ANAEROBIC Blood Culture results may not be optimal due to an excessive volume of blood received in culture bottles   Culture  Setup Time   Final    GRAM POSITIVE COCCI IN CLUSTERS IN BOTH AEROBIC AND ANAEROBIC BOTTLES CRITICAL VALUE NOTED.  VALUE IS CONSISTENT WITH PREVIOUSLY REPORTED AND CALLED VALUE.    Culture (A)  Final    STAPHYLOCOCCUS AUREUS SUSCEPTIBILITIES PERFORMED ON PREVIOUS CULTURE WITHIN THE LAST 5 DAYS. Performed at [facility] Lab, [address] [place], [name] [address]    Report Status 07/12/2023 FINAL  Final  SARS Coronavirus 2 by RT PCR (hospital order, performed in [facility] hospital lab) *cepheid single result test*     Status: None   Collection Time: 07/10/23 12:17 AM   Specimen: Nasal Swab  Result Value  Ref Range Status   SARS Coronavirus 2 by RT PCR  NEGATIVE NEGATIVE Final    Comment: Performed at [facility] Lab, [address], [place], [name] [address]  MRSA Next Gen by PCR, Nasal     Status: Abnormal   Collection Time: 07/10/23  3:26 AM   Specimen: Nasal Mucosa; Nasal Swab  Result Value Ref Range Status   MRSA by PCR Next Gen DETECTED (A) NOT DETECTED Final    Comment: RESULT CALLED TO, READ BACK BY AND VERIFIED WITH: B CUMMINGS,RN@0646  07/10/23 MK (NOTE) The GeneXpert MRSA Assay (FDA approved for NASAL specimens only), is one component of a comprehensive MRSA colonization surveillance program. It is not intended to diagnose MRSA infection nor to guide or monitor treatment for MRSA infections. Test performance is not FDA approved in patients less than 36 years old. Performed at [facility] Lab, [address] [place], [name] [address]   C Difficile Quick Screen w PCR reflex     Status: Abnormal   Collection Time: 07/10/23  8:32 AM   Specimen: STOOL  Result Value Ref Range Status   C Diff antigen POSITIVE (A) NEGATIVE Final   C Diff toxin NEGATIVE NEGATIVE Final   C Diff interpretation Results are indeterminate. See PCR results.  Final    Comment: Performed at [facility] Lab, [address], [place], [name] [address]  C. Diff by PCR, Reflexed     Status: None   Collection Time: 07/10/23  8:32 AM  Result Value Ref Range Status   Toxigenic C. Difficile by PCR NEGATIVE NEGATIVE Final    Comment: Patient is colonized with non toxigenic C. difficile. May not need treatment unless significant symptoms are present. Performed at [facility] Lab, [address] [place], [name] [address]   Culture, blood (Routine X 2) w Reflex to ID Panel     Status: None   Collection Time: 07/11/23  8:21 AM   Specimen: BLOOD LEFT ARM  Result Value Ref Range Status   Specimen Description BLOOD LEFT ARM  Final   Special Requests   Final    BOTTLES DRAWN AEROBIC AND ANAEROBIC Blood  Culture adequate volume   Culture   Final    NO GROWTH 5 DAYS Performed at [facility] Lab, [address] [place], [name] [address]    Report Status 07/16/2023 FINAL  Final  Culture, blood (Routine X 2) w Reflex to ID Panel     Status: None   Collection Time: 07/11/23  8:44 AM   Specimen: BLOOD RIGHT ARM  Result Value Ref Range Status   Specimen Description BLOOD RIGHT ARM  Final   Special Requests   Final    BOTTLES DRAWN AEROBIC ONLY Blood Culture adequate volume   Culture   Final    NO GROWTH 5 DAYS Performed at [facility] Lab, [address] [name], [name] [address]    Report Status 07/16/2023 FINAL  Final  Culture, blood (Routine X 2) w Reflex to ID Panel     Status: None   Collection Time: 07/15/23  3:00 PM   Specimen: BLOOD RIGHT ARM  Result Value Ref Range Status   Specimen Description BLOOD RIGHT ARM  Final   Special Requests   Final    BOTTLES DRAWN AEROBIC AND ANAEROBIC Blood Culture adequate volume   Culture   Final    NO GROWTH 5 DAYS Performed at [facility] Lab, [address], [place], [name] [address]    Report Status 07/20/2023 FINAL  Final  Culture,  blood (Routine X 2) w Reflex to ID Panel     Status: None   Collection Time: 07/15/23  3:02 PM   Specimen: BLOOD RIGHT ARM  Result Value Ref Range Status   Specimen Description BLOOD RIGHT ARM  Final   Special Requests   Final    BOTTLES DRAWN AEROBIC AND ANAEROBIC Blood Culture adequate volume   Culture   Final    NO GROWTH 5 DAYS Performed at [facility] Lab, [address], [place], [name] [address]    Report Status 07/20/2023 FINAL  Final  Culture, Respiratory w Gram Stain     Status: None   Collection Time: 07/19/23 12:55 AM   Specimen: Tracheal Aspirate; Respiratory  Result Value Ref Range Status   Specimen Description TRACHEAL ASPIRATE  Final   Special Requests NONE  Final   Gram Stain   Final    FEW WBC PRESENT, PREDOMINANTLY PMN FEW GRAM POSITIVE COCCI FEW GRAM POSITIVE  RODS    Culture   Final    FEW Consistent with normal respiratory flora. No Pseudomonas species isolated Performed at [facility] Lab, [address], [place], [name] [address]    Report Status 07/21/2023 FINAL  Final  Culture, blood (Routine X 2) w Reflex to ID Panel     Status: None   Collection Time: 07/23/23 10:13 PM   Specimen: BLOOD LEFT ARM  Result Value Ref Range Status   Specimen Description BLOOD LEFT ARM  Final   Special Requests   Final    BOTTLES DRAWN AEROBIC AND ANAEROBIC Blood Culture adequate volume   Culture   Final    NO GROWTH 5 DAYS Performed at [facility] Lab, [address], [place], [name] [address]    Report Status 07/28/2023 FINAL  Final  Culture, blood (Routine X 2) w Reflex to ID Panel     Status: None   Collection Time: 07/23/23 10:13 PM   Specimen: BLOOD LEFT ARM  Result Value Ref Range Status   Specimen Description BLOOD LEFT ARM  Final   Special Requests   Final    BOTTLES DRAWN AEROBIC AND ANAEROBIC Blood Culture results may not be optimal due to an excessive volume of blood received in culture bottles   Culture   Final    NO GROWTH 5 DAYS Performed at [facility] Lab, [address], [place], [name] [address]    Report Status 07/28/2023 FINAL  Final   Pertinent Lab.    Latest Ref Rng & Units 08/02/2023    4:10 AM 08/01/2023    3:31 AM 07/31/2023    3:58 AM  CBC  WBC 4.0 - 10.5 K/uL 17.9  20.0  17.1   Hemoglobin 13.0 - 17.0 g/dL 7.0  7.6  7.8   Hematocrit 39.0 - 52.0 % 23.5  25.2  26.2   Platelets 150 - 400 K/uL 312  304  229       Latest Ref Rng & Units 08/02/2023    4:10 AM 08/01/2023    3:31 AM 07/31/2023    3:58 AM  CMP  Glucose 70 - 99 mg/dL 295  621  308   BUN 6 - 20 mg/dL 657  80  45   Creatinine 0.61 - 1.24 mg/dL 8.46  9.62  9.52   Sodium 135 - 145 mmol/L 134  133  133   Potassium 3.5 - 5.1 mmol/L 5.5  5.5  4.9   Chloride 98 - 111 mmol/L 90  92  94   CO2 22 - 32 mmol/L 29  28  26    Calcium 8.9 - 10.3 mg/dL 9.2   8.8  8.3      Pertinent Imaging today Plain films and CT images have been personally visualized and interpreted; radiology reports have been reviewed. Decision making incorporated into the Impression /   VAS [place] UPPER EXTREMITY VENOUS DUPLEX  Result Date: 08/02/2023 UPPER VENOUS STUDY  Patient Name:  KAYLEE WOMBLES  Date of Exam:   08/02/2023 Medical Rec #: [id number]      Accession #:    [id number] Date of Birth: 12-26-1980     Patient Gender: M Patient Age:   36 years Exam Location:  [facility] Procedure:      VAS [place] UPPER EXTREMITY VENOUS DUPLEX Referring Phys: Renae Fickle HOFFMAN --------------------------------------------------------------------------------  Indications: Swelling Risk Factors: None identified. Limitations: Poor ultrasound/tissue interface and patient positioning. Comparison Study: No prior studies. Performing Technologist: Chanda Busing RVT  Examination Guidelines: A complete evaluation includes B-mode imaging, spectral Doppler, color Doppler, and power Doppler as needed of all accessible portions of each vessel. Bilateral testing is considered an integral part of a complete examination. Limited examinations for reoccurring indications may be performed as noted.  Right Findings: +----------+------------+---------+-----------+----------+-------+ RIGHT     CompressiblePhasicitySpontaneousPropertiesSummary +----------+------------+---------+-----------+----------+-------+ Subclavian    Full       Yes       Yes                      +----------+------------+---------+-----------+----------+-------+  Left Findings: +----------+------------+---------+-----------+----------+-------+ LEFT      CompressiblePhasicitySpontaneousPropertiesSummary +----------+------------+---------+-----------+----------+-------+ IJV           None       No        No                Acute  +----------+------------+---------+-----------+----------+-------+ Subclavian    None       No         No                Acute  +----------+------------+---------+-----------+----------+-------+ Axillary      None       No        No                Acute  +----------+------------+---------+-----------+----------+-------+ Brachial      Full                                          +----------+------------+---------+-----------+----------+-------+ Radial        Full                                          +----------+------------+---------+-----------+----------+-------+ Ulnar         Full                                          +----------+------------+---------+-----------+----------+-------+ Cephalic      Full                                          +----------+------------+---------+-----------+----------+-------+  Basilic       Full                                          +----------+------------+---------+-----------+----------+-------+  Summary:  Right: No evidence of thrombosis in the subclavian.  Left: No evidence of superficial vein thrombosis in the upper extremity. Findings consistent with acute deep vein thrombosis involving the left internal jugular vein, left subclavian vein and left axillary vein.  *See table(s) above for measurements and observations.    Preliminary    VAS [place] LOWER EXTREMITY VENOUS (DVT)  Result Date: 07/31/2023  Lower Venous DVT Study Patient Name:  JAYVEON CONVEY  Date of Exam:   07/30/2023 Medical Rec #: [id number]      Accession #:    [id number] Date of Birth: 1980/10/11     Patient Gender: M Patient Age:   46 years Exam Location:  [facility] Procedure:      VAS [place] LOWER EXTREMITY VENOUS (DVT) Referring Phys: Zenia Resides --------------------------------------------------------------------------------  Indications: Pain.  Risk Factors: Surgery Trauma. Limitations: Bandages, line and R femoral HD access. Comparison Study: No prior studies. Performing Technologist: Chanda Busing RVT  Examination Guidelines: A complete  evaluation includes B-mode imaging, spectral Doppler, color Doppler, and power Doppler as needed of all accessible portions of each vessel. Bilateral testing is considered an integral part of a complete examination. Limited examinations for reoccurring indications may be performed as noted. The reflux portion of the exam is performed with the patient in reverse Trendelenburg.  +---------+---------------+---------+-----------+----------+-------------------+ RIGHT    CompressibilityPhasicitySpontaneityPropertiesThrombus Aging      +---------+---------------+---------+-----------+----------+-------------------+ CFV      Full           Yes      Yes                                      +---------+---------------+---------+-----------+----------+-------------------+ SFJ      Full                                                             +---------+---------------+---------+-----------+----------+-------------------+ FV Prox                                               Not well visualized +---------+---------------+---------+-----------+----------+-------------------+ FV Mid   Full                                                             +---------+---------------+---------+-----------+----------+-------------------+ FV DistalFull           Yes      Yes                                      +---------+---------------+---------+-----------+----------+-------------------+ PFV  Full                                                             +---------+---------------+---------+-----------+----------+-------------------+ POP      Full           Yes      Yes                                      +---------+---------------+---------+-----------+----------+-------------------+ PTV      Full                                                             +---------+---------------+---------+-----------+----------+-------------------+ PERO     Full                                                              +---------+---------------+---------+-----------+----------+-------------------+   +----+---------------+---------+-----------+----------+--------------+ LEFTCompressibilityPhasicitySpontaneityPropertiesThrombus Aging +----+---------------+---------+-----------+----------+--------------+ CFV Full           Yes      Yes                                 +----+---------------+---------+-----------+----------+--------------+ SFJ Full                                                        +----+---------------+---------+-----------+----------+--------------+    Summary: RIGHT: - There is no evidence of deep vein thrombosis in the lower extremity. However, portions of this examination were limited- see technologist comments above.  - No cystic structure found in the popliteal fossa.  LEFT: - No evidence of common femoral vein obstruction.   *See table(s) above for measurements and observations. Electronically signed by Coral Else MD on 07/31/2023 at 10:50:42 AM.    Final    DG Chest Port 1 View  Result Date: 07/28/2023 CLINICAL DATA:  Acute respiratory failure.  MRSA precaution. EXAM: PORTABLE CHEST 1 VIEW COMPARISON:  07/21/2023 FINDINGS: The previous enteric and endotracheal tubes have been removed. An enteric feeding tube has been replaced. Tip is off the field of view but below the left hemidiaphragm. Vascular stent in the right upper mediastinum and in the left axilla. Cardiac enlargement. Thick-walled cavitary masslike lesion in the left upper lung measuring 4.4 cm diameter. The wall is increasing in thickness since the prior study. This finding developed between the 07/10/2023 and 07/18/2023 images, likely representing an area of necrotizing pneumonia or pulmonary abscess. Follow-up to resolution is recommended to exclude underlying pulmonary neoplasm. Right lung is clear. No pleural effusions. No pneumothorax. IMPRESSION: 1. Cardiac  enlargement. 2. Thick-walled cavitary lesion in the left upper lung is progressing since prior  study. This is probably an area of necrotizing pneumonia or abscess. Follow-up to resolution recommended to exclude underlying pulmonary neoplasm. Electronically Signed   By: Burman Nieves M.D.   On: 07/28/2023 23:27   DG Abd Portable 1V  Result Date: 07/23/2023 CLINICAL DATA:  [id number] Encounter for feeding tube placement [id number] EXAM: PORTABLE ABDOMEN - 1 VIEW COMPARISON:  None Available. FINDINGS: Lung bases are clear. Weighted enteric tube courses below diaphragm with the tip terminating near the pylorus. Paucity of small bowel gas limits the ability to assess for obstruction. No dilated loops of bowel are visualized. No acute osseous abnormality. IMPRESSION: Weighted enteric tube courses below diaphragm with the tip terminating near the pylorus. Electronically Signed   By: Lorenza Cambridge M.D.   On: 07/23/2023 12:31   CT HEAD WO CONTRAST ( )  Result Date: 07/22/2023 CLINICAL DATA:  Altered mental status and concern for septic emboli. EXAM: CT HEAD WITHOUT CONTRAST TECHNIQUE: Contiguous axial images were obtained from the base of the skull through the vertex without intravenous contrast. RADIATION DOSE REDUCTION: This exam was performed according to the departmental dose-optimization program which includes automated exposure control, adjustment of the mA and/or kV according to patient size and/or use of iterative reconstruction technique. COMPARISON:  07/09/2023 head CT and 07/15/2023 brain MRI FINDINGS: Brain: Expected evolution of cytotoxic edema at the site of right MCA territory infarct. No acute hemorrhage. No midline shift or other mass effect. Vascular: Focal calcification within a M2 branch of the right MCA, likely calcified thromboembolism. Skull: The visualized skull base, calvarium and extracranial soft tissues are normal. Sinuses/Orbits: Opacification of the left maxillary sinus. The orbits are  normal. No mastoid or middle ear effusion. IMPRESSION: 1. Expected evolution of right MCA territory infarct. No acute hemorrhage. Electronically Signed   By: Deatra Robinson M.D.   On: 07/22/2023 03:51   DG CHEST PORT 1 VIEW  Result Date: 07/21/2023 CLINICAL DATA:  42 year old male respiratory distress.  Intubated. EXAM: PORTABLE CHEST 1 VIEW COMPARISON:  Portable chest 07/19/2023 and earlier. FINDINGS: Portable AP supine view at talar 9 hours. Enteric tube tip in good position between the clavicles and carina. Enteric tube side hole at the level of the gastric body. Left axillary vascular stent again noted. There is a thick-walled cavitary lesion projecting in the lateral left upper lobe at the level of the hilum, progressed since 07/10/2023. This is 3-4 cm. No superimposed pneumothorax, pulmonary edema, pleural effusion or other confluent opacity. IMPRESSION: 1. Satisfactory ET tube and enteric tube. 2. A 3-4 cm thick walled round and cavitary left mid lung lesion has progressed since 07/10/2023. Consider pulmonary abscess, necrotizing pneumonia. No other confluent lung opacity. Electronically Signed   By: Odessa Fleming M.D.   On: 07/21/2023 12:28   ECHO TEE  Result Date: 07/20/2023    TRANSESOPHOGEAL ECHO REPORT   Patient Name:   JAMARIUS SAHA Date of Exam: 07/20/2023 Medical Rec #:  [id number]     Height:       67.0 in Accession #:    [id number]    Weight:       154.8 lb Date of Birth:  09/10/1981    BSA:          1.813 m Patient Age:    41 years      BP:           123/63 mmHg Patient Gender: M             HR:  89 bpm. Exam Location:  Inpatient Procedure: Transesophageal Echo, 3D Echo, Color Doppler and Cardiac Doppler Indications:     Endocarditis  History:         Patient has prior history of Echocardiogram examinations, most                  recent 07/14/2023. CHF; Risk Factors:Hypertension and Diabetes.  Sonographer:     Irving Burton Senior RDCS Referring Phys:  [phone] MIHAI CROITORU Diagnosing Phys: Thurmon Fair MD PROCEDURE: After discussion of the risks and benefits of a TEE, an informed consent was obtained from the patient. The transesophogeal probe was passed without difficulty through the esophogus of the patient. Sedation performed by performing physician. The patient developed no complications during the procedure.  IMPRESSIONS  1. Left ventricular ejection fraction, by estimation, is 35%. The left ventricle has moderately decreased function. The left ventricle demonstrates global hypokinesis. There is mild concentric left ventricular hypertrophy.  2. Right ventricular systolic function is normal. The right ventricular size is normal. There is mildly elevated pulmonary artery systolic pressure.  3. No left atrial/left atrial appendage thrombus was detected.  4. 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 It measures up to 3.9 cm in length and 1 cm in width and is highly mobile, prolapsing deep in the LV in diastole and reaching the posterior  left atrial wall in systole. There is an irregular perforation of the P1 scallop with a diameter of at least 4 mm with moderate -severe insufficiency. By both 2D and 3D PISA based measurements the effective regurgitant orifice area is 0.35 cm sq, regurgitant volume 55 ml, regurgitant fraction 60%. The mitral valve is abnormal. Moderate to severe mitral valve regurgitation. No evidence of mitral stenosis.  5. Tricuspid valve regurgitation is mild to moderate.  6. The right coronary cusp is disproportionately affected by degenerative changes. No mobile vegetations a re seen. The aortic valve is tricuspid. There is mild calcification of the aortic valve. There is mild thickening of the aortic valve. Aortic valve regurgitation is trivial. Aortic valve sclerosis/calcification is present, without any evidence of aortic stenosis.  7. There is mild (Grade II) atheroma plaque involving the ascending aorta.  8.  There is brief systolic reversal of flow only in the right upper pulmonary vein. There is antegrade systolic flow in the left upper pulmonary vein. FINDINGS  Left Ventricle: Left ventricular ejection fraction, by estimation, is 35%. The left ventricle has moderately decreased function. The left ventricle demonstrates global hypokinesis. The left ventricular internal cavity size was normal in size. There is mild concentric left ventricular hypertrophy. Right Ventricle: The right ventricular size is normal. No increase in right ventricular wall thickness. Right ventricular systolic function is normal. There is mildly elevated pulmonary artery systolic pressure. The tricuspid regurgitant velocity is 2.59  m/s, and with an assumed right atrial pressure of 10 mmHg, the estimated right ventricular systolic pressure is 36.8 mmHg. Left Atrium: Left atrial size was normal in size. No left atrial/left atrial appendage thrombus was detected. Right Atrium: Right atrial size was normal in size. Pericardium: There is no evidence of pericardial effusion. 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 It measures up to 3.9 cm in length and 1 cm in width and is highly mobile, prolapsing deep in the LV in diastole and reaching the  posterior left atrial wall in systole.  There is an irregular perforation of the P1 scallop with a diameter of at least 4 mm with moderate -severe insufficiency. By both 2D and 3D PISA based measurements the effective regurgitant orifice area is 0.35 cm sq, regurgitant volume 55 ml, regurgitant fraction 60%. The mitral valve is abnormal. Moderate to severe mitral valve regurgitation. No evidence of mitral valve stenosis. Tricuspid Valve: The tricuspid valve is normal in structure. Tricuspid valve regurgitation is mild to moderate. Aortic Valve: The right coronary cusp is disproportionately affected by degenerative changes. No  mobile vegetations a re seen. The aortic valve is tricuspid. There is mild calcification of the aortic valve. There is mild thickening of the aortic valve. Aortic valve regurgitation is trivial. Aortic valve sclerosis/calcification is present, without any evidence of aortic stenosis. Pulmonic Valve: The pulmonic valve was grossly normal. Pulmonic valve regurgitation is trivial. Aorta: The aortic root, ascending aorta, aortic arch and descending aorta are all structurally normal, with no evidence of dilitation or obstruction. There is mild (Grade II) atheroma plaque involving the ascending aorta. Venous: There is brief systolic reversal of flow only in the right upper pulmonary vein. There is antegrade systolic flow in the left upper pulmonary vein. IVC assessment for right atrial pressure unable to be performed due to mechanical ventilation. IAS/Shunts: No atrial level shunt detected by color flow Doppler. Additional Comments: Spectral Doppler performed. LEFT VENTRICLE PLAX 2D LVOT diam:     2.04 cm LV SV:         37 LV SV Index:   21 LVOT Area:     3.27 cm  AORTIC VALVE LVOT Vmax:   82.35 cm/s LVOT Vmean:  59.251 cm/s LVOT VTI:    0.114 m MR Peak grad:    186.0 mmHg   TRICUSPID VALVE MR Mean grad:    86.0 mmHg    TR Peak grad:   26.8 mmHg MR Vmax:         682.00 cm/s  TR Vmax:        259.00 cm/s MR Vmean:        423.0 cm/s MR PISA:         6.28 cm     SHUNTS MR PISA Eff ROA: 37 mm       Systemic VTI:  0.11 m MR PISA Radius:  1.00 cm      Systemic Diam: 2.04 cm Rachelle Hora Croitoru MD Electronically signed by Thurmon Fair MD Signature Date/Time: 07/20/2023/2:12:46 PM    Final    DG Chest Port 1 View  Result Date: 07/19/2023 CLINICAL DATA:  Endotracheal tube placement EXAM: PORTABLE CHEST 1 VIEW COMPARISON:  07/18/2023 FINDINGS: An endotracheal tube has been placed with tip measuring 6.3 cm above the carina. An enteric tube was placed with tip projecting over the left upper quadrant consistent with location in  the body of the stomach. Cardiac enlargement. Patchy perihilar infiltration mostly on the left. No pleural effusions. No pneumothorax. Mediastinal contours appear intact. Vascular graft in the left axilla and right subclavian region. IMPRESSION: Appliances appear in satisfactory position. Cardiac enlargement. Patchy perihilar infiltrates, greater on the left. Electronically Signed   By: Burman Nieves M.D.   On: 07/19/2023 01:14   DG Chest Port 1 View  Result Date: 07/18/2023 CLINICAL DATA:  Acute respiratory distress.  Left-sided stroke. EXAM: PORTABLE CHEST 1 VIEW COMPARISON:  07/10/2023 FINDINGS: Left central venous catheter has been removed in the interval. Vascular grafts in the axilla and right subclavian region. Shallow inspiration. Mild cardiac enlargement. Patchy infiltrates  in the left mid lung and right lung base, possibly atelectasis or pneumonia. No pleural effusions. No pneumothorax. IMPRESSION: Patchy infiltrates in the left mid lung and right lung base, possibly atelectasis or pneumonia. Cardiac enlargement. Electronically Signed   By: Burman Nieves M.D.   On: 07/18/2023 22:02   VAS US CAROTID  Result Date: 07/16/2023 Carotid Arterial Duplex Study Patient Name:  NAKIA KOBLE  Date of Exam:   07/16/2023 Medical Rec #: [id number]      Accession #:    [id number] Date of Birth: August 22, 1981     Patient Gender: M Patient Age:   34 years Exam Location:  [facility] Procedure:      VAS US CAROTID Referring Phys: AMRIT ADHIKARI --------------------------------------------------------------------------------  Indications:   CVA. Risk Factors:  Hypertension, Diabetes, no history of smoking, prior CVA. 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 Limitations    Today's exam was limited due to the patient's inability or                unwillingness to cooperate, the patient's respiratory variation                and Cath in IJ, unable to visualize right subclavian. Performing Technologist: Dondra Prader RVT, RCS  Examination Guidelines: A complete evaluation includes B-mode imaging, spectral Doppler, color Doppler, and power Doppler as needed of all accessible portions of each vessel. Bilateral testing is considered an integral part of a complete examination. Limited examinations for reoccurring indications may be performed as noted.  Right Carotid Findings: +----------+--------+--------+--------+------------------+--------+           PSV cm/sEDV cm/sStenosisPlaque DescriptionComments +----------+--------+--------+--------+------------------+--------+ CCA Prox  86      18                                         +----------+--------+--------+--------+------------------+--------+ CCA Mid   58      10                                         +----------+--------+--------+--------+------------------+--------+ CCA Distal82      15                                         +----------+--------+--------+--------+------------------+--------+ ICA Prox  69      25      Normal                             +----------+--------+--------+--------+------------------+--------+ ICA Mid   93      33                                         +----------+--------+--------+--------+------------------+--------+ ICA Distal92      28                                         +----------+--------+--------+--------+------------------+--------+ ECA       40                                                 +----------+--------+--------+--------+------------------+--------+ +---------+--------+--+--------+--+---------+  VertebralPSV cm/s45EDV cm/s13Antegrade +---------+--------+--+--------+--+---------+  Left Carotid Findings: +----------+--------+--------+--------+------------------+--------+           PSV cm/sEDV cm/sStenosisPlaque DescriptionComments +----------+--------+--------+--------+------------------+--------+ CCA Prox  120     20                                          +----------+--------+--------+--------+------------------+--------+ CCA Mid   109     21                                         +----------+--------+--------+--------+------------------+--------+ CCA Distal86      11                                         +----------+--------+--------+--------+------------------+--------+ ICA Prox  67      23      Normal                             +----------+--------+--------+--------+------------------+--------+ ICA Mid   100     32                                         +----------+--------+--------+--------+------------------+--------+ ICA Distal89      29                                         +----------+--------+--------+--------+------------------+--------+ ECA       45                                                 +----------+--------+--------+--------+------------------+--------+ +----------+--------+--------+--------+-------------------+           PSV cm/sEDV cm/sDescribeArm Pressure (mmHG) +----------+--------+--------+--------+-------------------+ Subclavian115                                         +----------+--------+--------+--------+-------------------+ +---------+--------+--+--------+--+---------+ VertebralPSV cm/s53EDV cm/s15Antegrade +---------+--------+--+--------+--+---------+   Summary: Right Carotid: There was no evidence of thrombus, dissection, atherosclerotic                plaque or stenosis in the cervical carotid system. Left Carotid: There was no evidence of thrombus, dissection, atherosclerotic               plaque or stenosis in the cervical carotid system. Vertebrals:  Bilateral vertebral arteries demonstrate antegrade flow. Subclavians: Normal flow hemodynamics were seen in the left subclavian artery. *See table(s) above for measurements and observations.  Electronically signed by Sherald Hess MD on 07/16/2023 at 4:24:45 PM.    Final    IR Fluoro Guide CV Line  Left  Result Date: 07/16/2023 INDICATION: ESRD on HD.  Challenging vascular access. EXAM: Title: TUNNELED CENTRAL VENOUS HEMODIALYSIS CATHETER PLACEMENT WITH ULTRASOUND AND FLUOROSCOPIC GUIDANCE Procedures: 1. ULTRASOUND-GUIDED RIGHT JUGULAR, LEFT JUGULAR AND LEFT FEMORAL VENOUS ACCESS 2. RIGHT  JUGULAR VENOGRAPHY 3. LEFT JUGULAR VENOGRAPHY 4. LEFT FEMORAL TUNNELED HEMODIALYSIS CATHETER PLACEMENT MEDICATIONS: Ancef 2 gm IV . The antibiotic was given in an appropriate time interval prior to skin puncture. ANESTHESIA/SEDATION: Moderate (conscious) sedation was employed during this procedure. A total of Versed 1 mg and Fentanyl 50 mcg was administered intravenously. Moderate Sedation Time: 54 minutes. The patient's level of consciousness and vital signs were monitored continuously by radiology nursing throughout the procedure under my direct supervision. FLUOROSCOPY TIME:  Fluoroscopic dose; 61 mGy COMPLICATIONS: None immediate. PROCEDURE: Informed written consent was obtained from the patient and/or patient's representative after a discussion of the risks, benefits, and alternatives to treatment. Questions regarding the procedure were encouraged and answered. The operative sites were prepped with chlorhexidine in a sterile fashion, and a sterile drape was applied covering the operative field. Maximum barrier sterile technique with sterile gowns and gloves were used for the procedure. A timeout was performed prior to the initiation of the procedure. Initial access was obtained at the RIGHT neck, however the microwire would not advance. RIGHT jugular venography was then performed. The RIGHT neck access site was aborted. Access was then obtained at the LEFT neck, however the microwire would not advance into the SVC. LEFT jugular venography was also performed. The LEFT neck access site was also aborted. Sonographic evaluation of the LEFT groin demonstrated a widely patent greater saphenous and femoral veins. After  creating a small venotomy incision, a micropuncture kit was utilized to access the LEFT greater saphenous vein. Real-time ultrasound guidance was utilized for vascular access including the acquisition of a permanent ultrasound image documenting patency of the accessed vessel. The microwire was utilized to measure appropriate catheter length. A stiff Glidewire was advanced to the level of the IVC and the micropuncture sheath was exchanged for a peel-away sheath. A palindrome tunneled hemodialysis catheter measuring 33 cm from tip to cuff was tunneled in a retrograde fashion from the lateral LEFT thigh to the venotomy incision. The catheter was then placed through the peel-away sheath with tips ultimately positioned within the infrarenal IVC. Final catheter positioning was confirmed and documented with a spot radiographic image. The catheter aspirates and flushes normally. The catheter was flushed with appropriate volume heparin dwells. The catheter exit site was secured with a 2-0 Ethilon retention suture. The venotomy incision was closed with Dermabond. Dressings were applied. The patient tolerated the procedure well without immediate post procedural complication. IMPRESSION: 1. Occluded superior vena cava, as demonstrated by RIGHT and LEFT jugular venography. 2. Successful placement of a 33 cm tip to cuff tunneled hemodialysis catheter via the LEFT femoral vein. The tip of the catheter is positioned infrarenal IVC. The catheter is ready for immediate use. RECOMMENDATIONS: Challenging vascular access patient. DO NOT REMOVE THE PATIENT'S HEMODIALYSIS CATHETER without consulting vascular and Interventional Radiology (VIR). Roanna Banning, MD [facility] Electronically Signed   By: Roanna Banning M.D.   On: 07/16/2023 13:43   IR US Guide Vasc Access Left  Result Date: 07/16/2023 INDICATION: ESRD on HD.  Challenging vascular access. EXAM: Title: TUNNELED CENTRAL  VENOUS HEMODIALYSIS CATHETER PLACEMENT WITH ULTRASOUND AND FLUOROSCOPIC GUIDANCE Procedures: 1. ULTRASOUND-GUIDED RIGHT JUGULAR, LEFT JUGULAR AND LEFT FEMORAL VENOUS ACCESS 2. RIGHT JUGULAR VENOGRAPHY 3. LEFT JUGULAR VENOGRAPHY 4. LEFT FEMORAL TUNNELED HEMODIALYSIS CATHETER PLACEMENT MEDICATIONS: Ancef 2 gm IV . The antibiotic was given in an appropriate time interval prior to skin puncture. ANESTHESIA/SEDATION: Moderate (conscious) sedation was employed during this procedure. A total of Versed 1  mg and Fentanyl 50 mcg was administered intravenously. Moderate Sedation Time: 54 minutes. The patient's level of consciousness and vital signs were monitored continuously by radiology nursing throughout the procedure under my direct supervision. FLUOROSCOPY TIME:  Fluoroscopic dose; 61 mGy COMPLICATIONS: None immediate. PROCEDURE: Informed written consent was obtained from the patient and/or patient's representative after a discussion of the risks, benefits, and alternatives to treatment. Questions regarding the procedure were encouraged and answered. The operative sites were prepped with chlorhexidine in a sterile fashion, and a sterile drape was applied covering the operative field. Maximum barrier sterile technique with sterile gowns and gloves were used for the procedure. A timeout was performed prior to the initiation of the procedure. Initial access was obtained at the RIGHT neck, however the microwire would not advance. RIGHT jugular venography was then performed. The RIGHT neck access site was aborted. Access was then obtained at the LEFT neck, however the microwire would not advance into the SVC. LEFT jugular venography was also performed. The LEFT neck access site was also aborted. Sonographic evaluation of the LEFT groin demonstrated a widely patent greater saphenous and femoral veins. After creating a small venotomy incision, a micropuncture kit was utilized to access the LEFT greater saphenous vein. Real-time  ultrasound guidance was utilized for vascular access including the acquisition of a permanent ultrasound image documenting patency of the accessed vessel. The microwire was utilized to measure appropriate catheter length. A stiff Glidewire was advanced to the level of the IVC and the micropuncture sheath was exchanged for a peel-away sheath. A palindrome tunneled hemodialysis catheter measuring 33 cm from tip to cuff was tunneled in a retrograde fashion from the lateral LEFT thigh to the venotomy incision. The catheter was then placed through the peel-away sheath with tips ultimately positioned within the infrarenal IVC. Final catheter positioning was confirmed and documented with a spot radiographic image. The catheter aspirates and flushes normally. The catheter was flushed with appropriate volume heparin dwells. The catheter exit site was secured with a 2-0 Ethilon retention suture. The venotomy incision was closed with Dermabond. Dressings were applied. The patient tolerated the procedure well without immediate post procedural complication. IMPRESSION: 1. Occluded superior vena cava, as demonstrated by RIGHT and LEFT jugular venography. 2. Successful placement of a 33 cm tip to cuff tunneled hemodialysis catheter via the LEFT femoral vein. The tip of the catheter is positioned infrarenal IVC. The catheter is ready for immediate use. RECOMMENDATIONS: Challenging vascular access patient. DO NOT REMOVE THE PATIENT'S HEMODIALYSIS CATHETER without consulting vascular and Interventional Radiology (VIR). Roanna Banning, MD Vascular and Interventional Radiology Specialists [facility] Radiology Electronically Signed   By: Roanna Banning M.D.   On: 07/16/2023 13:43   IR Veno/Jugular Right  Result Date: 07/16/2023 INDICATION: ESRD on HD.  Challenging vascular access. EXAM: Title: TUNNELED CENTRAL VENOUS HEMODIALYSIS CATHETER PLACEMENT WITH ULTRASOUND AND FLUOROSCOPIC GUIDANCE Procedures: 1. ULTRASOUND-GUIDED RIGHT JUGULAR,  LEFT JUGULAR AND LEFT FEMORAL VENOUS ACCESS 2. RIGHT JUGULAR VENOGRAPHY 3. LEFT JUGULAR VENOGRAPHY 4. LEFT FEMORAL TUNNELED HEMODIALYSIS CATHETER PLACEMENT MEDICATIONS: Ancef 2 gm IV . The antibiotic was given in an appropriate time interval prior to skin puncture. ANESTHESIA/SEDATION: Moderate (conscious) sedation was employed during this procedure. A total of Versed 1 mg and Fentanyl 50 mcg was administered intravenously. Moderate Sedation Time: 54 minutes. The patient's level of consciousness and vital signs were monitored continuously by radiology nursing throughout the procedure under my direct supervision. FLUOROSCOPY TIME:  Fluoroscopic dose; 61 mGy COMPLICATIONS: None immediate. PROCEDURE: Informed written consent was  obtained from the patient and/or patient's representative after a discussion of the risks, benefits, and alternatives to treatment. Questions regarding the procedure were encouraged and answered. The operative sites were prepped with chlorhexidine in a sterile fashion, and a sterile drape was applied covering the operative field. Maximum barrier sterile technique with sterile gowns and gloves were used for the procedure. A timeout was performed prior to the initiation of the procedure. Initial access was obtained at the RIGHT neck, however the microwire would not advance. RIGHT jugular venography was then performed. The RIGHT neck access site was aborted. Access was then obtained at the LEFT neck, however the microwire would not advance into the SVC. LEFT jugular venography was also performed. The LEFT neck access site was also aborted. Sonographic evaluation of the LEFT groin demonstrated a widely patent greater saphenous and femoral veins. After creating a small venotomy incision, a micropuncture kit was utilized to access the LEFT greater saphenous vein. Real-time ultrasound guidance was utilized for vascular access including the acquisition of a permanent ultrasound image documenting  patency of the accessed vessel. The microwire was utilized to measure appropriate catheter length. A stiff Glidewire was advanced to the level of the IVC and the micropuncture sheath was exchanged for a peel-away sheath. A palindrome tunneled hemodialysis catheter measuring 33 cm from tip to cuff was tunneled in a retrograde fashion from the lateral LEFT thigh to the venotomy incision. The catheter was then placed through the peel-away sheath with tips ultimately positioned within the infrarenal IVC. Final catheter positioning was confirmed and documented with a spot radiographic image. The catheter aspirates and flushes normally. The catheter was flushed with appropriate volume heparin dwells. The catheter exit site was secured with a 2-0 Ethilon retention suture. The venotomy incision was closed with Dermabond. Dressings were applied. The patient tolerated the procedure well without immediate post procedural complication. IMPRESSION: 1. Occluded superior vena cava, as demonstrated by RIGHT and LEFT jugular venography. 2. Successful placement of a 33 cm tip to cuff tunneled hemodialysis catheter via the LEFT femoral vein. The tip of the catheter is positioned infrarenal IVC. The catheter is ready for immediate use. RECOMMENDATIONS: Challenging vascular access patient. DO NOT REMOVE THE PATIENT'S HEMODIALYSIS CATHETER without consulting vascular and Interventional Radiology (VIR). Roanna Banning, MD Vascular and Interventional Radiology Specialists [facility] Radiology Electronically Signed   By: Roanna Banning M.D.   On: 07/16/2023 13:43   IR Veno/Jugular Left  Result Date: 07/16/2023 INDICATION: ESRD on HD.  Challenging vascular access. EXAM: Title: TUNNELED CENTRAL VENOUS HEMODIALYSIS CATHETER PLACEMENT WITH ULTRASOUND AND FLUOROSCOPIC GUIDANCE Procedures: 1. ULTRASOUND-GUIDED RIGHT JUGULAR, LEFT JUGULAR AND LEFT FEMORAL VENOUS ACCESS 2. RIGHT JUGULAR VENOGRAPHY 3. LEFT JUGULAR VENOGRAPHY 4. LEFT FEMORAL TUNNELED  HEMODIALYSIS CATHETER PLACEMENT MEDICATIONS: Ancef 2 gm IV . The antibiotic was given in an appropriate time interval prior to skin puncture. ANESTHESIA/SEDATION: Moderate (conscious) sedation was employed during this procedure. A total of Versed 1 mg and Fentanyl 50 mcg was administered intravenously. Moderate Sedation Time: 54 minutes. The patient's level of consciousness and vital signs were monitored continuously by radiology nursing throughout the procedure under my direct supervision. FLUOROSCOPY TIME:  Fluoroscopic dose; 61 mGy COMPLICATIONS: None immediate. PROCEDURE: Informed written consent was obtained from the patient and/or patient's representative after a discussion of the risks, benefits, and alternatives to treatment. Questions regarding the procedure were encouraged and answered. The operative sites were prepped with chlorhexidine in a sterile fashion, and a sterile drape was applied covering the operative field. Maximum barrier  sterile technique with sterile gowns and gloves were used for the procedure. A timeout was performed prior to the initiation of the procedure. Initial access was obtained at the RIGHT neck, however the microwire would not advance. RIGHT jugular venography was then performed. The RIGHT neck access site was aborted. Access was then obtained at the LEFT neck, however the microwire would not advance into the SVC. LEFT jugular venography was also performed. The LEFT neck access site was also aborted. Sonographic evaluation of the LEFT groin demonstrated a widely patent greater saphenous and femoral veins. After creating a small venotomy incision, a micropuncture kit was utilized to access the LEFT greater saphenous vein. Real-time ultrasound guidance was utilized for vascular access including the acquisition of a permanent ultrasound image documenting patency of the accessed vessel. The microwire was utilized to measure appropriate catheter length. A stiff Glidewire was advanced  to the level of the IVC and the micropuncture sheath was exchanged for a peel-away sheath. A palindrome tunneled hemodialysis catheter measuring 33 cm from tip to cuff was tunneled in a retrograde fashion from the lateral LEFT thigh to the venotomy incision. The catheter was then placed through the peel-away sheath with tips ultimately positioned within the infrarenal IVC. Final catheter positioning was confirmed and documented with a spot radiographic image. The catheter aspirates and flushes normally. The catheter was flushed with appropriate volume heparin dwells. The catheter exit site was secured with a 2-0 Ethilon retention suture. The venotomy incision was closed with Dermabond. Dressings were applied. The patient tolerated the procedure well without immediate post procedural complication. IMPRESSION: 1. Occluded superior vena cava, as demonstrated by RIGHT and LEFT jugular venography. 2. Successful placement of a 33 cm tip to cuff tunneled hemodialysis catheter via the LEFT femoral vein. The tip of the catheter is positioned infrarenal IVC. The catheter is ready for immediate use. RECOMMENDATIONS: Challenging vascular access patient. DO NOT REMOVE THE PATIENT'S HEMODIALYSIS CATHETER without consulting vascular and Interventional Radiology (VIR). Roanna Banning, MD Vascular and Interventional Radiology Specialists [facility] Radiology Electronically Signed   By: Roanna Banning M.D.   On: 07/16/2023 13:43   IR US Guide Vasc Access Right  Result Date: 07/16/2023 INDICATION: ESRD on HD.  Challenging vascular access. EXAM: Title: TUNNELED CENTRAL VENOUS HEMODIALYSIS CATHETER PLACEMENT WITH ULTRASOUND AND FLUOROSCOPIC GUIDANCE Procedures: 1. ULTRASOUND-GUIDED RIGHT JUGULAR, LEFT JUGULAR AND LEFT FEMORAL VENOUS ACCESS 2. RIGHT JUGULAR VENOGRAPHY 3. LEFT JUGULAR VENOGRAPHY 4. LEFT FEMORAL TUNNELED HEMODIALYSIS CATHETER PLACEMENT MEDICATIONS: Ancef 2 gm IV . The antibiotic was given in an appropriate time interval  prior to skin puncture. ANESTHESIA/SEDATION: Moderate (conscious) sedation was employed during this procedure. A total of Versed 1 mg and Fentanyl 50 mcg was administered intravenously. Moderate Sedation Time: 54 minutes. The patient's level of consciousness and vital signs were monitored continuously by radiology nursing throughout the procedure under my direct supervision. FLUOROSCOPY TIME:  Fluoroscopic dose; 61 mGy COMPLICATIONS: None immediate. PROCEDURE: Informed written consent was obtained from the patient and/or patient's representative after a discussion of the risks, benefits, and alternatives to treatment. Questions regarding the procedure were encouraged and answered. The operative sites were prepped with chlorhexidine in a sterile fashion, and a sterile drape was applied covering the operative field. Maximum barrier sterile technique with sterile gowns and gloves were used for the procedure. A timeout was performed prior to the initiation of the procedure. Initial access was obtained at the RIGHT neck, however the microwire would not advance. RIGHT jugular venography was then performed. The RIGHT neck  access site was aborted. Access was then obtained at the LEFT neck, however the microwire would not advance into the SVC. LEFT jugular venography was also performed. The LEFT neck access site was also aborted. Sonographic evaluation of the LEFT groin demonstrated a widely patent greater saphenous and femoral veins. After creating a small venotomy incision, a micropuncture kit was utilized to access the LEFT greater saphenous vein. Real-time ultrasound guidance was utilized for vascular access including the acquisition of a permanent ultrasound image documenting patency of the accessed vessel. The microwire was utilized to measure appropriate catheter length. A stiff Glidewire was advanced to the level of the IVC and the micropuncture sheath was exchanged for a peel-away sheath. A palindrome tunneled  hemodialysis catheter measuring 33 cm from tip to cuff was tunneled in a retrograde fashion from the lateral LEFT thigh to the venotomy incision. The catheter was then placed through the peel-away sheath with tips ultimately positioned within the infrarenal IVC. Final catheter positioning was confirmed and documented with a spot radiographic image. The catheter aspirates and flushes normally. The catheter was flushed with appropriate volume heparin dwells. The catheter exit site was secured with a 2-0 Ethilon retention suture. The venotomy incision was closed with Dermabond. Dressings were applied. The patient tolerated the procedure well without immediate post procedural complication. IMPRESSION: 1. Occluded superior vena cava, as demonstrated by RIGHT and LEFT jugular venography. 2. Successful placement of a 33 cm tip to cuff tunneled hemodialysis catheter via the LEFT femoral vein. The tip of the catheter is positioned infrarenal IVC. The catheter is ready for immediate use. RECOMMENDATIONS: Challenging vascular access patient. DO NOT REMOVE THE PATIENT'S HEMODIALYSIS CATHETER without consulting vascular and Interventional Radiology (VIR). Roanna Banning, MD Vascular and Interventional Radiology Specialists [facility] Radiology Electronically Signed   By: Roanna Banning M.D.   On: 07/16/2023 13:43   IR US Guide Vasc Access Left  Result Date: 07/16/2023 INDICATION: ESRD on HD.  Challenging vascular access. EXAM: Title: TUNNELED CENTRAL VENOUS HEMODIALYSIS CATHETER PLACEMENT WITH ULTRASOUND AND FLUOROSCOPIC GUIDANCE Procedures: 1. ULTRASOUND-GUIDED RIGHT JUGULAR, LEFT JUGULAR AND LEFT FEMORAL VENOUS ACCESS 2. RIGHT JUGULAR VENOGRAPHY 3. LEFT JUGULAR VENOGRAPHY 4. LEFT FEMORAL TUNNELED HEMODIALYSIS CATHETER PLACEMENT MEDICATIONS: Ancef 2 gm IV . The antibiotic was given in an appropriate time interval prior to skin puncture. ANESTHESIA/SEDATION: Moderate (conscious) sedation was employed during this procedure. A  total of Versed 1 mg and Fentanyl 50 mcg was administered intravenously. Moderate Sedation Time: 54 minutes. The patient's level of consciousness and vital signs were monitored continuously by radiology nursing throughout the procedure under my direct supervision. FLUOROSCOPY TIME:  Fluoroscopic dose; 61 mGy COMPLICATIONS: None immediate. PROCEDURE: Informed written consent was obtained from the patient and/or patient's representative after a discussion of the risks, benefits, and alternatives to treatment. Questions regarding the procedure were encouraged and answered. The operative sites were prepped with chlorhexidine in a sterile fashion, and a sterile drape was applied covering the operative field. Maximum barrier sterile technique with sterile gowns and gloves were used for the procedure. A timeout was performed prior to the initiation of the procedure. Initial access was obtained at the RIGHT neck, however the microwire would not advance. RIGHT jugular venography was then performed. The RIGHT neck access site was aborted. Access was then obtained at the LEFT neck, however the microwire would not advance into the SVC. LEFT jugular venography was also performed. The LEFT neck access site was also aborted. Sonographic evaluation of the LEFT groin demonstrated a widely patent greater  saphenous and femoral veins. After creating a small venotomy incision, a micropuncture kit was utilized to access the LEFT greater saphenous vein. Real-time ultrasound guidance was utilized for vascular access including the acquisition of a permanent ultrasound image documenting patency of the accessed vessel. The microwire was utilized to measure appropriate catheter length. A stiff Glidewire was advanced to the level of the IVC and the micropuncture sheath was exchanged for a peel-away sheath. A palindrome tunneled hemodialysis catheter measuring 33 cm from tip to cuff was tunneled in a retrograde fashion from the lateral LEFT  thigh to the venotomy incision. The catheter was then placed through the peel-away sheath with tips ultimately positioned within the infrarenal IVC. Final catheter positioning was confirmed and documented with a spot radiographic image. The catheter aspirates and flushes normally. The catheter was flushed with appropriate volume heparin dwells. The catheter exit site was secured with a 2-0 Ethilon retention suture. The venotomy incision was closed with Dermabond. Dressings were applied. The patient tolerated the procedure well without immediate post procedural complication. IMPRESSION: 1. Occluded superior vena cava, as demonstrated by RIGHT and LEFT jugular venography. 2. Successful placement of a 33 cm tip to cuff tunneled hemodialysis catheter via the LEFT femoral vein. The tip of the catheter is positioned infrarenal IVC. The catheter is ready for immediate use. RECOMMENDATIONS: Challenging vascular access patient. DO NOT REMOVE THE PATIENT'S HEMODIALYSIS CATHETER without consulting vascular and Interventional Radiology (VIR). Roanna Banning, MD Vascular and Interventional Radiology Specialists [facility] Radiology Electronically Signed   By: Roanna Banning M.D.   On: 07/16/2023 13:43   MR ANGIO HEAD WO CONTRAST  Result Date: 07/15/2023 CLINICAL DATA:  Septic emboli EXAM: MRA HEAD WITHOUT CONTRAST TECHNIQUE: Angiographic images of the Circle of Willis were acquired using MRA technique without intravenous contrast. COMPARISON:  No prior MRA available, correlation is made with MRI head 07/15/2023 FINDINGS: Anterior circulation: Both internal carotid arteries are patent to the termini, without significant stenosis. A1 segments patent. Normal anterior communicating artery. Anterior cerebral arteries are patent to their distal aspects without significant stenosis. No M1 stenosis or occlusion. Poor signal in a long segment of a right M2 branch (series 14, images [id number]). MCA branches are otherwise perfused to their  distal aspects without significant stenosis, although there is overall decreased flow signal in the right frontal lobe in the area of infarction noted on the same-day MRI. Posterior circulation: Vertebral arteries patent to the vertebrobasilar junction without stenosis. Posterior inferior cerebral arteries patent bilaterally. Basilar patent to its distal aspect. Superior cerebellar arteries patent proximally. Patent P1 segments. PCAs perfused to their distal aspects without significant stenosis. The bilateral posterior communicating arteries are patent. Anatomic variants: None significant IMPRESSION: 1. Poor signal in a long segment of a right M2 branch, which is favored to be artifactual, but could represent a true stenosis or occlusion. 2. No other significant intracranial stenosis. Electronically Signed   By: Wiliam Ke M.D.   On: 07/15/2023 21:46   MR BRAIN WO CONTRAST  Result Date: 07/15/2023 CLINICAL DATA:  Neuro deficit with acute stroke suspected. Altered mental status. Cervical radiculopathy. EXAM: MRI HEAD WITHOUT CONTRAST MRI CERVICAL SPINE WITHOUT CONTRAST TECHNIQUE: Multiplanar, multiecho pulse sequences of the brain and surrounding structures, and cervical spine, to include the craniocervical junction and cervicothoracic junction, were obtained without intravenous contrast. COMPARISON:  Head and cervical spine CT 08/08/2021. Head CT 07/09/2023 FINDINGS: MRI HEAD FINDINGS Brain: Sizable infarct in the right frontal lobe and insula extending into the parietal lobe, upper division  MCA territory. Small acute infarcts seen in the left occipital lobe and bilateral cerebellum. ADC map correlation is limited due to the to the degree of motion artifact. Petechial hemorrhage by gradient imaging and T1 hyperintensity at the level of the confluent right cerebral infarct. No hematoma, hydrocephalus, or shift. Vascular: Grossly preserved flow voids Skull and upper cervical spine: Normal marrow signal  Sinuses/Orbits: Opacified left maxillary sinus, new from 2022 and also seen on most recent head CT. MRI CERVICAL SPINE FINDINGS Alignment: Normal Vertebrae: Diffusely hypointense marrow on T1 weighted imaging, likely related to patient's history of end-stage renal disease with anemia. No focal lesion is seen. On STIR imaging no detected marrow edema to imply infection or fracture Cord: Normal shape and volume by sagittal T1 weighted imaging; nondiagnostic assessment on the other sequences. Posterior Fossa, vertebral arteries, paraspinal tissues: Prevertebral edema/fluid without visible source. There is muscular and subcutaneous edema which is generalized and could be from volume overload. Disc levels: No visible herniation or impingement These results will be called to the ordering clinician or representative by the Radiologist Assistant, and communication documented in the PACS or [employer]. IMPRESSION: Brain MRI: 1. 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 Small acute infarcts in the left occipital lobe and bilateral cerebellum. Petechial hemorrhage is present without hematoma. No worrisome mass effect. 2. Significant motion artifact. Cervical MRI: 1. Marked motion artifact with most images being nondiagnostic. 2. Prevertebral edema/fluid but no visible spinal source or drainable collection. Electronically Signed   By: Tiburcio Pea M.D.   On: 07/15/2023 12:35   MR CERVICAL SPINE WO CONTRAST  Result Date: 07/15/2023 CLINICAL DATA:  Neuro deficit with acute stroke suspected. Altered mental status. Cervical radiculopathy. EXAM: MRI HEAD WITHOUT CONTRAST MRI CERVICAL SPINE WITHOUT CONTRAST TECHNIQUE: Multiplanar, multiecho pulse sequences of the brain and surrounding structures, and cervical spine, to include the craniocervical junction and cervicothoracic junction, were obtained without intravenous contrast. COMPARISON:  Head and cervical spine CT 08/08/2021. Head CT 07/09/2023  FINDINGS: MRI HEAD FINDINGS Brain: Sizable infarct in the right frontal lobe and insula extending into the parietal lobe, upper division MCA territory. Small acute infarcts seen in the left occipital lobe and bilateral cerebellum. ADC map correlation is limited due to the to the degree of motion artifact. Petechial hemorrhage by gradient imaging and T1 hyperintensity at the level of the confluent right cerebral infarct. No hematoma, hydrocephalus, or shift. Vascular: Grossly preserved flow voids Skull and upper cervical spine: Normal marrow signal Sinuses/Orbits: Opacified left maxillary sinus, new from 2022 and also seen on most recent head CT. MRI CERVICAL SPINE FINDINGS Alignment: Normal Vertebrae: Diffusely hypointense marrow on T1 weighted imaging, likely related to patient's history of end-stage renal disease with anemia. No focal lesion is seen. On STIR imaging no detected marrow edema to imply infection or fracture Cord: Normal shape and volume by sagittal T1 weighted imaging; nondiagnostic assessment on the other sequences. Posterior Fossa, vertebral arteries, paraspinal tissues: Prevertebral edema/fluid without visible source. There is muscular and subcutaneous edema which is generalized and could be from volume overload. Disc levels: No visible herniation or impingement These results will be called to the ordering clinician or representative by the Radiologist Assistant, and communication documented in the PACS or [employer]. IMPRESSION: Brain MRI: 1. 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 Small acute infarcts in the left occipital lobe and bilateral cerebellum. Petechial hemorrhage is present without hematoma. No worrisome mass effect. 2. Significant motion artifact. Cervical MRI: 1. Marked  motion artifact with most images being nondiagnostic. 2. Prevertebral edema/fluid but no visible spinal source or drainable collection. Electronically Signed   By: Tiburcio Pea M.D.    On: 07/15/2023 12:35   ECHO TEE  Result Date: 07/14/2023    TRANSESOPHOGEAL ECHO REPORT   Patient Name:   QURAN VASCO Date of Exam: 07/14/2023 Medical Rec #:  [id number]     Height:       67.0 in Accession #:    [id number]    Weight:       166.9 lb Date of Birth:  31-Jul-1981    BSA:          1.873 m Patient Age:    41 years      BP:           127/60 mmHg Patient Gender: M             HR:           92 bpm. Exam Location:  Inpatient Procedure: Transesophageal Echo, 3D Echo, Cardiac Doppler and Color Doppler Indications:    Bacteremia  History:        Patient has no prior history of Echocardiogram examinations.                 Aortic Valve Disease, Mitral Valve Disease, Pulmonic Valve                 Disease and Endocarditis; Signs/Symptoms:Bacteremia.  Sonographer:    Lucendia Herrlich RCS Referring Phys: [id number] Jeanella Craze PROCEDURE: After discussion of the risks and benefits of a TEE, an informed consent was obtained from the patient. The transesophogeal probe was passed without difficulty through the esophogus of the patient. Sedation performed by different physician. The patient's vital signs; including heart rate, blood pressure, and oxygen saturation; remained stable throughout the procedure. The patient developed no complications during the procedure.  IMPRESSIONS  1. Left ventricular ejection fraction, by estimation, is 30 to 35%. The left ventricle has moderately decreased function. The left ventricle has no regional wall motion abnormalities.  2. Right ventricular systolic function is normal. The right ventricular size is normal.  3. No left atrial/left atrial appendage thrombus was detected.  4. Large mass (2.00 cm x 0.88 cm ) on the posterior mitral leaflet which appears to be extending in the mitral annulus . Will benefit from cardiac CT morphology. The mitral valve is abnormal. Mild to moderate mitral valve regurgitation. No evidence of mitral stenosis.  5. There are two mobile masses visible on  the anterior leaflet of the tricupid valve which also appears to have a perforation. Another mobile mass ( 0.68 cm x 0.67 cm) on the posterior tricupid leaflet. Moderate Tricuspid regurgitation which appears to be through the leaflet perforation . The tricuspid valve is abnormal.  6. The aortic valve is abnormal. Aortic valve regurgitation is not visualized. No aortic stenosis is present.  7. The inferior vena cava is normal in size with greater than 50% respiratory variability, suggesting right atrial pressure of 3 mmHg. 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 Findings concerning for tricuspid valve vegetation. Cardiac CT morphology will be beneficial. Consult for CT surgery. FINDINGS  Left Ventricle: Left ventricular ejection fraction, by estimation, is 30 to 35%. The left ventricle has moderately decreased function. The left ventricle has no regional wall motion abnormalities. The left ventricular internal cavity size was normal in size. There is no left ventricular hypertrophy. Right Ventricle: The right ventricular size  is normal. No increase in right ventricular wall thickness. Right ventricular systolic function is normal. Left Atrium: Left atrial size was normal in size. No left atrial/left atrial appendage thrombus was detected. Right Atrium: Right atrial size was normal in size. Pericardium: There is no evidence of pericardial effusion. Mitral Valve: Large mass (2.00 cm x 0.88 cm ) on the posterior mitral leaflet which appears to be extending in the mitral annulus . Will benefit from cardiac CT morphology. The mitral valve is abnormal. Mild to moderate mitral valve regurgitation. No evidence of mitral valve stenosis. Tricuspid Valve: There are two mobile masses visible on the anterior leaflet of the tricupid valve which also appears to have a perforation. Another mobile mass ( 0.68 cm x 0.67 cm) on the posterior tricupid leaflet.  Moderate Tricuspid regurgitation which appears to be through the leaflet perforation. The tricuspid valve is abnormal. Aortic Valve: The aortic valve is severely thickened. There is a filamentous mass (0.56 cm) on the. The aortic valve is abnormal. Aortic valve regurgitation is not visualized. No aortic stenosis is present. Pulmonic Valve: The pulmonic valve was normal in structure. Pulmonic valve regurgitation is not visualized. No evidence of pulmonic stenosis. Aorta: The aortic root and ascending aorta are structurally normal, with no evidence of dilitation. Venous: The right upper pulmonary vein, right lower pulmonary vein, left upper pulmonary vein and left lower pulmonary vein are normal. A normal flow pattern is recorded from the left upper pulmonary vein. The inferior vena cava is normal in size with greater than 50% respiratory variability, suggesting right atrial pressure of 3 mmHg. IAS/Shunts: No atrial level shunt detected by color flow Doppler. Additional Comments: Spectral Doppler performed.  AORTA Ao Root diam: 3.00 cm Ao Asc diam:  2.90 cm Kardie Tobb DO Electronically signed by Thomasene Ripple DO Signature Date/Time: 07/14/2023/4:50:32 PM    Final    EP STUDY  Result Date: 07/14/2023 See surgical note for result.  DG Elbow 2 Views Right  Result Date: 07/12/2023 CLINICAL DATA:  Sepsis EXAM: RIGHT ELBOW - 2 VIEW COMPARISON:  None Available. FINDINGS: Slightly limited evaluation secondary to obliquity on the lateral view limiting assessment for an underlying joint effusion. Within this limitation, there is no evidence of a large elbow joint effusion. No fracture or dislocation. Joint spaces are maintained. Extensive vascular calcifications, severely advanced for age. No focal soft tissue swelling. IMPRESSION: 1. No acute osseous abnormality of the right elbow. 2. Extensive vascular calcifications, severely advanced for age. Electronically Signed   By: Duanne Guess D.O.   On: 07/12/2023 19:27    DG Foot 2 Views Right  Result Date: 07/12/2023 CLINICAL DATA:  Sepsis EXAM: RIGHT FOOT - 2 VIEW COMPARISON:  Right foot x-ray 04/10/2023 FINDINGS: There has been prior amputation of the fifth toe and distal aspect of the fifth metatarsal, unchanged. There is no acute fracture or dislocation. No cortical erosions are identified. There is soft tissue swelling of the midfoot. Peripheral vascular calcifications are again noted. IMPRESSION: 1. No acute fracture or dislocation. 2. 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 Electronically Signed   By: Darliss Cheney M.D.   On: 07/12/2023 19:25   ECHOCARDIOGRAM COMPLETE  Result Date: 07/11/2023    ECHOCARDIOGRAM REPORT   Patient Name:   SAHAN PEN Date of Exam: 07/11/2023 Medical Rec #:  [id number]     Height:       67.0 in Accession #:    [id number]    Weight:  169.8 lb Date of Birth:  06-10-1981    BSA:          1.886 m Patient Age:    41 years      BP:           98/72 mmHg Patient Gender: M             HR:           72 bpm. Exam Location:  Inpatient Procedure: 2D Echo, Color Doppler, Cardiac Doppler and Intracardiac            Opacification Agent Indications:    Bacteremia R78.81  History:        Patient has prior history of Echocardiogram examinations, most                 recent 04/26/2019. Risk Factors:Hypertension, Diabetes and ESRD.  Sonographer:    Irving Burton Senior RDCS Referring Phys: Encarnacion Chu COMER  Sonographer Comments: Technically difficult due to poor echo windows IMPRESSIONS  1. Left ventricular ejection fraction, by estimation, is 25 to 30%. Left ventricular ejection fraction by PLAX is 30 %. The left ventricle has severely decreased function. The left ventricle demonstrates global hypokinesis. Left ventricular diastolic parameters are consistent with Grade I diastolic dysfunction (impaired relaxation).  2. Right ventricular systolic function is moderately reduced. The right ventricular size is normal. There  is normal pulmonary artery systolic pressure.  3. Possible filamentous structure on the ventricular side of the mitral valve (image 54). The mitral valve is abnormal. Trivial mitral valve regurgitation.  4. Aortic Valve leaflets and annulus appear thickened, would recommend TEE to assess for endocarditis in the setting of bacteremia. The aortic valve is abnormal. Aortic valve regurgitation is not visualized.  5. The inferior vena cava is normal in size with <50% respiratory variability, suggesting right atrial pressure of 8 mmHg. Conclusion(s)/Recommendation(s): EF significantly declined compared to prior study. FINDINGS  Left Ventricle: Left ventricular ejection fraction, by estimation, is 25 to 30%. Left ventricular ejection fraction by PLAX is 30 %. The left ventricle has severely decreased function. The left ventricle demonstrates global hypokinesis. Definity contrast agent was given IV to delineate the left ventricular endocardial borders. The left ventricular internal cavity size was normal in size. There is no left ventricular hypertrophy. Left ventricular diastolic parameters are consistent with Grade I diastolic dysfunction (impaired relaxation). Right Ventricle: The right ventricular size is normal. No increase in right ventricular wall thickness. Right ventricular systolic function is moderately reduced. There is normal pulmonary artery systolic pressure. The tricuspid regurgitant velocity is 2.18 m/s, and with an assumed right atrial pressure of 8 mmHg, the estimated right ventricular systolic pressure is 27.0 mmHg. Left Atrium: Left atrial size was normal in size. Right Atrium: Right atrial size was normal in size. Pericardium: There is no evidence of pericardial effusion. Mitral Valve: Possible filamentous structure on the ventricular side of the mitral valve (image 54). The mitral valve is abnormal. Trivial mitral valve regurgitation. Tricuspid Valve: The tricuspid valve is normal in structure.  Tricuspid valve regurgitation is mild. Aortic Valve: Aortic Valve leaflets and annulus appear thickened, would recommend TEE to assess for endocarditis in the setting of bacteremia. The aortic valve is abnormal. Aortic valve regurgitation is not visualized. Pulmonic Valve: The pulmonic valve was normal in structure. Pulmonic valve regurgitation is not visualized. Aorta: The aortic root and ascending aorta are structurally normal, with no evidence of dilitation. Venous: The inferior vena cava is normal in size with less than 50%  respiratory variability, suggesting right atrial pressure of 8 mmHg. IAS/Shunts: The atrial septum is grossly normal.  LEFT VENTRICLE PLAX 2D LV EF:         Left            Diastology                ventricular     LV e' medial:    4.79 cm/s                ejection        LV E/e' medial:  16.3                fraction by     LV e' lateral:   6.74 cm/s                PLAX is 30      LV E/e' lateral: 11.6                %. LVIDd:         4.90 cm LVIDs:         4.20 cm LV PW:         0.80 cm LV IVS:        0.70 cm LVOT diam:     1.80 cm LV SV:         42 LV SV Index:   22 LVOT Area:     2.54 cm  RIGHT VENTRICLE RV S prime:     6.42 cm/s TAPSE (M-mode): 1.1 cm LEFT ATRIUM             Index        RIGHT ATRIUM           Index LA diam:        3.30 cm 1.75 cm/m   RA Area:     14.00 cm LA Vol (A2C):   43.7 ml 23.17 ml/m  RA Volume:   35.50 ml  18.82 ml/m LA Vol (A4C):   52.3 ml 27.73 ml/m LA Biplane Vol: 50.8 ml 26.93 ml/m  AORTIC VALVE LVOT Vmax:   92.10 cm/s LVOT Vmean:  69.800 cm/s LVOT VTI:    0.164 m  AORTA Ao Root diam: 2.80 cm MITRAL VALVE               TRICUSPID VALVE MV Area (PHT): 2.44 cm    TR Peak grad:   19.0 mmHg MV Decel Time: 311 msec    TR Vmax:        218.00 cm/s MV E velocity: 78.00 cm/s MV A velocity: 90.30 cm/s  SHUNTS MV E/A ratio:  0.86        Systemic VTI:  0.16 m                            Systemic Diam: 1.80 cm Carolan Clines Electronically signed by Carolan Clines  Signature Date/Time: 07/11/2023/11:47:00 AM    Final    DG CHEST PORT 1 VIEW  Result Date: 07/10/2023 CLINICAL DATA:  Evaluate central venous catheter placement EXAM: PORTABLE CHEST 1 VIEW COMPARISON:  07/09/2023 FINDINGS: There is a left IJ catheter identified. The catheter courses along the left side of the superior mediastinum with tip terminating over the aortic knob superiorly. No pneumothorax identified. Stable cardiac enlargement. No pleural fluid, interstitial edema or airspace disease. IMPRESSION: 1. Left IJ catheter courses along the left side of the superior mediastinum with tip terminating  over the aortic knob superiorly. This does not conform to the normal venous anatomy and I am concerned that this may be within an arterial structure. Repositioning may be indicated. 2. No pneumothorax. 3. Stable cardiac enlargement. These results will be called to the ordering clinician or representative by the Radiologist Assistant, and communication documented in the PACS or [employer]. Electronically Signed   By: Signa Kell M.D.   On: 07/10/2023 17:04   CT ABDOMEN PELVIS WO CONTRAST  Result Date: 07/09/2023 CLINICAL DATA:  Abdomen pain altered EXAM: CT ABDOMEN AND PELVIS WITHOUT CONTRAST TECHNIQUE: Multidetector CT imaging of the abdomen and pelvis was performed following the standard protocol without IV contrast. RADIATION DOSE REDUCTION: This exam was performed according to the departmental dose-optimization program which includes automated exposure control, adjustment of the mA and/or kV according to patient size and/or use of iterative reconstruction technique. COMPARISON:  CT 05/30/2020, ultrasound 04/10/2023 FINDINGS: Lower chest: Lung bases demonstrate cardiomegaly and coronary vascular calcification. Gynecomastia. No acute airspace disease. Hepatobiliary: Heterogenous somewhat geographic area of low density near falciform ligament, measures about 7 by 4 cm. Contracted gallbladder without  calcified stone. No biliary dilatation Pancreas: Unremarkable. No pancreatic ductal dilatation or surrounding inflammatory changes. Spleen: Normal in size without focal abnormality. Adrenals/Urinary Tract: Adrenal glands are normal. Atrophic kidneys. No hydronephrosis. Intrarenal vascular calcifications. Slightly thick-walled urinary bladder. Stomach/Bowel: The stomach is nonenlarged. No dilated small bowel. No acute bowel wall thickening. Vascular/Lymphatic: Advanced vascular calcifications. No aneurysm. No suspicious lymph nodes. Reproductive: Prostate is unremarkable. Other: Negative for pelvic effusion or free air. Musculoskeletal: No acute or suspicious osseous abnormality. Note that the left hip is incompletely included. IMPRESSION: 1. Negative for bowel obstruction or acute bowel wall thickening. 2. Atrophic kidneys. 3. Heterogenous somewhat geographic area of low density within the anterior liver near falciform ligament, measures about 7 x 4 cm. This is indeterminate for geographic steatosis versus liver mass. When the patient is clinically stable and able to follow directions and hold their breath (preferably as an outpatient) further evaluation with dedicated abdominal MRI should be considered. 4. Cardiomegaly. Electronically Signed   By: Jasmine Pang M.D.   On: 07/09/2023 23:53   CT Head Wo Contrast  Result Date: 07/09/2023 CLINICAL DATA:  Mental status change EXAM: CT HEAD WITHOUT CONTRAST TECHNIQUE: Contiguous axial images were obtained from the base of the skull through the vertex without intravenous contrast. RADIATION DOSE REDUCTION: This exam was performed according to the departmental dose-optimization program which includes automated exposure control, adjustment of the mA and/or kV according to patient size and/or use of iterative reconstruction technique. COMPARISON:  CT brain 04/17/2022 FINDINGS: Brain: Significant motion degradation limits the exam. No gross hemorrhage or mass. The  ventricles are grossly stable in size Vascular: Carotid vascular calcification Skull: No gross fracture but motion degradation Sinuses/Orbits: Opacified left maxillary sinus Other: None IMPRESSION: 1. Significant motion degradation limits the exam. No gross acute intracranial abnormality. 2. Opacified left maxillary sinus. Electronically Signed   By: Jasmine Pang M.D.   On: 07/09/2023 23:45   DG Chest Portable 1 View  Result Date: 07/09/2023 CLINICAL DATA:  Shortness of breath EXAM: PORTABLE CHEST 1 VIEW COMPARISON:  04/09/2023, 04/14/2023 FINDINGS: Low lung volumes. Cardiomegaly with central bronchovascular crowding. No pleural effusion or pneumothorax. Left-sided central venous catheter tip projects over the left axillary/subclavian region. Stent in the left upper arm. Tubular calcification in the region of the right jugular vein which may be due to chronic occlusion IMPRESSION: 1. The patient's left-sided  dialysis catheter has been retracted, the tip now projects over the subclavian region. 2. Cardiomegaly with low lung volumes and bronchovascular crowding but no overt edema or pleural effusion Electronically Signed   By: Jasmine Pang M.D.   On: 07/09/2023 23:43    I have personally spent 57 minutes involved in face-to-face and non-face-to-face activities for this patient on the day of the visit. Professional time spent includes the following activities: Preparing to see the patient (review of tests), Obtaining and/or reviewing separately obtained history (admission/discharge record), Performing a medically appropriate examination and/or evaluation , Ordering medications/tests/procedures, referring and communicating with other health care professionals, Documenting clinical information in the EMR, Independently interpreting results (not separately reported), Communicating results to the patient/family/caregiver, Counseling and educating the patient/family/caregiver and Care coordination (not separately  reported).   Plan d/w requesting provider as well as ID pharm D  Of note, portions of this note may have been created with voice recognition software. While this note has been edited for accuracy, occasional wrong-word or 'sound-a-like' substitutions may have occurred due to the inherent limitations of voice recognition software.   Electronically signed by:   Odette Fraction, MD Infectious Disease Physician [facility] Pager: [phone]

## 2023-08-02 NOTE — Inpatient Diabetes Management (Signed)
Inpatient Diabetes Program Recommendations  AACE/ADA: New Consensus Statement on Inpatient Glycemic Control (2015)  Target Ranges:  Prepandial:   less than 140 mg/dL      Peak postprandial:   less than 180 mg/dL (1-2 hours)      Critically ill patients:  140 - 180 mg/dL   Lab Results  Component Value Date   GLUCAP 177 (H) 08/02/2023   HGBA1C 8.9 (H) 07/10/2023    Review of Glycemic Control  Latest Reference Range & Units 08/01/23 18:29 08/01/23 19:34 08/01/23 23:12 08/02/23 03:08 08/02/23 08:30  Glucose-Capillary 70 - 99 mg/dL 161 (H) 096 (H) 045 (H) 180 (H) 177 (H)   Diabetes history: DM 1 Outpatient Diabetes medications:  Novolin R 12 units TID, Novolin 70/30 10 units BID  Current orders for Inpatient glycemic control:  Novolog 0-20 units q 4 hours Nepro 65 ml/hr Semglee 5 units daily  Inpatient Diabetes Program Recommendations:    Consider reducing Novolog correction to sensitive q 4 hours and add Novolog tube feed coverage 2 units q 4 hours as well.  This will allow RN to hold part of Novolog coverage if feeds held.   Thanks,  Beryl Meager, RN, BC-ADM Inpatient Diabetes Coordinator Pager [phone]  (8a-5p)

## 2023-08-02 NOTE — Progress Notes (Addendum)
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  Pt with resp failure on 10/21 resulting in intubation and transfer to [facility]; extubated 10/24.  CRRT started 10/22-stopped 10/30.  PMH positive for ESRD on HD, IDDM, gastroparesis, L AKA, HTN.    PT Comments  Patient with limited tolerance to mobility this session resistive of efforts for EOB, able to reposition and assist to sitting in chair position in bed with improved tolerance.  Patient with edematous L UE and now + for DVT.  Some pain with ROM of L LE.  PT will continue to follow as pt tolerates and participates. Will need inpatient rehab (<3 hours/day) at d/c versus LTACH.    If plan is discharge home, recommend the following: Two people to help with walking and/or transfers;Assist for transportation;Supervision due to cognitive status;Two people to help with bathing/dressing/bathroom;Help with stairs or ramp for entrance   Can travel by private vehicle     No  Equipment Recommendations       Recommendations for Other Services       Precautions / Restrictions Precautions Precautions: Fall Precaution Comments: LAKA, L hemiparesis, R wrist restraing     Mobility  Bed Mobility Overal bed mobility: Needs Assistance Bed Mobility: Rolling Rolling: Total assist, +2 for physical assistance Sidelying to sit: Total assist, +2 for physical assistance       General bed mobility comments: rolled to R with +2 A; attempted side to sit with pt resisting and holding rail and pushing R foot into footboard so returned to supine and scooted up in bed with be placed in chair position    Transfers                         Ambulation/Gait                   Stairs             Wheelchair Mobility     Tilt Bed    Modified Rankin (Stroke Patients Only) Modified Rankin (Stroke Patients Only) Pre-Morbid Rankin Score: Moderate disability Modified Rankin: Severe disability     Balance       Sitting balance - Comments: NT as pt resistant to sitting up to EOB, was able to lift off bed with +2 A for placing pillow at his back                                    Cognition Arousal: Lethargic Behavior During Therapy:  (mildly agitated when roused) Overall Cognitive Status: Impaired/Different from baseline Area of Impairment: Memory, Safety/judgement, Awareness, Problem solving, Attention, Following commands                   Current Attention Level: Focused   Following Commands: Follows one step commands inconsistently, Follows one step commands with increased time Safety/Judgement: Decreased awareness of deficits, Decreased awareness of safety   Problem Solving: Slow processing, Decreased initiation, Difficulty sequencing General Comments: resistive to rolling today        Exercises Other Exercises Other Exercises: PROM L UE shoulder D2 pattern, elbow  flex/ext; pron/sup and wrist & finger flex/ext; noted edema throughout and new DVT in L internal jugular; (pt on Heparin injection) Other Exercises: L LE PROM flex/ext, and abd/add though pt c/o pain with ROM    General Comments General comments (skin integrity, edema, etc.): HR 140, SpO2 in 90's on RA, arterial BP 110's/60's end of session      Pertinent Vitals/Pain Pain Assessment Pain Assessment: Faces Faces Pain Scale: Hurts little more Pain Location: generalized with rolling Pain Descriptors / Indicators: Grimacing, Guarding Pain Intervention(s): Monitored during session, Repositioned, Limited activity within patient's tolerance    Home Living                          Prior Function             PT Goals (current goals can now be found in the care plan section) Progress towards PT goals: Not progressing toward goals - comment    Frequency    Min 1X/week      PT Plan      Co-evaluation PT/OT/SLP Co-Evaluation/Treatment: Yes Reason for Co-Treatment: Necessary to address cognition/behavior during functional activity;For patient/therapist safety;To address functional/ADL transfers PT goals addressed during session: Mobility/safety with mobility        AM-PAC PT "6 Clicks" Mobility   Outcome Measure  Help needed turning from your back to your side while in a flat bed without using bedrails?: Total Help needed moving from lying on your back to sitting on the side of a flat bed without using bedrails?: Total Help needed moving to and from a bed to a chair (including a wheelchair)?: Total Help needed standing up from a chair using your arms (e.g., wheelchair or bedside chair)?: Total Help needed to walk in hospital room?: Total   6 Click Score: 5    End of Session Equipment Utilized During Treatment: Gait belt Activity Tolerance: Other (comment) (resistive for EOB activity) Patient left: in bed;with call bell/phone within reach;with bed alarm set;with restraints reapplied (chair position)   PT Visit Diagnosis: Other abnormalities of gait and mobility (R26.89);Other symptoms and signs involving the nervous system (R29.898);Hemiplegia and hemiparesis Hemiplegia - Right/Left: Left Hemiplegia - dominant/non-dominant: Non-dominant Hemiplegia - caused by: Cerebral infarction     Time: 1220-1248 PT Time Calculation (min) (ACUTE ONLY): 28 min  Charges:    $Therapeutic Activity: 8-22 mins PT General Charges $$ ACUTE PT VISIT: 1 Visit                     Sheran Lawless, PT Acute Rehabilitation Services Office:[phone] 08/02/2023    Elray Mcgregor 08/02/2023, 2:50 PM

## 2023-08-02 NOTE — Progress Notes (Signed)
NAME:  [name], MRN:  [id number], DOB:  06-17-1981, LOS: 23 ADMISSION DATE:  07/09/2023, CONSULTATION DATE:  07/10/2023 REFERRING MD:  Suezanne Jacquet, CHIEF COMPLAINT:  DKA, AMS   History of Present Illness:  Patient is a 42 yo M w/ pertinent PMH ESRD on HD MWF, IDDM, gastroparesis, left AKA, HTN presents to [facility] on 10/11 w/ AMS.   Patient recently admitted to [facility] on 7/12 sepsis from right diabetic foot ulcer. Cultures w/ MRSA. Patient had dialysis catheter removed and replaced. ID treated w/ vanc. Patient refused echo and left on 7/17 against medical advise.    On 10/11 patient was found by family member on ground altered. EMS arrived and dialysis catheter partially removed. Patient more alert for them. Has been complaining of diarrhea x1 week per family. Unsure if patient went to dialysis today. Transferred to [facility] ED. On arrival bp stable 107/70 and afebrile. Patient remains confused and occasionally with agitation. Given dilaudid. CBG 497. Beta H >8. VBG ph 7.06, 16, 54, 4.7. Given iv fluids and started on insulin drip for DKA. CT head negative for acute abnormality. Cxr no significant findings. CT abd/pelvis no acute findings; possible steatosis vs. Liver mass 7 x 4 cm recommending f/u w/ MRI. WBC 20.7 and LA 2.4. Sepsis protocol initiated. Cultures obtained and started on vanc/zosyn. Given ams, dka, and soft bp pccm consulted for icu admission.   10/21 pccm called overnight to assess for worsening hypoxia and AMS after vomiting. On NRB w/ sats 70-80s. Hypoglycemic given dextrose which improved his mental status some. Transferred to 2H icu for intubation.     Pertinent  Medical History  ESRD on HD, HTN, diabetes, diabetic gastroparesis, cellulitis of left foot, left AKA  Significant Hospital Events: Including procedures, antibiotic start and stop dates in addition to other pertinent events   10/12: altered mental status, DKA 10/12: blood cultures positive for MRSA 10/13 TTE LVEF 25-30% w/  severely depressed LVF, global hypokinesis, gd I diastolic dysfxn, mod RV fxn reduction. Possible filamentous structure on MV 10/16 TEE LVEF 30-35%RV fxn nml. Large mass (2.00 cm x 0.88 cm ) on the posterior mitral leaflet which appeared to be extending in the mitral annulus. There are two mobile masses visible on the anterior leaflet of the tricupid valve which also appears to have a perforation. Another mobile  mass ( 0.68 cm x 0.67 cm) on the posterior tricupid leaflet. Moderate Tricuspid regurgitation which appears to  be through the leaflet perforation  10/16 seen by Cardiothoracic surg. Felt poor candidate. Recommended 6 weeks abx and recommended repeat TEE 1 week 10/17 noting left arm weakness. Not clear when started.  MRI showed Right MCA infarct w/ acute infarcts in left occipital and bilateral cerebellum felt c/w septic emboli. Neuro consulted.  10/18 tunneled left femoral vasc cath placed after catheter holiday  10/20 palliative care consulted later that day worsening hypoxia. Sats 70s. After vomiting. More lethargic  10/21: resp failure requiring intubation shortly after midnight  10/22- TEE 10/22 with EF 35%, LV global hypokinesis. Very large vegetation lateral scallop of posterior mitral leaflet 3.9cm length 1 cm width, highly mobile, prolapsing into LV. No large vegetation seen on TV.  Later that afternoon and abrupt hemodynamic changes requiring escalation of vasopressor support.  Felt given the size of his vegetation and increased bulk of the vegetation infections unlikely to clear.  He was felt to be decompensation may have due to of breath leaflet perforation and acute mitral valve regurgb 10/23: per nursing less  responsive than yesterday, increased ETT secretions. Clotting off CRRT. Ct head showing expected evolution of the right MCA stroke.  Cardiothoracic surgery re-consulted for worsening MVE. Felt too high risk here. Recommended referral to tertiary care  10/24 agitated when  sedation decreased, is purposeful but does not follow commands. Tolerating UF gaols of -150. Still on NE infusion but extubated later that afternoon after passing SBT 10/25 failed swallow eval still on just 3 lpm O2. Encephalopathic. Added seroquel in effort to get him off the precedex. Cardiology told no beds at [facility]. Not clear if accepted or not. Worsening pressor requirements. Got blood for hgb 6. Cefepime resumed for clinical decline and leukocytosis  10/26 hemoglobin again dropped to 6.3 overnight with increased pressor requirement yesterday afternoon now on vaso and levo. 10/27 hypoglycemic episode overnight, mental status improved a little over weekend  10/28 getting another unit of blood more awake.  Oriented x 2.  Still on Precedex.  Having liquid stools. 10/30 off Precedex, low-dose levo.  Plan to transition to IHD 10/29 Cefepime for aspiration course completed.  10/31 seen by overnight team for hypoxia and somnolence ABG with mild hypercapnia. Again declined by [facility] because they were at capacity. More awake. Doing better w/ Bed side swallow but still likely aspirating. CRRT stopped.  11/1 adjustments made for better glycemic control.  Much more awake but a little confused 11/4 remains on low dose levo  Interim History / Subjective:  Overnight course complicated by ongoing agitation.  Given ativan.  Remains on low dose norepi  Objective   Blood pressure 124/69, pulse (!) 136, temperature 98 F (36.7 C), temperature source Axillary, resp. rate 14, height 5\' 7"  (1.702 m), weight 73.8 kg, SpO2 100%.        Intake/Output Summary (Last 24 hours) at 08/02/2023 0945 Last data filed at 08/02/2023 0600 Gross per 24 hour  Intake 1600.87 ml  Output 100 ml  Net 1500.87 ml   Filed Weights   07/31/23 0500 08/01/23 0500 08/02/23 0500  Weight: 72.1 kg 67.3 kg 73.8 kg    Examination: General: Chronically ill appearing middle aged male  HEENT: [place]/AT, PERRL, no JVD Neuro: Somnolent, but  does arouse and follows commands on the right. Flaccid on the left.  Chest: Clear bilateral breath sounds Heart: Tachy, regular, no MRG Abdomen: Soft, NT, ND Skin: Multiple scar marks noted on the chest Extremities: Status post left AKA, R great toe amputation   Resolved Hospital Problem list   DKA  Thrombocytopenia Aspiration PNA - Cefepime course completed 10/29 Acute hypoxic respiratory failure - extubated on 10/24  Assessment & Plan:  Evolving cavitary PNA and mucous plugging:  suspect cavitation is embolic from tricuspid valve endocarditis Currently on room air Continue IV Vancomycin per ID recommendations - may need to re-involve them regarding duration Mobilize as above Consider repeat CT scan at 4-6 weeks   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  Ultimately will need valve surgery Cardiology and cardiothoracic surgery recommend tertiary care transfer  [facility] was called twice, they did not accept patient due to capacity Will attempt other centers today  Ongoing sinus tachycardia Heart  rate is still remain in 130s to 140s Will assess for left arm DVT due to swelling.  Holding metop due to pressors being restarted.  Continue telemetry monitoring  Acute metabolic encephalopathy w/ agitated delirium  Acute Right MCA  stroke/septic infarct w/ left sided hemiparesis   Mental status has significantly improved, he is awake following commands Continue delirium precautions Change Klonopin to as needed Continue scheduled Seroquel  End-stage renal disease on  hemodialysis (MWF at baseline) Has occluded bilateral [place] veins  Mild hyperkalemia off CRRT CRRT stopped 10/31 Next IHD today Nephrology is following  Dysphagia Continue tube feeds Failed speech and swallow evaluation multiple times  T2DM w/ hyperglycemia and hypoglycemia Patient with brittle diabetes Blood sugars are better controlled Continues Semglee daily Continue sliding scale insulin with CBG goal 140-180  Anemia and thrombocytopenia critical illness S/p 4 units total PRBC Monitor H&H and platelet count Watch for signs of bleeding  Incidental finding of Liver steatosis vs. Liver mass CT abd/pelvis showing 7 x 4 cm density in anterior liver  Outpatient follow-up with GI   Best Practice (right click and "Reselect all SmartList Selections" daily)   Diet/type: tubefeeds DVT prophylaxis: Subcutaneous heparin GI prophylaxis: N/A Lines: Central line, Dialysis Catheter, Arterial Line, and yes and it is still needed Foley:  Yes, and it is still needed Code Status:  full code Last date of multidisciplinary goals of care discussion: Continue to update patient and family daily  Critical care time 50 min    Joneen Roach, AGACNP-BC [place]  See Amion for personal pager PCCM on call pager [phone] until 7pm. Please call Elink 7p-7a. [phone]  08/02/2023 10:07 AM

## 2023-08-02 NOTE — Progress Notes (Signed)
PCCM INTERVAL PROGRESS NOTE   LUE doppler positive for DVT in the Left internal jugular, subclavian, and axillary veins. Possibly septic, but cannot rule out VTE.   Starting heparin infusion   Joneen Roach, AGACNP-BC [place] [facility]  See Amion for personal pager PCCM on call pager [phone] until 7pm. Please call Elink 7p-7a. [phone]  08/02/2023 2:02 PM

## 2023-08-02 NOTE — Progress Notes (Signed)
[facility] NEPHROLOGY PROGRESS NOTE  Subjective:  Seen and examined in ICU.  Net I/O yesterday: +2 L yesterday Pressors: off since 10 am today  Discussed w/ ICU RN  Objective Vital signs in last 24 hours: Vitals:   07/26/23 1000 07/26/23 1015 07/26/23 1030 07/26/23 1045  BP:      Pulse: 99 100 97 100  Resp: (!) 24 (!) 25 (!) 21 (!) 24  Temp:      TempSrc:      SpO2: 100% 98% 99% 99%  Weight:      Height:       Physical Exam: General: ill appearing, able to speak, some L facial droop Heart:RRR, +systolic murmur Lungs: b/l chest expansion, normal wob Abdomen:soft, Non-tender, non-distended Extremities: no pitting edema, left aka Neuro: awake, following some commands Dialysis Access: left femoral TDC  OP HD: MWF [facility] 4h  400/1.5    77kg   2/3 bath  [facility] TDC    Heparin none - last OP HD 10/7, post wt 74.6kg - coming off 75- 76kg - misses HD once every other week approx - rocaltrol 1.50 mcg  - no esa  CXR - 10/23 - mostly clear, poss vasc congestion  Assessment/ Plan: MRSA bacteremia with MV endocarditis -s/p line holiday, new TDC placed by IR 10/18 -on vanc and cefepime -per primary service and CTS-high risk for surgery  Acute ischemic CVA/septic infarct 10/17 -neuro signed off, per primary service  AHRF -possibly secondary to aspiration. Intubated 10/21, extubated 10/24 -per PCCM  Volume:  - remains 10kg under dry wt, no sig edema - last CXR 10/23, no sig edema  Hyperkalemia - change TF"s to Nepro - low K+ bath w/ HD monday  Hypotension: - minimal levo gtt this am, trying to get off  ESRD -typically on HD MWF - started CRRT 10/22 --> stopped 10/30 - next HD monday  Dialysis access - Per IR note, occluded both right and left subclavian on venography therefore successful placement of left femoral TDC with the tip of the catheter in the infrarenal IVC.  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 Transfuse PRN for hgb <7. On ESA,  cont darbe 40 mcg IV weekly.   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 Cont calcitriol per tube. Holding sensipar as not swallowing well yet. No binders for now given low phos.     Shane Moselle  MD  [facility] 08/02/2023, 3:15 PM  Recent Labs  Lab 07/27/23 1627 07/27/23 1628 07/28/23 0356 07/28/23 0823 08/01/23 0331 08/02/23 0410  HGB  --    < >  --    < > 7.6* 7.0*  ALBUMIN 1.9*  --  1.9*  --   --   --   CALCIUM 9.7  --  9.7   < > 8.8* 9.2  PHOS 2.0*  --  2.5   < > 5.5* 6.0*  CREATININE 1.66*   < > 1.64*   < > 4.54* 6.02*  K 3.8   < > 4.3   < > 5.5* 5.5*   < > = values in this interval not displayed.    Inpatient medications:  calcitRIOL  1.5 mcg Per Tube Q M,W,F   Chlorhexidine Gluconate Cloth  6 each Topical Q0600   darbepoetin (ARANESP) injection - DIALYSIS  40 mcg Subcutaneous Q Fri-1800   feeding supplement (PROSource TF20)  60 mL Per Tube BID   fiber supplement (BANATROL TF)  60 mL Per Tube BID  gabapentin  300 mg Per Tube QHS   heparin  4,000 Units Intravenous Once   heparin sodium (porcine)       insulin aspart  0-9 Units Subcutaneous Q4H   insulin glargine-yfgn  5 Units Subcutaneous Daily   lidocaine  1 Application Topical Once   lipase/protease/amylase)  20,880 Units Per Tube Once   And   sodium bicarbonate  650 mg Per Tube Once   midodrine  20 mg Per Tube Q8H   multivitamin  1 tablet Per Tube BID   nutrition supplement (JUVEN)  1 packet Per Tube BID BM   mouth rinse  15 mL Mouth Rinse 4 times per day   QUEtiapine  50 mg Per Tube QHS   [START ON 08/03/2023] QUEtiapine  50 mg Per Tube Q0600    anticoagulant sodium citrate     feeding supplement (NEPRO CARB STEADY) 65 mL/hr at 08/02/23 1300   heparin     norepinephrine (LEVOPHED) Adult infusion Stopped (08/02/23 1051)   vancomycin     acetaminophen, acetaminophen, albuterol, alteplase, anticoagulant sodium citrate, clonazePAM, feeding supplement (NEPRO CARB STEADY), Gerhardt's butt cream, heparin, heparin,  heparin sodium (porcine), lidocaine (PF), lidocaine-prilocaine, LORazepam, mouth rinse, oxyCODONE, pentafluoroprop-tetrafluoroeth

## 2023-08-02 NOTE — Progress Notes (Signed)
Left upper extremity venous duplex has been completed. Preliminary results can be found in CV Proc through chart review.  Results were given to the patient's nurse, Evelene Croon.  08/02/23 11:51 AM Olen Cordial RVT

## 2023-08-02 NOTE — Procedures (Signed)
HD Note:  Some information was entered later than the data was gathered due to patient care needs. The stated time with the data is accurate.  Patient received HD at bedside.  Patient was not interactive during initial set up  Informed consent signed and in chart.   Access used: left femoral HD catheter Access issues: Lines reversed to maintain appropriate arterial pressures  Patient tolerated treatment well physically. Patient became alert with care soon after treatment started.  He remained restless during treatment. Confused and random movement.  TX duration: 3.5 hours  Total UF removed: 1000 ml  Hand-off given to patient's nurse.   Sakura Denis L. Dareen Piano, RN Kidney Dialysis Unit.

## 2023-08-02 NOTE — Progress Notes (Signed)
Occupational Therapy Treatment Patient Details Name: [name] MRN: [id number] DOB: [birth year] Today's Date: 08/02/2023   History of present illness Patient is a 42 y/o male admitted 07/09/23 with confusion found to be in septic shock found to have MRSA in blood cultures, in DKA, TEE positive for vegitation on multiple valves, MRI showing R MCA infarct.  Pt with resp failure on 10/21 resulting in intubation and transfer to [facility]; extubated 10/24.  CRRT started 10/22-stopped 10/30.  PMH positive for ESRD on HD, IDDM, gastroparesis, L AKA, HTN.   OT comments  Pt re-ev as not seen since in ICU. Pt with limited activity tolerance this session and cleared to be seen by nurse. Pt resistive of efforts for sitting EOB requiring +2 total A. Pt repositioned and moved into chair position with good tiolerance. Will continue to follow as pt tolerates. Pt to benefit from inpatient rehab <3 hours/day vs LTACH.       If plan is discharge home, recommend the following:  Two people to help with walking and/or transfers;A lot of help with bathing/dressing/bathroom;Assistance with cooking/housework;Direct supervision/assist for medications management;Assist for transportation;Help with stairs or ramp for entrance   Equipment Recommendations  Other (comment) (TBD)    Recommendations for Other Services      Precautions / Restrictions Precautions Precautions: Fall Precaution Comments: LAKA, L hemiparesis, R wrist restraing       Mobility Bed Mobility Overal bed mobility: Needs Assistance Bed Mobility: Rolling Rolling: Total assist, +2 for physical assistance Sidelying to sit: Total assist, +2 for physical assistance       General bed mobility comments: rolled to R with +2 A; attempted side to sit with pt resisting and holding rail and pushing R foot into footboard so returned to supine and scooted up in bed with be placed in chair position    Transfers                   General transfer  comment: deferred     Balance       Sitting balance - Comments: NT as pt resistant to sitting up to EOB, was able to lift off bed with +2 A for placing pillow at his back                                   ADL either performed or assessed with clinical judgement   ADL Overall ADL's : Needs assistance/impaired     Grooming: Total assistance Grooming Details (indicate cue type and reason): resistive to washing face and applying lip moisturizer with head turn despite thick crusting on lip                                    Extremity/Trunk Assessment Upper Extremity Assessment Upper Extremity Assessment: LUE deficits/detail LUE Deficits / Details: No active movement; pt states no when asked if he has sensation LUE Sensation: decreased light touch LUE Coordination: decreased fine motor   Lower Extremity Assessment Lower Extremity Assessment: Defer to PT evaluation        Vision   Additional Comments: able to visually locate therapists on both L and R   Perception     Praxis      Cognition Arousal: Lethargic Behavior During Therapy:  (mildly agitated when roused) Overall Cognitive Status: Impaired/Different from baseline Area of Impairment: Memory, Safety/judgement, Awareness,  Problem solving, Attention, Following commands                 Orientation Level:  (NT this session) Current Attention Level: Focused Memory: Decreased short-term memory Following Commands: Follows one step commands inconsistently, Follows one step commands with increased time Safety/Judgement: Decreased awareness of deficits, Decreased awareness of safety Awareness: Intellectual Problem Solving: Slow processing, Decreased initiation, Difficulty sequencing General Comments: resistive to rolling and getting EOB today secondary to "not feeling well" and cognition/decr reasining in regard to need to mobilize        Exercises      Shoulder Instructions        General Comments HR 140, SpO2 in 90's on RA, arterial BP 110's/60's end of session. Min secretions in session, can follow simple commands to spit into suction tube. Pt with minimal verbalizations, ~50+% nonsensical    Pertinent Vitals/ Pain       Pain Assessment Pain Assessment: Faces Faces Pain Scale: Hurts little more Pain Location: generalized with rolling Pain Descriptors / Indicators: Grimacing, Guarding Pain Intervention(s): Limited activity within patient's tolerance, Monitored during session  Home Living                                          Prior Functioning/Environment              Frequency  Min 2X/week        Progress Toward Goals  OT Goals(current goals can now be found in the care plan section)  Progress towards OT goals: Progressing toward goals  Acute Rehab OT Goals Patient Stated Goal: none stated OT Goal Formulation: With patient/family Time For Goal Achievement: 08/16/23 Potential to Achieve Goals: Good  Plan      Co-evaluation    PT/OT/SLP Co-Evaluation/Treatment: Yes Reason for Co-Treatment: Necessary to address cognition/behavior during functional activity;For patient/therapist safety;To address functional/ADL transfers PT goals addressed during session: Mobility/safety with mobility OT goals addressed during session: ADL's and self-care      AM-PAC OT "6 Clicks" Daily Activity     Outcome Measure   Help from another person eating meals?: Total Help from another person taking care of personal grooming?: A Lot Help from another person toileting, which includes using toliet, bedpan, or urinal?: Total Help from another person bathing (including washing, rinsing, drying)?: Total Help from another person to put on and taking off regular upper body clothing?: Total Help from another person to put on and taking off regular lower body clothing?: Total 6 Click Score: 7    End of Session    OT Visit Diagnosis:  Unsteadiness on feet (R26.81);Other abnormalities of gait and mobility (R26.89);Other symptoms and signs involving the nervous system (R29.898);Hemiplegia and hemiparesis;Muscle weakness (generalized) (M62.81) Hemiplegia - Right/Left: Left Hemiplegia - dominant/non-dominant: Non-Dominant Hemiplegia - caused by: Cerebral infarction   Activity Tolerance Patient limited by pain;Treatment limited secondary to agitation   Patient Left in bed;with call bell/phone within reach;with bed alarm set   Nurse Communication Mobility status;Precautions        Time: 9562-1308 OT Time Calculation (min): 25 min  Charges: OT General Charges $OT Visit: 1 Visit OT Evaluation $OT Re-eval: 1 Re-eval  Tyler Deis, OTR/L [facility] Office: [phone]   [name] 08/02/2023, 3:47 PM

## 2023-08-03 ENCOUNTER — Inpatient Hospital Stay (HOSPITAL_COMMUNITY): Payer: Medicare HMO

## 2023-08-03 ENCOUNTER — Encounter (HOSPITAL_COMMUNITY): Payer: Self-pay | Admitting: Pulmonary Disease

## 2023-08-03 DIAGNOSIS — I059 Rheumatic mitral valve disease, unspecified: Secondary | ICD-10-CM | POA: Diagnosis not present

## 2023-08-03 LAB — CBC
HCT: 22.1 % — ABNORMAL LOW (ref 39.0–52.0)
Hemoglobin: 6.8 g/dL — CL (ref 13.0–17.0)
MCH: 30.1 pg (ref 26.0–34.0)
MCHC: 30.8 g/dL (ref 30.0–36.0)
MCV: 97.8 fL (ref 80.0–100.0)
Platelets: 324 10*3/uL (ref 150–400)
RBC: 2.26 MIL/uL — ABNORMAL LOW (ref 4.22–5.81)
RDW: 17.8 % — ABNORMAL HIGH (ref 11.5–15.5)
WBC: 17.5 10*3/uL — ABNORMAL HIGH (ref 4.0–10.5)
nRBC: 0.1 % (ref 0.0–0.2)

## 2023-08-03 LAB — HEPARIN LEVEL (UNFRACTIONATED)
Heparin Unfractionated: 0.19 [IU]/mL — ABNORMAL LOW (ref 0.30–0.70)
Heparin Unfractionated: 0.11 [IU]/mL — ABNORMAL LOW (ref 0.30–0.70)
Heparin Unfractionated: 0.16 [IU]/mL — ABNORMAL LOW (ref 0.30–0.70)

## 2023-08-03 LAB — BASIC METABOLIC PANEL
Anion gap: 12 (ref 5–15)
BUN: 62 mg/dL — ABNORMAL HIGH (ref 6–20)
CO2: 28 mmol/L (ref 22–32)
Calcium: 9 mg/dL (ref 8.9–10.3)
Chloride: 93 mmol/L — ABNORMAL LOW (ref 98–111)
Creatinine, Ser: 3.82 mg/dL — ABNORMAL HIGH (ref 0.61–1.24)
GFR, Estimated: 19 mL/min — ABNORMAL LOW (ref >60)
Glucose, Bld: 201 mg/dL — ABNORMAL HIGH (ref 70–99)
Potassium: 4.3 mmol/L (ref 3.5–5.1)
Sodium: 133 mmol/L — ABNORMAL LOW (ref 135–145)

## 2023-08-03 LAB — GLUCOSE, CAPILLARY
Glucose-Capillary: 177 mg/dL — ABNORMAL HIGH (ref 70–99)
Glucose-Capillary: 257 mg/dL — ABNORMAL HIGH (ref 70–99)
Glucose-Capillary: 258 mg/dL — ABNORMAL HIGH (ref 70–99)
Glucose-Capillary: 208 mg/dL — ABNORMAL HIGH (ref 70–99)
Glucose-Capillary: 160 mg/dL — ABNORMAL HIGH (ref 70–99)

## 2023-08-03 LAB — PREPARE RBC (CROSSMATCH)

## 2023-08-03 LAB — MAGNESIUM: Magnesium: 2 mg/dL (ref 1.7–2.4)

## 2023-08-03 LAB — PHOSPHORUS: Phosphorus: 3.5 mg/dL (ref 2.5–4.6)

## 2023-08-03 MED ORDER — HYDROMORPHONE HCL 1 MG/ML IJ SOLN
1.0000 mg | INTRAMUSCULAR | Status: DC | PRN
  Administered 2023-08-03 – 2023-08-13 (×28): 1 mg via INTRAVENOUS
  Filled 2023-08-03 (×29): qty 1

## 2023-08-03 MED ORDER — NEPRO/CARBSTEADY PO LIQD
1000.0000 mL | ORAL | Status: DC
  Administered 2023-08-04 – 2023-08-07 (×5): 1000 mL
  Filled 2023-08-03 (×7): qty 1000

## 2023-08-03 MED ORDER — HALOPERIDOL LACTATE 5 MG/ML IJ SOLN: 5.0000 mg | Freq: Four times a day (QID) | INTRAMUSCULAR | Status: DC | PRN

## 2023-08-03 MED ORDER — ACETAMINOPHEN 10 MG/ML IV SOLN
1000.0000 mg | Freq: Once | INTRAVENOUS | Status: AC
  Administered 2023-08-03: 1000 mg via INTRAVENOUS
  Filled 2023-08-03: qty 100

## 2023-08-03 MED ORDER — SODIUM BICARBONATE 650 MG PO TABS
650.0000 mg | ORAL_TABLET | Freq: Once | ORAL | Status: AC
  Administered 2023-08-03: 650 mg
  Filled 2023-08-03: qty 1

## 2023-08-03 MED ORDER — OXYCODONE HCL 5 MG/5ML PO SOLN: 7.5000 mg | ORAL | Status: DC | PRN

## 2023-08-03 MED ORDER — HEPARIN (PORCINE) 25000 UT/250ML-% IV SOLN
2600.0000 [IU]/h | INTRAVENOUS | Status: DC
  Administered 2023-08-04: 2000 [IU]/h via INTRAVENOUS
  Administered 2023-08-04: 1850 [IU]/h via INTRAVENOUS
  Administered 2023-08-05: 2350 [IU]/h via INTRAVENOUS
  Administered 2023-08-05: 2150 [IU]/h via INTRAVENOUS
  Administered 2023-08-06: 2600 [IU]/h via INTRAVENOUS
  Administered 2023-08-06: 2400 [IU]/h via INTRAVENOUS
  Administered 2023-08-07 – 2023-08-09 (×5): 2600 [IU]/h via INTRAVENOUS
  Filled 2023-08-03 (×12): qty 250

## 2023-08-03 MED ORDER — HYDROMORPHONE HCL 1 MG/ML IJ SOLN: 1.0000 mg | INTRAMUSCULAR | Status: DC | PRN

## 2023-08-03 MED ORDER — SODIUM CHLORIDE 0.9% IV SOLUTION: Freq: Once | INTRAVENOUS | Status: AC

## 2023-08-03 MED ORDER — OXYCODONE HCL 5 MG PO TABS
7.5000 mg | ORAL_TABLET | ORAL | Status: DC | PRN
  Administered 2023-08-07 – 2023-08-11 (×3): 7.5 mg
  Filled 2023-08-03 (×3): qty 2

## 2023-08-03 MED ORDER — PROSOURCE TF20 ENFIT COMPATIBL EN LIQD
60.0000 mL | Freq: Every day | ENTERAL | Status: DC
Start: 2023-08-04 — End: 2023-08-28
  Administered 2023-08-05 – 2023-08-26 (×21): 60 mL
  Filled 2023-08-03 (×22): qty 60

## 2023-08-03 MED ORDER — PANCRELIPASE (LIP-PROT-AMYL) 10440-39150 UNITS PO TABS
20880.0000 [IU] | ORAL_TABLET | Freq: Once | ORAL | Status: AC
  Administered 2023-08-03: 20880 [IU]
  Filled 2023-08-03: qty 2

## 2023-08-03 MED ORDER — NEPRO/CARBSTEADY PO LIQD: 237.0000 mL | ORAL | Status: DC | PRN

## 2023-08-03 NOTE — Progress Notes (Signed)
Attempted tube unclogging protocol for plugged Cortrak NG tube. This was unsuccessful as the clogging of the tube is so solid, unable to successfully instill solution into tube.  Cortrak removed per order

## 2023-08-03 NOTE — Progress Notes (Signed)
NAME:  [name], MRN:  [id number], DOB:  August 24, 1981, LOS: 24 ADMISSION DATE:  07/09/2023, CONSULTATION DATE:  07/10/2023 REFERRING MD:  Suezanne Jacquet, CHIEF COMPLAINT:  DKA, AMS   History of Present Illness:  Patient is a 42 yo M w/ pertinent PMH ESRD on HD MWF, IDDM, gastroparesis, left AKA, HTN presents to [facility] on 10/11 w/ AMS.   Patient recently admitted to [facility] on 7/12 sepsis from right diabetic foot ulcer. Cultures w/ MRSA. Patient had dialysis catheter removed and replaced. ID treated w/ vanc. Patient refused echo and left on 7/17 against medical advise.    On 10/11 patient was found by family member on ground altered. EMS arrived and dialysis catheter partially removed. Patient more alert for them. Has been complaining of diarrhea x1 week per family. Unsure if patient went to dialysis today. Transferred to [facility] ED. On arrival bp stable 107/70 and afebrile. Patient remains confused and occasionally with agitation. Given dilaudid. CBG 497. Beta H >8. VBG ph 7.06, 16, 54, 4.7. Given iv fluids and started on insulin drip for DKA. CT head negative for acute abnormality. Cxr no significant findings. CT abd/pelvis no acute findings; possible steatosis vs. Liver mass 7 x 4 cm recommending f/u w/ MRI. WBC 20.7 and LA 2.4. Sepsis protocol initiated. Cultures obtained and started on vanc/zosyn. Given ams, dka, and soft bp pccm consulted for icu admission.   10/21 pccm called overnight to assess for worsening hypoxia and AMS after vomiting. On NRB w/ sats 70-80s. Hypoglycemic given dextrose which improved his mental status some. Transferred to 2H icu for intubation.    Pertinent Medical History:  ESRD on HD, HTN, diabetes, diabetic gastroparesis, cellulitis of left foot, left AKA  Significant Hospital Events: Including procedures, antibiotic start and stop dates in addition to other pertinent events   10/12: altered mental status, DKA 10/12: blood cultures positive for MRSA 10/13 TTE LVEF 25-30% w/  severely depressed LVF, global hypokinesis, gd I diastolic dysfxn, mod RV fxn reduction. Possible filamentous structure on MV 10/16 TEE LVEF 30-35%RV fxn nml. Large mass (2.00 cm x 0.88 cm ) on the posterior mitral leaflet which appeared to be extending in the mitral annulus. There are two mobile masses visible on the anterior leaflet of the tricupid valve which also appears to have a perforation. Another mobile  mass ( 0.68 cm x 0.67 cm) on the posterior tricupid leaflet. Moderate Tricuspid regurgitation which appears to  be through the leaflet perforation  10/16 seen by Cardiothoracic surg. Felt poor candidate. Recommended 6 weeks abx and recommended repeat TEE 1 week 10/17 noting left arm weakness. Not clear when started.  MRI showed Right MCA infarct w/ acute infarcts in left occipital and bilateral cerebellum felt c/w septic emboli. Neuro consulted.  10/18 tunneled left femoral vasc cath placed after catheter holiday  10/20 palliative care consulted later that day worsening hypoxia. Sats 70s. After vomiting. More lethargic  10/21: resp failure requiring intubation shortly after midnight  10/22- TEE 10/22 with EF 35%, LV global hypokinesis. Very large vegetation lateral scallop of posterior mitral leaflet 3.9cm length 1 cm width, highly mobile, prolapsing into LV. No large vegetation seen on TV.  Later that afternoon and abrupt hemodynamic changes requiring escalation of vasopressor support.  Felt given the size of his vegetation and increased bulk of the vegetation infections unlikely to clear.  He was felt to be decompensation may have due to of breath leaflet perforation and acute mitral valve regurgb 10/23: per nursing less responsive than  yesterday, increased ETT secretions. Clotting off CRRT. Ct head showing expected evolution of the right MCA stroke.  Cardiothoracic surgery re-consulted for worsening MVE. Felt too high risk here. Recommended referral to tertiary care  10/24 agitated when  sedation decreased, is purposeful but does not follow commands. Tolerating UF gaols of -150. Still on NE infusion but extubated later that afternoon after passing SBT 10/25 failed swallow eval still on just 3 lpm O2. Encephalopathic. Added seroquel in effort to get him off the precedex. Cardiology told no beds at [facility]. Not clear if accepted or not. Worsening pressor requirements. Got blood for hgb 6. Cefepime resumed for clinical decline and leukocytosis  10/26 Hemoglobin again dropped to 6.3 overnight with increased pressor requirement yesterday afternoon now on vaso and levo. 10/27 Hypoglycemic episode overnight, mental status improved a little over weekend  10/28 Getting another unit of blood more awake.  Oriented x 2.  Still on Precedex.  Having liquid stools. 10/30 Off Precedex, low-dose levo.  Plan to transition to IHD 10/29 Cefepime for aspiration course completed.  10/31 Seen by overnight team for hypoxia and somnolence ABG with mild hypercapnia. Again declined by [facility] because they were at capacity. More awake. Doing better w/ Bed side swallow but still likely aspirating. CRRT stopped.  11/1 Adjustments made for better glycemic control.  Much more awake but a little confused 11/4 Remains on low dose levo  Interim History / Subjective:  No significant events overnight Asking for juice this AM, not cooperative with exam Remains delirious/confused Off of vasopressors Hgb 6.8 this morning, 1U PRBCs ordered Tolerating iHD No movement with tertiary center acceptance  Objective:  Blood pressure (!) 105/58, pulse (!) 138, temperature (!) 100.6 F (38.1 C), temperature source Axillary, resp. rate 20, height 5\' 7"  (1.702 m), weight 74.9 kg, SpO2 95%.        Intake/Output Summary (Last 24 hours) at 08/03/2023 0733 Last data filed at 08/03/2023 0700 Gross per 24 hour  Intake 1527.81 ml  Output 1635 ml  Net -107.19 ml   Filed Weights   08/01/23 0500 08/02/23 0500 08/03/23 0500  Weight:  67.3 kg 73.8 kg 74.9 kg   Physical Examination: General: Chronically ill-appearing middle-aged man in NAD. Intermittently agitated, appears uncomfortable. HEENT: [place]/AT, anicteric sclera, PERRL, moist mucous membranes. Neuro: Awake, oriented x 1. Responds to verbal stimuli. Following commands intermittently. Unilateral L-sided neglect noted. CV: Tachycardic, regular rhythm, +murmur. PULM: Breathing even and unlabored on RA. Lung fields diminished at bases. GI: Soft, mild generalized TTP, nondistended. Normoactive bowel sounds. Liquid stool present in Flexi-seal. Extremities: Trace LE edema. L AKA noted. Skin: Warm/dry, multiple scratches/scars over chest/R leg.  Resolved Hospital Problem List:  DKA  Thrombocytopenia Aspiration PNA - Cefepime course completed 10/29 Acute hypoxic respiratory failure - extubated on 10/24  Assessment & Plan:  Evolving cavitary PNA and mucous plugging Suspect cavitation is embolic from tricuspid valve endocarditis. 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 - Low dose metoprolol held in the setting of hypotension, consider slow resumption - Definitive management is valve repair/replacement - Cardiology/TCTS consulted, recommend transfer to tertiary care center due to high-risk patient/procedure - [facility] has denied transfer x 2  (?capacity), [facility] denied for multiple prohibitive surgical risks  Ongoing sinus tachycardia, ?r/t LUE DVT Heart rate remains 130s to 140s. - Cardiac monitoring - Optimize electrolytes for K > 4, Mg > 2 - Consider resumption of low-dose metoprolol, given ongoing tachycardic and improved BP  LUE DVT Dopplers 11/4 positive for L IJ, L [place], L axillary veins; septic versus VTE. 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 - Delirium precautions - Klonopin PRN - Continue Seroquel - PT/OT/SLP as able to participate in care  End-stage renal disease on hemodialysis (MWF at baseline) Has occluded bilateral [place] veins  Mild hyperkalemia off CRRT - Nephrology following, appreciate assistance - Successfully transitioned to [facility], next 11/6 - Trend BMP - Replete electrolytes as indicated - Monitor I&Os - Avoid nephrotoxic agents as able - Ensure adequate renal perfusion  Dysphagia - TF via Cortrak - Failed SLP/swallow eval multiple times  T2DM w/ hyperglycemia and hypoglycemia, brittle - Basal Semglee 5U daily - SSI - CBGs Q4H - Goal CBG 140-180  Anemia and thrombocytopenia critical illness S/p 5 units total PRBC. - Trend H&H - Monitor for signs of active bleeding - Transfuse for Hgb < 7.0 or hemodynamically significant bleeding - 1U PRBCs given 11/5AM for Hgb 6.8.  Incidental finding of Liver steatosis vs. liver mass CT A/P showing 7 x 4 cm density in anterior liver. - Outpatient surveillance/GI follow up  Best Practice: (right click and "Reselect all SmartList Selections" daily)   Diet/type: tubefeeds DVT prophylaxis: Heparin gtt GI prophylaxis: N/A Lines: Central line, Dialysis Catheter, Arterial Line, and yes and it is still needed Foley:  Yes, and  it is still needed Code Status:  full code Last date of multidisciplinary goals of care discussion: Continue to update patient and family daily  Signature:   Faythe Ghee [facility] 08/03/23 7:33 AM  Please see [email or web address] for pager details.  From 7A-7P if no response, please call [phone] After hours, please call ELink [phone]

## 2023-08-03 NOTE — Progress Notes (Signed)
eLink Physician-Brief Progress Note Patient Name: [name] DOB: 1981-08-20 MRN: [id number]   Date of Service  08/03/2023  HPI/Events of Note  42 yo, M admitted for septic shock, prolonged hospital stay for infective endocarditis complicated by stroke and persistent hypotension   Hemoglobin slowly trickling down.  This morning, down to 6.8.  No active signs of bleeding.  On a heparin infusion for DVTs  eICU Interventions  Type and screen, 1 unit PRBC     Intervention Category Intermediate Interventions: Bleeding - evaluation and treatment with blood products  Richardo Popoff 08/03/2023, 4:07 AM

## 2023-08-03 NOTE — Progress Notes (Signed)
Nutrition Follow-up  DOCUMENTATION CODES:   Severe malnutrition in context of chronic illness  INTERVENTION:   -Continue tube feeding via Cortrak: Nepro Carb Steady recommend decrease rate  to  55 ml/h (1320  ml per day)  Prosource TF20 60 ml decreased to daily Free water flushes of 30 mL q4 hours for tube patency.  Provides 2536 kcal, 127 gm protein, 960 ml free water daily  -Continue nephro-vite daily via tube for micronutrient support while on dialysis.   -Continue -1 packet Juven BID, each packet provides 95 calories, 2.5 grams of protein (collagen), and 9.8 grams of carbohydrate (3 grams sugar); also contains 7 grams of L-arginine and L-glutamine, 300 mg vitamin C, 15 mg vitamin E, 1.2 mcg vitamin B-12, 9.5 mg zinc, 200 mg calcium, and 1.5 g  Calcium Beta-hydroxy-Beta-methylbutyrate to support wound healing  -Continue Banatrol 60 mL BID via tube.   NUTRITION DIAGNOSIS:   Severe Malnutrition related to chronic illness as evidenced by severe fat depletion, severe muscle depletion. -being addressed with TF regimen   GOAL:   Patient will meet greater than or equal to 90% of their needs -progressing with TF regimen   MONITOR:   Labs, I & O's, Weight trends, Diet advancement, TF tolerance, Skin  REASON FOR ASSESSMENT:   Follow up    ASSESSMENT:   42 y/o male with h/o CHF, ESRD on HD, DM, R AKA and gastroparesis who is admitted with DKA, AMS, septic shock, MRSA bacteremia, endocarditis of mitral valve, cavitary pneumonia and combined systolic/diastolic heart failure with cardiogenic shock complicatedby CVA noted on 10/17, thrombocytopenia and liver mass.  Enteral formula was changed from Osmolite 1.5 to Nephro Carb Steady on 11/3.  No issues with tolerance per RN. Patient with altered mentation.  Rectal tube with output of approximately 300 mL current 12 hrs.  Weights fluctuations throughout admission, although no significant weight change this admit. Edema 2+  generalized, RUE (11/4)  Labs:  Intake/Output Summary (Last 24 hours) at 08/03/2023 1457 Last data filed at 08/03/2023 0810 Gross per 24 hour  Intake 1533.32 ml  Output 1635 ml  Net -101.68 ml   Net IO Since Admission: 12,838.73 mL [08/03/23 1457]    Nutritionally Relevant Medications: Scheduled Meds:  calcitRIOL  1.5 mcg Per Tube Q M,W,F   Chlorhexidine Gluconate Cloth  6 each Topical Q0600   darbepoetin (ARANESP) injection - DIALYSIS  40 mcg Subcutaneous Q Fri-1800   feeding supplement (PROSource TF20)  60 mL Per Tube BID   fiber supplement (BANATROL TF)  60 mL Per Tube BID   gabapentin  300 mg Per Tube QHS   insulin aspart  0-9 Units Subcutaneous Q4H   insulin glargine-yfgn  5 Units Subcutaneous Daily   lidocaine  1 Application Topical Once   midodrine  20 mg Per Tube Q8H   multivitamin  1 tablet Per Tube BID   nutrition supplement (JUVEN)  1 packet Per Tube BID BM   mouth rinse  15 mL Mouth Rinse 4 times per day   QUEtiapine  50 mg Per Tube QHS   QUEtiapine  50 mg Per Tube Q0600   Continuous Infusions:  anticoagulant sodium citrate     feeding supplement (NEPRO CARB STEADY) 65 mL/hr at 08/03/23 0800   heparin 1,650 Units/hr (08/03/23 1245)   vancomycin Stopped (08/02/23 1937)   PRN Meds:.acetaminophen, albuterol, alteplase, anticoagulant sodium citrate, clonazePAM, feeding supplement (NEPRO CARB STEADY), Gerhardt's butt cream, heparin, lidocaine (PF), lidocaine-prilocaine, LORazepam, mouth rinse, oxyCODONE, pentafluoroprop-tetrafluoroeth     Diet  Order:   Diet Order             Diet NPO time specified  Diet effective now                   EDUCATION NEEDS:   Not appropriate for education at this time  Skin:  Skin Assessment: Skin Integrity Issues: Skin Integrity Issues:: Other (Comment) Other: skin tear to sacrum-small open red area in between buttocks, red abrasion to left buttocks  Last BM:  08/03/23  Height:   Ht Readings from Last 1 Encounters:   07/19/23 5\' 7"  (1.702 m)    Weight:   Wt Readings from Last 1 Encounters:  08/03/23 74.9 kg    Ideal Body Weight:  61.9 kg (adjusted for L AKA)  BMI:  Body mass index is 25.86 kg/m.  Estimated Nutritional Needs:   Kcal:  2200-2500kcal/day  Protein:  120-135g/day  Fluid:  UOP +1L    Alvino Chapel, RDLD Clinical Dietitian See AMION for contact information

## 2023-08-03 NOTE — Progress Notes (Signed)
PHARMACY - ANTICOAGULATION CONSULT NOTE  Pharmacy Consult for heparin Indication: DVT  No Known Allergies  Patient Measurements: Height: 5\' 7"  (170.2 cm) Weight: 74.9 kg (165 lb 2 oz) IBW/kg (Calculated) : 66.1 Heparin Dosing Weight: 75 kg  Vital Signs: Temp: 100.2 F (37.9 C) (11/05 2153) Temp Source: Oral (11/05 2153) BP: 146/95 (11/05 2153) Pulse Rate: 133 (11/05 2153)  Labs: Recent Labs    08/01/23 0331 08/02/23 0410 08/02/23 2315 08/03/23 0324 08/03/23 1042 08/03/23 2154  HGB 7.6* 7.0*  --  6.8*  --   --   HCT 25.2* 23.5*  --  22.1*  --   --   PLT 304 312  --  324  --   --   HEPARINUNFRC  --   --  0.19*  --  0.11* 0.16*  CREATININE 4.54* 6.02*  --  3.82*  --   --     Estimated Creatinine Clearance: 23.3 mL/min (A) (by C-G formula based on SCr of 3.82 mg/dL (H)).   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, with necrosis of bone ([name])    Dialysis patient ([name])    ESRD (end stage renal disease) ([name])    Hypertension    Sepsis ([name])     Medications:  Infusions:   anticoagulant sodium citrate     feeding supplement (NEPRO CARB STEADY) Stopped (08/03/23 2144)   heparin 1,650 Units/hr (08/03/23 1600)   vancomycin Stopped (08/02/23 1937)    Assessment: 42 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  Has required multiple blood transfusions (5 units total, last 11/5 for Hgb 6.8).  Pharmacy consulted to dose heparin.  No anticoagulation PTA.    Heparin level 0.16 is subtherapeutic on 1650 units/hr.  No issues with infusion or bleeding per RN.  Goal of Therapy:  Heparin level 0.3-0.7 units/ml Monitor platelets by anticoagulation  protocol: Yes   Plan:  Increase heparin to 1850 units/hr Daily heparin level, CBC - monitor closely with ongoing transfusions   Alphia Moh, PharmD, BCPS, BCCP Clinical Pharmacist  Please check AMION for all [facility] Pharmacy phone numbers After 10:00 PM, call Main Pharmacy [phone]

## 2023-08-03 NOTE — Progress Notes (Signed)
PHARMACY - ANTICOAGULATION CONSULT NOTE  Pharmacy Consult for heparin Indication: DVT  No Known Allergies  Patient Measurements: Height: 5\' 7"  (170.2 cm) Weight: 74.9 kg (165 lb 2 oz) IBW/kg (Calculated) : 66.1 Heparin Dosing Weight: TBW (74 kg)  Vital Signs: Temp: 101.1 F (38.4 C) (11/05 0835) Temp Source: Oral (11/05 0835) BP: 112/91 (11/05 0800) Pulse Rate: 138 (11/05 1100)  Labs: Recent Labs    08/01/23 0331 08/02/23 0410 08/02/23 2315 08/03/23 0324 08/03/23 1042  HGB 7.6* 7.0*  --  6.8*  --   HCT 25.2* 23.5*  --  22.1*  --   PLT 304 312  --  324  --   HEPARINUNFRC  --   --  0.19*  --  0.11*  CREATININE 4.54* 6.02*  --  3.82*  --     Estimated Creatinine Clearance: 23.3 mL/min (A) (by C-G formula based on SCr of 3.82 mg/dL (H)).   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, with necrosis of bone ([name])    Dialysis patient ([name])    ESRD (end stage renal disease) ([name])    Hypertension    Sepsis ([name])     Medications:  Infusions:   anticoagulant sodium citrate     feeding supplement (NEPRO CARB STEADY) 65 mL/hr at 08/03/23 0800   heparin 1,400 Units/hr (08/03/23 0804)   vancomycin Stopped (08/02/23 1937)    Assessment: 42 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  Has required multiple blood transfusions (5 units total, last 11/5 for Hgb 6.8).  Pharmacy consulted to dose heparin.  No anticoagulation PTA.    Heparin level 0.11 remains subtherapeutic after rate increase to 1400 units/hr.  Hgb 6.8 s/p 1 unit PRBC this morning, pltc 324 (stable).  No overt bleeding noted per RN.  Goal of Therapy:  Heparin level 0.3-0.7  units/ml Monitor platelets by anticoagulation protocol: Yes   Plan:  Inc heparin to 1650 units/hr 8 hour heparin level Daily heparin level, CBC - monitor closely with ongoing transfusions   Trixie Rude, PharmD Clinical Pharmacist 08/03/2023  11:40 AM

## 2023-08-03 NOTE — Progress Notes (Signed)
eLink Physician-Brief Progress Note Patient Name: [name] DOB: 1980/11/01 MRN: [id number]   Date of Service  08/03/2023  HPI/Events of Note  42 year old male with endocarditis and encephalopathy who has been intermittently febrile calling about fever of 101.  His feeding tube is clogged and has a Flexi-Seal in place for frequent stooling.  Multiple meds pending-gabapentin, midodrine, Seroquel via tube.  eICU Interventions  IV Tylenol x 1.  Given the degree of midodrine he is on, would recommend replacement of NG tube tonight.  In the interim, Haldol as needed for agitation.  IV Tylenol x 1.  Add as needed Dilaudid as an alternative to enteral oxycodone.   0141 -hypoglycemic episode CBG 50.  Hypoglycemia protocol ordered.  NG tube placed-will restart tube feeding now.  Not currently on any IV fluids-hold for the time being en lieu of enteral nutrition.  Intervention Category Minor Interventions: Routine modifications to care plan (e.g. PRN medications for pain, fever)  [name] 08/03/2023, 9:24 PM

## 2023-08-03 NOTE — Progress Notes (Signed)
Patient ID: [name], male   DOB: [birth year], 42 y.o.   MRN: [id number] [facility] Progress Note   Assessment/ Plan:   1.  MRSA bacteremia with mitral valve endocarditis: Suspected to be related to catheter related bacteremia and is status post line holiday with new left femoral TDC placed by IR on 10/18 (has occlusion of bilateral subclavian veins limiting his options for dialysis access).  Remains on vancomycin and cefepime IV. 2.  Acute ischemic CVA: Secondary to septic infarct on 10/17 3. ESRD: Usually on a Monday/Wednesday/Friday schedule and underwent hemodialysis yesterday/overnight without acute events.  Earlier on CRRT this admission while hemodynamically unstable.  Will order for next hemodialysis again tomorrow. 4. Anemia: Hemoglobin/hematocrit trending down, will restart ESA. 5. CKD-MBD: Calcium and phosphorus levels currently acceptable level, monitor on renal diet.  On calcitriol for PTH control. 6. Nutrition: Continue tube feeds via NG tube as mental status improves and allows for reevaluation of swallow. 7. Hypotension: Weaned off of pressors, blood pressures currently within acceptable range.  Subjective:   Appears comfortable resting in bed, confused upon conversation and perseverates on certain words.   Objective:   BP (!) 112/91   Pulse (!) 140   Temp (!) 101.1 F (38.4 C) (Oral)   Resp 19   Ht 5\' 7"  (1.702 m)   Wt 74.9 kg   SpO2 92%   BMI 25.86 kg/m   Physical Exam: Gen: Awake/alert, appears comfortable resting in bed.  NGT in place CVS: Regular tachycardia, S1 and S2 Resp: Transmitted breath sounds, no distinct rales or rhonchi Abd: Soft, flat, nontender, bowel sounds normal Ext: Status post left AKA, no edema right leg.  Left femoral TDC in place  Labs: BMET Recent Labs  Lab 07/27/23 1627 07/27/23 1628 07/28/23 0356 07/28/23 4098 07/29/23 1191 07/29/23 2253 07/30/23 0406 07/31/23 0358 08/01/23 0331 08/02/23 0410 08/03/23 0324   NA 138   < > 139   < > 136 135 138 133* 133* 134* 133*  K 3.8   < > 4.3   < > 5.7* 5.8* 5.4* 4.9 5.5* 5.5* 4.3  CL 90*   < > 92*  --  92*  --  95* 94* 92* 90* 93*  CO2 38*  --  33*  --  34*  --  30 26 28 29 28   GLUCOSE 149*   < > 173*  --  131*  --  185* 314* 144* 234* 201*  BUN 26*   < > 27*  --  53*  --  81* 45* 80* 108* 62*  CREATININE 1.66*   < > 1.64*  --  3.29*  --  4.78* 3.14* 4.54* 6.02* 3.82*  CALCIUM 9.7  --  9.7  --  9.1  --  9.1 8.3* 8.8* 9.2 9.0  PHOS 2.0*  --  2.5  --   --   --  4.4  --  5.5* 6.0* 3.5   < > = values in this interval not displayed.   CBC Recent Labs  Lab 07/31/23 0358 08/01/23 0331 08/02/23 0410 08/03/23 0324  WBC 17.1* 20.0* 17.9* 17.5*  HGB 7.8* 7.6* 7.0* 6.8*  HCT 26.2* 25.2* 23.5* 22.1*  MCV 98.5 99.2 100.0 97.8  PLT 229 304 312 324    Medications:     calcitRIOL  1.5 mcg Per Tube Q M,W,F   Chlorhexidine Gluconate Cloth  6 each Topical Q0600   darbepoetin (ARANESP) injection - DIALYSIS  40 mcg Subcutaneous Q Fri-1800   feeding  supplement (PROSource TF20)  60 mL Per Tube BID   fiber supplement (BANATROL TF)  60 mL Per Tube BID   gabapentin  300 mg Per Tube QHS   insulin aspart  0-9 Units Subcutaneous Q4H   insulin glargine-yfgn  5 Units Subcutaneous Daily   lidocaine  1 Application Topical Once   midodrine  20 mg Per Tube Q8H   multivitamin  1 tablet Per Tube BID   nutrition supplement (JUVEN)  1 packet Per Tube BID BM   mouth rinse  15 mL Mouth Rinse 4 times per day   QUEtiapine  50 mg Per Tube QHS   QUEtiapine  50 mg Per Tube [id number]   Zetta Bills, MD 08/03/2023, 8:52 AM

## 2023-08-03 NOTE — Progress Notes (Signed)
PHARMACY - ANTICOAGULATION CONSULT NOTE  Pharmacy Consult for heparin Indication: DVT  No Known Allergies  Patient Measurements: Height: 5\' 7"  (170.2 cm) Weight: 73.8 kg (162 lb 11.2 oz) IBW/kg (Calculated) : 66.1 Heparin Dosing Weight: TBW (74 kg)  Vital Signs: Temp: 101.4 F (38.6 C) (11/04 2352) Temp Source: Axillary (11/04 2352) BP: 92/59 (11/05 0000) Pulse Rate: 138 (11/05 0000)  Labs: Recent Labs    07/31/23 0358 08/01/23 0331 08/02/23 0410 08/02/23 2315  HGB 7.8* 7.6* 7.0*  --   HCT 26.2* 25.2* 23.5*  --   PLT 229 304 312  --   HEPARINUNFRC  --   --   --  0.19*  CREATININE 3.14* 4.54* 6.02*  --     Estimated Creatinine Clearance: 14.8 mL/min (A) (by C-G formula based on SCr of 6.02 mg/dL (H)).   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, with necrosis of bone ([name])    Dialysis patient ([name])    ESRD (end stage renal disease) ([name])    Hypertension    Sepsis ([name])     Medications:  Infusions:   anticoagulant sodium citrate     feeding supplement (NEPRO CARB STEADY) 65 mL/hr at 08/03/23 0000   heparin 1,200 Units/hr (08/03/23 0000)   norepinephrine (LEVOPHED) Adult infusion Stopped (08/02/23 1051)   vancomycin Stopped (08/02/23 1937)    Assessment: 42 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  Has required multiple blood transfusions (4 units total, last 10/28 for Hgb 6.6).  Pharmacy consulted to dose heparin.  No anticoagulation PTA.    Hgb 7.0, pltc 312.    11/5 AM update:  Heparin level sub-therapeutic   Goal of Therapy:  Heparin level 0.3-0.7 units/ml Monitor platelets by anticoagulation protocol: Yes   Plan:   Inc heparin to 1400 units/hr Heparin level in 8 hours Watch CBC closely   Abran Duke, PharmD, BCPS Clinical Pharmacist Phone: [phone]

## 2023-08-04 ENCOUNTER — Inpatient Hospital Stay (HOSPITAL_COMMUNITY): Payer: Medicare HMO

## 2023-08-04 DIAGNOSIS — I69391 Dysphagia following cerebral infarction: Secondary | ICD-10-CM

## 2023-08-04 DIAGNOSIS — J189 Pneumonia, unspecified organism: Secondary | ICD-10-CM

## 2023-08-04 DIAGNOSIS — R4182 Altered mental status, unspecified: Secondary | ICD-10-CM | POA: Diagnosis not present

## 2023-08-04 DIAGNOSIS — I059 Rheumatic mitral valve disease, unspecified: Secondary | ICD-10-CM | POA: Diagnosis not present

## 2023-08-04 DIAGNOSIS — J984 Other disorders of lung: Secondary | ICD-10-CM

## 2023-08-04 DIAGNOSIS — K760 Fatty (change of) liver, not elsewhere classified: Secondary | ICD-10-CM

## 2023-08-04 LAB — CBC
HCT: 26.8 % — ABNORMAL LOW (ref 39.0–52.0)
Hemoglobin: 8.1 g/dL — ABNORMAL LOW (ref 13.0–17.0)
MCH: 28.6 pg (ref 26.0–34.0)
MCHC: 30.2 g/dL (ref 30.0–36.0)
MCV: 94.7 fL (ref 80.0–100.0)
Platelets: 362 10*3/uL (ref 150–400)
RBC: 2.83 MIL/uL — ABNORMAL LOW (ref 4.22–5.81)
RDW: 17.9 % — ABNORMAL HIGH (ref 11.5–15.5)
WBC: 16.9 10*3/uL — ABNORMAL HIGH (ref 4.0–10.5)
nRBC: 0 % (ref 0.0–0.2)

## 2023-08-04 LAB — GLUCOSE, CAPILLARY
Glucose-Capillary: 50 mg/dL — ABNORMAL LOW (ref 70–99)
Glucose-Capillary: 116 mg/dL — ABNORMAL HIGH (ref 70–99)
Glucose-Capillary: 166 mg/dL — ABNORMAL HIGH (ref 70–99)
Glucose-Capillary: 199 mg/dL — ABNORMAL HIGH (ref 70–99)
Glucose-Capillary: 197 mg/dL — ABNORMAL HIGH (ref 70–99)
Glucose-Capillary: 88 mg/dL (ref 70–99)

## 2023-08-04 LAB — TYPE AND SCREEN
ABO/RH(D): B POS
Antibody Screen: NEGATIVE
Unit division: 0

## 2023-08-04 LAB — BPAM RBC
Blood Product Expiration Date: 202411212359
ISSUE DATE / TIME: 202411050523
Unit Type and Rh: 7300

## 2023-08-04 LAB — BASIC METABOLIC PANEL
Anion gap: 15 (ref 5–15)
BUN: 107 mg/dL — ABNORMAL HIGH (ref 6–20)
CO2: 25 mmol/L (ref 22–32)
Calcium: 9.2 mg/dL (ref 8.9–10.3)
Chloride: 92 mmol/L — ABNORMAL LOW (ref 98–111)
Creatinine, Ser: 5.23 mg/dL — ABNORMAL HIGH (ref 0.61–1.24)
GFR, Estimated: 13 mL/min — ABNORMAL LOW (ref >60)
Glucose, Bld: 158 mg/dL — ABNORMAL HIGH (ref 70–99)
Potassium: 4.5 mmol/L (ref 3.5–5.1)
Sodium: 132 mmol/L — ABNORMAL LOW (ref 135–145)

## 2023-08-04 LAB — MAGNESIUM: Magnesium: 2.3 mg/dL (ref 1.7–2.4)

## 2023-08-04 LAB — HEPARIN LEVEL (UNFRACTIONATED)
Heparin Unfractionated: 0.19 [IU]/mL — ABNORMAL LOW (ref 0.30–0.70)
Heparin Unfractionated: 0.34 [IU]/mL (ref 0.30–0.70)
Heparin Unfractionated: 0.13 [IU]/mL — ABNORMAL LOW (ref 0.30–0.70)

## 2023-08-04 LAB — PHOSPHORUS: Phosphorus: 4.6 mg/dL (ref 2.5–4.6)

## 2023-08-04 MED ORDER — DEXTROSE 50 % IV SOLN
25.0000 g | INTRAVENOUS | Status: AC
  Administered 2023-08-04: 25 g via INTRAVENOUS
  Filled 2023-08-04: qty 50

## 2023-08-04 MED ORDER — RENA-VITE PO TABS
1.0000 | ORAL_TABLET | Freq: Every day | ORAL | Status: DC
  Administered 2023-08-05 – 2023-08-12 (×8): 1
  Filled 2023-08-04 (×8): qty 1

## 2023-08-04 NOTE — Progress Notes (Signed)
PHARMACY - ANTICOAGULATION CONSULT NOTE  Pharmacy Consult for heparin Indication: DVT  No Known Allergies  Patient Measurements: Height: 5\' 7"  (170.2 cm) Weight: 82.8 kg (182 lb 8.7 oz) IBW/kg (Calculated) : 66.1 Heparin Dosing Weight: 75 kg  Vital Signs: Temp: 98.6 F (37 C) (11/06 0231) Temp Source: Oral (11/06 0231) BP: 115/72 (11/06 0231) Pulse Rate: 110 (11/06 0231)  Labs: Recent Labs    08/02/23 0410 08/02/23 2315 08/03/23 0324 08/03/23 1042 08/03/23 2154 08/04/23 0406  HGB 7.0*  --  6.8*  --   --  8.1*  HCT 23.5*  --  22.1*  --   --  26.8*  PLT 312  --  324  --   --  362  HEPARINUNFRC  --    < >  --  0.11* 0.16* 0.19*  CREATININE 6.02*  --  3.82*  --   --  5.23*   < > = values in this interval not displayed.    Estimated Creatinine Clearance: 18.8 mL/min (A) (by C-G formula based on SCr of 5.23 mg/dL (H)).   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, with necrosis of bone ([name])    Dialysis patient ([name])    ESRD (end stage renal disease) ([name])    Hypertension    Sepsis ([name])     Medications:  Infusions:   anticoagulant sodium citrate     feeding supplement (NEPRO CARB STEADY) 1,000 mL (08/04/23 0214)   heparin 2,000 Units/hr (08/04/23 0631)   vancomycin Stopped (08/02/23 1937)    Assessment: 42 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  Has required multiple blood transfusions (5 units total, last 11/5 for Hgb 6.8).  Pharmacy consulted to dose heparin.  No anticoagulation PTA.    11/6 PM: Heparin level therapeutic at 0.34 with heparin running at 2000 units/hour. Hgb improved to 8.1- no issues with heparin infusion  noted. RN noted there was some bleeding from cortrak placement, which has since subsided.   Goal of Therapy:  Heparin level 0.3-0.7 units/ml Monitor platelets by anticoagulation protocol: Yes   Plan:  Continue heparin at 2,000 units/hour  Confirmatory heparin level in 8 hours  Daily heparin level, CBC - monitor closely with ongoing transfusions   Jani Gravel, PharmD Clinical Pharmacist  08/04/2023 7:31 AM

## 2023-08-04 NOTE — Progress Notes (Signed)
Physical Therapy Treatment Patient Details Name: [name] MRN: [id number] DOB: 08-31-1981 Today's Date: 08/04/2023   History of Present Illness Patient is a 42 y/o male admitted 07/09/23 with confusion found to be in septic shock found to have MRSA in blood cultures, in DKA, TEE positive for vegitation on multiple valves, MRI showing R MCA infarct.  Pt with resp failure on 10/21 resulting in intubation and transfer to [facility]; extubated 10/24.  CRRT started 10/22-stopped 10/30.  PMH positive for ESRD on HD, IDDM, gastroparesis, L AKA, HTN.    PT Comments  Patient s/p meds due to agitation with nursing.  Still aroused fairly easily, but with limited communication perseverating on a phrase though communicating through head nods but still not following commands.  He was soiled with BM despite flexiseal.  Repositioned in bed after hygiene for improved upright positioning and for L arm elevation due to significant edema.  May benefit from compression sleeve.  PT will continue to follow.    If plan is discharge home, recommend the following: Two people to help with walking and/or transfers;Assist for transportation;Supervision due to cognitive status;Two people to help with bathing/dressing/bathroom;Help with stairs or ramp for entrance   Can travel by private vehicle     No  Equipment Recommendations  Other (comment) (TBA)    Recommendations for Other Services       Precautions / Restrictions Precautions Precautions: Fall Precaution Comments: LAKA, L hemiparesis, R wrist restraint, flexiseal, coretrak     Mobility  Bed Mobility Overal bed mobility: Needs Assistance Bed Mobility: Rolling Rolling: Max assist, +2 for physical assistance         General bed mobility comments: rolled R for assist with bed pad change and hygiene as flexiseal leaking (RN aware); scooted up in bed with +2 A; pt declining up to EOB    Transfers                        Ambulation/Gait                    Stairs             Wheelchair Mobility     Tilt Bed    Modified Rankin (Stroke Patients Only) Modified Rankin (Stroke Patients Only) Pre-Morbid Rankin Score: Moderate disability Modified Rankin: Severe disability     Balance                                            Cognition Arousal: Lethargic Behavior During Therapy:  (mildly agitated; had Haldol and Xanax) Overall Cognitive Status: Impaired/Different from baseline Area of Impairment: Orientation, Attention, Memory, Following commands, Safety/judgement, Problem solving                 Orientation Level: Disoriented to, Situation, Time Current Attention Level: Focused Memory: Decreased short-term memory Following Commands: Follows one step commands inconsistently, Follows one step commands with increased time     Problem Solving: Slow processing, Decreased initiation, Difficulty sequencing          Exercises Other Exercises Other Exercises: PROM L UE shoulder D2 pattern, elbow flex/ext; pron/sup and wrist & finger flex/ext; noted edema throughout and propped on pillow and towels    General Comments General comments (skin integrity, edema, etc.): HR 120-130 in supine, pt repeating over and over "Lord have mercy"; but did  respond to questions at times with head nod or shake to indicate agreeable to movement      Pertinent Vitals/Pain Pain Assessment Pain Assessment: Faces Faces Pain Scale: Hurts even more Pain Location: generalized Pain Descriptors / Indicators: Discomfort, Moaning Pain Intervention(s): Monitored during session, Limited activity within patient's tolerance    Home Living                          Prior Function            PT Goals (current goals can now be found in the care plan section) Progress towards PT goals: Not progressing toward goals - comment    Frequency    Min 1X/week      PT Plan      Co-evaluation               AM-PAC PT "6 Clicks" Mobility   Outcome Measure  Help needed turning from your back to your side while in a flat bed without using bedrails?: Total Help needed moving from lying on your back to sitting on the side of a flat bed without using bedrails?: Total Help needed moving to and from a bed to a chair (including a wheelchair)?: Total Help needed standing up from a chair using your arms (e.g., wheelchair or bedside chair)?: Total Help needed to walk in hospital room?: Total Help needed climbing 3-5 steps with a railing? : Total 6 Click Score: 6    End of Session   Activity Tolerance: Other (comment) (limited due to flexiseal leaking and pt cooperation) Patient left: in bed;with call bell/phone within reach   PT Visit Diagnosis: Other abnormalities of gait and mobility (R26.89);Other symptoms and signs involving the nervous system (R29.898);Hemiplegia and hemiparesis Hemiplegia - Right/Left: Left Hemiplegia - dominant/non-dominant: Non-dominant Hemiplegia - caused by: Cerebral infarction     Time: 1436-1500 PT Time Calculation (min) (ACUTE ONLY): 24 min  Charges:    $Therapeutic Activity: 23-37 mins PT General Charges $$ ACUTE PT VISIT: 1 Visit                     Sheran Lawless, PT Acute Rehabilitation Services [phone] 08/04/2023    Elray Mcgregor 08/04/2023, 4:31 PM

## 2023-08-04 NOTE — Progress Notes (Signed)
[facility]  PROGRESS NOTE  [name] NUU:[id number] DOB: 1981-01-23 DOA: 07/09/2023 PCP: [name], NP   Brief HPI:    42 yo M w/ pertinent PMH ESRD on HD MWF, IDDM, gastroparesis, left AKA, HTN presents to [facility] on 10/11 w/ AMS.   Patient recently admitted to [facility] on 7/12 sepsis from right diabetic foot ulcer. Cultures w/ MRSA. Patient had dialysis catheter removed and replaced. ID treated w/ vanc. Patient refused echo and left on 7/17 against medical advise.    On 10/11 patient was found by family member on ground altered. EMS arrived and dialysis catheter partially removed. Patient more alert for them. Has been complaining of diarrhea x1 week per family. Unsure if patient went to dialysis today. Transferred to [facility] ED. On arrival bp stable 107/70 and afebrile. Patient remains confused and occasionally with agitation. Given dilaudid. CBG 497. Beta H >8. VBG ph 7.06, 16, 54, 4.7. Given iv fluids and started on insulin drip for DKA. CT head negative for acute abnormality. Cxr no significant findings. CT abd/pelvis no acute findings; possible steatosis vs. Liver mass 7 x 4 cm recommending f/u w/ MRI. WBC 20.7 and LA 2.4. Sepsis protocol initiated. Cultures obtained and started on vanc/zosyn. Given ams, dka, and soft bp pccm consulted for icu admission.   10/21 pccm called overnight to assess for worsening hypoxia and AMS after vomiting. On NRB w/ sats 70-80s. Hypoglycemic given dextrose which improved his mental status some. Transferred to 2H icu for intubation.   Patient was extubated, transferred to progressive care unit under [facility] service on 08/04/2023  10/12: altered mental status, DKA 10/12: blood cultures positive for MRSA 10/13 TTE LVEF 25-30% w/ severely depressed LVF, global hypokinesis, gd I diastolic dysfxn, mod RV fxn reduction. Possible filamentous structure on MV 10/16 TEE LVEF 30-35%RV fxn nml. Large mass (2.00 cm x 0.88 cm ) on the posterior mitral leaflet which  appeared to be extending in the mitral annulus. There are two mobile masses visible on the anterior leaflet of the tricupid valve which also appears to have a perforation. Another mobile  mass ( 0.68 cm x 0.67 cm) on the posterior tricupid leaflet. Moderate Tricuspid regurgitation which appears to  be through the leaflet perforation  10/16 seen by Cardiothoracic surg. Felt poor candidate. Recommended 6 weeks abx and recommended repeat TEE 1 week 10/17 noting left arm weakness. Not clear when started.  MRI showed Right MCA infarct w/ acute infarcts in left occipital and bilateral cerebellum felt c/w septic emboli. Neuro consulted.  10/18 tunneled left femoral vasc cath placed after catheter holiday  10/20 palliative care consulted later that day worsening hypoxia. Sats 70s. After vomiting. More lethargic  10/21: resp failure requiring intubation shortly after midnight  10/22- TEE 10/22 with EF 35%, LV global hypokinesis. Very large vegetation lateral scallop of posterior mitral leaflet 3.9cm length 1 cm width, highly mobile, prolapsing into LV. No large vegetation seen on TV.  Later that afternoon and abrupt hemodynamic changes requiring escalation of vasopressor support.  Felt given the size of his vegetation and increased bulk of the vegetation infections unlikely to clear.  He was felt to be decompensation may have due to of breath leaflet perforation and acute mitral valve regurgb 10/23: per nursing less responsive than yesterday, increased ETT secretions. Clotting off CRRT. Ct head showing expected evolution of the right MCA stroke.  Cardiothoracic surgery re-consulted for worsening MVE. Felt too high risk here. Recommended referral to tertiary care  10/24 agitated when  sedation decreased, is purposeful but does not follow commands. Tolerating UF gaols of -150. Still on NE infusion but extubated later that afternoon after passing SBT 10/25 failed swallow eval still on just 3 lpm O2. Encephalopathic.  Added seroquel in effort to get him off the precedex. Cardiology told no beds at [facility]. Not clear if accepted or not. Worsening pressor requirements. Got blood for hgb 6. Cefepime resumed for clinical decline and leukocytosis  10/26 Hemoglobin again dropped to 6.3 overnight with increased pressor requirement yesterday afternoon now on vaso and levo. 10/27 Hypoglycemic episode overnight, mental status improved a little over weekend  10/28 Getting another unit of blood more awake.  Oriented x 2.  Still on Precedex.  Having liquid stools. 10/30 Off Precedex, low-dose levo.  Plan to transition to IHD 10/29 Cefepime for aspiration course completed.  10/31 Seen by overnight team for hypoxia and somnolence ABG with mild hypercapnia. Again declined by [facility] because they were at capacity. More awake. Doing better w/ Bed side swallow but still likely aspirating. CRRT stopped.  11/1 Adjustments made for better glycemic control.  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 - Low dose metoprolol held in the setting of hypotension, consider slow resumption -  Definitive management is valve repair/replacement - Cardiology/TCTS consulted, recommend transfer to tertiary care center due to high-risk patient/procedure - [facility] has denied transfer x 2 (?capacity), [facility] denied for multiple prohibitive surgical risks   Ongoing sinus tachycardia, ?r/t LUE DVT Heart rate remains 130s to 140s. - Cardiac monitoring - Optimize electrolytes for K > 4, Mg > 2 - Consider resumption of low-dose metoprolol, given ongoing tachycardic and improved BP   LUE DVT Dopplers 11/4 positive for L IJ, L [place], L axillary veins; septic versus VTE. 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 - Delirium precautions - Klonopin PRN - Continue Seroquel - PT/OT/SLP as able to participate in care   End-stage renal disease on hemodialysis (MWF at baseline) Has occluded bilateral [place] veins  Mild hyperkalemia off CRRT - Nephrology following, appreciate assistance - Successfully transitioned to [facility], next 11/6 - Trend BMP - Replete electrolytes as indicated - Monitor I&Os - Avoid nephrotoxic agents as able - Ensure adequate renal perfusion   Dysphagia - TF via Cortrak - Failed SLP/swallow eval multiple times   T2DM w/ hyperglycemia and hypoglycemia, brittle - Basal Semglee 5U daily - SSI - CBGs Q4H - Goal CBG 140-180   Anemia and thrombocytopenia critical illness S/p 5 units total  PRBC. - Trend H&H - Monitor for signs of active bleeding - Transfuse for Hgb < 7.0 or hemodynamically significant bleeding - 1U PRBCs given 11/5AM for Hgb 6.8.   Incidental finding of Liver steatosis vs. liver mass CT A/P showing 7 x 4 cm density in anterior liver. - Outpatient surveillance/GI follow up    Medications     calcitRIOL  1.5 mcg Per Tube Q  M,W,F   Chlorhexidine Gluconate Cloth  6 each Topical Q0600   darbepoetin (ARANESP) injection - DIALYSIS  40 mcg Subcutaneous Q Fri-1800   feeding supplement (PROSource TF20)  60 mL Per Tube Daily   fiber supplement (BANATROL TF)  60 mL Per Tube BID   gabapentin  300 mg Per Tube QHS   insulin aspart  0-9 Units Subcutaneous Q4H   insulin glargine-yfgn  5 Units Subcutaneous Daily   lidocaine  1 Application Topical Once   midodrine  20 mg Per Tube Q8H   [START ON 08/05/2023] multivitamin  1 tablet Per Tube QHS   nutrition supplement (JUVEN)  1 packet Per Tube BID BM   mouth rinse  15 mL Mouth Rinse 4 times per day   QUEtiapine  50 mg Per Tube QHS   QUEtiapine  50 mg Per Tube Q0600     Data Reviewed:   CBG:  Recent Labs  Lab 08/04/23 0122 08/04/23 0225 08/04/23 0615 08/04/23 1237 08/04/23 1639  GLUCAP 50* 116* 166* 199* 197*    SpO2: 94 % O2 Flow Rate (L/min): 2 L/min FiO2 (%): 32 %    Vitals:   08/04/23 1200 08/04/23 1207 08/04/23 1304 08/04/23 1641  BP: (!) 140/82 124/87 94/80 (!) 147/77  Pulse: (!) 132 (!) 128 (!) 133 (!) 126  Resp: (!) 21 (!) 21 20 (!) 24  Temp:  99.8 F (37.7 C) 98.9 F (37.2 C) 98.7 F (37.1 C)  TempSrc:   Axillary Oral  SpO2: 100% 96% 94% 94%  Weight:  81.5 kg    Height:          Data Reviewed:  Basic Metabolic Panel: Recent Labs  Lab 07/30/23 0406 07/31/23 0358 08/01/23 0331 08/02/23 0410 08/03/23 0324 08/04/23 0406  NA 138 133* 133* 134* 133* 132*  K 5.4* 4.9 5.5* 5.5* 4.3 4.5  CL 95* 94* 92* 90* 93* 92*  CO2 30 26 28 29 28 25   GLUCOSE 185* 314* 144* 234* 201* 158*  BUN 81* 45* 80* 108* 62* 107*  CREATININE 4.78* 3.14* 4.54* 6.02* 3.82* 5.23*  CALCIUM 9.1 8.3* 8.8* 9.2 9.0 9.2  MG  --   --  2.4 2.7* 2.0 2.3  PHOS 4.4  --  5.5* 6.0* 3.5 4.6    CBC: Recent Labs  Lab 07/31/23 0358 08/01/23 0331 08/02/23 0410 08/03/23 0324 08/04/23 0406  WBC 17.1* 20.0* 17.9* 17.5* 16.9*  HGB 7.8* 7.6* 7.0* 6.8* 8.1*  HCT 26.2*  25.2* 23.5* 22.1* 26.8*  MCV 98.5 99.2 100.0 97.8 94.7  PLT 229 304 312 324 362    LFT No results for input(s): "AST", "ALT", "ALKPHOS", "BILITOT", "PROT", "ALBUMIN" in the last 168 hours.   Antibiotics: Anti-infectives (From admission, onward)    Start     Dose/Rate Route Frequency Ordered Stop   08/02/23 1200  vancomycin (VANCOREADY) IVPB 750 mg/150 mL        750 mg 150 mL/hr over 60 Minutes Intravenous Every M-W-F (Hemodialysis) 07/31/23 1404     07/30/23 1800  vancomycin (VANCOREADY) IVPB 750 mg/150 mL  750 mg 150 mL/hr over 60 Minutes Intravenous Every M-W-F (Hemodialysis) 07/30/23 1358 07/30/23 1923   07/29/23 0812  vancomycin variable dose per unstable renal function (pharmacist dosing)  Status:  Discontinued         Does not apply See admin instructions 07/29/23 0813 07/31/23 1404   07/28/23 1000  vancomycin (VANCOREADY) IVPB 750 mg/150 mL  Status:  Discontinued        750 mg 150 mL/hr over 60 Minutes Intravenous Every 24 hours 07/27/23 1539 07/29/23 0813   07/27/23 1630  vancomycin (VANCOREADY) IVPB 500 mg/100 mL        500 mg 100 mL/hr over 60 Minutes Intravenous  Once 07/27/23 1539 07/27/23 1759   07/23/23 1945  ceFEPIme (MAXIPIME) 2 g in sodium chloride 0.9 % 100 mL IVPB  Status:  Discontinued        2 g 200 mL/hr over 30 Minutes Intravenous Every 12 hours 07/23/23 1856 07/27/23 1053   07/21/23 1200  vancomycin (VANCOREADY) IVPB 750 mg/150 mL  Status:  Discontinued        750 mg 150 mL/hr over 60 Minutes Intravenous Every M-W-F (Hemodialysis) 07/19/23 1106 07/20/23 0842   07/21/23 0230  vancomycin (VANCOREADY) IVPB 750 mg/150 mL  Status:  Discontinued        750 mg 150 mL/hr over 60 Minutes Intravenous Every 24 hours 07/20/23 1451 07/27/23 1314   07/20/23 2200  ceFEPIme (MAXIPIME) 2 g in sodium chloride 0.9 % 100 mL IVPB  Status:  Discontinued        2 g 200 mL/hr over 30 Minutes Intravenous Every 12 hours 07/20/23 1451 07/21/23 1108   07/19/23 2200  ceFEPIme  (MAXIPIME) 1 g in sodium chloride 0.9 % 100 mL IVPB  Status:  Discontinued        1 g 200 mL/hr over 30 Minutes Intravenous Every 24 hours 07/19/23 1409 07/20/23 1451   07/19/23 2000  ceFEPIme (MAXIPIME) 2 g in sodium chloride 0.9 % 100 mL IVPB  Status:  Discontinued        2 g 200 mL/hr over 30 Minutes Intravenous Every M-W-F 07/19/23 0110 07/19/23 1409   07/19/23 0200  ceFEPIme (MAXIPIME) 2 g in sodium chloride 0.9 % 100 mL IVPB        2 g 200 mL/hr over 30 Minutes Intravenous  Once 07/19/23 0110 07/19/23 0221   07/18/23 1015  vancomycin (VANCOREADY) IVPB 750 mg/150 mL        750 mg 150 mL/hr over 60 Minutes Intravenous  Once 07/18/23 0929 07/19/23 1137   07/16/23 0000  ceFAZolin (ANCEF) IVPB 2g/100 mL premix        2 g 200 mL/hr over 30 Minutes Intravenous To Radiology 07/15/23 1408 07/16/23 1250   07/14/23 1200  vancomycin (VANCOREADY) IVPB 750 mg/150 mL  Status:  Discontinued        750 mg 150 mL/hr over 60 Minutes Intravenous Every M-W-F (Hemodialysis) 07/12/23 1203 07/19/23 1106   07/11/23 2330  vancomycin (VANCOREADY) IVPB 750 mg/150 mL        750 mg 150 mL/hr over 60 Minutes Intravenous  Once 07/11/23 2030 07/12/23 0110   07/11/23 1800  vancomycin (VANCOREADY) IVPB 750 mg/150 mL  Status:  Discontinued        750 mg 150 mL/hr over 60 Minutes Intravenous  Once 07/11/23 0856 07/11/23 1810   07/10/23 2318  vancomycin variable dose per unstable renal function (pharmacist dosing)  Status:  Discontinued         Does  not apply See admin instructions 07/10/23 2319 07/12/23 1203   07/10/23 1429  vancomycin (VANCOREADY) IVPB 750 mg/150 mL  Status:  Discontinued        750 mg 150 mL/hr over 60 Minutes Intravenous Every Dialysis 07/10/23 1429 07/11/23 2030   07/10/23 1230  fidaxomicin (DIFICID) tablet 200 mg  Status:  Discontinued        200 mg Oral 2 times daily 07/10/23 1139 07/10/23 1207   07/10/23 1000  piperacillin-tazobactam (ZOSYN) IVPB 2.25 g  Status:  Discontinued        2.25  g 100 mL/hr over 30 Minutes Intravenous Every 8 hours 07/10/23 0908 07/10/23 1350   07/09/23 2330  vancomycin (VANCOREADY) IVPB 1750 mg/350 mL        1,750 mg 175 mL/hr over 120 Minutes Intravenous  Once 07/09/23 2329 07/10/23 0734   07/09/23 2330  piperacillin-tazobactam (ZOSYN) IVPB 3.375 g        3.375 g 100 mL/hr over 30 Minutes Intravenous  Once 07/09/23 2329 07/10/23 0240        DVT prophylaxis: Heparin  Code Status: Full code  Family Communication: No family at bedside   CONSULTS PCCM, nephrology, ID, CT surgery   Subjective   Patient is confused, not communicating   Objective    Physical Examination:   General: Appears confused, mild agitation Cardiovascular: \S1-S2, regular Respiratory: Lungs clear to auscultation bilaterally Abdomen: Soft, nontender, no organomegaly Extremities: Left AKA Neurologic: Alert, confused   Status is: Inpatient:             Meredeth Ide   [facility] If 7PM-7AM, please contact night-coverage at [email or web address], Office  [phone]   08/04/2023, 6:50 PM  LOS: 25 days

## 2023-08-04 NOTE — Progress Notes (Signed)
   08/04/23 1207  Vitals  Pulse Rate (!) 128  Resp (!) 21  BP 124/87  SpO2 96 %  O2 Device Room Air  Weight 81.5 kg  Type of Weight Post-Dialysis  Oxygen Therapy  Patient Activity (if Appropriate) In bed  Post Treatment  Dialyzer Clearance Clear  Hemodialysis Intake (mL) 0 mL  Liters Processed 83.8  Fluid Removed (mL) 2000 mL  Tolerated HD Treatment Yes   Received patient in bed to unit.  Alert and oriented.  Informed consent signed and in chart.   TX duration:3.5hr  Patient tolerated well.  Transported back to the room  Alert, without acute distress.  Hand-off given to patient's nurse.   Access used: [facility] Access issues: none  Total UF removed: 2L Medication(s) given: Vanc   [name]

## 2023-08-04 NOTE — Plan of Care (Signed)
  Problem: Self-Care: Goal: Ability to participate in self-care as condition permits will improve Outcome: Progressing Goal: Verbalization of feelings and concerns over difficulty with self-care will improve Outcome: Progressing Goal: Ability to communicate needs accurately will improve Outcome: Progressing   Problem: Nutrition: Goal: Risk of aspiration will decrease Outcome: Progressing Goal: Dietary intake will improve Outcome: Progressing   Problem: Education: Goal: Knowledge of disease or condition will improve Outcome: Progressing Goal: Knowledge of secondary prevention will improve (MUST DOCUMENT ALL) Outcome: Progressing Goal: Knowledge of patient specific risk factors will improve Loraine Leriche N/A or DELETE if not current risk factor) Outcome: Progressing   Problem: Ischemic Stroke/TIA Tissue Perfusion: Goal: Complications of ischemic stroke/TIA will be minimized Outcome: Progressing   Problem: Coping: Goal: Will verbalize positive feelings about self Outcome: Progressing Goal: Will identify appropriate support needs Outcome: Progressing   Problem: Health Behavior/Discharge Planning: Goal: Ability to manage health-related needs will improve Outcome: Progressing Goal: Goals will be collaboratively established with patient/family Outcome: Progressing   Problem: Self-Care: Goal: Ability to participate in self-care as condition permits will improve Outcome: Progressing Goal: Verbalization of feelings and concerns over difficulty with self-care will improve Outcome: Progressing Goal: Ability to communicate needs accurately will improve Outcome: Progressing   Problem: Nutrition: Goal: Risk of aspiration will decrease Outcome: Progressing Goal: Dietary intake will improve Outcome: Progressing   Problem: Safety: Goal: Non-violent Restraint(s) Outcome: Progressing

## 2023-08-04 NOTE — Procedures (Signed)
Cortrak  Person Inserting Tube:  Leslye Puccini T, RD Tube Type:  Cortrak - 43 inches Tube Size:  10 Tube Location:  Left nare Secured by: Bridle Technique Used to Measure Tube Placement:  Marking at nare/corner of mouth Cortrak Secured At:  65 cm   Cortrak Tube Team Note:  Consult received to place a Cortrak feeding tube.   X-ray is required, abdominal x-ray has been ordered by the Cortrak team. Please confirm tube placement before using the Cortrak tube.   If the tube becomes dislodged please keep the tube and contact the Cortrak team at [email or web address] for replacement.  If after hours and replacement cannot be delayed, place a NG tube and confirm placement with an abdominal x-ray.    [name] RD, LDN For contact information, refer to AMiON.   

## 2023-08-04 NOTE — Progress Notes (Signed)
SLP Cancellation Note  Patient Details Name: [name] MRN: [id number] DOB: 11-25-1980   Cancelled treatment:  Attempted to see x2; in HD, then required Ativan after agitated episode.  Will continue efforts to assess swallow.  Imagine [name], MA CCC/SLP Clinical Specialist - Acute Care SLP Acute Rehabilitation Services Office number [phone]          [name] 08/04/2023, 3:06 PM

## 2023-08-04 NOTE — TOC Initial Note (Signed)
Transition of Care [facility]) - Initial/Assessment Note    Patient Details  Name: [name] MRN: [id number] Date of Birth: [birth year]  Transition of Care [facility]) CM/SW Contact:    Harriet Masson, RN Phone Number: 08/04/2023, 12:21 PM  Clinical Narrative:                  Patient has Septic shock, still low grade fevers. Patient has left AKA, MRSA bacteremia and infectie endocarditis  Patient needs Value replacement but Unfortunately has been declined by [facility] and [facility] thoracic surgery intervention.   TOC will continue to follow for needs.     Barriers to Discharge: Continued Medical Work up   Patient Goals and CMS Choice            Expected Discharge Plan and Services       Living arrangements for the past 2 months: Single Family Home                                      Prior Living Arrangements/Services Living arrangements for the past 2 months: Single Family Home   Patient language and need for interpreter reviewed:: Yes        Need for Family Participation in Patient Care: Yes (Comment) Care giver support system in place?: Yes (comment)   Criminal Activity/Legal Involvement Pertinent to Current Situation/Hospitalization: No - Comment as needed  Activities of Daily Living   ADL Screening (condition at time of admission) Independently performs ADLs?: No Does the patient have a NEW difficulty with bathing/dressing/toileting/self-feeding that is expected to last >3 days?: Yes (Initiates electronic notice to provider for possible OT consult) Does the patient have a NEW difficulty with getting in/out of bed, walking, or climbing stairs that is expected to last >3 days?: Yes (Initiates electronic notice to provider for possible PT consult) Does the patient have a NEW difficulty with communication that is expected to last >3 days?: Yes (Initiates electronic notice to provider for possible SLP consult) Is the patient deaf or have difficulty hearing?:  No Does the patient have difficulty seeing, even when wearing glasses/contacts?: No Does the patient have difficulty concentrating, remembering, or making decisions?: No  Permission Sought/Granted                  Emotional Assessment              Admission diagnosis:  DKA (diabetic ketoacidosis) ([name]) [E11.10] Altered mental status, unspecified altered mental status type [R41.82] Diabetic ketoacidosis without coma associated with type 1 diabetes mellitus ([name]) [E10.10] Patient Active Problem List   Diagnosis Date Noted   Cerebrovascular accident (CVA) due to embolism of cerebral artery ([name]) 08/02/2023   Endocarditis of tricuspid valve 07/27/2023   Cavitary pneumonia 07/27/2023   Perforation of leaflet of mitral valve 07/27/2023   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) 07/27/2023   Hemiparesis affecting left side as late effect of stroke ([name]) 07/27/2023   Dysphagia due to recent stroke 07/27/2023   Cardiogenic shock ([name]) 07/27/2023   Acute blood loss anemia 07/27/2023   Hx of AKA (above knee amputation), left ([name]) 07/27/2023   Subclavian vein occlusion, bilateral ([name]) 07/27/2023   Fatty liver 07/27/2023   Liver mass 07/27/2023   Physical deconditioning 07/27/2023   Protein-calorie malnutrition, severe 07/22/2023   Infective endocarditis of tricuspid valve 07/15/2023   Acute systolic CHF (congestive heart failure) ([name]) 07/14/2023  DCM (dilated cardiomyopathy) ([name]) 07/14/2023   Coronary artery calcification seen on CAT scan 07/14/2023   Endocarditis of mitral valve 07/13/2023   ESRD on hemodialysis ([name]) 07/13/2023   MRSA bacteremia 07/12/2023   Right elbow pain 07/12/2023   Acute metabolic encephalopathy 07/10/2023   Diabetic foot infection ([name]) 04/09/2023   Elevated LFTs 04/09/2023   Thrombocytopenia ([name]) 04/09/2023   Cellulitis of right leg 05/19/2021   Pressure injury of skin 10/15/2020   Adjustment disorder, unspecified  09/21/2020   Malnutrition of moderate degree 09/13/2020   Charcot's joint of foot, left    Pneumonia due to COVID-19 virus 02/14/2020   Allergy, unspecified, initial encounter 07/17/2019   Diarrhea 04/24/2019   Encounter for orthopedic aftercare following surgical amputation    DM (diabetes mellitus), secondary, uncontrolled, with neurologic complications    Headache 09/23/2018   Anemia of chronic disease 08/18/2018   Unspecified protein-calorie malnutrition ([name]) 02/14/2018   Insulin dependent type 1 diabetes mellitus ([name]) 10/25/2017   Diabetic gastroparesis ([name]) 10/25/2017   Septic shock ([name]) 10/25/2017   Iron deficiency anemia, unspecified 10/28/2016   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   Secondary hyperparathyroidism of renal origin ([name]) 10/28/2016   HTN (hypertension) 08/09/2014   ESRD on dialysis ([name]) 03/19/2014   PCP:  [name], NP Pharmacy:   [id number] - [place], [name] - [address] [name] [address] Phone: [phone] Fax: [phone]     Social Determinants of Health (SDOH) Social History: SDOH Screenings   Food Insecurity: No Food Insecurity (08/03/2023)  Housing: Low Risk  (08/03/2023)  Transportation Needs: No Transportation Needs (08/03/2023)  Utilities: Not At Risk (08/03/2023)  Alcohol Screen: Low Risk  (02/15/2023)  Depression (PHQ2-9): Low Risk  (02/15/2023)  Financial Resource Strain: Low Risk  (02/15/2023)  Physical Activity: Inactive (02/15/2023)  Social Connections: Socially Integrated (02/15/2023)  Stress: No Stress Concern Present (02/15/2023)  Tobacco Use: Low Risk  (08/03/2023)   SDOH Interventions:     Readmission Risk Interventions     No data to display

## 2023-08-04 NOTE — Progress Notes (Signed)
Patient with hypoglycemia, glucose 50. Tube feeds were temporarily suspended today while the patient's Cortrak was clogged. We did end up removing Cortrak and placing an NG tube. At this time awaiting radiology read out to verify correct placement before use.  Tube feedings were restarted at this time. MD paged - see orders. Plan to recheck blood sugar in 15 minutes.

## 2023-08-04 NOTE — Progress Notes (Signed)
PHARMACY - ANTICOAGULATION CONSULT NOTE  Pharmacy Consult for heparin Indication: DVT  No Known Allergies  Patient Measurements: Height: 5\' 7"  (170.2 cm) Weight: 81.5 kg (179 lb 10.8 oz) IBW/kg (Calculated) : 66.1 Heparin Dosing Weight: 75 kg  Vital Signs: Temp: 99.5 F (37.5 C) (11/06 2203) Temp Source: Axillary (11/06 2203) BP: 87/53 (11/06 2203) Pulse Rate: 117 (11/06 2203)  Labs: Recent Labs    08/02/23 0410 08/02/23 2315 08/03/23 0324 08/03/23 1042 08/04/23 0406 08/04/23 1310 08/04/23 2145  HGB 7.0*  --  6.8*  --  8.1*  --   --   HCT 23.5*  --  22.1*  --  26.8*  --   --   PLT 312  --  324  --  362  --   --   HEPARINUNFRC  --    < >  --    < > 0.19* 0.34 0.13*  CREATININE 6.02*  --  3.82*  --  5.23*  --   --    < > = values in this interval not displayed.    Estimated Creatinine Clearance: 18.6 mL/min (A) (by C-G formula based on SCr of 5.23 mg/dL (H)).   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  Has required multiple blood transfusions (5 units total, last 11/5 for Hgb 6.8).  Pharmacy consulted to dose heparin.  No anticoagulation PTA.    Heparin level down to subtherapeutic (0.13) on infusion at 2000 units/hr. No issues with line or bleeding reported per RN.  Goal of Therapy:  Heparin level 0.3-0.7 units/ml Monitor platelets by anticoagulation protocol: Yes   Plan:  Increase heparin to 2150 units/hour  Will f/u 8 hr heparin level   Christoper Fabian, PharmD, BCPS Please see amion for complete clinical pharmacist phone list 08/04/2023 11:02 PM

## 2023-08-04 NOTE — Progress Notes (Signed)
PHARMACY - ANTICOAGULATION Pharmacy Consult for heparin Indication: DVT Brief A/P: Heparin level subtherapeutic Increase Heparin rate  No Known Allergies  Patient Measurements: Height: 5\' 7"  (170.2 cm) Weight: 74.9 kg (165 lb 2 oz) IBW/kg (Calculated) : 66.1 Heparin Dosing Weight: 75 kg  Vital Signs: Temp: 98.6 F (37 C) (11/06 0231) Temp Source: Oral (11/06 0231) BP: 115/72 (11/06 0231) Pulse Rate: 110 (11/06 0231)  Labs: Recent Labs    08/02/23 0410 08/02/23 2315 08/03/23 0324 08/03/23 1042 08/03/23 2154 08/04/23 0406  HGB 7.0*  --  6.8*  --   --  8.1*  HCT 23.5*  --  22.1*  --   --  26.8*  PLT 312  --  324  --   --  362  HEPARINUNFRC  --    < >  --  0.11* 0.16* 0.19*  CREATININE 6.02*  --  3.82*  --   --  5.23*   < > = values in this interval not displayed.    Estimated Creatinine Clearance: 17 mL/min (A) (by C-G formula based on SCr of 5.23 mg/dL (H)).  Assessment: 42 y.o. male with DVT for heparin  Goal of Therapy:  Heparin level 0.3-0.7 units/ml Monitor platelets by anticoagulation protocol: Yes   Plan:  Increase Heparin 2000 units/hr Check heparin level in 8 hours.  Geannie Risen, PharmD, BCPS

## 2023-08-04 NOTE — Procedures (Signed)
Patient seen on Hemodialysis. BP (!) 143/74   Pulse (!) 124   Temp (!) 97.3 F (36.3 C)   Resp 15   Ht 5\' 7"  (1.702 m)   Wt 83.5 kg Comment: Taken via bed.  SpO2 99%   BMI 28.83 kg/m   QB 400, UF goal 2.1L Tolerating treatment without complaints at this time.   Zetta Bills MD [facility]. Office # [phone] Pager # [phone] 10:07 AM

## 2023-08-04 NOTE — Progress Notes (Signed)
Nutrition Follow-up  DOCUMENTATION CODES:   Severe malnutrition in context of chronic illness  INTERVENTION:   Continue tube feeding via Cortrak: - Nepro @ 55 ml/hr (1320 ml/day) - PROSource TF20 60 ml daily  Tube feeding regimen provides 2416 kcal, 127 grams of protein, and 960 ml of H2O.  - Continue renal MVI daily per tube  - Continue 1 packet Juven BID per tube to support wound healing, each packet provides 95 calories, 2.5 grams of protein, and 9.8 grams of carbohydrate  - Continue Banatrol TF 60 ml TID  NUTRITION DIAGNOSIS:   Severe Malnutrition related to chronic illness as evidenced by severe fat depletion, severe muscle depletion.  Ongoing, being addressed via nutrition support  GOAL:   Patient will meet greater than or equal to 90% of their needs  Met via nutrition support  MONITOR:   Labs, I & O's, Weight trends, Diet advancement, TF tolerance, Skin  REASON FOR ASSESSMENT:   Ventilator    ASSESSMENT:   42 y/o male with h/o CHF, ESRD on HD, DM, R AKA and gastroparesis who is admitted with DKA, AMS, septic shock, MRSA bacteremia, endocarditis of mitral valve, cavitary pneumonia and combined systolic/diastolic heart failure with cardiogenic shock complicatedby CVA noted on 10/17, thrombocytopenia and liver mass.  10/22 - CRRT start, TEE confirming large MV vegetation with AML perforation 10/24 - extubated 10/25 - Cortrak placed (tip near pylorus) 10/30 - CRRT off 11/03 - TF changed to Nepro 11/05 - Cortrak clogged and removed, replaced with NG tube 11/06 - pt removed NG tube, Cortrak placed (x-ray pending)  Pt remains NPO. Last iHD was this morning with 2000 ml net UF. Post-HD weight was 81.5 kg. Current weight is up compared to admission weight of 77.6 kg.  Pt's Cortrak tube became clogged yesterday. It was removed and replaced with an NG tube which pt removed during HD this morning. Cortrak placed for enteral access; x-ray pending. Pt agitated and  spitting during placement.  Current TF: Nepro @ 55 ml/hr, PROSource TF20 60 ml daily  Medications reviewed and include: calcitriol every M-W-F, aranesp weekly, banatrol TF 60 ml TID, SSI every 4 hours, semglee 5 units daily, rena-vit BID, Juven BID, heparin gtt, IV abx  Labs reviewed: sodium 132, chloride 92, BUN 107, creatinine 5.23, WBC 16.9, hemoglobin 8.1 CBG's: 50-208 x 24 hours  I/O's: +11.4 L since admit  Diet Order:   Diet Order             Diet NPO time specified  Diet effective now                   EDUCATION NEEDS:   Not appropriate for education at this time  Skin:  Skin Assessment: Skin Integrity Issues: Stage II: R buttocks Other: skin tear to sacrum, abrasion to L buttocks  Last BM:  08/04/23 rectal tube  Height:   Ht Readings from Last 1 Encounters:  07/19/23 5\' 7"  (1.702 m)    Weight:   Wt Readings from Last 1 Encounters:  08/04/23 81.5 kg    Ideal Body Weight:  61.9 kg (adjusted for L AKA)  BMI:  Body mass index is 28.14 kg/m.  Estimated Nutritional Needs:   Kcal:  2200-2500kcal/day  Protein:  120-135g/day  Fluid:  UOP +1L    Mertie Clause, MS, RD, LDN Registered Dietitian II Please see AMiON for contact information.

## 2023-08-05 DIAGNOSIS — R4182 Altered mental status, unspecified: Secondary | ICD-10-CM | POA: Diagnosis not present

## 2023-08-05 DIAGNOSIS — J189 Pneumonia, unspecified organism: Secondary | ICD-10-CM | POA: Diagnosis not present

## 2023-08-05 DIAGNOSIS — I059 Rheumatic mitral valve disease, unspecified: Secondary | ICD-10-CM | POA: Diagnosis not present

## 2023-08-05 DIAGNOSIS — I69391 Dysphagia following cerebral infarction: Secondary | ICD-10-CM | POA: Diagnosis not present

## 2023-08-05 LAB — CBC
HCT: 25.3 % — ABNORMAL LOW (ref 39.0–52.0)
Hemoglobin: 7.9 g/dL — ABNORMAL LOW (ref 13.0–17.0)
MCH: 30 pg (ref 26.0–34.0)
MCHC: 31.2 g/dL (ref 30.0–36.0)
MCV: 96.2 fL (ref 80.0–100.0)
Platelets: 331 10*3/uL (ref 150–400)
RBC: 2.63 MIL/uL — ABNORMAL LOW (ref 4.22–5.81)
RDW: 17.9 % — ABNORMAL HIGH (ref 11.5–15.5)
WBC: 14.2 10*3/uL — ABNORMAL HIGH (ref 4.0–10.5)
nRBC: 0 % (ref 0.0–0.2)

## 2023-08-05 LAB — BASIC METABOLIC PANEL
Anion gap: 15 (ref 5–15)
BUN: 45 mg/dL — ABNORMAL HIGH (ref 6–20)
CO2: 25 mmol/L (ref 22–32)
Calcium: 8.7 mg/dL — ABNORMAL LOW (ref 8.9–10.3)
Chloride: 90 mmol/L — ABNORMAL LOW (ref 98–111)
Creatinine, Ser: 3.71 mg/dL — ABNORMAL HIGH (ref 0.61–1.24)
GFR, Estimated: 20 mL/min — ABNORMAL LOW (ref >60)
Glucose, Bld: 234 mg/dL — ABNORMAL HIGH (ref 70–99)
Potassium: 3.6 mmol/L (ref 3.5–5.1)
Sodium: 130 mmol/L — ABNORMAL LOW (ref 135–145)

## 2023-08-05 LAB — GLUCOSE, CAPILLARY
Glucose-Capillary: 170 mg/dL — ABNORMAL HIGH (ref 70–99)
Glucose-Capillary: 198 mg/dL — ABNORMAL HIGH (ref 70–99)
Glucose-Capillary: 207 mg/dL — ABNORMAL HIGH (ref 70–99)
Glucose-Capillary: 274 mg/dL — ABNORMAL HIGH (ref 70–99)
Glucose-Capillary: 277 mg/dL — ABNORMAL HIGH (ref 70–99)
Glucose-Capillary: 90 mg/dL (ref 70–99)

## 2023-08-05 LAB — HEPARIN LEVEL (UNFRACTIONATED)
Heparin Unfractionated: 0.24 [IU]/mL — ABNORMAL LOW (ref 0.30–0.70)
Heparin Unfractionated: 0.35 [IU]/mL (ref 0.30–0.70)

## 2023-08-05 LAB — MAGNESIUM: Magnesium: 2.1 mg/dL (ref 1.7–2.4)

## 2023-08-05 LAB — PHOSPHORUS: Phosphorus: 4.2 mg/dL (ref 2.5–4.6)

## 2023-08-05 NOTE — Progress Notes (Signed)
PHARMACY - ANTICOAGULATION CONSULT NOTE  Pharmacy Consult for heparin Indication: DVT  No Known Allergies  Patient Measurements: Height: 5\' 7"  (170.2 cm) Weight: 79.6 kg (175 lb 7.8 oz) IBW/kg (Calculated) : 66.1 Heparin Dosing Weight: 75 kg  Vital Signs: Temp: 98.7 F (37.1 C) (11/07 1102) Temp Source: Oral (11/07 1102) BP: 105/81 (11/07 1615) Pulse Rate: 115 (11/07 1615)  Labs: Recent Labs    08/03/23 0324 08/03/23 1042 08/04/23 0406 08/04/23 1310 08/04/23 2145 08/05/23 0508 08/05/23 0731 08/05/23 1701  HGB 6.8*  --  8.1*  --   --  7.9*  --   --   HCT 22.1*  --  26.8*  --   --  25.3*  --   --   PLT 324  --  362  --   --  331  --   --   HEPARINUNFRC  --    < > 0.19*   < > 0.13*  --  0.24* 0.35  CREATININE 3.82*  --  5.23*  --   --  3.71*  --   --    < > = values in this interval not displayed.    Estimated Creatinine Clearance: 26 mL/min (A) (by C-G formula based on SCr of 3.71 mg/dL (H)).   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  Has required multiple blood transfusions (5 units total, last 11/5 for Hgb 6.8).  Patient is on HD. Pharmacy consulted to dose heparin.  No anticoagulation PTA.    Heparin level 0.35 is at low end of therapeutic on 2350 units/hr.  No issues with infusion or bleeding per RN. Hgb stable. Increase to target closer to 0.5 for DVT.    Goal of Therapy:  Heparin level 0.3-0.7 units/ml Monitor platelets by anticoagulation protocol: Yes   Plan:  Increase heparin to 2400 units/hour  Monitor daily heparin level, CBC, signs/symptoms of bleeding    Alphia Moh, PharmD, BCPS, [facility] Clinical Pharmacist  Please check AMION for all [facility] Pharmacy phone numbers After 10:00 PM, call Main Pharmacy [phone]

## 2023-08-05 NOTE — Progress Notes (Signed)
Speech Language Pathology Treatment: Dysphagia  Patient Details Name: [name] MRN: [id number] DOB: 1981/05/30 Today's Date: 08/05/2023 Time: 4098-1191 SLP Time Calculation (min) (ACUTE ONLY): 15 min  Assessment / Plan / Recommendation Clinical Impression  [name] was sleeping but rousable this morning. Demonstrates ongoing, fluctuating confusion. Today he accepted PO trials of ice chips, sips of water, sips of honey-thick liquids and bites of applesauce.  He demonstrated intermittent oral holding, requiring tactile/verbal cues to initiate a swallow. He continues to present with ongoing, intermittent and delayed coughing regardless of consistency provided. There has been slow progress toward resuming POs. Swallow physiology needs to be evaluated.  Given poor success with FEES last week, he would benefit from [facility]. If mentation/behavior allows, will attempt complete study in the next 24-48 hours.  D/W RN.      HPI HPI: Pt is a 42 yo male presenting 10/11 with AMS. His dialysis catheter was partially removed and he was in DKA. Admitted for septic shock from MRSA bacteremia; infective endocarditis. LUE weakness was noted 10/17 and MRI confirmed sizable infarct in the R MCA branch as well as small acute infarcts in the L occipital lobe and bilateral cerebellum. Pt initially evaluated by SLP 10/18 and recommended to have Dys 3 diet and thin liquids due primarily oral symptoms. Pt had worsening hypoxia and AMS after vomiting on 10/21 requiring intubation 10/21-10/24. 10/22-10/30 CRRT. PMH includes: ESRD, DM, gastroparesis, L AKA, HTN, recent admission in July for sepsis secondary to R diabetic foot ulcer (left AMA)      SLP Plan  Continue with current plan of care      Recommendations for follow up therapy are one component of a multi-disciplinary discharge planning process, led by the attending physician.  Recommendations may be updated based on patient status, additional functional criteria and  insurance authorization.    Recommendations  Diet recommendations: NPO Medication Administration: Via alternative means                  Oral care QID   Frequent or constant Supervision/Assistance Dysphagia, oropharyngeal phase (R13.12)     Continue with current plan of care   Shaconda Hajduk L. Samson Frederic, MA CCC/SLP Clinical Specialist - Acute Care SLP Acute Rehabilitation Services Office number [phone]   [name]  08/05/2023, 9:54 AM

## 2023-08-05 NOTE — Progress Notes (Signed)
Patient ID: [name], male   DOB: [birth year], 42 y.o.   MRN: [id number] [facility] Progress Note   Assessment/ Plan:   1.  MRSA bacteremia with mitral valve endocarditis: Suspected to be related to catheter related bacteremia and is status post line holiday with new left femoral TDC placed by IR on 10/18 (has occlusion of bilateral subclavian veins limiting his options for dialysis access).  On intravenous vancomycin. 2.  Acute ischemic CVA: Secondary to septic infarct on 10/17, appears to have had some neurological impact and likely to affect disposition at discharge. 3. ESRD: Usually on a Monday/Wednesday/Friday schedule and underwent hemodialysis yesterday without problems up in dialysis unit.  Plan for next hemodialysis treatment again tomorrow. 4. Anemia: Hemoglobin/hematocrit trending down, will restart ESA. 5. CKD-MBD: Calcium and phosphorus levels currently acceptable level, monitor on renal diet.  On calcitriol for PTH control. 6. Nutrition: Continue tube feeds via NG tube as mental status improves and allows for reevaluation of swallow. 7. Hypotension: Weaned off of pressors, blood pressures currently within acceptable range.  Subjective:   Comfortably resting in bed, awakens to conversation with some verbal responses.   Objective:   BP 101/67   Pulse (!) 115   Temp 98.6 F (37 C) (Axillary)   Resp 17   Ht 5\' 7"  (1.702 m)   Wt 79.6 kg   SpO2 98%   BMI 27.49 kg/m   Physical Exam: Gen: Awakens to conversation and appears comfortable resting in bed.  NG tube in place CVS: Regular tachycardia, S1 and S2 Resp: Transmitted breath sounds, no distinct rales or rhonchi.  Oxygen via nasal cannula Abd: Soft, flat, nontender, bowel sounds normal Ext: Status post left AKA, no edema right leg.  Left femoral TDC in place  Labs: BMET Recent Labs  Lab 07/30/23 0406 07/31/23 0358 08/01/23 0331 08/02/23 0410 08/03/23 0324 08/04/23 0406 08/05/23 0508  NA 138  133* 133* 134* 133* 132* 130*  K 5.4* 4.9 5.5* 5.5* 4.3 4.5 3.6  CL 95* 94* 92* 90* 93* 92* 90*  CO2 30 26 28 29 28 25 25   GLUCOSE 185* 314* 144* 234* 201* 158* 234*  BUN 81* 45* 80* 108* 62* 107* 45*  CREATININE 4.78* 3.14* 4.54* 6.02* 3.82* 5.23* 3.71*  CALCIUM 9.1 8.3* 8.8* 9.2 9.0 9.2 8.7*  PHOS 4.4  --  5.5* 6.0* 3.5 4.6 4.2   CBC Recent Labs  Lab 08/02/23 0410 08/03/23 0324 08/04/23 0406 08/05/23 0508  WBC 17.9* 17.5* 16.9* 14.2*  HGB 7.0* 6.8* 8.1* 7.9*  HCT 23.5* 22.1* 26.8* 25.3*  MCV 100.0 97.8 94.7 96.2  PLT 312 324 362 331    Medications:     calcitRIOL  1.5 mcg Per Tube Q M,W,F   Chlorhexidine Gluconate Cloth  6 each Topical Q0600   darbepoetin (ARANESP) injection - DIALYSIS  40 mcg Subcutaneous Q Fri-1800   feeding supplement (PROSource TF20)  60 mL Per Tube Daily   fiber supplement (BANATROL TF)  60 mL Per Tube BID   gabapentin  300 mg Per Tube QHS   insulin aspart  0-9 Units Subcutaneous Q4H   insulin glargine-yfgn  5 Units Subcutaneous Daily   lidocaine  1 Application Topical Once   midodrine  20 mg Per Tube Q8H   multivitamin  1 tablet Per Tube QHS   nutrition supplement (JUVEN)  1 packet Per Tube BID BM   mouth rinse  15 mL Mouth Rinse 4 times per day   QUEtiapine  50 mg  Per Tube QHS   QUEtiapine  50 mg Per Tube [id number]   Zetta Bills, MD 08/05/2023, 8:15 AM

## 2023-08-05 NOTE — Plan of Care (Signed)

## 2023-08-05 NOTE — Plan of Care (Signed)
  Problem: Education: Goal: Knowledge of General Education information will improve Description: Including pain rating scale, medication(s)/side effects and non-pharmacologic comfort measures Outcome: Not Progressing   Problem: Health Behavior/Discharge Planning: Goal: Ability to manage health-related needs will improve Outcome: Not Progressing   Problem: Clinical Measurements: Goal: Ability to maintain clinical measurements within normal limits will improve Outcome: Progressing Goal: Will remain free from infection Outcome: Progressing Goal: Diagnostic test results will improve Outcome: Progressing Goal: Respiratory complications will improve Outcome: Progressing Goal: Cardiovascular complication will be avoided Outcome: Progressing   Problem: Activity: Goal: Risk for activity intolerance will decrease Outcome: Progressing   Problem: Nutrition: Goal: Adequate nutrition will be maintained Outcome: Progressing   Problem: Coping: Goal: Level of anxiety will decrease Outcome: Progressing   Problem: Elimination: Goal: Will not experience complications related to bowel motility Outcome: Progressing Goal: Will not experience complications related to urinary retention Outcome: Progressing   Problem: Pain Management: Goal: General experience of comfort will improve Outcome: Progressing   Problem: Safety: Goal: Ability to remain free from injury will improve Outcome: Progressing   Problem: Skin Integrity: Goal: Risk for impaired skin integrity will decrease Outcome: Progressing   Problem: Self-Care: Goal: Ability to participate in self-care as condition permits will improve Outcome: Progressing Goal: Verbalization of feelings and concerns over difficulty with self-care will improve Outcome: Not Progressing Goal: Ability to communicate needs accurately will improve Outcome: Progressing   Problem: Nutrition: Goal: Risk of aspiration will decrease Outcome:  Progressing Goal: Dietary intake will improve Outcome: Progressing   Problem: Education: Goal: Knowledge of disease or condition will improve Outcome: Not Progressing Goal: Knowledge of secondary prevention will improve (MUST DOCUMENT ALL) Outcome: Not Progressing Goal: Knowledge of patient specific risk factors will improve Loraine Leriche N/A or DELETE if not current risk factor) Outcome: Not Progressing   Problem: Ischemic Stroke/TIA Tissue Perfusion: Goal: Complications of ischemic stroke/TIA will be minimized Outcome: Progressing   Problem: Coping: Goal: Will verbalize positive feelings about self Outcome: Progressing Goal: Will identify appropriate support needs Outcome: Progressing   Problem: Health Behavior/Discharge Planning: Goal: Ability to manage health-related needs will improve Outcome: Progressing Goal: Goals will be collaboratively established with patient/family Outcome: Progressing   Problem: Self-Care: Goal: Ability to participate in self-care as condition permits will improve Outcome: Progressing Goal: Verbalization of feelings and concerns over difficulty with self-care will improve Outcome: Progressing Goal: Ability to communicate needs accurately will improve Outcome: Progressing   Problem: Nutrition: Goal: Risk of aspiration will decrease Outcome: Progressing Goal: Dietary intake will improve Outcome: Progressing   Problem: Safety: Goal: Non-violent Restraint(s) Outcome: Progressing

## 2023-08-05 NOTE — Progress Notes (Signed)
Pharmacy Antibiotic Note  [name] is a 42 y.o. 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 Pharmacy has been consulted for Vancomycin dosing.  Per chart notes, next scheduled HD will be Friday 11/8.   Plan: - Schedule vancomycin 750 mg IV q HD - Will monitor HD tolerance and dosing - Follow-up duration of treatment   Height: 5\' 7"  (170.2 cm) Weight: 79.6 kg (175 lb 7.8 oz) IBW/kg (Calculated) : 66.1  Temp (24hrs), Avg:99.3 F (37.4 C), Min:98.4 F (36.9 C), Max:100.9 F (38.3 C)  Recent Labs  Lab 07/30/23 1004 07/31/23 0358 08/01/23 0331 08/02/23 0410 08/03/23 0324 08/04/23 0406 08/05/23 0508  WBC  --    < > 20.0* 17.9* 17.5* 16.9* 14.2*  CREATININE  --    < > 4.54* 6.02* 3.82* 5.23* 3.71*  VANCORANDOM 24  --   --   --   --   --   --    < > = values in this interval not displayed.    Estimated Creatinine Clearance: 26 mL/min (A) (by C-G formula based on SCr of 3.71 mg/dL (H)).    No Known Allergies  Antimicrobials this admission: Vancomycin 10/12 >> Cefepime 10/21 >> 10/23; restart 10/25 >> 10/29  Microbiology results: 10/12 MRSA PCR >> positive 10/12 CDiff antigen +, toxin -, PCR - >> negative 10/12 BCx >> 4/4 MRSA 10/13 BCx >> NGf 10/17 BCx >> NGf 10/21 RCx >> NOF  Thank you for allowing pharmacy to be a part of this patient's care.  Lennie Muckle, PharmD PGY1 Pharmacy Resident 08/05/2023 8:30 AM

## 2023-08-05 NOTE — Progress Notes (Signed)
[facility]  PROGRESS NOTE  [name] JYN:[id number] DOB: Dec 16, 1980 DOA: 07/09/2023 PCP: [name], NP   Brief HPI:    42 yo M w/ pertinent PMH ESRD on HD MWF, IDDM, gastroparesis, left AKA, HTN presents to [facility] on 10/11 w/ AMS.   Patient recently admitted to [facility] on 7/12 sepsis from right diabetic foot ulcer. Cultures w/ MRSA. Patient had dialysis catheter removed and replaced. ID treated w/ vanc. Patient refused echo and left on 7/17 against medical advise.    On 10/11 patient was found by family member on ground altered. EMS arrived and dialysis catheter partially removed. Patient more alert for them. Has been complaining of diarrhea x1 week per family. Unsure if patient went to dialysis today. Transferred to [facility] ED. On arrival bp stable 107/70 and afebrile. Patient remains confused and occasionally with agitation. Given dilaudid. CBG 497. Beta H >8. VBG ph 7.06, 16, 54, 4.7. Given iv fluids and started on insulin drip for DKA. CT head negative for acute abnormality. Cxr no significant findings. CT abd/pelvis no acute findings; possible steatosis vs. Liver mass 7 x 4 cm recommending f/u w/ MRI. WBC 20.7 and LA 2.4. Sepsis protocol initiated. Cultures obtained and started on vanc/zosyn. Given ams, dka, and soft bp pccm consulted for icu admission.   10/21 pccm called overnight to assess for worsening hypoxia and AMS after vomiting. On NRB w/ sats 70-80s. Hypoglycemic given dextrose which improved his mental status some. Transferred to 2H icu for intubation.   Patient was extubated, transferred to progressive care unit under [facility] service on 08/04/2023  10/12: altered mental status, DKA 10/12: blood cultures positive for MRSA 10/13 TTE LVEF 25-30% w/ severely depressed LVF, global hypokinesis, gd I diastolic dysfxn, mod RV fxn reduction. Possible filamentous structure on MV 10/16 TEE LVEF 30-35%RV fxn nml. Large mass (2.00 cm x 0.88 cm ) on the posterior mitral leaflet which  appeared to be extending in the mitral annulus. There are two mobile masses visible on the anterior leaflet of the tricupid valve which also appears to have a perforation. Another mobile  mass ( 0.68 cm x 0.67 cm) on the posterior tricupid leaflet. Moderate Tricuspid regurgitation which appears to  be through the leaflet perforation  10/16 seen by Cardiothoracic surg. Felt poor candidate. Recommended 6 weeks abx and recommended repeat TEE 1 week 10/17 noting left arm weakness. Not clear when started.  MRI showed Right MCA infarct w/ acute infarcts in left occipital and bilateral cerebellum felt c/w septic emboli. Neuro consulted.  10/18 tunneled left femoral vasc cath placed after catheter holiday  10/20 palliative care consulted later that day worsening hypoxia. Sats 70s. After vomiting. More lethargic  10/21: resp failure requiring intubation shortly after midnight  10/22- TEE 10/22 with EF 35%, LV global hypokinesis. Very large vegetation lateral scallop of posterior mitral leaflet 3.9cm length 1 cm width, highly mobile, prolapsing into LV. No large vegetation seen on TV.  Later that afternoon and abrupt hemodynamic changes requiring escalation of vasopressor support.  Felt given the size of his vegetation and increased bulk of the vegetation infections unlikely to clear.  He was felt to be decompensation may have due to of breath leaflet perforation and acute mitral valve regurgb 10/23: per nursing less responsive than yesterday, increased ETT secretions. Clotting off CRRT. Ct head showing expected evolution of the right MCA stroke.  Cardiothoracic surgery re-consulted for worsening MVE. Felt too high risk here. Recommended referral to tertiary care  10/24 agitated when  sedation decreased, is purposeful but does not follow commands. Tolerating UF gaols of -150. Still on NE infusion but extubated later that afternoon after passing SBT 10/25 failed swallow eval still on just 3 lpm O2. Encephalopathic.  Added seroquel in effort to get him off the precedex. Cardiology told no beds at [facility]. Not clear if accepted or not. Worsening pressor requirements. Got blood for hgb 6. Cefepime resumed for clinical decline and leukocytosis  10/26 Hemoglobin again dropped to 6.3 overnight with increased pressor requirement yesterday afternoon now on vaso and levo. 10/27 Hypoglycemic episode overnight, mental status improved a little over weekend  10/28 Getting another unit of blood more awake.  Oriented x 2.  Still on Precedex.  Having liquid stools. 10/30 Off Precedex, low-dose levo.  Plan to transition to IHD 10/29 Cefepime for aspiration course completed.  10/31 Seen by overnight team for hypoxia and somnolence ABG with mild hypercapnia. Again declined by [facility] because they were at capacity. More awake. Doing better w/ Bed side swallow but still likely aspirating. CRRT stopped.  11/1 Adjustments made for better glycemic control.  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 - Low dose metoprolol held in the setting of hypotension, consider slow resumption -  Definitive management is valve repair/replacement - Cardiology/TCTS consulted, recommend transfer to tertiary care center due to high-risk patient/procedure - [facility] has denied transfer x 2 (?capacity), [facility] denied for multiple prohibitive surgical risks -Again called [facility] for transfer on 08/05/2023, declined at this time due to capacity.   Ongoing sinus tachycardia, ?r/t LUE DVT Heart rate remains 130s to 140s. - Cardiac monitoring - Optimize electrolytes for K > 4, Mg > 2 - Consider resumption of low-dose metoprolol, given ongoing tachycardic and improved BP   LUE DVT Dopplers 11/4 positive for L IJ, L [place], L axillary veins; septic versus VTE. 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 - Delirium precautions - Klonopin PRN - Continue Seroquel - PT/OT/SLP as able to participate in care   End-stage renal disease on hemodialysis (MWF at baseline) Has occluded bilateral [place] veins  Mild hyperkalemia off CRRT - Nephrology following, appreciate assistance - Successfully transitioned to [facility], next 11/6 - Trend BMP - Replete electrolytes as indicated - Monitor I&Os - Avoid nephrotoxic agents as able - Ensure adequate renal perfusion   Dysphagia - TF via Cortrak - Failed SLP/swallow eval multiple times   T2DM w/ hyperglycemia and hypoglycemia, brittle - Basal Semglee 5U daily - SSI - CBGs  Q4H - Goal CBG 140-180   Anemia and thrombocytopenia critical illness Shane/p 5 units total PRBC. - Trend H&H - Monitor for signs of active bleeding - Transfuse for Hgb < 7.0 or hemodynamically significant bleeding - 1U PRBCs given 11/5AM for Hgb 6.8.   Incidental finding of Liver steatosis vs. liver mass CT A/P showing 7 x 4 cm density in anterior  liver. - Outpatient surveillance/GI follow up    Medications     calcitRIOL  1.5 mcg Per Tube Q M,W,F   Chlorhexidine Gluconate Cloth  6 each Topical Q0600   darbepoetin (ARANESP) injection - DIALYSIS  40 mcg Subcutaneous Q Fri-1800   feeding supplement (PROSource TF20)  60 mL Per Tube Daily   fiber supplement (BANATROL TF)  60 mL Per Tube BID   gabapentin  300 mg Per Tube QHS   insulin aspart  0-9 Units Subcutaneous Q4H   insulin glargine-yfgn  5 Units Subcutaneous Daily   lidocaine  1 Application Topical Once   midodrine  20 mg Per Tube Q8H   multivitamin  1 tablet Per Tube QHS   nutrition supplement (JUVEN)  1 packet Per Tube BID BM   mouth rinse  15 mL Mouth Rinse 4 times per day   QUEtiapine  50 mg Per Tube QHS   QUEtiapine  50 mg Per Tube Q0600     Data Reviewed:   CBG:  Recent Labs  Lab 08/04/23 1639 08/04/23 1942 08/05/23 0000 08/05/23 0454 08/05/23 0732  GLUCAP 197* 88 90 198* 274*    SpO2: 98 % O2 Flow Rate (L/min): 2 L/min FiO2 (%): 32 %    Vitals:   08/05/23 0605 08/05/23 0735 08/05/23 0736 08/05/23 0800  BP:   122/77 101/67  Pulse:  (!) 115 (!) 115 (!) 115  Resp:  17 17 17   Temp:  98.6 F (37 C)    TempSrc:  Axillary    SpO2:  94% 97% 98%  Weight: 79.6 kg     Height:          Data Reviewed:  Basic Metabolic Panel: Recent Labs  Lab 08/01/23 0331 08/02/23 0410 08/03/23 0324 08/04/23 0406 08/05/23 0508  NA 133* 134* 133* 132* 130*  K 5.5* 5.5* 4.3 4.5 3.6  CL 92* 90* 93* 92* 90*  CO2 28 29 28 25 25   GLUCOSE 144* 234* 201* 158* 234*  BUN 80* 108* 62* 107* 45*  CREATININE 4.54* 6.02* 3.82* 5.23* 3.71*  CALCIUM 8.8* 9.2 9.0 9.2 8.7*  MG 2.4 2.7* 2.0 2.3 2.1  PHOS 5.5* 6.0* 3.5 4.6 4.2    CBC: Recent Labs  Lab 08/01/23 0331 08/02/23 0410 08/03/23 0324 08/04/23 0406 08/05/23 0508  WBC 20.0* 17.9* 17.5* 16.9* 14.2*  HGB 7.6* 7.0* 6.8* 8.1* 7.9*  HCT 25.2* 23.5* 22.1* 26.8* 25.3*  MCV 99.2 100.0 97.8 94.7 96.2  PLT 304  312 324 362 331    LFT No results for input(Shane): "AST", "ALT", "ALKPHOS", "BILITOT", "PROT", "ALBUMIN" in the last 168 hours.   Antibiotics: Anti-infectives (From admission, onward)    Start     Dose/Rate Route Frequency Ordered Stop   08/02/23 1200  vancomycin (VANCOREADY) IVPB 750 mg/150 mL        750 mg 150 mL/hr over 60 Minutes Intravenous Every M-W-F (Hemodialysis) 07/31/23 1404     07/30/23 1800  vancomycin (VANCOREADY) IVPB 750 mg/150 mL        750 mg 150 mL/hr over 60 Minutes Intravenous Every M-W-F (Hemodialysis) 07/30/23 1358 07/30/23  1923   07/29/23 0812  vancomycin variable dose per unstable renal function (pharmacist dosing)  Status:  Discontinued         Does not apply See admin instructions 07/29/23 0813 07/31/23 1404   07/28/23 1000  vancomycin (VANCOREADY) IVPB 750 mg/150 mL  Status:  Discontinued        750 mg 150 mL/hr over 60 Minutes Intravenous Every 24 hours 07/27/23 1539 07/29/23 0813   07/27/23 1630  vancomycin (VANCOREADY) IVPB 500 mg/100 mL        500 mg 100 mL/hr over 60 Minutes Intravenous  Once 07/27/23 1539 07/27/23 1759   07/23/23 1945  ceFEPIme (MAXIPIME) 2 g in sodium chloride 0.9 % 100 mL IVPB  Status:  Discontinued        2 g 200 mL/hr over 30 Minutes Intravenous Every 12 hours 07/23/23 1856 07/27/23 1053   07/21/23 1200  vancomycin (VANCOREADY) IVPB 750 mg/150 mL  Status:  Discontinued        750 mg 150 mL/hr over 60 Minutes Intravenous Every M-W-F (Hemodialysis) 07/19/23 1106 07/20/23 0842   07/21/23 0230  vancomycin (VANCOREADY) IVPB 750 mg/150 mL  Status:  Discontinued        750 mg 150 mL/hr over 60 Minutes Intravenous Every 24 hours 07/20/23 1451 07/27/23 1314   07/20/23 2200  ceFEPIme (MAXIPIME) 2 g in sodium chloride 0.9 % 100 mL IVPB  Status:  Discontinued        2 g 200 mL/hr over 30 Minutes Intravenous Every 12 hours 07/20/23 1451 07/21/23 1108   07/19/23 2200  ceFEPIme (MAXIPIME) 1 g in sodium chloride 0.9 % 100 mL IVPB  Status:   Discontinued        1 g 200 mL/hr over 30 Minutes Intravenous Every 24 hours 07/19/23 1409 07/20/23 1451   07/19/23 2000  ceFEPIme (MAXIPIME) 2 g in sodium chloride 0.9 % 100 mL IVPB  Status:  Discontinued        2 g 200 mL/hr over 30 Minutes Intravenous Every M-W-F 07/19/23 0110 07/19/23 1409   07/19/23 0200  ceFEPIme (MAXIPIME) 2 g in sodium chloride 0.9 % 100 mL IVPB        2 g 200 mL/hr over 30 Minutes Intravenous  Once 07/19/23 0110 07/19/23 0221   07/18/23 1015  vancomycin (VANCOREADY) IVPB 750 mg/150 mL        750 mg 150 mL/hr over 60 Minutes Intravenous  Once 07/18/23 0929 07/19/23 1137   07/16/23 0000  ceFAZolin (ANCEF) IVPB 2g/100 mL premix        2 g 200 mL/hr over 30 Minutes Intravenous To Radiology 07/15/23 1408 07/16/23 1250   07/14/23 1200  vancomycin (VANCOREADY) IVPB 750 mg/150 mL  Status:  Discontinued        750 mg 150 mL/hr over 60 Minutes Intravenous Every M-W-F (Hemodialysis) 07/12/23 1203 07/19/23 1106   07/11/23 2330  vancomycin (VANCOREADY) IVPB 750 mg/150 mL        750 mg 150 mL/hr over 60 Minutes Intravenous  Once 07/11/23 2030 07/12/23 0110   07/11/23 1800  vancomycin (VANCOREADY) IVPB 750 mg/150 mL  Status:  Discontinued        750 mg 150 mL/hr over 60 Minutes Intravenous  Once 07/11/23 0856 07/11/23 1810   07/10/23 2318  vancomycin variable dose per unstable renal function (pharmacist dosing)  Status:  Discontinued         Does not apply See admin instructions 07/10/23 2319 07/12/23 1203   07/10/23 1429  vancomycin (VANCOREADY) IVPB 750 mg/150 mL  Status:  Discontinued        750 mg 150 mL/hr over 60 Minutes Intravenous Every Dialysis 07/10/23 1429 07/11/23 2030   07/10/23 1230  fidaxomicin (DIFICID) tablet 200 mg  Status:  Discontinued        200 mg Oral 2 times daily 07/10/23 1139 07/10/23 1207   07/10/23 1000  piperacillin-tazobactam (ZOSYN) IVPB 2.25 g  Status:  Discontinued        2.25 g 100 mL/hr over 30 Minutes Intravenous Every 8 hours 07/10/23  0908 07/10/23 1350   07/09/23 2330  vancomycin (VANCOREADY) IVPB 1750 mg/350 mL        1,750 mg 175 mL/hr over 120 Minutes Intravenous  Once 07/09/23 2329 07/10/23 0734   07/09/23 2330  piperacillin-tazobactam (ZOSYN) IVPB 3.375 g        3.375 g 100 mL/hr over 30 Minutes Intravenous  Once 07/09/23 2329 07/10/23 0240        DVT prophylaxis: Heparin  Code Status: Full code  Family Communication: No family at bedside   CONSULTS PCCM, nephrology, ID, CT surgery   Subjective   Denies shortness of breath   Objective    Physical Examination:   Appears in no acute distress S1-S2, regular Lungs clear to auscultation bilaterally Abdomen is soft, nontender, no organomegaly Extremities, status post left AKA   Status is: Inpatient:      Pressure Injury 08/03/23 Buttocks Right Stage 2 -  Partial thickness loss of dermis presenting as a shallow open injury with a red, pink wound bed without slough. Several small open areas, right buttocks area (Active)  08/03/23 1945  Location: Buttocks  Location Orientation: Right  Staging: Stage 2 -  Partial thickness loss of dermis presenting as a shallow open injury with a red, pink wound bed without slough.  Wound Description (Comments): Several small open areas, right buttocks area  Present on Admission:         [name] [name]   [facility] If 7PM-7AM, please contact night-coverage at [email or web address], Office  [phone]   08/05/2023, 8:30 AM  LOS: 26 days

## 2023-08-05 NOTE — Progress Notes (Signed)
PHARMACY - ANTICOAGULATION CONSULT NOTE  Pharmacy Consult for heparin Indication: DVT  No Known Allergies  Patient Measurements: Height: 5\' 7"  (170.2 cm) Weight: 79.6 kg (175 lb 7.8 oz) IBW/kg (Calculated) : 66.1 Heparin Dosing Weight: 75 kg  Vital Signs: Temp: 98.6 F (37 C) (11/07 0735) Temp Source: Axillary (11/07 0735) BP: 101/67 (11/07 0800) Pulse Rate: 115 (11/07 0800)  Labs: Recent Labs    08/03/23 0324 08/03/23 1042 08/04/23 0406 08/04/23 1310 08/04/23 2145 08/05/23 0508 08/05/23 0731  HGB 6.8*  --  8.1*  --   --  7.9*  --   HCT 22.1*  --  26.8*  --   --  25.3*  --   PLT 324  --  362  --   --  331  --   HEPARINUNFRC  --    < > 0.19* 0.34 0.13*  --  0.24*  CREATININE 3.82*  --  5.23*  --   --  3.71*  --    < > = values in this interval not displayed.    Estimated Creatinine Clearance: 26 mL/min (A) (by C-G formula based on SCr of 3.71 mg/dL (H)).   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  Has required multiple blood transfusions (5 units total, last 11/5 for Hgb 6.8).  Pharmacy consulted to dose heparin.  No anticoagulation PTA.    11/7 AM: Heparin level remains subtherapeutic at 0.24, following rate increase overnight to 2150 units/hour.  CBC stable, no signs of bleeding or issue with heparin infusion noted by RN.   Goal of Therapy:  Heparin level 0.3-0.7 units/ml Monitor platelets by anticoagulation protocol: Yes   Plan:  Increase heparin to 2350 units/hour  Will f/u 8 hr heparin level CBC daily    Jani Gravel, PharmD Clinical Pharmacist  08/05/2023 8:16 AM

## 2023-08-06 ENCOUNTER — Inpatient Hospital Stay (HOSPITAL_COMMUNITY): Payer: Medicare HMO

## 2023-08-06 DIAGNOSIS — J189 Pneumonia, unspecified organism: Secondary | ICD-10-CM | POA: Diagnosis not present

## 2023-08-06 DIAGNOSIS — R4182 Altered mental status, unspecified: Secondary | ICD-10-CM | POA: Diagnosis not present

## 2023-08-06 DIAGNOSIS — I69391 Dysphagia following cerebral infarction: Secondary | ICD-10-CM | POA: Diagnosis not present

## 2023-08-06 DIAGNOSIS — I059 Rheumatic mitral valve disease, unspecified: Secondary | ICD-10-CM | POA: Diagnosis not present

## 2023-08-06 LAB — GLUCOSE, CAPILLARY
Glucose-Capillary: 183 mg/dL — ABNORMAL HIGH (ref 70–99)
Glucose-Capillary: 206 mg/dL — ABNORMAL HIGH (ref 70–99)
Glucose-Capillary: 238 mg/dL — ABNORMAL HIGH (ref 70–99)
Glucose-Capillary: 243 mg/dL — ABNORMAL HIGH (ref 70–99)
Glucose-Capillary: 254 mg/dL — ABNORMAL HIGH (ref 70–99)
Glucose-Capillary: 275 mg/dL — ABNORMAL HIGH (ref 70–99)

## 2023-08-06 LAB — CBC
HCT: 26.3 % — ABNORMAL LOW (ref 39.0–52.0)
Hemoglobin: 8 g/dL — ABNORMAL LOW (ref 13.0–17.0)
MCH: 29.3 pg (ref 26.0–34.0)
MCHC: 30.4 g/dL (ref 30.0–36.0)
MCV: 96.3 fL (ref 80.0–100.0)
Platelets: 355 10*3/uL (ref 150–400)
RBC: 2.73 MIL/uL — ABNORMAL LOW (ref 4.22–5.81)
RDW: 17.6 % — ABNORMAL HIGH (ref 11.5–15.5)
WBC: 16 10*3/uL — ABNORMAL HIGH (ref 4.0–10.5)
nRBC: 0 % (ref 0.0–0.2)

## 2023-08-06 LAB — MAGNESIUM: Magnesium: 2.5 mg/dL — ABNORMAL HIGH (ref 1.7–2.4)

## 2023-08-06 LAB — VANCOMYCIN, RANDOM: Vancomycin Rm: 22 ug/mL

## 2023-08-06 LAB — HEPARIN LEVEL (UNFRACTIONATED): Heparin Unfractionated: 0.24 [IU]/mL — ABNORMAL LOW (ref 0.30–0.70)

## 2023-08-06 MED ORDER — INSULIN GLARGINE-YFGN 100 UNIT/ML ~~LOC~~ SOLN
5.0000 [IU] | Freq: Two times a day (BID) | SUBCUTANEOUS | Status: DC
  Administered 2023-08-06 – 2023-08-07 (×2): 5 [IU] via SUBCUTANEOUS
  Filled 2023-08-06 (×3): qty 0.05

## 2023-08-06 MED ORDER — MIDODRINE HCL 5 MG PO TABS
15.0000 mg | ORAL_TABLET | Freq: Three times a day (TID) | ORAL | Status: DC
  Administered 2023-08-07: 15 mg
  Filled 2023-08-06 (×2): qty 3

## 2023-08-06 MED ORDER — HEPARIN SODIUM (PORCINE) 1000 UNIT/ML IJ SOLN
INTRAMUSCULAR | Status: AC
  Administered 2023-08-06: 4600 [IU]
  Filled 2023-08-06: qty 5

## 2023-08-06 NOTE — Progress Notes (Signed)
Physical Therapy Treatment Patient Details Name: [name] MRN: [id number] DOB: Oct 14, 1980 Today's Date: 08/06/2023   History of Present Illness Patient is a 42 y/o male admitted 07/09/23 with confusion found to be in septic shock found to have MRSA in blood cultures, in DKA, TEE positive for vegitation on multiple valves, MRI showing R MCA infarct.  Pt with resp failure on 10/21 resulting in intubation and transfer to [facility]; extubated 10/24.  CRRT started 10/22-stopped 10/30.  PMH positive for ESRD on HD, IDDM, gastroparesis, L AKA, HTN.    PT Comments  Patient progressing with sitting balance and participating in basic ADL during co-treat with OT this session.  He was more interactive, cooperative and eager to improve.  Patient will continue to benefit from skilled PT in the acute setting.  Recommend LTACH versus inpatient rehab (<3 hours/day) at d/c.    If plan is discharge home, recommend the following: Two people to help with walking and/or transfers;Assist for transportation;Supervision due to cognitive status;Two people to help with bathing/dressing/bathroom;Help with stairs or ramp for entrance   Can travel by private vehicle     No  Equipment Recommendations  Other (comment) (TBA)    Recommendations for Other Services       Precautions / Restrictions Precautions Precautions: Fall Precaution Comments: LAKA, L hemiparesis, R wrist restraint, flexiseal, coretrak, blood clot LUE Restrictions Weight Bearing Restrictions: Yes LLE Weight Bearing: Non weight bearing     Mobility  Bed Mobility Overal bed mobility: Needs Assistance Bed Mobility: Supine to Sit, Sit to Supine     Supine to sit: +2 for physical assistance, Total assist, HOB elevated Sit to supine: Max assist, +2 for physical assistance   General bed mobility comments: assist to scoot hips and lift trunk    Transfers                   General transfer comment: NT    Ambulation/Gait                    Stairs             Wheelchair Mobility     Tilt Bed    Modified Rankin (Stroke Patients Only) Modified Rankin (Stroke Patients Only) Pre-Morbid Rankin Score: Moderate disability Modified Rankin: Severe disability     Balance Overall balance assessment: Needs assistance Sitting-balance support: Single extremity supported (and R LE supported at times) Sitting balance-[name]: Poor Sitting balance - Comments: on EOB about 15 minutes, at times pushing to L or leaning back with OT behind to support; at times allowed hip to fall to L so he could self correct with increased time, cues and assist to maintain safety Postural control: Left lateral lean                                  Cognition Arousal: Alert Behavior During Therapy: WFL for tasks assessed/performed Overall Cognitive Status: Impaired/Different from baseline Area of Impairment: Orientation, Attention, Memory, Following commands, Safety/judgement, Awareness, Problem solving                 Orientation Level: Disoriented to, Place, Time Current Attention Level: Focused Memory: Decreased short-term memory Following Commands: Follows one step commands inconsistently, Follows one step commands with increased time Safety/Judgement: Decreased awareness of deficits, Decreased awareness of safety Awareness: Intellectual Problem Solving: Slow processing, Decreased initiation, Difficulty sequencing, Requires verbal cues, Requires tactile cues  Exercises      General Comments General comments (skin integrity, edema, etc.): performed manual lymph drainage to L UE due to significant edema, plan to place compression stockinette and elevated at end of session; VSS during session at EOB today and pt able to complete oral care brushing teeth with max A for balance and mod cues for completing parts of task      Pertinent Vitals/Pain Pain Assessment Pain Assessment: Faces Faces  Pain Scale: No hurt    Home Living                          Prior Function            PT Goals (current goals can now be found in the care plan section) Progress towards PT goals: Progressing toward goals    Frequency    Min 1X/week      PT Plan      Co-evaluation PT/OT/SLP Co-Evaluation/Treatment: Yes Reason for Co-Treatment: Necessary to address cognition/behavior during functional activity;For patient/therapist safety;To address functional/ADL transfers PT goals addressed during session: Mobility/safety with mobility;Balance OT goals addressed during session: ADL's and self-care      AM-PAC PT "6 Clicks" Mobility   Outcome Measure  Help needed turning from your back to your side while in a flat bed without using bedrails?: Total Help needed moving from lying on your back to sitting on the side of a flat bed without using bedrails?: Total Help needed moving to and from a bed to a chair (including a wheelchair)?: Total Help needed standing up from a chair using your arms (e.g., wheelchair or bedside chair)?: Total Help needed to walk in hospital room?: Total Help needed climbing 3-5 steps with a railing? : Total 6 Click Score: 6    End of Session   Activity Tolerance: Patient tolerated treatment well Patient left: in bed;with call bell/phone within reach   PT Visit Diagnosis: Other abnormalities of gait and mobility (R26.89);Other symptoms and signs involving the nervous system (R29.898);Hemiplegia and hemiparesis Hemiplegia - Right/Left: Left Hemiplegia - dominant/non-dominant: Non-dominant Hemiplegia - caused by: Cerebral infarction     Time: 1002-1035 PT Time Calculation (min) (ACUTE ONLY): 33 min  Charges:    $Therapeutic Activity: 8-22 mins PT General Charges $$ ACUTE PT VISIT: 1 Visit                     Sheran Lawless, PT Acute Rehabilitation Services [phone] 08/06/2023    Elray Mcgregor 08/06/2023, 1:46 PM

## 2023-08-06 NOTE — Procedures (Signed)
Patient seen on Hemodialysis. BP (!) 145/111   Pulse (!) 119   Temp 100.1 F (37.8 C) (Oral)   Resp 12   Ht 5\' 7"  (1.702 m)   Wt 83.4 kg   SpO2 99%   BMI 28.80 kg/m   QB 400, UF goal 2.5L Tolerating treatment without complaints at this time.   Zetta Bills MD [facility]. Office # [phone] Pager # [phone] 4:21 PM

## 2023-08-06 NOTE — Progress Notes (Signed)
Patient ID: [name], male   DOB: June 19, 1981, 42 y.o.   MRN: [id number] [facility] Progress Note   Assessment/ Plan:   1.  MRSA bacteremia with mitral valve endocarditis: Suspected to be related to catheter related bacteremia and is status post line holiday with new left femoral TDC placed by IR on 10/18 (has occlusion of bilateral subclavian veins limiting his options for dialysis access).  On intravenous vancomycin and exploring the option for transfer to tertiary center for MV intervention. 2.  Acute ischemic CVA: Secondary to septic infarct on 10/17, appears to have had some neurological impact and likely to affect disposition at discharge. 3. ESRD: Usually on a Monday/Wednesday/Friday schedule and on schedule for his next hemodialysis treatment again today.  4. Anemia: Hemoglobin/hematocrit appear stable on the current ESA dose. 5. CKD-MBD: Calcium and phosphorus levels currently acceptable level, monitor on renal diet.  On calcitriol for PTH control. 6. Nutrition: Continue tube feeds via NG tube as mental status improves and allows for reevaluation of swallow. 7. Hypotension: Weaned off of pressors, blood pressures currently within acceptable range.  Subjective:   No acute events overnight, trying to establish disposition.   Objective:   BP (!) 154/89 (BP Location: Right Leg)   Pulse (!) 112   Temp 98.9 F (37.2 C) (Oral)   Resp 11   Ht 5\' 7"  (1.702 m)   Wt 79.6 kg   SpO2 95%   BMI 27.49 kg/m   Physical Exam: Gen: Awake/alert and comfortable resting in bed.  CVS: Regular tachycardia, S1 and S2 Resp: Transmitted breath sounds, no distinct rales or rhonchi.  Oxygen via nasal cannula Abd: Soft, flat, nontender, bowel sounds normal Ext: Status post left AKA, no edema right leg.  Left femoral TDC in place  Labs: BMET Recent Labs  Lab 07/31/23 0358 08/01/23 0331 08/02/23 0410 08/03/23 0324 08/04/23 0406 08/05/23 0508  NA 133* 133* 134* 133* 132* 130*   K 4.9 5.5* 5.5* 4.3 4.5 3.6  CL 94* 92* 90* 93* 92* 90*  CO2 26 28 29 28 25 25   GLUCOSE 314* 144* 234* 201* 158* 234*  BUN 45* 80* 108* 62* 107* 45*  CREATININE 3.14* 4.54* 6.02* 3.82* 5.23* 3.71*  CALCIUM 8.3* 8.8* 9.2 9.0 9.2 8.7*  PHOS  --  5.5* 6.0* 3.5 4.6 4.2   CBC Recent Labs  Lab 08/03/23 0324 08/04/23 0406 08/05/23 0508 08/06/23 0740  WBC 17.5* 16.9* 14.2* 16.0*  HGB 6.8* 8.1* 7.9* 8.0*  HCT 22.1* 26.8* 25.3* 26.3*  MCV 97.8 94.7 96.2 96.3  PLT 324 362 331 355    Medications:     calcitRIOL  1.5 mcg Per Tube Q M,W,F   Chlorhexidine Gluconate Cloth  6 each Topical Q0600   darbepoetin (ARANESP) injection - DIALYSIS  40 mcg Subcutaneous Q Fri-1800   feeding supplement (PROSource TF20)  60 mL Per Tube Daily   fiber supplement (BANATROL TF)  60 mL Per Tube BID   gabapentin  300 mg Per Tube QHS   insulin aspart  0-9 Units Subcutaneous Q4H   insulin glargine-yfgn  5 Units Subcutaneous BID   lidocaine  1 Application Topical Once   midodrine  15 mg Per Tube Q8H   multivitamin  1 tablet Per Tube QHS   nutrition supplement (JUVEN)  1 packet Per Tube BID BM   mouth rinse  15 mL Mouth Rinse 4 times per day   QUEtiapine  50 mg Per Tube QHS   QUEtiapine  50 mg  Per Tube [id number]   Zetta Bills, MD 08/06/2023, 12:04 PM

## 2023-08-06 NOTE — Progress Notes (Signed)
PT Note:  While pt in dialysis attempted to apply 6cm tubular compression sleeve to L arm for edema control.  Felt too tight and pt not aware of that arm so high risk for skin breakdown without close monitoring.  Will follow up next week possibly for wrapping with short stretch bandages when close monitoring can occur.  Recommend nursing continue elevation of that arm and RN in dialysis removed hospital bracelets from that arm and placed on R.  OT splint to arrive today or tomorrow for positioning.  PT will follow up.  Sheran Lawless, PT Acute Rehabilitation Services Office:[phone] 08/06/2023 [id number] 1 TA

## 2023-08-06 NOTE — Progress Notes (Signed)
PHARMACY - ANTICOAGULATION CONSULT NOTE  Pharmacy Consult for heparin Indication: DVT  No Known Allergies  Patient Measurements: Height: 5\' 7"  (170.2 cm) Weight: 79.6 kg (175 lb 7.8 oz) IBW/kg (Calculated) : 66.1 Heparin Dosing Weight: 75 kg  Vital Signs: Temp: 98.6 F (37 C) (11/08 0732) Temp Source: Oral (11/08 0732) BP: 146/93 (11/08 0732) Pulse Rate: 119 (11/08 0732)  Labs: Recent Labs    08/04/23 0406 08/04/23 1310 08/04/23 2145 08/05/23 0508 08/05/23 0731 08/05/23 1701  HGB 8.1*  --   --  7.9*  --   --   HCT 26.8*  --   --  25.3*  --   --   PLT 362  --   --  331  --   --   HEPARINUNFRC 0.19*   < > 0.13*  --  0.24* 0.35  CREATININE 5.23*  --   --  3.71*  --   --    < > = values in this interval not displayed.    Estimated Creatinine Clearance: 26 mL/min (A) (by C-G formula based on SCr of 3.71 mg/dL (H)).   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  Has required multiple blood transfusions (5 units total, last 11/5 for Hgb 6.8).  Patient is on HD. Pharmacy consulted to dose heparin.  No anticoagulation PTA.    11/9: Heparin level repeat is again subtherapeutic at 0.24 on 2400 units/hr.  No issues with infusion or bleeding per RN. Hgb stable.    Goal of Therapy:  Heparin level 0.3-0.7 units/ml Monitor platelets by anticoagulation protocol: Yes   Plan:  Increase heparin to 2600 units/hour  Monitor daily heparin level, CBC, signs/symptoms of bleeding    Jani Gravel, PharmD Clinical Pharmacist  08/06/2023 7:55 AM

## 2023-08-06 NOTE — Progress Notes (Signed)
Orthopedic Tech Progress Note Patient Details:  BENET KITCHELL 1981-02-17 [id number]  Patient ID: [name], male   DOB: 20-Apr-1981, 42 y.o.   MRN: [id number] [facility] order placed for resting hand splint. [name] 08/06/2023, 1:02 PM

## 2023-08-06 NOTE — Progress Notes (Signed)
Pharmacy Antibiotic Note  [name] is a 42 y.o. 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 Pharmacy has been consulted for Vancomycin dosing.  Per chart notes, next scheduled HD will be Friday 11/8. Pre-HD vancomycin random level is 22 (in range). Plan to continue current regimen.   Plan: - Schedule vancomycin 750 mg IV q HD - Will monitor HD tolerance and dosing - Follow-up duration of treatment   Height: 5\' 7"  (170.2 cm) Weight: 79.6 kg (175 lb 7.8 oz) IBW/kg (Calculated) : 66.1  Temp (24hrs), Avg:99 F (37.2 C), Min:98.5 F (36.9 C), Max:100 F (37.8 C)  Recent Labs  Lab 08/01/23 0331 08/02/23 0410 08/03/23 0324 08/04/23 0406 08/05/23 0508 08/06/23 0740 08/06/23 0831  WBC 20.0* 17.9* 17.5* 16.9* 14.2* 16.0*  --   CREATININE 4.54* 6.02* 3.82* 5.23* 3.71*  --   --   VANCORANDOM  --   --   --   --   --   --  22    Estimated Creatinine Clearance: 26 mL/min (A) (by C-G formula based on SCr of 3.71 mg/dL (H)).    No Known Allergies  Antimicrobials this admission: Vancomycin 10/12 >> Cefepime 10/21 >> 10/23; restart 10/25 >> 10/29  Microbiology results: 10/12 MRSA PCR >> positive 10/12 CDiff antigen +, toxin -, PCR - >> negative 10/12 BCx >> 4/4 MRSA 10/13 BCx >> NGf 10/17 BCx >> NGf 10/21 RCx >> NOF  Thank you for allowing pharmacy to be a part of this patient's care.  Jani Gravel, PharmD Clinical Pharmacist  08/06/2023 11:23 AM

## 2023-08-06 NOTE — Procedures (Signed)
HD Note:  Some information was entered later than the data was gathered due to patient care needs. The stated time with the data is accurate.  Received patient in bed to unit.   Alert and oriented.   Informed consent signed and in chart.   Access used: Left femoral HD catheter Access issues: None  Patient tolerated treatment well.   TX duration: 3.25 hours  Alert, without acute distress.  Total UF removed: 2500 cc  Hand-off given to patient's nurse.   Transported back to the room   Asiana Benninger L. Dareen Piano, RN Kidney Dialysis Unit.

## 2023-08-06 NOTE — Progress Notes (Signed)
Occupational Therapy Treatment Patient Details Name: [name] MRN: [id number] DOB: [birth year] Today's Date: 08/06/2023   History of present illness Patient is a 42 y/o male admitted 07/09/23 with confusion found to be in septic shock found to have MRSA in blood cultures, in DKA, TEE positive for vegitation on multiple valves, MRI showing R MCA infarct.  Pt with resp failure on 10/21 resulting in intubation and transfer to [facility]; extubated 10/24.  CRRT started 10/22-stopped 10/30.  PMH positive for ESRD on HD, IDDM, gastroparesis, L AKA, HTN.   OT comments  This 42 yo male making progress today with starting with grooming (brushing teeth) sitting EOB and with sitting balance at EOB. He will continue to benefit from acute OT with follow up from continued inpatient follow up therapy, <3 hours/day.       If plan is discharge home, recommend the following:  Two people to help with walking and/or transfers;Two people to help with bathing/dressing/bathroom;Assistance with cooking/housework;Assistance with feeding;Help with stairs or ramp for entrance;Assist for transportation;Direct supervision/assist for financial management;Direct supervision/assist for medications management   Equipment Recommendations  Other (comment) (TBD next vneue)       Precautions / Restrictions Precautions Precautions: Fall Precaution Comments: LAKA, L hemiparesis, R wrist restraint, flexiseal, coretrak, blood clot LUE Restrictions Weight Bearing Restrictions: Yes LLE Weight Bearing: Non weight bearing       Mobility Bed Mobility Overal bed mobility: Needs Assistance Bed Mobility: Supine to Sit, Sit to Supine     Supine to sit: +2 for physical assistance, Total assist, HOB elevated Sit to supine: Max assist, +2 for physical assistance   General bed mobility comments: A for UB and LB       Balance Overall balance assessment: Needs assistance Sitting-balance support: Single extremity supported (RLE  supported) Sitting balance-[name]:  (varied from fair to zero) Sitting balance - Comments: In sitting pt tends to push with RUE. We did give him more leeway to "fall" in any direction while seated EOB and then correct it and he was min A-to contact guard to fix  his balance with VCs and he kept saying "I told you, I am not goin to fall" Postural control: Left lateral lean (mainly due to pushing with RUE)                                 ADL either performed or assessed with clinical judgement   ADL Overall ADL's : Needs assistance/impaired     Grooming: Oral care;Sitting Grooming Details (indicate cue type and reason): EOB (balance varied from Mod-min guard); Had to apply toothpaste to toothbrush for patient, A him to bring to mouth, then he brushed on his own with cues to stop and spit, A to bring thickend water to his mouth, then he could swish and spit.                                    Extremity/Trunk Assessment Upper Extremity Assessment Upper Extremity Assessment: Left hand dominant LUE Deficits / Details: No active movement LUE Sensation: decreased light touch LUE Coordination: decreased fine motor;decreased gross motor            Vision Baseline Vision/History: 0 No visual deficits Ability to See in Adequate Light: 0 Adequate Patient Visual Report: No change from baseline Vision Assessment?: No apparent visual deficits  Cognition Arousal: Alert Behavior During Therapy: WFL for tasks assessed/performed Overall Cognitive Status: Impaired/Different from baseline Area of Impairment: Orientation, Attention, Memory, Following commands, Safety/judgement, Awareness, Problem solving                 Orientation Level: Place, Disoriented to Current Attention Level: Focused Memory: Decreased short-term memory Following Commands: Follows one step commands inconsistently, Follows one step commands with increased  time Safety/Judgement: Decreased awareness of deficits, Decreased awareness of safety Awareness: Intellectual Problem Solving: Slow processing, Decreased initiation, Difficulty sequencing, Requires verbal cues, Requires tactile cues                     Pertinent Vitals/ Pain       Pain Assessment Pain Assessment: Faces Faces Pain Scale: No hurt         Frequency  Min 2X/week        Progress Toward Goals  OT Goals(current goals can now be found in the care plan section)  Progress towards OT goals: Progressing toward goals  Acute Rehab OT Goals OT Goal Formulation: With patient/family Time For Goal Achievement: 08/16/23 Potential to Achieve Goals: Good  Plan      Co-evaluation    PT/OT/SLP Co-Evaluation/Treatment: Yes Reason for Co-Treatment: Necessary to address cognition/behavior during functional activity;For patient/therapist safety;To address functional/ADL transfers PT goals addressed during session: Mobility/safety with mobility OT goals addressed during session: ADL's and self-care      AM-PAC OT "6 Clicks" Daily Activity     Outcome Measure   Help from another person eating meals?: A Lot Help from another person taking care of personal grooming?: A Lot Help from another person toileting, which includes using toliet, bedpan, or urinal?: Total Help from another person bathing (including washing, rinsing, drying)?: Total Help from another person to put on and taking off regular upper body clothing?: Total Help from another person to put on and taking off regular lower body clothing?: Total 6 Click Score: 8    End of Session    OT Visit Diagnosis: Other abnormalities of gait and mobility (R26.89);Other symptoms and signs involving the nervous system (R29.898);Hemiplegia and hemiparesis;Muscle weakness (generalized) (M62.81);Other symptoms and signs involving cognitive function Hemiplegia - Right/Left: Left Hemiplegia - dominant/non-dominant:  Non-Dominant Hemiplegia - caused by: Cerebral infarction   Activity Tolerance Patient tolerated treatment well   Patient Left in bed;with call bell/phone within reach;with bed alarm set           Time: 1003-1043 OT Time Calculation (min): 40 min  Charges: OT General Charges $OT Visit: 1 Visit OT Treatments $Self Care/Home Management : 8-22 mins $Therapeutic Activity: 8-22 mins  [name] OT Acute Rehabilitation Services Office [phone]   [name] 08/06/2023, 12:42 PM

## 2023-08-06 NOTE — Evaluation (Signed)
Modified Barium Swallow Study  Patient Details  Name: [name] MRN: [id number] Date of Birth: [birth year]  Today's Date: 08/06/2023  Modified Barium Swallow completed.  Full report located under Chart Review in the Imaging Section.  History of Present Illness Pt is a 42 yo male presenting 10/11 with AMS. His dialysis catheter was partially removed and he was in DKA. Admitted for septic shock from MRSA bacteremia; infective endocarditis. LUE weakness was noted 10/17 and MRI confirmed sizable infarct in the R MCA branch as well as small acute infarcts in the L occipital lobe and bilateral cerebellum. Pt initially evaluated by SLP 10/18 and recommended to have Dys 3 diet and thin liquids due primarily oral symptoms. Pt had worsening hypoxia and AMS after vomiting on 10/21 requiring intubation 10/21-10/24. 10/22-10/30 CRRT. PMH includes: ESRD, DM, gastroparesis, L AKA, HTN, recent admission in July for sepsis secondary to R diabetic foot ulcer (left AMA)   Clinical Impression Mr. Trojanowski presents with a primary oral dysphagia, exacerbated by poor awareness and attention to task.  Left-sided sensorimotor deficits led to impaired bolus control/manipulation, with material sitting in left lateral sulcus/cheek with no awareness, liquids spilling from mouth onto chin, as well as posteriorly into pharynx.  With thin liquids, there was a trace amount that spilled into the larynx and airway without eliciting a cough response.  Aspiration was miniscule and occurred prior to swallow initiation, related primarily to poor oral control. With thicker liquids and purees, pt had more time to prepare for the swallow. There was adequate laryngeal-vestibule closure, base-of-tongue contact with posterior wall, and pharyngeal stripping. There was penetration/aspiration with these consistencies, and there was no pharyngeal residue after the swallow.  Chewable solids were trapped in left lateral sulci with pieces left unchewed,  requiring oral suctioning and liquid flush to remove. Recommend starting a dysphagia 1 diet with nectar-thick liquids.  Because he required near-constant cues to attend to POs, it is unlikely he will be able to meet his nutritional needs by mouth.  Recommend beginning conversations with pt/family about G-tube for longer-term support.  He should be encouraged to drink/eat to supplement TF, with long-term plan to restore oral intake. Factors that may increase risk of adverse event in presence of aspiration Rubye Oaks & Clearance Coots 2021): Reduced cognitive function  Swallow Evaluation Recommendations Recommendations: PO diet PO Diet Recommendation: Dysphagia 1 (Pureed);Mildly thick liquids (Level 2, nectar thick) Liquid Administration via: Cup;Straw Medication Administration: Via alternative means Supervision: Full assist for feeding Swallowing strategies  : Check for anterior loss;Check for pocketing or oral holding Oral care recommendations: Oral care BID (2x/day)     Zakkiyya Barno L. Samson Frederic, MA CCC/SLP Clinical Specialist - Acute Care SLP Acute Rehabilitation Services Office number [phone]  [name] 08/06/2023,9:41 AM

## 2023-08-06 NOTE — TOC Progression Note (Addendum)
Transition of Care [facility]) - Progression Note    Patient Details  Name: [name] MRN: [id number] Date of Birth: 06-03-1981  Transition of Care [facility]) CM/SW Contact  Leone Haven, RN Phone Number: 08/06/2023, 3:34 PM  Clinical Narrative:    Per pt eval rec LTACH , NCM asked rep with [facility] if patient would be a candidate.  DJ with [facility] states he knows they do not have any HD beds at this time and it will be a while before they get any HD beds. Per MD note, On intravenous vancomycin and exploring the option for transfer to tertiary center for MV intervention.      Barriers to Discharge: Continued Medical Work up  Expected Discharge Plan and Services       Living arrangements for the past 2 months: Single Family Home                                       Social Determinants of Health (SDOH) Interventions SDOH Screenings   Food Insecurity: No Food Insecurity (08/03/2023)  Housing: Low Risk  (08/03/2023)  Transportation Needs: No Transportation Needs (08/03/2023)  Utilities: Not At Risk (08/03/2023)  Alcohol Screen: Low Risk  (02/15/2023)  Depression (PHQ2-9): Low Risk  (02/15/2023)  Financial Resource Strain: Low Risk  (02/15/2023)  Physical Activity: Inactive (02/15/2023)  Social Connections: Socially Integrated (02/15/2023)  Stress: No Stress Concern Present (02/15/2023)  Tobacco Use: Low Risk  (08/03/2023)    Readmission Risk Interventions     No data to display

## 2023-08-06 NOTE — Progress Notes (Signed)
[facility]  PROGRESS NOTE  [name] ZOX:[id number] DOB: [birth year] DOA: 07/09/2023 PCP: [name], NP   Brief HPI:    42 yo M w/ pertinent PMH ESRD on HD MWF, IDDM, gastroparesis, left AKA, HTN presents to [facility] on 10/11 w/ AMS.   Patient recently admitted to [facility] on 7/12 sepsis from right diabetic foot ulcer. Cultures w/ MRSA. Patient had dialysis catheter removed and replaced. ID treated w/ vanc. Patient refused echo and left on 7/17 against medical advise.    On 10/11 patient was found by family member on ground altered. EMS arrived and dialysis catheter partially removed. Patient more alert for them. Has been complaining of diarrhea x1 week per family. Unsure if patient went to dialysis today. Transferred to [facility]. On arrival bp stable 107/70 and afebrile. Patient remains confused and occasionally with agitation. Given dilaudid. CBG 497. Beta H >8. VBG ph 7.06, 16, 54, 4.7. Given iv fluids and started on insulin drip for DKA. CT head negative for acute abnormality. Cxr no significant findings. CT abd/pelvis no acute findings; possible steatosis vs. Liver mass 7 x 4 cm recommending f/u w/ MRI. WBC 20.7 and LA 2.4. Sepsis protocol initiated. Cultures obtained and started on vanc/zosyn. Given ams, dka, and soft bp pccm consulted for icu admission.   10/21 pccm called overnight to assess for worsening hypoxia and AMS after vomiting. On NRB w/ sats 70-80s. Hypoglycemic given dextrose which improved his mental status some. Transferred to 2H icu for intubation.   Patient was extubated, transferred to progressive care unit under [facility] service on 08/04/2023  10/12: altered mental status, DKA 10/12: blood cultures positive for MRSA 10/13 TTE LVEF 25-30% w/ severely depressed LVF, global hypokinesis, gd I diastolic dysfxn, mod RV fxn reduction. Possible filamentous structure on MV 10/16 TEE LVEF 30-35%RV fxn nml. Large mass (2.00 cm x 0.88 cm ) on the posterior mitral leaflet which  appeared to be extending in the mitral annulus. There are two mobile masses visible on the anterior leaflet of the tricupid valve which also appears to have a perforation. Another mobile  mass ( 0.68 cm x 0.67 cm) on the posterior tricupid leaflet. Moderate Tricuspid regurgitation which appears to  be through the leaflet perforation  10/16 seen by Cardiothoracic surg. Felt poor candidate. Recommended 6 weeks abx and recommended repeat TEE 1 week 10/17 noting left arm weakness. Not clear when started.  MRI showed Right MCA infarct w/ acute infarcts in left occipital and bilateral cerebellum felt c/w septic emboli. Neuro consulted.  10/18 tunneled left femoral vasc cath placed after catheter holiday  10/20 palliative care consulted later that day worsening hypoxia. Sats 70s. After vomiting. More lethargic  10/21: resp failure requiring intubation shortly after midnight  10/22- TEE 10/22 with EF 35%, LV global hypokinesis. Very large vegetation lateral scallop of posterior mitral leaflet 3.9cm length 1 cm width, highly mobile, prolapsing into LV. No large vegetation seen on TV.  Later that afternoon and abrupt hemodynamic changes requiring escalation of vasopressor support.  Felt given the size of his vegetation and increased bulk of the vegetation infections unlikely to clear.  He was felt to be decompensation may have due to of breath leaflet perforation and acute mitral valve regurgb 10/23: per nursing less responsive than yesterday, increased ETT secretions. Clotting off CRRT. Ct head showing expected evolution of the right MCA stroke.  Cardiothoracic surgery re-consulted for worsening MVE. Felt too high risk here. Recommended referral to tertiary care  10/24 agitated when  sedation decreased, is purposeful but does not follow commands. Tolerating UF gaols of -150. Still on NE infusion but extubated later that afternoon after passing SBT 10/25 failed swallow eval still on just 3 lpm O2. Encephalopathic.  Added seroquel in effort to get him off the precedex. Cardiology told no beds at [facility]. Not clear if accepted or not. Worsening pressor requirements. Got blood for hgb 6. Cefepime resumed for clinical decline and leukocytosis  10/26 Hemoglobin again dropped to 6.3 overnight with increased pressor requirement yesterday afternoon now on vaso and levo. 10/27 Hypoglycemic episode overnight, mental status improved a little over weekend  10/28 Getting another unit of blood more awake.  Oriented x 2.  Still on Precedex.  Having liquid stools. 10/30 Off Precedex, low-dose levo.  Plan to transition to IHD 10/29 Cefepime for aspiration course completed.  10/31 Seen by overnight team for hypoxia and somnolence ABG with mild hypercapnia. Again declined by [facility] because they were at capacity. More awake. Doing better w/ Bed side swallow but still likely aspirating. CRRT stopped.  11/1 Adjustments made for better glycemic control.  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 - Low dose metoprolol held in the setting of hypotension, consider slow resumption -  Definitive management is valve repair/replacement - Cardiology/TCTS consulted, recommend transfer to tertiary care center due to high-risk patient/procedure - [facility] has denied transfer x 2 (?capacity), [facility] denied for multiple prohibitive surgical risks -Again called [facility] for transfer on 08/05/2023, declined at this time due to capacity.   Ongoing sinus tachycardia, ?r/t LUE DVT Heart rate remains 130s to 140s. - Cardiac monitoring - Optimize electrolytes for K > 4, Mg > 2 - Consider resumption of low-dose metoprolol, given ongoing tachycardic and improved BP   LUE DVT Dopplers 11/4 positive for L IJ, L [place], L axillary veins; septic versus VTE. 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 - Delirium precautions - Klonopin PRN - Continue Seroquel - PT/OT/SLP as able to participate in care   End-stage renal disease on hemodialysis (MWF at baseline) Has occluded bilateral [place] veins  Mild hyperkalemia off CRRT - Nephrology following, appreciate assistance - Successfully transitioned to [facility], next 11/6 - Trend BMP - Replete electrolytes as indicated - Monitor I&Os - Avoid nephrotoxic agents as able - Ensure adequate renal perfusion   Dysphagia - TF via Cortrak -Speech therapy worked with patient today, was able to swallow food better today -Started on dysphagia 1 diet with nectar thick liquid -Will  see how he does with diet over next few days -Might need long-term PEG tube placement  T2DM w/ hyperglycemia and hypoglycemia, brittle - Basal Semglee 5U daily - SSI - CBGs Q4H - Goal CBG 140-180   Anemia and thrombocytopenia critical illness [name]/p 5 units total PRBC. - Trend H&H - Monitor for signs of active bleeding - Transfuse for Hgb <  7.0 or hemodynamically significant bleeding - 1U PRBCs given 11/5AM for Hgb 6.8.   Incidental finding of Liver steatosis vs. liver mass CT A/P showing 7 x 4 cm density in anterior liver. - Outpatient surveillance/GI follow up    Medications     calcitRIOL  1.5 mcg Per Tube Q M,W,F   Chlorhexidine Gluconate Cloth  6 each Topical Q0600   darbepoetin (ARANESP) injection - DIALYSIS  40 mcg Subcutaneous Q Fri-1800   feeding supplement (PROSource TF20)  60 mL Per Tube Daily   fiber supplement (BANATROL TF)  60 mL Per Tube BID   gabapentin  300 mg Per Tube QHS   insulin aspart  0-9 Units Subcutaneous Q4H   insulin glargine-yfgn  5 Units Subcutaneous Daily   lidocaine  1 Application Topical Once   midodrine  20 mg Per Tube Q8H   multivitamin  1 tablet Per Tube QHS   nutrition supplement (JUVEN)  1 packet Per Tube BID BM   mouth rinse  15 mL Mouth Rinse 4 times per day   QUEtiapine  50 mg Per Tube QHS   QUEtiapine  50 mg Per Tube Q0600     Data Reviewed:   CBG:  Recent Labs  Lab 08/05/23 1100 08/05/23 1612 08/05/23 2048 08/06/23 0004 08/06/23 0436  GLUCAP 277* 207* 170* 275* 238*    SpO2: 97 % O2 Flow Rate (L/min): 2 L/min FiO2 (%): 32 %    Vitals:   08/05/23 1800 08/05/23 1935 08/06/23 0008 08/06/23 0400  BP: (!) 154/99 (!) 141/78 (!) 140/84 124/77  Pulse: (!) 113 (!) 110 (!) 125 (!) 109  Resp: 19 14 19 17   Temp:  100 F (37.8 C) 98.5 F (36.9 C) 98.9 F (37.2 C)  TempSrc:   Oral Oral  SpO2: 98% 97% 93% 97%  Weight:      Height:          Data Reviewed:  Basic Metabolic Panel: Recent Labs  Lab 08/01/23 0331 08/02/23 0410 08/03/23 0324 08/04/23 0406 08/05/23 0508  NA 133* 134* 133* 132* 130*  K 5.5* 5.5* 4.3 4.5 3.6  CL 92* 90* 93* 92* 90*  CO2 28 29 28 25 25   GLUCOSE 144* 234* 201* 158* 234*  BUN 80* 108* 62* 107* 45*  CREATININE 4.54* 6.02* 3.82* 5.23* 3.71*  CALCIUM 8.8* 9.2 9.0 9.2 8.7*  MG 2.4 2.7* 2.0 2.3 2.1  PHOS 5.5* 6.0* 3.5 4.6 4.2     CBC: Recent Labs  Lab 08/01/23 0331 08/02/23 0410 08/03/23 0324 08/04/23 0406 08/05/23 0508  WBC 20.0* 17.9* 17.5* 16.9* 14.2*  HGB 7.6* 7.0* 6.8* 8.1* 7.9*  HCT 25.2* 23.5* 22.1* 26.8* 25.3*  MCV 99.2 100.0 97.8 94.7 96.2  PLT 304 312 324 362 331    LFT No results for input([name]): "AST", "ALT", "ALKPHOS", "BILITOT", "PROT", "ALBUMIN" in the last 168 hours.   Antibiotics: Anti-infectives (From admission, onward)    Start     Dose/Rate Route Frequency Ordered Stop   08/02/23 1200  vancomycin (VANCOREADY) IVPB 750 mg/150 mL        750 mg 150 mL/hr over 60  Minutes Intravenous Every M-W-F (Hemodialysis) 07/31/23 1404     07/30/23 1800  vancomycin (VANCOREADY) IVPB 750 mg/150 mL        750 mg 150 mL/hr over 60 Minutes Intravenous Every M-W-F (Hemodialysis) 07/30/23 1358 07/30/23 1923   07/29/23 0812  vancomycin variable dose per unstable renal function (pharmacist dosing)  Status:  Discontinued         Does not apply See admin instructions 07/29/23 0813 07/31/23 1404   07/28/23 1000  vancomycin (VANCOREADY) IVPB 750 mg/150 mL  Status:  Discontinued        750 mg 150 mL/hr over 60 Minutes Intravenous Every 24 hours 07/27/23 1539 07/29/23 0813   07/27/23 1630  vancomycin (VANCOREADY) IVPB 500 mg/100 mL        500 mg 100 mL/hr over 60 Minutes Intravenous  Once 07/27/23 1539 07/27/23 1759   07/23/23 1945  ceFEPIme (MAXIPIME) 2 g in sodium chloride 0.9 % 100 mL IVPB  Status:  Discontinued        2 g 200 mL/hr over 30 Minutes Intravenous Every 12 hours 07/23/23 1856 07/27/23 1053   07/21/23 1200  vancomycin (VANCOREADY) IVPB 750 mg/150 mL  Status:  Discontinued        750 mg 150 mL/hr over 60 Minutes Intravenous Every M-W-F (Hemodialysis) 07/19/23 1106 07/20/23 0842   07/21/23 0230  vancomycin (VANCOREADY) IVPB 750 mg/150 mL  Status:  Discontinued        750 mg 150 mL/hr over 60 Minutes Intravenous Every 24 hours 07/20/23 1451 07/27/23 1314   07/20/23 2200  ceFEPIme (MAXIPIME) 2  g in sodium chloride 0.9 % 100 mL IVPB  Status:  Discontinued        2 g 200 mL/hr over 30 Minutes Intravenous Every 12 hours 07/20/23 1451 07/21/23 1108   07/19/23 2200  ceFEPIme (MAXIPIME) 1 g in sodium chloride 0.9 % 100 mL IVPB  Status:  Discontinued        1 g 200 mL/hr over 30 Minutes Intravenous Every 24 hours 07/19/23 1409 07/20/23 1451   07/19/23 2000  ceFEPIme (MAXIPIME) 2 g in sodium chloride 0.9 % 100 mL IVPB  Status:  Discontinued        2 g 200 mL/hr over 30 Minutes Intravenous Every M-W-F 07/19/23 0110 07/19/23 1409   07/19/23 0200  ceFEPIme (MAXIPIME) 2 g in sodium chloride 0.9 % 100 mL IVPB        2 g 200 mL/hr over 30 Minutes Intravenous  Once 07/19/23 0110 07/19/23 0221   07/18/23 1015  vancomycin (VANCOREADY) IVPB 750 mg/150 mL        750 mg 150 mL/hr over 60 Minutes Intravenous  Once 07/18/23 0929 07/19/23 1137   07/16/23 0000  ceFAZolin (ANCEF) IVPB 2g/100 mL premix        2 g 200 mL/hr over 30 Minutes Intravenous To Radiology 07/15/23 1408 07/16/23 1250   07/14/23 1200  vancomycin (VANCOREADY) IVPB 750 mg/150 mL  Status:  Discontinued        750 mg 150 mL/hr over 60 Minutes Intravenous Every M-W-F (Hemodialysis) 07/12/23 1203 07/19/23 1106   07/11/23 2330  vancomycin (VANCOREADY) IVPB 750 mg/150 mL        750 mg 150 mL/hr over 60 Minutes Intravenous  Once 07/11/23 2030 07/12/23 0110   07/11/23 1800  vancomycin (VANCOREADY) IVPB 750 mg/150 mL  Status:  Discontinued        750 mg 150 mL/hr over 60 Minutes Intravenous  Once 07/11/23 0856 07/11/23 1810  07/10/23 2318  vancomycin variable dose per unstable renal function (pharmacist dosing)  Status:  Discontinued         Does not apply See admin instructions 07/10/23 2319 07/12/23 1203   07/10/23 1429  vancomycin (VANCOREADY) IVPB 750 mg/150 mL  Status:  Discontinued        750 mg 150 mL/hr over 60 Minutes Intravenous Every Dialysis 07/10/23 1429 07/11/23 2030   07/10/23 1230  fidaxomicin (DIFICID) tablet 200 mg   Status:  Discontinued        200 mg Oral 2 times daily 07/10/23 1139 07/10/23 1207   07/10/23 1000  piperacillin-tazobactam (ZOSYN) IVPB 2.25 g  Status:  Discontinued        2.25 g 100 mL/hr over 30 Minutes Intravenous Every 8 hours 07/10/23 0908 07/10/23 1350   07/09/23 2330  vancomycin (VANCOREADY) IVPB 1750 mg/350 mL        1,750 mg 175 mL/hr over 120 Minutes Intravenous  Once 07/09/23 2329 07/10/23 0734   07/09/23 2330  piperacillin-tazobactam (ZOSYN) IVPB 3.375 g        3.375 g 100 mL/hr over 30 Minutes Intravenous  Once 07/09/23 2329 07/10/23 0240        DVT prophylaxis: Heparin  Code Status: Full code  Family Communication: No family at bedside   CONSULTS PCCM, nephrology, ID, CT surgery   Subjective   Denies any complaints.  Communicating well today.   Objective    Physical Examination:  Appears in no acute distress S1-S2, regular, no murmur auscultated Lungs clear to auscultation bilaterally Abdomen is soft, nontender, no organomegaly Extremities-[name]/p left AKA   Status is: Inpatient:      Pressure Injury 08/03/23 Buttocks Right Stage 2 -  Partial thickness loss of dermis presenting as a shallow open injury with a red, pink wound bed without slough. Several small open areas, right buttocks area (Active)  08/03/23 1945  Location: Buttocks  Location Orientation: Right  Staging: Stage 2 -  Partial thickness loss of dermis presenting as a shallow open injury with a red, pink wound bed without slough.  Wound Description (Comments): Several small open areas, right buttocks area  Present on Admission:         [name] [name]   [facility] If 7PM-7AM, please contact night-coverage at [email or web address], Office  [phone]   08/06/2023, 7:18 AM  LOS: 27 days

## 2023-08-07 DIAGNOSIS — I69391 Dysphagia following cerebral infarction: Secondary | ICD-10-CM | POA: Diagnosis not present

## 2023-08-07 DIAGNOSIS — R4182 Altered mental status, unspecified: Secondary | ICD-10-CM | POA: Diagnosis not present

## 2023-08-07 DIAGNOSIS — I059 Rheumatic mitral valve disease, unspecified: Secondary | ICD-10-CM | POA: Diagnosis not present

## 2023-08-07 DIAGNOSIS — J189 Pneumonia, unspecified organism: Secondary | ICD-10-CM | POA: Diagnosis not present

## 2023-08-07 LAB — CBC
HCT: 25.7 % — ABNORMAL LOW (ref 39.0–52.0)
Hemoglobin: 7.9 g/dL — ABNORMAL LOW (ref 13.0–17.0)
MCH: 29.9 pg (ref 26.0–34.0)
MCHC: 30.7 g/dL (ref 30.0–36.0)
MCV: 97.3 fL (ref 80.0–100.0)
Platelets: 302 10*3/uL (ref 150–400)
RBC: 2.64 MIL/uL — ABNORMAL LOW (ref 4.22–5.81)
RDW: 17.9 % — ABNORMAL HIGH (ref 11.5–15.5)
WBC: 13.4 10*3/uL — ABNORMAL HIGH (ref 4.0–10.5)
nRBC: 0 % (ref 0.0–0.2)

## 2023-08-07 LAB — MAGNESIUM: Magnesium: 2.4 mg/dL (ref 1.7–2.4)

## 2023-08-07 LAB — HEPARIN LEVEL (UNFRACTIONATED)
Heparin Unfractionated: 0.43 [IU]/mL (ref 0.30–0.70)
Heparin Unfractionated: 0.45 [IU]/mL (ref 0.30–0.70)

## 2023-08-07 LAB — GLUCOSE, CAPILLARY
Glucose-Capillary: 125 mg/dL — ABNORMAL HIGH (ref 70–99)
Glucose-Capillary: 127 mg/dL — ABNORMAL HIGH (ref 70–99)
Glucose-Capillary: 197 mg/dL — ABNORMAL HIGH (ref 70–99)
Glucose-Capillary: 216 mg/dL — ABNORMAL HIGH (ref 70–99)
Glucose-Capillary: 217 mg/dL — ABNORMAL HIGH (ref 70–99)
Glucose-Capillary: 220 mg/dL — ABNORMAL HIGH (ref 70–99)

## 2023-08-07 MED ORDER — INSULIN GLARGINE-YFGN 100 UNIT/ML ~~LOC~~ SOLN
8.0000 [IU] | Freq: Two times a day (BID) | SUBCUTANEOUS | Status: DC
  Administered 2023-08-07 – 2023-08-08 (×2): 8 [IU] via SUBCUTANEOUS
  Filled 2023-08-07 (×3): qty 0.08

## 2023-08-07 MED ORDER — ACETAMINOPHEN 160 MG/5ML PO SOLN
650.0000 mg | Freq: Four times a day (QID) | ORAL | Status: DC | PRN
  Administered 2023-08-07 – 2023-08-11 (×5): 650 mg
  Filled 2023-08-07 (×5): qty 20.3

## 2023-08-07 MED ORDER — MIDODRINE HCL 5 MG PO TABS: 10.0000 mg | ORAL_TABLET | Freq: Three times a day (TID) | ORAL | Status: DC

## 2023-08-07 MED ORDER — MIDODRINE HCL 5 MG PO TABS
5.0000 mg | ORAL_TABLET | Freq: Three times a day (TID) | ORAL | Status: DC
  Administered 2023-08-07 – 2023-08-11 (×10): 5 mg
  Filled 2023-08-07 (×10): qty 1

## 2023-08-07 MED ORDER — METOPROLOL TARTRATE 12.5 MG HALF TABLET
12.5000 mg | ORAL_TABLET | Freq: Two times a day (BID) | ORAL | Status: DC
  Administered 2023-08-07 – 2023-08-11 (×8): 12.5 mg via ORAL
  Filled 2023-08-07 (×8): qty 1

## 2023-08-07 NOTE — Progress Notes (Signed)
[facility]  PROGRESS NOTE  NICKLES BLACKLEDGE YQI:[id number] DOB: 20-Sep-1981 DOA: 07/09/2023 PCP: [name], NP   Brief HPI:    42 yo M w/ pertinent PMH ESRD on HD MWF, IDDM, gastroparesis, left AKA, HTN presents to [facility] on 10/11 w/ AMS.   Patient recently admitted to [facility] on 7/12 sepsis from right diabetic foot ulcer. Cultures w/ MRSA. Patient had dialysis catheter removed and replaced. ID treated w/ vanc. Patient refused echo and left on 7/17 against medical advise.    On 10/11 patient was found by family member on ground altered. EMS arrived and dialysis catheter partially removed. Patient more alert for them. Has been complaining of diarrhea x1 week per family. Unsure if patient went to dialysis today. Transferred to [facility]. On arrival bp stable 107/70 and afebrile. Patient remains confused and occasionally with agitation. Given dilaudid. CBG 497. Beta H >8. VBG ph 7.06, 16, 54, 4.7. Given iv fluids and started on insulin drip for DKA. CT head negative for acute abnormality. Cxr no significant findings. CT abd/pelvis no acute findings; possible steatosis vs. Liver mass 7 x 4 cm recommending f/u w/ MRI. WBC 20.7 and LA 2.4. Sepsis protocol initiated. Cultures obtained and started on vanc/zosyn. Given ams, dka, and soft bp pccm consulted for icu admission.   10/21 pccm called overnight to assess for worsening hypoxia and AMS after vomiting. On NRB w/ sats 70-80s. Hypoglycemic given dextrose which improved his mental status some. Transferred to 2H icu for intubation.   Patient was extubated, transferred to progressive care unit under [facility] service on 08/04/2023  10/12: altered mental status, DKA 10/12: blood cultures positive for MRSA 10/13 TTE LVEF 25-30% w/ severely depressed LVF, global hypokinesis, gd I diastolic dysfxn, mod RV fxn reduction. Possible filamentous structure on MV 10/16 TEE LVEF 30-35%RV fxn nml. Large mass (2.00 cm x 0.88 cm ) on the posterior mitral leaflet which  appeared to be extending in the mitral annulus. There are two mobile masses visible on the anterior leaflet of the tricupid valve which also appears to have a perforation. Another mobile  mass ( 0.68 cm x 0.67 cm) on the posterior tricupid leaflet. Moderate Tricuspid regurgitation which appears to  be through the leaflet perforation  10/16 seen by Cardiothoracic surg. Felt poor candidate. Recommended 6 weeks abx and recommended repeat TEE 1 week 10/17 noting left arm weakness. Not clear when started.  MRI showed Right MCA infarct w/ acute infarcts in left occipital and bilateral cerebellum felt c/w septic emboli. Neuro consulted.  10/18 tunneled left femoral vasc cath placed after catheter holiday  10/20 palliative care consulted later that day worsening hypoxia. Sats 70s. After vomiting. More lethargic  10/21: resp failure requiring intubation shortly after midnight  10/22- TEE 10/22 with EF 35%, LV global hypokinesis. Very large vegetation lateral scallop of posterior mitral leaflet 3.9cm length 1 cm width, highly mobile, prolapsing into LV. No large vegetation seen on TV.  Later that afternoon and abrupt hemodynamic changes requiring escalation of vasopressor support.  Felt given the size of his vegetation and increased bulk of the vegetation infections unlikely to clear.  He was felt to be decompensation may have due to of breath leaflet perforation and acute mitral valve regurgb 10/23: per nursing less responsive than yesterday, increased ETT secretions. Clotting off CRRT. Ct head showing expected evolution of the right MCA stroke.  Cardiothoracic surgery re-consulted for worsening MVE. Felt too high risk here. Recommended referral to tertiary care  10/24 agitated when  sedation decreased, is purposeful but does not follow commands. Tolerating UF gaols of -150. Still on NE infusion but extubated later that afternoon after passing SBT 10/25 failed swallow eval still on just 3 lpm O2. Encephalopathic.  Added seroquel in effort to get him off the precedex. Cardiology told no beds at [facility]. Not clear if accepted or not. Worsening pressor requirements. Got blood for hgb 6. Cefepime resumed for clinical decline and leukocytosis  10/26 Hemoglobin again dropped to 6.3 overnight with increased pressor requirement yesterday afternoon now on vaso and levo. 10/27 Hypoglycemic episode overnight, mental status improved a little over weekend  10/28 Getting another unit of blood more awake.  Oriented x 2.  Still on Precedex.  Having liquid stools. 10/30 Off Precedex, low-dose levo.  Plan to transition to IHD 10/29 Cefepime for aspiration course completed.  10/31 Seen by overnight team for hypoxia and somnolence ABG with mild hypercapnia. Again declined by [facility] because they were at capacity. More awake. Doing better w/ Bed side swallow but still likely aspirating. CRRT stopped.  11/1 Adjustments made for better glycemic control.  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 - Low dose metoprolol  held in the setting of hypotension, consider slow resumption - Definitive management is valve repair/replacement - Cardiology/TCTS consulted, recommend transfer to tertiary care center due to high-risk patient/procedure - [facility] has denied transfer x 2 (?capacity), [facility] denied for multiple prohibitive surgical risks -Again called [facility] for transfer on 08/05/2023, declined at this time due to capacity.   Ongoing sinus tachycardia, ?r/t LUE DVT Heart rate remains 130s to 140s. - Cardiac monitoring - Optimize electrolytes for K > 4, Mg > 2 -Dose of midodrine  has been changed to 5 mg 3 times daily today.  Will observe today and consider starting low-dose metoprolol in a.m. if blood pressure remains elevated   LUE DVT Dopplers 11/4 positive for L IJ, L [place], L axillary veins; septic versus VTE. 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 - Delirium precautions - Klonopin PRN - Continue Seroquel - PT/OT/SLP as able to participate in care   End-stage renal disease on hemodialysis (MWF at baseline) Has occluded bilateral  veins  Mild hyperkalemia off CRRT - Nephrology following, appreciate assistance - Successfully transitioned to [facility], next 11/6 - Trend BMP - Replete electrolytes as indicated - Monitor I&Os - Avoid nephrotoxic agents as able - Ensure adequate renal perfusion  Dysphagia - TF via Cortrak -Speech therapy worked with patient today, was able to swallow food better today -Started on dysphagia 1 diet with nectar thick liquid -Will see how he does with diet over next few days -Might need long-term PEG tube placement  T2DM w/ hyperglycemia and hypoglycemia, brittle - Basal Semglee 5U daily - SSI - CBGs Q4H - Goal CBG 140-180    Anemia and thrombocytopenia critical illness S/p 5 units total PRBC. - Trend H&H - Monitor for signs of active bleeding - Transfuse for Hgb < 7.0 or hemodynamically significant bleeding - 1U PRBCs given 11/5AM for Hgb 6.8.   Incidental finding of Liver steatosis vs. liver mass CT A/P showing 7 x 4 cm density in anterior liver. - Outpatient surveillance/GI follow up    Medications     calcitRIOL  1.5 mcg Per Tube Q M,W,F   Chlorhexidine Gluconate Cloth  6 each Topical Q0600   darbepoetin (ARANESP) injection - DIALYSIS  40 mcg Subcutaneous Q Fri-1800   feeding supplement (PROSource TF20)  60 mL Per Tube Daily   fiber supplement (BANATROL TF)  60 mL Per Tube BID   gabapentin  300 mg Per Tube QHS   insulin aspart  0-9 Units Subcutaneous Q4H   insulin glargine-yfgn  8 Units Subcutaneous BID   lidocaine  1 Application Topical Once   midodrine  5 mg Per Tube Q8H   multivitamin  1 tablet Per Tube QHS   nutrition supplement (JUVEN)  1 packet Per Tube BID BM   mouth rinse  15 mL Mouth Rinse 4 times per day   QUEtiapine  50 mg Per Tube QHS   QUEtiapine  50 mg Per Tube Q0600     Data Reviewed:   CBG:  Recent Labs  Lab 08/06/23 1105 08/06/23 2012 08/06/23 2353 08/07/23 0409 08/07/23 0810  GLUCAP 243* 206* 183* 220* 217*    SpO2: 93 % O2 Flow Rate (L/min): 2 L/min FiO2 (%): 32 %    Vitals:   08/06/23 2357 08/07/23 0000 08/07/23 0411 08/07/23 0820  BP: (!) 187/83 (!) 161/89 (!) 140/96 (!) 143/95  Pulse: (!) 125 (!) 123 (!) 122 (!) 113  Resp: 14 16 17 15   Temp: (!) 100.4 F (38 C)  97.9 F (36.6 C) 98.7 F (37.1 C)  TempSrc: Oral  Oral Oral  SpO2: 94% 94% 94% 93%  Weight:   79.8 kg   Height:          Data Reviewed:  Basic Metabolic Panel: Recent Labs  Lab 08/01/23 0331 08/02/23 0410 08/03/23 0324 08/04/23 0406 08/05/23 0508 08/06/23 0614 08/07/23 0540  NA 133* 134* 133* 132* 130*  --   --   K 5.5* 5.5* 4.3 4.5 3.6  --   --   CL 92* 90* 93* 92* 90*   --   --   CO2 28 29 28 25 25   --   --   GLUCOSE 144* 234* 201* 158* 234*  --   --   BUN 80* 108* 62* 107* 45*  --   --   CREATININE 4.54* 6.02* 3.82* 5.23* 3.71*  --   --   CALCIUM 8.8* 9.2 9.0 9.2 8.7*  --   --   MG 2.4 2.7* 2.0 2.3 2.1 2.5* 2.4  PHOS 5.5* 6.0* 3.5 4.6 4.2  --   --     CBC: Recent Labs  Lab 08/03/23 0324 08/04/23 0406 08/05/23 0508 08/06/23 0740 08/07/23 0540  WBC 17.5* 16.9* 14.2* 16.0* 13.4*  HGB 6.8* 8.1* 7.9* 8.0* 7.9*  HCT 22.1* 26.8* 25.3* 26.3* 25.7*  MCV 97.8 94.7 96.2 96.3 97.3  PLT 324 362 331 355 302    LFT No results for input(s): "AST", "ALT", "ALKPHOS", "BILITOT", "PROT", "ALBUMIN" in the last 168 hours.   Antibiotics: Anti-infectives (From admission, onward)    Start     Dose/Rate Route Frequency Ordered Stop   08/02/23 1200  vancomycin (VANCOREADY) IVPB 750 mg/150 mL        750 mg 150 mL/hr over 60 Minutes Intravenous Every M-W-F (Hemodialysis) 07/31/23 1404     07/30/23 1800  vancomycin (VANCOREADY) IVPB 750 mg/150 mL        750 mg 150 mL/hr over 60 Minutes Intravenous Every M-W-F (Hemodialysis) 07/30/23 1358 07/30/23 1923   07/29/23 0812  vancomycin variable dose per unstable renal function (pharmacist dosing)  Status:  Discontinued         Does not apply See admin instructions 07/29/23 0813 07/31/23 1404   07/28/23 1000  vancomycin (VANCOREADY) IVPB 750 mg/150 mL  Status:  Discontinued        750 mg 150 mL/hr over 60 Minutes Intravenous Every 24 hours 07/27/23 1539 07/29/23 0813   07/27/23 1630  vancomycin (VANCOREADY) IVPB 500 mg/100 mL        500 mg 100 mL/hr over 60 Minutes Intravenous  Once 07/27/23 1539 07/27/23 1759   07/23/23 1945  ceFEPIme (MAXIPIME) 2 g in sodium chloride 0.9 % 100 mL IVPB  Status:  Discontinued        2 g 200 mL/hr over 30 Minutes Intravenous Every 12 hours 07/23/23 1856 07/27/23 1053   07/21/23 1200  vancomycin (VANCOREADY) IVPB 750 mg/150 mL  Status:  Discontinued        750 mg 150 mL/hr over 60  Minutes Intravenous Every M-W-F (Hemodialysis) 07/19/23 1106 07/20/23 0842   07/21/23 0230  vancomycin (VANCOREADY) IVPB 750 mg/150 mL  Status:  Discontinued        750 mg 150 mL/hr over 60 Minutes Intravenous Every 24 hours 07/20/23 1451 07/27/23 1314   07/20/23 2200  ceFEPIme (MAXIPIME) 2 g in sodium chloride 0.9 % 100 mL IVPB  Status:  Discontinued        2 g 200 mL/hr over 30 Minutes Intravenous Every 12 hours 07/20/23 1451 07/21/23 1108   07/19/23 2200  ceFEPIme (MAXIPIME) 1 g in sodium chloride 0.9 % 100 mL IVPB  Status:  Discontinued        1 g 200 mL/hr over 30 Minutes Intravenous Every 24 hours 07/19/23 1409 07/20/23 1451   07/19/23 2000  ceFEPIme (MAXIPIME) 2 g in sodium chloride 0.9 % 100 mL IVPB  Status:  Discontinued        2 g 200 mL/hr over 30 Minutes Intravenous Every M-W-F 07/19/23 0110 07/19/23 1409   07/19/23 0200  ceFEPIme (MAXIPIME) 2 g in sodium chloride 0.9 % 100 mL IVPB        2 g 200 mL/hr over 30 Minutes Intravenous  Once 07/19/23 0110 07/19/23 0221   07/18/23 1015  vancomycin (VANCOREADY) IVPB 750 mg/150 mL        750 mg 150 mL/hr over 60 Minutes Intravenous  Once 07/18/23 0929 07/19/23 1137   07/16/23 0000  ceFAZolin (ANCEF) IVPB 2g/100 mL premix        2 g 200 mL/hr over 30 Minutes Intravenous To Radiology 07/15/23 1408 07/16/23 1250   07/14/23 1200  vancomycin (VANCOREADY) IVPB 750 mg/150 mL  Status:  Discontinued  750 mg 150 mL/hr over 60 Minutes Intravenous Every M-W-F (Hemodialysis) 07/12/23 1203 07/19/23 1106   07/11/23 2330  vancomycin (VANCOREADY) IVPB 750 mg/150 mL        750 mg 150 mL/hr over 60 Minutes Intravenous  Once 07/11/23 2030 07/12/23 0110   07/11/23 1800  vancomycin (VANCOREADY) IVPB 750 mg/150 mL  Status:  Discontinued        750 mg 150 mL/hr over 60 Minutes Intravenous  Once 07/11/23 0856 07/11/23 1810   07/10/23 2318  vancomycin variable dose per unstable renal function (pharmacist dosing)  Status:  Discontinued         Does not  apply See admin instructions 07/10/23 2319 07/12/23 1203   07/10/23 1429  vancomycin (VANCOREADY) IVPB 750 mg/150 mL  Status:  Discontinued        750 mg 150 mL/hr over 60 Minutes Intravenous Every Dialysis 07/10/23 1429 07/11/23 2030   07/10/23 1230  fidaxomicin (DIFICID) tablet 200 mg  Status:  Discontinued        200 mg Oral 2 times daily 07/10/23 1139 07/10/23 1207   07/10/23 1000  piperacillin-tazobactam (ZOSYN) IVPB 2.25 g  Status:  Discontinued        2.25 g 100 mL/hr over 30 Minutes Intravenous Every 8 hours 07/10/23 0908 07/10/23 1350   07/09/23 2330  vancomycin (VANCOREADY) IVPB 1750 mg/350 mL        1,750 mg 175 mL/hr over 120 Minutes Intravenous  Once 07/09/23 2329 07/10/23 0734   07/09/23 2330  piperacillin-tazobactam (ZOSYN) IVPB 3.375 g        3.375 g 100 mL/hr over 30 Minutes Intravenous  Once 07/09/23 2329 07/10/23 0240        DVT prophylaxis: Heparin  Code Status: Full code  Family Communication: Discussed with patient's mother on phone   CONSULTS PCCM, nephrology, ID, CT surgery   Subjective   Denies any complaints.  Heart rate: 110s to 120s.  Started on dysphagia 1 diet  Objective    Physical Examination:  Appears in no acute distress S1-S2, regular Lungs clear to auscultation bilaterally Abdomen is soft, nontender, no organomegaly Neuro-alert, able to.  Conversation, following commands   Status is: Inpatient:      Pressure Injury 08/03/23 Buttocks Left Stage 2 -  Partial thickness loss of dermis presenting as a shallow open injury with a red, pink wound bed without slough. Several small open areas, right buttocks area (Active)  08/03/23 1945  Location: Buttocks  Location Orientation: Left  Staging: Stage 2 -  Partial thickness loss of dermis presenting as a shallow open injury with a red, pink wound bed without slough.  Wound Description (Comments): Several small open areas, right buttocks area  Present on Admission:         Yehonatan Grandison S  Alaine Loughney   [facility] If 7PM-7AM, please contact night-coverage at [email or web address], Office  [phone]   08/07/2023, 11:24 AM  LOS: 28 days

## 2023-08-07 NOTE — Progress Notes (Signed)
Patient ID: [name], male   DOB: 07/28/1981, 42 y.o.   MRN: [id number] [facility] Progress Note   Assessment/ Plan:   1.  MRSA bacteremia with mitral valve endocarditis: Suspected to be related to catheter related bacteremia and is status post line holiday with new left femoral TDC placed by IR on 10/18 (has occlusion of bilateral subclavian veins limiting his options for dialysis access).  On intravenous vancomycin and attempting transfer to tertiary center for MV intervention ([facility] at capacity based on conversation with Dr. Sharl Ma earlier this week). 2.  Acute ischemic CVA: Secondary to septic infarct on 10/17, appears to have had some neurological impact and likely to affect disposition at discharge. 3. ESRD: Usually on a Monday/Wednesday/Friday schedule and underwent dialysis yesterday without issues.  Plan for next hemodialysis again on Monday.  4. Anemia: Hemoglobin/hematocrit appear stable on the current ESA dose. 5. CKD-MBD: Calcium and phosphorus levels currently acceptable level, monitor on renal diet.  On calcitriol for PTH control. 6. Nutrition: On dysphagia 1 diet following swallow evaluation and ongoing tube feeds. 7. Hypotension: Weaned off of pressors and now weaning off of midodrine with elevated blood pressures.  Will decrease midodrine dose to 5 mg 3 times daily  Subjective:   No acute events overnight, after hemodialysis, remains marginally tachycardic   Objective:   BP (!) 140/96 (BP Location: Right Leg)   Pulse (!) 122   Temp 97.9 F (36.6 C) (Oral)   Resp 17   Ht 5\' 7"  (1.702 m)   Wt 79.8 kg   SpO2 94%   BMI 27.55 kg/m   Physical Exam: Gen: Awake/alert and comfortable resting in bed.  CVS: Regular tachycardia, S1 and S2 Resp: Transmitted breath sounds, no distinct rales or rhonchi.  Oxygen via nasal cannula Abd: Soft, flat, nontender, bowel sounds normal Ext: Status post left AKA, no edema right leg.  Left femoral TDC in  place  Labs: BMET Recent Labs  Lab 08/01/23 0331 08/02/23 0410 08/03/23 0324 08/04/23 0406 08/05/23 0508  NA 133* 134* 133* 132* 130*  K 5.5* 5.5* 4.3 4.5 3.6  CL 92* 90* 93* 92* 90*  CO2 28 29 28 25 25   GLUCOSE 144* 234* 201* 158* 234*  BUN 80* 108* 62* 107* 45*  CREATININE 4.54* 6.02* 3.82* 5.23* 3.71*  CALCIUM 8.8* 9.2 9.0 9.2 8.7*  PHOS 5.5* 6.0* 3.5 4.6 4.2   CBC Recent Labs  Lab 08/04/23 0406 08/05/23 0508 08/06/23 0740 08/07/23 0540  WBC 16.9* 14.2* 16.0* 13.4*  HGB 8.1* 7.9* 8.0* 7.9*  HCT 26.8* 25.3* 26.3* 25.7*  MCV 94.7 96.2 96.3 97.3  PLT 362 331 355 302    Medications:     calcitRIOL  1.5 mcg Per Tube Q M,W,F   Chlorhexidine Gluconate Cloth  6 each Topical Q0600   darbepoetin (ARANESP) injection - DIALYSIS  40 mcg Subcutaneous Q Fri-1800   feeding supplement (PROSource TF20)  60 mL Per Tube Daily   fiber supplement (BANATROL TF)  60 mL Per Tube BID   gabapentin  300 mg Per Tube QHS   insulin aspart  0-9 Units Subcutaneous Q4H   insulin glargine-yfgn  5 Units Subcutaneous BID   lidocaine  1 Application Topical Once   midodrine  10 mg Per Tube Q8H   multivitamin  1 tablet Per Tube QHS   nutrition supplement (JUVEN)  1 packet Per Tube BID BM   mouth rinse  15 mL Mouth Rinse 4 times per day   QUEtiapine  50 mg Per Tube QHS   QUEtiapine  50 mg Per Tube Q0600   Zetta Bills, MD 08/07/2023, 8:15 AM

## 2023-08-07 NOTE — Progress Notes (Signed)
PHARMACY - ANTICOAGULATION CONSULT NOTE  Pharmacy Consult for heparin Indication: DVT  No Known Allergies  Patient Measurements: Height: 5\' 7"  (170.2 cm) Weight: 79.8 kg (175 lb 14.8 oz) IBW/kg (Calculated) : 66.1 Heparin Dosing Weight: 75 kg  Vital Signs: Temp: 98.7 F (37.1 C) (11/09 0820) Temp Source: Oral (11/09 0820) BP: 143/95 (11/09 0820) Pulse Rate: 113 (11/09 0820)  Labs: Recent Labs    08/05/23 0508 08/05/23 0731 08/06/23 0740 08/06/23 0745 08/06/23 2330 08/07/23 0540 08/07/23 0808  HGB 7.9*  --  8.0*  --   --  7.9*  --   HCT 25.3*  --  26.3*  --   --  25.7*  --   PLT 331  --  355  --   --  302  --   HEPARINUNFRC  --    < >  --  0.24* 0.45  --  0.43  CREATININE 3.71*  --   --   --   --   --   --    < > = values in this interval not displayed.    Estimated Creatinine Clearance: 26 mL/min (A) (by C-G formula based on SCr of 3.71 mg/dL (H)).   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  Has required multiple blood transfusions (5 units total, last 11/5 for Hgb 6.8).  Patient is on HD. Pharmacy consulted to dose heparin.  No anticoagulation PTA.    Heparin level therapeutic at 0.45 >0.43 on 2600 units/hr.  No issues with infusion or bleeding per RN. Hgb stable.   Goal of Therapy:  Heparin level 0.3-0.7 units/ml Monitor platelets by anticoagulation protocol: Yes   Plan:  Continue heparin at 2600 units/hour  Monitor daily heparin level, CBC, signs/symptoms of bleeding   Wilmer Floor, PharmD PGY2 Cardiology Pharmacy Resident  08/07/2023 10:03 AM

## 2023-08-07 NOTE — Progress Notes (Signed)
PHARMACY - ANTICOAGULATION CONSULT NOTE  Pharmacy Consult for heparin Indication: DVT  No Known Allergies  Patient Measurements: Height: 5\' 7"  (170.2 cm) Weight: 83.4 kg (183 lb 13.8 oz) IBW/kg (Calculated) : 66.1 Heparin Dosing Weight: 75 kg  Vital Signs: Temp: 100.4 F (38 C) (11/08 2357) Temp Source: Oral (11/08 2357) BP: 187/83 (11/08 2357) Pulse Rate: 125 (11/08 2357)  Labs: Recent Labs    08/04/23 0406 08/04/23 1310 08/05/23 0508 08/05/23 0731 08/05/23 1701 08/06/23 0740 08/06/23 0745 08/06/23 2330  HGB 8.1*  --  7.9*  --   --  8.0*  --   --   HCT 26.8*  --  25.3*  --   --  26.3*  --   --   PLT 362  --  331  --   --  355  --   --   HEPARINUNFRC 0.19*   < >  --    < > 0.35  --  0.24* 0.45  CREATININE 5.23*  --  3.71*  --   --   --   --   --    < > = values in this interval not displayed.    Estimated Creatinine Clearance: 26.5 mL/min (A) (by C-G formula based on SCr of 3.71 mg/dL (H)).   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  Has required multiple blood transfusions (5 units total, last 11/5 for Hgb 6.8).  Patient is on HD. Pharmacy consulted to dose heparin.  No anticoagulation PTA.    11/9: Heparin level therapeutic at 0.45 on 2600 units/hr.  No issues with infusion or bleeding per RN. Hgb stable.   Goal of Therapy:  Heparin level 0.3-0.7 units/ml Monitor platelets by anticoagulation protocol: Yes   Plan:  Continue heparin at 2600 units/hour  8h heparin level Monitor daily heparin level, CBC, signs/symptoms of bleeding   Arabella Merles, PharmD. Clinical Pharmacist 08/07/2023 12:06 AM

## 2023-08-08 DIAGNOSIS — I059 Rheumatic mitral valve disease, unspecified: Secondary | ICD-10-CM | POA: Diagnosis not present

## 2023-08-08 DIAGNOSIS — R570 Cardiogenic shock: Secondary | ICD-10-CM | POA: Diagnosis not present

## 2023-08-08 DIAGNOSIS — I63311 Cerebral infarction due to thrombosis of right middle cerebral artery: Secondary | ICD-10-CM

## 2023-08-08 DIAGNOSIS — E1010 Type 1 diabetes mellitus with ketoacidosis without coma: Secondary | ICD-10-CM | POA: Diagnosis not present

## 2023-08-08 LAB — CBC
HCT: 26.3 % — ABNORMAL LOW (ref 39.0–52.0)
Hemoglobin: 7.8 g/dL — ABNORMAL LOW (ref 13.0–17.0)
MCH: 28.9 pg (ref 26.0–34.0)
MCHC: 29.7 g/dL — ABNORMAL LOW (ref 30.0–36.0)
MCV: 97.4 fL (ref 80.0–100.0)
Platelets: 271 10*3/uL (ref 150–400)
RBC: 2.7 MIL/uL — ABNORMAL LOW (ref 4.22–5.81)
RDW: 17.7 % — ABNORMAL HIGH (ref 11.5–15.5)
WBC: 22.3 10*3/uL — ABNORMAL HIGH (ref 4.0–10.5)
nRBC: 0 % (ref 0.0–0.2)

## 2023-08-08 LAB — GLUCOSE, CAPILLARY
Glucose-Capillary: 286 mg/dL — ABNORMAL HIGH (ref 70–99)
Glucose-Capillary: 304 mg/dL — ABNORMAL HIGH (ref 70–99)
Glucose-Capillary: 307 mg/dL — ABNORMAL HIGH (ref 70–99)
Glucose-Capillary: 460 mg/dL — ABNORMAL HIGH (ref 70–99)
Glucose-Capillary: 495 mg/dL — ABNORMAL HIGH (ref 70–99)
Glucose-Capillary: 292 mg/dL — ABNORMAL HIGH (ref 70–99)

## 2023-08-08 LAB — MAGNESIUM: Magnesium: 2.8 mg/dL — ABNORMAL HIGH (ref 1.7–2.4)

## 2023-08-08 LAB — HEPARIN LEVEL (UNFRACTIONATED): Heparin Unfractionated: 0.63 [IU]/mL (ref 0.30–0.70)

## 2023-08-08 MED ORDER — INSULIN ASPART 100 UNIT/ML IJ SOLN
0.0000 [IU] | Freq: Three times a day (TID) | INTRAMUSCULAR | Status: DC
  Administered 2023-08-09: 2 [IU] via SUBCUTANEOUS
  Administered 2023-08-10: 1 [IU] via SUBCUTANEOUS
  Administered 2023-08-10: 5 [IU] via SUBCUTANEOUS
  Administered 2023-08-10: 2 [IU] via SUBCUTANEOUS
  Administered 2023-08-11: 5 [IU] via SUBCUTANEOUS
  Administered 2023-08-11: 2 [IU] via SUBCUTANEOUS
  Administered 2023-08-12: 7 [IU] via SUBCUTANEOUS
  Administered 2023-08-12 (×2): 3 [IU] via SUBCUTANEOUS
  Administered 2023-08-13: 7 [IU] via SUBCUTANEOUS
  Administered 2023-08-13: 5 [IU] via SUBCUTANEOUS
  Administered 2023-08-14: 7 [IU] via SUBCUTANEOUS
  Administered 2023-08-14: 5 [IU] via SUBCUTANEOUS
  Administered 2023-08-15: 1 [IU] via SUBCUTANEOUS
  Administered 2023-08-15: 3 [IU] via SUBCUTANEOUS
  Administered 2023-08-15 – 2023-08-16 (×3): 5 [IU] via SUBCUTANEOUS
  Administered 2023-08-17 (×2): 2 [IU] via SUBCUTANEOUS
  Administered 2023-08-18: 9 [IU] via SUBCUTANEOUS

## 2023-08-08 MED ORDER — DARBEPOETIN ALFA 100 MCG/0.5ML IJ SOSY
100.0000 ug | PREFILLED_SYRINGE | INTRAMUSCULAR | Status: DC
  Administered 2023-08-13: 100 ug via SUBCUTANEOUS
  Filled 2023-08-08 (×3): qty 0.5

## 2023-08-08 MED ORDER — NEPRO/CARBSTEADY PO LIQD
1000.0000 mL | ORAL | Status: DC
  Administered 2023-08-08: 1000 mL
  Filled 2023-08-08: qty 1000

## 2023-08-08 MED ORDER — INSULIN ASPART 100 UNIT/ML IJ SOLN
0.0000 [IU] | Freq: Every day | INTRAMUSCULAR | Status: DC
  Administered 2023-08-11: 3 [IU] via SUBCUTANEOUS
  Administered 2023-08-14: 4 [IU] via SUBCUTANEOUS

## 2023-08-08 MED ORDER — INSULIN ASPART 100 UNIT/ML IJ SOLN
0.0000 [IU] | Freq: Three times a day (TID) | INTRAMUSCULAR | Status: DC
  Administered 2023-08-08: 15 [IU] via SUBCUTANEOUS
  Administered 2023-08-08: 11 [IU] via SUBCUTANEOUS

## 2023-08-08 MED ORDER — INSULIN ASPART 100 UNIT/ML IJ SOLN: 10.0000 [IU] | Freq: Three times a day (TID) | INTRAMUSCULAR | Status: DC

## 2023-08-08 MED ORDER — INSULIN ASPART 100 UNIT/ML IJ SOLN
10.0000 [IU] | Freq: Once | INTRAMUSCULAR | Status: AC
  Administered 2023-08-08: 10 [IU] via SUBCUTANEOUS

## 2023-08-08 MED ORDER — INSULIN GLARGINE-YFGN 100 UNIT/ML ~~LOC~~ SOLN
10.0000 [IU] | Freq: Two times a day (BID) | SUBCUTANEOUS | Status: DC
  Administered 2023-08-08 – 2023-08-10 (×4): 10 [IU] via SUBCUTANEOUS
  Filled 2023-08-08 (×6): qty 0.1

## 2023-08-08 NOTE — Progress Notes (Signed)
Pateint's sugar was checked after he had eaten his dinner, resulting at 460---per Dr Sharl Ma, give sliding scale coverage for cbg 400 and reduce feed rate to 30 ml/hr

## 2023-08-08 NOTE — Plan of Care (Signed)

## 2023-08-08 NOTE — Plan of Care (Signed)
  Problem: Clinical Measurements: Goal: Ability to maintain clinical measurements within normal limits will improve 08/08/2023 0551 by Lennice Sites, RN Outcome: Progressing 08/08/2023 0131 by Lennice Sites, RN Outcome: Progressing Goal: Will remain free from infection 08/08/2023 0551 by Lennice Sites, RN Outcome: Progressing 08/08/2023 0131 by Lennice Sites, RN Outcome: Progressing Goal: Diagnostic test results will improve 08/08/2023 0551 by Lennice Sites, RN Outcome: Progressing 08/08/2023 0131 by Lennice Sites, RN Outcome: Progressing Goal: Respiratory complications will improve Outcome: Progressing Goal: Cardiovascular complication will be avoided Outcome: Progressing

## 2023-08-08 NOTE — Progress Notes (Signed)
[name] requested patient be placed on bed like he had in ICU---I have explained that this unit cannot interchange with any beds from ICU, and I'm not sure exactly what bed we could get for him, I have talked with chg nurse/Katie and she is Chief Strategy Officer

## 2023-08-08 NOTE — Progress Notes (Signed)
Patient refused to have dressing changed on right femoral triple lumen access

## 2023-08-08 NOTE — Progress Notes (Signed)
PHARMACY - ANTICOAGULATION CONSULT NOTE  Pharmacy Consult for heparin Indication: DVT  No Known Allergies  Patient Measurements: Height: 5\' 7"  (170.2 cm) Weight: 80.8 kg (178 lb 2.1 oz) IBW/kg (Calculated) : 66.1 Heparin Dosing Weight: 75 kg  Vital Signs: Temp: 98.5 F (36.9 C) (11/10 0345) Temp Source: Oral (11/10 0345) BP: 155/83 (11/10 0345) Pulse Rate: 119 (11/10 0345)  Labs: Recent Labs    08/06/23 0740 08/06/23 0745 08/06/23 2330 08/07/23 0540 08/07/23 0808 08/08/23 0430  HGB 8.0*  --   --  7.9*  --  7.8*  HCT 26.3*  --   --  25.7*  --  26.3*  PLT 355  --   --  302  --  271  HEPARINUNFRC  --    < > 0.45  --  0.43 0.63   < > = values in this interval not displayed.    Estimated Creatinine Clearance: 26.1 mL/min (A) (by C-G formula based on SCr of 3.71 mg/dL (H)).   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  Has required multiple blood transfusions (5 units total, last 11/5 for Hgb 6.8).  Patient is on HD. Pharmacy consulted to dose heparin.  No anticoagulation PTA.    Heparin level therapeutic at 0.63 on IV UFH at 2600 units/hr.  No issues with infusion or bleeding per RN. Hgb stable.   Goal of Therapy:  Heparin level 0.3-0.7 units/ml Monitor platelets by anticoagulation protocol: Yes   Plan:  Continue heparin at 2600 units/hour  Monitor daily heparin level, CBC, signs/symptoms of bleeding   Wilmer Floor, PharmD PGY2 Cardiology Pharmacy Resident  08/08/2023 6:26 AM

## 2023-08-08 NOTE — Progress Notes (Addendum)
Patient ID: ADMIRAL WIESER, male   DOB: 1981-06-26, 42 y.o.   MRN: [id number] [facility] Progress Note   Assessment/ Plan:   1.  MRSA bacteremia with mitral valve endocarditis: Suspected to be related to catheter related bacteremia and is status post line holiday with new left femoral TDC placed by IR on 10/18 (has occlusion of bilateral subclavian veins limiting his options for dialysis access).  Remains on intravenous vancomycin and options being explored for transfer to tertiary center for mitral valve repair. 2.  Acute ischemic CVA: Secondary to septic infarct on 10/17, appears to have had some neurological impact and likely to affect disposition at discharge. 3. ESRD: Usually on a Monday/Wednesday/Friday schedule and underwent dialysis yesterday without issues.  I will order for hemodialysis again tomorrow, overall disposition unclear.  Not suitable for outpatient hemodialysis at this time. 4. Anemia: Hemoglobin/hematocrit low but stable-dose increased for darbepoetin to 100 mcg q. weekly. 5. CKD-MBD: Calcium and phosphorus levels currently acceptable level, monitor on renal diet.  On calcitriol for PTH control. 6. Nutrition: On dysphagia 1 diet following swallow evaluation and ongoing tube feeds. 7. Hypotension: Weaned off of pressors and midodrine dose decreased to 5 mg 3 times a day. Subjective:   Behavioral problems noted overnight with patient name calling/spitting at staff.  We discussed this is unacceptable behavior and the need to rectify it.   Objective:   BP (!) 119/42 (BP Location: Right Leg)   Pulse (!) 115   Temp 98.6 F (37 C) (Oral)   Resp 15   Ht 5\' 7"  (1.702 m)   Wt 80.8 kg   SpO2 94%   BMI 27.90 kg/m   Physical Exam: Gen: Awake, alert, somewhat confused resting in bed.  Nurse at bedside CVS: Regular tachycardia, S1 and S2 Resp: Transmitted breath sounds, no distinct rales or rhonchi.  Oxygen via nasal cannula Abd: Soft, flat, nontender, bowel sounds  normal Ext: Status post left AKA, no edema right leg.  Left femoral TDC in place  Labs: BMET Recent Labs  Lab 08/02/23 0410 08/03/23 0324 08/04/23 0406 08/05/23 0508  NA 134* 133* 132* 130*  K 5.5* 4.3 4.5 3.6  CL 90* 93* 92* 90*  CO2 29 28 25 25   GLUCOSE 234* 201* 158* 234*  BUN 108* 62* 107* 45*  CREATININE 6.02* 3.82* 5.23* 3.71*  CALCIUM 9.2 9.0 9.2 8.7*  PHOS 6.0* 3.5 4.6 4.2   CBC Recent Labs  Lab 08/05/23 0508 08/06/23 0740 08/07/23 0540 08/08/23 0430  WBC 14.2* 16.0* 13.4* 22.3*  HGB 7.9* 8.0* 7.9* 7.8*  HCT 25.3* 26.3* 25.7* 26.3*  MCV 96.2 96.3 97.3 97.4  PLT 331 355 302 271    Medications:     calcitRIOL  1.5 mcg Per Tube Q M,W,F   Chlorhexidine Gluconate Cloth  6 each Topical Q0600   darbepoetin (ARANESP) injection - DIALYSIS  40 mcg Subcutaneous Q Fri-1800   feeding supplement (PROSource TF20)  60 mL Per Tube Daily   fiber supplement (BANATROL TF)  60 mL Per Tube BID   gabapentin  300 mg Per Tube QHS   insulin aspart  0-9 Units Subcutaneous Q4H   insulin glargine-yfgn  8 Units Subcutaneous BID   lidocaine  1 Application Topical Once   metoprolol tartrate  12.5 mg Oral BID   midodrine  5 mg Per Tube Q8H   multivitamin  1 tablet Per Tube QHS   nutrition supplement (JUVEN)  1 packet Per Tube BID BM   mouth rinse  15 mL Mouth Rinse 4 times per day   QUEtiapine  50 mg Per Tube QHS   QUEtiapine  50 mg Per Tube Q0600   Zetta Bills, MD 08/08/2023, 8:37 AM

## 2023-08-08 NOTE — Progress Notes (Signed)
[facility]  PROGRESS NOTE  AMIE SPAWN NFA:[id number] DOB: [birth year] DOA: 07/09/2023 PCP: [name], NP   Brief HPI:    42 yo M w/ pertinent PMH ESRD on HD MWF, IDDM, gastroparesis, left AKA, HTN presents to [facility] on 10/11 w/ AMS.   Patient recently admitted to [facility] on 7/12 sepsis from right diabetic foot ulcer. Cultures w/ MRSA. Patient had dialysis catheter removed and replaced. ID treated w/ vanc. Patient refused echo and left on 7/17 against medical advise.    On 10/11 patient was found by family member on ground altered. EMS arrived and dialysis catheter partially removed. Patient more alert for them. Has been complaining of diarrhea x1 week per family. Unsure if patient went to dialysis today. Transferred to [facility] ED. On arrival bp stable 107/70 and afebrile. Patient remains confused and occasionally with agitation. Given dilaudid. CBG 497. Beta H >8. VBG ph 7.06, 16, 54, 4.7. Given iv fluids and started on insulin drip for DKA. CT head negative for acute abnormality. Cxr no significant findings. CT abd/pelvis no acute findings; possible steatosis vs. Liver mass 7 x 4 cm recommending f/u w/ MRI. WBC 20.7 and LA 2.4. Sepsis protocol initiated. Cultures obtained and started on vanc/zosyn. Given ams, dka, and soft bp pccm consulted for icu admission.   10/21 pccm called overnight to assess for worsening hypoxia and AMS after vomiting. On NRB w/ sats 70-80s. Hypoglycemic given dextrose which improved his mental status some. Transferred to 2H icu for intubation.   Patient was extubated, transferred to progressive care unit under [facility] service on 08/04/2023  10/12: altered mental status, DKA 10/12: blood cultures positive for MRSA 10/13 TTE LVEF 25-30% w/ severely depressed LVF, global hypokinesis, gd I diastolic dysfxn, mod RV fxn reduction. Possible filamentous structure on MV 10/16 TEE LVEF 30-35%RV fxn nml. Large mass (2.00 cm x 0.88 cm ) on the posterior mitral leaflet which  appeared to be extending in the mitral annulus. There are two mobile masses visible on the anterior leaflet of the tricupid valve which also appears to have a perforation. Another mobile  mass ( 0.68 cm x 0.67 cm) on the posterior tricupid leaflet. Moderate Tricuspid regurgitation which appears to  be through the leaflet perforation  10/16 seen by Cardiothoracic surg. Felt poor candidate. Recommended 6 weeks abx and recommended repeat TEE 1 week 10/17 noting left arm weakness. Not clear when started.  MRI showed Right MCA infarct w/ acute infarcts in left occipital and bilateral cerebellum felt c/w septic emboli. Neuro consulted.  10/18 tunneled left femoral vasc cath placed after catheter holiday  10/20 palliative care consulted later that day worsening hypoxia. Sats 70s. After vomiting. More lethargic  10/21: resp failure requiring intubation shortly after midnight  10/22- TEE 10/22 with EF 35%, LV global hypokinesis. Very large vegetation lateral scallop of posterior mitral leaflet 3.9cm length 1 cm width, highly mobile, prolapsing into LV. No large vegetation seen on TV.  Later that afternoon and abrupt hemodynamic changes requiring escalation of vasopressor support.  Felt given the size of his vegetation and increased bulk of the vegetation infections unlikely to clear.  He was felt to be decompensation may have due to of breath leaflet perforation and acute mitral valve regurgb 10/23: per nursing less responsive than yesterday, increased ETT secretions. Clotting off CRRT. Ct head showing expected evolution of the right MCA stroke.  Cardiothoracic surgery re-consulted for worsening MVE. Felt too high risk here. Recommended referral to tertiary care  10/24 agitated when  sedation decreased, is purposeful but does not follow commands. Tolerating UF gaols of -150. Still on NE infusion but extubated later that afternoon after passing SBT 10/25 failed swallow eval still on just 3 lpm O2. Encephalopathic.  Added seroquel in effort to get him off the precedex. Cardiology told no beds at [facility]. Not clear if accepted or not. Worsening pressor requirements. Got blood for hgb 6. Cefepime resumed for clinical decline and leukocytosis  10/26 Hemoglobin again dropped to 6.3 overnight with increased pressor requirement yesterday afternoon now on vaso and levo. 10/27 Hypoglycemic episode overnight, mental status improved a little over weekend  10/28 Getting another unit of blood more awake.  Oriented x 2.  Still on Precedex.  Having liquid stools. 10/30 Off Precedex, low-dose levo.  Plan to transition to IHD 10/29 Cefepime for aspiration course completed.  10/31 Seen by overnight team for hypoxia and somnolence ABG with mild hypercapnia. Again declined by [facility] because they were at capacity. More awake. Doing better w/ Bed side swallow but still likely aspirating. CRRT stopped.  11/1 Adjustments made for better glycemic control.  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 - Supplemental O2 support for O2 sat > 90%, on RA at present - Pulmonary hygiene (IS/flutter, mobilization as able) - Vancomycin per ID recs - Obtain CT 4-6 weeks  ?  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 - Low dose metoprolol held in the setting of hypotension, consider slow resumption - Definitive management is valve repair/replacement - Cardiology/TCTS consulted, recommend transfer to tertiary care center due to high-risk patient/procedure - [facility] has denied transfer x 2 (?capacity), [facility] denied for multiple prohibitive surgical risks -Again called [facility] for transfer on 08/05/2023, declined at this time due to capacity.   Ongoing sinus tachycardia, ?r/t LUE DVT Heart rate remains 130s to 140s. - Cardiac monitoring - Optimize electrolytes for K > 4, Mg > 2 -Dose of midodrine  has been changed to 5 mg 3 times daily today.  Will observe today and consider starting low-dose metoprolol in a.m. if blood pressure remains elevated   LUE DVT Dopplers 11/4 positive for L IJ, L [place], L axillary veins; septic versus VTE. - Heparin gtt started for [facility] - Levels per pharmacy   Acute metabolic encephalopathy w/ agitated delirium  -Resolved, back to baseline Acute Right MCA stroke/septic infarct w/ left sided hemiparesis   MRA Brain 10/17 ?R M2 occlusion, found to have R MCA territory infarct, favored to be septic emboli. - Delirium precautions - Klonopin PRN - Continue Seroquel - PT/OT/SLP as able to participate in care   End-stage renal disease on hemodialysis (MWF at baseline) Has occluded  bilateral [place] veins  Mild hyperkalemia off CRRT - Nephrology following, appreciate assistance - Successfully transitioned to [facility], next 11/6 - Trend BMP - Replete electrolytes as indicated - Monitor I&Os - Avoid nephrotoxic agents as able - Ensure adequate renal perfusion   Dysphagia - TF via Cortrak -Speech therapy worked with patient today, was able to swallow food better today -Started on dysphagia 1 diet with  nectar thick liquid -Will see how he does with diet over next few days -Might need long-term PEG tube placement  T2DM w/ hyperglycemia and hypoglycemia, brittle -CBG has been elevated -Change Semglee to 10 units subcu twice daily -Change sliding scale insulin to moderate -Continue CBG 4 times daily    Anemia and thrombocytopenia critical illness S/p 5 units total PRBC. -Hemoglobin is 7.8   Incidental finding of Liver steatosis vs. liver mass CT A/P showing 7 x 4 cm density in anterior liver. - Outpatient surveillance/GI follow up    Medications     calcitRIOL  1.5 mcg Per Tube Q M,W,F   Chlorhexidine Gluconate Cloth  6 each Topical Q0600   darbepoetin (ARANESP) injection - DIALYSIS  40 mcg Subcutaneous Q Fri-1800   feeding supplement (PROSource TF20)  60 mL Per Tube Daily   fiber supplement (BANATROL TF)  60 mL Per Tube BID   gabapentin  300 mg Per Tube QHS   insulin aspart  0-9 Units Subcutaneous Q4H   insulin glargine-yfgn  8 Units Subcutaneous BID   lidocaine  1 Application Topical Once   metoprolol tartrate  12.5 mg Oral BID   midodrine  5 mg Per Tube Q8H   multivitamin  1 tablet Per Tube QHS   nutrition supplement (JUVEN)  1 packet Per Tube BID BM   mouth rinse  15 mL Mouth Rinse 4 times per day   QUEtiapine  50 mg Per Tube QHS   QUEtiapine  50 mg Per Tube Q0600     Data Reviewed:   CBG:  Recent Labs  Lab 08/07/23 1151 08/07/23 1600 08/07/23 1951 08/07/23 2344 08/08/23 0344  GLUCAP 127* 125* 197* 216* 286*    SpO2: 100 % O2 Flow Rate (L/min): 4 L/min FiO2 (%): 32 %    Vitals:   08/07/23 1900 08/07/23 1949 08/07/23 2347 08/08/23 0345  BP: (!) 140/47 128/72 98/60 (!) 155/83  Pulse: (!) 135 (!) 129 (!) 121 (!) 119  Resp: 17 17 13 18   Temp:  (!) 102.8 F (39.3 C) 99.2 F (37.3 C) 98.5 F (36.9 C)  TempSrc:  Oral Oral Oral  SpO2: 93% 91% 100% 100%  Weight:    80.8 kg  Height:          Data Reviewed:  Basic Metabolic Panel: Recent Labs   Lab 08/02/23 0410 08/03/23 0324 08/04/23 0406 08/05/23 0508 08/06/23 0614 08/07/23 0540 08/08/23 0430  NA 134* 133* 132* 130*  --   --   --   K 5.5* 4.3 4.5 3.6  --   --   --   CL 90* 93* 92* 90*  --   --   --   CO2 29 28 25 25   --   --   --   GLUCOSE 234* 201* 158* 234*  --   --   --   BUN 108* 62* 107* 45*  --   --   --   CREATININE 6.02* 3.82* 5.23* 3.71*  --   --   --   CALCIUM 9.2 9.0 9.2 8.7*  --   --   --  MG 2.7* 2.0 2.3 2.1 2.5* 2.4 2.8*  PHOS 6.0* 3.5 4.6 4.2  --   --   --     CBC: Recent Labs  Lab 08/04/23 0406 08/05/23 0508 08/06/23 0740 08/07/23 0540 08/08/23 0430  WBC 16.9* 14.2* 16.0* 13.4* 22.3*  HGB 8.1* 7.9* 8.0* 7.9* 7.8*  HCT 26.8* 25.3* 26.3* 25.7* 26.3*  MCV 94.7 96.2 96.3 97.3 97.4  PLT 362 331 355 302 271    LFT No results for input(s): "AST", "ALT", "ALKPHOS", "BILITOT", "PROT", "ALBUMIN" in the last 168 hours.   Antibiotics: Anti-infectives (From admission, onward)    Start     Dose/Rate Route Frequency Ordered Stop   08/02/23 1200  vancomycin (VANCOREADY) IVPB 750 mg/150 mL        750 mg 150 mL/hr over 60 Minutes Intravenous Every M-W-F (Hemodialysis) 07/31/23 1404     07/30/23 1800  vancomycin (VANCOREADY) IVPB 750 mg/150 mL        750 mg 150 mL/hr over 60 Minutes Intravenous Every M-W-F (Hemodialysis) 07/30/23 1358 07/30/23 1923   07/29/23 0812  vancomycin variable dose per unstable renal function (pharmacist dosing)  Status:  Discontinued         Does not apply See admin instructions 07/29/23 0813 07/31/23 1404   07/28/23 1000  vancomycin (VANCOREADY) IVPB 750 mg/150 mL  Status:  Discontinued        750 mg 150 mL/hr over 60 Minutes Intravenous Every 24 hours 07/27/23 1539 07/29/23 0813   07/27/23 1630  vancomycin (VANCOREADY) IVPB 500 mg/100 mL        500 mg 100 mL/hr over 60 Minutes Intravenous  Once 07/27/23 1539 07/27/23 1759   07/23/23 1945  ceFEPIme (MAXIPIME) 2 g in sodium chloride 0.9 % 100 mL IVPB  Status:  Discontinued         2 g 200 mL/hr over 30 Minutes Intravenous Every 12 hours 07/23/23 1856 07/27/23 1053   07/21/23 1200  vancomycin (VANCOREADY) IVPB 750 mg/150 mL  Status:  Discontinued        750 mg 150 mL/hr over 60 Minutes Intravenous Every M-W-F (Hemodialysis) 07/19/23 1106 07/20/23 0842   07/21/23 0230  vancomycin (VANCOREADY) IVPB 750 mg/150 mL  Status:  Discontinued        750 mg 150 mL/hr over 60 Minutes Intravenous Every 24 hours 07/20/23 1451 07/27/23 1314   07/20/23 2200  ceFEPIme (MAXIPIME) 2 g in sodium chloride 0.9 % 100 mL IVPB  Status:  Discontinued        2 g 200 mL/hr over 30 Minutes Intravenous Every 12 hours 07/20/23 1451 07/21/23 1108   07/19/23 2200  ceFEPIme (MAXIPIME) 1 g in sodium chloride 0.9 % 100 mL IVPB  Status:  Discontinued        1 g 200 mL/hr over 30 Minutes Intravenous Every 24 hours 07/19/23 1409 07/20/23 1451   07/19/23 2000  ceFEPIme (MAXIPIME) 2 g in sodium chloride 0.9 % 100 mL IVPB  Status:  Discontinued        2 g 200 mL/hr over 30 Minutes Intravenous Every M-W-F 07/19/23 0110 07/19/23 1409   07/19/23 0200  ceFEPIme (MAXIPIME) 2 g in sodium chloride 0.9 % 100 mL IVPB        2 g 200 mL/hr over 30 Minutes Intravenous  Once 07/19/23 0110 07/19/23 0221   07/18/23 1015  vancomycin (VANCOREADY) IVPB 750 mg/150 mL        750 mg 150 mL/hr over 60 Minutes Intravenous  Once 07/18/23 0929  07/19/23 1137   07/16/23 0000  ceFAZolin (ANCEF) IVPB 2g/100 mL premix        2 g 200 mL/hr over 30 Minutes Intravenous To Radiology 07/15/23 1408 07/16/23 1250   07/14/23 1200  vancomycin (VANCOREADY) IVPB 750 mg/150 mL  Status:  Discontinued        750 mg 150 mL/hr over 60 Minutes Intravenous Every M-W-F (Hemodialysis) 07/12/23 1203 07/19/23 1106   07/11/23 2330  vancomycin (VANCOREADY) IVPB 750 mg/150 mL        750 mg 150 mL/hr over 60 Minutes Intravenous  Once 07/11/23 2030 07/12/23 0110   07/11/23 1800  vancomycin (VANCOREADY) IVPB 750 mg/150 mL  Status:  Discontinued         750 mg 150 mL/hr over 60 Minutes Intravenous  Once 07/11/23 0856 07/11/23 1810   07/10/23 2318  vancomycin variable dose per unstable renal function (pharmacist dosing)  Status:  Discontinued         Does not apply See admin instructions 07/10/23 2319 07/12/23 1203   07/10/23 1429  vancomycin (VANCOREADY) IVPB 750 mg/150 mL  Status:  Discontinued        750 mg 150 mL/hr over 60 Minutes Intravenous Every Dialysis 07/10/23 1429 07/11/23 2030   07/10/23 1230  fidaxomicin (DIFICID) tablet 200 mg  Status:  Discontinued        200 mg Oral 2 times daily 07/10/23 1139 07/10/23 1207   07/10/23 1000  piperacillin-tazobactam (ZOSYN) IVPB 2.25 g  Status:  Discontinued        2.25 g 100 mL/hr over 30 Minutes Intravenous Every 8 hours 07/10/23 0908 07/10/23 1350   07/09/23 2330  vancomycin (VANCOREADY) IVPB 1750 mg/350 mL        1,750 mg 175 mL/hr over 120 Minutes Intravenous  Once 07/09/23 2329 07/10/23 0734   07/09/23 2330  piperacillin-tazobactam (ZOSYN) IVPB 3.375 g        3.375 g 100 mL/hr over 30 Minutes Intravenous  Once 07/09/23 2329 07/10/23 0240        DVT prophylaxis: Heparin  Code Status: Full code  Family Communication: Discussed with patient's mother on phone   CONSULTS PCCM, nephrology, ID, CT surgery   Subjective   Patient upset this morning that he is being labeled as HIV positive by nurses.  All the lab work in the epic is negative for HIV.  Last HIV antibody from July 2024 was negative.  Objective    Physical Examination:  General-appears in no acute distress Heart-S1-S2, regular, no murmur auscultated Lungs-clear to auscultation bilaterally, no wheezing or crackles auscultated Abdomen-soft, nontender, no organomegaly Extremities-left AKA Neuro-alert, oriented x3, no focal deficit noted   Status is: Inpatient:      Pressure Injury 08/03/23 Buttocks Left Stage 2 -  Partial thickness loss of dermis presenting as a shallow open injury with a red, pink wound bed  without slough. Several small open areas, right buttocks area (Active)  08/03/23 1945  Location: Buttocks  Location Orientation: Left  Staging: Stage 2 -  Partial thickness loss of dermis presenting as a shallow open injury with a red, pink wound bed without slough.  Wound Description (Comments): Several small open areas, right buttocks area  Present on Admission:         Kloee Ballew S Kirstyn Lean   [facility] If 7PM-7AM, please contact night-coverage at [email or web address], Office  [phone]   08/08/2023, 7:53 AM  LOS: 29 days

## 2023-08-09 DIAGNOSIS — R570 Cardiogenic shock: Secondary | ICD-10-CM | POA: Diagnosis not present

## 2023-08-09 DIAGNOSIS — E1010 Type 1 diabetes mellitus with ketoacidosis without coma: Secondary | ICD-10-CM | POA: Diagnosis not present

## 2023-08-09 DIAGNOSIS — I059 Rheumatic mitral valve disease, unspecified: Secondary | ICD-10-CM | POA: Diagnosis not present

## 2023-08-09 DIAGNOSIS — I63311 Cerebral infarction due to thrombosis of right middle cerebral artery: Secondary | ICD-10-CM | POA: Diagnosis not present

## 2023-08-09 LAB — RENAL FUNCTION PANEL
Albumin: 2 g/dL — ABNORMAL LOW (ref 3.5–5.0)
Anion gap: 13 (ref 5–15)
BUN: 86 mg/dL — ABNORMAL HIGH (ref 6–20)
CO2: 22 mmol/L (ref 22–32)
Calcium: 9 mg/dL (ref 8.9–10.3)
Chloride: 98 mmol/L (ref 98–111)
Creatinine, Ser: 6.46 mg/dL — ABNORMAL HIGH (ref 0.61–1.24)
GFR, Estimated: 10 mL/min — ABNORMAL LOW (ref >60)
Glucose, Bld: 209 mg/dL — ABNORMAL HIGH (ref 70–99)
Phosphorus: 4.7 mg/dL — ABNORMAL HIGH (ref 2.5–4.6)
Potassium: 4.4 mmol/L (ref 3.5–5.1)
Sodium: 133 mmol/L — ABNORMAL LOW (ref 135–145)

## 2023-08-09 LAB — CBC
HCT: 23.1 % — ABNORMAL LOW (ref 39.0–52.0)
Hemoglobin: 7.1 g/dL — ABNORMAL LOW (ref 13.0–17.0)
MCH: 29.5 pg (ref 26.0–34.0)
MCHC: 30.7 g/dL (ref 30.0–36.0)
MCV: 95.9 fL (ref 80.0–100.0)
Platelets: 265 10*3/uL (ref 150–400)
RBC: 2.41 MIL/uL — ABNORMAL LOW (ref 4.22–5.81)
RDW: 17 % — ABNORMAL HIGH (ref 11.5–15.5)
WBC: 12.6 10*3/uL — ABNORMAL HIGH (ref 4.0–10.5)
nRBC: 0 % (ref 0.0–0.2)

## 2023-08-09 LAB — GLUCOSE, CAPILLARY
Glucose-Capillary: 182 mg/dL — ABNORMAL HIGH (ref 70–99)
Glucose-Capillary: 129 mg/dL — ABNORMAL HIGH (ref 70–99)
Glucose-Capillary: 110 mg/dL — ABNORMAL HIGH (ref 70–99)
Glucose-Capillary: 185 mg/dL — ABNORMAL HIGH (ref 70–99)
Glucose-Capillary: 72 mg/dL (ref 70–99)
Glucose-Capillary: 79 mg/dL (ref 70–99)
Glucose-Capillary: 95 mg/dL (ref 70–99)
Glucose-Capillary: 117 mg/dL — ABNORMAL HIGH (ref 70–99)

## 2023-08-09 LAB — HEPARIN LEVEL (UNFRACTIONATED): Heparin Unfractionated: 0.47 [IU]/mL (ref 0.30–0.70)

## 2023-08-09 LAB — HIV-1 RNA QUANT-NO REFLEX-BLD
HIV 1 RNA Quant: <20 {copies}/mL
LOG10 HIV-1 RNA: UNDETERMINED {Log}

## 2023-08-09 LAB — MAGNESIUM: Magnesium: 2.9 mg/dL — ABNORMAL HIGH (ref 1.7–2.4)

## 2023-08-09 MED ORDER — HEPARIN SODIUM (PORCINE) 1000 UNIT/ML IJ SOLN
4600.0000 [IU] | Freq: Once | INTRAMUSCULAR | Status: AC
  Administered 2023-08-09: 4600 [IU]
  Filled 2023-08-09: qty 4.6
  Filled 2023-08-09: qty 5

## 2023-08-09 MED ORDER — NEPRO/CARBSTEADY PO LIQD
237.0000 mL | Freq: Three times a day (TID) | ORAL | Status: DC
  Administered 2023-08-09: 237 mL via ORAL
  Filled 2023-08-09: qty 237

## 2023-08-09 MED ORDER — NEPRO/CARBSTEADY PO LIQD
1000.0000 mL | ORAL | Status: DC
  Administered 2023-08-09 – 2023-08-14 (×6): 1000 mL
  Filled 2023-08-09 (×6): qty 1000

## 2023-08-09 MED ORDER — APIXABAN 5 MG PO TABS
5.0000 mg | ORAL_TABLET | Freq: Two times a day (BID) | ORAL | Status: DC
  Administered 2023-08-09 – 2023-08-11 (×5): 5 mg via ORAL
  Filled 2023-08-09 (×5): qty 1

## 2023-08-09 NOTE — Progress Notes (Signed)
Physical Therapy Treatment Patient Details Name: [name] MRN: [id number] DOB: 12/28/1980 Today's Date: 08/09/2023   History of Present Illness Patient is a 42 y/o male admitted 07/09/23 with confusion found to be in septic shock found to have MRSA in blood cultures, in DKA, TEE positive for vegitation on multiple valves, MRI showing R MCA infarct.  Pt with resp failure on 10/21 resulting in intubation and transfer to [facility]; extubated 10/24.  CRRT started 10/22-stopped 10/30.  PMH positive for ESRD on HD, IDDM, gastroparesis, L AKA, HTN.    PT Comments  Pt remains severely impaired with all mobility and balance. Pt pushing heavily with rt UE making sitting balance difficult. Will need mechanical lift for any OOB activity. Will need extended rehab post acute.     If plan is discharge home, recommend the following: Two people to help with walking and/or transfers;Assist for transportation;Supervision due to cognitive status;Two people to help with bathing/dressing/bathroom;Help with stairs or ramp for entrance   Can travel by private vehicle     No  Equipment Recommendations  Hoyer lift;Wheelchair (measurements PT)    Recommendations for Other Services       Precautions / Restrictions Precautions Precautions: Fall Precaution Comments: LAKA, L hemiparesis, R wrist restraint, flexiseal, coretrak, blood clot LUE Restrictions Weight Bearing Restrictions: No LLE Weight Bearing: Non weight bearing     Mobility  Bed Mobility Overal bed mobility: Needs Assistance Bed Mobility: Supine to Sit, Sit to Supine     Supine to sit: +2 for physical assistance, Total assist, HOB elevated Sit to supine: +2 for physical assistance, Total assist   General bed mobility comments: Assist for all aspects except to move RLE toward EOB    Transfers                        Ambulation/Gait                   Stairs             Wheelchair Mobility     Tilt Bed     Modified Rankin (Stroke Patients Only)       Balance Overall balance assessment: Needs assistance Sitting-balance support: Single extremity supported Sitting balance-[name]: Poor Sitting balance - Comments: Sat EOB x 12 minutes with min to max assist. Pt pushing heavily with RUE. Worked on propping on rt elbow to reduce pushing. Also worked on reaching with RUE to reduce pushing. Postural control: Left lateral lean                                  Cognition Arousal: Lethargic Behavior During Therapy: Flat affect Overall Cognitive Status: Difficult to assess                                 General Comments: Pt did not speak except x 1. Followed most 1 step commands with incr time.        Exercises      General Comments General comments (skin integrity, edema, etc.): VSS      Pertinent Vitals/Pain Pain Assessment Pain Assessment: Faces Faces Pain Scale: No hurt    Home Living                          Prior Function  PT Goals (current goals can now be found in the care plan section) Additional Goals Additional Goal #1:  (defer at this time) Progress towards PT goals: Not progressing toward goals - comment    Frequency    Min 1X/week      PT Plan      Co-evaluation              AM-PAC PT "6 Clicks" Mobility   Outcome Measure    Help needed moving from lying on your back to sitting on the side of a flat bed without using bedrails?: Total Help needed moving to and from a bed to a chair (including a wheelchair)?: Total Help needed standing up from a chair using your arms (e.g., wheelchair or bedside chair)?: Total Help needed to walk in hospital room?: Total Help needed climbing 3-5 steps with a railing? : Total 6 Click Score: 5    End of Session   Activity Tolerance: Patient tolerated treatment well Patient left: in bed;with call bell/phone within reach;with bed alarm set Nurse  Communication: Mobility status PT Visit Diagnosis: Other abnormalities of gait and mobility (R26.89);Other symptoms and signs involving the nervous system (R29.898);Hemiplegia and hemiparesis Hemiplegia - Right/Left: Left Hemiplegia - dominant/non-dominant: Non-dominant Hemiplegia - caused by: Cerebral infarction     Time: 0932-3557 PT Time Calculation (min) (ACUTE ONLY): 18 min  Charges:    $Therapeutic Activity: 8-22 mins PT General Charges $$ ACUTE PT VISIT: 1 Visit                     [facility] PT Acute Rehabilitation Services Office [phone]    Angelina Ok [facility] 08/09/2023, 10:50 AM

## 2023-08-09 NOTE — Progress Notes (Signed)
Received patient in bed to unit.  Alert and oriented.  Informed consent signed and in chart.   TX duration:3 hours and 15 minutes  Patient tolerated well.  Transported back to the room  Alert, without acute distress.  Hand-off given to patient's nurse.   Access used: L Femoral Leg HD Cath Access issues: none  Total UF removed: Medication(s) given: Vancomycin, Dilaudid   08/09/23 1810  Vitals  Temp 99.3 F (37.4 C)  Temp Source Oral  BP (!) 123/48  MAP (mmHg) 70  BP Location Right Leg  BP Method Automatic  Patient Position (if appropriate) Lying  Pulse Rate (!) 110  Pulse Rate Source Monitor  ECG Heart Rate (!) 111  Resp 18  Level of Consciousness  Level of Consciousness Alert  MEWS COLOR  MEWS Score Color Yellow  Oxygen Therapy  SpO2 92 %  O2 Device Room Air  Pain Assessment  Pain Scale 0-10  Pain Score 0  PCA/Epidural/Spinal Assessment  Respiratory Pattern Regular;Unlabored  ECG Monitoring  Cardiac Rhythm ST  MEWS Score  MEWS Temp 0  MEWS Systolic 0  MEWS Pulse 2  MEWS RR 0  MEWS LOC 0  MEWS Score 2     Stacie Glaze LPN [facility]

## 2023-08-09 NOTE — Progress Notes (Addendum)
PHARMACY - ANTICOAGULATION CONSULT NOTE  Pharmacy Consult for heparin Indication: DVT  No Known Allergies  Patient Measurements: Height: 5\' 7"  (170.2 cm) Weight: 80.9 kg (178 lb 5.6 oz) IBW/kg (Calculated) : 66.1 Heparin Dosing Weight: 75 kg  Vital Signs: Temp: 97.8 F (36.6 C) (11/11 0738) Temp Source: Axillary (11/11 0738) BP: 97/47 (11/11 0738) Pulse Rate: 106 (11/11 0738)  Labs: Recent Labs    08/07/23 0540 08/07/23 0808 08/08/23 0430 08/09/23 0651  HGB 7.9*  --  7.8* 7.1*  HCT 25.7*  --  26.3* 23.1*  PLT 302  --  271 265  HEPARINUNFRC  --  0.43 0.63 0.47    Estimated Creatinine Clearance: 26.1 mL/min (A) (by C-G formula based on SCr of 3.71 mg/dL (H)).   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  Has required multiple blood transfusions (5 units total, last 11/5 for Hgb 6.8).  Patient is on HD. Pharmacy consulted to dose heparin.  No anticoagulation PTA.    Heparin level therapeutic at 0.47 on IV UFH at 2600 units/hr.  No bleeding noted, Hgb low 7-8 range.   Goal of Therapy:  Heparin level 0.3-0.7 units/ml Monitor platelets by anticoagulation protocol: Yes   Plan:  Continue heparin drip at 2600 units/hr Monitor daily heparin level, CBC, signs/symptoms of bleeding   Thank you for involving pharmacy in this patient's care.  Loura Back, PharmD, BCPS Clinical Pharmacist 08/09/2023 9:22 AM  Addendum: Transitioning to apixaban. Stop heparin drip at the time apixaban is given.  Loura Back, PharmD, BCPS 10:06 AM

## 2023-08-09 NOTE — Plan of Care (Signed)
  Problem: Clinical Measurements: Goal: Respiratory complications will improve Outcome: Progressing Goal: Cardiovascular complication will be avoided Outcome: Progressing   Problem: Pain Management: Goal: General experience of comfort will improve Outcome: Progressing   Problem: Safety: Goal: Ability to remain free from injury will improve Outcome: Progressing

## 2023-08-09 NOTE — Progress Notes (Addendum)
Nutrition Follow-up  DOCUMENTATION CODES:   Severe malnutrition in context of chronic illness  INTERVENTION:   - Initiate tube feeding via  Cortrak : 30 ml/h and advance by 10 ml/hr every 8 hours until goral rate of   55 ml/h (1320 ml per day) Prosource TF20 60 ml daily  Provides 2416 kcal, 127 gm protein, 960 ml free water daily  - Recommend adding Q4 insulin coverage to for tube feeds  - Continue renal MVI daily per tube  - Continue 1 packet Juven BID per tube to support wound healing, each packet provides 95 calories, 2.5 grams of protein, and 9.8 grams of carbohydrate   - Continue Banatrol TF 60 ml TID  - Provide Nepro shake PO TID,  each supplement provides 425 kcal and 19 grams protein   NUTRITION DIAGNOSIS:   Severe Malnutrition related to chronic illness as evidenced by severe fat depletion, severe muscle depletion.  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  10/22 - CRRT start, TEE confirming large MV vegetation with AML perforation 10/24 - extubated 10/25 - Cortrak placed (tip near pylorus) 10/30 - CRRT off 11/03 - TF changed to Nepro 11/05 - Cortrak clogged and removed, replaced with NG tube 11/06 - pt removed NG tube, Cortrak placed (x-ray pending) 11/08 - Diet advanced to DYS 1, nectar thick  11/10 - TF stopped due to high blood sugar   Pt in dialysis on visit. Spoke to nsg about his  intake. She reports he has not been eating much and only ate around 10% for breakfast. Per nsg documentation pt has been eating around 32% of his meals. Will add Nepro TID to increase PO intake.  MD stopped feeds overnight due to high blood glucose (460), feeds have not been restarted today. High blood sugars may have been caused from him starting to eat again. Recommend starting tube feeds when pt gets back from dialysis, Start Nepro at  30 ml/h and advance by 10 ml/hr every 8 hours until goral rate of  55 ml/h (1320 ml per day) Prosource TF20 60 ml daily. Q4 insulin coverage stopped, recommend reordering when pt is back on tube feeds.   Admit weight: 77.6 kg  Current weight: 84.1 kg    Average Meal Intake: 11/10-11/11: 32% intake x 4 recorded meals  Nutritionally Relevant Medications: Scheduled Meds:  calcitRIOL  1.5 mcg Per Tube Q M,W,F   [START ON 08/13/2023] darbepoetin (ARANESP) injection - DIALYSIS  100 mcg Subcutaneous Q Fri-1800   feeding supplement (NEPRO CARB STEADY)  1,000 mL Per Tube Q24H   feeding supplement (PROSource TF20)  60 mL Per Tube Daily   fiber supplement (BANATROL TF)  60 mL Per Tube BID   gabapentin  300 mg Per Tube QHS   insulin aspart  0-5 Units Subcutaneous QHS   insulin aspart  0-9 Units Subcutaneous TID WC   insulin aspart  10 Units Subcutaneous TID WC   insulin  glargine-yfgn  10 Units Subcutaneous BID   multivitamin  1 tablet Per Tube QHS   nutrition supplement (JUVEN)  1 packet Per Tube BID BM   Continuous Infusions:  vancomycin Stopped (08/06/23 1752)   Labs Reviewed: Sodium 133, Phosphorus 4.7, Magnesium 2.9, CBG ranges from 110-496 mg/dL over the last 24 hours HgbA1c 8.9   Diet Order:   Diet Order             DIET - DYS 1 Room service appropriate? Yes with Assist; Fluid consistency: Nectar Thick  Diet effective now                   EDUCATION NEEDS:   Not appropriate for education at this time  Skin:  Skin Assessment: Skin  Integrity Issues: Skin Integrity Issues:: Stage II Stage II: R buttocks Other: skin tear to sacrum-small open red area in between buttocks, red abrasion to left buttocks  Last BM:  11/11  Height:   Ht Readings from Last 1 Encounters:  07/19/23 5\' 7"  (1.702 m)    Weight:   Wt Readings from Last 1 Encounters:  08/09/23 84.1 kg    Ideal Body Weight:  61.9 kg (adjusted for L AKA)  BMI:  Body mass index is 29.04 kg/m.  Estimated Nutritional Needs:   Kcal:  2200-2500kcal/day  Protein:  120-135g/day  Fluid:  UOP +1L  Elliot Dally, RD Registered Dietitian  See Amion for more information

## 2023-08-09 NOTE — Progress Notes (Signed)
Transported to  HD awake  and alert .

## 2023-08-09 NOTE — Progress Notes (Signed)
Back from HD by bed awake and alert. °

## 2023-08-09 NOTE — Progress Notes (Signed)
[facility] Progress Note  Subjective: seen in room, working w/ Speech team to swallow better. No c/o's from pateitn.   Vitals:   08/09/23 0738 08/09/23 0800 08/09/23 0959 08/09/23 1000  BP: (!) 97/47 (!) 107/45 (!) 120/106 (!) 111/97  Pulse: (!) 106 100  (!) 111  Resp: 11 14  20   Temp: 97.8 F (36.6 C)     TempSrc: Axillary     SpO2:  94%  95%  Weight:      Height:        Exam: Gen: Awake, alert, somewhat confused resting in bed, working w/ ST CVS: Regular tachycardia, S1 and S2 Resp: Transmitted breath sounds, no distinct rales or rhonchi, [place] O2 Abd: Soft, flat, nontender, bowel sounds normal Ext: Status post left AKA, no edema right leg.  . Bilat UE nonpitting edema.  Access: Left femoral TDC in place    Renal-related home meds: - sensipar 120 at bedtime - gabapentin 100mg  - renvavite - renvela 3 ac tid    OP HD: MWF [facility] 4h  400/1.5    77kg   2/3 bath  [facility] TDC    Heparin none - last OP HD 10/7, post wt 74.6kg - misses HD once every other week approx - rocaltrol 1.50 mcg    Assessment/ Plan: MRSA bacteremia with mitral valve endocarditis: Suspected to be related to catheter related bacteremia. SP line holiday w/ old TDC removed 10/16 and new TDC placed 10/18.  Remains on intravenous vancomycin. Options being explored for transfer to tertiary center for mitral valve repair. 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  ESRD: on HD MWF. SP course of CRRT 10/22- 10/30. Not suitable for outpatient hemodialysis at this time. Next HD today.  Volume: up 5kg, on 2-4 lpm [place] O2. Lower vol w/ HD as tolerated.  HD access: SP line holiday with new L  femoral TDC placed on 10/18. 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  Anemia: Hb 7- 9 range. ESA dose increased for darbepoetin to 100 mcg q. weekly. 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 Monitor on renal diet.   On calcitriol for PTH control. Nutrition: On dysphagia 1 diet following swallow evaluation and ongoing tube feeds. Hypotension: Weaned off of pressors. Weaning down midodrine, currently on 5 mg 3 times a day.     Vinson Moselle MD  [facility] 08/09/2023, 11:03 AM  Recent Labs  Lab 08/04/23 0406 08/05/23 0508 08/06/23 0740 08/08/23 0430 08/09/23 0651  HGB 8.1* 7.9*   < > 7.8* 7.1*  CALCIUM 9.2 8.7*  --   --   --   PHOS 4.6 4.2  --   --   --   CREATININE 5.23* 3.71*  --   --   --   K 4.5 3.6  --   --   --    < > = values in this interval not displayed.   No results for input(s): "IRON", "TIBC", "FERRITIN" in the last 168 hours. Inpatient medications:  apixaban  5 mg Oral BID   calcitRIOL  1.5 mcg Per Tube Q M,W,F   Chlorhexidine Gluconate Cloth  6 each Topical Q0600   [START ON 08/13/2023] darbepoetin (ARANESP) injection - DIALYSIS  100 mcg Subcutaneous Q Fri-1800   feeding supplement (NEPRO CARB STEADY)  1,000 mL Per Tube Q24H   feeding supplement (PROSource TF20)  60 mL Per Tube Daily   fiber supplement (BANATROL TF)  60 mL Per Tube BID   gabapentin  300 mg Per Tube QHS   insulin aspart  0-5 Units Subcutaneous QHS   insulin aspart  0-9 Units Subcutaneous TID WC   insulin aspart  10 Units Subcutaneous TID WC   insulin glargine-yfgn  10 Units Subcutaneous BID   lidocaine  1 Application Topical Once   metoprolol tartrate  12.5 mg Oral BID   midodrine  5 mg Per Tube Q8H   multivitamin  1 tablet Per Tube QHS   nutrition supplement (JUVEN)  1 packet Per Tube BID BM   mouth rinse  15 mL Mouth Rinse 4 times per day   QUEtiapine  50 mg Per Tube QHS   QUEtiapine  50 mg Per Tube Q0600    vancomycin Stopped (08/06/23 1752)   acetaminophen (TYLENOL) oral liquid 160 mg/5 mL, albuterol, clonazePAM, Gerhardt's butt cream, haloperidol lactate, oxyCODONE **OR** HYDROmorphone (DILAUDID) injection, mouth rinse

## 2023-08-09 NOTE — TOC Progression Note (Signed)
Transition of Care [facility]) - Progression Note    Patient Details  Name: [name] MRN: [id number] Date of Birth: 01/10/1981  [facility] of [facility]) CM/SW Contact  Harriet Masson, RN Phone Number: 08/09/2023, 5:11 PM  Clinical Narrative:     Spoke to mother, [name], regarding LTAC. [name] is agreeable for [employer] to start insurance auth. Evan with [employer] notified of the above.    Barriers to Discharge: Continued Medical Work up  Expected Discharge Plan and Services       Living arrangements for the past 2 months: Single Family Home                                       Social Determinants of Health (SDOH) Interventions SDOH Screenings   Food Insecurity: No Food Insecurity (08/03/2023)  Housing: Low Risk  (08/03/2023)  Transportation Needs: No Transportation Needs (08/03/2023)  Utilities: Not At Risk (08/03/2023)  Alcohol Screen: Low Risk  (02/15/2023)  Depression (PHQ2-9): Low Risk  (02/15/2023)  Financial Resource Strain: Low Risk  (02/15/2023)  Physical Activity: Inactive (02/15/2023)  Social Connections: Socially Integrated (02/15/2023)  Stress: No Stress Concern Present (02/15/2023)  Tobacco Use: Low Risk  (08/03/2023)    Readmission Risk Interventions     No data to display

## 2023-08-09 NOTE — Progress Notes (Signed)
[facility]  PROGRESS NOTE  [name] UUV:[id number] DOB: November 17, 1980 DOA: 07/09/2023 PCP: [name], NP   Brief HPI:    42 yo M w/ pertinent PMH ESRD on HD MWF, IDDM, gastroparesis, left AKA, HTN presents to [facility] on 10/11 w/ AMS.   Patient recently admitted to [facility] on 7/12 sepsis from right diabetic foot ulcer. Cultures w/ MRSA. Patient had dialysis catheter removed and replaced. ID treated w/ vanc. Patient refused echo and left on 7/17 against medical advise.    On 10/11 patient was found by family member on ground altered. EMS arrived and dialysis catheter partially removed. Patient more alert for them. Has been complaining of diarrhea x1 week per family. Unsure if patient went to dialysis today. Transferred to [facility] ED. On arrival bp stable 107/70 and afebrile. Patient remains confused and occasionally with agitation. Given dilaudid. CBG 497. Beta H >8. VBG ph 7.06, 16, 54, 4.7. Given iv fluids and started on insulin drip for DKA. CT head negative for acute abnormality. Cxr no significant findings. CT abd/pelvis no acute findings; possible steatosis vs. Liver mass 7 x 4 cm recommending f/u w/ MRI. WBC 20.7 and LA 2.4. Sepsis protocol initiated. Cultures obtained and started on vanc/zosyn. Given ams, dka, and soft bp pccm consulted for icu admission.   10/21 pccm called overnight to assess for worsening hypoxia and AMS after vomiting. On NRB w/ sats 70-80s. Hypoglycemic given dextrose which improved his mental status some. Transferred to 2H icu for intubation.   Patient was extubated, transferred to progressive care unit under [facility] service on 08/04/2023  10/12: altered mental status, DKA 10/12: blood cultures positive for MRSA 10/13 TTE LVEF 25-30% w/ severely depressed LVF, global hypokinesis, gd I diastolic dysfxn, mod RV fxn reduction. Possible filamentous structure on MV 10/16 TEE LVEF 30-35%RV fxn nml. Large mass (2.00 cm x 0.88 cm ) on the posterior mitral leaflet which  appeared to be extending in the mitral annulus. There are two mobile masses visible on the anterior leaflet of the tricupid valve which also appears to have a perforation. Another mobile  mass ( 0.68 cm x 0.67 cm) on the posterior tricupid leaflet. Moderate Tricuspid regurgitation which appears to  be through the leaflet perforation  10/16 seen by Cardiothoracic surg. Felt poor candidate. Recommended 6 weeks abx and recommended repeat TEE 1 week 10/17 noting left arm weakness. Not clear when started.  MRI showed Right MCA infarct w/ acute infarcts in left occipital and bilateral cerebellum felt c/w septic emboli. Neuro consulted.  10/18 tunneled left femoral vasc cath placed after catheter holiday  10/20 palliative care consulted later that day worsening hypoxia. Sats 70s. After vomiting. More lethargic  10/21: resp failure requiring intubation shortly after midnight  10/22- TEE 10/22 with EF 35%, LV global hypokinesis. Very large vegetation lateral scallop of posterior mitral leaflet 3.9cm length 1 cm width, highly mobile, prolapsing into LV. No large vegetation seen on TV.  Later that afternoon and abrupt hemodynamic changes requiring escalation of vasopressor support.  Felt given the size of his vegetation and increased bulk of the vegetation infections unlikely to clear.  He was felt to be decompensation may have due to of breath leaflet perforation and acute mitral valve regurgb 10/23: per nursing less responsive than yesterday, increased ETT secretions. Clotting off CRRT. Ct head showing expected evolution of the right MCA stroke.  Cardiothoracic surgery re-consulted for worsening MVE. Felt too high risk here. Recommended referral to tertiary care  10/24 agitated when  sedation decreased, is purposeful but does not follow commands. Tolerating UF gaols of -150. Still on NE infusion but extubated later that afternoon after passing SBT 10/25 failed swallow eval still on just 3 lpm O2. Encephalopathic.  Added seroquel in effort to get him off the precedex. Cardiology told no beds at [facility]. Not clear if accepted or not. Worsening pressor requirements. Got blood for hgb 6. Cefepime resumed for clinical decline and leukocytosis  10/26 Hemoglobin again dropped to 6.3 overnight with increased pressor requirement yesterday afternoon now on vaso and levo. 10/27 Hypoglycemic episode overnight, mental status improved a little over weekend  10/28 Getting another unit of blood more awake.  Oriented x 2.  Still on Precedex.  Having liquid stools. 10/30 Off Precedex, low-dose levo.  Plan to transition to IHD 10/29 Cefepime for aspiration course completed.  10/31 Seen by overnight team for hypoxia and somnolence ABG with mild hypercapnia. Again declined by [facility] because they were at capacity. More awake. Doing better w/ Bed side swallow but still likely aspirating. CRRT stopped.  11/1 Adjustments made for better glycemic control.  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 - Supplemental O2 support for O2 sat > 90%, on RA at present - Pulmonary hygiene (IS/flutter, mobilization as able) - Vancomycin per ID recs - Obtain CT 4-6 weeks  ?  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 - Low dose metoprolol held in the setting of hypotension, consider slow resumption - Definitive management is valve repair/replacement - Cardiology/TCTS consulted, recommend transfer to tertiary care center due to high-risk patient/procedure - [facility] has denied transfer x 2 (?capacity), [facility] denied for multiple prohibitive surgical risks -Again called [facility] for transfer on 08/05/2023, declined at this time due to capacity.   Ongoing sinus tachycardia, ?r/t LUE DVT Heart rate remains 130s to 140s. - Cardiac monitoring - Optimize electrolytes for K > 4, Mg > 2 -Dose of midodrine  has been changed to 5 mg 3 times daily today.  Will observe today and consider starting low-dose metoprolol in a.m. if blood pressure remains elevated   LUE DVT Dopplers 11/4 positive for L IJ, L Burnett, L axillary veins; septic versus VTE. - Heparin gtt started for [facility] - Levels per pharmacy   Acute metabolic encephalopathy w/ agitated delirium  -Resolved, back to baseline Acute Right MCA stroke/septic infarct w/ left sided hemiparesis   MRA Brain 10/17 ?R M2 occlusion, found to have R MCA territory infarct, favored to be septic emboli. - Delirium precautions - Klonopin PRN - Continue Seroquel - PT/OT/SLP as able to participate in care   End-stage renal disease on hemodialysis (MWF at baseline) Has occluded  bilateral  veins  Mild hyperkalemia off CRRT - Nephrology following, appreciate assistance - Successfully transitioned to [facility], next 11/6 - Trend BMP - Replete electrolytes as indicated - Monitor I&Os - Avoid nephrotoxic agents as able - Ensure adequate renal perfusion   Dysphagia - TF via Cortrak -Speech therapy worked with patient today, was able to swallow food better today -Started on dysphagia 1 diet with  nectar thick liquid -Will see how he does with diet over next few days -Might need long-term PEG tube placement  T2DM w/ hyperglycemia and hypoglycemia, brittle -CBG has been elevated to 400 yesterday -Patient started on dysphagia 2 diet along with tube feeds at 55 mL/h, tube feed was held last night -Restart tube feeding at 30 cc/h -Continue e Semglee to 10 units subcu twice daily -Change sliding scale insulin to moderate -Continue CBG to 4 hours    Anemia and thrombocytopenia critical illness S/p 5 units total PRBC. -Hemoglobin is 7.1   Incidental finding of Liver steatosis vs. liver mass CT A/P showing 7 x 4 cm density in anterior liver. - Outpatient surveillance/GI follow up    Medications     calcitRIOL  1.5 mcg Per Tube Q M,W,F   Chlorhexidine Gluconate Cloth  6 each Topical Q0600   [START ON 08/13/2023] darbepoetin (ARANESP) injection - DIALYSIS  100 mcg Subcutaneous Q Fri-1800   feeding supplement (NEPRO CARB STEADY)  1,000 mL Per Tube Q24H   feeding supplement (PROSource TF20)  60 mL Per Tube Daily   fiber supplement (BANATROL TF)  60 mL Per Tube BID   gabapentin  300 mg Per Tube QHS   insulin aspart  0-5 Units Subcutaneous QHS   insulin aspart  0-9 Units Subcutaneous TID WC   insulin aspart  10 Units Subcutaneous TID WC   insulin glargine-yfgn  10 Units Subcutaneous BID   lidocaine  1 Application Topical Once   metoprolol tartrate  12.5 mg Oral BID   midodrine  5 mg Per Tube Q8H   multivitamin  1 tablet Per Tube QHS   nutrition supplement (JUVEN)  1 packet Per Tube BID BM   mouth rinse  15 mL Mouth Rinse 4 times per day   QUEtiapine  50 mg Per Tube QHS   QUEtiapine  50 mg Per Tube Q0600     Data Reviewed:   CBG:  Recent Labs  Lab 08/08/23 1744 08/08/23 1935 08/08/23 2313 08/09/23 0536 08/09/23 0740  GLUCAP 460* 495* 292* 129* 110*    SpO2: 90 % O2 Flow Rate (L/min): 4 L/min FiO2 (%): 32 %    Vitals:   08/08/23 1932 08/08/23 2300 08/09/23  0553 08/09/23 0738  BP: (!) 108/90 (!) 91/53 (!) 131/40 (!) 97/47  Pulse:    (!) 106  Resp:  14 12 11   Temp:  98.2 F (36.8 C) 98.6 F (37 C) 97.8 F (36.6 C)  TempSrc:  Oral Oral Axillary  SpO2:  92% 90%   Weight:   80.9 kg   Height:          Data Reviewed:  Basic Metabolic Panel: Recent Labs  Lab 08/03/23 0324 08/04/23 0406 08/05/23 0508 08/06/23 0614 08/07/23 0540 08/08/23 0430 08/09/23 0651  NA 133* 132* 130*  --   --   --   --   K 4.3 4.5 3.6  --   --   --   --   CL 93* 92* 90*  --   --   --   --  CO2 28 25 25   --   --   --   --   GLUCOSE 201* 158* 234*  --   --   --   --   BUN 62* 107* 45*  --   --   --   --   CREATININE 3.82* 5.23* 3.71*  --   --   --   --   CALCIUM 9.0 9.2 8.7*  --   --   --   --   MG 2.0 2.3 2.1 2.5* 2.4 2.8* 2.9*  PHOS 3.5 4.6 4.2  --   --   --   --     CBC: Recent Labs  Lab 08/05/23 0508 08/06/23 0740 08/07/23 0540 08/08/23 0430 08/09/23 0651  WBC 14.2* 16.0* 13.4* 22.3* 12.6*  HGB 7.9* 8.0* 7.9* 7.8* 7.1*  HCT 25.3* 26.3* 25.7* 26.3* 23.1*  MCV 96.2 96.3 97.3 97.4 95.9  PLT 331 355 302 271 265    LFT No results for input(s): "AST", "ALT", "ALKPHOS", "BILITOT", "PROT", "ALBUMIN" in the last 168 hours.   Antibiotics: Anti-infectives (From admission, onward)    Start     Dose/Rate Route Frequency Ordered Stop   08/02/23 1200  vancomycin (VANCOREADY) IVPB 750 mg/150 mL        750 mg 150 mL/hr over 60 Minutes Intravenous Every M-W-F (Hemodialysis) 07/31/23 1404     07/30/23 1800  vancomycin (VANCOREADY) IVPB 750 mg/150 mL        750 mg 150 mL/hr over 60 Minutes Intravenous Every M-W-F (Hemodialysis) 07/30/23 1358 07/30/23 1923   07/29/23 0812  vancomycin variable dose per unstable renal function (pharmacist dosing)  Status:  Discontinued         Does not apply See admin instructions 07/29/23 0813 07/31/23 1404   07/28/23 1000  vancomycin (VANCOREADY) IVPB 750 mg/150 mL  Status:  Discontinued        750 mg 150 mL/hr over  60 Minutes Intravenous Every 24 hours 07/27/23 1539 07/29/23 0813   07/27/23 1630  vancomycin (VANCOREADY) IVPB 500 mg/100 mL        500 mg 100 mL/hr over 60 Minutes Intravenous  Once 07/27/23 1539 07/27/23 1759   07/23/23 1945  ceFEPIme (MAXIPIME) 2 g in sodium chloride 0.9 % 100 mL IVPB  Status:  Discontinued        2 g 200 mL/hr over 30 Minutes Intravenous Every 12 hours 07/23/23 1856 07/27/23 1053   07/21/23 1200  vancomycin (VANCOREADY) IVPB 750 mg/150 mL  Status:  Discontinued        750 mg 150 mL/hr over 60 Minutes Intravenous Every M-W-F (Hemodialysis) 07/19/23 1106 07/20/23 0842   07/21/23 0230  vancomycin (VANCOREADY) IVPB 750 mg/150 mL  Status:  Discontinued        750 mg 150 mL/hr over 60 Minutes Intravenous Every 24 hours 07/20/23 1451 07/27/23 1314   07/20/23 2200  ceFEPIme (MAXIPIME) 2 g in sodium chloride 0.9 % 100 mL IVPB  Status:  Discontinued        2 g 200 mL/hr over 30 Minutes Intravenous Every 12 hours 07/20/23 1451 07/21/23 1108   07/19/23 2200  ceFEPIme (MAXIPIME) 1 g in sodium chloride 0.9 % 100 mL IVPB  Status:  Discontinued        1 g 200 mL/hr over 30 Minutes Intravenous Every 24 hours 07/19/23 1409 07/20/23 1451   07/19/23 2000  ceFEPIme (MAXIPIME) 2 g in sodium chloride 0.9 % 100 mL IVPB  Status:  Discontinued  2 g 200 mL/hr over 30 Minutes Intravenous Every M-W-F 07/19/23 0110 07/19/23 1409   07/19/23 0200  ceFEPIme (MAXIPIME) 2 g in sodium chloride 0.9 % 100 mL IVPB        2 g 200 mL/hr over 30 Minutes Intravenous  Once 07/19/23 0110 07/19/23 0221   07/18/23 1015  vancomycin (VANCOREADY) IVPB 750 mg/150 mL        750 mg 150 mL/hr over 60 Minutes Intravenous  Once 07/18/23 0929 07/19/23 1137   07/16/23 0000  ceFAZolin (ANCEF) IVPB 2g/100 mL premix        2 g 200 mL/hr over 30 Minutes Intravenous To Radiology 07/15/23 1408 07/16/23 1250   07/14/23 1200  vancomycin (VANCOREADY) IVPB 750 mg/150 mL  Status:  Discontinued        750 mg 150 mL/hr over  60 Minutes Intravenous Every M-W-F (Hemodialysis) 07/12/23 1203 07/19/23 1106   07/11/23 2330  vancomycin (VANCOREADY) IVPB 750 mg/150 mL        750 mg 150 mL/hr over 60 Minutes Intravenous  Once 07/11/23 2030 07/12/23 0110   07/11/23 1800  vancomycin (VANCOREADY) IVPB 750 mg/150 mL  Status:  Discontinued        750 mg 150 mL/hr over 60 Minutes Intravenous  Once 07/11/23 0856 07/11/23 1810   07/10/23 2318  vancomycin variable dose per unstable renal function (pharmacist dosing)  Status:  Discontinued         Does not apply See admin instructions 07/10/23 2319 07/12/23 1203   07/10/23 1429  vancomycin (VANCOREADY) IVPB 750 mg/150 mL  Status:  Discontinued        750 mg 150 mL/hr over 60 Minutes Intravenous Every Dialysis 07/10/23 1429 07/11/23 2030   07/10/23 1230  fidaxomicin (DIFICID) tablet 200 mg  Status:  Discontinued        200 mg Oral 2 times daily 07/10/23 1139 07/10/23 1207   07/10/23 1000  piperacillin-tazobactam (ZOSYN) IVPB 2.25 g  Status:  Discontinued        2.25 g 100 mL/hr over 30 Minutes Intravenous Every 8 hours 07/10/23 0908 07/10/23 1350   07/09/23 2330  vancomycin (VANCOREADY) IVPB 1750 mg/350 mL        1,750 mg 175 mL/hr over 120 Minutes Intravenous  Once 07/09/23 2329 07/10/23 0734   07/09/23 2330  piperacillin-tazobactam (ZOSYN) IVPB 3.375 g        3.375 g 100 mL/hr over 30 Minutes Intravenous  Once 07/09/23 2329 07/10/23 0240        DVT prophylaxis: Heparin  Code Status: Full code  Family Communication: Discussed with patient's mother on phone   CONSULTS PCCM, nephrology, ID, CT surgery   Subjective   Blood glucose was elevated above 400 yesterday, tube feed was held.  Objective    Physical Examination:  Appears in no acute distress Somnolent but arousable Lungs clear to auscultation bilaterally Abdomen is soft, nontender, no organomegaly   Status is: Inpatient:      Pressure Injury 08/03/23 Buttocks Left Stage 2 -  Partial thickness  loss of dermis presenting as a shallow open injury with a red, pink wound bed without slough. Several small open areas, right buttocks area (Active)  08/03/23 1945  Location: Buttocks  Location Orientation: Left  Staging: Stage 2 -  Partial thickness loss of dermis presenting as a shallow open injury with a red, pink wound bed without slough.  Wound Description (Comments): Several small open areas, right buttocks area  Present on Admission:  Meredeth Ide   [facility] If 7PM-7AM, please contact night-coverage at [email or web address], Office  [phone]   08/09/2023, 8:07 AM  LOS: 30 days

## 2023-08-09 NOTE — Progress Notes (Signed)
OT Cancellation Note  Patient Details Name: [name] MRN: [id number] DOB: Oct 11, 1980   Cancelled Treatment:    Reason Eval/Treat Not Completed: Patient at procedure or test/ unavailable. Transport here to get pt for dialysis after OT entering room. Will return as schedule allows.   Tyler Deis, OTR/L [facility] Acute Rehabilitation Office: [phone]   [name] 08/09/2023, 2:01 PM

## 2023-08-09 NOTE — Progress Notes (Signed)
[facility]  Date of Admission:  07/09/2023     Antibiotics  Vanc 07/10/23 >> c         ASSESSMENT: Mr. Schillo has been inpatient since 07/09/23 being treated with vanc for MRSA bacteremia and endocarditis.   His fevers have been down trending until 11/9 when he had a fever of 102.8. He is afebrile on exam. He has been receiving regular tylenol. WBC 12.6 this AM. He has a left femoral HD cath and a right femoral central line. Last blood cultures from 10/25 are negative at 5 days. He continues to have intermittent fevers. The femoral lines may be harboring bacteria. Removing the central line if possible may be beneficial as well as exchanging the HD cath. Fevers may also be related to endocarditis. Continue current dose of vancomycin with HD. Remaining medical and supportive care per Internal Medicine.   PLAN:  Continue Vanc IV, monitor vanc trough  Remove/Line Holiday of central line if possible Consider exchange of HD cath if fevers continue. Remaining medical and supportive care per Internal Medicine.   I have personally spent 30 minutes involved in face-to-face and non-face-to-face activities for this patient on the day of the visit. Professional time spent includes the following activities: Preparing to see the patient (review of tests), Obtaining and/or reviewing separately obtained history (admission/discharge record), Performing a medically appropriate examination and/or evaluation , Ordering medications/tests/procedures, referring and communicating with other health care professionals, Documenting clinical information in the EMR, Independently interpreting results (not separately reported), Communicating results to the patient/family/caregiver, Counseling and educating the patient/family/caregiver and Care coordination (not separately reported).    Principal Problem:   Endocarditis of mitral valve Active Problems:   Insulin dependent type 1 diabetes  mellitus ([name])   Diabetic gastroparesis ([name])   Septic shock ([name])   Anemia of chronic disease   Iron deficiency anemia, unspecified   Secondary hyperparathyroidism of renal origin ([name])   Thrombocytopenia ([name])   Acute metabolic encephalopathy   MRSA bacteremia   Right elbow pain   ESRD on hemodialysis ([name])   Acute systolic CHF (congestive heart failure) ([name])   DCM (dilated cardiomyopathy) ([name])   Coronary artery calcification seen on CAT scan   Infective endocarditis of tricuspid valve   Protein-calorie malnutrition, severe   Cavitary pneumonia   Perforation of leaflet of mitral valve   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)   Hemiparesis affecting left side as late effect of stroke ([name])   Dysphagia due to recent stroke   Cardiogenic shock ([name])   Acute blood loss anemia   Hx of AKA (above knee amputation), left ([name])   Subclavian vein occlusion, bilateral ([name])   Fatty liver   Liver mass   Physical deconditioning   Cerebrovascular accident (CVA) due to embolism of cerebral artery ([name])    apixaban  5 mg Oral BID   calcitRIOL  1.5 mcg Per Tube Q M,W,F   Chlorhexidine Gluconate Cloth  6 each Topical Q0600   [START ON 08/13/2023] darbepoetin (ARANESP) injection - DIALYSIS  100 mcg Subcutaneous Q Fri-1800   feeding supplement (NEPRO CARB STEADY)  1,000 mL Per Tube Q24H   feeding supplement (PROSource TF20)  60 mL Per Tube Daily   fiber supplement (BANATROL TF)  60 mL Per Tube BID   gabapentin  300 mg Per Tube QHS   insulin aspart  0-5 Units Subcutaneous QHS   insulin aspart  0-9 Units Subcutaneous TID WC   insulin aspart  10 Units Subcutaneous TID WC   insulin glargine-yfgn  10 Units Subcutaneous BID   lidocaine  1 Application Topical Once   metoprolol tartrate  12.5 mg Oral BID   midodrine  5 mg Per Tube Q8H   multivitamin  1 tablet Per Tube QHS   nutrition supplement (JUVEN)  1 packet Per Tube BID BM   mouth rinse  15 mL Mouth Rinse 4 times  per day   QUEtiapine  50 mg Per Tube QHS   QUEtiapine  50 mg Per Tube Q0600    SUBJECTIVE: Frustrated because he feels like he is working very hard and making progress but he still cannot go home. Conversation is rambling and tangential. Discussed with patient that his is making progress but the antibiotics still need more time.   No Known Allergies   Review of Systems: Review of Systems  Constitutional:  Negative for chills and fever.  Respiratory: Negative.    Cardiovascular: Negative.   Gastrointestinal:  Negative for abdominal pain.  Musculoskeletal:  Negative for joint pain.  Skin:  Negative for rash.      OBJECTIVE: Vitals:   08/09/23 0738 08/09/23 0800 08/09/23 0959 08/09/23 1000  BP: (!) 97/47 (!) 107/45 (!) 120/106 (!) 111/97  Pulse: (!) 106 100  (!) 111  Resp: 11 14  20   Temp: 97.8 F (36.6 C)     TempSrc: Axillary     SpO2:  94%  95%  Weight:      Height:       Body mass index is 27.93 kg/m.  Physical Exam Cardiovascular:     Rate and Rhythm: Normal rate and regular rhythm.  Pulmonary:     Effort: Pulmonary effort is normal.  Abdominal:     Palpations: Abdomen is soft.  Neurological:     Mental Status: He is alert.  Psychiatric:        Mood and Affect: Affect is blunt.        Speech: Speech is tangential.        Behavior: Behavior is cooperative.        Thought Content: Thought content is paranoid.     Lab Results Lab Results  Component Value Date   WBC 12.6 (H) 08/09/2023   HGB 7.1 (L) 08/09/2023   HCT 23.1 (L) 08/09/2023   MCV 95.9 08/09/2023   PLT 265 08/09/2023    Lab Results  Component Value Date   CREATININE 3.71 (H) 08/05/2023   BUN 45 (H) 08/05/2023   NA 130 (L) 08/05/2023   K 3.6 08/05/2023   CL 90 (L) 08/05/2023   CO2 25 08/05/2023    Lab Results  Component Value Date   ALT 6 07/24/2023   AST 18 07/24/2023   ALKPHOS 135 (H) 07/24/2023   BILITOT 2.9 (H) 07/24/2023     Microbiology: No results found for this or any  previous visit (from the past 240 hour(s)).  Deniece Ree RN, Student Nurse Practitioner  Marcos Eke, NP [facility] [phone]

## 2023-08-09 NOTE — Progress Notes (Signed)
Speech Language Pathology Treatment: Dysphagia  Patient Details Name: [name] MRN: [id number] DOB: 1980-12-21 Today's Date: 08/09/2023 Time: 0911-0955 SLP Time Calculation (min) (ACUTE ONLY): 44 min  Assessment / Plan / Recommendation Clinical Impression  [name] was alert, talkative this am.  Restraint released to allow him to self-feed breakfast.  Impairments in initiation and sustained effort are a significant barrier, as pt requires mod verbal/tactile cues for every step of the process of self-feeding.  Without cues, attention ceases and he self-distracts with talking.  He demonstrated improved attention to pocketing in left side and was better able to recognize residue.  There was no coughing with POs and overall, he is tolerating diet well.  Continue dysphagia 1/nectars; assist with self-feeding.  Wrist restraint reapplied before leaving patient. SLP will follow.   HPI HPI: Pt is a 42 yo male presenting 10/11 with AMS. His dialysis catheter was partially removed and he was in DKA. Admitted for septic shock from MRSA bacteremia; infective endocarditis. LUE weakness was noted 10/17 and MRI confirmed sizable infarct in the R MCA branch as well as small acute infarcts in the L occipital lobe and bilateral cerebellum. Pt initially evaluated by SLP 10/18 and recommended to have Dys 3 diet and thin liquids due primarily oral symptoms. Pt had worsening hypoxia and AMS after vomiting on 10/21 requiring intubation 10/21-10/24. 10/22-10/30 CRRT. 11/08 MBS, dysphagia 1/nectar. PMH includes: ESRD, DM, gastroparesis, L AKA, HTN, recent admission in July for sepsis secondary to R diabetic foot ulcer (left AMA)      SLP Plan  Continue with current plan of care      Recommendations for follow up therapy are one component of a multi-disciplinary discharge planning process, led by the attending physician.  Recommendations may be updated based on patient status, additional functional criteria and  insurance authorization.    Recommendations  Diet recommendations: Dysphagia 1 (puree);Nectar-thick liquid Liquids provided via: Cup;Straw Medication Administration: Via alternative means Supervision: Full supervision/cueing for compensatory strategies;Patient able to self feed;Staff to assist with self feeding Compensations: Minimize environmental distractions;Slow rate;Small sips/bites;Lingual sweep for clearance of pocketing;Monitor for anterior loss Postural Changes and/or Swallow Maneuvers: Seated upright 90 degrees                  Oral care BID   Frequent or constant Supervision/Assistance Dysphagia, oropharyngeal phase (R13.12)     Continue with current plan of care    [name] L. Samson Frederic, MA CCC/SLP Clinical Specialist - Acute Care SLP Acute Rehabilitation Services Office number [phone]  [name]  08/09/2023, 10:05 AM

## 2023-08-10 DIAGNOSIS — R4182 Altered mental status, unspecified: Secondary | ICD-10-CM | POA: Diagnosis not present

## 2023-08-10 DIAGNOSIS — D62 Acute posthemorrhagic anemia: Secondary | ICD-10-CM | POA: Diagnosis not present

## 2023-08-10 DIAGNOSIS — I059 Rheumatic mitral valve disease, unspecified: Secondary | ICD-10-CM | POA: Diagnosis not present

## 2023-08-10 DIAGNOSIS — D638 Anemia in other chronic diseases classified elsewhere: Secondary | ICD-10-CM

## 2023-08-10 DIAGNOSIS — I69391 Dysphagia following cerebral infarction: Secondary | ICD-10-CM | POA: Diagnosis not present

## 2023-08-10 LAB — GLUCOSE, CAPILLARY
Glucose-Capillary: 178 mg/dL — ABNORMAL HIGH (ref 70–99)
Glucose-Capillary: 167 mg/dL — ABNORMAL HIGH (ref 70–99)
Glucose-Capillary: 209 mg/dL — ABNORMAL HIGH (ref 70–99)
Glucose-Capillary: 172 mg/dL — ABNORMAL HIGH (ref 70–99)
Glucose-Capillary: 62 mg/dL — ABNORMAL LOW (ref 70–99)
Glucose-Capillary: 94 mg/dL (ref 70–99)

## 2023-08-10 LAB — MAGNESIUM: Magnesium: 2.6 mg/dL — ABNORMAL HIGH (ref 1.7–2.4)

## 2023-08-10 MED ORDER — INSULIN ASPART 100 UNIT/ML IJ SOLN
3.0000 [IU] | Freq: Three times a day (TID) | INTRAMUSCULAR | Status: DC
  Administered 2023-08-11 – 2023-08-19 (×15): 3 [IU] via SUBCUTANEOUS

## 2023-08-10 MED ORDER — INSULIN GLARGINE-YFGN 100 UNIT/ML ~~LOC~~ SOLN
10.0000 [IU] | Freq: Every day | SUBCUTANEOUS | Status: DC
  Administered 2023-08-11: 10 [IU] via SUBCUTANEOUS
  Filled 2023-08-10 (×2): qty 0.1

## 2023-08-10 MED ORDER — MIDODRINE HCL 5 MG PO TABS: 10.0000 mg | ORAL_TABLET | ORAL | Status: DC

## 2023-08-10 MED ORDER — CHLORHEXIDINE GLUCONATE CLOTH 2 % EX PADS
6.0000 | MEDICATED_PAD | Freq: Every day | CUTANEOUS | Status: DC
  Administered 2023-08-11 – 2023-08-15 (×5): 6 via TOPICAL

## 2023-08-10 MED ORDER — INSULIN ASPART 100 UNIT/ML IJ SOLN
5.0000 [IU] | Freq: Three times a day (TID) | INTRAMUSCULAR | Status: DC
  Administered 2023-08-10 (×2): 5 [IU] via SUBCUTANEOUS

## 2023-08-10 MED ORDER — DEXTROSE 50 % IV SOLN
12.5000 g | INTRAVENOUS | Status: AC
Start: 2023-08-10 — End: 2023-08-10
  Administered 2023-08-10: 12.5 g via INTRAVENOUS

## 2023-08-10 MED ORDER — DEXTROSE 50 % IV SOLN
INTRAVENOUS | Status: AC
  Filled 2023-08-10: qty 50

## 2023-08-10 NOTE — Progress Notes (Signed)
Pharmacy Antibiotic Note  [name] is a 42 y.o. 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 Pharmacy has been consulted for Vancomycin dosing.  Patient has been receiving dialysis on a regular MWF schedule with next dialysis planned for 11/13.   Plan: - Vancomycin 750 mg IV q HD - Will monitor HD tolerance and dosing - Will collect repeat pre-HD level 11/15  - Follow-up duration of treatment   Height: 5\' 7"  (170.2 cm) Weight: 81.8 kg (180 lb 5.4 oz) IBW/kg (Calculated) : 66.1  Temp (24hrs), Avg:99 F (37.2 C), Min:98.5 F (36.9 C), Max:99.3 F (37.4 C)  Recent Labs  Lab 08/04/23 0406 08/05/23 0508 08/06/23 0740 08/06/23 0831 08/07/23 0540 08/08/23 0430 08/09/23 0651 08/09/23 1130  WBC 16.9* 14.2* 16.0*  --  13.4* 22.3* 12.6*  --   CREATININE 5.23* 3.71*  --   --   --   --   --  6.46*  VANCORANDOM  --   --   --  22  --   --   --   --     Estimated Creatinine Clearance: 15.1 mL/min (A) (by C-G formula based on SCr of 6.46 mg/dL (H)).    No Known Allergies  Antimicrobials this admission: Vancomycin 10/12 >> Cefepime 10/21 >> 10/23; restart 10/25 >> 10/29  Microbiology results: 10/12 MRSA PCR >> positive 10/12 CDiff antigen +, toxin -, PCR - >> negative 10/12 BCx >> 4/4 MRSA 10/13 BCx >> NGf 10/17 BCx >> NGf 10/21 RCx >> NOF  Thank you for allowing pharmacy to be a part of this patient's care.  Sharin Mons, PharmD, BCPS, BCIDP Infectious Diseases Clinical Pharmacist Phone: [phone] 08/10/2023 2:29 PM
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
## 2023-08-10 NOTE — Progress Notes (Signed)
[facility] Progress Note  Subjective: seen in room, no c/o's  Vitals:   08/10/23 0514 08/10/23 0600 08/10/23 0631 08/10/23 0740  BP: (!) 130/115 128/76 (!) 130/45 (!) 110/45  Pulse: (!) 120  (!) 121 (!) 112  Resp: 10  16 16   Temp: 98.5 F (36.9 C)  99.3 F (37.4 C) 99 F (37.2 C)  TempSrc: Oral  Oral Axillary  SpO2: 95%  95% 95%  Weight:      Height:        Exam: Gen: Awake, alert, NG tube on  CVS: Regular tachycardia, S1 and S2 Resp: Transmitted breath sounds, no distinct rales or rhonchi, [place] O2 Abd: Soft, flat, nontender, bowel sounds normal Ext: Status post left AKA, no edema right leg. Bilat UE nonpitting edema.  Access: Left femoral TDC in place    Renal-related home meds: - sensipar 120 at bedtime - gabapentin 100mg  - renvavite - renvela 3 ac tid    OP HD: MWF [facility] 4h  400/1.5    77kg   2/3 bath  [facility] TDC    Heparin none - last OP HD 10/7, post wt 74.6kg - misses HD once every other week approx - rocaltrol 1.50 mcg    Assessment/ Plan: MRSA bacteremia with mitral valve endocarditis: Suspected to be related to catheter related bacteremia. SP line holiday w/ old TDC removed 10/16 and new TDC placed 10/18 by IR.  Remains on intravenous vancomycin. Options being explored for transfer to tertiary center for mitral valve repair. 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  ESRD: on HD MWF. SP course of CRRT 10/22- 10/30. Not suitable for outpatient hemodialysis at this time. Next HD Wed. Volume: on 2-4 lpm [place] O2. Some UE edema. Lower vol w/ HD as tolerated.  HD access: SP line holiday with new L  femoral TDC placed on 10/18. 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  Anemia: Hb 7- 9 range. ESA dose increased for darbepoetin to 100 mcg q. weekly. 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 Monitor on renal diet.  On calcitriol for PTH  control. Nutrition: On dysphagia 1 diet following swallow evaluation and ongoing tube feeds. Hypotension: Weaned off of pressors. BP's soft on iHD yest. Keep midodrine for now at 5 tid. Give additional 10mg  pre HD mwf.      Vinson Moselle MD  [facility] 08/10/2023, 11:35 AM  Recent Labs  Lab 08/05/23 0508 08/06/23 0740 08/08/23 0430 08/09/23 0651 08/09/23 1130  HGB 7.9*   < > 7.8* 7.1*  --   ALBUMIN  --   --   --   --  2.0*  CALCIUM 8.7*  --   --   --  9.0  PHOS 4.2  --   --   --  4.7*  CREATININE 3.71*  --   --   --  6.46*  K 3.6  --   --   --  4.4   < > = values in this interval not displayed.   No results for input(s): "IRON", "TIBC", "FERRITIN" in the last 168 hours. Inpatient medications:  apixaban  5 mg Oral BID   calcitRIOL  1.5 mcg Per Tube Q M,W,F   Chlorhexidine Gluconate Cloth  6 each Topical Q0600   [START ON 08/13/2023] darbepoetin (ARANESP) injection - DIALYSIS  100 mcg Subcutaneous Q Fri-1800   feeding supplement (NEPRO CARB STEADY)  1,000 mL Per Tube Q24H  feeding supplement (NEPRO CARB STEADY)  237 mL Oral TID WC   feeding supplement (PROSource TF20)  60 mL Per Tube Daily   fiber supplement (BANATROL TF)  60 mL Per Tube BID   gabapentin  300 mg Per Tube QHS   insulin aspart  0-5 Units Subcutaneous QHS   insulin aspart  0-9 Units Subcutaneous TID WC   insulin aspart  5 Units Subcutaneous TID WC   insulin glargine-yfgn  10 Units Subcutaneous BID   lidocaine  1 Application Topical Once   metoprolol tartrate  12.5 mg Oral BID   midodrine  5 mg Per Tube Q8H   multivitamin  1 tablet Per Tube QHS   nutrition supplement (JUVEN)  1 packet Per Tube BID BM   mouth rinse  15 mL Mouth Rinse 4 times per day   QUEtiapine  50 mg Per Tube QHS   QUEtiapine  50 mg Per Tube Q0600    vancomycin Stopped (08/09/23 1824)   acetaminophen (TYLENOL) oral liquid 160 mg/5 mL, albuterol, clonazePAM, Gerhardt's butt cream, haloperidol lactate, oxyCODONE **OR** HYDROmorphone (DILAUDID)  injection, mouth rinse

## 2023-08-10 NOTE — Progress Notes (Signed)
Occupational Therapy Treatment Patient Details Name: [name] MRN: [id number] DOB: 02/19/1981 Today's Date: 08/10/2023   History of present illness Patient is a 42 y/o male admitted 07/09/23 with confusion found to be in septic shock found to have MRSA in blood cultures, in DKA, TEE positive for vegitation on multiple valves, MRI showing R MCA infarct.  Pt with resp failure on 10/21 resulting in intubation and transfer to [facility]; extubated 10/24.  CRRT started 10/22-stopped 10/30.  PMH positive for ESRD on HD, IDDM, gastroparesis, L AKA, HTN.   OT comments  Pt seen for LUE splint this session. Provided PROM to LUE. With max education and cues, pt able to wash hands at midline; pt with very limited awareness sof L side of body and does not realize which arm is his vs therapist without max cues. Resting hand splint applied. Wear schedule for 4 hours on/4 hours off.       If plan is discharge home, recommend the following:  Two people to help with walking and/or transfers;Two people to help with bathing/dressing/bathroom;Assistance with cooking/housework;Assistance with feeding;Help with stairs or ramp for entrance;Assist for transportation;Direct supervision/assist for financial management;Direct supervision/assist for medications management   Equipment Recommendations  Other (comment) (defer)    Recommendations for Other Services      Precautions / Restrictions Precautions Precautions: Fall Precaution Comments: LAKA, L hemiparesis, R wrist restraint, flexiseal, coretrak, blood clot LUE       Mobility Bed Mobility                    Transfers                         Balance                                           ADL either performed or assessed with clinical judgement   ADL Overall ADL's : Needs assistance/impaired     Grooming: Oral care;Bed level;Minimal assistance                                 General ADL Comments:  focus of session on LUE ROM and implementation of resting hand splint and wear schedule    Extremity/Trunk Assessment Upper Extremity Assessment Upper Extremity Assessment: Left hand dominant LUE Deficits / Details: No active movement LUE Sensation: decreased light touch LUE Coordination: decreased fine motor;decreased gross motor   Lower Extremity Assessment Lower Extremity Assessment: Defer to PT evaluation        Vision       Perception     Praxis      Cognition Arousal: Alert Behavior During Therapy: Flat affect Overall Cognitive Status: Difficult to assess Area of Impairment: Orientation, Attention, Memory, Following commands, Safety/judgement, Awareness, Problem solving                 Orientation Level: Disoriented to, Time Current Attention Level: Focused Memory: Decreased short-term memory Following Commands: Follows one step commands inconsistently, Follows one step commands with increased time Safety/Judgement: Decreased awareness of deficits, Decreased awareness of safety Awareness: Intellectual Problem Solving: Slow processing, Decreased initiation, Difficulty sequencing, Requires verbal cues, Requires tactile cues General Comments: Pt very verbose this session; observed to perseverate and repeat sentences multiple times after being provided an answer and intently  waiting to be answered again. Good eye contact during conversation and tracking therapist to L and R. pt needing max cues to follow commands at times, and with no awareness of L side of body unless provided max support (max cues to reach for LUE with RUE        Exercises Exercises: Other exercises Other Exercises Other Exercises: PROM L UE elbow/wrist/hand. shoulder D2 pattern, elbow flex/ext; pron/sup and wrist & finger flex/ext; noted edema throughout and propped on pillow and towels Other Exercises: donned L restring hand splint after PROM and with good fit. Requested RN to perofrm skin check  prior to the end of her shift and to don for sleeping    Shoulder Instructions       General Comments      Pertinent Vitals/ Pain       Pain Assessment Pain Assessment: Faces Faces Pain Scale: No hurt  Home Living                                          Prior Functioning/Environment              Frequency  Min 2X/week        Progress Toward Goals  OT Goals(current goals can now be found in the care plan section)  Progress towards OT goals: Progressing toward goals  Acute Rehab OT Goals Patient Stated Goal: none stated OT Goal Formulation: With patient/family Time For Goal Achievement: 08/16/23 Potential to Achieve Goals: Good ADL Goals Pt Will Perform Grooming: with min assist;sitting Pt Will Perform Lower Body Bathing: with mod assist;sit to/from stand Pt Will Perform Lower Body Dressing: sitting/lateral leans;with max assist Pt Will Transfer to Toilet: with max assist;with transfer board;bedside commode Pt Will Perform Toileting - Clothing Manipulation and hygiene: with mod assist;sitting/lateral leans Additional ADL Goal #1: Pt will follow all one step commands with no more than min cues  Plan      Co-evaluation                 AM-PAC OT "6 Clicks" Daily Activity     Outcome Measure   Help from another person eating meals?: A Lot Help from another person taking care of personal grooming?: A Lot Help from another person toileting, which includes using toliet, bedpan, or urinal?: Total Help from another person bathing (including washing, rinsing, drying)?: Total Help from another person to put on and taking off regular upper body clothing?: Total Help from another person to put on and taking off regular lower body clothing?: Total 6 Click Score: 8    End of Session    OT Visit Diagnosis: Other abnormalities of gait and mobility (R26.89);Other symptoms and signs involving the nervous system (R29.898);Hemiplegia and  hemiparesis;Muscle weakness (generalized) (M62.81);Other symptoms and signs involving cognitive function Hemiplegia - Right/Left: Left Hemiplegia - caused by: Cerebral infarction   Activity Tolerance Patient tolerated treatment well   Patient Left in bed;with call bell/phone within reach;with bed alarm set   Nurse Communication          Time: 8756-4332 OT Time Calculation (min): 25 min  Charges: OT General Charges $OT Visit: 1 Visit OT Treatments $Therapeutic Activity: 8-22 mins  Tyler Deis, OTR/L [facility] Acute Rehabilitation Office: [phone]   [name] 08/10/2023, 5:45 PM

## 2023-08-10 NOTE — Progress Notes (Signed)
Offered to feed during meal time, but refused.

## 2023-08-10 NOTE — Plan of Care (Signed)
  Problem: Clinical Measurements: Goal: Diagnostic test results will improve Outcome: Progressing Goal: Respiratory complications will improve Outcome: Progressing Goal: Cardiovascular complication will be avoided Outcome: Progressing   Problem: Coping: Goal: Level of anxiety will decrease Outcome: Progressing   Problem: Pain Management: Goal: General experience of comfort will improve Outcome: Progressing   Problem: Safety: Goal: Ability to remain free from injury will improve Outcome: Progressing

## 2023-08-10 NOTE — Progress Notes (Signed)
Bleeding noted on the right groin central line site in mod amount. Cleansed and linen changed. No further bleeding noted.

## 2023-08-10 NOTE — Progress Notes (Signed)
Hypoglycemic Event  CBG: 62  Treatment: D50 25 mL (12.5 gm)  Symptoms: None  Follow-up CBG: Time:2133 CBG Result:94  Possible Reasons for Event: Inadequate meal intake  Comments/MD notified:see new orders - long and short acting insulin adjusted    Roxy Manns  RN BSN

## 2023-08-10 NOTE — Progress Notes (Signed)
OT Cancellation Note  Patient Details Name: [name] MRN: [id number] DOB: 1981/06/05   Cancelled Treatment:    Reason Eval/Treat Not Completed: Medical issues which prohibited therapy (Per RN just pulled femoral/groin central line. Requesting pt to rest for now and return later. Will follow up this afternoon as able.)  Tyler Deis, OTR/L [facility] Acute Rehabilitation Office: [phone]   [name] 08/10/2023, 2:09 PM

## 2023-08-10 NOTE — Progress Notes (Signed)
RN and NT both offered to help patient eat dinner tray at separate times.  Patient declined stating he did not feel hungry tonight.  Patient took a few sips of thickened nepro but unable to drink any more.

## 2023-08-11 LAB — COMPREHENSIVE METABOLIC PANEL
ALT: 14 U/L (ref 0–44)
AST: 21 U/L (ref 15–41)
Albumin: 2 g/dL — ABNORMAL LOW (ref 3.5–5.0)
Alkaline Phosphatase: 173 U/L — ABNORMAL HIGH (ref 38–126)
Anion gap: 11 (ref 5–15)
BUN: 65 mg/dL — ABNORMAL HIGH (ref 6–20)
CO2: 27 mmol/L (ref 22–32)
Calcium: 9.4 mg/dL (ref 8.9–10.3)
Chloride: 90 mmol/L — ABNORMAL LOW (ref 98–111)
Creatinine, Ser: 6.04 mg/dL — ABNORMAL HIGH (ref 0.61–1.24)
GFR, Estimated: 11 mL/min — ABNORMAL LOW (ref >60)
Glucose, Bld: 191 mg/dL — ABNORMAL HIGH (ref 70–99)
Potassium: 4.2 mmol/L (ref 3.5–5.1)
Sodium: 128 mmol/L — ABNORMAL LOW (ref 135–145)
Total Bilirubin: 0.7 mg/dL (ref <1.2)
Total Protein: 6.8 g/dL (ref 6.5–8.1)

## 2023-08-11 LAB — GLUCOSE, CAPILLARY
Glucose-Capillary: 93 mg/dL (ref 70–99)
Glucose-Capillary: 169 mg/dL — ABNORMAL HIGH (ref 70–99)
Glucose-Capillary: 262 mg/dL — ABNORMAL HIGH (ref 70–99)
Glucose-Capillary: 283 mg/dL — ABNORMAL HIGH (ref 70–99)

## 2023-08-11 LAB — CBC WITH DIFFERENTIAL/PLATELET
Abs Immature Granulocytes: 0.07 10*3/uL (ref 0.00–0.07)
Basophils Absolute: 0 10*3/uL (ref 0.0–0.1)
Basophils Relative: 0 %
Eosinophils Absolute: 0.5 10*3/uL (ref 0.0–0.5)
Eosinophils Relative: 4 %
HCT: 24.6 % — ABNORMAL LOW (ref 39.0–52.0)
Hemoglobin: 7.5 g/dL — ABNORMAL LOW (ref 13.0–17.0)
Immature Granulocytes: 1 %
Lymphocytes Relative: 11 %
Lymphs Abs: 1.3 10*3/uL (ref 0.7–4.0)
MCH: 29.3 pg (ref 26.0–34.0)
MCHC: 30.5 g/dL (ref 30.0–36.0)
MCV: 96.1 fL (ref 80.0–100.0)
Monocytes Absolute: 0.7 10*3/uL (ref 0.1–1.0)
Monocytes Relative: 6 %
Neutro Abs: 9.7 10*3/uL — ABNORMAL HIGH (ref 1.7–7.7)
Neutrophils Relative %: 78 %
Platelets: 294 10*3/uL (ref 150–400)
RBC: 2.56 MIL/uL — ABNORMAL LOW (ref 4.22–5.81)
RDW: 16.9 % — ABNORMAL HIGH (ref 11.5–15.5)
WBC: 12.2 10*3/uL — ABNORMAL HIGH (ref 4.0–10.5)
nRBC: 0 % (ref 0.0–0.2)

## 2023-08-11 LAB — MAGNESIUM: Magnesium: 3 mg/dL — ABNORMAL HIGH (ref 1.7–2.4)

## 2023-08-11 MED ORDER — MIDODRINE HCL 5 MG PO TABS: 2.5000 mg | ORAL_TABLET | Freq: Three times a day (TID) | ORAL | Status: DC

## 2023-08-11 MED ORDER — LOPERAMIDE HCL 2 MG PO CAPS: 2.0000 mg | ORAL_CAPSULE | ORAL | Status: DC | PRN

## 2023-08-11 MED ORDER — MIDODRINE HCL 5 MG PO TABS: 10.0000 mg | ORAL_TABLET | ORAL | Status: DC

## 2023-08-11 MED ORDER — HEPARIN SODIUM (PORCINE) 1000 UNIT/ML IJ SOLN
INTRAMUSCULAR | Status: AC
  Filled 2023-08-11: qty 5

## 2023-08-11 MED ORDER — METOPROLOL TARTRATE 12.5 MG HALF TABLET
12.5000 mg | ORAL_TABLET | Freq: Two times a day (BID) | ORAL | Status: DC
  Administered 2023-08-11 – 2023-08-13 (×4): 12.5 mg
  Filled 2023-08-11 (×4): qty 1

## 2023-08-11 MED ORDER — APIXABAN 5 MG PO TABS
5.0000 mg | ORAL_TABLET | Freq: Two times a day (BID) | ORAL | Status: DC
  Administered 2023-08-11 – 2023-08-13 (×4): 5 mg
  Filled 2023-08-11 (×4): qty 1

## 2023-08-11 NOTE — Progress Notes (Signed)
PT Cancellation Note  Patient Details Name: ORVILL EDE MRN: [id number] DOB: 11-28-1980   Cancelled Treatment:    Reason Eval/Treat Not Completed: Patient at procedure or test/unavailable (pt off unit at HD, will follow up at later date/time as schedule allows and pt able.)   Renaldo Fiddler PT, DPT Acute Rehabilitation Services Office [phone]  08/11/23 10:32 AM

## 2023-08-11 NOTE — Progress Notes (Signed)
OT NOTE  RN STAFF  Please check splint every 4 hours during shift ( remove splint ) to assess for: * pain * redness *swelling  If any symptoms above present remove splint for 15 minutes. If symptoms continue - keep the splint removed and notify OT staff [phone] immediately.   Keep the UE elevated at all times on pillows / towels.  Splint should have two splint covers (blue cloth) with a mesh bag for cleaning. The splint cover (blue cloth) should be washed with soapy water and hung out to dry or washed on delicate in home washer. Do not throw away splint cover it is washable. Please have a set location to store the splints in patients room for daily application.    Splints are to be worn for 4 hours and off for 2 hours  Examples of schedule:  Splints off at 6am Splints on at 8 am Splints off at 12 pm Splints on at 2 pm Splints off at 6pm Splints on at 8 pm    To place the splint on:  1. Place the wrist in position first and secure strap 2. Position each digit and apply strap 3. The thumb and forearm strap should be applied last   The splints should fit as appeared here Strap over the PIP joint of finger Strap over the MCP ( knuckles)  joints of the hand Strap at the thumb Strap at the wrist Strap at the forearm

## 2023-08-11 NOTE — Progress Notes (Signed)
[facility] Progress Note  Subjective: seen in room, no c/o's  Vitals:   08/11/23 1200 08/11/23 1206 08/11/23 1221 08/11/23 1303  BP: 138/61 138/78 137/87   Pulse: (!) 122 (!) 122 (!) 124   Resp: 11 12 16    Temp:    99.5 F (37.5 C)  TempSrc:      SpO2: 100% 100% 100%   Weight:    77.5 kg  Height:        Exam: Gen: Awake, alert, NG tube on  CVS: Regular tachycardia, S1 and S2 Resp: Transmitted breath sounds, no distinct rales or rhonchi, [place] O2 Abd: Soft, flat, nontender, bowel sounds normal Ext: Status post left AKA, no edema right leg. Bilat UE nonpitting edema.  Access: Left femoral TDC in place    Renal-related home meds: - sensipar 120 at bedtime - gabapentin 100mg  - renvavite - renvela 3 ac tid    OP HD: MWF [facility] 4h  400/1.5    77kg   2/3 bath  [facility] TDC    Heparin none - last OP HD 10/7, post wt 74.6kg - misses HD once every other week approx - rocaltrol 1.50 mcg    Assessment/ Plan: MRSA bacteremia with mitral valve endocarditis: Suspected to be related to catheter related bacteremia. SP line holiday w/ old TDC removed 10/16 and new TDC placed 10/18 by IR.  Remains on intravenous vancomycin. Options being explored for transfer to tertiary center for mitral valve repair. 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  ESRD: on HD MWF. SP course of CRRT 10/22- 10/30. Next HD today. Volume: on 2-4 lpm  O2. Bilat UE edema. Lower vol w/ HD as tolerated.  HD access: SP line holiday with new L  femoral TDC placed on 10/18. 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  Anemia: Hb 7- 9 range. ESA dose increased to 100 mcg q. weekly. 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 Monitor on renal diet.  On calcitriol for PTH control. Nutrition: On dysphagia 1 diet following swallow evaluation and ongoing tube feeds. Hypotension: Weaned off of pressors. Lower  midodrine to 25 tid. Give additional 10mg  pre HD mwf.      Vinson Moselle MD  [facility] 08/11/2023, 3:39 PM  Recent Labs  Lab 08/05/23 0508 08/06/23 0740 08/09/23 0651 08/09/23 1130 08/11/23 0906  HGB 7.9*   < > 7.1*  --  7.5*  ALBUMIN  --   --   --  2.0* 2.0*  CALCIUM 8.7*  --   --  9.0 9.4  PHOS 4.2  --   --  4.7*  --   CREATININE 3.71*  --   --  6.46* 6.04*  K 3.6  --   --  4.4 4.2   < > = values in this interval not displayed.   No results for input(s): "IRON", "TIBC", "FERRITIN" in the last 168 hours. Inpatient medications:  apixaban  5 mg Oral BID   calcitRIOL  1.5 mcg Per Tube Q M,W,F   Chlorhexidine Gluconate Cloth  6 each Topical Q0600   [START ON 08/13/2023] darbepoetin (ARANESP) injection - DIALYSIS  100 mcg Subcutaneous Q Fri-1800   feeding supplement (NEPRO CARB STEADY)  1,000 mL Per Tube Q24H   feeding supplement (NEPRO CARB STEADY)  237 mL Oral TID WC   feeding supplement (PROSource TF20)  60 mL Per Tube Daily   fiber supplement (BANATROL TF)  60 mL  Per Tube BID   gabapentin  300 mg Per Tube QHS   heparin sodium (porcine)       insulin aspart  0-5 Units Subcutaneous QHS   insulin aspart  0-9 Units Subcutaneous TID WC   insulin aspart  3 Units Subcutaneous TID WC   insulin glargine-yfgn  10 Units Subcutaneous Daily   metoprolol tartrate  12.5 mg Oral BID   midodrine  10 mg Oral Q M,W,F-HD   midodrine  5 mg Per Tube Q8H   multivitamin  1 tablet Per Tube QHS   nutrition supplement (JUVEN)  1 packet Per Tube BID BM   mouth rinse  15 mL Mouth Rinse 4 times per day   QUEtiapine  50 mg Per Tube QHS   QUEtiapine  50 mg Per Tube Q0600    vancomycin 750 mg (08/11/23 1053)   acetaminophen (TYLENOL) oral liquid 160 mg/5 mL, albuterol, clonazePAM, Gerhardt's butt cream, haloperidol lactate, heparin sodium (porcine), oxyCODONE **OR** HYDROmorphone (DILAUDID) injection, mouth rinse

## 2023-08-11 NOTE — Procedures (Signed)
HD Note:  Some information was entered later than the data was gathered due to patient care needs. The stated time with the data is accurate.  Received patient in bed to unit.   Alert and oriented.   Informed consent signed and in chart.   Access used: Left femoral HD catheter Access issues: None  Patient tolerated treatment well. No issues with hypotension.  Midodrine dose ordered for dialysis was held, patient diastolic pressure readings were in the 90s.  Dr. Arlean Hopping notiifed.  TX duration: 3 hours  Alert, without acute distress.  Total UF removed: 2500 ml  Hand-off given to patient's nurse.   Transported back to the room   Shereese Bonnie L. Dareen Piano, RN Kidney Dialysis Unit.

## 2023-08-11 NOTE — Progress Notes (Signed)
Occupational Therapy Treatment Patient Details Name: [name] MRN: [id number] DOB: Jun 18, 1981 Today's Date: 08/11/2023   History of present illness Patient is a 42 y/o male admitted 07/09/23 with confusion found to be in septic shock found to have MRSA in blood cultures, in DKA, TEE positive for vegitation on multiple valves, MRI showing R MCA infarct.  Pt with resp failure on 10/21 resulting in intubation and transfer to [facility]; extubated 10/24.  CRRT started 10/22-stopped 10/30.  PMH positive for ESRD on HD, IDDM, gastroparesis, L AKA, HTN.   OT comments  Focus session on following commands, EOB balance, safety, L sided awareness. Pt needing max multimodal cues to attend to L side. Poor command following and occasionally initiates but needing max multimodal cues to sustain. Poor sitting balance EOB. Facilitating midline with lean onto L and R elbows and return to midline. Pt verbose throughout session repeating self frequently and with frequent use of profanity needing max redirection. Pt likely overstimulated with 4 staff in room during session. Assisted nursing staff with transition to air bed. Will continue to follow.       If plan is discharge home, recommend the following:  Two people to help with walking and/or transfers;Two people to help with bathing/dressing/bathroom;Assistance with cooking/housework;Assistance with feeding;Help with stairs or ramp for entrance;Assist for transportation;Direct supervision/assist for financial management;Direct supervision/assist for medications management   Equipment Recommendations  Other (comment) (defer)    Recommendations for Other Services      Precautions / Restrictions Precautions Precautions: Fall Precaution Comments: LAKA, L hemiparesis, R wrist restraint, flexiseal, coretrak, blood clot LUE Restrictions Weight Bearing Restrictions: No LLE Weight Bearing: Non weight bearing       Mobility Bed Mobility Overal bed mobility: Needs  Assistance Bed Mobility: Supine to Sit, Sit to Supine Rolling: Max assist, +2 for physical assistance, +2 for safety/equipment Sidelying to sit: Total assist, +2 for physical assistance Supine to sit: +2 for physical assistance, Total assist, HOB elevated     General bed mobility comments: Assist for all aspects except to move RLE toward EOB; hand over hand assist to use Rt UE on bed rail.    Transfers                   General transfer comment: lateral scoot toward HOB while EOB with total A +2     Balance Overall balance assessment: Needs assistance Sitting-balance support: Single extremity supported, Bilateral upper extremity supported, Feet supported Sitting balance-[name]: Poor                                     ADL either performed or assessed with clinical judgement   ADL Overall ADL's : Needs assistance/impaired                         Toilet Transfer: +2 for physical assistance;+2 for safety/equipment;Total assistance Toilet Transfer Details (indicate cue type and reason): lateral scoot to HOB 1.5 ft toward R Toileting- Clothing Manipulation and Hygiene: Total assistance;Bed level              Extremity/Trunk Assessment Upper Extremity Assessment Upper Extremity Assessment: Left hand dominant;LUE deficits/detail LUE Deficits / Details: No active movement; unable to detect stimuli LUE Sensation: decreased light touch LUE Coordination: decreased fine motor;decreased gross motor   Lower Extremity Assessment Lower Extremity Assessment: Defer to PT evaluation  Vision       Perception     Praxis      Cognition Arousal: Alert Behavior During Therapy: Flat affect Overall Cognitive Status: Impaired/Different from baseline Area of Impairment: Orientation, Attention, Memory, Following commands, Safety/judgement, Awareness, Problem solving                 Orientation Level: Disoriented to, Time,  Situation Current Attention Level: Focused Memory: Decreased short-term memory Following Commands: Follows one step commands inconsistently, Follows one step commands with increased time Safety/Judgement: Decreased awareness of deficits, Decreased awareness of safety Awareness: Intellectual Problem Solving: Slow processing, Decreased initiation, Difficulty sequencing, Requires verbal cues, Requires tactile cues General Comments: pt loquacious today. perseverating at times on getting to a wheelchair, getting the window open, menu. pt appears overstimluated and attention impacted/challenged by high volume of external stimuli with 4 persons in the room (PT/OT and NT/RN). pt cussing "mother F*&4#er" and threatening therapist intermittently. responds well to calm demeanor but this was limited by amount of external stimulation in room making more difficult to redirect        Exercises Exercises: Other exercises Other Exercises Other Exercises: PROM L UE elbow/wrist/hand, shoulder to 90 Other Exercises: doffed resting hand splint for skin check and pt appears to be tolerating well. Other Exercises: assisted nursing staff with dependent transfer/slide pt to air bed    Shoulder Instructions       General Comments      Pertinent Vitals/ Pain       Pain Assessment Pain Assessment: Faces Faces Pain Scale: Hurts a little bit Pain Location: generalized; pt requesting meds from RN in room Pain Descriptors / Indicators: Discomfort Pain Intervention(s): Limited activity within patient's tolerance, Monitored during session  Home Living                                          Prior Functioning/Environment              Frequency  Min 2X/week        Progress Toward Goals  OT Goals(current goals can now be found in the care plan section)  Progress towards OT goals: Progressing toward goals  Acute Rehab OT Goals Patient Stated Goal: get a wheelchair OT Goal  Formulation: With patient/family Time For Goal Achievement: 08/16/23 Potential to Achieve Goals: Good ADL Goals Pt Will Perform Grooming: with min assist;sitting Pt Will Perform Lower Body Bathing: with mod assist;sit to/from stand Pt Will Perform Lower Body Dressing: sitting/lateral leans;with max assist Pt Will Transfer to Toilet: with max assist;with transfer board;bedside commode Pt Will Perform Toileting - Clothing Manipulation and hygiene: with mod assist;sitting/lateral leans Additional ADL Goal #1: Pt will follow all one step commands with no more than min cues  Plan      Co-evaluation    PT/OT/SLP Co-Evaluation/Treatment: Yes Reason for Co-Treatment: Necessary to address cognition/behavior during functional activity;For patient/therapist safety;To address functional/ADL transfers PT goals addressed during session: Mobility/safety with mobility;Balance OT goals addressed during session: ADL's and self-care;Strengthening/ROM      AM-PAC OT "6 Clicks" Daily Activity     Outcome Measure   Help from another person eating meals?: A Lot Help from another person taking care of personal grooming?: A Lot Help from another person toileting, which includes using toliet, bedpan, or urinal?: Total Help from another person bathing (including washing, rinsing, drying)?: Total Help from another person to put  on and taking off regular upper body clothing?: Total Help from another person to put on and taking off regular lower body clothing?: Total 6 Click Score: 8    End of Session    OT Visit Diagnosis: Other abnormalities of gait and mobility (R26.89);Other symptoms and signs involving the nervous system (R29.898);Hemiplegia and hemiparesis;Muscle weakness (generalized) (M62.81);Other symptoms and signs involving cognitive function Hemiplegia - Right/Left: Left Hemiplegia - caused by: Cerebral infarction   Activity Tolerance Patient tolerated treatment well   Patient Left in  bed;with call bell/phone within reach;with bed alarm set   Nurse Communication Mobility status;Precautions        Time: 8295-6213 OT Time Calculation (min): 38 min  Charges: OT General Charges $OT Visit: 1 Visit OT Treatments $Therapeutic Activity: 8-22 mins  [name], OTD, OTR/L [facility] Acute Rehabilitation Office: [phone]   [name] 08/11/2023, 5:28 PM

## 2023-08-11 NOTE — Progress Notes (Signed)
Physical Therapy Treatment Patient Details Name: [name] MRN: [id number] DOB: 03-Aug-1981 Today's Date: 08/11/2023   History of Present Illness Patient is a 42 y/o male admitted 07/09/23 with confusion found to be in septic shock found to have MRSA in blood cultures, in DKA, TEE positive for vegitation on multiple valves, MRI showing R MCA infarct.  Pt with resp failure on 10/21 resulting in intubation and transfer to [facility]; extubated 10/24.  CRRT started 10/22-stopped 10/30.  PMH positive for ESRD on HD, IDDM, gastroparesis, L AKA, HTN.    PT Comments  Pt continues to require Total assist for bed mobility to roll and complete supine<>sit. Max assist for seated balance at EOB with assit to keep Rt LE on floor for support and to prevent Lt lean. Pt talkative this visit repeated questions/statements from staff and using foul language. Pt required maximum efforts for redirection to attend to tasks and calm behaviors. Pt distracted and possibly overstimulated with 4 providers in room limiting pt's ability to maintain focus. Lateral shifting attempted to facilitate WB in Lt UE with max assit to return to midline. EOS pt returned to supine and PT/OT assist nursing staff to transfer to new pressure relief mattress. Will continue to progress pt as able.   If plan is discharge home, recommend the following: Two people to help with walking and/or transfers;Assist for transportation;Supervision due to cognitive status;Two people to help with bathing/dressing/bathroom;Help with stairs or ramp for entrance   Can travel by private vehicle     No  Equipment Recommendations  Hoyer lift;Wheelchair (measurements PT)    Recommendations for Other Services       Precautions / Restrictions Precautions Precautions: Fall Precaution Comments: LAKA, L hemiparesis, R wrist restraint, flexiseal, coretrak, blood clot LUE Restrictions Weight Bearing Restrictions: No LLE Weight Bearing: Non weight bearing      Mobility  Bed Mobility Overal bed mobility: Needs Assistance Bed Mobility: Supine to Sit, Sit to Supine Rolling: Max assist, +2 for physical assistance, +2 for safety/equipment Sidelying to sit: Total assist, +2 for physical assistance Supine to sit: +2 for physical assistance, Total assist, HOB elevated     General bed mobility comments: Assist for all aspects except to move RLE toward EOB; hand over hand assist to use Rt UE on bed rail.    Transfers                        Ambulation/Gait                   Stairs             Wheelchair Mobility     Tilt Bed    Modified Rankin (Stroke Patients Only)       Balance Overall balance assessment: Needs assistance Sitting-balance support: Single extremity supported, Bilateral upper extremity supported, Feet supported Sitting balance-[name]: Poor                                      Cognition Arousal: Alert Behavior During Therapy: Flat affect Overall Cognitive Status: Impaired/Different from baseline Area of Impairment: Orientation, Attention, Memory, Following commands, Safety/judgement, Awareness, Problem solving                 Orientation Level: Disoriented to, Time, Situation Current Attention Level: Focused Memory: Decreased short-term memory Following Commands: Follows one step commands inconsistently, Follows one step commands  with increased time Safety/Judgement: Decreased awareness of deficits, Decreased awareness of safety Awareness: Intellectual Problem Solving: Slow processing, Decreased initiation, Difficulty sequencing, Requires verbal cues, Requires tactile cues General Comments: pt loquacious today. perseverating at times on getting to a wheelchair, getting the windown open. pt appears overstimluated and attention impacted/challenged by high volume of external stimuli with 4 persons in the room (PT/OT and NT/RN). pt cussing "mother F*&4#er" and  threatening therapist intemittently. responds well to calm demeanor but this was limited by amount of external stimulation in room.        Exercises      General Comments        Pertinent Vitals/Pain Pain Assessment Pain Assessment: Faces Faces Pain Scale: Hurts a little bit Pain Location: generalized; pt requesting meds from RN in room Pain Descriptors / Indicators: Discomfort Pain Intervention(s): Limited activity within patient's tolerance, Monitored during session, Repositioned    Home Living                          Prior Function            PT Goals (current goals can now be found in the care plan section) Acute Rehab PT Goals Patient Stated Goal: none stated PT Goal Formulation: Patient unable to participate in goal setting Time For Goal Achievement: 08/25/23 Potential to Achieve Goals: Fair Progress towards PT goals: Progressing toward goals    Frequency    Min 1X/week      PT Plan      Co-evaluation PT/OT/SLP Co-Evaluation/Treatment: Yes Reason for Co-Treatment: Necessary to address cognition/behavior during functional activity;For patient/therapist safety;To address functional/ADL transfers PT goals addressed during session: Mobility/safety with mobility;Balance OT goals addressed during session: ADL's and self-care;Strengthening/ROM      AM-PAC PT "6 Clicks" Mobility   Outcome Measure  Help needed turning from your back to your side while in a flat bed without using bedrails?: Total Help needed moving from lying on your back to sitting on the side of a flat bed without using bedrails?: Total Help needed moving to and from a bed to a chair (including a wheelchair)?: Total Help needed standing up from a chair using your arms (e.g., wheelchair or bedside chair)?: Total Help needed to walk in hospital room?: Total Help needed climbing 3-5 steps with a railing? : Total 6 Click Score: 6    End of Session   Activity Tolerance: Patient  tolerated treatment well Patient left: in bed;with call bell/phone within reach;with nursing/sitter in room Nurse Communication: Mobility status PT Visit Diagnosis: Other abnormalities of gait and mobility (R26.89);Other symptoms and signs involving the nervous system (R29.898);Hemiplegia and hemiparesis Hemiplegia - Right/Left: Left Hemiplegia - dominant/non-dominant: Non-dominant Hemiplegia - caused by: Cerebral infarction     Time: 9604-5409 PT Time Calculation (min) (ACUTE ONLY): 37 min  Charges:    $Therapeutic Activity: 8-22 mins PT General Charges $$ ACUTE PT VISIT: 1 Visit                     Wynn Maudlin, DPT Acute Rehabilitation Services Office [phone]  08/11/23 4:46 PM

## 2023-08-11 NOTE — Progress Notes (Signed)
PROGRESS NOTE    [name]  QMV:[id number] DOB: July 06, 1981 DOA: 07/09/2023 PCP: [name], NP  Chief Complaint  Patient presents with   Altered Mental Status    Hospital Course:  42 yo M w/ pertinent PMH ESRD on HD MWF, IDDM, gastroparesis, left AKA, HTN presents to [facility] on 10/11 w/ AMS. Patient recently admitted to [facility] on 7/12 sepsis from right diabetic foot ulcer. Cultures w/ MRSA. Patient had dialysis catheter removed and replaced. ID treated w/ vanc. Patient refused echo and left on 7/17 against medical advice.  On 10/11 patient was found by family member on ground altered. EMS arrived and dialysis catheter partially removed. Patient more alert for them. Has been complaining of diarrhea x1 week per family. Unsure if patient went to dialysis today. Transferred to [facility] ED. On arrival bp stable 107/70 and afebrile. Patient remains confused and occasionally with agitation. Given dilaudid. CBG 497. Beta H >8. VBG ph 7.06, 16, 54, 4.7. Given iv fluids and started on insulin drip for DKA. CT head negative for acute abnormality. Cxr no significant findings. CT abd/pelvis no acute findings; possible steatosis vs. Liver mass 7 x 4 cm recommending f/u w/ MRI. WBC 20.7 and LA 2.4. Sepsis protocol initiated. Cultures obtained and started on vanc/zosyn. Given ams, dka, and soft bp pccm consulted for icu admission.   10/21 pccm called overnight to assess for worsening hypoxia and AMS after vomiting. On NRB w/ sats 70-80s. Hypoglycemic given dextrose which improved his mental status some. Transferred to 2H icu for intubation.    10/12: altered mental status, DKA 10/12: blood cultures positive for MRSA 10/13 TTE LVEF 25-30% w/ severely depressed LVF, global hypokinesis, gd I diastolic dysfxn, mod RV fxn reduction. Possible filamentous structure on MV 10/16 TEE LVEF 30-35%RV fxn nml. Large mass (2.00 cm x 0.88 cm ) on the posterior mitral leaflet which appeared to be extending in the mitral  annulus. There are two mobile masses visible on the anterior leaflet of the tricupid valve which also appears to have a perforation. Another mobile  mass ( 0.68 cm x 0.67 cm) on the posterior tricupid leaflet. Moderate Tricuspid regurgitation which appears to  be through the leaflet perforation  10/16 seen by Cardiothoracic surg. Felt poor candidate. Recommended 6 weeks abx and recommended repeat TEE 1 week 10/17 noting left arm weakness. Not clear when started.  MRI showed Right MCA infarct w/ acute infarcts in left occipital and bilateral cerebellum felt c/w septic emboli. Neuro consulted.  10/18 tunneled left femoral vasc cath placed after catheter holiday  10/20 palliative care consulted later that day worsening hypoxia. Sats 70s. After vomiting. More lethargic  10/21: resp failure requiring intubation shortly after midnight  10/22- TEE 10/22 with EF 35%, LV global hypokinesis. Very large vegetation lateral scallop of posterior mitral leaflet 3.9cm length 1 cm width, highly mobile, prolapsing into LV. No large vegetation seen on TV.  Later that afternoon and abrupt hemodynamic changes requiring escalation of vasopressor support.  Felt given the size of his vegetation and increased bulk of the vegetation infections unlikely to clear.  He was felt to be decompensation may have due to of breath leaflet perforation and acute mitral valve regurgb 10/23: per nursing less responsive than yesterday, increased ETT secretions. Clotting off CRRT. Ct head showing expected evolution of the right MCA stroke.  Cardiothoracic surgery re-consulted for worsening MVE. Felt too high risk here. Recommended referral to tertiary care  10/24 agitated when sedation decreased, is purposeful but does  not follow commands. Tolerating UF gaols of -150. Still on NE infusion but extubated later that afternoon after passing SBT 10/25 failed swallow eval still on just 3 lpm O2. Encephalopathic. Added seroquel in effort to get him off  the precedex. Cardiology told no beds at [facility]. Not clear if accepted or not. Worsening pressor requirements. Got blood for hgb 6. Cefepime resumed for clinical decline and leukocytosis  10/26 Hemoglobin again dropped to 6.3 overnight with increased pressor requirement yesterday afternoon now on vaso and levo. 10/27 Hypoglycemic episode overnight, mental status improved a little over weekend  10/28 Getting another unit of blood more awake.  Oriented x 2.  Still on Precedex.  Having liquid stools. 10/30 Off Precedex, low-dose levo.  Plan to transition to IHD 10/29 Cefepime for aspiration course completed.  10/31 Seen by overnight team for hypoxia and somnolence ABG with mild hypercapnia. Again declined by [facility] because they were at capacity. More awake. Doing better w/ Bed side swallow but still likely aspirating. CRRT stopped.  11/1 Adjustments made for better glycemic control.  Much more awake but a little confused 11/4 Remains on low dose levo 11/5; weaned off pressors and transferred to progressive care unit Patient was extubated, transferred to progressive care unit under [facility] service on 08/04/2023   11/7-called [facility], at capacity.  Declined transfer 11/12-femoral line removed; palliative care consulted for goals of care 11/12-called [facility] for transfer, declined due to capacity  Subjective: Pt is initially calm on evaluation today and endorses abdominal pain. He then becomes suddenly verbally aggressive but is redirectable. RN reports pt has been experiencing bouts of verbal aggression today since return from HD. RN also reports ongoing diarrhea    Objective: Vitals:   08/11/23 1200 08/11/23 1206 08/11/23 1221 08/11/23 1303  BP: 138/61 138/78 137/87   Pulse: (!) 122 (!) 122 (!) 124   Resp: 11 12 16    Temp:    99.5 F (37.5 C)  TempSrc:      SpO2: 100% 100% 100%   Weight:    77.5 kg  Height:        Intake/Output Summary (Last 24 hours) at 08/11/2023 1623 Last data filed at  08/11/2023 1221 Gross per 24 hour  Intake 245 ml  Output 2535 ml  Net -2290 ml   Filed Weights   08/11/23 0400 08/11/23 0853 08/11/23 1303  Weight: 81.1 kg 80 kg 77.5 kg    Examination:  General exam: Appears calm and comfortable  Respiratory system: Clear to auscultation. Respiratory effort normal. Cardiovascular system: S1 & S2 heard, RRR.  Gastrointestinal system: Abdomen is nondistended, soft. TTP in epigastrium. No organomegaly or masses felt.  Central nervous system: Alert and intermittently aggressive Extremities: s/p L AKA healed appropriately Skin: tattoos Psychiatry: calm with intermittent verbal aggression.  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)   Hemiparesis affecting left side as late effect of stroke ([name])   Dysphagia due to recent stroke   Physical deconditioning   Cavitary pneumonia   Subclavian vein occlusion, bilateral ([name])   Acute blood loss anemia   Fatty liver   Liver mass   Insulin dependent type 1 diabetes mellitus ([name])   Diabetic gastroparesis ([name])   Septic shock ([name])   Anemia of chronic disease   Iron deficiency anemia, unspecified   Secondary hyperparathyroidism of renal  origin ([name])   Thrombocytopenia ([name])   Acute metabolic encephalopathy   MRSA bacteremia   Right elbow pain   ESRD on hemodialysis ([name])   Acute systolic CHF (congestive heart failure) ([name])   DCM (dilated cardiomyopathy) ([name])   Coronary artery calcification seen on CAT scan   Infective endocarditis of tricuspid valve   Protein-calorie malnutrition, severe   Hx of AKA (above knee amputation), left ([name])   Cerebrovascular accident (CVA) due to embolism of cerebral artery ([name])    Evolving cavitary PNA and mucous plugging Suspect cavitation is embolic from tricuspid valve  endocarditis. - Supplemental O2 support for O2 sat > 90%, on RA at present - Pulmonary hygiene (IS/flutter, mobilization as able) - Vancomycin per ID recs - Obtain CT 4-6 weeks   Negative HIV status -Patient spit in the eye of one of the nurses last week, health at work started her on antiretroviral therapy for HIV -Surprisingly all the labs in the epic are negative for HIV -HIV antibody from July 2024 was negative -HIV RNA was ordered which was negative; no clear evidence that patient has HIV   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  -shock resolved - Off of vasopressors, consider droxidopa if requiring resumption -Continue midodrine with dialysis only.  Blood pressure elevated today. 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 - Low dose metoprolol held resumed 11/13 - Definitive management is valve repair/replacement - Cardiology/TCTS consulted, recommend transfer to tertiary care center due to high-risk patient/procedure - [facility] has denied transfer x 2 (?capacity), [facility] denied for multiple prohibitive surgical risks -[facility] for transfer on 08/05/2023, declined at this time due to capacity. -Again called [facility] on 08/10/2023; declined due to capacity   Ongoing sinus tachycardia Heart rate remains 130s to 140s. - Cardiac monitoring - Optimize electrolytes for K > 4, Mg > 2 Discontinue midodrine today.  Initiate metoprolol Potentially 2/2 to DVT   LUE DVT Dopplers 11/4 positive for L IJ, L [place], L axillary veins; septic versus VTE. - Heparin gtt started for [facility] --Now to apixaban    Acute metabolic encephalopathy w/ agitated delirium  -Resolved, back to baseline Acute Right MCA  stroke/septic infarct w/ left sided hemiparesis   MRA Brain 10/17 ?R M2 occlusion, found to have R MCA territory infarct, favored to be septic emboli. - Delirium precautions - Klonopin PRN - Continue Seroquel - PT/OT/SLP as able to participate in care   End-stage renal disease on hemodialysis (MWF at baseline) Has occluded bilateral [place] veins  Mild hyperkalemia off CRRT - Nephrology following, appreciate assistance - Successfully transitioned to [facility], last HD 11/13 SP line holiday w/ old TDC removed 10/16 and new TDC placed 10/18 by IR.  - Trend BMP - Replete electrolytes as indicated - Monitor I&Os - Avoid nephrotoxic agents as able - Ensure adequate renal perfusion   Dysphagia - TF via Cortrak -Speech therapy working with pt -Started on dysphagia 1 diet with nectar thick liquid -Will see how he does with diet over next few days -Might need long-term PEG tube placement   T2DM w/ hyperglycemia and hypoglycemia, brittle Cont SSI, titrate as tolerated  -Restart tube feeding at 30 cc/h -Continue e Semglee to 10 units subcu twice daily -Continue CBG to 4 hours    Anemia  and thrombocytopenia critical illness S/p 5 units total PRBC. -Hemoglobin is currently stable, cont to follow   Incidental finding of Liver steatosis vs. liver mass CT A/P showing 7 x 4 cm density in anterior liver. - Outpatient surveillance/GI follow up   Hyponatremia On HD, expect further resolution  Goals of care -Overall poor prognosis due to multiple comorbid conditions with ongoing antibiotics for mitral valve endocarditis -Poor surgical candidate as per [facility] surgery at [facility] -Declined transfer by [facility] - Declined at [facility] for transfer, they have been at capacity for past 2 weeks -Will reconsult palliative care for clarification of goals of care -Otherwise patient can go to LTAC     DVT prophylaxis: Eliquis Code Status: Full Family Communication: none present at bedside  today Disposition:   Status is: Inpatient. Discuss with case management, may be able to transition to LTAC now. Pending further palliative care discussions   Consultants:  Palliative care Nephrology ID   Antimicrobials:  Anti-infectives (From admission, onward)    Start     Dose/Rate Route Frequency Ordered Stop   08/02/23 1200  vancomycin (VANCOREADY) IVPB 750 mg/150 mL        750 mg 150 mL/hr over 60 Minutes Intravenous Every M-W-F (Hemodialysis) 07/31/23 1404     07/30/23 1800  vancomycin (VANCOREADY) IVPB 750 mg/150 mL        750 mg 150 mL/hr over 60 Minutes Intravenous Every M-W-F (Hemodialysis) 07/30/23 1358 07/30/23 1923   07/29/23 0812  vancomycin variable dose per unstable renal function (pharmacist dosing)  Status:  Discontinued         Does not apply See admin instructions 07/29/23 0813 07/31/23 1404   07/28/23 1000  vancomycin (VANCOREADY) IVPB 750 mg/150 mL  Status:  Discontinued        750 mg 150 mL/hr over 60 Minutes Intravenous Every 24 hours 07/27/23 1539 07/29/23 0813   07/27/23 1630  vancomycin (VANCOREADY) IVPB 500 mg/100 mL        500 mg 100 mL/hr over 60 Minutes Intravenous  Once 07/27/23 1539 07/27/23 1759   07/23/23 1945  ceFEPIme (MAXIPIME) 2 g in sodium chloride 0.9 % 100 mL IVPB  Status:  Discontinued        2 g 200 mL/hr over 30 Minutes Intravenous Every 12 hours 07/23/23 1856 07/27/23 1053   07/21/23 1200  vancomycin (VANCOREADY) IVPB 750 mg/150 mL  Status:  Discontinued        750 mg 150 mL/hr over 60 Minutes Intravenous Every M-W-F (Hemodialysis) 07/19/23 1106 07/20/23 0842   07/21/23 0230  vancomycin (VANCOREADY) IVPB 750 mg/150 mL  Status:  Discontinued        750 mg 150 mL/hr over 60 Minutes Intravenous Every 24 hours 07/20/23 1451 07/27/23 1314   07/20/23 2200  ceFEPIme (MAXIPIME) 2 g in sodium chloride 0.9 % 100 mL IVPB  Status:  Discontinued        2 g 200 mL/hr over 30 Minutes Intravenous Every 12 hours 07/20/23 1451 07/21/23 1108    07/19/23 2200  ceFEPIme (MAXIPIME) 1 g in sodium chloride 0.9 % 100 mL IVPB  Status:  Discontinued        1 g 200 mL/hr over 30 Minutes Intravenous Every 24 hours 07/19/23 1409 07/20/23 1451   07/19/23 2000  ceFEPIme (MAXIPIME) 2 g in sodium chloride 0.9 % 100 mL IVPB  Status:  Discontinued        2 g 200 mL/hr over 30 Minutes Intravenous Every M-W-F 07/19/23 0110  07/19/23 1409   07/19/23 0200  ceFEPIme (MAXIPIME) 2 g in sodium chloride 0.9 % 100 mL IVPB        2 g 200 mL/hr over 30 Minutes Intravenous  Once 07/19/23 0110 07/19/23 0221   07/18/23 1015  vancomycin (VANCOREADY) IVPB 750 mg/150 mL        750 mg 150 mL/hr over 60 Minutes Intravenous  Once 07/18/23 0929 07/19/23 1137   07/16/23 0000  ceFAZolin (ANCEF) IVPB 2g/100 mL premix        2 g 200 mL/hr over 30 Minutes Intravenous To Radiology 07/15/23 1408 07/16/23 1250   07/14/23 1200  vancomycin (VANCOREADY) IVPB 750 mg/150 mL  Status:  Discontinued        750 mg 150 mL/hr over 60 Minutes Intravenous Every M-W-F (Hemodialysis) 07/12/23 1203 07/19/23 1106   07/11/23 2330  vancomycin (VANCOREADY) IVPB 750 mg/150 mL        750 mg 150 mL/hr over 60 Minutes Intravenous  Once 07/11/23 2030 07/12/23 0110   07/11/23 1800  vancomycin (VANCOREADY) IVPB 750 mg/150 mL  Status:  Discontinued        750 mg 150 mL/hr over 60 Minutes Intravenous  Once 07/11/23 0856 07/11/23 1810   07/10/23 2318  vancomycin variable dose per unstable renal function (pharmacist dosing)  Status:  Discontinued         Does not apply See admin instructions 07/10/23 2319 07/12/23 1203   07/10/23 1429  vancomycin (VANCOREADY) IVPB 750 mg/150 mL  Status:  Discontinued        750 mg 150 mL/hr over 60 Minutes Intravenous Every Dialysis 07/10/23 1429 07/11/23 2030   07/10/23 1230  fidaxomicin (DIFICID) tablet 200 mg  Status:  Discontinued        200 mg Oral 2 times daily 07/10/23 1139 07/10/23 1207   07/10/23 1000  piperacillin-tazobactam (ZOSYN) IVPB 2.25 g  Status:   Discontinued        2.25 g 100 mL/hr over 30 Minutes Intravenous Every 8 hours 07/10/23 0908 07/10/23 1350   07/09/23 2330  vancomycin (VANCOREADY) IVPB 1750 mg/350 mL        1,750 mg 175 mL/hr over 120 Minutes Intravenous  Once 07/09/23 2329 07/10/23 0734   07/09/23 2330  piperacillin-tazobactam (ZOSYN) IVPB 3.375 g        3.375 g 100 mL/hr over 30 Minutes Intravenous  Once 07/09/23 2329 07/10/23 0240       Data Reviewed: I have personally reviewed following labs and imaging studies  CBC: Recent Labs  Lab 08/06/23 0740 08/07/23 0540 08/08/23 0430 08/09/23 0651 08/11/23 0906  WBC 16.0* 13.4* 22.3* 12.6* 12.2*  NEUTROABS  --   --   --   --  9.7*  HGB 8.0* 7.9* 7.8* 7.1* 7.5*  HCT 26.3* 25.7* 26.3* 23.1* 24.6*  MCV 96.3 97.3 97.4 95.9 96.1  PLT 355 302 271 265 294    Basic Metabolic Panel: Recent Labs  Lab 08/05/23 0508 08/06/23 0614 08/07/23 0540 08/08/23 0430 08/09/23 0651 08/09/23 1130 08/10/23 0500 08/11/23 0223 08/11/23 0906  NA 130*  --   --   --   --  133*  --   --  128*  K 3.6  --   --   --   --  4.4  --   --  4.2  CL 90*  --   --   --   --  98  --   --  90*  CO2 25  --   --   --   --  22  --   --  27  GLUCOSE 234*  --   --   --   --  209*  --   --  191*  BUN 45*  --   --   --   --  86*  --   --  65*  CREATININE 3.71*  --   --   --   --  6.46*  --   --  6.04*  CALCIUM 8.7*  --   --   --   --  9.0  --   --  9.4  MG 2.1   < > 2.4 2.8* 2.9*  --  2.6* 3.0*  --   PHOS 4.2  --   --   --   --  4.7*  --   --   --    < > = values in this interval not displayed.    GFR: Estimated Creatinine Clearance: 14.7 mL/min (A) (by C-G formula based on SCr of 6.04 mg/dL (H)).  Liver Function Tests: Recent Labs  Lab 08/09/23 1130 08/11/23 0906  AST  --  21  ALT  --  14  ALKPHOS  --  173*  BILITOT  --  0.7  PROT  --  6.8  ALBUMIN 2.0* 2.0*    CBG: Recent Labs  Lab 08/10/23 2100 08/10/23 2133 08/11/23 0300 08/11/23 0621 08/11/23 1502  GLUCAP 62* 94 93  169* 262*     No results found for this or any previous visit (from the past 240 hour(s)).       Radiology Studies: No results found.      Scheduled Meds:  apixaban  5 mg Per Tube BID   calcitRIOL  1.5 mcg Per Tube Q M,W,F   Chlorhexidine Gluconate Cloth  6 each Topical Q0600   [START ON 08/13/2023] darbepoetin (ARANESP) injection - DIALYSIS  100 mcg Subcutaneous Q Fri-1800   feeding supplement (NEPRO CARB STEADY)  1,000 mL Per Tube Q24H   feeding supplement (NEPRO CARB STEADY)  237 mL Oral TID WC   feeding supplement (PROSource TF20)  60 mL Per Tube Daily   fiber supplement (BANATROL TF)  60 mL Per Tube BID   gabapentin  300 mg Per Tube QHS   heparin sodium (porcine)       insulin aspart  0-5 Units Subcutaneous QHS   insulin aspart  0-9 Units Subcutaneous TID WC   insulin aspart  3 Units Subcutaneous TID WC   insulin glargine-yfgn  10 Units Subcutaneous Daily   metoprolol tartrate  12.5 mg Per Tube BID   [START ON 08/13/2023] midodrine  10 mg Per Tube Q M,W,F-HD   midodrine  2.5 mg Per Tube Q8H   multivitamin  1 tablet Per Tube QHS   nutrition supplement (JUVEN)  1 packet Per Tube BID BM   mouth rinse  15 mL Mouth Rinse 4 times per day   QUEtiapine  50 mg Per Tube QHS   QUEtiapine  50 mg Per Tube Q0600   Continuous Infusions:  vancomycin 750 mg (08/11/23 1053)     LOS: 32 days    Time spent:     Debarah Crape, DO [facility]   To contact the attending provider between 7A-7P or the covering provider during after hours 7P-7A, please log into the web site [email or web address] and access using universal [place] password for that web site. If you do not have the password, please call the hospital operator.  08/11/2023, 4:23 PM

## 2023-08-12 DIAGNOSIS — Z789 Other specified health status: Secondary | ICD-10-CM

## 2023-08-12 DIAGNOSIS — Z515 Encounter for palliative care: Secondary | ICD-10-CM | POA: Diagnosis not present

## 2023-08-12 DIAGNOSIS — R4182 Altered mental status, unspecified: Secondary | ICD-10-CM | POA: Diagnosis not present

## 2023-08-12 DIAGNOSIS — J168 Pneumonia due to other specified infectious organisms: Secondary | ICD-10-CM

## 2023-08-12 DIAGNOSIS — E1010 Type 1 diabetes mellitus with ketoacidosis without coma: Secondary | ICD-10-CM | POA: Diagnosis not present

## 2023-08-12 DIAGNOSIS — R638 Other symptoms and signs concerning food and fluid intake: Secondary | ICD-10-CM

## 2023-08-12 DIAGNOSIS — Z711 Person with feared health complaint in whom no diagnosis is made: Secondary | ICD-10-CM

## 2023-08-12 DIAGNOSIS — I059 Rheumatic mitral valve disease, unspecified: Secondary | ICD-10-CM | POA: Diagnosis not present

## 2023-08-12 LAB — GLUCOSE, CAPILLARY
Glucose-Capillary: 103 mg/dL — ABNORMAL HIGH (ref 70–99)
Glucose-Capillary: 122 mg/dL — ABNORMAL HIGH (ref 70–99)
Glucose-Capillary: 220 mg/dL — ABNORMAL HIGH (ref 70–99)
Glucose-Capillary: 237 mg/dL — ABNORMAL HIGH (ref 70–99)
Glucose-Capillary: 335 mg/dL — ABNORMAL HIGH (ref 70–99)

## 2023-08-12 LAB — MAGNESIUM: Magnesium: 2.8 mg/dL — ABNORMAL HIGH (ref 1.7–2.4)

## 2023-08-12 MED ORDER — CLONAZEPAM 0.5 MG PO TABS: 0.5000 mg | ORAL_TABLET | Freq: Two times a day (BID) | ORAL | Status: DC | PRN

## 2023-08-12 MED ORDER — INSULIN GLARGINE-YFGN 100 UNIT/ML ~~LOC~~ SOLN
13.0000 [IU] | Freq: Every day | SUBCUTANEOUS | Status: DC
  Administered 2023-08-12: 13 [IU] via SUBCUTANEOUS
  Filled 2023-08-12: qty 0.13

## 2023-08-12 MED ORDER — INSULIN GLARGINE-YFGN 100 UNIT/ML ~~LOC~~ SOLN
15.0000 [IU] | Freq: Every day | SUBCUTANEOUS | Status: DC
  Administered 2023-08-13 – 2023-08-17 (×5): 15 [IU] via SUBCUTANEOUS
  Filled 2023-08-12 (×6): qty 0.15

## 2023-08-12 MED ORDER — LOPERAMIDE HCL 1 MG/7.5ML PO SUSP: 2.0000 mg | ORAL | Status: DC | PRN

## 2023-08-12 MED ORDER — CARMEX CLASSIC LIP BALM EX OINT
TOPICAL_OINTMENT | CUTANEOUS | Status: DC | PRN
  Filled 2023-08-12: qty 10

## 2023-08-12 MED ORDER — CHLORHEXIDINE GLUCONATE CLOTH 2 % EX PADS: 6.0000 | MEDICATED_PAD | Freq: Every day | CUTANEOUS | Status: DC

## 2023-08-12 NOTE — Progress Notes (Signed)
Daily Progress Note   Patient Name: [name]       Date: 08/12/2023 DOB: [birth year]  Age: 42 y.o. MRN#: [id number] Attending Physician: Debarah Crape, DO Primary Care Physician: [name], NP Admit Date: 07/09/2023  Reason for Consultation/Follow-up: Establishing goals of care  Subjective: I have reviewed medical records including EPIC notes, MAR, and labs. Received report from primary RN - no acute concerns. RN reports patient's oral intake has been poor: did not eat breakfast this morning - will try to encourage intake again at lunch. Per RN, patient remains confused and easily agitated.   Went to visit patient at bedside - no family/visitors present. NT at bedside providing care. Patient was lying in bed awake, alert, confused, agitated not wanting to answer questions. Coretrak in place. No respiratory distress, increased work of breathing, or secretions noted.   Called patient's mother/[name] - emotional support provided. Therapeutic listening provided as she reflects on patient's hospital course. Provided detailed medical updates per her request: reviewed ID, cardiology, SLP recommendations and evaluations per her request.   Discussed concern over patient's poor oral intake - she agrees this is a concern. She states that patient has "always been a picky eater." We discussed patient's poor intake either secondary to frustration/refusal of intake vs mechanical issue. Updated that RN would again attempt to encourage intake at lunch and pending outcomes, medical team may recommend additional testing to evaluate swallowing. She questions if diet can be advanced for more food options - reviewed SLP recommendations and current diet is considered the safest (lowers risk for aspiration,  airway compromise, and infection) - she expressed understanding.  I attempted to elicit values and goals of care important to the patient. The difference between aggressive medical intervention and comfort care was considered in light of the patient's goals of care. [name] states, "why are we talking about hospice." She is clear for continued full code/full scope care.  We discussed continuing aggressive interventions, which may include pursing PEG tube for long term artificial feeding if needed. She would be open to this if medically necessary.   Discussed discharge options - she remains clear in goals for [facility]. 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  [name] requests to speak with attending today - Dr. Rennis Chris notified.  All questions and concerns addressed. Encouraged to call with questions and/or concerns. PMT number provided.    Length of Stay: 33  Current Medications: Scheduled Meds:   apixaban  5 mg Per Tube BID   calcitRIOL  1.5 mcg Per Tube Q M,W,F   Chlorhexidine Gluconate Cloth  6 each Topical Q0600   [START ON 08/13/2023] darbepoetin (ARANESP) injection - DIALYSIS  100 mcg Subcutaneous Q Fri-1800   feeding supplement (NEPRO CARB STEADY)  1,000 mL Per Tube Q24H   feeding supplement (NEPRO CARB STEADY)  237 mL Oral TID WC   feeding supplement (PROSource TF20)  60 mL Per Tube Daily   fiber supplement (BANATROL TF)  60 mL Per Tube BID   gabapentin  300 mg Per Tube QHS   insulin aspart  0-5 Units Subcutaneous QHS   insulin aspart  0-9 Units Subcutaneous TID WC   insulin aspart  3 Units Subcutaneous TID WC   insulin glargine-yfgn  13 Units Subcutaneous Daily   metoprolol tartrate  12.5 mg Per Tube BID   [START ON 08/13/2023] midodrine  10 mg Per Tube Q M,W,F-HD   multivitamin  1 tablet Per Tube  QHS   nutrition supplement (JUVEN)  1 packet Per Tube BID BM   mouth rinse  15 mL Mouth Rinse 4 times per day   QUEtiapine  50 mg Per Tube QHS   QUEtiapine  50 mg Per Tube Q0600    Continuous Infusions:  vancomycin 750 mg (08/11/23 1053)    PRN Meds: acetaminophen (TYLENOL) oral liquid 160 mg/5 mL, albuterol, clonazePAM, Gerhardt's butt cream, haloperidol lactate, oxyCODONE **OR** HYDROmorphone (DILAUDID) injection, loperamide, mouth rinse  Physical Exam Vitals and nursing note reviewed.  Constitutional:      General: He is not in acute distress. Pulmonary:     Effort: No respiratory distress.  Skin:    General: Skin is warm and dry.  Neurological:     Mental Status: He is alert. He is disoriented.     Motor: Weakness present.  Psychiatric:        Behavior: Behavior is agitated.        Cognition and Memory: Cognition is impaired. Memory is impaired.        Judgment: Judgment is impulsive.             Vital Signs: BP 105/70 (BP Location: Right Arm)   Pulse (!) 110   Temp 98.6 F (37 C) (Oral)   Resp 17   Ht 5\' 7"  (1.702 m)   Wt 75.8 kg   SpO2 96%   BMI 26.16 kg/m  SpO2: SpO2: 96 % O2 Device: O2 Device: Room Air O2 Flow Rate: O2 Flow Rate (L/min): 4 L/min  Intake/output summary:  Intake/Output Summary (Last 24 hours) at 08/12/2023 1217 Last data filed at 08/12/2023 7564 Gross per 24 hour  Intake 175 ml  Output 2500 ml  Net -2325 ml   LBM: Last BM Date : 08/12/23 Baseline Weight: Weight: 77.6 kg Most recent weight: Weight: 75.8 kg       Palliative Assessment/Data: PPS 30% with tube feeds      Patient Active Problem List   Diagnosis Date Noted   Cerebrovascular accident (CVA) due to embolism of cerebral artery ([name]) 08/02/2023   Endocarditis of tricuspid valve 07/27/2023   Cavitary pneumonia 07/27/2023   Perforation of leaflet of mitral valve 07/27/2023   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)  07/27/2023   Hemiparesis  affecting left side as late effect of stroke ([name]) 07/27/2023   Dysphagia due to recent stroke 07/27/2023   Cardiogenic shock ([name]) 07/27/2023   Acute blood loss anemia 07/27/2023   Hx of AKA (above knee amputation), left ([name]) 07/27/2023   Subclavian vein occlusion, bilateral ([name]) 07/27/2023   Fatty liver 07/27/2023   Liver mass 07/27/2023   Physical deconditioning 07/27/2023   Protein-calorie malnutrition, severe 07/22/2023   Infective endocarditis of tricuspid valve 07/15/2023   Acute systolic CHF (congestive heart failure) ([name]) 07/14/2023   DCM (dilated cardiomyopathy) ([name]) 07/14/2023   Coronary artery calcification seen on CAT scan 07/14/2023   Endocarditis of mitral valve 07/13/2023   ESRD on hemodialysis ([name]) 07/13/2023   MRSA bacteremia 07/12/2023   Right elbow pain 07/12/2023   Acute metabolic encephalopathy 07/10/2023   Diabetic foot infection ([name]) 04/09/2023   Elevated LFTs 04/09/2023   Thrombocytopenia ([name]) 04/09/2023   Cellulitis of right leg 05/19/2021   Pressure injury of skin 10/15/2020   Adjustment disorder, unspecified 09/21/2020   Malnutrition of moderate degree 09/13/2020   Charcot's joint of foot, left    Pneumonia due to COVID-19 virus 02/14/2020   Allergy, unspecified, initial encounter 07/17/2019   Diarrhea 04/24/2019   Encounter for orthopedic aftercare following surgical amputation    DM (diabetes mellitus), secondary, uncontrolled, with neurologic complications    Headache 09/23/2018   Anemia of chronic disease 08/18/2018   Unspecified protein-calorie malnutrition ([name]) 02/14/2018   Insulin dependent type 1 diabetes mellitus ([name]) 10/25/2017   Diabetic gastroparesis ([name]) 10/25/2017   Septic shock ([name]) 10/25/2017   Iron deficiency anemia, unspecified 10/28/2016   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   Secondary hyperparathyroidism of renal origin ([name]) 10/28/2016   HTN (hypertension) 08/09/2014   ESRD on dialysis [facility])  03/19/2014    Palliative Care Assessment & Plan   Patient Profile: 42 y.o. male  with past medical history of ESRD on dialysis, insulin-dependent DM, anemia, left AKA, gastroparesis, and hypertension.  He presented to the ED on 07/09/2023 with altered mental status. On presentation, his dialysis catheter was found to be partially removed.  He was also found to be in DKA.  He is admitted with septic shock due to MRSA bacteremia and infective endocarditis. Hospitalization is complicated by acute ischemic stroke.   Assessment: Principal Problem:   Endocarditis of mitral valve Active Problems:   Insulin dependent type 1 diabetes mellitus ([name])   Diabetic gastroparesis ([name])   Septic shock ([name])   Anemia of chronic disease   Iron deficiency anemia, unspecified   Secondary hyperparathyroidism of renal origin ([name])   Thrombocytopenia ([name])   Acute metabolic encephalopathy   MRSA bacteremia   Right elbow pain   ESRD on hemodialysis ([name])   Acute systolic CHF (congestive heart failure) ([name])   DCM (dilated cardiomyopathy) ([name])   Coronary artery calcification seen on CAT scan   Infective endocarditis of tricuspid valve   Protein-calorie malnutrition, severe   Cavitary pneumonia   Perforation of leaflet of mitral valve   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 If there are any  imminent needs please call the service directly  Goals of Care and Additional Recommendations: Limitations on Scope of Treatment: Full Scope Treatment  Code Status:    Code Status Orders  (From admission, onward)           Start     Ordered   07/10/23 0130  Full code  Continuous       Question:  By:  Answer:  Consent: discussion documented in EHR   07/10/23 0131           Code Status History     Date Active Date Inactive Code Status Order ID Comments User Context   07/10/2023 0119 07/10/2023 0131 Full Code [id number]  Patrici Ranks, MD ED   04/09/2023 2237 04/15/2023 0003 Full Code [id number]  Briscoe Deutscher, MD ED   08/09/2021 1156 08/12/2021 0242 Full Code [id number]  Dellia Cloud, MD ED   05/19/2021 2107 05/23/2021 2342 Full Code [id number]  Hillary Bow, DO ED   10/14/2020 1357 10/22/2020 0740 Full Code [id number]  Emeline General, MD Inpatient   09/11/2020 0424 10/12/2020 0833 Full Code [id number]  Chotiner, Claudean Severance, MD ED   07/22/2020 0535 07/22/2020 2355 Full Code [id number]  Delano Metz, MD Inpatient   07/22/2020 0525 07/22/2020 0535 Full Code [id number]  Charlotte Sanes, MD ED   05/31/2020 0030 05/31/2020 0621 Full Code [id number]  Swayze, Ava, DO ED   02/14/2020 1159 02/18/2020 1935 Full Code [id number]  Erick Blinks, MD ED   01/31/2020 0317 02/01/2020 1645 Full Code [id number]  Eduard Clos, MD ED   04/24/2019 1358 04/27/2019 2047 Full Code [id number]  Lorenso Courier, MD ED   10/26/2018 1950 11/03/2018 1944 Full Code [id number]  Haydee Monica, MD ED   10/20/2018 1730 10/25/2018 2038 Full Code [id number]  Elease Etienne, MD Inpatient   08/17/2018 2332 08/25/2018 1645 Full Code [id number]  John Giovanni, MD Inpatient   02/01/2018 1446 02/06/2018 0032 Full Code [id number]  Gwynn Burly, DO ED   10/25/2017 2225 11/03/2017 2322 Full Code [id number]  Clydie Braun, MD ED   11/16/2016 1338 11/16/2016 1649 Full Code [id number]  Sherren Kerns, MD Inpatient   05/27/2015 0707  05/29/2015 1809 Full Code [id number]  Arnaldo Natal Inpatient       Prognosis:  Unable to determine  Discharge Planning: [facility]  Care plan was discussed with primary RN, patient's mother, Dr. Rennis Chris, [facility]  Thank you for allowing the Palliative Medicine Team to assist in the care of this patient.      Haskel Khan, NP  Please contact Palliative Medicine Team phone at [phone] for questions and concerns.   *Portions of this note are a verbal dictation therefore any spelling and/or grammatical errors are due to the "[employer]" system interpretation.

## 2023-08-12 NOTE — Progress Notes (Signed)
PROGRESS NOTE    HAWLEY LUBY  ZOX:[id number] DOB: January 20, 1981 DOA: 07/09/2023 PCP: [name], NP  Chief Complaint  Patient presents with   Altered Mental Status    Hospital Course:  42 yo M w/ pertinent PMH ESRD on HD MWF, IDDM, gastroparesis, left AKA, HTN presents to [facility] on 10/11 w/ AMS. Patient recently admitted to [facility] on 7/12 sepsis from right diabetic foot ulcer. Cultures w/ MRSA. Patient had dialysis catheter removed and replaced. ID treated w/ vanc. Patient refused echo and left on 7/17 against medical advice.  On 10/11 patient was found by family member on ground altered. EMS arrived and dialysis catheter partially removed. Patient more alert for them. Has been complaining of diarrhea x1 week per family. Unsure if patient went to dialysis today. Transferred to [facility] ED. On arrival bp stable 107/70 and afebrile. Patient remains confused and occasionally with agitation. Given dilaudid. CBG 497. Beta H >8. VBG ph 7.06, 16, 54, 4.7. Given iv fluids and started on insulin drip for DKA. CT head negative for acute abnormality. Cxr no significant findings. CT abd/pelvis no acute findings; possible steatosis vs. Liver mass 7 x 4 cm recommending f/u w/ MRI. WBC 20.7 and LA 2.4. Sepsis protocol initiated. Cultures obtained and started on vanc/zosyn. Given ams, dka, and soft bp pccm consulted for icu admission.   10/21 pccm called overnight to assess for worsening hypoxia and AMS after vomiting. On NRB w/ sats 70-80s. Hypoglycemic given dextrose which improved his mental status some. Transferred to 2H icu for intubation.    10/12: altered mental status, DKA 10/12: blood cultures positive for MRSA 10/13 TTE LVEF 25-30% w/ severely depressed LVF, global hypokinesis, gd I diastolic dysfxn, mod RV fxn reduction. Possible filamentous structure on MV 10/16 TEE LVEF 30-35%RV fxn nml. Large mass (2.00 cm x 0.88 cm ) on the posterior mitral leaflet which appeared to be extending in the mitral  annulus. There are two mobile masses visible on the anterior leaflet of the tricupid valve which also appears to have a perforation. Another mobile  mass ( 0.68 cm x 0.67 cm) on the posterior tricupid leaflet. Moderate Tricuspid regurgitation which appears to  be through the leaflet perforation  10/16 seen by Cardiothoracic surg. Felt poor candidate. Recommended 6 weeks abx and recommended repeat TEE 1 week 10/17 noting left arm weakness. Not clear when started.  MRI showed Right MCA infarct w/ acute infarcts in left occipital and bilateral cerebellum felt c/w septic emboli. Neuro consulted.  10/18 tunneled left femoral vasc cath placed after catheter holiday  10/20 palliative care consulted later that day worsening hypoxia. Sats 70s. After vomiting. More lethargic  10/21: resp failure requiring intubation shortly after midnight  10/22- TEE 10/22 with EF 35%, LV global hypokinesis. Very large vegetation lateral scallop of posterior mitral leaflet 3.9cm length 1 cm width, highly mobile, prolapsing into LV. No large vegetation seen on TV.  Later that afternoon and abrupt hemodynamic changes requiring escalation of vasopressor support.  Felt given the size of his vegetation and increased bulk of the vegetation infections unlikely to clear.  He was felt to be decompensation may have due to of breath leaflet perforation and acute mitral valve regurgb 10/23: per nursing less responsive than yesterday, increased ETT secretions. Clotting off CRRT. Ct head showing expected evolution of the right MCA stroke.  Cardiothoracic surgery re-consulted for worsening MVE. Felt too high risk here. Recommended referral to tertiary care  10/24 agitated when sedation decreased, is purposeful but does  not follow commands. Tolerating UF gaols of -150. Still on NE infusion but extubated later that afternoon after passing SBT 10/25 failed swallow eval still on just 3 lpm O2. Encephalopathic. Added seroquel in effort to get him off  the precedex. Cardiology told no beds at [facility]. Not clear if accepted or not. Worsening pressor requirements. Got blood for hgb 6. Cefepime resumed for clinical decline and leukocytosis  10/26 Hemoglobin again dropped to 6.3 overnight with increased pressor requirement yesterday afternoon now on vaso and levo. 10/27 Hypoglycemic episode overnight, mental status improved a little over weekend  10/28 Getting another unit of blood more awake.  Oriented x 2.  Still on Precedex.  Having liquid stools. 10/30 Off Precedex, low-dose levo.  Plan to transition to IHD 10/29 Cefepime for aspiration course completed.  10/31 Seen by overnight team for hypoxia and somnolence ABG with mild hypercapnia. Again declined by [facility] because they were at capacity. More awake. Doing better w/ Bed side swallow but still likely aspirating. CRRT stopped.  11/1 Adjustments made for better glycemic control.  Much more awake but a little confused 11/4 Remains on low dose levo 11/5; weaned off pressors and transferred to progressive care unit Patient was extubated, transferred to progressive care unit under [facility] service on 08/04/2023   11/7-called [facility], at capacity.  Declined transfer 11/12-femoral line removed; palliative care consulted for goals of care 11/12-called [facility] for transfer, declined due to capacity 11/13 no changes. Will pursue LTACH options 11/14 no changes. Calmer today. Pt's mother, [name], would like to cont full aggressive care. Interested in PEG   Subjective: Patient is calm and evaluation today.  There were no acute events overnight.  Patient does not directly answer questions this morning but instead repeats back all questions asked of him.  Bedside RN reports that he has not had issues with agitation today. Palliative care discussed care options with the patient's mother, [name] on the phone today.  [name] would like to continue with full scope aggressive care at this time.  I also called [name] this  afternoon.  During my phone call [name] reports she would like time to gather her questions, and is requesting a call back tomorrow.  She was not interested in discussing his care today.    Objective: Vitals:   08/12/23 0018 08/12/23 0400 08/12/23 0559 08/12/23 0739  BP: 124/89 136/83  130/85  Pulse: (!) 110 (!) 118  (!) 123  Resp: 18 16  (!) 22  Temp: 99 F (37.2 C) 99 F (37.2 C)  98.5 F (36.9 C)  TempSrc: Oral Oral  Oral  SpO2: 96% 97%  99%  Weight:   75.8 kg   Height:        Intake/Output Summary (Last 24 hours) at 08/12/2023 0755 Last data filed at 08/11/2023 1700 Gross per 24 hour  Intake 120 ml  Output 2500 ml  Net -2380 ml   Filed Weights   08/11/23 0853 08/11/23 1303 08/12/23 0559  Weight: 80 kg 77.5 kg 75.8 kg    Examination:  General exam: Appears calm and comfortable  Respiratory system: Clear to auscultation. Respiratory effort normal. Cardiovascular system: S1 & S2 heard, RRR.  Gastrointestinal system: Abdomen is nondistended, soft. TTP in epigastrium. No organomegaly or masses felt.  Central nervous system: Alert and intermittently aggressive Extremities: s/p L AKA healed appropriately Skin: tattoos Psychiatry: calm with intermittent verbal aggression.  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)   Hemiparesis affecting left side as late effect of stroke ([name])   Dysphagia due to recent stroke   Physical deconditioning   Cavitary pneumonia   Subclavian vein occlusion, bilateral ([name])   Acute blood loss anemia   Fatty liver   Liver mass   Insulin dependent type 1 diabetes mellitus ([name])   Diabetic gastroparesis ([name])   Septic shock ([name])   Anemia of chronic disease   Iron deficiency anemia, unspecified   Secondary hyperparathyroidism of renal origin ([name])    Thrombocytopenia ([name])   Acute metabolic encephalopathy   MRSA bacteremia   Right elbow pain   ESRD on hemodialysis ([name])   Acute systolic CHF (congestive heart failure) ([name])   DCM (dilated cardiomyopathy) ([name])   Coronary artery calcification seen on CAT scan   Infective endocarditis of tricuspid valve   Protein-calorie malnutrition, severe   Hx of AKA (above knee amputation), left ([name])   Cerebrovascular accident (CVA) due to embolism of cerebral artery ([name])    Evolving cavitary PNA and mucous plugging Suspect cavitation is embolic from tricuspid valve endocarditis. - Supplemental O2 support for O2 sat > 90%, on RA at present - Pulmonary hygiene (IS/flutter, mobilization as able) - Vancomycin per ID recs - Obtain CT 4-6 weeks   Negative HIV status -Patient spit in the eye of one of the nurses last week, health at work started her on antiretroviral therapy for HIV - All labs in the epic are negative for HIV -HIV antibody from July 2024 was negative -HIV RNA was ordered which was negative; no clear evidence that patient has HIV   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  -shock resolved - Off of vasopressors, consider droxidopa if requiring resumption - Continue midodrine with dialysis only.  Blood pressure elevated now. 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 - Low dose metoprolol resumed 11/13 - Definitive management is valve repair/replacement - Cardiology/TCTS consulted, recommend transfer to tertiary care center due to high-risk patient/procedure - [facility] has denied transfer x 2 (capacity), [facility] for multiple prohibitive [facility] risk [facility]  [facility]. Again called [facility] on 08/10/2023; declined due to capacity)   Ongoing sinus tachycardia HR improved some after reinitiating metop on 11/13. Still 110s, will trial higher dose as BP tolerates. Cont Cardiac monitoring. Remains very high risk of decompensation. - Optimize electrolytes for K > 4, Mg > 2 Discontinued midodrine 11/13.   Potentially 2/2 to DVT   LUE DVT Dopplers 11/4 positive for L IJ, L [place], L axillary veins; septic versus VTE. - Heparin gtt started for [facility] --Now to apixaban    Acute metabolic encephalopathy w/ agitated delirium  -Resolved, back to baseline Acute Right MCA stroke/septic infarct w/ left sided hemiparesis   MRA Brain 10/17 ?R M2 occlusion, found to have R MCA territory infarct, favored to be septic emboli. - Delirium precautions - Klonopin PRN - Continue Seroquel - PT/OT/SLP as able to participate in care   End-stage renal disease on hemodialysis (MWF at baseline) Has occluded bilateral [place] veins  Mild hyperkalemia off CRRT - Nephrology following, appreciate assistance - Successfully transitioned to [facility], last  HD 11/13 SP line holiday w/ old TDC removed 10/16 and new TDC placed 10/18 by IR.  - Trend BMP - Replete electrolytes as indicated - Monitor I&Os - Avoid nephrotoxic agents as able - Ensure adequate renal perfusion   Dysphagia - TF via Cortrak -Speech therapy working with pt -Started on dysphagia 1 diet with nectar thick liquid but continues to have difficulty sustaining adequate p.o. nutrition.  Bedside RN reports he requires significant prompting and is often too agitated to complete a meal. At most he is eating ~30%. Unlikely to be successful at this level of intake at an LTAC.  Patient would benefit from PEG tube.  Attempted to have this conversation with the patient's mother on the phone today, however she was not interested in discussing.  She did report to palliative care team that she would like fullscope aggressive  care and would be interested in PEG. Will consult IR tomorrow when mother is willing to discuss with me and consent. - Cont diet with SLP for now. Tube feeds to supplement.    T2DM w/ hyperglycemia and hypoglycemia, brittle Basal/bolus with SSI, titrate as needed. - Increase semglee to 15u every day today give BG uptrending - tube feeding at 55 cc/h -Continue CBG to 4 hours    Anemia and thrombocytopenia critical illness S/p 5 units total PRBC. -Hemoglobin is currently stable, cont to follow   Incidental finding of Liver steatosis vs. liver mass CT A/P showing 7 x 4 cm density in anterior liver. - Outpatient surveillance/GI follow up   Hyponatremia On HD, expect further resolution  Goals of care Overall poor prognosis.  Unfortunately patient remains high risk of decompensation at any time.  Will continue with IV antibiotics for now but given size of vegetation and inability to proceed with surgical intervention, further complicated by multiple comorbid conditions, patient is unlikely to have complete recovery.  Palliative care team has discussed GOC many times with the patient's mom, [name], who wishes to proceed with full aggressive treatment. [name] was not interested in speaking with me today but has requested a phone call tomorrow.  At this point, best plan is to place PEG to secure adequate nutrition and discharge to [facility].  Will order for PEG when I am able to ascertain consent from pt's mother, as pt himself lacks capacity.      DVT prophylaxis: Eliquis Code Status: Full Family Communication: none present at bedside today Disposition:   Status is: Inpatient. Discuss with case management, may be able to transition to LTAC now. Pending further palliative care discussions   Consultants:  Palliative care Nephrology ID   Antimicrobials:  Anti-infectives (From admission, onward)    Start     Dose/Rate Route Frequency Ordered Stop   08/02/23 1200  vancomycin (VANCOREADY)  IVPB 750 mg/150 mL        750 mg 150 mL/hr over 60 Minutes Intravenous Every M-W-F (Hemodialysis) 07/31/23 1404     07/30/23 1800  vancomycin (VANCOREADY) IVPB 750 mg/150 mL        750 mg 150 mL/hr over 60 Minutes Intravenous Every M-W-F (Hemodialysis) 07/30/23 1358 07/30/23 1923   07/29/23 0812  vancomycin variable dose per unstable renal function (pharmacist dosing)  Status:  Discontinued         Does not apply See admin instructions 07/29/23 0813 07/31/23 1404   07/28/23 1000  vancomycin (VANCOREADY) IVPB 750 mg/150 mL  Status:  Discontinued        750 mg 150 mL/hr over 60 Minutes  Intravenous Every 24 hours 07/27/23 1539 07/29/23 0813   07/27/23 1630  vancomycin (VANCOREADY) IVPB 500 mg/100 mL        500 mg 100 mL/hr over 60 Minutes Intravenous  Once 07/27/23 1539 07/27/23 1759   07/23/23 1945  ceFEPIme (MAXIPIME) 2 g in sodium chloride 0.9 % 100 mL IVPB  Status:  Discontinued        2 g 200 mL/hr over 30 Minutes Intravenous Every 12 hours 07/23/23 1856 07/27/23 1053   07/21/23 1200  vancomycin (VANCOREADY) IVPB 750 mg/150 mL  Status:  Discontinued        750 mg 150 mL/hr over 60 Minutes Intravenous Every M-W-F (Hemodialysis) 07/19/23 1106 07/20/23 0842   07/21/23 0230  vancomycin (VANCOREADY) IVPB 750 mg/150 mL  Status:  Discontinued        750 mg 150 mL/hr over 60 Minutes Intravenous Every 24 hours 07/20/23 1451 07/27/23 1314   07/20/23 2200  ceFEPIme (MAXIPIME) 2 g in sodium chloride 0.9 % 100 mL IVPB  Status:  Discontinued        2 g 200 mL/hr over 30 Minutes Intravenous Every 12 hours 07/20/23 1451 07/21/23 1108   07/19/23 2200  ceFEPIme (MAXIPIME) 1 g in sodium chloride 0.9 % 100 mL IVPB  Status:  Discontinued        1 g 200 mL/hr over 30 Minutes Intravenous Every 24 hours 07/19/23 1409 07/20/23 1451   07/19/23 2000  ceFEPIme (MAXIPIME) 2 g in sodium chloride 0.9 % 100 mL IVPB  Status:  Discontinued        2 g 200 mL/hr over 30 Minutes Intravenous Every M-W-F 07/19/23 0110  07/19/23 1409   07/19/23 0200  ceFEPIme (MAXIPIME) 2 g in sodium chloride 0.9 % 100 mL IVPB        2 g 200 mL/hr over 30 Minutes Intravenous  Once 07/19/23 0110 07/19/23 0221   07/18/23 1015  vancomycin (VANCOREADY) IVPB 750 mg/150 mL        750 mg 150 mL/hr over 60 Minutes Intravenous  Once 07/18/23 0929 07/19/23 1137   07/16/23 0000  ceFAZolin (ANCEF) IVPB 2g/100 mL premix        2 g 200 mL/hr over 30 Minutes Intravenous To Radiology 07/15/23 1408 07/16/23 1250   07/14/23 1200  vancomycin (VANCOREADY) IVPB 750 mg/150 mL  Status:  Discontinued        750 mg 150 mL/hr over 60 Minutes Intravenous Every M-W-F (Hemodialysis) 07/12/23 1203 07/19/23 1106   07/11/23 2330  vancomycin (VANCOREADY) IVPB 750 mg/150 mL        750 mg 150 mL/hr over 60 Minutes Intravenous  Once 07/11/23 2030 07/12/23 0110   07/11/23 1800  vancomycin (VANCOREADY) IVPB 750 mg/150 mL  Status:  Discontinued        750 mg 150 mL/hr over 60 Minutes Intravenous  Once 07/11/23 0856 07/11/23 1810   07/10/23 2318  vancomycin variable dose per unstable renal function (pharmacist dosing)  Status:  Discontinued         Does not apply See admin instructions 07/10/23 2319 07/12/23 1203   07/10/23 1429  vancomycin (VANCOREADY) IVPB 750 mg/150 mL  Status:  Discontinued        750 mg 150 mL/hr over 60 Minutes Intravenous Every Dialysis 07/10/23 1429 07/11/23 2030   07/10/23 1230  fidaxomicin (DIFICID) tablet 200 mg  Status:  Discontinued        200 mg Oral 2 times daily 07/10/23 1139 07/10/23 1207   07/10/23 1000  piperacillin-tazobactam (ZOSYN) IVPB 2.25 g  Status:  Discontinued        2.25 g 100 mL/hr over 30 Minutes Intravenous Every 8 hours 07/10/23 0908 07/10/23 1350   07/09/23 2330  vancomycin (VANCOREADY) IVPB 1750 mg/350 mL        1,750 mg 175 mL/hr over 120 Minutes Intravenous  Once 07/09/23 2329 07/10/23 0734   07/09/23 2330  piperacillin-tazobactam (ZOSYN) IVPB 3.375 g        3.375 g 100 mL/hr over 30 Minutes  Intravenous  Once 07/09/23 2329 07/10/23 0240       Data Reviewed: I have personally reviewed following labs and imaging studies  CBC: Recent Labs  Lab 08/06/23 0740 08/07/23 0540 08/08/23 0430 08/09/23 0651 08/11/23 0906  WBC 16.0* 13.4* 22.3* 12.6* 12.2*  NEUTROABS  --   --   --   --  9.7*  HGB 8.0* 7.9* 7.8* 7.1* 7.5*  HCT 26.3* 25.7* 26.3* 23.1* 24.6*  MCV 96.3 97.3 97.4 95.9 96.1  PLT 355 302 271 265 294    Basic Metabolic Panel: Recent Labs  Lab 08/08/23 0430 08/09/23 0651 08/09/23 1130 08/10/23 0500 08/11/23 0223 08/11/23 0906 08/12/23 0236  NA  --   --  133*  --   --  128*  --   K  --   --  4.4  --   --  4.2  --   CL  --   --  98  --   --  90*  --   CO2  --   --  22  --   --  27  --   GLUCOSE  --   --  209*  --   --  191*  --   BUN  --   --  86*  --   --  65*  --   CREATININE  --   --  6.46*  --   --  6.04*  --   CALCIUM  --   --  9.0  --   --  9.4  --   MG 2.8* 2.9*  --  2.6* 3.0*  --  2.8*  PHOS  --   --  4.7*  --   --   --   --     GFR: Estimated Creatinine Clearance: 14.7 mL/min (A) (by C-G formula based on SCr of 6.04 mg/dL (H)).  Liver Function Tests: Recent Labs  Lab 08/09/23 1130 08/11/23 0906  AST  --  21  ALT  --  14  ALKPHOS  --  173*  BILITOT  --  0.7  PROT  --  6.8  ALBUMIN 2.0* 2.0*    CBG: Recent Labs  Lab 08/11/23 0300 08/11/23 0621 08/11/23 1502 08/11/23 2133 08/12/23 0619  GLUCAP 93 169* 262* 283* 335*     No results found for this or any previous visit (from the past 240 hour(s)).       Radiology Studies: No results found.      Scheduled Meds:  apixaban  5 mg Per Tube BID   calcitRIOL  1.5 mcg Per Tube Q M,W,F   Chlorhexidine Gluconate Cloth  6 each Topical Q0600   [START ON 08/13/2023] darbepoetin (ARANESP) injection - DIALYSIS  100 mcg Subcutaneous Q Fri-1800   feeding supplement (NEPRO CARB STEADY)  1,000 mL Per Tube Q24H   feeding supplement (NEPRO CARB STEADY)  237 mL Oral TID WC   feeding  supplement (PROSource TF20)  60 mL Per Tube Daily  fiber supplement (BANATROL TF)  60 mL Per Tube BID   gabapentin  300 mg Per Tube QHS   insulin aspart  0-5 Units Subcutaneous QHS   insulin aspart  0-9 Units Subcutaneous TID WC   insulin aspart  3 Units Subcutaneous TID WC   insulin glargine-yfgn  10 Units Subcutaneous Daily   metoprolol tartrate  12.5 mg Per Tube BID   [START ON 08/13/2023] midodrine  10 mg Per Tube Q M,W,F-HD   multivitamin  1 tablet Per Tube QHS   nutrition supplement (JUVEN)  1 packet Per Tube BID BM   mouth rinse  15 mL Mouth Rinse 4 times per day   QUEtiapine  50 mg Per Tube QHS   QUEtiapine  50 mg Per Tube Q0600   Continuous Infusions:  vancomycin 750 mg (08/11/23 1053)     LOS: 33 days    Time spent:     Debarah Crape, DO [facility]   To contact the attending provider between 7A-7P or the covering provider during after hours 7P-7A, please log into the web site [email or web address] and access using universal [place] password for that web site. If you do not have the password, please call the hospital operator.  08/12/2023, 7:55 AM

## 2023-08-12 NOTE — Plan of Care (Signed)
  Problem: Clinical Measurements: Goal: Ability to maintain clinical measurements within normal limits will improve 08/12/2023 0706 by Lennice Sites, RN Outcome: Progressing 08/12/2023 0301 by Lennice Sites, RN Outcome: Progressing Goal: Will remain free from infection 08/12/2023 0706 by Lennice Sites, RN Outcome: Progressing 08/12/2023 0301 by Lennice Sites, RN Outcome: Progressing Goal: Diagnostic test results will improve 08/12/2023 0706 by Lennice Sites, RN Outcome: Progressing 08/12/2023 0301 by Lennice Sites, RN Outcome: Progressing Goal: Respiratory complications will improve 08/12/2023 0706 by Lennice Sites, RN Outcome: Progressing 08/12/2023 0301 by Lennice Sites, RN Outcome: Progressing

## 2023-08-12 NOTE — Progress Notes (Signed)
[facility]  Date of Admission:  07/09/2023     Total days of antibiotics 34         ASSESSMENT:  [name] continues to remain fever free in the setting of MRSA mitral and tricuspid valve endocarditis. Central line removed. Dialysis catheter side appears clean/dry. Discussed plan to continue current dose of vancomycin with dialysis. Continuing to work on potential transfer to tertiary facility for cardiothoracic surgery intervention. Remaining medical and supportive care per Internal Medicine.   PLAN:  Continue current dose of vancomycin with dialysis.  ID will follow intermittently. Remaining medical and supportive care per Internal Medicine and Nephrology.   Principal Problem:   Endocarditis of mitral valve Active Problems:   Insulin dependent type 1 diabetes mellitus ([name])   Diabetic gastroparesis ([name])   Septic shock ([name])   Anemia of chronic disease   Iron deficiency anemia, unspecified   Secondary hyperparathyroidism of renal origin ([name])   Thrombocytopenia ([name])   Acute metabolic encephalopathy   MRSA bacteremia   Right elbow pain   ESRD on hemodialysis ([name])   Acute systolic CHF (congestive heart failure) ([name])   DCM (dilated cardiomyopathy) ([name])   Coronary artery calcification seen on CAT scan   Infective endocarditis of tricuspid valve   Protein-calorie malnutrition, severe   Cavitary pneumonia   Perforation of leaflet of mitral valve   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)   Hemiparesis affecting left side as late effect of stroke ([name])   Dysphagia due to recent stroke   Cardiogenic shock ([name])   Acute blood loss anemia   Hx of AKA (above knee amputation), left ([name])   Subclavian vein occlusion, bilateral ([name])   Fatty liver   Liver mass   Physical deconditioning   Cerebrovascular accident (CVA) due to embolism of cerebral artery ([name])    apixaban  5 mg Per Tube BID   calcitRIOL  1.5 mcg Per Tube  Q M,W,F   Chlorhexidine Gluconate Cloth  6 each Topical Q0600   [START ON 08/13/2023] darbepoetin (ARANESP) injection - DIALYSIS  100 mcg Subcutaneous Q Fri-1800   feeding supplement (NEPRO CARB STEADY)  1,000 mL Per Tube Q24H   feeding supplement (NEPRO CARB STEADY)  237 mL Oral TID WC   feeding supplement (PROSource TF20)  60 mL Per Tube Daily   fiber supplement (BANATROL TF)  60 mL Per Tube BID   gabapentin  300 mg Per Tube QHS   insulin aspart  0-5 Units Subcutaneous QHS   insulin aspart  0-9 Units Subcutaneous TID WC   insulin aspart  3 Units Subcutaneous TID WC   insulin glargine-yfgn  13 Units Subcutaneous Daily   metoprolol tartrate  12.5 mg Per Tube BID   [START ON 08/13/2023] midodrine  10 mg Per Tube Q M,W,F-HD   multivitamin  1 tablet Per Tube QHS   nutrition supplement (JUVEN)  1 packet Per Tube BID BM   mouth rinse  15 mL Mouth Rinse 4 times per day   QUEtiapine  50 mg Per Tube QHS   QUEtiapine  50 mg Per Tube Q0600    SUBJECTIVE:  Afebrile overnight with no acute events. Has been restless/agitated at times. Just received dilaudid prior to visit.   No Known Allergies   Review of Systems: Review of Systems  Unable to perform ROS: Other      OBJECTIVE: Vitals:   08/12/23 0559 08/12/23 0739 08/12/23 0908 08/12/23 1128  BP:  130/85 130/85  105/70  Pulse:  (!) 123 (!) 112 (!) 110  Resp:  (!) 22  17  Temp:  98.5 F (36.9 C)  98.6 F (37 C)  TempSrc:  Oral  Oral  SpO2:  99%  96%  Weight: 75.8 kg     Height:       Body mass index is 26.16 kg/m.  Physical Exam Constitutional:      General: [name] is not in acute distress.    Appearance: [name] is well-developed.  Cardiovascular:     Rate and Rhythm: Regular rhythm. Tachycardia present.     Heart sounds: Normal heart sounds.  Pulmonary:     Effort: Pulmonary effort is normal.     Breath sounds: Normal breath sounds.  Skin:    General: Skin is warm and dry.  Neurological:     Mental Status: [name] is lethargic.      Lab Results Lab Results  Component Value Date   WBC 12.2 (H) 08/11/2023   HGB 7.5 (L) 08/11/2023   HCT 24.6 (L) 08/11/2023   MCV 96.1 08/11/2023   PLT 294 08/11/2023    Lab Results  Component Value Date   CREATININE 6.04 (H) 08/11/2023   BUN 65 (H) 08/11/2023   NA 128 (L) 08/11/2023   K 4.2 08/11/2023   CL 90 (L) 08/11/2023   CO2 27 08/11/2023    Lab Results  Component Value Date   ALT 14 08/11/2023   AST 21 08/11/2023   ALKPHOS 173 (H) 08/11/2023   BILITOT 0.7 08/11/2023     Microbiology: No results found for this or any previous visit (from the past 240 hour(s)).   Shane Eke, NP [facility]  08/12/2023  11:56 AM

## 2023-08-12 NOTE — Progress Notes (Signed)
Nutrition Follow-up  DOCUMENTATION CODES:   Severe malnutrition in context of chronic illness  INTERVENTION:   - Recommend considering PEG tube placement  Given ongoing poor PO intake, continue tube feeds via Cortrak to meet 100% of estimated needs: - Nepro Carb Steady @ 55 ml/hr (1320 ml/day) - PROSource TF20 60 ml daily  Tube feeding regimen provides 2416 kcal, 127 grams of protein, and 960 ml of H2O.  - Recommend adding insulin coverage every 4 hours for tube feeds  - d/c Nepro Shake as pt has been refusing  - Trial Mighty Shake TID with meals, each supplement provides 330 kcals and 9 grams of protein  - Diet advancement per SLP  - Continue renal MVI daily per tube   - Continue 1 packet Juven BID per tube to support wound healing, each packet provides 95 calories, 2.5 grams of protein, and 9.8 grams of carbohydrate   - Continue Banatrol TF 60 ml BID per tube  NUTRITION DIAGNOSIS:   Severe Malnutrition related to chronic illness as evidenced by severe fat depletion, severe muscle depletion.  Ongoing, being addressed via enteral nutrition support  GOAL:   Patient will meet greater than or equal to 90% of their needs  Met via enteral nutrition support  MONITOR:   Labs, I & O's, Weight trends, Diet advancement, TF tolerance, Skin  REASON FOR ASSESSMENT:   Ventilator    ASSESSMENT:   42 y/o male with h/o CHF, ESRD on HD, DM, R AKA and gastroparesis who is admitted with DKA, AMS, septic shock, MRSA bacteremia, endocarditis of mitral valve, cavitary pneumonia and combined systolic/diastolic heart failure with cardiogenic shock complicatedby CVA noted on 10/17, thrombocytopenia and liver mass.  10/22 - CRRT start, TEE confirming large MV vegetation with AML perforation 10/24 - extubated 10/25 - Cortrak placed (tip near pylorus) 10/30 - CRRT off 11/03 - TF changed to Nepro 11/05 - Cortrak clogged and removed, replaced with NG tube 11/06 - pt removed NG tube,  Cortrak placed (tip distal stomach) 11/08 - diet advanced to dysphagia 1 with nectar-thick liquids 11/10 - TF stopped due to high blood sugar 11/11 - TF resumed  Per notes, continuing to work on potential transfer to tertiary facility for CTS intervention.  Discussed pt with MD and SLP. Noted multiple nursing notes detailing pt refusing to eat when offered. Pt did not eat breakfast this morning. Shared recommendation for PEG with MD. Per Palliative Medicine Team note, pt's mother would be open to PEG tube placement if medically necessary.  Spoke with pt and family member at bedside. Pt reports that he really dislikes the food on the dysphagia 1 diet. He is requesting a double cheeseburger. Reiterated importance of adhering to current diet order at this time. Discussed with pt and family member that SLP continues to follow pt for potential diet advancement. Per Palliative note, pt's mother reports that pt is a picky eater at baseline. Noted pt has been refusing Nepro Shakes. Will d/c these and trial Mighty Shakes with meal trays.  Last HD was yesterday with 2500 ml net UF. Post-HD weight was 77.5 kg.  Admit weight: 77.6 kg Current weight: 75.8 kg  Meal Completion:  11/11: 20%, 10%, 0%  11/12: no documentation  11/13: 20%, 30%  Current TF: Nepro Carb Steady @ 55 ml/hr, PROSource TF20 60 ml daily  Medications reviewed and include: calcitriol with HD, aranesp weekly, Nepro Shake TID, banatrol TF BID, SSI, novolog 3 units TID with meals, semglee 13 units daily, rena-vit, Juven  BID, IV abx  Labs reviewed: sodium 128, chloride 90, magnesium 2.8, WBC 12.2, hemoglobin 7.5 CBG's: 62-335 x 24 hours  I/O's: +16.0 L since admit  Diet Order:   Diet Order             DIET - DYS 1 Room service appropriate? Yes with Assist; Fluid consistency: Nectar Thick  Diet effective now                   EDUCATION NEEDS:   Not appropriate for education at this time  Skin:  Skin Assessment: Skin  Integrity Issues: Stage II: R buttocks Other: skin tear to sacrum (small open red area in between buttocks), red abrasion to left buttocks  Last BM:  08/11/23 FMS  Height:   Ht Readings from Last 1 Encounters:  07/19/23 5\' 7"  (1.702 m)    Weight:   Wt Readings from Last 1 Encounters:  08/12/23 75.8 kg    Ideal Body Weight:  61.9 kg (adjusted for L AKA)  BMI:  Body mass index is 26.16 kg/m.  Estimated Nutritional Needs:   Kcal:  2200-2500kcal/day  Protein:  120-135g/day  Fluid:  UOP +1L    Mertie Clause, MS, RD, LDN Registered Dietitian II Please see AMiON for contact information.

## 2023-08-12 NOTE — Plan of Care (Signed)
  Problem: Activity: Goal: Risk for activity intolerance will decrease Outcome: Progressing   Problem: Nutrition: Goal: Adequate nutrition will be maintained Outcome: Progressing   Problem: Coping: Goal: Level of anxiety will decrease Outcome: Progressing   Problem: Safety: Goal: Ability to remain free from injury will improve Outcome: Progressing   

## 2023-08-12 NOTE — Plan of Care (Signed)
°  Problem: Clinical Measurements: °Goal: Ability to maintain clinical measurements within normal limits will improve °Outcome: Progressing °Goal: Will remain free from infection °Outcome: Progressing °Goal: Diagnostic test results will improve °Outcome: Progressing °Goal: Respiratory complications will improve °Outcome: Progressing °  °

## 2023-08-12 NOTE — Progress Notes (Signed)
SLP Cancellation Note  Patient Details Name: [name] MRN: [id number] DOB: [birth year]   Cancelled treatment: [name] very sleepy today.  Will continue efforts as schedule allows.              [name] 08/12/2023, 9:52 AM

## 2023-08-12 NOTE — Progress Notes (Signed)
[facility] Progress Note  Subjective: seen in room, no c/o's, in restraints  Vitals:   08/12/23 0559 08/12/23 0739 08/12/23 0908 08/12/23 1128  BP:  130/85 130/85 105/70  Pulse:  (!) 123 (!) 112 (!) 110  Resp:  (!) 22  17  Temp:  98.5 F (36.9 C)  98.6 F (37 C)  TempSrc:  Oral  Oral  SpO2:  99%  96%  Weight: 75.8 kg     Height:        Exam: Gen: Awake, alert, NG tube on  CVS: Regular tachycardia, S1 and S2 Resp: Transmitted breath sounds, no distinct rales or rhonchi, [place] O2 Abd: Soft, flat, nontender, bowel sounds normal Ext: Status post left AKA, no edema right leg. Bilat UE nonpitting edema.  Access: Left femoral TDC in place    Renal-related home meds: - sensipar 120 at bedtime - gabapentin 100mg  - renvavite - renvela 3 ac tid    OP HD: MWF [facility] 4h  400/1.5    77kg   2/3 bath  [facility] TDC    Heparin none - last OP HD 10/7, post wt 74.6kg - misses HD once every other week approx - rocaltrol 1.50 mcg    Assessment/ Plan: MRSA bacteremia with mitral valve endocarditis: Suspected to be related to catheter related bacteremia. SP line holiday w/ old TDC removed 10/16 and new TDC placed 10/18 by IR.  Remains on intravenous vancomycin. Options being explored for transfer to tertiary center for mitral valve repair. 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  ESRD: on HD MWF. SP course of CRRT 10/22- 10/30. Next HD Friday.  Volume: on 2-4 lpm [place] O2. Bilat UE edema. Cont to lower vol w/ HD as tolerated.  HD access: SP line holiday with new L  femoral TDC placed on 10/18. 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  Anemia: Hb 7- 9 range. ESA dose increased to 100 mcg q. weekly. 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 Monitor on renal diet.  On calcitriol for PTH control. Nutrition: On dysphagia 1 diet following swallow evaluation and ongoing tube  feeds. Hypotension: Weaned off of pressors. Will dc midodrine given good BP's now.      Vinson Moselle MD  [facility] 08/12/2023, 3:51 PM  Recent Labs  Lab 08/09/23 0651 08/09/23 1130 08/11/23 0906  HGB 7.1*  --  7.5*  ALBUMIN  --  2.0* 2.0*  CALCIUM  --  9.0 9.4  PHOS  --  4.7*  --   CREATININE  --  6.46* 6.04*  K  --  4.4 4.2   No results for input(s): "IRON", "TIBC", "FERRITIN" in the last 168 hours. Inpatient medications:  apixaban  5 mg Per Tube BID   calcitRIOL  1.5 mcg Per Tube Q M,W,F   Chlorhexidine Gluconate Cloth  6 each Topical Q0600   [START ON 08/13/2023] darbepoetin (ARANESP) injection - DIALYSIS  100 mcg Subcutaneous Q Fri-1800   feeding supplement (NEPRO CARB STEADY)  1,000 mL Per Tube Q24H   feeding supplement (PROSource TF20)  60 mL Per Tube Daily   fiber supplement (BANATROL TF)  60 mL Per Tube BID   gabapentin  300 mg Per Tube QHS   insulin aspart  0-5 Units Subcutaneous QHS   insulin aspart  0-9 Units Subcutaneous TID WC   insulin aspart  3 Units Subcutaneous TID WC   [START ON 08/13/2023] insulin glargine-yfgn  15 Units Subcutaneous Daily   metoprolol tartrate  12.5 mg Per Tube BID   [START ON 08/13/2023] midodrine  10 mg Per Tube Q M,W,F-HD   multivitamin  1 tablet Per Tube QHS   nutrition supplement (JUVEN)  1 packet Per Tube BID BM   mouth rinse  15 mL Mouth Rinse 4 times per day   QUEtiapine  50 mg Per Tube QHS   QUEtiapine  50 mg Per Tube Q0600    vancomycin 750 mg (08/11/23 1053)   acetaminophen (TYLENOL) oral liquid 160 mg/5 mL, albuterol, clonazePAM, Gerhardt's butt cream, haloperidol lactate, oxyCODONE **OR** HYDROmorphone (DILAUDID) injection, lip balm, loperamide HCl, mouth rinse

## 2023-08-13 LAB — COMPREHENSIVE METABOLIC PANEL
ALT: 11 U/L (ref 0–44)
AST: 21 U/L (ref 15–41)
Albumin: 2.3 g/dL — ABNORMAL LOW (ref 3.5–5.0)
Alkaline Phosphatase: 154 U/L — ABNORMAL HIGH (ref 38–126)
Anion gap: 13 (ref 5–15)
BUN: 80 mg/dL — ABNORMAL HIGH (ref 6–20)
CO2: 23 mmol/L (ref 22–32)
Calcium: 9.8 mg/dL (ref 8.9–10.3)
Chloride: 92 mmol/L — ABNORMAL LOW (ref 98–111)
Creatinine, Ser: 6.11 mg/dL — ABNORMAL HIGH (ref 0.61–1.24)
GFR, Estimated: 11 mL/min — ABNORMAL LOW (ref >60)
Glucose, Bld: 316 mg/dL — ABNORMAL HIGH (ref 70–99)
Potassium: 4.9 mmol/L (ref 3.5–5.1)
Sodium: 128 mmol/L — ABNORMAL LOW (ref 135–145)
Total Bilirubin: 0.6 mg/dL (ref <1.2)
Total Protein: 7.4 g/dL (ref 6.5–8.1)

## 2023-08-13 LAB — CBC WITH DIFFERENTIAL/PLATELET
Abs Immature Granulocytes: 0.05 10*3/uL (ref 0.00–0.07)
Basophils Absolute: 0 10*3/uL (ref 0.0–0.1)
Basophils Relative: 0 %
Eosinophils Absolute: 0.6 10*3/uL — ABNORMAL HIGH (ref 0.0–0.5)
Eosinophils Relative: 5 %
HCT: 27.6 % — ABNORMAL LOW (ref 39.0–52.0)
Hemoglobin: 8.4 g/dL — ABNORMAL LOW (ref 13.0–17.0)
Immature Granulocytes: 1 %
Lymphocytes Relative: 16 %
Lymphs Abs: 1.7 10*3/uL (ref 0.7–4.0)
MCH: 29.2 pg (ref 26.0–34.0)
MCHC: 30.4 g/dL (ref 30.0–36.0)
MCV: 95.8 fL (ref 80.0–100.0)
Monocytes Absolute: 0.6 10*3/uL (ref 0.1–1.0)
Monocytes Relative: 6 %
Neutro Abs: 7.8 10*3/uL — ABNORMAL HIGH (ref 1.7–7.7)
Neutrophils Relative %: 72 %
Platelets: 323 10*3/uL (ref 150–400)
RBC: 2.88 MIL/uL — ABNORMAL LOW (ref 4.22–5.81)
RDW: 16.9 % — ABNORMAL HIGH (ref 11.5–15.5)
WBC: 10.8 10*3/uL — ABNORMAL HIGH (ref 4.0–10.5)
nRBC: 0 % (ref 0.0–0.2)

## 2023-08-13 LAB — GLUCOSE, CAPILLARY
Glucose-Capillary: 171 mg/dL — ABNORMAL HIGH (ref 70–99)
Glucose-Capillary: 175 mg/dL — ABNORMAL HIGH (ref 70–99)
Glucose-Capillary: 295 mg/dL — ABNORMAL HIGH (ref 70–99)
Glucose-Capillary: 347 mg/dL — ABNORMAL HIGH (ref 70–99)
Glucose-Capillary: 80 mg/dL (ref 70–99)

## 2023-08-13 LAB — MAGNESIUM: Magnesium: 3.2 mg/dL — ABNORMAL HIGH (ref 1.7–2.4)

## 2023-08-13 LAB — VANCOMYCIN, RANDOM: Vancomycin Rm: 22 ug/mL

## 2023-08-13 LAB — PHOSPHORUS: Phosphorus: 4.8 mg/dL — ABNORMAL HIGH (ref 2.5–4.6)

## 2023-08-13 MED ORDER — JUVEN PO PACK
1.0000 | PACK | Freq: Two times a day (BID) | ORAL | Status: DC
  Administered 2023-08-14 – 2023-08-15 (×4): 1 via ORAL
  Filled 2023-08-13 (×4): qty 1

## 2023-08-13 MED ORDER — ACETAMINOPHEN 160 MG/5ML PO SOLN
650.0000 mg | Freq: Four times a day (QID) | ORAL | Status: DC | PRN
  Administered 2023-08-15: 650 mg via ORAL
  Filled 2023-08-13: qty 20.3

## 2023-08-13 MED ORDER — OXYCODONE HCL 5 MG PO TABS: 7.5000 mg | ORAL_TABLET | ORAL | Status: DC | PRN

## 2023-08-13 MED ORDER — MIDODRINE HCL 5 MG PO TABS
10.0000 mg | ORAL_TABLET | ORAL | Status: DC
  Administered 2023-08-16: 10 mg via ORAL
  Filled 2023-08-13: qty 2

## 2023-08-13 MED ORDER — QUETIAPINE FUMARATE 50 MG PO TABS
50.0000 mg | ORAL_TABLET | Freq: Every day | ORAL | Status: DC
  Administered 2023-08-14 – 2023-08-18 (×4): 50 mg via ORAL
  Filled 2023-08-13 (×4): qty 1

## 2023-08-13 MED ORDER — LOPERAMIDE HCL 1 MG/7.5ML PO SUSP
2.0000 mg | ORAL | Status: DC | PRN
  Administered 2023-08-18 (×2): 2 mg via ORAL
  Filled 2023-08-13 (×4): qty 15

## 2023-08-13 MED ORDER — QUETIAPINE FUMARATE 50 MG PO TABS
50.0000 mg | ORAL_TABLET | Freq: Every day | ORAL | Status: DC
  Administered 2023-08-13 – 2023-08-14 (×2): 50 mg via ORAL
  Filled 2023-08-13 (×2): qty 1

## 2023-08-13 MED ORDER — CALCITRIOL 1 MCG/ML PO SOLN
1.5000 ug | ORAL | Status: DC
  Administered 2023-08-18: 1.5 ug via ORAL
  Filled 2023-08-13: qty 1.5

## 2023-08-13 MED ORDER — HYDROMORPHONE HCL 1 MG/ML IJ SOLN
1.0000 mg | INTRAMUSCULAR | Status: DC | PRN
  Administered 2023-08-13 – 2023-08-14 (×2): 1 mg via INTRAVENOUS
  Filled 2023-08-13 (×2): qty 1

## 2023-08-13 MED ORDER — METOPROLOL TARTRATE 12.5 MG HALF TABLET
12.5000 mg | ORAL_TABLET | Freq: Two times a day (BID) | ORAL | Status: DC
  Administered 2023-08-14: 12.5 mg via ORAL
  Filled 2023-08-13 (×3): qty 1

## 2023-08-13 MED ORDER — APIXABAN 5 MG PO TABS
5.0000 mg | ORAL_TABLET | Freq: Two times a day (BID) | ORAL | Status: DC
  Administered 2023-08-13 – 2023-08-18 (×9): 5 mg via ORAL
  Filled 2023-08-13 (×9): qty 1

## 2023-08-13 MED ORDER — RENA-VITE PO TABS
1.0000 | ORAL_TABLET | Freq: Every day | ORAL | Status: DC
  Administered 2023-08-13 – 2023-08-17 (×4): 1 via ORAL
  Filled 2023-08-13 (×4): qty 1

## 2023-08-13 MED ORDER — GABAPENTIN 300 MG PO CAPS
300.0000 mg | ORAL_CAPSULE | Freq: Every day | ORAL | Status: DC
  Administered 2023-08-13 – 2023-08-17 (×4): 300 mg via ORAL
  Filled 2023-08-13 (×4): qty 1

## 2023-08-13 MED ORDER — CLONAZEPAM 0.5 MG PO TABS: 0.5000 mg | ORAL_TABLET | Freq: Two times a day (BID) | ORAL | Status: DC | PRN

## 2023-08-13 MED ORDER — HEPARIN SODIUM (PORCINE) 1000 UNIT/ML IJ SOLN
4600.0000 [IU] | INTRAMUSCULAR | Status: DC | PRN
Start: 2023-08-13 — End: 2023-08-28
  Administered 2023-08-18 – 2023-08-27 (×3): 4600 [IU] via INTRAVENOUS
  Filled 2023-08-13: qty 4.6
  Filled 2023-08-13: qty 5
  Filled 2023-08-13 (×2): qty 4.6
  Filled 2023-08-13: qty 5
  Filled 2023-08-13: qty 4.6

## 2023-08-13 NOTE — Progress Notes (Signed)
Pharmacy Antibiotic Note  [name] is a 42 y.o. 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 Pharmacy has been consulted for Vancomycin dosing.  Vancomycin random today pre- HD is 22 and well within range. Next HD planned today.   Plan: - Vancomycin 750 mg IV q HD MWF  - Will monitor HD tolerance and dosing - Plan weekly levels on Friday with note  - Tentative stop date: 09/19/23 - entered    Height: 5\' 7"  (170.2 cm) Weight: 76.2 kg (168 lb) IBW/kg (Calculated) : 66.1  Temp (24hrs), Avg:98.7 F (37.1 C), Min:97.6 F (36.4 C), Max:99.9 F (37.7 C)  Recent Labs  Lab 08/07/23 0540 08/08/23 0430 08/09/23 0651 08/09/23 1130 08/11/23 0906 08/13/23 0800  WBC 13.4* 22.3* 12.6*  --  12.2* 10.8*  CREATININE  --   --   --  6.46* 6.04* 6.11*  VANCORANDOM  --   --   --   --   --  22    Estimated Creatinine Clearance: 14.6 mL/min (A) (by C-G formula based on SCr of 6.11 mg/dL (H)).    No Known Allergies  Antimicrobials this admission: Vancomycin 10/12 >>(12/22) Cefepime 10/21 >> 10/23; restart 10/25 >> 10/29  Microbiology results: 10/12 MRSA PCR >> positive 10/12 CDiff antigen +, toxin -, PCR - >> negative 10/12 BCx >> 4/4 MRSA 10/13 BCx >> NGf 10/17 BCx >> NGf 10/21 RCx >> NOF  Thank you for allowing pharmacy to be a part of this patient's care.  Sharin Mons, PharmD, BCPS, BCIDP Infectious Diseases Clinical Pharmacist Phone: [phone] 08/13/2023 11:08 AM

## 2023-08-13 NOTE — Progress Notes (Signed)
Transported to  hemodialysis by bed awake and alert. 

## 2023-08-13 NOTE — Plan of Care (Signed)

## 2023-08-13 NOTE — Progress Notes (Signed)
Physical Therapy Treatment Patient Details Name: [name] MRN: [id number] DOB: 22-Feb-1981 Today's Date: 08/13/2023   History of Present Illness Patient is a 42 y/o male admitted 07/09/23 with confusion found to be in septic shock found to have MRSA in blood cultures, in DKA, TEE positive for vegitation on multiple valves, MRI showing R MCA infarct.  Pt with resp failure on 10/21 resulting in intubation and transfer to [facility]; extubated 10/24.  CRRT started 10/22-stopped 10/30.  PMH positive for ESRD on HD, IDDM, gastroparesis, L AKA, HTN.    PT Comments  Pt is progressing towards goals. Pt was requiring less assistance this session from last session. Currently pt is Mod a +2 for supine<>sitting and was able to scoot EOB at Mod a +1 toward the R. Pt was able to roll at Max a and assist with min to moderate multi modal cueing for sequencing to decrease need for physical assistance. Due to current functional status, co-morbidities, home set up and available assistance at home continuing to recommend skilled physical therapy services in long term acute care hospital setting in order to decrease risk for falls, injury and re-hospitalization.  Pt was very pleasant throughout session and tolerated session well.    If plan is discharge home, recommend the following: Two people to help with walking and/or transfers;Assist for transportation;Supervision due to cognitive status;Help with stairs or ramp for entrance   Can travel by private vehicle     No  Equipment Recommendations  Hoyer lift;Wheelchair (measurements PT);Wheelchair cushion (measurements PT)       Precautions / Restrictions Precautions Precautions: Fall Precaution Comments: LAKA, L hemiparesis, R wrist and ankle restraint, flexiseal, coretrak, blood clot LUE Restrictions Weight Bearing Restrictions: Yes LLE Weight Bearing: Non weight bearing     Mobility  Bed Mobility Overal bed mobility: Needs Assistance Bed Mobility: Supine to  Sit, Sit to Supine Rolling: Max assist Sidelying to sit: Mod assist, +2 for safety/equipment   Sit to supine: Mod assist, +2 for physical assistance   General bed mobility comments: Improved mobility from last session. Pt was able to assist. Has difficulty due to flaccid LUE; able to follow directions to decrease need for assist with mobility.    Transfers     General transfer comment: lateral scoot toward HOB +1 Mod A with heavy RUE use for pt. Pt requires tactile and verbal cues for body mechanics.    Ambulation/Gait     General Gait Details: unable    Modified Rankin (Stroke Patients Only) Modified Rankin (Stroke Patients Only) Pre-Morbid Rankin Score: Moderate disability Modified Rankin: Severe disability     Balance Overall balance assessment: Needs assistance Sitting-balance support: Single extremity supported, Bilateral upper extremity supported, Feet supported Sitting balance-[name]: Poor Sitting balance - Comments: Sat EOB x 15 minutes with CGA to Max A Pt pushing heavily with RUE intermittently but able to correct when asked. Worked on reaching forward with RUE for COM over BOS Postural control: Left lateral lean     Standing balance comment: NT        Cognition Arousal: Alert Behavior During Therapy: Flat affect Overall Cognitive Status: Impaired/Different from baseline       Following Commands: Follows one step commands consistently Safety/Judgement: Decreased awareness of safety     General Comments: Pt continues to request a W/C asked if he has one at home and pt states he does. He called his mother during session to ask if she could bring wheel chair. Overall pleasant today.  General Comments General comments (skin integrity, edema, etc.): No significant deviations during session with vital signs.      Pertinent Vitals/Pain Pain Assessment Pain Score: 6  Pain Descriptors / Indicators: Discomfort Pain Intervention(s): Patient  requesting pain meds-RN notified, Monitored during session     PT Goals (current goals can now be found in the care plan section) Acute Rehab PT Goals Patient Stated Goal: none stated PT Goal Formulation: Patient unable to participate in goal setting Time For Goal Achievement: 08/25/23 Potential to Achieve Goals: Fair Progress towards PT goals: Progressing toward goals    Frequency    Min 1X/week      PT Plan  Continue with current POC       AM-PAC PT "6 Clicks" Mobility   Outcome Measure  Help needed turning from your back to your side while in a flat bed without using bedrails?: A Lot Help needed moving from lying on your back to sitting on the side of a flat bed without using bedrails?: A Lot Help needed moving to and from a bed to a chair (including a wheelchair)?: Total Help needed standing up from a chair using your arms (e.g., wheelchair or bedside chair)?: Total Help needed to walk in hospital room?: Total Help needed climbing 3-5 steps with a railing? : Total 6 Click Score: 8    End of Session   Activity Tolerance: Patient tolerated treatment well Patient left: in bed;with call bell/phone within reach;with bed alarm set Nurse Communication: Mobility status PT Visit Diagnosis: Other abnormalities of gait and mobility (R26.89);Other symptoms and signs involving the nervous system (R29.898);Hemiplegia and hemiparesis Hemiplegia - Right/Left: Left Hemiplegia - dominant/non-dominant: Non-dominant Hemiplegia - caused by: Cerebral infarction     Time: 1400-1430 PT Time Calculation (min) (ACUTE ONLY): 30 min  Charges:    $Therapeutic Activity: 23-37 mins PT General Charges $$ ACUTE PT VISIT: 1 Visit                     Harrel Carina, DPT, CLT  Acute Rehabilitation Services Office: [phone] (Secure chat preferred)    Claudia Desanctis 08/13/2023, 3:10 PM

## 2023-08-13 NOTE — Progress Notes (Signed)
Still in the hemodialysis during  shift change.Marland Kitchen

## 2023-08-13 NOTE — Progress Notes (Signed)
[facility] Progress Note  Subjective: seen in room, no c/o's, in restraints  Vitals:   08/13/23 0548 08/13/23 0831 08/13/23 1150 08/13/23 1200  BP:  (!) 143/73 138/74   Pulse:  (!) 114 87   Resp:  20  20  Temp:  97.6 F (36.4 C) 98.1 F (36.7 C)   TempSrc:  Axillary Oral   SpO2:    94%  Weight: 76.2 kg     Height:        Exam: Gen: groggy today, NG tube on  CVS: Regular tachycardia, S1 and S2 Resp: Transmitted breath sounds, no distinct rales or rhonchi,  O2 Abd: Soft, flat, nontender, bowel sounds normal Ext: Status post left AKA, no edema right leg. Bilat UE nonpitting edema.  Access: Left femoral TDC in place    Renal-related home meds: - sensipar 120 at bedtime - gabapentin 100mg  - renvavite - renvela 3 ac tid    OP HD: MWF [facility] 4h  400/1.5    77kg   2/3 bath  [facility] TDC    Heparin none - last OP HD 10/7, post wt 74.6kg - misses HD once every other week approx - rocaltrol 1.50 mcg    Assessment/ Plan: MRSA bacteremia with mitral valve endocarditis: Suspected to be related to catheter related bacteremia. SP line holiday w/ old TDC removed 10/16 and new TDC placed 10/18 by IR.  Remains on intravenous vancomycin.  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  ESRD: on HD MWF. SP course of CRRT 10/22- 10/30. Now back on iHD. Next HD today. Volume: was on 4 L then 2L and now is on RA. Bilat upper ext non-pitting edema is resolving. Wt's are down , 1kg under dry wt now. Cont to lower vol w/ HD as tolerated.   Anemia: Hb 7- 9 range. ESA dose increased to 100 mcg q. weekly. 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 Monitor on renal diet.  On calcitriol for PTH control. Nutrition: On dysphagia 1 diet following swallow evaluation and ongoing tube feeds. Hypotension: Weaned off of pressors. DC'd the midodrine support.  HD access: SP line holiday with new L  femoral TDC placed on 10/18. 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     Vinson Moselle MD  [facility] 08/13/2023, 2:21 PM  Recent Labs  Lab 08/09/23 1130 08/11/23 0906 08/13/23 0800  HGB  --  7.5* 8.4*  ALBUMIN 2.0* 2.0* 2.3*  CALCIUM 9.0 9.4 9.8  PHOS 4.7*  --  4.8*  CREATININE 6.46* 6.04* 6.11*  K 4.4 4.2 4.9   No results for input(s): "IRON", "TIBC", "FERRITIN" in the last 168 hours. Inpatient medications:  apixaban  5 mg Oral BID   [START ON 08/16/2023] calcitRIOL  1.5 mcg Oral Q M,W,F   Chlorhexidine Gluconate Cloth  6 each Topical Q0600   darbepoetin (ARANESP) injection - DIALYSIS  100 mcg Subcutaneous Q Fri-1800   feeding supplement (NEPRO CARB STEADY)  1,000 mL Per Tube Q24H   feeding supplement (PROSource TF20)  60 mL Per Tube Daily   fiber supplement (BANATROL TF)  60 mL Per Tube BID   gabapentin  300 mg Oral QHS   insulin aspart  0-5 Units Subcutaneous QHS   insulin aspart  0-9 Units Subcutaneous TID WC   insulin aspart  3 Units Subcutaneous TID WC   insulin glargine-yfgn  15 Units Subcutaneous Daily   metoprolol tartrate  12.5  mg Oral BID   midodrine  10 mg Oral Q M,W,F-HD   multivitamin  1 tablet Oral QHS   nutrition supplement (JUVEN)  1 packet Oral BID BM   mouth rinse  15 mL Mouth Rinse 4 times per day   QUEtiapine  50 mg Oral QHS   [START ON 08/14/2023] QUEtiapine  50 mg Oral Q0600    vancomycin 750 mg (08/11/23 1053)   acetaminophen (TYLENOL) oral liquid 160 mg/5 mL, albuterol, clonazePAM, Gerhardt's butt cream, haloperidol lactate, oxyCODONE **OR** HYDROmorphone (DILAUDID) injection, lip balm, loperamide HCl, mouth rinse

## 2023-08-13 NOTE — Progress Notes (Signed)
   08/13/23 1859  Vitals  Temp 98.6 F (37 C)  Temp Source Oral  BP (!) 126/54  MAP (mmHg) 75  BP Location Right Arm  BP Method Automatic  Patient Position (if appropriate) Lying  Pulse Rate (!) 110  ECG Heart Rate (!) 110  Resp 12  Oxygen Therapy  SpO2 100 %  O2 Device Room Air  During Treatment Monitoring  Blood Flow Rate (mL/min) 199 mL/min  Arterial Pressure (mmHg) -79.39 mmHg  Venous Pressure (mmHg) 65.05 mmHg  TMP (mmHg) 10.91 mmHg  Ultrafiltration Rate (mL/min) 0 mL/min  Dialysate Flow Rate (mL/min) 300 ml/min  Dialysate Potassium Concentration 2  Dialysate Calcium Concentration 2.5  Duration of HD Treatment -hour(s) 3.2 hour(s)  Cumulative Fluid Removed (mL) per Treatment  2200.47  HD Safety Checks Performed Yes  Intra-Hemodialysis Comments Tolerated well;Tx completed  Dialysis Fluid Bolus Normal Saline  Bolus Amount (mL) 300 mL  Post Treatment  Dialyzer Clearance Lightly streaked  Hemodialysis Intake (mL) 0 mL  Liters Processed 74.4  Fluid Removed (mL) 2200 mL  Tolerated HD Treatment Yes  Post-Hemodialysis Comments tx completed  Hemodialysis Catheter Left Femoral vein Double lumen Permanent (Tunneled)  Placement Date/Time: 07/16/23 1318   Serial / Lot #: [id number]  Expiration Date: 05/24/26  Time Out: Correct patient;Correct site;Correct procedure  Maximum sterile barrier precautions: Hand hygiene;Cap;Mask;Sterile gown;Sterile gloves;Large sterile ...  Site Condition No complications  Blue Lumen Status Dead end cap in place  Red Lumen Status Dead end cap in place  Purple Lumen Status N/A  Catheter fill solution Heparin 1000 units/ml  Catheter fill volume (Arterial) 2.3 cc  Catheter fill volume (Venous) 2.3  Dressing Type Transparent  Dressing Status Antimicrobial disc in place  Drainage Description None  Post treatment catheter status Capped and Clamped   Received patient in bed to unit.  Alert and oriented.  Informed consent signed and in chart.   TX  duration:  Patient tolerated well.  Transported back to the room  Alert, without acute distress.  Hand-off given to patient's nurse.   Access used: lt femoral hd cath Access issues: none  Total UF removed: 2200 Medication(s) given: vancomycin 750mg , dilaudid 1mg  Post HD VS: see above Post HD weight: n/a   [facility]

## 2023-08-13 NOTE — Progress Notes (Signed)
PROGRESS NOTE    [name]  ZOX:[id number] DOB: [birth year] DOA: 07/09/2023 PCP: [name], NP  Chief Complaint  Patient presents with   Altered Mental Status    Hospital Course:  42 yo M w/ pertinent PMH ESRD on HD MWF, IDDM, gastroparesis, left AKA, HTN presents to [facility] on 10/11 w/ AMS. Patient recently admitted to [facility] on 7/12 sepsis from right diabetic foot ulcer. Cultures w/ MRSA. Patient had dialysis catheter removed and replaced. ID treated w/ vanc. Patient refused echo and left on 7/17 against medical advice.  On 10/11 patient was found by family member on ground altered. EMS arrived and dialysis catheter partially removed. Patient more alert for them. Has been complaining of diarrhea x1 week per family. Unsure if patient went to dialysis today. Transferred to [facility] ED. On arrival bp stable 107/70 and afebrile. Patient remains confused and occasionally with agitation. Given dilaudid. CBG 497. Beta H >8. VBG ph 7.06, 16, 54, 4.7. Given iv fluids and started on insulin drip for DKA. CT head negative for acute abnormality. Cxr no significant findings. CT abd/pelvis no acute findings; possible steatosis vs. Liver mass 7 x 4 cm recommending f/u w/ MRI. WBC 20.7 and LA 2.4. Sepsis protocol initiated. Cultures obtained and started on vanc/zosyn. Given ams, dka, and soft bp pccm consulted for icu admission.   10/21 pccm called overnight to assess for worsening hypoxia and AMS after vomiting. On NRB w/ sats 70-80s. Hypoglycemic given dextrose which improved his mental status some. Transferred to 2H icu for intubation.    10/12: altered mental status, DKA 10/12: blood cultures positive for MRSA 10/13 TTE LVEF 25-30% w/ severely depressed LVF, global hypokinesis, gd I diastolic dysfxn, mod RV fxn reduction. Possible filamentous structure on MV 10/16 TEE LVEF 30-35%RV fxn nml. Large mass (2.00 cm x 0.88 cm ) on the posterior mitral leaflet which appeared to be extending in the mitral  annulus. There are two mobile masses visible on the anterior leaflet of the tricupid valve which also appears to have a perforation. Another mobile  mass ( 0.68 cm x 0.67 cm) on the posterior tricupid leaflet. Moderate Tricuspid regurgitation which appears to  be through the leaflet perforation  10/16 seen by Cardiothoracic surg. Felt poor candidate. Recommended 6 weeks abx and recommended repeat TEE 1 week 10/17 noting left arm weakness. Not clear when started.  MRI showed Right MCA infarct w/ acute infarcts in left occipital and bilateral cerebellum felt c/w septic emboli. Neuro consulted.  10/18 tunneled left femoral vasc cath placed after catheter holiday  10/20 palliative care consulted later that day worsening hypoxia. Sats 70s. After vomiting. More lethargic  10/21: resp failure requiring intubation shortly after midnight  10/22- TEE 10/22 with EF 35%, LV global hypokinesis. Very large vegetation lateral scallop of posterior mitral leaflet 3.9cm length 1 cm width, highly mobile, prolapsing into LV. No large vegetation seen on TV.  Later that afternoon and abrupt hemodynamic changes requiring escalation of vasopressor support.  Felt given the size of his vegetation and increased bulk of the vegetation infections unlikely to clear.  He was felt to be decompensation may have due to of breath leaflet perforation and acute mitral valve regurgb 10/23: per nursing less responsive than yesterday, increased ETT secretions. Clotting off CRRT. Ct head showing expected evolution of the right MCA stroke.  Cardiothoracic surgery re-consulted for worsening MVE. Felt too high risk here. Recommended referral to tertiary care  10/24 agitated when sedation decreased, is purposeful but does  not follow commands. Tolerating UF gaols of -150. Still on NE infusion but extubated later that afternoon after passing SBT 10/25 failed swallow eval still on just 3 lpm O2. Encephalopathic. Added seroquel in effort to get him off  the precedex. Cardiology told no beds at [facility]. Not clear if accepted or not. Worsening pressor requirements. Got blood for hgb 6. Cefepime resumed for clinical decline and leukocytosis  10/26 Hemoglobin again dropped to 6.3 overnight with increased pressor requirement yesterday afternoon now on vaso and levo. 10/27 Hypoglycemic episode overnight, mental status improved a little over weekend  10/28 Getting another unit of blood more awake.  Oriented x 2.  Still on Precedex.  Having liquid stools. 10/30 Off Precedex, low-dose levo.  Plan to transition to IHD 10/29 Cefepime for aspiration course completed.  10/31 Seen by overnight team for hypoxia and somnolence ABG with mild hypercapnia. Again declined by [facility] because they were at capacity. More awake. Doing better w/ Bed side swallow but still likely aspirating. CRRT stopped.  11/1 Adjustments made for better glycemic control.  Much more awake but a little confused 11/4 Remains on low dose levo 11/5; weaned off pressors and transferred to progressive care unit Patient was extubated, transferred to progressive care unit under [facility] service on 08/04/2023  11/7-called [facility], at capacity.  Declined transfer 11/12-femoral line removed; palliative care consulted for goals of care 11/12-called [facility] for transfer, declined due to capacity 11/13 no changes. Will pursue LTACH options 11/14 no changes. Calmer today. Pt's mother, [name], would like to cont full aggressive care.   Subjective: No acute events overnight.  Patient is much more alert and talkative today.  He is looking forward to his aunt visiting later today.   Objective: Vitals:   08/13/23 1559 08/13/23 1629 08/13/23 1659 08/13/23 1729  BP: (!) 115/52 115/77 124/71 (!) 118/58  Pulse: (!) 111 (!) 115 (!) 113 (!) 112  Resp: 14 19 (!) 9 16  Temp:      TempSrc:      SpO2: 97%  100% 100%  Weight:      Height:        Intake/Output Summary (Last 24 hours) at 08/13/2023 1824 Last data filed at  08/13/2023 1400 Gross per 24 hour  Intake 1160 ml  Output 60 ml  Net 1100 ml   Filed Weights   08/11/23 1303 08/12/23 0559 08/13/23 0548  Weight: 77.5 kg 75.8 kg 76.2 kg    Examination:  General exam: Appears calm and comfortable  Respiratory system: Clear to auscultation. Respiratory effort normal. Cardiovascular system: S1 & S2 heard, RRR.  Gastrointestinal system: Abdomen is nondistended, soft. TTP in epigastrium. No organomegaly or masses felt.  Central nervous system: Alert and intermittently aggressive Extremities: s/p L AKA healed appropriately Skin: tattoos Psychiatry: calm with intermittent verbal aggression.  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)   Hemiparesis affecting left side as late effect of stroke ([name])   Dysphagia due to recent stroke   Physical deconditioning   Cavitary pneumonia   Subclavian vein occlusion, bilateral ([name])   Acute blood loss anemia   Fatty liver   Liver mass   Insulin dependent type 1 diabetes mellitus ([name])   Diabetic gastroparesis ([name])   Septic shock ([name])   Anemia of chronic disease   Iron deficiency anemia, unspecified  Secondary hyperparathyroidism of renal origin ([name])   Thrombocytopenia ([name])   Acute metabolic encephalopathy   MRSA bacteremia   Right elbow pain   ESRD on hemodialysis ([name])   Acute systolic CHF (congestive heart failure) ([name])   DCM (dilated cardiomyopathy) ([name])   Coronary artery calcification seen on CAT scan   Infective endocarditis of tricuspid valve   Protein-calorie malnutrition, severe   Hx of AKA (above knee amputation), left ([name])   Cerebrovascular accident (CVA) due to embolism of cerebral artery ([name])    Evolving cavitary PNA and mucous plugging Suspect cavitation is embolic from tricuspid valve  endocarditis. - Supplemental O2 support for O2 sat > 90%, on RA at present - Pulmonary hygiene (IS/flutter, mobilization as able) - Vancomycin per ID recs - Obtain CT 4-6 weeks   Negative HIV status -Patient spit in the eye of one of the nurses last week, health at work started her on antiretroviral therapy for HIV - All labs in the epic are negative for HIV -HIV antibody from July 2024 was negative -HIV RNA was ordered which was negative; no clear evidence that patient has HIV   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  -shock resolved - Off of vasopressors, consider droxidopa if requiring resumption - Continue midodrine with dialysis only.  Blood pressure elevated now. 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 - Low dose metoprolol resumed 11/13 - Definitive management is valve repair/replacement - Cardiology/TCTS consulted, recommend transfer to tertiary care center due to high-risk patient/procedure - [facility] has denied transfer x 2 (capacity), [facility] for multiple [facility]itive [facility]. Again called [facility] on 08/10/2023; declined due to capacity)   Ongoing sinus tachycardia HR improved some after reinitiating metop on 11/13. Still 110s, will trial higher dose as BP tolerates. Cont Cardiac monitoring. Remains very high risk of decompensation. - Optimize electrolytes for K > 4, Mg > 2 Discontinued midodrine 11/13.   Potentially 2/2 to DVT   LUE DVT Dopplers 11/4 positive for L IJ, L [place], L axillary veins; septic versus VTE. - Heparin gtt started for [facility] --Now to apixaban    Acute metabolic encephalopathy w/ agitated  delirium  -Resolved, back to baseline Acute Right MCA stroke/septic infarct w/ left sided hemiparesis   MRA Brain 10/17 ?R M2 occlusion, found to have R MCA territory infarct, favored to be septic emboli. - Delirium precautions - Klonopin PRN - Continue Seroquel - PT/OT/SLP as able to participate in care   End-stage renal disease on hemodialysis (MWF at baseline) Has occluded bilateral [place] veins  Mild hyperkalemia off CRRT - Nephrology following, appreciate assistance - Successfully transitioned to [facility], last HD 11/13 SP line holiday w/ old TDC removed 10/16 and new TDC placed 10/18 by IR.  - Trend BMP - Replete electrolytes as indicated - Monitor I&Os - Avoid nephrotoxic agents as able - Ensure adequate renal perfusion   Dysphagia - TF via Cortrak -Speech therapy working with pt -Started on dysphagia 1 diet with nectar thick liquid but continues to have difficulty sustaining adequate p.o. nutrition.  Bedside RN reports he requires significant prompting and is often too agitated to complete a meal. At most he is eating ~30%. Unlikely to be successful at this level of  intake at an LTAC. - Cont diet with SLP for now. Tube feeds to supplement.  -Trial of favorite foods this weekend to encourage intake, then discuss PEG again with mom "sunday   T2DM w/ hyperglycemia and hypoglycemia, brittle Basal/bolus with SSI, titrate as needed. - Increase semglee to 15u every day today give BG uptrending - tube feeding at 55 cc/h -Continue CBG to 4 hours    Anemia and thrombocytopenia critical illness S/p 5 units total PRBC. -Hemoglobin is currently stable, cont to follow   Incidental finding of Liver steatosis vs. liver mass CT A/P showing 7 x 4 cm density in anterior liver. - Outpatient surveillance/GI follow up   Hyponatremia On HD, expect further resolution  Goals of care Overall poor prognosis.  Unfortunately patient remains high risk of decompensation at any time.  Will continue with  IV antibiotics for now but given size of vegetation and inability to proceed with surgical intervention, further complicated by multiple comorbid conditions, patient is unlikely to have complete recovery.  Palliative care team has discussed GOC many times with the patient's mom, Anita, who wishes to proceed with full aggressive treatment Extensive discussion with Anita on 11/15.  We discussed PEG placement and at this time she would like more time to encourage his p.o. intake.  She reports that she is going to visit frequently this weekend and bring up his favorite foods.  She also requested transfer to [facility] which we will pursue.  Patient was denied from LTAC.  Performed peer to peer on 11/15.  LTAC reporting that without consistent nutrition source i.e. PEG tube, they will not accept patient.  They are recommending PEG tube placement and then will reevaluate when we reapply.     DVT prophylaxis: Eliquis Code Status: Full Family Communication: as above. Disposition:   Status is: Inpatient. Family still desiring transfer. See above Consultants:  Palliative care Nephrology ID   Antimicrobials:  Anti-infectives (From admission, onward)    Start     Dose/Rate Route Frequency Ordered Stop   08/02/23 1200  vancomycin (VANCOREADY) IVPB 750 mg/150 mL        750 mg 150 mL/hr over 60 Minutes Intravenous Every M-W-F (Hemodialysis) 07/31/23 1404 09/19/23 2359   07/30/23 1800  vancomycin (VANCOREADY) IVPB 750 mg/150 mL        750 mg 150 mL/hr over 60 Minutes Intravenous Every M-W-F (Hemodialysis) 07/30/23 1358 07/30/23 1923   07/29/23 0812  vancomycin variable dose per unstable renal function (pharmacist dosing)  Status:  Discontinued         Does not apply See admin instructions 07/29/23 0813 07/31/23 1404   07/28/23 1000  vancomycin (VANCOREADY) IVPB 750 mg/150 mL  Status:  Discontinued        750 mg 150 mL/hr over 60 Minutes Intravenous Every 24 hours 07/27/23 1539 07/29/23 0813    07/27/23 1630  vancomycin (VANCOREADY) IVPB 500 mg/100 mL        500 mg 100 mL/hr over 60 Minutes Intravenous  Once 07/27/23 1539 07/27/23 1759   07/23/23 1945  ceFEPIme (MAXIPIME) 2 g in sodium chloride 0.9 % 100 mL IVPB  Status:  Discontinued        2 g 200 mL/hr over 30 Minutes Intravenous Every 12 hours 07/23/23 1856 07/27/23 1053   07/21/23 1200  vancomycin (VANCOREADY) IVPB 750 mg/150 mL  Status:  Discontinued        75" 0 mg 150 mL/hr over 60 Minutes Intravenous Every M-W-F (Hemodialysis) 07/19/23 1106 07/20/23 6295  07/21/23 0230  vancomycin (VANCOREADY) IVPB 750 mg/150 mL  Status:  Discontinued        750 mg 150 mL/hr over 60 Minutes Intravenous Every 24 hours 07/20/23 1451 07/27/23 1314   07/20/23 2200  ceFEPIme (MAXIPIME) 2 g in sodium chloride 0.9 % 100 mL IVPB  Status:  Discontinued        2 g 200 mL/hr over 30 Minutes Intravenous Every 12 hours 07/20/23 1451 07/21/23 1108   07/19/23 2200  ceFEPIme (MAXIPIME) 1 g in sodium chloride 0.9 % 100 mL IVPB  Status:  Discontinued        1 g 200 mL/hr over 30 Minutes Intravenous Every 24 hours 07/19/23 1409 07/20/23 1451   07/19/23 2000  ceFEPIme (MAXIPIME) 2 g in sodium chloride 0.9 % 100 mL IVPB  Status:  Discontinued        2 g 200 mL/hr over 30 Minutes Intravenous Every M-W-F 07/19/23 0110 07/19/23 1409   07/19/23 0200  ceFEPIme (MAXIPIME) 2 g in sodium chloride 0.9 % 100 mL IVPB        2 g 200 mL/hr over 30 Minutes Intravenous  Once 07/19/23 0110 07/19/23 0221   07/18/23 1015  vancomycin (VANCOREADY) IVPB 750 mg/150 mL        750 mg 150 mL/hr over 60 Minutes Intravenous  Once 07/18/23 0929 07/19/23 1137   07/16/23 0000  ceFAZolin (ANCEF) IVPB 2g/100 mL premix        2 g 200 mL/hr over 30 Minutes Intravenous To Radiology 07/15/23 1408 07/16/23 1250   07/14/23 1200  vancomycin (VANCOREADY) IVPB 750 mg/150 mL  Status:  Discontinued        750 mg 150 mL/hr over 60 Minutes Intravenous Every M-W-F (Hemodialysis) 07/12/23 1203  07/19/23 1106   07/11/23 2330  vancomycin (VANCOREADY) IVPB 750 mg/150 mL        750 mg 150 mL/hr over 60 Minutes Intravenous  Once 07/11/23 2030 07/12/23 0110   07/11/23 1800  vancomycin (VANCOREADY) IVPB 750 mg/150 mL  Status:  Discontinued        750 mg 150 mL/hr over 60 Minutes Intravenous  Once 07/11/23 0856 07/11/23 1810   07/10/23 2318  vancomycin variable dose per unstable renal function (pharmacist dosing)  Status:  Discontinued         Does not apply See admin instructions 07/10/23 2319 07/12/23 1203   07/10/23 1429  vancomycin (VANCOREADY) IVPB 750 mg/150 mL  Status:  Discontinued        750 mg 150 mL/hr over 60 Minutes Intravenous Every Dialysis 07/10/23 1429 07/11/23 2030   07/10/23 1230  fidaxomicin (DIFICID) tablet 200 mg  Status:  Discontinued        200 mg Oral 2 times daily 07/10/23 1139 07/10/23 1207   07/10/23 1000  piperacillin-tazobactam (ZOSYN) IVPB 2.25 g  Status:  Discontinued        2.25 g 100 mL/hr over 30 Minutes Intravenous Every 8 hours 07/10/23 0908 07/10/23 1350   07/09/23 2330  vancomycin (VANCOREADY) IVPB 1750 mg/350 mL        1,750 mg 175 mL/hr over 120 Minutes Intravenous  Once 07/09/23 2329 07/10/23 0734   07/09/23 2330  piperacillin-tazobactam (ZOSYN) IVPB 3.375 g        3.375 g 100 mL/hr over 30 Minutes Intravenous  Once 07/09/23 2329 07/10/23 0240       Data Reviewed: I have personally reviewed following labs and imaging studies  CBC: Recent Labs  Lab 08/07/23 0540 08/08/23 0430  08/09/23 0651 08/11/23 0906 08/13/23 0800  WBC 13.4* 22.3* 12.6* 12.2* 10.8*  NEUTROABS  --   --   --  9.7* 7.8*  HGB 7.9* 7.8* 7.1* 7.5* 8.4*  HCT 25.7* 26.3* 23.1* 24.6* 27.6*  MCV 97.3 97.4 95.9 96.1 95.8  PLT 302 271 265 294 323    Basic Metabolic Panel: Recent Labs  Lab 08/09/23 0651 08/09/23 1130 08/10/23 0500 08/11/23 0223 08/11/23 0906 08/12/23 0236 08/13/23 0800  NA  --  133*  --   --  128*  --  128*  K  --  4.4  --   --  4.2  --  4.9   CL  --  98  --   --  90*  --  92*  CO2  --  22  --   --  27  --  23  GLUCOSE  --  209*  --   --  191*  --  316*  BUN  --  86*  --   --  65*  --  80*  CREATININE  --  6.46*  --   --  6.04*  --  6.11*  CALCIUM  --  9.0  --   --  9.4  --  9.8  MG 2.9*  --  2.6* 3.0*  --  2.8* 3.2*  PHOS  --  4.7*  --   --   --   --  4.8*    GFR: Estimated Creatinine Clearance: 14.6 mL/min (A) (by C-G formula based on SCr of 6.11 mg/dL (H)).  Liver Function Tests: Recent Labs  Lab 08/09/23 1130 08/11/23 0906 08/13/23 0800  AST  --  21 21  ALT  --  14 11  ALKPHOS  --  173* 154*  BILITOT  --  0.7 0.6  PROT  --  6.8 7.4  ALBUMIN 2.0* 2.0* 2.3*    CBG: Recent Labs  Lab 08/12/23 2128 08/12/23 2350 08/13/23 0301 08/13/23 0831 08/13/23 1150  GLUCAP 103* 122* 171* 347* 295*     No results found for this or any previous visit (from the past 240 hour(s)).       Radiology Studies: No results found.      Scheduled Meds:  apixaban  5 mg Oral BID   [START ON 08/16/2023] calcitRIOL  1.5 mcg Oral Q M,W,F   Chlorhexidine Gluconate Cloth  6 each Topical Q0600   darbepoetin (ARANESP) injection - DIALYSIS  100 mcg Subcutaneous Q Fri-1800   feeding supplement (NEPRO CARB STEADY)  1,000 mL Per Tube Q24H   feeding supplement (PROSource TF20)  60 mL Per Tube Daily   fiber supplement (BANATROL TF)  60 mL Per Tube BID   gabapentin  300 mg Oral QHS   insulin aspart  0-5 Units Subcutaneous QHS   insulin aspart  0-9 Units Subcutaneous TID WC   insulin aspart  3 Units Subcutaneous TID WC   insulin glargine-yfgn  15 Units Subcutaneous Daily   metoprolol tartrate  12.5 mg Oral BID   midodrine  10 mg Oral Q M,W,F-HD   multivitamin  1 tablet Oral QHS   nutrition supplement (JUVEN)  1 packet Oral BID BM   mouth rinse  15 mL Mouth Rinse 4 times per day   QUEtiapine  50 mg Oral QHS   [START ON 08/14/2023] QUEtiapine  50 mg Oral Q0600   Continuous Infusions:  vancomycin 750 mg (08/13/23 1742)      LOS: 34 days    Time spent:     Debarah Crape, DO [facility]   To contact the attending provider between 7A-7P or the covering provider during after hours 7P-7A, please log into the web site [email or web address] and access using universal [place] password for that web site. If you do not have the password, please call the hospital operator.  08/13/2023, 6:24 PM

## 2023-08-13 NOTE — Inpatient Diabetes Management (Signed)
Inpatient Diabetes Program Recommendations  AACE/ADA: New Consensus Statement on Inpatient Glycemic Control (2015)  Target Ranges:  Prepandial:   less than 140 mg/dL      Peak postprandial:   less than 180 mg/dL (1-2 hours)      Critically ill patients:  140 - 180 mg/dL   Lab Results  Component Value Date   GLUCAP 295 (H) 08/13/2023   HGBA1C 8.9 (H) 07/10/2023    Review of Glycemic Control  Latest Reference Range & Units 08/12/23 23:50 08/13/23 03:01 08/13/23 08:31 08/13/23 11:50  Glucose-Capillary 70 - 99 mg/dL 469 (H) 629 (H) 528 (H) 295 (H)  (H): Data is abnormally high Diabetes history: Type 1 DM Outpatient Diabetes medications: Novolin 70/30 10 units BID, Novolin R 12 units TID Current orders for Inpatient glycemic control: Novolog 0-9 units TID & HS Novolog 3 units TID Semglee 15 units every day  Nepro @ 55 ml/hr  Inpatient Diabetes Program Recommendations:    Noted increase of basal. Patient sensitive to insulin.   Consider: -Changing correction to Novolog 0-6 units Q4H -Reducing Semglee 13 units every day  Thanks, Lujean Rave, MSN, RNC-OB Diabetes Coordinator [phone] (8a-5p)

## 2023-08-14 LAB — CBC WITH DIFFERENTIAL/PLATELET
Abs Immature Granulocytes: 0.05 10*3/uL (ref 0.00–0.07)
Basophils Absolute: 0.1 10*3/uL (ref 0.0–0.1)
Basophils Relative: 1 %
Eosinophils Absolute: 0.4 10*3/uL (ref 0.0–0.5)
Eosinophils Relative: 4 %
HCT: 28.1 % — ABNORMAL LOW (ref 39.0–52.0)
Hemoglobin: 8.6 g/dL — ABNORMAL LOW (ref 13.0–17.0)
Immature Granulocytes: 1 %
Lymphocytes Relative: 12 %
Lymphs Abs: 1.2 10*3/uL (ref 0.7–4.0)
MCH: 28.7 pg (ref 26.0–34.0)
MCHC: 30.6 g/dL (ref 30.0–36.0)
MCV: 93.7 fL (ref 80.0–100.0)
Monocytes Absolute: 0.5 10*3/uL (ref 0.1–1.0)
Monocytes Relative: 5 %
Neutro Abs: 7.8 10*3/uL — ABNORMAL HIGH (ref 1.7–7.7)
Neutrophils Relative %: 77 %
Platelets: 355 10*3/uL (ref 150–400)
RBC: 3 MIL/uL — ABNORMAL LOW (ref 4.22–5.81)
RDW: 16.9 % — ABNORMAL HIGH (ref 11.5–15.5)
WBC: 10.1 10*3/uL (ref 4.0–10.5)
nRBC: 0 % (ref 0.0–0.2)

## 2023-08-14 LAB — COMPREHENSIVE METABOLIC PANEL
ALT: 11 U/L (ref 0–44)
AST: 18 U/L (ref 15–41)
Albumin: 2.3 g/dL — ABNORMAL LOW (ref 3.5–5.0)
Alkaline Phosphatase: 143 U/L — ABNORMAL HIGH (ref 38–126)
Anion gap: 12 (ref 5–15)
BUN: 47 mg/dL — ABNORMAL HIGH (ref 6–20)
CO2: 27 mmol/L (ref 22–32)
Calcium: 9.5 mg/dL (ref 8.9–10.3)
Chloride: 92 mmol/L — ABNORMAL LOW (ref 98–111)
Creatinine, Ser: 3.78 mg/dL — ABNORMAL HIGH (ref 0.61–1.24)
GFR, Estimated: 19 mL/min — ABNORMAL LOW (ref >60)
Glucose, Bld: 250 mg/dL — ABNORMAL HIGH (ref 70–99)
Potassium: 4 mmol/L (ref 3.5–5.1)
Sodium: 131 mmol/L — ABNORMAL LOW (ref 135–145)
Total Bilirubin: 0.8 mg/dL (ref <1.2)
Total Protein: 7.7 g/dL (ref 6.5–8.1)

## 2023-08-14 LAB — GLUCOSE, CAPILLARY
Glucose-Capillary: 254 mg/dL — ABNORMAL HIGH (ref 70–99)
Glucose-Capillary: 314 mg/dL — ABNORMAL HIGH (ref 70–99)
Glucose-Capillary: 263 mg/dL — ABNORMAL HIGH (ref 70–99)
Glucose-Capillary: 96 mg/dL (ref 70–99)
Glucose-Capillary: 225 mg/dL — ABNORMAL HIGH (ref 70–99)
Glucose-Capillary: 318 mg/dL — ABNORMAL HIGH (ref 70–99)

## 2023-08-14 LAB — PHOSPHORUS: Phosphorus: 3.2 mg/dL (ref 2.5–4.6)

## 2023-08-14 LAB — MAGNESIUM: Magnesium: 2.5 mg/dL — ABNORMAL HIGH (ref 1.7–2.4)

## 2023-08-14 MED ORDER — OXYCODONE HCL 5 MG PO TABS
7.5000 mg | ORAL_TABLET | ORAL | Status: DC | PRN
  Administered 2023-08-14 – 2023-08-15 (×3): 7.5 mg via ORAL
  Filled 2023-08-14 (×3): qty 2

## 2023-08-14 MED ORDER — HYDROMORPHONE HCL 1 MG/ML IJ SOLN
0.5000 mg | INTRAMUSCULAR | Status: DC | PRN
  Administered 2023-08-14 – 2023-08-15 (×3): 0.5 mg via INTRAVENOUS
  Filled 2023-08-14 (×4): qty 0.5

## 2023-08-14 NOTE — Progress Notes (Signed)
PROGRESS NOTE    [name]  VWU:[id number] DOB: 1980-12-15 DOA: 07/09/2023 PCP: [name], NP  Chief Complaint  Patient presents with   Altered Mental Status    Hospital Course:  42 yo M w/ pertinent PMH ESRD on HD MWF, IDDM, gastroparesis, left AKA, HTN presents to [facility] on 10/11 w/ AMS. Patient recently admitted to [facility] on 7/12 sepsis from right diabetic foot ulcer. Cultures w/ MRSA. Patient had dialysis catheter removed and replaced. ID treated w/ vanc. Patient refused echo and left on 7/17 against medical advice.  On 10/11 patient was found by family member on ground altered. EMS arrived and dialysis catheter partially removed. Patient more alert for them. Has been complaining of diarrhea x1 week per family. Unsure if patient went to dialysis today. Transferred to [facility] ED. On arrival bp stable 107/70 and afebrile. Patient remains confused and occasionally with agitation. Given dilaudid. CBG 497. Beta H >8. VBG ph 7.06, 16, 54, 4.7. Given iv fluids and started on insulin drip for DKA. CT head negative for acute abnormality. Cxr no significant findings. CT abd/pelvis no acute findings; possible steatosis vs. Liver mass 7 x 4 cm recommending f/u w/ MRI. WBC 20.7 and LA 2.4. Sepsis protocol initiated. Cultures obtained and started on vanc/zosyn. Given ams, dka, and soft bp pccm consulted for icu admission.   10/21 pccm called overnight to assess for worsening hypoxia and AMS after vomiting. On NRB w/ sats 70-80s. Hypoglycemic given dextrose which improved his mental status some. Transferred to 2H icu for intubation.    10/12: altered mental status, DKA 10/12: blood cultures positive for MRSA 10/13 TTE LVEF 25-30% w/ severely depressed LVF, global hypokinesis, gd I diastolic dysfxn, mod RV fxn reduction. Possible filamentous structure on MV 10/16 TEE LVEF 30-35%RV fxn nml. Large mass (2.00 cm x 0.88 cm ) on the posterior mitral leaflet which appeared to be extending in the mitral  annulus. There are two mobile masses visible on the anterior leaflet of the tricupid valve which also appears to have a perforation. Another mobile  mass ( 0.68 cm x 0.67 cm) on the posterior tricupid leaflet. Moderate Tricuspid regurgitation which appears to  be through the leaflet perforation  10/16 seen by Cardiothoracic surg. Felt poor candidate. Recommended 6 weeks abx and recommended repeat TEE 1 week 10/17 noting left arm weakness. Not clear when started.  MRI showed Right MCA infarct w/ acute infarcts in left occipital and bilateral cerebellum felt c/w septic emboli. Neuro consulted.  10/18 tunneled left femoral vasc cath placed after catheter holiday  10/20 palliative care consulted later that day worsening hypoxia. Sats 70s. After vomiting. More lethargic  10/21: resp failure requiring intubation shortly after midnight  10/22- TEE 10/22 with EF 35%, LV global hypokinesis. Very large vegetation lateral scallop of posterior mitral leaflet 3.9cm length 1 cm width, highly mobile, prolapsing into LV. No large vegetation seen on TV.  Later that afternoon and abrupt hemodynamic changes requiring escalation of vasopressor support.  Felt given the size of his vegetation and increased bulk of the vegetation infections unlikely to clear.  He was felt to be decompensation may have due to of breath leaflet perforation and acute mitral valve regurgb 10/23: per nursing less responsive than yesterday, increased ETT secretions. Clotting off CRRT. Ct head showing expected evolution of the right MCA stroke.  Cardiothoracic surgery re-consulted for worsening MVE. Felt too high risk here. Recommended referral to tertiary care  10/24 agitated when sedation decreased, is purposeful but does  not follow commands. Tolerating UF gaols of -150. Still on NE infusion but extubated later that afternoon after passing SBT 10/25 failed swallow eval still on just 3 lpm O2. Encephalopathic. Added seroquel in effort to get him off  the precedex. Cardiology told no beds at [facility]. Not clear if accepted or not. Worsening pressor requirements. Got blood for hgb 6. Cefepime resumed for clinical decline and leukocytosis  10/26 Hemoglobin again dropped to 6.3 overnight with increased pressor requirement yesterday afternoon now on vaso and levo. 10/27 Hypoglycemic episode overnight, mental status improved a little over weekend  10/28 Getting another unit of blood more awake.  Oriented x 2.  Still on Precedex.  Having liquid stools. 10/30 Off Precedex, low-dose levo.  Plan to transition to IHD 10/29 Cefepime for aspiration course completed.  10/31 Seen by overnight team for hypoxia and somnolence ABG with mild hypercapnia. Again declined by [facility] because they were at capacity. More awake. Doing better w/ Bed side swallow but still likely aspirating. CRRT stopped.  11/1 Adjustments made for better glycemic control.  Much more awake but a little confused 11/4 Remains on low dose levo 11/5; weaned off pressors and transferred to progressive care unit Patient was extubated, transferred to progressive care unit under [facility] service on 08/04/2023  11/7-called [facility], at capacity.  Declined transfer 11/12-femoral line removed; palliative care consulted for goals of care 11/12-called [facility] for transfer, declined due to capacity 11/13 no changes. Will pursue LTACH options 11/14 no changes. Calmer today. Pt's mother, [name], would like to cont full aggressive care.  11/15 discussed possibility of PEG tube with patient's mother at this time she like to continue to increase oral feeds.  Subjective: No acute events overnight.  Patient remains tachycardic this morning.  He is very somnolent on exam.  [name], [name] is at bedside.  She is concerned that the Dilaudid is causing somnolence.  She is also brought food to try his favorite foods.  We discussed that there may be some modifications we can make to make them dysphagia safe but some foods will  not be permitted on his current dysphagia diet.  She endorses understanding.  We also had additional frank discussion regarding his overall prognosis.  Objective: Vitals:   08/14/23 0300 08/14/23 0717 08/14/23 0900 08/14/23 1100  BP: (!) 120/42 (!) 100/29 (!) 122/46 (!) 102/41  Pulse: (!) 122 (!) 121  (!) 122  Resp: 15 10  14   Temp: 98.3 F (36.8 C) 98.1 F (36.7 C)  97.8 F (36.6 C)  TempSrc: Oral Oral  Oral  SpO2: 98% 94%  97%  Weight: 75.8 kg     Height:        Intake/Output Summary (Last 24 hours) at 08/14/2023 1507 Last data filed at 08/14/2023 1400 Gross per 24 hour  Intake 260 ml  Output 2200 ml  Net -1940 ml   Filed Weights   08/12/23 0559 08/13/23 0548 08/14/23 0300  Weight: 75.8 kg 76.2 kg 75.8 kg    Examination:  General exam: Appears calm and comfortable  Respiratory system: Clear to auscultation. Respiratory effort normal. Cardiovascular system: S1 & S2 heard, RRR.  Gastrointestinal system: Abdomen is nondistended, soft. TTP in epigastrium. No organomegaly or masses felt.  Central nervous system: Alert and intermittently aggressive Extremities: s/p L AKA healed appropriately Skin: tattoos Psychiatry: calm with intermittent verbal aggression.  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)   Hemiparesis affecting left side as late effect of stroke ([name])   Dysphagia due to recent stroke   Physical deconditioning   Cavitary pneumonia   Subclavian vein occlusion, bilateral ([name])   Acute blood loss anemia   Fatty liver   Liver mass   Insulin dependent type 1 diabetes mellitus ([name])   Diabetic gastroparesis ([name])   Septic shock ([name])   Anemia of chronic disease   Iron deficiency anemia, unspecified   Secondary hyperparathyroidism of renal origin ([name])   Thrombocytopenia ([name])    Acute metabolic encephalopathy   MRSA bacteremia   Right elbow pain   ESRD on hemodialysis ([name])   Acute systolic CHF (congestive heart failure) ([name])   DCM (dilated cardiomyopathy) ([name])   Coronary artery calcification seen on CAT scan   Infective endocarditis of tricuspid valve   Protein-calorie malnutrition, severe   Hx of AKA (above knee amputation), left ([name])   Cerebrovascular accident (CVA) due to embolism of cerebral artery ([name])    Evolving cavitary PNA and mucous plugging Suspect cavitation is embolic from tricuspid valve endocarditis. - Supplemental O2 support for O2 sat > 90%, on RA at present - Pulmonary hygiene (IS/flutter, mobilization as able) - Vancomycin per ID recs - Obtain CT 4-6 weeks   Negative HIV status -Patient spit in the eye of one of the nurses last week, health at work started her on antiretroviral therapy for HIV - All labs in the epic are negative for HIV -HIV antibody from July 2024 was negative -HIV RNA was ordered which was negative; no clear evidence that patient has HIV   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  -shock resolved - Off of vasopressors, consider droxidopa if requiring resumption - Continue midodrine with dialysis only.   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 - Low dose metoprolol resumed 11/13, have attempted to increase metoprolol given his persistent tachycardia but blood pressure remains low to normotensive and does not seem to tolerate increased Bblockers. - Definitive management is valve repair/replacement - Cardiology/TCTS consulted, recommend transfer to tertiary care center due to high-risk patient/procedure - [facility] has denied transfer x 2 (capacity),  [facility] denied for multiple prohibitive surgical risk [facility] for transfer on 08/05/2023, declined for capacity. Again called [facility] on 08/10/2023; declined due to capacity)   Ongoing sinus tachycardia HR improved some after reinitiating metop on 11/13. Still 110s, BP unlikely to tolerate higher dose. Have decreased diluadid today which may help BP some. Increase Metop as able. Cont Cardiac monitoring. Remains very high risk of decompensation. - Optimize electrolytes for K > 4, Mg > 2 Discontinued midodrine 11/13.   Potentially 2/2 to DVT   LUE DVT Dopplers 11/4 positive for L IJ, L [place], L axillary veins; septic versus VTE. - s/p Heparin gtt --Now on apixaban    Acute metabolic encephalopathy w/ agitated delirium  Acute Right MCA stroke/septic infarct w/ left sided hemiparesis   MRA Brain 10/17 ?R M2 occlusion, found to have R MCA territory infarct, favored to be septic emboli. -Intermittent periods of delirium and agitation.  Has been frequently aggressive with medical staff.  Continue restraints. -Somnolent today.  Will decrease Dilaudid dosing and monitor for response -  Delirium precautions - Klonopin PRN - Continue Seroquel - PT/OT/SLP as able to participate in care   End-stage renal disease on hemodialysis (MWF at baseline) Has occluded bilateral [place] veins  Mild hyperkalemia off CRRT - Nephrology following, appreciate assistance - Successfully transitioned to [facility], last HD 11/13 SP line holiday w/ old TDC removed 10/16 and new TDC placed 10/18 by IR.  - Trend BMP - Replete electrolytes as indicated - Monitor I&Os - Avoid nephrotoxic agents as able - Ensure adequate renal perfusion   Dysphagia - TF via Cortrak -Speech therapy working with pt -Started on dysphagia 1 diet with nectar thick liquid but continues to have difficulty sustaining adequate p.o. nutrition.  At current patient is too somnolent to continue to try p.o. intake. Requires  significant prompting to complete meal - Cont diet with SLP for now. Tube feeds to supplement.  -Extensive discussion with the patient's mom on 11/15 and again 11/16 regarding needing permanent source of nutrition for the patient.  At this time she is refusing PEG tube.  She would like to continue to try to increase his p.o. intake by enticing him with his favorite foods.  Unfortunately many of these are high risk of choking/aspiration.  Will continue to try to modify them as able given his dysphagia diet. -Plan to discuss PEG again with mom "sunday   T2DM w/ hyperglycemia and hypoglycemia, brittle Basal/bolus with SSI, titrate as needed. -Patient is high risk of hypoglycemic events. -Continue CBG to 4 hours    Anemia and thrombocytopenia critical illness S/p 5 units total PRBC. -Hemoglobin is currently stable, cont to follow   Incidental finding of Liver steatosis vs. liver mass CT A/P showing 7 x 4 cm density in anterior liver. - Outpatient surveillance/GI follow up   Hyponatremia On HD, expect further resolution  Goals of care Overall poor prognosis.  Unfortunately patient remains high risk of decompensation at any time.  Had extensive discussion with the patient's mother on 11/16 and 11/15 regarding his prognosis.  Explained the details of his multiorgan failure (ESRD, endocarditis, CHF, septic emboli in lungs, CVA, ongoing encephalopathy, dysphagia etc) and have consulted palliative care.  At this time patient's mom, Anita wishes to proceed with full aggressive treatment.  Unfortunately patient does not have a consistent source of nutrition.  Anita plans to continue to encourage p.o. intake this weekend while she can have frequent bedside visits. We did apply to LTAC, however patient was declined given his lack of consistent nutrition.  LTAC does report if patient has PEG tube placed we can reapply.  This was also communicated to the TOC team who is aware.  Patient has been denied transfer  to [facility] x 2 as well as [facility].     DVT prophylaxis: Eliquis Code Status: Full Family Communication: as above. Disposition:   Status is: Inpatient. Family still desiring transfer. See above Consultants:  Palliative care Nephrology ID   Antimicrobials:  Anti-infectives (From admission, onward)    Start     Dose/Rate Route Frequency Ordered Stop   08/02/23 1200  vancomycin (VANCOREADY) IVPB 750 mg/150 mL        750 mg 150 mL/hr over 60 Minutes Intravenous Every M-W-F (Hemodialysis) 07/31/23 1404 09/19/23 2359   07/30/23 1800  vancomycin (VANCOREADY) IVPB 750 mg/150 mL        75" 0 mg 150 mL/hr over 60 Minutes Intravenous Every M-W-F (Hemodialysis) 07/30/23 1358 07/30/23 1923   07/29/23 0812  vancomycin variable dose per unstable renal function (  pharmacist dosing)  Status:  Discontinued         Does not apply See admin instructions 07/29/23 0813 07/31/23 1404   07/28/23 1000  vancomycin (VANCOREADY) IVPB 750 mg/150 mL  Status:  Discontinued        750 mg 150 mL/hr over 60 Minutes Intravenous Every 24 hours 07/27/23 1539 07/29/23 0813   07/27/23 1630  vancomycin (VANCOREADY) IVPB 500 mg/100 mL        500 mg 100 mL/hr over 60 Minutes Intravenous  Once 07/27/23 1539 07/27/23 1759   07/23/23 1945  ceFEPIme (MAXIPIME) 2 g in sodium chloride 0.9 % 100 mL IVPB  Status:  Discontinued        2 g 200 mL/hr over 30 Minutes Intravenous Every 12 hours 07/23/23 1856 07/27/23 1053   07/21/23 1200  vancomycin (VANCOREADY) IVPB 750 mg/150 mL  Status:  Discontinued        750 mg 150 mL/hr over 60 Minutes Intravenous Every M-W-F (Hemodialysis) 07/19/23 1106 07/20/23 0842   07/21/23 0230  vancomycin (VANCOREADY) IVPB 750 mg/150 mL  Status:  Discontinued        750 mg 150 mL/hr over 60 Minutes Intravenous Every 24 hours 07/20/23 1451 07/27/23 1314   07/20/23 2200  ceFEPIme (MAXIPIME) 2 g in sodium chloride 0.9 % 100 mL IVPB  Status:  Discontinued        2 g 200 mL/hr over 30  Minutes Intravenous Every 12 hours 07/20/23 1451 07/21/23 1108   07/19/23 2200  ceFEPIme (MAXIPIME) 1 g in sodium chloride 0.9 % 100 mL IVPB  Status:  Discontinued        1 g 200 mL/hr over 30 Minutes Intravenous Every 24 hours 07/19/23 1409 07/20/23 1451   07/19/23 2000  ceFEPIme (MAXIPIME) 2 g in sodium chloride 0.9 % 100 mL IVPB  Status:  Discontinued        2 g 200 mL/hr over 30 Minutes Intravenous Every M-W-F 07/19/23 0110 07/19/23 1409   07/19/23 0200  ceFEPIme (MAXIPIME) 2 g in sodium chloride 0.9 % 100 mL IVPB        2 g 200 mL/hr over 30 Minutes Intravenous  Once 07/19/23 0110 07/19/23 0221   07/18/23 1015  vancomycin (VANCOREADY) IVPB 750 mg/150 mL        750 mg 150 mL/hr over 60 Minutes Intravenous  Once 07/18/23 0929 07/19/23 1137   07/16/23 0000  ceFAZolin (ANCEF) IVPB 2g/100 mL premix        2 g 200 mL/hr over 30 Minutes Intravenous To Radiology 07/15/23 1408 07/16/23 1250   07/14/23 1200  vancomycin (VANCOREADY) IVPB 750 mg/150 mL  Status:  Discontinued        750 mg 150 mL/hr over 60 Minutes Intravenous Every M-W-F (Hemodialysis) 07/12/23 1203 07/19/23 1106   07/11/23 2330  vancomycin (VANCOREADY) IVPB 750 mg/150 mL        750 mg 150 mL/hr over 60 Minutes Intravenous  Once 07/11/23 2030 07/12/23 0110   07/11/23 1800  vancomycin (VANCOREADY) IVPB 750 mg/150 mL  Status:  Discontinued        750 mg 150 mL/hr over 60 Minutes Intravenous  Once 07/11/23 0856 07/11/23 1810   07/10/23 2318  vancomycin variable dose per unstable renal function (pharmacist dosing)  Status:  Discontinued         Does not apply See admin instructions 07/10/23 2319 07/12/23 1203   07/10/23 1429  vancomycin (VANCOREADY) IVPB 750 mg/150 mL  Status:  Discontinued  750 mg 150 mL/hr over 60 Minutes Intravenous Every Dialysis 07/10/23 1429 07/11/23 2030   07/10/23 1230  fidaxomicin (DIFICID) tablet 200 mg  Status:  Discontinued        200 mg Oral 2 times daily 07/10/23 1139 07/10/23 1207   07/10/23  1000  piperacillin-tazobactam (ZOSYN) IVPB 2.25 g  Status:  Discontinued        2.25 g 100 mL/hr over 30 Minutes Intravenous Every 8 hours 07/10/23 0908 07/10/23 1350   07/09/23 2330  vancomycin (VANCOREADY) IVPB 1750 mg/350 mL        1,750 mg 175 mL/hr over 120 Minutes Intravenous  Once 07/09/23 2329 07/10/23 0734   07/09/23 2330  piperacillin-tazobactam (ZOSYN) IVPB 3.375 g        3.375 g 100 mL/hr over 30 Minutes Intravenous  Once 07/09/23 2329 07/10/23 0240       Data Reviewed: I have personally reviewed following labs and imaging studies  CBC: Recent Labs  Lab 08/08/23 0430 08/09/23 0651 08/11/23 0906 08/13/23 0800 08/14/23 0224  WBC 22.3* 12.6* 12.2* 10.8* 10.1  NEUTROABS  --   --  9.7* 7.8* 7.8*  HGB 7.8* 7.1* 7.5* 8.4* 8.6*  HCT 26.3* 23.1* 24.6* 27.6* 28.1*  MCV 97.4 95.9 96.1 95.8 93.7  PLT 271 265 294 323 355    Basic Metabolic Panel: Recent Labs  Lab 08/09/23 1130 08/10/23 0500 08/11/23 0223 08/11/23 0906 08/12/23 0236 08/13/23 0800 08/14/23 0224  NA 133*  --   --  128*  --  128* 131*  K 4.4  --   --  4.2  --  4.9 4.0  CL 98  --   --  90*  --  92* 92*  CO2 22  --   --  27  --  23 27  GLUCOSE 209*  --   --  191*  --  316* 250*  BUN 86*  --   --  65*  --  80* 47*  CREATININE 6.46*  --   --  6.04*  --  6.11* 3.78*  CALCIUM 9.0  --   --  9.4  --  9.8 9.5  MG  --  2.6* 3.0*  --  2.8* 3.2* 2.5*  PHOS 4.7*  --   --   --   --  4.8* 3.2    GFR: Estimated Creatinine Clearance: 23.6 mL/min (A) (by C-G formula based on SCr of 3.78 mg/dL (H)).  Liver Function Tests: Recent Labs  Lab 08/09/23 1130 08/11/23 0906 08/13/23 0800 08/14/23 0224  AST  --  21 21 18   ALT  --  14 11 11   ALKPHOS  --  173* 154* 143*  BILITOT  --  0.7 0.6 0.8  PROT  --  6.8 7.4 7.7  ALBUMIN 2.0* 2.0* 2.3* 2.3*    CBG: Recent Labs  Lab 08/13/23 2023 08/13/23 2357 08/14/23 0312 08/14/23 0719 08/14/23 1128  GLUCAP 80 175* 254* 314* 263*     No results found for this or  any previous visit (from the past 240 hour(s)).       Radiology Studies: No results found.      Scheduled Meds:  apixaban  5 mg Oral BID   [START ON 08/16/2023] calcitRIOL  1.5 mcg Oral Q M,W,F   Chlorhexidine Gluconate Cloth  6 each Topical Q0600   darbepoetin (ARANESP) injection - DIALYSIS  100 mcg Subcutaneous Q Fri-1800   feeding supplement (NEPRO CARB STEADY)  1,000 mL Per Tube Q24H   feeding  supplement (PROSource TF20)  60 mL Per Tube Daily   fiber supplement (BANATROL TF)  60 mL Per Tube BID   gabapentin  300 mg Oral QHS   insulin aspart  0-5 Units Subcutaneous QHS   insulin aspart  0-9 Units Subcutaneous TID WC   insulin aspart  3 Units Subcutaneous TID WC   insulin glargine-yfgn  15 Units Subcutaneous Daily   metoprolol tartrate  12.5 mg Oral BID   midodrine  10 mg Oral Q M,W,F-HD   multivitamin  1 tablet Oral QHS   nutrition supplement (JUVEN)  1 packet Oral BID BM   mouth rinse  15 mL Mouth Rinse 4 times per day   QUEtiapine  50 mg Oral QHS   QUEtiapine  50 mg Oral Q0600   Continuous Infusions:  vancomycin 750 mg (08/13/23 1742)     LOS: 35 days    Time spent:     Debarah Crape, DO [facility]   To contact the attending provider between 7A-7P or the covering provider during after hours 7P-7A, please log into the web site [email or web address] and access using universal [place] password for that web site. If you do not have the password, please call the hospital operator.  08/14/2023, 3:07 PM

## 2023-08-14 NOTE — Plan of Care (Signed)
  Problem: Education: Goal: Knowledge of General Education information will improve Description: Including pain rating scale, medication(s)/side effects and non-pharmacologic comfort measures Outcome: Not Progressing   Problem: Health Behavior/Discharge Planning: Goal: Ability to manage health-related needs will improve Outcome: Not Progressing   

## 2023-08-14 NOTE — Progress Notes (Signed)
[facility] Progress Note  Subjective: seen in room, no c/o's, remains in restraints  Vitals:   08/13/23 1859 08/13/23 2005 08/13/23 2300 08/14/23 0300  BP: (!) 126/54 (!) 154/49 (!) 123/37 (!) 120/42  Pulse: (!) 110 (!) 111  (!) 122  Resp: 12 13 14 15   Temp: 98.6 F (37 C) 98.3 F (36.8 C) 98.8 F (37.1 C) 98.3 F (36.8 C)  TempSrc: Oral Oral Oral Oral  SpO2: 100% 99% 94% 98%  Weight:    75.8 kg  Height:        Exam: Gen: alert, NG tube on  CVS: Regular tachycardia, S1 and S2 Resp: Transmitted breath sounds, no distinct rales or rhonchi, [place] O2 Abd: Soft, flat, nontender, bowel sounds normal Ext: Status post left AKA, no edema right leg. Upper ext edema has resolved Access: Left femoral TDC in place    Renal-related home meds: - sensipar 120 at bedtime - gabapentin 100mg  - renvavite - renvela 3 ac tid    OP HD: MWF [facility] 4h  400/1.5    77kg   2/3 bath  [facility] TDC    Heparin none - last OP HD 10/7, post wt 74.6kg - misses HD once every other week approx - rocaltrol 1.50 mcg    Assessment/ Plan: MRSA bacteremia with mitral valve endocarditis: Suspected to be related to catheter related bacteremia. SP line holiday w/ old TDC removed 10/16 and new TDC placed 10/18 by IR.  Remains on intravenous vancomycin.  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  ESRD: on HD MWF. SP course of CRRT 10/22- 10/30. Now back on iHD, had dialysis yesterday. Next HD Monday.  Volume: [place] O2 weaned down and is now off. Euvolemic on exam now, 1kg under dry wt.  Anemia: Hb 7- 9 range. ESA dose increased to 100 mcg q. weekly. 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 Monitor on renal diet.  On calcitriol for PTH control. Nutrition: On dysphagia 1 diet following swallow evaluation and ongoing tube feeds. Hypotension: Weaned off of pressors and the po midodrine HD access: SP line holiday with new L  femoral TDC placed on 10/18.  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     Vinson Moselle MD  [facility] 08/14/2023, 6:50 AM  Recent Labs  Lab 08/13/23 0800 08/14/23 0224  HGB 8.4* 8.6*  ALBUMIN 2.3* 2.3*  CALCIUM 9.8 9.5  PHOS 4.8* 3.2  CREATININE 6.11* 3.78*  K 4.9 4.0   No results for input(s): "IRON", "TIBC", "FERRITIN" in the last 168 hours. Inpatient medications:  apixaban  5 mg Oral BID   [START ON 08/16/2023] calcitRIOL  1.5 mcg Oral Q M,W,F   Chlorhexidine Gluconate Cloth  6 each Topical Q0600   darbepoetin (ARANESP) injection - DIALYSIS  100 mcg Subcutaneous Q Fri-1800   feeding supplement (NEPRO CARB STEADY)  1,000 mL Per Tube Q24H   feeding supplement (PROSource TF20)  60 mL Per Tube Daily   fiber supplement (BANATROL TF)  60 mL Per Tube BID   gabapentin  300 mg Oral QHS   insulin aspart  0-5 Units Subcutaneous QHS   insulin aspart  0-9 Units Subcutaneous TID WC   insulin aspart  3 Units Subcutaneous TID WC   insulin glargine-yfgn  15 Units Subcutaneous Daily   metoprolol tartrate  12.5 mg Oral BID   midodrine  10 mg Oral Q M,W,F-HD   multivitamin  1 tablet  Oral QHS   nutrition supplement (JUVEN)  1 packet Oral BID BM   mouth rinse  15 mL Mouth Rinse 4 times per day   QUEtiapine  50 mg Oral QHS   QUEtiapine  50 mg Oral Q0600    vancomycin 750 mg (08/13/23 1742)   acetaminophen (TYLENOL) oral liquid 160 mg/5 mL, albuterol, clonazePAM, Gerhardt's butt cream, haloperidol lactate, heparin sodium (porcine), oxyCODONE **OR** HYDROmorphone (DILAUDID) injection, lip balm, loperamide HCl, mouth rinse

## 2023-08-15 LAB — COMPREHENSIVE METABOLIC PANEL
ALT: 11 U/L (ref 0–44)
AST: 18 U/L (ref 15–41)
Albumin: 2.5 g/dL — ABNORMAL LOW (ref 3.5–5.0)
Alkaline Phosphatase: 178 U/L — ABNORMAL HIGH (ref 38–126)
Anion gap: 12 (ref 5–15)
BUN: 80 mg/dL — ABNORMAL HIGH (ref 6–20)
CO2: 25 mmol/L (ref 22–32)
Calcium: 9.8 mg/dL (ref 8.9–10.3)
Chloride: 89 mmol/L — ABNORMAL LOW (ref 98–111)
Creatinine, Ser: 5.37 mg/dL — ABNORMAL HIGH (ref 0.61–1.24)
GFR, Estimated: 13 mL/min — ABNORMAL LOW (ref >60)
Glucose, Bld: 340 mg/dL — ABNORMAL HIGH (ref 70–99)
Potassium: 4.4 mmol/L (ref 3.5–5.1)
Sodium: 126 mmol/L — ABNORMAL LOW (ref 135–145)
Total Bilirubin: 0.8 mg/dL (ref <1.2)
Total Protein: 8 g/dL (ref 6.5–8.1)

## 2023-08-15 LAB — PHOSPHORUS: Phosphorus: 4.2 mg/dL (ref 2.5–4.6)

## 2023-08-15 LAB — CBC WITH DIFFERENTIAL/PLATELET
Abs Immature Granulocytes: 0.03 10*3/uL (ref 0.00–0.07)
Basophils Absolute: 0.1 10*3/uL (ref 0.0–0.1)
Basophils Relative: 1 %
Eosinophils Absolute: 0.6 10*3/uL — ABNORMAL HIGH (ref 0.0–0.5)
Eosinophils Relative: 5 %
HCT: 32.1 % — ABNORMAL LOW (ref 39.0–52.0)
Hemoglobin: 9.9 g/dL — ABNORMAL LOW (ref 13.0–17.0)
Immature Granulocytes: 0 %
Lymphocytes Relative: 15 %
Lymphs Abs: 1.6 10*3/uL (ref 0.7–4.0)
MCH: 28.9 pg (ref 26.0–34.0)
MCHC: 30.8 g/dL (ref 30.0–36.0)
MCV: 93.9 fL (ref 80.0–100.0)
Monocytes Absolute: 0.6 10*3/uL (ref 0.1–1.0)
Monocytes Relative: 5 %
Neutro Abs: 8.4 10*3/uL — ABNORMAL HIGH (ref 1.7–7.7)
Neutrophils Relative %: 74 %
Platelets: 380 10*3/uL (ref 150–400)
RBC: 3.42 MIL/uL — ABNORMAL LOW (ref 4.22–5.81)
RDW: 17 % — ABNORMAL HIGH (ref 11.5–15.5)
WBC: 11.3 10*3/uL — ABNORMAL HIGH (ref 4.0–10.5)
nRBC: 0 % (ref 0.0–0.2)

## 2023-08-15 LAB — GLUCOSE, CAPILLARY
Glucose-Capillary: 141 mg/dL — ABNORMAL HIGH (ref 70–99)
Glucose-Capillary: 150 mg/dL — ABNORMAL HIGH (ref 70–99)
Glucose-Capillary: 159 mg/dL — ABNORMAL HIGH (ref 70–99)
Glucose-Capillary: 205 mg/dL — ABNORMAL HIGH (ref 70–99)
Glucose-Capillary: 258 mg/dL — ABNORMAL HIGH (ref 70–99)

## 2023-08-15 LAB — MAGNESIUM: Magnesium: 2.8 mg/dL — ABNORMAL HIGH (ref 1.7–2.4)

## 2023-08-15 MED ORDER — HYDROMORPHONE HCL 1 MG/ML IJ SOLN: 0.5000 mg | INTRAMUSCULAR | Status: DC | PRN

## 2023-08-15 MED ORDER — OXYCODONE HCL 5 MG PO TABS
5.0000 mg | ORAL_TABLET | ORAL | Status: DC | PRN
  Administered 2023-08-15 – 2023-08-18 (×6): 5 mg via ORAL
  Filled 2023-08-15 (×6): qty 1

## 2023-08-15 MED ORDER — CHLORHEXIDINE GLUCONATE CLOTH 2 % EX PADS
6.0000 | MEDICATED_PAD | Freq: Every day | CUTANEOUS | Status: DC
  Administered 2023-08-16 – 2023-08-17 (×2): 6 via TOPICAL

## 2023-08-15 MED ORDER — METOPROLOL TARTRATE 25 MG PO TABS
25.0000 mg | ORAL_TABLET | Freq: Two times a day (BID) | ORAL | Status: DC
  Administered 2023-08-15 (×2): 25 mg via ORAL
  Filled 2023-08-15 (×2): qty 1

## 2023-08-15 MED ORDER — QUETIAPINE FUMARATE 50 MG PO TABS: 25.0000 mg | ORAL_TABLET | Freq: Every day | ORAL | Status: DC

## 2023-08-15 MED ORDER — NEPRO/CARBSTEADY PO LIQD
1000.0000 mL | ORAL | Status: DC
  Administered 2023-08-15 – 2023-08-16 (×2): 1000 mL
  Filled 2023-08-15: qty 1000

## 2023-08-15 NOTE — Progress Notes (Signed)
[facility] Progress Note  Subjective: seen in room, no c/o's  Vitals:   08/15/23 0000 08/15/23 0616 08/15/23 0755 08/15/23 1018  BP: 118/89 137/87 106/66 106/73  Pulse: (!) 127 (!) 127 (!) 118 (!) 118  Resp: 13 13 14    Temp:  98 F (36.7 C) 98.3 F (36.8 C)   TempSrc:  Oral Oral   SpO2: 100% 95% 95%   Weight:  75.8 kg    Height:        Exam: Gen: alert, NG tube on  CVS: Regular tachycardia, S1 and S2 Resp: Transmitted breath sounds, no distinct rales or rhonchi, [place] O2 Abd: Soft, flat, nontender, bowel sounds normal Ext: Status post left AKA, no edema right leg. Upper ext edema has resolved Access: Left femoral TDC in place    Renal-related home meds: - sensipar 120 at bedtime - gabapentin 100mg  - renvavite - renvela 3 ac tid    OP HD: MWF [facility] 4h  400/1.5    77kg   2/3 bath  [facility] TDC    Heparin none - last OP HD 10/7, post wt 74.6kg - misses HD once every other week approx - rocaltrol 1.50 mcg    Assessment/ Plan: MRSA bacteremia with mitral valve endocarditis: Suspected to be related to catheter related bacteremia. SP line holiday w/ old TDC removed 10/16 and new TDC placed 10/18 by IR.  Remains on intravenous vancomycin.  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  ESRD: on HD MWF. SP course of CRRT 10/22- 10/30, now back on iHD. Next HD Monday.  Volume: [place] 2L O2 dc'd on 11/10. Remains on RA. Euvolemic on exam, 1kg under dry wt. New dry wt around 75kg perhaps. Cont to lower vol w/ HD as tol Anemia: Hb 7- 9 range. ESA dose increased to 100 mcg q. weekly. 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 Monitor on renal diet.  On calcitriol for PTH control. Nutrition: On dysphagia 1 diet following swallow evaluation and ongoing tube feeds. Hypotension: Weaned off of pressors and the po midodrine HD access: SP line holiday with new L  femoral TDC placed on 10/18. Pt has occlusion of bilateral  subclavian veins limiting his options for dialysis access.     Shane Moselle MD  [facility] 08/15/2023, 10:32 AM  Recent Labs  Lab 08/14/23 0224 08/15/23 0219  HGB 8.6* 9.9*  ALBUMIN 2.3* 2.5*  CALCIUM 9.5 9.8  PHOS 3.2 4.2  CREATININE 3.78* 5.37*  K 4.0 4.4   No results for input(s): "IRON", "TIBC", "FERRITIN" in the last 168 hours. Inpatient medications:  apixaban  5 mg Oral BID   [START ON 08/16/2023] calcitRIOL  1.5 mcg Oral Q M,W,F   Chlorhexidine Gluconate Cloth  6 each Topical Q0600   darbepoetin (ARANESP) injection - DIALYSIS  100 mcg Subcutaneous Q Fri-1800   feeding supplement (PROSource TF20)  60 mL Per Tube Daily   fiber supplement (BANATROL TF)  60 mL Per Tube BID   gabapentin  300 mg Oral QHS   insulin aspart  0-5 Units Subcutaneous QHS   insulin aspart  0-9 Units Subcutaneous TID WC   insulin aspart  3 Units Subcutaneous TID WC   insulin glargine-yfgn  15 Units Subcutaneous Daily   metoprolol tartrate  25 mg Oral BID   midodrine  10 mg Oral Q M,W,F-HD   multivitamin  1 tablet Oral QHS   nutrition supplement (JUVEN)  1 packet Oral BID BM  mouth rinse  15 mL Mouth Rinse 4 times per day   QUEtiapine  25 mg Oral QHS   QUEtiapine  50 mg Oral Q0600    feeding supplement (NEPRO CARB STEADY) 1,000 mL (08/15/23 0358)   vancomycin 750 mg (08/13/23 1742)   acetaminophen (TYLENOL) oral liquid 160 mg/5 mL, albuterol, clonazePAM, Gerhardt's butt cream, haloperidol lactate, heparin sodium (porcine), oxyCODONE **OR** HYDROmorphone (DILAUDID) injection, lip balm, loperamide HCl, mouth rinse

## 2023-08-15 NOTE — Progress Notes (Addendum)
PROGRESS NOTE    [name]  WUJ:[id number] DOB: 11/11/1980 DOA: 07/09/2023 PCP: [name], NP  Chief Complaint  Patient presents with   Altered Mental Status    Hospital Course:  42 yo M w/ pertinent PMH ESRD on HD MWF, IDDM, gastroparesis, left AKA, HTN presents to [facility] on 10/11 w/ AMS. Patient recently admitted to [facility] on 7/12 sepsis from right diabetic foot ulcer. Cultures w/ MRSA. Patient had dialysis catheter removed and replaced. ID treated w/ vanc. Patient refused echo and left on 7/17 against medical advice.  On 10/11 patient was found by family member on ground altered. EMS arrived and dialysis catheter partially removed. Patient more alert for them. Has been complaining of diarrhea x1 week per family. Unsure if patient went to dialysis today. Transferred to [facility] ED. On arrival bp stable 107/70 and afebrile. Patient remains confused and occasionally with agitation. Given dilaudid. CBG 497. Beta H >8. VBG ph 7.06, 16, 54, 4.7. Given iv fluids and started on insulin drip for DKA. CT head negative for acute abnormality. Cxr no significant findings. CT abd/pelvis no acute findings; possible steatosis vs. Liver mass 7 x 4 cm recommending f/u w/ MRI. WBC 20.7 and LA 2.4. Sepsis protocol initiated. Cultures obtained and started on vanc/zosyn. Given ams, dka, and soft bp pccm consulted for icu admission.   10/21 pccm called overnight to assess for worsening hypoxia and AMS after vomiting. On NRB w/ sats 70-80s. Hypoglycemic given dextrose which improved his mental status some. Transferred to 2H icu for intubation.    10/12: altered mental status, DKA 10/12: blood cultures positive for MRSA 10/13 TTE LVEF 25-30% w/ severely depressed LVF, global hypokinesis, gd I diastolic dysfxn, mod RV fxn reduction. Possible filamentous structure on MV 10/16 TEE LVEF 30-35%RV fxn nml. Large mass (2.00 cm x 0.88 cm ) on the posterior mitral leaflet which appeared to be extending in the mitral  annulus. There are two mobile masses visible on the anterior leaflet of the tricupid valve which also appears to have a perforation. Another mobile  mass ( 0.68 cm x 0.67 cm) on the posterior tricupid leaflet. Moderate Tricuspid regurgitation which appears to  be through the leaflet perforation  10/16 seen by Cardiothoracic surg. Felt poor candidate. Recommended 6 weeks abx and recommended repeat TEE 1 week 10/17 noting left arm weakness. Not clear when started.  MRI showed Right MCA infarct w/ acute infarcts in left occipital and bilateral cerebellum felt c/w septic emboli. Neuro consulted.  10/18 tunneled left femoral vasc cath placed after catheter holiday  10/20 palliative care consulted later that day worsening hypoxia. Sats 70s. After vomiting. More lethargic  10/21: resp failure requiring intubation shortly after midnight  10/22- TEE 10/22 with EF 35%, LV global hypokinesis. Very large vegetation lateral scallop of posterior mitral leaflet 3.9cm length 1 cm width, highly mobile, prolapsing into LV. No large vegetation seen on TV.  Later that afternoon and abrupt hemodynamic changes requiring escalation of vasopressor support.  Felt given the size of his vegetation and increased bulk of the vegetation infections unlikely to clear.  He was felt to be decompensation may have due to of breath leaflet perforation and acute mitral valve regurgb 10/23: per nursing less responsive than yesterday, increased ETT secretions. Clotting off CRRT. Ct head showing expected evolution of the right MCA stroke.  Cardiothoracic surgery re-consulted for worsening MVE. Felt too high risk here. Recommended referral to tertiary care  10/24 agitated when sedation decreased, is purposeful but does  not follow commands. Tolerating UF gaols of -150. Still on NE infusion but extubated later that afternoon after passing SBT 10/25 failed swallow eval still on just 3 lpm O2. Encephalopathic. Added seroquel in effort to get him off  the precedex. Cardiology told no beds at [facility]. Not clear if accepted or not. Worsening pressor requirements. Got blood for hgb 6. Cefepime resumed for clinical decline and leukocytosis  10/26 Hemoglobin again dropped to 6.3 overnight with increased pressor requirement yesterday afternoon now on vaso and levo. 10/27 Hypoglycemic episode overnight, mental status improved a little over weekend  10/28 Getting another unit of blood more awake.  Oriented x 2.  Still on Precedex.  Having liquid stools. 10/30 Off Precedex, low-dose levo.  Plan to transition to IHD 10/29 Cefepime for aspiration course completed.  10/31 Seen by overnight team for hypoxia and somnolence ABG with mild hypercapnia. Again declined by [facility] because they were at capacity. More awake. Doing better w/ Bed side swallow but still likely aspirating. CRRT stopped.  11/1 Adjustments made for better glycemic control.  Much more awake but a little confused 11/4 Remains on low dose levo 11/5; weaned off pressors and transferred to progressive care unit Patient was extubated, transferred to progressive care unit under [facility] service on 08/04/2023  11/7-called [facility], at capacity.  Declined transfer 11/12-femoral line removed; palliative care consulted for goals of care 11/12-called [facility] for transfer, declined due to capacity 11/13 no changes. Will pursue LTACH options 11/14 no changes. Calmer today. Pt's mother, [name], would like to cont full aggressive care.  11/15 discussed possibility of PEG tube with patient's mother at this time she like to continue to increase oral feeds. 11/16: Patient too somnolent today to tolerate significant increase in p.o.  Subjective: No acute events overnight.  Patient remains tachycardic this morning.  He is sleepy but arousable.  Bedside RN reports he received pain medication overnight.  No family at bedside  Objective: Vitals:   08/15/23 0616 08/15/23 0755 08/15/23 1018 08/15/23 1109  BP: 137/87 106/66  106/73 111/67  Pulse: (!) 127 (!) 118 (!) 118 (!) 115  Resp: 13 14  14   Temp: 98 F (36.7 C) 98.3 F (36.8 C)  97.7 F (36.5 C)  TempSrc: Oral Oral  Oral  SpO2: 95% 95%  96%  Weight: 75.8 kg     Height:        Intake/Output Summary (Last 24 hours) at 08/15/2023 1310 Last data filed at 08/14/2023 1400 Gross per 24 hour  Intake 200 ml  Output --  Net 200 ml   Filed Weights   08/13/23 0548 08/14/23 0300 08/15/23 0616  Weight: 76.2 kg 75.8 kg 75.8 kg    Examination:  General exam: Appears calm and comfortable  Respiratory system: Clear to auscultation. Respiratory effort normal. Cardiovascular system: S1 & S2 heard, RRR.  Gastrointestinal system: Abdomen is nondistended, soft. TTP in epigastrium. No organomegaly or masses felt.  Central nervous system: Alert and intermittently aggressive Extremities: s/p L AKA healed appropriately Skin: tattoos Psychiatry: calm with intermittent verbal aggression.  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)   Hemiparesis affecting left side as late effect of stroke ([name])   Dysphagia due to recent stroke   Physical deconditioning   Cavitary pneumonia   Subclavian vein occlusion, bilateral ([name])   Acute blood  loss anemia   Fatty liver   Liver mass   Insulin dependent type 1 diabetes mellitus ([name])   Diabetic gastroparesis ([name])   Septic shock ([name])   Anemia of chronic disease   Iron deficiency anemia, unspecified   Secondary hyperparathyroidism of renal origin ([name])   Thrombocytopenia ([name])   Acute metabolic encephalopathy   MRSA bacteremia   Right elbow pain   ESRD on hemodialysis ([name])   Acute systolic CHF (congestive heart failure) ([name])   DCM (dilated cardiomyopathy) ([name])   Coronary artery calcification seen on CAT scan   Infective endocarditis  of tricuspid valve   Protein-calorie malnutrition, severe   Hx of AKA (above knee amputation), left ([name])   Cerebrovascular accident (CVA) due to embolism of cerebral artery ([name])    Evolving cavitary PNA and mucous plugging Suspect cavitation is embolic from tricuspid valve endocarditis. - Supplemental O2 support for O2 sat > 90%, on RA at present - Pulmonary hygiene (IS/flutter, mobilization as able) - Vancomycin per ID recs - Obtain CT 4-6 weeks   Negative HIV status -Patient spit in the eye of one of the nurses last week, health at work started her on antiretroviral therapy for HIV - All labs in the epic are negative for HIV -HIV antibody from July 2024 was negative -HIV RNA was ordered which was negative; no clear evidence that patient has HIV   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  -shock resolved - Off of vasopressors, consider droxidopa if requiring resumption - Continue midodrine with dialysis only.   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 - Low dose metoprolol resumed 11/13, have attempted to increase metoprolol given his persistent tachycardia but blood pressure remains low to normotensive and does not seem to tolerate increased Bblockers. - Definitive management is valve repair/replacement - Cardiology/TCTS consulted, recommend transfer to tertiary care center due to high-risk patient/procedure - [facility] has denied transfer x 2 (capacity), [facility] denied for multiple prohibitive surgical risk [facility] for transfer on 08/05/2023, declined for capacity. Again called [facility] on 08/10/2023; declined due to capacity)   Ongoing sinus tachycardia This is likely multifactorial  given widespread infection, VTE, endocarditis, as well as septic emboli in the lungs. HR improved some after reinitiating metop on 11/13.  But still 110s, BP unlikely to tolerate higher dose.  Decreased Dilaudid and Seroquel which may help BP some.  Will continue to increase metoprolol as blood pressure tolerates  Cont Cardiac monitoring. Remains very high risk of decompensation. - Optimize electrolytes for K > 4, Mg > 2 Discontinued midodrine 11/13.    LUE DVT Dopplers 11/4 positive for L IJ, L [place], L axillary veins; septic versus VTE. - s/p Heparin gtt --Now on apixaban    Acute metabolic encephalopathy w/ agitated delirium  Acute Right MCA stroke/septic infarct w/ left sided hemiparesis   MRA Brain 10/17 ?R M2 occlusion, found to have R MCA territory infarct, favored to be septic emboli. -Intermittent periods of delirium and agitation (including physically and verbally aggressive with staff).  Continue restraints. -Increasing somnolence over the past few days, have decreased Dilaudid dosing, decreasing day time Seroquel today.  Continue to monitor closely. - Delirium precautions - Klonopin PRN - PT/OT/SLP as able to participate in care   End-stage renal disease on  hemodialysis (MWF at baseline) Has occluded bilateral [place] veins  Mild hyperkalemia off CRRT - Nephrology following, appreciate assistance - Successfully transitioned to [facility], last HD 11/13 SP line holiday w/ old TDC removed 10/16 and new TDC placed 10/18 by IR.  - Trend BMP - Replete electrolytes as indicated - Monitor I&Os - Avoid nephrotoxic agents as able - Ensure adequate renal perfusion   Dysphagia - TF via Cortrak -Speech therapy working with pt -Started on dysphagia 1 diet with nectar thick liquid but continues to have difficulty sustaining adequate p.o. nutrition.  At current patient is too somnolent to continue to try p.o. intake. Requires significant prompting to complete meal - Cont diet with SLP for now.  Tube feeds to supplement.  -Extensive discussion with the patient's mom on 11/15, 11/16 regarding needing permanent source of nutrition for the patient.  At this time she is refusing PEG tube.  She would like to continue to try to increase his p.o. intake by enticing him with his favorite foods.  Unfortunately many of these are high risk of choking/aspiration.  Will continue to try to modify them as able given his dysphagia diet. -**Addendum: As of the evening 11/17 patient has eaten 50% of his lunch and 90% of his dinner.  After further discussion with his mom she would like to hold off on PEG tube.  We have further decreased his oxycodone/Dilaudid and have discontinued his morning dose Seroquel to promote alertness.  Speech therapy will reevaluate tomorrow morning and hopefully repeat bedside swallow. If successful, will plan to remove NGT and cont with PO feeds.   T2DM w/ hyperglycemia and hypoglycemia, brittle Basal/bolus with SSI, titrate as needed. -Patient is high risk of hypoglycemic events. -Continue CBG to 4 hours    Anemia and thrombocytopenia critical illness S/p 5 units total PRBC. -Hemoglobin is currently stable, cont to follow   Incidental finding of Liver steatosis vs. liver mass CT A/P showing 7 x 4 cm density in anterior liver. - Outpatient surveillance/GI follow up   Hyponatremia On HD, expect further resolution  Goals of care Overall poor prognosis.  Unfortunately patient remains high risk of decompensation at any time.  Had extensive discussion with the patient's mother on 11/16 and 11/15 regarding his prognosis.  Explained the details of his multiorgan failure (ESRD, endocarditis, CHF, septic emboli in lungs, CVA, ongoing encephalopathy, dysphagia etc) and have consulted palliative care.  At this time patient's mom, [name] wishes to proceed with full aggressive treatment.  Unfortunately patient does not have a consistent source of nutrition. Requires significant prompting to  eat. After weekend visits with mom, Ms. Alexis Goodell, pt is more alert and eating better. Will hold off on PEG for now. Repeat eval with SLP tomorrow. Cont to encourage PO.  We did apply to LTAC, however patient was declined given his lack of consistent nutrition.  LTAC does report if patient has PEG tube placed we can reapply.  This was also communicated to the [facility] team who is aware.  Patient has been denied transfer to [facility] x 2 as well as [facility].     DVT prophylaxis: Eliquis Code Status: Full Family Communication: as above. Disposition:   Status is: Inpatient. Family still desiring transfer. See above Consultants:  Palliative care Nephrology ID   Antimicrobials:  Anti-infectives (From admission, onward)    Start     Dose/Rate Route Frequency Ordered Stop   08/02/23 1200  vancomycin (VANCOREADY) IVPB 750 mg/150 mL  750 mg 150 mL/hr over 60 Minutes Intravenous Every M-W-F (Hemodialysis) 07/31/23 1404 09/19/23 2359   07/30/23 1800  vancomycin (VANCOREADY) IVPB 750 mg/150 mL        750 mg 150 mL/hr over 60 Minutes Intravenous Every M-W-F (Hemodialysis) 07/30/23 1358 07/30/23 1923   07/29/23 0812  vancomycin variable dose per unstable renal function (pharmacist dosing)  Status:  Discontinued         Does not apply See admin instructions 07/29/23 0813 07/31/23 1404   07/28/23 1000  vancomycin (VANCOREADY) IVPB 750 mg/150 mL  Status:  Discontinued        750 mg 150 mL/hr over 60 Minutes Intravenous Every 24 hours 07/27/23 1539 07/29/23 0813   07/27/23 1630  vancomycin (VANCOREADY) IVPB 500 mg/100 mL        500 mg 100 mL/hr over 60 Minutes Intravenous  Once 07/27/23 1539 07/27/23 1759   07/23/23 1945  ceFEPIme (MAXIPIME) 2 g in sodium chloride 0.9 % 100 mL IVPB  Status:  Discontinued        2 g 200 mL/hr over 30 Minutes Intravenous Every 12 hours 07/23/23 1856 07/27/23 1053   07/21/23 1200  vancomycin (VANCOREADY) IVPB 750 mg/150 mL  Status:  Discontinued         750 mg 150 mL/hr over 60 Minutes Intravenous Every M-W-F (Hemodialysis) 07/19/23 1106 07/20/23 0842   07/21/23 0230  vancomycin (VANCOREADY) IVPB 750 mg/150 mL  Status:  Discontinued        750 mg 150 mL/hr over 60 Minutes Intravenous Every 24 hours 07/20/23 1451 07/27/23 1314   07/20/23 2200  ceFEPIme (MAXIPIME) 2 g in sodium chloride 0.9 % 100 mL IVPB  Status:  Discontinued        2 g 200 mL/hr over 30 Minutes Intravenous Every 12 hours 07/20/23 1451 07/21/23 1108   07/19/23 2200  ceFEPIme (MAXIPIME) 1 g in sodium chloride 0.9 % 100 mL IVPB  Status:  Discontinued        1 g 200 mL/hr over 30 Minutes Intravenous Every 24 hours 07/19/23 1409 07/20/23 1451   07/19/23 2000  ceFEPIme (MAXIPIME) 2 g in sodium chloride 0.9 % 100 mL IVPB  Status:  Discontinued        2 g 200 mL/hr over 30 Minutes Intravenous Every M-W-F 07/19/23 0110 07/19/23 1409   07/19/23 0200  ceFEPIme (MAXIPIME) 2 g in sodium chloride 0.9 % 100 mL IVPB        2 g 200 mL/hr over 30 Minutes Intravenous  Once 07/19/23 0110 07/19/23 0221   07/18/23 1015  vancomycin (VANCOREADY) IVPB 750 mg/150 mL        750 mg 150 mL/hr over 60 Minutes Intravenous  Once 07/18/23 0929 07/19/23 1137   07/16/23 0000  ceFAZolin (ANCEF) IVPB 2g/100 mL premix        2 g 200 mL/hr over 30 Minutes Intravenous To Radiology 07/15/23 1408 07/16/23 1250   07/14/23 1200  vancomycin (VANCOREADY) IVPB 750 mg/150 mL  Status:  Discontinued        750 mg 150 mL/hr over 60 Minutes Intravenous Every M-W-F (Hemodialysis) 07/12/23 1203 07/19/23 1106   07/11/23 2330  vancomycin (VANCOREADY) IVPB 750 mg/150 mL        750 mg 150 mL/hr over 60 Minutes Intravenous  Once 07/11/23 2030 07/12/23 0110   07/11/23 1800  vancomycin (VANCOREADY) IVPB 750 mg/150 mL  Status:  Discontinued        750 mg 150 mL/hr over 60 Minutes Intravenous  Once 07/11/23 0856 07/11/23 1810   07/10/23 2318  vancomycin variable dose per unstable renal function (pharmacist dosing)  Status:   Discontinued         Does not apply See admin instructions 07/10/23 2319 07/12/23 1203   07/10/23 1429  vancomycin (VANCOREADY) IVPB 750 mg/150 mL  Status:  Discontinued        750 mg 150 mL/hr over 60 Minutes Intravenous Every Dialysis 07/10/23 1429 07/11/23 2030   07/10/23 1230  fidaxomicin (DIFICID) tablet 200 mg  Status:  Discontinued        200 mg Oral 2 times daily 07/10/23 1139 07/10/23 1207   07/10/23 1000  piperacillin-tazobactam (ZOSYN) IVPB 2.25 g  Status:  Discontinued        2.25 g 100 mL/hr over 30 Minutes Intravenous Every 8 hours 07/10/23 0908 07/10/23 1350   07/09/23 2330  vancomycin (VANCOREADY) IVPB 1750 mg/350 mL        1,750 mg 175 mL/hr over 120 Minutes Intravenous  Once 07/09/23 2329 07/10/23 0734   07/09/23 2330  piperacillin-tazobactam (ZOSYN) IVPB 3.375 g        3.375 g 100 mL/hr over 30 Minutes Intravenous  Once 07/09/23 2329 07/10/23 0240       Data Reviewed: I have personally reviewed following labs and imaging studies  CBC: Recent Labs  Lab 08/09/23 0651 08/11/23 0906 08/13/23 0800 08/14/23 0224 08/15/23 0219  WBC 12.6* 12.2* 10.8* 10.1 11.3*  NEUTROABS  --  9.7* 7.8* 7.8* 8.4*  HGB 7.1* 7.5* 8.4* 8.6* 9.9*  HCT 23.1* 24.6* 27.6* 28.1* 32.1*  MCV 95.9 96.1 95.8 93.7 93.9  PLT 265 294 323 355 380    Basic Metabolic Panel: Recent Labs  Lab 08/09/23 1130 08/10/23 0500 08/11/23 0223 08/11/23 0906 08/12/23 0236 08/13/23 0800 08/14/23 0224 08/15/23 0219  NA 133*  --   --  128*  --  128* 131* 126*  K 4.4  --   --  4.2  --  4.9 4.0 4.4  CL 98  --   --  90*  --  92* 92* 89*  CO2 22  --   --  27  --  23 27 25   GLUCOSE 209*  --   --  191*  --  316* 250* 340*  BUN 86*  --   --  65*  --  80* 47* 80*  CREATININE 6.46*  --   --  6.04*  --  6.11* 3.78* 5.37*  CALCIUM 9.0  --   --  9.4  --  9.8 9.5 9.8  MG  --    < > 3.0*  --  2.8* 3.2* 2.5* 2.8*  PHOS 4.7*  --   --   --   --  4.8* 3.2 4.2   < > = values in this interval not displayed.     GFR: Estimated Creatinine Clearance: 16.6 mL/min (A) (by C-G formula based on SCr of 5.37 mg/dL (H)).  Liver Function Tests: Recent Labs  Lab 08/09/23 1130 08/11/23 0906 08/13/23 0800 08/14/23 0224 08/15/23 0219  AST  --  21 21 18 18   ALT  --  14 11 11 11   ALKPHOS  --  173* 154* 143* 178*  BILITOT  --  0.7 0.6 0.8 0.8  PROT  --  6.8 7.4 7.7 8.0  ALBUMIN 2.0* 2.0* 2.3* 2.3* 2.5*    CBG: Recent Labs  Lab 08/14/23 1642 08/14/23 1942 08/14/23 2326 08/15/23 0604 08/15/23 1108  GLUCAP 96 225* 318* 258*  205*     No results found for this or any previous visit (from the past 240 hour(s)).       Radiology Studies: No results found.      Scheduled Meds:  apixaban  5 mg Oral BID   [START ON 08/16/2023] calcitRIOL  1.5 mcg Oral Q M,W,F   Chlorhexidine Gluconate Cloth  6 each Topical Q0600   Chlorhexidine Gluconate Cloth  6 each Topical Q0600   darbepoetin (ARANESP) injection - DIALYSIS  100 mcg Subcutaneous Q Fri-1800   feeding supplement (PROSource TF20)  60 mL Per Tube Daily   fiber supplement (BANATROL TF)  60 mL Per Tube BID   gabapentin  300 mg Oral QHS   insulin aspart  0-5 Units Subcutaneous QHS   insulin aspart  0-9 Units Subcutaneous TID WC   insulin aspart  3 Units Subcutaneous TID WC   insulin glargine-yfgn  15 Units Subcutaneous Daily   metoprolol tartrate  25 mg Oral BID   midodrine  10 mg Oral Q M,W,F-HD   multivitamin  1 tablet Oral QHS   nutrition supplement (JUVEN)  1 packet Oral BID BM   mouth rinse  15 mL Mouth Rinse 4 times per day   QUEtiapine  25 mg Oral QHS   QUEtiapine  50 mg Oral Q0600   Continuous Infusions:  feeding supplement (NEPRO CARB STEADY) 1,000 mL (08/15/23 0358)   vancomycin 750 mg (08/13/23 1742)     LOS: 36 days    Time spent:     Debarah Crape, DO [facility]   To contact the attending provider between 7A-7P or the covering provider during after hours 7P-7A, please log into the web site  [email or web address] and access using universal [place] password for that web site. If you do not have the password, please call the hospital operator.  08/15/2023, 1:10 PM

## 2023-08-16 ENCOUNTER — Telehealth: Payer: Self-pay | Admitting: Cardiovascular Disease

## 2023-08-16 DIAGNOSIS — E1010 Type 1 diabetes mellitus with ketoacidosis without coma: Secondary | ICD-10-CM | POA: Diagnosis not present

## 2023-08-16 DIAGNOSIS — R570 Cardiogenic shock: Secondary | ICD-10-CM | POA: Diagnosis not present

## 2023-08-16 DIAGNOSIS — R4182 Altered mental status, unspecified: Secondary | ICD-10-CM | POA: Diagnosis not present

## 2023-08-16 DIAGNOSIS — I059 Rheumatic mitral valve disease, unspecified: Secondary | ICD-10-CM | POA: Diagnosis not present

## 2023-08-16 LAB — COMPREHENSIVE METABOLIC PANEL
ALT: 11 U/L (ref 0–44)
AST: 14 U/L — ABNORMAL LOW (ref 15–41)
Albumin: 2.3 g/dL — ABNORMAL LOW (ref 3.5–5.0)
Alkaline Phosphatase: 136 U/L — ABNORMAL HIGH (ref 38–126)
Anion gap: 12 (ref 5–15)
BUN: 131 mg/dL — ABNORMAL HIGH (ref 6–20)
CO2: 26 mmol/L (ref 22–32)
Calcium: 9.7 mg/dL (ref 8.9–10.3)
Chloride: 89 mmol/L — ABNORMAL LOW (ref 98–111)
Creatinine, Ser: 7.08 mg/dL — ABNORMAL HIGH (ref 0.61–1.24)
GFR, Estimated: 9 mL/min — ABNORMAL LOW (ref >60)
Glucose, Bld: 89 mg/dL (ref 70–99)
Potassium: 4.7 mmol/L (ref 3.5–5.1)
Sodium: 127 mmol/L — ABNORMAL LOW (ref 135–145)
Total Bilirubin: 0.8 mg/dL (ref <1.2)
Total Protein: 7.1 g/dL (ref 6.5–8.1)

## 2023-08-16 LAB — GLUCOSE, CAPILLARY
Glucose-Capillary: 177 mg/dL — ABNORMAL HIGH (ref 70–99)
Glucose-Capillary: 261 mg/dL — ABNORMAL HIGH (ref 70–99)
Glucose-Capillary: 255 mg/dL — ABNORMAL HIGH (ref 70–99)
Glucose-Capillary: 62 mg/dL — ABNORMAL LOW (ref 70–99)
Glucose-Capillary: 62 mg/dL — ABNORMAL LOW (ref 70–99)
Glucose-Capillary: 85 mg/dL (ref 70–99)

## 2023-08-16 LAB — CBC WITH DIFFERENTIAL/PLATELET
Abs Immature Granulocytes: 0.05 10*3/uL (ref 0.00–0.07)
Basophils Absolute: 0.1 10*3/uL (ref 0.0–0.1)
Basophils Relative: 1 %
Eosinophils Absolute: 0.6 10*3/uL — ABNORMAL HIGH (ref 0.0–0.5)
Eosinophils Relative: 6 %
HCT: 27.7 % — ABNORMAL LOW (ref 39.0–52.0)
Hemoglobin: 8.7 g/dL — ABNORMAL LOW (ref 13.0–17.0)
Immature Granulocytes: 1 %
Lymphocytes Relative: 14 %
Lymphs Abs: 1.4 10*3/uL (ref 0.7–4.0)
MCH: 29.5 pg (ref 26.0–34.0)
MCHC: 31.4 g/dL (ref 30.0–36.0)
MCV: 93.9 fL (ref 80.0–100.0)
Monocytes Absolute: 0.6 10*3/uL (ref 0.1–1.0)
Monocytes Relative: 6 %
Neutro Abs: 7.2 10*3/uL (ref 1.7–7.7)
Neutrophils Relative %: 72 %
Platelets: 345 10*3/uL (ref 150–400)
RBC: 2.95 MIL/uL — ABNORMAL LOW (ref 4.22–5.81)
RDW: 17.1 % — ABNORMAL HIGH (ref 11.5–15.5)
WBC: 10 10*3/uL (ref 4.0–10.5)
nRBC: 0 % (ref 0.0–0.2)

## 2023-08-16 LAB — PHOSPHORUS: Phosphorus: 5.2 mg/dL — ABNORMAL HIGH (ref 2.5–4.6)

## 2023-08-16 LAB — MAGNESIUM: Magnesium: 3.2 mg/dL — ABNORMAL HIGH (ref 1.7–2.4)

## 2023-08-16 LAB — HEPATITIS B SURFACE ANTIGEN: Hepatitis B Surface Ag: NONREACTIVE

## 2023-08-16 MED ORDER — DEXTROSE 50 % IV SOLN: 12.5000 g | INTRAVENOUS | Status: AC

## 2023-08-16 MED ORDER — GLUCOSE 40 % PO GEL
ORAL | Status: AC
  Administered 2023-08-16: 31 g via ORAL
  Filled 2023-08-16: qty 1.21

## 2023-08-16 MED ORDER — DEXTROSE 50 % IV SOLN
INTRAVENOUS | Status: AC
  Administered 2023-08-16: 12.5 g via INTRAVENOUS
  Filled 2023-08-16: qty 50

## 2023-08-16 MED ORDER — JUVEN PO PACK
1.0000 | PACK | Freq: Two times a day (BID) | ORAL | Status: DC
  Administered 2023-08-16 – 2023-08-27 (×17): 1
  Filled 2023-08-16 (×18): qty 1

## 2023-08-16 MED ORDER — GLUCOSE 40 % PO GEL: 1.0000 | ORAL | Status: AC

## 2023-08-16 MED ORDER — METOPROLOL TARTRATE 50 MG PO TABS
50.0000 mg | ORAL_TABLET | Freq: Two times a day (BID) | ORAL | Status: DC
Start: 2023-08-16 — End: 2023-08-18
  Administered 2023-08-16 – 2023-08-18 (×4): 50 mg via ORAL
  Filled 2023-08-16 (×5): qty 1

## 2023-08-16 NOTE — Progress Notes (Signed)
Physical Therapy Treatment Patient Details Name: [name] MRN: [id number] DOB: 1981/03/03 Today's Date: 08/16/2023   History of Present Illness Patient is a 42 y/o male admitted 07/09/23 with confusion found to be in septic shock found to have MRSA in blood cultures, in DKA, TEE positive for vegitation on multiple valves, MRI showing R MCA infarct.  Pt with resp failure on 10/21 resulting in intubation and transfer to [facility]; extubated 10/24.  CRRT started 10/22-stopped 10/30.  PMH positive for ESRD on HD, IDDM, gastroparesis, L AKA, HTN. (Simultaneous filing. User may not have seen previous data.)    PT Comments  Pt alert, pleasant, and gave great effort this therapy session. Pt demos L UE flaccidity/swelling, L neglect, impaired sequencing, delayed processing, poor attention span, impaired balance, and requires maxA for transfer to EOB and min-maxA to maintain EOB sitting balance. Focused on sitting balance and tending to L UE and overall field. Acute PT to cont to follow.    If plan is discharge home, recommend the following: Two people to help with walking and/or transfers;Assist for transportation;Supervision due to cognitive status;Help with stairs or ramp for entrance   Can travel by private vehicle     No  Equipment Recommendations  Hoyer lift;Wheelchair (measurements PT);Wheelchair cushion (measurements PT)    Recommendations for Other Services       Precautions / Restrictions Precautions Precautions: Fall (Simultaneous filing. User may not have seen previous data.) Precaution Comments: LAKA, L hemiparesis, R wrist and ankle restraint, flexiseal, coretrak, blood clot LUE  Restrictions Weight Bearing Restrictions: Yes (Simultaneous filing. User may not have seen previous data.) LLE Weight Bearing: Non weight bearing    Mobility  Bed Mobility Overal bed mobility: Needs Assistance Bed Mobility: Sit to Supine, Rolling, Sidelying to Sit Rolling: Max assist Sidelying to sit:  Max assist, +2 for physical assistance   Sit to supine: Mod assist, +2 for physical assistance   General bed mobility comments: pt unable to use L UE functionally due to flacidity, pt requiring step by step constant multimodal directional cues, max for trunk elevation from [facility] flat    Transfers                   General transfer comment: deferred to focus on sitting balance    Ambulation/Gait               General Gait Details: unable   Stairs             Wheelchair Mobility     Tilt Bed    Modified Rankin (Stroke Patients Only) Modified Rankin (Stroke Patients Only) Pre-Morbid Rankin Score: Moderate disability Modified Rankin: Severe disability     Balance Overall balance assessment: Needs assistance Sitting-balance support: Single extremity supported, Bilateral upper extremity supported, Feet supported Sitting balance-[name]: Poor Sitting balance - Comments: Sat EOB x 25 minutes with CGA to Max A Pt pushing heavily with RUE intermittently but able to correct when asked. Worked on pulling self to bed footboard and pushing/pulling with R UE with PT in front of patient. Pt ultimately required PT to sit next to pt on the R side to maintain static balance, focused on keeping his R shld touching PTs left shoulder, unable to maintain > 5 seconds. worked with OT on weight shifting onto L UE with OT supporting UE and PT assisting with weight shift. when working on dynamic reaching with R UE, L visual field cut noted Postural control: Left lateral lean  Standing balance comment: NT                            Cognition Arousal: Alert (Simultaneous filing. User may not have seen previous data.) Behavior During Therapy: Flat affect (Simultaneous filing. User may not have seen previous data.) Overall Cognitive Status: Impaired/Different from baseline Area of Impairment: Orientation, Attention, Memory, Following commands, Safety/judgement,  Awareness, Problem solving               Orientation Level: Disoriented to, Time, Situation (Current Attention Level: Focused Memory: Decreased short-term memory Following Commands: Follows one step commands with increased time, Follows one step commands inconsistently  Safety/Judgement: Decreased awareness of safety, Decreased awareness of deficits Awareness: Intellectual Problem Solving: Slow processing, Decreased initiation, Difficulty sequencing, Requires verbal cues, Requires tactile cues  General Comments: pt admitting to confusion today. Pt difficulty following directional commands requiring step by step verbal cues with visual stimuli ie. push towards me, push towards your aunt. Pt able to perform simple commands consistently ie. thumbs up, show me 2 fingers, show me the okay sign      Exercises      General Comments General comments (skin integrity, edema, etc.): VSS      Pertinent Vitals/Pain Pain Assessment Pain Assessment: No/denies pain    Home Living                          Prior Function            PT Goals (current goals can now be found in the care plan section) Acute Rehab PT Goals Patient Stated Goal: none stated PT Goal Formulation: Patient unable to participate in goal setting Time For Goal Achievement: 08/25/23 Potential to Achieve Goals: Fair Progress towards PT goals: Progressing toward goals    Frequency    Min 1X/week      PT Plan      Co-evaluation PT/OT/SLP Co-Evaluation/Treatment: Yes Reason for Co-Treatment: Necessary to address cognition/behavior during functional activity;For patient/therapist safety;To address functional/ADL transfers (Simultaneous filing. User may not have seen previous data.) PT goals addressed during session: Mobility/safety with mobility;Balance (Simultaneous filing. User may not have seen previous data.) OT goals addressed during session: Strengthening/ROM      AM-PAC PT "6 Clicks" Mobility    Outcome Measure  Help needed turning from your back to your side while in a flat bed without using bedrails?: A Lot Help needed moving from lying on your back to sitting on the side of a flat bed without using bedrails?: A Lot Help needed moving to and from a bed to a chair (including a wheelchair)?: Total Help needed standing up from a chair using your arms (e.g., wheelchair or bedside chair)?: Total Help needed to walk in hospital room?: Total Help needed climbing 3-5 steps with a railing? : Total 6 Click Score: 8    End of Session Equipment Utilized During Treatment: Gait belt Activity Tolerance: Patient tolerated treatment well Patient left: in bed;with call bell/phone within reach;with bed alarm set;with family/visitor present Nurse Communication: Mobility status PT Visit Diagnosis: Other abnormalities of gait and mobility (R26.89);Other symptoms and signs involving the nervous system (R29.898);Hemiplegia and hemiparesis Hemiplegia - Right/Left: Left Hemiplegia - dominant/non-dominant: Non-dominant Hemiplegia - caused by: Cerebral infarction     Time: 1207-1250 PT Time Calculation (min) (ACUTE ONLY): 43 min  Charges:    $Therapeutic Activity: 8-22 mins $Neuromuscular Re-education: 8-22 mins PT General Charges $$  ACUTE PT VISIT: 1 Visit                     Lewis Shock, PT, DPT Acute Rehabilitation Services Secure chat preferred Office #: [phone]    Iona Hansen 08/16/2023, 2:17 PM

## 2023-08-16 NOTE — Progress Notes (Signed)
[facility] Progress Note  Subjective: seen in room, no c/o's  Vitals:   08/15/23 1940 08/15/23 2339 08/16/23 0400 08/16/23 0740  BP: 115/86 123/81 133/86 131/84  Pulse: (!) 124 (!) 119 (!) 122 (!) 122  Resp: 20 16 18  (!) 24  Temp: 97.6 F (36.4 C) 98.3 F (36.8 C)  98.4 F (36.9 C)  TempSrc: Oral Oral  Oral  SpO2: 93% 98% 98% (!) 88%  Weight:   76.7 kg   Height:        Exam: Gen: alert, NG tube on  CVS: Regular tachycardia, S1 and S2 Resp: Transmitted breath sounds, no distinct rales or rhonchi, [place] O2 Abd: Soft, flat, nontender, bowel sounds normal Ext: Status post left AKA, no edema right leg. Upper ext edema has resolved Access: Left femoral TDC in place    Renal-related home meds: - sensipar 120 at bedtime - gabapentin 100mg  - renvavite - renvela 3 ac tid    OP HD: MWF [facility] 4h  400/1.5    77kg   2/3 bath  [facility] TDC    Heparin none - last OP HD 10/7, post wt 74.6kg - misses HD once every other week approx - rocaltrol 1.50 mcg    Assessment/ Plan: MRSA bacteremia with mitral valve endocarditis: Suspected to be related to catheter related bacteremia. SP line holiday w/ old TDC removed 10/16 and new TDC placed 10/18 by IR.  Remains on intravenous vancomycin.  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  ESRD: on HD MWF. SP CRRT 10/22- 10/30, back on iHD now. HD today.  Volume: [place] 2L O2 dc'd on 11/10. Remains on RA. Euvolemic on exam, 1kg under dry wt. New dry wt around 75kg perhaps. Cont to lower vol w/ HD as tol Anemia: Hb 7- 9 range. ESA dose increased to 100 mcg q. weekly. 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 Monitor on renal diet.  On calcitriol for PTH control. Nutrition: po intake improving, he is eating more and they may be removing his NG tube.  Hypotension: Weaned off of pressors and the po midodrine HD access: SP line holiday with new L  femoral TDC placed on 10/18. Pt has  occlusion of bilateral subclavian veins limiting his options for dialysis access. Dispo: will need to do HD in a chair, have notified the HD unit to try and get him in the chair for HD today.      Shane Moselle MD  [facility] 08/16/2023, 12:13 PM  Recent Labs  Lab 08/14/23 0224 08/15/23 0219  HGB 8.6* 9.9*  ALBUMIN 2.3* 2.5*  CALCIUM 9.5 9.8  PHOS 3.2 4.2  CREATININE 3.78* 5.37*  K 4.0 4.4   No results for input(s): "IRON", "TIBC", "FERRITIN" in the last 168 hours. Inpatient medications:  apixaban  5 mg Oral BID   calcitRIOL  1.5 mcg Oral Q M,W,F   Chlorhexidine Gluconate Cloth  6 each Topical Q0600   Chlorhexidine Gluconate Cloth  6 each Topical Q0600   darbepoetin (ARANESP) injection - DIALYSIS  100 mcg Subcutaneous Q Fri-1800   feeding supplement (PROSource TF20)  60 mL Per Tube Daily   fiber supplement (BANATROL TF)  60 mL Per Tube BID   gabapentin  300 mg Oral QHS   insulin aspart  0-5 Units Subcutaneous QHS   insulin aspart  0-9 Units Subcutaneous TID WC   insulin aspart  3 Units Subcutaneous TID WC   insulin glargine-yfgn  15 Units Subcutaneous  Daily   metoprolol tartrate  50 mg Oral BID   midodrine  10 mg Oral Q M,W,F-HD   multivitamin  1 tablet Oral QHS   nutrition supplement (JUVEN)  1 packet Per Tube BID BM   mouth rinse  15 mL Mouth Rinse 4 times per day   QUEtiapine  50 mg Oral Q0600    vancomycin 750 mg (08/13/23 1742)   acetaminophen (TYLENOL) oral liquid 160 mg/5 mL, albuterol, Gerhardt's butt cream, haloperidol lactate, heparin sodium (porcine), oxyCODONE **OR** HYDROmorphone (DILAUDID) injection, lip balm, loperamide HCl, mouth rinse

## 2023-08-16 NOTE — Telephone Encounter (Signed)
Sunny Schlein  states that she is trying to facilitate a transfer of this patient to [facility]) patient is currently an inpatient and Dr Royann Shivers is one of the providers who has seen pt while an inpatient.   Explained that this is facilitated by the inpatient providers/units, but will ask for some help. Went to my supervisor, Shirlee More, RN for advice.  Irving Burton encouraged Felicia to address concerns with the bedside nurse and rounding providers. Explained that this cannot be facilitated by [facility].

## 2023-08-16 NOTE — Progress Notes (Signed)
Occupational Therapy Treatment Patient Details Name: [name] MRN: [id number] DOB: [birth year] Today's Date: 08/16/2023   History of present illness Patient is a 42 y/o male admitted 07/09/23 with confusion found to be in septic shock found to have MRSA in blood cultures, in DKA, TEE positive for vegitation on multiple valves, MRI showing R MCA infarct.  Pt with resp failure on 10/21 resulting in intubation and transfer to [facility]; extubated 10/24.  CRRT started 10/22-stopped 10/30.  PMH positive for ESRD on HD, IDDM, gastroparesis, L AKA, HTN.   OT comments  Session initiated at request of RN to optimize safety with transfer to chair. Pt transferred via hoyer lift to chair with max cues for following commands during session and sustaining tasks ("hold on here"). Pt needing max assist to reposition hips once in chair; LUE supported on arm rest and encouraged NT to locate pillow for additional support. Encouraged constant supervision in chair. NT in room on departure.       If plan is discharge home, recommend the following:  Two people to help with walking and/or transfers;Two people to help with bathing/dressing/bathroom;Assistance with cooking/housework;Assistance with feeding;Help with stairs or ramp for entrance;Assist for transportation;Direct supervision/assist for financial management;Direct supervision/assist for medications management   Equipment Recommendations  Other (comment) (defer)    Recommendations for Other Services      Precautions / Restrictions Precautions Precautions: Fall Precaution Comments: LAKA, L hemiparesis, R wrist and ankle restraint, flexiseal, coretrak, blood clot LUE Restrictions Weight Bearing Restrictions: Yes LLE Weight Bearing: Non weight bearing       Mobility Bed Mobility Overal bed mobility: Needs Assistance Bed Mobility: Rolling Rolling: Max assist         General bed mobility comments: Increased time to follow step by step cues and need  for max A to roll L; nearly total A to roll L    Transfers Overall transfer level: Needs assistance   Transfers: Bed to chair/wheelchair/BSC             General transfer comment: total A with maximove; NT educated regarding use and how to optimize safety during transfer as well Transfer via Lift Equipment: Maximove   Balance Overall balance assessment: Needs assistance                                         ADL either performed or assessed with clinical judgement   ADL Overall ADL's : Needs assistance/impaired     Grooming: Moderate assistance;Sitting Grooming Details (indicate cue type and reason): poor ability to sustain attention             Lower Body Dressing: Total assistance Lower Body Dressing Details (indicate cue type and reason): to don R sock                    Extremity/Trunk Assessment Upper Extremity Assessment Upper Extremity Assessment: Left hand dominant;LUE deficits/detail LUE Deficits / Details: No active movement; unable to detect stimuli poor awareness of arm in space and no awareness or attempts to protect during mobility   Lower Extremity Assessment Lower Extremity Assessment: Defer to PT evaluation        Vision   Vision Assessment?: No apparent visual deficits   Perception     Praxis      Cognition Arousal: Alert Behavior During Therapy: Flat affect Overall Cognitive Status: Impaired/Different from baseline Area of Impairment:  Orientation, Attention, Memory, Following commands, Safety/judgement, Awareness, Problem solving                 Orientation Level: Disoriented to, Time, Situation Current Attention Level: Focused Memory: Decreased short-term memory Following Commands: Follows one step commands with increased time, Follows one step commands inconsistently Safety/Judgement: Decreased awareness of safety, Decreased awareness of deficits Awareness: Intellectual Problem Solving: Slow  processing, Decreased initiation, Difficulty sequencing, Requires verbal cues, Requires tactile cues General Comments: Pt perseverative on topics of food and his car. needing max cues to attend to task and follow commands. Pt with some awareness of need to mobilize to move forward with rehabilitation options.        Exercises Exercises: Other exercises Other Exercises Other Exercises: PROM LUE shoulder/scapula/elbow /wrist hand 15x ea joint/movement. pt with limited awareness OT ranging hand/arm despite cues to attend Other Exercises: Worked on sitting balance/core control EOB. Pt meeding from mAx A to min A by end of session. Splint donned at end of session. Re-reviewed care and wear schedule with nursing staff.    Shoulder Instructions       General Comments VSS    Pertinent Vitals/ Pain       Pain Assessment Pain Assessment: Faces Faces Pain Scale: Hurts little more Pain Location: stomach Pain Descriptors / Indicators: Discomfort Pain Intervention(s): Limited activity within patient's tolerance, Monitored during session  Home Living                                          Prior Functioning/Environment              Frequency  Min 2X/week        Progress Toward Goals  OT Goals(current goals can now be found in the care plan section)  Progress towards OT goals: Progressing toward goals  Acute Rehab OT Goals Patient Stated Goal: eat normal food OT Goal Formulation: With patient/family Time For Goal Achievement: 08/16/23 Potential to Achieve Goals: Good ADL Goals Pt Will Perform Grooming: with min assist;sitting Pt Will Perform Lower Body Bathing: with mod assist;sit to/from stand Pt Will Perform Lower Body Dressing: sitting/lateral leans;with max assist Pt Will Transfer to Toilet: with max assist;with transfer board;bedside commode Pt Will Perform Toileting - Clothing Manipulation and hygiene: with mod assist;sitting/lateral  leans Pt/caregiver will Perform Home Exercise Program: Increased ROM;Left upper extremity Additional ADL Goal #1: Pt will follow all one step commands with no more than min cues Additional ADL Goal #2: Pt will tolerate LUE ROM and wear schedule for L resting hand splint.  Plan      Co-evaluation    PT/OT/SLP Co-Evaluation/Treatment: Yes Reason for Co-Treatment: Necessary to address cognition/behavior during functional activity;For patient/therapist safety;To address functional/ADL transfers (Simultaneous filing. User may not have seen previous data.) PT goals addressed during session: Mobility/safety with mobility;Balance (Simultaneous filing. User may not have seen previous data.) OT goals addressed during session: Strengthening/ROM      AM-PAC OT "6 Clicks" Daily Activity     Outcome Measure   Help from another person eating meals?: A Lot Help from another person taking care of personal grooming?: A Lot Help from another person toileting, which includes using toliet, bedpan, or urinal?: Total Help from another person bathing (including washing, rinsing, drying)?: Total Help from another person to put on and taking off regular upper body clothing?: Total Help from another person to put on  and taking off regular lower body clothing?: Total 6 Click Score: 8    End of Session    OT Visit Diagnosis: Other abnormalities of gait and mobility (R26.89);Other symptoms and signs involving the nervous system (R29.898);Hemiplegia and hemiparesis;Muscle weakness (generalized) (M62.81);Other symptoms and signs involving cognitive function Hemiplegia - caused by: Cerebral infarction   Activity Tolerance Patient tolerated treatment well   Patient Left in chair;with nursing/sitter in room (NT in room)   Nurse Communication Mobility status;Precautions        Time: 9518-8416 OT Time Calculation (min): 38 min  Charges: OT General Charges $OT Visit: 1 Visit OT Treatments $Self Care/Home  Management : 8-22 mins $Therapeutic Activity: 38-52 mins  [name], OTD, OTR/L [facility] Acute Rehabilitation Office: [phone]   [name] 08/16/2023, 3:38 PM

## 2023-08-16 NOTE — Progress Notes (Signed)
Speech Language Pathology Treatment: Dysphagia  Patient Details Name: [name] MRN: [id number] DOB: [birth year] Today's Date: 08/16/2023 Time: 1478-2956 SLP Time Calculation (min) (ACUTE ONLY): 61 min  Assessment / Plan / Recommendation Clinical Impression  [name] was seen this am to assess for readiness for diet advancement. He has been on dysphagia 1 since 11/8 due to a primary oral dysphagia with poor awareness of POs in left side of mouth, leading to significant pocketing and decreased ability to chew/control solids. Kitchen provided a trial tray with pancakes/sausage. Tech helped to reposition pt in bed. [name] had difficulty sustaining attention to the meal, requiring ongoing verbal cues to feed himself once fork was loaded with food. However, he showed improved ability to chew regular solids and clear left cheek of residue.  He needed intermittent prompts to attend to left side.  Overall, improvement in attention and mastication will allow diet to be advanced to regular solids.  Continue nectar thick liquids for now.  Give meds whole in puree.  RD has started calorie count.  SLP will follow. D/W [name], who arrived at end of session.   HPI HPI: Pt is a 42 yo male presenting 10/11 with AMS. His dialysis catheter was partially removed and he was in DKA. Admitted for septic shock from MRSA bacteremia; infective endocarditis. LUE weakness was noted 10/17 and MRI confirmed sizable infarct in the R MCA branch as well as small acute infarcts in the L occipital lobe and bilateral cerebellum. Pt initially evaluated by SLP 10/18 and recommended to have Dys 3 diet and thin liquids due primarily oral symptoms. Pt had worsening hypoxia and AMS after vomiting on 10/21 requiring intubation 10/21-10/24. 10/22-10/30 CRRT. 11/08 MBS, dysphagia 1/nectar. PMH includes: ESRD, DM, gastroparesis, L AKA, HTN, recent admission in July for sepsis secondary to R diabetic foot ulcer (left AMA)      SLP  Plan  Continue with current plan of care      Recommendations for follow up therapy are one component of a multi-disciplinary discharge planning process, led by the attending physician.  Recommendations may be updated based on patient status, additional functional criteria and insurance authorization.    Recommendations  Diet recommendations: Regular;Nectar-thick liquid Liquids provided via: Cup;Straw Medication Administration: Whole meds with puree Supervision: Staff to assist with self feeding Compensations: Minimize environmental distractions;Slow rate;Small sips/bites;Lingual sweep for clearance of pocketing;Monitor for anterior loss Postural Changes and/or Swallow Maneuvers: Seated upright 90 degrees                  Oral care BID   Frequent or constant Supervision/Assistance Dysphagia, oropharyngeal phase (R13.12)     Continue with current plan of care   Xabi Wittler L. Samson Frederic, MA CCC/SLP Clinical Specialist - Acute Care SLP Acute Rehabilitation Services Office number [phone]   [name]  08/16/2023, 11:20 AM

## 2023-08-16 NOTE — Progress Notes (Signed)
[facility] PROGRESS NOTE    Progress Note  KAYLUB CARRAZCO  ZOX:[id number] DOB: 09-18-1981 DOA: 07/09/2023 PCP: [name], NP     Brief Narrative:   [name] is an 42 y.o. male past medical history of end-stage renal disease on hemodialysis Monday Wednesday Friday, insulin-dependent diabetes mellitus, gastroparesis comes into close is: 07/08/2022 with altered mental status, she was recently seen in the the hospital on 04/09/2023 for sepsis with diabetic foot cultures grew MRSA during that time patient had catheter removed and replaced ID was consulted who recommended IV vancomycin.  Patient refused echo and left AGAINST MEDICAL ADVICE on 04/14/2023 was found confused by family members on 07/09/2023 brought into the ED and found to be encephalopathic with agitation, her blood glucose was 500 started on IV fluids and IV insulin for DKA CT of the head showed no acute findings.  CT scan of the abdomen and pelvis showed no acute findings but it did show hepatic steatosis, with a liver abnormality 7 x 4 cm septic with a white count of 20 was fluid resuscitated and started empirically on IV Vanco and Zosyn also.  Her blood pressure became soft PCCM was consulted for an ICU admission as she became hypoxic and worsen over night on 07/19/2023.  Had to be intubated eventually extubated.    Significant Events: 10/12: altered mental status, DKA 10/12: blood cultures positive for MRSA 10/13 TTE LVEF 25-30% w/ severely depressed LVF, global hypokinesis, gd I diastolic dysfxn, mod RV fxn reduction. Possible filamentous structure on MV 10/16 TEE LVEF 30-35%RV fxn nml. Large mass (2.00 cm x 0.88 cm ) on the posterior mitral leaflet which appeared to be extending in the mitral annulus. There are two mobile masses visible on the anterior leaflet of the tricupid valve which also appears to have a perforation. Another mobile  mass ( 0.68 cm x 0.67 cm) on the posterior tricupid leaflet. Moderate  Tricuspid regurgitation which appears to  be through the leaflet perforation  10/16 seen by Cardiothoracic surg. Felt poor candidate. Recommended 6 weeks abx and recommended repeat TEE 1 week 10/17 noting left arm weakness. Not clear when started.  MRI showed Right MCA infarct w/ acute infarcts in left occipital and bilateral cerebellum felt c/w septic emboli. Neuro consulted.  10/18 tunneled left femoral vasc cath placed after catheter holiday  10/20 palliative care consulted later that day worsening hypoxia. Sats 70s. After vomiting. More lethargic  10/21: resp failure requiring intubation shortly after midnight  10/22- TEE 10/22 with EF 35%, LV global hypokinesis. Very large vegetation lateral scallop of posterior mitral leaflet 3.9cm length 1 cm width, highly mobile, prolapsing into LV. No large vegetation seen on TV.  Later that afternoon and abrupt hemodynamic changes requiring escalation of vasopressor support.  Felt given the size of his vegetation and increased bulk of the vegetation infections unlikely to clear.  He was felt to be decompensation may have due to of breath leaflet perforation and acute mitral valve regurgb 10/23: per nursing less responsive than yesterday, increased ETT secretions. Clotting off CRRT. Ct head showing expected evolution of the right MCA stroke.  Cardiothoracic surgery re-consulted for worsening MVE. Felt too high risk here. Recommended referral to tertiary care  10/24 agitated when sedation decreased, is purposeful but does not follow commands. Tolerating UF gaols of -150. Still on NE infusion but extubated later that afternoon after passing SBT 10/25 failed swallow eval still on just 3 lpm O2. Encephalopathic. Added seroquel in effort to get  him off the precedex. Cardiology told no beds at [facility]. Not clear if accepted or not. Worsening pressor requirements. Got blood for hgb 6. Cefepime resumed for clinical decline and leukocytosis  10/26 Hemoglobin again dropped to  6.3 overnight with increased pressor requirement yesterday afternoon now on vaso and levo. 10/27 Hypoglycemic episode overnight, mental status improved a little over weekend  10/28 Getting another unit of blood more awake.  Oriented x 2.  Still on Precedex.  Having liquid stools. 10/30 Off Precedex, low-dose levo.  Plan to transition to IHD 10/29 Cefepime for aspiration course completed.  10/31 Seen by overnight team for hypoxia and somnolence ABG with mild hypercapnia. Again declined by [facility] because they were at capacity. More awake. Doing better w/ Bed side swallow but still likely aspirating. CRRT stopped.  11/1 Adjustments made for better glycemic control.  Much more awake but a little confused 11/4 Remains on low dose levo 11/5; weaned off pressors and transferred to progressive care unit Patient was extubated, transferred to progressive care unit under [facility] service on 08/04/2023  11/7-called [facility], at capacity.  Declined transfer 11/12-femoral line removed; palliative care consulted for goals of care 11/12-called [facility] for transfer, declined due to capacity 11/13 no changes. Will pursue LTACH options 11/14 no changes. Calmer today. Pt's mother, [name], would like to cont full aggressive care.  11/15 discussed possibility of PEG tube with patient's mother at this time she like to continue to increase oral feeds.   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 He is currently on midodrine with dialysis his blood pressure is holding up. Has defervesced leukocytosis improved. Has been denied for surgical intervention by [facility] CT surgery, [facility]  Evolving cavitary pneumonia with mucous plugging: Generally on IV vancomycin per ID recommendations. Has been weaned to room air. Probably due to tricuspid endocarditis  Acute on chronic combined systolic and diastolic  heart failure/possible cardiogenic shock: 2D echo showed an EF of 30% and vegetation tricuspid and mitral valve with severe regurgitation. Started on low-dose metoprolol, which she remains tachycardic several times have been made to increase metoprolol due to her persistent tachycardia but remains normotensive. Will probably require surgical intervention of valves, CT surgery was consulted recommended transfer to tertiary center due to high risk of patient procedure. [facility] has declined the patient transferred time to due to capacity to.[facility] denied for multiple prohibitive surgical risk. Last time [facility] was called was on 08/10/2023 and they declined due to capacity.  Ongoing sinus tachycardia: Has improved after initiating metoprolol on 08/11/2023, IV Dilaudid was decreased and his blood pressure is trending up. Will go ahead and increase metoprolol today. Try to keep potassium greater than 4 magnesium greater than 2. Currently on oral midodrine.  Lower extremity DVT: Positive on 08/02/2023, now on apixaban.  Acute metabolic encephalopathy with delirium and agitation/right MCA CVA: MRI of the brain on 07/15/2023 showed an M2 occlusion found to have a right MCA territory infarct favored septic emboli. Currently on restraints. Avoid Klonopin use Seroquel at bedtime Haldol IV as needed. Was able to participate with PT OT will need LTAC.  End-stage renal disease on hemodialysis Monday Wednesday Friday: Nephrology following, he was successfully transferred to [facility] on 08/11/2023 Status post line holiday without catheter removal on 07/14/2023. IR placed tunneled catheter on 07/16/2023. Lites and volume status per nephrology.  Dysphagia: Tube feedings via core track, now on dysphagia  1 diet. Patient is significantly more awake today able to carry on a conversation he relates he is hungry and he would like to eat. The previous physician had extensive discussion with patient and family on 11/15 and  then again 11/16. Refuse PEG tube. Will try to modify them with dysphagia 1 diet. Once we stop tube feedings we could discontinue rectal tube.  Hyperglycemia/DKA/diabetes mellitus type 2: Continue CBGs Q4 and sliding scale insulin.  Anemia/thrombocytopenia due to critical illness: Status post 5 units of packed red blood cells, hemoglobin has remained relatively stable.  Incidental liver mass: Seen on CT scan of the abdomen pelvis 7 x 4 cm anterior liver mass. Will need MRI as an outpatient.  Hyponatremia: Further management per nephrology.  Goals of care: Overall poor prognosis, unfortunately patient remain high risk of decompensating at this time.  Extensive discussion has been had with mother on 08/14/2023 and 08/13/2023 about prognosis. We have explained to her he has multiorgan failure and they have consulted palliative care. During that time the mother wishes to proceed with full aggressive treatment.  Unfortunately the patient does not have a consistent source of nutrition. [name] (mother wishes to continue oral intake) Attempts were made to place patient in LTAC however was declined due to lack of consistent nutrition. LTAC reports that if were able to place a PEG tube then we can reapply for LTAC placement. TOC is informed. Has been declining for transfer to [facility]  Stage IIc decubitus ulcer: RN Pressure Injury Documentation: Pressure Injury 08/03/23 Buttocks Left Stage 2 -  Partial thickness loss of dermis presenting as a shallow open injury with a red, pink wound bed without slough. Several small open areas, right buttocks area (Active)  08/03/23 1945  Location: Buttocks  Location Orientation: Left  Staging: Stage 2 -  Partial thickness loss of dermis presenting as a shallow open injury with a red, pink wound bed without slough.  Wound Description (Comments): Several small open areas, right buttocks area  Present on Admission: No  Dressing Type  Foam - Lift dressing to assess site every shift 08/15/23 0753    DVT prophylaxis: lovenox Family Communication:Mother Status is: Inpatient Remains inpatient appropriate because: Septic shock    Code Status:     Code Status Orders  (From admission, onward)           Start     Ordered   07/10/23 0130  Full code  Continuous       Question:  By:  Answer:  Consent: discussion documented in EHR   07/10/23 0131           Code Status History     Date Active Date Inactive Code Status Order ID Comments User Context   07/10/2023 0119 07/10/2023 0131 Full Code [id number]  Patrici Ranks, MD ED   04/09/2023 2237 04/15/2023 0003 Full Code [id number]  Briscoe Deutscher, MD ED   08/09/2021 1156 08/12/2021 0242 Full Code [id number]  Dellia Cloud, MD ED   05/19/2021 2107 05/23/2021 2342 Full Code [id number]  Hillary Bow, DO ED   10/14/2020 1357 10/22/2020 0740 Full Code [id number]  Emeline General, MD Inpatient   09/11/2020 0424 10/12/2020 0833 Full Code [id number]  Chotiner, Claudean Severance, MD ED   07/22/2020 0535 07/22/2020 2355 Full Code [id number]  Delano Metz, MD Inpatient   07/22/2020 0525 07/22/2020 0535 Full Code [id number]  Charlotte Sanes, MD ED   05/31/2020 0030 05/31/2020 0621 Full Code [id number]  Swayze,  Ava, DO ED   02/14/2020 1159 02/18/2020 1935 Full Code [id number]  Erick Blinks, MD ED   01/31/2020 0317 02/01/2020 1645 Full Code [id number]  Eduard Clos, MD ED   04/24/2019 1358 04/27/2019 2047 Full Code [id number]  Lorenso Courier, MD ED   10/26/2018 1950 11/03/2018 1944 Full Code [id number]  Haydee Monica, MD ED   10/20/2018 1730 10/25/2018 2038 Full Code [id number]  Elease Etienne, MD Inpatient   08/17/2018 2332 08/25/2018 1645 Full Code [id number]  John Giovanni, MD Inpatient   02/01/2018 1446 02/06/2018 0032 Full Code [id number]  Gwynn Burly, DO ED   10/25/2017 2225 11/03/2017 2322 Full Code [id number]  Clydie Braun, MD ED   11/16/2016 1338 11/16/2016 1649 Full Code [id number]   Sherren Kerns, MD Inpatient   05/27/2015 0707 05/29/2015 1809 Full Code [id number]  Arnaldo Natal Inpatient         IV Access:   Peripheral IV   Procedures and diagnostic studies:   No results found.   Medical Consultants:   None.   Subjective:    [name] awake today in a good mood wants to eat  Objective:    Vitals:   08/15/23 1652 08/15/23 1940 08/15/23 2339 08/16/23 0400  BP: 123/68 115/86 123/81 133/86  Pulse: (!) 121 (!) 124 (!) 119 (!) 122  Resp: 19 20 16 18   Temp: 98 F (36.7 C) 97.6 F (36.4 C) 98.3 F (36.8 C)   TempSrc: Oral Oral Oral   SpO2: 97% 93% 98% 98%  Weight:    76.7 kg  Height:       SpO2: 98 % O2 Flow Rate (L/min): 4 L/min FiO2 (%): 32 %  No intake or output data in the 24 hours ending 08/16/23 0717 Filed Weights   08/14/23 0300 08/15/23 0616 08/16/23 0400  Weight: 75.8 kg 75.8 kg 76.7 kg    Exam: General exam: In no acute distress. Respiratory system: Good air movement and clear to auscultation. Cardiovascular system: S1 & S2 heard, RRR. No JVD. Gastrointestinal system: Abdomen is nondistended, soft and nontender.  Extremities: No pedal edema. Skin: No rashes, lesions or ulcers Psychiatry: Judgement and insight appear normal. Mood & affect appropriate.    Data Reviewed:    Labs: Basic Metabolic Panel: Recent Labs  Lab 08/09/23 1130 08/10/23 0500 08/11/23 0223 08/11/23 0906 08/12/23 0236 08/13/23 0800 08/14/23 0224 08/15/23 0219  NA 133*  --   --  128*  --  128* 131* 126*  K 4.4  --   --  4.2  --  4.9 4.0 4.4  CL 98  --   --  90*  --  92* 92* 89*  CO2 22  --   --  27  --  23 27 25   GLUCOSE 209*  --   --  191*  --  316* 250* 340*  BUN 86*  --   --  65*  --  80* 47* 80*  CREATININE 6.46*  --   --  6.04*  --  6.11* 3.78* 5.37*  CALCIUM 9.0  --   --  9.4  --  9.8 9.5 9.8  MG  --    < > 3.0*  --  2.8* 3.2* 2.5* 2.8*  PHOS 4.7*  --   --   --   --  4.8* 3.2 4.2   < > = values in this interval not  displayed.   GFR Estimated Creatinine Clearance: 16.6 mL/min (A) (by C-G formula based  on SCr of 5.37 mg/dL (H)). Liver Function Tests: Recent Labs  Lab 08/09/23 1130 08/11/23 0906 08/13/23 0800 08/14/23 0224 08/15/23 0219  AST  --  21 21 18 18   ALT  --  14 11 11 11   ALKPHOS  --  173* 154* 143* 178*  BILITOT  --  0.7 0.6 0.8 0.8  PROT  --  6.8 7.4 7.7 8.0  ALBUMIN 2.0* 2.0* 2.3* 2.3* 2.5*   No results for input(s): "LIPASE", "AMYLASE" in the last 168 hours. No results for input(s): "AMMONIA" in the last 168 hours. Coagulation profile No results for input(s): "INR", "PROTIME" in the last 168 hours. COVID-19 Labs  No results for input(s): "DDIMER", "FERRITIN", "LDH", "CRP" in the last 72 hours.  Lab Results  Component Value Date   SARSCOV2NAA NEGATIVE 07/10/2023   SARSCOV2NAA NEGATIVE 04/09/2023   SARSCOV2NAA NEGATIVE 08/09/2021   SARSCOV2NAA NEGATIVE 05/19/2021    CBC: Recent Labs  Lab 08/11/23 0906 08/13/23 0800 08/14/23 0224 08/15/23 0219  WBC 12.2* 10.8* 10.1 11.3*  NEUTROABS 9.7* 7.8* 7.8* 8.4*  HGB 7.5* 8.4* 8.6* 9.9*  HCT 24.6* 27.6* 28.1* 32.1*  MCV 96.1 95.8 93.7 93.9  PLT 294 323 355 380   Cardiac Enzymes: No results for input(s): "CKTOTAL", "CKMB", "CKMBINDEX", "TROPONINI" in the last 168 hours. BNP (last 3 results) No results for input(s): "PROBNP" in the last 8760 hours. CBG: Recent Labs  Lab 08/15/23 1108 08/15/23 1651 08/15/23 1954 08/15/23 2338 08/16/23 0403  GLUCAP 205* 150* 141* 159* 177*   D-Dimer: No results for input(s): "DDIMER" in the last 72 hours. Hgb A1c: No results for input(s): "HGBA1C" in the last 72 hours. Lipid Profile: No results for input(s): "CHOL", "HDL", "LDLCALC", "TRIG", "CHOLHDL", "LDLDIRECT" in the last 72 hours. Thyroid function studies: No results for input(s): "TSH", "T4TOTAL", "T3FREE", "THYROIDAB" in the last 72 hours.  Invalid input(s): "FREET3" Anemia work up: No results for input(s): "VITAMINB12",  "FOLATE", "FERRITIN", "TIBC", "IRON", "RETICCTPCT" in the last 72 hours. Sepsis Labs: Recent Labs  Lab 08/11/23 0906 08/13/23 0800 08/14/23 0224 08/15/23 0219  WBC 12.2* 10.8* 10.1 11.3*   Microbiology No results found for this or any previous visit (from the past 240 hour(s)).   Medications:    apixaban  5 mg Oral BID   calcitRIOL  1.5 mcg Oral Q M,W,F   Chlorhexidine Gluconate Cloth  6 each Topical Q0600   Chlorhexidine Gluconate Cloth  6 each Topical Q0600   darbepoetin (ARANESP) injection - DIALYSIS  100 mcg Subcutaneous Q Fri-1800   feeding supplement (PROSource TF20)  60 mL Per Tube Daily   fiber supplement (BANATROL TF)  60 mL Per Tube BID   gabapentin  300 mg Oral QHS   insulin aspart  0-5 Units Subcutaneous QHS   insulin aspart  0-9 Units Subcutaneous TID WC   insulin aspart  3 Units Subcutaneous TID WC   insulin glargine-yfgn  15 Units Subcutaneous Daily   metoprolol tartrate  25 mg Oral BID   midodrine  10 mg Oral Q M,W,F-HD   multivitamin  1 tablet Oral QHS   nutrition supplement (JUVEN)  1 packet Oral BID BM   mouth rinse  15 mL Mouth Rinse 4 times per day   QUEtiapine  50 mg Oral Q0600   Continuous Infusions:  feeding supplement (NEPRO CARB STEADY) 1,000 mL (08/16/23 0342)   vancomycin 750 mg (08/13/23 1742)      LOS: 37 days   Marinda Elk  [facility]  08/16/2023, 7:17 AM

## 2023-08-16 NOTE — Progress Notes (Deleted)
[facility] PROGRESS NOTE    Progress Note  [name]  ZOX:[id number] DOB: 15-Mar-1981 DOA: 07/09/2023 PCP: [name], NP     Brief Narrative:   [name] is an 42 y.o. male past medical history of end-stage renal disease on hemodialysis Monday Wednesday Friday, insulin-dependent diabetes mellitus, gastroparesis comes into close is: 07/08/2022 with altered mental status, she was recently seen in the the hospital on 04/09/2023 for sepsis with diabetic foot cultures grew MRSA during that time patient had catheter removed and replaced ID was consulted who recommended IV vancomycin.  Patient refused echo and left AGAINST MEDICAL ADVICE on 04/14/2023 was found confused by family members on 07/09/2023 brought into the ED and found to be encephalopathic with agitation, her blood glucose was 500 started on IV fluids and IV insulin for DKA CT of the head showed no acute findings.  CT scan of the abdomen and pelvis showed no acute findings but it did show hepatic steatosis, with a liver abnormality 7 x 4 cm septic with a white count of 20 was fluid resuscitated and started empirically on IV Vanco and Zosyn also.  Her blood pressure became soft PCCM was consulted for an ICU admission as she became hypoxic and worsen over night on 07/19/2023.  Had to be intubated eventually extubated.    Significant Events: 10/12: altered mental status, DKA 10/12: blood cultures positive for MRSA 10/13 TTE LVEF 25-30% w/ severely depressed LVF, global hypokinesis, gd I diastolic dysfxn, mod RV fxn reduction. Possible filamentous structure on MV 10/16 TEE LVEF 30-35%RV fxn nml. Large mass (2.00 cm x 0.88 cm ) on the posterior mitral leaflet which appeared to be extending in the mitral annulus. There are two mobile masses visible on the anterior leaflet of the tricupid valve which also appears to have a perforation. Another mobile  mass ( 0.68 cm x 0.67 cm) on the posterior tricupid leaflet. Moderate  Tricuspid regurgitation which appears to  be through the leaflet perforation  10/16 seen by Cardiothoracic surg. Felt poor candidate. Recommended 6 weeks abx and recommended repeat TEE 1 week 10/17 noting left arm weakness. Not clear when started.  MRI showed Right MCA infarct w/ acute infarcts in left occipital and bilateral cerebellum felt c/w septic emboli. Neuro consulted.  10/18 tunneled left femoral vasc cath placed after catheter holiday  10/20 palliative care consulted later that day worsening hypoxia. Sats 70s. After vomiting. More lethargic  10/21: resp failure requiring intubation shortly after midnight  10/22- TEE 10/22 with EF 35%, LV global hypokinesis. Very large vegetation lateral scallop of posterior mitral leaflet 3.9cm length 1 cm width, highly mobile, prolapsing into LV. No large vegetation seen on TV.  Later that afternoon and abrupt hemodynamic changes requiring escalation of vasopressor support.  Felt given the size of his vegetation and increased bulk of the vegetation infections unlikely to clear.  He was felt to be decompensation may have due to of breath leaflet perforation and acute mitral valve regurgb 10/23: per nursing less responsive than yesterday, increased ETT secretions. Clotting off CRRT. Ct head showing expected evolution of the right MCA stroke.  Cardiothoracic surgery re-consulted for worsening MVE. Felt too high risk here. Recommended referral to tertiary care  10/24 agitated when sedation decreased, is purposeful but does not follow commands. Tolerating UF gaols of -150. Still on NE infusion but extubated later that afternoon after passing SBT 10/25 failed swallow eval still on just 3 lpm O2. Encephalopathic. Added seroquel in effort to get  him off the precedex. Cardiology told no beds at [facility]. Not clear if accepted or not. Worsening pressor requirements. Got blood for hgb 6. Cefepime resumed for clinical decline and leukocytosis  10/26 Hemoglobin again dropped to  6.3 overnight with increased pressor requirement yesterday afternoon now on vaso and levo. 10/27 Hypoglycemic episode overnight, mental status improved a little over weekend  10/28 Getting another unit of blood more awake.  Oriented x 2.  Still on Precedex.  Having liquid stools. 10/30 Off Precedex, low-dose levo.  Plan to transition to IHD 10/29 Cefepime for aspiration course completed.  10/31 Seen by overnight team for hypoxia and somnolence ABG with mild hypercapnia. Again declined by [facility] because they were at capacity. More awake. Doing better w/ Bed side swallow but still likely aspirating. CRRT stopped.  11/1 Adjustments made for better glycemic control.  Much more awake but a little confused 11/4 Remains on low dose levo 11/5; weaned off pressors and transferred to progressive care unit Patient was extubated, transferred to progressive care unit under [facility] service on 08/04/2023  11/7-called [facility], at capacity.  Declined transfer 11/12-femoral line removed; palliative care consulted for goals of care 11/12-called [facility] for transfer, declined due to capacity 11/13 no changes. Will pursue LTACH options 11/14 no changes. Calmer today. Pt's mother, [name], would like to cont full aggressive care.  11/15 discussed possibility of PEG tube with patient's mother at this time she like to continue to increase oral feeds.   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 He is currently on midodrine with dialysis his blood pressure is holding up. Has defervesced leukocytosis improved. Has been denied for surgical intervention by [facility] CT surgery, took [facility]. He will need 6 weeks of antibiotic started on 08/08/2023, he has had intermittent fevers likely due to burden of infection due to no source control.  Evolving cavitary pneumonia with mucous plugging: Generally on IV  vancomycin per ID recommendations. Has been weaned to room air. Probably due to tricuspid endocarditis  Acute on chronic combined systolic and diastolic heart failure/possible cardiogenic shock: 2D echo showed an EF of 30% and vegetation tricuspid and mitral valve with severe regurgitation. Started on low-dose metoprolol, which she remains tachycardic several times have been made to increase metoprolol due to her persistent tachycardia but remains normotensive. Will probably require surgical intervention of valves, CT surgery was consulted recommended transfer to tertiary center due to high risk of patient procedure. [facility] has declined the patient transferred time to due to capacity to.[facility] denied for multiple prohibitive surgical risk. Last time [facility] was called was on 08/10/2023 and they declined due to capacity.  Ongoing sinus tachycardia: Has improved after initiating metoprolol on 08/11/2023, IV Dilaudid was decreased and his blood pressure is trending up. Will go ahead and increase metoprolol today. Try to keep potassium greater than 4 magnesium greater than 2. Currently on oral midodrine.  Lower extremity DVT: Positive on 08/02/2023, now on apixaban.  Acute metabolic encephalopathy with delirium and agitation/right MCA CVA: MRI of the brain on 07/15/2023 showed an M2 occlusion found to have a right MCA territory infarct favored septic emboli. Currently on restraints. Avoid Klonopin use Seroquel at bedtime Haldol IV as needed. Was able to participate with PT OT will need LTAC.  End-stage renal disease on hemodialysis Monday Wednesday Friday: Nephrology following, he was successfully transferred to [facility] on 08/11/2023 Status post line holiday without  catheter removal on 07/14/2023. IR placed tunneled catheter on 07/16/2023. Lites and volume status per nephrology.  Dysphagia: Tube feedings via core track, now on dysphagia 1 diet. Patient is significantly more awake today able to  carry on a conversation he relates he is hungry and he would like to eat. The previous physician had extensive discussion with patient and family on 11/15 and then again 11/16. Refuse PEG tube. Will try to modify them with dysphagia 1 diet. Once we stop tube feedings we could discontinue rectal tube.  Hyperglycemia/DKA/diabetes mellitus type 2: Continue CBGs Q4 and sliding scale insulin.  Anemia/thrombocytopenia due to critical illness: Status post 5 units of packed red blood cells, hemoglobin has remained relatively stable.  Incidental liver mass: Seen on CT scan of the abdomen pelvis 7 x 4 cm anterior liver mass. Will need MRI as an outpatient.  Hyponatremia: Further management per nephrology.  Goals of care: Overall poor prognosis, unfortunately patient remain high risk of decompensating at this time.  Extensive discussion has been had with mother on 08/14/2023 and 08/13/2023 about prognosis. We have explained to her he has multiorgan failure and they have consulted palliative care. During that time the mother wishes to proceed with full aggressive treatment.  Unfortunately the patient does not have a consistent source of nutrition. [name] (mother wishes to continue oral intake) Attempts were made to place patient in LTAC however was declined due to lack of consistent nutrition. LTAC reports that if were able to place a PEG tube then we can reapply for LTAC placement. TOC is informed. Has been declining for [facility] to [facility]  Stage IIc decubitus ulcer: RN Pressure Injury Documentation: Pressure Injury 08/03/23 Buttocks Left Stage 2 -  Partial thickness loss of dermis presenting as a shallow open injury with a red, pink wound bed without slough. Several small open areas, right buttocks area (Active)  08/03/23 1945  Location: Buttocks  Location Orientation: Left  Staging: Stage 2 -  Partial thickness loss of dermis presenting as a shallow open injury  with a red, pink wound bed without slough.  Wound Description (Comments): Several small open areas, right buttocks area  Present on Admission: No  Dressing Type Foam - Lift dressing to assess site every shift 08/15/23 0753    DVT prophylaxis: lovenox Family Communication:Mother Status is: Inpatient Remains inpatient appropriate because: Septic shock    Code Status:     Code Status Orders  (From admission, onward)           Start     Ordered   07/10/23 0130  Full code  Continuous       Question:  By:  Answer:  Consent: discussion documented in EHR   07/10/23 0131           Code Status History     Date Active Date Inactive Code Status Order ID Comments User Context   07/10/2023 0119 07/10/2023 0131 Full Code [id number]  Patrici Ranks, MD ED   04/09/2023 2237 04/15/2023 0003 Full Code [id number]  Briscoe Deutscher, MD ED   08/09/2021 1156 08/12/2021 0242 Full Code [id number]  Dellia Cloud, MD ED   05/19/2021 2107 05/23/2021 2342 Full Code [id number]  Hillary Bow, DO ED   10/14/2020 1357 10/22/2020 0740 Full Code [id number]  Emeline General, MD Inpatient   09/11/2020 0424 10/12/2020 0833 Full Code [id number]  Chotiner, Claudean Severance, MD ED   07/22/2020 0535 07/22/2020 2355 Full Code [id number]  Delano Metz, MD  Inpatient   07/22/2020 0525 07/22/2020 0535 Full Code [id number]  Charlotte Sanes, MD ED   05/31/2020 0030 05/31/2020 0621 Full Code [id number]  Swayze, Ava, DO ED   02/14/2020 1159 02/18/2020 1935 Full Code [id number]  Erick Blinks, MD ED   01/31/2020 0317 02/01/2020 1645 Full Code [id number]  Eduard Clos, MD ED   04/24/2019 1358 04/27/2019 2047 Full Code [id number]  Lorenso Courier, MD ED   10/26/2018 1950 11/03/2018 1944 Full Code [id number]  Haydee Monica, MD ED   10/20/2018 1730 10/25/2018 2038 Full Code [id number]  Elease Etienne, MD Inpatient   08/17/2018 2332 08/25/2018 1645 Full Code [id number]  John Giovanni, MD Inpatient   02/01/2018 1446 02/06/2018 0032 Full Code  [id number]  Gwynn Burly, DO ED   10/25/2017 2225 11/03/2017 2322 Full Code [id number]  Clydie Braun, MD ED   11/16/2016 1338 11/16/2016 1649 Full Code [id number]  Sherren Kerns, MD Inpatient   05/27/2015 0707 05/29/2015 1809 Full Code [id number]  Arnaldo Natal Inpatient         IV Access:   Peripheral IV   Procedures and diagnostic studies:   No results found.   Medical Consultants:   None.   Subjective:    [name] awake today in a good mood wants to eat  Objective:    Vitals:   08/15/23 1940 08/15/23 2339 08/16/23 0400 08/16/23 0740  BP: 115/86 123/81 133/86 131/84  Pulse: (!) 124 (!) 119 (!) 122 (!) 122  Resp: 20 16 18  (!) 24  Temp: 97.6 F (36.4 C) 98.3 F (36.8 C)  98.4 F (36.9 C)  TempSrc: Oral Oral  Oral  SpO2: 93% 98% 98% (!) 88%  Weight:   76.7 kg   Height:       SpO2: (!) 88 % O2 Flow Rate (L/min): 4 L/min FiO2 (%): 32 %  No intake or output data in the 24 hours ending 08/16/23 0759 Filed Weights   08/14/23 0300 08/15/23 0616 08/16/23 0400  Weight: 75.8 kg 75.8 kg 76.7 kg    Exam: General exam: In no acute distress. Respiratory system: Good air movement and clear to auscultation. Cardiovascular system: S1 & S2 heard, RRR. No JVD. Gastrointestinal system: Abdomen is nondistended, soft and nontender.  Extremities: No pedal edema. Skin: No rashes, lesions or ulcers Psychiatry: Judgement and insight appear normal. Mood & affect appropriate.    Data Reviewed:    Labs: Basic Metabolic Panel: Recent Labs  Lab 08/09/23 1130 08/10/23 0500 08/11/23 0223 08/11/23 0906 08/12/23 0236 08/13/23 0800 08/14/23 0224 08/15/23 0219  NA 133*  --   --  128*  --  128* 131* 126*  K 4.4  --   --  4.2  --  4.9 4.0 4.4  CL 98  --   --  90*  --  92* 92* 89*  CO2 22  --   --  27  --  23 27 25   GLUCOSE 209*  --   --  191*  --  316* 250* 340*  BUN 86*  --   --  65*  --  80* 47* 80*  CREATININE 6.46*  --   --  6.04*  --  6.11* 3.78* 5.37*   CALCIUM 9.0  --   --  9.4  --  9.8 9.5 9.8  MG  --    < > 3.0*  --  2.8* 3.2* 2.5* 2.8*  PHOS 4.7*  --   --   --   --  4.8* 3.2 4.2   < > = values in this interval not displayed.   GFR Estimated Creatinine Clearance: 16.6 mL/min (A) (by C-G formula based on SCr of 5.37 mg/dL (H)). Liver Function Tests: Recent Labs  Lab 08/09/23 1130 08/11/23 0906 08/13/23 0800 08/14/23 0224 08/15/23 0219  AST  --  21 21 18 18   ALT  --  14 11 11 11   ALKPHOS  --  173* 154* 143* 178*  BILITOT  --  0.7 0.6 0.8 0.8  PROT  --  6.8 7.4 7.7 8.0  ALBUMIN 2.0* 2.0* 2.3* 2.3* 2.5*   No results for input(s): "LIPASE", "AMYLASE" in the last 168 hours. No results for input(s): "AMMONIA" in the last 168 hours. Coagulation profile No results for input(s): "INR", "PROTIME" in the last 168 hours. COVID-19 Labs  No results for input(s): "DDIMER", "FERRITIN", "LDH", "CRP" in the last 72 hours.  Lab Results  Component Value Date   SARSCOV2NAA NEGATIVE 07/10/2023   SARSCOV2NAA NEGATIVE 04/09/2023   SARSCOV2NAA NEGATIVE 08/09/2021   SARSCOV2NAA NEGATIVE 05/19/2021    CBC: Recent Labs  Lab 08/11/23 0906 08/13/23 0800 08/14/23 0224 08/15/23 0219  WBC 12.2* 10.8* 10.1 11.3*  NEUTROABS 9.7* 7.8* 7.8* 8.4*  HGB 7.5* 8.4* 8.6* 9.9*  HCT 24.6* 27.6* 28.1* 32.1*  MCV 96.1 95.8 93.7 93.9  PLT 294 323 355 380   Cardiac Enzymes: No results for input(s): "CKTOTAL", "CKMB", "CKMBINDEX", "TROPONINI" in the last 168 hours. BNP (last 3 results) No results for input(s): "PROBNP" in the last 8760 hours. CBG: Recent Labs  Lab 08/15/23 1108 08/15/23 1651 08/15/23 1954 08/15/23 2338 08/16/23 0403  GLUCAP 205* 150* 141* 159* 177*   D-Dimer: No results for input(s): "DDIMER" in the last 72 hours. Hgb A1c: No results for input(s): "HGBA1C" in the last 72 hours. Lipid Profile: No results for input(s): "CHOL", "HDL", "LDLCALC", "TRIG", "CHOLHDL", "LDLDIRECT" in the last 72 hours. Thyroid function  studies: No results for input(s): "TSH", "T4TOTAL", "T3FREE", "THYROIDAB" in the last 72 hours.  Invalid input(s): "FREET3" Anemia work up: No results for input(s): "VITAMINB12", "FOLATE", "FERRITIN", "TIBC", "IRON", "RETICCTPCT" in the last 72 hours. Sepsis Labs: Recent Labs  Lab 08/11/23 0906 08/13/23 0800 08/14/23 0224 08/15/23 0219  WBC 12.2* 10.8* 10.1 11.3*   Microbiology No results found for this or any previous visit (from the past 240 hour(s)).   Medications:    apixaban  5 mg Oral BID   calcitRIOL  1.5 mcg Oral Q M,W,F   Chlorhexidine Gluconate Cloth  6 each Topical Q0600   Chlorhexidine Gluconate Cloth  6 each Topical Q0600   darbepoetin (ARANESP) injection - DIALYSIS  100 mcg Subcutaneous Q Fri-1800   feeding supplement (PROSource TF20)  60 mL Per Tube Daily   fiber supplement (BANATROL TF)  60 mL Per Tube BID   gabapentin  300 mg Oral QHS   insulin aspart  0-5 Units Subcutaneous QHS   insulin aspart  0-9 Units Subcutaneous TID WC   insulin aspart  3 Units Subcutaneous TID WC   insulin glargine-yfgn  15 Units Subcutaneous Daily   metoprolol tartrate  50 mg Oral BID   midodrine  10 mg Oral Q M,W,F-HD   multivitamin  1 tablet Oral QHS   nutrition supplement (JUVEN)  1 packet Oral BID BM   mouth rinse  15 mL Mouth Rinse 4 times per day   QUEtiapine  50 mg Oral Q0600   Continuous Infusions:  feeding supplement (NEPRO CARB STEADY) 1,000 mL (08/16/23 0342)  vancomycin 750 mg (08/13/23 1742)      LOS: 37 days   Marinda Elk  [facility]  08/16/2023, 7:59 AM

## 2023-08-16 NOTE — Consult Note (Addendum)
WOC Nurse Consult Note: Reason for Consult: Requested to assess a Right foot ulcer Wound type: Callus on bottom, great toe, and third toe has signs of ischemia.  Measurement: on the bottom: 4x6cm Great Toe 2x2 cm Third toe, callus on the top close to the nail. 1x1,5cm Any skin breakdown after take off the callus below the foot.  AVA results in July/24. Right: Resting right ankle- brachial index indicates noncompressible right lower extremity arteries. The right toe- brachial index is abnormal.  Dressing procedure/placement/frequency: Keeps open and dry.  Maguire Sime Silvestre Mesi Acquanetta Sit WOC nurse  Cammie Mcgee WOC nurse

## 2023-08-16 NOTE — TOC Progression Note (Addendum)
Transition of Care [facility]) - Progression Note    Patient Details  Name: [name] MRN: [id number] Date of Birth: [birth year]  [facility] CM/SW Contact  Harriet Masson, RN Phone Number: 08/16/2023, 11:18 AM  Clinical Narrative:    [facility] Monday to [place] [facility] with [facility] who stated insurance declined LTAC due to patient needing a PEG. Mother has declined PEG at this time. RD recommends 48 hour calorie count and speech consult. [facility] will request another insurance authorization once patient is eating.   Dialysis will trail HD in a chair today or Wednesday.   TOC following       Barriers to Discharge: Continued Medical Work up  Expected Discharge Plan and Services       Living arrangements for the past 2 months: Single Family Home                                       Social Determinants of Health (SDOH) Interventions SDOH Screenings   Food Insecurity: No Food Insecurity (08/03/2023)  Housing: Low Risk  (08/03/2023)  Transportation Needs: No Transportation Needs (08/03/2023)  Utilities: Not At Risk (08/03/2023)  Alcohol Screen: Low Risk  (02/15/2023)  Depression (PHQ2-9): Low Risk  (02/15/2023)  Financial Resource Strain: Low Risk  (02/15/2023)  Physical Activity: Inactive (02/15/2023)  Social Connections: Socially Integrated (02/15/2023)  Stress: No Stress Concern Present (02/15/2023)  Tobacco Use: Low Risk  (08/03/2023)    Readmission Risk Interventions     No data to display

## 2023-08-16 NOTE — Progress Notes (Signed)
Pharmacy Antibiotic Note  [name] is a 42 y.o. 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 Pharmacy has been consulted for Vancomycin dosing.  11/15 update: Vancomycin random today pre- HD is 22 and well within range. Next HD planned today.   11/18- HD MWF / next vanco random 11/22  Plan: - Vancomycin 750 mg IV q HD MWF  - Will monitor HD tolerance and dosing - Plan weekly levels on Friday with note  - Tentative stop date: 09/19/23 - entered    Height: 5\' 7"  (170.2 cm) Weight: 76.7 kg (169 lb) IBW/kg (Calculated) : 66.1  Temp (24hrs), Avg:98 F (36.7 C), Min:97.6 F (36.4 C), Max:98.4 F (36.9 C)  Recent Labs  Lab 08/09/23 1130 08/11/23 0906 08/13/23 0800 08/14/23 0224 08/15/23 0219  WBC  --  12.2* 10.8* 10.1 11.3*  CREATININE 6.46* 6.04* 6.11* 3.78* 5.37*  VANCORANDOM  --   --  22  --   --     Estimated Creatinine Clearance: 16.6 mL/min (A) (by C-G formula based on SCr of 5.37 mg/dL (H)).    No Known Allergies  Antimicrobials this admission: Vancomycin 10/12 >>(12/22) Cefepime 10/21 >> 10/23; restart 10/25 >> 10/29  Microbiology results: 10/12 MRSA PCR >> positive 10/12 CDiff antigen +, toxin -, PCR - >> negative 10/12 BCx >> 4/4 MRSA 10/13 BCx >> NGf 10/17 BCx >> NGf 10/21 RCx >> NOF  Thank you for allowing pharmacy to be a part of this patient's care.  Greta Doom BS, PharmD, BCPS Clinical Pharmacist 08/16/2023 8:05 AM  Contact: [phone] after 3 PM  "Be curious, not judgmental..." -Debbora Dus

## 2023-08-16 NOTE — Telephone Encounter (Signed)
Dr. Royann Shivers saw the patient in hospital, and the nurse is needed the care team from [facility] to contact the care team at [facility] to get the patient transfer. Please advise

## 2023-08-16 NOTE — Progress Notes (Signed)
Brief Nutrition Note  Discussed pt with RN and MD. Pt last seen by RD for full Nutrition Follow-up on 08/12/23. At that time, PEG tube placement had been recommended due to ongoing inadequate PO intake and pt's refusal to eat even when offered multiple times. Per MD notes, pt's mother is currently refusing PEG tube and desires to promote increased PO intake.  Per RN, pt requesting to have Cortrak tube removed and diet advanced to regular food because he does not like the "baby food." RD included SLP in group chat with MD and RN; awaiting response from SLP regarding potential for diet advancement.  RD recommended transitioning to nocturnal tube feeds via Cortrak and completing a 48-hour calorie count prior to Cortrak removal. Per discussion with MD, plan will be to d/c tube feeds altogether during calorie count but keep Cortrak tube in place. RD has d/c Nepro Carb Steady tube feeding orders.  A 48-hour calorie count has been ordered to start today with breakfast meal. RD has hung calorie count envelope on pt's door. An untouched breakfast tray was in pt's room at time of visit. RN reports pt has ordered a different meal tray. Discussed calorie count with both RN and NT. Spoke with pt and reiterated importance of adequate PO intake in order for Cortrak tube to be removed.  RD will follow up tomorrow for Day 1 calorie count results.  Interventions: - 48-hour calorie count - d/c continuous tube feeding orders - Mighty Shake TID with meals, each supplement provides 330 kcals and 9 grams of protein - Diet advancement per SLP - Continue renal MVI daily - Continue 1 packet Juven BID per tube to support wound healing, each packet provides 95 calories, 2.5 grams of protein, and 9.8 grams of carbohydrate - Continue Banatrol TF 60 ml BID per tube - Continue PROSource TF20 60 ml daily per tube   Mertie Clause, MS, RD, LDN Registered Dietitian II Please see AMiON for contact information.

## 2023-08-16 NOTE — Progress Notes (Signed)
Upon arriving on unit from HD pt NG tube clogged- not useable.

## 2023-08-16 NOTE — Progress Notes (Signed)
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  Pt with resp failure on 10/21 resulting in intubation and transfer to [facility]; extubated 10/24.  CRRT started 10/22-stopped 10/30.  PMH positive for ESRD on HD, IDDM, gastroparesis, L AKA, HTN. (Simultaneous filing. User may not have seen previous data.)   OT comments  Pt with good progress toward established OT goals this session. Working on following commands, awareness, sitting balance EOB, and LUE PROM. Pt needing max cues to sustain attention to tasks and commands this session. Needing simple step by step commands this session push/pull/lean into my shoulder,etc. Max multimodal cues to attend to L side of body and environment. Will continue to follow.       If plan is discharge home, recommend the following:  Two people to help with walking and/or transfers;Two people to help with bathing/dressing/bathroom;Assistance with cooking/housework;Assistance with feeding;Help with stairs or ramp for entrance;Assist for transportation;Direct supervision/assist for financial management;Direct supervision/assist for medications management   Equipment Recommendations  Other (comment) (defer)    Recommendations for Other Services      Precautions / Restrictions Precautions Precautions: Fall (Simultaneous filing. User may not have seen previous data.) Precaution Comments: LAKA, L hemiparesis, R wrist and ankle restraint, flexiseal, coretrak, blood clot LUE (Simultaneous filing. User may not have seen previous data.) Restrictions Weight Bearing Restrictions: Yes (Simultaneous filing. User may not have seen previous data.) LLE Weight Bearing: Non  weight bearing (Simultaneous filing. User may not have seen previous data.)       Mobility Bed Mobility                    Transfers                         Balance                                           ADL either performed or assessed with clinical judgement   ADL Overall ADL's : Needs assistance/impaired     Grooming: Moderate assistance;Sitting Grooming Details (indicate cue type and reason): poor ability to sustain attention             Lower Body Dressing: Total assistance Lower Body Dressing Details (indicate cue type and reason): to don R sock                    Extremity/Trunk Assessment Upper Extremity Assessment Upper Extremity Assessment: Left hand dominant;LUE deficits/detail LUE Deficits / Details: No active movement; unable to detect stimuli poor awareness of arm in space and no awareness or attempts to protect during mobility   Lower Extremity Assessment Lower Extremity Assessment: Defer to PT evaluation        Vision   Vision Assessment?: No apparent visual deficits   Perception     Praxis      Cognition  General Comments: Pt verbose and internally distracted throughout. Needing max cues to sustain tasks for >30 second bouts; significantly increased time to process and then follow commmands. Cues for attention to L side during session. (Simultaneous filing. User may not have seen previous data.)        Exercises Exercises: Other exercises Other Exercises Other Exercises: PROM LUE shoulder/scapula/elbow /wrist hand 15x ea joint/movement. pt with limited awareness OT ranging hand/arm despite cues to attend Other Exercises: Worked on sitting balance/core control EOB. Pt meeding from mAx A to min A by end of session. Splint donned at end of session. Re-reviewed care and wear schedule with nursing staff.    Shoulder Instructions        General Comments VSS    Pertinent Vitals/ Pain          Home Living                                          Prior Functioning/Environment              Frequency  Min 2X/week        Progress Toward Goals  OT Goals(current goals can now be found in the care plan section)  Progress towards OT goals: Progressing toward goals  Acute Rehab OT Goals Patient Stated Goal: eat normal food OT Goal Formulation: With patient/family Time For Goal Achievement: 08/16/23 Potential to Achieve Goals: Good ADL Goals Pt Will Perform Grooming: with min assist;sitting Pt Will Perform Lower Body Bathing: with mod assist;sit to/from stand Pt Will Perform Lower Body Dressing: sitting/lateral leans;with max assist Pt Will Transfer to Toilet: with max assist;with transfer board;bedside commode Pt Will Perform Toileting - Clothing Manipulation and hygiene: with mod assist;sitting/lateral leans Pt/caregiver will Perform Home Exercise Program: Increased ROM;Left upper extremity Additional ADL Goal #1: Pt will follow all one step commands with no more than min cues Additional ADL Goal #2: Pt will tolerate LUE ROM and wear schedule for L resting hand splint.  Plan      Co-evaluation    PT/OT/SLP Co-Evaluation/Treatment: Yes Reason for Co-Treatment: Necessary to address cognition/behavior during functional activity;For patient/therapist safety;To address functional/ADL transfers (Simultaneous filing. User may not have seen previous data.) PT goals addressed during session: Mobility/safety with mobility;Balance (Simultaneous filing. User may not have seen previous data.) OT goals addressed during session: Strengthening/ROM      AM-PAC OT "6 Clicks" Daily Activity     Outcome Measure   Help from another person eating meals?: A Lot Help from another person taking care of personal grooming?: A Lot Help from another person toileting, which includes using toliet, bedpan, or  urinal?: Total Help from another person bathing (including washing, rinsing, drying)?: Total Help from another person to put on and taking off regular upper body clothing?: Total Help from another person to put on and taking off regular lower body clothing?: Total 6 Click Score: 8    End of Session    OT Visit Diagnosis: Other abnormalities of gait and mobility (R26.89);Other symptoms and signs involving the nervous system (R29.898);Hemiplegia and hemiparesis;Muscle weakness (generalized) (M62.81);Other symptoms and signs involving cognitive function Hemiplegia - caused by: Cerebral infarction   Activity Tolerance Patient tolerated treatment well   Patient Left in bed;with call bell/phone within reach;with bed alarm set   Nurse Communication Mobility status;Precautions        Time: 4098-1191 OT Time Calculation (min):  45 min  Charges: OT General Charges $OT Visit: 1 Visit OT Treatments $Self Care/Home Management : 8-22 mins  Tyler Deis, OTR/L [facility] Acute Rehabilitation Office: [phone]   [name] 08/16/2023, 2:39 PM

## 2023-08-17 ENCOUNTER — Telehealth: Payer: Self-pay | Admitting: Cardiovascular Disease

## 2023-08-17 DIAGNOSIS — I059 Rheumatic mitral valve disease, unspecified: Secondary | ICD-10-CM | POA: Diagnosis not present

## 2023-08-17 LAB — PHOSPHORUS: Phosphorus: 4.3 mg/dL (ref 2.5–4.6)

## 2023-08-17 LAB — MAGNESIUM: Magnesium: 2.5 mg/dL — ABNORMAL HIGH (ref 1.7–2.4)

## 2023-08-17 LAB — CBC WITH DIFFERENTIAL/PLATELET
Abs Immature Granulocytes: 0.06 10*3/uL (ref 0.00–0.07)
Basophils Absolute: 0.1 10*3/uL (ref 0.0–0.1)
Basophils Relative: 1 %
Eosinophils Absolute: 0.3 10*3/uL (ref 0.0–0.5)
Eosinophils Relative: 3 %
HCT: 32.5 % — ABNORMAL LOW (ref 39.0–52.0)
Hemoglobin: 10.3 g/dL — ABNORMAL LOW (ref 13.0–17.0)
Immature Granulocytes: 1 %
Lymphocytes Relative: 8 %
Lymphs Abs: 0.9 10*3/uL (ref 0.7–4.0)
MCH: 29.6 pg (ref 26.0–34.0)
MCHC: 31.7 g/dL (ref 30.0–36.0)
MCV: 93.4 fL (ref 80.0–100.0)
Monocytes Absolute: 0.4 10*3/uL (ref 0.1–1.0)
Monocytes Relative: 4 %
Neutro Abs: 8.8 10*3/uL — ABNORMAL HIGH (ref 1.7–7.7)
Neutrophils Relative %: 83 %
Platelets: 350 10*3/uL (ref 150–400)
RBC: 3.48 MIL/uL — ABNORMAL LOW (ref 4.22–5.81)
RDW: 17.2 % — ABNORMAL HIGH (ref 11.5–15.5)
WBC: 10.5 10*3/uL (ref 4.0–10.5)
nRBC: 0 % (ref 0.0–0.2)

## 2023-08-17 LAB — COMPREHENSIVE METABOLIC PANEL
ALT: 10 U/L (ref 0–44)
AST: 37 U/L (ref 15–41)
Albumin: 2.6 g/dL — ABNORMAL LOW (ref 3.5–5.0)
Alkaline Phosphatase: 136 U/L — ABNORMAL HIGH (ref 38–126)
Anion gap: 12 (ref 5–15)
BUN: 64 mg/dL — ABNORMAL HIGH (ref 6–20)
CO2: 24 mmol/L (ref 22–32)
Calcium: 9 mg/dL (ref 8.9–10.3)
Chloride: 90 mmol/L — ABNORMAL LOW (ref 98–111)
Creatinine, Ser: 4.54 mg/dL — ABNORMAL HIGH (ref 0.61–1.24)
GFR, Estimated: 16 mL/min — ABNORMAL LOW (ref >60)
Glucose, Bld: 161 mg/dL — ABNORMAL HIGH (ref 70–99)
Potassium: 4.5 mmol/L (ref 3.5–5.1)
Sodium: 126 mmol/L — ABNORMAL LOW (ref 135–145)
Total Bilirubin: 0.9 mg/dL (ref <1.2)
Total Protein: 8.2 g/dL — ABNORMAL HIGH (ref 6.5–8.1)

## 2023-08-17 LAB — GLUCOSE, CAPILLARY
Glucose-Capillary: 123 mg/dL — ABNORMAL HIGH (ref 70–99)
Glucose-Capillary: 144 mg/dL — ABNORMAL HIGH (ref 70–99)
Glucose-Capillary: 146 mg/dL — ABNORMAL HIGH (ref 70–99)
Glucose-Capillary: 157 mg/dL — ABNORMAL HIGH (ref 70–99)
Glucose-Capillary: 174 mg/dL — ABNORMAL HIGH (ref 70–99)
Glucose-Capillary: 180 mg/dL — ABNORMAL HIGH (ref 70–99)
Glucose-Capillary: 58 mg/dL — ABNORMAL LOW (ref 70–99)
Glucose-Capillary: 80 mg/dL (ref 70–99)
Glucose-Capillary: >600 mg/dL (ref 70–99)

## 2023-08-17 LAB — HEPATITIS B SURFACE ANTIBODY, QUANTITATIVE: Hep B S AB Quant (Post): 833 m[IU]/mL

## 2023-08-17 MED ORDER — CHLORHEXIDINE GLUCONATE CLOTH 2 % EX PADS
6.0000 | MEDICATED_PAD | Freq: Every day | CUTANEOUS | Status: DC
  Administered 2023-08-17 – 2023-08-20 (×4): 6 via TOPICAL

## 2023-08-17 MED ORDER — NEPRO/CARBSTEADY PO LIQD
1000.0000 mL | ORAL | Status: DC
  Administered 2023-08-17: 1000 mL
  Filled 2023-08-17 (×2): qty 1000

## 2023-08-17 MED ORDER — DEXTROSE 50 % IV SOLN
INTRAVENOUS | Status: AC
  Administered 2023-08-17: 12.5 g via INTRAVENOUS
  Filled 2023-08-17: qty 50

## 2023-08-17 MED ORDER — DEXTROSE 50 % IV SOLN: 12.5000 g | INTRAVENOUS | Status: AC

## 2023-08-17 NOTE — Inpatient Diabetes Management (Signed)
Inpatient Diabetes Program Recommendations  AACE/ADA: New Consensus Statement on Inpatient Glycemic Control (2015)  Target Ranges:  Prepandial:   less than 140 mg/dL      Peak postprandial:   less than 180 mg/dL (1-2 hours)      Critically ill patients:  140 - 180 mg/dL   Lab Results  Component Value Date   GLUCAP 180 (H) 08/17/2023   HGBA1C 8.9 (H) 07/10/2023    Review of Glycemic Control  Latest Reference Range & Units 08/17/23 04:35 08/17/23 04:57 08/17/23 07:41 08/17/23 11:29  Glucose-Capillary 70 - 99 mg/dL 58 (L) 161 (H) 096 (H) 180 (H)   Diabetes history: DM 1 Outpatient Diabetes medications:  Outpatient Diabetes medications: Novolin 70/30 10 units BID, Novolin R 12 units TID Current orders for Inpatient glycemic control: Novolog 0-9 units TID & HS Novolog 3 units TID Semglee 15 units every day  Inpatient Diabetes Program Recommendations:    Please consider reducing Semglee to 12 units daily and reduce Novolog correction to very sensitive (0-6 units) tid with meals and HS.    Thanks,  Beryl Meager, RN, BC-ADM Inpatient Diabetes Coordinator Pager [phone] (8a-5p)

## 2023-08-17 NOTE — Progress Notes (Signed)
[facility] PROGRESS NOTE    Progress Note  Shane BALANDRAN  [name]:[id number] DOB: November 03, 1980 DOA: 07/09/2023 PCP: [name], NP     Brief Narrative:   [name] is an 42 y.o. male past medical history of end-stage renal disease on hemodialysis Monday Wednesday Friday, insulin-dependent diabetes mellitus, gastroparesis comes into close is: 07/08/2022 with altered mental status, she was recently seen in the the hospital on 04/09/2023 for sepsis with diabetic foot cultures grew MRSA during that time patient had catheter removed and replaced ID was consulted who recommended IV vancomycin.  Patient refused echo and left AGAINST MEDICAL ADVICE on 04/14/2023 was found confused by family members on 07/09/2023 brought into the ED and found to be encephalopathic with agitation, her blood glucose was 500 started on IV fluids and IV insulin for DKA CT of the head showed no acute findings.  CT scan of the abdomen and pelvis showed no acute findings but it did show hepatic steatosis, with a liver abnormality 7 x 4 cm septic with a white count of 20 was fluid resuscitated and started empirically on IV Vanco and Zosyn also.  Her blood pressure became soft PCCM was consulted for an ICU admission as she became hypoxic and worsen over night on 07/19/2023.  Had to be intubated eventually extubated.    Significant Events: 10/12: altered mental status, DKA 10/12: blood cultures positive for MRSA 10/13 TTE LVEF 25-30% w/ severely depressed LVF, global hypokinesis, gd I diastolic dysfxn, mod RV fxn reduction. Possible filamentous structure on MV 10/16 TEE LVEF 30-35%RV fxn nml. Large mass (2.00 cm x 0.88 cm ) on the posterior mitral leaflet which appeared to be extending in the mitral annulus. There are two mobile masses visible on the anterior leaflet of the tricupid valve which also appears to have a perforation. Another mobile  mass ( 0.68 cm x 0.67 cm) on the posterior tricupid leaflet. Moderate  Tricuspid regurgitation which appears to  be through the leaflet perforation  10/16 seen by Cardiothoracic surg. Felt poor candidate. Recommended 6 weeks abx and recommended repeat TEE 1 week 10/17 noting left arm weakness. Not clear when started.  MRI showed Right MCA infarct w/ acute infarcts in left occipital and bilateral cerebellum felt c/w septic emboli. Neuro consulted.  10/18 tunneled left femoral vasc cath placed after catheter holiday  10/20 palliative care consulted later that day worsening hypoxia. Sats 70s. After vomiting. More lethargic  10/21: resp failure requiring intubation shortly after midnight  10/22- TEE 10/22 with EF 35%, LV global hypokinesis. Very large vegetation lateral scallop of posterior mitral leaflet 3.9cm length 1 cm width, highly mobile, prolapsing into LV. No large vegetation seen on TV.  Later that afternoon and abrupt hemodynamic changes requiring escalation of vasopressor support.  Felt given the size of his vegetation and increased bulk of the vegetation infections unlikely to clear.  He was felt to be decompensation may have due to of breath leaflet perforation and acute mitral valve regurgb 10/23: per nursing less responsive than yesterday, increased ETT secretions. Clotting off CRRT. Ct head showing expected evolution of the right MCA stroke.  Cardiothoracic surgery re-consulted for worsening MVE. Felt too high risk here. Recommended referral to tertiary care  10/24 agitated when sedation decreased, is purposeful but does not follow commands. Tolerating UF gaols of -150. Still on NE infusion but extubated later that afternoon after passing SBT 10/25 failed swallow eval still on just 3 lpm O2. Encephalopathic. Added seroquel in effort to get  him off the precedex. Cardiology told no beds at [facility]. Not clear if accepted or not. Worsening pressor requirements. Got blood for hgb 6. Cefepime resumed for clinical decline and leukocytosis  10/26 Hemoglobin again dropped to  6.3 overnight with increased pressor requirement yesterday afternoon now on vaso and levo. 10/27 Hypoglycemic episode overnight, mental status improved a little over weekend  10/28 Getting another unit of blood more awake.  Oriented x 2.  Still on Precedex.  Having liquid stools. 10/30 Off Precedex, low-dose levo.  Plan to transition to IHD 10/29 Cefepime for aspiration course completed.  10/31 Seen by overnight team for hypoxia and somnolence ABG with mild hypercapnia. Again declined by [facility] because they were at capacity. More awake. Doing better w/ Bed side swallow but still likely aspirating. CRRT stopped.  11/1 Adjustments made for better glycemic control.  Much more awake but a little confused 11/4 Remains on low dose levo 11/5; weaned off pressors and transferred to progressive care unit Patient was extubated, transferred to progressive care unit under [facility] service on 08/04/2023  11/7-called [facility], at capacity.  Declined transfer 11/12-femoral line removed; palliative care consulted for goals of care 11/12-called [facility] for transfer, declined due to capacity 11/13 no changes. Will pursue LTACH options 11/14 no changes. Calmer today. Pt's mother, [name], would like to cont full aggressive care.  11/15 discussed possibility of PEG tube with patient's mother at this time she like to continue to increase oral feeds.   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 He is currently on midodrine with dialysis his blood pressure is holding up. Has defervesced leukocytosis improved. Has been denied for surgical intervention by [facility] surgery, [facility], and [facility]. Will need 6 weeks of antibiotics starting from 08/09/2023. Discontinue rectal tube. If he tolerates his diet can probably discontinue NG tube, nutrition has been consulted for calorie count.  Evolving cavitary  pneumonia with mucous plugging: On IV vancomycin per ID recommendations. Has been weaned to room air. Probably due to tricuspid endocarditis  Acute on chronic combined systolic and diastolic heart failure/possible cardiogenic shock: 2D echo showed an EF of 30% and vegetation tricuspid and mitral valve with severe regurgitation. Cont metoprolol,  tachycardic is improving blood pressure is holding up.   Will probably require surgical intervention of valves, CT surgery was consulted recommended transfer to tertiary center due to high risk of patient procedure. [facility] has declined the patient transferred time to due to capacity, [facility] denied for multiple prohibitive surgical risk. Last time [facility] was called was on 08/10/2023 and they declined due to capacity.  Ongoing sinus tachycardia: Has improved after initiating metoprolol on 08/11/2023, IV Dilaudid was decreased and his blood pressure is trending up. Metoprolol was increased tachycardia is improving. Try to keep potassium greater than 4 magnesium greater than 2. Currently on oral midodrine.  Lower extremity DVT: Positive on 08/02/2023, now on apixaban.  Acute metabolic encephalopathy with delirium and agitation/right MCA CVA: MRI of the brain on 07/15/2023 showed an M2 occlusion found to have a right MCA territory infarct favored septic emboli. Currently on restraints. Avoid Klonopin, use Seroquel at bedtime Haldol IV as needed. Was able to participate with PT OT will need LTAC.  End-stage renal disease on hemodialysis Monday Wednesday Friday: Nephrology following, he was successfully transferred to [facility] on 08/11/2023 Status post line holiday without catheter removal on 07/14/2023. IR placed tunneled catheter on 07/16/2023.  Lytes and volume status per nephrology.  Dysphagia: Tube feedings via core track, now on dysphagia 1 diet. Patient is mentally more awake, continue dysphagia diet. If he is able to tolerate his diet can  discontinue core track today. The previous physician had extensive discussion with patient and family on 11/15 and then again 11/16. Refuse PEG tube. Once we stop tube feedings we could discontinue rectal tube. Stop tube feedings.  Hyperglycemia/DKA/diabetes mellitus type 2: Continue CBGs Q4 and sliding scale insulin.  Anemia/thrombocytopenia due to critical illness: Status post 5 units of packed red blood cells, hemoglobin has remained relatively stable.  Incidental liver mass: Seen on CT scan of the abdomen pelvis 7 x 4 cm anterior liver mass. Will need MRI as an outpatient.  Hyponatremia: Further management per nephrology.  Goals of care: Overall poor prognosis, unfortunately patient remain high risk of decompensating at this time.  Extensive discussion has been had with mother on 08/14/2023 and 08/13/2023 about prognosis. We have explained to her he has multiorgan failure and they have consulted palliative care. During that time the mother wishes to proceed with full aggressive treatment.  Unfortunately the patient does not have a consistent source of nutrition. [name] (mother wishes to continue oral intake) Attempts were made to place patient in LTAC however was declined due to lack of consistent nutrition. LTAC reports that if were able to place a PEG tube then we can reapply for LTAC placement. TOC is informed. Has been declining for transfer to [facility]  Stage IIc decubitus ulcer: RN Pressure Injury Documentation: Pressure Injury 08/03/23 Buttocks Left Stage 2 -  Partial thickness loss of dermis presenting as a shallow open injury with a red, pink wound bed without slough. Several small open areas, right buttocks area (Active)  08/03/23 1945  Location: Buttocks  Location Orientation: Left  Staging: Stage 2 -  Partial thickness loss of dermis presenting as a shallow open injury with a red, pink wound bed without slough.  Wound Description (Comments):  Several small open areas, right buttocks area  Present on Admission: No  Dressing Type Foam - Lift dressing to assess site every shift 08/16/23 0930    DVT prophylaxis: lovenox Family Communication:Mother Status is: Inpatient Remains inpatient appropriate because: Septic shock    Code Status:     Code Status Orders  (From admission, onward)           Start     Ordered   07/10/23 0130  Full code  Continuous       Question:  By:  Answer:  Consent: discussion documented in EHR   07/10/23 0131           Code Status History     Date Active Date Inactive Code Status Order ID Comments User Context   07/10/2023 0119 07/10/2023 0131 Full Code [id number]  Patrici Ranks, MD ED   04/09/2023 2237 04/15/2023 0003 Full Code [id number]  Briscoe Deutscher, MD ED   08/09/2021 1156 08/12/2021 0242 Full Code [id number]  Dellia Cloud, MD ED   05/19/2021 2107 05/23/2021 2342 Full Code [id number]  Hillary Bow, DO ED   10/14/2020 1357 10/22/2020 0740 Full Code [id number]  Emeline General, MD Inpatient   09/11/2020 0424 10/12/2020 0833 Full Code [id number]  Chotiner, Claudean Severance, MD ED   07/22/2020 0535 07/22/2020 2355 Full Code [id number]  Delano Metz, MD Inpatient   07/22/2020 0525 07/22/2020 0535 Full Code [id number]  Charlotte Sanes, MD ED   05/31/2020  0030 05/31/2020 0621 Full Code [id number]  Fran Lowes, DO ED   02/14/2020 1159 02/18/2020 1935 Full Code [id number]  Erick Blinks, MD ED   01/31/2020 0317 02/01/2020 1645 Full Code [id number]  Eduard Clos, MD ED   04/24/2019 1358 04/27/2019 2047 Full Code [id number]  Lorenso Courier, MD ED   10/26/2018 1950 11/03/2018 1944 Full Code [id number]  Haydee Monica, MD ED   10/20/2018 1730 10/25/2018 2038 Full Code [id number]  Elease Etienne, MD Inpatient   08/17/2018 2332 08/25/2018 1645 Full Code [id number]  John Giovanni, MD Inpatient   02/01/2018 1446 02/06/2018 0032 Full Code [id number]  Gwynn Burly, DO ED   10/25/2017 2225 11/03/2017 2322 Full Code  [id number]  Clydie Braun, MD ED   11/16/2016 1338 11/16/2016 1649 Full Code [id number]  Sherren Kerns, MD Inpatient   05/27/2015 0707 05/29/2015 1809 Full Code [id number]  Arnaldo Natal Inpatient         IV Access:   Peripheral IV   Procedures and diagnostic studies:   No results found.   Medical Consultants:   None.   Subjective:    [name] no complaints this morning.  Objective:    Vitals:   08/16/23 2129 08/16/23 2300 08/17/23 0200 08/17/23 0459  BP: (!) 157/99 125/83 120/83   Pulse:      Resp: 14 16 18    Temp: 98.1 F (36.7 C) 98.3 F (36.8 C) 98.1 F (36.7 C)   TempSrc: Axillary Oral Oral   SpO2:  98% 95%   Weight: 74.8 kg   75.3 kg  Height:       SpO2: 95 % O2 Flow Rate (L/min): 4 L/min FiO2 (%): 32 %   Intake/Output Summary (Last 24 hours) at 08/17/2023 0732 Last data filed at 08/16/2023 1950 Gross per 24 hour  Intake 60 ml  Output 2500 ml  Net -2440 ml   Filed Weights   08/16/23 0400 08/16/23 2129 08/17/23 0459  Weight: 76.7 kg 74.8 kg 75.3 kg    Exam: General exam: In no acute distress. Respiratory system: Good air movement and clear to auscultation. Cardiovascular system: S1 & S2 heard, RRR. No JVD. Gastrointestinal system: Abdomen is nondistended, soft and nontender.  Extremities: No pedal edema. Skin: No rashes, lesions or ulcers Psychiatry: Judgement and insight appear normal. Mood & affect appropriate.   Data Reviewed:    Labs: Basic Metabolic Panel: Recent Labs  Lab 08/11/23 0906 08/12/23 0236 08/13/23 0800 08/14/23 0224 08/15/23 0219 08/16/23 1615  NA 128*  --  128* 131* 126* 127*  K 4.2  --  4.9 4.0 4.4 4.7  CL 90*  --  92* 92* 89* 89*  CO2 27  --  23 27 25 26   GLUCOSE 191*  --  316* 250* 340* 89  BUN 65*  --  80* 47* 80* 131*  CREATININE 6.04*  --  6.11* 3.78* 5.37* 7.08*  CALCIUM 9.4  --  9.8 9.5 9.8 9.7  MG  --  2.8* 3.2* 2.5* 2.8* 3.2*  PHOS  --   --  4.8* 3.2 4.2 5.2*   GFR Estimated  Creatinine Clearance: 12.6 mL/min (A) (by C-G formula based on SCr of 7.08 mg/dL (H)). Liver Function Tests: Recent Labs  Lab 08/11/23 0906 08/13/23 0800 08/14/23 0224 08/15/23 0219 08/16/23 1615  AST 21 21 18 18  14*  ALT 14 11 11 11 11   ALKPHOS 173* 154* 143* 178* 136*  BILITOT 0.7 0.6 0.8 0.8 0.8  PROT 6.8 7.4  7.7 8.0 7.1  ALBUMIN 2.0* 2.3* 2.3* 2.5* 2.3*   No results for input(s): "LIPASE", "AMYLASE" in the last 168 hours. No results for input(s): "AMMONIA" in the last 168 hours. Coagulation profile No results for input(s): "INR", "PROTIME" in the last 168 hours. COVID-19 Labs  No results for input(s): "DDIMER", "FERRITIN", "LDH", "CRP" in the last 72 hours.  Lab Results  Component Value Date   SARSCOV2NAA NEGATIVE 07/10/2023   SARSCOV2NAA NEGATIVE 04/09/2023   SARSCOV2NAA NEGATIVE 08/09/2021   SARSCOV2NAA NEGATIVE 05/19/2021    CBC: Recent Labs  Lab 08/11/23 0906 08/13/23 0800 08/14/23 0224 08/15/23 0219 08/16/23 1615  WBC 12.2* 10.8* 10.1 11.3* 10.0  NEUTROABS 9.7* 7.8* 7.8* 8.4* 7.2  HGB 7.5* 8.4* 8.6* 9.9* 8.7*  HCT 24.6* 27.6* 28.1* 32.1* 27.7*  MCV 96.1 95.8 93.7 93.9 93.9  PLT 294 323 355 380 345   Cardiac Enzymes: No results for input(s): "CKTOTAL", "CKMB", "CKMBINDEX", "TROPONINI" in the last 168 hours. BNP (last 3 results) No results for input(s): "PROBNP" in the last 8760 hours. CBG: Recent Labs  Lab 08/16/23 2223 08/17/23 0023 08/17/23 0433 08/17/23 0435 08/17/23 0457  GLUCAP 85 80 >600* 58* 157*   D-Dimer: No results for input(s): "DDIMER" in the last 72 hours. Hgb A1c: No results for input(s): "HGBA1C" in the last 72 hours. Lipid Profile: No results for input(s): "CHOL", "HDL", "LDLCALC", "TRIG", "CHOLHDL", "LDLDIRECT" in the last 72 hours. Thyroid function studies: No results for input(s): "TSH", "T4TOTAL", "T3FREE", "THYROIDAB" in the last 72 hours.  Invalid input(s): "FREET3" Anemia work up: No results for input(s):  "VITAMINB12", "FOLATE", "FERRITIN", "TIBC", "IRON", "RETICCTPCT" in the last 72 hours. Sepsis Labs: Recent Labs  Lab 08/13/23 0800 08/14/23 0224 08/15/23 0219 08/16/23 1615  WBC 10.8* 10.1 11.3* 10.0   Microbiology No results found for this or any previous visit (from the past 240 hour(s)).   Medications:    apixaban  5 mg Oral BID   calcitRIOL  1.5 mcg Oral Q M,W,F   Chlorhexidine Gluconate Cloth  6 each Topical Q0600   Chlorhexidine Gluconate Cloth  6 each Topical Q0600   darbepoetin (ARANESP) injection - DIALYSIS  100 mcg Subcutaneous Q Fri-1800   feeding supplement (PROSource TF20)  60 mL Per Tube Daily   fiber supplement (BANATROL TF)  60 mL Per Tube BID   gabapentin  300 mg Oral QHS   insulin aspart  0-5 Units Subcutaneous QHS   insulin aspart  0-9 Units Subcutaneous TID WC   insulin aspart  3 Units Subcutaneous TID WC   insulin glargine-yfgn  15 Units Subcutaneous Daily   metoprolol tartrate  50 mg Oral BID   midodrine  10 mg Oral Q M,W,F-HD   multivitamin  1 tablet Oral QHS   nutrition supplement (JUVEN)  1 packet Per Tube BID BM   mouth rinse  15 mL Mouth Rinse 4 times per day   QUEtiapine  50 mg Oral Q0600   Continuous Infusions:  vancomycin 750 mg (08/16/23 2215)      LOS: 38 days   Marinda Elk  [facility]  08/17/2023, 7:32 AM

## 2023-08-17 NOTE — Progress Notes (Addendum)
Calorie Count Note: Day 1 Results  48-hour calorie count ordered. Please see day 1 results below.  Diet: Regular, nectar-thick liquids Supplements:  - Mighty Shake TID with meals, each supplement provides 330 kcals and 9 grams of protein  08/16/23: Breakfast: 459 kcal, 24 grams of protein Lunch: 102 kcal, 4 grams of protein Dinner: 0 kcal, 0 grams of protein  Total 24-hour intake: 561 kcal (26% of minimum estimated needs)  28 grams of protein (23% of minimum estimated needs)  Nutrition Diagnosis: Severe Malnutrition related to chronic illness as evidenced by severe fat depletion, severe muscle depletion.  Goal: Patient will meet greater than or equal to 90% of their needs.  Intervention:  - Continue calorie count - Mighty Shake TID with meals, each supplement provides 330 kcals and 9 grams of protein - Continue renal MVI daily - Continue 1 packet Juven BID per tube to support wound healing, each packet provides 95 calories, 2.5 grams of protein, and 9.8 grams of carbohydrate - Continue Banatrol TF 60 ml BID per tube - Recommend initiating nocturnal tube feeds via Cortrak tube to support PO intake as pt is meeting <50% of his needs via PO intake alone. Recommend: Nepro Carb Steady @ 65 ml/hr x 12 hours from 1800 to 0600 (total of 780 ml nightly) with PROSource TF20 60 ml daily. This would provide 1460 kcal, 83 grams of protein, and 567 ml of H2O.  Reached out to MD regarding concerns and recommendation to initiate nocturnal tube feeds via Cortrak. Awaiting response.  ADDENDUM ([id number]): Discussed pt with RN. MD would like to proceed with nocturnal feedings. RD to order the above recommendations.   Mertie Clause, MS, RD, LDN Registered Dietitian II Please see AMiON for contact information.

## 2023-08-17 NOTE — Progress Notes (Signed)
Physical Therapy Treatment Patient Details Name: [name] MRN: [id number] DOB: 1981/08/17 Today's Date: 08/17/2023   History of Present Illness Patient is a 42 y/o male admitted 07/09/23 with confusion found to be in septic shock found to have MRSA in blood cultures, in DKA, TEE positive for vegitation on multiple valves, MRI showing R MCA infarct.  Pt with resp failure on 10/21 resulting in intubation and transfer to [facility]; extubated 10/24.  CRRT started 10/22-stopped 10/30.  PMH positive for ESRD on HD, IDDM, gastroparesis, L AKA, HTN.    PT Comments  Pt with increased confusion, irritation, and no recall of yesterday's therapy session. Pt resistant to all assistance to attempt transfer to EOB. Pt also began slapping rehab tech and PT stating "I told you all to stop hitting me. I told you yesterday to stop hitting me." This therapy tried to reorient  patient to current date/time/place and help pt recall yesterday's very successful 45 min PT/OT session however pt with no recall and continued irritation. Acute PT to return as able to progress mobility.   If plan is discharge home, recommend the following: Two people to help with walking and/or transfers;Assist for transportation;Supervision due to cognitive status;Help with stairs or ramp for entrance   Can travel by private vehicle     No  Equipment Recommendations  Hoyer lift;Wheelchair (measurements PT);Wheelchair cushion (measurements PT)    Recommendations for Other Services       Precautions / Restrictions Precautions Precautions: Fall Precaution Comments: LAKA, L hemiparesis, R wrist and ankle restraint, flexiseal, coretrak, blood clot LUE Restrictions Weight Bearing Restrictions: Yes LLE Weight Bearing: Non weight bearing     Mobility  Bed Mobility Overal bed mobility: Needs Assistance Bed Mobility: Rolling, Sidelying to Sit, Sit to Sidelying Rolling: Max assist Sidelying to sit: +2 for physical assistance, Total  assist   Sit to supine: Total assist, +2 for physical assistance   General bed mobility comments: despite max verbal and tactile cues to use R UE to aide in rolling and pushing trunk up from bed level pt wit no assist and pulling R UE away from therapist to not assist stating "stop slapping me"    Transfers                   General transfer comment: unable to attempt this date    Ambulation/Gait               General Gait Details: unable   Stairs             Wheelchair Mobility     Tilt Bed    Modified Rankin (Stroke Patients Only) Modified Rankin (Stroke Patients Only) Pre-Morbid Rankin Score: Moderate disability Modified Rankin: Severe disability     Balance       Sitting balance - Comments: unable to achieve safe full upright posture this date, pt with strong L lateral lean and resistant to sitting all the way up to midline                                    Cognition Arousal: Alert Behavior During Therapy:  (irritated, confused) Overall Cognitive Status: Impaired/Different from baseline Area of Impairment: Orientation, Attention, Memory, Following commands, Safety/judgement, Awareness, Problem solving                   Current Attention Level: Focused Memory: Decreased short-term memory Following Commands:  (  not following commands today out of resistance in addition to impaired comprehension) Safety/Judgement: Decreased awareness of safety, Decreased awareness of deficits   Problem Solving: Slow processing, Decreased initiation, Difficulty sequencing, Requires verbal cues, Requires tactile cues General Comments: pt with no recollection of therapy session yesterday and irritated with this PT and tech slapping [place] with his R UE stating "I told you all yesterday to stop hitting me. Stop." This PT attempted to re-orient patient to situation and recall converstaion from yesterdays session however pt with no recall stating  "You know what we talked about. Don't say you don't." pt non-cooperative with noted confusion and irritation        Exercises      General Comments General comments (skin integrity, edema, etc.): VSS      Pertinent Vitals/Pain Pain Assessment Pain Assessment: Faces Faces Pain Scale: No hurt    Home Living                          Prior Function            PT Goals (current goals can now be found in the care plan section) Acute Rehab PT Goals PT Goal Formulation: Patient unable to participate in goal setting Time For Goal Achievement: 08/25/23 Potential to Achieve Goals: Fair Progress towards PT goals: Not progressing toward goals - comment (pt with worsening confusion with inabiltiy to reacall yesterdays therapy siession)    Frequency    Min 1X/week      PT Plan      Co-evaluation              AM-PAC PT "6 Clicks" Mobility   Outcome Measure  Help needed turning from your back to your side while in a flat bed without using bedrails?: Total Help needed moving from lying on your back to sitting on the side of a flat bed without using bedrails?: Total Help needed moving to and from a bed to a chair (including a wheelchair)?: Total Help needed standing up from a chair using your arms (e.g., wheelchair or bedside chair)?: Total Help needed to walk in hospital room?: Total Help needed climbing 3-5 steps with a railing? : Total 6 Click Score: 6    End of Session   Activity Tolerance: Other (comment) (limited by impaired cognition/AMS) Patient left: in bed;with call bell/phone within reach;with bed alarm set;with nursing/sitter in room Nurse Communication: Mobility status PT Visit Diagnosis: Other abnormalities of gait and mobility (R26.89);Other symptoms and signs involving the nervous system (R29.898);Hemiplegia and hemiparesis Hemiplegia - Right/Left: Left Hemiplegia - dominant/non-dominant: Non-dominant Hemiplegia - caused by: Cerebral  infarction     Time: 1021-1039 PT Time Calculation (min) (ACUTE ONLY): 18 min  Charges:    $Therapeutic Activity: 8-22 mins PT General Charges $$ ACUTE PT VISIT: 1 Visit                     Lewis Shock, PT, DPT Acute Rehabilitation Services Secure chat preferred Office #: [phone]    Iona Hansen 08/17/2023, 11:24 AM

## 2023-08-17 NOTE — Progress Notes (Signed)
[facility] Progress Note  Subjective: seen in room, no c/o's  Vitals:   08/17/23 0200 08/17/23 0459 08/17/23 0743 08/17/23 1001  BP: 120/83  (!) 140/66 (!) 140/66  Pulse:    (!) 110  Resp: 18  20   Temp: 98.1 F (36.7 C)  98.2 F (36.8 C)   TempSrc: Oral  Oral   SpO2: 95%  95%   Weight:  75.3 kg    Height:        Exam: Gen: alert, NG tube on  CVS: Regular tachycardia, S1 and S2 Resp: Transmitted breath sounds, no distinct rales or rhonchi, Agency O2 Abd: Soft, flat, nontender, bowel sounds normal Ext: Status post left AKA  LUE edema, no other edema Access: Left femoral TDC in place    Renal-related home meds: - sensipar 120 at bedtime - gabapentin 100mg  - renvavite - renvela 3 ac tid    OP HD: MWF [facility] 4h  400/1.5    77kg   2/3 bath  [facility] TDC    Heparin none - last OP HD 10/7, post wt 74.6kg - misses HD once every other week approx - rocaltrol 1.50 mcg    Assessment/ Plan: MRSA bacteremia w/ MV endocarditis: due to HD cath infection. SP line holiday w/ old TDC removed 10/16 and new TDC placed 10/18 by IR.  Needs IV vanc for 6 wks thru 09/20/23.  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  ESRD: on HD MWF. SP CRRT 10/22- 10/30, back on iHD now. HD Wed.  Volume:  2L O2 dc'd on 11/10, remains on RA. Euvolemic on exam except LUE edema. New dry wt maybe 73- 75kg.  Anemia: Hb 7- 9 range. ESA dose increased to 100 mcg q. weekly. 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 Monitor on renal diet.  On calcitriol for PTH control. Nutrition: po intake improving, he is eating more and may be removing his NG tube soon.  Hypotension: Weaned off of pressors and weaned off midodrine HD access: SP line holiday with new L  femoral TDC placed on 10/18. 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 Dispo: he successfully dialyzed in a chair yesterday, 11/18. Will not need to  do this again. Ok for dc to SNF.      Vinson Moselle MD  [facility] 08/17/2023, 10:09 AM  Recent Labs  Lab 08/15/23 0219 08/16/23 1615  HGB 9.9* 8.7*  ALBUMIN 2.5* 2.3*  CALCIUM 9.8 9.7  PHOS 4.2 5.2*  CREATININE 5.37* 7.08*  K 4.4 4.7   No results for input(s): "IRON", "TIBC", "FERRITIN" in the last 168 hours. Inpatient medications:  apixaban  5 mg Oral BID   calcitRIOL  1.5 mcg Oral Q M,W,F   Chlorhexidine Gluconate Cloth  6 each Topical Q0600   Chlorhexidine Gluconate Cloth  6 each Topical Q0600   darbepoetin (ARANESP) injection - DIALYSIS  100 mcg Subcutaneous Q Fri-1800   feeding supplement (PROSource TF20)  60 mL Per Tube Daily   fiber supplement (BANATROL TF)  60 mL Per Tube BID   gabapentin  300 mg Oral QHS   insulin aspart  0-5 Units Subcutaneous QHS   insulin aspart  0-9 Units Subcutaneous TID WC   insulin aspart  3 Units Subcutaneous TID WC   insulin glargine-yfgn  15 Units Subcutaneous Daily   metoprolol tartrate  50 mg Oral BID   midodrine  10 mg Oral Q M,W,F-HD  multivitamin  1 tablet Oral QHS   nutrition supplement (JUVEN)  1 packet Per Tube BID BM   mouth rinse  15 mL Mouth Rinse 4 times per day   QUEtiapine  50 mg Oral Q0600    vancomycin 750 mg (08/16/23 2215)   acetaminophen (TYLENOL) oral liquid 160 mg/5 mL, albuterol, Gerhardt's butt cream, haloperidol lactate, heparin sodium (porcine), oxyCODONE **OR** HYDROmorphone (DILAUDID) injection, lip balm, loperamide HCl, mouth rinse

## 2023-08-17 NOTE — Consult Note (Signed)
WOC Nurse Consult Note: Consult requested for left foot.  Pt has a left AKA. Checked with the nurse via secure chat and there are no wounds to the left stump.  WOC consult was performed for right foot yesterday; refer to consult notes, and topical treatment orders have been provided for the nurses.  Please re-consult if further assistance is needed.  Thank-you,  Cammie Mcgee MSN, RN, CWOCN, [place], CNS [phone]

## 2023-08-17 NOTE — Progress Notes (Signed)
Pt refusing labs- asked phlebotomy to try again after 5 am.

## 2023-08-17 NOTE — Progress Notes (Signed)
Error result for 0435 CBG was not >600- recheck was 58 that is correct- Point of Care sheet filled out and sent to lab for correction

## 2023-08-17 NOTE — Telephone Encounter (Signed)
Mother called to say that she faxed over fmla forms for Dr. Royann Shivers to filled out. Calling to do a follow up on that. Need to know if our office received. Please advise

## 2023-08-18 DIAGNOSIS — J9601 Acute respiratory failure with hypoxia: Secondary | ICD-10-CM | POA: Diagnosis not present

## 2023-08-18 DIAGNOSIS — I63311 Cerebral infarction due to thrombosis of right middle cerebral artery: Secondary | ICD-10-CM | POA: Diagnosis not present

## 2023-08-18 DIAGNOSIS — E1010 Type 1 diabetes mellitus with ketoacidosis without coma: Secondary | ICD-10-CM | POA: Diagnosis not present

## 2023-08-18 DIAGNOSIS — I059 Rheumatic mitral valve disease, unspecified: Secondary | ICD-10-CM | POA: Diagnosis not present

## 2023-08-18 LAB — BASIC METABOLIC PANEL
Anion gap: 15 (ref 5–15)
BUN: 98 mg/dL — ABNORMAL HIGH (ref 6–20)
CO2: 23 mmol/L (ref 22–32)
Calcium: 8.8 mg/dL — ABNORMAL LOW (ref 8.9–10.3)
Chloride: 87 mmol/L — ABNORMAL LOW (ref 98–111)
Creatinine, Ser: 6.35 mg/dL — ABNORMAL HIGH (ref 0.61–1.24)
GFR, Estimated: 10 mL/min — ABNORMAL LOW (ref >60)
Glucose, Bld: 401 mg/dL — ABNORMAL HIGH (ref 70–99)
Potassium: 5.6 mmol/L — ABNORMAL HIGH (ref 3.5–5.1)
Sodium: 125 mmol/L — ABNORMAL LOW (ref 135–145)

## 2023-08-18 LAB — GLUCOSE, CAPILLARY
Glucose-Capillary: 254 mg/dL — ABNORMAL HIGH (ref 70–99)
Glucose-Capillary: 308 mg/dL — ABNORMAL HIGH (ref 70–99)
Glucose-Capillary: 368 mg/dL — ABNORMAL HIGH (ref 70–99)
Glucose-Capillary: 220 mg/dL — ABNORMAL HIGH (ref 70–99)

## 2023-08-18 LAB — CBC WITH DIFFERENTIAL/PLATELET
Abs Immature Granulocytes: 0.02 10*3/uL (ref 0.00–0.07)
Basophils Absolute: 0 10*3/uL (ref 0.0–0.1)
Basophils Relative: 1 %
Eosinophils Absolute: 0.4 10*3/uL (ref 0.0–0.5)
Eosinophils Relative: 5 %
HCT: 29.3 % — ABNORMAL LOW (ref 39.0–52.0)
Hemoglobin: 9.1 g/dL — ABNORMAL LOW (ref 13.0–17.0)
Immature Granulocytes: 0 %
Lymphocytes Relative: 12 %
Lymphs Abs: 1 10*3/uL (ref 0.7–4.0)
MCH: 28.9 pg (ref 26.0–34.0)
MCHC: 31.1 g/dL (ref 30.0–36.0)
MCV: 93 fL (ref 80.0–100.0)
Monocytes Absolute: 0.6 10*3/uL (ref 0.1–1.0)
Monocytes Relative: 8 %
Neutro Abs: 5.8 10*3/uL (ref 1.7–7.7)
Neutrophils Relative %: 74 %
Platelets: 285 10*3/uL (ref 150–400)
RBC: 3.15 MIL/uL — ABNORMAL LOW (ref 4.22–5.81)
RDW: 17.2 % — ABNORMAL HIGH (ref 11.5–15.5)
WBC: 7.8 10*3/uL (ref 4.0–10.5)
nRBC: 0 % (ref 0.0–0.2)

## 2023-08-18 LAB — MAGNESIUM: Magnesium: 2.7 mg/dL — ABNORMAL HIGH (ref 1.7–2.4)

## 2023-08-18 MED ORDER — INSULIN ASPART 100 UNIT/ML IJ SOLN
0.0000 [IU] | INTRAMUSCULAR | Status: DC
  Administered 2023-08-19: 2 [IU] via SUBCUTANEOUS
  Administered 2023-08-19: 1 [IU] via SUBCUTANEOUS
  Administered 2023-08-20: 2 [IU] via SUBCUTANEOUS
  Administered 2023-08-20: 1 [IU] via SUBCUTANEOUS
  Administered 2023-08-21: 2 [IU] via SUBCUTANEOUS
  Administered 2023-08-21 (×2): 1 [IU] via SUBCUTANEOUS
  Administered 2023-08-22: 3 [IU] via SUBCUTANEOUS
  Administered 2023-08-22: 9 [IU] via SUBCUTANEOUS
  Administered 2023-08-22: 2 [IU] via SUBCUTANEOUS
  Administered 2023-08-23: 3 [IU] via SUBCUTANEOUS
  Administered 2023-08-23: 2 [IU] via SUBCUTANEOUS
  Administered 2023-08-23: 1 [IU] via SUBCUTANEOUS
  Administered 2023-08-23: 2 [IU] via SUBCUTANEOUS
  Administered 2023-08-24: 1 [IU] via SUBCUTANEOUS

## 2023-08-18 MED ORDER — GABAPENTIN 250 MG/5ML PO SOLN: 300.0000 mg | Freq: Every day | ORAL | Status: DC

## 2023-08-18 MED ORDER — METOPROLOL TARTRATE 50 MG PO TABS
50.0000 mg | ORAL_TABLET | Freq: Two times a day (BID) | ORAL | Status: DC
  Administered 2023-08-18 – 2023-08-22 (×7): 50 mg
  Filled 2023-08-18 (×7): qty 1

## 2023-08-18 MED ORDER — NEPRO/CARBSTEADY PO LIQD: 1000.0000 mL | ORAL | Status: DC

## 2023-08-18 MED ORDER — APIXABAN 5 MG PO TABS
5.0000 mg | ORAL_TABLET | Freq: Two times a day (BID) | ORAL | Status: DC
  Administered 2023-08-18 – 2023-08-20 (×4): 5 mg
  Filled 2023-08-18 (×4): qty 1

## 2023-08-18 MED ORDER — INSULIN GLARGINE-YFGN 100 UNIT/ML ~~LOC~~ SOLN
10.0000 [IU] | Freq: Once | SUBCUTANEOUS | Status: AC
  Filled 2023-08-18: qty 0.1

## 2023-08-18 MED ORDER — MIDODRINE HCL 5 MG PO TABS
10.0000 mg | ORAL_TABLET | ORAL | Status: DC
  Administered 2023-08-18: 10 mg
  Filled 2023-08-18: qty 2

## 2023-08-18 MED ORDER — NEPRO/CARBSTEADY PO LIQD
1000.0000 mL | ORAL | Status: DC
  Administered 2023-08-18 – 2023-08-19 (×2): 1000 mL
  Filled 2023-08-18: qty 1000

## 2023-08-18 MED ORDER — INSULIN GLARGINE-YFGN 100 UNIT/ML ~~LOC~~ SOLN
10.0000 [IU] | Freq: Two times a day (BID) | SUBCUTANEOUS | Status: DC
  Administered 2023-08-18: 10 [IU] via SUBCUTANEOUS
  Filled 2023-08-18 (×2): qty 0.1

## 2023-08-18 MED ORDER — RENA-VITE PO TABS
1.0000 | ORAL_TABLET | Freq: Every day | ORAL | Status: DC
  Administered 2023-08-18 – 2023-08-20 (×3): 1
  Filled 2023-08-18 (×3): qty 1

## 2023-08-18 MED ORDER — LOPERAMIDE HCL 1 MG/7.5ML PO SUSP: 2.0000 mg | ORAL | Status: DC | PRN

## 2023-08-18 MED ORDER — INSULIN GLARGINE-YFGN 100 UNIT/ML ~~LOC~~ SOLN
20.0000 [IU] | Freq: Two times a day (BID) | SUBCUTANEOUS | Status: DC
  Administered 2023-08-18 – 2023-08-19 (×3): 20 [IU] via SUBCUTANEOUS
  Filled 2023-08-18 (×4): qty 0.2

## 2023-08-18 MED ORDER — HEPARIN SODIUM (PORCINE) 1000 UNIT/ML IJ SOLN
INTRAMUSCULAR | Status: AC
  Filled 2023-08-18: qty 5

## 2023-08-18 MED ORDER — QUETIAPINE FUMARATE 50 MG PO TABS: 50.0000 mg | ORAL_TABLET | Freq: Every day | ORAL | Status: DC

## 2023-08-18 MED ORDER — CALCITRIOL 1 MCG/ML PO SOLN: 1.5000 ug | ORAL | Status: DC

## 2023-08-18 MED ORDER — BANATROL TF EN LIQD
60.0000 mL | Freq: Three times a day (TID) | ENTERAL | Status: DC
  Administered 2023-08-18 – 2023-08-19 (×2): 60 mL
  Filled 2023-08-18 (×5): qty 60

## 2023-08-18 MED ORDER — ACETAMINOPHEN 160 MG/5ML PO SOLN
650.0000 mg | Freq: Four times a day (QID) | ORAL | Status: DC | PRN
  Administered 2023-08-20: 650 mg
  Filled 2023-08-18: qty 20.3

## 2023-08-18 NOTE — Progress Notes (Signed)
[facility] PROGRESS NOTE    Progress Note  [name]  AOZ:[id number] DOB: December 28, 1980 DOA: 07/09/2023 PCP: [name], NP     Brief Narrative:   [name] is an 42 y.o. male past medical history of end-stage renal disease on hemodialysis Monday Wednesday Friday, insulin-dependent diabetes mellitus, gastroparesis comes into close is: 07/08/2022 with altered mental status, she was recently seen in the the hospital on 04/09/2023 for sepsis with diabetic foot cultures grew MRSA during that time patient had catheter removed and replaced ID was consulted who recommended IV vancomycin.  Patient refused echo and left AGAINST MEDICAL ADVICE on 04/14/2023 was found confused by family members on 07/09/2023 brought into the ED and found to be encephalopathic with agitation, her blood glucose was 500 started on IV fluids and IV insulin for DKA CT of the head showed no acute findings.  CT scan of the abdomen and pelvis showed no acute findings but it did show hepatic steatosis, with a liver abnormality 7 x 4 cm septic with a white count of 20 was fluid resuscitated and started empirically on IV Vanco and Zosyn also.  Her blood pressure became soft PCCM was consulted for an ICU admission as she became hypoxic and worsen over night on 07/19/2023.  Had to be intubated eventually extubated.    Significant Events: 10/12: altered mental status, DKA 10/12: blood cultures positive for MRSA 10/13 TTE LVEF 25-30% w/ severely depressed LVF, global hypokinesis, gd I diastolic dysfxn, mod RV fxn reduction. Possible filamentous structure on MV 10/16 TEE LVEF 30-35%RV fxn nml. Large mass (2.00 cm x 0.88 cm ) on the posterior mitral leaflet which appeared to be extending in the mitral annulus. There are two mobile masses visible on the anterior leaflet of the tricupid valve which also appears to have a perforation. Another mobile  mass ( 0.68 cm x 0.67 cm) on the posterior tricupid leaflet. Moderate  Tricuspid regurgitation which appears to  be through the leaflet perforation  10/16 seen by Cardiothoracic surg. Felt poor candidate. Recommended 6 weeks abx and recommended repeat TEE 1 week 10/17 noting left arm weakness. Not clear when started.  MRI showed Right MCA infarct w/ acute infarcts in left occipital and bilateral cerebellum felt c/w septic emboli. Neuro consulted.  10/18 tunneled left femoral vasc cath placed after catheter holiday  10/20 palliative care consulted later that day worsening hypoxia. Sats 70s. After vomiting. More lethargic  10/21: resp failure requiring intubation shortly after midnight  10/22- TEE 10/22 with EF 35%, LV global hypokinesis. Very large vegetation lateral scallop of posterior mitral leaflet 3.9cm length 1 cm width, highly mobile, prolapsing into LV. No large vegetation seen on TV.  Later that afternoon and abrupt hemodynamic changes requiring escalation of vasopressor support.  Felt given the size of his vegetation and increased bulk of the vegetation infections unlikely to clear.  He was felt to be decompensation may have due to of breath leaflet perforation and acute mitral valve regurgb 10/23: per nursing less responsive than yesterday, increased ETT secretions. Clotting off CRRT. Ct head showing expected evolution of the right MCA stroke.  Cardiothoracic surgery re-consulted for worsening MVE. Felt too high risk here. Recommended referral to tertiary care  10/24 agitated when sedation decreased, is purposeful but does not follow commands. Tolerating UF gaols of -150. Still on NE infusion but extubated later that afternoon after passing SBT 10/25 failed swallow eval still on just 3 lpm O2. Encephalopathic. Added seroquel in effort to get  him off the precedex. Cardiology told no beds at [facility]. Not clear if accepted or not. Worsening pressor requirements. Got blood for hgb 6. Cefepime resumed for clinical decline and leukocytosis  10/26 Hemoglobin again dropped to  6.3 overnight with increased pressor requirement yesterday afternoon now on vaso and levo. 10/27 Hypoglycemic episode overnight, mental status improved a little over weekend  10/28 Getting another unit of blood more awake.  Oriented x 2.  Still on Precedex.  Having liquid stools. 10/30 Off Precedex, low-dose levo.  Plan to transition to IHD 10/29 Cefepime for aspiration course completed.  10/31 Seen by overnight team for hypoxia and somnolence ABG with mild hypercapnia. Again declined by [facility] because they were at capacity. More awake. Doing better w/ Bed side swallow but still likely aspirating. CRRT stopped.  11/1 Adjustments made for better glycemic control.  Much more awake but a little confused 11/4 Remains on low dose levo 11/5; weaned off pressors and transferred to progressive care unit Patient was extubated, transferred to progressive care unit under [facility] service on 08/04/2023  11/7-called [facility], at capacity.  Declined transfer 11/12-femoral line removed; palliative care consulted for goals of care 11/12-called [facility] for transfer, declined due to capacity 11/13 no changes. Will pursue LTACH options 11/14 no changes. Calmer today. Pt's mother, [name], would like to cont full aggressive care.  11/15 discussed possibility of PEG tube with patient's mother at this time she like to continue to increase oral feeds.   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 He is currently on midodrine with dialysis his blood pressure is holding up. Has defervesced leukocytosis improved. Has been denied for surgical intervention by [facility] surgery, [facility], and [facility]. Will need 6 weeks of antibiotics starting from 08/09/2023. Discontinue rectal tube. If he tolerates his diet can probably discontinue NG tube, nutrition has been consulted for calorie count.  Evolving cavitary  pneumonia with mucous plugging: On IV vancomycin per ID recommendations. Has been weaned to room air. Probably due to tricuspid endocarditis  Acute on chronic combined systolic and diastolic heart failure/possible cardiogenic shock: 2D echo showed an EF of 30% and vegetation tricuspid and mitral valve with severe regurgitation. Cont metoprolol,  tachycardic is improving blood pressure is holding up.   Will probably require surgical intervention of valves, CT surgery was consulted recommended transfer to tertiary center due to high risk of patient procedure. [facility] has declined the patient transferred time to due to capacity, [facility] denied for multiple prohibitive surgical risk. Last time [facility] was called was on 08/10/2023 and they declined due to capacity.  Ongoing sinus tachycardia: Has improved after initiating metoprolol on 08/11/2023, IV Dilaudid was decreased and his blood pressure is trending up. Metoprolol was increased tachycardia is improving. Try to keep potassium greater than 4 magnesium greater than 2. Currently on oral midodrine.  Lower extremity DVT: Positive on 08/02/2023, now on apixaban.  Acute metabolic encephalopathy with delirium and agitation/right MCA CVA: MRI of the brain on 07/15/2023 showed an M2 occlusion found to have a right MCA territory infarct favored septic emboli. Currently on restraints. Avoid Klonopin, use Seroquel at bedtime Haldol IV as needed. Was able to participate with PT OT will need LTAC.  End-stage renal disease on hemodialysis Monday Wednesday Friday: Nephrology following, he was successfully transferred to [facility] on 08/11/2023 Status post line holiday without catheter removal on 07/14/2023. IR placed tunneled catheter on 07/16/2023.  Lytes and volume status per nephrology.  Dysphagia: Tube feedings via core track, now on dysphagia 1 diet. Sleepy this morning.  Discontinue all narcotics. If he is able to tolerate his diet can discontinue core  track hopefully this week. The previous physician had extensive discussion with patient and family on 11/15 , 11/16 and then again 08/18/2023. Mother has refused PEG tube. The patient is not consuming enough calories to sustain himself in the last 24 hours he ate less than 50%. I have approached with the mother the need for PEG tube and she cont. To be unrealistic and saying that we are not allowing him to eat.  Hyperglycemia/DKA/diabetes mellitus type 2: Continue CBGs Q4 and sliding scale insulin. Increase long acting insulin.  Anemia/thrombocytopenia due to critical illness: Status post 5 units of packed red blood cells, hemoglobin has remained relatively stable.  Incidental liver mass: Seen on CT scan of the abdomen pelvis 7 x 4 cm anterior liver mass. Will need MRI as an outpatient.  Hyponatremia: Further management per nephrology.  Goals of care: Overall poor prognosis, unfortunately patient remain high risk of decompensating at this time.  Extensive discussion has been had with mother on 08/14/2023 and 08/13/2023 about prognosis. We have explained to her he has multiorgan failure and they have consulted palliative care. During that time the mother wishes to proceed with full aggressive treatment.  Unfortunately the patient does not have a consistent source of nutrition. [name] (mother wishes to continue oral intake) Attempts were made to place patient in LTAC however was declined due to lack of consistent nutrition. LTAC reports that if were able to place a PEG tube then we can reapply for LTAC placement. TOC is informed. Has been declining for [facility] to [facility]  Stage IIc decubitus ulcer: RN Pressure Injury Documentation: Pressure Injury 08/03/23 Buttocks Left Stage 2 -  Partial thickness loss of dermis presenting as a shallow open injury with a red, pink wound bed without slough. Several small open areas, right buttocks area (Active)  08/03/23 1945   Location: Buttocks  Location Orientation: Left  Staging: Stage 2 -  Partial thickness loss of dermis presenting as a shallow open injury with a red, pink wound bed without slough.  Wound Description (Comments): Several small open areas, right buttocks area  Present on Admission: No  Dressing Type Foam - Lift dressing to assess site every shift 08/17/23 0800    DVT prophylaxis: lovenox Family Communication:Mother Status is: Inpatient Remains inpatient appropriate because: Septic shock    Code Status:     Code Status Orders  (From admission, onward)           Start     Ordered   07/10/23 0130  Full code  Continuous       Question:  By:  Answer:  Consent: discussion documented in EHR   07/10/23 0131           Code Status History     Date Active Date Inactive Code Status Order ID Comments User Context   07/10/2023 0119 07/10/2023 0131 Full Code [id number]  Patrici Ranks, MD ED   04/09/2023 2237 04/15/2023 0003 Full Code [id number]  Briscoe Deutscher, MD ED   08/09/2021 1156 08/12/2021 0242 Full Code [id number]  Dellia Cloud, MD ED   05/19/2021 2107 05/23/2021 2342 Full Code [id number]  Hillary Bow, DO ED   10/14/2020 1357 10/22/2020 0740 Full Code [id number]  Emeline General, MD Inpatient   09/11/2020  0424 10/12/2020 0833 Full Code [id number]  Chotiner, Claudean Severance, MD ED   07/22/2020 0535 07/22/2020 2355 Full Code [id number]  Delano Metz, MD Inpatient   07/22/2020 0525 07/22/2020 0535 Full Code [id number]  Charlotte Sanes, MD ED   05/31/2020 0030 05/31/2020 0621 Full Code [id number]  Swayze, Ava, DO ED   02/14/2020 1159 02/18/2020 1935 Full Code [id number]  Erick Blinks, MD ED   01/31/2020 0317 02/01/2020 1645 Full Code [id number]  Eduard Clos, MD ED   04/24/2019 1358 04/27/2019 2047 Full Code [id number]  Lorenso Courier, MD ED   10/26/2018 1950 11/03/2018 1944 Full Code [id number]  Haydee Monica, MD ED   10/20/2018 1730 10/25/2018 2038 Full Code [id number]  Elease Etienne, MD  Inpatient   08/17/2018 2332 08/25/2018 1645 Full Code [id number]  John Giovanni, MD Inpatient   02/01/2018 1446 02/06/2018 0032 Full Code [id number]  Gwynn Burly, DO ED   10/25/2017 2225 11/03/2017 2322 Full Code [id number]  Clydie Braun, MD ED   11/16/2016 1338 11/16/2016 1649 Full Code [id number]  Sherren Kerns, MD Inpatient   05/27/2015 0707 05/29/2015 1809 Full Code [id number]  Arnaldo Natal Inpatient         IV Access:   Peripheral IV   Procedures and diagnostic studies:   No results found.   Medical Consultants:   None.   Subjective:    [name] sleepy, able to open his eyes  Objective:    Vitals:   08/17/23 2003 08/17/23 2350 08/18/23 0334 08/18/23 0435  BP: 133/88 122/73 (!) 132/91   Pulse: (!) 113 (!) 109 (!) 110   Resp: 20 17 18    Temp: 98.4 F (36.9 C) 99 F (37.2 C) 98.5 F (36.9 C)   TempSrc: Oral Oral Oral   SpO2: 97% 97%    Weight:    74.4 kg  Height:       SpO2: 97 % O2 Flow Rate (L/min): 4 L/min FiO2 (%): 32 %   Intake/Output Summary (Last 24 hours) at 08/18/2023 0752 Last data filed at 08/17/2023 1100 Gross per 24 hour  Intake 120 ml  Output --  Net 120 ml   Filed Weights   08/16/23 2129 08/17/23 0459 08/18/23 0435  Weight: 74.8 kg 75.3 kg 74.4 kg    Exam: General exam: In no acute distress. Respiratory system: Good air movement and clear to auscultation. Cardiovascular system: S1 & S2 heard, RRR. No JVD. Gastrointestinal system: Abdomen is nondistended, soft and nontender.  Extremities: No pedal edema. Skin: No rashes, lesions or ulcers  Data Reviewed:    Labs: Basic Metabolic Panel: Recent Labs  Lab 08/13/23 0800 08/14/23 0224 08/15/23 0219 08/16/23 1615 08/17/23 1053 08/18/23 0214  NA 128* 131* 126* 127* 126*  --   K 4.9 4.0 4.4 4.7 4.5  --   CL 92* 92* 89* 89* 90*  --   CO2 23 27 25 26 24   --   GLUCOSE 316* 250* 340* 89 161*  --   BUN 80* 47* 80* 131* 64*  --   CREATININE 6.11* 3.78* 5.37*  7.08* 4.54*  --   CALCIUM 9.8 9.5 9.8 9.7 9.0  --   MG 3.2* 2.5* 2.8* 3.2* 2.5* 2.7*  PHOS 4.8* 3.2 4.2 5.2* 4.3  --    GFR Estimated Creatinine Clearance: 19.6 mL/min (A) (by C-G formula based on SCr of 4.54 mg/dL (H)). Liver Function Tests: Recent Labs  Lab 08/13/23 0800 08/14/23 0224 08/15/23 0219 08/16/23 1615 08/17/23 1053  AST 21 18 18  14* 37  ALT 11 11 11 11 10   ALKPHOS 154* 143* 178* 136* 136*  BILITOT 0.6 0.8 0.8 0.8 0.9  PROT 7.4 7.7 8.0 7.1 8.2*  ALBUMIN 2.3* 2.3* 2.5* 2.3* 2.6*   No results for input(s): "LIPASE", "AMYLASE" in the last 168 hours. No results for input(s): "AMMONIA" in the last 168 hours. Coagulation profile No results for input(s): "INR", "PROTIME" in the last 168 hours. COVID-19 Labs  No results for input(s): "DDIMER", "FERRITIN", "LDH", "CRP" in the last 72 hours.  Lab Results  Component Value Date   SARSCOV2NAA NEGATIVE 07/10/2023   SARSCOV2NAA NEGATIVE 04/09/2023   SARSCOV2NAA NEGATIVE 08/09/2021   SARSCOV2NAA NEGATIVE 05/19/2021    CBC: Recent Labs  Lab 08/13/23 0800 08/14/23 0224 08/15/23 0219 08/16/23 1615 08/17/23 1053  WBC 10.8* 10.1 11.3* 10.0 10.5  NEUTROABS 7.8* 7.8* 8.4* 7.2 8.8*  HGB 8.4* 8.6* 9.9* 8.7* 10.3*  HCT 27.6* 28.1* 32.1* 27.7* 32.5*  MCV 95.8 93.7 93.9 93.9 93.4  PLT 323 355 380 345 350   Cardiac Enzymes: No results for input(s): "CKTOTAL", "CKMB", "CKMBINDEX", "TROPONINI" in the last 168 hours. BNP (last 3 results) No results for input(s): "PROBNP" in the last 8760 hours. CBG: Recent Labs  Lab 08/17/23 1129 08/17/23 1615 08/17/23 1955 08/17/23 2351 08/18/23 0348  GLUCAP 180* 174* 146* 144* 254*   D-Dimer: No results for input(s): "DDIMER" in the last 72 hours. Hgb A1c: No results for input(s): "HGBA1C" in the last 72 hours. Lipid Profile: No results for input(s): "CHOL", "HDL", "LDLCALC", "TRIG", "CHOLHDL", "LDLDIRECT" in the last 72 hours. Thyroid function studies: No results for input(s):  "TSH", "T4TOTAL", "T3FREE", "THYROIDAB" in the last 72 hours.  Invalid input(s): "FREET3" Anemia work up: No results for input(s): "VITAMINB12", "FOLATE", "FERRITIN", "TIBC", "IRON", "RETICCTPCT" in the last 72 hours. Sepsis Labs: Recent Labs  Lab 08/14/23 0224 08/15/23 0219 08/16/23 1615 08/17/23 1053  WBC 10.1 11.3* 10.0 10.5   Microbiology No results found for this or any previous visit (from the past 240 hour(s)).   Medications:    apixaban  5 mg Oral BID   calcitRIOL  1.5 mcg Oral Q M,W,F   Chlorhexidine Gluconate Cloth  6 each Topical Q0600   darbepoetin (ARANESP) injection - DIALYSIS  100 mcg Subcutaneous Q Fri-1800   feeding supplement (NEPRO CARB STEADY)  1,000 mL Per Tube Q24H   feeding supplement (PROSource TF20)  60 mL Per Tube Daily   fiber supplement (BANATROL TF)  60 mL Per Tube BID   gabapentin  300 mg Oral QHS   insulin aspart  0-5 Units Subcutaneous QHS   insulin aspart  0-9 Units Subcutaneous TID WC   insulin aspart  3 Units Subcutaneous TID WC   insulin glargine-yfgn  15 Units Subcutaneous Daily   metoprolol tartrate  50 mg Oral BID   midodrine  10 mg Oral Q M,W,F-HD   multivitamin  1 tablet Oral QHS   nutrition supplement (JUVEN)  1 packet Per Tube BID BM   mouth rinse  15 mL Mouth Rinse 4 times per day   QUEtiapine  50 mg Oral Q0600   Continuous Infusions:  vancomycin 750 mg (08/16/23 2215)      LOS: 39 days   Marinda Elk  [facility]  08/18/2023, 7:52 AM

## 2023-08-18 NOTE — Progress Notes (Signed)
Calorie Count Note: Day 2 Results   48 hour calorie count ordered.  Diet: Regular, nectar-thick liquids  Supplements: Mighty shake TID with meals, Mighty Shake TID with meals, each supplement provides 330 kcals and 9 grams of protein shake  No meal tickets available. Per RN yesterday he was only eating bites of his meals and is unsure if he drank any Mighty shakes.   Breakfast: 50 kcal, 2 gm protein  Lunch: 50 kcal, 2 gm protein  Dinner: 50 kcal, 2 gm protein  Supplements: N/A  Total intake: 150 kcal (7% of minimum estimated needs)  6 gm protein (5% of minimum estimated needs)  Nutrition Diagnosis: Severe Malnutrition related to chronic illness as evidenced by severe fat depletion, severe muscle depletion.   Goal: Patient will meet greater than or equal to 90% of their needs.  Intervention:  - Discontinue calorie count - Mighty Shake TID with meals, each supplement provides 330 kcals and 9 grams of protein - Continue renal MVI daily - Continue 1 packet Juven BID per tube to support wound healing, each packet provides 95 calories, 2.5 grams of protein, and 9.8 grams of carbohydrate - Continue Banatrol TF 60 ml BID per tube - Continue Nocturnal tube feedings:  Nepro Carb Steady @ 65 ml/hr x 12 hours from 1800 to 0600 (total of 780 ml nightly) with PROSource TF20 60 ml daily.  This would provide 1460 kcal, 83 grams of protein, and 567 ml of H2O.  Elliot Dally, RD Registered Dietitian  See Amion for more information

## 2023-08-18 NOTE — Telephone Encounter (Signed)
Patient identification verified by 2 forms. Marilynn Rail, RN    Called and spoke to Benard Rink states she no longer needs assistance with this, was told to speak to pulmonary  [name] has no further questions at this time

## 2023-08-18 NOTE — Inpatient Diabetes Management (Signed)
Inpatient Diabetes Program Recommendations  AACE/ADA: New Consensus Statement on Inpatient Glycemic Control (2015)  Target Ranges:  Prepandial:   less than 140 mg/dL      Peak postprandial:   less than 180 mg/dL (1-2 hours)      Critically ill patients:  140 - 180 mg/dL   Lab Results  Component Value Date   GLUCAP 368 (H) 08/18/2023   HGBA1C 8.9 (H) 07/10/2023    Review of Glycemic Control  Latest Reference Range & Units 08/17/23 19:55 08/17/23 23:51 08/18/23 03:48 08/18/23 08:23 08/18/23 11:48  Glucose-Capillary 70 - 99 mg/dL 130 (H) 865 (H) 784 (H) 308 (H) 368 (H)  Diabetes history: DM 1 Outpatient Diabetes medications:  Outpatient Diabetes medications: Novolin 70/30 10 units BID, Novolin R 12 units TID Current orders for Inpatient glycemic control: Novolog 0-9 units TID & HS Novolog 3 units TID Semglee 10 units bid Nepro Carb steady ml/hr from 1800-0600  Inpatient Diabetes Program Recommendations:   Note blood sugars increased overnight. This is likely due to nocturnal tube feeds- Also note that patient did not get Novolog SSI or meal coverage at 0800 per RN documentation- May want to increase frequency of Novolog correction to q 4 hours while on nocturnal feeds- IF feeds are stopped/ will need reduction in insulin (especially Semglee).   Thanks,  Beryl Meager, RN, BC-ADM Inpatient Diabetes Coordinator Pager [phone]  (8a-5p)

## 2023-08-18 NOTE — Plan of Care (Signed)
  Problem: Self-Care: Goal: Ability to participate in self-care as condition permits will improve Outcome: Progressing Goal: Verbalization of feelings and concerns over difficulty with self-care will improve Outcome: Progressing Goal: Ability to communicate needs accurately will improve Outcome: Progressing   Problem: Nutrition: Goal: Risk of aspiration will decrease Outcome: Progressing Goal: Dietary intake will improve Outcome: Progressing   Problem: Education: Goal: Knowledge of disease or condition will improve Outcome: Progressing Goal: Knowledge of secondary prevention will improve (MUST DOCUMENT ALL) Outcome: Progressing Goal: Knowledge of patient specific risk factors will improve Loraine Leriche N/A or DELETE if not current risk factor) Outcome: Progressing   Problem: Ischemic Stroke/TIA Tissue Perfusion: Goal: Complications of ischemic stroke/TIA will be minimized Outcome: Progressing   Problem: Coping: Goal: Will verbalize positive feelings about self Outcome: Progressing Goal: Will identify appropriate support needs Outcome: Progressing   Problem: Health Behavior/Discharge Planning: Goal: Ability to manage health-related needs will improve Outcome: Progressing Goal: Goals will be collaboratively established with patient/family Outcome: Progressing   Problem: Self-Care: Goal: Ability to participate in self-care as condition permits will improve Outcome: Progressing Goal: Verbalization of feelings and concerns over difficulty with self-care will improve Outcome: Progressing Goal: Ability to communicate needs accurately will improve Outcome: Progressing   Problem: Nutrition: Goal: Risk of aspiration will decrease Outcome: Progressing Goal: Dietary intake will improve Outcome: Progressing   Problem: Safety: Goal: Non-violent Restraint(s) Outcome: Progressing   Problem: Education: Goal: Knowledge of General Education information will improve Description:  Including pain rating scale, medication(s)/side effects and non-pharmacologic comfort measures Outcome: Progressing   Problem: Health Behavior/Discharge Planning: Goal: Ability to manage health-related needs will improve Outcome: Progressing   Problem: Clinical Measurements: Goal: Ability to maintain clinical measurements within normal limits will improve Outcome: Progressing Goal: Will remain free from infection Outcome: Progressing Goal: Diagnostic test results will improve Outcome: Progressing Goal: Respiratory complications will improve Outcome: Progressing Goal: Cardiovascular complication will be avoided Outcome: Progressing   Problem: Activity: Goal: Risk for activity intolerance will decrease Outcome: Progressing   Problem: Nutrition: Goal: Adequate nutrition will be maintained Outcome: Progressing   Problem: Coping: Goal: Level of anxiety will decrease Outcome: Progressing   Problem: Elimination: Goal: Will not experience complications related to bowel motility Outcome: Progressing Goal: Will not experience complications related to urinary retention Outcome: Progressing   Problem: Pain Management: Goal: General experience of comfort will improve Outcome: Progressing   Problem: Safety: Goal: Ability to remain free from injury will improve Outcome: Progressing   Problem: Skin Integrity: Goal: Risk for impaired skin integrity will decrease Outcome: Progressing

## 2023-08-18 NOTE — Progress Notes (Signed)
Received patient in bed to unit.  Alert and oriented.  Informed consent signed and in chart.   TX duration: 3:15  Patient tolerated well.  Transported back to the room  Alert, without acute distress.  Hand-off given to patient's nurse.   Access used: Left Femoral TDC Access issues: None   Total UF removed: 2500 mL Medication(s) given: Vancomycin 750 mg Post HD VS: please see data insert    08/18/23 2015  Vitals  Temp 97.8 F (36.6 C)  Temp Source Oral  BP 129/78  MAP (mmHg) 94  BP Location Right Arm  BP Method Automatic  Patient Position (if appropriate) Lying  Pulse Rate (!) 104  Pulse Rate Source Monitor  ECG Heart Rate (!) 105  Resp 16  Oxygen Therapy  SpO2 98 %  O2 Device Room Air  Patient Activity (if Appropriate) In bed  Pulse Oximetry Type Continuous  Post Treatment  Dialyzer Clearance Lightly streaked  Hemodialysis Intake (mL) 0 mL  Liters Processed 68.2  Fluid Removed (mL) 2500 mL  Tolerated HD Treatment Yes  Post-Hemodialysis Comments Treatment completed and blood returned without issues.  Note  Patient Observations Patient alert, no c/o voiced, no acute distress noted; patient condition stable for return transport.  Hemodialysis Catheter Left Femoral vein Double lumen Permanent (Tunneled)  Placement Date/Time: 07/16/23 1318   Serial / Lot #: [id number]  Expiration Date: 05/24/26  Time Out: Correct patient;Correct site;Correct procedure  Maximum sterile barrier precautions: Hand hygiene;Cap;Mask;Sterile gown;Sterile gloves;Large sterile ...  Site Condition No complications  Blue Lumen Status Flushed;Heparin locked;Dead end cap in place  Red Lumen Status Flushed;Heparin locked;Dead end cap in place  Purple Lumen Status N/A  Catheter fill solution Heparin 1000 units/ml  Catheter fill volume (Arterial) 2.3 cc  Catheter fill volume (Venous) 2.3  Dressing Type Transparent  Dressing Status Antimicrobial disc in place;Clean, Dry, Intact  Interventions  Dressing changed;New dressing  Drainage Description None  Dressing Change Due 08/25/23  Post treatment catheter status Capped and Clamped      [facility]

## 2023-08-18 NOTE — Progress Notes (Signed)
Speech Language Pathology Treatment: Dysphagia  Patient Details Name: [name] MRN: [id number] DOB: [birth year] Today's Date: 08/18/2023 Time: 1410-1435 SLP Time Calculation (min) (ACUTE ONLY): 25 min  Assessment / Plan / Recommendation Clinical Impression  Patient seen by SLP for skilled treatment focused on dysphagia goals. His Aunt was in the room briefly and had gotten him some food from [employer] (chicken sandwich, fries and lemonade which had been thickened appropriately. Patient was awake, alert and receptive to having some of his food. Prior to SLP entering room, patient had already eaten approximately 25% of the chicken sandwich. SLP graduate student assisted with feeding patient. He required frequent cues due to distractibility, was very verbose and tangential. He would hold food in mouth, pocketing on left side of buccal cavity but residuals cleared with cues to reswallow and with liquid sips. One delayed throat clear observed at end of PO intake but otherwise, patient appears to be tolerating current PO diet. SLP will continue to follow.    HPI HPI: Pt is a 42 yo male presenting 10/11 with AMS. His dialysis catheter was partially removed and he was in DKA. Admitted for septic shock from MRSA bacteremia; infective endocarditis. LUE weakness was noted 10/17 and MRI confirmed sizable infarct in the R MCA branch as well as small acute infarcts in the L occipital lobe and bilateral cerebellum. Pt initially evaluated by SLP 10/18 and recommended to have Dys 3 diet and thin liquids due primarily oral symptoms. Pt had worsening hypoxia and AMS after vomiting on 10/21 requiring intubation 10/21-10/24. 10/22-10/30 CRRT. 11/08 MBS, dysphagia 1/nectar. PMH includes: ESRD, DM, gastroparesis, L AKA, HTN, recent admission in July for sepsis secondary to R diabetic foot ulcer (left AMA)      SLP Plan  Continue with current plan of care      Recommendations for follow up therapy are one  component of a multi-disciplinary discharge planning process, led by the attending physician.  Recommendations may be updated based on patient status, additional functional criteria and insurance authorization.    Recommendations  Diet recommendations: Regular;Nectar-thick liquid Liquids provided via: Cup;Straw Medication Administration: Whole meds with puree Supervision: Staff to assist with self feeding Compensations: Minimize environmental distractions;Slow rate;Small sips/bites;Lingual sweep for clearance of pocketing;Monitor for anterior loss Postural Changes and/or Swallow Maneuvers: Seated upright 90 degrees                  Oral care BID   Frequent or constant Supervision/Assistance Dysphagia, oropharyngeal phase (R13.12)     Continue with current plan of care     Angela Nevin, MA, CCC-SLP Speech Therapy

## 2023-08-18 NOTE — Progress Notes (Signed)
[facility] Progress Note  Subjective: seen in room, no c/o's  Vitals:   08/17/23 2350 08/18/23 0334 08/18/23 0435 08/18/23 0839  BP: 122/73 (!) 132/91  (!) 152/88  Pulse: (!) 109 (!) 110  (!) 111  Resp: 17 18    Temp: 99 F (37.2 C) 98.5 F (36.9 C)  98.2 F (36.8 C)  TempSrc: Oral Oral  Axillary  SpO2: 97%     Weight:   74.4 kg   Height:        Exam: Gen: alert, NG tube on  CVS: Regular tachycardia, S1 and S2 Resp: Transmitted breath sounds, no distinct rales or rhonchi, [place] O2 Abd: Soft, flat, nontender, bowel sounds normal Ext: Status post left AKA  LUE edema, no other edema Access: Left femoral TDC in place    Renal-related home meds: - sensipar 120 at bedtime - gabapentin 100mg  - renvavite - renvela 3 ac tid    OP HD: MWF [facility] 4h  400/1.5    77kg   2/3 bath  [facility] TDC    Heparin none - last OP HD 10/7, post wt 74.6kg - misses HD once every other week approx - rocaltrol 1.50 mcg    Assessment/ Plan: MRSA bacteremia w/ MV endocarditis: due to HD cath infection. SP line holiday w/ old TDC removed 10/16 and new TDC placed 10/18 by IR.  Needs IV vanc for 6 wks thru 09/20/23.  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  ESRD: on HD MWF. SP CRRT 10/22- 10/30, back on iHD now. HD today Volume: [place] 2L O2 dc'd on 11/10, remains on RA. Euvolemic on exam except LUE edema. New dry wt maybe 73- 75kg.  Anemia: Hb 7- 9 range. ESA dose increased to 100 mcg q. weekly. 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 Monitor on renal diet.  On calcitriol for PTH control. Nutrition: cortrak in place, getting tube feeds, taking po as well Hypotension: Weaned off of pressors and weaned off midodrine HD access: SP line holiday with new L  femoral TDC placed on 10/18. 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 Dispo: he successfully dialyzed in a chair 11/18. Will not need  to do this again. Ok for dc to SNF.      Vinson Moselle MD  [facility] 08/18/2023, 12:24 PM  Recent Labs  Lab 08/16/23 1615 08/17/23 1053  HGB 8.7* 10.3*  ALBUMIN 2.3* 2.6*  CALCIUM 9.7 9.0  PHOS 5.2* 4.3  CREATININE 7.08* 4.54*  K 4.7 4.5   No results for input(s): "IRON", "TIBC", "FERRITIN" in the last 168 hours. Inpatient medications:  apixaban  5 mg Per Tube BID   [START ON 08/20/2023] calcitRIOL  1.5 mcg Per Tube Q M,W,F   Chlorhexidine Gluconate Cloth  6 each Topical Q0600   darbepoetin (ARANESP) injection - DIALYSIS  100 mcg Subcutaneous Q Fri-1800   feeding supplement (NEPRO CARB STEADY)  1,000 mL Per Tube Q24H   feeding supplement (PROSource TF20)  60 mL Per Tube Daily   fiber supplement (BANATROL TF)  60 mL Per Tube BID   insulin aspart  0-5 Units Subcutaneous QHS   insulin aspart  0-9 Units Subcutaneous TID WC   insulin aspart  3 Units Subcutaneous TID WC   insulin glargine-yfgn  10 Units Subcutaneous BID   metoprolol tartrate  50 mg Per Tube BID   midodrine  10 mg Per Tube Q M,W,F-HD   multivitamin  1 tablet Per Tube QHS   nutrition supplement (JUVEN)  1 packet Per Tube BID BM   mouth rinse  15 mL Mouth Rinse 4 times per day    vancomycin 750 mg (08/16/23 2215)   acetaminophen (TYLENOL) oral liquid 160 mg/5 mL, albuterol, Gerhardt's butt cream, haloperidol lactate, heparin sodium (porcine), lip balm, loperamide HCl, mouth rinse

## 2023-08-18 NOTE — Progress Notes (Signed)
Occupational Therapy Treatment Patient Details Name: [name] MRN: [id number] DOB: 1981/02/26 Today's Date: 08/18/2023   History of present illness Patient is a 42 y/o male admitted 07/09/23 with confusion found to be in septic shock found to have MRSA in blood cultures, in DKA, TEE positive for vegitation on multiple valves, MRI showing R MCA infarct.  Pt with resp failure on 10/21 resulting in intubation and transfer to [facility]; extubated 10/24.  CRRT started 10/22-stopped 10/30.  PMH positive for ESRD on HD, IDDM, gastroparesis, L AKA, HTN.   OT comments  RN requesting to see pt in morning rather than afternoon today due to pt with dialysis this afternoon. Pt lethargic and RN reporting likely due to medication. Able to achieve awake state but not fully alert. Pt following 2-3 commands this session with significantly increased time. Worked on sitting balance EOB and pt needing min-mod A to maintain midline noted with L lateral lean. Pt receptive to cues for posture/midline with increased time. 1-2 verbalizations this session. RN aware of session. Continue to recommend OT at long term acute care.       If plan is discharge home, recommend the following:  Two people to help with walking and/or transfers;Two people to help with bathing/dressing/bathroom;Assistance with cooking/housework;Assistance with feeding;Help with stairs or ramp for entrance;Assist for transportation;Direct supervision/assist for financial management;Direct supervision/assist for medications management   Equipment Recommendations  Other (comment) (defer)    Recommendations for Other Services      Precautions / Restrictions Precautions Precautions: Fall Precaution Comments: LAKA, L hemiparesis, coretrak, blood clot LUE Restrictions Weight Bearing Restrictions: Yes LLE Weight Bearing: Non weight bearing       Mobility Bed Mobility Overal bed mobility: Needs Assistance Bed Mobility: Supine to Sit, Sit to Supine      Supine to sit: +2 for physical assistance, Total assist, HOB elevated Sit to supine: Total assist, +2 for physical assistance   General bed mobility comments: limited participation this session    Transfers                   General transfer comment: deferred     Balance Overall balance assessment: Needs assistance Sitting-balance support: Single extremity supported, Bilateral upper extremity supported, Feet supported Sitting balance-[name]: Poor Sitting balance - Comments: Able to achieve upright positioning EOB with min-mod A for sitting balance this date                                   ADL either performed or assessed with clinical judgement   ADL Overall ADL's : Needs assistance/impaired     Grooming: Maximal assistance;Sitting;Wash/dry face                                      Extremity/Trunk Assessment Upper Extremity Assessment Upper Extremity Assessment: LUE deficits/detail LUE Deficits / Details: No active movement; unable to detect stimuli poor awareness of arm in space and no awareness or attempts to protect during mobility. Improvement in edema noted and decreased tone in digits   Lower Extremity Assessment Lower Extremity Assessment: Defer to PT evaluation        Vision   Vision Assessment?: Vision impaired- to be further tested in functional context Additional Comments: Significantly decreased attention to L this session.   Perception     Praxis  Cognition Arousal: Lethargic (Able to achieve calm awake state but not fully alert. RN states may be secondary to medication as pt unable to output any urine. Dialysis later today) Behavior During Therapy: Flat affect Overall Cognitive Status: Impaired/Different from baseline Area of Impairment: Orientation, Attention, Memory, Following commands, Safety/judgement, Awareness, Problem solving                   Current Attention Level: Focused Memory:  Decreased short-term memory Following Commands: Follows one step commands inconsistently (inconsistently this session; likely due to lethargy) Safety/Judgement: Decreased awareness of safety, Decreased awareness of deficits Awareness: Intellectual Problem Solving: Slow processing, Decreased initiation, Difficulty sequencing, Requires verbal cues, Requires tactile cues General Comments: Pt lethargic this sesssion following one step commands <20% of session and needing 1-2 minutes to process and follow command. RN suspects lethargy related to medication overload related to no output source and hopefully better after dialysis today. Pt with significant decreased attention to L side and only making eye contact with tehrapist on the right. RN aware        Exercises Exercises: Other exercises Other Exercises Other Exercises: PROM LUE shoulder/scapula/elbow /wrist hand 15x ea joint/movement. pt with limited awareness OT ranging hand/arm despite cues to attend Other Exercises: Worked on sitting balance at EOB. Pt able to attend to cues for very brief periods and able to maintain awake state but not fully altert. Following 2-3 commands at EOB but otherwise limited active participation secondary to lethargy.    Shoulder Instructions       General Comments      Pertinent Vitals/ Pain       Pain Assessment Pain Assessment: Faces Faces Pain Scale: No hurt Pain Intervention(s): Monitored during session  Home Living                                          Prior Functioning/Environment              Frequency  Min 2X/week        Progress Toward Goals  OT Goals(current goals can now be found in the care plan section)  Progress towards OT goals: Progressing toward goals  Acute Rehab OT Goals Patient Stated Goal: none stated OT Goal Formulation: With patient/family Time For Goal Achievement: 08/30/23 Potential to Achieve Goals: Good ADL Goals Pt Will Perform  Grooming: with min assist;sitting Pt Will Perform Lower Body Bathing: with mod assist;sit to/from stand Pt Will Perform Lower Body Dressing: sitting/lateral leans;with max assist Pt Will Transfer to Toilet: with max assist;with transfer board;bedside commode Pt Will Perform Toileting - Clothing Manipulation and hygiene: with mod assist;sitting/lateral leans Pt/caregiver will Perform Home Exercise Program: Increased ROM;Left upper extremity Additional ADL Goal #1: Pt will follow all one step commands with no more than min cues Additional ADL Goal #2: Pt will tolerate LUE ROM and wear schedule for L resting hand splint.  Plan      Co-evaluation                 AM-PAC OT "6 Clicks" Daily Activity     Outcome Measure   Help from another person eating meals?: A Lot Help from another person taking care of personal grooming?: A Lot Help from another person toileting, which includes using toliet, bedpan, or urinal?: Total Help from another person bathing (including washing, rinsing, drying)?: Total Help from another person to put  on and taking off regular upper body clothing?: Total Help from another person to put on and taking off regular lower body clothing?: Total 6 Click Score: 8    End of Session    OT Visit Diagnosis: Other abnormalities of gait and mobility (R26.89);Other symptoms and signs involving the nervous system (R29.898);Hemiplegia and hemiparesis;Muscle weakness (generalized) (M62.81);Other symptoms and signs involving cognitive function Hemiplegia - Right/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

## 2023-08-18 NOTE — Progress Notes (Addendum)
Nutrition Follow-up  DOCUMENTATION CODES:   Severe malnutrition in context of chronic illness  INTERVENTION:  - Transition to 24 hour continuous tube feeds via Cortrak to meet 100% of estimated needs: - Nepro Carb Steady @ 55 ml/hr (1320 ml/day) - PROSource TF20 60 ml daily   Tube feeding regimen provides 2416 kcal, 127 grams of protein, and 960 ml of H2O.  - Discontinue calorie count  - Mighty Shake TID with meals, each supplement provides 330 kcals and 9 grams of protein - Continue renal MVI daily  - Continue 1 packet Juven BID per tube to support wound healing, each packet provides 95 calories, 2.5 grams of protein, and 9.8 grams of carbohydrate  - Increase Banatrol TF 60 ml to TID per tube  - Recommend adding insulin coverage every 4 hours for tube feeds   NUTRITION DIAGNOSIS:   Severe Malnutrition related to chronic illness as evidenced by severe fat depletion, severe muscle depletion.  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  10/22 - CRRT start, TEE confirming large MV vegetation with AML perforation 10/24 - extubated 10/25 - Cortrak placed (tip near pylorus) 10/30 - CRRT off 11/03 - TF changed to Nepro 11/05 - Cortrak clogged and removed, replaced with NG tube 11/06 - pt removed NG tube, Cortrak placed (tip distal stomach) 11/08 - diet advanced to dysphagia 1 with  nectar-thick liquids 11/10 - TF stopped due to high blood sugar 11/11 - TF resumed 11/18: Regular diet, nectar thick liquids 11/19 - Nocturnal tube feeds started 11/20 - Transitioned back to 24 hour continuous tube feeds due to poor PO intake  48 hour calorie count ended and showed pt is consuming negligible amounts by PO. Nocturnal tube feeds were started yesterday to help promote intake but pt has been very lethargic and intake has decreased since starting this new regimen. Will transition him back to 24 hour continuous tube feeds due to decreased PO intake. Per MD if pt is able to tolerate diet may discontinue coretrak later this week. Mom refuses PEG placement.   RN informed me that his aunt says he is eating outside food but not hospital food. She went to get him chick-fil-a today. RN and I both agree that unless his family consistently brings in food pt will not be able to meet his needs. RN to speak to family about this later today.   Pt does not have Q4 insulin coverage, messaged MD and is ok to add.Victorino Dike, diabetes coordinator, to modify insulin coverage.   Pt had a large loose stool this morning, will increase Banatrol to TID. Pt was able to successfully dialyzed in chair 11/18. Per Nephrology new dry weight may be 73-75 kg.   Admit weight: 77.6 kg  Current weight: 74.4 kg    Average Meal Intake: 11/19-11/20: 30% intake x 6 recorded meals  Nutritionally Relevant Medications: Scheduled Meds:  [  START ON 08/20/2023] calcitRIOL  1.5 mcg Per Tube Q M,W,F   darbepoetin (ARANESP) injection - DIALYSIS  100 mcg Subcutaneous Q Fri-1800   feeding supplement (NEPRO CARB STEADY)  1,000 mL Per Tube Q24H   feeding supplement (PROSource TF20)  60 mL Per Tube Daily   fiber supplement (BANATROL TF)  60 mL Per Tube TID   insulin aspart  0-5 Units Subcutaneous QHS   insulin aspart  0-9 Units Subcutaneous TID WC   insulin aspart  3 Units Subcutaneous TID WC   insulin glargine-yfgn  10 Units  Subcutaneous BID   metoprolol tartrate  50 mg Per Tube BID   multivitamin  1 tablet Per Tube QHS   nutrition supplement (JUVEN)  1 packet Per Tube BID BM   Continuous Infusions:  vancomycin 750 mg (08/16/23 2215)   Labs Reviewed: Sodium 126, BUN 64, Creatinine 4.54, Magnesium 2.7,  CBG ranges from 58-600 mg/dL over the last 24 hours HgbA1c 8.9 (07/10/23)  Diet Order:   Diet Order             Diet regular Fluid consistency: Nectar Thick  Diet effective now                   EDUCATION NEEDS:   Not appropriate for education at this time  Skin:  Skin Assessment: Skin Integrity Issues: Skin Integrity Issues:: Stage II Stage II: R buttocks Other: skin tear to sacrum-small open red area in between buttocks, red abrasion to left buttocks  Last BM:  11/20: Large, loose stool  Height:   Ht Readings from Last 1 Encounters:  07/19/23 5\' 7"  (1.702 m)    Weight:   Wt Readings from Last 1 Encounters:  08/18/23 74.4 kg    Ideal Body Weight:  61.9 kg (adjusted for L AKA)  BMI:  Body mass index is 25.69 kg/m.  Estimated Nutritional Needs:   Kcal:  2200-2500kcal/day  Protein:  120-135g/day  Fluid:  UOP +1L  Elliot Dally, RD Registered Dietitian  See Amion for more information

## 2023-08-18 NOTE — Progress Notes (Signed)
Pt had a very large loose stool and it required a lot of cleaning- placed external rectal pouch

## 2023-08-19 DIAGNOSIS — I059 Rheumatic mitral valve disease, unspecified: Secondary | ICD-10-CM | POA: Diagnosis not present

## 2023-08-19 LAB — GLUCOSE, CAPILLARY
Glucose-Capillary: 121 mg/dL — ABNORMAL HIGH (ref 70–99)
Glucose-Capillary: 134 mg/dL — ABNORMAL HIGH (ref 70–99)
Glucose-Capillary: 136 mg/dL — ABNORMAL HIGH (ref 70–99)
Glucose-Capillary: 139 mg/dL — ABNORMAL HIGH (ref 70–99)
Glucose-Capillary: 164 mg/dL — ABNORMAL HIGH (ref 70–99)
Glucose-Capillary: 170 mg/dL — ABNORMAL HIGH (ref 70–99)

## 2023-08-19 LAB — MAGNESIUM: Magnesium: 2.5 mg/dL — ABNORMAL HIGH (ref 1.7–2.4)

## 2023-08-19 MED ORDER — BOOST / RESOURCE BREEZE PO LIQD CUSTOM
1.0000 | Freq: Three times a day (TID) | ORAL | Status: DC
  Administered 2023-08-19: 1 via ORAL
  Administered 2023-08-19: 237 mL via ORAL
  Administered 2023-08-23 (×2): 1 via ORAL

## 2023-08-19 MED ORDER — CHLORHEXIDINE GLUCONATE CLOTH 2 % EX PADS
6.0000 | MEDICATED_PAD | Freq: Every day | CUTANEOUS | Status: DC
  Administered 2023-08-20: 6 via TOPICAL

## 2023-08-19 NOTE — Progress Notes (Signed)
Physical Therapy Treatment Patient Details Name: [name] MRN: [id number] DOB: [birth year] Today's Date: 08/19/2023   History of Present Illness Patient is a 42 y/o male admitted 07/09/23 with confusion found to be in septic shock found to have MRSA in blood cultures, in DKA, TEE positive for vegitation on multiple valves, MRI showing R MCA infarct.  Pt with resp failure on 10/21 resulting in intubation and transfer to [facility]; extubated 10/24.  CRRT started 10/22-stopped 10/30.  PMH positive for ESRD on HD, IDDM, gastroparesis, L AKA, HTN.    PT Comments  Pt is not progressing towards goals. Currently pt has been able to sit EOB at Total A for bed mobility and fluctuating between SBA to Total A sitting EOB. Pt has difficulty following directions, staying on task and Shifting wgt without heavy multi modal cueing.  Due to current functional status, co-morbidities, home set up and available assistance at home continuing to recommend skilled physical therapy services in long term acute care hospital setting in order to decrease risk for falls, injury and re-hospitalization.    If plan is discharge home, recommend the following: Two people to help with walking and/or transfers;Assist for transportation;Supervision due to cognitive status;Help with stairs or ramp for entrance   Can travel by private vehicle     No  Equipment Recommendations  Hoyer lift;Wheelchair (measurements PT);Wheelchair cushion (measurements PT)       Precautions / Restrictions Precautions Precautions: Fall Precaution Comments: LAKA, L hemiparesis, coretrak, blood clot LUE Restrictions Weight Bearing Restrictions: Yes LLE Weight Bearing: Non weight bearing     Mobility  Bed Mobility Overal bed mobility: Needs Assistance Bed Mobility: Supine to Sit, Sit to Supine   Sidelying to sit: +2 for physical assistance, Total assist Supine to sit: +2 for physical assistance, Total assist, HOB elevated     General bed  mobility comments: limited ability to participate; very distracted today. Pt able to sit EOB with brief period of SBA Mostly Max A to Total A sitting EOB with heavy cueing.    Transfers   General transfer comment: deferred     Modified Rankin (Stroke Patients Only) Modified Rankin (Stroke Patients Only) Pre-Morbid Rankin Score: Moderate disability Modified Rankin: Severe disability     Balance Overall balance assessment: Needs assistance Sitting-balance support: Single extremity supported, Bilateral upper extremity supported, Feet supported Sitting balance-[name]: Zero Sitting balance - Comments: Able to achieve upright positioning EOB with SBA briefly 50% of the time was Max A to Total A sitting EOB today Postural control: Left lateral lean     Standing balance comment: NT      Cognition Arousal: Alert Behavior During Therapy: Flat affect Overall Cognitive Status: Impaired/Different from baseline Area of Impairment: Attention, Following commands, Safety/judgement, Awareness       Following Commands: Follows one step commands inconsistently Safety/Judgement: Decreased awareness of safety, Decreased awareness of deficits   Problem Solving: Slow processing, Decreased initiation, Difficulty sequencing, Requires verbal cues, Requires tactile cues General Comments: Pt very distracted this session difficult to follow directions without heavy initiation           General Comments General comments (skin integrity, edema, etc.): Aunt present during session and another aunt visited as well. Pt was very distractable today difficulty staying on task and following directions. Heavy cueing for initiation      Pertinent Vitals/Pain Pain Assessment Pain Assessment: Faces Faces Pain Scale: No hurt Breathing: normal Negative Vocalization: none Facial Expression: smiling or inexpressive Body Language: relaxed Consolability: no  need to console PAINAD Score: 0     PT Goals  (current goals can now be found in the care plan section) Acute Rehab PT Goals Patient Stated Goal: none stated PT Goal Formulation: Patient unable to participate in goal setting Time For Goal Achievement: 08/25/23 Potential to Achieve Goals: Fair Progress towards PT goals: Not progressing toward goals - comment (Pt has not made progress towards goals. Continues to require a varying degrees of assist sitting EOB. Fluctuating between SBA to Total A with heavy L lean.)    Frequency    Min 1X/week      PT Plan  Continue with current POC       AM-PAC PT "6 Clicks" Mobility   Outcome Measure  Help needed turning from your back to your side while in a flat bed without using bedrails?: Total Help needed moving from lying on your back to sitting on the side of a flat bed without using bedrails?: Total Help needed moving to and from a bed to a chair (including a wheelchair)?: Total Help needed standing up from a chair using your arms (e.g., wheelchair or bedside chair)?: Total Help needed to walk in hospital room?: Total Help needed climbing 3-5 steps with a railing? : Total 6 Click Score: 6    End of Session   Activity Tolerance: Other (comment) (limited due to impaired cognition and AMS) Patient left: in bed;with call bell/phone within reach;with bed alarm set;with family/visitor present Nurse Communication: Mobility status PT Visit Diagnosis: Other abnormalities of gait and mobility (R26.89);Other symptoms and signs involving the nervous system (R29.898);Hemiplegia and hemiparesis Hemiplegia - Right/Left: Left Hemiplegia - dominant/non-dominant: Non-dominant Hemiplegia - caused by: Cerebral infarction     Time: 5784-6962 PT Time Calculation (min) (ACUTE ONLY): 29 min  Charges:    $Therapeutic Activity: 23-37 mins PT General Charges $$ ACUTE PT VISIT: 1 Visit                    Harrel Carina, DPT, CLT  Acute Rehabilitation Services Office: [phone] (Secure chat  preferred)    Claudia Desanctis 08/19/2023, 11:41 AM

## 2023-08-19 NOTE — Progress Notes (Signed)
[facility] Progress Note  Subjective: seen in room, no c/o's  Vitals:   08/18/23 2015 08/19/23 0605 08/19/23 0833 08/19/23 1048  BP: 129/78  (!) 132/97 (!) 141/93  Pulse: (!) 104     Resp: 16  18 19   Temp: 97.8 F (36.6 C)  97.8 F (36.6 C) 97.8 F (36.6 C)  TempSrc: Oral  Oral Oral  SpO2: 98%  97% 97%  Weight:  74.4 kg    Height:        Exam: Gen: alert, NG tube on  CVS: Regular tachycardia, S1 and S2 Resp: Transmitted breath sounds, no distinct rales or rhonchi, [place] O2 Abd: Soft, flat, nontender, bowel sounds normal Ext: Status post left AKA  LUE edema, no other edema Access: Left femoral TDC in place    Renal-related home meds: - sensipar 120 at bedtime - gabapentin 100mg  - renvavite - renvela 3 ac tid    OP HD: MWF [facility] 4h  400/1.5    77kg   2/3 bath  [facility] TDC    Heparin none - last OP HD 10/7, post wt 74.6kg - misses HD once every other week approx - rocaltrol 1.50 mcg    Assessment/ Plan: MRSA bacteremia w/ MV endocarditis: due to HD cath infection. SP line holiday w/ old TDC removed 10/16 and new TDC placed 10/18 by IR.  Needs IV vanc for 6 wks thru 09/20/23.  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  ESRD: on HD MWF. SP CRRT 10/22- 10/30, back on iHD now. HD friday Volume:  2L O2 dc'd on 11/10, remains on RA. Euvolemic on exam except LUE edema. New dry wt about 73- 75kg.  Anemia: Hb 7- 9 range. ESA dose increased to 100 mcg q. weekly. 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 Monitor on renal diet.  On calcitriol for PTH control. Nutrition: cortrak in place, getting tube feeds, taking po as well Hypotension: Weaned off of pressors and off midodrine HD access: SP line holiday with new L  femoral TDC placed on 10/18. 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 Dispo: he successfully dialyzed in a chair 11/18. Will not need to do this  again. Ok for dc to SNF.      Vinson Moselle MD  [facility] 08/19/2023, 12:16 PM  Recent Labs  Lab 08/16/23 1615 08/17/23 1053 08/18/23 1245 08/18/23 1533  HGB 8.7* 10.3*  --  9.1*  ALBUMIN 2.3* 2.6*  --   --   CALCIUM 9.7 9.0 8.8*  --   PHOS 5.2* 4.3  --   --   CREATININE 7.08* 4.54* 6.35*  --   K 4.7 4.5 5.6*  --    No results for input(s): "IRON", "TIBC", "FERRITIN" in the last 168 hours. Inpatient medications:  apixaban  5 mg Per Tube BID   [START ON 08/20/2023] calcitRIOL  1.5 mcg Per Tube Q M,W,F   Chlorhexidine Gluconate Cloth  6 each Topical Q0600   darbepoetin (ARANESP) injection - DIALYSIS  100 mcg Subcutaneous Q Fri-1800   feeding supplement  1 Container Oral TID BM   feeding supplement (PROSource TF20)  60 mL Per Tube Daily   insulin aspart  0-9 Units Subcutaneous Q4H   insulin aspart  3 Units Subcutaneous TID WC   insulin glargine-yfgn  20 Units Subcutaneous BID   metoprolol tartrate  50 mg Per Tube BID   midodrine  10 mg Per Tube Q  M,W,F-HD   multivitamin  1 tablet Per Tube QHS   nutrition supplement (JUVEN)  1 packet Per Tube BID BM   mouth rinse  15 mL Mouth Rinse 4 times per day    vancomycin 750 mg (08/18/23 1842)   acetaminophen (TYLENOL) oral liquid 160 mg/5 mL, albuterol, Gerhardt's butt cream, heparin sodium (porcine), lip balm, loperamide HCl, mouth rinse

## 2023-08-19 NOTE — Progress Notes (Signed)
Brief Nutrition Note  Pt last seen by RD Team for full Nutrition Follow-up yesterday. Please see note for further details.  A 48-hour calorie count was completed for pt and demonstrated that hey met 16% of minimum estimated kcal needs and 14% of minimum estimated protein needs on average.  Estimated Nutritional Needs:   Kcal: 2200-2500   Protein: 120-135 grams  Fluid: UOP +1 L  08/16/23 Total Intake: 561 kcal (26% of minimum estimated needs)  28 grams of protein (23% of minimum estimated needs)  08/17/23 Total Intake: 150 kcal (7% of minimum estimated needs)  6 grams of protein (5% of minimum estimated needs)  Pt was started back on continuous tube feeds yesterday due to extremely poor PO intake. Recommendation has been made for PEG placement but pt's mother has refused at this time. Per RN, pt consumed more than half of his lunch today. Decision was made by MD to discontinue tube feeds. MD ordered Boost Breeze oral nutrition supplements which will need to be thickened to nectar-thick consistency.  RD will continue to follow pt during admission.   Mertie Clause, MS, RD, LDN Registered Dietitian II Please see AMiON for contact information.

## 2023-08-19 NOTE — Progress Notes (Signed)
Patient refused vitals.

## 2023-08-19 NOTE — Progress Notes (Signed)
PROGRESS NOTE    [name]  ZOX:[id number] DOB: November 25, 1980 DOA: 07/09/2023 PCP: [name], NP   Brief Narrative:  [name] is an 42 y.o. male past medical history of end-stage renal disease on hemodialysis Monday Wednesday Friday, insulin-dependent diabetes mellitus, gastroparesis comes into close is: 07/08/2022 with altered mental status, she was recently seen in the the hospital on 04/09/2023 for sepsis with diabetic foot cultures grew MRSA during that time patient had catheter removed and replaced ID was consulted who recommended IV vancomycin.  Patient refused echo and left AGAINST MEDICAL ADVICE on 04/14/2023 was found confused by family members on 07/09/2023 brought into the ED and found to be encephalopathic with agitation, her blood glucose was 500 started on IV fluids and IV insulin for DKA CT of the head showed no acute findings.  CT scan of the abdomen and pelvis showed no acute findings but it did show hepatic steatosis, with a liver abnormality 7 x 4 cm septic with a white count of 20 was fluid resuscitated and started empirically on IV Vanco and Zosyn also.  Her blood pressure became soft PCCM was consulted for an ICU admission as she became hypoxic and worsen over night on 07/19/2023.  Hospital course 10/12 - Altered mental status, DKA 10/12 - Blood cultures positive for MRSA 10/13 - TTE LVEF 25-30% w/ severely depressed LVF, global hypokinesis, gd I diastolic dysfxn, mod RV fxn reduction. Possible filamentous structure on MV 10/16 - TEE LVEF 30-35%RV fxn nml. Noted masses on the mitral and tricuspid valves 10/16 - Seen by Cardiothoracic surg. Felt poor candidate. Recommended 6 weeks abx and recommended repeat TEE 1 week 10/17 - Acute right MCA infarct - left occipital and bilateral cerebellum felt c/w septic emboli. Neuro consulted.  10/18 - Tunneled left femoral vasc cath placed after catheter holiday  10/20 - 21 Worsening lethargy/confusion intubated overnight  20/21st 10/22 - TEE LVEF 35%, LV global hypokinesis. Very large mitral vegetation(mobile) - prolapsing into LV.  Increased pressors required. 10/23- per nursing less responsive than yesterday, increased ETT secretions. Clotting off CRRT. Ct head showing expected evolution of the right MCA stroke.  Cardiothoracic surgery re-consulted for worsening MVE. Felt too high risk here. Recommended referral to tertiary care  10/24 agitated when sedation decreased, is purposeful but does not follow commands. Tolerating UF gaols of -150. Still on NE infusion but extubated later that afternoon after passing SBT 10/25 failed swallow eval still on just 3 lpm O2. Encephalopathic. Added seroquel in effort to get him off the precedex. Cardiology told no beds at [facility]. Not clear if accepted or not. Worsening pressor requirements. Got blood for hgb 6. Cefepime resumed for clinical decline and leukocytosis  10/26 Hemoglobin again dropped to 6.3 overnight with increased pressor requirement yesterday afternoon now on vaso and levo. 10/27 Hypoglycemic episode overnight, mental status improved a little over weekend  10/28 Getting another unit of blood more awake.  Oriented x 2.  Still on Precedex.  Having liquid stools. 10/30 Off Precedex, low-dose levo.  Plan to transition to IHD 10/29 Cefepime for aspiration course completed.  10/31 Seen by overnight team for hypoxia and somnolence ABG with mild hypercapnia. Again declined by [facility] because they were at capacity. More awake. Doing better w/ Bed side swallow but still likely aspirating. CRRT stopped.  11/4 Remains on low dose levo 11/5-6; extubated, weaned off pressors and transferred to progressive care unit - [facility] assumed care 11/6 11/7-called [facility], at capacity.  Declined transfer 11/12-femoral line  removed; palliative care consulted for goals of care 11/12-[facility] declined transfer due to capacity 11/13 -15 No changes -pursuing LTACH placement - mother refusing PEG placement -  continue full scope of care  11/21 Discontinue tube feeds (nepro/banatrol supplement) - follow for PO intake tolerance for continued dispo planning.    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)   Hemiparesis affecting left side as late effect of stroke ([name])   Dysphagia due to recent stroke   Physical deconditioning   Cavitary pneumonia   Subclavian vein occlusion, bilateral ([name])   Acute blood loss anemia   Fatty liver   Liver mass   Insulin dependent type 1 diabetes mellitus ([name])   Diabetic gastroparesis ([name])   Septic shock ([name])   Anemia of chronic disease   Iron deficiency anemia, unspecified   Secondary hyperparathyroidism of renal origin ([name])   Thrombocytopenia ([name])   Acute metabolic encephalopathy   MRSA bacteremia   Right elbow pain   ESRD on hemodialysis ([name])   Acute systolic CHF (congestive heart failure) ([name])   DCM (dilated cardiomyopathy) ([name])   Coronary artery calcification seen on CAT scan   Infective endocarditis of tricuspid valve   Protein-calorie malnutrition, severe   Hx of AKA (above knee amputation), left ([name])   Cerebrovascular accident (CVA) due to embolism of cerebral artery ([name])    Dysphagia: Mod-severe protein caloric malnutrition -Patient continues to have poor p.o. intake per staff, family continues to be somewhat hesitant for PEG tube placement Discontinue tube feeds and follow p.o. intake -will need to discuss permanent avenue for nutrition if patient cannot keep up appropriately on his own.  He will likely need a PEG tube unless p.o. intake improves -Off narcotics, sedation, CNS depressants now for more than 24 hours, these are not affecting his p.o. intake at this point despite prior concerns from family The patient is not consuming enough calories to  sustain himself in the last 24 hours he ate less than 50%. Per previous team "approached mother with the need for PEG tube and she continues to be unrealistic and saying that we are not allowing him to eat"  Goals of care: -Overall poor prognosis, unfortunately patient remain high risk of decompensating at this time. -Extensive conversation with patient's mother, power of attorney multiple times over the past week about prognosis and goals of care -Currently has disposition is limited by poor p.o. intake and malnutrition, he cannot discharge to LTAC given poor nutrition and request for PEG tube placement from their facility has been denied by patient's mother -Multiple attempts to transfer patient to outside facility for further evaluation and care -[facility] declined patient transfer  Septic shock due to infective mitral and tricuspid endocarditis in the setting of MRSA bacteremia, resolved: Off pressors, on midodrine for dialysis only Has been denied for surgical intervention by [facility] surgery, [facility], and [facility]. Will need 6 weeks of vancomycin starting from 08/09/2023 (stop date 09/19/2023) given with dialysis 3 times weekly.   Evolving cavitary pneumonia with mucous plugging: On IV vancomycin per ID recommendations as above   Acute on chronic combined systolic and diastolic heart failure/possible cardiogenic shock: 2D echo showed an EF of 30% and vegetation tricuspid and mitral valve with severe regurgitation. Continues on metoprolol Likely to require surgical intervention  of valves, CT surgery was consulted recommended transfer to tertiary center due to high risk of patient procedure. [facility] has declined the patient transferred time to due to capacity, [facility] denied for multiple prohibitive surgical risks Last [facility] denial 08/10/2023   Ongoing sinus tachycardia: Continue metoprolol Off narcotics Currently on oral midodrine.   Lower extremity  DVT: Positive on 08/02/2023, continues on apixaban.   Acute metabolic encephalopathy with delirium and agitation/right MCA CVA: MRI of the brain on 07/15/2023 showed an M2 occlusion found to have a right MCA territory infarct favored septic emboli. Off all sedation per discussion with family - concern this was affecting his appetite/PO intake PT/OT recommending LTACH   End-stage renal disease on hemodialysis Monday Wednesday Friday: Nephrology following Status post line holiday without catheter removal on 07/14/2023. IR placed tunneled catheter on 07/16/2023.  Hyperglycemia/DKA/diabetes mellitus type 2, uncontrolled with hyperglycemia A1C 8.9 - continue sliding scale ongoing Semglee 20u BID ongoing Liberalize diet to increase PO intake   Anemia/thrombocytopenia due to critical illness: Status post 5 units of packed red blood cells, hemoglobin has remains stable.   Incidental liver mass: Seen on CT scan of the abdomen pelvis 7 x 4 cm anterior liver mass. Will need MRI as an outpatient.   Hyponatremia: Further management per nephrology.    Stage IIc decubitus ulcer Pressure Injury 08/03/23 Buttocks Left Stage 2 -  Partial thickness loss of dermis presenting as a shallow open injury with a red, pink wound bed without slough. Several small open areas, right buttocks area (Active)  08/03/23 1945  Location: Buttocks  Location Orientation: Left  Staging: Stage 2 -  Partial thickness loss of dermis presenting as a shallow open injury with a red, pink wound bed without slough.  Wound Description (Comments): Several small open areas, right buttocks area  Present on Admission: No (present on transfer to [facility])   DVT prophylaxis: Place and maintain sequential compression device Start: 07/30/23 1415 apixaban (ELIQUIS) tablet 5 mg   Code Status:   Code Status: Full Code  Family Communication: Mother  Status is: Inpt  Dispo: The patient is from: Home              Anticipated d/c is to:  LTAC              Anticipated d/c date is: 24-48h              Patient currently IS medically stable for discharge  Consultants:  PCCM, neuro  Antimicrobials:  Vancomycin x6 weeks   Subjective: No acute issue/events overnight  Objective: Vitals:   08/18/23 1930 08/18/23 2000 08/18/23 2015 08/19/23 0605  BP: 106/76 111/73 129/78   Pulse: (!) 102 (!) 103 (!) 104   Resp: 13 15 16    Temp:   97.8 F (36.6 C)   TempSrc:   Oral   SpO2: 95% 100% 98%   Weight:    74.4 kg  Height:        Intake/Output Summary (Last 24 hours) at 08/19/2023 0715 Last data filed at 08/18/2023 2015 Gross per 24 hour  Intake 60 ml  Output 2500 ml  Net -2440 ml   Filed Weights   08/18/23 0435 08/18/23 1623 08/19/23 0605  Weight: 74.4 kg 81.2 kg 74.4 kg    Examination:  General exam: Appears calm and comfortable  Respiratory system: Clear to auscultation. Respiratory effort normal. Cardiovascular system: S1 & S2 heard, RRR. No JVD, murmurs, rubs, gallops or clicks. No pedal edema. Gastrointestinal system: Abdomen is nondistended,  soft and nontender. No organomegaly or masses felt. Normal bowel sounds heard. Central nervous system: Alert and oriented. No focal neurological deficits. Extremities: Symmetric 5 x 5 power. Skin: No rashes, lesions or ulcers Psychiatry: Judgement and insight appear normal. Mood & affect appropriate.     Data Reviewed: I have personally reviewed following labs and imaging studies  CBC: Recent Labs  Lab 08/14/23 0224 08/15/23 0219 08/16/23 1615 08/17/23 1053 08/18/23 1533  WBC 10.1 11.3* 10.0 10.5 7.8  NEUTROABS 7.8* 8.4* 7.2 8.8* 5.8  HGB 8.6* 9.9* 8.7* 10.3* 9.1*  HCT 28.1* 32.1* 27.7* 32.5* 29.3*  MCV 93.7 93.9 93.9 93.4 93.0  PLT 355 380 345 350 285   Basic Metabolic Panel: Recent Labs  Lab 08/13/23 0800 08/14/23 0224 08/15/23 0219 08/16/23 1615 08/17/23 1053 08/18/23 0214 08/18/23 1245 08/19/23 0237  NA 128* 131* 126* 127* 126*  --  125*  --    K 4.9 4.0 4.4 4.7 4.5  --  5.6*  --   CL 92* 92* 89* 89* 90*  --  87*  --   CO2 23 27 25 26 24   --  23  --   GLUCOSE 316* 250* 340* 89 161*  --  401*  --   BUN 80* 47* 80* 131* 64*  --  98*  --   CREATININE 6.11* 3.78* 5.37* 7.08* 4.54*  --  6.35*  --   CALCIUM 9.8 9.5 9.8 9.7 9.0  --  8.8*  --   MG 3.2* 2.5* 2.8* 3.2* 2.5* 2.7*  --  2.5*  PHOS 4.8* 3.2 4.2 5.2* 4.3  --   --   --    GFR: Estimated Creatinine Clearance: 14 mL/min (A) (by C-G formula based on SCr of 6.35 mg/dL (H)). Liver Function Tests: Recent Labs  Lab 08/13/23 0800 08/14/23 0224 08/15/23 0219 08/16/23 1615 08/17/23 1053  AST 21 18 18  14* 37  ALT 11 11 11 11 10   ALKPHOS 154* 143* 178* 136* 136*  BILITOT 0.6 0.8 0.8 0.8 0.9  PROT 7.4 7.7 8.0 7.1 8.2*  ALBUMIN 2.3* 2.3* 2.5* 2.3* 2.6*   No results for input(s): "LIPASE", "AMYLASE" in the last 168 hours. No results for input(s): "AMMONIA" in the last 168 hours. Coagulation Profile: No results for input(s): "INR", "PROTIME" in the last 168 hours. Cardiac Enzymes: No results for input(s): "CKTOTAL", "CKMB", "CKMBINDEX", "TROPONINI" in the last 168 hours. BNP (last 3 results) No results for input(s): "PROBNP" in the last 8760 hours. HbA1C: No results for input(s): "HGBA1C" in the last 72 hours. CBG: Recent Labs  Lab 08/18/23 0348 08/18/23 0823 08/18/23 1148 08/18/23 2227 08/19/23 0555  GLUCAP 254* 308* 368* 220* 134*   Lipid Profile: No results for input(s): "CHOL", "HDL", "LDLCALC", "TRIG", "CHOLHDL", "LDLDIRECT" in the last 72 hours. Thyroid Function Tests: No results for input(s): "TSH", "T4TOTAL", "FREET4", "T3FREE", "THYROIDAB" in the last 72 hours. Anemia Panel: No results for input(s): "VITAMINB12", "FOLATE", "FERRITIN", "TIBC", "IRON", "RETICCTPCT" in the last 72 hours. Sepsis Labs: No results for input(s): "PROCALCITON", "LATICACIDVEN" in the last 168 hours.  No results found for this or any previous visit (from the past 240 hour(s)).        Radiology Studies: No results found.      Scheduled Meds:  apixaban  5 mg Per Tube BID   [START ON 08/20/2023] calcitRIOL  1.5 mcg Per Tube Q M,W,F   Chlorhexidine Gluconate Cloth  6 each Topical Q0600   darbepoetin (ARANESP) injection - DIALYSIS  100 mcg  Subcutaneous Q Fri-1800   feeding supplement (NEPRO CARB STEADY)  1,000 mL Per Tube Q24H   feeding supplement (PROSource TF20)  60 mL Per Tube Daily   fiber supplement (BANATROL TF)  60 mL Per Tube TID   insulin aspart  0-9 Units Subcutaneous Q4H   insulin aspart  3 Units Subcutaneous TID WC   insulin glargine-yfgn  20 Units Subcutaneous BID   metoprolol tartrate  50 mg Per Tube BID   midodrine  10 mg Per Tube Q M,W,F-HD   multivitamin  1 tablet Per Tube QHS   nutrition supplement (JUVEN)  1 packet Per Tube BID BM   mouth rinse  15 mL Mouth Rinse 4 times per day   Continuous Infusions:  vancomycin 750 mg (08/18/23 1842)     LOS: 40 days   Time spent:  Azucena Fallen, DO [facility]  If 7PM-7AM, please contact night-coverage [email or web address]  08/19/2023, 7:15 AM

## 2023-08-20 ENCOUNTER — Inpatient Hospital Stay (HOSPITAL_COMMUNITY): Payer: Medicare HMO

## 2023-08-20 DIAGNOSIS — I059 Rheumatic mitral valve disease, unspecified: Secondary | ICD-10-CM | POA: Diagnosis not present

## 2023-08-20 DIAGNOSIS — R4182 Altered mental status, unspecified: Secondary | ICD-10-CM | POA: Diagnosis not present

## 2023-08-20 DIAGNOSIS — R569 Unspecified convulsions: Secondary | ICD-10-CM

## 2023-08-20 LAB — CBC
HCT: 31.9 % — ABNORMAL LOW (ref 39.0–52.0)
Hemoglobin: 10.2 g/dL — ABNORMAL LOW (ref 13.0–17.0)
MCH: 29.5 pg (ref 26.0–34.0)
MCHC: 32 g/dL (ref 30.0–36.0)
MCV: 92.2 fL (ref 80.0–100.0)
Platelets: 226 10*3/uL (ref 150–400)
RBC: 3.46 MIL/uL — ABNORMAL LOW (ref 4.22–5.81)
RDW: 17.2 % — ABNORMAL HIGH (ref 11.5–15.5)
WBC: 20.9 10*3/uL — ABNORMAL HIGH (ref 4.0–10.5)
nRBC: 0 % (ref 0.0–0.2)

## 2023-08-20 LAB — RENAL FUNCTION PANEL
Albumin: 2.4 g/dL — ABNORMAL LOW (ref 3.5–5.0)
Anion gap: 14 (ref 5–15)
BUN: 94 mg/dL — ABNORMAL HIGH (ref 6–20)
CO2: 23 mmol/L (ref 22–32)
Calcium: 8.9 mg/dL (ref 8.9–10.3)
Chloride: 94 mmol/L — ABNORMAL LOW (ref 98–111)
Creatinine, Ser: 6.7 mg/dL — ABNORMAL HIGH (ref 0.61–1.24)
GFR, Estimated: 10 mL/min — ABNORMAL LOW (ref >60)
Glucose, Bld: 114 mg/dL — ABNORMAL HIGH (ref 70–99)
Phosphorus: 4.6 mg/dL (ref 2.5–4.6)
Potassium: 5 mmol/L (ref 3.5–5.1)
Sodium: 131 mmol/L — ABNORMAL LOW (ref 135–145)

## 2023-08-20 LAB — GLUCOSE, CAPILLARY
Glucose-Capillary: 111 mg/dL — ABNORMAL HIGH (ref 70–99)
Glucose-Capillary: 36 mg/dL — CL (ref 70–99)
Glucose-Capillary: 37 mg/dL — CL (ref 70–99)
Glucose-Capillary: 54 mg/dL — ABNORMAL LOW (ref 70–99)
Glucose-Capillary: 75 mg/dL (ref 70–99)
Glucose-Capillary: 75 mg/dL (ref 70–99)
Glucose-Capillary: 110 mg/dL — ABNORMAL HIGH (ref 70–99)
Glucose-Capillary: 22 mg/dL — CL (ref 70–99)
Glucose-Capillary: 74 mg/dL (ref 70–99)
Glucose-Capillary: 151 mg/dL — ABNORMAL HIGH (ref 70–99)
Glucose-Capillary: 49 mg/dL — ABNORMAL LOW (ref 70–99)
Glucose-Capillary: 69 mg/dL — ABNORMAL LOW (ref 70–99)
Glucose-Capillary: 93 mg/dL (ref 70–99)
Glucose-Capillary: 59 mg/dL — ABNORMAL LOW (ref 70–99)
Glucose-Capillary: 101 mg/dL — ABNORMAL HIGH (ref 70–99)
Glucose-Capillary: 78 mg/dL (ref 70–99)
Glucose-Capillary: 94 mg/dL (ref 70–99)
Glucose-Capillary: 55 mg/dL — ABNORMAL LOW (ref 70–99)
Glucose-Capillary: 123 mg/dL — ABNORMAL HIGH (ref 70–99)
Glucose-Capillary: 100 mg/dL — ABNORMAL HIGH (ref 70–99)
Glucose-Capillary: 85 mg/dL (ref 70–99)
Glucose-Capillary: 140 mg/dL — ABNORMAL HIGH (ref 70–99)

## 2023-08-20 LAB — BLOOD GAS, ARTERIAL
Acid-Base Excess: 1 mmol/L (ref 0.0–2.0)
Bicarbonate: 25.2 mmol/L (ref 20.0–28.0)
Drawn by: 5273
O2 Saturation: 96 %
Patient temperature: 37.8
pCO2 arterial: 39 mm[Hg] (ref 32–48)
pH, Arterial: 7.42 (ref 7.35–7.45)
pO2, Arterial: 75 mm[Hg] — ABNORMAL LOW (ref 83–108)

## 2023-08-20 LAB — MAGNESIUM: Magnesium: 2.6 mg/dL — ABNORMAL HIGH (ref 1.7–2.4)

## 2023-08-20 LAB — VANCOMYCIN, RANDOM: Vancomycin Rm: 28 ug/mL

## 2023-08-20 MED ORDER — ALTEPLASE 2 MG IJ SOLR: 2.0000 mg | Freq: Once | INTRAMUSCULAR | Status: DC | PRN

## 2023-08-20 MED ORDER — PENTAFLUOROPROP-TETRAFLUOROETH EX AERO: 1.0000 | INHALATION_SPRAY | CUTANEOUS | Status: DC | PRN

## 2023-08-20 MED ORDER — DEXTROSE 50 % IV SOLN
INTRAVENOUS | Status: AC
  Administered 2023-08-20: 50 mL
  Filled 2023-08-20: qty 50

## 2023-08-20 MED ORDER — LIDOCAINE HCL (PF) 1 % IJ SOLN: 5.0000 mL | INTRAMUSCULAR | Status: DC | PRN

## 2023-08-20 MED ORDER — DEXTROSE-SODIUM CHLORIDE 5-0.45 % IV SOLN: INTRAVENOUS | Status: AC

## 2023-08-20 MED ORDER — LIDOCAINE-PRILOCAINE 2.5-2.5 % EX CREA: 1.0000 | TOPICAL_CREAM | CUTANEOUS | Status: DC | PRN

## 2023-08-20 MED ORDER — HEPARIN SODIUM (PORCINE) 1000 UNIT/ML DIALYSIS: 1000.0000 [IU] | INTRAMUSCULAR | Status: DC | PRN

## 2023-08-20 MED ORDER — LIDOCAINE HCL (PF) 1 % IJ SOLN
5.0000 mL | INTRAMUSCULAR | Status: DC | PRN
Start: 2023-08-20 — End: 2023-08-22

## 2023-08-20 MED ORDER — DEXTROSE 50 % IV SOLN
1.0000 | Freq: Once | INTRAVENOUS | Status: AC
  Administered 2023-08-20: 50 mL via INTRAVENOUS

## 2023-08-20 MED ORDER — ANTICOAGULANT SODIUM CITRATE 4% (200MG/5ML) IV SOLN: 5.0000 mL | Status: DC | PRN

## 2023-08-20 MED ORDER — LIDOCAINE-PRILOCAINE 2.5-2.5 % EX CREA
1.0000 | TOPICAL_CREAM | CUTANEOUS | Status: DC | PRN
Start: 2023-08-20 — End: 2023-08-22

## 2023-08-20 MED ORDER — HEPARIN SODIUM (PORCINE) 1000 UNIT/ML DIALYSIS
1000.0000 [IU] | INTRAMUSCULAR | Status: DC | PRN
Start: 2023-08-20 — End: 2023-08-22
  Filled 2023-08-20: qty 1

## 2023-08-20 MED ORDER — NEPRO/CARBSTEADY PO LIQD: 237.0000 mL | ORAL | Status: DC | PRN

## 2023-08-20 MED ORDER — VANCOMYCIN HCL 500 MG/100ML IV SOLN: 500.0000 mg | INTRAVENOUS | Status: DC

## 2023-08-20 MED ORDER — GLUCOSE 40 % PO GEL
1.0000 | Freq: Once | ORAL | Status: AC | PRN
  Administered 2023-08-20: 31 g via ORAL
  Filled 2023-08-20: qty 1.21

## 2023-08-20 MED ORDER — DEXTROSE 50 % IV SOLN
INTRAVENOUS | Status: AC
  Filled 2023-08-20: qty 50

## 2023-08-20 MED ORDER — PENTAFLUOROPROP-TETRAFLUOROETH EX AERO
1.0000 | INHALATION_SPRAY | CUTANEOUS | Status: DC | PRN
Start: 2023-08-20 — End: 2023-08-22

## 2023-08-20 MED ORDER — HEPARIN SODIUM (PORCINE) 1000 UNIT/ML DIALYSIS: 40.0000 [IU]/kg | INTRAMUSCULAR | Status: DC | PRN

## 2023-08-20 MED ORDER — ANTICOAGULANT SODIUM CITRATE 4% (200MG/5ML) IV SOLN
5.0000 mL | Status: DC | PRN
Start: 2023-08-20 — End: 2023-08-22

## 2023-08-20 MED ORDER — DEXTROSE 50 % IV SOLN
50.0000 mL | Freq: Once | INTRAVENOUS | Status: AC
  Administered 2023-08-20: 50 mL via INTRAVENOUS
  Filled 2023-08-20: qty 50

## 2023-08-20 NOTE — Progress Notes (Signed)
STAT EEG complete - results pending. ? ?

## 2023-08-20 NOTE — Progress Notes (Signed)
Hypoglycemic Event  CBG: 22   Treatment: D50 50 mL (25 gm)  Symptoms: Shaky  Follow-up CBG: Time:0755 CBG Result:110   Possible Reasons for Event: Inadequate meal intake  Comments/MD notified: MD Natale Milch notified. Orders to hold Insulin     Trula Ore E Safaa Stingley

## 2023-08-20 NOTE — Progress Notes (Signed)
Patient's FMLA paperwork placed in chart, per patient's mother's request.

## 2023-08-20 NOTE — Progress Notes (Signed)
Pharmacy Antibiotic Note  [name] is a 42 y.o. 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 Pharmacy has been consulted for Vancomycin dosing.  11/15 Vancomycin random pre-HD 22 11/22 Vancomycin random pre-HD 28 above goal range (15-25)  Plan: - Reduce vancomycin to 500 mg IV q HD MWF  - Will monitor HD tolerance and dosing - Plan weekly levels on Friday with note  - Tentative stop date: 09/19/23 - entered    Height: 5\' 7"  (170.2 cm) Weight: 74.8 kg (165 lb) IBW/kg (Calculated) : 66.1  Temp (24hrs), Avg:98.1 F (36.7 C), Min:97.8 F (36.6 C), Max:98.4 F (36.9 C)  Recent Labs  Lab 08/14/23 0224 08/15/23 0219 08/16/23 1615 08/17/23 1053 08/18/23 1245 08/18/23 1533 08/20/23 0222 08/20/23 0815  WBC 10.1 11.3* 10.0 10.5  --  7.8  --  20.9*  CREATININE 3.78* 5.37* 7.08* 4.54* 6.35*  --   --   --   VANCORANDOM  --   --   --   --   --   --  28  --     Estimated Creatinine Clearance: 14 mL/min (A) (by C-G formula based on SCr of 6.35 mg/dL (H)).    No Known Allergies  Antimicrobials this admission: Vancomycin 10/12 >>(12/22) Cefepime 10/21 >> 10/23; restart 10/25 >> 10/29  Microbiology results: 10/12 MRSA PCR >> positive 10/12 CDiff antigen +, toxin -, PCR - >> negative 10/12 BCx >> 4/4 MRSA 10/13 BCx >> NGf 10/17 BCx >> NGf 10/21 RCx >> NOF  Thank you for allowing pharmacy to be a part of this patient's care.  Ruben Im, PharmD Clinical Pharmacist 08/20/2023 9:23 AM Please check AMION for all [facility] Pharmacy numbers

## 2023-08-20 NOTE — Progress Notes (Signed)
BG 59, 1 amp D50 given, patient refusing to eat or drink.

## 2023-08-20 NOTE — Progress Notes (Signed)
Patient refusing to eat or drink anything.

## 2023-08-20 NOTE — Procedures (Signed)
Patient Name: [name]  MRN: [id number]  Epilepsy Attending: Charlsie Quest  Referring Physician/Provider: Marjorie Smolder, NP  Date: 08/20/2023 Duration: 27.13 mins  Patient history: 42 yo M wth right gaze deviation noted, patient able to barely cross midline with much stimulation. EEG to evaluate for seizure  Level of alertness: Awake  AEDs during EEG study: None  Technical aspects: This EEG study was done with scalp electrodes positioned according to the 10-20 International system of electrode placement. Electrical activity was reviewed with band pass filter of 1-70Hz , sensitivity of 7 uV/mm, display speed of 18mm/sec with a 60Hz  notched filter applied as appropriate. EEG data were recorded continuously and digitally stored.  Video monitoring was available and reviewed as appropriate.  Description: The posterior dominant rhythm consists of 6 Hz activity of moderate voltage (25-35 uV) seen predominantly in posterior head regions, symmetric and reactive to eye opening and eye closing. EEG showed continuous generalized and lateralized right hemisphere 3 to 6 Hz theta-delta slowing. Physiologic photic driving was not seen during photic stimulation. Hyperventilation was not performed.     ABNORMALITY - Continuous slow, generalized and lateralized right hemisphere  IMPRESSION: This study is is suggestive of cortical dysfunction arising from right hemisphere likely secondary to underlying stroke. Additionally there is  moderate diffuse encephalopathy. No seizures or epileptiform discharges were seen throughout the recording.   Magaret Justo Annabelle Harman

## 2023-08-20 NOTE — Progress Notes (Addendum)
Hypoglycemic Event  CBG: 36  Treatment: D50 IV ,50 ml. Refused to drink.   Symptoms: Sweaty  Follow-up CBG: Time: 0405 CBG Result: 111  Care ongoing.   04:59 AM CBG 54 S/s: shaky Treatment: D50 IV- only -lost PIV.                    Glutose oral gel 40%- 1 Tube.   1 cup orange juice- he was able to finish FF up cbg  05:35 am - 75.     Gave the remaining D50, new PIV  FF up. CBG  0600- 75. -DR Loney Loh was made aware.

## 2023-08-20 NOTE — Progress Notes (Signed)
Pt BG 22, given amp of D50, and peaches. Recheck BG 110. MD [facility] notified. New orders to hold insulin.

## 2023-08-20 NOTE — Progress Notes (Signed)
Patient refusing to eat or drink anything to help get blood sugar up. 1 amp of D50 given.

## 2023-08-20 NOTE — Progress Notes (Signed)
RT attempted ABG but Pt not available at this time. 2C RT will try again at a later time if schedule permits.

## 2023-08-20 NOTE — Progress Notes (Signed)
Hypoglycemic Event  CBG: 49   Treatment: D50 50 mL (25 gm)  Symptoms: Shaky  Follow-up CBG: Time: 0919 CBG Result:151   Possible Reasons for Event: Inadequate meal intake  Comments/MD notified:MD Natale Milch made aware. New orders for 1 amp of D50.     Carie Caddy

## 2023-08-20 NOTE — Progress Notes (Signed)
SLP Cancellation Note  Patient Details Name: [name] MRN: [id number] DOB: 07-Sep-1981   Cancelled treatment:    [name] is lethargic.  Has not been eating despite advancing diet to regular solids.  Attempted to call his mother to talk about his swallowing issues, poor PO intake.  There was no answer.    SLP will continue to follow.  Vernie Piet L. Samson Frederic, MA CCC/SLP Clinical Specialist - Acute Care SLP Acute Rehabilitation Services Office number [phone]        [name] 08/20/2023, 12:36 PM

## 2023-08-20 NOTE — Plan of Care (Signed)
  Problem: Safety: Goal: Ability to remain free from injury will improve Outcome: Progressing   

## 2023-08-20 NOTE — Progress Notes (Addendum)
PROGRESS NOTE    Shane CHROMY  [name]:[id number] DOB: November 21, 1980 DOA: 07/09/2023 PCP: [name], NP   Brief Narrative:  [name] is an 42 y.o. male past medical history of end-stage renal disease on hemodialysis Monday Wednesday Friday, insulin-dependent diabetes mellitus, gastroparesis comes into close is: 07/08/2022 with altered mental status, she was recently seen in the the hospital on 04/09/2023 for sepsis with diabetic foot cultures grew MRSA during that time patient had catheter removed and replaced ID was consulted who recommended IV vancomycin.  Patient refused echo and left AGAINST MEDICAL ADVICE on 04/14/2023 was found confused by family members on 07/09/2023 brought into the ED and found to be encephalopathic with agitation, her blood glucose was 500 started on IV fluids and IV insulin for DKA CT of the head showed no acute findings.  CT scan of the abdomen and pelvis showed no acute findings but it did show hepatic steatosis, with a liver abnormality 7 x 4 cm septic with a white count of 20 was fluid resuscitated and started empirically on IV Vanco and Zosyn also.  Her blood pressure became soft PCCM was consulted for an ICU admission as she became hypoxic and worsen over night on 07/19/2023.  Hospital course 10/12 - Altered mental status, DKA 10/12 - Blood cultures positive for MRSA 10/13 - TTE LVEF 25-30% w/ severely depressed LVF, global hypokinesis, gd I diastolic dysfxn, mod RV fxn reduction. Possible filamentous structure on MV 10/16 - TEE LVEF 30-35%RV fxn nml. Noted masses on the mitral and tricuspid valves 10/16 - Seen by Cardiothoracic surg. Felt poor candidate. Recommended 6 weeks abx and recommended repeat TEE 1 week 10/17 - Acute right MCA infarct - left occipital and bilateral cerebellum felt c/w septic emboli. Neuro consulted.  10/18 - Tunneled left femoral vasc cath placed after catheter holiday  10/20 - 21 Worsening lethargy/confusion intubated overnight  20/21st 10/22 - TEE LVEF 35%, LV global hypokinesis. Very large mitral vegetation(mobile) - prolapsing into LV.  Increased pressors required. 10/23- per nursing less responsive than yesterday, increased ETT secretions. Clotting off CRRT. Ct head showing expected evolution of the right MCA stroke.  Cardiothoracic surgery re-consulted for worsening MVE. Felt too high risk here. Recommended referral to tertiary care  10/24 agitated when sedation decreased, is purposeful but does not follow commands. Tolerating UF gaols of -150. Still on NE infusion but extubated later that afternoon after passing SBT 10/25 failed swallow eval still on just 3 lpm O2. Encephalopathic. Added seroquel in effort to get him off the precedex. Cardiology told no beds at [facility]. Not clear if accepted or not. Worsening pressor requirements. Got blood for hgb 6. Cefepime resumed for clinical decline and leukocytosis  10/26 Hemoglobin again dropped to 6.3 overnight with increased pressor requirement yesterday afternoon now on vaso and levo. 10/27 Hypoglycemic episode overnight, mental status improved a little over weekend  10/28 Getting another unit of blood more awake.  Oriented x 2.  Still on Precedex.  Having liquid stools. 10/30 Off Precedex, low-dose levo.  Plan to transition to IHD 10/29 Cefepime for aspiration course completed.  10/31 Seen by overnight team for hypoxia and somnolence ABG with mild hypercapnia. Again declined by [facility] because they were at capacity. More awake. Doing better w/ Bed side swallow but still likely aspirating. CRRT stopped.  11/4 Remains on low dose levo 11/5-6; extubated, weaned off pressors and transferred to progressive care unit - [facility] assumed care 11/6 11/7-called [facility], at capacity.  Declined transfer 11/12-femoral line  removed; palliative care consulted for goals of care 11/12-[facility] declined transfer due to capacity 11/13 -15 No changes -pursuing LTACH placement - mother refusing PEG placement -  continue full scope of care  11/21-Discontinue tube feeds (nepro/banatrol supplement) - follow for PO intake tolerance for continued dispo planning. Hypoglycemic due to poor PO intake - insulin discontinued/titrated down 11/22-Rapid response early a.m. with worsening mental status, previously hypoglycemic but resolved with p.o. intake. Stat CT shows worsening MCA territory infarct extending to large portion of the frontal and parietal lobes as well as basal ganglia with noted petechial hemorrhage and 9 mm midline shift to the left.  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)   Hemiparesis affecting left side as late effect of stroke ([name])   Dysphagia due to recent stroke   Physical deconditioning   Cavitary pneumonia   Subclavian vein occlusion, bilateral ([name])   Acute blood loss anemia   Fatty liver   Liver mass   Insulin dependent type 1 diabetes mellitus ([name])   Diabetic gastroparesis ([name])   Septic shock ([name])   Anemia of chronic disease   Iron deficiency anemia, unspecified   Secondary hyperparathyroidism of renal origin ([name])   Thrombocytopenia ([name])   Acute metabolic encephalopathy   MRSA bacteremia   Right elbow pain   ESRD on hemodialysis ([name])   Acute systolic CHF (congestive heart failure) ([name])   DCM (dilated cardiomyopathy) ([name])   Coronary artery calcification seen on CAT scan   Infective endocarditis of tricuspid valve   Protein-calorie malnutrition, severe   Hx of AKA (above knee amputation), left ([name])   Cerebrovascular accident (CVA) due to embolism of cerebral artery ([name])   Acute right MCA CVA - expanding with new midline shift 08/20/23 Metabolic encephalopathy with delirium and agitation improving - Stat head CT for acute mental status changes shows expanding MCA territory infarct  with petechial hemorrhage and midline shift (9mm) hold eliquis until cleared by neuro - Neurology reconsulted -okay to transfer back to neurology floor given acute mental status changes. - Previous MRI of the brain on 07/15/2023 showed an M2 occlusion found to have a right MCA territory infarct favored septic emboli. Previously off all sedation per discussion with family as of 08/19/23  Dysphagia: Mod-severe protein caloric malnutrition -Patient continues to have poor p.o. intake per staff, family continues to be somewhat hesitant for PEG tube placement Discontinue tube feeds and follow p.o. intake -will need to discuss permanent avenue for nutrition if patient cannot keep up appropriately on his own.  He will likely need a PEG tube unless p.o. intake improves which is unlikely given new CT head findings -Off narcotics, sedation, CNS depressants now for more than 24 hours, these are not affecting his p.o. intake at this point despite prior concerns from family Per previous team "approached mother with the need for PEG tube and she continues to be unrealistic and saying that we are not allowing him to eat" -Patient (prior to most recent stroke) only eating approximately 7 to 8% of daily caloric needs over the prior 72 hours.  Goals of care: -Overall poor prognosis - in the setting of worsening mental status and large MCA territory infarct -Extensive conversation with patient's mother, power of attorney multiple times over the past week about prognosis and goals of care -Currently has disposition is  limited by poor p.o. intake and malnutrition, he cannot discharge to LTAC given poor nutrition and request for PEG tube placement from their facility has been denied by patient's mother -Multiple attempts to transfer patient to outside facility for further evaluation and care -[facility] and [facility] declined patient transfer.  Septic shock due to infective mitral and tricuspid endocarditis in the  setting of MRSA bacteremia, resolved: Off pressors, on midodrine for dialysis only Has been denied for surgical intervention by [facility], [facility], and [facility]. Will need 6 weeks of vancomycin starting from 08/09/2023 (stop date 09/19/2023) given with dialysis 3 times weekly.   Evolving cavitary pneumonia with mucous plugging: On IV vancomycin per ID recommendations as above   Acute on chronic combined systolic and diastolic heart failure/possible cardiogenic shock: 2D echo showed an EF of 30% and vegetation tricuspid and mitral valve with severe regurgitation. Continues on metoprolol Likely to require surgical intervention of valves, CT surgery was consulted recommended transfer to tertiary center due to high risk of patient procedure. [facility] has declined the patient transferred time to due to capacity, [facility] denied for multiple prohibitive surgical risks Last [facility] denial 08/10/2023   Ongoing sinus tachycardia: Continue metoprolol Off narcotics Currently on oral midodrine.   Lower extremity DVT: Positive on 08/02/2023, holding eliquis.    End-stage renal disease on hemodialysis Monday Wednesday Friday: Nephrology following Status post line holiday without catheter removal on 07/14/2023. IR placed tunneled catheter on 07/16/2023.  Hyperglycemia/DKA/diabetes mellitus type 2, uncontrolled with hyperglycemia A1C 8.9 - continue sliding scale ongoing Off all scheduled insulin - hypoglycemia noted over past 12h - D5,1/2NS ongoing given NPO status and ongoing hypoglycemia today despite boluses.   Anemia/thrombocytopenia due to critical illness: Status post 5 units of packed red blood cells, hemoglobin has remains stable.   Incidental liver mass: Seen on CT scan of the abdomen pelvis 7 x 4 cm anterior liver mass. Will need MRI as an outpatient.   Hyponatremia: Further management per nephrology.    Stage IIc decubitus ulcer Pressure Injury 08/03/23 Buttocks Left  Stage 2 -  Partial thickness loss of dermis presenting as a shallow open injury with a red, pink wound bed without slough. Several small open areas, right buttocks area (Active)  08/03/23 1945  Location: Buttocks  Location Orientation: Left  Staging: Stage 2 -  Partial thickness loss of dermis presenting as a shallow open injury with a red, pink wound bed without slough.  Wound Description (Comments): Several small open areas, right buttocks area  Present on Admission: No (present on transfer to [facility])   DVT prophylaxis: Place and maintain sequential compression device Start: 07/30/23 1415   Code Status:   Code Status: Full Code  Family Communication: Attempted to call Mother and two other listed contacts to update them on his status change multiple times with no answer. Will try again this afternoon.  Status is: Inpt  Dispo: The patient is from: Home              Anticipated d/c is to: LTAC              Anticipated d/c date is: 24-48h              Patient currently IS medically stable for discharge  Consultants:  PCCM, neuro  Antimicrobials:  Vancomycin x6 weeks   Subjective: No acute issue/events overnight  Objective: Vitals:   08/20/23 0908 08/20/23 1056 08/20/23 1057 08/20/23 1548  BP: (!) 103/55 112/70  123/77  Pulse: (!) 127 (!) 123  (!) 120  Resp:  (!) 21 20 19   Temp:  100 F (37.8 C)  98.4 F (36.9 C)  TempSrc:  Oral  Oral  SpO2:  97%  99%  Weight:      Height:       Intake/Output Summary (Last 24 hours) at 08/20/2023 1639 Last data filed at 08/20/2023 1526 Gross per 24 hour  Intake 8104.19 ml  Output --  Net 8104.19 ml   Filed Weights   08/18/23 1623 08/19/23 0605 08/20/23 0345  Weight: 81.2 kg 74.4 kg 74.8 kg   Examination:  General exam: Somnolent but arousable Respiratory system: Clear to auscultation. Respiratory effort normal. Cardiovascular system: S1 & S2 heard, RRR. No JVD, murmurs, rubs, gallops or clicks. No pedal edema. Gastrointestinal  system: Abdomen is nondistended, soft and nontender. No organomegaly or masses felt. Normal bowel sounds heard. Central nervous system: Awake, unable to answer questions appropriately. Extremities: Not following commands, difficult to assess.  Data Reviewed: I have personally reviewed following labs and imaging studies  CBC: Recent Labs  Lab 08/14/23 0224 08/15/23 0219 08/16/23 1615 08/17/23 1053 08/18/23 1533 08/20/23 0815  WBC 10.1 11.3* 10.0 10.5 7.8 20.9*  NEUTROABS 7.8* 8.4* 7.2 8.8* 5.8  --   HGB 8.6* 9.9* 8.7* 10.3* 9.1* 10.2*  HCT 28.1* 32.1* 27.7* 32.5* 29.3* 31.9*  MCV 93.7 93.9 93.9 93.4 93.0 92.2  PLT 355 380 345 350 285 226   Basic Metabolic Panel: Recent Labs  Lab 08/14/23 0224 08/15/23 0219 08/16/23 1615 08/17/23 1053 08/18/23 0214 08/18/23 1245 08/19/23 0237 08/20/23 0222  NA 131* 126* 127* 126*  --  125*  --   --   K 4.0 4.4 4.7 4.5  --  5.6*  --   --   CL 92* 89* 89* 90*  --  87*  --   --   CO2 27 25 26 24   --  23  --   --   GLUCOSE 250* 340* 89 161*  --  401*  --   --   BUN 47* 80* 131* 64*  --  98*  --   --   CREATININE 3.78* 5.37* 7.08* 4.54*  --  6.35*  --   --   CALCIUM 9.5 9.8 9.7 9.0  --  8.8*  --   --   MG 2.5* 2.8* 3.2* 2.5* 2.7*  --  2.5* 2.6*  PHOS 3.2 4.2 5.2* 4.3  --   --   --   --    GFR: Estimated Creatinine Clearance: 14 mL/min (A) (by C-G formula based on SCr of 6.35 mg/dL (H)).  Liver Function Tests: Recent Labs  Lab 08/14/23 0224 08/15/23 0219 08/16/23 1615 08/17/23 1053  AST 18 18 14* 37  ALT 11 11 11 10   ALKPHOS 143* 178* 136* 136*  BILITOT 0.8 0.8 0.8 0.9  PROT 7.7 8.0 7.1 8.2*  ALBUMIN 2.3* 2.5* 2.3* 2.6*   CBG: Recent Labs  Lab 08/20/23 1224 08/20/23 1319 08/20/23 1350 08/20/23 1440 08/20/23 1547  GLUCAP 78 55* 123* 100* 85   No results found for this or any previous visit (from the past 240 hour(s)).   Radiology Studies: CT HEAD WO CONTRAST ( )  Result Date: 08/20/2023 CLINICAL DATA:  Mental  status change, unknown cause EXAM: CT HEAD WITHOUT CONTRAST TECHNIQUE: Contiguous axial images were obtained from the base of the skull through the vertex without intravenous contrast. RADIATION DOSE REDUCTION: This exam was performed according to  the departmental dose-optimization program which includes automated exposure control, adjustment of the mA and/or kV according to patient size and/or use of iterative reconstruction technique. COMPARISON:  CT Head 07/21/23 FINDINGS: Brain: There is extension of the right MCA territory infarct which now involves a large portion of the right frontal and parietal lobes, as well as the right basal ganglia. There is petechial hemorrhage associated with the area of infarct with possible small volume subarachnoid hemorrhage along the lateral aspect. There is increased leftward midline shift measuring 9 mm. No evidence of hydrocephalus at the time of exam. Vascular: There is a punctate hyperdensity centrally in the area of infarct (series 2, image 15), which could represent a thrombosed vessel, unchanged prior. Skull: Normal. Negative for fracture or focal lesion. Sinuses/Orbits: No middle ear effusion. Small right mastoid effusion. Mucosal thickening in the left maxillary sinus. Orbits are unremarkable. Other: None. IMPRESSION: 1. Extension of the right MCA territory infarct which now involves a large portion of the right frontal and parietal lobes, as well as the right basal ganglia. There is petechial hemorrhage associated with the area of infarct with possible small volume subarachnoid hemorrhage along the lateral aspect. 2. Increased leftward midline shift measuring 9 mm. No evidence of hydrocephalus at the time of exam. Electronically Signed   By: Lorenza Cambridge M.D.   On: 08/20/2023 12:29    Scheduled Meds:  calcitRIOL  1.5 mcg Per Tube Q M,W,F   Chlorhexidine Gluconate Cloth  6 each Topical Q0600   darbepoetin (ARANESP) injection - DIALYSIS  100 mcg Subcutaneous Q  Fri-1800   dextrose       feeding supplement  1 Container Oral TID BM   feeding supplement (PROSource TF20)  60 mL Per Tube Daily   insulin aspart  0-9 Units Subcutaneous Q4H   metoprolol tartrate  50 mg Per Tube BID   midodrine  10 mg Per Tube Q M,W,F-HD   multivitamin  1 tablet Per Tube QHS   nutrition supplement (JUVEN)  1 packet Per Tube BID BM   mouth rinse  15 mL Mouth Rinse 4 times per day   Continuous Infusions:  anticoagulant sodium citrate     dextrose 5 % and 0.45 % NaCl 40 mL/hr at 08/20/23 1526   vancomycin      LOS: 41 days   Time spent:  Azucena Fallen, DO [facility]  If 7PM-7AM, please contact night-coverage [email or web address]  08/20/2023, 4:39 PM

## 2023-08-20 NOTE — Progress Notes (Signed)
Pt's aunt Felecia, called and was asking for an update on Pt. This RN updated about patients situation and told that she would get in touch with patients mother.

## 2023-08-20 NOTE — Progress Notes (Signed)
Hypoglycemic Event  CBG: 59   Treatment: D50 50 mL (25 gm)  Symptoms: None  Follow-up CBG: Time:1137 CBG Result:101  Possible Reasons for Event: Inadequate meal intake  Comments/MD notified:MD Natale Milch made aware No new orders     Carie Caddy

## 2023-08-20 NOTE — Progress Notes (Signed)
Patient is transferring to 3W. EEG to be placed when patient is available.

## 2023-08-20 NOTE — Progress Notes (Signed)
Hypoglycemic Event  CBG: 55   Treatment: D50 50 mL (25 gm)  Symptoms: None  Follow-up CBG: Time:1352 CBG Result:123  Possible Reasons for Event: Inadequate meal intake  Comments/MD notified:MD Bess Harvest E Maggy Wyble

## 2023-08-20 NOTE — Progress Notes (Addendum)
STROKE TEAM PROGRESS NOTE   BRIEF HPI [name] is a 42 y.o. male with history of  ESRD on HD MWF, right diabetic foot ulcer complicated by MRSA bacteremia 7/24 likely 2/2 to HD line infection, insulin dependent diabetes, gastroparesis, hx of left AKA, who presents with AMS on 10/11 and found to have septic shock with hypotension, leukocytosis and encephalopathy. He was admitted in July with MRSA bacteremia. He was treated with vancomycin, refused echo and left AMA.  Blood cultures this admission grew MRSA bacteremia. TEE showed large vegetation measuring 2 cm x 0.8 cm on the posterior mitral valve leaflet extending into the mitral valve annulus with high mobility. Also 2 mobile masses on the anterior leaflet of the tricuspid valve and a 3rd mass on the posterior tricuspid valve. He mentioned left arm weakness for some time. MRI completed and showed right MCA infarct with acute infarcts in left occipital and bilateral cerebellum.  On 11/22, patient had worsening mental status and repeat head CT demonstrated worsening right MCA territory infarct with petechial hemorrhage and subarachnoid hemorrhage along with 9 mm midline shift to the right, stroke team was reconsulted.  Hemorrhagic transformation likely occurred in setting of Eliquis use for DVT.  Eliquis should remain stopped at this time.   SIGNIFICANT HOSPITAL EVENTS 10/12 patient admitted with altered mental status and DKA 10/13 patient was found to have endocarditis 10/16 felt poor candidate for cardiac surgery 10/17 patient had acute right MCA infarct, likely due to septic emboli 10/20 intubated and placed on CRRT 10/28 patient now extubated, had multiple episodes of hypoglycemia in past few days 11/13 pursuing LTAC placement but mother refusing PEG tube 11/22 worsening mental status noted and stat head CT demonstrates worsening right MCA territory infarct as well as petechial hemorrhage, subarachnoid hemorrhage and 9 mm midline shift  to the left  INTERIM HISTORY/SUBJECTIVE Rapid response called for worsening mental status this morning.  Repeat CT head demonstrates extension of right MCA territory infarct with petechial hemorrhage and possible small volume subarachnoid hemorrhage.  On exam, patient has right gaze deviation, barely able to cross midline but purposeful movement of right upper extremity, left side flaccid.  Will obtain stat EEG to rule out seizures in setting of previous stroke. Do not see need for ICU transfer given that patient is awake and she does not exhibit signs of increased ICP such as posturing.  Will hold off on MRI for now, but will obtain if this would help discussions with family.  Origin transformation likely occurred in setting of patient being on Eliquis for DVT, this has been discontinued and should not be resumed at this time.  OBJECTIVE  CBC    Component Value Date/Time   WBC 20.9 (H) 08/20/2023 0815   RBC 3.46 (L) 08/20/2023 0815   HGB 10.2 (L) 08/20/2023 0815   HGB 9.7 (L) 08/22/2014 0447   HCT 31.9 (L) 08/20/2023 0815   HCT 30.0 (L) 08/22/2014 0447   PLT 226 08/20/2023 0815   PLT 158 08/22/2014 0447   MCV 92.2 08/20/2023 0815   MCV 97 08/22/2014 0447   MCH 29.5 08/20/2023 0815   MCHC 32.0 08/20/2023 0815   RDW 17.2 (H) 08/20/2023 0815   RDW 15.3 (H) 08/22/2014 0447   LYMPHSABS 1.0 08/18/2023 1533   LYMPHSABS 1.7 08/22/2014 0447   MONOABS 0.6 08/18/2023 1533   MONOABS 0.8 08/22/2014 0447   EOSABS 0.4 08/18/2023 1533   EOSABS 0.4 08/22/2014 0447   BASOSABS 0.0 08/18/2023 1533  BASOSABS 0.1 08/22/2014 0447    BMET    Component Value Date/Time   NA 125 (L) 08/18/2023 1245   NA 138 08/22/2014 0447   K 5.6 (H) 08/18/2023 1245   K 4.0 08/22/2014 0447   CL 87 (L) 08/18/2023 1245   CL 100 08/22/2014 0447   CO2 23 08/18/2023 1245   CO2 30 08/22/2014 0447   GLUCOSE 401 (H) 08/18/2023 1245   GLUCOSE 88 08/22/2014 0447   BUN 98 (H) 08/18/2023 1245   BUN 14 08/22/2014 0447    CREATININE 6.35 (H) 08/18/2023 1245   CREATININE 5.00 (H) 08/22/2014 0447   CALCIUM 8.8 (L) 08/18/2023 1245   CALCIUM 8.2 (L) 08/22/2014 0447   GFRNONAA 10 (L) 08/18/2023 1245   GFRNONAA 14 (L) 08/22/2014 0447   GFRNONAA 5 (L) 06/19/2014 0937    IMAGING past 24 hours CT HEAD WO CONTRAST ( )  Result Date: 08/20/2023 CLINICAL DATA:  Mental status change, unknown cause EXAM: CT HEAD WITHOUT CONTRAST TECHNIQUE: Contiguous axial images were obtained from the base of the skull through the vertex without intravenous contrast. RADIATION DOSE REDUCTION: This exam was performed according to the departmental dose-optimization program which includes automated exposure control, adjustment of the mA and/or kV according to patient size and/or use of iterative reconstruction technique. COMPARISON:  CT Head 07/21/23 FINDINGS: Brain: There is extension of the right MCA territory infarct which now involves a large portion of the right frontal and parietal lobes, as well as the right basal ganglia. There is petechial hemorrhage associated with the area of infarct with possible small volume subarachnoid hemorrhage along the lateral aspect. There is increased leftward midline shift measuring 9 mm. No evidence of hydrocephalus at the time of exam. Vascular: There is a punctate hyperdensity centrally in the area of infarct (series 2, image 15), which could represent a thrombosed vessel, unchanged prior. Skull: Normal. Negative for fracture or focal lesion. Sinuses/Orbits: No middle ear effusion. Small right mastoid effusion. Mucosal thickening in the left maxillary sinus. Orbits are unremarkable. Other: None. IMPRESSION: 1. Extension of the right MCA territory infarct which now involves a large portion of the right frontal and parietal lobes, as well as the right basal ganglia. There is petechial hemorrhage associated with the area of infarct with possible small volume subarachnoid hemorrhage along the lateral aspect. 2.  Increased leftward midline shift measuring 9 mm. No evidence of hydrocephalus at the time of exam. Electronically Signed   By: Lorenza Cambridge M.D.   On: 08/20/2023 12:29    Vitals:   08/20/23 0345 08/20/23 0908 08/20/23 1056 08/20/23 1057  BP: 135/75 (!) 103/55 112/70   Pulse: 100 (!) 127 (!) 123   Resp: 20  (!) 21 20  Temp: 98.3 F (36.8 C)  100 F (37.8 C)   TempSrc: Oral  Oral   SpO2: 94%  97%   Weight: 74.8 kg     Height:         PHYSICAL EXAM Constitutional: Ill-appearing diaphoretic patient with left AKA in no acute distress Cardiovascular: Normal rate and regular rhythm.  Respiratory: Effort normal, non-labored breathing.    NEURO:  Patient is nonverbal and unable to follow commands.  He will localize sternal rub with his right upper extremity and moves right upper and lower extremities spontaneously, left upper and lower extremities flaccid.  Right gaze deviation noted, patient able to barely cross midline with much stimulation.  Pupils equal round and reactive, face symmetrical, sensation appears to be intact on the right  but diminished to absent on the left.   ASSESSMENT/PLAN  Stroke: Large right MCA infarct and small left occipital lobe and bilateral cerebellum infarcts,  now with extension of right MCA territory infarct into right frontal and parietal lobes as well as right basal ganglia with petechial hemorrhage. etiology:  embolic in the setting of endocarditis with hemorrhagic transformation likely caused by Eliquis use CT head No acute abnormality, motion degraded   MRI  Sizable right MCA branch infarct affecting the upper division. Small acute infarcts in the left occipital lobe and bilateral cerebellum. Petechial hemorrhage is present without hematoma. No worrisome mass effect. Repeat CT scan 11/22: Extension of right MCA territory infarct now involving large portion of right frontal and parietal lobes as well as right basal ganglia with petechial hemorrhage and  possible small volume subarachnoid hemorrhage MRA  Poor signal in a long segment of a right M2 branch, which is favored to be artifactual, but could represent a true stenosis or occlusion Carotid Doppler unremarkable 2D Echo EF 25-30% abnormal mitral valve TEE: EF 30-35%. Large mass on posterior mitral leaflet.  2 mobile masses on the anterior leaflet of the tricuspid valve with perforation.  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 Large mass on posterior mitral leaflet.  2 mobile masses on the anterior leaflet of the tricuspid valve with perforation.  Another mobile mass on the posterior tricuspid leaflet Management per primary team and cardiology.  CT surgery recommends conservative management with antibiotics Patient is not a candidate for CT surgery On vancomycin  MRSA bacteremia Line holiday completed ID following  Abx: Vancomycin  ESRD on HD MWF HD cath replaced Nephrology following  Congestive Heart Failure Hypotensive BP goal is normotension/per cardiology  SBP currently 90-110  during hospitalization  Left upper extremity DVT On 11/4, patient noted to have a DVT in left internal jugular vein, left subclavian vein and left axillary vein Patient was on Eliquis for this, but this must be discontinued in setting of hemorrhagic transformation of stroke  Diabetes type II Uncontrolled Home meds:  Novolin HgbA1c 8.9, goal < 7.0 CBGs SSI Recommend close follow-up with PCP for better DM control  Dysphagia Patient has post-stroke dysphagia, SLP consulted Passed swallow and on dys 3 diet and thin liquid Advance diet as tolerated  Other acute issues Hyponatremia,  sodium 131--125 Leukocytosis WBC 20.5--15.3--13.7--20.9 Left internal jugular, subclavian and axillary vein thrombosis   Hospital day # 41  Patient seen by NP and then by MD, MD to edit note as needed.  Cortney E Ernestina Columbia , MSN, AGACNP-BC [facility] See Amion for schedule and pager information 08/20/2023 3:50 PM  I have personally obtained history,examined this patient, reviewed notes, independently viewed imaging studies, participated in medical decision making and plan of care.ROS completed by me personally and pertinent positives fully documented  I have made any additions or clarifications directly to the above note. Agree with note above.  Patient known to stroke service from previous consultation for right MCA infarct last month secondary to endocarditis.  Patient was on anticoagulation with Eliquis for 5 left internal jugular vein thrombosis and now developed altered mental status yesterday.  Repeat CT scan demonstrated extension of the right MCA infarct with petechial hemorrhage and possible small subarachnoid  hemorrhage likely due to being on Eliquis.  Patient is not a candidate for hypertonic saline or hemicraniectomy given his poor general medical condition and renal failure.  Recommend continue to hold Eliquis.  Prognosis is very poor.  Check EEG for seizure activity.  Do not feel transfer to ICU and more aggressive neuro ICU care would be appropriate.  No family available at the bedside for discussion.  Discussed with bedside RN and Dr. Natale Milch.  Greater than 50% time during this 50-minute visit were spent on counseling and coordination of care and discussion patient and care team and answering questions.  Delia Heady, MD Medical Director [facility] Pager: [phone] 08/20/2023 6:33 PM  To contact Stroke Continuity provider, please refer to [email or web address]. After hours, contact General Neurology

## 2023-08-20 NOTE — Progress Notes (Signed)
[facility] Progress Note  Subjective: seen in room, no c/o's. BS's are low this am but he is refusing to take liquids to get it up.   Vitals:   08/19/23 2029 08/19/23 2334 08/20/23 0345 08/20/23 0908  BP: 102/73 (!) 142/78 135/75 (!) 103/55  Pulse: (!) 105 99 100 (!) 127  Resp:   20   Temp: 98.4 F (36.9 C) 98.2 F (36.8 C) 98.3 F (36.8 C)   TempSrc: Oral Oral Oral   SpO2: 96% 94% 94%   Weight:   74.8 kg   Height:        Exam: Gen: alert, NG tube on  CVS: Regular tachycardia, S1 and S2 Resp: Transmitted breath sounds, no distinct rales or rhonchi, [place] O2 Abd: Soft, flat, nontender, bowel sounds normal Ext: Status post left AKA  LUE edema, no other edema Access: Left femoral TDC in place    Renal-related home meds: - sensipar 120 at bedtime - gabapentin 100mg  - renvavite - renvela 3 ac tid    OP HD: MWF [facility] 4h  400/1.5    77kg   2/3 bath  [facility] TDC    Heparin none - last OP HD 10/7, post wt 74.6kg - misses HD once every other week approx - rocaltrol 1.50 mcg    Assessment/ Plan: MRSA bacteremia w/ MV endocarditis: due to HD cath infection. SP line holiday w/ old TDC removed 10/16 and new TDC placed 10/18 by IR.  Needs IV vanc for 6 wks thru 09/20/23.  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  ESRD: on HD MWF. SP CRRT 10/22- 10/30, back on iHD now. HD friday Volume: [place] 2L O2 dc'd on 11/10, remains on RA. Euvolemic on exam except LUE edema. New dry wt about 73- 75kg.  Anemia: Hb 7- 9 range. ESA dose increased to 100 mcg q. weekly. 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 Monitor on renal diet.  On calcitriol for PTH control. Nutrition: cortrak in place, getting tube feeds, not eating much per RN Hypotension: Weaned off of pressors and off midodrine HD access: SP line holiday with new L  femoral TDC placed on 10/18. Pt has occlusion of bilateral subclavian veins limiting his options for  dialysis access. Dispo: he successfully dialyzed in a chair 11/18. Will not need to do this again. Ok for dc to SNF.      Vinson Moselle MD  [facility] 08/20/2023, 10:26 AM  Recent Labs  Lab 08/16/23 1615 08/17/23 1053 08/18/23 1245 08/18/23 1533 08/20/23 0815  HGB 8.7* 10.3*  --  9.1* 10.2*  ALBUMIN 2.3* 2.6*  --   --   --   CALCIUM 9.7 9.0 8.8*  --   --   PHOS 5.2* 4.3  --   --   --   CREATININE 7.08* 4.54* 6.35*  --   --   K 4.7 4.5 5.6*  --   --    No results for input(s): "IRON", "TIBC", "FERRITIN" in the last 168 hours. Inpatient medications:  apixaban  5 mg Per Tube BID   calcitRIOL  1.5 mcg Per Tube Q M,W,F   Chlorhexidine Gluconate Cloth  6 each Topical Q0600   Chlorhexidine Gluconate Cloth  6 each Topical Q0600   darbepoetin (ARANESP) injection - DIALYSIS  100 mcg Subcutaneous Q Fri-1800   dextrose       feeding supplement  1 Container Oral TID BM   feeding supplement (PROSource TF20)  60  mL Per Tube Daily   insulin aspart  0-9 Units Subcutaneous Q4H   metoprolol tartrate  50 mg Per Tube BID   midodrine  10 mg Per Tube Q M,W,F-HD   multivitamin  1 tablet Per Tube QHS   nutrition supplement (JUVEN)  1 packet Per Tube BID BM   mouth rinse  15 mL Mouth Rinse 4 times per day    anticoagulant sodium citrate     vancomycin     acetaminophen (TYLENOL) oral liquid 160 mg/5 mL, albuterol, alteplase, anticoagulant sodium citrate, dextrose, feeding supplement (NEPRO CARB STEADY), Gerhardt's butt cream, heparin, heparin, heparin sodium (porcine), lidocaine (PF), lidocaine-prilocaine, lip balm, loperamide HCl, mouth rinse, pentafluoroprop-tetrafluoroeth

## 2023-08-21 DIAGNOSIS — I63411 Cerebral infarction due to embolism of right middle cerebral artery: Secondary | ICD-10-CM

## 2023-08-21 DIAGNOSIS — I059 Rheumatic mitral valve disease, unspecified: Secondary | ICD-10-CM | POA: Diagnosis not present

## 2023-08-21 DIAGNOSIS — E1010 Type 1 diabetes mellitus with ketoacidosis without coma: Secondary | ICD-10-CM | POA: Diagnosis not present

## 2023-08-21 LAB — CBC
HCT: 31.3 % — ABNORMAL LOW (ref 39.0–52.0)
HCT: 27.9 % — ABNORMAL LOW (ref 39.0–52.0)
Hemoglobin: 9.9 g/dL — ABNORMAL LOW (ref 13.0–17.0)
Hemoglobin: 8.8 g/dL — ABNORMAL LOW (ref 13.0–17.0)
MCH: 29.1 pg (ref 26.0–34.0)
MCH: 29.2 pg (ref 26.0–34.0)
MCHC: 31.6 g/dL (ref 30.0–36.0)
MCHC: 31.5 g/dL (ref 30.0–36.0)
MCV: 92.1 fL (ref 80.0–100.0)
MCV: 92.7 fL (ref 80.0–100.0)
Platelets: 159 10*3/uL (ref 150–400)
Platelets: 186 10*3/uL (ref 150–400)
RBC: 3.4 MIL/uL — ABNORMAL LOW (ref 4.22–5.81)
RBC: 3.01 MIL/uL — ABNORMAL LOW (ref 4.22–5.81)
RDW: 16.7 % — ABNORMAL HIGH (ref 11.5–15.5)
RDW: 16.5 % — ABNORMAL HIGH (ref 11.5–15.5)
WBC: 21.8 10*3/uL — ABNORMAL HIGH (ref 4.0–10.5)
WBC: 21.1 10*3/uL — ABNORMAL HIGH (ref 4.0–10.5)
nRBC: 0 % (ref 0.0–0.2)
nRBC: 0 % (ref 0.0–0.2)

## 2023-08-21 LAB — GLUCOSE, CAPILLARY
Glucose-Capillary: 101 mg/dL — ABNORMAL HIGH (ref 70–99)
Glucose-Capillary: 129 mg/dL — ABNORMAL HIGH (ref 70–99)
Glucose-Capillary: 136 mg/dL — ABNORMAL HIGH (ref 70–99)
Glucose-Capillary: 149 mg/dL — ABNORMAL HIGH (ref 70–99)
Glucose-Capillary: 157 mg/dL — ABNORMAL HIGH (ref 70–99)

## 2023-08-21 LAB — MAGNESIUM: Magnesium: 2.8 mg/dL — ABNORMAL HIGH (ref 1.7–2.4)

## 2023-08-21 MED ORDER — CHLORHEXIDINE GLUCONATE CLOTH 2 % EX PADS: 6.0000 | MEDICATED_PAD | Freq: Every day | CUTANEOUS | Status: DC

## 2023-08-21 MED ORDER — CHLORHEXIDINE GLUCONATE CLOTH 2 % EX PADS
6.0000 | MEDICATED_PAD | CUTANEOUS | Status: DC
  Administered 2023-08-21 – 2023-08-26 (×5): 6 via TOPICAL

## 2023-08-21 MED ORDER — ORAL CARE MOUTH RINSE: 15.0000 mL | OROMUCOSAL | Status: DC

## 2023-08-21 MED ORDER — ORAL CARE MOUTH RINSE
15.0000 mL | OROMUCOSAL | Status: DC | PRN
Start: 2023-08-21 — End: 2023-08-21

## 2023-08-21 MED ORDER — ORAL CARE MOUTH RINSE
15.0000 mL | OROMUCOSAL | Status: DC
  Administered 2023-08-22 – 2023-08-27 (×19): 15 mL via OROMUCOSAL

## 2023-08-21 MED ORDER — NEPRO/CARBSTEADY PO LIQD
1000.0000 mL | ORAL | Status: DC
  Filled 2023-08-21: qty 1000

## 2023-08-21 MED ORDER — VANCOMYCIN HCL 500 MG/100ML IV SOLN
500.0000 mg | INTRAVENOUS | Status: AC
  Administered 2023-08-21: 500 mg via INTRAVENOUS
  Filled 2023-08-21: qty 100

## 2023-08-21 MED ORDER — ORAL CARE MOUTH RINSE: 15.0000 mL | OROMUCOSAL | Status: DC | PRN

## 2023-08-21 NOTE — Evaluation (Signed)
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 Filetted toe flap metatarsal resection. Debridement Plantar Foot wound;  Surgeon: Park Liter, DPM;  Location: [facility] OR;  Service: Podiatry;  Laterality: Right;   AMPUTATION Left 08/20/2018   Procedure: FIFTH METATARSAL BONE BIOPSY;  Surgeon: Park Liter, DPM;  Location: [facility] OR;  Service: Podiatry;  Laterality: Left;   AMPUTATION Left 10/28/2018   Procedure: LEFT GREAT TOE AMPUTATION;  Surgeon: Park Liter, DPM;  Location: [facility] OR;  Service: Podiatry;  Laterality: Left;   AMPUTATION Left 09/16/2020   Procedure: AMPUTATION BELOW KNEE;  Surgeon: Larina Earthly, MD;  Location: [facility] OR;  Service: Vascular;  Laterality: Left;   AMPUTATION Left 10/15/2020   Procedure: AMPUTATION ABOVE KNEE;  Surgeon: Sherren Kerns, MD;  Location: [facility] OR;  Service: Vascular;  Laterality: Left;   APPLICATION OF WOUND VAC  02/02/2018   Procedure: APPLICATION OF WOUND VAC  Right Foot;  Surgeon: Park Liter, DPM;  Location: [facility] OR;  Service: Podiatry;;   APPLICATION OF WOUND VAC Left 10/28/2018   Procedure: APPLICATION OF WOUND VAC LEFT TOE;  Surgeon: Park Liter,  DPM;  Location: [facility] OR;  Service: Podiatry;  Laterality: Left;   APPLICATION OF WOUND VAC Left 11/01/2018   Procedure: APPLICATION OF WOUND VAC;  Surgeon: Park Liter, DPM;  Location: [facility] OR;  Service: Podiatry;  Laterality: Left;   AV FISTULA PLACEMENT     left arm.   AV FISTULA PLACEMENT Right 12/22/2016   Procedure: INSERTION OF ARTERIOVENOUS (AV) GORE-TEX GRAFT ARM;  Surgeon: Sherren Kerns, MD;  Location: [facility] OR;  Service: Vascular;  Laterality: Right;   AV FISTULA PLACEMENT Left 05/26/2018   Procedure: INSERTION OF  ARTERIOVENOUS (AV) GORE-TEX GRAFT LEFT ARM;  Surgeon: Nada Libman, MD;  Location: [facility] OR;  Service: Vascular;  Laterality: Left;   EYE SURGERY     I & D EXTREMITY Right 10/31/2017   Procedure: IRRIGATION AND DEBRIDEMENT RIGHT FOOT;  Surgeon: Park Liter, DPM;  Location: [facility] OR;  Service: Podiatry;  Laterality: Right;   I & D EXTREMITY Left 08/20/2018   Procedure: IRRIGATION AND DEBRIDEMENT EXTREMITY WITH SECONDARY WOUND CLOSUREAND APPLICATION OF WOUND VAC LEFT FOOT;  Surgeon: Park Liter, DPM;  Location: [facility] OR;  Service: Podiatry;  Laterality: Left;   I & D EXTREMITY Left 10/20/2018   Procedure: IRRIGATION AND DEBRIDEMENT LEFT FOOT  DEBRIDEMENT LATERAL FOOT WOUND;  Surgeon: Park Liter, DPM;  Location: [facility] OR;  Service: Podiatry;  Laterality: Left;   I & D EXTREMITY Left 10/28/2018   Procedure: IRRIGATION AND DEBRIDEMENT LEFT TOE;  Surgeon: Park Liter,  DPM;  Location: [facility] OR;  Service: Podiatry;  Laterality: Left;   I & D EXTREMITY Left 09/14/2020   Procedure: IRRIGATION AND DEBRIDEMENT WRIST;  Surgeon: Betha Loa, MD;  Location: [facility] OR;  Service: Orthopedics;  Laterality: Left;   INSERTION OF DIALYSIS CATHETER     Right subclavian   IR AV DIALY SHUNT INTRO NEEDLE/INTRACATH INITIAL W/PTA/IMG RIGHT Right 02/05/2018   IR FLUORO GUIDE CV LINE LEFT  04/14/2023   IR FLUORO GUIDE CV LINE LEFT  07/16/2023   IR FLUORO GUIDE CV LINE RIGHT  01/31/2020   IR FLUORO  GUIDE CV LINE RIGHT  01/30/2022   IR PTA VENOUS EXCEPT DIALYSIS CIRCUIT  01/30/2022   IR REMOVAL TUN CV CATH W/O FL  04/12/2023   IR THROMBECTOMY AV FISTULA W/THROMBOLYSIS/PTA INC/SHUNT/IMG LEFT Left 08/24/2018   IR THROMBECTOMY AV FISTULA W/THROMBOLYSIS/PTA INC/SHUNT/IMG LEFT Left 01/06/2019   IR US GUIDE VASC ACCESS LEFT  08/24/2018   IR US GUIDE VASC ACCESS LEFT  04/14/2023   IR US GUIDE VASC ACCESS LEFT  07/16/2023   IR US GUIDE VASC ACCESS LEFT  07/16/2023   IR US GUIDE VASC ACCESS RIGHT  02/05/2018   IR US GUIDE VASC ACCESS RIGHT  01/31/2020   IR US GUIDE VASC ACCESS RIGHT  07/16/2023   IR VENO/JUGULAR LEFT  07/16/2023   IR VENO/JUGULAR RIGHT  07/16/2023   IR VENOCAVAGRAM SVC  01/30/2022   IRRIGATION AND DEBRIDEMENT FOOT Right 10/23/2018   Procedure: Irrigation and Debridement to tendon, Left Foot;  Surgeon: Park Liter, DPM;  Location: [facility] OR;  Service: Podiatry;  Laterality: Right;   IRRIGATION AND DEBRIDEMENT FOOT Left 11/01/2018   Procedure: IRRIGATION AND DEBRIDEMENT PARTIAL WOUND CLOSURE LOCAL TISSUE TRANSFER AND FLAP ROTATION, LEFT FOOT;  Surgeon: Park Liter, DPM;  Location: [facility] OR;  Service: Podiatry;  Laterality: Left;   TRANSESOPHAGEAL ECHOCARDIOGRAM (CATH LAB) N/A 07/14/2023   Procedure: TRANSESOPHAGEAL ECHOCARDIOGRAM;  Surgeon: Thomasene Ripple, DO;  Location: [facility] INVASIVE CV LAB;  Service: Cardiovascular;  Laterality: N/A;   TRANSMETATARSAL AMPUTATION N/A 08/18/2018   Procedure: IRRIGATION AND DEBRIDEMENT OF LEFT 5TH TOE AND TRANSMETATARSAL, WITH PARTICAL LEFT 5TH TOE AND METATARSAL AMPUTATION, BONE BIOPSY, WOUND VAC APPLICATION.;  Surgeon: Park Liter, DPM;  Location: [facility] OR;  Service: Podiatry;  Laterality: N/A;   UPPER EXTREMITY VENOGRAPHY N/A 11/16/2016   Procedure: Upper Extremity Venography - Right Central;  Surgeon: Sherren Kerns, MD;  Location: [facility] INVASIVE CV LAB;  Service: Cardiovascular;  Laterality: N/A;   UPPER EXTREMITY VENOGRAPHY N/A 05/25/2018   Procedure: UPPER  EXTREMITY VENOGRAPHY - Bilateral;  Surgeon: Cephus Shelling, MD;  Location: [facility] INVASIVE CV LAB;  Service: Cardiovascular;  Laterality: N/A;   HPI:  Pt is a 42 yo male presenting 10/11 with AMS. His dialysis catheter was partially removed and he was in DKA. Admitted for septic shock from MRSA bacteremia; infective endocarditis. LUE weakness was noted 10/17 and MRI confirmed sizable infarct in the R MCA branch as well as small acute infarcts in the L occipital lobe and bilateral cerebellum. Pt initially evaluated by SLP 10/18 and recommended to have Dys 3 diet and thin liquids due primarily oral symptoms. Pt had worsening hypoxia and AMS after vomiting on 10/21 requiring intubation 10/21-10/24. 10/22-10/30 CRRT. 11/08 MBS, dysphagia 1/nectar. 11/22 neuro changes with new head CT showing "1. Extension of the right MCA territory infarct which now involves a large portion of the right frontal and parietal lobes, as well as the right basal ganglia. There  is petechial hemorrhage associated  with the area of infarct with possible small volume subarachnoid  hemorrhage along the lateral aspect.  2. Increased leftward midline shift measuring 9 mm. No evidence of  hydrocephalus at the time of exam."  PMH includes: ESRD, DM, gastroparesis, L AKA, HTN, recent admission in July for sepsis secondary to R diabetic foot ulcer (left AMA)    Assessment / Plan / Recommendation  Clinical Impression  Pt seen for swallowing re-assessment in setting of neurologic changes and new stroke extension.  Head CT 11/22: "1. Extension of the right MCA territory infarct which now involves a large portion of the right frontal and parietal lobes, as well as the right basal ganglia. There is petechial hemorrhage associated with the area of infarct with possible small volume subarachnoid hemorrhage along the lateral aspect.  2. Increased leftward midline shift measuring 9 mm. No evidence of hydrocephalus at the time of exam."  Pt is eager to  resume PO intake.  He is logorrheic and difficult to redirect.  Pt took very small amounts of NTL by straw.  He reported he did not like the drink and would prefer apple or grape juice.  Pt with improved intake with NTL apple juice.  There was a delayed cough and wet vocal quality following serial sips.  Pt tolerated single sips without overt s/s of aspiration. Pt exhibited good oral clearance and no clinical s/s of aspiration with puree texture.  With regular solids (graham cracker and graham cracker with peanut butter) there was L anterior spillage. Pt exhibited adequate oral clearance.  There was increasing throat clear with these trials.  Pt denied he had cleared his throat immediately following these episodes.  Pt states he would like be allowed again to eat whatever he wants; however, his PO intake was fairly limited prior to this change.  Pt now with change to clinical presentation and imaging which reflects increased risk of aspiration with new subcortical/basal ganglia involvement. SLP to follow for PO readiness v repeat instrumental early next week.  Pt has cortrak in place.    Recommend pt remain NPO with alternate means of nutrition, hydration, and medication.  Pt can have puree snacks from floor stock and single sips of nectar thick liquid for comfort. Please provide full supervision with POs.   SLP Visit Diagnosis: Dysphagia, oropharyngeal phase (R13.12)    Aspiration Risk  Moderate aspiration risk;Risk for inadequate nutrition/hydration    Diet Recommendation NPO;Alternative means - temporary    Medication Administration: Via alternative means    Other  Recommendations Oral Care Recommendations: Oral care QID;Oral care before and after PO;Oral care prior to ice chip/H20    Recommendations for follow up therapy are one component of a multi-disciplinary discharge planning process, led by the attending physician.  Recommendations may be updated based on patient status, additional  functional criteria and insurance authorization.  Follow up Recommendations Skilled nursing-short term rehab (<3 hours/day)      Assistance Recommended at Discharge  N/A  Functional Status Assessment Patient has had a recent decline in their functional status and demonstrates the ability to make significant improvements in function in a reasonable and predictable amount of time.  Frequency and Duration min 2x/week  2 weeks       Prognosis Prognosis for improved oropharyngeal function: Good Barriers to Reach Goals: Cognitive deficits      Swallow Study   General Date of Onset: 07/09/23 HPI: Pt is a 42 yo male presenting 10/11 with AMS. His dialysis  catheter was partially removed and he was in DKA. Admitted for septic shock from MRSA bacteremia; infective endocarditis. LUE weakness was noted 10/17 and MRI confirmed sizable infarct in the R MCA branch as well as small acute infarcts in the L occipital lobe and bilateral cerebellum. Pt initially evaluated by SLP 10/18 and recommended to have Dys 3 diet and thin liquids due primarily oral symptoms. Pt had worsening hypoxia and AMS after vomiting on 10/21 requiring intubation 10/21-10/24. 10/22-10/30 CRRT. 11/08 MBS, dysphagia 1/nectar. 11/22 neuro changes with new head CT showing "1. Extension of the right MCA territory infarct which now involves a large portion of the right frontal and parietal lobes, as well as the right basal ganglia. There is petechial hemorrhage associated  with the area of infarct with possible small volume subarachnoid  hemorrhage along the lateral aspect.  2. Increased leftward midline shift measuring 9 mm. No evidence of  hydrocephalus at the time of exam."  PMH includes: ESRD, DM, gastroparesis, L AKA, HTN, recent admission in July for sepsis secondary to R diabetic foot ulcer (left AMA) Type of Study: Bedside Swallow Evaluation Previous Swallow Assessment: see HPI Diet Prior to this Study: NPO;Cortrak/Small bore NG  tube History of Recent Intubation: Yes Total duration of intubation (days): 1 days Date extubated: 07/22/23 Behavior/Cognition: Alert;Distractible;Requires cueing Oral Care Completed by SLP: No Oral Cavity - Dentition: Adequate natural dentition;Poor condition Vision: Functional for self-feeding Self-Feeding Abilities: Needs set up Patient Positioning: Upright in bed Baseline Vocal Quality: Normal Volitional Cough:  (Fair) Volitional Swallow: Able to elicit    Oral/Motor/Sensory Function Overall Oral Motor/Sensory Function: Mild impairment Facial ROM: Reduced left Facial Symmetry: Abnormal symmetry left Facial Strength: Reduced left Lingual ROM: Within Functional Limits Lingual Symmetry: Within Functional Limits Lingual Strength: Reduced Velum: Within Functional Limits Mandible: Within Functional Limits   Ice Chips Ice chips: Not tested   Thin Liquid Thin Liquid: Not tested    Nectar Thick Nectar Thick Liquid: Impaired Pharyngeal Phase Impairments: Cough - Delayed   Honey Thick Honey Thick Liquid: Not tested   Puree Puree: Within functional limits Presentation: Spoon   Solid     Solid: Impaired Presentation: Self Fed Oral Phase Impairments: Reduced labial seal Oral Phase Functional Implications: Left anterior spillage Pharyngeal Phase Impairments: Throat Clearing - Immediate;Throat Clearing - Delayed      Kerrie Pleasure, MA, CCC-SLP Acute Rehabilitation Services Office: [phone] 08/21/2023,1:21 PM

## 2023-08-21 NOTE — Progress Notes (Signed)
PROGRESS NOTE    [name]  PPI:[id number] DOB: 1981/07/14 DOA: 07/09/2023 PCP: Shane Sessions, NP   Brief Narrative:  [name] is an 42 y.o. male past medical history of end-stage renal disease on hemodialysis Monday Wednesday Friday, insulin-dependent diabetes mellitus, gastroparesis comes into close is: 07/08/2022 with altered mental status, she was recently seen in the the hospital on 04/09/2023 for sepsis with diabetic foot cultures grew MRSA during that time patient had catheter removed and replaced ID was consulted who recommended IV vancomycin.  Patient refused echo and left AGAINST MEDICAL ADVICE on 04/14/2023 was found confused by family members on 07/09/2023 brought into the ED and found to be encephalopathic with agitation, her blood glucose was 500 started on IV fluids and IV insulin for DKA CT of the head showed no acute findings.  CT scan of the abdomen and pelvis showed no acute findings but it did show hepatic steatosis, with a liver abnormality 7 x 4 cm septic with a white count of 20 was fluid resuscitated and started empirically on IV Vanco and Zosyn also.  Her blood pressure became soft PCCM was consulted for an ICU admission as she became hypoxic and worsen over night on 07/19/2023.  Hospital course 10/12 - Altered mental status, DKA 10/12 - Blood cultures positive for MRSA 10/13 - TTE LVEF 25-30% w/ severely depressed LVF, global hypokinesis, gd I diastolic dysfxn, mod RV fxn reduction. Possible filamentous structure on MV 10/16 - TEE LVEF 30-35%RV fxn nml. Noted masses on the mitral and tricuspid valves 10/16 - Seen by Cardiothoracic surg. Felt poor candidate. Recommended 6 weeks abx and recommended repeat TEE 1 week 10/17 - Acute right MCA infarct - left occipital and bilateral cerebellum felt c/w septic emboli. Neuro consulted.  10/18 - Tunneled left femoral vasc cath placed after catheter holiday  10/20 - 21 Worsening lethargy/confusion intubated overnight  20/21st 10/22 - TEE LVEF 35%, LV global hypokinesis. Very large mitral vegetation(mobile) - prolapsing into LV.  Increased pressors required. 10/23- per nursing less responsive than yesterday, increased ETT secretions. Clotting off CRRT. Ct head showing expected evolution of the right MCA stroke.  Cardiothoracic surgery re-consulted for worsening MVE. Felt too high risk here. Recommended referral to tertiary care  10/24 agitated when sedation decreased, is purposeful but does not follow commands. Tolerating UF gaols of -150. Still on NE infusion but extubated later that afternoon after passing SBT 10/25 failed swallow eval still on just 3 lpm O2. Encephalopathic. Added seroquel in effort to get him off the precedex. Cardiology told no beds at [facility]. Not clear if accepted or not. Worsening pressor requirements. Got blood for hgb 6. Cefepime resumed for clinical decline and leukocytosis  10/26 Hemoglobin again dropped to 6.3 overnight with increased pressor requirement yesterday afternoon now on vaso and levo. 10/27 Hypoglycemic episode overnight, mental status improved a little over weekend  10/28 Getting another unit of blood more awake.  Oriented x 2.  Still on Precedex.  Having liquid stools. 10/30 Off Precedex, low-dose levo.  Plan to transition to IHD 10/29 Cefepime for aspiration course completed.  10/31 Seen by overnight team for hypoxia and somnolence ABG with mild hypercapnia. Again declined by [facility] because they were at capacity. More awake. Doing better w/ Bed side swallow but still likely aspirating. CRRT stopped.  11/4 Remains on low dose levo 11/5-6; extubated, weaned off pressors and transferred to progressive care unit - TRH assumed care 11/6 11/7-called [facility], at capacity.  Declined transfer 11/12-femoral line  removed; palliative care consulted for goals of care 11/12-[facility] declined transfer due to capacity 11/13 -15 No changes -pursuing LTACH placement - mother refusing PEG placement -  continue full scope of care  11/21 - Discontinue tube feeds (nepro/banatrol supplement) - follow for PO intake tolerance for continued dispo planning. Hypoglycemic due to poor PO intake - insulin discontinued/titrated down 11/22 - Rapid response early a.m. with worsening mental status - CT shows worsening MCA territory infarct with petechial hemorrhage - anticoagulation held. Neuro consulted. 11/23 - Lengthy discussion with patient's mother [name] - plan to move forward with SLP eval and if unable to tolerate appropriate calorie/fluid intake will plan for PEG later this week.   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)   Hemiparesis affecting left side as late effect of stroke ([name])   Dysphagia due to recent stroke   Physical deconditioning   Cavitary pneumonia   Subclavian vein occlusion, bilateral ([name])   Acute blood loss anemia   Fatty liver   Liver mass   Insulin dependent type 1 diabetes mellitus ([name])   Diabetic gastroparesis ([name])   Septic shock ([name])   Anemia of chronic disease   Iron deficiency anemia, unspecified   Secondary hyperparathyroidism of renal origin ([name])   Thrombocytopenia ([name])   Acute metabolic encephalopathy   MRSA bacteremia   Right elbow pain   ESRD on hemodialysis ([name])   Acute systolic CHF (congestive heart failure) ([name])   DCM (dilated cardiomyopathy) ([name])   Coronary artery calcification seen on CAT scan   Infective endocarditis of tricuspid valve   Protein-calorie malnutrition, severe   Hx of AKA (above knee amputation), left ([name])   Cerebrovascular accident (CVA) due to embolism of cerebral artery ([name])   Acute right MCA CVA - expanding with new midline shift 08/20/23 Metabolic encephalopathy with delirium and agitation improving - Stat head CT for acute mental  status changes shows expanding MCA territory infarct with petechial hemorrhage and midline shift (9mm) hold eliquis until cleared by neuro - Neurology reconsulted - appreciate insight/recs - continue to hold anticoagulation - Previous MRI of the brain on 07/15/2023 favored septic emboli. Off all sedation/narcotics per discussion with family as of 08/19/23  Dysphagia: Mod-severe protein caloric malnutrition -Patient continues to have poor p.o. intake per staff, family continues to be somewhat hesitant for PEG tube placement SLP eval pending - if unable to tolerate appropriate caloric/volume needs will plan for PEG tube placement later this week -Off narcotics, sedation, CNS depressants 08/19/23- these are likely not affecting his p.o. intake at this point despite prior concerns from family  Goals of care: -Overall poor prognosis - in the setting of worsening mental status and large MCA territory infarct with enlarged infarct 11/22 with petechial hemorrhage. -Currently has disposition is limited by poor p.o. intake and malnutrition, plan for PEG placement later this week if caloric/volume status cannot be tolerated PO - Ultimate plan to discharge to LTAC previously hindered by nutritional status -[facility] and [facility] declined patient transfer multiple times  Septic shock due to infective mitral and tricuspid endocarditis in the setting of MRSA bacteremia, resolved: Off pressors, on midodrine for dialysis only Has been denied for surgical intervention by [facility] surgery, [facility], and [facility]. Will need 6 weeks of vancomycin starting from 08/09/2023 (stop date 09/19/2023) given  with dialysis 3 times weekly.   Evolving cavitary pneumonia with mucous plugging: On IV vancomycin per ID recommendations as above   Acute on chronic combined systolic and diastolic heart failure/possible cardiogenic shock: 2D echo showed an EF of 30% and vegetation tricuspid and mitral valve with  severe regurgitation. Continues on metoprolol Likely to require surgical intervention of valves per CT surgery - previously recommended transfer to tertiary center due to high risk of patient procedure. Patient unfortunately continues to be declined by [facility] denied for multiple prohibitive surgical risks Latest [facility] denial 08/10/2023 - call back once PEG placed - unclear if this will change overall trajectory given above  Ongoing sinus tachycardia: Continue metoprolol, Off narcotics Currently on oral midodrine.   Lower extremity DVT: Positive on 08/02/2023, DC eliquis given above petechial bleed on CT 08/20/23    End-stage renal disease on hemodialysis Monday Wednesday Friday: Nephrology following Status post line holiday without catheter removal on 07/14/2023. IR placed tunneled catheter on 07/16/2023.  Hyperglycemia/DKA/diabetes mellitus type 2, uncontrolled with hyperglycemia A1C 8.9 - continue sliding scale ongoing Off all scheduled insulin - hypoglycemia noted over past 12h - D5,1/2NS ongoing given NPO status and ongoing hypoglycemia today despite boluses.   Anemia/thrombocytopenia due to critical illness: Status post 5 units of packed red blood cells, hemoglobin has remains stable.   Incidental liver mass: Seen on CT scan of the abdomen pelvis 7 x 4 cm anterior liver mass. Will need MRI as an outpatient.   Hyponatremia: Further management per nephrology.    Stage IIc decubitus ulcer Pressure Injury 08/03/23 Buttocks Left Stage 2 -  Partial thickness loss of dermis presenting as a shallow open injury with a red, pink wound bed without slough. Several small open areas, right buttocks area (Active)  08/03/23 1945  Location: Buttocks  Location Orientation: Left  Staging: Stage 2 -  Partial thickness loss of dermis presenting as a shallow open injury with a red, pink wound bed without slough.  Wound Description (Comments): Several small open areas, right buttocks area   Present on Admission: No (present on transfer to [facility])   DVT prophylaxis: Place and maintain sequential compression device Start: 07/30/23 1415   Code Status:   Code Status: Full Code  Family Communication: Attempted to call Mother and two other listed contacts to update them on his status change multiple times with no answer. Will try again this afternoon.  Status is: Inpt  Dispo: The patient is from: Home              Anticipated d/c is to: LTAC              Anticipated d/c date is: 24-48h              Patient currently IS medically stable for discharge  Consultants:  PCCM, neuro  Antimicrobials:  Vancomycin x6 weeks   Subjective: No acute issue/events overnight  Objective: Vitals:   08/20/23 2109 08/21/23 0029 08/21/23 0425 08/21/23 0500  BP:  119/79 132/85   Pulse: (!) 115 (!) 105 96   Resp:  18 18   Temp:  98.2 F (36.8 C) (!) 97.5 F (36.4 C)   TempSrc:  Oral Axillary   SpO2:  100% 99%   Weight:    75.3 kg  Height:       Intake/Output Summary (Last 24 hours) at 08/21/2023 0743 Last data filed at 08/21/2023 0600 Gross per 24 hour  Intake 8356.69 ml  Output --  Net 8356.69 ml  Filed Weights   08/19/23 0605 August 23, 2023 0345 08/21/23 0500  Weight: 74.4 kg 74.8 kg 75.3 kg   Examination:  General exam: Somnolent but arousable Respiratory system: Clear to auscultation. Respiratory effort normal. Cardiovascular system: S1 & S2 heard, RRR. No JVD, murmurs, rubs, gallops or clicks. No pedal edema. Gastrointestinal system: Abdomen is nondistended, soft and nontender. No organomegaly or masses felt. Normal bowel sounds heard. Central nervous system: Awake, unable to answer questions appropriately. Extremities: Not following commands, difficult to assess.  Data Reviewed: I have personally reviewed following labs and imaging studies  CBC: Recent Labs  Lab 08/15/23 0219 08/16/23 1615 08/17/23 1053 08/18/23 1533 08/23/23 0815  WBC 11.3* 10.0 10.5 7.8 20.9*   NEUTROABS 8.4* 7.2 8.8* 5.8  --   HGB 9.9* 8.7* 10.3* 9.1* 10.2*  HCT 32.1* 27.7* 32.5* 29.3* 31.9*  MCV 93.9 93.9 93.4 93.0 92.2  PLT 380 345 350 285 226   Basic Metabolic Panel: Recent Labs  Lab 08/15/23 0219 08/16/23 1615 08/17/23 1053 08/18/23 0214 08/18/23 1245 08/19/23 0237 August 23, 2023 0222 23-Aug-2023 1751  NA 126* 127* 126*  --  125*  --   --  131*  K 4.4 4.7 4.5  --  5.6*  --   --  5.0  CL 89* 89* 90*  --  87*  --   --  94*  CO2 25 26 24   --  23  --   --  23  GLUCOSE 340* 89 161*  --  401*  --   --  114*  BUN 80* 131* 64*  --  98*  --   --  94*  CREATININE 5.37* 7.08* 4.54*  --  6.35*  --   --  6.70*  CALCIUM 9.8 9.7 9.0  --  8.8*  --   --  8.9  MG 2.8* 3.2* 2.5* 2.7*  --  2.5* 2.6*  --   PHOS 4.2 5.2* 4.3  --   --   --   --  4.6   GFR: Estimated Creatinine Clearance: 13.3 mL/min (A) (by C-G formula based on SCr of 6.7 mg/dL (H)).  Liver Function Tests: Recent Labs  Lab 08/15/23 0219 08/16/23 1615 08/17/23 1053 August 23, 2023 1751  AST 18 14* 37  --   ALT 11 11 10   --   ALKPHOS 178* 136* 136*  --   BILITOT 0.8 0.8 0.9  --   PROT 8.0 7.1 8.2*  --   ALBUMIN 2.5* 2.3* 2.6* 2.4*   CBG: Recent Labs  Lab 08-23-23 1547 August 23, 2023 2025 08/21/23 0025 08/21/23 0421 08/21/23 0741  GLUCAP 85 140* 157* 101* 129*   No results found for this or any previous visit (from the past 240 hour(s)).   Radiology Studies: EEG adult  Result Date: Aug 23, 2023 Shane Quest, MD     August 23, 2023  5:46 PM Patient Name: [name] MRN: [id number] Epilepsy Attending: Charlsie [name] Referring Physician/Provider: Marjorie Smolder, NP Date: 08-23-23 Duration: 27.13 mins Patient history: 42 yo M wth right gaze deviation noted, patient able to barely cross midline with much stimulation. EEG to evaluate for seizure Level of alertness: Awake AEDs during EEG study: None Technical aspects: This EEG study was done with scalp electrodes positioned according to the 10-20 International  system of electrode placement. Electrical activity was reviewed with band pass filter of 1-70Hz , sensitivity of 7 uV/mm, display speed of 19mm/sec with a 60Hz  notched filter applied as appropriate. EEG data were recorded continuously and digitally stored.  Video monitoring was available and reviewed as appropriate. Description: The posterior dominant rhythm consists of 6 Hz activity of moderate voltage (25-35 uV) seen predominantly in posterior head regions, symmetric and reactive to eye opening and eye closing. EEG showed continuous generalized and lateralized right hemisphere 3 to 6 Hz theta-delta slowing. Physiologic photic driving was not seen during photic stimulation. Hyperventilation was not performed.   ABNORMALITY - Continuous slow, generalized and lateralized right hemisphere IMPRESSION: This study is is suggestive of cortical dysfunction arising from right hemisphere likely secondary to underlying stroke. Additionally there is moderate diffuse encephalopathy. No seizures or epileptiform discharges were seen throughout the recording. Priyanka Annabelle Harman   CT HEAD WO CONTRAST ( )  Result Date: 08/20/2023 CLINICAL DATA:  Mental status change, unknown cause EXAM: CT HEAD WITHOUT CONTRAST TECHNIQUE: Contiguous axial images were obtained from the base of the skull through the vertex without intravenous contrast. RADIATION DOSE REDUCTION: This exam was performed according to the departmental dose-optimization program which includes automated exposure control, adjustment of the mA and/or kV according to patient size and/or use of iterative reconstruction technique. COMPARISON:  CT Head 07/21/23 FINDINGS: Brain: There is extension of the right MCA territory infarct which now involves a large portion of the right frontal and parietal lobes, as well as the right basal ganglia. There is petechial hemorrhage associated with the area of infarct with possible small volume subarachnoid hemorrhage along the lateral  aspect. There is increased leftward midline shift measuring 9 mm. No evidence of hydrocephalus at the time of exam. Vascular: There is a punctate hyperdensity centrally in the area of infarct (series 2, image 15), which could represent a thrombosed vessel, unchanged prior. Skull: Normal. Negative for fracture or focal lesion. Sinuses/Orbits: No middle ear effusion. Small right mastoid effusion. Mucosal thickening in the left maxillary sinus. Orbits are unremarkable. Other: None. IMPRESSION: 1. Extension of the right MCA territory infarct which now involves a large portion of the right frontal and parietal lobes, as well as the right basal ganglia. There is petechial hemorrhage associated with the area of infarct with possible small volume subarachnoid hemorrhage along the lateral aspect. 2. Increased leftward midline shift measuring 9 mm. No evidence of hydrocephalus at the time of exam. Electronically Signed   By: Lorenza Cambridge M.D.   On: 08/20/2023 12:29    Scheduled Meds:  calcitRIOL  1.5 mcg Per Tube Q M,W,F   Chlorhexidine Gluconate Cloth  6 each Topical Daily   darbepoetin (ARANESP) injection - DIALYSIS  100 mcg Subcutaneous Q Fri-1800   feeding supplement  1 Container Oral TID BM   feeding supplement (PROSource TF20)  60 mL Per Tube Daily   insulin aspart  0-9 Units Subcutaneous Q4H   metoprolol tartrate  50 mg Per Tube BID   midodrine  10 mg Per Tube Q M,W,F-HD   multivitamin  1 tablet Per Tube QHS   nutrition supplement (JUVEN)  1 packet Per Tube BID BM   mouth rinse  15 mL Mouth Rinse 4 times per day   Continuous Infusions:  anticoagulant sodium citrate     dextrose 5 % and 0.45 % NaCl 40 mL/hr at 08/21/23 0600   vancomycin      LOS: 42 days   Time spent:  Azucena Fallen, DO [facility]  If 7PM-7AM, please contact night-coverage [email or web address]  08/21/2023, 7:43 AM

## 2023-08-21 NOTE — Progress Notes (Signed)
Informed charge nurse Dow Adolph, RN that patient's mom wanted to speak with a doctor about her son and any changes that have happened. Made mother aware that I would tell the charge nurse about her wishes and that nurse would page the appropriate doctors. I also informed mom that I couldn't give her a time limit on how long this would take. 10 minutes later, mom leaves the floor. Charge nurse made aware.

## 2023-08-21 NOTE — Progress Notes (Addendum)
[facility] Progress Note  Subjective: seen in room, no c/o's.   Vitals:   08/21/23 0029 08/21/23 0425 08/21/23 0500 08/21/23 1559  BP: 119/79 132/85  120/88  Pulse: (!) 105 96  (!) 110  Resp: 18 18  18   Temp: 98.2 F (36.8 C) (!) 97.5 F (36.4 C)  98 F (36.7 C)  TempSrc: Oral Axillary  Oral  SpO2: 100% 99%    Weight:   75.3 kg   Height:        Exam: Gen: alert, NG tube on  CVS: Regular tachycardia, S1 and S2 Resp: Transmitted breath sounds, no distinct rales or rhonchi, [place] O2 Abd: Soft, flat, nontender, bowel sounds normal Ext: Status post left AKA  LUE edema, no other edema Access: Left femoral TDC in place    Renal-related home meds: - sensipar 120 at bedtime - gabapentin 100mg  - renvavite - renvela 3 ac tid    OP HD: MWF [facility] 4h  400/1.5    77kg   2/3 bath  [facility] TDC    Heparin none - last OP HD 10/7, post wt 74.6kg - misses HD once every other week approx - rocaltrol 1.50 mcg    Assessment/ Plan: 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 Not a candidate for surgery; CT surgery recommended management w/ antibiotics. SP line holiday w/ old TDC removed 10/16 and new TDC placed 10/18 by IR.  Needs IV vanc for 6 wks thru 09/20/23.  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 Repeat CT 11/22 shows extension of R MCA territory now involving large portion of right frontal and parietal lobes as well as right basal ganglia with petechial hemorrhage and possible small volume subarachnoid hemorrhage. Eliquis is now on hold.  ESRD: on HD MWF. SP CRRT 10/22- 10/30, back on iHD now. Had HD yesterday. Is on HD today but was not supposed to get HD today (Sat). Almost finished. Will hold off HD tomorrow (holiday schedule). NO HEPARIN with dialysis.  Volume: [place] 2L O2 dc'd on 11/10, remains on RA. Euvolemic on exam except LUE edema. New dry wt 2-3 kg lower. .  Anemia: Hb 7- 9 range. ESA  dose increased to 100 mcg q. weekly. 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 Monitor on renal diet.  On calcitriol for PTH control. Nutrition: cortrak in place, getting tube feeds, not eating much per RN Hypotension: Weaned off of pressors and off midodrine HD access: SP line holiday with new L  femoral TDC placed on 10/18. 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 Dispo: he successfully dialyzed in a chair 11/18. Will not need to do this again. Ok for dc to SNF.      Shane Moselle MD  [facility] 08/21/2023, 6:36 PM  Recent Labs  Lab 08/17/23 1053 08/18/23 1245 08/18/23 1533 08/20/23 1751 08/21/23 1556 08/21/23 1755  HGB 10.3*  --    < >  --  9.9* 8.8*  ALBUMIN 2.6*  --   --  2.4*  --   --   CALCIUM 9.0 8.8*  --  8.9  --   --   PHOS 4.3  --   --  4.6  --   --   CREATININE 4.54* 6.35*  --  6.70*  --   --   K 4.5 5.6*  --  5.0  --   --    < > = values  in this interval not displayed.   No results for input(s): "IRON", "TIBC", "FERRITIN" in the last 168 hours. Inpatient medications:  calcitRIOL  1.5 mcg Per Tube Q M,W,F   Chlorhexidine Gluconate Cloth  6 each Topical Daily   darbepoetin (ARANESP) injection - DIALYSIS  100 mcg Subcutaneous Q Fri-1800   feeding supplement  1 Container Oral TID BM   feeding supplement (NEPRO CARB STEADY)  1,000 mL Per Tube Q24H   feeding supplement (PROSource TF20)  60 mL Per Tube Daily   insulin aspart  0-9 Units Subcutaneous Q4H   metoprolol tartrate  50 mg Per Tube BID   midodrine  10 mg Per Tube Q M,W,F-HD   multivitamin  1 tablet Per Tube QHS   nutrition supplement (JUVEN)  1 packet Per Tube BID BM   mouth rinse  15 mL Mouth Rinse 4 times per day    anticoagulant sodium citrate     vancomycin     acetaminophen (TYLENOL) oral liquid 160 mg/5 mL, albuterol, alteplase, anticoagulant sodium citrate, feeding supplement (NEPRO CARB STEADY), Gerhardt's butt cream, heparin, heparin, heparin sodium  (porcine), lidocaine (PF), lidocaine-prilocaine, lip balm, loperamide HCl, mouth rinse, pentafluoroprop-tetrafluoroeth

## 2023-08-21 NOTE — Progress Notes (Addendum)
STROKE TEAM PROGRESS NOTE   BRIEF HPI [name] is a 42 y.o. male with history of  ESRD on HD MWF, right diabetic foot ulcer complicated by MRSA bacteremia 7/24 likely 2/2 to HD line infection, insulin dependent diabetes, gastroparesis, hx of left AKA, who presents with AMS on 10/11 and found to have septic shock with hypotension, leukocytosis and encephalopathy. He was admitted in July with MRSA bacteremia. He was treated with vancomycin, refused echo and left AMA.  Blood cultures this admission grew MRSA bacteremia. TEE showed large vegetation measuring 2 cm x 0.8 cm on the posterior mitral valve leaflet extending into the mitral valve annulus with high mobility. Also 2 mobile masses on the anterior leaflet of the tricuspid valve and a 3rd mass on the posterior tricuspid valve. He mentioned left arm weakness for some time. MRI completed and showed right MCA infarct with acute infarcts in left occipital and bilateral cerebellum.  On 11/22, patient had worsening mental status and repeat head CT demonstrated worsening right MCA territory infarct with petechial hemorrhage and subarachnoid hemorrhage along with 9 mm midline shift to the right, stroke team was reconsulted.  Hemorrhagic transformation likely occurred in setting of Eliquis use for DVT.  Eliquis should remain stopped at this time.  Will obtain MRI to further characterize whether hemorrhagic transformation is seen on CT versus edema.   SIGNIFICANT HOSPITAL EVENTS 10/12 patient admitted with altered mental status and DKA 10/13 patient was found to have endocarditis 10/16 felt poor candidate for cardiac surgery 10/17 patient had acute right MCA infarct, likely due to septic emboli 10/20 intubated and placed on CRRT 10/28 patient now extubated, had multiple episodes of hypoglycemia in past few days 11/13 pursuing LTAC placement but mother refusing PEG tube 11/22 worsening mental status noted and stat head CT demonstrates worsening right  MCA territory infarct as well as petechial hemorrhage, subarachnoid hemorrhage and 9 mm midline shift to the left.  EEG demonstrates no seizures.  INTERIM HISTORY/SUBJECTIVE Patient has remained hemodynamically stable overnight.  EEG was negative for seizure activity.  He is doing better this morning, able to speak fluently and moving right upper and lower extremities well with antigravity strength.  However, he is agitated and perseverates on wanting food.  Cooperation with exam was limited.  OBJECTIVE  CBC    Component Value Date/Time   WBC 20.9 (H) 08/20/2023 0815   RBC 3.46 (L) 08/20/2023 0815   HGB 10.2 (L) 08/20/2023 0815   HGB 9.7 (L) 08/22/2014 0447   HCT 31.9 (L) 08/20/2023 0815   HCT 30.0 (L) 08/22/2014 0447   PLT 226 08/20/2023 0815   PLT 158 08/22/2014 0447   MCV 92.2 08/20/2023 0815   MCV 97 08/22/2014 0447   MCH 29.5 08/20/2023 0815   MCHC 32.0 08/20/2023 0815   RDW 17.2 (H) 08/20/2023 0815   RDW 15.3 (H) 08/22/2014 0447   LYMPHSABS 1.0 08/18/2023 1533   LYMPHSABS 1.7 08/22/2014 0447   MONOABS 0.6 08/18/2023 1533   MONOABS 0.8 08/22/2014 0447   EOSABS 0.4 08/18/2023 1533   EOSABS 0.4 08/22/2014 0447   BASOSABS 0.0 08/18/2023 1533   BASOSABS 0.1 08/22/2014 0447    BMET    Component Value Date/Time   NA 131 (L) 08/20/2023 1751   NA 138 08/22/2014 0447   K 5.0 08/20/2023 1751   K 4.0 08/22/2014 0447   CL 94 (L) 08/20/2023 1751   CL 100 08/22/2014 0447   CO2 23 08/20/2023 1751   CO2 30  08/22/2014 0447   GLUCOSE 114 (H) 08/20/2023 1751   GLUCOSE 88 08/22/2014 0447   BUN 94 (H) 08/20/2023 1751   BUN 14 08/22/2014 0447   CREATININE 6.70 (H) 08/20/2023 1751   CREATININE 5.00 (H) 08/22/2014 0447   CALCIUM 8.9 08/20/2023 1751   CALCIUM 8.2 (L) 08/22/2014 0447   GFRNONAA 10 (L) 08/20/2023 1751   GFRNONAA 14 (L) 08/22/2014 0447   GFRNONAA 5 (L) 06/19/2014 0937    IMAGING past 24 hours EEG adult  Result Date: 08/20/2023 Charlsie Quest, MD      08/20/2023  5:46 PM Patient Name: [name] MRN: [id number] Epilepsy Attending: Charlsie Quest Referring Physician/Provider: Marjorie Smolder, NP Date: 08/20/2023 Duration: 27.13 mins Patient history: 42 yo M wth right gaze deviation noted, patient able to barely cross midline with much stimulation. EEG to evaluate for seizure Level of alertness: Awake AEDs during EEG study: None Technical aspects: This EEG study was done with scalp electrodes positioned according to the 10-20 International system of electrode placement. Electrical activity was reviewed with band pass filter of 1-70Hz , sensitivity of 7 uV/mm, display speed of 32mm/sec with a 60Hz  notched filter applied as appropriate. EEG data were recorded continuously and digitally stored.  Video monitoring was available and reviewed as appropriate. Description: The posterior dominant rhythm consists of 6 Hz activity of moderate voltage (25-35 uV) seen predominantly in posterior head regions, symmetric and reactive to eye opening and eye closing. EEG showed continuous generalized and lateralized right hemisphere 3 to 6 Hz theta-delta slowing. Physiologic photic driving was not seen during photic stimulation. Hyperventilation was not performed.   ABNORMALITY - Continuous slow, generalized and lateralized right hemisphere IMPRESSION: This study is is suggestive of cortical dysfunction arising from right hemisphere likely secondary to underlying stroke. Additionally there is moderate diffuse encephalopathy. No seizures or epileptiform discharges were seen throughout the recording. Charlsie Quest    Vitals:   08/20/23 2109 08/21/23 0029 08/21/23 0425 08/21/23 0500  BP:  119/79 132/85   Pulse: (!) 115 (!) 105 96   Resp:  18 18   Temp:  98.2 F (36.8 C) (!) 97.5 F (36.4 C)   TempSrc:  Oral Axillary   SpO2:  100% 99%   Weight:    75.3 kg  Height:         PHYSICAL EXAM Constitutional: Chronically ill-appearing patient with left AKA in no  acute distress Cardiovascular: Normal rate and regular rhythm.  Respiratory: Effort normal, non-labored breathing.    NEURO (cooperation with exam limited):  Patient is awake and alert and responds to his name, appears to know that he is in the hospital but refuses to answer when asked the date.  He is agitated and perseverating on wanting food.  Left facial droop noted, patient able to track examiner around the room.  He is able to move right upper and lower extremities spontaneously with antigravity strength but does not move left upper and lower extremities.   ASSESSMENT/PLAN  Stroke: Large right MCA infarct and small left occipital lobe and bilateral cerebellum infarcts,  now with extension of right MCA territory infarct into right frontal and parietal lobes as well as right basal ganglia with petechial hemorrhage. etiology:  embolic in the setting of endocarditis with hemorrhagic transformation likely caused by Eliquis use CT head No acute abnormality, motion degraded   MRI 10/17 Sizable right MCA branch infarct affecting the upper division. Small acute infarcts in the left occipital lobe and bilateral  cerebellum. Petechial hemorrhage is present without hematoma. No worrisome mass effect. MRA  Poor signal in a long segment of a right M2 branch, which is favored to be artifactual, but could represent a true stenosis or occlusion Repeat CT scan 11/22: Extension of right MCA territory infarct now involving large portion of right frontal and parietal lobes as well as right basal ganglia with petechial hemorrhage and possible small volume subarachnoid hemorrhage Repeat MRI and MRA pending Carotid Doppler unremarkable 2D Echo EF 25-30% and abnormal mitral valve TEE: EF 30-35%. Large mass on posterior mitral leaflet.  2 mobile masses on the anterior leaflet of the tricuspid valve with perforation.  Another mobile mass on the posterior tricuspid leaflet EEG no seizures or epileptiform  discharges LDL 34 HgbA1c 8.9 VTE prophylaxis -SCDs No antithrombotic prior to admission, was on eliquis for DVT. Now AC on hold given new findings on CT. Will decide on eliquis based on MRI.  Therapy recommendations: LTAC Disposition: Pending  Congestive Heart Failure Endocarditis TEE showed EF 30-35%. Large mass on posterior mitral leaflet.  2 mobile masses on the anterior leaflet of the tricuspid valve with perforation.  Another mobile mass on the posterior tricuspid leaflet CT surgery recommends conservative management with antibiotics Patient is not a candidate for CT surgery Management per primary team and cardiology.   On vancomycin  MRSA bacteremia Line holiday completed ID following  Abx: Vancomycin  ESRD on HD MWF HD cath replaced Nephrology following  Hypotensive BP goal is normotension SBP currently 90-110  during hospitalization Now on midodrine  Left upper extremity DVT On 11/4, patient noted to have a DVT in left internal jugular vein, left subclavian vein and left axillary vein Was on Eliquis  Now on hold in setting of new findings on CT Will decide AC based on repeat MRI.  Diabetes type II Uncontrolled Home meds:  Novolin HgbA1c 8.9, goal < 7.0 CBGs SSI Recommend close follow-up with PCP for better DM control  Dysphagia Patient has post-stroke dysphagia Was no diet, now NPO Speech on board Still has cortrak now On tube feeding Advance diet as tolerated  Other acute issues Hyponatremia, sodium 131--125--131 Leukocytosis WBC 20.5--15.3--13.7--20.9  Hospital day # 42  Patient seen by NP and then by MD, MD to edit note as needed.  Cortney E Ernestina Columbia , MSN, AGACNP-BC [facility] See Amion for schedule and pager information 08/21/2023 1:19 PM   ATTENDING NOTE: I reviewed above note and agree with the assessment and plan. Pt was seen and examined.   Mom at the bedside. Pt lying in bed, asking for food and going home. On exam,  he is awake, alert, eyes open, orientated to age, place and people, but not to time. Perseverated on year and month. No aphasia, limited language content, following all simple commands. No gaze palsy, tracking bilaterally, left hemianopia, PERRL. Left facial droop. Tongue midline. RUE 5/5, RLE 3/5. LUE flaccid and LLE AKA and flaccid. Sensation symmetrical subjectively, R FTN intact, gait not tested.   Etiology of stroke likely endocarditis.  Yesterday CT showed enlarged size of stroke and mass effect, but could be due to evolving cerebral edema.  Will check MRI.  Will also check MRA to rule out mycotic aneurysm given he was on Eliquis.  Currently Eliquis on hold, will decide AC based on MRI.  Speech on board, did not pass swallow today, still NPO.  Continue tube feeding.  On vancomycin per ID.  Will follow  For detailed assessment and plan,  please refer to above/below as I have made changes wherever appropriate.   Marvel Plan, MD PhD Stroke Neurology 08/21/2023 1:33 PM    To contact Stroke Continuity provider, please refer to [email or web address]. After hours, contact General Neurology

## 2023-08-21 NOTE — Plan of Care (Signed)
  Problem: Safety: Goal: Ability to remain free from injury will improve Outcome: Progressing   

## 2023-08-22 ENCOUNTER — Inpatient Hospital Stay (HOSPITAL_COMMUNITY): Payer: Medicare HMO

## 2023-08-22 DIAGNOSIS — I63411 Cerebral infarction due to embolism of right middle cerebral artery: Secondary | ICD-10-CM | POA: Diagnosis not present

## 2023-08-22 DIAGNOSIS — I059 Rheumatic mitral valve disease, unspecified: Secondary | ICD-10-CM | POA: Diagnosis not present

## 2023-08-22 LAB — MAGNESIUM: Magnesium: 2.6 mg/dL — ABNORMAL HIGH (ref 1.7–2.4)

## 2023-08-22 LAB — GLUCOSE, CAPILLARY
Glucose-Capillary: 164 mg/dL — ABNORMAL HIGH (ref 70–99)
Glucose-Capillary: 192 mg/dL — ABNORMAL HIGH (ref 70–99)
Glucose-Capillary: 233 mg/dL — ABNORMAL HIGH (ref 70–99)
Glucose-Capillary: 418 mg/dL — ABNORMAL HIGH (ref 70–99)
Glucose-Capillary: 291 mg/dL — ABNORMAL HIGH (ref 70–99)

## 2023-08-22 LAB — GLUCOSE, RANDOM: Glucose, Bld: 406 mg/dL — ABNORMAL HIGH (ref 70–99)

## 2023-08-22 MED ORDER — ACETAMINOPHEN 325 MG PO TABS
650.0000 mg | ORAL_TABLET | Freq: Four times a day (QID) | ORAL | Status: DC | PRN
  Filled 2023-08-22: qty 2

## 2023-08-22 MED ORDER — METOPROLOL TARTRATE 50 MG PO TABS
50.0000 mg | ORAL_TABLET | Freq: Two times a day (BID) | ORAL | Status: DC
  Administered 2023-08-22 – 2023-08-27 (×9): 50 mg via ORAL
  Filled 2023-08-22 (×10): qty 1

## 2023-08-22 MED ORDER — RENA-VITE PO TABS
1.0000 | ORAL_TABLET | Freq: Every day | ORAL | Status: DC
  Administered 2023-08-22 – 2023-08-25 (×4): 1 via ORAL
  Filled 2023-08-22 (×5): qty 1

## 2023-08-22 MED ORDER — LOPERAMIDE HCL 1 MG/7.5ML PO SUSP: 2.0000 mg | ORAL | Status: DC | PRN

## 2023-08-22 MED ORDER — MIDODRINE HCL 5 MG PO TABS
10.0000 mg | ORAL_TABLET | ORAL | Status: DC
  Administered 2023-08-24: 10 mg via ORAL
  Filled 2023-08-22: qty 2

## 2023-08-22 MED ORDER — CALCITRIOL 1 MCG/ML PO SOLN
1.5000 ug | ORAL | Status: DC
  Administered 2023-08-23 – 2023-08-27 (×3): 1.5 ug via ORAL
  Filled 2023-08-22 (×3): qty 1.5

## 2023-08-22 MED ORDER — NEPRO/CARBSTEADY PO LIQD
500.0000 mL | ORAL | Status: DC
  Administered 2023-08-22 – 2023-08-24 (×3): 500 mL
  Filled 2023-08-22: qty 1000
  Filled 2023-08-22 (×3): qty 711

## 2023-08-22 MED ORDER — INSULIN GLARGINE-YFGN 100 UNIT/ML ~~LOC~~ SOLN
15.0000 [IU] | Freq: Every day | SUBCUTANEOUS | Status: DC
Start: 2023-08-22 — End: 2023-08-25
  Administered 2023-08-22 – 2023-08-25 (×4): 15 [IU] via SUBCUTANEOUS
  Filled 2023-08-22 (×4): qty 0.15

## 2023-08-22 MED ORDER — LORAZEPAM 2 MG/ML IJ SOLN
1.0000 mg | Freq: Once | INTRAMUSCULAR | Status: AC
  Administered 2023-08-22: 1 mg via INTRAVENOUS
  Filled 2023-08-22: qty 1

## 2023-08-22 NOTE — Progress Notes (Signed)
Patient sent off floor to MRI, per order. Patient received IVP Ativan 1 mg prior to patient leaving floor.

## 2023-08-22 NOTE — Progress Notes (Signed)
[facility] Progress Note  Subjective: seen in room, no c/o's.   Vitals:   08/22/23 0339 08/22/23 0804 08/22/23 1159 08/22/23 1650  BP: (!) 144/80 (!) 105/94 100/68 (!) 144/85  Pulse: (!) 117 (!) 115 (!) 111 (!) 113  Resp: 13 17 17 18   Temp: 97.8 F (36.6 C) 98 F (36.7 C) 97.9 F (36.6 C) 97.9 F (36.6 C)  TempSrc: Oral Oral Oral Oral  SpO2: 100% 96% 100% 100%  Weight:      Height:        Exam: Gen: alert, NG tube on  CVS: Regular tachycardia, S1 and S2 Resp: Transmitted breath sounds, no distinct rales or rhonchi, [place] O2 Abd: Soft, flat, nontender, bowel sounds normal Ext: Status post left AKA  LUE edema, no other edema Access: Left femoral TDC in place    Renal-related home meds: - sensipar 120 at bedtime - gabapentin 100mg  - renvavite - renvela 3 ac tid    OP HD: MWF [facility] 4h  400/1.5    77kg   2/3 bath  [facility] TDC    Heparin none - last OP HD 10/7, post wt 74.6kg - misses HD once every other week approx - rocaltrol 1.50 mcg    Assessment/ Plan: 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 Not a candidate for surgery; CT surgery recommended management w/ antibiotics. SP line holiday w/ old TDC removed 10/16 and new TDC placed 10/18 by IR.  Needs IV vanc for 6 wks thru 09/20/23.  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 Repeat CT 11/22 shows extension of R MCA territory now involving large portion of right frontal and parietal lobes as well as right basal ganglia with petechial hemorrhage and possible small volume subarachnoid hemorrhage. Eliquis is now on hold and neuro seeing patient  ESRD: on HD MWF. SP CRRT 10/22- 10/30, back on iHD now. Had HD Friday and Saturday which was a mistake. Does not need HD until Tuesday 11/26.  Volume: [place] 2L O2 dc'd on 11/10, remains on RA. Euvolemic on exam except LUE edema. New dry wt 2-3 kg lower. .  Anemia: Hb 7- 9 range. ESA dose  increased to 100 mcg q. weekly. 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 Monitor on renal diet.  On calcitriol for PTH control. Nutrition: cortrak in place, getting tube feeds, not eating much per RN Hypotension: Weaned off of pressors and off midodrine HD access: SP line holiday with new L  femoral TDC placed on 10/18. 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 Dispo: he successfully dialyzed in a chair 11/18. Will not need to do this again unless there is some clinical change. Ok for dc to SNF.      Vinson Moselle MD  [facility] 08/22/2023, 6:48 PM  Recent Labs  Lab 08/17/23 1053 08/18/23 1245 08/18/23 1533 08/20/23 1751 08/21/23 1556 08/21/23 1755  HGB 10.3*  --    < >  --  9.9* 8.8*  ALBUMIN 2.6*  --   --  2.4*  --   --   CALCIUM 9.0 8.8*  --  8.9  --   --   PHOS 4.3  --   --  4.6  --   --   CREATININE 4.54* 6.35*  --  6.70*  --   --   K 4.5 5.6*  --  5.0  --   --    < > =  values in this interval not displayed.   No results for input(s): "IRON", "TIBC", "FERRITIN" in the last 168 hours. Inpatient medications:  [START ON 08/23/2023] calcitRIOL  1.5 mcg Oral Q M,W,F   Chlorhexidine Gluconate Cloth  6 each Topical Daily   darbepoetin (ARANESP) injection - DIALYSIS  100 mcg Subcutaneous Q Fri-1800   feeding supplement  1 Container Oral TID BM   feeding supplement (NEPRO CARB STEADY)  500 mL Per Tube Q24H   feeding supplement (PROSource TF20)  60 mL Per Tube Daily   insulin aspart  0-9 Units Subcutaneous Q4H   insulin glargine-yfgn  15 Units Subcutaneous Daily   metoprolol tartrate  50 mg Oral BID   [START ON 08/23/2023] midodrine  10 mg Oral Q M,W,F-HD   multivitamin  1 tablet Oral QHS   nutrition supplement (JUVEN)  1 packet Per Tube BID BM   mouth rinse  15 mL Mouth Rinse 4 times per day    vancomycin     acetaminophen, albuterol, Gerhardt's butt cream, heparin sodium (porcine), lip balm, loperamide HCl, mouth rinse

## 2023-08-22 NOTE — Plan of Care (Signed)
  Problem: Safety: Goal: Ability to remain free from injury will improve Outcome: Progressing   

## 2023-08-22 NOTE — Plan of Care (Signed)
  Problem: Safety: Goal: Non-violent Restraint(s) Outcome: Progressing   Problem: Pain Management: Goal: General experience of comfort will improve Outcome: Progressing   Problem: Safety: Goal: Ability to remain free from injury will improve Outcome: Progressing

## 2023-08-22 NOTE — Progress Notes (Signed)
PROGRESS NOTE    KEEN BOSO  UXL:[id number] DOB: 05/08/1981 DOA: 07/09/2023 PCP: [name], NP   Brief Narrative:  [name] is an 42 y.o. male past medical history of end-stage renal disease on hemodialysis Monday Wednesday Friday, insulin-dependent diabetes mellitus, gastroparesis comes into close is: 07/08/2022 with altered mental status, she was recently seen in the the hospital on 04/09/2023 for sepsis with diabetic foot cultures grew MRSA during that time patient had catheter removed and replaced ID was consulted who recommended IV vancomycin.  Patient refused echo and left AGAINST MEDICAL ADVICE on 04/14/2023 was found confused by family members on 07/09/2023 brought into the ED and found to be encephalopathic with agitation, her blood glucose was 500 started on IV fluids and IV insulin for DKA CT of the head showed no acute findings.  CT scan of the abdomen and pelvis showed no acute findings but it did show hepatic steatosis, with a liver abnormality 7 x 4 cm septic with a white count of 20 was fluid resuscitated and started empirically on IV Vanco and Zosyn also.  Her blood pressure became soft PCCM was consulted for an ICU admission as she became hypoxic and worsen over night on 07/19/2023.  Hospital course 10/12 - Altered mental status, DKA 10/12 - Blood cultures positive for MRSA 10/13 - TTE LVEF 25-30% w/ severely depressed LVF, global hypokinesis, gd I diastolic dysfxn, mod RV fxn reduction. Possible filamentous structure on MV 10/16 - TEE LVEF 30-35%RV fxn nml. Noted masses on the mitral and tricuspid valves 10/16 - Seen by Cardiothoracic surg. Felt poor candidate. Recommended 6 weeks abx and recommended repeat TEE 1 week 10/17 - Acute right MCA infarct - left occipital and bilateral cerebellum felt c/w septic emboli. Neuro consulted.  10/18 - Tunneled left femoral vasc cath placed after catheter holiday  10/20 - 21 Worsening lethargy/confusion intubated overnight  20/21st 10/22 - TEE LVEF 35%, LV global hypokinesis. Very large mitral vegetation(mobile) - prolapsing into LV.  Increased pressors required. 10/23- per nursing less responsive than yesterday, increased ETT secretions. Clotting off CRRT. Ct head showing expected evolution of the right MCA stroke.  Cardiothoracic surgery re-consulted for worsening MVE. Felt too high risk here. Recommended referral to tertiary care  10/24 agitated when sedation decreased, is purposeful but does not follow commands. Tolerating UF gaols of -150. Still on NE infusion but extubated later that afternoon after passing SBT 10/25 failed swallow eval still on just 3 lpm O2. Encephalopathic. Added seroquel in effort to get him off the precedex. Cardiology told no beds at [facility]. Not clear if accepted or not. Worsening pressor requirements. Got blood for hgb 6. Cefepime resumed for clinical decline and leukocytosis  10/26 Hemoglobin again dropped to 6.3 overnight with increased pressor requirement yesterday afternoon now on vaso and levo. 10/27 Hypoglycemic episode overnight, mental status improved a little over weekend  10/28 Getting another unit of blood more awake.  Oriented x 2.  Still on Precedex.  Having liquid stools. 10/30 Off Precedex, low-dose levo.  Plan to transition to IHD 10/29 Cefepime for aspiration course completed.  10/31 Seen by overnight team for hypoxia and somnolence ABG with mild hypercapnia. Again declined by [facility] because they were at capacity. More awake. Doing better w/ Bed side swallow but still likely aspirating. CRRT stopped.  11/4 Remains on low dose levo 11/5-6; extubated, weaned off pressors and transferred to progressive care unit - [facility] assumed care 11/6 11/7-called [facility], at capacity.  Declined transfer 11/12-femoral line  removed; palliative care consulted for goals of care 11/12-[facility] declined transfer due to capacity 11/13 -15 No changes -pursuing LTACH placement - mother refusing PEG placement -  continue full scope of care  11/21 - Discontinue tube feeds (nepro/banatrol supplement) - follow for PO intake tolerance for continued dispo planning. Hypoglycemic due to poor PO intake - insulin discontinued/titrated down 11/22 - Rapid response early a.m. with worsening mental status - CT shows worsening MCA territory infarct with petechial hemorrhage - anticoagulation held. Neuro consulted. 11/23 - Current Lengthy discussion with patient's mother [name] - plan to move forward with SLP eval and if unable to tolerate appropriate calorie/fluid intake will plan for PEG later this week(likely given NPO status per SLP at this time).   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)   Hemiparesis affecting left side as late effect of stroke ([name])   Dysphagia due to recent stroke   Physical deconditioning   Cavitary pneumonia   Subclavian vein occlusion, bilateral ([name])   Acute blood loss anemia   Fatty liver   Liver mass   Insulin dependent type 1 diabetes mellitus ([name])   Diabetic gastroparesis ([name])   Septic shock ([name])   Anemia of chronic disease   Iron deficiency anemia, unspecified   Secondary hyperparathyroidism of renal origin ([name])   Thrombocytopenia ([name])   Acute metabolic encephalopathy   MRSA bacteremia   Right elbow pain   ESRD on hemodialysis ([name])   Acute systolic CHF (congestive heart failure) ([name])   DCM (dilated cardiomyopathy) ([name])   Coronary artery calcification seen on CAT scan   Infective endocarditis of tricuspid valve   Protein-calorie malnutrition, severe   Hx of AKA (above knee amputation), left ([name])   Cerebrovascular accident (CVA) due to embolism of cerebral artery ([name])   Acute right MCA CVA - expanding with new midline shift 08/20/23 Metabolic encephalopathy with delirium and  agitation improving - Stat head CT for acute mental status changes shows expanding MCA territory infarct with petechial hemorrhage and midline shift (9mm) hold eliquis until cleared by neuro - Neurology reconsulted - appreciate insight/recs - continue to hold anticoagulation - Previous MRI of the brain on 07/15/2023 favored septic emboli. - Off all sedation/narcotics per discussion with family as of 08/19/23  Dysphagia, ongoing Mod-severe protein caloric malnutrition -Patient continues to have poor p.o. intake per staff, family continues to be somewhat hesitant for PEG tube placement SLP eval recommending NPO at this time - plan for PEG tube placement later this week given likelihood of advancing diet to appropriate caloric/fluid needs is low -Off narcotics, sedation, CNS depressants 08/19/23- these are likely not affecting his p.o. intake at this point despite prior concerns from family  Goals of care: -Overall poor prognosis - in the setting of worsening mental status and large MCA territory infarct with enlarged infarct 11/22 with petechial hemorrhage. -Currently has disposition is limited by poor p.o. intake and malnutrition, plan for PEG placement later this week if caloric/volume status cannot be tolerated PO - Ultimate plan to discharge to LTAC previously hindered by nutritional status -[facility] declined patient transfer multiple times  Septic shock due to infective mitral and tricuspid endocarditis in the setting of MRSA bacteremia, resolved: Off pressors, on midodrine for dialysis only Has been denied for surgical intervention by [facility] surgery, [facility],  and [facility]. Will need 6 weeks of vancomycin starting from 08/09/2023 (stop date 09/19/2023) given with dialysis 3 times weekly.   Evolving cavitary pneumonia with mucous plugging: On IV vancomycin per ID recommendations as above   Acute on chronic combined systolic and diastolic heart  failure/possible cardiogenic shock: 2D echo showed an EF of 30% and vegetation tricuspid and mitral valve with severe regurgitation. Continues on metoprolol Likely to require surgical intervention of valves per CT surgery - previously recommended transfer to tertiary center due to high risk of patient procedure. Patient unfortunately continues to be declined by [facility] denied for multiple prohibitive surgical risks Latest [facility] denial 08/10/2023 - call back once PEG placed - unclear if this will change overall trajectory given above  Ongoing sinus tachycardia: Continue metoprolol, Off narcotics Currently on oral midodrine.   Lower extremity DVT: Positive on 08/02/2023, DC eliquis given above petechial bleed on CT 08/20/23    End-stage renal disease on hemodialysis Monday Wednesday Friday: Nephrology following Status post line holiday without catheter removal on 07/14/2023. IR placed tunneled catheter on 07/16/2023.  Hyperglycemia/DKA/diabetes mellitus type 2, uncontrolled with hyperglycemia A1C 8.9 - continue sliding scale ongoing Off all scheduled insulin - hypoglycemia noted over past 12h - D5,1/2NS ongoing given NPO status and ongoing hypoglycemia today despite boluses.   Anemia/thrombocytopenia due to critical illness: Status post 5 units of packed red blood cells, hemoglobin has remains stable.   Incidental liver mass: Seen on CT scan of the abdomen pelvis 7 x 4 cm anterior liver mass. Will need MRI as an outpatient.   Hyponatremia: Further management per nephrology.    Stage IIc decubitus ulcer Pressure Injury 08/03/23 Buttocks Left Stage 2 -  Partial thickness loss of dermis presenting as a shallow open injury with a red, pink wound bed without slough. Several small open areas, right buttocks area (Active)  08/03/23 1945  Location: Buttocks  Location Orientation: Left  Staging: Stage 2 -  Partial thickness loss of dermis presenting as a shallow open injury with a red, pink  wound bed without slough.  Wound Description (Comments): Several small open areas, right buttocks area  Present on Admission: No (present on transfer to [place])   DVT prophylaxis: Place and maintain sequential compression device Start: 07/30/23 1415   Code Status:   Code Status: Full Code  Family Communication: Attempted to call Mother and two other listed contacts to update them on his status change multiple times with no answer. Will try again this afternoon.  Status is: Inpt  Dispo: The patient is from: Home              Anticipated d/c is to: LTAC              Anticipated d/c date is: 24-48h              Patient currently IS medically stable for discharge  Consultants:  PCCM, neuro  Antimicrobials:  Vancomycin x6 weeks   Subjective: No acute issue/events overnight  Objective: Vitals:   08/21/23 2221 08/21/23 2235 08/22/23 0339 08/22/23 0804  BP: 115/71 115/71 (!) 144/80 (!) 105/94  Pulse: (!) 120  (!) 117 (!) 115  Resp: 10  13 17   Temp: 98.2 F (36.8 C) 98.2 F (36.8 C) 97.8 F (36.6 C) 98 F (36.7 C)  TempSrc: Oral Oral Oral Oral  SpO2: 100%  100% 96%  Weight:      Height:       Intake/Output Summary (Last 24 hours) at  08/22/2023 0951 Last data filed at 08/21/2023 2235 Gross per 24 hour  Intake --  Output 2500 ml  Net -2500 ml   Filed Weights   08/19/23 0605 09-09-2023 0345 08/21/23 0500  Weight: 74.4 kg 74.8 kg 75.3 kg   Examination:  General exam: Somnolent but arousable Respiratory system: Clear to auscultation. Respiratory effort normal. Cardiovascular system: S1 & S2 heard, RRR. No JVD, murmurs, rubs, gallops or clicks. No pedal edema. Gastrointestinal system: Abdomen is nondistended, soft and nontender. No organomegaly or masses felt. Normal bowel sounds heard. Central nervous system: Awake, unable to answer questions appropriately. Extremities: Not following commands, difficult to assess.  Data Reviewed: I have personally reviewed following labs  and imaging studies  CBC: Recent Labs  Lab 08/16/23 1615 08/17/23 1053 08/18/23 1533 September 09, 2023 0815 08/21/23 1556 08/21/23 1755  WBC 10.0 10.5 7.8 20.9* 21.8* 21.1*  NEUTROABS 7.2 8.8* 5.8  --   --   --   HGB 8.7* 10.3* 9.1* 10.2* 9.9* 8.8*  HCT 27.7* 32.5* 29.3* 31.9* 31.3* 27.9*  MCV 93.9 93.4 93.0 92.2 92.1 92.7  PLT 345 350 285 226 159 186   Basic Metabolic Panel: Recent Labs  Lab 08/16/23 1615 08/17/23 1053 08/18/23 0214 08/18/23 1245 08/19/23 0237 2023-09-09 0222 09/09/2023 1751 08/21/23 1556 08/22/23 0426  NA 127* 126*  --  125*  --   --  131*  --   --   K 4.7 4.5  --  5.6*  --   --  5.0  --   --   CL 89* 90*  --  87*  --   --  94*  --   --   CO2 26 24  --  23  --   --  23  --   --   GLUCOSE 89 161*  --  401*  --   --  114*  --   --   BUN 131* 64*  --  98*  --   --  94*  --   --   CREATININE 7.08* 4.54*  --  6.35*  --   --  6.70*  --   --   CALCIUM 9.7 9.0  --  8.8*  --   --  8.9  --   --   MG 3.2* 2.5* 2.7*  --  2.5* 2.6*  --  2.8* 2.6*  PHOS 5.2* 4.3  --   --   --   --  4.6  --   --    GFR: Estimated Creatinine Clearance: 13.3 mL/min (A) (by C-G formula based on SCr of 6.7 mg/dL (H)).  Liver Function Tests: Recent Labs  Lab 08/16/23 1615 08/17/23 1053 09-Sep-2023 1751  AST 14* 37  --   ALT 11 10  --   ALKPHOS 136* 136*  --   BILITOT 0.8 0.9  --   PROT 7.1 8.2*  --   ALBUMIN 2.3* 2.6* 2.4*   CBG: Recent Labs  Lab 08/21/23 0741 08/21/23 0925 08/21/23 1614 08/22/23 0341 08/22/23 0805  GLUCAP 129* 136* 149* 164* 192*   No results found for this or any previous visit (from the past 240 hour(s)).   Radiology Studies: EEG adult  Result Date: September 09, 2023 Charlsie Quest, MD     09-09-2023  5:46 PM Patient Name: THIELEN SHELHAMER MRN: [id number] Epilepsy Attending: Charlsie Quest Referring Physician/Provider: Marjorie Smolder, NP Date: 2023/09/09 Duration: 27.13 mins Patient history: 42 yo M wth right gaze deviation noted, patient able  to barely  cross midline with much stimulation. EEG to evaluate for seizure Level of alertness: Awake AEDs during EEG study: None Technical aspects: This EEG study was done with scalp electrodes positioned according to the 10-20 International system of electrode placement. Electrical activity was reviewed with band pass filter of 1-70Hz , sensitivity of 7 uV/mm, display speed of 60mm/sec with a 60Hz  notched filter applied as appropriate. EEG data were recorded continuously and digitally stored.  Video monitoring was available and reviewed as appropriate. Description: The posterior dominant rhythm consists of 6 Hz activity of moderate voltage (25-35 uV) seen predominantly in posterior head regions, symmetric and reactive to eye opening and eye closing. EEG showed continuous generalized and lateralized right hemisphere 3 to 6 Hz theta-delta slowing. Physiologic photic driving was not seen during photic stimulation. Hyperventilation was not performed.   ABNORMALITY - Continuous slow, generalized and lateralized right hemisphere IMPRESSION: This study is is suggestive of cortical dysfunction arising from right hemisphere likely secondary to underlying stroke. Additionally there is moderate diffuse encephalopathy. No seizures or epileptiform discharges were seen throughout the recording. Priyanka Annabelle Harman   CT HEAD WO CONTRAST ( )  Result Date: 08/20/2023 CLINICAL DATA:  Mental status change, unknown cause EXAM: CT HEAD WITHOUT CONTRAST TECHNIQUE: Contiguous axial images were obtained from the base of the skull through the vertex without intravenous contrast. RADIATION DOSE REDUCTION: This exam was performed according to the departmental dose-optimization program which includes automated exposure control, adjustment of the mA and/or kV according to patient size and/or use of iterative reconstruction technique. COMPARISON:  CT Head 07/21/23 FINDINGS: Brain: There is extension of the right MCA territory infarct which now  involves a large portion of the right frontal and parietal lobes, as well as the right basal ganglia. There is petechial hemorrhage associated with the area of infarct with possible small volume subarachnoid hemorrhage along the lateral aspect. There is increased leftward midline shift measuring 9 mm. No evidence of hydrocephalus at the time of exam. Vascular: There is a punctate hyperdensity centrally in the area of infarct (series 2, image 15), which could represent a thrombosed vessel, unchanged prior. Skull: Normal. Negative for fracture or focal lesion. Sinuses/Orbits: No middle ear effusion. Small right mastoid effusion. Mucosal thickening in the left maxillary sinus. Orbits are unremarkable. Other: None. IMPRESSION: 1. Extension of the right MCA territory infarct which now involves a large portion of the right frontal and parietal lobes, as well as the right basal ganglia. There is petechial hemorrhage associated with the area of infarct with possible small volume subarachnoid hemorrhage along the lateral aspect. 2. Increased leftward midline shift measuring 9 mm. No evidence of hydrocephalus at the time of exam. Electronically Signed   By: Lorenza Cambridge M.D.   On: 08/20/2023 12:29    Scheduled Meds:  calcitRIOL  1.5 mcg Per Tube Q M,W,F   Chlorhexidine Gluconate Cloth  6 each Topical Daily   darbepoetin (ARANESP) injection - DIALYSIS  100 mcg Subcutaneous Q Fri-1800   feeding supplement  1 Container Oral TID BM   feeding supplement (NEPRO CARB STEADY)  1,000 mL Per Tube Q24H   feeding supplement (PROSource TF20)  60 mL Per Tube Daily   insulin aspart  0-9 Units Subcutaneous Q4H   metoprolol tartrate  50 mg Per Tube BID   midodrine  10 mg Per Tube Q M,W,F-HD   multivitamin  1 tablet Per Tube QHS   nutrition supplement (JUVEN)  1 packet Per Tube BID BM   mouth rinse  15 mL Mouth Rinse 4 times per day   Continuous Infusions:  vancomycin      LOS: 43 days   Time spent:  Azucena Fallen, DO [facility]  If 7PM-7AM, please contact night-coverage [email or web address]  08/22/2023, 9:51 AM

## 2023-08-22 NOTE — Progress Notes (Addendum)
STROKE TEAM PROGRESS NOTE   BRIEF HPI [name] is a 42 y.o. male with history of  ESRD on HD MWF, right diabetic foot ulcer complicated by MRSA bacteremia 7/24 likely 2/2 to HD line infection, insulin dependent diabetes, gastroparesis, hx of left AKA, who presents with AMS on 10/11 and found to have septic shock with hypotension, leukocytosis and encephalopathy. He was admitted in July with MRSA bacteremia. He was treated with vancomycin, refused echo and left AMA.  Blood cultures this admission grew MRSA bacteremia. TEE showed large vegetation measuring 2 cm x 0.8 cm on the posterior mitral valve leaflet extending into the mitral valve annulus with high mobility. Also 2 mobile masses on the anterior leaflet of the tricuspid valve and a 3rd mass on the posterior tricuspid valve. He mentioned left arm weakness for some time. MRI completed and showed right MCA infarct with acute infarcts in left occipital and bilateral cerebellum.  On 11/22, patient had worsening mental status and repeat head CT demonstrated worsening right MCA territory infarct with petechial hemorrhage and subarachnoid hemorrhage along with 9 mm midline shift to the right, stroke team was reconsulted.  Hemorrhagic transformation likely occurred in setting of Eliquis use for DVT.  Eliquis should remain stopped at this time.  Will obtain MRI to further characterize whether hemorrhagic transformation is seen on CT versus edema.   SIGNIFICANT HOSPITAL EVENTS 10/12 patient admitted with altered mental status and DKA 10/13 patient was found to have endocarditis 10/16 felt poor candidate for cardiac surgery 10/17 patient had acute right MCA infarct, likely due to septic emboli 10/20 intubated and placed on CRRT 10/28 patient now extubated, had multiple episodes of hypoglycemia in past few days 11/13 pursuing LTAC placement but mother refusing PEG tube 11/22 worsening mental status noted and stat head CT demonstrates worsening right  MCA territory infarct as well as petechial hemorrhage, subarachnoid hemorrhage and 9 mm midline shift to the left.  EEG demonstrates no seizures.  INTERIM HISTORY/SUBJECTIVE Patient has remained hemodynamically stable overnight.  He has been intermittently agitated but appears willing to undergo MRI today.  He passed a swallow study and is on a pured diet with nectar thick liquids.  OBJECTIVE  CBC    Component Value Date/Time   WBC 21.1 (H) 08/21/2023 1755   RBC 3.01 (L) 08/21/2023 1755   HGB 8.8 (L) 08/21/2023 1755   HGB 9.7 (L) 08/22/2014 0447   HCT 27.9 (L) 08/21/2023 1755   HCT 30.0 (L) 08/22/2014 0447   PLT 186 08/21/2023 1755   PLT 158 08/22/2014 0447   MCV 92.7 08/21/2023 1755   MCV 97 08/22/2014 0447   MCH 29.2 08/21/2023 1755   MCHC 31.5 08/21/2023 1755   RDW 16.5 (H) 08/21/2023 1755   RDW 15.3 (H) 08/22/2014 0447   LYMPHSABS 1.0 08/18/2023 1533   LYMPHSABS 1.7 08/22/2014 0447   MONOABS 0.6 08/18/2023 1533   MONOABS 0.8 08/22/2014 0447   EOSABS 0.4 08/18/2023 1533   EOSABS 0.4 08/22/2014 0447   BASOSABS 0.0 08/18/2023 1533   BASOSABS 0.1 08/22/2014 0447    BMET    Component Value Date/Time   NA 131 (L) 08/20/2023 1751   NA 138 08/22/2014 0447   K 5.0 08/20/2023 1751   K 4.0 08/22/2014 0447   CL 94 (L) 08/20/2023 1751   CL 100 08/22/2014 0447   CO2 23 08/20/2023 1751   CO2 30 08/22/2014 0447   GLUCOSE 114 (H) 08/20/2023 1751   GLUCOSE 88 08/22/2014 0447  BUN 94 (H) 08/20/2023 1751   BUN 14 08/22/2014 0447   CREATININE 6.70 (H) 08/20/2023 1751   CREATININE 5.00 (H) 08/22/2014 0447   CALCIUM 8.9 08/20/2023 1751   CALCIUM 8.2 (L) 08/22/2014 0447   GFRNONAA 10 (L) 08/20/2023 1751   GFRNONAA 14 (L) 08/22/2014 0447   GFRNONAA 5 (L) 06/19/2014 1191    IMAGING past 24 hours No results found.  Vitals:   08/21/23 2235 08/22/23 0339 08/22/23 0804 08/22/23 1159  BP: 115/71 (!) 144/80 (!) 105/94 100/68  Pulse:  (!) 117 (!) 115 (!) 111  Resp:  13 17 17    Temp: 98.2 F (36.8 C) 97.8 F (36.6 C) 98 F (36.7 C)   TempSrc: Oral Oral Oral   SpO2:  100% 96% 100%  Weight:      Height:         PHYSICAL EXAM Constitutional: Chronically ill-appearing patient with left AKA in no acute distress Cardiovascular: Normal rate and regular rhythm.  Respiratory: Effort normal, non-labored breathing.    NEURO (cooperation with exam limited):  Patient is alert and oriented to person and place but gets the month wrong by 1.  He is intermittently agitated but appears calm when talking on the phone family members.  Pupils equal round and reactive to light, extraocular movements intact, moves right upper and lower extremities spontaneously with antigravity strength, does not move left upper or lower extremities.   ASSESSMENT/PLAN  Stroke: Large right MCA infarct and small left occipital lobe and bilateral cerebellum infarcts,  now with extension of right MCA territory infarct into right frontal and parietal lobes as well as right basal ganglia with petechial hemorrhage. etiology:  embolic in the setting of endocarditis with hemorrhagic transformation likely caused by Eliquis use CT head No acute abnormality, motion degraded   MRI 10/17 Sizable right MCA branch infarct affecting the upper division. Small acute infarcts in the left occipital lobe and bilateral cerebellum. Petechial hemorrhage is present without hematoma. No worrisome mass effect. MRA  Poor signal in a long segment of a right M2 branch, which is favored to be artifactual, but could represent a true stenosis or occlusion Repeat CT scan 11/22: Extension of right MCA territory infarct now involving large portion of right frontal and parietal lobes as well as right basal ganglia with petechial hemorrhage and possible small volume subarachnoid hemorrhage Repeat MRI and MRA pending Carotid Doppler unremarkable 2D Echo EF 25-30% and abnormal mitral valve TEE: EF 30-35%. Large mass on posterior mitral  leaflet.  2 mobile masses on the anterior leaflet of the tricuspid valve with perforation.  Another mobile mass on the posterior tricuspid leaflet EEG no seizures or epileptiform discharges LDL 34 HgbA1c 8.9 VTE prophylaxis -SCDs No antithrombotic prior to admission, was on eliquis for DVT. Now AC on hold given new findings on CT. Will decide on eliquis based on MRI.  Therapy recommendations: LTAC Disposition: Pending  Congestive Heart Failure Endocarditis TEE showed EF 30-35%. Large mass on posterior mitral leaflet.  2 mobile masses on the anterior leaflet of the tricuspid valve with perforation.  Another mobile mass on the posterior tricuspid leaflet CT surgery recommends conservative management with antibiotics Patient is not a candidate for CT surgery Management per primary team and cardiology.   On vancomycin  MRSA bacteremia Line holiday completed ID following  Abx: Vancomycin  ESRD on HD MWF HD cath replaced Nephrology following  Hypotensive BP goal is normotension SBP currently 90-110  during hospitalization Now on midodrine  Left upper extremity  DVT On 11/4, patient noted to have a DVT in left internal jugular vein, left subclavian vein and left axillary vein Was on Eliquis  Now on hold in setting of new findings on CT Will decide AC based on repeat MRI.  Diabetes type II Uncontrolled Home meds:  Novolin HgbA1c 8.9, goal < 7.0 CBGs SSI Recommend close follow-up with PCP for better DM control  Dysphagia Patient has post-stroke dysphagia Now on dys1 and nectar thick liquis Encourage po intake Change continuous TF to nocturnal TF Remove cortrak as able  Other acute issues Hyponatremia, sodium 131--125--131 Leukocytosis WBC 20.5--15.3--13.7--20.9--21.1  Hospital day # 72  Patient seen by NP and then by MD, MD to edit note as needed.  Cortney E Ernestina Columbia , MSN, AGACNP-BC [facility] See Amion for schedule and pager information 08/22/2023  12:04 PM  ATTENDING NOTE: I reviewed above note and agree with the assessment and plan. Pt was seen and examined.   No acute event overnight. Pt mental status improved. Now passed swallow on dys1 and nectar thick liquid. Encourage po intake. Change TF to nocturnal TF to facilitate po intake. Pending MRI and MRA brain. Will decide on [facility] based on MRI. If not able to perform, will repeat CT.   For detailed assessment and plan, please refer to above/below as I have made changes wherever appropriate.   Marvel Plan, MD PhD Stroke Neurology 08/22/2023 12:37 PM    To contact Stroke Continuity provider, please refer to [email or web address]. After hours, contact General Neurology

## 2023-08-22 NOTE — Progress Notes (Signed)
Speech Language Pathology Treatment: Dysphagia  Patient Details Name: [name] MRN: [id number] DOB: 1981-02-24 Today's Date: 08/22/2023 Time: 2536-6440 SLP Time Calculation (min) (ACUTE ONLY): 22 min  Assessment / Plan / Recommendation Clinical Impression  Patient seen for po readiness. Patient fluctuating between being very polite and agreeable to treatment to periods of agitation and poor cooperation. He was able to consume 4 ounces of nectar thick liquid via cup and straw sips with moderate verbal and tactile cueing for small sips. One x throat clear noted during entire 4 ounces. Pureed solids and regular texture solids also consumed without s/s of aspiration but one bite of regular texture solid consumption resulted in increased oral transit time and patient un agreeable to additional trials. Patient judged appropriate to resume a po diet, recommending dysphagia 1, nectar thick liquid to start with hopeful ability to upgrade back to regular solids as he is cooperative with diagnostic treatment sessions.     HPI HPI: [name] is a 42 yo male presenting 10/11 with AMS. His dialysis catheter was partially removed and he was in DKA. Admitted for septic shock from MRSA bacteremia; infective endocarditis. LUE weakness was noted 10/17 and MRI confirmed sizable infarct in the R MCA branch as well as small acute infarcts in the L occipital lobe and bilateral cerebellum. [name] initially evaluated by SLP 10/18 and recommended to have Dys 3 diet and thin liquids due primarily oral symptoms. [name] had worsening hypoxia and AMS after vomiting on 10/21 requiring intubation 10/21-10/24. 10/22-10/30 CRRT. 11/08 MBS, dysphagia 1/nectar. 11/22 neuro changes with new head CT showing "1. Extension of the right MCA territory infarct which now involves a large portion of the right frontal and parietal lobes, as well as the right basal ganglia. There is petechial hemorrhage associated  with the area of infarct with possible small  volume subarachnoid  hemorrhage along the lateral aspect.  2. Increased leftward midline shift measuring 9 mm. No evidence of  hydrocephalus at the time of exam."  PMH includes: ESRD, DM, gastroparesis, L AKA, HTN, recent admission in July for sepsis secondary to R diabetic foot ulcer (left AMA)      SLP Plan  Continue with current plan of care      Recommendations for follow up therapy are one component of a multi-disciplinary discharge planning process, led by the attending physician.  Recommendations may be updated based on patient status, additional functional criteria and insurance authorization.    Recommendations  Diet recommendations: Dysphagia 1 (puree);Nectar-thick liquid Liquids provided via: Cup;Straw Medication Administration: Crushed with puree Supervision: Staff to assist with self feeding;Full supervision/cueing for compensatory strategies Compensations: Minimize environmental distractions;Slow rate;Small sips/bites;Lingual sweep for clearance of pocketing;Monitor for anterior loss Postural Changes and/or Swallow Maneuvers: Seated upright 90 degrees                  Oral care BID   Frequent or constant Supervision/Assistance Dysphagia, oropharyngeal phase (R13.12)     Continue with current plan of care    [facility] MA, CCC-SLP  Niyam Bisping Meryl  08/22/2023, 10:35 AM

## 2023-08-23 ENCOUNTER — Inpatient Hospital Stay (HOSPITAL_COMMUNITY): Payer: Medicare HMO

## 2023-08-23 DIAGNOSIS — G060 Intracranial abscess and granuloma: Secondary | ICD-10-CM | POA: Diagnosis not present

## 2023-08-23 DIAGNOSIS — I059 Rheumatic mitral valve disease, unspecified: Secondary | ICD-10-CM | POA: Diagnosis not present

## 2023-08-23 LAB — BASIC METABOLIC PANEL
Anion gap: 18 — ABNORMAL HIGH (ref 5–15)
BUN: 98 mg/dL — ABNORMAL HIGH (ref 6–20)
CO2: 22 mmol/L (ref 22–32)
Calcium: 9.3 mg/dL (ref 8.9–10.3)
Chloride: 91 mmol/L — ABNORMAL LOW (ref 98–111)
Creatinine, Ser: 6.6 mg/dL — ABNORMAL HIGH (ref 0.61–1.24)
GFR, Estimated: 10 mL/min — ABNORMAL LOW (ref >60)
Glucose, Bld: 170 mg/dL — ABNORMAL HIGH (ref 70–99)
Potassium: 4.7 mmol/L (ref 3.5–5.1)
Sodium: 131 mmol/L — ABNORMAL LOW (ref 135–145)

## 2023-08-23 LAB — CBC
HCT: 31.8 % — ABNORMAL LOW (ref 39.0–52.0)
Hemoglobin: 9.7 g/dL — ABNORMAL LOW (ref 13.0–17.0)
MCH: 28.3 pg (ref 26.0–34.0)
MCHC: 30.5 g/dL (ref 30.0–36.0)
MCV: 92.7 fL (ref 80.0–100.0)
Platelets: 206 10*3/uL (ref 150–400)
RBC: 3.43 MIL/uL — ABNORMAL LOW (ref 4.22–5.81)
RDW: 16.1 % — ABNORMAL HIGH (ref 11.5–15.5)
WBC: 9.9 10*3/uL (ref 4.0–10.5)
nRBC: 0 % (ref 0.0–0.2)

## 2023-08-23 LAB — GLUCOSE, CAPILLARY
Glucose-Capillary: 107 mg/dL — ABNORMAL HIGH (ref 70–99)
Glucose-Capillary: 117 mg/dL — ABNORMAL HIGH (ref 70–99)
Glucose-Capillary: 146 mg/dL — ABNORMAL HIGH (ref 70–99)
Glucose-Capillary: 164 mg/dL — ABNORMAL HIGH (ref 70–99)
Glucose-Capillary: 165 mg/dL — ABNORMAL HIGH (ref 70–99)
Glucose-Capillary: 171 mg/dL — ABNORMAL HIGH (ref 70–99)
Glucose-Capillary: 219 mg/dL — ABNORMAL HIGH (ref 70–99)

## 2023-08-23 MED ORDER — LORAZEPAM 2 MG/ML IJ SOLN
2.0000 mg | Freq: Once | INTRAMUSCULAR | Status: AC
  Administered 2023-08-23: 2 mg via INTRAVENOUS
  Filled 2023-08-23: qty 1

## 2023-08-23 MED ORDER — VANCOMYCIN HCL 500 MG/100ML IV SOLN
500.0000 mg | Freq: Once | INTRAVENOUS | Status: AC
  Administered 2023-08-24: 500 mg via INTRAVENOUS
  Filled 2023-08-23 (×2): qty 100

## 2023-08-23 MED ORDER — VANCOMYCIN HCL 500 MG/100ML IV SOLN: 500.0000 mg | INTRAVENOUS | Status: DC

## 2023-08-23 MED ORDER — VANCOMYCIN HCL 500 MG/100ML IV SOLN
500.0000 mg | INTRAVENOUS | Status: DC
  Filled 2023-08-23: qty 100

## 2023-08-23 NOTE — Consult Note (Signed)
Reason for Consult: Possible brain abscess Referring Physician: Neurology  [name] is an 42 y.o. male.  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  Patient on IV vancomycin.  History of right brain stroke but with some declining neurologic function.  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  Past Medical History:  Diagnosis Date   Anemia    Cellulitis and abscess of toe of left foot    Diabetes mellitus without complication ([name])    Diabetic gastroparesis ([name])    Diabetic ulcer of left midfoot associated with diabetes mellitus due to underlying condition, with necrosis of bone [facility])    Dialysis patient ([name])    ESRD (end stage renal disease) ([name])    Hypertension    Sepsis ([name])     Past Surgical History:  Procedure Laterality Date   AMPUTATION Right 02/02/2018   Procedure: RIGHT FIFTH TOE AND METATARSAL AMPUTATION. Filetted toe flap metatarsal resection. Debridement Plantar Foot wound;  Surgeon: Park Liter, DPM;  Location: [facility] OR;  Service: Podiatry;  Laterality: Right;   AMPUTATION Left 08/20/2018   Procedure: FIFTH METATARSAL BONE BIOPSY;  Surgeon: Park Liter, DPM;  [facility]: [facility] OR;  Service: Podiatry;  Laterality: Left;   AMPUTATION Left 10/28/2018   Procedure: LEFT GREAT TOE AMPUTATION;  Surgeon: Park Liter, DPM;  Location: [facility] OR;  Service: Podiatry;  Laterality: Left;   AMPUTATION Left 09/16/2020   Procedure: AMPUTATION BELOW KNEE;  Surgeon: Larina Earthly, MD;  Location: [facility] OR;  Service: Vascular;  Laterality: Left;   AMPUTATION Left 10/15/2020   Procedure: AMPUTATION ABOVE KNEE;  Surgeon: Sherren Kerns, MD;  Location: [facility] OR;  Service: Vascular;  Laterality: Left;   APPLICATION OF WOUND VAC  02/02/2018   Procedure: APPLICATION OF WOUND VAC  Right Foot;  Surgeon: Park Liter, DPM;  Location: [facility] OR;  Service: Podiatry;;   APPLICATION OF WOUND  VAC Left 10/28/2018   Procedure: APPLICATION OF WOUND VAC LEFT TOE;  Surgeon: Park Liter, DPM;  [facility]: [facility] OR;  Service: Podiatry;  Laterality: Left;   APPLICATION OF WOUND VAC Left 11/01/2018   Procedure: APPLICATION OF WOUND VAC;  Surgeon: Park Liter, DPM;  [facility]: [facility];  Service: Podiatry;  Laterality: Left;   AV FISTULA PLACEMENT     left arm.   AV FISTULA PLACEMENT Right 12/22/2016   Procedure: INSERTION OF ARTERIOVENOUS (AV) GORE-TEX GRAFT ARM;  Surgeon: Sherren Kerns, MD;  Location: [facility] OR;  Service: Vascular;  Laterality: Right;   AV FISTULA PLACEMENT Left 05/26/2018   Procedure: INSERTION OF  ARTERIOVENOUS (AV) GORE-TEX GRAFT LEFT ARM;  Surgeon: Nada Libman, MD;  Location: [facility];  Service: Vascular;  Laterality: Left;   EYE SURGERY     I & D EXTREMITY Right 10/31/2017   Procedure: IRRIGATION AND DEBRIDEMENT RIGHT FOOT;  Surgeon: Park Liter, DPM;  [facility]: [facility];  Service: Podiatry;  Laterality: Right;   I & D EXTREMITY Left 08/20/2018   Procedure: IRRIGATION AND DEBRIDEMENT EXTREMITY WITH SECONDARY WOUND CLOSUREAND APPLICATION OF WOUND VAC LEFT FOOT;  Surgeon: Park Liter, DPM;  [facility]: [facility];  Service: Podiatry;  Laterality: Left;   I & D EXTREMITY Left 10/20/2018   Procedure: IRRIGATION AND DEBRIDEMENT LEFT FOOT  DEBRIDEMENT LATERAL FOOT WOUND;  Surgeon: Park Liter, DPM;  [facility];  Service: Podiatry;  Laterality: Left;   I & D EXTREMITY  Left 10/28/2018   Procedure: IRRIGATION AND DEBRIDEMENT LEFT TOE;  Surgeon: Park Liter, DPM;  [facility];  Service: Podiatry;  Laterality: Left;   I & D EXTREMITY Left 09/14/2020   Procedure: IRRIGATION AND DEBRIDEMENT WRIST;  Surgeon: Betha Loa, MD;  Location: [facility] OR;  Service: Orthopedics;  Laterality: Left;   INSERTION OF DIALYSIS CATHETER     Right subclavian   IR AV DIALY SHUNT INTRO NEEDLE/INTRACATH INITIAL W/PTA/IMG RIGHT Right 02/05/2018   IR FLUORO GUIDE CV LINE LEFT  04/14/2023   IR  FLUORO GUIDE CV LINE LEFT  07/16/2023   IR FLUORO GUIDE CV LINE RIGHT  01/31/2020   IR FLUORO GUIDE CV LINE RIGHT  01/30/2022   IR PTA VENOUS EXCEPT DIALYSIS CIRCUIT  01/30/2022   IR REMOVAL TUN CV CATH W/O FL  04/12/2023   IR THROMBECTOMY AV FISTULA W/THROMBOLYSIS/PTA INC/SHUNT/IMG LEFT Left 08/24/2018   IR THROMBECTOMY AV FISTULA W/THROMBOLYSIS/PTA INC/SHUNT/IMG LEFT Left 01/06/2019   IR US GUIDE VASC ACCESS LEFT  08/24/2018   IR US GUIDE VASC ACCESS LEFT  04/14/2023   IR US GUIDE VASC ACCESS LEFT  07/16/2023   IR US GUIDE VASC ACCESS LEFT  07/16/2023   IR US GUIDE VASC ACCESS RIGHT  02/05/2018   IR US GUIDE VASC ACCESS RIGHT  01/31/2020   IR US GUIDE VASC ACCESS RIGHT  07/16/2023   IR VENO/JUGULAR LEFT  07/16/2023   IR VENO/JUGULAR RIGHT  07/16/2023   IR VENOCAVAGRAM SVC  01/30/2022   IRRIGATION AND DEBRIDEMENT FOOT Right 10/23/2018   Procedure: Irrigation and Debridement to tendon, Left Foot;  Surgeon: Park Liter, DPM;  [facility];  Service: Podiatry;  Laterality: Right;   IRRIGATION AND DEBRIDEMENT FOOT Left 11/01/2018   Procedure: IRRIGATION AND DEBRIDEMENT PARTIAL WOUND CLOSURE LOCAL TISSUE TRANSFER AND FLAP ROTATION, LEFT FOOT;  Surgeon: Park Liter, DPM;  [facility]: [facility];  Service: Podiatry;  Laterality: Left;   TRANSESOPHAGEAL ECHOCARDIOGRAM (CATH LAB) N/A 07/14/2023   Procedure: TRANSESOPHAGEAL ECHOCARDIOGRAM;  Surgeon: Thomasene Ripple, DO;  [facility] INVASIVE CV LAB;  Service: Cardiovascular;  Laterality: N/A;   TRANSMETATARSAL AMPUTATION N/A 08/18/2018   Procedure: IRRIGATION AND DEBRIDEMENT OF LEFT 5TH TOE AND TRANSMETATARSAL, WITH PARTICAL LEFT 5TH TOE AND METATARSAL AMPUTATION, BONE BIOPSY, WOUND VAC APPLICATION.;  Surgeon: Park Liter, DPM;  Location: [facility] OR;  Service: Podiatry;  Laterality: N/A;   UPPER EXTREMITY VENOGRAPHY N/A 11/16/2016   Procedure: Upper Extremity Venography - Right Central;  Surgeon: Sherren Kerns, MD;  Location: [facility] INVASIVE CV LAB;  Service:  Cardiovascular;  Laterality: N/A;   UPPER EXTREMITY VENOGRAPHY N/A 05/25/2018   Procedure: UPPER EXTREMITY VENOGRAPHY - Bilateral;  Surgeon: Cephus Shelling, MD;  [facility]: [facility] INVAS[facility] CV LAB;  Service: Cardiovascular;  Laterality: N/A;    Family History  Problem Relation Age of Onset   Diabetes Mellitus II Other    Diabetes Father    Renal Disease Father        ESRD    Social History:  reports that he has never smoked. He has never used smokeless tobacco. He reports that he does not drink alcohol and does not use drugs.  Allergies: No Known Allergies  Medications: I have reviewed the patient's current medications.  Results for orders placed or performed during the hospital encounter of 07/09/23 (from the past 48 hour(s))  CBC     Status: Abnormal   Collection Time: 08/21/23  5:55 PM  Result Value Ref Range   WBC 21.1 (  H) 4.0 - 10.5 K/uL   RBC 3.01 (L) 4.22 - 5.81 MIL/uL   Hemoglobin 8.8 (L) 13.0 - 17.0 g/dL   HCT 11.9 (L) 14.7 - 82.9 %   MCV 92.7 80.0 - 100.0 fL   MCH 29.2 26.0 - 34.0 pg   MCHC 31.5 30.0 - 36.0 g/dL   RDW 56.2 (H) 13.0 - 86.5 %   Platelets 186 150 - 400 K/uL   nRBC 0.0 0.0 - 0.2 %    Comment: Performed at [facility], [address] [place], [name] [address]  Glucose, capillary     Status: Abnormal   Collection Time: 08/22/23  3:41 AM  Result Value Ref Range   Glucose-Capillary 164 (H) 70 - 99 mg/dL    Comment: Glucose reference range applies only to samples taken after fasting for at least 8 hours.   Comment 1 Notify RN   Magnesium     Status: Abnormal   Collection Time: 08/22/23  4:26 AM  Result Value Ref Range   Magnesium 2.6 (H) 1.7 - 2.4 mg/dL    Comment: Performed at [facility], [address], [place], [name] [address]  Glucose, capillary     Status: Abnormal   Collection Time: 08/22/23  8:05 AM  Result Value Ref Range   Glucose-Capillary 192 (H) 70 - 99 mg/dL    Comment: Glucose reference range applies only to samples  taken after fasting for at least 8 hours.  Glucose, capillary     Status: Abnormal   Collection Time: 08/22/23 11:58 AM  Result Value Ref Range   Glucose-Capillary 233 (H) 70 - 99 mg/dL    Comment: Glucose reference range applies only to samples taken after fasting for at least 8 hours.  Glucose, capillary     Status: Abnormal   Collection Time: 08/22/23  4:47 PM  Result Value Ref Range   Glucose-Capillary 418 (H) 70 - 99 mg/dL    Comment: Glucose reference range applies only to samples taken after fasting for at least 8 hours.  Glucose, random     Status: Abnormal   Collection Time: 08/22/23  5:13 PM  Result Value Ref Range   Glucose, Bld 406 (H) 70 - 99 mg/dL    Comment: Glucose reference range applies only to samples taken after fasting for at least 8 hours. Performed at [facility], [address], [place], [name] [address]   Glucose, capillary     Status: Abnormal   Collection Time: 08/22/23 10:12 PM  Result Value Ref Range   Glucose-Capillary 291 (H) 70 - 99 mg/dL    Comment: Glucose reference range applies only to samples taken after fasting for at least 8 hours.   Comment 1 Notify RN    Comment 2 Document in Chart   Glucose, capillary     Status: Abnormal   Collection Time: 08/23/23  1:10 AM  Result Value Ref Range   Glucose-Capillary 164 (H) 70 - 99 mg/dL    Comment: Glucose reference range applies only to samples taken after fasting for at least 8 hours.   Comment 1 Notify RN    Comment 2 Document in Chart   Glucose, capillary     Status: Abnormal   Collection Time: 08/23/23  4:52 AM  Result Value Ref Range   Glucose-Capillary 117 (H) 70 - 99 mg/dL    Comment: Glucose reference range applies only to samples taken after fasting for at least 8 hours.   Comment 1  Notify RN    Comment 2 Document in Chart   Glucose, capillary     Status: Abnormal   Collection Time: 08/23/23  8:32 AM  Result Value Ref Range   Glucose-Capillary 146 (H) 70 - 99 mg/dL    Comment:  Glucose reference range applies only to samples taken after fasting for at least 8 hours.  Glucose, capillary     Status: Abnormal   Collection Time: 08/23/23 12:47 PM  Result Value Ref Range   Glucose-Capillary 171 (H) 70 - 99 mg/dL    Comment: Glucose reference range applies only to samples taken after fasting for at least 8 hours.  Basic metabolic panel     Status: Abnormal   Collection Time: 08/23/23  2:18 PM  Result Value Ref Range   Sodium 131 (L) 135 - 145 mmol/L   Potassium 4.7 3.5 - 5.1 mmol/L   Chloride 91 (L) 98 - 111 mmol/L   CO2 22 22 - 32 mmol/L   Glucose, Bld 170 (H) 70 - 99 mg/dL    Comment: Glucose reference range applies only to samples taken after fasting for at least 8 hours.   BUN 98 (H) 6 - 20 mg/dL   Creatinine, Ser 1.61 (H) 0.61 - 1.24 mg/dL   Calcium 9.3 8.9 - 09.6 mg/dL   GFR, Estimated 10 (L) >60 mL/min    Comment: (NOTE) Calculated using the CKD-EPI Creatinine Equation (2021)    Anion gap 18 (H) 5 - 15    Comment: Performed at [facility], [address], [place], [name] [address]  CBC     Status: Abnormal   Collection Time: 08/23/23  2:18 PM  Result Value Ref Range   WBC 9.9 4.0 - 10.5 K/uL   RBC 3.43 (L) 4.22 - 5.81 MIL/uL   Hemoglobin 9.7 (L) 13.0 - 17.0 g/dL   HCT 98.1 (L) 19.1 - 47.8 %   MCV 92.7 80.0 - 100.0 fL   MCH 28.3 26.0 - 34.0 pg   MCHC 30.5 30.0 - 36.0 g/dL   RDW 29.5 (H) 62.1 - 30.8 %   Platelets 206 150 - 400 K/uL   nRBC 0.0 0.0 - 0.2 %    Comment: Performed at [facility], [address] [place], [name] [address]  Glucose, capillary     Status: Abnormal   Collection Time: 08/23/23  3:38 PM  Result Value Ref Range   Glucose-Capillary 165 (H) 70 - 99 mg/dL    Comment: Glucose reference range applies only to samples taken after fasting for at least 8 hours.    MR BRAIN WO CONTRAST  Result Date: 08/23/2023 CLINICAL DATA:  Stroke, follow-up. EXAM: MRI HEAD WITHOUT CONTRAST MRA HEAD WITHOUT CONTRAST TECHNIQUE:  Multiplanar, multi-echo pulse sequences of the brain and surrounding structures were acquired without intravenous contrast. Angiographic images of the Circle of Willis were acquired using MRA technique without intravenous contrast. COMPARISON:  Head CT August 20, 2023; MRI brain October 17 24. FINDINGS: MRI HEAD FINDINGS The study is significantly degraded by motion. Brain: A large necrotic appearing lesion is seen within the right frontal lobe within the area of prior right MCA territory infarct measuring approximately 5.3 x 3.7 x 3.7 cm. The lesion shows mixed content with areas of restricted and facilitated diffusion, and new prominent surrounding vasogenic edema which extends into the right basal ganglia, insular region, right frontal, parietal and temporal regions, resulting in significant mass effect with approximately a 8 mm leftward midline shift, similar  to prior CT. Although susceptibility weighted images are severely degraded by motion, low susceptibility artifact is seen in this location to suggest hemorrhagic transformation/intraparenchymal hematoma. There is mild dilatation of left lateral ventricle without evidence of entrapment. Small focus of susceptibility artifact in the left cerebellar hemisphere related to hemorrhagic transformation of late subacute infarct. Vascular: Major arterial flow voids are preserved at the skull base. Skull and upper cervical spine: Diffuse decrease of the T1 signal within the high versus and upper cervical spine, likely related to red marrow in the setting chronic anemia. Sinuses/Orbits: Apparent flattening of the optic discs suggesting increased intracranial pressure. Mild mucosal thickening throughout the paranasal sinuses. Bilateral mastoid effusion, right greater than left. Other: None. MRA HEAD FINDINGS The study is significantly degraded by motion. Anterior circulation: Normal flow related enhancement of the intracranial internal carotid arteries. Flow related  enhancement is seen in the bilateral M1 and A1 segments. Images above the M1 segment are essentially nondiagnostic. Although, there is asymmetry of flow related enhancement, diminished in the right MCA vascular tree compared to the left MCA. Posterior circulation: Flow related enhancement is present in the visualized portions of the intracranial vertebral arteries, basilar artery bilateral P1/PCA segments. Flow related enhancement is seen in the P2 segment with significant artifact at the level of the right P2 and bilateral distal PCA segments. IMPRESSION: 1. Large necrotic appearing lesion within the right frontal lobe within the area of prior right MCA territory infarct with new prominent surrounding vasogenic edema resulting in approximately 8 mm leftward midline shift. Given patient history of endocarditis, findings are concerning for abscess. 2. Significantly degraded MRA of the head due to motion. Asymmetry of flow related enhancement, diminished in the right MCA vascular tree compared to the left MCA. These results were called by telephone at the time of interpretation on 08/23/2023 at 1:37 pm to Dr. Marvel Plan, who verbally acknowledged these results. Electronically Signed   By: Baldemar Lenis M.D.   On: 08/23/2023 13:37   MR ANGIO HEAD WO CONTRAST  Result Date: 08/23/2023 CLINICAL DATA:  Stroke, follow-up. EXAM: MRI HEAD WITHOUT CONTRAST MRA HEAD WITHOUT CONTRAST TECHNIQUE: Multiplanar, multi-echo pulse sequences of the brain and surrounding structures were acquired without intravenous contrast. Angiographic images of the Circle of Willis were acquired using MRA technique without intravenous contrast. COMPARISON:  Head CT August 20, 2023; MRI brain October 17 24. FINDINGS: MRI HEAD FINDINGS The study is significantly degraded by motion. Brain: A large necrotic appearing lesion is seen within the right frontal lobe within the area of prior right MCA territory infarct measuring  approximately 5.3 x 3.7 x 3.7 cm. The lesion shows mixed content with areas of restricted and facilitated diffusion, and new prominent surrounding vasogenic edema which extends into the right basal ganglia, insular region, right frontal, parietal and temporal regions, resulting in significant mass effect with approximately a 8 mm leftward midline shift, similar to prior CT. Although susceptibility weighted images are severely degraded by motion, low susceptibility artifact is seen in this location to suggest hemorrhagic transformation/intraparenchymal hematoma. There is mild dilatation of left lateral ventricle without evidence of entrapment. Small focus of susceptibility artifact in the left cerebellar hemisphere related to hemorrhagic transformation of late subacute infarct. Vascular: Major arterial flow voids are preserved at the skull base. Skull and upper cervical spine: Diffuse decrease of the T1 signal within the high versus and upper cervical spine, likely related to red marrow in the setting chronic anemia. Sinuses/Orbits: Apparent flattening of the optic  discs suggesting increased intracranial pressure. Mild mucosal thickening throughout the paranasal sinuses. Bilateral mastoid effusion, right greater than left. Other: None. MRA HEAD FINDINGS The study is significantly degraded by motion. Anterior circulation: Normal flow related enhancement of the intracranial internal carotid arteries. Flow related enhancement is seen in the bilateral M1 and A1 segments. Images above the M1 segment are essentially nondiagnostic. Although, there is asymmetry of flow related enhancement, diminished in the right MCA vascular tree compared to the left MCA. Posterior circulation: Flow related enhancement is present in the visualized portions of the intracranial vertebral arteries, basilar artery bilateral P1/PCA segments. Flow related enhancement is seen in the P2 segment with significant artifact at the level of the right P2  and bilateral distal PCA segments. IMPRESSION: 1. Large necrotic appearing lesion within the right frontal lobe within the area of prior right MCA territory infarct with new prominent surrounding vasogenic edema resulting in approximately 8 mm leftward midline shift. Given patient history of endocarditis, findings are concerning for abscess. 2. Significantly degraded MRA of the head due to motion. Asymmetry of flow related enhancement, diminished in the right MCA vascular tree compared to the left MCA. These results were called by telephone at the time of interpretation on 08/23/2023 at 1:37 pm to Dr. Marvel Plan, who verbally acknowledged these results. Electronically Signed   By: Baldemar Lenis M.D.   On: 08/23/2023 13:37    Review of systems not obtained due to patient factors. Blood pressure (!) 145/97, pulse 92, temperature 98.1 F (36.7 C), temperature source Axillary, resp. rate 14, height 5\' 7"  (1.702 m), weight 75.3 kg, SpO2 100%. Patient is awake and alert.  He is reasonably oriented and appropriate.  Speech is fluent.  Right upper extremity function normal.  Right lower extremity function normal.  Left upper extremity plegic minimal movement with his left lower extremity.  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  I do not see a focal abscess for which I would recommend any type of surgical intervention.  I am worried that vancomycin alone may not adequately treat this problem and would recommend reconsulting infectious disease.  Kathaleen Maser Lorre Opdahl 08/23/2023, 4:42 PM

## 2023-08-23 NOTE — Progress Notes (Signed)
PROGRESS NOTE    [name]  ZOX:[id number] DOB: 03-20-1981 DOA: 07/09/2023 PCP: [name], NP   Brief Narrative:  [name] is an 42 y.o. male past medical history of end-stage renal disease on hemodialysis Monday Wednesday Friday, insulin-dependent diabetes mellitus, gastroparesis comes into close is: 07/08/2022 with altered mental status, she was recently seen in the the hospital on 04/09/2023 for sepsis with diabetic foot cultures grew MRSA during that time patient had catheter removed and replaced ID was consulted who recommended IV vancomycin.  Patient refused echo and left AGAINST MEDICAL ADVICE on 04/14/2023 was found confused by family members on 07/09/2023 brought into the ED and found to be encephalopathic with agitation, her blood glucose was 500 started on IV fluids and IV insulin for DKA CT of the head showed no acute findings.  CT scan of the abdomen and pelvis showed no acute findings but it did show hepatic steatosis, with a liver abnormality 7 x 4 cm septic with a white count of 20 was fluid resuscitated and started empirically on IV Vanco and Zosyn also.  Her blood pressure became soft PCCM was consulted for an ICU admission as she became hypoxic and worsen over night on 07/19/2023.  Hospital course 10/12 - Altered mental status, DKA 10/12 - Blood cultures positive for MRSA 10/13 - TTE LVEF 25-30% w/ severely depressed LVF, global hypokinesis, gd I diastolic dysfxn, mod RV fxn reduction. Possible filamentous structure on MV 10/16 - TEE LVEF 30-35%RV fxn nml. Noted masses on the mitral and tricuspid valves 10/16 - Seen by Cardiothoracic surg. Felt poor candidate. Recommended 6 weeks abx and recommended repeat TEE 1 week 10/17 - Acute right MCA infarct - left occipital and bilateral cerebellum felt c/w septic emboli. Neuro consulted.  10/18 - Tunneled left femoral vasc cath placed after catheter holiday  10/20 - 21 Worsening lethargy/confusion intubated overnight  20/21st 10/22 - TEE LVEF 35%, LV global hypokinesis. Very large mitral vegetation(mobile) - prolapsing into LV.  Increased pressors required. 10/23- per nursing less responsive than yesterday, increased ETT secretions. Clotting off CRRT. Ct head showing expected evolution of the right MCA stroke.  Cardiothoracic surgery re-consulted for worsening MVE. Felt too high risk here. Recommended referral to tertiary care  10/24 agitated when sedation decreased, is purposeful but does not follow commands. Tolerating UF gaols of -150. Still on NE infusion but extubated later that afternoon after passing SBT 10/25 failed swallow eval still on just 3 lpm O2. Encephalopathic. Added seroquel in effort to get him off the precedex. Cardiology told no beds at [facility]. Not clear if accepted or not. Worsening pressor requirements. Got blood for hgb 6. Cefepime resumed for clinical decline and leukocytosis  10/26 Hemoglobin again dropped to 6.3 overnight with increased pressor requirement yesterday afternoon now on vaso and levo. 10/27 Hypoglycemic episode overnight, mental status improved a little over weekend  10/28 Getting another unit of blood more awake.  Oriented x 2.  Still on Precedex.  Having liquid stools. 10/30 Off Precedex, low-dose levo.  Plan to transition to IHD 10/29 Cefepime for aspiration course completed.  10/31 Seen by overnight team for hypoxia and somnolence ABG with mild hypercapnia. Again declined by [facility] because they were at capacity. More awake. Doing better w/ Bed side swallow but still likely aspirating. CRRT stopped.  11/4 Remains on low dose levo 11/5-6; extubated, weaned off pressors and transferred to progressive care unit - [facility] assumed care 11/6 11/7-called [facility], at capacity.  Declined transfer 11/12-femoral line  removed; palliative care consulted for goals of care 11/12-[facility] declined transfer due to capacity 11/13 -15 No changes -pursuing LTACH placement - mother refusing PEG placement -  continue full scope of care  11/21 - Discontinue tube feeds (nepro/banatrol supplement) - follow for PO intake tolerance for continued dispo planning. Hypoglycemic due to poor PO intake - insulin discontinued/titrated down 11/22 - Rapid response early a.m. with worsening mental status - CT shows worsening MCA territory infarct with petechial hemorrhage - anticoagulation held. Neuro consulted. 11/23 - Current Lengthy discussion with patient's mother [name] - plan to move forward with SLP eval and if unable to tolerate appropriate calorie/fluid intake will plan for PEG later this week. 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)   Hemiparesis affecting left side as late effect of stroke ([name])   Dysphagia due to recent stroke   Physical deconditioning   Cavitary pneumonia   Subclavian vein occlusion, bilateral ([name])   Acute blood loss anemia   Fatty liver   Liver mass   Insulin dependent type 1 diabetes mellitus ([name])   Diabetic gastroparesis ([name])   Septic shock ([name])   Anemia of chronic disease   Iron deficiency anemia, unspecified   Secondary hyperparathyroidism of renal origin ([name])   Thrombocytopenia ([name])   Acute metabolic encephalopathy   MRSA bacteremia   Right elbow pain   ESRD on hemodialysis ([name])   Acute systolic CHF (congestive heart failure) ([name])   DCM (dilated cardiomyopathy) ([name])   Coronary artery calcification seen on CAT scan   Infective endocarditis of tricuspid valve   Protein-calorie malnutrition, severe   Hx of AKA (above knee amputation), left ([name])   Cerebrovascular accident (CVA) due to embolism  of cerebral artery ([name])   Acute right MCA CVA - expanding with new midline shift 08/20/23 Metabolic encephalopathy with delirium and agitation improving - Stat head CT for acute mental status changes shows expanding MCA territory infarct with petechial hemorrhage and midline shift (9mm) hold eliquis until cleared by neuro - Neurology reconsulted - appreciate insight/recs - continue to hold anticoagulation until repeat MRI/CTA can be performed - Previous MRI of the brain on 07/15/2023 favored septic emboli.  - Repeat MRI 08/23/2023 pending - Off all sedation/narcotics per discussion with family as of 08/19/23  Dysphagia, ongoing Mod-severe protein caloric malnutrition -Patient continues to have poor p.o. intake per staff, family continues to be somewhat hesitant for PEG tube placement SLP eval recommending NPO at this time - plan for PEG tube placement later this week given likelihood of advancing diet to appropriate caloric/fluid needs is low -Off narcotics, sedation, CNS depressants 08/19/23- these are likely not affecting his p.o. intake at this point despite prior concerns from family  Goals of care: -Overall poor prognosis - in the setting of worsening mental status and large MCA territory infarct with enlarged infarct 11/22 with petechial hemorrhage. -Currently has disposition is limited by poor p.o. intake and malnutrition, plan for PEG placement later this week if caloric/volume status cannot be tolerated PO - Ultimate plan to discharge to LTAC previously hindered by nutritional status -[facility] and [facility] declined patient transfer multiple times  Septic shock due to infective mitral and tricuspid endocarditis in the setting of MRSA bacteremia, resolved: Off pressors, on midodrine for dialysis only Has been denied for surgical intervention by [facility] surgery, [facility], and [facility]. Will need 6 weeks of vancomycin starting from 08/09/2023 (stop date 09/19/2023)  given with dialysis 3 times weekly.   Evolving cavitary pneumonia with mucous plugging: On IV vancomycin per ID recommendations as above   Acute on chronic combined systolic and diastolic heart failure/possible cardiogenic shock: 2D echo showed an EF of 30% and vegetation tricuspid and mitral valve with severe regurgitation. Continues on metoprolol Likely to require surgical intervention of valves per CT surgery - previously recommended transfer to tertiary center due to high risk of patient procedure. Patient unfortunately continues to be declined by [facility] denied for multiple prohibitive surgical risks Latest [facility] denial 08/10/2023 - call back once PEG placed - unclear if this will change overall trajectory given above  Ongoing sinus tachycardia: Continue metoprolol, Off narcotics Currently on oral midodrine.   Lower extremity DVT: Positive on 08/02/2023, DC eliquis given above petechial bleed on CT 08/20/23    End-stage renal disease on hemodialysis Monday Wednesday Friday: Nephrology following Status post line holiday without catheter removal on 07/14/2023. IR placed tunneled catheter on 07/16/2023.  Hyperglycemia/DKA/diabetes mellitus type 2, uncontrolled with hyperglycemia A1C 8.9 - continue sliding scale ongoing Off all scheduled insulin - hypoglycemia noted over past 12h - D5,1/2NS ongoing given NPO status and ongoing hypoglycemia today despite boluses.   Anemia/thrombocytopenia due to critical illness: Status post 5 units of packed red blood cells, hemoglobin has remains stable.   Incidental liver mass: Seen on CT scan of the abdomen pelvis 7 x 4 cm anterior liver mass. Will need MRI as an outpatient.   Hyponatremia: Further management per nephrology.    Stage IIc decubitus ulcer Pressure Injury 08/03/23 Buttocks Left Stage 2 -  Partial thickness loss of dermis presenting as a shallow open injury with a red, pink wound bed without slough. Several small open areas,  right buttocks area (Active)  08/03/23 1945  Location: Buttocks  Location Orientation: Left  Staging: Stage 2 -  Partial thickness loss of dermis presenting as a shallow open injury with a red, pink wound bed without slough.  Wound Description (Comments): Several small open areas, right buttocks area  Present on Admission: No (present on transfer to [facility])   DVT prophylaxis: Place and maintain sequential compression device Start: 07/30/23 1415   Code Status:   Code Status: Full Code  Family Communication: Attempted to call Mother and two other listed contacts to update them on his status change multiple times with no answer. Will try again this afternoon.  Status is: Inpt  Dispo: The patient is from: Home              Anticipated d/c is to: LTAC              Anticipated d/c date is: 24-48h              Patient currently IS medically stable for discharge  Consultants:  PCCM, neuro  Antimicrobials:  Vancomycin x6 weeks   Subjective: No acute issue/events overnight  Objective: Vitals:   08/22/23 1159 08/22/23 1650 08/22/23 2327 08/23/23 0345  BP: 100/68 (!) 144/85 (!) 144/94 (!) 152/93  Pulse: (!) 111 (!) 113 (!) 113 (!) 101  Resp: 17 18 18 17   Temp: 97.9 F (36.6 C) 97.9 F (36.6 C) 98.5 F (36.9 C)  98.1 F (36.7 C)  TempSrc: Oral Oral Oral Oral  SpO2: 100% 100% 100% 100%  Weight:      Height:       Intake/Output Summary (Last 24 hours) at 08/23/2023 0728 Last data filed at 08/22/2023 1242 Gross per 24 hour  Intake 120 ml  Output --  Net 120 ml   Filed Weights   08/19/23 0605 08/20/23 0345 08/21/23 0500  Weight: 74.4 kg 74.8 kg 75.3 kg   Examination:  General exam: Somnolent but arousable Respiratory system: Clear to auscultation. Respiratory effort normal. Cardiovascular system: S1 & S2 heard, RRR. No JVD, murmurs, rubs, gallops or clicks. No pedal edema. Gastrointestinal system: Abdomen is nondistended, soft and nontender. No organomegaly or masses felt.  Normal bowel sounds heard. Central nervous system: Awake, unable to answer questions appropriately. Extremities: Not following commands, difficult to assess.  Data Reviewed: I have personally reviewed following labs and imaging studies  CBC: Recent Labs  Lab 08/16/23 1615 08/17/23 1053 08/18/23 1533 08/20/23 0815 08/21/23 1556 08/21/23 1755  WBC 10.0 10.5 7.8 20.9* 21.8* 21.1*  NEUTROABS 7.2 8.8* 5.8  --   --   --   HGB 8.7* 10.3* 9.1* 10.2* 9.9* 8.8*  HCT 27.7* 32.5* 29.3* 31.9* 31.3* 27.9*  MCV 93.9 93.4 93.0 92.2 92.1 92.7  PLT 345 350 285 226 159 186   Basic Metabolic Panel: Recent Labs  Lab 08/16/23 1615 08/17/23 1053 08/18/23 0214 08/18/23 1245 08/19/23 0237 08/20/23 0222 08/20/23 1751 08/21/23 1556 08/22/23 0426 08/22/23 1713  NA 127* 126*  --  125*  --   --  131*  --   --   --   K 4.7 4.5  --  5.6*  --   --  5.0  --   --   --   CL 89* 90*  --  87*  --   --  94*  --   --   --   CO2 26 24  --  23  --   --  23  --   --   --   GLUCOSE 89 161*  --  401*  --   --  114*  --   --  406*  BUN 131* 64*  --  98*  --   --  94*  --   --   --   CREATININE 7.08* 4.54*  --  6.35*  --   --  6.70*  --   --   --   CALCIUM 9.7 9.0  --  8.8*  --   --  8.9  --   --   --   MG 3.2* 2.5* 2.7*  --  2.5* 2.6*  --  2.8* 2.6*  --   PHOS 5.2* 4.3  --   --   --   --  4.6  --   --   --    GFR: Estimated Creatinine Clearance: 13.3 mL/min (A) (by C-G formula based on SCr of 6.7 mg/dL (H)).  Liver Function Tests: Recent Labs  Lab 08/16/23 1615 08/17/23 1053 08/20/23 1751  AST 14* 37  --   ALT 11 10  --   ALKPHOS 136* 136*  --   BILITOT 0.8 0.9  --   PROT 7.1 8.2*  --   ALBUMIN 2.3* 2.6* 2.4*   CBG: Recent Labs  Lab 08/22/23 1158 08/22/23 1647 08/22/23 2212 08/23/23 0110 08/23/23 0452  GLUCAP 233* 418* 291* 164* 117*   No results found for this or any  previous visit (from the past 240 hour(s)).   Radiology Studies: No results found.  Scheduled Meds:  calcitRIOL  1.5  mcg Oral Q M,W,F   Chlorhexidine Gluconate Cloth  6 each Topical Daily   darbepoetin (ARANESP) injection - DIALYSIS  100 mcg Subcutaneous Q Fri-1800   feeding supplement  1 Container Oral TID BM   feeding supplement (NEPRO CARB STEADY)  500 mL Per Tube Q24H   feeding supplement (PROSource TF20)  60 mL Per Tube Daily   insulin aspart  0-9 Units Subcutaneous Q4H   insulin glargine-yfgn  15 Units Subcutaneous Daily   metoprolol tartrate  50 mg Oral BID   midodrine  10 mg Oral Q M,W,F-HD   multivitamin  1 tablet Oral QHS   nutrition supplement (JUVEN)  1 packet Per Tube BID BM   mouth rinse  15 mL Mouth Rinse 4 times per day   Continuous Infusions:  vancomycin      LOS: 44 days   Time spent:  Azucena Fallen, DO [facility]  If 7PM-7AM, please contact night-coverage [email or web address]  08/23/2023, 7:28 AM

## 2023-08-23 NOTE — Progress Notes (Addendum)
Nutrition Follow-up  DOCUMENTATION CODES:   Severe malnutrition in context of chronic illness  INTERVENTION:   -Regular diet per SLP recommendation.   -Discontinue Boost Breeze, product does not thicken appropriately for nectar consistency.  -Initiate tube feeding via Cortrak: Nepro Carb Steady at 50 ml/h (500 ml per day) Prosource TF20 60 ml daily  Provides 1080 kcal, 60.5 gm protein, 364 ml free water daily  -Recommend continue tube feeding until patient intakes consistently >65% estimated needs.  -Continue 1 packet Juven BID, each packet provides 95 calories, 2.5 grams of protein (collagen), and 9.8 grams of carbohydrate (3 grams sugar); also contains 7 grams of L-arginine and L-glutamine, 300 mg vitamin C, 15 mg vitamin E, 1.2 mcg vitamin B-12, 9.5 mg zinc, 200 mg calcium, and 1.5 g  Calcium Beta-hydroxy-Beta-methylbutyrate to support wound healing     NUTRITION DIAGNOSIS:   Severe Malnutrition related to chronic illness as evidenced by severe fat depletion, severe muscle depletion.  -addressing with enteral feeding, diet advancement to regular today.  GOAL:   Patient will meet greater than or equal to 90% of their needs  -Progressing   MONITOR:   PO intake, Labs, I & O's, Weight trends, Skin, TF tolerance  REASON FOR ASSESSMENT:   Follow up    ASSESSMENT:   42 y/o male with h/o CHF, ESRD on HD, DM, R AKA and gastroparesis who is admitted with DKA, AMS, septic shock, MRSA bacteremia, endocarditis of mitral valve, cavitary pneumonia and combined systolic/diastolic heart failure with cardiogenic shock complicatedby CVA noted on 10/17, thrombocytopenia and liver mass.  10/22 - CRRT start, TEE confirming large MV vegetation with AML perforation 10/24 - extubated 10/25 - Cortrak placed (tip near pylorus) 10/30 - CRRT off 11/03 - TF changed to Nepro 11/05 - Cortrak clogged and removed, replaced with NG tube 11/06 - pt removed NG tube, Cortrak placed (tip distal  stomach) 11/08 - diet advanced to dysphagia 1 with nectar-thick liquids 11/10 - TF stopped due to high blood sugar 11/11 - TF resumed 11/18: Regular diet, nectar thick liquids 11/19 - Nocturnal tube feeds started 11/20 - Transitioned back to 24 hour continuous tube feeds due to poor PO intake 11/21-Tube feeding discontinued in effort to improve PO intake, insulin discontinued/titrated down due to hypoglycemia. 11/22-Rapid Response in early morning with worsening mental status-CT revealed worsening MCA territory infarct with petechial hemorrhage, anticoagulant held. 11/23-Patient's Mom agreed to PEG placement later this week if patient unable to maintain adequate nutrition and hydration. 11/24-tube feeding resumed on nocturnal schedule 11/25-diet advancement to regular with nectar thick (moderately thick liquids)  Patient is conversing although he does not answer questions directly for this Clinical research associate. Diet advanced to regular by SLP for L today, B tray of pureed foods barely touched. Intakes recorded average 38% x 8 meals, he did not take Magic Cup at B. Juven admin via feeding tube.  Tube feeding of Nepro Carb steady given nocturnally at 50 mL/hr x 10 hrs. Current weight down 2.3 kg from admit. Edema non-pitting LUE.  Medications reviewed and include calcitriol, Darbepoetin Alfa, novolog SS Q4 hrs, insulin glargine-yfgn 15 units daily, Rena-Vit-1 Tab daily.  Labs: CBG 117-418 past 24 hrs   Intake/Output Summary (Last 24 hours) at 08/23/2023 1307 Last data filed at 08/23/2023 1000 Gross per 24 hour  Intake 300 ml  Output --  Net 300 ml   Net IO Since Admission: 19,502.05 mL [08/23/23 1307]    Diet Order:   Diet Order  Diet regular Room service appropriate? Yes with Assist; Fluid consistency: Nectar Thick  Diet effective now                   EDUCATION NEEDS:   Not appropriate for education at this time  Skin:   Skin Assessment: Skin Integrity Issues: Skin  Integrity Issues:: Stage II Stage II: buttocks  Last BM:  11/25, type 6-medium  Height:   Ht Readings from Last 1 Encounters:  07/19/23 5\' 7"  (1.702 m)    Weight:   Wt Readings from Last 1 Encounters:  08/21/23 75.3 kg    Ideal Body Weight:  61.9 kg (adjusted for L AKA)  BMI:  Body mass index is 26 kg/m.  Estimated Nutritional Needs:   Kcal:  2200-2500kcal/day  Protein:  120-135g/day  Fluid:  UOP +1L    Alvino Chapel, RDLD Clinical Dietitian See AMION for contact information

## 2023-08-23 NOTE — Plan of Care (Signed)
  Problem: Safety: Goal: Ability to remain free from injury will improve Outcome: Progressing   

## 2023-08-23 NOTE — Progress Notes (Signed)
Pharmacy Antibiotic Note  [name] is a 42 y.o. 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 Pharmacy has been consulted for Vancomycin dosing.   11/22 Vancomycin random pre-HD 28 above goal range (15-25). Patient received HD 11/22 and 11/23, vancomycin dose held 11/22 and given post HD 11/23. Planning next HD 11/26 then 11/29 due to the holiday.  Plan: - Vancomycin 500 mg IV post HD 11/26 then MWF  - Will monitor HD tolerance and dosing - Plan weekly levels on Friday with note  - Tentative stop date: 09/19/23 - entered    Height: 5\' 7"  (170.2 cm) Weight: 75.3 kg (166 lb) IBW/kg (Calculated) : 66.1  Temp (24hrs), Avg:98.1 F (36.7 C), Min:97.9 F (36.6 C), Max:98.5 F (36.9 C)  Recent Labs  Lab 08/16/23 1615 08/17/23 1053 08/18/23 1245 08/18/23 1533 08/20/23 0222 08/20/23 0815 08/20/23 1751 08/21/23 1556 08/21/23 1755  WBC 10.0 10.5  --  7.8  --  20.9*  --  21.8* 21.1*  CREATININE 7.08* 4.54* 6.35*  --   --   --  6.70*  --   --   VANCORANDOM  --   --   --   --  28  --   --   --   --     Estimated Creatinine Clearance: 13.3 mL/min (A) (by C-G formula based on SCr of 6.7 mg/dL (H)).    No Known Allergies  Antimicrobials this admission: Vancomycin 10/12 >>(12/22) Cefepime 10/21 >> 10/23; restart 10/25 >> 10/29  Vancomycin levels  -10/17 preHD>>25 -10/24: VR= 16 -11/1: VR= 24 -11/8 VR 22 -11/15 VR 22  -11/22 VR 28 - held dose x1, decrease to 500mg   Microbiology results: 10/12 MRSA PCR >> positive 10/12 CDiff antigen +, toxin -, PCR - >> negative 10/12 BCx >> 4/4 MRSA 10/13 BCx >> NGf 10/17 BCx >> NGf 10/21 RCx >> NOF  Thank you for allowing pharmacy to be a part of this patient's care.  Alphia Moh, PharmD, BCPS, BCCP Clinical Pharmacist  Please check AMION for all [facility] phone numbers After 10:00 PM, call Main Pharmacy [phone]

## 2023-08-23 NOTE — Progress Notes (Signed)
STROKE TEAM PROGRESS NOTE   BRIEF HPI [name] is a 42 y.o. male with history of  ESRD on HD MWF, right diabetic foot ulcer complicated by MRSA bacteremia 7/24 likely 2/2 to HD line infection, insulin dependent diabetes, gastroparesis, hx of left AKA, who presents with AMS on 10/11 and found to have septic shock with hypotension, leukocytosis and encephalopathy. He was admitted in July with MRSA bacteremia. He was treated with vancomycin, refused echo and left AMA.  Blood cultures this admission grew MRSA bacteremia. TEE showed large vegetation measuring 2 cm x 0.8 cm on the posterior mitral valve leaflet extending into the mitral valve annulus with high mobility. Also 2 mobile masses on the anterior leaflet of the tricuspid valve and a 3rd mass on the posterior tricuspid valve. He mentioned left arm weakness for some time. MRI completed and showed right MCA infarct with acute infarcts in left occipital and bilateral cerebellum.  On 11/22, patient had worsening mental status and repeat head CT demonstrated worsening right MCA territory infarct with petechial hemorrhage and subarachnoid hemorrhage along with 9 mm midline shift to the right, stroke team was reconsulted.  Hemorrhagic transformation likely occurred in setting of Eliquis use for DVT.  Eliquis should remain stopped at this time.  Will obtain MRI to further characterize whether hemorrhagic transformation is seen on CT versus edema.   SIGNIFICANT HOSPITAL EVENTS 10/12 patient admitted with altered mental status and DKA 10/13 patient was found to have endocarditis 10/16 felt poor candidate for cardiac surgery 10/17 patient had acute right MCA infarct, likely due to septic emboli 10/20 intubated and placed on CRRT 10/28 patient now extubated, had multiple episodes of hypoglycemia in past few days 11/13 pursuing LTAC placement but mother refusing PEG tube 11/22 worsening mental status noted and stat head CT demonstrates worsening right  MCA territory infarct as well as petechial hemorrhage, subarachnoid hemorrhage and 9 mm midline shift to the left.  EEG demonstrates no seizures.  INTERIM HISTORY/SUBJECTIVE Patient aunt is at the bedside. Pt received ativan for MRI brain and currently still drowsy sleepy, open eyes on stimulation but not able to keep eyes open without stimulation. Still has left sided weakness. MRI concerning for right brain abscess. NSG Dr. Dutch Quint is consulted.   OBJECTIVE  CBC    Component Value Date/Time   WBC 21.1 (H) 08/21/2023 1755   RBC 3.01 (L) 08/21/2023 1755   HGB 8.8 (L) 08/21/2023 1755   HGB 9.7 (L) 08/22/2014 0447   HCT 27.9 (L) 08/21/2023 1755   HCT 30.0 (L) 08/22/2014 0447   PLT 186 08/21/2023 1755   PLT 158 08/22/2014 0447   MCV 92.7 08/21/2023 1755   MCV 97 08/22/2014 0447   MCH 29.2 08/21/2023 1755   MCHC 31.5 08/21/2023 1755   RDW 16.5 (H) 08/21/2023 1755   RDW 15.3 (H) 08/22/2014 0447   LYMPHSABS 1.0 08/18/2023 1533   LYMPHSABS 1.7 08/22/2014 0447   MONOABS 0.6 08/18/2023 1533   MONOABS 0.8 08/22/2014 0447   EOSABS 0.4 08/18/2023 1533   EOSABS 0.4 08/22/2014 0447   BASOSABS 0.0 08/18/2023 1533   BASOSABS 0.1 08/22/2014 0447    BMET    Component Value Date/Time   NA 131 (L) 08/20/2023 1751   NA 138 08/22/2014 0447   K 5.0 08/20/2023 1751   K 4.0 08/22/2014 0447   CL 94 (L) 08/20/2023 1751   CL 100 08/22/2014 0447   CO2 23 08/20/2023 1751   CO2 30 08/22/2014 0447  GLUCOSE 406 (H) 08/22/2023 1713   GLUCOSE 88 08/22/2014 0447   BUN 94 (H) 08/20/2023 1751   BUN 14 08/22/2014 0447   CREATININE 6.70 (H) 08/20/2023 1751   CREATININE 5.00 (H) 08/22/2014 0447   CALCIUM 8.9 08/20/2023 1751   CALCIUM 8.2 (L) 08/22/2014 0447   GFRNONAA 10 (L) 08/20/2023 1751   GFRNONAA 14 (L) 08/22/2014 0447   GFRNONAA 5 (L) 06/19/2014 9147    IMAGING past 24 hours No results found.  Vitals:   08/22/23 2327 08/23/23 0345 08/23/23 0826 08/23/23 1100  BP: (!) 144/94 (!) 152/93 (!)  142/91 (!) 146/106  Pulse: (!) 113 (!) 101 (!) 101 94  Resp: 18 17 18 18   Temp: 98.5 F (36.9 C) 98.1 F (36.7 C) 98.1 F (36.7 C) 98.1 F (36.7 C)  TempSrc: Oral Oral Oral Axillary  SpO2: 100% 100% 100% 98%  Weight:      Height:         PHYSICAL EXAM Constitutional: Chronically ill-appearing patient with left AKA in no acute distress Cardiovascular: Normal rate and regular rhythm.  Respiratory: Effort normal, non-labored breathing.    NEURO (cooperation with exam limited):  Patient is alert and oriented to person and place but gets the month wrong by 1.  He is intermittently agitated but appears calm when talking on the phone family members.  Pupils equal round and reactive to light, extraocular movements intact, moves right upper and lower extremities spontaneously with antigravity strength, does not move left upper or lower extremities.   ASSESSMENT/PLAN  Concerning for brain abscess Mental status change on 11/22 CT scan 11/22: Extension of right MCA territory infarct now involving large portion of right frontal and parietal lobes as well as right basal ganglia with petechial hemorrhage and possible small volume subarachnoid hemorrhage MRI 11/25 concerning development of right brain abscess, formal report pending MRA pending Continue IV antibiotics NSG Dr. Dutch Quint consulted  Stroke: Large right MCA infarct and small left occipital lobe and bilateral cerebellum infarcts with petechial hemorrhage. etiology:  embolic in the setting of endocarditis with hemorrhagic transformation likely caused by Eliquis use CT head No acute abnormality, motion degraded   MRI 10/17 Sizable right MCA branch infarct affecting the upper division. Small acute infarcts in the left occipital lobe and bilateral cerebellum. Petechial hemorrhage is present without hematoma. No worrisome mass effect. MRA  Poor signal in a long segment of a right M2 branch, which is favored to be artifactual, but could  represent a true stenosis or occlusion Carotid Doppler unremarkable 2D Echo EF 25-30% and abnormal mitral valve TEE: EF 30-35%. Large mass on posterior mitral leaflet.  2 mobile masses on the anterior leaflet of the tricuspid valve with perforation.  Another mobile mass on the posterior tricuspid leaflet EEG no seizures or epileptiform discharges LDL 34 HgbA1c 8.9 VTE prophylaxis -SCDs No antithrombotic prior to admission, was on eliquis for DVT. Now AC on hold given new findings on CT and MRI.  Therapy recommendations: LTAC Disposition: Pending  Congestive Heart Failure Endocarditis TEE showed EF 30-35%. Large mass on posterior mitral leaflet.  2 mobile masses on the anterior leaflet of the tricuspid valve with perforation.  Another mobile mass on the posterior tricuspid leaflet CT surgery recommends conservative management with antibiotics Family is considering to arrange cardiac surgery in [facility] once stable.  Management per primary team and cardiology.   On vancomycin  MRSA bacteremia Line holiday completed ID following  Abx: Vancomycin  ESRD on HD MWF HD cath replaced Nephrology  following  BP management Was hypotensive but now normalized on midodrine BP goal is normotension  Left upper extremity DVT On 11/4, patient noted to have a DVT in left internal jugular vein, left subclavian vein and left axillary vein Was on Eliquis  Hold off [facility] for now given findings on CT and MRI.  Diabetes type II Uncontrolled Home meds:  Novolin HgbA1c 8.9, goal < 7.0 CBGs SSI Recommend close follow-up with PCP for better DM control  Dysphagia Patient has post-stroke dysphagia Now on dys1 and nectar thick liquis Encourage po intake Change continuous TF to nocturnal TF Remove cortrak as able  Other acute issues Hyponatremia, sodium 131--125--131 Leukocytosis WBC 20.5--15.3--13.7--20.9--21.1  Hospital day # 48  I discussed with Dr. Dutch Quint and Dr. Natale Milch. I spent  extensive  face-to-face time with the patient, more than 50% of which was spent in counseling and coordination of care, reviewing test results, images and medication, and discussing the diagnosis, treatment plan and potential prognosis. This patient's care requiresreview of multiple databases, neurological assessment, discussion with family, other specialists and medical decision making of high complexity. I had long discussion with pt aunt at bedside, updated pt current condition, treatment plan and potential prognosis, and answered all the questions. She expressed understanding and appreciation.    Marvel Plan, MD PhD Stroke Neurology 08/23/2023 1:58 PM    To contact Stroke Continuity provider, please refer to [email or web address]. After hours, contact General Neurology

## 2023-08-23 NOTE — Progress Notes (Signed)
[facility] Progress Note    Assessment/ Plan:   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 Not a candidate for surgery; CT surgery recommended management w/ antibiotics. SP line holiday w/ old TDC removed 10/16 and new TDC placed 10/18 by IR.  Needs IV vanc for 6 wks thru 09/20/23.  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 Repeat CT 11/22 shows extension of R MCA territory now involving large portion of right frontal and parietal lobes as well as right basal ganglia with petechial hemorrhage and possible small volume subarachnoid hemorrhage. Eliquis is now on hold and neuro seeing patient  ESRD: on HD MWF. SP CRRT 10/22- 10/30, back on iHD now. Had HD Friday and Saturday which was a mistake. Does not need HD until Tuesday 11/26, plan HD for tomorrow then Friday given holiday sched Volume: Evan 2L O2 dc'd on 11/10, remains on RA. Euvolemic on exam except LUE edema. New dry wt 2-3 kg lower. .  Anemia: Hb 7- 9 range. ESA dose increased to 100 mcg q. weekly. 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 Monitor on renal diet.  On calcitriol for PTH control. Nutrition: cortrak in place, getting tube feeds, not eating much per RN Hypotension: Weaned off of pressors. On midodrine with HD HD access: SP line holiday with new L  femoral TDC placed on 10/18. 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 Dispo: he successfully dialyzed in a chair 11/18. Will not need to do this again unless there is some clinical change. Ok for dc to SNF.   Subjective:   Patient seen and examined bedside. No acute events. A lot of tangential thinking/numerous complaints: reports he is being discharged today, talking about diet and keeping food warm, asking if we get checks for dialysis (?)   Objective:   BP (!) 142/91 (BP Location: Right Arm)   Pulse (!) 101   Temp 98.1 F (36.7 C)  (Oral)   Resp 18   Ht 5\' 7"  (1.702 m)   Wt 75.3 kg   SpO2 100%   BMI 26.00 kg/m   Intake/Output Summary (Last 24 hours) at 08/23/2023 0853 Last data filed at 08/22/2023 1242 Gross per 24 hour  Intake 120 ml  Output --  Net 120 ml   Weight change:   Physical Exam: Gen: NAD HEENT: +NGT CVS: RRR Resp: CTA B/L Abd: soft, ND Ext: trace lue edema, left aka Neuro: awake Dialysis access: left fem TDC c/d/i  Imaging: No results found.  Labs: BMET Recent Labs  Lab 08/16/23 1615 08/17/23 1053 08/18/23 1245 08/20/23 1751 08/22/23 1713  NA 127* 126* 125* 131*  --   K 4.7 4.5 5.6* 5.0  --   CL 89* 90* 87* 94*  --   CO2 26 24 23 23   --   GLUCOSE 89 161* 401* 114* 406*  BUN 131* 64* 98* 94*  --   CREATININE 7.08* 4.54* 6.35* 6.70*  --   CALCIUM 9.7 9.0 8.8* 8.9  --   PHOS 5.2* 4.3  --  4.6  --    CBC Recent Labs  Lab 08/16/23 1615 08/17/23 1053 08/18/23 1533 08/20/23 0815 08/21/23 1556 08/21/23 1755  WBC 10.0 10.5 7.8 20.9* 21.8* 21.1*  NEUTROABS 7.2 8.8* 5.8  --   --   --   HGB 8.7* 10.3* 9.1* 10.2* 9.9* 8.8*  HCT 27.7* 32.5* 29.3* 31.9* 31.3* 27.9*  MCV 93.9 93.4 93.0 92.2 92.1 92.7  PLT 345 350 285 226 159 186    Medications:     calcitRIOL  1.5 mcg Oral Q M,W,F   Chlorhexidine Gluconate Cloth  6 each Topical Daily   darbepoetin (ARANESP) injection - DIALYSIS  100 mcg Subcutaneous Q Fri-1800   feeding supplement  1 Container Oral TID BM   feeding supplement (NEPRO CARB STEADY)  500 mL Per Tube Q24H   feeding supplement (PROSource TF20)  60 mL Per Tube Daily   insulin aspart  0-9 Units Subcutaneous Q4H   insulin glargine-yfgn  15 Units Subcutaneous Daily   metoprolol tartrate  50 mg Oral BID   midodrine  10 mg Oral Q M,W,F-HD   multivitamin  1 tablet Oral QHS   nutrition supplement (JUVEN)  1 packet Per Tube BID BM   mouth rinse  15 mL Mouth Rinse 4 times per day      Anthony Sar, MD [facility] 08/23/2023, 8:53 AM

## 2023-08-23 NOTE — Plan of Care (Signed)
Problem: Self-Care: Goal: Ability to participate in self-care as condition permits will improve Outcome: Progressing Goal: Verbalization of feelings and concerns over difficulty with self-care will improve Outcome: Progressing Goal: Ability to communicate needs accurately will improve Outcome: Progressing   Problem: Nutrition: Goal: Risk of aspiration will decrease Outcome: Progressing Goal: Dietary intake will improve Outcome: Progressing   Problem: Education: Goal: Knowledge of disease or condition will improve Outcome: Progressing Goal: Knowledge of secondary prevention will improve (MUST DOCUMENT ALL) Outcome: Progressing Goal: Knowledge of patient specific risk factors will improve Loraine Leriche N/A or DELETE if not current risk factor) Outcome: Progressing   Problem: Ischemic Stroke/TIA Tissue Perfusion: Goal: Complications of ischemic stroke/TIA will be minimized Outcome: Progressing   Problem: Coping: Goal: Will verbalize positive feelings about self Outcome: Progressing Goal: Will identify appropriate support needs Outcome: Progressing   Problem: Health Behavior/Discharge Planning: Goal: Ability to manage health-related needs will improve Outcome: Progressing Goal: Goals will be collaboratively established with patient/family Outcome: Progressing   Problem: Self-Care: Goal: Ability to participate in self-care as condition permits will improve Outcome: Progressing Goal: Verbalization of feelings and concerns over difficulty with self-care will improve Outcome: Progressing Goal: Ability to communicate needs accurately will improve Outcome: Progressing   Problem: Nutrition: Goal: Risk of aspiration will decrease Outcome: Progressing Goal: Dietary intake will improve Outcome: Progressing   Problem: Safety: Goal: Non-violent Restraint(s) Outcome: Progressing   Problem: Education: Goal: Knowledge of General Education information will improve Description:  Including pain rating scale, medication(s)/side effects and non-pharmacologic comfort measures Outcome: Progressing   Problem: Health Behavior/Discharge Planning: Goal: Ability to manage health-related needs will improve Outcome: Progressing   Problem: Clinical Measurements: Goal: Ability to maintain clinical measurements within normal limits will improve Outcome: Progressing Goal: Will remain free from infection Outcome: Progressing Goal: Diagnostic test results will improve Outcome: Progressing Goal: Respiratory complications will improve Outcome: Progressing Goal: Cardiovascular complication will be avoided Outcome: Progressing   Problem: Activity: Goal: Risk for activity intolerance will decrease Outcome: Progressing   Problem: Nutrition: Goal: Adequate nutrition will be maintained Outcome: Progressing   Problem: Coping: Goal: Level of anxiety will decrease Outcome: Progressing   Problem: Elimination: Goal: Will not experience complications related to bowel motility Outcome: Progressing Goal: Will not experience complications related to urinary retention Outcome: Progressing   Problem: Pain Management: Goal: General experience of comfort will improve Outcome: Progressing   Problem: Safety: Goal: Ability to remain free from injury will improve Outcome: Progressing   Problem: Skin Integrity: Goal: Risk for impaired skin integrity will decrease Outcome: Progressing

## 2023-08-23 NOTE — Progress Notes (Signed)
Speech Language Pathology Treatment: Dysphagia  Patient Details Name: [name] MRN: [id number] DOB: 04-Jan-1981 Today's Date: 08/23/2023 Time: 0102-7253 SLP Time Calculation (min) (ACUTE ONLY): 21 min  Assessment / Plan / Recommendation Clinical Impression  Mr. Mccarrick was alert and interactive this am.  He was assessed for readiness for diet advancement. He demonstrated active and thorough mastication of crackers with peanut butter with no oral residue in left cheek.  He drank 3 oz of nectar thick liquid without incident. There were no s/s of aspiration. Recommend advancing diet back to regular solids; continue nectar thick liquids for now.  Pt called his [name] and we discussed recommendations; she was pleased.  SLP will follow. D/W RN.   HPI HPI: Pt is a 42 yo male presenting 10/11 with AMS, DKA. Admitted for septic shock from MRSA bacteremia; infective endocarditis. LUE weakness was noted 10/17 and MRI confirmed sizable infarct in the R MCA branch as well as small acute infarcts in the L occipital lobe and bilateral cerebellum. Pt initially evaluated by SLP 10/18 and recommended to have Dys 3 diet and thin liquids due primarily oral symptoms. Pt had worsening hypoxia and AMS after vomiting on 10/21 requiring intubation 10/21-10/24. 10/22-10/30 CRRT. 11/08 MBS, dysphagia 1/nectar. 11/22 neuro changes with new head CT showing "1. Extension of the right MCA territory infarct which now involves a large portion of the right frontal and parietal lobes, as well as the right basal ganglia. There is petechial hemorrhage associated  with the area of infarct with possible small volume subarachnoid  hemorrhage along the lateral aspect.  2. Increased leftward midline shift measuring 9 mm. No evidence of  hydrocephalus at the time of exam."  PMH includes: ESRD, DM, gastroparesis, L AKA, HTN, recent admission in July for sepsis secondary to R diabetic foot ulcer (left AMA)      SLP Plan  Continue with  current plan of care      Recommendations for follow up therapy are one component of a multi-disciplinary discharge planning process, led by the attending physician.  Recommendations may be updated based on patient status, additional functional criteria and insurance authorization.    Recommendations  Diet recommendations: Regular;Nectar-thick liquid Liquids provided via: Cup;Straw Medication Administration: Whole meds with puree Supervision: Staff to assist with self feeding;Intermittent supervision to cue for compensatory strategies Compensations: Minimize environmental distractions Postural Changes and/or Swallow Maneuvers: Seated upright 90 degrees                  Oral care BID   Frequent or constant Supervision/Assistance Dysphagia, oropharyngeal phase (R13.12)     Continue with current plan of care    Janaa Acero L. Samson Frederic, MA CCC/SLP Clinical Specialist - Acute Care SLP Acute Rehabilitation Services Office number [phone]  [name]  08/23/2023, 9:53 AM

## 2023-08-24 DIAGNOSIS — G060 Intracranial abscess and granuloma: Secondary | ICD-10-CM | POA: Diagnosis not present

## 2023-08-24 DIAGNOSIS — R7881 Bacteremia: Secondary | ICD-10-CM | POA: Diagnosis not present

## 2023-08-24 DIAGNOSIS — B9562 Methicillin resistant Staphylococcus aureus infection as the cause of diseases classified elsewhere: Secondary | ICD-10-CM | POA: Diagnosis not present

## 2023-08-24 DIAGNOSIS — I059 Rheumatic mitral valve disease, unspecified: Secondary | ICD-10-CM | POA: Diagnosis not present

## 2023-08-24 LAB — MAGNESIUM: Magnesium: 3.1 mg/dL — ABNORMAL HIGH (ref 1.7–2.4)

## 2023-08-24 LAB — GLUCOSE, CAPILLARY
Glucose-Capillary: 142 mg/dL — ABNORMAL HIGH (ref 70–99)
Glucose-Capillary: 184 mg/dL — ABNORMAL HIGH (ref 70–99)
Glucose-Capillary: 184 mg/dL — ABNORMAL HIGH (ref 70–99)
Glucose-Capillary: 151 mg/dL — ABNORMAL HIGH (ref 70–99)
Glucose-Capillary: 178 mg/dL — ABNORMAL HIGH (ref 70–99)

## 2023-08-24 MED ORDER — APIXABAN 5 MG PO TABS
5.0000 mg | ORAL_TABLET | Freq: Two times a day (BID) | ORAL | Status: DC
  Administered 2023-08-24 – 2023-08-27 (×5): 5 mg via ORAL
  Filled 2023-08-24 (×7): qty 1

## 2023-08-24 MED ORDER — INSULIN ASPART 100 UNIT/ML IJ SOLN
0.0000 [IU] | Freq: Every day | INTRAMUSCULAR | Status: DC
  Administered 2023-08-26: 2 [IU] via SUBCUTANEOUS

## 2023-08-24 MED ORDER — INSULIN ASPART 100 UNIT/ML IJ SOLN
0.0000 [IU] | Freq: Three times a day (TID) | INTRAMUSCULAR | Status: DC
Start: 2023-08-24 — End: 2023-08-27
  Administered 2023-08-24 (×3): 3 [IU] via SUBCUTANEOUS
  Administered 2023-08-25: 8 [IU] via SUBCUTANEOUS
  Administered 2023-08-25: 5 [IU] via SUBCUTANEOUS
  Administered 2023-08-26: 3 [IU] via SUBCUTANEOUS

## 2023-08-24 NOTE — Plan of Care (Signed)
Problem: Self-Care: Goal: Ability to participate in self-care as condition permits will improve Outcome: Progressing Goal: Verbalization of feelings and concerns over difficulty with self-care will improve Outcome: Progressing Goal: Ability to communicate needs accurately will improve Outcome: Progressing   Problem: Nutrition: Goal: Risk of aspiration will decrease Outcome: Progressing Goal: Dietary intake will improve Outcome: Progressing   Problem: Education: Goal: Knowledge of disease or condition will improve Outcome: Progressing Goal: Knowledge of secondary prevention will improve (MUST DOCUMENT ALL) Outcome: Progressing Goal: Knowledge of patient specific risk factors will improve Loraine Leriche N/A or DELETE if not current risk factor) Outcome: Progressing   Problem: Ischemic Stroke/TIA Tissue Perfusion: Goal: Complications of ischemic stroke/TIA will be minimized Outcome: Progressing   Problem: Coping: Goal: Will verbalize positive feelings about self Outcome: Progressing Goal: Will identify appropriate support needs Outcome: Progressing   Problem: Health Behavior/Discharge Planning: Goal: Ability to manage health-related needs will improve Outcome: Progressing Goal: Goals will be collaboratively established with patient/family Outcome: Progressing   Problem: Self-Care: Goal: Ability to participate in self-care as condition permits will improve Outcome: Progressing Goal: Verbalization of feelings and concerns over difficulty with self-care will improve Outcome: Progressing Goal: Ability to communicate needs accurately will improve Outcome: Progressing   Problem: Nutrition: Goal: Risk of aspiration will decrease Outcome: Progressing Goal: Dietary intake will improve Outcome: Progressing   Problem: Safety: Goal: Non-violent Restraint(s) Outcome: Progressing   Problem: Education: Goal: Knowledge of General Education information will improve Description:  Including pain rating scale, medication(s)/side effects and non-pharmacologic comfort measures Outcome: Progressing   Problem: Health Behavior/Discharge Planning: Goal: Ability to manage health-related needs will improve Outcome: Progressing   Problem: Clinical Measurements: Goal: Ability to maintain clinical measurements within normal limits will improve Outcome: Progressing Goal: Will remain free from infection Outcome: Progressing Goal: Diagnostic test results will improve Outcome: Progressing Goal: Respiratory complications will improve Outcome: Progressing Goal: Cardiovascular complication will be avoided Outcome: Progressing   Problem: Activity: Goal: Risk for activity intolerance will decrease Outcome: Progressing   Problem: Nutrition: Goal: Adequate nutrition will be maintained Outcome: Progressing   Problem: Coping: Goal: Level of anxiety will decrease Outcome: Progressing   Problem: Elimination: Goal: Will not experience complications related to bowel motility Outcome: Progressing Goal: Will not experience complications related to urinary retention Outcome: Progressing   Problem: Pain Management: Goal: General experience of comfort will improve Outcome: Progressing   Problem: Safety: Goal: Ability to remain free from injury will improve Outcome: Progressing   Problem: Skin Integrity: Goal: Risk for impaired skin integrity will decrease Outcome: Progressing

## 2023-08-24 NOTE — Progress Notes (Addendum)
STROKE TEAM PROGRESS NOTE   BRIEF HPI [name] is a 42 y.o. male with history of  ESRD on HD MWF, right diabetic foot ulcer complicated by MRSA bacteremia 7/24 likely 2/2 to HD line infection, insulin dependent diabetes, gastroparesis, hx of left AKA, who presents with AMS on 10/11 and found to have septic shock with hypotension, leukocytosis and encephalopathy. He was admitted in July with MRSA bacteremia. He was treated with vancomycin, refused echo and left AMA.  Blood cultures this admission grew MRSA bacteremia. TEE showed large vegetation measuring 2 cm x 0.8 cm on the posterior mitral valve leaflet extending into the mitral valve annulus with high mobility. Also 2 mobile masses on the anterior leaflet of the tricuspid valve and a 3rd mass on the posterior tricuspid valve. He mentioned left arm weakness for some time. MRI completed and showed right MCA infarct with acute infarcts in left occipital and bilateral cerebellum.  On 11/22, patient had worsening mental status and repeat head CT demonstrated worsening right MCA territory infarct with petechial hemorrhage and subarachnoid hemorrhage along with 9 mm midline shift to the right, stroke team was reconsulted.  Hemorrhagic transformation likely occurred in setting of Eliquis use for DVT.  Eliquis should remain stopped at this time.  Will obtain MRI to further characterize whether hemorrhagic transformation is seen on CT versus edema.   SIGNIFICANT HOSPITAL EVENTS 10/12 patient admitted with altered mental status and DKA 10/13 patient was found to have endocarditis 10/16 felt poor candidate for cardiac surgery 10/17 patient had acute right MCA infarct, likely due to septic emboli 10/20 intubated and placed on CRRT 10/28 patient now extubated, had multiple episodes of hypoglycemia in past few days 11/13 pursuing LTAC placement but mother refusing PEG tube 11/22 worsening mental status noted and stat head CT demonstrates worsening right  MCA territory infarct as well as petechial hemorrhage, subarachnoid hemorrhage and 9 mm midline shift to the left.  EEG demonstrates no seizures. 11/25 MRI concerning for brain abscess. NSG on board 11/26 ID on board per NSG request  INTERIM HISTORY/SUBJECTIVE Patient brother is at the bedside. Pt aunt is on the phone. I updated her regarding possible brain infection and ID will come by to help out on the antibiotics.   OBJECTIVE  CBC    Component Value Date/Time   WBC 9.9 08/23/2023 1418   RBC 3.43 (L) 08/23/2023 1418   HGB 9.7 (L) 08/23/2023 1418   HGB 9.7 (L) 08/22/2014 0447   HCT 31.8 (L) 08/23/2023 1418   HCT 30.0 (L) 08/22/2014 0447   PLT 206 08/23/2023 1418   PLT 158 08/22/2014 0447   MCV 92.7 08/23/2023 1418   MCV 97 08/22/2014 0447   MCH 28.3 08/23/2023 1418   MCHC 30.5 08/23/2023 1418   RDW 16.1 (H) 08/23/2023 1418   RDW 15.3 (H) 08/22/2014 0447   LYMPHSABS 1.0 08/18/2023 1533   LYMPHSABS 1.7 08/22/2014 0447   MONOABS 0.6 08/18/2023 1533   MONOABS 0.8 08/22/2014 0447   EOSABS 0.4 08/18/2023 1533   EOSABS 0.4 08/22/2014 0447   BASOSABS 0.0 08/18/2023 1533   BASOSABS 0.1 08/22/2014 0447    BMET    Component Value Date/Time   NA 131 (L) 08/23/2023 1418   NA 138 08/22/2014 0447   K 4.7 08/23/2023 1418   K 4.0 08/22/2014 0447   CL 91 (L) 08/23/2023 1418   CL 100 08/22/2014 0447   CO2 22 08/23/2023 1418   CO2 30 08/22/2014 0447   GLUCOSE  170 (H) 08/23/2023 1418   GLUCOSE 88 08/22/2014 0447   BUN 98 (H) 08/23/2023 1418   BUN 14 08/22/2014 0447   CREATININE 6.60 (H) 08/23/2023 1418   CREATININE 5.00 (H) 08/22/2014 0447   CALCIUM 9.3 08/23/2023 1418   CALCIUM 8.2 (L) 08/22/2014 0447   GFRNONAA 10 (L) 08/23/2023 1418   GFRNONAA 14 (L) 08/22/2014 0447   GFRNONAA 5 (L) 06/19/2014 1610    IMAGING past 24 hours No results found.  Vitals:   08/24/23 1430 08/24/23 1500 08/24/23 1530 08/24/23 1600  BP: 133/87 (!) 134/94 117/71 132/60  Pulse: 98 97 (!) 103 96   Resp: 14 20 (!) 22 13  Temp:      TempSrc:      SpO2: 100% 98% 96% 100%  Weight:      Height:         PHYSICAL EXAM Constitutional: Chronically ill-appearing patient with left AKA in no acute distress Cardiovascular: Normal rate and regular rhythm.  Respiratory: Effort normal, non-labored breathing.    NEURO (cooperation with exam limited):  Patient is alert and oriented to person and place but gets the month wrong by 1.  He is intermittently agitated but appears calm when talking on the phone family members.  Pupils equal round and reactive to light, extraocular movements intact, moves right upper and lower extremities spontaneously with antigravity strength, does not move left upper or lower extremities.   ASSESSMENT/PLAN  Concerning for brain abscess Mental status change on 11/22 CT scan 11/22: Extension of right MCA territory infarct now involving large portion of right frontal and parietal lobes as well as right basal ganglia with petechial hemorrhage and possible small volume subarachnoid hemorrhage MRI 11/25 Large necrotic appearing lesion within the right frontal lobe within the area of prior right MCA territory infarct with new prominent surrounding vasogenic edema resulting in approximately 8 mm leftward midline shift. Given patient history of endocarditis, findings are concerning for abscess. MRA Significantly degraded MRA of the head due to motion. Asymmetry of flow related enhancement, diminished in the right MCA vascular tree compared to the left MCA. NSG Dr. Dutch Quint consulted and felt no surgical indication at this time ID recommend to continue vanco with HD and add linezolid for CNS converage x 2 weeks and repeat MRI brain.   Stroke: Large right MCA infarct and small left occipital lobe and bilateral cerebellum infarcts with petechial hemorrhage. etiology:  embolic in the setting of endocarditis with hemorrhagic transformation likely caused by Eliquis use CT head No  acute abnormality, motion degraded   MRI 10/17 Sizable right MCA branch infarct affecting the upper division. Small acute infarcts in the left occipital lobe and bilateral cerebellum. Petechial hemorrhage is present without hematoma. No worrisome mass effect. MRA  Poor signal in a long segment of a right M2 branch, which is favored to be artifactual, but could represent a true stenosis or occlusion Carotid Doppler unremarkable 2D Echo EF 25-30% and abnormal mitral valve TEE: EF 30-35%. Large mass on posterior mitral leaflet.  2 mobile masses on the anterior leaflet of the tricuspid valve with perforation.  Another mobile mass on the posterior tricuspid leaflet EEG no seizures or epileptiform discharges LDL 34 HgbA1c 8.9 VTE prophylaxis -SCDs No antithrombotic prior to admission, was on eliquis for DVT. Given MRI low suspicious for hemorrhagic conversion, recommend to resume eliquis.   Therapy recommendations: LTAC Disposition: Pending  Congestive Heart Failure Endocarditis TEE showed EF 30-35%. Large mass on posterior mitral leaflet.  2 mobile masses  on the anterior leaflet of the tricuspid valve with perforation.  Another mobile mass on the posterior tricuspid leaflet CT surgery recommends conservative management with antibiotics Family is considering to arrange cardiac surgery in [facility] once stable.  Management per primary team and cardiology.   On vancomycin  MRSA bacteremia Line holiday completed ID on board Leukocytosis WBC 20.5--15.3--13.7--20.9--21.1--9.9 Abx: Vancomycin  ESRD on HD MWF HD cath replaced On HD Nephrology following  BP management Was hypotensive but now normalized on midodrine BP goal is normotension  Left upper extremity DVT On 11/4, patient noted to have a DVT in left internal jugular vein, left subclavian vein and left axillary vein Was on Eliquis  OK to resume eliquis from neuro standpoint.  Diabetes type II Uncontrolled Home meds:  Novolin HgbA1c  8.9, goal < 7.0 CBGs SSI Recommend close follow-up with PCP for better DM control  Dysphagia Patient has post-stroke dysphagia Now on dys1 and nectar thick liquis Encourage po intake Change continuous TF to nocturnal TF Remove cortrak as able  Other acute issues Hyponatremia, sodium 131--125--131  Hospital day # 45  Neurology will sign off. Please call with questions. Pt will follow up with stroke clinic NP at [facility] in about 4 weeks after discharge. Thanks for the consult.  Marvel Plan, MD PhD Stroke Neurology 08/24/2023 4:03 PM    To contact Stroke Continuity provider, please refer to [email or web address]. After hours, contact General Neurology

## 2023-08-24 NOTE — Progress Notes (Signed)
Occupational Therapy Treatment Patient Details Name: [name] MRN: [id number] DOB: December 17, 1980 Today's Date: 08/24/2023   History of present illness Patient is a 42 y/o male admitted 07/09/23 with confusion found to be in septic shock found to have MRSA in blood cultures, in DKA, TEE positive for vegitation on multiple valves, MRI showing R MCA infarct.  Pt with resp failure on 10/21 resulting in intubation and transfer to [facility]; extubated 10/24.  CRRT started 10/22-stopped 10/30.  PMH positive for ESRD on HD, IDDM, gastroparesis, L AKA, HTN.   OT comments  This 42 yo seen in conjunction with PT today to see if we could progress mobility. Upon entry he was laying sideways in the bed with all 4 bed rails up. He needed +2 total A to come up to sit EOB from this position and +2 total A to scoot back up on the bed more. His sitting balance at EOB varied from CGA to total A and pt is very easily distracted thus not helping with his balance. He says he will not fall, but he cannot self correct when he leans to the left. Pt with moments of agitation but able to re-direct easily. He will continue to benefit from acute OT with follow up at from continued inpatient follow up therapy, <3 hours/day.       If plan is discharge home, recommend the following:  Two people to help with walking and/or transfers;A lot of help with bathing/dressing/bathroom;Assistance with cooking/housework;Assistance with feeding;Help with stairs or ramp for entrance;Assist for transportation;Direct supervision/assist for financial management;Direct supervision/assist for medications management   Equipment Recommendations  Other (comment) (TBD next venue)       Precautions / Restrictions Precautions Precautions: Fall Precaution Comments: LAKA, L hemiparesis, coretrak, blood clot LUE Restrictions Weight Bearing Restrictions: Yes LLE Weight Bearing: Non weight bearing       Mobility Bed Mobility Overal bed mobility:  Needs Assistance Bed Mobility: Supine to Sit, Sit to Supine Rolling: Max assist   Supine to sit: +2 for physical assistance, Total assist, HOB elevated Sit to supine: Total assist, +2 for physical assistance   General bed mobility comments: cues for sequencing and hand over hand assist, total A +2 to complete       Balance Overall balance assessment: Needs assistance Sitting-balance support: Single extremity supported, Bilateral upper extremity supported, Feet supported Sitting balance-[name]: Poor Sitting balance - Comments: Able to achieve upright positioning EOB with SBA briefly 50% of the time was mod A, improved with placing RUE on chair in front of P or laterally R and anterior on armrest of reclienr to facilitate R lateral lean Postural control: Left lateral lean                                 ADL either performed or assessed with clinical judgement   ADL  Able to self feed at bed level once tray set up for him                                            Extremity/Trunk Assessment Upper Extremity Assessment Upper Extremity Assessment: Left hand dominant LUE Deficits / Details: No active movement noted, 2+ finger sublux when arm in dependent position, edema continues LUE Coordination: decreased fine motor;decreased gross motor  Vision Patient Visual Report: No change from baseline            Cognition Arousal: Alert Behavior During Therapy: Restless, Agitated Overall Cognitive Status: Impaired/Different from baseline Area of Impairment: Attention, Following commands, Safety/judgement, Awareness                     Memory: Decreased short-term memory Following Commands: Follows one step commands inconsistently Safety/Judgement: Decreased awareness of safety, Decreased awareness of deficits Awareness: Intellectual Problem Solving: Slow processing, Decreased initiation, Difficulty sequencing, Requires verbal  cues, Requires tactile cues General Comments: Pt very distracted this session, periods of aggitation but redirectible,              General Comments VSS, RN in during session    Pertinent Vitals/ Pain       Pain Assessment Pain Assessment: No/denies pain         Frequency  Min 1X/week        Progress Toward Goals  OT Goals(current goals can now be found in the care plan section)  Progress towards OT goals: Progressing toward goals (slowly)  Acute Rehab OT Goals Patient Stated Goal: to get some ice for his drink (which he cannot have due to his swallowing precautions) OT Goal Formulation: With patient Time For Goal Achievement: 08/30/23 Potential to Achieve Goals: Fair  Plan      Co-evaluation    PT/OT/SLP Co-Evaluation/Treatment: Yes Reason for Co-Treatment: Necessary to address cognition/behavior during functional activity;For patient/therapist safety;To address functional/ADL transfers PT goals addressed during session: Mobility/safety with mobility;Balance OT goals addressed during session: ADL's and self-care;Strengthening/ROM      AM-PAC OT "6 Clicks" Daily Activity     Outcome Measure   Help from another person eating meals?: A Little (setup) Help from another person taking care of personal grooming?: A Lot Help from another person toileting, which includes using toliet, bedpan, or urinal?: Total Help from another person bathing (including washing, rinsing, drying)?: Total Help from another person to put on and taking off regular upper body clothing?: Total Help from another person to put on and taking off regular lower body clothing?: Total 6 Click Score: 9    End of Session    OT Visit Diagnosis: Other abnormalities of gait and mobility (R26.89);Other symptoms and signs involving the nervous system (R29.898);Hemiplegia and hemiparesis;Muscle weakness (generalized) (M62.81);Other symptoms and signs involving cognitive function Hemiplegia -  Right/Left: Left Hemiplegia - dominant/non-dominant: Non-Dominant Hemiplegia - caused by: Cerebral infarction   Activity Tolerance Patient tolerated treatment well   Patient Left in bed;with call bell/phone within reach;with bed alarm set   Nurse Communication Mobility status (check IV site)        Time: 4166-0630 OT Time Calculation (min): 23 min  Charges: OT General Charges $OT Visit: 1 Visit OT Treatments $Therapeutic Activity: 8-22 mins  [name] OT Acute Rehabilitation Services Office [phone]    [name] 08/24/2023, 1:55 PM

## 2023-08-24 NOTE — Progress Notes (Signed)
Received patient in bed to unit.  Alert and oriented.  Informed consent signed and in chart.   TX duration:3.25  Patient tolerated well.  Transported back to the room  Alert, without acute distress.  Hand-off given to patient's nurse.   Access used: left femoral hd catheter Access issues: ran with decreased bfr and alarmed throughout tx  Total UF removed: 2L Medication(s) given: vancomycin   08/24/23 1708  Vitals  Temp (!) 97.4 F (36.3 C)  Temp Source Oral  BP (!) 150/93  MAP (mmHg) 111  BP Location Right Arm  BP Method Automatic  Patient Position (if appropriate) Lying  Pulse Rate 98  Pulse Rate Source Monitor  ECG Heart Rate 96  Resp 17  Oxygen Therapy  SpO2 100 %  O2 Device Room Air  During Treatment Monitoring  Blood Flow Rate (mL/min) 299 mL/min  Arterial Pressure (mmHg) -150.09 mmHg  Venous Pressure (mmHg) 153.93 mmHg  TMP (mmHg) 6.46 mmHg  Ultrafiltration Rate (mL/min) 0 mL/min  Dialysate Flow Rate (mL/min) 300 ml/min  Dialysate Potassium Concentration 2  Dialysate Calcium Concentration 2.5  Duration of HD Treatment -hour(s) 3.22 hour(s)  Cumulative Fluid Removed (mL) per Treatment  2011.79  HD Safety Checks Performed Yes  Intra-Hemodialysis Comments Tx completed  Dialysis Fluid Bolus Normal Saline  Bolus Amount (mL) 300 mL      Rhandi Despain S [facility]

## 2023-08-24 NOTE — Progress Notes (Addendum)
PROGRESS NOTE    [name]  HQI:[id number] DOB: 05-03-1981 DOA: 07/09/2023 PCP: [name], NP   Brief Narrative:  [name] is an 42 y.o. male past medical history of end-stage renal disease on hemodialysis Monday Wednesday Friday, insulin-dependent diabetes mellitus, gastroparesis comes into close is: 07/08/2022 with altered mental status, she was recently seen in the the hospital on 04/09/2023 for sepsis with diabetic foot cultures grew MRSA during that time patient had catheter removed and replaced ID was consulted who recommended IV vancomycin.  Patient refused echo and left AGAINST MEDICAL ADVICE on 04/14/2023 was found confused by family members on 07/09/2023 brought into the ED and found to be encephalopathic with agitation, her blood glucose was 500 started on IV fluids and IV insulin for DKA CT of the head showed no acute findings.  CT scan of the abdomen and pelvis showed no acute findings but it did show hepatic steatosis, with a liver abnormality 7 x 4 cm septic with a white count of 20 was fluid resuscitated and started empirically on IV Vanco and Zosyn also.  Her blood pressure became soft PCCM was consulted for an ICU admission as she became hypoxic and worsen over night on 07/19/2023.  Hospital course 10/12 - Altered mental status, DKA 10/12 - Blood cultures positive for MRSA 10/13 - TTE LVEF 25-30% w/ severely depressed LVF, global hypokinesis, gd I diastolic dysfxn, mod RV fxn reduction. Possible filamentous structure on MV 10/16 - TEE LVEF 30-35%RV fxn nml. Noted masses on the mitral and tricuspid valves 10/16 - Seen by Cardiothoracic surg. Felt poor candidate. Recommended 6 weeks abx and recommended repeat TEE 1 week 10/17 - Acute right MCA infarct - left occipital and bilateral cerebellum felt c/w septic emboli. Neuro consulted.  10/18 - Tunneled left femoral vasc cath placed after catheter holiday  10/20 - 21 Worsening lethargy/confusion intubated overnight  20/21st 10/22 - TEE LVEF 35%, LV global hypokinesis. Very large mitral vegetation(mobile) - prolapsing into LV.  Increased pressors required. 10/23- per nursing less responsive than yesterday, increased ETT secretions. Clotting off CRRT. Ct head showing expected evolution of the right MCA stroke.  Cardiothoracic surgery re-consulted for worsening MVE. Felt too high risk here. Recommended referral to tertiary care  10/24 agitated when sedation decreased, is purposeful but does not follow commands. Tolerating UF gaols of -150. Still on NE infusion but extubated later that afternoon after passing SBT 10/25 failed swallow eval still on just 3 lpm O2. Encephalopathic. Added seroquel in effort to get him off the precedex. Cardiology told no beds at [facility]. Not clear if accepted or not. Worsening pressor requirements. Got blood for hgb 6. Cefepime resumed for clinical decline and leukocytosis  10/26 Hemoglobin again dropped to 6.3 overnight with increased pressor requirement yesterday afternoon now on vaso and levo. 10/27 Hypoglycemic episode overnight, mental status improved a little over weekend  10/28 Getting another unit of blood more awake.  Oriented x 2.  Still on Precedex.  Having liquid stools. 10/30 Off Precedex, low-dose levo.  Plan to transition to IHD 10/29 Cefepime for aspiration course completed.  10/31 Seen by overnight team for hypoxia and somnolence ABG with mild hypercapnia. Again declined by [facility] because they were at capacity. More awake. Doing better w/ Bed side swallow but still likely aspirating. CRRT stopped.  11/4 Remains on low dose levo 11/5-6; extubated, weaned off pressors and transferred to progressive care unit - [facility] assumed care 11/6 11/7-called [facility], at capacity.  Declined transfer 11/12-femoral line  removed; palliative care consulted for goals of care 11/12-[facility] declined transfer due to capacity 11/13 -15 No changes -pursuing LTACH placement - mother refusing PEG placement -  continue full scope of care  11/21 - Discontinue tube feeds (nepro/banatrol supplement) - follow for PO intake tolerance for continued dispo planning. Hypoglycemic due to poor PO intake - insulin discontinued/titrated down 11/22 - Rapid response early a.m. with worsening mental status - CT shows worsening MCA territory infarct with petechial hemorrhage - anticoagulation held. Neuro consulted. 11/23 - Current Lengthy discussion with patient's mother [name] - plan to move forward with SLP eval and if unable to tolerate appropriate calorie/fluid intake will plan for PEG later this week. 11/25 -MRI notes questionable abscess/cerebritis - NeuroSx not currently recommending intervention- ID consulted - recommending Linezolid x2 weeks - then reimage to evaluate progress. 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)   Hemiparesis affecting left side as late effect of stroke ([name])   Dysphagia due to recent stroke   Physical deconditioning   Cavitary pneumonia   Subclavian vein occlusion, bilateral ([name])   Acute blood loss anemia   Fatty liver   Liver mass   Insulin dependent type 1 diabetes mellitus ([name])   Diabetic gastroparesis ([name])   Septic shock ([name])   Anemia of chronic disease   Iron deficiency anemia, unspecified   Secondary hyperparathyroidism of renal origin ([name])   Thrombocytopenia ([name])   Acute metabolic encephalopathy   MRSA bacteremia   Right elbow pain   ESRD on hemodialysis ([name])   Acute systolic CHF (congestive heart failure) ([name])   DCM (dilated cardiomyopathy) ([name])   Coronary artery calcification seen on CAT scan   Infective endocarditis of tricuspid valve   Protein-calorie malnutrition, severe   Hx of AKA  (above knee amputation), left ([name])   Cerebrovascular accident (CVA) due to embolism of cerebral artery ([name])   Acute right MCA CVA - expanding with new midline shift 08/20/23 Metabolic encephalopathy with delirium and agitation improving - Stat head CT for acute mental status changes shows expanding MCA territory infarct with petechial hemorrhage and midline shift (9mm) hold eliquis until cleared by neuro - Neurology reconsulted - appreciate insight/recs - continue to hold anticoagulation until repeat MRI/CTA can be performed - Previous MRI of the brain on 07/15/2023 favored septic emboli.  - Repeat MRI 08/23/2023 abnormal (?abscess/cerebritis) - Off all sedation/narcotics per discussion with family as of 08/19/23  Cerebritis/Rule out abscess -NeuroSx evaluated - no indication for intervention at this time -Consult ID - recommend linezolid x2 weeks then repeat imaging  Dysphagia, improving Mod-severe protein caloric malnutrition -Patient continues to have poor p.o. intake per staff, family continues to be somewhat hesitant for PEG tube placement Patient now on regular diet/thickened liquids - plan for PEG tube placement later this week if calorie/fluid intake remains inappropriate -Off narcotics, sedation, CNS depressants 08/19/23- these are likely not affecting his p.o. intake at this point despite prior concerns from family  Goals of care: -Overall poor prognosis - in the setting of worsening mental status and large MCA territory infarct with enlarged infarct 11/22 with petechial hemorrhage. -Currently has disposition is limited by poor p.o. intake and malnutrition - Ultimate plan to discharge to SNF once diet is established and  stable from neuro/infectious standpoint -[facility] patient [facility] multiple times  Septic shock due to infective mitral and tricuspid endocarditis in the setting of MRSA bacteremia, resolved: Off pressors, on midodrine for dialysis  only Has been denied for surgical intervention by [facility], [facility], and [facility]. Initial plan: 6 weeks of vancomycin starting from 08/09/2023 (stop date 09/19/2023) given with dialysis 3 times weekly. Transitioning to linezolid 11/26 per ID   Evolving cavitary pneumonia with mucous plugging: On linezolid(previously vancomycin) per ID recommendations as above   Acute on chronic combined systolic and diastolic heart failure/possible cardiogenic shock: 2D echo showed an EF of 30% and vegetation tricuspid and mitral valve with severe regurgitation. Continues on metoprolol Likely to require surgical intervention of valves per CT surgery - previously recommended transfer to tertiary center due to high risk of patient procedure. Patient unfortunately continues to be declined by [facility] denied for multiple prohibitive surgical risks Latest [facility] denial 08/10/2023 - call back once PEG placed - unclear if this will change overall trajectory given above  Ongoing sinus tachycardia: Continue metoprolol, Off narcotics Currently on oral midodrine.   Lower extremity DVT: Positive on 08/02/2023, DC eliquis given above petechial bleed on CT 08/20/23    End-stage renal disease on hemodialysis Monday Wednesday Friday: Nephrology following Status post line holiday without catheter removal on 07/14/2023. IR placed tunneled catheter on 07/16/2023.  Hyperglycemia/DKA/diabetes mellitus type 2, uncontrolled with hyperglycemia A1C 8.9 - continue sliding scale ongoing Off all scheduled insulin - hypoglycemia noted over past 12h - D5,1/2NS ongoing given NPO status and ongoing hypoglycemia today despite boluses.   Anemia/thrombocytopenia due to critical illness: Status post 5 units of packed red blood cells, hemoglobin has remains stable.   Incidental liver mass: Seen on CT scan of the abdomen pelvis 7 x 4 cm anterior liver mass. Will need MRI as an outpatient.   Hyponatremia: Further  management per nephrology.    Stage IIc decubitus ulcer Pressure Injury 08/03/23 Buttocks Left Stage 2 -  Partial thickness loss of dermis presenting as a shallow open injury with a red, pink wound bed without slough. Several small open areas, right buttocks area (Active)  08/03/23 1945  Location: Buttocks  Location Orientation: Left  Staging: Stage 2 -  Partial thickness loss of dermis presenting as a shallow open injury with a red, pink wound bed without slough.  Wound Description (Comments): Several small open areas, right buttocks area  Present on Admission: No (present on transfer to [facility])   DVT prophylaxis: Place and maintain sequential compression device Start: 07/30/23 1415   Code Status:   Code Status: Full Code  Family Communication: Attempted to call Mother and two other listed contacts to update them on his status change multiple times with no answer. Will try again this afternoon.  Status is: Inpt  Dispo: The patient is from: Home              Anticipated d/c is to: LTAC              Anticipated d/c date is: 24-48h              Patient currently IS medically stable for discharge  Consultants:  PCCM, neuro  Antimicrobials:  Linezolid x 2 weeks (starting 11/26)  Subjective: No acute issue/events overnight  Objective: Vitals:   08/24/23 1323 08/24/23 1328 08/24/23 1333 08/24/23 1400  BP: (!) 163/110  (!) 157/137 (!) 145/89  Pulse: 97  94 93  Resp: (!) 21  (!) 23 17  Temp: 97.9 F (36.6 C)     TempSrc: Oral     SpO2: 99%  100% 99%  Weight:  78 kg    Height:       Intake/Output Summary (Last 24 hours) at 08/24/2023 1425 Last data filed at 08/24/2023 0900 Gross per 24 hour  Intake 480 ml  Output --  Net 480 ml   Filed Weights   08/20/23 0345 08/21/23 0500 08/24/23 1328  Weight: 74.8 kg 75.3 kg 78 kg   Examination:  General exam: Somnolent but arousable Respiratory system: Clear to auscultation. Respiratory effort normal. Cardiovascular system: S1 &  S2 heard, RRR. No JVD, murmurs, rubs, gallops or clicks. No pedal edema. Gastrointestinal system: Abdomen is nondistended, soft and nontender. No organomegaly or masses felt. Normal bowel sounds heard. Central nervous system: Awake, unable to answer questions appropriately. Extremities: Not following commands, difficult to assess.  Data Reviewed: I have personally reviewed following labs and imaging studies  CBC: Recent Labs  Lab 08/18/23 1533 08/20/23 0815 08/21/23 1556 08/21/23 1755 08/23/23 1418  WBC 7.8 20.9* 21.8* 21.1* 9.9  NEUTROABS 5.8  --   --   --   --   HGB 9.1* 10.2* 9.9* 8.8* 9.7*  HCT 29.3* 31.9* 31.3* 27.9* 31.8*  MCV 93.0 92.2 92.1 92.7 92.7  PLT 285 226 159 186 206   Basic Metabolic Panel: Recent Labs  Lab 08/18/23 1245 08/19/23 0237 08/20/23 0222 08/20/23 1751 08/21/23 1556 08/22/23 0426 08/22/23 1713 08/23/23 1418 08/24/23 0430  NA 125*  --   --  131*  --   --   --  131*  --   K 5.6*  --   --  5.0  --   --   --  4.7  --   CL 87*  --   --  94*  --   --   --  91*  --   CO2 23  --   --  23  --   --   --  22  --   GLUCOSE 401*  --   --  114*  --   --  406* 170*  --   BUN 98*  --   --  94*  --   --   --  98*  --   CREATININE 6.35*  --   --  6.70*  --   --   --  6.60*  --   CALCIUM 8.8*  --   --  8.9  --   --   --  9.3  --   MG  --  2.5* 2.6*  --  2.8* 2.6*  --   --  3.1*  PHOS  --   --   --  4.6  --   --   --   --   --    GFR: Estimated Creatinine Clearance: 13.5 mL/min (A) (by C-G formula based on SCr of 6.6 mg/dL (H)).  Liver Function Tests: Recent Labs  Lab 08/20/23 1751  ALBUMIN 2.4*   CBG: Recent Labs  Lab 08/23/23 2012 08/23/23 2341 08/24/23 0408 08/24/23 0858 08/24/23 1206  GLUCAP 219* 107* 142* 184* 184*   No results found for this or any previous visit (from the past 240 hour(s)).   Radiology Studies: MR BRAIN WO CONTRAST  Result Date: 08/23/2023 CLINICAL DATA:  Stroke, follow-up. EXAM: MRI HEAD WITHOUT CONTRAST MRA HEAD  WITHOUT CONTRAST TECHNIQUE: Multiplanar, multi-echo pulse sequences of the brain  and surrounding structures were acquired without intravenous contrast. Angiographic images of the Circle of Willis were acquired using MRA technique without intravenous contrast. COMPARISON:  Head CT August 20, 2023; MRI brain October 17 24. FINDINGS: MRI HEAD FINDINGS The study is significantly degraded by motion. Brain: A large necrotic appearing lesion is seen within the right frontal lobe within the area of prior right MCA territory infarct measuring approximately 5.3 x 3.7 x 3.7 cm. The lesion shows mixed content with areas of restricted and facilitated diffusion, and new prominent surrounding vasogenic edema which extends into the right basal ganglia, insular region, right frontal, parietal and temporal regions, resulting in significant mass effect with approximately a 8 mm leftward midline shift, similar to prior CT. Although susceptibility weighted images are severely degraded by motion, low susceptibility artifact is seen in this location to suggest hemorrhagic transformation/intraparenchymal hematoma. There is mild dilatation of left lateral ventricle without evidence of entrapment. Small focus of susceptibility artifact in the left cerebellar hemisphere related to hemorrhagic transformation of late subacute infarct. Vascular: Major arterial flow voids are preserved at the skull base. Skull and upper cervical spine: Diffuse decrease of the T1 signal within the high versus and upper cervical spine, likely related to red marrow in the setting chronic anemia. Sinuses/Orbits: Apparent flattening of the optic discs suggesting increased intracranial pressure. Mild mucosal thickening throughout the paranasal sinuses. Bilateral mastoid effusion, right greater than left. Other: None. MRA HEAD FINDINGS The study is significantly degraded by motion. Anterior circulation: Normal flow related enhancement of the intracranial internal  carotid arteries. Flow related enhancement is seen in the bilateral M1 and A1 segments. Images above the M1 segment are essentially nondiagnostic. Although, there is asymmetry of flow related enhancement, diminished in the right MCA vascular tree compared to the left MCA. Posterior circulation: Flow related enhancement is present in the visualized portions of the intracranial vertebral arteries, basilar artery bilateral P1/PCA segments. Flow related enhancement is seen in the P2 segment with significant artifact at the level of the right P2 and bilateral distal PCA segments. IMPRESSION: 1. Large necrotic appearing lesion within the right frontal lobe within the area of prior right MCA territory infarct with new prominent surrounding vasogenic edema resulting in approximately 8 mm leftward midline shift. Given patient history of endocarditis, findings are concerning for abscess. 2. Significantly degraded MRA of the head due to motion. Asymmetry of flow related enhancement, diminished in the right MCA vascular tree compared to the left MCA. These results were called by telephone at the time of interpretation on 08/23/2023 at 1:37 pm to Dr. Marvel Plan, who verbally acknowledged these results. Electronically Signed   By: Baldemar Lenis M.D.   On: 08/23/2023 13:37   MR ANGIO HEAD WO CONTRAST  Result Date: 08/23/2023 CLINICAL DATA:  Stroke, follow-up. EXAM: MRI HEAD WITHOUT CONTRAST MRA HEAD WITHOUT CONTRAST TECHNIQUE: Multiplanar, multi-echo pulse sequences of the brain and surrounding structures were acquired without intravenous contrast. Angiographic images of the Circle of Willis were acquired using MRA technique without intravenous contrast. COMPARISON:  Head CT August 20, 2023; MRI brain October 17 24. FINDINGS: MRI HEAD FINDINGS The study is significantly degraded by motion. Brain: A large necrotic appearing lesion is seen within the right frontal lobe within the area of prior right MCA  territory infarct measuring approximately 5.3 x 3.7 x 3.7 cm. The lesion shows mixed content with areas of restricted and facilitated diffusion, and new prominent surrounding vasogenic edema which extends into the right basal ganglia,  insular region, right frontal, parietal and temporal regions, resulting in significant mass effect with approximately a 8 mm leftward midline shift, similar to prior CT. Although susceptibility weighted images are severely degraded by motion, low susceptibility artifact is seen in this location to suggest hemorrhagic transformation/intraparenchymal hematoma. There is mild dilatation of left lateral ventricle without evidence of entrapment. Small focus of susceptibility artifact in the left cerebellar hemisphere related to hemorrhagic transformation of late subacute infarct. Vascular: Major arterial flow voids are preserved at the skull base. Skull and upper cervical spine: Diffuse decrease of the T1 signal within the high versus and upper cervical spine, likely related to red marrow in the setting chronic anemia. Sinuses/Orbits: Apparent flattening of the optic discs suggesting increased intracranial pressure. Mild mucosal thickening throughout the paranasal sinuses. Bilateral mastoid effusion, right greater than left. Other: None. MRA HEAD FINDINGS The study is significantly degraded by motion. Anterior circulation: Normal flow related enhancement of the intracranial internal carotid arteries. Flow related enhancement is seen in the bilateral M1 and A1 segments. Images above the M1 segment are essentially nondiagnostic. Although, there is asymmetry of flow related enhancement, diminished in the right MCA vascular tree compared to the left MCA. Posterior circulation: Flow related enhancement is present in the visualized portions of the intracranial vertebral arteries, basilar artery bilateral P1/PCA segments. Flow related enhancement is seen in the P2 segment with significant artifact  at the level of the right P2 and bilateral distal PCA segments. IMPRESSION: 1. Large necrotic appearing lesion within the right frontal lobe within the area of prior right MCA territory infarct with new prominent surrounding vasogenic edema resulting in approximately 8 mm leftward midline shift. Given patient history of endocarditis, findings are concerning for abscess. 2. Significantly degraded MRA of the head due to motion. Asymmetry of flow related enhancement, diminished in the right MCA vascular tree compared to the left MCA. These results were called by telephone at the time of interpretation on 08/23/2023 at 1:37 pm to Dr. Marvel Plan, who verbally acknowledged these results. Electronically Signed   By: Baldemar Lenis M.D.   On: 08/23/2023 13:37    Scheduled Meds:  calcitRIOL  1.5 mcg Oral Q M,W,F   Chlorhexidine Gluconate Cloth  6 each Topical Daily   darbepoetin (ARANESP) injection - DIALYSIS  100 mcg Subcutaneous Q Fri-1800   feeding supplement (NEPRO CARB STEADY)  500 mL Per Tube Q24H   feeding supplement (PROSource TF20)  60 mL Per Tube Daily   insulin aspart  0-15 Units Subcutaneous TID WC   insulin aspart  0-5 Units Subcutaneous QHS   insulin glargine-yfgn  15 Units Subcutaneous Daily   metoprolol tartrate  50 mg Oral BID   midodrine  10 mg Oral Q M,W,F-HD   multivitamin  1 tablet Oral QHS   nutrition supplement (JUVEN)  1 packet Per Tube BID BM   mouth rinse  15 mL Mouth Rinse 4 times per day     LOS: 45 days   Time spent:  Azucena Fallen, DO [facility]  If 7PM-7AM, please contact night-coverage [email or web address]  08/24/2023, 2:25 PM

## 2023-08-24 NOTE — Plan of Care (Signed)
  Problem: Education: Goal: Knowledge of General Education information will improve Description Including pain rating scale, medication(s)/side effects and non-pharmacologic comfort measures Outcome: Progressing   Problem: Health Behavior/Discharge Planning: Goal: Ability to manage health-related needs will improve Outcome: Progressing   

## 2023-08-24 NOTE — Progress Notes (Signed)
PHARMACY - ANTICOAGULATION CONSULT NOTE  Pharmacy Consult for apixaban Indication: DVT  No Known Allergies  Patient Measurements: Height: 5\' 7"  (170.2 cm) Weight: 78 kg (172 lb) (BE) IBW/kg (Calculated) : 66.1  Vital Signs: Temp: 97.9 F (36.6 C) (11/26 1323) Temp Source: Oral (11/26 1323) BP: 156/95 (11/26 1630) Pulse Rate: 95 (11/26 1630)  Labs: Recent Labs    08/21/23 1755 08/23/23 1418  HGB 8.8* 9.7*  HCT 27.9* 31.8*  PLT 186 206  CREATININE  --  6.60*    Estimated Creatinine Clearance: 13.5 mL/min (A) (by C-G formula based on SCr of 6.6 mg/dL (H)).   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, with necrosis of bone ([name])    Dialysis patient ([name])    ESRD (end stage renal disease) ([name])    Hypertension    Sepsis ([name])     Medications:  Scheduled:   apixaban  5 mg Oral BID   calcitRIOL  1.5 mcg Oral Q M,W,F   Chlorhexidine Gluconate Cloth  6 each Topical Daily   darbepoetin (ARANESP) injection - DIALYSIS  100 mcg Subcutaneous Q Fri-1800   feeding supplement (NEPRO CARB STEADY)  500 mL Per Tube Q24H   feeding supplement (PROSource TF20)  60 mL Per Tube Daily   insulin aspart  0-15 Units Subcutaneous TID WC   insulin aspart  0-5 Units Subcutaneous QHS   insulin glargine-yfgn  15 Units Subcutaneous Daily   metoprolol tartrate  50 mg Oral BID   midodrine  10 mg Oral Q M,W,F-HD   multivitamin  1 tablet Oral QHS   nutrition supplement (JUVEN)  1 packet Per Tube BID BM   mouth rinse  15 mL Mouth Rinse 4 times per day   Infusions:   vancomycin 500 mg (08/24/23 1604)   [START ON 08/27/2023] vancomycin     PRN: acetaminophen, albuterol, Gerhardt's butt cream, heparin sodium (porcine), lip balm, loperamide HCl, mouth  rinse  Assessment: 42 y.o. male with history of  ESRD on HD MWF, right diabetic foot ulcer complicated by MRSA bacteremia 7/24 likely 2/2 to HD line infection, insulin dependent diabetes, gastroparesis, hx of left AKA.  On 11/22, patient had worsening mental status and repeat head CT demonstrated worsening right MCA territory infarct with petechial hemorrhage and subarachnoid hemorrhage along with 9 mm midline shift to the right, stroke team was reconsulted. Hemorrhagic transformation likely occurred in setting of Eliquis use for DVT.  Per neurology "Given MRI low suspicious for hemorrhagic conversion, recommend to resume eliquis."  Goal of Therapy:  Monitor platelets by anticoagulation protocol: Yes   Plan: Last dose of apixaban 5 mg on 11/22 @ 09:00. Will resume 5 mg BID starting tonight.  Pharmacy will continue to follow.   Cedric Fishman 08/24/2023,4:40 PM

## 2023-08-24 NOTE — Progress Notes (Signed)
[facility] Progress Note   Subjective: Seen in room. Disheveled, appears somewhat dazed but calls me by name. Says he is getting better slowly. HD later today.    Objective Vitals:   08/23/23 2056 08/23/23 2346 08/24/23 0412 08/24/23 0900  BP: 139/75 (!) 114/57 (!) 121/50 (!) 153/94  Pulse: (!) 105 92 96 100  Resp:  16 18 18   Temp:  97.6 F (36.4 C)    TempSrc:  Axillary    SpO2:  99% 100% 100%  Weight:      Height:       Physical Exam General: Chronically ill appearing male in NAD Heart: S1,S2 RRR No M/R/G Lungs: Decreased in bases otherwise CTAB. No WOB Abdomen: Cortrak feeding tube place.  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 No RLE edema Dialysis Access: L Femoral TDC Drsg intact   Additional Objective Labs: Basic Metabolic Panel: Recent Labs  Lab 08/18/23 1245 08/20/23 1751 08/22/23 1713 08/23/23 1418  NA 125* 131*  --  131*  K 5.6* 5.0  --  4.7  CL 87* 94*  --  91*  CO2 23 23  --  22  GLUCOSE 401* 114* 406* 170*  BUN 98* 94*  --  98*  CREATININE 6.35* 6.70*  --  6.60*  CALCIUM 8.8* 8.9  --  9.3  PHOS  --  4.6  --   --    Liver Function Tests: Recent Labs  Lab 08/20/23 1751  ALBUMIN 2.4*   No results for input(s): "LIPASE", "AMYLASE" in the last 168 hours. CBC: Recent Labs  Lab 08/18/23 1533 08/20/23 0815 08/21/23 1556 08/21/23 1755 08/23/23 1418  WBC 7.8 20.9* 21.8* 21.1* 9.9  NEUTROABS 5.8  --   --   --   --   HGB 9.1* 10.2* 9.9* 8.8* 9.7*  HCT 29.3* 31.9* 31.3* 27.9* 31.8*  MCV 93.0 92.2 92.1 92.7 92.7  PLT 285 226 159 186 206   Blood Culture    Component Value Date/Time   SDES BLOOD LEFT ARM 07/23/2023 2213   SDES BLOOD LEFT ARM 07/23/2023 2213   SPECREQUEST  07/23/2023 2213    BOTTLES DRAWN AEROBIC AND ANAEROBIC Blood Culture adequate volume   SPECREQUEST  07/23/2023 2213    BOTTLES DRAWN AEROBIC AND ANAEROBIC Blood Culture results may not be optimal due to an excessive volume of blood received in culture bottles    CULT  07/23/2023 2213    NO GROWTH 5 DAYS Performed at [facility], [address] [place], [name] [address]    CULT  07/23/2023 2213    NO GROWTH 5 DAYS Performed at [facility], [address], [place], [name] [address]    REPTSTATUS 07/28/2023 FINAL 07/23/2023 2213   REPTSTATUS 07/28/2023 FINAL 07/23/2023 2213    Cardiac Enzymes: No results for input(s): "CKTOTAL", "CKMB", "CKMBINDEX", "TROPONINI" in the last 168 hours. CBG: Recent Labs  Lab 08/23/23 1538 08/23/23 2012 08/23/23 2341 08/24/23 0408 08/24/23 0858  GLUCAP 165* 219* 107* 142* 184*   Iron Studies: No results for input(s): "IRON", "TIBC", "TRANSFERRIN", "FERRITIN" in the last 72 hours. @lablastinr3 @ Studies/Results: MR BRAIN WO CONTRAST  Result Date: 08/23/2023 CLINICAL DATA:  Stroke, follow-up. EXAM: MRI HEAD WITHOUT CONTRAST MRA HEAD WITHOUT CONTRAST TECHNIQUE: Multiplanar, multi-echo pulse sequences of the brain and surrounding structures were acquired without intravenous contrast. Angiographic images of the Circle of Willis were acquired using MRA technique without intravenous contrast. COMPARISON:  Head CT August 20, 2023; MRI brain October 17 24.  FINDINGS: MRI HEAD FINDINGS The study is significantly degraded by motion. Brain: A large necrotic appearing lesion is seen within the right frontal lobe within the area of prior right MCA territory infarct measuring approximately 5.3 x 3.7 x 3.7 cm. The lesion shows mixed content with areas of restricted and facilitated diffusion, and new prominent surrounding vasogenic edema which extends into the right basal ganglia, insular region, right frontal, parietal and temporal regions, resulting in significant mass effect with approximately a 8 mm leftward midline shift, similar to prior CT. Although susceptibility weighted images are severely degraded by motion, low susceptibility artifact is seen in this location to suggest hemorrhagic  transformation/intraparenchymal hematoma. There is mild dilatation of left lateral ventricle without evidence of entrapment. Small focus of susceptibility artifact in the left cerebellar hemisphere related to hemorrhagic transformation of late subacute infarct. Vascular: Major arterial flow voids are preserved at the skull base. Skull and upper cervical spine: Diffuse decrease of the T1 signal within the high versus and upper cervical spine, likely related to red marrow in the setting chronic anemia. Sinuses/Orbits: Apparent flattening of the optic discs suggesting increased intracranial pressure. Mild mucosal thickening throughout the paranasal sinuses. Bilateral mastoid effusion, right greater than left. Other: None. MRA HEAD FINDINGS The study is significantly degraded by motion. Anterior circulation: Normal flow related enhancement of the intracranial internal carotid arteries. Flow related enhancement is seen in the bilateral M1 and A1 segments. Images above the M1 segment are essentially nondiagnostic. Although, there is asymmetry of flow related enhancement, diminished in the right MCA vascular tree compared to the left MCA. Posterior circulation: Flow related enhancement is present in the visualized portions of the intracranial vertebral arteries, basilar artery bilateral P1/PCA segments. Flow related enhancement is seen in the P2 segment with significant artifact at the level of the right P2 and bilateral distal PCA segments. IMPRESSION: 1. Large necrotic appearing lesion within the right frontal lobe within the area of prior right MCA territory infarct with new prominent surrounding vasogenic edema resulting in approximately 8 mm leftward midline shift. Given patient history of endocarditis, findings are concerning for abscess. 2. Significantly degraded MRA of the head due to motion. Asymmetry of flow related enhancement, diminished in the right MCA vascular tree compared to the left MCA. These results  were called by telephone at the time of interpretation on 08/23/2023 at 1:37 pm to Dr. Marvel Plan, who verbally acknowledged these results. Electronically Signed   By: Baldemar Lenis M.D.   On: 08/23/2023 13:37   MR ANGIO HEAD WO CONTRAST  Result Date: 08/23/2023 CLINICAL DATA:  Stroke, follow-up. EXAM: MRI HEAD WITHOUT CONTRAST MRA HEAD WITHOUT CONTRAST TECHNIQUE: Multiplanar, multi-echo pulse sequences of the brain and surrounding structures were acquired without intravenous contrast. Angiographic images of the Circle of Willis were acquired using MRA technique without intravenous contrast. COMPARISON:  Head CT August 20, 2023; MRI brain October 17 24. FINDINGS: MRI HEAD FINDINGS The study is significantly degraded by motion. Brain: A large necrotic appearing lesion is seen within the right frontal lobe within the area of prior right MCA territory infarct measuring approximately 5.3 x 3.7 x 3.7 cm. The lesion shows mixed content with areas of restricted and facilitated diffusion, and new prominent surrounding vasogenic edema which extends into the right basal ganglia, insular region, right frontal, parietal and temporal regions, resulting in significant mass effect with approximately a 8 mm leftward midline shift, similar to prior CT. Although susceptibility weighted images are severely degraded by motion, low  susceptibility artifact is seen in this location to suggest hemorrhagic transformation/intraparenchymal hematoma. There is mild dilatation of left lateral ventricle without evidence of entrapment. Small focus of susceptibility artifact in the left cerebellar hemisphere related to hemorrhagic transformation of late subacute infarct. Vascular: Major arterial flow voids are preserved at the skull base. Skull and upper cervical spine: Diffuse decrease of the T1 signal within the high versus and upper cervical spine, likely related to red marrow in the setting chronic anemia. Sinuses/Orbits:  Apparent flattening of the optic discs suggesting increased intracranial pressure. Mild mucosal thickening throughout the paranasal sinuses. Bilateral mastoid effusion, right greater than left. Other: None. MRA HEAD FINDINGS The study is significantly degraded by motion. Anterior circulation: Normal flow related enhancement of the intracranial internal carotid arteries. Flow related enhancement is seen in the bilateral M1 and A1 segments. Images above the M1 segment are essentially nondiagnostic. Although, there is asymmetry of flow related enhancement, diminished in the right MCA vascular tree compared to the left MCA. Posterior circulation: Flow related enhancement is present in the visualized portions of the intracranial vertebral arteries, basilar artery bilateral P1/PCA segments. Flow related enhancement is seen in the P2 segment with significant artifact at the level of the right P2 and bilateral distal PCA segments. IMPRESSION: 1. Large necrotic appearing lesion within the right frontal lobe within the area of prior right MCA territory infarct with new prominent surrounding vasogenic edema resulting in approximately 8 mm leftward midline shift. Given patient history of endocarditis, findings are concerning for abscess. 2. Significantly degraded MRA of the head due to motion. Asymmetry of flow related enhancement, diminished in the right MCA vascular tree compared to the left MCA. These results were called by telephone at the time of interpretation on 08/23/2023 at 1:37 pm to Dr. Marvel Plan, who verbally acknowledged these results. Electronically Signed   By: Baldemar Lenis M.D.   On: 08/23/2023 13:37   Medications:  vancomycin     [START ON 08/27/2023] vancomycin      calcitRIOL  1.5 mcg Oral Q M,W,F   Chlorhexidine Gluconate Cloth  6 each Topical Daily   darbepoetin (ARANESP) injection - DIALYSIS  100 mcg Subcutaneous Q Fri-1800   feeding supplement (NEPRO CARB STEADY)  500 mL Per Tube  Q24H   feeding supplement (PROSource TF20)  60 mL Per Tube Daily   insulin aspart  0-15 Units Subcutaneous TID WC   insulin aspart  0-5 Units Subcutaneous QHS   insulin glargine-yfgn  15 Units Subcutaneous Daily   metoprolol tartrate  50 mg Oral BID   midodrine  10 mg Oral Q M,W,F-HD   multivitamin  1 tablet Oral QHS   nutrition supplement (JUVEN)  1 packet Per Tube BID BM   mouth rinse  15 mL Mouth Rinse 4 times per day   OP HD: MWF [facility] 4h  400/1.5    77kg   2/3 bath  [facility] TDC     - Heparin none - last OP HD 10/7, post wt 74.6kg - misses HD once every other week approx - rocaltrol 1.50 mcg   Assessment/ Plan:   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 Not a candidate for surgery; CT surgery recommended management w/ antibiotics. SP line holiday w/ old TDC removed 10/16 and new TDC placed 10/18 by IR.  Needs IV vanc for 6 wks thru 09/19/23.  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 Repeat CT  11/22 shows extension of R MCA territory now involving large portion of right frontal and parietal lobes as well as right basal ganglia with petechial hemorrhage and possible small volume subarachnoid hemorrhage. Eliquis is now on hold and neuro seeing patient  ESRD: on HD MWF. SP CRRT 10/22- 10/30, back on iHD now. Had HD Friday and Saturday which was a mistake.  HD 11/26 then Friday given holiday schedulte Volume: Elias-Fela Solis 2L O2 dc'd on 11/10, remains on RA. Euvolemic on exam New dry wt 2-3 kg lower. .  Anemia: Hb 7- 9 range. ESA dose increased to 100 mcg q. weekly. 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 Monitor on renal diet.  On calcitriol for PTH control. Nutrition: cortrak in place, getting tube feeds, not eating much per RN Hypotension: Weaned off of pressors. On midodrine with HD HD access: SP line holiday with new L  femoral TDC placed on 10/18. Pt has occlusion of bilateral subclavian veins limiting his  options for dialysis access. Dispo: he successfully dialyzed in a chair 11/18. Will not need to do this again unless there is some clinical change. Ok for dc to SNF.   Surabhi Gadea H. Jameson Tormey NP-C 08/24/2023, 11:22 AM  [employer] [phone]

## 2023-08-24 NOTE — Progress Notes (Signed)
Physical Therapy Treatment Patient Details Name: [name] MRN: [id number] DOB: [birth year] Today's Date: 08/24/2023   History of Present Illness Patient is a 42 y/o male admitted 07/09/23 with confusion found to be in septic shock found to have MRSA in blood cultures, in DKA, TEE positive for vegitation on multiple valves, MRI showing R MCA infarct.  Pt with resp failure on 10/21 resulting in intubation and transfer to [facility]; extubated 10/24.  CRRT started 10/22-stopped 10/30.  PMH positive for ESRD on HD, IDDM, gastroparesis, L AKA, HTN.    PT Comments  Pt greeted supine, sideways in bed with leg over rail, all 4 rails up. Pt needing total A x2 to come to sitting EOB with pt demonstrating poor sequencing and initiation. Once seated up EOB pt able to maintain intermittently with single UE support on back of chair placed in front of pt with CGA, up to mod A as pt with L lateral lean with fatigue. Pt easily distractible and needing max cues to remain on task and attend to task at hand throughout mobility and demonstrating poor insight into deficits stating "I won't fall" but then unable to self correct LOB laterally to L in sitting. Plan to trial standing in stedy frame next session pending pt sustained attention. Current plan remains appropriate to address deficits and maximize functional independence and decrease caregiver burden. Pt continues to benefit from skilled PT services to progress toward functional mobility goals.     If plan is discharge home, recommend the following: Two people to help with walking and/or transfers;Assist for transportation;Supervision due to cognitive status;Help with stairs or ramp for entrance   Can travel by private vehicle     No  Equipment Recommendations  Hoyer lift;Wheelchair (measurements PT);Wheelchair cushion (measurements PT)    Recommendations for Other Services       Precautions / Restrictions Precautions Precautions: Fall Precaution Comments:  LAKA, L hemiparesis, coretrak, blood clot LUE Restrictions Weight Bearing Restrictions: No     Mobility  Bed Mobility Overal bed mobility: Needs Assistance Bed Mobility: Supine to Sit, Sit to Supine Rolling: Max assist   Supine to sit: +2 for physical assistance, Total assist, HOB elevated Sit to supine: Total assist, +2 for physical assistance   General bed mobility comments: cues for sequencing and hand over hand assist, total A +2 to complete    Transfers                   General transfer comment: deferred    Ambulation/Gait               General Gait Details: unable   Stairs             Wheelchair Mobility     Tilt Bed    Modified Rankin (Stroke Patients Only) Modified Rankin (Stroke Patients Only) Pre-Morbid Rankin Score: Moderate disability Modified Rankin: Severe disability     Balance Overall balance assessment: Needs assistance Sitting-balance support: Single extremity supported, Bilateral upper extremity supported, Feet supported Sitting balance-[name]: Poor Sitting balance - Comments: Able to achieve upright positioning EOB with SBA briefly 50% of the time was mod A, improved with placing RUE on chair in front of P or laterally R and anterior on armrest of reclienr to facilitate R lateral lean Postural control: Left lateral lean     Standing balance comment: NT  Cognition Arousal: Alert Behavior During Therapy: Restless, Agitated Overall Cognitive Status: Impaired/Different from baseline Area of Impairment: Attention, Following commands, Safety/judgement, Awareness                   Current Attention Level: Focused Memory: Decreased short-term memory Following Commands: Follows one step commands inconsistently Safety/Judgement: Decreased awareness of safety, Decreased awareness of deficits Awareness: Intellectual Problem Solving: Slow processing, Decreased initiation,  Difficulty sequencing, Requires verbal cues, Requires tactile cues General Comments: Pt very distracted this session, periods of aggitation but redirectible,        Exercises      General Comments General comments (skin integrity, edema, etc.): VSS, RN in during session      Pertinent Vitals/Pain Pain Assessment Pain Assessment: No/denies pain    Home Living                          Prior Function            PT Goals (current goals can now be found in the care plan section) Acute Rehab PT Goals Patient Stated Goal: none stated PT Goal Formulation: Patient unable to participate in goal setting Time For Goal Achievement: 08/25/23 Progress towards PT goals: Progressing toward goals    Frequency    Min 1X/week      PT Plan      Co-evaluation PT/OT/SLP Co-Evaluation/Treatment: Yes Reason for Co-Treatment: Necessary to address cognition/behavior during functional activity;For patient/therapist safety;To address functional/ADL transfers PT goals addressed during session: Mobility/safety with mobility;Balance OT goals addressed during session: Strengthening/ROM      AM-PAC PT "6 Clicks" Mobility   Outcome Measure  Help needed turning from your back to your side while in a flat bed without using bedrails?: Total Help needed moving from lying on your back to sitting on the side of a flat bed without using bedrails?: Total Help needed moving to and from a bed to a chair (including a wheelchair)?: Total Help needed standing up from a chair using your arms (e.g., wheelchair or bedside chair)?: Total Help needed to walk in hospital room?: Total Help needed climbing 3-5 steps with a railing? : Total 6 Click Score: 6    End of Session   Activity Tolerance: Patient tolerated treatment well Patient left: in bed;with call bell/phone within reach;with bed alarm set Nurse Communication: Mobility status PT Visit Diagnosis: Other abnormalities of gait and mobility  (R26.89);Other symptoms and signs involving the nervous system (R29.898);Hemiplegia and hemiparesis Hemiplegia - Right/Left: Left Hemiplegia - dominant/non-dominant: Non-dominant Hemiplegia - caused by: Cerebral infarction     Time: 1152-1215 PT Time Calculation (min) (ACUTE ONLY): 23 min  Charges:    $Therapeutic Activity: 8-22 mins PT General Charges $$ ACUTE PT VISIT: 1 Visit                     [name] R. PTA Acute Rehabilitation Services Office: [phone]   Catalina Antigua 08/24/2023, 1:05 PM

## 2023-08-24 NOTE — [place] FL2 (Signed)
[place] MEDICAID FL2 LEVEL OF CARE FORM     IDENTIFICATION  Patient Name: [name] Birthdate: 02/10/1981 Sex: male Admission Date (Current Location): 07/09/2023  [facility] and [place] Number:  Producer, television/film/video and Address:  [place], [address], [place], [name] [address]      Provider Number: [id number]  Attending Physician Name and Address:  Azucena Fallen, MD  Relative Name and Phone Number:       Current Level of Care: Hospital Recommended Level of Care: Skilled Nursing Facility Prior Approval Number:    Date Approved/Denied:   PASRR Number: [id number]  Discharge Plan: SNF    Current Diagnoses: Patient Active Problem List   Diagnosis Date Noted   Cerebrovascular accident (CVA) due to embolism of cerebral artery ([name]) 08/02/2023   Endocarditis of tricuspid valve 07/27/2023   Cavitary pneumonia 07/27/2023   Perforation of leaflet of mitral valve 07/27/2023   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) 07/27/2023   Hemiparesis affecting left side as late effect of stroke ([name]) 07/27/2023   Dysphagia due to recent stroke 07/27/2023   Cardiogenic shock ([name]) 07/27/2023   Acute blood loss anemia 07/27/2023   Hx of AKA (above knee amputation), left ([name]) 07/27/2023   Subclavian vein occlusion, bilateral ([name]) 07/27/2023   Fatty liver 07/27/2023   Liver mass 07/27/2023   Physical deconditioning 07/27/2023   Protein-calorie malnutrition, severe 07/22/2023   Infective endocarditis of tricuspid valve 07/15/2023   Acute systolic CHF (congestive heart failure) ([name]) 07/14/2023   DCM (dilated cardiomyopathy) ([name]) 07/14/2023   Coronary artery calcification seen on CAT scan 07/14/2023   Endocarditis of mitral valve 07/13/2023   ESRD on hemodialysis ([name]) 07/13/2023   MRSA bacteremia 07/12/2023   Right elbow pain 07/12/2023   Acute metabolic encephalopathy 07/10/2023   Diabetic foot infection ([name])  04/09/2023   Elevated LFTs 04/09/2023   Thrombocytopenia ([name]) 04/09/2023   Cellulitis of right leg 05/19/2021   Pressure injury of skin 10/15/2020   Adjustment disorder, unspecified 09/21/2020   Malnutrition of moderate degree 09/13/2020   Charcot's joint of foot, left    Pneumonia due to COVID-19 virus 02/14/2020   Allergy, unspecified, initial encounter 07/17/2019   Diarrhea 04/24/2019   Encounter for orthopedic aftercare following surgical amputation    DM (diabetes mellitus), secondary, uncontrolled, with neurologic complications    Headache 09/23/2018   Anemia of chronic disease 08/18/2018   Unspecified protein-calorie malnutrition ([name]) 02/14/2018   Insulin dependent type 1 diabetes mellitus ([name]) 10/25/2017   Diabetic gastroparesis ([name]) 10/25/2017   Septic shock ([name]) 10/25/2017   Iron deficiency anemia, unspecified 10/28/2016   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   Secondary hyperparathyroidism of renal origin ([name]) 10/28/2016   HTN (hypertension) 08/09/2014   ESRD on dialysis ([name]) 03/19/2014    Orientation RESPIRATION BLADDER Height & Weight     Self, Time, Situation, Place  Normal Continent (anuria) Weight: 166 lb (75.3 kg) Height:  5\' 7"  (170.2 cm)  BEHAVIORAL SYMPTOMS/MOOD NEUROLOGICAL BOWEL NUTRITION STATUS      Incontinent Diet, Feeding tube (regular with nectar thick liquids)  AMBULATORY STATUS COMMUNICATION OF NEEDS Skin   Extensive Assist Verbally PU Stage and Appropriate Care   PU Stage 2 Dressing:  (buttocks: foam dressing, lift every shift to assess and change PRN)                   Personal Care Assistance Level of Assistance  Bathing, Feeding, Dressing Bathing Assistance:  Maximum assistance Feeding assistance: Limited assistance Dressing Assistance: Maximum assistance     Functional Limitations Info  Sight Sight Info: Impaired        SPECIAL CARE FACTORS FREQUENCY  PT (By licensed PT), OT (By licensed OT), Speech  therapy     PT Frequency: 5x/wk OT Frequency: 5x/wk     Speech Therapy Frequency: 5x/wk      Contractures Contractures Info: Not present    Additional Factors Info  Code Status, Allergies Code Status Info: Full Allergies Info: NKA           Current Medications (08/24/2023):  This is the current hospital active medication list Current Facility-Administered Medications  Medication Dose Route Frequency Provider Last Rate Last Admin   acetaminophen (TYLENOL) tablet 650 mg  650 mg Oral Q6H PRN Azucena Fallen, MD       albuterol (PROVENTIL) (2.5 MG/3ML) 0.083% nebulizer solution 2.5 mg  2.5 mg Nebulization Q6H PRN Icard, Bradley L, DO       calcitRIOL (ROCALTROL) 1 MCG/ML solution 1.5 mcg  1.5 mcg Oral Q M,W,F Azucena Fallen, MD   1.5 mcg at 08/23/23 1058   Chlorhexidine Gluconate Cloth 2 % PADS 6 each  6 each Topical Daily Azucena Fallen, MD   6 each at 08/23/23 7829   Darbepoetin Alfa (ARANESP) injection 100 mcg  100 mcg Subcutaneous Q Fri-1800 Dagoberto Ligas, MD   100 mcg at 08/13/23 2145   feeding supplement (NEPRO CARB STEADY) liquid 500 mL  500 mL Per Tube Q24H Marvel Plan, MD   500 mL at 08/23/23 1903   feeding supplement (PROSource TF20) liquid 60 mL  60 mL Per Tube Daily Marinda Elk, MD   60 mL at 08/24/23 5621   Gerhardt's butt cream   Topical QID PRN Cheri Fowler, MD   Given at 08/12/23 0909   heparin sodium (porcine) injection 4,600 Units  4,600 Units Intravenous Q dialysis Delano Metz, MD   4,600 Units at 08/18/23 2033   insulin aspart (novoLOG) injection 0-15 Units  0-15 Units Subcutaneous TID WC Azucena Fallen, MD   3 Units at 08/24/23 3086   insulin aspart (novoLOG) injection 0-5 Units  0-5 Units Subcutaneous QHS Azucena Fallen, MD       insulin glargine-yfgn [facility]) injection 15 Units  15 Units Subcutaneous Daily Azucena Fallen, MD   15 Units at 08/24/23 0940   lip balm (CARMEX) ointment   Topical PRN Debarah Crape,  DO   Given at 08/12/23 1527   loperamide HCl (IMODIUM) 1 MG/7.5ML suspension 2 mg  2 mg Oral PRN Azucena Fallen, MD       metoprolol tartrate (LOPRESSOR) tablet 50 mg  50 mg Oral BID Azucena Fallen, MD   50 mg at 08/24/23 0940   midodrine (PROAMATINE) tablet 10 mg  10 mg Oral Q M,W,F-HD Azucena Fallen, MD       multivitamin (RENA-VIT) tablet 1 tablet  1 tablet Oral QHS Azucena Fallen, MD   1 tablet at 08/23/23 2103   nutrition supplement (JUVEN) (JUVEN) powder packet 1 packet  1 packet Per Tube BID BM Marinda Elk, MD   1 packet at 08/24/23 0939   Oral care mouth rinse  15 mL Mouth Rinse 4 times per day Azucena Fallen, MD   15 mL at 08/24/23 0950   Oral care mouth rinse  15 mL Mouth Rinse PRN Azucena Fallen, MD  vancomycin (VANCOREADY) IVPB 500 mg/100 mL  500 mg Intravenous Once Leander Rams, [facility]       [START ON 08/27/2023] vancomycin (VANCOREADY) IVPB 500 mg/100 mL  500 mg Intravenous Q M,W,F-HD Leander Rams, [facility]         Discharge Medications: Please see discharge summary for a list of discharge medications.  Relevant Imaging Results:  Relevant Lab Results:   Additional Information SS#: [id number]; HD MWF at [facility], LCSW

## 2023-08-24 NOTE — Progress Notes (Signed)
Lab reported that at beginning of the shift pt refused 2nd blood culture lab draw. Lab states that the first sample was able to be collected. Pt now agreeable to attempt the 2nd culture now . Lab was unable to get enough of a sample for each bottle. Provider on call [place] notified.

## 2023-08-24 NOTE — TOC Progression Note (Signed)
Transition of Care [facility]) - Progression Note    Patient Details  Name: [name] MRN: [id number] Date of Birth: 1981-01-01  [facility] of [facility]) CM/SW Contact  Baldemar Lenis, [name] Phone Number: 08/24/2023, 11:28 AM  Clinical Narrative:   CSW following for disposition, patient will need SNF when medically ready. Still determining possible need for peg tube. CSW completed referral and faxed out, will follow with bed offers.    Expected Discharge Plan: Skilled Nursing Facility Barriers to Discharge: Continued Medical Work up, English as a second language teacher  Expected Discharge Plan and Services       Living arrangements for the past 2 months: Single Family Home                                       Social Determinants of Health (SDOH) Interventions SDOH Screenings   Food Insecurity: No Food Insecurity (08/03/2023)  Housing: Low Risk  (08/03/2023)  Transportation Needs: No Transportation Needs (08/03/2023)  Utilities: Not At Risk (08/03/2023)  Alcohol Screen: Low Risk  (02/15/2023)  Depression (PHQ2-9): Low Risk  (02/15/2023)  Financial Resource Strain: Low Risk  (02/15/2023)  Physical Activity: Inactive (02/15/2023)  Social Connections: Socially Integrated (02/15/2023)  Stress: No Stress Concern Present (02/15/2023)  Tobacco Use: Low Risk  (08/03/2023)    Readmission Risk Interventions     No data to display

## 2023-08-24 NOTE — Progress Notes (Signed)
Orthopedic Tech Progress Note Patient Details:  [name] 1981-05-02 [id number]  Dropped off sling  Ortho Devices Type of Ortho Device: Shoulder immobilizer Ortho Device/Splint Interventions: Floria Raveling 08/24/2023, 2:56 PM

## 2023-08-24 NOTE — Progress Notes (Signed)
[facility]  Date of Admission:  07/09/2023   Principal Problem:   Endocarditis of mitral valve Active Problems:   Insulin dependent type 1 diabetes mellitus ([name])   Diabetic gastroparesis ([name])   Septic shock ([name])   Anemia of chronic disease   Iron deficiency anemia, unspecified   Secondary hyperparathyroidism of renal origin ([name])   Thrombocytopenia ([name])   Acute metabolic encephalopathy   MRSA bacteremia   Right elbow pain   ESRD on hemodialysis ([name])   Acute systolic CHF (congestive heart failure) ([name])   DCM (dilated cardiomyopathy) ([name])   Coronary artery calcification seen on CAT scan   Infective endocarditis of tricuspid valve   Protein-calorie malnutrition, severe   Cavitary pneumonia   Perforation of leaflet of mitral valve   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)   Hemiparesis affecting left side as late effect of stroke ([name])   Dysphagia due to recent stroke   Cardiogenic shock ([name])   Acute blood loss anemia   Hx of AKA (above knee amputation), left ([name])   Subclavian vein occlusion, bilateral ([name])   Fatty liver   Liver mass   Physical deconditioning   Cerebrovascular accident (CVA) due to embolism of cerebral artery ([name])          Assessment: [name]  with DM, left AKA intubated with: # Disseminated MRSA infection with MRSA bacteremia with native mitral valve/possible TV endocarditis, right MCA stroke and abscess, LUL cavitation - TEE on 10/22 showed a large vegetation measuring over 3.9 cm X 1 cm on mitral leaflet, aortic valve -Patient has been on IV vancomycin for above. - Patient developed worsening mental status on 11/22 CT demonstrated worsening right MCA territory infarct and petechial hemorrhage and subarachnoid hemorrhage.  Neurology recommended MRI brain which showed large necrotic urine lesion in the right frontal lobe with an area of right MCA infarct with new vasogenic edema given  endocarditis history concerning for abscess. - Neurosurgery was engaged and commented that they did not see a focal abscess for which recommend any type of surgical intervention.  Recommended infectious disease involvement. Recommendations: - Continue vancomycin with HD. -- Add linezolid for CNS coverage x 2 weeks. Repeat MRA brain in a couple weeks.of note given that MRA is concerning for an abscess, antibiotics alone unlikely to be curative.  Given that patient had a very large vegetation on mitral valve this could be septic emboli as well. -Repeat blood cultures - Of note patient has a femoral line x 39 days   Microbiology:   Antibiotics: Vancomycin   SUBJECTIVE: Resting in bed.  Denies headaches.  Lower to [name] Interval: Afebrile overnight.  WBC 9.9K  Review of Systems: Review of Systems  All other systems reviewed and are negative.    Scheduled Meds:  calcitRIOL  1.5 mcg Oral Q M,W,F   Chlorhexidine Gluconate Cloth  6 each Topical Daily   darbepoetin (ARANESP) injection - DIALYSIS  100 mcg Subcutaneous Q Fri-1800   feeding supplement (NEPRO CARB STEADY)  500 mL Per Tube Q24H   feeding supplement (PROSource TF20)  60 mL Per Tube Daily   insulin aspart  0-15 Units Subcutaneous TID WC   insulin aspart  0-5 Units Subcutaneous QHS   insulin glargine-yfgn  15 Units Subcutaneous Daily   metoprolol tartrate  50 mg Oral BID   midodrine  10 mg Oral Q M,W,F-HD   multivitamin  1 tablet Oral QHS  nutrition supplement (JUVEN)  1 packet Per Tube BID BM   mouth rinse  15 mL Mouth Rinse 4 times per day   Continuous Infusions:  vancomycin     [START ON 08/27/2023] vancomycin     PRN Meds:.acetaminophen, albuterol, Gerhardt's butt cream, heparin sodium (porcine), lip balm, loperamide HCl, mouth rinse No Known Allergies  OBJECTIVE: Vitals:   08/23/23 2056 08/23/23 2346 08/24/23 0412 08/24/23 0900  BP: 139/75 (!) 114/57 (!) 121/50 (!) 153/94  Pulse: (!) 105 92 96 100  Resp:   16 18 18   Temp:  97.6 F (36.4 C)    TempSrc:  Axillary    SpO2:  99% 100% 100%  Weight:      Height:       Body mass index is 26 kg/m.  Physical Exam Constitutional:      General: He is not in acute distress.    Appearance: He is normal weight. He is not toxic-appearing.  HENT:     Head: Normocephalic and atraumatic.     Right Ear: External ear normal.     Left Ear: External ear normal.     Nose: No congestion or rhinorrhea.     Mouth/Throat:     Mouth: Mucous membranes are moist.     Pharynx: Oropharynx is clear.  Eyes:     Extraocular Movements: Extraocular movements intact.     Conjunctiva/sclera: Conjunctivae normal.     Pupils: Pupils are equal, round, and reactive to light.  Cardiovascular:     Rate and Rhythm: Normal rate and regular rhythm.     Heart sounds: No murmur heard.    No friction rub. No gallop.  Pulmonary:     Effort: Pulmonary effort is normal.     Breath sounds: Normal breath sounds.  Abdominal:     General: Abdomen is flat. Bowel sounds are normal.     Palpations: Abdomen is soft.  Musculoskeletal:        General: No swelling. Normal range of motion.     Cervical back: Normal range of motion and neck supple.  Skin:    General: Skin is warm and dry.  Neurological:     General: No focal deficit present.     Mental Status: He is oriented to person, place, and time.  Psychiatric:        Mood and Affect: Mood normal.       Lab Results Lab Results  Component Value Date   WBC 9.9 08/23/2023   HGB 9.7 (L) 08/23/2023   HCT 31.8 (L) 08/23/2023   MCV 92.7 08/23/2023   PLT 206 08/23/2023    Lab Results  Component Value Date   CREATININE 6.60 (H) 08/23/2023   BUN 98 (H) 08/23/2023   NA 131 (L) 08/23/2023   K 4.7 08/23/2023   CL 91 (L) 08/23/2023   CO2 22 08/23/2023    Lab Results  Component Value Date   ALT 10 08/17/2023   AST 37 08/17/2023   ALKPHOS 136 (H) 08/17/2023   BILITOT 0.9 08/17/2023        Danelle Earthly,  MD [facility] 08/24/2023, 1:15 PM I have personally spent 52 minutes involved in face-to-face and non-face-to-face activities for this patient on the day of the visit. Professional time spent includes the following activities: Preparing to see the patient (review of tests), Obtaining and/or reviewing separately obtained history (admission/discharge record), Performing a medically appropriate examination and/or evaluation , Ordering medications/tests/procedures, referring and communicating  with other health care professionals, Documenting clinical information in the EMR, Independently interpreting results (not separately reported), Communicating results to the patient/family/caregiver, Counseling and educating the patient/family/caregiver and Care coordination (not separately reported).

## 2023-08-25 DIAGNOSIS — I059 Rheumatic mitral valve disease, unspecified: Secondary | ICD-10-CM | POA: Diagnosis not present

## 2023-08-25 LAB — RENAL FUNCTION PANEL
Albumin: 2.8 g/dL — ABNORMAL LOW (ref 3.5–5.0)
Anion gap: 14 (ref 5–15)
BUN: 81 mg/dL — ABNORMAL HIGH (ref 6–20)
CO2: 23 mmol/L (ref 22–32)
Calcium: 9.4 mg/dL (ref 8.9–10.3)
Chloride: 89 mmol/L — ABNORMAL LOW (ref 98–111)
Creatinine, Ser: 6.21 mg/dL — ABNORMAL HIGH (ref 0.61–1.24)
GFR, Estimated: 11 mL/min — ABNORMAL LOW (ref >60)
Glucose, Bld: 290 mg/dL — ABNORMAL HIGH (ref 70–99)
Phosphorus: 4.9 mg/dL — ABNORMAL HIGH (ref 2.5–4.6)
Potassium: 5.1 mmol/L (ref 3.5–5.1)
Sodium: 126 mmol/L — ABNORMAL LOW (ref 135–145)

## 2023-08-25 LAB — CBC
HCT: 31.2 % — ABNORMAL LOW (ref 39.0–52.0)
Hemoglobin: 9.8 g/dL — ABNORMAL LOW (ref 13.0–17.0)
MCH: 28.7 pg (ref 26.0–34.0)
MCHC: 31.4 g/dL (ref 30.0–36.0)
MCV: 91.2 fL (ref 80.0–100.0)
Platelets: 176 10*3/uL (ref 150–400)
RBC: 3.42 MIL/uL — ABNORMAL LOW (ref 4.22–5.81)
RDW: 15.8 % — ABNORMAL HIGH (ref 11.5–15.5)
WBC: 8.8 10*3/uL (ref 4.0–10.5)
nRBC: 0 % (ref 0.0–0.2)

## 2023-08-25 LAB — GLUCOSE, CAPILLARY
Glucose-Capillary: 112 mg/dL — ABNORMAL HIGH (ref 70–99)
Glucose-Capillary: 227 mg/dL — ABNORMAL HIGH (ref 70–99)
Glucose-Capillary: 265 mg/dL — ABNORMAL HIGH (ref 70–99)
Glucose-Capillary: 200 mg/dL — ABNORMAL HIGH (ref 70–99)

## 2023-08-25 MED ORDER — LINEZOLID 600 MG PO TABS
600.0000 mg | ORAL_TABLET | Freq: Two times a day (BID) | ORAL | Status: DC
  Administered 2023-08-25 – 2023-08-27 (×4): 600 mg via ORAL
  Filled 2023-08-25 (×6): qty 1

## 2023-08-25 MED ORDER — INSULIN GLARGINE-YFGN 100 UNIT/ML ~~LOC~~ SOLN
10.0000 [IU] | Freq: Every day | SUBCUTANEOUS | Status: DC
  Administered 2023-08-26: 10 [IU] via SUBCUTANEOUS
  Filled 2023-08-25: qty 0.1

## 2023-08-25 NOTE — Progress Notes (Signed)
Speech Language Pathology Treatment: Dysphagia  Patient Details Name: [name] MRN: [id number] DOB: [birth year] Today's Date: 08/25/2023 Time: 5188-4166 SLP Time Calculation (min) (ACUTE ONLY): 17 min  Assessment / Plan / Recommendation Clinical Impression  [name] presented today with slightly increased confusion - he tended to respond to questions with "okay" despite request and could not repeat the question when asked.  He was repositioned in bed; accepted a few bites of solid food and only two sips of thickened liquid before politely declining further POs.  Needed max verbal cues for attention. Overall, he is tolerating diet well - chewing thoroughly, swallowing with no pocketing in left cheek, no coughing. His intake remains limited despite advancing diet to regular solids.  Today his cortrak appeared to have been chewed upon - D/W RN, who states it is still functional.   It appears that PEG remains best option to provide needed nutrition.  His swallowing is functional and allows for safe eating. SLP will continue to follow.   HPI HPI: Pt is a 42 yo male presenting 10/11 with AMS, DKA. Admitted for septic shock from MRSA bacteremia; infective endocarditis. LUE weakness was noted 10/17 and MRI confirmed sizable infarct in the R MCA branch as well as small acute infarcts in the L occipital lobe and bilateral cerebellum. Pt initially evaluated by SLP 10/18 and recommended to have Dys 3 diet and thin liquids due primarily oral symptoms. Pt had worsening hypoxia and AMS 10/21 requiring intubation 10/21-10/24. 10/22-10/30 CRRT. 11/08 MBS, dysphagia 1/nectar. 11/22 neuro changes with new head CT showing extension of the right MCA territory infarct involving large portion of R frontal and parietal lobes, as well as R BG. Now there is concern for brain abscess. ID following. PMH includes: ESRD, DM, gastroparesis, L AKA, HTN, recent admission in July for sepsis secondary to R diabetic foot ulcer  (left AMA)      SLP Plan  Continue with current plan of care      Recommendations for follow up therapy are one component of a multi-disciplinary discharge planning process, led by the attending physician.  Recommendations may be updated based on patient status, additional functional criteria and insurance authorization.    Recommendations  Diet recommendations: Regular;Nectar-thick liquid Liquids provided via: Cup;Straw Medication Administration: Whole meds with puree Supervision: Staff to assist with self feeding;Intermittent supervision to cue for compensatory strategies Compensations: Minimize environmental distractions Postural Changes and/or Swallow Maneuvers: Seated upright 90 degrees                  Oral care BID   Frequent or constant Supervision/Assistance Dysphagia, oropharyngeal phase (R13.12)     Continue with current plan of care   Ciearra Rufo L. Samson Frederic, MA CCC/SLP Clinical Specialist - Acute Care SLP Acute Rehabilitation Services Office number [phone]   [name]  08/25/2023, 9:48 AM

## 2023-08-25 NOTE — Progress Notes (Signed)
PROGRESS NOTE    [name]  ZOX:[id number] DOB: 01/05/1981 DOA: 07/09/2023 PCP: [name], NP    Brief Narrative:  42 year old gentleman with history of ESRD on hemodialysis, insulin-dependent type 2 diabetes, gastroparesis admitted since 10/11 where he presented with confusion, encephalopathic was agitation blood glucose 500.  Complicated hospital stay.  Sepsis.  MRSA bacteremia.  ICU admission.  Significant hospital events as below.  10/11, admitted with altered mental status and DKA.  Blood cultures positive for MRSA.  TEE with ejection fraction 30 to 35%, mitral and tricuspid valve endocarditis.  Seen by cardiothoracic surgery, felt to be poor candidate.  ID recommended 6 weeks of vancomycin. 10/17, found to have acute right MCA stroke, left occipital and bilateral cerebellum consistent with septic emboli.  Neuroconsulted. 10/18, tunneled left femoral vascular catheter placed after line holiday.  Patient developed worsening lethargy and confusion, intubated overnight and transferred to ICU. 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  On increasing vasopressor requirement.  Extubated on 10/24. Failed swallow evaluation, multiple blood transfusions, hypoglycemic events.  On CRRT in ICU. 11/21-22, poor oral intake.  Repeat stroke workup for altered mental status showed worsening MCA territory infarct with petechial hemorrhage. 11/25, MRI questionable abscess or cerebritis.  Neurosurgery does not recommend any intervention.  ID recommended to give with Zyvox for 2 weeks on top of vancomycin that he is already receiving.  Remains in overall  poor clinical status but, clinically stabilizing now.  Oral intake remains challenging. Previous hospitalist tried multiple times to transfer patient to [place], [facility] or [facility] declined transfer.  CT surgery does not recommend any valve surgery.  Subjective: Patient seen and examined.  "Can you  let me go home, promise you let me go home tomorrow".  Nursing reported he ate most of his breakfast.  No other overnight events.  Slightly impulsive.  I would eat he food however "the hospital food is like shit" Nursing reported no other issues been his usual this satisfactions.  Remains afebrile.  On room air.    Assessment & Plan:   Acute right MCA, expanding with new midline shift 08/20/2023. Acute metabolic encephalopathy with delirium and agitation, resolving. Currently mental status is improving.  He is off all the sedation and narcotics.  Suspected abscess or cerebritis.  Antibiotics recommendation as below.  Dysphagia, severe protein calorie malnutrition: Multiple episodes of encephalopathy and poor oral intake. Currently consuming more than 50% of meals, continue regular diet with nectar thick liquid, encourage oral intake.  He has a core track, hold tube feeding today to monitor his oral intake.  Hopefully he can go back on regular diet soon. Family decided to monitor over next few days before deciding for PEG tube feeding.  Septic shock, mitral and tricuspid valve endocarditis, MRSA bacteremia: Resolved.  On midodrine.  Not a surgical candidate. Vancomycin with hemodialysis until 09/19/2023 Linezolid for 2 weeks starting 11/26  Acute on chronic combined systolic and diastolic heart failure: Now on dialysis.  Continue metoprolol.  Euvolemic.  Lower extremity DVT: Therapeutic on Eliquis  ESRD on hemodialysis: On his schedule of dialysis.  He had line holiday and then a tunneled catheter was placed on the left femoral area on 10/18.  Receiving dialysis. Anemia thrombocytopenia due to critical illness: Total 5 units of PRBC.  Pressure Injury 08/03/23 Buttocks Left Stage 2 -  Partial thickness loss of dermis presenting as a shallow open injury with a red, pink wound bed without slough. Several small open  areas, right buttocks area (Active)  08/03/23 1945  Location: Buttocks   Location Orientation: Left  Staging: Stage 2 -  Partial thickness loss of dermis presenting as a shallow open injury with a red, pink wound bed without slough.  Wound Description (Comments): Several small open areas, right buttocks area  Present on Admission: No (present on transfer to [facility])         DVT prophylaxis: Place and maintain sequential compression device Start: 07/30/23 1415 apixaban (ELIQUIS) tablet 5 mg   Code Status: Full code Family Communication: None at the bedside Disposition Plan: Status is: Inpatient Remains inpatient appropriate because: IV antibiotics, safe disposition plan pending     Consultants:  Neurology Nephrology Palliative care CT surgery  Procedures:  Multiple procedures as above  Antimicrobials:  Vancomycin and linezolid     Objective: Vitals:   08/24/23 2018 08/24/23 2347 08/25/23 0509 08/25/23 0808  BP: (!) 146/101 (!) 157/99 121/73 (!) 154/104  Pulse: 92 87 (!) 106 (!) 108  Resp: 17  18   Temp: 98.2 F (36.8 C) 98.1 F (36.7 C) 98.7 F (37.1 C) 98.5 F (36.9 C)  TempSrc: Oral Oral Oral Oral  SpO2: 100% 100% 100% 100%  Weight:      Height:        Intake/Output Summary (Last 24 hours) at 08/25/2023 1027 Last data filed at 08/24/2023 1717 Gross per 24 hour  Intake --  Output 2000 ml  Net -2000 ml   Filed Weights   08/21/23 0500 08/24/23 1328 08/24/23 1715  Weight: 75.3 kg 78 kg 78 kg    Examination:  General exam: Appears calm and slightly impulsive. On room air.  Talkative. Respiratory system: No added sounds. Cardiovascular system: S1 & S2 heard, RRR.  Gastrointestinal system: Soft.  Nontender. Central nervous system: Alert and awake.  He is oriented to himself and his family.  Slightly impulsive.   Left below-knee amputation. Chronic left hemiplegia.     Data Reviewed: I have personally reviewed following labs and imaging studies  CBC: Recent Labs  Lab 08/18/23 1533 08/20/23 0815 08/21/23 1556  08/21/23 1755 08/23/23 1418  WBC 7.8 20.9* 21.8* 21.1* 9.9  NEUTROABS 5.8  --   --   --   --   HGB 9.1* 10.2* 9.9* 8.8* 9.7*  HCT 29.3* 31.9* 31.3* 27.9* 31.8*  MCV 93.0 92.2 92.1 92.7 92.7  PLT 285 226 159 186 206   Basic Metabolic Panel: Recent Labs  Lab 08/18/23 1245 08/19/23 0237 08/20/23 0222 08/20/23 1751 08/21/23 1556 08/22/23 0426 08/22/23 1713 08/23/23 1418 08/24/23 0430  NA 125*  --   --  131*  --   --   --  131*  --   K 5.6*  --   --  5.0  --   --   --  4.7  --   CL 87*  --   --  94*  --   --   --  91*  --   CO2 23  --   --  23  --   --   --  22  --   GLUCOSE 401*  --   --  114*  --   --  406* 170*  --   BUN 98*  --   --  94*  --   --   --  98*  --   CREATININE 6.35*  --   --  6.70*  --   --   --  6.60*  --  CALCIUM 8.8*  --   --  8.9  --   --   --  9.3  --   MG  --  2.5* 2.6*  --  2.8* 2.6*  --   --  3.1*  PHOS  --   --   --  4.6  --   --   --   --   --    GFR: Estimated Creatinine Clearance: 13.5 mL/min (A) (by C-G formula based on SCr of 6.6 mg/dL (H)). Liver Function Tests: Recent Labs  Lab 08/20/23 1751  ALBUMIN 2.4*   No results for input(s): "LIPASE", "AMYLASE" in the last 168 hours. No results for input(s): "AMMONIA" in the last 168 hours. Coagulation Profile: No results for input(s): "INR", "PROTIME" in the last 168 hours. Cardiac Enzymes: No results for input(s): "CKTOTAL", "CKMB", "CKMBINDEX", "TROPONINI" in the last 168 hours. BNP (last 3 results) No results for input(s): "PROBNP" in the last 8760 hours. HbA1C: No results for input(s): "HGBA1C" in the last 72 hours. CBG: Recent Labs  Lab 08/24/23 0858 08/24/23 1206 08/24/23 1806 08/24/23 2056 08/25/23 0629  GLUCAP 184* 184* 151* 178* 112*   Lipid Profile: No results for input(s): "CHOL", "HDL", "LDLCALC", "TRIG", "CHOLHDL", "LDLDIRECT" in the last 72 hours. Thyroid Function Tests: No results for input(s): "TSH", "T4TOTAL", "FREET4", "T3FREE", "THYROIDAB" in the last 72  hours. Anemia Panel: No results for input(s): "VITAMINB12", "FOLATE", "FERRITIN", "TIBC", "IRON", "RETICCTPCT" in the last 72 hours. Sepsis Labs: No results for input(s): "PROCALCITON", "LATICACIDVEN" in the last 168 hours.  Recent Results (from the past 240 hour(s))  Culture, blood (Routine X 2) w Reflex to ID Panel     Status: None (Preliminary result)   Collection Time: 08/24/23  6:52 PM   Specimen: BLOOD  Result Value Ref Range Status   Specimen Description BLOOD SITE NOT SPECIFIED  Final   Special Requests   Final    BOTTLES DRAWN AEROBIC AND ANAEROBIC Blood Culture results may not be optimal due to an inadequate volume of blood received in culture bottles   Culture   Final    NO GROWTH < 12 HOURS Performed at [facility], [address] [place], [name] [address]    Report Status PENDING  Incomplete         Radiology Studies: MR BRAIN WO CONTRAST  Result Date: 08/23/2023 CLINICAL DATA:  Stroke, follow-up. EXAM: MRI HEAD WITHOUT CONTRAST MRA HEAD WITHOUT CONTRAST TECHNIQUE: Multiplanar, multi-echo pulse sequences of the brain and surrounding structures were acquired without intravenous contrast. Angiographic images of the Circle of Willis were acquired using MRA technique without intravenous contrast. COMPARISON:  Head CT August 20, 2023; MRI brain October 17 24. FINDINGS: MRI HEAD FINDINGS The study is significantly degraded by motion. Brain: A large necrotic appearing lesion is seen within the right frontal lobe within the area of prior right MCA territory infarct measuring approximately 5.3 x 3.7 x 3.7 cm. The lesion shows mixed content with areas of restricted and facilitated diffusion, and new prominent surrounding vasogenic edema which extends into the right basal ganglia, insular region, right frontal, parietal and temporal regions, resulting in significant mass effect with approximately a 8 mm leftward midline shift, similar to prior CT. Although susceptibility  weighted images are severely degraded by motion, low susceptibility artifact is seen in this location to suggest hemorrhagic transformation/intraparenchymal hematoma. There is mild dilatation of left lateral ventricle without evidence of entrapment. Small focus of susceptibility artifact in the left cerebellar hemisphere related to  hemorrhagic transformation of late subacute infarct. Vascular: Major arterial flow voids are preserved at the skull base. Skull and upper cervical spine: Diffuse decrease of the T1 signal within the high versus and upper cervical spine, likely related to red marrow in the setting chronic anemia. Sinuses/Orbits: Apparent flattening of the optic discs suggesting increased intracranial pressure. Mild mucosal thickening throughout the paranasal sinuses. Bilateral mastoid effusion, right greater than left. Other: None. MRA HEAD FINDINGS The study is significantly degraded by motion. Anterior circulation: Normal flow related enhancement of the intracranial internal carotid arteries. Flow related enhancement is seen in the bilateral M1 and A1 segments. Images above the M1 segment are essentially nondiagnostic. Although, there is asymmetry of flow related enhancement, diminished in the right MCA vascular tree compared to the left MCA. Posterior circulation: Flow related enhancement is present in the visualized portions of the intracranial vertebral arteries, basilar artery bilateral P1/PCA segments. Flow related enhancement is seen in the P2 segment with significant artifact at the level of the right P2 and bilateral distal PCA segments. IMPRESSION: 1. Large necrotic appearing lesion within the right frontal lobe within the area of prior right MCA territory infarct with new prominent surrounding vasogenic edema resulting in approximately 8 mm leftward midline shift. Given patient history of endocarditis, findings are concerning for abscess. 2. Significantly degraded MRA of the head due to motion.  Asymmetry of flow related enhancement, diminished in the right MCA vascular tree compared to the left MCA. These results were called by telephone at the time of interpretation on 08/23/2023 at 1:37 pm to Dr. Marvel Plan, who verbally acknowledged these results. Electronically Signed   By: Baldemar Lenis M.D.   On: 08/23/2023 13:37   MR ANGIO HEAD WO CONTRAST  Result Date: 08/23/2023 CLINICAL DATA:  Stroke, follow-up. EXAM: MRI HEAD WITHOUT CONTRAST MRA HEAD WITHOUT CONTRAST TECHNIQUE: Multiplanar, multi-echo pulse sequences of the brain and surrounding structures were acquired without intravenous contrast. Angiographic images of the Circle of Willis were acquired using MRA technique without intravenous contrast. COMPARISON:  Head CT August 20, 2023; MRI brain October 17 24. FINDINGS: MRI HEAD FINDINGS The study is significantly degraded by motion. Brain: A large necrotic appearing lesion is seen within the right frontal lobe within the area of prior right MCA territory infarct measuring approximately 5.3 x 3.7 x 3.7 cm. The lesion shows mixed content with areas of restricted and facilitated diffusion, and new prominent surrounding vasogenic edema which extends into the right basal ganglia, insular region, right frontal, parietal and temporal regions, resulting in significant mass effect with approximately a 8 mm leftward midline shift, similar to prior CT. Although susceptibility weighted images are severely degraded by motion, low susceptibility artifact is seen in this location to suggest hemorrhagic transformation/intraparenchymal hematoma. There is mild dilatation of left lateral ventricle without evidence of entrapment. Small focus of susceptibility artifact in the left cerebellar hemisphere related to hemorrhagic transformation of late subacute infarct. Vascular: Major arterial flow voids are preserved at the skull base. Skull and upper cervical spine: Diffuse decrease of the T1 signal  within the high versus and upper cervical spine, likely related to red marrow in the setting chronic anemia. Sinuses/Orbits: Apparent flattening of the optic discs suggesting increased intracranial pressure. Mild mucosal thickening throughout the paranasal sinuses. Bilateral mastoid effusion, right greater than left. Other: None. MRA HEAD FINDINGS The study is significantly degraded by motion. Anterior circulation: Normal flow related enhancement of the intracranial internal carotid arteries. Flow related enhancement is seen in  the bilateral M1 and A1 segments. Images above the M1 segment are essentially nondiagnostic. Although, there is asymmetry of flow related enhancement, diminished in the right MCA vascular tree compared to the left MCA. Posterior circulation: Flow related enhancement is present in the visualized portions of the intracranial vertebral arteries, basilar artery bilateral P1/PCA segments. Flow related enhancement is seen in the P2 segment with significant artifact at the level of the right P2 and bilateral distal PCA segments. IMPRESSION: 1. Large necrotic appearing lesion within the right frontal lobe within the area of prior right MCA territory infarct with new prominent surrounding vasogenic edema resulting in approximately 8 mm leftward midline shift. Given patient history of endocarditis, findings are concerning for abscess. 2. Significantly degraded MRA of the head due to motion. Asymmetry of flow related enhancement, diminished in the right MCA vascular tree compared to the left MCA. These results were called by telephone at the time of interpretation on 08/23/2023 at 1:37 pm to Dr. Marvel Plan, who verbally acknowledged these results. Electronically Signed   By: Baldemar Lenis M.D.   On: 08/23/2023 13:37        Scheduled Meds:  apixaban  5 mg Oral BID   calcitRIOL  1.5 mcg Oral Q M,W,F   Chlorhexidine Gluconate Cloth  6 each Topical Daily   darbepoetin (ARANESP)  injection - DIALYSIS  100 mcg Subcutaneous Q Fri-1800   feeding supplement (PROSource TF20)  60 mL Per Tube Daily   insulin aspart  0-15 Units Subcutaneous TID WC   insulin aspart  0-5 Units Subcutaneous QHS   [START ON 08/26/2023] insulin glargine-yfgn  10 Units Subcutaneous Daily   linezolid  600 mg Oral Q12H   metoprolol tartrate  50 mg Oral BID   midodrine  10 mg Oral Q M,W,F-HD   multivitamin  1 tablet Oral QHS   nutrition supplement (JUVEN)  1 packet Per Tube BID BM   mouth rinse  15 mL Mouth Rinse 4 times per day   Continuous Infusions:  [START ON 08/27/2023] vancomycin       LOS: 46 days    Time spent: 40 minutes    Dorcas Carrow, MD [facility]

## 2023-08-25 NOTE — Progress Notes (Signed)
Pt receives out-pt HD at [facility]) on MWF. Pt arrives at 11:40 am for 12:00 chair time. Will assist as needed.   Olivia Canter Renal Navigator [phone]

## 2023-08-25 NOTE — Progress Notes (Addendum)
[facility] Progress Note   Subjective: HD 08/24/2023 Net UF 2 liters. No issues reported overnight.   Objective Vitals:   08/24/23 2018 08/24/23 2347 08/25/23 0509 08/25/23 0808  BP: (!) 146/101 (!) 157/99 121/73 (!) 154/104  Pulse: 92 87 (!) 106 (!) 108  Resp: 17  18   Temp: 98.2 F (36.8 C) 98.1 F (36.7 C) 98.7 F (37.1 C) 98.5 F (36.9 C)  TempSrc: Oral Oral Oral Oral  SpO2: 100% 100% 100% 100%  Weight:      Height:       Physical Exam General: Chronically ill appearing male in NAD Heart: S1,S2 RRR No M/R/G Lungs: Decreased in bases otherwise CTAB. No WOB Abdomen: Cortrak feeding tube place.  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 No RLE edema Dialysis Access: L Femoral TDC Drsg intact    Additional Objective Labs: Basic Metabolic Panel: Recent Labs  Lab 08/18/23 1245 08/20/23 1751 08/22/23 1713 08/23/23 1418  NA 125* 131*  --  131*  K 5.6* 5.0  --  4.7  CL 87* 94*  --  91*  CO2 23 23  --  22  GLUCOSE 401* 114* 406* 170*  BUN 98* 94*  --  98*  CREATININE 6.35* 6.70*  --  6.60*  CALCIUM 8.8* 8.9  --  9.3  PHOS  --  4.6  --   --    Liver Function Tests: Recent Labs  Lab 08/20/23 1751  ALBUMIN 2.4*   No results for input(s): "LIPASE", "AMYLASE" in the last 168 hours. CBC: Recent Labs  Lab 08/18/23 1533 08/20/23 0815 08/21/23 1556 08/21/23 1755 08/23/23 1418  WBC 7.8 20.9* 21.8* 21.1* 9.9  NEUTROABS 5.8  --   --   --   --   HGB 9.1* 10.2* 9.9* 8.8* 9.7*  HCT 29.3* 31.9* 31.3* 27.9* 31.8*  MCV 93.0 92.2 92.1 92.7 92.7  PLT 285 226 159 186 206   Blood Culture    Component Value Date/Time   SDES BLOOD SITE NOT SPECIFIED 08/24/2023 1852   SPECREQUEST  08/24/2023 1852    BOTTLES DRAWN AEROBIC AND ANAEROBIC Blood Culture results may not be optimal due to an inadequate volume of blood received in culture bottles   CULT  08/24/2023 1852    NO GROWTH < 12 HOURS Performed at [facility], [address], [place], [name]  [address]    REPTSTATUS PENDING 08/24/2023 6045    Cardiac Enzymes: No results for input(s): "CKTOTAL", "CKMB", "CKMBINDEX", "TROPONINI" in the last 168 hours. CBG: Recent Labs  Lab 08/24/23 0858 08/24/23 1206 08/24/23 1806 08/24/23 2056 08/25/23 0629  GLUCAP 184* 184* 151* 178* 112*   Iron Studies: No results for input(s): "IRON", "TIBC", "TRANSFERRIN", "FERRITIN" in the last 72 hours. @lablastinr3 @ Studies/Results: MR BRAIN WO CONTRAST  Result Date: 08/23/2023 CLINICAL DATA:  Stroke, follow-up. EXAM: MRI HEAD WITHOUT CONTRAST MRA HEAD WITHOUT CONTRAST TECHNIQUE: Multiplanar, multi-echo pulse sequences of the brain and surrounding structures were acquired without intravenous contrast. Angiographic images of the Circle of Willis were acquired using MRA technique without intravenous contrast. COMPARISON:  Head CT August 20, 2023; MRI brain October 17 24. FINDINGS: MRI HEAD FINDINGS The study is significantly degraded by motion. Brain: A large necrotic appearing lesion is seen within the right frontal lobe within the area of prior right MCA territory infarct measuring approximately 5.3 x 3.7 x 3.7 cm. The lesion shows mixed content with areas of restricted and facilitated diffusion, and new prominent surrounding vasogenic edema which  extends into the right basal ganglia, insular region, right frontal, parietal and temporal regions, resulting in significant mass effect with approximately a 8 mm leftward midline shift, similar to prior CT. Although susceptibility weighted images are severely degraded by motion, low susceptibility artifact is seen in this location to suggest hemorrhagic transformation/intraparenchymal hematoma. There is mild dilatation of left lateral ventricle without evidence of entrapment. Small focus of susceptibility artifact in the left cerebellar hemisphere related to hemorrhagic transformation of late subacute infarct. Vascular: Major arterial flow voids are preserved at the  skull base. Skull and upper cervical spine: Diffuse decrease of the T1 signal within the high versus and upper cervical spine, likely related to red marrow in the setting chronic anemia. Sinuses/Orbits: Apparent flattening of the optic discs suggesting increased intracranial pressure. Mild mucosal thickening throughout the paranasal sinuses. Bilateral mastoid effusion, right greater than left. Other: None. MRA HEAD FINDINGS The study is significantly degraded by motion. Anterior circulation: Normal flow related enhancement of the intracranial internal carotid arteries. Flow related enhancement is seen in the bilateral M1 and A1 segments. Images above the M1 segment are essentially nondiagnostic. Although, there is asymmetry of flow related enhancement, diminished in the right MCA vascular tree compared to the left MCA. Posterior circulation: Flow related enhancement is present in the visualized portions of the intracranial vertebral arteries, basilar artery bilateral P1/PCA segments. Flow related enhancement is seen in the P2 segment with significant artifact at the level of the right P2 and bilateral distal PCA segments. IMPRESSION: 1. Large necrotic appearing lesion within the right frontal lobe within the area of prior right MCA territory infarct with new prominent surrounding vasogenic edema resulting in approximately 8 mm leftward midline shift. Given patient history of endocarditis, findings are concerning for abscess. 2. Significantly degraded MRA of the head due to motion. Asymmetry of flow related enhancement, diminished in the right MCA vascular tree compared to the left MCA. These results were called by telephone at the time of interpretation on 08/23/2023 at 1:37 pm to Dr. Marvel Plan, who verbally acknowledged these results. Electronically Signed   By: Baldemar Lenis M.D.   On: 08/23/2023 13:37   MR ANGIO HEAD WO CONTRAST  Result Date: 08/23/2023 CLINICAL DATA:  Stroke, follow-up.  EXAM: MRI HEAD WITHOUT CONTRAST MRA HEAD WITHOUT CONTRAST TECHNIQUE: Multiplanar, multi-echo pulse sequences of the brain and surrounding structures were acquired without intravenous contrast. Angiographic images of the Circle of Willis were acquired using MRA technique without intravenous contrast. COMPARISON:  Head CT August 20, 2023; MRI brain October 17 24. FINDINGS: MRI HEAD FINDINGS The study is significantly degraded by motion. Brain: A large necrotic appearing lesion is seen within the right frontal lobe within the area of prior right MCA territory infarct measuring approximately 5.3 x 3.7 x 3.7 cm. The lesion shows mixed content with areas of restricted and facilitated diffusion, and new prominent surrounding vasogenic edema which extends into the right basal ganglia, insular region, right frontal, parietal and temporal regions, resulting in significant mass effect with approximately a 8 mm leftward midline shift, similar to prior CT. Although susceptibility weighted images are severely degraded by motion, low susceptibility artifact is seen in this location to suggest hemorrhagic transformation/intraparenchymal hematoma. There is mild dilatation of left lateral ventricle without evidence of entrapment. Small focus of susceptibility artifact in the left cerebellar hemisphere related to hemorrhagic transformation of late subacute infarct. Vascular: Major arterial flow voids are preserved at the skull base. Skull and upper cervical spine: Diffuse decrease  of the T1 signal within the high versus and upper cervical spine, likely related to red marrow in the setting chronic anemia. Sinuses/Orbits: Apparent flattening of the optic discs suggesting increased intracranial pressure. Mild mucosal thickening throughout the paranasal sinuses. Bilateral mastoid effusion, right greater than left. Other: None. MRA HEAD FINDINGS The study is significantly degraded by motion. Anterior circulation: Normal flow related  enhancement of the intracranial internal carotid arteries. Flow related enhancement is seen in the bilateral M1 and A1 segments. Images above the M1 segment are essentially nondiagnostic. Although, there is asymmetry of flow related enhancement, diminished in the right MCA vascular tree compared to the left MCA. Posterior circulation: Flow related enhancement is present in the visualized portions of the intracranial vertebral arteries, basilar artery bilateral P1/PCA segments. Flow related enhancement is seen in the P2 segment with significant artifact at the level of the right P2 and bilateral distal PCA segments. IMPRESSION: 1. Large necrotic appearing lesion within the right frontal lobe within the area of prior right MCA territory infarct with new prominent surrounding vasogenic edema resulting in approximately 8 mm leftward midline shift. Given patient history of endocarditis, findings are concerning for abscess. 2. Significantly degraded MRA of the head due to motion. Asymmetry of flow related enhancement, diminished in the right MCA vascular tree compared to the left MCA. These results were called by telephone at the time of interpretation on 08/23/2023 at 1:37 pm to Dr. Marvel Plan, who verbally acknowledged these results. Electronically Signed   By: Baldemar Lenis M.D.   On: 08/23/2023 13:37   Medications:  [START ON 08/27/2023] vancomycin      apixaban  5 mg Oral BID   calcitRIOL  1.5 mcg Oral Q M,W,F   Chlorhexidine Gluconate Cloth  6 each Topical Daily   darbepoetin (ARANESP) injection - DIALYSIS  100 mcg Subcutaneous Q Fri-1800   feeding supplement (PROSource TF20)  60 mL Per Tube Daily   insulin aspart  0-15 Units Subcutaneous TID WC   insulin aspart  0-5 Units Subcutaneous QHS   [START ON 08/26/2023] insulin glargine-yfgn  10 Units Subcutaneous Daily   linezolid  600 mg Oral Q12H   metoprolol tartrate  50 mg Oral BID   midodrine  10 mg Oral Q M,W,F-HD   multivitamin  1  tablet Oral QHS   nutrition supplement (JUVEN)  1 packet Per Tube BID BM   mouth rinse  15 mL Mouth Rinse 4 times per day     OP HD: MWF [facility] 4h  400/1.5    77kg   2/3 bath  [facility] TDC     - Heparin none - last OP HD 10/7, post wt 74.6kg - misses HD once every other week approx - rocaltrol 1.50 mcg    Assessment/ Plan:   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 Not a candidate for surgery; CT surgery recommended management w/ antibiotics. SP line holiday w/ old TDC removed 10/16 and new TDC placed 10/18 by IR.  Needs IV vanc for 6 wks thru 09/19/23.  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 Repeat CT 11/22 shows extension of R MCA territory now involving large portion of right frontal and parietal lobes as well as right basal ganglia with petechial hemorrhage and possible small volume subarachnoid hemorrhage. Eliquis is now on hold and neuro seeing patient  ESRD: on HD MWF. SP CRRT 10/22- 10/30, back on iHD now. Next HD  08/27/2023 Volume: [place] 2L O2 dc'd on 11/10, remains on RA. Euvolemic on exam New dry wt 2-3 kg lower. .  Anemia: Hb 7- 9 range. ESA dose increased to 100 mcg q. weekly. S/P 5 units PRBCs since admission.  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 Monitor on renal diet.  On calcitriol for PTH control. Nutrition: cortrak in place, getting tube feeds, not eating much per RN Hypotension: Weaned off of pressors. On midodrine with HD HD access: SP line holiday with new L  femoral TDC placed on 10/18. 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 Dispo: he successfully dialyzed in a chair 11/18. Will not need to do this again unless there is some clinical change. Ok for dc to SNF.   Shane Wisher H. Amiel Mccaffrey NP-C 08/25/2023, 11:44 AM  [employer] [phone]

## 2023-08-25 NOTE — Plan of Care (Signed)
Pt is alert and oriented x 2. IV is patent and saline locked. Bath given this shift. Bowel smear this shift. Pt refused labs. Pt being sarcastic and cussing at staff throughout the day. Redirected behavior. Will continue with current plan of care.  Problem: Self-Care: Goal: Ability to participate in self-care as condition permits will improve Outcome: Progressing Goal: Verbalization of feelings and concerns over difficulty with self-care will improve Outcome: Progressing Goal: Ability to communicate needs accurately will improve Outcome: Progressing   Problem: Nutrition: Goal: Risk of aspiration will decrease Outcome: Progressing Goal: Dietary intake will improve Outcome: Progressing   Problem: Education: Goal: Knowledge of disease or condition will improve Outcome: Progressing Goal: Knowledge of secondary prevention will improve (MUST DOCUMENT ALL) Outcome: Progressing Goal: Knowledge of patient specific risk factors will improve Loraine Leriche N/A or DELETE if not current risk factor) Outcome: Progressing   Problem: Ischemic Stroke/TIA Tissue Perfusion: Goal: Complications of ischemic stroke/TIA will be minimized Outcome: Progressing   Problem: Coping: Goal: Will verbalize positive feelings about self Outcome: Progressing Goal: Will identify appropriate support needs Outcome: Progressing   Problem: Health Behavior/Discharge Planning: Goal: Ability to manage health-related needs will improve Outcome: Progressing Goal: Goals will be collaboratively established with patient/family Outcome: Progressing   Problem: Self-Care: Goal: Ability to participate in self-care as condition permits will improve Outcome: Progressing Goal: Verbalization of feelings and concerns over difficulty with self-care will improve Outcome: Progressing Goal: Ability to communicate needs accurately will improve Outcome: Progressing   Problem: Nutrition: Goal: Risk of aspiration will decrease Outcome:  Progressing Goal: Dietary intake will improve Outcome: Progressing   Problem: Safety: Goal: Non-violent Restraint(s) Outcome: Progressing   Problem: Education: Goal: Knowledge of General Education information will improve Description: Including pain rating scale, medication(s)/side effects and non-pharmacologic comfort measures Outcome: Progressing   Problem: Health Behavior/Discharge Planning: Goal: Ability to manage health-related needs will improve Outcome: Progressing   Problem: Clinical Measurements: Goal: Ability to maintain clinical measurements within normal limits will improve Outcome: Progressing Goal: Will remain free from infection Outcome: Progressing Goal: Diagnostic test results will improve Outcome: Progressing Goal: Respiratory complications will improve Outcome: Progressing Goal: Cardiovascular complication will be avoided Outcome: Progressing   Problem: Activity: Goal: Risk for activity intolerance will decrease Outcome: Progressing   Problem: Nutrition: Goal: Adequate nutrition will be maintained Outcome: Progressing   Problem: Coping: Goal: Level of anxiety will decrease Outcome: Progressing   Problem: Elimination: Goal: Will not experience complications related to bowel motility Outcome: Progressing Goal: Will not experience complications related to urinary retention Outcome: Progressing   Problem: Pain Management: Goal: General experience of comfort will improve Outcome: Progressing   Problem: Safety: Goal: Ability to remain free from injury will improve Outcome: Progressing   Problem: Skin Integrity: Goal: Risk for impaired skin integrity will decrease Outcome: Progressing

## 2023-08-25 NOTE — Progress Notes (Signed)
Lab came by to attempt 2nd blood culture , pt refused provider on call notified.

## 2023-08-25 NOTE — Progress Notes (Addendum)
Providing Compassionate, Quality Care - Together   Subjective: Patient reports some abdominal pain. Nursing staff report he is having regular bowel movements.  Objective: Vital signs in last 24 hours: Temp:  [97.4 F (36.3 C)-98.7 F (37.1 C)] 98.5 F (36.9 C) (11/27 0808) Pulse Rate:  [87-108] 108 (11/27 0808) Resp:  [13-23] 18 (11/27 0509) BP: (111-163)/(60-137) 154/104 (11/27 0808) SpO2:  [96 %-100 %] 100 % (11/27 0808) Weight:  [78 kg] 78 kg (11/26 1715)  Intake/Output from previous day: 11/26 0701 - 11/27 0700 In: 240 [P.O.:240] Out: 2000  Intake/Output this shift: No intake/output data recorded.  Alert Oriented to person, place, time, but not situation Speech clear, repeats my questions back to me MAE, left AKA Cortrak in left nare  Lab Results: Recent Labs    08/23/23 1418  WBC 9.9  HGB 9.7*  HCT 31.8*  PLT 206   BMET Recent Labs    08/22/23 1713 08/23/23 1418  NA  --  131*  K  --  4.7  CL  --  91*  CO2  --  22  GLUCOSE 406* 170*  BUN  --  98*  CREATININE  --  6.60*  CALCIUM  --  9.3    Studies/Results: MR BRAIN WO CONTRAST  Result Date: 08/23/2023 CLINICAL DATA:  Stroke, follow-up. EXAM: MRI HEAD WITHOUT CONTRAST MRA HEAD WITHOUT CONTRAST TECHNIQUE: Multiplanar, multi-echo pulse sequences of the brain and surrounding structures were acquired without intravenous contrast. Angiographic images of the Circle of Willis were acquired using MRA technique without intravenous contrast. COMPARISON:  Head CT August 20, 2023; MRI brain October 17 24. FINDINGS: MRI HEAD FINDINGS The study is significantly degraded by motion. Brain: A large necrotic appearing lesion is seen within the right frontal lobe within the area of prior right MCA territory infarct measuring approximately 5.3 x 3.7 x 3.7 cm. The lesion shows mixed content with areas of restricted and facilitated diffusion, and new prominent surrounding vasogenic edema which extends into the right  basal ganglia, insular region, right frontal, parietal and temporal regions, resulting in significant mass effect with approximately a 8 mm leftward midline shift, similar to prior CT. Although susceptibility weighted images are severely degraded by motion, low susceptibility artifact is seen in this location to suggest hemorrhagic transformation/intraparenchymal hematoma. There is mild dilatation of left lateral ventricle without evidence of entrapment. Small focus of susceptibility artifact in the left cerebellar hemisphere related to hemorrhagic transformation of late subacute infarct. Vascular: Major arterial flow voids are preserved at the skull base. Skull and upper cervical spine: Diffuse decrease of the T1 signal within the high versus and upper cervical spine, likely related to red marrow in the setting chronic anemia. Sinuses/Orbits: Apparent flattening of the optic discs suggesting increased intracranial pressure. Mild mucosal thickening throughout the paranasal sinuses. Bilateral mastoid effusion, right greater than left. Other: None. MRA HEAD FINDINGS The study is significantly degraded by motion. Anterior circulation: Normal flow related enhancement of the intracranial internal carotid arteries. Flow related enhancement is seen in the bilateral M1 and A1 segments. Images above the M1 segment are essentially nondiagnostic. Although, there is asymmetry of flow related enhancement, diminished in the right MCA vascular tree compared to the left MCA. Posterior circulation: Flow related enhancement is present in the visualized portions of the intracranial vertebral arteries, basilar artery bilateral P1/PCA segments. Flow related enhancement is seen in the P2 segment with significant artifact at the level of the right P2 and bilateral distal PCA segments. IMPRESSION: 1. Large  necrotic appearing lesion within the right frontal lobe within the area of prior right MCA territory infarct with new prominent  surrounding vasogenic edema resulting in approximately 8 mm leftward midline shift. Given patient history of endocarditis, findings are concerning for abscess. 2. Significantly degraded MRA of the head due to motion. Asymmetry of flow related enhancement, diminished in the right MCA vascular tree compared to the left MCA. These results were called by telephone at the time of interpretation on 08/23/2023 at 1:37 pm to Dr. Marvel Plan, who verbally acknowledged these results. Electronically Signed   By: Baldemar Lenis M.D.   On: 08/23/2023 13:37   MR ANGIO HEAD WO CONTRAST  Result Date: 08/23/2023 CLINICAL DATA:  Stroke, follow-up. EXAM: MRI HEAD WITHOUT CONTRAST MRA HEAD WITHOUT CONTRAST TECHNIQUE: Multiplanar, multi-echo pulse sequences of the brain and surrounding structures were acquired without intravenous contrast. Angiographic images of the Circle of Willis were acquired using MRA technique without intravenous contrast. COMPARISON:  Head CT August 20, 2023; MRI brain October 17 24. FINDINGS: MRI HEAD FINDINGS The study is significantly degraded by motion. Brain: A large necrotic appearing lesion is seen within the right frontal lobe within the area of prior right MCA territory infarct measuring approximately 5.3 x 3.7 x 3.7 cm. The lesion shows mixed content with areas of restricted and facilitated diffusion, and new prominent surrounding vasogenic edema which extends into the right basal ganglia, insular region, right frontal, parietal and temporal regions, resulting in significant mass effect with approximately a 8 mm leftward midline shift, similar to prior CT. Although susceptibility weighted images are severely degraded by motion, low susceptibility artifact is seen in this location to suggest hemorrhagic transformation/intraparenchymal hematoma. There is mild dilatation of left lateral ventricle without evidence of entrapment. Small focus of susceptibility artifact in the left  cerebellar hemisphere related to hemorrhagic transformation of late subacute infarct. Vascular: Major arterial flow voids are preserved at the skull base. Skull and upper cervical spine: Diffuse decrease of the T1 signal within the high versus and upper cervical spine, likely related to red marrow in the setting chronic anemia. Sinuses/Orbits: Apparent flattening of the optic discs suggesting increased intracranial pressure. Mild mucosal thickening throughout the paranasal sinuses. Bilateral mastoid effusion, right greater than left. Other: None. MRA HEAD FINDINGS The study is significantly degraded by motion. Anterior circulation: Normal flow related enhancement of the intracranial internal carotid arteries. Flow related enhancement is seen in the bilateral M1 and A1 segments. Images above the M1 segment are essentially nondiagnostic. Although, there is asymmetry of flow related enhancement, diminished in the right MCA vascular tree compared to the left MCA. Posterior circulation: Flow related enhancement is present in the visualized portions of the intracranial vertebral arteries, basilar artery bilateral P1/PCA segments. Flow related enhancement is seen in the P2 segment with significant artifact at the level of the right P2 and bilateral distal PCA segments. IMPRESSION: 1. Large necrotic appearing lesion within the right frontal lobe within the area of prior right MCA territory infarct with new prominent surrounding vasogenic edema resulting in approximately 8 mm leftward midline shift. Given patient history of endocarditis, findings are concerning for abscess. 2. Significantly degraded MRA of the head due to motion. Asymmetry of flow related enhancement, diminished in the right MCA vascular tree compared to the left MCA. These results were called by telephone at the time of interpretation on 08/23/2023 at 1:37 pm to Dr. Marvel Plan, who verbally acknowledged these results. Electronically Signed   By: Jerilynn Mages  de Melchor Amour M.D.   On: 08/23/2023 13:37    Assessment/Plan: Patient with right MCA infarct, with secondary cerebritis. No surgical intervention recommended. Recommend antibiotic therapy per Infectious Disease.   LOS: 46 days    Val Eagle, DNP, AGNP-C Nurse Practitioner  [facility] [address], [address], [place], [name] [address] P: [phone]    F: [phone]  08/25/2023, 10:49 AM

## 2023-08-25 NOTE — TOC Progression Note (Signed)
Transition of Care [facility]) - Progression Note    Patient Details  Name: [name] MRN: [id number] Date of Birth: 1981/04/27  [facility] CM/SW Contact  Baldemar Lenis, [name] Phone Number: 08/25/2023, 3:22 PM  Clinical Narrative:   CSW contacted patient's mother, [name], to discuss recommendation for SNF and provide bed offers. [name] stated she was not at home and it was not a good time, asking for CSW to call her back on Friday. CSW to follow.    Expected Discharge Plan: Skilled Nursing Facility Barriers to Discharge: Continued Medical Work up, English as a second language teacher  Expected Discharge Plan and Services       Living arrangements for the past 2 months: Single Family Home                                       Social Determinants of Health (SDOH) Interventions SDOH Screenings   Food Insecurity: No Food Insecurity (08/03/2023)  Housing: Low Risk  (08/03/2023)  Transportation Needs: No Transportation Needs (08/03/2023)  Utilities: Not At Risk (08/03/2023)  Alcohol Screen: Low Risk  (02/15/2023)  Depression (PHQ2-9): Low Risk  (02/15/2023)  Financial Resource Strain: Low Risk  (02/15/2023)  Physical Activity: Inactive (02/15/2023)  Social Connections: Socially Integrated (02/15/2023)  Stress: No Stress Concern Present (02/15/2023)  Tobacco Use: Low Risk  (08/03/2023)    Readmission Risk Interventions     No data to display

## 2023-08-25 NOTE — Discharge Instructions (Signed)
Information on my medicine - ELIQUIS (apixaban)  This medication education was reviewed with me or my healthcare representative as part of my discharge preparation.    Why was Eliquis prescribed for you? Eliquis was prescribed to treat blood clots that may have been found in the veins of your legs (deep vein thrombosis) or in your lungs (pulmonary embolism) and to reduce the risk of them occurring again.  What do You need to know about Eliquis ? The dose is ONE 5 mg tablet taken TWICE daily.  Eliquis may be taken with or without food.   Try to take the dose about the same time in the morning and in the evening. If you have difficulty swallowing the tablet whole please discuss with your pharmacist how to take the medication safely.  Take Eliquis exactly as prescribed and DO NOT stop taking Eliquis without talking to the doctor who prescribed the medication.  Stopping may increase your risk of developing a new blood clot.  Refill your prescription before you run out.  After discharge, you should have regular check-up appointments with your healthcare provider that is prescribing your Eliquis.    What do you do if you miss a dose? If a dose of ELIQUIS is not taken at the scheduled time, take it as soon as possible on the same day and twice-daily administration should be resumed. The dose should not be doubled to make up for a missed dose.  Important Safety Information A possible side effect of Eliquis is bleeding. You should call your healthcare provider right away if you experience any of the following: Bleeding from an injury or your nose that does not stop. Unusual colored urine (red or dark brown) or unusual colored stools (red or black). Unusual bruising for unknown reasons. A serious fall or if you hit your head (even if there is no bleeding).  Some medicines may interact with Eliquis and might increase your risk of bleeding or clotting while on Eliquis. To help avoid this,  consult your healthcare provider or pharmacist prior to using any new prescription or non-prescription medications, including herbals, vitamins, non-steroidal anti-inflammatory drugs (NSAIDs) and supplements.  This website has more information on Eliquis (apixaban): http://www.eliquis.com/eliquis/home  ============================== Deep Vein Thrombosis    Deep vein thrombosis (DVT) is a condition in which a blood clot forms in a deep vein, such as a lower leg, thigh, or arm vein. A clot is blood that has thickened into a gel or solid. This condition is dangerous. It can lead to serious and even life-threatening complications if the clot travels to the lungs and causes a blockage (pulmonary embolism). It can also damage veins in the leg. This can result in leg pain, swelling, discoloration, and sores (post-thrombotic syndrome).  What are the causes? This condition may be caused by: A slowdown of blood flow. Damage to a vein. A condition that causes blood to clot more easily, such as an inherited clotting disorder.  What increases the risk? The following factors may make you more likely to develop this condition: Being overweight. Being older, especially over age 65. Sitting or lying down for more than four hours. Being in the hospital. Lack of physical activity (sedentary lifestyle). Pregnancy, being in childbirth, or having recently given birth. Taking medicines that contain estrogen, such as medicines to prevent pregnancy. Smoking. A history of any of the following: Blood clots or a blood clotting disease. Peripheral vascular disease. Inflammatory bowel disease. Cancer. Heart disease. Genetic conditions that affect how your blood  clots, such as Factor V Leiden mutation. Neurological diseases that affect your legs (leg paresis). A recent injury, such as a car accident. Major or lengthy surgery. A central line placed inside a large vein.  What are the signs or  symptoms? Symptoms of this condition include: Swelling, pain, or tenderness in an arm or leg. Warmth, redness, or discoloration in an arm or leg. If the clot is in your leg, symptoms may be more noticeable or worse when you stand or walk. Some people may not develop any symptoms.  How is this diagnosed? This condition is diagnosed with: A medical history and physical exam. Tests, such as: Blood tests. These are done to check how well your blood clots. Ultrasound. This is done to check for clots. Venogram. For this test, contrast dye is injected into a vein and X-rays are taken to check for any clots  How is this treated? Treatment for this condition depends on: The cause of your DVT. Your risk for bleeding or developing more clots. Any other medical conditions that you have. Treatment may include: Taking a blood thinner (anticoagulant). This type of medicine prevents clots from forming. It may be taken by mouth, injected under the skin, or injected through an IV (catheter). Injecting clot-dissolving medicines into the affected vein (catheter-directed thrombolysis). Having surgery. Surgery may be done to: Remove the clot. Place a filter in a large vein to catch blood clots before they reach the lungs. Some treatments may be continued for up to six months.  Follow these instructions at home: If you are taking blood thinners: Take the medicine exactly as told by your health care provider. Some blood thinners need to be taken at the same time every day. Do not skip a dose. Talk with your health care provider before you take any medicines that contain aspirin or NSAIDs. These medicines increase your risk for dangerous bleeding. Ask your health care provider about foods and drugs that could change the way the medicine works (may interact). Avoid those things if your health care provider tells you to do so. Blood thinners can cause easy bruising and may make it difficult to stop bleeding.  Because of this: Be very careful when using knives, scissors, or other sharp objects. Use an electric razor instead of a blade. Avoid activities that could cause injury or bruising, and follow instructions about how to prevent falls. Wear a medical alert bracelet or carry a card that lists what medicines you take.  General instructions Take over-the-counter and prescription medicines only as told by your health care provider. Return to your normal activities as told by your health care provider. Ask your health care provider what activities are safe for you. Wear compression stockings if recommended by your health care provider. Keep all follow-up visits as told by your health care provider. This is important.  How is this prevented? To lower your risk of developing this condition again: For 30 or more minutes every day, do an activity that: Involves moving your arms and legs. Increases your heart rate. When traveling for longer than four hours: Exercise your arms and legs every hour. Drink plenty of water. Avoid drinking alcohol. Avoid sitting or lying for a long time without moving your legs. If you have surgery or you are hospitalized, ask about ways to prevent blood clots. These may include taking frequent walks or using anticoagulants. Stay at a healthy weight. If you are a woman who is older than age 61, avoid unnecessary use of medicines  that contain estrogen, such as some birth control pills. Do not use any products that contain nicotine or tobacco, such as cigarettes and e-cigarettes. This is especially important if you take estrogen medicines. If you need help quitting, ask your health care provider.  Contact a health care provider if: You miss a dose of your blood thinner. Your menstrual period is heavier than usual. You have unusual bruising.  Get help right away if: You have: New or increased pain, swelling, or redness in an arm or leg. Numbness or tingling in an arm  or leg. Shortness of breath. Chest pain. A rapid or irregular heartbeat. A severe headache or confusion. A cut that will not stop bleeding. There is blood in your vomit, stool, or urine. You have a serious fall or accident, or you hit your head. You feel light-headed or dizzy. You cough up blood.  These symptoms may represent a serious problem that is an emergency. Do not wait to see if the symptoms will go away. Get medical help right away. Call your local emergency services (911 in the [place] Do not drive yourself to the hospital. Summary Deep vein thrombosis (DVT) is a condition in which a blood clot forms in a deep vein, such as a lower leg, thigh, or arm vein. Symptoms can include swelling, warmth, pain, and redness in your leg or arm. This condition may be treated with a blood thinner (anticoagulant medicine), medicine that is injected to dissolve blood clots,compression stockings, or surgery. If you are prescribed blood thinners, take them exactly as told. This information is not intended to replace advice given to you by your health care provider. Make sure you discuss any questions you have with your health care provider. Document Revised: 08/27/2017 Document Reviewed: 02/12/2017 Elsevier Patient Education  2020 [employer].

## 2023-08-26 DIAGNOSIS — I059 Rheumatic mitral valve disease, unspecified: Secondary | ICD-10-CM | POA: Diagnosis not present

## 2023-08-26 LAB — GLUCOSE, CAPILLARY
Glucose-Capillary: 170 mg/dL — ABNORMAL HIGH (ref 70–99)
Glucose-Capillary: 181 mg/dL — ABNORMAL HIGH (ref 70–99)
Glucose-Capillary: 227 mg/dL — ABNORMAL HIGH (ref 70–99)
Glucose-Capillary: 33 mg/dL — CL (ref 70–99)
Glucose-Capillary: 40 mg/dL — CL (ref 70–99)
Glucose-Capillary: 61 mg/dL — ABNORMAL LOW (ref 70–99)
Glucose-Capillary: 63 mg/dL — ABNORMAL LOW (ref 70–99)
Glucose-Capillary: 65 mg/dL — ABNORMAL LOW (ref 70–99)
Glucose-Capillary: 75 mg/dL (ref 70–99)
Glucose-Capillary: 81 mg/dL (ref 70–99)

## 2023-08-26 MED ORDER — GLUCOSE 40 % PO GEL
ORAL | Status: AC
  Filled 2023-08-26: qty 1.21

## 2023-08-26 MED ORDER — GLUCOSE 40 % PO GEL
ORAL | Status: AC
  Administered 2023-08-26: 62 g via ORAL
  Filled 2023-08-26: qty 1.21

## 2023-08-26 MED ORDER — GLUCOSE 40 % PO GEL: 2.0000 | ORAL | Status: AC

## 2023-08-26 NOTE — Progress Notes (Signed)
   08/26/23 2250  Provider Notification  Provider Name/Title Myra Gianotti  Date Provider Notified 08/26/23  Time Provider Notified 2250  Method of Notification Page  Notification Reason Red med refusal  Provider response Other (Comment) (PROVIDER AWARE)  Date of Provider Response 08/26/23  Time of Provider Response 2250

## 2023-08-26 NOTE — Progress Notes (Addendum)
[facility] Progress Note   Subjective: Seen and examined at bedside. Hypoglycemic event today. He's alert. No complaints.   Objective Vitals:   08/26/23 0034 08/26/23 0413 08/26/23 0818 08/26/23 1156  BP: (!) 139/127 (!) 158/96 (!) 180/111 129/72  Pulse: 87 83 88 88  Resp: 19 18 18 18   Temp: 97.9 F (36.6 C) 98.1 F (36.7 C) 97.6 F (36.4 C) (!) 97.5 F (36.4 C)  TempSrc: Oral Axillary Axillary Oral  SpO2:  100% 100% 100%  Weight:      Height:       Physical Exam General: Chronically ill appearing, nad Heart: RRR  Lungs: Clear bilaterally  Abdomen: Soft non -tender  Extremities: L AKA no stump edema.RUE edema  Dialysis Access: L Femoral [facility]     Additional Objective Labs: Basic Metabolic Panel: Recent Labs  Lab 08/20/23 1751 08/22/23 1713 08/23/23 1418 08/25/23 1706  NA 131*  --  131* 126*  K 5.0  --  4.7 5.1  CL 94*  --  91* 89*  CO2 23  --  22 23  GLUCOSE 114* 406* 170* 290*  BUN 94*  --  98* 81*  CREATININE 6.70*  --  6.60* 6.21*  CALCIUM 8.9  --  9.3 9.4  PHOS 4.6  --   --  4.9*   Liver Function Tests: Recent Labs  Lab 08/20/23 1751 08/25/23 1706  ALBUMIN 2.4* 2.8*   No results for input(s): "LIPASE", "AMYLASE" in the last 168 hours. CBC: Recent Labs  Lab 08/20/23 0815 08/21/23 1556 08/21/23 1755 08/23/23 1418 08/25/23 1706  WBC 20.9* 21.8* 21.1* 9.9 8.8  HGB 10.2* 9.9* 8.8* 9.7* 9.8*  HCT 31.9* 31.3* 27.9* 31.8* 31.2*  MCV 92.2 92.1 92.7 92.7 91.2  PLT 226 159 186 206 176   Blood Culture    Component Value Date/Time   SDES BLOOD SITE NOT SPECIFIED 08/25/2023 1706   SPECREQUEST  08/25/2023 1706    BOTTLES DRAWN AEROBIC ONLY Blood Culture results may not be optimal due to an inadequate volume of blood received in culture bottles   CULT  08/25/2023 1706    NO GROWTH < 24 HOURS Performed at [facility], [address] [place], [name] [address]    REPTSTATUS PENDING 08/25/2023 1706    Cardiac Enzymes: No  results for input(s): "CKTOTAL", "CKMB", "CKMBINDEX", "TROPONINI" in the last 168 hours. CBG: Recent Labs  Lab 08/26/23 0621 08/26/23 1158 08/26/23 1213 08/26/23 1237 08/26/23 1251  GLUCAP 181* 63* 40* 33* 65*   Iron Studies: No results for input(s): "IRON", "TIBC", "TRANSFERRIN", "FERRITIN" in the last 72 hours. @lablastinr3 @ Studies/Results: No results found. Medications:  [START ON 08/27/2023] vancomycin      apixaban  5 mg Oral BID   calcitRIOL  1.5 mcg Oral Q M,W,F   Chlorhexidine Gluconate Cloth  6 each Topical Daily   darbepoetin (ARANESP) injection - DIALYSIS  100 mcg Subcutaneous Q Fri-1800   feeding supplement (PROSource TF20)  60 mL Per Tube Daily   insulin aspart  0-15 Units Subcutaneous TID WC   insulin aspart  0-5 Units Subcutaneous QHS   insulin glargine-yfgn  10 Units Subcutaneous Daily   linezolid  600 mg Oral Q12H   metoprolol tartrate  50 mg Oral BID   midodrine  10 mg Oral Q M,W,F-HD   multivitamin  1 tablet Oral QHS   nutrition supplement (JUVEN)  1 packet Per Tube BID BM   mouth rinse  15 mL Mouth Rinse 4 times per  day     OP HD: MWF [facility] 4h  400/1.5    77kg   2/3 bath  [facility] TDC     - Heparin none - last OP HD 10/7, post wt 74.6kg - misses HD once every other week approx - rocaltrol 1.50 mcg    Assessment/ Plan:   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 Not a candidate for surgery; CT surgery recommended management w/ antibiotics. SP line holiday w/ new TDC placed 10/18 by IR.  Needs IV vanc for 6 wks thru 09/19/23.  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 Repeat CT 11/22 shows extension of R MCA territory now involving large portion of right frontal and parietal lobes as well as right basal ganglia with petechial hemorrhage and possible small volume subarachnoid hemorrhage. No surgical intervention per neurosurgery.  ESRD: on HD MWF. SP CRRT 10/22- 10/30, back on  iHD now. Next HD 08/27/2023 Volume: Remains on RA. No gross volume overload.  New dry wt around 74-75 kg .  Anemia: Hb 9s stable. ESA dose increased to 100 mcg q. weekly. S/P 5 units PRBCs since admission.  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 Monitor on renal diet.  On calcitriol for PTH control. Nutrition: cortrak in place, getting tube feeds, not eating much per RN Hypotension: Weaned off of pressors. On midodrine with HD HD access:  L  femoral TDC placed on 10/18. 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 Dispo: he successfully dialyzed in a chair 11/18. Will not need to do this again unless there is some clinical change. Ok for dc to SNF.   Tomasa Blase PA-C [facility] 08/26/2023,1:04 PM   I was available. Agree with the assessment/plan as outlined above by Susann Givens PA-C. Antonetta Clanton,MD 08/26/2023 1:45 PM

## 2023-08-26 NOTE — Progress Notes (Signed)
TRH night cross cover note:   I was notified by RN that the patient is refusing his evening medications, including eliquis, metoprolol, as well as zyvox.     Shane Pigg, DO Hospitalist

## 2023-08-26 NOTE — Progress Notes (Signed)
PROGRESS NOTE    [name]  ZOX:[id number] DOB: 01/30/1981 DOA: 07/09/2023 PCP: [name], NP    Brief Narrative:  42 year old gentleman with history of ESRD on hemodialysis, insulin-dependent type 2 diabetes, gastroparesis admitted since 10/11 where he presented with confusion, encephalopathic was agitation blood glucose 500.  Complicated hospital stay.  Sepsis.  MRSA bacteremia.  ICU admission.  Significant hospital events as below.  10/11, admitted with altered mental status and DKA.  Blood cultures positive for MRSA.  TEE with ejection fraction 30 to 35%, mitral and tricuspid valve endocarditis.  Seen by cardiothoracic surgery, felt to be poor candidate.  ID recommended 6 weeks of vancomycin. 10/17, found to have acute right MCA stroke, left occipital and bilateral cerebellum consistent with septic emboli.  Neuroconsulted. 10/18, tunneled left femoral vascular catheter placed after line holiday.  Patient developed worsening lethargy and confusion, intubated overnight and transferred to ICU. 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  On increasing vasopressor requirement.  Extubated on 10/24. Failed swallow evaluation, multiple blood transfusions, hypoglycemic events.  On CRRT in ICU. 11/21-22, poor oral intake.  Repeat stroke workup for altered mental status showed worsening MCA territory infarct with petechial hemorrhage. 11/25, MRI questionable abscess or cerebritis.  Neurosurgery does not recommend any intervention.  ID recommended to give with Zyvox for 2 weeks on top of vancomycin that he is already receiving.  Remains in overall  poor clinical status but, clinically stabilizing now.  Oral intake remains challenging. Previous hospitalist tried multiple times to transfer patient to [place], [facility] or [facility] declined transfer.  CT surgery does not recommend any valve surgery.  Subjective: Patient was seen and examined. No  overnight events. He insists on going home.  He finished 100% of his breakfast including french toast and sauce and drank whole can of soda in front of me.   he insisted on me talking to his mother and let him go home, I dialed her multiple times but could not get through. Eating 100% meal, coretrak kinked, will remove.     Assessment & Plan:   Acute right MCA, expanding with new midline shift 08/20/2023. Acute metabolic encephalopathy with delirium and agitation, resolving. Currently mental status is improving.  He is off all the sedation and narcotics.  Suspected abscess or cerebritis.  Antibiotics recommendation as below.  Dysphagia, severe protein calorie malnutrition: Multiple episodes of encephalopathy and poor oral intake. Currently consuming most of the meal. Discontinue coretrak.  Hopefully he can go back on regular diet soon.  Septic shock, mitral and tricuspid valve endocarditis, MRSA bacteremia: Resolved.  On midodrine.  Not a surgical candidate. Vancomycin with hemodialysis until 09/19/2023 Linezolid for 2 weeks starting 11/26  Acute on chronic combined systolic and diastolic heart failure: Now on dialysis.  Continue metoprolol.  Euvolemic.  Lower extremity DVT: Therapeutic on Eliquis  ESRD on hemodialysis: On his schedule of dialysis.  He had line holiday and then a tunneled catheter was placed on the left femoral area on 10/18.  Receiving dialysis. Anemia thrombocytopenia due to critical illness: Total 5 units of PRBC.  Pressure Injury 08/03/23 Buttocks Left Stage 2 -  Partial thickness loss of dermis presenting as a shallow open injury with a red, pink wound bed without slough. Several small open areas, right buttocks area (Active)  08/03/23 1945  Location: Buttocks  Location Orientation: Left  Staging: Stage 2 -  Partial thickness loss of dermis presenting as a shallow open injury with a red,  pink wound bed without slough.  Wound Description (Comments): Several  small open areas, right buttocks area  Present on Admission: No (present on transfer to [facility])         DVT prophylaxis: Place and maintain sequential compression device Start: 07/30/23 1415 apixaban (ELIQUIS) tablet 5 mg   Code Status: Full code Family Communication: None at the bedside, multiple attempts to call mother unsuccessful.  Disposition Plan: Status is: Inpatient Remains inpatient appropriate because: IV antibiotics, safe disposition plan pending     Consultants:  Neurology Nephrology Palliative care CT surgery  Procedures:  Multiple procedures as above  Antimicrobials:  Vancomycin and linezolid     Objective: Vitals:   08/25/23 2119 08/26/23 0034 08/26/23 0413 08/26/23 0818  BP: (!) 162/104 (!) 139/127 (!) 158/96 (!) 180/111  Pulse: 96 87 83 88  Resp:  19 18 18   Temp:  97.9 F (36.6 C) 98.1 F (36.7 C) 97.6 F (36.4 C)  TempSrc:  Oral Axillary Axillary  SpO2:   100% 100%  Weight:      Height:        Intake/Output Summary (Last 24 hours) at 08/26/2023 0946 Last data filed at 08/25/2023 2043 Gross per 24 hour  Intake 450 ml  Output --  Net 450 ml   Filed Weights   08/21/23 0500 08/24/23 1328 08/24/23 1715  Weight: 75.3 kg 78 kg 78 kg    Examination:  General exam: Appears calm and slightly impulsive. Occasionally foul mouthing.  On room air.  Talkative. Respiratory system: No added sounds. Cardiovascular system: S1 & S2 heard, RRR.  Gastrointestinal system: Soft.  Nontender. Central nervous system: Alert and awake.  He is oriented to himself and his family.  Slightly impulsive.   Left below-knee amputation. Chronic left hemiplegia.     Data Reviewed: I have personally reviewed following labs and imaging studies  CBC: Recent Labs  Lab 08/20/23 0815 08/21/23 1556 08/21/23 1755 08/23/23 1418 08/25/23 1706  WBC 20.9* 21.8* 21.1* 9.9 8.8  HGB 10.2* 9.9* 8.8* 9.7* 9.8*  HCT 31.9* 31.3* 27.9* 31.8* 31.2*  MCV 92.2 92.1 92.7 92.7  91.2  PLT 226 159 186 206 176   Basic Metabolic Panel: Recent Labs  Lab 08/20/23 0222 08/20/23 1751 08/21/23 1556 08/22/23 0426 08/22/23 1713 08/23/23 1418 08/24/23 0430 08/25/23 1706  NA  --  131*  --   --   --  131*  --  126*  K  --  5.0  --   --   --  4.7  --  5.1  CL  --  94*  --   --   --  91*  --  89*  CO2  --  23  --   --   --  22  --  23  GLUCOSE  --  114*  --   --  406* 170*  --  290*  BUN  --  94*  --   --   --  98*  --  81*  CREATININE  --  6.70*  --   --   --  6.60*  --  6.21*  CALCIUM  --  8.9  --   --   --  9.3  --  9.4  MG 2.6*  --  2.8* 2.6*  --   --  3.1*  --   PHOS  --  4.6  --   --   --   --   --  4.9*   GFR: Estimated Creatinine Clearance:  14.3 mL/min (A) (by C-G formula based on SCr of 6.21 mg/dL (H)). Liver Function Tests: Recent Labs  Lab 08/20/23 1751 08/25/23 1706  ALBUMIN 2.4* 2.8*   No results for input(s): "LIPASE", "AMYLASE" in the last 168 hours. No results for input(s): "AMMONIA" in the last 168 hours. Coagulation Profile: No results for input(s): "INR", "PROTIME" in the last 168 hours. Cardiac Enzymes: No results for input(s): "CKTOTAL", "CKMB", "CKMBINDEX", "TROPONINI" in the last 168 hours. BNP (last 3 results) No results for input(s): "PROBNP" in the last 8760 hours. HbA1C: No results for input(s): "HGBA1C" in the last 72 hours. CBG: Recent Labs  Lab 08/25/23 0629 08/25/23 1238 08/25/23 1640 08/25/23 2118 08/26/23 0621  GLUCAP 112* 227* 265* 200* 181*   Lipid Profile: No results for input(s): "CHOL", "HDL", "LDLCALC", "TRIG", "CHOLHDL", "LDLDIRECT" in the last 72 hours. Thyroid Function Tests: No results for input(s): "TSH", "T4TOTAL", "FREET4", "T3FREE", "THYROIDAB" in the last 72 hours. Anemia Panel: No results for input(s): "VITAMINB12", "FOLATE", "FERRITIN", "TIBC", "IRON", "RETICCTPCT" in the last 72 hours. Sepsis Labs: No results for input(s): "PROCALCITON", "LATICACIDVEN" in the last 168 hours.  Recent Results  (from the past 240 hour(s))  Culture, blood (Routine X 2) w Reflex to ID Panel     Status: None (Preliminary result)   Collection Time: 08/24/23  6:52 PM   Specimen: BLOOD  Result Value Ref Range Status   Specimen Description BLOOD SITE NOT SPECIFIED  Final   Special Requests   Final    BOTTLES DRAWN AEROBIC AND ANAEROBIC Blood Culture results may not be optimal due to an inadequate volume of blood received in culture bottles   Culture   Final    NO GROWTH 2 DAYS Performed at [facility], [address], [place], [name] [address]    Report Status PENDING  Incomplete  Culture, blood (Routine X 2) w Reflex to ID Panel     Status: None (Preliminary result)   Collection Time: 08/25/23  5:06 PM   Specimen: BLOOD  Result Value Ref Range Status   Specimen Description BLOOD SITE NOT SPECIFIED  Final   Special Requests   Final    BOTTLES DRAWN AEROBIC ONLY Blood Culture results may not be optimal due to an inadequate volume of blood received in culture bottles   Culture   Final    NO GROWTH < 24 HOURS Performed at [facility], [address], [place], [name] [address]    Report Status PENDING  Incomplete         Radiology Studies: No results found.      Scheduled Meds:  apixaban  5 mg Oral BID   calcitRIOL  1.5 mcg Oral Q M,W,F   Chlorhexidine Gluconate Cloth  6 each Topical Daily   darbepoetin (ARANESP) injection - DIALYSIS  100 mcg Subcutaneous Q Fri-1800   feeding supplement (PROSource TF20)  60 mL Per Tube Daily   insulin aspart  0-15 Units Subcutaneous TID WC   insulin aspart  0-5 Units Subcutaneous QHS   insulin glargine-yfgn  10 Units Subcutaneous Daily   linezolid  600 mg Oral Q12H   metoprolol tartrate  50 mg Oral BID   midodrine  10 mg Oral Q M,W,F-HD   multivitamin  1 tablet Oral QHS   nutrition supplement (JUVEN)  1 packet Per Tube BID BM   mouth rinse  15 mL Mouth Rinse 4 times per day   Continuous Infusions:  [START ON 08/27/2023] vancomycin  LOS: 47 days    Time spent: 40 minutes    Dorcas Carrow, MD [facility]

## 2023-08-26 NOTE — Progress Notes (Signed)
Pharmacy Antibiotic Note  [name] is a 42 y.o. 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 Pharmacy has been consulted for Vancomycin dosing.   11/22 Vancomycin random pre-HD 28 above goal range (15-25). Patient received HD 11/26 with vancomycin 500mg  dose after HD, next session planned for 11/29, 500 mg dose scheduled for that evening. Next random level will be drawn 11/29 with AM labs.  Plan: - Vancomycin 500 mg IV post HD 11/26 then MWF  - Will monitor HD tolerance and dosing - Plan weekly levels on Friday with note  - Tentative stop date: 09/19/23 - entered  Height: 5\' 7"  (170.2 cm) Weight: 78 kg (172 lb) (bed, bed may need to be zero out) IBW/kg (Calculated) : 66.1  Temp (24hrs), Avg:97.9 F (36.6 C), Min:97.6 F (36.4 C), Max:98.1 F (36.7 C)  Recent Labs  Lab 08/20/23 0222 08/20/23 0815 08/20/23 1751 08/21/23 1556 08/21/23 1755 08/23/23 1418 08/25/23 1706  WBC  --  20.9*  --  21.8* 21.1* 9.9 8.8  CREATININE  --   --  6.70*  --   --  6.60* 6.21*  VANCORANDOM 28  --   --   --   --   --   --     Estimated Creatinine Clearance: 14.3 mL/min (A) (by C-G formula based on SCr of 6.21 mg/dL (H)).    No Known Allergies  Antimicrobials this admission: Vancomycin 10/12 >>(12/22) Cefepime 10/21 >> 10/23; restart 10/25 >> 10/29 Linezolid 11/27 >> (12/10)  Vancomycin levels  -10/17 preHD>>25 -10/24: VR= 16 -11/1: VR= 24 -11/8 VR 22 -11/15 VR 22  -11/22 VR 28 - held dose x1, decrease to 500mg   Microbiology results: 10/12 MRSA PCR >> positive 10/12 CDiff antigen +, toxin -, PCR - >> negative 10/12 BCx >> 4/4 MRSA 10/13 BCx >> NGf 10/17 BCx >> NGf 10/21 RCx >> NOF 11/26 Bcx >> ngtd x2d 11/27 Bcx >> NG < 12hrs   Thank you for allowing pharmacy to be a part of this patient's care.  Ernestene Kiel, PharmD PGY1 Pharmacy Resident  Please check AMION for all [facility] Pharmacy phone numbers After 10:00 PM, call Main  Pharmacy [phone]

## 2023-08-27 DIAGNOSIS — I82722 Chronic embolism and thrombosis of deep veins of left upper extremity: Secondary | ICD-10-CM | POA: Diagnosis not present

## 2023-08-27 DIAGNOSIS — R000 Tachycardia, unspecified: Secondary | ICD-10-CM | POA: Diagnosis not present

## 2023-08-27 DIAGNOSIS — I120 Hypertensive chronic kidney disease with stage 5 chronic kidney disease or end stage renal disease: Secondary | ICD-10-CM | POA: Diagnosis not present

## 2023-08-27 DIAGNOSIS — E10649 Type 1 diabetes mellitus with hypoglycemia without coma: Secondary | ICD-10-CM | POA: Diagnosis not present

## 2023-08-27 DIAGNOSIS — E160 Drug-induced hypoglycemia without coma: Secondary | ICD-10-CM | POA: Diagnosis not present

## 2023-08-27 DIAGNOSIS — L97519 Non-pressure chronic ulcer of other part of right foot with unspecified severity: Secondary | ICD-10-CM | POA: Diagnosis not present

## 2023-08-27 DIAGNOSIS — A4102 Sepsis due to Methicillin resistant Staphylococcus aureus: Secondary | ICD-10-CM | POA: Diagnosis not present

## 2023-08-27 DIAGNOSIS — K3184 Gastroparesis: Secondary | ICD-10-CM | POA: Diagnosis not present

## 2023-08-27 DIAGNOSIS — D6959 Other secondary thrombocytopenia: Secondary | ICD-10-CM | POA: Diagnosis not present

## 2023-08-27 DIAGNOSIS — I6340 Cerebral infarction due to embolism of unspecified cerebral artery: Secondary | ICD-10-CM | POA: Diagnosis not present

## 2023-08-27 DIAGNOSIS — D61818 Other pancytopenia: Secondary | ICD-10-CM | POA: Diagnosis not present

## 2023-08-27 DIAGNOSIS — I82C13 Acute embolism and thrombosis of internal jugular vein, bilateral: Secondary | ICD-10-CM | POA: Diagnosis not present

## 2023-08-27 DIAGNOSIS — R1310 Dysphagia, unspecified: Secondary | ICD-10-CM | POA: Diagnosis not present

## 2023-08-27 DIAGNOSIS — A4902 Methicillin resistant Staphylococcus aureus infection, unspecified site: Secondary | ICD-10-CM | POA: Diagnosis not present

## 2023-08-27 DIAGNOSIS — A419 Sepsis, unspecified organism: Secondary | ICD-10-CM | POA: Diagnosis not present

## 2023-08-27 DIAGNOSIS — G9341 Metabolic encephalopathy: Secondary | ICD-10-CM | POA: Diagnosis not present

## 2023-08-27 DIAGNOSIS — I059 Rheumatic mitral valve disease, unspecified: Secondary | ICD-10-CM | POA: Diagnosis not present

## 2023-08-27 DIAGNOSIS — I76 Septic arterial embolism: Secondary | ICD-10-CM | POA: Diagnosis not present

## 2023-08-27 DIAGNOSIS — I079 Rheumatic tricuspid valve disease, unspecified: Secondary | ICD-10-CM | POA: Diagnosis not present

## 2023-08-27 DIAGNOSIS — I63511 Cerebral infarction due to unspecified occlusion or stenosis of right middle cerebral artery: Secondary | ICD-10-CM | POA: Diagnosis not present

## 2023-08-27 DIAGNOSIS — J810 Acute pulmonary edema: Secondary | ICD-10-CM | POA: Diagnosis not present

## 2023-08-27 DIAGNOSIS — T80211A Bloodstream infection due to central venous catheter, initial encounter: Secondary | ICD-10-CM | POA: Diagnosis not present

## 2023-08-27 DIAGNOSIS — Z8673 Personal history of transient ischemic attack (TIA), and cerebral infarction without residual deficits: Secondary | ICD-10-CM | POA: Diagnosis not present

## 2023-08-27 DIAGNOSIS — J90 Pleural effusion, not elsewhere classified: Secondary | ICD-10-CM | POA: Diagnosis not present

## 2023-08-27 DIAGNOSIS — L89322 Pressure ulcer of left buttock, stage 2: Secondary | ICD-10-CM | POA: Diagnosis not present

## 2023-08-27 DIAGNOSIS — I50810 Right heart failure, unspecified: Secondary | ICD-10-CM | POA: Diagnosis not present

## 2023-08-27 DIAGNOSIS — I2510 Atherosclerotic heart disease of native coronary artery without angina pectoris: Secondary | ICD-10-CM | POA: Diagnosis not present

## 2023-08-27 DIAGNOSIS — I63411 Cerebral infarction due to embolism of right middle cerebral artery: Secondary | ICD-10-CM | POA: Diagnosis not present

## 2023-08-27 DIAGNOSIS — I330 Acute and subacute infective endocarditis: Secondary | ICD-10-CM | POA: Diagnosis not present

## 2023-08-27 DIAGNOSIS — D631 Anemia in chronic kidney disease: Secondary | ICD-10-CM | POA: Diagnosis not present

## 2023-08-27 DIAGNOSIS — I5020 Unspecified systolic (congestive) heart failure: Secondary | ICD-10-CM | POA: Diagnosis not present

## 2023-08-27 DIAGNOSIS — E1010 Type 1 diabetes mellitus with ketoacidosis without coma: Secondary | ICD-10-CM | POA: Diagnosis not present

## 2023-08-27 DIAGNOSIS — E875 Hyperkalemia: Secondary | ICD-10-CM | POA: Diagnosis not present

## 2023-08-27 DIAGNOSIS — G9389 Other specified disorders of brain: Secondary | ICD-10-CM | POA: Diagnosis not present

## 2023-08-27 DIAGNOSIS — E43 Unspecified severe protein-calorie malnutrition: Secondary | ICD-10-CM | POA: Diagnosis not present

## 2023-08-27 DIAGNOSIS — Z992 Dependence on renal dialysis: Secondary | ICD-10-CM | POA: Diagnosis not present

## 2023-08-27 DIAGNOSIS — E1065 Type 1 diabetes mellitus with hyperglycemia: Secondary | ICD-10-CM | POA: Diagnosis not present

## 2023-08-27 DIAGNOSIS — R7881 Bacteremia: Secondary | ICD-10-CM | POA: Diagnosis not present

## 2023-08-27 DIAGNOSIS — E1022 Type 1 diabetes mellitus with diabetic chronic kidney disease: Secondary | ICD-10-CM | POA: Diagnosis not present

## 2023-08-27 DIAGNOSIS — I70201 Unspecified atherosclerosis of native arteries of extremities, right leg: Secondary | ICD-10-CM | POA: Diagnosis not present

## 2023-08-27 DIAGNOSIS — S066X9A Traumatic subarachnoid hemorrhage with loss of consciousness of unspecified duration, initial encounter: Secondary | ICD-10-CM | POA: Diagnosis not present

## 2023-08-27 DIAGNOSIS — Z6834 Body mass index (BMI) 34.0-34.9, adult: Secondary | ICD-10-CM | POA: Diagnosis not present

## 2023-08-27 DIAGNOSIS — N2581 Secondary hyperparathyroidism of renal origin: Secondary | ICD-10-CM | POA: Diagnosis not present

## 2023-08-27 DIAGNOSIS — G8324 Monoplegia of upper limb affecting left nondominant side: Secondary | ICD-10-CM | POA: Diagnosis not present

## 2023-08-27 DIAGNOSIS — T383X5A Adverse effect of insulin and oral hypoglycemic [antidiabetic] drugs, initial encounter: Secondary | ICD-10-CM | POA: Diagnosis not present

## 2023-08-27 DIAGNOSIS — I96 Gangrene, not elsewhere classified: Secondary | ICD-10-CM | POA: Diagnosis not present

## 2023-08-27 DIAGNOSIS — E1143 Type 2 diabetes mellitus with diabetic autonomic (poly)neuropathy: Secondary | ICD-10-CM | POA: Diagnosis not present

## 2023-08-27 DIAGNOSIS — F329 Major depressive disorder, single episode, unspecified: Secondary | ICD-10-CM | POA: Diagnosis not present

## 2023-08-27 DIAGNOSIS — E1052 Type 1 diabetes mellitus with diabetic peripheral angiopathy with gangrene: Secondary | ICD-10-CM | POA: Diagnosis not present

## 2023-08-27 DIAGNOSIS — I700 Atherosclerosis of aorta: Secondary | ICD-10-CM | POA: Diagnosis not present

## 2023-08-27 DIAGNOSIS — I081 Rheumatic disorders of both mitral and tricuspid valves: Secondary | ICD-10-CM | POA: Diagnosis not present

## 2023-08-27 DIAGNOSIS — I82C12 Acute embolism and thrombosis of left internal jugular vein: Secondary | ICD-10-CM | POA: Diagnosis not present

## 2023-08-27 DIAGNOSIS — B9562 Methicillin resistant Staphylococcus aureus infection as the cause of diseases classified elsewhere: Secondary | ICD-10-CM | POA: Diagnosis not present

## 2023-08-27 DIAGNOSIS — E871 Hypo-osmolality and hyponatremia: Secondary | ICD-10-CM | POA: Diagnosis not present

## 2023-08-27 DIAGNOSIS — Z89612 Acquired absence of left leg above knee: Secondary | ICD-10-CM | POA: Diagnosis not present

## 2023-08-27 DIAGNOSIS — Y848 Other medical procedures as the cause of abnormal reaction of the patient, or of later complication, without mention of misadventure at the time of the procedure: Secondary | ICD-10-CM | POA: Diagnosis not present

## 2023-08-27 DIAGNOSIS — Z794 Long term (current) use of insulin: Secondary | ICD-10-CM | POA: Diagnosis not present

## 2023-08-27 DIAGNOSIS — Z0279 Encounter for issue of other medical certificate: Secondary | ICD-10-CM | POA: Diagnosis not present

## 2023-08-27 DIAGNOSIS — M86171 Other acute osteomyelitis, right ankle and foot: Secondary | ICD-10-CM | POA: Diagnosis not present

## 2023-08-27 DIAGNOSIS — Z89421 Acquired absence of other right toe(s): Secondary | ICD-10-CM | POA: Diagnosis not present

## 2023-08-27 DIAGNOSIS — R4189 Other symptoms and signs involving cognitive functions and awareness: Secondary | ICD-10-CM | POA: Diagnosis not present

## 2023-08-27 DIAGNOSIS — I2699 Other pulmonary embolism without acute cor pulmonale: Secondary | ICD-10-CM | POA: Diagnosis not present

## 2023-08-27 DIAGNOSIS — R531 Weakness: Secondary | ICD-10-CM | POA: Diagnosis not present

## 2023-08-27 DIAGNOSIS — I63443 Cerebral infarction due to embolism of bilateral cerebellar arteries: Secondary | ICD-10-CM | POA: Diagnosis not present

## 2023-08-27 DIAGNOSIS — I609 Nontraumatic subarachnoid hemorrhage, unspecified: Secondary | ICD-10-CM | POA: Diagnosis not present

## 2023-08-27 DIAGNOSIS — G060 Intracranial abscess and granuloma: Secondary | ICD-10-CM | POA: Diagnosis not present

## 2023-08-27 DIAGNOSIS — I5043 Acute on chronic combined systolic (congestive) and diastolic (congestive) heart failure: Secondary | ICD-10-CM | POA: Diagnosis not present

## 2023-08-27 DIAGNOSIS — I770 Arteriovenous fistula, acquired: Secondary | ICD-10-CM | POA: Diagnosis not present

## 2023-08-27 DIAGNOSIS — B9689 Other specified bacterial agents as the cause of diseases classified elsewhere: Secondary | ICD-10-CM | POA: Diagnosis not present

## 2023-08-27 DIAGNOSIS — I080 Rheumatic disorders of both mitral and aortic valves: Secondary | ICD-10-CM | POA: Diagnosis not present

## 2023-08-27 DIAGNOSIS — E11628 Type 2 diabetes mellitus with other skin complications: Secondary | ICD-10-CM | POA: Diagnosis not present

## 2023-08-27 DIAGNOSIS — E1043 Type 1 diabetes mellitus with diabetic autonomic (poly)neuropathy: Secondary | ICD-10-CM | POA: Diagnosis not present

## 2023-08-27 DIAGNOSIS — G936 Cerebral edema: Secondary | ICD-10-CM | POA: Diagnosis not present

## 2023-08-27 DIAGNOSIS — E1069 Type 1 diabetes mellitus with other specified complication: Secondary | ICD-10-CM | POA: Diagnosis not present

## 2023-08-27 DIAGNOSIS — I82B12 Acute embolism and thrombosis of left subclavian vein: Secondary | ICD-10-CM | POA: Diagnosis not present

## 2023-08-27 DIAGNOSIS — L089 Local infection of the skin and subcutaneous tissue, unspecified: Secondary | ICD-10-CM | POA: Diagnosis not present

## 2023-08-27 DIAGNOSIS — D638 Anemia in other chronic diseases classified elsewhere: Secondary | ICD-10-CM | POA: Diagnosis not present

## 2023-08-27 DIAGNOSIS — I38 Endocarditis, valve unspecified: Secondary | ICD-10-CM | POA: Diagnosis not present

## 2023-08-27 DIAGNOSIS — N412 Abscess of prostate: Secondary | ICD-10-CM | POA: Diagnosis not present

## 2023-08-27 DIAGNOSIS — I2690 Septic pulmonary embolism without acute cor pulmonale: Secondary | ICD-10-CM | POA: Diagnosis not present

## 2023-08-27 DIAGNOSIS — G0490 Encephalitis and encephalomyelitis, unspecified: Secondary | ICD-10-CM | POA: Diagnosis not present

## 2023-08-27 DIAGNOSIS — I132 Hypertensive heart and chronic kidney disease with heart failure and with stage 5 chronic kidney disease, or end stage renal disease: Secondary | ICD-10-CM | POA: Diagnosis not present

## 2023-08-27 DIAGNOSIS — N186 End stage renal disease: Secondary | ICD-10-CM | POA: Diagnosis not present

## 2023-08-27 LAB — RENAL FUNCTION PANEL
Albumin: 2.7 g/dL — ABNORMAL LOW (ref 3.5–5.0)
Anion gap: 16 — ABNORMAL HIGH (ref 5–15)
BUN: 114 mg/dL — ABNORMAL HIGH (ref 6–20)
CO2: 23 mmol/L (ref 22–32)
Calcium: 9.6 mg/dL (ref 8.9–10.3)
Chloride: 89 mmol/L — ABNORMAL LOW (ref 98–111)
Creatinine, Ser: 8.36 mg/dL — ABNORMAL HIGH (ref 0.61–1.24)
GFR, Estimated: 7 mL/min — ABNORMAL LOW (ref >60)
Glucose, Bld: 418 mg/dL — ABNORMAL HIGH (ref 70–99)
Phosphorus: 7.7 mg/dL — ABNORMAL HIGH (ref 2.5–4.6)
Potassium: 6.3 mmol/L (ref 3.5–5.1)
Sodium: 128 mmol/L — ABNORMAL LOW (ref 135–145)

## 2023-08-27 LAB — VANCOMYCIN, RANDOM: Vancomycin Rm: 18 ug/mL

## 2023-08-27 LAB — CBC
HCT: 29.8 % — ABNORMAL LOW (ref 39.0–52.0)
Hemoglobin: 9.5 g/dL — ABNORMAL LOW (ref 13.0–17.0)
MCH: 29.7 pg (ref 26.0–34.0)
MCHC: 31.9 g/dL (ref 30.0–36.0)
MCV: 93.1 fL (ref 80.0–100.0)
Platelets: 186 10*3/uL (ref 150–400)
RBC: 3.2 MIL/uL — ABNORMAL LOW (ref 4.22–5.81)
RDW: 15.8 % — ABNORMAL HIGH (ref 11.5–15.5)
WBC: 8 10*3/uL (ref 4.0–10.5)
nRBC: 0 % (ref 0.0–0.2)

## 2023-08-27 LAB — GLUCOSE, CAPILLARY
Glucose-Capillary: 67 mg/dL — ABNORMAL LOW (ref 70–99)
Glucose-Capillary: 65 mg/dL — ABNORMAL LOW (ref 70–99)
Glucose-Capillary: 158 mg/dL — ABNORMAL HIGH (ref 70–99)
Glucose-Capillary: 314 mg/dL — ABNORMAL HIGH (ref 70–99)

## 2023-08-27 MED ORDER — LIDOCAINE-PRILOCAINE 2.5-2.5 % EX CREA: 1.0000 | TOPICAL_CREAM | CUTANEOUS | Status: DC | PRN

## 2023-08-27 MED ORDER — JUVEN PO PACK: 1.0000 | PACK | Freq: Two times a day (BID) | ORAL | Status: DC

## 2023-08-27 MED ORDER — HEPARIN SODIUM (PORCINE) 1000 UNIT/ML DIALYSIS: 1000.0000 [IU] | INTRAMUSCULAR | Status: DC | PRN

## 2023-08-27 MED ORDER — ANTICOAGULANT SODIUM CITRATE 4% (200MG/5ML) IV SOLN: 5.0000 mL | Status: DC | PRN

## 2023-08-27 MED ORDER — NEPRO/CARBSTEADY PO LIQD: 237.0000 mL | ORAL | Status: DC | PRN

## 2023-08-27 MED ORDER — LINEZOLID 600 MG PO TABS: 600.0000 mg | ORAL_TABLET | Freq: Two times a day (BID) | ORAL | Status: DC

## 2023-08-27 MED ORDER — INSULIN ASPART 100 UNIT/ML IJ SOLN: 0.0000 [IU] | Freq: Three times a day (TID) | INTRAMUSCULAR | Status: DC

## 2023-08-27 MED ORDER — METOPROLOL TARTRATE 50 MG PO TABS: 50.0000 mg | ORAL_TABLET | Freq: Two times a day (BID) | ORAL | Status: DC

## 2023-08-27 MED ORDER — VANCOMYCIN HCL 500 MG/100ML IV SOLN: 500.0000 mg | INTRAVENOUS | Status: DC

## 2023-08-27 MED ORDER — LIDOCAINE HCL (PF) 1 % IJ SOLN: 5.0000 mL | INTRAMUSCULAR | Status: DC | PRN

## 2023-08-27 MED ORDER — INSULIN ASPART 100 UNIT/ML IJ SOLN: 0.0000 [IU] | Freq: Every day | INTRAMUSCULAR | Status: DC

## 2023-08-27 MED ORDER — APIXABAN 5 MG PO TABS: 5.0000 mg | ORAL_TABLET | Freq: Two times a day (BID) | ORAL | Status: DC

## 2023-08-27 MED ORDER — ALTEPLASE 2 MG IJ SOLR: 2.0000 mg | Freq: Once | INTRAMUSCULAR | Status: DC | PRN

## 2023-08-27 MED ORDER — PENTAFLUOROPROP-TETRAFLUOROETH EX AERO: 1.0000 | INHALATION_SPRAY | CUTANEOUS | Status: DC | PRN

## 2023-08-27 MED ORDER — INSULIN ASPART 100 UNIT/ML IJ SOLN
0.0000 [IU] | Freq: Every day | INTRAMUSCULAR | Status: DC
Start: 2023-08-27 — End: 2023-08-28

## 2023-08-27 MED ORDER — ACETAMINOPHEN 325 MG PO TABS: 650.0000 mg | ORAL_TABLET | Freq: Four times a day (QID) | ORAL | Status: DC | PRN

## 2023-08-27 MED ORDER — ALBUTEROL SULFATE (2.5 MG/3ML) 0.083% IN NEBU: 2.5000 mg | INHALATION_SOLUTION | Freq: Four times a day (QID) | RESPIRATORY_TRACT | 12 refills | Status: DC | PRN

## 2023-08-27 NOTE — Progress Notes (Signed)
[facility]  Date of Admission:  07/09/2023   Principal Problem:   Endocarditis of mitral valve Active Problems:   Insulin dependent type 1 diabetes mellitus ([name])   Diabetic gastroparesis ([name])   Septic shock ([name])   Anemia of chronic disease   Iron deficiency anemia, unspecified   Secondary hyperparathyroidism of renal origin ([name])   Thrombocytopenia ([name])   Acute metabolic encephalopathy   MRSA bacteremia   Right elbow pain   ESRD on hemodialysis ([name])   Acute systolic CHF (congestive heart failure) ([name])   DCM (dilated cardiomyopathy) ([name])   Coronary artery calcification seen on CAT scan   Infective endocarditis of tricuspid valve   Protein-calorie malnutrition, severe   Cavitary pneumonia   Perforation of leaflet of mitral valve   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)   Hemiparesis affecting left side as late effect of stroke ([name])   Dysphagia due to recent stroke   Cardiogenic shock ([name])   Acute blood loss anemia   Hx of AKA (above knee amputation), left ([name])   Subclavian vein occlusion, bilateral ([name])   Fatty liver   Liver mass   Physical deconditioning   Cerebrovascular accident (CVA) due to embolism of cerebral artery ([name])          Assessment: [name]  with DM, left AKA intubated with: # Disseminated MRSA infection with MRSA bacteremia with native mitral valve/possible TV endocarditis, right MCA stroke and abscess, LUL cavitation - TEE on 10/22 showed a large vegetation measuring over 3.9 cm X 1 cm on mitral leaflet, aortic valve -Patient has been on IV vancomycin for above. - Patient developed worsening mental status on 11/22 CT demonstrated worsening right MCA territory infarct and petechial hemorrhage and subarachnoid hemorrhage.  Neurology recommended MRI brain which showed large necrotic urine lesion in the right frontal lobe with an area of right MCA infarct with new vasogenic edema given  endocarditis history concerning for abscess. - Neurosurgery was engaged and commented that they did not see a focal abscess for which recommend any type of surgical intervention.  Recommended infectious disease involvement. Recommendations: - Continue vancomycin with HD. -- Added linezolid for CNS coverage x 2 weeks EOT 12/10. Recc repeat MRA brain in a couple weeks.Given that MRA is concerning for an abscess, antibiotics alone unlikely to be curative. Pt had a very large vegetation on mitral valve this could be septic emboli as well. follow epeat blood cultures - Of note patient has a femoral line x 42 days for HD --Pt is eating breakfast in bed, risk of aspiration if not siting up while eating  Microbiology:   Antibiotics: Vancomycin Linezolid 11/27-  SUBJECTIVE: Eating breakfast in bed Interval: Afebrile overnight.  [facility]  Review of Systems: Review of Systems  All other systems reviewed and are negative.    Scheduled Meds:  apixaban  5 mg Oral BID   calcitRIOL  1.5 mcg Oral Q M,W,F   Chlorhexidine Gluconate Cloth  6 each Topical Daily   darbepoetin (ARANESP) injection - DIALYSIS  100 mcg Subcutaneous Q Fri-1800   feeding supplement (PROSource TF20)  60 mL Per Tube Daily   insulin aspart  0-5 Units Subcutaneous QHS   insulin aspart  0-9 Units Subcutaneous TID WC   linezolid  600 mg Oral Q12H   metoprolol tartrate  50 mg Oral BID   multivitamin  1 tablet Oral QHS   nutrition supplement (JUVEN)  1 packet Oral  BID BM   mouth rinse  15 mL Mouth Rinse 4 times per day   Continuous Infusions:  vancomycin     PRN Meds:.acetaminophen, albuterol, Gerhardt's butt cream, heparin sodium (porcine), lip balm, loperamide HCl, mouth rinse No Known Allergies  OBJECTIVE: Vitals:   08/27/23 1715 08/27/23 1730 08/27/23 1826 08/27/23 1931  BP: 137/79 114/81 134/84 100/70  Pulse:  (!) 111 (!) 111 (!) 113  Resp: 20 16 17 17   Temp:   98 F (36.7 C) 98.1 F (36.7 C)  TempSrc:   Oral Oral   SpO2:  97% 99% 100%  Weight:      Height:       Body mass index is 26.94 kg/m.  Physical Exam Constitutional:      General: He is not in acute distress.    Appearance: He is normal weight. He is not toxic-appearing.  HENT:     Head: Normocephalic and atraumatic.     Right Ear: External ear normal.     Left Ear: External ear normal.     Nose: No congestion or rhinorrhea.     Mouth/Throat:     Mouth: Mucous membranes are moist.     Pharynx: Oropharynx is clear.  Eyes:     Extraocular Movements: Extraocular movements intact.     Conjunctiva/sclera: Conjunctivae normal.     Pupils: Pupils are equal, round, and reactive to light.  Cardiovascular:     Rate and Rhythm: Normal rate and regular rhythm.     Heart sounds: No murmur heard.    No friction rub. No gallop.  Pulmonary:     Effort: Pulmonary effort is normal.     Breath sounds: Normal breath sounds.  Abdominal:     General: Abdomen is flat. Bowel sounds are normal.     Palpations: Abdomen is soft.  Musculoskeletal:        General: No swelling. Normal range of motion.     Cervical back: Normal range of motion and neck supple.  Skin:    General: Skin is warm and dry.  Neurological:     General: No focal deficit present.     Mental Status: He is oriented to person, place, and time.  Psychiatric:        Mood and Affect: Mood normal.       Lab Results Lab Results  Component Value Date   WBC 8.0 08/27/2023   HGB 9.5 (L) 08/27/2023   HCT 29.8 (L) 08/27/2023   MCV 93.1 08/27/2023   PLT 186 08/27/2023    Lab Results  Component Value Date   CREATININE 8.36 (H) 08/27/2023   BUN 114 (H) 08/27/2023   NA 128 (L) 08/27/2023   K 6.3 (HH) 08/27/2023   CL 89 (L) 08/27/2023   CO2 23 08/27/2023    Lab Results  Component Value Date   ALT 10 08/17/2023   AST 37 08/17/2023   ALKPHOS 136 (H) 08/17/2023   BILITOT 0.9 08/17/2023        Danelle Earthly, MD [facility] 08/27/2023, 7:39 PM I have personally spent 51 minutes involved in face-to-face and non-face-to-face activities for this patient on the day of the visit. Professional time spent includes the following activities: Preparing to see the patient (review of tests), Obtaining and/or reviewing separately obtained history (admission/discharge record), Performing a medically appropriate examination and/or evaluation , Ordering medications/tests/procedures, referring and communicating with other health care professionals, Documenting clinical information in the EMR, Independently interpreting  results (not separately reported), Communicating results to the patient/family/caregiver, Counseling and educating the patient/family/caregiver and Care coordination (not separately reported).

## 2023-08-27 NOTE — Progress Notes (Signed)
Pharmacy Antibiotic Note  [name] is a 42 y.o. 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 Pharmacy has been consulted for Vancomycin dosing.   Vancomycin random pre-HD 18 within goal range (15-25).   Plan: - Vancomycin 500 mg IV post HD MWF  - Will monitor HD tolerance and dosing - Plan weekly levels on Friday with note  - Tentative stop date: 09/19/23 - entered  Height: 5\' 7"  (170.2 cm) Weight:  (No wt due to special bed.) IBW/kg (Calculated) : 66.1  Temp (24hrs), Avg:97.8 F (36.6 C), Min:97.4 F (36.3 C), Max:98.2 F (36.8 C)  Recent Labs  Lab 08/20/23 1751 08/21/23 1556 08/21/23 1755 08/23/23 1418 08/25/23 1706 08/27/23 1436  WBC  --  21.8* 21.1* 9.9 8.8 8.0  CREATININE 6.70*  --   --  6.60* 6.21*  --   VANCORANDOM  --   --   --   --   --  18    Estimated Creatinine Clearance: 14.3 mL/min (A) (by C-G formula based on SCr of 6.21 mg/dL (H)).    No Known Allergies  Antimicrobials this admission: Vancomycin 10/12 >>(12/22) Cefepime 10/21 >> 10/23; restart 10/25 >> 10/29 Linezolid 11/27 >> (12/10)  Vancomycin levels  -10/17 preHD>>25 -10/24: VR= 16 -11/1: VR= 24 -11/8 VR 22 -11/15 VR 22  -11/22 VR 28 - held dose x1, decrease to 500mg  -11/28 VR 18 - cont 500mg  MWF  Microbiology results: 10/12 MRSA PCR >> positive 10/12 CDiff antigen +, toxin -, PCR - >> negative 10/12 BCx >> 4/4 MRSA 10/13 BCx >> NGf 10/17 BCx >> NGf 10/21 RCx >> NOF 11/26 Bcx >> ngtd x2d 11/27 Bcx >> NG < 12hrs   Thank you for allowing pharmacy to be a part of this patient's care.  Loralee Pacas, PharmD, BCPS PGY1 Pharmacy Resident  Please check AMION for all [facility] Pharmacy phone numbers After 10:00 PM, call Main Pharmacy [phone]

## 2023-08-27 NOTE — Plan of Care (Signed)
  Problem: Self-Care: Goal: Ability to participate in self-care as condition permits will improve Outcome: Progressing Goal: Verbalization of feelings and concerns over difficulty with self-care will improve Outcome: Progressing Goal: Ability to communicate needs accurately will improve Outcome: Progressing   Problem: Nutrition: Goal: Risk of aspiration will decrease Outcome: Progressing Goal: Dietary intake will improve Outcome: Progressing   Problem: Education: Goal: Knowledge of disease or condition will improve Outcome: Progressing Goal: Knowledge of secondary prevention will improve (MUST DOCUMENT ALL) Outcome: Progressing Goal: Knowledge of patient specific risk factors will improve Loraine Leriche N/A or DELETE if not current risk factor) Outcome: Progressing   Problem: Ischemic Stroke/TIA Tissue Perfusion: Goal: Complications of ischemic stroke/TIA will be minimized Outcome: Progressing   Problem: Coping: Goal: Will verbalize positive feelings about self Outcome: Progressing Goal: Will identify appropriate support needs Outcome: Progressing   Problem: Health Behavior/Discharge Planning: Goal: Ability to manage health-related needs will improve Outcome: Progressing Goal: Goals will be collaboratively established with patient/family Outcome: Progressing   Problem: Self-Care: Goal: Ability to participate in self-care as condition permits will improve Outcome: Progressing Goal: Verbalization of feelings and concerns over difficulty with self-care will improve Outcome: Progressing Goal: Ability to communicate needs accurately will improve Outcome: Progressing   Problem: Nutrition: Goal: Risk of aspiration will decrease Outcome: Progressing Goal: Dietary intake will improve Outcome: Progressing   Problem: Safety: Goal: Non-violent Restraint(s) Outcome: Progressing   Problem: Education: Goal: Knowledge of General Education information will improve Description:  Including pain rating scale, medication(s)/side effects and non-pharmacologic comfort measures Outcome: Progressing   Problem: Health Behavior/Discharge Planning: Goal: Ability to manage health-related needs will improve Outcome: Progressing   Problem: Clinical Measurements: Goal: Ability to maintain clinical measurements within normal limits will improve Outcome: Progressing Goal: Will remain free from infection Outcome: Progressing Goal: Diagnostic test results will improve Outcome: Progressing Goal: Respiratory complications will improve Outcome: Progressing Goal: Cardiovascular complication will be avoided Outcome: Progressing   Problem: Activity: Goal: Risk for activity intolerance will decrease Outcome: Progressing   Problem: Nutrition: Goal: Adequate nutrition will be maintained  Problem: Coping: Goal: Level of anxiety will decrease Outcome: Progressing   Problem: Elimination: Goal: Will not experience complications related to bowel motility Outcome: Progressing Goal: Will not experience complications related to urinary retention Outcome: Progressing   Problem: Pain Management: Goal: General experience of comfort will improve Outcome: Progressing   Problem: Safety: Goal: Ability to remain free from injury will improve Outcome: Progressing   Problem: Skin Integrity: Goal: Risk for impaired skin integrity will decrease Outcome: Progressing

## 2023-08-27 NOTE — Progress Notes (Signed)
OT Cancellation Note  Patient Details Name: [name] MRN: [id number] DOB: August 09, 1981   Cancelled Treatment:    Reason Eval/Treat Not Completed: Patient at procedure or test/ unavailable. PT in hemodialysis.  [name] OT Acute Rehabilitation Services Office [phone]    [name] 08/27/2023, 2:53 PM

## 2023-08-27 NOTE — Plan of Care (Signed)
Problem: Self-Care: Goal: Ability to participate in self-care as condition permits will improve 08/27/2023 0547 by Joylene John, RN Outcome: Progressing 08/26/2023 1936 by Joylene John, RN Outcome: Progressing Goal: Verbalization of feelings and concerns over difficulty with self-care will improve 08/27/2023 0547 by Joylene John, RN Outcome: Progressing 08/26/2023 1936 by Joylene John, RN Outcome: Progressing Goal: Ability to communicate needs accurately will improve 08/27/2023 0547 by Joylene John, RN Outcome: Progressing 08/26/2023 1936 by Joylene John, RN Outcome: Progressing   Problem: Nutrition: Goal: Risk of aspiration will decrease 08/27/2023 0547 by Joylene John, RN Outcome: Progressing 08/26/2023 1936 by Joylene John, RN Outcome: Progressing Goal: Dietary intake will improve 08/27/2023 0547 by Joylene John, RN Outcome: Progressing 08/26/2023 1936 by Joylene John, RN Outcome: Progressing   Problem: Education: Goal: Knowledge of disease or condition will improve 08/27/2023 0547 by Joylene John, RN Outcome: Progressing 08/26/2023 1936 by Joylene John, RN Outcome: Progressing Goal: Knowledge of secondary prevention will improve (MUST DOCUMENT ALL) 08/27/2023 0547 by Joylene John, RN Outcome: Progressing 08/26/2023 1936 by Joylene John, RN Outcome: Progressing Goal: Knowledge of patient specific risk factors will improve Loraine Leriche N/A or DELETE if not current risk factor) 08/27/2023 0547 by Joylene John, RN Outcome: Progressing 08/26/2023 1936 by Joylene John, RN Outcome: Progressing   Problem: Ischemic Stroke/TIA Tissue Perfusion: Goal: Complications of ischemic stroke/TIA will be minimized 08/27/2023 0547 by Joylene John, RN Outcome: Progressing 08/26/2023 1936 by Joylene John, RN Outcome: Progressing   Problem: Coping: Goal: Will verbalize positive  feelings about self 08/27/2023 0547 by Joylene John, RN Outcome: Progressing 08/26/2023 1936 by Joylene John, RN Outcome: Progressing Goal: Will identify appropriate support needs 08/27/2023 0547 by Joylene John, RN Outcome: Progressing 08/26/2023 1936 by Joylene John, RN Outcome: Progressing   Problem: Health Behavior/Discharge Planning: Goal: Ability to manage health-related needs will improve 08/27/2023 0547 by Joylene John, RN Outcome: Progressing 08/26/2023 1936 by Joylene John, RN Outcome: Progressing Goal: Goals will be collaboratively established with patient/family 08/27/2023 0547 by Joylene John, RN Outcome: Progressing 08/26/2023 1936 by Joylene John, RN Outcome: Progressing   Problem: Self-Care: Goal: Ability to participate in self-care as condition permits will improve 08/27/2023 0547 by Joylene John, RN Outcome: Progressing 08/26/2023 1936 by Joylene John, RN Outcome: Progressing Goal: Verbalization of feelings and concerns over difficulty with self-care will improve 08/27/2023 0547 by Joylene John, RN Outcome: Progressing 08/26/2023 1936 by Joylene John, RN Outcome: Progressing Goal: Ability to communicate needs accurately will improve 08/27/2023 0547 by Joylene John, RN Outcome: Progressing 08/26/2023 1936 by Joylene John, RN Outcome: Progressing   Problem: Nutrition: Goal: Risk of aspiration will decrease 08/27/2023 0547 by Joylene John, RN Outcome: Progressing 08/26/2023 1936 by Joylene John, RN Outcome: Progressing Goal: Dietary intake will improve 08/27/2023 0547 by Joylene John, RN Outcome: Progressing 08/26/2023 1936 by Joylene John, RN Outcome: Progressing   Problem: Safety: Goal: Non-violent Restraint(s) 08/27/2023 0547 by Joylene John, RN Outcome: Progressing 08/26/2023 1936 by Joylene John, RN Outcome: Progressing    Problem: Education: Goal: Knowledge of General Education information will improve Description: Including pain rating scale, medication(s)/side effects and non-pharmacologic comfort measures 08/27/2023 0547 by Joylene John, RN Outcome: Progressing 08/26/2023 1936 by Joylene John, RN Outcome: Progressing   Problem: Health Behavior/Discharge Planning: Goal: Ability to manage health-related needs will improve 08/27/2023 0547  by Joylene John, RN Outcome: Progressing 08/26/2023 1936 by Joylene John, RN Outcome: Progressing   Problem: Clinical Measurements: Goal: Ability to maintain clinical measurements within normal limits will improve 08/27/2023 0547 by Joylene John, RN Outcome: Progressing 08/26/2023 1936 by Joylene John, RN Outcome: Progressing Goal: Will remain free from infection 08/27/2023 0547 by Joylene John, RN Outcome: Progressing 08/26/2023 1936 by Joylene John, RN Outcome: Progressing Goal: Diagnostic test results will improve 08/27/2023 0547 by Joylene John, RN Outcome: Progressing 08/26/2023 1936 by Joylene John, RN Outcome: Progressing Goal: Respiratory complications will improve 08/27/2023 0547 by Joylene John, RN Outcome: Progressing 08/26/2023 1936 by Joylene John, RN Outcome: Progressing Goal: Cardiovascular complication will be avoided 08/27/2023 0547 by Joylene John, RN Outcome: Progressing 08/26/2023 1936 by Joylene John, RN Outcome: Progressing   Problem: Activity: Goal: Risk for activity intolerance will decrease 08/27/2023 0547 by Joylene John, RN Outcome: Progressing 08/26/2023 1936 by Joylene John, RN Outcome: Progressing   Problem: Nutrition: Goal: Adequate nutrition will be maintained 08/27/2023 0547 by Joylene John, RN Outcome: Progressing 08/26/2023 1936 by Joylene John, RN Outcome: Progressing   Problem: Coping: Goal: Level  of anxiety will decrease 08/27/2023 0547 by Joylene John, RN Outcome: Progressing 08/26/2023 1936 by Joylene John, RN Outcome: Progressing   Problem: Elimination: Goal: Will not experience complications related to bowel motility 08/27/2023 0547 by Joylene John, RN Outcome: Progressing 08/26/2023 1936 by Joylene John, RN Outcome: Progressing Goal: Will not experience complications related to urinary retention 08/27/2023 0547 by Joylene John, RN Outcome: Progressing 08/26/2023 1936 by Joylene John, RN Outcome: Progressing   Problem: Pain Management: Goal: General experience of comfort will improve 08/27/2023 0547 by Joylene John, RN Outcome: Progressing 08/26/2023 1936 by Joylene John, RN Outcome: Progressing   Problem: Safety: Goal: Ability to remain free from injury will improve 08/27/2023 0547 by Joylene John, RN Outcome: Progressing 08/26/2023 1936 by Joylene John, RN Outcome: Progressing   Problem: Skin Integrity: Goal: Risk for impaired skin integrity will decrease 08/27/2023 0547 by Joylene John, RN Outcome: Progressing 08/26/2023 1936 by Joylene John, RN Outcome: Progressing

## 2023-08-27 NOTE — Care Plan (Addendum)
Detailed communication with patient's mother Ms. Alexis Goodell along with her daughter on the phone.  Patient with some clinical improvement now and updated about clinical status.  Recommended readiness for discharge or transfer.  Family requested patient to be transferred to [facility] for mitral valve surgery that was recommended earlier but patient was not stable enough to be transferred.  On family request, I have initiated a request to transfer to [facility].  Waiting for callback from accepting physician.  Patient has been accepted to transfer to [facility]. Will prepare discharge.

## 2023-08-27 NOTE — Progress Notes (Signed)
Given report to ground transport, [facility]. States they will arrive in 35-40 mins. Updated family and pt.

## 2023-08-27 NOTE — Progress Notes (Signed)
[facility] Progress Note   Subjective: Seen and examined at bedside. No new events.  Asking when he's going home. Dialysis today.   Objective Vitals:   08/26/23 2040 08/26/23 2355 08/27/23 0317 08/27/23 0737  BP: (!) 161/52 (!) 140/84 (!) 164/118 (!) 197/124  Pulse: 90 89 89 98  Resp: 18 18 18    Temp: (!) 97.5 F (36.4 C) 98.1 F (36.7 C) (!) 97.4 F (36.3 C)   TempSrc: Axillary Oral Oral   SpO2: 100% 100% 100%   Weight:      Height:       Physical Exam General: Chronically ill appearing, nad Heart: RRR  Lungs: Clear bilaterally  Abdomen: Soft non -tender  Extremities: L AKA no stump edema.RUE edema  Dialysis Access: L Femoral [facility]     Additional Objective Labs: Basic Metabolic Panel: Recent Labs  Lab 08/20/23 1751 08/22/23 1713 08/23/23 1418 08/25/23 1706  NA 131*  --  131* 126*  K 5.0  --  4.7 5.1  CL 94*  --  91* 89*  CO2 23  --  22 23  GLUCOSE 114* 406* 170* 290*  BUN 94*  --  98* 81*  CREATININE 6.70*  --  6.60* 6.21*  CALCIUM 8.9  --  9.3 9.4  PHOS 4.6  --   --  4.9*   Liver Function Tests: Recent Labs  Lab 08/20/23 1751 08/25/23 1706  ALBUMIN 2.4* 2.8*   No results for input(s): "LIPASE", "AMYLASE" in the last 168 hours. CBC: Recent Labs  Lab 08/21/23 1556 08/21/23 1755 08/23/23 1418 08/25/23 1706  WBC 21.8* 21.1* 9.9 8.8  HGB 9.9* 8.8* 9.7* 9.8*  HCT 31.3* 27.9* 31.8* 31.2*  MCV 92.1 92.7 92.7 91.2  PLT 159 186 206 176   Blood Culture    Component Value Date/Time   SDES BLOOD SITE NOT SPECIFIED 08/25/2023 1706   SPECREQUEST  08/25/2023 1706    BOTTLES DRAWN AEROBIC ONLY Blood Culture results may not be optimal due to an inadequate volume of blood received in culture bottles   CULT  08/25/2023 1706    NO GROWTH < 24 HOURS Performed at [facility], [address] [place], [name] [address]    REPTSTATUS PENDING 08/25/2023 1706    Cardiac Enzymes: No results for input(s): "CKTOTAL", "CKMB", "CKMBINDEX",  "TROPONINI" in the last 168 hours. CBG: Recent Labs  Lab 08/26/23 2134 08/26/23 2355 08/27/23 0636 08/27/23 0732 08/27/23 0842  GLUCAP 227* 170* 67* 65* 158*   Iron Studies: No results for input(s): "IRON", "TIBC", "TRANSFERRIN", "FERRITIN" in the last 72 hours. @lablastinr3 @ Studies/Results: No results found. Medications:  vancomycin      apixaban  5 mg Oral BID   calcitRIOL  1.5 mcg Oral Q M,W,F   Chlorhexidine Gluconate Cloth  6 each Topical Daily   darbepoetin (ARANESP) injection - DIALYSIS  100 mcg Subcutaneous Q Fri-1800   feeding supplement (PROSource TF20)  60 mL Per Tube Daily   linezolid  600 mg Oral Q12H   metoprolol tartrate  50 mg Oral BID   midodrine  10 mg Oral Q M,W,F-HD   multivitamin  1 tablet Oral QHS   nutrition supplement (JUVEN)  1 packet Oral BID BM   mouth rinse  15 mL Mouth Rinse 4 times per day     OP HD: MWF [facility] 4h  400/1.5    77kg   2/3 bath  [facility] TDC     - Heparin none - last OP HD 10/7, post  wt 74.6kg - misses HD once every other week approx - rocaltrol 1.50 mcg    Assessment/ Plan:   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 Not a candidate for surgery; CT surgery recommended management w/ antibiotics. SP line holiday w/ new TDC placed 10/18 by IR.  Needs IV vanc for 6 wks thru 09/19/23.  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 Repeat CT 11/22 shows extension of R MCA territory now involving large portion of right frontal and parietal lobes as well as right basal ganglia with petechial hemorrhage and possible small volume subarachnoid hemorrhage. No surgical intervention per neurosurgery.  ESRD: on HD MWF. SP CRRT 10/22- 10/30, back on iHD now. Next HD 08/27/2023 Volume: Remains on RA. No gross volume overload.  New dry wt around 74-75 kg .  Anemia: Hb 9s stable. ESA dose increased to 100 mcg q. weekly. S/P 5 units PRBCs since admission.  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 On calcitriol for PTH control. Nutrition: Coretrak removed. On regular diet now. Monitor  Hypertension: Initially with hypotension and required pressers. Now hypertensive - follow after HD today. Stop midodrine.  HD access:  L  femoral TDC placed on 10/18. 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 Dispo: he successfully dialyzed in a chair 11/18. Will not need to do this again unless there is some clinical change. Ok for dc to SNF.   Shane Blase PA-C [facility] 08/27/2023,9:44 AM

## 2023-08-27 NOTE — Progress Notes (Addendum)
PROGRESS NOTE    JENSON BURNARD  WUJ:[id number] DOB: 12-23-1980 DOA: 07/09/2023 PCP: [name], NP    Brief Narrative:  42 year old gentleman with history of ESRD on hemodialysis, insulin-dependent type 1 diabetes, gastroparesis admitted since 10/11 where he presented with confusion, encephalopathy,  was agitation and blood glucose 500.  Complicated hospital stay.  Sepsis.  MRSA bacteremia.  ICU admission.  Significant hospital events as below.  10/11, admitted with altered mental status and DKA.  Blood cultures positive for MRSA.  TEE with ejection fraction 30 to 35%, mitral and tricuspid valve endocarditis.  Seen by cardiothoracic surgery, felt to be poor candidate.  ID recommended 6 weeks of vancomycin. 10/17, found to have acute right MCA stroke, left occipital and bilateral cerebellum consistent with septic emboli.  Neuroconsulted. 10/18, tunneled left femoral vascular catheter placed after line holiday.  Patient developed worsening lethargy and confusion, intubated overnight and transferred to ICU. 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  On increasing vasopressor requirement.  Extubated on 10/24. Failed swallow evaluation, multiple blood transfusions, hypoglycemic events.  On CRRT in ICU. 11/21-22, poor oral intake.  Repeat stroke workup for altered mental status showed worsening MCA territory infarct with petechial hemorrhage. 11/25, MRI questionable abscess or cerebritis.  Neurosurgery does not recommend any intervention.  ID recommended to give with Zyvox for 2 weeks on top of vancomycin that he is already receiving.  In very poor clinical status, however now improving.  Now eating regular diet.  NG tube discontinued.  Subjective:  Patient seen and examined.  He ate all his breakfast.  Denies any complaints.  Denies any swallowing difficulty.  He is more alert and communicative today.  Patient insist on going home.  No  family at the bedside.     Assessment & Plan:   Acute right MCA, expanding with new midline shift 08/20/2023. Acute metabolic encephalopathy with delirium and agitation, resolving. Currently mental status is improving.  He is off all the sedation and narcotics.  Suspected abscess or cerebritis.  Antibiotics recommendation as below.  Dysphagia, severe protein calorie malnutrition: Multiple episodes of encephalopathy and poor oral intake. Currently consuming most of the meal.  Core track was discontinued. Hopefully he can go back on regular diet soon.  Septic shock, mitral and tricuspid valve endocarditis, MRSA bacteremia: Resolved.  On midodrine.  Not a surgical candidate. Vancomycin with hemodialysis until 09/19/2023 Linezolid for 2 weeks starting 11/26  Acute on chronic combined systolic and diastolic heart failure: Now on dialysis.  Continue metoprolol.  Euvolemic.  Lower extremity DVT: Therapeutic on Eliquis  ESRD on hemodialysis: On his schedule of dialysis.  He had line holiday and then a tunneled catheter was placed on the left femoral area on 10/18.  Receiving dialysis. Anemia thrombocytopenia due to critical illness: Total 5 units of PRBC.  Pressure Injury 08/03/23 Buttocks Left Stage 2 -  Partial thickness loss of dermis presenting as a shallow open injury with a red, pink wound bed without slough. Several small open areas, right buttocks area (Active)  08/03/23 1945  Location: Buttocks  Location Orientation: Left  Staging: Stage 2 -  Partial thickness loss of dermis presenting as a shallow open injury with a red, pink wound bed without slough.  Wound Description (Comments): Several small open areas, right buttocks area  Present on Admission: No (present on transfer to [facility])     Type 2 diabetes, hypoglycemic episodes.  Some of the peripheral Accu-Cheks are not correct.  Will  verify with serum blood glucose for any peripheral Accu-Cheks less than 60 before correcting it.   Encourage regular diet and nutritional intake.   Patient is stable to transfer to a SNF.    DVT prophylaxis: Place and maintain sequential compression device Start: 07/30/23 1415 apixaban (ELIQUIS) tablet 5 mg   Code Status: Full code Family Communication: None at the bedside, mother unable to take the phone. Disposition Plan: Status is: Inpatient Remains inpatient appropriate because: IV antibiotics, can go to a SNF.     Consultants:  Neurology Nephrology Palliative care CT surgery  Procedures:  Multiple procedures as above  Antimicrobials:  Vancomycin and linezolid     Objective: Vitals:   08/26/23 2355 08/27/23 0317 08/27/23 0737 08/27/23 1155  BP: (!) 140/84 (!) 164/118 (!) 197/124 (!) 158/92  Pulse: 89 89 98 (!) 108  Resp: 18 18  17   Temp: 98.1 F (36.7 C) (!) 97.4 F (36.3 C)  98.2 F (36.8 C)  TempSrc: Oral Oral  Oral  SpO2: 100% 100%  100%  Weight:      Height:        Intake/Output Summary (Last 24 hours) at 08/27/2023 1252 Last data filed at 08/27/2023 0730 Gross per 24 hour  Intake 500 ml  Output --  Net 500 ml   Filed Weights   08/21/23 0500 08/24/23 1328 08/24/23 1715  Weight: 75.3 kg 78 kg 78 kg    Examination:  General exam: Appears calm and comfortable.  On room air.  Talkative. Alert awake and mostly oriented. Respiratory system: No added sounds. Cardiovascular system: S1 & S2 heard, RRR.  Gastrointestinal system: Soft.  Nontender. Central nervous system: Left below-knee amputation. Chronic left hemiplegia.     Data Reviewed: I have personally reviewed following labs and imaging studies  CBC: Recent Labs  Lab 08/21/23 1556 08/21/23 1755 08/23/23 1418 08/25/23 1706  WBC 21.8* 21.1* 9.9 8.8  HGB 9.9* 8.8* 9.7* 9.8*  HCT 31.3* 27.9* 31.8* 31.2*  MCV 92.1 92.7 92.7 91.2  PLT 159 186 206 176   Basic Metabolic Panel: Recent Labs  Lab 08/20/23 1751 08/21/23 1556 08/22/23 0426 08/22/23 1713 08/23/23 1418  08/24/23 0430 08/25/23 1706  NA 131*  --   --   --  131*  --  126*  K 5.0  --   --   --  4.7  --  5.1  CL 94*  --   --   --  91*  --  89*  CO2 23  --   --   --  22  --  23  GLUCOSE 114*  --   --  406* 170*  --  290*  BUN 94*  --   --   --  98*  --  81*  CREATININE 6.70*  --   --   --  6.60*  --  6.21*  CALCIUM 8.9  --   --   --  9.3  --  9.4  MG  --  2.8* 2.6*  --   --  3.1*  --   PHOS 4.6  --   --   --   --   --  4.9*   GFR: Estimated Creatinine Clearance: 14.3 mL/min (A) (by C-G formula based on SCr of 6.21 mg/dL (H)). Liver Function Tests: Recent Labs  Lab 08/20/23 1751 08/25/23 1706  ALBUMIN 2.4* 2.8*   No results for input(s): "LIPASE", "AMYLASE" in the last 168 hours. No results for input(s): "AMMONIA" in the last 168  hours. Coagulation Profile: No results for input(s): "INR", "PROTIME" in the last 168 hours. Cardiac Enzymes: No results for input(s): "CKTOTAL", "CKMB", "CKMBINDEX", "TROPONINI" in the last 168 hours. BNP (last 3 results) No results for input(s): "PROBNP" in the last 8760 hours. HbA1C: No results for input(s): "HGBA1C" in the last 72 hours. CBG: Recent Labs  Lab 08/26/23 2355 08/27/23 0636 08/27/23 0732 08/27/23 0842 08/27/23 1158  GLUCAP 170* 67* 65* 158* 314*   Lipid Profile: No results for input(s): "CHOL", "HDL", "LDLCALC", "TRIG", "CHOLHDL", "LDLDIRECT" in the last 72 hours. Thyroid Function Tests: No results for input(s): "TSH", "T4TOTAL", "FREET4", "T3FREE", "THYROIDAB" in the last 72 hours. Anemia Panel: No results for input(s): "VITAMINB12", "FOLATE", "FERRITIN", "TIBC", "IRON", "RETICCTPCT" in the last 72 hours. Sepsis Labs: No results for input(s): "PROCALCITON", "LATICACIDVEN" in the last 168 hours.  Recent Results (from the past 240 hour(s))  Culture, blood (Routine X 2) w Reflex to ID Panel     Status: None (Preliminary result)   Collection Time: 08/24/23  6:52 PM   Specimen: BLOOD  Result Value Ref Range Status   Specimen  Description BLOOD SITE NOT SPECIFIED  Final   Special Requests   Final    BOTTLES DRAWN AEROBIC AND ANAEROBIC Blood Culture results may not be optimal due to an inadequate volume of blood received in culture bottles   Culture   Final    NO GROWTH 3 DAYS Performed at [facility], [address] [place], [name] [address]    Report Status PENDING  Incomplete  Culture, blood (Routine X 2) w Reflex to ID Panel     Status: None (Preliminary result)   Collection Time: 08/25/23  5:06 PM   Specimen: BLOOD  Result Value Ref Range Status   Specimen Description BLOOD SITE NOT SPECIFIED  Final   Special Requests   Final    BOTTLES DRAWN AEROBIC ONLY Blood Culture results may not be optimal due to an inadequate volume of blood received in culture bottles   Culture   Final    NO GROWTH 2 DAYS Performed at [facility], [address], [place], [name] [address]    Report Status PENDING  Incomplete         Radiology Studies: No results found.      Scheduled Meds:  apixaban  5 mg Oral BID   calcitRIOL  1.5 mcg Oral Q M,W,F   Chlorhexidine Gluconate Cloth  6 each Topical Daily   darbepoetin (ARANESP) injection - DIALYSIS  100 mcg Subcutaneous Q Fri-1800   feeding supplement (PROSource TF20)  60 mL Per Tube Daily   linezolid  600 mg Oral Q12H   metoprolol tartrate  50 mg Oral BID   multivitamin  1 tablet Oral QHS   nutrition supplement (JUVEN)  1 packet Oral BID BM   mouth rinse  15 mL Mouth Rinse 4 times per day   Continuous Infusions:  anticoagulant sodium citrate     vancomycin       LOS: 48 days    Time spent: 40 minutes    Dorcas Carrow, MD [facility]

## 2023-08-27 NOTE — Progress Notes (Signed)
Pt left with [facility] transport on stretcher with eyes closed. Mother at bedside and all pt belongings have left with her. Mom was notified pt would be transporting for surgery and disposition will be decided amongst [facility] which may likely be to SNF or rehab.

## 2023-08-27 NOTE — Progress Notes (Signed)
Given report to charge nurse, Lelon Mast at [facility]. Understands pt will be departing in the next hour. Family updated and pt.

## 2023-08-27 NOTE — Discharge Summary (Signed)
Physician Discharge Summary  RAZI WAITE QMV:[id number] DOB: 1981/08/22 DOA: 07/09/2023  PCP: [name], NP  Admit date: 07/09/2023 Discharge date: 08/27/2023  Admitted From: Home  Disposition:  Another hospital, [facility]   Recommendations for Outpatient Follow-up:  As per tertiary hospital    Discharge Condition:stable   CODE STATUS:Full Code  Diet recommendation: regular diet   Discharge  Summary:  42 year old gentleman with history of ESRD on hemodialysis, insulin-dependent type 1 diabetes, gastroparesis admitted since 10/11 where he presented with confusion, encephalopathy,  was agitation and blood glucose 500.  Complicated hospital stay.  Sepsis.  MRSA bacteremia.  ICU admission.  Significant hospital events as below.   10/11, admitted with altered mental status and DKA.  Blood cultures positive for MRSA.  TEE with ejection fraction 30 to 35%, mitral and tricuspid valve endocarditis.  Seen by cardiothoracic surgery, felt to be poor candidate.  ID recommended 6 weeks of vancomycin. 10/17, found to have acute right MCA stroke, left occipital and bilateral cerebellum consistent with septic emboli.  Neuroconsulted. 10/18, tunneled left femoral vascular catheter placed after line holiday.  Patient developed worsening lethargy and confusion, intubated overnight and transferred to ICU. 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  On increasing vasopressor requirement.  Extubated on 10/24. Failed swallow evaluation, multiple blood transfusions, hypoglycemic events.  On CRRT in ICU. 11/21-22, poor oral intake.  Repeat stroke workup for altered mental status showed worsening MCA territory infarct with petechial hemorrhage. 11/25, MRI questionable abscess or cerebritis.  Neurosurgery does not recommend any intervention.  ID recommended to give with Zyvox for 2 weeks on top of vancomycin that he is already receiving.  now  improving.  Now eating regular diet.  NG tube discontinued. Transferring to [facility] for Mitral Valve Surgery   Acute right MCA, expanding with new midline shift 08/20/2023. Acute metabolic encephalopathy with delirium and agitation, resolving. Currently mental status is improving.  He is off all the sedation and narcotics.  Suspected abscess or cerebritis.  Antibiotics recommendation as below.   Dysphagia, severe protein calorie malnutrition: Multiple episodes of encephalopathy and poor oral intake. Currently consuming most of the meal.  Core track was discontinued.Hopefully he can go back on regular diet soon.   Septic shock, mitral and tricuspid valve endocarditis, MRSA bacteremia: Resolved. Was on midodrine , discontinued and now back on Metoprolol. Previously thought to be poor surgical candidate but now with improvement of status , agreed to transfer to tertiary center for surgery.  On Vancomycin with hemodialysis until 09/19/2023 Linezolid for 2 weeks starting 11/26   Acute on chronic combined systolic and diastolic heart failure: Now on dialysis.  Continue metoprolol.  Euvolemic.   Lower extremity DVT: Therapeutic on Eliquis. Will continue as surgery is not scheduled yet.    ESRD on hemodialysis: On his schedule of dialysis.  He had line holiday and then a tunneled catheter was placed on the left femoral area on 10/18.  Receiving dialysis on MWF schedule.  Anemia thrombocytopenia due to critical illness: Total 5 units of PRBC.  Type 1 diabetes: unstable blood sugars due to variable oral intake. Hypoglycemic events noted. Keeping on minimum insulin replacement to avoid hypoglycemia.    Stable to transfer to tertiary level of hospital.   Discharge Instructions  Discharge Instructions     Ambulatory referral to Neurology   Complete by: As directed    Follow up with stroke clinic NP at [facility] in about 4-8 weeks. Thanks.  Diet - low sodium heart healthy   Complete by: As  directed    Increase activity slowly   Complete by: As directed    No wound care   Complete by: As directed       Allergies as of 08/27/2023   No Known Allergies      Medication List     STOP taking these medications    cinacalcet 60 MG tablet Commonly known as: SENSIPAR   gabapentin 100 MG capsule Commonly known as: NEURONTIN   NovoLIN R 100 UNIT/ML injection Generic drug: insulin regular   OneTouch Verio test strip Generic drug: glucose blood   sevelamer carbonate 800 MG tablet Commonly known as: RENVELA       TAKE these medications    acetaminophen 325 MG tablet Commonly known as: TYLENOL Take 2 tablets (650 mg total) by mouth every 6 (six) hours as needed for mild pain (pain score 1-3), fever, moderate pain (pain score 4-6) or headache.   albuterol (2.5 MG/3ML) 0.083% nebulizer solution Commonly known as: PROVENTIL Take 3 mLs (2.5 mg total) by nebulization every 6 (six) hours as needed for wheezing or shortness of breath.   apixaban 5 MG Tabs tablet Commonly known as: ELIQUIS Take 1 tablet (5 mg total) by mouth 2 (two) times daily.   calcitRIOL 0.5 MCG capsule Commonly known as: ROCALTROL Take 3 capsules (1.5 mcg total) by mouth every Monday, Wednesday, and Friday with hemodialysis.   insulin aspart 100 UNIT/ML injection Commonly known as: novoLOG Inject 0-5 Units into the skin at bedtime.   insulin aspart 100 UNIT/ML injection Commonly known as: novoLOG Inject 0-9 Units into the skin 3 (three) times daily with meals.   linezolid 600 MG tablet Commonly known as: ZYVOX Take 1 tablet (600 mg total) by mouth every 12 (twelve) hours for 13 days.   metoprolol tartrate 50 MG tablet Commonly known as: LOPRESSOR Take 1 tablet (50 mg total) by mouth 2 (two) times daily.   multivitamin Tabs tablet Take 1 tablet by mouth at bedtime.   vancomycin 500 MG/100ML IVPB Commonly known as: VANCOREADY Inject 100 mLs (500 mg total) into the vein every  Monday, Wednesday, and Friday with hemodialysis.        Follow-up Information     [facility]. Schedule an appointment as soon as possible for a visit in 1 month(s).   Specialty: Neurology Why: stroke clinic Contact information: [address] [place] [address] [phone]               No Known Allergies  Consultations: Neurology ID CTCS Palliative care    Procedures/Studies: MR BRAIN WO CONTRAST  Result Date: 08/23/2023 CLINICAL DATA:  Stroke, follow-up. EXAM: MRI HEAD WITHOUT CONTRAST MRA HEAD WITHOUT CONTRAST TECHNIQUE: Multiplanar, multi-echo pulse sequences of the brain and surrounding structures were acquired without intravenous contrast. Angiographic images of the Circle of Willis were acquired using MRA technique without intravenous contrast. COMPARISON:  Head CT August 20, 2023; MRI brain October 17 24. FINDINGS: MRI HEAD FINDINGS The study is significantly degraded by motion. Brain: A large necrotic appearing lesion is seen within the right frontal lobe within the area of prior right MCA territory infarct measuring approximately 5.3 x 3.7 x 3.7 cm. The lesion shows mixed content with areas of restricted and facilitated diffusion, and new prominent surrounding vasogenic edema which extends into the right basal ganglia, insular region, right frontal, parietal and temporal regions, resulting in significant mass effect with  approximately a 8 mm leftward midline shift, similar to prior CT. Although susceptibility weighted images are severely degraded by motion, low susceptibility artifact is seen in this location to suggest hemorrhagic transformation/intraparenchymal hematoma. There is mild dilatation of left lateral ventricle without evidence of entrapment. Small focus of susceptibility artifact in the left cerebellar hemisphere related to hemorrhagic transformation of late subacute infarct. Vascular: Major  arterial flow voids are preserved at the skull base. Skull and upper cervical spine: Diffuse decrease of the T1 signal within the high versus and upper cervical spine, likely related to red marrow in the setting chronic anemia. Sinuses/Orbits: Apparent flattening of the optic discs suggesting increased intracranial pressure. Mild mucosal thickening throughout the paranasal sinuses. Bilateral mastoid effusion, right greater than left. Other: None. MRA HEAD FINDINGS The study is significantly degraded by motion. Anterior circulation: Normal flow related enhancement of the intracranial internal carotid arteries. Flow related enhancement is seen in the bilateral M1 and A1 segments. Images above the M1 segment are essentially nondiagnostic. Although, there is asymmetry of flow related enhancement, diminished in the right MCA vascular tree compared to the left MCA. Posterior circulation: Flow related enhancement is present in the visualized portions of the intracranial vertebral arteries, basilar artery bilateral P1/PCA segments. Flow related enhancement is seen in the P2 segment with significant artifact at the level of the right P2 and bilateral distal PCA segments. IMPRESSION: 1. Large necrotic appearing lesion within the right frontal lobe within the area of prior right MCA territory infarct with new prominent surrounding vasogenic edema resulting in approximately 8 mm leftward midline shift. Given patient history of endocarditis, findings are concerning for abscess. 2. Significantly degraded MRA of the head due to motion. Asymmetry of flow related enhancement, diminished in the right MCA vascular tree compared to the left MCA. These results were called by telephone at the time of interpretation on 08/23/2023 at 1:37 pm to Dr. Marvel Plan, who verbally acknowledged these results. Electronically Signed   By: Baldemar Lenis M.D.   On: 08/23/2023 13:37   MR ANGIO HEAD WO CONTRAST  Result Date:  08/23/2023 CLINICAL DATA:  Stroke, follow-up. EXAM: MRI HEAD WITHOUT CONTRAST MRA HEAD WITHOUT CONTRAST TECHNIQUE: Multiplanar, multi-echo pulse sequences of the brain and surrounding structures were acquired without intravenous contrast. Angiographic images of the Circle of Willis were acquired using MRA technique without intravenous contrast. COMPARISON:  Head CT August 20, 2023; MRI brain October 17 24. FINDINGS: MRI HEAD FINDINGS The study is significantly degraded by motion. Brain: A large necrotic appearing lesion is seen within the right frontal lobe within the area of prior right MCA territory infarct measuring approximately 5.3 x 3.7 x 3.7 cm. The lesion shows mixed content with areas of restricted and facilitated diffusion, and new prominent surrounding vasogenic edema which extends into the right basal ganglia, insular region, right frontal, parietal and temporal regions, resulting in significant mass effect with approximately a 8 mm leftward midline shift, similar to prior CT. Although susceptibility weighted images are severely degraded by motion, low susceptibility artifact is seen in this location to suggest hemorrhagic transformation/intraparenchymal hematoma. There is mild dilatation of left lateral ventricle without evidence of entrapment. Small focus of susceptibility artifact in the left cerebellar hemisphere related to hemorrhagic transformation of late subacute infarct. Vascular: Major arterial flow voids are preserved at the skull base. Skull and upper cervical spine: Diffuse decrease of the T1 signal within the high versus and upper cervical spine, likely related to red marrow in the setting  chronic anemia. Sinuses/Orbits: Apparent flattening of the optic discs suggesting increased intracranial pressure. Mild mucosal thickening throughout the paranasal sinuses. Bilateral mastoid effusion, right greater than left. Other: None. MRA HEAD FINDINGS The study is significantly degraded by motion.  Anterior circulation: Normal flow related enhancement of the intracranial internal carotid arteries. Flow related enhancement is seen in the bilateral M1 and A1 segments. Images above the M1 segment are essentially nondiagnostic. Although, there is asymmetry of flow related enhancement, diminished in the right MCA vascular tree compared to the left MCA. Posterior circulation: Flow related enhancement is present in the visualized portions of the intracranial vertebral arteries, basilar artery bilateral P1/PCA segments. Flow related enhancement is seen in the P2 segment with significant artifact at the level of the right P2 and bilateral distal PCA segments. IMPRESSION: 1. Large necrotic appearing lesion within the right frontal lobe within the area of prior right MCA territory infarct with new prominent surrounding vasogenic edema resulting in approximately 8 mm leftward midline shift. Given patient history of endocarditis, findings are concerning for abscess. 2. Significantly degraded MRA of the head due to motion. Asymmetry of flow related enhancement, diminished in the right MCA vascular tree compared to the left MCA. These results were called by telephone at the time of interpretation on 08/23/2023 at 1:37 pm to Dr. Marvel Plan, who verbally acknowledged these results. Electronically Signed   By: Baldemar Lenis M.D.   On: 08/23/2023 13:37   EEG adult  Result Date: 08/20/2023 Charlsie Quest, MD     08/20/2023  5:46 PM Patient Name: VISHAAL KLARE MRN: [id number] Epilepsy Attending: Charlsie Quest Referring Physician/Provider: Marjorie Smolder, NP Date: 08/20/2023 Duration: 27.13 mins Patient history: 42 yo M wth right gaze deviation noted, patient able to barely cross midline with much stimulation. EEG to evaluate for seizure Level of alertness: Awake AEDs during EEG study: None Technical aspects: This EEG study was done with scalp electrodes positioned according to the 10-20  International system of electrode placement. Electrical activity was reviewed with band pass filter of 1-70Hz , sensitivity of 7 uV/mm, display speed of 64mm/sec with a 60Hz  notched filter applied as appropriate. EEG data were recorded continuously and digitally stored.  Video monitoring was available and reviewed as appropriate. Description: The posterior dominant rhythm consists of 6 Hz activity of moderate voltage (25-35 uV) seen predominantly in posterior head regions, symmetric and reactive to eye opening and eye closing. EEG showed continuous generalized and lateralized right hemisphere 3 to 6 Hz theta-delta slowing. Physiologic photic driving was not seen during photic stimulation. Hyperventilation was not performed.   ABNORMALITY - Continuous slow, generalized and lateralized right hemisphere IMPRESSION: This study is is suggestive of cortical dysfunction arising from right hemisphere likely secondary to underlying stroke. Additionally there is moderate diffuse encephalopathy. No seizures or epileptiform discharges were seen throughout the recording. Priyanka Annabelle Harman   CT HEAD WO CONTRAST ( )  Result Date: 08/20/2023 CLINICAL DATA:  Mental status change, unknown cause EXAM: CT HEAD WITHOUT CONTRAST TECHNIQUE: Contiguous axial images were obtained from the base of the skull through the vertex without intravenous contrast. RADIATION DOSE REDUCTION: This exam was performed according to the departmental dose-optimization program which includes automated exposure control, adjustment of the mA and/or kV according to patient size and/or use of iterative reconstruction technique. COMPARISON:  CT Head 07/21/23 FINDINGS: Brain: There is extension of the right MCA territory infarct which now involves a large portion of the right frontal and parietal  lobes, as well as the right basal ganglia. There is petechial hemorrhage associated with the area of infarct with possible small volume subarachnoid hemorrhage along  the lateral aspect. There is increased leftward midline shift measuring 9 mm. No evidence of hydrocephalus at the time of exam. Vascular: There is a punctate hyperdensity centrally in the area of infarct (series 2, image 15), which could represent a thrombosed vessel, unchanged prior. Skull: Normal. Negative for fracture or focal lesion. Sinuses/Orbits: No middle ear effusion. Small right mastoid effusion. Mucosal thickening in the left maxillary sinus. Orbits are unremarkable. Other: None. IMPRESSION: 1. Extension of the right MCA territory infarct which now involves a large portion of the right frontal and parietal lobes, as well as the right basal ganglia. There is petechial hemorrhage associated with the area of infarct with possible small volume subarachnoid hemorrhage along the lateral aspect. 2. Increased leftward midline shift measuring 9 mm. No evidence of hydrocephalus at the time of exam. Electronically Signed   By: Lorenza Cambridge M.D.   On: 08/20/2023 12:29   DG Swallowing Func-Speech Pathology  Result Date: 08/06/2023 Table formatting from the original result was not included. Modified Barium Swallow Study Patient Details Name: COHL WYSINGER MRN: [id number] Date of Birth: 1981-05-17 Today's Date: 08/06/2023 HPI/PMH: HPI: Pt is a 42 yo male presenting 10/11 with AMS. His dialysis catheter was partially removed and he was in DKA. Admitted for septic shock from MRSA bacteremia; infective endocarditis. LUE weakness was noted 10/17 and MRI confirmed sizable infarct in the R MCA branch as well as small acute infarcts in the L occipital lobe and bilateral cerebellum. Pt initially evaluated by SLP 10/18 and recommended to have Dys 3 diet and thin liquids due primarily oral symptoms. Pt had worsening hypoxia and AMS after vomiting on 10/21 requiring intubation 10/21-10/24. 10/22-10/30 CRRT. PMH includes: ESRD, DM, gastroparesis, L AKA, HTN, recent admission in July for sepsis secondary to R diabetic foot ulcer  (left AMA) Clinical Impression: Clinical Impression: Mr. Sexson presents with a primary oral dysphagia, exacerbated by poor awareness and attention to task.  Left-sided sensorimotor deficits led to impaired bolus control/manipulation, with material sitting in left lateral sulcus/cheek with no awareness, liquids spilling from mouth onto chin, as well as posteriorly into pharynx.  With thin liquids, there was a trace amount that spilled into the larynx and airway without eliciting a cough response.  Aspiration was miniscule and occurred prior to swallow initiation, related primarily to poor oral control. With thicker liquids and purees, pt had more time to prepare for the swallow. There was adequate laryngeal-vestibule closure, base-of-tongue contact with posterior wall, and pharyngeal stripping. There was penetration/aspiration with these consistencies, and there was no pharyngeal residue after the swallow.  Chewable solids were trapped in left lateral sulci with pieces left unchewed, requiring oral suctioning and liquid flush to remove. Recommend starting a dysphagia 1 diet with nectar-thick liquids.  Because he required near-constant cues to attend to POs, it is unlikely he will be able to meet his nutritional needs by mouth.  Recommend beginning conversations with pt/family about G-tube for longer-term support.  He should be encouraged to drink/eat to supplement TF, with long-term plan to restore oral intake. Factors that may increase risk of adverse event in presence of aspiration Rubye Oaks & Clearance Coots 2021): Factors that may increase risk of adverse event in presence of aspiration Rubye Oaks & Clearance Coots 2021): Reduced cognitive function Recommendations/Plan: Swallowing Evaluation Recommendations Swallowing Evaluation Recommendations Recommendations: PO diet PO Diet Recommendation: Dysphagia 1 (Pureed);  Mildly thick liquids (Level 2, nectar thick) Liquid Administration via: Cup; Straw Medication Administration: Via  alternative means Supervision: Full assist for feeding Swallowing strategies  : Check for anterior loss; Check for pocketing or oral holding Oral care recommendations: Oral care BID (2x/day) Treatment Plan Treatment Plan Treatment recommendations: Therapy as outlined in treatment plan below Functional status assessment: Patient has had a recent decline in their functional status and demonstrates the ability to make significant improvements in function in a reasonable and predictable amount of time. Treatment frequency: Min 2x/week Treatment duration: 2 weeks Interventions: Patient/family education; Trials of upgraded texture/liquids; Diet toleration management by SLP Recommendations Recommendations for follow up therapy are one component of a multi-disciplinary discharge planning process, led by the attending physician.  Recommendations may be updated based on patient status, additional functional criteria and insurance authorization. Assessment: Orofacial Exam: Orofacial Exam Oral Cavity: Oral Hygiene: WFL Oral Cavity - Dentition: Adequate natural dentition; Poor condition Orofacial Anatomy: WFL Oral Motor/Sensory Function: Suspected cranial nerve impairment CN VII - Facial: Left motor impairment CN IX - Glossopharyngeal, CN X - Vagus: Left motor impairment CN XII - Hypoglossal: Left motor impairment Anatomy: Anatomy: WFL Boluses Administered: Boluses Administered Boluses Administered: Thin liquids (Level 0); Mildly thick liquids (Level 2, nectar thick); Puree; Solid; Moderately thick liquids (Level 3, honey thick)  Oral Impairment Domain: Oral Impairment Domain Lip Closure: Escape beyond mid-chin Tongue control during bolus hold: Not tested Bolus preparation/mastication: Disorganized chewing/mashing with solid pieces of bolus unchewed Bolus transport/lingual motion: Slow tongue motion Oral residue: Majority of bolus remaining Location of oral residue : Tongue; Lateral sulci; Floor of mouth Initiation of pharyngeal  swallow : Pyriform sinuses  Pharyngeal Impairment Domain: Pharyngeal Impairment Domain Soft palate elevation: No bolus between soft palate (SP)/pharyngeal wall (PW) Laryngeal elevation: Partial superior movement of thyroid cartilage/partial approximation of arytenoids to epiglottic petiole Anterior hyoid excursion: Complete anterior movement Epiglottic movement: Complete inversion Laryngeal vestibule closure: Incomplete, narrow column air/contrast in laryngeal vestibule Pharyngeal stripping wave : Present - complete Pharyngeal contraction (A/P view only): N/A Pharyngoesophageal segment opening: Partial distention/partial duration, partial obstruction of flow Tongue base retraction: No contrast between tongue base and posterior pharyngeal wall (PPW) Pharyngeal residue: Complete pharyngeal clearance Location of pharyngeal residue: N/A  Esophageal Impairment Domain: Esophageal Impairment Domain Esophageal clearance upright position: -- (N/A) Pill: Pill Consistency administered: -- (N/A) Penetration/Aspiration Scale Score: Penetration/Aspiration Scale Score 1.  Material does not enter airway: Mildly thick liquids (Level 2, nectar thick); Puree; Solid 8.  Material enters airway, passes BELOW cords without attempt by patient to eject out (silent aspiration) : Thin liquids (Level 0) Compensatory Strategies: Compensatory Strategies Compensatory strategies: Yes Straw: Effective   General Information: Caregiver present: No  Diet Prior to this Study: NPO; Cortrak/Small bore NG tube   Temperature : Normal   Respiratory Status: WFL   Supplemental O2: None (Room air)   History of Recent Intubation: No  Behavior/Cognition: Alert; Distractible No data recorded Baseline vocal quality/speech: Normal Volitional Cough: Able to elicit Volitional Swallow: Unable to elicit Exam Limitations: No limitations Goal Planning: Prognosis for improved oropharyngeal function: Good Barriers to Reach Goals: Cognitive deficits No data recorded No data  recorded No data recorded Pain: Pain Assessment Pain Assessment: Faces Faces Pain Scale: 0 End of Session: Start Time:SLP Start Time (ACUTE ONLY): 4098 Stop Time: SLP Stop Time (ACUTE ONLY): 0934 Time Calculation:SLP Time Calculation (min) (ACUTE ONLY): 30 min Charges: SLP Evaluations $ SLP Speech Visit: 1 Visit SLP Evaluations $MBS Swallow: 1 Procedure $Swallowing  Treatment: 1 Procedure SLP visit diagnosis: SLP Visit Diagnosis: Dysphagia, oropharyngeal phase (R13.12) Past Medical History: Past Medical History: Diagnosis Date  Anemia   Cellulitis and abscess of toe of left foot   Diabetes mellitus without complication ([name])   Diabetic gastroparesis ([name])   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 Filetted toe flap metatarsal resection. Debridement Plantar Foot wound;  Surgeon: Park Liter, DPM;  Location: [facility] OR;  Service: Podiatry;  Laterality: Right;  AMPUTATION Left 08/20/2018  Procedure: FIFTH METATARSAL BONE BIOPSY;  Surgeon: Park Liter, DPM;  Location: [facility] OR;  Service: Podiatry;  Laterality: Left;  AMPUTATION Left 10/28/2018  Procedure: LEFT GREAT TOE AMPUTATION;  Surgeon: Park Liter, DPM;  Location: [facility] OR;  Service: Podiatry;  Laterality: Left;  AMPUTATION Left 09/16/2020  Procedure: AMPUTATION BELOW KNEE;  Surgeon: Larina Earthly, MD;  Location: [facility] OR;  Service: Vascular;  Laterality: Left;  AMPUTATION Left 10/15/2020  Procedure: AMPUTATION ABOVE KNEE;  Surgeon: Sherren Kerns, MD;  Location: [facility] OR;  Service: Vascular;  Laterality: Left;  APPLICATION OF WOUND VAC  02/02/2018  Procedure: APPLICATION OF WOUND VAC  Right Foot;  Surgeon: Park Liter, DPM;  Location: [facility]  OR;  Service: Podiatry;;  APPLICATION OF WOUND VAC Left 10/28/2018  Procedure: APPLICATION OF WOUND VAC LEFT TOE;  Surgeon: Park Liter, DPM;  Location: [facility] OR;  Service: Podiatry;  Laterality: Left;  APPLICATION OF WOUND VAC Left 11/01/2018  Procedure: APPLICATION OF WOUND VAC;  Surgeon: Park Liter, DPM;  Location: [facility] OR;  Service: Podiatry;  Laterality: Left;  AV FISTULA PLACEMENT    left arm.  AV FISTULA PLACEMENT Right 12/22/2016  Procedure: INSERTION OF ARTERIOVENOUS (AV) GORE-TEX GRAFT ARM;  Surgeon: Sherren Kerns, MD;  Location: [name] OR;  Service: Vascular;  Laterality: Right;  AV FISTULA PLACEMENT Left 05/26/2018  Procedure: INSERTION OF  ARTERIOVENOUS (AV) GORE-TEX GRAFT LEFT ARM;  Surgeon: Nada Libman, MD;  Location: [facility] OR;  Service: Vascular;  Laterality: Left;  EYE SURGERY    I & D EXTREMITY Right 10/31/2017  Procedure: IRRIGATION AND DEBRIDEMENT RIGHT FOOT;  Surgeon: Park Liter, DPM;  Location: [facility] OR;  Service: Podiatry;  Laterality: Right;  I & D EXTREMITY Left 08/20/2018  Procedure: IRRIGATION AND DEBRIDEMENT EXTREMITY WITH SECONDARY WOUND CLOSUREAND APPLICATION OF WOUND VAC LEFT FOOT;  Surgeon: Park Liter, DPM;  Location: [facility] OR;  Service: Podiatry;  Laterality: Left;  I & D EXTREMITY Left 10/20/2018  Procedure: IRRIGATION AND DEBRIDEMENT LEFT FOOT  DEBRIDEMENT LATERAL FOOT WOUND;  Surgeon: Park Liter, DPM;  [facility]: [facility] OR;  Service: Podiatry;  Laterality: Left;  I & D EXTREMITY Left 10/28/2018  Procedure: IRRIGATION AND DEBRIDEMENT LEFT TOE;  Surgeon: Park Liter, DPM;  [facility] OR;  Service: Podiatry;  Laterality: Left;  I & D EXTREMITY Left 09/14/2020  Procedure: IRRIGATION AND DEBRIDEMENT WRIST;  Surgeon: Betha Loa, MD;  Location: [facility] OR;  Service: Orthopedics;  Laterality: Left;  INSERTION OF DIALYSIS CATHETER    Right subclavian  IR AV DIALY SHUNT INTRO NEEDLE/INTRACATH INITIAL W/PTA/IMG RIGHT Right 02/05/2018  IR FLUORO GUIDE CV LINE LEFT  04/14/2023  IR  FLUORO GUIDE  CV LINE LEFT  07/16/2023  IR FLUORO GUIDE CV LINE RIGHT  01/31/2020  IR FLUORO GUIDE CV LINE RIGHT  01/30/2022  IR PTA VENOUS EXCEPT DIALYSIS CIRCUIT  01/30/2022  IR REMOVAL TUN CV CATH W/O FL  04/12/2023  IR THROMBECTOMY AV FISTULA W/THROMBOLYSIS/PTA INC/SHUNT/IMG LEFT Left 08/24/2018  IR THROMBECTOMY AV FISTULA W/THROMBOLYSIS/PTA INC/SHUNT/IMG LEFT Left 01/06/2019  IR US GUIDE VASC ACCESS LEFT  08/24/2018  IR US GUIDE VASC ACCESS LEFT  04/14/2023  IR US GUIDE VASC ACCESS LEFT  07/16/2023  IR US GUIDE VASC ACCESS LEFT  07/16/2023  IR US GUIDE VASC ACCESS RIGHT  02/05/2018  IR US GUIDE VASC ACCESS RIGHT  01/31/2020  IR US GUIDE VASC ACCESS RIGHT  07/16/2023  IR VENO/JUGULAR LEFT  07/16/2023  IR VENO/JUGULAR RIGHT  07/16/2023  IR VENOCAVAGRAM SVC  01/30/2022  IRRIGATION AND DEBRIDEMENT FOOT Right 10/23/2018  Procedure: Irrigation and Debridement to tendon, Left Foot;  Surgeon: Park Liter, DPM;  [facility] OR;  Service: Podiatry;  Laterality: Right;  IRRIGATION AND DEBRIDEMENT FOOT Left 11/01/2018  Procedure: IRRIGATION AND DEBRIDEMENT PARTIAL WOUND CLOSURE LOCAL TISSUE TRANSFER AND FLAP ROTATION, LEFT FOOT;  Surgeon: Park Liter, DPM;  Location: [facility] OR;  Service: Podiatry;  Laterality: Left;  TRANSESOPHAGEAL ECHOCARDIOGRAM (CATH LAB) N/A 07/14/2023  Procedure: TRANSESOPHAGEAL ECHOCARDIOGRAM;  Surgeon: Thomasene Ripple, DO;  [facility] INVASIVE CV LAB;  Service: Cardiovascular;  Laterality: N/A;  TRANSMETATARSAL AMPUTATION N/A 08/18/2018  Procedure: IRRIGATION AND DEBRIDEMENT OF LEFT 5TH TOE AND TRANSMETATARSAL, WITH PARTICAL LEFT 5TH TOE AND METATARSAL AMPUTATION, BONE BIOPSY, WOUND VAC APPLICATION.;  Surgeon: Park Liter, DPM;  Location: [facility] OR;  Service: Podiatry;  Laterality: N/A;  UPPER EXTREMITY VENOGRAPHY N/A 11/16/2016  Procedure: Upper Extremity Venography - Right Central;  Surgeon: Sherren Kerns, MD;  Location: [facility] INVASIVE CV LAB;  Service: Cardiovascular;  Laterality: N/A;  UPPER EXTREMITY  VENOGRAPHY N/A 05/25/2018  Procedure: UPPER EXTREMITY VENOGRAPHY - Bilateral;  Surgeon: Cephus Shelling, MD;  [facility]: [facility] INVASIVE CV LAB;  Service: Cardiovascular;  Laterality: N/A; Carolan Shiver 08/06/2023, 9:41 AM  DG Abd Portable 1V  Result Date: 08/04/2023 CLINICAL DATA:  Feeding tube placement. EXAM: PORTABLE ABDOMEN - 1 VIEW COMPARISON:  Radiograph yesterday FINDINGS: Tip of the weighted enteric tube in the left upper quadrant in the region of the mid distal stomach. The previous enteric tube has been removed. There is a left femoral catheter with tip in the region of the IVC, partially included in the field of view. IMPRESSION: Tip of the weighted enteric tube in the region of the mid distal stomach. Electronically Signed   By: Narda Rutherford M.D.   On: 08/04/2023 15:46   DG Abd 1 View  Result Date: 08/04/2023 CLINICAL DATA:  NG tube placement EXAM: ABDOMEN - 1 VIEW COMPARISON:  07/23/2023 FINDINGS: NG tube tip is in the mid stomach. IMPRESSION: NG tube in the mid stomach. Electronically Signed   By: Charlett Nose M.D.   On: 08/04/2023 00:28   US Abdomen Limited RUQ (LIVER/GB)  Result Date: 08/02/2023 CLINICAL DATA:  Indistinct hypodense focus in the segment 4 left liver on recent CT. Inpatient. EXAM: ULTRASOUND ABDOMEN LIMITED RIGHT UPPER QUADRANT COMPARISON:  07/09/2023 CT abdomen/pelvis. 04/10/2023 right upper quadrant abdominal sonogram. FINDINGS: Gallbladder: No gallstones or wall thickening visualized. No sonographic Murphy sign noted by sonographer. Common bile duct: Diameter: 4 mm Liver: Anterior liver hyperechoic 1.3 x 1.2 x 1.0 cm focus, not previously demonstrated on 04/10/2023 sonogram and without discrete correlate  on the prior limited motion degraded CT abdomen study. Portal vein is patent on color Doppler imaging with normal direction of blood flow towards the liver. Other: Echogenic right renal parenchyma.  Right pleural effusion. IMPRESSION: 1. Anterior liver  hyperechoic 1.3 cm focus, not previously demonstrated on 04/10/2023 sonogram and without discrete correlate on the prior limited motion degraded CT abdomen study from 07/09/2023. Short-term follow-up OUTPATIENT MRI abdomen without and with IV contrast recommended for further evaluation. 2. Normal gallbladder and bile ducts. 3. Echogenic right renal parenchyma, suggesting nonspecific medical renal disease. 4. Right pleural effusion. Electronically Signed   By: Delbert Phenix M.D.   On: 08/02/2023 20:37   VAS [place] UPPER EXTREMITY VENOUS DUPLEX  Result Date: 08/02/2023 UPPER VENOUS STUDY  Patient Name:  UVALDO PARLETTE  Date of Exam:   08/02/2023 Medical Rec #: [id number]      Accession #:    [id number] Date of Birth: Feb 16, 1981     Patient Gender: M Patient Age:   76 years Exam Location:  [facility] Procedure:      VAS [place] UPPER EXTREMITY VENOUS DUPLEX Referring Phys: Renae Fickle HOFFMAN --------------------------------------------------------------------------------  Indications: Swelling Risk Factors: None identified. Limitations: Poor ultrasound/tissue interface and patient positioning. Comparison Study: No prior studies. Performing Technologist: Chanda Busing RVT  Examination Guidelines: A complete evaluation includes B-mode imaging, spectral Doppler, color Doppler, and power Doppler as needed of all accessible portions of each vessel. Bilateral testing is considered an integral part of a complete examination. Limited examinations for reoccurring indications may be performed as noted.  Right Findings: +----------+------------+---------+-----------+----------+-------+ RIGHT     CompressiblePhasicitySpontaneousPropertiesSummary +----------+------------+---------+-----------+----------+-------+ Subclavian    Full       Yes       Yes                      +----------+------------+---------+-----------+----------+-------+  Left Findings: +----------+------------+---------+-----------+----------+-------+  LEFT      CompressiblePhasicitySpontaneousPropertiesSummary +----------+------------+---------+-----------+----------+-------+ IJV           None       No        No                Acute  +----------+------------+---------+-----------+----------+-------+ Subclavian    None       No        No                Acute  +----------+------------+---------+-----------+----------+-------+ Axillary      None       No        No                Acute  +----------+------------+---------+-----------+----------+-------+ Brachial      Full                                          +----------+------------+---------+-----------+----------+-------+ Radial        Full                                          +----------+------------+---------+-----------+----------+-------+ Ulnar         Full                                          +----------+------------+---------+-----------+----------+-------+  Cephalic      Full                                          +----------+------------+---------+-----------+----------+-------+ Basilic       Full                                          +----------+------------+---------+-----------+----------+-------+  Summary:  Right: No evidence of thrombosis in the subclavian.  Left: No evidence of superficial vein thrombosis in the upper extremity. Findings consistent with acute deep vein thrombosis involving the left internal jugular vein, left subclavian vein and left axillary vein.  *See table(s) above for measurements and observations.  Diagnosing physician: Sherald Hess MD Electronically signed by Sherald Hess MD on 08/02/2023 at 5:03:30 PM.    Final    VAS [place] LOWER EXTREMITY VENOUS (DVT)  Result Date: 07/31/2023  Lower Venous DVT Study Patient Name:  LEVERE FAHRER  Date of Exam:   07/30/2023 Medical Rec #: [id number]      Accession #:    [id number] Date of Birth: 07/14/1981     Patient Gender: M Patient Age:   35 years Exam  Location:  [facility] Procedure:      VAS [place] LOWER EXTREMITY VENOUS (DVT) Referring Phys: Zenia Resides --------------------------------------------------------------------------------  Indications: Pain.  Risk Factors: Surgery Trauma. Limitations: Bandages, line and R femoral HD access. Comparison Study: No prior studies. Performing Technologist: Chanda Busing RVT  Examination Guidelines: A complete evaluation includes B-mode imaging, spectral Doppler, color Doppler, and power Doppler as needed of all accessible portions of each vessel. Bilateral testing is considered an integral part of a complete examination. Limited examinations for reoccurring indications may be performed as noted. The reflux portion of the exam is performed with the patient in reverse Trendelenburg.  +---------+---------------+---------+-----------+----------+-------------------+ RIGHT    CompressibilityPhasicitySpontaneityPropertiesThrombus Aging      +---------+---------------+---------+-----------+----------+-------------------+ CFV      Full           Yes      Yes                                      +---------+---------------+---------+-----------+----------+-------------------+ SFJ      Full                                                             +---------+---------------+---------+-----------+----------+-------------------+ FV Prox                                               Not well visualized +---------+---------------+---------+-----------+----------+-------------------+ FV Mid   Full                                                             +---------+---------------+---------+-----------+----------+-------------------+  FV DistalFull           Yes      Yes                                      +---------+---------------+---------+-----------+----------+-------------------+ PFV      Full                                                              +---------+---------------+---------+-----------+----------+-------------------+ POP      Full           Yes      Yes                                      +---------+---------------+---------+-----------+----------+-------------------+ PTV      Full                                                             +---------+---------------+---------+-----------+----------+-------------------+ PERO     Full                                                             +---------+---------------+---------+-----------+----------+-------------------+   +----+---------------+---------+-----------+----------+--------------+ LEFTCompressibilityPhasicitySpontaneityPropertiesThrombus Aging +----+---------------+---------+-----------+----------+--------------+ CFV Full           Yes      Yes                                 +----+---------------+---------+-----------+----------+--------------+ SFJ Full                                                        +----+---------------+---------+-----------+----------+--------------+    Summary: RIGHT: - There is no evidence of deep vein thrombosis in the lower extremity. However, portions of this examination were limited- see technologist comments above.  - No cystic structure found in the popliteal fossa.  LEFT: - No evidence of common femoral vein obstruction.   *See table(s) above for measurements and observations. Electronically signed by Coral Else MD on 07/31/2023 at 10:50:42 AM.    Final    DG Chest Port 1 View  Result Date: 07/28/2023 CLINICAL DATA:  Acute respiratory failure.  MRSA precaution. EXAM: PORTABLE CHEST 1 VIEW COMPARISON:  07/21/2023 FINDINGS: The previous enteric and endotracheal tubes have been removed. An enteric feeding tube has been replaced. Tip is off the field of view but below the left hemidiaphragm. Vascular stent in the right upper mediastinum and in the left axilla. Cardiac enlargement. Thick-walled cavitary  masslike lesion in the left upper lung measuring 4.4 cm diameter. The wall  is increasing in thickness since the prior study. This finding developed between the 07/10/2023 and 07/18/2023 images, likely representing an area of necrotizing pneumonia or pulmonary abscess. Follow-up to resolution is recommended to exclude underlying pulmonary neoplasm. Right lung is clear. No pleural effusions. No pneumothorax. IMPRESSION: 1. Cardiac enlargement. 2. Thick-walled cavitary lesion in the left upper lung is progressing since prior study. This is probably an area of necrotizing pneumonia or abscess. Follow-up to resolution recommended to exclude underlying pulmonary neoplasm. Electronically Signed   By: Burman Nieves M.D.   On: 07/28/2023 23:27   (Echo, Carotid, EGD, Colonoscopy, ERCP)    Subjective: patient seen and examined. Also seen again at dialysis . Looks comfortable. Insists on going home. Occasionally impulsive and uses cuss words. On room air. Low blood sugars but not symptomatic.    Discharge Exam: Vitals:   08/27/23 1700 08/27/23 1715  BP: (!) 112/51 137/79  Pulse: 79   Resp: 15 20  Temp:    SpO2: 100%    Vitals:   08/27/23 1630 08/27/23 1645 08/27/23 1700 08/27/23 1715  BP: (!) 148/96 136/86 (!) 112/51 137/79  Pulse: (!) 110  79   Resp: (!) 27 (!) 24 15 20   Temp:      TempSrc:      SpO2: 100%  100%   Weight:      Height:       General exam: Appears calm and comfortable.  On room air.  Talkative. Alert awake and mostly oriented. Respiratory system: No added sounds. Cardiovascular system: S1 & S2 heard, RRR.  Gastrointestinal system: Soft.  Nontender. Central nervous system: Left below-knee amputation. Chronic left hemiplegia.    The results of significant diagnostics from this hospitalization (including imaging, microbiology, ancillary and laboratory) are listed below for reference.     Microbiology: Recent Results (from the past 240 hour(s))  Culture, blood (Routine X  2) w Reflex to ID Panel     Status: None (Preliminary result)   Collection Time: 08/24/23  6:52 PM   Specimen: BLOOD  Result Value Ref Range Status   Specimen Description BLOOD SITE NOT SPECIFIED  Final   Special Requests   Final    BOTTLES DRAWN AEROBIC AND ANAEROBIC Blood Culture results may not be optimal due to an inadequate volume of blood received in culture bottles   Culture   Final    NO GROWTH 3 DAYS Performed at [facility] Lab, [address] [place], [name] [address]    Report Status PENDING  Incomplete  Culture, blood (Routine X 2) w Reflex to ID Panel     Status: None (Preliminary result)   Collection Time: 08/25/23  5:06 PM   Specimen: BLOOD  Result Value Ref Range Status   Specimen Description BLOOD SITE NOT SPECIFIED  Final   Special Requests   Final    BOTTLES DRAWN AEROBIC ONLY Blood Culture results may not be optimal due to an inadequate volume of blood received in culture bottles   Culture   Final    NO GROWTH 2 DAYS Performed at [facility], [address], [place], [name] [address]    Report Status PENDING  Incomplete     Labs: BNP (last 3 results) Recent Labs    07/23/23 1630  BNP 125.8*   Basic Metabolic Panel: Recent Labs  Lab 08/20/23 1751 08/21/23 1556 08/22/23 0426 08/22/23 1713 08/23/23 1418 08/24/23 0430 08/25/23 1706 08/27/23 1436  NA 131*  --   --   --  131*  --  126* 128*  K 5.0  --   --   --  4.7  --  5.1 6.3*  CL 94*  --   --   --  91*  --  89* 89*  CO2 23  --   --   --  22  --  23 23  GLUCOSE 114*  --   --  406* 170*  --  290* 418*  BUN 94*  --   --   --  98*  --  81* 114*  CREATININE 6.70*  --   --   --  6.60*  --  6.21* 8.36*  CALCIUM 8.9  --   --   --  9.3  --  9.4 9.6  MG  --  2.8* 2.6*  --   --  3.1*  --   --   PHOS 4.6  --   --   --   --   --  4.9* 7.7*   Liver Function Tests: Recent Labs  Lab 08/20/23 1751 08/25/23 1706 08/27/23 1436  ALBUMIN 2.4* 2.8* 2.7*   No results for input(s): "LIPASE",  "AMYLASE" in the last 168 hours. No results for input(s): "AMMONIA" in the last 168 hours. CBC: Recent Labs  Lab 08/21/23 1556 08/21/23 1755 08/23/23 1418 08/25/23 1706 08/27/23 1436  WBC 21.8* 21.1* 9.9 8.8 8.0  HGB 9.9* 8.8* 9.7* 9.8* 9.5*  HCT 31.3* 27.9* 31.8* 31.2* 29.8*  MCV 92.1 92.7 92.7 91.2 93.1  PLT 159 186 206 176 186   Cardiac Enzymes: No results for input(s): "CKTOTAL", "CKMB", "CKMBINDEX", "TROPONINI" in the last 168 hours. BNP: Invalid input(s): "POCBNP" CBG: Recent Labs  Lab 08/26/23 2355 08/27/23 0636 08/27/23 0732 08/27/23 0842 08/27/23 1158  GLUCAP 170* 67* 65* 158* 314*   D-Dimer No results for input(s): "DDIMER" in the last 72 hours. Hgb A1c No results for input(s): "HGBA1C" in the last 72 hours. Lipid Profile No results for input(s): "CHOL", "HDL", "LDLCALC", "TRIG", "CHOLHDL", "LDLDIRECT" in the last 72 hours. Thyroid function studies No results for input(s): "TSH", "T4TOTAL", "T3FREE", "THYROIDAB" in the last 72 hours.  Invalid input(s): "FREET3" Anemia work up No results for input(s): "VITAMINB12", "FOLATE", "FERRITIN", "TIBC", "IRON", "RETICCTPCT" in the last 72 hours. Urinalysis    Component Value Date/Time   COLORURINE YELLOW 05/26/2015 1115   APPEARANCEUR CLEAR 05/26/2015 1115   APPEARANCEUR Hazy 07/30/2014 2303   LABSPEC 1.016 05/26/2015 1115   LABSPEC 1.012 07/30/2014 2303   PHURINE 8.0 05/26/2015 1115   GLUCOSEU 500 (A) 05/26/2015 1115   GLUCOSEU >=500 07/30/2014 2303   HGBUR MODERATE (A) 05/26/2015 1115   BILIRUBINUR NEGATIVE 05/26/2015 1115   BILIRUBINUR Negative 07/30/2014 2303   KETONESUR 15 (A) 05/26/2015 1115   PROTEINUR >300 (A) 05/26/2015 1115   UROBILINOGEN 0.2 05/26/2015 1115   NITRITE NEGATIVE 05/26/2015 1115   LEUKOCYTESUR NEGATIVE 05/26/2015 1115   LEUKOCYTESUR Negative 07/30/2014 2303   Sepsis Labs Recent Labs  Lab 08/21/23 1755 08/23/23 1418 08/25/23 1706 08/27/23 1436  WBC 21.1* 9.9 8.8 8.0    Microbiology Recent Results (from the past 240 hour(s))  Culture, blood (Routine X 2) w Reflex to ID Panel     Status: None (Preliminary result)   Collection Time: 08/24/23  6:52 PM   Specimen: BLOOD  Result Value Ref Range Status   Specimen Description BLOOD SITE NOT SPECIFIED  Final   Special Requests   Final    BOTTLES DRAWN AEROBIC AND ANAEROBIC Blood Culture results may not  be optimal due to an inadequate volume of blood received in culture bottles   Culture   Final    NO GROWTH 3 DAYS Performed at [facility], [address], [place], [name] [address]    Report Status PENDING  Incomplete  Culture, blood (Routine X 2) w Reflex to ID Panel     Status: None (Preliminary result)   Collection Time: 08/25/23  5:06 PM   Specimen: BLOOD  Result Value Ref Range Status   Specimen Description BLOOD SITE NOT SPECIFIED  Final   Special Requests   Final    BOTTLES DRAWN AEROBIC ONLY Blood Culture results may not be optimal due to an inadequate volume of blood received in culture bottles   Culture   Final    NO GROWTH 2 DAYS Performed at [facility], [address] [place], [name] [address]    Report Status PENDING  Incomplete     Time coordinating discharge:  45 minutes  SIGNED:   Dorcas Carrow, MD  [facility] 08/27/2023, 5:24 PM

## 2023-08-29 LAB — CULTURE, BLOOD (ROUTINE X 2): Culture: NO GROWTH

## 2023-08-30 LAB — CULTURE, BLOOD (ROUTINE X 2): Culture: NO GROWTH

## 2023-08-30 NOTE — Progress Notes (Signed)
Late Note Entry- Aug 30, 2023  Contacted [facility] this morning to advise clinic of pt's transfer to [facility]al Navigator [phone]

## 2023-09-09 ENCOUNTER — Inpatient Hospital Stay (HOSPITAL_COMMUNITY)
Admission: AD | Admit: 2023-09-09 | Discharge: 2023-11-24 | DRG: 314 | Disposition: A | Discharge status: Skilled Nursing Facility | Payer: Medicare HMO | Source: Ambulatory Visit | Attending: Family Medicine | Admitting: Family Medicine

## 2023-09-09 ENCOUNTER — Encounter (HOSPITAL_COMMUNITY): Payer: Self-pay

## 2023-09-09 DIAGNOSIS — Z89612 Acquired absence of left leg above knee: Secondary | ICD-10-CM

## 2023-09-09 DIAGNOSIS — L89322 Pressure ulcer of left buttock, stage 2: Secondary | ICD-10-CM | POA: Diagnosis not present

## 2023-09-09 DIAGNOSIS — T40605A Adverse effect of unspecified narcotics, initial encounter: Secondary | ICD-10-CM | POA: Diagnosis present

## 2023-09-09 DIAGNOSIS — Z992 Dependence on renal dialysis: Secondary | ICD-10-CM

## 2023-09-09 DIAGNOSIS — R160 Hepatomegaly, not elsewhere classified: Secondary | ICD-10-CM | POA: Diagnosis not present

## 2023-09-09 DIAGNOSIS — R4189 Other symptoms and signs involving cognitive functions and awareness: Secondary | ICD-10-CM | POA: Diagnosis present

## 2023-09-09 DIAGNOSIS — G9341 Metabolic encephalopathy: Secondary | ICD-10-CM | POA: Diagnosis not present

## 2023-09-09 DIAGNOSIS — Z86718 Personal history of other venous thrombosis and embolism: Secondary | ICD-10-CM

## 2023-09-09 DIAGNOSIS — E1052 Type 1 diabetes mellitus with diabetic peripheral angiopathy with gangrene: Secondary | ICD-10-CM | POA: Diagnosis present

## 2023-09-09 DIAGNOSIS — I081 Rheumatic disorders of both mitral and tricuspid valves: Secondary | ICD-10-CM | POA: Diagnosis present

## 2023-09-09 DIAGNOSIS — I517 Cardiomegaly: Secondary | ICD-10-CM | POA: Diagnosis not present

## 2023-09-09 DIAGNOSIS — F329 Major depressive disorder, single episode, unspecified: Secondary | ICD-10-CM | POA: Diagnosis present

## 2023-09-09 DIAGNOSIS — E1143 Type 2 diabetes mellitus with diabetic autonomic (poly)neuropathy: Secondary | ICD-10-CM | POA: Diagnosis present

## 2023-09-09 DIAGNOSIS — E43 Unspecified severe protein-calorie malnutrition: Secondary | ICD-10-CM | POA: Diagnosis not present

## 2023-09-09 DIAGNOSIS — E1065 Type 1 diabetes mellitus with hyperglycemia: Secondary | ICD-10-CM | POA: Diagnosis present

## 2023-09-09 DIAGNOSIS — M898X9 Other specified disorders of bone, unspecified site: Secondary | ICD-10-CM | POA: Diagnosis present

## 2023-09-09 DIAGNOSIS — E871 Hypo-osmolality and hyponatremia: Secondary | ICD-10-CM | POA: Diagnosis not present

## 2023-09-09 DIAGNOSIS — R7881 Bacteremia: Secondary | ICD-10-CM

## 2023-09-09 DIAGNOSIS — Z7401 Bed confinement status: Secondary | ICD-10-CM | POA: Diagnosis not present

## 2023-09-09 DIAGNOSIS — N250 Renal osteodystrophy: Secondary | ICD-10-CM | POA: Diagnosis not present

## 2023-09-09 DIAGNOSIS — I63411 Cerebral infarction due to embolism of right middle cerebral artery: Secondary | ICD-10-CM | POA: Diagnosis present

## 2023-09-09 DIAGNOSIS — E785 Hyperlipidemia, unspecified: Secondary | ICD-10-CM | POA: Diagnosis not present

## 2023-09-09 DIAGNOSIS — K649 Unspecified hemorrhoids: Secondary | ICD-10-CM | POA: Diagnosis present

## 2023-09-09 DIAGNOSIS — E10649 Type 1 diabetes mellitus with hypoglycemia without coma: Secondary | ICD-10-CM | POA: Diagnosis not present

## 2023-09-09 DIAGNOSIS — Z91119 Patient's noncompliance with dietary regimen due to unspecified reason: Secondary | ICD-10-CM

## 2023-09-09 DIAGNOSIS — N2581 Secondary hyperparathyroidism of renal origin: Secondary | ICD-10-CM | POA: Diagnosis not present

## 2023-09-09 DIAGNOSIS — Z743 Need for continuous supervision: Secondary | ICD-10-CM | POA: Diagnosis not present

## 2023-09-09 DIAGNOSIS — E109 Type 1 diabetes mellitus without complications: Secondary | ICD-10-CM | POA: Diagnosis not present

## 2023-09-09 DIAGNOSIS — Z794 Long term (current) use of insulin: Secondary | ICD-10-CM

## 2023-09-09 DIAGNOSIS — Z6834 Body mass index (BMI) 34.0-34.9, adult: Secondary | ICD-10-CM | POA: Diagnosis not present

## 2023-09-09 DIAGNOSIS — E1022 Type 1 diabetes mellitus with diabetic chronic kidney disease: Secondary | ICD-10-CM | POA: Diagnosis present

## 2023-09-09 DIAGNOSIS — Z91158 Patient's noncompliance with renal dialysis for other reason: Secondary | ICD-10-CM

## 2023-09-09 DIAGNOSIS — I69391 Dysphagia following cerebral infarction: Secondary | ICD-10-CM | POA: Diagnosis not present

## 2023-09-09 DIAGNOSIS — I76 Septic arterial embolism: Secondary | ICD-10-CM | POA: Diagnosis not present

## 2023-09-09 DIAGNOSIS — G8324 Monoplegia of upper limb affecting left nondominant side: Secondary | ICD-10-CM | POA: Diagnosis present

## 2023-09-09 DIAGNOSIS — I82A12 Acute embolism and thrombosis of left axillary vein: Secondary | ICD-10-CM | POA: Diagnosis present

## 2023-09-09 DIAGNOSIS — I96 Gangrene, not elsewhere classified: Secondary | ICD-10-CM | POA: Diagnosis present

## 2023-09-09 DIAGNOSIS — G0490 Encephalitis and encephalomyelitis, unspecified: Secondary | ICD-10-CM | POA: Diagnosis present

## 2023-09-09 DIAGNOSIS — E11628 Type 2 diabetes mellitus with other skin complications: Secondary | ICD-10-CM | POA: Diagnosis not present

## 2023-09-09 DIAGNOSIS — D631 Anemia in chronic kidney disease: Secondary | ICD-10-CM | POA: Diagnosis present

## 2023-09-09 DIAGNOSIS — N186 End stage renal disease: Secondary | ICD-10-CM | POA: Diagnosis present

## 2023-09-09 DIAGNOSIS — R279 Unspecified lack of coordination: Secondary | ICD-10-CM | POA: Diagnosis not present

## 2023-09-09 DIAGNOSIS — R404 Transient alteration of awareness: Secondary | ICD-10-CM | POA: Diagnosis not present

## 2023-09-09 DIAGNOSIS — R1311 Dysphagia, oral phase: Secondary | ICD-10-CM | POA: Diagnosis not present

## 2023-09-09 DIAGNOSIS — I82C12 Acute embolism and thrombosis of left internal jugular vein: Secondary | ICD-10-CM | POA: Diagnosis present

## 2023-09-09 DIAGNOSIS — Z89621 Acquired absence of right hip joint: Secondary | ICD-10-CM | POA: Diagnosis not present

## 2023-09-09 DIAGNOSIS — B9562 Methicillin resistant Staphylococcus aureus infection as the cause of diseases classified elsewhere: Secondary | ICD-10-CM | POA: Diagnosis present

## 2023-09-09 DIAGNOSIS — G479 Sleep disorder, unspecified: Secondary | ICD-10-CM | POA: Diagnosis present

## 2023-09-09 DIAGNOSIS — Z89512 Acquired absence of left leg below knee: Secondary | ICD-10-CM

## 2023-09-09 DIAGNOSIS — Z7901 Long term (current) use of anticoagulants: Secondary | ICD-10-CM

## 2023-09-09 DIAGNOSIS — I079 Rheumatic tricuspid valve disease, unspecified: Secondary | ICD-10-CM | POA: Diagnosis not present

## 2023-09-09 DIAGNOSIS — J90 Pleural effusion, not elsewhere classified: Secondary | ICD-10-CM | POA: Diagnosis not present

## 2023-09-09 DIAGNOSIS — Z8673 Personal history of transient ischemic attack (TIA), and cerebral infarction without residual deficits: Secondary | ICD-10-CM

## 2023-09-09 DIAGNOSIS — I6340 Cerebral infarction due to embolism of unspecified cerebral artery: Secondary | ICD-10-CM | POA: Diagnosis present

## 2023-09-09 DIAGNOSIS — I330 Acute and subacute infective endocarditis: Secondary | ICD-10-CM | POA: Diagnosis present

## 2023-09-09 DIAGNOSIS — K5903 Drug induced constipation: Secondary | ICD-10-CM | POA: Diagnosis present

## 2023-09-09 DIAGNOSIS — Z833 Family history of diabetes mellitus: Secondary | ICD-10-CM

## 2023-09-09 DIAGNOSIS — G060 Intracranial abscess and granuloma: Secondary | ICD-10-CM | POA: Diagnosis not present

## 2023-09-09 DIAGNOSIS — T80211A Bloodstream infection due to central venous catheter, initial encounter: Principal | ICD-10-CM | POA: Diagnosis present

## 2023-09-09 DIAGNOSIS — A4102 Sepsis due to Methicillin resistant Staphylococcus aureus: Secondary | ICD-10-CM | POA: Diagnosis not present

## 2023-09-09 DIAGNOSIS — M6281 Muscle weakness (generalized): Secondary | ICD-10-CM | POA: Diagnosis not present

## 2023-09-09 DIAGNOSIS — I120 Hypertensive chronic kidney disease with stage 5 chronic kidney disease or end stage renal disease: Secondary | ICD-10-CM | POA: Diagnosis not present

## 2023-09-09 DIAGNOSIS — Z0279 Encounter for issue of other medical certificate: Secondary | ICD-10-CM | POA: Diagnosis not present

## 2023-09-09 DIAGNOSIS — I871 Compression of vein: Secondary | ICD-10-CM | POA: Diagnosis present

## 2023-09-09 DIAGNOSIS — Z751 Person awaiting admission to adequate facility elsewhere: Secondary | ICD-10-CM

## 2023-09-09 DIAGNOSIS — Y848 Other medical procedures as the cause of abnormal reaction of the patient, or of later complication, without mention of misadventure at the time of the procedure: Secondary | ICD-10-CM | POA: Diagnosis not present

## 2023-09-09 DIAGNOSIS — D61818 Other pancytopenia: Secondary | ICD-10-CM | POA: Diagnosis not present

## 2023-09-09 DIAGNOSIS — L089 Local infection of the skin and subcutaneous tissue, unspecified: Secondary | ICD-10-CM | POA: Diagnosis present

## 2023-09-09 DIAGNOSIS — R41841 Cognitive communication deficit: Secondary | ICD-10-CM | POA: Diagnosis not present

## 2023-09-09 DIAGNOSIS — R29810 Facial weakness: Secondary | ICD-10-CM | POA: Diagnosis present

## 2023-09-09 DIAGNOSIS — I82622 Acute embolism and thrombosis of deep veins of left upper extremity: Secondary | ICD-10-CM | POA: Diagnosis present

## 2023-09-09 DIAGNOSIS — N412 Abscess of prostate: Secondary | ICD-10-CM | POA: Diagnosis present

## 2023-09-09 DIAGNOSIS — K3184 Gastroparesis: Secondary | ICD-10-CM | POA: Diagnosis present

## 2023-09-09 DIAGNOSIS — I63311 Cerebral infarction due to thrombosis of right middle cerebral artery: Secondary | ICD-10-CM | POA: Diagnosis not present

## 2023-09-09 DIAGNOSIS — I059 Rheumatic mitral valve disease, unspecified: Secondary | ICD-10-CM | POA: Diagnosis not present

## 2023-09-09 DIAGNOSIS — I38 Endocarditis, valve unspecified: Principal | ICD-10-CM | POA: Diagnosis present

## 2023-09-09 DIAGNOSIS — I10 Essential (primary) hypertension: Secondary | ICD-10-CM | POA: Diagnosis not present

## 2023-09-09 DIAGNOSIS — E875 Hyperkalemia: Secondary | ICD-10-CM | POA: Diagnosis not present

## 2023-09-09 DIAGNOSIS — R4182 Altered mental status, unspecified: Secondary | ICD-10-CM | POA: Diagnosis not present

## 2023-09-09 DIAGNOSIS — Z8679 Personal history of other diseases of the circulatory system: Secondary | ICD-10-CM | POA: Diagnosis not present

## 2023-09-09 DIAGNOSIS — R453 Demoralization and apathy: Secondary | ICD-10-CM | POA: Diagnosis present

## 2023-09-09 DIAGNOSIS — E10628 Type 1 diabetes mellitus with other skin complications: Secondary | ICD-10-CM | POA: Diagnosis present

## 2023-09-09 DIAGNOSIS — Z841 Family history of disorders of kidney and ureter: Secondary | ICD-10-CM

## 2023-09-09 DIAGNOSIS — I63443 Cerebral infarction due to embolism of bilateral cerebellar arteries: Secondary | ICD-10-CM | POA: Diagnosis present

## 2023-09-09 DIAGNOSIS — E66812 Obesity, class 2: Secondary | ICD-10-CM | POA: Diagnosis present

## 2023-09-09 DIAGNOSIS — Z79899 Other long term (current) drug therapy: Secondary | ICD-10-CM

## 2023-09-09 DIAGNOSIS — Z8661 Personal history of infections of the central nervous system: Secondary | ICD-10-CM

## 2023-09-09 DIAGNOSIS — D509 Iron deficiency anemia, unspecified: Secondary | ICD-10-CM | POA: Diagnosis not present

## 2023-09-09 DIAGNOSIS — I953 Hypotension of hemodialysis: Secondary | ICD-10-CM | POA: Diagnosis not present

## 2023-09-09 DIAGNOSIS — M7989 Other specified soft tissue disorders: Secondary | ICD-10-CM | POA: Diagnosis not present

## 2023-09-09 DIAGNOSIS — J9811 Atelectasis: Secondary | ICD-10-CM | POA: Diagnosis not present

## 2023-09-09 DIAGNOSIS — E1043 Type 1 diabetes mellitus with diabetic autonomic (poly)neuropathy: Secondary | ICD-10-CM | POA: Diagnosis present

## 2023-09-09 DIAGNOSIS — I82629 Acute embolism and thrombosis of deep veins of unspecified upper extremity: Secondary | ICD-10-CM | POA: Diagnosis not present

## 2023-09-09 LAB — COMPREHENSIVE METABOLIC PANEL
ALT: 12 U/L (ref 0–44)
AST: 15 U/L (ref 15–41)
Albumin: 2.9 g/dL — ABNORMAL LOW (ref 3.5–5.0)
Alkaline Phosphatase: 116 U/L (ref 38–126)
Anion gap: 13 (ref 5–15)
BUN: 27 mg/dL — ABNORMAL HIGH (ref 6–20)
CO2: 21 mmol/L — ABNORMAL LOW (ref 22–32)
Calcium: 9.7 mg/dL (ref 8.9–10.3)
Chloride: 101 mmol/L (ref 98–111)
Creatinine, Ser: 5.24 mg/dL — ABNORMAL HIGH (ref 0.61–1.24)
GFR, Estimated: 13 mL/min — ABNORMAL LOW (ref >60)
Glucose, Bld: 189 mg/dL — ABNORMAL HIGH (ref 70–99)
Potassium: 4.3 mmol/L (ref 3.5–5.1)
Sodium: 135 mmol/L (ref 135–145)
Total Bilirubin: 0.8 mg/dL (ref <1.2)
Total Protein: 7.6 g/dL (ref 6.5–8.1)

## 2023-09-09 LAB — CBC WITH DIFFERENTIAL/PLATELET
Abs Immature Granulocytes: 0.02 10*3/uL (ref 0.00–0.07)
Basophils Absolute: 0 10*3/uL (ref 0.0–0.1)
Basophils Relative: 1 %
Eosinophils Absolute: 0.3 10*3/uL (ref 0.0–0.5)
Eosinophils Relative: 6 %
HCT: 28.8 % — ABNORMAL LOW (ref 39.0–52.0)
Hemoglobin: 9 g/dL — ABNORMAL LOW (ref 13.0–17.0)
Immature Granulocytes: 0 %
Lymphocytes Relative: 19 %
Lymphs Abs: 1 10*3/uL (ref 0.7–4.0)
MCH: 29.2 pg (ref 26.0–34.0)
MCHC: 31.3 g/dL (ref 30.0–36.0)
MCV: 93.5 fL (ref 80.0–100.0)
Monocytes Absolute: 0.4 10*3/uL (ref 0.1–1.0)
Monocytes Relative: 8 %
Neutro Abs: 3.4 10*3/uL (ref 1.7–7.7)
Neutrophils Relative %: 66 %
Platelets: 175 10*3/uL (ref 150–400)
RBC: 3.08 MIL/uL — ABNORMAL LOW (ref 4.22–5.81)
RDW: 15.5 % (ref 11.5–15.5)
WBC: 5.2 10*3/uL (ref 4.0–10.5)
nRBC: 0 % (ref 0.0–0.2)

## 2023-09-09 LAB — GLUCOSE, CAPILLARY
Glucose-Capillary: 119 mg/dL — ABNORMAL HIGH (ref 70–99)
Glucose-Capillary: 358 mg/dL — ABNORMAL HIGH (ref 70–99)

## 2023-09-09 MED ORDER — VANCOMYCIN HCL 750 MG/150ML IV SOLN
750.0000 mg | INTRAVENOUS | Status: DC
  Administered 2023-09-10 – 2023-09-17 (×4): 750 mg via INTRAVENOUS
  Filled 2023-09-09 (×6): qty 150

## 2023-09-09 MED ORDER — INSULIN ASPART 100 UNIT/ML IJ SOLN
5.0000 [IU] | Freq: Once | INTRAMUSCULAR | Status: AC
  Administered 2023-09-09: 5 [IU] via SUBCUTANEOUS

## 2023-09-09 MED ORDER — METOPROLOL TARTRATE 25 MG PO TABS
25.0000 mg | ORAL_TABLET | Freq: Two times a day (BID) | ORAL | Status: DC
  Administered 2023-09-09 – 2023-09-23 (×22): 25 mg via ORAL
  Filled 2023-09-09 (×29): qty 1

## 2023-09-09 MED ORDER — CALCITRIOL 0.5 MCG PO CAPS
1.5000 ug | ORAL_CAPSULE | ORAL | Status: DC
Start: 2023-09-10 — End: 2023-10-07
  Administered 2023-09-10 – 2023-10-06 (×12): 1.5 ug via ORAL
  Filled 2023-09-09 (×11): qty 3

## 2023-09-09 MED ORDER — GABAPENTIN 100 MG PO CAPS
100.0000 mg | ORAL_CAPSULE | Freq: Three times a day (TID) | ORAL | Status: DC
  Administered 2023-09-09 – 2023-11-24 (×204): 100 mg via ORAL
  Filled 2023-09-09 (×213): qty 1

## 2023-09-09 MED ORDER — QUETIAPINE FUMARATE 25 MG PO TABS
25.0000 mg | ORAL_TABLET | Freq: Every day | ORAL | Status: DC
  Administered 2023-09-10 – 2023-11-23 (×75): 25 mg via ORAL
  Filled 2023-09-09 (×17): qty 1
  Filled 2023-09-09: qty 2
  Filled 2023-09-09 (×5): qty 1
  Filled 2023-09-09: qty 2
  Filled 2023-09-09 (×6): qty 1
  Filled 2023-09-09: qty 2
  Filled 2023-09-09 (×14): qty 1
  Filled 2023-09-09: qty 2
  Filled 2023-09-09 (×36): qty 1

## 2023-09-09 MED ORDER — ACETAMINOPHEN 325 MG PO TABS
650.0000 mg | ORAL_TABLET | ORAL | Status: DC | PRN
  Administered 2023-09-10 – 2023-11-08 (×81): 650 mg via ORAL
  Administered 2023-11-08: 325 mg via ORAL
  Administered 2023-11-09 – 2023-11-10 (×7): 650 mg via ORAL
  Administered 2023-11-11 (×2): 325 mg via ORAL
  Administered 2023-11-14 – 2023-11-19 (×8): 650 mg via ORAL
  Administered 2023-11-20: 325 mg via ORAL
  Administered 2023-11-21 – 2023-11-24 (×5): 650 mg via ORAL
  Filled 2023-09-09 (×120): qty 2

## 2023-09-09 MED ORDER — LINEZOLID 600 MG PO TABS
600.0000 mg | ORAL_TABLET | Freq: Two times a day (BID) | ORAL | Status: DC
  Administered 2023-09-10: 600 mg via ORAL
  Filled 2023-09-09 (×4): qty 1

## 2023-09-09 MED ORDER — OXYCODONE HCL 5 MG PO TABS
5.0000 mg | ORAL_TABLET | Freq: Four times a day (QID) | ORAL | Status: AC | PRN
  Administered 2023-09-09 – 2023-09-10 (×2): 5 mg via ORAL
  Filled 2023-09-09 (×2): qty 1

## 2023-09-09 MED ORDER — INSULIN ASPART 100 UNIT/ML IJ SOLN
0.0000 [IU] | Freq: Three times a day (TID) | INTRAMUSCULAR | Status: DC
  Administered 2023-09-10: 2 [IU] via SUBCUTANEOUS

## 2023-09-09 MED ORDER — QUETIAPINE FUMARATE 25 MG PO TABS
12.5000 mg | ORAL_TABLET | Freq: Every morning | ORAL | Status: DC
  Administered 2023-09-10 – 2023-11-24 (×74): 12.5 mg via ORAL
  Filled 2023-09-09 (×79): qty 1

## 2023-09-09 MED ORDER — INSULIN GLARGINE-YFGN 100 UNIT/ML ~~LOC~~ SOLN
4.0000 [IU] | Freq: Every day | SUBCUTANEOUS | Status: DC
  Administered 2023-09-10: 4 [IU] via SUBCUTANEOUS
  Filled 2023-09-09: qty 0.04

## 2023-09-09 MED ORDER — PANTOPRAZOLE SODIUM 40 MG PO TBEC
40.0000 mg | DELAYED_RELEASE_TABLET | Freq: Every day | ORAL | Status: DC
  Administered 2023-09-10 – 2023-11-24 (×73): 40 mg via ORAL
  Filled 2023-09-09 (×75): qty 1

## 2023-09-09 MED ORDER — SEVELAMER CARBONATE 800 MG PO TABS
800.0000 mg | ORAL_TABLET | Freq: Three times a day (TID) | ORAL | Status: DC
  Administered 2023-09-10 – 2023-09-23 (×36): 800 mg via ORAL
  Filled 2023-09-09 (×38): qty 1

## 2023-09-09 MED ORDER — APIXABAN 5 MG PO TABS
5.0000 mg | ORAL_TABLET | Freq: Two times a day (BID) | ORAL | Status: DC
  Administered 2023-09-09 – 2023-11-24 (×149): 5 mg via ORAL
  Filled 2023-09-09 (×153): qty 1

## 2023-09-09 NOTE — Progress Notes (Signed)
Pharmacy Antibiotic Note  [name] is a 42 y.o. 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 Pt was transferred to [facility] for MV repair 11/29 and craniotomy with abscess drainage on 12/4. Pharmacy has been consulted for continued Vancomycin dosing.  Spoke with pharmacist at [facility], vancomycin was being dose based on pre-HD levels - pt mostly getting 750mg  with HD but occasionally would get 500mg  instead. Last vanc random (pre-HD level) at [facility] of 19.8 mcg/ml on 12/11 (therapeutic for goal 15-25 mcg/ml) and pt received vanc 750mg  IV 12/11 pm post HD    Plan: - Vancomycin 750 mg IV post HD MWF  - Will monitor HD tolerance and dosing - Consider random level again on Monday - F/u ID recommendations     Temp (24hrs), Avg:98.5 F (36.9 C), Min:98.2 F (36.8 C), Max:98.8 F (37.1 C)  No results for input(s): "WBC", "CREATININE", "LATICACIDVEN", "VANCOTROUGH", "VANCOPEAK", "VANCORANDOM", "GENTTROUGH", "GENTPEAK", "GENTRANDOM", "TOBRATROUGH", "TOBRAPEAK", "TOBRARND", "AMIKACINPEAK", "AMIKACINTROU", "AMIKACIN" in the last 168 hours.  CrCl cannot be calculated (Unknown ideal weight.).    No Known Allergies  Antimicrobials this admission: Vancomycin 10/12 >> Cefepime 10/21 >> 10/23; restart 10/25 >> 10/29 Linezolid 11/27 >> 12/10   Vancomycin levels  -10/17 preHD>>25 -10/24: VR= 16 -11/1: VR= 24 -11/8 VR 22 -11/15 VR 22  -11/22 VR 28 - held dose x1, decrease to 500mg  -11/28 VR 18 - cont 500mg  MWF 11/29 - Transferred to [facility] -12/11 VR 19.8 (at [facility]) - mostly 750mg  with HD (occasionally 500mg  instead)   Microbiology results: 10/12 MRSA PCR >> positive 10/12 CDiff antigen +, toxin -, PCR - >> negative 10/12 BCx >> 4/4 MRSA 10/13 BCx >> Negative 10/17 BCx >> Negative 10/21 RCx >> Negative 11/26 Bcx >> negative 11/27 Bcx >> Negative  Thank you for allowing pharmacy to be a part of this patient's care.  Christoper Fabian,  PharmD, BCPS Please see amion for complete clinical pharmacist phone list 09/09/2023 8:08 PM

## 2023-09-09 NOTE — H&P (Signed)
History and Physical    Patient: [name] DOB: 24-Dec-1980 DOA: 09/09/2023 DOS: the patient was seen and examined on 09/09/2023 PCP: [name], NP  Patient coming from: [facility]   Chief Complaint: No chief complaint on file.  HPI: [name] is a 42 y.o. 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  While at [facility] he underwent cardiac cath showing non obstructive disease, craniotomy with abscess drainage on 09/01/23,. He had subarachnoid hemorrhage while on IV heparin, right third toe distal necrosis,was seen by podiatry, and family wanted conservative management.  Since his tertiary care needs have been completed, he was transferred back to [facility] for further management.  Currently he denies any pain and was very focused on his meal selection. No family at bedside, and history was minimal.   Review of Systems: unable to review all systems due to the inability of the patient to answer questions. Past Medical History:  Diagnosis Date   Anemia    Cellulitis and abscess of toe of left foot    Diabetes mellitus without complication ([name])    Diabetic gastroparesis ([name])    Diabetic ulcer of left midfoot associated with diabetes mellitus due to underlying condition, with necrosis of bone [facility])    Dialysis patient ([name])    ESRD (end stage renal disease) ([name])    Hypertension    Sepsis ([name])    Past Surgical History:  Procedure Laterality Date   AMPUTATION Right 02/02/2018   Procedure: RIGHT FIFTH TOE AND METATARSAL AMPUTATION. Filetted toe flap metatarsal resection.  Debridement Plantar Foot wound;  Surgeon: Park Liter, DPM;  Location: [facility] OR;  Service: Podiatry;  Laterality: Right;   AMPUTATION Left 08/20/2018   Procedure: FIFTH METATARSAL BONE BIOPSY;  Surgeon: Park Liter, DPM;  Location: [facility] OR;  Service: Podiatry;  Laterality: Left;   AMPUTATION Left 10/28/2018   Procedure: LEFT GREAT TOE AMPUTATION;  Surgeon: Park Liter, DPM;  Location: [facility] OR;  Service: Podiatry;  Laterality: Left;   AMPUTATION Left 09/16/2020   Procedure: AMPUTATION BELOW KNEE;  Surgeon: Larina Earthly, MD;  Location: [facility] OR;  Service: Vascular;  Laterality: Left;   AMPUTATION Left 10/15/2020   Procedure: AMPUTATION ABOVE KNEE;  Surgeon: Sherren Kerns, MD;  Location: [facility] OR;  Service: Vascular;  Laterality: Left;   APPLICATION OF WOUND VAC  02/02/2018   Procedure: APPLICATION OF WOUND VAC  Right Foot;  Surgeon: Park Liter, DPM;  Location: [facility] OR;  Service: Podiatry;;   APPLICATION OF WOUND VAC Left 10/28/2018   Procedure: APPLICATION OF WOUND VAC LEFT TOE;  Surgeon: Park Liter, DPM;  Location: [facility] OR;  Service: Podiatry;  Laterality: Left;   APPLICATION OF WOUND VAC Left 11/01/2018   Procedure: APPLICATION OF WOUND VAC;  Surgeon: Park Liter, DPM;  Location: [facility] OR;  Service: Podiatry;  Laterality: Left;   AV FISTULA PLACEMENT     left arm.   AV FISTULA PLACEMENT Right 12/22/2016   Procedure: INSERTION OF ARTERIOVENOUS (AV) GORE-TEX GRAFT ARM;  Surgeon: Sherren Kerns, MD;  Location: [facility] OR;  Service: Vascular;  Laterality: Right;  AV FISTULA PLACEMENT Left 05/26/2018   Procedure: INSERTION OF  ARTERIOVENOUS (AV) GORE-TEX GRAFT LEFT ARM;  Surgeon: Nada Libman, MD;  Location: [facility] OR;  Service: Vascular;  Laterality: Left;   EYE SURGERY     I & D EXTREMITY Right 10/31/2017   Procedure: IRRIGATION AND DEBRIDEMENT RIGHT FOOT;  Surgeon: Park Liter, DPM;  Location: [facility] OR;  Service: Podiatry;  Laterality: Right;   I & D EXTREMITY Left 08/20/2018    Procedure: IRRIGATION AND DEBRIDEMENT EXTREMITY WITH SECONDARY WOUND CLOSUREAND APPLICATION OF WOUND VAC LEFT FOOT;  Surgeon: Park Liter, DPM;  Location: [facility] OR;  Service: Podiatry;  Laterality: Left;   I & D EXTREMITY Left 10/20/2018   Procedure: IRRIGATION AND DEBRIDEMENT LEFT FOOT  DEBRIDEMENT LATERAL FOOT WOUND;  Surgeon: Park Liter, DPM;  Location: [facility] OR;  Service: Podiatry;  Laterality: Left;   I & D EXTREMITY Left 10/28/2018   Procedure: IRRIGATION AND DEBRIDEMENT LEFT TOE;  Surgeon: Park Liter, DPM;  Location: [facility] OR;  Service: Podiatry;  Laterality: Left;   I & D EXTREMITY Left 09/14/2020   Procedure: IRRIGATION AND DEBRIDEMENT WRIST;  Surgeon: Betha Loa, MD;  Location: [facility] OR;  Service: Orthopedics;  Laterality: Left;   INSERTION OF DIALYSIS CATHETER     Right subclavian   IR AV DIALY SHUNT INTRO NEEDLE/INTRACATH INITIAL W/PTA/IMG RIGHT Right 02/05/2018   IR FLUORO GUIDE CV LINE LEFT  04/14/2023   IR FLUORO GUIDE CV LINE LEFT  07/16/2023   IR FLUORO GUIDE CV LINE RIGHT  01/31/2020   IR FLUORO GUIDE CV LINE RIGHT  01/30/2022   IR PTA VENOUS EXCEPT DIALYSIS CIRCUIT  01/30/2022   IR REMOVAL TUN CV CATH W/O FL  04/12/2023   IR THROMBECTOMY AV FISTULA W/THROMBOLYSIS/PTA INC/SHUNT/IMG LEFT Left 08/24/2018   IR THROMBECTOMY AV FISTULA W/THROMBOLYSIS/PTA INC/SHUNT/IMG LEFT Left 01/06/2019   IR US GUIDE VASC ACCESS LEFT  08/24/2018   IR US GUIDE VASC ACCESS LEFT  04/14/2023   IR US GUIDE VASC ACCESS LEFT  07/16/2023   IR US GUIDE VASC ACCESS LEFT  07/16/2023   IR US GUIDE VASC ACCESS RIGHT  02/05/2018   IR US GUIDE VASC ACCESS RIGHT  01/31/2020   IR US GUIDE VASC ACCESS RIGHT  07/16/2023   IR VENO/JUGULAR LEFT  07/16/2023   IR VENO/JUGULAR RIGHT  07/16/2023   IR VENOCAVAGRAM SVC  01/30/2022   IRRIGATION AND DEBRIDEMENT FOOT Right 10/23/2018   Procedure: Irrigation and Debridement to tendon, Left Foot;  Surgeon: Park Liter, DPM;  Location: [facility] OR;  Service: Podiatry;  Laterality:  Right;   IRRIGATION AND DEBRIDEMENT FOOT Left 11/01/2018   Procedure: IRRIGATION AND DEBRIDEMENT PARTIAL WOUND CLOSURE LOCAL TISSUE TRANSFER AND FLAP ROTATION, LEFT FOOT;  Surgeon: Park Liter, DPM;  Location: [facility] OR;  Service: Podiatry;  Laterality: Left;   TRANSESOPHAGEAL ECHOCARDIOGRAM (CATH LAB) N/A 07/14/2023   Procedure: TRANSESOPHAGEAL ECHOCARDIOGRAM;  Surgeon: Thomasene Ripple, DO;  Location: [facility] INVASIVE CV LAB;  Service: Cardiovascular;  Laterality: N/A;   TRANSMETATARSAL AMPUTATION N/A 08/18/2018   Procedure: IRRIGATION AND DEBRIDEMENT OF LEFT 5TH TOE AND TRANSMETATARSAL, WITH PARTICAL LEFT 5TH TOE AND METATARSAL AMPUTATION, BONE BIOPSY, WOUND VAC APPLICATION.;  Surgeon: Park Liter, DPM;  Location: [facility] OR;  Service: Podiatry;  Laterality: N/A;   UPPER EXTREMITY VENOGRAPHY N/A 11/16/2016   Procedure: Upper Extremity Venography - Right Central;  Surgeon: Sherren Kerns, MD;  Location: [facility] INVASIVE CV LAB;  Service: Cardiovascular;  Laterality: N/A;  UPPER EXTREMITY VENOGRAPHY N/A 05/25/2018   Procedure: UPPER EXTREMITY VENOGRAPHY - Bilateral;  Surgeon: Cephus Shelling, MD;  Location: [facility] INVASIVE CV LAB;  Service: Cardiovascular;  Laterality: N/A;   Social History:  reports that he has never smoked. He has never used smokeless tobacco. He reports that he does not drink alcohol and does not use drugs.  No Known Allergies  Family History  Problem Relation Age of Onset   Diabetes Mellitus II Other    Diabetes Father    Renal Disease Father        ESRD    Prior to Admission medications   Medication Sig Start Date End Date Taking? Authorizing Provider  acetaminophen (TYLENOL) 325 MG tablet Take 2 tablets (650 mg total) by mouth every 6 (six) hours as needed for mild pain (pain score 1-3), fever, moderate pain (pain score 4-6) or headache. 08/27/23  Yes Dorcas Carrow, MD  apixaban (ELIQUIS) 5 MG TABS tablet Take 1 tablet (5 mg total) by mouth 2 (two) times daily. 08/27/23  Yes  Dorcas Carrow, MD  calcitRIOL (ROCALTROL) 0.5 MCG capsule Take 3 capsules (1.5 mcg total) by mouth every Monday, Wednesday, and Friday with hemodialysis. 05/23/21  Yes Kathlen Mody, MD  clonazePAM (KLONOPIN) 0.5 MG tablet Take 0.25 mg by mouth 2 (two) times daily as needed for anxiety. 09/09/23 12/08/23 Yes [provider]  gabapentin (NEURONTIN) 100 MG capsule Take 100 mg by mouth 3 (three) times daily.   Yes [provider]  insulin glargine (LANTUS) 100 UNIT/ML injection Inject 4 Units into the skin in the morning. 09/10/23 12/09/23 Yes [provider]  insulin lispro (HUMALOG) 100 UNIT/ML injection Inject 0-4 Units into the skin 3 (three) times daily with meals. BG Less Than 70: Follow hypoglycemia instructions BG 70 - 179: No dose needed, BG 180 - 200: 0, BG 201 - 250: 1, BG 251 - 300: 2, BG 301 - 350: 3, BG 351 - 400: 4, BG Greater Than 400: Call Provider 09/09/23 12/09/23 Yes [provider]  linezolid (ZYVOX) 600 MG tablet Take 1 tablet (600 mg total) by mouth every 12 (twelve) hours for 13 days. 08/27/23 09/09/23 Yes Dorcas Carrow, MD  metoprolol tartrate (LOPRESSOR) 25 MG tablet Take 25 mg by mouth 2 (two) times daily.   Yes [provider]  omeprazole (PRILOSEC) 20 MG capsule Take 20 mg by mouth daily. 09/10/23 12/09/23 Yes [provider]  ondansetron (ZOFRAN-ODT) 4 MG disintegrating tablet Take 4 mg by mouth every 6 (six) hours as needed for nausea or vomiting. 09/09/23 12/08/23 Yes [provider]  oxyCODONE (OXY IR/ROXICODONE) 5 MG immediate release tablet Take 5 mg by mouth every 4 (four) hours as needed for severe pain (pain score 7-10). 09/09/23 09/14/23 Yes [provider]  QUEtiapine (SEROQUEL) 25 MG tablet Take 12.5-25 mg by mouth 2 (two) times daily. Take 0.5 tablets (12.5 mg total) by mouth every morning AND 1 tablet (25 mg total) at bedtime. 09/09/23 12/08/23 Yes [provider]  sevelamer (RENAGEL) 800  MG tablet Take 800 mg by mouth 3 (three) times daily with meals. 09/09/23 12/08/23 Yes [provider]  vancomycin (VANCOREADY) 500 MG/100ML IVPB Inject 100 mLs (500 mg total) into the vein every Monday, Wednesday, and Friday with hemodialysis. 08/27/23  Yes Dorcas Carrow, MD  albuterol (PROVENTIL) (2.5 MG/3ML) 0.083% nebulizer solution Take 3 mLs (2.5 mg total) by nebulization every 6 (six) hours as needed for wheezing or shortness of breath. Patient not taking: Reported on 09/09/2023  08/27/23   Dorcas Carrow, MD    Physical Exam: Vitals:   09/09/23 1700  BP: (!) 144/97  Pulse: 94  Resp: 18  Temp: 98.2 F (36.8 C)  TempSrc: Oral  SpO2: 100%   General exam: Appears calm and comfortable  Respiratory system: Clear to auscultation. Respiratory effort normal. Cardiovascular system: S1 & S2 heard, RRR.  Gastrointestinal system: Abdomen is nondistended, soft and non tender.  Central nervous system: Alert and oriented to person and place.  Extremities: left AKA, right 3 rd toe necrosis.  Skin: No rashes,  Psychiatry:  mood Is appropriate.   Data Reviewed: Results for orders placed or performed during the hospital encounter of 09/09/23 (from the past 24 hours)  Glucose, capillary     Status: Abnormal   Collection Time: 09/09/23  5:39 PM  Result Value Ref Range   Glucose-Capillary 119 (H) 70 - 99 mg/dL    Assessment and Plan:  MRSA MV /TV endocarditis Continue with vancomycin as per pharmacy. .  ID will be consulted in am.  EOT is 1/29    Brain abscess s/p craniotomy and abscess drainage on 12/4 From MRSA. On IV vancomycin.   ESRD on HD MWF Nephrology will be consulted.    Type 1 DM insulin dependent.  CBG (last 3)  Recent Labs    09/09/23 1739  GLUCAP 119*   Resume semglee and SSI.  Patient adamant about being on regular diet.    Gastroparesis  Resume PPI.   Left  internal jugular DVT Resume eliquis.    Subarachnoid hemorrhage while on Heparin.   Monitor.    Right third toe necrosis Was seen by podiatry at [facility] recommended surgical intervention, but family opted for conservative management with IV antibiotics   Prostate fluid collection.  Dilated Seminal vesicle and no surgical intervention needed.    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  Monitor.     Advance Care Planning:   Code Status: Full Code   Consults: nephrology and ID will be consulted in am.   Family Communication: None at bedside.   Severity of Illness: The appropriate patient status for this patient is INPATIENT. Inpatient status is judged to be reasonable and necessary in order to provide the required intensity of service to ensure the patient's safety. The patient's presenting symptoms, physical exam findings, and initial radiographic and laboratory data in the context of their chronic comorbidities is felt to place them at high risk for further clinical deterioration. Furthermore, it is not anticipated that the patient will be medically stable for discharge from the hospital within 2 midnights of admission.   * I certify that at the point of admission it is my clinical judgment that the patient will require inpatient hospital care spanning beyond 2 midnights from the point of admission due to high intensity of service, high risk for further deterioration and high frequency of surveillance required.*  Author: Kathlen Mody, MD 09/09/2023 7:34 PM  For on call review [email or web address].

## 2023-09-10 ENCOUNTER — Encounter (HOSPITAL_COMMUNITY): Payer: Self-pay | Admitting: Internal Medicine

## 2023-09-10 DIAGNOSIS — I120 Hypertensive chronic kidney disease with stage 5 chronic kidney disease or end stage renal disease: Secondary | ICD-10-CM | POA: Diagnosis not present

## 2023-09-10 DIAGNOSIS — I059 Rheumatic mitral valve disease, unspecified: Secondary | ICD-10-CM | POA: Diagnosis not present

## 2023-09-10 DIAGNOSIS — L089 Local infection of the skin and subcutaneous tissue, unspecified: Secondary | ICD-10-CM | POA: Diagnosis not present

## 2023-09-10 DIAGNOSIS — I38 Endocarditis, valve unspecified: Secondary | ICD-10-CM | POA: Diagnosis not present

## 2023-09-10 DIAGNOSIS — E1022 Type 1 diabetes mellitus with diabetic chronic kidney disease: Secondary | ICD-10-CM

## 2023-09-10 DIAGNOSIS — D631 Anemia in chronic kidney disease: Secondary | ICD-10-CM | POA: Diagnosis not present

## 2023-09-10 DIAGNOSIS — I330 Acute and subacute infective endocarditis: Secondary | ICD-10-CM | POA: Diagnosis not present

## 2023-09-10 DIAGNOSIS — N250 Renal osteodystrophy: Secondary | ICD-10-CM | POA: Diagnosis not present

## 2023-09-10 DIAGNOSIS — I6340 Cerebral infarction due to embolism of unspecified cerebral artery: Secondary | ICD-10-CM | POA: Diagnosis not present

## 2023-09-10 DIAGNOSIS — K3184 Gastroparesis: Secondary | ICD-10-CM | POA: Diagnosis not present

## 2023-09-10 DIAGNOSIS — I079 Rheumatic tricuspid valve disease, unspecified: Secondary | ICD-10-CM | POA: Diagnosis not present

## 2023-09-10 DIAGNOSIS — N186 End stage renal disease: Secondary | ICD-10-CM | POA: Diagnosis not present

## 2023-09-10 DIAGNOSIS — R7881 Bacteremia: Secondary | ICD-10-CM | POA: Diagnosis not present

## 2023-09-10 DIAGNOSIS — Z992 Dependence on renal dialysis: Secondary | ICD-10-CM | POA: Diagnosis not present

## 2023-09-10 DIAGNOSIS — B9562 Methicillin resistant Staphylococcus aureus infection as the cause of diseases classified elsewhere: Secondary | ICD-10-CM | POA: Diagnosis not present

## 2023-09-10 LAB — CBC WITH DIFFERENTIAL/PLATELET
Abs Immature Granulocytes: 0.01 10*3/uL (ref 0.00–0.07)
Basophils Absolute: 0 10*3/uL (ref 0.0–0.1)
Basophils Relative: 1 %
Eosinophils Absolute: 0.3 10*3/uL (ref 0.0–0.5)
Eosinophils Relative: 6 %
HCT: 27.7 % — ABNORMAL LOW (ref 39.0–52.0)
Hemoglobin: 8.4 g/dL — ABNORMAL LOW (ref 13.0–17.0)
Immature Granulocytes: 0 %
Lymphocytes Relative: 21 %
Lymphs Abs: 1 10*3/uL (ref 0.7–4.0)
MCH: 28.8 pg (ref 26.0–34.0)
MCHC: 30.3 g/dL (ref 30.0–36.0)
MCV: 94.9 fL (ref 80.0–100.0)
Monocytes Absolute: 0.4 10*3/uL (ref 0.1–1.0)
Monocytes Relative: 8 %
Neutro Abs: 3.1 10*3/uL (ref 1.7–7.7)
Neutrophils Relative %: 64 %
Platelets: 192 10*3/uL (ref 150–400)
RBC: 2.92 MIL/uL — ABNORMAL LOW (ref 4.22–5.81)
RDW: 15.5 % (ref 11.5–15.5)
WBC: 4.8 10*3/uL (ref 4.0–10.5)
nRBC: 0 % (ref 0.0–0.2)

## 2023-09-10 LAB — BASIC METABOLIC PANEL
Anion gap: 13 (ref 5–15)
BUN: 35 mg/dL — ABNORMAL HIGH (ref 6–20)
CO2: 24 mmol/L (ref 22–32)
Calcium: 9.5 mg/dL (ref 8.9–10.3)
Chloride: 101 mmol/L (ref 98–111)
Creatinine, Ser: 6.82 mg/dL — ABNORMAL HIGH (ref 0.61–1.24)
GFR, Estimated: 10 mL/min — ABNORMAL LOW (ref >60)
Glucose, Bld: 319 mg/dL — ABNORMAL HIGH (ref 70–99)
Potassium: 4.8 mmol/L (ref 3.5–5.1)
Sodium: 138 mmol/L (ref 135–145)

## 2023-09-10 LAB — GLUCOSE, CAPILLARY
Glucose-Capillary: 204 mg/dL — ABNORMAL HIGH (ref 70–99)
Glucose-Capillary: 342 mg/dL — ABNORMAL HIGH (ref 70–99)
Glucose-Capillary: 110 mg/dL — ABNORMAL HIGH (ref 70–99)
Glucose-Capillary: 237 mg/dL — ABNORMAL HIGH (ref 70–99)

## 2023-09-10 LAB — PHOSPHORUS: Phosphorus: 5.9 mg/dL — ABNORMAL HIGH (ref 2.5–4.6)

## 2023-09-10 MED ORDER — PENTAFLUOROPROP-TETRAFLUOROETH EX AERO: 1.0000 | INHALATION_SPRAY | CUTANEOUS | Status: DC | PRN

## 2023-09-10 MED ORDER — OXYCODONE HCL 5 MG PO TABS
5.0000 mg | ORAL_TABLET | Freq: Four times a day (QID) | ORAL | Status: DC | PRN
  Administered 2023-09-10 – 2023-09-15 (×12): 5 mg via ORAL
  Filled 2023-09-10 (×15): qty 1

## 2023-09-10 MED ORDER — INSULIN ASPART 100 UNIT/ML IJ SOLN
2.0000 [IU] | Freq: Three times a day (TID) | INTRAMUSCULAR | Status: DC
  Administered 2023-09-11 – 2023-09-16 (×14): 2 [IU] via SUBCUTANEOUS

## 2023-09-10 MED ORDER — CHLORHEXIDINE GLUCONATE CLOTH 2 % EX PADS
6.0000 | MEDICATED_PAD | Freq: Every day | CUTANEOUS | Status: DC
  Administered 2023-09-10 – 2023-09-18 (×6): 6 via TOPICAL

## 2023-09-10 MED ORDER — ALTEPLASE 2 MG IJ SOLR: 2.0000 mg | Freq: Once | INTRAMUSCULAR | Status: DC | PRN

## 2023-09-10 MED ORDER — NEPRO/CARBSTEADY PO LIQD: 237.0000 mL | ORAL | Status: DC | PRN

## 2023-09-10 MED ORDER — ANTICOAGULANT SODIUM CITRATE 4% (200MG/5ML) IV SOLN: 5.0000 mL | Status: DC | PRN

## 2023-09-10 MED ORDER — INSULIN ASPART 100 UNIT/ML IJ SOLN
0.0000 [IU] | Freq: Three times a day (TID) | INTRAMUSCULAR | Status: DC
  Administered 2023-09-10 – 2023-09-11 (×2): 7 [IU] via SUBCUTANEOUS
  Administered 2023-09-11: 9 [IU] via SUBCUTANEOUS
  Administered 2023-09-12 – 2023-09-13 (×4): 3 [IU] via SUBCUTANEOUS
  Administered 2023-09-14: 2 [IU] via SUBCUTANEOUS
  Administered 2023-09-14: 5 [IU] via SUBCUTANEOUS
  Administered 2023-09-14: 2 [IU] via SUBCUTANEOUS
  Administered 2023-09-15: 7 [IU] via SUBCUTANEOUS
  Administered 2023-09-16: 5 [IU] via SUBCUTANEOUS
  Administered 2023-09-16: 7 [IU] via SUBCUTANEOUS
  Administered 2023-09-16: 2 [IU] via SUBCUTANEOUS
  Administered 2023-09-17 – 2023-09-18 (×2): 9 [IU] via SUBCUTANEOUS

## 2023-09-10 MED ORDER — HEPARIN SODIUM (PORCINE) 1000 UNIT/ML DIALYSIS
1000.0000 [IU] | INTRAMUSCULAR | Status: DC | PRN
  Administered 2023-09-10: 1000 [IU]
  Filled 2023-09-10: qty 1

## 2023-09-10 MED ORDER — DIPHENHYDRAMINE HCL 25 MG PO CAPS
25.0000 mg | ORAL_CAPSULE | Freq: Once | ORAL | Status: AC
  Administered 2023-09-10: 25 mg via ORAL
  Filled 2023-09-10: qty 1

## 2023-09-10 MED ORDER — HEPARIN SODIUM (PORCINE) 1000 UNIT/ML IJ SOLN
3200.0000 [IU] | Freq: Once | INTRAMUSCULAR | Status: AC
  Administered 2023-09-10: 3200 [IU]

## 2023-09-10 MED ORDER — LIDOCAINE-PRILOCAINE 2.5-2.5 % EX CREA: 1.0000 | TOPICAL_CREAM | CUTANEOUS | Status: DC | PRN

## 2023-09-10 MED ORDER — HEPARIN SODIUM (PORCINE) 1000 UNIT/ML IJ SOLN
4600.0000 [IU] | Freq: Once | INTRAMUSCULAR | Status: AC
  Administered 2023-09-13: 4200 [IU]
  Filled 2023-09-10: qty 5
  Filled 2023-09-10: qty 4.6

## 2023-09-10 MED ORDER — HYDRALAZINE HCL 20 MG/ML IJ SOLN: 10.0000 mg | Freq: Four times a day (QID) | INTRAMUSCULAR | Status: DC | PRN

## 2023-09-10 MED ORDER — LIDOCAINE HCL (PF) 1 % IJ SOLN: 5.0000 mL | INTRAMUSCULAR | Status: DC | PRN

## 2023-09-10 MED ORDER — CHLORHEXIDINE GLUCONATE CLOTH 2 % EX PADS
6.0000 | MEDICATED_PAD | Freq: Every day | CUTANEOUS | Status: DC
  Administered 2023-09-13 – 2023-09-16 (×3): 6 via TOPICAL

## 2023-09-10 MED ORDER — INSULIN GLARGINE-YFGN 100 UNIT/ML ~~LOC~~ SOLN
6.0000 [IU] | Freq: Every day | SUBCUTANEOUS | Status: DC
Start: 2023-09-11 — End: 2023-09-18
  Administered 2023-09-11 – 2023-09-17 (×6): 6 [IU] via SUBCUTANEOUS
  Filled 2023-09-10 (×8): qty 0.06

## 2023-09-10 NOTE — Progress Notes (Signed)
Patient is concerned about his blood sugar. Patient states that his blood sugar has been in the 400's and 500's when he wakes up in the morning. Explained to patient that we will monitor his blood sugar and that if the doctor deems that it is necessary, a long acting insulin may be ordered to try to prevent the high blood sugars in the morning. Also educated patient on his diet and the reasons for his possible high blood sugars. Patient and patient mom Labrandon Dillingham were both understanding and had no further questions regarding the insulin.

## 2023-09-10 NOTE — Progress Notes (Signed)
  Inpatient Rehab Admissions Coordinator :  Per therapy recommendations patient was screened for CIR candidacy by Ottie Glazier RN MSN. Noted initial admit 07/09/23 and went to [facility] 11/29 for surgery. Now readmitted on 12/12. Prior to transfer to [facility], SNF was recommended and was being pursued.  Patient is not at a level to tolerate the intensity required to pursue a CIR admit. Recommend SNF at this time for prolonged rehab recovery that CIR can not offer. We will not place rehab consult at this time. Recommend other venues to be pursued. Please contact me with any questions.  Ottie Glazier RN MSN Admissions Coordinator [phone]

## 2023-09-10 NOTE — Progress Notes (Signed)
Requested Zyvox and Seroquel from the pharmacy twice but not received. Unable to give scheduled medication in a timely manner.

## 2023-09-10 NOTE — Progress Notes (Signed)
Received patient in bed.Awake,alert and oriented x 4He had signed his treatment consent.  Access used: Right femoral thigh catheter that worked well. Dresing change done today.  Duration of treatment: 3.5 HOURS.  Uf goal;Achieved 3.5 Liters.  Tolerated treatment : Yes.  Hand off to the patient's nurse.Back into his room with stable medical condition via transporter.

## 2023-09-10 NOTE — Evaluation (Addendum)
Physical Therapy Evaluation Patient Details Name: [name] MRN: [id number] DOB: [birth year] Today's Date: 09/10/2023  History of Present Illness  42 y.o. male presents to [facility] 09/09/23 as a transfer from [facility] for further management of endocarditis. Pt originally was admitted to [facility] 07/09/23 for DKA and sepsis after being found on the ground. 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 10/17 pt had a R MCA, L occipital, and B cerebellum CVA w/ septic emboli. ICU admit 10/18 w/ intubation 10/18-10/24. Pt was transferred to [facility] for MV repair on 11/29. At [facility], pt had cardiac cath w/ non obstructive disease, SAH, a craniotomy w/ abscess drainage on 12/4, and R third toe distal necrosis. 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 Pt has had hospital stays at [facility] and [facility] since 07/09/23 and has had decreased mobility since. Prior to 10/11, pt was independent w/ a RW for transfers. Since hospital admission, pt was had limited mobility and reports working with therapy on standing. In today's session, pt needed multiple verbal and tactile cues to adhere to task and redirect. Pt was able to stand  twice w/ MaxAx2 while holding onto the back of the recliner w/ RUE. Pt presents to therapy session with decreased strength, ROM, balance, and mobility. Pt reports he has a L LE prosthetic and will have family bring it to the hospital. Pt would benefit from acute skilled PT to address functional impairments. Recommending post-acute rehab >3 hrs to help pt regain independence. Pt has strong family support and high levels of motivation to work with therapy. Acute PT to follow.          If plan is discharge home, recommend the following: Two people to  help with walking and/or transfers;Assist for transportation;Supervision due to cognitive status;Help with stairs or ramp for entrance   Can travel by private vehicle   No    Equipment Recommendations  (TBD)  Recommendations for Other Services  Rehab consult    Functional Status Assessment Patient has had a recent decline in their functional status and demonstrates the ability to make significant improvements in function in a reasonable and predictable amount of time.     Precautions / Restrictions Precautions Precautions: Fall Precaution Comments: LAKA, L hemiparesis Restrictions Weight Bearing Restrictions Per Provider Order: No      Mobility  Bed Mobility Overal bed mobility: Needs Assistance Bed Mobility: Sit to Supine, Rolling, Sidelying to Sit Rolling: Mod assist, +2 for physical assistance Sidelying to sit: Mod assist, +2 for physical assistance, Used rails   Sit to supine: HOB elevated, Mod assist   General bed mobility comments: able to bring R LE towards EOB. Cues to bring L UE across chest prior to rolling. Cues to roll and use rail w/ R UE. Pt needed assist to bring L LE off EOB and for trunk elevation. Cues to lean onto elbow to scoot opposite hip forwards. ModA w/ bed pad to scoot towards EOB. ModA for return to supine for LE management    Transfers Overall transfer level: Needs assistance Equipment used:  (back of recliner) Transfers: Sit to/from Stand Sit to Stand: Max assist, +2 physical assistance, From elevated surface     General transfer comment: x1 partial stand, x1 full stand w/ use of  bed pad and MaxAx2. Pt able to hold onto top of recliner with R UE    Ambulation/Gait    General Gait Details: unable      Balance Overall balance assessment: Needs assistance Sitting-balance support: Single extremity supported, Feet supported Sitting balance-[name]: Fair Sitting balance - Comments: ModA for balance initially, progressed to CGA Postural  control: Posterior lean Standing balance support: Single extremity supported, During functional activity, Reliant on assistive device for balance Standing balance-[name]: Poor Standing balance comment: MaxAx2 to maintain standing balance       Pertinent Vitals/Pain Pain Assessment Pain Assessment: No/denies pain    Home Living Family/patient expects to be discharged to:: Private residence Living Arrangements: Parent (mother) Available Help at Discharge: Family;Available PRN/intermittently (mother is working on taking FMLA) Type of Home: House Home Access: Ramped entrance       Home Layout: One level Home Equipment: Wheelchair - Nurse, learning disability (2 wheels)      Prior Function Prior Level of Function : Needs assist;History of Falls (last six months)      Physical Assist : Mobility (physical);ADLs (physical) Mobility (physical): Bed mobility;Transfers;Gait ADLs (physical): Grooming;Bathing;Dressing;Toileting;IADLs Mobility Comments: prior to initial admit in 10/24, pt transerred with RW. Since hospital admissions, pt reports limited therapy with primarily staying in the bed ADLs Comments: independent prior to 10/24 admit, since admissions has required assist for bathing, dressing, iADLs     Extremity/Trunk Assessment   Upper Extremity Assessment Upper Extremity Assessment: Defer to OT evaluation    Lower Extremity Assessment Lower Extremity Assessment: LLE deficits/detail;RLE deficits/detail;Generalized weakness RLE Sensation: decreased light touch (decreased light touch L4, L5, S1) LLE Deficits / Details: AKA, required AAROM to move in the bed    Cervical / Trunk Assessment Cervical / Trunk Assessment: Normal  Communication   Communication Communication: No apparent difficulties Cueing Techniques: Verbal cues;Tactile cues  Cognition Arousal: Alert Behavior During Therapy: WFL for tasks assessed/performed Overall Cognitive Status:  Impaired/Different from baseline Area of Impairment: Attention, Memory, Following commands, Safety/judgement, Awareness, Problem solving    Memory: Decreased short-term memory Following Commands: Follows one step commands inconsistently Safety/Judgement: Decreased awareness of safety, Decreased awareness of deficits   Problem Solving: Slow processing, Decreased initiation, Difficulty sequencing, Requires verbal cues, Requires tactile cues General Comments: pt easily distractible requiring frequent verbal/tactile cues to adhere to task. Pt follows single step command inconsistently        General Comments General comments (skin integrity, edema, etc.): VSS on RA     PT Assessment Patient needs continued PT services  PT Problem List Decreased strength;Decreased activity tolerance;Decreased balance;Decreased mobility;Decreased coordination;Decreased cognition;Impaired sensation       PT Treatment Interventions DME instruction;Functional mobility training;Balance training;Patient/family education;Therapeutic activities;Neuromuscular re-education;Therapeutic exercise;Cognitive remediation;Wheelchair mobility training    PT Goals (Current goals can be found in the Care Plan section)  Acute Rehab PT Goals Patient Stated Goal: to get more therapy and gain independence PT Goal Formulation: With patient Time For Goal Achievement: 09/24/23 Potential to Achieve Goals: Fair    Frequency Min 1X/week     Co-evaluation   Reason for Co-Treatment: Complexity of the patient's impairments (multi-system involvement);For patient/therapist safety;To address functional/ADL transfers PT goals addressed during session: Mobility/safety with mobility;Balance         AM-PAC PT "6 Clicks" Mobility  Outcome Measure Help needed turning from your back to your side while in a flat bed without using bedrails?: A Lot Help needed moving from lying on your back to sitting on the side of a flat  bed without  using bedrails?: Total Help needed moving to and from a bed to a chair (including a wheelchair)?: Total Help needed standing up from a chair using your arms (e.g., wheelchair or bedside chair)?: Total Help needed to walk in hospital room?: Total Help needed climbing 3-5 steps with a railing? : Total 6 Click Score: 7    End of Session Equipment Utilized During Treatment: Gait belt Activity Tolerance: Patient tolerated treatment well Patient left: in bed;with call bell/phone within reach;with bed alarm set Nurse Communication: Mobility status PT Visit Diagnosis: Other abnormalities of gait and mobility (R26.89);Other symptoms and signs involving the nervous system (R29.898);Hemiplegia and hemiparesis Hemiplegia - Right/Left: Left Hemiplegia - dominant/non-dominant: Non-dominant Hemiplegia - caused by: Cerebral infarction    Time: 3151-7616 PT Time Calculation (min) (ACUTE ONLY): 33 min   Charges:   PT Evaluation $PT Eval Moderate Complexity: 1 Mod   PT General Charges $$ ACUTE PT VISIT: 1 Visit         Hilton Cork, PT, DPT Secure Chat Preferred  Rehab Office [phone]   Arturo Morton Brion Aliment 09/10/2023, 12:13 PM

## 2023-09-10 NOTE — Consult Note (Addendum)
Renal Service Consult Note [facility]  [name] 09/10/2023 Shane Krabbe, MD Requesting Physician: Dr. Blake Divine  Reason for Consult: ESRD pt w/ brain abscess sent to [facility] for I&D which was done 12/04. Pt sent back to [facility] since no longer needs tertiary care.  HPI: The patient is a 42 y.o. year-old w/ PMH as below who was here for prolonged admission w/ MRSA bacteremia/ MV/ TV endocarditis, bilat CVA's, septic emboli and brain abscess. TDC was removed and replaced while here. Transferred to [facility] for MV repair on 11/29. While there they heart cath which was unremarkable, they did craniotomy w/ abscess drainage on 12/4, had some SAH while on IV heparin, R third toe necrosis. Family wanted conservative Rx at that point so he was sent back to [facility]. Pt admitted. We are asked to see for dialysis.   Pt seen in room. He is much more alert and responsive and oriented than when I last saw him 3 weeks ago. No /co's today.   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 Filetted toe flap metatarsal resection. Debridement Plantar Foot wound;  Surgeon: Park Liter, DPM;  Location: [facility] OR;  Service: Podiatry;  Laterality: Right;   AMPUTATION Left 08/20/2018   Procedure: FIFTH METATARSAL BONE BIOPSY;  Surgeon:  Park Liter, DPM;  Location: [facility] OR;  Service: Podiatry;  Laterality: Left;   AMPUTATION Left 10/28/2018   Procedure: LEFT GREAT TOE AMPUTATION;  Surgeon: Park Liter, DPM;  Location: [facility] OR;  Service: Podiatry;  Laterality: Left;   AMPUTATION Left 09/16/2020   Procedure: AMPUTATION BELOW KNEE;  Surgeon: Larina Earthly, MD;  Location: [facility] OR;  Service: Vascular;  Laterality: Left;   AMPUTATION Left 10/15/2020   Procedure: AMPUTATION ABOVE KNEE;  Surgeon: Sherren Kerns, MD;  Location: [facility] OR;  Service: Vascular;  Laterality: Left;   APPLICATION OF WOUND VAC  02/02/2018   Procedure: APPLICATION OF WOUND VAC  Right Foot;  Surgeon: Park Liter, DPM;  Location: [facility] OR;  Service: Podiatry;;   APPLICATION OF WOUND VAC Left 10/28/2018   Procedure: APPLICATION OF WOUND VAC LEFT TOE;  Surgeon: Park Liter, DPM;  Location: [facility] OR;  Service: Podiatry;  Laterality: Left;   APPLICATION OF WOUND VAC Left 11/01/2018   Procedure: APPLICATION OF WOUND VAC;  Surgeon: Park Liter, DPM;  Location: [facility] OR;  Service: Podiatry;  Laterality: Left;   AV FISTULA PLACEMENT     left arm.   AV FISTULA PLACEMENT Right 12/22/2016   Procedure: INSERTION OF ARTERIOVENOUS (AV) GORE-TEX GRAFT ARM;  Surgeon: Sherren Kerns, MD;  Location: [facility] OR;  Service: Vascular;  Laterality: Right;   AV FISTULA PLACEMENT Left 05/26/2018   Procedure: INSERTION OF  ARTERIOVENOUS (AV) GORE-TEX GRAFT LEFT ARM;  Surgeon: Nada Libman, MD;  Location: [facility] OR;  Service: Vascular;  Laterality: Left;   EYE SURGERY     I & D EXTREMITY Right 10/31/2017   Procedure: IRRIGATION AND DEBRIDEMENT RIGHT FOOT;  Surgeon: Park Liter, DPM;  Location: [facility] OR;  Service: Podiatry;  Laterality: Right;   I & D EXTREMITY Left 08/20/2018   Procedure: IRRIGATION AND DEBRIDEMENT EXTREMITY WITH SECONDARY WOUND CLOSUREAND APPLICATION OF WOUND VAC LEFT FOOT;  Surgeon: Park Liter, DPM;  Location: [facility] OR;  Service: Podiatry;  Laterality: Left;   I & D  EXTREMITY Left 10/20/2018   Procedure: IRRIGATION AND DEBRIDEMENT LEFT FOOT  DEBRIDEMENT LATERAL FOOT WOUND;  Surgeon: Park Liter, DPM;  Location: [facility] OR;  Service: Podiatry;  Laterality: Left;   I & D EXTREMITY Left 10/28/2018   Procedure: IRRIGATION AND DEBRIDEMENT LEFT TOE;  Surgeon: Park Liter, DPM;  Location: [facility] OR;  Service: Podiatry;  Laterality: Left;   I & D EXTREMITY Left 09/14/2020   Procedure: IRRIGATION AND DEBRIDEMENT WRIST;  Surgeon: Betha Loa, MD;  Location: [facility] OR;  Service: Orthopedics;  Laterality: Left;   INSERTION OF DIALYSIS CATHETER     Right subclavian   IR AV DIALY SHUNT INTRO NEEDLE/INTRACATH INITIAL W/PTA/IMG RIGHT Right 02/05/2018   IR FLUORO GUIDE CV LINE LEFT  04/14/2023   IR FLUORO GUIDE CV LINE LEFT  07/16/2023   IR FLUORO GUIDE CV LINE RIGHT  01/31/2020   IR FLUORO GUIDE CV LINE RIGHT  01/30/2022   IR PTA VENOUS EXCEPT DIALYSIS CIRCUIT  01/30/2022   IR REMOVAL TUN CV CATH W/O FL  04/12/2023   IR THROMBECTOMY AV FISTULA W/THROMBOLYSIS/PTA INC/SHUNT/IMG LEFT Left 08/24/2018   IR THROMBECTOMY AV FISTULA W/THROMBOLYSIS/PTA INC/SHUNT/IMG LEFT Left 01/06/2019   IR US GUIDE VASC ACCESS LEFT  08/24/2018   IR US GUIDE VASC ACCESS LEFT  04/14/2023   IR US GUIDE VASC ACCESS LEFT  07/16/2023   IR US GUIDE VASC ACCESS LEFT  07/16/2023   IR US GUIDE VASC ACCESS RIGHT  02/05/2018   IR US GUIDE VASC ACCESS RIGHT  01/31/2020   IR US GUIDE VASC ACCESS RIGHT  07/16/2023   IR VENO/JUGULAR LEFT  07/16/2023   IR VENO/JUGULAR RIGHT  07/16/2023   IR VENOCAVAGRAM SVC  01/30/2022   IRRIGATION AND DEBRIDEMENT FOOT Right 10/23/2018   Procedure: Irrigation and Debridement to tendon, Left Foot;  Surgeon: Park Liter, DPM;  Location: [facility] OR;  Service: Podiatry;  Laterality: Right;   IRRIGATION AND DEBRIDEMENT FOOT Left 11/01/2018   Procedure: IRRIGATION AND DEBRIDEMENT PARTIAL WOUND CLOSURE LOCAL TISSUE TRANSFER AND FLAP ROTATION, LEFT FOOT;  Surgeon: Park Liter, DPM;  Location: [facility]  OR;  Service: Podiatry;  Laterality: Left;   TRANSESOPHAGEAL ECHOCARDIOGRAM (CATH LAB) N/A 07/14/2023   Procedure: TRANSESOPHAGEAL ECHOCARDIOGRAM;  Surgeon: Thomasene Ripple, DO;  Location: [facility] INVASIVE CV LAB;  Service: Cardiovascular;  Laterality: N/A;   TRANSMETATARSAL AMPUTATION N/A 08/18/2018   Procedure: IRRIGATION AND DEBRIDEMENT OF LEFT 5TH TOE AND TRANSMETATARSAL, WITH PARTICAL LEFT 5TH TOE AND METATARSAL AMPUTATION, BONE BIOPSY, WOUND VAC APPLICATION.;  Surgeon: Park Liter, DPM;  Location: [facility] OR;  Service: Podiatry;  Laterality: N/A;   UPPER EXTREMITY VENOGRAPHY N/A 11/16/2016   Procedure: Upper Extremity Venography - Right Central;  Surgeon: Sherren Kerns, MD;  Location: [facility] INVASIVE CV LAB;  Service: Cardiovascular;  Laterality: N/A;   UPPER EXTREMITY VENOGRAPHY N/A 05/25/2018   Procedure: UPPER EXTREMITY VENOGRAPHY -  Bilateral;  Surgeon: Cephus Shelling, MD;  Location: [facility] INVASIVE CV LAB;  Service: Cardiovascular;  Laterality: N/A;   Family History  Family History  Problem Relation Age of Onset   Diabetes Mellitus II Other    Diabetes Father    Renal Disease Father        ESRD   Social History  reports that he has never smoked. He has never used smokeless tobacco. He reports that he does not drink alcohol and does not use drugs. Allergies No Known Allergies Home medications Prior to Admission medications   Medication Sig Start Date End Date Taking? Authorizing Provider  acetaminophen (TYLENOL) 325 MG tablet Take 2 tablets (650 mg total) by mouth every 6 (six) hours as needed for mild pain (pain score 1-3), fever, moderate pain (pain score 4-6) or headache. 08/27/23  Yes Dorcas Carrow, MD  apixaban (ELIQUIS) 5 MG TABS tablet Take 1 tablet (5 mg total) by mouth 2 (two) times daily. 08/27/23  Yes Dorcas Carrow, MD  calcitRIOL (ROCALTROL) 0.5 MCG capsule Take 3 capsules (1.5 mcg total) by mouth every Monday, Wednesday, and Friday with hemodialysis. 05/23/21  Yes Kathlen Mody, MD  clonazePAM (KLONOPIN) 0.5 MG tablet Take 0.25 mg by mouth 2 (two) times daily as needed for anxiety. 09/09/23 12/08/23 Yes [provider]  gabapentin (NEURONTIN) 100 MG capsule Take 100 mg by mouth 3 (three) times daily.   Yes [provider]  insulin glargine (LANTUS) 100 UNIT/ML injection Inject 4 Units into the skin in the morning. 09/10/23 12/09/23 Yes [provider]  insulin lispro (HUMALOG) 100 UNIT/ML injection Inject 0-4 Units into the skin 3 (three) times daily with meals. BG Less Than 70: Follow hypoglycemia instructions BG 70 - 179: No dose needed, BG 180 - 200: 0, BG 201 - 250: 1, BG 251 - 300: 2, BG 301 - 350: 3, BG 351 - 400: 4, BG Greater Than 400: Call Provider 09/09/23 12/09/23 Yes [provider]  metoprolol tartrate (LOPRESSOR) 25 MG tablet Take 25 mg by mouth 2 (two) times daily.   Yes [provider]  omeprazole (PRILOSEC) 20 MG capsule Take 20 mg by mouth daily. 09/10/23 12/09/23 Yes [provider]  ondansetron (ZOFRAN-ODT) 4 MG disintegrating tablet Take 4 mg by mouth every 6 (six) hours as needed for nausea or vomiting. 09/09/23 12/08/23 Yes [provider]  oxyCODONE (OXY IR/ROXICODONE) 5 MG immediate release tablet Take 5 mg by mouth every 4 (four) hours as needed for severe pain (pain score 7-10). 09/09/23 09/14/23 Yes [provider]  QUEtiapine (SEROQUEL) 25 MG tablet Take 12.5-25 mg by mouth 2 (two) times daily. Take 0.5 tablets (12.5 mg total) by mouth every morning AND 1 tablet (25 mg total) at bedtime. 09/09/23 12/08/23 Yes [provider]  sevelamer (RENAGEL) 800 MG tablet Take 800 mg by mouth 3 (three) times daily with meals. 09/09/23 12/08/23 Yes [provider]  vancomycin (VANCOREADY) 500 MG/100ML IVPB Inject 100 mLs (500 mg total) into the vein every Monday, Wednesday, and Friday with hemodialysis. 08/27/23  Yes Dorcas Carrow, MD  albuterol (PROVENTIL) (2.5 MG/3ML)  0.083% nebulizer solution Take 3 mLs (2.5 mg total) by nebulization every 6 (six) hours as needed for wheezing or shortness of breath. Patient not taking: Reported on 09/09/2023 08/27/23   Dorcas Carrow, MD     Vitals:   09/09/23 2002 09/09/23 2352 09/10/23 0558 09/10/23 0741  BP: (!) 165/75 (!) 152/88 (!) 159/87 (!) 191/111  Pulse: 97 98 91  91  Resp:  20 20 16   Temp: 98.8 F (37.1 C) 98.4 F (36.9 C) 97.6 F (36.4 C) 98.3 F (36.8 C)  TempSrc: Oral Oral Oral Oral  SpO2: 98% 100% 100% 99%   Exam Gen alert, no distress No rash, cyanosis or gangrene Sclera anicteric, throat clear  No jvd or bruits Chest clear bilat to bases, no rales/ wheezing RRR no RG Abd soft ntnd no mass or ascites +bs GU nl male MS no joint effusions or deformity LUE 2+ edema  Ext bilat AKA, 1+ L stump edema, none on R Neuro is alert , nf, interacting    L fem TDC in place, clean exit site   Renal-related home meds: - eliquis 5 bid - rocaltrol 1.5 mcg mwf - gabapentin 100 tid - renagel 1 ac tid - metoprolol 25 bid    OP HD: [facility] MWF  4h   400/1.5  77kg (from last admit, no new orders)  L fem TDC 2/3 bath  -  no heparin   Assessment/ Plan: 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 Not a candidate for surgery here. SP line holiday w/ new TDC placed 10/18. Continues on IV vanc . Adjusted plan per ID pharm is 750mg  MWF through 10/27/23.  SP I&D of brain abscess: done 12/04 at [facility] Acute bilat CVA: suspected consequence of septic emboli  ESRD: on HD MWF. Not sure last HD. Plan HD today/ tonight.  Volume: on RA, L arm 2+ edema, no other edema Anemia: Hb 9s stable. ESA dose increased to 100 mcg q. weekly. S/P 5 units PRBCs since admission.  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 Add on phos. Cont binders w/ meals.  Hypertension: BP's controlled, cont metoprolol 25 bid.  HD access:  L  femoral TDC placed on 10/18. Pt has occlusion of bilateral subclavian veins limiting his options for  dialysis access.      Vinson Moselle  MD [facility] 09/10/2023, 11:57 AM  Recent Labs  Lab 09/09/23 2033  HGB 9.0*  ALBUMIN 2.9*  CALCIUM 9.7  CREATININE 5.24*  K 4.3   Inpatient medications:  apixaban  5 mg Oral BID   calcitRIOL  1.5 mcg Oral Q M,W,F-HD   Chlorhexidine Gluconate Cloth  6 each Topical Daily   gabapentin  100 mg Oral TID   insulin aspart  0-9 Units Subcutaneous TID WC   insulin aspart  2 Units Subcutaneous TID WC   [START ON 09/11/2023] insulin glargine-yfgn  6 Units Subcutaneous Daily   metoprolol tartrate  25 mg Oral BID   pantoprazole  40 mg Oral Daily   QUEtiapine  12.5 mg Oral q morning   QUEtiapine  25 mg Oral QHS   sevelamer carbonate  800 mg Oral TID WC    vancomycin     acetaminophen, hydrALAZINE

## 2023-09-10 NOTE — Procedures (Signed)
I have reviewed the HD regimen and made appropriate changes.  Vinson Moselle MD  [facility] 09/10/2023, 4:43 PM

## 2023-09-10 NOTE — Evaluation (Addendum)
Occupational Therapy Evaluation Patient Details Name: [name] MRN: [id number] DOB: Jul 23, 1981 Today's Date: 09/10/2023   History of Present Illness 42 y.o. male presents to [facility] 09/09/23 as a transfer from [facility] for further management of endocarditis. Pt originally was admitted to [facility] 07/09/23 for DKA and sepsis after being found on the ground. 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 10/17 pt had a R MCA, L occipital, and B cerebellum CVA w/ septic emboli. ICU admit 10/18 w/ intubation 10/18-10/24. Pt was transferred to [facility] for MV repair on 11/29. At [facility], pt had cardiac cath w/ non obstructive disease, SAH, a craniotomy w/ abscess drainage on 12/4, and R third toe distal necrosis. 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 Pt currently with functional limitiations due to the deficits listed below (see OT problem list). Pt with extensive medical hx and prolonged bed level care since October. Pt prior to July admission with AMA discharge was mod I with all adls and driving. Pt at this time requires two person (A) for transfer or hoyer lift. Pt able to (A) with rolling using R UE only. Pt likely to need further rehab for extended period of time prior to home d/c request by the patient.  Pt will benefit from skilled OT to increase their independence and safety with adls and balance to allow discharge skilled inpatient follow up therapy, <3 hours/day. .  Call me [name]  Pt reports that mother will bring prosthetic LLE into hospital. Uncertain when pt don last time and if it is fit for L AKA or BKA.     If plan is discharge home, recommend the following: Two people to help with walking and/or transfers;A lot of help with  bathing/dressing/bathroom;Assistance with cooking/housework;Assistance with feeding;Help with stairs or ramp for entrance;Assist for transportation;Direct supervision/assist for financial management;Direct supervision/assist for medications management    Functional Status Assessment  Patient has had a recent decline in their functional status and demonstrates the ability to make significant improvements in function in a reasonable and predictable amount of time.  Equipment Recommendations  Other (comment);Hoyer lift (hospital bed, [facility])    Recommendations for Other Services Speech consult     Precautions / Restrictions Precautions Precautions: Fall Precaution Comments: LAKA, L hemiparesis Restrictions Weight Bearing Restrictions Per Provider Order: No LLE Weight Bearing Per Provider Order: Non weight bearing      Mobility Bed Mobility Overal bed mobility: Needs Assistance Bed Mobility: Sit to Supine, Rolling, Sidelying to Sit Rolling: Mod assist, +2 for physical assistance Sidelying to sit: Mod assist, +2 for physical assistance, Used rails Supine to sit: +2 for physical assistance, Total assist, HOB elevated Sit to supine: HOB elevated, Mod assist   General bed mobility comments: able to bring R LE towards EOB. Cues to bring L UE across chest prior to rolling. Cues to roll and use rail w/ R UE. Pt needed assist to bring L LE off EOB and for trunk elevation. Cues to lean onto elbow to scoot opposite hip forwards. ModA w/ bed pad to scoot towards EOB. ModA for return to supine for LE management    Transfers Overall transfer level: Needs assistance Equipment used:  (back of recliner) Transfers: Sit to/from Stand Sit to Stand: Max assist, +2 physical assistance, From elevated  surface           General transfer comment: x1 partial stand, x1 full stand w/ use of bed pad and MaxAx2. Pt able to hold onto top of recliner with R UE Transfer via Lift Equipment: Maximove    Balance  Overall balance assessment: Needs assistance Sitting-balance support: Single extremity supported, Feet supported Sitting balance-[name]: Fair Sitting balance - Comments: ModA for balance initially, progressed to CGA Postural control: Posterior lean Standing balance support: Single extremity supported, During functional activity, Reliant on assistive device for balance Standing balance-[name]: Poor Standing balance comment: MaxAx2 to maintain standing balance                           ADL either performed or assessed with clinical judgement   ADL Overall ADL's : Needs assistance/impaired Eating/Feeding: Set up;Bed level       Upper Body Bathing: Maximal assistance   Lower Body Bathing: Maximal assistance;Bed level Lower Body Bathing Details (indicate cue type and reason): don sock Upper Body Dressing : Maximal assistance;Bed level   Lower Body Dressing: Total assistance Lower Body Dressing Details (indicate cue type and reason): to don R sock                     Vision Baseline Vision/History: 0 No visual deficits Ability to See in Adequate Light: 0 Adequate Patient Visual Report: No change from baseline Vision Assessment?: Vision impaired- to be further tested in functional context Additional Comments: pt able to functionally use vision during session but needs to be further formally tested     Perception         Praxis         Pertinent Vitals/Pain Pain Assessment Pain Assessment: No/denies pain Faces Pain Scale: No hurt Breathing: normal Negative Vocalization: none Facial Expression: smiling or inexpressive Body Language: relaxed Consolability: no need to console PAINAD Score: 0     Extremity/Trunk Assessment Upper Extremity Assessment Upper Extremity Assessment: LUE deficits/detail;Right hand dominant LUE Deficits / Details: No active movement noted, no activatino of scapula. no subluxation noted. EDEMA very present. pt with  decreased hand extension of digits. likely will need restin g hand splint. LUE Sensation: decreased light touch;decreased proprioception LUE Coordination: decreased fine motor;decreased gross motor   Lower Extremity Assessment Lower Extremity Assessment: Defer to PT evaluation   Cervical / Trunk Assessment Cervical / Trunk Assessment: Normal   Communication Communication Communication: No apparent difficulties   Cognition Arousal: Alert Behavior During Therapy: WFL for tasks assessed/performed Overall Cognitive Status: Impaired/Different from baseline Area of Impairment: Attention, Memory, Following commands, Safety/judgement, Awareness, Problem solving                 Orientation Level: Disoriented to, Time, Situation Current Attention Level: Focused Memory: Decreased short-term memory Following Commands: Follows one step commands inconsistently Safety/Judgement: Decreased awareness of safety, Decreased awareness of deficits Awareness: Intellectual Problem Solving: Slow processing, Decreased initiation, Difficulty sequencing, Requires verbal cues, Requires tactile cues General Comments: pt needs redirection throughout session to session and answering questions directly. pt verbalized that the plan is to d/c home with mother by his birthday. pt reports as well that his mother wants him to have maximized therapy. OT / PT educated on various venues for therapy upon d/c and pt reports "home".pt demonstrate difficulty with two step commands during session     General Comments  RA VSS checked BP    Exercises Exercises: Other exercises  Other Exercises Other Exercises: PROM LUE shoulder/scapula/elbow /wrist hand 15x ea joint/movement. pt with limited awareness OT ranging hand/arm despite cues to attend   Shoulder Instructions      Home Living Family/patient expects to be discharged to:: Private residence Living Arrangements: Parent (mother) Available Help at Discharge:  Family;Available PRN/intermittently (mother is working on taking FMLA) Type of Home: House Home Access: Ramped entrance     Home Layout: One level     Bathroom Shower/Tub: Chief Strategy Officer: Standard Bathroom Accessibility: Yes How Accessible: Accessible via wheelchair Home Equipment: Wheelchair - Nurse, learning disability (2 wheels)      Lives With: Family (mother)    Prior Functioning/Environment Prior Level of Function : Needs assist;History of Falls (last six months)       Physical Assist : Mobility (physical);ADLs (physical) Mobility (physical): Bed mobility;Transfers;Gait ADLs (physical): Grooming;Bathing;Dressing;Toileting;IADLs Mobility Comments: prior to initial admit in 10/24, pt transerred with RW. Since hospital admissions, pt reports limited therapy with primarily staying in the bed ADLs Comments: independent prior to 10/24 admit, since admissions has required assist for bathing, dressing, iADLs        OT Problem List: Decreased strength;Decreased range of motion;Decreased activity tolerance;Impaired balance (sitting and/or standing);Decreased coordination;Decreased cognition;Decreased safety awareness;Decreased knowledge of use of DME or AE;Impaired sensation;Impaired UE functional use;Pain      OT Treatment/Interventions: Self-care/ADL training;Therapeutic exercise;Energy conservation;DME and/or AE instruction;Neuromuscular education;Therapeutic activities;Patient/family education;Balance training;Modalities;Splinting;Cognitive remediation/compensation;Visual/perceptual remediation/compensation    OT Goals(Current goals can be found in the care plan section) Acute Rehab OT Goals Patient Stated Goal: to get home before birthday OT Goal Formulation: With patient Time For Goal Achievement: 09/24/23 Potential to Achieve Goals: Good  OT Frequency: Min 1X/week    Co-evaluation PT/OT/SLP Co-Evaluation/Treatment: Yes Reason for Co-Treatment:  Complexity of the patient's impairments (multi-system involvement);For patient/therapist safety;To address functional/ADL transfers   OT goals addressed during session: ADL's and self-care;Strengthening/ROM      AM-PAC OT "6 Clicks" Daily Activity     Outcome Measure Help from another person eating meals?: A Little (setup) Help from another person taking care of personal grooming?: A Lot Help from another person toileting, which includes using toliet, bedpan, or urinal?: Total Help from another person bathing (including washing, rinsing, drying)?: Total Help from another person to put on and taking off regular upper body clothing?: Total Help from another person to put on and taking off regular lower body clothing?: Total 6 Click Score: 9   End of Session Equipment Utilized During Treatment: Gait belt Nurse Communication: Mobility status;Precautions;Weight bearing status (check IV site)  Activity Tolerance: Patient tolerated treatment well Patient left: in bed;with call bell/phone within reach;with bed alarm set  OT Visit Diagnosis: Other abnormalities of gait and mobility (R26.89);Other symptoms and signs involving the nervous system (R29.898);Hemiplegia and hemiparesis;Muscle weakness (generalized) (M62.81);Other symptoms and signs involving cognitive function Hemiplegia - Right/Left: Left Hemiplegia - dominant/non-dominant: Non-Dominant Hemiplegia - caused by: Cerebral infarction                Time: 0820-0852 OT Time Calculation (min): 32 min Charges:  OT General Charges $OT Visit: 1 Visit OT Evaluation $OT Eval Moderate Complexity: 1 Mod   Brynn, OTR/L  Acute Rehabilitation Services Office: [phone] .   Mateo Flow 09/10/2023, 4:55 PM

## 2023-09-10 NOTE — Plan of Care (Signed)
  Problem: Activity: Goal: Risk for activity intolerance will decrease Outcome: Progressing   Problem: Nutrition: Goal: Adequate nutrition will be maintained Outcome: Progressing   Problem: Coping: Goal: Level of anxiety will decrease Outcome: Progressing   

## 2023-09-10 NOTE — Progress Notes (Signed)
Notified provider of itching, order received

## 2023-09-10 NOTE — Consult Note (Signed)
[facility]       Reason for Consult:  Disseminated  MRSA infection  Referring Physician: Dr. Blake Divine  Principal Problem:   Endocarditis Active Problems:   Insulin dependent type 1 diabetes mellitus ([name])   Diabetic gastroparesis ([name])   ESRD on dialysis [facility])   Diabetic foot infection ([name])   MRSA bacteremia   Endocarditis of mitral valve   Endocarditis of tricuspid valve   Cerebrovascular accident (CVA) due to embolism of cerebral artery ([name])    apixaban  5 mg Oral BID   calcitRIOL  1.5 mcg Oral Q M,W,F-HD   Chlorhexidine Gluconate Cloth  6 each Topical Daily   [START ON 09/11/2023] Chlorhexidine Gluconate Cloth  6 each Topical Q0600   gabapentin  100 mg Oral TID   insulin aspart  0-9 Units Subcutaneous TID WC   insulin aspart  2 Units Subcutaneous TID WC   [START ON 09/11/2023] insulin glargine-yfgn  6 Units Subcutaneous Daily   metoprolol tartrate  25 mg Oral BID   pantoprazole  40 mg Oral Daily   QUEtiapine  12.5 mg Oral q morning   QUEtiapine  25 mg Oral QHS   sevelamer carbonate  800 mg Oral TID WC    Recommendations:  Continue with vancomycin as outlined through 10/27/23 No indication for linezolid or Bactrim.   Assessment: MV and TV  endocarditis with CNS emboli, prostatic abscess and MRSA in previous blood cultures.  Will need to continue with a prolonged course as outlined.     Dr. Elinor Parkinson available over the weekend if needed, otherwise ID team will follow up next week with intermittent follow up  HPI: [name] is a 42 y.o. male with a history of ESRD on hemodialysis, type one diabetes, history of left AKA initially admitted here on 10/11 for DKA and found to have MV and TV endocarditis and MRSA bacteremia.  Positive blood cultures 07/10/23 and repeated blood cultures since that time negative.  He had an emboli stroke and an ICU stay and extubated but clinically worsened.  He was then transferred to [facility] and he underwent  craniotomy for abscess evacuation by neurosurgery and transferred back here.  EOT planned for 10/27/23 from time of surgery.    Review of Systems:  Constitutional: negative for fevers and chills All other systems reviewed and are negative    Past Medical History:  Diagnosis Date   Anemia    Cellulitis and abscess of toe of left foot    Diabetes mellitus without complication ([name])    Diabetic gastroparesis ([name])    Diabetic ulcer of left midfoot associated with diabetes mellitus due to underlying condition, with necrosis of bone ([name])    Dialysis patient ([name])    ESRD (end stage renal disease) ([name])    Hypertension    Sepsis ([name])     Social History   Tobacco Use   Smoking status: Never   Smokeless tobacco: Never  Vaping Use   Vaping status: Never Used  Substance Use Topics   Alcohol use: No   Drug use: No    Family History  Problem Relation Age of Onset   Diabetes Mellitus II Other    Diabetes Father    Renal Disease Father        ESRD    No Known Allergies  Physical Exam: Constitutional: in no apparent distress  Vitals:   09/10/23 1408 09/10/23 1445  BP: (!) 160/92 (!) (P) 135/95  Pulse: 90 (P) 97  Resp: 20 (P) 20  Temp: 98.1 F (36.7 C)   SpO2: 100% (P) 100%   EYES: anicteric Respiratory: normal respiratory effort Musculoskeletal: peripheral pulses normal, no pedal edema, no clubbing or cyanosis  Lab Results  Component Value Date   WBC 5.2 09/09/2023   HGB 9.0 (L) 09/09/2023   HCT 28.8 (L) 09/09/2023   MCV 93.5 09/09/2023   PLT 175 09/09/2023    Lab Results  Component Value Date   CREATININE 5.24 (H) 09/09/2023   BUN 27 (H) 09/09/2023   NA 135 09/09/2023   K 4.3 09/09/2023   CL 101 09/09/2023   CO2 21 (L) 09/09/2023    Lab Results  Component Value Date   ALT 12 09/09/2023   AST 15 09/09/2023   ALKPHOS 116 09/09/2023     Microbiology: No results found for this or any previous visit (from the past 240 hours).  Gardiner Barefoot,  MD [facility] [email or web address] 09/10/2023, 3:17 PM

## 2023-09-10 NOTE — Progress Notes (Signed)
[facility]                                                                               [name], is a 42 y.o. male, DOB - 07-27-1981, ZOX:[id number] Admit date - 09/09/2023    Outpatient Primary MD for the patient is [name], NP  LOS - 1  days    Brief summary   [name] is a 42 y.o. 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  While at [facility] he underwent cardiac cath showing non obstructive disease, craniotomy with abscess drainage on 09/01/23,. He had subarachnoid hemorrhage while on IV heparin, right third toe distal necrosis,was seen by podiatry, and family wanted conservative management.  Since his tertiary care needs have been completed, he was transferred back to [facility] for further management.  Therapy eval recommending SNF/ CIR.  But son is adamant about going home.   Assessment & Plan    Assessment and Plan:   MRSA MV /TV endocarditis Continue with vancomycin as per pharmacy. .  ID consulted.  EOT is 1/29       Brain abscess s/p craniotomy and abscess drainage on 12/4 From MRSA. On IV vancomycin.    ESRD on HD MWF Nephrology will be consulted.      Type 1 DM insulin dependent.  CBG (last 3)  Recent Labs    09/09/23 2227 09/10/23 0806 09/10/23 1159  GLUCAP 358* 204* 342*    Increase semglee to 6 units , add novolog 2 units TIDAC and SSI.       Gastroparesis  Resume PPI.    Left  internal jugular DVT Resume eliquis.      Subarachnoid hemorrhage while on Heparin.  Monitor.      Right third toe necrosis Was seen by podiatry at  [facility] recommended surgical intervention, but family opted for conservative management with IV antibiotics     Prostate fluid collection.  Dilated Seminal vesicle and no surgical intervention needed.      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  Monitor.      RN Pressure Injury Documentation: Pressure Injury 08/03/23 Buttocks Left Stage 2 -  Partial thickness loss of dermis presenting as a shallow open injury with a red, pink wound bed without slough. Several small open areas, right buttocks area (Active)  08/03/23 1945  Location: Buttocks  Location Orientation: Left  Staging: Stage 2 -  Partial thickness loss of dermis presenting as a shallow open injury with a red, pink wound bed without slough.  Wound Description (Comments): Several small open areas, right buttocks area  Present on Admission: No  Dressing Type Foam - Lift dressing to assess site every shift 08/24/23 2000     Estimated body mass index is 26.94 kg/m as calculated from the following:  Height as of 07/19/23: 5\' 7"  (1.702 m).   Weight as of 08/24/23: 78 kg.  Code Status: full code.  DVT Prophylaxis:   apixaban (ELIQUIS) tablet 5 mg   Level of Care: Level of care: Med-Surg Family Communication: FAMILY AT BEDSIDE.   Disposition Plan:     Remains inpatient appropriate:  pending SNF.   Procedures:  None.   Consultants:   Nephrology ID.   Antimicrobials:   Anti-infectives (From admission, onward)    Start     Dose/Rate Route Frequency Ordered Stop   09/10/23 1200  vancomycin (VANCOREADY) IVPB 750 mg/150 mL        750 mg 150 mL/hr over 60 Minutes Intravenous Every M-W-F (Hemodialysis) 09/09/23 2030     09/09/23 2200  linezolid (ZYVOX) tablet 600 mg  Status:  Discontinued        600 mg Oral Every 12 hours 09/09/23 2035 09/10/23 0946        Medications  Scheduled Meds:  apixaban  5 mg Oral BID   calcitRIOL  1.5 mcg Oral Q M,W,F-HD   Chlorhexidine Gluconate Cloth  6  each Topical Daily   [START ON 09/11/2023] Chlorhexidine Gluconate Cloth  6 each Topical Q0600   gabapentin  100 mg Oral TID   insulin aspart  0-9 Units Subcutaneous TID WC   insulin aspart  2 Units Subcutaneous TID WC   [START ON 09/11/2023] insulin glargine-yfgn  6 Units Subcutaneous Daily   metoprolol tartrate  25 mg Oral BID   pantoprazole  40 mg Oral Daily   QUEtiapine  12.5 mg Oral q morning   QUEtiapine  25 mg Oral QHS   sevelamer carbonate  800 mg Oral TID WC   Continuous Infusions:  anticoagulant sodium citrate     vancomycin     PRN Meds:.acetaminophen, alteplase, anticoagulant sodium citrate, feeding supplement (NEPRO CARB STEADY), heparin, hydrALAZINE, lidocaine (PF), lidocaine-prilocaine, oxyCODONE, pentafluoroprop-tetrafluoroeth    Subjective:   [name] was seen and examined today.  H was adamant about going home by Christmas.  Objective:   Vitals:   09/09/23 2002 09/09/23 2352 09/10/23 0558 09/10/23 0741  BP: (!) 165/75 (!) 152/88 (!) 159/87 (!) 191/111  Pulse: 97 98 91 91  Resp:  20 20 16   Temp: 98.8 F (37.1 C) 98.4 F (36.9 C) 97.6 F (36.4 C) 98.3 F (36.8 C)  TempSrc: Oral Oral Oral Oral  SpO2: 98% 100% 100% 99%    Intake/Output Summary (Last 24 hours) at 09/10/2023 1351 Last data filed at 09/09/2023 2300 Gross per 24 hour  Intake 240 ml  Output --  Net 240 ml   There were no vitals filed for this visit.   Exam General exam: Appears calm and comfortable  Respiratory system: Clear to auscultation. Respiratory effort normal. Cardiovascular system: S1 & S2 heard, RRR.  Gastrointestinal system: Abdomen is nondistended, soft and nontender. Central nervous system: Alert and oriented to person and place.  Extremities: left AKA.  Skin: No rashes,  Psychiatry: agitated.     Data Reviewed:  I have personally reviewed following labs and imaging studies   CBC Lab Results  Component Value Date   WBC 5.2 09/09/2023   RBC 3.08 (L)  09/09/2023   HGB 9.0 (L) 09/09/2023   HCT 28.8 (L) 09/09/2023   MCV 93.5 09/09/2023   MCH 29.2 09/09/2023   PLT 175 09/09/2023   MCHC 31.3 09/09/2023   RDW 15.5 09/09/2023   LYMPHSABS 1.0 09/09/2023   MONOABS 0.4 09/09/2023   EOSABS  0.3 09/09/2023   BASOSABS 0.0 09/09/2023     Last metabolic panel Lab Results  Component Value Date   NA 135 09/09/2023   K 4.3 09/09/2023   CL 101 09/09/2023   CO2 21 (L) 09/09/2023   BUN 27 (H) 09/09/2023   CREATININE 5.24 (H) 09/09/2023   GLUCOSE 189 (H) 09/09/2023   GFRNONAA 13 (L) 09/09/2023   GFRAA 7 (L) 05/30/2020   CALCIUM 9.7 09/09/2023   PHOS 7.7 (H) 08/27/2023   PROT 7.6 09/09/2023   ALBUMIN 2.9 (L) 09/09/2023   BILITOT 0.8 09/09/2023   ALKPHOS 116 09/09/2023   AST 15 09/09/2023   ALT 12 09/09/2023   ANIONGAP 13 09/09/2023    CBG (last 3)  Recent Labs    09/09/23 2227 09/10/23 0806 09/10/23 1159  GLUCAP 358* 204* 342*      Coagulation Profile: No results for input(s): "INR", "PROTIME" in the last 168 hours.   Radiology Studies: No results found.     Kathlen Mody M.D. [facility] 09/10/2023, 1:51 PM  Available via Epic secure chat 7am-7pm After 7 pm, please refer to night coverage provider listed on amion.

## 2023-09-10 NOTE — Progress Notes (Signed)
Patients mother Kaylin Kurtis states that before patient became hospitalized in October he was driving himself to and from dialysis, completing ADLs himself and fully capable of taking care of himself on his own. [name] states that she works 8 hours a day and has not been approved for [employer] yet so is unable to stay home and help care for her son. [name] is requesting that the patient go to rehab so that he can work on being as independent as possible while she is away from the home working. Patient is also eager to work with physical therapy and occupational therapy and requested to have rigorous therapy for more than one hour a day. Explained to patient and patient mom [name] that physical therapy and occupational therapy will come to work with the patient but it will not be for multiple hours a day due to this being a med-surg unit and not inpatient rehab.   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 [name] states that if left a voicemail she will step away and give a call back when she has a chance. Anita's concern is having to work and leave her son at home alone for 8+ hours, she wants to be sure he would be able to safely care for himself if she does this.

## 2023-09-10 NOTE — Progress Notes (Signed)
PHARMACY CONSULT NOTE FOR:  OUTPATIENT  PARENTERAL ANTIBIOTIC THERAPY (OPAT)  Informational as the patient will receive antibiotics with hemodialysis outpatient. Plan discussed with the renal team.  Indication: Disseminated MRSA infection Regimen: Vancomycin 750 mg/HD-MWF End date: 10/27/23  IV antibiotic discharge orders are pended. To discharging provider:  please sign these orders via discharge navigator,  Select New Orders & click on the button choice - Manage This Unsigned Work.    Thank you for allowing pharmacy to be a part of this patient's care.  Georgina Pillion, PharmD, BCPS, BCIDP Infectious Diseases Clinical Pharmacist 09/10/2023 9:34 AM   **Pharmacist phone directory can now be found on [email or web address] ([email or web address]).  Listed under [facility].

## 2023-09-11 ENCOUNTER — Other Ambulatory Visit: Payer: Self-pay

## 2023-09-11 DIAGNOSIS — I120 Hypertensive chronic kidney disease with stage 5 chronic kidney disease or end stage renal disease: Secondary | ICD-10-CM | POA: Diagnosis not present

## 2023-09-11 DIAGNOSIS — D631 Anemia in chronic kidney disease: Secondary | ICD-10-CM | POA: Diagnosis not present

## 2023-09-11 DIAGNOSIS — N186 End stage renal disease: Secondary | ICD-10-CM | POA: Diagnosis not present

## 2023-09-11 DIAGNOSIS — K3184 Gastroparesis: Secondary | ICD-10-CM | POA: Diagnosis not present

## 2023-09-11 DIAGNOSIS — E1022 Type 1 diabetes mellitus with diabetic chronic kidney disease: Secondary | ICD-10-CM | POA: Diagnosis not present

## 2023-09-11 DIAGNOSIS — I330 Acute and subacute infective endocarditis: Secondary | ICD-10-CM | POA: Diagnosis not present

## 2023-09-11 DIAGNOSIS — Z992 Dependence on renal dialysis: Secondary | ICD-10-CM | POA: Diagnosis not present

## 2023-09-11 DIAGNOSIS — I6340 Cerebral infarction due to embolism of unspecified cerebral artery: Secondary | ICD-10-CM | POA: Diagnosis not present

## 2023-09-11 DIAGNOSIS — I38 Endocarditis, valve unspecified: Secondary | ICD-10-CM | POA: Diagnosis not present

## 2023-09-11 DIAGNOSIS — L089 Local infection of the skin and subcutaneous tissue, unspecified: Secondary | ICD-10-CM | POA: Diagnosis not present

## 2023-09-11 DIAGNOSIS — R7881 Bacteremia: Secondary | ICD-10-CM | POA: Diagnosis not present

## 2023-09-11 DIAGNOSIS — N250 Renal osteodystrophy: Secondary | ICD-10-CM | POA: Diagnosis not present

## 2023-09-11 DIAGNOSIS — B9562 Methicillin resistant Staphylococcus aureus infection as the cause of diseases classified elsewhere: Secondary | ICD-10-CM | POA: Diagnosis not present

## 2023-09-11 LAB — BASIC METABOLIC PANEL
Anion gap: 14 (ref 5–15)
BUN: 27 mg/dL — ABNORMAL HIGH (ref 6–20)
CO2: 23 mmol/L (ref 22–32)
Calcium: 9.5 mg/dL (ref 8.9–10.3)
Chloride: 96 mmol/L — ABNORMAL LOW (ref 98–111)
Creatinine, Ser: 4.75 mg/dL — ABNORMAL HIGH (ref 0.61–1.24)
GFR, Estimated: 15 mL/min — ABNORMAL LOW (ref >60)
Glucose, Bld: 462 mg/dL — ABNORMAL HIGH (ref 70–99)
Potassium: 5.1 mmol/L (ref 3.5–5.1)
Sodium: 133 mmol/L — ABNORMAL LOW (ref 135–145)

## 2023-09-11 LAB — HEPATITIS B SURFACE ANTIGEN: Hepatitis B Surface Ag: NONREACTIVE

## 2023-09-11 LAB — GLUCOSE, CAPILLARY
Glucose-Capillary: 445 mg/dL — ABNORMAL HIGH (ref 70–99)
Glucose-Capillary: 325 mg/dL — ABNORMAL HIGH (ref 70–99)
Glucose-Capillary: 84 mg/dL (ref 70–99)
Glucose-Capillary: 263 mg/dL — ABNORMAL HIGH (ref 70–99)

## 2023-09-11 MED ORDER — LORATADINE 10 MG PO TABS
10.0000 mg | ORAL_TABLET | Freq: Every day | ORAL | Status: DC
Start: 2023-09-11 — End: 2023-11-24
  Administered 2023-09-11 – 2023-11-24 (×68): 10 mg via ORAL
  Filled 2023-09-11 (×74): qty 1

## 2023-09-11 MED ORDER — DIPHENHYDRAMINE-ZINC ACETATE 2-0.1 % EX CREA: TOPICAL_CREAM | Freq: Two times a day (BID) | CUTANEOUS | Status: DC | PRN

## 2023-09-11 MED ORDER — CLONAZEPAM 0.125 MG PO TBDP
0.1250 mg | ORAL_TABLET | Freq: Once | ORAL | Status: AC
  Administered 2023-09-12: 0.125 mg via ORAL
  Filled 2023-09-11: qty 1

## 2023-09-11 MED ORDER — DIPHENHYDRAMINE HCL 12.5 MG/5ML PO ELIX
12.5000 mg | ORAL_SOLUTION | Freq: Three times a day (TID) | ORAL | Status: DC | PRN
  Administered 2023-09-12 – 2023-09-24 (×9): 12.5 mg via ORAL
  Filled 2023-09-11: qty 10
  Filled 2023-09-11: qty 5
  Filled 2023-09-11 (×8): qty 10

## 2023-09-11 NOTE — Progress Notes (Signed)
Notified provider of itching

## 2023-09-11 NOTE — Progress Notes (Signed)
[facility]                                                                               [name], is a 42 y.o. male, DOB - 07-11-1981, ZOX:[id number] Admit date - 09/09/2023    Outpatient Primary MD for the patient is Shane Sessions, NP  LOS - 2  days    Brief summary   [name] is a 42 y.o. 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  While at [facility] he underwent cardiac cath showing non obstructive disease, craniotomy with abscess drainage on 09/01/23,. He had subarachnoid hemorrhage while on IV heparin, right third toe distal necrosis,was seen by podiatry, and family wanted conservative management.  Since his tertiary care needs have been completed, he was transferred back to [facility] for further management.  Therapy eval recommending SNF/ CIR.  But son is adamant about going home.   Assessment & Plan    Assessment and Plan:   MRSA MV /TV endocarditis Continue with vancomycin as per pharmacy. .  ID consulted.  EOT is 1/29. Patient remains afebrile, WBC count within normal limits       Brain abscess s/p craniotomy and abscess drainage on 12/4 From MRSA. On IV vancomycin.    ESRD on HD MWF Nephrology on board and appreciate recommendations     Type 1 DM insulin dependent.  CBG (last 3)  Recent Labs    09/10/23 1800 09/10/23 2250 09/11/23 0752  GLUCAP 110* 237* 445*    Increase semglee to 6 units , add novolog 2 units TIDAC and SSI.  Elevated CBG this morning probably secondary to missing NovoLog dose yesterday evening and night. Patient noncompliant to diet,  despite education. He is very adamant about being on regular diet      Gastroparesis  Resume PPI.    Left  internal jugular DVT Resume eliquis.      Subarachnoid hemorrhage while on Heparin.  Monitor.      Right third toe necrosis Was seen by podiatry at [facility] recommended surgical intervention, but family opted for conservative management with IV antibiotics     Prostate fluid collection.  Dilated Seminal vesicle and no surgical intervention needed.      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  Monitor.    Anemia of chronic disease Hemoglobin around 8 and stable No obvious signs of bleeding     RN Pressure Injury Documentation: Pressure Injury 08/03/23 Buttocks Left Stage 2 -  Partial thickness loss of dermis presenting as a shallow open injury with a red, pink wound bed without slough. Several small open areas, right buttocks area (Active)  08/03/23 1945  Location: Buttocks  Location Orientation: Left  Staging: Stage 2 -  Partial thickness loss of dermis presenting as a shallow open injury with  a red, pink wound bed without slough.  Wound Description (Comments): Several small open areas, right buttocks area  Present on Admission: No  Dressing Type Foam - Lift dressing to assess site every shift 09/10/23 2000     Estimated body mass index is 26.17 kg/m as calculated from the following:   Height as of 07/19/23: 5\' 7"  (1.702 m).   Weight as of this encounter: 75.8 kg.  Code Status: full code.  DVT Prophylaxis:   apixaban (ELIQUIS) tablet 5 mg   Level of Care: Level of care: Med-Surg Family Communication: FAMILY AT BEDSIDE.   Disposition Plan:     Remains inpatient appropriate:  pending SNF.   Procedures:  None.   Consultants:   Nephrology ID.   Antimicrobials:   Anti-infectives (From admission, onward)    Start     Dose/Rate Route Frequency Ordered Stop   09/10/23 1200  vancomycin (VANCOREADY) IVPB 750 mg/150 mL         750 mg 150 mL/hr over 60 Minutes Intravenous Every M-W-F (Hemodialysis) 09/09/23 2030     09/09/23 2200  linezolid (ZYVOX) tablet 600 mg  Status:  Discontinued        600 mg Oral Every 12 hours 09/09/23 2035 09/10/23 0946        Medications  Scheduled Meds:  apixaban  5 mg Oral BID   calcitRIOL  1.5 mcg Oral Q M,W,F-HD   Chlorhexidine Gluconate Cloth  6 each Topical Daily   Chlorhexidine Gluconate Cloth  6 each Topical Q0600   gabapentin  100 mg Oral TID   heparin sodium (porcine)  4,600 Units Intracatheter Once   insulin aspart  0-9 Units Subcutaneous TID WC   insulin aspart  2 Units Subcutaneous TID WC   insulin glargine-yfgn  6 Units Subcutaneous Daily   loratadine  10 mg Oral Daily   metoprolol tartrate  25 mg Oral BID   pantoprazole  40 mg Oral Daily   QUEtiapine  12.5 mg Oral q morning   QUEtiapine  25 mg Oral QHS   sevelamer carbonate  800 mg Oral TID WC   Continuous Infusions:  vancomycin 750 mg (09/10/23 1908)   PRN Meds:.acetaminophen, hydrALAZINE, oxyCODONE    Subjective:   Toriano Dirusso was seen and examined today.   Discussed with patient and mom at bedside regarding disposition Patient is adamant about going home at this time Objective:   Vitals:   09/10/23 1831 09/10/23 2047 09/11/23 0508 09/11/23 0757  BP: 132/75 130/82 (!) 149/96 (!) 164/110  Pulse: 75  92 95  Resp: 16 19 20 18   Temp: 98.8 F (37.1 C) 98.8 F (37.1 C) 98 F (36.7 C)   TempSrc: Oral Oral Oral   SpO2: 100% 98% 100% 100%  Weight:        Intake/Output Summary (Last 24 hours) at 09/11/2023 1054 Last data filed at 09/11/2023 0300 Gross per 24 hour  Intake 600 ml  Output --  Net 600 ml   Filed Weights   09/10/23 1811  Weight: 75.8 kg     Exam General exam: Niccoli ill-appearing gentleman not in any kind of distress Respiratory system: Clear to auscultation. Respiratory effort normal. Cardiovascular system: S1 & S2 heard, RRR. Gastrointestinal system: Abdomen is soft,  nontender and bowel sounds are good Central nervous system: Alert and oriented. No focal neurological deficits. Extremities: Left AKA Skin: No rashes,  Psychiatry: Mood is appropriate   Data Reviewed:  I have personally reviewed following labs and imaging studies  CBC Lab Results  Component Value Date   WBC 4.8 09/10/2023   RBC 2.92 (L) 09/10/2023   HGB 8.4 (L) 09/10/2023   HCT 27.7 (L) 09/10/2023   MCV 94.9 09/10/2023   MCH 28.8 09/10/2023   PLT 192 09/10/2023   MCHC 30.3 09/10/2023   RDW 15.5 09/10/2023   LYMPHSABS 1.0 09/10/2023   MONOABS 0.4 09/10/2023   EOSABS 0.3 09/10/2023   BASOSABS 0.0 09/10/2023     Last metabolic panel Lab Results  Component Value Date   NA 133 (L) 09/11/2023   K 5.1 09/11/2023   CL 96 (L) 09/11/2023   CO2 23 09/11/2023   BUN 27 (H) 09/11/2023   CREATININE 4.75 (H) 09/11/2023   GLUCOSE 462 (H) 09/11/2023   GFRNONAA 15 (L) 09/11/2023   GFRAA 7 (L) 05/30/2020   CALCIUM 9.5 09/11/2023   PHOS 5.9 (H) 09/10/2023   PROT 7.6 09/09/2023   ALBUMIN 2.9 (L) 09/09/2023   BILITOT 0.8 09/09/2023   ALKPHOS 116 09/09/2023   AST 15 09/09/2023   ALT 12 09/09/2023   ANIONGAP 14 09/11/2023    CBG (last 3)  Recent Labs    09/10/23 1800 09/10/23 2250 09/11/23 0752  GLUCAP 110* 237* 445*      Coagulation Profile: No results for input(s): "INR", "PROTIME" in the last 168 hours.   Radiology Studies: No results found.     Kathlen Mody M.D. [facility] 09/11/2023, 10:54 AM  Available via Epic secure chat 7am-7pm After 7 pm, please refer to night coverage provider listed on amion.

## 2023-09-11 NOTE — Progress Notes (Signed)
[facility] Progress Note  Subjective: seen in room, no /co  Vitals:   09/10/23 2047 09/11/23 0508 09/11/23 0757 09/11/23 1529  BP: 130/82 (!) 149/96 (!) 164/110 104/72  Pulse:  92 95 92  Resp: 19 20 18 20   Temp: 98.8 F (37.1 C) 98 F (36.7 C)  98.8 F (37.1 C)  TempSrc: Oral Oral  Oral  SpO2: 98% 100% 100% 100%  Weight:        Exam:  alert, nad   no jvd  Chest cta bilat  Cor reg no RG  Abd soft ntnd no ascites   LUE 2+ edema    Ext bilat AKA, 1+ L stump edema, none R   Alert, NF, ox3     L femoral TDC intact    Renal-related home meds: - eliquis 5 bid - rocaltrol 1.5 mcg mwf - gabapentin 100 tid - renagel 1 ac tid - metoprolol 25 bid      OP HD: [facility] MWF  4h   400/1.5  77kg (from last admit, no new orders)  L fem TDC 2/3 bath  -  no heparin     Assessment/ Plan: 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 Not a candidate for surgery here. SP line holiday w/ new TDC placed 10/18. Continues on IV vanc . Adjusted plan per ID pharm is 750mg  MWF through 10/27/23.  SP I&D brain abscess: done 12/04 at [facility] Acute bilat CVA: suspected consequence of septic emboli  ESRD: on HD MWF. Not sure last HD. Had HD here last night. Next HD Monday.   Volume: on RA, L arm 2+ edema, no other edema Anemia: Hb 9s stable. ESA dose increased to 100 mcg sq weekly. Will resume here. 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 Add on phos. Cont binders w/ meals.  Hypertension: BP's controlled, cont metoprolol 25 bid.  HD access:  L  femoral TDC placed on 10/18. 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      Vinson Moselle MD  [facility] 09/11/2023, 4:14 PM  Recent Labs  Lab 09/09/23 2033 09/10/23 1418 09/11/23 0828  HGB 9.0* 8.4*  --   ALBUMIN 2.9*  --   --   CALCIUM 9.7 9.5 9.5  PHOS  --  5.9*  --   CREATININE 5.24* 6.82* 4.75*  K 4.3 4.8 5.1   No results for input(s): "IRON", "TIBC", "FERRITIN" in the last 168  hours. Inpatient medications:  apixaban  5 mg Oral BID   calcitRIOL  1.5 mcg Oral Q M,W,F-HD   Chlorhexidine Gluconate Cloth  6 each Topical Daily   Chlorhexidine Gluconate Cloth  6 each Topical Q0600   gabapentin  100 mg Oral TID   heparin sodium (porcine)  4,600 Units Intracatheter Once   insulin aspart  0-9 Units Subcutaneous TID WC   insulin aspart  2 Units Subcutaneous TID WC   insulin glargine-yfgn  6 Units Subcutaneous Daily   loratadine  10 mg Oral Daily   metoprolol tartrate  25 mg Oral BID   pantoprazole  40 mg Oral Daily   QUEtiapine  12.5 mg Oral q morning   QUEtiapine  25 mg Oral QHS   sevelamer carbonate  800 mg Oral TID WC    vancomycin 750 mg (09/10/23 1908)   acetaminophen, diphenhydrAMINE, diphenhydrAMINE-zinc acetate, hydrALAZINE, oxyCODONE

## 2023-09-11 NOTE — Progress Notes (Signed)
Went to patient for rounds and noted that he is about to give himself insulin, this writer stopped him and explained to him the danger of administering himself without the knowledge of the nurse.   Patient arguing that he knows himself and that we are not managing his BS well. Assured the patient that we are doing our best to help him but if he is doing something without [place] knowing then it will be very difficult for the medical team.   MD made aware,

## 2023-09-11 NOTE — Plan of Care (Signed)
  Problem: Nutrition: Goal: Adequate nutrition will be maintained Outcome: Progressing   Problem: Pain Management: Goal: General experience of comfort will improve Outcome: Progressing   Problem: Safety: Goal: Ability to remain free from injury will improve Outcome: Progressing   Problem: Metabolic: Goal: Ability to maintain appropriate glucose levels will improve Outcome: Progressing   Problem: Nutritional: Goal: Maintenance of adequate nutrition will improve Outcome: Progressing

## 2023-09-12 ENCOUNTER — Encounter (HOSPITAL_COMMUNITY): Payer: Self-pay | Admitting: Internal Medicine

## 2023-09-12 DIAGNOSIS — B9562 Methicillin resistant Staphylococcus aureus infection as the cause of diseases classified elsewhere: Secondary | ICD-10-CM | POA: Diagnosis not present

## 2023-09-12 DIAGNOSIS — L089 Local infection of the skin and subcutaneous tissue, unspecified: Secondary | ICD-10-CM | POA: Diagnosis not present

## 2023-09-12 DIAGNOSIS — N250 Renal osteodystrophy: Secondary | ICD-10-CM | POA: Diagnosis not present

## 2023-09-12 DIAGNOSIS — K3184 Gastroparesis: Secondary | ICD-10-CM | POA: Diagnosis not present

## 2023-09-12 DIAGNOSIS — D631 Anemia in chronic kidney disease: Secondary | ICD-10-CM | POA: Diagnosis not present

## 2023-09-12 DIAGNOSIS — N186 End stage renal disease: Secondary | ICD-10-CM | POA: Diagnosis not present

## 2023-09-12 DIAGNOSIS — I38 Endocarditis, valve unspecified: Secondary | ICD-10-CM | POA: Diagnosis not present

## 2023-09-12 DIAGNOSIS — I120 Hypertensive chronic kidney disease with stage 5 chronic kidney disease or end stage renal disease: Secondary | ICD-10-CM | POA: Diagnosis not present

## 2023-09-12 DIAGNOSIS — R7881 Bacteremia: Secondary | ICD-10-CM | POA: Diagnosis not present

## 2023-09-12 DIAGNOSIS — I6340 Cerebral infarction due to embolism of unspecified cerebral artery: Secondary | ICD-10-CM | POA: Diagnosis not present

## 2023-09-12 DIAGNOSIS — Z992 Dependence on renal dialysis: Secondary | ICD-10-CM | POA: Diagnosis not present

## 2023-09-12 DIAGNOSIS — E1022 Type 1 diabetes mellitus with diabetic chronic kidney disease: Secondary | ICD-10-CM | POA: Diagnosis not present

## 2023-09-12 DIAGNOSIS — I330 Acute and subacute infective endocarditis: Secondary | ICD-10-CM | POA: Diagnosis not present

## 2023-09-12 LAB — GLUCOSE, CAPILLARY
Glucose-Capillary: 187 mg/dL — ABNORMAL HIGH (ref 70–99)
Glucose-Capillary: 213 mg/dL — ABNORMAL HIGH (ref 70–99)
Glucose-Capillary: 246 mg/dL — ABNORMAL HIGH (ref 70–99)
Glucose-Capillary: 257 mg/dL — ABNORMAL HIGH (ref 70–99)
Glucose-Capillary: 77 mg/dL (ref 70–99)
Glucose-Capillary: 96 mg/dL (ref 70–99)

## 2023-09-12 MED ORDER — SENNOSIDES-DOCUSATE SODIUM 8.6-50 MG PO TABS
2.0000 | ORAL_TABLET | Freq: Two times a day (BID) | ORAL | Status: DC
  Administered 2023-09-12 – 2023-11-20 (×49): 2 via ORAL
  Filled 2023-09-12 (×116): qty 2

## 2023-09-12 MED ORDER — DARBEPOETIN ALFA 100 MCG/0.5ML IJ SOSY
100.0000 ug | PREFILLED_SYRINGE | INTRAMUSCULAR | Status: DC
  Administered 2023-09-19 – 2023-10-31 (×7): 100 ug via SUBCUTANEOUS
  Filled 2023-09-12 (×7): qty 0.5

## 2023-09-12 MED ORDER — CHLORHEXIDINE GLUCONATE CLOTH 2 % EX PADS
6.0000 | MEDICATED_PAD | Freq: Every day | CUTANEOUS | Status: DC
  Administered 2023-09-13 – 2023-09-16 (×4): 6 via TOPICAL

## 2023-09-12 MED ORDER — INSULIN ASPART 100 UNIT/ML IJ SOLN
3.0000 [IU] | Freq: Once | INTRAMUSCULAR | Status: AC
  Administered 2023-09-12: 3 [IU] via SUBCUTANEOUS

## 2023-09-12 NOTE — Progress Notes (Signed)
Pharmacy Antibiotic Note  [name] is a 42 y.o. 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 Pt was transferred to [facility] for MV repair 11/29 and craniotomy with abscess drainage on 12/4. Pharmacy has been consulted for continued Vancomycin dosing.  Spoke with pharmacist at [facility], vancomycin was being dose based on pre-HD levels - pt mostly getting 750mg  with HD but occasionally would get 500mg  instead. Last vanc random (pre-HD level) at [facility] of 19.8 mcg/ml on 12/11 (therapeutic for goal 15-25 mcg/ml) and pt received vanc 750mg  IV 12/11 pm post HD    Plan: - Continue Vancomycin 750 mg IV post HD MWF through 10/27/23 - Will monitor HD tolerance and dosing - random level again on Monday - F/u ID recommendations  Weight: 75.8 kg (167 lb 1.7 oz)  Temp (24hrs), Avg:99.1 F (37.3 C), Min:98.8 F (37.1 C), Max:99.3 F (37.4 C)  Recent Labs  Lab 09/09/23 2033 09/10/23 1418 09/11/23 0828  WBC 5.2 4.8  --   CREATININE 5.24* 6.82* 4.75*    Estimated Creatinine Clearance: 18.7 mL/min (A) (by C-G formula based on SCr of 4.75 mg/dL (H)).    No Known Allergies  Antimicrobials this admission: Vancomycin 10/12 >>(10/27/23) Cefepime 10/21 >> 10/23; restart 10/25 >> 10/29 Linezolid 11/27 >> 12/10   Vancomycin levels  -10/17 preHD>>25 -10/24: VR= 16 -11/1: VR= 24 -11/8 VR 22 -11/15 VR 22  -11/22 VR 28 - held dose x1, decrease to 500mg  -11/28 VR 18 - cont 500mg  MWF 11/29 - Transferred to [facility] -12/11 VR 19.8 (at [facility]) - mostly 750mg  with HD (occasionally 500mg  instead)   Microbiology results: 10/12 MRSA PCR >> positive 10/12 CDiff antigen +, toxin -, PCR - >> negative 10/12 BCx >> 4/4 MRSA 10/13 BCx >> Negative 10/17 BCx >> Negative 10/21 RCx >> Negative 11/26 Bcx >> negative 11/27 Bcx >> Negative  [name] A. [name], PharmD, BCPS, FNKF Clinical Pharmacist [name] Please utilize Amion for appropriate phone number to  reach the unit pharmacist [facility])  09/12/2023 8:09 AM

## 2023-09-12 NOTE — TOC Progression Note (Signed)
Transition of Care [facility]) - Progression Note    Patient Details  Name: [name] MRN: [id number] Date of Birth: 06-30-1981  [facility] CM/SW Contact  Nicanor Bake Phone Number: [phone] 09/12/2023, 11:35 AM  Clinical Narrative:   CSW met with pt at bedside. Pt refused SNF. Pt stated that he is interested in the idea of having a HH nurse to assist with ADLs. Pt stated that he lives with his mother who works full time. Pt requested that his mother not know about the option of a SNF.   TOC will continue following.          Expected Discharge Plan and Services                                               Social Determinants of Health (SDOH) Interventions SDOH Screenings   Food Insecurity: No Food Insecurity (09/09/2023)  Housing: Unknown (09/09/2023)  Transportation Needs: No Transportation Needs (09/09/2023)  Utilities: Not At Risk (09/09/2023)  Alcohol Screen: Low Risk  (02/15/2023)  Depression (PHQ2-9): Low Risk  (02/15/2023)  Financial Resource Strain: Low Risk  (02/15/2023)  Physical Activity: Inactive (02/15/2023)  Social Connections: Socially Integrated (02/15/2023)  Stress: No Stress Concern Present (02/15/2023)  Tobacco Use: Medium Risk (09/01/2023)   Received from [facility]    Readmission Risk Interventions     No data to display

## 2023-09-12 NOTE — Plan of Care (Signed)
  Problem: Education: Goal: Knowledge of General Education information will improve Description: Including pain rating scale, medication(s)/side effects and non-pharmacologic comfort measures Outcome: Progressing   Problem: Health Behavior/Discharge Planning: Goal: Ability to manage health-related needs will improve Outcome: Progressing   Problem: Clinical Measurements: Goal: Ability to maintain clinical measurements within normal limits will improve Outcome: Progressing Goal: Will remain free from infection Outcome: Progressing Goal: Diagnostic test results will improve Outcome: Progressing Goal: Respiratory complications will improve Outcome: Progressing Goal: Cardiovascular complication will be avoided Outcome: Progressing   Problem: Activity: Goal: Risk for activity intolerance will decrease Outcome: Progressing   Problem: Nutrition: Goal: Adequate nutrition will be maintained Outcome: Progressing   Problem: Coping: Goal: Level of anxiety will decrease Outcome: Progressing   Problem: Elimination: Goal: Will not experience complications related to bowel motility Outcome: Progressing Goal: Will not experience complications related to urinary retention Outcome: Progressing   Problem: Pain Management: Goal: General experience of comfort will improve Outcome: Progressing   Problem: Safety: Goal: Ability to remain free from injury will improve Outcome: Progressing   Problem: Skin Integrity: Goal: Risk for impaired skin integrity will decrease Outcome: Progressing   Problem: Education: Goal: Ability to describe self-care measures that may prevent or decrease complications (Diabetes Survival Skills Education) will improve Outcome: Progressing Goal: Individualized Educational Video(s) Outcome: Progressing   Problem: Coping: Goal: Ability to adjust to condition or change in health will improve Outcome: Progressing   Problem: Fluid Volume: Goal: Ability to  maintain a balanced intake and output will improve Outcome: Progressing   Problem: Health Behavior/Discharge Planning: Goal: Ability to identify and utilize available resources and services will improve Outcome: Progressing Goal: Ability to manage health-related needs will improve Outcome: Progressing   Problem: Metabolic: Goal: Ability to maintain appropriate glucose levels will improve Outcome: Progressing   Problem: Nutritional: Goal: Maintenance of adequate nutrition will improve Outcome: Progressing Goal: Progress toward achieving an optimal weight will improve Outcome: Progressing   Problem: Skin Integrity: Goal: Risk for impaired skin integrity will decrease Outcome: Progressing   Problem: Tissue Perfusion: Goal: Adequacy of tissue perfusion will improve Outcome: Progressing

## 2023-09-12 NOTE — Progress Notes (Signed)
Sevelamer night dose (1800 dose) held d/t pt request. Pt has not ordered dinner but is getting ready to. Stated he will call out when tray arrives. Pt requesting to take dose once tray arrives to room.

## 2023-09-12 NOTE — Progress Notes (Signed)
Patient resting quietly in bed with eyes closed. Patient was verbally abusive to staff last evening when he felt his calls were not responded to in timely manners. This RN reminded patient that we (staff) is here to help him and that we will not be verbally abused each time we enter his room to provide care. Patient apologized and was calm and cooperative for a brief period of time. He repeated the same behavior as before the next time he called out with incontinence and need to be cleaned up. This time he threatened to call 911 to say "he was being neglected in the hospital". After reminding and redirecting him to the facts that staff cannot always get to him immediately because there are 32 pts on the unit that also require care, he became more calm and cooperative.  Patient requested bedtime Insulin coverage for which the on-call ordered 3 units. Patient continued to ask for IV pain medications and he has none ordered. Pain meds admin as per orders and he finally fell asleep and slept quietly for several hours. Will continue to monitor progress to d/c. Handoff reported to Sherrill, Charity fundraiser.

## 2023-09-12 NOTE — Progress Notes (Addendum)
[facility] Progress Note  Subjective: seen in room, no /co  Vitals:   09/12/23 0517 09/12/23 0814 09/12/23 1405 09/12/23 1546  BP: (!) 191/82 (!) 151/101  (!) 130/91  Pulse: 98 94  96  Resp: 18 16  18   Temp: 99.3 F (37.4 C)   99.1 F (37.3 C)  TempSrc: Oral   Oral  SpO2: 100% 100%  100%  Weight:   75.7 kg   Height:   5\' 7"  (1.702 m)     Exam:  alert, nad   no jvd  Chest cta bilat  Cor reg no RG  Abd soft ntnd no ascites   LUE 1-2+ edema    Ext L AKA, no edema, no RLE edema   Alert, NF, ox3     L femoral TDC intact    Renal-related home meds: - eliquis 5 bid - rocaltrol 1.5 mcg mwf - gabapentin 100 tid - renagel 1 ac tid - metoprolol 25 bid      OP HD: [facility] MWF  4h   400/1.5  77kg  L fem TDC 2/3 bath  Heparin none       Assessment/ Plan: 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 Not a candidate for surgery here. SP line holiday w/ new TDC placed 10/18. Continues on IV vanc . Adjusted plan per ID pharm is 750mg  MWF through 10/27/23.  SP I&D brain abscess: done 12/04 at [facility]. Mental functioning is much better.  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 Next HD Monday.   Volume: on RA, L arm 2+ edema, no other edema. 2 kg under old dry wt. Cont 2-2.5 L UF goal.  Anemia: Hb 9s stable. ESA dose increased to 100 mcg sq weekly. Will resume here. 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 Cont binders w/ meals.  Hypertension: BP's controlled, cont metoprolol 25 bid.  HD access:  L  femoral TDC placed on 10/18. 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      Vinson Moselle MD  [facility] 09/12/2023, 6:49 PM  Recent Labs  Lab 09/09/23 2033 09/10/23 1418 09/11/23 0828  HGB 9.0* 8.4*  --   ALBUMIN 2.9*  --   --   CALCIUM 9.7 9.5 9.5  PHOS  --  5.9*  --   CREATININE 5.24* 6.82* 4.75*  K 4.3 4.8 5.1   No results for input(s): "IRON", "TIBC", "FERRITIN" in  the last 168 hours. Inpatient medications:  apixaban  5 mg Oral BID   calcitRIOL  1.5 mcg Oral Q M,W,F-HD   Chlorhexidine Gluconate Cloth  6 each Topical Daily   Chlorhexidine Gluconate Cloth  6 each Topical Q0600   clonazepam  0.125 mg Oral Once   gabapentin  100 mg Oral TID   heparin sodium (porcine)  4,600 Units Intracatheter Once   insulin aspart  0-9 Units Subcutaneous TID WC   insulin aspart  2 Units Subcutaneous TID WC   insulin glargine-yfgn  6 Units Subcutaneous Daily   loratadine  10 mg Oral Daily   metoprolol tartrate  25 mg Oral BID   pantoprazole  40 mg Oral Daily   QUEtiapine  12.5 mg Oral q morning   QUEtiapine  25 mg Oral QHS   senna-docusate  2 tablet Oral BID   sevelamer carbonate  800 mg Oral TID WC    vancomycin 750 mg (09/10/23 1908)   acetaminophen, diphenhydrAMINE, diphenhydrAMINE-zinc acetate,  hydrALAZINE, oxyCODONE

## 2023-09-12 NOTE — Progress Notes (Signed)
[facility]                                                                               [name], is a 42 y.o. male, DOB - 1981/02/24, ZOX:[id number] Admit date - 09/09/2023    Outpatient Primary MD for the patient is [name], NP  LOS - 3  days    Brief summary   [name] is a 42 y.o. male with medical history significant of ESRD on hemodialysis, insulin-dependent type 1 diabetes, gastroparesis,  Left AKA, admitted to [facility] on 1011/24 for DKA, Sepsis and hospital course significant for MV AND TV  MRSA endocarditis, followed by acute MCA stroke , left occipital and bilateral cerebellar strokes consistent with septic emboli,  Brain abscess from MRSA, left IJ DVT was transferred to [facility] for MV repair on 11/29.  While at [facility] he underwent cardiac cath showing non obstructive disease, craniotomy with abscess drainage on 09/01/23,. He had subarachnoid hemorrhage while on IV heparin, right third toe distal necrosis,was seen by podiatry, and family wanted conservative management.  Since his tertiary care needs have been completed, he was transferred back to [facility] for further management.  Therapy eval recommending SNF/ CIR.  But son is adamant about going home.   Assessment & Plan    Assessment and Plan:   MRSA MV /TV endocarditis Continue with vancomycin as per pharmacy. .  ID consulted.  EOT is 1/29. Patient remains afebrile, WBC count within normal limits       Brain abscess s/p craniotomy and abscess drainage on 12/4 From MRSA. On IV vancomycin.    ESRD on HD MWF Nephrology on board and appreciate recommendations     Type 1 DM insulin dependent.  CBG (last 3)  Recent Labs    09/12/23 0811 09/12/23 1204 09/12/23 1624  GLUCAP 246* 213* 96    Increase semglee to 6 units , add novolog 2 units TIDAC and SSI.  Elevated CBG this morning probably secondary to missing NovoLog dose yesterday evening and night. Patient noncompliant to diet,  despite education. He is very adamant about being on regular diet      Gastroparesis  Resume PPI.    Left  internal jugular DVT Resume eliquis.      Subarachnoid hemorrhage while on Heparin.  Monitor.      Right third toe necrosis Was seen by podiatry at [facility] recommended surgical intervention, but family opted for conservative management with IV antibiotics     Prostate fluid collection.  Dilated Seminal vesicle and no surgical intervention needed.      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  Monitor.    Anemia of chronic disease Hemoglobin around 8 and stable No obvious signs of bleeding     RN Pressure Injury Documentation: Pressure Injury 08/03/23 Buttocks Left Stage 2 -  Partial thickness loss of dermis presenting as a shallow open injury with a red, pink wound bed without slough. Several small open areas, right buttocks area (Active)  08/03/23 1945  Location: Buttocks  Location Orientation: Left  Staging: Stage 2 -  Partial thickness loss of dermis presenting as a shallow open injury with  a red, pink wound bed without slough.  Wound Description (Comments): Several small open areas, right buttocks area  Present on Admission: No  Dressing Type Foam - Lift dressing to assess site every shift 09/12/23 0800     Estimated body mass index is 26.14 kg/m as calculated from the following:   Height as of this encounter: 5\' 7"  (1.702 m).   Weight as of this encounter: 75.7 kg.  Code Status: full code.  DVT Prophylaxis:   apixaban (ELIQUIS) tablet 5 mg   Level of Care: Level of care: Med-Surg Family Communication: FAMILY AT BEDSIDE.   Disposition Plan:     Remains inpatient appropriate:  pending SNF.   Procedures:  None.   Consultants:   Nephrology ID.   Antimicrobials:   Anti-infectives (From admission, onward)    Start     Dose/Rate Route Frequency Ordered Stop   09/10/23 1200  vancomycin (VANCOREADY) IVPB 750 mg/150 mL         750 mg 150 mL/hr over 60 Minutes Intravenous Every M-W-F (Hemodialysis) 09/09/23 2030     09/09/23 2200  linezolid (ZYVOX) tablet 600 mg  Status:  Discontinued        600 mg Oral Every 12 hours 09/09/23 2035 09/10/23 0946        Medications  Scheduled Meds:  apixaban  5 mg Oral BID   calcitRIOL  1.5 mcg Oral Q M,W,F-HD   Chlorhexidine Gluconate Cloth  6 each Topical Daily   Chlorhexidine Gluconate Cloth  6 each Topical Q0600   clonazepam  0.125 mg Oral Once   gabapentin  100 mg Oral TID   heparin sodium (porcine)  4,600 Units Intracatheter Once   insulin aspart  0-9 Units Subcutaneous TID WC   insulin aspart  2 Units Subcutaneous TID WC   insulin glargine-yfgn  6 Units Subcutaneous Daily   loratadine  10 mg Oral Daily   metoprolol tartrate  25 mg Oral BID   pantoprazole  40 mg Oral Daily   QUEtiapine  12.5 mg Oral q morning   QUEtiapine  25 mg Oral QHS   senna-docusate  2 tablet Oral BID   sevelamer carbonate  800 mg Oral TID WC   Continuous Infusions:  vancomycin 750 mg (09/10/23 1908)   PRN Meds:.acetaminophen, diphenhydrAMINE, diphenhydrAMINE-zinc acetate, hydrALAZINE, oxyCODONE    Subjective:   [name] was seen and examined today.    No new events Objective:   Vitals:   09/12/23 0517 09/12/23 0814 09/12/23 1405 09/12/23 1546  BP: (!) 191/82 (!) 151/101  (!) 130/91  Pulse: 98 94  96  Resp: 18 16  18   Temp: 99.3 F (37.4 C)   99.1 F (37.3 C)  TempSrc: Oral   Oral  SpO2: 100% 100%  100%  Weight:   75.7 kg   Height:   5\' 7"  (1.702 m)     Intake/Output Summary (Last 24 hours) at 09/12/2023 1756 Last data filed at 09/12/2023 1600 Gross per 24 hour  Intake 840 ml  Output 0 ml  Net 840 ml   Filed Weights   09/10/23 1811 09/12/23 1405  Weight: 75.8 kg 75.7 kg     Exam General exam: Appears calm and comfortable  Respiratory system: Clear to auscultation. Respiratory effort normal. Cardiovascular system: S1 & S2 heard, RRR.   Gastrointestinal system: Abdomen is nondistended, soft and nontender.  Central nervous system: Alert and oriented.  Extremities: left AKA.  Skin: No rashes, Psychiatry: anxious and tangential thought process.  Data Reviewed:  I have personally reviewed following labs and imaging studies   CBC Lab Results  Component Value Date   WBC 4.8 09/10/2023   RBC 2.92 (L) 09/10/2023   HGB 8.4 (L) 09/10/2023   HCT 27.7 (L) 09/10/2023   MCV 94.9 09/10/2023   MCH 28.8 09/10/2023   PLT 192 09/10/2023   MCHC 30.3 09/10/2023   RDW 15.5 09/10/2023   LYMPHSABS 1.0 09/10/2023   MONOABS 0.4 09/10/2023   EOSABS 0.3 09/10/2023   BASOSABS 0.0 09/10/2023     Last metabolic panel Lab Results  Component Value Date   NA 133 (L) 09/11/2023   K 5.1 09/11/2023   CL 96 (L) 09/11/2023   CO2 23 09/11/2023   BUN 27 (H) 09/11/2023   CREATININE 4.75 (H) 09/11/2023   GLUCOSE 462 (H) 09/11/2023   GFRNONAA 15 (L) 09/11/2023   GFRAA 7 (L) 05/30/2020   CALCIUM 9.5 09/11/2023   PHOS 5.9 (H) 09/10/2023   PROT 7.6 09/09/2023   ALBUMIN 2.9 (L) 09/09/2023   BILITOT 0.8 09/09/2023   ALKPHOS 116 09/09/2023   AST 15 09/09/2023   ALT 12 09/09/2023   ANIONGAP 14 09/11/2023    CBG (last 3)  Recent Labs    09/12/23 0811 09/12/23 1204 09/12/23 1624  GLUCAP 246* 213* 96      Coagulation Profile: No results for input(s): "INR", "PROTIME" in the last 168 hours.   Radiology Studies: No results found.     Kathlen Mody M.D. [facility] 09/12/2023, 5:56 PM  Available via Epic secure chat 7am-7pm After 7 pm, please refer to night coverage provider listed on amion.

## 2023-09-13 DIAGNOSIS — Z992 Dependence on renal dialysis: Secondary | ICD-10-CM | POA: Diagnosis not present

## 2023-09-13 DIAGNOSIS — E11628 Type 2 diabetes mellitus with other skin complications: Secondary | ICD-10-CM | POA: Diagnosis not present

## 2023-09-13 DIAGNOSIS — L089 Local infection of the skin and subcutaneous tissue, unspecified: Secondary | ICD-10-CM | POA: Diagnosis not present

## 2023-09-13 DIAGNOSIS — B9562 Methicillin resistant Staphylococcus aureus infection as the cause of diseases classified elsewhere: Secondary | ICD-10-CM | POA: Diagnosis not present

## 2023-09-13 DIAGNOSIS — K3184 Gastroparesis: Secondary | ICD-10-CM | POA: Diagnosis not present

## 2023-09-13 DIAGNOSIS — R7881 Bacteremia: Secondary | ICD-10-CM | POA: Diagnosis not present

## 2023-09-13 DIAGNOSIS — I6340 Cerebral infarction due to embolism of unspecified cerebral artery: Secondary | ICD-10-CM | POA: Diagnosis not present

## 2023-09-13 DIAGNOSIS — N250 Renal osteodystrophy: Secondary | ICD-10-CM | POA: Diagnosis not present

## 2023-09-13 DIAGNOSIS — N186 End stage renal disease: Secondary | ICD-10-CM | POA: Diagnosis not present

## 2023-09-13 DIAGNOSIS — E1143 Type 2 diabetes mellitus with diabetic autonomic (poly)neuropathy: Secondary | ICD-10-CM | POA: Diagnosis not present

## 2023-09-13 DIAGNOSIS — I120 Hypertensive chronic kidney disease with stage 5 chronic kidney disease or end stage renal disease: Secondary | ICD-10-CM | POA: Diagnosis not present

## 2023-09-13 DIAGNOSIS — I330 Acute and subacute infective endocarditis: Secondary | ICD-10-CM | POA: Diagnosis not present

## 2023-09-13 DIAGNOSIS — D631 Anemia in chronic kidney disease: Secondary | ICD-10-CM | POA: Diagnosis not present

## 2023-09-13 DIAGNOSIS — I38 Endocarditis, valve unspecified: Secondary | ICD-10-CM | POA: Diagnosis not present

## 2023-09-13 LAB — CBC WITH DIFFERENTIAL/PLATELET
Abs Immature Granulocytes: 0.01 10*3/uL (ref 0.00–0.07)
Basophils Absolute: 0 10*3/uL (ref 0.0–0.1)
Basophils Relative: 0 %
Eosinophils Absolute: 0.3 10*3/uL (ref 0.0–0.5)
Eosinophils Relative: 6 %
HCT: 27.4 % — ABNORMAL LOW (ref 39.0–52.0)
Hemoglobin: 8.3 g/dL — ABNORMAL LOW (ref 13.0–17.0)
Immature Granulocytes: 0 %
Lymphocytes Relative: 14 %
Lymphs Abs: 0.8 10*3/uL (ref 0.7–4.0)
MCH: 28.9 pg (ref 26.0–34.0)
MCHC: 30.3 g/dL (ref 30.0–36.0)
MCV: 95.5 fL (ref 80.0–100.0)
Monocytes Absolute: 0.4 10*3/uL (ref 0.1–1.0)
Monocytes Relative: 8 %
Neutro Abs: 4.2 10*3/uL (ref 1.7–7.7)
Neutrophils Relative %: 72 %
Platelets: 185 10*3/uL (ref 150–400)
RBC: 2.87 MIL/uL — ABNORMAL LOW (ref 4.22–5.81)
RDW: 15.4 % (ref 11.5–15.5)
WBC: 5.8 10*3/uL (ref 4.0–10.5)
nRBC: 0 % (ref 0.0–0.2)

## 2023-09-13 LAB — RENAL FUNCTION PANEL
Albumin: 2.6 g/dL — ABNORMAL LOW (ref 3.5–5.0)
Anion gap: 14 (ref 5–15)
BUN: 56 mg/dL — ABNORMAL HIGH (ref 6–20)
CO2: 24 mmol/L (ref 22–32)
Calcium: 9.8 mg/dL (ref 8.9–10.3)
Chloride: 96 mmol/L — ABNORMAL LOW (ref 98–111)
Creatinine, Ser: 7.19 mg/dL — ABNORMAL HIGH (ref 0.61–1.24)
GFR, Estimated: 9 mL/min — ABNORMAL LOW (ref >60)
Glucose, Bld: 195 mg/dL — ABNORMAL HIGH (ref 70–99)
Phosphorus: 6.6 mg/dL — ABNORMAL HIGH (ref 2.5–4.6)
Potassium: 5.5 mmol/L — ABNORMAL HIGH (ref 3.5–5.1)
Sodium: 134 mmol/L — ABNORMAL LOW (ref 135–145)

## 2023-09-13 LAB — VANCOMYCIN, RANDOM: Vancomycin Rm: 25 ug/mL

## 2023-09-13 LAB — GLUCOSE, CAPILLARY
Glucose-Capillary: 223 mg/dL — ABNORMAL HIGH (ref 70–99)
Glucose-Capillary: 105 mg/dL — ABNORMAL HIGH (ref 70–99)
Glucose-Capillary: 241 mg/dL — ABNORMAL HIGH (ref 70–99)
Glucose-Capillary: 156 mg/dL — ABNORMAL HIGH (ref 70–99)

## 2023-09-13 MED ORDER — PENTAFLUOROPROP-TETRAFLUOROETH EX AERO: 1.0000 | INHALATION_SPRAY | CUTANEOUS | Status: DC | PRN

## 2023-09-13 MED ORDER — NEPRO/CARBSTEADY PO LIQD: 237.0000 mL | ORAL | Status: DC | PRN

## 2023-09-13 MED ORDER — LIDOCAINE HCL (PF) 1 % IJ SOLN: 5.0000 mL | INTRAMUSCULAR | Status: DC | PRN

## 2023-09-13 MED ORDER — CLONAZEPAM 0.25 MG PO TBDP
0.1250 mg | ORAL_TABLET | Freq: Once | ORAL | Status: AC
  Administered 2023-09-13: 0.125 mg via ORAL
  Filled 2023-09-13: qty 0.5
  Filled 2023-09-13: qty 1

## 2023-09-13 MED ORDER — ALTEPLASE 2 MG IJ SOLR: 2.0000 mg | Freq: Once | INTRAMUSCULAR | Status: DC | PRN

## 2023-09-13 MED ORDER — LIDOCAINE-PRILOCAINE 2.5-2.5 % EX CREA: 1.0000 | TOPICAL_CREAM | CUTANEOUS | Status: DC | PRN

## 2023-09-13 MED ORDER — ANTICOAGULANT SODIUM CITRATE 4% (200MG/5ML) IV SOLN: 5.0000 mL | Status: DC | PRN

## 2023-09-13 MED ORDER — HEPARIN SODIUM (PORCINE) 1000 UNIT/ML DIALYSIS: 1000.0000 [IU] | INTRAMUSCULAR | Status: DC | PRN

## 2023-09-13 NOTE — Progress Notes (Signed)
Pharmacy Antibiotic Note  [name] is a 42 y.o. 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 Pt was transferred to [facility] for MV repair 11/29 and craniotomy with abscess drainage on 12/4. Pharmacy has been consulted for continued Vancomycin dosing.  Spoke with pharmacist at [facility], vancomycin was being dose based on pre-HD levels - pt mostly getting 750mg  with HD but occasionally would get 500mg  instead. Last vanc random (pre-HD level) at [facility] of 19.8 mcg/ml on 12/11 (therapeutic for goal 15-25 mcg/ml) and pt received vanc 750mg  IV 12/11 pm post HD    Plan: - Continue Vancomycin 750 mg IV post HD MWF through 10/27/23 - Will monitor HD tolerance and dosing - random level 25 mcg/mL on 12/16 - F/u ID recommendations  Height: 5\' 7"  (170.2 cm) Weight: 75.7 kg (166 lb 14.2 oz) IBW/kg (Calculated) : 66.1  Temp (24hrs), Avg:98.4 F (36.9 C), Min:97.9 F (36.6 C), Max:99.1 F (37.3 C)  Recent Labs  Lab 09/09/23 2033 09/10/23 1418 09/11/23 0828 09/13/23 0721  WBC 5.2 4.8  --   --   CREATININE 5.24* 6.82* 4.75*  --   VANCORANDOM  --   --   --  25    Estimated Creatinine Clearance: 18.7 mL/min (A) (by C-G formula based on SCr of 4.75 mg/dL (H)).    No Known Allergies  Antimicrobials this admission: Vancomycin 10/12 >>(10/27/23) Cefepime 10/21 >> 10/23; restart 10/25 >> 10/29 Linezolid 11/27 >> 12/10   Vancomycin levels  -10/17 preHD>>25 -10/24: VR= 16 -11/1: VR= 24 -11/8 VR 22 -11/15 VR 22  -11/22 VR 28 - held dose x1, decrease to 500mg  -11/28 VR 18 - cont 500mg  MWF 11/29 - Transferred to [facility] -12/11 VR 19.8 (at [facility]) - mostly 750mg  with HD (occasionally 500mg  instead) - 12/16 VR 25 mcg/mL- continue 750 mg iv MWF   Microbiology results: 10/12 MRSA PCR >> positive 10/12 CDiff antigen +, toxin -, PCR - >> negative 10/12 BCx >> 4/4 MRSA 10/13 BCx >> Negative 10/17 BCx >> Negative 10/21 RCx >> Negative 11/26 Bcx >>  negative 11/27 Bcx >> Negative   [name] BS, PharmD, BCPS Clinical Pharmacist 09/13/2023 9:11 AM  Contact: [phone] after 3 PM  "Be curious, not judgmental..." -Debbora Dus

## 2023-09-13 NOTE — Progress Notes (Signed)
PT Cancellation Note  Patient Details Name: [name] MRN: [id number] DOB: 19-Oct-1980   Cancelled Treatment:    Reason Eval/Treat Not Completed: (P) Patient at procedure or test/unavailable (pt off floor at HD dept.) Will continue efforts per PT plan of care later in the day as schedule permits.   [name] [name] 09/13/2023, 10:20 AM

## 2023-09-13 NOTE — TOC CM/SW Note (Signed)
    Durable Medical Equipment  (From admission, onward)           Start     Ordered   09/13/23 1624  For home use only DME 3 n 1  Once        09/13/23 1623   09/13/23 1623  For home use only DME Hospital bed  Once       Comments: Contact person for delivery is Yefri Vanvliet [phone]  Question Answer Comment  Length of Need Lifetime   Patient has (list medical condition): Brain abscess s/p craniotomy and abscess drainage on 12/4, weakness   The above medical condition requires: Patient requires the ability to reposition frequently   Head must be elevated greater than: 45 degrees   Bed type Semi-electric   Hoyer Lift Yes   Support Surface: Gel Overlay      09/13/23 1623           Patient requires bedside commode due to being confine to a room with no bathroom.

## 2023-09-13 NOTE — Progress Notes (Signed)
Physical Therapy Treatment Patient Details Name: [name] MRN: [id number] DOB: December 27, 1980 Today's Date: 09/13/2023   History of Present Illness 42 y.o. male presents to [facility] 09/09/23 as a transfer from [facility] for further management of endocarditis. Pt originally was admitted to [facility] 07/09/23 for DKA and sepsis after being found on the ground. 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 10/17 pt had a R MCA, L occipital, and B cerebellum CVA w/ septic emboli. ICU admit 10/18 w/ intubation 10/18-10/24. Pt was transferred to [facility] for MV repair on 11/29. At [facility], pt had cardiac cath w/ non obstructive disease, SAH, a craniotomy w/ abscess drainage on 12/4, and R third toe distal necrosis. PMH: chronic L AKA, MRSA, IDDM, ESRD on HD MWF, HTN, TEE positive for vegetation on multiple valves, cellulitis, medical noncompliance, narcotic dependence, DM with gastroparesis, L internal jugular DVT.    PT Comments  Pt received in supine, agreeable to therapy session with emphasis on transfer training. Pt needing up to totalA for bed mobility and unable to perform lateral scooting or standing trials with only +1 assist, needing totalA for attempts with poor propulsion/inability to lift his hips off of bed surface. RN and Case Mgr/MD updated on his progress. Pt will need to be transferred to HD recliner Wed via hoyer lift to see if he can tolerate HD in recliner >4 hours if he is to go home. Currently pt not safe to mobilize OOB to wheelchair for these appointments at current level of function and would need +2 heavy lift assist along with hoyer/wheelchair for transfer OOB. Pt reports all his family members work and demonstrates poor insight into his social and mobility needs. Pt requesting PTA update his mother on his progress but she did not answer when PTA called, RN notified. Pt will continue to benefit from skilled rehab in a post acute setting < 3 hours per day to  maximize functional gains before returning home.    If plan is discharge home, recommend the following: Two people to help with walking and/or transfers;Assist for transportation;Supervision due to cognitive status;Help with stairs or ramp for entrance   Can travel by private vehicle     No  Equipment Recommendations  Hoyer lift;Wheelchair (measurements PT);Wheelchair cushion (measurements PT) (roho cushion due to need for prolonged chair for HD apts; slide board likely pending progress)    Recommendations for Other Services Rehab consult     Precautions / Restrictions Precautions Precautions: Fall Precaution Comments: LAKA, L hemiparesis, L neglect, bowel incontinence Restrictions Weight Bearing Restrictions Per Provider Order: No Other Position/Activity Restrictions: LLE prosthetic in his room     Mobility  Bed Mobility Overal bed mobility: Needs Assistance Bed Mobility: Rolling, Sidelying to Sit, Sit to Sidelying Rolling: Mod assist, Used rails, Max assist Sidelying to sit: Used rails, Max assist, HOB elevated     Sit to sidelying: Total assist General bed mobility comments: able to bring R LE towards EOB. Cues to bring L UE across chest prior to rolling. Cues to roll and use rail w/ R UE. Pt needed assist to bring L LE off EOB (pt places limb back on bed multiple times despite cues to dangle it over EOB) and for trunk elevation. Cues to lean onto elbow to scoot opposite hip forwards. ModA to maxA w/ bed pad to scoot towards EOB.    Transfers Overall transfer level: Needs assistance Equipment used:  (back of recliner) Transfers: Sit to/from Stand Sit to Stand:  From elevated surface, Total assist           General transfer comment: Attempt at lateral scooting toward [facility] but pt unable to understand instructions from therapist for RUE placement to assist with scooting and pt tending to push away from R bed rail (when pt supposed to be scooting toward this rail). Pt able to  hold onto top of back of locked recliner seat with R UE and cued to lift hips up to stand vs squat pivot toward HOB but pt unable to follow instructions even with totalA    Ambulation/Gait                   Stairs             Wheelchair Mobility     Tilt Bed    Modified Rankin (Stroke Patients Only)       Balance Overall balance assessment: Needs assistance Sitting-balance support: Single extremity supported, Feet supported Sitting balance-[name]: Poor Sitting balance - Comments: ModA for balance initially, progressed to CGA for static sitting, needs assist to prevent posterior LOB wtih any dynamic task. Postural control: Posterior lean     Standing balance comment: pt unable to stand with +1 assist                            Cognition Arousal: Alert Behavior During Therapy: WFL for tasks assessed/performed Overall Cognitive Status: Impaired/Different from baseline Area of Impairment: Attention, Memory, Following commands, Safety/judgement, Awareness, Problem solving                 Orientation Level: Disoriented to, Time, Situation Current Attention Level: Focused Memory: Decreased short-term memory Following Commands: Follows one step commands inconsistently Safety/Judgement: Decreased awareness of safety, Decreased awareness of deficits Awareness: Intellectual Problem Solving: Slow processing, Decreased initiation, Difficulty sequencing, Requires verbal cues, Requires tactile cues General Comments: Pt needs redirection throughout session to session and answering questions directly, very literal responses. Pt verbalized that the plan is to d/c home with mother by his birthday. pt reports as well that his mother wants him to have maximized therapy, and requests PTA call his mom to update her on his progress, PTA called but no answer, PTA left HIPAA appropriate voicemail. PTA educated on various venues for therapy upon d/c and need for  specialized lift equipment and someone who can operate equipment and physically assist him at all times at his current level of functioning. Pt tending to contradict himself during session on what he wants and has poor insight into deficits and needs at this time. Pt also demonstrates difficulty with two step commands during session        Exercises      General Comments General comments (skin integrity, edema, etc.): VS WFL taken by NT at beginning of session; pt assisted onto bed pan per his request at end of session, RN/NT notified      Pertinent Vitals/Pain Pain Assessment Pain Assessment: Faces Faces Pain Scale: No hurt Breathing: normal Negative Vocalization: none Facial Expression: smiling or inexpressive Body Language: relaxed Consolability: no need to console PAINAD Score: 0 Pain Intervention(s): Monitored during session, Repositioned, Limited activity within patient's tolerance    Home Living                          Prior Function            PT Goals (current goals  can now be found in the care plan section) Acute Rehab PT Goals Patient Stated Goal: to get more therapy and gain independence PT Goal Formulation: With patient Time For Goal Achievement: 09/24/23 Progress towards PT goals: Progressing toward goals    Frequency    Min 1X/week      PT Plan      Co-evaluation              AM-PAC PT "6 Clicks" Mobility   Outcome Measure  Help needed turning from your back to your side while in a flat bed without using bedrails?: Total Help needed moving from lying on your back to sitting on the side of a flat bed without using bedrails?: Total Help needed moving to and from a bed to a chair (including a wheelchair)?: Total Help needed standing up from a chair using your arms (e.g., wheelchair or bedside chair)?: Total Help needed to walk in hospital room?: Total Help needed climbing 3-5 steps with a railing? : Total 6 Click Score: 6     End of Session Equipment Utilized During Treatment: Gait belt Activity Tolerance: Patient tolerated treatment well;Patient limited by fatigue;Other (comment) (loose stools limiting standing/scoot trials) Patient left: in bed;with call bell/phone within reach;with bed alarm set;Other (comment) (on bed pan) Nurse Communication: Mobility status;Other (comment) (on bed pan) PT Visit Diagnosis: Other abnormalities of gait and mobility (R26.89);Other symptoms and signs involving the nervous system (R29.898);Hemiplegia and hemiparesis Hemiplegia - Right/Left: Left Hemiplegia - dominant/non-dominant: Non-dominant Hemiplegia - caused by: Cerebral infarction     Time: 1650-1740 PT Time Calculation (min) (ACUTE ONLY): 50 min  Charges:    $Therapeutic Activity: 38-52 mins PT General Charges $$ ACUTE PT VISIT: 1 Visit                     Carolee Channell P., PTA Acute Rehabilitation Services Secure Chat Preferred 9a-5:30pm Office: [phone]    Angus Palms 09/13/2023, 6:31 PM

## 2023-09-13 NOTE — TOC CM/SW Note (Addendum)
Spoke to patient at bedside. Patient from home with his mother.   PT came today to work with patient however he was in hemodialysis.   He would like to work with PT after he eats. NCM messaged PTA Carly .    He wants to go home at discharge. LAst PT note recommended SNF, patient does not want to go to SNF. Per PT patient needing two person assist , hospital bed, hoyer lift and bedside commode. Explained same to patient. Patient lives with his mother, would like someone to sit with him 6 hours a day while his mother works.   Discussed Personal Care Services through [facility], however, this times a long time to get approved. If Attending agrees to sign paperwork ( usually requires PCP to sign) , NCM will complete initial paperwork .   His mother would be contact person for delivery of DME. However he does not want NCM to call his mother. He requested NCM to come to his room this evening at 6:30 pm when his mother will be visiting. NCM explained NCM works 7 am to 5 pm and will not be available at bedside tonight at 6:30 pm.    Secure chatted attending and PTA.    Tresa Endo with [facility] can accept for home health. Entered orders for home health and DME and secure chatted MD to sign

## 2023-09-13 NOTE — Progress Notes (Signed)
[facility] Progress Note  Subjective: seen on HD, no c/o  Vitals:   09/13/23 1230 09/13/23 1300 09/13/23 1330 09/13/23 1335  BP: (!) 101/41 (!) 89/63 (!) 76/48 108/72  Pulse: 98 92 91 93  Resp: 12 11 10 10   Temp:    97.8 F (36.6 C)  TempSrc:      SpO2: 99% 97% 98% 100%  Weight:    76 kg  Height:        Exam:  alert, nad   no jvd  Chest cta bilat  Cor reg no RG  Abd soft ntnd no ascites   LUE 1-2+ edema    Ext L AKA, no RLE edema   Alert, NF, ox3     L femoral TDC intact    Renal-related home meds: - eliquis 5 bid - rocaltrol 1.5 mcg mwf - gabapentin 100 tid - renagel 1 ac tid - metoprolol 25 bid      OP HD: [facility] MWF  4h   400/1.5  77kg  L fem TDC 2/3 bath  Heparin none   Assessment/ Plan: 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 Not a candidate for surgery here. SP line holiday w/ new TDC placed 10/18. Continues on IV vanc upon return from [place] . Adjusted plan per ID pharm is 750mg  MWF through 10/27/23.  SP I&D brain abscess: done 12/04 at [facility]. Mental functioning is much better.  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 HD today.  Volume: on RA, L arm 1- 2+ edema, no other edema. 2 kg under old dry wt. Cont 2-2.5 L UF goal.  Anemia: Hb 9s stable. ESA dose increased to 100 mcg sq weekly. Will resume here. 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 Cont binders w/ meals.  Hypertension: BP's controlled, cont metoprolol 25 bid.  HD access:  L  femoral TDC placed on 10/18. 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      Vinson Moselle MD  [facility] 09/13/2023, 3:57 PM  Recent Labs  Lab 09/09/23 2033 09/10/23 1418 09/11/23 0828 09/13/23 0952  HGB 9.0* 8.4*  --  8.3*  ALBUMIN 2.9*  --   --  2.6*  CALCIUM 9.7 9.5 9.5 9.8  PHOS  --  5.9*  --  6.6*  CREATININE 5.24* 6.82* 4.75* 7.19*  K 4.3 4.8 5.1 5.5*   No results for input(s): "IRON", "TIBC",  "FERRITIN" in the last 168 hours. Inpatient medications:  apixaban  5 mg Oral BID   calcitRIOL  1.5 mcg Oral Q M,W,F-HD   Chlorhexidine Gluconate Cloth  6 each Topical Daily   Chlorhexidine Gluconate Cloth  6 each Topical Q0600   Chlorhexidine Gluconate Cloth  6 each Topical Q0600   [START ON 09/19/2023] darbepoetin (ARANESP) injection - DIALYSIS  100 mcg Subcutaneous Q Sun-1800   gabapentin  100 mg Oral TID   insulin aspart  0-9 Units Subcutaneous TID WC   insulin aspart  2 Units Subcutaneous TID WC   insulin glargine-yfgn  6 Units Subcutaneous Daily   loratadine  10 mg Oral Daily   metoprolol tartrate  25 mg Oral BID   pantoprazole  40 mg Oral Daily   QUEtiapine  12.5 mg Oral q morning   QUEtiapine  25 mg Oral QHS   senna-docusate  2 tablet Oral BID   sevelamer carbonate  800 mg Oral TID WC    vancomycin 750 mg (09/13/23  1230)   acetaminophen, diphenhydrAMINE, diphenhydrAMINE-zinc acetate, hydrALAZINE, oxyCODONE

## 2023-09-13 NOTE — Plan of Care (Signed)
Patient remains with VS WNL for him and afebrile. He will go fro dialysis today. He has been more calm and cooperative for this RN and NT this shift and he has not had constant diarrhea as night before. Patient states his pain is better controlled but he still requests klonopin twice daily (especially at bedtime) "to help keep him calm". Patient insists he had this ordered at [facility] prior to transferring to [facility]. This ongoing request relayed to Chancie, RN during morning handoff for follow-up and pt is aware. Patient encouraged to cont being cooperative and calm while staff works to help him with his needs as best they are able. Understanding verbalized.

## 2023-09-13 NOTE — Progress Notes (Signed)
 Pt receives out-pt HD at [facility] on MWF. Will assist as needed.   Olivia Canter Renal Navigator [phone]

## 2023-09-13 NOTE — Progress Notes (Signed)
   09/13/23 1335  Vitals  Temp 97.8 F (36.6 C)  Pulse Rate 93  Resp 10  BP 108/72  SpO2 100 %  O2 Device Room Air  Weight 76 kg  Type of Weight Post-Dialysis  Oxygen Therapy  Patient Activity (if Appropriate) In bed  Pulse Oximetry Type Continuous  Oximetry Probe Site Changed No  Post Treatment  Dialyzer Clearance Lightly streaked  Hemodialysis Intake (mL) 0 mL  Liters Processed 61.4  Fluid Removed (mL) 1900 mL  Tolerated HD Treatment Yes  Post-Hemodialysis Comments catheter issues---soft bp--Dr. Arlean Hopping is aware   Received patient in bed to unit.  Alert and oriented.  Informed consent signed and in chart.   TX duration:3.23  Patient tolerated well.  Transported back to the room  Alert, without acute distress.  Hand-off given to patient's nurse.   Access used: L thigh tunnelled DLC Access issues: see progress notes  Total UF removed: 1900 Medication(s) given: vancomycin 750mg  iv x 1   [facility]

## 2023-09-13 NOTE — Progress Notes (Signed)
[facility]                                                                               [name], is a 42 y.o. male, DOB - 08/12/1981, VHQ:[id number] Admit date - 09/09/2023    Outpatient Primary MD for the patient is [name], NP  LOS - 4  days    Brief summary   [name] is a 42 y.o. male with medical history significant of ESRD on hemodialysis, insulin-dependent type 1 diabetes, gastroparesis,  Left AKA, admitted to [facility] on 1011/24 for DKA, Sepsis and hospital course significant for MV AND TV  MRSA endocarditis, followed by acute MCA stroke , left occipital and bilateral cerebellar strokes consistent with septic emboli,  Brain abscess from MRSA, left IJ DVT was transferred to [facility] for MV repair on 11/29.  While at [facility] he underwent cardiac cath showing non obstructive disease, craniotomy with abscess drainage on 09/01/23,. He had subarachnoid hemorrhage while on IV heparin, right third toe distal necrosis,was seen by podiatry, but family wanted conservative management.  Since his tertiary care needs have been completed, he was transferred back to [facility] for further management.  Therapy eval recommending SNF/ CIR.  But patient is adamant about going home.   Assessment & Plan    Assessment and Plan:   MRSA MV /TV endocarditis Complete 8 week treatment with IV vancomycin. He gets IV vancomycin with HD.  ID signed off.  EOT is 1/29. Patient remains afebrile, WBC count within normal limits       Brain abscess s/p craniotomy and abscess drainage on 12/4 From MRSA. On IV vancomycin.    ESRD on HD MWF Nephrology on board and appreciate recommendations     Type 1 DM insulin dependent.  CBG (last 3)  Recent Labs    09/13/23 0802 09/13/23 1525 09/13/23 1704  GLUCAP 223* 105* 241*   Pt non compliant to diet .  Increase semglee to 6 units , add novolog 2 units TIDAC and SSI. Patient noncompliant to diet, despite education. He is very  adamant about being on regular diet      Gastroparesis  Stable.    Left  internal jugular DVT Resume eliquis.      Subarachnoid hemorrhage while on Heparin.  Monitor.      Right third toe necrosis Was seen by podiatry at [facility] recommended surgical intervention, but family opted for conservative management with IV antibiotics     Prostate fluid collection.  Dilated Seminal vesicle and no surgical intervention needed.  Recommend outpatient follow up with urology on discharge.      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  Monitor.    Anemia of chronic disease Hemoglobin around 8 and stable No obvious signs of bleeding      Estimated body mass index is 26.24 kg/m as calculated from the following:   Height as of this encounter: 5\' 7"  (1.702 m).   Weight as of this encounter: 76 kg.  Code Status: full code.  DVT Prophylaxis:   apixaban (ELIQUIS) tablet 5 mg   Level of Care: Level of care: Med-Surg Family Communication:  discussed the  plan with the family over the weekend. Aunt and Mother are aware that patient needs SNF,but patient is adamant about going home..   Disposition Plan:     Remains inpatient appropriate:  SNF vs HH. Family to decide.   Procedures:  None this admission.  Consultants:   Nephrology ID.   Antimicrobials:   Anti-infectives (From admission, onward)    Start     Dose/Rate Route Frequency Ordered Stop   09/10/23 1200  vancomycin (VANCOREADY) IVPB 750 mg/150 mL        750 mg 150 mL/hr over 60 Minutes Intravenous Every M-W-F (Hemodialysis) 09/09/23 2030 10/27/23 2359   09/09/23 2200  linezolid (ZYVOX) tablet 600 mg  Status:  Discontinued        600 mg Oral Every 12 hours 09/09/23 2035 09/10/23 0946        Medications  Scheduled Meds:  apixaban  5 mg Oral BID   calcitRIOL  1.5 mcg Oral Q M,W,F-HD   Chlorhexidine Gluconate Cloth  6 each Topical Daily   Chlorhexidine Gluconate Cloth  6 each Topical  Q0600   Chlorhexidine Gluconate Cloth  6 each Topical Q0600   [START ON 09/19/2023] darbepoetin (ARANESP) injection - DIALYSIS  100 mcg Subcutaneous Q Sun-1800   gabapentin  100 mg Oral TID   insulin aspart  0-9 Units Subcutaneous TID WC   insulin aspart  2 Units Subcutaneous TID WC   insulin glargine-yfgn  6 Units Subcutaneous Daily   loratadine  10 mg Oral Daily   metoprolol tartrate  25 mg Oral BID   pantoprazole  40 mg Oral Daily   QUEtiapine  12.5 mg Oral q morning   QUEtiapine  25 mg Oral QHS   senna-docusate  2 tablet Oral BID   sevelamer carbonate  800 mg Oral TID WC   Continuous Infusions:  vancomycin 750 mg (09/13/23 1230)   PRN Meds:.acetaminophen, diphenhydrAMINE, diphenhydrAMINE-zinc acetate, hydrALAZINE, oxyCODONE    Subjective:   [name] was seen and examined today.  He is restless ,no new complaints.   No new events Objective:   Vitals:   09/13/23 1300 09/13/23 1330 09/13/23 1335 09/13/23 1705  BP: (!) 89/63 (!) 76/48 108/72 (!) 137/92  Pulse: 92 91 93 97  Resp: 11 10 10 17   Temp:   97.8 F (36.6 C) 98.2 F (36.8 C)  TempSrc:      SpO2: 97% 98% 100%   Weight:   76 kg   Height:        Intake/Output Summary (Last 24 hours) at 09/13/2023 1749 Last data filed at 09/13/2023 1335 Gross per 24 hour  Intake --  Output 1900 ml  Net -1900 ml   Filed Weights   09/12/23 1405 09/13/23 0922 09/13/23 1335  Weight: 75.7 kg 77.9 kg 76 kg     Exam General exam: Appears calm and comfortable  Respiratory system: Clear to auscultation. Respiratory effort normal. Cardiovascular system: S1 & S2 heard, RRR. No JVD, murmurs, Gastrointestinal system: Abdomen is nondistended, soft and nontender.  Central nervous system: Alert and oriented to person and place.  Extremities: left AKA.  Skin:right foot toe necrosis.  Psychiatry: restless.    Data Reviewed:  I have personally reviewed following labs and imaging studies   CBC Lab Results  Component Value  Date   WBC 5.8 09/13/2023   RBC 2.87 (L) 09/13/2023   HGB 8.3 (L) 09/13/2023   HCT 27.4 (L) 09/13/2023   MCV 95.5 09/13/2023   MCH 28.9 09/13/2023   PLT  185 09/13/2023   MCHC 30.3 09/13/2023   RDW 15.4 09/13/2023   LYMPHSABS 0.8 09/13/2023   MONOABS 0.4 09/13/2023   EOSABS 0.3 09/13/2023   BASOSABS 0.0 09/13/2023     Last metabolic panel Lab Results  Component Value Date   NA 134 (L) 09/13/2023   K 5.5 (H) 09/13/2023   CL 96 (L) 09/13/2023   CO2 24 09/13/2023   BUN 56 (H) 09/13/2023   CREATININE 7.19 (H) 09/13/2023   GLUCOSE 195 (H) 09/13/2023   GFRNONAA 9 (L) 09/13/2023   GFRAA 7 (L) 05/30/2020   CALCIUM 9.8 09/13/2023   PHOS 6.6 (H) 09/13/2023   PROT 7.6 09/09/2023   ALBUMIN 2.6 (L) 09/13/2023   BILITOT 0.8 09/09/2023   ALKPHOS 116 09/09/2023   AST 15 09/09/2023   ALT 12 09/09/2023   ANIONGAP 14 09/13/2023    CBG (last 3)  Recent Labs    09/13/23 0802 09/13/23 1525 09/13/23 1704  GLUCAP 223* 105* 241*      Coagulation Profile: No results for input(s): "INR", "PROTIME" in the last 168 hours.   Radiology Studies: No results found.     Kathlen Mody M.D. [facility] 09/13/2023, 5:49 PM  Available via Epic secure chat 7am-7pm After 7 pm, please refer to night coverage provider listed on amion.

## 2023-09-13 NOTE — Progress Notes (Signed)
Id brief note  Reviewed information from previous ID team communication Discussed with primary team dr Blake Divine    Patient improving on vanc Positive bcx 07/10/23; repeat negative Mrsa tv/mv endocarditis, prostate abscess, cns emboli/abscess s/p craniotomy 09/01/2023 [facility] urology evaluated and no surgical drainage needed as they deemed the patient had dilated seminal vesicle rather   -plan to finish 8 weeks vancomycin three times per week with dialysis 10/27/23 -needs outpatient urology f/u for prostatic abscess ([facility]?) -id clinic f/u 1/14 @ 945 with Dr Danelle Earthly at [facility] -will sign off

## 2023-09-14 DIAGNOSIS — B9562 Methicillin resistant Staphylococcus aureus infection as the cause of diseases classified elsewhere: Secondary | ICD-10-CM | POA: Diagnosis not present

## 2023-09-14 DIAGNOSIS — Z992 Dependence on renal dialysis: Secondary | ICD-10-CM | POA: Diagnosis not present

## 2023-09-14 DIAGNOSIS — I330 Acute and subacute infective endocarditis: Secondary | ICD-10-CM | POA: Diagnosis not present

## 2023-09-14 DIAGNOSIS — I38 Endocarditis, valve unspecified: Secondary | ICD-10-CM | POA: Diagnosis not present

## 2023-09-14 DIAGNOSIS — N186 End stage renal disease: Secondary | ICD-10-CM | POA: Diagnosis not present

## 2023-09-14 DIAGNOSIS — N250 Renal osteodystrophy: Secondary | ICD-10-CM | POA: Diagnosis not present

## 2023-09-14 DIAGNOSIS — I120 Hypertensive chronic kidney disease with stage 5 chronic kidney disease or end stage renal disease: Secondary | ICD-10-CM | POA: Diagnosis not present

## 2023-09-14 DIAGNOSIS — R7881 Bacteremia: Secondary | ICD-10-CM | POA: Diagnosis not present

## 2023-09-14 DIAGNOSIS — D631 Anemia in chronic kidney disease: Secondary | ICD-10-CM | POA: Diagnosis not present

## 2023-09-14 LAB — GLUCOSE, CAPILLARY
Glucose-Capillary: 174 mg/dL — ABNORMAL HIGH (ref 70–99)
Glucose-Capillary: 279 mg/dL — ABNORMAL HIGH (ref 70–99)
Glucose-Capillary: 173 mg/dL — ABNORMAL HIGH (ref 70–99)
Glucose-Capillary: 61 mg/dL — ABNORMAL LOW (ref 70–99)
Glucose-Capillary: 83 mg/dL (ref 70–99)

## 2023-09-14 LAB — MRSA NEXT GEN BY PCR, NASAL: MRSA by PCR Next Gen: NOT DETECTED

## 2023-09-14 LAB — HEPATITIS B SURFACE ANTIBODY, QUANTITATIVE: Hep B S AB Quant (Post): 307 m[IU]/mL

## 2023-09-14 MED ORDER — CLONAZEPAM 0.125 MG PO TBDP
0.1250 mg | ORAL_TABLET | Freq: Once | ORAL | Status: AC
  Administered 2023-09-14: 0.125 mg via ORAL
  Filled 2023-09-14: qty 1

## 2023-09-14 MED ORDER — CHLORHEXIDINE GLUCONATE CLOTH 2 % EX PADS
6.0000 | MEDICATED_PAD | Freq: Every day | CUTANEOUS | Status: DC
  Administered 2023-09-14 – 2023-09-18 (×4): 6 via TOPICAL

## 2023-09-14 MED ORDER — LOPERAMIDE HCL 2 MG PO CAPS
2.0000 mg | ORAL_CAPSULE | ORAL | Status: DC | PRN
  Administered 2023-09-14 – 2023-10-11 (×8): 2 mg via ORAL
  Filled 2023-09-14 (×9): qty 1

## 2023-09-14 NOTE — Hospital Course (Addendum)
 [name] is a 42 y.o. male with medical history significant of ESRD on hemodialysis (not fully compliant), insulin-dependent type 1 diabetes, gastroparesis, Left AKA, admitted to [facility] on 07/09/23 for DKA, Sepsis and hospital course significant for MV AND TV  MRSA endocarditis, followed by acute MCA stroke , left occipital and bilateral cerebellar strokes consistent with septic emboli,  Brain abscess from MRSA, left IJ DVT was transferred to [facility] for MV repair on 11/29.  While at [facility] he underwent cardiac cath showing non obstructive disease, craniotomy with abscess drainage on 09/01/23,. He had subarachnoid hemorrhage while on IV heparin, right third toe distal necrosis,was seen by podiatry, but family wanted conservative management. Since his tertiary care needs have been completed, he was transferred back to [facility] for further management.  PTOT  recommending SNF and patient remained admitted until SNF bed was found.

## 2023-09-14 NOTE — NC FL2 (Signed)
[place] MEDICAID FL2 LEVEL OF CARE FORM     IDENTIFICATION  Patient Name: [name] Birthdate: Feb 07, 1981 Sex: male Admission Date (Current Location): 09/09/2023  [facility] and [place] Number:  Producer, television/film/video and Address:  [facility], [address], [place], [name] [address]      Provider Number: [id number]  Attending Physician Name and Address:  Lewie Chamber, MD  Relative Name and Phone Number:       Current Level of Care: Hospital Recommended Level of Care: Skilled Nursing Facility Prior Approval Number:    Date Approved/Denied:   PASRR Number: [id number]  Discharge Plan: SNF    Current Diagnoses: Patient Active Problem List   Diagnosis Date Noted   Endocarditis 09/09/2023   Cerebrovascular accident (CVA) due to embolism of cerebral artery ([name]) 08/02/2023   Endocarditis of tricuspid valve 07/27/2023   Cavitary pneumonia 07/27/2023   Perforation of leaflet of mitral valve 07/27/2023   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) 07/27/2023   Hemiparesis affecting left side as late effect of stroke ([name]) 07/27/2023   Dysphagia due to recent stroke 07/27/2023   Cardiogenic shock ([name]) 07/27/2023   Acute blood loss anemia 07/27/2023   Hx of AKA (above knee amputation), left ([name]) 07/27/2023   Subclavian vein occlusion, bilateral ([name]) 07/27/2023   Fatty liver 07/27/2023   Liver mass 07/27/2023   Physical deconditioning 07/27/2023   Protein-calorie malnutrition, severe 07/22/2023   Infective endocarditis of tricuspid valve 07/15/2023   Acute systolic CHF (congestive heart failure) ([name]) 07/14/2023   DCM (dilated cardiomyopathy) ([name]) 07/14/2023   Coronary artery calcification seen on CAT scan 07/14/2023   Endocarditis of mitral valve 07/13/2023   ESRD on hemodialysis ([name]) 07/13/2023   MRSA bacteremia 07/12/2023   Right elbow pain 07/12/2023   Acute metabolic encephalopathy 07/10/2023   Diabetic foot  infection ([name]) 04/09/2023   Elevated LFTs 04/09/2023   Thrombocytopenia ([name]) 04/09/2023   Cellulitis of right leg 05/19/2021   Pressure injury of skin 10/15/2020   Adjustment disorder, unspecified 09/21/2020   Malnutrition of moderate degree 09/13/2020   Charcot's joint of foot, left    Pneumonia due to COVID-19 virus 02/14/2020   Allergy, unspecified, initial encounter 07/17/2019   Diarrhea 04/24/2019   Encounter for orthopedic aftercare following surgical amputation    DM (diabetes mellitus), secondary, uncontrolled, with neurologic complications    Headache 09/23/2018   Anemia of chronic disease 08/18/2018   Unspecified protein-calorie malnutrition ([name]) 02/14/2018   Insulin dependent type 1 diabetes mellitus ([name]) 10/25/2017   Diabetic gastroparesis ([name]) 10/25/2017   Septic shock ([name]) 10/25/2017   Iron deficiency anemia, unspecified 10/28/2016   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   Secondary hyperparathyroidism of renal origin ([name]) 10/28/2016   HTN (hypertension) 08/09/2014   ESRD on dialysis ([name]) 03/19/2014    Orientation RESPIRATION BLADDER Height & Weight     Self, Time, Situation, Place    Continent Weight: 167 lb 8.8 oz (76 kg) Height:  5\' 7"  (170.2 cm)  BEHAVIORAL SYMPTOMS/MOOD NEUROLOGICAL BOWEL NUTRITION STATUS      Incontinent Diet (See DC summary)  AMBULATORY STATUS COMMUNICATION OF NEEDS Skin   Extensive Assist Verbally PU Stage and Appropriate Care   PU Stage 2 Dressing:  (St 2 L buttocks, Sacrum MASD)                   Personal Care Assistance Level of Assistance  Bathing, Feeding, Dressing Bathing Assistance: Maximum assistance Feeding assistance:  Limited assistance Dressing Assistance: Maximum assistance     Functional Limitations Info  Sight Sight Info: Impaired        SPECIAL CARE FACTORS FREQUENCY  PT (By licensed PT), OT (By licensed OT)     PT Frequency: 5x week OT Frequency: 5x week             Contractures Contractures Info: Not present    Additional Factors Info  Code Status, Allergies, Insulin Sliding Scale, Psychotropic Code Status Info: Full Allergies Info: NKA Psychotropic Info: Quetiapine Insulin Sliding Scale Info: See DC summary       Current Medications (09/14/2023):  This is the current hospital active medication list Current Facility-Administered Medications  Medication Dose Route Frequency Provider Last Rate Last Admin   acetaminophen (TYLENOL) tablet 650 mg  650 mg Oral Q4H PRN Kathlen Mody, MD   650 mg at 09/13/23 0551   apixaban (ELIQUIS) tablet 5 mg  5 mg Oral BID Kathlen Mody, MD   5 mg at 09/14/23 1009   calcitRIOL (ROCALTROL) capsule 1.5 mcg  1.5 mcg Oral Q M,W,F-HD Kathlen Mody, MD   1.5 mcg at 09/13/23 1507   Chlorhexidine Gluconate Cloth 2 % PADS 6 each  6 each Topical Daily Kathlen Mody, MD   6 each at 09/12/23 0836   Chlorhexidine Gluconate Cloth 2 % PADS 6 each  6 each Topical Q0600 Delano Metz, MD   6 each at 09/13/23 0600   Chlorhexidine Gluconate Cloth 2 % PADS 6 each  6 each Topical Q0600 Delano Metz, MD   6 each at 09/14/23 0546   Chlorhexidine Gluconate Cloth 2 % PADS 6 each  6 each Topical Q0600 Delano Metz, MD   6 each at 09/14/23 1051   [START ON 09/19/2023] Darbepoetin Alfa (ARANESP) injection 100 mcg  100 mcg Subcutaneous Q Sun-1800 Delano Metz, MD       diphenhydrAMINE (BENADRYL) 12.5 MG/5ML elixir 12.5 mg  12.5 mg Oral Q8H PRN Kathlen Mody, MD   12.5 mg at 09/13/23 2031   diphenhydrAMINE-zinc acetate (BENADRYL) 2-0.1 % cream   Topical BID PRN Kathlen Mody, MD       gabapentin (NEURONTIN) capsule 100 mg  100 mg Oral TID Kathlen Mody, MD   100 mg at 09/14/23 1008   hydrALAZINE (APRESOLINE) injection 10 mg  10 mg Intravenous Q6H PRN Kathlen Mody, MD       insulin aspart (novoLOG) injection 0-9 Units  0-9 Units Subcutaneous TID WC Kathlen Mody, MD   2 Units at 09/14/23 0813   insulin aspart (novoLOG) injection 2  Units  2 Units Subcutaneous TID WC Kathlen Mody, MD   2 Units at 09/14/23 0812   insulin glargine-yfgn [facility]) injection 6 Units  6 Units Subcutaneous Daily Kathlen Mody, MD   6 Units at 09/14/23 0959   loratadine (CLARITIN) tablet 10 mg  10 mg Oral Daily Anthoney Harada, NP   10 mg at 09/14/23 1013   metoprolol tartrate (LOPRESSOR) tablet 25 mg  25 mg Oral BID Kathlen Mody, MD   25 mg at 09/14/23 1008   oxyCODONE (Oxy IR/ROXICODONE) immediate release tablet 5 mg  5 mg Oral Q6H PRN Kathlen Mody, MD   5 mg at 09/14/23 1050   pantoprazole (PROTONIX) EC tablet 40 mg  40 mg Oral Daily Kathlen Mody, MD   40 mg at 09/14/23 1009   QUEtiapine (SEROQUEL) tablet 12.5 mg  12.5 mg Oral q morning Kathlen Mody, MD   12.5 mg at 09/14/23 1008   QUEtiapine (  SEROQUEL) tablet 25 mg  25 mg Oral QHS Kathlen Mody, MD   25 mg at 09/14/23 0025   senna-docusate (Senokot-S) tablet 2 tablet  2 tablet Oral BID Kathlen Mody, MD   2 tablet at 09/13/23 2200   sevelamer carbonate (RENVELA) tablet 800 mg  800 mg Oral TID WC Kathlen Mody, MD   800 mg at 09/14/23 0803   vancomycin (VANCOREADY) IVPB 750 mg/150 mL  750 mg Intravenous Q M,W,F-HD Reome, Elisha Headland, RPH   Stopped at 09/13/23 1330     Discharge Medications: Please see discharge summary for a list of discharge medications.  Relevant Imaging Results:  Relevant Lab Results:   Additional Information SS#: [id number]; HD MWF at [facility], LCSW

## 2023-09-14 NOTE — Progress Notes (Signed)
[facility] Progress Note  Subjective: seen in room, no c/o's.   Vitals:   09/13/23 2200 09/14/23 0429 09/14/23 0759 09/14/23 1008  BP: (!) 133/115 (!) 102/42 (!) 132/44 136/60  Pulse: 98 99 100 (!) 103  Resp:  17 18   Temp:  98.2 F (36.8 C) 98 F (36.7 C)   TempSrc:  Oral Oral   SpO2:  99% 100%   Weight:      Height:        Exam:  alert, nad   no jvd  Chest cta bilat  Cor reg no RG  Abd soft ntnd no ascites   LUE 1-2+ edema    Ext L AKA, no RLE edema   Alert, NF, ox3     L femoral TDC intact    Renal-related home meds: - eliquis 5 bid - rocaltrol 1.5 mcg mwf - gabapentin 100 tid - renagel 1 ac tid - metoprolol 25 bid      OP HD: [facility] MWF  4h   400/1.5  77kg  L fem TDC 2/3 bath  Heparin none   Assessment/ Plan: 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 Not a candidate for surgery here. SP line holiday w/ new TDC placed 10/18. Continues on IV vanc upon return from [place] . Adjusted plan per ID pharm is 750mg  MWF through 10/27/23.  SP I&D brain abscess: done 12/04 at [facility]. Mental functioning is much better.  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 HD tomorrow.  Volume: on RA, L arm 1- 2+ edema, no other edema. 2 kg under old dry wt. Cont 2-2.5 L UF goal.  Anemia: Hb 9s stable. ESA dose increased to 100 mcg sq weekly. Will resume here. 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 Cont binders w/ meals.  Hypertension: BP's controlled, cont metoprolol 25 bid.  HD access:  L  femoral TDC placed on 10/18. 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 Dispo - will plan to dialyze in a chair tomorrow in anticipation of possible dc soon.       Vinson Moselle MD  [facility] 09/14/2023, 4:49 PM  Recent Labs  Lab 09/09/23 2033 09/10/23 1418 09/11/23 0828 09/13/23 0952  HGB 9.0* 8.4*  --  8.3*  ALBUMIN 2.9*  --   --  2.6*  CALCIUM 9.7 9.5 9.5 9.8  PHOS  --  5.9*   --  6.6*  CREATININE 5.24* 6.82* 4.75* 7.19*  K 4.3 4.8 5.1 5.5*   No results for input(s): "IRON", "TIBC", "FERRITIN" in the last 168 hours. Inpatient medications:  apixaban  5 mg Oral BID   calcitRIOL  1.5 mcg Oral Q M,W,F-HD   Chlorhexidine Gluconate Cloth  6 each Topical Daily   Chlorhexidine Gluconate Cloth  6 each Topical Q0600   Chlorhexidine Gluconate Cloth  6 each Topical Q0600   Chlorhexidine Gluconate Cloth  6 each Topical Q0600   [START ON 09/19/2023] darbepoetin (ARANESP) injection - DIALYSIS  100 mcg Subcutaneous Q Sun-1800   gabapentin  100 mg Oral TID   insulin aspart  0-9 Units Subcutaneous TID WC   insulin aspart  2 Units Subcutaneous TID WC   insulin glargine-yfgn  6 Units Subcutaneous Daily   loratadine  10 mg Oral Daily   metoprolol tartrate  25 mg Oral BID   pantoprazole  40 mg Oral Daily   QUEtiapine  12.5 mg Oral q morning  QUEtiapine  25 mg Oral QHS   senna-docusate  2 tablet Oral BID   sevelamer carbonate  800 mg Oral TID WC    vancomycin Stopped (09/13/23 1330)   acetaminophen, diphenhydrAMINE, diphenhydrAMINE-zinc acetate, hydrALAZINE, loperamide, oxyCODONE

## 2023-09-14 NOTE — Progress Notes (Addendum)
Contacted by team this morning regarding pt needing to trial HD in chair tomorrow. Nephrologist to order HD in chair and contacted inpt HD unit to make staff aware of pt's need for HD in chair tomorrow as well. Will assist as needed.   Olivia Canter Renal Navigator [phone]  Addendum at 3:28 pm: Contacted [facility] to confirms pt's days and time for HD appt. Advised that pt's appt time will change at d/c and clinic staff to contact navigator with time once known. Update provided to CSW in case pt agreeable to snf.

## 2023-09-14 NOTE — Progress Notes (Signed)
Occupational Therapy Treatment Patient Details Name: [name] MRN: [id number] DOB: [birth year] Today's Date: 09/14/2023   History of present illness 42 y.o. male presents to [facility] 09/09/23 as a transfer from [facility] for further management of endocarditis. Pt originally was admitted to [facility] 07/09/23 for DKA and sepsis after being found on the ground. 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 10/17 pt had a R MCA, L occipital, and B cerebellum CVA w/ septic emboli. ICU admit 10/18 w/ intubation 10/18-10/24. Pt was transferred to [facility] for MV repair on 11/29. At [facility], pt had cardiac cath w/ non obstructive disease, SAH, a craniotomy w/ abscess drainage on 12/4, and R third toe distal necrosis. PMH: chronic L AKA, MRSA, IDDM, ESRD on HD MWF, HTN, TEE positive for vegetation on multiple valves, cellulitis, medical noncompliance, narcotic dependence, DM with gastroparesis, L internal jugular DVT.   OT comments  Patient received in supine and agreeable to transfer to recliner to address OOB sitting tolerance. Patient performed rolling in bed for pad placement with mod assist to roll to left and max to roll to right and max assist to get to EOB. Patient instructed on sliding board placement and hand placement with max assist to perform. While in recliner patient performed trunk strengthening exercises before asking to use bed pan. Recliner was leaned back and legs raised with patient rolling to side for pan placement. Patient asking to return to bed for bed pan use. Patient encouraged to stay in recliner to address OOB sitting tolerance and patient began demanding to return to bed stating that sitting in recliner was a waste of time due to him going to SNF. Patient informed that to go to SNF he need to show he can tolerate OOB sitting for HD. Patient began using more aggressive language and was assisted back to bed with sliding board and max assist. Patient will benefit  from continued inpatient follow up therapy, <3 hours/day to address bed mobility, transfers, and self care. Acute OT to continue to follow.       If plan is discharge home, recommend the following:  Two people to help with walking and/or transfers;A lot of help with bathing/dressing/bathroom;Assistance with cooking/housework;Assistance with feeding;Help with stairs or ramp for entrance;Assist for transportation;Direct supervision/assist for financial management;Direct supervision/assist for medications management   Equipment Recommendations  Other (comment);Hoyer lift (hospital bed, [facility])    Recommendations for Other Services      Precautions / Restrictions Precautions Precautions: Fall Precaution Comments: LAKA, L hemiparesis, L neglect, bowel incontinence Restrictions Weight Bearing Restrictions Per Provider Order: No Other Position/Activity Restrictions: LLE prosthetic in his room       Mobility Bed Mobility Overal bed mobility: Needs Assistance Bed Mobility: Rolling, Sidelying to Sit, Sit to Sidelying Rolling: Mod assist, Used rails, Max assist Sidelying to sit: Used rails, Max assist, HOB elevated     Sit to sidelying: Max assist General bed mobility comments: Patient performed rolling to place bed pad with mod assist to roll to left and max assist to roll to right. Patient required assistance with trunk and LEs to get to EOB and return to sidelying.    Transfers Overall transfer level: Needs assistance Equipment used: Sliding board Transfers: Bed to chair/wheelchair/BSC            Lateral/Scoot Transfers: Max assist, With slide board General transfer comment: Max assist for lateral scooting to right on sliding board and education on hand placement and body positioning  Balance Overall balance assessment: Needs assistance Sitting-balance support: Single extremity supported, Feet supported Sitting balance-[name]: Poor Sitting balance - Comments: mod assist  for sitting balance on EOB Postural control: Posterior lean                                 ADL either performed or assessed with clinical judgement   ADL Overall ADL's : Needs assistance/impaired                                       General ADL Comments: declined self care tasks    Extremity/Trunk Assessment              Vision       Perception     Praxis      Cognition Arousal: Alert Behavior During Therapy: WFL for tasks assessed/performed Overall Cognitive Status: Impaired/Different from baseline Area of Impairment: Attention, Memory, Following commands, Safety/judgement, Awareness, Problem solving                 Orientation Level: Disoriented to, Time, Situation Current Attention Level: Focused Memory: Decreased short-term memory Following Commands: Follows one step commands inconsistently Safety/Judgement: Decreased awareness of safety, Decreased awareness of deficits Awareness: Intellectual Problem Solving: Slow processing, Decreased initiation, Difficulty sequencing, Requires verbal cues, Requires tactile cues General Comments: Patient agreeable to OT and pleasant initially. After getting into recliner patient asking to return to bed to use bedpan. Patient encouraged to stay in recliner and patient began cursing and stating that it was unnecessary to stay in recliner if he is going to nursing home. Patient informed that to go to SNF he needs to tolerate 4 hours OOB. Patient continued to demand to return to bed.        Exercises Exercises: Other exercises Other Exercises Other Exercises: trunk flexion exercises performed while seated in recliner    Shoulder Instructions       General Comments patient with limited OOB sitting tolerance with aggressive language with encouraged to stay in chair    Pertinent Vitals/ Pain       Pain Assessment Pain Assessment: Faces Faces Pain Scale: No hurt Pain Intervention(s):  Monitored during session  Home Living                                          Prior Functioning/Environment              Frequency  Min 1X/week        Progress Toward Goals  OT Goals(current goals can now be found in the care plan section)  Progress towards OT goals: Progressing toward goals  Acute Rehab OT Goals Patient Stated Goal: go home OT Goal Formulation: With patient Time For Goal Achievement: 09/24/23 Potential to Achieve Goals: Good ADL Goals Pt Will Perform Grooming: with set-up;sitting Pt Will Perform Lower Body Bathing: with mod assist;sit to/from stand Pt Will Perform Lower Body Dressing: sitting/lateral leans;with max assist Pt Will Transfer to Toilet: with max assist;with transfer board;bedside commode Pt Will Perform Toileting - Clothing Manipulation and hygiene: with mod assist;sitting/lateral leans Pt/caregiver will Perform Home Exercise Program: Increased ROM;Left upper extremity Additional ADL Goal #1: pt will complete bed mobility rolling R and L min (A) Additional ADL  Goal #2: pt will progress to eob sitting from [facility] 20 degrees with bed rail min (A) Additional ADL Goal #3: pt will complete basic transfer with sliding board total +2 mod (A)  Plan      Co-evaluation                 AM-PAC OT "6 Clicks" Daily Activity     Outcome Measure   Help from another person eating meals?: A Little (setup) Help from another person taking care of personal grooming?: A Lot Help from another person toileting, which includes using toliet, bedpan, or urinal?: Total Help from another person bathing (including washing, rinsing, drying)?: Total Help from another person to put on and taking off regular upper body clothing?: Total Help from another person to put on and taking off regular lower body clothing?: Total 6 Click Score: 9    End of Session Equipment Utilized During Treatment: Other (comment) (sliding board)  OT Visit  Diagnosis: Other abnormalities of gait and mobility (R26.89);Other symptoms and signs involving the nervous system (R29.898);Hemiplegia and hemiparesis;Muscle weakness (generalized) (M62.81);Other symptoms and signs involving cognitive function Hemiplegia - Right/Left: Left Hemiplegia - dominant/non-dominant: Non-Dominant Hemiplegia - caused by: Cerebral infarction   Activity Tolerance Treatment limited secondary to agitation   Patient Left in bed;with call bell/phone within reach;with nursing/sitter in room   Nurse Communication Mobility status;Precautions        Time: 4098-1191 OT Time Calculation (min): 33 min  Charges: OT General Charges $OT Visit: 1 Visit OT Treatments $Therapeutic Activity: 23-37 mins  Alfonse Flavors, OTA Acute Rehabilitation Services  Office [phone]   Dewain Penning 09/14/2023, 1:17 PM

## 2023-09-14 NOTE — Plan of Care (Signed)
  Problem: Coping: Goal: Level of anxiety will decrease Outcome: Progressing   Problem: Pain Management: Goal: General experience of comfort will improve Outcome: Progressing   Problem: Nutritional: Goal: Maintenance of adequate nutrition will improve Outcome: Progressing

## 2023-09-14 NOTE — Plan of Care (Signed)
  Problem: Nutrition: Goal: Adequate nutrition will be maintained Outcome: Progressing   Problem: Elimination: Goal: Will not experience complications related to bowel motility Outcome: Progressing   

## 2023-09-14 NOTE — TOC Progression Note (Addendum)
Transition of Care (TOC) - Progression Note   Spoke with patient at bedside. NCM received a secure chat that patient was agreeable to short term rehab last night.   NCM spoke to patient at bedside. He states he is NOT agreeable to short term rehab at a SNF, and he will go home at discharge.   Per PT note he is two person assist and they are recommending hoyer lift, hospital bed, wheelchair, sliding board and Roho cushion.   Explained same to patient. Patient states he can get sitters to sit with home at home while his mother works. NCM explained again [facility] through Medicaid takes awhile ( Possibly months ) to get approved and staffed. NCM completed initial paperwork for [facility] and secure chatted MD to see if he will sign. Usually PCP signs.   NCM voiced concerns that if he needs a hoyer lift to get from bed to chair, how will he get to hemodialysis. Patient states if he cannot drive himself to hemodialysis he can use wheelchair transportation through [employer], he has in past.   Patient agreeable for NCM to order hospital bed, hoyer lift, sliding board, bedside commode and roho cushion. Patient reports he has a wheelchair at home already. [name] is point of contact for delivery.   NCM entered orders asked MD to sign. Ordered DME through [facility] with [employer] . If patient received wheelchair through them they can supply roho cushion, if not he will need to receive through the company he got the wheelchair from. Marthann Schiller will check and let NCM know.   NCM called patient's mother [name], no answer left voicemail with NCM's direct call back number. Mother is point of contact for delivery of DME   Also explained to patient tomorrow he will need to sit in chair for hemodialysis to be sure he can tolerate . Patient voiced understanding.   Home health arranged with [facility]. Patient aware they will not visit daily and only be present for a hour when they do.   Again NCM explained [facility] will take  time to be approved by Medicaid .    Asked MD to sign orders.   [name] returned call. Explained all of above.   [name] states patient cannot return home at discharge. NCM took phone to patient's room and placed his mother on speaker phone. She explained to patient that he needs to be stronger before he goes home. Patient still refusing SNF. Patient upset that NCM called his mother. He did give Carly PTA permission to call this evening and she had left a message.   Patient told NCM to "get the hell out of my room." Patient Details  Name: [name] MRN: [id number] Date of Birth: 09/13/1981  Transition of Care [facility]) CM/SW Contact  Nadene Rubins Adria Devon, RN Phone Number: 09/14/2023, 10:28 AM  Clinical Narrative:            Expected Discharge Plan and Services                                               Social Determinants of Health (SDOH) Interventions SDOH Screenings   Food Insecurity: No Food Insecurity (09/09/2023)  Housing: Unknown (09/09/2023)  Transportation Needs: No Transportation Needs (09/09/2023)  Utilities: Not At Risk (09/09/2023)  Alcohol Screen: Low Risk  (02/15/2023)  Depression (PHQ2-9): Low Risk  (02/15/2023)  Financial Resource Strain: Low Risk  (02/15/2023)  Physical Activity: Inactive (02/15/2023)  Social Connections: Socially Integrated (02/15/2023)  Stress: No Stress Concern Present (02/15/2023)  Tobacco Use: Low Risk  (09/12/2023)  Recent Concern: Tobacco Use - Medium Risk (09/01/2023)   Received from [facility]    Readmission Risk Interventions     No data to display

## 2023-09-14 NOTE — Progress Notes (Signed)
Progress Note    [name]   ZOX:[id number]  DOB: May 26, 1981  DOA: 09/09/2023     5 PCP: [name], NP  Initial CC: endocarditis  Hospital Course: [name] is a 42 y.o. 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  While at [facility] he underwent cardiac cath showing non obstructive disease, craniotomy with abscess drainage on 09/01/23,. He had subarachnoid hemorrhage while on IV heparin, right third toe distal necrosis,was seen by podiatry, but family wanted conservative management.  Since his tertiary care needs have been completed, he was transferred back to [facility] for further management.  Therapy eval recommending SNF.  Interval History:  Spent almost 30 minutes bedside talking with the patient this morning.  He exhibits somewhat impaired thought process and requires redirection multiple times.  He very borderline cannot describe what he would do upon returning home especially if he is in an unsafe environment or does not have adequate care necessary to remain at home safely. He also endorses skipping HD sessions at times and despite explanation about being on antibiotics during dialysis, he is still showing some difficulty understanding this concept and why it's important and why he can't skip HD occasionally like he's used to. I continued to explain over and over again.   Assessment and Plan:  MRSA MV /TV endocarditis Brain abscess s/p craniotomy and abscess drainage on 12/4 Complete 8 week treatment with IV vancomycin. He gets IV vancomycin with HD.  ID signed off.   EOT is 1/29. 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 - Currently on seroquel - this is beginning to raise some concern for his ability to have enough capacity for the main issue regarding his disposition; as of 12/17 he is VERY borderline on capacity and I believe he'll need some ongoing assessment to decide if he's adequately understanding the risks/benefits with home vs SNF; furthermore his mom sounds to be main caretaker and is not wanting to bring him home until he's stronger - during my conversation on 12/17 he was amenable to at least hearing about SNF options; said he'd go somewhere "good" if it was found, but otherwise used curse words in regards to if he received other offers (again, why his capacity is questionable, he understands just enough to have it for now in this context) - based on my conversation with patietn on 12/17, I give consent to SW to fax out for offers on SNF beds  Physical deconditioning - Due to prolonged hospitalization especially recently at [facility] - Continue working with therapy as able - see capacity discussion above; continue seeking SNF beds for now   Type 1 DM insulin dependent.  Pt non compliant to diet  - continue semglee - continue SSI and CBG monitoring    Gastroparesis  Stable.    Left  internal  jugular DVT continue eliquis.    Subarachnoid hemorrhage while on Heparin.  Monitor.     Prostate fluid collection.  Dilated Seminal vesicle and no surgical intervention needed.  Recommend outpatient follow up with urology on discharge.    Anemia of chronic disease Hemoglobin around 8 and  stable No obvious signs of bleeding     Estimated body mass index is 26.24 kg/m as calculated from the following:   Height as of this encounter: 5\' 7"  (1.702 m).   Weight as of this encounter: 76 kg.   Old records reviewed in assessment of this patient   DVT prophylaxis:   apixaban (ELIQUIS) tablet 5 mg   Code Status:   Code Status: Full Code  Mobility Assessment (Last 72 Hours)     Mobility Assessment     Row Name 09/14/23 1300 09/13/23 2200 09/13/23 1740 09/13/23 0814 09/12/23 1950   Does patient have an order for bedrest or is patient medically unstable -- No - Continue assessment -- No - Continue assessment No - Continue assessment   What is the highest level of mobility based on the progressive mobility assessment? Level 2 (Chairfast) - Balance while sitting on edge of bed and cannot stand Level 2 (Chairfast) - Balance while sitting on edge of bed and cannot stand Level 2 (Chairfast) - Balance while sitting on edge of bed and cannot stand Level 2 (Chairfast) - Balance while sitting on edge of bed and cannot stand Level 1 (Bedfast) - Unable to balance while sitting on edge of bed   Is the above level different from baseline mobility prior to current illness? -- Yes - Recommend PT order -- Yes - Recommend PT order Yes - Recommend PT order    Row Name 09/12/23 0800 09/11/23 2015         Does patient have an order for bedrest or is patient medically unstable No - Continue assessment No - Continue assessment      What is the highest level of mobility based on the progressive mobility assessment? Level 3 (Stands with assist) - Balance while standing  and cannot march in place --      Is the above level different from baseline mobility prior to current illness? Yes - Recommend PT order --               Barriers to discharge: none Disposition Plan:  U/K Status is: Inpt  Objective: Blood pressure 136/60, pulse (!) 103, temperature 98 F (36.7 C), temperature source Oral,  resp. rate 18, height 5\' 7"  (1.702 m), weight 76 kg, SpO2 100%.  Examination:  Physical Exam Constitutional:      General: He is not in acute distress.    Appearance: Normal appearance.     Comments: Disheveled appearance   HENT:     Head: Normocephalic and atraumatic.     Mouth/Throat:     Mouth: Mucous membranes are moist.  Eyes:     Extraocular Movements: Extraocular movements intact.  Cardiovascular:     Rate and Rhythm: Normal rate and regular rhythm.  Pulmonary:     Effort: Pulmonary effort is normal. No respiratory distress.     Breath sounds: Normal breath sounds. No wheezing.  Abdominal:     General: Bowel sounds are normal. There is no distension.     Palpations: Abdomen is soft.     Tenderness: There is no abdominal tenderness.  Musculoskeletal:     Cervical back: Normal range of motion and neck supple.  Comments: Left AKA noted  Skin:    General: Skin is warm and dry.  Neurological:     Mental Status: He is alert.     Comments: 0/5 strength in LUE  Psychiatric:        Attention and Perception: He is inattentive.        Mood and Affect: Mood is anxious.        Speech: Speech is rapid and pressured and tangential.        Behavior: Behavior is hyperactive.        Cognition and Memory: Cognition is impaired.        Judgment: Judgment is impulsive.      Consultants:  Nephrology   Procedures:    Data Reviewed: Results for orders placed or performed during the hospital encounter of 09/09/23 (from the past 24 hours)  Glucose, capillary     Status: Abnormal   Collection Time: 09/13/23  3:25 PM  Result Value Ref Range   Glucose-Capillary 105 (H) 70 - 99 mg/dL  Glucose, capillary     Status: Abnormal   Collection Time: 09/13/23  5:04 PM  Result Value Ref Range   Glucose-Capillary 241 (H) 70 - 99 mg/dL  Glucose, capillary     Status: Abnormal   Collection Time: 09/13/23  9:25 PM  Result Value Ref Range   Glucose-Capillary 156 (H) 70 - 99 mg/dL  Glucose,  capillary     Status: Abnormal   Collection Time: 09/14/23  8:00 AM  Result Value Ref Range   Glucose-Capillary 174 (H) 70 - 99 mg/dL   Comment 1 Notify RN   Glucose, capillary     Status: Abnormal   Collection Time: 09/14/23  1:04 PM  Result Value Ref Range   Glucose-Capillary 279 (H) 70 - 99 mg/dL   Comment 1 Notify RN     I have reviewed pertinent nursing notes, vitals, labs, and images as necessary. I have ordered labwork to follow up on as indicated.  I have reviewed the last notes from staff over past 24 hours. I have discussed patient's care plan and test results with nursing staff, CM/SW, and other staff as appropriate.  Time spent: Greater than 50% of the 55 minute visit was spent in counseling/coordination of care for the patient as laid out in the A&P.   LOS: 5 days   Lewie Chamber, MD [facility] 09/14/2023, 2:48 PM

## 2023-09-15 DIAGNOSIS — I38 Endocarditis, valve unspecified: Secondary | ICD-10-CM | POA: Diagnosis not present

## 2023-09-15 DIAGNOSIS — I120 Hypertensive chronic kidney disease with stage 5 chronic kidney disease or end stage renal disease: Secondary | ICD-10-CM | POA: Diagnosis not present

## 2023-09-15 DIAGNOSIS — N186 End stage renal disease: Secondary | ICD-10-CM | POA: Diagnosis not present

## 2023-09-15 DIAGNOSIS — B9562 Methicillin resistant Staphylococcus aureus infection as the cause of diseases classified elsewhere: Secondary | ICD-10-CM | POA: Diagnosis not present

## 2023-09-15 DIAGNOSIS — I330 Acute and subacute infective endocarditis: Secondary | ICD-10-CM | POA: Diagnosis not present

## 2023-09-15 DIAGNOSIS — D631 Anemia in chronic kidney disease: Secondary | ICD-10-CM | POA: Diagnosis not present

## 2023-09-15 DIAGNOSIS — Z992 Dependence on renal dialysis: Secondary | ICD-10-CM | POA: Diagnosis not present

## 2023-09-15 DIAGNOSIS — R7881 Bacteremia: Secondary | ICD-10-CM | POA: Diagnosis not present

## 2023-09-15 DIAGNOSIS — N250 Renal osteodystrophy: Secondary | ICD-10-CM | POA: Diagnosis not present

## 2023-09-15 LAB — CBC
HCT: 29.3 % — ABNORMAL LOW (ref 39.0–52.0)
Hemoglobin: 9 g/dL — ABNORMAL LOW (ref 13.0–17.0)
MCH: 29.1 pg (ref 26.0–34.0)
MCHC: 30.7 g/dL (ref 30.0–36.0)
MCV: 94.8 fL (ref 80.0–100.0)
Platelets: 176 10*3/uL (ref 150–400)
RBC: 3.09 MIL/uL — ABNORMAL LOW (ref 4.22–5.81)
RDW: 15.4 % (ref 11.5–15.5)
WBC: 5.5 10*3/uL (ref 4.0–10.5)
nRBC: 0 % (ref 0.0–0.2)

## 2023-09-15 LAB — GLUCOSE, CAPILLARY
Glucose-Capillary: 315 mg/dL — ABNORMAL HIGH (ref 70–99)
Glucose-Capillary: 114 mg/dL — ABNORMAL HIGH (ref 70–99)
Glucose-Capillary: 300 mg/dL — ABNORMAL HIGH (ref 70–99)
Glucose-Capillary: 362 mg/dL — ABNORMAL HIGH (ref 70–99)
Glucose-Capillary: 353 mg/dL — ABNORMAL HIGH (ref 70–99)

## 2023-09-15 LAB — RENAL FUNCTION PANEL
Albumin: 2.7 g/dL — ABNORMAL LOW (ref 3.5–5.0)
Anion gap: 11 (ref 5–15)
BUN: 48 mg/dL — ABNORMAL HIGH (ref 6–20)
CO2: 26 mmol/L (ref 22–32)
Calcium: 9.5 mg/dL (ref 8.9–10.3)
Chloride: 98 mmol/L (ref 98–111)
Creatinine, Ser: 6.23 mg/dL — ABNORMAL HIGH (ref 0.61–1.24)
GFR, Estimated: 11 mL/min — ABNORMAL LOW (ref >60)
Glucose, Bld: 211 mg/dL — ABNORMAL HIGH (ref 70–99)
Phosphorus: 7.1 mg/dL — ABNORMAL HIGH (ref 2.5–4.6)
Potassium: 6.2 mmol/L — ABNORMAL HIGH (ref 3.5–5.1)
Sodium: 135 mmol/L (ref 135–145)

## 2023-09-15 MED ORDER — NEPRO/CARBSTEADY PO LIQD: 237.0000 mL | ORAL | Status: DC | PRN

## 2023-09-15 MED ORDER — OXYCODONE HCL 5 MG PO TABS
5.0000 mg | ORAL_TABLET | Freq: Four times a day (QID) | ORAL | Status: DC | PRN
  Administered 2023-09-16 – 2023-11-24 (×93): 5 mg via ORAL
  Filled 2023-09-15 (×112): qty 1

## 2023-09-15 MED ORDER — ANTICOAGULANT SODIUM CITRATE 4% (200MG/5ML) IV SOLN: 5.0000 mL | Status: DC | PRN

## 2023-09-15 MED ORDER — HEPARIN SODIUM (PORCINE) 1000 UNIT/ML DIALYSIS: 1000.0000 [IU] | INTRAMUSCULAR | Status: DC | PRN

## 2023-09-15 MED ORDER — CLONAZEPAM 0.25 MG PO TBDP: 0.2500 mg | ORAL_TABLET | Freq: Two times a day (BID) | ORAL | Status: DC | PRN

## 2023-09-15 MED ORDER — PENTAFLUOROPROP-TETRAFLUOROETH EX AERO: 1.0000 | INHALATION_SPRAY | CUTANEOUS | Status: DC | PRN

## 2023-09-15 MED ORDER — CLONAZEPAM 0.25 MG PO TBDP
0.2500 mg | ORAL_TABLET | Freq: Two times a day (BID) | ORAL | Status: DC | PRN
  Administered 2023-09-15 – 2023-11-23 (×70): 0.25 mg via ORAL
  Filled 2023-09-15 (×3): qty 1
  Filled 2023-09-15: qty 2
  Filled 2023-09-15: qty 1
  Filled 2023-09-15: qty 2
  Filled 2023-09-15 (×3): qty 1
  Filled 2023-09-15: qty 2
  Filled 2023-09-15 (×2): qty 1
  Filled 2023-09-15: qty 2
  Filled 2023-09-15 (×3): qty 1
  Filled 2023-09-15: qty 2
  Filled 2023-09-15 (×2): qty 1
  Filled 2023-09-15 (×2): qty 2
  Filled 2023-09-15: qty 1
  Filled 2023-09-15: qty 2
  Filled 2023-09-15 (×4): qty 1
  Filled 2023-09-15: qty 2
  Filled 2023-09-15: qty 1
  Filled 2023-09-15 (×2): qty 2
  Filled 2023-09-15: qty 1
  Filled 2023-09-15: qty 2
  Filled 2023-09-15 (×7): qty 1
  Filled 2023-09-15: qty 2
  Filled 2023-09-15 (×5): qty 1
  Filled 2023-09-15 (×3): qty 2
  Filled 2023-09-15: qty 1
  Filled 2023-09-15: qty 2
  Filled 2023-09-15 (×9): qty 1
  Filled 2023-09-15: qty 2
  Filled 2023-09-15: qty 1
  Filled 2023-09-15 (×3): qty 2
  Filled 2023-09-15 (×2): qty 1
  Filled 2023-09-15: qty 2
  Filled 2023-09-15: qty 1
  Filled 2023-09-15: qty 2
  Filled 2023-09-15: qty 1
  Filled 2023-09-15: qty 2
  Filled 2023-09-15 (×3): qty 1

## 2023-09-15 MED ORDER — INSULIN ASPART 100 UNIT/ML IJ SOLN
5.0000 [IU] | Freq: Once | INTRAMUSCULAR | Status: AC
  Administered 2023-09-15: 5 [IU] via SUBCUTANEOUS

## 2023-09-15 MED ORDER — ALTEPLASE 2 MG IJ SOLR: 2.0000 mg | Freq: Once | INTRAMUSCULAR | Status: DC | PRN

## 2023-09-15 MED ORDER — LIDOCAINE HCL (PF) 1 % IJ SOLN: 5.0000 mL | INTRAMUSCULAR | Status: DC | PRN

## 2023-09-15 MED ORDER — LIDOCAINE-PRILOCAINE 2.5-2.5 % EX CREA: 1.0000 | TOPICAL_CREAM | CUTANEOUS | Status: DC | PRN

## 2023-09-15 MED ORDER — HEPARIN SODIUM (PORCINE) 1000 UNIT/ML IJ SOLN
4600.0000 [IU] | INTRAMUSCULAR | Status: DC | PRN
  Administered 2023-09-15 – 2023-09-27 (×5): 4600 [IU]
  Filled 2023-09-15 (×4): qty 5

## 2023-09-15 NOTE — Plan of Care (Signed)
  Problem: Education: Goal: Knowledge of General Education information will improve Description: Including pain rating scale, medication(s)/side effects and non-pharmacologic comfort measures Outcome: Progressing   Problem: Health Behavior/Discharge Planning: Goal: Ability to manage health-related needs will improve Outcome: Progressing   Problem: Clinical Measurements: Goal: Ability to maintain clinical measurements within normal limits will improve Outcome: Progressing Goal: Will remain free from infection Outcome: Progressing Goal: Respiratory complications will improve Outcome: Progressing Goal: Cardiovascular complication will be avoided Outcome: Progressing   Problem: Activity: Goal: Risk for activity intolerance will decrease Outcome: Progressing   Problem: Nutrition: Goal: Adequate nutrition will be maintained Outcome: Progressing   Problem: Coping: Goal: Level of anxiety will decrease Outcome: Progressing   Problem: Elimination: Goal: Will not experience complications related to bowel motility Outcome: Progressing Goal: Will not experience complications related to urinary retention Outcome: Progressing   Problem: Pain Management: Goal: General experience of comfort will improve Outcome: Progressing   Problem: Safety: Goal: Ability to remain free from injury will improve Outcome: Progressing   Problem: Skin Integrity: Goal: Risk for impaired skin integrity will decrease Outcome: Progressing   Problem: Education: Goal: Individualized Educational Video(s) Outcome: Progressing   Problem: Fluid Volume: Goal: Ability to maintain a balanced intake and output will improve Outcome: Progressing   Problem: Health Behavior/Discharge Planning: Goal: Ability to identify and utilize available resources and services will improve Outcome: Progressing Goal: Ability to manage health-related needs will improve Outcome: Progressing   Problem: Nutritional: Goal:  Maintenance of adequate nutrition will improve Outcome: Progressing   Problem: Skin Integrity: Goal: Risk for impaired skin integrity will decrease Outcome: Progressing   Problem: Tissue Perfusion: Goal: Adequacy of tissue perfusion will improve Outcome: Progressing

## 2023-09-15 NOTE — Plan of Care (Signed)
  Problem: Education: Goal: Knowledge of General Education information will improve Description: Including pain rating scale, medication(s)/side effects and non-pharmacologic comfort measures Outcome: Progressing   Problem: Health Behavior/Discharge Planning: Goal: Ability to manage health-related needs will improve Outcome: Progressing   Problem: Clinical Measurements: Goal: Ability to maintain clinical measurements within normal limits will improve Outcome: Progressing Goal: Will remain free from infection Outcome: Progressing   Problem: Activity: Goal: Risk for activity intolerance will decrease Outcome: Progressing   Problem: Nutrition: Goal: Adequate nutrition will be maintained Outcome: Progressing   Problem: Coping: Goal: Level of anxiety will decrease Outcome: Progressing   Problem: Elimination: Goal: Will not experience complications related to bowel motility Outcome: Progressing   Problem: Pain Management: Goal: General experience of comfort will improve Outcome: Progressing   Problem: Skin Integrity: Goal: Risk for impaired skin integrity will decrease Outcome: Progressing   Problem: Skin Integrity: Goal: Risk for impaired skin integrity will decrease Outcome: Progressing

## 2023-09-15 NOTE — Progress Notes (Signed)
Progress Note    [name]   VWU:[id number]  DOB: [birth year]  DOA: 09/09/2023     6 PCP: [name], NP  Initial CC: endocarditis  Hospital Course: [name] is a 42 y.o. 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  While at [facility] he underwent cardiac cath showing non obstructive disease, craniotomy with abscess drainage on 09/01/23,. He had subarachnoid hemorrhage while on IV heparin, right third toe distal necrosis,was seen by podiatry, but family wanted conservative management.  Since his tertiary care needs have been completed, he was transferred back to [facility] for further management.  Therapy eval recommending SNF.  Interval History:  No events overnight.  Saw patient in his room after he returned from dialysis today.  Also had his mom on speaker phone in the room. We again talked about the obstacles for going home and getting to dialysis.  Also talked about his deconditioning. For now, he is still amenable with reviewing SNF offers and mom still does not want him to come home until they can adequately care for him at home.  Rehab remains his only viable option and I have bluntly and explicitly told him that.  Assessment and Plan:  MRSA MV /TV endocarditis Brain abscess s/p craniotomy and abscess drainage on 12/4 Complete 8 week treatment with IV vancomycin. He gets IV vancomycin with HD.  ID signed off.  EOT is 1/29. 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  He displayed better reasoning with his decisions regardless of what they were and at this time he is still at least amenable with entertaining SNF offers. -He does still need ongoing monitoring in case capacity does change but for now after speaking with him further, he does have enough capacity and despite wanting to go home he at least understands he cannot - based on my conversation with patietn on 12/17, I give consent to SW to fax out for offers on SNF beds  Physical deconditioning - Due to prolonged hospitalization especially recently at [facility] - Continue working with therapy as able - see capacity discussion above; continue seeking SNF beds for now   Type 1 DM insulin dependent.  Pt non compliant to diet  - continue semglee - continue SSI and CBG monitoring    Gastroparesis  Stable.    Left  internal jugular  DVT continue eliquis.    Subarachnoid hemorrhage while on Heparin.  Monitor.     Prostate fluid collection.  Dilated Seminal vesicle and no surgical intervention needed.  Recommend outpatient follow up with urology on discharge.    Anemia of chronic disease Hemoglobin around 8 and stable No obvious signs of bleeding      Estimated body mass index is 26.24 kg/m as calculated from the following:   Height as of this encounter: 5\' 7"  (1.702 m).   Weight as of this encounter: 76 kg.   Old records reviewed in assessment of this patient   DVT prophylaxis:   apixaban (ELIQUIS) tablet 5 mg   Code Status:   Code Status: Full Code  Mobility Assessment (Last 72 Hours)     Mobility Assessment     Row Name 09/15/23 0843 09/14/23 2017 09/14/23 1300 09/14/23 0930 09/13/23 2200   Does patient have an order for bedrest or is patient medically unstable Yes- Bedfast (Level 1) - Complete Yes- Bedfast (Level 1) - Complete -- Yes- Bedfast (Level 1) - Complete No - Continue assessment   What is the highest level of mobility based on the progressive mobility assessment? Level 2 (Chairfast) - Balance while sitting on edge of bed and cannot stand Level 2 (Chairfast) - Balance while sitting on edge of bed and cannot stand Level 2 (Chairfast) - Balance while sitting on edge of bed and cannot stand Level 2 (Chairfast) - Balance while sitting on edge of bed and cannot stand Level 2 (Chairfast) - Balance while sitting on edge of bed and cannot stand   Is the above level different from baseline mobility prior to current illness? Yes - Recommend PT order Yes - Recommend PT order -- Yes - Recommend PT order Yes - Recommend PT order    Row Name 09/13/23 1740 09/13/23 0814 09/12/23 1950       Does patient have an order for bedrest or is patient medically unstable -- No - Continue assessment No - Continue assessment     What is the highest level of mobility based on the progressive mobility assessment? Level 2 (Chairfast) - Balance while sitting on edge of bed and cannot stand Level 2 (Chairfast) - Balance while sitting on edge of bed and cannot stand Level 1 (Bedfast) - Unable to balance while sitting on edge of bed     Is the above level different from baseline mobility prior to current illness? -- Yes - Recommend PT order Yes -  Recommend PT order              Barriers to discharge: none Disposition Plan:  U/K Status is: Inpt  Objective: Blood pressure 130/85, pulse (!) 103, temperature 98.7 F (37.1 C), temperature source Oral, resp. rate 17, height 5\' 7"  (1.702 m), weight 76 kg, SpO2 100%.  Examination:  Physical Exam Constitutional:      General: He is not in acute distress.    Appearance: Normal appearance.     Comments: Disheveled appearance   HENT:     Head: Normocephalic and atraumatic.     Mouth/Throat:     Mouth: Mucous membranes are moist.  Eyes:     Extraocular Movements: Extraocular movements intact.  Cardiovascular:     Rate and Rhythm: Normal rate and regular rhythm.  Pulmonary:     Effort: Pulmonary effort is normal. No respiratory distress.     Breath sounds: Normal breath sounds. No wheezing.  Abdominal:     General: Bowel sounds  are normal. There is no distension.     Palpations: Abdomen is soft.     Tenderness: There is no abdominal tenderness.  Musculoskeletal:     Cervical back: Normal range of motion and neck supple.     Comments: Left AKA noted  Skin:    General: Skin is warm and dry.  Neurological:     Mental Status: He is alert.     Comments: 0/5 strength in LUE  Psychiatric:        Attention and Perception: He is inattentive.        Mood and Affect: Mood is anxious.        Speech: Speech is rapid and pressured.        Behavior: Behavior is hyperactive.      Consultants:  Nephrology   Procedures:    Data Reviewed: Results for orders placed or performed during the hospital encounter of 09/09/23 (from the past 24 hours)  Glucose, capillary     Status: Abnormal   Collection Time: 09/14/23  4:58 PM  Result Value Ref Range   Glucose-Capillary 173 (H) 70 - 99 mg/dL   Comment 1 Notify RN   Glucose, capillary     Status: Abnormal   Collection Time: 09/14/23  8:14 PM  Result Value Ref Range   Glucose-Capillary 61 (L) 70 - 99 mg/dL  Glucose, capillary      Status: None   Collection Time: 09/14/23  9:29 PM  Result Value Ref Range   Glucose-Capillary 83 70 - 99 mg/dL  CBC     Status: Abnormal   Collection Time: 09/15/23  7:05 AM  Result Value Ref Range   WBC 5.5 4.0 - 10.5 K/uL   RBC 3.09 (L) 4.22 - 5.81 MIL/uL   Hemoglobin 9.0 (L) 13.0 - 17.0 g/dL   HCT 28.4 (L) 13.2 - 44.0 %   MCV 94.8 80.0 - 100.0 fL   MCH 29.1 26.0 - 34.0 pg   MCHC 30.7 30.0 - 36.0 g/dL   RDW 10.2 72.5 - 36.6 %   Platelets 176 150 - 400 K/uL   nRBC 0.0 0.0 - 0.2 %  Renal function panel     Status: Abnormal   Collection Time: 09/15/23  7:05 AM  Result Value Ref Range   Sodium 135 135 - 145 mmol/L   Potassium 6.2 (H) 3.5 - 5.1 mmol/L   Chloride 98 98 - 111 mmol/L   CO2 26 22 - 32 mmol/L   Glucose, Bld 211 (H) 70 - 99 mg/dL   BUN 48 (H) 6 - 20 mg/dL   Creatinine, Ser 4.40 (H) 0.61 - 1.24 mg/dL   Calcium 9.5 8.9 - 34.7 mg/dL   Phosphorus 7.1 (H) 2.5 - 4.6 mg/dL   Albumin 2.7 (L) 3.5 - 5.0 g/dL   GFR, Estimated 11 (L) >60 mL/min   Anion gap 11 5 - 15  Glucose, capillary     Status: Abnormal   Collection Time: 09/15/23  8:22 AM  Result Value Ref Range   Glucose-Capillary 315 (H) 70 - 99 mg/dL   Comment 1 Notify RN   Glucose, capillary     Status: Abnormal   Collection Time: 09/15/23  1:03 PM  Result Value Ref Range   Glucose-Capillary 114 (H) 70 - 99 mg/dL    I have reviewed pertinent nursing notes, vitals, labs, and images as necessary. I have ordered labwork to follow up on as indicated.  I have reviewed the last notes from staff over past 24  hours. I have discussed patient's care plan and test results with nursing staff, CM/SW, and other staff as appropriate.  Time spent: Greater than 50% of the 55 minute visit was spent in counseling/coordination of care for the patient as laid out in the A&P.   LOS: 6 days   Lewie Chamber, MD [facility] 09/15/2023, 3:27 PM

## 2023-09-15 NOTE — Progress Notes (Signed)
   09/15/23 1415  Mobility  Activity Transferred from chair to bed  Level of Assistance Total care  Assistive Device Other (Comment) (Bedpad)  Activity Response Tolerated fair  Mobility Referral Yes  Mobility visit 1 Mobility  Mobility Specialist Start Time (ACUTE ONLY) 1400  Mobility Specialist Stop Time (ACUTE ONLY) 1415  Mobility Specialist Time Calculation (min) (ACUTE ONLY) 15 min   Mobility Specialist: Progress Note  Requested by NT, Pt agreeable to mobility session - received in bed. Required Total Care +3 using bedpad. Pt concerned about his "spray" and needing to call to get his lunch. Pt seemed very distressed and disturbed throughout session. Opted for use of bedpad d/t pt behavior as pt was eagar to get in the bed and have his lunch ordered. Pt with stool, pericare completed with assistance. Returned to bed with all needs met - call bell within reach. Left with NT present.  Barnie Mort, BS Mobility Specialist Please contact via SecureChat or Rehab office at [phone].

## 2023-09-15 NOTE — Progress Notes (Signed)
PT Cancellation Note  Patient Details Name: [name] MRN: [id number] DOB: January 25, 1981   Cancelled Treatment:    Reason Eval/Treat Not Completed: (P) Patient at procedure or test/unavailable (pt off unit at HD dept, attempt 10:15AM then 1:31PM.) Will continue efforts per PT plan of care as schedule permits.   [name] [name] 09/15/2023, 1:46 PM

## 2023-09-15 NOTE — Progress Notes (Signed)
[facility] Progress Note  Subjective: seen in HD, couldn't sit up in the chair and the chair had to be laid flat or he "would have slipped out of it" per the HD tech.   Vitals:   09/15/23 1230 09/15/23 1300 09/15/23 1306 09/15/23 1313  BP: 101/70 124/84 117/89 130/85  Pulse: (!) 103     Resp: (!) 9 20 15 17   Temp:   98.7 F (37.1 C)   TempSrc:   Oral   SpO2: 98% 99% 100% 100%  Weight:      Height:        Exam:  alert, nad   no jvd  Chest cta bilat  Cor reg no RG  Abd soft ntnd no ascites   LUE 1-2+ edema    Ext L AKA, no RLE edema   Alert, NF, ox3     L femoral TDC intact    Renal-related home meds: - eliquis 5 bid - rocaltrol 1.5 mcg mwf - gabapentin 100 tid - renagel 1 ac tid - metoprolol 25 bid      OP HD: [facility] MWF  4h   400/1.5  77kg  L fem TDC 2/3 bath  Heparin none   Assessment/ Plan: 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 Not a candidate for surgery here. SP line holiday w/ new TDC placed 10/18. Continues on IV vanc upon return from [place] . Adjusted plan per ID pharm is 750mg  MWF through 10/27/23.  SP I&D brain abscess: done 12/04 at [facility]. Mental functioning is much better.  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 HD today.  Volume: on RA, L arm 1- 2+ edema, no other edema. 2 kg under old dry wt. 2 L goal today Anemia: Hb 9s stable. ESA dose increased to 100 mcg sq weekly. Will resume here. 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 Cont binders w/ meals.  Hypertension: BP's controlled, cont metoprolol 25 bid.  HD access:  L  femoral TDC placed on 10/18. 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 Dispo - could not dialyze well in the chair 12/18 today, pt required the chair be in a supine position or he would have slid out of the chair.       Vinson Moselle MD  [facility] 09/15/2023, 3:31 PM  Recent Labs  Lab 09/13/23 0952 09/15/23 0705   HGB 8.3* 9.0*  ALBUMIN 2.6* 2.7*  CALCIUM 9.8 9.5  PHOS 6.6* 7.1*  CREATININE 7.19* 6.23*  K 5.5* 6.2*   No results for input(s): "IRON", "TIBC", "FERRITIN" in the last 168 hours. Inpatient medications:  apixaban  5 mg Oral BID   calcitRIOL  1.5 mcg Oral Q M,W,F-HD   Chlorhexidine Gluconate Cloth  6 each Topical Daily   Chlorhexidine Gluconate Cloth  6 each Topical Q0600   Chlorhexidine Gluconate Cloth  6 each Topical Q0600   Chlorhexidine Gluconate Cloth  6 each Topical Q0600   [START ON 09/19/2023] darbepoetin (ARANESP) injection - DIALYSIS  100 mcg Subcutaneous Q Sun-1800   gabapentin  100 mg Oral TID   insulin aspart  0-9 Units Subcutaneous TID WC   insulin aspart  2 Units Subcutaneous TID WC   insulin glargine-yfgn  6 Units Subcutaneous Daily   loratadine  10 mg Oral Daily   metoprolol tartrate  25 mg Oral BID   pantoprazole  40 mg Oral Daily   QUEtiapine  12.5 mg Oral  q morning   QUEtiapine  25 mg Oral QHS   senna-docusate  2 tablet Oral BID   sevelamer carbonate  800 mg Oral TID WC    heparin sodium (porcine)     vancomycin 750 mg (09/15/23 1432)   acetaminophen, clonazepam, diphenhydrAMINE, diphenhydrAMINE-zinc acetate, heparin sodium (porcine), hydrALAZINE, loperamide, oxyCODONE

## 2023-09-15 NOTE — Progress Notes (Addendum)
Contacted [facility] staff to inquire if a new appt has been determined for pt as of yet. Navigator advised yesterday that pt will have a new appt at d/c. Awaiting response. Will assist as needed.   Olivia Canter Renal Navigator [phone]  Addendum at 12:47 pm: Pt will have a MWF 10:40 am arrival for 11:00 am chair time at [facility] at d/c. Clinic also responded with 3 concerns. Can pt tolerate sitting for more than 4 hrs? How will pt transfer from w/c to HD chair? How will pt be transported to/from HD? Clinic advised that pt received HD in chair today and was able to tolerate per inpt HD RN. Clinic also advised that hoyer lift will likely need to be used for transfer from w/c to HD chair unless pt has major improvements (navigator reviewed last PT note). Clinic advised that navigator will f/u with case management staff regarding concerns about transportation to/from HD. Prior to this hospital admission, pt did not have stable and secure transportation to/from HD. This information was provided to nephrologist, RN CM. and CSW. Clinic advised that pt's final d/c plan will be provided to clinic once confirmed.   Addendum at 2:12 pm: Update received from nephrologist that pt did not tolerate HD in chair. Pt requested to be laid back to avoid sliding out of chair. Treatment team aware of this info and update provided to out-pt clinic as well.

## 2023-09-15 NOTE — Plan of Care (Signed)
  Problem: Education: Goal: Knowledge of General Education information will improve Description: Including pain rating scale, medication(s)/side effects and non-pharmacologic comfort measures Outcome: Progressing   Problem: Health Behavior/Discharge Planning: Goal: Ability to manage health-related needs will improve Outcome: Progressing   Problem: Clinical Measurements: Goal: Ability to maintain clinical measurements within normal limits will improve Outcome: Progressing Goal: Will remain free from infection Outcome: Progressing Goal: Diagnostic test results will improve Outcome: Progressing Goal: Respiratory complications will improve Outcome: Progressing Goal: Cardiovascular complication will be avoided Outcome: Progressing   Problem: Activity: Goal: Risk for activity intolerance will decrease Outcome: Progressing   Problem: Nutrition: Goal: Adequate nutrition will be maintained Outcome: Progressing   Problem: Coping: Goal: Level of anxiety will decrease Outcome: Progressing   Problem: Elimination: Goal: Will not experience complications related to bowel motility Outcome: Progressing Goal: Will not experience complications related to urinary retention Outcome: Progressing   Problem: Pain Management: Goal: General experience of comfort will improve Outcome: Progressing   Problem: Safety: Goal: Ability to remain free from injury will improve Outcome: Progressing   Problem: Skin Integrity: Goal: Risk for impaired skin integrity will decrease Outcome: Progressing   Problem: Education: Goal: Ability to describe self-care measures that may prevent or decrease complications (Diabetes Survival Skills Education) will improve Outcome: Progressing Goal: Individualized Educational Video(s) Outcome: Progressing   Problem: Coping: Goal: Ability to adjust to condition or change in health will improve Outcome: Progressing   Problem: Fluid Volume: Goal: Ability to  maintain a balanced intake and output will improve Outcome: Progressing   Problem: Health Behavior/Discharge Planning: Goal: Ability to identify and utilize available resources and services will improve Outcome: Progressing Goal: Ability to manage health-related needs will improve Outcome: Progressing   Problem: Metabolic: Goal: Ability to maintain appropriate glucose levels will improve Outcome: Progressing   Problem: Nutritional: Goal: Maintenance of adequate nutrition will improve Outcome: Progressing Goal: Progress toward achieving an optimal weight will improve Outcome: Progressing   Problem: Skin Integrity: Goal: Risk for impaired skin integrity will decrease Outcome: Progressing   Problem: Tissue Perfusion: Goal: Adequacy of tissue perfusion will improve Outcome: Progressing

## 2023-09-15 NOTE — Progress Notes (Addendum)
Received patient in dialysis chair to unit.  Alert and oriented.  Informed consent signed and in chart.   TX duration:3.5 hours  Patient tolerated well.  Transported back to the room  Alert, without acute distress.  Hand-off given to patient's nurse.   Access used: Left Femoral HD Cath Access issues: A-V and V-A  Total UF removed: 2L Medication(s) given: none   09/15/23 1306  Vitals  Temp 98.7 F (37.1 C)  Temp Source Oral  BP 117/89  MAP (mmHg) 98  ECG Heart Rate (!) 108  Resp 15  Oxygen Therapy  SpO2 100 %  O2 Device Room Air  During Treatment Monitoring  HD Safety Checks Performed Yes  Intra-Hemodialysis Comments Tx completed;Tolerated well  Dialysis Fluid Bolus Normal Saline  Bolus Amount (mL) 300 mL  Post Treatment  Dialyzer Clearance Lightly streaked  Liters Processed 77.5  Fluid Removed (mL) 2000 mL  Tolerated HD Treatment Yes  Hemodialysis Catheter Left Femoral vein Double lumen Permanent (Tunneled)  Placement Date/Time: 07/16/23 1318   Serial / Lot #: [id number]  Expiration Date: 05/24/26  Time Out: Correct patient;Correct site;Correct procedure  Maximum sterile barrier precautions: Hand hygiene;Cap;Mask;Sterile gown;Sterile gloves;Large sterile ...  Site Condition No complications  Blue Lumen Status Flushed;Heparin locked;Dead end cap in place  Red Lumen Status Flushed;Heparin locked;Dead end cap in place  Purple Lumen Status N/A  Catheter fill solution Heparin 1000 units/ml  Catheter fill volume (Arterial) 2.3 cc  Catheter fill volume (Venous) 2.3  Dressing Type Transparent  Dressing Status Antimicrobial disc in place;Clean, Dry, Intact  Interventions  (deaccessed)  Drainage Description None  Dressing Change Due 09/22/23  Post treatment catheter status Capped and Clamped     Stacie Glaze LPN [facility]

## 2023-09-15 NOTE — Inpatient Diabetes Management (Signed)
Inpatient Diabetes Program Recommendations  AACE/ADA: New Consensus Statement on Inpatient Glycemic Control (2015)  Target Ranges:  Prepandial:   less than 140 mg/dL      Peak postprandial:   less than 180 mg/dL (1-2 hours)      Critically ill patients:  140 - 180 mg/dL   Lab Results  Component Value Date   GLUCAP 315 (H) 09/15/2023   HGBA1C 8.9 (H) 07/10/2023    Review of Glycemic Control  Latest Reference Range & Units 09/14/23 08:00 09/14/23 13:04 09/14/23 14:10 09/14/23 16:58 09/14/23 18:19 09/14/23 20:14 09/14/23 21:29 09/15/23 08:22  Glucose-Capillary 70 - 99 mg/dL 308 (H)  Novolog 4 units 279 (H) Novolog 7 units  173 (H) Novolog 4 units  61 (L) 83 315 (H)  (H): Data is abnormally high (L): Data is abnormally low  Diabetes history: DM1 Outpatient Diabetes medications: Lantus 4 units, Humalog 0-4 units TID Current orders for Inpatient glycemic control: Semglee 6 units every day, Novolog 0-9 units TID and 2 units [facility]   Hypoglycemia last evening due to late administration of correction and meal coverage at 18:19.  Was likely treated with food/drink (not charted).  Was Hyperglycemic this morning-315 mg/dL (likely rebound from treatment of hypoglycemia last night).   Will continue to follow while inpatient.  Thank you, Dulce Sellar, MSN, CDCES Diabetes Coordinator Inpatient Diabetes Program [phone] (team pager from 8a-5p)

## 2023-09-16 ENCOUNTER — Inpatient Hospital Stay (HOSPITAL_COMMUNITY): Payer: Medicare HMO

## 2023-09-16 DIAGNOSIS — I517 Cardiomegaly: Secondary | ICD-10-CM | POA: Diagnosis not present

## 2023-09-16 DIAGNOSIS — Z8679 Personal history of other diseases of the circulatory system: Secondary | ICD-10-CM | POA: Diagnosis not present

## 2023-09-16 DIAGNOSIS — N250 Renal osteodystrophy: Secondary | ICD-10-CM | POA: Diagnosis not present

## 2023-09-16 DIAGNOSIS — D631 Anemia in chronic kidney disease: Secondary | ICD-10-CM | POA: Diagnosis not present

## 2023-09-16 DIAGNOSIS — Z992 Dependence on renal dialysis: Secondary | ICD-10-CM | POA: Diagnosis not present

## 2023-09-16 DIAGNOSIS — I120 Hypertensive chronic kidney disease with stage 5 chronic kidney disease or end stage renal disease: Secondary | ICD-10-CM | POA: Diagnosis not present

## 2023-09-16 DIAGNOSIS — N186 End stage renal disease: Secondary | ICD-10-CM | POA: Diagnosis not present

## 2023-09-16 DIAGNOSIS — I38 Endocarditis, valve unspecified: Secondary | ICD-10-CM | POA: Diagnosis not present

## 2023-09-16 DIAGNOSIS — R7881 Bacteremia: Secondary | ICD-10-CM | POA: Diagnosis not present

## 2023-09-16 DIAGNOSIS — B9562 Methicillin resistant Staphylococcus aureus infection as the cause of diseases classified elsewhere: Secondary | ICD-10-CM | POA: Diagnosis not present

## 2023-09-16 DIAGNOSIS — I330 Acute and subacute infective endocarditis: Secondary | ICD-10-CM | POA: Diagnosis not present

## 2023-09-16 LAB — RENAL FUNCTION PANEL
Albumin: 2.7 g/dL — ABNORMAL LOW (ref 3.5–5.0)
Anion gap: 12 (ref 5–15)
BUN: 39 mg/dL — ABNORMAL HIGH (ref 6–20)
CO2: 24 mmol/L (ref 22–32)
Calcium: 9.7 mg/dL (ref 8.9–10.3)
Chloride: 96 mmol/L — ABNORMAL LOW (ref 98–111)
Creatinine, Ser: 4.8 mg/dL — ABNORMAL HIGH (ref 0.61–1.24)
GFR, Estimated: 15 mL/min — ABNORMAL LOW (ref >60)
Glucose, Bld: 362 mg/dL — ABNORMAL HIGH (ref 70–99)
Phosphorus: 6.7 mg/dL — ABNORMAL HIGH (ref 2.5–4.6)
Potassium: 5.6 mmol/L — ABNORMAL HIGH (ref 3.5–5.1)
Sodium: 132 mmol/L — ABNORMAL LOW (ref 135–145)

## 2023-09-16 LAB — GLUCOSE, CAPILLARY
Glucose-Capillary: 188 mg/dL — ABNORMAL HIGH (ref 70–99)
Glucose-Capillary: 273 mg/dL — ABNORMAL HIGH (ref 70–99)
Glucose-Capillary: 338 mg/dL — ABNORMAL HIGH (ref 70–99)
Glucose-Capillary: 366 mg/dL — ABNORMAL HIGH (ref 70–99)
Glucose-Capillary: 54 mg/dL — ABNORMAL LOW (ref 70–99)
Glucose-Capillary: 55 mg/dL — ABNORMAL LOW (ref 70–99)

## 2023-09-16 MED ORDER — DEXTROSE 50 % IV SOLN
INTRAVENOUS | Status: AC
  Administered 2023-09-16: 25 g via INTRAVENOUS
  Filled 2023-09-16: qty 50

## 2023-09-16 MED ORDER — SODIUM ZIRCONIUM CYCLOSILICATE 10 G PO PACK
10.0000 g | PACK | ORAL | Status: DC
  Administered 2023-09-16 – 2023-09-18 (×2): 10 g via ORAL
  Filled 2023-09-16 (×2): qty 1

## 2023-09-16 MED ORDER — DEXTROSE 50 % IV SOLN
INTRAVENOUS | Status: AC
  Administered 2023-09-16: 12.5 g via INTRAVENOUS
  Filled 2023-09-16: qty 50

## 2023-09-16 MED ORDER — POVIDONE-IODINE 5 % EX SOLN
Freq: Every day | CUTANEOUS | Status: DC
  Administered 2023-10-11 – 2023-11-16 (×3): 1 via TOPICAL
  Filled 2023-09-16 (×4): qty 88.7

## 2023-09-16 MED ORDER — INSULIN ASPART 100 UNIT/ML IJ SOLN
5.0000 [IU] | Freq: Once | INTRAMUSCULAR | Status: AC
  Administered 2023-09-16: 5 [IU] via SUBCUTANEOUS

## 2023-09-16 MED ORDER — CHLORHEXIDINE GLUCONATE CLOTH 2 % EX PADS: 6.0000 | MEDICATED_PAD | Freq: Every day | CUTANEOUS | Status: DC

## 2023-09-16 MED ORDER — POVIDONE-IODINE 5 % EX SOLN: Freq: Every day | CUTANEOUS | Status: DC

## 2023-09-16 MED ORDER — DEXTROSE 50 % IV SOLN: 12.5000 g | INTRAVENOUS | Status: AC

## 2023-09-16 MED ORDER — DEXTROSE 50 % IV SOLN: 25.0000 g | INTRAVENOUS | Status: AC

## 2023-09-16 NOTE — Progress Notes (Signed)
Physical Therapy Treatment Patient Details Name: [name] MRN: [id number] DOB: 1981/04/01 Today's Date: 09/16/2023   History of Present Illness 42 y.o. male presents to [facility] 09/09/23 as a transfer from [facility] for further management of endocarditis. Pt originally was admitted to [facility] 07/09/23 for DKA and sepsis after being found on the ground. 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 10/17 pt had a R MCA, L occipital, and B cerebellum CVA w/ septic emboli. ICU admit 10/18 w/ intubation 10/18-10/24. Pt was transferred to [facility] for MV repair on 11/29. [facility], pt had cardiac cath w/ non obstructive disease, SAH, a craniotomy w/ abscess drainage on 12/4, and R third toe distal necrosis. PMH: chronic L AKA, MRSA, IDDM, ESRD on HD MWF, HTN, TEE positive for vegetation on multiple valves, cellulitis, medical noncompliance, narcotic dependence, DM with gastroparesis, L internal jugular DVT.    PT Comments  Arrived to room to assist staff with transfer from recliner to bed. Pt yelling at staff upon arrival about leaving him in the chair. Able to redirect pt to transfer back to the bed with increased time and active listening skills. Pt required TotalAx2 to scoot hips back into the recliner as pt was attempting to leave the chair upon arrival. Pt was TotalAx2 to squat pivot back into the bed w/ use of bed pad and gait belt. Pt is progressing towards goals. Acute PT to follow.      If plan is discharge home, recommend the following: Two people to help with walking and/or transfers;Assist for transportation;Supervision due to cognitive status;Help with stairs or ramp for entrance   Can travel by private vehicle     No  Equipment Recommendations  Hoyer lift;Wheelchair (measurements PT);Wheelchair cushion (measurements PT) (Roho cushion due to need for prolonged chair for HD apts; slide board likely pending progress)       Precautions / Restrictions  Precautions Precautions: Fall Precaution Comments: LAKA, L hemiparesis, L neglect, bowel incontinence Restrictions Weight Bearing Restrictions Per Provider Order: No Other Position/Activity Restrictions: LLE prosthetic in his room     Mobility  Bed Mobility Overal bed mobility: Needs Assistance Bed Mobility: Sit to Supine       Sit to supine: Max assist, +2 for physical assistance   General bed mobility comments: MaxAx2 for return to supine for LE management and to shift shoulders upon return to bed. TotalAx2 to scoot towards [facility] w/ use of bed pad    Transfers Overall transfer level: Needs assistance Equipment used: 2 person hand held assist Transfers: Bed to chair/wheelchair/BSC      Squat pivot transfers: Total assist, +2 physical assistance     General transfer comment: TotalAx2 to scoot pt's hips back into recliner prior to trnasfer as pt was attempting to get back to bed upon arrival. TotalAx2 to perform squat pivot back to EOB w/ use of bed pad and gait belt.    Ambulation/Gait    General Gait Details: unable         Balance Overall balance assessment: Needs assistance Sitting-balance support: Single extremity supported, Feet supported Sitting balance-[name]: Fair Sitting balance - Comments: MinA to maintain seated balance as pt leans to the right Postural control: Right lateral lean Standing balance support: Bilateral upper extremity supported, During functional activity Standing balance-[name]: Poor Standing balance comment: reliant on UE support         Cognition Arousal: Alert Behavior During Therapy: Agitated, Impulsive Overall Cognitive Status: Impaired/Different from baseline  Area of Impairment: Attention, Memory, Following commands, Safety/judgement, Awareness, Problem solving     Current Attention Level: Focused Memory: Decreased short-term memory Following Commands: Follows one step commands inconsistently Safety/Judgement:  Decreased awareness of safety, Decreased awareness of deficits Awareness: Intellectual Problem Solving: Slow processing, Decreased initiation, Difficulty sequencing, Requires verbal cues, Requires tactile cues General Comments: Pt frustrated upon entry stating he has been in the chair for too long. Demanded to talk to charge nurse and file a formal complaint. Required time and redirecting to allow for transfer. Stated he was comfortable at end of session in the bed. Needed assistance to keep L UE from dropping during transfer, severe L hemibody neglect       General Comments General comments (skin integrity, edema, etc.): VSS on RA      Pertinent Vitals/Pain Pain Assessment Pain Assessment: No/denies pain           PT Goals (current goals can now be found in the care plan section) Acute Rehab PT Goals Patient Stated Goal: to get more therapy and gain independence PT Goal Formulation: With patient Time For Goal Achievement: 09/24/23 Potential to Achieve Goals: Fair Progress towards PT goals: Progressing toward goals    Frequency    Min 1X/week           Co-evaluation PT/OT/SLP Co-Evaluation/Treatment: Yes Reason for Co-Treatment: For patient/therapist safety;To address functional/ADL transfers;Complexity of the patient's impairments (multi-system involvement) PT goals addressed during session: Mobility/safety with mobility;Balance OT goals addressed during session: ADL's and self-care;Strengthening/ROM      AM-PAC PT "6 Clicks" Mobility   Outcome Measure  Help needed turning from your back to your side while in a flat bed without using bedrails?: A Lot Help needed moving from lying on your back to sitting on the side of a flat bed without using bedrails?: Total Help needed moving to and from a bed to a chair (including a wheelchair)?: Total Help needed standing up from a chair using your arms (e.g., wheelchair or bedside chair)?: Total Help needed to walk in hospital  room?: Total Help needed climbing 3-5 steps with a railing? : Total 6 Click Score: 7    End of Session Equipment Utilized During Treatment: Gait belt Activity Tolerance: Patient tolerated treatment well Patient left: with call bell/phone within reach;Other (comment);in bed;with bed alarm set Nurse Communication: Mobility status PT Visit Diagnosis: Other abnormalities of gait and mobility (R26.89);Other symptoms and signs involving the nervous system (R29.898);Hemiplegia and hemiparesis Hemiplegia - Right/Left: Left Hemiplegia - dominant/non-dominant: Non-dominant Hemiplegia - caused by: Cerebral infarction     Time: 8119-1478 PT Time Calculation (min) (ACUTE ONLY): 15 min  Charges:    $Therapeutic Activity: 8-22 mins PT General Charges $$ ACUTE PT VISIT: 1 Visit                     Hilton Cork, PT, DPT Secure Chat Preferred  Rehab Office [phone]    Arturo Morton [facility] 09/16/2023, 4:44 PM

## 2023-09-16 NOTE — Progress Notes (Signed)
Physical Therapy Treatment Patient Details Name: [name] MRN: [id number] DOB: Jan 31, 1981 Today's Date: 09/16/2023   History of Present Illness 42 y.o. male presents to [facility] 09/09/23 as a transfer from [facility] for further management of endocarditis. Pt originally was admitted to [facility] 07/09/23 for DKA and sepsis after being found on the ground. 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 10/17 pt had a R MCA, L occipital, and B cerebellum CVA w/ septic emboli. ICU admit 10/18 w/ intubation 10/18-10/24. Pt was transferred to [facility] for MV repair on 11/29. At [facility], pt had cardiac cath w/ non obstructive disease, SAH, a craniotomy w/ abscess drainage on 12/4, and R third toe distal necrosis. PMH: chronic L AKA, MRSA, IDDM, ESRD on HD MWF, HTN, TEE positive for vegetation on multiple valves, cellulitis, medical noncompliance, narcotic dependence, DM with gastroparesis, L internal jugular DVT.    PT Comments  Pt received in supine, agreeable to therapy session with encouragement, pt with fair awareness of situation and need for heavy +2 assist for OOB mobility and expressing eagerness to now focus on his PT/OT therapies at post-acute facility to get strong so he can work toward going home. Pt with continued LUE neglect and despite cues often unable to maintain attention to LUE AAROM with his RUE. Pt may benefit from a sling for LUE to prevent injury to this arm during transfers and seated tasks, and pt with LUE significant edema, discussed with pt and care team importance of keeping his LUE elevated for edema mgmt. Pt needing up to +2 totalA for squat pivot transfer from bed to chair and up to +2 maxA for sit<>stand prior to pivot transfer. Patient will benefit from continued inpatient follow up therapy, <3 hours/day, he is agreeable this date.     If plan is discharge home, recommend the following: Two people to help with walking and/or transfers;Assist for  transportation;Supervision due to cognitive status;Help with stairs or ramp for entrance   Can travel by private vehicle     No  Equipment Recommendations  Hoyer lift;Wheelchair (measurements PT);Wheelchair cushion (measurements PT) (Roho cushion due to need for prolonged chair for HD apts; slide board likely pending progress)    Recommendations for Other Services       Precautions / Restrictions Precautions Precautions: Fall Precaution Comments: LAKA, L hemiparesis, L neglect, bowel incontinence Restrictions Weight Bearing Restrictions Per Provider Order: No Other Position/Activity Restrictions: LLE prosthetic in his room     Mobility  Bed Mobility Overal bed mobility: Needs Assistance Bed Mobility: Rolling, Sidelying to Sit Rolling: Mod assist, Used rails Sidelying to sit: Mod assist, +2 for physical assistance, HOB elevated, Used rails       General bed mobility comments: instructions to move LUE to assist with bed mobility, patient had difficulty with this task due to neglect, good use of RUE on bed rail to assist with sitting up    Transfers Overall transfer level: Needs assistance Equipment used: 2 person hand held assist Transfers: Sit to/from Stand, Bed to chair/wheelchair/BSC Sit to Stand: Max assist, +2 physical assistance, Via lift equipment     Squat pivot transfers: Total assist, +2 physical assistance     General transfer comment: patient performed stand prior to transfer from EOB with max assist of 2 and cues for balance and posture. Squat pivot transfer to recliner with total assist +2 using gait belt and bed pad support.    Ambulation/Gait  General Gait Details: unable to hop with +2 assist   Stairs             Wheelchair Mobility     Tilt Bed    Modified Rankin (Stroke Patients Only)       Balance Overall balance assessment: Needs assistance Sitting-balance support: Single extremity supported, Feet  supported Sitting balance-[name]: Poor Sitting balance - Comments: CGA to min assist for sitting balance on EOB, tendency to fall/lean to L and posterior when dual tasking needs reminders to self-support at times with RUE on bed rail Postural control: Posterior lean Standing balance support: Bilateral upper extremity supported, During functional activity Standing balance-[name]: Poor Standing balance comment: stood from EOB with UE support and assistance for balance +2A                            Cognition Arousal: Alert Behavior During Therapy: WFL for tasks assessed/performed Overall Cognitive Status: Impaired/Different from baseline Area of Impairment: Attention, Memory, Following commands, Safety/judgement, Awareness, Problem solving                   Current Attention Level: Focused Memory: Decreased short-term memory Following Commands: Follows one step commands inconsistently Safety/Judgement: Decreased awareness of safety, Decreased awareness of deficits Awareness: Intellectual Problem Solving: Slow processing, Decreased initiation, Difficulty sequencing, Requires verbal cues, Requires tactile cues General Comments: cooperative towards therapy, eager to progress to allow for going home, pt with severe L hemibody neglect and when cued to use RUE to assist with LUE movements, he often stops midway through movement/needing therapist to prevent him from dropping/possibly injuring his LUE        Exercises Other Exercises Other Exercises: trunk flexion exercises performed while seated in recliner x5 reps using RUE Other Exercises: Worked on sitting balance at EOB. Pt needs cues and trunk support to prevent backward and L LOB when he lets go of side rail with RUE    General Comments General comments (skin integrity, edema, etc.): no acute s/sx distress with positional changes      Pertinent Vitals/Pain Pain Assessment Pain Assessment: Faces Faces  Pain Scale: No hurt Pain Intervention(s): Monitored during session, Repositioned (geomat cushion in his chair)    Home Living                          Prior Function            PT Goals (current goals can now be found in the care plan section) Acute Rehab PT Goals Patient Stated Goal: to get more therapy and gain independence PT Goal Formulation: With patient Time For Goal Achievement: 09/24/23 Progress towards PT goals: Progressing toward goals    Frequency    Min 1X/week      PT Plan      Co-evaluation PT/OT/SLP Co-Evaluation/Treatment: Yes Reason for Co-Treatment: For patient/therapist safety;To address functional/ADL transfers;Complexity of the patient's impairments (multi-system involvement) PT goals addressed during session: Mobility/safety with mobility;Balance OT goals addressed during session: ADL's and self-care;Strengthening/ROM      AM-PAC PT "6 Clicks" Mobility   Outcome Measure  Help needed turning from your back to your side while in a flat bed without using bedrails?: A Lot Help needed moving from lying on your back to sitting on the side of a flat bed without using bedrails?: Total (+2 and bed rail) Help needed moving to and from a bed to a chair (  including a wheelchair)?: Total Help needed standing up from a chair using your arms (e.g., wheelchair or bedside chair)?: Total Help needed to walk in hospital room?: Total Help needed climbing 3-5 steps with a railing? : Total 6 Click Score: 7    End of Session Equipment Utilized During Treatment: Gait belt Activity Tolerance: Patient tolerated treatment well Patient left: in chair;with call bell/phone within reach;with chair alarm set;Other (comment) (geomat cushion under his hips, pt LUE elevated for edema mgmt) Nurse Communication: Mobility status;Need for lift equipment;Other (comment) (+2-3 lateral scoot from drop arm chair vs hoyer if lift pad ordered) PT Visit Diagnosis: Other  abnormalities of gait and mobility (R26.89);Other symptoms and signs involving the nervous system (R29.898);Hemiplegia and hemiparesis Hemiplegia - Right/Left: Left Hemiplegia - dominant/non-dominant: Non-dominant Hemiplegia - caused by: Cerebral infarction     Time: 1610-9604 PT Time Calculation (min) (ACUTE ONLY): 26 min  Charges:    $Therapeutic Activity: 8-22 mins PT General Charges $$ ACUTE PT VISIT: 1 Visit                     Rayann Jolley P., PTA Acute Rehabilitation Services Secure Chat Preferred 9a-5:30pm Office: [phone]    Dorathy Kinsman [facility] 09/16/2023, 2:46 PM

## 2023-09-16 NOTE — Progress Notes (Signed)
Progress Note    [name]   GUY:[id number]  DOB: Apr 27, 1981  DOA: 09/09/2023     7 PCP: [name], NP  Initial CC: endocarditis  Hospital Course: [name] is a 42 y.o. 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  While at [facility] he underwent cardiac cath showing non obstructive disease, craniotomy with abscess drainage on 09/01/23,. He had subarachnoid hemorrhage while on IV heparin, right third toe distal necrosis,was seen by podiatry, but family wanted conservative management.  Since his tertiary care needs have been completed, he was transferred back to [facility] for further management.  Therapy eval recommending SNF.  Interval History:  No events overnight. He became unfortunately upset over a diet change this morning and proceeded to use a multitude of swear words to all staff.  I still appreciate some inability on his part to fully understand why certain decisions are made for him and/or recommended for him. But unfortunately he maintains enough capacity for most dispo concerning questions.   Assessment and Plan:  MRSA MV /TV endocarditis Brain abscess s/p craniotomy and abscess drainage on 12/4 Complete 8 week treatment with IV vancomycin. He gets IV vancomycin with HD.  ID signed off.  EOT is 1/29. 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  He displayed better reasoning with his decisions regardless of what they were and at this time he is still at least amenable with entertaining SNF offers. - 12/19 no changes; same   ESRD on HD MWF Nephrology on board and appreciate recommendations  Right third toe necrosis Was seen by podiatry at [facility] recommended surgical intervention, but family opted for conservative management with IV antibiotics  Physical deconditioning - Due to prolonged hospitalization especially recently at [facility] - Continue working with therapy as able - see capacity discussion above; continue seeking SNF beds for now   Type 1 DM insulin dependent.  Pt non compliant to diet  - continue semglee - continue SSI and CBG monitoring    Gastroparesis  Stable.    Left  internal jugular DVT continue eliquis.    Subarachnoid hemorrhage while on Heparin.  Monitor.     Prostate fluid collection.  Dilated Seminal vesicle and no surgical intervention needed.  Recommend outpatient follow up with urology on discharge.    Anemia of chronic disease Hemoglobin around 8 and stable No obvious signs of bleeding     Estimated body mass index is 26.24 kg/m  as calculated from the following:   Height as of this encounter: 5\' 7"  (1.702 m).   Weight as of this encounter: 76 kg.   Old records reviewed in assessment of this patient   DVT prophylaxis:   apixaban (ELIQUIS) tablet 5 mg   Code Status:   Code Status: Full Code  Mobility Assessment (Last 72  Hours)     Mobility Assessment     Row Name 09/15/23 2027 09/15/23 0843 09/14/23 2017 09/14/23 1300 09/14/23 0930   Does patient have an order for bedrest or is patient medically unstable Yes- Bedfast (Level 1) - Complete Yes- Bedfast (Level 1) - Complete Yes- Bedfast (Level 1) - Complete -- Yes- Bedfast (Level 1) - Complete   What is the highest level of mobility based on the progressive mobility assessment? Level 1 (Bedfast) - Unable to balance while sitting on edge of bed Level 2 (Chairfast) - Balance while sitting on edge of bed and cannot stand Level 2 (Chairfast) - Balance while sitting on edge of bed and cannot stand Level 2 (Chairfast) - Balance while sitting on edge of bed and cannot stand Level 2 (Chairfast) - Balance while sitting on edge of bed and cannot stand   Is the above level different from baseline mobility prior to current illness? Yes - Recommend PT order Yes - Recommend PT order Yes - Recommend PT order -- Yes - Recommend PT order    Row Name 09/13/23 2200 09/13/23 1740         Does patient have an order for bedrest or is patient medically unstable No - Continue assessment --      What is the highest level of mobility based on the progressive mobility assessment? Level 2 (Chairfast) - Balance while sitting on edge of bed and cannot stand Level 2 (Chairfast) - Balance while sitting on edge of bed and cannot stand      Is the above level different from baseline mobility prior to current illness? Yes - Recommend PT order --               Barriers to discharge: none Disposition Plan:  U/K Status is: Inpt  Objective: Blood pressure (!) 151/46, pulse (!) 108, temperature 98 F (36.7 C), temperature source Oral, resp. rate 17, height 5\' 7"  (1.702 m), weight 76 kg, SpO2 100%.  Examination:  Physical Exam Constitutional:      General: He is not in acute distress.    Appearance: Normal appearance.     Comments: Disheveled appearance. Curses a lot  HENT:     Head:  Normocephalic and atraumatic.     Mouth/Throat:     Mouth: Mucous membranes are moist.  Eyes:     Extraocular Movements: Extraocular movements intact.  Cardiovascular:     Rate and Rhythm: Normal rate and regular rhythm.  Pulmonary:     Effort: Pulmonary effort is normal. No respiratory distress.     Breath sounds: Normal breath sounds. No wheezing.  Abdominal:     General: Bowel sounds are normal. There is no distension.     Palpations: Abdomen is soft.     Tenderness: There is no abdominal tenderness.  Musculoskeletal:     Cervical back: Normal range of motion and neck supple.     Comments: Left AKA noted  Skin:    General: Skin is warm and dry.  Neurological:     Mental Status: He is alert.     Comments: 0/5 strength in LUE  Psychiatric:  Attention and Perception: He is inattentive.        Mood and Affect: Mood is anxious.        Speech: Speech is rapid and pressured.        Behavior: Behavior is hyperactive.      Consultants:  Nephrology   Procedures:    Data Reviewed: Results for orders placed or performed during the hospital encounter of 09/09/23 (from the past 24 hours)  Glucose, capillary     Status: Abnormal   Collection Time: 09/15/23  5:13 PM  Result Value Ref Range   Glucose-Capillary 300 (H) 70 - 99 mg/dL   Comment 1 Notify RN   Glucose, capillary     Status: Abnormal   Collection Time: 09/15/23  8:20 PM  Result Value Ref Range   Glucose-Capillary 362 (H) 70 - 99 mg/dL  Glucose, capillary     Status: Abnormal   Collection Time: 09/15/23  8:46 PM  Result Value Ref Range   Glucose-Capillary 353 (H) 70 - 99 mg/dL  Glucose, capillary     Status: Abnormal   Collection Time: 09/16/23  7:47 AM  Result Value Ref Range   Glucose-Capillary 188 (H) 70 - 99 mg/dL  Glucose, capillary     Status: Abnormal   Collection Time: 09/16/23 10:35 AM  Result Value Ref Range   Glucose-Capillary 366 (H) 70 - 99 mg/dL  Renal function panel     Status: Abnormal    Collection Time: 09/16/23 11:21 AM  Result Value Ref Range   Sodium 132 (L) 135 - 145 mmol/L   Potassium 5.6 (H) 3.5 - 5.1 mmol/L   Chloride 96 (L) 98 - 111 mmol/L   CO2 24 22 - 32 mmol/L   Glucose, Bld 362 (H) 70 - 99 mg/dL   BUN 39 (H) 6 - 20 mg/dL   Creatinine, Ser 1.60 (H) 0.61 - 1.24 mg/dL   Calcium 9.7 8.9 - 10.9 mg/dL   Phosphorus 6.7 (H) 2.5 - 4.6 mg/dL   Albumin 2.7 (L) 3.5 - 5.0 g/dL   GFR, Estimated 15 (L) >60 mL/min   Anion gap 12 5 - 15  Glucose, capillary     Status: Abnormal   Collection Time: 09/16/23 12:28 PM  Result Value Ref Range   Glucose-Capillary 338 (H) 70 - 99 mg/dL    I have reviewed pertinent nursing notes, vitals, labs, and images as necessary. I have ordered labwork to follow up on as indicated.  I have reviewed the last notes from staff over past 24 hours. I have discussed patient's care plan and test results with nursing staff, CM/SW, and other staff as appropriate.    LOS: 7 days   Lewie Chamber, MD [facility] 09/16/2023, 1:42 PM

## 2023-09-16 NOTE — Progress Notes (Signed)
[facility] Progress Note  Subjective: seen in room. Cussing about diet change.   Vitals:   09/15/23 2050 09/16/23 0300 09/16/23 0331 09/16/23 0743  BP: (!) 104/32 111/89 111/89 (!) 151/46  Pulse:  (!) 105 (!) 105 (!) 108  Resp: 17 17 17 17   Temp: 99.1 F (37.3 C) 98.3 F (36.8 C) 98.3 F (36.8 C) 98 F (36.7 C)  TempSrc: Oral Oral Oral Oral  SpO2: 100% 100% 100% 100%  Weight:      Height:        Exam:  alert, nad   no jvd  Chest cta bilat  Cor reg no RG  Abd soft ntnd no ascites   LUE 1-2+ edema    Ext L AKA, no RLE edema   Alert, NF, ox3     L femoral TDC intact    Renal-related home meds: - eliquis 5 bid - rocaltrol 1.5 mcg mwf - gabapentin 100 tid - renagel 1 ac tid - metoprolol 25 bid      OP HD: [facility] MWF  4h   400/1.5  77kg  L fem TDC 2/3 bath  Heparin none   Assessment/ Plan: 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 Not a candidate for surgery here. SP line holiday w/ new TDC placed 10/18. Continues on IV vanc upon return from [place] . Adjusted plan per ID pharm is 750mg  MWF through 10/27/23.  Hyperkalemia - tried to explain to him why renal diet is needed, pt very angry and refusing to take the renal diet. Will add three times per week lokelma to regular diet and HD.  SP I&D brain abscess: done 12/04 at [facility]. Mental functioning is much better.  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 HD tomorrow, low K + bath .  Volume: on RA, L arm 1- 2+ edema, no other edema. 2- 3 L Uf w/ next HD  Anemia: Hb 9s stable. ESA dose increased to 100 mcg sq weekly. Will resume here. 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 Cont binders w/ meals.  Hypertension: BP's controlled, cont metoprolol 25 bid.  HD access:  L  femoral TDC placed on 10/18. 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 Dispo - could not dialyze well in the chair 12/18 today, pt required the  chair be in a supine position or he would have slid out of the chair.       Vinson Moselle MD  [facility] 09/16/2023, 4:34 PM  Recent Labs  Lab 09/13/23 0952 09/15/23 0705 09/16/23 1121  HGB 8.3* 9.0*  --   ALBUMIN 2.6* 2.7* 2.7*  CALCIUM 9.8 9.5 9.7  PHOS 6.6* 7.1* 6.7*  CREATININE 7.19* 6.23* 4.80*  K 5.5* 6.2* 5.6*   No results for input(s): "IRON", "TIBC", "FERRITIN" in the last 168 hours. Inpatient medications:  apixaban  5 mg Oral BID   calcitRIOL  1.5 mcg Oral Q M,W,F-HD   Chlorhexidine Gluconate Cloth  6 each Topical Daily   Chlorhexidine Gluconate Cloth  6 each Topical Q0600   Chlorhexidine Gluconate Cloth  6 each Topical Q0600   Chlorhexidine Gluconate Cloth  6 each Topical Q0600   [START ON 09/19/2023] darbepoetin (ARANESP) injection - DIALYSIS  100 mcg Subcutaneous Q Sun-1800   gabapentin  100 mg Oral TID   insulin aspart  0-9 Units Subcutaneous TID WC   insulin aspart  2 Units Subcutaneous TID WC   insulin  glargine-yfgn  6 Units Subcutaneous Daily   loratadine  10 mg Oral Daily   metoprolol tartrate  25 mg Oral BID   pantoprazole  40 mg Oral Daily   povidone-Iodine   Topical Q0600   QUEtiapine  12.5 mg Oral q morning   QUEtiapine  25 mg Oral QHS   senna-docusate  2 tablet Oral BID   sevelamer carbonate  800 mg Oral TID WC   sodium zirconium cyclosilicate  10 g Oral Q T,Th,Sat-1800    heparin sodium (porcine)     vancomycin 750 mg (09/15/23 1432)   acetaminophen, clonazepam, diphenhydrAMINE, diphenhydrAMINE-zinc acetate, heparin sodium (porcine), hydrALAZINE, loperamide, oxyCODONE

## 2023-09-16 NOTE — Progress Notes (Signed)
Occupational Therapy Treatment Patient Details Name: [name] MRN: [id number] DOB: [birth year] Today's Date: 09/16/2023   History of present illness 42 y.o. male presents to [facility] 09/09/23 as a transfer from [facility] for further management of endocarditis. Pt originally was admitted to [facility] 07/09/23 for DKA and sepsis after being found on the ground. 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 10/17 pt had a R MCA, L occipital, and B cerebellum CVA w/ septic emboli. ICU admit 10/18 w/ intubation 10/18-10/24. Pt was transferred to [facility] for MV repair on 11/29. At [facility], pt had cardiac cath w/ non obstructive disease, SAH, a craniotomy w/ abscess drainage on 12/4, and R third toe distal necrosis. PMH: chronic L AKA, MRSA, IDDM, ESRD on HD MWF, HTN, TEE positive for vegetation on multiple valves, cellulitis, medical noncompliance, narcotic dependence, DM with gastroparesis, L internal jugular DVT.   OT comments  Patient received in supine and agreeable to therapy. Patient instructed on bed mobility and using RUE to move LUE to assist with bed mobility. Patient with difficulty following command due to left neglect and required mod assist +2 to get to EOB. Patient performed stand from EOB to RW with Max assist +2 and assistance with LUE to maintain grip on RW. Patient performed squat pivot transfer to recliner with total assist of 2 using bed pad to assist with hips. Patient required several partial stands to address positioning in recliner. Patient will benefit from continued inpatient follow up therapy, <3 hours/day to increase independence and safety with bed mobility, transfers, and self care. Acute OT to continue to follow.       If plan is discharge home, recommend the following:  Two people to help with walking and/or transfers;A lot of help with bathing/dressing/bathroom;Assistance with cooking/housework;Assistance with feeding;Help with stairs or ramp for  entrance;Assist for transportation;Direct supervision/assist for financial management;Direct supervision/assist for medications management   Equipment Recommendations  Other (comment);Hoyer lift (hospital bed, [facility])    Recommendations for Other Services      Precautions / Restrictions Precautions Precautions: Fall Precaution Comments: LAKA, L hemiparesis, L neglect, bowel incontinence Restrictions Weight Bearing Restrictions Per Provider Order: No Other Position/Activity Restrictions: LLE prosthetic in his room       Mobility Bed Mobility Overal bed mobility: Needs Assistance Bed Mobility: Rolling, Sidelying to Sit Rolling: Mod assist, Used rails Sidelying to sit: Mod assist, +2 for physical assistance, HOB elevated       General bed mobility comments: instructions to move LUE to assist with bed mobility, patient had difficulty with this task due to neglect    Transfers Overall transfer level: Needs assistance Equipment used: 2 person hand held assist Transfers: Sit to/from Stand, Bed to chair/wheelchair/BSC Sit to Stand: Max assist, +2 physical assistance, Via lift equipment   Squat pivot transfers: Total assist, +2 physical assistance       General transfer comment: patient performed stand prior to transfer from EOB with max assist of 2 and cues for balance and posture. Squat pivot transfer to recliner with total assist +2     Balance Overall balance assessment: Needs assistance Sitting-balance support: Single extremity supported, Feet supported Sitting balance-[name]: Poor Sitting balance - Comments: CGA to min assist for sitting balance on EOB Postural control: Posterior lean Standing balance support: Bilateral upper extremity supported, During functional activity Standing balance-[name]: Poor Standing balance comment: stood from EOB with UE support and assistance for balance  ADL either performed or assessed with  clinical judgement   ADL Overall ADL's : Needs assistance/impaired Eating/Feeding: Set up;Sitting   Grooming: Set up;Wash/dry face                                 General ADL Comments: focused on bed mobility and transfers    Extremity/Trunk Assessment              Vision       Perception     Praxis      Cognition Arousal: Alert Behavior During Therapy: WFL for tasks assessed/performed Overall Cognitive Status: Impaired/Different from baseline Area of Impairment: Attention, Memory, Following commands, Safety/judgement, Awareness, Problem solving                   Current Attention Level: Focused Memory: Decreased short-term memory Following Commands: Follows one step commands inconsistently Safety/Judgement: Decreased awareness of safety, Decreased awareness of deficits Awareness: Intellectual Problem Solving: Slow processing, Decreased initiation, Difficulty sequencing, Requires verbal cues, Requires tactile cues General Comments: cooperative towards therapy, eager to progress to allow for going home        Exercises      Shoulder Instructions       General Comments      Pertinent Vitals/ Pain       Pain Assessment Pain Assessment: Faces Faces Pain Scale: No hurt  Home Living                                          Prior Functioning/Environment              Frequency  Min 1X/week        Progress Toward Goals  OT Goals(current goals can now be found in the care plan section)  Progress towards OT goals: Progressing toward goals  Acute Rehab OT Goals Patient Stated Goal: go home OT Goal Formulation: With patient Time For Goal Achievement: 09/24/23 Potential to Achieve Goals: Good ADL Goals Pt Will Perform Grooming: with set-up;sitting Pt Will Perform Lower Body Bathing: with mod assist;sit to/from stand Pt Will Perform Lower Body Dressing: sitting/lateral leans;with max assist Pt Will Transfer  to Toilet: with max assist;with transfer board;bedside commode Pt Will Perform Toileting - Clothing Manipulation and hygiene: with mod assist;sitting/lateral leans Pt/caregiver will Perform Home Exercise Program: Increased ROM;Left upper extremity Additional ADL Goal #1: pt will complete bed mobility rolling R and L min (A) Additional ADL Goal #2: pt will progress to eob sitting from [facility] 20 degrees with bed rail min (A) Additional ADL Goal #3: pt will complete basic transfer with sliding board total +2 mod (A)  Plan      Co-evaluation    PT/OT/SLP Co-Evaluation/Treatment: Yes Reason for Co-Treatment: For patient/therapist safety;To address functional/ADL transfers PT goals addressed during session: Mobility/safety with mobility;Balance OT goals addressed during session: ADL's and self-care;Strengthening/ROM      AM-PAC OT "6 Clicks" Daily Activity     Outcome Measure   Help from another person eating meals?: A Little (setup) Help from another person taking care of personal grooming?: A Lot Help from another person toileting, which includes using toliet, bedpan, or urinal?: Total Help from another person bathing (including washing, rinsing, drying)?: Total Help from another person to put on and taking off regular upper body clothing?: Total Help from another  person to put on and taking off regular lower body clothing?: Total 6 Click Score: 9    End of Session    OT Visit Diagnosis: Other abnormalities of gait and mobility (R26.89);Other symptoms and signs involving the nervous system (R29.898);Hemiplegia and hemiparesis;Muscle weakness (generalized) (M62.81);Other symptoms and signs involving cognitive function Hemiplegia - Right/Left: Left Hemiplegia - dominant/non-dominant: Non-Dominant Hemiplegia - caused by: Cerebral infarction   Activity Tolerance Patient tolerated treatment well   Patient Left in chair;with call bell/phone within reach;with chair alarm set   Nurse  Communication Mobility status;Other (comment) (recommended lateral scoot transfer back to bed)        Time: 9518-8416 OT Time Calculation (min): 26 min  Charges: OT General Charges $OT Visit: 1 Visit OT Treatments $Therapeutic Activity: 8-22 mins  Alfonse Flavors, OTA Acute Rehabilitation Services  Office [phone]   Dewain Penning 09/16/2023, 2:07 PM

## 2023-09-16 NOTE — Progress Notes (Signed)
Progress Note    [name]   EAV:[id number]  DOB: [birth year]  DOA: 09/09/2023     7 PCP: [name], NP  Initial CC: endocarditis  Hospital Course: [name] is a 42 y.o. 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  While at [facility] he underwent cardiac cath showing non obstructive disease, craniotomy with abscess drainage on 09/01/23,. He had subarachnoid hemorrhage while on IV heparin, right third toe distal necrosis,was seen by podiatry, but family wanted conservative management.  Since his tertiary care needs have been completed, he was transferred back to [facility] for further management.  Therapy eval recommending SNF.  Interval History:  No events overnight. He became unfortunately upset over a diet change this morning and proceeded to use a multitude of swear words to all staff.  I still appreciate some inability on his part to fully understand why certain decisions are made for him and/or recommended for him. But unfortunately he maintains enough capacity for most dispo concerning questions.   Assessment and Plan:  MRSA MV /TV endocarditis Brain abscess s/p craniotomy and abscess drainage on 12/4 Complete 8 week treatment with IV vancomycin. He gets IV vancomycin with HD.  ID signed off.  EOT is 1/29. 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  He displayed better reasoning with his decisions regardless of what they were and at this time he is still at least amenable with entertaining SNF offers. - 12/19 no changes; same  - worth getting a cognitive eval for further objective data as well  ESRD on HD MWF Nephrology on board and appreciate recommendations  Right third toe necrosis Was seen by podiatry at [facility] recommended surgical intervention, but family opted for conservative management with IV antibiotics  Physical deconditioning - Due to prolonged hospitalization especially recently at [facility] - Continue working with therapy as able - see capacity discussion above; continue seeking SNF beds for now   Type 1 DM insulin dependent.  Pt non compliant to diet  - continue semglee - continue SSI and CBG monitoring    Gastroparesis  Stable.    Left  internal jugular DVT continue eliquis.    Subarachnoid hemorrhage while on Heparin.  Monitor.     Prostate fluid collection.  Dilated Seminal vesicle and no surgical intervention needed.  Recommend outpatient follow up with urology on discharge.    Anemia of chronic disease Hemoglobin around 8 and stable No obvious signs of  bleeding     Estimated body mass index is 26.24 kg/m as calculated from the following:   Height as of this encounter: 5\' 7"  (1.702 m).   Weight as of this encounter: 76 kg.   Old records reviewed in assessment of this patient   DVT prophylaxis:   apixaban (ELIQUIS) tablet 5 mg    Code Status:   Code Status: Full Code  Mobility Assessment (Last 72 Hours)     Mobility Assessment     Row Name 09/15/23 2027 09/15/23 0843 09/14/23 2017 09/14/23 1300 09/14/23 0930   Does patient have an order for bedrest or is patient medically unstable Yes- Bedfast (Level 1) - Complete Yes- Bedfast (Level 1) - Complete Yes- Bedfast (Level 1) - Complete -- Yes- Bedfast (Level 1) - Complete   What is the highest level of mobility based on the progressive mobility assessment? Level 1 (Bedfast) - Unable to balance while sitting on edge of bed Level 2 (Chairfast) - Balance while sitting on edge of bed and cannot stand Level 2 (Chairfast) - Balance while sitting on edge of bed and cannot stand Level 2 (Chairfast) - Balance while sitting on edge of bed and cannot stand Level 2 (Chairfast) - Balance while sitting on edge of bed and cannot stand   Is the above level different from baseline mobility prior to current illness? Yes - Recommend PT order Yes - Recommend PT order Yes - Recommend PT order -- Yes - Recommend PT order    Row Name 09/13/23 2200 09/13/23 1740         Does patient have an order for bedrest or is patient medically unstable No - Continue assessment --      What is the highest level of mobility based on the progressive mobility assessment? Level 2 (Chairfast) - Balance while sitting on edge of bed and cannot stand Level 2 (Chairfast) - Balance while sitting on edge of bed and cannot stand      Is the above level different from baseline mobility prior to current illness? Yes - Recommend PT order --               Barriers to discharge: none Disposition Plan:  U/K Status is: Inpt  Objective: Blood pressure (!) 151/46, pulse (!) 108, temperature 98 F (36.7 C), temperature source Oral, resp. rate 17, height 5\' 7"  (1.702 m), weight 76 kg, SpO2 100%.  Examination:  Physical Exam Constitutional:      General: He is not in acute distress.    Appearance: Normal appearance.      Comments: Disheveled appearance. Curses a lot  HENT:     Head: Normocephalic and atraumatic.     Mouth/Throat:     Mouth: Mucous membranes are moist.  Eyes:     Extraocular Movements: Extraocular movements intact.  Cardiovascular:     Rate and Rhythm: Normal rate and regular rhythm.  Pulmonary:     Effort: Pulmonary effort is normal. No respiratory distress.     Breath sounds: Normal breath sounds. No wheezing.  Abdominal:     General: Bowel sounds are normal. There is no distension.     Palpations: Abdomen is soft.     Tenderness: There is no abdominal tenderness.  Musculoskeletal:     Cervical back: Normal range of motion and neck supple.     Comments: Left AKA noted  Skin:    General: Skin is warm and dry.  Neurological:     Mental Status: He is alert.  Comments: 0/5 strength in LUE  Psychiatric:        Attention and Perception: He is inattentive.        Mood and Affect: Mood is anxious.        Speech: Speech is rapid and pressured.        Behavior: Behavior is hyperactive.      Consultants:  Nephrology   Procedures:    Data Reviewed: Results for orders placed or performed during the hospital encounter of 09/09/23 (from the past 24 hours)  Glucose, capillary     Status: Abnormal   Collection Time: 09/15/23  5:13 PM  Result Value Ref Range   Glucose-Capillary 300 (H) 70 - 99 mg/dL   Comment 1 Notify RN   Glucose, capillary     Status: Abnormal   Collection Time: 09/15/23  8:20 PM  Result Value Ref Range   Glucose-Capillary 362 (H) 70 - 99 mg/dL  Glucose, capillary     Status: Abnormal   Collection Time: 09/15/23  8:46 PM  Result Value Ref Range   Glucose-Capillary 353 (H) 70 - 99 mg/dL  Glucose, capillary     Status: Abnormal   Collection Time: 09/16/23  7:47 AM  Result Value Ref Range   Glucose-Capillary 188 (H) 70 - 99 mg/dL  Glucose, capillary     Status: Abnormal   Collection Time: 09/16/23 10:35 AM  Result Value Ref Range   Glucose-Capillary  366 (H) 70 - 99 mg/dL  Renal function panel     Status: Abnormal   Collection Time: 09/16/23 11:21 AM  Result Value Ref Range   Sodium 132 (L) 135 - 145 mmol/L   Potassium 5.6 (H) 3.5 - 5.1 mmol/L   Chloride 96 (L) 98 - 111 mmol/L   CO2 24 22 - 32 mmol/L   Glucose, Bld 362 (H) 70 - 99 mg/dL   BUN 39 (H) 6 - 20 mg/dL   Creatinine, Ser 1.61 (H) 0.61 - 1.24 mg/dL   Calcium 9.7 8.9 - 09.6 mg/dL   Phosphorus 6.7 (H) 2.5 - 4.6 mg/dL   Albumin 2.7 (L) 3.5 - 5.0 g/dL   GFR, Estimated 15 (L) >60 mL/min   Anion gap 12 5 - 15  Glucose, capillary     Status: Abnormal   Collection Time: 09/16/23 12:28 PM  Result Value Ref Range   Glucose-Capillary 338 (H) 70 - 99 mg/dL    I have reviewed pertinent nursing notes, vitals, labs, and images as necessary. I have ordered labwork to follow up on as indicated.  I have reviewed the last notes from staff over past 24 hours. I have discussed patient's care plan and test results with nursing staff, CM/SW, and other staff as appropriate.    LOS: 7 days   Lewie Chamber, MD [facility] 09/16/2023, 1:53 PM

## 2023-09-17 DIAGNOSIS — I120 Hypertensive chronic kidney disease with stage 5 chronic kidney disease or end stage renal disease: Secondary | ICD-10-CM | POA: Diagnosis not present

## 2023-09-17 DIAGNOSIS — B9562 Methicillin resistant Staphylococcus aureus infection as the cause of diseases classified elsewhere: Secondary | ICD-10-CM | POA: Diagnosis not present

## 2023-09-17 DIAGNOSIS — R4189 Other symptoms and signs involving cognitive functions and awareness: Secondary | ICD-10-CM

## 2023-09-17 DIAGNOSIS — I079 Rheumatic tricuspid valve disease, unspecified: Secondary | ICD-10-CM | POA: Diagnosis not present

## 2023-09-17 DIAGNOSIS — N250 Renal osteodystrophy: Secondary | ICD-10-CM | POA: Diagnosis not present

## 2023-09-17 DIAGNOSIS — R7881 Bacteremia: Secondary | ICD-10-CM | POA: Diagnosis not present

## 2023-09-17 DIAGNOSIS — D631 Anemia in chronic kidney disease: Secondary | ICD-10-CM | POA: Diagnosis not present

## 2023-09-17 DIAGNOSIS — I38 Endocarditis, valve unspecified: Secondary | ICD-10-CM | POA: Diagnosis not present

## 2023-09-17 DIAGNOSIS — Z992 Dependence on renal dialysis: Secondary | ICD-10-CM | POA: Diagnosis not present

## 2023-09-17 DIAGNOSIS — I330 Acute and subacute infective endocarditis: Secondary | ICD-10-CM | POA: Diagnosis not present

## 2023-09-17 DIAGNOSIS — N186 End stage renal disease: Secondary | ICD-10-CM | POA: Diagnosis not present

## 2023-09-17 LAB — GLUCOSE, CAPILLARY
Glucose-Capillary: 101 mg/dL — ABNORMAL HIGH (ref 70–99)
Glucose-Capillary: 111 mg/dL — ABNORMAL HIGH (ref 70–99)
Glucose-Capillary: 120 mg/dL — ABNORMAL HIGH (ref 70–99)
Glucose-Capillary: 132 mg/dL — ABNORMAL HIGH (ref 70–99)
Glucose-Capillary: 154 mg/dL — ABNORMAL HIGH (ref 70–99)
Glucose-Capillary: 175 mg/dL — ABNORMAL HIGH (ref 70–99)
Glucose-Capillary: 190 mg/dL — ABNORMAL HIGH (ref 70–99)
Glucose-Capillary: 39 mg/dL — CL (ref 70–99)
Glucose-Capillary: 450 mg/dL — ABNORMAL HIGH (ref 70–99)
Glucose-Capillary: 50 mg/dL — ABNORMAL LOW (ref 70–99)
Glucose-Capillary: 60 mg/dL — ABNORMAL LOW (ref 70–99)
Glucose-Capillary: 72 mg/dL (ref 70–99)
Glucose-Capillary: 78 mg/dL (ref 70–99)
Glucose-Capillary: 80 mg/dL (ref 70–99)
Glucose-Capillary: 84 mg/dL (ref 70–99)
Glucose-Capillary: 93 mg/dL (ref 70–99)

## 2023-09-17 LAB — CBC WITH DIFFERENTIAL/PLATELET
Abs Immature Granulocytes: 0.02 10*3/uL (ref 0.00–0.07)
Basophils Absolute: 0 10*3/uL (ref 0.0–0.1)
Basophils Relative: 1 %
Eosinophils Absolute: 0.3 10*3/uL (ref 0.0–0.5)
Eosinophils Relative: 6 %
HCT: 31.3 % — ABNORMAL LOW (ref 39.0–52.0)
Hemoglobin: 9.8 g/dL — ABNORMAL LOW (ref 13.0–17.0)
Immature Granulocytes: 0 %
Lymphocytes Relative: 18 %
Lymphs Abs: 1.1 10*3/uL (ref 0.7–4.0)
MCH: 29.1 pg (ref 26.0–34.0)
MCHC: 31.3 g/dL (ref 30.0–36.0)
MCV: 92.9 fL (ref 80.0–100.0)
Monocytes Absolute: 0.4 10*3/uL (ref 0.1–1.0)
Monocytes Relative: 7 %
Neutro Abs: 4 10*3/uL (ref 1.7–7.7)
Neutrophils Relative %: 68 %
Platelets: 196 10*3/uL (ref 150–400)
RBC: 3.37 MIL/uL — ABNORMAL LOW (ref 4.22–5.81)
RDW: 15.2 % (ref 11.5–15.5)
WBC: 5.9 10*3/uL (ref 4.0–10.5)
nRBC: 0 % (ref 0.0–0.2)

## 2023-09-17 LAB — RENAL FUNCTION PANEL
Albumin: 2.8 g/dL — ABNORMAL LOW (ref 3.5–5.0)
Anion gap: 13 (ref 5–15)
BUN: 50 mg/dL — ABNORMAL HIGH (ref 6–20)
CO2: 25 mmol/L (ref 22–32)
Calcium: 9.7 mg/dL (ref 8.9–10.3)
Chloride: 95 mmol/L — ABNORMAL LOW (ref 98–111)
Creatinine, Ser: 5.56 mg/dL — ABNORMAL HIGH (ref 0.61–1.24)
GFR, Estimated: 12 mL/min — ABNORMAL LOW (ref >60)
Glucose, Bld: 115 mg/dL — ABNORMAL HIGH (ref 70–99)
Phosphorus: 7.3 mg/dL — ABNORMAL HIGH (ref 2.5–4.6)
Potassium: 5.5 mmol/L — ABNORMAL HIGH (ref 3.5–5.1)
Sodium: 133 mmol/L — ABNORMAL LOW (ref 135–145)

## 2023-09-17 LAB — MAGNESIUM: Magnesium: 2.1 mg/dL (ref 1.7–2.4)

## 2023-09-17 MED ORDER — DEXTROSE 10 % IV SOLN: INTRAVENOUS | Status: AC

## 2023-09-17 MED ORDER — HEPARIN SODIUM (PORCINE) 1000 UNIT/ML IJ SOLN
INTRAMUSCULAR | Status: AC
  Filled 2023-09-17: qty 5

## 2023-09-17 MED ORDER — ALTEPLASE 2 MG IJ SOLR: 2.0000 mg | Freq: Once | INTRAMUSCULAR | Status: DC | PRN

## 2023-09-17 MED ORDER — HEPARIN SODIUM (PORCINE) 1000 UNIT/ML DIALYSIS: 1000.0000 [IU] | INTRAMUSCULAR | Status: DC | PRN

## 2023-09-17 MED ORDER — LIDOCAINE-PRILOCAINE 2.5-2.5 % EX CREA: 1.0000 | TOPICAL_CREAM | CUTANEOUS | Status: DC | PRN

## 2023-09-17 MED ORDER — DEXTROSE 50 % IV SOLN
25.0000 g | INTRAVENOUS | Status: AC
  Administered 2023-09-17: 25 g via INTRAVENOUS
  Filled 2023-09-17: qty 50

## 2023-09-17 MED ORDER — PENTAFLUOROPROP-TETRAFLUOROETH EX AERO: 1.0000 | INHALATION_SPRAY | CUTANEOUS | Status: DC | PRN

## 2023-09-17 MED ORDER — NEPRO/CARBSTEADY PO LIQD: 237.0000 mL | ORAL | Status: DC | PRN

## 2023-09-17 MED ORDER — ANTICOAGULANT SODIUM CITRATE 4% (200MG/5ML) IV SOLN: 5.0000 mL | Status: DC | PRN

## 2023-09-17 MED ORDER — LIDOCAINE HCL (PF) 1 % IJ SOLN: 5.0000 mL | INTRAMUSCULAR | Status: DC | PRN

## 2023-09-17 NOTE — TOC Progression Note (Signed)
Transition of Care [facility]) - Progression Note    Patient Details  Name: [name] MRN: [id number] Date of Birth: 24-May-1981  Transition of Care [facility]) CM/SW Contact  Carley Hammed, LCSW Phone Number: 09/17/2023, 12:30 PM  Clinical Narrative:    TOC continues to follow pt and assist with disposition as pt progresses. Current barrier is pt being unable to sit for HD, pt declined HD this AM, switched to second shift. CSW following for possible SNF placement and aware of additional barriers including transport needs. TOC continues to follow for DC needs.         Expected Discharge Plan and Services                                               Social Determinants of Health (SDOH) Interventions SDOH Screenings   Food Insecurity: No Food Insecurity (09/09/2023)  Housing: Unknown (09/09/2023)  Transportation Needs: No Transportation Needs (09/09/2023)  Utilities: Not At Risk (09/09/2023)  Alcohol Screen: Low Risk  (02/15/2023)  Depression (PHQ2-9): Low Risk  (02/15/2023)  Financial Resource Strain: Low Risk  (02/15/2023)  Physical Activity: Inactive (02/15/2023)  Social Connections: Socially Integrated (02/15/2023)  Stress: No Stress Concern Present (02/15/2023)  Tobacco Use: Low Risk  (09/12/2023)  Recent Concern: Tobacco Use - Medium Risk (09/01/2023)   Received from [facility]    Readmission Risk Interventions     No data to display

## 2023-09-17 NOTE — Progress Notes (Signed)
    Patient Name: [name]           DOB: 1980-12-10  MRN: [id number]      Admission Date: 09/09/2023  Attending Provider: Lewie Chamber, MD  Primary Diagnosis: Endocarditis   Level of care: Med-Surg    CROSS COVER NOTE   Date of Service   09/17/2023   [name], 42 y.o. male, was admitted on 09/09/2023 for Endocarditis.    HPI/Events of Note   Multiple Hypoglycemic episodes, asymptomatic Patient refusing oral intake. RN has been using D50 to treat hypoglycemia.  STAT serum glucose level ordered to verify, but has not been collected yet.   5621- RN reports CBG 39. Will give D50 and start d10 gtt given recurrent episodes and pts refusal for oral intake.     Interventions/ Plan   D50 D10 gtt Check CBG Q1x4 hrs         Anthoney Harada, DNP, [employer] [facility] [place]

## 2023-09-17 NOTE — Procedures (Signed)
I have reviewed the HD regimen and made appropriate changes.  Vinson Moselle MD  [facility] 09/17/2023, 3:33 PM    Assessment/ Plan: 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 Not a candidate for surgery here. SP line holiday w/ new TDC placed 10/18. Continues on IV vanc upon return from [place] . Adjusted plan per ID pharm is 750mg  MWF through 10/27/23.  Hyperkalemia - tried to explain to him why renal diet is needed, pt very angry and refusing to take the renal diet. Will add three times per week lokelma to regular diet.  SP I&D brain abscess: done 12/04 at [facility]. Mental functioning is much better.  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 HD today.  Volume: on RA, L arm 1- 2+ edema, no other edema. 2- 3 L Uf w/ next HD  Anemia: Hb 9s stable. ESA dose increased to 100 mcg sq weekly. Will resume here. 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 Cont binders w/ meals.  Hypertension: BP's controlled, cont metoprolol 25 bid.  HD access:  L  femoral TDC placed on 10/18. 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 Dispo - could not dialyze well in the chair 12/18 today, pt required the chair be in a supine position or he would have slid out of the chair.                             I have reviewed the HD regimen and made appropriate changes.  Vinson Moselle MD  [facility] 09/17/2023, 3:31 PM

## 2023-09-17 NOTE — Progress Notes (Signed)
Pt request for RN to take his Blood Sugar and it was 72, and pt refused juice and sandwich but would let us give him a Rice Krispies Treat to eat

## 2023-09-17 NOTE — Progress Notes (Signed)
Physical Therapy Treatment Patient Details Name: [name] MRN: [id number] DOB: [birth year] Today's Date: 09/17/2023   History of Present Illness 42 y.o. male presents to [facility] 09/09/23 as a transfer from [facility] for further management of endocarditis. Pt originally was admitted to [facility] 07/09/23 for DKA and sepsis after being found on the ground. 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 10/17 pt had a R MCA, L occipital, and B cerebellum CVA w/ septic emboli. ICU admit 10/18 w/ intubation 10/18-10/24. Pt was transferred to [facility] for MV repair on 11/29. At [facility], pt had cardiac cath w/ non obstructive disease, SAH, a craniotomy w/ abscess drainage on 12/4, and R third toe distal necrosis. PMH: chronic L AKA, MRSA, IDDM, ESRD on HD MWF, HTN, TEE positive for vegetation on multiple valves, cellulitis, medical noncompliance, narcotic dependence, DM with gastroparesis, L internal jugular DVT.    PT Comments  Pt received in supine, agreeable to therapy session after pt awoken, pt pleasantly cooperative and expressed motivation to work on supine strengthening exercises, pt leaving soon for HD apt and pt/staff defer OOB to chair. PTA called HD unit and offered to bring HD chair to his room to allow for pt to have HD in chair as this is part of plan of care, however HD staff reports there is not time for this prior to transport. No transport staff arrived to room during PT session. Pt with good effort for supine RLE AROM exercises and LUE/LLE AAROM. Pt given multimodal cues and instruction on using his RUE to attend to L hemibody and for AA prior to pt mobilizing himself for rolling/repositioning and for elevating LUE for edema mgmt. Pt with good participation during session and calm demeanor, RN notified he will need LUE sling prior to next session for limb protection during scoot/pivot transfers. Pt will continue to benefit from skilled rehab in a post acute setting to  maximize functional gains before returning home.    If plan is discharge home, recommend the following: Two people to help with walking and/or transfers;Assist for transportation;Supervision due to cognitive status;Help with stairs or ramp for entrance;Two people to help with bathing/dressing/bathroom;Assistance with cooking/housework;Direct supervision/assist for financial management;Direct supervision/assist for medications management   Can travel by private vehicle     No  Equipment Recommendations  Hoyer lift;Wheelchair (measurements PT);Wheelchair cushion (measurements PT) (Roho cushion due to need for prolonged chair for HD apts; slide board likely pending progress)    Recommendations for Other Services Rehab consult     Precautions / Restrictions Precautions Precautions: Fall Precaution Comments: Contact precs; L AKA, L hemiparesis, L neglect, bowel incontinence Restrictions Weight Bearing Restrictions Per Provider Order: No LLE Weight Bearing Per Provider Order: Weight bearing as tolerated Other Position/Activity Restrictions: LLE prosthetic in his room     Mobility  Bed Mobility Overal bed mobility: Needs Assistance Bed Mobility: Rolling Rolling: Min assist, Mod assist, Used rails         General bed mobility comments: minA to roll to his L side, modA to roll to his R side due to L hemibody weakness and difficulty sequencing. Dirty/food smeared bed pad under his hips changed during session while pt rolling and new bed pad placed.  Pt needing hand over hand assist to attend to his LUE prior to/during roll. Also worked on bridging his hips up for moving to L/R sides of bed and better core activation in supine.    Transfers Overall transfer level: Needs assistance  General transfer comment: defer transfer OOB to recliner as per call to HD unit, pt about to be transported to HD in chair and they are not going ot have time to arrange for HD recliner to  be brought to his room. PTA offered to come to HD dept to pick up a HD chair, but RN said transporter was already on their way. Transport did not arrive during PT session (which was ~30 mins long)    Ambulation/Gait               General Gait Details: Pt unable   Stairs             Wheelchair Mobility     Tilt Bed    Modified Rankin (Stroke Patients Only)       Balance Overall balance assessment: Needs assistance Sitting-balance support: Single extremity supported, Feet supported Sitting balance-[name]: Fair Sitting balance - Comments: defer today as pt leaving soon for HD per HD dept member when PTA called       Standing balance comment: defer for pt safety, emphasis on bed-level exercises and repositioning                            Cognition Arousal: Alert Behavior During Therapy: WFL for tasks assessed/performed Overall Cognitive Status: Impaired/Different from baseline Area of Impairment: Attention, Memory, Following commands, Safety/judgement, Awareness, Problem solving                   Current Attention Level: Focused Memory: Decreased short-term memory Following Commands: Follows one step commands with increased time Safety/Judgement: Decreased awareness of safety, Decreased awareness of deficits Awareness: Intellectual Problem Solving: Slow processing, Difficulty sequencing, Requires verbal cues, Requires tactile cues General Comments: Pt sleeping but when awoken, calm and cooperative, agreeable to therapy session. Pt does not appear to recall his demeanor with other PT/staff on previous day when he awoke in chair and was apparently agitated. This date, he was cooperative and PTA reinforced benefits of OOB to chair on HD days and non-HD to work toward tolerating 4 hours OOB in the chair. Pt agreeable however PTA called HD dept and offered to bring HD recliner to his room prior to transport, but HD RN reports transport has  already been sent. Transporter did not arrive to room during 30 min session, however.        Exercises Other Exercises Other Exercises: supine single leg bridges with RLE activation and cues to keep RUE at his side to assist, x5 reps x 2 sets Other Exercises: supine LLE AA/PROM with pt cued to assist using RUE for hip flexion and hip abduction, PTA also assisting x10 reps ea Other Exercises: supine RLE AROM: LAQ (PTA holding hip stabilized in flexion to allow for LAQ movement), hip ADduction pillow squeezes, heel slides, hip ABduction x10 reps ea Other Exercises: worked on pt using RUE for [employer] for LUE positioning/ROM, shoulder flexion and ADduction with AA from PTA as well to ensure proper technique and support x5-10 reps ea direction    General Comments General comments (skin integrity, edema, etc.): Pt LUE remains very swollen (fingers proximal to shoulder), pt with pillow under limb upon arrival but not very elevated, PTA reviews positioning with pt for LUE edema mgmt and reinforced with him of L HB attention and to notify staff if he doesn't have LUE elevated so they can help him with this. L hand/wrist elevated above elbow and elbow above  heart at end of session to ensure better edema mgmt. RN notified LUE sling that was ordered previous date was not observed in his room, he will still need this delivered for next session so we can work more on safe transfers OOB and pt still with L neglect and flaccidity.      Pertinent Vitals/Pain Pain Assessment Pain Assessment: No/denies pain Faces Pain Scale: No hurt Pain Intervention(s): Monitored during session, Repositioned    Home Living                          Prior Function            PT Goals (current goals can now be found in the care plan section) Acute Rehab PT Goals Patient Stated Goal: to get more therapy and gain independence PT Goal Formulation: With patient Time For Goal Achievement: 09/24/23 Progress towards PT  goals: Progressing toward goals    Frequency    Min 1X/week      PT Plan      Co-evaluation              AM-PAC PT "6 Clicks" Mobility   Outcome Measure  Help needed turning from your back to your side while in a flat bed without using bedrails?: A Lot Help needed moving from lying on your back to sitting on the side of a flat bed without using bedrails?: Total Help needed moving to and from a bed to a chair (including a wheelchair)?: Total Help needed standing up from a chair using your arms (e.g., wheelchair or bedside chair)?: Total Help needed to walk in hospital room?: Total Help needed climbing 3-5 steps with a railing? : Total 6 Click Score: 7    End of Session Equipment Utilized During Treatment: Other (comment) (briefs donned during session as per pt/staff he is leaving soon for HD and may not be able to get on bed pan easily in HD given access site in his L groin) Activity Tolerance: Patient tolerated treatment well Patient left: with call bell/phone within reach;in bed;with bed alarm set (slightly sidelying toward his R for pressure relief, LUE elevated for edema mgmt, briefs donned) Nurse Communication: Mobility status;Need for lift equipment;Other (comment) (pt needs LUE sling for next session for transfer safety) PT Visit Diagnosis: Other abnormalities of gait and mobility (R26.89);Other symptoms and signs involving the nervous system (R29.898);Hemiplegia and hemiparesis Hemiplegia - Right/Left: Left Hemiplegia - dominant/non-dominant: Non-dominant Hemiplegia - caused by: Cerebral infarction     Time: 4098-1191 PT Time Calculation (min) (ACUTE ONLY): 33 min  Charges:    $Therapeutic Exercise: 8-22 mins $Neuromuscular Re-education: 8-22 mins PT General Charges $$ ACUTE PT VISIT: 1 Visit                     Noorah Giammona P., PTA Acute Rehabilitation Services Secure Chat Preferred 9a-5:30pm Office: [phone]    Dorathy Kinsman [facility] 09/17/2023, 4:45 PM

## 2023-09-17 NOTE — Progress Notes (Addendum)
Patient's bedtime CBG was 55 @ 2243, 12.5g IV dextrose administered. Pt refused PO and insisted on dextrose IV stating 'its the only thing that brings up my sugar'.   CBG @ 2350 was 54: 25g IV dextrose administered.  CBG @ 0043: 84, repeat CBG @ 0045 was 78. CBG @ 0153: 50, 25g IV dextrose given. On call A. Virgel Manifold, NP informed. CBG @ 0301: 80 CBG @ 0513: 39. 25g IV dextrose given. D10 @50ml /hr ordered and initiated.  CBG @0556 : 154. Q1 hr CBG's ordered.   Pt wanted this RN to call his mom and inform her of low blood sugars prior to giving most recent dextrose. Informed pt it was more important to administer dextrose first and then call his mom. Pt called mother while dextrose IVP was given and D10 infusion initiation stating that 'we are doing something to him to make his sugars low'. This RN spoke with mother, Wonda Amis, and let her know his blood sugars and dextrose interventions. This RN did note a personal glucometer in room with a vial of 70/30 insulin and pen. These supplies were out of the patients reach on the shelf  under the TV. This RN asked mom if anyone had administered this insulin to pt prior to her leaving yesterday. Pt & mom denied use of 70/30 insulin. Pt unable to get up on his own. Pt very anxious but mom calmed him down.  Cbg @0700 : 111. Spoke with dialysis and pt can go to HD on D10 IVF. Pt is refusing am dialysis because he wants to eat the food her ordered and not a breakfast sandwich in HD. Dialysis notified and pt is moved to second shift.

## 2023-09-17 NOTE — Evaluation (Addendum)
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) 07/27/2023   Hemiparesis affecting left side as late effect of stroke ([name]) 07/27/2023   Dysphagia due to recent stroke 07/27/2023   Cardiogenic shock ([name]) 07/27/2023   Acute blood loss anemia 07/27/2023   Hx of AKA (above knee amputation), left ([name]) 07/27/2023   Subclavian vein occlusion, bilateral ([name]) 07/27/2023   Fatty liver 07/27/2023   Liver mass 07/27/2023   Physical deconditioning 07/27/2023   Protein-calorie malnutrition, severe 07/22/2023   Infective endocarditis of tricuspid valve 07/15/2023   Acute systolic CHF (congestive heart failure) ([name]) 07/14/2023   DCM (dilated cardiomyopathy) ([name]) 07/14/2023   Coronary artery calcification seen on CAT scan 07/14/2023   Endocarditis of mitral valve 07/13/2023   ESRD on hemodialysis ([name]) 07/13/2023   MRSA bacteremia 07/12/2023   Right elbow pain 07/12/2023   Acute metabolic encephalopathy 07/10/2023   Diabetic foot infection ([name]) 04/09/2023   Elevated LFTs 04/09/2023   Thrombocytopenia ([name]) 04/09/2023   Cellulitis of right leg 05/19/2021   Pressure injury of skin 10/15/2020   Adjustment disorder, unspecified 09/21/2020   Malnutrition of moderate degree 09/13/2020   Charcot's joint of foot, left    Pneumonia due to COVID-19 virus 02/14/2020   Allergy, unspecified, initial encounter 07/17/2019   Diarrhea 04/24/2019   Encounter for orthopedic aftercare  following surgical amputation    DM (diabetes mellitus), secondary, uncontrolled, with neurologic complications    Headache 09/23/2018   Anemia of chronic disease 08/18/2018   Unspecified protein-calorie malnutrition ([name]) 02/14/2018   Insulin dependent type 1 diabetes mellitus ([name]) 10/25/2017   Diabetic gastroparesis ([name]) 10/25/2017   Septic shock ([name]) 10/25/2017   Iron deficiency anemia, unspecified 10/28/2016   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   Secondary hyperparathyroidism of renal origin ([name]) 10/28/2016   HTN (hypertension) 08/09/2014   ESRD on dialysis ([name]) 03/19/2014   Past Medical History:  Past Medical History:  Diagnosis Date   Anemia    Cellulitis and abscess of toe of left foot    Diabetes mellitus without complication ([name])    Diabetic gastroparesis ([name])    Diabetic ulcer of left midfoot associated with diabetes mellitus due to underlying condition, with necrosis of bone ([name])    Dialysis patient ([name])    ESRD (end stage renal disease) ([name])    Hypertension    Sepsis ([name])    Past Surgical History:  Past Surgical History:  Procedure Laterality Date   AMPUTATION Right 02/02/2018   Procedure: RIGHT FIFTH TOE AND METATARSAL AMPUTATION. Filetted toe flap metatarsal resection. Debridement Plantar Foot wound;  Surgeon: Park Liter, DPM;  Location[facility] OR;  Service: Podiatry;  Laterality: Right;   AMPUTATION Left 08/20/2018   Procedure: FIFTH METATARSAL BONE BIOPSY;  Surgeon: Park Liter, DPM;  Location: [facility] OR;  Service: Podiatry;  Laterality: Left;   AMPUTATION Left 10/28/2018   Procedure: LEFT GREAT TOE AMPUTATION;  Surgeon: Park Liter, DPM;  Location: [facility] OR;  Service: Podiatry;  Laterality: Left;   AMPUTATION Left 09/16/2020   Procedure:  AMPUTATION BELOW KNEE;  Surgeon: Larina Earthly, MD;  Location: [facility] OR;  Service: Vascular;  Laterality: Left;   AMPUTATION Left 10/15/2020   Procedure: AMPUTATION ABOVE KNEE;  Surgeon: Sherren Kerns, MD;  Location: [facility] OR;  Service: Vascular;  Laterality: Left;   APPLICATION OF WOUND VAC  02/02/2018   Procedure: APPLICATION OF WOUND VAC  Right Foot;  Surgeon: Park Liter, DPM;  Location: [facility] OR;  Service: Podiatry;;   APPLICATION OF WOUND VAC Left 10/28/2018   Procedure: APPLICATION OF WOUND VAC LEFT TOE;  Surgeon: Park Liter, DPM;  Location: [facility] OR;  Service: Podiatry;  Laterality: Left;   APPLICATION OF WOUND VAC Left 11/01/2018   Procedure: APPLICATION OF WOUND VAC;  Surgeon: Park Liter, DPM;  Location: [facility] OR;  Service: Podiatry;  Laterality: Left;   AV FISTULA PLACEMENT     left arm.   AV FISTULA PLACEMENT Right 12/22/2016   Procedure: INSERTION OF ARTERIOVENOUS (AV) GORE-TEX GRAFT ARM;  Surgeon: Sherren Kerns, MD;  Location: [facility] OR;  Service: Vascular;  Laterality: Right;   AV FISTULA PLACEMENT Left 05/26/2018   Procedure: INSERTION OF  ARTERIOVENOUS (AV) GORE-TEX GRAFT LEFT ARM;  Surgeon: Nada Libman, MD;  Location: [facility] OR;  Service: Vascular;  Laterality: Left;   EYE SURGERY     I & D EXTREMITY Right 10/31/2017   Procedure: IRRIGATION AND DEBRIDEMENT RIGHT FOOT;  Surgeon: Park Liter, DPM;  Location: [facility] OR;  Service: Podiatry;  Laterality: Right;   I & D EXTREMITY Left 08/20/2018   Procedure: IRRIGATION AND DEBRIDEMENT EXTREMITY WITH SECONDARY WOUND CLOSUREAND APPLICATION OF WOUND VAC LEFT FOOT;  Surgeon: Park Liter, DPM;  Location: [facility] OR;  Service: Podiatry;  Laterality: Left;   I & D EXTREMITY Left 10/20/2018   Procedure: IRRIGATION AND DEBRIDEMENT LEFT FOOT  DEBRIDEMENT LATERAL FOOT WOUND;  Surgeon: Park Liter, DPM;  Location: [facility] OR;  Service: Podiatry;  Laterality: Left;   I & D EXTREMITY Left 10/28/2018   Procedure: IRRIGATION AND DEBRIDEMENT LEFT TOE;  Surgeon: Park Liter, DPM;  Location: [facility] OR;  Service: Podiatry;  Laterality: Left;   I & D EXTREMITY Left 09/14/2020   Procedure: IRRIGATION AND DEBRIDEMENT WRIST;  Surgeon: Betha Loa, MD;  Location: [facility] OR;  Service: Orthopedics;  Laterality: Left;   INSERTION OF DIALYSIS CATHETER     Right subclavian   IR AV DIALY SHUNT INTRO NEEDLE/INTRACATH INITIAL W/PTA/IMG RIGHT Right 02/05/2018   IR FLUORO GUIDE CV LINE LEFT  04/14/2023   IR FLUORO GUIDE CV LINE LEFT  07/16/2023   IR FLUORO GUIDE CV LINE RIGHT  01/31/2020   IR FLUORO GUIDE CV LINE RIGHT  01/30/2022   IR PTA VENOUS EXCEPT DIALYSIS CIRCUIT  01/30/2022   IR REMOVAL TUN CV CATH W/O FL  04/12/2023   IR THROMBECTOMY AV FISTULA W/THROMBOLYSIS/PTA INC/SHUNT/IMG LEFT Left 08/24/2018   IR THROMBECTOMY AV FISTULA W/THROMBOLYSIS/PTA INC/SHUNT/IMG LEFT Left 01/06/2019   IR US GUIDE VASC ACCESS LEFT  08/24/2018   IR US GUIDE VASC ACCESS LEFT  04/14/2023   IR US GUIDE VASC ACCESS LEFT  07/16/2023   IR US GUIDE VASC ACCESS LEFT  07/16/2023   IR US GUIDE VASC ACCESS RIGHT  02/05/2018   IR US GUIDE VASC ACCESS RIGHT  01/31/2020   IR US GUIDE VASC ACCESS RIGHT  07/16/2023   IR VENO/JUGULAR LEFT  07/16/2023   IR VENO/JUGULAR RIGHT  07/16/2023   IR VENOCAVAGRAM SVC  01/30/2022  IRRIGATION AND DEBRIDEMENT FOOT Right 10/23/2018   Procedure: Irrigation and Debridement to tendon, Left Foot;  Surgeon: Park Liter, DPM;  Location: [facility] OR;  Service: Podiatry;  Laterality: Right;   IRRIGATION AND DEBRIDEMENT FOOT Left 11/01/2018   Procedure: IRRIGATION AND DEBRIDEMENT PARTIAL WOUND CLOSURE LOCAL TISSUE TRANSFER AND FLAP ROTATION, LEFT FOOT;  Surgeon: Park Liter, DPM;  Location: [facility] OR;  Service: Podiatry;  Laterality: Left;   TRANSESOPHAGEAL ECHOCARDIOGRAM (CATH LAB) N/A 07/14/2023   Procedure: TRANSESOPHAGEAL ECHOCARDIOGRAM;  Surgeon: Thomasene Ripple, DO;  Location: [facility] INVASIVE CV LAB;  Service: Cardiovascular;  Laterality: N/A;   TRANSMETATARSAL AMPUTATION N/A 08/18/2018   Procedure: IRRIGATION AND DEBRIDEMENT OF LEFT 5TH TOE AND TRANSMETATARSAL, WITH PARTICAL LEFT 5TH TOE AND METATARSAL AMPUTATION, BONE BIOPSY, WOUND VAC APPLICATION.;   Surgeon: Park Liter, DPM;  Location: [facility] OR;  Service: Podiatry;  Laterality: N/A;   UPPER EXTREMITY VENOGRAPHY N/A 11/16/2016   Procedure: Upper Extremity Venography - Right Central;  Surgeon: Sherren Kerns, MD;  Location: [facility] INVASIVE CV LAB;  Service: Cardiovascular;  Laterality: N/A;   UPPER EXTREMITY VENOGRAPHY N/A 05/25/2018   Procedure: UPPER EXTREMITY VENOGRAPHY - Bilateral;  Surgeon: Cephus Shelling, MD;  Location: [facility] INVASIVE CV LAB;  Service: Cardiovascular;  Laterality: N/A;   HPI:  [name] is a 42 y.o. 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 for MV repair on 11/29.  While at [facility] he underwent cardiac cath showing non obstructive disease, craniotomy with abscess drainage on 09/01/23,. He had subarachnoid hemorrhage while on IV heparin, right third toe distal necrosis,was seen by podiatry, but family wanted conservative management.  Since his tertiary care needs have been completed, he was transferred back to [facility] for further management.   Assessment / Plan / Recommendation Clinical Impression  Pt seen for cognitive assessment following transfer to [facility] for MV repain.  Pt with brain abscess and SAH during that admission.  Pt transfered back to [facility] for further care.  Pt presents with mild cognitive deficits.  Pt was assessed using the COGNISTAT (see below for additional information). He was very agitated on arrival with staff in room, but was redirectable and participated very well throughout today's assessment. Pt exhbited severe deficits with word recall task and moderate impairment with  calculations.  Pt was within 1 of correct answer for missed items.  He grew frustrated and declined to solve further math problems.  With orientation questions, pt initially stated that the year was 2004, but corrected when the question was repeated. He demonstrated ability to self correct throughout assessment.  He also stated his age incorrectly as 75 instead of 41. He exhibited some impulsivity initially beginning to repeat numbers for digit span task before SLP had finished giving prompt, but this did not continue. Visuospatial ability assessed using clock drawing.  Pt failed to place numbers on clock but did place hash marks at 12, 3 and 6.  He stated he felt he got the important numbers. Hands were not placed correctly. Language is [facility] and there was no dysarthria noted.  During picture description response was grammatical and accurate, but failed to mention important items on  picture.  He spoke instead about how picture remined him of Madie [place].  Generally, he wanted to talk about things that were bothering him rather than SLP tasks and his spontaneous speech tends to wander, though he was redirectible.    He politely declines ST intervention at this time to address memory and problem solving, but is open to it at next level of care.  Right now he has a lot going on and is stressed abouth is placement.   Would consider psychiatric consult if that has not yet been completed. It does seem that he has been seen by psychiatry service during past admissions.   COGNISTAT: All subtests are within the average range, except where otherwise specified.  Orientation:  8/12, mild impairment, but improving to 10/12 with repetition Attention: 8/8 Comprehension: 5/6 Repetition: 12/12 Naming: 7/8 Construction: assessed with clock drawing, incomplete/exhibits impairment, see above Memory: 4/12, Severe impairment Calculations: 1/4, Moderate impairment Similarities: 7/8 Judgment: 4/6      SLP Assessment   SLP Recommendation/Assessment: All further Speech Lanaguage Pathology  needs can be addressed in the next venue of care SLP Visit Diagnosis: Cognitive communication deficit (R41.841)    Recommendations for follow up therapy are one component of a multi-disciplinary discharge planning process, led by the attending physician.  Recommendations may be updated based on patient status, additional functional criteria and insurance authorization.    Follow Up Recommendations  Skilled nursing-short term rehab (<3 hours/day)    Assistance Recommended at Discharge   Recommend supervision  Functional Status Assessment Patient has had a recent decline in their functional status and/or demonstrates limited ability to make significant improvements in function in a reasonable and predictable amount of time (declines ST services at this time)  Frequency and Duration  N/A         SLP Evaluation Cognition  Overall Cognitive Status: No family/caregiver present to determine baseline cognitive functioning Arousal/Alertness: Awake/alert Orientation Level:  (Some errors with orientation, mild impairment per cognistat) Year:  (2004; corrected with repetition of question) Month: December Day of Week: Correct Attention: Focused;Sustained Focused Attention: Appears intact Sustained Attention: Appears intact Memory: Impaired Memory Impairment: Decreased short term memory Decreased Short Term Memory: Verbal basic Awareness: Appears intact Problem Solving: Impaired Executive Function: Surveyor, quantity Comprehension Overall Auditory Comprehension: Appears within functional limits for tasks assessed Commands: Within Functional Limits Visual Recognition/Discrimination Discrimination: Not tested Reading Comprehension Reading Status: Not tested    Expression Expression Primary Mode of Expression: Verbal Verbal Expression Overall Verbal Expression: Appears within functional limits  for tasks assessed Level of Generative/Spontaneous Verbalization: Conversation Repetition: No impairment Naming: No impairment   Oral / Equities trader Overall Motor Speech: Appears within functional limits for tasks assessed Respiration: Within functional limits Phonation: Normal Resonance: Within functional limits Articulation: Within functional limitis Level of Impairment: Conversation Intelligibility: Intelligible Motor Planning: Witnin functional limits Motor Speech Errors: Not applicable            Kerrie Pleasure, MA, CCC-SLP Acute Rehabilitation Services Office: [phone] 09/17/2023, 10:54 AM

## 2023-09-17 NOTE — Progress Notes (Signed)
   09/17/23 1807  Vitals  Temp 98.7 F (37.1 C)  Pulse Rate (!) 114  Resp 16  BP (!) 108/51  SpO2 100 %  O2 Device Room Air  Oxygen Therapy  Patient Activity (if Appropriate) In bed  Pulse Oximetry Type Continuous  Post Treatment  Dialyzer Clearance Clear  Hemodialysis Intake (mL) 0 mL  Liters Processed 70.4  Fluid Removed (mL) 2500 mL  Tolerated HD Treatment Yes  Post-Hemodialysis Comments Pt tolerated HD treatment without difficulties and the last 14 mins he request to come off the machine. Report was call to 6N Bedside.   Received patient in bed to unit.  Alert and oriented.  Informed consent signed and in chart.   TX duration: 3:21  Patient tolerated well.  Transported back to the room  Alert, without acute distress.  Hand-off given to patient's nurse.   Access used: Yes Access issues: No   Total UF removed: 2500, Pt request to come off the machine the last 14 mins  Medication(s) given: See MAR Post HD VS: See Above Grid Post HD weight: 84. 7 kg   [facility]

## 2023-09-17 NOTE — Plan of Care (Signed)
  Problem: Education: Goal: Knowledge of General Education information will improve Description: Including pain rating scale, medication(s)/side effects and non-pharmacologic comfort measures Outcome: Progressing   Problem: Nutrition: Goal: Adequate nutrition will be maintained Outcome: Progressing   Problem: Coping: Goal: Level of anxiety will decrease Outcome: Progressing   Problem: Pain Management: Goal: General experience of comfort will improve Outcome: Progressing   Problem: Safety: Goal: Ability to remain [place] from injury will improve Outcome: Progressing   Problem: Skin Integrity: Goal: Risk for impaired skin integrity will decrease Outcome: Progressing

## 2023-09-17 NOTE — Progress Notes (Signed)
Progress Note    [name]   NWG:[id number]  DOB: 07-09-1981  DOA: 09/09/2023     8 PCP: [name], NP  Initial CC: endocarditis  Hospital Course: [name] is a 42 y.o. 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  While at [facility] he underwent cardiac cath showing non obstructive disease, craniotomy with abscess drainage on 09/01/23,. He had subarachnoid hemorrhage while on IV heparin, right third toe distal necrosis,was seen by podiatry, but family wanted conservative management.  Since his tertiary care needs have been completed, he was transferred back to [facility] for further management.  Therapy eval recommending SNF.  Interval History:  Cognitive eval done with speech therapy today.  I think it is pretty clear he does have some decent level of impairment.  He also declined being evaluated for memory and problem-solving.  If he does decline SNF when finally offered and found, we will seek probable formal psychiatry evaluation in case of need for seeking guardianship and competency with the court.  Assessment and Plan:  MRSA MV /TV endocarditis Brain abscess s/p craniotomy and abscess drainage on 12/4 Complete 8 week treatment with IV vancomycin. He gets IV vancomycin with HD.  ID signed off.  EOT is 1/29. 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  ESRD on HD MWF -Continue HD per nephrology - On Lokelma on nondialysis days due to patient noncompliant with renal diet  Right third toe necrosis Was seen by podiatry at [facility] recommended surgical intervention, but family opted for conservative management with IV antibiotics  Physical deconditioning - Due to prolonged hospitalization especially recently at [facility] - Continue working with therapy as able - see capacity discussion above; continue seeking SNF beds for now   Type 1 DM insulin dependent.  Pt non compliant to diet  - continue semglee - continue SSI and CBG monitoring    Gastroparesis  Stable.    Left  internal jugular DVT continue eliquis.    Subarachnoid hemorrhage while on Heparin.  Monitor.     Prostate fluid collection.  Dilated Seminal vesicle and no surgical intervention needed.  Recommend outpatient follow up with urology on discharge.    Anemia of chronic disease Hemoglobin around 8 and stable No obvious signs of bleeding     Estimated body mass index is 26.24 kg/m as calculated from the following:   Height as of this encounter: 5\' 7"  (1.702 m).   Weight as of this encounter: 76 kg.   Old records reviewed in assessment of this patient   DVT prophylaxis:   apixaban (ELIQUIS) tablet 5  mg   Code Status:   Code Status: Full Code  Mobility Assessment (Last 72 Hours)     Mobility Assessment     Row Name 09/17/23 1024 09/16/23 2239 09/16/23 1630 09/16/23 1400 09/16/23 1309   Does patient have an order for bedrest or is patient medically unstable No - Continue assessment No - Continue assessment -- -- --   What is  the highest level of mobility based on the progressive mobility assessment? Level 2 (Chairfast) - Balance while sitting on edge of bed and cannot stand Level 2 (Chairfast) - Balance while sitting on edge of bed and cannot stand Level 2 (Chairfast) - Balance while sitting on edge of bed and cannot stand Level 2 (Chairfast) - Balance while sitting on edge of bed and cannot stand Level 2 (Chairfast) - Balance while sitting on edge of bed and cannot stand   Is the above level different from baseline mobility prior to current illness? Yes - Recommend PT order Yes - Recommend PT order -- -- --    Row Name 09/16/23 1300 09/15/23 2027 09/15/23 0843 09/14/23 2017     Does patient have an order for bedrest or is patient medically unstable No - Continue assessment Yes- Bedfast (Level 1) - Complete Yes- Bedfast (Level 1) - Complete Yes- Bedfast (Level 1) - Complete    What is the highest level of mobility based on the progressive mobility assessment? Level 2 (Chairfast) - Balance while sitting on edge of bed and cannot stand Level 1 (Bedfast) - Unable to balance while sitting on edge of bed Level 2 (Chairfast) - Balance while sitting on edge of bed and cannot stand Level 2 (Chairfast) - Balance while sitting on edge of bed and cannot stand    Is the above level different from baseline mobility prior to current illness? Yes - Recommend PT order Yes - Recommend PT order Yes - Recommend PT order Yes - Recommend PT order             Barriers to discharge: none Disposition Plan:  U/K Status is: Inpt  Objective: Blood pressure (!) 113/58, pulse (!) 106, temperature 98.4 F (36.9 C), resp. rate 16, height 5\' 7"  (1.702 m), weight (S) 85.2 kg, SpO2 98%.  Examination:  Physical Exam Constitutional:      General: He is not in acute distress.    Appearance: Normal appearance.     Comments: Disheveled appearance. Curses a lot  HENT:     Head: Normocephalic and atraumatic.     Mouth/Throat:     Mouth: Mucous  membranes are moist.  Eyes:     Extraocular Movements: Extraocular movements intact.  Cardiovascular:     Rate and Rhythm: Normal rate and regular rhythm.  Pulmonary:     Effort: Pulmonary effort is normal. No respiratory distress.     Breath sounds: Normal breath sounds. No wheezing.  Abdominal:     General: Bowel sounds are normal. There is no distension.     Palpations: Abdomen is soft.     Tenderness: There is no abdominal tenderness.  Musculoskeletal:     Cervical back: Normal range of motion and neck supple.     Comments: Left AKA noted  Skin:    General: Skin is warm and dry.  Neurological:     Mental Status: He is alert.     Comments: 0/5 strength in LUE  Psychiatric:        Attention and Perception: He is inattentive.  Mood and Affect: Mood is anxious.        Speech: Speech is rapid and pressured.        Behavior: Behavior is hyperactive.      Consultants:  Nephrology   Procedures:    Data Reviewed: Results for orders placed or performed during the hospital encounter of 09/09/23 (from the past 24 hours)  Glucose, capillary     Status: Abnormal   Collection Time: 09/16/23  5:14 PM  Result Value Ref Range   Glucose-Capillary 273 (H) 70 - 99 mg/dL  Glucose, capillary     Status: Abnormal   Collection Time: 09/16/23 10:43 PM  Result Value Ref Range   Glucose-Capillary 55 (L) 70 - 99 mg/dL  Glucose, capillary     Status: Abnormal   Collection Time: 09/16/23 11:50 PM  Result Value Ref Range   Glucose-Capillary 54 (L) 70 - 99 mg/dL  Glucose, capillary     Status: None   Collection Time: 09/17/23 12:43 AM  Result Value Ref Range   Glucose-Capillary 84 70 - 99 mg/dL  Glucose, capillary     Status: None   Collection Time: 09/17/23 12:45 AM  Result Value Ref Range   Glucose-Capillary 78 70 - 99 mg/dL  Glucose, capillary     Status: Abnormal   Collection Time: 09/17/23  1:53 AM  Result Value Ref Range   Glucose-Capillary 50 (L) 70 - 99 mg/dL  Glucose,  capillary     Status: None   Collection Time: 09/17/23  3:01 AM  Result Value Ref Range   Glucose-Capillary 80 70 - 99 mg/dL  Glucose, capillary     Status: Abnormal   Collection Time: 09/17/23  5:13 AM  Result Value Ref Range   Glucose-Capillary 39 (LL) 70 - 99 mg/dL   Comment 1 Notify RN    Comment 2 Document in Chart   Glucose, capillary     Status: Abnormal   Collection Time: 09/17/23  5:56 AM  Result Value Ref Range   Glucose-Capillary 154 (H) 70 - 99 mg/dL  Glucose, capillary     Status: Abnormal   Collection Time: 09/17/23  7:00 AM  Result Value Ref Range   Glucose-Capillary 111 (H) 70 - 99 mg/dL  Magnesium     Status: None   Collection Time: 09/17/23  7:56 AM  Result Value Ref Range   Magnesium 2.1 1.7 - 2.4 mg/dL  CBC with Differential/Platelet     Status: Abnormal   Collection Time: 09/17/23  7:56 AM  Result Value Ref Range   WBC 5.9 4.0 - 10.5 K/uL   RBC 3.37 (L) 4.22 - 5.81 MIL/uL   Hemoglobin 9.8 (L) 13.0 - 17.0 g/dL   HCT 81.1 (L) 91.4 - 78.2 %   MCV 92.9 80.0 - 100.0 fL   MCH 29.1 26.0 - 34.0 pg   MCHC 31.3 30.0 - 36.0 g/dL   RDW 95.6 21.3 - 08.6 %   Platelets 196 150 - 400 K/uL   nRBC 0.0 0.0 - 0.2 %   Neutrophils Relative % 68 %   Neutro Abs 4.0 1.7 - 7.7 K/uL   Lymphocytes Relative 18 %   Lymphs Abs 1.1 0.7 - 4.0 K/uL   Monocytes Relative 7 %   Monocytes Absolute 0.4 0.1 - 1.0 K/uL   Eosinophils Relative 6 %   Eosinophils Absolute 0.3 0.0 - 0.5 K/uL   Basophils Relative 1 %   Basophils Absolute 0.0 0.0 - 0.1 K/uL   Immature Granulocytes 0 %  Abs Immature Granulocytes 0.02 0.00 - 0.07 K/uL  Renal function panel     Status: Abnormal   Collection Time: 09/17/23  7:57 AM  Result Value Ref Range   Sodium 133 (L) 135 - 145 mmol/L   Potassium 5.5 (H) 3.5 - 5.1 mmol/L   Chloride 95 (L) 98 - 111 mmol/L   CO2 25 22 - 32 mmol/L   Glucose, Bld 115 (H) 70 - 99 mg/dL   BUN 50 (H) 6 - 20 mg/dL   Creatinine, Ser 1.19 (H) 0.61 - 1.24 mg/dL   Calcium 9.7 8.9  - 14.7 mg/dL   Phosphorus 7.3 (H) 2.5 - 4.6 mg/dL   Albumin 2.8 (L) 3.5 - 5.0 g/dL   GFR, Estimated 12 (L) >60 mL/min   Anion gap 13 5 - 15  Glucose, capillary     Status: Abnormal   Collection Time: 09/17/23  8:03 AM  Result Value Ref Range   Glucose-Capillary 120 (H) 70 - 99 mg/dL  Glucose, capillary     Status: Abnormal   Collection Time: 09/17/23  9:02 AM  Result Value Ref Range   Glucose-Capillary 132 (H) 70 - 99 mg/dL  Glucose, capillary     Status: Abnormal   Collection Time: 09/17/23 10:06 AM  Result Value Ref Range   Glucose-Capillary 190 (H) 70 - 99 mg/dL  Glucose, capillary     Status: Abnormal   Collection Time: 09/17/23 10:56 AM  Result Value Ref Range   Glucose-Capillary 175 (H) 70 - 99 mg/dL  Glucose, capillary     Status: Abnormal   Collection Time: 09/17/23 12:08 PM  Result Value Ref Range   Glucose-Capillary 450 (H) 70 - 99 mg/dL    I have reviewed pertinent nursing notes, vitals, labs, and images as necessary. I have ordered labwork to follow up on as indicated.  I have reviewed the last notes from staff over past 24 hours. I have discussed patient's care plan and test results with nursing staff, CM/SW, and other staff as appropriate.    LOS: 8 days   Lewie Chamber, MD [facility] 09/17/2023, 3:43 PM

## 2023-09-17 NOTE — Progress Notes (Signed)
Pt. Blood sugar is 101 and offer pt. Sandwich and other snacks, etc. Pt. States, do not want anything to eat until I go back to my room.

## 2023-09-18 DIAGNOSIS — I120 Hypertensive chronic kidney disease with stage 5 chronic kidney disease or end stage renal disease: Secondary | ICD-10-CM | POA: Diagnosis not present

## 2023-09-18 DIAGNOSIS — B9562 Methicillin resistant Staphylococcus aureus infection as the cause of diseases classified elsewhere: Secondary | ICD-10-CM | POA: Diagnosis not present

## 2023-09-18 DIAGNOSIS — R7881 Bacteremia: Secondary | ICD-10-CM | POA: Diagnosis not present

## 2023-09-18 DIAGNOSIS — I079 Rheumatic tricuspid valve disease, unspecified: Secondary | ICD-10-CM | POA: Diagnosis not present

## 2023-09-18 DIAGNOSIS — I330 Acute and subacute infective endocarditis: Secondary | ICD-10-CM | POA: Diagnosis not present

## 2023-09-18 DIAGNOSIS — N186 End stage renal disease: Secondary | ICD-10-CM | POA: Diagnosis not present

## 2023-09-18 DIAGNOSIS — Z992 Dependence on renal dialysis: Secondary | ICD-10-CM | POA: Diagnosis not present

## 2023-09-18 DIAGNOSIS — R4189 Other symptoms and signs involving cognitive functions and awareness: Secondary | ICD-10-CM | POA: Diagnosis not present

## 2023-09-18 DIAGNOSIS — I38 Endocarditis, valve unspecified: Secondary | ICD-10-CM | POA: Diagnosis not present

## 2023-09-18 DIAGNOSIS — D631 Anemia in chronic kidney disease: Secondary | ICD-10-CM | POA: Diagnosis not present

## 2023-09-18 DIAGNOSIS — N250 Renal osteodystrophy: Secondary | ICD-10-CM | POA: Diagnosis not present

## 2023-09-18 LAB — GLUCOSE, CAPILLARY
Glucose-Capillary: 184 mg/dL — ABNORMAL HIGH (ref 70–99)
Glucose-Capillary: 487 mg/dL — ABNORMAL HIGH (ref 70–99)
Glucose-Capillary: 499 mg/dL — ABNORMAL HIGH (ref 70–99)
Glucose-Capillary: 325 mg/dL — ABNORMAL HIGH (ref 70–99)
Glucose-Capillary: 290 mg/dL — ABNORMAL HIGH (ref 70–99)

## 2023-09-18 MED ORDER — CHLORHEXIDINE GLUCONATE CLOTH 2 % EX PADS
6.0000 | MEDICATED_PAD | Freq: Every day | CUTANEOUS | Status: DC
  Administered 2023-09-19 – 2023-10-08 (×16): 6 via TOPICAL

## 2023-09-18 MED ORDER — INSULIN ASPART 100 UNIT/ML IJ SOLN
0.0000 [IU] | Freq: Three times a day (TID) | INTRAMUSCULAR | Status: DC
  Administered 2023-09-18: 2 [IU] via SUBCUTANEOUS
  Administered 2023-09-18: 4 [IU] via SUBCUTANEOUS
  Administered 2023-09-18: 6 [IU] via SUBCUTANEOUS
  Administered 2023-09-19: 2 [IU] via SUBCUTANEOUS
  Administered 2023-09-19: 6 [IU] via SUBCUTANEOUS
  Administered 2023-09-20: 1 [IU] via SUBCUTANEOUS
  Administered 2023-09-20 – 2023-09-21 (×3): 2 [IU] via SUBCUTANEOUS
  Administered 2023-09-21: 3 [IU] via SUBCUTANEOUS
  Administered 2023-09-22: 6 [IU] via SUBCUTANEOUS
  Administered 2023-09-22: 4 [IU] via SUBCUTANEOUS
  Administered 2023-09-23 – 2023-09-24 (×2): 3 [IU] via SUBCUTANEOUS
  Administered 2023-09-24: 1 [IU] via SUBCUTANEOUS
  Administered 2023-09-25: 3 [IU] via SUBCUTANEOUS
  Administered 2023-09-25 (×2): 2 [IU] via SUBCUTANEOUS
  Administered 2023-09-25: 1 [IU] via SUBCUTANEOUS
  Administered 2023-09-26: 5 [IU] via SUBCUTANEOUS
  Administered 2023-09-26: 2 [IU] via SUBCUTANEOUS
  Administered 2023-09-26: 3 [IU] via SUBCUTANEOUS
  Administered 2023-09-26: 2 [IU] via SUBCUTANEOUS
  Administered 2023-09-27: 4 [IU] via SUBCUTANEOUS
  Administered 2023-09-27: 5 [IU] via SUBCUTANEOUS
  Administered 2023-09-28 (×3): 4 [IU] via SUBCUTANEOUS
  Administered 2023-09-29: 2 [IU] via SUBCUTANEOUS
  Administered 2023-09-29: 5 [IU] via SUBCUTANEOUS
  Administered 2023-09-29: 4 [IU] via SUBCUTANEOUS
  Administered 2023-09-30: 3 [IU] via SUBCUTANEOUS
  Administered 2023-09-30: 6 [IU] via SUBCUTANEOUS
  Administered 2023-09-30 (×2): 3 [IU] via SUBCUTANEOUS
  Administered 2023-10-01 (×2): 1 [IU] via SUBCUTANEOUS
  Administered 2023-10-01 – 2023-10-02 (×2): 3 [IU] via SUBCUTANEOUS
  Administered 2023-10-02 (×2): 2 [IU] via SUBCUTANEOUS
  Administered 2023-10-02: 1 [IU] via SUBCUTANEOUS

## 2023-09-18 MED ORDER — INSULIN GLARGINE-YFGN 100 UNIT/ML ~~LOC~~ SOLN
4.0000 [IU] | Freq: Every day | SUBCUTANEOUS | Status: DC
Start: 2023-09-18 — End: 2023-09-23
  Administered 2023-09-18 – 2023-09-22 (×5): 4 [IU] via SUBCUTANEOUS
  Filled 2023-09-18 (×6): qty 0.04

## 2023-09-18 MED ORDER — MELATONIN 5 MG PO TABS
5.0000 mg | ORAL_TABLET | Freq: Once | ORAL | Status: AC
  Administered 2023-09-18: 5 mg via ORAL
  Filled 2023-09-18: qty 1

## 2023-09-18 NOTE — Progress Notes (Signed)
Patient refused AM labs x2

## 2023-09-18 NOTE — Progress Notes (Signed)
Progress Note    [name]   IHK:[id number]  DOB: 05/29/1981  DOA: 09/09/2023     9 PCP: [name], NP  Initial CC: endocarditis  Hospital Course: HOPE FLAX is a 42 y.o. 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  While at [facility] he underwent cardiac cath showing non obstructive disease, craniotomy with abscess drainage on 09/01/23,. He had subarachnoid hemorrhage while on IV heparin, right third toe distal necrosis,was seen by podiatry, but family wanted conservative management.  Since his tertiary care needs have been completed, he was transferred back to [facility] for further management.  Therapy eval recommending SNF.  Interval History:  Still having some intermittent hypoglycemia with rebounding hyperglycemia.  Appetite okay and he is able to take in adequate nutrition to counter the hypoglycemia.  Assessment and Plan:  MRSA MV /TV endocarditis Brain abscess s/p craniotomy and abscess drainage on 12/4 Complete 8 week treatment with IV vancomycin. He gets IV vancomycin with HD.  ID signed off.  EOT is 1/29. 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  ESRD on HD MWF -Continue HD per nephrology - On Lokelma on nondialysis days due to patient noncompliant with renal diet  Right third toe necrosis Was seen by podiatry at [facility] recommended surgical intervention, but family opted for conservative management with IV antibiotics  Physical deconditioning - Due to prolonged hospitalization especially recently at [facility] - Continue working with therapy as able - see capacity discussion above; continue seeking SNF beds for now   Type 1 DM insulin dependent.  Pt non compliant to diet  - continue semglee - continue SSI and CBG monitoring    Gastroparesis  Stable.    Left  internal jugular DVT continue eliquis.    Subarachnoid hemorrhage while on Heparin.  Monitor.     Prostate fluid collection.  Dilated Seminal vesicle and no surgical intervention needed.  Recommend outpatient follow up with urology on discharge.    Anemia of chronic disease Hemoglobin around 8 and stable No obvious signs of bleeding     Estimated body mass index is 26.24 kg/m as calculated from the following:   Height as of this encounter: 5\' 7"  (1.702 m).   Weight as of this encounter: 76 kg.   Old records reviewed in assessment of this patient   DVT prophylaxis:   apixaban (ELIQUIS) tablet 5 mg   Code Status:   Code Status: Full Code  Mobility Assessment (Last 72 Hours)     Mobility Assessment     Row Name 09/18/23 1000 09/17/23 2055 09/17/23 1334 09/17/23 1024 09/16/23  2239   Does patient have an order for bedrest or is patient medically unstable No - Continue assessment No - Continue assessment -- No - Continue assessment No - Continue assessment   What is the highest level of mobility based on the progressive mobility assessment? Level 2 (Chairfast) - Balance while sitting on edge of bed and cannot stand Level 2  (Chairfast) - Balance while sitting on edge of bed and cannot stand Level 2 (Chairfast) - Balance while sitting on edge of bed and cannot stand Level 2 (Chairfast) - Balance while sitting on edge of bed and cannot stand Level 2 (Chairfast) - Balance while sitting on edge of bed and cannot stand   Is the above level different from baseline mobility prior to current illness? Yes - Recommend PT order -- -- Yes - Recommend PT order Yes - Recommend PT order    Row Name 09/16/23 1630 09/16/23 1400 09/16/23 1309 09/16/23 1300 09/15/23 2027   Does patient have an order for bedrest or is patient medically unstable -- -- -- No - Continue assessment Yes- Bedfast (Level 1) - Complete   What is the highest level of mobility based on the progressive mobility assessment? Level 2 (Chairfast) - Balance while sitting on edge of bed and cannot stand Level 2 (Chairfast) - Balance while sitting on edge of bed and cannot stand Level 2 (Chairfast) - Balance while sitting on edge of bed and cannot stand Level 2 (Chairfast) - Balance while sitting on edge of bed and cannot stand Level 1 (Bedfast) - Unable to balance while sitting on edge of bed   Is the above level different from baseline mobility prior to current illness? -- -- -- Yes - Recommend PT order Yes - Recommend PT order            Barriers to discharge: none Disposition Plan:  U/K Status is: Inpt  Objective: Blood pressure 105/68, pulse (!) 109, temperature 98 F (36.7 C), temperature source Oral, resp. rate 18, height 5\' 7"  (1.702 m), weight (S) 84.7 kg, SpO2 95%.  Examination:  Physical Exam Constitutional:      General: He is not in acute distress.    Appearance: Normal appearance.     Comments: Disheveled appearance. Curses a lot  HENT:     Head: Normocephalic and atraumatic.     Mouth/Throat:     Mouth: Mucous membranes are moist.  Eyes:     Extraocular Movements: Extraocular movements intact.  Cardiovascular:     Rate and Rhythm: Normal  rate and regular rhythm.  Pulmonary:     Effort: Pulmonary effort is normal. No respiratory distress.     Breath sounds: Normal breath sounds. No wheezing.  Abdominal:     General: Bowel sounds are normal. There is no distension.     Palpations: Abdomen is soft.     Tenderness: There is no abdominal tenderness.  Musculoskeletal:     Cervical back: Normal range of motion and neck supple.     Comments: Left AKA noted  Skin:    General: Skin is warm and dry.  Neurological:     Mental Status: He is alert.     Comments: 0/5 strength in LUE  Psychiatric:        Attention and Perception: He is inattentive.        Mood and Affect: Mood is anxious.        Speech: Speech is rapid and pressured.        Behavior: Behavior  is hyperactive.      Consultants:  Nephrology   Procedures:    Data Reviewed: Results for orders placed or performed during the hospital encounter of 09/09/23 (from the past 24 hours)  Glucose, capillary     Status: Abnormal   Collection Time: 09/17/23  5:07 PM  Result Value Ref Range   Glucose-Capillary 101 (H) 70 - 99 mg/dL  Glucose, capillary     Status: None   Collection Time: 09/17/23  6:09 PM  Result Value Ref Range   Glucose-Capillary 72 70 - 99 mg/dL  Glucose, capillary     Status: Abnormal   Collection Time: 09/17/23  8:26 PM  Result Value Ref Range   Glucose-Capillary 60 (L) 70 - 99 mg/dL  Glucose, capillary     Status: None   Collection Time: 09/17/23  9:21 PM  Result Value Ref Range   Glucose-Capillary 93 70 - 99 mg/dL  Glucose, capillary     Status: Abnormal   Collection Time: 09/18/23  1:27 AM  Result Value Ref Range   Glucose-Capillary 184 (H) 70 - 99 mg/dL  Glucose, capillary     Status: Abnormal   Collection Time: 09/18/23  9:00 AM  Result Value Ref Range   Glucose-Capillary 487 (H) 70 - 99 mg/dL  Glucose, capillary     Status: Abnormal   Collection Time: 09/18/23  1:03 PM  Result Value Ref Range   Glucose-Capillary 499 (H) 70 - 99  mg/dL    I have reviewed pertinent nursing notes, vitals, labs, and images as necessary. I have ordered labwork to follow up on as indicated.  I have reviewed the last notes from staff over past 24 hours. I have discussed patient's care plan and test results with nursing staff, CM/SW, and other staff as appropriate.    LOS: 9 days   Lewie Chamber, MD [facility] 09/18/2023, 3:04 PM

## 2023-09-18 NOTE — Progress Notes (Signed)
[facility] Progress Note  Subjective: seen in room. No c/o's.   Vitals:   09/18/23 0100 09/18/23 0600 09/18/23 0914 09/18/23 1507  BP: 99/62 (!) 91/38 105/68 113/79  Pulse: (!) 110 (!) 105 (!) 109   Resp:   18 18  Temp:  98.7 F (37.1 C) 98 F (36.7 C) 97.8 F (36.6 C)  TempSrc:  Axillary Oral Oral  SpO2: 96% 94% 95% 96%  Weight:      Height:        Exam:  alert, nad   no jvd  Chest cta bilat  Cor reg no RG  Abd soft ntnd no ascites   LUE 1-2+ edema    Ext L AKA, no RLE edema   Alert, NF, ox3     L femoral TDC intact    Renal-related home meds: - eliquis 5 bid - rocaltrol 1.5 mcg mwf - gabapentin 100 tid - renagel 1 ac tid - metoprolol 25 bid      OP HD: [facility] MWF  4h   400/1.5  77kg  L fem TDC 2/3 bath  Heparin none   Assessment/ Plan: 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 Not a candidate for surgery here. SP line holiday w/ new TDC placed 10/18. Continues on IV vanc upon return from [place] . Adjusted plan per ID pharm is 750mg  MWF through 10/27/23.  Hyperkalemia - K+ 5.5 pre HD Sunday. Recheck in am.  SP I&D brain abscess: done 12/04 at [facility] much better.  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 Next HD Sunday due to holiday schedule.  Volume: on RA, L arm 1- 2+ edema, no other edema. BP's dropping her lately, will lower UF goal to 2 L.  Anemia: Hb 9s stable. ESA dose increased to 100 mcg sq weekly. Will resume here. 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 Cont binders w/ meals.  Hypertension: BP's controlled, cont metoprolol 25 bid. BP's soft here lately, will add hold orders to metoprolol.  HD access:  L  femoral TDC placed on 10/18. 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 Dispo - could not dialyze well in the chair 12/18 today, pt required the chair be in a supine position or he would have slid out of the chair.        Vinson Moselle MD  [facility] 09/18/2023, 7:12 PM  Recent Labs  Lab 09/15/23 0705 09/16/23 1121 09/17/23 0756 09/17/23 0757  HGB 9.0*  --  9.8*  --   ALBUMIN 2.7* 2.7*  --  2.8*  CALCIUM 9.5 9.7  --  9.7  PHOS 7.1* 6.7*  --  7.3*  CREATININE 6.23* 4.80*  --  5.56*  K 6.2* 5.6*  --  5.5*   No results for input(s): "IRON", "TIBC", "FERRITIN" in the last 168 hours. Inpatient medications:  apixaban  5 mg Oral BID   calcitRIOL  1.5 mcg Oral Q M,W,F-HD   Chlorhexidine Gluconate Cloth  6 each Topical Daily   Chlorhexidine Gluconate Cloth  6 each Topical Q0600   [START ON 09/19/2023] darbepoetin (ARANESP) injection - DIALYSIS  100 mcg Subcutaneous Q Sun-1800   gabapentin  100 mg Oral TID   insulin aspart  0-6 Units Subcutaneous TID AC & HS   insulin glargine-yfgn  4 Units Subcutaneous Daily   loratadine  10 mg Oral Daily   metoprolol tartrate  25 mg Oral BID   pantoprazole  40 mg Oral Daily   povidone-Iodine   Topical Q0600   QUEtiapine  12.5 mg Oral q morning   QUEtiapine  25 mg Oral QHS   senna-docusate  2 tablet Oral BID   sevelamer carbonate  800 mg Oral TID WC   sodium zirconium cyclosilicate  10 g Oral Q T,Th,Sat-1800    heparin sodium (porcine)     vancomycin Stopped (09/17/23 1851)   acetaminophen, clonazepam, diphenhydrAMINE, diphenhydrAMINE-zinc acetate, heparin sodium (porcine), hydrALAZINE, loperamide, oxyCODONE

## 2023-09-18 NOTE — Progress Notes (Signed)
Pt BS is 60 at this time.. pt is having food warmed up for dinner, dinner given, BS reassessed

## 2023-09-19 DIAGNOSIS — R7881 Bacteremia: Secondary | ICD-10-CM | POA: Diagnosis not present

## 2023-09-19 DIAGNOSIS — D631 Anemia in chronic kidney disease: Secondary | ICD-10-CM | POA: Diagnosis not present

## 2023-09-19 DIAGNOSIS — Z992 Dependence on renal dialysis: Secondary | ICD-10-CM | POA: Diagnosis not present

## 2023-09-19 DIAGNOSIS — I079 Rheumatic tricuspid valve disease, unspecified: Secondary | ICD-10-CM | POA: Diagnosis not present

## 2023-09-19 DIAGNOSIS — N250 Renal osteodystrophy: Secondary | ICD-10-CM | POA: Diagnosis not present

## 2023-09-19 DIAGNOSIS — R4189 Other symptoms and signs involving cognitive functions and awareness: Secondary | ICD-10-CM | POA: Diagnosis not present

## 2023-09-19 DIAGNOSIS — N186 End stage renal disease: Secondary | ICD-10-CM | POA: Diagnosis not present

## 2023-09-19 DIAGNOSIS — I330 Acute and subacute infective endocarditis: Secondary | ICD-10-CM | POA: Diagnosis not present

## 2023-09-19 DIAGNOSIS — B9562 Methicillin resistant Staphylococcus aureus infection as the cause of diseases classified elsewhere: Secondary | ICD-10-CM | POA: Diagnosis not present

## 2023-09-19 DIAGNOSIS — I120 Hypertensive chronic kidney disease with stage 5 chronic kidney disease or end stage renal disease: Secondary | ICD-10-CM | POA: Diagnosis not present

## 2023-09-19 DIAGNOSIS — I38 Endocarditis, valve unspecified: Secondary | ICD-10-CM | POA: Diagnosis not present

## 2023-09-19 LAB — CBC WITH DIFFERENTIAL/PLATELET
Abs Immature Granulocytes: 0.03 10*3/uL (ref 0.00–0.07)
Basophils Absolute: 0 10*3/uL (ref 0.0–0.1)
Basophils Relative: 1 %
Eosinophils Absolute: 0.2 10*3/uL (ref 0.0–0.5)
Eosinophils Relative: 3 %
HCT: 27.7 % — ABNORMAL LOW (ref 39.0–52.0)
Hemoglobin: 8.5 g/dL — ABNORMAL LOW (ref 13.0–17.0)
Immature Granulocytes: 1 %
Lymphocytes Relative: 17 %
Lymphs Abs: 1.1 10*3/uL (ref 0.7–4.0)
MCH: 28.9 pg (ref 26.0–34.0)
MCHC: 30.7 g/dL (ref 30.0–36.0)
MCV: 94.2 fL (ref 80.0–100.0)
Monocytes Absolute: 0.4 10*3/uL (ref 0.1–1.0)
Monocytes Relative: 6 %
Neutro Abs: 4.6 10*3/uL (ref 1.7–7.7)
Neutrophils Relative %: 72 %
Platelets: 187 10*3/uL (ref 150–400)
RBC: 2.94 MIL/uL — ABNORMAL LOW (ref 4.22–5.81)
RDW: 15 % (ref 11.5–15.5)
WBC: 6.3 10*3/uL (ref 4.0–10.5)
nRBC: 0 % (ref 0.0–0.2)

## 2023-09-19 LAB — RENAL FUNCTION PANEL
Albumin: 2.5 g/dL — ABNORMAL LOW (ref 3.5–5.0)
Albumin: 2.7 g/dL — ABNORMAL LOW (ref 3.5–5.0)
Anion gap: 14 (ref 5–15)
Anion gap: 13 (ref 5–15)
BUN: 53 mg/dL — ABNORMAL HIGH (ref 6–20)
BUN: 29 mg/dL — ABNORMAL HIGH (ref 6–20)
CO2: 21 mmol/L — ABNORMAL LOW (ref 22–32)
CO2: 25 mmol/L (ref 22–32)
Calcium: 9.1 mg/dL (ref 8.9–10.3)
Calcium: 9.1 mg/dL (ref 8.9–10.3)
Chloride: 97 mmol/L — ABNORMAL LOW (ref 98–111)
Chloride: 97 mmol/L — ABNORMAL LOW (ref 98–111)
Creatinine, Ser: 5.87 mg/dL — ABNORMAL HIGH (ref 0.61–1.24)
Creatinine, Ser: 4.02 mg/dL — ABNORMAL HIGH (ref 0.61–1.24)
GFR, Estimated: 11 mL/min — ABNORMAL LOW (ref >60)
GFR, Estimated: 18 mL/min — ABNORMAL LOW (ref >60)
Glucose, Bld: 196 mg/dL — ABNORMAL HIGH (ref 70–99)
Glucose, Bld: 365 mg/dL — ABNORMAL HIGH (ref 70–99)
Phosphorus: 8.8 mg/dL — ABNORMAL HIGH (ref 2.5–4.6)
Phosphorus: 5.7 mg/dL — ABNORMAL HIGH (ref 2.5–4.6)
Potassium: 6.2 mmol/L — ABNORMAL HIGH (ref 3.5–5.1)
Potassium: 4.9 mmol/L (ref 3.5–5.1)
Sodium: 132 mmol/L — ABNORMAL LOW (ref 135–145)
Sodium: 135 mmol/L (ref 135–145)

## 2023-09-19 LAB — GLUCOSE, CAPILLARY
Glucose-Capillary: 115 mg/dL — ABNORMAL HIGH (ref 70–99)
Glucose-Capillary: 105 mg/dL — ABNORMAL HIGH (ref 70–99)
Glucose-Capillary: 234 mg/dL — ABNORMAL HIGH (ref 70–99)
Glucose-Capillary: 436 mg/dL — ABNORMAL HIGH (ref 70–99)

## 2023-09-19 LAB — MAGNESIUM: Magnesium: 1.8 mg/dL (ref 1.7–2.4)

## 2023-09-19 MED ORDER — HEPARIN SODIUM (PORCINE) 1000 UNIT/ML IJ SOLN: 10000.0000 [IU] | Freq: Once | INTRAMUSCULAR | Status: DC

## 2023-09-19 NOTE — Progress Notes (Addendum)
Pt has bloody drainage from cranial incision on head. Moderate amount of drainage, some dried and some fresh. Pt denied scratching head. Mother bedside, did not see pt scratching head. Incision not yet fully healed. Noted small areas of swelling at end of incision, skin surrounding incision is boggy in places. Pt denies pain or tenderness.       Incision and surrounding area cleansed. Antibiotic ointment applied and mepilex dressing placed over incision.  *Mother requested some type of dressing so pt could not scratch incisional area.

## 2023-09-19 NOTE — Progress Notes (Signed)
Pt refused labs. Will have labs pulled in dialysis.

## 2023-09-19 NOTE — Progress Notes (Signed)
[facility] Progress Note  Subjective: seen in HD.  K+ was 6.2 this am. Pt states he is trying to work harder on his fluid intake and PT.   Vitals:   09/19/23 1330 09/19/23 1351 09/19/23 1400 09/19/23 1550  BP: 129/80  (!) 134/95 100/70  Pulse: (!) 31 96  72  Resp: 18 14 16 17   Temp:    98.5 F (36.9 C)  TempSrc:    Oral  SpO2: (!) 87%   93%  Weight:      Height:        Exam:  alert, nad   no jvd  Chest cta bilat  Cor reg no RG  Abd soft ntnd no ascites   LUE 1+ edema    Ext L AKA, no RLE edema   Alert, NF, ox3     L femoral TDC intact    Renal-related home meds: - eliquis 5 bid - rocaltrol 1.5 mcg mwf - gabapentin 100 tid - renagel 1 ac tid - metoprolol 25 bid      OP HD: [facility] MWF  4h   400/1.5  77kg  L fem TDC 2/3 bath  Heparin none   Assessment/ Plan: 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 Not a candidate for surgery here. SP line holiday w/ new TDC placed 10/18. Continues on IV vanc upon return from [place] . Adjusted plan per ID pharm is 750mg  MWF through 10/27/23.  Hyperkalemia - 6.2 this am. Refusing renal diet. Giving lokelma 10mg  three times per week, will ^ to daily for now. I did not get to HD unit in time to order 1K bath. Next time consider 1K bath for 1-2 hrs or the whole session depending on pt's labs.  SP I&D brain abscess: done 12/04 at [facility] much better.  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 Next HD today on holiday schedule. HD completed. .  Volume: on RA, L arm + edema, no other edema. BP's dropping here lately. Lowered UF to 2 L today.  Anemia: Hb 9s stable. ESA dose increased to 100 mcg sq weekly. Will resume here. 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 Cont binders w/ meals.  Hypertension: BP's controlled, cont metoprolol 25 bid. BP's soft lately. May need to make adjustments this week.  HD access:  L  femoral TDC placed on 10/18. Pt has occlusion of  bilateral subclavian veins limiting his options for dialysis access. Dispo - could not dialyze well in the chair 12/18 today, pt required the chair be in a supine position or he would have slid out of the chair. Told him he will try again this week.       Vinson Moselle MD  [facility] 09/19/2023, 5:38 PM  Recent Labs  Lab 09/15/23 0705 09/16/23 1121 09/17/23 0756 09/17/23 0757 09/19/23 1035  HGB 9.0*  --  9.8*  --   --   ALBUMIN 2.7*   < >  --  2.8* 2.5*  CALCIUM 9.5   < >  --  9.7 9.1  PHOS 7.1*   < >  --  7.3* 8.8*  CREATININE 6.23*   < >  --  5.56* 5.87*  K 6.2*   < >  --  5.5* 6.2*   < > = values in this interval not displayed.   No results for input(s): "IRON", "TIBC", "FERRITIN" in the last 168 hours. Inpatient medications:  apixaban  5 mg Oral  BID   calcitRIOL  1.5 mcg Oral Q M,W,F-HD   Chlorhexidine Gluconate Cloth  6 each Topical Q0600   darbepoetin (ARANESP) injection - DIALYSIS  100 mcg Subcutaneous Q Sun-1800   gabapentin  100 mg Oral TID   insulin aspart  0-6 Units Subcutaneous TID AC & HS   insulin glargine-yfgn  4 Units Subcutaneous Daily   loratadine  10 mg Oral Daily   metoprolol tartrate  25 mg Oral BID   pantoprazole  40 mg Oral Daily   povidone-Iodine   Topical Q0600   QUEtiapine  12.5 mg Oral q morning   QUEtiapine  25 mg Oral QHS   senna-docusate  2 tablet Oral BID   sevelamer carbonate  800 mg Oral TID WC   sodium zirconium cyclosilicate  10 g Oral Q T,Th,Sat-1800    heparin sodium (porcine)     vancomycin Stopped (09/17/23 1851)   acetaminophen, clonazepam, diphenhydrAMINE, diphenhydrAMINE-zinc acetate, heparin sodium (porcine), hydrALAZINE, loperamide, oxyCODONE

## 2023-09-19 NOTE — Progress Notes (Signed)
Progress Note    [name]   ZOX:[id number]  DOB: 25-Dec-1980  DOA: 09/09/2023     10 PCP: [name], NP  Initial CC: endocarditis  Hospital Course: [name] is a 42 y.o. male with medical history significant of ESRD on hemodialysis (not fully compliant), insulin-dependent type 1 diabetes, gastroparesis, Left AKA, admitted to [facility] on 07/09/23 for DKA, Sepsis and hospital course significant for MV AND TV  MRSA endocarditis, followed by acute MCA stroke , left occipital and bilateral cerebellar strokes consistent with septic emboli,  Brain abscess from MRSA, left IJ DVT was transferred to [facility] for MV repair on 11/29.  While at [facility] he underwent cardiac cath showing non obstructive disease, craniotomy with abscess drainage on 09/01/23,. He had subarachnoid hemorrhage while on IV heparin, right third toe distal necrosis,was seen by podiatry, but family wanted conservative management.  Since his tertiary care needs have been completed, he was transferred back to [facility] for further management.  Therapy eval recommending SNF.  Interval History:  Scalp noted to have bled some overnight from incision. Unclear if he's scratched it or it rubbed in bed some. Dressing on it in place this morning.   Assessment and Plan:  MRSA MV /TV endocarditis Brain abscess s/p craniotomy and abscess drainage on 12/4 Complete 8 week treatment with IV vancomycin. He gets IV vancomycin with HD.  ID signed off.  EOT is 1/29. 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  ESRD on HD MWF -Continue HD per nephrology - On Lokelma on nondialysis days due to patient noncompliant with renal diet  Right third toe necrosis Was seen by podiatry at [facility] recommended surgical intervention, but family opted for conservative management with IV antibiotics  Physical deconditioning - Due to prolonged hospitalization especially recently at [facility] - Continue working with therapy as able - see capacity discussion above; continue seeking SNF beds for now   Type 1 DM insulin dependent.  Pt non compliant to diet  - continue semglee - continue SSI and CBG monitoring    Gastroparesis  Stable.    Left  internal jugular DVT continue eliquis.    Subarachnoid hemorrhage while on Heparin.  Monitor.     Prostate fluid collection.  Dilated Seminal vesicle and no surgical intervention needed.  Recommend outpatient follow up with urology on discharge.    Anemia of chronic disease Hemoglobin around 8 and stable No obvious signs of bleeding     Estimated body mass index is 26.24 kg/m as calculated from the following:   Height as of this encounter: 5\' 7"  (1.702 m).   Weight as of this encounter: 76 kg.   Old records reviewed in assessment of this patient   DVT prophylaxis:   apixaban (ELIQUIS) tablet 5 mg   Code Status:   Code Status: Full Code  Mobility Assessment (Last 72 Hours)  Mobility Assessment     Row Name 09/18/23 2100 09/18/23 1000 09/17/23 2055 09/17/23 1334 09/17/23 1024   Does patient have an order for bedrest or is patient medically unstable No - Continue assessment No - Continue assessment No - Continue assessment -- No - Continue assessment   What is the highest level of mobility based on the progressive mobility assessment? Level 2 (Chairfast)  - Balance while sitting on edge of bed and cannot stand Level 2 (Chairfast) - Balance while sitting on edge of bed and cannot stand Level 2 (Chairfast) - Balance while sitting on edge of bed and cannot stand Level 2 (Chairfast) - Balance while sitting on edge of bed and cannot stand Level 2 (Chairfast) - Balance while sitting on edge of bed and cannot stand   Is the above level different from baseline mobility prior to current illness? Yes - Recommend PT order Yes - Recommend PT order -- -- Yes - Recommend PT order    Row Name 09/16/23 2239 09/16/23 1630 09/16/23 1400 09/16/23 1309 09/16/23 1300   Does patient have an order for bedrest or is patient medically unstable No - Continue assessment -- -- -- No - Continue assessment   What is the highest level of mobility based on the progressive mobility assessment? Level 2 (Chairfast) - Balance while sitting on edge of bed and cannot stand Level 2 (Chairfast) - Balance while sitting on edge of bed and cannot stand Level 2 (Chairfast) - Balance while sitting on edge of bed and cannot stand Level 2 (Chairfast) - Balance while sitting on edge of bed and cannot stand Level 2 (Chairfast) - Balance while sitting on edge of bed and cannot stand   Is the above level different from baseline mobility prior to current illness? Yes - Recommend PT order -- -- -- Yes - Recommend PT order            Barriers to discharge: none Disposition Plan:  U/K Status is: Inpt  Objective: Blood pressure 106/75, pulse (!) 108, temperature 98.6 F (37 C), resp. rate (!) 21, height 5\' 7"  (1.702 m), weight 82.4 kg, SpO2 99%.  Examination:  Physical Exam Constitutional:      General: He is not in acute distress.    Appearance: Normal appearance.     Comments: Disheveled appearance. Curses a lot  HENT:     Head: Normocephalic and atraumatic.     Comments: Right scalp incision noted with 2 sutures on opposite ends and healing incision; now with dried blood in place and  dressing    Mouth/Throat:     Mouth: Mucous membranes are moist.  Eyes:     Extraocular Movements: Extraocular movements intact.  Cardiovascular:     Rate and Rhythm: Normal rate and regular rhythm.  Pulmonary:     Effort: Pulmonary effort is normal. No respiratory distress.     Breath sounds: Normal breath sounds. No wheezing.  Abdominal:     General: Bowel sounds are normal. There is no distension.     Palpations: Abdomen is soft.     Tenderness: There is no abdominal tenderness.  Musculoskeletal:     Cervical back: Normal range of motion and neck supple.     Comments: Left AKA noted  Skin:    General: Skin is warm and dry.  Neurological:     Mental Status: He is alert.     Comments: 0/5 strength in LUE  Psychiatric:        Attention and Perception:  He is inattentive.        Mood and Affect: Mood is anxious.        Speech: Speech is rapid and pressured.        Behavior: Behavior is hyperactive.      Consultants:  Nephrology   Procedures:    Data Reviewed: Results for orders placed or performed during the hospital encounter of 09/09/23 (from the past 24 hours)  Glucose, capillary     Status: Abnormal   Collection Time: 09/18/23  1:03 PM  Result Value Ref Range   Glucose-Capillary 499 (H) 70 - 99 mg/dL  Glucose, capillary     Status: Abnormal   Collection Time: 09/18/23  5:18 PM  Result Value Ref Range   Glucose-Capillary 325 (H) 70 - 99 mg/dL  Glucose, capillary     Status: Abnormal   Collection Time: 09/18/23 10:27 PM  Result Value Ref Range   Glucose-Capillary 290 (H) 70 - 99 mg/dL  Glucose, capillary     Status: Abnormal   Collection Time: 09/19/23  5:20 AM  Result Value Ref Range   Glucose-Capillary 115 (H) 70 - 99 mg/dL  Glucose, capillary     Status: Abnormal   Collection Time: 09/19/23  7:49 AM  Result Value Ref Range   Glucose-Capillary 105 (H) 70 - 99 mg/dL  Renal function panel     Status: Abnormal   Collection Time: 09/19/23 10:35 AM  Result  Value Ref Range   Sodium 132 (L) 135 - 145 mmol/L   Potassium 6.2 (H) 3.5 - 5.1 mmol/L   Chloride 97 (L) 98 - 111 mmol/L   CO2 21 (L) 22 - 32 mmol/L   Glucose, Bld 196 (H) 70 - 99 mg/dL   BUN 53 (H) 6 - 20 mg/dL   Creatinine, Ser 1.61 (H) 0.61 - 1.24 mg/dL   Calcium 9.1 8.9 - 09.6 mg/dL   Phosphorus 8.8 (H) 2.5 - 4.6 mg/dL   Albumin 2.5 (L) 3.5 - 5.0 g/dL   GFR, Estimated 11 (L) >60 mL/min   Anion gap 14 5 - 15    I have reviewed pertinent nursing notes, vitals, labs, and images as necessary. I have ordered labwork to follow up on as indicated.  I have reviewed the last notes from staff over past 24 hours. I have discussed patient's care plan and test results with nursing staff, CM/SW, and other staff as appropriate.    LOS: 10 days   Lewie Chamber, MD [facility] 09/19/2023, 12:40 PM

## 2023-09-19 NOTE — Progress Notes (Signed)
   09/19/23 1400  Vitals  BP (!) 134/95  MAP (mmHg) 94  BP Location Right Arm  BP Method Automatic  Patient Position (if appropriate) Lying  Pulse Rate Source Monitor  ECG Heart Rate (!) 115  Resp 16  During Treatment Monitoring  Blood Flow Rate (mL/min) 0 mL/min  Arterial Pressure (mmHg) -0.8 mmHg  Venous Pressure (mmHg) -2.02 mmHg  TMP (mmHg) -50.5 mmHg  Ultrafiltration Rate (mL/min) 873 mL/min  Dialysate Flow Rate (mL/min) 299 ml/min  Duration of HD Treatment -hour(s) 3.25 hour(s)  Cumulative Fluid Removed (mL) per Treatment  1500.15  HD Safety Checks Performed Yes  Intra-Hemodialysis Comments Tx completed  Post Treatment  Dialyzer Clearance Clear  Hemodialysis Intake (mL) 0 mL  Liters Processed 78  Fluid Removed (mL) 1500 mL  Tolerated HD Treatment Yes  Post-Hemodialysis Comments tolerated well goal met no s/s of distress to note  Hemodialysis Catheter Left Femoral vein Double lumen Permanent (Tunneled)  Placement Date/Time: 07/16/23 1318   Serial / Lot #: [id number]  Expiration Date: 05/24/26  Time Out: Correct patient;Correct site;Correct procedure  Maximum sterile barrier precautions: Hand hygiene;Cap;Mask;Sterile gown;Sterile gloves;Large sterile ...  Site Condition No complications  Blue Lumen Status Flushed;Dead end cap in place  Red Lumen Status Flushed;Dead end cap in place  Purple Lumen Status N/A  Catheter fill solution Heparin 1000 units/ml  Catheter fill volume (Arterial) 2.3 cc  Catheter fill volume (Venous) 2.3  Dressing Type Transparent  Dressing Status Antimicrobial disc in place  Drainage Description None  Dressing Change Due 09/22/23  Post treatment catheter status Capped and Clamped   Received patient in bed to unit.  Alert and oriented.  Informed consent signed and in chart.   TX duration:3.15  Patient tolerated well.  Transported back to the room  Alert, without acute distress.  Hand-off given to patient's nurse.   Access used: L Femoral  cath Access issues: reversed  Total UF removed: 1500 Medication(s) given: none Post HD VS: see above Post HD weight: 81kg   [facility]

## 2023-09-19 NOTE — Plan of Care (Signed)
  Problem: Education: Goal: Knowledge of General Education information will improve Description: Including pain rating scale, medication(s)/side effects and non-pharmacologic comfort measures Outcome: Progressing   Problem: Clinical Measurements: Goal: Respiratory complications will improve Outcome: Progressing   Problem: Nutrition: Goal: Adequate nutrition will be maintained Outcome: Progressing   Problem: Coping: Goal: Level of anxiety will decrease Outcome: Progressing   Problem: Elimination: Goal: Will not experience complications related to bowel motility Outcome: Progressing Goal: Will not experience complications related to urinary retention Outcome: Progressing   Problem: Pain Management: Goal: General experience of comfort will improve Outcome: Progressing   Problem: Safety: Goal: Ability to remain free from injury will improve Outcome: Progressing   Problem: Coping: Goal: Ability to adjust to condition or change in health will improve Outcome: Progressing   Problem: Metabolic: Goal: Ability to maintain appropriate glucose levels will improve Outcome: Progressing   Problem: Nutritional: Goal: Maintenance of adequate nutrition will improve Outcome: Progressing   Problem: Skin Integrity: Goal: Risk for impaired skin integrity will decrease Outcome: Progressing

## 2023-09-20 DIAGNOSIS — Z992 Dependence on renal dialysis: Secondary | ICD-10-CM | POA: Diagnosis not present

## 2023-09-20 DIAGNOSIS — I330 Acute and subacute infective endocarditis: Secondary | ICD-10-CM | POA: Diagnosis not present

## 2023-09-20 DIAGNOSIS — I38 Endocarditis, valve unspecified: Secondary | ICD-10-CM | POA: Diagnosis not present

## 2023-09-20 DIAGNOSIS — I120 Hypertensive chronic kidney disease with stage 5 chronic kidney disease or end stage renal disease: Secondary | ICD-10-CM | POA: Diagnosis not present

## 2023-09-20 DIAGNOSIS — N250 Renal osteodystrophy: Secondary | ICD-10-CM | POA: Diagnosis not present

## 2023-09-20 DIAGNOSIS — N186 End stage renal disease: Secondary | ICD-10-CM | POA: Diagnosis not present

## 2023-09-20 DIAGNOSIS — B9562 Methicillin resistant Staphylococcus aureus infection as the cause of diseases classified elsewhere: Secondary | ICD-10-CM | POA: Diagnosis not present

## 2023-09-20 DIAGNOSIS — R4189 Other symptoms and signs involving cognitive functions and awareness: Secondary | ICD-10-CM | POA: Diagnosis not present

## 2023-09-20 DIAGNOSIS — R7881 Bacteremia: Secondary | ICD-10-CM | POA: Diagnosis not present

## 2023-09-20 DIAGNOSIS — D631 Anemia in chronic kidney disease: Secondary | ICD-10-CM | POA: Diagnosis not present

## 2023-09-20 LAB — CBC WITH DIFFERENTIAL/PLATELET
Abs Immature Granulocytes: 0.01 10*3/uL (ref 0.00–0.07)
Basophils Absolute: 0 10*3/uL (ref 0.0–0.1)
Basophils Relative: 1 %
Eosinophils Absolute: 0.3 10*3/uL (ref 0.0–0.5)
Eosinophils Relative: 4 %
HCT: 27.1 % — ABNORMAL LOW (ref 39.0–52.0)
Hemoglobin: 8.3 g/dL — ABNORMAL LOW (ref 13.0–17.0)
Immature Granulocytes: 0 %
Lymphocytes Relative: 16 %
Lymphs Abs: 1.2 10*3/uL (ref 0.7–4.0)
MCH: 28.8 pg (ref 26.0–34.0)
MCHC: 30.6 g/dL (ref 30.0–36.0)
MCV: 94.1 fL (ref 80.0–100.0)
Monocytes Absolute: 0.7 10*3/uL (ref 0.1–1.0)
Monocytes Relative: 10 %
Neutro Abs: 5.1 10*3/uL (ref 1.7–7.7)
Neutrophils Relative %: 69 %
Platelets: 203 10*3/uL (ref 150–400)
RBC: 2.88 MIL/uL — ABNORMAL LOW (ref 4.22–5.81)
RDW: 14.9 % (ref 11.5–15.5)
WBC: 7.3 10*3/uL (ref 4.0–10.5)
nRBC: 0 % (ref 0.0–0.2)

## 2023-09-20 LAB — RENAL FUNCTION PANEL
Albumin: 2.6 g/dL — ABNORMAL LOW (ref 3.5–5.0)
Anion gap: 12 (ref 5–15)
BUN: 37 mg/dL — ABNORMAL HIGH (ref 6–20)
CO2: 27 mmol/L (ref 22–32)
Calcium: 9.1 mg/dL (ref 8.9–10.3)
Chloride: 95 mmol/L — ABNORMAL LOW (ref 98–111)
Creatinine, Ser: 4.89 mg/dL — ABNORMAL HIGH (ref 0.61–1.24)
GFR, Estimated: 14 mL/min — ABNORMAL LOW (ref >60)
Glucose, Bld: 55 mg/dL — ABNORMAL LOW (ref 70–99)
Phosphorus: 6.8 mg/dL — ABNORMAL HIGH (ref 2.5–4.6)
Potassium: 5.3 mmol/L — ABNORMAL HIGH (ref 3.5–5.1)
Sodium: 134 mmol/L — ABNORMAL LOW (ref 135–145)

## 2023-09-20 LAB — GLUCOSE, CAPILLARY
Glucose-Capillary: 165 mg/dL — ABNORMAL HIGH (ref 70–99)
Glucose-Capillary: 115 mg/dL — ABNORMAL HIGH (ref 70–99)
Glucose-Capillary: 136 mg/dL — ABNORMAL HIGH (ref 70–99)
Glucose-Capillary: 208 mg/dL — ABNORMAL HIGH (ref 70–99)

## 2023-09-20 LAB — MAGNESIUM: Magnesium: 1.8 mg/dL (ref 1.7–2.4)

## 2023-09-20 MED ORDER — BACITRACIN ZINC 500 UNIT/GM EX OINT
TOPICAL_OINTMENT | Freq: Every day | CUTANEOUS | Status: DC
Start: 2023-09-20 — End: 2023-11-22
  Administered 2023-09-20 – 2023-11-15 (×45): 31.5 via TOPICAL
  Administered 2023-11-16: 1 via TOPICAL
  Administered 2023-11-17 – 2023-11-21 (×4): 31.5 via TOPICAL
  Filled 2023-09-20 (×3): qty 28.35

## 2023-09-20 MED ORDER — CHLORHEXIDINE GLUCONATE CLOTH 2 % EX PADS
6.0000 | MEDICATED_PAD | Freq: Every day | CUTANEOUS | Status: DC
  Administered 2023-09-22 – 2023-09-24 (×3): 6 via TOPICAL

## 2023-09-20 MED ORDER — VANCOMYCIN HCL 750 MG/150ML IV SOLN
750.0000 mg | Freq: Once | INTRAVENOUS | Status: DC
  Filled 2023-09-20: qty 150

## 2023-09-20 MED ORDER — SODIUM CHLORIDE 0.9 % IV BOLUS
250.0000 mL | Freq: Once | INTRAVENOUS | Status: DC
Start: 2023-09-20 — End: 2023-09-20

## 2023-09-20 MED ORDER — VANCOMYCIN HCL 1000 MG IV SOLR
750.0000 mg | Freq: Once | INTRAVENOUS | Status: AC
  Administered 2023-09-20: 750 mg via INTRAVENOUS
  Filled 2023-09-20: qty 15

## 2023-09-20 MED ORDER — VANCOMYCIN HCL 1000 MG IV SOLR
750.0000 mg | INTRAVENOUS | Status: DC
  Filled 2023-09-20: qty 15

## 2023-09-20 MED ORDER — SODIUM ZIRCONIUM CYCLOSILICATE 10 G PO PACK
10.0000 g | PACK | Freq: Every day | ORAL | Status: DC
Start: 2023-09-20 — End: 2023-09-23
  Administered 2023-09-20 – 2023-09-22 (×3): 10 g via ORAL
  Filled 2023-09-20 (×2): qty 1

## 2023-09-20 NOTE — Inpatient Diabetes Management (Signed)
Inpatient Diabetes Program Recommendations  AACE/ADA: New Consensus Statement on Inpatient Glycemic Control  Target Ranges:  Prepandial:   less than 140 mg/dL      Peak postprandial:   less than 180 mg/dL (1-2 hours)      Critically ill patients:  140 - 180 mg/dL    Latest Reference Range & Units 09/19/23 05:20 09/19/23 07:49 09/19/23 15:45 09/19/23 22:19  Glucose-Capillary 70 - 99 mg/dL 045 (H) 409 (H) 811 (H) 436 (H)    Review of Glycemic Control  Diabetes history: DM1 Outpatient Diabetes medications: Lantus 4 units daily, Humalog 0-4 units TID with meals Current orders for Inpatient glycemic control: Semglee 4 units daily, Novolog 0-6 units AC&HS  Inpatient Diabetes Program Recommendations:    Insulin: Please consider increasing Semglee to 5 units daily and adding Novolog 2 units TID with meals for meal coverage if patient eats at least 50% of meals.  Thanks, Orlando Penner, RN, MSN, CDCES Diabetes Coordinator Inpatient Diabetes Program [phone] (Team Pager from 8am to 5pm)

## 2023-09-20 NOTE — Progress Notes (Signed)
[facility] Progress Note   Subjective: Seen in room, arguing with RN. Fortunately he is happy with [place]. He says dialysis is fine then went back to discussion with RN.     Objective Vitals:   09/19/23 1550 09/19/23 2222 09/20/23 0451 09/20/23 0820  BP: 100/70 (!) 109/46 (!) 83/49 (!) 110/41  Pulse: 72 100 100 86  Resp: 17 20 16 18   Temp: 98.5 F (36.9 C) 98.9 F (37.2 C) 98.7 F (37.1 C) 98.6 F (37 C)  TempSrc: Oral Axillary Axillary Oral  SpO2: 93% 95% 100% 90%  Weight:      Height:       Physical Exam General: Chronically ill appearing male in NAD Heart:S1,S2 RRR No M/R/G Lungs: CTAB no WOB Abdomen: NABS, NT Extremities: L AKA No RLE edema. Edema LUE Dialysis Access: L femoral TDC drsg intact  Dialysis Orders:  Additional Objective Labs: Basic Metabolic Panel: Recent Labs  Lab 09/19/23 1035 09/19/23 2030 09/20/23 1258  NA 132* 135 134*  K 6.2* 4.9 5.3*  CL 97* 97* 95*  CO2 21* 25 27  GLUCOSE 196* 365* 55*  BUN 53* 29* 37*  CREATININE 5.87* 4.02* 4.89*  CALCIUM 9.1 9.1 9.1  PHOS 8.8* 5.7* 6.8*   Liver Function Tests: Recent Labs  Lab 09/19/23 1035 09/19/23 2030 09/20/23 1258  ALBUMIN 2.5* 2.7* 2.6*   No results for input(s): "LIPASE", "AMYLASE" in the last 168 hours. CBC: Recent Labs  Lab 09/15/23 0705 09/17/23 0756 09/19/23 2030 09/20/23 1258  WBC 5.5 5.9 6.3 7.3  NEUTROABS  --  4.0 4.6 5.1  HGB 9.0* 9.8* 8.5* 8.3*  HCT 29.3* 31.3* 27.7* 27.1*  MCV 94.8 92.9 94.2 94.1  PLT 176 196 187 203   Blood Culture    Component Value Date/Time   SDES BLOOD SITE NOT SPECIFIED 08/25/2023 1706   SPECREQUEST  08/25/2023 1706    BOTTLES DRAWN AEROBIC ONLY Blood Culture results may not be optimal due to an inadequate volume of blood received in culture bottles   CULT  08/25/2023 1706    NO GROWTH 5 DAYS Performed at [facility], [address], [place], [name] [address]    REPTSTATUS 08/30/2023 FINAL 08/25/2023 1706     Cardiac Enzymes: No results for input(s): "CKTOTAL", "CKMB", "CKMBINDEX", "TROPONINI" in the last 168 hours. CBG: Recent Labs  Lab 09/19/23 0749 09/19/23 1545 09/19/23 2219 09/20/23 0819 09/20/23 1221  GLUCAP 105* 234* 436* 165* 115*   Iron Studies: No results for input(s): "IRON", "TIBC", "TRANSFERRIN", "FERRITIN" in the last 72 hours. @lablastinr3 @ Studies/Results: No results found. Medications:  heparin sodium (porcine)     vancomycin      apixaban  5 mg Oral BID   bacitracin   Topical Daily   calcitRIOL  1.5 mcg Oral Q M,W,F-HD   Chlorhexidine Gluconate Cloth  6 each Topical Q0600   darbepoetin (ARANESP) injection - DIALYSIS  100 mcg Subcutaneous Q Sun-1800   gabapentin  100 mg Oral TID   insulin aspart  0-6 Units Subcutaneous TID AC & HS   insulin glargine-yfgn  4 Units Subcutaneous Daily   loratadine  10 mg Oral Daily   metoprolol tartrate  25 mg Oral BID   pantoprazole  40 mg Oral Daily   povidone-Iodine   Topical Q0600   QUEtiapine  12.5 mg Oral q morning   QUEtiapine  25 mg Oral QHS   senna-docusate  2 tablet Oral BID   sevelamer carbonate  800 mg Oral TID WC  sodium zirconium cyclosilicate  10 g Oral Q T,Th,Sat-1800     OP HD: [facility] MWF  4h   400/1.5  77kg  L fem TDC 2/3 bath  Heparin none   Assessment/ Plan: 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 Not a candidate for surgery here. SP line holiday w/ new TDC placed 10/18. Continues on IV vanc upon return from [place] . Adjusted plan per ID pharm is 750mg  MWF through 10/27/23.  Hyperkalemia - K+ 5.3 today after being started on daily Lokelma. Patient refuses renal diet. Follow labs.  SP I&D brain abscess: done 12/04 at [facility] much better.  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 Next HD today on holiday schedule. HD completed. .  Volume: on RA, L arm + edema, no other edema. BP's dropping here lately. Lowered UF to 2 L today.   Anemia: Hb 8.3. ESA dose increased to 100 mcg sq weekly. Will resume here. Hold iron infusion while on IV ABX. 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 Cont binders w/ meals.  Hypertension: BP's controlled, cont metoprolol 25 bid. BP's soft lately. May need to make adjustments this week.  HD access:  L  femoral TDC placed on 10/18. 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 Dispo - could not dialyze well in the chair 12/18 today, pt required the chair be in a supine position or he would have slid out of the chair. Continue to attempt to have HD in recliner     Haislee Corso H. Hind Chesler NP-C 09/20/2023, 2:21 PM  [employer] [phone]

## 2023-09-20 NOTE — TOC Progression Note (Signed)
Transition of Care [facility]) - Progression Note    Patient Details  Name: [name] MRN: [id number] Date of Birth: 1980-12-22  [facility]) CM/SW Contact  Carley Hammed, LCSW Phone Number: 09/20/2023, 3:41 PM  Clinical Narrative:    TOC continues to follow for disposition needs. Nephrology following and will advise when pt is able to sit for HD, in order to receive HD in the community. Several facilities following and will determine acceptance when closer to be medically stable. TOC will continue to follow.         Expected Discharge Plan and Services                                               Social Determinants of Health (SDOH) Interventions SDOH Screenings   Food Insecurity: No Food Insecurity (09/09/2023)  Housing: Unknown (09/09/2023)  Transportation Needs: No Transportation Needs (09/09/2023)  Utilities: Not At Risk (09/09/2023)  Alcohol Screen: Low Risk  (02/15/2023)  Depression (PHQ2-9): Low Risk  (02/15/2023)  Financial Resource Strain: Low Risk  (02/15/2023)  Physical Activity: Inactive (02/15/2023)  Social Connections: Socially Integrated (02/15/2023)  Stress: No Stress Concern Present (02/15/2023)  Tobacco Use: Low Risk  (09/12/2023)  Recent Concern: Tobacco Use - Medium Risk (09/01/2023)   Received from [facility]    Readmission Risk Interventions     No data to display

## 2023-09-20 NOTE — Care Management Important Message (Signed)
Important Message  Patient Details  Name: [name] MRN: [id number] Date of Birth: June 01, 1981   Important Message Given:  Yes - Medicare IM     Sherilyn Banker 09/20/2023, 3:22 PM

## 2023-09-20 NOTE — Consult Note (Signed)
[facility] Liaison Consult Note    09/20/2023  [name] Jul 24, 1981 [id number]  Insurance:  [employer]   Primary Care Provider: [name], NP,  this provider is listed for the transition of care follow up appointments     [facility] Liaison reviewed for hospitalization and disposition needs.    The patient was screened for 30 day readmission hospitalization with noted extreme risk score for unplanned readmission risk 3 hospital admissions in 6 months.  The patient was assessed for potential Community Care Coordination service needs for post hospital transition for care coordination. Review of patient's electronic medical record reveals patient is admitted from [facility] with Endocarditis..   Plan: [facility] Liaison will continue to follow progress and disposition to asess for post hospital community care coordination/management needs.  Referral request for community care coordination: following, not medically ready.   [facility] Community Care, Population Health does not replace or interfere with any arrangements made by the Inpatient Transition of Care team.   For questions contact:   Charlesetta Shanks, RN, BSN, CCM [place]  [facility], [facility] Liaison Direct Dial: [phone] or secure chat Email: [email or web address]

## 2023-09-20 NOTE — Progress Notes (Signed)
Progress Note    [name]   ZOX:[id number]  DOB: May 01, 1981  DOA: 09/09/2023     11 PCP: [name], NP  Initial CC: endocarditis  Hospital Course: [name] is a 42 y.o. 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  While at [facility] he underwent cardiac cath showing non obstructive disease, craniotomy with abscess drainage on 09/01/23,. He had subarachnoid hemorrhage while on IV heparin, right third toe distal necrosis,was seen by podiatry, but family wanted conservative management.  Since his tertiary care needs have been completed, he was transferred back to [facility] for further management.  Therapy eval recommending SNF.  Interval History:  Scalp incision looks okay today. Siting in recliner this morning; says practicing in order to work on sitting in HD chair for sessions.   Assessment and Plan:  MRSA MV /TV endocarditis Brain abscess s/p craniotomy and abscess drainage on 12/4 Complete 8 week treatment with IV vancomycin. He gets IV vancomycin with HD.  ID signed off.  EOT is 1/29. 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  ESRD on HD MWF -Continue HD per nephrology -Due to ongoing hyperkalemia, now on Lokelma daily; patient non compliant on diet; will adjust as needed  Right third toe necrosis Was seen by podiatry at [facility] recommended surgical intervention, but family opted for conservative management with IV antibiotics  Physical deconditioning - Due to prolonged hospitalization especially recently at [facility] - Continue working with therapy as able - see capacity discussion above; continue seeking SNF beds for now   Type 1 DM insulin dependent.  Pt non compliant to diet  - continue semglee - continue SSI and CBG monitoring    Gastroparesis  Stable.    Left  internal jugular DVT continue eliquis.    Subarachnoid hemorrhage while on Heparin.  Monitor.     Prostate fluid collection.  Dilated Seminal vesicle and no surgical intervention needed.  Recommend outpatient follow up with urology on discharge.    Anemia of chronic disease Hemoglobin around 8 and stable No obvious signs of bleeding     Estimated body mass index is 26.24 kg/m as calculated from the following:   Height as of this encounter: 5\' 7"  (1.702 m).   Weight as of this encounter: 76 kg.   Old records reviewed in assessment of this patient   DVT prophylaxis:   apixaban (ELIQUIS) tablet 5 mg   Code Status:   Code Status: Full Code  Mobility Assessment (Last 72 Hours)  Mobility Assessment     Row Name 09/20/23 1456 09/20/23 1134 09/19/23 2100 09/19/23 0926 09/18/23 2100   Does patient have an order for bedrest or is patient medically unstable -- No - Continue assessment No - Continue assessment No - Continue assessment No - Continue assessment   What is the highest level of mobility based on the progressive mobility assessment?  Level 2 (Chairfast) - Balance while sitting on edge of bed and cannot stand Level 2 (Chairfast) - Balance while sitting on edge of bed and cannot stand Level 2 (Chairfast) - Balance while sitting on edge of bed and cannot stand Level 2 (Chairfast) - Balance while sitting on edge of bed and cannot stand Level 2 (Chairfast) - Balance while sitting on edge of bed and cannot stand   Is the above level different from baseline mobility prior to current illness? -- Yes - Recommend PT order Yes - Recommend PT order Yes - Recommend PT order Yes - Recommend PT order    Row Name 09/18/23 1000 09/17/23 2055         Does patient have an order for bedrest or is patient medically unstable No - Continue assessment No - Continue assessment      What is the highest level of mobility based on the progressive mobility assessment? Level 2 (Chairfast) - Balance while sitting on edge of bed and cannot stand Level 2 (Chairfast) - Balance while sitting on edge of bed and cannot stand      Is the above level different from baseline mobility prior to current illness? Yes - Recommend PT order --               Barriers to discharge: none Disposition Plan:  U/K Status is: Inpt  Objective: Blood pressure (!) 110/41, pulse 86, temperature 98.6 F (37 C), temperature source Oral, resp. rate 18, height 5\' 7"  (1.702 m), weight 82.4 kg, SpO2 90%.  Examination:  Physical Exam Constitutional:      General: He is not in acute distress.    Appearance: Normal appearance.     Comments: Disheveled appearance. Curses a lot  HENT:     Head: Normocephalic and atraumatic.     Comments: Right scalp incision noted with 2 sutures on opposite ends and healing incision; now with dried blood in place and dressing    Mouth/Throat:     Mouth: Mucous membranes are moist.  Eyes:     Extraocular Movements: Extraocular movements intact.  Cardiovascular:     Rate and Rhythm: Normal rate and regular rhythm.  Pulmonary:     Effort:  Pulmonary effort is normal. No respiratory distress.     Breath sounds: Normal breath sounds. No wheezing.  Abdominal:     General: Bowel sounds are normal. There is no distension.     Palpations: Abdomen is soft.     Tenderness: There is no abdominal tenderness.  Musculoskeletal:     Cervical back: Normal range of motion and neck supple.     Comments: Left AKA noted  Skin:    General: Skin is warm and dry.  Neurological:     Mental Status: He is alert.     Comments: 0/5 strength in LUE  Psychiatric:        Attention and Perception: He is inattentive.        Mood and Affect: Mood is anxious.        Speech: Speech is rapid and pressured.        Behavior:  Behavior is hyperactive.      Consultants:  Nephrology   Procedures:    Data Reviewed: Results for orders placed or performed during the hospital encounter of 09/09/23 (from the past 24 hours)  Renal function panel     Status: Abnormal   Collection Time: 09/19/23  8:30 PM  Result Value Ref Range   Sodium 135 135 - 145 mmol/L   Potassium 4.9 3.5 - 5.1 mmol/L   Chloride 97 (L) 98 - 111 mmol/L   CO2 25 22 - 32 mmol/L   Glucose, Bld 365 (H) 70 - 99 mg/dL   BUN 29 (H) 6 - 20 mg/dL   Creatinine, Ser 9.60 (H) 0.61 - 1.24 mg/dL   Calcium 9.1 8.9 - 45.4 mg/dL   Phosphorus 5.7 (H) 2.5 - 4.6 mg/dL   Albumin 2.7 (L) 3.5 - 5.0 g/dL   GFR, Estimated 18 (L) >60 mL/min   Anion gap 13 5 - 15  Magnesium     Status: None   Collection Time: 09/19/23  8:30 PM  Result Value Ref Range   Magnesium 1.8 1.7 - 2.4 mg/dL  CBC with Differential/Platelet     Status: Abnormal   Collection Time: 09/19/23  8:30 PM  Result Value Ref Range   WBC 6.3 4.0 - 10.5 K/uL   RBC 2.94 (L) 4.22 - 5.81 MIL/uL   Hemoglobin 8.5 (L) 13.0 - 17.0 g/dL   HCT 09.8 (L) 11.9 - 14.7 %   MCV 94.2 80.0 - 100.0 fL   MCH 28.9 26.0 - 34.0 pg   MCHC 30.7 30.0 - 36.0 g/dL   RDW 82.9 56.2 - 13.0 %   Platelets 187 150 - 400 K/uL   nRBC 0.0 0.0 - 0.2 %   Neutrophils  Relative % 72 %   Neutro Abs 4.6 1.7 - 7.7 K/uL   Lymphocytes Relative 17 %   Lymphs Abs 1.1 0.7 - 4.0 K/uL   Monocytes Relative 6 %   Monocytes Absolute 0.4 0.1 - 1.0 K/uL   Eosinophils Relative 3 %   Eosinophils Absolute 0.2 0.0 - 0.5 K/uL   Basophils Relative 1 %   Basophils Absolute 0.0 0.0 - 0.1 K/uL   Immature Granulocytes 1 %   Abs Immature Granulocytes 0.03 0.00 - 0.07 K/uL  Glucose, capillary     Status: Abnormal   Collection Time: 09/19/23 10:19 PM  Result Value Ref Range   Glucose-Capillary 436 (H) 70 - 99 mg/dL  Glucose, capillary     Status: Abnormal   Collection Time: 09/20/23  8:19 AM  Result Value Ref Range   Glucose-Capillary 165 (H) 70 - 99 mg/dL  Glucose, capillary     Status: Abnormal   Collection Time: 09/20/23 12:21 PM  Result Value Ref Range   Glucose-Capillary 115 (H) 70 - 99 mg/dL  Renal function panel     Status: Abnormal   Collection Time: 09/20/23 12:58 PM  Result Value Ref Range   Sodium 134 (L) 135 - 145 mmol/L   Potassium 5.3 (H) 3.5 - 5.1 mmol/L   Chloride 95 (L) 98 - 111 mmol/L   CO2 27 22 - 32 mmol/L   Glucose, Bld 55 (L) 70 - 99 mg/dL   BUN 37 (H) 6 - 20 mg/dL   Creatinine, Ser 8.65 (H) 0.61 - 1.24 mg/dL   Calcium 9.1 8.9 - 78.4 mg/dL   Phosphorus 6.8 (H) 2.5 - 4.6 mg/dL   Albumin 2.6 (L) 3.5 - 5.0 g/dL   GFR, Estimated 14 (L) >  60 mL/min   Anion gap 12 5 - 15  Magnesium     Status: None   Collection Time: 09/20/23 12:58 PM  Result Value Ref Range   Magnesium 1.8 1.7 - 2.4 mg/dL  CBC with Differential/Platelet     Status: Abnormal   Collection Time: 09/20/23 12:58 PM  Result Value Ref Range   WBC 7.3 4.0 - 10.5 K/uL   RBC 2.88 (L) 4.22 - 5.81 MIL/uL   Hemoglobin 8.3 (L) 13.0 - 17.0 g/dL   HCT 16.1 (L) 09.6 - 04.5 %   MCV 94.1 80.0 - 100.0 fL   MCH 28.8 26.0 - 34.0 pg   MCHC 30.6 30.0 - 36.0 g/dL   RDW 40.9 81.1 - 91.4 %   Platelets 203 150 - 400 K/uL   nRBC 0.0 0.0 - 0.2 %   Neutrophils Relative % 69 %   Neutro Abs 5.1 1.7 -  7.7 K/uL   Lymphocytes Relative 16 %   Lymphs Abs 1.2 0.7 - 4.0 K/uL   Monocytes Relative 10 %   Monocytes Absolute 0.7 0.1 - 1.0 K/uL   Eosinophils Relative 4 %   Eosinophils Absolute 0.3 0.0 - 0.5 K/uL   Basophils Relative 1 %   Basophils Absolute 0.0 0.0 - 0.1 K/uL   Immature Granulocytes 0 %   Abs Immature Granulocytes 0.01 0.00 - 0.07 K/uL    I have reviewed pertinent nursing notes, vitals, labs, and images as necessary. I have ordered labwork to follow up on as indicated.  I have reviewed the last notes from staff over past 24 hours. I have discussed patient's care plan and test results with nursing staff, CM/SW, and other staff as appropriate.    LOS: 11 days   Lewie Chamber, MD [facility] 09/20/2023, 3:47 PM

## 2023-09-20 NOTE — Plan of Care (Signed)
  Problem: Education: Goal: Knowledge of General Education information will improve Description: Including pain rating scale, medication(s)/side effects and non-pharmacologic comfort measures Outcome: Progressing   Problem: Activity: Goal: Risk for activity intolerance will decrease Outcome: Progressing   Problem: Nutrition: Goal: Adequate nutrition will be maintained Outcome: Progressing   Problem: Pain Management: Goal: General experience of comfort will improve Outcome: Progressing   Problem: Safety: Goal: Ability to remain free from injury will improve Outcome: Progressing   Problem: Education: Goal: Ability to describe self-care measures that may prevent or decrease complications (Diabetes Survival Skills Education) will improve Outcome: Progressing Goal: Individualized Educational Video(s) Outcome: Progressing   Problem: Fluid Volume: Goal: Ability to maintain a balanced intake and output will improve Outcome: Progressing

## 2023-09-20 NOTE — Progress Notes (Signed)
Physical Therapy Treatment Patient Details Name: [name] [name]: 09/20/2023   History of Present Illness 42 y.o. male presents to [facility] 09/09/23 as a transfer from [facility] for further management of endocarditis. Pt originally was admitted to [facility] 07/09/23 for DKA and sepsis after being found on the ground. 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 10/17 pt had a R MCA, L occipital, and B cerebellum CVA w/ septic emboli. ICU admit 10/18 w/ intubation 10/18-10/24. Pt was transferred to [facility] for MV repair on 11/29. At [facility], pt had cardiac cath w/ non obstructive disease, SAH, a craniotomy w/ abscess drainage on 12/4, and R third toe distal necrosis. PMH: chronic L AKA, MRSA, IDDM, ESRD on HD MWF, HTN, TEE positive for vegetation on multiple valves, cellulitis, medical noncompliance, narcotic dependence, DM with gastroparesis, L internal jugular DVT.    PT Comments  Pt emotionally labile upon entry; provided listening and emotional support. Worked on [employer] transfers from bed to chair with two person assist. Continues with weakness, impaired cognition, decreased sitting balance, activity tolerance. Patient will benefit from continued inpatient follow up therapy, <3 hours/day.    If plan is discharge home, recommend the following: Two people to help with walking and/or transfers;Assist for transportation;Supervision due to cognitive status;Help with stairs or ramp for entrance;Two people to help with bathing/dressing/bathroom;Assistance with cooking/housework;Direct supervision/assist for financial management;Direct supervision/assist for medications management   Can travel by private vehicle     No  Equipment Recommendations  Hoyer lift;Wheelchair (measurements PT);Wheelchair cushion (measurements PT);Other (comment) (Roho cushion)    Recommendations for Other Services       Precautions / Restrictions  Precautions Precautions: Fall Precaution Comments: L AKA, L hemiparesis, L neglect, bowel incontinence Restrictions Weight Bearing Restrictions Per Provider Order: Yes LLE Weight Bearing Per Provider Order: Weight bearing as tolerated     Mobility  Bed Mobility Overal bed mobility: Needs Assistance Bed Mobility: Supine to Sit     Supine to sit: +2 for physical assistance, Max assist     General bed mobility comments: Decreased initiation by pt, cues for use of bed rail with RUE    Transfers Overall transfer level: Needs assistance Equipment used: Sliding board Transfers: Bed to chair/wheelchair/BSC       Squat pivot transfers: Total assist, +2 physical assistance     General transfer comment: Slide board transfer +2 to right, cues for R hand placement on board and head/hip relationship    Ambulation/Gait                   Stairs             Wheelchair Mobility     Tilt Bed    Modified Rankin (Stroke Patients Only)       Balance Overall balance assessment: Needs assistance Sitting-balance support: Single extremity supported, Feet supported Sitting balance-[name]: Fair                                      Cognition Arousal: Alert Behavior During Therapy: WFL for tasks assessed/performed Overall Cognitive Status: Impaired/Different from baseline Area of Impairment: Attention, Memory, Following commands, Safety/judgement, Awareness, Problem solving                   Current Attention Level: Focused Memory: Decreased short-term memory Following Commands: Follows one step commands with  increased time Safety/Judgement: Decreased awareness of safety, Decreased awareness of deficits Awareness: Intellectual Problem Solving: Slow processing, Difficulty sequencing, Requires verbal cues, Requires tactile cues General Comments: Verbose with poor attention        Exercises      General Comments        Pertinent  Vitals/Pain Pain Assessment Pain Assessment: Faces Faces Pain Scale: No hurt    Home Living                          Prior Function            PT Goals (current goals can now be found in the care plan section) Acute Rehab PT Goals Potential to Achieve Goals: Fair Progress towards PT goals: Progressing toward goals    Frequency    Min 1X/week      PT Plan      Co-evaluation              AM-PAC PT "6 Clicks" Mobility   Outcome Measure  Help needed turning from your back to your side while in a flat bed without using bedrails?: A Lot Help needed moving from lying on your back to sitting on the side of a flat bed without using bedrails?: Total Help needed moving to and from a bed to a chair (including a wheelchair)?: Total Help needed standing up from a chair using your arms (e.g., wheelchair or bedside chair)?: Total Help needed to walk in hospital room?: Total Help needed climbing 3-5 steps with a railing? : Total 6 Click Score: 7    End of Session Equipment Utilized During Treatment: Gait belt Activity Tolerance: Patient tolerated treatment well Patient left: in chair;with call bell/phone within reach;with chair alarm set Nurse Communication: Mobility status;Need for lift equipment PT Visit Diagnosis: Other abnormalities of gait and mobility (R26.89);Other symptoms and signs involving the nervous system (R29.898);Hemiplegia and hemiparesis Hemiplegia - Right/Left: Left Hemiplegia - dominant/non-dominant: Non-dominant Hemiplegia - caused by: Cerebral infarction     Time: 4034-7425 PT Time Calculation (min) (ACUTE ONLY): 42 min  Charges:    $Therapeutic Activity: 38-52 mins PT General Charges $$ ACUTE PT VISIT: 1 Visit                     Lillia Pauls, PT, DPT Acute Rehabilitation Services Office [phone]    [name] 09/20/2023, 3:03 PM

## 2023-09-20 NOTE — TOC Progression Note (Deleted)
Transition of Care [facility]) - Progression Note    Patient Details  Name: [name] MRN: [id number] Date of Birth: October 16, 1980  Transition of Care [facility]) CM/SW Contact  Carley Hammed, LCSW Phone Number: 09/20/2023, 10:00 AM  Clinical Narrative:     DSS family meeting canceled for today as pt and spouse are covid + will continue to address concerns and needs. TOC will continue to follow.       Expected Discharge Plan and Services                                               Social Determinants of Health (SDOH) Interventions SDOH Screenings   Food Insecurity: No Food Insecurity (09/09/2023)  Housing: Unknown (09/09/2023)  Transportation Needs: No Transportation Needs (09/09/2023)  Utilities: Not At Risk (09/09/2023)  Alcohol Screen: Low Risk  (02/15/2023)  Depression (PHQ2-9): Low Risk  (02/15/2023)  Financial Resource Strain: Low Risk  (02/15/2023)  Physical Activity: Inactive (02/15/2023)  Social Connections: Socially Integrated (02/15/2023)  Stress: No Stress Concern Present (02/15/2023)  Tobacco Use: Low Risk  (09/12/2023)  Recent Concern: Tobacco Use - Medium Risk (09/01/2023)   Received from [facility]    Readmission Risk Interventions     No data to display

## 2023-09-20 NOTE — Progress Notes (Signed)
   09/20/23 2108  Assess: MEWS Score  Temp 99.9 F (37.7 C)  BP (!) 89/62  MAP (mmHg) 71  Pulse Rate (!) 124  Resp 18  Level of Consciousness (Procedural Areas Only) Assessment interferes with procedure  SpO2 100 %  O2 Device Room Air  Patient Activity (if Appropriate) In bed  Assess: MEWS Score  MEWS Temp 0  MEWS Systolic 1  MEWS Pulse 2  MEWS RR 0  MEWS LOC 0  MEWS Score 3  MEWS Score Color Yellow  Assess: if the MEWS score is Yellow or Red  Were vital signs accurate and taken at a resting state? Yes  Does the patient meet 2 or more of the SIRS criteria? Yes  Does the patient have a confirmed or suspected source of infection? No  MEWS guidelines implemented  Yes, yellow  Treat  MEWS Interventions Considered administering scheduled or prn medications/treatments as ordered  Take Vital Signs  Increase Vital Sign Frequency  Yellow: Q2hr x1, continue Q4hrs until patient remains green for 12hrs  Escalate  MEWS: Escalate Yellow: Discuss with charge nurse and consider notifying provider and/or RRT  Notify: Charge Nurse/RN  Name of Charge Nurse/RN Notified Bev, RN  Provider Notification  Provider Name/Title Chinita Greenland, NP  Date Provider Notified 09/20/23  Time Provider Notified 2110  Method of Notification Page  Notification Reason Other (Comment) (Yellow MEWS BP 89/62)  Provider response See new orders  Date of Provider Response 09/20/23  Time of Provider Response 2223  Assess: SIRS CRITERIA  SIRS Temperature  0  SIRS Respirations  0  SIRS Pulse 1  SIRS WBC 0  SIRS Score Sum  1

## 2023-09-20 NOTE — Progress Notes (Signed)
   09/20/23 1400  Mobility  Activity Transferred from chair to bed  Level of Assistance Dependent, patient does less than 25%  Assistive Device Sliding board  LLE Weight Bearing Per Provider Order WBAT  Activity Response Tolerated fair  Mobility visit 1 Mobility  Mobility Specialist Start Time (ACUTE ONLY) 1230  Mobility Specialist Stop Time (ACUTE ONLY) 1257  Mobility Specialist Time Calculation (min) (ACUTE ONLY) 27 min   Mobility Specialist: Progress Note Pt agreeable to mobility session - received in bed. Required Dependent Assist +3 using sliding board. C/o LLE pain throughout. Returned to bed with all needs met - call bell within reach.   Barnie Mort, BS Mobility Specialist Please contact via SecureChat or Rehab office at [phone].

## 2023-09-21 DIAGNOSIS — I38 Endocarditis, valve unspecified: Secondary | ICD-10-CM | POA: Diagnosis not present

## 2023-09-21 DIAGNOSIS — I120 Hypertensive chronic kidney disease with stage 5 chronic kidney disease or end stage renal disease: Secondary | ICD-10-CM | POA: Diagnosis not present

## 2023-09-21 DIAGNOSIS — I330 Acute and subacute infective endocarditis: Secondary | ICD-10-CM | POA: Diagnosis not present

## 2023-09-21 DIAGNOSIS — Z992 Dependence on renal dialysis: Secondary | ICD-10-CM | POA: Diagnosis not present

## 2023-09-21 DIAGNOSIS — N186 End stage renal disease: Secondary | ICD-10-CM | POA: Diagnosis not present

## 2023-09-21 DIAGNOSIS — B9562 Methicillin resistant Staphylococcus aureus infection as the cause of diseases classified elsewhere: Secondary | ICD-10-CM | POA: Diagnosis not present

## 2023-09-21 DIAGNOSIS — D631 Anemia in chronic kidney disease: Secondary | ICD-10-CM | POA: Diagnosis not present

## 2023-09-21 DIAGNOSIS — R7881 Bacteremia: Secondary | ICD-10-CM | POA: Diagnosis not present

## 2023-09-21 DIAGNOSIS — N250 Renal osteodystrophy: Secondary | ICD-10-CM | POA: Diagnosis not present

## 2023-09-21 DIAGNOSIS — R4189 Other symptoms and signs involving cognitive functions and awareness: Secondary | ICD-10-CM | POA: Diagnosis not present

## 2023-09-21 LAB — RENAL FUNCTION PANEL
Albumin: 2.6 g/dL — ABNORMAL LOW (ref 3.5–5.0)
Anion gap: 12 (ref 5–15)
BUN: 47 mg/dL — ABNORMAL HIGH (ref 6–20)
CO2: 25 mmol/L (ref 22–32)
Calcium: 9.5 mg/dL (ref 8.9–10.3)
Chloride: 97 mmol/L — ABNORMAL LOW (ref 98–111)
Creatinine, Ser: 5.8 mg/dL — ABNORMAL HIGH (ref 0.61–1.24)
GFR, Estimated: 12 mL/min — ABNORMAL LOW (ref >60)
Glucose, Bld: 223 mg/dL — ABNORMAL HIGH (ref 70–99)
Phosphorus: 8.5 mg/dL — ABNORMAL HIGH (ref 2.5–4.6)
Potassium: 6.3 mmol/L (ref 3.5–5.1)
Sodium: 134 mmol/L — ABNORMAL LOW (ref 135–145)

## 2023-09-21 LAB — CBC WITH DIFFERENTIAL/PLATELET
Abs Immature Granulocytes: 0.02 10*3/uL (ref 0.00–0.07)
Basophils Absolute: 0.1 10*3/uL (ref 0.0–0.1)
Basophils Relative: 1 %
Eosinophils Absolute: 0.3 10*3/uL (ref 0.0–0.5)
Eosinophils Relative: 4 %
HCT: 29.3 % — ABNORMAL LOW (ref 39.0–52.0)
Hemoglobin: 9.1 g/dL — ABNORMAL LOW (ref 13.0–17.0)
Immature Granulocytes: 0 %
Lymphocytes Relative: 21 %
Lymphs Abs: 1.3 10*3/uL (ref 0.7–4.0)
MCH: 28.8 pg (ref 26.0–34.0)
MCHC: 31.1 g/dL (ref 30.0–36.0)
MCV: 92.7 fL (ref 80.0–100.0)
Monocytes Absolute: 0.6 10*3/uL (ref 0.1–1.0)
Monocytes Relative: 9 %
Neutro Abs: 4.2 10*3/uL (ref 1.7–7.7)
Neutrophils Relative %: 65 %
Platelets: 189 10*3/uL (ref 150–400)
RBC: 3.16 MIL/uL — ABNORMAL LOW (ref 4.22–5.81)
RDW: 14.7 % (ref 11.5–15.5)
WBC: 6.4 10*3/uL (ref 4.0–10.5)
nRBC: 0 % (ref 0.0–0.2)

## 2023-09-21 LAB — GLUCOSE, CAPILLARY
Glucose-Capillary: 231 mg/dL — ABNORMAL HIGH (ref 70–99)
Glucose-Capillary: 257 mg/dL — ABNORMAL HIGH (ref 70–99)
Glucose-Capillary: 122 mg/dL — ABNORMAL HIGH (ref 70–99)
Glucose-Capillary: 227 mg/dL — ABNORMAL HIGH (ref 70–99)

## 2023-09-21 LAB — VANCOMYCIN, RANDOM: Vancomycin Rm: 27 ug/mL

## 2023-09-21 LAB — MAGNESIUM: Magnesium: 2 mg/dL (ref 1.7–2.4)

## 2023-09-21 MED ORDER — VANCOMYCIN HCL 500 MG/100ML IV SOLN
500.0000 mg | INTRAVENOUS | Status: DC
  Administered 2023-09-24 – 2023-10-11 (×8): 500 mg via INTRAVENOUS
  Filled 2023-09-21 (×15): qty 100

## 2023-09-21 NOTE — Progress Notes (Signed)
[facility] Progress Note   OP HD: [facility] MWF  4h   400/1.5  77kg  L fem TDC 2/3 bath  Heparin none   Assessment/ Plan: 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 Not a candidate for surgery here. SP line holiday w/ new TDC placed 10/18. Continues on IV vanc upon return from [place] . Adjusted plan per ID pharm is 750mg  MWF through 10/27/23.  Hyperkalemia - K+ 5.3 today after being started on daily Lokelma. Patient refuses renal diet. Follow labs.  SP I&D brain abscess: done 12/04 at [facility] much better.  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 Next HD today on holiday schedule and will dialyze with a 1K bath for the first hour.  .  Volume: on RA, L arm + edema, no other edema. BP's dropping here lately. Lowered UF to 2 L today.  On for dialysis today. Anemia: Hb 8.3. ESA dose increased to 100 mcg sq weekly. Will resume here. Hold iron infusion while on IV ABX. 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 Cont binders w/ meals.  Hypertension: BP's controlled, cont metoprolol 25 bid. BP's soft lately. May need to make adjustments this week.  HD access:  L  femoral TDC placed on 10/18. 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 Dispo - could not dialyze well in the chair 12/18 today, pt required the chair be in a supine position or he would have slid out of the chair. Continue to attempt to have HD in recliner  Subjective: Seen in room, arguing with RN.  Denies fever chills shortness of breath chest pain; complaining of left arm pain and wants pain medications.     Objective Vitals:   09/21/23 0421 09/21/23 0722 09/21/23 0730 09/21/23 0821  BP: 116/74 (!) 89/73 97/86   Pulse: (!) 124 (!) 126 (!) 125   Resp: 20 20 18 20   Temp: 98.4 F (36.9 C) 99.2 F (37.3 C) 98.5 F (36.9 C) 99 F (37.2 C)  TempSrc: Oral Axillary Oral Oral  SpO2: 99% 98% 98%   Weight:       Height:       Physical Exam General: Chronically ill appearing male in NAD Heart:S1,S2 RRR No M/R/G Lungs: CTAB no WOB Abdomen: NABS, NT Extremities: L AKA No RLE edema. Edema LUE Dialysis Access: L femoral TDC drsg intact  Dialysis Orders:  Additional Objective Labs: Basic Metabolic Panel: Recent Labs  Lab 09/19/23 2030 09/20/23 1258 09/21/23 0620  NA 135 134* 134*  K 4.9 5.3* 6.3*  CL 97* 95* 97*  CO2 25 27 25   GLUCOSE 365* 55* 223*  BUN 29* 37* 47*  CREATININE 4.02* 4.89* 5.80*  CALCIUM 9.1 9.1 9.5  PHOS 5.7* 6.8* 8.5*   Liver Function Tests: Recent Labs  Lab 09/19/23 2030 09/20/23 1258 09/21/23 0620  ALBUMIN 2.7* 2.6* 2.6*   No results for input(s): "LIPASE", "AMYLASE" in the last 168 hours. CBC: Recent Labs  Lab 09/15/23 0705 09/15/23 0705 09/17/23 0756 09/19/23 2030 09/20/23 1258 09/21/23 0620  WBC 5.5  --  5.9 6.3 7.3 6.4  NEUTROABS  --    < > 4.0 4.6 5.1 4.2  HGB 9.0*  --  9.8* 8.5* 8.3* 9.1*  HCT 29.3*  --  31.3* 27.7* 27.1* 29.3*  MCV 94.8  --  92.9 94.2 94.1 92.7  PLT 176  --  196 187 203 189   < > =  values in this interval not displayed.   Blood Culture    Component Value Date/Time   SDES BLOOD SITE NOT SPECIFIED 08/25/2023 1706   SPECREQUEST  08/25/2023 1706    BOTTLES DRAWN AEROBIC ONLY Blood Culture results may not be optimal due to an inadequate volume of blood received in culture bottles   CULT  08/25/2023 1706    NO GROWTH 5 DAYS Performed at [facility], [address] [place], [name] [address]    REPTSTATUS 08/30/2023 FINAL 08/25/2023 1706    Cardiac Enzymes: No results for input(s): "CKTOTAL", "CKMB", "CKMBINDEX", "TROPONINI" in the last 168 hours. CBG: Recent Labs  Lab 09/20/23 0819 09/20/23 1221 09/20/23 1750 09/20/23 2121 09/21/23 0820  GLUCAP 165* 115* 136* 208* 231*   Iron Studies: No results for input(s): "IRON", "TIBC", "TRANSFERRIN", "FERRITIN" in the last 72  hours. @lablastinr3 @ Studies/Results: No results found. Medications:  heparin sodium (porcine)     vancomycin      apixaban  5 mg Oral BID   bacitracin   Topical Daily   calcitRIOL  1.5 mcg Oral Q M,W,F-HD   Chlorhexidine Gluconate Cloth  6 each Topical Q0600   Chlorhexidine Gluconate Cloth  6 each Topical Q0600   darbepoetin (ARANESP) injection - DIALYSIS  100 mcg Subcutaneous Q Sun-1800   gabapentin  100 mg Oral TID   insulin aspart  0-6 Units Subcutaneous TID AC & HS   insulin glargine-yfgn  4 Units Subcutaneous Daily   loratadine  10 mg Oral Daily   metoprolol tartrate  25 mg Oral BID   pantoprazole  40 mg Oral Daily   povidone-Iodine   Topical Q0600   QUEtiapine  12.5 mg Oral q morning   QUEtiapine  25 mg Oral QHS   senna-docusate  2 tablet Oral BID   sevelamer carbonate  800 mg Oral TID WC   sodium zirconium cyclosilicate  10 g Oral Daily

## 2023-09-21 NOTE — Progress Notes (Signed)
Pharmacy Antibiotic Note  [name] is a 42 y.o. 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 Pt was transferred to [facility] for MV repair 11/29 and craniotomy with abscess drainage on 12/4. Pharmacy has been consulted for continued Vancomycin dosing.  Spoke with pharmacist at [facility], vancomycin was being dose based on pre-HD levels - pt mostly getting 750mg  with HD but occasionally would get 500mg  instead. Last vanc random (pre-HD level) at [facility] of 19.8 mcg/ml on 12/11 (therapeutic for goal 15-25 mcg/ml) and pt received vanc 750mg  IV 12/11 pm post HD  12/24 AM update: - random level 27 mcg/mL on 12/24  Plan: - Omit vanco dose 12/24 f/b Change Vancomycin 500 mg IV post HD MWF through 10/27/23 - Will monitor HD tolerance and dosing - F/u ID recommendations  Height: 5\' 7"  (170.2 cm) Weight: 82.4 kg (181 lb 10.5 oz) IBW/kg (Calculated) : 66.1  Temp (24hrs), Avg:98.9 F (37.2 C), Min:98.4 F (36.9 C), Max:99.9 F (37.7 C)  Recent Labs  Lab 09/15/23 0705 09/16/23 1121 09/17/23 0756 09/17/23 0757 09/19/23 1035 09/19/23 2030 09/20/23 1258 09/21/23 0620 09/21/23 1020  WBC 5.5  --  5.9  --   --  6.3 7.3 6.4  --   CREATININE 6.23*   < >  --  5.56* 5.87* 4.02* 4.89* 5.80*  --   VANCORANDOM  --   --   --   --   --   --   --   --  27   < > = values in this interval not displayed.    Estimated Creatinine Clearance: 16.9 mL/min (A) (by C-G formula based on SCr of 5.8 mg/dL (H)).    No Known Allergies  Antimicrobials this admission: Vancomycin 10/12 >>(10/27/23) Cefepime 10/21 >> 10/23; restart 10/25 >> 10/29 Linezolid 11/27 >> 12/10   Vancomycin levels  -10/17 preHD>>25 -10/24: VR= 16 -11/1: VR= 24 -11/8 VR 22 -11/15 VR 22  -11/22 VR 28 - held dose x1, decrease to 500mg  -11/28 VR 18 - cont 500mg  MWF 11/29 - Transferred to [facility] -12/11 VR 19.8 (at [facility]) - mostly 750mg  with HD (occasionally 500mg  instead) - 12/16 VR 25  mcg/mL- continue 750 mg iv MWF - 12/24 VR 27 mcg/mL- hold 1 dose / reduce to 500 mg iv MWF thereafter   Microbiology results: 10/12 MRSA PCR >> positive 10/12 CDiff antigen +, toxin -, PCR - >> negative 10/12 BCx >> 4/4 MRSA 10/13 BCx >> Negative 10/17 BCx >> Negative 10/21 RCx >> Negative 11/26 Bcx >> negative 11/27 Bcx >> Negative   [name] BS, PharmD, BCPS Clinical Pharmacist 09/21/2023 12:42 PM  Contact: [phone] after 3 PM  "Be curious, not judgmental..." -Debbora Dus

## 2023-09-21 NOTE — Plan of Care (Signed)
  Problem: Nutrition: Goal: Adequate nutrition will be maintained Outcome: Progressing   Problem: Elimination: Goal: Will not experience complications related to bowel motility Outcome: Progressing   Problem: Pain Management: Goal: General experience of comfort will improve Outcome: Progressing   Problem: Safety: Goal: Ability to remain free from injury will improve Outcome: Progressing

## 2023-09-21 NOTE — Progress Notes (Signed)
Progress Note    ZAHIR MOMINEE   ZOX:[id number]  DOB: 12-14-1980  DOA: 09/09/2023     12 PCP: [name], NP  Initial CC: endocarditis  Hospital Course: NATHEN WEITZNER is a 42 y.o. male with medical history significant of ESRD on hemodialysis (not fully compliant), insulin-dependent type 1 diabetes, gastroparesis, Left AKA, admitted to [facility] on 07/09/23 for DKA, Sepsis and hospital course significant for MV AND TV  MRSA endocarditis, followed by acute MCA stroke , left occipital and bilateral cerebellar strokes consistent with septic emboli,  Brain abscess from MRSA, left IJ DVT was transferred to [facility] for MV repair on 11/29.  While at [facility] he underwent cardiac cath showing non obstructive disease, craniotomy with abscess drainage on 09/01/23,. He had subarachnoid hemorrhage while on IV heparin, right third toe distal necrosis,was seen by podiatry, but family wanted conservative management.  Since his tertiary care needs have been completed, he was transferred back to [facility] for further management.  Therapy eval recommending SNF.  Interval History:  No issues overnight. Some left arm pain this morning; instructed him to use tylenol and oxy for it which he was okay with.  Potassium high again this morning; but on daily Lokelma.   Assessment and Plan:  MRSA MV /TV endocarditis Brain abscess s/p craniotomy and abscess drainage on 12/4 Complete 8 week treatment with IV vancomycin. He gets IV vancomycin with HD.  ID signed off.  EOT is 1/29. 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  ESRD on HD MWF -Continue HD per nephrology -Due to ongoing hyperkalemia, now on Lokelma daily; patient non compliant on diet; will adjust as needed  Right third toe necrosis Was seen by podiatry at [facility] recommended surgical intervention, but family opted for conservative management with IV antibiotics  Physical deconditioning - Due to prolonged hospitalization especially recently at [facility] - Continue working with therapy as able - see capacity discussion above; continue seeking SNF beds for now   Type 1 DM insulin dependent - Pt non compliant to diet  - continue semglee; fragile diabetic; sensitive to big adjustments in regimen  - continue SSI and CBG monitoring    Gastroparesis  Stable.    Left  internal jugular DVT continue eliquis.    Subarachnoid hemorrhage while on Heparin.  Monitor.     Prostate fluid collection.  Dilated Seminal vesicle and no surgical intervention needed.  Recommend outpatient follow up with urology on discharge.    Anemia of chronic disease Hemoglobin around 8 and stable No obvious signs of bleeding     Estimated body mass index is 26.24 kg/m as calculated from the following:   Height as of this encounter: 5\' 7"  (1.702 m).   Weight as of this encounter: 76 kg.   Old records reviewed in assessment of this patient   DVT prophylaxis:   apixaban (ELIQUIS) tablet 5 mg  Code Status:   Code Status: Full Code  Mobility Assessment (Last 72 Hours)     Mobility Assessment     Row Name 09/21/23 0900 09/20/23 2010 09/20/23 1456 09/20/23 1134 09/19/23 2100   Does patient have an order for bedrest or is patient medically unstable No - Continue assessment No - Continue assessment -- No - Continue assessment No -  Continue assessment   What is the highest level of mobility based on the progressive mobility assessment? Level 2 (Chairfast) - Balance while sitting on edge of bed and cannot stand Level 2 (Chairfast) - Balance while sitting on edge of bed and cannot stand Level 2 (Chairfast) - Balance while sitting on edge of bed and cannot stand Level 2 (Chairfast) - Balance while sitting on edge of bed and cannot stand Level 2 (Chairfast) - Balance while sitting on edge of bed and cannot stand   Is the above level different from baseline mobility prior to current illness? Yes - Recommend PT order Yes - Recommend PT order -- Yes - Recommend PT order Yes - Recommend PT order    Row Name 09/19/23 0926 09/18/23 2100         Does patient have an order for bedrest or is patient medically unstable No - Continue assessment No - Continue assessment      What is the highest level of mobility based on the progressive mobility assessment? Level 2 (Chairfast) - Balance while sitting on edge of bed and cannot stand Level 2 (Chairfast) - Balance while sitting on edge of bed and cannot stand      Is the above level different from baseline mobility prior to current illness? Yes - Recommend PT order Yes - Recommend PT order               Barriers to discharge: none Disposition Plan:  U/K Status is: Inpt  Objective: Blood pressure (!) 146/123, pulse (!) 127, temperature 97.9 F (36.6 C), temperature source Oral, resp. rate 18, height 5\' 7"  (1.702 m), weight 86.5 kg, SpO2 98%.  Examination:  Physical Exam Constitutional:      General: He is not in acute distress.    Appearance: Normal appearance.     Comments: Disheveled appearance. Curses a lot  HENT:     Head: Normocephalic and atraumatic.     Comments: Right scalp incision noted with 2 sutures on opposite ends and healing incision; now with dried blood in place and dressing    Mouth/Throat:     Mouth: Mucous membranes are moist.  Eyes:     Extraocular  Movements: Extraocular movements intact.  Cardiovascular:     Rate and Rhythm: Normal rate and regular rhythm.  Pulmonary:     Effort: Pulmonary effort is normal. No respiratory distress.     Breath sounds: Normal breath sounds. No wheezing.  Abdominal:     General: Bowel sounds are normal. There is no distension.     Palpations: Abdomen is soft.     Tenderness: There is no abdominal tenderness.  Musculoskeletal:     Cervical back: Normal range of motion and neck supple.     Comments: Left AKA noted  Skin:    General: Skin is warm and dry.  Neurological:     Mental Status: He is alert.     Comments: 0/5 strength in LUE  Psychiatric:        Attention and Perception: He is inattentive.        Mood and Affect: Mood  is anxious.        Speech: Speech is rapid and pressured.        Behavior: Behavior is hyperactive.      Consultants:  Nephrology   Procedures:    Data Reviewed: Results for orders placed or performed during the hospital encounter of 09/09/23 (from the past 24 hours)  Glucose, capillary     Status: Abnormal   Collection Time: 09/20/23  5:50 PM  Result Value Ref Range   Glucose-Capillary 136 (H) 70 - 99 mg/dL  Glucose, capillary     Status: Abnormal   Collection Time: 09/20/23  9:21 PM  Result Value Ref Range   Glucose-Capillary 208 (H) 70 - 99 mg/dL  Renal function panel     Status: Abnormal   Collection Time: 09/21/23  6:20 AM  Result Value Ref Range   Sodium 134 (L) 135 - 145 mmol/L   Potassium 6.3 (HH) 3.5 - 5.1 mmol/L   Chloride 97 (L) 98 - 111 mmol/L   CO2 25 22 - 32 mmol/L   Glucose, Bld 223 (H) 70 - 99 mg/dL   BUN 47 (H) 6 - 20 mg/dL   Creatinine, Ser 6.29 (H) 0.61 - 1.24 mg/dL   Calcium 9.5 8.9 - 52.8 mg/dL   Phosphorus 8.5 (H) 2.5 - 4.6 mg/dL   Albumin 2.6 (L) 3.5 - 5.0 g/dL   GFR, Estimated 12 (L) >60 mL/min   Anion gap 12 5 - 15  Magnesium     Status: None   Collection Time: 09/21/23  6:20 AM  Result Value Ref Range   Magnesium 2.0 1.7 -  2.4 mg/dL  CBC with Differential/Platelet     Status: Abnormal   Collection Time: 09/21/23  6:20 AM  Result Value Ref Range   WBC 6.4 4.0 - 10.5 K/uL   RBC 3.16 (L) 4.22 - 5.81 MIL/uL   Hemoglobin 9.1 (L) 13.0 - 17.0 g/dL   HCT 41.3 (L) 24.4 - 01.0 %   MCV 92.7 80.0 - 100.0 fL   MCH 28.8 26.0 - 34.0 pg   MCHC 31.1 30.0 - 36.0 g/dL   RDW 27.2 53.6 - 64.4 %   Platelets 189 150 - 400 K/uL   nRBC 0.0 0.0 - 0.2 %   Neutrophils Relative % 65 %   Neutro Abs 4.2 1.7 - 7.7 K/uL   Lymphocytes Relative 21 %   Lymphs Abs 1.3 0.7 - 4.0 K/uL   Monocytes Relative 9 %   Monocytes Absolute 0.6 0.1 - 1.0 K/uL   Eosinophils Relative 4 %   Eosinophils Absolute 0.3 0.0 - 0.5 K/uL   Basophils Relative 1 %   Basophils Absolute 0.1 0.0 - 0.1 K/uL   Immature Granulocytes 0 %   Abs Immature Granulocytes 0.02 0.00 - 0.07 K/uL  Glucose, capillary     Status: Abnormal   Collection Time: 09/21/23  8:20 AM  Result Value Ref Range   Glucose-Capillary 231 (H) 70 - 99 mg/dL  Vancomycin, random     Status: None   Collection Time: 09/21/23 10:20 AM  Result Value Ref Range   Vancomycin Rm 27 ug/mL  Glucose, capillary     Status: Abnormal   Collection Time: 09/21/23 12:11 PM  Result Value Ref Range   Glucose-Capillary 257 (H) 70 - 99 mg/dL    I have reviewed pertinent nursing notes, vitals, labs, and images as necessary. I have ordered labwork to follow up on as indicated.  I have reviewed the last notes from staff  over past 24 hours. I have discussed patient's care plan and test results with nursing staff, CM/SW, and other staff as appropriate.    LOS: 12 days   Lewie Chamber, MD [facility] 09/21/2023, 2:09 PM

## 2023-09-21 NOTE — Progress Notes (Addendum)
Received patient in bed to unit.  Alert and oriented.  Informed consent signed and in chart.   TX duration: 1 hr 59 mins  Patient tolerated well.  Transported back to the room  Alert, without acute distress.  Hand-off given to patient's nurse.   Access used: Yes Access issues: No  Total UF removed: 1200 Medication(s) given: See MAR Post HD VS: See Below Grid Post HD weight: 84.3 kg   [facility]  09/21/23 1600  Vitals  Temp 98.5 F (36.9 C)  Pulse Rate (!) 116  Resp 16  BP (!) 154/57  SpO2 97 %  O2 Device Room Air  During Treatment Monitoring  Blood Flow Rate (mL/min) 399 mL/min  Arterial Pressure (mmHg) -221.8 mmHg  Venous Pressure (mmHg) 214.54 mmHg  TMP (mmHg) 20.2 mmHg  Ultrafiltration Rate (mL/min) 909 mL/min  Dialysate Flow Rate (mL/min) 300 ml/min  Dialysate Potassium Concentration 2  Dialysate Calcium Concentration 2.5  Duration of HD Treatment -hour(s) 1.98 hour(s)  Cumulative Fluid Removed (mL) per Treatment  1174.45  HD Safety Checks Performed Yes  Intra-Hemodialysis Comments Tx completed (pt ended tx with 1 hour left , pt feels he needs to use bathroom and uncomfortable in dialysis)

## 2023-09-22 DIAGNOSIS — B9562 Methicillin resistant Staphylococcus aureus infection as the cause of diseases classified elsewhere: Secondary | ICD-10-CM | POA: Diagnosis not present

## 2023-09-22 DIAGNOSIS — I330 Acute and subacute infective endocarditis: Secondary | ICD-10-CM | POA: Diagnosis not present

## 2023-09-22 DIAGNOSIS — N250 Renal osteodystrophy: Secondary | ICD-10-CM | POA: Diagnosis not present

## 2023-09-22 DIAGNOSIS — R7881 Bacteremia: Secondary | ICD-10-CM | POA: Diagnosis not present

## 2023-09-22 DIAGNOSIS — D631 Anemia in chronic kidney disease: Secondary | ICD-10-CM | POA: Diagnosis not present

## 2023-09-22 DIAGNOSIS — Z992 Dependence on renal dialysis: Secondary | ICD-10-CM | POA: Diagnosis not present

## 2023-09-22 DIAGNOSIS — N186 End stage renal disease: Secondary | ICD-10-CM | POA: Diagnosis not present

## 2023-09-22 DIAGNOSIS — I38 Endocarditis, valve unspecified: Secondary | ICD-10-CM | POA: Diagnosis not present

## 2023-09-22 DIAGNOSIS — I120 Hypertensive chronic kidney disease with stage 5 chronic kidney disease or end stage renal disease: Secondary | ICD-10-CM | POA: Diagnosis not present

## 2023-09-22 LAB — RENAL FUNCTION PANEL
Albumin: 2.6 g/dL — ABNORMAL LOW (ref 3.5–5.0)
Anion gap: 14 (ref 5–15)
BUN: 41 mg/dL — ABNORMAL HIGH (ref 6–20)
CO2: 24 mmol/L (ref 22–32)
Calcium: 9 mg/dL (ref 8.9–10.3)
Chloride: 96 mmol/L — ABNORMAL LOW (ref 98–111)
Creatinine, Ser: 5.44 mg/dL — ABNORMAL HIGH (ref 0.61–1.24)
GFR, Estimated: 12 mL/min — ABNORMAL LOW (ref >60)
Glucose, Bld: 355 mg/dL — ABNORMAL HIGH (ref 70–99)
Phosphorus: 8.2 mg/dL — ABNORMAL HIGH (ref 2.5–4.6)
Potassium: 5.8 mmol/L — ABNORMAL HIGH (ref 3.5–5.1)
Sodium: 134 mmol/L — ABNORMAL LOW (ref 135–145)

## 2023-09-22 LAB — GLUCOSE, CAPILLARY
Glucose-Capillary: 464 mg/dL — ABNORMAL HIGH (ref 70–99)
Glucose-Capillary: 420 mg/dL — ABNORMAL HIGH (ref 70–99)
Glucose-Capillary: 315 mg/dL — ABNORMAL HIGH (ref 70–99)
Glucose-Capillary: 65 mg/dL — ABNORMAL LOW (ref 70–99)
Glucose-Capillary: 68 mg/dL — ABNORMAL LOW (ref 70–99)
Glucose-Capillary: 52 mg/dL — ABNORMAL LOW (ref 70–99)
Glucose-Capillary: 54 mg/dL — ABNORMAL LOW (ref 70–99)
Glucose-Capillary: 74 mg/dL (ref 70–99)

## 2023-09-22 LAB — MAGNESIUM: Magnesium: 1.8 mg/dL (ref 1.7–2.4)

## 2023-09-22 MED ORDER — DEXTROSE 50 % IV SOLN
INTRAVENOUS | Status: AC
  Filled 2023-09-22: qty 50

## 2023-09-22 MED ORDER — DEXTROSE 50 % IV SOLN
25.0000 mL | Freq: Once | INTRAVENOUS | Status: AC
  Administered 2023-09-22: 25 mL via INTRAVENOUS
  Filled 2023-09-22: qty 50

## 2023-09-22 MED ORDER — INSULIN ASPART 100 UNIT/ML IJ SOLN
10.0000 [IU] | Freq: Once | INTRAMUSCULAR | Status: AC
  Administered 2023-09-22: 10 [IU] via SUBCUTANEOUS

## 2023-09-22 MED ORDER — OXYCODONE HCL 5 MG PO TABS
5.0000 mg | ORAL_TABLET | Freq: Once | ORAL | Status: AC
Start: 2023-09-22 — End: 2023-09-22
  Administered 2023-09-22: 5 mg via ORAL
  Filled 2023-09-22: qty 1

## 2023-09-22 NOTE — Progress Notes (Signed)
Patient requesting pain medication several times prior to it being due. Explained that medication was not due at requested time and that this writer would bring it when it due again. Patient also requesting an extra dose of Seroquel at this time. Informed patient that I would reach out to provider to see if I could get a one time dose of pain medication and Seroquel. Patient requesting several blankets but refuses to put gown on. Patient was given two additional warm blankets; and was inform that linen was low and we was waiting on more so he may have to wait if he needs more. Temperature in room adjusted. Patient insisted on speaking with charge nurse and stated that staff was lying to him. Patient reassured that he is being given the correct information and was able to speak with charge nurse. PRN clonazepam 0.25 mg administered to patient. Waiting response of provider for pain medication and Seroquel medication.

## 2023-09-22 NOTE — Progress Notes (Signed)
   09/21/23 1946  Vitals  BP (!) 92/47  MAP (mmHg) (!) 62  BP Method Automatic  MEWS COLOR  MEWS Score Color Yellow  Oxygen Therapy  SpO2 94 %  MEWS Score  MEWS Temp 0  MEWS Systolic 1  MEWS Pulse 2  MEWS RR 0  MEWS LOC 0  MEWS Score 3  Provider Notification  Provider Name/Title Luiz Iron NP  Date Provider Notified 09/21/23  Time Provider Notified 2030  Method of Notification Page  Notification Reason Other (Comment) (Patient had a yellow mew due to low bloodpressure and elevated pulse reading vitals rechecked and he was green has been green since)  Provider response No new orders  Date of Provider Response 09/21/23  Time of Provider Response 2040

## 2023-09-22 NOTE — Progress Notes (Signed)
PROGRESS NOTE    [name]  AOZ:[id number] DOB: 1981/02/18 DOA: 09/09/2023 PCP: [name], NP  No chief complaint on file.   Hospital Course:  This patient has had an extensive 60+ day hospital admission.  Briefly:  [name] is a 42 y.o. male with medical history significant of ESRD on hemodialysis (not fully compliant), insulin-dependent type 1 diabetes, gastroparesis, Left AKA, admitted to [facility] on 07/09/23 for DKA, Sepsis and hospital course significant for MV AND TV MRSA endocarditis, followed by acute MCA stroke, left occipital and bilateral cerebellar strokes consistent with septic emboli,  Brain abscess from MRSA, left IJ DVT. He was transferred to [facility] for MV repair on 11/29.  While at [facility] he underwent cardiac cath showing non obstructive disease, craniotomy with abscess drainage on 09/01/23. He had subarachnoid hemorrhage while on IV heparin. Also complicated by right third toe distal necrosis,was seen by podiatry, but family wanted conservative management.  Since his tertiary care needs have been completed, he was transferred back to [facility] for further management.  Therapy eval recommending SNF.  Subjective: On evaluation this morning patient is lying in bed naked and refuses to be covered.  Patient refuses to answer any questions about how he is doing medically.  He has many complaints about hospital staff.   Objective: Vitals:   09/21/23 2304 09/22/23 0334 09/22/23 0941 09/22/23 1219  BP: (!) 124/93 100/61 (!) 101/49 (!) 122/105  Pulse: (!) 108 100 (!) 122 (!) 123  Resp: 18  20 20   Temp: 98.5 F (36.9 C) 99.2 F (37.3 C)  97.9 F (36.6 C)  TempSrc: Oral Oral  Oral  SpO2: 100% 92% 94% 94%  Weight:      Height:        Intake/Output Summary (Last 24 hours) at 09/22/2023 1631 Last data filed at 09/22/2023 0400 Gross per 24 hour  Intake 480 ml  Output --  Net 480 ml   Filed Weights   09/19/23 1025 09/21/23 1345 09/21/23 1616  Weight: 82.4 kg 86.5  kg 84.3 kg    Examination: General exam: Patient is lying in bed, naked, holding his penis, refuses to put on clothing when asked Respiratory system: No work of breathing, symmetric chest wall expansion Gastrointestinal system: Abdomen is nondistended, Neuro: Alert and oriented. No focal neurological deficits. Extremities: L aka, expected ROM Psychiatry: Speech is tangential, curses frequently, difficult to redirect.   Assessment & Plan:  Principal Problem:   Endocarditis Active Problems:   MRSA bacteremia   Endocarditis of tricuspid valve   ESRD on dialysis 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 - Currently on Seroquel - Status post cognitive eval with SLP on 12/20 - Mental status appears to wax and wane throughout this  admission - Consider formal psychiatry evaluation to seek guardianship, at this time it does not appear that patient has capacity to make decisions on his own - His mother is at bedside and frequently involved in his care  ESRD on hemodialysis MWF - Nephrology consulted - HD per nephro - On daily Lokelma due to persistent hyperkalemia - Patient  not compliant with diet  Right third toe necrosis - Was seen by podiatry at [facility], who recommended surgical intervention.  Family has opted for conservative measures with IV antibiotics  Physical deconditioning - Prolonged hospital stay complicated by left AKA - Continue working with therapy as tolerating - Seeking SNF beds  Type 1 diabetes, insulin-dependent - Patient noncompliant with diet - Continue basal/bolus.  Blood glucose is extremely labile.  Avoid aggressive changes in regimen - Continue CBG monitoring  Gastroparesis - Stable on current meds  Left internal jugular DVT - Continue Eliquis  Subarachnoid hemorrhage while on heparin - Stable now - Continue to monitor mental status  Prostate fluid collection - Dilated seminal vesicle and no surgical intervention needed per urology.  Will follow-up at discharge  Anemia of chronic disease Hemoglobin baseline appears close to be 8 - Stable for now - Trend CBC - Transfuse if falls below 7  BMI 26 - Outpatient follow up for lifestyle modification and risk factor management   DVT prophylaxis: Eliquis   Code Status: Full Code Family Communication: [name] at bedside and present for discussion. Disposition:  Status is: Inpatient, pending dispo planning   Consultants:    Treatment Team:  Consulting Physician: Delano Metz, MD  Antimicrobials:  Anti-infectives (From admission, onward)    Start     Dose/Rate Route Frequency Ordered Stop   09/24/23 1200  vancomycin (VANCOREADY) IVPB 500 mg/100 mL        500 mg 100 mL/hr over 60 Minutes Intravenous Every M-W-F (Hemodialysis) 09/21/23 1246 10/29/23 1159   09/20/23 1200  vancomycin (VANCOCIN) 750 mg in sodium chloride 0.9 % 250 mL IVPB  Status:  Discontinued        750 mg 265 mL/hr over 60 Minutes Intravenous Every M-W-F (Hemodialysis) 09/20/23 0446 09/21/23 1246   09/20/23 1045  vancomycin (VANCOCIN) 750 mg in sodium chloride 0.9 % 250 mL IVPB        750  mg 265 mL/hr over 60 Minutes Intravenous  Once 09/20/23 0959 09/20/23 1226   09/20/23 0945  vancomycin (VANCOREADY) IVPB 750 mg/150 mL  Status:  Discontinued        750 mg 150 mL/hr over 60 Minutes Intravenous  Once 09/20/23 0855 09/20/23 0959   09/10/23 1200  vancomycin (VANCOREADY) IVPB 750 mg/150 mL  Status:  Discontinued        750 mg 150 mL/hr over 60 Minutes Intravenous Every M-W-F (Hemodialysis) 09/09/23 2030 09/20/23 0446   09/09/23 2200  linezolid (ZYVOX) tablet 600 mg  Status:  Discontinued        600 mg Oral Every 12 hours 09/09/23 2035 09/10/23 0946       Data Reviewed: I have personally reviewed following labs and imaging studies CBC: Recent Labs  Lab 09/17/23 0756 09/19/23 2030 09/20/23 1258 09/21/23 0620  WBC 5.9 6.3 7.3 6.4  NEUTROABS 4.0 4.6 5.1 4.2  HGB 9.8* 8.5* 8.3* 9.1*  HCT 31.3* 27.7* 27.1* 29.3*  MCV 92.9 94.2 94.1 92.7  PLT 196 187 203 189   Basic Metabolic Panel: Recent Labs  Lab 09/17/23 0756 09/17/23 0757 09/19/23 1035 09/19/23 2030 09/20/23 1258 09/21/23 1610  09/22/23 0559  NA  --    < > 132* 135 134* 134* 134*  K  --    < > 6.2* 4.9 5.3* 6.3* 5.8*  CL  --    < > 97* 97* 95* 97* 96*  CO2  --    < > 21* 25 27 25 24   GLUCOSE  --    < > 196* 365* 55* 223* 355*  BUN  --    < > 53* 29* 37* 47* 41*  CREATININE  --    < > 5.87* 4.02* 4.89* 5.80* 5.44*  CALCIUM  --    < > 9.1 9.1 9.1 9.5 9.0  MG 2.1  --   --  1.8 1.8 2.0 1.8  PHOS  --    < > 8.8* 5.7* 6.8* 8.5* 8.2*   < > = values in this interval not displayed.   GFR: Estimated Creatinine Clearance: 18 mL/min (A) (by C-G formula based on SCr of 5.44 mg/dL (H)). Liver Function Tests: Recent Labs  Lab 09/19/23 1035 09/19/23 2030 09/20/23 1258 09/21/23 0620 09/22/23 0559  ALBUMIN 2.5* 2.7* 2.6* 2.6* 2.6*   CBG: Recent Labs  Lab 09/21/23 1211 09/21/23 1806 09/21/23 1945 09/22/23 0912 09/22/23 1210  GLUCAP 257* 122* 227* 464* 420*    No results found for this or any previous  visit (from the past 240 hours).   Radiology Studies: No results found.  Scheduled Meds:  apixaban  5 mg Oral BID   bacitracin   Topical Daily   calcitRIOL  1.5 mcg Oral Q M,W,F-HD   Chlorhexidine Gluconate Cloth  6 each Topical Q0600   Chlorhexidine Gluconate Cloth  6 each Topical Q0600   darbepoetin (ARANESP) injection - DIALYSIS  100 mcg Subcutaneous Q Sun-1800   gabapentin  100 mg Oral TID   insulin aspart  0-6 Units Subcutaneous TID AC & HS   insulin glargine-yfgn  4 Units Subcutaneous Daily   loratadine  10 mg Oral Daily   metoprolol tartrate  25 mg Oral BID   pantoprazole  40 mg Oral Daily   povidone-Iodine   Topical Q0600   QUEtiapine  12.5 mg Oral q morning   QUEtiapine  25 mg Oral QHS   senna-docusate  2 tablet Oral BID   sevelamer carbonate  800 mg Oral TID WC   sodium zirconium cyclosilicate  10 g Oral Daily   Continuous Infusions:  heparin sodium (porcine)     [START ON 09/24/2023] vancomycin       LOS: 13 days    Time spent:    Debarah Crape, DO [facility]  To contact the attending physician between 7A-7P please use Epic Chat. To contact the covering physician during after hours 7P-7A, please review Amion.   09/22/2023, 4:31 PM   *This document has been created with the assistance of dictation software. Please excuse typographical errors. *

## 2023-09-22 NOTE — Plan of Care (Signed)
  Problem: Clinical Measurements: Goal: Will remain free from infection Outcome: Progressing Goal: Respiratory complications will improve Outcome: Progressing Goal: Cardiovascular complication will be avoided Outcome: Progressing   Problem: Activity: Goal: Risk for activity intolerance will decrease Outcome: Progressing   Problem: Nutrition: Goal: Adequate nutrition will be maintained Outcome: Progressing   Problem: Coping: Goal: Level of anxiety will decrease Outcome: Progressing   Problem: Elimination: Goal: Will not experience complications related to bowel motility Outcome: Progressing   Problem: Pain Management: Goal: General experience of comfort will improve Outcome: Progressing   Problem: Safety: Goal: Ability to remain free from injury will improve Outcome: Progressing

## 2023-09-22 NOTE — Plan of Care (Signed)
  Problem: Nutrition: Goal: Adequate nutrition will be maintained Outcome: Progressing   Problem: Elimination: Goal: Will not experience complications related to bowel motility Outcome: Progressing   Problem: Pain Management: Goal: General experience of comfort will improve Outcome: Progressing   Problem: Safety: Goal: Ability to remain free from injury will improve Outcome: Progressing

## 2023-09-22 NOTE — Progress Notes (Signed)
[name] Progress Note   OP HD: [facility] MWF  4h   400/1.5  77kg  L fem TDC 2/3 bath  Heparin none   Assessment/ Plan: 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 Not a candidate for surgery here. SP line holiday w/ new TDC placed 10/18. Continues on IV vanc upon return from [place] . Adjusted plan per ID pharm is 750mg  MWF through 10/27/23.  Hyperkalemia - K+ 5.3 today after being started on daily Lokelma. Patient refuses renal diet. Follow labs.  SP I&D brain abscess: done 12/04 at [facility] much better.  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 Next HD Fri; on and off early on Tuesday we are only able to get 1.2 L off.  May need 1K bath first hour on dialysis given his propensity to sign off early. Volume: on RA, L arm + edema, no other edema. BP's dropping here lately. Lowered UF to 2 L today.  On for dialysis today. Anemia: Hb 8.3. ESA dose increased to 100 mcg sq weekly. Will resume here. Hold iron infusion while on IV ABX. 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 Cont binders w/ meals.  Hypertension: BP's controlled, cont metoprolol 25 bid. BP's soft lately. May need to make adjustments this week.  HD access:  L  femoral TDC placed on 10/18. 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  In the future he may be an option for the Surfacer device to get something in the upper body.  Will send secure chat to Dr. Gilmer Mor. Dispo - could not dialyze well in the chair 12/18 today, pt required the chair be in a supine position or he would have slid out of the chair. Continue to attempt to have HD in recliner  Subjective: He signed off early on dialysis Tuesday, comfortable today. No complaints.  Objective Vitals:   09/21/23 2015 09/21/23 2304 09/22/23 0334 09/22/23 0941  BP: 110/70 (!) 124/93 100/61 (!) 101/49  Pulse: (!) 102 (!) 108 100 (!) 122  Resp:  18  20  Temp:   98.5 F (36.9 C) 99.2 F (37.3 C)   TempSrc:  Oral Oral   SpO2: 96% 100% 92% 94%  Weight:      Height:       Physical Exam General: Chronically ill appearing male in NAD Heart:S1,S2 RRR No M/R/G Lungs: CTAB no WOB Abdomen: NABS, NT Extremities: L AKA No RLE edema. Edema LUE Dialysis Access: L femoral TDC drsg intact  Additional Objective Labs: Basic Metabolic Panel: Recent Labs  Lab 09/20/23 1258 09/21/23 0620 09/22/23 0559  NA 134* 134* 134*  K 5.3* 6.3* 5.8*  CL 95* 97* 96*  CO2 27 25 24   GLUCOSE 55* 223* 355*  BUN 37* 47* 41*  CREATININE 4.89* 5.80* 5.44*  CALCIUM 9.1 9.5 9.0  PHOS 6.8* 8.5* 8.2*   Liver Function Tests: Recent Labs  Lab 09/20/23 1258 09/21/23 0620 09/22/23 0559  ALBUMIN 2.6* 2.6* 2.6*   No results for input(s): "LIPASE", "AMYLASE" in the last 168 hours. CBC: Recent Labs  Lab 09/17/23 0756 09/19/23 2030 09/20/23 1258 09/21/23 0620  WBC 5.9 6.3 7.3 6.4  NEUTROABS 4.0 4.6 5.1 4.2  HGB 9.8* 8.5* 8.3* 9.1*  HCT 31.3* 27.7* 27.1* 29.3*  MCV 92.9 94.2 94.1 92.7  PLT 196 187 203 189   Blood Culture    Component Value Date/Time   SDES BLOOD SITE  NOT SPECIFIED 08/25/2023 1706   SPECREQUEST  08/25/2023 1706    BOTTLES DRAWN AEROBIC ONLY Blood Culture results may not be optimal due to an inadequate volume of blood received in culture bottles   CULT  08/25/2023 1706    NO GROWTH 5 DAYS Performed at [facility], [address] [place], [name] [address]    REPTSTATUS 08/30/2023 FINAL 08/25/2023 1706    Cardiac Enzymes: No results for input(s): "CKTOTAL", "CKMB", "CKMBINDEX", "TROPONINI" in the last 168 hours. CBG: Recent Labs  Lab 09/21/23 0820 09/21/23 1211 09/21/23 1806 09/21/23 1945 09/22/23 0912  GLUCAP 231* 257* 122* 227* 464*   Iron Studies: No results for input(s): "IRON", "TIBC", "TRANSFERRIN", "FERRITIN" in the last 72 hours. @lablastinr3 @ Studies/Results: No results found. Medications:  heparin sodium  (porcine)     [START ON 09/24/2023] vancomycin      apixaban  5 mg Oral BID   bacitracin   Topical Daily   calcitRIOL  1.5 mcg Oral Q M,W,F-HD   Chlorhexidine Gluconate Cloth  6 each Topical Q0600   Chlorhexidine Gluconate Cloth  6 each Topical Q0600   darbepoetin (ARANESP) injection - DIALYSIS  100 mcg Subcutaneous Q Sun-1800   gabapentin  100 mg Oral TID   insulin aspart  0-6 Units Subcutaneous TID AC & HS   insulin glargine-yfgn  4 Units Subcutaneous Daily   loratadine  10 mg Oral Daily   metoprolol tartrate  25 mg Oral BID   pantoprazole  40 mg Oral Daily   povidone-Iodine   Topical Q0600   QUEtiapine  12.5 mg Oral q morning   QUEtiapine  25 mg Oral QHS   senna-docusate  2 tablet Oral BID   sevelamer carbonate  800 mg Oral TID WC   sodium zirconium cyclosilicate  10 g Oral Daily

## 2023-09-23 DIAGNOSIS — I38 Endocarditis, valve unspecified: Secondary | ICD-10-CM | POA: Diagnosis not present

## 2023-09-23 DIAGNOSIS — D631 Anemia in chronic kidney disease: Secondary | ICD-10-CM | POA: Diagnosis not present

## 2023-09-23 DIAGNOSIS — N186 End stage renal disease: Secondary | ICD-10-CM | POA: Diagnosis not present

## 2023-09-23 DIAGNOSIS — B9562 Methicillin resistant Staphylococcus aureus infection as the cause of diseases classified elsewhere: Secondary | ICD-10-CM | POA: Diagnosis not present

## 2023-09-23 DIAGNOSIS — N250 Renal osteodystrophy: Secondary | ICD-10-CM | POA: Diagnosis not present

## 2023-09-23 DIAGNOSIS — I330 Acute and subacute infective endocarditis: Secondary | ICD-10-CM | POA: Diagnosis not present

## 2023-09-23 DIAGNOSIS — Z992 Dependence on renal dialysis: Secondary | ICD-10-CM | POA: Diagnosis not present

## 2023-09-23 DIAGNOSIS — R7881 Bacteremia: Secondary | ICD-10-CM | POA: Diagnosis not present

## 2023-09-23 DIAGNOSIS — I120 Hypertensive chronic kidney disease with stage 5 chronic kidney disease or end stage renal disease: Secondary | ICD-10-CM | POA: Diagnosis not present

## 2023-09-23 LAB — GLUCOSE, CAPILLARY
Glucose-Capillary: 125 mg/dL — ABNORMAL HIGH (ref 70–99)
Glucose-Capillary: 138 mg/dL — ABNORMAL HIGH (ref 70–99)
Glucose-Capillary: 198 mg/dL — ABNORMAL HIGH (ref 70–99)
Glucose-Capillary: 246 mg/dL — ABNORMAL HIGH (ref 70–99)
Glucose-Capillary: 298 mg/dL — ABNORMAL HIGH (ref 70–99)
Glucose-Capillary: 53 mg/dL — ABNORMAL LOW (ref 70–99)
Glucose-Capillary: 58 mg/dL — ABNORMAL LOW (ref 70–99)
Glucose-Capillary: 69 mg/dL — ABNORMAL LOW (ref 70–99)
Glucose-Capillary: 86 mg/dL (ref 70–99)
Glucose-Capillary: 92 mg/dL (ref 70–99)

## 2023-09-23 LAB — RENAL FUNCTION PANEL
Albumin: 2.6 g/dL — ABNORMAL LOW (ref 3.5–5.0)
Anion gap: 13 (ref 5–15)
BUN: 50 mg/dL — ABNORMAL HIGH (ref 6–20)
CO2: 26 mmol/L (ref 22–32)
Calcium: 9.2 mg/dL (ref 8.9–10.3)
Chloride: 97 mmol/L — ABNORMAL LOW (ref 98–111)
Creatinine, Ser: 6.45 mg/dL — ABNORMAL HIGH (ref 0.61–1.24)
GFR, Estimated: 10 mL/min — ABNORMAL LOW (ref >60)
Glucose, Bld: 72 mg/dL (ref 70–99)
Phosphorus: 8.9 mg/dL — ABNORMAL HIGH (ref 2.5–4.6)
Potassium: 6 mmol/L — ABNORMAL HIGH (ref 3.5–5.1)
Sodium: 136 mmol/L (ref 135–145)

## 2023-09-23 LAB — MAGNESIUM: Magnesium: 2 mg/dL (ref 1.7–2.4)

## 2023-09-23 MED ORDER — DEXTROSE 50 % IV SOLN
INTRAVENOUS | Status: AC
  Filled 2023-09-23: qty 50

## 2023-09-23 MED ORDER — SODIUM ZIRCONIUM CYCLOSILICATE 10 G PO PACK
10.0000 g | PACK | Freq: Two times a day (BID) | ORAL | Status: DC
  Administered 2023-09-23 – 2023-09-24 (×4): 10 g via ORAL
  Filled 2023-09-23 (×5): qty 1

## 2023-09-23 MED ORDER — GERHARDT'S BUTT CREAM
TOPICAL_CREAM | CUTANEOUS | Status: DC | PRN
  Filled 2023-09-23: qty 60

## 2023-09-23 MED ORDER — SODIUM CHLORIDE 0.9 % IV BOLUS
250.0000 mL | Freq: Once | INTRAVENOUS | Status: AC
  Administered 2023-09-23: 250 mL via INTRAVENOUS

## 2023-09-23 MED ORDER — GLUCOSE 40 % PO GEL
1.0000 | ORAL | Status: AC
  Filled 2023-09-23: qty 1.21

## 2023-09-23 MED ORDER — GERHARDT'S BUTT CREAM
TOPICAL_CREAM | Freq: Two times a day (BID) | CUTANEOUS | Status: DC
  Administered 2023-09-30 – 2023-11-16 (×4): 1 via TOPICAL
  Filled 2023-09-23 (×4): qty 60

## 2023-09-23 MED ORDER — SODIUM ZIRCONIUM CYCLOSILICATE 10 G PO PACK
10.0000 g | PACK | Freq: Once | ORAL | Status: AC
  Administered 2023-09-23: 10 g via ORAL
  Filled 2023-09-23: qty 1

## 2023-09-23 MED ORDER — DEXTROSE 50 % IV SOLN
INTRAVENOUS | Status: AC
  Administered 2023-09-23: 50 mL
  Filled 2023-09-23: qty 50

## 2023-09-23 MED ORDER — DEXTROSE 50 % IV SOLN: 50.0000 mL | Freq: Once | INTRAVENOUS | Status: AC

## 2023-09-23 NOTE — Progress Notes (Signed)
Service response called this nurse to notified that patient will need the staff to call his trays down if he continues with the foul language after repeatedly been asks to stop. I informed the supervisor that this is his baseline, keep me posted if we have to call for his trays.

## 2023-09-23 NOTE — Progress Notes (Signed)
Orthopedic Tech Progress Note Patient Details:  [name] Jul 23, 1981 [id number]  Arm sling delivered for 6N23.  Ortho Devices Type of Ortho Device: Arm sling Ortho Device/Splint Interventions: Ordered      Docia Furl 09/23/2023, 3:40 PM

## 2023-09-23 NOTE — Progress Notes (Signed)
Assessed using ultrasound for PIV, veins is so tiny, non compressible . No suitable vein found. RN notified.

## 2023-09-23 NOTE — Progress Notes (Signed)
PROGRESS NOTE  KADARI FIER MVH:[id number] DOB: 11-30-1980 DOA: 09/09/2023 PCP: [name], NP  Brief History   This patient has had an extensive 60+ day hospital admission.  Briefly:  [name] is a 42 y.o. 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 He was transferred to [facility] for MV repair on 11/29.  While at [facility] he underwent cardiac cath showing non obstructive disease, craniotomy with abscess drainage on 09/01/23. He had subarachnoid hemorrhage while on IV heparin. Also complicated by right third toe distal necrosis,was seen by podiatry, but family wanted conservative management.  Since his tertiary care needs have been completed, he was transferred back to [facility] for further management.  Therapy eval recommending SNF.  On 09/23/2023 the patient is requesting increase in oxycodone due to pain in his backside. WOC was consulted. The patient's decubitus has advanced to a stage 2. I have asked nursing to assist in offloading his sacrum and buttocks. Increasing oxycodone to allow him to continue to put pressure on this area is not going to improve the situation.  The patient was hypoglycemic this morning. His lantus was discontinued. The patient is refusing oral substances to raise his glucoses and is demanding IV glucose to bring up his glucose. However, the patient only has one IV left, and he is likely to lose that soon. The patient is strongly urged to allow oral supplementation to improve glucoses when his glucoses get low. Will continue D5 for now.  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 - Currently on Seroquel - Status post cognitive eval with SLP on 12/20 - Mental status appears to wax and wane throughout this admission - Consider formal psychiatry evaluation to seek guardianship, at this time it does not appear that patient has capacity to make decisions on his own - His mother is at bedside and frequently involved in his care   ESRD on hemodialysis MWF - Nephrology consulted - HD per nephro - On daily Lokelma due to persistent hyperkalemia - Patient not compliant with diet   Right third toe necrosis - Was seen by podiatry at [facility], who recommended surgical intervention.  Family has opted for conservative measures with IV antibiotics   Physical deconditioning - Prolonged hospital stay complicated by left  AKA - Continue working with therapy as tolerating - Seeking SNF beds   Type 1 diabetes, insulin-dependent - Patient noncompliant with diet - Continue basal/bolus.  Blood glucose is extremely labile.  Avoid aggressive changes in regimen - Continue CBG  monitoring --Hypoglycemia this morning.  His lantus was discontinued. The patient is refusing oral substances to raise his glucoses and is demanding IV glucose to bring up his glucose. However, the patient only has one IV left, and he is likely to lose that soon. The patient is strongly urged to allow oral supplementation to improve glucoses when his glucoses get low. Will continue D5 for now.    Gastroparesis - Stable on current meds   Left internal jugular DVT - Continue Eliquis   Subarachnoid hemorrhage while on heparin - Stable now - Continue to monitor mental status   Prostate fluid collection - Dilated seminal vesicle and no surgical intervention needed per urology.  Will follow-up at discharge   Anemia of chronic disease Hemoglobin baseline appears close to be 8 - Stable for now - Trend CBC - Transfuse if falls below 7   BMI 26 - Outpatient follow up for lifestyle modification and risk factor management     DVT prophylaxis: Eliquis   Code Status: Full Code Family Communication: [name] at bedside and present for discussion. Disposition:  Status is: Inpatient, pending dispo planning    Consultants:    Nephrology Wound Care Neurosurgery  Consulting Physician: Delano Metz, MD   Antimicrobials:   I have seen and examined this patient myself. I have spent 34 minutes in his evaluation and care.  Deiona Hooper, DO [facility] Direct contact: see [email or web address]  7PM-7AM contact night coverage as above 09/23/2023, 5:01 PM  LOS: 14 days   Procedures  S/P craniotomy, MV and TV endocarditis repair.  Dialysis  Antibiotics   Anti-infectives (From admission, onward)    Start     Dose/Rate Route Frequency Ordered Stop   09/24/23 1200  vancomycin (VANCOREADY) IVPB 500 mg/100 mL        500 mg 100 mL/hr over 60 Minutes Intravenous Every M-W-F (Hemodialysis) 09/21/23 1246 10/29/23 1159   09/20/23 1200  vancomycin (VANCOCIN) 750 mg in sodium chloride 0.9  % 250 mL IVPB  Status:  Discontinued        750 mg 265 mL/hr over 60 Minutes Intravenous Every M-W-F (Hemodialysis) 09/20/23 0446 09/21/23 1246   09/20/23 1045  vancomycin (VANCOCIN) 750 mg in sodium chloride 0.9 % 250 mL IVPB        750 mg 265 mL/hr over 60 Minutes Intravenous  Once 09/20/23 0959 09/20/23 1226   09/20/23 0945  vancomycin (VANCOREADY) IVPB 750 mg/150 mL  Status:  Discontinued        750 mg 150 mL/hr over 60 Minutes Intravenous  Once 09/20/23 0855 09/20/23 0959   09/10/23 1200  vancomycin (VANCOREADY) IVPB 750 mg/150 mL  Status:  Discontinued        750 mg 150 mL/hr over 60 Minutes Intravenous Every M-W-F (Hemodialysis) 09/09/23 2030 09/20/23 0446   09/09/23 2200  linezolid (ZYVOX) tablet 600 mg  Status:  Discontinued        600 mg Oral Every 12 hours 09/09/23 2035 09/10/23 0946        Interval History/Subjective  The patient is complaining of pain in his bottom due to his pressure ulcer. He is demanding an increase in oxycodone. He does not like the answer of offloading this area to relieve pressure. Wound care has  been consulted.   Objective   Vitals:  Vitals:   09/23/23 0736 09/23/23 1500  BP: 97/80 129/71  Pulse: (!) 108 88  Resp: 18 20  Temp: 98.1 F (36.7 C) 98.1 F (36.7 C)  SpO2:  98%    Exam:  Constitutional:  The patient is awake, alert, and oriented x 3. Mild distress from pain due to sacral decubitus ulcers. Respiratory:  No increased work of breathing. No wheezes, rales, or rhonchi No tactile fremitus Cardiovascular:  Regular rate and rhythm No murmurs, ectopy, or gallups. No lateral PMI. No thrills. Abdomen:  Abdomen is soft, non-tender, non-distended No hernias, masses, or organomegaly Normoactive bowel sounds.  Musculoskeletal:  No cyanosis, clubbing, or edema Skin:  No rashes, lesions, ulcers palpation of skin: no induration or nodules Neurologic:  CN 2-12 intact Sensation all 4 extremities intact Psychiatric:  Mental  status Mood, affect appropriate Orientation to person, place, time  judgment and insight appear intact    I have personally reviewed the following:   Today's Data  Vitals  Lab Data  CBC    Component Value Date/Time   WBC 6.4 09/21/2023 0620   RBC 3.16 (L) 09/21/2023 0620   HGB 9.1 (L) 09/21/2023 0620   HGB 9.7 (L) 08/22/2014 0447   HCT 29.3 (L) 09/21/2023 0620   HCT 30.0 (L) 08/22/2014 0447   PLT 189 09/21/2023 0620   PLT 158 08/22/2014 0447   MCV 92.7 09/21/2023 0620   MCV 97 08/22/2014 0447   MCH 28.8 09/21/2023 0620   MCHC 31.1 09/21/2023 0620   RDW 14.7 09/21/2023 0620   RDW 15.3 (H) 08/22/2014 0447   LYMPHSABS 1.3 09/21/2023 0620   LYMPHSABS 1.7 08/22/2014 0447   MONOABS 0.6 09/21/2023 0620   MONOABS 0.8 08/22/2014 0447   EOSABS 0.3 09/21/2023 0620   EOSABS 0.4 08/22/2014 0447   BASOSABS 0.1 09/21/2023 0620   BASOSABS 0.1 08/22/2014 0447      Latest Ref Rng & Units 09/23/2023    6:33 AM 09/22/2023    5:59 AM 09/21/2023    6:20 AM  BMP  Glucose 70 - 99 mg/dL 72  409  811   BUN 6 - 20 mg/dL 50  41  47   Creatinine 0.61 - 1.24 mg/dL 9.14  7.82  9.56   Sodium 135 - 145 mmol/L 136  134  134   Potassium 3.5 - 5.1 mmol/L 6.0  5.8  6.3   Chloride 98 - 111 mmol/L 97  96  97   CO2 22 - 32 mmol/L 26  24  25    Calcium 8.9 - 10.3 mg/dL 9.2  9.0  9.5      Micro Data   Results for orders placed or performed during the hospital encounter of 09/09/23  MRSA Next Gen by PCR, Nasal     Status: None   Collection Time: 09/10/23  8:50 AM   Specimen: Nasal Mucosa; Nasal Swab  Result Value Ref Range Status   MRSA by PCR Next Gen NOT DETECTED NOT DETECTED Final    Comment: (NOTE) The GeneXpert MRSA Assay (FDA approved for NASAL specimens only), is one component of a comprehensive MRSA colonization surveillance program. It is not intended to diagnose MRSA infection nor to guide or monitor treatment for MRSA infections. Test performance is not FDA approved in patients  less than 48 years old. Performed at [facility], [address] [place], [name] [address]     Scheduled Meds:  apixaban  5 mg  Oral BID   bacitracin   Topical Daily   calcitRIOL  1.5 mcg Oral Q M,W,F-HD   Chlorhexidine Gluconate Cloth  6 each Topical Q0600   Chlorhexidine Gluconate Cloth  6 each Topical Q0600   darbepoetin (ARANESP) injection - DIALYSIS  100 mcg Subcutaneous Q Sun-1800   dextrose  1 Tube Oral STAT   dextrose       gabapentin  100 mg Oral TID   Gerhardt's butt cream   Topical BID   insulin aspart  0-6 Units Subcutaneous TID AC & HS   loratadine  10 mg Oral Daily   metoprolol tartrate  25 mg Oral BID   pantoprazole  40 mg Oral Daily   povidone-Iodine   Topical Q0600   QUEtiapine  12.5 mg Oral q morning   QUEtiapine  25 mg Oral QHS   senna-docusate  2 tablet Oral BID   sevelamer carbonate  800 mg Oral TID WC   sodium zirconium cyclosilicate  10 g Oral BID   Continuous Infusions:  heparin sodium (porcine)     [START ON 09/24/2023] vancomycin      Principal Problem:   Endocarditis Active Problems:   MRSA bacteremia   Endocarditis of tricuspid valve   ESRD on dialysis ([name])   Cognitive impairment   Insulin dependent type 1 diabetes mellitus ([name])   Diabetic gastroparesis ([name])   Diabetic foot infection ([name])   Endocarditis of mitral valve   Cerebrovascular accident (CVA) due to embolism of cerebral artery ([name])   LOS: 14 days

## 2023-09-23 NOTE — TOC Progression Note (Signed)
Transition of Care [facility]) - Progression Note    Patient Details  Name: [name] MRN: [id number] Date of Birth: 1981/07/13  Transition of [facility] CM/SW Contact  Carley Hammed, LCSW Phone Number: 09/23/2023, 11:01 AM  Clinical Narrative:     CSW spoke with medical team and renal navigator to determine if there has been any progress with pt sitting for HD. At this time, this is a major barrier to placement efforts. Per medical team, pt has not been able to yet. Plan is for pt to attempt it again and documentation to be placed regarding progress. Once pt is able to sit for HD, renal navigator will work on getting him set up to have HD in the community and CSW can work on placement and transportation. TOC continues to follow for any updates.        Expected Discharge Plan and Services                                               Social Determinants of Health (SDOH) Interventions SDOH Screenings   Food Insecurity: No Food Insecurity (09/09/2023)  Housing: Unknown (09/09/2023)  Transportation Needs: No Transportation Needs (09/09/2023)  Utilities: Not At Risk (09/09/2023)  Alcohol Screen: Low Risk  (02/15/2023)  Depression (PHQ2-9): Low Risk  (02/15/2023)  Financial Resource Strain: Low Risk  (02/15/2023)  Physical Activity: Inactive (02/15/2023)  Social Connections: Socially Integrated (02/15/2023)  Stress: No Stress Concern Present (02/15/2023)  Tobacco Use: Low Risk  (09/12/2023)  Recent Concern: Tobacco Use - Medium Risk (09/01/2023)   Received from [facility]    Readmission Risk Interventions     No data to display

## 2023-09-23 NOTE — Progress Notes (Addendum)
Physical Therapy Treatment Patient Details Name: [name] MRN: [id number] DOB: [birth year] Today's Date: 09/23/2023   History of Present Illness 42 y.o. male presents to [facility] 09/09/23 as a transfer from [facility] for further management of endocarditis. Pt originally was admitted to [facility] 07/09/23 for DKA and sepsis after being found on the ground. 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 10/17 pt had a R MCA, L occipital, and B cerebellum CVA w/ septic emboli. ICU admit 10/18 w/ intubation 10/18-10/24. Pt was transferred to [facility] for MV repair on 11/29. At [facility], pt had cardiac cath w/ non obstructive disease, SAH, a craniotomy w/ abscess drainage on 12/4, and R third toe distal necrosis. PMH: chronic L AKA, MRSA, IDDM, ESRD on HD MWF, HTN, TEE positive for vegetation on multiple valves, cellulitis, medical noncompliance, narcotic dependence, DM with gastroparesis, L internal jugular DVT.    PT Comments  Pt received in supine, agreeable to therapy session to work on seated balance and transfer training. Session limited due to mostly only +1 assist available (MS arrived toward end of session to assist) and pt having difficulty following cues for body mechanics and safety. Pt needing up to maxA to perform bed mobility and min to maxA for seated balance with single UE support at EOB. Pt unable to safely sequence squat pivot or lateral scooting along EOB with only +1 assist so pt returned to supine and pt +2 totalA for posterior supine scooting toward HOB and pt bed placed in chair posture with LUE supported/elevated for edema mgmt at end of session, pt not agreeable to attempt transfer OOB to HD recliner due to not wanting food to get cold and pt fatigue after sitting EOB. RN notified pt will need lift pad/sling to transfer OOB to HD chair following date prior to HD to continue working toward goals of care/discharge to post-acute skilled rehab facility with OP HD.     If plan is discharge home, recommend the following: Two people to help with walking and/or transfers;Assist for transportation;Supervision due to cognitive status;Help with stairs or ramp for entrance;Two people to help with bathing/dressing/bathroom;Assistance with cooking/housework;Direct supervision/assist for financial management;Direct supervision/assist for medications management   Can travel by private vehicle     No  Equipment Recommendations  Hoyer lift;Wheelchair (measurements PT);Wheelchair cushion (measurements PT);Other (comment) (Roho cushion)    Recommendations for Other Services       Precautions / Restrictions Precautions Precautions: Fall Precaution Comments: L AKA, L hemiparesis and edema, L neglect, bowel incontinence Restrictions Weight Bearing Restrictions Per Provider Order: Yes LLE Weight Bearing Per Provider Order: Weight bearing as tolerated     Mobility  Bed Mobility Overal bed mobility: Needs Assistance Bed Mobility: Rolling, Sidelying to Sit, Sit to Sidelying Rolling: Max assist Sidelying to sit: Max assist, Used rails, HOB elevated     Sit to sidelying: Max assist General bed mobility comments: rolling to his R side then log roll to sit on EOB, once pt sitting up he had scooted hips too far forward at EOB and PTA was unable to walk around bed to assist with pt scooting hips back further onto surface due to pt poor seated balance with L HB neglect so decided to return to sidelying and max to totalA to return him safely to supine. Mobility specialist then arrived to room to assist with pulling him back up toward [facility] and positioning his hips to middle of bed, pt needing up to maxA for rolling for  new/clean bed pad under his hips and +2 totalA to scoot toward [facility] as he was too far from R bed side rail to pull up on.    Transfers                   General transfer comment: defer due to pt requesting to eat lunch prior to OOB and food tray arrived  at beginning of the session, pt states too fatigued to get to HD chair also after attempting seated balance. Sling for his LUE not yet arrived to the room.    Ambulation/Gait                   Stairs             Wheelchair Mobility     Tilt Bed    Modified Rankin (Stroke Patients Only)       Balance Overall balance assessment: Needs assistance Sitting-balance support: Single extremity supported, Feet supported Sitting balance-[name]: Poor Sitting balance - Comments: constant assist to maintain balance, esp as pt hips too far anterior on EOB and pt at risk of hips scooting off bed, so min to maxA while seated a couple mins Postural control: Right lateral lean     Standing balance comment: defer for pt safety, only +1 assist available for first half of session and once second person arrived, pt was already back in supine and refusing to attempt OOB again.                            Cognition Arousal: Alert Behavior During Therapy: WFL for tasks assessed/performed Overall Cognitive Status: Impaired/Different from baseline Area of Impairment: Attention, Memory, Following commands, Safety/judgement, Awareness, Problem solving                   Current Attention Level: Focused Memory: Decreased short-term memory Following Commands: Follows one step commands with increased time Safety/Judgement: Decreased awareness of safety, Decreased awareness of deficits Awareness: Intellectual Problem Solving: Slow processing, Difficulty sequencing, Requires verbal cues, Requires tactile cues General Comments: Verbose with poor attention, when pt cued to use RUE to assist LUE with repositioning, he will grasp limb for a few seconds then will let go, unable to attend to task more than a few seconds before impulsively reaching for bed rail or other area despite multimodal, repetitive cues. Severe L hemibody neglect still observed.        Exercises Other  Exercises Other Exercises: supine single leg bridges with RLE activation and cues to keep RUE at his side to assist, x3 reps    General Comments General comments (skin integrity, edema, etc.): LUE elevated on x2 pillows with wrist above elbow at end of session and pt's bed in chair posture to promote tolerance for upright position. HD chair in room, pt plans to get OOB to HD recliner prior to HD session in the morning      Pertinent Vitals/Pain Pain Assessment Pain Assessment: No/denies pain Faces Pain Scale: No hurt Pain Intervention(s): Monitored during session, Limited activity within patient's tolerance, Repositioned    Home Living                          Prior Function            PT Goals (current goals can now be found in the care plan section) Acute Rehab PT Goals PT Goal Formulation: With patient  Time For Goal Achievement: 09/24/23 Potential to Achieve Goals: Fair Progress towards PT goals: Progressing toward goals    Frequency    Min 1X/week      PT Plan      Co-evaluation              AM-PAC PT "6 Clicks" Mobility   Outcome Measure  Help needed turning from your back to your side while in a flat bed without using bedrails?: A Lot Help needed moving from lying on your back to sitting on the side of a flat bed without using bedrails?: Total Help needed moving to and from a bed to a chair (including a wheelchair)?: Total Help needed standing up from a chair using your arms (e.g., wheelchair or bedside chair)?: Total Help needed to walk in hospital room?: Total Help needed climbing 3-5 steps with a railing? : Total 6 Click Score: 7    End of Session Equipment Utilized During Treatment: Gait belt Activity Tolerance: Patient limited by fatigue (pt wanting to eat) Patient left: with call bell/phone within reach;in bed;with bed alarm set;Other (comment) (bed in chair posture) Nurse Communication: Mobility status;Need for lift equipment;Other  (comment) (HD chair brought to his room in anticipation of tomorrow's HD session) PT Visit Diagnosis: Other abnormalities of gait and mobility (R26.89);Other symptoms and signs involving the nervous system (R29.898);Hemiplegia and hemiparesis Hemiplegia - Right/Left: Left Hemiplegia - dominant/non-dominant: Non-dominant Hemiplegia - caused by: Cerebral infarction     Time: 9563-8756 PT Time Calculation (min) (ACUTE ONLY): 18 min  Charges:    $Therapeutic Activity: 8-22 mins $Neuromuscular Re-education: 8-22 mins PT General Charges $$ ACUTE PT VISIT: 1 Visit                     Priti Consoli P., PTA Acute Rehabilitation Services Secure Chat Preferred 9a-5:30pm Office: [phone]    Dorathy Kinsman [facility] 09/23/2023, 4:30 PM

## 2023-09-23 NOTE — Consult Note (Signed)
WOC Nurse Consult Note: Reason for Consult: Consult requested for buttocks.  Pt is frequently incontinent, according to progress notes, and has red, moist macerated skin to bilat buttocks and sacrum.  Left lower buttock with location which has evolved int a Stage 2 pressure injury; 2X2X.1cm, red and moist.  Pressure Injury POA: No Dressing procedure/placement/frequency: Topical treatment orders provided for bedside nurses to perform as follows to promote healing: Foam dressing to left buttock, change Q 3 days or PRN soiling. Apply Gerhardts butt cream to buttocks and sacrum BID and PRN with each turning or cleaning session . Please re-consult if further assistance is needed.  Thank-you,  Cammie Mcgee MSN, RN, CWOCN, [place], CNS [phone]

## 2023-09-23 NOTE — Progress Notes (Signed)
[facility] Progress Note   OP HD: [facility] MWF  4h   400/1.5  77kg  L fem TDC 2/3 bath  Heparin none   Assessment/ Plan: 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 Not a candidate for surgery here. SP line holiday w/ new TDC placed 10/18. Continues on IV vanc upon return from [place] . Adjusted plan per ID pharm is 750mg  MWF through 10/27/23.  Hyperkalemia - Pt not adhering to renal diet though sign on door with specific instructions for dietary.  Lokelma has been increased to BID, will add extra dose this afternoon too.  Follow daily labs.  SP I&D brain abscess: drained 12/04 at [facility]. Mentation improved. 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 Next HD Fri; on and off early on Tuesday we are only able to get 1.2 L off.   Volume: on RA, L arm + edema, no other edema. UF as tolerated with HD Anemia: Hb 8-9s, on ESA. Hold iron infusion while on IV ABX. 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 Cont binders w/ meals.  Hypertension: BP on low side recently, on low dose BB.   HD access:  L  femoral TDC placed on 10/18. 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 Type 1 DM: per primary Dispo - cont efforts to dialyze in chair.  Appears SNF being pursued and if pt declines formal psych eval to be pursued.  Subjective: no new complaints  Objective Vitals:   09/22/23 2315 09/22/23 2320 09/23/23 0419 09/23/23 0736  BP: (!) 61/47 (!) 81/69 (!) 107/50 97/80  Pulse: (!) 128 (!) 126 (!) 123 (!) 108  Resp: 19 19 17 18   Temp: 98.5 F (36.9 C) 98.5 F (36.9 C) 98.1 F (36.7 C) 98.1 F (36.7 C)  TempSrc: Axillary Axillary Axillary   SpO2: 99% 99% 94%   Weight:      Height:       Physical Exam General: Chronically ill appearing male in NAD, calm and cooperative Heart:S1,S2 RRR No M/R/G Lungs: CTAB no WOB Abdomen: NABS, NT Extremities: L AKA No RLE edema. Edema LUE Dialysis Access:  L femoral TDC drsg intact  Additional Objective Labs: Basic Metabolic Panel: Recent Labs  Lab 09/21/23 0620 09/22/23 0559 09/23/23 0633  NA 134* 134* 136  K 6.3* 5.8* 6.0*  CL 97* 96* 97*  CO2 25 24 26   GLUCOSE 223* 355* 72  BUN 47* 41* 50*  CREATININE 5.80* 5.44* 6.45*  CALCIUM 9.5 9.0 9.2  PHOS 8.5* 8.2* 8.9*   Liver Function Tests: Recent Labs  Lab 09/21/23 0620 09/22/23 0559 09/23/23 0633  ALBUMIN 2.6* 2.6* 2.6*   No results for input(s): "LIPASE", "AMYLASE" in the last 168 hours. CBC: Recent Labs  Lab 09/17/23 0756 09/19/23 2030 09/20/23 1258 09/21/23 0620  WBC 5.9 6.3 7.3 6.4  NEUTROABS 4.0 4.6 5.1 4.2  HGB 9.8* 8.5* 8.3* 9.1*  HCT 31.3* 27.7* 27.1* 29.3*  MCV 92.9 94.2 94.1 92.7  PLT 196 187 203 189   Blood Culture    Component Value Date/Time   SDES BLOOD SITE NOT SPECIFIED 08/25/2023 1706   SPECREQUEST  08/25/2023 1706    BOTTLES DRAWN AEROBIC ONLY Blood Culture results may not be optimal due to an inadequate volume of blood received in culture bottles   CULT  08/25/2023 1706    NO GROWTH 5 DAYS Performed at [facility], [address],  [place], [name] [address]    REPTSTATUS 08/30/2023 FINAL 08/25/2023 1706    Cardiac Enzymes: No results for input(s): "CKTOTAL", "CKMB", "CKMBINDEX", "TROPONINI" in the last 168 hours. CBG: Recent Labs  Lab 09/23/23 0044 09/23/23 0046 09/23/23 0143 09/23/23 0738 09/23/23 0911  GLUCAP 53* 58* 92 69* 86   Iron Studies: No results for input(s): "IRON", "TIBC", "TRANSFERRIN", "FERRITIN" in the last 72 hours. @lablastinr3 @ Studies/Results: No results found. Medications:  heparin sodium (porcine)     [START ON 09/24/2023] vancomycin      apixaban  5 mg Oral BID   bacitracin   Topical Daily   calcitRIOL  1.5 mcg Oral Q M,W,F-HD   Chlorhexidine Gluconate Cloth  6 each Topical Q0600   Chlorhexidine Gluconate Cloth  6 each Topical Q0600   darbepoetin (ARANESP) injection - DIALYSIS  100 mcg  Subcutaneous Q Sun-1800   dextrose  1 Tube Oral STAT   dextrose       gabapentin  100 mg Oral TID   insulin aspart  0-6 Units Subcutaneous TID AC & HS   loratadine  10 mg Oral Daily   metoprolol tartrate  25 mg Oral BID   pantoprazole  40 mg Oral Daily   povidone-Iodine   Topical Q0600   QUEtiapine  12.5 mg Oral q morning   QUEtiapine  25 mg Oral QHS   senna-docusate  2 tablet Oral BID   sevelamer carbonate  800 mg Oral TID WC   sodium zirconium cyclosilicate  10 g Oral BID

## 2023-09-23 NOTE — Progress Notes (Signed)
Physical Therapy Treatment Patient Details Name: [name] MRN: [id number] DOB: May 01, 1981 Today's Date: 09/23/2023   History of Present Illness 42 y.o. male presents to [facility] 09/09/23 as a transfer from [facility] for further management of endocarditis. Pt originally was admitted to [facility] 07/09/23 for DKA and sepsis after being found on the ground. 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 10/17 pt had a R MCA, L occipital, and B cerebellum CVA w/ septic emboli. ICU admit 10/18 w/ intubation 10/18-10/24. Pt was transferred to [facility] for MV repair on 11/29. At [facility], pt had cardiac cath w/ non obstructive disease, SAH, a craniotomy w/ abscess drainage on 12/4, and R third toe distal necrosis. PMH: chronic L AKA, MRSA, IDDM, ESRD on HD MWF, HTN, TEE positive for vegetation on multiple valves, cellulitis, medical noncompliance, narcotic dependence, DM with gastroparesis, L internal jugular DVT.    PT Comments  Pt received in supine, agreeable to therapy session after discussion on pt personal goals, pt with some emotional lability initially but agreeable to wanting to work on his mobility. Beginning of session focus on proper positioning in supine and benefits of keeping LUE elevated (pt mother called during session and also notified of positioning of LUE for edema mgmt), defer additional questions she had on cause of edema to MD as it is outside PTA scope. Pt needing up to max/totalA for rolling toward his R side and min to modA to roll toward his L side. Pt needs dense multimodal cues to use RUE for AAROM of his LUE and was observed removing pillow out from under LUE a few mins after discussion of LUE elevation and edema mgmt, will need reinforcement. Pt requesting to call for lunch at end of session and agreeable to work on seated balance and transfer training later in the day after lunch, mobility specialist notified. PTA brought HD recliner to his room in  anticipation of pt getting OOB to HD chair next date as tolerating HD in recliner is main limitation to discharge per chart review. Pt continues to benefit from PT services to progress toward functional mobility goals.    If plan is discharge home, recommend the following: Two people to help with walking and/or transfers;Assist for transportation;Supervision due to cognitive status;Help with stairs or ramp for entrance;Two people to help with bathing/dressing/bathroom;Assistance with cooking/housework;Direct supervision/assist for financial management;Direct supervision/assist for medications management   Can travel by private vehicle     No  Equipment Recommendations  Hoyer lift;Wheelchair (measurements PT);Wheelchair cushion (measurements PT);Other (comment) ([place] cushion)    Recommendations for Other Services       Precautions / Restrictions Precautions Precautions: Fall Precaution Comments: L AKA, L hemiparesis and edema, L neglect, bowel incontinence Restrictions Weight Bearing Restrictions Per Provider Order: Yes LLE Weight Bearing Per Provider Order: Weight bearing as tolerated     Mobility  Bed Mobility Overal bed mobility: Needs Assistance             General bed mobility comments: Decreased initiation by pt, cues for use of bed rail with RUE and PTA assist via bed pad for improved positioning in supine    Transfers                   General transfer comment: defer due to pt requesting to eat lunch prior to OOB and PTA being called to assist another pt with time sensitive transfer.    Ambulation/Gait  Stairs             Wheelchair Mobility     Tilt Bed    Modified Rankin (Stroke Patients Only)       Balance Overall balance assessment: Needs assistance Sitting-balance support: Single extremity supported, Feet supported Sitting balance-[name]: Poor Sitting balance - Comments: long sitting briefly heavy  reliance on rail and backward lean                                    Cognition Arousal: Alert Behavior During Therapy: WFL for tasks assessed/performed Overall Cognitive Status: Impaired/Different from baseline Area of Impairment: Attention, Memory, Following commands, Safety/judgement, Awareness, Problem solving                   Current Attention Level: Focused Memory: Decreased short-term memory Following Commands: Follows one step commands with increased time Safety/Judgement: Decreased awareness of safety, Decreased awareness of deficits Awareness: Intellectual Problem Solving: Slow processing, Difficulty sequencing, Requires verbal cues, Requires tactile cues General Comments: Verbose with poor attention, when pt cued to use RUE to assist LUE with repositioning, he will grasp limb for a few seconds then will let go, unable to attend to task more than a few seconds before impulsively reaching for bed rail or other area despite multimodal, repetitive cues. Severe L hemibody neglect still observed.        Exercises Other Exercises Other Exercises: supine single leg bridges with RLE activation and cues to keep RUE at his side to assist, x3 reps    General Comments General comments (skin integrity, edema, etc.): primary focus on educating pt and his mother on need for pt to keep LUE elevated with wrist above his elbow and elbow above heart to work on edema mgmt; RN and MD were again notified that he may benefit from LUE sling to assist in OOB mobility and due to flaccidity to protect integrity of his shoulder joint with transfers.      Pertinent Vitals/Pain Pain Assessment Pain Assessment: Faces Faces Pain Scale: No hurt Pain Intervention(s): Monitored during session, Repositioned     PT Goals (current goals can now be found in the care plan section) Acute Rehab PT Goals PT Goal Formulation: With patient Time For Goal Achievement: 09/24/23 Potential to  Achieve Goals: Fair Progress towards PT goals: Progressing toward goals    Frequency    Min 1X/week      PT Plan         AM-PAC PT "6 Clicks" Mobility   Outcome Measure  Help needed turning from your back to your side while in a flat bed without using bedrails?: A Lot Help needed moving from lying on your back to sitting on the side of a flat bed without using bedrails?: Total Help needed moving to and from a bed to a chair (including a wheelchair)?: Total Help needed standing up from a chair using your arms (e.g., wheelchair or bedside chair)?: Total Help needed to walk in hospital room?: Total Help needed climbing 3-5 steps with a railing? : Total 6 Click Score: 7    End of Session Equipment Utilized During Treatment: Gait belt Activity Tolerance: Patient limited by fatigue;Other (comment) (pt wanting to eat and calling downstairs to kitchen to put order in at end of session) Patient left: with call bell/phone within reach;in bed;with bed alarm set Nurse Communication: Mobility status;Need for lift equipment;Other (comment) (HD chair brought  to his room in anticipation of tomorrow's HD session) PT Visit Diagnosis: Other abnormalities of gait and mobility (R26.89);Other symptoms and signs involving the nervous system (R29.898);Hemiplegia and hemiparesis Hemiplegia - Right/Left: Left Hemiplegia - dominant/non-dominant: Non-dominant Hemiplegia - caused by: Cerebral infarction     Time: 1314-1330 PT Time Calculation (min) (ACUTE ONLY): 16 min  Charges:    $Neuromuscular Re-education: 8-22 mins PT General Charges $$ ACUTE PT VISIT: 1 Visit                     Lexxus Underhill P., PTA Acute Rehabilitation Services Secure Chat Preferred 9a-5:30pm Office: [phone]    Dorathy Kinsman [facility] 09/23/2023, 3:55 PM

## 2023-09-23 NOTE — Plan of Care (Signed)
  Problem: Nutrition: Goal: Adequate nutrition will be maintained Outcome: Progressing   Problem: Coping: Goal: Level of anxiety will decrease Outcome: Not Progressing   Problem: Pain Management: Goal: General experience of comfort will improve Outcome: Progressing   Problem: Skin Integrity: Goal: Risk for impaired skin integrity will decrease Outcome: Not Progressing

## 2023-09-23 NOTE — Progress Notes (Signed)
Pt BS-69 this morning, pt refuse dextrose gel, IV will not flush. Pt wants lemon-lime soda and grape juice instead. Pt is eating breakfast and will recheck blood sugar.

## 2023-09-23 NOTE — Progress Notes (Signed)
Case discussed with team this am. Plan for pt to receive HD in chair tomorrow to see how pt tolerates. Inpt HD unit staff aware of plans for HD in chair tomorrow and nephrologist to order. Will assist as needed.   Olivia Canter Renal Navigator [phone]

## 2023-09-23 NOTE — Plan of Care (Signed)
  Problem: Education: Goal: Knowledge of General Education information will improve Description: Including pain rating scale, medication(s)/side effects and non-pharmacologic comfort measures Outcome: Progressing   Problem: Health Behavior/Discharge Planning: Goal: Ability to manage health-related needs will improve Outcome: Progressing   Problem: Clinical Measurements: Goal: Ability to maintain clinical measurements within normal limits will improve Outcome: Progressing Goal: Will remain free from infection Outcome: Progressing Goal: Diagnostic test results will improve Outcome: Progressing Goal: Respiratory complications will improve Outcome: Progressing Goal: Cardiovascular complication will be avoided Outcome: Progressing   Problem: Activity: Goal: Risk for activity intolerance will decrease Outcome: Progressing   Problem: Nutrition: Goal: Adequate nutrition will be maintained Outcome: Progressing   Problem: Coping: Goal: Level of anxiety will decrease Outcome: Progressing   Problem: Elimination: Goal: Will not experience complications related to bowel motility Outcome: Progressing Goal: Will not experience complications related to urinary retention Outcome: Progressing   Problem: Pain Management: Goal: General experience of comfort will improve Outcome: Progressing   Problem: Safety: Goal: Ability to remain free from injury will improve Outcome: Progressing   Problem: Skin Integrity: Goal: Risk for impaired skin integrity will decrease Outcome: Progressing   Problem: Education: Goal: Ability to describe self-care measures that may prevent or decrease complications (Diabetes Survival Skills Education) will improve Outcome: Progressing Goal: Individualized Educational Video(s) Outcome: Progressing   Problem: Coping: Goal: Ability to adjust to condition or change in health will improve Outcome: Progressing   Problem: Fluid Volume: Goal: Ability to  maintain a balanced intake and output will improve Outcome: Progressing   Problem: Health Behavior/Discharge Planning: Goal: Ability to identify and utilize available resources and services will improve Outcome: Progressing Goal: Ability to manage health-related needs will improve Outcome: Progressing   Problem: Metabolic: Goal: Ability to maintain appropriate glucose levels will improve Outcome: Progressing   Problem: Nutritional: Goal: Maintenance of adequate nutrition will improve Outcome: Progressing Goal: Progress toward achieving an optimal weight will improve Outcome: Progressing   Problem: Skin Integrity: Goal: Risk for impaired skin integrity will decrease Outcome: Progressing   Problem: Tissue Perfusion: Goal: Adequacy of tissue perfusion will improve Outcome: Progressing

## 2023-09-24 ENCOUNTER — Other Ambulatory Visit: Payer: Self-pay

## 2023-09-24 ENCOUNTER — Inpatient Hospital Stay (HOSPITAL_COMMUNITY): Payer: Medicare HMO

## 2023-09-24 DIAGNOSIS — R7881 Bacteremia: Secondary | ICD-10-CM | POA: Diagnosis not present

## 2023-09-24 DIAGNOSIS — D631 Anemia in chronic kidney disease: Secondary | ICD-10-CM | POA: Diagnosis not present

## 2023-09-24 DIAGNOSIS — B9562 Methicillin resistant Staphylococcus aureus infection as the cause of diseases classified elsewhere: Secondary | ICD-10-CM | POA: Diagnosis not present

## 2023-09-24 DIAGNOSIS — Z992 Dependence on renal dialysis: Secondary | ICD-10-CM | POA: Diagnosis not present

## 2023-09-24 DIAGNOSIS — N250 Renal osteodystrophy: Secondary | ICD-10-CM | POA: Diagnosis not present

## 2023-09-24 DIAGNOSIS — I82629 Acute embolism and thrombosis of deep veins of unspecified upper extremity: Secondary | ICD-10-CM | POA: Diagnosis not present

## 2023-09-24 DIAGNOSIS — I120 Hypertensive chronic kidney disease with stage 5 chronic kidney disease or end stage renal disease: Secondary | ICD-10-CM | POA: Diagnosis not present

## 2023-09-24 DIAGNOSIS — I330 Acute and subacute infective endocarditis: Secondary | ICD-10-CM | POA: Diagnosis not present

## 2023-09-24 DIAGNOSIS — I38 Endocarditis, valve unspecified: Secondary | ICD-10-CM | POA: Diagnosis not present

## 2023-09-24 DIAGNOSIS — N186 End stage renal disease: Secondary | ICD-10-CM | POA: Diagnosis not present

## 2023-09-24 HISTORY — PX: IR US GUIDE VASC ACCESS RIGHT: IMG2390

## 2023-09-24 HISTORY — PX: IR RADIOLOGY PERIPHERAL GUIDED IV START: IMG5598

## 2023-09-24 LAB — CBC
HCT: 25.7 % — ABNORMAL LOW (ref 39.0–52.0)
Hemoglobin: 7.9 g/dL — ABNORMAL LOW (ref 13.0–17.0)
MCH: 28.2 pg (ref 26.0–34.0)
MCHC: 30.7 g/dL (ref 30.0–36.0)
MCV: 91.8 fL (ref 80.0–100.0)
Platelets: 181 10*3/uL (ref 150–400)
RBC: 2.8 MIL/uL — ABNORMAL LOW (ref 4.22–5.81)
RDW: 14.8 % (ref 11.5–15.5)
WBC: 9.8 10*3/uL (ref 4.0–10.5)
nRBC: 0 % (ref 0.0–0.2)

## 2023-09-24 LAB — GLUCOSE, CAPILLARY
Glucose-Capillary: 247 mg/dL — ABNORMAL HIGH (ref 70–99)
Glucose-Capillary: 264 mg/dL — ABNORMAL HIGH (ref 70–99)
Glucose-Capillary: 320 mg/dL — ABNORMAL HIGH (ref 70–99)
Glucose-Capillary: 159 mg/dL — ABNORMAL HIGH (ref 70–99)
Glucose-Capillary: 180 mg/dL — ABNORMAL HIGH (ref 70–99)

## 2023-09-24 LAB — MAGNESIUM: Magnesium: 2 mg/dL (ref 1.7–2.4)

## 2023-09-24 LAB — RENAL FUNCTION PANEL
Albumin: 2.6 g/dL — ABNORMAL LOW (ref 3.5–5.0)
Anion gap: 21 — ABNORMAL HIGH (ref 5–15)
BUN: 66 mg/dL — ABNORMAL HIGH (ref 6–20)
CO2: 16 mmol/L — ABNORMAL LOW (ref 22–32)
Calcium: 9.2 mg/dL (ref 8.9–10.3)
Chloride: 94 mmol/L — ABNORMAL LOW (ref 98–111)
Creatinine, Ser: 7.71 mg/dL — ABNORMAL HIGH (ref 0.61–1.24)
GFR, Estimated: 8 mL/min — ABNORMAL LOW (ref >60)
Glucose, Bld: 285 mg/dL — ABNORMAL HIGH (ref 70–99)
Phosphorus: 11.3 mg/dL — ABNORMAL HIGH (ref 2.5–4.6)
Potassium: >7.5 mmol/L (ref 3.5–5.1)
Sodium: 131 mmol/L — ABNORMAL LOW (ref 135–145)

## 2023-09-24 MED ORDER — ANTICOAGULANT SODIUM CITRATE 4% (200MG/5ML) IV SOLN: 5.0000 mL | Status: DC | PRN

## 2023-09-24 MED ORDER — ONDANSETRON HCL 4 MG/2ML IJ SOLN
4.0000 mg | Freq: Four times a day (QID) | INTRAMUSCULAR | Status: DC | PRN
  Administered 2023-09-24: 4 mg via INTRAVENOUS
  Filled 2023-09-24: qty 2

## 2023-09-24 MED ORDER — SODIUM CHLORIDE 0.9 % IV BOLUS
250.0000 mL | Freq: Once | INTRAVENOUS | Status: AC
  Administered 2023-09-24: 250 mL via INTRAVENOUS

## 2023-09-24 MED ORDER — LIDOCAINE-PRILOCAINE 2.5-2.5 % EX CREA: 1.0000 | TOPICAL_CREAM | CUTANEOUS | Status: DC | PRN

## 2023-09-24 MED ORDER — PENTAFLUOROPROP-TETRAFLUOROETH EX AERO: 1.0000 | INHALATION_SPRAY | CUTANEOUS | Status: DC | PRN

## 2023-09-24 MED ORDER — ALTEPLASE 2 MG IJ SOLR: 2.0000 mg | Freq: Once | INTRAMUSCULAR | Status: DC | PRN

## 2023-09-24 MED ORDER — DEXTROSE 50 % IV SOLN
1.0000 | Freq: Once | INTRAVENOUS | Status: AC
  Administered 2023-09-24: 50 mL via INTRAVENOUS
  Filled 2023-09-24: qty 50

## 2023-09-24 MED ORDER — CALCIUM GLUCONATE-NACL 1-0.675 GM/50ML-% IV SOLN
1.0000 g | Freq: Once | INTRAVENOUS | Status: AC
  Administered 2023-09-24: 1000 mg via INTRAVENOUS
  Filled 2023-09-24: qty 50

## 2023-09-24 MED ORDER — HEPARIN SODIUM (PORCINE) 1000 UNIT/ML DIALYSIS: 1000.0000 [IU] | INTRAMUSCULAR | Status: DC | PRN

## 2023-09-24 MED ORDER — ALBUMIN HUMAN 25 % IV SOLN
25.0000 g | Freq: Two times a day (BID) | INTRAVENOUS | Status: DC | PRN
  Administered 2023-09-24 – 2023-10-21 (×10): 25 g via INTRAVENOUS
  Filled 2023-09-24: qty 100

## 2023-09-24 MED ORDER — INSULIN ASPART 100 UNIT/ML IJ SOLN
10.0000 [IU] | Freq: Once | INTRAMUSCULAR | Status: AC
  Administered 2023-09-24: 10 [IU] via INTRAVENOUS

## 2023-09-24 MED ORDER — SODIUM CHLORIDE (PF) 0.9 % IJ SOLN: 10.0000 [IU] | Freq: Once | INTRAMUSCULAR | Status: DC

## 2023-09-24 MED ORDER — SEVELAMER CARBONATE 800 MG PO TABS
1600.0000 mg | ORAL_TABLET | Freq: Three times a day (TID) | ORAL | Status: DC
  Administered 2023-09-25 – 2023-10-07 (×26): 1600 mg via ORAL
  Filled 2023-09-24 (×30): qty 2

## 2023-09-24 MED ORDER — INFLUENZA VIRUS VACC SPLIT PF (FLUZONE) 0.5 ML IM SUSY
0.5000 mL | PREFILLED_SYRINGE | INTRAMUSCULAR | Status: DC
  Filled 2023-09-24: qty 0.5

## 2023-09-24 MED ORDER — NEPRO/CARBSTEADY PO LIQD
237.0000 mL | ORAL | Status: DC | PRN
  Filled 2023-09-24: qty 237

## 2023-09-24 NOTE — Progress Notes (Signed)
Occupational Therapy Treatment Patient Details Name: [name] MRN: [id number] DOB: [birth year] Today's Date: 09/24/2023   History of present illness 42 y.o. male presents to [facility] 09/09/23 as a transfer from [facility] for further management of endocarditis. Pt originally was admitted to [facility] 07/09/23 for DKA and sepsis after being found on the ground. 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 10/17 pt had a R MCA, L occipital, and B cerebellum CVA w/ septic emboli. ICU admit 10/18 w/ intubation 10/18-10/24. Pt was transferred to [facility] for MV repair on 11/29. At [facility], pt had cardiac cath w/ non obstructive disease, SAH, a craniotomy w/ abscess drainage on 12/4, and R third toe distal necrosis. PMH: chronic L AKA, MRSA, IDDM, ESRD on HD MWF, HTN, TEE positive for vegetation on multiple valves, cellulitis, medical noncompliance, narcotic dependence, DM with gastroparesis, L internal jugular DVT.   OT comments  Patient received in supine and agreeable to OT/PT session. Gown change performed while in supine and sock donned. Patient instructed on side lying to sitting on EOB requiring max assist +2 to perform and min to mod assist for sitting balance on EOB. LUE sling donned while seated on EOB. Patient performed 2 stands from EOB into [place] with max assist +2. On first attempt patient demonstrated heavy forward leaning and support with use of bed pad for hips. On second attempt patient stood more upright and for increased time. Patient assisted back to supine and patient demonstrated tremors with BP 102/48 (60) and HR 116. Patient will benefit from continued inpatient follow up therapy, <3 hours/day for further OT treatment to address bed mobility, transfers, and self care. Acute OT to continue to follow to address transfers, standing, and self care.       If plan is discharge home, recommend the following:  Two people to help with walking and/or transfers;A lot of  help with bathing/dressing/bathroom;Assistance with cooking/housework;Assistance with feeding;Help with stairs or ramp for entrance;Assist for transportation;Direct supervision/assist for financial management;Direct supervision/assist for medications management   Equipment Recommendations  Other (comment);Hoyer lift (hospital bed [facility])    Recommendations for Other Services      Precautions / Restrictions Precautions Precautions: Fall Precaution Comments: L AKA, L hemiparesis and edema, L neglect, bowel incontinence Restrictions Weight Bearing Restrictions Per Provider Order: Yes LLE Weight Bearing Per Provider Order: Weight bearing as tolerated Other Position/Activity Restrictions: LLE prosthetic in his room, has sling for LUE       Mobility Bed Mobility Overal bed mobility: Needs Assistance Bed Mobility: Rolling, Sidelying to Sit, Sit to Sidelying Rolling: Max assist Sidelying to sit: Max assist, +2 for physical assistance     Sit to sidelying: Max assist, +2 for physical assistance General bed mobility comments: Patient requiring increased assistance on this date with rolling, getting to EOB and returning to supine    Transfers Overall transfer level: Needs assistance Equipment used: Ambulation equipment used Transfers: Sit to/from Stand Sit to Stand: Max assist, +2 physical assistance, Via lift equipment           General transfer comment: Patient performed 2 stands from [place] with max assist +2. Patient demonstrating partial stand on first attempt and more upright on 2nd attempt Transfer via Lift Equipment: Stedy   Balance Overall balance assessment: Needs assistance Sitting-balance support: Single extremity supported, Feet supported Sitting balance-[name]: Poor Sitting balance - Comments: required min to mod assist for sitting balance on EOB Postural control: Right lateral lean Standing balance support:  Bilateral upper extremity supported, During functional  activity Standing balance-[name]: Poor Standing balance comment: Patient stood x2 into [place] with max assist +2 to stand with use of bed pad.  Patient reliant on therapist and Antony Salmon for support while standing                           ADL either performed or assessed with clinical judgement   ADL Overall ADL's : Needs assistance/impaired     Grooming: Set up;Wash/dry face       Lower Body Bathing: Maximal assistance;+2 for physical assistance;Sit to/from stand Lower Body Bathing Details (indicate cue type and reason): Patient stood in [place] with max assist +2 with nursing assisting with cleaning buttocks Upper Body Dressing : Maximal assistance;Bed level Upper Body Dressing Details (indicate cue type and reason): to change gown Lower Body Dressing: Total assistance Lower Body Dressing Details (indicate cue type and reason): to don R sock                    Extremity/Trunk Assessment              Vision       Perception     Praxis      Cognition Arousal: Lethargic Behavior During Therapy: WFL for tasks assessed/performed Overall Cognitive Status: Impaired/Different from baseline Area of Impairment: Attention, Memory, Following commands, Safety/judgement, Awareness, Problem solving                   Current Attention Level: Focused Memory: Decreased short-term memory Following Commands: Follows one step commands with increased time Safety/Judgement: Decreased awareness of safety, Decreased awareness of deficits Awareness: Intellectual Problem Solving: Slow processing, Difficulty sequencing, Requires verbal cues, Requires tactile cues General Comments: not very talkative, stating he was tired. Demontrated tremors when returning to supine        Exercises      Shoulder Instructions       General Comments BP once back in supine 102/48 (60) HR 116    Pertinent Vitals/ Pain       Pain Assessment Pain Assessment: Faces Faces Pain  Scale: No hurt Pain Intervention(s): Monitored during session  Home Living                                          Prior Functioning/Environment              Frequency  Min 1X/week        Progress Toward Goals  OT Goals(current goals can now be found in the care plan section)  Progress towards OT goals: Progressing toward goals  Acute Rehab OT Goals Patient Stated Goal: stand better OT Goal Formulation: With patient Time For Goal Achievement: 09/28/23 Potential to Achieve Goals: Good ADL Goals Pt Will Perform Grooming: with set-up;sitting Pt Will Perform Lower Body Bathing: with mod assist;sit to/from stand Pt Will Perform Lower Body Dressing: sitting/lateral leans;with max assist Pt Will Transfer to Toilet: with max assist;with transfer board;bedside commode Pt Will Perform Toileting - Clothing Manipulation and hygiene: with mod assist;sitting/lateral leans Pt/caregiver will Perform Home Exercise Program: Increased ROM;Left upper extremity Additional ADL Goal #1: pt will complete bed mobility rolling R and L min (A) Additional ADL Goal #2: pt will progress to eob sitting from [facility] 20 degrees with bed rail min (A) Additional ADL Goal #3:  pt will complete basic transfer with sliding board total +2 mod (A)  Plan      Co-evaluation    PT/OT/SLP Co-Evaluation/Treatment: Yes Reason for Co-Treatment: For patient/therapist safety;To address functional/ADL transfers;Complexity of the patient's impairments (multi-system involvement)   OT goals addressed during session: ADL's and self-care;Strengthening/ROM      AM-PAC OT "6 Clicks" Daily Activity     Outcome Measure   Help from another person eating meals?: A Little Help from another person taking care of personal grooming?: A Lot Help from another person toileting, which includes using toliet, bedpan, or urinal?: Total Help from another person bathing (including washing, rinsing, drying)?:  Total Help from another person to put on and taking off regular upper body clothing?: Total Help from another person to put on and taking off regular lower body clothing?: Total 6 Click Score: 9    End of Session Equipment Utilized During Treatment: Other (comment);Gait belt (Stedy)  OT Visit Diagnosis: Other abnormalities of gait and mobility (R26.89);Other symptoms and signs involving the nervous system (R29.898);Hemiplegia and hemiparesis;Muscle weakness (generalized) (M62.81);Other symptoms and signs involving cognitive function Hemiplegia - Right/Left: Left Hemiplegia - dominant/non-dominant: Non-Dominant Hemiplegia - caused by: Cerebral infarction   Activity Tolerance Patient limited by lethargy   Patient Left in bed;with call bell/phone within reach;with bed alarm set   Nurse Communication Mobility status;Other (comment) (Patient demonstrating tremors once back in supine)        Time: 1610-9604 OT Time Calculation (min): 34 min  Charges: OT General Charges $OT Visit: 1 Visit OT Treatments $Self Care/Home Management : 8-22 mins  Alfonse Flavors, OTA Acute Rehabilitation Services  Office [phone]   Dewain Penning 09/24/2023, 1:36 PM

## 2023-09-24 NOTE — Procedures (Signed)
Interventional Radiology Procedure Note  Procedure: Placement of a right basilic vein venipuncture. Bedside in HD unit.   Complications: None  Recommendations:  - brace on right elbow to keep straight - Do not submerge - Routine IV care   Signed,  Yvone Neu. Loreta Ave, DO, ABVM, RPVI

## 2023-09-24 NOTE — Plan of Care (Signed)
  Problem: Education: Goal: Knowledge of General Education information will improve Description: Including pain rating scale, medication(s)/side effects and non-pharmacologic comfort measures Outcome: Progressing   Problem: Health Behavior/Discharge Planning: Goal: Ability to manage health-related needs will improve Outcome: Progressing   Problem: Clinical Measurements: Goal: Ability to maintain clinical measurements within normal limits will improve Outcome: Progressing Goal: Will remain free from infection Outcome: Progressing Goal: Diagnostic test results will improve Outcome: Progressing Goal: Respiratory complications will improve Outcome: Progressing Goal: Cardiovascular complication will be avoided Outcome: Progressing   Problem: Activity: Goal: Risk for activity intolerance will decrease Outcome: Progressing   Problem: Nutrition: Goal: Adequate nutrition will be maintained Outcome: Progressing   Problem: Coping: Goal: Level of anxiety will decrease Outcome: Progressing   Problem: Elimination: Goal: Will not experience complications related to bowel motility Outcome: Progressing Goal: Will not experience complications related to urinary retention Outcome: Progressing   Problem: Pain Management: Goal: General experience of comfort will improve Outcome: Progressing   Problem: Safety: Goal: Ability to remain free from injury will improve Outcome: Progressing   Problem: Skin Integrity: Goal: Risk for impaired skin integrity will decrease Outcome: Progressing   Problem: Education: Goal: Ability to describe self-care measures that may prevent or decrease complications (Diabetes Survival Skills Education) will improve Outcome: Progressing Goal: Individualized Educational Video(s) Outcome: Progressing   Problem: Coping: Goal: Ability to adjust to condition or change in health will improve Outcome: Progressing   Problem: Fluid Volume: Goal: Ability to  maintain a balanced intake and output will improve Outcome: Progressing   Problem: Health Behavior/Discharge Planning: Goal: Ability to identify and utilize available resources and services will improve Outcome: Progressing Goal: Ability to manage health-related needs will improve Outcome: Progressing   Problem: Metabolic: Goal: Ability to maintain appropriate glucose levels will improve Outcome: Progressing   Problem: Nutritional: Goal: Maintenance of adequate nutrition will improve Outcome: Progressing Goal: Progress toward achieving an optimal weight will improve Outcome: Progressing   Problem: Skin Integrity: Goal: Risk for impaired skin integrity will decrease Outcome: Progressing   Problem: Tissue Perfusion: Goal: Adequacy of tissue perfusion will improve Outcome: Progressing

## 2023-09-24 NOTE — Progress Notes (Signed)
Orthopedic Tech Progress Note Patient Details:  [name] 03-May-1981 [id number]  Ortho Devices Type of Ortho Device: Shoulder immobilizer Ortho Device/Splint Location: LUE Ortho Device/Splint Interventions: Ordered  Left at bedside with nurse. Elbow immobilizer ordered.    [name] 09/24/2023, 6:39 PM

## 2023-09-24 NOTE — Progress Notes (Signed)
Patient arrived to 3E21 from HD which was transferred from [facility]. Patient expressed that there were 3 men that picked him up to transferred him to the chair from bed and request for some male nurses. There are no male nurses available at this time. Staffs offered to transfer pt back to bed using hoyer lift which patient declined. Patient continue to refuse all options and care offered by staffs at this time. MD, AC, and CN notified. Patient is currently sitting in HD chair.

## 2023-09-24 NOTE — Progress Notes (Signed)
Physical Therapy Treatment Patient Details Name: [name] MRN: [id number] DOB: 07/27/1981 Today's Date: 09/24/2023   History of Present Illness 42 y.o. male presents to [facility] 09/09/23 as a transfer from [facility] for further management of endocarditis. Pt originally was admitted to [facility] 07/09/23 for DKA and sepsis after being found on the ground. 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 10/17 pt had a R MCA, L occipital, and B cerebellum CVA w/ septic emboli. ICU admit 10/18 w/ intubation 10/18-10/24. Pt was transferred to [facility] for MV repair on 11/29. At [facility], pt had cardiac cath w/ non obstructive disease, SAH, a craniotomy w/ abscess drainage on 12/4, and R third toe distal necrosis. PMH: chronic L AKA, MRSA, IDDM, ESRD on HD MWF, HTN, TEE positive for vegetation on multiple valves, cellulitis, medical noncompliance, narcotic dependence, DM with gastroparesis, L internal jugular DVT.    PT Comments  Pt received in supine, awake but more lethargic this date and RN clearance for session with co-tx with OTA given pt fatigue and multidisciplinary therapy needs. Pt agreeable to working on seated/standing transfers and balance with encouragement, and needing increased physical assist for all aspects of mobility this date, possibly due to elevated potassium. Pt needing up to +2 maxA for sit<>stand to [facility] platform lift, LUE with sling donned while mobilizing and pt with improved standing tolerance this date with sling on, up to ~60 seconds with RN performed posterior hygiene assist and bed linens changed under him. After second standing trial, pt c/o increased fatigue and returned to supine (after BP assessment which was fair sitting EOB, see VS in comments below), pt BP has been soft prior to session as well but [facility]. Pt with episode of UE/LE tremors but awake and verbally responding, which lasted a couple mins, RN present and to notify care team of episode, RN  also placing telemetry leads after pt returned to supine. Pt will continue to benefit from skilled rehab in a post acute setting < 3 hours per day to maximize functional gains before returning home, pt progressing slowly toward his goals and will be due for PT goal update next session.   If plan is discharge home, recommend the following: Two people to help with walking and/or transfers;Assist for transportation;Supervision due to cognitive status;Help with stairs or ramp for entrance;Two people to help with bathing/dressing/bathroom;Assistance with cooking/housework;Direct supervision/assist for financial management;Direct supervision/assist for medications management   Can travel by private vehicle     No  Equipment Recommendations  Hoyer lift;Wheelchair (measurements PT);Wheelchair cushion (measurements PT);Other (comment) (roho cushion; would be good to have wheelchair with auto-pressure relief function and one-arm drive vs electric wheelchair pending progress)    Recommendations for Other Services       Precautions / Restrictions Precautions Precautions: Fall Precaution Comments: Contact precs; L AKA, L hemiparesis and edema, L neglect, bowel incontinence Restrictions Weight Bearing Restrictions Per Provider Order: Yes LLE Weight Bearing Per Provider Order: Weight bearing as tolerated Other Position/Activity Restrictions: Pt has sling for LUE when mobilizing OOB; LLE prosthetic not fitting     Mobility  Bed Mobility Overal bed mobility: Needs Assistance Bed Mobility: Rolling, Sidelying to Sit, Sit to Sidelying Rolling: Max assist Sidelying to sit: Max assist, +2 for physical assistance, HOB elevated, Used rails     Sit to sidelying: +2 for physical assistance, Total assist General bed mobility comments: Patient requiring increased assistance on this date with rolling, getting to EOB and especially with return to  supine due to pt increased c/o fatigue and some UE/LE tremors after  return to supine from sitting EOB.    Transfers Overall transfer level: Needs assistance Equipment used: Ambulation equipment used Transfers: Sit to/from Stand Sit to Stand: Max assist, +2 physical assistance, Via lift equipment, From elevated surface           General transfer comment: Patient performed 2 stands from [place] with max assist +2. Patient demonstrating partial stand on first attempt and more upright on 2nd attempt, but quick to fatigue, pt able to stand ~60 seconds on second trial with consistent +2 lift assist while bed linens changed under him and RN performed posterior peri-care and pt supported by therapists Transfer via Lift Equipment: Stedy  Ambulation/Gait                   Stairs             Wheelchair Mobility     Tilt Bed    Modified Rankin (Stroke Patients Only)       Balance Overall balance assessment: Needs assistance Sitting-balance support: Single extremity supported, Feet supported Sitting balance-[name]: Poor Sitting balance - Comments: required mod to maxA assist for sitting balance on EOB despite single UE support, pt with lean toward his weaker L side throughout sitting Postural control: Left lateral lean Standing balance support: During functional activity, Single extremity supported (LUE sling, RUE on Stedy rail) Standing balance-[name]: Zero Standing balance comment: +2 maxA, pt maintains ~60 seconds at most (on second trial)                            Cognition Arousal: Lethargic Behavior During Therapy: WFL for tasks assessed/performed Overall Cognitive Status: Impaired/Different from baseline Area of Impairment: Attention, Memory, Following commands, Safety/judgement, Awareness, Problem solving                   Current Attention Level: Focused Memory: Decreased short-term memory Following Commands: Follows one step commands with increased time Safety/Judgement: Decreased awareness of  safety, Decreased awareness of deficits Awareness: Intellectual Problem Solving: Slow processing, Difficulty sequencing, Requires verbal cues, Requires tactile cues, Decreased initiation General Comments: Pt was not very talkative this date compared with previous day's session. Mr Timmermann stating he was tired. Demontrated tremors when returning to supine, but was still verbally responsive at that time with some delay responding while sitting EOB and in supine.        Exercises      General Comments General comments (skin integrity, edema, etc.): BP sitting EOB 95/62 HR ~107 bpm; BP once back in supine 102/48 (60) HR 116, pt with some BUE/RLE tremor type movements but still maintains eyes open and pt verbally responding to RN and therapists, with some delay. Possible slight facial tremors as well; RN to notify MD.      Pertinent Vitals/Pain Pain Assessment Pain Assessment: Faces Faces Pain Scale: Hurts a little bit Pain Location: intermittent grimacing but no specific complaint of pain Pain Descriptors / Indicators: Grimacing Pain Intervention(s): Monitored during session, Repositioned, Limited activity within patient's tolerance    Home Living                          Prior Function            PT Goals (current goals can now be found in the care plan section) Acute Rehab PT Goals Patient Stated  Goal: to get more therapy and gain independence PT Goal Formulation: With patient Time For Goal Achievement: 09/24/23 Progress towards PT goals: Progressing toward goals (slow progress today, likely related to elevated potassium levels)    Frequency    Min 1X/week      PT Plan      Co-evaluation PT/OT/SLP Co-Evaluation/Treatment: Yes Reason for Co-Treatment: For patient/therapist safety;To address functional/ADL transfers;Complexity of the patient's impairments (multi-system involvement) PT goals addressed during session: Mobility/safety with mobility;Balance;Proper  use of DME;Strengthening/ROM OT goals addressed during session: ADL's and self-care;Strengthening/ROM      AM-PAC PT "6 Clicks" Mobility   Outcome Measure  Help needed turning from your back to your side while in a flat bed without using bedrails?: A Lot (total to R at times, mod/maxA to his L side) Help needed moving from lying on your back to sitting on the side of a flat bed without using bedrails?: Total Help needed moving to and from a bed to a chair (including a wheelchair)?: Total Help needed standing up from a chair using your arms (e.g., wheelchair or bedside chair)?: Total Help needed to walk in hospital room?: Total Help needed climbing 3-5 steps with a railing? : Total 6 Click Score: 7    End of Session Equipment Utilized During Treatment: Gait belt;Other (comment) (Stedy, LUE sling) Activity Tolerance: Patient limited by fatigue;Patient limited by lethargy;Treatment limited secondary to medical complications (Comment);Other (comment) (body tremors toward end of session, RN present and aware, pt verbally responding during episode and eyes stayed open but pt not always tracking with eyes. Lasted <4 mins.) Patient left: in bed;with call bell/phone within reach;with bed alarm set;with nursing/sitter in room;Other (comment) (pt positioned with HOB >30* for aspiration prevention and LUE supported by pillows w/wrist above elbow.) Nurse Communication: Mobility status;Need for lift equipment;Other (comment) (tremors/increased fatigue) PT Visit Diagnosis: Other abnormalities of gait and mobility (R26.89);Other symptoms and signs involving the nervous system (R29.898);Hemiplegia and hemiparesis Hemiplegia - Right/Left: Left Hemiplegia - dominant/non-dominant: Non-dominant Hemiplegia - caused by: Cerebral infarction     Time: 1610-9604 PT Time Calculation (min) (ACUTE ONLY): 34 min  Charges:    $Therapeutic Activity: 8-22 mins PT General Charges $$ ACUTE PT VISIT: 1 Visit                      Deloma Spindle P., PTA Acute Rehabilitation Services Secure Chat Preferred 9a-5:30pm Office: [phone]    Dorathy Kinsman [facility] 09/24/2023, 2:32 PM

## 2023-09-24 NOTE — Inpatient Diabetes Management (Signed)
Inpatient Diabetes Program Recommendations  AACE/ADA: New Consensus Statement on Inpatient Glycemic Control (2015)  Target Ranges:  Prepandial:   less than 140 mg/dL      Peak postprandial:   less than 180 mg/dL (1-2 hours)      Critically ill patients:  140 - 180 mg/dL   Lab Results  Component Value Date   GLUCAP 264 (H) 09/24/2023   HGBA1C 8.9 (H) 07/10/2023    Review of Glycemic Control  Latest Reference Range & Units 09/23/23 09:11 09/23/23 11:57 09/23/23 16:48 09/23/23 21:48 09/23/23 23:25 09/24/23 03:03 09/24/23 08:27  Glucose-Capillary 70 - 99 mg/dL 86 782 (H) 956 (H) 213 (H) 246 (H) 247 (H) 264 (H)  (H): Data is abnormally high  Diabetes history: DM1 Outpatient Diabetes medications: Lantus 4 units daily, Humalog 0-4 units TID with meals Current orders for Inpatient glycemic control: Semglee 4 units daily, Novolog 0-6 units AC&HS  Inpatient Diabetes Program Recommendations:    Semglee 3 units every day Novolog 2 units TID if he eats at least 50%  Will continue to follow while inpatient.  Thank you, Dulce Sellar, MSN, CDCES Diabetes Coordinator Inpatient Diabetes Program [phone] (team pager from 8a-5p)

## 2023-09-24 NOTE — Progress Notes (Signed)
$  110, cell phone, and tablet transferred to 3E21 with pt. Left at bedside as instructed by pt.

## 2023-09-24 NOTE — Progress Notes (Signed)
Pharmacy Antibiotic Note  [name] is a 42 y.o. 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 Pt was transferred to [facility] for MV repair 11/29 and craniotomy with abscess drainage on 12/4. Pharmacy has been consulted for continued Vancomycin dosing.  Spoke with pharmacist at [facility], vancomycin was being dose based on pre-HD levels - pt mostly getting 750mg  with HD but occasionally would get 500mg  instead. Last vanc random (pre-HD level) at [facility] of 19.8 mcg/ml on 12/11 (therapeutic for goal 15-25 mcg/ml) and pt received vanc 750mg  IV 12/11 pm post HD  12/24 AM update: - random level 27 mcg/mL on 12/24  Plan: - Omit vanco dose 12/24 f/b Change Vancomycin 500 mg IV post HD MWF through 10/27/23 - Will monitor HD tolerance and dosing - F/u ID recommendations  Height: 5\' 7"  (170.2 cm) Weight: 84.3 kg (185 lb 13.6 oz) IBW/kg (Calculated) : 66.1  Temp (24hrs), Avg:98.4 F (36.9 C), Min:98.1 F (36.7 C), Max:98.7 F (37.1 C)  Recent Labs  Lab 09/19/23 2030 09/20/23 1258 09/21/23 0620 09/21/23 1020 09/22/23 0559 09/23/23 0633  WBC 6.3 7.3 6.4  --   --   --   CREATININE 4.02* 4.89* 5.80*  --  5.44* 6.45*  VANCORANDOM  --   --   --  27  --   --     Estimated Creatinine Clearance: 15.2 mL/min (A) (by C-G formula based on SCr of 6.45 mg/dL (H)).    No Known Allergies  Antimicrobials this admission: Vancomycin 10/12 >>(10/27/23) Cefepime 10/21 >> 10/23; restart 10/25 >> 10/29 Linezolid 11/27 >> 12/10   Vancomycin levels  -10/17 preHD>>25 -10/24: VR= 16 -11/1: VR= 24 -11/8 VR 22 -11/15 VR 22  -11/22 VR 28 - held dose x1, decrease to 500mg  -11/28 VR 18 - cont 500mg  MWF 11/29 - Transferred to [facility] -12/11 VR 19.8 (at [facility]) - mostly 750mg  with HD (occasionally 500mg  instead) - 12/16 VR 25 mcg/mL- continue 750 mg iv MWF - 12/24 VR 27 mcg/mL- hold 1 dose / reduce to 500 mg iv MWF thereafter   Microbiology results: 10/12  MRSA PCR >> positive 10/12 CDiff antigen +, toxin -, PCR - >> negative 10/12 BCx >> 4/4 MRSA 10/13 BCx >> Negative 10/17 BCx >> Negative 10/21 RCx >> Negative 11/26 Bcx >> negative 11/27 Bcx >> Negative   [name] BS, PharmD, BCPS Clinical Pharmacist 09/24/2023 8:05 AM  Contact: [phone] after 3 PM  "Be curious, not judgmental..." -Debbora Dus

## 2023-09-24 NOTE — Progress Notes (Signed)
[facility] Progress Note   OP HD: [facility] MWF  4h   400/1.5  77kg  L fem TDC 2/3 bath  Heparin none   Assessment/ Plan: 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 Not a candidate for surgery here. SP line holiday w/ new TDC placed 10/18. Continues on IV vanc upon return from [place] . Adjusted plan per ID pharm is 750mg  MWF through 10/27/23.  Hyperkalemia - Pt not adhering to renal diet though sign on door with specific instructions for dietary.  Lokelma has been increased to BID.  I added a 3rd dose yesterday but I see it was cancelled.  This AM K > 7.5 - rec'd lokelma, IV ca.  Will do dialysis as soon as possible.  Reiterated to pt again re: diet!  Follow daily labs.  SP I&D brain abscess: drained 12/04 at [facility]. Mentation improved. 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 Next HD today; on and off early on Tuesday we are only able to get 1.2 L off.    Volume: on RA, L arm + edema, no other edema. UF as tolerated with HD Anemia: Hb 8-9s, on ESA. Hold iron infusion while on IV ABX. 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 Cont binders w/ meals.  Hypertension: BP on low side recently, on low dose BB.   HD access:  L  femoral TDC placed on 10/18. 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 Type 1 DM: per primary Dispo - cont efforts to dialyze in chair.  Appears SNF being pursued and if pt declines formal psych eval to be pursued.  Subjective: c/o mild dyspnea this AM but O2 sats ok on RA.  K > 7.5 this AM.  Admits to eating potatoes last night.   Objective Vitals:   09/23/23 2034 09/23/23 2324 09/24/23 0441 09/24/23 0734  BP: (!) 92/50 103/80 (!) 117/56 101/65  Pulse: (!) 120 (!) 125 88 (!) 113  Resp: 20 18 18 16   Temp: 98.7 F (37.1 C) 98.3 F (36.8 C) 98.7 F (37.1 C) 98.4 F (36.9 C)  TempSrc: Oral Oral Oral Oral  SpO2: 90% 97% 96% 98%  Weight:      Height:        Physical Exam General: Chronically ill appearing male in NAD, calm and cooperative Heart:S1,S2 RRR No M/R/G Lungs: CTAB no WOB on RA Abdomen: NABS, NT Extremities: L AKA No RLE edema. Edema LUE Dialysis Access: L femoral TDC drsg intact  Additional Objective Labs: Basic Metabolic Panel: Recent Labs  Lab 09/22/23 0559 09/23/23 0633 09/24/23 0736  NA 134* 136 131*  K 5.8* 6.0* >7.5*  CL 96* 97* 94*  CO2 24 26 16*  GLUCOSE 355* 72 285*  BUN 41* 50* 66*  CREATININE 5.44* 6.45* 7.71*  CALCIUM 9.0 9.2 9.2  PHOS 8.2* 8.9* 11.3*   Liver Function Tests: Recent Labs  Lab 09/22/23 0559 09/23/23 0633 09/24/23 0736  ALBUMIN 2.6* 2.6* 2.6*   No results for input(s): "LIPASE", "AMYLASE" in the last 168 hours. CBC: Recent Labs  Lab 09/19/23 2030 09/20/23 1258 09/21/23 0620  WBC 6.3 7.3 6.4  NEUTROABS 4.6 5.1 4.2  HGB 8.5* 8.3* 9.1*  HCT 27.7* 27.1* 29.3*  MCV 94.2 94.1 92.7  PLT 187 203 189   Blood Culture    Component Value Date/Time   SDES BLOOD SITE NOT SPECIFIED 08/25/2023 1706   SPECREQUEST  08/25/2023 1706  BOTTLES DRAWN AEROBIC ONLY Blood Culture results may not be optimal due to an inadequate volume of blood received in culture bottles   CULT  08/25/2023 1706    NO GROWTH 5 DAYS Performed at [facility], [address], [place], [name] [address]    REPTSTATUS 08/30/2023 FINAL 08/25/2023 1706    Cardiac Enzymes: No results for input(s): "CKTOTAL", "CKMB", "CKMBINDEX", "TROPONINI" in the last 168 hours. CBG: Recent Labs  Lab 09/23/23 1648 09/23/23 2148 09/23/23 2325 09/24/23 0303 09/24/23 0827  GLUCAP 125* 198* 246* 247* 264*   Iron Studies: No results for input(s): "IRON", "TIBC", "TRANSFERRIN", "FERRITIN" in the last 72 hours. @lablastinr3 @ Studies/Results: No results found. Medications:  calcium gluconate     heparin sodium (porcine)     vancomycin      apixaban  5 mg Oral BID   bacitracin   Topical Daily   calcitRIOL  1.5 mcg  Oral Q M,W,F-HD   Chlorhexidine Gluconate Cloth  6 each Topical Q0600   Chlorhexidine Gluconate Cloth  6 each Topical Q0600   darbepoetin (ARANESP) injection - DIALYSIS  100 mcg Subcutaneous Q Sun-1800   gabapentin  100 mg Oral TID   Gerhardt's butt cream   Topical BID   insulin aspart  0-6 Units Subcutaneous TID AC & HS   loratadine  10 mg Oral Daily   metoprolol tartrate  25 mg Oral BID   pantoprazole  40 mg Oral Daily   povidone-Iodine   Topical Q0600   QUEtiapine  12.5 mg Oral q morning   QUEtiapine  25 mg Oral QHS   senna-docusate  2 tablet Oral BID   sevelamer carbonate  1,600 mg Oral TID WC   sodium zirconium cyclosilicate  10 g Oral BID

## 2023-09-24 NOTE — Progress Notes (Signed)
PICC order received. GFR of 8. Per Dr. Glenna Fellows (nephrologist), no PICC placement at this time. Attending MD aware. Will cancel the order.

## 2023-09-24 NOTE — Progress Notes (Addendum)
Received patient in bed to unit.  Alert and oriented.  Informed consent signed and in chart.   TX duration:3.5 hours in chair.  Patient had low BP during dialysis session.  Albumin given to support BP.  Patient tolerated well.  Transported back to the room  Alert, without acute distress.  Hand-off given to patient's nurse.   Access used: Left Femoral HD Cath Access issues: none  Total UF removed: 2.2L Medication(s) given: Benedryl, Tylenol, Vancomycin, Calcitriol, Albumin, 300cc bolus   09/24/23 1710  Vitals  Temp (!) 97.3 F (36.3 C)  Temp Source Oral  BP (!) 114/90  BP Location Right Wrist  BP Method Automatic  Patient Position (if appropriate) Lying  Pulse Rate (!) 114  Pulse Rate Source Monitor  Resp 11  Oxygen Therapy  SpO2 100 %  O2 Device Room Air  Patient Activity (if Appropriate) In chair  Pulse Oximetry Type Continuous  During Treatment Monitoring  Blood Flow Rate (mL/min) 400 mL/min  Arterial Pressure (mmHg) -16.77 mmHg  Venous Pressure (mmHg) 191.91 mmHg  TMP (mmHg) 42.82 mmHg  Ultrafiltration Rate (mL/min) 1942 mL/min  Dialysate Flow Rate (mL/min) 299 ml/min  Duration of HD Treatment -hour(s) 3.49 hour(s)  Cumulative Fluid Removed (mL) per Treatment  2181.7  HD Safety Checks Performed Yes  Intra-Hemodialysis Comments Tx completed  Dialysis Fluid Bolus Normal Saline  Bolus Amount (mL) 300 mL  Post Treatment  Dialyzer Clearance Lightly streaked  Liters Processed 84  Fluid Removed (mL) 2200 mL  Tolerated HD Treatment No (Comment)  Post-Hemodialysis Comments Low BP during session  Hemodialysis Catheter Left Femoral vein Double lumen Permanent (Tunneled)  Placement Date/Time: 07/16/23 1318   Serial / Lot #: [id number]  Expiration Date: 05/24/26  Time Out: Correct patient;Correct site;Correct procedure  Maximum sterile barrier precautions: Hand hygiene;Cap;Mask;Sterile gown;Sterile gloves;Large sterile ...  Site Condition No complications  Blue Lumen  Status Flushed;Heparin locked;Dead end cap in place  Red Lumen Status Flushed;Heparin locked;Dead end cap in place  Purple Lumen Status N/A  Catheter fill solution Heparin 1000 units/ml  Catheter fill volume (Arterial) 2.3 cc  Catheter fill volume (Venous) 2.3  Dressing Type Transparent  Dressing Status Antimicrobial disc in place;Clean, Dry, Intact  Interventions Other (Comment) (deaccessed)  Drainage Description None  Dressing Change Due 09/28/23  Post treatment catheter status Capped and Clamped     Stacie Glaze LPN Kidney Dialysis Unit

## 2023-09-24 NOTE — Progress Notes (Signed)
[name] notified of pt's planned transfer to 5W after HD today. No questions voiced at this time.

## 2023-09-24 NOTE — Progress Notes (Signed)
PROGRESS NOTE  [name] GNF:[id number] DOB: [birth year] DOA: 09/09/2023 PCP: [name], NP  Brief History   This patient has had an extensive 60+ day hospital admission.  Briefly:  [name] is a 42 y.o. 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 He was transferred to [facility] for MV repair on 11/29.  While at [facility] he underwent cardiac cath showing non obstructive disease, craniotomy with abscess drainage on 09/01/23. He had subarachnoid hemorrhage while on IV heparin. Also complicated by right third toe distal necrosis,was seen by podiatry, but family wanted conservative management.  Since his tertiary care needs have been completed, he was transferred back to [facility] for further management.  Therapy eval recommending SNF.  On 09/23/2023 the patient is requesting increase in oxycodone due to pain in his backside. WOC was consulted. The patient's decubitus has advanced to a stage 2. I have asked nursing to assist in offloading his sacrum and buttocks. Increasing oxycodone to allow him to continue to put pressure on this area is not going to improve the situation.  The patient was hypoglycemic this morning. His lantus was discontinued. The patient is refusing oral substances to raise his glucoses and is demanding IV glucose to bring up his glucose. However, the patient only has one IV left, and he is likely to lose that soon. The patient is strongly urged to allow oral supplementation to improve glucoses when his glucoses get low.   The patient has been discussed with Dr. Loreta Ave (IR). He will try to place some kind of access for this  patient. Options for access are very limited.  On the morning of 09/24/2023 the patient's potassium had increased to 7.5 prior to HD. He was given calcium gluconate and and amp of D50 followed by 10 units of IV insulin.   While patient in HD, nurses from [facility] contacted me saying that the patient is better suited to a progressive level of care. Transfer order was placed.  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 - Currently on Seroquel - Status post cognitive eval with SLP on 12/20 - Mental status appears to wax and wane throughout this admission - Consider formal psychiatry evaluation to seek guardianship, at this time it does not appear that patient  has capacity to make decisions on his own - His mother is at bedside and frequently involved in his care   ESRD on hemodialysis MWF - Nephrology consulted - HD per nephro - On daily Lokelma bid due to persistent hyperkalemia. Despte this the patient's potasssium was increased to 7.5 this  morning. This was addressed with calcium gluconate and 1 gm D50 and 10 units of IV insulin. - Patient not compliant with diet   Right third toe necrosis - Was seen by podiatry at [facility], who recommended surgical intervention.  Family has opted for conservative measures with IV antibiotics   Physical deconditioning - Prolonged hospital stay complicated by left AKA - Continue working with therapy as tolerating - Seeking SNF beds   Type 1 diabetes, insulin-dependent - Patient noncompliant with diet - Continue basal/bolus.  Blood glucose is extremely labile.  Avoid aggressive changes in regimen - Continue CBG monitoring --Hypoglycemia this morning.  His lantus was discontinued. The patient is refusing oral substances to raise his glucoses and is demanding IV glucose to bring up his glucose. However, the patient only has one IV left, and he is likely to lose that soon. The patient is strongly urged to allow oral supplementation to improve glucoses when his glucoses get low. I have discussed the patient with Dr. Loreta Ave who will place some kind of appropriate access in this patient will very limited access options. His assistance is greatly appreciated.  Gastroparesis - Stable on current meds   Left internal jugular DVT - Continue Eliquis   Subarachnoid hemorrhage while on heparin - Stable now - Continue to monitor mental status   Prostate fluid collection - Dilated seminal vesicle and no surgical intervention needed per urology.  Will follow-up at discharge   Anemia of chronic disease Hemoglobin baseline appears close to be 8 - Stable for now - Trend CBC - Transfuse if falls below 7   BMI 26 - Outpatient follow up for lifestyle modification and risk factor management     DVT prophylaxis: Eliquis   Code Status: Full Code Family Communication: [name] at bedside and present for discussion. Disposition:  Status is: Inpatient, pending dispo planning    Consultants:     Nephrology Wound Care Neurosurgery  Consulting Physician: Delano Metz, MD   Antimicrobials:   I have seen and examined this patient myself. I have spent 34 minutes in his evaluation and care.  Kimo Bancroft, DO [facility] Direct contact: see [email or web address]  7PM-7AM contact night coverage as above 09/24/2023, 5:23 PM  LOS: 15 days   Procedures  S/P craniotomy, MV and TV endocarditis repair.  Dialysis  Antibiotics   Anti-infectives (From admission, onward)    Start     Dose/Rate Route Frequency Ordered Stop   09/24/23 1200  vancomycin (VANCOREADY) IVPB 500 mg/100 mL        500 mg 100 mL/hr over 60 Minutes Intravenous Every M-W-F (Hemodialysis) 09/21/23 1246 10/29/23 1159   09/20/23 1200  vancomycin (VANCOCIN) 750 mg in sodium chloride 0.9 % 250 mL IVPB  Status:  Discontinued        750 mg 265 mL/hr over 60 Minutes Intravenous Every M-W-F (Hemodialysis) 09/20/23 0446 09/21/23 1246   09/20/23 1045  vancomycin (VANCOCIN) 750 mg in sodium chloride 0.9 % 250 mL IVPB        750 mg 265 mL/hr over 60 Minutes Intravenous  Once 09/20/23 0959 09/20/23 1226   09/20/23 0945  vancomycin (VANCOREADY) IVPB 750 mg/150 mL  Status:  Discontinued  750 mg 150 mL/hr over 60 Minutes Intravenous  Once 09/20/23 0855 09/20/23 0959   09/10/23 1200  vancomycin (VANCOREADY) IVPB 750 mg/150 mL  Status:  Discontinued        750 mg 150 mL/hr over 60 Minutes Intravenous Every M-W-F (Hemodialysis) 09/09/23 2030 09/20/23 0446   09/09/23 2200  linezolid (ZYVOX) tablet 600 mg  Status:  Discontinued        600 mg Oral Every 12 hours 09/09/23 2035 09/10/23 0946        Interval History/Subjective  The patient is resting comfortably. No new complaints.   Objective   Vitals:  Vitals:   09/24/23 1712 09/24/23 1718  BP: (!) 107/48 (!) 107/48  Pulse: (!) 113 (!) 112  Resp: (!) 0 11  Temp:    SpO2: 100% 100%    Exam:  Constitutional:  The patient is awake, alert, and oriented x 3. No  acute distress. Respiratory:  No increased work of breathing. No wheezes, rales, or rhonchi No tactile fremitus Cardiovascular:  Regular rate and rhythm No murmurs, ectopy, or gallups. No lateral PMI. No thrills. Abdomen:  Abdomen is soft, non-tender, non-distended No hernias, masses, or organomegaly Normoactive bowel sounds.  Musculoskeletal:  No cyanosis, clubbing, or edema Skin:  No rashes, lesions, ulcers palpation of skin: no induration or nodules Stage II sacral decubitus ulcer. Neurologic:  CN 2-12 intact Sensation all 4 extremities intact Psychiatric:  Mental status Mood, affect appropriate Orientation to person, place, time  judgment and insight appear intact    I have personally reviewed the following:   Today's Data  Vitals  Lab Data  CBC    Component Value Date/Time   WBC 9.8 09/24/2023 1313   RBC 2.80 (L) 09/24/2023 1313   HGB 7.9 (L) 09/24/2023 1313   HGB 9.7 (L) 08/22/2014 0447   HCT 25.7 (L) 09/24/2023 1313   HCT 30.0 (L) 08/22/2014 0447   PLT 181 09/24/2023 1313   PLT 158 08/22/2014 0447   MCV 91.8 09/24/2023 1313   MCV 97 08/22/2014 0447   MCH 28.2 09/24/2023 1313   MCHC 30.7 09/24/2023 1313   RDW 14.8 09/24/2023 1313   RDW 15.3 (H) 08/22/2014 0447   LYMPHSABS 1.3 09/21/2023 0620   LYMPHSABS 1.7 08/22/2014 0447   MONOABS 0.6 09/21/2023 0620   MONOABS 0.8 08/22/2014 0447   EOSABS 0.3 09/21/2023 0620   EOSABS 0.4 08/22/2014 0447   BASOSABS 0.1 09/21/2023 0620   BASOSABS 0.1 08/22/2014 0447      Latest Ref Rng & Units 09/24/2023    7:36 AM 09/23/2023    6:33 AM 09/22/2023    5:59 AM  BMP  Glucose 70 - 99 mg/dL 324  72  401   BUN 6 - 20 mg/dL 66  50  41   Creatinine 0.61 - 1.24 mg/dL 0.27  2.53  6.64   Sodium 135 - 145 mmol/L 131  136  134   Potassium 3.5 - 5.1 mmol/L >7.5  6.0  5.8   Chloride 98 - 111 mmol/L 94  97  96   CO2 22 - 32 mmol/L 16  26  24    Calcium 8.9 - 10.3 mg/dL 9.2  9.2  9.0      Micro Data   Results for  orders placed or performed during the hospital encounter of 09/09/23  MRSA Next Gen by PCR, Nasal     Status: None   Collection Time: 09/10/23  8:50 AM   Specimen: Nasal Mucosa; Nasal Swab  Result  Value Ref Range Status   MRSA by PCR Next Gen NOT DETECTED NOT DETECTED Final    Comment: (NOTE) The GeneXpert MRSA Assay (FDA approved for NASAL specimens only), is one component of a comprehensive MRSA colonization surveillance program. It is not intended to diagnose MRSA infection nor to guide or monitor treatment for MRSA infections. Test performance is not FDA approved in patients less than 66 years old. Performed at [facility], [address] [place], [name] [address]     Scheduled Meds:  apixaban  5 mg Oral BID   bacitracin   Topical Daily   calcitRIOL  1.5 mcg Oral Q M,W,F-HD   Chlorhexidine Gluconate Cloth  6 each Topical Q0600   darbepoetin (ARANESP) injection - DIALYSIS  100 mcg Subcutaneous Q Sun-1800   gabapentin  100 mg Oral TID   Gerhardt's butt cream   Topical BID   insulin aspart  0-6 Units Subcutaneous TID AC & HS   loratadine  10 mg Oral Daily   metoprolol tartrate  25 mg Oral BID   pantoprazole  40 mg Oral Daily   povidone-Iodine   Topical Q0600   QUEtiapine  12.5 mg Oral q morning   QUEtiapine  25 mg Oral QHS   senna-docusate  2 tablet Oral BID   sevelamer carbonate  1,600 mg Oral TID WC   sodium zirconium cyclosilicate  10 g Oral BID   Continuous Infusions:  albumin human 25 g (09/24/23 1456)   anticoagulant sodium citrate     heparin sodium (porcine)     vancomycin 500 mg (09/24/23 1555)    Principal Problem:   Endocarditis Active Problems:   MRSA bacteremia   Endocarditis of tricuspid valve   ESRD on dialysis ([name])   Cognitive impairment   Insulin dependent type 1 diabetes mellitus ([name])   Diabetic gastroparesis ([name])   Diabetic foot infection ([name])   Endocarditis of mitral valve   Cerebrovascular accident (CVA) due to embolism of  cerebral artery ([name])   LOS: 15 days

## 2023-09-25 DIAGNOSIS — D631 Anemia in chronic kidney disease: Secondary | ICD-10-CM | POA: Diagnosis not present

## 2023-09-25 DIAGNOSIS — I330 Acute and subacute infective endocarditis: Secondary | ICD-10-CM | POA: Diagnosis not present

## 2023-09-25 DIAGNOSIS — Z992 Dependence on renal dialysis: Secondary | ICD-10-CM | POA: Diagnosis not present

## 2023-09-25 DIAGNOSIS — N250 Renal osteodystrophy: Secondary | ICD-10-CM | POA: Diagnosis not present

## 2023-09-25 DIAGNOSIS — I38 Endocarditis, valve unspecified: Secondary | ICD-10-CM | POA: Diagnosis not present

## 2023-09-25 DIAGNOSIS — B9562 Methicillin resistant Staphylococcus aureus infection as the cause of diseases classified elsewhere: Secondary | ICD-10-CM | POA: Diagnosis not present

## 2023-09-25 DIAGNOSIS — N186 End stage renal disease: Secondary | ICD-10-CM | POA: Diagnosis not present

## 2023-09-25 DIAGNOSIS — R7881 Bacteremia: Secondary | ICD-10-CM | POA: Diagnosis not present

## 2023-09-25 DIAGNOSIS — I120 Hypertensive chronic kidney disease with stage 5 chronic kidney disease or end stage renal disease: Secondary | ICD-10-CM | POA: Diagnosis not present

## 2023-09-25 LAB — GLUCOSE, CAPILLARY
Glucose-Capillary: 208 mg/dL — ABNORMAL HIGH (ref 70–99)
Glucose-Capillary: 248 mg/dL — ABNORMAL HIGH (ref 70–99)
Glucose-Capillary: 169 mg/dL — ABNORMAL HIGH (ref 70–99)
Glucose-Capillary: 212 mg/dL — ABNORMAL HIGH (ref 70–99)
Glucose-Capillary: 257 mg/dL — ABNORMAL HIGH (ref 70–99)
Glucose-Capillary: 259 mg/dL — ABNORMAL HIGH (ref 70–99)

## 2023-09-25 LAB — CBC WITH DIFFERENTIAL/PLATELET
Abs Immature Granulocytes: 0.03 10*3/uL (ref 0.00–0.07)
Basophils Absolute: 0 10*3/uL (ref 0.0–0.1)
Basophils Relative: 1 %
Eosinophils Absolute: 0.2 10*3/uL (ref 0.0–0.5)
Eosinophils Relative: 4 %
HCT: 25 % — ABNORMAL LOW (ref 39.0–52.0)
Hemoglobin: 7.7 g/dL — ABNORMAL LOW (ref 13.0–17.0)
Immature Granulocytes: 1 %
Lymphocytes Relative: 12 %
Lymphs Abs: 0.8 10*3/uL (ref 0.7–4.0)
MCH: 28.5 pg (ref 26.0–34.0)
MCHC: 30.8 g/dL (ref 30.0–36.0)
MCV: 92.6 fL (ref 80.0–100.0)
Monocytes Absolute: 0.4 10*3/uL (ref 0.1–1.0)
Monocytes Relative: 6 %
Neutro Abs: 5 10*3/uL (ref 1.7–7.7)
Neutrophils Relative %: 76 %
Platelets: 154 10*3/uL (ref 150–400)
RBC: 2.7 MIL/uL — ABNORMAL LOW (ref 4.22–5.81)
RDW: 14.7 % (ref 11.5–15.5)
WBC: 6.4 10*3/uL (ref 4.0–10.5)
nRBC: 0 % (ref 0.0–0.2)

## 2023-09-25 LAB — RENAL FUNCTION PANEL
Albumin: 2.8 g/dL — ABNORMAL LOW (ref 3.5–5.0)
Anion gap: 17 — ABNORMAL HIGH (ref 5–15)
BUN: 35 mg/dL — ABNORMAL HIGH (ref 6–20)
CO2: 22 mmol/L (ref 22–32)
Calcium: 9.3 mg/dL (ref 8.9–10.3)
Chloride: 96 mmol/L — ABNORMAL LOW (ref 98–111)
Creatinine, Ser: 5.14 mg/dL — ABNORMAL HIGH (ref 0.61–1.24)
GFR, Estimated: 13 mL/min — ABNORMAL LOW (ref >60)
Glucose, Bld: 235 mg/dL — ABNORMAL HIGH (ref 70–99)
Phosphorus: 6.6 mg/dL — ABNORMAL HIGH (ref 2.5–4.6)
Potassium: 5.2 mmol/L — ABNORMAL HIGH (ref 3.5–5.1)
Sodium: 135 mmol/L (ref 135–145)

## 2023-09-25 LAB — MAGNESIUM: Magnesium: 1.8 mg/dL (ref 1.7–2.4)

## 2023-09-25 MED ORDER — SODIUM ZIRCONIUM CYCLOSILICATE 10 G PO PACK
10.0000 g | PACK | Freq: Three times a day (TID) | ORAL | Status: DC
  Administered 2023-09-25 – 2023-11-23 (×119): 10 g via ORAL
  Filled 2023-09-25 (×160): qty 1

## 2023-09-25 MED ORDER — METOPROLOL TARTRATE 5 MG/5ML IV SOLN
5.0000 mg | INTRAVENOUS | Status: DC | PRN
  Administered 2023-09-25: 5 mg via INTRAVENOUS
  Filled 2023-09-25: qty 5

## 2023-09-25 MED ORDER — METOPROLOL TARTRATE 25 MG PO TABS
25.0000 mg | ORAL_TABLET | Freq: Two times a day (BID) | ORAL | Status: DC
  Administered 2023-09-25 – 2023-10-10 (×29): 25 mg via ORAL
  Filled 2023-09-25 (×31): qty 1

## 2023-09-25 NOTE — Progress Notes (Signed)
Consult for upper extremity HD access is pending. CT chest ordered for further evaluation per Dr. Loreta Ave.  Alwyn Ren, AGACNP-BC 09/25/2023, 10:43 AM

## 2023-09-25 NOTE — Progress Notes (Signed)
   09/25/23 1600  Assess: MEWS Score  BP (!) 82/60  Pulse Rate (!) 108  ECG Heart Rate (!) 107  Assess: MEWS Score  MEWS Temp 0  MEWS Systolic 1  MEWS Pulse 1  MEWS RR 0  MEWS LOC 0  MEWS Score 2  MEWS Score Color Yellow  Assess: if the MEWS score is Yellow or Red  Were vital signs accurate and taken at a resting state? Yes  Does the patient meet 2 or more of the SIRS criteria? No  Does the patient have a confirmed or suspected source of infection? No  MEWS guidelines implemented  No, previously yellow, continue vital signs every 4 hours  Assess: SIRS CRITERIA  SIRS Temperature  0  SIRS Respirations  0  SIRS Pulse 1  SIRS WBC 0  SIRS Score Sum  1

## 2023-09-25 NOTE — Progress Notes (Signed)
   09/25/23 1200  Assess: MEWS Score  BP (!) 94/40  ECG Heart Rate (!) 107  Resp 16  Assess: MEWS Score  MEWS Temp 0  MEWS Systolic 1  MEWS Pulse 1  MEWS RR 0  MEWS LOC 0  MEWS Score 2  MEWS Score Color Yellow  Assess: if the MEWS score is Yellow or Red  Were vital signs accurate and taken at a resting state? Yes  Does the patient meet 2 or more of the SIRS criteria? No  Does the patient have a confirmed or suspected source of infection? No  MEWS guidelines implemented   (pt's HR has been ST in 100-107 since admission)  Assess: SIRS CRITERIA  SIRS Temperature  0  SIRS Respirations  0  SIRS Pulse 1  SIRS WBC 0  SIRS Score Sum  1

## 2023-09-25 NOTE — Progress Notes (Signed)
PROGRESS NOTE  [name] AOZ:[id number] DOB: [birth year] DOA: 09/09/2023 PCP: [name], NP  Brief History   This patient has had an extensive 60+ day hospital admission.  Briefly:  [name] is a 42 y.o. 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 He was transferred to [facility] for MV repair on 11/29.  While at [facility] he underwent cardiac cath showing non obstructive disease, craniotomy with abscess drainage on 09/01/23. He had subarachnoid hemorrhage while on IV heparin. Also complicated by right third toe distal necrosis,was seen by podiatry, but family wanted conservative management.  Since his tertiary care needs have been completed, he was transferred back to [facility] for further management.  Therapy eval recommending SNF.  On 09/23/2023 the patient is requesting increase in oxycodone due to pain in his backside. WOC was consulted. The patient's decubitus has advanced to a stage 2. I have asked nursing to assist in offloading his sacrum and buttocks. Increasing oxycodone to allow him to continue to put pressure on this area is not going to improve the situation.  The patient was hypoglycemic this morning. His lantus was discontinued. The patient is refusing oral substances to raise his glucoses and is demanding IV glucose to bring up his glucose. However, the patient only has one IV left, and he is likely to lose that soon. The patient is strongly urged to allow oral supplementation to improve glucoses when his glucoses get low.   The patient has been discussed with Dr. Loreta Ave (IR). He will try to place some kind of access for this  patient. Options for access are very limited.  On the morning of 09/24/2023 the patient's potassium had increased to 7.5 prior to HD. He was given calcium gluconate and and amp of D50 followed by 10 units of IV insulin.   While patient in HD, nurses from [facility] contacted me saying that the patient is better suited to a progressive level of care. The patient was transferred to [address].  On the morning of 12.28.2024 the patient was irate that the dietary has gotten his meal orders wrong 5 times. This is the focus of his anger during my visit this day.  A & P  Principal Problem:   Endocarditis Active Problems:   MRSA bacteremia   Endocarditis of tricuspid valve   ESRD on dialysis [facility])   Cognitive impairment   Insulin dependent type 1 diabetes mellitus ([name])   Diabetic gastroparesis ([name])   Diabetic foot infection ([name])   Endocarditis of mitral valve   Cerebrovascular accident (CVA) due to embolism of cerebral artery ([name])       MRSA mitral valve and tricuspid valve endocarditis Brain abscess status postcraniotomy and abscess drainage on 12/4 at [facility] - Complete 8-week treatment with IV vancomycin, currently receiving with hemodialysis - ID initially consulted, has since signed off - EOT 1/29 - Multiple extensive discussions with the patient and his family regarding the importance of dialysis at discharge and adherence to antibiotic regimen - Continue daily dressing changes until scalp wound has healed   Cognitive impairment - Suspect this is multifactorial given patient's prior MCA CVA and brain abscess status post craniotomy. - Currently on Seroquel -  Status post cognitive eval with SLP on 12/20 - Mental status appears to wax and wane throughout this admission - Consider formal psychiatry evaluation to seek guardianship, at this time it does not appear that patient has capacity to make decisions on his own - His mother is at bedside and frequently involved in his care   ESRD on  hemodialysis MWF - Nephrology consulted - HD per nephro - On daily Lokelma bid due to persistent hyperkalemia. Despte this the patient's potasssium was increased to 7.5 this morning. This was addressed with calcium gluconate and 1 gm D50 and 10 units of IV insulin. --The patient underwent HD on 09/24/2023 with UF of 2.2L. - Patient not compliant with diet   Right third toe necrosis - Was seen by podiatry at [facility], who recommended surgical intervention.  Family has opted for conservative measures with IV antibiotics   Physical deconditioning - Prolonged hospital stay complicated by left AKA - Continue working with therapy as tolerating - Seeking SNF beds   Type 1 diabetes, insulin-dependent - Patient noncompliant with diet - Continue basal/bolus.  Blood glucose is extremely labile.  Avoid aggressive changes in regimen - Continue CBG monitoring --Hypoglycemia this morning.  His lantus was discontinued. The patient is refusing oral substances to raise his glucoses and is demanding IV glucose to bring up his glucose. However, the patient only has one IV left, and he is likely to lose that soon. The patient is strongly urged to allow oral supplementation to improve glucoses when his glucoses get low. I have discussed the patient with Dr. Loreta Ave who will place some kind of appropriate access in this patient will very limited access options. His assistance is greatly appreciated.  Gastroparesis - Stable on current meds   Left internal jugular DVT - Continue Eliquis   Subarachnoid hemorrhage while on heparin - Stable now - Continue to monitor mental status   Prostate fluid collection - Dilated seminal vesicle and no surgical intervention needed per urology.  Will follow-up at discharge   Anemia of chronic disease Hemoglobin baseline appears close to be 8 - Stable for now - Trend CBC - Transfuse if falls below 7   BMI 26 - Outpatient follow up for lifestyle modification and risk  factor management     DVT prophylaxis: Eliquis   Code Status: Full Code Family Communication: [name] at bedside and present for discussion. Disposition:  Status is: Inpatient, pending dispo planning    Consultants:    Nephrology Wound Care Neurosurgery  Consulting Physician: Delano Metz, MD   Antimicrobials:   I have seen and examined this patient myself. I have spent 34 minutes in his evaluation and care.  Caitlain Tweed, DO [facility] Direct contact: see [email or web address]  7PM-7AM contact night coverage as above 09/25/2023, 3:24 PM  LOS: 16 days   Procedures  S/P craniotomy, MV and TV endocarditis repair.  Dialysis  Antibiotics   Anti-infectives (From admission, onward)    Start     Dose/Rate Route Frequency Ordered Stop   09/24/23 1200  vancomycin (VANCOREADY) IVPB 500 mg/100 mL        500 mg 100 mL/hr over 60 Minutes Intravenous Every M-W-F (Hemodialysis) 09/21/23 1246 10/29/23 1159   09/20/23 1200  vancomycin (VANCOCIN) 750 mg in sodium chloride 0.9 % 250 mL IVPB  Status:  Discontinued        750 mg 265 mL/hr over 60 Minutes Intravenous Every M-W-F (Hemodialysis) 09/20/23 0446 09/21/23 1246   09/20/23 1045  vancomycin (VANCOCIN) 750  mg in sodium chloride 0.9 % 250 mL IVPB        750 mg 265 mL/hr over 60 Minutes Intravenous  Once 09/20/23 0959 09/20/23 1226   09/20/23 0945  vancomycin (VANCOREADY) IVPB 750 mg/150 mL  Status:  Discontinued        750 mg 150 mL/hr over 60 Minutes Intravenous  Once 09/20/23 0855 09/20/23 0959   09/10/23 1200  vancomycin (VANCOREADY) IVPB 750 mg/150 mL  Status:  Discontinued        750 mg 150 mL/hr over 60 Minutes Intravenous Every M-W-F (Hemodialysis) 09/09/23 2030 09/20/23 0446   09/09/23 2200  linezolid (ZYVOX) tablet 600 mg  Status:  Discontinued        600 mg Oral Every 12 hours 09/09/23 2035 09/10/23 0946        Interval History/Subjective  The patient is resting comfortably. No new complaints.   Objective    Vitals:  Vitals:   09/25/23 1058 09/25/23 1200  BP:  (!) 94/40  Pulse: 100   Resp: 17 16  Temp: 98.1 F (36.7 C)   SpO2: 95%     Exam:  Constitutional:  The patient is awake, alert, and oriented x 3. No acute distress. Respiratory:  No increased work of breathing. No wheezes, rales, or rhonchi No tactile fremitus Cardiovascular:  Regular rate and rhythm No murmurs, ectopy, or gallups. No lateral PMI. No thrills. Abdomen:  Abdomen is soft, non-tender, non-distended No hernias, masses, or organomegaly Normoactive bowel sounds.  Musculoskeletal:  No cyanosis, clubbing, or edema Skin:  No rashes, lesions, ulcers palpation of skin: no induration or nodules Stage II sacral decubitus ulcer. Neurologic:  CN 2-12 intact Sensation all 4 extremities intact He is moving all extremities. Psychiatric:  The patient is agitated and irate.  I have personally reviewed the following:   Today's Data  Vitals  Lab Data  CBC    Component Value Date/Time   WBC 6.4 09/25/2023 0301   RBC 2.70 (L) 09/25/2023 0301   HGB 7.7 (L) 09/25/2023 0301   HGB 9.7 (L) 08/22/2014 0447   HCT 25.0 (L) 09/25/2023 0301   HCT 30.0 (L) 08/22/2014 0447   PLT 154 09/25/2023 0301   PLT 158 08/22/2014 0447   MCV 92.6 09/25/2023 0301   MCV 97 08/22/2014 0447   MCH 28.5 09/25/2023 0301   MCHC 30.8 09/25/2023 0301   RDW 14.7 09/25/2023 0301   RDW 15.3 (H) 08/22/2014 0447   LYMPHSABS 0.8 09/25/2023 0301   LYMPHSABS 1.7 08/22/2014 0447   MONOABS 0.4 09/25/2023 0301   MONOABS 0.8 08/22/2014 0447   EOSABS 0.2 09/25/2023 0301   EOSABS 0.4 08/22/2014 0447   BASOSABS 0.0 09/25/2023 0301   BASOSABS 0.1 08/22/2014 0447      Latest Ref Rng & Units 09/25/2023    3:02 AM 09/24/2023    7:36 AM 09/23/2023    6:33 AM  BMP  Glucose 70 - 99 mg/dL 956  387  72   BUN 6 - 20 mg/dL 35  66  50   Creatinine 0.61 - 1.24 mg/dL 5.64  3.32  9.51   Sodium 135 - 145 mmol/L 135  131  136   Potassium 3.5 - 5.1  mmol/L 5.2  >7.5  6.0   Chloride 98 - 111 mmol/L 96  94  97   CO2 22 - 32 mmol/L 22  16  26    Calcium 8.9 - 10.3 mg/dL 9.3  9.2  9.2      Micro Data  Results for orders placed or performed during the hospital encounter of 09/09/23  MRSA Next Gen by PCR, Nasal     Status: None   Collection Time: 09/10/23  8:50 AM   Specimen: Nasal Mucosa; Nasal Swab  Result Value Ref Range Status   MRSA by PCR Next Gen NOT DETECTED NOT DETECTED Final    Comment: (NOTE) The GeneXpert MRSA Assay (FDA approved for NASAL specimens only), is one component of a comprehensive MRSA colonization surveillance program. It is not intended to diagnose MRSA infection nor to guide or monitor treatment for MRSA infections. Test performance is not FDA approved in patients less than 71 years old. Performed at [facility], [address], [place], [name] [address]     Scheduled Meds:  apixaban  5 mg Oral BID   bacitracin   Topical Daily   calcitRIOL  1.5 mcg Oral Q M,W,F-HD   Chlorhexidine Gluconate Cloth  6 each Topical Q0600   darbepoetin (ARANESP) injection - DIALYSIS  100 mcg Subcutaneous Q Sun-1800   gabapentin  100 mg Oral TID   Gerhardt's butt cream   Topical BID   influenza vac split trivalent PF  0.5 mL Intramuscular Tomorrow-1000   insulin aspart  0-6 Units Subcutaneous TID AC & HS   loratadine  10 mg Oral Daily   metoprolol tartrate  25 mg Oral BID   pantoprazole  40 mg Oral Daily   povidone-Iodine   Topical Q0600   QUEtiapine  12.5 mg Oral q morning   QUEtiapine  25 mg Oral QHS   senna-docusate  2 tablet Oral BID   sevelamer carbonate  1,600 mg Oral TID WC   sodium zirconium cyclosilicate  10 g Oral TID   Continuous Infusions:  albumin human 25 g (09/24/23 1456)   heparin sodium (porcine)     vancomycin 500 mg (09/24/23 1555)    Principal Problem:   Endocarditis Active Problems:   MRSA bacteremia   Endocarditis of tricuspid valve   ESRD on dialysis ([name])   Cognitive  impairment   Insulin dependent type 1 diabetes mellitus ([name])   Diabetic gastroparesis ([name])   Diabetic foot infection ([name])   Endocarditis of mitral valve   Cerebrovascular accident (CVA) due to embolism of cerebral artery ([name])   LOS: 16 days

## 2023-09-25 NOTE — Progress Notes (Signed)
   09/25/23 1800  Assess: MEWS Score  BP (!) 96/52  Pulse Rate (!) 111  ECG Heart Rate (!) 112  Assess: MEWS Score  MEWS Temp 0  MEWS Systolic 1  MEWS Pulse 2  MEWS RR 0  MEWS LOC 0  MEWS Score 3  MEWS Score Color Yellow  Assess: if the MEWS score is Yellow or Red  Were vital signs accurate and taken at a resting state? Yes  Does the patient meet 2 or more of the SIRS criteria? No  Does the patient have a confirmed or suspected source of infection? No  MEWS guidelines implemented  No, previously yellow, continue vital signs every 4 hours (ST, this has been pt's normal)  Assess: SIRS CRITERIA  SIRS Temperature  0  SIRS Respirations  0  SIRS Pulse 1  SIRS WBC 0  SIRS Score Sum  1

## 2023-09-25 NOTE — Plan of Care (Signed)
  Problem: Nutritional: Goal: Maintenance of adequate nutrition will improve Outcome: Completed/Met

## 2023-09-25 NOTE — Progress Notes (Signed)
[facility] Progress Note   OP HD: [facility] MWF  4h   400/1.5  77kg  L fem TDC 2/3 bath  Heparin none   Assessment/ Plan: 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 Not a candidate for surgery here. SP line holiday w/ new TDC placed 10/18. Continues on IV vanc upon return from [place] . Adjusted plan per ID pharm is 750mg  MWF through 10/27/23.  Hyperkalemia - Pt not adhering to renal diet though sign on door with specific instructions for dietary.  Lokelma has been increased to BID, remains high increase to TID.  Follow daily labs.  SP I&D brain abscess: drained 12/04 at [facility]. Mentation improved. 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 Next HD Mon  Volume: on RA, L arm + edema, no other edema. UF as tolerated with HD Anemia: Hb 8-9s, on ESA. Hold iron infusion while on IV ABX. 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 Cont binders w/ meals.  Hypertension: BP on low side recently, on low dose BB.   HD access:  L  femoral TDC placed on 10/18. 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 Type 1 DM: per primary Dispo - tolerated full HD 12/27 in chair. Cont efforts to dialyze in chair.  Appears SNF being pursued  Subjective: UF 2.2L with HD yesterday.  This AM feeling ok.    Objective Vitals:   09/24/23 2236 09/25/23 0310 09/25/23 0342 09/25/23 0719  BP: (!) 86/54 (!) 136/99 (!) 136/99   Pulse:   (!) 129 (!) 103  Resp:    16  Temp:  98.3 F (36.8 C)  99.5 F (37.5 C)  TempSrc:  Oral  Oral  SpO2:    95%  Weight:      Height:       Physical Exam General: Chronically ill appearing male in NAD, calm and cooperative Heart:S1,S2 RRR No M/R/G Lungs: CTAB no WOB on RA Abdomen: NABS, NT Extremities: L AKA No RLE edema. Edema LUE Dialysis Access: L femoral TDC drsg intact  Additional Objective Labs: Basic Metabolic Panel: Recent Labs  Lab 09/23/23 0633 09/24/23 0736  09/25/23 0302  NA 136 131* 135  K 6.0* >7.5* 5.2*  CL 97* 94* 96*  CO2 26 16* 22  GLUCOSE 72 285* 235*  BUN 50* 66* 35*  CREATININE 6.45* 7.71* 5.14*  CALCIUM 9.2 9.2 9.3  PHOS 8.9* 11.3* 6.6*   Liver Function Tests: Recent Labs  Lab 09/23/23 0633 09/24/23 0736 09/25/23 0302  ALBUMIN 2.6* 2.6* 2.8*   No results for input(s): "LIPASE", "AMYLASE" in the last 168 hours. CBC: Recent Labs  Lab 09/19/23 2030 09/20/23 1258 09/21/23 0620 09/24/23 1313 09/25/23 0301  WBC 6.3 7.3 6.4 9.8 6.4  NEUTROABS 4.6 5.1 4.2  --  5.0  HGB 8.5* 8.3* 9.1* 7.9* 7.7*  HCT 27.7* 27.1* 29.3* 25.7* 25.0*  MCV 94.2 94.1 92.7 91.8 92.6  PLT 187 203 189 181 154   Blood Culture    Component Value Date/Time   SDES BLOOD SITE NOT SPECIFIED 08/25/2023 1706   SPECREQUEST  08/25/2023 1706    BOTTLES DRAWN AEROBIC ONLY Blood Culture results may not be optimal due to an inadequate volume of blood received in culture bottles   CULT  08/25/2023 1706    NO GROWTH 5 DAYS Performed at [facility], [address], [place], [name] [address]    REPTSTATUS 08/30/2023 FINAL  08/25/2023 1706    Cardiac Enzymes: No results for input(s): "CKTOTAL", "CKMB", "CKMBINDEX", "TROPONINI" in the last 168 hours. CBG: Recent Labs  Lab 09/24/23 1118 09/24/23 1257 09/24/23 2158 09/25/23 0312 09/25/23 0620  GLUCAP 320* 159* 180* 208* 248*   Iron Studies: No results for input(s): "IRON", "TIBC", "TRANSFERRIN", "FERRITIN" in the last 72 hours. @lablastinr3 @ Studies/Results: IR RADIOLOGY PERIPHERAL GUIDED IV START Result Date: 09/24/2023 INDICATION: 42 year old male with a history upper extremity deep venous occlusions, current hemodialysis catheter in the left femoral region has been referred for IV access given difficulty of securing IV access. EXAM: ULTRASOUND GUIDED VENIPUNCTURE MEDICATIONS: None ANESTHESIA/SEDATION: None FLUOROSCOPY: Ultrasound COMPLICATIONS: None PROCEDURE: Bedside procedure was performed  for venipuncture. Sterile Barrier Technique was utilized including caps, mask, sterile gloves, sterile drape, hand hygiene and skin antiseptic. A timeout was performed prior to the initiation of the procedure. Ultrasound survey of the upper extremity was performed with images stored and sent to PACs. Initially, ultrasound-guided access was attempted using a Bard 20G angiocath. However, given the patient's hemodialysis history, the vein was too dense for the needle on the Angiocath. Ultimately, a micropuncture needle was required to access the right basilic vein under ultrasound. With venous blood flow returned, an .018 micro wire was passed through the needle into the vein. The needle was removed, and a micropuncture sheath was placed over the wire. The inner dilator and wire were removed, and the catheter was secured in position after attaching to saline flush. Patient tolerated the procedure well and remained hemodynamically stable throughout. No complications were encountered and no significant blood loss. IMPRESSION: Status post ultrasound-guided right basilic vein venipuncture. Signed, Yvone Neu. Miachel Roux, RPVI [facility] Electronically Signed   By: Gilmer Mor D.O.   On: 09/24/2023 17:04   IR US Guide Vasc Access Right Result Date: 09/24/2023 INDICATION: 42 year old male with a history upper extremity deep venous occlusions, current hemodialysis catheter in the left femoral region has been referred for IV access given difficulty of securing IV access. EXAM: ULTRASOUND GUIDED VENIPUNCTURE MEDICATIONS: None ANESTHESIA/SEDATION: None FLUOROSCOPY: Ultrasound COMPLICATIONS: None PROCEDURE: Bedside procedure was performed for venipuncture. Sterile Barrier Technique was utilized including caps, mask, sterile gloves, sterile drape, hand hygiene and skin antiseptic. A timeout was performed prior to the initiation of the procedure. Ultrasound survey  of the upper extremity was performed with images stored and sent to PACs. Initially, ultrasound-guided access was attempted using a Bard 20G angiocath. However, given the patient's hemodialysis history, the vein was too dense for the needle on the Angiocath. Ultimately, a micropuncture needle was required to access the right basilic vein under ultrasound. With venous blood flow returned, an .018 micro wire was passed through the needle into the vein. The needle was removed, and a micropuncture sheath was placed over the wire. The inner dilator and wire were removed, and the catheter was secured in position after attaching to saline flush. Patient tolerated the procedure well and remained hemodynamically stable throughout. No complications were encountered and no significant blood loss. IMPRESSION: Status post ultrasound-guided right basilic vein venipuncture. Signed, Yvone Neu. Miachel Roux, RPVI [facility] Electronically Signed   By: Gilmer Mor D.O.   On: 09/24/2023 17:04   [place] EKG SITE RITE Result Date: 09/24/2023 If Site Rite image not attached, placement could not be confirmed due to current cardiac rhythm.  Medications:  albumin human 25 g (09/24/23 1456)   heparin sodium (porcine)  vancomycin 500 mg (09/24/23 1555)    apixaban  5 mg Oral BID   bacitracin   Topical Daily   calcitRIOL  1.5 mcg Oral Q M,W,F-HD   Chlorhexidine Gluconate Cloth  6 each Topical Q0600   darbepoetin (ARANESP) injection - DIALYSIS  100 mcg Subcutaneous Q Sun-1800   gabapentin  100 mg Oral TID   Gerhardt's butt cream   Topical BID   influenza vac split trivalent PF  0.5 mL Intramuscular Tomorrow-1000   insulin aspart  0-6 Units Subcutaneous TID AC & HS   loratadine  10 mg Oral Daily   metoprolol tartrate  25 mg Oral BID   pantoprazole  40 mg Oral Daily   povidone-Iodine   Topical Q0600   QUEtiapine  12.5 mg Oral q morning   QUEtiapine  25 mg Oral  QHS   senna-docusate  2 tablet Oral BID   sevelamer carbonate  1,600 mg Oral TID WC   sodium zirconium cyclosilicate  10 g Oral TID

## 2023-09-26 DIAGNOSIS — R7881 Bacteremia: Secondary | ICD-10-CM | POA: Diagnosis not present

## 2023-09-26 DIAGNOSIS — B9562 Methicillin resistant Staphylococcus aureus infection as the cause of diseases classified elsewhere: Secondary | ICD-10-CM | POA: Diagnosis not present

## 2023-09-26 DIAGNOSIS — I38 Endocarditis, valve unspecified: Secondary | ICD-10-CM | POA: Diagnosis not present

## 2023-09-26 DIAGNOSIS — I330 Acute and subacute infective endocarditis: Secondary | ICD-10-CM | POA: Diagnosis not present

## 2023-09-26 DIAGNOSIS — Z992 Dependence on renal dialysis: Secondary | ICD-10-CM | POA: Diagnosis not present

## 2023-09-26 DIAGNOSIS — I120 Hypertensive chronic kidney disease with stage 5 chronic kidney disease or end stage renal disease: Secondary | ICD-10-CM | POA: Diagnosis not present

## 2023-09-26 DIAGNOSIS — N186 End stage renal disease: Secondary | ICD-10-CM | POA: Diagnosis not present

## 2023-09-26 DIAGNOSIS — N250 Renal osteodystrophy: Secondary | ICD-10-CM | POA: Diagnosis not present

## 2023-09-26 DIAGNOSIS — D631 Anemia in chronic kidney disease: Secondary | ICD-10-CM | POA: Diagnosis not present

## 2023-09-26 LAB — RENAL FUNCTION PANEL
Albumin: 2.6 g/dL — ABNORMAL LOW (ref 3.5–5.0)
Anion gap: 13 (ref 5–15)
BUN: 42 mg/dL — ABNORMAL HIGH (ref 6–20)
CO2: 25 mmol/L (ref 22–32)
Calcium: 9.2 mg/dL (ref 8.9–10.3)
Chloride: 98 mmol/L (ref 98–111)
Creatinine, Ser: 6.39 mg/dL — ABNORMAL HIGH (ref 0.61–1.24)
GFR, Estimated: 10 mL/min — ABNORMAL LOW (ref >60)
Glucose, Bld: 266 mg/dL — ABNORMAL HIGH (ref 70–99)
Phosphorus: 8.1 mg/dL — ABNORMAL HIGH (ref 2.5–4.6)
Potassium: 4.7 mmol/L (ref 3.5–5.1)
Sodium: 136 mmol/L (ref 135–145)

## 2023-09-26 LAB — CBC WITH DIFFERENTIAL/PLATELET
Abs Immature Granulocytes: 0.01 10*3/uL (ref 0.00–0.07)
Basophils Absolute: 0 10*3/uL (ref 0.0–0.1)
Basophils Relative: 1 %
Eosinophils Absolute: 0.4 10*3/uL (ref 0.0–0.5)
Eosinophils Relative: 6 %
HCT: 23.7 % — ABNORMAL LOW (ref 39.0–52.0)
Hemoglobin: 7.4 g/dL — ABNORMAL LOW (ref 13.0–17.0)
Immature Granulocytes: 0 %
Lymphocytes Relative: 16 %
Lymphs Abs: 1 10*3/uL (ref 0.7–4.0)
MCH: 28.8 pg (ref 26.0–34.0)
MCHC: 31.2 g/dL (ref 30.0–36.0)
MCV: 92.2 fL (ref 80.0–100.0)
Monocytes Absolute: 0.6 10*3/uL (ref 0.1–1.0)
Monocytes Relative: 9 %
Neutro Abs: 4.3 10*3/uL (ref 1.7–7.7)
Neutrophils Relative %: 68 %
Platelets: 168 10*3/uL (ref 150–400)
RBC: 2.57 MIL/uL — ABNORMAL LOW (ref 4.22–5.81)
RDW: 15 % (ref 11.5–15.5)
WBC: 6.4 10*3/uL (ref 4.0–10.5)
nRBC: 0 % (ref 0.0–0.2)

## 2023-09-26 LAB — GLUCOSE, CAPILLARY
Glucose-Capillary: 233 mg/dL — ABNORMAL HIGH (ref 70–99)
Glucose-Capillary: 201 mg/dL — ABNORMAL HIGH (ref 70–99)
Glucose-Capillary: 288 mg/dL — ABNORMAL HIGH (ref 70–99)
Glucose-Capillary: 375 mg/dL — ABNORMAL HIGH (ref 70–99)

## 2023-09-26 LAB — MAGNESIUM: Magnesium: 1.9 mg/dL (ref 1.7–2.4)

## 2023-09-26 MED ORDER — MIDODRINE HCL 5 MG PO TABS
10.0000 mg | ORAL_TABLET | ORAL | Status: DC
  Administered 2023-09-27 – 2023-10-20 (×11): 10 mg via ORAL
  Filled 2023-09-26 (×15): qty 2

## 2023-09-26 NOTE — Progress Notes (Signed)
[facility] Progress Note   OP HD: [facility] MWF  4h   400/1.5  77kg  L fem TDC 2/3 bath  Heparin none   Assessment/ Plan: 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 Not a candidate for surgery here. SP line holiday w/ new TDC placed 10/18. Continues on IV vanc upon return from [facility] . Adjusted plan per ID pharm is 750mg  MWF through 10/27/23.  Hyperkalemia - Pt not adhering to renal diet though sign on door with specific instructions for dietary.  Lokelma has been increased to BID, remains high increase to TID.  Follow daily labs - K mid 4s now, cont TID lokelma.  SP I&D brain abscess: drained 12/04 at [facility]. Mentation improved. 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 Next HD Mon - BP has been on low side, will add pre HD midodrine.  Volume: on RA, L arm + edema, no other edema. UF as tolerated with HD Anemia: Hb 8-9s, on ESA. Hold iron infusion while on IV ABX. 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 Cont binders w/ meals.  Hypertension: BP on low side recently, on low dose BB w h/o atrial arrhythmia.   HD access:  L  femoral TDC placed on 10/18. 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 Type 1 DM: per primary Dispo - tolerated full HD 12/27 in chair. Cont efforts to dialyze in chair.  Appears SNF being pursued  Subjective:This AM feeling ok.  No new issues.   Objective Vitals:   09/25/23 1946 09/26/23 0052 09/26/23 0635 09/26/23 0818  BP:  117/60 114/61 (!) 114/46  Pulse: (!) 113 (!) 107 (!) 109 (!) 112  Resp: 18 20 20  (!) 22  Temp: 99 F (37.2 C) 98.2 F (36.8 C) (!) 97.2 F (36.2 C) (!) 97.5 F (36.4 C)  TempSrc: Axillary Oral Oral Axillary  SpO2: 100% 98% 99% 100%  Weight:      Height:       Physical Exam General: Chronically ill appearing male in NAD, calm and cooperative Heart:S1,S2 RRR No M/R/G Lungs: CTAB no WOB on RA Abdomen: NABS, NT Extremities: L  AKA No RLE edema. Edema LUE Dialysis Access: L femoral TDC drsg intact  Additional Objective Labs: Basic Metabolic Panel: Recent Labs  Lab 09/24/23 0736 09/25/23 0302 09/26/23 0232  NA 131* 135 136  K >7.5* 5.2* 4.7  CL 94* 96* 98  CO2 16* 22 25  GLUCOSE 285* 235* 266*  BUN 66* 35* 42*  CREATININE 7.71* 5.14* 6.39*  CALCIUM 9.2 9.3 9.2  PHOS 11.3* 6.6* 8.1*   Liver Function Tests: Recent Labs  Lab 09/24/23 0736 09/25/23 0302 09/26/23 0232  ALBUMIN 2.6* 2.8* 2.6*   No results for input(s): "LIPASE", "AMYLASE" in the last 168 hours. CBC: Recent Labs  Lab 09/20/23 1258 09/21/23 0620 09/24/23 1313 09/25/23 0301 09/26/23 0231  WBC 7.3 6.4 9.8 6.4 6.4  NEUTROABS 5.1 4.2  --  5.0 4.3  HGB 8.3* 9.1* 7.9* 7.7* 7.4*  HCT 27.1* 29.3* 25.7* 25.0* 23.7*  MCV 94.1 92.7 91.8 92.6 92.2  PLT 203 189 181 154 168   Blood Culture    Component Value Date/Time   SDES BLOOD SITE NOT SPECIFIED 08/25/2023 1706   SPECREQUEST  08/25/2023 1706    BOTTLES DRAWN AEROBIC ONLY Blood Culture results may not be optimal due to an inadequate volume of blood received in culture bottles   CULT  08/25/2023 1706    NO GROWTH 5 DAYS Performed at [facility], [address], [place], [name] [address]    REPTSTATUS 08/30/2023 FINAL 08/25/2023 1706    Cardiac Enzymes: No results for input(s): "CKTOTAL", "CKMB", "CKMBINDEX", "TROPONINI" in the last 168 hours. CBG: Recent Labs  Lab 09/25/23 1050 09/25/23 1528 09/25/23 1954 09/25/23 2328 09/26/23 0627  GLUCAP 169* 212* 257* 259* 233*   Iron Studies: No results for input(s): "IRON", "TIBC", "TRANSFERRIN", "FERRITIN" in the last 72 hours. @lablastinr3 @ Studies/Results: IR RADIOLOGY PERIPHERAL GUIDED IV START Result Date: 09/24/2023 INDICATION: 42 year old male with a history upper extremity deep venous occlusions, current hemodialysis catheter in the left femoral region has been referred for IV access given difficulty of securing IV  access. EXAM: ULTRASOUND GUIDED VENIPUNCTURE MEDICATIONS: None ANESTHESIA/SEDATION: None FLUOROSCOPY: Ultrasound COMPLICATIONS: None PROCEDURE: Bedside procedure was performed for venipuncture. Sterile Barrier Technique was utilized including caps, mask, sterile gloves, sterile drape, hand hygiene and skin antiseptic. A timeout was performed prior to the initiation of the procedure. Ultrasound survey of the upper extremity was performed with images stored and sent to PACs. Initially, ultrasound-guided access was attempted using a Bard 20G angiocath. However, given the patient's hemodialysis history, the vein was too dense for the needle on the Angiocath. Ultimately, a micropuncture needle was required to access the right basilic vein under ultrasound. With venous blood flow returned, an .018 micro wire was passed through the needle into the vein. The needle was removed, and a micropuncture sheath was placed over the wire. The inner dilator and wire were removed, and the catheter was secured in position after attaching to saline flush. Patient tolerated the procedure well and remained hemodynamically stable throughout. No complications were encountered and no significant blood loss. IMPRESSION: Status post ultrasound-guided right basilic vein venipuncture. Signed, Yvone Neu. Miachel Roux, RPVI [facility] Radiology Electronically Signed   By: Gilmer Mor D.O.   On: 09/24/2023 17:04   IR US Guide Vasc Access Right Result Date: 09/24/2023 INDICATION: 42 year old male with a history upper extremity deep venous occlusions, current hemodialysis catheter in the left femoral region has been referred for IV access given difficulty of securing IV access. EXAM: ULTRASOUND GUIDED VENIPUNCTURE MEDICATIONS: None ANESTHESIA/SEDATION: None FLUOROSCOPY: Ultrasound COMPLICATIONS: None PROCEDURE: Bedside procedure was performed for venipuncture. Sterile Barrier Technique was  utilized including caps, mask, sterile gloves, sterile drape, hand hygiene and skin antiseptic. A timeout was performed prior to the initiation of the procedure. Ultrasound survey of the upper extremity was performed with images stored and sent to PACs. Initially, ultrasound-guided access was attempted using a Bard 20G angiocath. However, given the patient's hemodialysis history, the vein was too dense for the needle on the Angiocath. Ultimately, a micropuncture needle was required to access the right basilic vein under ultrasound. With venous blood flow returned, an .018 micro wire was passed through the needle into the vein. The needle was removed, and a micropuncture sheath was placed over the wire. The inner dilator and wire were removed, and the catheter was secured in position after attaching to saline flush. Patient tolerated the procedure well and remained hemodynamically stable throughout. No complications were encountered and no significant blood loss. IMPRESSION: Status post ultrasound-guided right basilic vein venipuncture. Signed, Yvone Neu. Miachel Roux, RPVI [facility] Radiology Electronically Signed   By: Gilmer Mor D.O.   On: 09/24/2023 17:04   Medications:  albumin human 25 g (09/24/23 1456)   heparin  sodium (porcine)     vancomycin 500 mg (09/24/23 1555)    apixaban  5 mg Oral BID   bacitracin   Topical Daily   calcitRIOL  1.5 mcg Oral Q M,W,F-HD   Chlorhexidine Gluconate Cloth  6 each Topical Q0600   darbepoetin (ARANESP) injection - DIALYSIS  100 mcg Subcutaneous Q Sun-1800   gabapentin  100 mg Oral TID   Gerhardt's butt cream   Topical BID   influenza vac split trivalent PF  0.5 mL Intramuscular Tomorrow-1000   insulin aspart  0-6 Units Subcutaneous TID AC & HS   loratadine  10 mg Oral Daily   metoprolol tartrate  25 mg Oral BID   pantoprazole  40 mg Oral Daily   povidone-Iodine   Topical Q0600   QUEtiapine  12.5 mg Oral  q morning   QUEtiapine  25 mg Oral QHS   senna-docusate  2 tablet Oral BID   sevelamer carbonate  1,600 mg Oral TID WC   sodium zirconium cyclosilicate  10 g Oral TID

## 2023-09-26 NOTE — Progress Notes (Signed)
PROGRESS NOTE  [name] ZOX:[id number] DOB: 1980-11-26 DOA: 09/09/2023 PCP: [name], NP  Brief History   This patient has had an extensive 60+ day hospital admission.  Briefly:  [name] is a 41 y.o. male with medical history significant of ESRD on hemodialysis (not fully compliant), insulin-dependent type 1 diabetes, gastroparesis, Left AKA, admitted to [facility] on 07/09/23 for DKA, Sepsis and hospital course significant for MV AND TV MRSA endocarditis, followed by acute MCA stroke, left occipital and bilateral cerebellar strokes consistent with septic emboli,  Brain abscess from MRSA, left IJ DVT. He was transferred to [facility] for MV repair on 11/29.  While at [facility] he underwent cardiac cath showing non obstructive disease, craniotomy with abscess drainage on 09/01/23. He had subarachnoid hemorrhage while on IV heparin. Also complicated by right third toe distal necrosis,was seen by podiatry, but family wanted conservative management.  Since his tertiary care needs have been completed, he was transferred back to [facility] for further management.  Therapy eval recommending SNF.  On 09/23/2023 the patient is requesting increase in oxycodone due to pain in his backside. WOC was consulted. The patient's decubitus has advanced to a stage 2. I have asked nursing to assist in offloading his sacrum and buttocks. Increasing oxycodone to allow him to continue to put pressure on this area is not going to improve the situation.  The patient was hypoglycemic this morning. His lantus was discontinued. The patient is refusing oral substances to raise his glucoses and is demanding IV glucose to bring up his glucose. However, the patient only has one IV left, and he is likely to lose that soon. The patient is strongly urged to allow oral supplementation to improve glucoses when his glucoses get low.   The patient has been discussed with Dr. Loreta Ave (IR). He will try to place some kind of access for this  patient. Options for access are very limited.  On the morning of 09/24/2023 the patient's potassium had increased to 7.5 prior to HD. He was given calcium gluconate and and amp of D50 followed by 10 units of IV insulin.   While patient in HD, nurses from [facility] contacted me saying that the patient is better suited to a progressive level of care. The patient was transferred to [facility].  On the morning of 12.28.2024 the patient was irate that the dietary has gotten his meal orders wrong 5 times. This is the focus of his anger during my visit this day.  A & P  Principal Problem:   Endocarditis Active Problems:   MRSA bacteremia   Endocarditis of tricuspid valve   ESRD on dialysis [facility])   Cognitive impairment   Insulin dependent type 1 diabetes mellitus ([name])   Diabetic gastroparesis ([name])   Diabetic foot infection ([name])   Endocarditis of mitral valve   Cerebrovascular accident (CVA) due to embolism of cerebral artery ([name])       MRSA mitral valve and tricuspid valve endocarditis Brain abscess status postcraniotomy and abscess drainage on 12/4 at [facility] - Complete 8-week treatment with IV vancomycin, currently receiving with hemodialysis - ID initially consulted, has since signed off - EOT 1/29 - Multiple extensive discussions with the patient and his family regarding the importance of dialysis at discharge and adherence to antibiotic regimen - Continue daily dressing changes until scalp wound has healed __ continue vancomycin IV 750 mg q MWF with dialysis. Last dose 10/27/2023.   Cognitive impairment - Suspect this is multifactorial given patient's prior  MCA CVA and brain abscess status post craniotomy. - Currently on Seroquel - Status post cognitive eval with SLP on 12/20 - Mental status appears to wax and wane throughout this admission - Consider formal psychiatry evaluation to seek guardianship, at this time it does not appear that patient has capacity to make decisions on his own -  His mother is at bedside and frequently involved in his care   ESRD on hemodialysis MWF - Nephrology consulted - HD per nephro - On daily Lokelma bid due to persistent hyperkalemia. --The patient underwent HD on 09/24/2023 with UF of 2.2L. K of 4.7 today. - Patient not compliant with diet --Anemia of chronic kidney disease. Hemoglobin is 7.4. Transfuse for hemoglobin less than 7.0.   Right third toe necrosis - Was seen by podiatry at [facility], who recommended surgical intervention.  Family has opted for conservative measures with IV antibiotics   Physical deconditioning - Prolonged hospital stay complicated by left AKA - Continue working with therapy as tolerating - Seeking SNF beds   Type 1 diabetes, insulin-dependent - Patient noncompliant with diet - Continue basal/bolus.  Blood glucose is extremely labile.  Avoid aggressive changes in regimen - Continue CBG monitoring --Glucoses hve been 201-257 this month.  Gastroparesis - Stable on current meds   Left internal jugular DVT - Continue Eliquis   Subarachnoid hemorrhage while on heparin - Stable now - Continue to monitor mental status   Prostate fluid collection - Dilated seminal vesicle and no surgical intervention needed per urology.  Will follow-up at discharge   Anemia of chronic disease Hemoglobin baseline appears close to be 8 - Stable for now - Trend CBC - Transfuse if falls below 7   BMI 26 - Outpatient follow up for lifestyle modification and risk factor management     DVT prophylaxis: Eliquis   Code Status: Full Code Family Communication: [name] at bedside and present for discussion. Disposition:  Status is: Inpatient, pending dispo planning    Consultants:    Nephrology Wound Care Neurosurgery  Consulting Physician: Delano Metz, MD   Antimicrobials:   I have seen and examined this patient myself. I have spent 34 minutes in his evaluation and care.  Shane Sitzmann, DO [facility] Direct contact: see [email or web address]  7PM-7AM contact night coverage as above 09/26/2023, 2:42 PM  LOS: 17 days   Procedures  S/P craniotomy, MV and TV endocarditis repair.  Dialysis  Antibiotics   Anti-infectives (From admission, onward)    Start     Dose/Rate Route Frequency Ordered Stop   09/24/23 1200  vancomycin (VANCOREADY) IVPB 500 mg/100 mL        500 mg 100 mL/hr over 60 Minutes Intravenous Every M-W-F (Hemodialysis) 09/21/23 1246 10/29/23 1159   09/20/23 1200  vancomycin (VANCOCIN) 750 mg in sodium chloride 0.9 % 250 mL IVPB  Status:  Discontinued        750 mg 265 mL/hr over 60 Minutes Intravenous Every M-W-F (Hemodialysis) 09/20/23 0446 09/21/23 1246   09/20/23 1045  vancomycin (VANCOCIN) 750 mg in sodium chloride 0.9 % 250 mL IVPB        750 mg 265 mL/hr over 60 Minutes Intravenous  Once 09/20/23 0959 09/20/23 1226   09/20/23 0945  vancomycin (VANCOREADY) IVPB 750 mg/150 mL  Status:  Discontinued        750 mg 150 mL/hr over 60 Minutes Intravenous  Once 09/20/23 0855 09/20/23 0959   09/10/23 1200  vancomycin (VANCOREADY) IVPB 750 mg/150 mL  Status:  Discontinued        750 mg 150 mL/hr over 60 Minutes Intravenous Every M-W-F (Hemodialysis) 09/09/23 2030 09/20/23 0446   09/09/23 2200  linezolid (ZYVOX) tablet 600 mg  Status:  Discontinued        600 mg Oral Every 12 hours 09/09/23 2035 09/10/23 0946        Interval History/Subjective  The patient is resting comfortably. No new complaints.   Objective   Vitals:  Vitals:   09/26/23 0818 09/26/23 1022  BP: (!) 114/46 111/79  Pulse: (!) 112 (!) 109  Resp: (!) 22 16  Temp: (!) 97.5 F (36.4 C) (!) 97.3 F (36.3 C)  SpO2: 100% 98%    Exam:  Constitutional:  The patient is awake, alert, and oriented x 3. No acute distress. Respiratory:  No increased work of breathing. No wheezes, rales, or rhonchi No tactile fremitus Cardiovascular:  Regular rate and rhythm No murmurs, ectopy, or  gallups. No lateral PMI. No thrills. Abdomen:  Abdomen is soft, non-tender, non-distended No hernias, masses, or organomegaly Normoactive bowel sounds.  Musculoskeletal:  No cyanosis, clubbing, or edema Skin:  No rashes, lesions, ulcers palpation of skin: no induration or nodules Stage II sacral decubitus ulcer. Neurologic:  CN 2-12 intact Sensation all 4 extremities intact He is moving all extremities. Psychiatric:  The patient is agitated and irate.  I have personally reviewed the following:   Today's Data  Vitals  Lab Data  CBC    Component Value Date/Time   WBC 6.4 09/26/2023 0231   RBC 2.57 (L) 09/26/2023 0231   HGB 7.4 (L) 09/26/2023 0231   HGB 9.7 (L) 08/22/2014 0447   HCT 23.7 (L) 09/26/2023 0231   HCT 30.0 (L) 08/22/2014 0447   PLT 168 09/26/2023 0231   PLT 158 08/22/2014 0447   MCV 92.2 09/26/2023 0231   MCV 97 08/22/2014 0447   MCH 28.8 09/26/2023 0231   MCHC 31.2 09/26/2023 0231   RDW 15.0 09/26/2023 0231   RDW 15.3 (H) 08/22/2014 0447   LYMPHSABS 1.0 09/26/2023 0231   LYMPHSABS 1.7 08/22/2014 0447   MONOABS 0.6 09/26/2023 0231   MONOABS 0.8 08/22/2014 0447   EOSABS 0.4 09/26/2023 0231   EOSABS 0.4 08/22/2014 0447   BASOSABS 0.0 09/26/2023 0231   BASOSABS 0.1 08/22/2014 0447      Latest Ref Rng & Units 09/26/2023    2:32 AM 09/25/2023    3:02 AM 09/24/2023    7:36 AM  BMP  Glucose 70 - 99 mg/dL 161  096  045   BUN 6 - 20 mg/dL 42  35  66   Creatinine 0.61 - 1.24 mg/dL 4.09  8.11  9.14   Sodium 135 - 145 mmol/L 136  135  131   Potassium 3.5 - 5.1 mmol/L 4.7  5.2  >7.5   Chloride 98 - 111 mmol/L 98  96  94   CO2 22 - 32 mmol/L 25  22  16    Calcium 8.9 - 10.3 mg/dL 9.2  9.3  9.2      Micro Data   Results for orders placed or performed during the hospital encounter of 09/09/23  MRSA Next Gen by PCR, Nasal     Status: None   Collection Time: 09/10/23  8:50 AM   Specimen: Nasal Mucosa; Nasal Swab  Result Value Ref Range Status   MRSA by  PCR Next Gen NOT DETECTED NOT DETECTED Final    Comment: (NOTE) The GeneXpert MRSA Assay (FDA approved for  NASAL specimens only), is one component of a comprehensive MRSA colonization surveillance program. It is not intended to diagnose MRSA infection nor to guide or monitor treatment for MRSA infections. Test performance is not FDA approved in patients less than 58 years old. Performed at [facility], [address] [place], [name] [address]     Scheduled Meds:  apixaban  5 mg Oral BID   bacitracin   Topical Daily   calcitRIOL  1.5 mcg Oral Q M,W,F-HD   Chlorhexidine Gluconate Cloth  6 each Topical Q0600   darbepoetin (ARANESP) injection - DIALYSIS  100 mcg Subcutaneous Q Sun-1800   gabapentin  100 mg Oral TID   Gerhardt's butt cream   Topical BID   influenza vac split trivalent PF  0.5 mL Intramuscular Tomorrow-1000   insulin aspart  0-6 Units Subcutaneous TID AC & HS   loratadine  10 mg Oral Daily   metoprolol tartrate  25 mg Oral BID   [START ON 09/27/2023] midodrine  10 mg Oral Q M,W,F-HD   pantoprazole  40 mg Oral Daily   povidone-Iodine   Topical Q0600   QUEtiapine  12.5 mg Oral q morning   QUEtiapine  25 mg Oral QHS   senna-docusate  2 tablet Oral BID   sevelamer carbonate  1,600 mg Oral TID WC   sodium zirconium cyclosilicate  10 g Oral TID   Continuous Infusions:  albumin human 25 g (09/24/23 1456)   heparin sodium (porcine)     vancomycin 500 mg (09/24/23 1555)    Principal Problem:   Endocarditis Active Problems:   MRSA bacteremia   Endocarditis of tricuspid valve   ESRD on dialysis ([name])   Cognitive impairment   Insulin dependent type 1 diabetes mellitus ([name])   Diabetic gastroparesis ([name])   Diabetic foot infection ([name])   Endocarditis of mitral valve   Cerebrovascular accident (CVA) due to embolism of cerebral artery ([name])   LOS: 17 days

## 2023-09-26 NOTE — Plan of Care (Signed)
°  Problem: Nutrition: Goal: Adequate nutrition will be maintained Outcome: Completed/Met   Problem: Elimination: Goal: Will not experience complications related to bowel motility Outcome: Completed/Met Goal: Will not experience complications related to urinary retention Outcome: Completed/Met   Problem: Pain Management: Goal: General experience of comfort will improve Outcome: Completed/Met   Problem: Safety: Goal: Ability to remain free from injury will improve Outcome: Completed/Met   Problem: Nutritional: Goal: Progress toward achieving an optimal weight will improve Outcome: Completed/Met

## 2023-09-27 DIAGNOSIS — R7881 Bacteremia: Secondary | ICD-10-CM | POA: Diagnosis not present

## 2023-09-27 DIAGNOSIS — I330 Acute and subacute infective endocarditis: Secondary | ICD-10-CM | POA: Diagnosis not present

## 2023-09-27 DIAGNOSIS — I38 Endocarditis, valve unspecified: Secondary | ICD-10-CM | POA: Diagnosis not present

## 2023-09-27 DIAGNOSIS — D631 Anemia in chronic kidney disease: Secondary | ICD-10-CM | POA: Diagnosis not present

## 2023-09-27 DIAGNOSIS — B9562 Methicillin resistant Staphylococcus aureus infection as the cause of diseases classified elsewhere: Secondary | ICD-10-CM | POA: Diagnosis not present

## 2023-09-27 DIAGNOSIS — N250 Renal osteodystrophy: Secondary | ICD-10-CM | POA: Diagnosis not present

## 2023-09-27 DIAGNOSIS — N186 End stage renal disease: Secondary | ICD-10-CM | POA: Diagnosis not present

## 2023-09-27 DIAGNOSIS — I120 Hypertensive chronic kidney disease with stage 5 chronic kidney disease or end stage renal disease: Secondary | ICD-10-CM | POA: Diagnosis not present

## 2023-09-27 DIAGNOSIS — Z992 Dependence on renal dialysis: Secondary | ICD-10-CM | POA: Diagnosis not present

## 2023-09-27 LAB — CBC WITH DIFFERENTIAL/PLATELET
Abs Immature Granulocytes: 0.05 10*3/uL (ref 0.00–0.07)
Basophils Absolute: 0 10*3/uL (ref 0.0–0.1)
Basophils Relative: 0 %
Eosinophils Absolute: 0.6 10*3/uL — ABNORMAL HIGH (ref 0.0–0.5)
Eosinophils Relative: 7 %
HCT: 29.2 % — ABNORMAL LOW (ref 39.0–52.0)
Hemoglobin: 8.9 g/dL — ABNORMAL LOW (ref 13.0–17.0)
Immature Granulocytes: 1 %
Lymphocytes Relative: 13 %
Lymphs Abs: 1.1 10*3/uL (ref 0.7–4.0)
MCH: 28.5 pg (ref 26.0–34.0)
MCHC: 30.5 g/dL (ref 30.0–36.0)
MCV: 93.6 fL (ref 80.0–100.0)
Monocytes Absolute: 0.6 10*3/uL (ref 0.1–1.0)
Monocytes Relative: 7 %
Neutro Abs: 6.3 10*3/uL (ref 1.7–7.7)
Neutrophils Relative %: 72 %
Platelets: 153 10*3/uL (ref 150–400)
RBC: 3.12 MIL/uL — ABNORMAL LOW (ref 4.22–5.81)
RDW: 15.3 % (ref 11.5–15.5)
WBC: 8.7 10*3/uL (ref 4.0–10.5)
nRBC: 0.5 % — ABNORMAL HIGH (ref 0.0–0.2)

## 2023-09-27 LAB — BASIC METABOLIC PANEL
Anion gap: 17 — ABNORMAL HIGH (ref 5–15)
BUN: 48 mg/dL — ABNORMAL HIGH (ref 6–20)
CO2: 23 mmol/L (ref 22–32)
Calcium: 8.9 mg/dL (ref 8.9–10.3)
Chloride: 94 mmol/L — ABNORMAL LOW (ref 98–111)
Creatinine, Ser: 7.33 mg/dL — ABNORMAL HIGH (ref 0.61–1.24)
GFR, Estimated: 9 mL/min — ABNORMAL LOW (ref >60)
Glucose, Bld: 290 mg/dL — ABNORMAL HIGH (ref 70–99)
Potassium: 5.2 mmol/L — ABNORMAL HIGH (ref 3.5–5.1)
Sodium: 134 mmol/L — ABNORMAL LOW (ref 135–145)

## 2023-09-27 LAB — GLUCOSE, CAPILLARY
Glucose-Capillary: 373 mg/dL — ABNORMAL HIGH (ref 70–99)
Glucose-Capillary: 324 mg/dL — ABNORMAL HIGH (ref 70–99)

## 2023-09-27 NOTE — Progress Notes (Signed)
Received patient in bed to unit.  Alert and oriented.  Informed consent signed and in chart.   TX duration: 3 hours  Patient tolerated well.  Transported back to the room  Alert, without acute distress.  Hand-off given to patient's nurse Leonette Monarch  Access used: Left femoral TDC Access issues: none  Total UF removed: 3L Medication(s) given: none Post HD VS: Bp 118/81 P 128 resp 17 02 sat 93% RA  temp 98.7 Post HD weight: unable to obtain in chair Unable to change dressing that is d/t be changed tomorrow. Access and dressing up against the chair Freddie Breech, RN Kidney Dialysis Unit

## 2023-09-27 NOTE — Progress Notes (Signed)
Pharmacy Antibiotic Note  [name] is a 42 y.o. 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 Pt was transferred to [facility] for MV repair 11/29 and craniotomy with abscess drainage on 12/4. Pharmacy has been consulted for continued Vancomycin dosing.  Spoke with pharmacist at [facility], vancomycin was being dose based on pre-HD levels - pt mostly getting 750mg  with HD but occasionally would get 500mg  instead. Last vanc random (pre-HD level) at [facility] of 19.8 mcg/ml on 12/11 (therapeutic for goal 15-25 mcg/ml) and pt received vanc 750mg  IV 12/11 pm post HD  12/30 AM update: Patient remains on vancomycin 500 mg IV post-HD MWF. Tolerated last HD session on 12/27.   Plan: - Continue Vancomycin 500 mg IV post HD MWF through 10/27/23 - Obtain pre-HD level prior to HD on Friday, 1/3 - Will monitor HD tolerance and dosing - F/u ID recommendations  Height: 5\' 7"  (170.2 cm) Weight: 90.2 kg (198 lb 13.7 oz) IBW/kg (Calculated) : 66.1  Temp (24hrs), Avg:98 F (36.7 C), Min:97.3 F (36.3 C), Max:98.9 F (37.2 C)  Recent Labs  Lab 09/21/23 0620 09/21/23 1020 09/22/23 0559 09/23/23 6045 09/24/23 0736 09/24/23 1313 09/25/23 0301 09/25/23 0302 09/26/23 0231 09/26/23 0232 09/27/23 0250 09/27/23 0434  WBC 6.4  --   --   --   --  9.8 6.4  --  6.4  --  8.7  --   CREATININE 5.80*  --    < > 6.45* 7.71*  --   --  5.14*  --  6.39*  --  7.33*  VANCORANDOM  --  27  --   --   --   --   --   --   --   --   --   --    < > = values in this interval not displayed.    Estimated Creatinine Clearance: 13.8 mL/min (A) (by C-G formula based on SCr of 7.33 mg/dL (H)).    No Known Allergies  Antimicrobials this admission: Vancomycin 10/12 >>(10/27/23) Cefepime 10/21 >> 10/23; restart 10/25 >> 10/29 Linezolid 11/27 >> 12/10   Vancomycin levels  -10/17 preHD>>25 -10/24: VR= 16 -11/1: VR= 24 -11/8 VR 22 -11/15 VR 22  -11/22 VR 28 - held dose x1, decrease  to 500mg  -11/28 VR 18 - cont 500mg  MWF 11/29 - Transferred to [facility] -12/11 VR 19.8 (at [facility]) - mostly 750mg  with HD (occasionally 500mg  instead) - 12/16 VR 25 mcg/mL- continue 750 mg iv MWF - 12/24 VR 27 mcg/mL- hold 1 dose / reduce to 500 mg iv MWF thereafter   Microbiology results: 10/12 MRSA PCR >> positive 10/12 CDiff antigen +, toxin -, PCR - >> negative 10/12 BCx >> 4/4 MRSA 10/13 BCx >> Negative 10/17 BCx >> Negative 10/21 RCx >> Negative 11/26 Bcx >> negative 11/27 Bcx >> Negative  Lennie Muckle, PharmD PGY1 Pharmacy Resident 09/27/2023 9:02 AM

## 2023-09-27 NOTE — Progress Notes (Signed)
Update provided to out-pt HD clinic regarding pt tolerating HD in chair on Friday per documentation and that pt to receive HD in chair today as well. SNF placement pending. Will assist as needed.   Olivia Canter Renal Navigator [phone]

## 2023-09-27 NOTE — Progress Notes (Signed)
Patient verbally abusive towards staff. Patient RN and unit AD tried to calm patient and talk with him but he is not willing to listen. Patient left alone to calm down, will check back on him in 30 minutes.

## 2023-09-27 NOTE — Progress Notes (Signed)
Mobility Specialist Progress Note:    09/27/23 1242  Mobility  Activity Transferred from bed to chair  Level of Assistance Maximum assist, patient does 25-49% (+3)  Assistive Device MaxiMove  Activity Response Tolerated well  Mobility Referral Yes  Mobility visit 1 Mobility  Mobility Specialist Start Time (ACUTE ONLY) 1055  Mobility Specialist Stop Time (ACUTE ONLY) 1122  Mobility Specialist Time Calculation (min) (ACUTE ONLY) 27 min   Pt received in bed nursing staff requesting assistance getting pt to the ND chair. Pt was hesitant using Maximove, after max encouragement pt agreeable to transfer. Left in chair w/ all needs met. RN in room.   Thompson Grayer Mobility Specialist  Please contact vis Secure Chat or  Rehab Office [phone]

## 2023-09-27 NOTE — TOC Progression Note (Addendum)
Transition of Care [facility]) - Progression Note    Patient Details  Name: [name] MRN: [id number] Date of Birth: 1980/10/01  Transition of Care [facility]) CM/SW Contact  Michaela Corner, [place] Phone Number: 09/27/2023, 11:39 AM  Clinical Narrative:   CSW spoke with pts mother [name]) via phone as she is not at the hospital at this time and verbally went over SNF list w/ accepting facilitiesc ([facility], [facility], and [facility]). At this time, Ms. Macneill does not want to go with any of the options listed. Ms. Schmit inquired about a referral being sent to [facility]. CSW will fax referral to [facility] and follow up.            Skilled Nursing Rehab Facilities-   [email or web address]     Ratings out of 5 stars (5 the highest)    Name Address  Phone # Quality Care Staffing Health Inspection Overall  [facility] [address] [phone] 2 2 5 5   [facility] [address] [place] [phone] 4 2 4 4   [facility] [address], [facility] [phone] 2 1 2 1   [facility] [address] [place] [phone] 3 1 4 3   [facility]  [address], [place] [phone] 4 4 5 5   [facility] [address] [facility] [phone] 3 2 2 2   [facility] [address] [place] [phone] 5 1 2 2   [facility] [address] [place] [phone] 1 1 3 1   [address] ([facility]) [address], [place] [phone] 2 2 2 2   [facility] [address] [place], [place] [phone] 2 2 1 1   [facility]) [address], [place] [phone] 3 1 1 1   [facility]igh[facility]ideg[facility] [address] Liliane Shi [phone] 3 3 4 4   [facility] [address] [place] [phone] 2 2 3 3                  [facility] [address] [place] [phone] 4 2 1 1   [facility], [name] [address], [place]  [phone] 1 1 2 1   [facility] [address] [place] [phone] 2 1 4 3   [facility] [place] [address], Cheree Ditto [phone] 2 1 4 3   [facility] [address], [place] [phone] 2 3 3 3                  [address] (no [facility]) [address], [place] [phone] 4 5 5 5   [facility] (No [employer]) [address] Lavera Guise [phone] 1 2 4 3   [facility] [address], [place] [phone] 2 1 2 1   [facility] (No [facility]) [address], [place] [phone] 4 1 5 4   [facility] [address] [place] [phone] 3 4 2 2   [facility] [address], [place] [phone] 2 1 1 1   [facility] [address] Liliane Shi [phone] 4 2 5 5   [facility]  [address] [place] [phone] 2 2 3 3   [facility] [address] [place] [phone] 4 1 1 1   [facility] [address] [place], [place] [phone] 2 2 3 3                  [facility] [address], [place] [phone] 2 1 1 1   [facility] [address] Evlyn Clines  [phone] 3 3 3  3  [facility] (No [employer]) [address], [place] [phone] 2 2 4 4   [facility]) [address], [place]alita Levan [phone] 2 1 1 1   [facility] [address] [place] Levan [phone] 4 3 5 5   [facility] [place] [address], [place] [phone] 1 1 1 1                  [facility] [address] [place], [place] [phone] 5 4 5 5   [facility])  [address] [place] [phone] 1 1 2 1   [facility]) [address] [place] [phone]   2 4 4   [facility] [address] [place] [phone] 3 5 5 5   [address] [place], [place] [phone] 4 2 2 2   [facility]) [address] [place] [phone] 2 1 3 2             Expected Discharge Plan and Services                                                Social Determinants of Health (SDOH) Interventions SDOH Screenings   Food Insecurity: No Food Insecurity (09/09/2023)  Housing: Unknown (09/09/2023)  Transportation Needs: No Transportation Needs (09/09/2023)  Utilities: Not At Risk (09/09/2023)  Alcohol Screen: Low Risk  (02/15/2023)  Depression (PHQ2-9): Low Risk  (02/15/2023)  Financial Resource Strain: Low Risk  (02/15/2023)  Physical Activity: Inactive (02/15/2023)  Social Connections: Socially Integrated (02/15/2023)  Stress: No Stress Concern Present (02/15/2023)  Tobacco Use: Low Risk  (09/12/2023)  Recent Concern: Tobacco Use - Medium Risk (09/01/2023)   [facility] from [facility]    Readmission Risk Interventions     No data to display

## 2023-09-27 NOTE — Inpatient Diabetes Management (Signed)
Inpatient Diabetes Program Recommendations  AACE/ADA: New Consensus Statement on Inpatient Glycemic Control (2015)  Target Ranges:  Prepandial:   less than 140 mg/dL      Peak postprandial:   less than 180 mg/dL (1-2 hours)      Critically ill patients:  140 - 180 mg/dL   Lab Results  Component Value Date   GLUCAP 375 (H) 09/26/2023   HGBA1C 8.9 (H) 07/10/2023    Review of Glycemic Control  Diabetes history: DM1 Outpatient Diabetes medications: Lantus 4 daily, Humalog 0-4 TID Current orders for Inpatient glycemic control: Novolog 0-6 TID with meals and 0-5 HS  CBGs 12/29 - 201- 375. 290 this am.  Inpatient Diabetes Program Recommendations:    Semglee 3 units every day Novolog 2 units TID if he eats at least 50%  Continue to follow while inpatient.  Thank you. Ailene Ards, RD, LDN, CDCES Inpatient Diabetes Coordinator [phone]

## 2023-09-27 NOTE — Progress Notes (Signed)
Patient ID: [name], male   DOB: June 24, 1981, 42 y.o.   MRN: [id number] S: Pt concerned about not having PT/OT, however he did have several sessions and last was on 09/24/23.  We discussed the holiday and weekend schedule.  Also concerned about left arm swelling and would like a sling to help elevate his arm. O:BP 125/79 (BP Location: Right Arm)   Pulse (!) 104   Temp 98.9 F (37.2 C) (Oral)   Resp 10   Ht 5\' 7"  (1.702 m)   Wt 90.2 kg   SpO2 100%   BMI 31.15 kg/m   Intake/Output Summary (Last 24 hours) at 09/27/2023 0942 Last data filed at 09/26/2023 1800 Gross per 24 hour  Intake 240 ml  Output 2 ml  Net 238 ml   Intake/Output: I/O last 3 completed shifts: In: 558.5 [P.O.:360; IV Piggyback:198.5] Out: 2 [Stool:2]  Intake/Output this shift:  No intake/output data recorded. Weight change:  Gen: NAD CVS: tachy Resp: CTA Abd: +BS, soft, NT/ND Ext: 2+ edema of LUE, now lower extremity edema, s/p LAKA Dialysis access:  left femoral [facility]  Recent Labs  Lab 09/20/23 1258 09/21/23 0620 09/22/23 0559 09/23/23 0633 09/24/23 0736 09/25/23 0302 09/26/23 0232 09/27/23 0434  NA 134* 134* 134* 136 131* 135 136 134*  K 5.3* 6.3* 5.8* 6.0* >7.5* 5.2* 4.7 5.2*  CL 95* 97* 96* 97* 94* 96* 98 94*  CO2 27 25 24 26  16* 22 25 23   GLUCOSE 55* 223* 355* 72 285* 235* 266* 290*  BUN 37* 47* 41* 50* 66* 35* 42* 48*  CREATININE 4.89* 5.80* 5.44* 6.45* 7.71* 5.14* 6.39* 7.33*  ALBUMIN 2.6* 2.6* 2.6* 2.6* 2.6* 2.8* 2.6*  --   CALCIUM 9.1 9.5 9.0 9.2 9.2 9.3 9.2 8.9  PHOS 6.8* 8.5* 8.2* 8.9* 11.3* 6.6* 8.1*  --    Liver Function Tests: Recent Labs  Lab 09/24/23 0736 09/25/23 0302 09/26/23 0232  ALBUMIN 2.6* 2.8* 2.6*   No results for input(s): "LIPASE", "AMYLASE" in the last 168 hours. No results for input(s): "AMMONIA" in the last 168 hours. CBC: Recent Labs  Lab 09/21/23 0620 09/24/23 1313 09/25/23 0301 09/26/23 0231 09/27/23 0250  WBC 6.4 9.8 6.4 6.4 8.7  NEUTROABS 4.2   --  5.0 4.3 6.3  HGB 9.1* 7.9* 7.7* 7.4* 8.9*  HCT 29.3* 25.7* 25.0* 23.7* 29.2*  MCV 92.7 91.8 92.6 92.2 93.6  PLT 189 181 154 168 153   Cardiac Enzymes: No results for input(s): "CKTOTAL", "CKMB", "CKMBINDEX", "TROPONINI" in the last 168 hours. CBG: Recent Labs  Lab 09/25/23 2328 09/26/23 0627 09/26/23 1024 09/26/23 1750 09/26/23 2116  GLUCAP 259* 233* 201* 288* 375*    Iron Studies: No results for input(s): "IRON", "TIBC", "TRANSFERRIN", "FERRITIN" in the last 72 hours. Studies/Results: No results found.  apixaban  5 mg Oral BID   bacitracin   Topical Daily   calcitRIOL  1.5 mcg Oral Q M,W,F-HD   Chlorhexidine Gluconate Cloth  6 each Topical Q0600   darbepoetin (ARANESP) injection - DIALYSIS  100 mcg Subcutaneous Q Sun-1800   gabapentin  100 mg Oral TID   Gerhardt's butt cream   Topical BID   influenza vac split trivalent PF  0.5 mL Intramuscular Tomorrow-1000   insulin aspart  0-6 Units Subcutaneous TID AC & HS   loratadine  10 mg Oral Daily   metoprolol tartrate  25 mg Oral BID   midodrine  10 mg Oral Q M,W,F-HD   pantoprazole  40 mg Oral Daily  povidone-Iodine   Topical Q0600   QUEtiapine  12.5 mg Oral q morning   QUEtiapine  25 mg Oral QHS   senna-docusate  2 tablet Oral BID   sevelamer carbonate  1,600 mg Oral TID WC   sodium zirconium cyclosilicate  10 g Oral TID    BMET    Component Value Date/Time   NA 134 (L) 09/27/2023 0434   NA 138 08/22/2014 0447   K 5.2 (H) 09/27/2023 0434   K 4.0 08/22/2014 0447   CL 94 (L) 09/27/2023 0434   CL 100 08/22/2014 0447   CO2 23 09/27/2023 0434   CO2 30 08/22/2014 0447   GLUCOSE 290 (H) 09/27/2023 0434   GLUCOSE 88 08/22/2014 0447   BUN 48 (H) 09/27/2023 0434   BUN 14 08/22/2014 0447   CREATININE 7.33 (H) 09/27/2023 0434   CREATININE 5.00 (H) 08/22/2014 0447   CALCIUM 8.9 09/27/2023 0434   CALCIUM 8.2 (L) 08/22/2014 0447   GFRNONAA 9 (L) 09/27/2023 0434   GFRNONAA 14 (L) 08/22/2014 0447   GFRNONAA 5 (L)  06/19/2014 0937   GFRAA 7 (L) 05/30/2020 1923   GFRAA 17 (L) 08/22/2014 0447   GFRAA 6 (L) 06/19/2014 0937   CBC    Component Value Date/Time   WBC 8.7 09/27/2023 0250   RBC 3.12 (L) 09/27/2023 0250   HGB 8.9 (L) 09/27/2023 0250   HGB 9.7 (L) 08/22/2014 0447   HCT 29.2 (L) 09/27/2023 0250   HCT 30.0 (L) 08/22/2014 0447   PLT 153 09/27/2023 0250   PLT 158 08/22/2014 0447   MCV 93.6 09/27/2023 0250   MCV 97 08/22/2014 0447   MCH 28.5 09/27/2023 0250   MCHC 30.5 09/27/2023 0250   RDW 15.3 09/27/2023 0250   RDW 15.3 (H) 08/22/2014 0447   LYMPHSABS 1.1 09/27/2023 0250   LYMPHSABS 1.7 08/22/2014 0447   MONOABS 0.6 09/27/2023 0250   MONOABS 0.8 08/22/2014 0447   EOSABS 0.6 (H) 09/27/2023 0250   EOSABS 0.4 08/22/2014 0447   BASOSABS 0.0 09/27/2023 0250   BASOSABS 0.1 08/22/2014 0447    OP HD: [facility] MWF  4h   400/1.5  77kg  L fem TDC 2/3 bath  Heparin none   Assessment/ Plan: 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 Not a candidate for surgery here. SP line holiday w/ new TDC placed 10/18. Continues on IV vanc upon return from [place] . Adjusted plan per ID pharm is 750mg  MWF through 10/27/23.  Hyperkalemia - Pt not adhering to renal diet though sign on door with specific instructions for dietary.  Lokelma has been increased to BID, remains high increase to TID.  Follow daily labs - K mid 4s now, cont TID lokelma.  SP I&D brain abscess: [facility] 12/04 at [facility]. Mentation improved. 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 Next HD today - BP has been on low side, will add pre HD midodrine.  Volume: on RA, L arm + edema, no other edema. UF as tolerated with HD Anemia: Hb 8-9s, on ESA. Hold iron infusion while on IV ABX. 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 Cont binders w/ meals.  Hypertension: BP on low side recently, on low dose BB w h/o atrial arrhythmia.   HD access:  L  femoral TDC placed on 10/18. Pt has occlusion of  bilateral subclavian veins limiting his options for dialysis access. Type 1 DM: per primary Dispo - tolerated full HD 12/27 in chair.  Cont efforts to dialyze in chair.  Appears SNF being pursued  Irena Cords, MD [facility]

## 2023-09-27 NOTE — Progress Notes (Signed)
PROGRESS NOTE [name]  WJX:[id number] DOB: June 14, 1981 DOA: 09/09/2023 PCP: [name], NP  Brief Narrative/Hospital Course: [name] is a 42 y.o. male with medical history significant of ESRD on hemodialysis (not fully compliant), insulin-dependent type 1 diabetes, gastroparesis, Left AKA, admitted to [facility] on 07/09/23 for DKA, Sepsis and hospital course significant for MV AND TV  MRSA endocarditis, followed by acute MCA stroke , left occipital and bilateral cerebellar strokes consistent with septic emboli,  Brain abscess from MRSA, left IJ DVT was transferred to [facility] for MV repair on 11/29.  While at [facility] he underwent cardiac cath showing non obstructive disease, craniotomy with abscess drainage on 09/01/23,. He had subarachnoid hemorrhage while on IV heparin, right third toe distal necrosis,was seen by podiatry, but family wanted conservative management.  Since his tertiary care needs have been completed, he was transferred back to [facility] for further management.  PTOT  recommending SNF and awaiting placement.    Subjective: Seen and examined, Overnight vitals/labs/events reviewed Upset about not getting PTOT over the few days since 12/27 but apolozied after realizing it was holiday weekend asking about PORT which someone mentioned few days ago.  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 last ID note reviewed from 12/16 by Dr. 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  Plan is to finish 8 weeks of vancomycin 3x weekly w/ HD EOT 10/26/22. Will need outpatient urology follow-up for prostatic abscess/[facility]?. ID clinic follow-up arranged for 1/14 @ 9.45 am Dr Thedore Mins- ID signed off 12/16  ESRD on HD Hyperkalemia Anemia of CKD CKD MBD: Continue HD per nephrology MWF-BP has been on the low side, added pre-HD midodrine, continue ASA, iron infusion held due to IV antibiotics and Hb stable.  Monitor calcium and Phos and continue binders with meals.  Discussed with Dr. Arrie Aran this morning.  Acute bilateral CVA: Suspected to be due to septic emboli.  Hypertension: BP on lower side on low-dose beta-blocker due to atrial arrhythmia, PSD midodrine added.  Left upper extremity swelling Acute DVT of left intrajugular vein left subclavian and left axillary vein on 08/01/26: Elevate left upper arm.  Continue Eliquis  Right third toe necrosis: Seen by podiatry at [facility] recommended surgical intervention but family opted for conservative measurement on antibiotics, wound care  SAH while on heparin: Stable now tolerating Eliquis  Type 1 diabetes mellitus with uncontrolled hyperglycemia, poor compliance Gastroparesis: Patient was very agitated tingling and modified diet show on regular diet.  Blood sugar potassium uncontrolled. Continue SSI .  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  Continue PT OT as able.  Recommending skilled nursing facility upon discharge.  Patient has been previously seen by palliative care remains full code Is at high risk for readmission decompensation due to his noncompliance complex medical comorbidities, overall poor baseline functional status and ongoing decline.  Previously he mentioned he is willing to go to SNF if he can "come and go" as he wants.  Having waxing and waning mental status.  Mother is frequently involved in his care either at bedside or on the phone while rounding.  Psych issues ?  Cognitive impairment: Patient seems to be noncompliant, has been cussing at staffs and verbally aggressive at times, spit in a RN eye earlier in the admission. Suspect has underlying baseline psych issues.  Psychiatry has been consulted. On Seroquel 12.5 AM and 25 bedtime.  Appears she was on Klonopin 0.25 prn anxiety PTA but not receiving currently.  Obesity: Patient's Body mass index is 31.15 kg/m. : Will benefit with PCP follow-up, weight loss  healthy lifestyle and outpatient sleep evaluation.  Consultation involved: Nephrology Neurosurgery Neurology Palliative care IR Cardiology/EP Cardiothoracic surgery Infectious disease Psychiatry 12/30  DVT prophylaxis:  Code Status:   Code Status: Full Code Family Communication: plan of care discussed with patient/mother updated on phone Patient status is: Remains hospitalized because of severity of illness Level of care: Progressive   Dispo: The patient is from: Home, lives with his mother.            Anticipated disposition: TBD-awaiting for skilled nursing facility Objective: Vitals last 24 hrs: Vitals:   09/26/23 1700 09/26/23 1932 09/27/23 0024 09/27/23 0520  BP: (!) 125/95 (!) 125/92 (!) 138/95 125/79  Pulse: (!) 106 (!) 107 (!) 108 (!) 104  Resp: 16 14 18 10   Temp: 97.9 F (36.6 C) 97.9 F (36.6 C) 98.2 F (36.8 C) 98.9  F (37.2 C)  TempSrc: Oral Oral Oral Oral  SpO2: 100% 100% 99% 100%  Weight:    90.2 kg  Height:       Weight change:   Physical Examination: General exam: alert awake, appears older than stated age.  HEENT:Oral mucosa moist, Ear/Nose WNL grossly Respiratory system: Bilaterally clear BS,no use of accessory muscle Cardiovascular system: S1 & S2 +, No JVD. Gastrointestinal system: Abdomen soft,NT,ND, BS+ Nervous System: Alert, awake, moving all extremities LUE swollen chronic appearing, LT BKA Extremities: LE edema neg,distal peripheral pulses palpable and warm.  Skin: No rashes,no icterus. MSK: Normal muscle bulk,tone, power  Left groin with HD cath, c/d/I with dressing  Medications reviewed:  Scheduled Meds:  apixaban  5 mg Oral BID   bacitracin   Topical Daily   calcitRIOL  1.5 mcg Oral Q M,W,F-HD   Chlorhexidine Gluconate Cloth  6 each Topical Q0600   darbepoetin (ARANESP) injection - DIALYSIS  100 mcg Subcutaneous Q Sun-1800   gabapentin  100 mg Oral TID   Gerhardt's butt cream   Topical BID   influenza vac split trivalent PF  0.5 mL Intramuscular Tomorrow-1000   insulin aspart  0-6 Units Subcutaneous TID AC & HS   loratadine  10 mg Oral Daily   metoprolol tartrate  25 mg Oral BID   midodrine  10 mg Oral Q M,W,F-HD   pantoprazole  40 mg Oral Daily   povidone-Iodine   Topical Q0600   QUEtiapine  12.5 mg Oral q morning   QUEtiapine  25 mg Oral QHS   senna-docusate  2 tablet Oral BID   sevelamer carbonate  1,600 mg Oral TID WC   sodium zirconium cyclosilicate  10 g Oral TID   Continuous Infusions:  albumin human 25 g (09/24/23 1456)   heparin sodium (porcine)     vancomycin 500 mg (09/24/23 1555)   Diet Order             Diet regular Room service appropriate? Yes; Fluid consistency: Thin; Fluid restriction: 1500 mL Fluid  Diet effective now                  Intake/Output Summary (Last 24 hours) at 09/27/2023 1011 Last data filed at 09/26/2023 1800 Gross  per 24 hour  Intake 120 ml  Output 1 ml  Net 119 ml   Net IO Since Admission: -3,365.04 mL [09/27/23 1011]  Wt Readings from Last 3 Encounters:  09/27/23 90.2 kg  08/24/23 78 kg  04/14/23 (S) 77.6 kg     Unresulted Labs (From admission, onward)     Start     Ordered   Signed and Held  Renal function panel  Once,   R       Question:  Specimen collection method  Answer:  Lab=Lab collect   Signed and Held          Data Reviewed: I have personally reviewed following labs and imaging studies CBC: Recent Labs  Lab 09/20/23 1258 09/21/23 0620 09/24/23 1313 09/25/23 0301 09/26/23 0231 09/27/23 0250  WBC 7.3 6.4 9.8 6.4 6.4 8.7  NEUTROABS 5.1 4.2  --  5.0 4.3 6.3  HGB 8.3* 9.1* 7.9* 7.7* 7.4* 8.9*  HCT 27.1* 29.3* 25.7* 25.0* 23.7* 29.2*  MCV 94.1 92.7 91.8 92.6 92.2 93.6  PLT 203 189 181 154 168 153   Basic Metabolic Panel:  Recent Labs  Lab 09/22/23 0559 09/23/23 0633 09/24/23 0736 09/25/23 0302 09/26/23 0232 09/27/23 0434  NA 134* 136 131* 135 136 134*  K 5.8* 6.0* >7.5* 5.2* 4.7 5.2*  CL 96* 97* 94* 96* 98 94*  CO2 24 26 16* 22 25 23   GLUCOSE 355* 72 285* 235* 266* 290*  BUN 41* 50* 66* 35* 42* 48*  CREATININE 5.44* 6.45* 7.71* 5.14* 6.39* 7.33*  CALCIUM 9.0 9.2 9.2 9.3 9.2 8.9  MG 1.8 2.0 2.0 1.8 1.9  --   PHOS 8.2* 8.9* 11.3* 6.6* 8.1*  --    GFR: Estimated Creatinine Clearance: 13.8 mL/min (A) (by C-G formula based on SCr of 7.33 mg/dL (H)). Liver Function Tests:  Recent Labs  Lab 09/22/23 0559 09/23/23 0633 09/24/23 0736 09/25/23 0302 09/26/23 0232  ALBUMIN 2.6* 2.6* 2.6* 2.8* 2.6*   Recent Labs  Lab 09/25/23 2328 09/26/23 0627 09/26/23 1024 09/26/23 1750 09/26/23 2116  GLUCAP 259* 233* 201* 288* 375*  Antimicrobials/Microbiology: Anti-infectives (From admission, onward)    Start     Dose/Rate Route Frequency Ordered Stop   09/24/23 1200  vancomycin (VANCOREADY) IVPB 500 mg/100 mL        500 mg 100 mL/hr over 60 Minutes Intravenous  Every M-W-F (Hemodialysis) 09/21/23 1246 10/29/23 1159   09/20/23 1200  vancomycin (VANCOCIN) 750 mg in sodium chloride 0.9 % 250 mL IVPB  Status:  Discontinued        750 mg 265 mL/hr over 60 Minutes Intravenous Every M-W-F (Hemodialysis) 09/20/23 0446 09/21/23 1246   09/20/23 1045  vancomycin (VANCOCIN) 750 mg in sodium chloride 0.9 % 250 mL IVPB  750 mg 265 mL/hr over 60 Minutes Intravenous  Once 09/20/23 0959 09/20/23 1226   09/20/23 0945  vancomycin (VANCOREADY) IVPB 750 mg/150 mL  Status:  Discontinued        750 mg 150 mL/hr over 60 Minutes Intravenous  Once 09/20/23 0855 09/20/23 0959   09/10/23 1200  vancomycin (VANCOREADY) IVPB 750 mg/150 mL  Status:  Discontinued        750 mg 150 mL/hr over 60 Minutes Intravenous Every M-W-F (Hemodialysis) 09/09/23 2030 09/20/23 0446   09/09/23 2200  linezolid (ZYVOX) tablet 600 mg  Status:  Discontinued        600 mg Oral Every 12 hours 09/09/23 2035 09/10/23 0946         Component Value Date/Time   SDES BLOOD SITE NOT SPECIFIED 08/25/2023 1706   SPECREQUEST  08/25/2023 1706    BOTTLES DRAWN AEROBIC ONLY Blood Culture results may not be optimal due to an inadequate volume of blood received in culture bottles   CULT  08/25/2023 1706    NO GROWTH 5 DAYS Performed at [facility], [address] [place], [name] [address]    REPTSTATUS 08/30/2023 FINAL 08/25/2023 1706     Radiology Studies: No results found.   LOS: 18 days   Total time spent in review of labs and imaging, patient evaluation, formulation of plan, documentation and communication with family: 50 minutes  Lanae Boast, MD [facility]  09/27/2023, 10:11 AM

## 2023-09-28 DIAGNOSIS — R7881 Bacteremia: Secondary | ICD-10-CM | POA: Diagnosis not present

## 2023-09-28 DIAGNOSIS — Z0279 Encounter for issue of other medical certificate: Secondary | ICD-10-CM | POA: Diagnosis not present

## 2023-09-28 DIAGNOSIS — B9562 Methicillin resistant Staphylococcus aureus infection as the cause of diseases classified elsewhere: Secondary | ICD-10-CM | POA: Diagnosis not present

## 2023-09-28 DIAGNOSIS — I330 Acute and subacute infective endocarditis: Secondary | ICD-10-CM | POA: Diagnosis not present

## 2023-09-28 DIAGNOSIS — Z992 Dependence on renal dialysis: Secondary | ICD-10-CM | POA: Diagnosis not present

## 2023-09-28 DIAGNOSIS — N250 Renal osteodystrophy: Secondary | ICD-10-CM | POA: Diagnosis not present

## 2023-09-28 DIAGNOSIS — I38 Endocarditis, valve unspecified: Secondary | ICD-10-CM | POA: Diagnosis not present

## 2023-09-28 DIAGNOSIS — I120 Hypertensive chronic kidney disease with stage 5 chronic kidney disease or end stage renal disease: Secondary | ICD-10-CM | POA: Diagnosis not present

## 2023-09-28 DIAGNOSIS — N186 End stage renal disease: Secondary | ICD-10-CM | POA: Diagnosis not present

## 2023-09-28 DIAGNOSIS — D631 Anemia in chronic kidney disease: Secondary | ICD-10-CM | POA: Diagnosis not present

## 2023-09-28 LAB — GLUCOSE, CAPILLARY
Glucose-Capillary: 348 mg/dL — ABNORMAL HIGH (ref 70–99)
Glucose-Capillary: 345 mg/dL — ABNORMAL HIGH (ref 70–99)
Glucose-Capillary: 210 mg/dL — ABNORMAL HIGH (ref 70–99)
Glucose-Capillary: 317 mg/dL — ABNORMAL HIGH (ref 70–99)

## 2023-09-28 MED ORDER — INSULIN GLARGINE-YFGN 100 UNIT/ML ~~LOC~~ SOLN
2.0000 [IU] | Freq: Every day | SUBCUTANEOUS | Status: DC
  Administered 2023-09-28 – 2023-09-29 (×2): 2 [IU] via SUBCUTANEOUS
  Filled 2023-09-28 (×2): qty 0.02

## 2023-09-28 NOTE — Progress Notes (Signed)
 Physical Therapy Treatment Patient Details Name: [name] MRN: [id number] DOB: August 18, 1981 Today's Date: 09/28/2023   History of Present Illness 42 y.o. male presents to [facility] 09/09/23 as a transfer from [facility] for further management of endocarditis. Pt originally was admitted to [facility] 07/09/23 for DKA and sepsis after being found on the ground. 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 10/17 pt had a R MCA, L occipital, and B cerebellum CVA w/ septic emboli. ICU admit 10/18 w/ intubation 10/18-10/24. Pt was transferred to [facility] for MV repair on 11/29. At [facility], pt had cardiac cath w/ non obstructive disease, SAH, a craniotomy w/ abscess drainage on 12/4, and R third toe distal necrosis. PMH: chronic L AKA, MRSA, IDDM, ESRD on HD MWF, HTN, TEE positive for vegetation on multiple valves, cellulitis, medical noncompliance, narcotic dependence, DM with gastroparesis, L internal jugular DVT.    PT Comments  Patient resting in bed and agreeable to mobilize with therapy but stating he does not want to get to the recliner today as he felt he was up too long yesterday. Pt required Mod cues to initiate Rt knee flex and Rt UE reach to roll Lt, Max assist with bed pad to facilitate roll. Initially max assist at EOB for seated balance. Cues for Rt hand placement and Rt forearm prop completed for weight shifting balance activity. Following weight shifting pt stated he was too tired to do more and requesting return to bed. Rolling completed for bed pad placement. EOS Alarm on and call bell within reach. Patient will benefit from continued inpatient follow up therapy, <3 hours/day. Will continue to progress pt as able.      If plan is discharge home, recommend the following: Two people to help with walking and/or transfers;Assist for transportation;Supervision due to cognitive status;Help with stairs or ramp for entrance;Two people to help with  bathing/dressing/bathroom;Assistance with cooking/housework;Direct supervision/assist for financial management;Direct supervision/assist for medications management   Can travel by private vehicle     No  Equipment Recommendations  Hoyer lift;Wheelchair (measurements PT);Wheelchair cushion (measurements PT);Other (comment) (roho cushion; would be good to have wheelchair with auto-pressure relief function and one-arm drive vs electric wheelchair pending progress)    Recommendations for Other Services Rehab consult     Precautions / Restrictions Precautions Precautions: Fall Precaution Comments: Contact precs; L AKA, L hemiparesis and edema, L neglect, bowel incontinence Restrictions Weight Bearing Restrictions Per Provider Order: No LLE Weight Bearing Per Provider Order: Weight bearing as tolerated Other Position/Activity Restrictions: Pt has sling for LUE when mobilizing OOB; LLE prosthetic not fitting     Mobility  Bed Mobility Overal bed mobility: Needs Assistance Bed Mobility: Rolling, Sit to Supine, Sidelying to Sit Rolling: Max assist Sidelying to sit: Max assist, +2 for safety/equipment, HOB elevated, Used rails   Sit to supine: +2 for physical assistance, +2 for safety/equipment, Total assist   General bed mobility comments: Max assist for rolling with cues to flex Rt knee and mod assist to reach Rt UE across body to bed rail. Max assist with bed pad to facilitate roll and Max +2 to raise trunk upright and stabilize seated balance. pt initially required Mod assist for seated balance fading to min assist. Max/Total +2 to return to supine and boost superior to reposition.    Transfers                   General transfer comment: pt declined    Ambulation/Gait  Stairs             Wheelchair Mobility     Tilt Bed    Modified Rankin (Stroke Patients Only)       Balance Overall balance assessment: Needs  assistance Sitting-balance support: Single extremity supported, Feet supported Sitting balance-[name]: Poor Sitting balance - Comments: required mod to maxA assist for sitting balance on EOB despite single UE support, pt with lean toward his weaker L side throughout sitting Postural control: Posterior lean, Left lateral lean                                  Cognition Arousal: Alert Behavior During Therapy: Flat affect Overall Cognitive Status: Impaired/Different from baseline Area of Impairment: Attention, Memory, Following commands, Safety/judgement, Awareness, Problem solving                   Current Attention Level: Focused Memory: Decreased short-term memory Following Commands: Follows one step commands with increased time Safety/Judgement: Decreased awareness of safety, Decreased awareness of deficits Awareness: Intellectual Problem Solving: Slow processing, Difficulty sequencing, Requires verbal cues, Requires tactile cues, Decreased initiation          Exercises Other Exercises Other Exercises: Rt forearm prop at EOB with tricep press up to midline. 7 total reps, 2x AAROM and 5x AROM.    General Comments        Pertinent Vitals/Pain Pain Assessment Pain Assessment: Faces Faces Pain Scale: Hurts a little bit Pain Location: generalized Pain Descriptors / Indicators: Grimacing, Discomfort Pain Intervention(s): Limited activity within patient's tolerance, Monitored during session, Repositioned    Home Living                          Prior Function            PT Goals (current goals can now be found in the care plan section) Acute Rehab PT Goals Patient Stated Goal: to get more therapy and gain independence PT Goal Formulation: With patient Time For Goal Achievement: 10/12/23 (extended x2 weeks) Potential to Achieve Goals: Fair Progress towards PT goals: Progressing toward goals    Frequency    Min 1X/week      PT  Plan      Co-evaluation              AM-PAC PT 6 Clicks Mobility   Outcome Measure  Help needed turning from your back to your side while in a flat bed without using bedrails?: A Lot Help needed moving from lying on your back to sitting on the side of a flat bed without using bedrails?: Total Help needed moving to and from a bed to a chair (including a wheelchair)?: Total Help needed standing up from a chair using your arms (e.g., wheelchair or bedside chair)?: Total Help needed to walk in hospital room?: Total Help needed climbing 3-5 steps with a railing? : Total 6 Click Score: 7    End of Session Equipment Utilized During Treatment: Gait belt (Lt UE sling) Activity Tolerance: Patient tolerated treatment well Patient left: in bed;with call bell/phone within reach;with bed alarm set Nurse Communication: Mobility status;Need for lift equipment PT Visit Diagnosis: Other abnormalities of gait and mobility (R26.89);Other symptoms and signs involving the nervous system (R29.898);Hemiplegia and hemiparesis Hemiplegia - Right/Left: Left Hemiplegia - dominant/non-dominant: Non-dominant Hemiplegia - caused by: Cerebral infarction     Time: 8770-8692 PT Time  Calculation (min) (ACUTE ONLY): 38 min  Charges:    $Therapeutic Activity: 23-37 mins PT General Charges $$ ACUTE PT VISIT: 1 Visit                     Vernell DONEEN KLEIN, DPT Acute Rehabilitation Services Office [phone]  09/28/23 1:42 PM

## 2023-09-28 NOTE — Progress Notes (Signed)
 Patient ID: [name], male   DOB: 1980/12/25, 42 y.o.   MRN: [id number] S: Still frustrated about the lack of PT/OT.  Discussed that he had HD yesterday and they should be here today. O:BP 117/86 (BP Location: Right Arm)   Pulse 77   Temp 99.4 F (37.4 C) (Axillary)   Resp 19   Ht 5' 7 (1.702 m)   Wt 87.6 kg   SpO2 93%   BMI 30.25 kg/m   Intake/Output Summary (Last 24 hours) at 09/28/2023 1017 Last data filed at 09/27/2023 1817 Gross per 24 hour  Intake --  Output 3000 ml  Net -3000 ml   Intake/Output: I/O last 3 completed shifts: In: -  Out: 3000 [Other:3000]  Intake/Output this shift:  No intake/output data recorded. Weight change: 0 kg Gen: NAD CVS: RRR Resp:CTA Abd: +BS, soft, NT/nD Ext:2+ edema of LUE, s/p LAKA Dialysis access:  left femoral [facility]   Recent Labs  Lab 09/22/23 0559 09/23/23 0633 09/24/23 0736 09/25/23 0302 09/26/23 0232 09/27/23 0434  NA 134* 136 131* 135 136 134*  K 5.8* 6.0* >7.5* 5.2* 4.7 5.2*  CL 96* 97* 94* 96* 98 94*  CO2 24 26 16* 22 25 23   GLUCOSE 355* 72 285* 235* 266* 290*  BUN 41* 50* 66* 35* 42* 48*  CREATININE 5.44* 6.45* 7.71* 5.14* 6.39* 7.33*  ALBUMIN  2.6* 2.6* 2.6* 2.8* 2.6*  --   CALCIUM  9.0 9.2 9.2 9.3 9.2 8.9  PHOS 8.2* 8.9* 11.3* 6.6* 8.1*  --    Liver Function Tests: Recent Labs  Lab 09/24/23 0736 09/25/23 0302 09/26/23 0232  ALBUMIN  2.6* 2.8* 2.6*   No results for input(s): LIPASE, AMYLASE in the last 168 hours. No results for input(s): AMMONIA in the last 168 hours. CBC: Recent Labs  Lab 09/24/23 1313 09/25/23 0301 09/26/23 0231 09/27/23 0250  WBC 9.8 6.4 6.4 8.7  NEUTROABS  --  5.0 4.3 6.3  HGB 7.9* 7.7* 7.4* 8.9*  HCT 25.7* 25.0* 23.7* 29.2*  MCV 91.8 92.6 92.2 93.6  PLT 181 154 168 153   Cardiac Enzymes: No results for input(s): CKTOTAL, CKMB, CKMBINDEX, TROPONINI in the last 168 hours. CBG: Recent Labs  Lab 09/26/23 1750 09/26/23 2116 09/27/23 1130 09/27/23 2115  09/28/23 0616  GLUCAP 288* 375* 373* 324* 348*    Iron  Studies: No results for input(s): IRON , TIBC, TRANSFERRIN, FERRITIN in the last 72 hours. Studies/Results: No results found.  apixaban   5 mg Oral BID   bacitracin    Topical Daily   calcitRIOL   1.5 mcg Oral Q M,W,F-HD   Chlorhexidine  Gluconate Cloth  6 each Topical Q0600   darbepoetin (ARANESP ) injection - DIALYSIS  100 mcg Subcutaneous Q Sun-1800   gabapentin   100 mg Oral TID   Gerhardt's butt cream   Topical BID   influenza vac split trivalent PF  0.5 mL Intramuscular Tomorrow-1000   insulin  aspart  0-6 Units Subcutaneous TID AC & HS   insulin  glargine-yfgn  2 Units Subcutaneous Daily   loratadine   10 mg Oral Daily   metoprolol  tartrate  25 mg Oral BID   midodrine   10 mg Oral Q M,W,F-HD   pantoprazole   40 mg Oral Daily   povidone-Iodine    Topical Q0600   QUEtiapine   12.5 mg Oral q morning   QUEtiapine   25 mg Oral QHS   senna-docusate  2 tablet Oral BID   sevelamer  carbonate  1,600 mg Oral TID WC   sodium zirconium cyclosilicate   10 g Oral TID  BMET    Component Value Date/Time   NA 134 (L) 09/27/2023 0434   NA 138 08/22/2014 0447   K 5.2 (H) 09/27/2023 0434   K 4.0 08/22/2014 0447   CL 94 (L) 09/27/2023 0434   CL 100 08/22/2014 0447   CO2 23 09/27/2023 0434   CO2 30 08/22/2014 0447   GLUCOSE 290 (H) 09/27/2023 0434   GLUCOSE 88 08/22/2014 0447   BUN 48 (H) 09/27/2023 0434   BUN 14 08/22/2014 0447   CREATININE 7.33 (H) 09/27/2023 0434   CREATININE 5.00 (H) 08/22/2014 0447   CALCIUM  8.9 09/27/2023 0434   CALCIUM  8.2 (L) 08/22/2014 0447   GFRNONAA 9 (L) 09/27/2023 0434   GFRNONAA 14 (L) 08/22/2014 0447   GFRNONAA 5 (L) 06/19/2014 0937   GFRAA 7 (L) 05/30/2020 1923   GFRAA 17 (L) 08/22/2014 0447   GFRAA 6 (L) 06/19/2014 0937   CBC    Component Value Date/Time   WBC 8.7 09/27/2023 0250   RBC 3.12 (L) 09/27/2023 0250   HGB 8.9 (L) 09/27/2023 0250   HGB 9.7 (L) 08/22/2014 0447   HCT 29.2 (L)  09/27/2023 0250   HCT 30.0 (L) 08/22/2014 0447   PLT 153 09/27/2023 0250   PLT 158 08/22/2014 0447   MCV 93.6 09/27/2023 0250   MCV 97 08/22/2014 0447   MCH 28.5 09/27/2023 0250   MCHC 30.5 09/27/2023 0250   RDW 15.3 09/27/2023 0250   RDW 15.3 (H) 08/22/2014 0447   LYMPHSABS 1.1 09/27/2023 0250   LYMPHSABS 1.7 08/22/2014 0447   MONOABS 0.6 09/27/2023 0250   MONOABS 0.8 08/22/2014 0447   EOSABS 0.6 (H) 09/27/2023 0250   EOSABS 0.4 08/22/2014 0447   BASOSABS 0.0 09/27/2023 0250   BASOSABS 0.1 08/22/2014 0447    OP HD: [facility] MWF  4h   400/1.5  77kg  L fem TDC 2/3 bath  Heparin  none   Assessment/ Plan: 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 Not a candidate for surgery here. SP line holiday w/ new TDC placed 10/18. Continues on IV vanc upon return from [place] . Adjusted plan per ID pharm is 750mg  MWF through 10/27/23.  Hyperkalemia - Pt not adhering to renal diet though sign on door with specific instructions for dietary.  Lokelma  has been increased to BID, remains high increase to TID.  Follow daily labs - K mid 4s now, cont TID lokelma .  SP I&D brain abscess: drained 12/04 at [facility]. Mentation improved. Acute bilat CVA: suspected consequence of septic emboli  ESRD: on HD MWF - BP has been on low side, will add pre HD midodrine .  Volume: on RA, L arm + edema, no other edema. UF as tolerated with HD Anemia: Hb 8-9s, on ESA. Hold iron  infusion while on IV ABX. 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 Cont binders w/ meals.  Hypertension: BP on low side recently, on low dose BB w h/o atrial arrhythmia.   HD access:  L  femoral TDC placed on 10/18. 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 Type 1 DM: per primary Dispo - tolerated full HD 12/27 in chair. Cont efforts to dialyze in chair.  Appears SNF being pursued  Fairy RONAL Sellar, MD [employer] [phone]

## 2023-09-28 NOTE — Progress Notes (Signed)
Will need to coordinate out-pt HD schedule with clinic closer to d/c. Pt's previous appt time is no longer available. Will assist as needed.   Olivia Canter Renal Navigator [phone]

## 2023-09-28 NOTE — Discharge Instructions (Addendum)
 [facility]  [place] funds mental health services for residents on medicaid or who are uninsured.   This is at the [facility]  [address], [place], [name] [address] [phone]  They have walk-in outpt appointments on the second floor on weekday mornings (call to verify hours). These are first-come, first-serve, so try to get there early or you might get pushed to the next day.  On the first floor they have a 24/7 psychiatric urgent care for emergences and a unit equipped to take care of mild detox; they can also help link you to rehab facilities.    Information on my medicine - ELIQUIS  (apixaban )  This medication education was reviewed with me or my healthcare representative as part of my discharge preparation.    Why was Eliquis  prescribed for you? Eliquis  was prescribed to treat blood clots that may have been found in the veins of your legs (deep vein thrombosis) or in your lungs (pulmonary embolism) and to reduce the risk of them occurring again.  What do You need to know about Eliquis  ? Continue Eliquist 5 mg tablet taken TWICE daily.  Eliquis  may be taken with or without food.   Try to take the dose about the same time in the morning and in the evening. If you have difficulty swallowing the tablet whole please discuss with your pharmacist how to take the medication safely.  Take Eliquis  exactly as prescribed and DO NOT stop taking Eliquis  without talking to the doctor who prescribed the medication.  Stopping may increase your risk of developing a new blood clot.  Refill your prescription before you run out.  After discharge, you should have regular check-up appointments with your healthcare provider that is prescribing your Eliquis .    What do you do if you miss a dose? If a dose of ELIQUIS  is not taken at the scheduled time, take it as soon as possible on the same day and twice-daily administration should be resumed. The dose should  not be doubled to make up for a missed dose.  Important Safety Information A possible side effect of Eliquis  is bleeding. You should call your healthcare provider right away if you experience any of the following: Bleeding from an injury or your nose that does not stop. Unusual colored urine (red or dark brown) or unusual colored stools (red or black). Unusual bruising for unknown reasons. A serious fall or if you hit your head (even if there is no bleeding).  Some medicines may interact with Eliquis  and might increase your risk of bleeding or clotting while on Eliquis . To help avoid this, consult your healthcare provider or pharmacist prior to using any new prescription or non-prescription medications, including herbals, vitamins, non-steroidal anti-inflammatory drugs (NSAIDs) and supplements.  This website has more information on Eliquis  (apixaban ): http://[email or web address] .com/eliquis dena

## 2023-09-28 NOTE — TOC Progression Note (Addendum)
 Transition of Care [facility]) - Progression Note    Patient Details  Name: [name] MRN: [id number] Date of Birth: 07/20/1981  Transition of Care [facility]) CM/SW Contact  [name], [place] Phone Number: 09/28/2023, 3:13 PM  Clinical Narrative:   Per Psychiatry, pt has capacity at this time. CSW spoke with pt and explained self/role. Pt inquired about a motorized wc, CSW notified NCM. CSW presented accepting bed offers with Medicare.gov ratings and placed hard copy on pts chart. Pt wants to discuss SNF options with his mother.  Pt asked CSW about sending referral to [facility]. CSW explained referral was sent yesterday 12/31 with no follow up at this time.   3:17PM: CSW heard from Angie at [facility] and they do not have bed availability at this time.  TOC will continue to follow.          Expected Discharge Plan and Services                                               Social Determinants of Health (SDOH) Interventions SDOH Screenings   Food Insecurity: No Food Insecurity (09/09/2023)  Housing: Unknown (09/09/2023)  Transportation Needs: No Transportation Needs (09/09/2023)  Utilities: Not At Risk (09/09/2023)  Alcohol Screen: Low Risk  (02/15/2023)  Depression (PHQ2-9): Low Risk  (02/15/2023)  Financial Resource Strain: Low Risk  (02/15/2023)  Physical Activity: Inactive (02/15/2023)  Social Connections: Socially Integrated (02/15/2023)  Stress: No Stress Concern Present (02/15/2023)  Tobacco Use: Low Risk  (09/12/2023)  Recent Concern: Tobacco Use - Medium Risk (09/01/2023)   Received from [facility]    Readmission Risk Interventions     No data to display

## 2023-09-28 NOTE — Consult Note (Signed)
 [facility] Psychiatric Consult Initial  Patient Name: .[name]  MRN: [id number]  DOB: [birth year]  Dr. CHRISTOBAL, Patient has been cursing at staff,?  If able to make medical decision Consult Order details:   Mode of Visit: In person    Psychiatry Consult Evaluation  Service Date: September 28, 2023 LOS:  LOS: 19 days  Chief Complaint I want to get out of here  Primary Psychiatric Diagnoses  Demoralization Syndrome  Assessment  [name] is a 42 y.o. male admitted: Medicallyfor 09/09/2023  5:17 PM for backtransfer from tertiary care facility. He carries the psychiatric diagnoses of no formal dx and has a past medical history of  DMI and sequelae (osteomyelitis, diabetic foot infection, MRSA bacteremia, multivalve endocarditis, intracraneal abscess). 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 He feels his sx are adequately controlled on current regimen and thinks he will have further improvement once he gets out of the hospital - feels he has harmed relationship with some of his nursing staff by his behavior. He is aware of many of his deficits and has some clear issues with executive functioning; nonetheless I feel he has decisional capacity to make decisions about discharge and SNF placement (see below). Notably this pt clearly has globally deminished executive functioning, and would likely lack capacity for some but not all complex decisions - asked that his mother be involved in any decisions he cannot make for himself and does not want his adult children contacted. He has some interet in therapy after discharge and resources put in chart.   Diagnoses:  Active Hospital problems: Principal Problem:   Endocarditis Active Problems:    Insulin  dependent type 1 diabetes mellitus ([name])   Diabetic gastroparesis ([name])   ESRD on dialysis ([name])   Diabetic foot infection ([name])   MRSA bacteremia   Endocarditis of mitral valve   Endocarditis of tricuspid valve   Cerebrovascular accident (CVA) due to embolism of cerebral artery ([name])   Cognitive impairment    Plan   ## Psychiatric Medication Recommendations:  -- no changes  ## Medical Decision Making Capacity:  In an evaluation of capacity, each of the following criteria must be met based on medical necessity in order for a patient to have capacity to make the decision in question. Of note, the capacity evaluation assesses only for the specified decision documented above and is not a determination of the patient's overall competency, which can only be adjudicated.   Criterion 1: The patient demonstrates a clear and consistent voluntary choice with regard to treatment options (yes - wants to go home but understands he needs to go to a SNF. Working hard with PT to minimize time at rehab)  Criterion 2: The patient adequately understands the disease they have, the treatment proposed, the risks of treatment, and the risks of other treatment (including no treatment). (Yes - understands has had strokes and needs rehab and a motorized wheelchair)  Criterion 3: The patient acknowledges that the details of Criterion 2 apply to them specifically and the likely consequences of treatment options proposed. (Yes   Criterion 4: The patient demonstrates adequate reasoning/rationality within the context of their decision and can provide justification for their choice. Yes - clearly understands that there are tiers of facilities and is attempting to advocate  for best placement. When confronted with information against his narrative (ie facility his cousin works at [employer] him) he readily accepts a second option.   In this case, the patient does have capacity to make dispositional  decisions. See patient interview below for details.   ## Further Work-up:  -- none currently   -- most recent EKG on 11/2 had QtC of 445   ## Disposition:-- There are no psychiatric contraindications to discharge at this time  ## Behavioral / Environmental: -Utilize compassion and acknowledge the patient's experiences while setting clear and realistic expectations for care.    ## Safety and Observation Level:  - Based on my clinical evaluation, I estimate the patient to be at low risk of self harm in the current setting. - At this time, we recommend  routine. This decision is based on my review of the chart including patient's history and current presentation, interview of the patient, mental status examination, and consideration of suicide risk including evaluating suicidal ideation, plan, intent, suicidal or self-harm behaviors, risk factors, and protective factors. This judgment is based on our ability to directly address suicide risk, implement suicide prevention strategies, and develop a safety plan while the patient is in the clinical setting. Please contact our team if there is a concern that risk level has changed.  CSSR Risk Category:C-SSRS RISK CATEGORY: No Risk  Suicide Risk Assessment: Patient has following modifiable risk factors for suicide: lack of access to outpatient mental health resources and prolonged hospitalization, which we are addressing by putting in resources for outpt therapy; pt working hard to change circumstances. . Patient has following non-modifiable or demographic risk factors for suicide: male gender Patient has the following protective factors against suicide: Supportive family, Cultural, spiritual, or religious beliefs that discourage suicide, no history of suicide attempts, and no history of NSSIB  Thank you for this consult request. Recommendations have been communicated to the primary team.  We will sign off at this time.   Orion Mole A Eswin Worrell        History of Present Illness  Relevant Aspects of Hospital Hospital Course:  This pt has had a prolonged hospitalization - has functionally been admitted since 07/09/2023. 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  Transferred back to [facility] on 12/12. Now recommended for SNF, although pt has previously expressed desire to go home with his mother. Capacity has been qeustioned since at least 12/17 (when under care of Dr. Patsy) although psychiatry not formally consulted until 12/30. Per notes from around that time, pt was felt to have borderline capacity (mentation fluctuating and pt able to display some reasoning when discussing dispo options). He was seen by SLP on 12/20 and was found to have impaired problem solving and executive functioning missing several points on COGNISTAT (most severe deficits in memory domain).    Patient Report:  Patient seen in room; discussing SNF options with his mother on the phone. Talked briefly to mom during interview. Pt oriented x3 - knows it is the last day of the year but not the date, where he is, and grossly why he is here.   Endorses low mood related to hospitalization as below; full range of affect noted (beams when talking about daughter's new job). Feels he is not  a pill person and takes pride in being clean - no desire for medication changes. Reflects globally on the impact diabetes has had on his life and how he thinks his life would have gone differently if he did not develop DMI at 12.   He maintains that he does not want to go to a SNF and that he is doing what he can to minimize the amount of time he spends in a nursing facility. Takes pride in participating in PT even when he doesn't feel  up to it - indeed several recent conflicts with staff have been around him requesting increased frequency of PT services and being upset when dialysis causes him to miss sessions. Talks about some factors that make him want to accept vs reject a SNF - has heard bad stories about some facilities and wants to make sure he goes somewhere he will be treated well. His first choice was a place his cousin worked at; however when informed by his mother this facility cannot care for someone with his medical needs he immediately moved to a second choice (it sounded like she had previously told him and he had forgotten this). Has some difficulty discussing specific details of his medical condition but has largely been compliant with care. He is aware that he struggles making many decisions and welcomes his mother's involvement in his care - does not want his adult children to be put into that role. Some clear executive functioning issues - frequently uses crude language and immediately apologizes (felt non-volitional)   Psych ROS:  Depression: Low mood, irritability, difficulty sleeping (responding well to quetiapine ). Low energy, difficulty with concentration (these are best explained by overall medical condition). No real change in appetite or SI Anxiety:  largely denied Mania (lifetime and current): largely denied Psychosis: (lifetime and current): largely denied  Collateral information:  Spoke briefly to mom on speaker phone - contributed to pt interview and some hx below   Psychiatric and Social History  Psychiatric History:  Information collected from pt, mother  Prev Dx/Sx: noen Current Psych Provider: n/a Home Meds (current): none currently  Previous Med Trials: n/a Therapy: n/a  Prior Psych Hospitalization: n/a  Prior Self Harm: n/a Prior Violence: n/a  Family Psych History: Cousin completed suicide. Uncle with schizophrenia Family Hx suicide: as above.   Social History:  Developmental  Hx: deferred Educational Hx: deferred Occupational Hx: hustling  Legal Hx: has been to jail 3x for hustling (nonviolent) Living Situation: mostly with mom Spiritual Hx: yes my whole family is religious Access to weapons/lethal means: no, not at [facility]   Substance History Notably has functionally been inpt for 2-3 months no real concern for w/d Alcohol: 1-2 drinks/year  Type of alcohol did not assess Last Drink did not assess Number of drinks per day <1 History of alcohol withdrawal seizures did not assess History of DT's did not assess Tobacco: no Illicit drugs: remote Prescription drug abuse: no Rehab hx: did nto assess  Exam Findings  Physical Exam:  Vital Signs:  Temp:  [97.6 F (36.4 C)-100 F (37.8 C)] 99.4 F (37.4 C) (12/31 0413) Pulse Rate:  [77-130] 77 (12/31 0413) Resp:  [13-22] 19 (12/31 0413) BP: (97-118)/(45-92) 117/86 (12/31 0413) SpO2:  [92 %-100 %] 93 % (12/31 0413) Weight:  [87.6 kg-90.2 kg] 87.6 kg (12/31 0505) Blood pressure 117/86, pulse 77, temperature 99.4 F (37.4 C), temperature source Axillary, resp. rate 19, height 5' 7 (1.702 m), weight 87.6 kg, SpO2 93%. Body mass  index is 30.25 kg/m.  Physical Exam Pulmonary:     Effort: Pulmonary effort is normal.  Neurological:     Mental Status: He is alert and oriented to person, place, and time.     Mental Status Exam: General Appearance:  Much older than stated age. Bandage on head. Multiple tattoos.   Orientation:  Other:  x3  Memory:  Immediate;   Fair Recent;   Poor Remote;   Good  Concentration:  Concentration: Fair  Recall:  Fair  Attention  Fair  Eye Contact:  Good  Speech:   frequently uses crude language  Language:  Poor  Volume:  Normal  Mood: I hate being stuck here  Affect:  Appropriate, Congruent, and Full Range  Thought Process:  Goal Directed.   Thought Content:   devoid of delusions/paranoia  Suicidal Thoughts:  No  Homicidal Thoughts:  No  Judgement:  Fair   Insight:  Fair  Psychomotor Activity:  Normal  Akathisia:  No  Fund of Knowledge:  Fair      Assets:  Desire for Improvement Housing Resilience Social Support  Cognition:  Impaired,  Moderate  ADL's:  Impaired  AIMS (if indicated):        Other History   These have been pulled in through the EMR, reviewed, and updated if appropriate.  Family History:  The patient's family history includes Diabetes in his father; Diabetes Mellitus II in an other family member; Renal Disease in his father.  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 Filetted toe flap metatarsal resection. Debridement Plantar Foot wound;  Surgeon: Gretel Ozell PARAS, DPM;  Location: [facility] OR;  Service: Podiatry;  Laterality: Right;   AMPUTATION Left 08/20/2018   Procedure: FIFTH METATARSAL BONE BIOPSY;  Surgeon: Gretel Ozell PARAS, DPM;  Location: [facility] OR;  Service: Podiatry;  Laterality: Left;   AMPUTATION Left 10/28/2018   Procedure: LEFT GREAT TOE AMPUTATION;  Surgeon: Gretel Ozell PARAS, DPM;  Location: [facility] OR;  Service: Podiatry;  Laterality: Left;   AMPUTATION Left 09/16/2020   Procedure: AMPUTATION BELOW KNEE;  Surgeon: Oris Krystal FALCON, MD;  Location[facility] OR;  Service: Vascular;  Laterality: Left;   AMPUTATION Left 10/15/2020   Procedure: AMPUTATION ABOVE KNEE;  Surgeon: Harvey Carlin BRAVO, MD;  Location: [facility] OR;  Service: Vascular;  Laterality: Left;   APPLICATION OF WOUND VAC  02/02/2018    Procedure: APPLICATION OF WOUND VAC  Right Foot;  Surgeon: Gretel Ozell PARAS, DPM;  Location: [facility] OR;  Service: Podiatry;;   APPLICATION OF WOUND VAC Left 10/28/2018   Procedure: APPLICATION OF WOUND VAC LEFT TOE;  Surgeon: Gretel Ozell PARAS, DPM;  Location: [facility] OR;  Service: Podiatry;  Laterality: Left;   APPLICATION OF WOUND VAC Left 11/01/2018   Procedure: APPLICATION OF WOUND VAC;  Surgeon: Gretel Ozell PARAS, DPM;  Location: [facility] OR;  Service: Podiatry;  Laterality: Left;   AV FISTULA PLACEMENT  left arm.   AV FISTULA PLACEMENT Right 12/22/2016   Procedure: INSERTION OF ARTERIOVENOUS (AV) GORE-TEX GRAFT ARM;  Surgeon: Carlin FORBES Haddock, MD;  Location: [facility] OR;  Service: Vascular;  Laterality: Right;   AV FISTULA PLACEMENT Left 05/26/2018   Procedure: INSERTION OF  ARTERIOVENOUS (AV) GORE-TEX GRAFT LEFT ARM;  Surgeon: Serene Gaile ORN, MD;  Location: [facility] OR;  Service: Vascular;  Laterality: Left;   EYE SURGERY     I & D EXTREMITY Right 10/31/2017   Procedure: IRRIGATION AND DEBRIDEMENT RIGHT FOOT;  Surgeon: Gretel Ozell PARAS, DPM;  Location: [facility] OR;  Service: Podiatry;  Laterality: Right;   I & D EXTREMITY Left 08/20/2018   Procedure: IRRIGATION AND DEBRIDEMENT EXTREMITY WITH SECONDARY WOUND CLOSUREAND APPLICATION OF WOUND VAC LEFT FOOT;  Surgeon: Gretel Ozell PARAS, DPM;  Location: [facility] OR;  Service: Podiatry;  Laterality: Left;   I & D EXTREMITY Left 10/20/2018   Procedure: IRRIGATION AND DEBRIDEMENT LEFT FOOT  DEBRIDEMENT LATERAL FOOT WOUND;  Surgeon: Gretel Ozell PARAS, DPM;  Location: [facility] OR;  Service: Podiatry;  Laterality: Left;   I & D EXTREMITY Left 10/28/2018   Procedure: IRRIGATION AND DEBRIDEMENT LEFT TOE;  Surgeon: Gretel Ozell PARAS, DPM;  Location: [facility] OR;  Service: Podiatry;  Laterality: Left;   I & D EXTREMITY Left 09/14/2020   Procedure: IRRIGATION AND DEBRIDEMENT WRIST;  Surgeon: Murrell Drivers, MD;  Location: [facility] OR;  Service: Orthopedics;  Laterality: Left;   INSERTION OF DIALYSIS CATHETER     Right  subclavian   IR AV DIALY SHUNT INTRO NEEDLE/INTRACATH INITIAL W/PTA/IMG RIGHT Right 02/05/2018   IR FLUORO GUIDE CV LINE LEFT  04/14/2023   IR FLUORO GUIDE CV LINE LEFT  07/16/2023   IR FLUORO GUIDE CV LINE RIGHT  01/31/2020   IR FLUORO GUIDE CV LINE RIGHT  01/30/2022   IR PTA VENOUS EXCEPT DIALYSIS CIRCUIT  01/30/2022   IR RADIOLOGY PERIPHERAL GUIDED IV START  09/24/2023   IR REMOVAL TUN CV CATH W/O FL  04/12/2023   IR THROMBECTOMY AV FISTULA W/THROMBOLYSIS/PTA INC/SHUNT/IMG LEFT Left 08/24/2018   IR THROMBECTOMY AV FISTULA W/THROMBOLYSIS/PTA INC/SHUNT/IMG LEFT Left 01/06/2019   IR US  GUIDE VASC ACCESS LEFT  08/24/2018   IR US  GUIDE VASC ACCESS LEFT  04/14/2023   IR US  GUIDE VASC ACCESS LEFT  07/16/2023   IR US  GUIDE VASC ACCESS LEFT  07/16/2023   IR US  GUIDE VASC ACCESS RIGHT  02/05/2018   IR US  GUIDE VASC ACCESS RIGHT  01/31/2020   IR US  GUIDE VASC ACCESS RIGHT  07/16/2023   IR US  GUIDE VASC ACCESS RIGHT  09/24/2023   IR VENO/JUGULAR LEFT  07/16/2023   IR VENO/JUGULAR RIGHT  07/16/2023   IR VENOCAVAGRAM SVC  01/30/2022   IRRIGATION AND DEBRIDEMENT FOOT Right 10/23/2018   Procedure: Irrigation and Debridement to tendon, Left Foot;  Surgeon: Gretel Ozell PARAS, DPM;  Location: [facility] OR;  Service: Podiatry;  Laterality: Right;   IRRIGATION AND DEBRIDEMENT FOOT Left 11/01/2018   Procedure: IRRIGATION AND DEBRIDEMENT PARTIAL WOUND CLOSURE LOCAL TISSUE TRANSFER AND FLAP ROTATION, LEFT FOOT;  Surgeon: Gretel Ozell PARAS, DPM;  Location: [facility] OR;  Service: Podiatry;  Laterality: Left;   TRANSESOPHAGEAL ECHOCARDIOGRAM (CATH LAB) N/A 07/14/2023   Procedure: TRANSESOPHAGEAL ECHOCARDIOGRAM;  Surgeon: Sheena Pugh, DO;  Location: [facility] INVASIVE CV LAB;  Service: Cardiovascular;  Laterality: N/A;   TRANSMETATARSAL AMPUTATION N/A 08/18/2018   Procedure: IRRIGATION AND DEBRIDEMENT OF LEFT 5TH TOE AND TRANSMETATARSAL, WITH PARTICAL LEFT 5TH TOE AND METATARSAL AMPUTATION, BONE  BIOPSY, WOUND VAC APPLICATION.;  Surgeon: Gretel Ozell PARAS, DPM;  Location: [facility] OR;  Service: Podiatry;  Laterality: N/A;   UPPER EXTREMITY VENOGRAPHY N/A 11/16/2016   Procedure: Upper Extremity Venography - Right Central;  Surgeon: Carlin FORBES Haddock, MD;  Location: [facility] INVASIVE CV LAB;  Service: Cardiovascular;  Laterality: N/A;   UPPER EXTREMITY VENOGRAPHY N/A 05/25/2018   Procedure: UPPER EXTREMITY VENOGRAPHY - Bilateral;  Surgeon: Gretta Lonni PARAS, MD;  Location: [facility] INVASIVE CV LAB;  Service: Cardiovascular;  Laterality: N/A;     Medications:   Current Facility-Administered Medications:    acetaminophen  (TYLENOL ) tablet 650 mg, 650 mg, Oral, Q4H PRN, Akula, Vijaya, MD, 650 mg at 09/27/23 0309   albumin  human 25 % solution 25 g, 25 g, Intravenous, BID PRN, Kruska, Lindsay A, MD, Last Rate: 60 mL/hr at 09/24/23 1456, 25 g at 09/24/23 1456   apixaban  (ELIQUIS ) tablet 5 mg, 5 mg, Oral, BID, Akula, Vijaya, MD, 5 mg at 09/27/23 2057   bacitracin  ointment, , Topical, Daily, Girguis, David, MD, Given at 09/27/23 9041   calcitRIOL  (ROCALTROL ) capsule 1.5 mcg, 1.5 mcg, Oral, Q M,W,F-HD, Akula, Vijaya, MD, 1.5 mcg at 09/24/23 1332   Chlorhexidine  Gluconate Cloth 2 % PADS 6 each, 6 each, Topical, Q0600, Geralynn Charleston, MD, 6 each at 09/26/23 9370   clonazepam  (KLONOPIN ) disintegrating tablet 0.25 mg, 0.25 mg, Oral, BID PRN, Girguis, David, MD, 0.25 mg at 09/27/23 2117   Darbepoetin Alfa  (ARANESP ) injection 100 mcg, 100 mcg, Subcutaneous, Q Sun-1800, Geralynn Charleston, MD, 100 mcg at 09/26/23 1900   diphenhydrAMINE  (BENADRYL ) 12.5 MG/5ML elixir 12.5 mg, 12.5 mg, Oral, Q8H PRN, Akula, Vijaya, MD, 12.5 mg at 09/24/23 1336   diphenhydrAMINE -zinc  acetate (BENADRYL ) 2-0.1 % cream, , Topical, BID PRN, Akula, Vijaya, MD   gabapentin  (NEURONTIN ) capsule 100 mg, 100 mg, Oral, TID, Akula, Vijaya, MD, 100 mg at 09/27/23 2055   Gerhardt's butt cream, , Topical, BID, Swayze, Ava, DO, Given at 09/26/23 9177   Gerhardt's butt cream, , Topical, PRN, Reome, Earle J, RPH    heparin  sodium (porcine) injection 4,600 Units, 4,600 Units, Intracatheter, Continuous PRN, Geralynn Charleston, MD, 4,600 Units at 09/27/23 1811   hydrALAZINE  (APRESOLINE ) injection 10 mg, 10 mg, Intravenous, Q6H PRN, Akula, Vijaya, MD   influenza vac split trivalent PF (FLULAVAL) injection 0.5 mL, 0.5 mL, Intramuscular, Tomorrow-1000, Arrien, Elidia Sieving, MD   insulin  aspart (novoLOG ) injection 0-6 Units, 0-6 Units, Subcutaneous, TID AC & HS, Girguis, David, MD, 4 Units at 09/28/23 9374   insulin  glargine-yfgn (SEMGLEE ) injection 2 Units, 2 Units, Subcutaneous, Daily, Kc, Ramesh, MD   loperamide  (IMODIUM ) capsule 2 mg, 2 mg, Oral, Q4H PRN, Girguis, David, MD, 2 mg at 09/19/23 1629   loratadine  (CLARITIN ) tablet 10 mg, 10 mg, Oral, Daily, Chavez, Abigail, NP, 10 mg at 09/27/23 0948   metoprolol  tartrate (LOPRESSOR ) injection 5 mg, 5 mg, Intravenous, Q4H PRN, Laveda, Debby, MD, 5 mg at 09/25/23 9470   metoprolol  tartrate (LOPRESSOR ) tablet 25 mg, 25 mg, Oral, BID, Crosley, Debby, MD, 25 mg at 09/27/23 2057   midodrine  (PROAMATINE ) tablet 10 mg, 10 mg, Oral, Q M,W,F-HD, Norine Manuelita LABOR, MD, 10 mg at 09/27/23 1146   ondansetron  (ZOFRAN ) injection 4 mg, 4 mg, Intravenous, Q6H PRN, Swayze, Ava, DO, 4 mg at 09/24/23 9144   oxyCODONE  (Oxy IR/ROXICODONE ) immediate release tablet 5 mg, 5 mg, Oral, Q6H PRN, Girguis, David, MD, 5 mg at 09/28/23 0606   pantoprazole  (PROTONIX ) EC tablet 40 mg, 40 mg, Oral, Daily, Akula,  Vijaya, MD, 40 mg at 09/27/23 0948   povidone-Iodine  (BETADINE ) 5 % topical solution, , Topical, Q0600, Patsy Lenis, MD, Given at 09/28/23 0607   QUEtiapine  (SEROQUEL ) tablet 12.5 mg, 12.5 mg, Oral, q morning, Akula, Vijaya, MD, 12.5 mg at 09/27/23 9051   QUEtiapine  (SEROQUEL ) tablet 25 mg, 25 mg, Oral, QHS, Akula, Vijaya, MD, 25 mg at 09/27/23 2056   senna-docusate (Senokot-S) tablet 2 tablet, 2 tablet, Oral, BID, Akula, Vijaya, MD, 2 tablet at 09/27/23 9051   sevelamer  carbonate  (RENVELA ) tablet 1,600 mg, 1,600 mg, Oral, TID WC, Kruska, Lindsay A, MD, 1,600 mg at 09/28/23 9184   sodium zirconium cyclosilicate  (LOKELMA ) packet 10 g, 10 g, Oral, TID, Kruska, Lindsay A, MD, 10 g at 09/27/23 2054   vancomycin  (VANCOREADY) IVPB 500 mg/100 mL, 500 mg, Intravenous, Q M,W,F-HD, Reome, Earle J, RPH, Last Rate: 100 mL/hr at 09/27/23 1657, 500 mg at 09/27/23 1657  Allergies: No Known Allergies  Rollene A Bruna Dills

## 2023-09-28 NOTE — Progress Notes (Signed)
 PROGRESS NOTE HUNT ZAJICEK  FMW:[id number] DOB: 1981/09/03 DOA: 09/09/2023 PCP: Celestia Rosaline SQUIBB, NP  Brief Narrative/Hospital Course: [name] is a 42 y.o. 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  While at [facility] he underwent cardiac cath showing non obstructive disease, craniotomy with abscess drainage on 09/01/23,. He had subarachnoid hemorrhage while on IV heparin , right third toe distal necrosis,was seen by podiatry, but family wanted conservative management.  Since his tertiary care needs have been completed, he was transferred back to [facility] for further management.  PTOT  recommending SNF and awaiting placement.    Subjective: Patient seen and examined Complains of some constipation but no new complaint appears calm Asking to go up on his pain medication but after discussion he is ok to stay on current regimen Overnight afebrile, heart rate has been variable up to 121, BP stable on room air seen Blood sugar remains elevated   Assessment and Plan: Principal Problem:   Endocarditis Active Problems:   MRSA bacteremia   Endocarditis of tricuspid valve   ESRD on dialysis [facility])   Cognitive impairment   Insulin  dependent type 1 diabetes mellitus ([name])   Diabetic gastroparesis ([name])   Diabetic foot infection ([name])   Endocarditis of mitral valve   Cerebrovascular accident (CVA) due to embolism of cerebral artery ([name])   MRSA TV/MV endocarditis 2/2 HD cath infection Prostate abscess-prostate fluid collection CNS emboli/abscess s/p craniotomy 09/01/23 at [facility]: Positive blood  culture 07/10/23-repeat blood cultures Negative 08/25/23. S/p TDC placed 10/18 lt groin. 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 Plan is to finish 8 weeks of vancomycin  3x weekly w/ HD EOT 10/26/22. Will need outpatient urology follow-up for prostatic abscess/[facility]?. ID clinic follow-up arranged for 1/14 @ 9.45 am Dr Dennise- ID signed off 12/16 Remains afebrile with normal WC count.  ESRD on HD Hyperkalemia Anemia of CKD CKD MBD: Continue HD per nephrology MWF- last HD 12/30. BP has been on the lowER side, added pre-HD midodrine  12/30 continue ASA, iron  infusion held due to IV antibiotics .Monitor calcium  and Phos and continue binders with meals. Hemoglobin remains stable. K and cbg elevated due to noncompliance Recent Labs    09/19/23 1035 09/19/23 2030 09/20/23 1258 09/21/23 0620 09/22/23 0559 09/23/23 9366 09/24/23 0736 09/25/23 0302 09/26/23 0232 09/27/23 0434  BUN 53* 29* 37* 47* 41* 50* 66* 35* 42* 48*  CREATININE 5.87* 4.02* 4.89* 5.80* 5.44* 6.45* 7.71* 5.14* 6.39* 7.33*  CO2 21* 25 27 25 24 26  16* 22 25 23   K 6.2* 4.9 5.3* 6.3* 5.8* 6.0* >7.5* 5.2* 4.7 5.2*    Acute bilateral CVA: Suspected to be due to septic emboli.  Hypertension: BP on lower side, on low-dose beta-blocker due to atrial arrhythmia, cont Pre HD midodrine    Left upper extremity swelling Acute DVT of left intrajugular vein left subclavian and left axillary vein on 08/01/26: Elevate left upper arm w/ sling or pillows Continue Eliquis   Right third toe necrosis: Seen by podiatry at [facility] recommended surgical intervention but family opted for conservative measurement  on antibiotics, wound care  SAH while on heparin : Stable now tolerating Eliquis .  Type 1 diabetes mellitus with uncontrolled hyperglycemia, poor compliance Gastroparesis: Patient was very agitated when diet was modified so he is on regular  diet, blood sugar potassium remains uncontrolled.  PT and SSI 0 to 4 units 3 times daily, Lantus  4 units daily.continue SSI 0-6 units, add Lantus  2 units daily, he is sensitive to insulin . Continue symptomatic management for gastroparesis Recent Labs  Lab 09/26/23 1750 09/26/23 2116 09/27/23 1130 09/27/23 2115 09/28/23 0616  GLUCAP 288* 375* 373* 324* 348*   Deconditioning/debility Left BKA status 09/2020: Patient with multiple complex comorbidities with deconditioning debility.  Continue PT OT as able.  Recommending skilled nursing facility upon discharge.  Patient has been previously seen by palliative care remains full code Is at high risk for readmission decompensation due to his noncompliance complex medical comorbidities, overall poor baseline functional status and ongoing decline.  Previously he mentioned he is willing to go to SNF if he can come and go as he wants.  Having waxing and waning mental status.  Mother is frequently involved in his care either at bedside or on the phone while rounding.  Psych issues ?  Cognitive impairment: Patient seems to be noncompliant, has been cussing at staffs and verbally aggressive at times, spit in a RN eye earlier in the admission. Suspect has underlying baseline psych issues.Psychiatry has been consulted 12/30 and will see him today. Cont hom Seroquel  12.5 mg am 25mg  bedtime.appears he was on Klonopin  0.25 prn anxiety PTA but on hold  Obesity: Patient's Body mass index is 30.25 kg/m. : Will benefit with PCP follow-up, weight loss  healthy lifestyle and outpatient sleep evaluation.  Consultation involved: Nephrology Neurosurgery Neurology Palliative care IR Cardiology/EP Cardiothoracic surgery Infectious disease Psychiatry 12/30  DVT prophylaxis:  Code Status:   Code Status: Full Code Family Communication: plan of care discussed with patient/mother updated on phone 12/30 Patient status is: Remains hospitalized because of severity of  illness Level of care: Progressive   Dispo: The patient is from: Home, lives with his mother.            Anticipated disposition: TBD-awaiting for skilled nursing facility Objective: Vitals last 24 hrs: Vitals:   09/27/23 2113 09/28/23 0113 09/28/23 0413 09/28/23 0505  BP: 108/69 110/64 117/86   Pulse: (!) 130 (!) 118 77   Resp: 18 17 19    Temp: 97.6 F (36.4 C) 100 F (37.8 C) 99.4 F (37.4 C)   TempSrc: Oral Axillary Axillary   SpO2: 100% 100% 93%   Weight:    87.6 kg  Height:       Weight change: 0 kg  Physical Examination: General exam: alert awake,obese. HEENT:Oral mucosa moist, Ear/Nose WNL grossly Respiratory system: Bilaterally clear BS,no use of accessory muscle Cardiovascular system: S1 & S2 +, No JVD. Gastrointestinal system: Abdomen soft,NT,ND, BS+ Nervous System: Alert, awake, moving all extremities LUE swollen and old Lt BKA Extremities: LE edema neg,distal peripheral pulses palpable and warm.  Skin: No rashes,no icterus. MSK: Normal muscle bulk,tone, power  Left groin with HD cath, w/ C/D/I dressing  Medications reviewed:  Scheduled Meds:  apixaban   5 mg Oral BID   bacitracin    Topical Daily   calcitRIOL   1.5 mcg Oral Q M,W,F-HD   Chlorhexidine  Gluconate Cloth  6 each Topical Q0600   darbepoetin (ARANESP ) injection - DIALYSIS  100 mcg Subcutaneous Q Sun-1800   gabapentin   100 mg Oral TID   Gerhardt's butt  cream   Topical BID   influenza vac split trivalent PF  0.5 mL Intramuscular Tomorrow-1000   insulin  aspart  0-6 Units Subcutaneous TID AC & HS   insulin  glargine-yfgn  2 Units Subcutaneous Daily   loratadine   10 mg Oral Daily   metoprolol  tartrate  25 mg Oral BID   midodrine   10 mg Oral Q M,W,F-HD   pantoprazole   40 mg Oral Daily   povidone-Iodine    Topical Q0600   QUEtiapine   12.5 mg Oral q morning   QUEtiapine   25 mg Oral QHS   senna-docusate  2 tablet Oral BID   sevelamer  carbonate  1,600 mg Oral TID WC   sodium zirconium cyclosilicate   10 g  Oral TID   Continuous Infusions:  albumin  human 25 g (09/24/23 1456)   heparin  sodium (porcine)     vancomycin  500 mg (09/27/23 1657)   Diet Order             Diet regular Room service appropriate? Yes; Fluid consistency: Thin; Fluid restriction: 1500 mL Fluid  Diet effective now                  Intake/Output Summary (Last 24 hours) at 09/28/2023 1113 Last data filed at 09/27/2023 1817 Gross per 24 hour  Intake --  Output 3000 ml  Net -3000 ml   Net IO Since Admission: -6,365.04 mL [09/28/23 1113]  Wt Readings from Last 3 Encounters:  09/28/23 87.6 kg  08/24/23 78 kg  04/14/23 (S) 77.6 kg     Unresulted Labs (From admission, onward)     Start     Ordered   Signed and Held  Renal function panel  Once,   R       Question:  Specimen collection method  Answer:  Lab=Lab collect   Signed and Held   Signed and Held  Renal function panel  Once,   R       Question:  Specimen collection method  Answer:  Lab=Lab collect   Signed and Held   Signed and Held  CBC  Once,   R       Question:  Specimen collection method  Answer:  Lab=Lab collect   Signed and Held          Data Reviewed: I have personally reviewed following labs and imaging studies CBC: Recent Labs  Lab 09/24/23 1313 09/25/23 0301 09/26/23 0231 09/27/23 0250  WBC 9.8 6.4 6.4 8.7  NEUTROABS  --  5.0 4.3 6.3  HGB 7.9* 7.7* 7.4* 8.9*  HCT 25.7* 25.0* 23.7* 29.2*  MCV 91.8 92.6 92.2 93.6  PLT 181 154 168 153   Basic Metabolic Panel:  Recent Labs  Lab 09/22/23 0559 09/23/23 0633 09/24/23 0736 09/25/23 0302 09/26/23 0232 09/27/23 0434  NA 134* 136 131* 135 136 134*  K 5.8* 6.0* >7.5* 5.2* 4.7 5.2*  CL 96* 97* 94* 96* 98 94*  CO2 24 26 16* 22 25 23   GLUCOSE 355* 72 285* 235* 266* 290*  BUN 41* 50* 66* 35* 42* 48*  CREATININE 5.44* 6.45* 7.71* 5.14* 6.39* 7.33*  CALCIUM  9.0 9.2 9.2 9.3 9.2 8.9  MG 1.8 2.0 2.0 1.8 1.9  --   PHOS 8.2* 8.9* 11.3* 6.6* 8.1*  --    GFR: Estimated Creatinine  Clearance: 13.6 mL/min (A) (by C-G formula based on SCr of 7.33 mg/dL (H)). Liver Function Tests:  Recent Labs  Lab 09/22/23 0559 09/23/23 9366 09/24/23 0736 09/25/23 0302 09/26/23 0232  ALBUMIN  2.6*  2.6* 2.6* 2.8* 2.6*   Recent Labs  Lab 09/26/23 1750 09/26/23 2116 09/27/23 1130 09/27/23 2115 09/28/23 0616  GLUCAP 288* 375* 373* 324* 348*  Antimicrobials/Microbiology: Anti-infectives (From admission, onward)    Start     Dose/Rate Route Frequency Ordered Stop   09/24/23 1200  vancomycin  (VANCOREADY) IVPB 500 mg/100 mL        500 mg 100 mL/hr over 60 Minutes Intravenous Every M-W-F (Hemodialysis) 09/21/23 1246 10/29/23 1159   09/20/23 1200  vancomycin  (VANCOCIN ) 750 mg in sodium chloride  0.9 % 250 mL IVPB  Status:  Discontinued        750 mg 265 mL/hr over 60 Minutes Intravenous Every M-W-F (Hemodialysis) 09/20/23 0446 09/21/23 1246   09/20/23 1045  vancomycin  (VANCOCIN ) 750 mg in sodium chloride  0.9 % 250 mL IVPB        750 mg 265 mL/hr over 60 Minutes Intravenous  Once 09/20/23 0959 09/20/23 1226   09/20/23 0945  vancomycin  (VANCOREADY) IVPB 750 mg/150 mL  Status:  Discontinued        750 mg 150 mL/hr over 60 Minutes Intravenous  Once 09/20/23 0855 09/20/23 0959   09/10/23 1200  vancomycin  (VANCOREADY) IVPB 750 mg/150 mL  Status:  Discontinued        750 mg 150 mL/hr over 60 Minutes Intravenous Every M-W-F (Hemodialysis) 09/09/23 2030 09/20/23 0446   09/09/23 2200  linezolid  (ZYVOX ) tablet 600 mg  Status:  Discontinued        600 mg Oral Every 12 hours 09/09/23 2035 09/10/23 0946         Component Value Date/Time   SDES BLOOD SITE NOT SPECIFIED 08/25/2023 1706   SPECREQUEST  08/25/2023 1706    BOTTLES DRAWN AEROBIC ONLY Blood Culture results may not be optimal due to an inadequate volume of blood received in culture bottles   CULT  08/25/2023 1706    NO GROWTH 5 DAYS Performed at [facility], [address], [place], [name] [address]    REPTSTATUS  08/30/2023 FINAL 08/25/2023 1706     Radiology Studies: No results found.   LOS: 19 days   Total time spent in review of labs and imaging, patient evaluation, formulation of plan, documentation and communication with family: 35 minutes  Mennie LAMY, MD [facility]  09/28/2023, 11:13 AM

## 2023-09-29 DIAGNOSIS — I120 Hypertensive chronic kidney disease with stage 5 chronic kidney disease or end stage renal disease: Secondary | ICD-10-CM | POA: Diagnosis not present

## 2023-09-29 DIAGNOSIS — N250 Renal osteodystrophy: Secondary | ICD-10-CM | POA: Diagnosis not present

## 2023-09-29 DIAGNOSIS — I330 Acute and subacute infective endocarditis: Secondary | ICD-10-CM | POA: Diagnosis not present

## 2023-09-29 DIAGNOSIS — Z992 Dependence on renal dialysis: Secondary | ICD-10-CM | POA: Diagnosis not present

## 2023-09-29 DIAGNOSIS — N186 End stage renal disease: Secondary | ICD-10-CM | POA: Diagnosis not present

## 2023-09-29 DIAGNOSIS — D631 Anemia in chronic kidney disease: Secondary | ICD-10-CM | POA: Diagnosis not present

## 2023-09-29 DIAGNOSIS — I38 Endocarditis, valve unspecified: Secondary | ICD-10-CM | POA: Diagnosis not present

## 2023-09-29 DIAGNOSIS — R7881 Bacteremia: Secondary | ICD-10-CM | POA: Diagnosis not present

## 2023-09-29 DIAGNOSIS — B9562 Methicillin resistant Staphylococcus aureus infection as the cause of diseases classified elsewhere: Secondary | ICD-10-CM | POA: Diagnosis not present

## 2023-09-29 LAB — RENAL FUNCTION PANEL
Albumin: 2.6 g/dL — ABNORMAL LOW (ref 3.5–5.0)
Anion gap: 16 — ABNORMAL HIGH (ref 5–15)
BUN: 42 mg/dL — ABNORMAL HIGH (ref 6–20)
CO2: 24 mmol/L (ref 22–32)
Calcium: 9 mg/dL (ref 8.9–10.3)
Chloride: 96 mmol/L — ABNORMAL LOW (ref 98–111)
Creatinine, Ser: 6.33 mg/dL — ABNORMAL HIGH (ref 0.61–1.24)
GFR, Estimated: 10 mL/min — ABNORMAL LOW (ref >60)
Glucose, Bld: 205 mg/dL — ABNORMAL HIGH (ref 70–99)
Phosphorus: 7.8 mg/dL — ABNORMAL HIGH (ref 2.5–4.6)
Potassium: 4.5 mmol/L (ref 3.5–5.1)
Sodium: 136 mmol/L (ref 135–145)

## 2023-09-29 LAB — CBC
HCT: 25.9 % — ABNORMAL LOW (ref 39.0–52.0)
Hemoglobin: 7.9 g/dL — ABNORMAL LOW (ref 13.0–17.0)
MCH: 28.4 pg (ref 26.0–34.0)
MCHC: 30.5 g/dL (ref 30.0–36.0)
MCV: 93.2 fL (ref 80.0–100.0)
Platelets: 185 10*3/uL (ref 150–400)
RBC: 2.78 MIL/uL — ABNORMAL LOW (ref 4.22–5.81)
RDW: 15.6 % — ABNORMAL HIGH (ref 11.5–15.5)
WBC: 5.5 10*3/uL (ref 4.0–10.5)
nRBC: 0 % (ref 0.0–0.2)

## 2023-09-29 LAB — GLUCOSE, CAPILLARY
Glucose-Capillary: 369 mg/dL — ABNORMAL HIGH (ref 70–99)
Glucose-Capillary: 328 mg/dL — ABNORMAL HIGH (ref 70–99)
Glucose-Capillary: 205 mg/dL — ABNORMAL HIGH (ref 70–99)

## 2023-09-29 MED ORDER — INSULIN GLARGINE-YFGN 100 UNIT/ML ~~LOC~~ SOLN
2.0000 [IU] | Freq: Once | SUBCUTANEOUS | Status: AC
  Administered 2023-09-29: 2 [IU] via SUBCUTANEOUS
  Filled 2023-09-29: qty 0.02

## 2023-09-29 MED ORDER — INSULIN GLARGINE-YFGN 100 UNIT/ML ~~LOC~~ SOLN
4.0000 [IU] | Freq: Every day | SUBCUTANEOUS | Status: DC
  Administered 2023-09-30 – 2023-10-02 (×3): 4 [IU] via SUBCUTANEOUS
  Filled 2023-09-29 (×4): qty 0.04

## 2023-09-29 MED ORDER — LIDOCAINE HCL (PF) 1 % IJ SOLN: 5.0000 mL | INTRAMUSCULAR | Status: DC | PRN

## 2023-09-29 MED ORDER — ANTICOAGULANT SODIUM CITRATE 4% (200MG/5ML) IV SOLN: 5.0000 mL | Status: DC | PRN

## 2023-09-29 MED ORDER — HEPARIN SODIUM (PORCINE) 1000 UNIT/ML DIALYSIS
1000.0000 [IU] | INTRAMUSCULAR | Status: DC | PRN
  Administered 2023-09-29: 4600 [IU]
  Filled 2023-09-29: qty 1

## 2023-09-29 MED ORDER — HEPARIN SODIUM (PORCINE) 1000 UNIT/ML IJ SOLN
INTRAMUSCULAR | Status: AC
  Filled 2023-09-29: qty 5

## 2023-09-29 MED ORDER — ALTEPLASE 2 MG IJ SOLR: 2.0000 mg | Freq: Once | INTRAMUSCULAR | Status: DC | PRN

## 2023-09-29 NOTE — Plan of Care (Signed)
  Problem: Clinical Measurements: Goal: Cardiovascular complication will be avoided Outcome: Progressing   Problem: Coping: Goal: Level of anxiety will decrease Outcome: Progressing   Problem: Fluid Volume: Goal: Ability to maintain a balanced intake and output will improve Outcome: Progressing   Problem: Metabolic: Goal: Ability to maintain appropriate glucose levels will improve Outcome: Progressing   Problem: Skin Integrity: Goal: Risk for impaired skin integrity will decrease Outcome: Progressing

## 2023-09-29 NOTE — Progress Notes (Signed)
 PROGRESS NOTE [name]  FMW:[id number] DOB: December 12, 1980 DOA: 09/09/2023 PCP: Celestia Rosaline SQUIBB, NP  Brief Narrative/Hospital Course: [name] is a 43 y.o. 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  While at [facility] he underwent cardiac cath showing non obstructive disease, craniotomy with abscess drainage on 09/01/23,. He had subarachnoid hemorrhage while on IV heparin , right third toe distal necrosis,was seen by podiatry, but family wanted conservative management.  Since his tertiary care needs have been completed, he was transferred back to [facility] for further management.  PTOT  recommending SNF and awaiting placement.    Subjective: Patient seen and examined this morning Nursing helping with having bowel movement complains of some constipation No new issues appears pleasant and calm Overnight afebrile heart rate 119 Blood sugar remains to 210-360s with ongoing noncompliance with diet   Assessment and Plan: Principal Problem:   Endocarditis Active Problems:   MRSA bacteremia   Endocarditis of tricuspid valve   ESRD on dialysis [facility])   Cognitive impairment   Insulin  dependent type 1 diabetes mellitus ([name])   Diabetic gastroparesis ([name])   Diabetic foot infection ([name])   Endocarditis of mitral valve   Cerebrovascular accident (CVA) due to embolism of cerebral artery ([name])   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 S/p TDC placed 10/18 lt groin. 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 Plan is to finish 8 weeks of vancomycin  3x weekly w/ HD EOT 10/26/22. Will need outpatient urology follow-up for prostatic abscess/[facility]?. ID clinic follow-up arranged for 1/14 @ 9.45 am Dr Dennise- ID signed off 12/16 Remains afebrile with normal WC count.  ESRD on HD Hyperkalemia Anemia of CKD CKD MBD: Continue HD per nephrology MWF-BP has been on the lowER side, added pre-HD midodrine  12/30 continue ASA, iron  infusion held due to IV antibiotics .Monitor calcium  and Phos and K leve. Continue binders with meals. Hb stable. K remains elevated due to noncompliance Recent Labs    09/19/23 1035 09/19/23 2030 09/20/23 1258 09/21/23 0620 09/22/23 0559 09/23/23 9366 09/24/23 0736 09/25/23 0302 09/26/23 0232 09/27/23 0434  BUN 53* 29* 37* 47* 41* 50* 66* 35* 42* 48*  CREATININE 5.87* 4.02* 4.89* 5.80* 5.44* 6.45* 7.71* 5.14* 6.39* 7.33*  CO2 21* 25 27 25 24 26  16* 22 25 23   K 6.2* 4.9 5.3* 6.3* 5.8* 6.0* >7.5* 5.2* 4.7 5.2*    Acute bilateral CVA: Suspected to be due to septic emboli.  Hypertension: BP on softer side in the low 100, on low-dose beta-blocker due to atrial arrhythmia, cont Pre HD midodrine    Left upper extremity swelling Acute DVT of left intrajugular vein left subclavian and left axillary vein on 08/01/26: Elevate left upper arm w/ sling or pillows and pain control.Continue Eliquis   Right third toe necrosis: Seen by podiatry at [facility] recommended surgical intervention but family opted for conservative measurement on antibiotics, wound care  SAH while on heparin : Stable now tolerating Eliquis .  Type 1 diabetes mellitus with uncontrolled hyperglycemia, poor compliance Gastroparesis: Patient was very agitated when diet was modified so he is on regular diet, blood sugar potassium remains  uncontrolled.  PTA on SSI 0-4 u 3 times daily, Lantus  4 units daily.continue SSI 0-6 units, increase Lantus   to 4 u 1/1- he is sensitive to insulin . Continue symptomatic management for gastroparesis Recent Labs  Lab 09/28/23 0616 09/28/23 1230 09/28/23 1852 09/28/23 2120 09/29/23 0703  GLUCAP 348* 345* 210* 317* 369*   Deconditioning/debility Left BKA status 09/2020: Patient with multiple complex comorbidities with deconditioning debility.  Continue PT OT as able.  Recommending skilled nursing facility upon discharge.  Patient has been previously seen by palliative care remains full code Is at high risk for readmission decompensation due to his noncompliance complex medical comorbidities, overall poor baseline functional status and ongoing decline.  Previously he mentioned he is willing to go to SNF if he can come and go as he wants.  Having waxing and waning mental status.  Mother is frequently involved in his care either at bedside or on the phone while rounding.  Psych issues ?  Cognitive impairment: Patient seems to be noncompliant, has been cussing at staffs and verbally aggressive at times, spit in a RN eye earlier in the admission. Suspect has underlying baseline psych issues.Psychiatry has seen the patient 12/31 and no further recommendation, able to make decision for disposition Cont hom Seroquel  12.5 mg am 25mg  bedtime.appears he was on Klonopin  0.25 prn anxiety PTA but on hold  Obesity: Patient's Body mass index is 30.25 kg/m. : Will benefit with PCP follow-up, weight loss  healthy lifestyle and outpatient sleep evaluation.  Consultation involved: Nephrology Neurosurgery Neurology Palliative care IR Cardiology/EP Cardiothoracic surgery Infectious disease Psychiatry 12/30  DVT prophylaxis:  Code Status:   Code Status: Full Code Family Communication: plan of care discussed with patient/mother updated on phone 12/30.  Family at bedside today Patient status is: Remains  hospitalized because of severity of illness Level of care: Progressive   Dispo: The patient is from: Home, lives with his mother.            Anticipated disposition: TBD-awaiting for skilled nursing facility Objective: Vitals last 24 hrs: Vitals:   09/28/23 1150 09/28/23 2005 09/29/23 0200 09/29/23 0913  BP: 103/77 107/63  (!) 108/48  Pulse: 85     Resp: 18 18 14 13   Temp: (!) 97.2 F (36.2 C) 98.7 F (37.1 C)    TempSrc: Oral Oral    SpO2: 96%   (P) 99%  Weight:      Height:       Weight change:   Physical Examination: General exam: alert awake, oriented HEENT:Oral mucosa moist, Ear/Nose WNL grossly Respiratory system: Bilaterally clear BS,no use of accessory muscle Cardiovascular system: S1 & S2 +, No JVD. Gastrointestinal system: Abdomen soft,NT,ND, BS+ Nervous System: Alert, awake, moving all extremities,and following commands. Extremities: Left BKA, Distal peripheral pulses palpable and warm.  Skin: No rashes,no icterus. MSK: Normal muscle bulk,tone, power  HD cath present in groin  Medications reviewed:  Scheduled Meds:  apixaban   5 mg Oral BID   bacitracin    Topical Daily   calcitRIOL   1.5 mcg Oral Q M,W,F-HD   Chlorhexidine  Gluconate Cloth  6 each Topical Q0600   darbepoetin (ARANESP ) injection - DIALYSIS  100 mcg Subcutaneous Q Sun-1800   gabapentin   100 mg Oral TID   Gerhardt's butt cream   Topical BID   influenza vac split trivalent PF  0.5 mL Intramuscular Tomorrow-1000   insulin  aspart  0-6 Units Subcutaneous TID AC & HS   insulin  glargine-yfgn  2 Units Subcutaneous Daily   loratadine   10 mg Oral Daily   metoprolol  tartrate  25 mg Oral BID   midodrine   10 mg Oral Q M,W,F-HD   pantoprazole   40 mg Oral Daily   povidone-Iodine    Topical Q0600   QUEtiapine   12.5 mg Oral q morning   QUEtiapine   25 mg Oral QHS   senna-docusate  2 tablet Oral BID   sevelamer  carbonate  1,600 mg Oral TID WC   sodium zirconium cyclosilicate   10 g Oral TID   Continuous  Infusions:  albumin  human 25 g (09/24/23 1456)   heparin  sodium (porcine)     vancomycin  500 mg (09/27/23 1657)   Diet Order             Diet regular Room service appropriate? Yes; Fluid consistency: Thin; Fluid restriction: 1500 mL Fluid  Diet effective now                  Intake/Output Summary (Last 24 hours) at 09/29/2023 9057 Last data filed at 09/29/2023 0600 Gross per 24 hour  Intake --  Output 2 ml  Net -2 ml   Net IO Since Admission: -6,367.04 mL [09/29/23 0942]  Wt Readings from Last 3 Encounters:  09/28/23 87.6 kg  08/24/23 78 kg  04/14/23 (S) 77.6 kg     Unresulted Labs (From admission, onward)     Start     Ordered   Signed and Held  Renal function panel  Once,   R       Question:  Specimen collection method  Answer:  Lab=Lab collect   Signed and Held   Signed and Held  Renal function panel  Once,   R       Question:  Specimen collection method  Answer:  Lab=Lab collect   Signed and Held   Signed and Held  CBC  Once,   R       Question:  Specimen collection method  Answer:  Lab=Lab collect   Signed and Held          Data Reviewed: I have personally reviewed following labs and imaging studies CBC: Recent Labs  Lab 09/24/23 1313 09/25/23 0301 09/26/23 0231 09/27/23 0250  WBC 9.8 6.4 6.4 8.7  NEUTROABS  --  5.0 4.3 6.3  HGB 7.9* 7.7* 7.4* 8.9*  HCT 25.7* 25.0* 23.7* 29.2*  MCV 91.8 92.6 92.2 93.6  PLT 181 154 168 153   Basic Metabolic Panel:  Recent Labs  Lab 09/23/23 0633 09/24/23 0736 09/25/23 0302 09/26/23 0232 09/27/23 0434  NA 136 131* 135 136 134*  K 6.0* >7.5* 5.2* 4.7 5.2*  CL 97* 94* 96* 98 94*  CO2 26 16* 22 25 23   GLUCOSE 72 285* 235* 266* 290*  BUN 50* 66* 35* 42* 48*  CREATININE 6.45* 7.71* 5.14* 6.39* 7.33*  CALCIUM  9.2 9.2 9.3 9.2 8.9  MG 2.0 2.0 1.8 1.9  --   PHOS 8.9* 11.3* 6.6* 8.1*  --    GFR: Estimated Creatinine Clearance: 13.6 mL/min (A) (by C-G formula based on SCr of 7.33 mg/dL (H)). Liver Function Tests:   Recent Labs  Lab 09/23/23 9366 09/24/23 0736 09/25/23 0302 09/26/23 0232  ALBUMIN  2.6* 2.6* 2.8* 2.6*   Recent Labs  Lab 09/28/23 0616 09/28/23 1230 09/28/23 1852 09/28/23 2120 09/29/23 0703  GLUCAP 348* 345* 210* 317* 369*  Antimicrobials/Microbiology: Anti-infectives (From admission, onward)    Start     Dose/Rate Route Frequency Ordered Stop   09/24/23 1200  vancomycin  (VANCOREADY) IVPB 500 mg/100 mL        500 mg 100 mL/hr over 60 Minutes Intravenous Every M-W-F (Hemodialysis) 09/21/23 1246 10/29/23 1159   09/20/23 1200  vancomycin  (VANCOCIN ) 750 mg in sodium chloride  0.9 % 250 mL IVPB  Status:  Discontinued        750 mg 265 mL/hr over 60 Minutes Intravenous Every M-W-F (Hemodialysis) 09/20/23 0446 09/21/23 1246   09/20/23 1045  vancomycin  (VANCOCIN ) 750 mg in sodium chloride  0.9 % 250 mL IVPB        750 mg 265 mL/hr over 60 Minutes Intravenous  Once 09/20/23 0959 09/20/23 1226   09/20/23 0945  vancomycin  (VANCOREADY) IVPB 750 mg/150 mL  Status:  Discontinued        750 mg 150 mL/hr over 60 Minutes Intravenous  Once 09/20/23 0855 09/20/23 0959   09/10/23 1200  vancomycin  (VANCOREADY) IVPB 750 mg/150 mL  Status:  Discontinued        750 mg 150 mL/hr over 60 Minutes Intravenous Every M-W-F (Hemodialysis) 09/09/23 2030 09/20/23 0446   09/09/23 2200  linezolid  (ZYVOX ) tablet 600 mg  Status:  Discontinued        600 mg Oral Every 12 hours 09/09/23 2035 09/10/23 0946         Component Value Date/Time   SDES BLOOD SITE NOT SPECIFIED 08/25/2023 1706   SPECREQUEST  08/25/2023 1706    BOTTLES DRAWN AEROBIC ONLY Blood Culture results may not be optimal due to an inadequate volume of blood received in culture bottles   CULT  08/25/2023 1706    NO GROWTH 5 DAYS Performed at [facility], [address], [place], [name] [address]    REPTSTATUS 08/30/2023 FINAL 08/25/2023 1706     Radiology Studies: No results found.   LOS: 20 days   Total time spent in review  of labs and imaging, patient evaluation, formulation of plan, documentation and communication with family: 35 minutes  Mennie LAMY, MD [facility]  09/29/2023, 9:42 AM

## 2023-09-29 NOTE — Progress Notes (Signed)
 Patient ID: [name], male   DOB: 27-May-1981, 43 y.o.   MRN: [id number] S: Complaining of right arm swelling and pain. O:BP (!) 108/48   Pulse 85   Temp 98.7 F (37.1 C) (Oral)   Resp 13   Ht 5' 7 (1.702 m)   Wt 87.6 kg   SpO2 (P) 99%   BMI 30.25 kg/m   Intake/Output Summary (Last 24 hours) at 09/29/2023 0954 Last data filed at 09/29/2023 0600 Gross per 24 hour  Intake --  Output 2 ml  Net -2 ml   Intake/Output: I/O last 3 completed shifts: In: -  Out: 2 [Stool:2]  Intake/Output this shift:  No intake/output data recorded. Weight change:  Gen: NAD CVS: RRR no rub Resp: CTA Abd: +BS, soft, NT/ND Ext: LUE +2 edema, s/p LAKA, right bicep with some tenderness and erythema.  Recent Labs  Lab 09/23/23 0633 09/24/23 0736 09/25/23 0302 09/26/23 0232 09/27/23 0434  NA 136 131* 135 136 134*  K 6.0* >7.5* 5.2* 4.7 5.2*  CL 97* 94* 96* 98 94*  CO2 26 16* 22 25 23   GLUCOSE 72 285* 235* 266* 290*  BUN 50* 66* 35* 42* 48*  CREATININE 6.45* 7.71* 5.14* 6.39* 7.33*  ALBUMIN  2.6* 2.6* 2.8* 2.6*  --   CALCIUM  9.2 9.2 9.3 9.2 8.9  PHOS 8.9* 11.3* 6.6* 8.1*  --    Liver Function Tests: Recent Labs  Lab 09/24/23 0736 09/25/23 0302 09/26/23 0232  ALBUMIN  2.6* 2.8* 2.6*   No results for input(s): LIPASE, AMYLASE in the last 168 hours. No results for input(s): AMMONIA in the last 168 hours. CBC: Recent Labs  Lab 09/24/23 1313 09/25/23 0301 09/26/23 0231 09/27/23 0250  WBC 9.8 6.4 6.4 8.7  NEUTROABS  --  5.0 4.3 6.3  HGB 7.9* 7.7* 7.4* 8.9*  HCT 25.7* 25.0* 23.7* 29.2*  MCV 91.8 92.6 92.2 93.6  PLT 181 154 168 153   Cardiac Enzymes: No results for input(s): CKTOTAL, CKMB, CKMBINDEX, TROPONINI in the last 168 hours. CBG: Recent Labs  Lab 09/28/23 0616 09/28/23 1230 09/28/23 1852 09/28/23 2120 09/29/23 0703  GLUCAP 348* 345* 210* 317* 369*    Iron  Studies: No results for input(s): IRON , TIBC, TRANSFERRIN, FERRITIN in the last 72  hours. Studies/Results: No results found.  apixaban   5 mg Oral BID   bacitracin    Topical Daily   calcitRIOL   1.5 mcg Oral Q M,W,F-HD   Chlorhexidine  Gluconate Cloth  6 each Topical Q0600   darbepoetin (ARANESP ) injection - DIALYSIS  100 mcg Subcutaneous Q Sun-1800   gabapentin   100 mg Oral TID   Gerhardt's butt cream   Topical BID   influenza vac split trivalent PF  0.5 mL Intramuscular Tomorrow-1000   insulin  aspart  0-6 Units Subcutaneous TID AC & HS   insulin  glargine-yfgn  2 Units Subcutaneous Once   [START ON 09/30/2023] insulin  glargine-yfgn  4 Units Subcutaneous Daily   loratadine   10 mg Oral Daily   metoprolol  tartrate  25 mg Oral BID   midodrine   10 mg Oral Q M,W,F-HD   pantoprazole   40 mg Oral Daily   povidone-Iodine    Topical Q0600   QUEtiapine   12.5 mg Oral q morning   QUEtiapine   25 mg Oral QHS   senna-docusate  2 tablet Oral BID   sevelamer  carbonate  1,600 mg Oral TID WC   sodium zirconium cyclosilicate   10 g Oral TID    BMET    Component Value Date/Time   NA 134 (  L) 09/27/2023 0434   NA 138 08/22/2014 0447   K 5.2 (H) 09/27/2023 0434   K 4.0 08/22/2014 0447   CL 94 (L) 09/27/2023 0434   CL 100 08/22/2014 0447   CO2 23 09/27/2023 0434   CO2 30 08/22/2014 0447   GLUCOSE 290 (H) 09/27/2023 0434   GLUCOSE 88 08/22/2014 0447   BUN 48 (H) 09/27/2023 0434   BUN 14 08/22/2014 0447   CREATININE 7.33 (H) 09/27/2023 0434   CREATININE 5.00 (H) 08/22/2014 0447   CALCIUM  8.9 09/27/2023 0434   CALCIUM  8.2 (L) 08/22/2014 0447   GFRNONAA 9 (L) 09/27/2023 0434   GFRNONAA 14 (L) 08/22/2014 0447   GFRNONAA 5 (L) 06/19/2014 0937   GFRAA 7 (L) 05/30/2020 1923   GFRAA 17 (L) 08/22/2014 0447   GFRAA 6 (L) 06/19/2014 0937   CBC    Component Value Date/Time   WBC 8.7 09/27/2023 0250   RBC 3.12 (L) 09/27/2023 0250   HGB 8.9 (L) 09/27/2023 0250   HGB 9.7 (L) 08/22/2014 0447   HCT 29.2 (L) 09/27/2023 0250   HCT 30.0 (L) 08/22/2014 0447   PLT 153 09/27/2023 0250   PLT  158 08/22/2014 0447   MCV 93.6 09/27/2023 0250   MCV 97 08/22/2014 0447   MCH 28.5 09/27/2023 0250   MCHC 30.5 09/27/2023 0250   RDW 15.3 09/27/2023 0250   RDW 15.3 (H) 08/22/2014 0447   LYMPHSABS 1.1 09/27/2023 0250   LYMPHSABS 1.7 08/22/2014 0447   MONOABS 0.6 09/27/2023 0250   MONOABS 0.8 08/22/2014 0447   EOSABS 0.6 (H) 09/27/2023 0250   EOSABS 0.4 08/22/2014 0447   BASOSABS 0.0 09/27/2023 0250   BASOSABS 0.1 08/22/2014 0447    OP HD: [facility] MWF  4h   400/1.5  77kg  L fem TDC 2/3 bath  Heparin  none   Assessment/ Plan: 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 Not a candidate for surgery here. SP line holiday w/ new TDC placed 10/18. Continues on IV vanc upon return from [place] . Adjusted plan per ID pharm is 750mg  MWF through 10/27/23.  Hyperkalemia - Pt not adhering to renal diet though sign on door with specific instructions for dietary.  Lokelma  has been increased to BID, remains high increase to TID.  Follow daily labs - K mid 4s now, cont TID lokelma .  SP I&D brain abscess: drained 12/04 at [facility]. Mentation improved. Acute bilat CVA: suspected consequence of septic emboli  ESRD: on HD MWF - BP has been on low side, will add pre HD midodrine .  Volume: on RA, L arm + edema, no other edema. 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  Needs to elevate arm Right arm swelling and pain - likely due to IV infiltration.  Will need to remove and follow for infection.  Anemia: Hb 8-9s, on ESA. Hold iron  infusion while on IV ABX. 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 Cont binders w/ meals.  Hypertension: BP on low side recently, on low dose BB w h/o atrial arrhythmia.   HD access:  L  femoral TDC placed on 10/18. 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 Type 1 DM: per primary Dispo - tolerated full HD 12/27 in chair. Cont efforts to dialyze in chair.  Appears SNF being pursued  Fairy RONAL Sellar, MD [facility]

## 2023-09-29 NOTE — Progress Notes (Signed)
 Received patient in bed to unit.  Alert and oriented.  Informed consent signed and in chart.   TX duration:3.5 Hours  Patient tolerated well.  Transported back to the room  Alert, without acute distress.  Hand-off given to patient's nurse.   Access used: Left FemoralCath HD internal jugular  Access issues: none  Total UF removed: 2.6L Medication(s) given: Oxycodone , Calcitriol    09/29/23 1846  Vitals  Temp 98.6 F (37 C)  Temp Source Oral  BP 101/62  MAP (mmHg) 91  Pulse Rate (!) 102  Oxygen  Therapy  SpO2 97 %  O2 Device Room Air  During Treatment Monitoring  HD Safety Checks Performed Yes  Intra-Hemodialysis Comments Tx completed;Tolerated well  Dialysis Fluid Bolus Normal Saline  Bolus Amount (mL) 300 mL  Post Treatment  Dialyzer Clearance Lightly streaked  Liters Processed 84  Fluid Removed (mL) 2600 mL  Tolerated HD Treatment Yes  Hemodialysis Catheter Left Femoral vein Double lumen Permanent (Tunneled)  Placement Date/Time: 07/16/23 1318   Serial / Lot #: [id number]  Expiration Date: 05/24/26  Time Out: Correct patient;Correct site;Correct procedure  Maximum sterile barrier precautions: Hand hygiene;Cap;Mask;Sterile gown;Sterile gloves;Large sterile ...  Site Condition No complications  Blue Lumen Status Flushed;Heparin  locked;Dead end cap in place  Red Lumen Status Flushed;Heparin  locked;Dead end cap in place  Purple Lumen Status N/A  Catheter fill solution Heparin  1000 units/ml  Catheter fill volume (Arterial) 2.3 cc  Catheter fill volume (Venous) 2.3  Dressing Type Transparent  Dressing Status Antimicrobial disc in place;Clean, Dry, Intact  Interventions Other (Comment) (deaccessed)  Drainage Description None  Dressing Change Due 10/06/23  Post treatment catheter status Capped and Clamped     Camellia Brasil LPN [facility]

## 2023-09-29 NOTE — Progress Notes (Signed)
 Patient said he will not take 12:00 meds until transport to hemodialysis arrives.  He refused his novolog insulin but after much encouragement, allowed the insulin to be administered for CBG 328.

## 2023-09-30 DIAGNOSIS — E1022 Type 1 diabetes mellitus with diabetic chronic kidney disease: Secondary | ICD-10-CM | POA: Diagnosis not present

## 2023-09-30 DIAGNOSIS — I059 Rheumatic mitral valve disease, unspecified: Secondary | ICD-10-CM | POA: Diagnosis not present

## 2023-09-30 DIAGNOSIS — D631 Anemia in chronic kidney disease: Secondary | ICD-10-CM | POA: Diagnosis not present

## 2023-09-30 DIAGNOSIS — I079 Rheumatic tricuspid valve disease, unspecified: Secondary | ICD-10-CM | POA: Diagnosis not present

## 2023-09-30 DIAGNOSIS — N186 End stage renal disease: Secondary | ICD-10-CM | POA: Diagnosis not present

## 2023-09-30 DIAGNOSIS — I38 Endocarditis, valve unspecified: Secondary | ICD-10-CM | POA: Diagnosis not present

## 2023-09-30 DIAGNOSIS — Z992 Dependence on renal dialysis: Secondary | ICD-10-CM | POA: Diagnosis not present

## 2023-09-30 DIAGNOSIS — N250 Renal osteodystrophy: Secondary | ICD-10-CM | POA: Diagnosis not present

## 2023-09-30 DIAGNOSIS — R7881 Bacteremia: Secondary | ICD-10-CM | POA: Diagnosis not present

## 2023-09-30 DIAGNOSIS — B9562 Methicillin resistant Staphylococcus aureus infection as the cause of diseases classified elsewhere: Secondary | ICD-10-CM | POA: Diagnosis not present

## 2023-09-30 DIAGNOSIS — I120 Hypertensive chronic kidney disease with stage 5 chronic kidney disease or end stage renal disease: Secondary | ICD-10-CM | POA: Diagnosis not present

## 2023-09-30 LAB — GLUCOSE, CAPILLARY
Glucose-Capillary: 273 mg/dL — ABNORMAL HIGH (ref 70–99)
Glucose-Capillary: 273 mg/dL — ABNORMAL HIGH (ref 70–99)
Glucose-Capillary: 270 mg/dL — ABNORMAL HIGH (ref 70–99)
Glucose-Capillary: 434 mg/dL — ABNORMAL HIGH (ref 70–99)

## 2023-09-30 MED ORDER — POLYETHYLENE GLYCOL 3350 17 G PO PACK: 17.0000 g | PACK | Freq: Every day | ORAL | Status: DC | PRN

## 2023-09-30 NOTE — Progress Notes (Signed)
 OT Cancellation Note  Patient Details Name: [name] MRN: [id number] DOB: [birth year]   Cancelled Treatment:    Reason Eval/Treat Not Completed: Patient declined, no reason specified. OT attempted again to see pt and pt stated , not today, I am getting ready to be wiped down, come back tomorrow. OT offered to return later today, however pt continued to refuse  [name] 09/30/2023, 11:26 AM

## 2023-09-30 NOTE — Progress Notes (Signed)
 OT Cancellation Note  Patient Details Name: [name] MRN: [id number] DOB: October 24, 1980   Cancelled Treatment:    Reason Eval/Treat Not Completed: Other (comment). Pt requested OT return later because he is on the phone talking with his aunt at this time. OT will follow up next available time  [name] 09/30/2023, 10:12 AM

## 2023-09-30 NOTE — Plan of Care (Signed)
 ?  Problem: Clinical Measurements: ?Goal: Will remain free from infection ?Outcome: Progressing ?  ?

## 2023-09-30 NOTE — Plan of Care (Signed)
  Problem: Education: Goal: Knowledge of General Education information will improve Description: Including pain rating scale, medication(s)/side effects and non-pharmacologic comfort measures Outcome: Progressing   Problem: Health Behavior/Discharge Planning: Goal: Ability to manage health-related needs will improve Outcome: Progressing   Problem: Clinical Measurements: Goal: Ability to maintain clinical measurements within normal limits will improve Outcome: Progressing Goal: Will remain free from infection Outcome: Progressing Goal: Diagnostic test results will improve Outcome: Progressing Goal: Cardiovascular complication will be avoided Outcome: Progressing   Problem: Activity: Goal: Risk for activity intolerance will decrease Outcome: Progressing   Problem: Coping: Goal: Level of anxiety will decrease Outcome: Progressing   Problem: Skin Integrity: Goal: Risk for impaired skin integrity will decrease Outcome: Progressing   Problem: Education: Goal: Ability to describe self-care measures that may prevent or decrease complications (Diabetes Survival Skills Education) will improve Outcome: Progressing Goal: Individualized Educational Video(s) Outcome: Progressing   Problem: Coping: Goal: Ability to adjust to condition or change in health will improve Outcome: Progressing   Problem: Fluid Volume: Goal: Ability to maintain a balanced intake and output will improve Outcome: Progressing   Problem: Health Behavior/Discharge Planning: Goal: Ability to identify and utilize available resources and services will improve Outcome: Progressing Goal: Ability to manage health-related needs will improve Outcome: Progressing   Problem: Metabolic: Goal: Ability to maintain appropriate glucose levels will improve Outcome: Progressing   Problem: Skin Integrity: Goal: Risk for impaired skin integrity will decrease Outcome: Progressing   Problem: Tissue Perfusion: Goal:  Adequacy of tissue perfusion will improve Outcome: Progressing

## 2023-09-30 NOTE — Inpatient Diabetes Management (Signed)
 Inpatient Diabetes Program Recommendations  AACE/ADA: New Consensus Statement on Inpatient Glycemic Control (2015)  Target Ranges:  Prepandial:   less than 140 mg/dL      Peak postprandial:   less than 180 mg/dL (1-2 hours)      Critically ill patients:  140 - 180 mg/dL   Lab Results  Component Value Date   GLUCAP 273 (H) 09/30/2023   HGBA1C 8.9 (H) 07/10/2023    Review of Glycemic Control  Latest Reference Range & Units 09/28/23 06:16 09/28/23 12:30 09/28/23 18:52 09/28/23 21:20 09/29/23 07:03 09/29/23 13:35 09/29/23 22:29 09/30/23 06:32  Glucose-Capillary 70 - 99 mg/dL 651 (H) 654 (H) 789 (H) 317 (H) 369 (H) 328 (H) 205 (H) 273 (H)   Diabetes history: DM1 Outpatient Diabetes medications: Lantus  4 daily, Humalog 0-4 TID Current orders for Inpatient glycemic control:  Semglee  4 units Daily Novolog  0-6 TID with meals and  HS   Inpatient Diabetes Program Recommendations:    -   Increase Semglee  to 5 units  Continue to follow while inpatient.  Thanks, Clotilda Bull RN, MSN, BC-ADM Inpatient Diabetes Coordinator Team Pager [phone] (8a-5p)

## 2023-09-30 NOTE — TOC Progression Note (Signed)
 Transition of Care [facility]) - Progression Note    Patient Details  Name: [name] MRN: [id number] Date of Birth: 1980/10/18  Transition of Care [facility]) CM/SW Contact  [name], [name] Phone Number: 09/30/2023, 2:28 PM  Clinical Narrative:     CSW met with pt bedside. Updated him that [facility] cannot offer a bed. CSW discussed current SNF options with pt. He states he would have to discuss further with his mother when she gets off work today. TOC will follow up for SNF choice.   Expected Discharge Plan: Skilled Nursing Facility Barriers to Discharge: Insurance Authorization, Other (must enter comment) (bed choice)  Expected Discharge Plan and Services                                               Social Determinants of Health (SDOH) Interventions SDOH Screenings   Food Insecurity: No Food Insecurity (09/09/2023)  Housing: Unknown (09/09/2023)  Transportation Needs: No Transportation Needs (09/09/2023)  Utilities: Not At Risk (09/09/2023)  Alcohol Screen: Low Risk  (02/15/2023)  Depression (PHQ2-9): Low Risk  (02/15/2023)  Financial Resource Strain: Low Risk  (02/15/2023)  Physical Activity: Inactive (02/15/2023)  Social Connections: Socially Integrated (02/15/2023)  Stress: No Stress Concern Present (02/15/2023)  Tobacco Use: Low Risk  (09/12/2023)  Recent Concern: Tobacco Use - Medium Risk (09/01/2023)   Received from [facility]    Readmission Risk Interventions     No data to display

## 2023-09-30 NOTE — Progress Notes (Signed)
 Patient ID: [name], male   DOB: [birth year], 43 y.o.   MRN: [id number] S: No new complaints and tolerated HD well yesterday with 2.6L UF O:BP 127/74 (BP Location: Right Arm)   Pulse (!) 110   Temp 98.2 F (36.8 C) (Oral)   Resp 12   Ht 5' 7 (1.702 m)   Wt 85.5 kg   SpO2 100%   BMI 29.52 kg/m   Intake/Output Summary (Last 24 hours) at 09/30/2023 0951 Last data filed at 09/29/2023 1846 Gross per 24 hour  Intake --  Output 2600 ml  Net -2600 ml   Intake/Output: I/O last 3 completed shifts: In: -  Out: 2601 [Other:2600; Stool:1]  Intake/Output this shift:  No intake/output data recorded. Weight change:  Gen: NAD CVS: tachy Resp: CTA Abd: +BS, soft, NT/ND Ext: 2+ LUE edema, s/p LAKA  Recent Labs  Lab 09/24/23 0736 09/25/23 0302 09/26/23 0232 09/27/23 0434 09/29/23 1515  NA 131* 135 136 134* 136  K >7.5* 5.2* 4.7 5.2* 4.5  CL 94* 96* 98 94* 96*  CO2 16* 22 25 23 24   GLUCOSE 285* 235* 266* 290* 205*  BUN 66* 35* 42* 48* 42*  CREATININE 7.71* 5.14* 6.39* 7.33* 6.33*  ALBUMIN  2.6* 2.8* 2.6*  --  2.6*  CALCIUM  9.2 9.3 9.2 8.9 9.0  PHOS 11.3* 6.6* 8.1*  --  7.8*   Liver Function Tests: Recent Labs  Lab 09/25/23 0302 09/26/23 0232 09/29/23 1515  ALBUMIN  2.8* 2.6* 2.6*   No results for input(s): LIPASE, AMYLASE in the last 168 hours. No results for input(s): AMMONIA in the last 168 hours. CBC: Recent Labs  Lab 09/24/23 1313 09/25/23 0301 09/26/23 0231 09/27/23 0250 09/29/23 1515  WBC 9.8 6.4 6.4 8.7 5.5  NEUTROABS  --  5.0 4.3 6.3  --   HGB 7.9* 7.7* 7.4* 8.9* 7.9*  HCT 25.7* 25.0* 23.7* 29.2* 25.9*  MCV 91.8 92.6 92.2 93.6 93.2  PLT 181 154 168 153 185   Cardiac Enzymes: No results for input(s): CKTOTAL, CKMB, CKMBINDEX, TROPONINI in the last 168 hours. CBG: Recent Labs  Lab 09/28/23 2120 09/29/23 0703 09/29/23 1335 09/29/23 2229 09/30/23 0632  GLUCAP 317* 369* 328* 205* 273*    Iron  Studies: No results for input(s): IRON ,  TIBC, TRANSFERRIN, FERRITIN in the last 72 hours. Studies/Results: No results found.  apixaban   5 mg Oral BID   bacitracin    Topical Daily   calcitRIOL   1.5 mcg Oral Q M,W,F-HD   Chlorhexidine  Gluconate Cloth  6 each Topical Q0600   darbepoetin (ARANESP ) injection - DIALYSIS  100 mcg Subcutaneous Q Sun-1800   gabapentin   100 mg Oral TID   Gerhardt's butt cream   Topical BID   influenza vac split trivalent PF  0.5 mL Intramuscular Tomorrow-1000   insulin  aspart  0-6 Units Subcutaneous TID AC & HS   insulin  glargine-yfgn  4 Units Subcutaneous Daily   loratadine   10 mg Oral Daily   metoprolol  tartrate  25 mg Oral BID   midodrine   10 mg Oral Q M,W,F-HD   pantoprazole   40 mg Oral Daily   povidone-Iodine    Topical Q0600   QUEtiapine   12.5 mg Oral q morning   QUEtiapine   25 mg Oral QHS   senna-docusate  2 tablet Oral BID   sevelamer  carbonate  1,600 mg Oral TID WC   sodium zirconium cyclosilicate   10 g Oral TID    BMET    Component Value Date/Time   NA 136 09/29/2023 1515  NA 138 08/22/2014 0447   K 4.5 09/29/2023 1515   K 4.0 08/22/2014 0447   CL 96 (L) 09/29/2023 1515   CL 100 08/22/2014 0447   CO2 24 09/29/2023 1515   CO2 30 08/22/2014 0447   GLUCOSE 205 (H) 09/29/2023 1515   GLUCOSE 88 08/22/2014 0447   BUN 42 (H) 09/29/2023 1515   BUN 14 08/22/2014 0447   CREATININE 6.33 (H) 09/29/2023 1515   CREATININE 5.00 (H) 08/22/2014 0447   CALCIUM  9.0 09/29/2023 1515   CALCIUM  8.2 (L) 08/22/2014 0447   GFRNONAA 10 (L) 09/29/2023 1515   GFRNONAA 14 (L) 08/22/2014 0447   GFRNONAA 5 (L) 06/19/2014 0937   GFRAA 7 (L) 05/30/2020 1923   GFRAA 17 (L) 08/22/2014 0447   GFRAA 6 (L) 06/19/2014 0937   CBC    Component Value Date/Time   WBC 5.5 09/29/2023 1515   RBC 2.78 (L) 09/29/2023 1515   HGB 7.9 (L) 09/29/2023 1515   HGB 9.7 (L) 08/22/2014 0447   HCT 25.9 (L) 09/29/2023 1515   HCT 30.0 (L) 08/22/2014 0447   PLT 185 09/29/2023 1515   PLT 158 08/22/2014 0447   MCV 93.2  09/29/2023 1515   MCV 97 08/22/2014 0447   MCH 28.4 09/29/2023 1515   MCHC 30.5 09/29/2023 1515   RDW 15.6 (H) 09/29/2023 1515   RDW 15.3 (H) 08/22/2014 0447   LYMPHSABS 1.1 09/27/2023 0250   LYMPHSABS 1.7 08/22/2014 0447   MONOABS 0.6 09/27/2023 0250   MONOABS 0.8 08/22/2014 0447   EOSABS 0.6 (H) 09/27/2023 0250   EOSABS 0.4 08/22/2014 0447   BASOSABS 0.0 09/27/2023 0250   BASOSABS 0.1 08/22/2014 0447   OP HD: [facility] MWF  4h   400/1.5  77kg  L fem TDC 2/3 bath  Heparin  none   Assessment/ Plan: 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 Not a candidate for surgery here. SP line holiday w/ new TDC placed 10/18. Continues on IV vanc upon return from [place] . Adjusted plan per ID pharm is 750mg  MWF through 10/27/23. Hyperkalemia - Pt not adhering to renal diet though sign on door with specific instructions for dietary.  Lokelma  has been increased to BID, remains high increase to TID.  Follow daily labs - K mid 4s now, cont TID lokelma .  SP I&D brain abscess: drained 12/04 at [facility]. Mentation improved. Acute bilat CVA: suspected consequence of septic emboli  ESRD: on HD MWF - BP has been on low side, will add pre HD midodrine .  Volume: on RA, L arm + edema, no other edema. 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  Needs to elevate arm Right arm swelling and pain - likely due to IV infiltration.  Will need to remove and follow for infection.  Anemia: Hb 8-9s, on ESA. Hold iron  infusion while on IV ABX. 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 Cont binders w/ meals.  Hypertension: BP on low side recently, on low dose BB w h/o atrial arrhythmia.   HD access:  L  femoral TDC placed on 10/18. 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 Type 1 DM: per primary Dispo - tolerated full HD 12/27 in chair. Cont efforts to dialyze in chair.  Appears SNF being pursued  Fairy RONAL Sellar, MD [employer] [phone]

## 2023-09-30 NOTE — Progress Notes (Signed)
 IR consulted for upper extremity HD access. CT chest ordered for further evaluation but patient has refused this study three times per [facility] CT techs. Order for CT cancelled.   If patient becomes agreeable to imaging study please re-consult IR.  Warren Dais, AGACNP-BC 09/30/2023, 10:47 AM

## 2023-09-30 NOTE — Progress Notes (Signed)
 PROGRESS NOTE    [name]  FMW:[id number] DOB: 08/26/1981 DOA: 09/09/2023 PCP: Celestia Rosaline SQUIBB, NP   Brief Narrative: [name] is a 43 y.o. male with medical history significant of ESRD on hemodialysis (not fully compliant), insulin -dependent type 1 diabetes, gastroparesis, Left AKA, admitted to [facility] on 07/09/23 for DKA, Sepsis and hospital course significant for MV AND TV  MRSA endocarditis, followed by acute MCA stroke , left occipital and bilateral cerebellar strokes consistent with septic emboli,  Brain abscess from MRSA, left IJ DVT was transferred to [facility] for MV repair on 11/29.  While at [facility] he underwent cardiac cath showing non obstructive disease, craniotomy with abscess drainage on 09/01/23,. He had subarachnoid hemorrhage while on IV heparin , right third toe distal necrosis,was seen by podiatry, but family wanted conservative management.  Since his tertiary care needs have been completed, he was transferred back to [facility] for further management.  PTOT  recommending SNF and awaiting placement.   Assessment and Plan:  MRSA TV/MV endocarditis Positive MRSA blood cultures. Diagnosed on Transesophageal Echocardiogram. Patient managed on Vancomycin . At outside hospital, patient underwent a left heart catheterization which was significant for nonobstructive disease. Infectious disease recommendations to continue Vancomycin  IV to complete 8 weeks of treatment with an end date of 1/29. Recommendation for outpatient ID follow-up with appointment scheduled for 10/12/2023.  Prostate fluid collection/abscess Urology consulted at outside hospital and per their assessment, fluid collection was secondary to dilated seminal vesicle with no surgical intervention recommended. Infectious disease with continued concern for possible abscess. Patient covered with vancomycin  with recommendation for outpatient urology follow-up.  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 Patient evaluated by neurosurgery. Concern for abscess ruled out by neurosurgery initially. No surgical intervention per neurosurgery. However, at outside hospital, patient was evaluated by neurosurgery who performed a craniotomy for abscess drainage.  ESRD on HD Nephrology consulted and managing hemodialysis.  Hyperkalemia Improved with HD.  Anemia of chronic kidney disease Hemoglobin stable.  CKD mineral bone disease -Continue calcitriol   Acute CVA MRI confirms large right MCA branch infarct. MRA head (11/25) significant for large necrotic appearing lesion within the right frontal lobe within area of the prior right MCA territory infarct with vasogenic edema and 8 mm leftward midline shift. LDL of 34. Hemoglobin A1C of 8.9%. Transthoracic Echocardiogram (10/13) significant for an LVEF of 25-30% with concern for AV/MV abnormality with Transesophageal Echocardiogram (10/16 & 10/22) significant for MV/TV vegetations. Neurology recommendations for Eliquis . PT/OT recommendations for SNF.  Primary hypertension Patient is on metoprolol  as an outpatient. -Continue metoprolol   Acute left upper extremity DVT -Continue Eliquis   Right third toe necrosis Noted. Per documentation, patient/family opting for conservative management and decline surgical management.  Diabetes mellitus type 1 Poorly controlled with hyperglycemia. Hemoglobin A1C of 8.9%. Partly due to poor dietary choice. Complicated by intermittent hypoglycemia, as well. -Continue Semglee  4 units and SSI  Gastroparesis Noted. Patient is not on medication management apart from insulin  for blood sugar control. Patient without symptoms and is tolerating his diet.  History of left BKA Noted. Patient seen by PT/OT with recommendation for SNF on discharge.  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 Patient started on Seroquel  for behavioral change management.  Overweight Estimated body mass index is 29.52  kg/m as calculated from the following:   Height as of this encounter: 5' 7 (1.702 m).   Weight as of this encounter: 85.5 kg.  Pressure injury Left buttock. Not present on admission.   DVT  prophylaxis: Eliquis  Code Status:   Code Status: Full Code Family Communication: None at bedside Disposition Plan: Discharge to SNF pending bed availability   Consultants:    Procedures:    Antimicrobials: Vancomycin  Linezolid     Subjective: Patient reports no concerns.  Objective: BP 127/74 (BP Location: Right Arm)   Pulse (!) 110   Temp 98.2 F (36.8 C) (Oral)   Resp 12   Ht 5' 7 (1.702 m)   Wt 85.5 kg   SpO2 100%   BMI 29.52 kg/m   Examination:  General exam: Appears calm and comfortable Respiratory system: Clear to auscultation. Respiratory effort normal. Cardiovascular system: S1 & S2 heard, RRR Gastrointestinal system: Abdomen is nondistended, soft and nontender. Normal bowel sounds heard. Central nervous system: Alert and oriented. Musculoskeletal: No edema. No calf tenderness   Data Reviewed: I have personally reviewed following labs and imaging studies  CBC Lab Results  Component Value Date   WBC 5.5 09/29/2023   RBC 2.78 (L) 09/29/2023   HGB 7.9 (L) 09/29/2023   HCT 25.9 (L) 09/29/2023   MCV 93.2 09/29/2023   MCH 28.4 09/29/2023   PLT 185 09/29/2023   MCHC 30.5 09/29/2023   RDW 15.6 (H) 09/29/2023   LYMPHSABS 1.1 09/27/2023   MONOABS 0.6 09/27/2023   EOSABS 0.6 (H) 09/27/2023   BASOSABS 0.0 09/27/2023     Last metabolic panel Lab Results  Component Value Date   NA 136 09/29/2023   K 4.5 09/29/2023   CL 96 (L) 09/29/2023   CO2 24 09/29/2023   BUN 42 (H) 09/29/2023   CREATININE 6.33 (H) 09/29/2023   GLUCOSE 205 (H) 09/29/2023   GFRNONAA 10 (L) 09/29/2023   GFRAA 7 (L) 05/30/2020   CALCIUM  9.0 09/29/2023   PHOS 7.8 (H) 09/29/2023   PROT 7.6 09/09/2023   ALBUMIN  2.6 (L) 09/29/2023   BILITOT 0.8 09/09/2023   ALKPHOS 116 09/09/2023    AST 15 09/09/2023   ALT 12 09/09/2023   ANIONGAP 16 (H) 09/29/2023    GFR: Estimated Creatinine Clearance: 15.6 mL/min (A) (by C-G formula based on SCr of 6.33 mg/dL (H)).  No results found for this or any previous visit (from the past 240 hours).    Radiology Studies: No results found.    LOS: 21 days    Elgin Lam, MD [facility] 09/30/2023, 2:23 PM   If 7PM-7AM, please contact night-coverage [email or web address]

## 2023-10-01 DIAGNOSIS — B9562 Methicillin resistant Staphylococcus aureus infection as the cause of diseases classified elsewhere: Secondary | ICD-10-CM | POA: Diagnosis not present

## 2023-10-01 DIAGNOSIS — I079 Rheumatic tricuspid valve disease, unspecified: Secondary | ICD-10-CM | POA: Diagnosis not present

## 2023-10-01 DIAGNOSIS — N250 Renal osteodystrophy: Secondary | ICD-10-CM | POA: Diagnosis not present

## 2023-10-01 DIAGNOSIS — I38 Endocarditis, valve unspecified: Secondary | ICD-10-CM | POA: Diagnosis not present

## 2023-10-01 DIAGNOSIS — N186 End stage renal disease: Secondary | ICD-10-CM | POA: Diagnosis not present

## 2023-10-01 DIAGNOSIS — D631 Anemia in chronic kidney disease: Secondary | ICD-10-CM | POA: Diagnosis not present

## 2023-10-01 DIAGNOSIS — R7881 Bacteremia: Secondary | ICD-10-CM | POA: Diagnosis not present

## 2023-10-01 DIAGNOSIS — I059 Rheumatic mitral valve disease, unspecified: Secondary | ICD-10-CM | POA: Diagnosis not present

## 2023-10-01 DIAGNOSIS — Z992 Dependence on renal dialysis: Secondary | ICD-10-CM | POA: Diagnosis not present

## 2023-10-01 DIAGNOSIS — E1022 Type 1 diabetes mellitus with diabetic chronic kidney disease: Secondary | ICD-10-CM | POA: Diagnosis not present

## 2023-10-01 DIAGNOSIS — I120 Hypertensive chronic kidney disease with stage 5 chronic kidney disease or end stage renal disease: Secondary | ICD-10-CM | POA: Diagnosis not present

## 2023-10-01 LAB — GLUCOSE, CAPILLARY
Glucose-Capillary: 167 mg/dL — ABNORMAL HIGH (ref 70–99)
Glucose-Capillary: 200 mg/dL — ABNORMAL HIGH (ref 70–99)
Glucose-Capillary: 264 mg/dL — ABNORMAL HIGH (ref 70–99)

## 2023-10-01 LAB — CBC
HCT: 24.6 % — ABNORMAL LOW (ref 39.0–52.0)
Hemoglobin: 7.4 g/dL — ABNORMAL LOW (ref 13.0–17.0)
MCH: 28.2 pg (ref 26.0–34.0)
MCHC: 30.1 g/dL (ref 30.0–36.0)
MCV: 93.9 fL (ref 80.0–100.0)
Platelets: 190 10*3/uL (ref 150–400)
RBC: 2.62 MIL/uL — ABNORMAL LOW (ref 4.22–5.81)
RDW: 15.8 % — ABNORMAL HIGH (ref 11.5–15.5)
WBC: 6.2 10*3/uL (ref 4.0–10.5)
nRBC: 0 % (ref 0.0–0.2)

## 2023-10-01 LAB — RENAL FUNCTION PANEL
Albumin: 2.4 g/dL — ABNORMAL LOW (ref 3.5–5.0)
Anion gap: 18 — ABNORMAL HIGH (ref 5–15)
BUN: 35 mg/dL — ABNORMAL HIGH (ref 6–20)
CO2: 24 mmol/L (ref 22–32)
Calcium: 9.2 mg/dL (ref 8.9–10.3)
Chloride: 96 mmol/L — ABNORMAL LOW (ref 98–111)
Creatinine, Ser: 6.38 mg/dL — ABNORMAL HIGH (ref 0.61–1.24)
GFR, Estimated: 10 mL/min — ABNORMAL LOW (ref >60)
Glucose, Bld: 199 mg/dL — ABNORMAL HIGH (ref 70–99)
Phosphorus: 8.4 mg/dL — ABNORMAL HIGH (ref 2.5–4.6)
Potassium: 4.8 mmol/L (ref 3.5–5.1)
Sodium: 138 mmol/L (ref 135–145)

## 2023-10-01 LAB — VANCOMYCIN, RANDOM: Vancomycin Rm: 15 ug/mL

## 2023-10-01 MED ORDER — HEPARIN SODIUM (PORCINE) 1000 UNIT/ML IJ SOLN
4600.0000 [IU] | Freq: Once | INTRAMUSCULAR | Status: AC
  Administered 2023-10-04: 4600 [IU]
  Filled 2023-10-01: qty 5
  Filled 2023-10-01: qty 4.6

## 2023-10-01 MED ORDER — HEPARIN SODIUM (PORCINE) 1000 UNIT/ML IJ SOLN
4600.0000 [IU] | Freq: Once | INTRAMUSCULAR | Status: AC
  Administered 2023-10-01: 4600 [IU]
  Filled 2023-10-01: qty 4.6
  Filled 2023-10-01: qty 5

## 2023-10-01 NOTE — Progress Notes (Signed)
 Pharmacy Antibiotic Note  [name] is a 43 y.o. 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 Pt was transferred to [name] for MV repair 11/29 and craniotomy with abscess drainage on 12/4. Pharmacy has been consulted for continued Vancomycin  dosing.  Spoke with pharmacist at [name], vancomycin  was being dose based on pre-HD levels - pt mostly getting 750mg  with HD but occasionally would get 500mg  instead. Last vanc random (pre-HD level) at [name] of 19.8 mcg/ml on 12/11 (therapeutic for goal 15-25 mcg/ml) and pt received vanc 750mg  IV 12/11 pm post HD  Vancomycin  level this AM 15  Plan: - Continue Vancomycin  500 mg IV post HD MWF through 10/27/23 - Will monitor HD tolerance and dosing - F/u ID recommendations  Height: 5' 7 (170.2 cm) Weight: 85.5 kg (188 lb 7.9 oz) IBW/kg (Calculated) : 66.1  Temp (24hrs), Avg:98.1 F (36.7 C), Min:97.4 F (36.3 C), Max:99.3 F (37.4 C)  Recent Labs  Lab 09/25/23 0301 09/25/23 0302 09/26/23 0231 09/26/23 0232 09/27/23 0250 09/27/23 0434 09/29/23 1515 10/01/23 1135 10/01/23 1205  WBC 6.4  --  6.4  --  8.7  --  5.5  --  6.2  CREATININE  --  5.14*  --  6.39*  --  7.33* 6.33*  --  6.38*  VANCORANDOM  --   --   --   --   --   --   --  15  --     Estimated Creatinine Clearance: 15.4 mL/min (A) (by C-G formula based on SCr of 6.38 mg/dL (H)).    No Known Allergies  Antimicrobials this admission: Vancomycin  10/12 >>(10/27/23) Cefepime  10/21 >> 10/23; restart 10/25 >> 10/29 Linezolid  11/27 >> 12/10   Vancomycin  levels  -10/17 preHD>>25 -10/24: VR= 16 -11/1: VR= 24 -11/8 VR 22 -11/15 VR 22  -11/22 VR 28 - held dose x1, decrease to 500mg  -11/28 VR 18 - cont 500mg  MWF 11/29 - Transferred to [name] -12/11 VR 19.8 (at [name]) - mostly 750mg  with HD (occasionally 500mg  instead) - 12/16 VR 25 mcg/mL- continue 750 mg iv MWF - 12/24 VR 27 mcg/mL- hold 1 dose / reduce to 500 mg iv MWF  thereafter - 10/01/23 VR 15 - therapeutic    Microbiology results: 10/12 MRSA PCR >> positive 10/12 CDiff antigen +, toxin -, PCR - >> negative 10/12 BCx >> 4/4 MRSA 10/13 BCx >> Negative 10/17 BCx >> Negative 10/21 RCx >> Negative 11/26 Bcx >> negative 11/27 Bcx >> Negative  Thank you Olam Monte, PharmD 10/01/2023 1:59 PM

## 2023-10-01 NOTE — Progress Notes (Signed)
   10/01/23 1840  Vitals  Temp 98.4 F (36.9 C)  Pulse Rate (!) 126  Resp 13  BP 136/82  SpO2 95 %  O2 Device Room Air  Weight 99 kg  Type of Weight Post-Dialysis  Oxygen  Therapy  Patient Activity (if Appropriate) In bed  Pulse Oximetry Type Continuous  Oximetry Probe Site Changed No  Post Treatment  Dialyzer Clearance Lightly streaked  Hemodialysis Intake (mL) 0 mL  Liters Processed 84  Fluid Removed (mL) 400 mL (see progress notes)  Tolerated HD Treatment Yes   Received patient in bed to unit.  Alert and oriented.  Informed consent signed and in chart.   TX duration:3.30  Patient tolerated well.  Transported back to the room  Alert, without acute distress.  Hand-off given to patient's nurse.   Access used: Lfemoral tunnelled HD catheter Access issues: no complications  Total UF removed: 500---nephrology is aware Medication(s) given: albumin  --total of 50grams iv--   [name]

## 2023-10-01 NOTE — Plan of Care (Signed)
   Problem: Education: Goal: Knowledge of General Education information will improve Description Including pain rating scale, medication(s)/side effects and non-pharmacologic comfort measures Outcome: Progressing   Problem: Health Behavior/Discharge Planning: Goal: Ability to manage health-related needs will improve Outcome: Progressing

## 2023-10-01 NOTE — Plan of Care (Signed)
   Problem: Education: Goal: Knowledge of General Education information will improve Description: Including pain rating scale, medication(s)/side effects and non-pharmacologic comfort measures Outcome: Progressing   Problem: Skin Integrity: Goal: Risk for impaired skin integrity will decrease Outcome: Progressing

## 2023-10-01 NOTE — Progress Notes (Signed)
 Patient ID: KNOX CERVI, male   DOB: September 25, 1981, 43 y.o.   MRN: [id number] S: no new complaints or changes. O:BP (!) 122/92 (BP Location: Right Leg) Comment (BP Location): per pt request  Pulse (!) 110   Temp 97.6 F (36.4 C) (Oral)   Resp 15   Ht 5' 7 (1.702 m)   Wt 85.5 kg   SpO2 100%   BMI 29.52 kg/m   Intake/Output Summary (Last 24 hours) at 10/01/2023 1042 Last data filed at 09/30/2023 1300 Gross per 24 hour  Intake 400 ml  Output --  Net 400 ml   Intake/Output: I/O last 3 completed shifts: In: 720 [P.O.:720] Out: -   Intake/Output this shift:  No intake/output data recorded. Weight change:  Gen: NAD CVS: tachy Resp: CTA Abd: +BS, soft,NT/ND Ext: s/p LAKA, no edema on RLE, !+ edema LUE  Recent Labs  Lab 09/25/23 0302 09/26/23 0232 09/27/23 0434 09/29/23 1515  NA 135 136 134* 136  K 5.2* 4.7 5.2* 4.5  CL 96* 98 94* 96*  CO2 22 25 23 24   GLUCOSE 235* 266* 290* 205*  BUN 35* 42* 48* 42*  CREATININE 5.14* 6.39* 7.33* 6.33*  ALBUMIN  2.8* 2.6*  --  2.6*  CALCIUM  9.3 9.2 8.9 9.0  PHOS 6.6* 8.1*  --  7.8*   Liver Function Tests: Recent Labs  Lab 09/25/23 0302 09/26/23 0232 09/29/23 1515  ALBUMIN  2.8* 2.6* 2.6*   No results for input(s): LIPASE, AMYLASE in the last 168 hours. No results for input(s): AMMONIA in the last 168 hours. CBC: Recent Labs  Lab 09/24/23 1313 09/25/23 0301 09/26/23 0231 09/27/23 0250 09/29/23 1515  WBC 9.8 6.4 6.4 8.7 5.5  NEUTROABS  --  5.0 4.3 6.3  --   HGB 7.9* 7.7* 7.4* 8.9* 7.9*  HCT 25.7* 25.0* 23.7* 29.2* 25.9*  MCV 91.8 92.6 92.2 93.6 93.2  PLT 181 154 168 153 185   Cardiac Enzymes: No results for input(s): CKTOTAL, CKMB, CKMBINDEX, TROPONINI in the last 168 hours. CBG: Recent Labs  Lab 09/30/23 0632 09/30/23 1105 09/30/23 1634 09/30/23 2118 10/01/23 0537  GLUCAP 273* 273* 270* 434* 167*    Iron  Studies: No results for input(s): IRON , TIBC, TRANSFERRIN, FERRITIN in the last 72  hours. Studies/Results: No results found.  apixaban   5 mg Oral BID   bacitracin    Topical Daily   calcitRIOL   1.5 mcg Oral Q M,W,F-HD   Chlorhexidine  Gluconate Cloth  6 each Topical Q0600   darbepoetin (ARANESP ) injection - DIALYSIS  100 mcg Subcutaneous Q Sun-1800   gabapentin   100 mg Oral TID   Gerhardt's butt cream   Topical BID   influenza vac split trivalent PF  0.5 mL Intramuscular Tomorrow-1000   insulin  aspart  0-6 Units Subcutaneous TID AC & HS   insulin  glargine-yfgn  4 Units Subcutaneous Daily   loratadine   10 mg Oral Daily   metoprolol  tartrate  25 mg Oral BID   midodrine   10 mg Oral Q M,W,F-HD   pantoprazole   40 mg Oral Daily   povidone-Iodine    Topical Q0600   QUEtiapine   12.5 mg Oral q morning   QUEtiapine   25 mg Oral QHS   senna-docusate  2 tablet Oral BID   sevelamer  carbonate  1,600 mg Oral TID WC   sodium zirconium cyclosilicate   10 g Oral TID    BMET    Component Value Date/Time   NA 136 09/29/2023 1515   NA 138 08/22/2014 0447   K 4.5 09/29/2023  1515   K 4.0 08/22/2014 0447   CL 96 (L) 09/29/2023 1515   CL 100 08/22/2014 0447   CO2 24 09/29/2023 1515   CO2 30 08/22/2014 0447   GLUCOSE 205 (H) 09/29/2023 1515   GLUCOSE 88 08/22/2014 0447   BUN 42 (H) 09/29/2023 1515   BUN 14 08/22/2014 0447   CREATININE 6.33 (H) 09/29/2023 1515   CREATININE 5.00 (H) 08/22/2014 0447   CALCIUM  9.0 09/29/2023 1515   CALCIUM  8.2 (L) 08/22/2014 0447   GFRNONAA 10 (L) 09/29/2023 1515   GFRNONAA 14 (L) 08/22/2014 0447   GFRNONAA 5 (L) 06/19/2014 0937   GFRAA 7 (L) 05/30/2020 1923   GFRAA 17 (L) 08/22/2014 0447   GFRAA 6 (L) 06/19/2014 0937   CBC    Component Value Date/Time   WBC 5.5 09/29/2023 1515   RBC 2.78 (L) 09/29/2023 1515   HGB 7.9 (L) 09/29/2023 1515   HGB 9.7 (L) 08/22/2014 0447   HCT 25.9 (L) 09/29/2023 1515   HCT 30.0 (L) 08/22/2014 0447   PLT 185 09/29/2023 1515   PLT 158 08/22/2014 0447   MCV 93.2 09/29/2023 1515   MCV 97 08/22/2014 0447   MCH  28.4 09/29/2023 1515   MCHC 30.5 09/29/2023 1515   RDW 15.6 (H) 09/29/2023 1515   RDW 15.3 (H) 08/22/2014 0447   LYMPHSABS 1.1 09/27/2023 0250   LYMPHSABS 1.7 08/22/2014 0447   MONOABS 0.6 09/27/2023 0250   MONOABS 0.8 08/22/2014 0447   EOSABS 0.6 (H) 09/27/2023 0250   EOSABS 0.4 08/22/2014 0447   BASOSABS 0.0 09/27/2023 0250   BASOSABS 0.1 08/22/2014 0447    OP HD: [facility] MWF  4h   400/1.5  77kg  L fem TDC 2/3 bath  Heparin  none   Assessment/ Plan: 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 Not a candidate for surgery here. SP line holiday w/ new TDC placed 10/18. Continues on IV vanc upon return from [place] . Adjusted plan per ID pharm is 750mg  MWF through 10/27/23. Hyperkalemia - Pt not adhering to renal diet though sign on door with specific instructions for dietary.  Lokelma  has been increased to BID, remains high increase to TID.  Follow daily labs - K mid 4s now, cont TID lokelma .  SP I&D brain abscess: drained 12/04 at [facility]. Mentation improved. Acute bilat CVA: suspected consequence of septic emboli  ESRD: on HD MWF - BP has been on low side, will add pre HD midodrine .  Volume: on RA, L arm + edema, no other edema. 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  Needs to elevate arm Right arm swelling and pain - likely due to IV infiltration.  Will need to remove and follow for infection.  Anemia: Hb 8-9s, on ESA. Hold iron  infusion while on IV ABX. 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 Cont binders w/ meals.  Hypertension: BP on low side recently, on low dose BB w h/o atrial arrhythmia.   HD access:  L  femoral TDC placed on 10/18. 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 Type 1 DM: per primary Dispo - tolerated full HD 12/27 in chair. Cont efforts to dialyze in chair.  Appears SNF being pursued and has 2 options in [place] and 1 in [place].  He is leaning towards [facility].   SW assisting.   Fairy RONAL Sellar, MD [employer] [phone]

## 2023-10-01 NOTE — TOC Progression Note (Signed)
 Transition of Care [facility]) - Progression Note    Patient Details  Name: [name] MRN: [id number] Date of Birth: August 07, 1981  Transition of Care [facility]) CM/SW Contact  [name], [name] Phone Number: 10/01/2023, 2:03 PM  Clinical Narrative:     CSW met with pt to discuss SNF choice. He states he was not able to speak with his mom about facilities yesterday and that he will speak with her when she visits this evening. Pt stated that his mom is available by phone and that she would call back if CSW left a message.   CSW called pt's mom and explained [facility] could not offer bed. CSW explained 3 SNF bed offers and their medicare ratings. Pt's mom chooses [facility].   CSW confirmed bed at [facility]. TOC CMA submitted SNF auth request. [name] is currently pending. Mzq#[id number]   Expected Discharge Plan: Skilled Nursing Facility Barriers to Discharge: Insurance Authorization                       Social Determinants of Health (SDOH) Interventions SDOH Screenings   Food Insecurity: No Food Insecurity (09/09/2023)  Housing: Unknown (09/09/2023)  Transportation Needs: No Transportation Needs (09/09/2023)  Utilities: Not At Risk (09/09/2023)  Alcohol Screen: Low Risk  (02/15/2023)  Depression (PHQ2-9): Low Risk  (02/15/2023)  Financial Resource Strain: Low Risk  (02/15/2023)  Physical Activity: Inactive (02/15/2023)  Social Connections: Socially Integrated (02/15/2023)  Stress: No Stress Concern Present (02/15/2023)  Tobacco Use: Low Risk  (09/12/2023)  Recent Concern: Tobacco Use - Medium Risk (09/01/2023)   Received from [facility]    Readmission Risk Interventions     No data to display

## 2023-10-01 NOTE — Progress Notes (Signed)
 PROGRESS NOTE    [name]  FMW:[id number] DOB: 1980-10-24 DOA: 09/09/2023 PCP: Celestia Rosaline SQUIBB, NP   Brief Narrative: [name] is a 43 y.o. 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  While at [facility] he underwent cardiac cath showing non obstructive disease, craniotomy with abscess drainage on 09/01/23,. He had subarachnoid hemorrhage while on IV heparin , right third toe distal necrosis,was seen by podiatry, but family wanted conservative management.  Since his tertiary care needs have been completed, he was transferred back to [facility] for further management.  PTOT  recommending SNF and awaiting placement.   Assessment and Plan:  MRSA TV/MV endocarditis Positive MRSA blood cultures. Diagnosed on Transesophageal Echocardiogram. Patient managed on Vancomycin . At outside hospital, patient underwent a left heart catheterization which was significant for nonobstructive disease. Infectious disease recommendations to continue Vancomycin  IV to complete 8 weeks of treatment with an end date of 1/29. Recommendation for outpatient ID follow-up with appointment scheduled for 10/12/2023.  Prostate fluid collection/abscess Urology consulted at outside hospital and per their assessment, fluid collection was secondary to dilated seminal vesicle with no surgical intervention recommended. Infectious disease with continued concern for possible abscess. Patient covered with vancomycin  with recommendation for outpatient urology follow-up.  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 Patient evaluated by neurosurgery. Concern for abscess ruled out by neurosurgery initially. No surgical intervention per neurosurgery. However, at outside hospital, patient was evaluated by neurosurgery who performed a craniotomy for abscess drainage.  ESRD on HD Nephrology consulted and managing hemodialysis.  Hyperkalemia Improved with HD.  Anemia of chronic kidney disease Hemoglobin stable.  CKD mineral bone disease -Continue calcitriol   Acute CVA MRI confirms large right MCA branch infarct. MRA head (11/25) significant for large necrotic appearing lesion within the right frontal lobe within area of the prior right MCA territory infarct with vasogenic edema and 8 mm leftward midline shift. LDL of 34. Hemoglobin A1C of 8.9%. Transthoracic Echocardiogram (10/13) significant for an LVEF of 25-30% with concern for AV/MV abnormality with Transesophageal Echocardiogram (10/16 & 10/22) significant for MV/TV vegetations. Neurology recommendations for Eliquis . PT/OT recommendations for SNF.  Primary hypertension Patient is on metoprolol  as an outpatient. -Continue metoprolol   Acute left upper extremity DVT -Continue Eliquis   Right third toe necrosis Noted. Per documentation, patient/family opting for conservative management and decline surgical management.  Diabetes mellitus type 1 Poorly controlled with hyperglycemia. Hemoglobin A1C of 8.9%. Partly due to poor dietary choice. Complicated by intermittent hypoglycemia, as well. -Continue Semglee  4 units and SSI  Gastroparesis Noted. Patient is not on medication management apart from insulin  for blood sugar control. Patient without symptoms and is tolerating his diet.  History of left BKA Noted. Patient seen by PT/OT with recommendation for SNF on discharge.  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 Patient started on Seroquel  for behavioral change management.  Overweight Estimated body mass index is 29.52  kg/m as calculated from the following:   Height as of this encounter: 5' 7 (1.702 m).   Weight as of this encounter: 85.5 kg.  Pressure injury Left buttock. Not present on admission.   DVT  prophylaxis: Eliquis  Code Status:   Code Status: Full Code Family Communication: None at bedside Disposition Plan: Discharge to SNF pending bed availability   Consultants:    Procedures:    Antimicrobials: Vancomycin  Linezolid     Subjective: No medical questions or concerns.  Objective: BP (!) 151/105 (BP Location: Right Leg) Comment (BP Location): per pt request  Pulse (!) 110   Temp (!) 97.4 F (36.3 C) (Oral)   Resp 16   Ht 5' 7 (1.702 m)   Wt 85.5 kg   SpO2 98%   BMI 29.52 kg/m   Examination:  General exam: Appears calm and comfortable Respiratory system: Respiratory effort normal. Central nervous system: Alert and oriented. Musculoskeletal: Left AKA Psychiatry: Judgement and insight appear normal. Mood & affect appropriate.    Data Reviewed: I have personally reviewed following labs and imaging studies  CBC Lab Results  Component Value Date   WBC 6.2 10/01/2023   RBC 2.62 (L) 10/01/2023   HGB 7.4 (L) 10/01/2023   HCT 24.6 (L) 10/01/2023   MCV 93.9 10/01/2023   MCH 28.2 10/01/2023   PLT 190 10/01/2023   MCHC 30.1 10/01/2023   RDW 15.8 (H) 10/01/2023   LYMPHSABS 1.1 09/27/2023   MONOABS 0.6 09/27/2023   EOSABS 0.6 (H) 09/27/2023   BASOSABS 0.0 09/27/2023     Last metabolic panel Lab Results  Component Value Date   NA 138 10/01/2023   K 4.8 10/01/2023   CL 96 (L) 10/01/2023   CO2 24 10/01/2023   BUN 35 (H) 10/01/2023   CREATININE 6.38 (H) 10/01/2023   GLUCOSE 199 (H) 10/01/2023   GFRNONAA 10 (L) 10/01/2023   GFRAA 7 (L) 05/30/2020   CALCIUM  9.2 10/01/2023   PHOS 8.4 (H) 10/01/2023   PROT 7.6 09/09/2023   ALBUMIN  2.4 (L) 10/01/2023   BILITOT 0.8 09/09/2023   ALKPHOS 116 09/09/2023   AST 15 09/09/2023   ALT 12 09/09/2023   ANIONGAP 18 (H)  10/01/2023    GFR: Estimated Creatinine Clearance: 15.4 mL/min (A) (by C-G formula based on SCr of 6.38 mg/dL (H)).  No results found for this or any previous visit (from the past 240 hours).    Radiology Studies: No results found.    LOS: 22 days    Elgin Lam, MD [facility] 10/01/2023, 2:18 PM   If 7PM-7AM, please contact night-coverage [email or web address]

## 2023-10-01 NOTE — Progress Notes (Signed)
 Physical Therapy Treatment Patient Details Name: [name] MRN: [id number] DOB: Mar 21, 1981 Today's Date: 10/01/2023   History of Present Illness 43 y.o. male presents to [facility] 09/09/23 as a transfer from [facility] for further management of endocarditis. Pt originally was admitted to [facility] 07/09/23 for DKA and sepsis after being found on the ground. 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 10/17 pt had a R MCA, L occipital, and B cerebellum CVA w/ septic emboli. ICU admit 10/18 w/ intubation 10/18-10/24. Pt was transferred to [facility] for MV repair on 11/29. At [facility], pt had cardiac cath w/ non obstructive disease, SAH, a craniotomy w/ abscess drainage on 12/4, and R third toe distal necrosis. PMH: chronic L AKA, MRSA, IDDM, ESRD on HD MWF, HTN, TEE positive for vegetation on multiple valves, cellulitis, medical noncompliance, narcotic dependence, DM with gastroparesis, L internal jugular DVT.    PT Comments  Patient resting in bed and reports waiting on transport for HD but agreeable to mobilize with therapy prior to dialysis. Pt required cues for sequencing bed mobility to sit up to Rt side of bed. Pt required min assist to guide Rt LE and Max assist for raising trunk and pivoting hips to sit upright. Pt with increased posterior lean today and Rt hip scooted too far anteriorly required Max +2 to boost posteriorly to increase seated stability. Attempted anterior weight shifts of trunk with cues for Rt hand to Rt knee and mod-max assist provided to facilitate ant lean. EOS returned to supine and repositioned for comfort with blankets and Lt UE elevated on pillow. Will continue to progress pt as able during acute stay. Patient will benefit from continued inpatient follow up therapy, <3 hours/day.   If plan is discharge home, recommend the following: Two people to help with walking and/or transfers;Assist for transportation;Supervision due to cognitive status;Help with  stairs or ramp for entrance;Two people to help with bathing/dressing/bathroom;Assistance with cooking/housework;Direct supervision/assist for financial management;Direct supervision/assist for medications management   Can travel by private vehicle     No  Equipment Recommendations  Hoyer lift;Wheelchair (measurements PT);Wheelchair cushion (measurements PT);Other (comment) (roho cushion; would be good to have wheelchair with auto-pressure relief function and one-arm drive vs electric wheelchair pending progress)    Recommendations for Other Services Rehab consult     Precautions / Restrictions Precautions Precautions: Fall Precaution Comments: Contact precs; L AKA, L hemiparesis and edema, L neglect, bowel incontinence Restrictions Weight Bearing Restrictions Per Provider Order: No LLE Weight Bearing Per Provider Order: Weight bearing as tolerated Other Position/Activity Restrictions: Pt has sling for LUE when mobilizing OOB; LLE prosthetic not fitting     Mobility  Bed Mobility Overal bed mobility: Needs Assistance Bed Mobility: Rolling, Sit to Supine, Sidelying to Sit Rolling: Max assist Sidelying to sit: Max assist, +2 for safety/equipment, HOB elevated, Used rails   Sit to supine: +2 for physical assistance, +2 for safety/equipment, Total assist   General bed mobility comments: Max assist for rolling with cues to bring Rt LE of EOB and max assist with bed pad to pivot/scoot hip for turn to Rt and to raise trunk to EOB. pt required posterior support to prevent lean and cues to facilitate anterior trunk weight shift for upright sitting balance. Max +2 to boost Rt hip posteriorly at EOB to improve seated stability. Total +2 to return to supine and boost in bed.    Transfers  General transfer comment: returned to supine for transport to HD in bed.    Ambulation/Gait                   Stairs             Wheelchair Mobility     Tilt  Bed    Modified Rankin (Stroke Patients Only)       Balance Overall balance assessment: Needs assistance Sitting-balance support: Single extremity supported, Feet supported Sitting balance-[name]: Poor Sitting balance - Comments: required mod to maxA assist for sitting balance on EOB despite single UE support, pt with lean toward his weaker L side throughout sitting Postural control: Posterior lean, Left lateral lean                                  Cognition Arousal: Alert Behavior During Therapy: Flat affect Overall Cognitive Status: Impaired/Different from baseline Area of Impairment: Attention, Memory, Following commands, Safety/judgement, Awareness, Problem solving                   Current Attention Level: Focused Memory: Decreased short-term memory Following Commands: Follows one step commands with increased time Safety/Judgement: Decreased awareness of safety, Decreased awareness of deficits Awareness: Intellectual Problem Solving: Slow processing, Difficulty sequencing, Requires verbal cues, Requires tactile cues, Decreased initiation General Comments: pt very talkative and engaged therapist about different cars he has owned and his family and strong faith.        Exercises      General Comments        Pertinent Vitals/Pain Pain Assessment Pain Assessment: Faces Faces Pain Scale: Hurts a little bit Pain Location: generalized Pain Descriptors / Indicators: Grimacing, Discomfort Pain Intervention(s): Limited activity within patient's tolerance, Monitored during session, Repositioned    Home Living                          Prior Function            PT Goals (current goals can now be found in the care plan section) Acute Rehab PT Goals Patient Stated Goal: to get more therapy and gain independence PT Goal Formulation: With patient Time For Goal Achievement: 10/12/23 (extended x2 weeks) Potential to Achieve Goals:  Fair Progress towards PT goals: Progressing toward goals    Frequency    Min 1X/week      PT Plan      Co-evaluation              AM-PAC PT 6 Clicks Mobility   Outcome Measure  Help needed turning from your back to your side while in a flat bed without using bedrails?: A Lot Help needed moving from lying on your back to sitting on the side of a flat bed without using bedrails?: Total Help needed moving to and from a bed to a chair (including a wheelchair)?: Total Help needed standing up from a chair using your arms (e.g., wheelchair or bedside chair)?: Total Help needed to walk in hospital room?: Total Help needed climbing 3-5 steps with a railing? : Total 6 Click Score: 7    End of Session Equipment Utilized During Treatment: Gait belt (Lt UE sling) Activity Tolerance: Patient tolerated treatment well Patient left: in bed;with call bell/phone within reach;with bed alarm set Nurse Communication: Mobility status;Need for lift equipment PT Visit Diagnosis: Other abnormalities of gait and mobility (R26.89);Other symptoms  and signs involving the nervous system (R29.898);Hemiplegia and hemiparesis Hemiplegia - Right/Left: Left Hemiplegia - dominant/non-dominant: Non-dominant Hemiplegia - caused by: Cerebral infarction     Time: 8661-8583 PT Time Calculation (min) (ACUTE ONLY): 38 min  Charges:    $Therapeutic Activity: 23-37 mins PT General Charges $$ ACUTE PT VISIT: 1 Visit                     Vernell DONEEN KLEIN, DPT Acute Rehabilitation Services Office [phone]  10/01/23 5:15 PM

## 2023-10-01 NOTE — Consult Note (Addendum)
 Value-Based Care [facility] Liaison Consult Note    10/01/2023  [name] 02/19/1981 [id number]  Insurance: [employer]    Primary Care Provider: Celestia Rosaline SQUIBB, NP, [facility],  this provider is listed for the transition of care follow up appointments  and [facility] calls   [facility] Liaison rounding on patient with 21 days noted as patient was transferred to [facility] from [facility] on 09/09/23 noted   The patient was screened for 30 day readmission hospitalization with noted extreme risk score for unplanned readmission risk 3 hospital admissions in 6 months.  The patient was assessed for potential Community Care Coordination service needs for post hospital transition for care coordination. Review of patient's electronic medical record reveals patient is being recommended for a SNF level of care.   2:40 pm Patient is currently of the unit. Patient is also on contact precautions noted.  Plan: [facility] Liaison will continue to follow progress and disposition to asess for post hospital community care coordination/management needs.  Referral request for community care coordination: Barriers - continue to follow, ongoing SNF bed search and with insurance auth and need HD scheduled coordinator   VBCI Community Care, Population Health does not replace or interfere with any arrangements made by the Inpatient Transition of Care team.   For questions contact:   Richerd Fish, RN, BSN, CCM [place]  [facility], [facility] Liaison Direct Dial : [phone] or secure chat Email: [email or web address]

## 2023-10-02 DIAGNOSIS — Z992 Dependence on renal dialysis: Secondary | ICD-10-CM | POA: Diagnosis not present

## 2023-10-02 DIAGNOSIS — I38 Endocarditis, valve unspecified: Secondary | ICD-10-CM | POA: Diagnosis not present

## 2023-10-02 DIAGNOSIS — E1022 Type 1 diabetes mellitus with diabetic chronic kidney disease: Secondary | ICD-10-CM | POA: Diagnosis not present

## 2023-10-02 DIAGNOSIS — I059 Rheumatic mitral valve disease, unspecified: Secondary | ICD-10-CM | POA: Diagnosis not present

## 2023-10-02 DIAGNOSIS — N186 End stage renal disease: Secondary | ICD-10-CM | POA: Diagnosis not present

## 2023-10-02 DIAGNOSIS — R7881 Bacteremia: Secondary | ICD-10-CM | POA: Diagnosis not present

## 2023-10-02 DIAGNOSIS — N250 Renal osteodystrophy: Secondary | ICD-10-CM | POA: Diagnosis not present

## 2023-10-02 DIAGNOSIS — I079 Rheumatic tricuspid valve disease, unspecified: Secondary | ICD-10-CM | POA: Diagnosis not present

## 2023-10-02 DIAGNOSIS — B9562 Methicillin resistant Staphylococcus aureus infection as the cause of diseases classified elsewhere: Secondary | ICD-10-CM | POA: Diagnosis not present

## 2023-10-02 DIAGNOSIS — D631 Anemia in chronic kidney disease: Secondary | ICD-10-CM | POA: Diagnosis not present

## 2023-10-02 DIAGNOSIS — I120 Hypertensive chronic kidney disease with stage 5 chronic kidney disease or end stage renal disease: Secondary | ICD-10-CM | POA: Diagnosis not present

## 2023-10-02 LAB — GLUCOSE, CAPILLARY
Glucose-Capillary: 201 mg/dL — ABNORMAL HIGH (ref 70–99)
Glucose-Capillary: 171 mg/dL — ABNORMAL HIGH (ref 70–99)
Glucose-Capillary: 282 mg/dL — ABNORMAL HIGH (ref 70–99)
Glucose-Capillary: 237 mg/dL — ABNORMAL HIGH (ref 70–99)

## 2023-10-02 LAB — CBC
HCT: 26.3 % — ABNORMAL LOW (ref 39.0–52.0)
Hemoglobin: 7.9 g/dL — ABNORMAL LOW (ref 13.0–17.0)
MCH: 28.1 pg (ref 26.0–34.0)
MCHC: 30 g/dL (ref 30.0–36.0)
MCV: 93.6 fL (ref 80.0–100.0)
Platelets: 206 10*3/uL (ref 150–400)
RBC: 2.81 MIL/uL — ABNORMAL LOW (ref 4.22–5.81)
RDW: 15.6 % — ABNORMAL HIGH (ref 11.5–15.5)
WBC: 6.6 10*3/uL (ref 4.0–10.5)
nRBC: 0 % (ref 0.0–0.2)

## 2023-10-02 NOTE — Progress Notes (Signed)
 Patient ID: [name], male   DOB: 12-Jul-1981, 43 y.o.   MRN: [id number] S: No new complaints O:BP (!) 92/57 (BP Location: Right Leg)   Pulse (!) 112   Temp 97.6 F (36.4 C) (Oral)   Resp 16   Ht 5' 7 (1.702 m)   Wt 99 kg   SpO2 97%   BMI 34.18 kg/m   Intake/Output Summary (Last 24 hours) at 10/02/2023 1017 Last data filed at 10/01/2023 1840 Gross per 24 hour  Intake --  Output 400 ml  Net -400 ml   Intake/Output: I/O last 3 completed shifts: In: 360 [P.O.:360] Out: 400 [Other:400]  Intake/Output this shift:  No intake/output data recorded. Weight change:  Gen: NAD CVS: tachy Resp: CTA Abd: +BS, soft, TN/Nd Ext: no edema, s/p LAKA  Recent Labs  Lab 09/26/23 0232 09/27/23 0434 09/29/23 1515 10/01/23 1205  NA 136 134* 136 138  K 4.7 5.2* 4.5 4.8  CL 98 94* 96* 96*  CO2 25 23 24 24   GLUCOSE 266* 290* 205* 199*  BUN 42* 48* 42* 35*  CREATININE 6.39* 7.33* 6.33* 6.38*  ALBUMIN  2.6*  --  2.6* 2.4*  CALCIUM  9.2 8.9 9.0 9.2  PHOS 8.1*  --  7.8* 8.4*   Liver Function Tests: Recent Labs  Lab 09/26/23 0232 09/29/23 1515 10/01/23 1205  ALBUMIN  2.6* 2.6* 2.4*   No results for input(s): LIPASE, AMYLASE in the last 168 hours. No results for input(s): AMMONIA in the last 168 hours. CBC: Recent Labs  Lab 09/26/23 0231 09/27/23 0250 09/29/23 1515 10/01/23 1205 10/02/23 0901  WBC 6.4 8.7 5.5 6.2 6.6  NEUTROABS 4.3 6.3  --   --   --   HGB 7.4* 8.9* 7.9* 7.4* 7.9*  HCT 23.7* 29.2* 25.9* 24.6* 26.3*  MCV 92.2 93.6 93.2 93.9 93.6  PLT 168 153 185 190 206   Cardiac Enzymes: No results for input(s): CKTOTAL, CKMB, CKMBINDEX, TROPONINI in the last 168 hours. CBG: Recent Labs  Lab 09/30/23 2118 10/01/23 0537 10/01/23 1157 10/01/23 2202 10/02/23 0529  GLUCAP 434* 167* 200* 264* 201*    Iron  Studies: No results for input(s): IRON , TIBC, TRANSFERRIN, FERRITIN in the last 72 hours. Studies/Results: No results found.  apixaban   5 mg  Oral BID   bacitracin    Topical Daily   calcitRIOL   1.5 mcg Oral Q M,W,F-HD   Chlorhexidine  Gluconate Cloth  6 each Topical Q0600   darbepoetin (ARANESP ) injection - DIALYSIS  100 mcg Subcutaneous Q Sun-1800   gabapentin   100 mg Oral TID   Gerhardt's butt cream   Topical BID   heparin  sodium (porcine)  4,600 Units Intracatheter Once   influenza vac split trivalent PF  0.5 mL Intramuscular Tomorrow-1000   insulin  aspart  0-6 Units Subcutaneous TID AC & HS   insulin  glargine-yfgn  4 Units Subcutaneous Daily   loratadine   10 mg Oral Daily   metoprolol  tartrate  25 mg Oral BID   midodrine   10 mg Oral Q M,W,F-HD   pantoprazole   40 mg Oral Daily   povidone-Iodine    Topical Q0600   QUEtiapine   12.5 mg Oral q morning   QUEtiapine   25 mg Oral QHS   senna-docusate  2 tablet Oral BID   sevelamer  carbonate  1,600 mg Oral TID WC   sodium zirconium cyclosilicate   10 g Oral TID    BMET    Component Value Date/Time   NA 138 10/01/2023 1205   NA 138 08/22/2014 0447   K 4.8  10/01/2023 1205   K 4.0 08/22/2014 0447   CL 96 (L) 10/01/2023 1205   CL 100 08/22/2014 0447   CO2 24 10/01/2023 1205   CO2 30 08/22/2014 0447   GLUCOSE 199 (H) 10/01/2023 1205   GLUCOSE 88 08/22/2014 0447   BUN 35 (H) 10/01/2023 1205   BUN 14 08/22/2014 0447   CREATININE 6.38 (H) 10/01/2023 1205   CREATININE 5.00 (H) 08/22/2014 0447   CALCIUM  9.2 10/01/2023 1205   CALCIUM  8.2 (L) 08/22/2014 0447   GFRNONAA 10 (L) 10/01/2023 1205   GFRNONAA 14 (L) 08/22/2014 0447   GFRNONAA 5 (L) 06/19/2014 0937   GFRAA 7 (L) 05/30/2020 1923   GFRAA 17 (L) 08/22/2014 0447   GFRAA 6 (L) 06/19/2014 0937   CBC    Component Value Date/Time   WBC 6.6 10/02/2023 0901   RBC 2.81 (L) 10/02/2023 0901   HGB 7.9 (L) 10/02/2023 0901   HGB 9.7 (L) 08/22/2014 0447   HCT 26.3 (L) 10/02/2023 0901   HCT 30.0 (L) 08/22/2014 0447   PLT 206 10/02/2023 0901   PLT 158 08/22/2014 0447   MCV 93.6 10/02/2023 0901   MCV 97 08/22/2014 0447   MCH  28.1 10/02/2023 0901   MCHC 30.0 10/02/2023 0901   RDW 15.6 (H) 10/02/2023 0901   RDW 15.3 (H) 08/22/2014 0447   LYMPHSABS 1.1 09/27/2023 0250   LYMPHSABS 1.7 08/22/2014 0447   MONOABS 0.6 09/27/2023 0250   MONOABS 0.8 08/22/2014 0447   EOSABS 0.6 (H) 09/27/2023 0250   EOSABS 0.4 08/22/2014 0447   BASOSABS 0.0 09/27/2023 0250   BASOSABS 0.1 08/22/2014 0447     OP HD: [facility] MWF  4h   400/1.5  77kg  L fem TDC 2/3 bath  Heparin  none   Assessment/ Plan: 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 Not a candidate for surgery here. SP line holiday w/ new TDC placed 10/18. Continues on IV vanc upon return from [place] . Adjusted plan per ID pharm is 750mg  MWF through 10/27/23. Hyperkalemia - Pt not adhering to renal diet though sign on door with specific instructions for dietary.  Lokelma  has been increased to BID, remains high increase to TID.  Follow daily labs - K mid 4s now, cont TID lokelma .  SP I&D brain abscess: drained 12/04 at [facility]. Mentation improved. Acute bilat CVA: suspected consequence of septic emboli  ESRD: on HD MWF - BP has been on low side, will add pre HD midodrine .  Volume: on RA, L arm + edema, no other edema. 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  Needs to elevate arm Right arm swelling and pain - likely due to IV infiltration.  Will need to remove and follow for infection.  Anemia: Hb 8-9s, on ESA. Hold iron  infusion while on IV ABX. 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 Cont binders w/ meals.  Hypertension: BP on low side recently, on low dose BB w h/o atrial arrhythmia.   HD access:  L  femoral TDC placed on 10/18. 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 Type 1 DM: per primary Dispo - tolerated full HD 12/27 in chair. Cont efforts to dialyze in chair.  Appears SNF being pursued and has 2 options in [place] and 1 in [place].  He is leaning [facility].  SW assisting.   Fairy RONAL Sellar, MD [employer] [phone]

## 2023-10-02 NOTE — TOC Progression Note (Signed)
 Transition of Care [facility]) - Progression Note    Patient Details  Name: [name] MRN: [id number] Date of Birth: Feb 18, 1981  Transition of Care [facility]) CM/SW Contact  Arlana JINNY Nicholaus ISRAEL Phone Number: [phone] 10/02/2023, 3:52 PM  Clinical Narrative:   CSW called and spoke with the pts mother to confirm [facility]. According to the CSW last note [name] is still pending for [employer]. CSW will will followm up with [employer] ins to confirm if is still pending or approved/denied.   TOC will continue following.     Expected Discharge Plan: Skilled Nursing Facility Barriers to Discharge: Insurance Authorization  Expected Discharge Plan and Services                                               Social Determinants of Health (SDOH) Interventions SDOH Screenings   Food Insecurity: No Food Insecurity (09/09/2023)  Housing: Unknown (09/09/2023)  Transportation Needs: No Transportation Needs (09/09/2023)  Utilities: Not At Risk (09/09/2023)  Alcohol Screen: Low Risk  (02/15/2023)  Depression (PHQ2-9): Low Risk  (02/15/2023)  Financial Resource Strain: Low Risk  (02/15/2023)  Physical Activity: Inactive (02/15/2023)  Social Connections: Socially Integrated (02/15/2023)  Stress: No Stress Concern Present (02/15/2023)  Tobacco Use: Low Risk  (09/12/2023)  Recent Concern: Tobacco Use - Medium Risk (09/01/2023)   Received from [facility]    Readmission Risk Interventions     No data to display

## 2023-10-02 NOTE — Plan of Care (Signed)
   Problem: Education: Goal: Knowledge of General Education information will improve Description Including pain rating scale, medication(s)/side effects and non-pharmacologic comfort measures Outcome: Progressing   Problem: Health Behavior/Discharge Planning: Goal: Ability to manage health-related needs will improve Outcome: Progressing

## 2023-10-02 NOTE — Progress Notes (Signed)
 PROGRESS NOTE    [name]  FMW:[id number] DOB: Apr 10, 1981 DOA: 09/09/2023 PCP: Celestia Rosaline SQUIBB, NP   Brief Narrative: [name] is a 43 y.o. 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  While at [facility] he underwent cardiac cath showing non obstructive disease, craniotomy with abscess drainage on 09/01/23,. He had subarachnoid hemorrhage while on IV heparin , right third toe distal necrosis,was seen by podiatry, but family wanted conservative management.  Since his tertiary care needs have been completed, he was transferred back to [facility] for further management.  PTOT  recommending SNF and awaiting placement.   Assessment and Plan:  MRSA TV/MV endocarditis Positive MRSA blood cultures. Diagnosed on Transesophageal Echocardiogram. Patient managed on Vancomycin . At outside hospital, patient underwent a left heart catheterization which was significant for nonobstructive disease. Infectious disease recommendations to continue Vancomycin  IV to complete 8 weeks of treatment with an end date of 1/29. Recommendation for outpatient ID follow-up with appointment scheduled for 10/12/2023.  Prostate fluid collection/abscess Urology consulted at outside hospital and per their assessment, fluid collection was secondary to dilated seminal vesicle with no surgical intervention recommended. Infectious disease with continued concern for possible abscess. Patient covered with vancomycin  with recommendation for outpatient urology follow-up.  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 Patient evaluated by neurosurgery. Concern for abscess ruled out by neurosurgery initially. No surgical intervention per neurosurgery. However, at outside hospital, patient was evaluated by neurosurgery who performed a craniotomy for abscess drainage.  ESRD on HD Nephrology consulted and managing hemodialysis.  Hyperkalemia Improved with HD.  Anemia of chronic kidney disease Hemoglobin stable.  CKD mineral bone disease -Continue calcitriol   Acute CVA MRI confirms large right MCA branch infarct. MRA head (11/25) significant for large necrotic appearing lesion within the right frontal lobe within area of the prior right MCA territory infarct with vasogenic edema and 8 mm leftward midline shift. LDL of 34. Hemoglobin A1C of 8.9%. Transthoracic Echocardiogram (10/13) significant for an LVEF of 25-30% with concern for AV/MV abnormality with Transesophageal Echocardiogram (10/16 & 10/22) significant for MV/TV vegetations. Neurology recommendations for Eliquis . PT/OT recommendations for SNF.  Primary hypertension Patient is on metoprolol  as an outpatient. -Continue metoprolol   Acute left upper extremity DVT -Continue Eliquis   Right third toe necrosis Noted. Per documentation, patient/family opting for conservative management and decline surgical management.  Diabetes mellitus type 1 Poorly controlled with hyperglycemia. Hemoglobin A1C of 8.9%. Partly due to poor dietary choice. Complicated by intermittent hypoglycemia, as well. -Continue Semglee  4 units and SSI  Gastroparesis Noted. Patient is not on medication management apart from insulin  for blood sugar control. Patient without symptoms and is tolerating his diet.  History of left BKA Noted. Patient seen by PT/OT with recommendation for SNF on discharge.  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 Patient started on Seroquel  for behavioral change management.  Overweight Estimated body mass index is 34.18  kg/m as calculated from the following:   Height as of this encounter: 5' 7 (1.702 m).   Weight as of this encounter: 99 kg.  Pressure injury Left buttock. Not present on admission.   DVT  prophylaxis: Eliquis  Code Status:   Code Status: Full Code Family Communication: None at bedside Disposition Plan: Discharge to SNF pending bed availability   Consultants:    Procedures:    Antimicrobials: Vancomycin  Linezolid     Subjective: Patient is agitated. He is referring to staff outside of their name/title. Feels that he is entitled to speak to people in a derogatory manner because of their actions and  what he's been through.  Objective: BP (!) 92/57 (BP Location: Right Leg)   Pulse (!) 112   Temp 97.6 F (36.4 C) (Oral)   Resp 16   Ht 5' 7 (1.702 m)   Wt 99 kg   SpO2 97%   BMI 34.18 kg/m   Examination:  General exam: Agitated Respiratory system: Respiratory effort normal. Central nervous system: Alert and oriented.   Data Reviewed: I have personally reviewed following labs and imaging studies  CBC Lab Results  Component Value Date   WBC 6.6 10/02/2023   RBC 2.81 (L) 10/02/2023   HGB 7.9 (L) 10/02/2023   HCT 26.3 (L) 10/02/2023   MCV 93.6 10/02/2023   MCH 28.1 10/02/2023   PLT 206 10/02/2023   MCHC 30.0 10/02/2023   RDW 15.6 (H) 10/02/2023   LYMPHSABS 1.1 09/27/2023   MONOABS 0.6 09/27/2023   EOSABS 0.6 (H) 09/27/2023   BASOSABS 0.0 09/27/2023     Last metabolic panel Lab Results  Component Value Date   NA 138 10/01/2023   K 4.8 10/01/2023   CL 96 (L) 10/01/2023   CO2 24 10/01/2023   BUN 35 (H) 10/01/2023   CREATININE 6.38 (H) 10/01/2023   GLUCOSE 199 (H) 10/01/2023   GFRNONAA 10 (L) 10/01/2023   GFRAA 7 (L) 05/30/2020   CALCIUM  9.2 10/01/2023   PHOS 8.4 (H) 10/01/2023   PROT 7.6 09/09/2023   ALBUMIN  2.4 (L) 10/01/2023   BILITOT 0.8 09/09/2023   ALKPHOS 116 09/09/2023   AST 15 09/09/2023   ALT 12 09/09/2023   ANIONGAP 18 (H)  10/01/2023    GFR: Estimated Creatinine Clearance: 16.6 mL/min (A) (by C-G formula based on SCr of 6.38 mg/dL (H)).  No results found for this or any previous visit (from the past 240 hours).    Radiology Studies: No results found.    LOS: 23 days    Elgin Lam, MD [facility] 10/02/2023, 9:40 AM   If 7PM-7AM, please contact night-coverage [email or web address]

## 2023-10-03 DIAGNOSIS — N250 Renal osteodystrophy: Secondary | ICD-10-CM | POA: Diagnosis not present

## 2023-10-03 DIAGNOSIS — N186 End stage renal disease: Secondary | ICD-10-CM | POA: Diagnosis not present

## 2023-10-03 DIAGNOSIS — B9562 Methicillin resistant Staphylococcus aureus infection as the cause of diseases classified elsewhere: Secondary | ICD-10-CM | POA: Diagnosis not present

## 2023-10-03 DIAGNOSIS — I38 Endocarditis, valve unspecified: Secondary | ICD-10-CM | POA: Diagnosis not present

## 2023-10-03 DIAGNOSIS — I079 Rheumatic tricuspid valve disease, unspecified: Secondary | ICD-10-CM | POA: Diagnosis not present

## 2023-10-03 DIAGNOSIS — I059 Rheumatic mitral valve disease, unspecified: Secondary | ICD-10-CM | POA: Diagnosis not present

## 2023-10-03 DIAGNOSIS — Z992 Dependence on renal dialysis: Secondary | ICD-10-CM | POA: Diagnosis not present

## 2023-10-03 DIAGNOSIS — R7881 Bacteremia: Secondary | ICD-10-CM | POA: Diagnosis not present

## 2023-10-03 DIAGNOSIS — I120 Hypertensive chronic kidney disease with stage 5 chronic kidney disease or end stage renal disease: Secondary | ICD-10-CM | POA: Diagnosis not present

## 2023-10-03 DIAGNOSIS — D631 Anemia in chronic kidney disease: Secondary | ICD-10-CM | POA: Diagnosis not present

## 2023-10-03 DIAGNOSIS — E1022 Type 1 diabetes mellitus with diabetic chronic kidney disease: Secondary | ICD-10-CM | POA: Diagnosis not present

## 2023-10-03 LAB — GLUCOSE, CAPILLARY
Glucose-Capillary: 69 mg/dL — ABNORMAL LOW (ref 70–99)
Glucose-Capillary: 64 mg/dL — ABNORMAL LOW (ref 70–99)
Glucose-Capillary: 68 mg/dL — ABNORMAL LOW (ref 70–99)
Glucose-Capillary: 173 mg/dL — ABNORMAL HIGH (ref 70–99)
Glucose-Capillary: 212 mg/dL — ABNORMAL HIGH (ref 70–99)
Glucose-Capillary: 322 mg/dL — ABNORMAL HIGH (ref 70–99)
Glucose-Capillary: 293 mg/dL — ABNORMAL HIGH (ref 70–99)

## 2023-10-03 LAB — POTASSIUM: Potassium: 5.2 mmol/L — ABNORMAL HIGH (ref 3.5–5.1)

## 2023-10-03 MED ORDER — INSULIN GLARGINE-YFGN 100 UNIT/ML ~~LOC~~ SOLN: 5.0000 [IU] | Freq: Every day | SUBCUTANEOUS | Status: DC

## 2023-10-03 MED ORDER — INSULIN ASPART 100 UNIT/ML IJ SOLN
0.0000 [IU] | Freq: Three times a day (TID) | INTRAMUSCULAR | Status: DC
  Administered 2023-10-03: 1 [IU] via SUBCUTANEOUS

## 2023-10-03 MED ORDER — DEXTROSE 50 % IV SOLN
25.0000 mL | Freq: Once | INTRAVENOUS | Status: AC
  Administered 2023-10-21: 25 mL via INTRAVENOUS
  Filled 2023-10-03: qty 50

## 2023-10-03 MED ORDER — INSULIN ASPART 100 UNIT/ML IJ SOLN
0.0000 [IU] | Freq: Three times a day (TID) | INTRAMUSCULAR | Status: DC
  Administered 2023-10-03: 4 [IU] via SUBCUTANEOUS
  Administered 2023-10-04 (×3): 2 [IU] via SUBCUTANEOUS
  Administered 2023-10-05: 6 [IU] via SUBCUTANEOUS
  Administered 2023-10-05: 4 [IU] via SUBCUTANEOUS
  Administered 2023-10-05: 3 [IU] via SUBCUTANEOUS
  Administered 2023-10-06 – 2023-10-07 (×5): 4 [IU] via SUBCUTANEOUS
  Administered 2023-10-08: 1 [IU] via SUBCUTANEOUS
  Administered 2023-10-08: 2 [IU] via SUBCUTANEOUS
  Administered 2023-10-09: 1 [IU] via SUBCUTANEOUS
  Administered 2023-10-09: 3 [IU] via SUBCUTANEOUS
  Administered 2023-10-11: 1 [IU] via SUBCUTANEOUS
  Administered 2023-10-12: 6 [IU] via SUBCUTANEOUS
  Administered 2023-10-13 (×2): 2 [IU] via SUBCUTANEOUS
  Administered 2023-10-13: 1 [IU] via SUBCUTANEOUS
  Administered 2023-10-14: 6 [IU] via SUBCUTANEOUS
  Administered 2023-10-14: 5 [IU] via SUBCUTANEOUS
  Administered 2023-10-15 (×2): 4 [IU] via SUBCUTANEOUS
  Administered 2023-10-15: 2 [IU] via SUBCUTANEOUS

## 2023-10-03 MED ORDER — INSULIN GLARGINE-YFGN 100 UNIT/ML ~~LOC~~ SOLN
2.0000 [IU] | Freq: Every day | SUBCUTANEOUS | Status: DC
  Administered 2023-10-03 – 2023-10-05 (×3): 2 [IU] via SUBCUTANEOUS
  Filled 2023-10-03 (×4): qty 0.02

## 2023-10-03 MED ORDER — GLUCOSE 40 % PO GEL
1.0000 | Freq: Once | ORAL | Status: DC
  Filled 2023-10-03 (×2): qty 1.21

## 2023-10-03 NOTE — Progress Notes (Signed)
 6:20AM CBG was 69. MD notified and ordered 1/2 amp D50. IV was leaking was and RN tried to put an IV and it was unsuccessful. IV team consulted. MD informed and ordered oral glucose instead. Patient refused oral glucose and wants IV glucose only.

## 2023-10-03 NOTE — Plan of Care (Signed)
  Problem: Education: Goal: Knowledge of General Education information will improve Description: Including pain rating scale, medication(s)/side effects and non-pharmacologic comfort measures Outcome: Progressing   Problem: Clinical Measurements: Goal: Ability to maintain clinical measurements within normal limits will improve Outcome: Progressing   Problem: Clinical Measurements: Goal: Will remain free from infection Outcome: Progressing   Problem: Clinical Measurements: Goal: Diagnostic test results will improve Outcome: Progressing   Problem: Clinical Measurements: Goal: Cardiovascular complication will be avoided Outcome: Progressing   Problem: Activity: Goal: Risk for activity intolerance will decrease Outcome: Progressing

## 2023-10-03 NOTE — Significant Event (Signed)
 Patient was noticed to have CBG of 69 this morning asymptomatic.  Will hold patient's insulin glargine which is due at 10 AM.  I am ordering 1/2 ampoule of D50.  Recheck CBC in few minutes.  Midge Minium.

## 2023-10-03 NOTE — Plan of Care (Signed)
  Problem: Education: Goal: Knowledge of General Education information will improve Description: Including pain rating scale, medication(s)/side effects and non-pharmacologic comfort measures Outcome: Progressing   Problem: Health Behavior/Discharge Planning: Goal: Ability to manage health-related needs will improve Outcome: Not Progressing   Problem: Clinical Measurements: Goal: Ability to maintain clinical measurements within normal limits will improve Outcome: Progressing Goal: Will remain free from infection Outcome: Progressing Goal: Diagnostic test results will improve Outcome: Progressing Goal: Cardiovascular complication will be avoided Outcome: Progressing   Problem: Activity: Goal: Risk for activity intolerance will decrease Outcome: Not Progressing   Problem: Skin Integrity: Goal: Risk for impaired skin integrity will decrease Outcome: Not Progressing

## 2023-10-03 NOTE — Progress Notes (Addendum)
 PROGRESS NOTE    [name]  FMW:[id number] DOB: September 09, 1981 DOA: 09/09/2023 PCP: Celestia Rosaline SQUIBB, NP   Brief Narrative: [name] is a 43 y.o. male with medical history significant of ESRD on hemodialysis (not fully compliant), insulin -dependent type 1 diabetes, gastroparesis, Left AKA, admitted to [facility] on 07/09/23 for DKA, Sepsis and hospital course significant for MV AND TV  MRSA endocarditis, followed by acute MCA stroke , left occipital and bilateral cerebellar strokes consistent with septic emboli,  Brain abscess from MRSA, left IJ DVT was transferred to [facility] for MV repair on 11/29.  While at [facility] he underwent cardiac cath showing non obstructive disease, craniotomy with abscess drainage on 09/01/23,. He had subarachnoid hemorrhage while on IV heparin , right third toe distal necrosis,was seen by podiatry, but family wanted conservative management.  Since his tertiary care needs have been completed, he was transferred back to [facility] for further management.  PTOT  recommending SNF and awaiting placement.   Assessment and Plan:  MRSA TV/MV endocarditis Positive MRSA blood cultures. Diagnosed on Transesophageal Echocardiogram. Patient managed on Vancomycin . At outside hospital, patient underwent a left heart catheterization which was significant for nonobstructive disease. Infectious disease recommendations to continue Vancomycin  IV to complete 8 weeks of treatment with an end date of 1/29. Recommendation for outpatient ID follow-up with appointment scheduled for 10/12/2023.  Prostate fluid collection/abscess Urology consulted at outside hospital and per their assessment, fluid collection was secondary to dilated seminal vesicle with no surgical intervention recommended. Infectious disease with continued concern for possible abscess. Patient covered with vancomycin  with recommendation for outpatient urology follow-up.  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 Patient evaluated by neurosurgery. Concern for abscess ruled out by neurosurgery initially. No surgical intervention per neurosurgery. However, at outside hospital, patient was evaluated by neurosurgery who performed a craniotomy for abscess drainage.  ESRD on HD Nephrology consulted and managing hemodialysis.  Hyperkalemia Improved with HD.  Anemia of chronic kidney disease Hemoglobin stable.  CKD mineral bone disease -Continue calcitriol   Acute CVA MRI confirms large right MCA branch infarct. MRA head (11/25) significant for large necrotic appearing lesion within the right frontal lobe within area of the prior right MCA territory infarct with vasogenic edema and 8 mm leftward midline shift. LDL of 34. Hemoglobin A1C of 8.9%. Transthoracic Echocardiogram (10/13) significant for an LVEF of 25-30% with concern for AV/MV abnormality with Transesophageal Echocardiogram (10/16 & 10/22) significant for MV/TV vegetations. Neurology recommendations for Eliquis . PT/OT recommendations for SNF.  Primary hypertension Patient is on metoprolol  as an outpatient. -Continue metoprolol   Acute left upper extremity DVT -Continue Eliquis   Right third toe necrosis Noted. Per documentation, patient/family opting for conservative management and decline surgical management.  Diabetes mellitus type 1 Poorly controlled with hyperglycemia and hypoglycemia. Hemoglobin A1C of 8.9%. Partly due to poor dietary choice. -Decreased to Semglee  2 units daily and SSI; discontinue before bed dosing  Gastroparesis Noted. Patient is not on medication management apart from insulin  for blood sugar control. Patient without symptoms and is tolerating his diet.  History of left BKA Noted. Patient seen by PT/OT with recommendation for SNF on discharge.  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 Patient started on Seroquel  for behavioral change management.  Overweight Estimated body mass index is  30.87 kg/m as calculated from the following:   Height as of this encounter: 5' 7 (1.702 m).   Weight as of this encounter: 89.4 kg.  Pressure injury Left buttock. Not present on admission.  DVT prophylaxis: Eliquis  Code Status:   Code Status: Full Code Family Communication: None at bedside Disposition Plan: Discharge to SNF pending bed availability   Consultants:    Procedures:    Antimicrobials: Vancomycin  Linezolid     Subjective: Patient with hypoglycemia this morning with a low of 64.  Asymptomatic.  Mild improvement to 68 after breakfast.  Patient continues to be asymptomatic.  Objective: BP (!) 111/56 (BP Location: Left Leg)   Pulse (!) 101   Temp 97.6 F (36.4 C) (Oral)   Resp 17   Ht 5' 7 (1.702 m)   Wt 89.4 kg   SpO2 95%   BMI 30.87 kg/m   Examination:  General exam: Appears calm and comfortable Respiratory system: Respiratory effort normal. Gastrointestinal system: Abdomen is nondistended, soft and nontender. Central nervous system: Alert and oriented. No focal neurological deficits. Musculoskeletal: No edema. No calf tenderness. Left BKA   Data Reviewed: I have personally reviewed following labs and imaging studies  CBC Lab Results  Component Value Date   WBC 6.6 10/02/2023   RBC 2.81 (L) 10/02/2023   HGB 7.9 (L) 10/02/2023   HCT 26.3 (L) 10/02/2023   MCV 93.6 10/02/2023   MCH 28.1 10/02/2023   PLT 206 10/02/2023   MCHC 30.0 10/02/2023   RDW 15.6 (H) 10/02/2023   LYMPHSABS 1.1 09/27/2023   MONOABS 0.6 09/27/2023   EOSABS 0.6 (H) 09/27/2023   BASOSABS 0.0 09/27/2023     Last metabolic panel Lab Results  Component Value Date   NA 138 10/01/2023   K 4.8 10/01/2023   CL 96 (L) 10/01/2023   CO2 24 10/01/2023   BUN 35 (H) 10/01/2023   CREATININE 6.38 (H) 10/01/2023   GLUCOSE 199 (H) 10/01/2023   GFRNONAA 10 (L) 10/01/2023   GFRAA 7 (L) 05/30/2020   CALCIUM  9.2 10/01/2023   PHOS 8.4 (H) 10/01/2023   PROT 7.6 09/09/2023    ALBUMIN  2.4 (L) 10/01/2023   BILITOT 0.8 09/09/2023   ALKPHOS 116 09/09/2023   AST 15 09/09/2023   ALT 12 09/09/2023   ANIONGAP 18 (H) 10/01/2023    GFR: Estimated Creatinine Clearance: 15.8 mL/min (A) (by C-G formula based on SCr of 6.38 mg/dL (H)).  No results found for this or any previous visit (from the past 240 hours).    Radiology Studies: No results found.    LOS: 24 days    Elgin Lam, MD [facility] 10/03/2023, 10:04 AM   If 7PM-7AM, please contact night-coverage [email or web address]

## 2023-10-03 NOTE — Progress Notes (Signed)
 Patient ID: [name], male   DOB: [birth year], 43 y.o.   MRN: [id number] S: Pt noted to have low blood sugar this morning and IV team is trying to replace IV site.  O:BP (!) 111/56 (BP Location: Left Leg)   Pulse (!) 101   Temp 97.6 F (36.4 C) (Oral)   Resp 17   Ht 5' 7 (1.702 m)   Wt 89.4 kg   SpO2 95%   BMI 30.87 kg/m   Intake/Output Summary (Last 24 hours) at 10/03/2023 0923 Last data filed at 10/03/2023 0916 Gross per 24 hour  Intake 710 ml  Output --  Net 710 ml   Intake/Output: I/O last 3 completed shifts: In: 410 [P.O.:410] Out: -   Intake/Output this shift:  Total I/O In: 300 [P.O.:300] Out: -  Weight change: -10.1 kg Gen: NAD CVS: Tachy Resp: CTA Abd: +BS, soft, NT/ND Ext: s/p LAKA, 1+ LUE edema.  Recent Labs  Lab 09/27/23 0434 09/29/23 1515 10/01/23 1205  NA 134* 136 138  K 5.2* 4.5 4.8  CL 94* 96* 96*  CO2 23 24 24   GLUCOSE 290* 205* 199*  BUN 48* 42* 35*  CREATININE 7.33* 6.33* 6.38*  ALBUMIN   --  2.6* 2.4*  CALCIUM  8.9 9.0 9.2  PHOS  --  7.8* 8.4*   Liver Function Tests: Recent Labs  Lab 09/29/23 1515 10/01/23 1205  ALBUMIN  2.6* 2.4*   No results for input(s): LIPASE, AMYLASE in the last 168 hours. No results for input(s): AMMONIA in the last 168 hours. CBC: Recent Labs  Lab 09/27/23 0250 09/29/23 1515 10/01/23 1205 10/02/23 0901  WBC 8.7 5.5 6.2 6.6  NEUTROABS 6.3  --   --   --   HGB 8.9* 7.9* 7.4* 7.9*  HCT 29.2* 25.9* 24.6* 26.3*  MCV 93.6 93.2 93.9 93.6  PLT 153 185 190 206   Cardiac Enzymes: No results for input(s): CKTOTAL, CKMB, CKMBINDEX, TROPONINI in the last 168 hours. CBG: Recent Labs  Lab 10/02/23 1715 10/02/23 2104 10/03/23 0619 10/03/23 0742 10/03/23 0840  GLUCAP 282* 237* 69* 64* 68*    Iron  Studies: No results for input(s): IRON , TIBC, TRANSFERRIN, FERRITIN in the last 72 hours. Studies/Results: No results found.  apixaban   5 mg Oral BID   bacitracin    Topical Daily    calcitRIOL   1.5 mcg Oral Q M,W,F-HD   Chlorhexidine  Gluconate Cloth  6 each Topical Q0600   darbepoetin (ARANESP ) injection - DIALYSIS  100 mcg Subcutaneous Q Sun-1800   dextrose   1 Tube Oral Once   dextrose   25 mL Intravenous Once   gabapentin   100 mg Oral TID   Gerhardt's butt cream   Topical BID   heparin  sodium (porcine)  4,600 Units Intracatheter Once   influenza vac split trivalent PF  0.5 mL Intramuscular Tomorrow-1000   insulin  aspart  0-6 Units Subcutaneous TID AC & HS   loratadine   10 mg Oral Daily   metoprolol  tartrate  25 mg Oral BID   midodrine   10 mg Oral Q M,W,F-HD   pantoprazole   40 mg Oral Daily   povidone-Iodine    Topical Q0600   QUEtiapine   12.5 mg Oral q morning   QUEtiapine   25 mg Oral QHS   senna-docusate  2 tablet Oral BID   sevelamer  carbonate  1,600 mg Oral TID WC   sodium zirconium cyclosilicate   10 g Oral TID    BMET    Component Value Date/Time   NA 138 10/01/2023 1205   NA 138  08/22/2014 0447   K 4.8 10/01/2023 1205   K 4.0 08/22/2014 0447   CL 96 (L) 10/01/2023 1205   CL 100 08/22/2014 0447   CO2 24 10/01/2023 1205   CO2 30 08/22/2014 0447   GLUCOSE 199 (H) 10/01/2023 1205   GLUCOSE 88 08/22/2014 0447   BUN 35 (H) 10/01/2023 1205   BUN 14 08/22/2014 0447   CREATININE 6.38 (H) 10/01/2023 1205   CREATININE 5.00 (H) 08/22/2014 0447   CALCIUM  9.2 10/01/2023 1205   CALCIUM  8.2 (L) 08/22/2014 0447   GFRNONAA 10 (L) 10/01/2023 1205   GFRNONAA 14 (L) 08/22/2014 0447   GFRNONAA 5 (L) 06/19/2014 0937   GFRAA 7 (L) 05/30/2020 1923   GFRAA 17 (L) 08/22/2014 0447   GFRAA 6 (L) 06/19/2014 0937   CBC    Component Value Date/Time   WBC 6.6 10/02/2023 0901   RBC 2.81 (L) 10/02/2023 0901   HGB 7.9 (L) 10/02/2023 0901   HGB 9.7 (L) 08/22/2014 0447   HCT 26.3 (L) 10/02/2023 0901   HCT 30.0 (L) 08/22/2014 0447   PLT 206 10/02/2023 0901   PLT 158 08/22/2014 0447   MCV 93.6 10/02/2023 0901   MCV 97 08/22/2014 0447   MCH 28.1 10/02/2023 0901   MCHC  30.0 10/02/2023 0901   RDW 15.6 (H) 10/02/2023 0901   RDW 15.3 (H) 08/22/2014 0447   LYMPHSABS 1.1 09/27/2023 0250   LYMPHSABS 1.7 08/22/2014 0447   MONOABS 0.6 09/27/2023 0250   MONOABS 0.8 08/22/2014 0447   EOSABS 0.6 (H) 09/27/2023 0250   EOSABS 0.4 08/22/2014 0447   BASOSABS 0.0 09/27/2023 0250   BASOSABS 0.1 08/22/2014 0447    OP HD: [facility] MWF  4h   400/1.5  77kg  L fem TDC 2/3 bath  Heparin  none   Assessment/ Plan: 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 Not a candidate for surgery here. SP line holiday w/ new TDC placed 10/18. Continues on IV vanc upon return from [place] . Adjusted plan per ID pharm is 750mg  MWF through 10/27/23. Hyperkalemia - Pt not adhering to renal diet though sign on door with specific instructions for dietary.  Lokelma  has been increased to BID, remains high increase to TID.  Follow daily labs - K mid 4s now, cont TID lokelma .  SP I&D brain abscess: drained 12/04 at [facility]. Mentation improved. Acute bilat CVA: suspected consequence of septic emboli  ESRD: on HD MWF - BP has been on low side, will add pre HD midodrine .  Volume: on RA, L arm + edema, no other edema. 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  Needs to elevate arm Right arm swelling and pain - likely due to IV infiltration.  Will need to remove and follow for infection.  Anemia: Hb 8-9s, on ESA. Hold iron  infusion while on IV ABX. 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 Cont binders w/ meals.  Hypertension: BP on low side recently, on low dose BB w h/o atrial arrhythmia.   HD access:  L  femoral TDC placed on 10/18. 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 Type 1 DM: per primary.  Drop in glucose this morning.  Had been running in the 200's.   Dispo - tolerated full HD 12/27 in chair. Cont efforts to dialyze in chair.  Appears SNF being pursued and has 2 options in [place] and 1 in [place].   He is leaning towards  [name].  SW assisting.   Fairy RONAL Sellar, MD [employer] [phone]

## 2023-10-03 NOTE — Progress Notes (Signed)
 Pt's CBG continues to be low this morning. Pt refused to drink juice, then had breakfast, however, his CBG remained only 68. MD Nettey on the unit and made aware. MD Caleb Popp said that he would talk to the pt.

## 2023-10-03 NOTE — Progress Notes (Signed)
 This RN asked MD Coladonato if he wanted to check the pt's potassium level before giving Lokelma. MD Coladonato ordered the lab work this morning. The pt has refused the blood work twice today. Will continue to monitor.

## 2023-10-04 DIAGNOSIS — R7881 Bacteremia: Secondary | ICD-10-CM | POA: Diagnosis not present

## 2023-10-04 DIAGNOSIS — I120 Hypertensive chronic kidney disease with stage 5 chronic kidney disease or end stage renal disease: Secondary | ICD-10-CM | POA: Diagnosis not present

## 2023-10-04 DIAGNOSIS — N250 Renal osteodystrophy: Secondary | ICD-10-CM | POA: Diagnosis not present

## 2023-10-04 DIAGNOSIS — I079 Rheumatic tricuspid valve disease, unspecified: Secondary | ICD-10-CM | POA: Diagnosis not present

## 2023-10-04 DIAGNOSIS — I059 Rheumatic mitral valve disease, unspecified: Secondary | ICD-10-CM | POA: Diagnosis not present

## 2023-10-04 DIAGNOSIS — N186 End stage renal disease: Secondary | ICD-10-CM | POA: Diagnosis not present

## 2023-10-04 DIAGNOSIS — I38 Endocarditis, valve unspecified: Secondary | ICD-10-CM | POA: Diagnosis not present

## 2023-10-04 DIAGNOSIS — Z992 Dependence on renal dialysis: Secondary | ICD-10-CM | POA: Diagnosis not present

## 2023-10-04 DIAGNOSIS — B9562 Methicillin resistant Staphylococcus aureus infection as the cause of diseases classified elsewhere: Secondary | ICD-10-CM | POA: Diagnosis not present

## 2023-10-04 DIAGNOSIS — D631 Anemia in chronic kidney disease: Secondary | ICD-10-CM | POA: Diagnosis not present

## 2023-10-04 DIAGNOSIS — E1022 Type 1 diabetes mellitus with diabetic chronic kidney disease: Secondary | ICD-10-CM | POA: Diagnosis not present

## 2023-10-04 LAB — RENAL FUNCTION PANEL
Albumin: 2.7 g/dL — ABNORMAL LOW (ref 3.5–5.0)
Anion gap: 16 — ABNORMAL HIGH (ref 5–15)
BUN: 39 mg/dL — ABNORMAL HIGH (ref 6–20)
CO2: 24 mmol/L (ref 22–32)
Calcium: 9.1 mg/dL (ref 8.9–10.3)
Chloride: 97 mmol/L — ABNORMAL LOW (ref 98–111)
Creatinine, Ser: 6.45 mg/dL — ABNORMAL HIGH (ref 0.61–1.24)
GFR, Estimated: 10 mL/min — ABNORMAL LOW (ref >60)
Glucose, Bld: 319 mg/dL — ABNORMAL HIGH (ref 70–99)
Phosphorus: 9.4 mg/dL — ABNORMAL HIGH (ref 2.5–4.6)
Potassium: 5.4 mmol/L — ABNORMAL HIGH (ref 3.5–5.1)
Sodium: 137 mmol/L (ref 135–145)

## 2023-10-04 LAB — CBC
HCT: 27.3 % — ABNORMAL LOW (ref 39.0–52.0)
Hemoglobin: 8 g/dL — ABNORMAL LOW (ref 13.0–17.0)
MCH: 28.2 pg (ref 26.0–34.0)
MCHC: 29.3 g/dL — ABNORMAL LOW (ref 30.0–36.0)
MCV: 96.1 fL (ref 80.0–100.0)
Platelets: 205 10*3/uL (ref 150–400)
RBC: 2.84 MIL/uL — ABNORMAL LOW (ref 4.22–5.81)
RDW: 15.9 % — ABNORMAL HIGH (ref 11.5–15.5)
WBC: 6.1 10*3/uL (ref 4.0–10.5)
nRBC: 0 % (ref 0.0–0.2)

## 2023-10-04 LAB — GLUCOSE, CAPILLARY
Glucose-Capillary: 264 mg/dL — ABNORMAL HIGH (ref 70–99)
Glucose-Capillary: 217 mg/dL — ABNORMAL HIGH (ref 70–99)
Glucose-Capillary: 217 mg/dL — ABNORMAL HIGH (ref 70–99)
Glucose-Capillary: 371 mg/dL — ABNORMAL HIGH (ref 70–99)
Glucose-Capillary: 313 mg/dL — ABNORMAL HIGH (ref 70–99)

## 2023-10-04 MED ORDER — INSULIN ASPART 100 UNIT/ML IJ SOLN
1.0000 [IU] | Freq: Once | INTRAMUSCULAR | Status: AC
  Administered 2023-10-04: 1 [IU] via SUBCUTANEOUS

## 2023-10-04 NOTE — Progress Notes (Signed)
 Patient ID: [name], male   DOB: March 15, 1981, 43 y.o.   MRN: [id number] S: Pt seen in HD unit. 2.9 L UF w/ HD today.   O:BP 115/80   Pulse (!) 119   Temp 98.9 F (37.2 C)   Resp (!) 24   Ht 5' 7 (1.702 m)   Wt 90.1 kg   SpO2 98%   BMI 31.11 kg/m   Intake/Output Summary (Last 24 hours) at 10/04/2023 1531 Last data filed at 10/04/2023 1307 Gross per 24 hour  Intake 240 ml  Output 2.9 ml  Net 237.1 ml   Intake/Output: I/O last 3 completed shifts: In: 1071.5 [P.O.:570; IV Piggyback:501.5] Out: -   Intake/Output this shift:  Total I/O In: 120 [P.O.:120] Out: 2.9 [Other:2.9] Weight change:  Gen: NAD CVS: Tachy Resp: CTA Abd: +BS, soft, NT/ND Ext: s/p LAKA, 2+ LUE edema.  Recent Labs  Lab 09/29/23 1515 10/01/23 1205 10/03/23 2112 10/04/23 0235  NA 136 138  --  137  K 4.5 4.8 5.2* 5.4*  CL 96* 96*  --  97*  CO2 24 24  --  24  GLUCOSE 205* 199*  --  319*  BUN 42* 35*  --  39*  CREATININE 6.33* 6.38*  --  6.45*  ALBUMIN  2.6* 2.4*  --  2.7*  CALCIUM  9.0 9.2  --  9.1  PHOS 7.8* 8.4*  --  9.4*   Liver Function Tests: Recent Labs  Lab 09/29/23 1515 10/01/23 1205 10/04/23 0235  ALBUMIN  2.6* 2.4* 2.7*   No results for input(s): LIPASE, AMYLASE in the last 168 hours. No results for input(s): AMMONIA in the last 168 hours. CBC: Recent Labs  Lab 09/29/23 1515 10/01/23 1205 10/02/23 0901 10/04/23 0933  WBC 5.5 6.2 6.6 6.1  HGB 7.9* 7.4* 7.9* 8.0*  HCT 25.9* 24.6* 26.3* 27.3*  MCV 93.2 93.9 93.6 96.1  PLT 185 190 206 205   Cardiac Enzymes: No results for input(s): CKTOTAL, CKMB, CKMBINDEX, TROPONINI in the last 168 hours. CBG: Recent Labs  Lab 10/03/23 1342 10/03/23 1607 10/03/23 2131 10/04/23 0609 10/04/23 1412  GLUCAP 212* 322* 293* 264* 217*    Iron  Studies: No results for input(s): IRON , TIBC, TRANSFERRIN, FERRITIN in the last 72 hours. Studies/Results: No results found.  apixaban   5 mg Oral BID   bacitracin    Topical  Daily   calcitRIOL   1.5 mcg Oral Q M,W,F-HD   Chlorhexidine  Gluconate Cloth  6 each Topical Q0600   darbepoetin (ARANESP ) injection - DIALYSIS  100 mcg Subcutaneous Q Sun-1800   dextrose   1 Tube Oral Once   dextrose   25 mL Intravenous Once   gabapentin   100 mg Oral TID   Gerhardt's butt cream   Topical BID   influenza vac split trivalent PF  0.5 mL Intramuscular Tomorrow-1000   insulin  aspart  0-6 Units Subcutaneous TID WC   insulin  glargine-yfgn  2 Units Subcutaneous Daily   loratadine   10 mg Oral Daily   metoprolol  tartrate  25 mg Oral BID   midodrine   10 mg Oral Q M,W,F-HD   pantoprazole   40 mg Oral Daily   povidone-Iodine    Topical Q0600   QUEtiapine   12.5 mg Oral q morning   QUEtiapine   25 mg Oral QHS   senna-docusate  2 tablet Oral BID   sevelamer  carbonate  1,600 mg Oral TID WC   sodium zirconium cyclosilicate   10 g Oral TID    BMET    Component Value Date/Time   NA 137 10/04/2023  0235   NA 138 08/22/2014 0447   K 5.4 (H) 10/04/2023 0235   K 4.0 08/22/2014 0447   CL 97 (L) 10/04/2023 0235   CL 100 08/22/2014 0447   CO2 24 10/04/2023 0235   CO2 30 08/22/2014 0447   GLUCOSE 319 (H) 10/04/2023 0235   GLUCOSE 88 08/22/2014 0447   BUN 39 (H) 10/04/2023 0235   BUN 14 08/22/2014 0447   CREATININE 6.45 (H) 10/04/2023 0235   CREATININE 5.00 (H) 08/22/2014 0447   CALCIUM  9.1 10/04/2023 0235   CALCIUM  8.2 (L) 08/22/2014 0447   GFRNONAA 10 (L) 10/04/2023 0235   GFRNONAA 14 (L) 08/22/2014 0447   GFRNONAA 5 (L) 06/19/2014 0937   GFRAA 7 (L) 05/30/2020 1923   GFRAA 17 (L) 08/22/2014 0447   GFRAA 6 (L) 06/19/2014 0937   CBC    Component Value Date/Time   WBC 6.1 10/04/2023 0933   RBC 2.84 (L) 10/04/2023 0933   HGB 8.0 (L) 10/04/2023 0933   HGB 9.7 (L) 08/22/2014 0447   HCT 27.3 (L) 10/04/2023 0933   HCT 30.0 (L) 08/22/2014 0447   PLT 205 10/04/2023 0933   PLT 158 08/22/2014 0447   MCV 96.1 10/04/2023 0933   MCV 97 08/22/2014 0447   MCH 28.2 10/04/2023 0933   MCHC  29.3 (L) 10/04/2023 0933   RDW 15.9 (H) 10/04/2023 0933   RDW 15.3 (H) 08/22/2014 0447   LYMPHSABS 1.1 09/27/2023 0250   LYMPHSABS 1.7 08/22/2014 0447   MONOABS 0.6 09/27/2023 0250   MONOABS 0.8 08/22/2014 0447   EOSABS 0.6 (H) 09/27/2023 0250   EOSABS 0.4 08/22/2014 0447   BASOSABS 0.0 09/27/2023 0250   BASOSABS 0.1 08/22/2014 0447    OP HD: [facility] MWF  4h   400/1.5  77kg  L fem TDC 2/3 bath  Heparin  none   Assessment/ Plan: 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 Not a candidate for surgery here. SP line holiday w/ new TDC placed 10/18. Continues on IV vanc upon return from [place] . Adjusted plan per ID pharm is 750mg  MWF through 10/27/23. Hyperkalemia - Pt not adhering to renal diet though sign on door with specific instructions for dietary.  Follow daily labs - K mid 4s-  low 5s now, cont TID lokelma  (but he is only getting 1-2 doses per day) for now. Will consider 1K bath for 1-2 hrs three times per week.  SP I&D brain abscess: s/p abscess drainage 12/04 at [facility]. Mentation improved. 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 HD today.  Hypotension on HD: added pre HD midodrine .  Volume: on RA, L arm + edema. Wts are up. UF as tolerated with HD Acute LUE DVT: w/ sig LUE edema. Cont eliquis .  Anemia: Hb 8-9s, on ESA. Hold iron  infusion while on IV ABX. 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 Cont binders w/ meals.  Hypertension: BP on low side recently, on low dose BB w h/o atrial arrhythmia.   HD access:  L  femoral TDC placed on 10/18. 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 Type 1 DM: per primary.  Drop in glucose this morning.  Had been running in the 200's.   Dispo - tolerated full HD 12/27 in chair. Cont efforts to dialyze in chair.  Appears SNF being pursued and has 2 options in [place] and 1 in [place].  He is leaning towards [facility].  SW assisting.  [name]  MD  [facility] 10/04/2023, 3:41 PM  Recent Labs  Lab 10/01/23 1205 10/02/23 0901 10/03/23 2112 10/04/23 0235 10/04/23 0933  HGB 7.4* 7.9*  --   --  8.0*  ALBUMIN  2.4*  --   --  2.7*  --   CALCIUM  9.2  --   --  9.1  --   PHOS 8.4*  --   --  9.4*  --   CREATININE 6.38*  --   --  6.45*  --   K 4.8  --  5.2* 5.4*  --     Inpatient medications:  apixaban   5 mg Oral BID   bacitracin    Topical Daily   calcitRIOL   1.5 mcg Oral Q M,W,F-HD   Chlorhexidine  Gluconate Cloth  6 each Topical Q0600   darbepoetin (ARANESP ) injection - DIALYSIS  100 mcg Subcutaneous Q Sun-1800   dextrose   1 Tube Oral Once   dextrose   25 mL Intravenous Once   gabapentin   100 mg Oral TID   Gerhardt's butt cream   Topical BID   influenza vac split trivalent PF  0.5 mL Intramuscular Tomorrow-1000   insulin  aspart  0-6 Units Subcutaneous TID WC   insulin  glargine-yfgn  2 Units Subcutaneous Daily   loratadine   10 mg Oral Daily   metoprolol  tartrate  25 mg Oral BID   midodrine   10 mg Oral Q M,W,F-HD   pantoprazole   40 mg Oral Daily   povidone-Iodine    Topical Q0600   QUEtiapine   12.5 mg Oral q morning   QUEtiapine   25 mg Oral QHS   senna-docusate  2 tablet Oral BID   sevelamer  carbonate  1,600 mg Oral TID WC   sodium zirconium cyclosilicate   10 g Oral TID    albumin  human Stopped (10/03/23 0701)   vancomycin  Stopped (10/04/23 1407)   acetaminophen , albumin  human, clonazepam , diphenhydrAMINE , diphenhydrAMINE -zinc  acetate, Gerhardt's butt cream, hydrALAZINE , loperamide , metoprolol  tartrate, ondansetron  (ZOFRAN ) IV, oxyCODONE , polyethylene glycol

## 2023-10-04 NOTE — Progress Notes (Signed)
 PROGRESS NOTE    [name]  FMW:[id number] DOB: [birth year] DOA: 09/09/2023 PCP: Celestia Rosaline SQUIBB, NP   Brief Narrative: [name] is a 43 y.o. male with medical history significant of ESRD on hemodialysis (not fully compliant), insulin -dependent type 1 diabetes, gastroparesis, Left AKA, admitted to [facility] on 07/09/23 for DKA, Sepsis and hospital course significant for MV AND TV  MRSA endocarditis, followed by acute MCA stroke , left occipital and bilateral cerebellar strokes consistent with septic emboli,  Brain abscess from MRSA, left IJ DVT was transferred to [facility] for MV repair on 11/29.  While at [facility] he underwent cardiac cath showing non obstructive disease, craniotomy with abscess drainage on 09/01/23,. He had subarachnoid hemorrhage while on IV heparin , right third toe distal necrosis,was seen by podiatry, but family wanted conservative management.  Since his tertiary care needs have been completed, he was transferred back to [facility] for further management.  PTOT  recommending SNF and awaiting placement.   Assessment and Plan:  MRSA TV/MV endocarditis Positive MRSA blood cultures. Diagnosed on Transesophageal Echocardiogram. Patient managed on Vancomycin . At outside hospital, patient underwent a left heart catheterization which was significant for nonobstructive disease. Infectious disease recommendations to continue Vancomycin  IV to complete 8 weeks of treatment with an end date of 1/29. Recommendation for outpatient ID follow-up with appointment scheduled for 10/12/2023.  Prostate fluid collection/abscess Urology consulted at outside hospital and per their assessment, fluid collection was secondary to dilated seminal vesicle with no surgical intervention recommended. Infectious disease with continued concern for possible abscess. Patient covered with vancomycin  with recommendation for outpatient urology follow-up.  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 Patient evaluated by neurosurgery. Concern for abscess ruled out by neurosurgery initially. No surgical intervention per neurosurgery. However, at outside hospital, patient was evaluated by neurosurgery who performed a craniotomy for abscess drainage.  ESRD on HD Nephrology consulted and managing hemodialysis.  Hyperkalemia Improved with HD.  Anemia of chronic kidney disease Hemoglobin stable.  CKD mineral bone disease -Continue calcitriol   Acute CVA MRI confirms large right MCA branch infarct. MRA head (11/25) significant for large necrotic appearing lesion within the right frontal lobe within area of the prior right MCA territory infarct with vasogenic edema and 8 mm leftward midline shift. LDL of 34. Hemoglobin A1C of 8.9%. Transthoracic Echocardiogram (10/13) significant for an LVEF of 25-30% with concern for AV/MV abnormality with Transesophageal Echocardiogram (10/16 & 10/22) significant for MV/TV vegetations. Neurology recommendations for Eliquis . PT/OT recommendations for SNF.  Primary hypertension Patient is on metoprolol  as an outpatient. -Continue metoprolol   Acute left upper extremity DVT -Continue Eliquis   Right third toe necrosis Noted. Per documentation, patient/family opting for conservative management and decline surgical management.  Diabetes mellitus type 1 Poorly controlled with hyperglycemia and hypoglycemia. Hemoglobin A1C of 8.9%. Partly due to poor dietary choice. Before bedtime dosing discontinued. -Continue Semglee  2 units daily and SSI  Gastroparesis Noted. Patient is not on medication management apart from insulin  for blood sugar control. Patient without symptoms and is tolerating his diet.  History of left BKA Noted. Patient seen by PT/OT with recommendation for SNF on discharge.  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 Patient started on Seroquel  for behavioral change management.  Overweight Estimated body mass index is  32.11 kg/m as calculated from the following:   Height as of this encounter: 5' 7 (1.702 m).   Weight as of this encounter: 93 kg.  Pressure injury Left buttock. Not present on admission.  DVT prophylaxis: Eliquis  Code Status:   Code Status: Full Code Family Communication: None at bedside Disposition Plan: Discharge to SNF pending bed availability   Consultants:    Procedures:    Antimicrobials: Vancomycin  Linezolid     Subjective: In hemodialysis. No issues noted from overnight notes.  Objective: BP 122/68   Pulse (!) 110   Temp 97.8 F (36.6 C)   Resp 19   Ht 5' 7 (1.702 m)   Wt 93 kg   SpO2 96%   BMI 32.11 kg/m   Examination:  General exam: Appears calm and comfortable Respiratory system:  Respiratory effort normal.    Data Reviewed: I have personally reviewed following labs and imaging studies  CBC Lab Results  Component Value Date   WBC 6.1 10/04/2023   RBC 2.84 (L) 10/04/2023   HGB 8.0 (L) 10/04/2023   HCT 27.3 (L) 10/04/2023   MCV 96.1 10/04/2023   MCH 28.2 10/04/2023   PLT 205 10/04/2023   MCHC 29.3 (L) 10/04/2023   RDW 15.9 (H) 10/04/2023   LYMPHSABS 1.1 09/27/2023   MONOABS 0.6 09/27/2023   EOSABS 0.6 (H) 09/27/2023   BASOSABS 0.0 09/27/2023     Last metabolic panel Lab Results  Component Value Date   NA 137 10/04/2023   K 5.4 (H) 10/04/2023   CL 97 (L) 10/04/2023   CO2 24 10/04/2023   BUN 39 (H) 10/04/2023   CREATININE 6.45 (H) 10/04/2023   GLUCOSE 319 (H) 10/04/2023   GFRNONAA 10 (L) 10/04/2023   GFRAA 7 (L) 05/30/2020   CALCIUM  9.1 10/04/2023   PHOS 9.4 (H) 10/04/2023   PROT 7.6 09/09/2023   ALBUMIN  2.7 (L) 10/04/2023   BILITOT 0.8 09/09/2023   ALKPHOS 116 09/09/2023   AST 15 09/09/2023   ALT 12 09/09/2023   ANIONGAP 16 (H) 10/04/2023    GFR: Estimated Creatinine Clearance: 15.9 mL/min (A) (by C-G formula based on SCr of 6.45 mg/dL (H)).  No results found for this or any previous visit (from the past 240  hours).    Radiology Studies: No results found.    LOS: 25 days    Elgin Lam, MD [facility] 10/04/2023, 11:26 AM   If 7PM-7AM, please contact night-coverage [email or web address]

## 2023-10-04 NOTE — Progress Notes (Signed)
 Pharmacy Antibiotic Note  [name] is a 43 y.o. 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 Pt was transferred to [facility] for MV repair 11/29 and craniotomy with abscess drainage on 12/4. Pharmacy has been consulted for continued Vancomycin  dosing.  1/3 vanc level at goal   Plan: Continue Vancomycin  500 mg IV post HD MWF through 10/27/23 Will monitor HD tolerance and dosing    Height: 5' 7 (170.2 cm) Weight: 89.4 kg (197 lb 1.5 oz) IBW/kg (Calculated) : 66.1  Temp (24hrs), Avg:97.9 F (36.6 C), Min:97.1 F (36.2 C), Max:98.5 F (36.9 C)  Recent Labs  Lab 09/29/23 1515 10/01/23 1135 10/01/23 1205 10/02/23 0901 10/04/23 0235  WBC 5.5  --  6.2 6.6  --   CREATININE 6.33*  --  6.38*  --  6.45*  VANCORANDOM  --  15  --   --   --     Estimated Creatinine Clearance: 15.6 mL/min (A) (by C-G formula based on SCr of 6.45 mg/dL (H)).    No Known Allergies  Antimicrobials this admission: Vancomycin  10/12 >>(10/27/23) Cefepime  10/21 >> 10/23; restart 10/25 >> 10/29 Linezolid  11/27 >> 12/10   Vancomycin  levels  -10/17 preHD>>25 -10/24: VR= 16 -11/1: VR= 24 -11/8 VR 22 -11/15 VR 22  -11/22 VR 28 - held dose x1, decrease to 500mg  -11/28 VR 18 - cont 500mg  MWF 11/29 - Transferred to [facility] -12/11 VR 19.8 (at [facility]) - mostly 750mg  with HD (occasionally 500mg  instead) - 12/16 VR 25 mcg/mL- continue 750 mg iv MWF - 12/24 VR 27 mcg/mL- hold 1 dose / reduce to 500 mg iv MWF thereafter - 10/01/23 VR 15 - therapeutic    Microbiology results: 10/12 MRSA PCR >> positive 10/12 CDiff antigen +, toxin -, PCR - >> negative 10/12 BCx >> 4/4 MRSA 10/13 BCx >> Negative 10/17 BCx >> Negative 10/21 RCx >> Negative 11/26 Bcx >> negative 11/27 Bcx >> Negative  Jinnie Door, PharmD, BCPS, BCCP Clinical Pharmacist  Please check AMION for all [facility] Pharmacy phone numbers After 10:00 PM, call Main Pharmacy [phone]

## 2023-10-04 NOTE — Plan of Care (Signed)
  Problem: Education: Goal: Knowledge of General Education information will improve Description: Including pain rating scale, medication(s)/side effects and non-pharmacologic comfort measures Outcome: Progressing   Problem: Health Behavior/Discharge Planning: Goal: Ability to manage health-related needs will improve Outcome: Progressing   Problem: Clinical Measurements: Goal: Ability to maintain clinical measurements within normal limits will improve Outcome: Progressing Goal: Will remain free from infection Outcome: Progressing Goal: Diagnostic test results will improve Outcome: Progressing Goal: Cardiovascular complication will be avoided Outcome: Progressing   Problem: Education: Goal: Ability to describe self-care measures that may prevent or decrease complications (Diabetes Survival Skills Education) will improve Outcome: Progressing Goal: Individualized Educational Video(s) Outcome: Progressing   Problem: Skin Integrity: Goal: Risk for impaired skin integrity will decrease Outcome: Progressing   Problem: Coping: Goal: Ability to adjust to condition or change in health will improve Outcome: Progressing

## 2023-10-04 NOTE — TOC Progression Note (Signed)
 Transition of Care [facility]) - Progression Note    Patient Details  Name: [name] MRN: [id number] Date of Birth: 04-28-1981  [facility] CM/SW Contact  [name], [place] Phone Number: 10/04/2023, 8:33 AM  Clinical Narrative:   [place] ins shara for [facility] still pending at this time.    Expected Discharge Plan: Skilled Nursing Facility Barriers to Discharge: Insurance Authorization  Expected Discharge Plan and Services                                               Social Determinants of Health (SDOH) Interventions SDOH Screenings   Food Insecurity: No Food Insecurity (09/09/2023)  Housing: Unknown (09/09/2023)  Transportation Needs: No Transportation Needs (09/09/2023)  Utilities: Not At Risk (09/09/2023)  Alcohol Screen: Low Risk  (02/15/2023)  Depression (PHQ2-9): Low Risk  (02/15/2023)  Financial Resource Strain: Low Risk  (02/15/2023)  Physical Activity: Inactive (02/15/2023)  Social Connections: Socially Integrated (02/15/2023)  Stress: No Stress Concern Present (02/15/2023)  Tobacco Use: Low Risk  (09/12/2023)  Recent Concern: Tobacco Use - Medium Risk (09/01/2023)   Received from [facility]    Readmission Risk Interventions     No data to display

## 2023-10-04 NOTE — Procedures (Signed)
 Received patient in bed to unit.  Alert and oriented.  Informed consent signed and in chart.   TX duration:2 hours 55 min  Patient tolerated well.  Transported back to the room  Alert, without acute distress.  Hand-off given to patient's nurse.   Access used: left femoral cath Access issues: none  Total UF removed: 2900 ml  Greig Silvan, RN Kidney Dialysis Unit

## 2023-10-04 NOTE — Inpatient Diabetes Management (Signed)
 Inpatient Diabetes Program Recommendations  AACE/ADA: New Consensus Statement on Inpatient Glycemic Control (2015)  Target Ranges:  Prepandial:   less than 140 mg/dL      Peak postprandial:   less than 180 mg/dL (1-2 hours)      Critically ill patients:  140 - 180 mg/dL   Lab Results  Component Value Date   GLUCAP 264 (H) 10/04/2023   HGBA1C 8.9 (H) 07/10/2023    Review of Glycemic Control  Latest Reference Range & Units 10/03/23 16:07 10/03/23 21:31 10/04/23 06:09  Glucose-Capillary 70 - 99 mg/dL 677 (H) 706 (H) 735 (H)  (H): Data is abnormally high Diabetes history: DM1 Outpatient Diabetes medications: Lantus  4 daily, Humalog 0-4 TID Current orders for Inpatient glycemic control:  Semglee  2 units Daily Novolog  0-6 TID with meals and  HS  Inpatient Diabetes Program Recommendations:    Noted hypoglycemia yesterday and reduction of basal. Of note, hypoglycemia was followed by total of 5 units of Novolog ; one dose of which was given > 1 hour following the CBG.  Could consider increasing Semglee  back to 4 units every day.   Thanks, Tinnie Minus, MSN, RNC-OB Diabetes Coordinator [phone] (8a-5p)

## 2023-10-05 DIAGNOSIS — Z992 Dependence on renal dialysis: Secondary | ICD-10-CM | POA: Diagnosis not present

## 2023-10-05 DIAGNOSIS — N186 End stage renal disease: Secondary | ICD-10-CM | POA: Diagnosis not present

## 2023-10-05 DIAGNOSIS — I059 Rheumatic mitral valve disease, unspecified: Secondary | ICD-10-CM | POA: Diagnosis not present

## 2023-10-05 DIAGNOSIS — E1022 Type 1 diabetes mellitus with diabetic chronic kidney disease: Secondary | ICD-10-CM | POA: Diagnosis not present

## 2023-10-05 DIAGNOSIS — I120 Hypertensive chronic kidney disease with stage 5 chronic kidney disease or end stage renal disease: Secondary | ICD-10-CM | POA: Diagnosis not present

## 2023-10-05 DIAGNOSIS — R7881 Bacteremia: Secondary | ICD-10-CM | POA: Diagnosis not present

## 2023-10-05 DIAGNOSIS — I38 Endocarditis, valve unspecified: Secondary | ICD-10-CM | POA: Diagnosis not present

## 2023-10-05 DIAGNOSIS — N250 Renal osteodystrophy: Secondary | ICD-10-CM | POA: Diagnosis not present

## 2023-10-05 DIAGNOSIS — I079 Rheumatic tricuspid valve disease, unspecified: Secondary | ICD-10-CM | POA: Diagnosis not present

## 2023-10-05 DIAGNOSIS — B9562 Methicillin resistant Staphylococcus aureus infection as the cause of diseases classified elsewhere: Secondary | ICD-10-CM | POA: Diagnosis not present

## 2023-10-05 DIAGNOSIS — D631 Anemia in chronic kidney disease: Secondary | ICD-10-CM | POA: Diagnosis not present

## 2023-10-05 LAB — GLUCOSE, CAPILLARY
Glucose-Capillary: 263 mg/dL — ABNORMAL HIGH (ref 70–99)
Glucose-Capillary: 439 mg/dL — ABNORMAL HIGH (ref 70–99)
Glucose-Capillary: 311 mg/dL — ABNORMAL HIGH (ref 70–99)
Glucose-Capillary: 262 mg/dL — ABNORMAL HIGH (ref 70–99)

## 2023-10-05 MED ORDER — CHLORHEXIDINE GLUCONATE CLOTH 2 % EX PADS
6.0000 | MEDICATED_PAD | Freq: Every day | CUTANEOUS | Status: DC
Start: 2023-10-06 — End: 2023-10-11
  Administered 2023-10-07 – 2023-10-10 (×4): 6 via TOPICAL

## 2023-10-05 NOTE — Progress Notes (Signed)
 PROGRESS NOTE    [name]  FMW:[id number] DOB: 07-Apr-1981 DOA: 09/09/2023 PCP: Celestia Rosaline SQUIBB, NP   Brief Narrative: [name] is a 43 y.o. 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  While at [facility] he underwent cardiac cath showing non obstructive disease, craniotomy with abscess drainage on 09/01/23,. He had subarachnoid hemorrhage while on IV heparin , right third toe distal necrosis,was seen by podiatry, but family wanted conservative management. Since his tertiary care needs have been completed, he was transferred back to [facility] for further management.  PTOT  recommending SNF and awaiting placement.   Assessment and Plan:  MRSA TV/MV endocarditis Positive MRSA blood cultures. Diagnosed on Transesophageal Echocardiogram. Patient managed on Vancomycin . At outside hospital, patient underwent a left heart catheterization which was significant for nonobstructive disease. Infectious disease recommendations to continue Vancomycin  IV to complete 8 weeks of treatment with an end date of 1/29. Recommendation for outpatient ID follow-up with appointment scheduled for 10/12/2023.  Prostate fluid collection/abscess Urology consulted at outside hospital and per their assessment, fluid collection was secondary to dilated seminal vesicle with no surgical intervention recommended. Infectious disease with continued concern for possible abscess. Patient covered with vancomycin  with recommendation for outpatient urology follow-up.  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 Patient evaluated by neurosurgery. Concern for abscess ruled out by neurosurgery initially. No surgical intervention per neurosurgery. However, at outside hospital, patient was evaluated by neurosurgery who performed a craniotomy for abscess drainage.  ESRD on HD Nephrology consulted and managing hemodialysis.  Hyperkalemia Improved with HD.  Anemia of chronic kidney disease Hemoglobin stable.  CKD mineral bone disease -Continue calcitriol   Acute CVA MRI confirms large right MCA branch infarct. MRA head (11/25) significant for large necrotic appearing lesion within the right frontal lobe within area of the prior right MCA territory infarct with vasogenic edema and 8 mm leftward midline shift. LDL of 34. Hemoglobin A1C of 8.9%. Transthoracic Echocardiogram (10/13) significant for an LVEF of 25-30% with concern for AV/MV abnormality with Transesophageal Echocardiogram (10/16 & 10/22) significant for MV/TV vegetations. Neurology recommendations for Eliquis . PT/OT recommendations for SNF.  Primary hypertension Patient is on metoprolol  as an outpatient. -Continue metoprolol   Acute left upper extremity DVT -Continue Eliquis   Right third toe necrosis Noted. Per documentation, patient/family opting for conservative management and decline surgical management.  Diabetes mellitus type 1 Poorly controlled with hyperglycemia and hypoglycemia. Hemoglobin A1C of 8.9%. Partly due to poor dietary choice. Before bedtime dosing discontinued. -Continue Semglee  2 units daily and SSI  Gastroparesis Noted. Patient is not on medication management apart from insulin  for blood sugar control. Patient without symptoms and is tolerating his diet.  History of left BKA Noted. Patient seen by PT/OT with recommendation for SNF on discharge.  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 Patient started on Seroquel  for behavioral change management.  Overweight Estimated body mass index is  32.08 kg/m as calculated from the following:   Height as of this encounter: 5' 7 (1.702 m).   Weight as of this encounter: 92.9 kg.  Pressure injury Left buttock. Not present on admission.   DVT  prophylaxis: Eliquis  Code Status:   Code Status: Full Code Family Communication: None at bedside Disposition Plan: Discharge to SNF pending bed availability   Consultants:    Procedures:    Antimicrobials: Vancomycin  Linezolid     Subjective: No medical concerns this morning.  Objective: BP (!) 153/99 (BP Location: Right Arm)   Pulse (!) 101   Temp 98 F (36.7 C) (Oral)   Resp 15   Ht 5' 7 (1.702 m)   Wt 92.9 kg   SpO2 100%   BMI 32.08 kg/m   Examination:  General exam: Appears calm and comfortable Respiratory system:  Respiratory effort normal. Central nervous system: Alert and oriented. Musculoskeletal: Left BKA. Psychiatry: Judgement and insight appear normal. Mood & affect appropriate.    Data Reviewed: I have personally reviewed following labs and imaging studies  CBC Lab Results  Component Value Date   WBC 6.1 10/04/2023   RBC 2.84 (L) 10/04/2023   HGB 8.0 (L) 10/04/2023   HCT 27.3 (L) 10/04/2023   MCV 96.1 10/04/2023   MCH 28.2 10/04/2023   PLT 205 10/04/2023   MCHC 29.3 (L) 10/04/2023   RDW 15.9 (H) 10/04/2023   LYMPHSABS 1.1 09/27/2023   MONOABS 0.6 09/27/2023   EOSABS 0.6 (H) 09/27/2023   BASOSABS 0.0 09/27/2023     Last metabolic panel Lab Results  Component Value Date   NA 137 10/04/2023   K 5.4 (H) 10/04/2023   CL 97 (L) 10/04/2023   CO2 24 10/04/2023   BUN 39 (H) 10/04/2023   CREATININE 6.45 (H) 10/04/2023   GLUCOSE 319 (H) 10/04/2023   GFRNONAA 10 (L) 10/04/2023   GFRAA 7 (L) 05/30/2020   CALCIUM  9.1 10/04/2023   PHOS 9.4 (H) 10/04/2023   PROT 7.6 09/09/2023   ALBUMIN  2.7 (L) 10/04/2023   BILITOT 0.8 09/09/2023   ALKPHOS 116 09/09/2023   AST 15 09/09/2023   ALT 12 09/09/2023   ANIONGAP 16 (H) 10/04/2023     GFR: Estimated Creatinine Clearance: 15.9 mL/min (A) (by C-G formula based on SCr of 6.45 mg/dL (H)).  No results found for this or any previous visit (from the past 240 hours).    Radiology Studies: No results found.    LOS: 26 days    Elgin Lam, MD [facility] 10/05/2023, 10:56 AM   If 7PM-7AM, please contact night-coverage [email or web address]

## 2023-10-05 NOTE — Progress Notes (Signed)
 Interventional Radiology Progress Note  43 yo male admit for sepsis, MV & RV MRSA endocarditis.   Comorbid conditions: ESRD on hemodialysis (not fully compliant), insulin -dependent type 1 diabetes, gastroparesis, Left AKA, admitted to [facility] on 07/09/23 for DKA, Sepsis, acute MCA stroke , left occipital and bilateral cerebellar strokes consistent with septic emboli, Brain abscess from MRSA (sx at [facility]), left IJ DVT, chronic anemia, HTN, PAD, right 3rd toe necrosis, behavioral changes (rx = seroquel ), pressure injury.   Discussed with Dr. Melia possible advanced techniques to replace the femoral tunneled HD catheter with upper extremity HD cath.   Hx of bilateral upper extremity venous occlusion, necessitating femoral access.    This is reasonable given his history.   Over the past week, attempt has been made several occasions to perform planning CTA chest, and the patient has declined on serial attempts.    I went to speak with the patient today again, and I cannot determine his orientation/alertness, or competence to consent for this work up (Response = Why you want to know my name and when I was born, bro?)  I did speak with his mother today, and I explained the potential procedure to replace the femoral line with upper extremity, the reason (decrease further infection risk), and the need for anesthesia - particularly given that it is uncertain of his willingness to participate.  His mother did confirm that she would speak with him about Ct imaging and the potential procedure, however, she also said that she is not his POA, and that he need to decide for himself.    Once his mother speaks to him again regarding this, we can attempt again for CT.  If he ends up being a candidate for advanced techniques for catheter placement, this can be performed electively, potentially after his placement at facility.   Signed,  Ami RAMAN. Alona, DO, ABVM, RPVI

## 2023-10-05 NOTE — Plan of Care (Signed)
  Problem: Education: Goal: Knowledge of General Education information will improve Description: Including pain rating scale, medication(s)/side effects and non-pharmacologic comfort measures Outcome: Progressing   Problem: Health Behavior/Discharge Planning: Goal: Ability to manage health-related needs will improve Outcome: Progressing   Problem: Clinical Measurements: Goal: Ability to maintain clinical measurements within normal limits will improve Outcome: Progressing Goal: Will remain free from infection Outcome: Progressing Goal: Diagnostic test results will improve Outcome: Progressing Goal: Cardiovascular complication will be avoided Outcome: Progressing   Problem: Activity: Goal: Risk for activity intolerance will decrease Outcome: Progressing   Problem: Coping: Goal: Level of anxiety will decrease Outcome: Progressing   Problem: Skin Integrity: Goal: Risk for impaired skin integrity will decrease Outcome: Progressing   Problem: Education: Goal: Ability to describe self-care measures that may prevent or decrease complications (Diabetes Survival Skills Education) will improve Outcome: Progressing Goal: Individualized Educational Video(s) Outcome: Progressing   Problem: Coping: Goal: Ability to adjust to condition or change in health will improve Outcome: Progressing   Problem: Fluid Volume: Goal: Ability to maintain a balanced intake and output will improve Outcome: Progressing   Problem: Health Behavior/Discharge Planning: Goal: Ability to identify and utilize available resources and services will improve Outcome: Progressing Goal: Ability to manage health-related needs will improve Outcome: Progressing   Problem: Metabolic: Goal: Ability to maintain appropriate glucose levels will improve Outcome: Progressing   Problem: Skin Integrity: Goal: Risk for impaired skin integrity will decrease Outcome: Progressing   Problem: Tissue Perfusion: Goal:  Adequacy of tissue perfusion will improve Outcome: Progressing

## 2023-10-05 NOTE — Progress Notes (Signed)
 OT Cancellation Note  Patient Details Name: [name] MRN: [id number] DOB: 1981-07-27   Cancelled Treatment:    Reason Eval/Treat Not Completed: Fatigue/lethargy limiting ability to participate;Other (comment) (pt sleeping heavily). OT will follow up next available time  [name] 10/05/2023, 2:11 PM

## 2023-10-05 NOTE — Progress Notes (Signed)
 Physical Therapy Treatment Patient Details Name: [name] MRN: [id number] DOB: [birth year] Today's Date: 10/05/2023   History of Present Illness 43 y.o. male presents to [facility] 09/09/23 as a transfer from [facility] for further management of endocarditis. Pt originally was admitted to [facility] 07/09/23 for DKA and sepsis after being found on the ground. 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 10/17 pt had a R MCA, L occipital, and B cerebellum CVA w/ septic emboli. ICU admit 10/18 w/ intubation 10/18-10/24. Pt was transferred to [facility] for MV repair on 11/29. At [facility], pt had cardiac cath w/ non obstructive disease, SAH, a craniotomy w/ abscess drainage on 12/4, and R third toe distal necrosis. PMH: chronic L AKA, MRSA, IDDM, ESRD on HD MWF, HTN, TEE positive for vegetation on multiple valves, cellulitis, medical noncompliance, narcotic dependence, DM with gastroparesis, L internal jugular DVT.    PT Comments  Pt received in bed, agreeable to participation in therapy. Pt performed LE exercises in supine prior to transfer to EOB. He required max assist supine<>sit. Poor sitting balance EOB x 5 minutes, with LOB posteriorly. Pt returned to bed at end of session. Mod assist rolling L and max assist rolling R. Current POC remains appropriate.     If plan is discharge home, recommend the following: Two people to help with walking and/or transfers;Assist for transportation;Supervision due to cognitive status;Help with stairs or ramp for entrance;Two people to help with bathing/dressing/bathroom;Assistance with cooking/housework;Direct supervision/assist for financial management;Direct supervision/assist for medications management   Can travel by private vehicle     No  Equipment Recommendations  Hoyer lift;Wheelchair (measurements PT);Wheelchair cushion (measurements PT);Other (comment)    Recommendations for Other Services       Precautions / Restrictions  Precautions Precautions: Fall;Other (comment) Precaution Comments: Contact precs; L AKA, L hemiparesis and edema, L neglect, bowel incontinence. L femoral HD cath Restrictions Other Position/Activity Restrictions: Pt has sling for LUE when mobilizing OOB; LLE prosthetic not fitting     Mobility  Bed Mobility Overal bed mobility: Needs Assistance Bed Mobility: Rolling, Sit to Supine, Supine to Sit Rolling: Mod assist, Max assist   Supine to sit: Max assist, HOB elevated, Used rails Sit to supine: Max assist   General bed mobility comments: mod assist rolling L, max assist rolling R. Good active participation from pt using RUE to pull/push for assist during mobility.    Transfers                   General transfer comment: Pt agreeable to EOB only.    Ambulation/Gait                   Stairs             Wheelchair Mobility     Tilt Bed    Modified Rankin (Stroke Patients Only)       Balance Overall balance assessment: Needs assistance Sitting-balance support: Single extremity supported, Feet unsupported Sitting balance-[name]: Poor Sitting balance - Comments: Pt sat EOB x 5 minutes. Primarily CGA, but 3 episodes of LOB posteriorly requiring mod assist to regain balance.                                    Cognition Arousal: Alert Behavior During Therapy: Flat affect Overall Cognitive Status: Impaired/Different from baseline Area of Impairment: Attention, Memory, Following commands, Safety/judgement, Awareness, Problem solving  Orientation Level: Disoriented to, Time, Situation Current Attention Level: Sustained Memory: Decreased short-term memory Following Commands: Follows one step commands with increased time Safety/Judgement: Decreased awareness of safety, Decreased awareness of deficits Awareness: Emergent Problem Solving: Slow processing, Difficulty sequencing, Requires verbal cues, Requires  tactile cues, Decreased initiation General Comments: tangential        Exercises General Exercises - Lower Extremity Gluteal Sets: AROM, Both, 10 reps, Supine Heel Slides: AROM, Right, 10 reps, Supine Hip ABduction/ADduction: AROM, Right, 10 reps, Supine Straight Leg Raises: AROM, Right, 5 reps, Supine Hip Flexion/Marching: AROM, Left, 5 reps, Supine    General Comments General comments (skin integrity, edema, etc.): HR 109 sitting EOB      Pertinent Vitals/Pain Pain Assessment Pain Assessment: Faces Faces Pain Scale: Hurts a little bit Pain Location: buttocks Pain Descriptors / Indicators: Discomfort Pain Intervention(s): Monitored during session, Repositioned    Home Living                          Prior Function            PT Goals (current goals can now be found in the care plan section) Acute Rehab PT Goals Patient Stated Goal: rehab at SNF Progress towards PT goals: Progressing toward goals    Frequency    Min 1X/week      PT Plan      Co-evaluation              AM-PAC PT 6 Clicks Mobility   Outcome Measure  Help needed turning from your back to your side while in a flat bed without using bedrails?: A Lot Help needed moving from lying on your back to sitting on the side of a flat bed without using bedrails?: Total Help needed moving to and from a bed to a chair (including a wheelchair)?: Total Help needed standing up from a chair using your arms (e.g., wheelchair or bedside chair)?: Total Help needed to walk in hospital room?: Total Help needed climbing 3-5 steps with a railing? : Total 6 Click Score: 7    End of Session   Activity Tolerance: Patient tolerated treatment well Patient left: in bed;with call bell/phone within reach;with bed alarm set Nurse Communication: Mobility status;Need for lift equipment PT Visit Diagnosis: Other abnormalities of gait and mobility (R26.89);Other symptoms and signs involving the nervous system  (R29.898);Hemiplegia and hemiparesis Hemiplegia - Right/Left: Left Hemiplegia - dominant/non-dominant: Non-dominant Hemiplegia - caused by: Cerebral infarction     Time: 9065-8996 PT Time Calculation (min) (ACUTE ONLY): 29 min  Charges:    $Therapeutic Exercise: 8-22 mins $Therapeutic Activity: 8-22 mins PT General Charges $$ ACUTE PT VISIT: 1 Visit                     Sari MATSU., PT  Office # [phone]    Erven Sari Shaker 10/05/2023, 10:22 AM

## 2023-10-05 NOTE — Progress Notes (Signed)
 Patient ID: [name], male   DOB: [birth year], 43 y.o.   MRN: [id number] S: Pt seen in room. No /co's.   O:BP (!) 121/98   Pulse (!) 101   Temp 98 F (36.7 C) (Oral)   Resp 19   Ht 5' 7 (1.702 m)   Wt 92.9 kg   SpO2 100%   BMI 32.08 kg/m   Intake/Output Summary (Last 24 hours) at 10/05/2023 1631 Last data filed at 10/05/2023 0900 Gross per 24 hour  Intake 240 ml  Output --  Net 240 ml   Intake/Output: I/O last 3 completed shifts: In: 360 [P.O.:360] Out: 2.9 [Other:2.9]  Intake/Output this shift:  Total I/O In: 120 [P.O.:120] Out: -  Weight change:  Gen: NAD CVS: Tachy Resp: CTA Abd: +BS, soft, NT/ND Ext: s/p LAKA, 2+ LUE edema.  Recent Labs  Lab 09/29/23 1515 10/01/23 1205 10/03/23 2112 10/04/23 0235  NA 136 138  --  137  K 4.5 4.8 5.2* 5.4*  CL 96* 96*  --  97*  CO2 24 24  --  24  GLUCOSE 205* 199*  --  319*  BUN 42* 35*  --  39*  CREATININE 6.33* 6.38*  --  6.45*  ALBUMIN  2.6* 2.4*  --  2.7*  CALCIUM  9.0 9.2  --  9.1  PHOS 7.8* 8.4*  --  9.4*   Liver Function Tests: Recent Labs  Lab 09/29/23 1515 10/01/23 1205 10/04/23 0235  ALBUMIN  2.6* 2.4* 2.7*   No results for input(s): LIPASE, AMYLASE in the last 168 hours. No results for input(s): AMMONIA in the last 168 hours. CBC: Recent Labs  Lab 09/29/23 1515 10/01/23 1205 10/02/23 0901 10/04/23 0933  WBC 5.5 6.2 6.6 6.1  HGB 7.9* 7.4* 7.9* 8.0*  HCT 25.9* 24.6* 26.3* 27.3*  MCV 93.2 93.9 93.6 96.1  PLT 185 190 206 205   Cardiac Enzymes: No results for input(s): CKTOTAL, CKMB, CKMBINDEX, TROPONINI in the last 168 hours. CBG: Recent Labs  Lab 10/04/23 1703 10/04/23 2038 10/04/23 2247 10/05/23 0657 10/05/23 1157  GLUCAP 217* 371* 313* 263* 439*    Iron  Studies: No results for input(s): IRON , TIBC, TRANSFERRIN, FERRITIN in the last 72 hours. Studies/Results: No results found.  apixaban   5 mg Oral BID   bacitracin    Topical Daily   calcitRIOL   1.5 mcg Oral Q  M,W,F-HD   Chlorhexidine  Gluconate Cloth  6 each Topical Q0600   darbepoetin (ARANESP ) injection - DIALYSIS  100 mcg Subcutaneous Q Sun-1800   dextrose   1 Tube Oral Once   dextrose   25 mL Intravenous Once   gabapentin   100 mg Oral TID   Gerhardt's butt cream   Topical BID   influenza vac split trivalent PF  0.5 mL Intramuscular Tomorrow-1000   insulin  aspart  0-6 Units Subcutaneous TID WC   insulin  glargine-yfgn  2 Units Subcutaneous Daily   loratadine   10 mg Oral Daily   metoprolol  tartrate  25 mg Oral BID   midodrine   10 mg Oral Q M,W,F-HD   pantoprazole   40 mg Oral Daily   povidone-Iodine    Topical Q0600   QUEtiapine   12.5 mg Oral q morning   QUEtiapine   25 mg Oral QHS   senna-docusate  2 tablet Oral BID   sevelamer  carbonate  1,600 mg Oral TID WC   sodium zirconium cyclosilicate   10 g Oral TID    BMET    Component Value Date/Time   NA 137 10/04/2023 0235   NA 138 08/22/2014 0447  K 5.4 (H) 10/04/2023 0235   K 4.0 08/22/2014 0447   CL 97 (L) 10/04/2023 0235   CL 100 08/22/2014 0447   CO2 24 10/04/2023 0235   CO2 30 08/22/2014 0447   GLUCOSE 319 (H) 10/04/2023 0235   GLUCOSE 88 08/22/2014 0447   BUN 39 (H) 10/04/2023 0235   BUN 14 08/22/2014 0447   CREATININE 6.45 (H) 10/04/2023 0235   CREATININE 5.00 (H) 08/22/2014 0447   CALCIUM  9.1 10/04/2023 0235   CALCIUM  8.2 (L) 08/22/2014 0447   GFRNONAA 10 (L) 10/04/2023 0235   GFRNONAA 14 (L) 08/22/2014 0447   GFRNONAA 5 (L) 06/19/2014 0937   GFRAA 7 (L) 05/30/2020 1923   GFRAA 17 (L) 08/22/2014 0447   GFRAA 6 (L) 06/19/2014 0937   CBC    Component Value Date/Time   WBC 6.1 10/04/2023 0933   RBC 2.84 (L) 10/04/2023 0933   HGB 8.0 (L) 10/04/2023 0933   HGB 9.7 (L) 08/22/2014 0447   HCT 27.3 (L) 10/04/2023 0933   HCT 30.0 (L) 08/22/2014 0447   PLT 205 10/04/2023 0933   PLT 158 08/22/2014 0447   MCV 96.1 10/04/2023 0933   MCV 97 08/22/2014 0447   MCH 28.2 10/04/2023 0933   MCHC 29.3 (L) 10/04/2023 0933   RDW  15.9 (H) 10/04/2023 0933   RDW 15.3 (H) 08/22/2014 0447   LYMPHSABS 1.1 09/27/2023 0250   LYMPHSABS 1.7 08/22/2014 0447   MONOABS 0.6 09/27/2023 0250   MONOABS 0.8 08/22/2014 0447   EOSABS 0.6 (H) 09/27/2023 0250   EOSABS 0.4 08/22/2014 0447   BASOSABS 0.0 09/27/2023 0250   BASOSABS 0.1 08/22/2014 0447    OP HD: [facility] MWF  4h   400/1.5  77kg  L fem TDC 2/3 bath  Heparin  none   Assessment/ Plan: 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 Not a candidate for surgery here. SP line holiday w/ new TDC placed 10/18. Continues on IV vanc upon return from [facility] . Adjusted plan per ID pharm is 750mg  MWF through 10/27/23. Hyperkalemia - Pt not adhering to renal diet though sign on door with specific instructions for dietary.  Follow daily labs - K mid 4s-  low 5s now, cont TID lokelma  (but he is only getting 1-2 doses per day) for now. Will consider 1K bath for 1-2 hrs three times per week.  SP I&D brain abscess: s/p abscess drainage 12/04 at [facility]. Mentation improved. 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 HD today.  Volume excess: L arm + edema. Wts are up 13kg if correct. Noncompliant w/ diet.  UF as tolerated with HD Acute LUE DVT: w/ sig LUE edema. Cont eliquis .  Anemia: Hb 8-9s, on ESA. Hold iron  infusion while on IV ABX. 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 Cont binders w/ meals.  Hypertension: BP on low side recently, on low dose BB w h/o atrial arrhythmia.   HD access:  L  femoral TDC placed on 10/18. 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 Type 1 DM: per primary.  Drop in glucose this morning.  Had been running in the 200's.   Dispo - tolerated full HD 12/27 in chair. Cont efforts to dialyze in chair.  Appears SNF being pursued, SW assisting.   [name]  MD  [facility] 10/05/2023, 4:31 PM  Recent Labs  Lab 10/01/23 1205 10/02/23 0901 10/03/23 2112 10/04/23 0235 10/04/23 0933   HGB  7.4* 7.9*  --   --  8.0*  ALBUMIN  2.4*  --   --  2.7*  --   CALCIUM  9.2  --   --  9.1  --   PHOS 8.4*  --   --  9.4*  --   CREATININE 6.38*  --   --  6.45*  --   K 4.8  --  5.2* 5.4*  --     Inpatient medications:  apixaban   5 mg Oral BID   bacitracin    Topical Daily   calcitRIOL   1.5 mcg Oral Q M,W,F-HD   Chlorhexidine  Gluconate Cloth  6 each Topical Q0600   darbepoetin (ARANESP ) injection - DIALYSIS  100 mcg Subcutaneous Q Sun-1800   dextrose   1 Tube Oral Once   dextrose   25 mL Intravenous Once   gabapentin   100 mg Oral TID   Gerhardt's butt cream   Topical BID   influenza vac split trivalent PF  0.5 mL Intramuscular Tomorrow-1000   insulin  aspart  0-6 Units Subcutaneous TID WC   insulin  glargine-yfgn  2 Units Subcutaneous Daily   loratadine   10 mg Oral Daily   metoprolol  tartrate  25 mg Oral BID   midodrine   10 mg Oral Q M,W,F-HD   pantoprazole   40 mg Oral Daily   povidone-Iodine    Topical Q0600   QUEtiapine   12.5 mg Oral q morning   QUEtiapine   25 mg Oral QHS   senna-docusate  2 tablet Oral BID   sevelamer  carbonate  1,600 mg Oral TID WC   sodium zirconium cyclosilicate   10 g Oral TID    albumin  human Stopped (10/03/23 0701)   vancomycin  Stopped (10/04/23 1407)   acetaminophen , albumin  human, clonazepam , diphenhydrAMINE , diphenhydrAMINE -zinc  acetate, Gerhardt's butt cream, hydrALAZINE , loperamide , metoprolol  tartrate, ondansetron  (ZOFRAN ) IV, oxyCODONE , polyethylene glycol

## 2023-10-06 DIAGNOSIS — R7881 Bacteremia: Secondary | ICD-10-CM | POA: Diagnosis not present

## 2023-10-06 DIAGNOSIS — Z992 Dependence on renal dialysis: Secondary | ICD-10-CM | POA: Diagnosis not present

## 2023-10-06 DIAGNOSIS — B9562 Methicillin resistant Staphylococcus aureus infection as the cause of diseases classified elsewhere: Secondary | ICD-10-CM | POA: Diagnosis not present

## 2023-10-06 DIAGNOSIS — N186 End stage renal disease: Secondary | ICD-10-CM | POA: Diagnosis not present

## 2023-10-06 DIAGNOSIS — I38 Endocarditis, valve unspecified: Secondary | ICD-10-CM | POA: Diagnosis not present

## 2023-10-06 DIAGNOSIS — D631 Anemia in chronic kidney disease: Secondary | ICD-10-CM | POA: Diagnosis not present

## 2023-10-06 DIAGNOSIS — I330 Acute and subacute infective endocarditis: Secondary | ICD-10-CM | POA: Diagnosis not present

## 2023-10-06 DIAGNOSIS — N250 Renal osteodystrophy: Secondary | ICD-10-CM | POA: Diagnosis not present

## 2023-10-06 DIAGNOSIS — I120 Hypertensive chronic kidney disease with stage 5 chronic kidney disease or end stage renal disease: Secondary | ICD-10-CM | POA: Diagnosis not present

## 2023-10-06 LAB — CBC
HCT: 29.4 % — ABNORMAL LOW (ref 39.0–52.0)
Hemoglobin: 8.8 g/dL — ABNORMAL LOW (ref 13.0–17.0)
MCH: 27.9 pg (ref 26.0–34.0)
MCHC: 29.9 g/dL — ABNORMAL LOW (ref 30.0–36.0)
MCV: 93.3 fL (ref 80.0–100.0)
Platelets: 217 10*3/uL (ref 150–400)
RBC: 3.15 MIL/uL — ABNORMAL LOW (ref 4.22–5.81)
RDW: 15.8 % — ABNORMAL HIGH (ref 11.5–15.5)
WBC: 6 10*3/uL (ref 4.0–10.5)
nRBC: 0 % (ref 0.0–0.2)

## 2023-10-06 LAB — GLUCOSE, CAPILLARY
Glucose-Capillary: 303 mg/dL — ABNORMAL HIGH (ref 70–99)
Glucose-Capillary: 316 mg/dL — ABNORMAL HIGH (ref 70–99)
Glucose-Capillary: 171 mg/dL — ABNORMAL HIGH (ref 70–99)
Glucose-Capillary: 181 mg/dL — ABNORMAL HIGH (ref 70–99)

## 2023-10-06 LAB — BASIC METABOLIC PANEL
Anion gap: 17 — ABNORMAL HIGH (ref 5–15)
BUN: 49 mg/dL — ABNORMAL HIGH (ref 6–20)
CO2: 26 mmol/L (ref 22–32)
Calcium: 9.4 mg/dL (ref 8.9–10.3)
Chloride: 94 mmol/L — ABNORMAL LOW (ref 98–111)
Creatinine, Ser: 6.27 mg/dL — ABNORMAL HIGH (ref 0.61–1.24)
GFR, Estimated: 11 mL/min — ABNORMAL LOW (ref >60)
Glucose, Bld: 291 mg/dL — ABNORMAL HIGH (ref 70–99)
Potassium: 5.6 mmol/L — ABNORMAL HIGH (ref 3.5–5.1)
Sodium: 137 mmol/L (ref 135–145)

## 2023-10-06 MED ORDER — LIDOCAINE HCL (PF) 1 % IJ SOLN: 5.0000 mL | INTRAMUSCULAR | Status: DC | PRN

## 2023-10-06 MED ORDER — ALTEPLASE 2 MG IJ SOLR: 2.0000 mg | Freq: Once | INTRAMUSCULAR | Status: DC | PRN

## 2023-10-06 MED ORDER — NEPRO/CARBSTEADY PO LIQD: 237.0000 mL | ORAL | Status: DC | PRN

## 2023-10-06 MED ORDER — LIDOCAINE-PRILOCAINE 2.5-2.5 % EX CREA
1.0000 | TOPICAL_CREAM | CUTANEOUS | Status: DC | PRN
Start: 2023-10-06 — End: 2023-10-06

## 2023-10-06 MED ORDER — HEPARIN SODIUM (PORCINE) 1000 UNIT/ML IJ SOLN
4600.0000 [IU] | Freq: Once | INTRAMUSCULAR | Status: AC
  Administered 2023-10-06: 4600 [IU]
  Filled 2023-10-06: qty 5

## 2023-10-06 MED ORDER — HEPARIN SODIUM (PORCINE) 1000 UNIT/ML DIALYSIS: 1000.0000 [IU] | INTRAMUSCULAR | Status: DC | PRN

## 2023-10-06 MED ORDER — PENTAFLUOROPROP-TETRAFLUOROETH EX AERO
1.0000 | INHALATION_SPRAY | CUTANEOUS | Status: DC | PRN
Start: 2023-10-06 — End: 2023-10-06

## 2023-10-06 MED ORDER — ANTICOAGULANT SODIUM CITRATE 4% (200MG/5ML) IV SOLN
5.0000 mL | Status: DC | PRN
Start: 2023-10-06 — End: 2023-10-06

## 2023-10-06 MED ORDER — INSULIN GLARGINE-YFGN 100 UNIT/ML ~~LOC~~ SOLN
4.0000 [IU] | Freq: Every day | SUBCUTANEOUS | Status: DC
  Administered 2023-10-06 – 2023-10-07 (×2): 4 [IU] via SUBCUTANEOUS
  Filled 2023-10-06 (×3): qty 0.04

## 2023-10-06 NOTE — Progress Notes (Signed)
 Patient ID: [name], male   DOB: 01/28/1981, 43 y.o.   MRN: [id number] S: Pt seen in HD. No /co's.   O:BP (!) 93/45   Pulse (!) 105   Temp 98.4 F (36.9 C) (Oral)   Resp 10   Ht 5' 7 (1.702 m)   Wt 93.6 kg Comment: bed  SpO2 100%   BMI 32.32 kg/m   Intake/Output Summary (Last 24 hours) at 10/06/2023 1645 Last data filed at 10/06/2023 0900 Gross per 24 hour  Intake 720 ml  Output --  Net 720 ml   Intake/Output: I/O last 3 completed shifts: In: 480 [P.O.:480] Out: -   Intake/Output this shift:  Total I/O In: 360 [P.O.:360] Out: -  Weight change:  Gen: NAD CVS: Tachy Resp: CTA Abd: +BS, soft, NT/ND Ext: s/p LAKA, 2+ LUE edema.  Recent Labs  Lab 10/01/23 1205 10/03/23 2112 10/04/23 0235 10/06/23 0333  NA 138  --  137 137  K 4.8 5.2* 5.4* 5.6*  CL 96*  --  97* 94*  CO2 24  --  24 26  GLUCOSE 199*  --  319* 291*  BUN 35*  --  39* 49*  CREATININE 6.38*  --  6.45* 6.27*  ALBUMIN  2.4*  --  2.7*  --   CALCIUM  9.2  --  9.1 9.4  PHOS 8.4*  --  9.4*  --    Liver Function Tests: Recent Labs  Lab 10/01/23 1205 10/04/23 0235  ALBUMIN  2.4* 2.7*   No results for input(s): LIPASE, AMYLASE in the last 168 hours. No results for input(s): AMMONIA in the last 168 hours. CBC: Recent Labs  Lab 10/01/23 1205 10/02/23 0901 10/04/23 0933 10/06/23 0333  WBC 6.2 6.6 6.1 6.0  HGB 7.4* 7.9* 8.0* 8.8*  HCT 24.6* 26.3* 27.3* 29.4*  MCV 93.9 93.6 96.1 93.3  PLT 190 206 205 217   Cardiac Enzymes: No results for input(s): CKTOTAL, CKMB, CKMBINDEX, TROPONINI in the last 168 hours. CBG: Recent Labs  Lab 10/05/23 1157 10/05/23 1644 10/05/23 2247 10/06/23 0732 10/06/23 1114  GLUCAP 439* 311* 262* 303* 316*    Iron  Studies: No results for input(s): IRON , TIBC, TRANSFERRIN, FERRITIN in the last 72 hours. Studies/Results: No results found.  apixaban   5 mg Oral BID   bacitracin    Topical Daily   calcitRIOL   1.5 mcg Oral Q M,W,F-HD    Chlorhexidine  Gluconate Cloth  6 each Topical Q0600   Chlorhexidine  Gluconate Cloth  6 each Topical Q0600   darbepoetin (ARANESP ) injection - DIALYSIS  100 mcg Subcutaneous Q Sun-1800   dextrose   1 Tube Oral Once   dextrose   25 mL Intravenous Once   gabapentin   100 mg Oral TID   Gerhardt's butt cream   Topical BID   heparin  sodium (porcine)  4,600 Units Intracatheter Once   influenza vac split trivalent PF  0.5 mL Intramuscular Tomorrow-1000   insulin  aspart  0-6 Units Subcutaneous TID WC   insulin  glargine-yfgn  4 Units Subcutaneous Daily   loratadine   10 mg Oral Daily   metoprolol  tartrate  25 mg Oral BID   midodrine   10 mg Oral Q M,W,F-HD   pantoprazole   40 mg Oral Daily   povidone-Iodine    Topical Q0600   QUEtiapine   12.5 mg Oral q morning   QUEtiapine   25 mg Oral QHS   senna-docusate  2 tablet Oral BID   sevelamer  carbonate  1,600 mg Oral TID WC   sodium zirconium cyclosilicate   10 g Oral TID  BMET    Component Value Date/Time   NA 137 10/06/2023 0333   NA 138 08/22/2014 0447   K 5.6 (H) 10/06/2023 0333   K 4.0 08/22/2014 0447   CL 94 (L) 10/06/2023 0333   CL 100 08/22/2014 0447   CO2 26 10/06/2023 0333   CO2 30 08/22/2014 0447   GLUCOSE 291 (H) 10/06/2023 0333   GLUCOSE 88 08/22/2014 0447   BUN 49 (H) 10/06/2023 0333   BUN 14 08/22/2014 0447   CREATININE 6.27 (H) 10/06/2023 0333   CREATININE 5.00 (H) 08/22/2014 0447   CALCIUM  9.4 10/06/2023 0333   CALCIUM  8.2 (L) 08/22/2014 0447   GFRNONAA 11 (L) 10/06/2023 0333   GFRNONAA 14 (L) 08/22/2014 0447   GFRNONAA 5 (L) 06/19/2014 0937   GFRAA 7 (L) 05/30/2020 1923   GFRAA 17 (L) 08/22/2014 0447   GFRAA 6 (L) 06/19/2014 0937   CBC    Component Value Date/Time   WBC 6.0 10/06/2023 0333   RBC 3.15 (L) 10/06/2023 0333   HGB 8.8 (L) 10/06/2023 0333   HGB 9.7 (L) 08/22/2014 0447   HCT 29.4 (L) 10/06/2023 0333   HCT 30.0 (L) 08/22/2014 0447   PLT 217 10/06/2023 0333   PLT 158 08/22/2014 0447   MCV 93.3 10/06/2023  0333   MCV 97 08/22/2014 0447   MCH 27.9 10/06/2023 0333   MCHC 29.9 (L) 10/06/2023 0333   RDW 15.8 (H) 10/06/2023 0333   RDW 15.3 (H) 08/22/2014 0447   LYMPHSABS 1.1 09/27/2023 0250   LYMPHSABS 1.7 08/22/2014 0447   MONOABS 0.6 09/27/2023 0250   MONOABS 0.8 08/22/2014 0447   EOSABS 0.6 (H) 09/27/2023 0250   EOSABS 0.4 08/22/2014 0447   BASOSABS 0.0 09/27/2023 0250   BASOSABS 0.1 08/22/2014 0447    OP HD: [facility] MWF  4h   400/1.5  77kg  L fem TDC 2/3 bath  Heparin  none   Assessment/ Plan: 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 Not a candidate for surgery here. SP line holiday w/ new TDC placed 10/18. Continues on IV vanc upon return from [place] . Adjusted plan per ID pharm is 750mg  MWF through 10/27/23. Hyperkalemia - Pt not adhering to renal diet though sign on door with specific instructions for dietary.  Follow daily labs - K mid 4s-  low 5s now, cont TID lokelma  (but he is only getting 1-2 doses per day) for now. Will do 90 min of 1K bath w/ HD today.  SP I&D brain abscess: s/p abscess drainage 12/04 at [facility]. Mentation improved. 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 HD today.  Volume excess: L arm + edema. Wts are up 13kg if correct but bp's soft w/ HD today. Wt's might not be accurate. Noncompliant w/ diet.  UF as tolerated with HD Acute LUE DVT: w/ sig LUE edema. Cont eliquis .  Anemia: Hb 8-9s, on ESA. Hold iron  infusion while on IV ABX. 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 Cont binders w/ meals.  Hypertension: BP on low side recently, on low dose BB w h/o atrial arrhythmia.   HD access:  L  femoral TDC placed on 10/18. 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 Type 1 DM: per primary.  Drop in glucose this morning.  Had been running in the 200's.   Dispo - tolerated full HD 12/27 in chair. Cont efforts to dialyze in chair.  Appears SNF being pursued,  SW assisting.    Shane Fret  MD  [facility] 10/06/2023, 4:45 PM  Recent Labs  Lab 10/01/23 1205 10/02/23 0901 10/04/23 0235 10/04/23 0933 10/06/23 0333  HGB 7.4*   < >  --  8.0* 8.8*  ALBUMIN  2.4*  --  2.7*  --   --   CALCIUM  9.2  --  9.1  --  9.4  PHOS 8.4*  --  9.4*  --   --   CREATININE 6.38*  --  6.45*  --  6.27*  K 4.8   < > 5.4*  --  5.6*   < > = values in this interval not displayed.    Inpatient medications:  apixaban   5 mg Oral BID   bacitracin    Topical Daily   calcitRIOL   1.5 mcg Oral Q M,W,F-HD   Chlorhexidine  Gluconate Cloth  6 each Topical Q0600   Chlorhexidine  Gluconate Cloth  6 each Topical Q0600   darbepoetin (ARANESP ) injection - DIALYSIS  100 mcg Subcutaneous Q Sun-1800   dextrose   1 Tube Oral Once   dextrose   25 mL Intravenous Once   gabapentin   100 mg Oral TID   Gerhardt's butt cream   Topical BID   heparin  sodium (porcine)  4,600 Units Intracatheter Once   influenza vac split trivalent PF  0.5 mL Intramuscular Tomorrow-1000   insulin  aspart  0-6 Units Subcutaneous TID WC   insulin  glargine-yfgn  4 Units Subcutaneous Daily   loratadine   10 mg Oral Daily   metoprolol  tartrate  25 mg Oral BID   midodrine   10 mg Oral Q M,W,F-HD   pantoprazole   40 mg Oral Daily   povidone-Iodine    Topical Q0600   QUEtiapine   12.5 mg Oral q morning   QUEtiapine   25 mg Oral QHS   senna-docusate  2 tablet Oral BID   sevelamer  carbonate  1,600 mg Oral TID WC   sodium zirconium cyclosilicate   10 g Oral TID    albumin  human 25 g (10/06/23 1449)   anticoagulant sodium citrate      vancomycin  Stopped (10/04/23 1407)   acetaminophen , albumin  human, alteplase , anticoagulant sodium citrate , clonazepam , diphenhydrAMINE , diphenhydrAMINE -zinc  acetate, feeding supplement (NEPRO CARB STEADY), Gerhardt's butt cream, heparin , hydrALAZINE , lidocaine  (PF), lidocaine -prilocaine , loperamide , metoprolol  tartrate, ondansetron  (ZOFRAN ) IV, oxyCODONE , pentafluoroprop-tetrafluoroeth, polyethylene  glycol

## 2023-10-06 NOTE — Plan of Care (Signed)
  Problem: Education: Goal: Knowledge of General Education information will improve Description: Including pain rating scale, medication(s)/side effects and non-pharmacologic comfort measures Outcome: Progressing   Problem: Health Behavior/Discharge Planning: Goal: Ability to manage health-related needs will improve Outcome: Progressing   Problem: Clinical Measurements: Goal: Ability to maintain clinical measurements within normal limits will improve Outcome: Progressing Goal: Will remain free from infection Outcome: Progressing Goal: Diagnostic test results will improve Outcome: Progressing Goal: Cardiovascular complication will be avoided Outcome: Progressing   Problem: Activity: Goal: Risk for activity intolerance will decrease Outcome: Progressing   Problem: Coping: Goal: Level of anxiety will decrease Outcome: Progressing   Problem: Skin Integrity: Goal: Risk for impaired skin integrity will decrease Outcome: Progressing   Problem: Education: Goal: Ability to describe self-care measures that may prevent or decrease complications (Diabetes Survival Skills Education) will improve Outcome: Progressing Goal: Individualized Educational Video(s) Outcome: Progressing   Problem: Coping: Goal: Ability to adjust to condition or change in health will improve Outcome: Progressing   Problem: Fluid Volume: Goal: Ability to maintain a balanced intake and output will improve Outcome: Progressing   Problem: Health Behavior/Discharge Planning: Goal: Ability to identify and utilize available resources and services will improve Outcome: Progressing Goal: Ability to manage health-related needs will improve Outcome: Progressing   Problem: Metabolic: Goal: Ability to maintain appropriate glucose levels will improve Outcome: Progressing   Problem: Skin Integrity: Goal: Risk for impaired skin integrity will decrease Outcome: Progressing   Problem: Tissue Perfusion: Goal:  Adequacy of tissue perfusion will improve Outcome: Progressing

## 2023-10-06 NOTE — Progress Notes (Signed)
 While in the patient's room assessing him around 2020 the NT found a cup of pills in his bed and he was unable to say when they were given to him to take I removed the pill and placed them in the sharps container not know what pills they were since I had not been the one to give them to him.  He stated he needed to use the bedpan and I placed the bedpan under him and told him to use his call bell when he was ready to get off the bedpan.  While at the nursing station getting supplies he called out to the nursing station and the secretary informed me he said he was done and did not want the NT to come back in his room.  I went in to the room to clean him and get him off the bedpan and he began to be belligerent when I asked him to turn in the bed and began to curse and called me a bitch and said I accused him of not taking his pills which is not what I said.  My attempts de-escalate the situation by explaining I could not allow him to take pills that had been lying in his bed and I did not know when they were given to him or what pills were in the cup were futile.  I used the call bell to ask for the charge nurse to come in to assist me.  When she arrived to help me he became more belligerent and began to curse and accuse myself and Darice of saying thing we did not say.  After removing the bedpan and changing his bed pad I attempted to give him his bedtime medications and he refused them stating he wanted to take them at 10:30pm.  I made sure he had his call bell in reach and his bed was in lowest position before myself and Darice left his room.  Patient later called for his medications to be given at 10:30pm requesting to get a bath as well.  I went into his room with his medications and the NT Lynda as an escort.  I gave his medication and informed him I was not able to give him a bath at that time and I would try to make time later in the night when I could have help available.  I went in his room to get his vital  sings at 0100 with Rosaline RN as an escort at which time he refused to allow me to get his vital signs stating if I couldn't given him a bath right now he was not going to let me get his blood pressure. I ensured he had his call bell in reach and the bed was in lowest position then asked if he needed anything before I left his room and he angrily said no.  No distress noted prior to my leaving his room.

## 2023-10-06 NOTE — Progress Notes (Addendum)
 Received patient in bed to unit.  Alert and oriented.  Informed consent signed and in chart.   TX duration:3 hours and 15 minutes  Patient tolerated well.  Transported back to the room  Alert, without acute distress.  Hand-off given to patient's nurse.   Access used: Left femoral HD cath Access issues: A-V and V-A, BFR lowered to 350 due to high arterial pressures.  Total UF removed: 1.6L Medication(s) given: Tylenol , midodrine , albumin , Citriol, Vancomycin    10/06/23 1817  Vitals  Temp 98.4 F (36.9 C)  Temp Source Oral  BP 111/69  MAP (mmHg) 81  BP Location Right Leg  BP Method Automatic  Patient Position (if appropriate) Lying  Pulse Rate (!) 113  Pulse Rate Source Monitor  ECG Heart Rate (!) 113  Resp 16  Oxygen  Therapy  SpO2 99 %  O2 Device Room Air  During Treatment Monitoring  HD Safety Checks Performed Yes  Intra-Hemodialysis Comments Tx completed  Dialysis Fluid Bolus Normal Saline  Bolus Amount (mL) 300 mL  Post Treatment  Dialyzer Clearance Lightly streaked  Liters Processed 68.3  Fluid Removed (mL) 1600 mL  Tolerated HD Treatment No (Comment)  Post-Hemodialysis Comments Low BP prevented us  to get any more off.  Hemodialysis Catheter Left Femoral vein Double lumen Permanent (Tunneled)  Placement Date/Time: 07/16/23 1318   Serial / Lot #: [id number]  Expiration Date: 05/24/26  Time Out: Correct patient;Correct site;Correct procedure  Maximum sterile barrier precautions: Hand hygiene;Cap;Mask;Sterile gown;Sterile gloves;Large sterile ...  Site Condition No complications  Blue Lumen Status Flushed;Heparin  locked;Dead end cap in place  Red Lumen Status Flushed;Heparin  locked;Dead end cap in place  Purple Lumen Status N/A  Catheter fill solution Heparin  1000 units/ml  Catheter fill volume (Arterial) 2.3 cc  Catheter fill volume (Venous) 2.3  Dressing Type Transparent  Dressing Status Antimicrobial disc in place;Clean, Dry, Intact  Interventions Other  (Comment) (deaccessed)  Drainage Description None  Dressing Change Due 10/13/23  Post treatment catheter status Capped and Clamped     Camellia Brasil LPN [facility]

## 2023-10-06 NOTE — Progress Notes (Signed)
 Patient refused Stark Bray NT attempt to get vital signs and CBG this am.  Idelle Leech RN made aware.  Patient remains alert and oriented x4, speaking in full sentences and no distress is noted.

## 2023-10-06 NOTE — Progress Notes (Signed)
 This RN at pts. Bedside as pt. Has requested to see charge RN. PT. Upset that his bath has not been done and stated that that fat bitch better be in here in 10 minutes to give me a bath. She thinks she's slick, trying to wait until I go to sleep..She thinks I'm going to sleep, I'm not going to sleep until I get my bath. Pt. Continued to use derogatory language. [facility] at the bedside to speak with pt. Pt. Then stated he wanted a CHG bath. This RN offered  and was able to complete the CHG bath for the patient at this time.

## 2023-10-06 NOTE — Progress Notes (Signed)
 Patient rang his call bed and I went to see what he needed.  He began to curse at me demanding I give him a bath right now.  I informed him I could not give him a bath by myself and had to wait until assistance was available.  He began to curse me calling me a bitch and whore while using multiple other expletives to describe me and demanded to see the charge nurse and the supervisor.  I called the [facility] John RN to come and speak with the patient.  Upon John's arrival the patient was still using the multiple expletives to refer to me as I was trying to explain that I would give him a bath when help was available.  The charge nurse Darice entered the room and he continued the same and told me to get out of his room at which time Norleen went into speak with him.

## 2023-10-06 NOTE — Progress Notes (Signed)
 PROGRESS NOTE  [name]  DOB: [birth year]  PCP: Celestia Rosaline SQUIBB, NP FMW:[id number]  DOA: 09/09/2023  LOS: 27 days  Hospital Day: 28  Brief narrative: [name] is a 43 y.o. male with PMH significant for ESRD HD, DM 1, gastroparesis, neuropathy, diabetic foot s/p prior left AKA, recent MRSA endocarditis, stroke 10/11, patient was hospitalized for DKA.  Hospital course was significant for MV AND TV  MRSA endocarditis, followed by acute MCA stroke , left occipital and bilateral cerebellar strokes consistent with septic emboli,  Brain abscess from MRSA, left IJ DVT.   11/29, patient was transferred to [facility] for MV repair.  While at [facility] he underwent cardiac cath showed non obstructive disease, craniotomy with abscess drainage on 09/01/23.  Course over there was complicated by subarachnoid hemorrhage while on IV heparin  as well as right third toe distal necrosis.  He was seen by podiatry, but family wanted conservative management. Since his tertiary care needs have been completed, [facility] on [facility]/12. PTOT  recommending SNF and awaiting placement.   Subjective: Patient was seen and examined this morning.  Not in distress.  Alert, awake, oriented to place and person Chart reviewed. In the last 24 hours, No fever, tachycardic to 110s Most recent labs this morning with hemoglobin 8.8, potassium 5.6 Blood sugar level running elevated mostly over 300.  Assessment and plan: MRSA TV/MV endocarditis Positive MRSA blood cultures. Diagnosed on Transesophageal Echocardiogram. Patient managed on Vancomycin . At outside hospital, patient underwent a left heart catheterization which was significant for nonobstructive disease. Infectious disease recommendations to continue Vancomycin  IV to complete 8 weeks of treatment with an end date of 1/29. Recommendation for outpatient ID follow-up with appointment scheduled for 10/12/2023.   Prostate fluid  collection/abscess Urology consulted at outside hospital and per their assessment, fluid collection was secondary to dilated seminal vesicle with no surgical intervention recommended. Infectious disease with continued concern for possible abscess. Patient covered with vancomycin  with recommendation for outpatient urology follow-up.   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 Patient evaluated by neurosurgery. Concern for abscess ruled out by neurosurgery initially. No surgical intervention per neurosurgery. However, at outside hospital, patient was evaluated by neurosurgery who performed a craniotomy for abscess drainage.   ESRD on HD Nephrology consulted and managing hemodialysis.   Hyperkalemia Improved with HD.   Anemia of chronic kidney disease Hemoglobin stable.   CKD mineral bone disease -Continue calcitriol    Acute CVA MRI confirms large right MCA branch infarct. MRA head (11/25) significant for large necrotic appearing lesion within the right frontal lobe within area of the prior right MCA territory infarct with vasogenic edema and 8 mm leftward midline shift. LDL of 34. Hemoglobin A1C of 8.9%. Transthoracic Echocardiogram (10/13) significant for an LVEF of 25-30% with concern for AV/MV abnormality with Transesophageal Echocardiogram (10/16 & 10/22) significant for MV/TV vegetations. Neurology recommendations for Eliquis . PT/OT recommendations for SNF.   Primary hypertension Patient is on metoprolol  as an outpatient. Continue metoprolol    Acute left upper extremity DVT Continue Eliquis    Right third toe necrosis Noted. Per documentation, patient/family opting for conservative management and decline surgical management.   Diabetes mellitus type 1 Poorly controlled with hyperglycemia and hypoglycemia. Hemoglobin A1C of 8.9%. Partly due to poor dietary choice. Before bedtime dosing discontinued. Currently on Semglee  2 units daily and SSI Increase  Semglee  to 4 units daily Recent Labs  Lab 10/05/23 1157 10/05/23 1644 10/05/23 2247 10/06/23 0732  10/06/23 1114  GLUCAP 439* 311* 262* 303* 316*   Gastroparesis Noted. Patient is not on medication management apart from insulin  for blood sugar control. Patient without symptoms and is tolerating his diet.   History of left BKA Noted. Patient seen by PT/OT with recommendation for SNF on discharge.   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 Patient started on Seroquel  for behavioral change management.   Pressure injury Left buttock. Not present on admission.   Mobility: Encourage ambulation  Goals of care   Code Status: Full Code     DVT prophylaxis:   apixaban  (ELIQUIS ) tablet 5 mg   Antimicrobials: IV antibiotics till 1/31 Fluid: None Consultants: Nephrology Family Communication: None at bedside  Status: Inpatient Level of care:  Progressive   Patient is from: Home Needs to continue in-hospital care: Pending clinical course.  Needs to complete antibiotic course   Diet:  Diet Order             Diet regular Room service appropriate? Yes; Fluid consistency: Thin; Fluid restriction: 1500 mL Fluid  Diet effective now                   Scheduled Meds:  apixaban   5 mg Oral BID   bacitracin    Topical Daily   calcitRIOL   1.5 mcg Oral Q M,W,F-HD   Chlorhexidine  Gluconate Cloth  6 each Topical Q0600   Chlorhexidine  Gluconate Cloth  6 each Topical Q0600   darbepoetin (ARANESP ) injection - DIALYSIS  100 mcg Subcutaneous Q Sun-1800   dextrose   1 Tube Oral Once   dextrose   25 mL Intravenous Once   gabapentin   100 mg Oral TID   Gerhardt's butt cream   Topical BID   heparin  sodium (porcine)  4,600 Units Intracatheter Once   influenza vac split trivalent PF  0.5 mL Intramuscular Tomorrow-1000   insulin  aspart  0-6 Units Subcutaneous TID WC   insulin  glargine-yfgn  4 Units Subcutaneous Daily   loratadine   10 mg Oral Daily   metoprolol  tartrate  25 mg Oral  BID   midodrine   10 mg Oral Q M,W,F-HD   pantoprazole   40 mg Oral Daily   povidone-Iodine    Topical Q0600   QUEtiapine   12.5 mg Oral q morning   QUEtiapine   25 mg Oral QHS   senna-docusate  2 tablet Oral BID   sevelamer  carbonate  1,600 mg Oral TID WC   sodium zirconium cyclosilicate   10 g Oral TID    PRN meds: acetaminophen , albumin  human, alteplase , anticoagulant sodium citrate , clonazepam , diphenhydrAMINE , diphenhydrAMINE -zinc  acetate, feeding supplement (NEPRO CARB STEADY), Gerhardt's butt cream, heparin , hydrALAZINE , lidocaine  (PF), lidocaine -prilocaine , loperamide , metoprolol  tartrate, ondansetron  (ZOFRAN ) IV, oxyCODONE , pentafluoroprop-tetrafluoroeth, polyethylene glycol   Infusions:   albumin  human 25 g (10/06/23 1449)   anticoagulant sodium citrate      vancomycin  Stopped (10/04/23 1407)    Antimicrobials: Anti-infectives (From admission, onward)    Start     Dose/Rate Route Frequency Ordered Stop   09/24/23 1200  vancomycin  (VANCOREADY) IVPB 500 mg/100 mL        500 mg 100 mL/hr over 60 Minutes Intravenous Every M-W-F (Hemodialysis) 09/21/23 1246 10/29/23 1159   09/20/23 1200  vancomycin  (VANCOCIN ) 750 mg in sodium chloride  0.9 % 250 mL IVPB  Status:  Discontinued        750 mg 265 mL/hr over 60 Minutes Intravenous Every M-W-F (Hemodialysis) 09/20/23 0446 09/21/23 1246   09/20/23 1045  vancomycin  (VANCOCIN ) 750 mg in sodium chloride  0.9 % 250 mL IVPB  750 mg 265 mL/hr over 60 Minutes Intravenous  Once 09/20/23 0959 09/20/23 1226   09/20/23 0945  vancomycin  (VANCOREADY) IVPB 750 mg/150 mL  Status:  Discontinued        750 mg 150 mL/hr over 60 Minutes Intravenous  Once 09/20/23 0855 09/20/23 0959   09/10/23 1200  vancomycin  (VANCOREADY) IVPB 750 mg/150 mL  Status:  Discontinued        750 mg 150 mL/hr over 60 Minutes Intravenous Every M-W-F (Hemodialysis) 09/09/23 2030 09/20/23 0446   09/09/23 2200  linezolid  (ZYVOX ) tablet 600 mg  Status:  Discontinued        600  mg Oral Every 12 hours 09/09/23 2035 09/10/23 0946       Objective: Vitals:   10/06/23 1430 10/06/23 1445  BP: (!) 86/69 (!) 69/37  Pulse:  (!) 109  Resp: (!) 31 18  Temp: 98.4 F (36.9 C)   SpO2: 98% 100%    Intake/Output Summary (Last 24 hours) at 10/06/2023 1508 Last data filed at 10/06/2023 0900 Gross per 24 hour  Intake 720 ml  Output --  Net 720 ml   Filed Weights   10/04/23 1307 10/05/23 0600 10/06/23 1421  Weight: 90.1 kg 92.9 kg 93.6 kg   Weight change:  Body mass index is 32.32 kg/m.   Physical Exam: General exam: Pleasant, middle-aged male Skin: No rashes, lesions or ulcers. HEENT: Atraumatic, normocephalic, no obvious bleeding Lungs: Clear to auscultation bilaterally,  CVS: S1, S2, no murmur,   GI/Abd: Soft, nontender, nondistended, bowel sound present,   CNS: Alert, awake, oriented to place and person Psychiatry: Mood appropriate,  Extremities: No pedal edema, no calf tenderness,   Data Review: I have personally reviewed the laboratory data and studies available.  F/u labs  [employer] (From admission, onward)     Start     Ordered   Signed and Held  Renal function panel  Once,   R       Question:  Specimen collection method  Answer:  Lab=Lab collect   Signed and Held   Signed and Held  Renal function panel  Once,   R       Question:  Specimen collection method  Answer:  Lab=Lab collect   Signed and Held            Total time spent in review of labs and imaging, patient evaluation, formulation of plan, documentation and communication with family: 35 minutes  Signed, Chapman Rota, MD [facility] 10/06/2023

## 2023-10-06 NOTE — Progress Notes (Signed)
 This RN at pts bedside as pt. Requested to see the Charge RN. PT. Requesting to have a bath. This RN informed pt. That she would check with his primary RN/NT about his bath. Pt. Using derogatory language while voicing his concerns about his nurse and nurse tech. Pts. RN stated she would be able to complete pts. Bath with assistance when available.

## 2023-10-06 NOTE — TOC Progression Note (Addendum)
 Transition of Care [facility]) - Progression Note    Patient Details  Name: [name] MRN: [id number] Date of Birth: 1981-07-18  Transition of Care [facility]) CM/SW Contact  [name], [place] Phone Number: 10/06/2023, 10:31 AM  Clinical Narrative:   Ins auth approved for [facility]. Approval dates 1/8 - 1/14; Level I Cert# [id number]. CSW notified treatment team.   10:46AM: Per HD coordinator, pending nephrologist and PA to  confirm appropriateness for out-pt HD. If so, will need out-pt HD center set up.    Expected Discharge Plan: Skilled Nursing Facility Barriers to Discharge: Insurance Authorization  Expected Discharge Plan and Services                                               Social Determinants of Health (SDOH) Interventions SDOH Screenings   Food Insecurity: No Food Insecurity (09/09/2023)  Housing: Unknown (09/09/2023)  Transportation Needs: No Transportation Needs (09/09/2023)  Utilities: Not At Risk (09/09/2023)  Alcohol Screen: Low Risk  (02/15/2023)  Depression (PHQ2-9): Low Risk  (02/15/2023)  Financial Resource Strain: Low Risk  (02/15/2023)  Physical Activity: Inactive (02/15/2023)  Social Connections: Socially Integrated (02/15/2023)  Stress: No Stress Concern Present (02/15/2023)  Tobacco Use: Low Risk  (09/12/2023)  Recent Concern: Tobacco Use - Medium Risk (09/01/2023)   Received from [facility]    Readmission Risk Interventions     No data to display

## 2023-10-06 NOTE — Progress Notes (Signed)
 OT Cancellation Note  Patient Details Name: [name] MRN: [id number] DOB: 1981/05/29   Cancelled Treatment:    Reason Eval/Treat Not Completed: Patient at procedure or test/ unavailable (HD)   Mateo Flow 10/06/2023, 5:24 PM

## 2023-10-06 NOTE — Progress Notes (Signed)
 Pt picked up for dialysis, report given to dialysis RN.

## 2023-10-06 NOTE — Progress Notes (Signed)
 Contacted by CSW regarding insurance approval for snf. Case discussed with nephrologist who feels pt is is appropriate for out-pt HD at d/c. Contacted [facility] and spoke to tax adviser. Clinic is currently full and pt will need to go to another clinic at d/c. Clinic manager to discuss pt's case with medical director at clinic, investigate other clinic options and return call to navigator. Update provided to nephrologist, renal PA, and CSW. Will assist as needed.   Randine Mungo Renal Navigator [phone]

## 2023-10-07 DIAGNOSIS — I120 Hypertensive chronic kidney disease with stage 5 chronic kidney disease or end stage renal disease: Secondary | ICD-10-CM | POA: Diagnosis not present

## 2023-10-07 DIAGNOSIS — D631 Anemia in chronic kidney disease: Secondary | ICD-10-CM | POA: Diagnosis not present

## 2023-10-07 DIAGNOSIS — N250 Renal osteodystrophy: Secondary | ICD-10-CM | POA: Diagnosis not present

## 2023-10-07 DIAGNOSIS — B9562 Methicillin resistant Staphylococcus aureus infection as the cause of diseases classified elsewhere: Secondary | ICD-10-CM | POA: Diagnosis not present

## 2023-10-07 DIAGNOSIS — R7881 Bacteremia: Secondary | ICD-10-CM | POA: Diagnosis not present

## 2023-10-07 DIAGNOSIS — I330 Acute and subacute infective endocarditis: Secondary | ICD-10-CM | POA: Diagnosis not present

## 2023-10-07 DIAGNOSIS — Z992 Dependence on renal dialysis: Secondary | ICD-10-CM | POA: Diagnosis not present

## 2023-10-07 DIAGNOSIS — N186 End stage renal disease: Secondary | ICD-10-CM | POA: Diagnosis not present

## 2023-10-07 DIAGNOSIS — I38 Endocarditis, valve unspecified: Secondary | ICD-10-CM | POA: Diagnosis not present

## 2023-10-07 LAB — GLUCOSE, CAPILLARY
Glucose-Capillary: 325 mg/dL — ABNORMAL HIGH (ref 70–99)
Glucose-Capillary: 334 mg/dL — ABNORMAL HIGH (ref 70–99)
Glucose-Capillary: 304 mg/dL — ABNORMAL HIGH (ref 70–99)
Glucose-Capillary: 253 mg/dL — ABNORMAL HIGH (ref 70–99)

## 2023-10-07 MED ORDER — PROSOURCE PLUS PO LIQD
30.0000 mL | Freq: Two times a day (BID) | ORAL | Status: DC
  Administered 2023-10-09 – 2023-11-24 (×59): 30 mL via ORAL
  Filled 2023-10-07 (×67): qty 30

## 2023-10-07 MED ORDER — SEVELAMER CARBONATE 800 MG PO TABS
2400.0000 mg | ORAL_TABLET | Freq: Three times a day (TID) | ORAL | Status: DC
  Administered 2023-10-07 – 2023-11-11 (×74): 2400 mg via ORAL
  Administered 2023-11-12: 800 mg via ORAL
  Administered 2023-11-12 – 2023-11-24 (×33): 2400 mg via ORAL
  Filled 2023-10-07 (×124): qty 3

## 2023-10-07 NOTE — Progress Notes (Signed)
 Pharmacy Antibiotic Note  [name] is a 43 y.o. 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 Pt was transferred to [facility] for MV repair 11/29 and craniotomy with abscess drainage on 12/4. Pharmacy has been consulted for continued Vancomycin  dosing.  Plan: Continue Vancomycin  500 mg IV post HD MWF through 10/27/23 Will monitor HD tolerance and dosing    Height: 5' 7 (170.2 cm) Weight: 95.5 kg (210 lb 8.6 oz) IBW/kg (Calculated) : 66.1  Temp (24hrs), Avg:98.5 F (36.9 C), Min:98 F (36.7 C), Max:99.8 F (37.7 C)  Recent Labs  Lab 10/01/23 1135 10/01/23 1205 10/02/23 0901 10/04/23 0235 10/04/23 0933 10/06/23 0333  WBC  --  6.2 6.6  --  6.1 6.0  CREATININE  --  6.38*  --  6.45*  --  6.27*  VANCORANDOM 15  --   --   --   --   --     Estimated Creatinine Clearance: 16.6 mL/min (A) (by C-G formula based on SCr of 6.27 mg/dL (H)).    No Known Allergies  Antimicrobials this admission: Vancomycin  10/12 >>(10/27/23) Cefepime  10/21 >> 10/23; restart 10/25 >> 10/29 Linezolid  11/27 >> 12/10   Vancomycin  levels  -10/17 preHD>>25 -10/24: VR= 16 -11/1: VR= 24 -11/8 VR 22 -11/15 VR 22  -11/22 VR 28 - held dose x1, decrease to 500mg  -11/28 VR 18 - cont 500mg  MWF 11/29 - Transferred to [facility] -12/11 VR 19.8 (at [facility]) - mostly 750mg  with HD (occasionally 500mg  instead) - 12/16 VR 25 mcg/mL- continue 750 mg iv MWF - 12/24 VR 27 mcg/mL- hold 1 dose / reduce to 500 mg iv MWF thereafter - 10/01/23 VR 15 - therapeutic    Microbiology results: 10/12 MRSA PCR >> positive 10/12 CDiff antigen +, toxin -, PCR - >> negative 10/12 BCx >> 4/4 MRSA 10/13 BCx >> Negative 10/17 BCx >> Negative 10/21 RCx >> Negative 11/26 Bcx >> negative 11/27 Bcx >> Negative  Donny Alert, PharmD, FCCM Clinical Pharmacist Please see AMION for all Pharmacists' Contact Phone Numbers 10/07/2023, 7:34 AM

## 2023-10-07 NOTE — Evaluation (Signed)
 Occupational Therapy Re-Evaluation Patient Details Name: [name] MRN: [id number] DOB: 27-Aug-1981 Today's Date: 10/07/2023   History of Present Illness 43 y.o. male presents to [facility] 09/09/23 as a transfer from [facility] for further management of endocarditis. Pt originally was admitted to [facility] 07/09/23 for DKA and sepsis after being found on the ground. 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 10/17 pt had a R MCA, L occipital, and B cerebellum CVA w/ septic emboli. ICU admit 10/18 w/ intubation 10/18-10/24. Pt was transferred to [facility] for MV repair on 11/29. At [facility], pt had cardiac cath w/ non obstructive disease, SAH, a craniotomy w/ abscess drainage on 12/4, and R third toe distal necrosis. PMH: chronic L AKA, MRSA, IDDM, ESRD on HD MWF, HTN, TEE positive for vegetation on multiple valves, cellulitis, medical noncompliance, narcotic dependence, DM with gastroparesis, L internal jugular DVT.   Clinical Impression   Pt re-evaluated this day as he had not been seen by OT since 09/10/23 due to procedures and pt refusals. Pt now presents with decreased activity tolerance, decreased sitting balance, impaired cognition with decreased ability to maintain attention to task, edematous L UE, L UE hemiparesis with no noted change in ROM or functional use since last skilled OT evaluation, and decreased safety and independence with functional tasks. Pt currently demonstrates ability to complete UB ADLs with Set up to Max assist and LB ADLs with Max to Total assist +2. Pt participated well in today's session and reports he is motivated to participate in order to return home as soon as possible. Goals updated this day to reflect pt's current functional status. Pt will benefit from continued acute skilled OT services to address deficits outlined below, decrease caregiver burden, and increase safety and independence with functional tasks. Post acute discharge, pt will benefit  from intensive inpatient skilled rehab services < 3 hours per day to maximize rehab potential.       If plan is discharge home, recommend the following: Two people to help with walking and/or transfers;A lot of help with bathing/dressing/bathroom;Assistance with cooking/housework;Assistance with feeding;Help with stairs or ramp for entrance;Assist for transportation;Direct supervision/assist for financial management;Direct supervision/assist for medications management    Functional Status Assessment  Patient has had a recent decline in their functional status and demonstrates the ability to make significant improvements in function in a reasonable and predictable amount of time.  Equipment Recommendations  Other (comment) (defer to next level of care)    Recommendations for Other Services       Precautions / Restrictions Precautions Precautions: Fall;Other (comment) Precaution Comments: Contact precs; L AKA, L hemiparesis and edema, L neglect, bowel incontinence. L femoral HD cath Restrictions Weight Bearing Restrictions Per Provider Order: No LLE Weight Bearing Per Provider Order: Non weight bearing Other Position/Activity Restrictions: Pt has sling for LUE when mobilizing OOB; LLE prosthetic not fitting      Mobility Bed Mobility Overal bed mobility: Needs Assistance Bed Mobility: Rolling, Supine to Sit, Sit to Supine Rolling: Mod assist, Max assist, Used rails   Supine to sit: Max assist, +2 for safety/equipment, Used rails, HOB elevated Sit to supine: Max assist, +2 for physical assistance, +2 for safety/equipment        Transfers Overall transfer level: Needs assistance                 General transfer comment: deferred this session for pt/therapist safety to level of assist needed sitting EOB      Balance  Overall balance assessment: Needs assistance Sitting-balance support: Single extremity supported, Feet supported (Right foot supported) Sitting  balance-[name]: Poor Sitting balance - Comments: mod assist throughout to prevent Lt and poterior lean. occasional min assist when pt corrected weight shift towards Rt/midline with verbal cues. occasional max assist to prevent Lt lean with Rt UE reaching activities. Postural control: Posterior lean, Left lateral lean                                 ADL either performed or assessed with clinical judgement   ADL Overall ADL's : Needs assistance/impaired Eating/Feeding: Set up;Sitting (with back supported)   Grooming: Set up;Sitting (with back supported)   Upper Body Bathing: Moderate assistance;Maximal assistance;Sitting;Cueing for compensatory techniques;Cueing for sequencing (with back supported)   Lower Body Bathing: Maximal assistance;+2 for safety/equipment;+2 for physical assistance;Cueing for sequencing;Cueing for compensatory techniques;Cueing for safety;Bed level   Upper Body Dressing : Moderate assistance;Cueing for compensatory techniques;Cueing for sequencing;Sitting (with back supported; with increased time)   Lower Body Dressing: Total assistance;Bed level; +2 for safety/equipment;+2 for physical assistance     Toilet Transfer Details (indicate cue type and reason): deferred this session for pt/therapist safety Toileting- Clothing Manipulation and Hygiene: Total assistance;Bed level; +2 for safety/equipment;+2 for physical assistance         General ADL Comments: Pt requiring cues to maintain attention to tasks.     Vision Baseline Vision/History: 0 No visual deficits Ability to See in Adequate Light: 0 Adequate Patient Visual Report: No change from baseline       Perception         Praxis         Pertinent Vitals/Pain Pain Assessment Pain Assessment: No/denies pain Pain Intervention(s): Limited activity within patient's tolerance, Monitored during session, Premedicated before session, Repositioned     Extremity/Trunk Assessment Upper  Extremity Assessment Upper Extremity Assessment: Right hand dominant;LUE deficits/detail (R UE strength, ROM, and coordination WFL) LUE Deficits / Details: No active movement noted, no activation of scapula. no subluxation noted. Edematous throughout UE. pt with decreased hand extension of digits. Would likely benefit from a resting hand splint. LUE Sensation: decreased light touch;decreased proprioception LUE Coordination: decreased fine motor;decreased gross motor   Lower Extremity Assessment Lower Extremity Assessment: Defer to PT evaluation       Communication Communication Communication: No apparent difficulties   Cognition Arousal: Alert Behavior During Therapy: Flat affect Overall Cognitive Status: Impaired/Different from baseline Area of Impairment: Attention, Memory, Following commands, Safety/judgement, Awareness, Problem solving                   Current Attention Level: Selective Memory: Decreased short-term memory Following Commands: Follows one step commands consistently, Follows multi-step commands inconsistently, Follows one step commands with increased time Safety/Judgement: Decreased awareness of safety, Decreased awareness of deficits Awareness: Emergent Problem Solving: Difficulty sequencing, Requires verbal cues, Slow processing, Decreased initiation General Comments: tangential     General Comments  VSS on RA    Exercises Exercises: Other exercises Other Exercises Other Exercises: Rt UE AROM activities seated EOB: forward reaching in frontal plane with High fives x 5 reps. Boxing acitivty at EOB: 1x5 &1x10 reps Rt UE jab to hit balloon. 1x10 reps Rt UE cross to hit balloon. 1x10 reps Rt UE upper cut to hit balloon.   Shoulder Instructions      Home Living Family/patient expects to be discharged to:: Private residence Living Arrangements: Parent (mother)  Available Help at Discharge: Family;Available PRN/intermittently (mother is working on  taking FMLA) Type of Home: House Home Access: Ramped entrance     Home Layout: One level     Bathroom Shower/Tub: Chief Strategy Officer: Standard Bathroom Accessibility: Yes How Accessible: Accessible via wheelchair Home Equipment: Wheelchair - Nurse, Learning Disability (2 wheels)          Prior Functioning/Environment Prior Level of Function : Needs assist;History of Falls (last six months)             Mobility Comments: prior to initial admit in 10/24, pt transerred with RW. Since hospital admissions, pt reports limited therapy with primarily staying in the bed ADLs Comments: independent prior to 10/24 admit, since admissions has required assist for bathing, dressing, iADLs        OT Problem List: Decreased strength;Decreased range of motion;Decreased activity tolerance;Impaired balance (sitting and/or standing);Decreased coordination;Decreased cognition;Decreased safety awareness;Decreased knowledge of use of DME or AE;Impaired sensation;Impaired UE functional use      OT Treatment/Interventions: Self-care/ADL training;Therapeutic exercise;DME and/or AE instruction;Therapeutic activities;Patient/family education;Balance training;Splinting;Cognitive remediation/compensation;Visual/perceptual remediation/compensation    OT Goals(Current goals can be found in the care plan section) Acute Rehab OT Goals Patient Stated Goal: to get stronger and return home OT Goal Formulation: With patient Time For Goal Achievement: 10/21/23 Potential to Achieve Goals: Good ADL Goals Pt Will Perform Grooming:  (sitting EOB for 5 or more minutes with Fair balance) Additional ADL Goal #1: Patient will complete bed mobility during/in preparation for functional tasks with Mod assist. Additional ADL Goal #2: Patient will demonstrate ability to attend to functional or therapeutic task for 5 or more minutes with no cues needed to maintain attention to task to increase safety and  independence with functional tasks. Additional ADL Goal #3: Patient will complete functional transfers with sliding board with Mod assist +2 to increase safety and independence.  OT Frequency: Min 1X/week    Co-evaluation PT/OT/SLP Co-Evaluation/Treatment: Yes Reason for Co-Treatment: For patient/therapist safety;To address functional/ADL transfers;Complexity of the patient's impairments (multi-system involvement) PT goals addressed during session: Mobility/safety with mobility;Balance;Proper use of DME;Strengthening/ROM OT goals addressed during session: ADL's and self-care;Strengthening/ROM      AM-PAC OT 6 Clicks Daily Activity     Outcome Measure Help from another person eating meals?: A Little Help from another person taking care of personal grooming?: A Little Help from another person toileting, which includes using toliet, bedpan, or urinal?: Total Help from another person bathing (including washing, rinsing, drying)?: A Lot Help from another person to put on and taking off regular upper body clothing?: A Lot Help from another person to put on and taking off regular lower body clothing?: A Lot 6 Click Score: 13   End of Session Nurse Communication: Mobility status  Activity Tolerance: Patient tolerated treatment well Patient left: in bed;with call bell/phone within reach;with bed alarm set  OT Visit Diagnosis: Other abnormalities of gait and mobility (R26.89);Other symptoms and signs involving the nervous system (R29.898);Hemiplegia and hemiparesis;Muscle weakness (generalized) (M62.81);Other symptoms and signs involving cognitive function Hemiplegia - Right/Left: Left Hemiplegia - dominant/non-dominant: Non-Dominant Hemiplegia - caused by: Cerebral infarction                Time: 8553-8480 OT Time Calculation (min): 33 min Charges:  OT General Charges $OT Visit: 1 Visit OT Evaluation $OT Re-eval: 1 Re-eval  Margarie Rockey HERO., OTR/L, MA Acute  Rehab [phone]   Margarie FORBES Horns 10/07/2023, 6:10 PM

## 2023-10-07 NOTE — Plan of Care (Signed)
   Problem: Education: Goal: Knowledge of General Education information will improve Description Including pain rating scale, medication(s)/side effects and non-pharmacologic comfort measures Outcome: Progressing   Problem: Health Behavior/Discharge Planning: Goal: Ability to manage health-related needs will improve Outcome: Progressing   Problem: Clinical Measurements: Goal: Ability to maintain clinical measurements within normal limits will improve Outcome: Progressing Goal: Will remain free from infection Outcome: Progressing Goal: Diagnostic test results will improve Outcome: Progressing Goal: Cardiovascular complication will be avoided Outcome: Progressing

## 2023-10-07 NOTE — Progress Notes (Signed)
 Pt has been accepted at [facility] on TTS 10:10 chair time. Pt can start on Tues, Jan 14 and will need to arrive at 9:30 am to complete paperwork prior to treatment. Plan will be for pt to tx back to [facility] once a chair is available. Update provided to attending, nephrologist, renal PA, CSW, and pt's RN. Arrangements added to AVS as well. SNF will need to send hoyer pad/sling with pt to HD so pt can be transferred from w/c to HD chair. CSW made aware of this info to provide to snf. Will assist as needed.   Randine Mungo Renal Navigator [phone]

## 2023-10-07 NOTE — Plan of Care (Signed)
  Problem: Education: Goal: Knowledge of General Education information will improve Description: Including pain rating scale, medication(s)/side effects and non-pharmacologic comfort measures Outcome: Progressing   Problem: Health Behavior/Discharge Planning: Goal: Ability to manage health-related needs will improve Outcome: Progressing   Problem: Clinical Measurements: Goal: Ability to maintain clinical measurements within normal limits will improve Outcome: Progressing Goal: Will remain free from infection Outcome: Progressing Goal: Diagnostic test results will improve Outcome: Progressing Goal: Cardiovascular complication will be avoided Outcome: Progressing   Problem: Activity: Goal: Risk for activity intolerance will decrease Outcome: Progressing   Problem: Coping: Goal: Level of anxiety will decrease Outcome: Progressing   Problem: Skin Integrity: Goal: Risk for impaired skin integrity will decrease Outcome: Progressing   Problem: Education: Goal: Ability to describe self-care measures that may prevent or decrease complications (Diabetes Survival Skills Education) will improve Outcome: Progressing Goal: Individualized Educational Video(s) Outcome: Progressing   Problem: Coping: Goal: Ability to adjust to condition or change in health will improve Outcome: Progressing   Problem: Fluid Volume: Goal: Ability to maintain a balanced intake and output will improve Outcome: Progressing   Problem: Health Behavior/Discharge Planning: Goal: Ability to identify and utilize available resources and services will improve Outcome: Progressing Goal: Ability to manage health-related needs will improve Outcome: Progressing   Problem: Metabolic: Goal: Ability to maintain appropriate glucose levels will improve Outcome: Progressing   Problem: Skin Integrity: Goal: Risk for impaired skin integrity will decrease Outcome: Progressing   Problem: Tissue Perfusion: Goal:  Adequacy of tissue perfusion will improve Outcome: Progressing

## 2023-10-07 NOTE — Progress Notes (Signed)
 Physical Therapy Treatment Patient Details Name: CARROL BONDAR MRN: [id number] DOB: Sep 08, 1981 Today's Date: 10/07/2023   History of Present Illness 43 y.o. male presents to [facility] 09/09/23 as a transfer from [facility] for further management of endocarditis. Pt originally was admitted to [facility] 07/09/23 for DKA and sepsis after being found on the ground. 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 10/17 pt had a R MCA, L occipital, and B cerebellum CVA w/ septic emboli. ICU admit 10/18 w/ intubation 10/18-10/24. Pt was transferred to [facility] for MV repair on 11/29. [facility], pt had cardiac cath w/ non obstructive disease, SAH, a craniotomy w/ abscess drainage on 12/4, and R third toe distal necrosis. PMH: chronic L AKA, MRSA, IDDM, ESRD on HD MWF, HTN, TEE positive for vegetation on multiple valves, cellulitis, medical noncompliance, narcotic dependence, DM with gastroparesis, L internal jugular DVT.    PT Comments  Patient seen for co-treat with OT to facilitate seated balance and functional reaching/weight shifting at EOB. Pt requires max assist +2 for bed mobility and mod assist at EOB with support on Lt side for seated balance. Pt fluctuating from mod assist to min assist when pt cued to weight shift Rt, pt often using Rt UE to pull self Rt vs trunk engagement to initiate. During seated Rt UE reaching challenges pt sometimes with uncontrolled Lt weight shift. Pt spent ~20 minutes at EOB participating in activities, then once fatigued returned to supine in bed with Max +2 assist. Patient will benefit from continued inpatient follow up therapy, <3 hours/day. Will continue to progress pt as able during acute stay.    If plan is discharge home, recommend the following: Two people to help with walking and/or transfers;Assist for transportation;Supervision due to cognitive status;Help with stairs or ramp for entrance;Two people to help with bathing/dressing/bathroom;Assistance  with cooking/housework;Direct supervision/assist for financial management;Direct supervision/assist for medications management   Can travel by private vehicle     No  Equipment Recommendations  Hoyer lift;Wheelchair (measurements PT);Wheelchair cushion (measurements PT);Other (comment)    Recommendations for Other Services       Precautions / Restrictions Precautions Precautions: Fall;Other (comment) Precaution Comments: Contact precs; L AKA, L hemiparesis and edema, L neglect, bowel incontinence. L femoral HD cath Restrictions Weight Bearing Restrictions Per Provider Order: No LLE Weight Bearing Per Provider Order: Non weight bearing Other Position/Activity Restrictions: Pt has sling for LUE when mobilizing OOB; LLE prosthetic not fitting     Mobility  Bed Mobility Overal bed mobility: Needs Assistance Bed Mobility: Rolling, Supine to Sit, Sit to Supine Rolling: Mod assist, Max assist, Used rails   Supine to sit: Max assist, +2 for safety/equipment, Used rails, HOB elevated Sit to supine: Max assist, +2 for physical assistance, +2 for safety/equipment        Transfers                        Ambulation/Gait                   Stairs             Wheelchair Mobility     Tilt Bed    Modified Rankin (Stroke Patients Only)       Balance Overall balance assessment: Needs assistance Sitting-balance support: Single extremity supported, Feet supported (Rt foot) Sitting balance-[name]: Poor Sitting balance - Comments: mod assist throughout to prevent Lt and poterior lean. occasional min assist when pt corrected  weight shift towards Rt/midline with verbal cues. occasional max assist to prevent Lt lean with Rt UE reaching activities.                                    Cognition Arousal: Alert Behavior During Therapy: Flat affect Overall Cognitive Status: Impaired/Different from baseline                     Current  Attention Level: Selective Memory: Decreased short-term memory Following Commands: Follows one step commands consistently, Follows multi-step commands inconsistently, Follows one step commands with increased time   Awareness: Emergent Problem Solving: Difficulty sequencing, Requires verbal cues, Slow processing, Decreased initiation General Comments: tangential        Exercises Other Exercises Other Exercises: Rt UE AROM activities seated EOB: forward reaching in frontal plane with High fives x 5 reps. Boxing acitivty at EOB: 1x5 &1x10 reps Rt UE jab to hit balloon. 1x10 reps Rt UE cross to hit balloon. 1x10 reps Rt UE upper cut to hit balloon.    General Comments        Pertinent Vitals/Pain Pain Assessment Pain Score: 0-No pain Faces Pain Scale: No hurt Pain Intervention(s): Monitored during session, Repositioned    Home Living                          Prior Function            PT Goals (current goals can now be found in the care plan section) Acute Rehab PT Goals Patient Stated Goal: rehab at SNF PT Goal Formulation: With patient Time For Goal Achievement: 10/12/23 Potential to Achieve Goals: Fair Progress towards PT goals: Progressing toward goals    Frequency    Min 1X/week      PT Plan      Co-evaluation PT/OT/SLP Co-Evaluation/Treatment: Yes Reason for Co-Treatment: For patient/therapist safety;To address functional/ADL transfers;Complexity of the patient's impairments (multi-system involvement) PT goals addressed during session: Mobility/safety with mobility;Balance;Proper use of DME;Strengthening/ROM OT goals addressed during session: ADL's and self-care;Strengthening/ROM      AM-PAC PT 6 Clicks Mobility   Outcome Measure  Help needed turning from your back to your side while in a flat bed without using bedrails?: A Lot Help needed moving from lying on your back to sitting on the side of a flat bed without using bedrails?:  Total Help needed moving to and from a bed to a chair (including a wheelchair)?: Total Help needed standing up from a chair using your arms (e.g., wheelchair or bedside chair)?: Total Help needed to walk in hospital room?: Total Help needed climbing 3-5 steps with a railing? : Total 6 Click Score: 7    End of Session   Activity Tolerance: Patient tolerated treatment well Patient left: in bed;with call bell/phone within reach;with bed alarm set Nurse Communication: Mobility status;Need for lift equipment PT Visit Diagnosis: Other abnormalities of gait and mobility (R26.89);Other symptoms and signs involving the nervous system (R29.898);Hemiplegia and hemiparesis Hemiplegia - Right/Left: Left Hemiplegia - dominant/non-dominant: Non-dominant Hemiplegia - caused by: Cerebral infarction     Time: 8557-8480 PT Time Calculation (min) (ACUTE ONLY): 37 min  Charges:    $Therapeutic Activity: 8-22 mins PT General Charges $$ ACUTE PT VISIT: 1 Visit                     Vernell RODES. PT,  DPT Acute Rehabilitation Services Office [phone]  10/07/23 3:29 PM

## 2023-10-07 NOTE — Progress Notes (Signed)
 [facility] Progress Note   Subjective:  Seen in room - feels ok today. Denies CP/dyspnea. Discharge details being finalized - plan is to go to [facility]. Prior HD spot unavailable at this time - being switched to [facility] HD unit temporarily until can return to prior unit (short-term). At the new HD unit, he will be on TTS schedule, so we need to switch him here in preparation for this.  Objective Vitals:   10/06/23 2223 10/07/23 0104 10/07/23 0555 10/07/23 0842  BP: (!) 150/118 (!) 94/49 (!) 104/31   Pulse: (!) 105 (!) 102 93 98  Resp:  15 18 18   Temp:  98.2 F (36.8 C) 98 F (36.7 C) (!) 97.4 F (36.3 C)  TempSrc:  Axillary Axillary Axillary  SpO2:  95% 91% 100%  Weight:   95.5 kg   Height:       Physical Exam General: Chronically ill appearing man, NAD. Room air. Bandage to R scalp Heart: RRR; 3/6 murmur Lungs: CTA anteriorly Abdomen: soft Extremities: Trace RLE edema, L AKA without edema, LUE with 3+ tense edema Dialysis Access: L femoral [facility]  Additional Objective Labs: Basic Metabolic Panel: Recent Labs  Lab 10/01/23 1205 10/03/23 2112 10/04/23 0235 10/06/23 0333  NA 138  --  137 137  K 4.8 5.2* 5.4* 5.6*  CL 96*  --  97* 94*  CO2 24  --  24 26  GLUCOSE 199*  --  319* 291*  BUN 35*  --  39* 49*  CREATININE 6.38*  --  6.45* 6.27*  CALCIUM  9.2  --  9.1 9.4  PHOS 8.4*  --  9.4*  --    Liver Function Tests: Recent Labs  Lab 10/01/23 1205 10/04/23 0235  ALBUMIN  2.4* 2.7*   CBC: Recent Labs  Lab 10/01/23 1205 10/02/23 0901 10/04/23 0933 10/06/23 0333  WBC 6.2 6.6 6.1 6.0  HGB 7.4* 7.9* 8.0* 8.8*  HCT 24.6* 26.3* 27.3* 29.4*  MCV 93.9 93.6 96.1 93.3  PLT 190 206 205 217   CBG: Recent Labs  Lab 10/06/23 0732 10/06/23 1114 10/06/23 1802 10/06/23 1958 10/07/23 0557  GLUCAP 303* 316* 171* 181* 325*   Medications:  albumin  human 25 g (10/06/23 1449)   vancomycin  Stopped (10/06/23 1758)    apixaban   5 mg Oral BID    bacitracin    Topical Daily   calcitRIOL   1.5 mcg Oral Q M,W,F-HD   Chlorhexidine  Gluconate Cloth  6 each Topical Q0600   Chlorhexidine  Gluconate Cloth  6 each Topical Q0600   darbepoetin (ARANESP ) injection - DIALYSIS  100 mcg Subcutaneous Q Sun-1800   dextrose   1 Tube Oral Once   dextrose   25 mL Intravenous Once   gabapentin   100 mg Oral TID   Gerhardt's butt cream   Topical BID   influenza vac split trivalent PF  0.5 mL Intramuscular Tomorrow-1000   insulin  aspart  0-6 Units Subcutaneous TID WC   insulin  glargine-yfgn  4 Units Subcutaneous Daily   loratadine   10 mg Oral Daily   metoprolol  tartrate  25 mg Oral BID   midodrine   10 mg Oral Q M,W,F-HD   pantoprazole   40 mg Oral Daily   povidone-Iodine    Topical Q0600   QUEtiapine   12.5 mg Oral q morning   QUEtiapine   25 mg Oral QHS   senna-docusate  2 tablet Oral BID   sevelamer  carbonate  1,600 mg Oral TID WC   sodium zirconium cyclosilicate   10 g Oral TID  Dialysis Orders MWF - [facility] 4hr, 400/A1.5, EDW 77kg, L femoral TDC, 2K/3Ca bath, no heparin   Assessment/Plan: 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 Continues on extended Vanc course - will be on Vanc 750mcg IV q HD until 10/27/23. MRSA brain abscess: S/p transfer to [facility] and drainage 08/31/24. Acute B CVA/septic emboli Acute LUE DVT: On Eliquis  ESRD: Usual MWF schedule, will transition to TTS schedule in preparation for discharge. Next HD tomorrow 1/10, then will dialyze again on Sat 1/11 AM (shortened HD) to transition him to TTS schedule. He is chronically overloaded and runs high K, so going from Friday to Tuesday is not feasible for him. Dialysis access: Has L femoral TDC, Hx B subclavian occlusions. HTN/volume: Lots of LUE  edema, well above prior dry weight. UF as tolerated with next HD. Anemia of ESRD: Hgb 8.8 - continue Aranesp  100mcg  q Sunday while here. No Iv iron  while on abx. Secondary HPTH: CorrCa high, Phos high. Hold calcitriol , continue sevelamer  as binder but ^ dose to 3/meals. Hyperkalemia: Despite Lokelma  10g TID (albeit refusing at times), utilizing 1K bath with HD if needed. Nutrition: Alb low, adding protein supplements. T1D: Per primary. Dispo: SNF being arranged. Since has been hospitalized for so long, his outpatietn clinic temporarily fillled his prior chair - will need temporarily HD at alternative clinic until he can go back to old unit.   Izetta Boehringer, PA-C 10/07/2023, 10:19 AM  [employer]

## 2023-10-07 NOTE — TOC Progression Note (Addendum)
 Transition of Care [facility]) - Progression Note    Patient Details  Name: [name] MRN: [id number] Date of Birth: 25-May-1981  Transition of Care [facility]) CM/SW Contact  [name], [place] Phone Number: 10/07/2023, 10:55 AM  Clinical Narrative:   [name] will need to re evaluate pt dc to them at this time due to pt exhibiting behaviors per note 1/8 at 4:49AM. Per note, pt was cussing at staff and had a cup of pills that the RN disposed of.   Per [facility], [name] will be coming to introduce herself to pt and evaluate appropriateness for [facility]. [name] called CSW and stated she will call CSW when here so both can be present when she meets with pt.   Per treatment team, look at potential DC Sat 1/11 after morning HD, pending acceptance from [facility].   2:00PM: CSW met [name] w/ [name] in pts room. [name] introduced herself and provided pt with brochure to [facility]. After evaluating pt, [name] stated she will talk to [name] about accepting pt to facility.   CSW followed up with pt about visit and he stated he wants to go to over there, to [place].   Expected Discharge Plan: Skilled Nursing Facility Barriers to Discharge: Insurance Authorization  Expected Discharge Plan and Services                                               Social Determinants of Health (SDOH) Interventions SDOH Screenings   Food Insecurity: No Food Insecurity (09/09/2023)  Housing: Unknown (09/09/2023)  Transportation Needs: No Transportation Needs (09/09/2023)  Utilities: Not At Risk (09/09/2023)  Alcohol Screen: Low Risk  (02/15/2023)  Depression (PHQ2-9): Low Risk  (02/15/2023)  Financial Resource Strain: Low Risk  (02/15/2023)  Physical Activity: Inactive (02/15/2023)  Social Connections: Socially Integrated (02/15/2023)  Stress: No Stress Concern Present (02/15/2023)  Tobacco Use: Low Risk  (09/12/2023)  Recent Concern: Tobacco Use - Medium Risk (09/01/2023)   Received from [facility]     Readmission Risk Interventions     No data to display

## 2023-10-07 NOTE — Inpatient Diabetes Management (Signed)
 Inpatient Diabetes Program Recommendations  AACE/ADA: New Consensus Statement on Inpatient Glycemic Control (2015)  Target Ranges:  Prepandial:   less than 140 mg/dL      Peak postprandial:   less than 180 mg/dL (1-2 hours)      Critically ill patients:  140 - 180 mg/dL   Lab Results  Component Value Date   GLUCAP 325 (H) 10/07/2023   HGBA1C 8.9 (H) 07/10/2023    Review of Glycemic Control  Diabetes history: DM1 Outpatient Diabetes medications: Lantus  4 daily, Humalog 0-4 TID Current orders for Inpatient glycemic control: Semglee  4 daily, Novolog  0-6 TID with meals  Inpatient Diabetes Program Recommendations:    Consider increasing Semglee  to 5 daily.  Follow.  Thank you. Shona Brandy, RD, LDN, CDCES Inpatient Diabetes Coordinator [phone]

## 2023-10-07 NOTE — Progress Notes (Signed)
 PROGRESS NOTE  [name]  DOB: 1981/02/06  PCP: Celestia Rosaline SQUIBB, NP FMW:[id number]  DOA: 09/09/2023  LOS: 28 days  Hospital Day: 29  Brief narrative: [name] is a 43 y.o. male with PMH significant for ESRD HD, DM 1, gastroparesis, neuropathy, diabetic foot s/p prior left AKA, recent MRSA endocarditis, stroke 10/11, patient was hospitalized for DKA.  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   11/29, patient was transferred to [facility] for MV repair.  While at [facility] he underwent cardiac cath showed non obstructive disease, craniotomy with abscess drainage on 09/01/23.  Course over there was complicated by subarachnoid hemorrhage while on IV heparin  as well as right third toe distal necrosis.  He was seen by podiatry, but family wanted conservative management. Since his tertiary care needs have been completed, he was transferred back to [facility] for further management on 12/12. PTOT  recommending SNF and awaiting placement.   Subjective: Patient was seen and examined this morning. Not in distress. Alert, awake, oriented to place and person Able to have meaningful conversation.  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  Assessment and plan: MRSA TV/MV endocarditis S/p MV repair at [facility] -11/29 10/12, blood cultures positive for MRSA. 10/13, TTE showed LVEF of 25-30% with concern for AV/MV abnormality  10/16 and 10/22, TEE showed MV/TV vegetations.  11/29, s/p MV repair at [facility]  Currently on IV vancomycin .  Per ID recommendation, to complete 8 weeks of treatment EOT 1/29. Recommendation for outpatient ID follow-up with appointment scheduled for 10/12/2023. May need that rescheduled if patient is still remains high in the hospital. While at [name],  patient underwent a left heart catheterization which was significant for nonobstructive disease.  Acute CVA Secondary to septic emboli.   10/17, MRI showed large right MCA branch infarct.  11/25, MRA head showed significant for large necrotic appearing lesion within the right frontal lobe within area of the prior right MCA territory infarct with vasogenic edema and 8 mm leftward midline shift.  Stroke workup completed.   Neurology recommendations for Eliquis .  PT/OT recommendations for SNF.   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  12/4, while at [facility], patient was evaluated by neurosurgery who performed a craniotomy for abscess drainage. Follow-up as an outpatient  Prostate fluid collection/abscess Seen by urology at [facility].  Per their assessment, fluid collection was secondary to dilated seminal vesicle with no surgical intervention recommended. Infectious disease with continued concern for possible abscess. Patient covered with vancomycin  with recommendation for outpatient urology follow-up.  Type 1 diabetes mellitus A1c 8.9 on 07/10/2023 Currently on Semglee  40 units daily and SSI with Accu-Cheks Continue to monitor Recent Labs  Lab 10/06/23 0732 10/06/23 1114 10/06/23 1802 10/06/23 1958 10/07/23 0557  GLUCAP 303* 316* 171* 181* 325*   ESRD on HD Hypertension Hypotension with dialysis Nephrology consulted and managing hemodialysis. Currently blood pressure controlled on metoprolol  tartrate 25 mg twice daily.  Patient also getting midodrine  10 mg on dialysis days MWF Caseworker and renal navigator working on outpatient chair arrangement    Anemia of chronic kidney disease Hemoglobin stable. Recent Labs    09/29/23 1515 10/01/23 1205 10/02/23 0901 10/04/23 0933 10/06/23 0333  HGB 7.9* 7.4* 7.9* 8.0* 8.8*  MCV 93.2 93.9 93.6 96.1  93.3   Acute left upper extremity DVT Continue Eliquis    Right third toe necrosis H/o prior left  BKA Per documentation, patient/family opting for conservative management and declined surgical management. Patient was seen by PT/OT with recommendation for SNF on discharge.   Mild cognitive dysfunction Behavioral changes Likely secondary to brain abscess, craniotomy Currently stable on Seroquel    Pressure injury Left buttock. Not present on admission.   Mobility: Encourage ambulation  Goals of care   Code Status: Full Code    DVT prophylaxis:   apixaban  (ELIQUIS ) tablet 5 mg   Antimicrobials: IV antibiotics till 1/31 Fluid: None Consultants: Nephrology Family Communication: None at bedside  Status: Inpatient Level of care:  Progressive   Patient is from: Home Needs to continue in-hospital care: Pending clinical course.  Needs to complete antibiotic course   Diet:  Diet Order             Diet regular Room service appropriate? Yes; Fluid consistency: Thin; Fluid restriction: 1500 mL Fluid  Diet effective now                   Scheduled Meds:  (feeding supplement) PROSource Plus  30 mL Oral BID BM   apixaban   5 mg Oral BID   bacitracin    Topical Daily   Chlorhexidine  Gluconate Cloth  6 each Topical Q0600   Chlorhexidine  Gluconate Cloth  6 each Topical Q0600   darbepoetin (ARANESP ) injection - DIALYSIS  100 mcg Subcutaneous Q Sun-1800   dextrose   1 Tube Oral Once   dextrose   25 mL Intravenous Once   gabapentin   100 mg Oral TID   Gerhardt's butt cream   Topical BID   influenza vac split trivalent PF  0.5 mL Intramuscular Tomorrow-1000   insulin  aspart  0-6 Units Subcutaneous TID WC   insulin  glargine-yfgn  4 Units Subcutaneous Daily   loratadine   10 mg Oral Daily   metoprolol  tartrate  25 mg Oral BID   midodrine   10 mg Oral Q M,W,F-HD   pantoprazole   40 mg Oral Daily   povidone-Iodine    Topical Q0600   QUEtiapine   12.5 mg Oral q morning   QUEtiapine   25 mg Oral QHS   senna-docusate  2 tablet Oral BID   sevelamer  carbonate  2,400 mg Oral TID WC    sodium zirconium cyclosilicate   10 g Oral TID    PRN meds: acetaminophen , albumin  human, clonazepam , diphenhydrAMINE , diphenhydrAMINE -zinc  acetate, Gerhardt's butt cream, hydrALAZINE , loperamide , metoprolol  tartrate, ondansetron  (ZOFRAN ) IV, oxyCODONE , polyethylene glycol   Infusions:   albumin  human 25 g (10/06/23 1449)   vancomycin  Stopped (10/06/23 1758)    Antimicrobials: Anti-infectives (From admission, onward)    Start     Dose/Rate Route Frequency Ordered Stop   09/24/23 1200  vancomycin  (VANCOREADY) IVPB 500 mg/100 mL        500 mg 100 mL/hr over 60 Minutes Intravenous Every M-W-F (Hemodialysis) 09/21/23 1246 10/29/23 1159   09/20/23 1200  vancomycin  (VANCOCIN ) 750 mg in sodium chloride  0.9 % 250 mL IVPB  Status:  Discontinued        750 mg 265 mL/hr over 60 Minutes Intravenous Every M-W-F (Hemodialysis) 09/20/23 0446 09/21/23 1246   09/20/23 1045  vancomycin  (VANCOCIN ) 750 mg in sodium chloride  0.9 % 250 mL IVPB        750 mg 265 mL/hr over 60 Minutes Intravenous  Once 09/20/23 0959 09/20/23 1226   09/20/23 0945  vancomycin  (VANCOREADY) IVPB 750 mg/150 mL  Status:  Discontinued  750 mg 150 mL/hr over 60 Minutes Intravenous  Once 09/20/23 0855 09/20/23 0959   09/10/23 1200  vancomycin  (VANCOREADY) IVPB 750 mg/150 mL  Status:  Discontinued        750 mg 150 mL/hr over 60 Minutes Intravenous Every M-W-F (Hemodialysis) 09/09/23 2030 09/20/23 0446   09/09/23 2200  linezolid  (ZYVOX ) tablet 600 mg  Status:  Discontinued        600 mg Oral Every 12 hours 09/09/23 2035 09/10/23 0946       Objective: Vitals:   10/07/23 0555 10/07/23 0842  BP: (!) 104/31   Pulse: 93 98  Resp: 18 18  Temp: 98 F (36.7 C) (!) 97.4 F (36.3 C)  SpO2: 91% 100%    Intake/Output Summary (Last 24 hours) at 10/07/2023 1139 Last data filed at 10/07/2023 0844 Gross per 24 hour  Intake 240 ml  Output 1600 ml  Net -1360 ml   Filed Weights   10/06/23 1421 10/06/23 1853 10/07/23 0555   Weight: 93.6 kg 92 kg 95.5 kg   Weight change:  Body mass index is 32.98 kg/m.   Physical Exam: General exam: Pleasant, middle-aged male Skin: No rashes, lesions or ulcers. HEENT: Atraumatic, normocephalic, no obvious bleeding.  Right craniotomy site with bandage on Lungs: Clear to auscultation bilaterally,  CVS: S1, S2, no murmur,   GI/Abd: Soft, nontender, nondistended, bowel sound present,   CNS: Alert, awake, oriented to place and person Psychiatry: Mood appropriate,  Extremities: No pedal edema, no calf tenderness.  Prior left BKA status.  Right toe infection noted.  Data Review: I have personally reviewed the laboratory data and studies available.  F/u labs  [employer] (From admission, onward)     Start     Ordered   Signed and Held  Renal function panel  Once,   R       Question:  Specimen collection method  Answer:  Lab=Lab collect   Signed and Held   Signed and Held  Renal function panel  Once,   R       Question:  Specimen collection method  Answer:  Lab=Lab collect   Signed and Held   Signed and Held  Renal function panel  Tomorrow morning,   R       Question:  Specimen collection method  Answer:  Lab=Lab collect   Signed and Held   Signed and Held  CBC  Tomorrow morning,   R       Question:  Specimen collection method  Answer:  Lab=Lab collect   Signed and Held            Total time spent in review of labs and imaging, patient evaluation, formulation of plan, documentation and communication with family: 45 minutes  Signed, Chapman Rota, MD [facility] 10/07/2023

## 2023-10-08 DIAGNOSIS — D631 Anemia in chronic kidney disease: Secondary | ICD-10-CM | POA: Diagnosis not present

## 2023-10-08 DIAGNOSIS — I38 Endocarditis, valve unspecified: Secondary | ICD-10-CM | POA: Diagnosis not present

## 2023-10-08 DIAGNOSIS — R7881 Bacteremia: Secondary | ICD-10-CM | POA: Diagnosis not present

## 2023-10-08 DIAGNOSIS — B9562 Methicillin resistant Staphylococcus aureus infection as the cause of diseases classified elsewhere: Secondary | ICD-10-CM | POA: Diagnosis not present

## 2023-10-08 DIAGNOSIS — I330 Acute and subacute infective endocarditis: Secondary | ICD-10-CM | POA: Diagnosis not present

## 2023-10-08 DIAGNOSIS — N186 End stage renal disease: Secondary | ICD-10-CM | POA: Diagnosis not present

## 2023-10-08 DIAGNOSIS — I120 Hypertensive chronic kidney disease with stage 5 chronic kidney disease or end stage renal disease: Secondary | ICD-10-CM | POA: Diagnosis not present

## 2023-10-08 DIAGNOSIS — Z992 Dependence on renal dialysis: Secondary | ICD-10-CM | POA: Diagnosis not present

## 2023-10-08 DIAGNOSIS — N250 Renal osteodystrophy: Secondary | ICD-10-CM | POA: Diagnosis not present

## 2023-10-08 LAB — GLUCOSE, CAPILLARY
Glucose-Capillary: 210 mg/dL — ABNORMAL HIGH (ref 70–99)
Glucose-Capillary: 187 mg/dL — ABNORMAL HIGH (ref 70–99)
Glucose-Capillary: 307 mg/dL — ABNORMAL HIGH (ref 70–99)

## 2023-10-08 LAB — RENAL FUNCTION PANEL
Albumin: 3.1 g/dL — ABNORMAL LOW (ref 3.5–5.0)
Anion gap: 15 (ref 5–15)
BUN: 45 mg/dL — ABNORMAL HIGH (ref 6–20)
CO2: 26 mmol/L (ref 22–32)
Calcium: 9 mg/dL (ref 8.9–10.3)
Chloride: 95 mmol/L — ABNORMAL LOW (ref 98–111)
Creatinine, Ser: 6.02 mg/dL — ABNORMAL HIGH (ref 0.61–1.24)
GFR, Estimated: 11 mL/min — ABNORMAL LOW (ref >60)
Glucose, Bld: 189 mg/dL — ABNORMAL HIGH (ref 70–99)
Phosphorus: 9.2 mg/dL — ABNORMAL HIGH (ref 2.5–4.6)
Potassium: 5.4 mmol/L — ABNORMAL HIGH (ref 3.5–5.1)
Sodium: 136 mmol/L (ref 135–145)

## 2023-10-08 LAB — CBC
HCT: 27 % — ABNORMAL LOW (ref 39.0–52.0)
Hemoglobin: 7.9 g/dL — ABNORMAL LOW (ref 13.0–17.0)
MCH: 27.8 pg (ref 26.0–34.0)
MCHC: 29.3 g/dL — ABNORMAL LOW (ref 30.0–36.0)
MCV: 95.1 fL (ref 80.0–100.0)
Platelets: 236 10*3/uL (ref 150–400)
RBC: 2.84 MIL/uL — ABNORMAL LOW (ref 4.22–5.81)
RDW: 15.6 % — ABNORMAL HIGH (ref 11.5–15.5)
WBC: 6.2 10*3/uL (ref 4.0–10.5)
nRBC: 0 % (ref 0.0–0.2)

## 2023-10-08 MED ORDER — ALTEPLASE 2 MG IJ SOLR: 2.0000 mg | Freq: Once | INTRAMUSCULAR | Status: DC | PRN

## 2023-10-08 MED ORDER — INSULIN GLARGINE-YFGN 100 UNIT/ML ~~LOC~~ SOLN
6.0000 [IU] | Freq: Every day | SUBCUTANEOUS | Status: DC
  Administered 2023-10-08 – 2023-10-10 (×3): 6 [IU] via SUBCUTANEOUS
  Filled 2023-10-08 (×5): qty 0.06

## 2023-10-08 MED ORDER — HEPARIN SODIUM (PORCINE) 1000 UNIT/ML DIALYSIS: 1000.0000 [IU] | INTRAMUSCULAR | Status: DC | PRN

## 2023-10-08 MED ORDER — HEPARIN SODIUM (PORCINE) 1000 UNIT/ML IJ SOLN
4600.0000 [IU] | Freq: Once | INTRAMUSCULAR | Status: AC
  Administered 2023-10-08: 4600 [IU]
  Filled 2023-10-08: qty 5
  Filled 2023-10-08: qty 4.6

## 2023-10-08 MED ORDER — LIDOCAINE-PRILOCAINE 2.5-2.5 % EX CREA: 1.0000 | TOPICAL_CREAM | CUTANEOUS | Status: DC | PRN

## 2023-10-08 MED ORDER — ANTICOAGULANT SODIUM CITRATE 4% (200MG/5ML) IV SOLN
5.0000 mL | Status: DC | PRN
Start: 2023-10-08 — End: 2023-10-08

## 2023-10-08 MED ORDER — PENTAFLUOROPROP-TETRAFLUOROETH EX AERO: 1.0000 | INHALATION_SPRAY | CUTANEOUS | Status: DC | PRN

## 2023-10-08 MED ORDER — LIDOCAINE HCL (PF) 1 % IJ SOLN: 5.0000 mL | INTRAMUSCULAR | Status: DC | PRN

## 2023-10-08 NOTE — Progress Notes (Signed)
 PROGRESS NOTE  [name]  DOB: 14-Sep-1981  PCP: Shane Rosaline SQUIBB, NP FMW:[id number]  DOA: 09/09/2023  LOS: 29 days  Hospital Day: 30  Brief narrative: [name] is a 43 y.o. male with PMH significant for ESRD HD, DM 1, gastroparesis, neuropathy, diabetic foot s/p prior left AKA, recent MRSA endocarditis, stroke 10/11, patient was hospitalized for DKA.  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   11/29, patient was transferred to [facility] for MV repair.  While at [facility] he underwent cardiac cath showed non obstructive disease, craniotomy with abscess drainage on 09/01/23.  Course over there was complicated by subarachnoid hemorrhage while on IV heparin  as well as right third toe distal necrosis.  He was seen by podiatry, but family wanted conservative management. Since his tertiary care needs have been completed, he was transferred back to [facility] for further management on 12/12. PTOT  recommending SNF and awaiting placement.   Subjective: Patient was seen and examined this morning.  Lying on bed.  Not in distress but no new symptoms.   Pending placement.  Assessment and plan: MRSA TV/MV endocarditis S/p MV repair at [facility] -11/29 10/12, blood cultures positive for MRSA. 10/13, TTE showed LVEF of 25-30% with concern for AV/MV abnormality  10/16 and 10/22, TEE showed MV/TV vegetations.  11/29, s/p MV repair at [facility]  Currently on IV vancomycin .  Per ID recommendation, to complete 8 weeks of treatment EOT 1/29. Recommendation for outpatient ID follow-up with appointment scheduled for 10/12/2023. May need that rescheduled if patient is still remains high in the hospital. While at [facility], patient underwent a left heart catheterization which was significant for nonobstructive disease.  Acute CVA Secondary to  septic emboli.   10/17, MRI showed large right MCA branch infarct.  11/25, MRA head showed significant for large necrotic appearing lesion within the right frontal lobe within area of the prior right MCA territory infarct with vasogenic edema and 8 mm leftward midline shift.  Stroke workup completed.   Neurology recommendations for Eliquis .  PT/OT recommendations for SNF.   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  12/4, while at [facility], patient was evaluated by neurosurgery who performed a craniotomy for abscess drainage. Follow-up as an outpatient  Prostate fluid collection/abscess Seen by urology at [facility].  Per their assessment, fluid collection was secondary to dilated seminal vesicle with no surgical intervention recommended. Infectious disease with continued concern for possible abscess. Patient covered with vancomycin  with recommendation for outpatient urology follow-up.  Type 1 diabetes mellitus A1c 8.9 on 07/10/2023 Currently on Semglee  4 units daily and SSI with Accu-Cheks Blood sugar level running elevated.  I increased Semglee  to 6 units daily. Recent Labs  Lab 10/07/23 0557 10/07/23 1210 10/07/23 1619 10/07/23 2152 10/08/23 0626  GLUCAP 325* 334* 304* 253* 210*   ESRD on HD Hypertension Hypotension with dialysis Nephrology consulted and managing hemodialysis. Currently blood pressure controlled on metoprolol  tartrate 25 mg twice daily.  Patient also getting midodrine  10 mg on dialysis days MWF Caseworker and renal navigator working on outpatient chair arrangement    Anemia of chronic kidney disease Hemoglobin stable. Recent Labs    10/01/23 1205 10/02/23 0901 10/04/23 0933 10/06/23 0333 10/08/23 0920  HGB 7.4* 7.9* 8.0* 8.8* 7.9*  MCV 93.9 93.6 96.1 93.3 95.1   Acute left upper extremity DVT Continue  Eliquis    Right third toe necrosis H/o prior left BKA Per documentation, patient/family opting for conservative  management and declined surgical management. Patient was seen by PT/OT with recommendation for SNF on discharge.   Mild cognitive dysfunction Behavioral changes Likely secondary to brain abscess, craniotomy Currently stable on Seroquel    Pressure injury Left buttock. Not present on admission.   Mobility: Encourage ambulation  Goals of care   Code Status: Full Code    DVT prophylaxis:   apixaban  (ELIQUIS ) tablet 5 mg   Antimicrobials: IV antibiotics till 1/31 Fluid: None Consultants: Nephrology Family Communication: None at bedside.  Called and discussed with his mom this afternoon.  Status: Inpatient Level of care:  Progressive   Patient is from: Home Needs to continue in-hospital care: Pending clinical course.  Needs to complete antibiotic course.  Per caseworker, SNF will be able to take him on Monday 1/13.   Diet:  Diet Order             Diet regular Room service appropriate? Yes; Fluid consistency: Thin; Fluid restriction: 1500 mL Fluid  Diet effective now                   Scheduled Meds:  (feeding supplement) PROSource Plus  30 mL Oral BID BM   apixaban   5 mg Oral BID   bacitracin    Topical Daily   Chlorhexidine  Gluconate Cloth  6 each Topical Q0600   Chlorhexidine  Gluconate Cloth  6 each Topical Q0600   darbepoetin (ARANESP ) injection - DIALYSIS  100 mcg Subcutaneous Q Sun-1800   dextrose   1 Tube Oral Once   dextrose   25 mL Intravenous Once   gabapentin   100 mg Oral TID   Gerhardt's butt cream   Topical BID   influenza vac split trivalent PF  0.5 mL Intramuscular Tomorrow-1000   insulin  aspart  0-6 Units Subcutaneous TID WC   insulin  glargine-yfgn  6 Units Subcutaneous Daily   loratadine   10 mg Oral Daily   metoprolol  tartrate  25 mg Oral BID   midodrine   10 mg Oral Q M,W,F-HD   pantoprazole   40 mg Oral Daily   povidone-Iodine    Topical Q0600   QUEtiapine   12.5 mg Oral q morning   QUEtiapine   25 mg Oral QHS   senna-docusate  2 tablet Oral BID    sevelamer  carbonate  2,400 mg Oral TID WC   sodium zirconium cyclosilicate   10 g Oral TID    PRN meds: acetaminophen , albumin  human, clonazepam , diphenhydrAMINE , diphenhydrAMINE -zinc  acetate, Gerhardt's butt cream, hydrALAZINE , loperamide , metoprolol  tartrate, ondansetron  (ZOFRAN ) IV, oxyCODONE , polyethylene glycol   Infusions:   albumin  human 25 g (10/08/23 0943)   vancomycin  500 mg (10/08/23 1144)    Antimicrobials: Anti-infectives (From admission, onward)    Start     Dose/Rate Route Frequency Ordered Stop   09/24/23 1200  vancomycin  (VANCOREADY) IVPB 500 mg/100 mL        500 mg 100 mL/hr over 60 Minutes Intravenous Every M-W-F (Hemodialysis) 09/21/23 1246 10/29/23 1159   09/20/23 1200  vancomycin  (VANCOCIN ) 750 mg in sodium chloride  0.9 % 250 mL IVPB  Status:  Discontinued        750 mg 265 mL/hr over 60 Minutes Intravenous Every M-W-F (Hemodialysis) 09/20/23 0446 09/21/23 1246   09/20/23 1045  vancomycin  (VANCOCIN ) 750 mg in sodium chloride  0.9 % 250 mL IVPB        750 mg 265 mL/hr over 60 Minutes Intravenous  Once 09/20/23 0959 09/20/23 1226  09/20/23 0945  vancomycin  (VANCOREADY) IVPB 750 mg/150 mL  Status:  Discontinued        750 mg 150 mL/hr over 60 Minutes Intravenous  Once 09/20/23 0855 09/20/23 0959   09/10/23 1200  vancomycin  (VANCOREADY) IVPB 750 mg/150 mL  Status:  Discontinued        750 mg 150 mL/hr over 60 Minutes Intravenous Every M-W-F (Hemodialysis) 09/09/23 2030 09/20/23 0446   09/09/23 2200  linezolid  (ZYVOX ) tablet 600 mg  Status:  Discontinued        600 mg Oral Every 12 hours 09/09/23 2035 09/10/23 0946       Objective: Vitals:   10/08/23 1311 10/08/23 1328  BP: 117/87 120/73  Pulse: (!) 108 (!) 111  Resp: 17 15  Temp: 98.1 F (36.7 C)   SpO2: 100% 100%    Intake/Output Summary (Last 24 hours) at 10/08/2023 1422 Last data filed at 10/08/2023 1311 Gross per 24 hour  Intake 980 ml  Output 3.9 ml  Net 976.1 ml   Filed Weights    10/08/23 0339 10/08/23 0918 10/08/23 1330  Weight: 97.5 kg 96.3 kg 92.4 kg   Weight change: 3.9 kg Body mass index is 31.9 kg/m.   Physical Exam: General exam: Pleasant, middle-aged male Skin: No rashes, lesions or ulcers. HEENT: Atraumatic, normocephalic, no obvious bleeding.  Right craniotomy site with bandage on Lungs: Clear to auscultation bilaterally,  CVS: S1, S2, no murmur,   GI/Abd: Soft, nontender, nondistended, bowel sound present,   CNS: Alert, awake, oriented to place and person Psychiatry: Mood appropriate,  Extremities: No pedal edema, no calf tenderness.  Prior left BKA status.  Right toe infection noted.  Data Review: I have personally reviewed the laboratory data and studies available.  F/u labs  [employer] (From admission, onward)     Start     Ordered   Signed and Held  Renal function panel  Once,   R       Question:  Specimen collection method  Answer:  Lab=Lab collect   Signed and Held   Signed and Held  Renal function panel  Once,   R       Question:  Specimen collection method  Answer:  Lab=Lab collect   Signed and Held   Signed and Held  CBC  Tomorrow morning,   R       Question:  Specimen collection method  Answer:  Lab=Lab collect   Signed and Held            Total time spent in review of labs and imaging, patient evaluation, formulation of plan, documentation and communication with family: 45 minutes  Signed, Chapman Rota, MD [facility] 10/08/2023

## 2023-10-08 NOTE — Consult Note (Signed)
 [facility] Liaison Consult Note    10/08/2023  EZELL POKE 05-02-1981 [id number]  [facility] Consult   Primary Care Provider:  Celestia Rosaline SQUIBB, NP [facility] is listed for [facility] follow up  Insurance: [employer] Medicare  Discussion in morning progression meeting.  10:20 am Patient was in HD when rounding on unit. No family at the bedside.  Patient was reviewed for less than 30 days readmission extreme high risk score 28 day length of stay ongoing barriers to care  Patient was screened for hospitalization and on behalf of [facility]  Care Coordination to assess for post hospital community care needs.  Patient is being considered for a skilled nursing facility level of care for post hospital transition. Currently, for [facility] SNF for Monday.  This is NOT an affiliate facility.  Plan:  If transitions to affiliated facility, then will notify the Community [facility] RN can follow for any known or needs for transitional care needs for returning to post facility care coordination needs to return to community.  For questions or referrals, please contact:  Richerd Fish, RN, BSN, CCM [place]  [facility] Liaison Direct Dial : [phone] or secure chat Email: [email or web address]

## 2023-10-08 NOTE — Progress Notes (Signed)
 Received patient in bed to unit.  Alert and oriented.  Informed consent signed and in chart.   TX duration:3.5  Patient tolerated well.  Transported back to the room  Alert, without acute distress.  Hand-off given to patient's nurse.   Access used: Left Femoral HD Cath Access issues: A-V and V-A  Total UF removed: 3.9L Medication(s) given: 100cc bolus, Albumin , Midodrine    10/08/23 1311  Vitals  Temp 98.1 F (36.7 C)  Temp Source Oral  BP 117/87  BP Location Right Leg  BP Method Automatic  Patient Position (if appropriate) Lying  Pulse Rate (!) 108  Pulse Rate Source Monitor  ECG Heart Rate (!) 108  Resp 17  Oxygen  Therapy  SpO2 100 %  O2 Device Room Air  During Treatment Monitoring  HD Safety Checks Performed Yes  Intra-Hemodialysis Comments Tx completed;Tolerated well  Dialysis Fluid Bolus Normal Saline  Bolus Amount (mL) 300 mL  Post Treatment  Dialyzer Clearance Lightly streaked  Liters Processed 83.9  Fluid Removed (mL) 3.9 mL  Tolerated HD Treatment Yes  Hemodialysis Catheter Left Femoral vein Double lumen Permanent (Tunneled)  Placement Date/Time: 07/16/23 1318   Serial / Lot #: [id number]  Expiration Date: 05/24/26  Time Out: Correct patient;Correct site;Correct procedure  Maximum sterile barrier precautions: Hand hygiene;Cap;Mask;Sterile gown;Sterile gloves;Large sterile ...  Site Condition No complications  Blue Lumen Status Flushed;Heparin  locked;Dead end cap in place  Red Lumen Status Flushed;Heparin  locked;Dead end cap in place  Purple Lumen Status N/A  Catheter fill solution Heparin  1000 units/ml  Catheter fill volume (Arterial) 2.3 cc  Catheter fill volume (Venous) 2.3  Dressing Type Transparent  Dressing Status Antimicrobial disc in place;Clean, Dry, Intact  Interventions Other (Comment) (deaccessed)  Drainage Description None  Dressing Change Due 10/13/23  Post treatment catheter status Capped and Clamped     Camellia Brasil LPN [facility]  Dialysis Unit

## 2023-10-08 NOTE — Plan of Care (Signed)
  Problem: Education: Goal: Knowledge of General Education information will improve Description: Including pain rating scale, medication(s)/side effects and non-pharmacologic comfort measures Outcome: Progressing   Problem: Health Behavior/Discharge Planning: Goal: Ability to manage health-related needs will improve Outcome: Progressing   Problem: Clinical Measurements: Goal: Ability to maintain clinical measurements within normal limits will improve Outcome: Progressing Goal: Will remain free from infection Outcome: Progressing Goal: Diagnostic test results will improve Outcome: Progressing Goal: Cardiovascular complication will be avoided Outcome: Progressing   Problem: Activity: Goal: Risk for activity intolerance will decrease Outcome: Progressing   Problem: Coping: Goal: Level of anxiety will decrease Outcome: Progressing   Problem: Skin Integrity: Goal: Risk for impaired skin integrity will decrease Outcome: Progressing   Problem: Education: Goal: Ability to describe self-care measures that may prevent or decrease complications (Diabetes Survival Skills Education) will improve Outcome: Progressing Goal: Individualized Educational Video(s) Outcome: Progressing   Problem: Coping: Goal: Ability to adjust to condition or change in health will improve Outcome: Progressing   Problem: Fluid Volume: Goal: Ability to maintain a balanced intake and output will improve Outcome: Progressing   Problem: Health Behavior/Discharge Planning: Goal: Ability to identify and utilize available resources and services will improve Outcome: Progressing Goal: Ability to manage health-related needs will improve Outcome: Progressing   Problem: Metabolic: Goal: Ability to maintain appropriate glucose levels will improve Outcome: Progressing   Problem: Skin Integrity: Goal: Risk for impaired skin integrity will decrease Outcome: Progressing   Problem: Tissue Perfusion: Goal:  Adequacy of tissue perfusion will improve Outcome: Progressing

## 2023-10-08 NOTE — Progress Notes (Signed)
 [facility] Progress Note   Subjective:   Seen on HD - 3L UFG (limited by hypotension) and tolerating. Appears overloaded. Needs to change to TTS dialysis schedule prior to discharge, so plan is for HD tomorrow again.  Objective Vitals:   10/08/23 1000 10/08/23 1030 10/08/23 1045 10/08/23 1100  BP: 101/65 (!) 88/50 (!) 58/39 100/77  Pulse: (!) 101 (!) 104 (!) 105 (!) 105  Resp: 12 13 13 15   Temp:      TempSrc:      SpO2: 100% 100% 95% 100%  Weight:      Height:       Physical Exam General: Chronically ill appearing man, NAD. Room air. Bandage to R scalp Heart: RRR; 3/6 murmur Lungs: CTA anteriorly Abdomen: soft Extremities: Trace RLE edema, L AKA without edema, LUE with 3+ tense edema Dialysis Access: L femoral [facility]  Additional Objective Labs: Basic Metabolic Panel: Recent Labs  Lab 10/01/23 1205 10/03/23 2112 10/04/23 0235 10/06/23 0333  NA 138  --  137 137  K 4.8 5.2* 5.4* 5.6*  CL 96*  --  97* 94*  CO2 24  --  24 26  GLUCOSE 199*  --  319* 291*  BUN 35*  --  39* 49*  CREATININE 6.38*  --  6.45* 6.27*  CALCIUM  9.2  --  9.1 9.4  PHOS 8.4*  --  9.4*  --    Liver Function Tests: Recent Labs  Lab 10/01/23 1205 10/04/23 0235  ALBUMIN  2.4* 2.7*   CBC: Recent Labs  Lab 10/01/23 1205 10/02/23 0901 10/04/23 0933 10/06/23 0333  WBC 6.2 6.6 6.1 6.0  HGB 7.4* 7.9* 8.0* 8.8*  HCT 24.6* 26.3* 27.3* 29.4*  MCV 93.9 93.6 96.1 93.3  PLT 190 206 205 217   CBG: Recent Labs  Lab 10/07/23 0557 10/07/23 1210 10/07/23 1619 10/07/23 2152 10/08/23 0626  GLUCAP 325* 334* 304* 253* 210*   Medications:  albumin  human 25 g (10/08/23 0943)   anticoagulant sodium citrate      vancomycin  Stopped (10/06/23 1758)    (feeding supplement) PROSource Plus  30 mL Oral BID BM   apixaban   5 mg Oral BID   bacitracin    Topical Daily   Chlorhexidine  Gluconate Cloth  6 each Topical Q0600   Chlorhexidine  Gluconate Cloth  6 each Topical Q0600   darbepoetin  (ARANESP ) injection - DIALYSIS  100 mcg Subcutaneous Q Sun-1800   dextrose   1 Tube Oral Once   dextrose   25 mL Intravenous Once   gabapentin   100 mg Oral TID   Gerhardt's butt cream   Topical BID   heparin  sodium (porcine)  4,600 Units Intracatheter Once   influenza vac split trivalent PF  0.5 mL Intramuscular Tomorrow-1000   insulin  aspart  0-6 Units Subcutaneous TID WC   insulin  glargine-yfgn  6 Units Subcutaneous Daily   loratadine   10 mg Oral Daily   metoprolol  tartrate  25 mg Oral BID   midodrine   10 mg Oral Q M,W,F-HD   pantoprazole   40 mg Oral Daily   povidone-Iodine    Topical Q0600   QUEtiapine   12.5 mg Oral q morning   QUEtiapine   25 mg Oral QHS   senna-docusate  2 tablet Oral BID   sevelamer  carbonate  2,400 mg Oral TID WC   sodium zirconium cyclosilicate   10 g Oral TID    Dialysis Orders MWF - [facility] 4hr, 400/A1.5, EDW 77kg, L femoral TDC, 2K/3Ca bath, no heparin    Assessment/Plan: MRSA bacteremia with MV/TV endocarditis +  brain abscess:  Not a candidate for valve surgery here, S/p line holiday and brain abscess drainage (at [facility]). Continues on extended Vanc course - will be on Vanc 750mcg IV q HD until 10/27/23. MRSA brain abscess: S/p transfer to [facility] and drainage 08/31/24. Acute B CVA/septic emboli Acute LUE DVT: On Eliquis  ESRD: Usual MWF schedule, will transition to TTS schedule in preparation for discharge. HD now, then again tomorrow. Dialysis access: Has L femoral TDC, Hx B subclavian occlusions. HTN/volume: Lots of LUE  edema, well above prior dry weight. Maximizing UF (limited by BP) Anemia of ESRD: Hgb 8.8 - continue Aranesp  100mcg q Sunday while here. No Iv iron  while on abx. Secondary HPTH: CorrCa high, Phos high. Holding calcitriol , have increased sevelamer  to 3/meals. Hyperkalemia: Despite Lokelma  10g TID (albeit refusing at times), utilizing 1K bath with HD if needed. Nutrition: Alb low, continue protein supplements. T1D: Per  primary. Dispo: SNF being arranged. Since has been hospitalized for so long, his outpatietn clinic temporarily fillled his prior chair - will need temporarily HD at alternative clinic until he can go back to old unit. Arranged at [facility] clinic on TTS schedule.   Izetta Boehringer, PA-C 10/08/2023, 11:30 AM  [employer]

## 2023-10-08 NOTE — TOC Progression Note (Addendum)
 Transition of Care [facility]) - Progression Note    Patient Details  Name: [name] MRN: [id number] Date of Birth: 1980-10-03  Transition of Care [facility]) CM/SW Contact  [name], [place] Phone Number: 10/08/2023, 8:04 AM  Clinical Narrative:   Per [name] at [facility], after speaking with [name] (Director of Nursing at [facility]) bed offer for pt is available at this time.   8:34AM: Per [name], [facility], will need to look at potential DC Monday 1/13 for male bed availability. CSW updated treatment team.  11:42AM: CSW spoke with pts mother, Donzell, about potential dc  Monday to SNF. Ms. Donzell gave her verbal understanding and stated she wont be able to be there for pts dc but will see him later at the SNF.   Expected Discharge Plan: Skilled Nursing Facility Barriers to Discharge: Continued Medical Work up  Expected Discharge Plan and Services                                               Social Determinants of Health (SDOH) Interventions SDOH Screenings   Food Insecurity: No Food Insecurity (09/09/2023)  Housing: Unknown (09/09/2023)  Transportation Needs: No Transportation Needs (09/09/2023)  Utilities: Not At Risk (09/09/2023)  Alcohol Screen: Low Risk  (02/15/2023)  Depression (PHQ2-9): Low Risk  (02/15/2023)  Financial Resource Strain: Low Risk  (02/15/2023)  Physical Activity: Inactive (02/15/2023)  Social Connections: Socially Integrated (02/15/2023)  Stress: No Stress Concern Present (02/15/2023)  Tobacco Use: Low Risk  (09/12/2023)  Recent Concern: Tobacco Use - Medium Risk (09/01/2023)   Received from [facility]    Readmission Risk Interventions     No data to display

## 2023-10-09 ENCOUNTER — Inpatient Hospital Stay (HOSPITAL_COMMUNITY): Payer: Medicare HMO

## 2023-10-09 DIAGNOSIS — B9562 Methicillin resistant Staphylococcus aureus infection as the cause of diseases classified elsewhere: Secondary | ICD-10-CM | POA: Diagnosis not present

## 2023-10-09 DIAGNOSIS — I120 Hypertensive chronic kidney disease with stage 5 chronic kidney disease or end stage renal disease: Secondary | ICD-10-CM | POA: Diagnosis not present

## 2023-10-09 DIAGNOSIS — N250 Renal osteodystrophy: Secondary | ICD-10-CM | POA: Diagnosis not present

## 2023-10-09 DIAGNOSIS — J90 Pleural effusion, not elsewhere classified: Secondary | ICD-10-CM | POA: Diagnosis not present

## 2023-10-09 DIAGNOSIS — N186 End stage renal disease: Secondary | ICD-10-CM | POA: Diagnosis not present

## 2023-10-09 DIAGNOSIS — R7881 Bacteremia: Secondary | ICD-10-CM | POA: Diagnosis not present

## 2023-10-09 DIAGNOSIS — J9811 Atelectasis: Secondary | ICD-10-CM | POA: Diagnosis not present

## 2023-10-09 DIAGNOSIS — I38 Endocarditis, valve unspecified: Secondary | ICD-10-CM | POA: Diagnosis not present

## 2023-10-09 DIAGNOSIS — Z992 Dependence on renal dialysis: Secondary | ICD-10-CM | POA: Diagnosis not present

## 2023-10-09 DIAGNOSIS — I330 Acute and subacute infective endocarditis: Secondary | ICD-10-CM | POA: Diagnosis not present

## 2023-10-09 DIAGNOSIS — D631 Anemia in chronic kidney disease: Secondary | ICD-10-CM | POA: Diagnosis not present

## 2023-10-09 LAB — GLUCOSE, CAPILLARY
Glucose-Capillary: 178 mg/dL — ABNORMAL HIGH (ref 70–99)
Glucose-Capillary: 189 mg/dL — ABNORMAL HIGH (ref 70–99)
Glucose-Capillary: 203 mg/dL — ABNORMAL HIGH (ref 70–99)
Glucose-Capillary: 220 mg/dL — ABNORMAL HIGH (ref 70–99)

## 2023-10-09 MED ORDER — IOHEXOL 350 MG/ML SOLN
50.0000 mL | Freq: Once | INTRAVENOUS | Status: AC | PRN
  Administered 2023-10-09: 50 mL via INTRAVENOUS

## 2023-10-09 NOTE — Plan of Care (Signed)
   Problem: Education: Goal: Knowledge of General Education information will improve Description Including pain rating scale, medication(s)/side effects and non-pharmacologic comfort measures Outcome: Progressing

## 2023-10-09 NOTE — Plan of Care (Signed)
  Problem: Education: Goal: Knowledge of General Education information will improve Description: Including pain rating scale, medication(s)/side effects and non-pharmacologic comfort measures Outcome: Progressing   Problem: Health Behavior/Discharge Planning: Goal: Ability to manage health-related needs will improve Outcome: Progressing   Problem: Clinical Measurements: Goal: Ability to maintain clinical measurements within normal limits will improve Outcome: Progressing Goal: Will remain free from infection Outcome: Progressing Goal: Diagnostic test results will improve Outcome: Progressing Goal: Cardiovascular complication will be avoided Outcome: Progressing   Problem: Activity: Goal: Risk for activity intolerance will decrease Outcome: Progressing   Problem: Coping: Goal: Level of anxiety will decrease Outcome: Progressing   Problem: Skin Integrity: Goal: Risk for impaired skin integrity will decrease Outcome: Progressing   Problem: Education: Goal: Ability to describe self-care measures that may prevent or decrease complications (Diabetes Survival Skills Education) will improve Outcome: Progressing Goal: Individualized Educational Video(s) Outcome: Progressing   Problem: Coping: Goal: Ability to adjust to condition or change in health will improve Outcome: Progressing   Problem: Fluid Volume: Goal: Ability to maintain a balanced intake and output will improve Outcome: Progressing   Problem: Health Behavior/Discharge Planning: Goal: Ability to identify and utilize available resources and services will improve Outcome: Progressing Goal: Ability to manage health-related needs will improve Outcome: Progressing   Problem: Metabolic: Goal: Ability to maintain appropriate glucose levels will improve Outcome: Progressing   Problem: Skin Integrity: Goal: Risk for impaired skin integrity will decrease Outcome: Progressing   Problem: Tissue Perfusion: Goal:  Adequacy of tissue perfusion will improve Outcome: Progressing

## 2023-10-09 NOTE — Progress Notes (Signed)
 [name] Progress Note   Subjective:   Patient seen and examined at bedside.  Refusing to go to dialysis today.  States he was unaware of this plan.  States he would have not run yesterday and just came today instead.  Also refusing back to back treatments in general.  Denies CP, SOB, abdominal pain and n/v/d.  Upset that he has to change dialysis centers.  Reassured patient it is only temporary.   Objective Vitals:   10/09/23 0328 10/09/23 0615 10/09/23 0853 10/09/23 1324  BP: (!) 127/100  (!) 125/90 (!) 146/97  Pulse: 95 95    Resp:   20 18  Temp:   98 F (36.7 C) 98 F (36.7 C)  TempSrc:   Oral Oral  SpO2: 99% 100%    Weight:  93.2 kg    Height:       Physical Exam General:chronically ill appearing male in NAD Heart:RRR, 2/6 murmur Lungs:CTAB, nml WOB on RA Abdomen:soft, NTND, L sided flank edema Extremities:no LE edema, L AKA, LUE 2+ edema Dialysis Access: L fem [facility]   Filed Weights   10/08/23 0918 10/08/23 1330 10/09/23 0615  Weight: 96.3 kg 92.4 kg 93.2 kg    Intake/Output Summary (Last 24 hours) at 10/09/2023 1602 Last data filed at 10/09/2023 0356 Gross per 24 hour  Intake 24 ml  Output --  Net 24 ml    Additional Objective Labs: Basic Metabolic Panel: Recent Labs  Lab 10/04/23 0235 10/06/23 0333 10/08/23 0920  NA 137 137 136  K 5.4* 5.6* 5.4*  CL 97* 94* 95*  CO2 24 26 26   GLUCOSE 319* 291* 189*  BUN 39* 49* 45*  CREATININE 6.45* 6.27* 6.02*  CALCIUM  9.1 9.4 9.0  PHOS 9.4*  --  9.2*   Liver Function Tests: Recent Labs  Lab 10/04/23 0235 10/08/23 0920  ALBUMIN  2.7* 3.1*   CBC: Recent Labs  Lab 10/04/23 0933 10/06/23 0333 10/08/23 0920  WBC 6.1 6.0 6.2  HGB 8.0* 8.8* 7.9*  HCT 27.3* 29.4* 27.0*  MCV 96.1 93.3 95.1  PLT 205 217 236   Blood Culture    Component Value Date/Time   SDES BLOOD SITE NOT SPECIFIED 08/25/2023 1706   SPECREQUEST  08/25/2023 1706    BOTTLES DRAWN AEROBIC ONLY Blood Culture results may not  be optimal due to an inadequate volume of blood received in culture bottles   CULT  08/25/2023 1706    NO GROWTH 5 DAYS Performed at [facility], [address], [place], [name] [address]    REPTSTATUS 08/30/2023 FINAL 08/25/2023 1706   CBG: Recent Labs  Lab 10/08/23 0626 10/08/23 1622 10/08/23 2115 10/09/23 0617 10/09/23 1232  GLUCAP 210* 187* 307* 178* 189*   Medications:  albumin  human 60 mL/hr at 10/09/23 0356   vancomycin  Stopped (10/08/23 1500)    (feeding supplement) PROSource Plus  30 mL Oral BID BM   apixaban   5 mg Oral BID   bacitracin    Topical Daily   Chlorhexidine  Gluconate Cloth  6 each Topical Q0600   Chlorhexidine  Gluconate Cloth  6 each Topical Q0600   darbepoetin (ARANESP ) injection - DIALYSIS  100 mcg Subcutaneous Q Sun-1800   dextrose   1 Tube Oral Once   dextrose   25 mL Intravenous Once   gabapentin   100 mg Oral TID   Gerhardt's butt cream   Topical BID   influenza vac split trivalent PF  0.5 mL Intramuscular Tomorrow-1000   insulin  aspart  0-6 Units Subcutaneous TID  WC   insulin  glargine-yfgn  6 Units Subcutaneous Daily   loratadine   10 mg Oral Daily   metoprolol  tartrate  25 mg Oral BID   midodrine   10 mg Oral Q M,W,F-HD   pantoprazole   40 mg Oral Daily   povidone-Iodine    Topical Q0600   QUEtiapine   12.5 mg Oral q morning   QUEtiapine   25 mg Oral QHS   senna-docusate  2 tablet Oral BID   sevelamer  carbonate  2,400 mg Oral TID WC   sodium zirconium cyclosilicate   10 g Oral TID    MWF - [facility] 4hr, 400/A1.5, EDW 77kg, L femoral TDC, 2K/3Ca bath, no heparin    Assessment/Plan: MRSA bacteremia with MV/TV endocarditis + brain abscess:  Not a candidate for valve surgery here, S/p line holiday and brain abscess drainage (at [facility]). Continues on extended Vanc course - will be on Vanc 750mcg IV q HD until 10/27/23. MRSA brain abscess: S/p transfer to [facility] and drainage 08/31/24 .Acute B CVA/septic emboli Acute LUE DVT: On  Eliquis  ESRD: Usual MWF schedule, HD yesterday per old schedule.  Refusing HD today to get on new schedule.  With recurrent hyperkalemia and volume issues can not wait until Tuesday for outpatient HD.  Will plan for HD 1st shift Monday morning prior to discharge and can start at [facility] Tuesday or Thursday. Dialysis access: Has L femoral TDC, Hx B subclavian occlusions. HTN/volume: BP stable. Lots of LUE edema + flank edema, well above prior dry weight. Max UF as tolerated with next HD.  Suspect possible weight gain as well. Anemia of ESRD: Hgb 7.9- Increase Aranesp  150mcg q Sunday while here. No Iv iron  while on abx. Secondary HPTH: CorrCa high, Phos high. Hold calcitriol , continue sevelamer  as binder but ^ dose to 3/meals. Hyperkalemia: Despite Lokelma  10g TID (albeit refusing at times), utilizing 1K bath with HD if needed. Nutrition: Alb low, adding protein supplements. T1D: Per primary. Dispo: SNF being arranged. Since has been hospitalized for so long, his outpatient clinic temporarily fillled his prior chair - [facility] get back to [facility].   Manuelita Labella, PA-C [facility] 10/09/2023,4:02 PM  LOS: 30 days

## 2023-10-09 NOTE — Progress Notes (Signed)
 PROGRESS NOTE  [name]  DOB: 09-01-1981  PCP: Celestia Rosaline SQUIBB, NP FMW:[id number]  DOA: 09/09/2023  LOS: 30 days  Hospital Day: 31  Brief narrative: [name] is a 43 y.o. male with PMH significant for ESRD HD, DM 1, gastroparesis, neuropathy, diabetic foot s/p prior left AKA, recent MRSA endocarditis, stroke 10/11, patient was hospitalized for DKA.  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   11/29, patient was transferred to [facility] for MV repair.  While at [facility] he underwent cardiac cath showed non obstructive disease, craniotomy with abscess drainage on 09/01/23.  Course over there was complicated by subarachnoid hemorrhage while on IV heparin  as well as right third toe distal necrosis.  He was seen by podiatry, but family wanted conservative management. Since his tertiary care needs have been completed, he was transferred back to [facility] for further management on 12/12. PTOT  recommending SNF and awaiting placement.   Subjective: Patient was seen and examined this morning.  Lying on bed.  Not in distress.  No new symptoms.  Pending placement.  Tentatively planned for Monday morning.  Assessment and plan: MRSA TV/MV endocarditis S/p MV repair at [facility] -11/29 10/12, blood cultures positive for MRSA. 10/13, TTE showed LVEF of 25-30% with concern for AV/MV abnormality  10/16 and 10/22, TEE showed MV/TV vegetations.  11/29, s/p MV repair at [facility]  Currently on IV vancomycin .  Per ID recommendation, to complete 8 weeks of treatment EOT 1/29. Recommendation for outpatient ID follow-up with appointment scheduled for 10/12/2023. May need that rescheduled if patient is still remains high in the hospital. While at [facility], patient underwent a left heart catheterization which was significant for nonobstructive  disease.  Acute CVA Secondary to septic emboli.   10/17, MRI showed large right MCA branch infarct.  11/25, MRA head showed significant for large necrotic appearing lesion within the right frontal lobe within area of the prior right MCA territory infarct with vasogenic edema and 8 mm leftward midline shift.  Stroke workup completed.   Neurology recommendations for Eliquis .  PT/OT recommendations for SNF.   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  12/4, while at [facility], patient was evaluated by neurosurgery who performed a craniotomy for abscess drainage. Follow-up as an outpatient  Prostate fluid collection/abscess Seen by urology at [facility].  Per their assessment, fluid collection was secondary to dilated seminal vesicle with no surgical intervention recommended. Infectious disease with continued concern for possible abscess. Patient covered with vancomycin  with recommendation for outpatient urology follow-up.  Type 1 diabetes mellitus A1c 8.9 on 07/10/2023 Currently on Semglee  6 units daily and SSI with Accu-Cheks Blood sugar level under 200 today Recent Labs  Lab 10/08/23 0626 10/08/23 1622 10/08/23 2115 10/09/23 0617 10/09/23 1232  GLUCAP 210* 187* 307* 178* 189*   ESRD on HD Hypertension Hypotension with dialysis Nephrology consulted and managing hemodialysis. Currently blood pressure controlled on metoprolol  tartrate 25 mg twice daily.  Patient also getting midodrine  10 mg on dialysis days MWF Caseworker and renal navigator working on outpatient chair arrangement    Anemia of chronic kidney disease Hemoglobin stable. Recent Labs    10/01/23 1205 10/02/23 0901 10/04/23 0933 10/06/23 0333 10/08/23 0920  HGB 7.4* 7.9* 8.0* 8.8* 7.9*  MCV 93.9 93.6 96.1 93.3 95.1   Acute left upper extremity DVT Continue Eliquis   Right third toe necrosis H/o prior left BKA Per documentation, patient/family opting for conservative management  and declined surgical management. Patient was seen by PT/OT with recommendation for SNF on discharge.   Mild cognitive dysfunction Behavioral changes Likely secondary to brain abscess, craniotomy Currently stable on Seroquel    Pressure injury Left buttock. Not present on admission.   Mobility: Encourage ambulation  Goals of care   Code Status: Full Code    DVT prophylaxis:   apixaban  (ELIQUIS ) tablet 5 mg   Antimicrobials: IV antibiotics till 1/31 Fluid: None Consultants: Nephrology Family Communication: None at bedside.  Called and discussed with his mom on 1/10.  Status: Inpatient Level of care:  Progressive   Patient is from: Home Needs to continue in-hospital care: Pending clinical course.  Needs to complete antibiotic course.  Per caseworker, SNF will be able to take him on Monday 1/13.   Diet:  Diet Order             Diet regular Room service appropriate? Yes; Fluid consistency: Thin; Fluid restriction: 1500 mL Fluid  Diet effective now                   Scheduled Meds:  (feeding supplement) PROSource Plus  30 mL Oral BID BM   apixaban   5 mg Oral BID   bacitracin    Topical Daily   Chlorhexidine  Gluconate Cloth  6 each Topical Q0600   Chlorhexidine  Gluconate Cloth  6 each Topical Q0600   darbepoetin (ARANESP ) injection - DIALYSIS  100 mcg Subcutaneous Q Sun-1800   dextrose   1 Tube Oral Once   dextrose   25 mL Intravenous Once   gabapentin   100 mg Oral TID   Gerhardt's butt cream   Topical BID   influenza vac split trivalent PF  0.5 mL Intramuscular Tomorrow-1000   insulin  aspart  0-6 Units Subcutaneous TID WC   insulin  glargine-yfgn  6 Units Subcutaneous Daily   loratadine   10 mg Oral Daily   metoprolol  tartrate  25 mg Oral BID   midodrine   10 mg Oral Q M,W,F-HD   pantoprazole   40 mg Oral Daily   povidone-Iodine    Topical Q0600   QUEtiapine   12.5 mg Oral q morning   QUEtiapine   25 mg Oral QHS   senna-docusate  2 tablet Oral BID   sevelamer   carbonate  2,400 mg Oral TID WC   sodium zirconium cyclosilicate   10 g Oral TID    PRN meds: acetaminophen , albumin  human, clonazepam , diphenhydrAMINE , diphenhydrAMINE -zinc  acetate, Gerhardt's butt cream, hydrALAZINE , loperamide , metoprolol  tartrate, ondansetron  (ZOFRAN ) IV, oxyCODONE , polyethylene glycol   Infusions:   albumin  human 60 mL/hr at 10/09/23 0356   vancomycin  Stopped (10/08/23 1500)    Antimicrobials: Anti-infectives (From admission, onward)    Start     Dose/Rate Route Frequency Ordered Stop   09/24/23 1200  vancomycin  (VANCOREADY) IVPB 500 mg/100 mL        500 mg 100 mL/hr over 60 Minutes Intravenous Every M-W-F (Hemodialysis) 09/21/23 1246 10/29/23 1159   09/20/23 1200  vancomycin  (VANCOCIN ) 750 mg in sodium chloride  0.9 % 250 mL IVPB  Status:  Discontinued        750 mg 265 mL/hr over 60 Minutes Intravenous Every M-W-F (Hemodialysis) 09/20/23 0446 09/21/23 1246   09/20/23 1045  vancomycin  (VANCOCIN ) 750 mg in sodium chloride  0.9 % 250 mL IVPB        750 mg 265 mL/hr over 60 Minutes Intravenous  Once 09/20/23 0959 09/20/23 1226   09/20/23 0945  vancomycin  (VANCOREADY) IVPB 750 mg/150 mL  Status:  Discontinued        750 mg 150 mL/hr over 60 Minutes Intravenous  Once 09/20/23 0855 09/20/23 0959   09/10/23 1200  vancomycin  (VANCOREADY) IVPB 750 mg/150 mL  Status:  Discontinued        750 mg 150 mL/hr over 60 Minutes Intravenous Every M-W-F (Hemodialysis) 09/09/23 2030 09/20/23 0446   09/09/23 2200  linezolid  (ZYVOX ) tablet 600 mg  Status:  Discontinued        600 mg Oral Every 12 hours 09/09/23 2035 09/10/23 0946       Objective: Vitals:   10/09/23 0853 10/09/23 1324  BP: (!) 125/90 (!) 146/97  Pulse:    Resp: 20 18  Temp: 98 F (36.7 C) 98 F (36.7 C)  SpO2:      Intake/Output Summary (Last 24 hours) at 10/09/2023 1438 Last data filed at 10/09/2023 0356 Gross per 24 hour  Intake 24 ml  Output --  Net 24 ml   Filed Weights   10/08/23 0918 10/08/23  1330 10/09/23 0615  Weight: 96.3 kg 92.4 kg 93.2 kg   Weight change: -1.2 kg Body mass index is 32.18 kg/m.   Physical Exam: General exam: Pleasant, middle-aged male Skin: No rashes, lesions or ulcers. HEENT: Atraumatic, normocephalic, no obvious bleeding.  Right craniotomy site with bandage on Lungs: Clear to auscultation bilaterally,  CVS: S1, S2, no murmur,   GI/Abd: Soft, nontender, nondistended, bowel sound present,   CNS: Alert, awake, oriented to place and person Psychiatry: Mood appropriate,  Extremities: No pedal edema, no calf tenderness.  Prior left BKA status.  Right toe infection noted.  Data Review: I have personally reviewed the laboratory data and studies available.  F/u labs  [employer] (From admission, onward)     Start     Ordered   Signed and Held  Renal function panel  Once,   R       Question:  Specimen collection method  Answer:  Lab=Lab collect   Signed and Held   Signed and Held  Renal function panel  Once,   R       Question:  Specimen collection method  Answer:  Lab=Lab collect   Signed and Held   Signed and Held  CBC  Tomorrow morning,   R       Question:  Specimen collection method  Answer:  Lab=Lab collect   Signed and Held            Total time spent in review of labs and imaging, patient evaluation, formulation of plan, documentation and communication with family: 25 minutes  Signed, Chapman Rota, MD [facility] 10/09/2023

## 2023-10-10 DIAGNOSIS — Z992 Dependence on renal dialysis: Secondary | ICD-10-CM | POA: Diagnosis not present

## 2023-10-10 DIAGNOSIS — I38 Endocarditis, valve unspecified: Secondary | ICD-10-CM | POA: Diagnosis not present

## 2023-10-10 DIAGNOSIS — I120 Hypertensive chronic kidney disease with stage 5 chronic kidney disease or end stage renal disease: Secondary | ICD-10-CM | POA: Diagnosis not present

## 2023-10-10 DIAGNOSIS — N250 Renal osteodystrophy: Secondary | ICD-10-CM | POA: Diagnosis not present

## 2023-10-10 DIAGNOSIS — N186 End stage renal disease: Secondary | ICD-10-CM | POA: Diagnosis not present

## 2023-10-10 DIAGNOSIS — D631 Anemia in chronic kidney disease: Secondary | ICD-10-CM | POA: Diagnosis not present

## 2023-10-10 DIAGNOSIS — I330 Acute and subacute infective endocarditis: Secondary | ICD-10-CM | POA: Diagnosis not present

## 2023-10-10 DIAGNOSIS — R7881 Bacteremia: Secondary | ICD-10-CM | POA: Diagnosis not present

## 2023-10-10 DIAGNOSIS — B9562 Methicillin resistant Staphylococcus aureus infection as the cause of diseases classified elsewhere: Secondary | ICD-10-CM | POA: Diagnosis not present

## 2023-10-10 LAB — GLUCOSE, CAPILLARY
Glucose-Capillary: 98 mg/dL (ref 70–99)
Glucose-Capillary: 236 mg/dL — ABNORMAL HIGH (ref 70–99)

## 2023-10-10 LAB — BASIC METABOLIC PANEL
Anion gap: 17 — ABNORMAL HIGH (ref 5–15)
BUN: 46 mg/dL — ABNORMAL HIGH (ref 6–20)
CO2: 23 mmol/L (ref 22–32)
Calcium: 9.3 mg/dL (ref 8.9–10.3)
Chloride: 96 mmol/L — ABNORMAL LOW (ref 98–111)
Creatinine, Ser: 5.88 mg/dL — ABNORMAL HIGH (ref 0.61–1.24)
GFR, Estimated: 11 mL/min — ABNORMAL LOW (ref >60)
Glucose, Bld: 188 mg/dL — ABNORMAL HIGH (ref 70–99)
Potassium: 6.2 mmol/L — ABNORMAL HIGH (ref 3.5–5.1)
Sodium: 136 mmol/L (ref 135–145)

## 2023-10-10 MED ORDER — CHLORHEXIDINE GLUCONATE CLOTH 2 % EX PADS
6.0000 | MEDICATED_PAD | Freq: Every day | CUTANEOUS | Status: DC
  Administered 2023-10-11 – 2023-11-01 (×20): 6 via TOPICAL

## 2023-10-10 NOTE — Progress Notes (Signed)
 [facility] Progress Note   Subjective:   Patient seen and examined at bedside.  Discussed CT findings of pleural effusions and diffuse anasarca and reminded of importance of fluid restrictions and dialysis compliance.  Agreed to HD tomorrow morning prior to discharge and starting at [facility] on Tuesday.  Denies CP, SOB, abdominal pain and n/v/d.    Objective Vitals:   10/10/23 0546 10/10/23 0552 10/10/23 0555 10/10/23 0857  BP:   (!) 134/90 131/78  Pulse:   (!) 110 (!) 112  Resp:   18 18  Temp:  98.2 F (36.8 C)  97.9 F (36.6 C)  TempSrc:  Oral  Oral  SpO2:   100% 97%  Weight: 92.9 kg     Height:       Physical Exam General:chronically ill appearing male in NAD Heart:+tachycardia, 2/6 murmur Lungs:mostly CTAB, breath sounds decreased Abdomen:soft, +tight abdominal edema Extremities:no LE edema Dialysis Access: L fem [facility]   Filed Weights   10/08/23 1330 10/09/23 0615 10/10/23 0546  Weight: 92.4 kg 93.2 kg 92.9 kg   No intake or output data in the 24 hours ending 10/10/23 1416  Additional Objective Labs: Basic Metabolic Panel: Recent Labs  Lab 10/04/23 0235 10/06/23 0333 10/08/23 0920 10/10/23 1106  NA 137 137 136 136  K 5.4* 5.6* 5.4* 6.2*  CL 97* 94* 95* 96*  CO2 24 26 26 23   GLUCOSE 319* 291* 189* 188*  BUN 39* 49* 45* 46*  CREATININE 6.45* 6.27* 6.02* 5.88*  CALCIUM  9.1 9.4 9.0 9.3  PHOS 9.4*  --  9.2*  --    Liver Function Tests: Recent Labs  Lab 10/04/23 0235 10/08/23 0920  ALBUMIN  2.7* 3.1*    CBC: Recent Labs  Lab 10/04/23 0933 10/06/23 0333 10/08/23 0920  WBC 6.1 6.0 6.2  HGB 8.0* 8.8* 7.9*  HCT 27.3* 29.4* 27.0*  MCV 96.1 93.3 95.1  PLT 205 217 236   Blood Culture    Component Value Date/Time   SDES BLOOD SITE NOT SPECIFIED 08/25/2023 1706   SPECREQUEST  08/25/2023 1706    BOTTLES DRAWN AEROBIC ONLY Blood Culture results may not be optimal due to an inadequate volume of blood received in culture bottles   CULT   08/25/2023 1706    NO GROWTH 5 DAYS Performed at [facility], [address] [place], [name] [address]    REPTSTATUS 08/30/2023 FINAL 08/25/2023 1706    Studies/Results: CT CHEST W CONTRAST Result Date: 10/09/2023 CLINICAL DATA:  Assess vascular anatomy for possible recannulization prior to hemodialysis catheter placement. EXAM: CT CHEST WITH CONTRAST TECHNIQUE: Multidetector CT imaging of the chest was performed during intravenous contrast administration. RADIATION DOSE REDUCTION: This exam was performed according to the departmental dose-optimization program which includes automated exposure control, adjustment of the mA and/or kV according to patient size and/or use of iterative reconstruction technique. CONTRAST:  50mL OMNIPAQUE  IOHEXOL  350 MG/ML SOLN COMPARISON:  Chest x-ray dated September 16, 2023. CT chest dated May 30, 2020. FINDINGS: Cardiovascular: Mild cardiomegaly. No pericardial effusion. Three-vessel coronary artery atherosclerotic calcification. No thoracic aortic aneurysm. No central pulmonary embolism. Reflux of contrast into the IVC and hepatic veins. Poor opacification of the venous system. The SVC was occluded on recent hemodialysis catheter placement in October. Right brachiocephalic and bilateral axillary vein stents again noted. Mediastinum/Nodes: Mildly enlarged bilateral axillary lymph nodes, likely reactive. No enlarged mediastinal or hilar lymph nodes. Thyroid gland, trachea, and esophagus demonstrate no significant findings. Lungs/Pleura: Moderate to large right and small  left pleural effusions. Bilateral lower lobe and lingular atelectasis. No consolidation or pneumothorax. Upper Abdomen: No acute abnormality. Musculoskeletal: Diffuse anasarca. Extensive venous collaterals in the chest wall bilaterally. IMPRESSION: 1. Poor opacification of the venous system. The SVC was occluded on recent hemodialysis catheter placement in October. Right brachiocephalic and  bilateral axillary vein stents again noted. 2. Moderate to large right and small left pleural effusions with bilateral lower lobe and lingular atelectasis. 3. Diffuse anasarca. 4.  Aortic Atherosclerosis (ICD10-I70.0). Electronically Signed   By: Elsie ONEIDA Shoulder M.D.   On: 10/09/2023 17:55    Medications:  albumin  human 60 mL/hr at 10/09/23 0356   vancomycin  Stopped (10/08/23 1500)    (feeding supplement) PROSource Plus  30 mL Oral BID BM   apixaban   5 mg Oral BID   bacitracin    Topical Daily   Chlorhexidine  Gluconate Cloth  6 each Topical Q0600   Chlorhexidine  Gluconate Cloth  6 each Topical Q0600   darbepoetin (ARANESP ) injection - DIALYSIS  100 mcg Subcutaneous Q Sun-1800   dextrose   1 Tube Oral Once   dextrose   25 mL Intravenous Once   gabapentin   100 mg Oral TID   Gerhardt's butt cream   Topical BID   influenza vac split trivalent PF  0.5 mL Intramuscular Tomorrow-1000   insulin  aspart  0-6 Units Subcutaneous TID WC   insulin  glargine-yfgn  6 Units Subcutaneous Daily   loratadine   10 mg Oral Daily   metoprolol  tartrate  25 mg Oral BID   midodrine   10 mg Oral Q M,W,F-HD   pantoprazole   40 mg Oral Daily   povidone-Iodine    Topical Q0600   QUEtiapine   12.5 mg Oral q morning   QUEtiapine   25 mg Oral QHS   senna-docusate  2 tablet Oral BID   sevelamer  carbonate  2,400 mg Oral TID WC   sodium zirconium cyclosilicate   10 g Oral TID    Dialysis Orders: MWF - [facility] 4hr, 400/A1.5, EDW 77kg, L femoral TDC, 2K/3Ca bath, no heparin    Assessment/Plan: MRSA bacteremia with MV/TV endocarditis + brain abscess:  Not a candidate for valve surgery here, S/p line holiday and brain abscess drainage (at [facility]). Continues on extended Vanc course - will be on Vanc 750mcg IV q HD until 10/27/23. MRSA brain abscess: S/p transfer to [facility] and drainage 08/31/24 Acute B CVA/septic emboli Acute LUE DVT: On Eliquis  ESRD: Usual MWF schedule, Transitioning to new TTS schedule this  week.  With recurrent hyperkalemia and volume issues can not wait until Tuesday for outpatient HD.  K 6.2 today, has not had lokelma  in last 24 hours.  Orders written to HD today, then can start new schedule on Tuesday.  Dialysis access: Has L femoral TDC, Hx B subclavian occlusions. HTN/volume: BP stable. Lots of LUE edema + flank edema, CT with diffuse anasarca. Well above prior dry weight. Max UF as tolerated with HD.   Anemia of ESRD: Hgb 7.9- Increase Aranesp  150mcg q Sunday while here. No Iv iron  while on abx. Secondary HPTH: CorrCa high, Phos high. Hold calcitriol , continue sevelamer  as binder but ^ dose to 3/meals. Hyperkalemia: Despite Lokelma  10g TID (refusing at times), utilizing 1K bath with HD if needed. K 6.2 today.  Plan for HD today. Nutrition: Alb low, adding protein supplements. T1D: Per primary. Dispo: SNF being arranged. Since has been hospitalized for so long, his outpatient clinic temporarily fillled his prior chair - [facility] until [facility] get  back to [facility].   Manuelita Labella, PA-C [facility] 10/10/2023,2:16 PM  LOS: 31 days

## 2023-10-10 NOTE — Plan of Care (Signed)
   Problem: Education: Goal: Knowledge of General Education information will improve Description: Including pain rating scale, medication(s)/side effects and non-pharmacologic comfort measures Outcome: Progressing   Problem: Skin Integrity: Goal: Risk for impaired skin integrity will decrease Outcome: Progressing

## 2023-10-10 NOTE — Progress Notes (Signed)
 PROGRESS NOTE  Ubaldo DELENA Sar  DOB: May 10, 1981  PCP: Celestia Rosaline SQUIBB, NP FMW:[id number]  DOA: 09/09/2023  LOS: 31 days  Hospital Day: 32  Brief narrative: CHARLE CLEAR is a 43 y.o. male with PMH significant for ESRD HD, DM 1, gastroparesis, neuropathy, diabetic foot s/p prior left AKA, recent MRSA endocarditis, stroke 10/11, patient was hospitalized for DKA.  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   11/29, patient was transferred to [facility] for MV repair.  While at [facility] he underwent cardiac cath showed non obstructive disease, craniotomy with abscess drainage on 09/01/23.  Course over there was complicated by subarachnoid hemorrhage while on IV heparin  as well as right third toe distal necrosis.  He was seen by podiatry, but family wanted conservative management. Since his tertiary care needs have been completed, he was transferred back to [facility] for further management on 12/12. PTOT  recommending SNF and awaiting placement.   Subjective: Patient was seen and examined this morning.  Lying in bed.  Not in distress.  No new symptoms.   Unclear why tachycardic this morning to 110s, blood pressure in normal range. pending placement.  Tentatively planned for Monday morning.  Assessment and plan: MRSA TV/MV endocarditis S/p MV repair at [facility] -11/29 10/12, blood cultures positive for MRSA. 10/13, TTE showed LVEF of 25-30% with concern for AV/MV abnormality  10/16 and 10/22, TEE showed MV/TV vegetations.  11/29, s/p MV repair at [facility]  Currently on IV vancomycin .  Per ID recommendation, to complete 8 weeks of treatment EOT 1/29. Recommendation for outpatient ID follow-up with appointment scheduled for 10/12/2023. May need that rescheduled if patient is still remains high in the hospital. While at [facility], patient  underwent a left heart catheterization which was significant for nonobstructive disease.  Acute CVA Secondary to septic emboli.   10/17, MRI showed large right MCA branch infarct.  11/25, MRA head showed significant for large necrotic appearing lesion within the right frontal lobe within area of the prior right MCA territory infarct with vasogenic edema and 8 mm leftward midline shift.  Stroke workup completed.   Neurology recommendations for Eliquis .  PT/OT recommendations for SNF.   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  12/4, while at [facility], patient was evaluated by neurosurgery who performed a craniotomy for abscess drainage. Follow-up as an outpatient  Prostate fluid collection/abscess Seen by urology at [name].  Per their assessment, fluid collection was secondary to dilated seminal vesicle with no surgical intervention recommended. Infectious disease with continued concern for possible abscess. Patient covered with vancomycin  with recommendation for outpatient urology follow-up.  Type 1 diabetes mellitus A1c 8.9 on 07/10/2023 Currently on Semglee  6 units daily and SSI with Accu-Cheks Blood sugar level under 200 today Recent Labs  Lab 10/09/23 0617 10/09/23 1232 10/09/23 1716 10/09/23 2106 10/10/23 0601  GLUCAP 178* 189* 203* 220* 98   ESRD on HD Hypertension Hypotension with dialysis Nephrology consulted and managing hemodialysis. Currently blood pressure controlled on metoprolol  tartrate 25 mg twice daily.  Patient also getting midodrine  10 mg on dialysis days MWF. Blood pressure is in normal range but tachycardic to 110s this morning.  Continue to monitor. Caseworker and renal navigator working on outpatient chair arrangement    Anemia of chronic kidney disease Hemoglobin stable. Recent Labs    10/01/23 1205 10/02/23 0901 10/04/23  9066 10/06/23 0333 10/08/23 0920  HGB 7.4* 7.9* 8.0* 8.8* 7.9*  MCV 93.9 93.6 96.1 93.3  95.1   Acute left upper extremity DVT Continue Eliquis    Right third toe necrosis H/o prior left BKA Per documentation, patient/family opting for conservative management and declined surgical management. Patient was seen by PT/OT with recommendation for SNF on discharge.   Mild cognitive dysfunction Behavioral changes Likely secondary to brain abscess, craniotomy Currently stable on Seroquel    Pressure injury Left buttock. Not present on admission.   Mobility: Encourage ambulation  Goals of care   Code Status: Full Code    DVT prophylaxis:   apixaban  (ELIQUIS ) tablet 5 mg   Antimicrobials: IV antibiotics till 1/31 Fluid: None Consultants: Nephrology Family Communication: None at bedside.  Called and discussed with his mom on 1/10.  Status: Inpatient Level of care:  Progressive   Patient is from: Home Needs to continue in-hospital care: Pending clinical course.  Needs to complete antibiotic course.  Per caseworker, SNF will be able to take him on Monday 1/13.   Diet:  Diet Order             Diet regular Room service appropriate? Yes; Fluid consistency: Thin; Fluid restriction: 1500 mL Fluid  Diet effective now                   Scheduled Meds:  (feeding supplement) PROSource Plus  30 mL Oral BID BM   apixaban   5 mg Oral BID   bacitracin    Topical Daily   Chlorhexidine  Gluconate Cloth  6 each Topical Q0600   Chlorhexidine  Gluconate Cloth  6 each Topical Q0600   darbepoetin (ARANESP ) injection - DIALYSIS  100 mcg Subcutaneous Q Sun-1800   dextrose   1 Tube Oral Once   dextrose   25 mL Intravenous Once   gabapentin   100 mg Oral TID   Gerhardt's butt cream   Topical BID   influenza vac split trivalent PF  0.5 mL Intramuscular Tomorrow-1000   insulin  aspart  0-6 Units Subcutaneous TID WC   insulin  glargine-yfgn  6 Units Subcutaneous Daily   loratadine   10 mg Oral Daily   metoprolol  tartrate  25 mg Oral BID   midodrine   10 mg Oral Q M,W,F-HD    pantoprazole   40 mg Oral Daily   povidone-Iodine    Topical Q0600   QUEtiapine   12.5 mg Oral q morning   QUEtiapine   25 mg Oral QHS   senna-docusate  2 tablet Oral BID   sevelamer  carbonate  2,400 mg Oral TID WC   sodium zirconium cyclosilicate   10 g Oral TID    PRN meds: acetaminophen , albumin  human, clonazepam , diphenhydrAMINE , diphenhydrAMINE -zinc  acetate, Gerhardt's butt cream, hydrALAZINE , loperamide , metoprolol  tartrate, ondansetron  (ZOFRAN ) IV, oxyCODONE , polyethylene glycol   Infusions:   albumin  human 60 mL/hr at 10/09/23 0356   vancomycin  Stopped (10/08/23 1500)    Antimicrobials: Anti-infectives (From admission, onward)    Start     Dose/Rate Route Frequency Ordered Stop   09/24/23 1200  vancomycin  (VANCOREADY) IVPB 500 mg/100 mL        500 mg 100 mL/hr over 60 Minutes Intravenous Every M-W-F (Hemodialysis) 09/21/23 1246 10/29/23 1159   09/20/23 1200  vancomycin  (VANCOCIN ) 750 mg in sodium chloride  0.9 % 250 mL IVPB  Status:  Discontinued        750 mg 265 mL/hr over 60 Minutes Intravenous Every M-W-F (Hemodialysis) 09/20/23 0446 09/21/23 1246   09/20/23 1045  vancomycin  (VANCOCIN ) 750 mg in sodium chloride  0.9 %  250 mL IVPB        750 mg 265 mL/hr over 60 Minutes Intravenous  Once 09/20/23 0959 09/20/23 1226   09/20/23 0945  vancomycin  (VANCOREADY) IVPB 750 mg/150 mL  Status:  Discontinued        750 mg 150 mL/hr over 60 Minutes Intravenous  Once 09/20/23 0855 09/20/23 0959   09/10/23 1200  vancomycin  (VANCOREADY) IVPB 750 mg/150 mL  Status:  Discontinued        750 mg 150 mL/hr over 60 Minutes Intravenous Every M-W-F (Hemodialysis) 09/09/23 2030 09/20/23 0446   09/09/23 2200  linezolid  (ZYVOX ) tablet 600 mg  Status:  Discontinued        600 mg Oral Every 12 hours 09/09/23 2035 09/10/23 0946       Objective: Vitals:   10/10/23 0555 10/10/23 0857  BP: (!) 134/90 131/78  Pulse: (!) 110 (!) 112  Resp: 18 18  Temp:  97.9 F (36.6 C)  SpO2: 100% 97%   No  intake or output data in the 24 hours ending 10/10/23 1047  Filed Weights   10/08/23 1330 10/09/23 0615 10/10/23 0546  Weight: 92.4 kg 93.2 kg 92.9 kg   Weight change: -3.4 kg Body mass index is 32.08 kg/m.   Physical Exam: General exam: Pleasant, middle-aged male Skin: No rashes, lesions or ulcers. HEENT: Atraumatic, normocephalic, no obvious bleeding.  Right craniotomy site with bandage on Lungs: Clear to auscultation bilaterally,  CVS: S1, S2, no murmur,   GI/Abd: Soft, nontender, nondistended, bowel sound present,   CNS: Alert, awake, oriented to place and person Psychiatry: Mood appropriate,  Extremities: No pedal edema, no calf tenderness.  Prior left BKA status.  Right toe infection noted.  Data Review: I have personally reviewed the laboratory data and studies available.  F/u labs  Unresulted Labs (From admission, onward)     Start     Ordered   10/10/23 0956  Basic metabolic panel  Once,   R       Question:  Specimen collection method  Answer:  Lab=Lab collect   10/10/23 0955   Signed and Held  Renal function panel  Once,   R       Question:  Specimen collection method  Answer:  Lab=Lab collect   Signed and Held   Signed and Held  Renal function panel  Once,   R       Question:  Specimen collection method  Answer:  Lab=Lab collect   Signed and Held   Signed and Held  CBC  Tomorrow morning,   R       Question:  Specimen collection method  Answer:  Lab=Lab collect   Signed and Held            Total time spent in review of labs and imaging, patient evaluation, formulation of plan, documentation and communication with family: 25 minutes  Signed, Chapman Rota, MD [facility] 10/10/2023

## 2023-10-10 NOTE — Progress Notes (Signed)
 Pt refuses CBG and will not attend eve dialysis- "DON'T BOTHER CALLING, I'M NOT GOING TONIGHT- THEY CAN DO ME FIRST THING IN THE MORNING. I'm not going.I do M/W/F anyway." Pt loud and firm in this.  Otherwise, cooperative and well mannered.

## 2023-10-10 NOTE — Progress Notes (Signed)
 Pharmacy Antibiotic Note  [name] is a 43 y.o. 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 Pt was transferred to [facility] for MV repair 11/29 and craniotomy with abscess drainage on 12/4. Pharmacy has been consulted for continued Vancomycin  dosing.  Patient remains on HD, getting supplemental doses of vanc after each session. May need to address outpatient HD schedule for vanc orders at discharge.  Plan: Continue Vancomycin  500 mg IV post HD MWF through 10/27/23 Will monitor HD tolerance and dosing    Height: 5' 7 (170.2 cm) Weight: 92.9 kg (204 lb 12.9 oz) IBW/kg (Calculated) : 66.1  Temp (24hrs), Avg:98 F (36.7 C), Min:97.9 F (36.6 C), Max:98.2 F (36.8 C)  Recent Labs  Lab 10/04/23 0235 10/04/23 0933 10/06/23 0333 10/08/23 0920  WBC  --  6.1 6.0 6.2  CREATININE 6.45*  --  6.27* 6.02*    Estimated Creatinine Clearance: 17 mL/min (A) (by C-G formula based on SCr of 6.02 mg/dL (H)).    No Known Allergies  Antimicrobials this admission: Vancomycin  10/12 >>(10/27/23) Cefepime  10/21 >> 10/23; restart 10/25 >> 10/29 Linezolid  11/27 >> 12/10   Vancomycin  levels  -10/17 preHD>>25 -10/24: VR= 16 -11/1: VR= 24 -11/8 VR 22 -11/15 VR 22  -11/22 VR 28 - held dose x1, decrease to 500mg  -11/28 VR 18 - cont 500mg  MWF 11/29 - Transferred to [facility] -12/11 VR 19.8 (at [facility]) - mostly 750mg  with HD (occasionally 500mg  instead) - 12/16 VR 25 mcg/mL- continue 750 mg iv MWF - 12/24 VR 27 mcg/mL- hold 1 dose / reduce to 500 mg iv MWF thereafter - 10/01/23 VR 15 - therapeutic    Microbiology results: 10/12 MRSA PCR >> positive 10/12 CDiff antigen +, toxin -, PCR - >> negative 10/12 BCx >> 4/4 MRSA 10/13 BCx >> Negative 10/17 BCx >> Negative 10/21 RCx >> Negative 11/26 Bcx >> negative 11/27 Bcx >> Negative   Nidia Dewey Schaffer, PharmD PGY1 Pharmacy Resident  Please check AMION for all [facility] Pharmacy phone numbers After  10:00 PM, call Main Pharmacy [phone] 10/10/2023, 9:51 AM

## 2023-10-10 NOTE — Progress Notes (Signed)
Patient refusing CBG at this time.

## 2023-10-10 NOTE — Progress Notes (Signed)
 MEWS Progress Note  Patient Details Name: [name] MRN: [id number] DOB: 1981-07-10 Today's Date: 10/10/2023   MEWS Flowsheet Documentation:  Assess: MEWS Score Temp: 97.9 F (36.6 C) BP: 131/78 MAP (mmHg): 96 Pulse Rate: (!) 112 ECG Heart Rate: 96 Resp: 18 Level of Consciousness: Alert SpO2: 97 % O2 Device: Room Air Patient Activity (if Appropriate): In bed O2 Flow Rate (L/min): 2 L/min Assess: MEWS Score MEWS Temp: 0 MEWS Systolic: 0 MEWS Pulse: 2 MEWS RR: 0 MEWS LOC: 0 MEWS Score: 2 MEWS Score Color: Yellow Assess: SIRS CRITERIA SIRS Temperature : 0 SIRS Respirations : 0 SIRS Pulse: 1 SIRS WBC: 0 SIRS Score Sum : 1 SIRS Temperature : 0 SIRS Pulse: 1 SIRS Respirations : 0 SIRS WBC: 0 SIRS Score Sum : 1 Assess: if the MEWS score is Yellow or Red Were vital signs accurate and taken at a resting state?: Yes Does the patient meet 2 or more of the SIRS criteria?: No Does the patient have a confirmed or suspected source of infection?: No MEWS guidelines implemented : Yes, yellow Treat MEWS Interventions: Considered administering scheduled or prn medications/treatments as ordered Take Vital Signs Increase Vital Sign Frequency : Yellow: Q2hr x1, continue Q4hrs until patient remains green for 12hrs Escalate MEWS: Escalate: Yellow: Discuss with charge nurse and consider notifying provider and/or RRT        [name] [name] 10/10/2023, 9:03 AM

## 2023-10-11 DIAGNOSIS — I330 Acute and subacute infective endocarditis: Secondary | ICD-10-CM | POA: Diagnosis not present

## 2023-10-11 DIAGNOSIS — R7881 Bacteremia: Secondary | ICD-10-CM | POA: Diagnosis not present

## 2023-10-11 DIAGNOSIS — I120 Hypertensive chronic kidney disease with stage 5 chronic kidney disease or end stage renal disease: Secondary | ICD-10-CM | POA: Diagnosis not present

## 2023-10-11 DIAGNOSIS — Z992 Dependence on renal dialysis: Secondary | ICD-10-CM | POA: Diagnosis not present

## 2023-10-11 DIAGNOSIS — B9562 Methicillin resistant Staphylococcus aureus infection as the cause of diseases classified elsewhere: Secondary | ICD-10-CM | POA: Diagnosis not present

## 2023-10-11 DIAGNOSIS — N250 Renal osteodystrophy: Secondary | ICD-10-CM | POA: Diagnosis not present

## 2023-10-11 DIAGNOSIS — I38 Endocarditis, valve unspecified: Secondary | ICD-10-CM | POA: Diagnosis not present

## 2023-10-11 DIAGNOSIS — N186 End stage renal disease: Secondary | ICD-10-CM | POA: Diagnosis not present

## 2023-10-11 DIAGNOSIS — D631 Anemia in chronic kidney disease: Secondary | ICD-10-CM | POA: Diagnosis not present

## 2023-10-11 LAB — GLUCOSE, CAPILLARY
Glucose-Capillary: 169 mg/dL — ABNORMAL HIGH (ref 70–99)
Glucose-Capillary: 142 mg/dL — ABNORMAL HIGH (ref 70–99)
Glucose-Capillary: 223 mg/dL — ABNORMAL HIGH (ref 70–99)
Glucose-Capillary: 273 mg/dL — ABNORMAL HIGH (ref 70–99)

## 2023-10-11 MED ORDER — ORAL CARE MOUTH RINSE: 15.0000 mL | OROMUCOSAL | Status: DC | PRN

## 2023-10-11 MED ORDER — ANTICOAGULANT SODIUM CITRATE 4% (200MG/5ML) IV SOLN: 5.0000 mL | Status: DC | PRN

## 2023-10-11 MED ORDER — HEPARIN SODIUM (PORCINE) 1000 UNIT/ML DIALYSIS
1000.0000 [IU] | INTRAMUSCULAR | Status: DC | PRN
Start: 2023-10-11 — End: 2023-10-11

## 2023-10-11 MED ORDER — PENTAFLUOROPROP-TETRAFLUOROETH EX AERO: 1.0000 | INHALATION_SPRAY | CUTANEOUS | Status: DC | PRN

## 2023-10-11 MED ORDER — VANCOMYCIN HCL 500 MG/100ML IV SOLN
500.0000 mg | INTRAVENOUS | Status: DC
  Filled 2023-10-11: qty 100

## 2023-10-11 MED ORDER — LIDOCAINE-PRILOCAINE 2.5-2.5 % EX CREA: 1.0000 | TOPICAL_CREAM | CUTANEOUS | Status: DC | PRN

## 2023-10-11 MED ORDER — MIDODRINE HCL 5 MG PO TABS
10.0000 mg | ORAL_TABLET | Freq: Once | ORAL | Status: AC
  Administered 2023-10-11: 10 mg via ORAL

## 2023-10-11 MED ORDER — VANCOMYCIN HCL 500 MG/100ML IV SOLN: 500.0000 mg | INTRAVENOUS | Status: DC

## 2023-10-11 MED ORDER — HYDROMORPHONE HCL 1 MG/ML IJ SOLN
0.5000 mg | INTRAMUSCULAR | Status: AC
  Administered 2023-10-11: 0.5 mg via INTRAVENOUS
  Filled 2023-10-11: qty 0.5

## 2023-10-11 MED ORDER — HEPARIN SODIUM (PORCINE) 1000 UNIT/ML IJ SOLN
INTRAMUSCULAR | Status: AC
  Administered 2023-10-11: 3600 [IU]
  Filled 2023-10-11: qty 2

## 2023-10-11 MED ORDER — HEPARIN SODIUM (PORCINE) 1000 UNIT/ML IJ SOLN
INTRAMUSCULAR | Status: AC
  Filled 2023-10-11: qty 1

## 2023-10-11 MED ORDER — LIDOCAINE HCL (PF) 1 % IJ SOLN: 5.0000 mL | INTRAMUSCULAR | Status: DC | PRN

## 2023-10-11 MED ORDER — ALTEPLASE 2 MG IJ SOLR: 2.0000 mg | Freq: Once | INTRAMUSCULAR | Status: DC | PRN

## 2023-10-11 MED ORDER — HYDROMORPHONE HCL 1 MG/ML IJ SOLN
0.5000 mg | INTRAMUSCULAR | Status: DC | PRN
  Administered 2023-10-11 – 2023-10-26 (×47): 0.5 mg via INTRAVENOUS
  Filled 2023-10-11: qty 0.5
  Filled 2023-10-11 (×6): qty 1
  Filled 2023-10-11: qty 0.5
  Filled 2023-10-11 (×2): qty 1
  Filled 2023-10-11: qty 0.5
  Filled 2023-10-11 (×11): qty 1
  Filled 2023-10-11: qty 0.5
  Filled 2023-10-11 (×4): qty 1
  Filled 2023-10-11: qty 0.5
  Filled 2023-10-11 (×7): qty 1
  Filled 2023-10-11: qty 0.5
  Filled 2023-10-11 (×14): qty 1

## 2023-10-11 NOTE — TOC Progression Note (Signed)
 Transition of Care [facility]) - Progression Note    Patient Details  Name: [name] MRN: [id number] Date of Birth: July 27, 1981  [facility]) CM/SW Contact  [name], [place] Phone Number: 10/11/2023, 11:07 AM  Clinical Narrative:   Per MD, pt is not medically ready at this time for DC.   Per [name], there is no male bed/private room available at this time. [name] will keep CSW updated. Insurance auth approval dates 1/8 - 1/14.     Expected Discharge Plan: Skilled Nursing Facility Barriers to Discharge: Continued Medical Work up  Expected Discharge Plan and Services                                               Social Determinants of Health (SDOH) Interventions SDOH Screenings   Food Insecurity: No Food Insecurity (09/09/2023)  Housing: Unknown (09/09/2023)  Transportation Needs: No Transportation Needs (09/09/2023)  Utilities: Not At Risk (09/09/2023)  Alcohol Screen: Low Risk  (02/15/2023)  Depression (PHQ2-9): Low Risk  (02/15/2023)  Financial Resource Strain: Low Risk  (02/15/2023)  Physical Activity: Inactive (02/15/2023)  Social Connections: Socially Integrated (02/15/2023)  Stress: No Stress Concern Present (02/15/2023)  Tobacco Use: Low Risk  (09/12/2023)  Recent Concern: Tobacco Use - Medium Risk (09/01/2023)   Received from [facility]    Readmission Risk Interventions     No data to display

## 2023-10-11 NOTE — Procedures (Signed)
 I was present at this dialysis session. I have reviewed the session itself and made appropriate changes.   Started on 1K but will move to 2K.  Goal UF is 3L.  BPs soft got 10mg  midodrine  pre HD, plan for mid treatment dose as well.  Tentative for SNF today and to [facility].  OUtpt treatment 1/14.    Filed Weights   10/10/23 0546 10/11/23 0610 10/11/23 0820  Weight: 92.9 kg 96.2 kg 96.2 kg    Recent Labs  Lab 10/08/23 0920 10/10/23 1106  NA 136 136  K 5.4* 6.2*  CL 95* 96*  CO2 26 23  GLUCOSE 189* 188*  BUN 45* 46*  CREATININE 6.02* 5.88*  CALCIUM  9.0 9.3  PHOS 9.2*  --     Recent Labs  Lab 10/06/23 0333 10/08/23 0920  WBC 6.0 6.2  HGB 8.8* 7.9*  HCT 29.4* 27.0*  MCV 93.3 95.1  PLT 217 236    Scheduled Meds:  (feeding supplement) PROSource Plus  30 mL Oral BID BM   apixaban   5 mg Oral BID   bacitracin    Topical Daily   Chlorhexidine  Gluconate Cloth  6 each Topical Q0600   darbepoetin (ARANESP ) injection - DIALYSIS  100 mcg Subcutaneous Q Sun-1800   dextrose   1 Tube Oral Once   dextrose   25 mL Intravenous Once   gabapentin   100 mg Oral TID   Gerhardt's butt cream   Topical BID   influenza vac split trivalent PF  0.5 mL Intramuscular Tomorrow-1000   insulin  aspart  0-6 Units Subcutaneous TID WC   insulin  glargine-yfgn  6 Units Subcutaneous Daily   loratadine   10 mg Oral Daily   metoprolol  tartrate  25 mg Oral BID   midodrine   10 mg Oral Q M,W,F-HD   pantoprazole   40 mg Oral Daily   povidone-Iodine    Topical Q0600   QUEtiapine   12.5 mg Oral q morning   QUEtiapine   25 mg Oral QHS   senna-docusate  2 tablet Oral BID   sevelamer  carbonate  2,400 mg Oral TID WC   sodium zirconium cyclosilicate   10 g Oral TID   Continuous Infusions:  albumin  human 60 mL/hr at 10/09/23 0356   vancomycin  Stopped (10/08/23 1500)   PRN Meds:.acetaminophen , albumin  human, clonazepam , diphenhydrAMINE , diphenhydrAMINE -zinc  acetate, Gerhardt's butt cream, hydrALAZINE , HYDROmorphone   (DILAUDID ) injection, loperamide , metoprolol  tartrate, ondansetron  (ZOFRAN ) IV, oxyCODONE , polyethylene glycol   Bernardino Gasman  MD 10/11/2023, 9:54 AM

## 2023-10-11 NOTE — Plan of Care (Signed)
   Problem: Metabolic: Goal: Ability to maintain appropriate glucose levels will improve Outcome: Progressing   Problem: Skin Integrity: Goal: Risk for impaired skin integrity will decrease Outcome: Progressing

## 2023-10-11 NOTE — Progress Notes (Signed)
 Case discussed with CSW. Pt not for d/c today. Contacted [facility] to make staff aware pt will not start tomorrow. Will assist as needed.   Olivia Canter Renal Navigator [phone]

## 2023-10-11 NOTE — Progress Notes (Signed)
 PROGRESS NOTE  Ubaldo DELENA Sar  DOB: 08-06-1981  PCP: Celestia Rosaline SQUIBB, NP FMW:[id number]  DOA: 09/09/2023  LOS: 32 days  Hospital Day: 33  Brief narrative: [name] is a 43 y.o. male with PMH significant for ESRD HD, DM 1, gastroparesis, neuropathy, diabetic foot s/p prior left AKA, recent MRSA endocarditis, stroke 10/11, patient was hospitalized for DKA.  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   11/29, patient was transferred to [facility] for MV repair.  While at [facility] he underwent cardiac cath showed non obstructive disease, craniotomy with abscess drainage on 09/01/23.  Course over there was complicated by subarachnoid hemorrhage while on IV heparin  as well as right third toe distal necrosis.  He was seen by podiatry, but family wanted conservative management. Since his tertiary care needs have been completed, he was transferred back to [facility] for further management on 12/12. PTOT  recommending SNF and awaiting placement.   Subjective: Patient was seen and examined this morning at HD. Somnolent.  Blood pressure downtrending today. Getting midodrine  and IV albumin  with dialysis  Assessment and plan: Hypotension  ESRD on HD Patient is significantly hypotensive at dialysis today.  Getting IV albumin  and midodrine .   Nephrology following. Currently on metoprolol  25 mg twice daily as well.  I would hold it at this time. Continue midodrine  10 mg MWF Post discharge, tentative plan of dialysis TTS Caseworker and renal navigator working on outpatient chair arrangement  MRSA TV/MV endocarditis S/p MV repair at [facility] -11/29 10/12, blood cultures positive for MRSA. 10/13, TTE showed LVEF of 25-30% with concern for AV/MV abnormality  10/16 and 10/22, TEE showed MV/TV vegetations.  11/29, s/p MV repair at [facility]   Currently on IV vancomycin .  Per ID recommendation, to complete 8 weeks of treatment EOT 1/29. Recommendation for outpatient ID follow-up with appointment scheduled for 10/12/2023. May need that rescheduled if patient is still remains high in the hospital. While at [facility], patient underwent a left heart catheterization which was significant for nonobstructive disease.  Acute CVA Secondary to septic emboli.   10/17, MRI showed large right MCA branch infarct.  11/25, MRA head showed significant for large necrotic appearing lesion within the right frontal lobe within area of the prior right MCA territory infarct with vasogenic edema and 8 mm leftward midline shift.  Stroke workup completed.   Neurology recommendations for Eliquis .  PT/OT recommendations for SNF.   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  12/4, while at [facility], patient was evaluated by neurosurgery who performed a craniotomy for abscess drainage. Follow-up as an outpatient  Prostate fluid collection/abscess Seen by urology at [facility].  Per their assessment, fluid collection was secondary to dilated seminal vesicle with no surgical intervention recommended. Infectious disease with continued concern for possible abscess. Patient covered with vancomycin  with recommendation for outpatient urology follow-up.  Type 1 diabetes mellitus A1c 8.9 on 07/10/2023 Currently on Semglee  6 units daily and SSI with Accu-Cheks Blood sugar level mostly under 200 Recent Labs  Lab 10/09/23 1716 10/09/23 2106 10/10/23 0601 10/10/23 2044 10/11/23 0609  GLUCAP 203* 220* 98 236* 169*     Anemia of chronic kidney disease Hemoglobin stable. Recent Labs    10/01/23 1205 10/02/23 0901 10/04/23 0933 10/06/23 0333 10/08/23 0920  HGB 7.4* 7.9* 8.0* 8.8* 7.9*  MCV 93.9 93.6 96.1 93.3  95.1   Acute left upper extremity DVT Continue Eliquis    Right third toe necrosis H/o prior left BKA Per documentation,  patient/family opted for conservative management and declined surgical management. Patient was seen by PT/OT with recommendation for SNF on discharge.   Mild cognitive dysfunction Behavioral changes Likely secondary to brain abscess, craniotomy Currently stable on Seroquel    Pressure injury Left buttock. Not present on admission.   Mobility: Encourage ambulation  Goals of care   Code Status: Full Code    DVT prophylaxis:   apixaban  (ELIQUIS ) tablet 5 mg   Antimicrobials: IV antibiotics till 1/31 Fluid: None Consultants: Nephrology Family Communication: None at bedside.  Called and discussed with his mom on 1/10.  Status: Inpatient Level of care:  Progressive   Patient is from: Home Needs to continue in-hospital care: Hypotensive this morning.  Unable to discharge today.  Hopefully tomorrow if blood pressure improves.   Diet:  Diet Order             Diet regular Room service appropriate? Yes; Fluid consistency: Thin; Fluid restriction: 1500 mL Fluid  Diet effective now                   Scheduled Meds:  (feeding supplement) PROSource Plus  30 mL Oral BID BM   apixaban   5 mg Oral BID   bacitracin    Topical Daily   Chlorhexidine  Gluconate Cloth  6 each Topical Q0600   darbepoetin (ARANESP ) injection - DIALYSIS  100 mcg Subcutaneous Q Sun-1800   dextrose   1 Tube Oral Once   dextrose   25 mL Intravenous Once   gabapentin   100 mg Oral TID   Gerhardt's butt cream   Topical BID   heparin  sodium (porcine)       influenza vac split trivalent PF  0.5 mL Intramuscular Tomorrow-1000   insulin  aspart  0-6 Units Subcutaneous TID WC   insulin  glargine-yfgn  6 Units Subcutaneous Daily   loratadine   10 mg Oral Daily   midodrine   10 mg Oral Q M,W,F-HD   pantoprazole   40 mg Oral Daily   povidone-Iodine    Topical Q0600   QUEtiapine   12.5 mg Oral q morning   QUEtiapine   25 mg Oral QHS   senna-docusate  2 tablet Oral BID   sevelamer  carbonate  2,400 mg Oral TID WC    sodium zirconium cyclosilicate   10 g Oral TID    PRN meds: acetaminophen , albumin  human, clonazepam , diphenhydrAMINE , diphenhydrAMINE -zinc  acetate, Gerhardt's butt cream, heparin  sodium (porcine), hydrALAZINE , HYDROmorphone  (DILAUDID ) injection, loperamide , metoprolol  tartrate, ondansetron  (ZOFRAN ) IV, oxyCODONE , polyethylene glycol   Infusions:   albumin  human 60 mL/hr at 10/09/23 0356   vancomycin  Stopped (10/08/23 1500)    Antimicrobials: Anti-infectives (From admission, onward)    Start     Dose/Rate Route Frequency Ordered Stop   09/24/23 1200  vancomycin  (VANCOREADY) IVPB 500 mg/100 mL        500 mg 100 mL/hr over 60 Minutes Intravenous Every M-W-F (Hemodialysis) 09/21/23 1246 10/29/23 1159   09/20/23 1200  vancomycin  (VANCOCIN ) 750 mg in sodium chloride  0.9 % 250 mL IVPB  Status:  Discontinued        750 mg 265 mL/hr over 60 Minutes Intravenous Every M-W-F (Hemodialysis) 09/20/23 0446 09/21/23 1246   09/20/23 1045  vancomycin  (VANCOCIN ) 750 mg in sodium chloride  0.9 % 250 mL IVPB        750 mg 265 mL/hr over 60 Minutes Intravenous  Once 09/20/23 0959 09/20/23 1226   09/20/23 0945  vancomycin  (VANCOREADY) IVPB 750 mg/150 mL  Status:  Discontinued        750 mg 150 mL/hr over 60 Minutes Intravenous  Once 09/20/23 0855 09/20/23 0959   09/10/23 1200  vancomycin  (VANCOREADY) IVPB 750 mg/150 mL  Status:  Discontinued        750 mg 150 mL/hr over 60 Minutes Intravenous Every M-W-F (Hemodialysis) 09/09/23 2030 09/20/23 0446   09/09/23 2200  linezolid  (ZYVOX ) tablet 600 mg  Status:  Discontinued        600 mg Oral Every 12 hours 09/09/23 2035 09/10/23 0946       Objective: Vitals:   10/11/23 1030 10/11/23 1045  BP: (!) 75/37 95/62  Pulse: (!) 106 (!) 107  Resp: 12 (!) 9  Temp:    SpO2: 94% 99%    Intake/Output Summary (Last 24 hours) at 10/11/2023 1104 Last data filed at 10/10/2023 2300 Gross per 24 hour  Intake 240 ml  Output --  Net 240 ml    Filed Weights    10/10/23 0546 10/11/23 0610 10/11/23 0820  Weight: 92.9 kg 96.2 kg 96.2 kg   Weight change: 3.3 kg Body mass index is 33.22 kg/m.   Physical Exam: General exam: Pleasant, middle-aged male.  Not in pain Skin: No rashes, lesions or ulcers. HEENT: Atraumatic, normocephalic, no obvious bleeding.  Right craniotomy site with bandage on Lungs: Clear to auscultation bilaterally,  CVS: S1, S2, no murmur,   GI/Abd: Soft, nontender, nondistended, bowel sound present,   CNS: Somnolent, opens eyes to answer some questions  psychiatry: Mood appropriate,  Extremities: No pedal edema, no calf tenderness.  Prior left BKA status.  Right toe infection noted.  Data Review: I have personally reviewed the laboratory data and studies available.  F/u labs  Unresulted Labs (From admission, onward)    None       Total time spent in review of labs and imaging, patient evaluation, formulation of plan, documentation and communication with family: 45 minutes  Signed, Chapman Rota, MD [facility] 10/11/2023

## 2023-10-11 NOTE — Progress Notes (Signed)
   10/11/23 1244  Vitals  Temp 97.7 F (36.5 C)  Pulse Rate (!) 118  Resp 17  BP (!) 120/42  SpO2 99 %  O2 Device Room Air  Weight 93.7 kg  Type of Weight Post-Dialysis  Oxygen  Therapy  Patient Activity (if Appropriate) In bed  Pulse Oximetry Type Continuous  Oximetry Probe Site Changed No  Post Treatment  Dialyzer Clearance Lightly streaked  Hemodialysis Intake (mL) 0 mL  Liters Processed 84  Fluid Removed (mL) 2500 mL  Tolerated HD Treatment Yes   Received patient in bed to unit.  Alert and oriented.  Informed consent signed and in chart.   TX duration:3.30  Patient tolerated well.  Transported back to the room  Alert, without acute distress.  Hand-off given to patient's nurse.   [facility] Access issues: no complications  Total UF removed: 2500 Medication(s) given: none   [name]

## 2023-10-11 NOTE — Plan of Care (Addendum)
 Patient remains on MC-3E at time of writing. Adult oral care protocol initiated by this RN, per protocol.  No acute changes overnight.  Problem: Education: Goal: Knowledge of General Education information will improve Description: Including pain rating scale, medication(s)/side effects and non-pharmacologic comfort measures Outcome: Progressing   Problem: Health Behavior/Discharge Planning: Goal: Ability to manage health-related needs will improve Outcome: Progressing   Problem: Clinical Measurements: Goal: Ability to maintain clinical measurements within normal limits will improve Outcome: Progressing Goal: Will remain free from infection Outcome: Progressing Goal: Diagnostic test results will improve Outcome: Progressing Goal: Cardiovascular complication will be avoided Outcome: Progressing   Problem: Activity: Goal: Risk for activity intolerance will decrease Outcome: Progressing   Problem: Coping: Goal: Level of anxiety will decrease Outcome: Progressing   Problem: Skin Integrity: Goal: Risk for impaired skin integrity will decrease Outcome: Progressing   Problem: Education: Goal: Ability to describe self-care measures that may prevent or decrease complications (Diabetes Survival Skills Education) will improve Outcome: Progressing Goal: Individualized Educational Video(s) Outcome: Progressing   Problem: Coping: Goal: Ability to adjust to condition or change in health will improve Outcome: Progressing   Problem: Fluid Volume: Goal: Ability to maintain a balanced intake and output will improve Outcome: Progressing   Problem: Health Behavior/Discharge Planning: Goal: Ability to identify and utilize available resources and services will improve Outcome: Progressing Goal: Ability to manage health-related needs will improve Outcome: Progressing   Problem: Metabolic: Goal: Ability to maintain appropriate glucose levels will improve Outcome: Progressing   Problem:  Skin Integrity: Goal: Risk for impaired skin integrity will decrease Outcome: Progressing   Problem: Tissue Perfusion: Goal: Adequacy of tissue perfusion will improve Outcome: Progressing   Problem: Education: Goal: Knowledge of disease and its progression will improve Outcome: Progressing Goal: Individualized Educational Video(s) Outcome: Progressing   Problem: Fluid Volume: Goal: Compliance with measures to maintain balanced fluid volume will improve Outcome: Progressing   Problem: Health Behavior/Discharge Planning: Goal: Ability to manage health-related needs will improve Outcome: Progressing   Problem: Nutritional: Goal: Ability to make healthy dietary choices will improve Outcome: Progressing   Problem: Clinical Measurements: Goal: Complications related to the disease process, condition or treatment will be avoided or minimized Outcome: Progressing

## 2023-10-11 NOTE — Progress Notes (Addendum)
 10/11/23  1935 hours: RN at bedside for change of shift handoff and introduction to the patient. The patient yells unprovoked at this RN stating I do not care who the f*ck you are! Go get the charge nurse! This RN leaves the room and Charge Nurse Zachary notified. Call bell is in within patient's reach; bed is in lowest position; bed alarm activated.  2120 hours: RN at patient's bedside for rounding. Patient requests to take his PM meds at ~ 2145 hrs; patient reassured we will plan for PM meds as close to 2145 hrs as possible. Patient denies other concerns at this time. Call bell is in within patient's reach; bed is in lowest position;  bed alarm activated.  2200 hours: RN back at patient's bedside for PM meds. Patient declines multiple scheduled meds (see MAR); remaining meds are administered (see MAR). Patient declines to participate with certain components of head-to-toe assessment at this time. Call bell is in within patient's reach; bed is in lowest position; bed alarm activated.  2230 hours: RN at bedside for pain reassessment. Patient is resting in bd with eyes closed. Call bell is in within patient's reach; bed is in lowest position; bed alarm activated.  0000 hours: RN Called to bedside to assist patient off of bedpan. Patient is assisted off bedpan; BM noted. Patient is cleaned, repositioned, and given prn pain meds per his request. Patient remains belligerent during these interactions: i.e.: throwing items in the floor and demanding that staff pick them up, cursing at staff because the hospital does not supply briefs nor incontinent wipes. Verbal reassurance provided by this RN to which the patient responds f*ck you!. The patient also declines to participate with certain components of head-to-toe assessment at this time. As this RN leaves the room, the patient's call bed is placed within the patient's reach and bed is in lowest position with bed alarm activated.  0200 hours: Patient is  resting in bed with his eyes closed. Call bell is in within patient's reach; bed is in lowest position; bed alarm activated.  0235 hours: RN called to the bedside as patient is requesting blankets and tylenol . PRN meds are given per orders and blankets are provided. The RN left off the interaction by encouraging the patient to try to get some sleep to which the patient responded F*ck you! I'm a 43 year old man and I'll do whatever the f*ck I want to do! Nobody is controlling me! (The patient is 43 years old). As this RN leaves the room, the patient's call bell is within the patient's reach and bed is in lowest position with bed alarm activated.   0320 hours: RN at bedside for pain reassessment. Patient is resting in bd with eyes closed. Call bell is in within patient's reach; bed is in lowest position; bed alarm activated.  0400 hours: Patient is resting in bed with his eyes closed. Call bell is in within patient's reach; bed is in lowest position; bed alarm activated.  0550 hours: RN at bedside to assist patient to bedpan. BM X 1. CHG bath and betadine  intervention for right foot completed at this time also. As this RN leaves the room, the patient's call bed is placed within the patient's reach and bed is in lowest position with bed alarm activated.  9374 hours: RN again at patient's bedside, this time for SSI related to CBG = 425 mg/dL. Secure chat is also sent by this RN to Dr. Shona at this time: FYI - CBG = 425 mg/dl this AM,  and there's an order for CBG > 400, Give 6 units and call MD. Going to give the SSI now, is there anything else you'd like for us  to do? Awaiting new orders at this time.  0640 hours: RN at patient bedside for PRN pain meds (see MAR). As this RN leaves the room, the patient's call bed is placed within the patient's reach and bed is in lowest position with bed alarm activated.  [phone] hours: New orders acknowledged at this time; awaiting pharmacy verification.

## 2023-10-12 ENCOUNTER — Inpatient Hospital Stay: Payer: Medicare HMO | Admitting: Internal Medicine

## 2023-10-12 DIAGNOSIS — R7881 Bacteremia: Secondary | ICD-10-CM | POA: Diagnosis not present

## 2023-10-12 DIAGNOSIS — N250 Renal osteodystrophy: Secondary | ICD-10-CM | POA: Diagnosis not present

## 2023-10-12 DIAGNOSIS — D631 Anemia in chronic kidney disease: Secondary | ICD-10-CM | POA: Diagnosis not present

## 2023-10-12 DIAGNOSIS — Z992 Dependence on renal dialysis: Secondary | ICD-10-CM | POA: Diagnosis not present

## 2023-10-12 DIAGNOSIS — I330 Acute and subacute infective endocarditis: Secondary | ICD-10-CM | POA: Diagnosis not present

## 2023-10-12 DIAGNOSIS — I120 Hypertensive chronic kidney disease with stage 5 chronic kidney disease or end stage renal disease: Secondary | ICD-10-CM | POA: Diagnosis not present

## 2023-10-12 DIAGNOSIS — I38 Endocarditis, valve unspecified: Secondary | ICD-10-CM | POA: Diagnosis not present

## 2023-10-12 DIAGNOSIS — B9562 Methicillin resistant Staphylococcus aureus infection as the cause of diseases classified elsewhere: Secondary | ICD-10-CM | POA: Diagnosis not present

## 2023-10-12 DIAGNOSIS — N186 End stage renal disease: Secondary | ICD-10-CM | POA: Diagnosis not present

## 2023-10-12 LAB — GLUCOSE, CAPILLARY
Glucose-Capillary: 425 mg/dL — ABNORMAL HIGH (ref 70–99)
Glucose-Capillary: 153 mg/dL — ABNORMAL HIGH (ref 70–99)
Glucose-Capillary: 83 mg/dL (ref 70–99)
Glucose-Capillary: 119 mg/dL — ABNORMAL HIGH (ref 70–99)

## 2023-10-12 MED ORDER — INSULIN GLARGINE-YFGN 100 UNIT/ML ~~LOC~~ SOLN
10.0000 [IU] | Freq: Every day | SUBCUTANEOUS | Status: DC
  Filled 2023-10-12: qty 0.1

## 2023-10-12 MED ORDER — INSULIN ASPART 100 UNIT/ML IJ SOLN
15.0000 [IU] | INTRAMUSCULAR | Status: AC
  Administered 2023-10-12: 15 [IU] via SUBCUTANEOUS

## 2023-10-12 MED ORDER — INSULIN GLARGINE-YFGN 100 UNIT/ML ~~LOC~~ SOLN
6.0000 [IU] | Freq: Every day | SUBCUTANEOUS | Status: DC
  Administered 2023-10-12 – 2023-10-15 (×4): 6 [IU] via SUBCUTANEOUS
  Filled 2023-10-12 (×4): qty 0.06

## 2023-10-12 MED ORDER — INSULIN GLARGINE-YFGN 100 UNIT/ML ~~LOC~~ SOLN: 6.0000 [IU] | Freq: Every day | SUBCUTANEOUS | Status: DC

## 2023-10-12 NOTE — Progress Notes (Signed)
 PHARMACY CONSULT NOTE FOR:  OUTPATIENT  PARENTERAL ANTIBIOTIC THERAPY (OPAT)  Informational as the patient will receive antibiotics with hemodialysis outpatient.   Indication: Disseminated MRSA infection  Regimen: Vancomycin  500 mg/HD-every Tuesday, Thursday and Saturday. Note that if patient has outpatient hemodialysis session on Wed 10/13/23, he will need a Vancomycin  500 mg dose given after HD on 10/13/23.  Then continue as scheduled every HD qTTSat.  End date: 10/27/23    Thank you for allowing pharmacy to be a part of this patient's care.  Almarie Lunger, PharmD, BCPS, BCIDP Infectious Diseases Clinical Pharmacist 10/12/2023 12:35 PM   **Pharmacist phone directory can now be found on [email or web address] ([email or web address]).  Listed under [facility].

## 2023-10-12 NOTE — Progress Notes (Signed)
 Met w/ pt again. Spent approx 30 min w/ pt; he continues to be loud, use profanity and talking non-stop about his concerns.  Attentive listening provided. RE-directed pt multiple times to avoid yelling and using loud cuss words. He verbally disregarded my request.  I explained our policy re: not recording staff. He again, with use of curse words he didn't care what our policy was or what I said.  Ongoing re-direction being provided.  Tried to re-direct conversation about upcoming discharge. He said he didn't care about that. He remembers certain details about conversations and then gets very animated in his story telling and use of language. Inquired what I could do to help.  He did not provide anything direct other than continuing to cuss about his experience and the staff; security has been called mulitiple times to support the staff in this ongoing verbal abuse. MD aware of this ongoing concern. ---ONA

## 2023-10-12 NOTE — TOC Progression Note (Signed)
 Transition of Care [facility]) - Progression Note    Patient Details  Name: [name] MRN: [id number] Date of Birth: 25-Nov-1980  Transition of Care [facility]) CM/SW Contact  [name], [place] Phone Number: 10/12/2023, 12:43 PM  Clinical Narrative:   Per [name], facility is unable to accept patient today due to him exhibiting behaviors. Per [name], [facility] will need to continue to observe pts behaviors before accepting him to their facility.   CSW updated pt that he will not be discharging today - [name] informed CSW that he wants a private room. CSW will update facility as that he wants a private room now instead of semi-private.   [employer] Auth will need to be restarted as it expires today 1/14.   Expected Discharge Plan: Skilled Nursing Facility Barriers to Discharge: Continued Medical Work up  Expected Discharge Plan and Services                                               Social Determinants of Health (SDOH) Interventions SDOH Screenings   Food Insecurity: No Food Insecurity (09/09/2023)  Housing: Unknown (09/09/2023)  Transportation Needs: No Transportation Needs (09/09/2023)  Utilities: Not At Risk (09/09/2023)  Alcohol Screen: Low Risk  (02/15/2023)  Depression (PHQ2-9): Low Risk  (02/15/2023)  Financial Resource Strain: Low Risk  (02/15/2023)  Physical Activity: Inactive (02/15/2023)  Social Connections: Socially Integrated (02/15/2023)  Stress: No Stress Concern Present (02/15/2023)  Tobacco Use: Low Risk  (09/12/2023)  Recent Concern: Tobacco Use - Medium Risk (09/01/2023)   Received from [facility]    Readmission Risk Interventions     No data to display

## 2023-10-12 NOTE — Progress Notes (Signed)
 CN administered PO lokelma mixed with sprite soda per patient's request. Patient tolerated 50% of solution, relayed to CN that will drink the rest in 15 mins. CN verbalize understanding.

## 2023-10-12 NOTE — Progress Notes (Signed)
 PROGRESS NOTE  [name] Sar  DOB: Feb 07, 1981  PCP: Celestia Rosaline SQUIBB, NP FMW:[id number]  DOA: 09/09/2023  LOS: 33 days  Hospital Day: 34  Brief narrative: DARTANION TEO is a 43 y.o. male with PMH significant for ESRD HD, DM 1, gastroparesis, neuropathy, diabetic foot s/p prior left AKA, recent MRSA endocarditis, stroke 10/11, patient was hospitalized for DKA.  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   11/29, patient was transferred to [facility] for MV repair.  While at [facility] he underwent cardiac cath showed non obstructive disease, craniotomy with abscess drainage on 09/01/23.  Course over there was complicated by subarachnoid hemorrhage while on IV heparin  as well as right third toe distal necrosis.  He was seen by podiatry, but family wanted conservative management. Since his tertiary care needs have been completed, he was transferred back to [facility] for further management on 12/12. PTOT  recommending SNF and awaiting placement.   Subjective: Patient was seen and examined this morning. Lying on bed.  Upset that he is still not discharged. Caseworker and dialysis nurse navigator involved.  SNF noted some behavioral issues in chart review and hence they are not accepting him today.  Assessment and plan: Hypotension  ESRD on HD Currently on midodrine  MWF. Metoprolol  on hold. Post discharge, dialysis schedule per nephrology Caseworker and renal navigator working on outpatient chair arrangement  MRSA TV/MV endocarditis S/p MV repair at [facility] -11/29 10/12, blood cultures positive for MRSA. 10/13, TTE showed LVEF of 25-30% with concern for AV/MV abnormality  10/16 and 10/22, TEE showed MV/TV vegetations.  11/29, s/p MV repair at [facility]  Currently on IV vancomycin .  Per ID recommendation, to complete 8 weeks of  treatment EOT 1/29. Recommendation for outpatient ID follow-up with appointment scheduled for 10/12/2023. May need that rescheduled if patient is still remains high in the hospital. While at [facility], patient underwent a left heart catheterization which was significant for nonobstructive disease.  Acute CVA Secondary to septic emboli.   10/17, MRI showed large right MCA branch infarct.  11/25, MRA head showed significant for large necrotic appearing lesion within the right frontal lobe within area of the prior right MCA territory infarct with vasogenic edema and 8 mm leftward midline shift.  Stroke workup completed.   Neurology recommendations for Eliquis .  PT/OT recommendations for SNF.   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  12/4, while at [facility], patient was evaluated by neurosurgery who performed a craniotomy for abscess drainage. Follow-up as an outpatient  Prostate fluid collection/abscess Seen by urology at [facility].  Per their assessment, fluid collection was secondary to dilated seminal vesicle with no surgical intervention recommended. Infectious disease with continued concern for possible abscess. Patient covered with vancomycin  with recommendation for outpatient urology follow-up.  Type 1 diabetes mellitus A1c 8.9 on 07/10/2023 Currently on Semglee  6 units daily and SSI with Accu-Cheks Blood sugar level mostly under 200.  It seems patient did not get insulin  yesterday as he was in dialysis in the morning.  Blood sugar level over 400 this morning.  Insulin  plan resumed.  Expect improvement with the same regimen. Recent Labs  Lab 10/11/23 1619 10/11/23 1810 10/11/23 2032 10/12/23 0607 10/12/23 1157  GLUCAP 142* 223* 273* 425* 153*     Anemia of chronic kidney disease Hemoglobin stable. Recent Labs    10/01/23  1205 10/02/23 0901 10/04/23 0933 10/06/23 0333 10/08/23 0920  HGB 7.4* 7.9* 8.0* 8.8* 7.9*  MCV 93.9 93.6 96.1 93.3 95.1    Acute left upper extremity DVT Continue Eliquis    Right third toe necrosis H/o prior left BKA Per documentation, patient/family opted for conservative management and declined surgical management. Patient was seen by PT/OT with recommendation for SNF on discharge.   Mild cognitive dysfunction Behavioral changes Likely secondary to brain abscess, craniotomy Currently stable on Seroquel    Pressure injury Left buttock. Not present on admission.  Goals of care   Code Status: Full Code    DVT prophylaxis:   apixaban  (ELIQUIS ) tablet 5 mg   Antimicrobials: IV antibiotics till 1/31 Fluid: None Consultants: Nephrology Family Communication: None at bedside.   Status: Inpatient Level of care:  Telemetry Medical   Patient is from: Home Needs to continue in-hospital care:  SNF not taking today because of behavior issues today.   Diet:  Diet Order             Diet regular Room service appropriate? Yes; Fluid consistency: Thin; Fluid restriction: 1500 mL Fluid  Diet effective now                   Scheduled Meds:  (feeding supplement) PROSource Plus  30 mL Oral BID BM   apixaban   5 mg Oral BID   bacitracin    Topical Daily   Chlorhexidine  Gluconate Cloth  6 each Topical Q0600   darbepoetin (ARANESP ) injection - DIALYSIS  100 mcg Subcutaneous Q Sun-1800   dextrose   1 Tube Oral Once   dextrose   25 mL Intravenous Once   gabapentin   100 mg Oral TID   Gerhardt's butt cream   Topical BID   influenza vac split trivalent PF  0.5 mL Intramuscular Tomorrow-1000   insulin  aspart  0-6 Units Subcutaneous TID WC   insulin  glargine-yfgn  6 Units Subcutaneous Daily   loratadine   10 mg Oral Daily   midodrine   10 mg Oral Q M,W,F-HD   pantoprazole   40 mg Oral Daily   povidone-Iodine    Topical Q0600   QUEtiapine   12.5 mg Oral q morning   QUEtiapine   25 mg Oral QHS   senna-docusate  2 tablet Oral BID   sevelamer  carbonate  2,400 mg Oral TID WC   sodium zirconium cyclosilicate    10 g Oral TID    PRN meds: acetaminophen , albumin  human, clonazepam , diphenhydrAMINE , diphenhydrAMINE -zinc  acetate, Gerhardt's butt cream, hydrALAZINE , HYDROmorphone  (DILAUDID ) injection, loperamide , metoprolol  tartrate, ondansetron  (ZOFRAN ) IV, mouth rinse, oxyCODONE , polyethylene glycol   Infusions:   albumin  human 60 mL/hr at 10/12/23 0600   [START ON 10/13/2023] vancomycin       Antimicrobials: Anti-infectives (From admission, onward)    Start     Dose/Rate Route Frequency Ordered Stop   10/13/23 1200  vancomycin  (VANCOREADY) IVPB 500 mg/100 mL        500 mg 100 mL/hr over 60 Minutes Intravenous Every M-W-F (Hemodialysis) 10/11/23 1517 10/29/23 1159   10/12/23 1200  vancomycin  (VANCOREADY) IVPB 500 mg/100 mL  Status:  Discontinued        500 mg 100 mL/hr over 60 Minutes Intravenous Every T-Th-Sa (Hemodialysis) 10/11/23 1514 10/11/23 1517   09/24/23 1200  vancomycin  (VANCOREADY) IVPB 500 mg/100 mL  Status:  Discontinued        500 mg 100 mL/hr over 60 Minutes Intravenous Every M-W-F (Hemodialysis) 09/21/23 1246 10/11/23 1514   09/20/23 1200  vancomycin  (VANCOCIN ) 750 mg in sodium chloride  0.9 %  250 mL IVPB  Status:  Discontinued        750 mg 265 mL/hr over 60 Minutes Intravenous Every M-W-F (Hemodialysis) 09/20/23 0446 09/21/23 1246   09/20/23 1045  vancomycin  (VANCOCIN ) 750 mg in sodium chloride  0.9 % 250 mL IVPB        750 mg 265 mL/hr over 60 Minutes Intravenous  Once 09/20/23 0959 09/20/23 1226   09/20/23 0945  vancomycin  (VANCOREADY) IVPB 750 mg/150 mL  Status:  Discontinued        750 mg 150 mL/hr over 60 Minutes Intravenous  Once 09/20/23 0855 09/20/23 0959   09/10/23 1200  vancomycin  (VANCOREADY) IVPB 750 mg/150 mL  Status:  Discontinued        750 mg 150 mL/hr over 60 Minutes Intravenous Every M-W-F (Hemodialysis) 09/09/23 2030 09/20/23 0446   09/09/23 2200  linezolid  (ZYVOX ) tablet 600 mg  Status:  Discontinued        600 mg Oral Every 12 hours 09/09/23 2035 09/10/23  0946       Objective: Vitals:   10/12/23 0550 10/12/23 0750  BP:  (!) 159/90  Pulse: 92 66  Resp:  18  Temp:    SpO2: 92% 92%    Intake/Output Summary (Last 24 hours) at 10/12/2023 1316 Last data filed at 10/12/2023 0600 Gross per 24 hour  Intake 240 ml  Output --  Net 240 ml    Filed Weights   10/11/23 0820 10/11/23 1244 10/12/23 0618  Weight: 96.2 kg 93.7 kg 77.7 kg   Weight change: 0 kg Body mass index is 26.83 kg/m.   Physical Exam: General exam: Pleasant, middle-aged male.  Not in pain Skin: No rashes, lesions or ulcers. HEENT: Atraumatic, normocephalic, no obvious bleeding.  Right craniotomy site with bandage on Lungs: Clear to auscultation bilaterally,  CVS: S1, S2, no murmur,   GI/Abd: Soft, nontender, nondistended, bowel sound present,   CNS: Somnolent, opens eyes to answer some questions  psychiatry: Mood appropriate,  Extremities: No pedal edema, no calf tenderness.  Prior left BKA status.  Right toe infection noted.  Data Review: I have personally reviewed the laboratory data and studies available.  F/u labs  Unresulted Labs (From admission, onward)     Start     Ordered   10/13/23 0500  Vancomycin , random  Tomorrow morning,   R       Comments: HD nurse to draw vancomycin  level in HD , pre- dialysis on 10/13/23.   Question:  Specimen collection method  Answer:  Lab=Lab collect   10/12/23 1247            Total time spent in review of labs and imaging, patient evaluation, formulation of plan, documentation and communication with family: 45 minutes  Signed, Chapman Rota, MD [facility] 10/12/2023

## 2023-10-12 NOTE — Progress Notes (Signed)
 PT Cancellation Note  Patient Details Name: [name] MRN: [id number] DOB: [birth year]   Cancelled Treatment:    Reason Eval/Treat Not Completed: Patient at procedure or test/unavailable Patient attempted x3 this am, with staff in room each time. Will re-attempt this pm if time allows.    Hudson Lehmkuhl 10/12/2023, 11:40 AM

## 2023-10-12 NOTE — Progress Notes (Signed)
 [facility] Progress Note   Subjective:    Patient seen and examined at bedside.   Agitated and tangential about his care HD yesterday 2.5L UF did not req albumin  but did rec midodrine  SNF DC in process Reminded pt he will be THS at [facility] upon discharge  Objective Vitals:   10/12/23 0549 10/12/23 0550 10/12/23 0618 10/12/23 0750  BP: (!) 136/91   (!) 159/90  Pulse: 99 92  66  Resp: 19   18  Temp: 99 F (37.2 C)     TempSrc: Oral     SpO2: 93% 92%  92%  Weight:   77.7 kg   Height:       Physical Exam General:chronically ill appearing male in NAD Heart: normal rate, regular, 2/6 murmur Lungs:mostly CTAB, breath sounds decreased Abdomen:soft, +tight abdominal edema Extremities:no LE edema Dialysis Access: L fem [facility]   Filed Weights   10/11/23 0820 10/11/23 1244 10/12/23 0618  Weight: 96.2 kg 93.7 kg 77.7 kg    Intake/Output Summary (Last 24 hours) at 10/12/2023 1048 Last data filed at 10/12/2023 0600 Gross per 24 hour  Intake 240 ml  Output 2500 ml  Net -2260 ml    Additional Objective Labs: Basic Metabolic Panel: Recent Labs  Lab 10/06/23 0333 10/08/23 0920 10/10/23 1106  NA 137 136 136  K 5.6* 5.4* 6.2*  CL 94* 95* 96*  CO2 26 26 23   GLUCOSE 291* 189* 188*  BUN 49* 45* 46*  CREATININE 6.27* 6.02* 5.88*  CALCIUM  9.4 9.0 9.3  PHOS  --  9.2*  --    Liver Function Tests: Recent Labs  Lab 10/08/23 0920  ALBUMIN  3.1*    CBC: Recent Labs  Lab 10/06/23 0333 10/08/23 0920  WBC 6.0 6.2  HGB 8.8* 7.9*  HCT 29.4* 27.0*  MCV 93.3 95.1  PLT 217 236   Blood Culture    Component Value Date/Time   SDES BLOOD SITE NOT SPECIFIED 08/25/2023 1706   SPECREQUEST  08/25/2023 1706    BOTTLES DRAWN AEROBIC ONLY Blood Culture results may not be optimal due to an inadequate volume of blood received in culture bottles   CULT  08/25/2023 1706    NO GROWTH 5 DAYS Performed at [facility], [address], [place] , [name] [address]     REPTSTATUS 08/30/2023 FINAL 08/25/2023 1706    Studies/Results: No results found.   Medications:  albumin  human 60 mL/hr at 10/12/23 0600   [START ON 10/13/2023] vancomycin       (feeding supplement) PROSource Plus  30 mL Oral BID BM   apixaban   5 mg Oral BID   bacitracin    Topical Daily   Chlorhexidine  Gluconate Cloth  6 each Topical Q0600   darbepoetin (ARANESP ) injection - DIALYSIS  100 mcg Subcutaneous Q Sun-1800   dextrose   1 Tube Oral Once   dextrose   25 mL Intravenous Once   gabapentin   100 mg Oral TID   Gerhardt's butt cream   Topical BID   influenza vac split trivalent PF  0.5 mL Intramuscular Tomorrow-1000   insulin  aspart  0-6 Units Subcutaneous TID WC   insulin  glargine-yfgn  6 Units Subcutaneous Daily   loratadine   10 mg Oral Daily   midodrine   10 mg Oral Q M,W,F-HD   pantoprazole   40 mg Oral Daily   povidone-Iodine    Topical Q0600   QUEtiapine   12.5 mg Oral q morning   QUEtiapine   25 mg Oral QHS   senna-docusate  2 tablet  Oral BID   sevelamer  carbonate  2,400 mg Oral TID WC   sodium zirconium cyclosilicate   10 g Oral TID    Dialysis Orders: MWF - [facility] 4hr, 400/A1.5, EDW 77kg, L femoral TDC, 2K/3Ca bath, no heparin    Assessment/Plan: MRSA bacteremia with MV/TV endocarditis + brain abscess:  Not a candidate for valve surgery here, S/p line holiday and brain abscess drainage (at [facility]). Continues on extended Vanc course - will be on Vanc 750mcg IV q HD until 10/27/23. MRSA brain abscess: S/p transfer to [facility] and drainage 08/31/24 Acute B CVA/septic emboli Acute LUE DVT: On Eliquis  ESRD: Usual MWF schedule, Transitioning to new TTS schedule this week.  HD 1/13 Mon 2/2 hyperkalemia.  Have confirmed if DC today can rec HD Wed and Thurs as outpt this week.   Dialysis access: Has L femoral TDC, Hx B subclavian occlusions. HTN/volume: BP stable. Lots of LUE edema + flank edema, CT with diffuse anasarca. Well above prior dry weight. Max UF as  tolerated with HD.   Anemia of ESRD: Hgb 7.9- Increase Aranesp  150mcg q Sunday while here. No Iv iron  while on abx. Secondary HPTH: CorrCa high, Phos high. Hold calcitriol , continue sevelamer  as binder but ^ dose to 3/meals. Hyperkalemia: Despite Lokelma  10g TID (refusing at times), remains an issue.   Nutrition: Alb low, adding protein supplements. T1D: Per primary. Dispo: SNF being arranged. Since has been hospitalized for so long, his outpatient clinic temporarily fillled his prior chair - Plan [facility] until he can get back to [facility].   Bernardino KATHEE Gasman, MD  [facility] 10/12/2023,10:48 AM  LOS: 33 days

## 2023-10-13 DIAGNOSIS — N250 Renal osteodystrophy: Secondary | ICD-10-CM | POA: Diagnosis not present

## 2023-10-13 DIAGNOSIS — N186 End stage renal disease: Secondary | ICD-10-CM | POA: Diagnosis not present

## 2023-10-13 DIAGNOSIS — R7881 Bacteremia: Secondary | ICD-10-CM | POA: Diagnosis not present

## 2023-10-13 DIAGNOSIS — B9562 Methicillin resistant Staphylococcus aureus infection as the cause of diseases classified elsewhere: Secondary | ICD-10-CM | POA: Diagnosis not present

## 2023-10-13 DIAGNOSIS — I120 Hypertensive chronic kidney disease with stage 5 chronic kidney disease or end stage renal disease: Secondary | ICD-10-CM | POA: Diagnosis not present

## 2023-10-13 DIAGNOSIS — I38 Endocarditis, valve unspecified: Secondary | ICD-10-CM | POA: Diagnosis not present

## 2023-10-13 DIAGNOSIS — I330 Acute and subacute infective endocarditis: Secondary | ICD-10-CM | POA: Diagnosis not present

## 2023-10-13 DIAGNOSIS — Z992 Dependence on renal dialysis: Secondary | ICD-10-CM | POA: Diagnosis not present

## 2023-10-13 DIAGNOSIS — D631 Anemia in chronic kidney disease: Secondary | ICD-10-CM | POA: Diagnosis not present

## 2023-10-13 LAB — GLUCOSE, CAPILLARY
Glucose-Capillary: 155 mg/dL — ABNORMAL HIGH (ref 70–99)
Glucose-Capillary: 207 mg/dL — ABNORMAL HIGH (ref 70–99)
Glucose-Capillary: 238 mg/dL — ABNORMAL HIGH (ref 70–99)
Glucose-Capillary: 174 mg/dL — ABNORMAL HIGH (ref 70–99)

## 2023-10-13 MED ORDER — VANCOMYCIN HCL 500 MG/100ML IV SOLN
500.0000 mg | INTRAVENOUS | Status: DC
  Administered 2023-10-14: 500 mg via INTRAVENOUS
  Filled 2023-10-13: qty 100

## 2023-10-13 NOTE — Progress Notes (Signed)
 Occupational Therapy Treatment Patient Details Name: [name] MRN: [id number] DOB: 1980/12/14 Today's Date: 10/13/2023   History of present illness 43 y.o. male presents to [facility] 09/09/23 as a transfer from [facility] for further management of endocarditis. Pt originally was admitted to [facility] 07/09/23 for DKA and sepsis after being found on the ground. 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 10/17 pt had a R MCA, L occipital, and B cerebellum CVA w/ septic emboli. ICU admit 10/18 w/ intubation 10/18-10/24. Pt was transferred to [facility] for MV repair on 11/29. [facility], pt had cardiac cath w/ non obstructive disease, SAH, a craniotomy w/ abscess drainage on 12/4, and R third toe distal necrosis. PMH: chronic L AKA, MRSA, IDDM, ESRD on HD MWF, HTN, TEE positive for vegetation on multiple valves, cellulitis, medical noncompliance, narcotic dependence, DM with gastroparesis, L internal jugular DVT.   OT comments  Pt supine in bed upon arrival and agreeable to participation in OT/PT session. OT focused on training in techniques for increased safety and independence with UB dressing and R UE AROM therapeutic exercises (see details below) while sitting EOB to increase UE and core strength, sitting balance, sitting tolerance, and activity tolerance. Pt demonstrating ability to complete UB dressing with Min to Mod assist with cues and demonstrating improvements in sequencing ability and sitting balance/tolerance this session. Pt requiring Supervision to Mod assist to maintain sitting balance during functional an therapeutic tasks this session. Pt participated well in session and is making slow but consistent progress toward goals. Pt requested OT let Social Worker know that he would like Child psychotherapist to discuss POC and discharge planning with him and his mother. OT relayed message to Child psychotherapist through [employer]. Pt will benefit from continued acute skilled OT services to  address deficits outlined below and increase safety and independence with functional tasks. Post acute discharge, pt will benefit from intensive inpatient skilled rehab services < 3 hours per day to maximize rehab potential.       If plan is discharge home, recommend the following:  Two people to help with walking and/or transfers;A lot of help with bathing/dressing/bathroom;Assistance with cooking/housework;Assistance with feeding;Help with stairs or ramp for entrance;Assist for transportation;Direct supervision/assist for financial management;Direct supervision/assist for medications management   Equipment Recommendations  Other (comment) (defer to next level of care)    Recommendations for Other Services      Precautions / Restrictions Precautions Precautions: Fall;Other (comment) Precaution Comments: Contact precs; L AKA, L hemiparesis and edema, L neglect, bowel incontinence. L femoral HD cath Restrictions Weight Bearing Restrictions Per Provider Order: Yes LLE Weight Bearing Per Provider Order: Non weight bearing Other Position/Activity Restrictions: Pt has sling for LUE when mobilizing OOB; LLE prosthetic not fitting       Mobility Bed Mobility Overal bed mobility: Needs Assistance Bed Mobility: Rolling, Supine to Sit, Sit to Supine Rolling: Mod assist, Max assist, Used rails   Supine to sit: Max assist, +2 for safety/equipment, Used rails, HOB elevated Sit to supine: Max assist, +2 for physical assistance, +2 for safety/equipment   General bed mobility comments: able to roll to L with min A of pad to place pillow for weight redistribution due to soreness in buttocks, pt able to provide assist by pulling on L rail, pt requiring maxAx2 for coming to EoB and return to bed, pt is able to move R LE into and out of bed.    Transfers  Balance Overall balance assessment: Needs assistance Sitting-balance support: Single extremity supported, Feet  supported (Right foot) Sitting balance-[name]: Poor Sitting balance - Comments: with exit on R side of the bed and R foot on floor and L residual limb in bed facing foot of bed, pt able to come into a sort of longsitting position where is his able to hold himself up with out posterior support with fatigue afterwhich pt is requiring min-mod posterior support; pt demonstrates mildly improved sitting balance this session as compared with last skilled OT session                                   ADL either performed or assessed with clinical judgement   ADL Overall ADL's : Needs assistance/impaired Eating/Feeding: Set up;Bed level               Upper Body Dressing : Minimal assistance;Moderate assistance;Cueing for compensatory techniques;Cueing for sequencing;Sitting                          Extremity/Trunk Assessment Upper Extremity Assessment Upper Extremity Assessment: LUE deficits/detail;Right hand dominant (R UE strength, ROM, and coordination WFL) LUE Deficits / Details: No active movement noted, no activation of scapula. no subluxation noted. Edematous throughout UE. pt with decreased hand extension of digits. Would likely benefit from a resting hand splint. LUE Sensation: decreased light touch;decreased proprioception LUE Coordination: decreased fine motor;decreased gross motor   Lower Extremity Assessment Lower Extremity Assessment: Defer to PT evaluation        Vision       Perception     Praxis      Cognition Arousal: Alert Behavior During Therapy: Flat affect Overall Cognitive Status: Impaired/Different from baseline Area of Impairment: Attention, Memory, Following commands, Safety/judgement, Awareness, Problem solving                   Current Attention Level: Selective Memory: Decreased short-term memory Following Commands: Follows one step commands consistently, Follows multi-step commands inconsistently, Follows one  step commands with increased time Safety/Judgement: Decreased awareness of safety, Decreased awareness of deficits Awareness: Emergent Problem Solving: Difficulty sequencing, Requires verbal cues, Slow processing, Decreased initiation General Comments: pt with constant conversation and requiring occasional redirection but is able somewhat successfully dual task with therapeutic exercise in sitting EoB. Pt demonstrating improved ability to sequence tasks this session as compared to last skilled OT session        Exercises Exercises: General Upper Extremity General Exercises - Upper Extremity Shoulder Flexion: AROM, Right, 10 reps, Seated (sitting EOB with CGA to Mod assist to maintain sitting balance; increased core strength) Shoulder Extension: AROM, Right, 10 reps, Seated (sitting EOB with CGA to Mod assist to maintain sitting balance; increased core strength) Shoulder Horizontal ABduction: AROM, Strengthening, Right, 10 reps, Seated (sitting EOB with CGA to Mod assist to maintain sitting balance; increased core strength) Shoulder Horizontal ADduction: AROM, Right, 10 reps, Seated (sitting EOB with CGA to Mod assist to maintain sitting balance; increased core strength) Elbow Flexion: AROM, Right, 10 reps, Seated (sitting EOB with CGA to Mod assist to maintain sitting balance; increased core strength) Elbow Extension: AROM, Right, 10 reps, Seated (sitting EOB with CGA to Mod assist to maintain sitting balance; increased core strength)    Shoulder Instructions       General Comments VSS on RA throughout session. Pt requested Social Worker discuss  his POC/discharge planning with his mother. OT sent a message to Child psychotherapist relaying pt request.    Pertinent Vitals/ Pain       Pain Assessment Pain Assessment: Faces Faces Pain Scale: Hurts a little bit Pain Location: buttocks Pain Descriptors / Indicators: Sore, Discomfort Pain Intervention(s): Limited activity within patient's tolerance,  Monitored during session, Repositioned  Home Living                                          Prior Functioning/Environment              Frequency  Min 1X/week        Progress Toward Goals  OT Goals(current goals can now be found in the care plan section)     Acute Rehab OT Goals Patient Stated Goal: to get stronger and return home  Plan      Co-evaluation    PT/OT/SLP Co-Evaluation/Treatment: Yes Reason for Co-Treatment: For patient/therapist safety;To address functional/ADL transfers;Complexity of the patient's impairments (multi-system involvement) PT goals addressed during session: Mobility/safety with mobility;Balance;Proper use of DME;Strengthening/ROM OT goals addressed during session: ADL's and self-care;Strengthening/ROM      AM-PAC OT "6 Clicks" Daily Activity     Outcome Measure   Help from another person eating meals?: A Little Help from another person taking care of personal grooming?: A Little Help from another person toileting, which includes using toliet, bedpan, or urinal?: Total Help from another person bathing (including washing, rinsing, drying)?: A Lot Help from another person to put on and taking off regular upper body clothing?: A Little Help from another person to put on and taking off regular lower body clothing?: A Lot 6 Click Score: 14    End of Session    OT Visit Diagnosis: Other abnormalities of gait and mobility (R26.89);Other symptoms and signs involving the nervous system (R29.898);Hemiplegia and hemiparesis;Muscle weakness (generalized) (M62.81);Other symptoms and signs involving cognitive function Hemiplegia - Right/Left: Left Hemiplegia - dominant/non-dominant: Non-Dominant Hemiplegia - caused by: Cerebral infarction   Activity Tolerance Patient tolerated treatment well   Patient Left in bed;with call bell/phone within reach;with bed alarm set   Nurse Communication Mobility status        Time:  3086-5784 OT Time Calculation (min): 33 min  Charges: OT General Charges $OT Visit: 1 Visit OT Treatments $Therapeutic Exercise: 8-22 mins  Jasmain Ahlberg "Darral Ellis., OTR/L, MA Acute Rehab [phone]   Walt Gunner 10/13/2023, 2:22 PM

## 2023-10-13 NOTE — Progress Notes (Signed)
 Physical Therapy Treatment Patient Details Name: [name] MRN: [id number] DOB: 15-Mar-1981 Today's Date: 10/13/2023   History of Present Illness 43 y.o. male presents to [facility] 09/09/23 as a transfer from [facility] for further management of endocarditis. Pt originally was admitted to [facility] 07/09/23 for DKA and sepsis after being found on the ground. 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 10/17 pt had a R MCA, L occipital, and B cerebellum CVA w/ septic emboli. ICU admit 10/18 w/ intubation 10/18-10/24. Pt was transferred to [facility] for MV repair on 11/29. At [facility], pt had cardiac cath w/ non obstructive disease, SAH, a craniotomy w/ abscess drainage on 12/4, and R third toe distal necrosis. PMH: chronic L AKA, MRSA, IDDM, ESRD on HD MWF, HTN, TEE positive for vegetation on multiple valves, cellulitis, medical noncompliance, narcotic dependence, DM with gastroparesis, L internal jugular DVT.    PT Comments  Pt agreeable to PT/OT session. Happy to have fresh gown to cover himself. Pt agreeable to get to EoB. Pt continues to need maxAx2 but his sequencing and sitting balance are improved. Bed pad/fitted sheet combination is partially responsible and will bring slicker bed pad in next session. Pt able to sit EoB with mod progressing to supervision for sitting balance. Also able to perform R UE and LE exercises in sitting. After ~12 min pt with increased fatigue requires maxAx2 for returning to supine. Pt with constant conversation throughout session, therapist acknowledge but continue to have pt participate in movement and exercise. During session pt asks OT to reach out to SW to allow pt's mother to discuss discharge planning and OT completed during session. D/c plan remains appropriate.    If plan is discharge home, recommend the following: Two people to help with walking and/or transfers;Assist for transportation;Supervision due to cognitive status;Help with stairs  or ramp for entrance;Two people to help with bathing/dressing/bathroom;Assistance with cooking/housework;Direct supervision/assist for financial management;Direct supervision/assist for medications management   Can travel by private vehicle     No  Equipment Recommendations  Hoyer lift;Wheelchair (measurements PT);Wheelchair cushion (measurements PT);Other (comment)       Precautions / Restrictions Precautions Precautions: Fall;Other (comment) Precaution Comments: Contact precs; L AKA, L hemiparesis and edema, L neglect, bowel incontinence. L femoral HD cath Restrictions Weight Bearing Restrictions Per Provider Order: No LLE Weight Bearing Per Provider Order: Non weight bearing Other Position/Activity Restrictions: Pt has sling for LUE when mobilizing OOB; LLE prosthetic not fitting     Mobility  Bed Mobility Overal bed mobility: Needs Assistance Bed Mobility: Rolling, Supine to Sit, Sit to Supine Rolling: Mod assist, Max assist, Used rails   Supine to sit: Max assist, +2 for safety/equipment, Used rails, HOB elevated Sit to supine: Max assist, +2 for physical assistance, +2 for safety/equipment   General bed mobility comments: able to roll to L with min A of pad to place pillow for weight redistribution due to soreness in buttocks, pt able to provide assist by pulling on L rail, pt requiring maxAx2 for coming to EoB and return to bed, pt is able to move R LE into and out of bed.                 Balance Overall balance assessment: Needs assistance Sitting-balance support: Single extremity supported, Feet supported (Rt foot) Sitting balance-[name]: Poor Sitting balance - Comments: with exit on R side of the bed and R foot on floor and L residual limb in bed facing foot of  bed, pt able to come into a sort of longsitting position where is his able to hold himself up with out posterior support with fatigue afterwhich pt is requiring min-mod posterior support                                     Cognition Arousal: Alert Behavior During Therapy: Flat affect Overall Cognitive Status: Impaired/Different from baseline                     Current Attention Level: Selective Memory: Decreased short-term memory Following Commands: Follows one step commands consistently, Follows multi-step commands inconsistently, Follows one step commands with increased time   Awareness: Emergent Problem Solving: Difficulty sequencing, Requires verbal cues, Slow processing, Decreased initiation General Comments: pt with constant conversation but is able somewhat successfully dual task with therapeutic exercise in sitting EoB,        Exercises General Exercises - Lower Extremity Long Arc Quad: AROM, Right, 10 reps, Seated Hip Flexion/Marching: AROM, Right, 10 reps, Seated (x2)    General Comments General comments (skin integrity, edema, etc.): VSS on RA,      Pertinent Vitals/Pain Pain Assessment Pain Assessment: Faces Faces Pain Scale: No hurt Pain Location: buttocks Pain Descriptors / Indicators: Sore, Discomfort Pain Intervention(s): Limited activity within patient's tolerance, Monitored during session, Repositioned     PT Goals (current goals can now be found in the care plan section) Acute Rehab PT Goals Patient Stated Goal: rehab at SNF PT Goal Formulation: With patient Time For Goal Achievement: 10/12/23 Potential to Achieve Goals: Fair Progress towards PT goals: Progressing toward goals    Frequency    Min 1X/week           Co-evaluation PT/OT/SLP Co-Evaluation/Treatment: Yes Reason for Co-Treatment: For patient/therapist safety;To address functional/ADL transfers;Complexity of the patient's impairments (multi-system involvement) PT goals addressed during session: Mobility/safety with mobility;Balance;Proper use of DME;Strengthening/ROM OT goals addressed during session: ADL's and self-care;Strengthening/ROM      AM-PAC  PT "6 Clicks" Mobility   Outcome Measure  Help needed turning from your back to your side while in a flat bed without using bedrails?: A Lot Help needed moving from lying on your back to sitting on the side of a flat bed without using bedrails?: Total Help needed moving to and from a bed to a chair (including a wheelchair)?: Total Help needed standing up from a chair using your arms (e.g., wheelchair or bedside chair)?: Total Help needed to walk in hospital room?: Total Help needed climbing 3-5 steps with a railing? : Total 6 Click Score: 7    End of Session   Activity Tolerance: Patient tolerated treatment well Patient left: in bed;with call bell/phone within reach;with bed alarm set Nurse Communication: Mobility status;Need for lift equipment PT Visit Diagnosis: Other abnormalities of gait and mobility (R26.89);Other symptoms and signs involving the nervous system (R29.898);Hemiplegia and hemiparesis Hemiplegia - Right/Left: Left Hemiplegia - dominant/non-dominant: Non-dominant Hemiplegia - caused by: Cerebral infarction     Time: 1914-7829 PT Time Calculation (min) (ACUTE ONLY): 33 min  Charges:    $Therapeutic Activity: 8-22 mins PT General Charges $$ ACUTE PT VISIT: 1 Visit                     Mairin Lindsley B. Jewel Mortimer PT, DPT Acute Rehabilitation Services Please use secure chat or  Call Office [phone]    Verlie Glisson  Fleet 10/13/2023, 12:02 PM

## 2023-10-13 NOTE — TOC Progression Note (Signed)
 Transition of Care [facility]) - Progression Note    Patient Details  Name: [name] MRN: [id number] Date of Birth: October 28, 1980  [facility] CM/SW Contact  Katrinka Parr, [name] Phone Number: 10/13/2023, 1:53 PM  Clinical Narrative:     CSW is informed by [facility] admissions that they cannot take pt due to concerns about his behavior. They are concerned about having needed security to be called recently and patient recording conversations as well as the hostility towards staff. [facility] Admissions would still consider patient but would need to "look at him for about a week" to see if his behaviors improve.    CSW met with pt to discuss barriers. Pt expresses frustration about still being in the hospital. He is apologetic about behaviors and hostility towards staff. He explains that if he were to go to the SNF that he would be on his best behavior after arriving. CSW informed pt that SNF is requiring pt to demonstrate improved behavior while at hospital prior to admitting him. CSW attempted to support pt as much as possible though pt becomes upset at CSW. He expresses distrust that CSW is the only person telling him this information. He repeatedly states he wants to leave. He refuses for CSW to contact his mother. CSW does discuss potential to explore a different facility which pt is agreeable stating "wherever they can take me, I want to go."     Expected Discharge Plan: Skilled Nursing Facility Barriers to Discharge: Continued Medical Work up                            Social Determinants of Health (SDOH) Interventions SDOH Screenings   Food Insecurity: No Food Insecurity (09/09/2023)  Housing: Unknown (09/09/2023)  Transportation Needs: No Transportation Needs (09/09/2023)  Utilities: Not At Risk (09/09/2023)  Alcohol Screen: Low Risk  (02/15/2023)  Depression (PHQ2-9): Low Risk  (02/15/2023)  Financial Resource Strain: Low Risk  (02/15/2023)  Physical Activity:  Inactive (02/15/2023)  Social Connections: Socially Integrated (02/15/2023)  Stress: No Stress Concern Present (02/15/2023)  Tobacco Use: Low Risk  (09/12/2023)  Recent Concern: Tobacco Use - Medium Risk (09/01/2023)   Received from [facility]    Readmission Risk Interventions     No data to display

## 2023-10-13 NOTE — Progress Notes (Signed)
 PROGRESS NOTE    MUSIQ VEST  LKG:[id number] DOB: [birth year] DOA: 09/09/2023 PCP: Marius Siemens, NP   Brief Narrative:  This 43 yrs old male with PMH significant for ESRD on HD, DM 1, gastroparesis, Neuropathy, diabetic foot s/p prior left AKA, recent MRSA endocarditis, stroke 10/11, patient was hospitalized for DKA.  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   11/29, patient was transferred to [facility] for MV repair.  While at [facility] he underwent cardiac cath showed non obstructive disease, craniotomy with abscess drainage on 09/01/23.  Course over there was complicated by subarachnoid hemorrhage while on IV heparin  as well as right third toe distal necrosis.  He was seen by podiatry, but family wanted conservative management. Since his tertiary care needs have been completed, he was transferred back to [facility] for further management on 12/12. PTOT  recommending SNF and awaiting placement.   Assessment & Plan:   Principal Problem:   Endocarditis Active Problems:   MRSA bacteremia   Endocarditis of tricuspid valve   ESRD on dialysis ([name])   Cognitive impairment   Insulin  dependent type 1 diabetes mellitus ([name])   Diabetic gastroparesis ([name])   Diabetic foot infection ([name])   Endocarditis of mitral valve   Cerebrovascular accident (CVA) due to embolism of cerebral artery ([name])   Hypotension : ESRD on HD Currently on midodrine  MWF. Metoprolol  on hold. Post discharge, Continue dialysis schedule per nephrology Caseworker and renal navigator working on outpatient chair arrangement.   MRSA TV/MV endocarditis: S/p MV repair at [facility] -11/29 10/12, blood cultures positive for MRSA. 10/13, TTE showed LVEF of 25-30% with concern for AV/MV abnormality . 10/16 and 10/22, TEE showed MV/TV vegetations.  11/29, s/p MV  repair at [facility] . Currently on IV vancomycin .  Per ID recommendation, to complete 8 weeks of treatment ( EOT 1/29) Outpatient ID follow-up with appointment scheduled for 10/12/2023. May need that rescheduled if patient is still remains in the hospital. While at [facility], patient underwent a left heart catheterization which was significant for nonobstructive disease.   Acute CVA: Secondary to septic emboli.   10/17, MRI showed large right MCA branch infarct.  11/25, MRA head showed significant for large necrotic appearing lesion within the right frontal lobe within area of the prior right MCA territory infarct with vasogenic edema and 8 mm leftward midline shift.  Stroke workup completed.   Neurology recommendations for Eliquis .  PT/OT recommendations for SNF.   Cerebral emboli with abscess / Cerebritis: Secondary to MRSA endocarditis.  12/4, while at [facility], patient was evaluated by neurosurgery who performed a craniotomy for abscess drainage. Follow-up as an outpatient.   Prostate fluid collection/abscess: Seen by urology at [facility].  Per their assessment, fluid collection was secondary to dilated seminal vesicle with no surgical intervention recommended.  Infectious disease with continued concern for possible abscess. Patient covered with vancomycin  with recommendation for outpatient urology follow-up.   Type 1 diabetes mellitus: HbA1c 8.9 on 07/10/2023 Currently on Semglee  6 units daily and SSI with Accu-Checks. Blood sugar level mostly under 200.  Anemia of chronic kidney disease Hemoglobin stable.  Acute left upper extremity DVT: Continue Eliquis .   Right third toe necrosis: H/o prior left BKA: Per documentation, patient/family opted for conservative management and declined surgical management. Patient was seen by PT/OT with recommendation for SNF on discharge.   Mild  cognitive dysfunction: Behavioral changes Likely secondary to brain abscess, craniotomy Currently  stable on Seroquel .   Pressure injury Left buttock. Not present on admission.   Goals of care   Code Status: Full Code    DVT prophylaxis: Eliquis  Code Status: Full code Family Communication: No Family at bedside Disposition Plan:   Status is: Inpatient Remains inpatient appropriate because: SNF not taking because of behavioral issues.     Consultants:  Neurology  Procedures: None  Antimicrobials:  Anti-infectives (From admission, onward)    Start     Dose/Rate Route Frequency Ordered Stop   10/14/23 1200  vancomycin  (VANCOREADY) IVPB 500 mg/100 mL        500 mg 100 mL/hr over 60 Minutes Intravenous Every T-Th-Sa (Hemodialysis) 10/13/23 1309 10/30/23 1159   10/13/23 1200  vancomycin  (VANCOREADY) IVPB 500 mg/100 mL  Status:  Discontinued        500 mg 100 mL/hr over 60 Minutes Intravenous Every M-W-F (Hemodialysis) 10/11/23 1517 10/13/23 1309   10/12/23 1200  vancomycin  (VANCOREADY) IVPB 500 mg/100 mL  Status:  Discontinued        500 mg 100 mL/hr over 60 Minutes Intravenous Every T-Th-Sa (Hemodialysis) 10/11/23 1514 10/11/23 1517   09/24/23 1200  vancomycin  (VANCOREADY) IVPB 500 mg/100 mL  Status:  Discontinued        500 mg 100 mL/hr over 60 Minutes Intravenous Every M-W-F (Hemodialysis) 09/21/23 1246 10/11/23 1514   09/20/23 1200  vancomycin  (VANCOCIN ) 750 mg in sodium chloride  0.9 % 250 mL IVPB  Status:  Discontinued        750 mg 265 mL/hr over 60 Minutes Intravenous Every M-W-F (Hemodialysis) 09/20/23 0446 09/21/23 1246   09/20/23 1045  vancomycin  (VANCOCIN ) 750 mg in sodium chloride  0.9 % 250 mL IVPB        750 mg 265 mL/hr over 60 Minutes Intravenous  Once 09/20/23 0959 09/20/23 1226   09/20/23 0945  vancomycin  (VANCOREADY) IVPB 750 mg/150 mL  Status:  Discontinued        750 mg 150 mL/hr over 60 Minutes Intravenous  Once 09/20/23 0855 09/20/23 0959   09/10/23 1200  vancomycin  (VANCOREADY) IVPB 750 mg/150 mL  Status:  Discontinued        750 mg 150 mL/hr  over 60 Minutes Intravenous Every M-W-F (Hemodialysis) 09/09/23 2030 09/20/23 0446   09/09/23 2200  linezolid  (ZYVOX ) tablet 600 mg  Status:  Discontinued        600 mg Oral Every 12 hours 09/09/23 2035 09/10/23 0946       Subjective: Seen and examined at bedside.  Overnight events noted.  Patient reports doing much better. Patient is awaiting  skilled nursing facility but he is frustrated that there has not accept him because of his behavior  Objective: Vitals:   10/12/23 2230 10/13/23 0609 10/13/23 0756 10/13/23 1234  BP: (!) 100/44 (!) 123/93 125/86 (!) 143/95  Pulse: 96 (!) 110 (!) 107 (!) 109  Resp: 18 18 16 20   Temp: 98.3 F (36.8 C) 98.3 F (36.8 C) 97.8 F (36.6 C) (!) 97.2 F (36.2 C)  TempSrc:   Oral Oral  SpO2: 98% 97% 97% 96%  Weight:      Height:        Intake/Output Summary (Last 24 hours) at 10/13/2023 1444 Last data filed at 10/13/2023 0814 Gross per 24 hour  Intake 1080 ml  Output 0 ml  Net 1080 ml   Filed Weights   10/11/23 0820 10/11/23 1244 10/12/23 0618  Weight:  96.2 kg 93.7 kg 77.7 kg    Examination:  General exam: Appears calm and comfortable, deconditioned, not in any acute distress Respiratory system: Clear to auscultation. Respiratory effort normal.  RR 16 Cardiovascular system: S1 & S2 heard, RRR. No JVD, murmurs, rubs, gallops or clicks.  Gastrointestinal system: Abdomen is non distended, soft and non tender.  Normal bowel sounds heard. Central nervous system: Alert and oriented x 3. No focal neurological deficits. Extremities: No edema, left BKA.  Right toe infection noted Skin: No rashes, lesions or ulcers Psychiatry: Judgement and insight appear normal. Mood & affect appropriate.     Data Reviewed: I have personally reviewed following labs and imaging studies  CBC: Recent Labs  Lab 10/08/23 0920  WBC 6.2  HGB 7.9*  HCT 27.0*  MCV 95.1  PLT 236   Basic Metabolic Panel: Recent Labs  Lab 10/08/23 0920 10/10/23 1106  NA 136  136  K 5.4* 6.2*  CL 95* 96*  CO2 26 23  GLUCOSE 189* 188*  BUN 45* 46*  CREATININE 6.02* 5.88*  CALCIUM  9.0 9.3  PHOS 9.2*  --    GFR: Estimated Creatinine Clearance: 15 mL/min (A) (by C-G formula based on SCr of 5.88 mg/dL (H)). Liver Function Tests: Recent Labs  Lab 10/08/23 0920  ALBUMIN  3.1*   No results for input(s): "LIPASE", "AMYLASE" in the last 168 hours. No results for input(s): "AMMONIA" in the last 168 hours. Coagulation Profile: No results for input(s): "INR", "PROTIME" in the last 168 hours. Cardiac Enzymes: No results for input(s): "CKTOTAL", "CKMB", "CKMBINDEX", "TROPONINI" in the last 168 hours. BNP (last 3 results) No results for input(s): "PROBNP" in the last 8760 hours. HbA1C: No results for input(s): "HGBA1C" in the last 72 hours. CBG: Recent Labs  Lab 10/12/23 1157 10/12/23 1545 10/12/23 2231 10/13/23 0812 10/13/23 1231  GLUCAP 153* 83 119* 155* 207*   Lipid Profile: No results for input(s): "CHOL", "HDL", "LDLCALC", "TRIG", "CHOLHDL", "LDLDIRECT" in the last 72 hours. Thyroid Function Tests: No results for input(s): "TSH", "T4TOTAL", "FREET4", "T3FREE", "THYROIDAB" in the last 72 hours. Anemia Panel: No results for input(s): "VITAMINB12", "FOLATE", "FERRITIN", "TIBC", "IRON ", "RETICCTPCT" in the last 72 hours. Sepsis Labs: No results for input(s): "PROCALCITON", "LATICACIDVEN" in the last 168 hours.  No results found for this or any previous visit (from the past 240 hours).   Radiology Studies: No results found.  Scheduled Meds:  (feeding supplement) PROSource Plus  30 mL Oral BID BM   apixaban   5 mg Oral BID   bacitracin    Topical Daily   Chlorhexidine  Gluconate Cloth  6 each Topical Q0600   darbepoetin (ARANESP ) injection - DIALYSIS  100 mcg Subcutaneous Q Sun-1800   dextrose   1 Tube Oral Once   dextrose   25 mL Intravenous Once   gabapentin   100 mg Oral TID   Gerhardt's butt cream   Topical BID   influenza vac split trivalent PF   0.5 mL Intramuscular Tomorrow-1000   insulin  aspart  0-6 Units Subcutaneous TID WC   insulin  glargine-yfgn  6 Units Subcutaneous Daily   loratadine   10 mg Oral Daily   midodrine   10 mg Oral Q M,W,F-HD   pantoprazole   40 mg Oral Daily   povidone-Iodine    Topical Q0600   QUEtiapine   12.5 mg Oral q morning   QUEtiapine   25 mg Oral QHS   senna-docusate  2 tablet Oral BID   sevelamer  carbonate  2,400 mg Oral TID WC   sodium zirconium cyclosilicate   10  g Oral TID   Continuous Infusions:  albumin  human 60 mL/hr at 10/12/23 0600   [START ON 10/14/2023] vancomycin        LOS: 34 days    Time spent: 50 mins    Magdalene School, MD [facility]   If 7PM-7AM, please contact night-coverage

## 2023-10-13 NOTE — Progress Notes (Signed)
 [facility] Progress Note   Subjective:    Patient seen and examined at bedside.   Calm and conversant this am. Plans to be on his "best behavior" going forward  SNF DC in process Reminded pt he will be THS at [facility] upon discharge  Objective Vitals:   10/12/23 1547 10/12/23 2230 10/13/23 0609 10/13/23 0756  BP: (!) 131/40 (!) 100/44 (!) 123/93 125/86  Pulse:  96 (!) 110 (!) 107  Resp: 18 18 18 16   Temp: 98.2 F (36.8 C) 98.3 F (36.8 C) 98.3 F (36.8 C) 97.8 F (36.6 C)  TempSrc: Oral   Oral  SpO2: 100% 98% 97% 97%  Weight:      Height:       Physical Exam General:chronically ill appearing male in NAD Heart: normal rate, regular, 2/6 murmur Lungs:mostly CTAB, breath sounds decreased Abdomen:soft, +tight abdominal edema Extremities:no LE edema Dialysis Access: L fem [facility]   Filed Weights   10/11/23 0820 10/11/23 1244 10/12/23 0618  Weight: 96.2 kg 93.7 kg 77.7 kg    Intake/Output Summary (Last 24 hours) at 10/13/2023 1206 Last data filed at 10/13/2023 0814 Gross per 24 hour  Intake 1080 ml  Output 0 ml  Net 1080 ml    Additional Objective Labs: Basic Metabolic Panel: Recent Labs  Lab 10/08/23 0920 10/10/23 1106  NA 136 136  K 5.4* 6.2*  CL 95* 96*  CO2 26 23  GLUCOSE 189* 188*  BUN 45* 46*  CREATININE 6.02* 5.88*  CALCIUM  9.0 9.3  PHOS 9.2*  --    Liver Function Tests: Recent Labs  Lab 10/08/23 0920  ALBUMIN  3.1*    CBC: Recent Labs  Lab 10/08/23 0920  WBC 6.2  HGB 7.9*  HCT 27.0*  MCV 95.1  PLT 236   Blood Culture    Component Value Date/Time   SDES BLOOD SITE NOT SPECIFIED 08/25/2023 1706   SPECREQUEST  08/25/2023 1706    BOTTLES DRAWN AEROBIC ONLY Blood Culture results may not be optimal due to an inadequate volume of blood received in culture bottles   CULT  08/25/2023 1706    NO GROWTH 5 DAYS Performed at [facility], [address], [place], [name] [address]    REPTSTATUS 08/30/2023 FINAL 08/25/2023  1706    Studies/Results: No results found.   Medications:  albumin  human 60 mL/hr at 10/12/23 0600   vancomycin       (feeding supplement) PROSource Plus  30 mL Oral BID BM   apixaban   5 mg Oral BID   bacitracin    Topical Daily   Chlorhexidine  Gluconate Cloth  6 each Topical Q0600   darbepoetin (ARANESP ) injection - DIALYSIS  100 mcg Subcutaneous Q Sun-1800   dextrose   1 Tube Oral Once   dextrose   25 mL Intravenous Once   gabapentin   100 mg Oral TID   Gerhardt's butt cream   Topical BID   influenza vac split trivalent PF  0.5 mL Intramuscular Tomorrow-1000   insulin  aspart  0-6 Units Subcutaneous TID WC   insulin  glargine-yfgn  6 Units Subcutaneous Daily   loratadine   10 mg Oral Daily   midodrine   10 mg Oral Q M,W,F-HD   pantoprazole   40 mg Oral Daily   povidone-Iodine    Topical Q0600   QUEtiapine   12.5 mg Oral q morning   QUEtiapine   25 mg Oral QHS   senna-docusate  2 tablet Oral BID   sevelamer  carbonate  2,400 mg Oral TID WC   sodium  zirconium cyclosilicate  10 g Oral TID    Dialysis Orders: MWF - [facility] 4hr, 400/A1.5, EDW 77kg, L femoral TDC, 2K/3Ca bath, no heparin    Assessment/Plan: 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 Continues on extended Vanc course - will be on Vanc 750mcg IV q HD until 10/27/23. MRSA brain abscess: S/p transfer to [facility] and drainage 08/31/24 Acute B CVA/septic emboli Acute LUE DVT: On Eliquis  ESRD: Usual MWF schedule, Transitioning to new TTS schedule this week.  HD 1/13 Mon 2/2 hyperkalemia. Plan for next HD Thurs.  Dialysis access: Has L femoral TDC, Hx B subclavian occlusions. HTN/volume: BP stable. Lots of LUE edema + flank edema, CT with diffuse anasarca. Well above prior dry weight. Max UF as tolerated with HD.   Anemia of ESRD: Hgb 7.9- Increase Aranesp  150mcg q Sunday while here. No Iv iron  while on  abx. Secondary HPTH: CorrCa high, Phos high. Hold calcitriol , continue sevelamer  as binder but ^ dose to 3/meals. Hyperkalemia: Despite Lokelma  10g TID (refusing at times), remains an issue.   Nutrition: Alb low, adding protein supplements. T1D: Per primary. Dispo: SNF being arranged. Since has been hospitalized for so long, his outpatient clinic temporarily fillled his prior chair - Plan to go to [place] T[facility] temporarily until he can get back to [facility].   Elona Hal, PA-C  [facility] 10/13/2023,12:06 PM  LOS: 34 days

## 2023-10-14 DIAGNOSIS — B9562 Methicillin resistant Staphylococcus aureus infection as the cause of diseases classified elsewhere: Secondary | ICD-10-CM | POA: Diagnosis not present

## 2023-10-14 DIAGNOSIS — I38 Endocarditis, valve unspecified: Secondary | ICD-10-CM | POA: Diagnosis not present

## 2023-10-14 DIAGNOSIS — I120 Hypertensive chronic kidney disease with stage 5 chronic kidney disease or end stage renal disease: Secondary | ICD-10-CM | POA: Diagnosis not present

## 2023-10-14 DIAGNOSIS — R7881 Bacteremia: Secondary | ICD-10-CM | POA: Diagnosis not present

## 2023-10-14 DIAGNOSIS — N250 Renal osteodystrophy: Secondary | ICD-10-CM | POA: Diagnosis not present

## 2023-10-14 DIAGNOSIS — N186 End stage renal disease: Secondary | ICD-10-CM | POA: Diagnosis not present

## 2023-10-14 DIAGNOSIS — D631 Anemia in chronic kidney disease: Secondary | ICD-10-CM | POA: Diagnosis not present

## 2023-10-14 DIAGNOSIS — I330 Acute and subacute infective endocarditis: Secondary | ICD-10-CM | POA: Diagnosis not present

## 2023-10-14 DIAGNOSIS — Z992 Dependence on renal dialysis: Secondary | ICD-10-CM | POA: Diagnosis not present

## 2023-10-14 LAB — BASIC METABOLIC PANEL
Anion gap: 16 — ABNORMAL HIGH (ref 5–15)
BUN: 62 mg/dL — ABNORMAL HIGH (ref 6–20)
CO2: 26 mmol/L (ref 22–32)
Calcium: 9.1 mg/dL (ref 8.9–10.3)
Chloride: 94 mmol/L — ABNORMAL LOW (ref 98–111)
Creatinine, Ser: 7.73 mg/dL — ABNORMAL HIGH (ref 0.61–1.24)
GFR, Estimated: 8 mL/min — ABNORMAL LOW (ref >60)
Glucose, Bld: 289 mg/dL — ABNORMAL HIGH (ref 70–99)
Potassium: 5.9 mmol/L — ABNORMAL HIGH (ref 3.5–5.1)
Sodium: 136 mmol/L (ref 135–145)

## 2023-10-14 LAB — CBC
HCT: 29.6 % — ABNORMAL LOW (ref 39.0–52.0)
Hemoglobin: 8.5 g/dL — ABNORMAL LOW (ref 13.0–17.0)
MCH: 27.7 pg (ref 26.0–34.0)
MCHC: 28.7 g/dL — ABNORMAL LOW (ref 30.0–36.0)
MCV: 96.4 fL (ref 80.0–100.0)
Platelets: 156 10*3/uL (ref 150–400)
RBC: 3.07 MIL/uL — ABNORMAL LOW (ref 4.22–5.81)
RDW: 15.6 % — ABNORMAL HIGH (ref 11.5–15.5)
WBC: 5.7 10*3/uL (ref 4.0–10.5)
nRBC: 0 % (ref 0.0–0.2)

## 2023-10-14 LAB — GLUCOSE, CAPILLARY
Glucose-Capillary: 403 mg/dL — ABNORMAL HIGH (ref 70–99)
Glucose-Capillary: 359 mg/dL — ABNORMAL HIGH (ref 70–99)
Glucose-Capillary: 128 mg/dL — ABNORMAL HIGH (ref 70–99)
Glucose-Capillary: 120 mg/dL — ABNORMAL HIGH (ref 70–99)

## 2023-10-14 LAB — VANCOMYCIN, RANDOM: Vancomycin Rm: 12 ug/mL

## 2023-10-14 LAB — PHOSPHORUS: Phosphorus: 9.8 mg/dL — ABNORMAL HIGH (ref 2.5–4.6)

## 2023-10-14 LAB — MAGNESIUM: Magnesium: 2.5 mg/dL — ABNORMAL HIGH (ref 1.7–2.4)

## 2023-10-14 MED ORDER — VANCOMYCIN HCL 500 MG/100ML IV SOLN
500.0000 mg | Freq: Once | INTRAVENOUS | Status: AC
  Administered 2023-10-14: 500 mg via INTRAVENOUS
  Filled 2023-10-14: qty 100

## 2023-10-14 MED ORDER — HEPARIN SODIUM (PORCINE) 1000 UNIT/ML DIALYSIS
1000.0000 [IU] | INTRAMUSCULAR | Status: DC | PRN
  Administered 2023-10-14: 4600 [IU]
  Filled 2023-10-14: qty 1

## 2023-10-14 MED ORDER — VANCOMYCIN HCL 750 MG/150ML IV SOLN
750.0000 mg | INTRAVENOUS | Status: DC
  Filled 2023-10-14 (×2): qty 150

## 2023-10-14 NOTE — Progress Notes (Signed)
PHARMACY CONSULT NOTE FOR:  OUTPATIENT  PARENTERAL ANTIBIOTIC THERAPY (OPAT)  Informational as the patient will receive antibiotics with hemodialysis outpatient.   Indication: Disseminated MRSA infection  Regimen: Vancomycin 750 mg/HD-every Tuesday, Thursday and Saturday.  End date: 10/28/23    Thank you for allowing pharmacy to be a part of this patient's care.  Noah Delaine, RPh Clinical Pharmacist 10/14/2023 8:37 PM   **Pharmacist phone directory can now be found on [email or web address] ([email or web address]).  Listed under [facility].

## 2023-10-14 NOTE — Progress Notes (Signed)
   10/14/23 1806  Vitals  Temp 98.2 F (36.8 C)  Pulse Rate (!) 119  Resp 14  BP (!) 117/50  SpO2 97 %  O2 Device Room Air  Post Treatment  Dialyzer Clearance Lightly streaked  Liters Processed 63  Fluid Removed (mL) 2700 mL   Received patient in bed to unit.  Alert and oriented.  Informed consent signed and in chart. Patient tachycardia pre and post HD, patient denies any chest pain, sob, or any other discomffort. Primary RN made aware.  TX duration:3.0 hours  Patient tolerated well.  Transported back to the room  Alert, without acute distress.  Hand-off given to patient's nurse.   Access used: L Fem CVC Access issues: None  Total UF removed: Medication(s) given: See MAR  Laqueta Due, RN [facility]

## 2023-10-14 NOTE — Plan of Care (Signed)
  Problem: Education: Goal: Knowledge of General Education information will improve Description: Including pain rating scale, medication(s)/side effects and non-pharmacologic comfort measures Outcome: Progressing   Problem: Health Behavior/Discharge Planning: Goal: Ability to manage health-related needs will improve Outcome: Progressing   Problem: Clinical Measurements: Goal: Ability to maintain clinical measurements within normal limits will improve Outcome: Progressing Goal: Will remain free from infection Outcome: Progressing Goal: Diagnostic test results will improve Outcome: Progressing Goal: Cardiovascular complication will be avoided Outcome: Progressing   Problem: Activity: Goal: Risk for activity intolerance will decrease Outcome: Progressing   Problem: Coping: Goal: Level of anxiety will decrease Outcome: Progressing   Problem: Skin Integrity: Goal: Risk for impaired skin integrity will decrease Outcome: Progressing   Problem: Education: Goal: Ability to describe self-care measures that may prevent or decrease complications (Diabetes Survival Skills Education) will improve Outcome: Progressing Goal: Individualized Educational Video(s) Outcome: Progressing   Problem: Coping: Goal: Ability to adjust to condition or change in health will improve Outcome: Progressing   Problem: Fluid Volume: Goal: Ability to maintain a balanced intake and output will improve Outcome: Progressing   Problem: Health Behavior/Discharge Planning: Goal: Ability to identify and utilize available resources and services will improve Outcome: Progressing Goal: Ability to manage health-related needs will improve Outcome: Progressing   Problem: Metabolic: Goal: Ability to maintain appropriate glucose levels will improve Outcome: Progressing   Problem: Skin Integrity: Goal: Risk for impaired skin integrity will decrease Outcome: Progressing   Problem: Tissue Perfusion: Goal:  Adequacy of tissue perfusion will improve Outcome: Progressing   Problem: Education: Goal: Knowledge of disease and its progression will improve Outcome: Progressing Goal: Individualized Educational Video(s) Outcome: Progressing   Problem: Fluid Volume: Goal: Compliance with measures to maintain balanced fluid volume will improve Outcome: Progressing   Problem: Health Behavior/Discharge Planning: Goal: Ability to manage health-related needs will improve Outcome: Progressing   Problem: Nutritional: Goal: Ability to make healthy dietary choices will improve Outcome: Progressing   Problem: Clinical Measurements: Goal: Complications related to the disease process, condition or treatment will be avoided or minimized Outcome: Progressing

## 2023-10-14 NOTE — Progress Notes (Signed)
[facility] Progress Note   Subjective:    No new events Conversant this am.  SNF DC in process Following THS schedule. HD today   Objective Vitals:   10/13/23 1545 10/13/23 2021 10/13/23 2235 10/14/23 0533  BP: (!) 141/88 (!) 152/100 (!) 135/112 112/73  Pulse:  (!) 113 (!) 113 (!) 113  Resp: 20 18 18 18   Temp: 97.9 F (36.6 C) 98.4 F (36.9 C) 98.3 F (36.8 C) 98.3 F (36.8 C)  TempSrc: Axillary     SpO2: 100% 99% 97% 90%  Weight:      Height:       Physical Exam General:chronically ill appearing male in NAD Heart: normal rate, regular, 2/6 murmur Lungs:mostly CTAB, breath sounds decreased Abdomen:soft, +tight abdominal edema Extremities:no LE edema Dialysis Access: L fem [facility]   Filed Weights   10/11/23 0820 10/11/23 1244 10/12/23 0618  Weight: 96.2 kg 93.7 kg 77.7 kg    Intake/Output Summary (Last 24 hours) at 10/14/2023 1051 Last data filed at 10/14/2023 0643 Gross per 24 hour  Intake --  Output 0 ml  Net 0 ml    Additional Objective Labs: Basic Metabolic Panel: Recent Labs  Lab 10/08/23 0920 10/10/23 1106  NA 136 136  K 5.4* 6.2*  CL 95* 96*  CO2 26 23  GLUCOSE 189* 188*  BUN 45* 46*  CREATININE 6.02* 5.88*  CALCIUM 9.0 9.3  PHOS 9.2*  --    Liver Function Tests: Recent Labs  Lab 10/08/23 0920  ALBUMIN 3.1*    CBC: Recent Labs  Lab 10/08/23 0920  WBC 6.2  HGB 7.9*  HCT 27.0*  MCV 95.1  PLT 236   Blood Culture    Component Value Date/Time   SDES BLOOD SITE NOT SPECIFIED 08/25/2023 1706   SPECREQUEST  08/25/2023 1706    BOTTLES DRAWN AEROBIC ONLY Blood Culture results may not be optimal due to an inadequate volume of blood received in culture bottles   CULT  08/25/2023 1706    NO GROWTH 5 DAYS Performed at [facility], [address], [place], [name] [address]    REPTSTATUS 08/30/2023 FINAL 08/25/2023 1706    Studies/Results: No results found.   Medications:  albumin human 60 mL/hr at 10/12/23  0600   vancomycin      (feeding supplement) PROSource Plus  30 mL Oral BID BM   apixaban  5 mg Oral BID   bacitracin   Topical Daily   Chlorhexidine Gluconate Cloth  6 each Topical Q0600   darbepoetin (ARANESP) injection - DIALYSIS  100 mcg Subcutaneous Q Sun-1800   dextrose  1 Tube Oral Once   dextrose  25 mL Intravenous Once   gabapentin  100 mg Oral TID   Gerhardt's butt cream   Topical BID   influenza vac split trivalent PF  0.5 mL Intramuscular Tomorrow-1000   insulin aspart  0-6 Units Subcutaneous TID WC   insulin glargine-yfgn  6 Units Subcutaneous Daily   loratadine  10 mg Oral Daily   midodrine  10 mg Oral Q M,W,F-HD   pantoprazole  40 mg Oral Daily   povidone-Iodine   Topical Q0600   QUEtiapine  12.5 mg Oral q morning   QUEtiapine  25 mg Oral QHS   senna-docusate  2 tablet Oral BID   sevelamer carbonate  2,400 mg Oral TID WC   sodium zirconium cyclosilicate  10 g Oral TID    Dialysis Orders: MWF - [facility] 4hr, 400/A1.5, EDW 77kg, L femoral  TDC, 2K/3Ca bath, no heparin   Assessment/Plan: 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 Continues on extended Vanc course - will be on Vanc IV q HD until 10/27/23. MRSA brain abscess: S/p transfer to [facility] and drainage 08/31/24 Acute B CVA/septic emboli Acute LUE DVT: On Eliquis ESRD: Usual MWF schedule, Transitioning to new TTS schedule this week.  HD 1/13 Mon 2/2 hyperkalemia. Plan for next HD Thurs.  Dialysis access: Has L femoral TDC, Hx B subclavian occlusions. HTN/volume: BP stable. Lots of LUE edema + flank edema, CT with diffuse anasarca. Well above prior dry weight. Max UF as tolerated with HD.   Anemia of ESRD: Hgb 7.9- Increase Aranesp q Sunday while here. No Iv iron while on abx. Secondary HPTH: CorrCa high, Phos high. Hold calcitriol, continue sevelamer as binder but ^ dose to  3/meals. Hyperkalemia: Despite Lokelma 10g TID (refusing at times), remains an issue.   Nutrition: Alb low, adding protein supplements. T1D: Per primary. Dispo: SNF being arranged. Since has been hospitalized for so long, his outpatient clinic temporarily fillled his prior chair - Plan to go to [place] [facility] temporarily until he can get back to [facility].   Tomasa Blase, PA-C  [facility] 10/14/2023,10:51 AM  LOS: 35 days

## 2023-10-14 NOTE — Progress Notes (Signed)
PROGRESS NOTE    [name]  ZOX:[id number] DOB: 1981-05-12 DOA: 09/09/2023 PCP: [name], NP   Brief Narrative:  This 43 yrs old male with PMH significant for ESRD on HD, DM 1, gastroparesis, Neuropathy, diabetic foot s/p prior left AKA, recent MRSA endocarditis, stroke 10/11, patient was hospitalized for DKA.  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   11/29, patient was transferred to [facility] for MV repair.  While at [facility] he underwent cardiac cath showed non obstructive disease, craniotomy with abscess drainage on 09/01/23.  Course over there was complicated by subarachnoid hemorrhage while on IV heparin as well as right third toe distal necrosis.  He was seen by podiatry, but family wanted conservative management. Since his tertiary care needs have been completed, he was transferred back to [facility] for further management on 12/12. PTOT  recommending SNF and awaiting placement.   Assessment & Plan:   Principal Problem:   Endocarditis Active Problems:   MRSA bacteremia   Endocarditis of tricuspid valve   ESRD on dialysis ([name])   Cognitive impairment   Insulin dependent type 1 diabetes mellitus ([name])   Diabetic gastroparesis ([name])   Diabetic foot infection ([name])   Endocarditis of mitral valve   Cerebrovascular accident (CVA) due to embolism of cerebral artery ([name])   Hypotension : ESRD on HD Currently on midodrine MWF. Metoprolol on hold. Post discharge, Continue dialysis schedule per nephrology Caseworker and renal navigator working on outpatient chair arrangement.   MRSA TV/MV endocarditis: S/p MV repair at [facility] -11/29 10/12, blood cultures positive for MRSA. 10/13, TTE showed LVEF of 25-30% with concern for AV/MV abnormality . 10/16 and 10/22, TEE showed MV/TV vegetations.  11/29, s/p MV  repair at [facility] . Currently on IV vancomycin.  Per ID recommendation, to complete 8 weeks of treatment ( EOT 1/29) Outpatient ID follow-up with appointment scheduled for 10/12/2023. May need that rescheduled if patient is still remains in the hospital. While at [facility], patient underwent a left heart catheterization which was significant for nonobstructive disease.   Acute CVA: Secondary to septic emboli.   10/17, MRI showed large right MCA branch infarct.  11/25, MRA head showed significant for large necrotic appearing lesion within the right frontal lobe within area of the prior right MCA territory infarct with vasogenic edema and 8 mm leftward midline shift.  Stroke workup completed.   Neurology recommendations for Eliquis.  PT/OT recommendations for SNF.   Cerebral emboli with abscess / Cerebritis: Secondary to MRSA endocarditis.  12/4, while at [facility], patient was evaluated by neurosurgery who performed a craniotomy for abscess drainage. Follow-up as an outpatient.   Prostate fluid collection/abscess: Seen by urology at [facility].  Per their assessment, fluid collection was secondary to dilated seminal vesicle with no surgical intervention recommended.  Infectious disease with continued concern for possible abscess. Patient covered with vancomycin with recommendation for outpatient urology follow-up.   Type 1 diabetes mellitus: HbA1c 8.9 on 07/10/2023 Currently on Semglee 6 units daily and SSI with Accu-Checks. Blood sugar level mostly under 200.  Anemia of chronic kidney disease Hemoglobin stable.  Acute left upper extremity DVT: Continue Eliquis.   Right third toe necrosis: H/o prior left BKA: Per documentation, patient/family opted for conservative management and declined surgical management. Patient was seen by PT/OT with recommendation for SNF on discharge.   Mild  cognitive dysfunction: Behavioral changes Likely secondary to brain abscess, craniotomy Currently  stable on Seroquel.   Pressure injury Left buttock. Not present on admission.   Goals of care   Code Status: Full Code    DVT prophylaxis: Eliquis Code Status: Full code Family Communication: No Family at bedside Disposition Plan:   Status is: Inpatient Remains inpatient appropriate because: SNF not taking because of behavioral issues.     Consultants:  Neurology  Procedures: None  Antimicrobials:  Anti-infectives (From admission, onward)    Start     Dose/Rate Route Frequency Ordered Stop   10/14/23 1200  vancomycin (VANCOREADY) IVPB 500 mg/100 mL        500 mg 100 mL/hr over 60 Minutes Intravenous Every T-Th-Sa (Hemodialysis) 10/13/23 1309 10/30/23 1159   10/13/23 1200  vancomycin (VANCOREADY) IVPB 500 mg/100 mL  Status:  Discontinued        500 mg 100 mL/hr over 60 Minutes Intravenous Every M-W-F (Hemodialysis) 10/11/23 1517 10/13/23 1309   10/12/23 1200  vancomycin (VANCOREADY) IVPB 500 mg/100 mL  Status:  Discontinued        500 mg 100 mL/hr over 60 Minutes Intravenous Every T-Th-Sa (Hemodialysis) 10/11/23 1514 10/11/23 1517   09/24/23 1200  vancomycin (VANCOREADY) IVPB 500 mg/100 mL  Status:  Discontinued        500 mg 100 mL/hr over 60 Minutes Intravenous Every M-W-F (Hemodialysis) 09/21/23 1246 10/11/23 1514   09/20/23 1200  vancomycin (VANCOCIN) 750 mg in sodium chloride 0.9 % 250 mL IVPB  Status:  Discontinued        750 mg 265 mL/hr over 60 Minutes Intravenous Every M-W-F (Hemodialysis) 09/20/23 0446 09/21/23 1246   09/20/23 1045  vancomycin (VANCOCIN) 750 mg in sodium chloride 0.9 % 250 mL IVPB        750 mg 265 mL/hr over 60 Minutes Intravenous  Once 09/20/23 0959 09/20/23 1226   09/20/23 0945  vancomycin (VANCOREADY) IVPB 750 mg/150 mL  Status:  Discontinued        750 mg 150 mL/hr over 60 Minutes Intravenous  Once 09/20/23 0855 09/20/23 0959   09/10/23 1200  vancomycin (VANCOREADY) IVPB 750 mg/150 mL  Status:  Discontinued        750 mg 150 mL/hr  over 60 Minutes Intravenous Every M-W-F (Hemodialysis) 09/09/23 2030 09/20/23 0446   09/09/23 2200  linezolid (ZYVOX) tablet 600 mg  Status:  Discontinued        600 mg Oral Every 12 hours 09/09/23 2035 09/10/23 0946       Subjective: Seen and examined at bedside.  Overnight events noted.  Patient reports doing much better. Patient is awaiting  skilled nursing facility , he apologized for his behavior.  Objective: Vitals:   10/13/23 1545 10/13/23 2021 10/13/23 2235 10/14/23 0533  BP: (!) 141/88 (!) 152/100 (!) 135/112 112/73  Pulse:  (!) 113 (!) 113 (!) 113  Resp: 20 18 18 18   Temp: 97.9 F (36.6 C) 98.4 F (36.9 C) 98.3 F (36.8 C) 98.3 F (36.8 C)  TempSrc: Axillary     SpO2: 100% 99% 97% 90%  Weight:      Height:        Intake/Output Summary (Last 24 hours) at 10/14/2023 1419 Last data filed at 10/14/2023 0643 Gross per 24 hour  Intake --  Output 0 ml  Net 0 ml   Filed Weights   10/11/23 0820 10/11/23 1244 10/12/23 0618  Weight: 96.2 kg 93.7 kg 77.7 kg    Examination:  General exam: Appears calm and comfortable, deconditioned, not in any acute distress. Respiratory system: Clear to auscultation. Respiratory effort normal.  RR 14 Cardiovascular system: S1 & S2 heard, RRR. No JVD, murmurs, rubs, gallops or clicks.  Gastrointestinal system: Abdomen is non distended, soft and non tender.  Normal bowel sounds heard. Central nervous system: Alert and oriented x 3. No focal neurological deficits. Extremities: No edema, left BKA.  Right toe infection noted Skin: No rashes, lesions or ulcers Psychiatry: Judgement and insight appear normal. Mood & affect appropriate.     Data Reviewed: I have personally reviewed following labs and imaging studies  CBC: Recent Labs  Lab 10/08/23 0920  WBC 6.2  HGB 7.9*  HCT 27.0*  MCV 95.1  PLT 236   Basic Metabolic Panel: Recent Labs  Lab 10/08/23 0920 10/10/23 1106  NA 136 136  K 5.4* 6.2*  CL 95* 96*  CO2 26 23   GLUCOSE 189* 188*  BUN 45* 46*  CREATININE 6.02* 5.88*  CALCIUM 9.0 9.3  PHOS 9.2*  --    GFR: Estimated Creatinine Clearance: 15 mL/min (A) (by C-G formula based on SCr of 5.88 mg/dL (H)). Liver Function Tests: Recent Labs  Lab 10/08/23 0920  ALBUMIN 3.1*   No results for input(s): "LIPASE", "AMYLASE" in the last 168 hours. No results for input(s): "AMMONIA" in the last 168 hours. Coagulation Profile: No results for input(s): "INR", "PROTIME" in the last 168 hours. Cardiac Enzymes: No results for input(s): "CKTOTAL", "CKMB", "CKMBINDEX", "TROPONINI" in the last 168 hours. BNP (last 3 results) No results for input(s): "PROBNP" in the last 8760 hours. HbA1C: No results for input(s): "HGBA1C" in the last 72 hours. CBG: Recent Labs  Lab 10/13/23 1231 10/13/23 1743 10/13/23 2024 10/14/23 0749 10/14/23 1159  GLUCAP 207* 238* 174* 403* 359*   Lipid Profile: No results for input(s): "CHOL", "HDL", "LDLCALC", "TRIG", "CHOLHDL", "LDLDIRECT" in the last 72 hours. Thyroid Function Tests: No results for input(s): "TSH", "T4TOTAL", "FREET4", "T3FREE", "THYROIDAB" in the last 72 hours. Anemia Panel: No results for input(s): "VITAMINB12", "FOLATE", "FERRITIN", "TIBC", "IRON", "RETICCTPCT" in the last 72 hours. Sepsis Labs: No results for input(s): "PROCALCITON", "LATICACIDVEN" in the last 168 hours.  No results found for this or any previous visit (from the past 240 hours).   Radiology Studies: No results found.  Scheduled Meds:  (feeding supplement) PROSource Plus  30 mL Oral BID BM   apixaban  5 mg Oral BID   bacitracin   Topical Daily   Chlorhexidine Gluconate Cloth  6 each Topical Q0600   darbepoetin (ARANESP) injection - DIALYSIS  100 mcg Subcutaneous Q Sun-1800   dextrose  1 Tube Oral Once   dextrose  25 mL Intravenous Once   gabapentin  100 mg Oral TID   Gerhardt's butt cream   Topical BID   influenza vac split trivalent PF  0.5 mL Intramuscular Tomorrow-1000    insulin aspart  0-6 Units Subcutaneous TID WC   insulin glargine-yfgn  6 Units Subcutaneous Daily   loratadine  10 mg Oral Daily   midodrine  10 mg Oral Q M,W,F-HD   pantoprazole  40 mg Oral Daily   povidone-Iodine   Topical Q0600   QUEtiapine  12.5 mg Oral q morning   QUEtiapine  25 mg Oral QHS   senna-docusate  2 tablet Oral BID   sevelamer carbonate  2,400 mg Oral TID WC   sodium zirconium cyclosilicate  10 g Oral TID   Continuous Infusions:  albumin human 60  mL/hr at 10/12/23 0600   vancomycin       LOS: 35 days    Time spent: 35 mins    Willeen Niece, MD [facility]   If 7PM-7AM, please contact night-coverage

## 2023-10-14 NOTE — Progress Notes (Signed)
Pharmacy Antibiotic Note  [name] is a 43 y.o. 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 Pt was transferred to [facility] for MV repair 11/29 and craniotomy with abscess drainage on 12/4. Pharmacy has been consulted for continued Vancomycin dosing.  ESRD- HD changed from MWF to TTS schedule.  HD done today 1/16.   Vancomycin level pre HD = 12 on Vancomycin 500mg  qHD-TTS.  This level is subtherapeutic, goal pre-HD level 15-25 mcg/ml.   Will increase Vancomycin dose.  Patient weight is 96.8 kg.   Vancomycin 500mg  post HD already given in HD tonight. Vancomycin end date is 10/28/23   Plan: Give additional Vancomycin 500mg  x1 tonight to total 1000 mg today then  increase  maintenance Vancomycin dose to 750 mg IV qHD- TTS thru 10/28/23. Will monitor HD tolerance and dosing, weekly pre HD Vanc level.    Height: 5\' 7"  (170.2 cm) Weight: 96.8 kg (213 lb 6.5 oz) IBW/kg (Calculated) : 66.1  Temp (24hrs), Avg:98.4 F (36.9 C), Min:98.2 F (36.8 C), Max:98.6 F (37 C)  Recent Labs  Lab 10/08/23 0920 10/10/23 1106 10/14/23 1504 10/14/23 1817  WBC 6.2  --  5.7  --   CREATININE 6.02* 5.88* 7.73*  --   VANCORANDOM  --   --   --  12    Estimated Creatinine Clearance: 13.5 mL/min (A) (by C-G formula based on SCr of 7.73 mg/dL (H)).    No Known Allergies  Antimicrobials this admission: Vancomycin 10/12 >>(10/28/23) Cefepime 10/21 >> 10/23; restart 10/25 >> 10/29 Linezolid 11/27 >> 12/10   Vancomycin levels  -10/17 preHD>>25 -10/24: VR= 16 -11/1: VR= 24 -11/8 VR 22 -11/15 VR 22  -11/22 VR 28 - held dose x1, decrease to 500mg  -11/28 VR 18 - cont 500mg  MWF 11/29 - Transferred to [facility] -12/11 VR 19.8 (at [facility]) - mostly 750mg  with HD (occasionally 500mg  instead) - 12/16 VR 25 mcg/mL- continue 750 mg iv MWF - 12/24 VR 27 mcg/mL- hold 1 dose / reduce to 500 mg iv MWF thereafter - 10/01/23 VR 15 - therapeutic  -09/1623 VR 12 -  subtherapeutic on 500 qHD TTS> increase to 750mg  qHD TTS.   Microbiology results: 10/12 MRSA PCR >> positive 10/12 CDiff antigen +, toxin -, PCR - >> negative 10/12 BCx >> 4/4 MRSA 10/13 BCx >> Negative 10/17 BCx >> Negative 10/21 RCx >> Negative 11/26 Bcx >> negative 11/27 Bcx >> Negative   Romie Minus, PharmD PGY1 Pharmacy Resident  Please check AMION for all [facility] Pharmacy phone numbers After 10:00 PM, call Main Pharmacy [phone] 10/14/2023, 8:17 PM

## 2023-10-15 DIAGNOSIS — N250 Renal osteodystrophy: Secondary | ICD-10-CM | POA: Diagnosis not present

## 2023-10-15 DIAGNOSIS — D631 Anemia in chronic kidney disease: Secondary | ICD-10-CM | POA: Diagnosis not present

## 2023-10-15 DIAGNOSIS — B9562 Methicillin resistant Staphylococcus aureus infection as the cause of diseases classified elsewhere: Secondary | ICD-10-CM | POA: Diagnosis not present

## 2023-10-15 DIAGNOSIS — Z992 Dependence on renal dialysis: Secondary | ICD-10-CM | POA: Diagnosis not present

## 2023-10-15 DIAGNOSIS — I38 Endocarditis, valve unspecified: Secondary | ICD-10-CM | POA: Diagnosis not present

## 2023-10-15 DIAGNOSIS — I330 Acute and subacute infective endocarditis: Secondary | ICD-10-CM | POA: Diagnosis not present

## 2023-10-15 DIAGNOSIS — R7881 Bacteremia: Secondary | ICD-10-CM | POA: Diagnosis not present

## 2023-10-15 DIAGNOSIS — N186 End stage renal disease: Secondary | ICD-10-CM | POA: Diagnosis not present

## 2023-10-15 DIAGNOSIS — I120 Hypertensive chronic kidney disease with stage 5 chronic kidney disease or end stage renal disease: Secondary | ICD-10-CM | POA: Diagnosis not present

## 2023-10-15 LAB — GLUCOSE, CAPILLARY
Glucose-Capillary: 209 mg/dL — ABNORMAL HIGH (ref 70–99)
Glucose-Capillary: 339 mg/dL — ABNORMAL HIGH (ref 70–99)
Glucose-Capillary: 310 mg/dL — ABNORMAL HIGH (ref 70–99)
Glucose-Capillary: 381 mg/dL — ABNORMAL HIGH (ref 70–99)

## 2023-10-15 MED ORDER — INSULIN ASPART 100 UNIT/ML IJ SOLN
0.0000 [IU] | Freq: Every day | INTRAMUSCULAR | Status: DC
  Administered 2023-10-15: 4 [IU] via SUBCUTANEOUS
  Administered 2023-10-18 – 2023-10-21 (×2): 2 [IU] via SUBCUTANEOUS
  Administered 2023-10-22: 3 [IU] via SUBCUTANEOUS
  Administered 2023-10-25 – 2023-10-27 (×3): 2 [IU] via SUBCUTANEOUS
  Administered 2023-10-28: 3 [IU] via SUBCUTANEOUS
  Administered 2023-10-29: 2 [IU] via SUBCUTANEOUS
  Administered 2023-10-30: 4 [IU] via SUBCUTANEOUS

## 2023-10-15 MED ORDER — INSULIN ASPART 100 UNIT/ML IJ SOLN
0.0000 [IU] | Freq: Three times a day (TID) | INTRAMUSCULAR | Status: DC
  Administered 2023-10-16: 3 [IU] via SUBCUTANEOUS
  Administered 2023-10-16: 4 [IU] via SUBCUTANEOUS
  Administered 2023-10-17 – 2023-10-18 (×4): 1 [IU] via SUBCUTANEOUS
  Administered 2023-10-18: 3 [IU] via SUBCUTANEOUS
  Administered 2023-10-19 – 2023-10-22 (×2): 1 [IU] via SUBCUTANEOUS
  Administered 2023-10-22 (×2): 3 [IU] via SUBCUTANEOUS
  Administered 2023-10-25: 1 [IU] via SUBCUTANEOUS
  Administered 2023-10-27 (×2): 2 [IU] via SUBCUTANEOUS
  Administered 2023-10-27 – 2023-10-28 (×2): 1 [IU] via SUBCUTANEOUS
  Administered 2023-10-29: 4 [IU] via SUBCUTANEOUS
  Administered 2023-10-29: 3 [IU] via SUBCUTANEOUS
  Administered 2023-10-29: 5 [IU] via SUBCUTANEOUS
  Administered 2023-10-30 – 2023-11-01 (×3): 1 [IU] via SUBCUTANEOUS
  Administered 2023-11-02 (×2): 3 [IU] via SUBCUTANEOUS
  Administered 2023-11-03: 1 [IU] via SUBCUTANEOUS
  Administered 2023-11-03: 5 [IU] via SUBCUTANEOUS
  Administered 2023-11-03 – 2023-11-04 (×2): 1 [IU] via SUBCUTANEOUS
  Administered 2023-11-05: 3 [IU] via SUBCUTANEOUS
  Administered 2023-11-05: 5 [IU] via SUBCUTANEOUS

## 2023-10-15 NOTE — Progress Notes (Signed)
PROGRESS NOTE    [name]  JWJ:[id number] DOB: Oct 27, 1980 DOA: 09/09/2023 PCP: [name], NP   Brief Narrative:  This 43 yrs old male with PMH significant for ESRD on HD, DM 1, gastroparesis, Neuropathy, diabetic foot s/p prior left AKA, recent MRSA endocarditis, stroke 10/11, patient was hospitalized for DKA.  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   11/29, patient was transferred to [facility] for MV repair.  While at [facility] he underwent cardiac cath showed non obstructive disease, craniotomy with abscess drainage on 09/01/23.  Course over there was complicated by subarachnoid hemorrhage while on IV heparin as well as right third toe distal necrosis.  He was seen by podiatry, but family wanted conservative management. Since his tertiary care needs have been completed, he was transferred back to [facility] for further management on 12/12. PTOT  recommending SNF and awaiting placement.   Assessment & Plan:   Principal Problem:   Endocarditis Active Problems:   MRSA bacteremia   Endocarditis of tricuspid valve   ESRD on dialysis ([name])   Cognitive impairment   Insulin dependent type 1 diabetes mellitus ([name])   Diabetic gastroparesis ([name])   Diabetic foot infection ([name])   Endocarditis of mitral valve   Cerebrovascular accident (CVA) due to embolism of cerebral artery ([name])   Hypotension : ESRD on HD Currently on midodrine MWF. Metoprolol on hold. Post discharge, Continue dialysis schedule per nephrology Caseworker and renal navigator working on outpatient chair arrangement.   MRSA TV/MV endocarditis: S/p MV repair at [facility] -11/29 10/12, blood cultures positive for MRSA. 10/13, TTE showed LVEF of 25-30% with concern for AV/MV abnormality . 10/16 and 10/22, TEE showed MV/TV vegetations.  11/29, s/p MV  repair at [facility] . Currently on IV vancomycin.  Per ID recommendation, to complete 8 weeks of treatment ( EOT 1/29) Outpatient ID follow-up with appointment scheduled for 10/12/2023. May need that rescheduled if patient is still remains in the hospital. While at [facility], patient underwent a left heart catheterization which was significant for nonobstructive disease.   Acute CVA: Secondary to septic emboli.   10/17, MRI showed large right MCA branch infarct.  11/25, MRA head showed significant for large necrotic appearing lesion within the right frontal lobe within area of the prior right MCA territory infarct with vasogenic edema and 8 mm leftward midline shift.  Stroke workup completed.   Neurology recommendations for Eliquis.  PT/OT recommendations for SNF.   Cerebral emboli with abscess / Cerebritis: Secondary to MRSA endocarditis.  12/4, while at [facility], patient was evaluated by neurosurgery who performed a craniotomy for abscess drainage. Follow-up as an outpatient.   Prostate fluid collection/abscess: Seen by urology at [facility].  Per their assessment, fluid collection was secondary to dilated seminal vesicle with no surgical intervention recommended.  Infectious disease with continued concern for possible abscess. Patient covered with vancomycin with recommendation for outpatient urology follow-up.   Type 1 diabetes mellitus: HbA1c 8.9 on 07/10/2023 Currently on Semglee 6 units daily and SSI with Accu-Checks. Blood sugar level mostly under 200.  Anemia of chronic kidney disease Hemoglobin stable.  Acute left upper extremity DVT: Continue Eliquis.   Right third toe necrosis: H/o prior left BKA: Per documentation, patient/family opted for conservative management and declined surgical management. Patient was seen by PT/OT with recommendation for SNF on discharge.   Mild  cognitive dysfunction: Behavioral changes Likely secondary to brain abscess, craniotomy Currently  stable on Seroquel.   Pressure injury Left buttock. Not present on admission.   Goals of care   Code Status: Full Code    DVT prophylaxis: Eliquis Code Status: Full code Family Communication: No Family at bedside Disposition Plan:   Status is: Inpatient Remains inpatient appropriate because: SNF not taking because of behavioral issues. TOC working on finding a different SNF to accept the patient.   Consultants:  Neurology  Procedures: None  Antimicrobials:  Anti-infectives (From admission, onward)    Start     Dose/Rate Route Frequency Ordered Stop   10/16/23 1200  vancomycin (VANCOREADY) IVPB 750 mg/150 mL        750 mg 150 mL/hr over 60 Minutes Intravenous Every T-Th-Sa (Hemodialysis) 10/14/23 1843 10/30/23 1159   10/14/23 1845  vancomycin (VANCOREADY) IVPB 500 mg/100 mL        500 mg 100 mL/hr over 60 Minutes Intravenous  Once 10/14/23 1843 10/15/23 0806   10/14/23 1200  vancomycin (VANCOREADY) IVPB 500 mg/100 mL  Status:  Discontinued        500 mg 100 mL/hr over 60 Minutes Intravenous Every T-Th-Sa (Hemodialysis) 10/13/23 1309 10/14/23 1843   10/13/23 1200  vancomycin (VANCOREADY) IVPB 500 mg/100 mL  Status:  Discontinued        500 mg 100 mL/hr over 60 Minutes Intravenous Every M-W-F (Hemodialysis) 10/11/23 1517 10/13/23 1309   10/12/23 1200  vancomycin (VANCOREADY) IVPB 500 mg/100 mL  Status:  Discontinued        500 mg 100 mL/hr over 60 Minutes Intravenous Every T-Th-Sa (Hemodialysis) 10/11/23 1514 10/11/23 1517   09/24/23 1200  vancomycin (VANCOREADY) IVPB 500 mg/100 mL  Status:  Discontinued        500 mg 100 mL/hr over 60 Minutes Intravenous Every M-W-F (Hemodialysis) 09/21/23 1246 10/11/23 1514   09/20/23 1200  vancomycin (VANCOCIN) 750 mg in sodium chloride 0.9 % 250 mL IVPB  Status:  Discontinued        750 mg 265 mL/hr over 60 Minutes Intravenous Every M-W-F (Hemodialysis) 09/20/23 0446 09/21/23 1246   09/20/23 1045  vancomycin (VANCOCIN) 750 mg in  sodium chloride 0.9 % 250 mL IVPB        750 mg 265 mL/hr over 60 Minutes Intravenous  Once 09/20/23 0959 09/20/23 1226   09/20/23 0945  vancomycin (VANCOREADY) IVPB 750 mg/150 mL  Status:  Discontinued        750 mg 150 mL/hr over 60 Minutes Intravenous  Once 09/20/23 0855 09/20/23 0959   09/10/23 1200  vancomycin (VANCOREADY) IVPB 750 mg/150 mL  Status:  Discontinued        750 mg 150 mL/hr over 60 Minutes Intravenous Every M-W-F (Hemodialysis) 09/09/23 2030 09/20/23 0446   09/09/23 2200  linezolid (ZYVOX) tablet 600 mg  Status:  Discontinued        600 mg Oral Every 12 hours 09/09/23 2035 09/10/23 0946       Subjective: Seen and examined at bedside.  Overnight events noted.   Patient reports feeling much better.  Patient is awaiting  skilled nursing facility , He apologized for his behavior.  Objective: Vitals:   10/14/23 1806 10/14/23 1859 10/14/23 2013 10/15/23 0212  BP: (!) 117/50 (!) 117/97 120/73 (!) 109/55  Pulse: (!) 119 (!) 121 (!) 121 (!) 115  Resp: 14 18 20    Temp: 98.2 F (36.8 C)  98.6 F (37 C) 98.6 F (37 C)  TempSrc:  Oral Oral  SpO2: 97%  100% 100%  Weight:      Height:        Intake/Output Summary (Last 24 hours) at 10/15/2023 1422 Last data filed at 10/15/2023 0600 Gross per 24 hour  Intake 480 ml  Output 2700 ml  Net -2220 ml   Filed Weights   10/11/23 1244 10/12/23 0618 10/14/23 1436  Weight: 93.7 kg 77.7 kg 96.8 kg    Examination:  General exam: Appears comfortable, deconditioned, not in any acute distress. Respiratory system: CTA bilaterally. respiratory effort normal.  RR 14 Cardiovascular system: S1 & S2 heard, RRR. No JVD, murmurs, rubs, gallops or clicks.  Gastrointestinal system: Abdomen is non distended, soft and non tender.  Normal bowel sounds heard. Central nervous system: Alert and oriented x 3. No focal neurological deficits. Extremities: No edema, left BKA.  Right toe infection noted Skin: No rashes, lesions or  ulcers Psychiatry: Judgement and insight appear normal. Mood & affect appropriate.     Data Reviewed: I have personally reviewed following labs and imaging studies  CBC: Recent Labs  Lab 10/14/23 1504  WBC 5.7  HGB 8.5*  HCT 29.6*  MCV 96.4  PLT 156   Basic Metabolic Panel: Recent Labs  Lab 10/10/23 1106 10/14/23 1504  NA 136 136  K 6.2* 5.9*  CL 96* 94*  CO2 23 26  GLUCOSE 188* 289*  BUN 46* 62*  CREATININE 5.88* 7.73*  CALCIUM 9.3 9.1  MG  --  2.5*  PHOS  --  9.8*   GFR: Estimated Creatinine Clearance: 13.5 mL/min (A) (by C-G formula based on SCr of 7.73 mg/dL (H)). Liver Function Tests: No results for input(s): "AST", "ALT", "ALKPHOS", "BILITOT", "PROT", "ALBUMIN" in the last 168 hours.  No results for input(s): "LIPASE", "AMYLASE" in the last 168 hours. No results for input(s): "AMMONIA" in the last 168 hours. Coagulation Profile: No results for input(s): "INR", "PROTIME" in the last 168 hours. Cardiac Enzymes: No results for input(s): "CKTOTAL", "CKMB", "CKMBINDEX", "TROPONINI" in the last 168 hours. BNP (last 3 results) No results for input(s): "PROBNP" in the last 8760 hours. HbA1C: No results for input(s): "HGBA1C" in the last 72 hours. CBG: Recent Labs  Lab 10/14/23 1159 10/14/23 1855 10/14/23 2018 10/15/23 0749 10/15/23 1132  GLUCAP 359* 128* 120* 209* 339*   Lipid Profile: No results for input(s): "CHOL", "HDL", "LDLCALC", "TRIG", "CHOLHDL", "LDLDIRECT" in the last 72 hours. Thyroid Function Tests: No results for input(s): "TSH", "T4TOTAL", "FREET4", "T3FREE", "THYROIDAB" in the last 72 hours. Anemia Panel: No results for input(s): "VITAMINB12", "FOLATE", "FERRITIN", "TIBC", "IRON", "RETICCTPCT" in the last 72 hours. Sepsis Labs: No results for input(s): "PROCALCITON", "LATICACIDVEN" in the last 168 hours.  No results found for this or any previous visit (from the past 240 hours).   Radiology Studies: No results found.  Scheduled  Meds:  (feeding supplement) PROSource Plus  30 mL Oral BID BM   apixaban  5 mg Oral BID   bacitracin   Topical Daily   Chlorhexidine Gluconate Cloth  6 each Topical Q0600   darbepoetin (ARANESP) injection - DIALYSIS  100 mcg Subcutaneous Q Sun-1800   dextrose  1 Tube Oral Once   dextrose  25 mL Intravenous Once   gabapentin  100 mg Oral TID   Gerhardt's butt cream   Topical BID   influenza vac split trivalent PF  0.5 mL Intramuscular Tomorrow-1000   insulin aspart  0-6 Units Subcutaneous TID WC   insulin glargine-yfgn  6 Units Subcutaneous  Daily   loratadine  10 mg Oral Daily   midodrine  10 mg Oral Q M,W,F-HD   pantoprazole  40 mg Oral Daily   povidone-Iodine   Topical Q0600   QUEtiapine  12.5 mg Oral q morning   QUEtiapine  25 mg Oral QHS   senna-docusate  2 tablet Oral BID   sevelamer carbonate  2,400 mg Oral TID WC   sodium zirconium cyclosilicate  10 g Oral TID   Continuous Infusions:  albumin human 60 mL/hr at 10/12/23 0600   [START ON 10/16/2023] vancomycin       LOS: 36 days    Time spent: 35 mins    Willeen Niece, MD [facility]   If 7PM-7AM, please contact night-coverage

## 2023-10-15 NOTE — TOC Progression Note (Signed)
Transition of Care [facility]) - Progression Note    Patient Details  Name: [name] MRN: [id number] Date of Birth: November 06, 1980  Transition of Care [facility]) CM/SW Contact  Erin Sons, [name] Phone Number: 10/15/2023, 2:03 PM  Clinical Narrative:     CSW explored [facility] as alternative option though [facility] cannot offer bed due to behaviors.   CSW contacted [employer] and informed again they would have to re-evaluate pt next week.   TOC continuing to follow.   Expected Discharge Plan: Skilled Nursing Facility Barriers to Discharge: Continued Medical Work up                     Social Determinants of Health (SDOH) Interventions SDOH Screenings   Food Insecurity: No Food Insecurity (09/09/2023)  Housing: Unknown (09/09/2023)  Transportation Needs: No Transportation Needs (09/09/2023)  Utilities: Not At Risk (09/09/2023)  Alcohol Screen: Low Risk  (02/15/2023)  Depression (PHQ2-9): Low Risk  (02/15/2023)  Financial Resource Strain: Low Risk  (02/15/2023)  Physical Activity: Inactive (02/15/2023)  Social Connections: Socially Integrated (02/15/2023)  Stress: No Stress Concern Present (02/15/2023)  Tobacco Use: Low Risk  (09/12/2023)  Recent Concern: Tobacco Use - Medium Risk (09/01/2023)   Received from [facility]    Readmission Risk Interventions     No data to display

## 2023-10-15 NOTE — Progress Notes (Addendum)
[facility] Progress Note   Subjective:   Seen in room - lying in bed comfortably. For HD tomorrow. Patient pleasant and conversant. Denies CP/dyspnea/N/V. No new events.   No new events.  Continue to await SNF bed  Objective Vitals:   10/14/23 1806 10/14/23 1859 10/14/23 2013 10/15/23 0212  BP: (!) 117/50 (!) 117/97 120/73 (!) 109/55  Pulse: (!) 119 (!) 121 (!) 121 (!) 115  Resp: 14 18 20    Temp: 98.2 F (36.8 C)  98.6 F (37 C) 98.6 F (37 C)  TempSrc:   Oral Oral  SpO2: 97%  100% 100%  Weight:      Height:       Physical Exam General: chronically ill appearing man, lying comfortably in bed. NAD Heart: 2/6 murmur, RRR Lungs: CTAB with decreased breath sounds Abdomen: soft, non-tender, tight edema in abdomen  Extremities: B/l leg edema +1 Dialysis Access: L femoral TDC  Additional Objective Labs: Basic Metabolic Panel: Recent Labs  Lab 10/10/23 1106 10/14/23 1504  NA 136 136  K 6.2* 5.9*  CL 96* 94*  CO2 23 26  GLUCOSE 188* 289*  BUN 46* 62*  CREATININE 5.88* 7.73*  CALCIUM 9.3 9.1  PHOS  --  9.8*   CBC: Recent Labs  Lab 10/14/23 1504  WBC 5.7  HGB 8.5*  HCT 29.6*  MCV 96.4  PLT 156    CBG: Recent Labs  Lab 10/14/23 0749 10/14/23 1159 10/14/23 1855 10/14/23 2018 10/15/23 0749  GLUCAP 403* 359* 128* 120* 209*    Medications:  albumin human 60 mL/hr at 10/12/23 0600   [START ON 10/16/2023] vancomycin      (feeding supplement) PROSource Plus  30 mL Oral BID BM   apixaban  5 mg Oral BID   bacitracin   Topical Daily   Chlorhexidine Gluconate Cloth  6 each Topical Q0600   darbepoetin (ARANESP) injection - DIALYSIS  100 mcg Subcutaneous Q Sun-1800   dextrose  1 Tube Oral Once   dextrose  25 mL Intravenous Once   gabapentin  100 mg Oral TID   Gerhardt's butt cream   Topical BID   influenza vac split trivalent PF  0.5 mL Intramuscular Tomorrow-1000   insulin aspart  0-6 Units Subcutaneous TID WC   insulin glargine-yfgn  6  Units Subcutaneous Daily   loratadine  10 mg Oral Daily   midodrine  10 mg Oral Q M,W,F-HD   pantoprazole  40 mg Oral Daily   povidone-Iodine   Topical Q0600   QUEtiapine  12.5 mg Oral q morning   QUEtiapine  25 mg Oral QHS   senna-docusate  2 tablet Oral BID   sevelamer carbonate  2,400 mg Oral TID WC   sodium zirconium cyclosilicate  10 g Oral TID    Dialysis Orders MWF - [facility] 4hr, 400/A1.5, EDW 77kg, L femoral TDC, 2K/3Ca bath, no heparin  Assessment/Plan: MRSA bacteremia with MV/TV endocarditis + brain abscess:  Not a candidate for valve surgery here, S/p line holiday and brain abscess drainage (at [facility]). Continues on extended Vanc course - will be on Vanc IV q HD until 10/27/23. MRSA brain abscess: S/p transfer to [facility] and drainage 08/31/24 Acute B CVA/septic emboli Acute LUE DVT: On Eliquis ESRD: Transitioned to TTS schedule this week from MWF schedule. HD tomorrow with UFG 5L.  HTN/volume: BP stable, CT with diffuse anasarca. Significantly above EDW. UFG 5L tomorrow.  Anemia of ESRD: Hgb 8.5, Aranesp increased 150 mcg q  Sunday while here. No IV iron while on abx. Secondary HPTH: CorrCa high, phos high. Continue to hold calcitriol. Increased dose of sevelamer to 3/meals. Hyperkalemia: Improved but remains elevated. Managing with Lokelma and HD.  Nutrition: Alb low, continue suppl.  T1DM: per primary Dispo: SNF being arranged, but there were concerns about behaviors. Patient states he is working on it.   Damian Leavell, PA-S Shane Hoyle, PA-C 10/15/2023, 11:12 AM  [facility]  I saw the patient and agree with the above assessment and plan.   Disposition remains unresolved.  Arita Miss, MD

## 2023-10-16 ENCOUNTER — Inpatient Hospital Stay (HOSPITAL_COMMUNITY): Payer: Medicare HMO

## 2023-10-16 ENCOUNTER — Other Ambulatory Visit (HOSPITAL_COMMUNITY): Payer: Medicare HMO

## 2023-10-16 DIAGNOSIS — D631 Anemia in chronic kidney disease: Secondary | ICD-10-CM | POA: Diagnosis not present

## 2023-10-16 DIAGNOSIS — I120 Hypertensive chronic kidney disease with stage 5 chronic kidney disease or end stage renal disease: Secondary | ICD-10-CM | POA: Diagnosis not present

## 2023-10-16 DIAGNOSIS — B9562 Methicillin resistant Staphylococcus aureus infection as the cause of diseases classified elsewhere: Secondary | ICD-10-CM | POA: Diagnosis not present

## 2023-10-16 DIAGNOSIS — R7881 Bacteremia: Secondary | ICD-10-CM | POA: Diagnosis not present

## 2023-10-16 DIAGNOSIS — G9341 Metabolic encephalopathy: Secondary | ICD-10-CM | POA: Diagnosis not present

## 2023-10-16 DIAGNOSIS — I38 Endocarditis, valve unspecified: Secondary | ICD-10-CM | POA: Diagnosis not present

## 2023-10-16 DIAGNOSIS — Z992 Dependence on renal dialysis: Secondary | ICD-10-CM | POA: Diagnosis not present

## 2023-10-16 DIAGNOSIS — N186 End stage renal disease: Secondary | ICD-10-CM | POA: Diagnosis not present

## 2023-10-16 DIAGNOSIS — R4189 Other symptoms and signs involving cognitive functions and awareness: Secondary | ICD-10-CM | POA: Diagnosis not present

## 2023-10-16 DIAGNOSIS — I330 Acute and subacute infective endocarditis: Secondary | ICD-10-CM | POA: Diagnosis not present

## 2023-10-16 DIAGNOSIS — N250 Renal osteodystrophy: Secondary | ICD-10-CM | POA: Diagnosis not present

## 2023-10-16 LAB — CBC
HCT: 28.6 % — ABNORMAL LOW (ref 39.0–52.0)
Hemoglobin: 8.5 g/dL — ABNORMAL LOW (ref 13.0–17.0)
MCH: 27.8 pg (ref 26.0–34.0)
MCHC: 29.7 g/dL — ABNORMAL LOW (ref 30.0–36.0)
MCV: 93.5 fL (ref 80.0–100.0)
Platelets: 147 10*3/uL — ABNORMAL LOW (ref 150–400)
RBC: 3.06 MIL/uL — ABNORMAL LOW (ref 4.22–5.81)
RDW: 15.8 % — ABNORMAL HIGH (ref 11.5–15.5)
WBC: 5.6 10*3/uL (ref 4.0–10.5)
nRBC: 0 % (ref 0.0–0.2)

## 2023-10-16 LAB — BASIC METABOLIC PANEL
Anion gap: 17 — ABNORMAL HIGH (ref 5–15)
BUN: 52 mg/dL — ABNORMAL HIGH (ref 6–20)
CO2: 24 mmol/L (ref 22–32)
Calcium: 9.3 mg/dL (ref 8.9–10.3)
Chloride: 94 mmol/L — ABNORMAL LOW (ref 98–111)
Creatinine, Ser: 6.77 mg/dL — ABNORMAL HIGH (ref 0.61–1.24)
GFR, Estimated: 10 mL/min — ABNORMAL LOW (ref >60)
Glucose, Bld: 281 mg/dL — ABNORMAL HIGH (ref 70–99)
Potassium: 6.2 mmol/L — ABNORMAL HIGH (ref 3.5–5.1)
Sodium: 135 mmol/L (ref 135–145)

## 2023-10-16 LAB — POTASSIUM: Potassium: 4.4 mmol/L (ref 3.5–5.1)

## 2023-10-16 LAB — GLUCOSE, CAPILLARY
Glucose-Capillary: 249 mg/dL — ABNORMAL HIGH (ref 70–99)
Glucose-Capillary: 310 mg/dL — ABNORMAL HIGH (ref 70–99)
Glucose-Capillary: 168 mg/dL — ABNORMAL HIGH (ref 70–99)
Glucose-Capillary: 160 mg/dL — ABNORMAL HIGH (ref 70–99)

## 2023-10-16 LAB — PHOSPHORUS: Phosphorus: 8.3 mg/dL — ABNORMAL HIGH (ref 2.5–4.6)

## 2023-10-16 LAB — MAGNESIUM: Magnesium: 2.3 mg/dL (ref 1.7–2.4)

## 2023-10-16 MED ORDER — ALBUMIN HUMAN 25 % IV SOLN
25.0000 g | Freq: Once | INTRAVENOUS | Status: AC
  Administered 2023-10-16: 25 g via INTRAVENOUS

## 2023-10-16 MED ORDER — VANCOMYCIN HCL 1000 MG IV SOLR
750.0000 mg | INTRAVENOUS | Status: DC
  Administered 2023-10-16 – 2023-10-19 (×2): 750 mg via INTRAVENOUS
  Filled 2023-10-16 (×3): qty 15

## 2023-10-16 MED ORDER — INSULIN GLARGINE-YFGN 100 UNIT/ML ~~LOC~~ SOLN
8.0000 [IU] | Freq: Every day | SUBCUTANEOUS | Status: DC
  Administered 2023-10-16 – 2023-10-21 (×6): 8 [IU] via SUBCUTANEOUS
  Filled 2023-10-16 (×6): qty 0.08

## 2023-10-16 NOTE — Plan of Care (Signed)

## 2023-10-16 NOTE — Progress Notes (Signed)
[facility]                                                                               [name], is a 43 y.o. male, DOB - 11-Sep-1981, MVH:[id number] Admit date - 09/09/2023    Outpatient Primary MD for the patient is [name], NP  LOS - 37  days    Brief summary   [name] is a 43 y.o. male with medical history significant of ESRD on hemodialysis (not fully compliant), insulin-dependent type 1 diabetes, gastroparesis, Left AKA, admitted to [facility] on 07/09/23 for DKA, Sepsis and hospital course significant for MV AND TV  MRSA endocarditis, followed by acute MCA stroke , left occipital and bilateral cerebellar strokes consistent with septic emboli,  Brain abscess from MRSA, left IJ DVT was transferred to [facility] for MV repair on 11/29.  While at [facility] he underwent cardiac cath showing non obstructive disease, craniotomy with abscess drainage on 09/01/23,. He had subarachnoid hemorrhage while on IV heparin, right third toe distal necrosis,was seen by podiatry, but family wanted conservative management. Since his tertiary care needs have been completed, he was transferred back to [facility] for further management.  PTOT  recommending SNF and awaiting placement.   Assessment & Plan    Assessment and Plan:   MRSA TV/MV endocarditis 10/12, blood cultures positive for MRSA. 10/13, TTE showed LVEF of 25-30% with concern for AV/MV abnormality  10/16 and 10/22, TEE showed MV/TV vegetations.  Currently on IV vancomycin.  Per ID recommendation, to complete 8 weeks of treatment EOT 1/29. Recommend outpatient follow up with ID.    Hypotension Resolved.  On Midodrine.     ESRD on HD Hyperkalemia Started on lokelma. Plan for HD today.  Check potassium later today.     Acute CVA. / ACUTE metabolic encephalopathy.  Mri brain on 10/17 showed large right MCA infarct.  11/25, MRA head showed significant for large necrotic appearing lesion within the right frontal  lobe within area of the prior right MCA territory infarct with vasogenic edema and 8 mm leftward midline shift.  Neurology recommendations for Eliquis.  PT/OT recommendations for SNF. Today mother reports that he is not at baseline. Slightly confused. Check MRI brain without contrast for further evaluation.   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  S/p craniotomy and abscess drainage.    Type 1 dm uncontrolled with hyperglycemia.  CBG (last 3)  Recent Labs    10/15/23 1709 10/15/23 2125 10/16/23 0718  GLUCAP 310* 381* 249*   Resume SSI. Increase Semglee to 8 units daily.    Acute left upper extremity DVT Resume Eliquis.  Left upper extremity more swollen and slightly tender.  Venous duplex of the left upper extremity ordered.    Anemia of chronic kidney disease H&H stable.    Right third toe necrosis: H/o prior left BKA: Per documentation, patient/family opted for conservative management and declined surgical management. Patient was seen by PT/OT with recommendation for SNF on discharge.  Prostate fluid collection Urology evaluated patient at [facility], suggested that its a dilated seminal vesicle without any surgical intervention.     Mild cognitive dysfunction with behavioral  changes.  Currently stable on seroquel.      Estimated body mass index is 33.42 kg/m as calculated from the following:   Height as of this encounter: 5\' 7"  (1.702 m).   Weight as of this encounter: 96.8 kg.  Code Status: full code.  DVT Prophylaxis:   apixaban (ELIQUIS) tablet 5 mg   Level of Care: Level of care: Telemetry Medical Family Communication: none at bedside.   Disposition Plan:     Remains inpatient appropriate:  awaiting safe disposition.   Procedures:  None.   Consultants:   Nephrology.   Antimicrobials:   Anti-infectives (From admission, onward)    Start     Dose/Rate Route Frequency Ordered Stop   10/16/23 1200  vancomycin  (VANCOREADY) IVPB 750 mg/150 mL        750 mg 150 mL/hr over 60 Minutes Intravenous Every T-Th-Sa (Hemodialysis) 10/14/23 1843 10/30/23 1159   10/14/23 1845  vancomycin (VANCOREADY) IVPB 500 mg/100 mL        500 mg 100 mL/hr over 60 Minutes Intravenous  Once 10/14/23 1843 10/15/23 0806   10/14/23 1200  vancomycin (VANCOREADY) IVPB 500 mg/100 mL  Status:  Discontinued        500 mg 100 mL/hr over 60 Minutes Intravenous Every T-Th-Sa (Hemodialysis) 10/13/23 1309 10/14/23 1843   10/13/23 1200  vancomycin (VANCOREADY) IVPB 500 mg/100 mL  Status:  Discontinued        500 mg 100 mL/hr over 60 Minutes Intravenous Every M-W-F (Hemodialysis) 10/11/23 1517 10/13/23 1309   10/12/23 1200  vancomycin (VANCOREADY) IVPB 500 mg/100 mL  Status:  Discontinued        500 mg 100 mL/hr over 60 Minutes Intravenous Every T-Th-Sa (Hemodialysis) 10/11/23 1514 10/11/23 1517   09/24/23 1200  vancomycin (VANCOREADY) IVPB 500 mg/100 mL  Status:  Discontinued        500 mg 100 mL/hr over 60 Minutes Intravenous Every M-W-F (Hemodialysis) 09/21/23 1246 10/11/23 1514   09/20/23 1200  vancomycin (VANCOCIN) 750 mg in sodium chloride 0.9 % 250 mL IVPB  Status:  Discontinued        750 mg 265 mL/hr over 60 Minutes Intravenous Every M-W-F (Hemodialysis) 09/20/23 0446 09/21/23 1246   09/20/23 1045  vancomycin (VANCOCIN) 750 mg in sodium chloride 0.9 % 250 mL IVPB        750 mg 265 mL/hr over 60 Minutes Intravenous  Once 09/20/23 0959 09/20/23 1226   09/20/23 0945  vancomycin (VANCOREADY) IVPB 750 mg/150 mL  Status:  Discontinued        750 mg 150 mL/hr over 60 Minutes Intravenous  Once 09/20/23 0855 09/20/23 0959   09/10/23 1200  vancomycin (VANCOREADY) IVPB 750 mg/150 mL  Status:  Discontinued        750 mg 150 mL/hr over 60 Minutes Intravenous Every M-W-F (Hemodialysis) 09/09/23 2030 09/20/23 0446   09/09/23 2200  linezolid (ZYVOX) tablet 600 mg  Status:  Discontinued        600 mg Oral Every 12 hours 09/09/23 2035  09/10/23 0946        Medications  Scheduled Meds:  (feeding supplement) PROSource Plus  30 mL Oral BID BM   apixaban  5 mg Oral BID   bacitracin   Topical Daily   Chlorhexidine Gluconate Cloth  6 each Topical Q0600   darbepoetin (ARANESP) injection - DIALYSIS  100 mcg Subcutaneous Q Sun-1800   dextrose  1 Tube Oral Once   dextrose  25 mL Intravenous Once   gabapentin  100 mg Oral TID   Gerhardt's butt cream   Topical BID   influenza vac split trivalent PF  0.5 mL Intramuscular Tomorrow-1000   insulin aspart  0-5 Units Subcutaneous QHS   insulin aspart  0-6 Units Subcutaneous TID WC   insulin glargine-yfgn  6 Units Subcutaneous Daily   loratadine  10 mg Oral Daily   midodrine  10 mg Oral Q M,W,F-HD   pantoprazole  40 mg Oral Daily   povidone-Iodine   Topical Q0600   QUEtiapine  12.5 mg Oral q morning   QUEtiapine  25 mg Oral QHS   senna-docusate  2 tablet Oral BID   sevelamer carbonate  2,400 mg Oral TID WC   sodium zirconium cyclosilicate  10 g Oral TID   Continuous Infusions:  albumin human 60 mL/hr at 10/12/23 0600   vancomycin     PRN Meds:.acetaminophen, albumin human, clonazepam, diphenhydrAMINE, diphenhydrAMINE-zinc acetate, Gerhardt's butt cream, hydrALAZINE, HYDROmorphone (DILAUDID) injection, loperamide, metoprolol tartrate, ondansetron (ZOFRAN) IV, mouth rinse, oxyCODONE, polyethylene glycol    Subjective:   [name] was seen and examined today.  No new complaints.   Objective:   Vitals:   10/15/23 1711 10/15/23 2119 10/16/23 0449 10/16/23 0718  BP: 120/82 119/76 108/81 (!) 121/95  Pulse: (!) 114 (!) 40 (!) 116 (!) 117  Resp: 18 20 20 19   Temp:  98.2 F (36.8 C) (!) 97.5 F (36.4 C)   TempSrc:  Oral    SpO2: 92% 92% 92% 91%  Weight:      Height:        Intake/Output Summary (Last 24 hours) at 10/16/2023 0854 Last data filed at 10/16/2023 0449 Gross per 24 hour  Intake 600 ml  Output 0 ml  Net 600 ml   Filed Weights   10/11/23 1244  10/12/23 0618 10/14/23 1436  Weight: 93.7 kg 77.7 kg 96.8 kg     Exam General exam: Appears calm and comfortable  Respiratory system: Clear to auscultation. Respiratory effort normal. Cardiovascular system: S1 & S2 heard, RRR. No JVD, Gastrointestinal system: Abdomen is nondistended, soft and nontender. Central nervous system: Lethargic, oriented to person and place only.  Extremities: left upper extremity swelling worse, with some tenderness. Left bka. Skin: No rashes,    Data Reviewed:  I have personally reviewed following labs and imaging studies   CBC Lab Results  Component Value Date   WBC 5.6 10/16/2023   RBC 3.06 (L) 10/16/2023   HGB 8.5 (L) 10/16/2023   HCT 28.6 (L) 10/16/2023   MCV 93.5 10/16/2023   MCH 27.8 10/16/2023   PLT 147 (L) 10/16/2023   MCHC 29.7 (L) 10/16/2023   RDW 15.8 (H) 10/16/2023   LYMPHSABS 1.1 09/27/2023   MONOABS 0.6 09/27/2023   EOSABS 0.6 (H) 09/27/2023   BASOSABS 0.0 09/27/2023     Last metabolic panel Lab Results  Component Value Date   NA 135 10/16/2023   K 6.2 (H) 10/16/2023   CL 94 (L) 10/16/2023   CO2 24 10/16/2023   BUN 52 (H) 10/16/2023   CREATININE 6.77 (H) 10/16/2023   GLUCOSE 281 (H) 10/16/2023   GFRNONAA 10 (L) 10/16/2023   GFRAA 7 (L) 05/30/2020   CALCIUM 9.3 10/16/2023   PHOS 8.3 (H) 10/16/2023   PROT 7.6 09/09/2023   ALBUMIN 3.1 (L) 10/08/2023   BILITOT 0.8 09/09/2023   ALKPHOS 116 09/09/2023   AST 15 09/09/2023   ALT 12 09/09/2023   ANIONGAP 17 (H) 10/16/2023    CBG (last 3)  Recent Labs    10/15/23 1709 10/15/23 2125 10/16/23 0718  GLUCAP 310* 381* 249*      Coagulation Profile: No results for input(s): "INR", "PROTIME" in the last 168 hours.   Radiology Studies: No results found.     Kathlen Mody M.D. [facility] 10/16/2023, 8:54 AM  Available via Epic secure chat 7am-7pm After 7 pm, please refer to night coverage provider listed on amion.

## 2023-10-16 NOTE — Progress Notes (Signed)
Pt tolerated treatment and goal met. Albumin 50 g iv given. Vancomycin 750 mg  iv given.during tx.  10/16/23 1930  Vitals  Temp 98.2 F (36.8 C)  Temp Source Oral  BP (!) 105/56  BP Location Right Arm  BP Method Automatic  Patient Position (if appropriate) Lying  Resp 18  Oxygen Therapy  O2 Device Nasal Cannula  O2 Flow Rate (L/min) 2 L/min  During Treatment Monitoring  Intra-Hemodialysis Comments Tx completed  Post Treatment  Dialyzer Clearance Clotted  Hemodialysis Intake (mL) 300 mL  Liters Processed 60  Fluid Removed (mL) 5000 mL  Tolerated HD Treatment Yes  Post-Hemodialysis Comments Pt goal met.  Hemodialysis Catheter Left Femoral vein Double lumen Permanent (Tunneled)  Placement Date/Time: 07/16/23 1318   Serial / Lot #: [id number]  Expiration Date: 05/24/26  Time Out: Correct patient;Correct site;Correct procedure  Maximum sterile barrier precautions: Hand hygiene;Cap;Mask;Sterile gown;Sterile gloves;Large sterile ...  Site Condition No complications  Blue Lumen Status Heparin locked  Red Lumen Status Heparin locked  Catheter fill solution Heparin 1000 units/ml  Catheter fill volume (Arterial) 2.3 cc  Catheter fill volume (Venous) 2.3  Dressing Type Transparent  Dressing Status Antimicrobial disc in place;Clean, Dry, Intact  Interventions New dressing  Drainage Description None  Dressing Change Due 10/18/23  Post treatment catheter status Capped and Clamped

## 2023-10-16 NOTE — Progress Notes (Signed)
[facility] Progress Note   Subjective:  Seen in room. For HD today. No CP/dyspnea.  Objective Vitals:   10/15/23 1711 10/15/23 2119 10/16/23 0449 10/16/23 0718  BP: 120/82 119/76 108/81 (!) 121/95  Pulse: (!) 114 (!) 40 (!) 116 (!) 117  Resp: 18 20 20 19   Temp:  98.2 F (36.8 C) (!) 97.5 F (36.4 C)   TempSrc:  Oral    SpO2: 92% 92% 92% 91%  Weight:      Height:       Physical Exam General: Chronically ill appearing man, NAD. Room air Heart: RRR; 2/6 murmur Lungs: CTA anteriorly Abdomen: soft Extremities: 1-2+ BLE and LUE edema Dialysis Access: L femoral TDC  Additional Objective Labs: Basic Metabolic Panel: Recent Labs  Lab 10/10/23 1106 10/14/23 1504 10/16/23 0411  NA 136 136 135  K 6.2* 5.9* 6.2*  CL 96* 94* 94*  CO2 23 26 24   GLUCOSE 188* 289* 281*  BUN 46* 62* 52*  CREATININE 5.88* 7.73* 6.77*  CALCIUM 9.3 9.1 9.3  PHOS  --  9.8* 8.3*   CBC: Recent Labs  Lab 10/14/23 1504 10/16/23 0411  WBC 5.7 5.6  HGB 8.5* 8.5*  HCT 29.6* 28.6*  MCV 96.4 93.5  PLT 156 147*   Medications:  albumin human 60 mL/hr at 10/12/23 0600   vancomycin      (feeding supplement) PROSource Plus  30 mL Oral BID BM   apixaban  5 mg Oral BID   bacitracin   Topical Daily   Chlorhexidine Gluconate Cloth  6 each Topical Q0600   darbepoetin (ARANESP) injection - DIALYSIS  100 mcg Subcutaneous Q Sun-1800   dextrose  1 Tube Oral Once   dextrose  25 mL Intravenous Once   gabapentin  100 mg Oral TID   Gerhardt's butt cream   Topical BID   influenza vac split trivalent PF  0.5 mL Intramuscular Tomorrow-1000   insulin aspart  0-5 Units Subcutaneous QHS   insulin aspart  0-6 Units Subcutaneous TID WC   insulin glargine-yfgn  6 Units Subcutaneous Daily   loratadine  10 mg Oral Daily   midodrine  10 mg Oral Q M,W,F-HD   pantoprazole  40 mg Oral Daily   povidone-Iodine   Topical Q0600   QUEtiapine  12.5 mg Oral q morning   QUEtiapine  25 mg Oral QHS    senna-docusate  2 tablet Oral BID   sevelamer carbonate  2,400 mg Oral TID WC   sodium zirconium cyclosilicate  10 g Oral TID    Dialysis Orders MWF - [facility] -> will be going to [place] TTS on discharge 4hr, 400/A1.5, EDW 77kg, L femoral TDC, 2K/3Ca bath, no heparin   Assessment/Plan: MRSA bacteremia with MV/TV endocarditis + brain abscess:  Not a candidate for valve surgery, S/p line holiday and brain abscess drainage (at [facility]). Continues on extended Vanc course - will be on Vanc IV q HD until 10/27/23. MRSA brain abscess: S/p transfer to [facility] and drainage 08/31/24 Acute B CVA/septic emboli Acute LUE DVT: On Eliquis ESRD: Transitioned to TTS schedule this week, HD today - lots of edema, max UF as tolerated. HTN/volume: BP stable, CT with diffuse anasarca. Significantly above EDW. Max UF with HD. Anemia of ESRD: Hgb 8.5, Aranesp ^ to 150 mcg q Sunday while here. No IV iron while on abx. Secondary HPTH: CorrCa/Phos high. Continue to hold calcitriol. Sevelamer increased to 3/meals. Hyperkalemia: Persistently high despite Lokelma 10g TID, using  1K bath with HD prn. Nutrition: Alb low, continue supplements.  T1DM: per primary Dispo: SNF being arranged - not finalized yet  Ozzie Hoyle, PA-C 10/16/2023, 9:00 AM  [employer]

## 2023-10-17 ENCOUNTER — Inpatient Hospital Stay (HOSPITAL_COMMUNITY): Payer: Medicare HMO

## 2023-10-17 DIAGNOSIS — G9341 Metabolic encephalopathy: Secondary | ICD-10-CM | POA: Diagnosis not present

## 2023-10-17 DIAGNOSIS — D631 Anemia in chronic kidney disease: Secondary | ICD-10-CM | POA: Diagnosis not present

## 2023-10-17 DIAGNOSIS — M7989 Other specified soft tissue disorders: Secondary | ICD-10-CM | POA: Diagnosis not present

## 2023-10-17 DIAGNOSIS — N250 Renal osteodystrophy: Secondary | ICD-10-CM | POA: Diagnosis not present

## 2023-10-17 DIAGNOSIS — I330 Acute and subacute infective endocarditis: Secondary | ICD-10-CM | POA: Diagnosis not present

## 2023-10-17 DIAGNOSIS — Z992 Dependence on renal dialysis: Secondary | ICD-10-CM | POA: Diagnosis not present

## 2023-10-17 DIAGNOSIS — R4182 Altered mental status, unspecified: Secondary | ICD-10-CM | POA: Diagnosis not present

## 2023-10-17 DIAGNOSIS — N186 End stage renal disease: Secondary | ICD-10-CM | POA: Diagnosis not present

## 2023-10-17 DIAGNOSIS — I38 Endocarditis, valve unspecified: Secondary | ICD-10-CM | POA: Diagnosis not present

## 2023-10-17 DIAGNOSIS — R4189 Other symptoms and signs involving cognitive functions and awareness: Secondary | ICD-10-CM | POA: Diagnosis not present

## 2023-10-17 DIAGNOSIS — I120 Hypertensive chronic kidney disease with stage 5 chronic kidney disease or end stage renal disease: Secondary | ICD-10-CM | POA: Diagnosis not present

## 2023-10-17 DIAGNOSIS — B9562 Methicillin resistant Staphylococcus aureus infection as the cause of diseases classified elsewhere: Secondary | ICD-10-CM | POA: Diagnosis not present

## 2023-10-17 DIAGNOSIS — R7881 Bacteremia: Secondary | ICD-10-CM | POA: Diagnosis not present

## 2023-10-17 LAB — CBC WITH DIFFERENTIAL/PLATELET
Abs Immature Granulocytes: 0.01 10*3/uL (ref 0.00–0.07)
Basophils Absolute: 0.1 10*3/uL (ref 0.0–0.1)
Basophils Relative: 1 %
Eosinophils Absolute: 0.7 10*3/uL — ABNORMAL HIGH (ref 0.0–0.5)
Eosinophils Relative: 12 %
HCT: 31.5 % — ABNORMAL LOW (ref 39.0–52.0)
Hemoglobin: 9.5 g/dL — ABNORMAL LOW (ref 13.0–17.0)
Immature Granulocytes: 0 %
Lymphocytes Relative: 20 %
Lymphs Abs: 1.2 10*3/uL (ref 0.7–4.0)
MCH: 27.7 pg (ref 26.0–34.0)
MCHC: 30.2 g/dL (ref 30.0–36.0)
MCV: 91.8 fL (ref 80.0–100.0)
Monocytes Absolute: 0.4 10*3/uL (ref 0.1–1.0)
Monocytes Relative: 7 %
Neutro Abs: 3.8 10*3/uL (ref 1.7–7.7)
Neutrophils Relative %: 60 %
Platelets: 148 10*3/uL — ABNORMAL LOW (ref 150–400)
RBC: 3.43 MIL/uL — ABNORMAL LOW (ref 4.22–5.81)
RDW: 15.9 % — ABNORMAL HIGH (ref 11.5–15.5)
WBC: 6.3 10*3/uL (ref 4.0–10.5)
nRBC: 0 % (ref 0.0–0.2)

## 2023-10-17 LAB — BASIC METABOLIC PANEL
Anion gap: 15 (ref 5–15)
BUN: 38 mg/dL — ABNORMAL HIGH (ref 6–20)
CO2: 25 mmol/L (ref 22–32)
Calcium: 9.7 mg/dL (ref 8.9–10.3)
Chloride: 96 mmol/L — ABNORMAL LOW (ref 98–111)
Creatinine, Ser: 5.72 mg/dL — ABNORMAL HIGH (ref 0.61–1.24)
GFR, Estimated: 12 mL/min — ABNORMAL LOW (ref >60)
Glucose, Bld: 137 mg/dL — ABNORMAL HIGH (ref 70–99)
Potassium: 4.9 mmol/L (ref 3.5–5.1)
Sodium: 136 mmol/L (ref 135–145)

## 2023-10-17 LAB — GLUCOSE, CAPILLARY
Glucose-Capillary: 194 mg/dL — ABNORMAL HIGH (ref 70–99)
Glucose-Capillary: 191 mg/dL — ABNORMAL HIGH (ref 70–99)
Glucose-Capillary: 198 mg/dL — ABNORMAL HIGH (ref 70–99)
Glucose-Capillary: 173 mg/dL — ABNORMAL HIGH (ref 70–99)

## 2023-10-17 NOTE — Progress Notes (Signed)
Pharmacy Antibiotic Note  [name] is a 43 y.o. 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 Pt was transferred to [facility] for MV repair 11/29 and craniotomy with abscess drainage on 12/4. Pharmacy has been consulted for continued Vancomycin dosing.  ESRD- HD changed from MWF to TTS schedule.  HD done yesterday 1/18.   Vancomycin level pre HD = 12 on Vancomycin 500mg  qHD-TTS on 1/16 This level was subtherapeutic, goal pre-HD level 15-25 mcg/ml. Increased Vancomycin dose.  Patient weight is 96.8 kg. Vancomycin end date is 10/28/23   Plan: Continue maintenance Vancomycin dose at 750 mg IV qHD- TTS thru 10/28/23. Will monitor HD tolerance and dosing, weekly pre HD Vanc level.    Height: 5\' 7"  (170.2 cm) Weight: 91.2 kg (201 lb 1 oz) IBW/kg (Calculated) : 66.1  Temp (24hrs), Avg:98.8 F (37.1 C), Min:98.2 F (36.8 C), Max:99.5 F (37.5 C)  Recent Labs  Lab 10/10/23 1106 10/14/23 1504 10/14/23 1817 10/16/23 0411  WBC  --  5.7  --  5.6  CREATININE 5.88* 7.73*  --  6.77*  VANCORANDOM  --   --  12  --     Estimated Creatinine Clearance: 15 mL/min (A) (by C-G formula based on SCr of 6.77 mg/dL (H)).    No Known Allergies  Antimicrobials this admission: Vancomycin 10/12 >>(10/28/23) Cefepime 10/21 >> 10/23; restart 10/25 >> 10/29 Linezolid 11/27 >> 12/10   Vancomycin levels  -10/17 preHD>>25 -10/24: VR= 16 -11/1: VR= 24 -11/8 VR 22 -11/15 VR 22  -11/22 VR 28 - held dose x1, decrease to 500mg  -11/28 VR 18 - cont 500mg  MWF 11/29 - Transferred to [facility] -12/11 VR 19.8 (at [facility]) - mostly 750mg  with HD (occasionally 500mg  instead) - 12/16 VR 25 mcg/mL- continue 750 mg iv MWF - 12/24 VR 27 mcg/mL- hold 1 dose / reduce to 500 mg iv MWF thereafter - 10/01/23 VR 15 - therapeutic  -09/1623 VR 12 - subtherapeutic on 500 qHD TTS> increase to 750mg  qHD TTS.   Microbiology results: 10/12 MRSA PCR >> positive 10/12 CDiff antigen +,  toxin -, PCR - >> negative 10/12 BCx >> 4/4 MRSA 10/13 BCx >> Negative 10/17 BCx >> Negative 10/21 RCx >> Negative 11/26 Bcx >> negative 11/27 Bcx >> Negative   Yanilen Adamik A. Jeanella Craze, PharmD, BCPS, [facility] Clinical Pharmacist [place] Please utilize Amion for appropriate phone number to reach the unit pharmacist [facility])  10/17/2023, 7:35 AM

## 2023-10-17 NOTE — Progress Notes (Signed)
VASCULAR LAB    Left upper extremity venous duplex has been performed.  See CV proc for preliminary results.   Jahan Friedlander, RVT 10/17/2023, 4:33 PM

## 2023-10-17 NOTE — Progress Notes (Signed)
[facility] Progress Note   Subjective:   Seen in room. Very hungry - awaiting breakfast tray, RN informed. Denies CP/dyspnea. S/p HD yesterday, 5L removed. Repeat MRI yesterday done d/t acute confusion - improved from prior.  Objective Vitals:   10/16/23 1939 10/16/23 2227 10/17/23 0512 10/17/23 0829  BP:  (!) 114/58 102/66 110/61  Pulse:  100 100 90  Resp:  18 18 18   Temp:  99.1 F (37.3 C) 99.5 F (37.5 C) (!) 97.3 F (36.3 C)  TempSrc:  Oral Oral Oral  SpO2:  100% 91% 100%  Weight: 91.2 kg     Height:       Physical Exam General: Chronically ill appearing man, NAD. Room air Heart: RRR; 2/6 murmur Lungs: CTA anteriorly Abdomen: soft Extremities: 1-2+ BLE and LUE edema Dialysis Access: L femoral [facility]  Additional Objective Labs: Basic Metabolic Panel: Recent Labs  Lab 10/10/23 1106 10/14/23 1504 10/16/23 0411 10/16/23 2119  NA 136 136 135  --   K 6.2* 5.9* 6.2* 4.4  CL 96* 94* 94*  --   CO2 23 26 24   --   GLUCOSE 188* 289* 281*  --   BUN 46* 62* 52*  --   CREATININE 5.88* 7.73* 6.77*  --   CALCIUM 9.3 9.1 9.3  --   PHOS  --  9.8* 8.3*  --    CBC: Recent Labs  Lab 10/14/23 1504 10/16/23 0411  WBC 5.7 5.6  HGB 8.5* 8.5*  HCT 29.6* 28.6*  MCV 96.4 93.5  PLT 156 147*   CBG: Recent Labs  Lab 10/15/23 2125 10/16/23 0718 10/16/23 1135 10/16/23 1652 10/16/23 2225  GLUCAP 381* 249* 310* 168* 160*   Studies/Results: MR BRAIN WO CONTRAST Result Date: 10/17/2023 CLINICAL DATA:  Altered mental status EXAM: MRI HEAD WITHOUT CONTRAST TECHNIQUE: Multiplanar, multiecho pulse sequences of the brain and surrounding structures were obtained without intravenous contrast. COMPARISON:  08/23/2023 FINDINGS: Brain: Decreased size of area of abnormal diffusion restriction within the posterior right frontal operculum. The amount of surrounding edema has also decreased. Chronic microhemorrhage in the left cerebellum. Chronic blood products at the right  frontal cavity, status post transcranial intervention. The midline structures are normal. Vascular: Normal flow voids. Skull and upper cervical spine: Right parietal craniotomy. Sinuses/Orbits:Bilateral mastoid effusions. Paranasal sinuses are clear. Normal orbits. IMPRESSION: 1. Decreased size of area of abnormal diffusion restriction within the posterior right frontal operculum compatible with reported abscess. The amount of surrounding edema has also decreased. 2. Chronic blood products at the right frontal cavity, status post transcranial intervention. Electronically Signed   By: Deatra Robinson M.D.   On: 10/17/2023 01:29   Medications:  albumin human 25 g (10/16/23 1703)   vancomycin Stopped (10/16/23 1918)    (feeding supplement) PROSource Plus  30 mL Oral BID BM   apixaban  5 mg Oral BID   bacitracin   Topical Daily   Chlorhexidine Gluconate Cloth  6 each Topical Q0600   darbepoetin (ARANESP) injection - DIALYSIS  100 mcg Subcutaneous Q Sun-1800   dextrose  1 Tube Oral Once   dextrose  25 mL Intravenous Once   gabapentin  100 mg Oral TID   Gerhardt's butt cream   Topical BID   influenza vac split trivalent PF  0.5 mL Intramuscular Tomorrow-1000   insulin aspart  0-5 Units Subcutaneous QHS   insulin aspart  0-6 Units Subcutaneous TID WC   insulin glargine-yfgn  8 Units Subcutaneous Daily   loratadine  10 mg  Oral Daily   midodrine  10 mg Oral Q M,W,F-HD   pantoprazole  40 mg Oral Daily   povidone-Iodine   Topical Q0600   QUEtiapine  12.5 mg Oral q morning   QUEtiapine  25 mg Oral QHS   senna-docusate  2 tablet Oral BID   sevelamer carbonate  2,400 mg Oral TID WC   sodium zirconium cyclosilicate  10 g Oral TID    Dialysis Orders MWF - [facility] -> will be going to [place] [facility] on discharge 4hr, 400/A1.5, EDW 77kg, L femoral TDC, 2K/3Ca bath, no heparin   Assessment/Plan: MRSA bacteremia with MV/TV endocarditis + brain abscess:  Not a candidate for valve surgery, S/p line holiday and brain  abscess drainage (at [facility]). Continues on extended Vanc course - will be on Vanc IV q HD until 10/27/23. MRSA brain abscess: S/p transfer to [facility] and drainage 08/31/24. Repeat brain MRI 10/17/23 showed improvement. Acute B CVA/septic emboli Acute LUE DVT: On Eliquis ESRD: Transitioned to TTS, next HD 1/21.  HTN/volume: BP stable, CT with diffuse anasarca. Significantly above EDW. Max UF with HD. Anemia of ESRD: Hgb 8.5, Aranesp ^ to 150 mcg q Sunday while here. No IV iron while on abx. Secondary HPTH: CorrCa/Phos high. Continue to hold calcitriol. Sevelamer increased to 3/meals. Hyperkalemia: Persistently high despite Lokelma 10g TID, using 1K bath occ with HD. Nutrition: Alb low, continue supplements.  T1DM: per primary Dispo: SNF being arranged - not finalized yet     Ozzie Hoyle, PA-C 10/17/2023, 8:56 AM   [facility]

## 2023-10-17 NOTE — Progress Notes (Signed)
[facility]                                                                               [name], is a 43 y.o. male, DOB - [birth year], ZOX:[id number] Admit date - 09/09/2023    Outpatient Primary MD for the patient is [name], NP  LOS - 38  days    Brief summary   [name] is a 43 y.o. male with medical history significant of ESRD on hemodialysis (not fully compliant), insulin-dependent type 1 diabetes, gastroparesis, Left AKA, admitted to [facility] on 07/09/23 for DKA, Sepsis and hospital course significant for MV AND TV  MRSA endocarditis, followed by acute MCA stroke , left occipital and bilateral cerebellar strokes consistent with septic emboli,  Brain abscess from MRSA, left IJ DVT was transferred to [facility] for MV repair on 11/29.  While at [facility] he underwent cardiac cath showing non obstructive disease, craniotomy with abscess drainage on 09/01/23,. He had subarachnoid hemorrhage while on IV heparin, right third toe distal necrosis,was seen by podiatry, but family wanted conservative management. Since his tertiary care needs have been completed, he was transferred back to [facility] for further management.  PTOT  recommending SNF and awaiting placement.   Assessment & Plan    Assessment and Plan:   MRSA TV/MV endocarditis 10/12, blood cultures positive for MRSA. 10/13, TTE showed LVEF of 25-30% with concern for AV/MV abnormality  10/16 and 10/22, TEE showed MV/TV vegetations.  Currently on IV vancomycin.  Per ID recommendation, to complete 8 weeks of treatment EOT 1/29. Recommend outpatient follow up with ID.    Hypotension Resolved.  On Midodrine.     ESRD on HD Hyperkalemia resolved.  Nephrology on board.     Acute CVA. / ACUTE metabolic encephalopathy.  Mri brain on 10/17 showed large right MCA infarct.  11/25, MRA head showed significant for large necrotic appearing lesion within the right frontal lobe within area of the prior right MCA  territory infarct with vasogenic edema and 8 mm leftward midline shift.  Neurology recommendations for Eliquis.  PT/OT recommendations for SNF. Repeat MRI brain done for increased confusion. Shows decreased size of area of abnormal diffusion restriction within the posterior right frontal operculum compatible with reported abscess. The amount of surrounding edema has also decreased  Cerebral emboli with abscess/ Cerebritis Sec to MRSA Endocarditis.  S/p craniotomy and abscess drainage.    Type 1 dm uncontrolled with hyperglycemia.  CBG (last 3)  Recent Labs    10/17/23 0836 10/17/23 1149 10/17/23 1552  GLUCAP 194* 191* 198*   Resume SSI. Increase Semglee to 8 units daily.  No changes in meds.    Acute left upper extremity DVT Resume Eliquis.  Left upper extremity more swollen and  tender.  Venous duplex of the left upper extremity ordered.    Anemia of chronic kidney disease H&H stable.    Right third toe necrosis: H/o prior left BKA: Per documentation, patient/family opted for conservative management and declined surgical management. Patient was seen by PT/OT with recommendation for SNF on discharge.  Prostate fluid collection Urology evaluated patient at [facility], suggested that its a dilated seminal vesicle without  any surgical intervention.     Mild cognitive dysfunction with behavioral changes.  Currently stable on seroquel.      Estimated body mass index is 31.49 kg/m as calculated from the following:   Height as of this encounter: 5\' 7"  (1.702 m).   Weight as of this encounter: 91.2 kg.  Code Status: full code.  DVT Prophylaxis:   apixaban (ELIQUIS) tablet 5 mg   Level of Care: Level of care: Telemetry Medical Family Communication: none at bedside.   Disposition Plan:     Remains inpatient appropriate:  awaiting safe disposition.   Procedures:  None.   Consultants:   Nephrology.   Antimicrobials:   Anti-infectives (From admission, onward)     Start     Dose/Rate Route Frequency Ordered Stop   10/16/23 1345  vancomycin (VANCOCIN) 750 mg in sodium chloride 0.9 % 250 mL IVPB        750 mg 265 mL/hr over 60 Minutes Intravenous Every T-Th-Sa (Hemodialysis) 10/16/23 1257 10/28/23 1159   10/16/23 1200  vancomycin (VANCOREADY) IVPB 750 mg/150 mL  Status:  Discontinued        750 mg 150 mL/hr over 60 Minutes Intravenous Every T-Th-Sa (Hemodialysis) 10/14/23 1843 10/16/23 1257   10/14/23 1845  vancomycin (VANCOREADY) IVPB 500 mg/100 mL        500 mg 100 mL/hr over 60 Minutes Intravenous  Once 10/14/23 1843 10/15/23 0806   10/14/23 1200  vancomycin (VANCOREADY) IVPB 500 mg/100 mL  Status:  Discontinued        500 mg 100 mL/hr over 60 Minutes Intravenous Every T-Th-Sa (Hemodialysis) 10/13/23 1309 10/14/23 1843   10/13/23 1200  vancomycin (VANCOREADY) IVPB 500 mg/100 mL  Status:  Discontinued        500 mg 100 mL/hr over 60 Minutes Intravenous Every M-W-F (Hemodialysis) 10/11/23 1517 10/13/23 1309   10/12/23 1200  vancomycin (VANCOREADY) IVPB 500 mg/100 mL  Status:  Discontinued        500 mg 100 mL/hr over 60 Minutes Intravenous Every T-Th-Sa (Hemodialysis) 10/11/23 1514 10/11/23 1517   09/24/23 1200  vancomycin (VANCOREADY) IVPB 500 mg/100 mL  Status:  Discontinued        500 mg 100 mL/hr over 60 Minutes Intravenous Every M-W-F (Hemodialysis) 09/21/23 1246 10/11/23 1514   09/20/23 1200  vancomycin (VANCOCIN) 750 mg in sodium chloride 0.9 % 250 mL IVPB  Status:  Discontinued        750 mg 265 mL/hr over 60 Minutes Intravenous Every M-W-F (Hemodialysis) 09/20/23 0446 09/21/23 1246   09/20/23 1045  vancomycin (VANCOCIN) 750 mg in sodium chloride 0.9 % 250 mL IVPB        750 mg 265 mL/hr over 60 Minutes Intravenous  Once 09/20/23 0959 09/20/23 1226   09/20/23 0945  vancomycin (VANCOREADY) IVPB 750 mg/150 mL  Status:  Discontinued        750 mg 150 mL/hr over 60 Minutes Intravenous  Once 09/20/23 0855 09/20/23 0959   09/10/23 1200   vancomycin (VANCOREADY) IVPB 750 mg/150 mL  Status:  Discontinued        750 mg 150 mL/hr over 60 Minutes Intravenous Every M-W-F (Hemodialysis) 09/09/23 2030 09/20/23 0446   09/09/23 2200  linezolid (ZYVOX) tablet 600 mg  Status:  Discontinued        600 mg Oral Every 12 hours 09/09/23 2035 09/10/23 0946        Medications  Scheduled Meds:  (feeding supplement) PROSource Plus  30 mL Oral BID BM  apixaban  5 mg Oral BID   bacitracin   Topical Daily   Chlorhexidine Gluconate Cloth  6 each Topical Q0600   darbepoetin (ARANESP) injection - DIALYSIS  100 mcg Subcutaneous Q Sun-1800   dextrose  1 Tube Oral Once   dextrose  25 mL Intravenous Once   gabapentin  100 mg Oral TID   Gerhardt's butt cream   Topical BID   influenza vac split trivalent PF  0.5 mL Intramuscular Tomorrow-1000   insulin aspart  0-5 Units Subcutaneous QHS   insulin aspart  0-6 Units Subcutaneous TID WC   insulin glargine-yfgn  8 Units Subcutaneous Daily   loratadine  10 mg Oral Daily   midodrine  10 mg Oral Q M,W,F-HD   pantoprazole  40 mg Oral Daily   povidone-Iodine   Topical Q0600   QUEtiapine  12.5 mg Oral q morning   QUEtiapine  25 mg Oral QHS   senna-docusate  2 tablet Oral BID   sevelamer carbonate  2,400 mg Oral TID WC   sodium zirconium cyclosilicate  10 g Oral TID   Continuous Infusions:  albumin human 25 g (10/16/23 1703)   vancomycin Stopped (10/16/23 1918)   PRN Meds:.acetaminophen, albumin human, clonazepam, diphenhydrAMINE, diphenhydrAMINE-zinc acetate, Gerhardt's butt cream, hydrALAZINE, HYDROmorphone (DILAUDID) injection, loperamide, metoprolol tartrate, ondansetron (ZOFRAN) IV, mouth rinse, oxyCODONE, polyethylene glycol    Subjective:   Tramar Losano was seen and examined today.  Pt is more awake , continues to have worsening pain in the left upper extremity.   Objective:   Vitals:   10/16/23 1939 10/16/23 2227 10/17/23 0512 10/17/23 0829  BP:  (!) 114/58 102/66 110/61  Pulse:   100 100 90  Resp:  18 18 18   Temp:  99.1 F (37.3 C) 99.5 F (37.5 C) (!) 97.3 F (36.3 C)  TempSrc:  Oral Oral Oral  SpO2:  100% 91% 100%  Weight: 91.2 kg     Height:        Intake/Output Summary (Last 24 hours) at 10/17/2023 1707 Last data filed at 10/17/2023 0300 Gross per 24 hour  Intake 1 ml  Output 5000 ml  Net -4999 ml   Filed Weights   10/14/23 1436 10/16/23 1626 10/16/23 1939  Weight: 96.8 kg 96 kg 91.2 kg     Exam General exam: Appears calm and comfortable  Respiratory system: Clear to auscultation. Respiratory effort normal. Cardiovascular system: S1 & S2 heard, RRR.  Gastrointestinal system: Abdomen is nondistended, soft and nontender.  Central nervous system: Alert and oriented to place and person. Facial droop  Extremities: left upper extremity swollen and tender.  Skin: No rashes,  Psychiatry: mood is appropriate.   Data Reviewed:  I have personally reviewed following labs and imaging studies   CBC Lab Results  Component Value Date   WBC 6.3 10/17/2023   RBC 3.43 (L) 10/17/2023   HGB 9.5 (L) 10/17/2023   HCT 31.5 (L) 10/17/2023   MCV 91.8 10/17/2023   MCH 27.7 10/17/2023   PLT 148 (L) 10/17/2023   MCHC 30.2 10/17/2023   RDW 15.9 (H) 10/17/2023   LYMPHSABS 1.2 10/17/2023   MONOABS 0.4 10/17/2023   EOSABS 0.7 (H) 10/17/2023   BASOSABS 0.1 10/17/2023     Last metabolic panel Lab Results  Component Value Date   NA 136 10/17/2023   K 4.9 10/17/2023   CL 96 (L) 10/17/2023   CO2 25 10/17/2023   BUN 38 (H) 10/17/2023   CREATININE 5.72 (H) 10/17/2023   GLUCOSE 137 (  H) 10/17/2023   GFRNONAA 12 (L) 10/17/2023   GFRAA 7 (L) 05/30/2020   CALCIUM 9.7 10/17/2023   PHOS 8.3 (H) 10/16/2023   PROT 7.6 09/09/2023   ALBUMIN 3.1 (L) 10/08/2023   BILITOT 0.8 09/09/2023   ALKPHOS 116 09/09/2023   AST 15 09/09/2023   ALT 12 09/09/2023   ANIONGAP 15 10/17/2023    CBG (last 3)  Recent Labs    10/17/23 0836 10/17/23 1149 10/17/23 1552  GLUCAP  194* 191* 198*      Coagulation Profile: No results for input(s): "INR", "PROTIME" in the last 168 hours.   Radiology Studies: MR BRAIN WO CONTRAST Result Date: 10/17/2023 CLINICAL DATA:  Altered mental status EXAM: MRI HEAD WITHOUT CONTRAST TECHNIQUE: Multiplanar, multiecho pulse sequences of the brain and surrounding structures were obtained without intravenous contrast. COMPARISON:  08/23/2023 FINDINGS: Brain: Decreased size of area of abnormal diffusion restriction within the posterior right frontal operculum. The amount of surrounding edema has also decreased. Chronic microhemorrhage in the left cerebellum. Chronic blood products at the right frontal cavity, status post transcranial intervention. The midline structures are normal. Vascular: Normal flow voids. Skull and upper cervical spine: Right parietal craniotomy. Sinuses/Orbits:Bilateral mastoid effusions. Paranasal sinuses are clear. Normal orbits. IMPRESSION: 1. Decreased size of area of abnormal diffusion restriction within the posterior right frontal operculum compatible with reported abscess. The amount of surrounding edema has also decreased. 2. Chronic blood products at the right frontal cavity, status post transcranial intervention. Electronically Signed   By: Deatra Robinson M.D.   On: 10/17/2023 01:29       Kathlen Mody M.D. [facility] 10/17/2023, 5:07 PM  Available via Epic secure chat 7am-7pm After 7 pm, please refer to night coverage provider listed on amion.

## 2023-10-17 NOTE — Plan of Care (Signed)

## 2023-10-18 ENCOUNTER — Inpatient Hospital Stay: Payer: Medicare HMO | Admitting: Adult Health

## 2023-10-18 DIAGNOSIS — D631 Anemia in chronic kidney disease: Secondary | ICD-10-CM | POA: Diagnosis not present

## 2023-10-18 DIAGNOSIS — Z992 Dependence on renal dialysis: Secondary | ICD-10-CM | POA: Diagnosis not present

## 2023-10-18 DIAGNOSIS — I330 Acute and subacute infective endocarditis: Secondary | ICD-10-CM | POA: Diagnosis not present

## 2023-10-18 DIAGNOSIS — R7881 Bacteremia: Secondary | ICD-10-CM | POA: Diagnosis not present

## 2023-10-18 DIAGNOSIS — N250 Renal osteodystrophy: Secondary | ICD-10-CM | POA: Diagnosis not present

## 2023-10-18 DIAGNOSIS — I120 Hypertensive chronic kidney disease with stage 5 chronic kidney disease or end stage renal disease: Secondary | ICD-10-CM | POA: Diagnosis not present

## 2023-10-18 DIAGNOSIS — I38 Endocarditis, valve unspecified: Secondary | ICD-10-CM | POA: Diagnosis not present

## 2023-10-18 DIAGNOSIS — B9562 Methicillin resistant Staphylococcus aureus infection as the cause of diseases classified elsewhere: Secondary | ICD-10-CM | POA: Diagnosis not present

## 2023-10-18 DIAGNOSIS — N186 End stage renal disease: Secondary | ICD-10-CM | POA: Diagnosis not present

## 2023-10-18 LAB — GLUCOSE, CAPILLARY
Glucose-Capillary: 147 mg/dL — ABNORMAL HIGH (ref 70–99)
Glucose-Capillary: 186 mg/dL — ABNORMAL HIGH (ref 70–99)
Glucose-Capillary: 258 mg/dL — ABNORMAL HIGH (ref 70–99)
Glucose-Capillary: 231 mg/dL — ABNORMAL HIGH (ref 70–99)

## 2023-10-18 NOTE — TOC Progression Note (Signed)
Transition of Care [facility]) - Progression Note    Patient Details  Name: [name] MRN: [id number] Date of Birth: 03-16-1981  [facility] CM/SW Contact  Mia Milan A [place], [place] Phone Number: 10/18/2023, 3:49 PM  Clinical Narrative:     CSW spoke with Wille Celeste at [facility], she stated that if pt does not have any more behavior issues then they could possibly extend bed offer. CSW notified nursing regarding necessary documentation. CSW to follow up again tomorrow, authorization needed before discharge. CSW will start authorization once confirmed pt can go to facility.    TOC will continue to follow.   Expected Discharge Plan: Skilled Nursing Facility Barriers to Discharge: Continued Medical Work up  Expected Discharge Plan and Services                                               Social Determinants of Health (SDOH) Interventions SDOH Screenings   Food Insecurity: No Food Insecurity (09/09/2023)  Housing: Unknown (09/09/2023)  Transportation Needs: No Transportation Needs (09/09/2023)  Utilities: Not At Risk (09/09/2023)  Alcohol Screen: Low Risk  (02/15/2023)  Depression (PHQ2-9): Low Risk  (02/15/2023)  Financial Resource Strain: Low Risk  (02/15/2023)  Physical Activity: Inactive (02/15/2023)  Social Connections: Socially Integrated (02/15/2023)  Stress: No Stress Concern Present (02/15/2023)  Tobacco Use: Low Risk  (09/12/2023)  Recent Concern: Tobacco Use - Medium Risk (09/01/2023)   Received from [facility]    Readmission Risk Interventions     No data to display

## 2023-10-18 NOTE — Plan of Care (Signed)
  Problem: Education: Goal: Ability to describe self-care measures that may prevent or decrease complications (Diabetes Survival Skills Education) will improve Outcome: Progressing Goal: Individualized Educational Video(s) Outcome: Progressing   Problem: Coping: Goal: Ability to adjust to condition or change in health will improve Outcome: Progressing   Problem: Fluid Volume: Goal: Ability to maintain a balanced intake and output will improve Outcome: Progressing   Problem: Health Behavior/Discharge Planning: Goal: Ability to identify and utilize available resources and services will improve Outcome: Progressing Goal: Ability to manage health-related needs will improve Outcome: Progressing   Problem: Metabolic: Goal: Ability to maintain appropriate glucose levels will improve Outcome: Progressing   Problem: Nutritional: Goal: Maintenance of adequate nutrition will improve Outcome: Progressing Goal: Progress toward achieving an optimal weight will improve Outcome: Progressing   Problem: Skin Integrity: Goal: Risk for impaired skin integrity will decrease Outcome: Progressing   Problem: Tissue Perfusion: Goal: Adequacy of tissue perfusion will improve Outcome: Progressing   Problem: Education: Goal: Knowledge of General Education information will improve Description: Including pain rating scale, medication(s)/side effects and non-pharmacologic comfort measures Outcome: Progressing   Problem: Health Behavior/Discharge Planning: Goal: Ability to manage health-related needs will improve Outcome: Progressing   Problem: Clinical Measurements: Goal: Ability to maintain clinical measurements within normal limits will improve Outcome: Progressing Goal: Will remain free from infection Outcome: Progressing Goal: Diagnostic test results will improve Outcome: Progressing Goal: Respiratory complications will improve Outcome: Progressing Goal: Cardiovascular complication will  be avoided Outcome: Progressing   Problem: Activity: Goal: Risk for activity intolerance will decrease Outcome: Progressing   Problem: Nutrition: Goal: Adequate nutrition will be maintained Outcome: Progressing   Problem: Pain Management: Goal: General experience of comfort will improve Outcome: Progressing   Problem: Safety: Goal: Ability to remain free from injury will improve Outcome: Progressing   Problem: Skin Integrity: Goal: Risk for impaired skin integrity will decrease Outcome: Progressing

## 2023-10-18 NOTE — Plan of Care (Signed)
  Problem: Clinical Measurements: Goal: Will remain free from infection Outcome: Progressing   Problem: Skin Integrity: Goal: Risk for impaired skin integrity will decrease Outcome: Progressing   Problem: Tissue Perfusion: Goal: Adequacy of tissue perfusion will improve Outcome: Progressing   Problem: Nutritional: Goal: Ability to make healthy dietary choices will improve Outcome: Progressing   Problem: Clinical Measurements: Goal: Complications related to the disease process, condition or treatment will be avoided or minimized Outcome: Progressing

## 2023-10-18 NOTE — Progress Notes (Signed)
Physical Therapy Treatment Patient Details Name: [name] MRN: [id number] DOB: 03/27/1981 Today's Date: 10/18/2023   History of Present Illness 43 y.o. male presents to [facility] 09/09/23 as a transfer from [facility] for further management of endocarditis. Pt originally was admitted to [facility] 07/09/23 for DKA and sepsis after being found on the ground. 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 10/17 pt had a R MCA, L occipital, and B cerebellum CVA w/ septic emboli. ICU admit 10/18 w/ intubation 10/18-10/24. Pt was transferred to [facility] for MV repair on 11/29. At [facility], pt had cardiac cath w/ non obstructive disease, SAH, a craniotomy w/ abscess drainage on 12/4, and R third toe distal necrosis. PMH: chronic L AKA, MRSA, IDDM, ESRD on HD MWF, HTN, TEE positive for vegetation on multiple valves, cellulitis, medical noncompliance, narcotic dependence, DM with gastroparesis, L internal jugular DVT.    PT Comments  Pt reports it is too early in the morning to work with therapy. Therapist explains that help for treatment is only available now. Pt reluctantly agrees to sit up on EoB for breakfast. PT brought progressive care bed pad to be able to facilitate easier scooting in bed with use of bed pad. Pt rolls to L with min A and to R with max A for placement of pad. Once pad placed and instruction give to move to EoB for breakfast pt reports he does not want to eat on EoB and request HoB elevated so pt could eat in bed. Despite increased encouragement pt refuses. Pt set up for breakfast. Pt goals reviewed and updated. PT will continue to follow acutely.     If plan is discharge home, recommend the following: Two people to help with walking and/or transfers;Assist for transportation;Supervision due to cognitive status;Help with stairs or ramp for entrance;Two people to help with bathing/dressing/bathroom;Assistance with cooking/housework;Direct supervision/assist for financial  management;Direct supervision/assist for medications management   Can travel by private vehicle     No  Equipment Recommendations  Hoyer lift;Wheelchair (measurements PT);Wheelchair cushion (measurements PT);Other (comment)       Precautions / Restrictions Precautions Precautions: Fall;Other (comment) Precaution Comments: Contact precs; L AKA, L hemiparesis and edema, L neglect, bowel incontinence. L femoral HD cath Restrictions Weight Bearing Restrictions Per Provider Order: No LLE Weight Bearing Per Provider Order: Non weight bearing Other Position/Activity Restrictions: Pt has sling for LUE when mobilizing OOB; LLE prosthetic not fitting     Mobility  Bed Mobility Overal bed mobility: Needs Assistance Bed Mobility: Rolling, Supine to Sit, Sit to Supine Rolling: Max assist, Used rails, Min assist   Supine to sit: Max assist, +2 for safety/equipment, Used rails, HOB elevated Sit to supine: Max assist, +2 for physical assistance, +2 for safety/equipment   General bed mobility comments: able to rpoll to L with minA and pt use of rail, requires maxA for rolling R, reduced friction bed pad placed for increased ease of bed mobility with assistance of bed pad for mobization, with rail up on R pt able to put foot in bed rail and reach overhead with R arm to assist in pulling up in bed, with pt help and new bed pad scooting pt up in bed is min Ax 2, pt refuses any further mobilization          Balance       Sitting balance - Comments: pt refuses today  Cognition Arousal: Alert Behavior During Therapy: Flat affect Overall Cognitive Status: Impaired/Different from baseline                     Current Attention Level: Selective Memory: Decreased short-term memory Following Commands: Follows one step commands consistently, Follows multi-step commands inconsistently, Follows one step commands with increased time    Awareness: Emergent Problem Solving: Difficulty sequencing, Requires verbal cues, Slow processing, Decreased initiation General Comments: unhappy that PT  arrived at 8:30 reporting that he had not eaten breakfast yet. reluctantly agreeable to bed mobility to be able to sit up for breakfast,           General Comments General comments (skin integrity, edema, etc.): VSS on RA      Pertinent Vitals/Pain Pain Assessment Pain Assessment: Faces Faces Pain Scale: Hurts a little bit Pain Location: buttocks Pain Descriptors / Indicators: Sore, Discomfort Pain Intervention(s): Limited activity within patient's tolerance, Monitored during session, Repositioned    Home Living                          Prior Function            PT Goals (current goals can now be found in the care plan section) Acute Rehab PT Goals Patient Stated Goal: rehab at SNF PT Goal Formulation: With patient Time For Goal Achievement: 10/26/23 Potential to Achieve Goals: Fair Progress towards PT goals: Not progressing toward goals - comment    Frequency    Min 1X/week       AM-PAC PT "6 Clicks" Mobility   Outcome Measure  Help needed turning from your back to your side while in a flat bed without using bedrails?: A Lot Help needed moving from lying on your back to sitting on the side of a flat bed without using bedrails?: Total Help needed moving to and from a bed to a chair (including a wheelchair)?: Total Help needed standing up from a chair using your arms (e.g., wheelchair or bedside chair)?: Total Help needed to walk in hospital room?: Total Help needed climbing 3-5 steps with a railing? : Total 6 Click Score: 7    End of Session   Activity Tolerance: Patient tolerated treatment well;Other (comment) (pt limiting treatment due to early in morning and had not eaten breakfast) Patient left: in bed;with call bell/phone within reach;with bed alarm set Nurse Communication: Mobility  status;Need for lift equipment PT Visit Diagnosis: Other abnormalities of gait and mobility (R26.89);Other symptoms and signs involving the nervous system (R29.898);Hemiplegia and hemiparesis Hemiplegia - Right/Left: Left Hemiplegia - dominant/non-dominant: Non-dominant Hemiplegia - caused by: Cerebral infarction     Time: 1610-9604 PT Time Calculation (min) (ACUTE ONLY): 11 min  Charges:    $Therapeutic Activity: 8-22 mins PT General Charges $$ ACUTE PT VISIT: 1 Visit                     Raykwon Hobbs B. Beverely Risen PT, DPT Acute Rehabilitation Services Please use secure chat or  Call Office [phone]    Elon Alas [facility] 10/18/2023, 9:55 AM

## 2023-10-18 NOTE — Progress Notes (Signed)
PROGRESS NOTE    [name]  ZOX:[id number] DOB: [birth year] DOA: 09/09/2023 PCP: Shane Sessions, NP    Brief Narrative:  This 43 y.o. male with medical history significant of ESRD on hemodialysis (not fully compliant), insulin-dependent type 1 diabetes, gastroparesis, Left AKA, admitted to [facility] on 07/09/23 for DKA, Sepsis and hospital course significant for MV AND TV  MRSA endocarditis, followed by acute MCA stroke , left occipital and bilateral cerebellar strokes consistent with septic emboli,  Brain abscess from MRSA, left IJ DVT was transferred to [facility] for MV repair on 11/29.  While at [facility] he underwent cardiac cath showing non obstructive disease, craniotomy with abscess drainage on 09/01/23,. He had subarachnoid hemorrhage while on IV heparin, right third toe distal necrosis,was seen by podiatry, but family wanted conservative management. Since his tertiary care needs have been completed, he was transferred back to [facility] for further management.  PTOT  recommending SNF and awaiting placement.   Assessment & Plan:   Principal Problem:   Endocarditis Active Problems:   MRSA bacteremia   Endocarditis of tricuspid valve   ESRD on dialysis [facility])   Cognitive impairment   Insulin dependent type 1 diabetes mellitus ([name])   Diabetic gastroparesis ([name])   Diabetic foot infection ([name])   Endocarditis of mitral valve   Cerebrovascular accident (CVA) due to embolism of cerebral artery ([name])  MRSA TV/MV endocarditis: 10/12, blood cultures positive for MRSA. 10/13, TTE showed LVEF of 25-30% with concern for AV/MV abnormality  10/16 and 10/22, TEE showed MV/TV vegetations.  Currently on IV vancomycin.  Per ID recommendation, to complete 8 weeks of treatment EOT 1/29. Recommend outpatient follow up with ID.    Hypotension: Resolved.  Continue Midodrine.    ESRD on HD: Hyperkalemia resolved.  Nephrology on board.  Continue hemodialysis as per schedule.  Acute CVA. / ACUTE  metabolic encephalopathy: Mri brain on 10/17 showed large right MCA infarct.  11/25, MRA head showed significant for large necrotic appearing lesion within the right frontal lobe within area of the prior right MCA territory infarct with vasogenic edema and 8 mm leftward midline shift.  Neurology recommendations for Eliquis.  PT/OT recommendations for SNF. Repeat MRI brain done for increased confusion. Shows decreased size of area of abnormal diffusion restriction within the posterior right frontal operculum compatible with reported abscess. The amount of surrounding edema has also decreased   Cerebral emboli with abscess/ Cerebritis Sec to MRSA Endocarditis.  S/p craniotomy and abscess drainage.    Type 1 DM uncontrolled with hyperglycemia.  Resume SSI. Continue Semglee to 8 units daily.  No changes in meds.    Acute left upper extremity DVT Continue Eliquis.  Left upper extremity more swollen and  tender.  Left upper extremity venous duplex shows age-indeterminate DVT involving the left axillary vein.   Anemia of chronic kidney disease: H&H stable.    Right third toe necrosis: H/o prior left BKA: Per documentation, patient/family opted for conservative management and declined surgical management. Patient was seen by PT/OT with recommendation for SNF on discharge.   Prostate fluid collection Urology evaluated patient at [facility], suggested that its a dilated seminal vesicle without any surgical intervention.    Mild cognitive dysfunction with behavioral changes.  Currently stable on seroquel.          Estimated body mass index is 31.49 kg/m as calculated from the following:   Height as of this encounter: 5\' 7"  (1.702 m).   Weight as of this encounter: 91.2 kg.  DVT prophylaxis: Eliquis Code Status: Full code Family Communication: No family at bed side Disposition Plan:     Status is: Inpatient Remains inpatient appropriate because: Awaiting safe disposition.      Consultants:  Nephrology  Procedures: None  Antimicrobials:  Anti-infectives (From admission, onward)    Start     Dose/Rate Route Frequency Ordered Stop   10/16/23 1345  vancomycin (VANCOCIN) 750 mg in sodium chloride 0.9 % 250 mL IVPB        750 mg 265 mL/hr over 60 Minutes Intravenous Every T-Th-Sa (Hemodialysis) 10/16/23 1257 10/28/23 1159   10/16/23 1200  vancomycin (VANCOREADY) IVPB 750 mg/150 mL  Status:  Discontinued        750 mg 150 mL/hr over 60 Minutes Intravenous Every T-Th-Sa (Hemodialysis) 10/14/23 1843 10/16/23 1257   10/14/23 1845  vancomycin (VANCOREADY) IVPB 500 mg/100 mL        500 mg 100 mL/hr over 60 Minutes Intravenous  Once 10/14/23 1843 10/15/23 0806   10/14/23 1200  vancomycin (VANCOREADY) IVPB 500 mg/100 mL  Status:  Discontinued        500 mg 100 mL/hr over 60 Minutes Intravenous Every T-Th-Sa (Hemodialysis) 10/13/23 1309 10/14/23 1843   10/13/23 1200  vancomycin (VANCOREADY) IVPB 500 mg/100 mL  Status:  Discontinued        500 mg 100 mL/hr over 60 Minutes Intravenous Every M-W-F (Hemodialysis) 10/11/23 1517 10/13/23 1309   10/12/23 1200  vancomycin (VANCOREADY) IVPB 500 mg/100 mL  Status:  Discontinued        500 mg 100 mL/hr over 60 Minutes Intravenous Every T-Th-Sa (Hemodialysis) 10/11/23 1514 10/11/23 1517   09/24/23 1200  vancomycin (VANCOREADY) IVPB 500 mg/100 mL  Status:  Discontinued        500 mg 100 mL/hr over 60 Minutes Intravenous Every M-W-F (Hemodialysis) 09/21/23 1246 10/11/23 1514   09/20/23 1200  vancomycin (VANCOCIN) 750 mg in sodium chloride 0.9 % 250 mL IVPB  Status:  Discontinued        750 mg 265 mL/hr over 60 Minutes Intravenous Every M-W-F (Hemodialysis) 09/20/23 0446 09/21/23 1246   09/20/23 1045  vancomycin (VANCOCIN) 750 mg in sodium chloride 0.9 % 250 mL IVPB        750 mg 265 mL/hr over 60 Minutes Intravenous  Once 09/20/23 0959 09/20/23 1226   09/20/23 0945  vancomycin (VANCOREADY) IVPB 750 mg/150 mL  Status:   Discontinued        750 mg 150 mL/hr over 60 Minutes Intravenous  Once 09/20/23 0855 09/20/23 0959   09/10/23 1200  vancomycin (VANCOREADY) IVPB 750 mg/150 mL  Status:  Discontinued        750 mg 150 mL/hr over 60 Minutes Intravenous Every M-W-F (Hemodialysis) 09/09/23 2030 09/20/23 0446   09/09/23 2200  linezolid (ZYVOX) tablet 600 mg  Status:  Discontinued        600 mg Oral Every 12 hours 09/09/23 2035 09/10/23 0946      Subjective: Patient was seen and examined at bedside.  Overnight events noted.   Patient reports feeling better.  Patient is frustrated with the wait to be discharged to skilled nursing facility.  Objective: Vitals:   10/17/23 2135 10/18/23 0427 10/18/23 0833 10/18/23 0833  BP: (!) 126/90 105/74 135/76 135/76  Pulse: 100 98 68 68  Resp: 18 18 18 18   Temp: 97.6 F (36.4 C) 97.6 F (36.4 C) 98 F (36.7 C) 98 F (36.7 C)  TempSrc: Oral Oral    SpO2: 98% 94%  95% 95%  Weight:      Height:        Intake/Output Summary (Last 24 hours) at 10/18/2023 1416 Last data filed at 10/18/2023 0805 Gross per 24 hour  Intake 240 ml  Output 0 ml  Net 240 ml   Filed Weights   10/14/23 1436 10/16/23 1626 10/16/23 1939  Weight: 96.8 kg 96 kg 91.2 kg    Examination:  General exam: Appears comfortable, deconditioned, not in any acute distress. Respiratory system: CTA bilaterally.  Respiratory effort normal.  RR 15 Cardiovascular system: S1 & S2 heard, RRR. No JVD, murmurs, rubs, gallops or clicks. Gastrointestinal system: Abdomen is non distended, soft and non tender. Normal bowel sounds heard. Central nervous system: Alert and oriented x 3. No focal neurological deficits. Extremities: Left upper extremity swollen, tender.  Left BKA with no edema. Skin: No rashes, lesions or ulcers Psychiatry: Judgement and insight appear normal. Mood & affect appropriate.     Data Reviewed: I have personally reviewed following labs and imaging studies  CBC: Recent Labs  Lab  10/14/23 1504 10/16/23 0411 10/17/23 1121  WBC 5.7 5.6 6.3  NEUTROABS  --   --  3.8  HGB 8.5* 8.5* 9.5*  HCT 29.6* 28.6* 31.5*  MCV 96.4 93.5 91.8  PLT 156 147* 148*   Basic Metabolic Panel: Recent Labs  Lab 10/14/23 1504 10/16/23 0411 10/16/23 2119 10/17/23 1121  NA 136 135  --  136  K 5.9* 6.2* 4.4 4.9  CL 94* 94*  --  96*  CO2 26 24  --  25  GLUCOSE 289* 281*  --  137*  BUN 62* 52*  --  38*  CREATININE 7.73* 6.77*  --  5.72*  CALCIUM 9.1 9.3  --  9.7  MG 2.5* 2.3  --   --   PHOS 9.8* 8.3*  --   --    GFR: Estimated Creatinine Clearance: 17.7 mL/min (A) (by C-G formula based on SCr of 5.72 mg/dL (H)). Liver Function Tests: No results for input(s): "AST", "ALT", "ALKPHOS", "BILITOT", "PROT", "ALBUMIN" in the last 168 hours. No results for input(s): "LIPASE", "AMYLASE" in the last 168 hours. No results for input(s): "AMMONIA" in the last 168 hours. Coagulation Profile: No results for input(s): "INR", "PROTIME" in the last 168 hours. Cardiac Enzymes: No results for input(s): "CKTOTAL", "CKMB", "CKMBINDEX", "TROPONINI" in the last 168 hours. BNP (last 3 results) No results for input(s): "PROBNP" in the last 8760 hours. HbA1C: No results for input(s): "HGBA1C" in the last 72 hours. CBG: Recent Labs  Lab 10/17/23 1149 10/17/23 1552 10/17/23 2129 10/18/23 0753 10/18/23 1125  GLUCAP 191* 198* 173* 147* 186*   Lipid Profile: No results for input(s): "CHOL", "HDL", "LDLCALC", "TRIG", "CHOLHDL", "LDLDIRECT" in the last 72 hours. Thyroid Function Tests: No results for input(s): "TSH", "T4TOTAL", "FREET4", "T3FREE", "THYROIDAB" in the last 72 hours. Anemia Panel: No results for input(s): "VITAMINB12", "FOLATE", "FERRITIN", "TIBC", "IRON", "RETICCTPCT" in the last 72 hours. Sepsis Labs: No results for input(s): "PROCALCITON", "LATICACIDVEN" in the last 168 hours.  No results found for this or any previous visit (from the past 240 hours).   Radiology Studies: VAS  [place] UPPER EXTREMITY VENOUS DUPLEX Result Date: 10/17/2023 UPPER VENOUS STUDY  Patient Name:  [name]  Date of Exam:   10/17/2023 Medical Rec #: [id number]      Accession #:    [id number] Date of Birth: Nov 28, 1980     Patient Gender: M Patient Age:   57 years Exam  Location:  [facility] Procedure:      VAS [place] UPPER EXTREMITY VENOUS DUPLEX Referring Phys: Kathlen Mody --------------------------------------------------------------------------------  Indications: Swelling Risk Factors: DVT Left IJV, Subclavian, and axillary DVT 08/02/2023. Anticoagulation: Eliquis. Limitations: Body habitus,musculoskeletal features, poor ultrasound/tissue interface and Edema, old dialysis access shadowing. Comparison Study: Prior LUEV done 08/02/2023 Performing Technologist: Sherren Kerns RVS  Examination Guidelines: A complete evaluation includes B-mode imaging, spectral Doppler, color Doppler, and power Doppler as needed of all accessible portions of each vessel. Bilateral testing is considered an integral part of a complete examination. Limited examinations for reoccurring indications may be performed as noted.  Right Findings: +----------+------------+---------+-----------+----------+--------------+ RIGHT     CompressiblePhasicitySpontaneousProperties   Summary     +----------+------------+---------+-----------+----------+--------------+ Subclavian                                          Not visualized +----------+------------+---------+-----------+----------+--------------+  Left Findings: +----------+------------+---------+-----------+----------+-----------------+ LEFT      CompressiblePhasicitySpontaneousProperties     Summary      +----------+------------+---------+-----------+----------+-----------------+ IJV         Partial      No        No                    Chronic      +----------+------------+---------+-----------+----------+-----------------+ Subclavian                                            Not visualized   +----------+------------+---------+-----------+----------+-----------------+ Axillary      None                                  Age Indeterminate +----------+------------+---------+-----------+----------+-----------------+ Brachial      Full                                                    +----------+------------+---------+-----------+----------+-----------------+ Radial                                               Not visualized   +----------+------------+---------+-----------+----------+-----------------+ Ulnar                                                Not visualized   +----------+------------+---------+-----------+----------+-----------------+ Cephalic                                             Not visualized   +----------+------------+---------+-----------+----------+-----------------+ Basilic                                              Not visualized   +----------+------------+---------+-----------+----------+-----------------+  Summary:  Right: Right subclavian not visualized.  Left: Findings consistent with age indeterminate deep vein thrombosis involving the left axillary vein. 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 Significantly limited study secondary to body habitus, skin texture, shadowing from old dialysis access. Further imaging could be considered if clinically indicated.  *See table(s) above for measurements and observations.     Preliminary    MR BRAIN WO CONTRAST Result Date: 10/17/2023 CLINICAL DATA:  Altered mental status EXAM: MRI HEAD WITHOUT CONTRAST TECHNIQUE: Multiplanar, multiecho pulse sequences of the brain and surrounding structures were obtained without intravenous contrast. COMPARISON:  08/23/2023 FINDINGS: Brain: Decreased size of area of abnormal diffusion restriction within the posterior right frontal operculum. The amount of  surrounding edema has also decreased. Chronic microhemorrhage in the left cerebellum. Chronic blood products at the right frontal cavity, status post transcranial intervention. The midline structures are normal. Vascular: Normal flow voids. Skull and upper cervical spine: Right parietal craniotomy. Sinuses/Orbits:Bilateral mastoid effusions. Paranasal sinuses are clear. Normal orbits. IMPRESSION: 1. Decreased size of area of abnormal diffusion restriction within the posterior right frontal operculum compatible with reported abscess. The amount of surrounding edema has also decreased. 2. Chronic blood products at the right frontal cavity, status post transcranial intervention. Electronically Signed   By: Deatra Robinson M.D.   On: 10/17/2023 01:29   Scheduled Meds:  (feeding supplement) PROSource Plus  30 mL Oral BID BM   apixaban  5 mg Oral BID   bacitracin   Topical Daily   Chlorhexidine Gluconate Cloth  6 each Topical Q0600   darbepoetin (ARANESP) injection - DIALYSIS  100 mcg Subcutaneous Q Sun-1800   dextrose  1 Tube Oral Once   dextrose  25 mL Intravenous Once   gabapentin  100 mg Oral TID   Gerhardt's butt cream   Topical BID   influenza vac split trivalent PF  0.5 mL Intramuscular Tomorrow-1000   insulin aspart  0-5 Units Subcutaneous QHS   insulin aspart  0-6 Units Subcutaneous TID WC   insulin glargine-yfgn  8 Units Subcutaneous Daily   loratadine  10 mg Oral Daily   midodrine  10 mg Oral Q M,W,F-HD   pantoprazole  40 mg Oral Daily   povidone-Iodine   Topical Q0600   QUEtiapine  12.5 mg Oral q morning   QUEtiapine  25 mg Oral QHS   senna-docusate  2 tablet Oral BID   sevelamer carbonate  2,400 mg Oral TID WC   sodium zirconium cyclosilicate  10 g Oral TID   Continuous Infusions:  albumin human 25 g (10/16/23 1703)   vancomycin Stopped (10/16/23 1918)     LOS: 39 days    Time spent: 35 mins    Willeen Niece, MD [facility]   If 7PM-7AM, please contact  night-coverage

## 2023-10-18 NOTE — Progress Notes (Signed)
Navigator following to assist with out-pt HD needs. Pt was at [facility] prior to admission but pt re-placed at [facility] on TTS 10:10 am due to [facility] being at capacity. Once d/c date and snf is confirmed, navigator can check to see if [facility] has had any chairs become available. Will assist as needed.   Olivia Canter Renal Navigator [phone]

## 2023-10-18 NOTE — Progress Notes (Signed)
[facility] Progress Note   Subjective:   Seen in room, mother visiting. No new events. Waiting on SNF bed. Next dialysis Tues.   Objective Vitals:   10/17/23 2135 10/18/23 0427 10/18/23 0833 10/18/23 0833  BP: (!) 126/90 105/74 135/76 135/76  Pulse: 100 98 68 68  Resp: 18 18 18 18   Temp: 97.6 F (36.4 C) 97.6 F (36.4 C) 98 F (36.7 C) 98 F (36.7 C)  TempSrc: Oral Oral    SpO2: 98% 94% 95% 95%  Weight:      Height:       Physical Exam General: Chronically ill appearing man, NAD. Room air Heart: RRR; 2/6 murmur Lungs: CTA anteriorly Abdomen: soft Extremities: 1-2+ BLE and LUE edema Dialysis Access: L femoral TDC  Additional Objective Labs: Basic Metabolic Panel: Recent Labs  Lab 10/14/23 1504 10/16/23 0411 10/16/23 2119 10/17/23 1121  NA 136 135  --  136  K 5.9* 6.2* 4.4 4.9  CL 94* 94*  --  96*  CO2 26 24  --  25  GLUCOSE 289* 281*  --  137*  BUN 62* 52*  --  38*  CREATININE 7.73* 6.77*  --  5.72*  CALCIUM 9.1 9.3  --  9.7  PHOS 9.8* 8.3*  --   --    CBC: Recent Labs  Lab 10/14/23 1504 10/16/23 0411 10/17/23 1121  WBC 5.7 5.6 6.3  NEUTROABS  --   --  3.8  HGB 8.5* 8.5* 9.5*  HCT 29.6* 28.6* 31.5*  MCV 96.4 93.5 91.8  PLT 156 147* 148*   CBG: Recent Labs  Lab 10/17/23 1149 10/17/23 1552 10/17/23 2129 10/18/23 0753 10/18/23 1125  GLUCAP 191* 198* 173* 147* 186*   Studies/Results: VAS [place] UPPER EXTREMITY VENOUS DUPLEX Result Date: 10/17/2023 UPPER VENOUS STUDY  Patient Name:  [name]  Date of Exam:   10/17/2023 Medical Rec #: [id number]      Accession #:    [id number] Date of Birth: [birth year]     Patient Gender: M Patient Age:   43 years Exam Location:  [facility] Procedure:      VAS [place] UPPER EXTREMITY VENOUS DUPLEX Referring Phys: Kathlen Mody --------------------------------------------------------------------------------  Indications: Swelling Risk Factors: DVT Left IJV, Subclavian, and axillary DVT 08/02/2023.  Anticoagulation: Eliquis. Limitations: Body habitus,musculoskeletal features, poor ultrasound/tissue interface and Edema, old dialysis access shadowing. Comparison Study: Prior LUEV done 08/02/2023 Performing Technologist: Sherren Kerns RVS  Examination Guidelines: A complete evaluation includes B-mode imaging, spectral Doppler, color Doppler, and power Doppler as needed of all accessible portions of each vessel. Bilateral testing is considered an integral part of a complete examination. Limited examinations for reoccurring indications may be performed as noted.  Right Findings: +----------+------------+---------+-----------+----------+--------------+ RIGHT     CompressiblePhasicitySpontaneousProperties   Summary     +----------+------------+---------+-----------+----------+--------------+ Subclavian                                          Not visualized +----------+------------+---------+-----------+----------+--------------+  Left Findings: +----------+------------+---------+-----------+----------+-----------------+ LEFT      CompressiblePhasicitySpontaneousProperties     Summary      +----------+------------+---------+-----------+----------+-----------------+ IJV         Partial      No        No                    Chronic      +----------+------------+---------+-----------+----------+-----------------+ Subclavian  Not visualized   +----------+------------+---------+-----------+----------+-----------------+ Axillary      None                                  Age Indeterminate +----------+------------+---------+-----------+----------+-----------------+ Brachial      Full                                                    +----------+------------+---------+-----------+----------+-----------------+ Radial                                               Not visualized    +----------+------------+---------+-----------+----------+-----------------+ Ulnar                                                Not visualized   +----------+------------+---------+-----------+----------+-----------------+ Cephalic                                             Not visualized   +----------+------------+---------+-----------+----------+-----------------+ Basilic                                              Not visualized   +----------+------------+---------+-----------+----------+-----------------+  Summary:  Right: Right subclavian not visualized.  Left: Findings consistent with age indeterminate deep vein thrombosis involving the left axillary vein. 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 Significantly limited study secondary to body habitus, skin texture, shadowing from old dialysis access. Further imaging could be considered if clinically indicated.  *See table(s) above for measurements and observations.     Preliminary    MR BRAIN WO CONTRAST Result Date: 10/17/2023 CLINICAL DATA:  Altered mental status EXAM: MRI HEAD WITHOUT CONTRAST TECHNIQUE: Multiplanar, multiecho pulse sequences of the brain and surrounding structures were obtained without intravenous contrast. COMPARISON:  08/23/2023 FINDINGS: Brain: Decreased size of area of abnormal diffusion restriction within the posterior right frontal operculum. The amount of surrounding edema has also decreased. Chronic microhemorrhage in the left cerebellum. Chronic blood products at the right frontal cavity, status post transcranial intervention. The midline structures are normal. Vascular: Normal flow voids. Skull and upper cervical spine: Right parietal craniotomy. Sinuses/Orbits:Bilateral mastoid effusions. Paranasal sinuses are clear. Normal orbits. IMPRESSION: 1. Decreased size of area of abnormal diffusion restriction within the posterior right frontal operculum  compatible with reported abscess. The amount of surrounding edema has also decreased. 2. Chronic blood products at the right frontal cavity, status post transcranial intervention. Electronically Signed   By: Deatra Robinson M.D.   On: 10/17/2023 01:29   Medications:  albumin human 25 g (10/16/23 1703)   vancomycin Stopped (10/16/23 1918)    (feeding supplement) PROSource Plus  30 mL Oral BID BM   apixaban  5 mg Oral BID   bacitracin   Topical Daily   Chlorhexidine Gluconate Cloth  6  each Topical Q0600   darbepoetin (ARANESP) injection - DIALYSIS  100 mcg Subcutaneous Q Sun-1800   dextrose  1 Tube Oral Once   dextrose  25 mL Intravenous Once   gabapentin  100 mg Oral TID   Gerhardt's butt cream   Topical BID   influenza vac split trivalent PF  0.5 mL Intramuscular Tomorrow-1000   insulin aspart  0-5 Units Subcutaneous QHS   insulin aspart  0-6 Units Subcutaneous TID WC   insulin glargine-yfgn  8 Units Subcutaneous Daily   loratadine  10 mg Oral Daily   midodrine  10 mg Oral Q M,W,F-HD   pantoprazole  40 mg Oral Daily   povidone-Iodine   Topical Q0600   QUEtiapine  12.5 mg Oral q morning   QUEtiapine  25 mg Oral QHS   senna-docusate  2 tablet Oral BID   sevelamer carbonate  2,400 mg Oral TID WC   sodium zirconium cyclosilicate  10 g Oral TID    Dialysis Orders MWF - [facility] -> will be going to [place] TTS on discharge 4hr, 400/A1.5, EDW 77kg, L femoral TDC, 2K/3Ca bath, no heparin   Assessment/Plan: MRSA bacteremia with MV/TV endocarditis + brain abscess:  Not a candidate for valve surgery, S/p line holiday and brain abscess drainage (at [facility]). Continues on extended Vanc course - will be on Vanc IV q HD until 10/27/23. MRSA brain abscess: S/p transfer to [facility] and drainage 08/31/24. Repeat brain MRI 10/17/23 showed improvement. Acute B CVA/septic emboli Acute LUE DVT: On Eliquis ESRD: Transitioned to TTS, next HD 1/21.  HTN/volume: BP stable, CT with diffuse  anasarca. Significantly above EDW. Max UF with HD. Anemia of ESRD: Hgb 8.5, Aranesp ^ to 150 mcg q Sunday while here. No IV iron while on abx. Secondary HPTH: CorrCa/Phos high. Continue to hold calcitriol. Sevelamer increased to 3/meals. Hyperkalemia: Persistently high despite Lokelma 10g TID, using 1K bath occ with HD. Nutrition: Alb low, continue supplements.  T1DM: per primary Dispo: SNF being arranged - not finalized yet    Tomasa Blase PA-C [facility] 10/18/2023,12:31 PM

## 2023-10-19 DIAGNOSIS — D631 Anemia in chronic kidney disease: Secondary | ICD-10-CM | POA: Diagnosis not present

## 2023-10-19 DIAGNOSIS — I120 Hypertensive chronic kidney disease with stage 5 chronic kidney disease or end stage renal disease: Secondary | ICD-10-CM | POA: Diagnosis not present

## 2023-10-19 DIAGNOSIS — I330 Acute and subacute infective endocarditis: Secondary | ICD-10-CM | POA: Diagnosis not present

## 2023-10-19 DIAGNOSIS — R7881 Bacteremia: Secondary | ICD-10-CM | POA: Diagnosis not present

## 2023-10-19 DIAGNOSIS — I38 Endocarditis, valve unspecified: Secondary | ICD-10-CM | POA: Diagnosis not present

## 2023-10-19 DIAGNOSIS — Z992 Dependence on renal dialysis: Secondary | ICD-10-CM | POA: Diagnosis not present

## 2023-10-19 DIAGNOSIS — B9562 Methicillin resistant Staphylococcus aureus infection as the cause of diseases classified elsewhere: Secondary | ICD-10-CM | POA: Diagnosis not present

## 2023-10-19 DIAGNOSIS — N250 Renal osteodystrophy: Secondary | ICD-10-CM | POA: Diagnosis not present

## 2023-10-19 DIAGNOSIS — N186 End stage renal disease: Secondary | ICD-10-CM | POA: Diagnosis not present

## 2023-10-19 LAB — RENAL FUNCTION PANEL
Albumin: 3.6 g/dL (ref 3.5–5.0)
Anion gap: 19 — ABNORMAL HIGH (ref 5–15)
BUN: 56 mg/dL — ABNORMAL HIGH (ref 6–20)
CO2: 24 mmol/L (ref 22–32)
Calcium: 9.7 mg/dL (ref 8.9–10.3)
Chloride: 94 mmol/L — ABNORMAL LOW (ref 98–111)
Creatinine, Ser: 7.67 mg/dL — ABNORMAL HIGH (ref 0.61–1.24)
GFR, Estimated: 8 mL/min — ABNORMAL LOW (ref >60)
Glucose, Bld: 75 mg/dL (ref 70–99)
Phosphorus: 7.9 mg/dL — ABNORMAL HIGH (ref 2.5–4.6)
Potassium: 5.2 mmol/L — ABNORMAL HIGH (ref 3.5–5.1)
Sodium: 137 mmol/L (ref 135–145)

## 2023-10-19 LAB — GLUCOSE, CAPILLARY
Glucose-Capillary: 78 mg/dL (ref 70–99)
Glucose-Capillary: 153 mg/dL — ABNORMAL HIGH (ref 70–99)
Glucose-Capillary: 151 mg/dL — ABNORMAL HIGH (ref 70–99)

## 2023-10-19 LAB — COMPREHENSIVE METABOLIC PANEL
ALT: 10 U/L (ref 0–44)
AST: 20 U/L (ref 15–41)
Albumin: 3.9 g/dL (ref 3.5–5.0)
Alkaline Phosphatase: 150 U/L — ABNORMAL HIGH (ref 38–126)
Anion gap: 17 — ABNORMAL HIGH (ref 5–15)
BUN: 29 mg/dL — ABNORMAL HIGH (ref 6–20)
CO2: 25 mmol/L (ref 22–32)
Calcium: 9.3 mg/dL (ref 8.9–10.3)
Chloride: 95 mmol/L — ABNORMAL LOW (ref 98–111)
Creatinine, Ser: 4.33 mg/dL — ABNORMAL HIGH (ref 0.61–1.24)
GFR, Estimated: 16 mL/min — ABNORMAL LOW (ref >60)
Glucose, Bld: 143 mg/dL — ABNORMAL HIGH (ref 70–99)
Potassium: 4.5 mmol/L (ref 3.5–5.1)
Sodium: 137 mmol/L (ref 135–145)
Total Bilirubin: 1.3 mg/dL — ABNORMAL HIGH (ref 0.0–1.2)
Total Protein: 7.7 g/dL (ref 6.5–8.1)

## 2023-10-19 LAB — HEPATITIS B SURFACE ANTIGEN: Hepatitis B Surface Ag: NONREACTIVE

## 2023-10-19 LAB — CBC
HCT: 33.6 % — ABNORMAL LOW (ref 39.0–52.0)
Hemoglobin: 10.1 g/dL — ABNORMAL LOW (ref 13.0–17.0)
MCH: 27.7 pg (ref 26.0–34.0)
MCHC: 30.1 g/dL (ref 30.0–36.0)
MCV: 92.1 fL (ref 80.0–100.0)
Platelets: 179 10*3/uL (ref 150–400)
RBC: 3.65 MIL/uL — ABNORMAL LOW (ref 4.22–5.81)
RDW: 15.7 % — ABNORMAL HIGH (ref 11.5–15.5)
WBC: 5.8 10*3/uL (ref 4.0–10.5)
nRBC: 0 % (ref 0.0–0.2)

## 2023-10-19 LAB — HEPATIC FUNCTION PANEL
ALT: 8 U/L (ref 0–44)
AST: 19 U/L (ref 15–41)
Albumin: 3.8 g/dL (ref 3.5–5.0)
Alkaline Phosphatase: 150 U/L — ABNORMAL HIGH (ref 38–126)
Bilirubin, Direct: 0.4 mg/dL — ABNORMAL HIGH (ref 0.0–0.2)
Indirect Bilirubin: 0.9 mg/dL (ref 0.3–0.9)
Total Bilirubin: 1.3 mg/dL — ABNORMAL HIGH (ref 0.0–1.2)
Total Protein: 7.8 g/dL (ref 6.5–8.1)

## 2023-10-19 MED ORDER — LIDOCAINE HCL (PF) 1 % IJ SOLN: 5.0000 mL | INTRAMUSCULAR | Status: DC | PRN

## 2023-10-19 MED ORDER — ALTEPLASE 2 MG IJ SOLR: 2.0000 mg | Freq: Once | INTRAMUSCULAR | Status: DC | PRN

## 2023-10-19 MED ORDER — LIDOCAINE-PRILOCAINE 2.5-2.5 % EX CREA
1.0000 | TOPICAL_CREAM | CUTANEOUS | Status: DC | PRN
Start: 2023-10-19 — End: 2023-10-19

## 2023-10-19 MED ORDER — HEPARIN SODIUM (PORCINE) 1000 UNIT/ML DIALYSIS
1000.0000 [IU] | INTRAMUSCULAR | Status: DC | PRN
  Administered 2023-10-19: 4600 [IU]

## 2023-10-19 MED ORDER — ANTICOAGULANT SODIUM CITRATE 4% (200MG/5ML) IV SOLN: 5.0000 mL | Status: DC | PRN

## 2023-10-19 MED ORDER — PENTAFLUOROPROP-TETRAFLUOROETH EX AERO
1.0000 | INHALATION_SPRAY | CUTANEOUS | Status: DC | PRN
Start: 2023-10-19 — End: 2023-10-19

## 2023-10-19 NOTE — Progress Notes (Signed)
PROGRESS NOTE    [name]  YQM:[id number] DOB: 09-Oct-1980 DOA: 09/09/2023 PCP: [name], NP    Brief Narrative:  This 43 y.o. male with medical history significant of ESRD on hemodialysis (not fully compliant), insulin-dependent type 1 diabetes, gastroparesis, Left AKA, admitted to [facility] on 07/09/23 for DKA, Sepsis and hospital course significant for MV AND TV  MRSA endocarditis, followed by acute MCA stroke , left occipital and bilateral cerebellar strokes consistent with septic emboli,  Brain abscess from MRSA, left IJ DVT was transferred to [facility] for MV repair on 11/29.  While at [facility] he underwent cardiac cath showing non obstructive disease, craniotomy with abscess drainage on 09/01/23,. He had subarachnoid hemorrhage while on IV heparin, right third toe distal necrosis,was seen by podiatry, but family wanted conservative management. Since his tertiary care needs have been completed, he was transferred back to [facility] for further management.  PTOT  recommending SNF and awaiting placement.   Assessment & Plan:   Principal Problem:   Endocarditis Active Problems:   MRSA bacteremia   Endocarditis of tricuspid valve   ESRD on dialysis [facility])   Cognitive impairment   Insulin dependent type 1 diabetes mellitus ([name])   Diabetic gastroparesis ([name])   Diabetic foot infection ([name])   Endocarditis of mitral valve   Cerebrovascular accident (CVA) due to embolism of cerebral artery ([name])  MRSA TV/MV endocarditis: 10/12, blood cultures positive for MRSA. 10/13, TTE showed LVEF of 25-30% with concern for AV/MV abnormality  10/16 and 10/22, TEE showed MV/TV vegetations.  Currently on IV vancomycin.  Per ID recommendation, to complete 8 weeks of treatment EOT 1/29. Recommend outpatient follow up with ID.    Hypotension: Resolved.  Continue Midodrine.    ESRD on HD: Hyperkalemia resolved.  Nephrology on board.  Continue hemodialysis as per schedule.  Acute CVA. / ACUTE  metabolic encephalopathy: Mri brain on 10/17 showed large right MCA infarct.  11/25, MRA head showed significant for large necrotic appearing lesion within the right frontal lobe within area of the prior right MCA territory infarct with vasogenic edema and 8 mm leftward midline shift.  Neurology recommendations for Eliquis.  PT/OT recommendations for SNF. Repeat MRI brain done for increased confusion. Shows decreased size of area of abnormal diffusion restriction within the posterior right frontal operculum compatible with reported abscess. The amount of surrounding edema has also decreased   Cerebral emboli with abscess/ Cerebritis Sec to MRSA Endocarditis.  S/p craniotomy and abscess drainage.    Type 1 DM uncontrolled with hyperglycemia: Resume SSI. Continue Semglee to 8 units daily.  No changes in meds.    Acute left upper extremity DVT: Continue Eliquis.  Left upper extremity more swollen and  tender.  Left upper extremity venous duplex shows age-indeterminate DVT involving the left axillary vein.   Anemia of chronic kidney disease: H&H stable.    Right third toe necrosis: H/o prior left BKA: Per documentation, patient/family opted for conservative management and declined surgical management. Patient was seen by PT/OT with recommendation for SNF on discharge.   Prostate fluid collection: Urology evaluated patient at [facility], suggested that its a dilated seminal vesicle without any surgical intervention.    Mild cognitive dysfunction with behavioral changes.  Currently stable on seroquel.          Estimated body mass index is 31.49 kg/m as calculated from the following:   Height as of this encounter: 5\' 7"  (1.702 m).   Weight as of this encounter: 91.2 kg.  DVT prophylaxis: Eliquis Code Status: Full code Family Communication: No family at bed side Disposition Plan:     Status is: Inpatient Remains inpatient appropriate because: Patient medically clear, awaiting  safe disposition.     Consultants:  Nephrology  Procedures: None  Antimicrobials:  Anti-infectives (From admission, onward)    Start     Dose/Rate Route Frequency Ordered Stop   10/16/23 1345  vancomycin (VANCOCIN) 750 mg in sodium chloride 0.9 % 250 mL IVPB        750 mg 265 mL/hr over 60 Minutes Intravenous Every T-Th-Sa (Hemodialysis) 10/16/23 1257 10/28/23 1159   10/16/23 1200  vancomycin (VANCOREADY) IVPB 750 mg/150 mL  Status:  Discontinued        750 mg 150 mL/hr over 60 Minutes Intravenous Every T-Th-Sa (Hemodialysis) 10/14/23 1843 10/16/23 1257   10/14/23 1845  vancomycin (VANCOREADY) IVPB 500 mg/100 mL        500 mg 100 mL/hr over 60 Minutes Intravenous  Once 10/14/23 1843 10/15/23 0806   10/14/23 1200  vancomycin (VANCOREADY) IVPB 500 mg/100 mL  Status:  Discontinued        500 mg 100 mL/hr over 60 Minutes Intravenous Every T-Th-Sa (Hemodialysis) 10/13/23 1309 10/14/23 1843   10/13/23 1200  vancomycin (VANCOREADY) IVPB 500 mg/100 mL  Status:  Discontinued        500 mg 100 mL/hr over 60 Minutes Intravenous Every M-W-F (Hemodialysis) 10/11/23 1517 10/13/23 1309   10/12/23 1200  vancomycin (VANCOREADY) IVPB 500 mg/100 mL  Status:  Discontinued        500 mg 100 mL/hr over 60 Minutes Intravenous Every T-Th-Sa (Hemodialysis) 10/11/23 1514 10/11/23 1517   09/24/23 1200  vancomycin (VANCOREADY) IVPB 500 mg/100 mL  Status:  Discontinued        500 mg 100 mL/hr over 60 Minutes Intravenous Every M-W-F (Hemodialysis) 09/21/23 1246 10/11/23 1514   09/20/23 1200  vancomycin (VANCOCIN) 750 mg in sodium chloride 0.9 % 250 mL IVPB  Status:  Discontinued        750 mg 265 mL/hr over 60 Minutes Intravenous Every M-W-F (Hemodialysis) 09/20/23 0446 09/21/23 1246   09/20/23 1045  vancomycin (VANCOCIN) 750 mg in sodium chloride 0.9 % 250 mL IVPB        750 mg 265 mL/hr over 60 Minutes Intravenous  Once 09/20/23 0959 09/20/23 1226   09/20/23 0945  vancomycin (VANCOREADY) IVPB 750 mg/150  mL  Status:  Discontinued        750 mg 150 mL/hr over 60 Minutes Intravenous  Once 09/20/23 0855 09/20/23 0959   09/10/23 1200  vancomycin (VANCOREADY) IVPB 750 mg/150 mL  Status:  Discontinued        750 mg 150 mL/hr over 60 Minutes Intravenous Every M-W-F (Hemodialysis) 09/09/23 2030 09/20/23 0446   09/09/23 2200  linezolid (ZYVOX) tablet 600 mg  Status:  Discontinued        600 mg Oral Every 12 hours 09/09/23 2035 09/10/23 0946      Subjective: Patient was seen and examined at bedside.  Overnight events noted.  Patient reports feeling better, stated he is frustrated with the wait to be discharged to skilled nursing facility. He is now showing good behavior.  Objective: Vitals:   10/18/23 2004 10/19/23 0440 10/19/23 0600 10/19/23 0835  BP: (!) 159/48 (!) 136/98 (!) 136/98 (!) 137/96  Pulse: (!) 114 (!) 110 (!) 110   Resp: 20 20 20 18   Temp: (!) 97.3 F (36.3 C) (!) 97.5 F (36.4 C) (!) 97.5  F (36.4 C) 98.3 F (36.8 C)  TempSrc: Oral     SpO2: 99% 99% 99% 93%  Weight:      Height:        Intake/Output Summary (Last 24 hours) at 10/19/2023 1123 Last data filed at 10/19/2023 0658 Gross per 24 hour  Intake 120 ml  Output 0 ml  Net 120 ml   Filed Weights   10/14/23 1436 10/16/23 1626 10/16/23 1939  Weight: 96.8 kg 96 kg 91.2 kg    Examination:  General exam: Appears comfortable, deconditioned, not in any acute distress. Respiratory system: CTA bilaterally.  Respiratory effort normal.  RR 13 Cardiovascular system: S1 & S2 heard, RRR. No JVD, murmurs, rubs, gallops or clicks. Gastrointestinal system: Abdomen is non distended, soft and non tender. Normal bowel sounds heard. Central nervous system: Alert and oriented x 3. No focal neurological deficits. Extremities: Left upper extremity swollen, tender.  Left BKA with no edema. Skin: No rashes, lesions or ulcers Psychiatry: Judgement and insight appear normal. Mood & affect appropriate.     Data Reviewed: I have  personally reviewed following labs and imaging studies  CBC: Recent Labs  Lab 10/14/23 1504 10/16/23 0411 10/17/23 1121  WBC 5.7 5.6 6.3  NEUTROABS  --   --  3.8  HGB 8.5* 8.5* 9.5*  HCT 29.6* 28.6* 31.5*  MCV 96.4 93.5 91.8  PLT 156 147* 148*   Basic Metabolic Panel: Recent Labs  Lab 10/14/23 1504 10/16/23 0411 10/16/23 2119 10/17/23 1121  NA 136 135  --  136  K 5.9* 6.2* 4.4 4.9  CL 94* 94*  --  96*  CO2 26 24  --  25  GLUCOSE 289* 281*  --  137*  BUN 62* 52*  --  38*  CREATININE 7.73* 6.77*  --  5.72*  CALCIUM 9.1 9.3  --  9.7  MG 2.5* 2.3  --   --   PHOS 9.8* 8.3*  --   --    GFR: Estimated Creatinine Clearance: 17.7 mL/min (A) (by C-G formula based on SCr of 5.72 mg/dL (H)). Liver Function Tests: No results for input(s): "AST", "ALT", "ALKPHOS", "BILITOT", "PROT", "ALBUMIN" in the last 168 hours. No results for input(s): "LIPASE", "AMYLASE" in the last 168 hours. No results for input(s): "AMMONIA" in the last 168 hours. Coagulation Profile: No results for input(s): "INR", "PROTIME" in the last 168 hours. Cardiac Enzymes: No results for input(s): "CKTOTAL", "CKMB", "CKMBINDEX", "TROPONINI" in the last 168 hours. BNP (last 3 results) No results for input(s): "PROBNP" in the last 8760 hours. HbA1C: No results for input(s): "HGBA1C" in the last 72 hours. CBG: Recent Labs  Lab 10/18/23 0753 10/18/23 1125 10/18/23 1645 10/18/23 2011 10/19/23 0757  GLUCAP 147* 186* 258* 231* 78   Lipid Profile: No results for input(s): "CHOL", "HDL", "LDLCALC", "TRIG", "CHOLHDL", "LDLDIRECT" in the last 72 hours. Thyroid Function Tests: No results for input(s): "TSH", "T4TOTAL", "FREET4", "T3FREE", "THYROIDAB" in the last 72 hours. Anemia Panel: No results for input(s): "VITAMINB12", "FOLATE", "FERRITIN", "TIBC", "IRON", "RETICCTPCT" in the last 72 hours. Sepsis Labs: No results for input(s): "PROCALCITON", "LATICACIDVEN" in the last 168 hours.  No results found for  this or any previous visit (from the past 240 hours).   Radiology Studies: VAS [place] UPPER EXTREMITY VENOUS DUPLEX Result Date: 10/17/2023 UPPER VENOUS STUDY  Patient Name:  [name]  Date of Exam:   10/17/2023 Medical Rec #: [id number]      Accession #:    [id number] Date  of Birth: 03-06-1981     Patient Gender: M Patient Age:   73 years Exam Location:  [facility] Procedure:      VAS [place] UPPER EXTREMITY VENOUS DUPLEX Referring Phys: Kathlen Mody --------------------------------------------------------------------------------  Indications: Swelling Risk Factors: DVT Left IJV, Subclavian, and axillary DVT 08/02/2023. Anticoagulation: Eliquis. Limitations: Body habitus,musculoskeletal features, poor ultrasound/tissue interface and Edema, old dialysis access shadowing. Comparison Study: Prior LUEV done 08/02/2023 Performing Technologist: Sherren Kerns RVS  Examination Guidelines: A complete evaluation includes B-mode imaging, spectral Doppler, color Doppler, and power Doppler as needed of all accessible portions of each vessel. Bilateral testing is considered an integral part of a complete examination. Limited examinations for reoccurring indications may be performed as noted.  Right Findings: +----------+------------+---------+-----------+----------+--------------+ RIGHT     CompressiblePhasicitySpontaneousProperties   Summary     +----------+------------+---------+-----------+----------+--------------+ Subclavian                                          Not visualized +----------+------------+---------+-----------+----------+--------------+  Left Findings: +----------+------------+---------+-----------+----------+-----------------+ LEFT      CompressiblePhasicitySpontaneousProperties     Summary      +----------+------------+---------+-----------+----------+-----------------+ IJV         Partial      No        No                    Chronic       +----------+------------+---------+-----------+----------+-----------------+ Subclavian                                           Not visualized   +----------+------------+---------+-----------+----------+-----------------+ Axillary      None                                  Age Indeterminate +----------+------------+---------+-----------+----------+-----------------+ Brachial      Full                                                    +----------+------------+---------+-----------+----------+-----------------+ Radial                                               Not visualized   +----------+------------+---------+-----------+----------+-----------------+ Ulnar                                                Not visualized   +----------+------------+---------+-----------+----------+-----------------+ Cephalic                                             Not visualized   +----------+------------+---------+-----------+----------+-----------------+ Basilic  Not visualized   +----------+------------+---------+-----------+----------+-----------------+  Summary:  Right: Right subclavian not visualized.  Left: Findings consistent with age indeterminate deep vein thrombosis involving the left axillary vein. 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 Significantly limited study secondary to body habitus, skin texture, shadowing from old dialysis access. Further imaging could be considered if clinically indicated.  *See table(s) above for measurements and observations.     Preliminary    Scheduled Meds:  (feeding supplement) PROSource Plus  30 mL Oral BID BM   apixaban  5 mg Oral BID   bacitracin   Topical Daily   Chlorhexidine Gluconate Cloth  6 each Topical Q0600   darbepoetin (ARANESP) injection - DIALYSIS  100 mcg Subcutaneous Q Sun-1800   dextrose  1 Tube Oral Once    dextrose  25 mL Intravenous Once   gabapentin  100 mg Oral TID   Gerhardt's butt cream   Topical BID   influenza vac split trivalent PF  0.5 mL Intramuscular Tomorrow-1000   insulin aspart  0-5 Units Subcutaneous QHS   insulin aspart  0-6 Units Subcutaneous TID WC   insulin glargine-yfgn  8 Units Subcutaneous Daily   loratadine  10 mg Oral Daily   midodrine  10 mg Oral Q M,W,F-HD   pantoprazole  40 mg Oral Daily   povidone-Iodine   Topical Q0600   QUEtiapine  12.5 mg Oral q morning   QUEtiapine  25 mg Oral QHS   senna-docusate  2 tablet Oral BID   sevelamer carbonate  2,400 mg Oral TID WC   sodium zirconium cyclosilicate  10 g Oral TID   Continuous Infusions:  albumin human 25 g (10/16/23 1703)   anticoagulant sodium citrate     vancomycin Stopped (10/16/23 1918)     LOS: 40 days    Time spent: 35 mins    Willeen Niece, MD [facility]   If 7PM-7AM, please contact night-coverage

## 2023-10-19 NOTE — Progress Notes (Signed)
Pt's case discussed with CSW this morning. Pt has a possible snf offer if HD clinic can be changed to [facility] [place], [facility], [facility], or [facility]. [facility] has no availability. Referral submitted to Kim with [facility] out-pt HD. Will await review of pt's referral. Requested [facility] [place] clinic due to clinic being the closest to snf. Will assist as needed.   Olivia Canter Renal Navigator [phone]

## 2023-10-19 NOTE — Progress Notes (Signed)
[facility] Progress Note   Subjective:   Seen in room. No new complaints. No cp/sob. Awaiting SNF placement. For dialysis today.   Objective Vitals:   10/18/23 2004 10/19/23 0440 10/19/23 0600 10/19/23 0835  BP: (!) 159/48 (!) 136/98 (!) 136/98 (!) 137/96  Pulse: (!) 114 (!) 110 (!) 110   Resp: 20 20 20 18   Temp: (!) 97.3 F (36.3 C) (!) 97.5 F (36.4 C) (!) 97.5 F (36.4 C) 98.3 F (36.8 C)  TempSrc: Oral     SpO2: 99% 99% 99% 93%  Weight:      Height:       Physical Exam General: Chronically ill appearing man, NAD. Room air Heart: RRR; 2/6 murmur Lungs: CTA anteriorly Abdomen: soft Extremities: 1-2+ BLE and LUE edema Dialysis Access: L femoral TDC  Additional Objective Labs: Basic Metabolic Panel: Recent Labs  Lab 10/14/23 1504 10/16/23 0411 10/16/23 2119 10/17/23 1121 10/19/23 1035  NA 136 135  --  136 137  K 5.9* 6.2* 4.4 4.9 5.2*  CL 94* 94*  --  96* 94*  CO2 26 24  --  25 24  GLUCOSE 289* 281*  --  137* 75  BUN 62* 52*  --  38* 56*  CREATININE 7.73* 6.77*  --  5.72* 7.67*  CALCIUM 9.1 9.3  --  9.7 9.7  PHOS 9.8* 8.3*  --   --  7.9*   CBC: Recent Labs  Lab 10/14/23 1504 10/16/23 0411 10/17/23 1121 10/19/23 1035  WBC 5.7 5.6 6.3 5.8  NEUTROABS  --   --  3.8  --   HGB 8.5* 8.5* 9.5* 10.1*  HCT 29.6* 28.6* 31.5* 33.6*  MCV 96.4 93.5 91.8 92.1  PLT 156 147* 148* 179   CBG: Recent Labs  Lab 10/18/23 0753 10/18/23 1125 10/18/23 1645 10/18/23 2011 10/19/23 0757  GLUCAP 147* 186* 258* 231* 78   Studies/Results: VAS [place] UPPER EXTREMITY VENOUS DUPLEX Result Date: 10/17/2023 UPPER VENOUS STUDY  Patient Name:  [name]  Date of Exam:   10/17/2023 Medical Rec #: [id number]      Accession #:    [id number] Date of Birth: November 22, 1980     Patient Gender: M Patient Age:   43 years Exam Location:  [facility] Procedure:      VAS [place] UPPER EXTREMITY VENOUS DUPLEX Referring Phys: Kathlen Mody  --------------------------------------------------------------------------------  Indications: Swelling Risk Factors: DVT Left IJV, Subclavian, and axillary DVT 08/02/2023. Anticoagulation: Eliquis. Limitations: Body habitus,musculoskeletal features, poor ultrasound/tissue interface and Edema, old dialysis access shadowing. Comparison Study: Prior LUEV done 08/02/2023 Performing Technologist: Sherren Kerns RVS  Examination Guidelines: A complete evaluation includes B-mode imaging, spectral Doppler, color Doppler, and power Doppler as needed of all accessible portions of each vessel. Bilateral testing is considered an integral part of a complete examination. Limited examinations for reoccurring indications may be performed as noted.  Right Findings: +----------+------------+---------+-----------+----------+--------------+ RIGHT     CompressiblePhasicitySpontaneousProperties   Summary     +----------+------------+---------+-----------+----------+--------------+ Subclavian                                          Not visualized +----------+------------+---------+-----------+----------+--------------+  Left Findings: +----------+------------+---------+-----------+----------+-----------------+ LEFT      CompressiblePhasicitySpontaneousProperties     Summary      +----------+------------+---------+-----------+----------+-----------------+ IJV         Partial      No  No                    Chronic      +----------+------------+---------+-----------+----------+-----------------+ Subclavian                                           Not visualized   +----------+------------+---------+-----------+----------+-----------------+ Axillary      None                                  Age Indeterminate +----------+------------+---------+-----------+----------+-----------------+ Brachial      Full                                                     +----------+------------+---------+-----------+----------+-----------------+ Radial                                               Not visualized   +----------+------------+---------+-----------+----------+-----------------+ Ulnar                                                Not visualized   +----------+------------+---------+-----------+----------+-----------------+ Cephalic                                             Not visualized   +----------+------------+---------+-----------+----------+-----------------+ Basilic                                              Not visualized   +----------+------------+---------+-----------+----------+-----------------+  Summary:  Right: Right subclavian not visualized.  Left: Findings consistent with age indeterminate deep vein thrombosis involving the left axillary vein. 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 Significantly limited study secondary to body habitus, skin texture, shadowing from old dialysis access. Further imaging could be considered if clinically indicated.  *See table(s) above for measurements and observations.     Preliminary    Medications:  albumin human 25 g (10/16/23 1703)   anticoagulant sodium citrate     vancomycin Stopped (10/16/23 1918)    (feeding supplement) PROSource Plus  30 mL Oral BID BM   apixaban  5 mg Oral BID   bacitracin   Topical Daily   Chlorhexidine Gluconate Cloth  6 each Topical Q0600   darbepoetin (ARANESP) injection - DIALYSIS  100 mcg Subcutaneous Q Sun-1800   dextrose  1 Tube Oral Once   dextrose  25 mL Intravenous Once   gabapentin  100 mg Oral TID   Gerhardt's butt cream   Topical BID   influenza vac split trivalent PF  0.5 mL Intramuscular Tomorrow-1000   insulin aspart  0-5 Units Subcutaneous QHS   insulin aspart  0-6 Units Subcutaneous TID WC   insulin glargine-yfgn  8  Units Subcutaneous Daily   loratadine  10 mg Oral Daily    midodrine  10 mg Oral Q M,W,F-HD   pantoprazole  40 mg Oral Daily   povidone-Iodine   Topical Q0600   QUEtiapine  12.5 mg Oral q morning   QUEtiapine  25 mg Oral QHS   senna-docusate  2 tablet Oral BID   sevelamer carbonate  2,400 mg Oral TID WC   sodium zirconium cyclosilicate  10 g Oral TID    Dialysis Orders MWF - [facility] -> will be going to [place] [facility] on discharge 4hr, 400/A1.5, EDW 77kg, L femoral TDC, 2K/3Ca bath, no heparin   Assessment/Plan: MRSA bacteremia with MV/TV endocarditis + brain abscess:  Not a candidate for valve surgery, S/p line holiday and brain abscess drainage (at [facility]). Continues on extended Vanc course - will be on Vanc IV q HD until 10/27/23. MRSA brain abscess: S/p transfer to [facility] and drainage 08/31/24. Repeat brain MRI 10/17/23 showed improvement. Acute B CVA/septic emboli Acute LUE DVT: On Eliquis ESRD: Transitioned to TTS, next HD 1/21.  HTN/volume: BP stable, CT with diffuse anasarca. Significantly above EDW. Max UF with HD. 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 Last 1/19.  No IV iron while on abx. Secondary HPTH: CorrCa/Phos high. Continue to hold calcitriol. Sevelamer increased to 3/meals. Hyperkalemia: Persistently high despite Lokelma 10g TID, using 1K bath occ with HD. Nutrition: Alb low, continue supplements.  T1DM: per primary Dispo: SNF being arranged - not finalized yet    Tomasa Blase PA-C [facility] 10/19/2023,12:14 PM

## 2023-10-19 NOTE — TOC Progression Note (Signed)
Transition of Care [facility]) - Progression Note    Patient Details  Name: [name] MRN: [id number] Date of Birth: 07-Dec-1980  Transition of Care [facility]) CM/SW Contact  Erin Sons, [name] Phone Number: 10/19/2023, 11:17 AM  Clinical Narrative:     CSW spoke with [facility] admissions. They would like more documentation of pt's behavior prior to assessing if they can offer bed for pt. CSW notified nursing staff and requesting documentation of his behavior.   [facility] SNF can offer a bed but would require pt's OPHD to be changed. [facility]. [facility]. [facility] [address] and [employer]. CSW notified renal navigator to inquire about available chair times.   Expected Discharge Plan: Skilled Nursing Facility Barriers to Discharge: Continued Medical Work up          Social Determinants of Health (SDOH) Interventions SDOH Screenings   Food Insecurity: No Food Insecurity (09/09/2023)  Housing: Unknown (09/09/2023)  Transportation Needs: No Transportation Needs (09/09/2023)  Utilities: Not At Risk (09/09/2023)  Alcohol Screen: Low Risk  (02/15/2023)  Depression (PHQ2-9): Low Risk  (02/15/2023)  Financial Resource Strain: Low Risk  (02/15/2023)  Physical Activity: Inactive (02/15/2023)  Social Connections: Socially Integrated (02/15/2023)  Stress: No Stress Concern Present (02/15/2023)  Tobacco Use: Low Risk  (09/12/2023)  Recent Concern: Tobacco Use - Medium Risk (09/01/2023)   Received from [facility]    Readmission Risk Interventions     No data to display

## 2023-10-19 NOTE — Progress Notes (Signed)
Went into patient's room this morning and explained the plan of care for the day. Patient was pleasant, agreeable with plan. He went down to dialysis without any issue. He was been calm and cooperative.

## 2023-10-19 NOTE — Progress Notes (Signed)
Occupational Therapy Treatment Patient Details Name: [name] MRN: [id number] DOB: [birth year] Today's Date: 10/19/2023   History of present illness 43 y.o. male presents to [facility] 09/09/23 as a transfer from [facility] for further management of endocarditis. Pt originally was admitted to [facility] 07/09/23 for DKA and sepsis after being found on the ground. 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 10/17 pt had a R MCA, L occipital, and B cerebellum CVA w/ septic emboli. ICU admit 10/18 w/ intubation 10/18-10/24. Pt was transferred to [facility] for MV repair on 11/29. [facility], pt had cardiac cath w/ non obstructive disease, SAH, a craniotomy w/ abscess drainage on 12/4, and R third toe distal necrosis. PMH: chronic L AKA, MRSA, IDDM, ESRD on HD MWF, HTN, TEE positive for vegetation on multiple valves, cellulitis, medical noncompliance, narcotic dependence, DM with gastroparesis, L internal jugular DVT.   OT comments  Patient agreeable to OT treatment but only to EOB due to expecting HD transport. Patient required max assist to get to EOB and was able to maintain sitting balance with CGA to min assist with Left residual limb resting on bed and LLE on floor. Patient able to tolerance grooming seated on EOB and limited LUE PROM due to pain before returning to supine with max assist. Patient performed rolling to adjust bed pads with min assist to roll to left and max assist to right. Patient will benefit from continued inpatient follow up therapy, <3 hours/day to continue to address transfers and self care. Acute OT to continue to follow.       If plan is discharge home, recommend the following:  Two people to help with walking and/or transfers;A lot of help with bathing/dressing/bathroom;Assistance with cooking/housework;Assistance with feeding;Help with stairs or ramp for entrance;Assist for transportation;Direct supervision/assist for financial management;Direct  supervision/assist for medications management   Equipment Recommendations  Other (comment) (defer to next level of care)    Recommendations for Other Services      Precautions / Restrictions Precautions Precautions: Fall;Other (comment) Precaution Comments: Contact precs; L AKA, L hemiparesis and edema, L neglect, bowel incontinence. L femoral HD cath Restrictions Weight Bearing Restrictions Per Provider Order: No LLE Weight Bearing Per Provider Order: Non weight bearing Other Position/Activity Restrictions: Pt has sling for LUE when mobilizing OOB; LLE prosthetic not fitting       Mobility Bed Mobility Overal bed mobility: Needs Assistance Bed Mobility: Rolling, Supine to Sit, Sit to Supine Rolling: Max assist, Min assist, Used rails   Supine to sit: Max assist Sit to supine: Max assist   General bed mobility comments: rolling to left with min assist and max assist to right. Transitioned from supine to EOB with Left residual limb resting on bed    Transfers                   General transfer comment: not attempted due to expecting HD transport     Balance Overall balance assessment: Needs assistance Sitting-balance support: Single extremity supported, Feet supported (right foot) Sitting balance-[name]: Poor Sitting balance - Comments: CGA to min assist seated on EOB with Right foot on floor and left residual limb resting on bed Postural control: Posterior lean, Left lateral lean                                 ADL either performed or assessed with clinical judgement   ADL  Overall ADL's : Needs assistance/impaired     Grooming: Wash/dry hands;Wash/dry face;Set up;Sitting Grooming Details (indicate cue type and reason): on EOB with assistance for balance                                    Extremity/Trunk Assessment              Vision       Perception     Praxis      Cognition Arousal: Alert Behavior During  Therapy: Flat affect Overall Cognitive Status: Impaired/Different from baseline Area of Impairment: Attention, Memory, Following commands, Safety/judgement, Awareness, Problem solving                   Current Attention Level: Selective Memory: Decreased short-term memory Following Commands: Follows one step commands consistently, Follows multi-step commands inconsistently, Follows one step commands with increased time Safety/Judgement: Decreased awareness of safety, Decreased awareness of deficits Awareness: Emergent Problem Solving: Difficulty sequencing, Requires verbal cues, Slow processing, Decreased initiation General Comments: appreciative of therapy working with him        Exercises Exercises: General Upper Extremity General Exercises - Upper Extremity Elbow Flexion: PROM, Left, 10 reps Elbow Extension: PROM, Left, 10 reps Wrist Flexion: PROM, Left, 10 reps Wrist Extension: PROM, Left, 10 reps Digit Composite Flexion: PROM, Left, 10 reps Composite Extension: PROM, Left, 10 reps    Shoulder Instructions       General Comments VSS on RA    Pertinent Vitals/ Pain       Pain Assessment Pain Assessment: Faces Faces Pain Scale: Hurts little more Pain Location: LUE with ROM Pain Descriptors / Indicators: Aching, Grimacing, Guarding Pain Intervention(s): Limited activity within patient's tolerance, Monitored during session, Repositioned  Home Living                                          Prior Functioning/Environment              Frequency  Min 1X/week        Progress Toward Goals  OT Goals(current goals can now be found in the care plan section)  Progress towards OT goals: Progressing toward goals  Acute Rehab OT Goals Patient Stated Goal: get stronger OT Goal Formulation: With patient Time For Goal Achievement: 10/21/23 Potential to Achieve Goals: Good ADL Goals Pt Will Perform Grooming: with set-up;sitting Pt Will  Perform Lower Body Bathing: with mod assist;sit to/from stand Pt Will Perform Lower Body Dressing: sitting/lateral leans;with max assist Pt Will Transfer to Toilet: with max assist;with transfer board;bedside commode Pt Will Perform Toileting - Clothing Manipulation and hygiene: with mod assist;sitting/lateral leans Pt/caregiver will Perform Home Exercise Program: Increased ROM;Left upper extremity Additional ADL Goal #1: Patient will complete bed mobility during/in preparation for functional tasks with Mod assist. Additional ADL Goal #2: Patient will demonstrate ability to attend to functional or therapeutic task for 5 or more minutes with no cues needed to maintain attention to task to increase safety and independence with functional tasks. Additional ADL Goal #3: Patient will complete functional transfers with sliding board with Mod assist +2 to increase safety and independence.  Plan      Co-evaluation                 AM-PAC OT "6 Clicks" Daily Activity  Outcome Measure   Help from another person eating meals?: A Little Help from another person taking care of personal grooming?: A Little Help from another person toileting, which includes using toliet, bedpan, or urinal?: Total Help from another person bathing (including washing, rinsing, drying)?: A Lot Help from another person to put on and taking off regular upper body clothing?: A Little Help from another person to put on and taking off regular lower body clothing?: A Lot 6 Click Score: 14    End of Session    OT Visit Diagnosis: Other abnormalities of gait and mobility (R26.89);Other symptoms and signs involving the nervous system (R29.898);Hemiplegia and hemiparesis;Muscle weakness (generalized) (M62.81);Other symptoms and signs involving cognitive function Hemiplegia - Right/Left: Left Hemiplegia - dominant/non-dominant: Non-Dominant Hemiplegia - caused by: Cerebral infarction   Activity Tolerance Patient tolerated  treatment well   Patient Left in bed;with call bell/phone within reach;with bed alarm set   Nurse Communication Mobility status        Time: 3329-5188 OT Time Calculation (min): 23 min  Charges: OT General Charges $OT Visit: 1 Visit OT Treatments $Self Care/Home Management : 8-22 mins $Therapeutic Activity: 8-22 mins  Alfonse Flavors, OTA Acute Rehabilitation Services  Office [phone]   Dewain Penning 10/19/2023, 12:54 PM

## 2023-10-19 NOTE — Progress Notes (Addendum)
Received patient in bed to unit.  Alert and oriented.  Informed consent signed and in chart.   TX duration: 3.5  Patient did not tolerated tx well. His bp was soft the whole treatment no fluid removed. Susann Givens, PA aware.  Transported back to the room  Alert, without acute distress.  Hand-off given to patient's nurse.   Access used: left femoral hd catheter Access issues: none  Total UF removed: - Medication(s) given: vancomycin, album 25% x2, midodrine    10/19/23 1617  Vitals  Temp 98.3 F (36.8 C)  Temp Source Oral  BP (!) 99/49  MAP (mmHg) (!) 64  BP Location Right Leg  BP Method Automatic  Patient Position (if appropriate) Lying  Pulse Rate (!) 131  Pulse Rate Source Monitor  ECG Heart Rate 90  Resp (!) 26  Oxygen Therapy  SpO2 97 %  O2 Device Room Air  During Treatment Monitoring  Blood Flow Rate (mL/min) 199 mL/min  Arterial Pressure (mmHg) -79.19 mmHg  Venous Pressure (mmHg) 109.29 mmHg  TMP (mmHg) 6.26 mmHg  Ultrafiltration Rate (mL/min) 0 mL/min  Dialysate Flow Rate (mL/min) 300 ml/min  Dialysate Potassium Concentration 2  Dialysate Calcium Concentration 2.5  Duration of HD Treatment -hour(s) 3.5 hour(s)  Cumulative Fluid Removed (mL) per Treatment  -779.96  HD Safety Checks Performed Yes  Intra-Hemodialysis Comments Tx completed  Dialysis Fluid Bolus Normal Saline  Bolus Amount (mL) 300 mL      Tarren Velardi S [facility]

## 2023-10-20 DIAGNOSIS — I330 Acute and subacute infective endocarditis: Secondary | ICD-10-CM | POA: Diagnosis not present

## 2023-10-20 DIAGNOSIS — Z992 Dependence on renal dialysis: Secondary | ICD-10-CM | POA: Diagnosis not present

## 2023-10-20 DIAGNOSIS — B9562 Methicillin resistant Staphylococcus aureus infection as the cause of diseases classified elsewhere: Secondary | ICD-10-CM | POA: Diagnosis not present

## 2023-10-20 DIAGNOSIS — D631 Anemia in chronic kidney disease: Secondary | ICD-10-CM | POA: Diagnosis not present

## 2023-10-20 DIAGNOSIS — I120 Hypertensive chronic kidney disease with stage 5 chronic kidney disease or end stage renal disease: Secondary | ICD-10-CM | POA: Diagnosis not present

## 2023-10-20 DIAGNOSIS — N186 End stage renal disease: Secondary | ICD-10-CM | POA: Diagnosis not present

## 2023-10-20 DIAGNOSIS — N250 Renal osteodystrophy: Secondary | ICD-10-CM | POA: Diagnosis not present

## 2023-10-20 DIAGNOSIS — I38 Endocarditis, valve unspecified: Secondary | ICD-10-CM | POA: Diagnosis not present

## 2023-10-20 DIAGNOSIS — R7881 Bacteremia: Secondary | ICD-10-CM | POA: Diagnosis not present

## 2023-10-20 LAB — GLUCOSE, CAPILLARY
Glucose-Capillary: 117 mg/dL — ABNORMAL HIGH (ref 70–99)
Glucose-Capillary: 141 mg/dL — ABNORMAL HIGH (ref 70–99)
Glucose-Capillary: 142 mg/dL — ABNORMAL HIGH (ref 70–99)
Glucose-Capillary: 142 mg/dL — ABNORMAL HIGH (ref 70–99)
Glucose-Capillary: 126 mg/dL — ABNORMAL HIGH (ref 70–99)

## 2023-10-20 LAB — VANCOMYCIN, RANDOM: Vancomycin Rm: 18 ug/mL

## 2023-10-20 NOTE — Progress Notes (Signed)
PROGRESS NOTE    [name]  SAY:[id number] DOB: 12-Nov-1980 DOA: 09/09/2023 PCP: [name], NP   Brief Narrative:  43 y.o. male with medical history significant of ESRD on hemodialysis (not fully compliant), insulin-dependent type 1 diabetes, gastroparesis, Left AKA, admitted to [facility] on 07/09/23 for DKA, Sepsis and hospital course significant for MV AND TV  MRSA endocarditis, followed by acute MCA stroke , left occipital and bilateral cerebellar strokes consistent with septic emboli, brain abscess from MRSA, left IJ DVT was transferred to [facility] for MV repair on 11/29.  While at [facility] he underwent cardiac cath showing non obstructive disease, craniotomy with abscess drainage on 09/01/23,. He had subarachnoid hemorrhage while on IV heparin, right third toe distal necrosis,was seen by podiatry, but family wanted conservative management. Since his tertiary care needs have been completed, he was transferred back to [facility] for further management.  PTOT  recommending SNF and awaiting placement.   Assessment & Plan:    MRSA TV/MV endocarditis: 10/12, blood cultures positive for MRSA. 10/13, TTE showed LVEF of 25-30% with concern for AV/MV abnormality  10/16 and 10/22, TEE showed MV/TV vegetations.  Currently on IV vancomycin.  Per ID recommendation: complete 8 weeks of treatment EOT 1/29. Recommend outpatient follow up with ID.    Hypotension: Resolved.  Blood pressure intermittently on the higher side. Currently on midodrine.    ESRD on HD:  Nephrology on board.  Continue hemodialysis as per nephrology.   Acute CVA /acute metabolic encephalopathy: Mri brain on 10/17 showed large right MCA infarct.  11/25, MRA head showed significant for large necrotic appearing lesion within the right frontal lobe within area of the prior right MCA territory infarct with vasogenic edema and 8 mm leftward midline shift.  Neurology recommendations for Eliquis.  PT/OT recommendations for SNF. Repeat  MRI brain done for increased confusion. Showed decreased size of area of abnormal diffusion restriction within the posterior right frontal operculum compatible with reported abscess. The amount of surrounding edema has also decreased -Neurology has already signed off: Outpatient follow-up with neurology.   Cerebral emboli with abscess/ Cerebritis -Secondary to to MRSA Endocarditis.  -S/p craniotomy and abscess drainage.    Type 1 DM uncontrolled with hyperglycemia: -Continue Semglee.  Continue CBGs with SSI.  Carb modified diet.   Acute left upper extremity DVT: -Continue Eliquis.  -Left upper extremity venous duplex showed age-indeterminate DVT involving the left axillary vein.   Anemia of chronic kidney disease: -Hemoglobin currently stable.  Monitor intermittently   Right third toe necrosis: H/o prior left BKA: Physical deconditioning Per documentation, patient/family opted for conservative management and declined surgical management. Patient was seen by PT/OT with recommendation for SNF on discharge.   Prostate fluid collection: Urology evaluated patient at [facility], suggested that its a dilated seminal vesicle and recommended for no surgical intervention.    Mild cognitive dysfunction with behavioral changes.  Currently stable on seroquel.   Obesity -Outpatient follow-up   DVT prophylaxis: Eliquis Code Status: Full Family Communication: None at bedside Disposition Plan: Status is: Inpatient Remains inpatient appropriate because: Of severity of illness.  Need for SNF placement.  Currently medically stable for discharge    Consultants: Nephrology/psychiatry/ID  Procedures: As above  Antimicrobials:  Anti-infectives (From admission, onward)    Start     Dose/Rate Route Frequency Ordered Stop   10/16/23 1345  vancomycin (VANCOCIN) 750 mg in sodium chloride 0.9 % 250 mL IVPB        750 mg 265 mL/hr over 60  Minutes Intravenous Every T-Th-Sa (Hemodialysis) 10/16/23  1257 10/28/23 1159   10/16/23 1200  vancomycin (VANCOREADY) IVPB 750 mg/150 mL  Status:  Discontinued        750 mg 150 mL/hr over 60 Minutes Intravenous Every T-Th-Sa (Hemodialysis) 10/14/23 1843 10/16/23 1257   10/14/23 1845  vancomycin (VANCOREADY) IVPB 500 mg/100 mL        500 mg 100 mL/hr over 60 Minutes Intravenous  Once 10/14/23 1843 10/15/23 0806   10/14/23 1200  vancomycin (VANCOREADY) IVPB 500 mg/100 mL  Status:  Discontinued        500 mg 100 mL/hr over 60 Minutes Intravenous Every T-Th-Sa (Hemodialysis) 10/13/23 1309 10/14/23 1843   10/13/23 1200  vancomycin (VANCOREADY) IVPB 500 mg/100 mL  Status:  Discontinued        500 mg 100 mL/hr over 60 Minutes Intravenous Every M-W-F (Hemodialysis) 10/11/23 1517 10/13/23 1309   10/12/23 1200  vancomycin (VANCOREADY) IVPB 500 mg/100 mL  Status:  Discontinued        500 mg 100 mL/hr over 60 Minutes Intravenous Every T-Th-Sa (Hemodialysis) 10/11/23 1514 10/11/23 1517   09/24/23 1200  vancomycin (VANCOREADY) IVPB 500 mg/100 mL  Status:  Discontinued        500 mg 100 mL/hr over 60 Minutes Intravenous Every M-W-F (Hemodialysis) 09/21/23 1246 10/11/23 1514   09/20/23 1200  vancomycin (VANCOCIN) 750 mg in sodium chloride 0.9 % 250 mL IVPB  Status:  Discontinued        750 mg 265 mL/hr over 60 Minutes Intravenous Every M-W-F (Hemodialysis) 09/20/23 0446 09/21/23 1246   09/20/23 1045  vancomycin (VANCOCIN) 750 mg in sodium chloride 0.9 % 250 mL IVPB        750 mg 265 mL/hr over 60 Minutes Intravenous  Once 09/20/23 0959 09/20/23 1226   09/20/23 0945  vancomycin (VANCOREADY) IVPB 750 mg/150 mL  Status:  Discontinued        750 mg 150 mL/hr over 60 Minutes Intravenous  Once 09/20/23 0855 09/20/23 0959   09/10/23 1200  vancomycin (VANCOREADY) IVPB 750 mg/150 mL  Status:  Discontinued        750 mg 150 mL/hr over 60 Minutes Intravenous Every M-W-F (Hemodialysis) 09/09/23 2030 09/20/23 0446   09/09/23 2200  linezolid (ZYVOX) tablet 600 mg   Status:  Discontinued        600 mg Oral Every 12 hours 09/09/23 2035 09/10/23 0946        Subjective: Patient seen and examined at bedside.  No agitation, seizures, vomiting reported.  Objective: Vitals:   10/19/23 2214 10/20/23 0153 10/20/23 0658 10/20/23 0714  BP: (!) 140/107 112/83 (!) 140/93   Pulse: (!) 121 (!) 59 (!) 126 76  Resp: 19 18 18    Temp: 98.2 F (36.8 C) 98.5 F (36.9 C) 98.4 F (36.9 C)   TempSrc:  Oral    SpO2: 95% 92% (!) 88% 91%  Weight:      Height:        Intake/Output Summary (Last 24 hours) at 10/20/2023 0815 Last data filed at 10/20/2023 0734 Gross per 24 hour  Intake --  Output 0 ml  Net 0 ml   Filed Weights   10/16/23 1939 10/19/23 1229 10/19/23 1626  Weight: 91.2 kg 95.1 kg 96.3 kg    Examination:  General exam: Appears calm and comfortable.  On room air.  Poor historian. Respiratory system: Bilateral decreased breath sounds at bases with some scattered crackles Cardiovascular system: S1 & S2 heard, mild intermittent bradycardia  and tachycardia present Gastrointestinal system: Abdomen is nondistended, soft and nontender. Normal bowel sounds heard. Extremities: No cyanosis, clubbing; left BKA present.  Left upper extremity swollen Central nervous system: Awake, poor historian.  Slow to respond.  No focal neurological deficits. Moving extremities Skin: No rashes, lesions or ulcers Psychiatry: Flat affect.  Not agitated.    Data Reviewed: I have personally reviewed following labs and imaging studies  CBC: Recent Labs  Lab 10/14/23 1504 10/16/23 0411 10/17/23 1121 10/19/23 1035  WBC 5.7 5.6 6.3 5.8  NEUTROABS  --   --  3.8  --   HGB 8.5* 8.5* 9.5* 10.1*  HCT 29.6* 28.6* 31.5* 33.6*  MCV 96.4 93.5 91.8 92.1  PLT 156 147* 148* 179   Basic Metabolic Panel: Recent Labs  Lab 10/14/23 1504 10/16/23 0411 10/16/23 2119 10/17/23 1121 10/19/23 1035 10/19/23 1740  NA 136 135  --  136 137 137  K 5.9* 6.2* 4.4 4.9 5.2* 4.5  CL  94* 94*  --  96* 94* 95*  CO2 26 24  --  25 24 25   GLUCOSE 289* 281*  --  137* 75 143*  BUN 62* 52*  --  38* 56* 29*  CREATININE 7.73* 6.77*  --  5.72* 7.67* 4.33*  CALCIUM 9.1 9.3  --  9.7 9.7 9.3  MG 2.5* 2.3  --   --   --   --   PHOS 9.8* 8.3*  --   --  7.9*  --    GFR: Estimated Creatinine Clearance: 24.1 mL/min (A) (by C-G formula based on SCr of 4.33 mg/dL (H)). Liver Function Tests: Recent Labs  Lab 10/19/23 1035 10/19/23 1740  AST  --  19  20  ALT  --  8  10  ALKPHOS  --  150*  150*  BILITOT  --  1.3*  1.3*  PROT  --  7.8  7.7  ALBUMIN 3.6 3.8  3.9   No results for input(s): "LIPASE", "AMYLASE" in the last 168 hours. No results for input(s): "AMMONIA" in the last 168 hours. Coagulation Profile: No results for input(s): "INR", "PROTIME" in the last 168 hours. Cardiac Enzymes: No results for input(s): "CKTOTAL", "CKMB", "CKMBINDEX", "TROPONINI" in the last 168 hours. BNP (last 3 results) No results for input(s): "PROBNP" in the last 8760 hours. HbA1C: No results for input(s): "HGBA1C" in the last 72 hours. CBG: Recent Labs  Lab 10/19/23 0757 10/19/23 1705 10/19/23 2216 10/20/23 0127 10/20/23 0801  GLUCAP 78 153* 151* 117* 141*   Lipid Profile: No results for input(s): "CHOL", "HDL", "LDLCALC", "TRIG", "CHOLHDL", "LDLDIRECT" in the last 72 hours. Thyroid Function Tests: No results for input(s): "TSH", "T4TOTAL", "FREET4", "T3FREE", "THYROIDAB" in the last 72 hours. Anemia Panel: No results for input(s): "VITAMINB12", "FOLATE", "FERRITIN", "TIBC", "IRON", "RETICCTPCT" in the last 72 hours. Sepsis Labs: No results for input(s): "PROCALCITON", "LATICACIDVEN" in the last 168 hours.  No results found for this or any previous visit (from the past 240 hours).       Radiology Studies: No results found.      Scheduled Meds:  (feeding supplement) PROSource Plus  30 mL Oral BID BM   apixaban  5 mg Oral BID   bacitracin   Topical Daily    Chlorhexidine Gluconate Cloth  6 each Topical Q0600   darbepoetin (ARANESP) injection - DIALYSIS  100 mcg Subcutaneous Q Sun-1800   dextrose  1 Tube Oral Once   dextrose  25 mL Intravenous Once   gabapentin  100  mg Oral TID   Gerhardt's butt cream   Topical BID   influenza vac split trivalent PF  0.5 mL Intramuscular Tomorrow-1000   insulin aspart  0-5 Units Subcutaneous QHS   insulin aspart  0-6 Units Subcutaneous TID WC   insulin glargine-yfgn  8 Units Subcutaneous Daily   loratadine  10 mg Oral Daily   midodrine  10 mg Oral Q M,W,F-HD   pantoprazole  40 mg Oral Daily   povidone-Iodine   Topical Q0600   QUEtiapine  12.5 mg Oral q morning   QUEtiapine  25 mg Oral QHS   senna-docusate  2 tablet Oral BID   sevelamer carbonate  2,400 mg Oral TID WC   sodium zirconium cyclosilicate  10 g Oral TID   Continuous Infusions:  albumin human 25 g (10/19/23 1304)   vancomycin Stopped (10/19/23 1706)          Glade Lloyd, MD [facility] 10/20/2023, 8:15 AM

## 2023-10-20 NOTE — Progress Notes (Signed)
Additional labs faxed to Kim with [facility] out-pt HD. Referral to [facility] for [facility] clinic is still pending. Will assist as needed.   Olivia Canter Renal Navigator [phone]

## 2023-10-20 NOTE — Plan of Care (Signed)
  Problem: Health Behavior/Discharge Planning: Goal: Ability to manage health-related needs will improve Outcome: Progressing   Problem: Clinical Measurements: Goal: Ability to maintain clinical measurements within normal limits will improve Outcome: Progressing Goal: Will remain free from infection Outcome: Progressing Goal: Diagnostic test results will improve Outcome: Progressing Goal: Cardiovascular complication will be avoided Outcome: Progressing   Problem: Activity: Goal: Risk for activity intolerance will decrease Outcome: Progressing   Problem: Coping: Goal: Level of anxiety will decrease Outcome: Progressing   Problem: Skin Integrity: Goal: Risk for impaired skin integrity will decrease Outcome: Progressing   Problem: Education: Goal: Ability to describe self-care measures that may prevent or decrease complications (Diabetes Survival Skills Education) will improve Outcome: Progressing Goal: Individualized Educational Video(s) Outcome: Progressing   Problem: Coping: Goal: Ability to adjust to condition or change in health will improve Outcome: Progressing   Problem: Fluid Volume: Goal: Ability to maintain a balanced intake and output will improve Outcome: Progressing   Problem: Health Behavior/Discharge Planning: Goal: Ability to identify and utilize available resources and services will improve Outcome: Progressing Goal: Ability to manage health-related needs will improve Outcome: Progressing   Problem: Metabolic: Goal: Ability to maintain appropriate glucose levels will improve Outcome: Progressing   Problem: Skin Integrity: Goal: Risk for impaired skin integrity will decrease Outcome: Progressing   Problem: Tissue Perfusion: Goal: Adequacy of tissue perfusion will improve Outcome: Progressing   Problem: Education: Goal: Knowledge of disease and its progression will improve Outcome: Progressing Goal: Individualized Educational Video(s) Outcome:  Progressing   Problem: Fluid Volume: Goal: Compliance with measures to maintain balanced fluid volume will improve Outcome: Progressing   Problem: Health Behavior/Discharge Planning: Goal: Ability to manage health-related needs will improve Outcome: Progressing   Problem: Nutritional: Goal: Ability to make healthy dietary choices will improve Outcome: Progressing   Problem: Clinical Measurements: Goal: Complications related to the disease process, condition or treatment will be avoided or minimized Outcome: Progressing

## 2023-10-20 NOTE — Progress Notes (Signed)
Pharmacy Antibiotic Note  [name] is a 43 y.o. 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 Pt was transferred to [facility] for MV repair 11/29 and craniotomy with abscess drainage on 12/4. Pharmacy has been consulted for continued Vancomycin dosing.  ESRD- HD changed from MWF to TTS schedule.  HD done yesterday 1/21.   Vancomycin level  random this morning - 18 therapeutic   Plan: Continue maintenance Vancomycin dose at 750 mg IV qHD- TTS thru 10/28/23. Will monitor HD tolerance and dosing, weekly pre HD Vanc level.    Height: 5\' 7"  (170.2 cm) Weight: 96.3 kg (212 lb 4.9 oz) (bed) IBW/kg (Calculated) : 66.1  Temp (24hrs), Avg:98.3 F (36.8 C), Min:97.9 F (36.6 C), Max:98.5 F (36.9 C)  Recent Labs  Lab 10/14/23 1504 10/14/23 1817 10/16/23 0411 10/17/23 1121 10/19/23 1035 10/19/23 1740 10/20/23 0425  WBC 5.7  --  5.6 6.3 5.8  --   --   CREATININE 7.73*  --  6.77* 5.72* 7.67* 4.33*  --   VANCORANDOM  --  12  --   --   --   --  18    Estimated Creatinine Clearance: 24.1 mL/min (A) (by C-G formula based on SCr of 4.33 mg/dL (H)).    No Known Allergies  Antimicrobials this admission: Vancomycin 10/12 >>(10/28/23) Cefepime 10/21 >> 10/23; restart 10/25 >> 10/29 Linezolid 11/27 >> 12/10   Vancomycin levels  -10/17 preHD>>25 -10/24: VR= 16 -11/1: VR= 24 -11/8 VR 22 -11/15 VR 22  -11/22 VR 28 - held dose x1, decrease to 500mg  -11/28 VR 18 - cont 500mg  MWF 11/29 - Transferred to [facility] -12/11 VR 19.8 (at [facility]) - mostly 750mg  with HD (occasionally 500mg  instead) - 12/16 VR 25 mcg/mL- continue 750 mg iv MWF - 12/24 VR 27 mcg/mL- hold 1 dose / reduce to 500 mg iv MWF thereafter - 10/01/23 VR 15 - therapeutic  -10/14/23 VR 12 - subtherapeutic on 500 qHD TTS> increase to 750mg  qHD TTS. - 10/20/23 VR 18 therapeutic   Microbiology results: 10/12 MRSA PCR >> positive 10/12 CDiff antigen +, toxin -, PCR - >> negative 10/12  BCx >> 4/4 MRSA 10/13 BCx >> Negative 10/17 BCx >> Negative 10/21 RCx >> Negative 11/26 Bcx >> negative 11/27 Bcx >> Negative   Ruben Im, PharmD Clinical Pharmacist 10/20/2023 9:09 AM Please check AMION for all [facility] Pharmacy numbers

## 2023-10-20 NOTE — Progress Notes (Signed)
[facility] Progress Note   Subjective:    Low BP on HD yesterday - didn't tolerate much UF Seen in room. Comfortable. No cp/sob.    Objective Vitals:   10/20/23 0153 10/20/23 0658 10/20/23 0714 10/20/23 0851  BP: 112/83 (!) 140/93  118/83  Pulse: (!) 59 (!) 126 76 (!) 125  Resp: 18 18  20   Temp: 98.5 F (36.9 C) 98.4 F (36.9 C)  98.5 F (36.9 C)  TempSrc: Oral   Oral  SpO2: 92% (!) 88% 91% 100%  Weight:      Height:       Physical Exam General: Chronically ill appearing man, NAD. Room air Heart: RRR; 2/6 murmur Lungs: CTA anteriorly Abdomen: soft Extremities: 1-2+ BLE and LUE edema Dialysis Access: L femoral TDC  Additional Objective Labs: Basic Metabolic Panel: Recent Labs  Lab 10/14/23 1504 10/16/23 0411 10/16/23 2119 10/17/23 1121 10/19/23 1035 10/19/23 1740  NA 136 135  --  136 137 137  K 5.9* 6.2*   < > 4.9 5.2* 4.5  CL 94* 94*  --  96* 94* 95*  CO2 26 24  --  25 24 25   GLUCOSE 289* 281*  --  137* 75 143*  BUN 62* 52*  --  38* 56* 29*  CREATININE 7.73* 6.77*  --  5.72* 7.67* 4.33*  CALCIUM 9.1 9.3  --  9.7 9.7 9.3  PHOS 9.8* 8.3*  --   --  7.9*  --    < > = values in this interval not displayed.   CBC: Recent Labs  Lab 10/14/23 1504 10/16/23 0411 10/17/23 1121 10/19/23 1035  WBC 5.7 5.6 6.3 5.8  NEUTROABS  --   --  3.8  --   HGB 8.5* 8.5* 9.5* 10.1*  HCT 29.6* 28.6* 31.5* 33.6*  MCV 96.4 93.5 91.8 92.1  PLT 156 147* 148* 179   CBG: Recent Labs  Lab 10/19/23 0757 10/19/23 1705 10/19/23 2216 10/20/23 0127 10/20/23 0801  GLUCAP 78 153* 151* 117* 141*   Studies/Results: No results found.  Medications:  albumin human 25 g (10/19/23 1304)   vancomycin Stopped (10/19/23 1706)    (feeding supplement) PROSource Plus  30 mL Oral BID BM   apixaban  5 mg Oral BID   bacitracin   Topical Daily   Chlorhexidine Gluconate Cloth  6 each Topical Q0600   darbepoetin (ARANESP) injection - DIALYSIS  100 mcg Subcutaneous Q  Sun-1800   dextrose  1 Tube Oral Once   dextrose  25 mL Intravenous Once   gabapentin  100 mg Oral TID   Gerhardt's butt cream   Topical BID   influenza vac split trivalent PF  0.5 mL Intramuscular Tomorrow-1000   insulin aspart  0-5 Units Subcutaneous QHS   insulin aspart  0-6 Units Subcutaneous TID WC   insulin glargine-yfgn  8 Units Subcutaneous Daily   loratadine  10 mg Oral Daily   midodrine  10 mg Oral Q M,W,F-HD   pantoprazole  40 mg Oral Daily   povidone-Iodine   Topical Q0600   QUEtiapine  12.5 mg Oral q morning   QUEtiapine  25 mg Oral QHS   senna-docusate  2 tablet Oral BID   sevelamer carbonate  2,400 mg Oral TID WC   sodium zirconium cyclosilicate  10 g Oral TID    Dialysis Orders MWF - [facility] -> will be going to [place] TTS on discharge 4hr, 400/A1.5, EDW 77kg, L femoral TDC, 2K/3Ca bath, no heparin   Assessment/Plan:  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 Continues on extended Vanc course - will be on Vanc IV q HD until 10/27/23. MRSA brain abscess: S/p transfer to [facility] and drainage 08/31/24. Repeat brain MRI 10/17/23 showed improvement. Acute B CVA/septic emboli Acute LUE DVT: On Eliquis ESRD: Transitioned to TTS, next HD 1/23.  HTN/volume: BP stable, CT with diffuse anasarca. Significantly above EDW. Max UF with HD. 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 Last 1/19.  No IV iron while on abx. Secondary HPTH: CorrCa/Phos high. Continue to hold calcitriol. Sevelamer increased to 3/meals. Hyperkalemia: Persistently high despite Lokelma 10g TID, using 1K bath occ with HD. Nutrition: Alb low, continue supplements.  T1DM: per primary Dispo: SNF being arranged - not finalized yet    Tomasa Blase PA-C [facility] 10/20/2023,12:05 PM

## 2023-10-20 NOTE — TOC Progression Note (Signed)
Transition of Care [facility]) - Progression Note    Patient Details  Name: [name] MRN: [id number] Date of Birth: 01-Jan-1981  [facility] CM/SW Contact  Michaela Corner, [place] Phone Number: 10/20/2023, 4:31 PM  Clinical Narrative:   CSW received request from RN to contact pt regarding placement. CSW spoke with pt and explained that at this time [name] is assessing his behaviors to see about appropriateness to admit him to their facility. Pt asked for CSW to update his mom.   CSW contacted pts mother, [name], to update her on disposition. CSW explained that [name] is assessing pts behaviors at this time. Ms. [name] gave her verbal understanding and stated inquired about the other bed offers pt had received when SNF workup was done. CSW informed Ms. [name] that the other facilities are still listed as 'accepted' within the HUB. Ms. [name] stated [name] is the pt and family's preference at this time.   TOC will continue to follow.    Expected Discharge Plan: Skilled Nursing Facility Barriers to Discharge: Continued Medical Work up  Expected Discharge Plan and Services                                               Social Determinants of Health (SDOH) Interventions SDOH Screenings   Food Insecurity: No Food Insecurity (09/09/2023)  Housing: Unknown (09/09/2023)  Transportation Needs: No Transportation Needs (09/09/2023)  Utilities: Not At Risk (09/09/2023)  Alcohol Screen: Low Risk  (02/15/2023)  Depression (PHQ2-9): Low Risk  (02/15/2023)  Financial Resource Strain: Low Risk  (02/15/2023)  Physical Activity: Inactive (02/15/2023)  Social Connections: Socially Integrated (02/15/2023)  Stress: No Stress Concern Present (02/15/2023)  Tobacco Use: Low Risk  (09/12/2023)  Recent Concern: Tobacco Use - Medium Risk (09/01/2023)   Received from [facility]    Readmission Risk Interventions     No data to display

## 2023-10-21 DIAGNOSIS — R7881 Bacteremia: Secondary | ICD-10-CM | POA: Diagnosis not present

## 2023-10-21 DIAGNOSIS — Z992 Dependence on renal dialysis: Secondary | ICD-10-CM | POA: Diagnosis not present

## 2023-10-21 DIAGNOSIS — I38 Endocarditis, valve unspecified: Secondary | ICD-10-CM | POA: Diagnosis not present

## 2023-10-21 DIAGNOSIS — I120 Hypertensive chronic kidney disease with stage 5 chronic kidney disease or end stage renal disease: Secondary | ICD-10-CM | POA: Diagnosis not present

## 2023-10-21 DIAGNOSIS — N186 End stage renal disease: Secondary | ICD-10-CM | POA: Diagnosis not present

## 2023-10-21 DIAGNOSIS — N250 Renal osteodystrophy: Secondary | ICD-10-CM | POA: Diagnosis not present

## 2023-10-21 DIAGNOSIS — I330 Acute and subacute infective endocarditis: Secondary | ICD-10-CM | POA: Diagnosis not present

## 2023-10-21 DIAGNOSIS — B9562 Methicillin resistant Staphylococcus aureus infection as the cause of diseases classified elsewhere: Secondary | ICD-10-CM | POA: Diagnosis not present

## 2023-10-21 DIAGNOSIS — D631 Anemia in chronic kidney disease: Secondary | ICD-10-CM | POA: Diagnosis not present

## 2023-10-21 LAB — GLUCOSE, CAPILLARY
Glucose-Capillary: 46 mg/dL — ABNORMAL LOW (ref 70–99)
Glucose-Capillary: 105 mg/dL — ABNORMAL HIGH (ref 70–99)
Glucose-Capillary: 41 mg/dL — CL (ref 70–99)
Glucose-Capillary: 147 mg/dL — ABNORMAL HIGH (ref 70–99)
Glucose-Capillary: 235 mg/dL — ABNORMAL HIGH (ref 70–99)

## 2023-10-21 MED ORDER — INSULIN GLARGINE-YFGN 100 UNIT/ML ~~LOC~~ SOLN
6.0000 [IU] | Freq: Every day | SUBCUTANEOUS | Status: DC
  Filled 2023-10-21 (×2): qty 0.06

## 2023-10-21 MED ORDER — VANCOMYCIN HCL 750 MG IV SOLR
750.0000 mg | INTRAVENOUS | Status: AC
  Administered 2023-10-21 – 2023-10-27 (×3): 750 mg via INTRAVENOUS
  Filled 2023-10-21 (×4): qty 15

## 2023-10-21 MED ORDER — VANCOMYCIN HCL 1000 MG IV SOLR: 750.0000 mg | INTRAVENOUS | Status: DC

## 2023-10-21 MED ORDER — HEPARIN SODIUM (PORCINE) 1000 UNIT/ML IJ SOLN
INTRAMUSCULAR | Status: AC
  Filled 2023-10-21: qty 4

## 2023-10-21 MED ORDER — DEXTROSE 50 % IV SOLN
INTRAVENOUS | Status: AC
  Filled 2023-10-21: qty 50

## 2023-10-21 NOTE — Inpatient Diabetes Management (Signed)
Inpatient Diabetes Program Recommendations  AACE/ADA: New Consensus Statement on Inpatient Glycemic Control (2015)  Target Ranges:  Prepandial:   less than 140 mg/dL      Peak postprandial:   less than 180 mg/dL (1-2 hours)      Critically ill patients:  140 - 180 mg/dL   Lab Results  Component Value Date   GLUCAP 147 (H) 10/21/2023   HGBA1C 8.9 (H) 07/10/2023    Review of Glycemic Control  Latest Reference Range & Units 10/20/23 16:03 10/20/23 21:59 10/21/23 07:26 10/21/23 07:43 10/21/23 08:15 10/21/23 11:32  Glucose-Capillary 70 - 99 mg/dL 161 (H) 096 (H) 46 (L) 41 (LL) 105 (H) 147 (H)  (LL): Data is critically low (H): Data is abnormally high (L): Data is abnormally low  Diabetes history: DM1 Outpatient Diabetes medications: Lantus 4 daily, Humalog 0-4 TID Current orders for Inpatient glycemic control: Semglee 8 daily, Novolog 0-6 TID with meals & HS  Inpatient Diabetes Program Recommendations:   Noted hypoglycemia this AM and basal insulin discontinued. Consider adding Semglee 6 units every day Secure chat sent to MD.   Thanks, Lujean Rave, MSN, RNC-OB Diabetes Coordinator [phone] (8a-5p)

## 2023-10-21 NOTE — Progress Notes (Addendum)
PROGRESS NOTE    [name]  JYN:[id number] DOB: [birth year] DOA: 09/09/2023 PCP: [name], NP   Brief Narrative:  43 y.o. male with medical history significant of ESRD on hemodialysis (not fully compliant), insulin-dependent type 1 diabetes, gastroparesis, Left AKA, admitted to [facility] on 07/09/23 for DKA, Sepsis and hospital course significant for MV AND TV  MRSA endocarditis, followed by acute MCA stroke , left occipital and bilateral cerebellar strokes consistent with septic emboli, brain abscess from MRSA, left IJ DVT was transferred to [facility] for MV repair on 11/29.  While at [facility] he underwent cardiac cath showing non obstructive disease, craniotomy with abscess drainage on 09/01/23,. He had subarachnoid hemorrhage while on IV heparin, right third toe distal necrosis,was seen by podiatry, but family wanted conservative management. Since his tertiary care needs have been completed, he was transferred back to [name] for further management.  PTOT  recommending SNF and awaiting placement.   Assessment & Plan:    MRSA TV/MV endocarditis: 10/12, blood cultures positive for MRSA. 10/13, TTE showed LVEF of 25-30% with concern for AV/MV abnormality  10/16 and 10/22, TEE showed MV/TV vegetations.  Currently on IV vancomycin.  Per ID recommendation: complete 8 weeks of treatment EOT 1/29. Recommend outpatient follow up with ID.    Hypotension: Resolved.  Blood pressure intermittently on the higher side. Currently on midodrine.  Will DC midodrine.     ESRD on HD:  Nephrology on board.  Continue hemodialysis as per nephrology.   Acute CVA /acute metabolic encephalopathy: Mri brain on 10/17 showed large right MCA infarct.  11/25, MRA head showed significant for large necrotic appearing lesion within the right frontal lobe within area of the prior right MCA territory infarct with vasogenic edema and 8 mm leftward midline shift.  Neurology recommendations for Eliquis.  PT/OT  recommendations for SNF. Repeat MRI brain done for increased confusion. Showed decreased size of area of abnormal diffusion restriction within the posterior right frontal operculum compatible with reported abscess. The amount of surrounding edema has also decreased -Neurology has already signed off: Outpatient follow-up with neurology.   Cerebral emboli with abscess/ Cerebritis -Secondary to to MRSA Endocarditis.  -S/p craniotomy and abscess drainage.    Type 1 DM uncontrolled with hyperglycemia and hypoglycemia: -Had episodes of hypoglycemia this morning.  DC long-acting insulin.  Continue CBGs with SSI.  Carb modified diet.   Acute left upper extremity DVT: -Continue Eliquis.  -Left upper extremity venous duplex showed age-indeterminate DVT involving the left axillary vein.   Anemia of chronic kidney disease: -Hemoglobin currently stable.  Monitor intermittently   Right third toe necrosis: H/o prior left BKA: Physical deconditioning Per documentation, patient/family opted for conservative management and declined surgical management. Patient was seen by PT/OT with recommendation for SNF on discharge.   Prostate fluid collection: Urology evaluated patient at [facility], suggested that its a dilated seminal vesicle and recommended for no surgical intervention.    Mild cognitive dysfunction with behavioral changes.  Currently stable on seroquel.   Obesity -Outpatient follow-up   DVT prophylaxis: Eliquis Code Status: Full Family Communication: None at bedside Disposition Plan: Status is: Inpatient Remains inpatient appropriate because: Of severity of illness.  Need for SNF placement.  Currently medically stable for discharge    Consultants: Nephrology/psychiatry/ID  Procedures: As above  Antimicrobials:  Anti-infectives (From admission, onward)    Start     Dose/Rate Route Frequency Ordered Stop   10/16/23 1345  vancomycin (VANCOCIN) 750 mg in sodium chloride 0.9 % 250  mL  IVPB        750 mg 265 mL/hr over 60 Minutes Intravenous Every T-Th-Sa (Hemodialysis) 10/16/23 1257 10/28/23 1159   10/16/23 1200  vancomycin (VANCOREADY) IVPB 750 mg/150 mL  Status:  Discontinued        750 mg 150 mL/hr over 60 Minutes Intravenous Every T-Th-Sa (Hemodialysis) 10/14/23 1843 10/16/23 1257   10/14/23 1845  vancomycin (VANCOREADY) IVPB 500 mg/100 mL        500 mg 100 mL/hr over 60 Minutes Intravenous  Once 10/14/23 1843 10/15/23 0806   10/14/23 1200  vancomycin (VANCOREADY) IVPB 500 mg/100 mL  Status:  Discontinued        500 mg 100 mL/hr over 60 Minutes Intravenous Every T-Th-Sa (Hemodialysis) 10/13/23 1309 10/14/23 1843   10/13/23 1200  vancomycin (VANCOREADY) IVPB 500 mg/100 mL  Status:  Discontinued        500 mg 100 mL/hr over 60 Minutes Intravenous Every M-W-F (Hemodialysis) 10/11/23 1517 10/13/23 1309   10/12/23 1200  vancomycin (VANCOREADY) IVPB 500 mg/100 mL  Status:  Discontinued        500 mg 100 mL/hr over 60 Minutes Intravenous Every T-Th-Sa (Hemodialysis) 10/11/23 1514 10/11/23 1517   09/24/23 1200  vancomycin (VANCOREADY) IVPB 500 mg/100 mL  Status:  Discontinued        500 mg 100 mL/hr over 60 Minutes Intravenous Every M-W-F (Hemodialysis) 09/21/23 1246 10/11/23 1514   09/20/23 1200  vancomycin (VANCOCIN) 750 mg in sodium chloride 0.9 % 250 mL IVPB  Status:  Discontinued        750 mg 265 mL/hr over 60 Minutes Intravenous Every M-W-F (Hemodialysis) 09/20/23 0446 09/21/23 1246   09/20/23 1045  vancomycin (VANCOCIN) 750 mg in sodium chloride 0.9 % 250 mL IVPB        750 mg 265 mL/hr over 60 Minutes Intravenous  Once 09/20/23 0959 09/20/23 1226   09/20/23 0945  vancomycin (VANCOREADY) IVPB 750 mg/150 mL  Status:  Discontinued        750 mg 150 mL/hr over 60 Minutes Intravenous  Once 09/20/23 0855 09/20/23 0959   09/10/23 1200  vancomycin (VANCOREADY) IVPB 750 mg/150 mL  Status:  Discontinued        750 mg 150 mL/hr over 60 Minutes Intravenous Every M-W-F  (Hemodialysis) 09/09/23 2030 09/20/23 0446   09/09/23 2200  linezolid (ZYVOX) tablet 600 mg  Status:  Discontinued        600 mg Oral Every 12 hours 09/09/23 2035 09/10/23 0946        Subjective: Patient seen and examined at bedside.  No fever, agitation, vomiting reported. Objective: Vitals:   10/20/23 1607 10/20/23 2159 10/20/23 2357 10/21/23 0634  BP: (!) 134/40 (!) 153/92 (!) 138/104 (!) 141/93  Pulse:  (!) 111 (!) 112 (!) 114  Resp: 18 18 18 19   Temp: 99 F (37.2 C) 98.4 F (36.9 C) 98.3 F (36.8 C) 98.4 F (36.9 C)  TempSrc: Oral     SpO2: 93% 96% 95% 95%  Weight:      Height:        Intake/Output Summary (Last 24 hours) at 10/21/2023 0659 Last data filed at 10/21/2023 0003 Gross per 24 hour  Intake 480 ml  Output 0 ml  Net 480 ml   Filed Weights   10/16/23 1939 10/19/23 1229 10/19/23 1626  Weight: 91.2 kg 95.1 kg 96.3 kg    Examination:  General: Remains on room air.  No distress.  Chronically ill and deconditioned looking. respiratory:  Decreased breath sounds at bases bilaterally with some crackles CVS: Intermittently tachycardic; S1-S2 heard  abdominal: Soft, nontender, slightly distended, no organomegaly; bowel sounds are heard  extremities: Left BKA present.  Mild right lower extremity edema present.    Data Reviewed: I have personally reviewed following labs and imaging studies  CBC: Recent Labs  Lab 10/14/23 1504 10/16/23 0411 10/17/23 1121 10/19/23 1035  WBC 5.7 5.6 6.3 5.8  NEUTROABS  --   --  3.8  --   HGB 8.5* 8.5* 9.5* 10.1*  HCT 29.6* 28.6* 31.5* 33.6*  MCV 96.4 93.5 91.8 92.1  PLT 156 147* 148* 179   Basic Metabolic Panel: Recent Labs  Lab 10/14/23 1504 10/16/23 0411 10/16/23 2119 10/17/23 1121 10/19/23 1035 10/19/23 1740  NA 136 135  --  136 137 137  K 5.9* 6.2* 4.4 4.9 5.2* 4.5  CL 94* 94*  --  96* 94* 95*  CO2 26 24  --  25 24 25   GLUCOSE 289* 281*  --  137* 75 143*  BUN 62* 52*  --  38* 56* 29*  CREATININE 7.73*  6.77*  --  5.72* 7.67* 4.33*  CALCIUM 9.1 9.3  --  9.7 9.7 9.3  MG 2.5* 2.3  --   --   --   --   PHOS 9.8* 8.3*  --   --  7.9*  --    GFR: Estimated Creatinine Clearance: 24.1 mL/min (A) (by C-G formula based on SCr of 4.33 mg/dL (H)). Liver Function Tests: Recent Labs  Lab 10/19/23 1035 10/19/23 1740  AST  --  19  20  ALT  --  8  10  ALKPHOS  --  150*  150*  BILITOT  --  1.3*  1.3*  PROT  --  7.8  7.7  ALBUMIN 3.6 3.8  3.9   No results for input(s): "LIPASE", "AMYLASE" in the last 168 hours. No results for input(s): "AMMONIA" in the last 168 hours. Coagulation Profile: No results for input(s): "INR", "PROTIME" in the last 168 hours. Cardiac Enzymes: No results for input(s): "CKTOTAL", "CKMB", "CKMBINDEX", "TROPONINI" in the last 168 hours. BNP (last 3 results) No results for input(s): "PROBNP" in the last 8760 hours. HbA1C: No results for input(s): "HGBA1C" in the last 72 hours. CBG: Recent Labs  Lab 10/20/23 0127 10/20/23 0801 10/20/23 1142 10/20/23 1603 10/20/23 2159  GLUCAP 117* 141* 142* 142* 126*   Lipid Profile: No results for input(s): "CHOL", "HDL", "LDLCALC", "TRIG", "CHOLHDL", "LDLDIRECT" in the last 72 hours. Thyroid Function Tests: No results for input(s): "TSH", "T4TOTAL", "FREET4", "T3FREE", "THYROIDAB" in the last 72 hours. Anemia Panel: No results for input(s): "VITAMINB12", "FOLATE", "FERRITIN", "TIBC", "IRON", "RETICCTPCT" in the last 72 hours. Sepsis Labs: No results for input(s): "PROCALCITON", "LATICACIDVEN" in the last 168 hours.  No results found for this or any previous visit (from the past 240 hours).       Radiology Studies: No results found.      Scheduled Meds:  (feeding supplement) PROSource Plus  30 mL Oral BID BM   apixaban  5 mg Oral BID   bacitracin   Topical Daily   Chlorhexidine Gluconate Cloth  6 each Topical Q0600   darbepoetin (ARANESP) injection - DIALYSIS  100 mcg Subcutaneous Q Sun-1800   dextrose  1  Tube Oral Once   dextrose  25 mL Intravenous Once   gabapentin  100 mg Oral TID   Gerhardt's butt cream   Topical BID   influenza vac split trivalent  PF  0.5 mL Intramuscular Tomorrow-1000   insulin aspart  0-5 Units Subcutaneous QHS   insulin aspart  0-6 Units Subcutaneous TID WC   insulin glargine-yfgn  8 Units Subcutaneous Daily   loratadine  10 mg Oral Daily   midodrine  10 mg Oral Q M,W,F-HD   pantoprazole  40 mg Oral Daily   povidone-Iodine   Topical Q0600   QUEtiapine  12.5 mg Oral q morning   QUEtiapine  25 mg Oral QHS   senna-docusate  2 tablet Oral BID   sevelamer carbonate  2,400 mg Oral TID WC   sodium zirconium cyclosilicate  10 g Oral TID   Continuous Infusions:  albumin human 25 g (10/19/23 1304)   vancomycin Stopped (10/19/23 1706)          Glade Lloyd, MD [facility] 10/21/2023, 6:59 AM

## 2023-10-21 NOTE — TOC Progression Note (Addendum)
Transition of Care [facility]) - Progression Note    Patient Details  Name: [name] MRN: [id number] Date of Birth: 04-18-1981  [facility]) CM/SW Contact  Erin Sons, [name] Phone Number: 10/21/2023, 10:50 AM  Clinical Narrative:     CSW contacted [facility] admissions to inquire about potential for pt to admit to them. Their admissions expresses concern regarding notes they saw about CSW seeking another SNF option. CSW explained that [facility] is pt and pt's mother's first preference though that since they are unable to provide a clear bed offer, CSW has begun to seek alternative placement.  [facility] admissions states they have "limited beds" and have multiple admissions ahead of pt. They suggested CSW pursue alternative option though have not given CSW an actual denial.  CSW informed TOC supervisor of situation.   Alternative SNF option would be [facility] in [place] which requires pt's OPHD to be changed. Renal navigator is seeking OPHD chair at alternative location that [facility] can accommodate; referral still pending.   Expected Discharge Plan: Skilled Nursing Facility Barriers to Discharge: Continued Medical Work up                                       Social Determinants of Health (SDOH) Interventions SDOH Screenings   Food Insecurity: No Food Insecurity (09/09/2023)  Housing: Unknown (09/09/2023)  Transportation Needs: No Transportation Needs (09/09/2023)  Utilities: Not At Risk (09/09/2023)  Alcohol Screen: Low Risk  (02/15/2023)  Depression (PHQ2-9): Low Risk  (02/15/2023)  Financial Resource Strain: Low Risk  (02/15/2023)  Physical Activity: Inactive (02/15/2023)  Social Connections: Socially Integrated (02/15/2023)  Stress: No Stress Concern Present (02/15/2023)  Tobacco Use: Low Risk  (09/12/2023)  Recent Concern: Tobacco Use - Medium Risk (09/01/2023)   Received from [facility]    Readmission Risk Interventions     No data to display

## 2023-10-21 NOTE — Progress Notes (Signed)
Contacted [name] with [facility] out-pt HD. Pt's referral for [facility] is currently pending. Update provided to CSW. Will await determination.   Olivia Canter Renal Navigator [phone]

## 2023-10-21 NOTE — Progress Notes (Signed)
[facility] Progress Note   Subjective:    Seen in room. Asking questions about discharge, SNF bed still pending.  No cp/sob. For dialysis today.    Objective Vitals:   10/20/23 2159 10/20/23 2357 10/21/23 0634 10/21/23 0942  BP: (!) 153/92 (!) 138/104 (!) 141/93 (!) 116/95  Pulse: (!) 111 (!) 112 (!) 114 (!) 108  Resp: 18 18 19 18   Temp: 98.4 F (36.9 C) 98.3 F (36.8 C) 98.4 F (36.9 C)   TempSrc:      SpO2: 96% 95% 95%   Weight:      Height:       Physical Exam General: Chronically ill appearing man, NAD. Room air Heart: RRR; 2/6 murmur Lungs: CTA anteriorly Abdomen: soft Extremities: 1-2+ BLE and LUE edema Dialysis Access: L femoral TDC  Additional Objective Labs: Basic Metabolic Panel: Recent Labs  Lab 10/14/23 1504 10/16/23 0411 10/16/23 2119 10/17/23 1121 10/19/23 1035 10/19/23 1740  NA 136 135  --  136 137 137  K 5.9* 6.2*   < > 4.9 5.2* 4.5  CL 94* 94*  --  96* 94* 95*  CO2 26 24  --  25 24 25   GLUCOSE 289* 281*  --  137* 75 143*  BUN 62* 52*  --  38* 56* 29*  CREATININE 7.73* 6.77*  --  5.72* 7.67* 4.33*  CALCIUM 9.1 9.3  --  9.7 9.7 9.3  PHOS 9.8* 8.3*  --   --  7.9*  --    < > = values in this interval not displayed.   CBC: Recent Labs  Lab 10/14/23 1504 10/16/23 0411 10/17/23 1121 10/19/23 1035  WBC 5.7 5.6 6.3 5.8  NEUTROABS  --   --  3.8  --   HGB 8.5* 8.5* 9.5* 10.1*  HCT 29.6* 28.6* 31.5* 33.6*  MCV 96.4 93.5 91.8 92.1  PLT 156 147* 148* 179   CBG: Recent Labs  Lab 10/20/23 2159 10/21/23 0726 10/21/23 0743 10/21/23 0815 10/21/23 1132  GLUCAP 126* 46* 41* 105* 147*   Studies/Results: No results found.  Medications:  albumin human 25 g (10/19/23 1304)   vancomycin (VANCOCIN) 750 mg in sodium chloride 0.9 % 250 mL IVPB      (feeding supplement) PROSource Plus  30 mL Oral BID BM   apixaban  5 mg Oral BID   bacitracin   Topical Daily   Chlorhexidine Gluconate Cloth  6 each Topical Q0600   darbepoetin  (ARANESP) injection - DIALYSIS  100 mcg Subcutaneous Q Sun-1800   dextrose  1 Tube Oral Once   gabapentin  100 mg Oral TID   Gerhardt's butt cream   Topical BID   influenza vac split trivalent PF  0.5 mL Intramuscular Tomorrow-1000   insulin aspart  0-5 Units Subcutaneous QHS   insulin aspart  0-6 Units Subcutaneous TID WC   loratadine  10 mg Oral Daily   midodrine  10 mg Oral Q M,W,F-HD   pantoprazole  40 mg Oral Daily   povidone-Iodine   Topical Q0600   QUEtiapine  12.5 mg Oral q morning   QUEtiapine  25 mg Oral QHS   senna-docusate  2 tablet Oral BID   sevelamer carbonate  2,400 mg Oral TID WC   sodium zirconium cyclosilicate  10 g Oral TID    Dialysis Orders MWF - [facility] -> will be going to [place] [facility]TS on discharge 4hr, 400/A1.5, EDW 77kg, L femoral TDC, 2K/3Ca bath, no heparin   Assessment/Plan: 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 Continues on extended Vanc course - will be on Vanc IV q HD until 10/27/23. MRSA brain abscess: S/p transfer to [facility] and drainage 08/31/24. Repeat brain MRI 10/17/23 showed improvement. Acute B CVA/septic emboli Acute LUE DVT: On Eliquis ESRD: Transitioned to TTS, next HD 1/23.  HTN/volume: BP stable, CT with diffuse anasarca. Significantly above EDW. Max UF with HD. 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 Last 1/19.  No IV iron while on abx. Secondary HPTH: CorrCa/Phos high. Continue to hold calcitriol. Sevelamer increased to 3/meals. Hyperkalemia: Persistently high despite Lokelma 10g TID, using 1K bath occ with HD. Nutrition: Alb low, continue supplements.  T1DM: per primary Dispo: SNF being arranged - not finalized yet    Shane Blase PA-C [facility] 10/21/2023,12:31 PM

## 2023-10-21 NOTE — Progress Notes (Addendum)
Pt cut off 1 hour refused to complete tx. AMA form signed . Len Childs PA notified.Albumin 50 g IV given during HD.   10/21/23 1736  Vitals  Temp 98.4 F (36.9 C)  Temp Source Oral  BP 130/84  BP Location Right Arm  BP Method Automatic  Patient Position (if appropriate) Lying  Pulse Rate (!) 117  Resp 20  Oxygen Therapy  SpO2 100 %  O2 Device Room Air  During Treatment Monitoring  Intra-Hemodialysis Comments See progress note  Post Treatment  Dialyzer Clearance Lightly streaked  Hemodialysis Intake (mL) 0 mL  Liters Processed 60  Fluid Removed (mL) 1800 mL  Tolerated HD Treatment Yes  Post-Hemodialysis Comments see notes  Hemodialysis Catheter Left Femoral vein Double lumen Permanent (Tunneled)  Placement Date/Time: 07/16/23 1318   Serial / Lot #: [id number]  Expiration Date: 05/24/26  Time Out: Correct patient;Correct site;Correct procedure  Maximum sterile barrier precautions: Hand hygiene;Cap;Mask;Sterile gown;Sterile gloves;Large sterile ...  Site Condition No complications  Blue Lumen Status Heparin locked  Red Lumen Status Heparin locked  Catheter fill solution Heparin 1000 units/ml  Catheter fill volume (Arterial) 2.1 cc  Catheter fill volume (Venous) 2.1  Dressing Type Gauze/Drain sponge;Transparent  Dressing Status Antimicrobial disc/dressing in place;Clean, Dry, Intact  Interventions New dressing  Drainage Description None  Dressing Change Due 10/23/23  Post treatment catheter status Capped and Clamped

## 2023-10-21 NOTE — Plan of Care (Signed)
  Problem: Health Behavior/Discharge Planning: Goal: Ability to manage health-related needs will improve Outcome: Progressing   Problem: Clinical Measurements: Goal: Ability to maintain clinical measurements within normal limits will improve Outcome: Progressing Goal: Will remain free from infection Outcome: Progressing Goal: Diagnostic test results will improve Outcome: Progressing Goal: Cardiovascular complication will be avoided Outcome: Progressing   Problem: Activity: Goal: Risk for activity intolerance will decrease Outcome: Progressing   Problem: Coping: Goal: Level of anxiety will decrease Outcome: Progressing   Problem: Skin Integrity: Goal: Risk for impaired skin integrity will decrease Outcome: Progressing   Problem: Education: Goal: Ability to describe self-care measures that may prevent or decrease complications (Diabetes Survival Skills Education) will improve Outcome: Progressing Goal: Individualized Educational Video(s) Outcome: Progressing   Problem: Coping: Goal: Ability to adjust to condition or change in health will improve Outcome: Progressing   Problem: Fluid Volume: Goal: Ability to maintain a balanced intake and output will improve Outcome: Progressing   Problem: Health Behavior/Discharge Planning: Goal: Ability to identify and utilize available resources and services will improve Outcome: Progressing Goal: Ability to manage health-related needs will improve Outcome: Progressing   Problem: Metabolic: Goal: Ability to maintain appropriate glucose levels will improve Outcome: Progressing   Problem: Skin Integrity: Goal: Risk for impaired skin integrity will decrease Outcome: Progressing   Problem: Tissue Perfusion: Goal: Adequacy of tissue perfusion will improve Outcome: Progressing   Problem: Education: Goal: Knowledge of disease and its progression will improve Outcome: Progressing Goal: Individualized Educational Video(s) Outcome:  Progressing   Problem: Fluid Volume: Goal: Compliance with measures to maintain balanced fluid volume will improve Outcome: Progressing   Problem: Health Behavior/Discharge Planning: Goal: Ability to manage health-related needs will improve Outcome: Progressing   Problem: Nutritional: Goal: Ability to make healthy dietary choices will improve Outcome: Progressing   Problem: Clinical Measurements: Goal: Complications related to the disease process, condition or treatment will be avoided or minimized Outcome: Progressing

## 2023-10-21 NOTE — Progress Notes (Signed)
Physical Therapy Treatment Patient Details Name: [name] MRN: [id number] DOB: [birth year] Today's Date: 10/21/2023   History of Present Illness 43 y.o. male presents to [facility] 09/09/23 as a transfer from [facility] for further management of endocarditis. Pt originally was admitted to [facility] 07/09/23 for DKA and sepsis after being found on the ground. 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 10/17 pt had a R MCA, L occipital, and B cerebellum CVA w/ septic emboli. ICU admit 10/18 w/ intubation 10/18-10/24. Pt was transferred to [facility] for MV repair on 11/29. At [facility], pt had cardiac cath w/ non obstructive disease, SAH, a craniotomy w/ abscess drainage on 12/4, and R third toe distal necrosis. PMH: chronic L AKA, MRSA, IDDM, ESRD on HD MWF, HTN, TEE positive for vegetation on multiple valves, cellulitis, medical noncompliance, narcotic dependence, DM with gastroparesis, L internal jugular DVT.    PT Comments  The pt was agreeable to session, limited by pain in his bottom at this time. He requires sequential cues, increased time, and use of bed features to complete bed mobility and transition to sitting EOB. The pt was able to maintain sitting EOB for ~ 10 min working on seated balance and RLE ROM and strength. He reports limited activity tolerance due to pain in his bottom, too fatigued and in pain to attempt OOB mobility this session. Continue to recommend inpatient therapies <3hours/day to maximize functional recovery and independence after d/c.     If plan is discharge home, recommend the following: Two people to help with walking and/or transfers;Assist for transportation;Supervision due to cognitive status;Help with stairs or ramp for entrance;Two people to help with bathing/dressing/bathroom;Assistance with cooking/housework;Direct supervision/assist for financial management;Direct supervision/assist for medications management   Can travel by private vehicle      No  Equipment Recommendations  Hoyer lift;Wheelchair (measurements PT);Wheelchair cushion (measurements PT);Other (comment)    Recommendations for Other Services       Precautions / Restrictions Precautions Precautions: Fall;Other (comment) Precaution Comments: Contact precs; L AKA, L hemiparesis and edema, L neglect, bowel incontinence. L femoral HD cath Restrictions Weight Bearing Restrictions Per Provider Order: No LLE Weight Bearing Per Provider Order: Non weight bearing Other Position/Activity Restrictions: Pt has sling for LUE when mobilizing OOB; LLE prosthetic not fitting     Mobility  Bed Mobility Overal bed mobility: Needs Assistance Bed Mobility: Rolling, Supine to Sit, Sit to Supine Rolling: Max assist, Used rails, Min assist Sidelying to sit: Max assist, +2 for safety/equipment, HOB elevated, Used rails   Sit to supine: Max assist, +2 for physical assistance, +2 for safety/equipment   General bed mobility comments: maxA to roll to R side, pt unable to use LUE to assist, reports R arm not strong enough to pull to sitting. Pt then able to complete some pulling with RUE on mobility specialist with assist at pad to scoot to EOB.    Transfers                   General transfer comment: pt declined     Balance Overall balance assessment: Needs assistance Sitting-balance support: Single extremity supported Sitting balance-[name]: Poor Sitting balance - Comments: initially minA, progressed to CGA Postural control: Posterior lean, Right lateral lean                                  Cognition Arousal: Alert Behavior During Therapy: Flat  affect Overall Cognitive Status: Impaired/Different from baseline Area of Impairment: Attention, Memory, Following commands, Safety/judgement, Awareness, Problem solving                   Current Attention Level: Selective Memory: Decreased short-term memory Following Commands: Follows one  step commands consistently, Follows multi-step commands inconsistently, Follows one step commands with increased time   Awareness: Emergent Problem Solving: Difficulty sequencing, Requires verbal cues, Slow processing, Decreased initiation General Comments: pt agreeable to session, reports he is motivated to make slow improvements. Pt able to follow cues intermittently, at times not following cues and then when they are repeated he acknowledges instruction and states he doesnt want to        Exercises General Exercises - Lower Extremity Long Arc Quad: AAROM, Right, 5 reps (against minA)    General Comments General comments (skin integrity, edema, etc.): VSS on RA      Pertinent Vitals/Pain Pain Assessment Pain Assessment: Faces Faces Pain Scale: Hurts little more Pain Location: buttocks, wound Pain Descriptors / Indicators: Sore, Discomfort Pain Intervention(s): Limited activity within patient's tolerance, Monitored during session, Repositioned     PT Goals (current goals can now be found in the care plan section) Acute Rehab PT Goals Patient Stated Goal: rehab at SNF PT Goal Formulation: With patient Time For Goal Achievement: 10/26/23 Potential to Achieve Goals: Fair Progress towards PT goals: Progressing toward goals    Frequency    Min 1X/week       AM-PAC PT "6 Clicks" Mobility   Outcome Measure  Help needed turning from your back to your side while in a flat bed without using bedrails?: A Lot Help needed moving from lying on your back to sitting on the side of a flat bed without using bedrails?: Total Help needed moving to and from a bed to a chair (including a wheelchair)?: Total Help needed standing up from a chair using your arms (e.g., wheelchair or bedside chair)?: Total Help needed to walk in hospital room?: Total Help needed climbing 3-5 steps with a railing? : Total 6 Click Score: 7    End of Session   Activity Tolerance: Patient tolerated  treatment well;Other (comment);Patient limited by pain (pt limiting treatment due pain in bottom) Patient left: in bed;with call bell/phone within reach;with bed alarm set Nurse Communication: Mobility status;Need for lift equipment PT Visit Diagnosis: Other abnormalities of gait and mobility (R26.89);Other symptoms and signs involving the nervous system (R29.898);Hemiplegia and hemiparesis Hemiplegia - Right/Left: Left Hemiplegia - dominant/non-dominant: Non-dominant Hemiplegia - caused by: Cerebral infarction     Time: 1212-1238 PT Time Calculation (min) (ACUTE ONLY): 26 min  Charges:    $Therapeutic Exercise: 8-22 mins $Therapeutic Activity: 8-22 mins PT General Charges $$ ACUTE PT VISIT: 1 Visit                     Vickki Muff, PT, DPT   Acute Rehabilitation Department Office [phone] Secure Chat Communication Preferred   Ronnie Derby 10/21/2023, 1:26 PM

## 2023-10-22 DIAGNOSIS — R7881 Bacteremia: Secondary | ICD-10-CM | POA: Diagnosis not present

## 2023-10-22 DIAGNOSIS — I38 Endocarditis, valve unspecified: Secondary | ICD-10-CM | POA: Diagnosis not present

## 2023-10-22 DIAGNOSIS — B9562 Methicillin resistant Staphylococcus aureus infection as the cause of diseases classified elsewhere: Secondary | ICD-10-CM | POA: Diagnosis not present

## 2023-10-22 DIAGNOSIS — D631 Anemia in chronic kidney disease: Secondary | ICD-10-CM | POA: Diagnosis not present

## 2023-10-22 DIAGNOSIS — I120 Hypertensive chronic kidney disease with stage 5 chronic kidney disease or end stage renal disease: Secondary | ICD-10-CM | POA: Diagnosis not present

## 2023-10-22 DIAGNOSIS — N250 Renal osteodystrophy: Secondary | ICD-10-CM | POA: Diagnosis not present

## 2023-10-22 DIAGNOSIS — I330 Acute and subacute infective endocarditis: Secondary | ICD-10-CM | POA: Diagnosis not present

## 2023-10-22 DIAGNOSIS — Z992 Dependence on renal dialysis: Secondary | ICD-10-CM | POA: Diagnosis not present

## 2023-10-22 DIAGNOSIS — N186 End stage renal disease: Secondary | ICD-10-CM | POA: Diagnosis not present

## 2023-10-22 LAB — GLUCOSE, CAPILLARY
Glucose-Capillary: 196 mg/dL — ABNORMAL HIGH (ref 70–99)
Glucose-Capillary: 273 mg/dL — ABNORMAL HIGH (ref 70–99)
Glucose-Capillary: 257 mg/dL — ABNORMAL HIGH (ref 70–99)
Glucose-Capillary: 286 mg/dL — ABNORMAL HIGH (ref 70–99)

## 2023-10-22 MED ORDER — INSULIN GLARGINE-YFGN 100 UNIT/ML ~~LOC~~ SOLN
8.0000 [IU] | Freq: Every day | SUBCUTANEOUS | Status: DC
  Administered 2023-10-22 – 2023-10-24 (×3): 8 [IU] via SUBCUTANEOUS
  Filled 2023-10-22 (×3): qty 0.08

## 2023-10-22 MED ORDER — CHLORHEXIDINE GLUCONATE CLOTH 2 % EX PADS: 6.0000 | MEDICATED_PAD | Freq: Every day | CUTANEOUS | Status: DC

## 2023-10-22 NOTE — Progress Notes (Signed)
[facility] Progress Note   Subjective:   Pt signed off HD early yesterday. Eager to discharge but otherwise no concerns. Denies SOB, CP, dizziness, nausea  Objective Vitals:   10/21/23 1736 10/21/23 1744 10/21/23 2120 10/22/23 0554  BP: 130/84  (!) 142/58 110/70  Pulse: (!) 117  (!) 125 (!) 114  Resp: 20  18 18   Temp: 98.4 F (36.9 C)  98.3 F (36.8 C) 98.6 F (37 C)  TempSrc: Oral     SpO2: 100%  92% 93%  Weight:  95 kg    Height:       Physical Exam General: Alert male in NAD Heart: slightly tachycardic, regular rhythm, 2/6 systolic murmur Lungs: CTA bilaterally, respirations unlabored on RA Abdomen: Soft, non-distended, +BS Extremities: 1+ edema b/l upper and lower extremities Dialysis Access:  L femoral [facility]  Additional Objective Labs: Basic Metabolic Panel: Recent Labs  Lab 10/16/23 0411 10/16/23 2119 10/17/23 1121 10/19/23 1035 10/19/23 1740  NA 135  --  136 137 137  K 6.2*   < > 4.9 5.2* 4.5  CL 94*  --  96* 94* 95*  CO2 24  --  25 24 25   GLUCOSE 281*  --  137* 75 143*  BUN 52*  --  38* 56* 29*  CREATININE 6.77*  --  5.72* 7.67* 4.33*  CALCIUM 9.3  --  9.7 9.7 9.3  PHOS 8.3*  --   --  7.9*  --    < > = values in this interval not displayed.   Liver Function Tests: Recent Labs  Lab 10/19/23 1035 10/19/23 1740  AST  --  19  20  ALT  --  8  10  ALKPHOS  --  150*  150*  BILITOT  --  1.3*  1.3*  PROT  --  7.8  7.7  ALBUMIN 3.6 3.8  3.9   No results for input(s): "LIPASE", "AMYLASE" in the last 168 hours. CBC: Recent Labs  Lab 10/16/23 0411 10/17/23 1121 10/19/23 1035  WBC 5.6 6.3 5.8  NEUTROABS  --  3.8  --   HGB 8.5* 9.5* 10.1*  HCT 28.6* 31.5* 33.6*  MCV 93.5 91.8 92.1  PLT 147* 148* 179   Blood Culture    Component Value Date/Time   SDES BLOOD SITE NOT SPECIFIED 08/25/2023 1706   SPECREQUEST  08/25/2023 1706    BOTTLES DRAWN AEROBIC ONLY Blood Culture results may not be optimal due to an inadequate volume of  blood received in culture bottles   CULT  08/25/2023 1706    NO GROWTH 5 DAYS Performed at [facility], [address], [place], [name] [address]    REPTSTATUS 08/30/2023 FINAL 08/25/2023 1706    Cardiac Enzymes: No results for input(s): "CKTOTAL", "CKMB", "CKMBINDEX", "TROPONINI" in the last 168 hours. CBG: Recent Labs  Lab 10/21/23 0743 10/21/23 0815 10/21/23 1132 10/21/23 2122 10/22/23 0744  GLUCAP 41* 105* 147* 235* 196*   Iron Studies: No results for input(s): "IRON", "TIBC", "TRANSFERRIN", "FERRITIN" in the last 72 hours. @lablastinr3 @ Studies/Results: No results found. Medications:  albumin human 25 g (10/21/23 1749)   vancomycin (VANCOCIN) 750 mg in sodium chloride 0.9 % 250 mL IVPB 750 mg (10/21/23 2301)    (feeding supplement) PROSource Plus  30 mL Oral BID BM   apixaban  5 mg Oral BID   bacitracin   Topical Daily   Chlorhexidine Gluconate Cloth  6 each Topical Q0600   darbepoetin (ARANESP) injection - DIALYSIS  100  mcg Subcutaneous Q Sun-1800   dextrose  1 Tube Oral Once   gabapentin  100 mg Oral TID   Gerhardt's butt cream   Topical BID   influenza vac split trivalent PF  0.5 mL Intramuscular Tomorrow-1000   insulin aspart  0-5 Units Subcutaneous QHS   insulin aspart  0-6 Units Subcutaneous TID WC   insulin glargine-yfgn  8 Units Subcutaneous Daily   loratadine  10 mg Oral Daily   pantoprazole  40 mg Oral Daily   povidone-Iodine   Topical Q0600   QUEtiapine  12.5 mg Oral q morning   QUEtiapine  25 mg Oral QHS   senna-docusate  2 tablet Oral BID   sevelamer carbonate  2,400 mg Oral TID WC   sodium zirconium cyclosilicate  10 g Oral TID    Dialysis Orders:  MWF - [facility] -> will be going to [place] [facility] on discharge 4hr, 400/A1.5, EDW 77kg, L femoral TDC, 2K/3Ca bath, no heparin  Assessment/Plan:  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 Continues on extended Vanc course - will be on Vanc IV q HD until 10/27/23. MRSA brain abscess: S/p transfer to [facility] and drainage 08/31/24. Repeat brain MRI 10/17/23 showed improvement. Acute B CVA/septic emboli- due to above Acute LUE DVT: On Eliquis ESRD: Transitioned to TTS, next HD 1/25 HTN/volume: BP stable, CT with diffuse anasarca. Significantly above EDW. Max UF with HD. Continue to try to enforce fluid restrictions/compliance with full HD time Anemia of ESRD: Hgb 10.1 Aranesp to 150 mcg q Sunday while here. Last 1/19.  No IV iron while on abx. Secondary HPTH: CorrCa/Phos high. Continue to hold calcitriol. Sevelamer increased to 3/meals. Hyperkalemia: Persistently high despite Lokelma 10g TID, using 1K bath occ with HD. Nutrition: Alb low, continue supplements.  T1DM: per primary Dispo: SNF being arranged - not finalized yet    Rogers Blocker, PA-C 10/22/2023, 8:55 AM  [facility] Pager: [phone]

## 2023-10-22 NOTE — Progress Notes (Signed)
Occupational Therapy Treatment Patient Details Name: [name] MRN: [id number] DOB: 07/15/1981 Today's Date: 10/22/2023   History of present illness 43 y.o. male presents to [facility] 09/09/23 as a transfer from [facility] for further management of endocarditis. Pt originally was admitted to [facility] 07/09/23 for DKA and sepsis after being found on the ground. 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 10/17 pt had a R MCA, L occipital, and B cerebellum CVA w/ septic emboli. ICU admit 10/18 w/ intubation 10/18-10/24. Pt was transferred to [facility] for MV repair on 11/29. At [facility], pt had cardiac cath w/ non obstructive disease, SAH, a craniotomy w/ abscess drainage on 12/4, and R third toe distal necrosis. PMH: chronic L AKA, MRSA, IDDM, ESRD on HD MWF, HTN, TEE positive for vegetation on multiple valves, cellulitis, medical noncompliance, narcotic dependence, DM with gastroparesis, L internal jugular DVT.   OT comments  OT session focused on training in techniques for increased safety and independence with ADLs. Session performed from bed level due to pt with self-limiting behaviors, declining sitting EOB or attempting OOB session, and with pt limited by current cognitive status with poor insight into deficits and poor understanding of role of acute rehab services. Pt currently demonstrates ability to complete UB ADLs with Set up to Mod assist and LB ADLs with Min to Max assist. VSS on RA throughout session. Pt is making slow progress toward OT goals. OT goals updated this day based on pt progress and current functional level. Pt will benefit from continued acute OT services to address deficits and increase safety and independence with functional tasks. Post acute discharge, pt will benefit in intensive inpatient skilled rehab services < 3 hours per day to maximize rehab potential.       If plan is discharge home, recommend the following:  Two people to help with walking  and/or transfers;A lot of help with bathing/dressing/bathroom;Assistance with cooking/housework;Assistance with feeding;Help with stairs or ramp for entrance;Assist for transportation;Direct supervision/assist for financial management;Direct supervision/assist for medications management   Equipment Recommendations  Other (comment) (defer to next level of care)    Recommendations for Other Services      Precautions / Restrictions Precautions Precautions: Fall;Other (comment) Precaution Comments: Contact precs; L AKA, L hemiparesis and edema, L neglect, bowel incontinence. L femoral HD cath Restrictions Weight Bearing Restrictions Per Provider Order: No Other Position/Activity Restrictions: Pt has sling for LUE when mobilizing OOB; LLE prosthetic not fitting       Mobility Bed Mobility Overal bed mobility: Needs Assistance Bed Mobility: Rolling Rolling: Min assist, Max assist, Used rails         General bed mobility comments: Min assist to roll to left side; Max assist to roll to Right with no funcitonal use of L UE; pt declined sitting EOB this session    Transfers Overall transfer level: Needs assistance                 General transfer comment: pt declined this session     Balance                                           ADL either performed or assessed with clinical judgement   ADL Overall ADL's : Needs assistance/impaired Eating/Feeding: Set up;Bed level   Grooming: Set up;Bed level   Upper Body Bathing: Moderate assistance;Bed level;Cueing for compensatory  techniques;Cueing for sequencing Upper Body Bathing Details (indicate cue type and reason): simulated Lower Body Bathing: Maximal assistance;Bed level;Cueing for sequencing;Cueing for compensatory techniques Lower Body Bathing Details (indicate cue type and reason): simulated Upper Body Dressing : Minimal assistance;Bed level;Cueing for sequencing;Cueing for compensatory techniques    Lower Body Dressing: Minimal assistance;Maximal assistance;Cueing for sequencing;Cueing for compensatory techniques;Bed level Lower Body Dressing Details (indicate cue type and reason): Pt able to donn/doff R sock from bed level with Min assist and requires Max assist form bed level for donning/doffing other LB clothing   Toilet Transfer Details (indicate cue type and reason): pt declined sitting EOB or attempting OOB transfer this session Toileting- Clothing Manipulation and Hygiene: Maximal assistance;Bed level;Cueing for sequencing;Cueing for compensatory techniques Toileting - Clothing Manipulation Details (indicate cue type and reason): simulated       General ADL Comments: Pt with noted improvements in ability to attend to preferred tasks but continuing to require redirection and encouragement to attend to non-preferred tasks. Pt with self-limiting behaviors this session, declining attempt to sit EOB or attempt OOB transfer.    Extremity/Trunk Assessment Upper Extremity Assessment Upper Extremity Assessment: Right hand dominant;LUE deficits/detail (R UE strength, ROM, and coordination WFL) LUE Deficits / Details: No active movement noted, no activation of scapula. no subluxation noted. Edematous throughout UE. pt with decreased hand extension of digits. Would likely benefit from a resting hand splint. LUE Sensation: decreased light touch;decreased proprioception LUE Coordination: decreased fine motor;decreased gross motor   Lower Extremity Assessment Lower Extremity Assessment: Defer to PT evaluation        Vision       Perception     Praxis      Cognition Arousal: Alert Behavior During Therapy: WFL for tasks assessed/performed Overall Cognitive Status: Impaired/Different from baseline (No family/caregiver present to confirm baseline) Area of Impairment: Attention, Memory, Following commands, Safety/judgement, Awareness, Problem solving                 Orientation  Level: Disoriented to, Time Current Attention Level: Selective Memory: Decreased short-term memory Following Commands: Follows one step commands consistently, Follows multi-step commands inconsistently, Follows one step commands with increased time Safety/Judgement: Decreased awareness of safety, Decreased awareness of deficits Awareness: Emergent Problem Solving: Difficulty sequencing, Requires verbal cues, Slow processing, Decreased initiation General Comments: Pleasant throughout session but with self-limiting behaviors and poor insight into deficits and into importance of active participation in skilled rehab services. Pt expresses desire to get stronger and be independence and also declines sitting EOB due to Friday being pt's "day to chill." OT re-educated pt in role and importance of OT with pt verbalizing understand but conituing to decline sitting EOB this session.        Exercises      Shoulder Instructions       General Comments VSS on RA throughout session. RN present during a portion of session.    Pertinent Vitals/ Pain       Pain Assessment Pain Assessment: Faces Faces Pain Scale: Hurts little more Pain Location: buttocks, wound Pain Descriptors / Indicators: Sore, Discomfort Pain Intervention(s): Limited activity within patient's tolerance, Monitored during session, Premedicated before session, Repositioned  Home Living                                          Prior Functioning/Environment  Frequency  Min 1X/week        Progress Toward Goals  OT Goals(current goals can now be found in the care plan section)  Progress towards OT goals: Goals updated  ADL Goals Pt Will Perform Grooming: with set-up;sitting (sitting EOB with Fair balance for 5 or more minutes) Additional ADL Goal #1: Patient will complete bed mobility during/in preparation for functional tasks with Mod assist. Additional ADL Goal #2: Patient will  demonstrate ability to attend to a non-preferred functional or therapeutic task for 5 or more minutes with no cues needed to maintain attention to task to increase safety and independence with functional tasks. Additional ADL Goal #3: Patient will complete functional transfers with sliding board with Mod assist +2 to increase safety and independence.  Plan      Co-evaluation                 AM-PAC OT "6 Clicks" Daily Activity     Outcome Measure   Help from another person eating meals?: A Little Help from another person taking care of personal grooming?: A Little Help from another person toileting, which includes using toliet, bedpan, or urinal?: A Lot Help from another person bathing (including washing, rinsing, drying)?: A Lot Help from another person to put on and taking off regular upper body clothing?: A Little Help from another person to put on and taking off regular lower body clothing?: A Lot 6 Click Score: 15    End of Session    OT Visit Diagnosis: Other abnormalities of gait and mobility (R26.89);Other symptoms and signs involving the nervous system (R29.898);Hemiplegia and hemiparesis;Muscle weakness (generalized) (M62.81);Other symptoms and signs involving cognitive function Hemiplegia - Right/Left: Left Hemiplegia - dominant/non-dominant: Non-Dominant Hemiplegia - caused by: Cerebral infarction   Activity Tolerance Other (comment);Patient tolerated treatment well (Pt with self-limiting behaviors. Pt limited by current cognitive status/poor insight into deficits and role of rehab services.)   Patient Left in bed;with call bell/phone within reach;with bed alarm set;with nursing/sitter in room   Nurse Communication Mobility status;Other (comment) (pt with self-limiting behaviors this session; pt reporting pain in buttocks)        Time: 1610-9604 OT Time Calculation (min): 19 min  Charges: OT General Charges $OT Visit: 1 Visit OT Treatments $Self Care/Home  Management : 8-22 mins  Ravon Mcilhenny "Orson Eva., OTR/L, MA Acute Rehab [phone]   Lendon Colonel 10/22/2023, 7:40 PM

## 2023-10-22 NOTE — Plan of Care (Signed)
  Problem: Clinical Measurements: Goal: Ability to maintain clinical measurements within normal limits will improve Outcome: Progressing Goal: Will remain free from infection Outcome: Progressing   Problem: Activity: Goal: Risk for activity intolerance will decrease Outcome: Progressing   Problem: Skin Integrity: Goal: Risk for impaired skin integrity will decrease Outcome: Progressing   Problem: Metabolic: Goal: Ability to maintain appropriate glucose levels will improve Outcome: Progressing   Problem: Skin Integrity: Goal: Risk for impaired skin integrity will decrease Outcome: Progressing

## 2023-10-22 NOTE — Progress Notes (Addendum)
Contacted Kim with [facility] out-pt HD. Pt's referral to [facility] is currently pending. Awaiting determination from clinic. Will assist as needed.   Olivia Canter Renal Navigator [phone]  Addendum at 4:36 pm: Received a return call from Kim with [facility] who states that [facility] in not going to be able to accept pt. Selena Batten states she may be able to reach out to another clinic on Monday to see if they would be an option for pt. Will f/u with Selena Batten on Monday. Update provided to CSW regarding the above info.

## 2023-10-22 NOTE — TOC Progression Note (Addendum)
Transition of Care [facility]) - Progression Note    Patient Details  Name: [name] MRN: [id number] Date of Birth: 1980-12-01  Transition of Care [facility]) CM/SW Contact  Erin Sons, [name] Phone Number: 10/22/2023, 11:58 AM  Clinical Narrative:     CSW called pt's mother and provided update that [facility] does not currently have any beds. Explained that [facility] in [place] is only other option currently. Explained that OPHD would need to be changed and that insurance auth would need to be submitted. Currently OPHD referral is pending.   Expected Discharge Plan: Skilled Nursing Facility Barriers to Discharge: Continued Medical Work up       Social Determinants of Health (SDOH) Interventions SDOH Screenings   Food Insecurity: No Food Insecurity (09/09/2023)  Housing: Unknown (09/09/2023)  Transportation Needs: No Transportation Needs (09/09/2023)  Utilities: Not At Risk (09/09/2023)  Alcohol Screen: Low Risk  (02/15/2023)  Depression (PHQ2-9): Low Risk  (02/15/2023)  Financial Resource Strain: Low Risk  (02/15/2023)  Physical Activity: Inactive (02/15/2023)  Social Connections: Socially Integrated (02/15/2023)  Stress: No Stress Concern Present (02/15/2023)  Tobacco Use: Low Risk  (09/12/2023)  Recent Concern: Tobacco Use - Medium Risk (09/01/2023)   Received from [facility]    Readmission Risk Interventions     No data to display

## 2023-10-22 NOTE — Progress Notes (Signed)
PROGRESS NOTE    [name]  NFA:[id number] DOB: [birth year] DOA: 09/09/2023 PCP: [name], NP   Brief Narrative:  43 y.o. male with medical history significant of ESRD on hemodialysis (not fully compliant), insulin-dependent type 1 diabetes, gastroparesis, Left AKA, admitted to [facility] on 07/09/23 for DKA, Sepsis and hospital course significant for MV AND TV  MRSA endocarditis, followed by acute MCA stroke , left occipital and bilateral cerebellar strokes consistent with septic emboli, brain abscess from MRSA, left IJ DVT was transferred to [facility] for MV repair on 11/29.  While at [facility] he underwent cardiac cath showing non obstructive disease, craniotomy with abscess drainage on 09/01/23,. He had subarachnoid hemorrhage while on IV heparin, right third toe distal necrosis,was seen by podiatry, but family wanted conservative management. Since his tertiary care needs have been completed, he was transferred back to [facility] for further management.  PTOT  recommending SNF and awaiting placement.   Assessment & Plan:    MRSA TV/MV endocarditis: 10/12, blood cultures positive for MRSA. 10/13, TTE showed LVEF of 25-30% with concern for AV/MV abnormality  10/16 and 10/22, TEE showed MV/TV vegetations.  Currently on IV vancomycin.  Per ID recommendation: complete 8 weeks of treatment EOT 1/29. Recommend outpatient follow up with ID.    Hypotension: Resolved.  Blood pressure intermittently on the higher side. DC'd midodrine on 10/21/2023.     ESRD on HD:  Nephrology on board.  Continue hemodialysis as per nephrology.   Acute CVA /acute metabolic encephalopathy: Mri brain on 10/17 showed large right MCA infarct.  11/25, MRA head showed significant for large necrotic appearing lesion within the right frontal lobe within area of the prior right MCA territory infarct with vasogenic edema and 8 mm leftward midline shift.  Neurology recommendations for Eliquis.  PT/OT recommendations for  SNF. Repeat MRI brain done for increased confusion. Showed decreased size of area of abnormal diffusion restriction within the posterior right frontal operculum compatible with reported abscess. The amount of surrounding edema has also decreased -Neurology has already signed off: Outpatient follow-up with neurology.   Cerebral emboli with abscess/ Cerebritis -Secondary to to MRSA Endocarditis.  -S/p craniotomy and abscess drainage.    Type 1 DM uncontrolled with hyperglycemia and hypoglycemia: -Continue long-acting insulin.  Continue CBGs with SSI.  Carb modified diet.   Acute left upper extremity DVT: -Continue Eliquis.  -Left upper extremity venous duplex showed age-indeterminate DVT involving the left axillary vein.   Anemia of chronic kidney disease: -Hemoglobin currently stable.  Monitor intermittently   Right third toe necrosis: H/o prior left BKA: Physical deconditioning Per documentation, patient/family opted for conservative management and declined surgical management. Patient was seen by PT/OT with recommendation for SNF on discharge.   Prostate fluid collection: Urology evaluated patient at [facility], suggested that its a dilated seminal vesicle and recommended for no surgical intervention.    Mild cognitive dysfunction with behavioral changes.  Currently stable on seroquel.   Obesity -Outpatient follow-up   DVT prophylaxis: Eliquis Code Status: Full Family Communication: None at bedside Disposition Plan: Status is: Inpatient Remains inpatient appropriate because: Of severity of illness.  Need for SNF placement.  Currently medically stable for discharge    Consultants: Nephrology/psychiatry/ID  Procedures: As above  Antimicrobials:  Anti-infectives (From admission, onward)    Start     Dose/Rate Route Frequency Ordered Stop   10/21/23 1230  vancomycin (VANCOCIN) 750 mg in sodium chloride 0.9 % 250 mL IVPB  Status:  Discontinued  750 mg 265 mL/hr  over 60 Minutes Intravenous Every T-Th-Sa (Hemodialysis) 10/21/23 0804 10/21/23 0811   10/21/23 1200  vancomycin (VANCOCIN) 750 mg in sodium chloride 0.9 % 250 mL IVPB        750 mg 250 mL/hr over 60 Minutes Intravenous Every T-Th-Sa (Hemodialysis) 10/21/23 0811 10/28/23 1159   10/16/23 1345  vancomycin (VANCOCIN) 750 mg in sodium chloride 0.9 % 250 mL IVPB  Status:  Discontinued        750 mg 265 mL/hr over 60 Minutes Intravenous Every T-Th-Sa (Hemodialysis) 10/16/23 1257 10/21/23 0804   10/16/23 1200  vancomycin (VANCOREADY) IVPB 750 mg/150 mL  Status:  Discontinued        750 mg 150 mL/hr over 60 Minutes Intravenous Every T-Th-Sa (Hemodialysis) 10/14/23 1843 10/16/23 1257   10/14/23 1845  vancomycin (VANCOREADY) IVPB 500 mg/100 mL        500 mg 100 mL/hr over 60 Minutes Intravenous  Once 10/14/23 1843 10/15/23 0806   10/14/23 1200  vancomycin (VANCOREADY) IVPB 500 mg/100 mL  Status:  Discontinued        500 mg 100 mL/hr over 60 Minutes Intravenous Every T-Th-Sa (Hemodialysis) 10/13/23 1309 10/14/23 1843   10/13/23 1200  vancomycin (VANCOREADY) IVPB 500 mg/100 mL  Status:  Discontinued        500 mg 100 mL/hr over 60 Minutes Intravenous Every M-W-F (Hemodialysis) 10/11/23 1517 10/13/23 1309   10/12/23 1200  vancomycin (VANCOREADY) IVPB 500 mg/100 mL  Status:  Discontinued        500 mg 100 mL/hr over 60 Minutes Intravenous Every T-Th-Sa (Hemodialysis) 10/11/23 1514 10/11/23 1517   09/24/23 1200  vancomycin (VANCOREADY) IVPB 500 mg/100 mL  Status:  Discontinued        500 mg 100 mL/hr over 60 Minutes Intravenous Every M-W-F (Hemodialysis) 09/21/23 1246 10/11/23 1514   09/20/23 1200  vancomycin (VANCOCIN) 750 mg in sodium chloride 0.9 % 250 mL IVPB  Status:  Discontinued        750 mg 265 mL/hr over 60 Minutes Intravenous Every M-W-F (Hemodialysis) 09/20/23 0446 09/21/23 1246   09/20/23 1045  vancomycin (VANCOCIN) 750 mg in sodium chloride 0.9 % 250 mL IVPB        750 mg 265 mL/hr over  60 Minutes Intravenous  Once 09/20/23 0959 09/20/23 1226   09/20/23 0945  vancomycin (VANCOREADY) IVPB 750 mg/150 mL  Status:  Discontinued        750 mg 150 mL/hr over 60 Minutes Intravenous  Once 09/20/23 0855 09/20/23 0959   09/10/23 1200  vancomycin (VANCOREADY) IVPB 750 mg/150 mL  Status:  Discontinued        750 mg 150 mL/hr over 60 Minutes Intravenous Every M-W-F (Hemodialysis) 09/09/23 2030 09/20/23 0446   09/09/23 2200  linezolid (ZYVOX) tablet 600 mg  Status:  Discontinued        600 mg Oral Every 12 hours 09/09/23 2035 09/10/23 0946        Subjective: Patient seen and examined at bedside.  No seizures, fever, vomiting or agitation reported.   Objective: Vitals:   10/21/23 1736 10/21/23 1744 10/21/23 2120 10/22/23 0554  BP: 130/84  (!) 142/58 110/70  Pulse: (!) 117  (!) 125 (!) 114  Resp: 20  18 18   Temp: 98.4 F (36.9 C)  98.3 F (36.8 C) 98.6 F (37 C)  TempSrc: Oral     SpO2: 100%  92% 93%  Weight:  95 kg    Height:  Intake/Output Summary (Last 24 hours) at 10/22/2023 0849 Last data filed at 10/22/2023 0639 Gross per 24 hour  Intake 252.63 ml  Output 1800 ml  Net -1547.37 ml   Filed Weights   10/19/23 1229 10/19/23 1626 10/21/23 1744  Weight: 95.1 kg 96.3 kg 95 kg    Examination:  General: No acute distress.  Currently on room air.  Slow to respond.  Poor historian.  Chronically ill and deconditioned looking. respiratory: Bilateral decreased breath sounds at bases with scattered crackles  CVS: S1 and S2 are heard; tachycardic intermittently  abdominal: Soft, nontender, distended mildly; no organomegaly; bowel sounds are heard normally extremities: Left BKA present.  Trace right lower extremity edema present.    Data Reviewed: I have personally reviewed following labs and imaging studies  CBC: Recent Labs  Lab 10/16/23 0411 10/17/23 1121 10/19/23 1035  WBC 5.6 6.3 5.8  NEUTROABS  --  3.8  --   HGB 8.5* 9.5* 10.1*  HCT 28.6* 31.5* 33.6*   MCV 93.5 91.8 92.1  PLT 147* 148* 179   Basic Metabolic Panel: Recent Labs  Lab 10/16/23 0411 10/16/23 2119 10/17/23 1121 10/19/23 1035 10/19/23 1740  NA 135  --  136 137 137  K 6.2* 4.4 4.9 5.2* 4.5  CL 94*  --  96* 94* 95*  CO2 24  --  25 24 25   GLUCOSE 281*  --  137* 75 143*  BUN 52*  --  38* 56* 29*  CREATININE 6.77*  --  5.72* 7.67* 4.33*  CALCIUM 9.3  --  9.7 9.7 9.3  MG 2.3  --   --   --   --   PHOS 8.3*  --   --  7.9*  --    GFR: Estimated Creatinine Clearance: 23.9 mL/min (A) (by C-G formula based on SCr of 4.33 mg/dL (H)). Liver Function Tests: Recent Labs  Lab 10/19/23 1035 10/19/23 1740  AST  --  19  20  ALT  --  8  10  ALKPHOS  --  150*  150*  BILITOT  --  1.3*  1.3*  PROT  --  7.8  7.7  ALBUMIN 3.6 3.8  3.9   No results for input(s): "LIPASE", "AMYLASE" in the last 168 hours. No results for input(s): "AMMONIA" in the last 168 hours. Coagulation Profile: No results for input(s): "INR", "PROTIME" in the last 168 hours. Cardiac Enzymes: No results for input(s): "CKTOTAL", "CKMB", "CKMBINDEX", "TROPONINI" in the last 168 hours. BNP (last 3 results) No results for input(s): "PROBNP" in the last 8760 hours. HbA1C: No results for input(s): "HGBA1C" in the last 72 hours. CBG: Recent Labs  Lab 10/21/23 0743 10/21/23 0815 10/21/23 1132 10/21/23 2122 10/22/23 0744  GLUCAP 41* 105* 147* 235* 196*   Lipid Profile: No results for input(s): "CHOL", "HDL", "LDLCALC", "TRIG", "CHOLHDL", "LDLDIRECT" in the last 72 hours. Thyroid Function Tests: No results for input(s): "TSH", "T4TOTAL", "FREET4", "T3FREE", "THYROIDAB" in the last 72 hours. Anemia Panel: No results for input(s): "VITAMINB12", "FOLATE", "FERRITIN", "TIBC", "IRON", "RETICCTPCT" in the last 72 hours. Sepsis Labs: No results for input(s): "PROCALCITON", "LATICACIDVEN" in the last 168 hours.  No results found for this or any previous visit (from the past 240 hours).       Radiology  Studies: No results found.      Scheduled Meds:  (feeding supplement) PROSource Plus  30 mL Oral BID BM   apixaban  5 mg Oral BID   bacitracin   Topical Daily  Chlorhexidine Gluconate Cloth  6 each Topical Q0600   darbepoetin (ARANESP) injection - DIALYSIS  100 mcg Subcutaneous Q Sun-1800   dextrose  1 Tube Oral Once   gabapentin  100 mg Oral TID   Gerhardt's butt cream   Topical BID   influenza vac split trivalent PF  0.5 mL Intramuscular Tomorrow-1000   insulin aspart  0-5 Units Subcutaneous QHS   insulin aspart  0-6 Units Subcutaneous TID WC   insulin glargine-yfgn  6 Units Subcutaneous Daily   loratadine  10 mg Oral Daily   pantoprazole  40 mg Oral Daily   povidone-Iodine   Topical Q0600   QUEtiapine  12.5 mg Oral q morning   QUEtiapine  25 mg Oral QHS   senna-docusate  2 tablet Oral BID   sevelamer carbonate  2,400 mg Oral TID WC   sodium zirconium cyclosilicate  10 g Oral TID   Continuous Infusions:  albumin human 25 g (10/21/23 1749)   vancomycin (VANCOCIN) 750 mg in sodium chloride 0.9 % 250 mL IVPB 750 mg (10/21/23 2301)          Glade Lloyd, MD [facility] 10/22/2023, 8:49 AM

## 2023-10-23 DIAGNOSIS — I38 Endocarditis, valve unspecified: Secondary | ICD-10-CM | POA: Diagnosis not present

## 2023-10-23 DIAGNOSIS — N186 End stage renal disease: Secondary | ICD-10-CM | POA: Diagnosis not present

## 2023-10-23 DIAGNOSIS — I330 Acute and subacute infective endocarditis: Secondary | ICD-10-CM | POA: Diagnosis not present

## 2023-10-23 DIAGNOSIS — B9562 Methicillin resistant Staphylococcus aureus infection as the cause of diseases classified elsewhere: Secondary | ICD-10-CM | POA: Diagnosis not present

## 2023-10-23 DIAGNOSIS — N250 Renal osteodystrophy: Secondary | ICD-10-CM | POA: Diagnosis not present

## 2023-10-23 DIAGNOSIS — D631 Anemia in chronic kidney disease: Secondary | ICD-10-CM | POA: Diagnosis not present

## 2023-10-23 DIAGNOSIS — I120 Hypertensive chronic kidney disease with stage 5 chronic kidney disease or end stage renal disease: Secondary | ICD-10-CM | POA: Diagnosis not present

## 2023-10-23 DIAGNOSIS — R7881 Bacteremia: Secondary | ICD-10-CM | POA: Diagnosis not present

## 2023-10-23 DIAGNOSIS — Z992 Dependence on renal dialysis: Secondary | ICD-10-CM | POA: Diagnosis not present

## 2023-10-23 LAB — GLUCOSE, CAPILLARY
Glucose-Capillary: 146 mg/dL — ABNORMAL HIGH (ref 70–99)
Glucose-Capillary: 187 mg/dL — ABNORMAL HIGH (ref 70–99)
Glucose-Capillary: 111 mg/dL — ABNORMAL HIGH (ref 70–99)
Glucose-Capillary: 108 mg/dL — ABNORMAL HIGH (ref 70–99)

## 2023-10-23 MED ORDER — MIDODRINE HCL 5 MG PO TABS
10.0000 mg | ORAL_TABLET | ORAL | Status: DC
  Administered 2023-10-23 – 2023-11-23 (×15): 10 mg via ORAL
  Filled 2023-10-23 (×20): qty 2

## 2023-10-23 MED ORDER — HEPARIN SODIUM (PORCINE) 1000 UNIT/ML IJ SOLN
INTRAMUSCULAR | Status: AC
  Filled 2023-10-23: qty 5

## 2023-10-23 MED ORDER — HEPARIN SODIUM (PORCINE) 1000 UNIT/ML DIALYSIS
1000.0000 [IU] | INTRAMUSCULAR | Status: DC | PRN
Start: 2023-10-23 — End: 2023-10-23
  Administered 2023-10-23: 4200 [IU]

## 2023-10-23 MED ORDER — ALBUMIN HUMAN 25 % IV SOLN
25.0000 g | Freq: Once | INTRAVENOUS | Status: DC
  Filled 2023-10-23: qty 100

## 2023-10-23 NOTE — Progress Notes (Signed)
   10/23/23 1712  Vitals  Temp 98.5 F (36.9 C)  Pulse Rate (!) 119  Resp 15  BP 104/66  SpO2 100 %  O2 Device Room Air  Oxygen Therapy  Pulse Oximetry Type Continuous  Post Treatment  Dialyzer Clearance Lightly streaked  Liters Processed 84  Fluid Removed (mL) 2500 mL   Received patient in bed to unit.  Alert and oriented.  Informed consent signed and in chart.   TX duration:4.0  Patient tolerated well.  Transported back to the room  Alert, without acute distress.  Hand-off given to patient's nurse.   Access used: L fem CVC Access issues: None  Total UF removed: Medication(s) given: See MAR    Laqueta Due, RN [facility]

## 2023-10-23 NOTE — Plan of Care (Signed)
Problem: Health Behavior/Discharge Planning: Goal: Ability to manage health-related needs will improve Outcome: Progressing   Problem: Clinical Measurements: Goal: Ability to maintain clinical measurements within normal limits will improve Outcome: Progressing Goal: Will remain free from infection Outcome: Progressing Goal: Diagnostic test results will improve Outcome: Progressing Goal: Cardiovascular complication will be avoided Outcome: Progressing   Problem: Activity: Goal: Risk for activity intolerance will decrease Outcome: Progressing   Problem: Coping: Goal: Level of anxiety will decrease Outcome: Progressing   Problem: Skin Integrity: Goal: Risk for impaired skin integrity will decrease Outcome: Progressing   Problem: Education: Goal: Ability to describe self-care measures that may prevent or decrease complications (Diabetes Survival Skills Education) will improve Outcome: Progressing Goal: Individualized Educational Video(s) Outcome: Progressing   Problem: Coping: Goal: Ability to adjust to condition or change in health will improve Outcome: Progressing   Problem: Fluid Volume: Goal: Ability to maintain a balanced intake and output will improve Outcome: Progressing   Problem: Health Behavior/Discharge Planning: Goal: Ability to identify and utilize available resources and services will improve Outcome: Progressing Goal: Ability to manage health-related needs will improve Outcome: Progressing   Problem: Metabolic: Goal: Ability to maintain appropriate glucose levels will improve Outcome: Progressing   Problem: Skin Integrity: Goal: Risk for impaired skin integrity will decrease Outcome: Progressing   Problem: Tissue Perfusion: Goal: Adequacy of tissue perfusion will improve Outcome: Progressing   Problem: Education: Goal: Knowledge of disease and its progression will improve Outcome: Progressing Goal: Individualized Educational Video(s) Outcome:  Progressing   Problem: Fluid Volume: Goal: Compliance with measures to maintain balanced fluid volume will improve Outcome: Progressing   Problem: Health Behavior/Discharge Planning: Goal: Ability to manage health-related needs will improve Outcome: Progressing   Problem: Nutritional: Goal: Ability to make healthy dietary choices will improve Outcome: Progressing   Problem: Clinical Measurements: Goal: Complications related to the disease process, condition or treatment will be avoided or minimized Outcome: Progressing

## 2023-10-23 NOTE — Progress Notes (Signed)
PROGRESS NOTE    [name]  JWJ:[id number] DOB: [birth year] DOA: 09/09/2023 PCP: [name], NP   Brief Narrative:  43 y.o. 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  While at [facility] he underwent cardiac cath showing non obstructive disease, craniotomy with abscess drainage on 09/01/23,. He had subarachnoid hemorrhage while on IV heparin, right third toe distal necrosis,was seen by podiatry, but family wanted conservative management. Since his tertiary care needs have been completed, he was transferred back to [facility] for further management.  PTOT  recommending SNF and awaiting placement.   Assessment & Plan:    MRSA TV/MV endocarditis: 10/12, blood cultures positive for MRSA. 10/13, TTE showed LVEF of 25-30% with concern for AV/MV abnormality  10/16 and 10/22, TEE showed MV/TV vegetations.  Currently on IV vancomycin.  Per ID recommendation: complete 8 weeks of treatment EOT 1/29. Recommend outpatient follow up with ID.    Hypotension: Resolved.  Blood pressure intermittently on the higher side. DC'd midodrine on 10/21/2023.     ESRD on HD:  Nephrology on board.  Continue hemodialysis as per nephrology.   Acute CVA /acute metabolic encephalopathy: Mri brain on 10/17 showed large right MCA infarct.  11/25, MRA head showed significant for large necrotic appearing lesion within the right frontal lobe within area of the prior right MCA territory infarct with vasogenic edema and 8 mm leftward midline shift.  Neurology recommendations for Eliquis.  PT/OT recommendations for  SNF. Repeat MRI brain done for increased confusion. Showed decreased size of area of abnormal diffusion restriction within the posterior right frontal operculum compatible with reported abscess. The amount of surrounding edema has also decreased -Neurology has already signed off: Outpatient follow-up with neurology.   Cerebral emboli with abscess/ Cerebritis -Secondary to to MRSA Endocarditis.  -S/p craniotomy and abscess drainage.    Type 1 DM uncontrolled with hyperglycemia and hypoglycemia: -Continue long-acting insulin.  Continue CBGs with SSI.  Carb modified diet.   Acute left upper extremity DVT: -Continue Eliquis.  -Left upper extremity venous duplex showed age-indeterminate DVT involving the left axillary vein.   Anemia of chronic kidney disease: -Hemoglobin currently stable.  Monitor intermittently   Right third toe necrosis: H/o prior left BKA: Physical deconditioning Per documentation, patient/family opted for conservative management and declined surgical management. Patient was seen by PT/OT with recommendation for SNF on discharge.   Prostate fluid collection: Urology evaluated patient at [facility], suggested that its a dilated seminal vesicle and recommended for no surgical intervention.    Mild cognitive dysfunction with behavioral changes.  Currently stable on seroquel.   Obesity -Outpatient follow-up   DVT prophylaxis: Eliquis Code Status: Full Family Communication: None at bedside Disposition Plan: Status is: Inpatient Remains inpatient appropriate because: Of severity of illness.  Need for SNF placement.  Currently medically stable for discharge    Consultants: Nephrology/psychiatry/ID  Procedures: As above  Antimicrobials:  Anti-infectives (From admission, onward)    Start     Dose/Rate Route Frequency Ordered Stop   10/21/23 1230  vancomycin (VANCOCIN) 750 mg in sodium chloride 0.9 % 250 mL IVPB  Status:  Discontinued  750 mg 265 mL/hr  over 60 Minutes Intravenous Every T-Th-Sa (Hemodialysis) 10/21/23 0804 10/21/23 0811   10/21/23 1200  vancomycin (VANCOCIN) 750 mg in sodium chloride 0.9 % 250 mL IVPB        750 mg 250 mL/hr over 60 Minutes Intravenous Every T-Th-Sa (Hemodialysis) 10/21/23 0811 10/28/23 1159   10/16/23 1345  vancomycin (VANCOCIN) 750 mg in sodium chloride 0.9 % 250 mL IVPB  Status:  Discontinued        750 mg 265 mL/hr over 60 Minutes Intravenous Every T-Th-Sa (Hemodialysis) 10/16/23 1257 10/21/23 0804   10/16/23 1200  vancomycin (VANCOREADY) IVPB 750 mg/150 mL  Status:  Discontinued        750 mg 150 mL/hr over 60 Minutes Intravenous Every T-Th-Sa (Hemodialysis) 10/14/23 1843 10/16/23 1257   10/14/23 1845  vancomycin (VANCOREADY) IVPB 500 mg/100 mL        500 mg 100 mL/hr over 60 Minutes Intravenous  Once 10/14/23 1843 10/15/23 0806   10/14/23 1200  vancomycin (VANCOREADY) IVPB 500 mg/100 mL  Status:  Discontinued        500 mg 100 mL/hr over 60 Minutes Intravenous Every T-Th-Sa (Hemodialysis) 10/13/23 1309 10/14/23 1843   10/13/23 1200  vancomycin (VANCOREADY) IVPB 500 mg/100 mL  Status:  Discontinued        500 mg 100 mL/hr over 60 Minutes Intravenous Every M-W-F (Hemodialysis) 10/11/23 1517 10/13/23 1309   10/12/23 1200  vancomycin (VANCOREADY) IVPB 500 mg/100 mL  Status:  Discontinued        500 mg 100 mL/hr over 60 Minutes Intravenous Every T-Th-Sa (Hemodialysis) 10/11/23 1514 10/11/23 1517   09/24/23 1200  vancomycin (VANCOREADY) IVPB 500 mg/100 mL  Status:  Discontinued        500 mg 100 mL/hr over 60 Minutes Intravenous Every M-W-F (Hemodialysis) 09/21/23 1246 10/11/23 1514   09/20/23 1200  vancomycin (VANCOCIN) 750 mg in sodium chloride 0.9 % 250 mL IVPB  Status:  Discontinued        750 mg 265 mL/hr over 60 Minutes Intravenous Every M-W-F (Hemodialysis) 09/20/23 0446 09/21/23 1246   09/20/23 1045  vancomycin (VANCOCIN) 750 mg in sodium chloride 0.9 % 250 mL IVPB        750 mg 265 mL/hr over  60 Minutes Intravenous  Once 09/20/23 0959 09/20/23 1226   09/20/23 0945  vancomycin (VANCOREADY) IVPB 750 mg/150 mL  Status:  Discontinued        750 mg 150 mL/hr over 60 Minutes Intravenous  Once 09/20/23 0855 09/20/23 0959   09/10/23 1200  vancomycin (VANCOREADY) IVPB 750 mg/150 mL  Status:  Discontinued        750 mg 150 mL/hr over 60 Minutes Intravenous Every M-W-F (Hemodialysis) 09/09/23 2030 09/20/23 0446   09/09/23 2200  linezolid (ZYVOX) tablet 600 mg  Status:  Discontinued        600 mg Oral Every 12 hours 09/09/23 2035 09/10/23 0946        Subjective: Patient seen and examined at bedside.  No fever, agitation, vomiting reported  objective: Vitals:   10/22/23 0554 10/22/23 0939 10/22/23 2132 10/23/23 0510  BP: 110/70 (!) 144/85 (!) 128/93 126/71  Pulse: (!) 114 67 (!) 118 (!) 110  Resp: 18 18 20 18   Temp: 98.6 F (37 C)  98.7 F (37.1 C) 98.2 F (36.8 C)  TempSrc:   Oral Oral  SpO2: 93% 90% 94% 96%  Weight:      Height:        Intake/Output  Summary (Last 24 hours) at 10/23/2023 0817 Last data filed at 10/22/2023 2309 Gross per 24 hour  Intake 480 ml  Output --  Net 480 ml   Filed Weights   10/19/23 1229 10/19/23 1626 10/21/23 1744  Weight: 95.1 kg 96.3 kg 95 kg    Examination:  General: Remains on room air.  No distress.  Slow to respond.  Poor historian.  Chronically ill and deconditioned looking. respiratory: Decreased breath sounds at bases bilaterally with some crackles  CVS: Rate currently controlled; S1 and S2 heard abdominal: Soft, nontender, remains slightly distended; no organomegaly; normal bowel sounds heard extremities: Left BKA present.  Mild right lower extremity edema present   Data Reviewed: I have personally reviewed following labs and imaging studies  CBC: Recent Labs  Lab 10/17/23 1121 10/19/23 1035  WBC 6.3 5.8  NEUTROABS 3.8  --   HGB 9.5* 10.1*  HCT 31.5* 33.6*  MCV 91.8 92.1  PLT 148* 179   Basic Metabolic  Panel: Recent Labs  Lab 10/16/23 2119 10/17/23 1121 10/19/23 1035 10/19/23 1740  NA  --  136 137 137  K 4.4 4.9 5.2* 4.5  CL  --  96* 94* 95*  CO2  --  25 24 25   GLUCOSE  --  137* 75 143*  BUN  --  38* 56* 29*  CREATININE  --  5.72* 7.67* 4.33*  CALCIUM  --  9.7 9.7 9.3  PHOS  --   --  7.9*  --    GFR: Estimated Creatinine Clearance: 23.9 mL/min (A) (by C-G formula based on SCr of 4.33 mg/dL (H)). Liver Function Tests: Recent Labs  Lab 10/19/23 1035 10/19/23 1740  AST  --  19  20  ALT  --  8  10  ALKPHOS  --  150*  150*  BILITOT  --  1.3*  1.3*  PROT  --  7.8  7.7  ALBUMIN 3.6 3.8  3.9   No results for input(s): "LIPASE", "AMYLASE" in the last 168 hours. No results for input(s): "AMMONIA" in the last 168 hours. Coagulation Profile: No results for input(s): "INR", "PROTIME" in the last 168 hours. Cardiac Enzymes: No results for input(s): "CKTOTAL", "CKMB", "CKMBINDEX", "TROPONINI" in the last 168 hours. BNP (last 3 results) No results for input(s): "PROBNP" in the last 8760 hours. HbA1C: No results for input(s): "HGBA1C" in the last 72 hours. CBG: Recent Labs  Lab 10/22/23 0744 10/22/23 1214 10/22/23 1559 10/22/23 2128 10/23/23 0747  GLUCAP 196* 273* 257* 286* 146*   Lipid Profile: No results for input(s): "CHOL", "HDL", "LDLCALC", "TRIG", "CHOLHDL", "LDLDIRECT" in the last 72 hours. Thyroid Function Tests: No results for input(s): "TSH", "T4TOTAL", "FREET4", "T3FREE", "THYROIDAB" in the last 72 hours. Anemia Panel: No results for input(s): "VITAMINB12", "FOLATE", "FERRITIN", "TIBC", "IRON", "RETICCTPCT" in the last 72 hours. Sepsis Labs: No results for input(s): "PROCALCITON", "LATICACIDVEN" in the last 168 hours.  No results found for this or any previous visit (from the past 240 hours).       Radiology Studies: No results found.      Scheduled Meds:  (feeding supplement) PROSource Plus  30 mL Oral BID BM   apixaban  5 mg Oral BID    bacitracin   Topical Daily   Chlorhexidine Gluconate Cloth  6 each Topical Q0600   darbepoetin (ARANESP) injection - DIALYSIS  100 mcg Subcutaneous Q Sun-1800   dextrose  1 Tube Oral Once   gabapentin  100 mg Oral TID   Gerhardt's butt  cream   Topical BID   influenza vac split trivalent PF  0.5 mL Intramuscular Tomorrow-1000   insulin aspart  0-5 Units Subcutaneous QHS   insulin aspart  0-6 Units Subcutaneous TID WC   insulin glargine-yfgn  8 Units Subcutaneous Daily   loratadine  10 mg Oral Daily   pantoprazole  40 mg Oral Daily   povidone-Iodine   Topical Q0600   QUEtiapine  12.5 mg Oral q morning   QUEtiapine  25 mg Oral QHS   senna-docusate  2 tablet Oral BID   sevelamer carbonate  2,400 mg Oral TID WC   sodium zirconium cyclosilicate  10 g Oral TID   Continuous Infusions:  albumin human 25 g (10/21/23 1749)   vancomycin (VANCOCIN) 750 mg in sodium chloride 0.9 % 250 mL IVPB 750 mg (10/21/23 2301)          Glade Lloyd, MD [facility] 10/23/2023, 8:17 AM

## 2023-10-23 NOTE — Progress Notes (Signed)
Writer went to check on patient and to readjust BP cuff and retake V/S, patient states to leave him along and he is gonna start cussing everyone out. Inform patient that I wanted to check on his well-being and make sure is feeling okay. Patient continues to take N/C off, stating "I don't need all these wires". Writer inform patient that it is his O2.

## 2023-10-23 NOTE — Progress Notes (Signed)
[facility] Progress Note   Subjective:   Dispo plan still pending. Discussed plan for HD today, pt initially refused because it is not his "normal day." Explained rationale for schedule change and importance of HD compliance and he reports he will go to dialysis today. Denies SOB, CP, dizziness.   Objective Vitals:   10/22/23 0554 10/22/23 0939 10/22/23 2132 10/23/23 0510  BP: 110/70 (!) 144/85 (!) 128/93 126/71  Pulse: (!) 114 67 (!) 118 (!) 110  Resp: 18 18 20 18   Temp: 98.6 F (37 C)  98.7 F (37.1 C) 98.2 F (36.8 C)  TempSrc:   Oral Oral  SpO2: 93% 90% 94% 96%  Weight:      Height:       Physical Exam General: Alert male in NAD Heart: RRR, no murmurs, rubs or gallops Lungs: CTA bilaterally, respirations unlabored on RA Abdomen: Soft, +BS Extremities: 1+ upper and lower extremity edema Dialysis Access: L femoral TDC  Additional Objective Labs: Basic Metabolic Panel: Recent Labs  Lab 10/17/23 1121 10/19/23 1035 10/19/23 1740  NA 136 137 137  K 4.9 5.2* 4.5  CL 96* 94* 95*  CO2 25 24 25   GLUCOSE 137* 75 143*  BUN 38* 56* 29*  CREATININE 5.72* 7.67* 4.33*  CALCIUM 9.7 9.7 9.3  PHOS  --  7.9*  --    Liver Function Tests: Recent Labs  Lab 10/19/23 1035 10/19/23 1740  AST  --  19  20  ALT  --  8  10  ALKPHOS  --  150*  150*  BILITOT  --  1.3*  1.3*  PROT  --  7.8  7.7  ALBUMIN 3.6 3.8  3.9   No results for input(s): "LIPASE", "AMYLASE" in the last 168 hours. CBC: Recent Labs  Lab 10/17/23 1121 10/19/23 1035  WBC 6.3 5.8  NEUTROABS 3.8  --   HGB 9.5* 10.1*  HCT 31.5* 33.6*  MCV 91.8 92.1  PLT 148* 179   Blood Culture    Component Value Date/Time   SDES BLOOD SITE NOT SPECIFIED 08/25/2023 1706   SPECREQUEST  08/25/2023 1706    BOTTLES DRAWN AEROBIC ONLY Blood Culture results may not be optimal due to an inadequate volume of blood received in culture bottles   CULT  08/25/2023 1706    NO GROWTH 5 DAYS Performed at [facility], [address] [place], [name] [address]    REPTSTATUS 08/30/2023 FINAL 08/25/2023 1706    Cardiac Enzymes: No results for input(s): "CKTOTAL", "CKMB", "CKMBINDEX", "TROPONINI" in the last 168 hours. CBG: Recent Labs  Lab 10/22/23 0744 10/22/23 1214 10/22/23 1559 10/22/23 2128 10/23/23 0747  GLUCAP 196* 273* 257* 286* 146*   Iron Studies: No results for input(s): "IRON", "TIBC", "TRANSFERRIN", "FERRITIN" in the last 72 hours. @lablastinr3 @ Studies/Results: No results found. Medications:  albumin human 25 g (10/21/23 1749)   vancomycin (VANCOCIN) 750 mg in sodium chloride 0.9 % 250 mL IVPB 750 mg (10/21/23 2301)    (feeding supplement) PROSource Plus  30 mL Oral BID BM   apixaban  5 mg Oral BID   bacitracin   Topical Daily   Chlorhexidine Gluconate Cloth  6 each Topical Q0600   darbepoetin (ARANESP) injection - DIALYSIS  100 mcg Subcutaneous Q Sun-1800   dextrose  1 Tube Oral Once   gabapentin  100 mg Oral TID   Gerhardt's butt cream   Topical BID   influenza vac split trivalent PF  0.5 mL Intramuscular Tomorrow-1000  insulin aspart  0-5 Units Subcutaneous QHS   insulin aspart  0-6 Units Subcutaneous TID WC   insulin glargine-yfgn  8 Units Subcutaneous Daily   loratadine  10 mg Oral Daily   pantoprazole  40 mg Oral Daily   povidone-Iodine   Topical Q0600   QUEtiapine  12.5 mg Oral q morning   QUEtiapine  25 mg Oral QHS   senna-docusate  2 tablet Oral BID   sevelamer carbonate  2,400 mg Oral TID WC   sodium zirconium cyclosilicate  10 g Oral TID    Dialysis Orders: MWF - [facility] -> will be going to [place] [facility] on discharge 4hr, 400/A1.5, EDW 77kg, L femoral TDC, 2K/3Ca bath, no heparin  Assessment/Plan: MRSA bacteremia with MV/TV endocarditis + brain abscess:  Not a candidate for valve surgery, S/p line holiday and brain abscess drainage (at [facility]). Continues on extended Vanc course - will be on Vanc IV q HD until 10/27/23. 2. MRSA brain  abscess: S/p transfer to [facility] and drainage 08/31/24. Repeat brain MRI 10/17/23 showed improvement. 3. Acute B CVA/septic emboli- due to above 4. Acute LUE DVT: On Eliquis 5. ESRD: Transitioned to TTS, next HD 1/25 6. HTN/volume: BP stable, CT with diffuse anasarca. Significantly above EDW. Max UF with HD. Continue to try to enforce fluid restrictions/compliance with full HD time 7. 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 Last 1/19.  No IV iron while on abx. 8. Secondary HPTH: CorrCa/Phos high. Continue to hold calcitriol. Sevelamer increased to 3/meals. 9. Hyperkalemia: Persistently high despite Lokelma 10g TID, using 1K bath intermittently with HD 10. Nutrition: Alb low, continue supplements.  11. T1DM: per primary 12. Dispo: SNF being arranged - not finalized yet  Rogers Blocker, PA-C 10/23/2023, 8:51 AM  [facility] Pager: [phone]

## 2023-10-24 DIAGNOSIS — N186 End stage renal disease: Secondary | ICD-10-CM | POA: Diagnosis not present

## 2023-10-24 DIAGNOSIS — I38 Endocarditis, valve unspecified: Secondary | ICD-10-CM | POA: Diagnosis not present

## 2023-10-24 DIAGNOSIS — B9562 Methicillin resistant Staphylococcus aureus infection as the cause of diseases classified elsewhere: Secondary | ICD-10-CM | POA: Diagnosis not present

## 2023-10-24 DIAGNOSIS — I330 Acute and subacute infective endocarditis: Secondary | ICD-10-CM | POA: Diagnosis not present

## 2023-10-24 DIAGNOSIS — I120 Hypertensive chronic kidney disease with stage 5 chronic kidney disease or end stage renal disease: Secondary | ICD-10-CM | POA: Diagnosis not present

## 2023-10-24 DIAGNOSIS — D631 Anemia in chronic kidney disease: Secondary | ICD-10-CM | POA: Diagnosis not present

## 2023-10-24 DIAGNOSIS — Z992 Dependence on renal dialysis: Secondary | ICD-10-CM | POA: Diagnosis not present

## 2023-10-24 DIAGNOSIS — R7881 Bacteremia: Secondary | ICD-10-CM | POA: Diagnosis not present

## 2023-10-24 DIAGNOSIS — N250 Renal osteodystrophy: Secondary | ICD-10-CM | POA: Diagnosis not present

## 2023-10-24 LAB — GLUCOSE, CAPILLARY
Glucose-Capillary: 116 mg/dL — ABNORMAL HIGH (ref 70–99)
Glucose-Capillary: 169 mg/dL — ABNORMAL HIGH (ref 70–99)
Glucose-Capillary: 151 mg/dL — ABNORMAL HIGH (ref 70–99)
Glucose-Capillary: 195 mg/dL — ABNORMAL HIGH (ref 70–99)

## 2023-10-24 MED ORDER — INSULIN GLARGINE-YFGN 100 UNIT/ML ~~LOC~~ SOLN
6.0000 [IU] | Freq: Every day | SUBCUTANEOUS | Status: DC
  Administered 2023-10-25 – 2023-10-28 (×4): 6 [IU] via SUBCUTANEOUS
  Filled 2023-10-24 (×5): qty 0.06

## 2023-10-24 NOTE — Progress Notes (Addendum)
[facility] Progress Note   Subjective:   Denies new concerns today. Denies SOB, CP, dizziness, nausea.   Objective Vitals:   10/23/23 1707 10/23/23 1712 10/23/23 2025 10/24/23 0757  BP: (!) 94/55 104/66 (!) 152/112 (!) 108/97  Pulse: (!) 112 (!) 119 (!) 113 (!) 121  Resp: 19 15 18    Temp:  98.5 F (36.9 C) 98.3 F (36.8 C)   TempSrc:      SpO2: 91% 100% 97% 96%  Weight:      Height:       Physical Exam General: Alert male in NAD Heart: RRR, no murmurs, rubs or gallops Lungs: CTA bilaterally, respirations unlabored on RA Abdomen: Soft, +BS Extremities: no edema b/l lower extremities, 1+ edema b/l upper extremities Dialysis Access: L femoral TDC  Additional Objective Labs: Basic Metabolic Panel: Recent Labs  Lab 10/17/23 1121 10/19/23 1035 10/19/23 1740  NA 136 137 137  K 4.9 5.2* 4.5  CL 96* 94* 95*  CO2 25 24 25   GLUCOSE 137* 75 143*  BUN 38* 56* 29*  CREATININE 5.72* 7.67* 4.33*  CALCIUM 9.7 9.7 9.3  PHOS  --  7.9*  --    Liver Function Tests: Recent Labs  Lab 10/19/23 1035 10/19/23 1740  AST  --  19  20  ALT  --  8  10  ALKPHOS  --  150*  150*  BILITOT  --  1.3*  1.3*  PROT  --  7.8  7.7  ALBUMIN 3.6 3.8  3.9   No results for input(s): "LIPASE", "AMYLASE" in the last 168 hours. CBC: Recent Labs  Lab 10/17/23 1121 10/19/23 1035  WBC 6.3 5.8  NEUTROABS 3.8  --   HGB 9.5* 10.1*  HCT 31.5* 33.6*  MCV 91.8 92.1  PLT 148* 179   Blood Culture    Component Value Date/Time   SDES BLOOD SITE NOT SPECIFIED 08/25/2023 1706   SPECREQUEST  08/25/2023 1706    BOTTLES DRAWN AEROBIC ONLY Blood Culture results may not be optimal due to an inadequate volume of blood received in culture bottles   CULT  08/25/2023 1706    NO GROWTH 5 DAYS Performed at [facility], [address] [place], [name] [address]    REPTSTATUS 08/30/2023 FINAL 08/25/2023 1706    Cardiac Enzymes: No results for input(s): "CKTOTAL", "CKMB",  "CKMBINDEX", "TROPONINI" in the last 168 hours. CBG: Recent Labs  Lab 10/23/23 0747 10/23/23 1159 10/23/23 1723 10/23/23 2024 10/24/23 0755  GLUCAP 146* 187* 111* 108* 116*   Iron Studies: No results for input(s): "IRON", "TIBC", "TRANSFERRIN", "FERRITIN" in the last 72 hours. @lablastinr3 @ Studies/Results: No results found. Medications:  albumin human     vancomycin (VANCOCIN) 750 mg in sodium chloride 0.9 % 250 mL IVPB Stopped (10/23/23 1824)    (feeding supplement) PROSource Plus  30 mL Oral BID BM   apixaban  5 mg Oral BID   bacitracin   Topical Daily   Chlorhexidine Gluconate Cloth  6 each Topical Q0600   darbepoetin (ARANESP) injection - DIALYSIS  100 mcg Subcutaneous Q Sun-1800   dextrose  1 Tube Oral Once   gabapentin  100 mg Oral TID   Gerhardt's butt cream   Topical BID   influenza vac split trivalent PF  0.5 mL Intramuscular Tomorrow-1000   insulin aspart  0-5 Units Subcutaneous QHS   insulin aspart  0-6 Units Subcutaneous TID WC   insulin glargine-yfgn  8 Units Subcutaneous Daily   loratadine  10  mg Oral Daily   midodrine  10 mg Oral Q T,Th,Sa-HD   pantoprazole  40 mg Oral Daily   povidone-Iodine   Topical Q0600   QUEtiapine  12.5 mg Oral q morning   QUEtiapine  25 mg Oral QHS   senna-docusate  2 tablet Oral BID   sevelamer carbonate  2,400 mg Oral TID WC   sodium zirconium cyclosilicate  10 g Oral TID    Dialysis Orders: MWF - [facility] -> will be going to [place] [facility] on discharge 4hr, 400/A1.5, EDW 77kg, L femoral TDC, 2K/3Ca bath, no heparin  Assessment/Plan: MRSA bacteremia with MV/TV endocarditis + brain abscess:  Not a candidate for valve surgery, S/p line holiday and brain abscess drainage (at [facility]). Continues on extended Vanc course - will be on Vanc IV q HD until 10/27/23. S/p transfer to [facility] and drainage 08/31/24. Repeat brain MRI 10/17/23 showed improvement. 3. Acute B CVA/septic emboli- due to above 4. Acute LUE DVT: On  Eliquis 5. ESRD: Transitioned to TTS, next HD Tuesday 6. HTN/volume: BP stable, CT with diffuse anasarca. Significantly above EDW. Max UF with HD. Continue to try to enforce fluid restrictions/compliance with full HD time 7. Anemia of ESRD: Hgb 10.1, Aranesp to 150 mcg q Sunday while here. Last 1/19.  No IV iron while on abx. 8. Secondary HPTH: CorrCa/Phos high. Continue to hold calcitriol. Sevelamer increased to 3/meals. 9. Hyperkalemia: Persistently high despite Lokelma 10g TID, using 1K bath intermittently with HD. No lab results from yesterday, repeat RFP tomorrow AM 10. Nutrition: Alb low, continue supplements.  11. T1DM: per primary 12. Dispo: SNF being arranged - not finalized yet  Rogers Blocker, PA-C 10/24/2023, 9:37 AM  [facility] Pager: [phone]  I was available and have reviewed and agree with the plan.  Vinson Moselle  MD  [facility] 10/24/2023, 8:38 PM

## 2023-10-24 NOTE — Progress Notes (Signed)
PROGRESS NOTE    [name]  ZOX:[id number] DOB: 08-06-1981 DOA: 09/09/2023 PCP: [name], NP   Brief Narrative:  43 y.o. 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  While at [facility] he underwent cardiac cath showing non obstructive disease, craniotomy with abscess drainage on 09/01/23,. He had subarachnoid hemorrhage while on IV heparin, right third toe distal necrosis,was seen by podiatry, but family wanted conservative management. Since his tertiary care needs have been completed, he was transferred back to [facility] for further management.  PTOT  recommending SNF and awaiting placement.   Assessment & Plan:    MRSA TV/MV endocarditis: 10/12, blood cultures positive for MRSA. 10/13, TTE showed LVEF of 25-30% with concern for AV/MV abnormality  10/16 and 10/22, TEE showed MV/TV vegetations.  Currently on IV vancomycin.  Per ID recommendation: complete 8 weeks of treatment EOT 1/29. Recommend outpatient follow up with ID.    Hypotension: Resolved.  Blood pressure intermittently on the higher side. DC'd midodrine on 10/21/2023.     ESRD on HD:  Nephrology on board.  Continue hemodialysis as per nephrology.   Acute CVA /acute metabolic encephalopathy: Mri brain on 10/17 showed large right MCA infarct.  11/25, MRA head showed significant for large necrotic appearing lesion within the right frontal lobe within area of the prior right MCA territory infarct with vasogenic edema and 8 mm leftward midline shift.  Neurology recommendations for Eliquis.  PT/OT recommendations for  SNF. Repeat MRI brain done for increased confusion. Showed decreased size of area of abnormal diffusion restriction within the posterior right frontal operculum compatible with reported abscess. The amount of surrounding edema has also decreased -Neurology has already signed off: Outpatient follow-up with neurology.   Cerebral emboli with abscess/ Cerebritis -Secondary to to MRSA Endocarditis.  -S/p craniotomy and abscess drainage.    Type 1 DM uncontrolled with hyperglycemia and hypoglycemia: -Continue long-acting insulin.  Continue CBGs with SSI.  Carb modified diet.   Acute left upper extremity DVT: -Continue Eliquis.  -Left upper extremity venous duplex showed age-indeterminate DVT involving the left axillary vein.   Anemia of chronic kidney disease: -Hemoglobin currently stable.  Monitor intermittently   Right third toe necrosis: H/o prior left BKA: Physical deconditioning Per documentation, patient/family opted for conservative management and declined surgical management. Patient was seen by PT/OT with recommendation for SNF on discharge.   Prostate fluid collection: Urology evaluated patient at [facility], suggested that its a dilated seminal vesicle and recommended for no surgical intervention.    Mild cognitive dysfunction with behavioral changes.  Currently stable on seroquel.   Obesity -Outpatient follow-up   DVT prophylaxis: Eliquis Code Status: Full Family Communication: None at bedside Disposition Plan: Status is: Inpatient Remains inpatient appropriate because: Of severity of illness.  Need for SNF placement.  Currently medically stable for discharge    Consultants: Nephrology/psychiatry/ID  Procedures: As above  Antimicrobials:  Anti-infectives (From admission, onward)    Start     Dose/Rate Route Frequency Ordered Stop   10/21/23 1230  vancomycin (VANCOCIN) 750 mg in sodium chloride 0.9 % 250 mL IVPB  Status:  Discontinued  750 mg 265 mL/hr  over 60 Minutes Intravenous Every T-Th-Sa (Hemodialysis) 10/21/23 0804 10/21/23 0811   10/21/23 1200  vancomycin (VANCOCIN) 750 mg in sodium chloride 0.9 % 250 mL IVPB        750 mg 250 mL/hr over 60 Minutes Intravenous Every T-Th-Sa (Hemodialysis) 10/21/23 0811 10/28/23 1159   10/16/23 1345  vancomycin (VANCOCIN) 750 mg in sodium chloride 0.9 % 250 mL IVPB  Status:  Discontinued        750 mg 265 mL/hr over 60 Minutes Intravenous Every T-Th-Sa (Hemodialysis) 10/16/23 1257 10/21/23 0804   10/16/23 1200  vancomycin (VANCOREADY) IVPB 750 mg/150 mL  Status:  Discontinued        750 mg 150 mL/hr over 60 Minutes Intravenous Every T-Th-Sa (Hemodialysis) 10/14/23 1843 10/16/23 1257   10/14/23 1845  vancomycin (VANCOREADY) IVPB 500 mg/100 mL        500 mg 100 mL/hr over 60 Minutes Intravenous  Once 10/14/23 1843 10/15/23 0806   10/14/23 1200  vancomycin (VANCOREADY) IVPB 500 mg/100 mL  Status:  Discontinued        500 mg 100 mL/hr over 60 Minutes Intravenous Every T-Th-Sa (Hemodialysis) 10/13/23 1309 10/14/23 1843   10/13/23 1200  vancomycin (VANCOREADY) IVPB 500 mg/100 mL  Status:  Discontinued        500 mg 100 mL/hr over 60 Minutes Intravenous Every M-W-F (Hemodialysis) 10/11/23 1517 10/13/23 1309   10/12/23 1200  vancomycin (VANCOREADY) IVPB 500 mg/100 mL  Status:  Discontinued        500 mg 100 mL/hr over 60 Minutes Intravenous Every T-Th-Sa (Hemodialysis) 10/11/23 1514 10/11/23 1517   09/24/23 1200  vancomycin (VANCOREADY) IVPB 500 mg/100 mL  Status:  Discontinued        500 mg 100 mL/hr over 60 Minutes Intravenous Every M-W-F (Hemodialysis) 09/21/23 1246 10/11/23 1514   09/20/23 1200  vancomycin (VANCOCIN) 750 mg in sodium chloride 0.9 % 250 mL IVPB  Status:  Discontinued        750 mg 265 mL/hr over 60 Minutes Intravenous Every M-W-F (Hemodialysis) 09/20/23 0446 09/21/23 1246   09/20/23 1045  vancomycin (VANCOCIN) 750 mg in sodium chloride 0.9 % 250 mL IVPB        750 mg 265 mL/hr over  60 Minutes Intravenous  Once 09/20/23 0959 09/20/23 1226   09/20/23 0945  vancomycin (VANCOREADY) IVPB 750 mg/150 mL  Status:  Discontinued        750 mg 150 mL/hr over 60 Minutes Intravenous  Once 09/20/23 0855 09/20/23 0959   09/10/23 1200  vancomycin (VANCOREADY) IVPB 750 mg/150 mL  Status:  Discontinued        750 mg 150 mL/hr over 60 Minutes Intravenous Every M-W-F (Hemodialysis) 09/09/23 2030 09/20/23 0446   09/09/23 2200  linezolid (ZYVOX) tablet 600 mg  Status:  Discontinued        600 mg Oral Every 12 hours 09/09/23 2035 09/10/23 0946        Subjective: Patient seen and examined at bedside.  No agitation, seizures, vomiting or chest pain reported  objective: Vitals:   10/23/23 1707 10/23/23 1712 10/23/23 2025 10/24/23 0757  BP: (!) 94/55 104/66 (!) 152/112 (!) 108/97  Pulse: (!) 112 (!) 119 (!) 113 (!) 121  Resp: 19 15 18    Temp:  98.5 F (36.9 C) 98.3 F (36.8 C)   TempSrc:      SpO2: 91% 100% 97% 96%  Weight:      Height:  Intake/Output Summary (Last 24 hours) at 10/24/2023 0806 Last data filed at 10/23/2023 1712 Gross per 24 hour  Intake 240 ml  Output 2500 ml  Net -2260 ml   Filed Weights   10/19/23 1229 10/19/23 1626 10/21/23 1744  Weight: 95.1 kg 96.3 kg 95 kg    Examination:  General: No acute distress.  Currently on room air.  Slow to respond.  Poor historian.  Chronically ill and deconditioned looking. respiratory: Bilateral decreased breath sounds at bases with scattered crackles  CVS: Mild intermittent tachycardia present; S1 and S2 are heard  abdominal: Soft, nontender, distended still; no organomegaly;  bowel sounds heard normally extremities: Left BKA present.  Trace right lower extremity edema present  Data Reviewed: I have personally reviewed following labs and imaging studies  CBC: Recent Labs  Lab 10/17/23 1121 10/19/23 1035  WBC 6.3 5.8  NEUTROABS 3.8  --   HGB 9.5* 10.1*  HCT 31.5* 33.6*  MCV 91.8 92.1  PLT 148* 179    Basic Metabolic Panel: Recent Labs  Lab 10/17/23 1121 10/19/23 1035 10/19/23 1740  NA 136 137 137  K 4.9 5.2* 4.5  CL 96* 94* 95*  CO2 25 24 25   GLUCOSE 137* 75 143*  BUN 38* 56* 29*  CREATININE 5.72* 7.67* 4.33*  CALCIUM 9.7 9.7 9.3  PHOS  --  7.9*  --    GFR: Estimated Creatinine Clearance: 23.9 mL/min (A) (by C-G formula based on SCr of 4.33 mg/dL (H)). Liver Function Tests: Recent Labs  Lab 10/19/23 1035 10/19/23 1740  AST  --  19  20  ALT  --  8  10  ALKPHOS  --  150*  150*  BILITOT  --  1.3*  1.3*  PROT  --  7.8  7.7  ALBUMIN 3.6 3.8  3.9   No results for input(s): "LIPASE", "AMYLASE" in the last 168 hours. No results for input(s): "AMMONIA" in the last 168 hours. Coagulation Profile: No results for input(s): "INR", "PROTIME" in the last 168 hours. Cardiac Enzymes: No results for input(s): "CKTOTAL", "CKMB", "CKMBINDEX", "TROPONINI" in the last 168 hours. BNP (last 3 results) No results for input(s): "PROBNP" in the last 8760 hours. HbA1C: No results for input(s): "HGBA1C" in the last 72 hours. CBG: Recent Labs  Lab 10/23/23 0747 10/23/23 1159 10/23/23 1723 10/23/23 2024 10/24/23 0755  GLUCAP 146* 187* 111* 108* 116*   Lipid Profile: No results for input(s): "CHOL", "HDL", "LDLCALC", "TRIG", "CHOLHDL", "LDLDIRECT" in the last 72 hours. Thyroid Function Tests: No results for input(s): "TSH", "T4TOTAL", "FREET4", "T3FREE", "THYROIDAB" in the last 72 hours. Anemia Panel: No results for input(s): "VITAMINB12", "FOLATE", "FERRITIN", "TIBC", "IRON", "RETICCTPCT" in the last 72 hours. Sepsis Labs: No results for input(s): "PROCALCITON", "LATICACIDVEN" in the last 168 hours.  No results found for this or any previous visit (from the past 240 hours).       Radiology Studies: No results found.      Scheduled Meds:  (feeding supplement) PROSource Plus  30 mL Oral BID BM   apixaban  5 mg Oral BID   bacitracin   Topical Daily    Chlorhexidine Gluconate Cloth  6 each Topical Q0600   darbepoetin (ARANESP) injection - DIALYSIS  100 mcg Subcutaneous Q Sun-1800   dextrose  1 Tube Oral Once   gabapentin  100 mg Oral TID   Gerhardt's butt cream   Topical BID   influenza vac split trivalent PF  0.5 mL Intramuscular Tomorrow-1000   insulin aspart  0-5 Units Subcutaneous QHS   insulin aspart  0-6 Units Subcutaneous TID WC   insulin glargine-yfgn  8 Units Subcutaneous Daily   loratadine  10 mg Oral Daily   midodrine  10 mg Oral Q T,Th,Sa-HD   pantoprazole  40 mg Oral Daily   povidone-Iodine   Topical Q0600   QUEtiapine  12.5 mg Oral q morning   QUEtiapine  25 mg Oral QHS   senna-docusate  2 tablet Oral BID   sevelamer carbonate  2,400 mg Oral TID WC   sodium zirconium cyclosilicate  10 g Oral TID   Continuous Infusions:  albumin human     vancomycin (VANCOCIN) 750 mg in sodium chloride 0.9 % 250 mL IVPB Stopped (10/23/23 1824)          Glade Lloyd, MD [facility] 10/24/2023, 8:06 AM

## 2023-10-24 NOTE — Plan of Care (Signed)
Problem: Health Behavior/Discharge Planning: Goal: Ability to manage health-related needs will improve Outcome: Progressing   Problem: Clinical Measurements: Goal: Ability to maintain clinical measurements within normal limits will improve Outcome: Progressing Goal: Will remain free from infection Outcome: Progressing Goal: Diagnostic test results will improve Outcome: Progressing Goal: Cardiovascular complication will be avoided Outcome: Progressing   Problem: Activity: Goal: Risk for activity intolerance will decrease Outcome: Progressing   Problem: Coping: Goal: Level of anxiety will decrease Outcome: Progressing   Problem: Skin Integrity: Goal: Risk for impaired skin integrity will decrease Outcome: Progressing   Problem: Education: Goal: Ability to describe self-care measures that may prevent or decrease complications (Diabetes Survival Skills Education) will improve Outcome: Progressing Goal: Individualized Educational Video(s) Outcome: Progressing   Problem: Coping: Goal: Ability to adjust to condition or change in health will improve Outcome: Progressing   Problem: Fluid Volume: Goal: Ability to maintain a balanced intake and output will improve Outcome: Progressing   Problem: Health Behavior/Discharge Planning: Goal: Ability to identify and utilize available resources and services will improve Outcome: Progressing Goal: Ability to manage health-related needs will improve Outcome: Progressing   Problem: Metabolic: Goal: Ability to maintain appropriate glucose levels will improve Outcome: Progressing   Problem: Skin Integrity: Goal: Risk for impaired skin integrity will decrease Outcome: Progressing   Problem: Tissue Perfusion: Goal: Adequacy of tissue perfusion will improve Outcome: Progressing   Problem: Education: Goal: Knowledge of disease and its progression will improve Outcome: Progressing Goal: Individualized Educational Video(s) Outcome:  Progressing   Problem: Fluid Volume: Goal: Compliance with measures to maintain balanced fluid volume will improve Outcome: Progressing   Problem: Health Behavior/Discharge Planning: Goal: Ability to manage health-related needs will improve Outcome: Progressing   Problem: Nutritional: Goal: Ability to make healthy dietary choices will improve Outcome: Progressing   Problem: Clinical Measurements: Goal: Complications related to the disease process, condition or treatment will be avoided or minimized Outcome: Progressing

## 2023-10-25 DIAGNOSIS — D631 Anemia in chronic kidney disease: Secondary | ICD-10-CM | POA: Diagnosis not present

## 2023-10-25 DIAGNOSIS — Z992 Dependence on renal dialysis: Secondary | ICD-10-CM | POA: Diagnosis not present

## 2023-10-25 DIAGNOSIS — N250 Renal osteodystrophy: Secondary | ICD-10-CM | POA: Diagnosis not present

## 2023-10-25 DIAGNOSIS — N186 End stage renal disease: Secondary | ICD-10-CM | POA: Diagnosis not present

## 2023-10-25 DIAGNOSIS — B9562 Methicillin resistant Staphylococcus aureus infection as the cause of diseases classified elsewhere: Secondary | ICD-10-CM | POA: Diagnosis not present

## 2023-10-25 DIAGNOSIS — I38 Endocarditis, valve unspecified: Secondary | ICD-10-CM | POA: Diagnosis not present

## 2023-10-25 DIAGNOSIS — R7881 Bacteremia: Secondary | ICD-10-CM | POA: Diagnosis not present

## 2023-10-25 DIAGNOSIS — I120 Hypertensive chronic kidney disease with stage 5 chronic kidney disease or end stage renal disease: Secondary | ICD-10-CM | POA: Diagnosis not present

## 2023-10-25 DIAGNOSIS — I330 Acute and subacute infective endocarditis: Secondary | ICD-10-CM | POA: Diagnosis not present

## 2023-10-25 LAB — RENAL FUNCTION PANEL
Albumin: 3.5 g/dL (ref 3.5–5.0)
Anion gap: 16 — ABNORMAL HIGH (ref 5–15)
BUN: 33 mg/dL — ABNORMAL HIGH (ref 6–20)
CO2: 24 mmol/L (ref 22–32)
Calcium: 9.3 mg/dL (ref 8.9–10.3)
Chloride: 93 mmol/L — ABNORMAL LOW (ref 98–111)
Creatinine, Ser: 5.72 mg/dL — ABNORMAL HIGH (ref 0.61–1.24)
GFR, Estimated: 12 mL/min — ABNORMAL LOW (ref >60)
Glucose, Bld: 205 mg/dL — ABNORMAL HIGH (ref 70–99)
Phosphorus: 6.5 mg/dL — ABNORMAL HIGH (ref 2.5–4.6)
Potassium: 4.3 mmol/L (ref 3.5–5.1)
Sodium: 133 mmol/L — ABNORMAL LOW (ref 135–145)

## 2023-10-25 LAB — GLUCOSE, CAPILLARY
Glucose-Capillary: 135 mg/dL — ABNORMAL HIGH (ref 70–99)
Glucose-Capillary: 173 mg/dL — ABNORMAL HIGH (ref 70–99)
Glucose-Capillary: 143 mg/dL — ABNORMAL HIGH (ref 70–99)
Glucose-Capillary: 125 mg/dL — ABNORMAL HIGH (ref 70–99)
Glucose-Capillary: 201 mg/dL — ABNORMAL HIGH (ref 70–99)

## 2023-10-25 NOTE — Progress Notes (Signed)
PROGRESS NOTE    [name]  VWU:[id number] DOB: [birth year] DOA: 09/09/2023 PCP: [name], NP   Brief Narrative:  43 y.o. male with medical history significant of ESRD on hemodialysis (not fully compliant), insulin-dependent type 1 diabetes, gastroparesis, Left AKA, admitted to [facility] on 07/09/23 for DKA, Sepsis and hospital course significant for MV AND TV  MRSA endocarditis, followed by acute MCA stroke , left occipital and bilateral cerebellar strokes consistent with septic emboli, brain abscess from MRSA, left IJ DVT was transferred to [facility] for MV repair on 11/29.  While at [facility] he underwent cardiac cath showing non obstructive disease, craniotomy with abscess drainage on 09/01/23,. He had subarachnoid hemorrhage while on IV heparin, right third toe distal necrosis,was seen by podiatry, but family wanted conservative management. Since his tertiary care needs have been completed, he was transferred back to [facility] for further management.  PTOT  recommending SNF and awaiting placement.   Assessment & Plan:    MRSA TV/MV endocarditis: 10/12, blood cultures positive for MRSA. 10/13, TTE showed LVEF of 25-30% with concern for AV/MV abnormality  10/16 and 10/22, TEE showed MV/TV vegetations.  Currently on IV vancomycin.  Per ID recommendation: complete 8 weeks of treatment EOT 1/29. Recommend outpatient follow up with ID.    Hypotension: Resolved.  Blood pressure intermittently on the higher side. DC'd midodrine on 10/21/2023.     ESRD on HD:  Nephrology on board.  Continue hemodialysis as per nephrology.   Acute CVA /acute metabolic encephalopathy: Mri brain on 10/17 showed large right MCA infarct.  11/25, MRA head showed significant for large necrotic appearing lesion within the right frontal lobe within area of the prior right MCA territory infarct with vasogenic edema and 8 mm leftward midline shift.  Neurology recommendations for Eliquis.  PT/OT recommendations for  SNF. Repeat MRI brain done for increased confusion. Showed decreased size of area of abnormal diffusion restriction within the posterior right frontal operculum compatible with reported abscess. The amount of surrounding edema has also decreased -Neurology has already signed off: Outpatient follow-up with neurology.   Cerebral emboli with abscess/ Cerebritis -Secondary to to MRSA Endocarditis.  -S/p craniotomy and abscess drainage.    Type 1 DM uncontrolled with hyperglycemia and hypoglycemia: -Continue long-acting insulin.  Continue CBGs with SSI.  Carb modified diet.   Acute left upper extremity DVT: -Continue Eliquis.  -Left upper extremity venous duplex showed age-indeterminate DVT involving the left axillary vein.   Anemia of chronic kidney disease: -Hemoglobin currently stable.  Monitor intermittently   Right third toe necrosis: H/o prior left BKA: Physical deconditioning Per documentation, patient/family opted for conservative management and declined surgical management. Patient was seen by PT/OT with recommendation for SNF on discharge.   Prostate fluid collection: Urology evaluated patient at [facility], suggested that its a dilated seminal vesicle and recommended for no surgical intervention.    Mild cognitive dysfunction with behavioral changes.  Currently stable on seroquel.   Obesity -Outpatient follow-up   DVT prophylaxis: Eliquis Code Status: Full Family Communication: None at bedside Disposition Plan: Status is: Inpatient Remains inpatient appropriate because: Of severity of illness.  Need for SNF placement.  Currently medically stable for discharge    Consultants: Nephrology/psychiatry/ID  Procedures: As above  Antimicrobials:  Anti-infectives (From admission, onward)    Start     Dose/Rate Route Frequency Ordered Stop   10/21/23 1230  vancomycin (VANCOCIN) 750 mg in sodium chloride 0.9 % 250 mL IVPB  Status:  Discontinued  750 mg 265 mL/hr  over 60 Minutes Intravenous Every T-Th-Sa (Hemodialysis) 10/21/23 0804 10/21/23 0811   10/21/23 1200  vancomycin (VANCOCIN) 750 mg in sodium chloride 0.9 % 250 mL IVPB        750 mg 250 mL/hr over 60 Minutes Intravenous Every T-Th-Sa (Hemodialysis) 10/21/23 0811 10/28/23 1159   10/16/23 1345  vancomycin (VANCOCIN) 750 mg in sodium chloride 0.9 % 250 mL IVPB  Status:  Discontinued        750 mg 265 mL/hr over 60 Minutes Intravenous Every T-Th-Sa (Hemodialysis) 10/16/23 1257 10/21/23 0804   10/16/23 1200  vancomycin (VANCOREADY) IVPB 750 mg/150 mL  Status:  Discontinued        750 mg 150 mL/hr over 60 Minutes Intravenous Every T-Th-Sa (Hemodialysis) 10/14/23 1843 10/16/23 1257   10/14/23 1845  vancomycin (VANCOREADY) IVPB 500 mg/100 mL        500 mg 100 mL/hr over 60 Minutes Intravenous  Once 10/14/23 1843 10/15/23 0806   10/14/23 1200  vancomycin (VANCOREADY) IVPB 500 mg/100 mL  Status:  Discontinued        500 mg 100 mL/hr over 60 Minutes Intravenous Every T-Th-Sa (Hemodialysis) 10/13/23 1309 10/14/23 1843   10/13/23 1200  vancomycin (VANCOREADY) IVPB 500 mg/100 mL  Status:  Discontinued        500 mg 100 mL/hr over 60 Minutes Intravenous Every M-W-F (Hemodialysis) 10/11/23 1517 10/13/23 1309   10/12/23 1200  vancomycin (VANCOREADY) IVPB 500 mg/100 mL  Status:  Discontinued        500 mg 100 mL/hr over 60 Minutes Intravenous Every T-Th-Sa (Hemodialysis) 10/11/23 1514 10/11/23 1517   09/24/23 1200  vancomycin (VANCOREADY) IVPB 500 mg/100 mL  Status:  Discontinued        500 mg 100 mL/hr over 60 Minutes Intravenous Every M-W-F (Hemodialysis) 09/21/23 1246 10/11/23 1514   09/20/23 1200  vancomycin (VANCOCIN) 750 mg in sodium chloride 0.9 % 250 mL IVPB  Status:  Discontinued        750 mg 265 mL/hr over 60 Minutes Intravenous Every M-W-F (Hemodialysis) 09/20/23 0446 09/21/23 1246   09/20/23 1045  vancomycin (VANCOCIN) 750 mg in sodium chloride 0.9 % 250 mL IVPB        750 mg 265 mL/hr over  60 Minutes Intravenous  Once 09/20/23 0959 09/20/23 1226   09/20/23 0945  vancomycin (VANCOREADY) IVPB 750 mg/150 mL  Status:  Discontinued        750 mg 150 mL/hr over 60 Minutes Intravenous  Once 09/20/23 0855 09/20/23 0959   09/10/23 1200  vancomycin (VANCOREADY) IVPB 750 mg/150 mL  Status:  Discontinued        750 mg 150 mL/hr over 60 Minutes Intravenous Every M-W-F (Hemodialysis) 09/09/23 2030 09/20/23 0446   09/09/23 2200  linezolid (ZYVOX) tablet 600 mg  Status:  Discontinued        600 mg Oral Every 12 hours 09/09/23 2035 09/10/23 0946        Subjective: Patient seen and examined at bedside.  No fever, vomiting, seizures or agitation reported objective: Vitals:   10/24/23 0757 10/24/23 1707 10/24/23 2030 10/25/23 0524  BP: (!) 108/97 (!) 125/112 120/87 (!) 119/90  Pulse: (!) 121 99 (!) 114 (!) 113  Resp:   18   Temp:   98.7 F (37.1 C)   TempSrc:   Oral   SpO2: 96% 100% 99% 96%  Weight:      Height:        Intake/Output Summary (Last 24 hours)  at 10/25/2023 0724 Last data filed at 10/24/2023 1843 Gross per 24 hour  Intake 480 ml  Output 0 ml  Net 480 ml   Filed Weights   10/19/23 1229 10/19/23 1626 10/21/23 1744  Weight: 95.1 kg 96.3 kg 95 kg    Examination:  General: On room air currently.  No distress.  Still slow to respond.  Poor historian.  Chronically ill and deconditioned looking. respiratory: Decreased breath sounds at bases bilaterally with some crackles  CVS: S1 and S2 heard; remains intermittently tachycardic abdominal: Soft, nontender, remains slightly distended; no organomegaly; normal bowel sounds heard  extremities: Right lower extremity edema present; has left BKA  Data Reviewed: I have personally reviewed following labs and imaging studies  CBC: Recent Labs  Lab 10/19/23 1035  WBC 5.8  HGB 10.1*  HCT 33.6*  MCV 92.1  PLT 179   Basic Metabolic Panel: Recent Labs  Lab 10/19/23 1035 10/19/23 1740 10/25/23 0417  NA 137 137 133*  K  5.2* 4.5 4.3  CL 94* 95* 93*  CO2 24 25 24   GLUCOSE 75 143* 205*  BUN 56* 29* 33*  CREATININE 7.67* 4.33* 5.72*  CALCIUM 9.7 9.3 9.3  PHOS 7.9*  --  6.5*   GFR: Estimated Creatinine Clearance: 18.1 mL/min (A) (by C-G formula based on SCr of 5.72 mg/dL (H)). Liver Function Tests: Recent Labs  Lab 10/19/23 1035 10/19/23 1740 10/25/23 0417  AST  --  19  20  --   ALT  --  8  10  --   ALKPHOS  --  150*  150*  --   BILITOT  --  1.3*  1.3*  --   PROT  --  7.8  7.7  --   ALBUMIN 3.6 3.8  3.9 3.5   No results for input(s): "LIPASE", "AMYLASE" in the last 168 hours. No results for input(s): "AMMONIA" in the last 168 hours. Coagulation Profile: No results for input(s): "INR", "PROTIME" in the last 168 hours. Cardiac Enzymes: No results for input(s): "CKTOTAL", "CKMB", "CKMBINDEX", "TROPONINI" in the last 168 hours. BNP (last 3 results) No results for input(s): "PROBNP" in the last 8760 hours. HbA1C: No results for input(s): "HGBA1C" in the last 72 hours. CBG: Recent Labs  Lab 10/23/23 2024 10/24/23 0755 10/24/23 1150 10/24/23 1705 10/24/23 2028  GLUCAP 108* 116* 169* 151* 195*   Lipid Profile: No results for input(s): "CHOL", "HDL", "LDLCALC", "TRIG", "CHOLHDL", "LDLDIRECT" in the last 72 hours. Thyroid Function Tests: No results for input(s): "TSH", "T4TOTAL", "FREET4", "T3FREE", "THYROIDAB" in the last 72 hours. Anemia Panel: No results for input(s): "VITAMINB12", "FOLATE", "FERRITIN", "TIBC", "IRON", "RETICCTPCT" in the last 72 hours. Sepsis Labs: No results for input(s): "PROCALCITON", "LATICACIDVEN" in the last 168 hours.  No results found for this or any previous visit (from the past 240 hours).       Radiology Studies: No results found.      Scheduled Meds:  (feeding supplement) PROSource Plus  30 mL Oral BID BM   apixaban  5 mg Oral BID   bacitracin   Topical Daily   Chlorhexidine Gluconate Cloth  6 each Topical Q0600   darbepoetin (ARANESP)  injection - DIALYSIS  100 mcg Subcutaneous Q Sun-1800   dextrose  1 Tube Oral Once   gabapentin  100 mg Oral TID   Gerhardt's butt cream   Topical BID   influenza vac split trivalent PF  0.5 mL Intramuscular Tomorrow-1000   insulin aspart  0-5 Units Subcutaneous QHS  insulin aspart  0-6 Units Subcutaneous TID WC   insulin glargine-yfgn  6 Units Subcutaneous Daily   loratadine  10 mg Oral Daily   midodrine  10 mg Oral Q T,Th,Sa-HD   pantoprazole  40 mg Oral Daily   povidone-Iodine   Topical Q0600   QUEtiapine  12.5 mg Oral q morning   QUEtiapine  25 mg Oral QHS   senna-docusate  2 tablet Oral BID   sevelamer carbonate  2,400 mg Oral TID WC   sodium zirconium cyclosilicate  10 g Oral TID   Continuous Infusions:  albumin human     vancomycin (VANCOCIN) 750 mg in sodium chloride 0.9 % 250 mL IVPB Stopped (10/23/23 1824)          Glade Lloyd, MD [facility] 10/25/2023, 7:24 AM

## 2023-10-25 NOTE — Progress Notes (Signed)
Subjective: Seen in room, no complaints eating breakfast  Objective Vital signs in last 24 hours: Vitals:   10/24/23 1707 10/24/23 2030 10/25/23 0524 10/25/23 0750  BP: (!) 125/112 120/87 (!) 119/90 (!) 127/99  Pulse: 99 (!) 114 (!) 113 (!) 109  Resp:  18  18  Temp:  98.7 F (37.1 C)  98.1 F (36.7 C)  TempSrc:  Oral  Oral  SpO2: 100% 99% 96% 99%  Weight:      Height:       Weight change:   Physical Exam: General: Alert male in NAD Heart: RRR, no murmurs, rubs or gallops Lungs: CTA bilaterally, respirations unlabored on RA Abdomen: Soft, +BS Extremities: Trace pedal edema , 1+ edema b/l upper extremities Dialysis Access: L femoral TDC   OP dialysis Orders: MWF - [facility] -> may be will be going to [place] TTS on discharge 4hr, 400/A1.5, EDW 77kg, L femoral TDC, 2K/3Ca bath, no heparin  Problem/Plan: 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 Continues on extended Vanc course - will be on Vanc IV q HD until 10/27/23. S/p transfer to [facility] and drainage 08/31/24. Repeat brain MRI 10/17/23 showed improvement.  Acute B CVA/septic emboli- due to above Acute LUE DVT: On Eliquis ESRD: Transitioned to TTS, next HD Tuesday HTN/volume: BP stable, CT 10/09/23 with diffuse anasarca. Significantly above EDW. Max UF with HD. Continue to try to enforce fluid restrictions/compliance with full HD time Anemia of ESRD: Hgb 10.1, Aranesp to 150 mcg q Sunday while here. Last 1/19.  No IV iron while on abx. Secondary HPTH: CorrCa/Phos high. Continue to hold calcitriol. Sevelamer increased to 3/meals.   8. Hyperkalemia:  K 4.3 ,Persistently high despite Lokelma 10g TID, using 1K bath intermittently with HD.   9. Nutrition: Alb low, continue supplements.  10. T1DM: per primary 11. Dispo: SNF being arranged - not finalized yet   Shane Pastel, PA-C [facility] Beeper [phone] 10/25/2023,10:14 AM  LOS: 46 days   Labs: Basic Metabolic Panel: Recent Labs  Lab 10/19/23 1035 10/19/23 1740 10/25/23 0417  NA 137 137 133*  K 5.2* 4.5 4.3  CL 94* 95* 93*  CO2 24 25 24   GLUCOSE 75 143* 205*  BUN 56* 29* 33*  CREATININE 7.67* 4.33* 5.72*  CALCIUM 9.7 9.3 9.3  PHOS 7.9*  --  6.5*   Liver Function Tests: Recent Labs  Lab 10/19/23 1035 10/19/23 1740 10/25/23 0417  AST  --  19  20  --   ALT  --  8  10  --   ALKPHOS  --  150*  150*  --   BILITOT  --  1.3*  1.3*  --   PROT  --  7.8  7.7  --   ALBUMIN 3.6 3.8  3.9 3.5   No results for input(s): "LIPASE", "AMYLASE" in the last 168 hours. No results for input(s): "AMMONIA" in the last 168 hours. CBC: Recent Labs  Lab 10/19/23 1035  WBC 5.8  HGB 10.1*  HCT 33.6*  MCV 92.1  PLT 179   Cardiac Enzymes: No results for input(s): "CKTOTAL", "CKMB", "CKMBINDEX", "TROPONINI" in the last 168 hours. CBG: Recent Labs  Lab 10/24/23 0755 10/24/23 1150 10/24/23 1705 10/24/23 2028 10/25/23 0748  GLUCAP 116* 169* 151* 195* 135*    Studies/Results: No results found. Medications:  albumin human     vancomycin (VANCOCIN)  750 mg in sodium chloride 0.9 % 250 mL IVPB Stopped (10/23/23 1824)    (feeding supplement) PROSource Plus  30 mL Oral BID BM   apixaban  5 mg Oral BID   bacitracin   Topical Daily   Chlorhexidine Gluconate Cloth  6 each Topical Q0600   darbepoetin (ARANESP) injection - DIALYSIS  100 mcg Subcutaneous Q Sun-1800   dextrose  1 Tube Oral Once   gabapentin  100 mg Oral TID   Gerhardt's butt cream   Topical BID   influenza vac split trivalent PF  0.5 mL Intramuscular Tomorrow-1000   insulin aspart  0-5 Units Subcutaneous QHS   insulin aspart  0-6 Units Subcutaneous TID WC   insulin glargine-yfgn  6 Units Subcutaneous Daily   loratadine  10 mg Oral Daily   midodrine  10 mg Oral Q T,Th,Sa-HD   pantoprazole  40 mg Oral Daily   povidone-Iodine   Topical  Q0600   QUEtiapine  12.5 mg Oral q morning   QUEtiapine  25 mg Oral QHS   senna-docusate  2 tablet Oral BID   sevelamer carbonate  2,400 mg Oral TID WC   sodium zirconium cyclosilicate  10 g Oral TID

## 2023-10-25 NOTE — Progress Notes (Signed)
Mobility Specialist Progress Note:    10/25/23 1000  Mobility  Activity Turned to back - supine;Turned to right side  Level of Assistance Minimal assist, patient does 75% or more  Assistive Device Other (Comment) (Bedrails + bed pad)  Range of Motion/Exercises Active  LLE Weight Bearing Per Provider Order NWB  Activity Response Tolerated well  Mobility Referral Yes  Mobility visit 1 Mobility  Mobility Specialist Start Time (ACUTE ONLY) [id number]  Mobility Specialist Stop Time (ACUTE ONLY) 1008  Mobility Specialist Time Calculation (min) (ACUTE ONLY) 16 min   Pt received in bed and agreeable. Worked on RUE/RLE flexion/extension. C/o soreness on his bottom. Able to assist in pulling himself higher in bed w/ RUE and bed rails. Pt left in bed with call bell and all needs met. RN/NT aware.  D'Vante Earlene Plater Mobility Specialist Please contact via Special educational needs teacher or Rehab office at [phone]

## 2023-10-25 NOTE — Plan of Care (Signed)
Problem: Health Behavior/Discharge Planning: Goal: Ability to manage health-related needs will improve Outcome: Progressing   Problem: Clinical Measurements: Goal: Ability to maintain clinical measurements within normal limits will improve Outcome: Progressing Goal: Will remain free from infection Outcome: Progressing Goal: Diagnostic test results will improve Outcome: Progressing Goal: Cardiovascular complication will be avoided Outcome: Progressing   Problem: Activity: Goal: Risk for activity intolerance will decrease Outcome: Progressing   Problem: Coping: Goal: Level of anxiety will decrease Outcome: Progressing   Problem: Skin Integrity: Goal: Risk for impaired skin integrity will decrease Outcome: Progressing   Problem: Education: Goal: Ability to describe self-care measures that may prevent or decrease complications (Diabetes Survival Skills Education) will improve Outcome: Progressing Goal: Individualized Educational Video(s) Outcome: Progressing   Problem: Coping: Goal: Ability to adjust to condition or change in health will improve Outcome: Progressing   Problem: Fluid Volume: Goal: Ability to maintain a balanced intake and output will improve Outcome: Progressing   Problem: Health Behavior/Discharge Planning: Goal: Ability to identify and utilize available resources and services will improve Outcome: Progressing Goal: Ability to manage health-related needs will improve Outcome: Progressing   Problem: Metabolic: Goal: Ability to maintain appropriate glucose levels will improve Outcome: Progressing   Problem: Skin Integrity: Goal: Risk for impaired skin integrity will decrease Outcome: Progressing   Problem: Tissue Perfusion: Goal: Adequacy of tissue perfusion will improve Outcome: Progressing   Problem: Education: Goal: Knowledge of disease and its progression will improve Outcome: Progressing Goal: Individualized Educational Video(s) Outcome:  Progressing   Problem: Fluid Volume: Goal: Compliance with measures to maintain balanced fluid volume will improve Outcome: Progressing   Problem: Health Behavior/Discharge Planning: Goal: Ability to manage health-related needs will improve Outcome: Progressing   Problem: Nutritional: Goal: Ability to make healthy dietary choices will improve Outcome: Progressing   Problem: Clinical Measurements: Goal: Complications related to the disease process, condition or treatment will be avoided or minimized Outcome: Progressing

## 2023-10-25 NOTE — Progress Notes (Signed)
Advised this morning that [facility] is still at capacity. Pt was denied late Friday by [facility]. Spoke to [employer] with [facility] out-pt HD this am. Progress notes from the weekend faxed to Kim this morning in order for pt's referral to be reviewed by [facility]. Update provided to CSW. Will assist as needed.   Olivia Canter Renal Navigator [phone]

## 2023-10-26 LAB — RENAL FUNCTION PANEL
Albumin: 3.3 g/dL — ABNORMAL LOW (ref 3.5–5.0)
Anion gap: 15 (ref 5–15)
BUN: 42 mg/dL — ABNORMAL HIGH (ref 6–20)
CO2: 29 mmol/L (ref 22–32)
Calcium: 9.3 mg/dL (ref 8.9–10.3)
Chloride: 97 mmol/L — ABNORMAL LOW (ref 98–111)
Creatinine, Ser: 7.43 mg/dL — ABNORMAL HIGH (ref 0.61–1.24)
GFR, Estimated: 9 mL/min — ABNORMAL LOW (ref >60)
Glucose, Bld: 147 mg/dL — ABNORMAL HIGH (ref 70–99)
Phosphorus: 7.5 mg/dL — ABNORMAL HIGH (ref 2.5–4.6)
Potassium: 5.2 mmol/L — ABNORMAL HIGH (ref 3.5–5.1)
Sodium: 137 mmol/L (ref 135–145)

## 2023-10-26 LAB — CBC
HCT: 28.9 % — ABNORMAL LOW (ref 39.0–52.0)
Hemoglobin: 8.4 g/dL — ABNORMAL LOW (ref 13.0–17.0)
MCH: 27.5 pg (ref 26.0–34.0)
MCHC: 29.1 g/dL — ABNORMAL LOW (ref 30.0–36.0)
MCV: 94.4 fL (ref 80.0–100.0)
Platelets: 163 10*3/uL (ref 150–400)
RBC: 3.06 MIL/uL — ABNORMAL LOW (ref 4.22–5.81)
RDW: 16 % — ABNORMAL HIGH (ref 11.5–15.5)
WBC: 5 10*3/uL (ref 4.0–10.5)
nRBC: 0 % (ref 0.0–0.2)

## 2023-10-26 LAB — GLUCOSE, CAPILLARY
Glucose-Capillary: 115 mg/dL — ABNORMAL HIGH (ref 70–99)
Glucose-Capillary: 120 mg/dL — ABNORMAL HIGH (ref 70–99)
Glucose-Capillary: 230 mg/dL — ABNORMAL HIGH (ref 70–99)

## 2023-10-26 MED ORDER — HYDROMORPHONE HCL 1 MG/ML IJ SOLN
0.5000 mg | Freq: Four times a day (QID) | INTRAMUSCULAR | Status: DC | PRN
  Administered 2023-10-26 – 2023-10-27 (×3): 0.5 mg via INTRAVENOUS
  Filled 2023-10-26 (×4): qty 0.5

## 2023-10-26 MED ORDER — HEPARIN SODIUM (PORCINE) 1000 UNIT/ML DIALYSIS
1000.0000 [IU] | INTRAMUSCULAR | Status: DC | PRN
  Administered 2023-10-26: 5000 [IU]

## 2023-10-26 MED ORDER — HEPARIN SODIUM (PORCINE) 1000 UNIT/ML IJ SOLN
INTRAMUSCULAR | Status: AC
  Filled 2023-10-26: qty 5

## 2023-10-26 NOTE — Progress Notes (Signed)
PROGRESS NOTE    [name]  KGM:[id number] DOB: 1980-11-17 DOA: 09/09/2023 PCP: [name], NP   Brief Narrative:  43 y.o. male with medical history significant of ESRD on hemodialysis (not fully compliant), insulin-dependent type 1 diabetes, gastroparesis, Left AKA, admitted to [facility] on 07/09/23 for DKA, Sepsis and hospital course significant for MV AND TV  MRSA endocarditis, followed by acute MCA stroke , left occipital and bilateral cerebellar strokes consistent with septic emboli, brain abscess from MRSA, left IJ DVT was transferred to [facility] for MV repair on 11/29.  While at [facility] he underwent cardiac cath showing non obstructive disease, craniotomy with abscess drainage on 09/01/23,. He had subarachnoid hemorrhage while on IV heparin, right third toe distal necrosis,was seen by podiatry, but family wanted conservative management. Since his tertiary care needs have been completed, he was transferred back to [facility] for further management.  PTOT  recommending SNF and awaiting placement.   Assessment & Plan:    MRSA TV/MV endocarditis: 10/12, blood cultures positive for MRSA. 10/13, TTE showed LVEF of 25-30% with concern for AV/MV abnormality  10/16 and 10/22, TEE showed MV/TV vegetations.  Currently on IV vancomycin.  Per ID recommendation: complete 8 weeks of treatment EOT 1/29. Recommend outpatient follow up with ID.    Hypotension: Resolved.  Blood pressure intermittently on the higher side. DC'd midodrine on 10/21/2023.     ESRD on HD:  Nephrology on board.  Continue hemodialysis as per nephrology.   Acute CVA /acute metabolic encephalopathy: Mri brain on 10/17 showed large right MCA infarct.  11/25, MRA head showed significant for large necrotic appearing lesion within the right frontal lobe within area of the prior right MCA territory infarct with vasogenic edema and 8 mm leftward midline shift.  Neurology recommendations for Eliquis.  PT/OT recommendations for  SNF. Repeat MRI brain done for increased confusion. Showed decreased size of area of abnormal diffusion restriction within the posterior right frontal operculum compatible with reported abscess. The amount of surrounding edema has also decreased -Neurology has already signed off: Outpatient follow-up with neurology.   Cerebral emboli with abscess/ Cerebritis -Secondary to to MRSA Endocarditis.  -S/p craniotomy and abscess drainage.    Type 1 DM uncontrolled with hyperglycemia and hypoglycemia: -Continue long-acting insulin.  Continue CBGs with SSI.  Carb modified diet.   Acute left upper extremity DVT: -Continue Eliquis.  -Left upper extremity venous duplex showed age-indeterminate DVT involving the left axillary vein.   Anemia of chronic kidney disease: -Hemoglobin currently stable.  Monitor intermittently   Right third toe necrosis: H/o prior left BKA: Physical deconditioning Per documentation, patient/family opted for conservative management and declined surgical management. Patient was seen by PT/OT with recommendation for SNF on discharge.   Prostate fluid collection: Urology evaluated patient at [facility], suggested that its a dilated seminal vesicle and recommended for no surgical intervention.    Mild cognitive dysfunction with behavioral changes.  Currently stable on seroquel.   Obesity -Outpatient follow-up   DVT prophylaxis: Eliquis Code Status: Full Family Communication: None at bedside Disposition Plan: Status is: Inpatient Remains inpatient appropriate because: Of severity of illness.  Need for SNF placement.  Currently medically stable for discharge    Consultants: Nephrology/psychiatry/ID  Procedures: As above  Antimicrobials:  Anti-infectives (From admission, onward)    Start     Dose/Rate Route Frequency Ordered Stop   10/21/23 1230  vancomycin (VANCOCIN) 750 mg in sodium chloride 0.9 % 250 mL IVPB  Status:  Discontinued  750 mg 265 mL/hr  over 60 Minutes Intravenous Every T-Th-Sa (Hemodialysis) 10/21/23 0804 10/21/23 0811   10/21/23 1200  vancomycin (VANCOCIN) 750 mg in sodium chloride 0.9 % 250 mL IVPB        750 mg 250 mL/hr over 60 Minutes Intravenous Every T-Th-Sa (Hemodialysis) 10/21/23 0811 10/28/23 1159   10/16/23 1345  vancomycin (VANCOCIN) 750 mg in sodium chloride 0.9 % 250 mL IVPB  Status:  Discontinued        750 mg 265 mL/hr over 60 Minutes Intravenous Every T-Th-Sa (Hemodialysis) 10/16/23 1257 10/21/23 0804   10/16/23 1200  vancomycin (VANCOREADY) IVPB 750 mg/150 mL  Status:  Discontinued        750 mg 150 mL/hr over 60 Minutes Intravenous Every T-Th-Sa (Hemodialysis) 10/14/23 1843 10/16/23 1257   10/14/23 1845  vancomycin (VANCOREADY) IVPB 500 mg/100 mL        500 mg 100 mL/hr over 60 Minutes Intravenous  Once 10/14/23 1843 10/15/23 0806   10/14/23 1200  vancomycin (VANCOREADY) IVPB 500 mg/100 mL  Status:  Discontinued        500 mg 100 mL/hr over 60 Minutes Intravenous Every T-Th-Sa (Hemodialysis) 10/13/23 1309 10/14/23 1843   10/13/23 1200  vancomycin (VANCOREADY) IVPB 500 mg/100 mL  Status:  Discontinued        500 mg 100 mL/hr over 60 Minutes Intravenous Every M-W-F (Hemodialysis) 10/11/23 1517 10/13/23 1309   10/12/23 1200  vancomycin (VANCOREADY) IVPB 500 mg/100 mL  Status:  Discontinued        500 mg 100 mL/hr over 60 Minutes Intravenous Every T-Th-Sa (Hemodialysis) 10/11/23 1514 10/11/23 1517   09/24/23 1200  vancomycin (VANCOREADY) IVPB 500 mg/100 mL  Status:  Discontinued        500 mg 100 mL/hr over 60 Minutes Intravenous Every M-W-F (Hemodialysis) 09/21/23 1246 10/11/23 1514   09/20/23 1200  vancomycin (VANCOCIN) 750 mg in sodium chloride 0.9 % 250 mL IVPB  Status:  Discontinued        750 mg 265 mL/hr over 60 Minutes Intravenous Every M-W-F (Hemodialysis) 09/20/23 0446 09/21/23 1246   09/20/23 1045  vancomycin (VANCOCIN) 750 mg in sodium chloride 0.9 % 250 mL IVPB        750 mg 265 mL/hr over  60 Minutes Intravenous  Once 09/20/23 0959 09/20/23 1226   09/20/23 0945  vancomycin (VANCOREADY) IVPB 750 mg/150 mL  Status:  Discontinued        750 mg 150 mL/hr over 60 Minutes Intravenous  Once 09/20/23 0855 09/20/23 0959   09/10/23 1200  vancomycin (VANCOREADY) IVPB 750 mg/150 mL  Status:  Discontinued        750 mg 150 mL/hr over 60 Minutes Intravenous Every M-W-F (Hemodialysis) 09/09/23 2030 09/20/23 0446   09/09/23 2200  linezolid (ZYVOX) tablet 600 mg  Status:  Discontinued        600 mg Oral Every 12 hours 09/09/23 2035 09/10/23 0946        Subjective: Patient seen and examined at bedside.  Poor historian.  No agitation, seizures, fever or vomiting reported  objective: Vitals:   10/25/23 0524 10/25/23 0750 10/25/23 2021 10/26/23 0416  BP: (!) 119/90 (!) 127/99 129/63 138/88  Pulse: (!) 113 (!) 109 (!) 110 100  Resp:  18 17 18   Temp:  98.1 F (36.7 C) 98.3 F (36.8 C) 98.9 F (37.2 C)  TempSrc:  Oral Oral Oral  SpO2: 96% 99% 100% 99%  Weight:      Height:  Intake/Output Summary (Last 24 hours) at 10/26/2023 0727 Last data filed at 10/26/2023 0416 Gross per 24 hour  Intake 720 ml  Output 0 ml  Net 720 ml   Filed Weights   10/19/23 1229 10/19/23 1626 10/21/23 1744  Weight: 95.1 kg 96.3 kg 95 kg    Examination:  General: No acute distress.  Remains on room air.  Still slow to respond.  Poor historian.  Chronically ill and deconditioned looking.  Flat affect.  Poor historian respiratory: Bilateral decreased breath sounds at bases with scattered crackles CVS: Mild intermittent tachycardia present; S1 and S2 are heard  abdominal: Soft, nontender, distended mildly; no organomegaly; bowel sounds normally heard  extremities: Left BKA present; trace right lower extremity edema present  Data Reviewed: I have personally reviewed following labs and imaging studies  CBC: Recent Labs  Lab 10/19/23 1035  WBC 5.8  HGB 10.1*  HCT 33.6*  MCV 92.1  PLT 179    Basic Metabolic Panel: Recent Labs  Lab 10/19/23 1035 10/19/23 1740 10/25/23 0417  NA 137 137 133*  K 5.2* 4.5 4.3  CL 94* 95* 93*  CO2 24 25 24   GLUCOSE 75 143* 205*  BUN 56* 29* 33*  CREATININE 7.67* 4.33* 5.72*  CALCIUM 9.7 9.3 9.3  PHOS 7.9*  --  6.5*   GFR: Estimated Creatinine Clearance: 18.1 mL/min (A) (by C-G formula based on SCr of 5.72 mg/dL (H)). Liver Function Tests: Recent Labs  Lab 10/19/23 1035 10/19/23 1740 10/25/23 0417  AST  --  19  20  --   ALT  --  8  10  --   ALKPHOS  --  150*  150*  --   BILITOT  --  1.3*  1.3*  --   PROT  --  7.8  7.7  --   ALBUMIN 3.6 3.8  3.9 3.5   No results for input(s): "LIPASE", "AMYLASE" in the last 168 hours. No results for input(s): "AMMONIA" in the last 168 hours. Coagulation Profile: No results for input(s): "INR", "PROTIME" in the last 168 hours. Cardiac Enzymes: No results for input(s): "CKTOTAL", "CKMB", "CKMBINDEX", "TROPONINI" in the last 168 hours. BNP (last 3 results) No results for input(s): "PROBNP" in the last 8760 hours. HbA1C: No results for input(s): "HGBA1C" in the last 72 hours. CBG: Recent Labs  Lab 10/25/23 0748 10/25/23 1134 10/25/23 1614 10/25/23 2019 10/25/23 2224  GLUCAP 135* 173* 143* 125* 201*   Lipid Profile: No results for input(s): "CHOL", "HDL", "LDLCALC", "TRIG", "CHOLHDL", "LDLDIRECT" in the last 72 hours. Thyroid Function Tests: No results for input(s): "TSH", "T4TOTAL", "FREET4", "T3FREE", "THYROIDAB" in the last 72 hours. Anemia Panel: No results for input(s): "VITAMINB12", "FOLATE", "FERRITIN", "TIBC", "IRON", "RETICCTPCT" in the last 72 hours. Sepsis Labs: No results for input(s): "PROCALCITON", "LATICACIDVEN" in the last 168 hours.  No results found for this or any previous visit (from the past 240 hours).       Radiology Studies: No results found.      Scheduled Meds:  (feeding supplement) PROSource Plus  30 mL Oral BID BM   apixaban  5 mg Oral  BID   bacitracin   Topical Daily   Chlorhexidine Gluconate Cloth  6 each Topical Q0600   darbepoetin (ARANESP) injection - DIALYSIS  100 mcg Subcutaneous Q Sun-1800   dextrose  1 Tube Oral Once   gabapentin  100 mg Oral TID   Gerhardt's butt cream   Topical BID   influenza vac split trivalent PF  0.5  mL Intramuscular Tomorrow-1000   insulin aspart  0-5 Units Subcutaneous QHS   insulin aspart  0-6 Units Subcutaneous TID WC   insulin glargine-yfgn  6 Units Subcutaneous Daily   loratadine  10 mg Oral Daily   midodrine  10 mg Oral Q T,Th,Sa-HD   pantoprazole  40 mg Oral Daily   povidone-Iodine   Topical Q0600   QUEtiapine  12.5 mg Oral q morning   QUEtiapine  25 mg Oral QHS   senna-docusate  2 tablet Oral BID   sevelamer carbonate  2,400 mg Oral TID WC   sodium zirconium cyclosilicate  10 g Oral TID   Continuous Infusions:  albumin human     vancomycin (VANCOCIN) 750 mg in sodium chloride 0.9 % 250 mL IVPB Stopped (10/23/23 1824)          Glade Lloyd, MD [facility] 10/26/2023, 7:27 AM

## 2023-10-26 NOTE — Progress Notes (Signed)
Subjective: Resting in bed, states tired being in hospital only complaint,/ for HD today on schedule, multiple fluid cups on bedside tray /stressed volume restrictions again  Objective Vital signs in last 24 hours: Vitals:   10/25/23 0750 10/25/23 2021 10/26/23 0416 10/26/23 0736  BP: (!) 127/99 129/63 138/88 (!) 125/107  Pulse: (!) 109 (!) 110 100 (!) 120  Resp: 18 17 18 18   Temp: 98.1 F (36.7 C) 98.3 F (36.8 C) 98.9 F (37.2 C) 98.4 F (36.9 C)  TempSrc: Oral Oral Oral Oral  SpO2: 99% 100% 99% 98%  Weight:      Height:       Weight change:   Physical Exam: General: Alert male in NAD, facial edema Heart: RRR, no murmurs, rubs or gallops Lungs: CTA bilaterally, respirations unlabored on RA Abdomen: Soft, +BS Extremities: Trace pedal edema , 1+ edema b/l upper extremities Dialysis Access: L femoral TDC    OP dialysis Orders: MWF - [facility] -> may be will be going to [place] TTS on discharge 4hr, 400/A1.5, EDW 77kg, L femoral TDC, 2K/3Ca bath, no heparin   Problem/Plan: 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 Continues on extended Vanc course - will be on Vanc IV q HD until 10/27/23. S/p transfer to [facility] and drainage 08/31/24. Repeat brain MRI 10/17/23 showed improvement.  Acute B CVA/septic emboli- due to above Acute LUE DVT: On Eliquis ESRD: Transitioned to TTS, next HD Tuesday HTN/volume: BP stable, CT 10/09/23 with diffuse anasarca. Significantly above EDW. Max UF with HD. Continue to try to enforce fluid restrictions/compliance with full HD time Anemia of ESRD: Hgb 10.1, Aranesp to 150 mcg q Sunday while here. Last 1/19.  No IV iron while on abx. Secondary HPTH: CorrCa/Phos high. Continue to hold calcitriol. Sevelamer increased to 3/meals.  8. Hyperkalemia:  K 4.3 ,Persistently high despite Lokelma 10g TID, using 1K bath intermittently with  HD.   9. Nutrition: Alb low, continue supplements.  10. T1DM: per primary 11. Dispo: SNF being arranged - not finalized yet   Lenny Pastel, PA-C [facility] Beeper [phone] 10/26/2023,10:43 AM  LOS: 47 days   Labs: Basic Metabolic Panel: Recent Labs  Lab 10/19/23 1740 10/25/23 0417  NA 137 133*  K 4.5 4.3  CL 95* 93*  CO2 25 24  GLUCOSE 143* 205*  BUN 29* 33*  CREATININE 4.33* 5.72*  CALCIUM 9.3 9.3  PHOS  --  6.5*   Liver Function Tests: Recent Labs  Lab 10/19/23 1740 10/25/23 0417  AST 19  20  --   ALT 8  10  --   ALKPHOS 150*  150*  --   BILITOT 1.3*  1.3*  --   PROT 7.8  7.7  --   ALBUMIN 3.8  3.9 3.5   No results for input(s): "LIPASE", "AMYLASE" in the last 168 hours. No results for input(s): "AMMONIA" in the last 168 hours. CBC: No results for input(s): "WBC", "NEUTROABS", "HGB", "HCT", "MCV", "PLT" in the last 168 hours. Cardiac Enzymes: No results for input(s): "CKTOTAL", "CKMB", "CKMBINDEX", "TROPONINI" in the last 168 hours. CBG: Recent Labs  Lab 10/25/23 1134 10/25/23 1614 10/25/23 2019 10/25/23 2224 10/26/23 0734  GLUCAP 173* 143* 125* 201* 115*    Studies/Results: No results found. Medications:  albumin human     vancomycin (VANCOCIN) 750 mg in sodium chloride 0.9 % 250 mL IVPB Stopped (  10/23/23 1824)    (feeding supplement) PROSource Plus  30 mL Oral BID BM   apixaban  5 mg Oral BID   bacitracin   Topical Daily   Chlorhexidine Gluconate Cloth  6 each Topical Q0600   darbepoetin (ARANESP) injection - DIALYSIS  100 mcg Subcutaneous Q Sun-1800   dextrose  1 Tube Oral Once   gabapentin  100 mg Oral TID   Gerhardt's butt cream   Topical BID   influenza vac split trivalent PF  0.5 mL Intramuscular Tomorrow-1000   insulin aspart  0-5 Units Subcutaneous QHS   insulin aspart  0-6 Units Subcutaneous TID WC   insulin glargine-yfgn  6 Units Subcutaneous Daily   loratadine  10 mg Oral Daily   midodrine  10 mg Oral Q  T,Th,Sa-HD   pantoprazole  40 mg Oral Daily   povidone-Iodine   Topical Q0600   QUEtiapine  12.5 mg Oral q morning   QUEtiapine  25 mg Oral QHS   senna-docusate  2 tablet Oral BID   sevelamer carbonate  2,400 mg Oral TID WC   sodium zirconium cyclosilicate  10 g Oral TID

## 2023-10-26 NOTE — Progress Notes (Signed)
Occupational Therapy Treatment Patient Details Name: [name] MRN: [id number] DOB: [birth year] Today's Date: 10/26/2023   History of present illness 43 y.o. male presents to [facility] 09/09/23 as a transfer from [facility] for further management of endocarditis. Pt originally was admitted to [facility] 07/09/23 for DKA and sepsis after being found on the ground. 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 10/17 pt had a R MCA, L occipital, and B cerebellum CVA w/ septic emboli. ICU admit 10/18 w/ intubation 10/18-10/24. Pt was transferred to [facility] for MV repair on 11/29. [facility], pt had cardiac cath w/ non obstructive disease, SAH, a craniotomy w/ abscess drainage on 12/4, and R third toe distal necrosis. PMH: chronic L AKA, MRSA, IDDM, ESRD on HD MWF, HTN, TEE positive for vegetation on multiple valves, cellulitis, medical noncompliance, narcotic dependence, DM with gastroparesis, L internal jugular DVT.   OT comments  Pt could benefit from air mattress overlay to help with transfers during therapy and prevent skin break down. Pt could benefit from carter block splint for L UE elevation and OR contacted to acquire a block later today. Pt could benefit from resting hand splint after the reduction of edema in L UE. Pt currently demonstrates cognitive deficits and benefits from 1 step commands. Pt completed bed mobility, EOB sitting, lateral transfer to drop arm chair and back to bed as precursor to HD pending. Recommendation for skilled inpatient follow up therapy, <3 hours/day.  Pt goes by [name] Goals updated this session       If plan is discharge home, recommend the following:  Two people to help with walking and/or transfers;Two people to help with bathing/dressing/bathroom   Equipment Recommendations  Wheelchair (measurements OT);Wheelchair cushion (measurements OT);Hospital bed;Hoyer lift    Recommendations for Other Services Speech consult;PT consult     Precautions / Restrictions Precautions Precautions: Fall;Other (comment) Precaution Comments: L AKA, L hemiparesis and edema, L neglect, bowel incontinence. L femoral HD cath Restrictions Weight Bearing Restrictions Per Provider Order: No LLE Weight Bearing Per Provider Order: Non weight bearing Other Position/Activity Restrictions: Pt has sling for LUE when mobilizing OOB; LLE prosthetic not fitting       Mobility Bed Mobility Overal bed mobility: Needs Assistance Bed Mobility: Rolling, Supine to Sit, Sit to Supine Rolling: +2 for physical assistance, Max assist   Supine to sit: +2 for physical assistance, Max assist Sit to supine: +2 for physical assistance, Max assist   General bed mobility comments: pt hooking R UE elbow to roll toward L side with counting to help with sequence and initiation. pt rolls toward L > R. Pt rolling toward R side requires positioning of R UE prior and (A) to count and initiate momentum. Pt requesting helicopter method to come to sitting. pt hooking R UE elbows and pulling trunk forward as second therapist uses pad to pivot hips toward EOB. pt static sitting with L lateral lean and requires RUE on rail to static sit CGA with occassion (A) up to mod to realign to midline. pt returning to bed requires increased time and pt asking for [facility] to be increased despite education the bed lower coudl be benefical. pt using R UE on bed rail and total +2 max (A) to pivot onto bed surface. pt tilted to pt can push with R LE and R UE on bed rail pulling to slide toward HOB total +2 mod (A) with pad.    Transfers Overall transfer level: Needs assistance  Transfers: Bed to chair/wheelchair/BSC            Lateral/Scoot Transfers: +2 physical assistance, Max assist General transfer comment: pt with pad in place and max cues to position head and help weight shift for transfer to chair. pt with pad slide to chair sitting in static chair. pt with increased (A) needed to  transfer to bed from chair due to cognitive deficits to seqence and transfer toward L hemiplegic side     Balance Overall balance assessment: Needs assistance Sitting-balance support: Single extremity supported, Feet supported Sitting balance-[name]: Poor   Postural control: Left lateral lean                                 ADL either performed or assessed with clinical judgement   ADL Overall ADL's : Needs assistance/impaired Eating/Feeding: Set up;Bed level   Grooming: Set up;Bed level Grooming Details (indicate cue type and reason): pt using baby wipes on face chest thighs peri area and hips Upper Body Bathing: Minimal assistance;Bed level   Lower Body Bathing: Moderate assistance;Bed level Lower Body Bathing Details (indicate cue type and reason): pt terminated hygiene quickly with baby wipes so needs staff present to help facilitate attention to details Upper Body Dressing : Moderate assistance;Bed level Upper Body Dressing Details (indicate cue type and reason): don gown dressing L UE first with pt holding LUE with R UE. pt requires therapist to thread gown up shoulder. pt able to place R UE and pull up. Lower Body Dressing: Maximal assistance;Bed level Lower Body Dressing Details (indicate cue type and reason): pt lifting L LE to have therapist don sock       Toileting - Clothing Manipulation Details (indicate cue type and reason): pt calling out for hygiene on arrival. pt upset stating that RN and CNA are suppose to do hygiene. pt begins to initiate his personal care. pt doff his brief and placed it in the floor. pt using baby wipes to wash peri area. Pt rolling with R LE in bed rail to bridge for buttock and try to reach with R UE. pt asking staff to check skin hyigene. pt rolling R and L to correctly don brief.       General ADL Comments: pt agreeable to oob to get sheets changed. pt educated on the benefits of an air mattress. pt states  "my mother said i  need one of thoses. I dont like it. i slide"    Extremity/Trunk Assessment Upper Extremity Assessment Upper Extremity Assessment: Right hand dominant LUE Deficits / Details: edema present from shoulder to hand. arm elevated on pillow. pt need (A) to reach and sustain grasp on L UE due to edema and body habitus. pt with clawing positioning. Pt could benefit from resting hand splint LUE Sensation: decreased light touch;decreased proprioception LUE Coordination: decreased fine motor;decreased gross motor   Lower Extremity Assessment Lower Extremity Assessment: Defer to PT evaluation        Vision   Vision Assessment?: Vision impaired- to be further tested in functional context   Perception     Praxis      Cognition Arousal: Alert Behavior During Therapy: Flat affect Overall Cognitive Status: Impaired/Different from baseline Area of Impairment: Attention, Memory, Following commands                   Current Attention Level: Selective Memory: Decreased short-term memory Following Commands: Follows one step commands  inconsistently Safety/Judgement: Decreased awareness of deficits Awareness: Intellectual Problem Solving: Slow processing, Difficulty sequencing General Comments: pt benefits from single step commands and shuts down with dual task challenges. pt states "wait give me a minute when presented with dual task" Pt benefits from long pauses between commands to allow increased time to process and initiate        Exercises      Shoulder Instructions       General Comments requesting air mattress, carter block splint for L UE elevation, PT to call about prosthetic fit    Pertinent Vitals/ Pain       Pain Assessment Pain Assessment: Faces Faces Pain Scale: Hurts even more Pain Location: buttock and wounds Pain Descriptors / Indicators: Sore, Discomfort Pain Intervention(s): Limited activity within patient's tolerance, Monitored during session, Premedicated  before session, Repositioned  Home Living                                          Prior Functioning/Environment              Frequency  Min 1X/week        Progress Toward Goals  OT Goals(current goals can now be found in the care plan section)  Progress towards OT goals: Goals updated  Acute Rehab OT Goals Patient Stated Goal: to get to rehab and start therapy there OT Goal Formulation: With patient Time For Goal Achievement: 11/09/23 Potential to Achieve Goals: Good ADL Goals Pt Will Perform Grooming: with modified independence;sitting (chair level) Pt Will Perform Lower Body Bathing: with min assist;bed level Pt Will Perform Lower Body Dressing: with mod assist;bed level;with adaptive equipment Pt Will Transfer to Toilet: with min assist (bed level change of brief) Pt/caregiver will Perform Home Exercise Program: Left upper extremity;Independently (place in carter block splint for elevation) Additional ADL Goal #1: pt will demonstrate following 2 step command 50% of attempts Additional ADL Goal #2: pt will demonstrate bed Rolling transfer with hooking R UE 100% of attempts Additional ADL Goal #3: Pt will static sit EOB CGA for 3 minutes  Plan      Co-evaluation    PT/OT/SLP Co-Evaluation/Treatment: Yes Reason for Co-Treatment: For patient/therapist safety;Necessary to address cognition/behavior during functional activity;To address functional/ADL transfers   OT goals addressed during session: ADL's and self-care;Proper use of Adaptive equipment and DME;Strengthening/ROM      AM-PAC OT "6 Clicks" Daily Activity     Outcome Measure   Help from another person eating meals?: A Little Help from another person taking care of personal grooming?: A Little Help from another person toileting, which includes using toliet, bedpan, or urinal?: A Little Help from another person bathing (including washing, rinsing, drying)?: A Lot Help from another person  to put on and taking off regular upper body clothing?: A Little Help from another person to put on and taking off regular lower body clothing?: A Lot 6 Click Score: 16    End of Session    OT Visit Diagnosis: Other abnormalities of gait and mobility (R26.89);Other symptoms and signs involving the nervous system (R29.898);Hemiplegia and hemiparesis;Muscle weakness (generalized) (M62.81);Other symptoms and signs involving cognitive function Hemiplegia - Right/Left: Left Hemiplegia - dominant/non-dominant: Non-Dominant Hemiplegia - caused by: Cerebral infarction   Activity Tolerance Patient tolerated treatment well   Patient Left in bed;with bed alarm set;with call bell/phone within reach   Nurse Communication Mobility status;Precautions;Need for lift  equipment        Time: 1000-1049 OT Time Calculation (min): 49 min  Charges: OT General Charges $OT Visit: 1 Visit OT Treatments $Self Care/Home Management : 8-22 mins   Brynn, OTR/L  Acute Rehabilitation Services Office: [phone] .   Mateo Flow 10/26/2023, 11:21 AM

## 2023-10-26 NOTE — Progress Notes (Signed)
Received patient in bed to unit.  Alert and oriented.  Informed consent signed and in chart.   TX duration:  Patient tolerated well.  Transported back to the room  Alert, without acute distress.  Hand-off given to patient's nurse.   Access used: TDC/L fem Access issues: tx lines in 'reverse' for tx duration due to sluggish aspiration of arterial lumen  Total UF removed: 2.5 Medication(s) given: post HD tx TDC heparin block   10/26/23 1613  Vitals  Temp 98.1 F (36.7 C)  Temp Source Oral  BP 130/67  BP Location Right Leg  BP Method Automatic  Patient Position (if appropriate) Lying  Pulse Rate (!) 122  Pulse Rate Source Monitor  Resp (!) 24  Oxygen Therapy  SpO2 100 %  O2 Device Nasal Cannula  O2 Flow Rate (L/min) 2 L/min  During Treatment Monitoring  Blood Flow Rate (mL/min) 400 mL/min  Arterial Pressure (mmHg) -243.82 mmHg  Venous Pressure (mmHg) 232.92 mmHg  TMP (mmHg) 17.77 mmHg  Ultrafiltration Rate (mL/min) 828 mL/min  Dialysate Flow Rate (mL/min) 300 ml/min  Dialysate Potassium Concentration 2  Dialysate Calcium Concentration 2.5  Duration of HD Treatment -hour(s) 4 hour(s)  Cumulative Fluid Removed (mL) per Treatment  2500.2  HD Safety Checks Performed Yes  Intra-Hemodialysis Comments Tx completed  Dialysis Fluid Bolus Normal Saline  Bolus Amount (mL) 300 mL      Freddi Starr, RN Kidney Dialysis Unit

## 2023-10-26 NOTE — Progress Notes (Addendum)
RN reach out to Dr because pt refuses any PO pain med and only wants IV. States that is what he has been getting and will not take PO despite c/o pain. Pt told RN to give him his morning med and come back after breakfast to admininister dilaudid. RN educated pt that that is not the standard of care. Pt frustrated and continually states that this RN came in and disrupting his routine. DR notified.   C/o pain after therapy: provider ordered dilaudid for breakthrough for PT/OT.

## 2023-10-26 NOTE — Progress Notes (Signed)
Contacted Kim with [facility] out-pt HD. Pt's referral for [facility] is still pending. Will assist as needed.   Olivia Canter Renal Navigator [phone]

## 2023-10-26 NOTE — Progress Notes (Signed)
Physical Therapy Treatment Patient Details Name: [name] MRN: [id number] DOB: 26-Feb-1981 Today's Date: 10/26/2023   History of Present Illness 43 y.o. male presents to [facility] 09/09/23 as a transfer from [facility] for further management of endocarditis. Pt originally was admitted to [facility] 07/09/23 for DKA and sepsis after being found on the ground. 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 10/17 pt had a R MCA, L occipital, and B cerebellum CVA w/ septic emboli. ICU admit 10/18 w/ intubation 10/18-10/24. Pt was transferred to [facility] for MV repair on 11/29. At [facility], pt had cardiac cath w/ non obstructive disease, SAH, a craniotomy w/ abscess drainage on 12/4, and R third toe distal necrosis. PMH: chronic L AKA, MRSA, IDDM, ESRD on HD MWF, HTN, TEE positive for vegetation on multiple valves, cellulitis, medical noncompliance, narcotic dependence, DM with gastroparesis, L internal jugular DVT.    PT Comments  Continuing work on functional mobility and activity tolerance; [name] can take a while to warm up to his caregivers, and that was the case with today's physical therapy and occupational therapy co-session; after attempts to elicit functional goals that he would like to work on, and giving him time to point out his wants (whether related to functional goals or not),  he did participate in two functional transfers -- bed to chair, and then chair to bed, via lateral scooting; both transfers requiring +2 assist;  [name]'s trunk asymmetries and stiffness make weight shifting bilaterally and anteriorly onto his right foot difficult; this is affecting his transfers, and there remains a lot of work to be done;   During the session, [name] indicated he would like to work on helping his hemiplegic left side to move better; Obtained a carter block for UE upright positioning to help with edema (will deliver to his room tomorrow); Pt is a [name],  this PT called to confirm; will reach out to his prosthetist to see about taking a look at his prosthesis fit;   Goals updated    If plan is discharge home, recommend the following: Two people to help with walking and/or transfers;Assist for transportation;Supervision due to cognitive status;Help with stairs or ramp for entrance;Two people to help with bathing/dressing/bathroom;Assistance with cooking/housework;Direct supervision/assist for financial management;Direct supervision/assist for medications management   Can travel by private vehicle     No  Equipment Recommendations  Hoyer lift;Wheelchair (measurements PT);Wheelchair cushion (measurements PT);Other (comment)    Recommendations for Other Services       Precautions / Restrictions Precautions Precautions: Fall;Other (comment) Precaution Comments: L AKA, L hemiparesis and edema, L neglect, bowel incontinence. L femoral HD cath Restrictions Weight Bearing Restrictions Per Provider Order: No LLE Weight Bearing Per Provider Order: Non weight bearing Other Position/Activity Restrictions: Pt has sling for LUE when mobilizing OOB; LLE prosthetic not fitting     Mobility  Bed Mobility Overal bed mobility: Needs Assistance Bed Mobility: Rolling, Supine to Sit, Sit to Supine Rolling: +2 for physical assistance, Max assist   Supine to sit: +2 for physical assistance, Max assist Sit to supine: +2 for physical assistance, Max assist   General bed mobility comments: pt hooking R UE elbow to roll toward L side with counting to help with sequence and initiation. pt rolls toward L > R. Pt rolling toward R side requires positioning of R UE prior and (A) to count and initiate momentum. Pt requesting helicopter method to come to sitting. pt hooking R UE elbows and  pulling trunk forward as second therapist uses pad to pivot hips toward EOB. pt static sitting with L lateral lean and requires RUE on rail to static sit CGA with occassion (A) up to  mod to realign to midline. pt returning to bed requires increased time and pt asking for [facility] to be increased despite education the bed lower coudl be benefical. pt using R UE on bed rail and total +2 max (A) to pivot onto bed surface. pt tilted to pt can push with R LE and R UE on bed rail pulling to slide toward HOB total +2 mod (A) with pad.    Transfers Overall transfer level: Needs assistance   Transfers: Bed to chair/wheelchair/BSC            Lateral/Scoot Transfers: +2 physical assistance, Max assist General transfer comment: pt with pad in place and max cues to position head and help weight shift for transfer to chair. pt with pad slide to chair sitting in static chair. pt with increased (A) needed to transfer to bed from chair due to cognitive deficits to seqence and transfer toward L hemiplegic side    Ambulation/Gait                   Stairs             Wheelchair Mobility     Tilt Bed    Modified Rankin (Stroke Patients Only)       Balance Overall balance assessment: Needs assistance Sitting-balance support: Single extremity supported, Feet supported Sitting balance-[name]: Poor   Postural control: Left lateral lean                                  Cognition Arousal: Alert Behavior During Therapy: Flat affect Overall Cognitive Status: Impaired/Different from baseline Area of Impairment: Attention, Memory, Following commands                   Current Attention Level: Selective Memory: Decreased short-term memory Following Commands: Follows one step commands inconsistently Safety/Judgement: Decreased awareness of deficits Awareness: Intellectual Problem Solving: Slow processing, Difficulty sequencing General Comments: pt benefits from single step commands and shuts down with dual task challenges. pt states "wait give me a minute when presented with dual task" Pt benefits from long pauses between commands to allow  increased time to process and initiate        Exercises      General Comments General comments (skin integrity, edema, etc.): Plan to call Prosthetist to prosthesis fit check      Pertinent Vitals/Pain Pain Assessment Pain Assessment: Faces Faces Pain Scale: Hurts even more Pain Location: buttock and wounds Pain Descriptors / Indicators: Sore, Discomfort Pain Intervention(s): Monitored during session    Home Living                          Prior Function            PT Goals (current goals can now be found in the care plan section) Acute Rehab PT Goals PT Goal Formulation: With patient Time For Goal Achievement: 11/09/23 Potential to Achieve Goals: Fair Progress towards PT goals: Goals updated    Frequency    Min 1X/week      PT Plan      Co-evaluation   Reason for Co-Treatment: For patient/therapist safety;Necessary to address cognition/behavior during functional activity;To address functional/ADL  transfers   OT goals addressed during session: ADL's and self-care;Proper use of Adaptive equipment and DME;Strengthening/ROM      AM-PAC PT "6 Clicks" Mobility   Outcome Measure  Help needed turning from your back to your side while in a flat bed without using bedrails?: A Lot Help needed moving from lying on your back to sitting on the side of a flat bed without using bedrails?: Total Help needed moving to and from a bed to a chair (including a wheelchair)?: Total Help needed standing up from a chair using your arms (e.g., wheelchair or bedside chair)?: Total Help needed to walk in hospital room?: Total Help needed climbing 3-5 steps with a railing? : Total 6 Click Score: 7    End of Session Equipment Utilized During Treatment: Other (comment) (bed pad) Activity Tolerance: Patient tolerated treatment well Patient left: in bed;with call bell/phone within reach;with bed alarm set Nurse Communication: Mobility status;Need for lift equipment PT  Visit Diagnosis: Other abnormalities of gait and mobility (R26.89);Other symptoms and signs involving the nervous system (R29.898);Hemiplegia and hemiparesis Hemiplegia - Right/Left: Left Hemiplegia - dominant/non-dominant: Non-dominant Hemiplegia - caused by: Cerebral infarction     Time: 1001-1049 PT Time Calculation (min) (ACUTE ONLY): 48 min  Charges:    $Therapeutic Activity: 23-37 mins PT General Charges $$ ACUTE PT VISIT: 1 Visit                     Van Clines, PT  Acute Rehabilitation Services Office [phone] Secure Chat welcomed    [name] 10/26/2023, 3:22 PM

## 2023-10-26 NOTE — TOC Progression Note (Signed)
Transition of Care [facility]) - Progression Note    Patient Details  Name: [name] MRN: [id number] Date of Birth: 01/24/1981  [facility] CM/SW Contact  Carley Hammed, LCSW Phone Number: 10/26/2023, 3:13 PM  Clinical Narrative:    CSW confirmed that [facility] still offering a bed. HD referral to [facility] still pending. Pt will need auth after HD is confirmed. TOC will continue to follow.    Expected Discharge Plan: Skilled Nursing Facility Barriers to Discharge: Continued Medical Work up  Expected Discharge Plan and Services                                               Social Determinants of Health (SDOH) Interventions SDOH Screenings   Food Insecurity: No Food Insecurity (09/09/2023)  Housing: Unknown (09/09/2023)  Transportation Needs: No Transportation Needs (09/09/2023)  Utilities: Not At Risk (09/09/2023)  Alcohol Screen: Low Risk  (02/15/2023)  Depression (PHQ2-9): Low Risk  (02/15/2023)  Financial Resource Strain: Low Risk  (02/15/2023)  Physical Activity: Inactive (02/15/2023)  Social Connections: Socially Integrated (02/15/2023)  Stress: No Stress Concern Present (02/15/2023)  Tobacco Use: Low Risk  (09/12/2023)  Recent Concern: Tobacco Use - Medium Risk (09/01/2023)   Received from [facility]    Readmission Risk Interventions     No data to display

## 2023-10-27 LAB — GLUCOSE, CAPILLARY
Glucose-Capillary: 201 mg/dL — ABNORMAL HIGH (ref 70–99)
Glucose-Capillary: 229 mg/dL — ABNORMAL HIGH (ref 70–99)
Glucose-Capillary: 161 mg/dL — ABNORMAL HIGH (ref 70–99)
Glucose-Capillary: 214 mg/dL — ABNORMAL HIGH (ref 70–99)

## 2023-10-27 MED ORDER — CHLORHEXIDINE GLUCONATE CLOTH 2 % EX PADS: 6.0000 | MEDICATED_PAD | Freq: Every day | CUTANEOUS | Status: DC

## 2023-10-27 MED ORDER — HYDROMORPHONE HCL 1 MG/ML IJ SOLN
0.5000 mg | INTRAMUSCULAR | Status: DC | PRN
  Administered 2023-10-27 – 2023-11-24 (×134): 0.5 mg via INTRAVENOUS
  Filled 2023-10-27 (×138): qty 0.5

## 2023-10-27 NOTE — Progress Notes (Addendum)
Contacted Kim with [facility] out-pt HD. Advised that pt's referral is still pending for [facility]. Clinic is awaiting MD review. Will assist as needed.   Olivia Canter Renal Navigator [phone]  Addendum at 2:24 pm: Pt has been denied by [facility]. Navigator requested that [facility] review pt's referral to see if they would be an option since snf would transport there too. Update provided to CSW. Will await determination from [facility].

## 2023-10-27 NOTE — Progress Notes (Signed)
Mobility Specialist Progress Note:    10/27/23 1000  Mobility  Activity Turned to back - supine;Moved into chair position in bed;Turned to left side  Level of Assistance Minimal assist, patient does 75% or more  Assistive Device Other (Comment) (Foam block to hold up LUE)  Range of Motion/Exercises Active;Active Assistive;Passive;Left arm  LLE Weight Bearing Per Provider Order NWB  Activity Response Tolerated well  Mobility Referral Yes  Mobility visit 1 Mobility  Mobility Specialist Start Time (ACUTE ONLY) 0827  Mobility Specialist Stop Time (ACUTE ONLY) [id number]  Mobility Specialist Time Calculation (min) (ACUTE ONLY) 25 min   Pt received in bed and agreeable. Alongside PT [facility]), introduced pt to foam block w/ purpose to elevate LUE and assist w/ fluid and edema. MS worked on active assistive/passive LUE ROM also. No complaints throughout. Pt left in bed with call bell and all needs met. RN aware.  [name] Mobility Specialist Please contact via Special educational needs teacher or Rehab office at [phone]

## 2023-10-27 NOTE — Progress Notes (Signed)
PROGRESS NOTE    [name]  EAV:[id number] DOB: 1981-07-25 DOA: 09/09/2023 PCP: [name], NP   Brief Narrative:  43 y.o. 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  While at [facility] he underwent cardiac cath showing non obstructive disease, craniotomy with abscess drainage on 09/01/23,. He had subarachnoid hemorrhage while on IV heparin, right third toe distal necrosis,was seen by podiatry, but family wanted conservative management. Since his tertiary care needs have been completed, he was transferred back to [facility] for further management.  PTOT  recommending SNF and awaiting placement.  He still needs outpatient hemodialysis set up which may take several more days.  Assessment & Plan:    MRSA TV/MV endocarditis: 10/12, blood cultures positive for MRSA. 10/13, TTE showed LVEF of 25-30% with concern for AV/MV abnormality  10/16 and 10/22, TEE showed MV/TV vegetations.  Currently on IV vancomycin.  Per ID recommendation: complete 8 weeks of treatment EOT 1/29. No longer on antibiotics.  Recommend outpatient follow-up with ID.   Hypotension: Resolved.  Blood pressure intermittently on the higher side. DC'd midodrine on 10/21/2023.     ESRD on HD:  Nephrology on board.  Continue hemodialysis as per nephrology.   Acute CVA /acute metabolic encephalopathy: Mri brain on 10/17 showed large right MCA infarct.  11/25, MRA head showed significant for large necrotic appearing lesion within the right frontal lobe within area of the prior right MCA territory infarct with vasogenic edema and  8 mm leftward midline shift.  Neurology recommendations for Eliquis.  PT/OT recommendations for SNF. Repeat MRI brain done for increased confusion. Showed decreased size of area of abnormal diffusion restriction within the posterior right frontal operculum compatible with reported abscess. The amount of surrounding edema has also decreased -Neurology has already signed off: Outpatient follow-up with neurology.   Cerebral emboli with abscess/ Cerebritis -Secondary to to MRSA Endocarditis.  -S/p craniotomy and abscess drainage.    Type 1 DM uncontrolled with hyperglycemia and hypoglycemia: -Continue long-acting insulin.  Continue CBGs with SSI.  Carb modified diet.   Acute left upper extremity DVT: -Continue Eliquis.  -Left upper extremity venous duplex showed age-indeterminate DVT involving the left axillary vein.   Anemia of chronic kidney disease: -Hemoglobin currently stable.  Monitor intermittently   Right third toe necrosis: H/o prior left BKA: Physical deconditioning Per documentation, patient/family opted for conservative management and declined surgical management. Patient was seen by PT/OT with recommendation for SNF on discharge.   Prostate fluid collection: Urology evaluated patient at [facility], suggested that its a dilated seminal vesicle and recommended for no surgical intervention.    Mild cognitive dysfunction with behavioral changes.  Currently stable on seroquel.   Obesity -Outpatient follow-up   DVT prophylaxis: Eliquis Code Status: Full Family Communication: None at bedside Disposition Plan: Status is: Inpatient Remains inpatient appropriate because: Of severity of illness.  Need for SNF placement.  Currently medically stable for discharge    Consultants: Nephrology/psychiatry/ID  Procedures: As above  Antimicrobials:  Anti-infectives (From admission, onward)    Start     Dose/Rate Route Frequency Ordered Stop   10/21/23 1230  vancomycin  (VANCOCIN) 750  mg in sodium chloride 0.9 % 250 mL IVPB  Status:  Discontinued        750 mg 265 mL/hr over 60 Minutes Intravenous Every T-Th-Sa (Hemodialysis) 10/21/23 0804 10/21/23 0811   10/21/23 1200  vancomycin (VANCOCIN) 750 mg in sodium chloride 0.9 % 250 mL IVPB        750 mg 250 mL/hr over 60 Minutes Intravenous Every T-Th-Sa (Hemodialysis) 10/21/23 0811 10/28/23 1159   10/16/23 1345  vancomycin (VANCOCIN) 750 mg in sodium chloride 0.9 % 250 mL IVPB  Status:  Discontinued        750 mg 265 mL/hr over 60 Minutes Intravenous Every T-Th-Sa (Hemodialysis) 10/16/23 1257 10/21/23 0804   10/16/23 1200  vancomycin (VANCOREADY) IVPB 750 mg/150 mL  Status:  Discontinued        750 mg 150 mL/hr over 60 Minutes Intravenous Every T-Th-Sa (Hemodialysis) 10/14/23 1843 10/16/23 1257   10/14/23 1845  vancomycin (VANCOREADY) IVPB 500 mg/100 mL        500 mg 100 mL/hr over 60 Minutes Intravenous  Once 10/14/23 1843 10/15/23 0806   10/14/23 1200  vancomycin (VANCOREADY) IVPB 500 mg/100 mL  Status:  Discontinued        500 mg 100 mL/hr over 60 Minutes Intravenous Every T-Th-Sa (Hemodialysis) 10/13/23 1309 10/14/23 1843   10/13/23 1200  vancomycin (VANCOREADY) IVPB 500 mg/100 mL  Status:  Discontinued        500 mg 100 mL/hr over 60 Minutes Intravenous Every M-W-F (Hemodialysis) 10/11/23 1517 10/13/23 1309   10/12/23 1200  vancomycin (VANCOREADY) IVPB 500 mg/100 mL  Status:  Discontinued        500 mg 100 mL/hr over 60 Minutes Intravenous Every T-Th-Sa (Hemodialysis) 10/11/23 1514 10/11/23 1517   09/24/23 1200  vancomycin (VANCOREADY) IVPB 500 mg/100 mL  Status:  Discontinued        500 mg 100 mL/hr over 60 Minutes Intravenous Every M-W-F (Hemodialysis) 09/21/23 1246 10/11/23 1514   09/20/23 1200  vancomycin (VANCOCIN) 750 mg in sodium chloride 0.9 % 250 mL IVPB  Status:  Discontinued        750 mg 265 mL/hr over 60 Minutes Intravenous Every M-W-F (Hemodialysis) 09/20/23 0446 09/21/23 1246   09/20/23  1045  vancomycin (VANCOCIN) 750 mg in sodium chloride 0.9 % 250 mL IVPB        750 mg 265 mL/hr over 60 Minutes Intravenous  Once 09/20/23 0959 09/20/23 1226   09/20/23 0945  vancomycin (VANCOREADY) IVPB 750 mg/150 mL  Status:  Discontinued        750 mg 150 mL/hr over 60 Minutes Intravenous  Once 09/20/23 0855 09/20/23 0959   09/10/23 1200  vancomycin (VANCOREADY) IVPB 750 mg/150 mL  Status:  Discontinued        750 mg 150 mL/hr over 60 Minutes Intravenous Every M-W-F (Hemodialysis) 09/09/23 2030 09/20/23 0446   09/09/23 2200  linezolid (ZYVOX) tablet 600 mg  Status:  Discontinued        600 mg Oral Every 12 hours 09/09/23 2035 09/10/23 0946        Subjective: Patient seen and examined at bedside.  Poor historian.  No acute overnight events noted.  He is complaining of ongoing pain in between Dilaudid administrations. objective: Vitals:   10/27/23 0458 10/27/23 0510 10/27/23 0600 10/27/23 0601  BP: (!) 79/40 111/77 (!) 90/47 103/65  Pulse: (!) 110 (!) 111 (!) 113 (!) 113  Resp: 18 18 18 18   Temp: 98.4 F (36.9 C) 98.4 F (36.9 C)  98.4 F (36.9 C) 98.4 F (36.9 C)  TempSrc:      SpO2: 90% 98% 98% 92%  Weight:      Height:        Intake/Output Summary (Last 24 hours) at 10/27/2023 0946 Last data filed at 10/27/2023 1610 Gross per 24 hour  Intake 360 ml  Output 2500 ml  Net -2140 ml   Filed Weights   10/21/23 1744 10/26/23 1148 10/26/23 1626  Weight: 95 kg 96.1 kg 94.9 kg    Examination:  General: No acute distress.  Remains on room air.  Still slow to respond.  Poor historian.  Chronically ill and deconditioned looking.  Flat affect.  Poor historian respiratory: Bilateral decreased breath sounds at bases with scattered crackles CVS: Mild intermittent tachycardia present; S1 and S2 are heard  abdominal: Soft, nontender, distended mildly; no organomegaly; bowel sounds normally heard  extremities: Left BKA present; trace right lower extremity edema present  Data  Reviewed: I have personally reviewed following labs and imaging studies  CBC: Recent Labs  Lab 10/26/23 1100  WBC 5.0  HGB 8.4*  HCT 28.9*  MCV 94.4  PLT 163   Basic Metabolic Panel: Recent Labs  Lab 10/25/23 0417 10/26/23 1100  NA 133* 137  K 4.3 5.2*  CL 93* 97*  CO2 24 29  GLUCOSE 205* 147*  BUN 33* 42*  CREATININE 5.72* 7.43*  CALCIUM 9.3 9.3  PHOS 6.5* 7.5*   GFR: Estimated Creatinine Clearance: 13.9 mL/min (A) (by C-G formula based on SCr of 7.43 mg/dL (H)). Liver Function Tests: Recent Labs  Lab 10/25/23 0417 10/26/23 1100  ALBUMIN 3.5 3.3*   No results for input(s): "LIPASE", "AMYLASE" in the last 168 hours. No results for input(s): "AMMONIA" in the last 168 hours. Coagulation Profile: No results for input(s): "INR", "PROTIME" in the last 168 hours. Cardiac Enzymes: No results for input(s): "CKTOTAL", "CKMB", "CKMBINDEX", "TROPONINI" in the last 168 hours. BNP (last 3 results) No results for input(s): "PROBNP" in the last 8760 hours. HbA1C: No results for input(s): "HGBA1C" in the last 72 hours. CBG: Recent Labs  Lab 10/25/23 2224 10/26/23 0734 10/26/23 1701 10/26/23 2057 10/27/23 0740  GLUCAP 201* 115* 120* 230* 201*   Lipid Profile: No results for input(s): "CHOL", "HDL", "LDLCALC", "TRIG", "CHOLHDL", "LDLDIRECT" in the last 72 hours. Thyroid Function Tests: No results for input(s): "TSH", "T4TOTAL", "FREET4", "T3FREE", "THYROIDAB" in the last 72 hours. Anemia Panel: No results for input(s): "VITAMINB12", "FOLATE", "FERRITIN", "TIBC", "IRON", "RETICCTPCT" in the last 72 hours. Sepsis Labs: No results for input(s): "PROCALCITON", "LATICACIDVEN" in the last 168 hours.  No results found for this or any previous visit (from the past 240 hours).       Radiology Studies: No results found.      Scheduled Meds:  (feeding supplement) PROSource Plus  30 mL Oral BID BM   apixaban  5 mg Oral BID   bacitracin   Topical Daily    Chlorhexidine Gluconate Cloth  6 each Topical Q0600   darbepoetin (ARANESP) injection - DIALYSIS  100 mcg Subcutaneous Q Sun-1800   dextrose  1 Tube Oral Once   gabapentin  100 mg Oral TID   Gerhardt's butt cream   Topical BID   influenza vac split trivalent PF  0.5 mL Intramuscular Tomorrow-1000   insulin aspart  0-5 Units Subcutaneous QHS   insulin aspart  0-6 Units Subcutaneous TID WC   insulin glargine-yfgn  6 Units Subcutaneous Daily   loratadine  10 mg Oral  Daily   midodrine  10 mg Oral Q T,Th,Sa-HD   pantoprazole  40 mg Oral Daily   povidone-Iodine   Topical Q0600   QUEtiapine  12.5 mg Oral q morning   QUEtiapine  25 mg Oral QHS   senna-docusate  2 tablet Oral BID   sevelamer carbonate  2,400 mg Oral TID WC   sodium zirconium cyclosilicate  10 g Oral TID   Continuous Infusions:  albumin human     vancomycin (VANCOCIN) 750 mg in sodium chloride 0.9 % 250 mL IVPB Stopped (10/23/23 1824)        Total care time: 55 minutes.  Malvina Schadler D Sherryll Burger, DO [facility] 10/27/2023, 9:46 AM

## 2023-10-27 NOTE — Plan of Care (Signed)
Problem: Health Behavior/Discharge Planning: Goal: Ability to manage health-related needs will improve Outcome: Progressing   Problem: Clinical Measurements: Goal: Ability to maintain clinical measurements within normal limits will improve Outcome: Progressing Goal: Will remain free from infection Outcome: Progressing Goal: Diagnostic test results will improve Outcome: Progressing Goal: Cardiovascular complication will be avoided Outcome: Progressing   Problem: Activity: Goal: Risk for activity intolerance will decrease Outcome: Progressing   Problem: Coping: Goal: Level of anxiety will decrease Outcome: Progressing   Problem: Skin Integrity: Goal: Risk for impaired skin integrity will decrease Outcome: Progressing   Problem: Education: Goal: Ability to describe self-care measures that may prevent or decrease complications (Diabetes Survival Skills Education) will improve Outcome: Progressing Goal: Individualized Educational Video(s) Outcome: Progressing   Problem: Coping: Goal: Ability to adjust to condition or change in health will improve Outcome: Progressing   Problem: Fluid Volume: Goal: Ability to maintain a balanced intake and output will improve Outcome: Progressing   Problem: Health Behavior/Discharge Planning: Goal: Ability to identify and utilize available resources and services will improve Outcome: Progressing Goal: Ability to manage health-related needs will improve Outcome: Progressing   Problem: Metabolic: Goal: Ability to maintain appropriate glucose levels will improve Outcome: Progressing   Problem: Skin Integrity: Goal: Risk for impaired skin integrity will decrease Outcome: Progressing   Problem: Tissue Perfusion: Goal: Adequacy of tissue perfusion will improve Outcome: Progressing   Problem: Education: Goal: Knowledge of disease and its progression will improve Outcome: Progressing Goal: Individualized Educational Video(s) Outcome:  Progressing   Problem: Fluid Volume: Goal: Compliance with measures to maintain balanced fluid volume will improve Outcome: Progressing   Problem: Health Behavior/Discharge Planning: Goal: Ability to manage health-related needs will improve Outcome: Progressing   Problem: Nutritional: Goal: Ability to make healthy dietary choices will improve Outcome: Progressing   Problem: Clinical Measurements: Goal: Complications related to the disease process, condition or treatment will be avoided or minimized Outcome: Progressing

## 2023-10-27 NOTE — TOC Progression Note (Signed)
Transition of Care [facility]) - Progression Note    Patient Details  Name: [name] MRN: [id number] Date of Birth: 09-03-1981  Transition of Care [facility]) CM/SW Contact  Erin Sons, [name] Phone Number: 10/27/2023, 11:57 AM  Clinical Narrative:     Renal navigator still seeking OPHD location change for [facility].   CSW inquired with Faythe Casa again with [facility] about possible bed offer; informed they have no male beds available.   Expected Discharge Plan: Skilled Nursing Facility Barriers to Discharge: Continued Medical Work up  Expected Discharge Plan and Services                                               Social Determinants of Health (SDOH) Interventions SDOH Screenings   Food Insecurity: No Food Insecurity (09/09/2023)  Housing: Unknown (09/09/2023)  Transportation Needs: No Transportation Needs (09/09/2023)  Utilities: Not At Risk (09/09/2023)  Alcohol Screen: Low Risk  (02/15/2023)  Depression (PHQ2-9): Low Risk  (02/15/2023)  Financial Resource Strain: Low Risk  (02/15/2023)  Physical Activity: Inactive (02/15/2023)  Social Connections: Socially Integrated (02/15/2023)  Stress: No Stress Concern Present (02/15/2023)  Tobacco Use: Low Risk  (09/12/2023)  Recent Concern: Tobacco Use - Medium Risk (09/01/2023)   Received from [facility]    Readmission Risk Interventions     No data to display

## 2023-10-27 NOTE — Progress Notes (Signed)
Subjective: Said tolerated 4 L dialysis UF yesterday, currently no shortness of breath or chest pain  Objective Vital signs in last 24 hours: Vitals:   10/27/23 0458 10/27/23 0510 10/27/23 0600 10/27/23 0601  BP: (!) 79/40 111/77 (!) 90/47 103/65  Pulse: (!) 110 (!) 111 (!) 113 (!) 113  Resp: 18 18 18 18   Temp: 98.4 F (36.9 C) 98.4 F (36.9 C) 98.4 F (36.9 C) 98.4 F (36.9 C)  TempSrc:      SpO2: 90% 98% 98% 92%  Weight:      Height:       Weight change:   Physical Exam: General: Alert male in NAD, facial edema Heart: RRR, no murmurs, rubs or gallops Lungs: CTA bilaterally, respirations unlabored on RA Abdomen: Soft, +BS Extremities: Trace pedal edema , 1+ edema b/l upper extremities Dialysis Access: L femoral TDC    OP dialysis Orders: MWF - [facility] -> may be will be going to [place] TTS on discharge 4hr, 400/A1.5, EDW 77kg, L femoral TDC, 2K/3Ca bath, no heparin   Problem/Plan: MRSA bacteremia with MV/TV endocarditis + brain abscess:  Not a candidate for valve surgery, S/p line holiday and brain abscess drainage (at [facility]). Continues on extended Vanc course - will be on Vanc IV q HD until 10/27/23. S/p transfer to [facility] and drainage 08/31/24. Repeat brain MRI 10/17/23 showed improvement.  Acute B CVA/septic emboli- due to above Acute LUE DVT: On Eliquis ESRD: Transitioned to TTS, next HD 1/30 HTN/volume: BP stable, CT 10/09/23 with diffuse anasarca. Significantly above EDW. Max UF with HD. Continue to try to enforce fluid restrictions/compliance with full HD time Anemia of ESRD: Hgb 10.1, Aranesp to 150 mcg q Sunday while here. Last 1/19.  No IV iron while on abx. Secondary HPTH: CorrCa/Phos high. Continue to hold calcitriol. Sevelamer increased to 3/meals.  Continues to have outside food and refuses any diet except regular (chronic problem with him despite counseling)  8. Hyperkalemia:  K 4.3 , had been persistently high despite Lokelma 10g TID,  using low bath intermittently with HD.  As noted above diet indiscretion despite counseling  9. Nutrition: Alb 3.3  continue supplements.  10. T1DM: per primary 11. Dispo: SNF being arranged - not finalized yet  Lenny Pastel, PA-C [facility] Beeper [phone] 10/27/2023,11:33 AM  LOS: 48 days   Labs: Basic Metabolic Panel: Recent Labs  Lab 10/25/23 0417 10/26/23 1100  NA 133* 137  K 4.3 5.2*  CL 93* 97*  CO2 24 29  GLUCOSE 205* 147*  BUN 33* 42*  CREATININE 5.72* 7.43*  CALCIUM 9.3 9.3  PHOS 6.5* 7.5*   Liver Function Tests: Recent Labs  Lab 10/25/23 0417 10/26/23 1100  ALBUMIN 3.5 3.3*   No results for input(s): "LIPASE", "AMYLASE" in the last 168 hours. No results for input(s): "AMMONIA" in the last 168 hours. CBC: Recent Labs  Lab 10/26/23 1100  WBC 5.0  HGB 8.4*  HCT 28.9*  MCV 94.4  PLT 163   Cardiac Enzymes: No results for input(s): "CKTOTAL", "CKMB", "CKMBINDEX", "TROPONINI" in the last 168 hours. CBG: Recent Labs  Lab 10/25/23 2224 10/26/23 0734 10/26/23 1701 10/26/23 2057 10/27/23 0740  GLUCAP 201* 115* 120* 230* 201*    Studies/Results: No results found. Medications:  vancomycin (VANCOCIN) 750 mg in sodium chloride 0.9 % 250 mL IVPB Stopped (10/23/23 1824)    (feeding supplement) PROSource Plus  30 mL Oral BID BM   apixaban  5 mg Oral BID  bacitracin   Topical Daily   Chlorhexidine Gluconate Cloth  6 each Topical Q0600   [START ON 10/28/2023] Chlorhexidine Gluconate Cloth  6 each Topical Q0600   darbepoetin (ARANESP) injection - DIALYSIS  100 mcg Subcutaneous Q Sun-1800   gabapentin  100 mg Oral TID   Gerhardt's butt cream   Topical BID   insulin aspart  0-5 Units Subcutaneous QHS   insulin aspart  0-6 Units Subcutaneous TID WC   insulin glargine-yfgn  6 Units Subcutaneous Daily   loratadine  10 mg Oral Daily   midodrine  10 mg Oral Q T,Th,Sa-HD   pantoprazole  40 mg Oral Daily   povidone-Iodine   Topical Q0600    QUEtiapine  12.5 mg Oral q morning   QUEtiapine  25 mg Oral QHS   senna-docusate  2 tablet Oral BID   sevelamer carbonate  2,400 mg Oral TID WC   sodium zirconium cyclosilicate  10 g Oral TID

## 2023-10-28 LAB — BASIC METABOLIC PANEL
Anion gap: 11 (ref 5–15)
BUN: 32 mg/dL — ABNORMAL HIGH (ref 6–20)
CO2: 27 mmol/L (ref 22–32)
Calcium: 8.9 mg/dL (ref 8.9–10.3)
Chloride: 97 mmol/L — ABNORMAL LOW (ref 98–111)
Creatinine, Ser: 5.85 mg/dL — ABNORMAL HIGH (ref 0.61–1.24)
GFR, Estimated: 11 mL/min — ABNORMAL LOW (ref >60)
Glucose, Bld: 228 mg/dL — ABNORMAL HIGH (ref 70–99)
Potassium: 4.8 mmol/L (ref 3.5–5.1)
Sodium: 135 mmol/L (ref 135–145)

## 2023-10-28 LAB — CBC
HCT: 29.2 % — ABNORMAL LOW (ref 39.0–52.0)
Hemoglobin: 8.6 g/dL — ABNORMAL LOW (ref 13.0–17.0)
MCH: 27.7 pg (ref 26.0–34.0)
MCHC: 29.5 g/dL — ABNORMAL LOW (ref 30.0–36.0)
MCV: 94.2 fL (ref 80.0–100.0)
Platelets: 158 10*3/uL (ref 150–400)
RBC: 3.1 MIL/uL — ABNORMAL LOW (ref 4.22–5.81)
RDW: 16 % — ABNORMAL HIGH (ref 11.5–15.5)
WBC: 4.8 10*3/uL (ref 4.0–10.5)
nRBC: 0 % (ref 0.0–0.2)

## 2023-10-28 LAB — GLUCOSE, CAPILLARY
Glucose-Capillary: 184 mg/dL — ABNORMAL HIGH (ref 70–99)
Glucose-Capillary: 128 mg/dL — ABNORMAL HIGH (ref 70–99)
Glucose-Capillary: 185 mg/dL — ABNORMAL HIGH (ref 70–99)
Glucose-Capillary: 265 mg/dL — ABNORMAL HIGH (ref 70–99)

## 2023-10-28 LAB — MAGNESIUM: Magnesium: 2.1 mg/dL (ref 1.7–2.4)

## 2023-10-28 MED ORDER — ALBUMIN HUMAN 25 % IV SOLN
25.0000 g | Freq: Once | INTRAVENOUS | Status: AC
  Administered 2023-10-28: 25 g via INTRAVENOUS
  Filled 2023-10-28: qty 100

## 2023-10-28 MED ORDER — HEPARIN SODIUM (PORCINE) 1000 UNIT/ML IJ SOLN
INTRAMUSCULAR | Status: AC
  Filled 2023-10-28: qty 4

## 2023-10-28 NOTE — Progress Notes (Signed)
Spoke to [employer] with [facility] out-pt HD this morning. Pt has been denied by [facility] as well. Pt has been denied by [facility], [facility], and [facility]. [facility] is at capacity. Will provide update to CSW. Will assist as needed.   Olivia Canter Renal Navigator [phone]

## 2023-10-28 NOTE — Plan of Care (Signed)
  Problem: Health Behavior/Discharge Planning: Goal: Ability to manage health-related needs will improve Outcome: Progressing   Problem: Clinical Measurements: Goal: Ability to maintain clinical measurements within normal limits will improve Outcome: Progressing Goal: Will remain free from infection Outcome: Progressing Goal: Diagnostic test results will improve Outcome: Progressing Goal: Cardiovascular complication will be avoided Outcome: Progressing   Problem: Coping: Goal: Level of anxiety will decrease Outcome: Progressing   Problem: Coping: Goal: Ability to adjust to condition or change in health will improve Outcome: Progressing   Problem: Activity: Goal: Risk for activity intolerance will decrease Outcome: Not Progressing   Problem: Skin Integrity: Goal: Risk for impaired skin integrity will decrease Outcome: Not Progressing   Problem: Fluid Volume: Goal: Ability to maintain a balanced intake and output will improve Outcome: Not Progressing    One minute he praises me for the care I am giving and the next time I enter the room he says I have an attitude. Pt with many requests for his comfort but delays meds and getting out of bed. L side paralysis/AKA limits him but has difficulty turning in bed. Does not follow fluid restriction. Has ordered a huge meal from [employer] and says he does every night for supper plus a tray from cafeteria. Nonreceptive to education.

## 2023-10-28 NOTE — Progress Notes (Signed)
PROGRESS NOTE    [name]  NGE:[id number] DOB: 06/02/1981 DOA: 09/09/2023 PCP: [name], NP   Brief Narrative:  43 y.o. male with medical history significant of ESRD on hemodialysis (not fully compliant), insulin-dependent type 1 diabetes, gastroparesis, Left AKA, admitted to [facility] on 07/09/23 for DKA, Sepsis and hospital course significant for MV AND TV  MRSA endocarditis, followed by acute MCA stroke , left occipital and bilateral cerebellar strokes consistent with septic emboli, brain abscess from MRSA, left IJ DVT was transferred to [facility] for MV repair on 11/29.  While at [facility] he underwent cardiac cath showing non obstructive disease, craniotomy with abscess drainage on 09/01/23,. He had subarachnoid hemorrhage while on IV heparin, right third toe distal necrosis,was seen by podiatry, but family wanted conservative management. Since his tertiary care needs have been completed, he was transferred back to [facility] for further management.  PTOT  recommending SNF and awaiting placement.  He still needs outpatient hemodialysis set up which may take several more days.  Assessment & Plan:    MRSA TV/MV endocarditis: 10/12, blood cultures positive for MRSA. 10/13, TTE showed LVEF of 25-30% with concern for AV/MV abnormality  10/16 and 10/22, TEE showed MV/TV vegetations.  Currently on IV vancomycin.  Per ID recommendation: complete 8 weeks of treatment EOT 1/29. No longer on antibiotics.  Recommend outpatient follow-up with ID.   Hypotension: Resolved.  Blood pressure intermittently on the higher side. DC'd midodrine on 10/21/2023.     ESRD on HD:  Nephrology on board.  Continue hemodialysis as per nephrology.   Acute CVA /acute metabolic encephalopathy: Mri brain on 10/17 showed large right MCA infarct.  11/25, MRA head showed significant for large necrotic appearing lesion within the right frontal lobe within area of the prior right MCA territory infarct with vasogenic edema and  8 mm leftward midline shift.  Neurology recommendations for Eliquis.  PT/OT recommendations for SNF. Repeat MRI brain done for increased confusion. Showed decreased size of area of abnormal diffusion restriction within the posterior right frontal operculum compatible with reported abscess. The amount of surrounding edema has also decreased -Neurology has already signed off: Outpatient follow-up with neurology.   Cerebral emboli with abscess/ Cerebritis -Secondary to to MRSA Endocarditis.  -S/p craniotomy and abscess drainage.    Type 1 DM uncontrolled with hyperglycemia and hypoglycemia: -Continue long-acting insulin.  Continue CBGs with SSI.  Carb modified diet.   Acute left upper extremity DVT: -Continue Eliquis.  -Left upper extremity venous duplex showed age-indeterminate DVT involving the left axillary vein.   Anemia of chronic kidney disease: -Hemoglobin currently stable.  Monitor intermittently   Right third toe necrosis: H/o prior left BKA: Physical deconditioning Per documentation, patient/family opted for conservative management and declined surgical management. Patient was seen by PT/OT with recommendation for SNF on discharge.   Prostate fluid collection: Urology evaluated patient at [facility], suggested that its a dilated seminal vesicle and recommended for no surgical intervention.    Mild cognitive dysfunction with behavioral changes.  Currently stable on seroquel.   Obesity -Outpatient follow-up   DVT prophylaxis: Eliquis Code Status: Full Family Communication: None at bedside Disposition Plan: Status is: Inpatient Remains inpatient appropriate because: Of severity of illness.  Need for SNF placement once outpatient HD arranged.  Currently medically stable for discharge    Consultants: Nephrology/psychiatry/ID      Subjective: In HD   objective: Vitals:   10/28/23 0501 10/28/23 0805 10/28/23 0943 10/28/23 1000  BP: 118/69 (!) 138/98 (!) 155/104  129/83  Pulse: (!) 110 (!) 112 (!) 120 (!) 119  Resp: 18 18 13 15   Temp: 98.9 F (37.2 C) 97.7 F (36.5 C) 97.6 F (36.4 C)   TempSrc:  Oral    SpO2: 94% 99% 92% 97%  Weight:   97.4 kg   Height:        Intake/Output Summary (Last 24 hours) at 10/28/2023 1103 Last data filed at 10/28/2023 0912 Gross per 24 hour  Intake 480 ml  Output 0 ml  Net 480 ml   Filed Weights   10/26/23 1148 10/26/23 1626 10/28/23 0943  Weight: 96.1 kg 94.9 kg 97.4 kg    Examination:   General: Appearance:    Obese male in no acute distress     Lungs:      respirations unlabored  Heart:    Tachycardic.   MS:   Amputations noted  Neurologic:   Poor historian     Data Reviewed: I have personally reviewed following labs and imaging studies  CBC: Recent Labs  Lab 10/26/23 1100 10/28/23 0356  WBC 5.0 4.8  HGB 8.4* 8.6*  HCT 28.9* 29.2*  MCV 94.4 94.2  PLT 163 158   Basic Metabolic Panel: Recent Labs  Lab 10/25/23 0417 10/26/23 1100 10/28/23 0356  NA 133* 137 135  K 4.3 5.2* 4.8  CL 93* 97* 97*  CO2 24 29 27   GLUCOSE 205* 147* 228*  BUN 33* 42* 32*  CREATININE 5.72* 7.43* 5.85*  CALCIUM 9.3 9.3 8.9  MG  --   --  2.1  PHOS 6.5* 7.5*  --    GFR: Estimated Creatinine Clearance: 17.9 mL/min (A) (by C-G formula based on SCr of 5.85 mg/dL (H)). Liver Function Tests: Recent Labs  Lab 10/25/23 0417 10/26/23 1100  ALBUMIN 3.5 3.3*   No results for input(s): "LIPASE", "AMYLASE" in the last 168 hours. No results for input(s): "AMMONIA" in the last 168 hours. Coagulation Profile: No results for input(s): "INR", "PROTIME" in the last 168 hours. Cardiac Enzymes: No results for input(s): "CKTOTAL", "CKMB", "CKMBINDEX", "TROPONINI" in the last 168 hours. BNP (last 3 results) No results for input(s): "PROBNP" in the last 8760 hours. HbA1C: No results for input(s): "HGBA1C" in the last 72 hours. CBG: Recent Labs  Lab 10/27/23 0740 10/27/23 1142 10/27/23 1617 10/27/23 2058  10/28/23 0802  GLUCAP 201* 229* 161* 214* 184*   Lipid Profile: No results for input(s): "CHOL", "HDL", "LDLCALC", "TRIG", "CHOLHDL", "LDLDIRECT" in the last 72 hours. Thyroid Function Tests: No results for input(s): "TSH", "T4TOTAL", "FREET4", "T3FREE", "THYROIDAB" in the last 72 hours. Anemia Panel: No results for input(s): "VITAMINB12", "FOLATE", "FERRITIN", "TIBC", "IRON", "RETICCTPCT" in the last 72 hours. Sepsis Labs: No results for input(s): "PROCALCITON", "LATICACIDVEN" in the last 168 hours.  No results found for this or any previous visit (from the past 240 hours).       Radiology Studies: No results found.      Scheduled Meds:  (feeding supplement) PROSource Plus  30 mL Oral BID BM   apixaban  5 mg Oral BID   bacitracin   Topical Daily   Chlorhexidine Gluconate Cloth  6 each Topical Q0600   darbepoetin (ARANESP) injection - DIALYSIS  100 mcg Subcutaneous Q Sun-1800   gabapentin  100 mg Oral TID   Gerhardt's butt cream   Topical BID   insulin aspart  0-5 Units Subcutaneous QHS   insulin aspart  0-6 Units Subcutaneous TID WC   insulin glargine-yfgn  6 Units Subcutaneous Daily  loratadine  10 mg Oral Daily   midodrine  10 mg Oral Q T,Th,Sa-HD   pantoprazole  40 mg Oral Daily   povidone-Iodine   Topical Q0600   QUEtiapine  12.5 mg Oral q morning   QUEtiapine  25 mg Oral QHS   senna-docusate  2 tablet Oral BID   sevelamer carbonate  2,400 mg Oral TID WC   sodium zirconium cyclosilicate  10 g Oral TID   Continuous Infusions:  albumin human          Total care time: 55 minutes.  Joseph Art, DO [facility] 10/28/2023, 11:03 AM

## 2023-10-28 NOTE — Progress Notes (Signed)
Subjective:  on hd , lowish bp will give albumin , only cos= "when you getting me out of here "   Objective Vital signs in last 24 hours: Vitals:   10/27/23 1100 10/27/23 2100 10/28/23 0501 10/28/23 0805  BP: 139/80 (!) 124/91 118/69 (!) 138/98  Pulse: (!) 110  (!) 110 (!) 112  Resp: 18 18 18 18   Temp: 98.3 F (36.8 C) 97.9 F (36.6 C) 98.9 F (37.2 C) 97.7 F (36.5 C)  TempSrc:    Oral  SpO2:  98% 94% 99%  Weight:      Height:       Weight change:   PPhysical Exam: General: Alert male in NAD, facial edema Heart: RRR, no murmurs, rubs or gallops Lungs: CTA bilaterally, respirations unlabored on RA Abdomen: Soft, +BS Extremities: Trace pedal edema , 1+ edema b/l upper extremities Dialysis Access: L femoral TDC  patent on hd    OP dialysis Orders: MWF - [facility] -> may be will be going to [place] [facility] on discharge 4hr, 400/A1.5, EDW 77kg, L femoral TDC, 2K/3Ca bath, no heparin   Problem/Plan: MRSA bacteremia with MV/TV endocarditis + brain abscess:  Not a candidate for valve surgery, S/p line holiday and brain abscess drainage (at [facility]). Continues on extended Vanc course - will be on Vanc IV q HD until 10/27/23. S/p transfer to [facility] and drainage 08/31/24. Repeat brain MRI 10/17/23 showed improvement.  Acute B CVA/septic emboli- due to above Acute LUE DVT: On Eliquis ESRD: Transitioned to TTS,  on schedule HTN/volume: BP stable, pre hd  drop with uf  today  give albumin , CT 10/09/23 with diffuse anasarca. Significantly above EDW. But bed wts  Max UF with HD. Continue to try to enforce fluid restrictions/compliance with full HD time Anemia of ESRD: Hgb 8.4<10.1, ??if dilutional , fu trend Aranesp to 150 mcg q Sunday while here. Last 1/19.  No IV iron while on abx. Secondary HPTH: CorrCa/Phos high. Continue to hold calcitriol. Sevelamer increased to 3/meals.  Continues to have outside food and refuses any diet except regular (chronic problem with him despite  counseling)  8. Hyperkalemia:  K 4.8 , had been persistently high despite Lokelma 10g TID, using low bath intermittently with HD.  As noted above diet indiscretion despite counseling  9. Nutrition: Alb 3.3  continue supplements.  10. T1DM: per primary 11. Dispo: SNF being arranged - not finalized yet  Lenny Pastel, PA-C [facility] [phone] 10/28/2023,10:08 AM  LOS: 49 days   Labs: Basic Metabolic Panel: Recent Labs  Lab 10/25/23 0417 10/26/23 1100 10/28/23 0356  NA 133* 137 135  K 4.3 5.2* 4.8  CL 93* 97* 97*  CO2 24 29 27   GLUCOSE 205* 147* 228*  BUN 33* 42* 32*  CREATININE 5.72* 7.43* 5.85*  CALCIUM 9.3 9.3 8.9  PHOS 6.5* 7.5*  --    Liver Function Tests: Recent Labs  Lab 10/25/23 0417 10/26/23 1100  ALBUMIN 3.5 3.3*   No results for input(s): "LIPASE", "AMYLASE" in the last 168 hours. No results for input(s): "AMMONIA" in the last 168 hours. CBC: Recent Labs  Lab 10/26/23 1100 10/28/23 0356  WBC 5.0 4.8  HGB 8.4* 8.6*  HCT 28.9* 29.2*  MCV 94.4 94.2  PLT 163 158   Cardiac Enzymes: No results for input(s): "CKTOTAL", "CKMB", "CKMBINDEX", "TROPONINI" in the last 168 hours. CBG: Recent Labs  Lab 10/27/23 0740 10/27/23 1142 10/27/23 1617 10/27/23 1191 10/28/23 0802  GLUCAP  201* 229* 161* 214* 184*    Studies/Results: No results found. Medications:   (feeding supplement) PROSource Plus  30 mL Oral BID BM   apixaban  5 mg Oral BID   bacitracin   Topical Daily   Chlorhexidine Gluconate Cloth  6 each Topical Q0600   darbepoetin (ARANESP) injection - DIALYSIS  100 mcg Subcutaneous Q Sun-1800   gabapentin  100 mg Oral TID   Gerhardt's butt cream   Topical BID   insulin aspart  0-5 Units Subcutaneous QHS   insulin aspart  0-6 Units Subcutaneous TID WC   insulin glargine-yfgn  6 Units Subcutaneous Daily   loratadine  10 mg Oral Daily   midodrine  10 mg Oral Q T,Th,Sa-HD   pantoprazole  40 mg Oral Daily   povidone-Iodine    Topical Q0600   QUEtiapine  12.5 mg Oral q morning   QUEtiapine  25 mg Oral QHS   senna-docusate  2 tablet Oral BID   sevelamer carbonate  2,400 mg Oral TID WC   sodium zirconium cyclosilicate  10 g Oral TID

## 2023-10-28 NOTE — Progress Notes (Signed)
PT Cancellation Note  Patient Details Name: [name] MRN: [id number] DOB: 20-Aug-1981   Cancelled Treatment:    Reason Eval/Treat Not Completed: Patient at procedure or test/unavailable, pt off unit for HD this morning, will follow up in PM for session after HD as time/schedule allow.   Vickki Muff, PT, DPT   Acute Rehabilitation Department Office [phone] Secure Chat Communication Preferred   Ronnie Derby 10/28/2023, 10:05 AM

## 2023-10-28 NOTE — Progress Notes (Signed)
   10/28/23 1343  Vitals  Temp 98.6 F (37 C)  Temp Source Oral  BP 116/66  MAP (mmHg) 78  BP Location Right Leg  BP Method Automatic  Patient Position (if appropriate) Lying  Pulse Rate Source Monitor  ECG Heart Rate (!) 119  Resp 14  During Treatment Monitoring  Blood Flow Rate (mL/min) 0 mL/min  Arterial Pressure (mmHg) 9.9 mmHg  Venous Pressure (mmHg) -1.21 mmHg  TMP (mmHg) -52.32 mmHg  Ultrafiltration Rate (mL/min) 1730 mL/min  Dialysate Flow Rate (mL/min) 300 ml/min  Duration of HD Treatment -hour(s) 3.42 hour(s)  Cumulative Fluid Removed (mL) per Treatment  2502.13  Post Treatment  Dialyzer Clearance Lightly streaked  Hemodialysis Intake (mL) 0 mL  Liters Processed 67.3  Fluid Removed (mL) 2500 mL  Tolerated HD Treatment Yes  Post-Hemodialysis Comments tx ended early per request  Note  Patient Observations alert no c/o  Hemodialysis Catheter Left Femoral vein Double lumen Permanent (Tunneled)  Placement Date/Time: 07/16/23 1318   Serial / Lot #: [id number]  Expiration Date: 05/24/26  Time Out: Correct patient;Correct site;Correct procedure  Maximum sterile barrier precautions: Hand hygiene;Cap;Mask;Sterile gown;Sterile gloves;Large sterile ...  Site Condition No complications  Blue Lumen Status Blood return noted;Heparin locked  Red Lumen Status Blood return noted;Heparin locked  Purple Lumen Status N/A  Catheter fill solution Heparin 1000 units/ml  Catheter fill volume (Arterial) 2.1 cc  Catheter fill volume (Venous) 2.1  Dressing Type Transparent  Dressing Status Clean, Dry, Intact  Drainage Description None  Dressing Change Due 11/02/23  Post treatment catheter status Capped and Clamped   Received patient in bed to unit.  Alert and oriented.  Informed consent signed and in chart.   TX duration:3:25  Patient tolerated well.  Transported back to the room  Alert, without acute distress.  Hand-off given to patient's nurse.   Access used: l femoral hd  cath Access issues: none  Total UF removed: 2500 Medication(s) given: oxyxontin 5mg , albumin 25gm, midodrine 10mg  Post HD VS: see above Post HD weight: 95.4kg   [facility]

## 2023-10-29 LAB — GLUCOSE, CAPILLARY
Glucose-Capillary: 388 mg/dL — ABNORMAL HIGH (ref 70–99)
Glucose-Capillary: 344 mg/dL — ABNORMAL HIGH (ref 70–99)
Glucose-Capillary: 259 mg/dL — ABNORMAL HIGH (ref 70–99)
Glucose-Capillary: 203 mg/dL — ABNORMAL HIGH (ref 70–99)

## 2023-10-29 MED ORDER — INSULIN GLARGINE-YFGN 100 UNIT/ML ~~LOC~~ SOLN
8.0000 [IU] | Freq: Every day | SUBCUTANEOUS | Status: DC
  Administered 2023-10-30 – 2023-10-31 (×2): 8 [IU] via SUBCUTANEOUS
  Filled 2023-10-29 (×3): qty 0.08

## 2023-10-29 MED ORDER — PROPOFOL 10 MG/ML IV BOLUS
INTRAVENOUS | Status: AC
  Filled 2023-10-29: qty 20

## 2023-10-29 MED ORDER — INSULIN GLARGINE-YFGN 100 UNIT/ML ~~LOC~~ SOLN
8.0000 [IU] | Freq: Once | SUBCUTANEOUS | Status: AC
Start: 2023-10-29 — End: 2023-10-29
  Administered 2023-10-29: 8 [IU] via SUBCUTANEOUS
  Filled 2023-10-29: qty 0.08

## 2023-10-29 NOTE — Progress Notes (Signed)
Physical Therapy Treatment Patient Details Name: [name] MRN: [id number] DOB: 04/04/1981 Today's Date: 10/29/2023   History of Present Illness 43 y.o. male presents to [facility] 09/09/23 as a transfer from [facility] for further management of endocarditis. Pt originally was admitted to [facility] 07/09/23 for DKA and sepsis after being found on the ground. 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 10/17 pt had a R MCA, L occipital, and B cerebellum CVA w/ septic emboli. ICU admit 10/18 w/ intubation 10/18-10/24. Pt was transferred to [facility] for MV repair on 11/29. At [facility], pt had cardiac cath w/ non obstructive disease, SAH, a craniotomy w/ abscess drainage on 12/4, and R third toe distal necrosis. PMH: chronic L AKA, MRSA, IDDM, ESRD on HD MWF, HTN, TEE positive for vegetation on multiple valves, cellulitis, medical noncompliance, narcotic dependence, DM with gastroparesis, L internal jugular DVT.    PT Comments  The pt asked PT to return to manage LUE in foam block, once I arrived, pt reports position is comfortable and his LUE is feeling better. Pt declined OOB mobility due to fatigue, pt continues to voice desire to work with LLE prosthetic. Therefore, I focused on LLE hip movements and pt is unable to manage AROM of L hip for flexion or abd/add at this time. I attempted to facilitate in sidelying and supine, pt tolerates without pain but with little ability to assist. Will need to work on LLE strength prior to resuming use of LLE prosthetic limb.     If plan is discharge home, recommend the following: Two people to help with walking and/or transfers;Assist for transportation;Supervision due to cognitive status;Help with stairs or ramp for entrance;Two people to help with bathing/dressing/bathroom;Assistance with cooking/housework;Direct supervision/assist for financial management;Direct supervision/assist for medications management   Can travel by private vehicle      No  Equipment Recommendations  Hoyer lift;Wheelchair (measurements PT);Wheelchair cushion (measurements PT);Other (comment)    Recommendations for Other Services       Precautions / Restrictions Precautions Precautions: Fall;Other (comment) Precaution Comments: L AKA, L hemiparesis and edema, L neglect, bowel incontinence. L femoral HD cath Restrictions Weight Bearing Restrictions Per Provider Order: No LLE Weight Bearing Per Provider Order: Non weight bearing Other Position/Activity Restrictions: Pt has sling for LUE when mobilizing OOB; LLE prosthetic not fitting     Mobility  Bed Mobility Overal bed mobility: Needs Assistance Bed Mobility: Rolling Rolling: +2 for safety/equipment, Total assist   Supine to sit: Max assist, +2 for safety/equipment, Used rails, HOB elevated Sit to supine: Max assist, +2 for safety/equipment   General bed mobility comments: pt attempting to assist but unable to maintain in R sidelying position without totalA. attempted LLE ROM from sidelying but pt not able to manage    Transfers                   General transfer comment: pt declined due to pain anf fatigue after sitting EOB      Balance Overall balance assessment: Needs assistance Sitting-balance support: Single extremity supported, Feet supported Sitting balance-[name]: Poor Sitting balance - Comments: initially min-modA, pt falling back, progressed to CGA Postural control: Posterior lean                                  Cognition Arousal: Alert Behavior During Therapy: Flat affect Overall Cognitive Status: Impaired/Different from baseline Area of Impairment: Attention, Memory,  Following commands, Problem solving                   Current Attention Level: Selective Memory: Decreased short-term memory   Safety/Judgement: Decreased awareness of deficits Awareness: Intellectual Problem Solving: Slow processing, Difficulty sequencing General  Comments: pt benefits from increased time, poor attention to task, needing frequent redirection. pt also needing increased cues and instruction for importance of LE strength and ROM to meet his goals (use of prosthetic)        Exercises General Exercises - Upper Extremity Shoulder Flexion: PROM, Left, 10 reps Shoulder ABduction: PROM, Left, 10 reps Elbow Flexion: PROM, Left, 10 reps Elbow Extension: PROM, Left, 10 reps Wrist Flexion: PROM, Left, 10 reps Wrist Extension: PROM, Left, 10 reps Digit Composite Flexion: PROM, Left, 10 reps (partial ROM) Composite Extension: PROM, Left, 10 reps (partial ROM) General Exercises - Lower Extremity Ankle Circles/Pumps: AROM, Right, 10 reps Long Arc Quad: Strengthening, Right, 5 reps, Seated (against min resistance. pt declined further reps) Hip ABduction/ADduction: Supine, PROM, Left, 5 reps Hip Flexion/Marching: PROM, Left, 10 reps, Supine Other Exercises Other Exercises: retrograde massage L palm, hand left in foam block to maintain upright    General Comments General comments (skin integrity, edema, etc.): pt wanting to use porsthetic, encouraged to work on movement of LLE first without wt of prosthetic.      Pertinent Vitals/Pain Pain Assessment Pain Assessment: Faces Faces Pain Scale: Hurts even more Pain Location: buttock and wounds Pain Descriptors / Indicators: Sore, Discomfort Pain Intervention(s): Limited activity within patient's tolerance, Monitored during session, Repositioned     PT Goals (current goals can now be found in the care plan section) Acute Rehab PT Goals Patient Stated Goal: rehab at SNF PT Goal Formulation: With patient Time For Goal Achievement: 11/09/23 Potential to Achieve Goals: Fair Progress towards PT goals: Progressing toward goals    Frequency    Min 1X/week       AM-PAC PT "6 Clicks" Mobility   Outcome Measure  Help needed turning from your back to your side while in a flat bed without  using bedrails?: A Lot Help needed moving from lying on your back to sitting on the side of a flat bed without using bedrails?: Total Help needed moving to and from a bed to a chair (including a wheelchair)?: Total Help needed standing up from a chair using your arms (e.g., wheelchair or bedside chair)?: Total Help needed to walk in hospital room?: Total Help needed climbing 3-5 steps with a railing? : Total 6 Click Score: 7    End of Session Equipment Utilized During Treatment: Other (comment) (bed pad) Activity Tolerance: Patient tolerated treatment well Patient left: in bed;with call bell/phone within reach;with bed alarm set Nurse Communication: Mobility status;Need for lift equipment PT Visit Diagnosis: Other abnormalities of gait and mobility (R26.89);Other symptoms and signs involving the nervous system (R29.898);Hemiplegia and hemiparesis Hemiplegia - Right/Left: Left Hemiplegia - dominant/non-dominant: Non-dominant Hemiplegia - caused by: Cerebral infarction     Time: 8295-6213 PT Time Calculation (min) (ACUTE ONLY): 26 min  Charges:    $Therapeutic Exercise: 8-22 mins $Therapeutic Activity: 8-22 mins PT General Charges $$ ACUTE PT VISIT: 1 Visit                     Vickki Muff, PT, DPT   Acute Rehabilitation Department Office [phone] Secure Chat Communication Preferred   Ronnie Derby 10/29/2023, 4:02 PM

## 2023-10-29 NOTE — Plan of Care (Signed)
  Problem: Health Behavior/Discharge Planning: Goal: Ability to manage health-related needs will improve Outcome: Progressing   Problem: Clinical Measurements: Goal: Ability to maintain clinical measurements within normal limits will improve Outcome: Progressing Goal: Will remain free from infection Outcome: Progressing Goal: Diagnostic test results will improve Outcome: Progressing Goal: Cardiovascular complication will be avoided Outcome: Progressing   Problem: Activity: Goal: Risk for activity intolerance will decrease Outcome: Progressing   Problem: Coping: Goal: Level of anxiety will decrease Outcome: Progressing   Problem: Skin Integrity: Goal: Risk for impaired skin integrity will decrease Outcome: Progressing   Problem: Education: Goal: Ability to describe self-care measures that may prevent or decrease complications (Diabetes Survival Skills Education) will improve Outcome: Progressing Goal: Individualized Educational Video(s) Outcome: Progressing   Problem: Coping: Goal: Ability to adjust to condition or change in health will improve Outcome: Progressing   Problem: Fluid Volume: Goal: Ability to maintain a balanced intake and output will improve Outcome: Progressing   Problem: Health Behavior/Discharge Planning: Goal: Ability to identify and utilize available resources and services will improve Outcome: Progressing Goal: Ability to manage health-related needs will improve Outcome: Progressing   Problem: Metabolic: Goal: Ability to maintain appropriate glucose levels will improve Outcome: Progressing   Problem: Skin Integrity: Goal: Risk for impaired skin integrity will decrease Outcome: Progressing   Problem: Tissue Perfusion: Goal: Adequacy of tissue perfusion will improve Outcome: Progressing   Problem: Education: Goal: Knowledge of disease and its progression will improve Outcome: Progressing Goal: Individualized Educational Video(s) Outcome:  Progressing   Problem: Fluid Volume: Goal: Compliance with measures to maintain balanced fluid volume will improve Outcome: Progressing   Problem: Health Behavior/Discharge Planning: Goal: Ability to manage health-related needs will improve Outcome: Progressing   Problem: Nutritional: Goal: Ability to make healthy dietary choices will improve Outcome: Progressing   Problem: Clinical Measurements: Goal: Complications related to the disease process, condition or treatment will be avoided or minimized Outcome: Progressing  Did work c PT

## 2023-10-29 NOTE — Progress Notes (Signed)
PROGRESS NOTE    [name]  ZOX:[id number] DOB: 1980/11/30 DOA: 09/09/2023 PCP: [name], NP   Brief Narrative:  43 y.o. 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  While at [facility] he underwent cardiac cath showing non obstructive disease, craniotomy with abscess drainage on 09/01/23,. He had subarachnoid hemorrhage while on IV heparin, right third toe distal necrosis,was seen by podiatry, but family wanted conservative management. Since his tertiary care needs have been completed, he was transferred back to [facility] for further management.  PTOT  recommending SNF and awaiting placement.  He still needs outpatient hemodialysis set up which may take several more days.   Assessment & Plan:    MRSA TV/MV endocarditis: 10/12, blood cultures positive for MRSA. 10/13, TTE showed LVEF of 25-30% with concern for AV/MV abnormality  10/16 and 10/22, TEE showed MV/TV vegetations.  Currently on IV vancomycin.  Per ID recommendation: complete 8 weeks of treatment EOT 1/29. No longer on antibiotics.  Recommend outpatient follow-up with ID.   Hypotension: Resolved.  Blood pressure intermittently on the higher side. DC'd midodrine on 10/21/2023.     ESRD on HD:  Nephrology on board.  Continue hemodialysis as per nephrology.   Acute CVA /acute metabolic encephalopathy: Mri brain on 10/17 showed large right MCA infarct.  11/25, MRA head showed significant for large necrotic appearing lesion within the right frontal lobe within area of the prior right MCA territory infarct with vasogenic edema  and 8 mm leftward midline shift.  Neurology recommendations for Eliquis.  PT/OT recommendations for SNF. Repeat MRI brain done for increased confusion. Showed decreased size of area of abnormal diffusion restriction within the posterior right frontal operculum compatible with reported abscess. The amount of surrounding edema has also decreased -Neurology has already signed off: Outpatient follow-up with neurology.   Cerebral emboli with abscess/ Cerebritis -Secondary to to MRSA Endocarditis.  -S/p craniotomy and abscess drainage.    Type 1 DM uncontrolled with hyperglycemia and hypoglycemia: -Continue long-acting insulin- adjust dose.  Continue CBGs with SSI.  Not on carb mod diet   Acute left upper extremity DVT: -Continue Eliquis.  -Left upper extremity venous duplex showed age-indeterminate DVT involving the left axillary vein.   Anemia of chronic kidney disease: -Hemoglobin currently stable.  Monitor intermittently   Right third toe necrosis: H/o prior left BKA: Physical deconditioning Per documentation, patient/family opted for conservative management and declined surgical management. Patient was seen by PT/OT with recommendation for SNF on discharge.   Prostate fluid collection: Urology evaluated patient at [facility], suggested that its a dilated seminal vesicle and recommended for no surgical intervention.    Mild cognitive dysfunction with behavioral changes.  Currently stable on seroquel.   Obesity -Outpatient follow-up   DVT prophylaxis: Eliquis Code Status: Full Family Communication: None at bedside Disposition Plan: Status is: Inpatient Remains inpatient appropriate because: Of severity of illness.  Need for SNF placement once outpatient HD arranged.  Currently medically stable for discharge    Consultants: Nephrology/psychiatry/ID      Subjective: Asking about when he can be d/c'd   objective: Vitals:   10/28/23 1430 10/28/23 1647 10/28/23  2058  10/29/23 0520  BP: 130/68 133/89 (!) 154/116 (!) 151/79  Pulse: (!) 118 (!) 116  (!) 109  Resp: 16 18 18 18   Temp: 98.6 F (37 C) 98.1 F (36.7 C) 99.1 F (37.3 C) 98.7 F (37.1 C)  TempSrc: Oral Oral    SpO2: 100% 98% 99% 96%  Weight:      Height:        Intake/Output Summary (Last 24 hours) at 10/29/2023 1031 Last data filed at 10/28/2023 1515 Gross per 24 hour  Intake 200 ml  Output 2500 ml  Net -2300 ml   Filed Weights   10/26/23 1148 10/26/23 1626 10/28/23 0943  Weight: 96.1 kg 94.9 kg 97.4 kg    Examination:    General: Appearance:    Obese male in no acute distress     Lungs:     respirations unlabored  Heart:    Tachycardic.   MS:   amputations  Neurologic:   Awake, alert     Data Reviewed: I have personally reviewed following labs and imaging studies  CBC: Recent Labs  Lab 10/26/23 1100 10/28/23 0356  WBC 5.0 4.8  HGB 8.4* 8.6*  HCT 28.9* 29.2*  MCV 94.4 94.2  PLT 163 158   Basic Metabolic Panel: Recent Labs  Lab 10/25/23 0417 10/26/23 1100 10/28/23 0356  NA 133* 137 135  K 4.3 5.2* 4.8  CL 93* 97* 97*  CO2 24 29 27   GLUCOSE 205* 147* 228*  BUN 33* 42* 32*  CREATININE 5.72* 7.43* 5.85*  CALCIUM 9.3 9.3 8.9  MG  --   --  2.1  PHOS 6.5* 7.5*  --    GFR: Estimated Creatinine Clearance: 17.9 mL/min (A) (by C-G formula based on SCr of 5.85 mg/dL (H)). Liver Function Tests: Recent Labs  Lab 10/25/23 0417 10/26/23 1100  ALBUMIN 3.5 3.3*   No results for input(s): "LIPASE", "AMYLASE" in the last 168 hours. No results for input(s): "AMMONIA" in the last 168 hours. Coagulation Profile: No results for input(s): "INR", "PROTIME" in the last 168 hours. Cardiac Enzymes: No results for input(s): "CKTOTAL", "CKMB", "CKMBINDEX", "TROPONINI" in the last 168 hours. BNP (last 3 results) No results for input(s): "PROBNP" in the last 8760 hours. HbA1C: No results for input(s): "HGBA1C" in the last 72 hours. CBG: Recent Labs  Lab  10/28/23 0802 10/28/23 1429 10/28/23 1712 10/28/23 2053 10/29/23 0738  GLUCAP 184* 128* 185* 265* 388*   Lipid Profile: No results for input(s): "CHOL", "HDL", "LDLCALC", "TRIG", "CHOLHDL", "LDLDIRECT" in the last 72 hours. Thyroid Function Tests: No results for input(s): "TSH", "T4TOTAL", "FREET4", "T3FREE", "THYROIDAB" in the last 72 hours. Anemia Panel: No results for input(s): "VITAMINB12", "FOLATE", "FERRITIN", "TIBC", "IRON", "RETICCTPCT" in the last 72 hours. Sepsis Labs: No results for input(s): "PROCALCITON", "LATICACIDVEN" in the last 168 hours.  No results found for this or any previous visit (from the past 240 hours).       Radiology Studies: No results found.      Scheduled Meds:  (feeding supplement) PROSource Plus  30 mL Oral BID BM   apixaban  5 mg Oral BID   bacitracin   Topical Daily   Chlorhexidine Gluconate Cloth  6 each Topical Q0600   darbepoetin (ARANESP) injection - DIALYSIS  100 mcg Subcutaneous Q Sun-1800   gabapentin  100 mg Oral TID   Gerhardt's butt cream   Topical BID   insulin aspart  0-5 Units Subcutaneous QHS   insulin aspart  0-6 Units Subcutaneous  TID WC   insulin glargine-yfgn  6 Units Subcutaneous Daily   loratadine  10 mg Oral Daily   midodrine  10 mg Oral Q T,Th,Sa-HD   pantoprazole  40 mg Oral Daily   povidone-Iodine   Topical Q0600   QUEtiapine  12.5 mg Oral q morning   QUEtiapine  25 mg Oral QHS   senna-docusate  2 tablet Oral BID   sevelamer carbonate  2,400 mg Oral TID WC   sodium zirconium cyclosilicate  10 g Oral TID   Continuous Infusions:        Total care time: 55 minutes.  Joseph Art, DO [facility] 10/29/2023, 10:31 AM

## 2023-10-29 NOTE — TOC Progression Note (Signed)
Transition of Care [facility]) - Progression Note    Patient Details  Name: [name] MRN: [id number] Date of Birth: 12/21/1980  Transition of Care [facility]) CM/SW Contact  Carley Hammed, LCSW Phone Number: 10/29/2023, 3:27 PM  Clinical Narrative:    CSW was advised that pt wanted to speak with a Child psychotherapist, CSW spoke with pt via phone. Pt pleasant and cooperative but did express his frustration with the extended hospitalization and requested an update on progress. CSW advised that the issue is getting a facility near the HD clinic he is at, or getting a new HD clinic next to a facility he has been accepted to. Pt states he wants to keep his old HD clinic as he had a good support system there. Pt notes He wants to go to [facility], and knows they currently do not have any beds. Per pt, [facility] told him there was a bed available a few weeks ago, but they did not have a private bed so he had declined. He is aware that requiring a private bed may extend his stay. CSW provided emotional support and advised that the team was working diligently to get him to rehab so he can go home with his family. Pt noting understanding and thanks. TOC will continue to follow.    Expected Discharge Plan: Skilled Nursing Facility Barriers to Discharge: Continued Medical Work up  Expected Discharge Plan and Services                                               Social Determinants of Health (SDOH) Interventions SDOH Screenings   Food Insecurity: No Food Insecurity (09/09/2023)  Housing: Unknown (09/09/2023)  Transportation Needs: No Transportation Needs (09/09/2023)  Utilities: Not At Risk (09/09/2023)  Alcohol Screen: Low Risk  (02/15/2023)  Depression (PHQ2-9): Low Risk  (02/15/2023)  Financial Resource Strain: Low Risk  (02/15/2023)  Physical Activity: Inactive (02/15/2023)  Social Connections: Socially Integrated (02/15/2023)  Stress: No Stress Concern Present (02/15/2023)  Tobacco Use: Low Risk   (09/12/2023)  Recent Concern: Tobacco Use - Medium Risk (09/01/2023)   Received from [facility]    Readmission Risk Interventions     No data to display

## 2023-10-29 NOTE — Progress Notes (Signed)
[name] Progress Note   Subjective: HD yesterday NET UF 2.5.. He is awake, pleasant. No C/Os.   Objective Vitals:   10/28/23 1430 10/28/23 1647 10/28/23 2058 10/29/23 0520  BP: 130/68 133/89 (!) 154/116 (!) 151/79  Pulse: (!) 118 (!) 116  (!) 109  Resp: 16 18 18 18   Temp: 98.6 F (37 C) 98.1 F (36.7 C) 99.1 F (37.3 C) 98.7 F (37.1 C)  TempSrc: Oral Oral    SpO2: 100% 98% 99% 96%  Weight:      Height:       Physical Exam General: Chronically ill appearing male in NAD Heart: S1,S2 RRR No M/R/G Lungs: Few coarse upper airway rhonchi which clear with cough, otherwise CTAB. No WOB Abdomen:NABS, NT Extremities: L AKA no stump edema, no RLE edema Dialysis Access: L femoral TDC Drsg intact    Additional Objective Labs: Basic Metabolic Panel: Recent Labs  Lab 10/25/23 0417 10/26/23 1100 10/28/23 0356  NA 133* 137 135  K 4.3 5.2* 4.8  CL 93* 97* 97*  CO2 24 29 27   GLUCOSE 205* 147* 228*  BUN 33* 42* 32*  CREATININE 5.72* 7.43* 5.85*  CALCIUM 9.3 9.3 8.9  PHOS 6.5* 7.5*  --    Liver Function Tests: Recent Labs  Lab 10/25/23 0417 10/26/23 1100  ALBUMIN 3.5 3.3*   No results for input(s): "LIPASE", "AMYLASE" in the last 168 hours. CBC: Recent Labs  Lab 10/26/23 1100 10/28/23 0356  WBC 5.0 4.8  HGB 8.4* 8.6*  HCT 28.9* 29.2*  MCV 94.4 94.2  PLT 163 158   Blood Culture    Component Value Date/Time   SDES BLOOD SITE NOT SPECIFIED 08/25/2023 1706   SPECREQUEST  08/25/2023 1706    BOTTLES DRAWN AEROBIC ONLY Blood Culture results may not be optimal due to an inadequate volume of blood received in culture bottles   CULT  08/25/2023 1706    NO GROWTH 5 DAYS Performed at [facility], [address], [place], [name] [address]    REPTSTATUS 08/30/2023 FINAL 08/25/2023 1706    Cardiac Enzymes: No results for input(s): "CKTOTAL", "CKMB", "CKMBINDEX", "TROPONINI" in the last 168 hours. CBG: Recent Labs  Lab 10/28/23 0802  10/28/23 1429 10/28/23 1712 10/28/23 2053 10/29/23 0738  GLUCAP 184* 128* 185* 265* 388*   Iron Studies: No results for input(s): "IRON", "TIBC", "TRANSFERRIN", "FERRITIN" in the last 72 hours. @lablastinr3 @ Studies/Results: No results found. Medications:   (feeding supplement) PROSource Plus  30 mL Oral BID BM   apixaban  5 mg Oral BID   bacitracin   Topical Daily   Chlorhexidine Gluconate Cloth  6 each Topical Q0600   darbepoetin (ARANESP) injection - DIALYSIS  100 mcg Subcutaneous Q Sun-1800   gabapentin  100 mg Oral TID   Gerhardt's butt cream   Topical BID   insulin aspart  0-5 Units Subcutaneous QHS   insulin aspart  0-6 Units Subcutaneous TID WC   [START ON 10/30/2023] insulin glargine-yfgn  8 Units Subcutaneous Daily   loratadine  10 mg Oral Daily   midodrine  10 mg Oral Q T,Th,Sa-HD   pantoprazole  40 mg Oral Daily   povidone-Iodine   Topical Q0600   QUEtiapine  12.5 mg Oral q morning   QUEtiapine  25 mg Oral QHS   senna-docusate  2 tablet Oral BID   sevelamer carbonate  2,400 mg Oral TID WC   sodium zirconium cyclosilicate  10 g Oral TID     OP dialysis  Orders: MWF - [facility] -> may be will be going to [place] [facility] on discharge 4hr, 400/A1.5, EDW 77kg, L femoral TDC, 2K/3Ca bath, no heparin   Assessment/Plan: MRSA bacteremia with MV/TV endocarditis + brain abscess:  Not a candidate for valve surgery, S/p line holiday and brain abscess drainage (at [facility]). Continues on extended Vanc course - will be on Vanc IV q HD until 10/27/23. S/p transfer to [facility] and drainage 08/31/24. Repeat brain MRI 10/17/23 showed improvement.  Acute B CVA/septic emboli- due to above Acute LUE DVT: On Eliquis ESRD: Transitioned to TTS. Next HD 10/30/2023 HTN/volume: BP stable, pre hd  drop with uf  today  give albumin , CT 10/09/23 with diffuse anasarca. Significantly above EDW. But bed wts  Max UF with HD. Continue to try to enforce fluid restrictions/compliance with  full HD time Anemia of ESRD: Hgb 8.4<10.1, ??if dilutional , fu trend Aranesp to 150 mcg q Sunday while here. Last 1/19.  No IV iron while on abx. Secondary HPTH: CorrCa/Phos high. Continue to hold calcitriol. Sevelamer increased to 3/meals.  Continues to have outside food and refuses any diet except regular (chronic problem with him despite counseling)  Hyperkalemia: had been persistently high despite Lokelma 10g TID, using low bath intermittently with HD.  As noted above diet indiscretion despite counseling  Nutrition: Alb 3.3  continue supplements.  T1DM: per primary Dispo: SNF being arranged - not finalized yet  Khyleigh Furney H. Natika Geyer NP-C 10/29/2023, 10:46 AM  [employer] [phone]

## 2023-10-29 NOTE — Plan of Care (Signed)
  Problem: Health Behavior/Discharge Planning: Goal: Ability to manage health-related needs will improve Outcome: Progressing   Problem: Clinical Measurements: Goal: Ability to maintain clinical measurements within normal limits will improve Outcome: Progressing Goal: Will remain free from infection Outcome: Progressing Goal: Diagnostic test results will improve Outcome: Progressing Goal: Cardiovascular complication will be avoided Outcome: Progressing   Problem: Activity: Goal: Risk for activity intolerance will decrease Outcome: Progressing   Problem: Coping: Goal: Level of anxiety will decrease Outcome: Progressing   Problem: Skin Integrity: Goal: Risk for impaired skin integrity will decrease Outcome: Progressing   Problem: Education: Goal: Ability to describe self-care measures that may prevent or decrease complications (Diabetes Survival Skills Education) will improve Outcome: Progressing Goal: Individualized Educational Video(s) Outcome: Progressing   Problem: Coping: Goal: Ability to adjust to condition or change in health will improve Outcome: Progressing   Problem: Fluid Volume: Goal: Ability to maintain a balanced intake and output will improve Outcome: Progressing   Problem: Health Behavior/Discharge Planning: Goal: Ability to identify and utilize available resources and services will improve Outcome: Progressing Goal: Ability to manage health-related needs will improve Outcome: Progressing   Problem: Metabolic: Goal: Ability to maintain appropriate glucose levels will improve Outcome: Progressing   Problem: Skin Integrity: Goal: Risk for impaired skin integrity will decrease Outcome: Progressing   Problem: Tissue Perfusion: Goal: Adequacy of tissue perfusion will improve Outcome: Progressing   Problem: Education: Goal: Knowledge of disease and its progression will improve Outcome: Progressing Goal: Individualized Educational Video(s) Outcome:  Progressing   Problem: Fluid Volume: Goal: Compliance with measures to maintain balanced fluid volume will improve Outcome: Progressing   Problem: Health Behavior/Discharge Planning: Goal: Ability to manage health-related needs will improve Outcome: Progressing   Problem: Nutritional: Goal: Ability to make healthy dietary choices will improve Outcome: Progressing   Problem: Clinical Measurements: Goal: Complications related to the disease process, condition or treatment will be avoided or minimized Outcome: Progressing

## 2023-10-29 NOTE — Progress Notes (Signed)
Physical Therapy Treatment Patient Details Name: [name] MRN: [id number] DOB: [birth year] Today's Date: 10/29/2023   History of Present Illness 43 y.o. male presents to [facility] 09/09/23 as a transfer from [facility] for further management of endocarditis. Pt originally was admitted to [facility] 07/09/23 for DKA and sepsis after being found on the ground. 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 10/17 pt had a R MCA, L occipital, and B cerebellum CVA w/ septic emboli. ICU admit 10/18 w/ intubation 10/18-10/24. Pt was transferred to [facility] for MV repair on 11/29. At [facility], pt had cardiac cath w/ non obstructive disease, SAH, a craniotomy w/ abscess drainage on 12/4, and R third toe distal necrosis. PMH: chronic L AKA, MRSA, IDDM, ESRD on HD MWF, HTN, TEE positive for vegetation on multiple valves, cellulitis, medical noncompliance, narcotic dependence, DM with gastroparesis, L internal jugular DVT.    PT Comments  The pt was agreeable to session with focus on continued balance and mobility. He continues to need assistance to complete rolling and movement of RLE to assist with rolling. The pt demos poor functional core strength in addition to limited use of RUE and therefore requires assistance to transition to sitting EOB and to maintain initial sitting balance. He needed continued cues and encouragement for LE exercises, but was unable to bear enough wt through his RLE to complete scooting along EOB. Pt left with LUE in foam block to manage swelling. Continues to demo need for skilled PT to address deficits in strength, stability, activity tolerance, and OOB transfers.     If plan is discharge home, recommend the following: Two people to help with walking and/or transfers;Assist for transportation;Supervision due to cognitive status;Help with stairs or ramp for entrance;Two people to help with bathing/dressing/bathroom;Assistance with cooking/housework;Direct  supervision/assist for financial management;Direct supervision/assist for medications management   Can travel by private vehicle     No  Equipment Recommendations  Hoyer lift;Wheelchair (measurements PT);Wheelchair cushion (measurements PT);Other (comment)    Recommendations for Other Services       Precautions / Restrictions Precautions Precautions: Fall;Other (comment) Precaution Comments: L AKA, L hemiparesis and edema, L neglect, bowel incontinence. L femoral HD cath Restrictions Weight Bearing Restrictions Per Provider Order: No LLE Weight Bearing Per Provider Order: Non weight bearing Other Position/Activity Restrictions: Pt has sling for LUE when mobilizing OOB; LLE prosthetic not fitting     Mobility  Bed Mobility Overal bed mobility: Needs Assistance Bed Mobility: Rolling, Supine to Sit, Sit to Supine Rolling: Max assist, +2 for safety/equipment   Supine to sit: Max assist, +2 for safety/equipment, Used rails, HOB elevated Sit to supine: Max assist, +2 for safety/equipment   General bed mobility comments: pt encouraged to use RUE to position LUE, then to use RUE to pull on rail to complete roll to R, assist to trunk to complete elevation and then at hips to scoot/pivot on bed pad to reach EOB. intermittent assist to manage sitting balance. assist to control trunk cue for hip bridging for repositioning, and pt able to use RUE to scoot up in bed    Transfers                   General transfer comment: pt declined due to pain anf fatigue after sitting EOB       Balance Overall balance assessment: Needs assistance Sitting-balance support: Single extremity supported, Feet supported Sitting balance-[name]: Poor Sitting balance - Comments: initially min-modA, pt falling back, progressed  to CGA Postural control: Posterior lean                                  Cognition Arousal: Alert Behavior During Therapy: Flat affect Overall Cognitive  Status: Impaired/Different from baseline Area of Impairment: Attention, Memory, Following commands, Problem solving                   Current Attention Level: Selective Memory: Decreased short-term memory   Safety/Judgement: Decreased awareness of deficits Awareness: Intellectual Problem Solving: Slow processing, Difficulty sequencing General Comments: pt benefits from increased time, not able to        Exercises General Exercises - Lower Extremity Ankle Circles/Pumps: AROM, Right, 10 reps Long Arc Quad: Strengthening, Right, 5 reps, Seated (against min resistance. pt declined further reps) Hip ABduction/ADduction: AROM, Right, 10 reps, Supine    General Comments General comments (skin integrity, edema, etc.): pt concerned about fitting and position of L prosthetic now that he has L femoral HD cath      Pertinent Vitals/Pain Pain Assessment Faces Pain Scale: Hurts even more Pain Location: buttock and wounds Pain Descriptors / Indicators: Sore, Discomfort Pain Intervention(s): Monitored during session, Repositioned, RN gave pain meds during session     PT Goals (current goals can now be found in the care plan section) Acute Rehab PT Goals Patient Stated Goal: rehab at SNF PT Goal Formulation: With patient Time For Goal Achievement: 11/09/23 Potential to Achieve Goals: Fair Progress towards PT goals: Progressing toward goals    Frequency    Min 1X/week       AM-PAC PT "6 Clicks" Mobility   Outcome Measure  Help needed turning from your back to your side while in a flat bed without using bedrails?: A Lot Help needed moving from lying on your back to sitting on the side of a flat bed without using bedrails?: Total Help needed moving to and from a bed to a chair (including a wheelchair)?: Total Help needed standing up from a chair using your arms (e.g., wheelchair or bedside chair)?: Total Help needed to walk in hospital room?: Total Help needed climbing 3-5  steps with a railing? : Total 6 Click Score: 7    End of Session Equipment Utilized During Treatment: Other (comment) (bed pad) Activity Tolerance: Patient tolerated treatment well Patient left: in bed;with call bell/phone within reach;with bed alarm set Nurse Communication: Mobility status;Need for lift equipment PT Visit Diagnosis: Other abnormalities of gait and mobility (R26.89);Other symptoms and signs involving the nervous system (R29.898);Hemiplegia and hemiparesis Hemiplegia - Right/Left: Left Hemiplegia - dominant/non-dominant: Non-dominant Hemiplegia - caused by: Cerebral infarction     Time: 1610-9604 PT Time Calculation (min) (ACUTE ONLY): 37 min  Charges:    $Therapeutic Exercise: 8-22 mins $Therapeutic Activity: 8-22 mins PT General Charges $$ ACUTE PT VISIT: 1 Visit                     Vickki Muff, PT, DPT   Acute Rehabilitation Department Office [phone] Secure Chat Communication Preferred    Ronnie Derby 10/29/2023, 3:53 PM

## 2023-10-30 LAB — CBC
HCT: 28.6 % — ABNORMAL LOW (ref 39.0–52.0)
Hemoglobin: 8.4 g/dL — ABNORMAL LOW (ref 13.0–17.0)
MCH: 27.7 pg (ref 26.0–34.0)
MCHC: 29.4 g/dL — ABNORMAL LOW (ref 30.0–36.0)
MCV: 94.4 fL (ref 80.0–100.0)
Platelets: 155 10*3/uL (ref 150–400)
RBC: 3.03 MIL/uL — ABNORMAL LOW (ref 4.22–5.81)
RDW: 16.1 % — ABNORMAL HIGH (ref 11.5–15.5)
WBC: 4.4 10*3/uL (ref 4.0–10.5)
nRBC: 0 % (ref 0.0–0.2)

## 2023-10-30 LAB — RENAL FUNCTION PANEL
Albumin: 3.5 g/dL (ref 3.5–5.0)
Anion gap: 12 (ref 5–15)
BUN: 38 mg/dL — ABNORMAL HIGH (ref 6–20)
CO2: 28 mmol/L (ref 22–32)
Calcium: 9.4 mg/dL (ref 8.9–10.3)
Chloride: 98 mmol/L (ref 98–111)
Creatinine, Ser: 5.94 mg/dL — ABNORMAL HIGH (ref 0.61–1.24)
GFR, Estimated: 11 mL/min — ABNORMAL LOW (ref >60)
Glucose, Bld: 109 mg/dL — ABNORMAL HIGH (ref 70–99)
Phosphorus: 6.8 mg/dL — ABNORMAL HIGH (ref 2.5–4.6)
Potassium: 4.9 mmol/L (ref 3.5–5.1)
Sodium: 138 mmol/L (ref 135–145)

## 2023-10-30 LAB — GLUCOSE, CAPILLARY
Glucose-Capillary: 84 mg/dL (ref 70–99)
Glucose-Capillary: 159 mg/dL — ABNORMAL HIGH (ref 70–99)
Glucose-Capillary: 309 mg/dL — ABNORMAL HIGH (ref 70–99)

## 2023-10-30 MED ORDER — ALTEPLASE 2 MG IJ SOLR: 2.0000 mg | Freq: Once | INTRAMUSCULAR | Status: DC | PRN

## 2023-10-30 MED ORDER — LIDOCAINE HCL (PF) 1 % IJ SOLN: 5.0000 mL | INTRAMUSCULAR | Status: DC | PRN

## 2023-10-30 MED ORDER — ANTICOAGULANT SODIUM CITRATE 4% (200MG/5ML) IV SOLN: 5.0000 mL | Status: DC | PRN

## 2023-10-30 MED ORDER — PENTAFLUOROPROP-TETRAFLUOROETH EX AERO: 1.0000 | INHALATION_SPRAY | CUTANEOUS | Status: DC | PRN

## 2023-10-30 MED ORDER — HEPARIN SODIUM (PORCINE) 1000 UNIT/ML DIALYSIS: 1000.0000 [IU] | INTRAMUSCULAR | Status: DC | PRN

## 2023-10-30 MED ORDER — HEPARIN SODIUM (PORCINE) 1000 UNIT/ML IJ SOLN: 4200.0000 [IU] | Freq: Once | INTRAMUSCULAR | Status: DC

## 2023-10-30 MED ORDER — LIDOCAINE-PRILOCAINE 2.5-2.5 % EX CREA: 1.0000 | TOPICAL_CREAM | CUTANEOUS | Status: DC | PRN

## 2023-10-30 MED ORDER — ALBUMIN HUMAN 25 % IV SOLN
25.0000 g | Freq: Once | INTRAVENOUS | Status: AC
Start: 2023-10-30 — End: 2023-10-30
  Administered 2023-10-30: 25 g via INTRAVENOUS

## 2023-10-30 MED ORDER — HEPARIN SODIUM (PORCINE) 1000 UNIT/ML IJ SOLN
4600.0000 [IU] | Freq: Once | INTRAMUSCULAR | Status: AC
  Administered 2023-10-30: 4600 [IU]
  Filled 2023-10-30: qty 5

## 2023-10-30 NOTE — Progress Notes (Signed)
Received patient in bed to unit.  Alert and oriented.  Informed consent signed and in chart.   TX duration: 2 hours and 55 minutes. Patient wanted to come off early.  Signed AMA paper work.  NP Manson Passey informed.  Patient tolerated well.  Transported back to the room  Alert, without acute distress.  Hand-off given to patient's nurse.   Access used: Left Femoral HD cath Access issues: none  Total UF removed: Medication(s) given: Tylenol, Dilaudid   10/30/23 1322  Vitals  Temp (!) 97.4 F (36.3 C)  Temp Source Oral  BP (!) 108/44  BP Location Right Leg  BP Method Automatic  Patient Position (if appropriate) Lying  Pulse Rate (!) 120  ECG Heart Rate (!) 118  Resp (!) 23  Oxygen Therapy  SpO2 100 %  O2 Device Room Air     Stacie Glaze LPN [facility]

## 2023-10-30 NOTE — Progress Notes (Signed)
PROGRESS NOTE    REAL CONA  WUX:[id number] DOB: 05-28-1981 DOA: 09/09/2023 PCP: [name], NP   Brief Narrative: [name] is a 43 y.o. 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  While at [facility] he underwent cardiac cath showing non obstructive disease, craniotomy with abscess drainage on 09/01/23,. He had subarachnoid hemorrhage while on IV heparin, right third toe distal necrosis,was seen by podiatry, but family wanted conservative management. Since his tertiary care needs have been completed, he was transferred back to [facility] for further management.  PTOT  recommending SNF and awaiting placement.   Assessment and Plan:  MRSA TV/MV endocarditis Positive MRSA blood cultures. Diagnosed on Transesophageal Echocardiogram. Patient managed on Vancomycin. At outside hospital, patient underwent a left heart catheterization which was significant for nonobstructive disease. Infectious disease recommendations to continue Vancomycin IV and patient has completed his 8 week treatment course.  Prostate fluid collection/abscess Urology consulted at outside hospital and per their assessment, fluid collection was secondary to dilated seminal vesicle with no surgical intervention recommended. Infectious disease with continued concern for possible abscess. Patient covered with vancomycin with recommendation for outpatient urology follow-up.  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 Patient evaluated by neurosurgery. Concern for abscess ruled out by neurosurgery  initially. No surgical intervention per neurosurgery. However, at outside hospital, patient was evaluated by neurosurgery who performed a craniotomy for abscess drainage.  ESRD on HD Nephrology consulted and managing hemodialysis.  Hyperkalemia Improved with HD.  Anemia of chronic kidney disease Hemoglobin stable.  CKD mineral bone disease -Continue calcitriol  Acute CVA MRI confirms large right MCA branch infarct. MRA head (11/25) significant for large necrotic appearing lesion within the right frontal lobe within area of the prior right MCA territory infarct with vasogenic edema and 8 mm leftward midline shift. LDL of 34. Hemoglobin A1C of 8.9%. Transthoracic Echocardiogram (10/13) significant for an LVEF of 25-30% with concern for AV/MV abnormality with Transesophageal Echocardiogram (10/16 & 10/22) significant for MV/TV vegetations. Neurology recommendations for Eliquis. PT/OT recommendations for SNF.  Primary hypertension Patient is on metoprolol as an outpatient. -Continue metoprolol  Acute left upper extremity DVT -Continue Eliquis  Right third toe necrosis Noted. Per documentation, patient/family opting for conservative management and decline surgical management.  Diabetes mellitus type 1 Poorly controlled with hyperglycemia and hypoglycemia. Hemoglobin A1C of 8.9%. Partly due to poor dietary choice. Before bedtime dosing discontinued. -Continue Semglee 8 units daily and SSI  Gastroparesis Noted. Patient is not on medication management apart from insulin for blood sugar control. Patient without symptoms and is tolerating his diet.  History of left BKA Noted. Patient seen by PT/OT with recommendation for SNF on discharge.  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 Patient started on Seroquel for behavioral change management.  Overweight Estimated body mass index is 33.63 kg/m as calculated from the following:   Height as of this encounter: 5\' 7"  (1.702 m).    Weight as of this encounter: 97.4 kg.  Pressure injury Left buttock. Not present on admission.   DVT prophylaxis: Eliquis Code Status:   Code Status: Full Code Family Communication: None  at bedside Disposition Plan: Discharge to SNF pending bed availability   Consultants:    Procedures:    Antimicrobials: Vancomycin Linezolid    Subjective: No concerns today.  Objective: BP 105/69 (BP Location: Right Arm)   Pulse (!) 109   Temp 97.6 F (36.4 C) (Oral)   Resp 19   Ht 5\' 7"  (1.702 m)   Wt 97.4 kg   SpO2 96%   BMI 33.63 kg/m   Examination:  General exam: Appears calm and comfortable. Naked in bed.   Data Reviewed: I have personally reviewed following labs and imaging studies  CBC Lab Results  Component Value Date   WBC 4.8 10/28/2023   RBC 3.10 (L) 10/28/2023   HGB 8.6 (L) 10/28/2023   HCT 29.2 (L) 10/28/2023   MCV 94.2 10/28/2023   MCH 27.7 10/28/2023   PLT 158 10/28/2023   MCHC 29.5 (L) 10/28/2023   RDW 16.0 (H) 10/28/2023   LYMPHSABS 1.2 10/17/2023   MONOABS 0.4 10/17/2023   EOSABS 0.7 (H) 10/17/2023   BASOSABS 0.1 10/17/2023     Last metabolic panel Lab Results  Component Value Date   NA 135 10/28/2023   K 4.8 10/28/2023   CL 97 (L) 10/28/2023   CO2 27 10/28/2023   BUN 32 (H) 10/28/2023   CREATININE 5.85 (H) 10/28/2023   GLUCOSE 228 (H) 10/28/2023   GFRNONAA 11 (L) 10/28/2023   GFRAA 7 (L) 05/30/2020   CALCIUM 8.9 10/28/2023   PHOS 7.5 (H) 10/26/2023   PROT 7.7 10/19/2023   PROT 7.8 10/19/2023   ALBUMIN 3.3 (L) 10/26/2023   BILITOT 1.3 (H) 10/19/2023   BILITOT 1.3 (H) 10/19/2023   ALKPHOS 150 (H) 10/19/2023   ALKPHOS 150 (H) 10/19/2023   AST 20 10/19/2023   AST 19 10/19/2023   ALT 10 10/19/2023   ALT 8 10/19/2023   ANIONGAP 11 10/28/2023    GFR: Estimated Creatinine Clearance: 17.9 mL/min (A) (by C-G formula based on SCr of 5.85 mg/dL (H)).  No results found for this or any previous visit (from the past 240 hours).     Radiology Studies: No results found.    LOS: 51 days    Jacquelin Hawking, MD [facility] 10/30/2023, 7:46 AM   If 7PM-7AM, please contact night-coverage [email or web address]

## 2023-10-30 NOTE — Plan of Care (Signed)
  Problem: Health Behavior/Discharge Planning: Goal: Ability to manage health-related needs will improve Outcome: Progressing   Problem: Clinical Measurements: Goal: Ability to maintain clinical measurements within normal limits will improve Outcome: Progressing Goal: Will remain free from infection Outcome: Progressing Goal: Diagnostic test results will improve Outcome: Progressing Goal: Cardiovascular complication will be avoided Outcome: Progressing   Problem: Activity: Goal: Risk for activity intolerance will decrease Outcome: Progressing   Problem: Coping: Goal: Level of anxiety will decrease Outcome: Progressing   Problem: Skin Integrity: Goal: Risk for impaired skin integrity will decrease Outcome: Progressing   Problem: Education: Goal: Ability to describe self-care measures that may prevent or decrease complications (Diabetes Survival Skills Education) will improve Outcome: Progressing Goal: Individualized Educational Video(s) Outcome: Progressing   Problem: Coping: Goal: Ability to adjust to condition or change in health will improve Outcome: Progressing   Problem: Fluid Volume: Goal: Ability to maintain a balanced intake and output will improve Outcome: Progressing   Problem: Health Behavior/Discharge Planning: Goal: Ability to identify and utilize available resources and services will improve Outcome: Progressing Goal: Ability to manage health-related needs will improve Outcome: Progressing   Problem: Metabolic: Goal: Ability to maintain appropriate glucose levels will improve Outcome: Progressing   Problem: Skin Integrity: Goal: Risk for impaired skin integrity will decrease Outcome: Progressing   Problem: Tissue Perfusion: Goal: Adequacy of tissue perfusion will improve Outcome: Progressing   Problem: Education: Goal: Knowledge of disease and its progression will improve Outcome: Progressing Goal: Individualized Educational Video(s) Outcome:  Progressing   Problem: Fluid Volume: Goal: Compliance with measures to maintain balanced fluid volume will improve Outcome: Progressing   Problem: Health Behavior/Discharge Planning: Goal: Ability to manage health-related needs will improve Outcome: Progressing   Problem: Nutritional: Goal: Ability to make healthy dietary choices will improve Outcome: Progressing   Problem: Clinical Measurements: Goal: Complications related to the disease process, condition or treatment will be avoided or minimized Outcome: Progressing

## 2023-10-30 NOTE — Progress Notes (Signed)
   10/30/23 1327  Vitals  BP 114/86  MAP (mmHg) 95  Pulse Rate (!) 117  ECG Heart Rate (!) 118  Resp 15  Oxygen Therapy  SpO2 96 %  During Treatment Monitoring  Duration of HD Treatment -hour(s) 2.92 hour(s)  HD Safety Checks Performed Yes  Intra-Hemodialysis Comments See progress note (Patient Terminated  session.  AMA paperwork signed.  NP Manson Passey informed)  Post Treatment  Dialyzer Clearance Clear  Liters Processed 70.1  Fluid Removed (mL) 800 mL  Tolerated HD Treatment Yes  Post-Hemodialysis Comments Patient Terminated session. AMA paperwork signed. NP Manson Passey informed  Hemodialysis Catheter Left Femoral vein Double lumen Permanent (Tunneled)  Placement Date/Time: 07/16/23 1318   Serial / Lot #: [id number]  Expiration Date: 05/24/26  Time Out: Correct patient;Correct site;Correct procedure  Maximum sterile barrier precautions: Hand hygiene;Cap;Mask;Sterile gown;Sterile gloves;Large sterile ...  Site Condition No complications  Blue Lumen Status Flushed;Heparin locked;Dead end cap in place  Red Lumen Status Flushed;Heparin locked;Dead end cap in place  Purple Lumen Status N/A  Catheter fill solution Heparin 1000 units/ml  Catheter fill volume (Arterial) 2.3 cc  Catheter fill volume (Venous) 2.3  Dressing Type Transparent  Dressing Status Antimicrobial disc/dressing in place;Clean, Dry, Intact  Interventions Other (Comment) (deaccessed)  Drainage Description None  Dressing Change Due 11/03/23  Post treatment catheter status Capped and Clamped

## 2023-10-31 LAB — GLUCOSE, CAPILLARY
Glucose-Capillary: 174 mg/dL — ABNORMAL HIGH (ref 70–99)
Glucose-Capillary: 112 mg/dL — ABNORMAL HIGH (ref 70–99)
Glucose-Capillary: 107 mg/dL — ABNORMAL HIGH (ref 70–99)
Glucose-Capillary: 110 mg/dL — ABNORMAL HIGH (ref 70–99)

## 2023-10-31 NOTE — Progress Notes (Signed)
Patient repeatedly asking for multiple narcotics at the same time; for example, he would ask for dilaudid, oxycodone, klonopin and tylenol all at once.This nurse constantly declined to give multiple medications together and attempted to educate patient on why.The patient became rude and resorted to name calling. Patient declines oxycodone and alternative pain medicine when dilaudid is not due.

## 2023-10-31 NOTE — Progress Notes (Signed)
PROGRESS NOTE    [name]  ZOX:[id number] DOB: 04-05-1981 DOA: 09/09/2023 PCP: Shane Sessions, NP   Brief Narrative: [name] is a 43 y.o. 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  While at [facility] he underwent cardiac cath showing non obstructive disease, craniotomy with abscess drainage on 09/01/23,. He had subarachnoid hemorrhage while on IV heparin, right third toe distal necrosis,was seen by podiatry, but family wanted conservative management. Since his tertiary care needs have been completed, he was transferred back to [facility] for further management.  PTOT  recommending SNF and awaiting placement.   Assessment and Plan:  MRSA TV/MV endocarditis Positive MRSA blood cultures. Diagnosed on Transesophageal Echocardiogram. Patient managed on Vancomycin. At outside hospital, patient underwent a left heart catheterization which was significant for nonobstructive disease. Infectious disease recommendations to continue Vancomycin IV and patient has completed his 8 week treatment course.  Prostate fluid collection/abscess Urology consulted at outside hospital and per their assessment, fluid collection was secondary to dilated seminal vesicle with no surgical intervention recommended. Infectious disease with continued concern for possible abscess. Patient covered with vancomycin with recommendation for outpatient urology follow-up.  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 Patient evaluated by neurosurgery. Concern for abscess ruled out by neurosurgery  initially. No surgical intervention per neurosurgery. However, at outside hospital, patient was evaluated by neurosurgery who performed a craniotomy for abscess drainage.  ESRD on HD Nephrology consulted and managing hemodialysis.  Hyperkalemia Improved with HD.  Anemia of chronic kidney disease Hemoglobin stable.  CKD mineral bone disease -Continue calcitriol  Acute CVA MRI confirms large right MCA branch infarct. MRA head (11/25) significant for large necrotic appearing lesion within the right frontal lobe within area of the prior right MCA territory infarct with vasogenic edema and 8 mm leftward midline shift. LDL of 34. Hemoglobin A1C of 8.9%. Transthoracic Echocardiogram (10/13) significant for an LVEF of 25-30% with concern for AV/MV abnormality with Transesophageal Echocardiogram (10/16 & 10/22) significant for MV/TV vegetations. Neurology recommendations for Eliquis. PT/OT recommendations for SNF.  Primary hypertension Patient is on metoprolol as an outpatient. -Continue metoprolol  Acute left upper extremity DVT -Continue Eliquis  Right third toe necrosis Noted. Per documentation, patient/family opting for conservative management and decline surgical management.  Diabetes mellitus type 1 Poorly controlled with hyperglycemia and hypoglycemia. Hemoglobin A1C of 8.9%. Partly due to continued poor dietary choices. -Continue Semglee 8 units daily and SSI  Gastroparesis Noted. Patient is not on medication management apart from insulin for blood sugar control. Patient without symptoms and is tolerating his diet.  History of left BKA Noted. Patient seen by PT/OT with recommendation for SNF on discharge.  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 Patient started on Seroquel for behavioral change management.  Overweight Estimated body mass index is 33.25 kg/m as calculated from the following:   Height as of this encounter: 5\' 7"  (1.702 m).   Weight as of this  encounter: 96.3 kg.  Pressure injury Left buttock. Not present on admission.   DVT prophylaxis: Eliquis Code Status:   Code Status: Full Code Family Communication: None at bedside Disposition  Plan: Discharge to SNF pending bed availability   Consultants:  Nephrology  Procedures:  Hemodialysis  Antimicrobials: Vancomycin Linezolid    Subjective: No specific medical concerns.  Objective: BP (!) 117/59   Pulse (!) 105   Temp 97.9 F (36.6 C)   Resp 18   Ht 5\' 7"  (1.702 m)   Wt 96.3 kg   SpO2 100%   BMI 33.25 kg/m   Examination:  General exam: Appears calm and comfortable   Data Reviewed: I have personally reviewed following labs and imaging studies  CBC Lab Results  Component Value Date   WBC 4.4 10/30/2023   RBC 3.03 (L) 10/30/2023   HGB 8.4 (L) 10/30/2023   HCT 28.6 (L) 10/30/2023   MCV 94.4 10/30/2023   MCH 27.7 10/30/2023   PLT 155 10/30/2023   MCHC 29.4 (L) 10/30/2023   RDW 16.1 (H) 10/30/2023   LYMPHSABS 1.2 10/17/2023   MONOABS 0.4 10/17/2023   EOSABS 0.7 (H) 10/17/2023   BASOSABS 0.1 10/17/2023     Last metabolic panel Lab Results  Component Value Date   NA 138 10/30/2023   K 4.9 10/30/2023   CL 98 10/30/2023   CO2 28 10/30/2023   BUN 38 (H) 10/30/2023   CREATININE 5.94 (H) 10/30/2023   GLUCOSE 109 (H) 10/30/2023   GFRNONAA 11 (L) 10/30/2023   GFRAA 7 (L) 05/30/2020   CALCIUM 9.4 10/30/2023   PHOS 6.8 (H) 10/30/2023   PROT 7.7 10/19/2023   PROT 7.8 10/19/2023   ALBUMIN 3.5 10/30/2023   BILITOT 1.3 (H) 10/19/2023   BILITOT 1.3 (H) 10/19/2023   ALKPHOS 150 (H) 10/19/2023   ALKPHOS 150 (H) 10/19/2023   AST 20 10/19/2023   AST 19 10/19/2023   ALT 10 10/19/2023   ALT 8 10/19/2023   ANIONGAP 12 10/30/2023    GFR: Estimated Creatinine Clearance: 17.6 mL/min (A) (by C-G formula based on SCr of 5.94 mg/dL (H)).  No results found for this or any previous visit (from the past 240 hours).    Radiology Studies: No results  found.    LOS: 52 days    Shane Hawking, MD [facility] 10/31/2023, 8:32 AM   If 7PM-7AM, please contact night-coverage [email or web address]

## 2023-11-01 LAB — CBC
HCT: 33 % — ABNORMAL LOW (ref 39.0–52.0)
Hemoglobin: 9.5 g/dL — ABNORMAL LOW (ref 13.0–17.0)
MCH: 27.5 pg (ref 26.0–34.0)
MCHC: 28.8 g/dL — ABNORMAL LOW (ref 30.0–36.0)
MCV: 95.7 fL (ref 80.0–100.0)
Platelets: 141 10*3/uL — ABNORMAL LOW (ref 150–400)
RBC: 3.45 MIL/uL — ABNORMAL LOW (ref 4.22–5.81)
RDW: 16.1 % — ABNORMAL HIGH (ref 11.5–15.5)
WBC: 5.8 10*3/uL (ref 4.0–10.5)
nRBC: 0 % (ref 0.0–0.2)

## 2023-11-01 LAB — GLUCOSE, CAPILLARY
Glucose-Capillary: 57 mg/dL — ABNORMAL LOW (ref 70–99)
Glucose-Capillary: 42 mg/dL — CL (ref 70–99)
Glucose-Capillary: 47 mg/dL — ABNORMAL LOW (ref 70–99)
Glucose-Capillary: 86 mg/dL (ref 70–99)
Glucose-Capillary: 195 mg/dL — ABNORMAL HIGH (ref 70–99)
Glucose-Capillary: 305 mg/dL — ABNORMAL HIGH (ref 70–99)

## 2023-11-01 LAB — RENAL FUNCTION PANEL
Albumin: 3.5 g/dL (ref 3.5–5.0)
Anion gap: 17 — ABNORMAL HIGH (ref 5–15)
BUN: 46 mg/dL — ABNORMAL HIGH (ref 6–20)
CO2: 24 mmol/L (ref 22–32)
Calcium: 9 mg/dL (ref 8.9–10.3)
Chloride: 96 mmol/L — ABNORMAL LOW (ref 98–111)
Creatinine, Ser: 6.3 mg/dL — ABNORMAL HIGH (ref 0.61–1.24)
GFR, Estimated: 10 mL/min — ABNORMAL LOW (ref >60)
Glucose, Bld: 325 mg/dL — ABNORMAL HIGH (ref 70–99)
Phosphorus: 7.9 mg/dL — ABNORMAL HIGH (ref 2.5–4.6)
Potassium: 5.5 mmol/L — ABNORMAL HIGH (ref 3.5–5.1)
Sodium: 137 mmol/L (ref 135–145)

## 2023-11-01 MED ORDER — PENTAFLUOROPROP-TETRAFLUOROETH EX AERO
1.0000 | INHALATION_SPRAY | CUTANEOUS | Status: DC | PRN
Start: 2023-11-01 — End: 2023-11-01

## 2023-11-01 MED ORDER — GLUCOSE 4 G PO CHEW
1.0000 | CHEWABLE_TABLET | Freq: Once | ORAL | Status: DC
Start: 2023-11-01 — End: 2023-11-01

## 2023-11-01 MED ORDER — ALTEPLASE 2 MG IJ SOLR: 2.0000 mg | Freq: Once | INTRAMUSCULAR | Status: DC | PRN

## 2023-11-01 MED ORDER — INSULIN GLARGINE-YFGN 100 UNIT/ML ~~LOC~~ SOLN
4.0000 [IU] | Freq: Every day | SUBCUTANEOUS | Status: DC
  Administered 2023-11-01 – 2023-11-11 (×11): 4 [IU] via SUBCUTANEOUS
  Filled 2023-11-01 (×11): qty 0.04

## 2023-11-01 MED ORDER — ANTICOAGULANT SODIUM CITRATE 4% (200MG/5ML) IV SOLN: 5.0000 mL | Status: DC | PRN

## 2023-11-01 MED ORDER — LIDOCAINE HCL (PF) 1 % IJ SOLN
5.0000 mL | INTRAMUSCULAR | Status: DC | PRN
Start: 2023-11-01 — End: 2023-11-01

## 2023-11-01 MED ORDER — GLUCOSE 40 % PO GEL: 2.0000 | ORAL | Status: AC

## 2023-11-01 MED ORDER — HEPARIN SODIUM (PORCINE) 1000 UNIT/ML DIALYSIS: 1000.0000 [IU] | INTRAMUSCULAR | Status: DC | PRN

## 2023-11-01 MED ORDER — LIDOCAINE HCL (PF) 1 % IJ SOLN: 5.0000 mL | INTRAMUSCULAR | Status: DC | PRN

## 2023-11-01 MED ORDER — ANTICOAGULANT SODIUM CITRATE 4% (200MG/5ML) IV SOLN
5.0000 mL | Status: DC | PRN
Start: 2023-11-01 — End: 2023-11-01

## 2023-11-01 MED ORDER — LIDOCAINE-PRILOCAINE 2.5-2.5 % EX CREA: 1.0000 | TOPICAL_CREAM | CUTANEOUS | Status: DC | PRN

## 2023-11-01 MED ORDER — LIDOCAINE-PRILOCAINE 2.5-2.5 % EX CREA
1.0000 | TOPICAL_CREAM | CUTANEOUS | Status: DC | PRN
Start: 2023-11-01 — End: 2023-11-01

## 2023-11-01 MED ORDER — PENTAFLUOROPROP-TETRAFLUOROETH EX AERO: 1.0000 | INHALATION_SPRAY | CUTANEOUS | Status: DC | PRN

## 2023-11-01 MED ORDER — CHLORHEXIDINE GLUCONATE CLOTH 2 % EX PADS
6.0000 | MEDICATED_PAD | Freq: Every day | CUTANEOUS | Status: DC
  Administered 2023-11-02 – 2023-11-03 (×2): 6 via TOPICAL

## 2023-11-01 NOTE — TOC Progression Note (Signed)
Transition of Care [facility]) - Progression Note    Patient Details  Name: [name] MRN: [id number] Date of Birth: [birth year]  Transition [facility]) CM/SW Contact  Erin Sons, [name] Phone Number: 11/01/2023, 11:13 AM  Clinical Narrative:     OPHD clinic that [facility] can accomodate has not been able to be secured.   CSW spoke with [facility]; she will visit pt today to meet/assess further.   CSW met with pt and notified him of visit. Pt expressed optimism and motivation to maintain a positive attitude and focus on his goal of completing rehab and eventually returning home.   Expected Discharge Plan: Skilled Nursing Facility Barriers to Discharge: Continued Medical Work up                      Social Determinants of Health (SDOH) Interventions SDOH Screenings   Food Insecurity: No Food Insecurity (09/09/2023)  Housing: Unknown (09/09/2023)  Transportation Needs: No Transportation Needs (09/09/2023)  Utilities: Not At Risk (09/09/2023)  Alcohol Screen: Low Risk  (02/15/2023)  Depression (PHQ2-9): Low Risk  (02/15/2023)  Financial Resource Strain: Low Risk  (02/15/2023)  Physical Activity: Inactive (02/15/2023)  Social Connections: Socially Integrated (02/15/2023)  Stress: No Stress Concern Present (02/15/2023)  Tobacco Use: Low Risk  (09/12/2023)  Recent Concern: Tobacco Use - Medium Risk (09/01/2023)   Received from [facility]    Readmission Risk Interventions     No data to display

## 2023-11-01 NOTE — Progress Notes (Signed)
PT Cancellation Note  Patient Details Name: [name] MRN: [id number] DOB: [birth year]   Cancelled Treatment:    Reason Eval/Treat Not Completed: Patient declined, no reason specified, pt reports he is using bedpan and would like privacy until he is finished, asked PT to return another time. PT will continue to follow and progress OOB mobility as time/schedule allows.   Vickki Muff, PT, DPT   Acute Rehabilitation Department Office [phone] Secure Chat Communication Preferred   [name] 11/01/2023, 2:15 PM

## 2023-11-01 NOTE — Progress Notes (Signed)
Hypoglycemic Event  CBG: 57  Treatment: Patient refuses juice-wants milk instead. Patient was already eating his breakfast when RN entered room to do assessment this morning and noted that that his latest blood sugar was 57.   Symptoms: Patient conversing with staff. No symptoms endorsed by patient at this time.  Follow-up CBG: Time: 0815 CBG Result: 47, 42 in different sites of right hand (left arm extremity restricted).  Possible Reasons for Event: possibly due to poor profusion to extremity/right arm is cool to touch, medication regimen. Attempted to warm fingers prior to finger-stick.  Comments/MD notified:Dr. Mal Misty notified. New orders: blood work to be done and finger-stick to be coordinated with blood draw.  [place]. Patient refusing blood work by phlebotomist and refusing further finger sticks. Patient refuses glucose gel for treatment. Offered juice again which patient refused. Patient only requesting for only for dextrose IV-says it is the only thing that works. Patient ate most of his breakfast this morning. RN educated patient to allow staff to check accuracy of blood sugar, a blood work is needed. Patient still refusing; patient wanted RN to call his mom to get for permission and to let her know the situation regarding the blood sugars. RN called patient's mom to update her and to get permission from patient's mom. Patient's mom stated, it is up to the patient's decision regarding this. RN updated patient.   [name]

## 2023-11-01 NOTE — Progress Notes (Signed)
[facility] Progress Note   Subjective:    Seen and examined patient at bedside. No acute issues at this time. The administrator from [employer] at bedside speaking with the patient. Next HD 2/4.  Objective Vitals:   10/31/23 2102 11/01/23 0556 11/01/23 0733 11/01/23 1152  BP: (!) 108/55 124/64 97/81 (!) 131/38  Pulse: (!) 105 (!) 105 95 (!) 117  Resp: 18 18 17 16   Temp: 98.1 F (36.7 C) 98.6 F (37 C) 98.1 F (36.7 C) (!) 97.3 F (36.3 C)  TempSrc:   Oral Oral  SpO2: 94% 97% 98% 93%  Weight:      Height:       Physical Exam General: Chronically ill appearing male in NAD Heart: S1,S2 RRR No M/R/G Lungs: Few coarse upper airway rhonchi which clear with cough, otherwise CTAB. No WOB Abdomen:NABS, NT Extremities: L AKA no stump edema, no RLE edema Dialysis Access: L femoral TDC Drsg intact  Filed Weights   10/28/23 0943 10/30/23 0926 10/30/23 1334  Weight: 97.4 kg 97.1 kg 96.3 kg    Intake/Output Summary (Last 24 hours) at 11/01/2023 1536 Last data filed at 11/01/2023 1300 Gross per 24 hour  Intake 786 ml  Output 0 ml  Net 786 ml    Additional Objective Labs: Basic Metabolic Panel: Recent Labs  Lab 10/26/23 1100 10/28/23 0356 10/30/23 0927  NA 137 135 138  K 5.2* 4.8 4.9  CL 97* 97* 98  CO2 29 27 28   GLUCOSE 147* 228* 109*  BUN 42* 32* 38*  CREATININE 7.43* 5.85* 5.94*  CALCIUM 9.3 8.9 9.4  PHOS 7.5*  --  6.8*   Liver Function Tests: Recent Labs  Lab 10/26/23 1100 10/30/23 0927  ALBUMIN 3.3* 3.5   No results for input(s): "LIPASE", "AMYLASE" in the last 168 hours. CBC: Recent Labs  Lab 10/26/23 1100 10/28/23 0356 10/30/23 0927  WBC 5.0 4.8 4.4  HGB 8.4* 8.6* 8.4*  HCT 28.9* 29.2* 28.6*  MCV 94.4 94.2 94.4  PLT 163 158 155   Blood Culture    Component Value Date/Time   SDES BLOOD SITE NOT SPECIFIED 08/25/2023 1706   SPECREQUEST  08/25/2023 1706    BOTTLES DRAWN AEROBIC ONLY Blood Culture results may not be optimal due to  an inadequate volume of blood received in culture bottles   CULT  08/25/2023 1706    NO GROWTH 5 DAYS Performed at [facility], [address] [place], [name] [address]    REPTSTATUS 08/30/2023 FINAL 08/25/2023 1706    Cardiac Enzymes: No results for input(s): "CKTOTAL", "CKMB", "CKMBINDEX", "TROPONINI" in the last 168 hours. CBG: Recent Labs  Lab 10/31/23 2104 11/01/23 0727 11/01/23 0815 11/01/23 0820 11/01/23 1038  GLUCAP 110* 57* 42* 47* 86   Iron Studies: No results for input(s): "IRON", "TIBC", "TRANSFERRIN", "FERRITIN" in the last 72 hours. Lab Results  Component Value Date   INR 1.2 07/22/2020   INR 1.0 05/30/2020   INR 1.2 04/24/2019   Studies/Results: No results found.  Medications:  anticoagulant sodium citrate      (feeding supplement) PROSource Plus  30 mL Oral BID BM   apixaban  5 mg Oral BID   bacitracin   Topical Daily   Chlorhexidine Gluconate Cloth  6 each Topical Q0600   darbepoetin (ARANESP) injection - DIALYSIS  100 mcg Subcutaneous Q Sun-1800   dextrose  2 Tube Oral STAT   gabapentin  100 mg Oral TID   Gerhardt's butt cream   Topical  BID   insulin aspart  0-6 Units Subcutaneous TID WC   insulin glargine-yfgn  4 Units Subcutaneous Daily   loratadine  10 mg Oral Daily   midodrine  10 mg Oral Q T,Th,Sa-HD   pantoprazole  40 mg Oral Daily   povidone-Iodine   Topical Q0600   QUEtiapine  12.5 mg Oral q morning   QUEtiapine  25 mg Oral QHS   senna-docusate  2 tablet Oral BID   sevelamer carbonate  2,400 mg Oral TID WC   sodium zirconium cyclosilicate  10 g Oral TID    Dialysis Orders: MWF - [facility] -> may be will be going to [place] [facility] on discharge 4hr, 400/A1.5, EDW 77kg, L femoral TDC, 2K/3Ca bath, no heparin  Assessment/Plan: MRSA bacteremia with MV/TV endocarditis + brain abscess:  Not a candidate for valve surgery, S/p line holiday and brain abscess drainage (at [facility]). Continues on extended Vanc course - will be on Vanc  IV q HD until 10/27/23. S/p transfer to [facility] and drainage 08/31/24. Repeat brain MRI 10/17/23 showed improvement. Acute B CVA/septic emboli- due to above Acute LUE DVT: On Eliquis ESRD: Transitioned to TTS. Next HD 11/02/2023 HTN/volume: BP stable, pre hd  drop with uf  today  give albumin , CT 10/09/23 with diffuse anasarca. Significantly above EDW. But bed wts  Max UF with HD. Continue to try to enforce fluid restrictions/compliance with full HD time Anemia of ESRD: Hgb 8.4<10.1, ??if dilutional , fu trend Aranesp to 150 mcg q Sunday while here. Last 1/19.  No IV iron while on abx. Secondary HPTH: CorrCa/Phos high. Continue to hold calcitriol. Sevelamer increased to 3/meals.  Continues to have outside food and refuses any diet except regular (chronic problem with him despite counseling) Hyperkalemia: had been persistently high despite Lokelma 10g TID, using low bath intermittently with HD.  As noted above diet indiscretion despite counseling Nutrition: Alb 3.3  continue supplements.  T1DM: per primary Dispo: SNF being arranged - not finalized yet  Salome Holmes, NP [facility] 11/01/2023,3:36 PM  LOS: 53 days

## 2023-11-01 NOTE — Plan of Care (Signed)
  Problem: Clinical Measurements: Goal: Ability to maintain clinical measurements within normal limits will improve Outcome: Progressing Goal: Will remain free from infection Outcome: Progressing Goal: Diagnostic test results will improve Outcome: Progressing Goal: Cardiovascular complication will be avoided Outcome: Progressing   Problem: Activity: Goal: Risk for activity intolerance will decrease Outcome: Progressing   Problem: Coping: Goal: Level of anxiety will decrease Outcome: Progressing   Problem: Skin Integrity: Goal: Risk for impaired skin integrity will decrease Outcome: Progressing   Problem: Education: Goal: Ability to describe self-care measures that may prevent or decrease complications (Diabetes Survival Skills Education) will improve Outcome: Progressing Goal: Individualized Educational Video(s) Outcome: Progressing

## 2023-11-01 NOTE — Progress Notes (Signed)
Pt's case discussed with CSW this am. Will assist as needed.   Olivia Canter Renal Navigator [phone]

## 2023-11-01 NOTE — Progress Notes (Signed)
PROGRESS NOTE    [name]  VWU:[id number] DOB: Mar 05, 1981 DOA: 09/09/2023 PCP: [name], NP   Brief Narrative: [name] is a 43 y.o. 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  While at [facility] he underwent cardiac cath showing non obstructive disease, craniotomy with abscess drainage on 09/01/23,. He had subarachnoid hemorrhage while on IV heparin, right third toe distal necrosis,was seen by podiatry, but family wanted conservative management. Since his tertiary care needs have been completed, he was transferred back to [facility] for further management.  PTOT  recommending SNF and awaiting placement.   Assessment and Plan:  MRSA TV/MV endocarditis Positive MRSA blood cultures. Diagnosed on Transesophageal Echocardiogram. Patient managed on Vancomycin. At outside hospital, patient underwent a left heart catheterization which was significant for nonobstructive disease. Infectious disease recommendations to continue Vancomycin IV and patient has completed his 8 week treatment course.  Prostate fluid collection/abscess Urology consulted at outside hospital and per their assessment, fluid collection was secondary to dilated seminal vesicle with no surgical intervention recommended. Infectious disease with continued concern for possible abscess. Patient covered with vancomycin with recommendation for outpatient urology follow-up.  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 Patient evaluated by neurosurgery. Concern for abscess ruled out by neurosurgery  initially. No surgical intervention per neurosurgery. However, at outside hospital, patient was evaluated by neurosurgery who performed a craniotomy for abscess drainage.  ESRD on HD Nephrology consulted and managing hemodialysis.  Hyperkalemia Improved with HD.  Anemia of chronic kidney disease Hemoglobin stable.  CKD mineral bone disease -Continue calcitriol  Acute CVA MRI confirms large right MCA branch infarct. MRA head (11/25) significant for large necrotic appearing lesion within the right frontal lobe within area of the prior right MCA territory infarct with vasogenic edema and 8 mm leftward midline shift. LDL of 34. Hemoglobin A1C of 8.9%. Transthoracic Echocardiogram (10/13) significant for an LVEF of 25-30% with concern for AV/MV abnormality with Transesophageal Echocardiogram (10/16 & 10/22) significant for MV/TV vegetations. Neurology recommendations for Eliquis. PT/OT recommendations for SNF.  Primary hypertension Patient is on metoprolol as an outpatient. -Continue metoprolol  Acute left upper extremity DVT -Continue Eliquis  Right third toe necrosis Noted. Per documentation, patient/family opting for conservative management and decline surgical management.  Diabetes mellitus type 1 Poorly controlled with hyperglycemia and hypoglycemia. Hemoglobin A1C of 8.9%. Seems mostly due to continued poor dietary choices. Partly iatrogenic secondary to attempting to adjust regimen for dietary indiscretions. -Decrease to Semglee 4 units daily and continue SSI; discontinue nighttime sliding scale  Gastroparesis Noted. Patient is not on medication management apart from insulin for blood sugar control. Patient without symptoms and is tolerating his diet.  History of left BKA Noted. Patient seen by PT/OT with recommendation for SNF on discharge.  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 Patient started on Seroquel for behavioral change management.  Overweight Estimated  body mass index is 33.25 kg/m as calculated from the following:   Height as of this encounter: 5\' 7"  (1.702 m).   Weight as of this encounter: 96.3 kg.  Pressure injury Left buttock. Not present on admission.  DVT prophylaxis: Eliquis Code Status:   Code Status: Full Code Family Communication: None at bedside Disposition Plan: Discharge to SNF pending bed availability   Consultants:  Nephrology  Procedures:  Hemodialysis  Antimicrobials: Vancomycin Linezolid    Subjective: Patient reports wanting dextrose IV. He refuses to use oral modalities to manage hypoglycemia.  Objective: BP 97/81 (BP Location: Right Leg)   Pulse 95   Temp 98.1 F (36.7 C) (Oral)   Resp 17   Ht 5\' 7"  (1.702 m)   Wt 96.3 kg   SpO2 98%   BMI 33.25 kg/m   Examination:  General exam: no distress   Data Reviewed: I have personally reviewed following labs and imaging studies  CBC Lab Results  Component Value Date   WBC 4.4 10/30/2023   RBC 3.03 (L) 10/30/2023   HGB 8.4 (L) 10/30/2023   HCT 28.6 (L) 10/30/2023   MCV 94.4 10/30/2023   MCH 27.7 10/30/2023   PLT 155 10/30/2023   MCHC 29.4 (L) 10/30/2023   RDW 16.1 (H) 10/30/2023   LYMPHSABS 1.2 10/17/2023   MONOABS 0.4 10/17/2023   EOSABS 0.7 (H) 10/17/2023   BASOSABS 0.1 10/17/2023     Last metabolic panel Lab Results  Component Value Date   NA 138 10/30/2023   K 4.9 10/30/2023   CL 98 10/30/2023   CO2 28 10/30/2023   BUN 38 (H) 10/30/2023   CREATININE 5.94 (H) 10/30/2023   GLUCOSE 109 (H) 10/30/2023   GFRNONAA 11 (L) 10/30/2023   GFRAA 7 (L) 05/30/2020   CALCIUM 9.4 10/30/2023   PHOS 6.8 (H) 10/30/2023   PROT 7.7 10/19/2023   PROT 7.8 10/19/2023   ALBUMIN 3.5 10/30/2023   BILITOT 1.3 (H) 10/19/2023   BILITOT 1.3 (H) 10/19/2023   ALKPHOS 150 (H) 10/19/2023   ALKPHOS 150 (H) 10/19/2023   AST 20 10/19/2023   AST 19 10/19/2023   ALT 10 10/19/2023   ALT 8 10/19/2023   ANIONGAP 12 10/30/2023    GFR: Estimated  Creatinine Clearance: 17.6 mL/min (A) (by C-G formula based on SCr of 5.94 mg/dL (H)).  No results found for this or any previous visit (from the past 240 hours).    Radiology Studies: No results found.    LOS: 53 days    Jacquelin Hawking, MD [facility] 11/01/2023, 8:49 AM   If 7PM-7AM, please contact night-coverage [email or web address]

## 2023-11-02 LAB — GLUCOSE, CAPILLARY
Glucose-Capillary: 319 mg/dL — ABNORMAL HIGH (ref 70–99)
Glucose-Capillary: 298 mg/dL — ABNORMAL HIGH (ref 70–99)
Glucose-Capillary: 275 mg/dL — ABNORMAL HIGH (ref 70–99)
Glucose-Capillary: 117 mg/dL — ABNORMAL HIGH (ref 70–99)
Glucose-Capillary: 282 mg/dL — ABNORMAL HIGH (ref 70–99)

## 2023-11-02 LAB — CBC
HCT: 30.6 % — ABNORMAL LOW (ref 39.0–52.0)
Hemoglobin: 8.8 g/dL — ABNORMAL LOW (ref 13.0–17.0)
MCH: 27.2 pg (ref 26.0–34.0)
MCHC: 28.8 g/dL — ABNORMAL LOW (ref 30.0–36.0)
MCV: 94.7 fL (ref 80.0–100.0)
Platelets: 143 10*3/uL — ABNORMAL LOW (ref 150–400)
RBC: 3.23 MIL/uL — ABNORMAL LOW (ref 4.22–5.81)
RDW: 15.9 % — ABNORMAL HIGH (ref 11.5–15.5)
WBC: 4.7 10*3/uL (ref 4.0–10.5)
nRBC: 0 % (ref 0.0–0.2)

## 2023-11-02 LAB — RENAL FUNCTION PANEL
Albumin: 3.5 g/dL (ref 3.5–5.0)
Anion gap: 13 (ref 5–15)
BUN: 31 mg/dL — ABNORMAL HIGH (ref 6–20)
CO2: 28 mmol/L (ref 22–32)
Calcium: 9 mg/dL (ref 8.9–10.3)
Chloride: 96 mmol/L — ABNORMAL LOW (ref 98–111)
Creatinine, Ser: 4.62 mg/dL — ABNORMAL HIGH (ref 0.61–1.24)
GFR, Estimated: 15 mL/min — ABNORMAL LOW (ref >60)
Glucose, Bld: 190 mg/dL — ABNORMAL HIGH (ref 70–99)
Phosphorus: 5.5 mg/dL — ABNORMAL HIGH (ref 2.5–4.6)
Potassium: 4 mmol/L (ref 3.5–5.1)
Sodium: 137 mmol/L (ref 135–145)

## 2023-11-02 MED ORDER — HEPARIN SODIUM (PORCINE) 1000 UNIT/ML IJ SOLN
5000.0000 [IU] | Freq: Once | INTRAMUSCULAR | Status: AC
  Administered 2023-11-02: 5000 [IU]
  Filled 2023-11-02: qty 5

## 2023-11-02 NOTE — Progress Notes (Signed)
 Received patient in bed to unit.  Alert and oriented.  Informed consent signed and in chart.   TX duration:3.5hrs  Patient does not tolerate due to insistent use of narcotics during tx, inhibiting adequate fluid removal during tx. Pt threatens to refuse tx if he is not premedicated with pain medication. Transported back to the room  Alert, without acute distress.  Hand-off given to patient's nurse.   Access used: [facility] Access issues: none  Total UF removed: 500 Medication(s) given: dilaudid , post HD tx TDC heparin  blocks   11/02/23 1734  Vitals  BP (!) 90/36  Pulse Rate (!) 111  ECG Heart Rate (!) 111  Resp (!) 9  Oxygen  Therapy  SpO2 96 %  During Treatment Monitoring  Blood Flow Rate (mL/min) 400 mL/min  Arterial Pressure (mmHg) -56.16 mmHg  Venous Pressure (mmHg) 184.64 mmHg  TMP (mmHg) 6.87 mmHg  Ultrafiltration Rate (mL/min) 86 mL/min  Dialysate Flow Rate (mL/min) 300 ml/min  Duration of HD Treatment -hour(s) 3.47 hour(s)  Cumulative Fluid Removed (mL) per Treatment  147.61  HD Safety Checks Performed Yes  Intra-Hemodialysis Comments Tx completed  Dialysis Fluid Bolus Normal Saline  Bolus Amount (mL) 300 mL  Post Treatment  Dialyzer Clearance Heavily streaked  Hemodialysis Intake (mL) 100 mL  Fluid Removed (mL) 500 mL  Tolerated HD Treatment No (Comment)  Hemodialysis Catheter Left Femoral vein Double lumen Permanent (Tunneled)  Placement Date/Time: 07/16/23 1318   Serial / Lot #: [id number]  Expiration Date: 05/24/26  Time Out: Correct patient;Correct site;Correct procedure  Maximum sterile barrier precautions: Hand hygiene;Cap;Mask;Sterile gown;Sterile gloves;Large sterile ...  Site Condition No complications  Blue Lumen Status Flushed;Heparin  locked  Red Lumen Status Flushed;Heparin  locked  Catheter fill solution Heparin  1000 units/ml  Catheter fill volume (Arterial) 2.3 cc  Catheter fill volume (Venous) 2.3  Post treatment catheter status Capped and Clamped       Alan Duncan, RN [facility]

## 2023-11-02 NOTE — Progress Notes (Addendum)
PROGRESS NOTE    ROGUE RAFALSKI  ZOX:[id number] DOB: 11/04/1980 DOA: 09/09/2023 PCP: [name], NP   Brief Narrative: [name] is a 43 y.o. male with medical history significant of ESRD on hemodialysis (not fully compliant), insulin-dependent type 1 diabetes, gastroparesis, Left AKA, admitted to [facility] on 07/09/23 for DKA, Sepsis and hospital course significant for MV AND TV  MRSA endocarditis, followed by acute MCA stroke , left occipital and bilateral cerebellar strokes consistent with septic emboli,  Brain abscess from MRSA, left IJ DVT was transferred to [facility] for MV repair on 11/29.  While at [facility] he underwent cardiac cath showing non obstructive disease, craniotomy with abscess drainage on 09/01/23,. He had subarachnoid hemorrhage while on IV heparin, right third toe distal necrosis,was seen by podiatry, but family wanted conservative management. Since his tertiary care needs have been completed, he was transferred back to [facility] for further management.  PTOT  recommending SNF and awaiting placement.   Assessment and Plan:  MRSA TV/MV endocarditis Positive MRSA blood cultures. Diagnosed on Transesophageal Echocardiogram. Patient managed on Vancomycin. At outside hospital, patient underwent a left heart catheterization which was significant for nonobstructive disease. Infectious disease recommendations to continue Vancomycin IV and patient has completed his 8 week treatment course.  Prostate fluid collection/abscess Urology consulted at outside hospital and per their assessment, fluid collection was secondary to dilated seminal vesicle with no surgical intervention recommended. Infectious disease with continued concern for possible abscess. Patient covered with vancomycin with recommendation for outpatient urology follow-up.  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 Patient evaluated by neurosurgery. Concern for abscess ruled out by neurosurgery  initially. No surgical intervention per neurosurgery. However, at outside hospital, patient was evaluated by neurosurgery who performed a craniotomy for abscess drainage.  ESRD on HD Nephrology consulted and managing hemodialysis.  Hyperkalemia Improved with HD.  Anemia of chronic kidney disease Hemoglobin stable.  CKD mineral bone disease -Continue calcitriol  Acute CVA MRI confirms large right MCA branch infarct. MRA head (11/25) significant for large necrotic appearing lesion within the right frontal lobe within area of the prior right MCA territory infarct with vasogenic edema and 8 mm leftward midline shift. LDL of 34. Hemoglobin A1C of 8.9%. Transthoracic Echocardiogram (10/13) significant for an LVEF of 25-30% with concern for AV/MV abnormality with Transesophageal Echocardiogram (10/16 & 10/22) significant for MV/TV vegetations. Neurology recommendations for Eliquis. PT/OT recommendations for SNF.  Primary hypertension Patient is on metoprolol as an outpatient. Metoprolol held.  Acute left upper extremity DVT -Continue Eliquis  Right third toe necrosis Noted. Per documentation, patient/family opting for conservative management and decline surgical management.  Diabetes mellitus type 1 Poorly controlled with hyperglycemia and hypoglycemia. Hemoglobin A1C of 8.9%. Seems mostly due to continued poor dietary choices. Partly iatrogenic secondary to attempting to adjust regimen for dietary indiscretions. -Continue Semglee 4 units daily and continue SSI  Gastroparesis Noted. Patient is not on medication management apart from insulin for blood sugar control. Patient without symptoms and is tolerating his diet.  History of left BKA Noted. Patient seen by PT/OT with recommendation for SNF on discharge.  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 Patient started on Seroquel for behavioral change management.  Overweight Estimated body mass index is 33.25 kg/m as calculated  from the following:   Height as of this encounter: 5\' 7"  (1.702 m).   Weight as of this encounter: 96.3 kg.  Pressure injury Left buttock. Not present on admission.   DVT prophylaxis: Eliquis Code  Status:   Code Status: Full Code Family Communication: None at bedside Disposition Plan: Discharge to SNF pending bed availability   Consultants:  Nephrology  Procedures:  Hemodialysis  Antimicrobials: Vancomycin Linezolid    Subjective: No issues overnight.  Objective: BP (!) 103/43 (BP Location: Right Leg)   Pulse (!) 117   Temp 98.4 F (36.9 C)   Resp 18   Ht 5\' 7"  (1.702 m)   Wt 96.3 kg   SpO2 92%   BMI 33.25 kg/m   Examination:  General exam: Appears calm and comfortable   Data Reviewed: I have personally reviewed following labs and imaging studies  CBC Lab Results  Component Value Date   WBC 5.8 11/01/2023   RBC 3.45 (L) 11/01/2023   HGB 9.5 (L) 11/01/2023   HCT 33.0 (L) 11/01/2023   MCV 95.7 11/01/2023   MCH 27.5 11/01/2023   PLT 141 (L) 11/01/2023   MCHC 28.8 (L) 11/01/2023   RDW 16.1 (H) 11/01/2023   LYMPHSABS 1.2 10/17/2023   MONOABS 0.4 10/17/2023   EOSABS 0.7 (H) 10/17/2023   BASOSABS 0.1 10/17/2023     Last metabolic panel Lab Results  Component Value Date   NA 137 11/01/2023   K 5.5 (H) 11/01/2023   CL 96 (L) 11/01/2023   CO2 24 11/01/2023   BUN 46 (H) 11/01/2023   CREATININE 6.30 (H) 11/01/2023   GLUCOSE 325 (H) 11/01/2023   GFRNONAA 10 (L) 11/01/2023   GFRAA 7 (L) 05/30/2020   CALCIUM 9.0 11/01/2023   PHOS 7.9 (H) 11/01/2023   PROT 7.7 10/19/2023   PROT 7.8 10/19/2023   ALBUMIN 3.5 11/01/2023   BILITOT 1.3 (H) 10/19/2023   BILITOT 1.3 (H) 10/19/2023   ALKPHOS 150 (H) 10/19/2023   ALKPHOS 150 (H) 10/19/2023   AST 20 10/19/2023   AST 19 10/19/2023   ALT 10 10/19/2023   ALT 8 10/19/2023   ANIONGAP 17 (H) 11/01/2023    GFR: Estimated Creatinine Clearance: 16.6 mL/min (A) (by C-G formula based on SCr of 6.3 mg/dL  (H)).  No results found for this or any previous visit (from the past 240 hours).    Radiology Studies: No results found.    LOS: 54 days    Jacquelin Hawking, MD [facility] 11/02/2023, 11:37 AM   If 7PM-7AM, please contact night-coverage [email or web address]

## 2023-11-02 NOTE — Progress Notes (Signed)
 [facility] Progress Note   Subjective:    Seen and examined patient on HD. Tolerating UFG 2L. Blood pressures remain soft but stable. He just received a dose of Dilaudid .   Objective Vitals:   11/02/23 0450 11/02/23 1322 11/02/23 1330 11/02/23 1345  BP: (!) 103/43 (!) 120/55 109/71   Pulse: (!) 117 (!) 112 (!) 110   Resp: 18 13 11    Temp: 98.4 F (36.9 C) 98 F (36.7 C)    TempSrc:  Axillary    SpO2: 92% 94% (!) 87%   Weight:    98.9 kg  Height:       Physical Exam General: Chronically ill appearing male in NAD Heart: S1,S2 RRR No M/R/G Lungs: Clear anteriorly. Breathing unlabored Abdomen:NABS, NT Extremities: L AKA no stump edema, no RLE edema Dialysis Access: L femoral TDC Drsg intact  Filed Weights   10/30/23 0926 10/30/23 1334 11/02/23 1345  Weight: 97.1 kg 96.3 kg 98.9 kg    Intake/Output Summary (Last 24 hours) at 11/02/2023 1346 Last data filed at 11/02/2023 0845 Gross per 24 hour  Intake 1120 ml  Output 0 ml  Net 1120 ml    Additional Objective Labs: Basic Metabolic Panel: Recent Labs  Lab 10/28/23 0356 10/30/23 0927 11/01/23 2057  NA 135 138 137  K 4.8 4.9 5.5*  CL 97* 98 96*  CO2 27 28 24   GLUCOSE 228* 109* 325*  BUN 32* 38* 46*  CREATININE 5.85* 5.94* 6.30*  CALCIUM  8.9 9.4 9.0  PHOS  --  6.8* 7.9*   Liver Function Tests: Recent Labs  Lab 10/30/23 0927 11/01/23 2057  ALBUMIN  3.5 3.5   No results for input(s): LIPASE, AMYLASE in the last 168 hours. CBC: Recent Labs  Lab 10/28/23 0356 10/30/23 0927 11/01/23 2057  WBC 4.8 4.4 5.8  HGB 8.6* 8.4* 9.5*  HCT 29.2* 28.6* 33.0*  MCV 94.2 94.4 95.7  PLT 158 155 141*   Blood Culture    Component Value Date/Time   SDES BLOOD SITE NOT SPECIFIED 08/25/2023 1706   SPECREQUEST  08/25/2023 1706    BOTTLES DRAWN AEROBIC ONLY Blood Culture results may not be optimal due to an inadequate volume of blood received in culture bottles   CULT  08/25/2023 1706    NO GROWTH 5  DAYS Performed at [facility], [address] [place], [name] [address]    REPTSTATUS 08/30/2023 FINAL 08/25/2023 1706    Cardiac Enzymes: No results for input(s): CKTOTAL, CKMB, CKMBINDEX, TROPONINI in the last 168 hours. CBG: Recent Labs  Lab 11/01/23 1618 11/01/23 2056 11/02/23 0306 11/02/23 0750 11/02/23 1217  GLUCAP 195* 305* 319* 298* 275*   Iron  Studies: No results for input(s): IRON , TIBC, TRANSFERRIN, FERRITIN in the last 72 hours. Lab Results  Component Value Date   INR 1.2 07/22/2020   INR 1.0 05/30/2020   INR 1.2 04/24/2019   Studies/Results: No results found.  Medications:  anticoagulant sodium citrate       (feeding supplement) PROSource Plus  30 mL Oral BID BM   apixaban   5 mg Oral BID   bacitracin    Topical Daily   Chlorhexidine  Gluconate Cloth  6 each Topical Q0600   darbepoetin (ARANESP ) injection - DIALYSIS  100 mcg Subcutaneous Q Sun-1800   gabapentin   100 mg Oral TID   Gerhardt's butt cream   Topical BID   insulin  aspart  0-6 Units Subcutaneous TID WC   insulin  glargine-yfgn  4 Units Subcutaneous Daily   loratadine   10  mg Oral Daily   midodrine   10 mg Oral Q T,Th,Sa-HD   pantoprazole   40 mg Oral Daily   povidone-Iodine    Topical Q0600   QUEtiapine   12.5 mg Oral q morning   QUEtiapine   25 mg Oral QHS   senna-docusate  2 tablet Oral BID   sevelamer  carbonate  2,400 mg Oral TID WC   sodium zirconium cyclosilicate   10 g Oral TID    Dialysis Orders: MWF - [facility] -> may be will be going [facility] on discharge 4hr, 400/A1.5, EDW 77kg, L femoral TDC, 2K/3Ca bath, no heparin   Assessment/Plan: MRSA bacteremia with MV/TV endocarditis + brain abscess:  Not a candidate for valve surgery, S/p line holiday and brain abscess drainage (at [facility]). Continues on extended Vanc course - will be on Vanc 750mcg IV q HD until 10/27/23. S/p transfer to [facility] and drainage 08/31/24. Repeat brain MRI 10/17/23 showed  improvement. Acute B CVA/septic emboli- due to above Acute LUE DVT: On Eliquis  ESRD: Transitioned to TTS. On HD. HTN/volume: BP stable, pre hd  drop with uf  today  give albumin  , CT 10/09/23 with diffuse anasarca. Significantly above EDW. But bed wts  Max UF with HD. Continue to try to enforce fluid restrictions/compliance with full HD time Anemia of ESRD: Hgb 8.4<10.1, ??if dilutional , fu trend Aranesp  to 150 mcg q Sunday while here. Last 1/19.  No IV iron  while on abx. Secondary HPTH: CorrCa/Phos high. Continue to hold calcitriol . Sevelamer  increased to 3/meals.  Continues to have outside food and refuses any diet except regular (chronic problem with him despite counseling) Hyperkalemia: had been persistently high despite Lokelma  10g TID, using low bath intermittently with HD.  As noted above diet indiscretion despite counseling Nutrition: Alb 3.3  continue supplements.  T1DM: per primary Dispo: SNF being arranged - not finalized yet  Charmaine Piety, NP [facility] 11/02/2023,1:46 PM  LOS: 54 days

## 2023-11-02 NOTE — Plan of Care (Signed)
  Problem: Health Behavior/Discharge Planning: Goal: Ability to manage health-related needs will improve Outcome: Progressing   Problem: Clinical Measurements: Goal: Ability to maintain clinical measurements within normal limits will improve Outcome: Progressing Goal: Will remain free from infection Outcome: Progressing Goal: Diagnostic test results will improve Outcome: Progressing Goal: Cardiovascular complication will be avoided Outcome: Progressing   Problem: Coping: Goal: Level of anxiety will decrease Outcome: Progressing   Problem: Coping: Goal: Ability to adjust to condition or change in health will improve Outcome: Progressing   Problem: Health Behavior/Discharge Planning: Goal: Ability to identify and utilize available resources and services will improve Outcome: Progressing Goal: Ability to manage health-related needs will improve Outcome: Progressing   Problem: Metabolic: Goal: Ability to maintain appropriate glucose levels will improve Outcome: Progressing   Problem: Skin Integrity: Goal: Risk for impaired skin integrity will decrease Outcome: Progressing   Problem: Tissue Perfusion: Goal: Adequacy of tissue perfusion will improve Outcome: Progressing   Problem: Activity: Goal: Risk for activity intolerance will decrease Outcome: Not Progressing   Problem: Skin Integrity: Goal: Risk for impaired skin integrity will decrease Outcome: Not Progressing   Problem: Fluid Volume: Goal: Ability to maintain a balanced intake and output will improve Outcome: Not Progressing  Doing more c PT but not out of bed but going to HD so may have limited him. Resistant to turning and getting off of bottom. Exceeding fluid limits despite education

## 2023-11-02 NOTE — Inpatient Diabetes Management (Signed)
Inpatient Diabetes Program Recommendations  AACE/ADA: New Consensus Statement on Inpatient Glycemic Control (2015)  Target Ranges:  Prepandial:   less than 140 mg/dL      Peak postprandial:   less than 180 mg/dL (1-2 hours)      Critically ill patients:  140 - 180 mg/dL   Lab Results  Component Value Date   GLUCAP 298 (H) 11/02/2023   HGBA1C 8.9 (H) 07/10/2023    Review of Glycemic Control  Latest Reference Range & Units 11/01/23 08:15 11/01/23 08:20 11/01/23 10:38 11/01/23 16:18 11/01/23 20:56 11/02/23 03:06 11/02/23 07:50  Glucose-Capillary 70 - 99 mg/dL 42 (LL) 47 (L) 86 638 (H) 305 (H) 319 (H) 298 (H)  (LL): Data is critically low (L): Data is abnormally low (H): Data is abnormally high Diabetes history: DM1 Outpatient Diabetes medications: Lantus 4 daily, Humalog 0-4 TID Current orders for Inpatient glycemic control: Semglee 4 daily, Novolog 0-6 TID with meals   Inpatient Diabetes Program Recommendations:   Patient very sensitive to insulin. Noted hypoglycemia yesterday and subsequent changes.    Consider increasing Semglee to 5 daily.  Thanks, Lujean Rave, MSN, RNC-OB Diabetes Coordinator [phone] (8a-5p)

## 2023-11-02 NOTE — Progress Notes (Signed)
 Physical Therapy Treatment Patient Details Name: [name] MRN: [id number] DOB: 06/13/1981 Today's Date: 11/02/2023   History of Present Illness 43 y.o. male presents to [facility] 09/09/23 as a transfer from [facility] for further management of endocarditis. Pt originally was admitted to [facility] 07/09/23 for DKA and sepsis after being found on the ground. 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 10/17 pt had a R MCA, L occipital, and B cerebellum CVA w/ septic emboli. ICU admit 10/18 w/ intubation 10/18-10/24. Pt was transferred to [facility] for MV repair on 11/29. [facility], pt had cardiac cath w/ non obstructive disease, SAH, a craniotomy w/ abscess drainage on 12/4, and R third toe distal necrosis. PMH: chronic L AKA, MRSA, IDDM, ESRD on HD MWF, HTN, TEE positive for vegetation on multiple valves, cellulitis, medical noncompliance, narcotic dependence, DM with gastroparesis, L internal jugular DVT.    PT Comments  Continuing work on functional mobility and activity tolerance; session focused on bed mobility, sitting, balance and tolerance, and to consider a trial with [name]'s left prosthesis because that is one of his stated goals;   Notably good effort to roll and sit up on [name]'s part; he chose to get up on the R side of the bed, and used his RLE well to brdge his trunk into a position to give more space to roll; we opted to put a sling on the RUE for easier management of RUE during movement; He needs heavy mod assist to roll and to pull to sit; Sat EOB for a while, with RLE touching the floor, and his trunk and body oriented towards the EOB;   [name] was able to describe the way in which he typically dons his prosthesis, which is lying down in bed with his left prosthesis, pointing up in the air; He talked this PT and Mobility Specialist through donning the liner (which is too small for his L residual limb), and described having a strap and a groove/notch/hole that a  strap goes through (we were unable to locate the strap); Gave it a try to test putting the socket on the liner in the way he usually does it -- poor fit, and unusable currently; also noticed how close the hard socket comes to the L femoral HD catheter; reached out to Nephrology and [name]'s Prosthetist to discuss; Ultimately, it looks like the L prosthesis socket will put [name]'s HD access at risk, and at this time, prosthesis training is not worth pursuing; If he ever has a different HD access, we can reconsider; I anticipate this will not be received well, considering using the L prosthesis is one of his stated goals; [name], [facility] with [facility] is willing to come discuss with [name] (they have a good rapport) he can be reached by calling [facility];   While [name] can take a while to warm up to a PT, once he does, he participates overall well; it can be time consuming; My hope is that at a [name], where PT, OT, and ST can occur at more predicatble times, and with more consistent therapists, he will make more consistent improvements than he has here acutely; He has the potential to make functional progress, and the goal of more independence with transfers is not unreachable with consistent rehab   If plan is discharge home, recommend the following: Two people to help with walking and/or transfers;Assist for transportation;Supervision due to cognitive status;Help with stairs or ramp for entrance;Two people to  help with bathing/dressing/bathroom;Assistance with cooking/housework;Direct supervision/assist for financial management;Direct supervision/assist for medications management   Can travel by private vehicle     No  Equipment Recommendations  Hoyer lift;Wheelchair (measurements PT);Wheelchair cushion (measurements PT);Other (comment)    Recommendations for Other Services       Precautions / Restrictions Precautions Precautions: Fall;Other (comment) Precaution Comments: L AKA, L  hemiparesis and edema, L neglect, bowel incontinence. L femoral HD cath Restrictions Weight Bearing Restrictions Per Provider Order: No Other Position/Activity Restrictions: Pt has sling for LUE when mobilizing OOB; LLE prosthetic not fitting     Mobility  Bed Mobility Overal bed mobility: Needs Assistance Bed Mobility: Rolling Rolling: Mod assist   Supine to sit: Mod assist, +2 for safety/equipment (Heavy mod assist) Sit to supine: Mod assist   General bed mobility comments: L UE sling on; Good effort to roll to R, using R UE pulling to bedrail, bridging with RLE to move hips and allow for room to roll, and L hip flexion noted; reaches for handheld assist to pull to sit (HOB slightly elevated)    Transfers                        Ambulation/Gait                   Stairs             Wheelchair Mobility     Tilt Bed    Modified Rankin (Stroke Patients Only)       Balance     Sitting balance-[name]: Fair Sitting balance - Comments: close guard initially and progressed to supervision; [name] sat at [facility] with R foot on the floor, and his trun was oriented more towards the foot of the bed; Good use of R UE to hold balance, and he was dependent on RUE support initially, but progressed to being able to converse and let go with his R hand (moving his R hand as he expressed himself)                                    Cognition Arousal: Alert Behavior During Therapy: WFL for tasks assessed/performed (after he woke up a bit) Overall Cognitive Status: Impaired/Different from baseline Area of Impairment: Attention, Memory, Following commands, Problem solving                     Memory: Decreased short-term memory Following Commands: Follows one step commands with increased time Safety/Judgement: Decreased awareness of deficits Awareness: Intellectual Problem Solving: Slow processing, Difficulty sequencing General Comments: pt  benefits from increased time, poor attention to task, needing frequent redirection. pt also needing increased cues and instruction for importance of LE strength and ROM to meet his goals (use of prosthetic)        Exercises      General Comments General comments (skin integrity, edema, etc.): Noting pt's goal of using the L LE prosthesis again; reached out to Nephrology and Prosthetist to discuss      Pertinent Vitals/Pain Pain Assessment Pain Assessment: Faces Pain Score: 0-No pain Pain Intervention(s): Monitored during session    Home Living                          Prior Function            PT Goals (current goals  can now be found in the care plan section) Acute Rehab PT Goals Patient Stated Goal: rehab at SNF PT Goal Formulation: With patient Time For Goal Achievement: 11/09/23 Potential to Achieve Goals: Fair Progress towards PT goals: Progressing toward goals    Frequency    Min 1X/week      PT Plan      Co-evaluation              AM-PAC PT 6 Clicks Mobility   Outcome Measure  Help needed turning from your back to your side while in a flat bed without using bedrails?: A Lot Help needed moving from lying on your back to sitting on the side of a flat bed without using bedrails?: A Lot Help needed moving to and from a bed to a chair (including a wheelchair)?: Total Help needed standing up from a chair using your arms (e.g., wheelchair or bedside chair)?: Total Help needed to walk in hospital room?: Total Help needed climbing 3-5 steps with a railing? : Total 6 Click Score: 8    End of Session Equipment Utilized During Treatment: Other (comment) (bed pad) Activity Tolerance: Patient tolerated treatment well Patient left: in bed;with family/visitor present Nurse Communication: Mobility status;Need for lift equipment (PT is done; can return with pt's meds) PT Visit Diagnosis: Other abnormalities of gait and mobility (R26.89);Other symptoms  and signs involving the nervous system (R29.898);Hemiplegia and hemiparesis Hemiplegia - Right/Left: Left Hemiplegia - dominant/non-dominant: Non-dominant Hemiplegia - caused by: Cerebral infarction     Time: 8945-8855 PT Time Calculation (min) (ACUTE ONLY): 50 min  Charges:    $Therapeutic Activity: 38-52 mins PT General Charges $$ ACUTE PT VISIT: 1 Visit                     Silvano Currier, PT  Acute Rehabilitation Services Office [phone] Secure Chat welcomed    Silvano VEAR Currier 11/02/2023, 2:59 PM

## 2023-11-02 NOTE — Progress Notes (Signed)
 Contacted [facility] yesterday to provide update on pt's current snf situation. Clinic may have a chair available for pt if pt is accepted at a [facility] snf and is d/c in the near future. Update provided to CSW and will assist as needed.   Randine Mungo Renal Navigator [phone]

## 2023-11-03 LAB — GLUCOSE, CAPILLARY
Glucose-Capillary: 194 mg/dL — ABNORMAL HIGH (ref 70–99)
Glucose-Capillary: 189 mg/dL — ABNORMAL HIGH (ref 70–99)
Glucose-Capillary: 157 mg/dL — ABNORMAL HIGH (ref 70–99)
Glucose-Capillary: 367 mg/dL — ABNORMAL HIGH (ref 70–99)

## 2023-11-03 MED ORDER — DARBEPOETIN ALFA 150 MCG/0.3ML IJ SOSY
150.0000 ug | PREFILLED_SYRINGE | INTRAMUSCULAR | Status: DC
  Administered 2023-11-07 – 2023-11-14 (×2): 150 ug via SUBCUTANEOUS
  Filled 2023-11-03 (×2): qty 0.3

## 2023-11-03 MED ORDER — CHLORHEXIDINE GLUCONATE CLOTH 2 % EX PADS
6.0000 | MEDICATED_PAD | Freq: Every day | CUTANEOUS | Status: DC
  Administered 2023-11-04 – 2023-11-05 (×2): 6 via TOPICAL

## 2023-11-03 NOTE — Progress Notes (Signed)
 Physical Therapy Treatment  Patient Details Name: [name] MRN: [id number] DOB: [birth year] Today's Date: 11/03/2023   History of Present Illness 43 y.o. male presents to [name] 09/09/23 as a transfer from [name] for further management of endocarditis. Pt originally was admitted to [name] 07/09/23 for DKA and sepsis after being found on Shane ground. 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 10/17 pt had a R MCA, L occipital, and B cerebellum CVA w/ septic emboli. ICU admit 10/18 w/ intubation 10/18-10/24. Pt was transferred to [name] for MV repair on 11/29. At [name], pt had cardiac cath w/ non obstructive disease, SAH, a craniotomy w/ abscess drainage on 12/4, and R third toe distal necrosis. PMH: chronic L AKA, MRSA, IDDM, ESRD on HD MWF, HTN, TEE positive for vegetation on multiple valves, cellulitis, medical noncompliance, narcotic dependence, DM with gastroparesis, L internal jugular DVT.    PT Comments  Pt progressing slowly towards physical therapy goals. Was able to demonstrate ~15 minutes sitting EOB and engaged in therapeutic exercise and core stability training. Pt engaged with exercise and motivated to participate as session progressed. Will continue to follow and progress as able per POC.    If plan is discharge home, recommend Shane following: Two people to help with walking and/or transfers;Assist for transportation;Supervision due to cognitive status;Help with stairs or ramp for entrance;Two people to help with bathing/dressing/bathroom;Assistance with cooking/housework;Direct supervision/assist for financial management;Direct supervision/assist for medications management   Can travel by private vehicle     No  Equipment Recommendations  Hoyer lift;Wheelchair (measurements PT);Wheelchair cushion (measurements PT);Other (comment)    Recommendations for Other Services Rehab consult     Precautions / Restrictions Precautions Precautions:  Fall;Other (comment) Precaution Comments: L AKA, L hemiparesis and edema, L neglect, bowel incontinence. L femoral HD cath Restrictions Weight Bearing Restrictions Per Provider Order: No LLE Weight Bearing Per Provider Order: Non weight bearing Other Position/Activity Restrictions: Pt has sling for LUE when mobilizing OOB; LLE prosthetic not fitting     Mobility  Bed Mobility Overal bed mobility: Needs Assistance Bed Mobility: Rolling Rolling: Mod assist   Supine to sit: Mod assist, +2 for safety/equipment (Heavy mod assist) Sit to supine: Mod assist, +2 for physical assistance   General bed mobility comments: Assist for all aspects of bed mobility. Reaches for HHA to pull into long sitting with RLE off EOB. Bed pad utilized for scoot around to sit fully on EOB. Assist required for LUE management (sling not donned for EOB activity).    Transfers Overall transfer level: Needs assistance   Transfers: Bed to chair/wheelchair/BSC            Lateral/Scoot Transfers: Max assist, +2 physical assistance General transfer comment: Lateral scoots towards HOB with bed pad assist. x3 scoots total.    Ambulation/Gait                   Stairs             Wheelchair Mobility     Tilt Bed    Modified Rankin (Stroke Patients Only) Modified Rankin (Stroke Patients Only) Pre-Morbid Rankin Score: Moderate disability Modified Rankin: Severe disability     Balance Overall balance assessment: Needs assistance Sitting-balance support: Single extremity supported, Feet supported Sitting balance-[name]: Fair Sitting balance - Comments: close guard initially and progressed to supervision; [name] sat at [name] with R foot on Shane floor, and his trun was oriented more towards Shane foot of Shane bed; Good  use of R UE to hold balance, and he was dependent on RUE support initially, but progressed to being able to converse and let go with his R hand (moving his R hand as he expressed  himself) Postural control: Posterior lean                                  Cognition Arousal: Alert Behavior During Therapy: WFL for tasks assessed/performed Overall Cognitive Status: Impaired/Different from baseline Area of Impairment: Attention, Memory, Following commands, Problem solving                 Orientation Level: Disoriented to, Time Current Attention Level: Selective Memory: Decreased short-term memory Following Commands: Follows one step commands with increased time Safety/Judgement: Decreased awareness of deficits Awareness: Intellectual Problem Solving: Slow processing, Difficulty sequencing General Comments: pt benefits from increased time, poor attention to task, needing frequent redirection. pt also needing increased cues and instruction for importance of LE strength and ROM to meet his goals (use of prosthetic)        Exercises General Exercises - Upper Extremity Shoulder Flexion: PROM, Left, 10 reps Shoulder ABduction: PROM, Left, 10 reps Elbow Flexion: PROM, Left, 10 reps Elbow Extension: PROM, Left, 10 reps Wrist Flexion: PROM, Left, 10 reps Wrist Extension: PROM, Left, 10 reps Digit Composite Flexion: PROM, Left, 10 reps (partial ROM) Composite Extension: PROM, Left, 10 reps (partial ROM) General Exercises - Lower Extremity Long Arc Quad: AROM, 10 reps, Right Heel Slides: AROM, 10 reps, Strengthening, Right Hip Flexion/Marching: 10 reps, AROM, Right, Seated Other Exercises Other Exercises: RUE punches to target progressing to pt reaching outside BOS (minimally) to both L, R, and anteriorly. x5 reps at a time to x8 targets    General Comments        Pertinent Vitals/Pain Pain Assessment Pain Assessment: Faces Faces Pain Scale: No hurt Pain Intervention(s): Monitored during session    Home Living                          Prior Function            PT Goals (current goals can now be found in Shane care plan  section) Acute Rehab PT Goals Patient Stated Goal: rehab at SNF PT Goal Formulation: With patient Time For Goal Achievement: 11/09/23 Potential to Achieve Goals: Fair Progress towards PT goals: Progressing toward goals    Frequency    Min 1X/week      PT Plan      Co-evaluation              AM-PAC PT 6 Clicks Mobility   Outcome Measure  Help needed turning from your back to your side while in a flat bed without using bedrails?: A Lot Help needed moving from lying on your back to sitting on Shane side of a flat bed without using bedrails?: A Lot Help needed moving to and from a bed to a chair (including a wheelchair)?: Total Help needed standing up from a chair using your arms (e.g., wheelchair or bedside chair)?: Total Help needed to walk in [name] room?: Total Help needed climbing 3-5 steps with a railing? : Total 6 Click Score: 8    End of Session Equipment Utilized During Treatment: Other (comment) (bed pad) Activity Tolerance: Patient tolerated treatment well Patient left: in bed;with family/visitor present Nurse Communication: Mobility status;Need for lift equipment (PT is done;  can return with pt's meds) PT Visit Diagnosis: Other abnormalities of gait and mobility (R26.89);Other symptoms and signs involving Shane nervous system (R29.898);Hemiplegia and hemiparesis Hemiplegia - Right/Left: Left Hemiplegia - dominant/non-dominant: Non-dominant Hemiplegia - caused by: Cerebral infarction     Time: 8886-8855 PT Time Calculation (min) (ACUTE ONLY): 31 min  Charges:    $Therapeutic Exercise: 8-22 mins $Therapeutic Activity: 8-22 mins PT General Charges $$ ACUTE PT VISIT: 1 Visit                     Leita Sable, PT, DPT Acute Rehabilitation Services Secure Chat Preferred Office: [phone]    Leita JONETTA Sable 11/03/2023, 1:43 PM

## 2023-11-03 NOTE — Progress Notes (Signed)
 Pt assisted on and off bedpan. He informed me his mother was on the way and that she wanted to discuss with me my attitude. Pt repeated himself multiple times with same information. He said he was showing me much grace and had been polite to all staff through out today. He was not happy with me earlier when PT assisted me with taking pt off of bedpan. He wanted a nurse to do this and his privacy maintained. Pt educated that this is a normal part of PT care but he was not receptive. Due to pt's limited mobility from L side paralysis two persons are needed to change bed pad and bandage. He was insistent that bed pad be changed even though not dirty in any way. Pt not receptive to PT helping me but pt is limited in his rolling. I also tried to educate through out day about fluid restriction and narcotic use. Pt not receptive. Issues during the day: Pt insistent of wearing depends. He cannot help don depends, this make it harder to get him off of bedpan, the depends he brings are too small d/t his edema (pt aware and says he will order larger). On and off bedpan 3 plus times today. Pt with hard stool, educated on laxatives/stool softeners-declines. Pt was upset that I would not take empty cup from his room and put ice in it. Informed we have to get a clean cup each time even for people who are not on precautions for MRSA. Each time I enter room I ask him if there is anything else I can bring him. He responds with needs. Once room entered there are numerous requests and this frequently requires in and out of room 3 plus time for one interaction due to his changing his mind on needs or adding on. Pt will throw linens that are just one hour old/non dirty on the floor and demand fresh linens. This happens throughout the day with multiple staff members. I try to limit interaction d/t no matter how I respond he is displeased. At this time pt asked me what I thought about my attitude. I said no comment. He asked me to get  out and not return. He asked for another nurse for remaining 45 minutes of my shift but denies a need at present.

## 2023-11-03 NOTE — Progress Notes (Signed)
 Update provided to [facility] (out-pt HD clinic). Will assist as needed.   Lauraine Polite Renal Navigator [phone]

## 2023-11-03 NOTE — TOC Progression Note (Signed)
 Transition of Care [name]) - Progression Note    Patient Details  Name: [name] MRN: [id number] Date of Birth: 11/22/1980  Transition of Care [name]) CM/SW Contact  [name], [name] Phone Number: 11/03/2023, 1:44 PM  Clinical Narrative:     CSW is informed by [name] with [name] that they are still unable to offer a bed. Informed that he is still refusing care at times and noted to resort to name calling after being denied narcotics. He had also signed AMA paperwork on 2.1.25. Due to these behaviors [name] is still declining pt.   Expected Discharge Plan: Skilled Nursing Facility Barriers to Discharge: Continued Medical Work up  Expected Discharge Plan and Services                                               Social Determinants of Health (SDOH) Interventions SDOH Screenings   Food Insecurity: No Food Insecurity (09/09/2023)  Housing: Unknown (09/09/2023)  Transportation Needs: No Transportation Needs (09/09/2023)  Utilities: Not At Risk (09/09/2023)  Alcohol Screen: Low Risk  (02/15/2023)  Depression (PHQ2-9): Low Risk  (02/15/2023)  Financial Resource Strain: Low Risk  (02/15/2023)  Physical Activity: Inactive (02/15/2023)  Social Connections: Socially Integrated (02/15/2023)  Stress: No Stress Concern Present (02/15/2023)  Tobacco Use: Low Risk  (09/12/2023)  Recent Concern: Tobacco Use - Medium Risk (09/01/2023)   Received from [facility]    Readmission Risk Interventions     No data to display

## 2023-11-03 NOTE — Progress Notes (Signed)
 PROGRESS NOTE    [name]  FMW:[id number] DOB: [birth year] DOA: 09/09/2023 PCP: Celestia Rosaline SQUIBB, NP   Brief Narrative: [name] is a 43 y.o. 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  While at [facility] he underwent cardiac cath showing non obstructive disease, craniotomy with abscess drainage on 09/01/23,. He had subarachnoid hemorrhage while on IV heparin , right third toe distal necrosis,was seen by podiatry, but family wanted conservative management. Since his tertiary care needs have been completed, he was transferred back to [facility] for further management.  PTOT  recommending SNF and awaiting placement.   Assessment and Plan:  MRSA TV/MV endocarditis Positive MRSA blood cultures. Diagnosed on Transesophageal Echocardiogram. Patient managed on Vancomycin . At outside hospital, patient underwent a left heart catheterization which was significant for nonobstructive disease. Infectious disease recommendations to continue Vancomycin  IV and patient has completed his 8 week treatment course.  Prostate fluid collection/abscess Urology consulted at outside hospital and per their assessment, fluid collection was secondary to dilated seminal vesicle with no surgical intervention recommended. Infectious disease with continued concern for possible abscess. Patient covered with vancomycin  with recommendation for outpatient urology follow-up.  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 Patient evaluated by neurosurgery. Concern for abscess ruled out by neurosurgery  initially. No surgical intervention per neurosurgery. However, at outside hospital, patient was evaluated by neurosurgery who performed a craniotomy for abscess drainage.  ESRD on HD Nephrology consulted and managing hemodialysis.  Hyperkalemia Improved with HD.  Anemia of chronic kidney disease Hemoglobin stable.  CKD mineral bone disease -Continue calcitriol   Acute CVA MRI confirms large right MCA branch infarct. MRA head (11/25) significant for large necrotic appearing lesion within the right frontal lobe within area of the prior right MCA territory infarct with vasogenic edema and 8 mm leftward midline shift. LDL of 34. Hemoglobin A1C of 8.9%. Transthoracic Echocardiogram (10/13) significant for an LVEF of 25-30% with concern for AV/MV abnormality with Transesophageal Echocardiogram (10/16 & 10/22) significant for MV/TV vegetations. Neurology recommendations for Eliquis . PT/OT recommendations for SNF.  Primary hypertension Patient is on metoprolol  as an outpatient. Metoprolol  held.  Acute left upper extremity DVT -Continue Eliquis   Right third toe necrosis Noted. Per documentation, patient/family opting for conservative management and decline surgical management.  Diabetes mellitus type 1 Poorly controlled with hyperglycemia and hypoglycemia. Hemoglobin A1C of 8.9%. Seems mostly due to continued poor dietary choices. Partly iatrogenic secondary to attempting to adjust regimen for dietary indiscretions. -Continue Semglee  4 units daily and continue SSI  Gastroparesis Noted. Patient is not on medication management apart from insulin  for blood sugar control. Patient without symptoms and is tolerating his diet.  History of left BKA Noted. Patient seen by PT/OT with recommendation for SNF on discharge.  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 Patient started on Seroquel  for behavioral change management.  Overweight Estimated body mass index is 33.98 kg/m as calculated  from the following:   Height as of this encounter: 5' 7 (1.702 m).   Weight as of this encounter: 98.4 kg.  Pressure injury Left buttock. Not present on admission.   DVT prophylaxis: Eliquis  Code  Status:   Code Status: Full Code Family Communication: None at bedside Disposition Plan: Discharge to SNF pending bed availability   Consultants:  Nephrology  Procedures:  Hemodialysis  Antimicrobials: Vancomycin  Linezolid     Subjective: No issues this morning. No issues overnight. States some representatives from a facility came to see him yesterday.  Objective: BP 135/76 (BP Location: Right Leg)   Pulse 99   Temp 98.5 F (36.9 C)   Resp 18   Ht 5' 7 (1.702 m)   Wt 98.4 kg   SpO2 98%   BMI 33.98 kg/m   Examination:  General exam: Appears calm and comfortable   Data Reviewed: I have personally reviewed following labs and imaging studies  CBC Lab Results  Component Value Date   WBC 4.7 11/02/2023   RBC 3.23 (L) 11/02/2023   HGB 8.8 (L) 11/02/2023   HCT 30.6 (L) 11/02/2023   MCV 94.7 11/02/2023   MCH 27.2 11/02/2023   PLT 143 (L) 11/02/2023   MCHC 28.8 (L) 11/02/2023   RDW 15.9 (H) 11/02/2023   LYMPHSABS 1.2 10/17/2023   MONOABS 0.4 10/17/2023   EOSABS 0.7 (H) 10/17/2023   BASOSABS 0.1 10/17/2023     Last metabolic panel Lab Results  Component Value Date   NA 137 11/02/2023   K 4.0 11/02/2023   CL 96 (L) 11/02/2023   CO2 28 11/02/2023   BUN 31 (H) 11/02/2023   CREATININE 4.62 (H) 11/02/2023   GLUCOSE 190 (H) 11/02/2023   GFRNONAA 15 (L) 11/02/2023   GFRAA 7 (L) 05/30/2020   CALCIUM  9.0 11/02/2023   PHOS 5.5 (H) 11/02/2023   PROT 7.7 10/19/2023   PROT 7.8 10/19/2023   ALBUMIN  3.5 11/02/2023   BILITOT 1.3 (H) 10/19/2023   BILITOT 1.3 (H) 10/19/2023   ALKPHOS 150 (H) 10/19/2023   ALKPHOS 150 (H) 10/19/2023   AST 20 10/19/2023   AST 19 10/19/2023   ALT 10 10/19/2023   ALT 8 10/19/2023   ANIONGAP 13 11/02/2023    GFR: Estimated  Creatinine Clearance: 22.8 mL/min (A) (by C-G formula based on SCr of 4.62 mg/dL (H)).  No results found for this or any previous visit (from the past 240 hours).    Radiology Studies: No results found.    LOS: 55 days    Elgin Lam, MD [facility] 11/03/2023, 9:49 AM   If 7PM-7AM, please contact night-coverage [email or web address]

## 2023-11-03 NOTE — Plan of Care (Signed)
  Problem: Clinical Measurements: Goal: Ability to maintain clinical measurements within normal limits will improve Outcome: Progressing Goal: Will remain free from infection Outcome: Progressing Goal: Diagnostic test results will improve Outcome: Progressing Goal: Cardiovascular complication will be avoided Outcome: Progressing   Problem: Skin Integrity: Goal: Risk for impaired skin integrity will decrease Outcome: Progressing   Problem: Health Behavior/Discharge Planning: Goal: Ability to manage health-related needs will improve Outcome: Not Progressing Non compliance c fluid restriction. PRN med use.    Problem: Activity: Goal: Risk for activity intolerance will decrease Outcome: Not Progressing  Declines when PT/OT available.   Problem: Coping: Goal: Level of anxiety will decrease Outcome: Not Progressing

## 2023-11-03 NOTE — Progress Notes (Signed)
 [facility] Progress Note   Subjective:    Seen and examined patient at bedside. Per notes, patient was upset that he wasn't pre-medicated for pain control prior to HD. Unfortunately, he didn't reach max UF yesterday. Awaiting SNF placement. Next HD 11/04/23.   Objective Vitals:   11/02/23 1749 11/02/23 1900 11/03/23 0500 11/03/23 0742  BP:  (!) 138/96 (!) 137/98 135/76  Pulse:  (!) 110 (!) 110 99  Resp:  18 18 18   Temp:  98.9 F (37.2 C) 98.7 F (37.1 C) 98.5 F (36.9 C)  TempSrc:  Oral Oral   SpO2:  97% 98%   Weight: 98.4 kg     Height:       Physical Exam General: Chronically ill appearing male in NAD Heart: S1,S2 RRR No M/R/G Lungs: Clear anteriorly. Breathing unlabored Abdomen:NABS, NT Extremities: L AKA no stump edema, no RLE edema Dialysis Access: L femoral TDC Drsg intact  Filed Weights   10/30/23 1334 11/02/23 1345 11/02/23 1749  Weight: 96.3 kg 98.9 kg 98.4 kg    Intake/Output Summary (Last 24 hours) at 11/03/2023 1310 Last data filed at 11/03/2023 0932 Gross per 24 hour  Intake 840 ml  Output 500 ml  Net 340 ml    Additional Objective Labs: Basic Metabolic Panel: Recent Labs  Lab 10/30/23 0927 11/01/23 2057 11/02/23 1511  NA 138 137 137  K 4.9 5.5* 4.0  CL 98 96* 96*  CO2 28 24 28   GLUCOSE 109* 325* 190*  BUN 38* 46* 31*  CREATININE 5.94* 6.30* 4.62*  CALCIUM  9.4 9.0 9.0  PHOS 6.8* 7.9* 5.5*   Liver Function Tests: Recent Labs  Lab 10/30/23 0927 11/01/23 2057 11/02/23 1511  ALBUMIN  3.5 3.5 3.5   No results for input(s): LIPASE, AMYLASE in the last 168 hours. CBC: Recent Labs  Lab 10/28/23 0356 10/30/23 0927 11/01/23 2057 11/02/23 1029  WBC 4.8 4.4 5.8 4.7  HGB 8.6* 8.4* 9.5* 8.8*  HCT 29.2* 28.6* 33.0* 30.6*  MCV 94.2 94.4 95.7 94.7  PLT 158 155 141* 143*   Blood Culture    Component Value Date/Time   SDES BLOOD SITE NOT SPECIFIED 08/25/2023 1706   SPECREQUEST  08/25/2023 1706    BOTTLES DRAWN AEROBIC  ONLY Blood Culture results may not be optimal due to an inadequate volume of blood received in culture bottles   CULT  08/25/2023 1706    NO GROWTH 5 DAYS Performed at [facility], [address], [place], [name] [address]    REPTSTATUS 08/30/2023 FINAL 08/25/2023 1706    Cardiac Enzymes: No results for input(s): CKTOTAL, CKMB, CKMBINDEX, TROPONINI in the last 168 hours. CBG: Recent Labs  Lab 11/02/23 1217 11/02/23 1808 11/02/23 1934 11/03/23 0740 11/03/23 1128  GLUCAP 275* 117* 282* 194* 189*   Iron  Studies: No results for input(s): IRON , TIBC, TRANSFERRIN, FERRITIN in the last 72 hours. Lab Results  Component Value Date   INR 1.2 07/22/2020   INR 1.0 05/30/2020   INR 1.2 04/24/2019   Studies/Results: No results found.  Medications:   (feeding supplement) PROSource Plus  30 mL Oral BID BM   apixaban   5 mg Oral BID   bacitracin    Topical Daily   Chlorhexidine  Gluconate Cloth  6 each Topical Q0600   darbepoetin (ARANESP ) injection - DIALYSIS  100 mcg Subcutaneous Q Sun-1800   gabapentin   100 mg Oral TID   Gerhardt's butt cream   Topical BID   insulin  aspart  0-6 Units Subcutaneous TID WC  insulin  glargine-yfgn  4 Units Subcutaneous Daily   loratadine   10 mg Oral Daily   midodrine   10 mg Oral Q T,Th,Sa-HD   pantoprazole   40 mg Oral Daily   povidone-Iodine    Topical Q0600   QUEtiapine   12.5 mg Oral q morning   QUEtiapine   25 mg Oral QHS   senna-docusate  2 tablet Oral BID   sevelamer  carbonate  2,400 mg Oral TID WC   sodium zirconium cyclosilicate   10 g Oral TID    Dialysis Orders: MWF - [facility] -> may be will be going [facility] on [facility] 4[facility], 400/A1.5, EDW 77kg, L femoral TDC, 2K/3Ca bath, no heparin   Assessment/Plan: MRSA bacteremia with MV/TV endocarditis + brain abscess:  Not a candidate for valve surgery, S/p line holiday and brain abscess drainage (at [facility]). Continues on extended Vanc course - will be on Vanc 750mcg IV  q HD until 10/27/23. S/p transfer to [facility] and drainage 08/31/24. Repeat brain MRI 10/17/23 showed improvement. Acute B CVA/septic emboli- due to above Acute LUE DVT: On Eliquis  ESRD: Transitioned to TTS. Next HD 11/04/23. HTN/volume: Didn't reach max UF yesterday 2nd low Bps from pain medication and signing off early. CT 10/09/23 with diffuse anasarca. Significantly above EDW but using bed weights. Max UF with HD. Will try UFG 3-4L for tomorrow. On Midodrine  with HD and okay to use IV Albumin  PRN. Enforcing fluid restrictions/compliance with full HD time. Anemia of ESRD: Hgbs 8-9s. Will raise Aranesp  to 150mcg. Will repeat another iron  panel in AM. Will consider IV Fe since patient appears to be off ABXs. Secondary HPTH: Continue to hold calcitriol  for now. Sevelamer  increased to 3/meals.  Continues to have outside food and refuses any diet except regular (chronic problem with him despite counseling). Checking labs in AM. Hyperkalemia: K+ now stable. Continue Lokelma  10g TID for now, using low bath intermittently with HD.  As noted above diet indiscretion despite counseling Nutrition: Alb 3.5  continue supplements.  T1DM: per primary Dispo: SNF being arranged - not finalized yet  Charmaine Piety, NP [facility] 11/03/2023,1:10 PM  LOS: 55 days

## 2023-11-03 NOTE — Progress Notes (Signed)
 Occupational Therapy Treatment Patient Details Name: [name] MRN: [id number] DOB: [birth year] Today's Date: 11/03/2023   History of present illness 43 y.o. male presents to [facility] 09/09/23 as a transfer from [facility] for further management of endocarditis. Pt originally was admitted to [facility] 07/09/23 for DKA and sepsis after being found on the ground. 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 10/17 pt had a R MCA, L occipital, and B cerebellum CVA w/ septic emboli. ICU admit 10/18 w/ intubation 10/18-10/24. Pt was transferred to [facility] for [facility] had cardiac cath w/ non obstructive disease, SAH, a craniotomy w/ abscess drainage on 12/4, and R third toe distal necrosis. PMH: chronic L AKA, MRSA, IDDM, ESRD on HD MWF, HTN, TEE positive for vegetation on multiple valves, cellulitis, medical noncompliance, narcotic dependence, DM with gastroparesis, L internal jugular DVT.   OT comments  OT session focused on training in L UE PROM/self-ROM therapeutic exercise (see details below) to address edema and maintain joint integrity. OT also educated pt in positioning of L UE to address edema. Pt verbalized and demonstrated understanding of all education through teach back with cues needed to maintain attention to task. Pt will benefit from reinforcement of all education. Pt participated well in session and is making slow but consistent progress toward goals. Pt will benefit from continued acute skilled OT services to address deficits outlined below and increase safety and independence with functional tasks. Post acute discharge, pt will benefit from intensive inpatient skilled rehab services to maximize rehab potential.       If plan is discharge home, recommend the following:  Two people to help with walking and/or transfers;Two people to help with bathing/dressing/bathroom;Assistance with feeding;Direct supervision/assist for financial  management;Direct supervision/assist for medications management;Assist for transportation;Help with stairs or ramp for entrance;Assistance with cooking/housework (set-up for self-feeding)   Equipment Recommendations  Wheelchair (measurements OT);Wheelchair cushion (measurements OT);Hospital bed;Hoyer lift    Recommendations for Other Services      Precautions / Restrictions Precautions Precautions: Fall;Other (comment) Precaution Comments: L AKA, L hemiparesis and edema, L neglect, bowel incontinence. L femoral HD cath Restrictions Weight Bearing Restrictions Per Provider Order: No LLE Weight Bearing Per Provider Order: Non weight bearing Other Position/Activity Restrictions: Pt has sling for LUE when mobilizing OOB; LLE prosthetic not fitting       Mobility Bed Mobility Overal bed mobility: Needs Assistance Bed Mobility: Rolling Rolling: Mod assist         General bed mobility comments: pt declined sitting EOB or OOB activities this session due to having PT earlier this day    Transfers Overall transfer level: Needs assistance                 General transfer comment: pt declined sitting EOB or OOB activities this session due to having PT earlier this day     Balance                                           ADL either performed or assessed with clinical judgement   ADL Overall ADL's : Needs assistance/impaired Eating/Feeding: Set up;Bed level (with HOB elevated)  General ADL Comments: Largely not addressed this session. Pt's tray arriving at end of session with pt demonstrating ability to self-feed with Set up.    Extremity/Trunk Assessment Upper Extremity Assessment Upper Extremity Assessment: Right hand dominant;LUE deficits/detail LUE Deficits / Details: edematous L UE from shoulder to hand; pt hand with clawing position at rest; hemiparesis LUE Sensation: decreased light  touch;decreased proprioception LUE Coordination: decreased fine motor;decreased gross motor   Lower Extremity Assessment Lower Extremity Assessment: Defer to PT evaluation        Vision       Perception     Praxis      Cognition Arousal: Alert Behavior During Therapy: WFL for tasks assessed/performed Overall Cognitive Status: Impaired/Different from baseline (No family/caregiver present this day to confirm baseline) Area of Impairment: Attention, Memory, Following commands, Problem solving                   Current Attention Level: Selective Memory: Decreased short-term memory Following Commands: Follows one step commands with increased time, Follows multi-step commands inconsistently, Follows multi-step commands with increased time Safety/Judgement: Decreased awareness of deficits, Decreased awareness of safety Awareness: Emergent Problem Solving: Slow processing, Difficulty sequencing, Requires verbal cues General Comments: Pt benefits from increased time and cues to maintain attention to task. Pt also requiring ongoing education regarding importance of addressing L UE deficitis for increased safety and independence with functional tasks, including pt's personal goal or being able to walk using his prosthetic and a RW.        Exercises Exercises: General Upper Extremity, Hand exercises General Exercises - Upper Extremity Shoulder Flexion: PROM, Left, 10 reps, Supine (with HOB elevated; in address edema and maintain joint integrity) Shoulder Extension: PROM, Left, 10 reps, Supine (with HOB elevated; in address edema and maintain joint integrity) Elbow Flexion: PROM, Self ROM, Left, Other reps (comment), Supine (5 reps PROM; 5 reps self-ROM; with HOB elevated; in address edema and maintain joint integrity) Elbow Extension: PROM, Self ROM, Left, Other reps (comment), Supine (5 reps PROM; 5 reps self-ROM; with HOB elevated; in address edema and maintain joint  integrity) Wrist Flexion: PROM, Self ROM, Left, Other reps (comment), Supine (5 reps PROM; 5 reps self-ROM; with HOB elevated; in address edema and maintain joint integrity) Wrist Extension: PROM, Self ROM, Both, Other reps (comment), Supine (5 reps PROM; 5 reps self-ROM; with HOB elevated; in address edema and maintain joint integrity) Digit Composite Flexion: PROM, Self ROM, Left, Other reps (comment), Supine (5 reps PROM; 5 reps self-ROM; with HOB elevated; in address edema and maintain joint integrity) Composite Extension: PROM, Self ROM, Left, Supine, Other (comment) (5 reps PROM; 5 reps self-ROM; with HOB elevated; in address edema and maintain joint integrity) Hand Exercises Forearm Supination: PROM, Left, 10 reps, Supine (with HOB elevated; in address edema and maintain joint integrity) Forearm Pronation: PROM, Left, 10 reps, Supine (with HOB elevated; in address edema and maintain joint integrity) Wrist Ulnar Deviation: PROM, Left, 10 reps, Supine (with HOB elevated; in address edema and maintain joint integrity) Wrist Radial Deviation: PROM, Left, 10 reps, Supine (with HOB elevated; in address edema and maintain joint integrity)    Shoulder Instructions       General Comments Upon OT arrival, pt reports he wants to walk with is prosthetic again but does not want to get EOB or OOB this session due to working with PT earlier this day. OT reeducated pt in importance of addressing L UE deficits to increase safety and indpendence with functional  tasks with pt agreeable to education/participation in L UE PROM/self-ROM and positioning L UE for improved edema management. At end of session, pt's L UE elevated on pillow with pt reproting L UE was comfortable.    Pertinent Vitals/ Pain       Pain Assessment Pain Assessment: Faces Faces Pain Scale: No hurt Pain Intervention(s): Monitored during session  Home Living                                          Prior  Functioning/Environment              Frequency  Min 1X/week        Progress Toward Goals  OT Goals(current goals can now be found in the care plan section)  Progress towards OT goals: Progressing toward goals  Acute Rehab OT Goals Patient Stated Goal: to walk using his prosthetic and a RW  Plan      Co-evaluation                 AM-PAC OT 6 Clicks Daily Activity     Outcome Measure   Help from another person eating meals?: A Little Help from another person taking care of personal grooming?: A Little Help from another person toileting, which includes using toliet, bedpan, or urinal?: A Little Help from another person bathing (including washing, rinsing, drying)?: A Lot Help from another person to put on and taking off regular upper body clothing?: A Little Help from another person to put on and taking off regular lower body clothing?: A Lot 6 Click Score: 16    End of Session    OT Visit Diagnosis: Other symptoms and signs involving cognitive function;Hemiplegia and hemiparesis Hemiplegia - Right/Left: Left Hemiplegia - dominant/non-dominant: Non-Dominant Hemiplegia - caused by: Cerebral infarction   Activity Tolerance Patient tolerated treatment well   Patient Left in bed;with bed alarm set;with call bell/phone within reach   Nurse Communication Mobility status;Other (comment) (OT educated pt in self-ROM exercises for L UE to address edema and maintain joint integrity)        Time: 8361-8344 OT Time Calculation (min): 17 min  Charges: OT General Charges $OT Visit: 1 Visit OT Treatments $Therapeutic Exercise: 8-22 mins  Margarie Rockey HERO., OTR/L, MA Acute Rehab [phone]   Margarie FORBES Horns 11/03/2023, 6:21 PM

## 2023-11-04 LAB — GLUCOSE, CAPILLARY
Glucose-Capillary: 137 mg/dL — ABNORMAL HIGH (ref 70–99)
Glucose-Capillary: 151 mg/dL — ABNORMAL HIGH (ref 70–99)
Glucose-Capillary: 136 mg/dL — ABNORMAL HIGH (ref 70–99)
Glucose-Capillary: 304 mg/dL — ABNORMAL HIGH (ref 70–99)

## 2023-11-04 LAB — CBC WITH DIFFERENTIAL/PLATELET
Abs Immature Granulocytes: 0.03 10*3/uL (ref 0.00–0.07)
Basophils Absolute: 0 10*3/uL (ref 0.0–0.1)
Basophils Relative: 1 %
Eosinophils Absolute: 0.4 10*3/uL (ref 0.0–0.5)
Eosinophils Relative: 9 %
HCT: 29.4 % — ABNORMAL LOW (ref 39.0–52.0)
Hemoglobin: 8.3 g/dL — ABNORMAL LOW (ref 13.0–17.0)
Immature Granulocytes: 1 %
Lymphocytes Relative: 18 %
Lymphs Abs: 0.7 10*3/uL (ref 0.7–4.0)
MCH: 26.8 pg (ref 26.0–34.0)
MCHC: 28.2 g/dL — ABNORMAL LOW (ref 30.0–36.0)
MCV: 94.8 fL (ref 80.0–100.0)
Monocytes Absolute: 0.3 10*3/uL (ref 0.1–1.0)
Monocytes Relative: 8 %
Neutro Abs: 2.6 10*3/uL (ref 1.7–7.7)
Neutrophils Relative %: 63 %
Platelets: 126 10*3/uL — ABNORMAL LOW (ref 150–400)
RBC: 3.1 MIL/uL — ABNORMAL LOW (ref 4.22–5.81)
RDW: 16.7 % — ABNORMAL HIGH (ref 11.5–15.5)
WBC: 4 10*3/uL (ref 4.0–10.5)
nRBC: 0 % (ref 0.0–0.2)

## 2023-11-04 LAB — RENAL FUNCTION PANEL
Albumin: 3.2 g/dL — ABNORMAL LOW (ref 3.5–5.0)
Anion gap: 19 — ABNORMAL HIGH (ref 5–15)
BUN: 41 mg/dL — ABNORMAL HIGH (ref 6–20)
CO2: 27 mmol/L (ref 22–32)
Calcium: 9.7 mg/dL (ref 8.9–10.3)
Chloride: 94 mmol/L — ABNORMAL LOW (ref 98–111)
Creatinine, Ser: 6.21 mg/dL — ABNORMAL HIGH (ref 0.61–1.24)
GFR, Estimated: 11 mL/min — ABNORMAL LOW (ref >60)
Glucose, Bld: 146 mg/dL — ABNORMAL HIGH (ref 70–99)
Phosphorus: 7.2 mg/dL — ABNORMAL HIGH (ref 2.5–4.6)
Potassium: 4.5 mmol/L (ref 3.5–5.1)
Sodium: 140 mmol/L (ref 135–145)

## 2023-11-04 LAB — FERRITIN: Ferritin: 140 ng/mL (ref 24–336)

## 2023-11-04 LAB — IRON AND TIBC
Iron: 31 ug/dL — ABNORMAL LOW (ref 45–182)
Saturation Ratios: 15 % — ABNORMAL LOW (ref 17.9–39.5)
TIBC: 214 ug/dL — ABNORMAL LOW (ref 250–450)
UIBC: 183 ug/dL

## 2023-11-04 MED ORDER — LIDOCAINE-PRILOCAINE 2.5-2.5 % EX CREA: 1.0000 | TOPICAL_CREAM | CUTANEOUS | Status: DC | PRN

## 2023-11-04 MED ORDER — ALTEPLASE 2 MG IJ SOLR: 2.0000 mg | Freq: Once | INTRAMUSCULAR | Status: DC | PRN

## 2023-11-04 MED ORDER — PENTAFLUOROPROP-TETRAFLUOROETH EX AERO: 1.0000 | INHALATION_SPRAY | CUTANEOUS | Status: DC | PRN

## 2023-11-04 MED ORDER — HEPARIN SODIUM (PORCINE) 1000 UNIT/ML DIALYSIS
1000.0000 [IU] | INTRAMUSCULAR | Status: DC | PRN
  Administered 2023-11-04: 4600 [IU] via INTRAVENOUS_CENTRAL
  Filled 2023-11-04 (×2): qty 1

## 2023-11-04 MED ORDER — SODIUM CHLORIDE 0.9 % IV SOLN
100.0000 mg | INTRAVENOUS | Status: DC
  Administered 2023-11-06 – 2023-11-23 (×8): 100 mg via INTRAVENOUS
  Filled 2023-11-04 (×8): qty 5

## 2023-11-04 MED ORDER — LIDOCAINE HCL (PF) 1 % IJ SOLN: 5.0000 mL | INTRAMUSCULAR | Status: DC | PRN

## 2023-11-04 MED ORDER — MIDODRINE HCL 5 MG PO TABS
10.0000 mg | ORAL_TABLET | Freq: Once | ORAL | Status: AC
  Administered 2023-11-04: 10 mg via ORAL

## 2023-11-04 NOTE — Procedures (Signed)
 Received patient in bed to unit.  Alert and oriented.  Informed consent signed and in chart.   TX duration: 3.5 hours  Patient tolerated well.  Transported back to the room  Alert, without acute distress.  Hand-off given to patient's nurse.   Access used: left femoral cath Access issues: none  Total UF removed: 4 liters Medication(s) given: midodrine    Greig Silvan, RN [facility]

## 2023-11-04 NOTE — Progress Notes (Signed)
 [facility] Progress Note   Subjective:    Seen and examined patient on HD. Currently sleeping. Tolerating UFG 4L so far. BP is 98/51 and ordered an extra Midodrine  dose and IV Albumin  if needed. Awaiting SNF placement.  Objective Vitals:   11/04/23 0818 11/04/23 1317 11/04/23 1330 11/04/23 1430  BP: 109/75 106/76 115/73 (!) 98/51  Pulse: 61 (!) 112 (!) 113 (!) 113  Resp: 18 13 (!) 9 11  Temp: 98.5 F (36.9 C)     TempSrc: Oral     SpO2: 98% 95% 91% 96%  Weight:  99 kg    Height:       Physical Exam General: Chronically ill appearing male in NAD Heart: S1,S2 RRR No M/R/G Lungs: Clear anteriorly. Breathing unlabored Abdomen:NABS, NT Extremities: L AKA no stump edema, no RLE edema Dialysis Access: L femoral TDC Drsg intact  Filed Weights   11/02/23 1345 11/02/23 1749 11/04/23 1317  Weight: 98.9 kg 98.4 kg 99 kg   No intake or output data in the 24 hours ending 11/04/23 1443  Additional Objective Labs: Basic Metabolic Panel: Recent Labs  Lab 11/01/23 2057 11/02/23 1511 11/04/23 1336  NA 137 137 140  K 5.5* 4.0 4.5  CL 96* 96* 94*  CO2 24 28 27   GLUCOSE 325* 190* 146*  BUN 46* 31* 41*  CREATININE 6.30* 4.62* 6.21*  CALCIUM  9.0 9.0 9.7  PHOS 7.9* 5.5* 7.2*   Liver Function Tests: Recent Labs  Lab 11/01/23 2057 11/02/23 1511 11/04/23 1336  ALBUMIN  3.5 3.5 3.2*   No results for input(s): LIPASE, AMYLASE in the last 168 hours. CBC: Recent Labs  Lab 10/30/23 0927 11/01/23 2057 11/02/23 1029 11/04/23 1336  WBC 4.4 5.8 4.7 4.0  NEUTROABS  --   --   --  2.6  HGB 8.4* 9.5* 8.8* 8.3*  HCT 28.6* 33.0* 30.6* 29.4*  MCV 94.4 95.7 94.7 94.8  PLT 155 141* 143* 126*   Blood Culture    Component Value Date/Time   SDES BLOOD SITE NOT SPECIFIED 08/25/2023 1706   SPECREQUEST  08/25/2023 1706    BOTTLES DRAWN AEROBIC ONLY Blood Culture results may not be optimal due to an inadequate volume of blood received in culture bottles   CULT   08/25/2023 1706    NO GROWTH 5 DAYS Performed at [facility], [address] [place], [name] [address]    REPTSTATUS 08/30/2023 FINAL 08/25/2023 1706    Cardiac Enzymes: No results for input(s): CKTOTAL, CKMB, CKMBINDEX, TROPONINI in the last 168 hours. CBG: Recent Labs  Lab 11/03/23 1128 11/03/23 1617 11/03/23 2027 11/04/23 0724 11/04/23 1121  GLUCAP 189* 157* 367* 137* 151*   Iron  Studies:  Recent Labs    11/04/23 1336  IRON  31*  TIBC 214*  FERRITIN 140   Lab Results  Component Value Date   INR 1.2 07/22/2020   INR 1.0 05/30/2020   INR 1.2 04/24/2019   Studies/Results: No results found.  Medications:   (feeding supplement) PROSource Plus  30 mL Oral BID BM   apixaban   5 mg Oral BID   bacitracin    Topical Daily   Chlorhexidine  Gluconate Cloth  6 each Topical Q0600   [START ON 11/07/2023] darbepoetin (ARANESP ) injection - DIALYSIS  150 mcg Subcutaneous Q Sun-1800   gabapentin   100 mg Oral TID   Gerhardt's butt cream   Topical BID   insulin  aspart  0-6 Units Subcutaneous TID WC   insulin  glargine-yfgn  4 Units Subcutaneous Daily  loratadine   10 mg Oral Daily   midodrine   10 mg Oral Q T,Th,Sa-HD   pantoprazole   40 mg Oral Daily   povidone-Iodine    Topical Q0600   QUEtiapine   12.5 mg Oral q morning   QUEtiapine   25 mg Oral QHS   senna-docusate  2 tablet Oral BID   sevelamer  carbonate  2,400 mg Oral TID WC   sodium zirconium cyclosilicate   10 g Oral TID    Dialysis Orders: MWF - [facility] -> may be will be [facility] 4[facility], 400/A1.5, EDW 77kg, L femoral TDC, 2K/3Ca bath, no heparin   Assessment/Plan: 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 Continues on extended Vanc course - will be on Vanc 750mcg IV q HD until 10/27/23. S/p transfer to [facility] and drainage 08/31/24. Repeat brain MRI 10/17/23 showed  improvement. Acute B CVA/septic emboli- due to above Acute LUE DVT: On Eliquis  ESRD: Transitioned to TTS. On HD. HTN/volume: Didn't reach max UF 2/4 2nd low Bps from pain medication and signing off early. CT 10/09/23 with diffuse anasarca. Significantly above EDW but using bed weights. Max UF with HD. Trying UFG 3-4L with HD today. On Midodrine  with HD and okay to use IV Albumin  and extra Midodrine  dose PRN. Enforcing fluid restrictions/compliance with full HD time. Anemia of ESRD: Hgbs 8-9s. Raised Aranesp  to . Iron  studies from today include: Iron  31, Tsat 15%, and Ferritin 140. Will order Fe load X 10 doses with HD. Secondary HPTH: Continue to hold calcitriol  for now. Sevelamer  increased to 3/meals.  Continues to have outside food and refuses any diet except regular (chronic problem with him despite counseling). Checking labs in AM. Hyperkalemia: K+ now stable. Continue Lokelma  10g TID for now, using low bath intermittently with HD.  As noted above diet indiscretion despite counseling Nutrition: Alb 3.5  continue supplements.  T1DM: per primary Dispo: SNF being arranged - not finalized yet  Charmaine Piety, NP [facility] 11/04/2023,2:43 PM  LOS: 56 days

## 2023-11-04 NOTE — Procedures (Signed)
 Spoke with [name] about patient bp 98/51. Gave me the okay to go ahead and give 10 mg of midodrine .

## 2023-11-04 NOTE — Progress Notes (Signed)
 Progress Note   Patient: [name] FMW:[id number] DOB: Jun 02, 1981 DOA: 09/09/2023     56 DOS: the patient was seen and examined on 11/04/2023   Brief hospital course: [name] is a 43 y.o. male with medical history significant of ESRD on hemodialysis (not fully compliant), insulin -dependent type 1 diabetes, gastroparesis, Left AKA, admitted to [facility] on 07/09/23 for DKA, Sepsis and hospital course significant for MV AND TV  MRSA endocarditis, followed by acute MCA stroke , left occipital and bilateral cerebellar strokes consistent with septic emboli,  Brain abscess from MRSA, left IJ DVT was transferred to [facility] for MV repair on 11/29.  While at [facility] he underwent cardiac cath showing non obstructive disease, craniotomy with abscess drainage on 09/01/23,. He had subarachnoid hemorrhage while on IV heparin , right third toe distal necrosis,was seen by podiatry, but family wanted conservative management. Since his tertiary care needs have been completed, he was transferred back to [facility] for further management.  PTOT  recommending SNF and awaiting placement.  Assessment and Plan:  MRSA TV/MV endocarditis -Positive MRSA blood cultures. Diagnosed on Transesophageal Echocardiogram. Patient managed on Vancomycin . At outside hospital, patient underwent a left heart catheterization which was significant for nonobstructive disease. Infectious disease recommendations to continue Vancomycin  IV and patient has completed his 8 week treatment course.   Prostate fluid collection/abscess -Urology consulted at outside hospital and per their assessment, fluid collection was secondary to dilated seminal vesicle with no surgical intervention recommended. Infectious disease with continued concern for possible abscess. Patient covered with vancomycin  with recommendation for outpatient urology follow-up.   CNS emboli with abscess Cerebritis -Secondary to MRSA endocarditis. Patient evaluated by neurosurgery. Concern  for abscess ruled out by neurosurgery initially. No surgical intervention per neurosurgery. However, at outside hospital, patient was evaluated by neurosurgery who performed a craniotomy for abscess drainage.   ESRD on HD -Nephrology consulted and managing hemodialysis.   Hyperkalemia -Improved with HD.   Anemia of chronic kidney disease -Hemoglobin stable.   CKD mineral bone disease -Continue calcitriol    Acute CVA -MRI confirms large right MCA branch infarct. MRA head (11/25) significant for large necrotic appearing lesion within the right frontal lobe within area of the prior right MCA territory infarct with vasogenic edema and 8 mm leftward midline shift. LDL of 34. Hemoglobin A1C of 8.9%. Transthoracic Echocardiogram (10/13) significant for an LVEF of 25-30% with concern for AV/MV abnormality with Transesophageal Echocardiogram (10/16 & 10/22) significant for MV/TV vegetations. Neurology recommendations for Eliquis . PT/OT recommendations for SNF.   Primary hypertension -Patient is on metoprolol  as an outpatient. Metoprolol  held.   Acute left upper extremity DVT -Continue Eliquis    Right third toe necrosis -Noted. Per documentation, patient/family opting for conservative management and decline surgical management.   Diabetes mellitus type 1 -Poorly controlled with hyperglycemia and hypoglycemia. Hemoglobin A1C of 8.9%. Seems mostly due to continued poor dietary choices. Partly iatrogenic secondary to attempting to adjust regimen for dietary indiscretions. -Continue Semglee  4 units daily and continue SSI   Gastroparesis -Noted. Patient is not on medication management apart from insulin  for blood sugar control. Patient without symptoms and is tolerating his diet.   History of left BKA -Noted. Patient seen by PT/OT with recommendation for SNF on discharge.   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 Patient started on Seroquel  for behavioral change management.    Overweight -Estimated body mass index is 33.98 kg/m as calculated from the following:   Height as of this encounter: 5' 7 (1.702 m).  Weight as of this encounter: 98.4 kg.   Pressure injury -Left buttock. Not present on admission.  Continue offloading, dressing change.      Subjective: Patient seen before his dialysis visit today.  Requesting increased pain medication secondary to his pressure injury on his buttocks.  Otherwise denies fever, chills, chest pain, nausea, vomiting, abdominal pain.  Appears to be in good spirits otherwise.  Physical Exam: Vitals:   11/04/23 0501 11/04/23 0818 11/04/23 1317 11/04/23 1330  BP: (!) 142/89 109/75 106/76 115/73  Pulse: (!) 113 61 (!) 112 (!) 113  Resp: 18 18 13  (!) 9  Temp: 97.8 F (36.6 C) 98.5 F (36.9 C)    TempSrc: Oral Oral    SpO2: 99% 98% 95% 91%  Weight:   99 kg   Height:       GENERAL:  Alert, pleasant, no acute distress, disheveled HEENT:  EOMI CARDIOVASCULAR:  RRR, no murmurs appreciated RESPIRATORY:  Clear to auscultation, no wheezing, rales, or rhonchi GASTROINTESTINAL:  Soft, nontender, nondistended EXTREMITIES: Left AKA NEURO:  No new focal deficits appreciated SKIN: Scattered excoriations in various stages of healing  PSYCH:  Appropriate mood and affect   Data Reviewed:  There are no new results to review at this time.  Family Communication: No family at bedside  Disposition: Status is: Inpatient Remains inpatient appropriate because: Continued hemodialysis  Planned Discharge Destination: Skilled nursing facility    Time spent: 35 minutes  Author: Carliss LELON Canales, DO 11/04/2023 1:49 PM  For on call review [email or web address].

## 2023-11-05 LAB — GLUCOSE, CAPILLARY
Glucose-Capillary: 392 mg/dL — ABNORMAL HIGH (ref 70–99)
Glucose-Capillary: 297 mg/dL — ABNORMAL HIGH (ref 70–99)
Glucose-Capillary: 147 mg/dL — ABNORMAL HIGH (ref 70–99)

## 2023-11-05 MED ORDER — CHLORHEXIDINE GLUCONATE CLOTH 2 % EX PADS
6.0000 | MEDICATED_PAD | Freq: Every day | CUTANEOUS | Status: DC
  Administered 2023-11-05 – 2023-11-07 (×3): 6 via TOPICAL

## 2023-11-05 NOTE — Progress Notes (Signed)
 Occupational Therapy Treatment Patient Details Name: [name] MRN: [id number] DOB: Aug 08, 1981 Today's Date: 11/05/2023   History of present illness 43 y.o. male presents to [facility] 09/09/23 as a transfer from [facility] for further management of endocarditis. Pt originally was admitted to [facility] 07/09/23 for DKA and sepsis after being found on the ground. 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 10/17 pt had a R MCA, L occipital, and B cerebellum CVA w/ septic emboli. ICU admit 10/18 w/ intubation 10/18-10/24. Pt was transferred to [facility] for MV repair on 11/29. At [facility], pt had cardiac cath w/ non obstructive disease, SAH, a craniotomy w/ abscess drainage on 12/4, and R third toe distal necrosis. PMH: chronic L AKA, MRSA, IDDM, ESRD on HD MWF, HTN, TEE positive for vegetation on multiple valves, cellulitis, medical noncompliance, narcotic dependence, DM with gastroparesis, L internal jugular DVT.   OT comments  Pt. Seen for skilled OT treatment for continued work on LUE HEP.  Pt. Guided through PROM LUE, and able to return demo of self ROM with use of RUE assisting LUE.  Assisted with pillow placement for LUE elevation as pt. Does not like the other elevation positioning device in his room.  Cont. With current acute OT POC.        If plan is discharge home, recommend the following:  Two people to help with walking and/or transfers;Two people to help with bathing/dressing/bathroom;Assistance with feeding;Direct supervision/assist for financial management;Direct supervision/assist for medications management;Assist for transportation;Help with stairs or ramp for entrance;Assistance with cooking/housework   Equipment Recommendations       Recommendations for Other Services Speech consult;PT consult    Precautions / Restrictions Precautions Precaution Comments: L AKA, L hemiparesis and edema, L neglect, bowel incontinence. L femoral HD cath Restrictions LLE  Weight Bearing Per Provider Order: Non weight bearing Other Position/Activity Restrictions: Pt has sling for LUE when mobilizing OOB; LLE prosthetic not fitting       Mobility Bed Mobility                    Transfers                         Balance                                           ADL either performed or assessed with clinical judgement   ADL                                              Extremity/Trunk Assessment              Vision       Perception     Praxis      Cognition                                                Exercises General Exercises - Upper Extremity Shoulder Flexion: PROM, Left, 10 reps, Supine Shoulder Extension: PROM, Left, 10 reps, Supine Elbow Flexion: PROM, Self ROM, Left, Other reps (comment), Supine Elbow Extension: PROM,  Self ROM, Left, Other reps (comment), Supine Wrist Flexion: PROM, Self ROM, Left, Other reps (comment), Supine Wrist Extension: PROM, Self ROM, Both, Other reps (comment), Supine Digit Composite Flexion: PROM, Self ROM, Left, Other reps (comment), Supine Composite Extension: PROM, Self ROM, Left, Supine, Other (comment)    Shoulder Instructions       General Comments      Pertinent Vitals/ Pain          Home Living                                          Prior Functioning/Environment              Frequency  Min 1X/week        Progress Toward Goals  OT Goals(current goals can now be found in the care plan section)  Progress towards OT goals: Progressing toward goals     Plan      Co-evaluation                 AM-PAC OT 6 Clicks Daily Activity     Outcome Measure   Help from another person eating meals?: A Little Help from another person taking care of personal grooming?: A Little Help from another person toileting, which includes using toliet, bedpan, or urinal?: A  Little Help from another person bathing (including washing, rinsing, drying)?: A Lot Help from another person to put on and taking off regular upper body clothing?: A Little Help from another person to put on and taking off regular lower body clothing?: A Lot 6 Click Score: 16    End of Session    OT Visit Diagnosis: Other symptoms and signs involving cognitive function;Hemiplegia and hemiparesis Hemiplegia - Right/Left: Left Hemiplegia - dominant/non-dominant: Non-Dominant Hemiplegia - caused by: Cerebral infarction   Activity Tolerance Patient tolerated treatment well   Patient Left in bed;with call bell/phone within reach;with bed alarm set   Nurse Communication Other (comment) (rn present at beginning of session giving meds)        Time: 8696-8681 OT Time Calculation (min): 15 min  Charges: OT General Charges $OT Visit: 1 Visit OT Treatments $Therapeutic Exercise: 8-22 mins  Randall, COTA/L Acute Rehabilitation [phone]   CHRISTELLA Nest Lorraine-COTA/L 11/05/2023, 1:19 PM

## 2023-11-05 NOTE — Plan of Care (Signed)
  Problem: Health Behavior/Discharge Planning: Goal: Ability to manage health-related needs will improve Outcome: Progressing   Problem: Clinical Measurements: Goal: Ability to maintain clinical measurements within normal limits will improve Outcome: Progressing Goal: Will remain free from infection Outcome: Progressing Goal: Diagnostic test results will improve Outcome: Progressing Goal: Cardiovascular complication will be avoided Outcome: Progressing   Problem: Activity: Goal: Risk for activity intolerance will decrease Outcome: Progressing   Problem: Coping: Goal: Level of anxiety will decrease Outcome: Progressing   Problem: Skin Integrity: Goal: Risk for impaired skin integrity will decrease Outcome: Progressing   Problem: Education: Goal: Ability to describe self-care measures that may prevent or decrease complications (Diabetes Survival Skills Education) will improve Outcome: Progressing Goal: Individualized Educational Video(s) Outcome: Progressing   Problem: Coping: Goal: Ability to adjust to condition or change in health will improve Outcome: Progressing   Problem: Fluid Volume: Goal: Ability to maintain a balanced intake and output will improve Outcome: Progressing   Problem: Health Behavior/Discharge Planning: Goal: Ability to identify and utilize available resources and services will improve Outcome: Progressing Goal: Ability to manage health-related needs will improve Outcome: Progressing   Problem: Metabolic: Goal: Ability to maintain appropriate glucose levels will improve Outcome: Progressing   Problem: Skin Integrity: Goal: Risk for impaired skin integrity will decrease Outcome: Progressing   Problem: Tissue Perfusion: Goal: Adequacy of tissue perfusion will improve Outcome: Progressing   Problem: Education: Goal: Knowledge of disease and its progression will improve Outcome: Progressing Goal: Individualized Educational Video(s) Outcome:  Progressing   Problem: Fluid Volume: Goal: Compliance with measures to maintain balanced fluid volume will improve Outcome: Progressing   Problem: Health Behavior/Discharge Planning: Goal: Ability to manage health-related needs will improve Outcome: Progressing   Problem: Nutritional: Goal: Ability to make healthy dietary choices will improve Outcome: Progressing   Problem: Clinical Measurements: Goal: Complications related to the disease process, condition or treatment will be avoided or minimized Outcome: Progressing

## 2023-11-05 NOTE — Progress Notes (Signed)
 Progress Note   Patient: [name] FMW:[id number] DOB: March 28, 1981 DOA: 09/09/2023     57 DOS: the patient was seen and examined on 11/05/2023   Brief hospital course: [name] is a 43 y.o. male with medical history significant of ESRD on hemodialysis (not fully compliant), insulin -dependent type 1 diabetes, gastroparesis, Left AKA, admitted to [facility] on 07/09/23 for DKA, Sepsis and hospital course significant for MV AND TV  MRSA endocarditis, followed by acute MCA stroke , left occipital and bilateral cerebellar strokes consistent with septic emboli,  Brain abscess from MRSA, left IJ DVT was transferred to [facility] for MV repair on 11/29.  While at [facility] he underwent cardiac cath showing non obstructive disease, craniotomy with abscess drainage on 09/01/23,. He had subarachnoid hemorrhage while on IV heparin , right third toe distal necrosis,was seen by podiatry, but family wanted conservative management. Since his tertiary care needs have been completed, he was transferred back to [facility] for further management.  PTOT  recommending SNF and awaiting placement.  Assessment and Plan:  MRSA TV/MV endocarditis -Positive MRSA blood cultures. Diagnosed on Transesophageal Echocardiogram. Patient managed on Vancomycin . At outside hospital, patient underwent a left heart catheterization which was significant for nonobstructive disease. Infectious disease recommendations to continue Vancomycin  IV and patient has completed his 8 week treatment course.   Prostate fluid collection/abscess -Urology consulted at outside hospital and per their assessment, fluid collection was secondary to dilated seminal vesicle with no surgical intervention recommended. Infectious disease with continued concern for possible abscess. Patient covered with vancomycin  with recommendation for outpatient urology follow-up.   CNS emboli with abscess Cerebritis -Secondary to MRSA endocarditis. Patient evaluated by neurosurgery. Concern  for abscess ruled out by neurosurgery initially. No surgical intervention per neurosurgery. However, at outside hospital, patient was evaluated by neurosurgery who performed a craniotomy for abscess drainage.   ESRD on HD -Nephrology consulted and managing hemodialysis (TueThSat).   Hyperkalemia -Improved with HD.   Anemia of chronic kidney disease -Hemoglobin stable.   CKD mineral bone disease -Continue calcitriol    Acute CVA -MRI confirms large right MCA branch infarct. MRA head (11/25) significant for large necrotic appearing lesion within the right frontal lobe within area of the prior right MCA territory infarct with vasogenic edema and 8 mm leftward midline shift. LDL of 34. Hemoglobin A1C of 8.9%. Transthoracic Echocardiogram (10/13) significant for an LVEF of 25-30% with concern for AV/MV abnormality with Transesophageal Echocardiogram (10/16 & 10/22) significant for MV/TV vegetations. Neurology recommendations for Eliquis . PT/OT recommendations for SNF.   Primary hypertension -Patient is on metoprolol  as an outpatient. Metoprolol  held.   Acute left upper extremity DVT -Continue Eliquis    Right third toe necrosis -Noted. Per documentation, patient/family opting for conservative management and decline surgical management.   Diabetes mellitus type 1 -Poorly controlled with hyperglycemia and hypoglycemia. Hemoglobin A1C of 8.9%. Seems mostly due to continued poor dietary choices. Partly iatrogenic secondary to attempting to adjust regimen for dietary indiscretions. -Continue Semglee  4 units daily and continue SSI   Gastroparesis -Noted. Patient is not on medication management apart from insulin  for blood sugar control. Patient without symptoms and is tolerating his diet.   History of left BKA -Noted. Patient seen by PT/OT with recommendation for SNF on discharge.   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 Patient started on Seroquel  for behavioral change  management.   Overweight -Estimated body mass index is 33.98 kg/m as calculated from the following:   Height as of this encounter: 5' 7 (1.702 m).  Weight as of this encounter: 98.4 kg.   Pressure injury -Left buttock. Not present on admission.  Continue offloading, dressing change.      Subjective: Evaluated this morning, in good spirits.  Feels markedly improved from yesterday.  Tolerated dialysis well, removed 4 L.  States he feels much better now that the fluid is gone, but is in better spirits, less pain.  Otherwise denies fever, chills, chest pain, nausea, vomiting, abdominal pain.    Physical Exam: Vitals:   11/04/23 1825 11/04/23 2038 11/05/23 0500 11/05/23 0748  BP: (!) 149/93 (!) 139/104 (!) 131/94 (!) 144/104  Pulse: (!) 117 (!) 116 (!) 117 (!) 116  Resp: 18 18 18 18   Temp: 98.2 F (36.8 C) 98.9 F (37.2 C) 98.2 F (36.8 C) (!) 97.3 F (36.3 C)  TempSrc: Oral  Oral Oral  SpO2: 100% 94% 97% 97%  Weight:      Height:       GENERAL:  Alert, pleasant, no acute distress, disheveled HEENT:  EOMI CARDIOVASCULAR:  RRR, no murmurs appreciated RESPIRATORY:  Clear to auscultation, no wheezing, rales, or rhonchi GASTROINTESTINAL:  Soft, nontender, nondistended EXTREMITIES: Left AKA NEURO:  No new focal deficits appreciated SKIN: Scattered excoriations in various stages of healing  PSYCH:  Appropriate mood and affect   Data Reviewed:  There are no new results to review at this time.  Family Communication: No family at bedside  Disposition: Status is: Inpatient Remains inpatient appropriate because: Continued hemodialysis  Planned Discharge Destination: Skilled nursing facility    Time spent: 38 minutes  Author: Carliss LELON Canales, DO 11/05/2023 1:46 PM  For on call review [email or web address].

## 2023-11-05 NOTE — Inpatient Diabetes Management (Signed)
 Inpatient Diabetes Program Recommendations  AACE/ADA: New Consensus Statement on Inpatient Glycemic Control   Target Ranges:  Prepandial:   less than 140 mg/dL      Peak postprandial:   less than 180 mg/dL (1-2 hours)      Critically ill patients:  140 - 180 mg/dL    Latest Reference Range & Units 11/04/23 07:24 11/04/23 11:21 11/04/23 18:23 11/04/23 20:31 11/05/23 07:54  Glucose-Capillary 70 - 99 mg/dL 862 (H) 848 (H) 863 (H) 304 (H) 392 (H)   Review of Glycemic Control  Diabetes history: DM1 Outpatient Diabetes medications: Lantus  4 units QAM, Humalog 0-4 units TID with meals Current orders for Inpatient glycemic control: Semglee  4 units daily, Novolog  0-6 units TID with meals  Inpatient Diabetes Program Recommendations:    Insulin : Bedtime CBG 304 mg/dl on 2/6 and CBG 607 mg/dl at 2:45 am today. Please consider changing frequency of Novolog  0-6 units to AC&HS.  Thanks, Earnie Gainer, RN, MSN, CDCES Diabetes Coordinator Inpatient Diabetes Program [phone] (Team Pager from 8am to 5pm)

## 2023-11-05 NOTE — Progress Notes (Signed)
 [facility] Progress Note   Subjective:    Seen and examined patient at bedside. Tolerated HD with net UF 4L. Denies any acute complaints. He is requesting to speak with the SW. Awaiting SNF placement.  Objective Vitals:   11/04/23 1825 11/04/23 2038 11/05/23 0500 11/05/23 0748  BP: (!) 149/93 (!) 139/104 (!) 131/94 (!) 144/104  Pulse: (!) 117 (!) 116 (!) 117 (!) 116  Resp: 18 18 18 18   Temp: 98.2 F (36.8 C) 98.9 F (37.2 C) 98.2 F (36.8 C) (!) 97.3 F (36.3 C)  TempSrc: Oral  Oral Oral  SpO2: 100% 94% 97% 97%  Weight:      Height:       Physical Exam General: Chronically ill appearing male in NAD Heart: S1,S2 RRR No M/R/G Lungs: Clear anteriorly and laterally. Breathing unlabored Abdomen:NABS, NT Extremities: L AKA no stump edema, no RLE edema Dialysis Access: L femoral TDC Drsg intact  Filed Weights   11/02/23 1749 11/04/23 1317 11/04/23 1725  Weight: 98.4 kg 99 kg 95 kg    Intake/Output Summary (Last 24 hours) at 11/05/2023 0833 Last data filed at 11/04/2023 1725 Gross per 24 hour  Intake --  Output 4 ml  Net -4 ml    Additional Objective Labs: Basic Metabolic Panel: Recent Labs  Lab 11/01/23 2057 11/02/23 1511 11/04/23 1336  NA 137 137 140  K 5.5* 4.0 4.5  CL 96* 96* 94*  CO2 24 28 27   GLUCOSE 325* 190* 146*  BUN 46* 31* 41*  CREATININE 6.30* 4.62* 6.21*  CALCIUM  9.0 9.0 9.7  PHOS 7.9* 5.5* 7.2*   Liver Function Tests: Recent Labs  Lab 11/01/23 2057 11/02/23 1511 11/04/23 1336  ALBUMIN  3.5 3.5 3.2*   No results for input(s): LIPASE, AMYLASE in the last 168 hours. CBC: Recent Labs  Lab 10/30/23 0927 11/01/23 2057 11/02/23 1029 11/04/23 1336  WBC 4.4 5.8 4.7 4.0  NEUTROABS  --   --   --  2.6  HGB 8.4* 9.5* 8.8* 8.3*  HCT 28.6* 33.0* 30.6* 29.4*  MCV 94.4 95.7 94.7 94.8  PLT 155 141* 143* 126*   Blood Culture    Component Value Date/Time   SDES BLOOD SITE NOT SPECIFIED 08/25/2023 1706   SPECREQUEST  08/25/2023  1706    BOTTLES DRAWN AEROBIC ONLY Blood Culture results may not be optimal due to an inadequate volume of blood received in culture bottles   CULT  08/25/2023 1706    NO GROWTH 5 DAYS Performed at [facility], [address] [place], [name] [address]    REPTSTATUS 08/30/2023 FINAL 08/25/2023 1706    Cardiac Enzymes: No results for input(s): CKTOTAL, CKMB, CKMBINDEX, TROPONINI in the last 168 hours. CBG: Recent Labs  Lab 11/04/23 0724 11/04/23 1121 11/04/23 1823 11/04/23 2031 11/05/23 0754  GLUCAP 137* 151* 136* 304* 392*   Iron  Studies:  Recent Labs    11/04/23 1336  IRON  31*  TIBC 214*  FERRITIN 140   Lab Results  Component Value Date   INR 1.2 07/22/2020   INR 1.0 05/30/2020   INR 1.2 04/24/2019   Studies/Results: No results found.  Medications:  [START ON 11/06/2023] iron  sucrose      (feeding supplement) PROSource Plus  30 mL Oral BID BM   apixaban   5 mg Oral BID   bacitracin    Topical Daily   Chlorhexidine  Gluconate Cloth  6 each Topical Q0600   [START ON 11/07/2023] darbepoetin (ARANESP ) injection - DIALYSIS  150 mcg  Subcutaneous Q Sun-1800   gabapentin   100 mg Oral TID   Gerhardt's butt cream   Topical BID   insulin  aspart  0-6 Units Subcutaneous TID WC   insulin  glargine-yfgn  4 Units Subcutaneous Daily   loratadine   10 mg Oral Daily   midodrine   10 mg Oral Q T,Th,Sa-HD   pantoprazole   40 mg Oral Daily   povidone-Iodine    Topical Q0600   QUEtiapine   12.5 mg Oral q morning   QUEtiapine   25 mg Oral QHS   senna-docusate  2 tablet Oral BID   sevelamer  carbonate  2,400 mg Oral TID WC   sodium zirconium cyclosilicate   10 g Oral TID    Dialysis Orders: MWF - [facility] -> [facility] be [facility], 400/A1.5, EDW 77kg, L femoral TDC, 2K/3Ca bath, no heparin   Assessment/Plan: 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 Continues on extended Vanc course - will be on Vanc 750mcg IV q HD until 10/27/23. S/p transfer to [facility] and drainage 08/31/24. Repeat brain MRI 10/17/23 showed improvement. Acute B CVA/septic emboli- due to above Acute LUE DVT: On Eliquis  ESRD: Transitioned to TTS. Next HD 2/8. HTN/volume: Didn't reach max UF 2/4 2nd low Bps from pain medication and signing off early. CT 10/09/23 with diffuse anasarca. Significantly above EDW but using bed weights. Max UF with HD. Will set UFG 4-4.5L for tomorrow. On Midodrine  with HD and okay to use IV Albumin  and extra Midodrine  dose PRN. Enforcing fluid restrictions/compliance with full HD time. Anemia of ESRD: Hgbs 8-9s. Raised Aranesp  to . Iron  studies from today include: Iron  31, Tsat 15%, and Ferritin 140. On Fe load X 10 doses with HD-1st dose 2/8. Secondary HPTH: Continue to hold calcitriol  for now. Sevelamer  increased to 3/meals.  Continues to have outside food and refuses any diet except regular (chronic problem with him despite counseling). Checking labs in AM. Hyperkalemia: K+ now stable. Continue Lokelma  10g TID for now, using low bath intermittently with HD.  As noted above diet indiscretion despite counseling Nutrition: Alb 3.5  continue supplements.  T1DM: per primary Dispo: SNF being arranged - not finalized yet  Charmaine Piety, NP [facility] 11/05/2023,8:33 AM  LOS: 57 days

## 2023-11-06 LAB — RENAL FUNCTION PANEL
Albumin: 3.4 g/dL — ABNORMAL LOW (ref 3.5–5.0)
Anion gap: 20 — ABNORMAL HIGH (ref 5–15)
BUN: 31 mg/dL — ABNORMAL HIGH (ref 6–20)
CO2: 26 mmol/L (ref 22–32)
Calcium: 9.9 mg/dL (ref 8.9–10.3)
Chloride: 93 mmol/L — ABNORMAL LOW (ref 98–111)
Creatinine, Ser: 5.59 mg/dL — ABNORMAL HIGH (ref 0.61–1.24)
GFR, Estimated: 12 mL/min — ABNORMAL LOW (ref >60)
Glucose, Bld: 120 mg/dL — ABNORMAL HIGH (ref 70–99)
Phosphorus: 6.3 mg/dL — ABNORMAL HIGH (ref 2.5–4.6)
Potassium: 4.2 mmol/L (ref 3.5–5.1)
Sodium: 139 mmol/L (ref 135–145)

## 2023-11-06 LAB — CBC WITH DIFFERENTIAL/PLATELET
Abs Immature Granulocytes: 0.01 10*3/uL (ref 0.00–0.07)
Basophils Absolute: 0 10*3/uL (ref 0.0–0.1)
Basophils Relative: 1 %
Eosinophils Absolute: 0.4 10*3/uL (ref 0.0–0.5)
Eosinophils Relative: 11 %
HCT: 30.9 % — ABNORMAL LOW (ref 39.0–52.0)
Hemoglobin: 9.1 g/dL — ABNORMAL LOW (ref 13.0–17.0)
Immature Granulocytes: 0 %
Lymphocytes Relative: 23 %
Lymphs Abs: 0.9 10*3/uL (ref 0.7–4.0)
MCH: 27.3 pg (ref 26.0–34.0)
MCHC: 29.4 g/dL — ABNORMAL LOW (ref 30.0–36.0)
MCV: 92.8 fL (ref 80.0–100.0)
Monocytes Absolute: 0.3 10*3/uL (ref 0.1–1.0)
Monocytes Relative: 8 %
Neutro Abs: 2.2 10*3/uL (ref 1.7–7.7)
Neutrophils Relative %: 57 %
Platelets: 141 10*3/uL — ABNORMAL LOW (ref 150–400)
RBC: 3.33 MIL/uL — ABNORMAL LOW (ref 4.22–5.81)
RDW: 15.9 % — ABNORMAL HIGH (ref 11.5–15.5)
WBC: 3.8 10*3/uL — ABNORMAL LOW (ref 4.0–10.5)
nRBC: 0 % (ref 0.0–0.2)

## 2023-11-06 LAB — GLUCOSE, CAPILLARY
Glucose-Capillary: 255 mg/dL — ABNORMAL HIGH (ref 70–99)
Glucose-Capillary: 168 mg/dL — ABNORMAL HIGH (ref 70–99)
Glucose-Capillary: 191 mg/dL — ABNORMAL HIGH (ref 70–99)

## 2023-11-06 MED ORDER — INSULIN ASPART 100 UNIT/ML IJ SOLN
0.0000 [IU] | Freq: Three times a day (TID) | INTRAMUSCULAR | Status: DC
  Administered 2023-11-06: 3 [IU] via SUBCUTANEOUS
  Administered 2023-11-07 (×5): 1 [IU] via SUBCUTANEOUS
  Administered 2023-11-08: 2 [IU] via SUBCUTANEOUS
  Administered 2023-11-08: 3 [IU] via SUBCUTANEOUS
  Administered 2023-11-08: 1 [IU] via SUBCUTANEOUS
  Administered 2023-11-08: 3 [IU] via SUBCUTANEOUS
  Administered 2023-11-09: 1 [IU] via SUBCUTANEOUS
  Administered 2023-11-09 – 2023-11-10 (×2): 2 [IU] via SUBCUTANEOUS
  Administered 2023-11-10: 3 [IU] via SUBCUTANEOUS
  Administered 2023-11-11: 2 [IU] via SUBCUTANEOUS
  Administered 2023-11-11: 3 [IU] via SUBCUTANEOUS
  Administered 2023-11-11: 2 [IU] via SUBCUTANEOUS
  Administered 2023-11-12: 5 [IU] via SUBCUTANEOUS
  Administered 2023-11-12 (×2): 3 [IU] via SUBCUTANEOUS

## 2023-11-06 MED ORDER — HEPARIN SODIUM (PORCINE) 1000 UNIT/ML IJ SOLN
INTRAMUSCULAR | Status: AC
  Filled 2023-11-06: qty 4

## 2023-11-06 NOTE — Progress Notes (Signed)
 Progress Note   Patient: [name] FMW:[id number] DOB: 1980/11/02 DOA: 09/09/2023     58 DOS: the patient was seen and examined on 11/06/2023   Brief [name] course: [name] is a 43 y.o. male with medical history significant of ESRD on hemodialysis (not fully compliant), insulin -dependent type 1 diabetes, gastroparesis, Left AKA, admitted to [facility] on 07/09/23 for DKA, Sepsis and [name] course significant for MV AND TV  MRSA endocarditis, followed by acute MCA stroke , left occipital and bilateral cerebellar strokes consistent with septic emboli,  Brain abscess from MRSA, left IJ DVT was transferred to [name] for MV repair on 11/29.  While at [facility] he underwent cardiac cath showing non obstructive disease, craniotomy with abscess drainage on 09/01/23,. He had subarachnoid hemorrhage while on IV heparin , right third toe distal necrosis,was seen by podiatry, but family wanted conservative management. Since his tertiary care needs have been completed, he was transferred back to [name] for further management.  PTOT  recommending SNF and awaiting placement.  Assessment and Plan:  MRSA TV/MV endocarditis -Positive MRSA blood cultures. Diagnosed on Transesophageal Echocardiogram. Patient managed on Vancomycin . At outside [name], patient underwent a left heart catheterization which was significant for nonobstructive disease. Infectious disease recommendations to continue Vancomycin  IV and patient has completed his 8 week treatment course.   Prostate fluid collection/abscess -Urology consulted at outside [name] and per their assessment, fluid collection was secondary to dilated seminal vesicle with no surgical intervention recommended. Infectious disease with continued concern for possible abscess. Patient covered with vancomycin  with recommendation for outpatient urology follow-up.   CNS emboli with abscess Cerebritis -Secondary to MRSA endocarditis. Patient evaluated by neurosurgery. Concern  for abscess ruled out by neurosurgery initially. No surgical intervention per neurosurgery. However, at outside [name], patient was evaluated by neurosurgery who performed a craniotomy for abscess drainage.   ESRD on HD -Nephrology consulted and managing hemodialysis (TueThSat).   Hyperkalemia -Improved with HD.   Anemia of chronic kidney disease -Hemoglobin stable.   CKD mineral bone disease -Continue calcitriol    Acute CVA -MRI confirms large right MCA branch infarct. MRA head (11/25) significant for large necrotic appearing lesion within the right frontal lobe within area of the prior right MCA territory infarct with vasogenic edema and 8 mm leftward midline shift. LDL of 34. Hemoglobin A1C of 8.9%. Transthoracic Echocardiogram (10/13) significant for an LVEF of 25-30% with concern for AV/MV abnormality with Transesophageal Echocardiogram (10/16 & 10/22) significant for MV/TV vegetations. Neurology recommendations for Eliquis . PT/OT recommendations for SNF.   Primary hypertension -Patient is on metoprolol  as an outpatient. Metoprolol  held.   Acute left upper extremity DVT -Continue Eliquis    Right third toe necrosis -Noted. Per documentation, patient/family opting for conservative management and decline surgical management.   Diabetes mellitus type 1 -Poorly controlled with hyperglycemia and hypoglycemia. Hemoglobin A1C of 8.9%. Seems mostly due to continued poor dietary choices. Partly iatrogenic secondary to attempting to adjust regimen for dietary indiscretions. -Continue Semglee  4 units daily and continue SSI, changed from [name] to ACHS.   Gastroparesis -Noted. Patient is not on medication management apart from insulin  for blood sugar control. Patient without symptoms and is tolerating his diet.   History of left BKA -Noted. Patient seen by PT/OT with recommendation for SNF on discharge.   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 Patient started on Seroquel  for  behavioral change management.   Overweight -Estimated body mass index is 33.98 kg/m as calculated from the following:   Height as of this encounter:  5' 7 (1.702 m).   Weight as of this encounter: 98.4 kg.   Pressure injury -Left buttock. Not present on admission.  Continue offloading, dressing change.  Goals of care - Can closely with TOC on disposition planning.  Patient in need of short-term rehab given his left arm paresis and left BKA.  Also needs dialysis set up for the outpatient setting once transitioned.  Case management not having much luck finding acceptance, still working on placement.  Meantime, will continue hemodialysis per nephrology.  Had a long conversation with the patient about this process and there is no easy means of discharge at this moment.  Will attempt to keep him apprised of updates.      Subjective: Evaluated this morning, alert and cooperative.  States he feels frustrated with his lack of discharge planning.  Had a long conversation with him about the process involved, will need to be set (dialysis, rehab, etc.) and that everything must be in line prior to discharge.  She also complaining of intermittent throbbing pain in his left hand.  Otherwise denies fever, chills, chest pain, nausea, vomiting, abdominal pain.  Cajoled for dialysis later today.  Physical Exam: Vitals:   11/05/23 1716 11/05/23 2033 11/06/23 0354 11/06/23 0900  BP: 138/86 112/81 (!) 120/93 (!) 141/83  Pulse: (!) 108 (!) 109 (!) 110 (!) 116  Resp: 18 18 18 20   Temp: 98.1 F (36.7 C) 98.5 F (36.9 C) 98.1 F (36.7 C) 98.4 F (36.9 C)  TempSrc: Oral Oral Oral Axillary  SpO2: 99% 100% 96% 95%  Weight:      Height:       GENERAL:  Alert, pleasant, no acute distress, disheveled HEENT:  EOMI CARDIOVASCULAR:  RRR, no murmurs appreciated RESPIRATORY:  Clear to auscultation, no wheezing, rales, or rhonchi GASTROINTESTINAL:  Soft, nontender, mildly distended EXTREMITIES: Left AKA,  NEURO:  LUE flaccid, no new focal deficits appreciated SKIN: Right third toe dry gangrene, tender, scattered excoriations in various stages of healing  PSYCH:  Appropriate mood and affect   Data Reviewed:  There are no new results to review at this time.  Family Communication: No family at bedside  Disposition: Status is: Inpatient Remains inpatient appropriate because: Continued hemodialysis  Planned Discharge Destination: Skilled nursing facility    Time spent: 38 minutes  Author: Carliss LELON Canales, DO 11/06/2023 12:39 PM  For on call review [email or web address].

## 2023-11-06 NOTE — Plan of Care (Signed)
  Problem: Health Behavior/Discharge Planning: Goal: Ability to manage health-related needs will improve Outcome: Progressing   Problem: Clinical Measurements: Goal: Will remain free from infection Outcome: Progressing   Problem: Clinical Measurements: Goal: Diagnostic test results will improve Outcome: Progressing   Problem: Clinical Measurements: Goal: Cardiovascular complication will be avoided Outcome: Progressing   Problem: Activity: Goal: Risk for activity intolerance will decrease Outcome: Progressing   Problem: Skin Integrity: Goal: Risk for impaired skin integrity will decrease Outcome: Progressing   Problem: Coping: Goal: Level of anxiety will decrease Outcome: Progressing

## 2023-11-06 NOTE — Progress Notes (Signed)
 Pt cut off 30 mins d/t tired. AMA form signed. Charge nurse Clem notified. Midodrine  10 mg po given pre tx.  11/06/23 1730  Vitals  Temp 98.2 F (36.8 C)  Temp Source Oral  BP 92/60  BP Location Right Arm  BP Method Automatic  Patient Position (if appropriate) Lying  Oxygen  Therapy  O2 Device Nasal Cannula  O2 Flow Rate (L/min) 2 L/min  During Treatment Monitoring  Intra-Hemodialysis Comments See progress note  Post Treatment  Dialyzer Clearance Lightly streaked  Hemodialysis Intake (mL) 0 mL  Liters Processed 72  Fluid Removed (mL) 3700 mL  Tolerated HD Treatment Yes  Post-Hemodialysis Comments Pt goal not met d/t cut off 30 mins.  Hemodialysis Catheter Left Femoral vein Double lumen Permanent (Tunneled)  Placement Date/Time: 07/16/23 1318   Serial / Lot #: [id number]  Expiration Date: 05/24/26  Time Out: Correct patient;Correct site;Correct procedure  Maximum sterile barrier precautions: Hand hygiene;Cap;Mask;Sterile gown;Sterile gloves;Large sterile ...  Site Condition No complications  Blue Lumen Status Heparin  locked  Red Lumen Status Heparin  locked  Catheter fill solution Heparin  1000 units/ml  Catheter fill volume (Arterial) 2.3 cc  Catheter fill volume (Venous) 2.3  Dressing Type Transparent  Dressing Status Antimicrobial disc/dressing in place;Clean, Dry, Intact  Interventions New dressing  Drainage Description None  Dressing Change Due 11/09/23  Post treatment catheter status Capped and Clamped

## 2023-11-06 NOTE — Progress Notes (Signed)
 Subjective: For HD today, no acute complaints/  Objective Vital signs in last 24 hours: Vitals:   11/05/23 1716 11/05/23 2033 11/06/23 0354 11/06/23 0900  BP: 138/86 112/81 (!) 120/93 (!) 141/83  Pulse: (!) 108 (!) 109 (!) 110 (!) 116  Resp: 18 18 18 20   Temp: 98.1 F (36.7 C) 98.5 F (36.9 C) 98.1 F (36.7 C) 98.4 F (36.9 C)  TempSrc: Oral Oral Oral Axillary  SpO2: 99% 100% 96% 95%  Weight:      Height:       Weight change:   Physical Exam: General: Alert, chronically ill male NAD Heart: RRR no MRG Lungs: CTA bilaterally RA, unlabored Abdomen: NABS, soft NT ND, trace abdominal edema Extremities: Left AKA trace stump edema, RLE trace edema Dialysis Access: L FEM TDC dressing dry clear  Dialysis Orders: MWF - [facility] -> may be will be [facility], 400/A1.5, EDW 77kg, L femoral TDC, 2K/3Ca bath, no heparin    Problem/Plan: 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 Continues on extended Vanc course - will be on Vanc 750mcg IV q HD until 10/27/23. S/p transfer to [facility] and drainage 08/31/24. Repeat brain MRI 10/17/23 showed improvement. Acute B CVA/septic emboli- due to above Acute LUE DVT: On Eliquis  ESRD: Transitioned to TTS. Next HD 2/8. HTN/volume: Didn't reach max UF 2/4 2nd low Bps from pain medication and signing off early. CT 10/09/23 with diffuse anasarca. Significantly above EDW but using bed weights. Max UF with HD. Attempt  UFG 4-4.5L for today On Midodrine  with HD and okay to use IV Albumin  and extra Midodrine  dose PRN. Enforcing fluid restrictions/compliance with full HD time. Anemia of ESRD: Hgbs 8-9s. Raised Aranesp  to . Iron  studies from today include: Iron  31, Tsat 15%, and Ferritin 140. On Fe load X 10 doses with HD-1st dose 2/8. Secondary HPTH:  CCA ^, Continue to hold calcitriol  for now. Sevelamer   ha been ^  to 3/meals.  Continues to have outside food and refuses any diet except regular (chronic problem with him despite counseling).phos 6.3 . Hyperkalemia: K+ now stable. Continue Lokelma  10g TID for now, using low bath intermittently with HD.  As noted above diet indiscretion despite counseling Prostate fluid collection/abscess = noted per admit team urology consulted at outside hospital per their assessment fluid collection was 2/2 dilated seminal vesicle with no surg.  Intervention recommended/ID concern for possible abscess patient covered with Vanc. and recommendation for outpatient urology follow-up Nutrition: Alb 3.5  continue supplements.  T1DM: per primary Dispo: SNF being arranged - not finalized yet   Alm Shown, PA-C [facility] [phone] 11/06/2023,11:12 AM  LOS: 58 days   Labs: Basic Metabolic Panel: Recent Labs  Lab 11/02/23 1511 11/04/23 1336 11/06/23 0509  NA 137 140 139  K 4.0 4.5 4.2  CL 96* 94* 93*  CO2 28 27 26   GLUCOSE 190* 146* 120*  BUN 31* 41* 31*  CREATININE 4.62* 6.21* 5.59*  CALCIUM  9.0 9.7 9.9  PHOS 5.5* 7.2* 6.3*   Liver Function Tests: Recent Labs  Lab 11/02/23 1511 11/04/23 1336 11/06/23 0509  ALBUMIN  3.5 3.2* 3.4*   No results for input(s): LIPASE, AMYLASE in the last 168 hours. No results for input(s): AMMONIA in the last 168 hours. CBC: Recent Labs  Lab 11/01/23 2057 11/02/23 1029 11/04/23 1336 11/06/23 0509  WBC 5.8 4.7 4.0  3.8*  NEUTROABS  --   --  2.6 2.2  HGB 9.5* 8.8* 8.3* 9.1*  HCT 33.0* 30.6* 29.4* 30.9*  MCV 95.7 94.7 94.8 92.8  PLT 141* 143* 126* 141*   Cardiac Enzymes: No results for input(s): CKTOTAL, CKMB, CKMBINDEX, TROPONINI in the last 168 hours. CBG: Recent Labs  Lab 11/04/23 1823 11/04/23 2031 11/05/23 0754 11/05/23 1200 11/05/23 1714  GLUCAP 136* 304* 392* 297* 147*    Studies/Results: No results found. Medications:  iron  sucrose      (feeding supplement)  PROSource Plus  30 mL Oral BID BM   apixaban   5 mg Oral BID   bacitracin    Topical Daily   Chlorhexidine  Gluconate Cloth  6 each Topical Q0600   [START ON 11/07/2023] darbepoetin (ARANESP ) injection - DIALYSIS  150 mcg Subcutaneous Q Sun-1800   gabapentin   100 mg Oral TID   Gerhardt's butt cream   Topical BID   insulin  aspart  0-6 Units Subcutaneous TID AC & HS   insulin  glargine-yfgn  4 Units Subcutaneous Daily   loratadine   10 mg Oral Daily   midodrine   10 mg Oral Q T,Th,Sa-HD   pantoprazole   40 mg Oral Daily   povidone-Iodine    Topical Q0600   QUEtiapine   12.5 mg Oral q morning   QUEtiapine   25 mg Oral QHS   senna-docusate  2 tablet Oral BID   sevelamer  carbonate  2,400 mg Oral TID WC   sodium zirconium cyclosilicate   10 g Oral TID

## 2023-11-07 LAB — GLUCOSE, CAPILLARY
Glucose-Capillary: 178 mg/dL — ABNORMAL HIGH (ref 70–99)
Glucose-Capillary: 165 mg/dL — ABNORMAL HIGH (ref 70–99)
Glucose-Capillary: 198 mg/dL — ABNORMAL HIGH (ref 70–99)
Glucose-Capillary: 179 mg/dL — ABNORMAL HIGH (ref 70–99)

## 2023-11-07 NOTE — Plan of Care (Signed)
   Problem: Health Behavior/Discharge Planning: Goal: Ability to manage health-related needs will improve Outcome: Completed/Met

## 2023-11-07 NOTE — Progress Notes (Addendum)
 Subjective: Seen in room HD yesterday signed off 30 minutes early, did tolerate 3.7 L UF/no current complaints  Objective Vital signs in last 24 hours: Vitals:   11/06/23 1844 11/06/23 2015 11/07/23 0443 11/07/23 0921  BP: (!) 131/90 (!) 138/97 121/85 (!) 144/89  Pulse:  (!) 110 (!) 110 (!) 121  Resp:  19 18 18   Temp:  98.2 F (36.8 C) 98.1 F (36.7 C) 97.9 F (36.6 C)  TempSrc:   Oral Oral  SpO2:  100% 95% 97%  Weight:      Height:       Weight change:   Physical Exam: General: Alert, chronically ill male NAD Heart: RRR no MRG Lungs: CTA bilaterally RA, unlabored Abdomen: NABS, soft NT ND, trace abdominal edema Extremities: Left AKA trace stump edema, RLE trace edema Dialysis Access: L FEM TDC dressing dry clear   Dialysis Orders: MWF - [facility] -> may be will be [facility], 400/A1.5, EDW 77kg, L femoral TDC, 2K/3Ca bath, no heparin    Problem/Plan: 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 Completed on extensive IV Vanc course on 1/29. S/p transfer to [facility] and drainage 08/31/24. Repeat brain MRI 10/17/23 showed improvement. Acute B CVA/septic emboli- due to above Acute LUE DVT: On Eliquis  ESRD: Transitioned to TTS. Next HD 2/11 HTN/volume: Didn't reach max UF 2/4 2nd low Bps from pain medication and signing off early. CT 10/09/23 with diffuse anasarca. Significantly above EDW but using bed weights. Max UF with HD. Attempt  UFG 4-4.5L for today On Midodrine  with HD and okay to use IV Albumin  and extra Midodrine  dose PRN. Enforcing fluid restrictions/compliance with full HD time. Anemia of ESRD: Hgbs 8-9s. Raised Aranesp  to . Iron  studies from today include: Iron  31, Tsat 15%, and Ferritin 140. On Fe load X 10 doses with HD-1st dose 2/8. Secondary HPTH:  CCA ^, Continue to hold calcitriol  for now. Sevelamer   ha been ^ to  3/meals.  Continues to have outside food and refuses any diet except regular (chronic problem with him despite counseling).phos 6.3 . Hyperkalemia: K+ 4.2, now stable. Continue Lokelma  10g TID for now, using low bath intermittently with HD.  As noted above diet indiscretion despite counseling Prostate fluid collection/abscess = noted per admit team urology consulted at outside hospital per their assessment fluid collection was 2/2 dilated seminal vesicle with no surg.  Intervention recommended/ID concern for possible abscess patient covered with Vanc. and recommendation for outpatient urology follow-up Nutrition: Alb 3.4,  continue supplements.  T1DM: per primary Dispo: SNF being arranged - not finalized yet  Alm Shown, PA-C [facility] [phone] 11/07/2023,10:28 AM  LOS: 59 days   Pt seen, examined and agree w A/P as above.  [name] MD  [facility] 11/07/2023, 2:44 PM   Labs: Basic Metabolic Panel: Recent Labs  Lab 11/02/23 1511 11/04/23 1336 11/06/23 0509  NA 137 140 139  K 4.0 4.5 4.2  CL 96* 94* 93*  CO2 28 27 26   GLUCOSE 190* 146* 120*  BUN 31* 41* 31*  CREATININE 4.62* 6.21* 5.59*  CALCIUM  9.0 9.7 9.9  PHOS 5.5* 7.2* 6.3*   Liver Function Tests: Recent Labs  Lab 11/02/23 1511 11/04/23 1336 11/06/23 0509  ALBUMIN  3.5 3.2* 3.4*   No results for input(s): LIPASE, AMYLASE in the last 168 hours. No results for input(s): AMMONIA in the  last 168 hours. CBC: Recent Labs  Lab 11/01/23 2057 11/02/23 1029 11/04/23 1336 11/06/23 0509  WBC 5.8 4.7 4.0 3.8*  NEUTROABS  --   --  2.6 2.2  HGB 9.5* 8.8* 8.3* 9.1*  HCT 33.0* 30.6* 29.4* 30.9*  MCV 95.7 94.7 94.8 92.8  PLT 141* 143* 126* 141*   Cardiac Enzymes: No results for input(s): CKTOTAL, CKMB, CKMBINDEX, TROPONINI in the last 168 hours. CBG: Recent Labs  Lab 11/05/23 1714 11/06/23 1136 11/06/23 1828 11/06/23 2139 11/07/23 0751  GLUCAP 147* 255* 168* 191* 178*     Studies/Results: No results found. Medications:  iron  sucrose 100 mg (11/06/23 1149)    (feeding supplement) PROSource Plus  30 mL Oral BID BM   apixaban   5 mg Oral BID   bacitracin    Topical Daily   Chlorhexidine  Gluconate Cloth  6 each Topical Q0600   darbepoetin (ARANESP ) injection - DIALYSIS  150 mcg Subcutaneous Q Sun-1800   gabapentin   100 mg Oral TID   Gerhardt's butt cream   Topical BID   insulin  aspart  0-6 Units Subcutaneous TID AC & HS   insulin  glargine-yfgn  4 Units Subcutaneous Daily   loratadine   10 mg Oral Daily   midodrine   10 mg Oral Q T,Th,Sa-HD   pantoprazole   40 mg Oral Daily   povidone-Iodine    Topical Q0600   QUEtiapine   12.5 mg Oral q morning   QUEtiapine   25 mg Oral QHS   senna-docusate  2 tablet Oral BID   sevelamer  carbonate  2,400 mg Oral TID WC   sodium zirconium cyclosilicate   10 g Oral TID

## 2023-11-07 NOTE — Plan of Care (Signed)
  Problem: Health Behavior/Discharge Planning: Goal: Ability to manage health-related needs will improve Outcome: Progressing   Problem: Clinical Measurements: Goal: Will remain free from infection Outcome: Progressing   Problem: Clinical Measurements: Goal: Diagnostic test results will improve Outcome: Progressing   Problem: Clinical Measurements: Goal: Cardiovascular complication will be avoided Outcome: Progressing   Problem: Activity: Goal: Risk for activity intolerance will decrease Outcome: Progressing

## 2023-11-07 NOTE — Progress Notes (Signed)
 Progress Note   Patient: [name] FMW:[id number] DOB: [birth year] DOA: 09/09/2023     59 DOS: the patient was seen and examined on 11/07/2023   Brief hospital course: [name] is a 43 y.o. male with medical history significant of ESRD on hemodialysis (not fully compliant), insulin -dependent type 1 diabetes, gastroparesis, Left AKA, admitted to [facility] on 07/09/23 for DKA, Sepsis and hospital course significant for MV AND TV  MRSA endocarditis, followed by acute MCA stroke , left occipital and bilateral cerebellar strokes consistent with septic emboli,  Brain abscess from MRSA, left IJ DVT was transferred to [facility] for MV repair on 11/29.  While at [facility] he underwent cardiac cath showing non obstructive disease, craniotomy with abscess drainage on 09/01/23,. He had subarachnoid hemorrhage while on IV heparin , right third toe distal necrosis,was seen by podiatry, but family wanted conservative management. Since his tertiary care needs have been completed, he was transferred back to [facility] for further management.  PTOT  recommending SNF and awaiting placement.  Assessment and Plan:  MRSA TV/MV endocarditis -Positive MRSA blood cultures. Diagnosed on Transesophageal Echocardiogram. Patient managed on Vancomycin . At outside hospital, patient underwent a left heart catheterization which was significant for nonobstructive disease. Infectious disease recommendations to continue Vancomycin  IV and patient has completed his 8 week treatment course.   Prostate fluid collection/abscess -Urology consulted at outside hospital and per their assessment, fluid collection was secondary to dilated seminal vesicle with no surgical intervention recommended. Infectious disease with continued concern for possible abscess. Patient covered with vancomycin  with recommendation for outpatient urology follow-up.   CNS emboli with abscess Cerebritis -Secondary to MRSA endocarditis. Patient evaluated by neurosurgery. Concern  for abscess ruled out by neurosurgery initially. No surgical intervention per neurosurgery. However, at outside hospital, patient was evaluated by neurosurgery who performed a craniotomy for abscess drainage.   ESRD on HD -Nephrology consulted and managing hemodialysis (TueThSat).   Hyperkalemia -Improved with HD.   Anemia of chronic kidney disease -Hemoglobin stable.   CKD mineral bone disease -Continue calcitriol    Acute CVA -MRI confirms large right MCA branch infarct. MRA head (11/25) significant for large necrotic appearing lesion within the right frontal lobe within area of the prior right MCA territory infarct with vasogenic edema and 8 mm leftward midline shift. LDL of 34. Hemoglobin A1C of 8.9%. Transthoracic Echocardiogram (10/13) significant for an LVEF of 25-30% with concern for AV/MV abnormality with Transesophageal Echocardiogram (10/16 & 10/22) significant for MV/TV vegetations. Neurology recommendations for Eliquis . PT/OT recommendations for SNF.   Primary hypertension -Patient is on metoprolol  as an outpatient. Metoprolol  held.   Acute left upper extremity DVT -Continue Eliquis    Right third toe necrosis -Noted. Per documentation, patient/family opting for conservative management and decline surgical management.   Diabetes mellitus type 1 -Poorly controlled with hyperglycemia and hypoglycemia. Hemoglobin A1C of 8.9%. Seems mostly due to continued poor dietary choices. Partly iatrogenic secondary to attempting to adjust regimen for dietary indiscretions. -Continue Semglee  4 units daily and continue SSI, changed from [facility] to [facility].   Gastroparesis -Noted. Patient is not on medication management apart from insulin  for blood sugar control. Patient without symptoms and is tolerating his diet.   History of left BKA -Noted. Patient seen by PT/OT with recommendation for SNF on discharge.   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 Patient started on Seroquel  for  behavioral change management.   Pressure injury -Left buttock. Not present on admission.  Continue offloading, dressing change.  Goals of care - Can closely  with TOC on disposition planning.  Patient in need of short-term rehab given his left arm paresis and left BKA.  Also needs dialysis set up for the outpatient setting once transitioned.  Case management not having much luck finding acceptance, still working on placement.  Meantime, will continue hemodialysis per nephrology.  Had a long conversation with the patient about this process and there is no easy means of discharge at this moment.  Will attempt to keep him apprised of updates.      Subjective: Evaluated this morning, alert and cooperative.  Oriented dialysis well yesterday, removing 3.7 L.  Also able to update the patient on any plans concerning his discharge/disposition.  Again had a long conversation with him about the process involved, will need to be set (dialysis, rehab, etc.) and that everything must be in line prior to discharge.  Otherwise denies fever, chills, chest pain, nausea, vomiting, abdominal pain.   Physical Exam: Vitals:   11/06/23 1844 11/06/23 2015 11/07/23 0443 11/07/23 0921  BP: (!) 131/90 (!) 138/97 121/85 (!) 144/89  Pulse:  (!) 110 (!) 110 (!) 121  Resp:  19 18 18   Temp:  98.2 F (36.8 C) 98.1 F (36.7 C) 97.9 F (36.6 C)  TempSrc:   Oral Oral  SpO2:  100% 95% 97%  Weight:      Height:       GENERAL:  Alert, pleasant, no acute distress, disheveled HEENT:  EOMI CARDIOVASCULAR:  RRR, no murmurs appreciated RESPIRATORY:  Clear to auscultation, no wheezing, rales, or rhonchi GASTROINTESTINAL:  Soft, nontender, mildly distended EXTREMITIES: Left AKA,  NEURO: LUE flaccid, no new focal deficits appreciated SKIN: Right third toe dry gangrene, tender, scattered excoriations in various stages of healing  PSYCH:  Appropriate mood and affect   Data Reviewed:  There are no new results to review at this  time.  Family Communication: No family at bedside  Disposition: Status is: Inpatient Remains inpatient appropriate because: Continued hemodialysis  Planned Discharge Destination: Skilled nursing facility    Time spent: 37 minutes  Author: Carliss LELON Canales, DO 11/07/2023 11:44 AM  For on call review [email or web address].

## 2023-11-08 LAB — GLUCOSE, CAPILLARY
Glucose-Capillary: 286 mg/dL — ABNORMAL HIGH (ref 70–99)
Glucose-Capillary: 284 mg/dL — ABNORMAL HIGH (ref 70–99)
Glucose-Capillary: 203 mg/dL — ABNORMAL HIGH (ref 70–99)
Glucose-Capillary: 198 mg/dL — ABNORMAL HIGH (ref 70–99)

## 2023-11-08 MED ORDER — CHLORHEXIDINE GLUCONATE CLOTH 2 % EX PADS
6.0000 | MEDICATED_PAD | Freq: Every day | CUTANEOUS | Status: DC
  Administered 2023-11-08 – 2023-11-10 (×3): 6 via TOPICAL

## 2023-11-08 NOTE — TOC Progression Note (Signed)
 Transition of Care [facility]) - Progression Note    Patient Details  Name: [name] MRN: [id number] Date of Birth: 1981-04-08  Transition of Care [facility] CM/SW Contact  Katrinka Parr, [name] Phone Number: 11/08/2023, 10:50 AM  Clinical Narrative:     Pt currently has no SNF bed offers.  CSW expanded SNF bed search to additional facilities outside of [place].   Expected Discharge Plan: Skilled Nursing Facility Barriers to Discharge: Continued Medical Work up  Expected Discharge Plan and Services                                               Social Determinants of Health (SDOH) Interventions SDOH Screenings   Food Insecurity: No Food Insecurity (09/09/2023)  Housing: Unknown (09/09/2023)  Transportation Needs: No Transportation Needs (09/09/2023)  Utilities: Not At Risk (09/09/2023)  Alcohol Screen: Low Risk  (02/15/2023)  Depression (PHQ2-9): Low Risk  (02/15/2023)  Financial Resource Strain: Low Risk  (02/15/2023)  Physical Activity: Inactive (02/15/2023)  Social Connections: Socially Integrated (02/15/2023)  Stress: No Stress Concern Present (02/15/2023)  Tobacco Use: Low Risk  (09/12/2023)  Recent Concern: Tobacco Use - Medium Risk (09/01/2023)   Received from [facility]    Readmission Risk Interventions     No data to display

## 2023-11-08 NOTE — Plan of Care (Signed)
  Problem: Clinical Measurements: Goal: Diagnostic test results will improve Outcome: Completed/Met

## 2023-11-08 NOTE — Plan of Care (Signed)
   Problem: Clinical Measurements: Goal: Ability to maintain clinical measurements within normal limits will improve Outcome: Progressing

## 2023-11-08 NOTE — Progress Notes (Signed)
 Physical Therapy Treatment Patient Details Name: [name] MRN: [id number] DOB: [birth year] Today's Date: 11/08/2023   History of Present Illness 43 y.o. male presents to [name] 09/09/23 as a transfer from [name] for further management of endocarditis. Pt originally was admitted to [name] 07/09/23 for DKA and sepsis after being found on the ground. 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 10/17 pt had a R MCA, L occipital, and B cerebellum CVA w/ septic emboli. ICU admit 10/18 w/ intubation 10/18-10/24. Pt was transferred to [name] for MV repair on 11/29. [facility], pt had cardiac cath w/ non obstructive disease, SAH, a craniotomy w/ abscess drainage on 12/4, and R third toe distal necrosis. PMH: chronic L AKA, MRSA, IDDM, ESRD on HD MWF, HTN, TEE positive for vegetation on multiple valves, cellulitis, medical noncompliance, narcotic dependence, DM with gastroparesis, L internal jugular DVT. (Simultaneous filing. User may not have seen previous data.)    PT Comments  Continuing work on functional mobility and activity tolerance; session focused on the building blocks of functional transfers, specifically on anterior weight shift onto his intact, right lower extremity to begin to [name] hips and trunk for functional scooting, and eventual standing; [name] sat at a bed for at least 10 to 15 minutes, and performed multiple reps of intentional forward leaning, and pushing his foot into the floor; when we begin to add the element of lifting hips off the bed, [name] reported he was fatigued and done with the physical therapy session; will plan to work on the lift and shift of hips next session;  To get to a more functional scoot transfer, [name] needs to move more slowly into the weight shift onto his right foot and hand and slowly into moving his hips; Right now he makes fast, small, inefficient, and energetically taxing hip shifts; we discussed plans for next  session to work on the forward lean slowly, and add the hips lift and shift slowly;  At the end of our session, we discussed the need to do things differently than how we always do them to work towards change and progress; This PT tried to emphasize that we are doing all these things to help him reach his stated goals of getting out of the [name].     If plan is discharge home, recommend the following: Two people to help with walking and/or transfers;Assist for transportation;Supervision due to cognitive status;Help with stairs or ramp for entrance;Two people to help with bathing/dressing/bathroom;Assistance with cooking/housework;Direct supervision/assist for financial management;Direct supervision/assist for medications management   Can travel by private vehicle     No  Equipment Recommendations  Hoyer lift;Wheelchair (measurements PT);Wheelchair cushion (measurements PT);Other (comment)    Recommendations for Other Services       Precautions / Restrictions Precautions Precautions: Fall;Other (comment) (Simultaneous filing. User may not have seen previous data.) Precaution Comments: L AKA, L hemiparesis and edema, L neglect, bowel incontinence. L femoral HD cath (Simultaneous filing. User may not have seen previous data.) Restrictions Weight Bearing Restrictions Per Provider Order: No (Simultaneous filing. User may not have seen previous data.) LLE Weight Bearing Per Provider Order: Non weight bearing Other Position/Activity Restrictions: Pt has sling for LUE when mobilizing OOB; LLE prosthetic not fitting     Mobility  Bed Mobility Overal bed mobility: Needs Assistance Bed Mobility: Rolling, Sidelying to Sit, Sit to Supine Rolling: Mod assist Sidelying to sit: Mod assist, +2 for physical assistance, +2 for safety/equipment   Sit to  supine: Contact guard assist   General bed mobility comments: worked on rolling fully to R sidelying to be able to use RUE to push up; usually pt  reaches out for handheld assist to pull to sit, and it took time for pt to understand that using his R arm to push up will incr his independnece    Transfers Overall transfer level: Needs assistance Equipment used:  (2 person hand-on assist)               General transfer comment: EOB work focused on weight shifting; performed leans forward and onto R Foot, pushing with RUE to unweigh hips for liftoff to stand or for scooting; multiple reps of forward leans; Cues to slowly lean onto foot and slowly push to lift hips -- "first lean, then lift"    Ambulation/Gait                   Stairs             Wheelchair Mobility     Tilt Bed    Modified Rankin (Stroke Patients Only) Modified Rankin (Stroke Patients Only) Pre-Morbid Rankin Score: Moderate disability Modified Rankin: Severe disability     Balance Overall balance assessment: Needs assistance   Sitting balance-[name]: Fair                                      Cognition Arousal: Alert (Simultaneous filing. User may not have seen previous data.) Behavior During Therapy: [facility] for tasks assessed/performed (Frustrated  Simultaneous filing. User may not have seen previous data.) Overall Cognitive Status: Impaired/Different from baseline (No family/caregiver present this day to confirm baseline  Simultaneous filing. User may not have seen previous data.)                       Memory: Decreased short-term memory (Simultaneous filing. User may not have seen previous data.) Following Commands: Follows one step commands with increased time, Follows multi-step commands inconsistently, Follows multi-step commands with increased time (Simultaneous filing. User may not have seen previous data.) Safety/Judgement: Decreased awareness of deficits, Decreased awareness of safety (Simultaneous filing. User may not have seen previous data.) Awareness: Emergent (Simultaneous filing. User may not  have seen previous data.) Problem Solving: Slow processing, Difficulty sequencing, Requires verbal cues (Simultaneous filing. User may not have seen previous data.)          Exercises      General Comments General comments (skin integrity, edema, etc.): Re-visited [name]'s goals; his stated goals are to move his LUE better and to get out of teh [name]      Pertinent Vitals/Pain Pain Assessment Pain Assessment: Faces (Simultaneous filing. User may not have seen previous data.) Faces Pain Scale: No hurt (Simultaneous filing. User may not have seen previous data.) Pain Intervention(s): Monitored during session (Simultaneous filing. User may not have seen previous data.)    Home Living                          Prior Function            PT Goals (current goals can now be found in the care plan section) Acute Rehab PT Goals Patient Stated Goal: rehab at SNF PT Goal Formulation: With patient Time For Goal Achievement: 11/09/23 Potential to Achieve Goals: Fair Progress towards PT goals: Progressing toward  goals    Frequency    Min 1X/week      PT Plan      Co-evaluation              AM-PAC PT "6 Clicks" Mobility   Outcome Measure  Help needed turning from your back to your side while in a flat bed without using bedrails?: A Lot Help needed moving from lying on your back to sitting on the side of a flat bed without using bedrails?: A Lot Help needed moving to and from a bed to a chair (including a wheelchair)?: Total Help needed standing up from a chair using your arms (e.g., wheelchair or bedside chair)?: Total Help needed to walk in [name] room?: Total Help needed climbing 3-5 steps with a railing? : Total 6 Click Score: 8    End of Session Equipment Utilized During Treatment: Other (comment) (bed pad) Activity Tolerance: Patient tolerated treatment well Patient left: in bed;with call bell/phone within reach;with bed alarm set Nurse  Communication: Mobility status;Need for lift equipment (PT is done; can return with pt's meds) PT Visit Diagnosis: Other abnormalities of gait and mobility (R26.89);Other symptoms and signs involving the nervous system (R29.898);Hemiplegia and hemiparesis Hemiplegia - Right/Left: Left Hemiplegia - dominant/non-dominant: Non-dominant Hemiplegia - caused by: Cerebral infarction     Time: 1323-1400 PT Time Calculation (min) (ACUTE ONLY): 37 min  Charges:    $Therapeutic Activity: 23-37 mins PT General Charges $$ ACUTE PT VISIT: 1 Visit                     Darcus Eastern, PT  Acute Rehabilitation Services Office [phone] Secure Chat welcomed    Marcial Setting 11/08/2023, 2:48 PM

## 2023-11-08 NOTE — Progress Notes (Signed)
 Progress Note   Patient: [name] JXB:[id number] DOB: Apr 12, 1981 DOA: 09/09/2023     60 DOS: the patient was seen and examined on 11/08/2023   Brief hospital course: [name] is a 43 y.o. male with medical history significant of ESRD on hemodialysis (not fully compliant), insulin -dependent type 1 diabetes, gastroparesis, Left AKA, admitted to [facility] on 07/09/23 for DKA, Sepsis and hospital course significant for MV AND TV  MRSA endocarditis, followed by acute MCA stroke , left occipital and bilateral cerebellar strokes consistent with septic emboli,  Brain abscess from MRSA, left IJ DVT was transferred to [facility] for MV repair on 11/29.  While at [facility] he underwent cardiac cath showing non obstructive disease, craniotomy with abscess drainage on 09/01/23,. He had subarachnoid hemorrhage while on IV heparin , right third toe distal necrosis,was seen by podiatry, but family wanted conservative management. Since his tertiary care needs have been completed, he was transferred back to [facility] for further management.  PTOT  recommending SNF and awaiting placement.  Assessment and Plan:  MRSA TV/MV endocarditis-resolved -Positive MRSA blood cultures. Diagnosed on Transesophageal Echocardiogram. Patient managed on Vancomycin . At outside hospital, patient underwent a left heart catheterization which was significant for nonobstructive disease. Infectious disease recommendations to continue Vancomycin  IV and patient has completed his 8 week treatment course.   Prostate fluid collection/abscess-resolved -Urology consulted at outside hospital and per their assessment, fluid collection was secondary to dilated seminal vesicle with no surgical intervention recommended. Infectious disease with continued concern for possible abscess. Patient covered with vancomycin  with recommendation for outpatient urology follow-up.   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 Patient evaluated  by neurosurgery. Concern for abscess ruled out by neurosurgery initially. No surgical intervention per neurosurgery. However, at outside hospital, patient was evaluated by neurosurgery who performed a craniotomy for abscess drainage.   ESRD on HD -Nephrology following closely and managing hemodialysis (TueThSat).   Hyperkalemia -Improved with HD.   Anemia of chronic kidney disease -Hemoglobin stable.   CKD mineral bone disease -Continue calcitriol    Acute CVA -MRI confirms large right MCA branch infarct. MRA head (11/25) significant for large necrotic appearing lesion within the right frontal lobe within area of the prior right MCA territory infarct with vasogenic edema and 8 mm leftward midline shift. LDL of 34. Hemoglobin A1C of 8.9%. Transthoracic Echocardiogram (10/13) significant for an LVEF of 25-30% with concern for AV/MV abnormality with Transesophageal Echocardiogram (10/16 & 10/22) significant for MV/TV vegetations. Neurology recommendations for Eliquis . PT/OT recommendations for SNF.   Primary hypertension -Patient is on metoprolol  as an outpatient. Metoprolol  held secondary to hypotension with dialysis.   Acute left upper extremity DVT -Continue Eliquis    Right third toe necrosis -Noted. Per documentation, patient/family opting for conservative management and decline surgical management.   Diabetes mellitus type 1 -Poorly controlled with hyperglycemia and hypoglycemia. Hemoglobin A1C of 8.9%. Seems mostly due to continued poor dietary choices. Partly iatrogenic secondary to attempting to adjust regimen for dietary indiscretions. -Continue Semglee  4 units daily and continue SSI, changed from [facility] to [facility].   Gastroparesis -Noted. Patient is not on medication management apart from insulin  for blood sugar control. Patient without symptoms and is tolerating his diet.   History of left BKA -Noted. Patient seen by PT/OT with recommendation for SNF on discharge.   Executive  dysfunction -Secondary to brain abscess. Patient started on Seroquel  for behavioral change management.   Pressure injury -Left buttock. Not present on admission.  Continue offloading, dressing change.  Goals of care - Can closely with TOC on disposition planning.  Patient in need of short-term rehab given his left arm paresis and left BKA.  Also needs dialysis set up for the outpatient setting once transitioned.  Case management not having much luck finding acceptance, still working on placement.  Meantime, will continue hemodialysis per nephrology.  Had a long conversation with the patient about this process and there is no easy means of discharge at this moment.  Will attempt to keep him apprised of updates.      Subjective: Evaluated this morning, alert and cooperative.  Also able to update the patient on any plans concerning his discharge/disposition.  Continue to reiterate to him about the process involved, will need to be set (dialysis, rehab, etc.) and that everything must be in line prior to discharge.  Otherwise denies fever, chills, chest pain, nausea, vomiting, abdominal pain.  Complaining of intermittent left hand throbbing that appears tolerable.   Physical Exam: Vitals:   11/07/23 2014 11/07/23 2115 11/08/23 0434 11/08/23 0724  BP: 117/79  (!) 109/51 (!) 132/90  Pulse: (!) 116 98 (!) 112 (!) 113  Resp: 18 20 18    Temp: (!) 97.4 F (36.3 C)  98.3 F (36.8 C)   TempSrc: Oral  Oral   SpO2: 100% 100% 93% 96%  Weight:      Height:       GENERAL:  Alert, pleasant, no acute distress, disheveled HEENT:  EOMI CARDIOVASCULAR: Regular and tachycardic  RESPIRATORY:  Clear to auscultation, no wheezing, rales, or rhonchi GASTROINTESTINAL:  Soft, nontender, mildly distended EXTREMITIES: Left AKA, LUE edema NEURO: LUE flaccid, no new focal deficits appreciated SKIN: Mild anasarca especially in the upper extremities and torso, right third toe dry gangrene, tender, scattered  excoriations in various stages of healing  PSYCH:  Appropriate mood and affect   Data Reviewed:  There are no new results to review at this time.  Family Communication: No family at bedside  Disposition: Status is: Inpatient Remains inpatient appropriate because: Continued hemodialysis  Planned Discharge Destination: Skilled nursing facility    Time spent: 35 minutes  Author: Jodeane Mulligan, DO 11/08/2023 11:52 AM  For on call review [email or web address].

## 2023-11-08 NOTE — Progress Notes (Signed)
 [facility] Progress Note   Subjective:    Seen and examined patient at bedside. Denies any acute issues. Awaiting SNF placement. Next HD 2/11.  Objective Vitals:   11/07/23 2014 11/07/23 2115 11/08/23 0434 11/08/23 0724  BP: 117/79  (!) 109/51 (!) 132/90  Pulse: (!) 116 98 (!) 112 (!) 113  Resp: 18 20 18    Temp: (!) 97.4 F (36.3 C)  98.3 F (36.8 C)   TempSrc: Oral  Oral   SpO2: 100% 100% 93% 96%  Weight:      Height:       Physical Exam General: Alert, chronically ill male NAD Heart: RRR no MRG Lungs: CTA bilaterally RA, unlabored Abdomen: NABS, soft NT ND, trace abdominal edema Extremities: Left AKA trace stump edema, RLE trace edema Dialysis Access: L FEM TDC dressing dry clear  Filed Weights   11/04/23 1725 11/06/23 1424 11/06/23 1734  Weight: 95 kg 97 kg 93.3 kg    Intake/Output Summary (Last 24 hours) at 11/08/2023 1105 Last data filed at 11/08/2023 0600 Gross per 24 hour  Intake 540 ml  Output 0 ml  Net 540 ml    Additional Objective Labs: Basic Metabolic Panel: Recent Labs  Lab 11/02/23 1511 11/04/23 1336 11/06/23 0509  NA 137 140 139  K 4.0 4.5 4.2  CL 96* 94* 93*  CO2 28 27 26   GLUCOSE 190* 146* 120*  BUN 31* 41* 31*  CREATININE 4.62* 6.21* 5.59*  CALCIUM  9.0 9.7 9.9  PHOS 5.5* 7.2* 6.3*   Liver Function Tests: Recent Labs  Lab 11/02/23 1511 11/04/23 1336 11/06/23 0509  ALBUMIN  3.5 3.2* 3.4*   No results for input(s): "LIPASE", "AMYLASE" in the last 168 hours. CBC: Recent Labs  Lab 11/01/23 2057 11/02/23 1029 11/04/23 1336 11/06/23 0509  WBC 5.8 4.7 4.0 3.8*  NEUTROABS  --   --  2.6 2.2  HGB 9.5* 8.8* 8.3* 9.1*  HCT 33.0* 30.6* 29.4* 30.9*  MCV 95.7 94.7 94.8 92.8  PLT 141* 143* 126* 141*   Blood Culture    Component Value Date/Time   SDES BLOOD SITE NOT SPECIFIED 08/25/2023 1706   SPECREQUEST  08/25/2023 1706    BOTTLES DRAWN AEROBIC ONLY Blood Culture results may not be optimal due to an inadequate  volume of blood received in culture bottles   CULT  08/25/2023 1706    NO GROWTH 5 DAYS Performed at [facility], [address] [place], [name] [address]    REPTSTATUS 08/30/2023 FINAL 08/25/2023 1706    Cardiac Enzymes: No results for input(s): "CKTOTAL", "CKMB", "CKMBINDEX", "TROPONINI" in the last 168 hours. CBG: Recent Labs  Lab 11/07/23 0751 11/07/23 1152 11/07/23 1644 11/07/23 2010 11/08/23 0725  GLUCAP 178* 165* 198* 179* 286*   Iron  Studies: No results for input(s): "IRON ", "TIBC", "TRANSFERRIN", "FERRITIN" in the last 72 hours. Lab Results  Component Value Date   INR 1.2 07/22/2020   INR 1.0 05/30/2020   INR 1.2 04/24/2019   Studies/Results: No results found.  Medications:  iron  sucrose 100 mg (11/06/23 1149)    (feeding supplement) PROSource Plus  30 mL Oral BID BM   apixaban   5 mg Oral BID   bacitracin    Topical Daily   Chlorhexidine  Gluconate Cloth  6 each Topical Q0600   darbepoetin (ARANESP ) injection - DIALYSIS  150 mcg Subcutaneous Q Sun-1800   gabapentin   100 mg Oral TID   Gerhardt's butt cream   Topical BID   insulin  aspart  0-6 Units Subcutaneous  TID AC & HS   insulin  glargine-yfgn  4 Units Subcutaneous Daily   loratadine   10 mg Oral Daily   midodrine   10 mg Oral Q T,Th,Sa-HD   pantoprazole   40 mg Oral Daily   povidone-Iodine    Topical Q0600   QUEtiapine   12.5 mg Oral q morning   QUEtiapine   25 mg Oral QHS   senna-docusate  2 tablet Oral BID   sevelamer  carbonate  2,400 mg Oral TID WC   sodium zirconium cyclosilicate   10 g Oral TID    Dialysis Orders: MWF - [facility] -> may be will be going to [place] [facility] on discharge 4hr, 400/A1.5, EDW 77kg, L femoral TDC, 2K/3Ca bath, no heparin   Assessment/Plan: 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 Completed on extensive IV Vanc course on 1/29. S/p transfer to [facility] and  drainage 08/31/24. Repeat brain MRI 10/17/23 showed improvement. Acute B CVA/septic emboli- due to above Acute LUE DVT: On Eliquis  ESRD: Transitioned to TTS. Next HD 2/11 HTN/volume: Didn't reach max UF 2/4 2nd low Bps from pain medication and signing off early. CT 10/09/23 with diffuse anasarca. Significantly above EDW but using bed weights. Max UF with HD. On Midodrine  with HD and okay to use IV Albumin  and extra Midodrine  dose PRN. Enforcing fluid restrictions/compliance with full HD time.  Anemia of ESRD: Hgbs 8-9s. Raised Aranesp  to . Iron  studies from today include: Iron  31, Tsat 15%, and Ferritin 140. On Fe load X 10 doses with HD-1st dose 2/8. Secondary HPTH:  CCA ^, Continue to hold calcitriol  for now. Sevelamer   ha been ^ to 3/meals.  Continues to have outside food and refuses any diet except regular (chronic problem with him despite counseling).phos 6.3 . Hyperkalemia: K+ 4.2, now stable. Continue Lokelma  10g TID for now, using low bath intermittently with HD.  As noted above diet indiscretion despite counseling Prostate fluid collection/abscess = noted per admit team urology consulted at outside hospital per their assessment "fluid collection was 2/2 dilated seminal vesicle with no surg.  Intervention recommended/ID concern for possible abscess patient covered with Vanc. and recommendation for outpatient urology follow-up" Nutrition: Alb 3.4,  continue supplements.  T1DM: per primary Dispo: SNF being arranged - not finalized yet  Jadene Maxwell, NP [facility] 11/08/2023,11:05 AM  LOS: 60 days

## 2023-11-08 NOTE — Progress Notes (Signed)
 Occupational Therapy Treatment Patient Details Name: [name] MRN: [id number] DOB: [birth year] Today's Date: 11/08/2023   History of present illness 43 y.o. male presents to [facility] 09/09/23 as a transfer from [facility] for further management of endocarditis. Pt originally was admitted to [facility] 07/09/23 for DKA and sepsis after being found on the ground. 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 10/17 pt had a R MCA, L occipital, and B cerebellum CVA w/ septic emboli. ICU admit 10/18 w/ intubation 10/18-10/24. Pt was transferred to [facility] for MV repair on 11/29. [facility], pt had cardiac cath w/ non obstructive disease, SAH, a craniotomy w/ abscess drainage on 12/4, and R third toe distal necrosis. PMH: chronic L AKA, MRSA, IDDM, ESRD on HD MWF, HTN, TEE positive for vegetation on multiple valves, cellulitis, medical noncompliance, narcotic dependence, DM with gastroparesis, L internal jugular DVT. (Simultaneous filing. User may not have seen previous data.)   OT comments  Patient received in supine and declined getting to EOB due to recently worked with PT on sitting on EOB but was agreeable to work on LUE SROM HEP. Patient requiring assistance to perform LUE shoulder flexion and extension and was able to perform elbow, wrist, and hand SROM with cues to perform correctly. Patient required mod assist for LUE positioning with Camillia Celeste to address edema. Patient performed RUE strengthening with red therapy band to increase strength to assist with bed mobility and transfers. Patient will benefit from continued inpatient follow up therapy, <3 hours/day. Acute OT to continue to follow to address established goals to facilitate DC to next venue of care.       If plan is discharge home, recommend the following:  Two people to help with walking and/or transfers;Two people to help with bathing/dressing/bathroom;Assistance with feeding;Direct supervision/assist for  financial management;Direct supervision/assist for medications management;Assist for transportation;Help with stairs or ramp for entrance;Assistance with cooking/housework   Equipment Recommendations  Wheelchair (measurements OT);Wheelchair cushion (measurements OT);Hospital bed;Hoyer lift    Recommendations for Other Services      Precautions / Restrictions Precautions Precautions: Fall;Other (comment) (Simultaneous filing. User may not have seen previous data.) Precaution Comments: L AKA, L hemiparesis and edema, L neglect, bowel incontinence. L femoral HD cath (Simultaneous filing. User may not have seen previous data.) Restrictions Weight Bearing Restrictions Per Provider Order: No (Simultaneous filing. User may not have seen previous data.) LLE Weight Bearing Per Provider Order: Non weight bearing Other Position/Activity Restrictions: Pt has sling for LUE when mobilizing OOB; LLE prosthetic not fitting       Mobility Bed Mobility Overal bed mobility: Needs Assistance (Simultaneous filing. User may not have seen previous data.) Bed Mobility: Rolling (Simultaneous filing. User may not have seen previous data.) Rolling: Mod assist (Simultaneous filing. User may not have seen previous data.)         General bed mobility comments: assistance with positioning in bed (Simultaneous filing. User may not have seen previous data.)    Transfers                   General transfer comment: patient declined sitting on EOB due to recently went EOB with PT (Simultaneous filing. User may not have seen previous data.)     Balance  ADL either performed or assessed with clinical judgement   ADL Overall ADL's : Needs assistance/impaired Eating/Feeding: Set up;Bed level Eating/Feeding Details (indicate cue type and reason): able to manage cup                                   General ADL Comments: focused on  LUE PROM/SROM and RUE strengthening    Extremity/Trunk Assessment Upper Extremity Assessment Upper Extremity Assessment: Right hand dominant;LUE deficits/detail LUE Deficits / Details: edematous L UE from shoulder to hand; pt hand with clawing position at rest; hemiparesis LUE Sensation: decreased light touch;decreased proprioception LUE Coordination: decreased fine motor;decreased gross motor            Vision       Perception     Praxis      Cognition       Area of Impairment: Attention, Memory, Following commands, Problem solving                   Current Attention Level: Selective           General Comments: patient reporting "bad day" due to last nights game and staying up late        Exercises Exercises: General Upper Extremity General Exercises - Upper Extremity Shoulder Flexion: PROM, Left, 10 reps, Supine, Strengthening, Right, Theraband Theraband Level (Shoulder Flexion): Level 2 (Red) Shoulder Extension: PROM, Left, 10 reps, Supine Shoulder ABduction: PROM, Left, 10 reps Shoulder ADduction: PROM, Left, 10 reps, Supine Elbow Flexion: PROM, Self ROM, Left, 10 reps, Supine, Strengthening, Right, Theraband Theraband Level (Elbow Flexion): Level 2 (Red) Elbow Extension: PROM, Self ROM, Left, Supine, Strengthening, 10 reps, Theraband Theraband Level (Elbow Extension): Level 2 (Red) Wrist Flexion: PROM, Self ROM, Left, Other reps (comment), Supine Wrist Extension: PROM, Self ROM, Both, Other reps (comment), Supine Digit Composite Flexion: PROM, Self ROM, Left, Other reps (comment), Supine Composite Extension: PROM, Self ROM, Left, Supine, Other (comment)    Shoulder Instructions       General Comments Patient declining getting to EOB due to recently working with PT and states he is having a "bad day" due to last nights game and lack of sleep (Simultaneous filing. User may not have seen previous data.)    Pertinent Vitals/ Pain          Home  Living                                          Prior Functioning/Environment              Frequency  Min 1X/week        Progress Toward Goals  OT Goals(current goals can now be found in the care plan section)  Progress towards OT goals: Not progressing toward goals - comment (limited progress due to patient not willing to get to EOB)  Acute Rehab OT Goals Patient Stated Goal: feel better OT Goal Formulation: With patient Time For Goal Achievement: 11/09/23 Potential to Achieve Goals: Good ADL Goals Pt Will Perform Grooming: with modified independence;sitting (chair level) Pt Will Perform Lower Body Bathing: with min assist;bed level Pt Will Perform Lower Body Dressing: with mod assist;bed level;with adaptive equipment Pt Will Transfer to Toilet:  (bed level change of brief) Pt Will Perform Toileting - Clothing Manipulation and hygiene: with mod assist;sitting/lateral  leans Pt/caregiver will Perform Home Exercise Program: Left upper extremity;Independently (place in carter block splint for elevation) Additional ADL Goal #1: pt will demonstrate following 2 step command 50% of attempts Additional ADL Goal #2: pt will demonstrate bed Rolling transfer with hooking R UE 100% of attempts Additional ADL Goal #3: Pt will static sit EOB CGA for 3 minutes  Plan      Co-evaluation                 AM-PAC OT "6 Clicks" Daily Activity     Outcome Measure   Help from another person eating meals?: A Little Help from another person taking care of personal grooming?: A Little Help from another person toileting, which includes using toliet, bedpan, or urinal?: A Little Help from another person bathing (including washing, rinsing, drying)?: A Lot Help from another person to put on and taking off regular upper body clothing?: A Little Help from another person to put on and taking off regular lower body clothing?: A Lot 6 Click Score: 16    End of Session     OT Visit Diagnosis: Other symptoms and signs involving cognitive function;Hemiplegia and hemiparesis Hemiplegia - Right/Left: Left Hemiplegia - dominant/non-dominant: Non-Dominant Hemiplegia - caused by: Cerebral infarction   Activity Tolerance Patient tolerated treatment well   Patient Left in bed;with call bell/phone within reach;with bed alarm set   Nurse Communication Mobility status        Time: 9604-5409 OT Time Calculation (min): 36 min  Charges: OT General Charges $OT Visit: 1 Visit OT Treatments $Therapeutic Activity: 8-22 mins $Therapeutic Exercise: 8-22 mins  Anitra Barn, OTA Acute Rehabilitation Services  Office [phone]   Jovita Nipper 11/08/2023, 2:59 PM

## 2023-11-09 LAB — RENAL FUNCTION PANEL
Albumin: 3.4 g/dL — ABNORMAL LOW (ref 3.5–5.0)
Anion gap: 16 — ABNORMAL HIGH (ref 5–15)
BUN: 44 mg/dL — ABNORMAL HIGH (ref 6–20)
CO2: 27 mmol/L (ref 22–32)
Calcium: 9.1 mg/dL (ref 8.9–10.3)
Chloride: 94 mmol/L — ABNORMAL LOW (ref 98–111)
Creatinine, Ser: 7.16 mg/dL — ABNORMAL HIGH (ref 0.61–1.24)
GFR, Estimated: 9 mL/min — ABNORMAL LOW (ref >60)
Glucose, Bld: 213 mg/dL — ABNORMAL HIGH (ref 70–99)
Phosphorus: 7.7 mg/dL — ABNORMAL HIGH (ref 2.5–4.6)
Potassium: 4.6 mmol/L (ref 3.5–5.1)
Sodium: 137 mmol/L (ref 135–145)

## 2023-11-09 LAB — CBC WITH DIFFERENTIAL/PLATELET
Abs Immature Granulocytes: 0.01 10*3/uL (ref 0.00–0.07)
Basophils Absolute: 0 10*3/uL (ref 0.0–0.1)
Basophils Relative: 1 %
Eosinophils Absolute: 0.3 10*3/uL (ref 0.0–0.5)
Eosinophils Relative: 7 %
HCT: 30.9 % — ABNORMAL LOW (ref 39.0–52.0)
Hemoglobin: 8.8 g/dL — ABNORMAL LOW (ref 13.0–17.0)
Immature Granulocytes: 0 %
Lymphocytes Relative: 22 %
Lymphs Abs: 1 10*3/uL (ref 0.7–4.0)
MCH: 27.2 pg (ref 26.0–34.0)
MCHC: 28.5 g/dL — ABNORMAL LOW (ref 30.0–36.0)
MCV: 95.4 fL (ref 80.0–100.0)
Monocytes Absolute: 0.3 10*3/uL (ref 0.1–1.0)
Monocytes Relative: 6 %
Neutro Abs: 3 10*3/uL (ref 1.7–7.7)
Neutrophils Relative %: 64 %
Platelets: 132 10*3/uL — ABNORMAL LOW (ref 150–400)
RBC: 3.24 MIL/uL — ABNORMAL LOW (ref 4.22–5.81)
RDW: 16.3 % — ABNORMAL HIGH (ref 11.5–15.5)
WBC: 4.7 10*3/uL (ref 4.0–10.5)
nRBC: 0 % (ref 0.0–0.2)

## 2023-11-09 LAB — GLUCOSE, CAPILLARY
Glucose-Capillary: 100 mg/dL — ABNORMAL HIGH (ref 70–99)
Glucose-Capillary: 184 mg/dL — ABNORMAL HIGH (ref 70–99)
Glucose-Capillary: 162 mg/dL — ABNORMAL HIGH (ref 70–99)

## 2023-11-09 NOTE — Plan of Care (Signed)
Problem: Clinical Measurements: Goal: Ability to maintain clinical measurements within normal limits will improve Outcome: Progressing Goal: Will remain free from infection Outcome: Progressing

## 2023-11-09 NOTE — Progress Notes (Signed)
Progress Note   Patient: [name] BJY:[id number] DOB: 1981/08/25 DOA: 09/09/2023     61 DOS: the patient was seen and examined on 11/09/2023   Brief hospital course: [name] is a 43 y.o. male with medical history significant of ESRD on hemodialysis (not fully compliant), insulin-dependent type 1 diabetes, gastroparesis, Left AKA, admitted to [facility] on 07/09/23 for DKA, Sepsis and hospital course significant for MV AND TV  MRSA endocarditis, followed by acute MCA stroke , left occipital and bilateral cerebellar strokes consistent with septic emboli,  Brain abscess from MRSA, left IJ DVT was transferred to [facility] for MV repair on 11/29.  While at [facility] he underwent cardiac cath showing non obstructive disease, craniotomy with abscess drainage on 09/01/23,. He had subarachnoid hemorrhage while on IV heparin, right third toe distal necrosis,was seen by podiatry, but family wanted conservative management. Since his tertiary care needs have been completed, he was transferred back to [facility] for further management.  PTOT  recommending SNF and awaiting placement.  Assessment and Plan:  MRSA TV/MV endocarditis-resolved -Positive MRSA blood cultures. Diagnosed on Transesophageal Echocardiogram. Patient managed on Vancomycin. At outside hospital, patient underwent a left heart catheterization which was significant for nonobstructive disease. Infectious disease recommendations to continue Vancomycin IV and patient has completed his 8 week treatment course.   Prostate fluid collection/abscess-resolved -Urology consulted at outside hospital and per their assessment, fluid collection was secondary to dilated seminal vesicle with no surgical intervention recommended. Infectious disease with continued concern for possible abscess. Patient covered with vancomycin with recommendation for outpatient urology follow-up.   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 Patient evaluated  by neurosurgery. Concern for abscess ruled out by neurosurgery initially. No surgical intervention per neurosurgery. However, at outside hospital, patient was evaluated by neurosurgery who performed a craniotomy for abscess drainage.   ESRD on HD -Nephrology following closely and managing hemodialysis (TueThSat).   Hyperkalemia -Improved with HD.   Anemia of chronic kidney disease -Hemoglobin stable.   CKD mineral bone disease -Continue calcitriol   Acute CVA -MRI confirms large right MCA branch infarct. MRA head (11/25) significant for large necrotic appearing lesion within the right frontal lobe within area of the prior right MCA territory infarct with vasogenic edema and 8 mm leftward midline shift. LDL of 34. Hemoglobin A1C of 8.9%. Transthoracic Echocardiogram (10/13) significant for an LVEF of 25-30% with concern for AV/MV abnormality with Transesophageal Echocardiogram (10/16 & 10/22) significant for MV/TV vegetations. Neurology recommendations for Eliquis. PT/OT recommendations for SNF.  Patient with significant left upper extremity paresis.   Primary hypertension -Patient is on metoprolol as an outpatient. Metoprolol held secondary to hypotension with dialysis.   Acute left upper extremity DVT -Continue Eliquis   Right third toe necrosis -Noted. Per documentation, patient/family opting for conservative management and decline surgical management.  Was able to talk to patient's mother today (2/11) who brought up to the idea of possibly having podiatry involved.  Stated that if no surgical intervention is going to be pursued at this time likely may not be of benefit.  Does not appear acutely infected.  Will continue conservative management at this time.  However if necrosis spreads or becomes infected, may warrant discussion with podiatry/surgery.    Diabetes mellitus type 1 -Poorly controlled with hyperglycemia and hypoglycemia. Hemoglobin A1C of 8.9%. Seems mostly due to continued  poor dietary choices. Partly iatrogenic secondary to attempting to adjust regimen for dietary indiscretions. -Continue Semglee 4 units daily and continue SSI, changed  from [facility] to [facility].   Gastroparesis -Noted. Patient is not on medication management apart from insulin for blood sugar control. Patient without symptoms and is tolerating his diet.   History of left AKA -Noted. Patient seen by PT/OT with recommendation for SNF on discharge.   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 Patient started on Seroquel for behavioral change management.  Appears to be improving.  Patient very conversant, A&O x 4.   Pressure injury -Left buttock. Not present on admission.  Continue offloading, dressing change.  Encouraged patient himself to rotate and offload.  Goals of care - Can closely with TOC on disposition planning.  Patient in need of short-term rehab given his left arm paresis and left AKA.  Also needs dialysis set up for the outpatient setting once transitioned.  Case management not having much luck finding acceptance, still working on placement.  Meantime, will continue hemodialysis per nephrology.  Had a long conversation with the patient about this process and there is no easy means of discharge at this moment.  Will attempt to keep him apprised of updates.  Also spoke to the patient's mother [name] - [phone]) today (2/11), gave her an overall update of his status and plan.  Reiterated that he does not currently have a dispo plan given there is no bed availability.  Case management still working to find accepting facility as well as outpatient hemodialysis chair.  [name] was appreciative of the update.      Subjective: Evaluated this morning, alert and cooperative.  Also able to update the patient on any plans concerning his discharge/disposition.  Continue to reiterate to him about the process involved, will need to be set (dialysis, rehab, etc.) and that everything  must be in line prior to discharge.  Patient admitted he is attempting to work with PT/OT more.  Has bands tied at the bedside to help with physical deconditioning/bursitis.  Otherwise denies fever, chills, chest pain, nausea, vomiting, abdominal pain.  Scheduled for hemodialysis later this morning.  Physical Exam: Vitals:   11/08/23 0724 11/08/23 1959 11/09/23 0511 11/09/23 0741  BP: (!) 132/90 124/82 (!) 81/36 101/83  Pulse: (!) 113 100 (!) 110   Resp:  18 18 18   Temp:  97.6 F (36.4 C) 97.9 F (36.6 C) 98.3 F (36.8 C)  TempSrc:  Oral Oral   SpO2: 96% 96% 95%   Weight:      Height:       GENERAL:  Alert, pleasant, no acute distress, disheveled HEENT:  EOMI CARDIOVASCULAR: Regular and tachycardic  RESPIRATORY:  Clear to auscultation, no wheezing, rales, or rhonchi GASTROINTESTINAL:  Soft, nontender, mildly distended EXTREMITIES: Left AKA, LUE pitting edema NEURO: LUE flaccid, no new focal deficits appreciated SKIN: Mild anasarca especially in the upper extremities and torso, right third toe dry gangrene, tender, scattered excoriations in various stages of healing  PSYCH:  Appropriate mood and affect   Data Reviewed:  There are no new results to review at this time.  Family Communication: No family at bedside  Disposition: Status is: Inpatient Remains inpatient appropriate because: Continued hemodialysis  Planned Discharge Destination: Skilled nursing facility    Time spent: 45 minutes  Author: Deanna Artis, DO 11/09/2023 12:05 PM  For on call review [email or web address].

## 2023-11-09 NOTE — Progress Notes (Addendum)
Pt goal met.oxycodone 5 mg po given during TX.  11/09/23 1930  Vitals  Temp 98 F (36.7 C)  Temp Source Oral  BP (!) 99/56  BP Location Right Arm  BP Method Automatic  Patient Position (if appropriate) Lying  Pulse Rate (!) 115  Resp 14  Oxygen Therapy  SpO2 100 %  O2 Device Nasal Cannula  O2 Flow Rate (L/min) 4 L/min  During Treatment Monitoring  HD Safety Checks Performed Yes  Intra-Hemodialysis Comments Tx completed  Post Treatment  Dialyzer Clearance Lightly streaked  Hemodialysis Intake (mL) 0 mL  Liters Processed 70  Fluid Removed (mL) 3600 mL  Tolerated HD Treatment Yes  Post-Hemodialysis Comments Pt goal met.  Hemodialysis Catheter Left Femoral vein Double lumen Permanent (Tunneled)  Placement Date/Time: 07/16/23 1318   Serial / Lot #: [id number]  Expiration Date: 05/24/26  Time Out: Correct patient;Correct site;Correct procedure  Maximum sterile barrier precautions: Hand hygiene;Cap;Mask;Sterile gown;Sterile gloves;Large sterile ...  Site Condition No complications  Blue Lumen Status Heparin locked  Red Lumen Status Heparin locked  Catheter fill solution Heparin 1000 units/ml  Catheter fill volume (Arterial) 2.3 cc  Catheter fill volume (Venous) 2.3  Dressing Type Transparent;Gauze/Drain sponge  Dressing Status Antimicrobial disc/dressing in place;Clean, Dry, Intact  Interventions New dressing  Drainage Description None  Dressing Change Due 11/11/23  Post treatment catheter status Capped and Clamped

## 2023-11-09 NOTE — Progress Notes (Addendum)
[facility] Progress Note   Subjective:    Seen and examined patient at bedside. No acute complaints. Plan for HD today.  Objective Vitals:   11/08/23 0724 11/08/23 1959 11/09/23 0511 11/09/23 0741  BP: (!) 132/90 124/82 (!) 81/36 101/83  Pulse: (!) 113 100 (!) 110   Resp:  18 18 18   Temp:  97.6 F (36.4 C) 97.9 F (36.6 C) 98.3 F (36.8 C)  TempSrc:  Oral Oral   SpO2: 96% 96% 95%   Weight:      Height:       Physical Exam General: Alert, chronically ill male NAD Heart: RRR no MRG Lungs: CTA bilaterally RA, unlabored Abdomen: NABS, soft NT ND, trace abdominal edema Extremities: Left AKA trace stump edema, RLE trace edema Dialysis Access: L FEM TDC dressing dry clear  Filed Weights   11/04/23 1725 11/06/23 1424 11/06/23 1734  Weight: 95 kg 97 kg 93.3 kg    Intake/Output Summary (Last 24 hours) at 11/09/2023 1032 Last data filed at 11/09/2023 0500 Gross per 24 hour  Intake 480 ml  Output 0 ml  Net 480 ml    Additional Objective Labs: Basic Metabolic Panel: Recent Labs  Lab 11/02/23 1511 11/04/23 1336 11/06/23 0509  NA 137 140 139  K 4.0 4.5 4.2  CL 96* 94* 93*  CO2 28 27 26   GLUCOSE 190* 146* 120*  BUN 31* 41* 31*  CREATININE 4.62* 6.21* 5.59*  CALCIUM 9.0 9.7 9.9  PHOS 5.5* 7.2* 6.3*   Liver Function Tests: Recent Labs  Lab 11/02/23 1511 11/04/23 1336 11/06/23 0509  ALBUMIN 3.5 3.2* 3.4*   No results for input(s): "LIPASE", "AMYLASE" in the last 168 hours. CBC: Recent Labs  Lab 11/04/23 1336 11/06/23 0509  WBC 4.0 3.8*  NEUTROABS 2.6 2.2  HGB 8.3* 9.1*  HCT 29.4* 30.9*  MCV 94.8 92.8  PLT 126* 141*   Blood Culture    Component Value Date/Time   SDES BLOOD SITE NOT SPECIFIED 08/25/2023 1706   SPECREQUEST  08/25/2023 1706    BOTTLES DRAWN AEROBIC ONLY Blood Culture results may not be optimal due to an inadequate volume of blood received in culture bottles   CULT  08/25/2023 1706    NO GROWTH 5 DAYS Performed at [facility], [address], [place], [name] [address]    REPTSTATUS 08/30/2023 FINAL 08/25/2023 1706    Cardiac Enzymes: No results for input(s): "CKTOTAL", "CKMB", "CKMBINDEX", "TROPONINI" in the last 168 hours. CBG: Recent Labs  Lab 11/08/23 0725 11/08/23 1159 11/08/23 1721 11/08/23 1955 11/09/23 0735  GLUCAP 286* 284* 203* 198* 100*   Iron Studies: No results for input(s): "IRON", "TIBC", "TRANSFERRIN", "FERRITIN" in the last 72 hours. Lab Results  Component Value Date   INR 1.2 07/22/2020   INR 1.0 05/30/2020   INR 1.2 04/24/2019   Studies/Results: No results found.  Medications:  iron sucrose 100 mg (11/06/23 1149)    (feeding supplement) PROSource Plus  30 mL Oral BID BM   apixaban  5 mg Oral BID   bacitracin   Topical Daily   Chlorhexidine Gluconate Cloth  6 each Topical Q0600   darbepoetin (ARANESP) injection - DIALYSIS  150 mcg Subcutaneous Q Sun-1800   gabapentin  100 mg Oral TID   Gerhardt's butt cream   Topical BID   insulin aspart  0-6 Units Subcutaneous TID AC & HS   insulin glargine-yfgn  4 Units Subcutaneous Daily   loratadine  10 mg Oral Daily  midodrine  10 mg Oral Q T,Th,Sa-HD   pantoprazole  40 mg Oral Daily   povidone-Iodine   Topical Q0600   QUEtiapine  12.5 mg Oral q morning   QUEtiapine  25 mg Oral QHS   senna-docusate  2 tablet Oral BID   sevelamer carbonate  2,400 mg Oral TID WC   sodium zirconium cyclosilicate  10 g Oral TID    Dialysis Orders: MWF - [facility] -> may be will be going to [place] [facility] on discharge 4hr, 400/A1.5, EDW 77kg, L femoral TDC, 2K/3Ca bath, no heparin  Assessment/Plan: 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 Completed on extensive IV Vanc course on 1/29. S/p transfer to [facility] and drainage 08/31/24. Repeat brain MRI 10/17/23 showed improvement. Acute B CVA/septic emboli- due to  above Acute LUE DVT: On Eliquis ESRD: Transitioned to TTS. Next HD today HTN/volume: Didn't reach max UF 2/4 2nd low Bps from pain medication and signing off early. CT 10/09/23 with diffuse anasarca. Significantly above EDW but using bed weights. Max UF with HD. On Midodrine with HD and okay to use IV Albumin and extra Midodrine dose PRN. Enforcing fluid restrictions/compliance with full HD time.  Anemia of ESRD: Hgbs 8-9s. Raised Aranesp to . Iron studies from today include: Iron 31, Tsat 15%, and Ferritin 140. On Fe load X 10 doses with HD-1st dose 2/8. Checking labs in AM. Secondary HPTH:  CCA ^, Continue to hold calcitriol for now. Sevelamer  ha been ^ to 3/meals.  Continues to have outside food and refuses any diet except regular (chronic problem with him despite counseling).phos 6.3 . Hyperkalemia: K+ 4.2, now stable. Continue Lokelma 10g TID for now, using low bath intermittently with HD.  As noted above diet indiscretion despite counseling. Checking labs in AM. Prostate fluid collection/abscess = noted per admit team urology consulted at outside hospital per their assessment "fluid collection was 2/2 dilated seminal vesicle with no surg.  Intervention recommended/ID concern for possible abscess patient covered with Vanc. and recommendation for outpatient urology follow-up" Nutrition: Alb 3.4,  continue supplements.  T1DM: per primary Dispo: SNF being arranged - not finalized yet  Salome Holmes, NP [facility] 11/09/2023,10:32 AM  LOS: 61 days

## 2023-11-09 NOTE — TOC Progression Note (Signed)
Transition of Care [facility]) - Progression Note    Patient Details  Name: [name] MRN: [id number] Date of Birth: 1981-08-29  Transition of Care [facility]) CM/SW Contact  Carley Hammed, LCSW Phone Number: 11/09/2023, 2:36 PM  Clinical Narrative:    CSW was advised by MD that pt would like for social work to update him in person. CSW met with pt at bedside, pt agitated asking why there has been no movement. CSW attempted to explain that a SNF has not accepted yet and the HD center would also need to be finalized. Pt asked what the team was doing, CSW educated on the process. TOC will continue to follow.    Expected Discharge Plan: Skilled Nursing Facility Barriers to Discharge: Continued Medical Work up  Expected Discharge Plan and Services                                               Social Determinants of Health (SDOH) Interventions SDOH Screenings   Food Insecurity: No Food Insecurity (09/09/2023)  Housing: Unknown (09/09/2023)  Transportation Needs: No Transportation Needs (09/09/2023)  Utilities: Not At Risk (09/09/2023)  Alcohol Screen: Low Risk  (02/15/2023)  Depression (PHQ2-9): Low Risk  (02/15/2023)  Financial Resource Strain: Low Risk  (02/15/2023)  Physical Activity: Inactive (02/15/2023)  Social Connections: Socially Integrated (02/15/2023)  Stress: No Stress Concern Present (02/15/2023)  Tobacco Use: Low Risk  (09/12/2023)  Recent Concern: Tobacco Use - Medium Risk (09/01/2023)   Received from [facility]    Readmission Risk Interventions     No data to display

## 2023-11-10 LAB — RENAL FUNCTION PANEL
Albumin: 3.6 g/dL (ref 3.5–5.0)
Anion gap: 17 — ABNORMAL HIGH (ref 5–15)
BUN: 31 mg/dL — ABNORMAL HIGH (ref 6–20)
CO2: 27 mmol/L (ref 22–32)
Calcium: 9.6 mg/dL (ref 8.9–10.3)
Chloride: 92 mmol/L — ABNORMAL LOW (ref 98–111)
Creatinine, Ser: 5.34 mg/dL — ABNORMAL HIGH (ref 0.61–1.24)
GFR, Estimated: 13 mL/min — ABNORMAL LOW (ref >60)
Glucose, Bld: 91 mg/dL (ref 70–99)
Phosphorus: 6.2 mg/dL — ABNORMAL HIGH (ref 2.5–4.6)
Potassium: 4.5 mmol/L (ref 3.5–5.1)
Sodium: 136 mmol/L (ref 135–145)

## 2023-11-10 LAB — GLUCOSE, CAPILLARY
Glucose-Capillary: 85 mg/dL (ref 70–99)
Glucose-Capillary: 108 mg/dL — ABNORMAL HIGH (ref 70–99)
Glucose-Capillary: 205 mg/dL — ABNORMAL HIGH (ref 70–99)
Glucose-Capillary: 280 mg/dL — ABNORMAL HIGH (ref 70–99)

## 2023-11-10 LAB — CBC WITH DIFFERENTIAL/PLATELET
Abs Immature Granulocytes: 0.01 10*3/uL (ref 0.00–0.07)
Basophils Absolute: 0 10*3/uL (ref 0.0–0.1)
Basophils Relative: 1 %
Eosinophils Absolute: 0.3 10*3/uL (ref 0.0–0.5)
Eosinophils Relative: 8 %
HCT: 34.4 % — ABNORMAL LOW (ref 39.0–52.0)
Hemoglobin: 10.1 g/dL — ABNORMAL LOW (ref 13.0–17.0)
Immature Granulocytes: 0 %
Lymphocytes Relative: 25 %
Lymphs Abs: 1 10*3/uL (ref 0.7–4.0)
MCH: 27.4 pg (ref 26.0–34.0)
MCHC: 29.4 g/dL — ABNORMAL LOW (ref 30.0–36.0)
MCV: 93.5 fL (ref 80.0–100.0)
Monocytes Absolute: 0.3 10*3/uL (ref 0.1–1.0)
Monocytes Relative: 9 %
Neutro Abs: 2.3 10*3/uL (ref 1.7–7.7)
Neutrophils Relative %: 57 %
Platelets: DECREASED 10*3/uL (ref 150–400)
RBC: 3.68 MIL/uL — ABNORMAL LOW (ref 4.22–5.81)
RDW: 16.5 % — ABNORMAL HIGH (ref 11.5–15.5)
WBC: 4 10*3/uL (ref 4.0–10.5)
nRBC: 0 % (ref 0.0–0.2)

## 2023-11-10 MED ORDER — HYDROCORTISONE ACETATE 25 MG RE SUPP
25.0000 mg | Freq: Two times a day (BID) | RECTAL | Status: DC
  Administered 2023-11-23: 25 mg via RECTAL
  Filled 2023-11-10 (×29): qty 1

## 2023-11-10 MED ORDER — CHLORHEXIDINE GLUCONATE CLOTH 2 % EX PADS
6.0000 | MEDICATED_PAD | Freq: Every day | CUTANEOUS | Status: DC
  Administered 2023-11-11 – 2023-11-12 (×2): 6 via TOPICAL

## 2023-11-10 MED ORDER — DOCUSATE SODIUM 100 MG PO CAPS
100.0000 mg | ORAL_CAPSULE | Freq: Two times a day (BID) | ORAL | Status: DC
  Administered 2023-11-20 – 2023-11-23 (×2): 100 mg via ORAL
  Filled 2023-11-10 (×19): qty 1

## 2023-11-10 MED ORDER — POLYETHYLENE GLYCOL 3350 17 G PO PACK
17.0000 g | PACK | Freq: Every day | ORAL | Status: DC
  Filled 2023-11-10 (×8): qty 1

## 2023-11-10 MED ORDER — LACTULOSE 10 GM/15ML PO SOLN: 20.0000 g | Freq: Once | ORAL | Status: DC

## 2023-11-10 NOTE — Progress Notes (Signed)
Orthopedic Tech Progress Note Patient Details:  [name] [birth year] [id number]  Ortho Devices Type of Ortho Device: Postop shoe/boot Ortho Device/Splint Location: RLE Ortho Device/Splint Interventions: Ordered, Application, Adjustment, Removal   Post Interventions Patient Tolerated: Well Instructions Provided: Care of device  Donald Pore 11/10/2023, 11:02 AM

## 2023-11-10 NOTE — Progress Notes (Signed)
Physical Therapy Treatment Patient Details Name: [name] MRN: [id number] DOB: 1981/01/23 Today's Date: 11/10/2023   History of Present Illness 43 y.o. male presents to [facility] 09/09/23 as a transfer from [facility] for further management of endocarditis. Pt originally was admitted to [facility] 07/09/23 for DKA and sepsis after being found on the ground. 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 10/17 pt had a R MCA, L occipital, and B cerebellum CVA w/ septic emboli. ICU admit 10/18 w/ intubation 10/18-10/24. Pt was transferred to [facility] for MV repair on 11/29. [facility], pt had cardiac cath w/ non obstructive disease, SAH, a craniotomy w/ abscess drainage on 12/4, and R third toe distal necrosis. PMH: chronic L AKA, MRSA, IDDM, ESRD on HD MWF, HTN, TEE positive for vegetation on multiple valves, cellulitis, medical noncompliance, narcotic dependence, DM with gastroparesis, L internal jugular DVT.    PT Comments  Continuing work on functional mobility and activity tolerance; planned with Mobility to begin with exercise exercises with Mobility Specialist , then PT come in to work on mobility and transfers; rationale that often [name] needs to talk through a lot before we are able to get to physical practice, so we were hopeful that we could be more efficient with our time in session. This try did not fully pan out today.  We began PT with the intention of working to push up on his right side to sit edge of bed and continue the work weight shifting onto his right foot for functional scooting transfers; assisted pt in donning postop shoe, so he would have a more stable basis support to work from for transfer training;  [name] has difficulty rolling onto his right side, and tends to reach for his assist to pull to sit; we fashioned sheet roll and attached it to the foot of his bed to see if it could help him pull to sit; ultimately the angle was not favorable for pulling to  sit from an almost flat position;  I'm afraid that this big challenge of pushing up to sitting, and the difficulty with this task was overwhelming to [name], and effected his ability to engage and participate; but after a lot of conversation, including when [name] himself stated goals of being able to move and do more, he did come to sit at your bed to take his medication;  I believe at this point it is worth considering a psych consult for help in supporting [name] to make choices that will help him meet his goals;   Is there any plan to change his HD access?  We can't use the L LE prosthesis for risk of losing his L femoral HD catheter -- and I wonder if being able to get back to prosthesis trng would be meaningful and motivating for [name]   If plan is discharge home, recommend the following: Two people to help with walking and/or transfers;Assist for transportation;Supervision due to cognitive status;Help with stairs or ramp for entrance;Two people to help with bathing/dressing/bathroom;Assistance with cooking/housework;Direct supervision/assist for financial management;Direct supervision/assist for medications management   Can travel by private vehicle     No  Equipment Recommendations  Hoyer lift;Wheelchair (measurements PT);Wheelchair cushion (measurements PT);Other (comment)    Recommendations for Other Services       Precautions / Restrictions Precautions Precautions: Fall Precaution/Restrictions Comments: L AKA, L hemiparesis and edema, L neglect, bowel incontinence. L femoral HD cath Restrictions Other Position/Activity Restrictions: Pt has sling for LUE when mobilizing  OOB; sling is very uncomfortable (try to figure out a way to secure LUE without pulling on his neck); must hold on using LLE prosthesis while femoral HD access; not fitting well either     Mobility  Bed Mobility Overal bed mobility: Needs Assistance Bed Mobility: Rolling, Supine to Sit, Sit to Supine Rolling: Mod  assist, Max assist   Supine to sit: Max assist, +2 for physical assistance          Transfers                   General transfer comment: Declined transfer training    Ambulation/Gait                   Stairs             Wheelchair Mobility     Tilt Bed    Modified Rankin (Stroke Patients Only) Modified Rankin (Stroke Patients Only) Pre-Morbid Rankin Score: Moderate disability Modified Rankin: Severe disability     Balance     Sitting balance-[name]: Fair                                      Communication Communication Communication: Impaired Factors Affecting Communication: Other (comment) (Requires increased time for processing. Benefits from use of short, simple sentences. Often perseverates on topics.)  Cognition Arousal: Alert Behavior During Therapy: Agitated, WFL for tasks assessed/performed (Initially agitated with pt perseverating on frustration with being taken to HD late yesterday and it interfering with his meal schedule. With active listening and increased time, pt calmed and was able to more actively participate in session.)                             Following commands: Impaired (Pt currently demonstrating ability to follow 2-step instructions appropriately in 6/10 attempts with destractions minimized in room (ex. only one staff present, TV sound off, etc.). Pt also with improved instruction following during preferred tasks.) Following commands impaired: Only follows one step commands consistently, Follows one step commands with increased time, Follows multi-step commands inconsistently    Cueing Cueing Techniques: Verbal cues, Tactile cues  Exercises      General Comments        Pertinent Vitals/Pain Pain Assessment Pain Assessment: Faces Faces Pain Scale: Hurts a little bit (with bed mobility; sitting EOB) Pain Location: buttock and wounds Pain Descriptors / Indicators: Sore,  Discomfort, Grimacing Pain Intervention(s): Monitored during session    Home Living                          Prior Function            PT Goals (current goals can now be found in the care plan section) Acute Rehab PT Goals Patient Stated Goal: rehab at SNF PT Goal Formulation: With patient Time For Goal Achievement: 11/09/23 Potential to Achieve Goals: Fair Progress towards PT goals:  (Variable abiity to participate)    Frequency    Min 1X/week      PT Plan      Co-evaluation PT/OT/SLP Co-Evaluation/Treatment: Yes (dovetail)            AM-PAC PT "6 Clicks" Mobility   Outcome Measure  Help needed turning from your back to your side while in a flat bed without using bedrails?: A  Lot Help needed moving from lying on your back to sitting on the side of a flat bed without using bedrails?: A Lot Help needed moving to and from a bed to a chair (including a wheelchair)?: Total Help needed standing up from a chair using your arms (e.g., wheelchair or bedside chair)?: Total Help needed to walk in hospital room?: Total Help needed climbing 3-5 steps with a railing? : Total 6 Click Score: 8    End of Session Equipment Utilized During Treatment: Other (comment) (bed pad) Activity Tolerance: Other (comment) (self-limiting) Patient left: Other (comment) (with OT)   PT Visit Diagnosis: Other abnormalities of gait and mobility (R26.89);Other symptoms and signs involving the nervous system (R29.898);Hemiplegia and hemiparesis Hemiplegia - Right/Left: Left Hemiplegia - dominant/non-dominant: Non-dominant Hemiplegia - caused by: Cerebral infarction     Time: 1205-1244 PT Time Calculation (min) (ACUTE ONLY): 39 min  Charges:    $Therapeutic Activity: 23-37 mins PT General Charges $$ ACUTE PT VISIT: 1 Visit                     Van Clines, PT  Acute Rehabilitation Services Office [phone] Secure Chat welcomed    [name] 11/10/2023, 4:24  PM

## 2023-11-10 NOTE — Progress Notes (Signed)
While assisting patient on bedpan, patient requested I throw the remainder of his [employer] dinner away including multiple sauces, two chicken nuggets and a cup of soda. I did as patient requested and requested he use his call light when he wanted to be taken off the bedpan. When patient requested this, I entered the room to assist him at which time he stated, "That woman came in here and threw my [employer] away when I didn't want her to." I informed him I was the one who threw it away at  his request at which time he said, "Oh, you're not [place]," to which I replied, "I am not." I then assisted him off the bedpan and left him with all personal items within reach.

## 2023-11-10 NOTE — Progress Notes (Signed)
[facility]                                                                              [name], is a 43 y.o. male, DOB - Mar 29, 1981, WGN:[id number] Admit date - 09/09/2023    Outpatient Primary MD for the patient is [name], NP  LOS - 62  days  No chief complaint on file.      Brief summary   Patient is a 43 y.o. 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  While at [facility] he underwent cardiac cath showing non obstructive disease, craniotomy with abscess drainage on 09/01/23,. He had subarachnoid hemorrhage while on IV heparin, right third toe distal necrosis,was seen by podiatry, but family wanted conservative management. Since his tertiary care needs have been completed, he was transferred back to [facility] for further management.   PTOT  recommending SNF and awaiting placement.    Assessment & Plan    MRSA BACTEREMIA WITH TV/MV endocarditis - resolved - Positive MRSA blood cultures. Diagnosed on TEE, managed on Vancomycin. -Underwent left heart catheterization at  [facility] which was significant for nonobstructive disease.  -ID recommended to continue IV vancomycin, patient has completed 8-week treatment course, completed on 10/27/2023   Prostate fluid collection/abscess-resolved -Seen by [facility] urology, per their assessment, fluid collection was secondary to dilated seminal vesicle with no surgical intervention recommended.  -Infectious disease with continued concern for possible abscess, received IV vancomycin with recommendation for outpatient urology follow-up ?  [facility].   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  -Patient was evaluated by neurosurgery at [facility], status post craniotomy on 09/01/23 for abscess drainage.   -Repeat brain MRI 10/17/2023 showed improvement   ESRD on HD, TTS -Nephrology following   Hyperkalemia -Improved with HD.   Anemia of chronic kidney disease -H&H stable   CKD mineral bone disease -Continue calcitriol   Acute CVA -MRI confirms large right MCA branch infarct. - MRA head (11/25) significant for large necrotic appearing lesion within the right frontal lobe within area of the prior right MCA territory infarct with vasogenic edema and 8 mm leftward midline shift. - LDL of 34. Hemoglobin A1C of 8.9%. -2D echo 10/13 showed LVEF of 25-30% with concern for AV/MV abnormality -Transesophageal Echocardiogram (10/16 & 10/22) significant for MV/TV vegetations.  -Neurology recommendations for Eliquis.  -PT/OT recommendations for SNF.   -Patient with significant left upper extremity paresis. -Repeat MRI brain on 1/19 showed decreased area of abnormal diffusion restriction within the posterior right frontal operculum compatible with reported abscess, amount of surrounding edema has also decreased.   Primary hypertension -Patient is on metoprolol as an outpatient. Metoprolol held secondary to hypotension with dialysis.   Acute left upper extremity DVT -Continue Eliquis   Right third toe necrosis -  Per documentation, patient/family opting for conservative management and decline surgical management.  - Dr Sharlene Dory discussed with patient's mother on 2/11 who brought up the idea of podiatry, however if no surgical intervention is going to be pursued at this time, likely may not be of benefit. -Does not appear to be acutely infected, continue conservative management at this time.  If infected or necrosis spreads, will consult podiatry or orthopedic surgery   Diabetes mellitus type 1 - Poorly  controlled with hyperglycemia and hypoglycemia, likely due to poor dietary choices.  - Hemoglobin A1C of 8.9%.  CBG (last 3)  Recent Labs    11/09/23 2059 11/10/23 0748 11/10/23 1129  GLUCAP 162* 85 108*  -Continue Semglee 4 units daily, SSI   Gastroparesis -No acute issues.  Cont glycemic control   History of left AKA -Noted. Patient seen by PT/OT with recommendation for SNF on discharge.   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 Patient started on Seroquel for behavioral change management.  Appears to be improving.  Patient very conversant, A&O x 4.   Pressure injury -Left buttock, not present on admission  -Wound care per nursing    Obesity class II Estimated body mass index is 31.08 kg/m as calculated from the following:   Height as of this encounter: 5\' 7"  (1.702 m).   Weight as of this encounter: 90 kg.  Code Status: full  DVT Prophylaxis:   apixaban (ELIQUIS) tablet 5 mg   Level of Care: Level of care: Telemetry Medical Family Communication: Updated patient Disposition Plan:      Remains inpatient appropriate: Currently awaiting placement, will need outpatient HD center as well  Consultants:   ID Nephrology   Antimicrobials:   Anti-infectives (From admission, onward)    Start     Dose/Rate Route Frequency Ordered Stop   10/21/23 1230  vancomycin (VANCOCIN) 750 mg in sodium chloride 0.9 % 250 mL IVPB  Status:  Discontinued        750 mg 265 mL/hr over 60 Minutes Intravenous Every T-Th-Sa (Hemodialysis) 10/21/23 0804 10/21/23 0811   10/21/23 1200  vancomycin (VANCOCIN) 750 mg in sodium chloride 0.9 % 250 mL IVPB        750 mg 250 mL/hr over 60 Minutes Intravenous Every T-Th-Sa (Hemodialysis) 10/21/23 0811 10/27/23 1413   10/16/23 1345  vancomycin (VANCOCIN) 750 mg in sodium chloride 0.9 % 250 mL IVPB  Status:  Discontinued        750 mg 265 mL/hr over 60 Minutes Intravenous Every T-Th-Sa (Hemodialysis) 10/16/23 1257 10/21/23 0804   10/16/23  1200  vancomycin (VANCOREADY) IVPB 750 mg/150 mL  Status:  Discontinued        750 mg 150 mL/hr over 60 Minutes Intravenous Every T-Th-Sa (Hemodialysis) 10/14/23 1843 10/16/23 1257   10/14/23 1845  vancomycin (VANCOREADY) IVPB 500 mg/100 mL        500 mg 100 mL/hr over 60 Minutes Intravenous  Once 10/14/23 1843 10/15/23 0806   10/14/23 1200  vancomycin (VANCOREADY) IVPB 500 mg/100 mL  Status:  Discontinued        500 mg 100 mL/hr over 60 Minutes Intravenous Every T-Th-Sa (Hemodialysis) 10/13/23 1309 10/14/23 1843   10/13/23 1200  vancomycin (VANCOREADY) IVPB 500 mg/100 mL  Status:  Discontinued        500 mg 100 mL/hr over 60 Minutes Intravenous Every M-W-F (Hemodialysis) 10/11/23 1517 10/13/23 1309   10/12/23 1200  vancomycin (VANCOREADY) IVPB 500 mg/100 mL  Status:  Discontinued  500 mg 100 mL/hr over 60 Minutes Intravenous Every T-Th-Sa (Hemodialysis) 10/11/23 1514 10/11/23 1517   09/24/23 1200  vancomycin (VANCOREADY) IVPB 500 mg/100 mL  Status:  Discontinued        500 mg 100 mL/hr over 60 Minutes Intravenous Every M-W-F (Hemodialysis) 09/21/23 1246 10/11/23 1514   09/20/23 1200  vancomycin (VANCOCIN) 750 mg in sodium chloride 0.9 % 250 mL IVPB  Status:  Discontinued        750 mg 265 mL/hr over 60 Minutes Intravenous Every M-W-F (Hemodialysis) 09/20/23 0446 09/21/23 1246   09/20/23 1045  vancomycin (VANCOCIN) 750 mg in sodium chloride 0.9 % 250 mL IVPB        750 mg 265 mL/hr over 60 Minutes Intravenous  Once 09/20/23 0959 09/20/23 1226   09/20/23 0945  vancomycin (VANCOREADY) IVPB 750 mg/150 mL  Status:  Discontinued        750 mg 150 mL/hr over 60 Minutes Intravenous  Once 09/20/23 0855 09/20/23 0959   09/10/23 1200  vancomycin (VANCOREADY) IVPB 750 mg/150 mL  Status:  Discontinued        750 mg 150 mL/hr over 60 Minutes Intravenous Every M-W-F (Hemodialysis) 09/09/23 2030 09/20/23 0446   09/09/23 2200  linezolid (ZYVOX) tablet 600 mg  Status:  Discontinued        600 mg  Oral Every 12 hours 09/09/23 2035 09/10/23 0946          Medications  (feeding supplement) PROSource Plus  30 mL Oral BID BM   apixaban  5 mg Oral BID   bacitracin   Topical Daily   Chlorhexidine Gluconate Cloth  6 each Topical Q0600   darbepoetin (ARANESP) injection - DIALYSIS  150 mcg Subcutaneous Q Sun-1800   gabapentin  100 mg Oral TID   Gerhardt's butt cream   Topical BID   insulin aspart  0-6 Units Subcutaneous TID AC & HS   insulin glargine-yfgn  4 Units Subcutaneous Daily   loratadine  10 mg Oral Daily   midodrine  10 mg Oral Q T,Th,Sa-HD   pantoprazole  40 mg Oral Daily   povidone-Iodine   Topical Q0600   QUEtiapine  12.5 mg Oral q morning   QUEtiapine  25 mg Oral QHS   senna-docusate  2 tablet Oral BID   sevelamer carbonate  2,400 mg Oral TID WC   sodium zirconium cyclosilicate  10 g Oral TID      Subjective:   Clemons Salvucci was seen and examined today.  No acute complaints  Patient denies dizziness, chest pain, shortness of breath, abdominal pain, N/V/D/C. No acute events overnight.    Objective:   Vitals:   11/09/23 1930 11/09/23 2008 11/09/23 2109 11/10/23 0822  BP: (!) 99/56  (!) 101/52 (!) 136/92  Pulse: (!) 115  100 (!) 113  Resp: 14  18 18   Temp: 98 F (36.7 C)  97.6 F (36.4 C) 98.3 F (36.8 C)  TempSrc: Oral  Oral   SpO2: 100%  100% 100%  Weight:  90 kg    Height:        Intake/Output Summary (Last 24 hours) at 11/10/2023 1148 Last data filed at 11/09/2023 2100 Gross per 24 hour  Intake 720 ml  Output 3600 ml  Net -2880 ml     Wt Readings from Last 3 Encounters:  11/09/23 90 kg  08/24/23 78 kg  04/14/23 (S) 77.6 kg     Exam General: Alert and oriented x 3, NAD Cardiovascular: S1 S2 auscultated,  RRR Respiratory: Clear to auscultation bilaterally, no wheezing Gastrointestinal: Soft, nontender, nondistended, + bowel sounds Ext: Left AKA  Neuro: LUE flaccid, no new FNDs  Skin: mild anasarca, right 3rd toe dry gangrene   Psych:  Normal affect     Data Reviewed:  I have personally reviewed following labs    CBC Lab Results  Component Value Date   WBC 4.0 11/10/2023   RBC 3.68 (L) 11/10/2023   HGB 10.1 (L) 11/10/2023   HCT 34.4 (L) 11/10/2023   MCV 93.5 11/10/2023   MCH 27.4 11/10/2023   PLT  11/10/2023    PLATELET CLUMPS NOTED ON SMEAR, COUNT APPEARS DECREASED   MCHC 29.4 (L) 11/10/2023   RDW 16.5 (H) 11/10/2023   LYMPHSABS 1.0 11/10/2023   MONOABS 0.3 11/10/2023   EOSABS 0.3 11/10/2023   BASOSABS 0.0 11/10/2023     Last metabolic panel Lab Results  Component Value Date   NA 136 11/10/2023   K 4.5 11/10/2023   CL 92 (L) 11/10/2023   CO2 27 11/10/2023   BUN 31 (H) 11/10/2023   CREATININE 5.34 (H) 11/10/2023   GLUCOSE 91 11/10/2023   GFRNONAA 13 (L) 11/10/2023   GFRAA 7 (L) 05/30/2020   CALCIUM 9.6 11/10/2023   PHOS 6.2 (H) 11/10/2023   PROT 7.7 10/19/2023   PROT 7.8 10/19/2023   ALBUMIN 3.6 11/10/2023   BILITOT 1.3 (H) 10/19/2023   BILITOT 1.3 (H) 10/19/2023   ALKPHOS 150 (H) 10/19/2023   ALKPHOS 150 (H) 10/19/2023   AST 20 10/19/2023   AST 19 10/19/2023   ALT 10 10/19/2023   ALT 8 10/19/2023   ANIONGAP 17 (H) 11/10/2023    CBG (last 3)  Recent Labs    11/09/23 2059 11/10/23 0748 11/10/23 1129  GLUCAP 162* 85 108*      Coagulation Profile: No results for input(s): "INR", "PROTIME" in the last 168 hours.   Radiology Studies: I have personally reviewed the imaging studies  No results found.     Thad Ranger M.D. [facility] 11/10/2023, 11:48 AM  Available via Epic secure chat 7am-7pm After 7 pm, please refer to night coverage provider listed on amion.

## 2023-11-10 NOTE — Progress Notes (Signed)
[facility] Progress Note   Subjective:    Seen and examined patient at bedside. He expresses frustration that he hasn;t been accepted to a SNF yet. Tolerated yesterday's HD with net UF 3.6L. Next HD 2/13.  Objective Vitals:   11/09/23 1930 11/09/23 2008 11/09/23 2109 11/10/23 0822  BP: (!) 99/56  (!) 101/52 (!) 136/92  Pulse: (!) 115  100 (!) 113  Resp: 14  18 18   Temp: 98 F (36.7 C)  97.6 F (36.4 C) 98.3 F (36.8 C)  TempSrc: Oral  Oral   SpO2: 100%  100% 100%  Weight:  90 kg    Height:       Physical Exam General: Alert, chronically ill male NAD Heart: RRR no MRG Lungs: CTA bilaterally RA, unlabored Abdomen: NABS, soft NT ND, trace abdominal edema Extremities: Left AKA trace stump edema, RLE trace edema Dialysis Access: L FEM TDC dressing dry clear  Filed Weights   11/06/23 1424 11/06/23 1734 11/09/23 2008  Weight: 97 kg 93.3 kg 90 kg    Intake/Output Summary (Last 24 hours) at 11/10/2023 1345 Last data filed at 11/09/2023 2100 Gross per 24 hour  Intake 360 ml  Output 3600 ml  Net -3240 ml    Additional Objective Labs: Basic Metabolic Panel: Recent Labs  Lab 11/06/23 0509 11/09/23 1826 11/10/23 0749  NA 139 137 136  K 4.2 4.6 4.5  CL 93* 94* 92*  CO2 26 27 27   GLUCOSE 120* 213* 91  BUN 31* 44* 31*  CREATININE 5.59* 7.16* 5.34*  CALCIUM 9.9 9.1 9.6  PHOS 6.3* 7.7* 6.2*   Liver Function Tests: Recent Labs  Lab 11/06/23 0509 11/09/23 1826 11/10/23 0749  ALBUMIN 3.4* 3.4* 3.6   No results for input(s): "LIPASE", "AMYLASE" in the last 168 hours. CBC: Recent Labs  Lab 11/04/23 1336 11/06/23 0509 11/09/23 1826 11/10/23 0749  WBC 4.0 3.8* 4.7 4.0  NEUTROABS 2.6 2.2 3.0 2.3  HGB 8.3* 9.1* 8.8* 10.1*  HCT 29.4* 30.9* 30.9* 34.4*  MCV 94.8 92.8 95.4 93.5  PLT 126* 141* 132* PLATELET CLUMPS NOTED ON SMEAR, COUNT APPEARS DECREASED   Blood Culture    Component Value Date/Time   SDES BLOOD SITE NOT SPECIFIED 08/25/2023 1706    SPECREQUEST  08/25/2023 1706    BOTTLES DRAWN AEROBIC ONLY Blood Culture results may not be optimal due to an inadequate volume of blood received in culture bottles   CULT  08/25/2023 1706    NO GROWTH 5 DAYS Performed at [facility], [address], [place], [name] [address]    REPTSTATUS 08/30/2023 FINAL 08/25/2023 1706    Cardiac Enzymes: No results for input(s): "CKTOTAL", "CKMB", "CKMBINDEX", "TROPONINI" in the last 168 hours. CBG: Recent Labs  Lab 11/09/23 0735 11/09/23 1143 11/09/23 2059 11/10/23 0748 11/10/23 1129  GLUCAP 100* 184* 162* 85 108*   Iron Studies: No results for input(s): "IRON", "TIBC", "TRANSFERRIN", "FERRITIN" in the last 72 hours. Lab Results  Component Value Date   INR 1.2 07/22/2020   INR 1.0 05/30/2020   INR 1.2 04/24/2019   Studies/Results: No results found.  Medications:  iron sucrose Stopped (11/09/23 2008)    (feeding supplement) PROSource Plus  30 mL Oral BID BM   apixaban  5 mg Oral BID   bacitracin   Topical Daily   Chlorhexidine Gluconate Cloth  6 each Topical Q0600   darbepoetin (ARANESP) injection - DIALYSIS  150 mcg Subcutaneous Q Sun-1800   gabapentin  100 mg Oral TID  Gerhardt's butt cream   Topical BID   insulin aspart  0-6 Units Subcutaneous TID AC & HS   insulin glargine-yfgn  4 Units Subcutaneous Daily   loratadine  10 mg Oral Daily   midodrine  10 mg Oral Q T,Th,Sa-HD   pantoprazole  40 mg Oral Daily   povidone-Iodine   Topical Q0600   QUEtiapine  12.5 mg Oral q morning   QUEtiapine  25 mg Oral QHS   senna-docusate  2 tablet Oral BID   sevelamer carbonate  2,400 mg Oral TID WC   sodium zirconium cyclosilicate  10 g Oral TID    Dialysis Orders: MWF - [facility] -> may be will be going to [place] TTS on discharge 4hr, 400/A1.5, EDW 77kg, L femoral TDC, 2K/3Ca bath, no heparin  Assessment/Plan: MRSA bacteremia with MV/TV endocarditis + brain abscess:  Not a candidate for valve surgery, S/p line holiday and brain  abscess drainage (at [facility]). Completed on extensive IV Vanc course on 1/29. S/p transfer to [facility] and drainage 08/31/24. Repeat brain MRI 10/17/23 showed improvement. Acute B CVA/septic emboli- due to above Acute LUE DVT: On Eliquis ESRD: Transitioned to TTS. Next 2/13 HTN/volume: Didn't reach max UF 2/4 2nd low Bps from pain medication and signing off early. CT 10/09/23 with diffuse anasarca. Significantly above EDW but using bed weights. Max UF with HD. On Midodrine with HD and okay to use IV Albumin and extra Midodrine dose PRN. Enforcing fluid restrictions/compliance with full HD time.  Anemia of ESRD: Hgbs 8-9s. Raised Aranesp to . Iron studies from today include: Iron 31, Tsat 15%, and Ferritin 140. On Fe load X 10 doses with HD-1st dose 2/8. Checking labs in AM. Secondary HPTH:  CCA ^, Continue to hold calcitriol for now. Sevelamer  ha been ^ to 3/meals.  Continues to have outside food and refuses any diet except regular (chronic problem with him despite counseling).phos 6.3 . Hyperkalemia: K+ 4.2, now stable. Continue Lokelma 10g TID for now, using low bath intermittently with HD.  As noted above diet indiscretion despite counseling. Checking labs in AM. Prostate fluid collection/abscess = noted per admit team urology consulted at outside hospital per their assessment "fluid collection was 2/2 dilated seminal vesicle with no surg.  Intervention recommended/ID concern for possible abscess patient covered with Vanc. and recommendation for outpatient urology follow-up" Nutrition: Alb 3.4,  continue supplements.  T1DM: per primary Dispo: SNF being arranged - not finalized yet  Salome Holmes, NP [facility] 11/10/2023,1:45 PM  LOS: 62 days

## 2023-11-10 NOTE — Progress Notes (Signed)
Occupational Therapy Treatment Patient Details Name: [name] MRN: [id number] DOB: 07/24/1981 Today's Date: 11/10/2023   History of present illness 43 y.o. male presents to [facility] 09/09/23 as a transfer from [facility] for further management of endocarditis. Pt originally was admitted to [facility] 07/09/23 for DKA and sepsis after being found on the ground. 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 10/17 pt had a R MCA, L occipital, and B cerebellum CVA w/ septic emboli. ICU admit 10/18 w/ intubation 10/18-10/24. Pt was transferred to [facility] for MV repair on 11/29. At [facility], pt had cardiac cath w/ non obstructive disease, SAH, a craniotomy w/ abscess drainage on 12/4, and R third toe distal necrosis. PMH: chronic L AKA, MRSA, IDDM, ESRD on HD MWF, HTN, TEE positive for vegetation on multiple valves, cellulitis, medical noncompliance, narcotic dependence, DM with gastroparesis, L internal jugular DVT.   OT comments  Pt with PT, Mobility Specialist, and RN present upon OT arrival with pt agitated and perseverating on going to HD late yesterday, which affected his meal schedule. With active listening and increased time, pt calmed and was able to actively participate in session with OT dovetailing with PT session. OT session focused on training in techniques for increased safety and independence with ADLs, bed mobility during/in preparation for ADLs, and B UE therapeutic exercise to increase strength in R UE and increased ROM, maintain joint integrity, and address edematous L UE. Pt currently demonstrates ability to complete ADLs largely with Set up to Mod assist from bed level and bed mobility with Mod to Max assist of +1 to +2, all with cues for compensatory strategies and sequencing. Pt with overall low motivation to address dressing tasks and pt prefers to not wear clothes; however, pt is motivated to be able to independently donn/doff R post-op shoe and education in this  was initiated this session. Pt also reports wanting to be able to feed himself without spilling food and with improved ability to locate food items on his Left side with OT educating pt on ability to address in OT sessions and pt agreeable to addressing with OT. Goals updated this day based on pt progress, current functional level, and pt goals and preferences. Pt continues to be limited by decreased cognition and poor insight into benefits of/role of skilled rehab services in pt ability to meet his goals. However, pt is motivated to reach his goals and continues to make slow but consistent progress toward OT goals. Pt will benefit from continued skilled OT services to address deficits outlined below and increase safety and independence with functional tasks. Post acute discharge, pt will benefit from intensive inpatient skilled rehab services < 3 hours per day to maximize rehab potential.        If plan is discharge home, recommend the following:  Two people to help with walking and/or transfers;A lot of help with bathing/dressing/bathroom;Assistance with cooking/housework;Direct supervision/assist for medications management;Direct supervision/assist for financial management;Assist for transportation;Help with stairs or ramp for entrance;Supervision due to cognitive status   Equipment Recommendations  Other (comment) (defer to next level of care)    Recommendations for Other Services      Precautions / Restrictions Precautions Precautions: Fall Precaution/Restrictions Comments: L AKA, L hemiparesis and edema, L neglect, bowel incontinence. L femoral HD cath Restrictions Weight Bearing Restrictions Per Provider Order: No LLE Weight Bearing Per Provider Order: Non weight bearing Other Position/Activity Restrictions: Pt has sling for LUE when mobilizing OOB; LLE prosthetic not fitting  Mobility Bed Mobility Overal bed mobility: Needs Assistance Bed Mobility: Rolling, Supine to Sit, Sit to  Supine Rolling: Mod assist, Max assist   Supine to sit: Max assist, +2 for physical assistance          Transfers                   General transfer comment: Pt declined addressing transfers this session     Balance Overall balance assessment: Needs assistance Sitting-balance support: Single extremity supported, Feet supported (R LE supported) Sitting balance-[name]: Fair                                     ADL either performed or assessed with clinical judgement   ADL Overall ADL's : Needs assistance/impaired Eating/Feeding: Set up;Bed level   Grooming: Set up;Bed level;Minimal assistance;Sitting;Cueing for compensatory techniques;Cueing for sequencing;Wash/dry hands;Wash/dry face Grooming Details (indicate cue type and reason): Set up from bed level; Min assist sitting EOB             Lower Body Dressing: Supervision/safety;Contact guard assist;Cueing for sequencing;Bed level Lower Body Dressing Details (indicate cue type and reason): CGA to doff and Supervision with cues to Doff R post-op shoe from bed level               General ADL Comments: Pt frequently declining addressing ADLs during OT sessions due to pt with continued poor insight into role of OT and due to pt preference to not wear clothing. However, pt participates well when he is motivated to perform a particular task.    Extremity/Trunk Assessment Upper Extremity Assessment Upper Extremity Assessment: Right hand dominant;LUE deficits/detail LUE Deficits / Details: edematous L UE from shoulder to hand; pt hand with clawing position at rest; hemiparesis LUE Sensation: decreased light touch;decreased proprioception LUE Coordination: decreased fine motor;decreased gross motor   Lower Extremity Assessment Lower Extremity Assessment: Defer to PT evaluation        Vision       Perception     Praxis     Communication Communication Communication: Impaired Factors  Affecting Communication: Other (comment) (Requires increased time for processing. Benefits from use of short, simple sentences. Often perseverates on topics.)   Cognition Arousal: Alert Behavior During Therapy: Agitated, WFL for tasks assessed/performed (Initially agitated with pt perseverating on frustration with being taken to HD late yesterday and it interfering with his meal schedule. With active listening and increased time, pt calmed and was able to more actively participate in session.) Cognition: Cognition impaired     Awareness: Intellectual awareness intact, Online awareness impaired Memory impairment (select all impairments): Working [employer] fast streamer, Short-term memory Attention impairment (select first level of impairment): Selective attention Executive functioning impairment (select all impairments): Initiation, Organization, Sequencing, Reasoning, Problem solving                   Following commands: Impaired (Pt currently demonstrating ability to follow 2-step instructions appropriately in 6/10 attempts with destractions minimized in room (ex. only one staff present, TV sound off, etc.). Pt also with improved instruction following during preferred tasks.) Following commands impaired: Only follows one step commands consistently, Follows one step commands with increased time, Follows multi-step commands inconsistently      Cueing   Cueing Techniques: Verbal cues, Tactile cues  Exercises Exercises: General Upper Extremity General Exercises - Upper Extremity Shoulder Flexion: PROM, Left, 10 reps, Supine, Strengthening, Right, Theraband (with  HOB elevated; to decreased edema and maintain joint integrity on Left) Theraband Level (Shoulder Flexion): Level 2 (Red) Shoulder Extension: PROM, Left, 10 reps, Supine (with HOB elevated; to decreased edema and maintain joint integrity on Left) Shoulder ABduction: PROM, 5 reps, Supine (with HOB elevated; to decreased edema and maintain joint  integrity) Shoulder ADduction: PROM, Left, Supine, 5 reps (with HOB elevated; to decreased edema and maintain joint integrity) Elbow Flexion: PROM, Self ROM, Left, 10 reps, Supine, Strengthening, Right, Theraband (with HOB elevated; to decreased edema and maintain joint integrity on Left) Theraband Level (Elbow Flexion): Level 2 (Red) Elbow Extension: PROM, Self ROM, Left, Supine, Strengthening, 10 reps, Theraband (with HOB elevated; to decreased edema and maintain joint integrity on Left) Theraband Level (Elbow Extension): Level 2 (Red) Wrist Flexion: PROM, Self ROM, Left, Supine, 10 reps (with HOB elevated; to decreased edema and maintain joint integrity on Left) Wrist Extension: PROM, Self ROM, Left, 10 reps, Supine (with HOB elevated; to decreased edema and maintain joint integrity on Left)    Shoulder Instructions       General Comments PT, Mobility Specialist, and RN each present during a portion of session. OT reeducated pt in role of skilled OT services and how active participation in OT/PT sessions can help pt reach his goals. Pt verbalized understanding of training and will benefit from reinforcement of all education.    Pertinent Vitals/ Pain       Pain Assessment Pain Assessment: Faces Faces Pain Scale: Hurts a little bit (with bed mobility; sitting EOB) Pain Location: buttock and wounds Pain Descriptors / Indicators: Sore, Discomfort, Grimacing Pain Intervention(s): Monitored during session, Limited activity within patient's tolerance, Repositioned  Home Living                                          Prior Functioning/Environment              Frequency  Min 1X/week        Progress Toward Goals  OT Goals(current goals can now be found in the care plan section)  Progress towards OT goals: Progressing toward goals;Goals updated  Acute Rehab OT Goals Patient Stated Goal: to be able to eat without dropping food on himself, to be more  independent, to spend more time out of the bed, and to be able to leave the hospital OT Goal Formulation: With patient Time For Goal Achievement: 11/24/23 Potential to Achieve Goals: Good ADL Goals Pt Will Perform Eating: with modified independence;sitting (with no spillage of food and increased attention to Left side of tray and body during meal; with adaptive equipment as needed) Pt Will Perform Grooming: with modified independence;sitting (chair level) Pt/caregiver will Perform Home Exercise Program: Left upper extremity;Increased ROM;Right Upper extremity;Increased strength;With Supervision (Left UE self-ROM and PROM to maintain joint integrity, address edematous L UE, and increase ROM; Right UE with Red theraband to increased strength and activity tolerance for carryover to functional tasks) Additional ADL Goal #1: Patient will demonstrate ability to follow 2-step instructions appropriately in 8/10 attempts. Additional ADL Goal #2: pt will demonstrate bed Rolling transfer with hooking R UE 100% of attempts Additional ADL Goal #3: Patient will demonstrate ability to sit EOB with Fair balance for 3 or more minutes during a functional or therapeutic task. Additional ADL Goal #4: Patient will demonstrate ability to doff/donn R post-op shoe with Supervision.  Plan  Co-evaluation    PT/OT/SLP Co-Evaluation/Treatment: Yes (dovetail)            AM-PAC OT "6 Clicks" Daily Activity     Outcome Measure   Help from another person eating meals?: A Little Help from another person taking care of personal grooming?: A Little Help from another person toileting, which includes using toliet, bedpan, or urinal?: A Lot Help from another person bathing (including washing, rinsing, drying)?: A Lot Help from another person to put on and taking off regular upper body clothing?: A Little Help from another person to put on and taking off regular lower body clothing?: A Lot 6 Click Score: 15    End  of Session    OT Visit Diagnosis: Other symptoms and signs involving cognitive function;Hemiplegia and hemiparesis Hemiplegia - Right/Left: Left Hemiplegia - dominant/non-dominant: Non-Dominant Hemiplegia - caused by: Cerebral infarction   Activity Tolerance Patient tolerated treatment well;Treatment limited secondary to agitation   Patient Left in bed;with call bell/phone within reach;with bed alarm set   Nurse Communication Mobility status        Time: 2956-2130 OT Time Calculation (min): 41 min  Charges: OT General Charges $OT Visit: 1 Visit OT Treatments $Self Care/Home Management : 8-22 mins $Therapeutic Activity: 8-22 mins $Therapeutic Exercise: 8-22 mins  Celena Lanius "Orson Eva., OTR/L, MA Acute Rehab [phone]   Lendon Colonel 11/10/2023, 5:24 PM

## 2023-11-10 NOTE — TOC Progression Note (Signed)
Transition of Care [facility]) - Progression Note    Patient Details  Name: [name] MRN: [id number] Date of Birth: November 22, 1980  [facility] of [facility] CM/SW Contact  Delilah Shan, LCSWA Phone Number: 11/10/2023, 5:15 PM  Clinical Narrative:     No current SNF bed offers. CSW will continue to follow.  Expected Discharge Plan: Skilled Nursing Facility Barriers to Discharge: Continued Medical Work up  Expected Discharge Plan and Services                                               Social Determinants of Health (SDOH) Interventions SDOH Screenings   Food Insecurity: No Food Insecurity (09/09/2023)  Housing: Unknown (09/09/2023)  Transportation Needs: No Transportation Needs (09/09/2023)  Utilities: Not At Risk (09/09/2023)  Alcohol Screen: Low Risk  (02/15/2023)  Depression (PHQ2-9): Low Risk  (02/15/2023)  Financial Resource Strain: Low Risk  (02/15/2023)  Physical Activity: Inactive (02/15/2023)  Social Connections: Socially Integrated (02/15/2023)  Stress: No Stress Concern Present (02/15/2023)  Tobacco Use: Low Risk  (09/12/2023)  Recent Concern: Tobacco Use - Medium Risk (09/01/2023)   Received from [facility]    Readmission Risk Interventions     No data to display

## 2023-11-10 NOTE — Progress Notes (Signed)
Mobility Specialist Progress Note:    11/10/23 1336  Pain Assessment  Pain Assessment Faces  Faces Pain Scale 2 (with bed mobility; sitting EOB)  Pain Location buttock and wounds  Pain Descriptors / Indicators Sore;Discomfort;Grimacing  Pain Intervention(s) Limited activity within patient's tolerance;Monitored during session;Repositioned  Mobility  Activity Moved into chair position in bed;Turned to back - supine  Level of Assistance Minimal assist, patient does 75% or more  Assistive Device Other (Comment) (Theraband, bedrails, & HHA)  Range of Motion/Exercises Active;Active Assistive;Passive;Right arm;Left arm  LLE Weight Bearing Per Provider Order NWB  Activity Response Tolerated well  Mobility Referral Yes  Mobility visit 1 Mobility  Mobility Specialist Start Time (ACUTE ONLY) 1154  Mobility Specialist Stop Time (ACUTE ONLY) 1246  Mobility Specialist Time Calculation (min) (ACUTE ONLY) 52 min   Pt received in bed and agreeable. Able to transition into chair position in bed and practice BUE exercises w/ emphasis on passively moving LUE. No complaints throughout. Pt left in bed w/ RN and OT present.  D'Vante Earlene Plater Mobility Specialist Please contact via Special educational needs teacher or Rehab office at [phone]

## 2023-11-11 LAB — GLUCOSE, CAPILLARY
Glucose-Capillary: 248 mg/dL — ABNORMAL HIGH (ref 70–99)
Glucose-Capillary: 190 mg/dL — ABNORMAL HIGH (ref 70–99)
Glucose-Capillary: 267 mg/dL — ABNORMAL HIGH (ref 70–99)
Glucose-Capillary: 329 mg/dL — ABNORMAL HIGH (ref 70–99)

## 2023-11-11 MED ORDER — INSULIN GLARGINE-YFGN 100 UNIT/ML ~~LOC~~ SOLN
6.0000 [IU] | Freq: Every day | SUBCUTANEOUS | Status: DC
  Administered 2023-11-12: 6 [IU] via SUBCUTANEOUS
  Filled 2023-11-11: qty 0.06

## 2023-11-11 NOTE — Plan of Care (Signed)
Patient remains stable.  Working on transfer to [facility], possibly [facility].  Will take dialysis later today.  Patient compliant with meds and care today.

## 2023-11-11 NOTE — Progress Notes (Signed)
[facility]                                                                              [name], is a 43 y.o. male, DOB - January 14, 1981, WUJ:[id number] Admit date - 09/09/2023    Outpatient Primary MD for the patient is [name], NP  LOS - 63  days  No chief complaint on file.      Brief summary   Patient is a 43 y.o. 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  While at [facility] he underwent cardiac cath showing non obstructive disease, craniotomy with abscess drainage on 09/01/23,. He had subarachnoid hemorrhage while on IV heparin, right third toe distal necrosis,was seen by podiatry, but family wanted conservative management. Since his tertiary care needs have been completed, he was transferred back to [facility] for further management.   PTOT  recommending SNF and awaiting placement.    Assessment & Plan    MRSA BACTEREMIA WITH TV/MV endocarditis - resolved - Positive MRSA blood cultures. Diagnosed on TEE, managed on Vancomycin. -Underwent left heart catheterization at  [facility] which was significant for nonobstructive disease.  -ID recommended to continue IV vancomycin, patient has completed 8-week treatment course, completed on 10/27/2023   Prostate fluid collection/abscess-resolved -Seen by [facility] urology, per their assessment, fluid collection was secondary to dilated seminal vesicle with no surgical intervention recommended.  -Infectious disease with continued concern for possible abscess, received IV vancomycin with recommendation for outpatient urology follow-up ?  [facility].   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  -Patient was evaluated by neurosurgery at [facility], status post craniotomy on 09/01/23 for abscess drainage.   -Repeat brain MRI 10/17/2023 showed improvement   ESRD on HD, TTS -Nephrology following   Hyperkalemia -Improved with HD.   Anemia of chronic kidney disease -H&H stable   CKD mineral bone disease -Continue calcitriol   Acute CVA -MRI confirms large right MCA branch infarct. - MRA head (11/25) significant for large necrotic appearing lesion within the right frontal lobe within area of the prior right MCA territory infarct with vasogenic edema and 8 mm leftward midline shift. - LDL of 34. Hemoglobin A1C of 8.9%. -2D echo 10/13 showed LVEF of 25-30% with concern for AV/MV abnormality -Transesophageal Echocardiogram (10/16 & 10/22) significant for MV/TV vegetations.  -Neurology recommendations for Eliquis.  -PT/OT recommendations for SNF.   -Patient with significant left upper extremity paresis. -Repeat MRI brain on 1/19 showed decreased area of abnormal diffusion restriction within the posterior right frontal operculum compatible with reported abscess, amount of surrounding edema has also decreased.   Primary hypertension -Patient is on metoprolol as an outpatient. Metoprolol held secondary to hypotension with dialysis.   Acute left upper extremity DVT -Continue Eliquis   Right third toe necrosis -  Per documentation, patient/family opting for conservative management and decline surgical management.  - Dr Sharlene Dory discussed with patient's mother on 2/11 who brought up the idea of podiatry, however if no surgical intervention is going to be pursued at this time, likely may not be of benefit. -Does not appear to be acutely infected, continue conservative management at this time.  If infected or necrosis spreads, will consult podiatry or orthopedic surgery   Diabetes mellitus type 1 - Poorly  controlled with hyperglycemia and hypoglycemia, likely due to poor dietary choices.  - Hemoglobin A1C of 8.9%.  CBG (last 3)  Recent Labs    11/10/23 2047 11/11/23 0826 11/11/23 1224  GLUCAP 280* 248* 190*  -Increase Semglee to 6 units daily, SSI   Gastroparesis -No acute issues.  Cont glycemic control   History of left AKA -Noted. Patient seen by PT/OT with recommendation for SNF on discharge.   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 Patient started on Seroquel for behavioral change management.  Appears to be improving.  Patient very conversant, A&O x 4.   Pressure injury -Left buttock, not present on admission  -Wound care per nursing   Constipation -Patient had type I BM yesterday, with small amount of bright red blood from the hemorrhoids and severe constipation. He was placed on hydrocortisone suppositories, MiraLAX daily, Colace twice daily -Patient however refuses the stool softeners, laxatives or suppositories.  Explained to the patient regarding constipation with narcotics and that if he starts having GI bleed, will have to hold anticoagulation which poses him to have a higher risk for CVA or PE/DVT.  Patient understood the above however still refused the suppository and stool softeners.   Obesity class II Estimated body mass index is 31.08 kg/m as calculated from the following:   Height as of this encounter: 5\' 7"  (1.702 m).   Weight as of this encounter: 90 kg.  Code Status: full  DVT Prophylaxis:   apixaban (ELIQUIS) tablet 5 mg   Level of Care: Level of care: Telemetry Medical Family Communication: Updated patient Disposition Plan:      Remains inpatient appropriate: Currently awaiting placement, will need outpatient HD center as well  Consultants:   ID Nephrology   Antimicrobials:   Anti-infectives (From admission, onward)    Start     Dose/Rate Route Frequency Ordered Stop   10/21/23 1230  vancomycin (VANCOCIN) 750 mg in sodium chloride  0.9 % 250 mL IVPB  Status:  Discontinued        750 mg 265 mL/hr over 60 Minutes Intravenous Every T-Th-Sa (Hemodialysis) 10/21/23 0804 10/21/23 0811   10/21/23 1200  vancomycin (VANCOCIN) 750 mg in sodium chloride 0.9 % 250 mL IVPB        750 mg 250 mL/hr over 60 Minutes Intravenous Every T-Th-Sa (Hemodialysis) 10/21/23 0811 10/27/23 1413   10/16/23 1345  vancomycin (VANCOCIN) 750 mg in sodium chloride 0.9 % 250 mL IVPB  Status:  Discontinued        750 mg 265 mL/hr over 60 Minutes Intravenous Every T-Th-Sa (Hemodialysis) 10/16/23 1257 10/21/23 0804   10/16/23 1200  vancomycin (VANCOREADY) IVPB 750 mg/150 mL  Status:  Discontinued        750 mg 150 mL/hr over 60 Minutes Intravenous Every T-Th-Sa (Hemodialysis) 10/14/23 1843 10/16/23 1257   10/14/23 1845  vancomycin (VANCOREADY) IVPB 500 mg/100 mL        500 mg 100 mL/hr over 60 Minutes Intravenous  Once 10/14/23 1843 10/15/23 0806   10/14/23 1200  vancomycin (  VANCOREADY) IVPB 500 mg/100 mL  Status:  Discontinued        500 mg 100 mL/hr over 60 Minutes Intravenous Every T-Th-Sa (Hemodialysis) 10/13/23 1309 10/14/23 1843   10/13/23 1200  vancomycin (VANCOREADY) IVPB 500 mg/100 mL  Status:  Discontinued        500 mg 100 mL/hr over 60 Minutes Intravenous Every M-W-F (Hemodialysis) 10/11/23 1517 10/13/23 1309   10/12/23 1200  vancomycin (VANCOREADY) IVPB 500 mg/100 mL  Status:  Discontinued        500 mg 100 mL/hr over 60 Minutes Intravenous Every T-Th-Sa (Hemodialysis) 10/11/23 1514 10/11/23 1517   09/24/23 1200  vancomycin (VANCOREADY) IVPB 500 mg/100 mL  Status:  Discontinued        500 mg 100 mL/hr over 60 Minutes Intravenous Every M-W-F (Hemodialysis) 09/21/23 1246 10/11/23 1514   09/20/23 1200  vancomycin (VANCOCIN) 750 mg in sodium chloride 0.9 % 250 mL IVPB  Status:  Discontinued        750 mg 265 mL/hr over 60 Minutes Intravenous Every M-W-F (Hemodialysis) 09/20/23 0446 09/21/23 1246   09/20/23 1045  vancomycin (VANCOCIN) 750 mg  in sodium chloride 0.9 % 250 mL IVPB        750 mg 265 mL/hr over 60 Minutes Intravenous  Once 09/20/23 0959 09/20/23 1226   09/20/23 0945  vancomycin (VANCOREADY) IVPB 750 mg/150 mL  Status:  Discontinued        750 mg 150 mL/hr over 60 Minutes Intravenous  Once 09/20/23 0855 09/20/23 0959   09/10/23 1200  vancomycin (VANCOREADY) IVPB 750 mg/150 mL  Status:  Discontinued        750 mg 150 mL/hr over 60 Minutes Intravenous Every M-W-F (Hemodialysis) 09/09/23 2030 09/20/23 0446   09/09/23 2200  linezolid (ZYVOX) tablet 600 mg  Status:  Discontinued        600 mg Oral Every 12 hours 09/09/23 2035 09/10/23 0946          Medications  (feeding supplement) PROSource Plus  30 mL Oral BID BM   apixaban  5 mg Oral BID   bacitracin   Topical Daily   Chlorhexidine Gluconate Cloth  6 each Topical Q0600   darbepoetin (ARANESP) injection - DIALYSIS  150 mcg Subcutaneous Q Sun-1800   docusate sodium  100 mg Oral BID   gabapentin  100 mg Oral TID   Gerhardt's butt cream   Topical BID   hydrocortisone  25 mg Rectal BID   insulin aspart  0-6 Units Subcutaneous TID AC & HS   insulin glargine-yfgn  4 Units Subcutaneous Daily   lactulose  20 g Oral Once   loratadine  10 mg Oral Daily   midodrine  10 mg Oral Q T,Th,Sa-HD   pantoprazole  40 mg Oral Daily   polyethylene glycol  17 g Oral Daily   povidone-Iodine   Topical Q0600   QUEtiapine  12.5 mg Oral q morning   QUEtiapine  25 mg Oral QHS   senna-docusate  2 tablet Oral BID   sevelamer carbonate  2,400 mg Oral TID WC   sodium zirconium cyclosilicate  10 g Oral TID      Subjective:   [name] was seen and examined today.  No acute complaints today except refuses Anusol suppository and stool softeners. Patient denies dizziness, chest pain, shortness of breath, abdominal pain, N/vomiting.   Objective:   Vitals:   11/10/23 2048 11/11/23 0500 11/11/23 0800 11/11/23 1200  BP: (!) 144/92 (!) 135/90 (!) 127/34 130/61  Pulse: (!) 115 (!)  112 (!) 105   Resp: 18 18 18    Temp: 98.4 F (36.9 C) 98.7 F (37.1 C) (!) 97.4 F (36.3 C) 98.1 F (36.7 C)  TempSrc:   Oral Oral  SpO2: (!) 88% 95% 94%   Weight:      Height:        Intake/Output Summary (Last 24 hours) at 11/11/2023 1310 Last data filed at 11/11/2023 0900 Gross per 24 hour  Intake 360 ml  Output --  Net 360 ml     Wt Readings from Last 3 Encounters:  11/09/23 90 kg  08/24/23 78 kg  04/14/23 (S) 77.6 kg    Physical Exam General: Alert and oriented x 3, NAD Cardiovascular: S1 S2 clear, RRR.  Respiratory: CTAB, no wheezing Gastrointestinal: Soft, nontender, nondistended, NBS Ext: left AKA Neuro: LUE flaccid Skin: Anasarca, right third toe dry gangrene Psych: Normal affect      Data Reviewed:  I have personally reviewed following labs    CBC Lab Results  Component Value Date   WBC 4.0 11/10/2023   RBC 3.68 (L) 11/10/2023   HGB 10.1 (L) 11/10/2023   HCT 34.4 (L) 11/10/2023   MCV 93.5 11/10/2023   MCH 27.4 11/10/2023   PLT  11/10/2023    PLATELET CLUMPS NOTED ON SMEAR, COUNT APPEARS DECREASED   MCHC 29.4 (L) 11/10/2023   RDW 16.5 (H) 11/10/2023   LYMPHSABS 1.0 11/10/2023   MONOABS 0.3 11/10/2023   EOSABS 0.3 11/10/2023   BASOSABS 0.0 11/10/2023     Last metabolic panel Lab Results  Component Value Date   NA 136 11/10/2023   K 4.5 11/10/2023   CL 92 (L) 11/10/2023   CO2 27 11/10/2023   BUN 31 (H) 11/10/2023   CREATININE 5.34 (H) 11/10/2023   GLUCOSE 91 11/10/2023   GFRNONAA 13 (L) 11/10/2023   GFRAA 7 (L) 05/30/2020   CALCIUM 9.6 11/10/2023   PHOS 6.2 (H) 11/10/2023   PROT 7.7 10/19/2023   PROT 7.8 10/19/2023   ALBUMIN 3.6 11/10/2023   BILITOT 1.3 (H) 10/19/2023   BILITOT 1.3 (H) 10/19/2023   ALKPHOS 150 (H) 10/19/2023   ALKPHOS 150 (H) 10/19/2023   AST 20 10/19/2023   AST 19 10/19/2023   ALT 10 10/19/2023   ALT 8 10/19/2023   ANIONGAP 17 (H) 11/10/2023    CBG (last 3)  Recent Labs    11/10/23 2047  11/11/23 0826 11/11/23 1224  GLUCAP 280* 248* 190*      Coagulation Profile: No results for input(s): "INR", "PROTIME" in the last 168 hours.   Radiology Studies: I have personally reviewed the imaging studies  No results found.     Thad Ranger M.D. [facility] 11/11/2023, 1:10 PM  Available via Epic secure chat 7am-7pm After 7 pm, please refer to night coverage provider listed on amion.

## 2023-11-11 NOTE — TOC Progression Note (Signed)
Transition of Care [facility]) - Progression Note    Patient Details  Name: CASEY FYE MRN: [id number] Date of Birth: 1981-06-18  [facility]) CM/SW Contact  Tom-Johnson, Hershal Coria, RN Phone Number: 11/11/2023, 11:04 AM  Clinical Narrative:     CM consulted for LTACH. CM spoke with patient and options given to patient. Patient chose [facility]. CM called in referral to [facility] and voiced acceptance pending Insurance approval and HD bed availability.  CM will continue to follow.            Expected Discharge Plan: Skilled Nursing Facility Barriers to Discharge: Continued Medical Work up  Expected Discharge Plan and Services                                               Social Determinants of Health (SDOH) Interventions SDOH Screenings   Food Insecurity: No Food Insecurity (09/09/2023)  Housing: Unknown (09/09/2023)  Transportation Needs: No Transportation Needs (09/09/2023)  Utilities: Not At Risk (09/09/2023)  Alcohol Screen: Low Risk  (02/15/2023)  Depression (PHQ2-9): Low Risk  (02/15/2023)  Financial Resource Strain: Low Risk  (02/15/2023)  Physical Activity: Inactive (02/15/2023)  Social Connections: Socially Integrated (02/15/2023)  Stress: No Stress Concern Present (02/15/2023)  Tobacco Use: Low Risk  (09/12/2023)  Recent Concern: Tobacco Use - Medium Risk (09/01/2023)   Received from [facility]    Readmission Risk Interventions     No data to display

## 2023-11-11 NOTE — Progress Notes (Signed)
[facility] Progress Note   Subjective:    Seen and examined patient at bedside. No issues. HD this afternoon.  Objective Vitals:   11/10/23 2048 11/11/23 0500 11/11/23 0800 11/11/23 1200  BP: (!) 144/92 (!) 135/90 (!) 127/34 130/61  Pulse: (!) 115 (!) 112 (!) 105   Resp: 18 18 18    Temp: 98.4 F (36.9 C) 98.7 F (37.1 C) (!) 97.4 F (36.3 C) 98.1 F (36.7 C)  TempSrc:   Oral Oral  SpO2: (!) 88% 95% 94%   Weight:      Height:       Physical Exam  General: Alert, chronically ill male NAD Heart: RRR no MRG Lungs: CTA bilaterally RA, unlabored Abdomen: NABS, soft NT ND, trace abdominal edema Extremities: Left AKA trace stump edema, RLE trace edema Dialysis Access: L FEM TDC dressing dry clear  Filed Weights   11/06/23 1424 11/06/23 1734 11/09/23 2008  Weight: 97 kg 93.3 kg 90 kg    Intake/Output Summary (Last 24 hours) at 11/11/2023 1337 Last data filed at 11/11/2023 0900 Gross per 24 hour  Intake 360 ml  Output --  Net 360 ml    Additional Objective Labs: Basic Metabolic Panel: Recent Labs  Lab 11/06/23 0509 11/09/23 1826 11/10/23 0749  NA 139 137 136  K 4.2 4.6 4.5  CL 93* 94* 92*  CO2 26 27 27   GLUCOSE 120* 213* 91  BUN 31* 44* 31*  CREATININE 5.59* 7.16* 5.34*  CALCIUM 9.9 9.1 9.6  PHOS 6.3* 7.7* 6.2*   Liver Function Tests: Recent Labs  Lab 11/06/23 0509 11/09/23 1826 11/10/23 0749  ALBUMIN 3.4* 3.4* 3.6   No results for input(s): "LIPASE", "AMYLASE" in the last 168 hours. CBC: Recent Labs  Lab 11/06/23 0509 11/09/23 1826 11/10/23 0749  WBC 3.8* 4.7 4.0  NEUTROABS 2.2 3.0 2.3  HGB 9.1* 8.8* 10.1*  HCT 30.9* 30.9* 34.4*  MCV 92.8 95.4 93.5  PLT 141* 132* PLATELET CLUMPS NOTED ON SMEAR, COUNT APPEARS DECREASED   Blood Culture    Component Value Date/Time   SDES BLOOD SITE NOT SPECIFIED 08/25/2023 1706   SPECREQUEST  08/25/2023 1706    BOTTLES DRAWN AEROBIC ONLY Blood Culture results may not be optimal due to an  inadequate volume of blood received in culture bottles   CULT  08/25/2023 1706    NO GROWTH 5 DAYS Performed at [facility], [address], [place], [name] [address]    REPTSTATUS 08/30/2023 FINAL 08/25/2023 1706    Cardiac Enzymes: No results for input(s): "CKTOTAL", "CKMB", "CKMBINDEX", "TROPONINI" in the last 168 hours. CBG: Recent Labs  Lab 11/10/23 1129 11/10/23 1621 11/10/23 2047 11/11/23 0826 11/11/23 1224  GLUCAP 108* 205* 280* 248* 190*   Iron Studies: No results for input(s): "IRON", "TIBC", "TRANSFERRIN", "FERRITIN" in the last 72 hours. Lab Results  Component Value Date   INR 1.2 07/22/2020   INR 1.0 05/30/2020   INR 1.2 04/24/2019   Studies/Results: No results found.  Medications:  iron sucrose Stopped (11/09/23 2008)    (feeding supplement) PROSource Plus  30 mL Oral BID BM   apixaban  5 mg Oral BID   bacitracin   Topical Daily   Chlorhexidine Gluconate Cloth  6 each Topical Q0600   darbepoetin (ARANESP) injection - DIALYSIS  150 mcg Subcutaneous Q Sun-1800   docusate sodium  100 mg Oral BID   gabapentin  100 mg Oral TID   Gerhardt's butt cream   Topical BID  hydrocortisone  25 mg Rectal BID   insulin aspart  0-6 Units Subcutaneous TID AC & HS   [START ON 11/12/2023] insulin glargine-yfgn  6 Units Subcutaneous Daily   lactulose  20 g Oral Once   loratadine  10 mg Oral Daily   midodrine  10 mg Oral Q T,Th,Sa-HD   pantoprazole  40 mg Oral Daily   polyethylene glycol  17 g Oral Daily   povidone-Iodine   Topical Q0600   QUEtiapine  12.5 mg Oral q morning   QUEtiapine  25 mg Oral QHS   senna-docusate  2 tablet Oral BID   sevelamer carbonate  2,400 mg Oral TID WC   sodium zirconium cyclosilicate  10 g Oral TID    Dialysis Orders:  MWF - [facility] -> may be will be going to [place] TTS on discharge 4hr, 400/A1.5, EDW 77kg, L femoral TDC, 2K/3Ca bath, no heparin  Assessment/Plan: MRSA bacteremia with MV/TV endocarditis + brain abscess:  Not a  candidate for valve surgery, S/p line holiday and brain abscess drainage (at [facility]). Completed on extensive IV Vanc course on 1/29. S/p transfer to [facility] and drainage 08/31/24. Repeat brain MRI 10/17/23 showed improvement. Acute B CVA/septic emboli- due to above Acute LUE DVT: On Eliquis ESRD: Transitioned to TTS. Next HD this afternoon. HTN/volume: Didn't reach max UF 2/4 2nd low Bps from pain medication and signing off early. CT 10/09/23 with diffuse anasarca. Significantly above EDW but using bed weights. Max UF with HD. On Midodrine with HD and okay to use IV Albumin and extra Midodrine dose PRN. Enforcing fluid restrictions/compliance with full HD time.  Anemia of ESRD: Hgbs 8-9s. Raised Aranesp to . Iron studies from today include: Iron 31, Tsat 15%, and Ferritin 140. On Fe load X 10 doses with HD-1st dose 2/8. Checking labs in AM. Secondary HPTH:  CCA ^, Continue to hold calcitriol for now. Sevelamer  ha been ^ to 3/meals.  Continues to have outside food and refuses any diet except regular (chronic problem with him despite counseling).phos 6.3 . Hyperkalemia: K+ 4.2, now stable. Continue Lokelma 10g TID for now, using low bath intermittently with HD.  As noted above diet indiscretion despite counseling. Checking labs in AM. Prostate fluid collection/abscess = noted per admit team urology consulted at outside hospital per their assessment "fluid collection was 2/2 dilated seminal vesicle with no surg.  Intervention recommended/ID concern for possible abscess patient covered with Vanc. and recommendation for outpatient urology follow-up" Nutrition: Alb 3.4,  continue supplements.  T1DM: per primary Dispo: SNF being arranged - not finalized yet  Salome Holmes, NP [facility] 11/11/2023,1:37 PM  LOS: 63 days

## 2023-11-11 NOTE — Progress Notes (Signed)
Patient refused hydrocortisone suppository medication. patient says he doesn't want anything stuck up his bottom.

## 2023-11-12 LAB — GLUCOSE, CAPILLARY
Glucose-Capillary: 354 mg/dL — ABNORMAL HIGH (ref 70–99)
Glucose-Capillary: 292 mg/dL — ABNORMAL HIGH (ref 70–99)
Glucose-Capillary: 256 mg/dL — ABNORMAL HIGH (ref 70–99)
Glucose-Capillary: 233 mg/dL — ABNORMAL HIGH (ref 70–99)
Glucose-Capillary: 192 mg/dL — ABNORMAL HIGH (ref 70–99)

## 2023-11-12 MED ORDER — INSULIN ASPART 100 UNIT/ML IJ SOLN
0.0000 [IU] | Freq: Every day | INTRAMUSCULAR | Status: DC
Start: 2023-11-12 — End: 2023-11-15

## 2023-11-12 MED ORDER — CHLORHEXIDINE GLUCONATE CLOTH 2 % EX PADS
6.0000 | MEDICATED_PAD | Freq: Every day | CUTANEOUS | Status: DC
  Administered 2023-11-13 – 2023-11-19 (×7): 6 via TOPICAL

## 2023-11-12 MED ORDER — HEPARIN SODIUM (PORCINE) 1000 UNIT/ML IJ SOLN
INTRAMUSCULAR | Status: AC
  Administered 2023-11-12: 1000 [IU]
  Filled 2023-11-12: qty 4

## 2023-11-12 MED ORDER — INSULIN ASPART 100 UNIT/ML IJ SOLN
0.0000 [IU] | Freq: Three times a day (TID) | INTRAMUSCULAR | Status: DC
  Administered 2023-11-12: 3 [IU] via SUBCUTANEOUS
  Administered 2023-11-14: 5 [IU] via SUBCUTANEOUS

## 2023-11-12 MED ORDER — INSULIN GLARGINE-YFGN 100 UNIT/ML ~~LOC~~ SOLN
8.0000 [IU] | Freq: Every day | SUBCUTANEOUS | Status: DC
  Administered 2023-11-13 – 2023-11-14 (×2): 8 [IU] via SUBCUTANEOUS
  Filled 2023-11-12 (×3): qty 0.08

## 2023-11-12 MED ORDER — APIXABAN 5 MG PO TABS
5.0000 mg | ORAL_TABLET | Freq: Once | ORAL | Status: AC
  Administered 2023-11-12: 5 mg via ORAL

## 2023-11-12 NOTE — Progress Notes (Signed)
[facility]                                                                              [name], is a 43 y.o. male, DOB - 1981-05-08, ZOX:[id number] Admit date - 09/09/2023    Outpatient Primary MD for the patient is [name], NP  LOS - 64  days  No chief complaint on file.      Brief summary   Patient is a 43 y.o. 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  While at [facility] he underwent cardiac cath showing non obstructive disease, craniotomy with abscess drainage on 09/01/23,. He had subarachnoid hemorrhage while on IV heparin, right third toe distal necrosis,was seen by podiatry, but family wanted conservative management. Since his tertiary care needs have been completed, he was transferred back to [facility] for further management.   PTOT  recommending SNF and awaiting placement.    Assessment & Plan    MRSA BACTEREMIA WITH TV/MV endocarditis - resolved - Positive MRSA blood cultures. Diagnosed on TEE, managed on Vancomycin. -Underwent left heart catheterization at  [facility] which was significant for nonobstructive disease.  -ID recommended to continue IV vancomycin, patient has completed 8-week treatment course, completed on 10/27/2023   Prostate fluid collection/abscess-resolved -Seen by [facility] urology, per their assessment, fluid collection was secondary to dilated seminal vesicle with no surgical intervention recommended.  -Infectious disease with continued concern for possible abscess, received IV vancomycin with recommendation for outpatient urology follow-up ?  [facility].   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  -Patient was evaluated by neurosurgery at [facility], status post craniotomy on 09/01/23 for abscess drainage.   -Repeat brain MRI 10/17/2023 showed improvement   ESRD on HD, TTS -Nephrology following   Hyperkalemia -Improved with HD. BMET pending today   Anemia of chronic kidney disease -H&H stable on 2/12, CBC pending   CKD mineral bone disease -Continue calcitriol   Acute CVA -MRI confirms large right MCA branch infarct. - MRA head (11/25) significant for large necrotic appearing lesion within the right frontal lobe within area of the prior right MCA territory infarct with vasogenic edema and 8 mm leftward midline shift. - LDL of 34. Hemoglobin A1C of 8.9%. -2D echo 10/13 showed LVEF of 25-30% with concern for AV/MV abnormality -Transesophageal Echocardiogram (10/16 & 10/22) significant for MV/TV vegetations.  -Neurology recommendations for Eliquis.  -PT/OT recommendations for SNF.   -Patient with significant left upper extremity paresis. -Repeat MRI brain on 1/19 showed decreased area of abnormal diffusion restriction within the posterior right frontal operculum compatible with reported abscess, amount of surrounding edema has also decreased.   Primary hypertension -Patient is on metoprolol as an outpatient. Metoprolol held secondary to hypotension with dialysis.   Acute left upper extremity DVT -Continue  Eliquis   Right third toe necrosis - Per documentation, patient/family opting for conservative management and decline surgical management.  - Dr Sharlene Dory discussed with patient's mother on 2/11 who brought up the idea of podiatry, however if no surgical intervention is going to be pursued at this time, likely may not be of benefit. -Does not appear to be acutely infected, continue conservative management at this time.  If infected or necrosis spreads, will consult podiatry or orthopedic  surgery   Diabetes mellitus type 1 - Poorly controlled with hyperglycemia and hypoglycemia, likely due to poor dietary choices.  - Hemoglobin A1C of 8.9%.  CBG (last 3)  Recent Labs    11/12/23 0442 11/12/23 0737 11/12/23 1158  GLUCAP 354* 292* 256*   CBGs uncontrolled -Increase SSI to sensitive scale, Semglee increased to 8 units daily   Gastroparesis -No acute issues.  Cont glycemic control   History of left AKA -Noted. Patient seen by PT/OT with recommendation for SNF on discharge.   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 Patient started on Seroquel for behavioral change management.  Appears to be improving.  Patient very conversant, A&O x 4.   Pressure injury -Left buttock, not present on admission  -Wound care per nursing   Constipation -Patient had type I BM yesterday, with small amount of bright red blood from the hemorrhoids and severe constipation. He was placed on hydrocortisone suppositories, MiraLAX daily, Colace twice daily -Patient however refuses the stool softeners, laxatives or suppositories.  Explained to the patient regarding constipation with narcotics and that if he starts having GI bleed, will have to hold anticoagulation which poses him to have a higher risk for CVA or PE/DVT.  Patient understood the above however still refused the suppository and stool softeners.   Obesity class II Estimated body mass index is 31.08 kg/m as calculated from the following:   Height as of this encounter: 5\' 7"  (1.702 m).   Weight as of this encounter: 90 kg.  Code Status: full  DVT Prophylaxis:   apixaban (ELIQUIS) tablet 5 mg   Level of Care: Level of care: Telemetry Medical Family Communication: Updated patient Disposition Plan:      Remains inpatient appropriate: Currently awaiting placement, will need outpatient HD center as well  Consultants:   ID Nephrology   Antimicrobials:   Anti-infectives (From admission, onward)    Start      Dose/Rate Route Frequency Ordered Stop   10/21/23 1230  vancomycin (VANCOCIN) 750 mg in sodium chloride 0.9 % 250 mL IVPB  Status:  Discontinued        750 mg 265 mL/hr over 60 Minutes Intravenous Every T-Th-Sa (Hemodialysis) 10/21/23 0804 10/21/23 0811   10/21/23 1200  vancomycin (VANCOCIN) 750 mg in sodium chloride 0.9 % 250 mL IVPB        750 mg 250 mL/hr over 60 Minutes Intravenous Every T-Th-Sa (Hemodialysis) 10/21/23 0811 10/27/23 1413   10/16/23 1345  vancomycin (VANCOCIN) 750 mg in sodium chloride 0.9 % 250 mL IVPB  Status:  Discontinued        750 mg 265 mL/hr over 60 Minutes Intravenous Every T-Th-Sa (Hemodialysis) 10/16/23 1257 10/21/23 0804   10/16/23 1200  vancomycin (VANCOREADY) IVPB 750 mg/150 mL  Status:  Discontinued        750 mg 150 mL/hr over 60 Minutes Intravenous Every T-Th-Sa (Hemodialysis) 10/14/23 1843 10/16/23 1257   10/14/23 1845  vancomycin (VANCOREADY) IVPB 500 mg/100 mL        500 mg 100 mL/hr over  60 Minutes Intravenous  Once 10/14/23 1843 10/15/23 0806   10/14/23 1200  vancomycin (VANCOREADY) IVPB 500 mg/100 mL  Status:  Discontinued        500 mg 100 mL/hr over 60 Minutes Intravenous Every T-Th-Sa (Hemodialysis) 10/13/23 1309 10/14/23 1843   10/13/23 1200  vancomycin (VANCOREADY) IVPB 500 mg/100 mL  Status:  Discontinued        500 mg 100 mL/hr over 60 Minutes Intravenous Every M-W-F (Hemodialysis) 10/11/23 1517 10/13/23 1309   10/12/23 1200  vancomycin (VANCOREADY) IVPB 500 mg/100 mL  Status:  Discontinued        500 mg 100 mL/hr over 60 Minutes Intravenous Every T-Th-Sa (Hemodialysis) 10/11/23 1514 10/11/23 1517   09/24/23 1200  vancomycin (VANCOREADY) IVPB 500 mg/100 mL  Status:  Discontinued        500 mg 100 mL/hr over 60 Minutes Intravenous Every M-W-F (Hemodialysis) 09/21/23 1246 10/11/23 1514   09/20/23 1200  vancomycin (VANCOCIN) 750 mg in sodium chloride 0.9 % 250 mL IVPB  Status:  Discontinued        750 mg 265 mL/hr over 60 Minutes  Intravenous Every M-W-F (Hemodialysis) 09/20/23 0446 09/21/23 1246   09/20/23 1045  vancomycin (VANCOCIN) 750 mg in sodium chloride 0.9 % 250 mL IVPB        750 mg 265 mL/hr over 60 Minutes Intravenous  Once 09/20/23 0959 09/20/23 1226   09/20/23 0945  vancomycin (VANCOREADY) IVPB 750 mg/150 mL  Status:  Discontinued        750 mg 150 mL/hr over 60 Minutes Intravenous  Once 09/20/23 0855 09/20/23 0959   09/10/23 1200  vancomycin (VANCOREADY) IVPB 750 mg/150 mL  Status:  Discontinued        750 mg 150 mL/hr over 60 Minutes Intravenous Every M-W-F (Hemodialysis) 09/09/23 2030 09/20/23 0446   09/09/23 2200  linezolid (ZYVOX) tablet 600 mg  Status:  Discontinued        600 mg Oral Every 12 hours 09/09/23 2035 09/10/23 0946          Medications  (feeding supplement) PROSource Plus  30 mL Oral BID BM   apixaban  5 mg Oral BID   bacitracin   Topical Daily   [START ON 11/13/2023] Chlorhexidine Gluconate Cloth  6 each Topical Q0600   darbepoetin (ARANESP) injection - DIALYSIS  150 mcg Subcutaneous Q Sun-1800   docusate sodium  100 mg Oral BID   gabapentin  100 mg Oral TID   Gerhardt's butt cream   Topical BID   hydrocortisone  25 mg Rectal BID   insulin aspart  0-6 Units Subcutaneous TID AC & HS   insulin glargine-yfgn  6 Units Subcutaneous Daily   lactulose  20 g Oral Once   loratadine  10 mg Oral Daily   midodrine  10 mg Oral Q T,Th,Sa-HD   pantoprazole  40 mg Oral Daily   polyethylene glycol  17 g Oral Daily   povidone-Iodine   Topical Q0600   QUEtiapine  12.5 mg Oral q morning   QUEtiapine  25 mg Oral QHS   senna-docusate  2 tablet Oral BID   sevelamer carbonate  2,400 mg Oral TID WC   sodium zirconium cyclosilicate  10 g Oral TID      Subjective:   [name] was seen and examined today.  No acute complaints.  States had a BM yesterday.  No chest pain, shortness of breath, fevers, abdominal pain.   Objective:   Vitals:   11/12/23 0431 11/12/23  0527 11/12/23 0900  11/12/23 1100  BP: (!) 142/92  (!) 149/95 119/73  Pulse: (!) 108  (!) 113 (!) 114  Resp: 18  17 20   Temp: 98.7 F (37.1 C)  (!) 96.8 F (36 C) 98 F (36.7 C)  TempSrc: Oral  Oral Axillary  SpO2: 99% 100% 100% 100%  Weight:      Height:        Intake/Output Summary (Last 24 hours) at 11/12/2023 1435 Last data filed at 11/12/2023 0820 Gross per 24 hour  Intake 240 ml  Output 4500 ml  Net -4260 ml     Wt Readings from Last 3 Encounters:  11/09/23 90 kg  08/24/23 78 kg  04/14/23 (S) 77.6 kg   Physical Exam General: Alert and oriented x 3, NAD Cardiovascular: S1 S2 clear, RRR.  Respiratory: CTAB Gastrointestinal: Soft, nontender, nondistended, NBS Ext: left AKA Neuro: LUE flaccid Skin: Right third toe dry gangrene Psych: Normal affect    Data Reviewed:  I have personally reviewed following labs    CBC Lab Results  Component Value Date   WBC 4.0 11/10/2023   RBC 3.68 (L) 11/10/2023   HGB 10.1 (L) 11/10/2023   HCT 34.4 (L) 11/10/2023   MCV 93.5 11/10/2023   MCH 27.4 11/10/2023   PLT  11/10/2023    PLATELET CLUMPS NOTED ON SMEAR, COUNT APPEARS DECREASED   MCHC 29.4 (L) 11/10/2023   RDW 16.5 (H) 11/10/2023   LYMPHSABS 1.0 11/10/2023   MONOABS 0.3 11/10/2023   EOSABS 0.3 11/10/2023   BASOSABS 0.0 11/10/2023     Last metabolic panel Lab Results  Component Value Date   NA 136 11/10/2023   K 4.5 11/10/2023   CL 92 (L) 11/10/2023   CO2 27 11/10/2023   BUN 31 (H) 11/10/2023   CREATININE 5.34 (H) 11/10/2023   GLUCOSE 91 11/10/2023   GFRNONAA 13 (L) 11/10/2023   GFRAA 7 (L) 05/30/2020   CALCIUM 9.6 11/10/2023   PHOS 6.2 (H) 11/10/2023   PROT 7.7 10/19/2023   PROT 7.8 10/19/2023   ALBUMIN 3.6 11/10/2023   BILITOT 1.3 (H) 10/19/2023   BILITOT 1.3 (H) 10/19/2023   ALKPHOS 150 (H) 10/19/2023   ALKPHOS 150 (H) 10/19/2023   AST 20 10/19/2023   AST 19 10/19/2023   ALT 10 10/19/2023   ALT 8 10/19/2023   ANIONGAP 17 (H) 11/10/2023    CBG (last 3)  Recent  Labs    11/12/23 0442 11/12/23 0737 11/12/23 1158  GLUCAP 354* 292* 256*      Coagulation Profile: No results for input(s): "INR", "PROTIME" in the last 168 hours.   Radiology Studies: I have personally reviewed the imaging studies  No results found.     Thad Ranger M.D. [facility] 11/12/2023, 2:35 PM  Available via Epic secure chat 7am-7pm After 7 pm, please refer to night coverage provider listed on amion.

## 2023-11-12 NOTE — Progress Notes (Addendum)
[facility] Progress Note   Subjective:    Seen and examined patient at bedside. Tolerated yesterday's HD with net UF 4.5L. Appears he's been accepted to [facility] pending insurance approval and HD bed availability. Next HD 2/15.  Objective Vitals:   11/12/23 0431 11/12/23 0527 11/12/23 0900 11/12/23 1100  BP: (!) 142/92  (!) 149/95 119/73  Pulse: (!) 108  (!) 113 (!) 114  Resp: 18  17 20   Temp: 98.7 F (37.1 C)  (!) 96.8 F (36 C) 98 F (36.7 C)  TempSrc: Oral  Oral Axillary  SpO2: 99% 100% 100% 100%  Weight:      Height:       Physical Exam General: Alert, chronically ill male NAD Heart: RRR no MRG Lungs: CTA bilaterally RA, unlabored Abdomen: NABS, soft NT ND, trace abdominal edema Extremities: Left AKA trace stump edema, RLE trace edema Dialysis Access: L FEM TDC dressing dry clear  Filed Weights   11/06/23 1424 11/06/23 1734 11/09/23 2008  Weight: 97 kg 93.3 kg 90 kg    Intake/Output Summary (Last 24 hours) at 11/12/2023 1355 Last data filed at 11/12/2023 0820 Gross per 24 hour  Intake 240 ml  Output 4500 ml  Net -4260 ml    Additional Objective Labs: Basic Metabolic Panel: Recent Labs  Lab 11/06/23 0509 11/09/23 1826 11/10/23 0749  NA 139 137 136  K 4.2 4.6 4.5  CL 93* 94* 92*  CO2 26 27 27   GLUCOSE 120* 213* 91  BUN 31* 44* 31*  CREATININE 5.59* 7.16* 5.34*  CALCIUM 9.9 9.1 9.6  PHOS 6.3* 7.7* 6.2*   Liver Function Tests: Recent Labs  Lab 11/06/23 0509 11/09/23 1826 11/10/23 0749  ALBUMIN 3.4* 3.4* 3.6   No results for input(s): "LIPASE", "AMYLASE" in the last 168 hours. CBC: Recent Labs  Lab 11/06/23 0509 11/09/23 1826 11/10/23 0749  WBC 3.8* 4.7 4.0  NEUTROABS 2.2 3.0 2.3  HGB 9.1* 8.8* 10.1*  HCT 30.9* 30.9* 34.4*  MCV 92.8 95.4 93.5  PLT 141* 132* PLATELET CLUMPS NOTED ON SMEAR, COUNT APPEARS DECREASED   Blood Culture    Component Value Date/Time   SDES BLOOD SITE NOT SPECIFIED 08/25/2023 1706   SPECREQUEST   08/25/2023 1706    BOTTLES DRAWN AEROBIC ONLY Blood Culture results may not be optimal due to an inadequate volume of blood received in culture bottles   CULT  08/25/2023 1706    NO GROWTH 5 DAYS Performed at [facility], [address], [place], [name] [address]    REPTSTATUS 08/30/2023 FINAL 08/25/2023 1706    Cardiac Enzymes: No results for input(s): "CKTOTAL", "CKMB", "CKMBINDEX", "TROPONINI" in the last 168 hours. CBG: Recent Labs  Lab 11/11/23 1559 11/11/23 2159 11/12/23 0442 11/12/23 0737 11/12/23 1158  GLUCAP 267* 329* 354* 292* 256*   Iron Studies: No results for input(s): "IRON", "TIBC", "TRANSFERRIN", "FERRITIN" in the last 72 hours. Lab Results  Component Value Date   INR 1.2 07/22/2020   INR 1.0 05/30/2020   INR 1.2 04/24/2019   Studies/Results: No results found.  Medications:  iron sucrose 100 mg (11/12/23 0501)    (feeding supplement) PROSource Plus  30 mL Oral BID BM   apixaban  5 mg Oral BID   bacitracin   Topical Daily   Chlorhexidine Gluconate Cloth  6 each Topical Q0600   darbepoetin (ARANESP) injection - DIALYSIS  150 mcg Subcutaneous Q Sun-1800   docusate sodium  100 mg Oral BID   gabapentin  100  mg Oral TID   Gerhardt's butt cream   Topical BID   hydrocortisone  25 mg Rectal BID   insulin aspart  0-6 Units Subcutaneous TID AC & HS   insulin glargine-yfgn  6 Units Subcutaneous Daily   lactulose  20 g Oral Once   loratadine  10 mg Oral Daily   midodrine  10 mg Oral Q T,Th,Sa-HD   pantoprazole  40 mg Oral Daily   polyethylene glycol  17 g Oral Daily   povidone-Iodine   Topical Q0600   QUEtiapine  12.5 mg Oral q morning   QUEtiapine  25 mg Oral QHS   senna-docusate  2 tablet Oral BID   sevelamer carbonate  2,400 mg Oral TID WC   sodium zirconium cyclosilicate  10 g Oral TID    Dialysis Orders:  MWF - [facility] -> may be will be going to [place] TTS on discharge 4hr, 400/A1.5, EDW 77kg, L femoral TDC, 2K/3Ca bath, no  heparin  Assessment/Plan: MRSA bacteremia with MV/TV endocarditis + brain abscess:  Not a candidate for valve surgery, S/p line holiday and brain abscess drainage (at [facility]). Completed on extensive IV Vanc course on 1/29. S/p transfer to [facility] and drainage 08/31/24. Repeat brain MRI 10/17/23 showed improvement. Acute B CVA/septic emboli- due to above Acute LUE DVT: On Eliquis ESRD: Transitioned to TTS. Next HD 2/15. HTN/volume: Didn't reach max UF 2/4 2nd low Bps from pain medication and signing off early. CT 10/09/23 with diffuse anasarca. Significantly above EDW but using bed weights. Max UF with HD. On Midodrine with HD and okay to use IV Albumin and extra Midodrine dose PRN. Enforcing fluid restrictions/compliance with full HD time.  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 On Aranesp to . Iron studies from 2/6 include: Iron 31, Tsat 15%, and Ferritin 140. On Fe load X 10 doses with HD-1st dose 2/8. Checking labs in AM. Secondary HPTH:  CCA ^, Continue to hold calcitriol for now. Sevelamer  ha been ^ to 3/meals.  Continues to have outside food and refuses any diet except regular (chronic problem with him despite counseling).phos 6.3 . Hyperkalemia: K+ 4.5, now stable. Continue Lokelma 10g TID for now, using low bath intermittently with HD.  As noted above diet indiscretion despite counseling. Checking labs in AM. Prostate fluid collection/abscess = noted per admit team urology consulted at outside hospital per their assessment "fluid collection was 2/2 dilated seminal vesicle with no surg.  Intervention recommended/ID concern for possible abscess patient covered with Vanc. and recommendation for outpatient urology follow-up" Nutrition: Alb 3.4,  continue supplements.  T1DM: per primary Dispo: SNF being arranged - not finalized yet  Salome Holmes, NP [facility] 11/12/2023,1:55 PM  LOS: 64 days

## 2023-11-13 LAB — GLUCOSE, CAPILLARY
Glucose-Capillary: 111 mg/dL — ABNORMAL HIGH (ref 70–99)
Glucose-Capillary: 120 mg/dL — ABNORMAL HIGH (ref 70–99)
Glucose-Capillary: 111 mg/dL — ABNORMAL HIGH (ref 70–99)
Glucose-Capillary: 141 mg/dL — ABNORMAL HIGH (ref 70–99)

## 2023-11-13 MED ORDER — HEPARIN SODIUM (PORCINE) 1000 UNIT/ML IJ SOLN
INTRAMUSCULAR | Status: AC
  Filled 2023-11-13: qty 1

## 2023-11-13 NOTE — Progress Notes (Signed)
 [facility]                                                                              [name], is a 43 y.o. male, DOB - 1981-04-14, EAV:[id number] Admit date - 09/09/2023    Outpatient Primary MD for the patient is [name], NP  LOS - 65  days  No chief complaint on file.      Brief summary   Patient is a 43 y.o. 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  While at [facility] he underwent cardiac cath showing non obstructive disease, craniotomy with abscess drainage on 09/01/23,. He had subarachnoid hemorrhage while on IV heparin, right third toe distal necrosis,was seen by podiatry, but family wanted conservative management. Since his tertiary care needs have been completed, he was transferred back to [facility] for further management.   PTOT  recommending SNF and awaiting placement.    Assessment & Plan    MRSA BACTEREMIA WITH TV/MV endocarditis - resolved - Positive MRSA blood cultures. Diagnosed on TEE, managed on Vancomycin. -Underwent left heart catheterization at  [facility] which was significant for nonobstructive disease.  -ID recommended to continue IV vancomycin, patient has completed 8-week treatment course on 10/27/2023   Prostate fluid collection/abscess-resolved -Seen by [facility] urology, per their assessment, fluid collection was secondary to dilated seminal vesicle with no surgical intervention recommended.  -Infectious disease with continued concern for possible abscess, received IV vancomycin with recommendation for outpatient urology follow-up ?  [facility].   CNS  emboli with abscess-resolved Cerebritis -Secondary to MRSA endocarditis.  -Patient was evaluated by neurosurgery at [facility], status post craniotomy on 09/01/23 for abscess drainage.   -Repeat brain MRI 10/17/2023 showed improvement   ESRD on HD, TTS -Nephrology following   Hyperkalemia -Improved with HD. BMET pending   Anemia of chronic kidney disease -H&H stable on 2/12, CBC pending   CKD mineral bone disease -Continue calcitriol   Acute CVA -MRI confirms large right MCA branch infarct. - MRA head (11/25) significant for large necrotic appearing lesion within the right frontal lobe within area of the prior right MCA territory infarct with vasogenic edema and 8 mm leftward midline shift. - LDL of 34. Hemoglobin A1C of 8.9%. -2D echo 10/13 showed LVEF of 25-30% with concern for AV/MV abnormality -Transesophageal Echocardiogram (10/16 & 10/22) significant for MV/TV vegetations.  -Neurology recommendations for Eliquis.  -PT/OT recommendations for SNF.   -Patient with significant left upper extremity paresis. -Repeat MRI brain on 1/19 showed decreased area of abnormal diffusion restriction within the posterior right frontal operculum compatible with reported abscess, amount of surrounding edema has also decreased.   Primary hypertension -Patient is on metoprolol as an outpatient. Metoprolol held secondary to hypotension with dialysis.   Acute left upper extremity DVT -Continue Eliquis  Right third toe necrosis - Per documentation, patient/family opting for conservative management and decline surgical management.  - Dr Sharlene Dory discussed with patient's mother on 2/11 who brought up the idea of podiatry, however if no surgical intervention is going to be pursued at this time, likely may not be of benefit. -Does not appear to be acutely infected, continue conservative management at this time.  If infected or necrosis spreads, will consult podiatry or orthopedic surgery   Diabetes  mellitus type 1 - Poorly controlled with hyperglycemia and hypoglycemia, likely due to poor dietary choices.  - Hemoglobin A1C of 8.9%.  CBG (last 3)  Recent Labs    11/12/23 2059 11/13/23 0747 11/13/23 1129  GLUCAP 192* 111* 120*   Continue sensitive SSI, Semglee 8 units daily   Gastroparesis -No acute issues.  Cont glycemic control   History of left AKA -Noted. Patient seen by PT/OT with recommendation for SNF on discharge.   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 Patient started on Seroquel for behavioral change management.  Appears to be improving.  Patient very conversant, A&O x 4.   Pressure injury -Left buttock, not present on admission  -Wound care per nursing   Constipation -Refuses suppositories, enema, continue Colace, MiraLAX   Obesity class II Estimated body mass index is 31.08 kg/m as calculated from the following:   Height as of this encounter: 5\' 7"  (1.702 m).   Weight as of this encounter: 90 kg.  Code Status: full  DVT Prophylaxis:   apixaban (ELIQUIS) tablet 5 mg   Level of Care: Level of care: Telemetry Medical Family Communication: Updated patient Disposition Plan:      Remains inpatient appropriate: Currently awaiting placement, will need outpatient HD center as well  Consultants:   ID Nephrology   Antimicrobials:   Anti-infectives (From admission, onward)    Start     Dose/Rate Route Frequency Ordered Stop   10/21/23 1230  vancomycin (VANCOCIN) 750 mg in sodium chloride 0.9 % 250 mL IVPB  Status:  Discontinued        750 mg 265 mL/hr over 60 Minutes Intravenous Every T-Th-Sa (Hemodialysis) 10/21/23 0804 10/21/23 0811   10/21/23 1200  vancomycin (VANCOCIN) 750 mg in sodium chloride 0.9 % 250 mL IVPB        750 mg 250 mL/hr over 60 Minutes Intravenous Every T-Th-Sa (Hemodialysis) 10/21/23 0811 10/27/23 1413   10/16/23 1345  vancomycin (VANCOCIN) 750 mg in sodium chloride 0.9 % 250 mL IVPB  Status:  Discontinued        750  mg 265 mL/hr over 60 Minutes Intravenous Every T-Th-Sa (Hemodialysis) 10/16/23 1257 10/21/23 0804   10/16/23 1200  vancomycin (VANCOREADY) IVPB 750 mg/150 mL  Status:  Discontinued        750 mg 150 mL/hr over 60 Minutes Intravenous Every T-Th-Sa (Hemodialysis) 10/14/23 1843 10/16/23 1257   10/14/23 1845  vancomycin (VANCOREADY) IVPB 500 mg/100 mL        500 mg 100 mL/hr over 60 Minutes Intravenous  Once 10/14/23 1843 10/15/23 0806   10/14/23 1200  vancomycin (VANCOREADY) IVPB 500 mg/100 mL  Status:  Discontinued        500 mg 100 mL/hr over 60 Minutes Intravenous Every T-Th-Sa (Hemodialysis) 10/13/23 1309 10/14/23 1843   10/13/23 1200  vancomycin (VANCOREADY) IVPB 500 mg/100 mL  Status:  Discontinued        500 mg 100 mL/hr over 60 Minutes Intravenous Every M-W-F (Hemodialysis) 10/11/23 1517 10/13/23 1309   10/12/23 1200  vancomycin (  VANCOREADY) IVPB 500 mg/100 mL  Status:  Discontinued        500 mg 100 mL/hr over 60 Minutes Intravenous Every T-Th-Sa (Hemodialysis) 10/11/23 1514 10/11/23 1517   09/24/23 1200  vancomycin (VANCOREADY) IVPB 500 mg/100 mL  Status:  Discontinued        500 mg 100 mL/hr over 60 Minutes Intravenous Every M-W-F (Hemodialysis) 09/21/23 1246 10/11/23 1514   09/20/23 1200  vancomycin (VANCOCIN) 750 mg in sodium chloride 0.9 % 250 mL IVPB  Status:  Discontinued        750 mg 265 mL/hr over 60 Minutes Intravenous Every M-W-F (Hemodialysis) 09/20/23 0446 09/21/23 1246   09/20/23 1045  vancomycin (VANCOCIN) 750 mg in sodium chloride 0.9 % 250 mL IVPB        750 mg 265 mL/hr over 60 Minutes Intravenous  Once 09/20/23 0959 09/20/23 1226   09/20/23 0945  vancomycin (VANCOREADY) IVPB 750 mg/150 mL  Status:  Discontinued        750 mg 150 mL/hr over 60 Minutes Intravenous  Once 09/20/23 0855 09/20/23 0959   09/10/23 1200  vancomycin (VANCOREADY) IVPB 750 mg/150 mL  Status:  Discontinued        750 mg 150 mL/hr over 60 Minutes Intravenous Every M-W-F (Hemodialysis)  09/09/23 2030 09/20/23 0446   09/09/23 2200  linezolid (ZYVOX) tablet 600 mg  Status:  Discontinued        600 mg Oral Every 12 hours 09/09/23 2035 09/10/23 0946          Medications  (feeding supplement) PROSource Plus  30 mL Oral BID BM   apixaban  5 mg Oral BID   bacitracin   Topical Daily   Chlorhexidine Gluconate Cloth  6 each Topical Q0600   darbepoetin (ARANESP) injection - DIALYSIS  150 mcg Subcutaneous Q Sun-1800   docusate sodium  100 mg Oral BID   gabapentin  100 mg Oral TID   Gerhardt's butt cream   Topical BID   hydrocortisone  25 mg Rectal BID   insulin aspart  0-5 Units Subcutaneous QHS   insulin aspart  0-9 Units Subcutaneous TID WC   insulin glargine-yfgn  8 Units Subcutaneous Daily   lactulose  20 g Oral Once   loratadine  10 mg Oral Daily   midodrine  10 mg Oral Q T,Th,Sa-HD   pantoprazole  40 mg Oral Daily   polyethylene glycol  17 g Oral Daily   povidone-Iodine   Topical Q0600   QUEtiapine  12.5 mg Oral q morning   QUEtiapine  25 mg Oral QHS   senna-docusate  2 tablet Oral BID   sevelamer carbonate  2,400 mg Oral TID WC   sodium zirconium cyclosilicate  10 g Oral TID      Subjective:   [name] was seen and examined today.  No acute complaints.,  No chest pain, shortness of breath, fevers.  Objective:   Vitals:   11/12/23 1737 11/12/23 2101 11/13/23 0546 11/13/23 0857  BP: 93/80 (!) 120/96 108/76 102/87  Pulse: 70 (!) 111 (!) 116 (!) 101  Resp: 18 18 18 18   Temp:  98.2 F (36.8 C) 98.3 F (36.8 C) 98.1 F (36.7 C)  TempSrc:    Oral  SpO2: 93% 94% 96% 93%  Weight:      Height:        Intake/Output Summary (Last 24 hours) at 11/13/2023 1155 Last data filed at 11/13/2023 1610 Gross per 24 hour  Intake 105 ml  Output 0  ml  Net 105 ml     Wt Readings from Last 3 Encounters:  11/09/23 90 kg  08/24/23 78 kg  04/14/23 (S) 77.6 kg   Physical Exam General: Alert and oriented x 3, NAD Cardiovascular: S1 S2 clear, RRR.   Respiratory: CTAB Gastrointestinal: Soft, nontender, nondistended, NBS Ext: left AKA Neuro: L UE flaccid Skin: Right third toe dry gangrene Psych: Normal affect    Data Reviewed:  I have personally reviewed following labs    CBC Lab Results  Component Value Date   WBC 4.0 11/10/2023   RBC 3.68 (L) 11/10/2023   HGB 10.1 (L) 11/10/2023   HCT 34.4 (L) 11/10/2023   MCV 93.5 11/10/2023   MCH 27.4 11/10/2023   PLT  11/10/2023    PLATELET CLUMPS NOTED ON SMEAR, COUNT APPEARS DECREASED   MCHC 29.4 (L) 11/10/2023   RDW 16.5 (H) 11/10/2023   LYMPHSABS 1.0 11/10/2023   MONOABS 0.3 11/10/2023   EOSABS 0.3 11/10/2023   BASOSABS 0.0 11/10/2023     Last metabolic panel Lab Results  Component Value Date   NA 136 11/10/2023   K 4.5 11/10/2023   CL 92 (L) 11/10/2023   CO2 27 11/10/2023   BUN 31 (H) 11/10/2023   CREATININE 5.34 (H) 11/10/2023   GLUCOSE 91 11/10/2023   GFRNONAA 13 (L) 11/10/2023   GFRAA 7 (L) 05/30/2020   CALCIUM 9.6 11/10/2023   PHOS 6.2 (H) 11/10/2023   PROT 7.7 10/19/2023   PROT 7.8 10/19/2023   ALBUMIN 3.6 11/10/2023   BILITOT 1.3 (H) 10/19/2023   BILITOT 1.3 (H) 10/19/2023   ALKPHOS 150 (H) 10/19/2023   ALKPHOS 150 (H) 10/19/2023   AST 20 10/19/2023   AST 19 10/19/2023   ALT 10 10/19/2023   ALT 8 10/19/2023   ANIONGAP 17 (H) 11/10/2023    CBG (last 3)  Recent Labs    11/12/23 2059 11/13/23 0747 11/13/23 1129  GLUCAP 192* 111* 120*      Coagulation Profile: No results for input(s): "INR", "PROTIME" in the last 168 hours.   Radiology Studies: I have personally reviewed the imaging studies  No results found.     Thad Ranger M.D. [facility] 11/13/2023, 11:55 AM  Available via Epic secure chat 7am-7pm After 7 pm, please refer to night coverage provider listed on amion.

## 2023-11-13 NOTE — Progress Notes (Signed)
 [facility] Progress Note   Subjective:    Seen and examined patient at bedside. No issues. HD later this afternoon or tonight.  Objective Vitals:   11/12/23 1737 11/12/23 2101 11/13/23 0546 11/13/23 0857  BP: 93/80 (!) 120/96 108/76 102/87  Pulse: 70 (!) 111 (!) 116 (!) 101  Resp: 18 18 18 18   Temp:  98.2 F (36.8 C) 98.3 F (36.8 C) 98.1 F (36.7 C)  TempSrc:    Oral  SpO2: 93% 94% 96% 93%  Weight:      Height:       Physical Exam General: Alert, chronically ill male NAD Heart: RRR no MRG Lungs: CTA bilaterally RA, unlabored Abdomen: NABS, soft NT ND, trace abdominal edema Extremities: Left AKA trace stump edema, RLE trace edema Dialysis Access: L FEM TDC dressing dry clear  Filed Weights   11/06/23 1424 11/06/23 1734 11/09/23 2008  Weight: 97 kg 93.3 kg 90 kg    Intake/Output Summary (Last 24 hours) at 11/13/2023 1527 Last data filed at 11/13/2023 4401 Gross per 24 hour  Intake --  Output 0 ml  Net 0 ml    Additional Objective Labs: Basic Metabolic Panel: Recent Labs  Lab 11/09/23 1826 11/10/23 0749  NA 137 136  K 4.6 4.5  CL 94* 92*  CO2 27 27  GLUCOSE 213* 91  BUN 44* 31*  CREATININE 7.16* 5.34*  CALCIUM 9.1 9.6  PHOS 7.7* 6.2*   Liver Function Tests: Recent Labs  Lab 11/09/23 1826 11/10/23 0749  ALBUMIN 3.4* 3.6   No results for input(s): "LIPASE", "AMYLASE" in the last 168 hours. CBC: Recent Labs  Lab 11/09/23 1826 11/10/23 0749  WBC 4.7 4.0  NEUTROABS 3.0 2.3  HGB 8.8* 10.1*  HCT 30.9* 34.4*  MCV 95.4 93.5  PLT 132* PLATELET CLUMPS NOTED ON SMEAR, COUNT APPEARS DECREASED   Blood Culture    Component Value Date/Time   SDES BLOOD SITE NOT SPECIFIED 08/25/2023 1706   SPECREQUEST  08/25/2023 1706    BOTTLES DRAWN AEROBIC ONLY Blood Culture results may not be optimal due to an inadequate volume of blood received in culture bottles   CULT  08/25/2023 1706    NO GROWTH 5 DAYS Performed at [facility],  [address] [place], [name] [address]    REPTSTATUS 08/30/2023 FINAL 08/25/2023 1706    Cardiac Enzymes: No results for input(s): "CKTOTAL", "CKMB", "CKMBINDEX", "TROPONINI" in the last 168 hours. CBG: Recent Labs  Lab 11/12/23 1158 11/12/23 1659 11/12/23 2059 11/13/23 0747 11/13/23 1129  GLUCAP 256* 233* 192* 111* 120*   Iron Studies: No results for input(s): "IRON", "TIBC", "TRANSFERRIN", "FERRITIN" in the last 72 hours. Lab Results  Component Value Date   INR 1.2 07/22/2020   INR 1.0 05/30/2020   INR 1.2 04/24/2019   Studies/Results: No results found.  Medications:  iron sucrose 100 mg (11/13/23 1513)    (feeding supplement) PROSource Plus  30 mL Oral BID BM   apixaban  5 mg Oral BID   bacitracin   Topical Daily   Chlorhexidine Gluconate Cloth  6 each Topical Q0600   darbepoetin (ARANESP) injection - DIALYSIS  150 mcg Subcutaneous Q Sun-1800   docusate sodium  100 mg Oral BID   gabapentin  100 mg Oral TID   Gerhardt's butt cream   Topical BID   hydrocortisone  25 mg Rectal BID   insulin aspart  0-5 Units Subcutaneous QHS   insulin aspart  0-9 Units Subcutaneous TID WC  insulin glargine-yfgn  8 Units Subcutaneous Daily   lactulose  20 g Oral Once   loratadine  10 mg Oral Daily   midodrine  10 mg Oral Q T,Th,Sa-HD   pantoprazole  40 mg Oral Daily   polyethylene glycol  17 g Oral Daily   povidone-Iodine   Topical Q0600   QUEtiapine  12.5 mg Oral q morning   QUEtiapine  25 mg Oral QHS   senna-docusate  2 tablet Oral BID   sevelamer carbonate  2,400 mg Oral TID WC   sodium zirconium cyclosilicate  10 g Oral TID    Dialysis Orders:  MWF - [facility] -> may be will be going to [place] TTS on discharge 4hr, 400/A1.5, EDW 77kg, L femoral TDC, 2K/3Ca bath, no heparin  Assessment/Plan: MRSA bacteremia with MV/TV endocarditis + brain abscess:  Not a candidate for valve surgery, S/p line holiday and brain abscess drainage (at [facility]). Completed on extensive IV  Vanc course on 1/29. S/p transfer to [facility] and drainage 08/31/24. Repeat brain MRI 10/17/23 showed improvement. Acute B CVA/septic emboli- due to above Acute LUE DVT: On Eliquis ESRD: Transitioned to TTS. Next HD later this afternoon or tonight. HTN/volume: Didn't reach max UF 2/4 2nd low Bps from pain medication and signing off early. CT 10/09/23 with diffuse anasarca. Significantly above EDW but using bed weights. Max UF with HD. On Midodrine with HD and okay to use IV Albumin and extra Midodrine dose PRN. Enforcing fluid restrictions/compliance with full HD time.  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 On Aranesp to . Iron studies from 2/6 include: Iron 31, Tsat 15%, and Ferritin 140. On Fe load X 10 doses with HD-1st dose 2/8. Checking labs in AM. Secondary HPTH:  CCA ^, Continue to hold calcitriol for now. Sevelamer  ha been ^ to 3/meals.  Continues to have outside food and refuses any diet except regular (chronic problem with him despite counseling).phos 6.3 . Hyperkalemia: K+ 4.5, now stable. Continue Lokelma 10g TID for now, using low bath intermittently with HD.  As noted above diet indiscretion despite counseling. Checking labs in AM. Prostate fluid collection/abscess = noted per admit team urology consulted at outside hospital per their assessment "fluid collection was 2/2 dilated seminal vesicle with no surg.  Intervention recommended/ID concern for possible abscess patient covered with Vanc. and recommendation for outpatient urology follow-up" Nutrition: Alb 3.4,  continue supplements.  T1DM: per primary Dispo: SNF being arranged - not finalized yet  Salome Holmes, NP [facility] 11/13/2023,3:27 PM  LOS: 65 days

## 2023-11-13 NOTE — Plan of Care (Signed)
  Problem: Clinical Measurements: Goal: Ability to maintain clinical measurements within normal limits will improve Outcome: Progressing Goal: Will remain free from infection Outcome: Progressing Goal: Cardiovascular complication will be avoided Outcome: Progressing   Problem: Activity: Goal: Risk for activity intolerance will decrease Outcome: Progressing   Problem: Coping: Goal: Level of anxiety will decrease Outcome: Progressing   Problem: Skin Integrity: Goal: Risk for impaired skin integrity will decrease Outcome: Progressing   Problem: Education: Goal: Ability to describe self-care measures that may prevent or decrease complications (Diabetes Survival Skills Education) will improve Outcome: Progressing Goal: Individualized Educational Video(s) Outcome: Progressing   Problem: Coping: Goal: Ability to adjust to condition or change in health will improve Outcome: Progressing   Problem: Fluid Volume: Goal: Ability to maintain a balanced intake and output will improve Outcome: Progressing   Problem: Health Behavior/Discharge Planning: Goal: Ability to identify and utilize available resources and services will improve Outcome: Progressing Goal: Ability to manage health-related needs will improve Outcome: Progressing   Problem: Metabolic: Goal: Ability to maintain appropriate glucose levels will improve Outcome: Progressing   Problem: Skin Integrity: Goal: Risk for impaired skin integrity will decrease Outcome: Progressing   Problem: Tissue Perfusion: Goal: Adequacy of tissue perfusion will improve Outcome: Progressing   Problem: Education: Goal: Knowledge of disease and its progression will improve Outcome: Progressing Goal: Individualized Educational Video(s) Outcome: Progressing   Problem: Fluid Volume: Goal: Compliance with measures to maintain balanced fluid volume will improve Outcome: Progressing   Problem: Health Behavior/Discharge Planning: Goal:  Ability to manage health-related needs will improve Outcome: Progressing   Problem: Nutritional: Goal: Ability to make healthy dietary choices will improve Outcome: Progressing   Problem: Clinical Measurements: Goal: Complications related to the disease process, condition or treatment will be avoided or minimized Outcome: Progressing

## 2023-11-14 LAB — GLUCOSE, CAPILLARY
Glucose-Capillary: 84 mg/dL (ref 70–99)
Glucose-Capillary: 260 mg/dL — ABNORMAL HIGH (ref 70–99)
Glucose-Capillary: 144 mg/dL — ABNORMAL HIGH (ref 70–99)

## 2023-11-14 NOTE — Progress Notes (Signed)
 [facility]                                                                              [name], is a 43 y.o. male, DOB - 09-Nov-1980, ZOX:[id number] Admit date - 09/09/2023    Outpatient Primary MD for the patient is [name], NP  LOS - 66  days  No chief complaint on file.      Brief summary   Patient is a 42 y.o. 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  While at [facility] he underwent cardiac cath showing non obstructive disease, craniotomy with abscess drainage on 09/01/23,. He had subarachnoid hemorrhage while on IV heparin, right third toe distal necrosis,was seen by podiatry, but family wanted conservative management. Since his tertiary care needs have been completed, he was transferred back to [facility] for further management.   PTOT  recommending SNF and awaiting placement.    Assessment & Plan    MRSA BACTEREMIA WITH TV/MV endocarditis - resolved - Positive MRSA blood cultures. Diagnosed on TEE, managed on Vancomycin. -Underwent left heart catheterization at  [facility] which was significant for nonobstructive disease.  -ID recommended to continue IV vancomycin, patient has completed 8-week treatment course on 10/27/2023   Prostate fluid collection/abscess-resolved -Seen by [facility] urology, per their assessment, fluid collection was secondary to dilated seminal vesicle with no surgical intervention recommended.  -Infectious disease with continued concern for possible abscess, received IV vancomycin with recommendation for outpatient urology follow-up ?  [facility].   CNS  emboli with abscess-resolved Cerebritis -Secondary to MRSA endocarditis.  -Patient was evaluated by neurosurgery at [facility], status post craniotomy on 09/01/23 for abscess drainage.   -Repeat brain MRI 10/17/2023 showed improvement   ESRD on HD, TTS -Nephrology following   Hyperkalemia -Improved with HD.  -Has been refusing labs   Anemia of chronic kidney disease -H&H stable on 2/12 -Has been refusing labs   CKD mineral bone disease -Continue calcitriol   Acute CVA -MRI confirms large right MCA branch infarct. - MRA head (11/25) significant for large necrotic appearing lesion within the right frontal lobe within area of the prior right MCA territory infarct with vasogenic edema and 8 mm leftward midline shift. - LDL of 34. Hemoglobin A1C of 8.9%. -2D echo 10/13 showed LVEF of 25-30% with concern for AV/MV abnormality -Transesophageal Echocardiogram (10/16 & 10/22) significant for MV/TV vegetations.  -Neurology recommendations for Eliquis.  -PT/OT recommendations for SNF.   -Patient with significant left upper extremity paresis. -Repeat MRI brain on 1/19 showed decreased area of abnormal diffusion restriction within the posterior right frontal operculum compatible with reported abscess, amount of surrounding edema has also decreased.   Primary hypertension -Patient is on metoprolol as an outpatient. Metoprolol held secondary to hypotension with dialysis.   Acute left upper  extremity DVT -Continue Eliquis   Right third toe necrosis - Per documentation, patient/family opting for conservative management and decline surgical management.  - Dr Sharlene Dory discussed with patient's mother on 2/11 who brought up the idea of podiatry, however if no surgical intervention is going to be pursued at this time, likely may not be of benefit. -Does not appear to be acutely infected, continue conservative management at this time.  If infected or necrosis spreads, will consult podiatry or orthopedic  surgery   Diabetes mellitus type 1 - Poorly controlled with hyperglycemia and hypoglycemia, likely due to poor dietary choices.  - Hemoglobin A1C of 8.9%.  CBG (last 3)  Recent Labs    11/13/23 1632 11/13/23 1953 11/14/23 0907  GLUCAP 111* 141* 84   Continue sensitive SSI, Semglee 8 units daily   Gastroparesis -No acute issues.  Cont glycemic control   History of left AKA -Noted. Patient seen by PT/OT with recommendation for SNF on discharge.   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 Patient started on Seroquel for behavioral change management.  Appears to be improving.  Patient very conversant, A&O x 4.   Pressure injury -Left buttock, not present on admission  -Wound care per nursing   Constipation -Refuses suppositories, enema, continue Colace, MiraLAX   Obesity class II Estimated body mass index is 31.08 kg/m as calculated from the following:   Height as of this encounter: 5\' 7"  (1.702 m).   Weight as of this encounter: 90 kg.  Code Status: full  DVT Prophylaxis:   apixaban (ELIQUIS) tablet 5 mg   Level of Care: Level of care: Telemetry Medical Family Communication: Updated patient Disposition Plan:      Remains inpatient appropriate: Currently awaiting placement, will need outpatient HD center as well  Consultants:   ID Nephrology   Antimicrobials:   Anti-infectives (From admission, onward)    Start     Dose/Rate Route Frequency Ordered Stop   10/21/23 1230  vancomycin (VANCOCIN) 750 mg in sodium chloride 0.9 % 250 mL IVPB  Status:  Discontinued        750 mg 265 mL/hr over 60 Minutes Intravenous Every T-Th-Sa (Hemodialysis) 10/21/23 0804 10/21/23 0811   10/21/23 1200  vancomycin (VANCOCIN) 750 mg in sodium chloride 0.9 % 250 mL IVPB        750 mg 250 mL/hr over 60 Minutes Intravenous Every T-Th-Sa (Hemodialysis) 10/21/23 0811 10/27/23 1413   10/16/23 1345  vancomycin (VANCOCIN) 750 mg in sodium chloride 0.9 % 250 mL IVPB  Status:   Discontinued        750 mg 265 mL/hr over 60 Minutes Intravenous Every T-Th-Sa (Hemodialysis) 10/16/23 1257 10/21/23 0804   10/16/23 1200  vancomycin (VANCOREADY) IVPB 750 mg/150 mL  Status:  Discontinued        750 mg 150 mL/hr over 60 Minutes Intravenous Every T-Th-Sa (Hemodialysis) 10/14/23 1843 10/16/23 1257   10/14/23 1845  vancomycin (VANCOREADY) IVPB 500 mg/100 mL        500 mg 100 mL/hr over 60 Minutes Intravenous  Once 10/14/23 1843 10/15/23 0806   10/14/23 1200  vancomycin (VANCOREADY) IVPB 500 mg/100 mL  Status:  Discontinued        500 mg 100 mL/hr over 60 Minutes Intravenous Every T-Th-Sa (Hemodialysis) 10/13/23 1309 10/14/23 1843   10/13/23 1200  vancomycin (VANCOREADY) IVPB 500 mg/100 mL  Status:  Discontinued        500 mg 100 mL/hr over 60 Minutes Intravenous Every M-W-F (Hemodialysis) 10/11/23 1517 10/13/23 1309  10/12/23 1200  vancomycin (VANCOREADY) IVPB 500 mg/100 mL  Status:  Discontinued        500 mg 100 mL/hr over 60 Minutes Intravenous Every T-Th-Sa (Hemodialysis) 10/11/23 1514 10/11/23 1517   09/24/23 1200  vancomycin (VANCOREADY) IVPB 500 mg/100 mL  Status:  Discontinued        500 mg 100 mL/hr over 60 Minutes Intravenous Every M-W-F (Hemodialysis) 09/21/23 1246 10/11/23 1514   09/20/23 1200  vancomycin (VANCOCIN) 750 mg in sodium chloride 0.9 % 250 mL IVPB  Status:  Discontinued        750 mg 265 mL/hr over 60 Minutes Intravenous Every M-W-F (Hemodialysis) 09/20/23 0446 09/21/23 1246   09/20/23 1045  vancomycin (VANCOCIN) 750 mg in sodium chloride 0.9 % 250 mL IVPB        750 mg 265 mL/hr over 60 Minutes Intravenous  Once 09/20/23 0959 09/20/23 1226   09/20/23 0945  vancomycin (VANCOREADY) IVPB 750 mg/150 mL  Status:  Discontinued        750 mg 150 mL/hr over 60 Minutes Intravenous  Once 09/20/23 0855 09/20/23 0959   09/10/23 1200  vancomycin (VANCOREADY) IVPB 750 mg/150 mL  Status:  Discontinued        750 mg 150 mL/hr over 60 Minutes Intravenous Every  M-W-F (Hemodialysis) 09/09/23 2030 09/20/23 0446   09/09/23 2200  linezolid (ZYVOX) tablet 600 mg  Status:  Discontinued        600 mg Oral Every 12 hours 09/09/23 2035 09/10/23 0946          Medications  (feeding supplement) PROSource Plus  30 mL Oral BID BM   apixaban  5 mg Oral BID   bacitracin   Topical Daily   Chlorhexidine Gluconate Cloth  6 each Topical Q0600   darbepoetin (ARANESP) injection - DIALYSIS  150 mcg Subcutaneous Q Sun-1800   docusate sodium  100 mg Oral BID   gabapentin  100 mg Oral TID   Gerhardt's butt cream   Topical BID   hydrocortisone  25 mg Rectal BID   insulin aspart  0-5 Units Subcutaneous QHS   insulin aspart  0-9 Units Subcutaneous TID WC   insulin glargine-yfgn  8 Units Subcutaneous Daily   lactulose  20 g Oral Once   loratadine  10 mg Oral Daily   midodrine  10 mg Oral Q T,Th,Sa-HD   pantoprazole  40 mg Oral Daily   polyethylene glycol  17 g Oral Daily   povidone-Iodine   Topical Q0600   QUEtiapine  12.5 mg Oral q morning   QUEtiapine  25 mg Oral QHS   senna-docusate  2 tablet Oral BID   sevelamer carbonate  2,400 mg Oral TID WC   sodium zirconium cyclosilicate  10 g Oral TID      Subjective:   [name] was seen and examined today.  No acute complaints, constipation resolved, states had a BM yesterday.  No chest pain, fevers, shortness of breath, abdominal pain.   Objective:   Vitals:   11/14/23 0111 11/14/23 0221 11/14/23 0454 11/14/23 0939  BP: (!) (P) 128/111 (!) 118/35 101/79 107/70  Pulse:  99 (!) 110   Resp:  18 17 19   Temp:   98.5 F (36.9 C) 98.6 F (37 C)  TempSrc:   Oral   SpO2:  100% 93% (!) 89%  Weight:      Height:        Intake/Output Summary (Last 24 hours) at 11/14/2023 1059 Last data filed at 11/14/2023  0505 Gross per 24 hour  Intake 565 ml  Output 5001 ml  Net -4436 ml     Wt Readings from Last 3 Encounters:  11/09/23 90 kg  08/24/23 78 kg  04/14/23 (S) 77.6 kg   Physical Exam General: Alert  and oriented x 3, NAD Cardiovascular: S1 S2 clear, RRR.  Respiratory: CTAB, no wheezing, rales or rhonchi Gastrointestinal: Soft, nontender, nondistended, NBS Ext: left AKA Neuro: L UE flaccid Skin: Right third toe dry gangrene Psych: Normal affect      Data Reviewed:  I have personally reviewed following labs    CBC Lab Results  Component Value Date   WBC 4.0 11/10/2023   RBC 3.68 (L) 11/10/2023   HGB 10.1 (L) 11/10/2023   HCT 34.4 (L) 11/10/2023   MCV 93.5 11/10/2023   MCH 27.4 11/10/2023   PLT  11/10/2023    PLATELET CLUMPS NOTED ON SMEAR, COUNT APPEARS DECREASED   MCHC 29.4 (L) 11/10/2023   RDW 16.5 (H) 11/10/2023   LYMPHSABS 1.0 11/10/2023   MONOABS 0.3 11/10/2023   EOSABS 0.3 11/10/2023   BASOSABS 0.0 11/10/2023     Last metabolic panel Lab Results  Component Value Date   NA 136 11/10/2023   K 4.5 11/10/2023   CL 92 (L) 11/10/2023   CO2 27 11/10/2023   BUN 31 (H) 11/10/2023   CREATININE 5.34 (H) 11/10/2023   GLUCOSE 91 11/10/2023   GFRNONAA 13 (L) 11/10/2023   GFRAA 7 (L) 05/30/2020   CALCIUM 9.6 11/10/2023   PHOS 6.2 (H) 11/10/2023   PROT 7.7 10/19/2023   PROT 7.8 10/19/2023   ALBUMIN 3.6 11/10/2023   BILITOT 1.3 (H) 10/19/2023   BILITOT 1.3 (H) 10/19/2023   ALKPHOS 150 (H) 10/19/2023   ALKPHOS 150 (H) 10/19/2023   AST 20 10/19/2023   AST 19 10/19/2023   ALT 10 10/19/2023   ALT 8 10/19/2023   ANIONGAP 17 (H) 11/10/2023    CBG (last 3)  Recent Labs    11/13/23 1632 11/13/23 1953 11/14/23 0907  GLUCAP 111* 141* 84      Coagulation Profile: No results for input(s): "INR", "PROTIME" in the last 168 hours.   Radiology Studies: I have personally reviewed the imaging studies  No results found.     Thad Ranger M.D. [facility] 11/14/2023, 10:59 AM  Available via Epic secure chat 7am-7pm After 7 pm, please refer to night coverage provider listed on amion.

## 2023-11-14 NOTE — Progress Notes (Signed)
 [facility] Progress Note   Subjective:    Seen and examined patient at bedside. No issues. Tolerated HD overnight with net UF 5L. Awaiting insurance approval for [facility]. Next HD 2/18.  Objective Vitals:   11/14/23 0111 11/14/23 0221 11/14/23 0454 11/14/23 0939  BP: (!) (P) 128/111 (!) 118/35 101/79 107/70  Pulse:  99 (!) 110   Resp:  18 17 19   Temp:   98.5 F (36.9 C) 98.6 F (37 C)  TempSrc:   Oral   SpO2:  100% 93% (!) 89%  Weight:      Height:       Physical Exam General: Alert, chronically ill male NAD Heart: RRR no MRG Lungs: CTA bilaterally RA, unlabored Abdomen: NABS, soft NT ND, trace abdominal edema Extremities: Left AKA trace stump edema, RLE trace edema Dialysis Access: L FEM TDC dressing dry clear  Filed Weights   11/06/23 1424 11/06/23 1734 11/09/23 2008  Weight: 97 kg 93.3 kg 90 kg    Intake/Output Summary (Last 24 hours) at 11/14/2023 1439 Last data filed at 11/14/2023 0505 Gross per 24 hour  Intake 205 ml  Output 5001 ml  Net -4796 ml    Additional Objective Labs: Basic Metabolic Panel: Recent Labs  Lab 11/09/23 1826 11/10/23 0749  NA 137 136  K 4.6 4.5  CL 94* 92*  CO2 27 27  GLUCOSE 213* 91  BUN 44* 31*  CREATININE 7.16* 5.34*  CALCIUM 9.1 9.6  PHOS 7.7* 6.2*   Liver Function Tests: Recent Labs  Lab 11/09/23 1826 11/10/23 0749  ALBUMIN 3.4* 3.6   No results for input(s): "LIPASE", "AMYLASE" in the last 168 hours. CBC: Recent Labs  Lab 11/09/23 1826 11/10/23 0749  WBC 4.7 4.0  NEUTROABS 3.0 2.3  HGB 8.8* 10.1*  HCT 30.9* 34.4*  MCV 95.4 93.5  PLT 132* PLATELET CLUMPS NOTED ON SMEAR, COUNT APPEARS DECREASED   Blood Culture    Component Value Date/Time   SDES BLOOD SITE NOT SPECIFIED 08/25/2023 1706   SPECREQUEST  08/25/2023 1706    BOTTLES DRAWN AEROBIC ONLY Blood Culture results may not be optimal due to an inadequate volume of blood received in culture bottles   CULT  08/25/2023 1706    NO GROWTH 5  DAYS Performed at [facility], [address], [place], [name] [address]    REPTSTATUS 08/30/2023 FINAL 08/25/2023 1706    Cardiac Enzymes: No results for input(s): "CKTOTAL", "CKMB", "CKMBINDEX", "TROPONINI" in the last 168 hours. CBG: Recent Labs  Lab 11/13/23 0747 11/13/23 1129 11/13/23 1632 11/13/23 1953 11/14/23 0907  GLUCAP 111* 120* 111* 141* 84   Iron Studies: No results for input(s): "IRON", "TIBC", "TRANSFERRIN", "FERRITIN" in the last 72 hours. Lab Results  Component Value Date   INR 1.2 07/22/2020   INR 1.0 05/30/2020   INR 1.2 04/24/2019   Studies/Results: No results found.  Medications:  iron sucrose Stopped (11/13/23 1529)    (feeding supplement) PROSource Plus  30 mL Oral BID BM   apixaban  5 mg Oral BID   bacitracin   Topical Daily   Chlorhexidine Gluconate Cloth  6 each Topical Q0600   darbepoetin (ARANESP) injection - DIALYSIS  150 mcg Subcutaneous Q Sun-1800   docusate sodium  100 mg Oral BID   gabapentin  100 mg Oral TID   Gerhardt's butt cream   Topical BID   hydrocortisone  25 mg Rectal BID   insulin aspart  0-5 Units Subcutaneous QHS   insulin aspart  0-9 Units Subcutaneous TID WC   insulin glargine-yfgn  8 Units Subcutaneous Daily   lactulose  20 g Oral Once   loratadine  10 mg Oral Daily   midodrine  10 mg Oral Q T,Th,Sa-HD   pantoprazole  40 mg Oral Daily   polyethylene glycol  17 g Oral Daily   povidone-Iodine   Topical Q0600   QUEtiapine  12.5 mg Oral q morning   QUEtiapine  25 mg Oral QHS   senna-docusate  2 tablet Oral BID   sevelamer carbonate  2,400 mg Oral TID WC   sodium zirconium cyclosilicate  10 g Oral TID    Dialysis Orders:  MWF - [facility] -> may be will be going to [place] TTS on discharge 4hr, 400/A1.5, EDW 77kg, L femoral TDC, 2K/3Ca bath, no heparin  Assessment/Plan: MRSA bacteremia with MV/TV endocarditis + brain abscess:  Not a candidate for valve surgery, S/p line holiday and brain abscess drainage (at [facility]). Completed on extensive IV Vanc course on 1/29. S/p transfer to [facility] and drainage 08/31/24. Repeat brain MRI 10/17/23 showed improvement. Acute B CVA/septic emboli- due to above Acute LUE DVT: On Eliquis ESRD: Transitioned to TTS. Next HD 2/18. HTN/volume: CT 10/09/23 with diffuse anasarca. Significantly above EDW but using bed weights. Max UF with HD. On Midodrine with HD and okay to use IV Albumin and extra Midodrine dose PRN. Enforcing fluid restrictions/compliance with full HD time.  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 On Aranesp to . Iron studies from 2/6 include: Iron 31, Tsat 15%, and Ferritin 140. On Fe load X 10 doses with HD-1st dose 2/8. Secondary HPTH:  CCA ^, Continue to hold calcitriol for now. Sevelamer  ha been ^ to 3/meals.  Continues to have outside food and refuses any diet except regular (chronic problem with him despite counseling).phos 6.3 . Hyperkalemia: K+ 4.5, now stable. Continue Lokelma 10g TID for now, using low bath intermittently with HD.  As noted above diet indiscretion despite counseling. Checking labs in AM. Prostate fluid collection/abscess = noted per admit team urology consulted at outside hospital per their assessment "fluid collection was 2/2 dilated seminal vesicle with no surg.  Intervention recommended/ID concern for possible abscess patient covered with Vanc. and recommendation for outpatient urology follow-up" Nutrition: Alb 3.4,  continue supplements.  T1DM: per primary Dispo: Awaiting insurance approval for [facility]  Salome Holmes, NP [facility] 11/14/2023,2:39 PM  LOS: 66 days

## 2023-11-14 NOTE — Progress Notes (Signed)
 Despite education, Pt refused his stool softener and Hydrocortisone suppository. MD on call notified.

## 2023-11-14 NOTE — Progress Notes (Signed)
 Informed by Lab Tech that patient refused lab draw this a.m.

## 2023-11-15 LAB — GLUCOSE, CAPILLARY
Glucose-Capillary: 60 mg/dL — ABNORMAL LOW (ref 70–99)
Glucose-Capillary: 167 mg/dL — ABNORMAL HIGH (ref 70–99)
Glucose-Capillary: 243 mg/dL — ABNORMAL HIGH (ref 70–99)
Glucose-Capillary: 283 mg/dL — ABNORMAL HIGH (ref 70–99)

## 2023-11-15 MED ORDER — DEXTROSE 50 % IV SOLN: 12.5000 g | INTRAVENOUS | Status: AC

## 2023-11-15 MED ORDER — DEXTROSE 50 % IV SOLN
INTRAVENOUS | Status: AC
  Administered 2023-11-15: 12.5 g via INTRAVENOUS
  Filled 2023-11-15: qty 50

## 2023-11-15 MED ORDER — INSULIN ASPART 100 UNIT/ML IJ SOLN: 0.0000 [IU] | Freq: Every day | INTRAMUSCULAR | Status: DC

## 2023-11-15 MED ORDER — INSULIN GLARGINE-YFGN 100 UNIT/ML ~~LOC~~ SOLN: 5.0000 [IU] | Freq: Every day | SUBCUTANEOUS | Status: DC

## 2023-11-15 MED ORDER — INSULIN ASPART 100 UNIT/ML IJ SOLN
0.0000 [IU] | Freq: Three times a day (TID) | INTRAMUSCULAR | Status: DC
  Administered 2023-11-15: 2 [IU] via SUBCUTANEOUS
  Administered 2023-11-15: 1 [IU] via SUBCUTANEOUS
  Administered 2023-11-16: 9 [IU] via SUBCUTANEOUS
  Administered 2023-11-16: 6 [IU] via SUBCUTANEOUS

## 2023-11-15 NOTE — Plan of Care (Signed)
 Problem: Clinical Measurements: Goal: Ability to maintain clinical measurements within normal limits will improve Outcome: Progressing Goal: Will remain free from infection Outcome: Progressing Goal: Cardiovascular complication will be avoided Outcome: Progressing   Problem: Activity: Goal: Risk for activity intolerance will decrease Outcome: Progressing   Problem: Coping: Goal: Level of anxiety will decrease Outcome: Progressing   Problem: Skin Integrity: Goal: Risk for impaired skin integrity will decrease Outcome: Progressing   Problem: Education: Goal: Ability to describe self-care measures that may prevent or decrease complications (Diabetes Survival Skills Education) will improve Outcome: Progressing Goal: Individualized Educational Video(s) Outcome: Progressing   Problem: Coping: Goal: Ability to adjust to condition or change in health will improve Outcome: Progressing   Problem: Fluid Volume: Goal: Ability to maintain a balanced intake and output will improve Outcome: Progressing   Problem: Health Behavior/Discharge Planning: Goal: Ability to identify and utilize available resources and services will improve Outcome: Progressing Goal: Ability to manage health-related needs will improve Outcome: Progressing   Problem: Metabolic: Goal: Ability to maintain appropriate glucose levels will improve Outcome: Progressing   Problem: Skin Integrity: Goal: Risk for impaired skin integrity will decrease Outcome: Progressing   Problem: Tissue Perfusion: Goal: Adequacy of tissue perfusion will improve Outcome: Progressing   Problem: Education: Goal: Knowledge of disease and its progression will improve Outcome: Progressing Goal: Individualized Educational Video(s) Outcome: Progressing   Problem: Fluid Volume: Goal: Compliance with measures to maintain balanced fluid volume will improve Outcome: Progressing   Problem: Health Behavior/Discharge Planning: Goal:  Ability to manage health-related needs will improve Outcome: Progressing   Problem: Nutritional: Goal: Ability to make healthy dietary choices will improve Outcome: Progressing   Problem: Clinical Measurements: Goal: Complications related to the disease process, condition or treatment will be avoided or minimized Outcome: Progressing

## 2023-11-15 NOTE — Progress Notes (Signed)
 Informed by Lab Tech that patient refused lab draw this a.m.

## 2023-11-15 NOTE — TOC Progression Note (Signed)
 Transition of Care [facility]) - Progression Note    Patient Details  Name: [name] MRN: [id number] Date of Birth: 1981-05-17  [facility] CM/SW Contact  Tom-Johnson, Hershal Coria, RN Phone Number: 11/15/2023, 11:27 AM  Clinical Narrative:     CM informed by [facility]'s Liaison that  intent to deny LTACH  was issued, patient did not meet LTACH criteria at this time.  MD notified. SNF search continues.   TOC will continue to follow.          Expected Discharge Plan: Skilled Nursing Facility Barriers to Discharge: Continued Medical Work up  Expected Discharge Plan and Services                                               Social Determinants of Health (SDOH) Interventions SDOH Screenings   Food Insecurity: No Food Insecurity (09/09/2023)  Housing: Unknown (09/09/2023)  Transportation Needs: No Transportation Needs (09/09/2023)  Utilities: Not At Risk (09/09/2023)  Alcohol Screen: Low Risk  (02/15/2023)  Depression (PHQ2-9): Low Risk  (02/15/2023)  Financial Resource Strain: Low Risk  (02/15/2023)  Physical Activity: Inactive (02/15/2023)  Social Connections: Socially Integrated (02/15/2023)  Stress: No Stress Concern Present (02/15/2023)  Tobacco Use: Low Risk  (09/12/2023)  Recent Concern: Tobacco Use - Medium Risk (09/01/2023)   Received from [facility]    Readmission Risk Interventions     No data to display

## 2023-11-15 NOTE — Progress Notes (Signed)
 Physical Therapy Treatment Patient Details Name: [name] MRN: [id number] DOB: 1981/04/04 Today's Date: 11/15/2023   History of Present Illness 43 y.o. male presents to [facility] 09/09/23 as a transfer from [facility] for further management of endocarditis. Pt originally was admitted to [facility] 07/09/23 for DKA and sepsis after being found on the ground. 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 10/17 pt had a R MCA, L occipital, and B cerebellum CVA w/ septic emboli. ICU admit 10/18 w/ intubation 10/18-10/24. Pt was transferred to [facility] for MV repair on 11/29. At [facility], pt had cardiac cath w/ non obstructive disease, SAH, a craniotomy w/ abscess drainage on 12/4, and R third toe distal necrosis. PMH: chronic L AKA, MRSA, IDDM, ESRD on HD MWF, HTN, TEE positive for vegetation on multiple valves, cellulitis, medical noncompliance, narcotic dependence, DM with gastroparesis, L internal jugular DVT.    PT Comments  Continuing work on functional mobility and activity tolerance;  Session focused on work towards functional transfers; worked on anterior, right, and Left weight shifts onto R foot on floor; then R and L anterolateral weight shifting, and adding R UE and R LE push to get an element of lift; also performed trunk R and L rotations in unsupported sitting; warming up with Mobility prior to PT seems to be helping with participation   If plan is discharge home, recommend the following: Two people to help with walking and/or transfers;Assist for transportation;Supervision due to cognitive status;Help with stairs or ramp for entrance;Two people to help with bathing/dressing/bathroom;Assistance with cooking/housework;Direct supervision/assist for financial management;Direct supervision/assist for medications management   Can travel by private vehicle     No  Equipment Recommendations  Hoyer lift;Wheelchair (measurements PT);Wheelchair cushion (measurements PT);Other  (comment)    Recommendations for Other Services       Precautions / Restrictions Precautions Precautions: Fall Precaution/Restrictions Comments: L AKA, L hemiparesis and edema, L neglect, bowel incontinence. L femoral HD cath Restrictions Other Position/Activity Restrictions: Pt has sling for LUE when mobilizing OOB; Avoid using L prosthesis on HD catheter     Mobility  Bed Mobility Overal bed mobility: Needs Assistance Bed Mobility: Rolling, Supine to Sit, Sit to Supine Rolling: Mod assist, Max assist   Supine to sit: Max assist, +2 for physical assistance Sit to supine: Contact guard assist Sit to sidelying: +2 for physical assistance, Total assist General bed mobility comments: handheld assist RUE to come to sit EOB    Transfers Overall transfer level: Needs assistance Equipment used:  (2 person hand-on assist) Transfers: Bed to chair/wheelchair/BSC            Lateral/Scoot Transfers: Max assist, +2 physical assistance General transfer comment: Continuing work on weight shifts onto R foot to [employer] hips for scooting; Acheived some hip lift from bed using RUE to push up as well as LLE    Ambulation/Gait                   Stairs             Wheelchair Mobility     Tilt Bed    Modified Rankin (Stroke Patients Only) Modified Rankin (Stroke Patients Only) Pre-Morbid Rankin Score: Moderate disability Modified Rankin: Severe disability     Balance Overall balance assessment: Needs assistance Sitting-balance support: Single extremity supported, Feet supported (R LE supported) Sitting balance-[name]: Fair Sitting balance - Comments: close guard initially and progressed to supervision; [name] sat at [facility] with R foot on the  floor, and his trun was oriented more towards the foot of the bed; Good use of R UE to hold balance, and he was dependent on RUE support initially, but progressed to being able to converse and let go with his R hand (moving his  R hand as he expressed himself)     Standing balance-[name]: Zero                              Communication Communication Communication: Impaired Factors Affecting Communication: Other (comment) (Requires increased time for processing. Benefits from use of short, simple sentences. Often perseverates on topics.)  Cognition Arousal: Alert Behavior During Therapy: Flat affect, WFL for tasks assessed/performed                             Following commands: Impaired (Pt currently demonstrating ability to follow 2-step instructions appropriately in 6/10 attempts with destractions minimized in room (ex. only one staff present, TV sound off, etc.). Pt also with improved instruction following during preferred tasks.) Following commands impaired: Only follows one step commands consistently, Follows one step commands with increased time, Follows multi-step commands inconsistently    Cueing Cueing Techniques: Verbal cues, Tactile cues  Exercises Other Exercises Other Exercises: trunk rotation in unsupported sitting; cued to use RUE to support LUE; to R and L x5    General Comments        Pertinent Vitals/Pain Pain Assessment Pain Assessment: Faces Faces Pain Scale: Hurts a little bit (with bed mobility; sitting EOB) Pain Location: buttock and wounds Pain Descriptors / Indicators: Sore, Discomfort, Grimacing Pain Intervention(s): Monitored during session    Home Living                          Prior Function            PT Goals (current goals can now be found in the care plan section) Acute Rehab PT Goals Patient Stated Goal: "get out of teh hospital"; wants his LUE to move better PT Goal Formulation: With patient Time For Goal Achievement: 11/29/23 Potential to Achieve Goals: Fair Progress towards PT goals: Progressing toward goals;Goals updated (slow progress)    Frequency    Min 1X/week      PT Plan      Co-evaluation               AM-PAC PT "6 Clicks" Mobility   Outcome Measure  Help needed turning from your back to your side while in a flat bed without using bedrails?: A Lot Help needed moving from lying on your back to sitting on the side of a flat bed without using bedrails?: A Lot Help needed moving to and from a bed to a chair (including a wheelchair)?: Total Help needed standing up from a chair using your arms (e.g., wheelchair or bedside chair)?: Total Help needed to walk in hospital room?: Total Help needed climbing 3-5 steps with a railing? : Total 6 Click Score: 8    End of Session Equipment Utilized During Treatment: Other (comment) (bed pad) Activity Tolerance: Other (comment) (self-limiting) Patient left: in bed;with call bell/phone within reach Nurse Communication: Mobility status PT Visit Diagnosis: Other abnormalities of gait and mobility (R26.89);Other symptoms and signs involving the nervous system (R29.898);Hemiplegia and hemiparesis Hemiplegia - Right/Left: Left Hemiplegia - dominant/non-dominant: Non-dominant Hemiplegia - caused by: Cerebral infarction  Time: 7829-5621 PT Time Calculation (min) (ACUTE ONLY): 23 min  Charges:    $Therapeutic Activity: 23-37 mins PT General Charges $$ ACUTE PT VISIT: 1 Visit                     Van Clines, PT  Acute Rehabilitation Services Office [phone] Secure Chat welcomed    [name] 11/15/2023, 2:10 PM

## 2023-11-15 NOTE — Progress Notes (Signed)
 Mobility Specialist Progress Note:    11/15/23 0910  Therapy Vitals  Pulse Rate (!) 123  Resp 18  BP (!) 82/55  Oxygen Therapy  SpO2 95 %  Mobility  Activity Turned to back - supine;Turned to left side;Turned to right side;Moved into chair position in bed  Level of Assistance Contact guard assist, steadying assist  Assistive Device Other (Comment) (Theraband, Bed rails, & HHA)  Range of Motion/Exercises Active;Active Assistive;Passive;Right arm;Left arm  LLE Weight Bearing Per Provider Order NWB  Activity Response Tolerated well  Mobility Referral Yes  Mobility visit 1 Mobility  Mobility Specialist Start Time (ACUTE ONLY) 1020  Mobility Specialist Stop Time (ACUTE ONLY) 1030  Mobility Specialist Time Calculation (min) (ACUTE ONLY) 10 min   Pt received in bed and agreeable. Practiced active flexion/extension on RUE w/ red theraband. Worked on active assistive and passive ROM of LUE. C/o LUE being more sore than usual this date. Pt left in bed w/ call bell and all needs met. Expecting PT session soon.  D'Vante Earlene Plater Mobility Specialist Please contact via Special educational needs teacher or Rehab office at [phone]

## 2023-11-15 NOTE — Progress Notes (Signed)
 [facility]                                                                              [name], is a 43 y.o. male, DOB - 10-06-1980, OZD:[id number] Admit date - 09/09/2023    Outpatient Primary MD for the patient is [name], NP  LOS - 67  days  No chief complaint on file.      Brief summary   Patient is a 43 y.o. male with ESRD on hemodialysis (not fully compliant), insulin-dependent type 1 diabetes, gastroparesis, Left AKA, admitted to [facility] on 07/09/23 for DKA, Sepsis and hospital course significant for MV AND TV  MRSA endocarditis, followed by acute MCA stroke , left occipital and bilateral cerebellar strokes consistent with septic emboli,  Brain abscess from MRSA, left IJ DVT was transferred to [facility] for MV repair on 11/29.  While at [facility] he underwent cardiac cath showing non obstructive disease, craniotomy with abscess drainage on 09/01/23,. He had subarachnoid hemorrhage while on IV heparin, right third toe distal necrosis,was seen by podiatry, but family wanted conservative management. Since his tertiary care needs have been completed, he was transferred back to [facility] for further management.   PTOT  recommending SNF and awaiting placement.    Assessment & Plan    MRSA BACTEREMIA WITH TV/MV endocarditis - resolved - Positive MRSA blood cultures. Diagnosed on TEE, managed on Vancomycin. -Underwent left heart catheterization at  [facility] which was significant for nonobstructive disease.  -ID recommended to continue IV vancomycin, patient has completed 8-week treatment course on 10/27/2023 -No acute issues   Prostate fluid collection/abscess-resolved -Seen by [facility] urology, per their assessment, fluid collection was secondary to dilated seminal vesicle with no surgical intervention recommended.  -Infectious disease with continued concern for possible abscess, received IV vancomycin with recommendation for outpatient urology follow-up ?  [facility]. 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  -Patient was evaluated by neurosurgery at [facility], status post craniotomy on 09/01/23 for abscess drainage.   -Repeat brain MRI 10/17/2023 showed improvement   ESRD on HD, TTS -Nephrology following -Undergoing HD per schedule   Hyperkalemia -Improved with HD.  -Has been refusing labs   Anemia of chronic kidney disease -H&H stable on 2/12 -Has been refusing labs   CKD mineral bone disease -Continue calcitriol   Acute CVA -MRI confirms large right MCA branch infarct. - MRA head (11/25) significant for large necrotic appearing lesion within the right frontal lobe within area of the prior right MCA territory infarct with vasogenic edema and 8 mm leftward midline shift. - LDL of 34. Hemoglobin A1C of 8.9%. -2D echo 10/13 showed LVEF of 25-30% with concern for AV/MV abnormality -Transesophageal Echocardiogram (10/16 & 10/22) significant for MV/TV vegetations.  -Neurology recommendations for Eliquis.  -PT/OT recommendations for SNF.   -Patient with significant left upper extremity paresis. -Repeat MRI brain on 1/19 showed decreased area of abnormal diffusion restriction within the posterior right frontal operculum compatible with reported abscess, amount of surrounding edema has also decreased.   Primary hypertension -Patient is on metoprolol as an outpatient. Metoprolol held  secondary to hypotension with dialysis.   Acute left upper extremity DVT -Continue Eliquis   Right third toe necrosis - Per documentation, patient/family opting for conservative management and decline surgical management.  - Dr Sharlene Dory discussed with patient's mother on 2/11 who brought up the idea of podiatry, however if no surgical intervention is going to be pursued at this time, likely may not be of benefit. -Does not appear to be acutely infected, continue conservative management at this time.  If  infected or necrosis spreads, will consult podiatry or orthopedic surgery   Diabetes mellitus type 1 - Poorly controlled with hyperglycemia and hypoglycemia, likely due to poor dietary choices.  - Hemoglobin A1C of 8.9%.  CBG (last 3)  Recent Labs    11/14/23 2133 11/15/23 0741 11/15/23 1152  GLUCAP 144* 60* 167*   -This morning noted to have hypoglycemia with CBG 60.   -Decrease sliding scale insulin to sensitive, holding Semglee   Gastroparesis -No acute issues.  Cont glycemic control   History of left AKA -Noted. Patient seen by PT/OT with recommendation for SNF on discharge.   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 Patient started on Seroquel for behavioral change management.  Appears to be improving.  Patient very conversant, A&O x 4.   Pressure injury -Left buttock, not present on admission  -Wound care per nursing   Constipation -Refuses suppositories, enema, continue Colace, MiraLAX   Obesity class II Estimated body mass index is 31.08 kg/m as calculated from the following:   Height as of this encounter: 5\' 7"  (1.702 m).   Weight as of this encounter: 90 kg.  Code Status: full  DVT Prophylaxis:   apixaban (ELIQUIS) tablet 5 mg   Level of Care: Level of care: Telemetry Medical Family Communication: Updated patient Disposition Plan:      Remains inpatient appropriate: Currently awaiting placement, will need outpatient HD center as well.  Discussed with [facility] liaison, patient does not meet criteria for LTAC, not on any IV antibiotics, respiratory support.   Consultants:   ID Nephrology   Antimicrobials:   Anti-infectives (From admission, onward)    Start     Dose/Rate Route Frequency Ordered Stop   10/21/23 1230  vancomycin (VANCOCIN) 750 mg in sodium chloride 0.9 % 250 mL IVPB  Status:  Discontinued        750 mg 265 mL/hr over 60 Minutes Intravenous Every T-Th-Sa (Hemodialysis) 10/21/23 0804 10/21/23 0811   10/21/23 1200  vancomycin  (VANCOCIN) 750 mg in sodium chloride 0.9 % 250 mL IVPB        750 mg 250 mL/hr over 60 Minutes Intravenous Every T-Th-Sa (Hemodialysis) 10/21/23 0811 10/27/23 1413   10/16/23 1345  vancomycin (VANCOCIN) 750 mg in sodium chloride 0.9 % 250 mL IVPB  Status:  Discontinued        750 mg 265 mL/hr over 60 Minutes Intravenous Every T-Th-Sa (Hemodialysis) 10/16/23 1257 10/21/23 0804   10/16/23 1200  vancomycin (VANCOREADY) IVPB 750 mg/150 mL  Status:  Discontinued        750 mg 150 mL/hr over 60 Minutes Intravenous Every T-Th-Sa (Hemodialysis) 10/14/23 1843 10/16/23 1257   10/14/23 1845  vancomycin (VANCOREADY) IVPB 500 mg/100 mL        500 mg 100 mL/hr over 60 Minutes Intravenous  Once 10/14/23 1843 10/15/23 0806   10/14/23 1200  vancomycin (VANCOREADY) IVPB 500 mg/100 mL  Status:  Discontinued        500 mg 100 mL/hr over 60 Minutes Intravenous Every T-Th-Sa (  Hemodialysis) 10/13/23 1309 10/14/23 1843   10/13/23 1200  vancomycin (VANCOREADY) IVPB 500 mg/100 mL  Status:  Discontinued        500 mg 100 mL/hr over 60 Minutes Intravenous Every M-W-F (Hemodialysis) 10/11/23 1517 10/13/23 1309   10/12/23 1200  vancomycin (VANCOREADY) IVPB 500 mg/100 mL  Status:  Discontinued        500 mg 100 mL/hr over 60 Minutes Intravenous Every T-Th-Sa (Hemodialysis) 10/11/23 1514 10/11/23 1517   09/24/23 1200  vancomycin (VANCOREADY) IVPB 500 mg/100 mL  Status:  Discontinued        500 mg 100 mL/hr over 60 Minutes Intravenous Every M-W-F (Hemodialysis) 09/21/23 1246 10/11/23 1514   09/20/23 1200  vancomycin (VANCOCIN) 750 mg in sodium chloride 0.9 % 250 mL IVPB  Status:  Discontinued        750 mg 265 mL/hr over 60 Minutes Intravenous Every M-W-F (Hemodialysis) 09/20/23 0446 09/21/23 1246   09/20/23 1045  vancomycin (VANCOCIN) 750 mg in sodium chloride 0.9 % 250 mL IVPB        750 mg 265 mL/hr over 60 Minutes Intravenous  Once 09/20/23 0959 09/20/23 1226   09/20/23 0945  vancomycin (VANCOREADY) IVPB 750 mg/150  mL  Status:  Discontinued        750 mg 150 mL/hr over 60 Minutes Intravenous  Once 09/20/23 0855 09/20/23 0959   09/10/23 1200  vancomycin (VANCOREADY) IVPB 750 mg/150 mL  Status:  Discontinued        750 mg 150 mL/hr over 60 Minutes Intravenous Every M-W-F (Hemodialysis) 09/09/23 2030 09/20/23 0446   09/09/23 2200  linezolid (ZYVOX) tablet 600 mg  Status:  Discontinued        600 mg Oral Every 12 hours 09/09/23 2035 09/10/23 0946          Medications  (feeding supplement) PROSource Plus  30 mL Oral BID BM   apixaban  5 mg Oral BID   bacitracin   Topical Daily   Chlorhexidine Gluconate Cloth  6 each Topical Q0600   darbepoetin (ARANESP) injection - DIALYSIS  150 mcg Subcutaneous Q Sun-1800   docusate sodium  100 mg Oral BID   gabapentin  100 mg Oral TID   Gerhardt's butt cream   Topical BID   hydrocortisone  25 mg Rectal BID   insulin aspart  0-6 Units Subcutaneous TID WC   lactulose  20 g Oral Once   loratadine  10 mg Oral Daily   midodrine  10 mg Oral Q T,Th,Sa-HD   pantoprazole  40 mg Oral Daily   polyethylene glycol  17 g Oral Daily   povidone-Iodine   Topical Q0600   QUEtiapine  12.5 mg Oral q morning   QUEtiapine  25 mg Oral QHS   senna-docusate  2 tablet Oral BID   sevelamer carbonate  2,400 mg Oral TID WC   sodium zirconium cyclosilicate  10 g Oral TID      Subjective:   [name] was seen and examined today.  No acute complaints.  No fevers, chills, chest pain, shortness of breath.    Objective:   Vitals:   11/14/23 1625 11/14/23 2135 11/15/23 0614 11/15/23 0910  BP: (!) 120/92 131/79 (!) 148/107 (!) 82/55  Pulse:  (!) 110  (!) 123  Resp: 18 18 16 18   Temp: 97.8 F (36.6 C) 98.1 F (36.7 C) 97.6 F (36.4 C)   TempSrc: Oral     SpO2: 96% 98%  95%  Weight:  Height:        Intake/Output Summary (Last 24 hours) at 11/15/2023 1252 Last data filed at 11/14/2023 1557 Gross per 24 hour  Intake 360 ml  Output --  Net 360 ml     Wt Readings  from Last 3 Encounters:  11/09/23 90 kg  08/24/23 78 kg  04/14/23 (S) 77.6 kg    Physical Exam General: Alert and oriented x 3, NAD Cardiovascular: S1 S2 clear, RRR.  Respiratory: CTAB, no wheezing Gastrointestinal: Soft, nontender, nondistended, NBS Ext: left AKA Neuro: LUE flaccid Psych: Normal affect      Data Reviewed:  I have personally reviewed following labs    CBC Lab Results  Component Value Date   WBC 4.0 11/10/2023   RBC 3.68 (L) 11/10/2023   HGB 10.1 (L) 11/10/2023   HCT 34.4 (L) 11/10/2023   MCV 93.5 11/10/2023   MCH 27.4 11/10/2023   PLT  11/10/2023    PLATELET CLUMPS NOTED ON SMEAR, COUNT APPEARS DECREASED   MCHC 29.4 (L) 11/10/2023   RDW 16.5 (H) 11/10/2023   LYMPHSABS 1.0 11/10/2023   MONOABS 0.3 11/10/2023   EOSABS 0.3 11/10/2023   BASOSABS 0.0 11/10/2023     Last metabolic panel Lab Results  Component Value Date   NA 136 11/10/2023   K 4.5 11/10/2023   CL 92 (L) 11/10/2023   CO2 27 11/10/2023   BUN 31 (H) 11/10/2023   CREATININE 5.34 (H) 11/10/2023   GLUCOSE 91 11/10/2023   GFRNONAA 13 (L) 11/10/2023   GFRAA 7 (L) 05/30/2020   CALCIUM 9.6 11/10/2023   PHOS 6.2 (H) 11/10/2023   PROT 7.7 10/19/2023   PROT 7.8 10/19/2023   ALBUMIN 3.6 11/10/2023   BILITOT 1.3 (H) 10/19/2023   BILITOT 1.3 (H) 10/19/2023   ALKPHOS 150 (H) 10/19/2023   ALKPHOS 150 (H) 10/19/2023   AST 20 10/19/2023   AST 19 10/19/2023   ALT 10 10/19/2023   ALT 8 10/19/2023   ANIONGAP 17 (H) 11/10/2023    CBG (last 3)  Recent Labs    11/14/23 2133 11/15/23 0741 11/15/23 1152  GLUCAP 144* 60* 167*      Coagulation Profile: No results for input(s): "INR", "PROTIME" in the last 168 hours.   Radiology Studies: I have personally reviewed the imaging studies  No results found.     Thad Ranger M.D. [facility] 11/15/2023, 12:52 PM  Available via Epic secure chat 7am-7pm After 7 pm, please refer to night coverage provider listed on amion.

## 2023-11-15 NOTE — Progress Notes (Signed)
 [facility] Progress Note   Subjective: Seen in room. He is lying naked in bed. Attempted to cover him with blanket for exam and he pulled it off again. Told him I'd return when he was covered.  No C/Os. HD tomorrow.    Objective Vitals:   11/14/23 1625 11/14/23 2135 11/15/23 0614 11/15/23 0910  BP: (!) 120/92 131/79 (!) 148/107 (!) 82/55  Pulse:  (!) 110  (!) 123  Resp: 18 18 16 18   Temp: 97.8 F (36.6 C) 98.1 F (36.7 C) 97.6 F (36.4 C)   TempSrc: Oral     SpO2: 96% 98%  95%  Weight:      Height:       Physical Exam  Not done. Patient is naked, refuses to cover up for me to examine him.   Additional Objective Labs: Basic Metabolic Panel: Recent Labs  Lab 11/09/23 1826 11/10/23 0749  NA 137 136  K 4.6 4.5  CL 94* 92*  CO2 27 27  GLUCOSE 213* 91  BUN 44* 31*  CREATININE 7.16* 5.34*  CALCIUM 9.1 9.6  PHOS 7.7* 6.2*   Liver Function Tests: Recent Labs  Lab 11/09/23 1826 11/10/23 0749  ALBUMIN 3.4* 3.6   No results for input(s): "LIPASE", "AMYLASE" in the last 168 hours. CBC: Recent Labs  Lab 11/09/23 1826 11/10/23 0749  WBC 4.7 4.0  NEUTROABS 3.0 2.3  HGB 8.8* 10.1*  HCT 30.9* 34.4*  MCV 95.4 93.5  PLT 132* PLATELET CLUMPS NOTED ON SMEAR, COUNT APPEARS DECREASED   Blood Culture    Component Value Date/Time   SDES BLOOD SITE NOT SPECIFIED 08/25/2023 1706   SPECREQUEST  08/25/2023 1706    BOTTLES DRAWN AEROBIC ONLY Blood Culture results may not be optimal due to an inadequate volume of blood received in culture bottles   CULT  08/25/2023 1706    NO GROWTH 5 DAYS Performed at [facility], [address], [place], [name] [address]    REPTSTATUS 08/30/2023 FINAL 08/25/2023 1706    Cardiac Enzymes: No results for input(s): "CKTOTAL", "CKMB", "CKMBINDEX", "TROPONINI" in the last 168 hours. CBG: Recent Labs  Lab 11/13/23 1953 11/14/23 0907 11/14/23 1615 11/14/23 2133 11/15/23 0741  GLUCAP 141* 84 260* 144* 60*    Iron Studies: No results for input(s): "IRON", "TIBC", "TRANSFERRIN", "FERRITIN" in the last 72 hours. @lablastinr3 @ Studies/Results: No results found. Medications:  iron sucrose Stopped (11/13/23 1529)    (feeding supplement) PROSource Plus  30 mL Oral BID BM   apixaban  5 mg Oral BID   bacitracin   Topical Daily   Chlorhexidine Gluconate Cloth  6 each Topical Q0600   darbepoetin (ARANESP) injection - DIALYSIS  150 mcg Subcutaneous Q Sun-1800   docusate sodium  100 mg Oral BID   gabapentin  100 mg Oral TID   Gerhardt's butt cream   Topical BID   hydrocortisone  25 mg Rectal BID   insulin aspart  0-6 Units Subcutaneous TID WC   lactulose  20 g Oral Once   loratadine  10 mg Oral Daily   midodrine  10 mg Oral Q T,Th,Sa-HD   pantoprazole  40 mg Oral Daily   polyethylene glycol  17 g Oral Daily   povidone-Iodine   Topical Q0600   QUEtiapine  12.5 mg Oral q morning   QUEtiapine  25 mg Oral QHS   senna-docusate  2 tablet Oral BID   sevelamer carbonate  2,400 mg Oral TID WC   sodium zirconium cyclosilicate  10 g Oral TID     Dialysis Orders:   MWF - [facility] -> may be will be going to [place] TTS on discharge 4hr, 400/A1.5, EDW 77kg, L femoral TDC, 2K/3Ca bath, no heparin   Assessment/Plan: 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 Completed on extensive IV Vanc course on 1/29. S/p transfer to [facility] and drainage 08/31/24. Repeat brain MRI 10/17/23 showed improvement. Acute B CVA/septic emboli- due to above Acute LUE DVT: On Eliquis ESRD: Transitioned to TTS. Next HD 2/18. HTN/volume: CT 10/09/23 with diffuse anasarca. Significantly above EDW but using bed weights. Max UF with HD. On Midodrine with HD and okay to use IV Albumin and extra Midodrine dose PRN. Enforcing fluid restrictions/compliance with full HD time.  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 On Aranesp to  . Iron studies from 2/6 include: Iron 31, Tsat 15%, and Ferritin 140. On Fe load X 10 doses with HD-1st dose 2/8. Secondary HPTH:  CCA ^, Continue to hold calcitriol for now. Sevelamer  ha been ^ to 3/meals.  Continues to have outside food and refuses any diet except regular (chronic problem with him despite counseling).phos 6.3 . Hyperkalemia: K+ 4.5, now stable. Continue Lokelma 10g TID for now, using low bath intermittently with HD.  As noted above diet indiscretion despite counseling. Checking labs in AM. Prostate fluid collection/abscess = noted per admit team urology consulted at outside hospital per their assessment "fluid collection was 2/2 dilated seminal vesicle with no surg.  Intervention recommended/ID concern for possible abscess patient covered with Vanc. and recommendation for outpatient urology follow-up" Nutrition: Alb 3.4,  continue supplements.  T1DM: per primary Dispo: Awaiting insurance approval for [employer]. Gal Feldhaus NP-C 11/15/2023, 11:34 AM  [employer] [phone]

## 2023-11-16 LAB — BASIC METABOLIC PANEL
Anion gap: 14 (ref 5–15)
BUN: 58 mg/dL — ABNORMAL HIGH (ref 6–20)
CO2: 26 mmol/L (ref 22–32)
Calcium: 9.3 mg/dL (ref 8.9–10.3)
Chloride: 93 mmol/L — ABNORMAL LOW (ref 98–111)
Creatinine, Ser: 8.19 mg/dL — ABNORMAL HIGH (ref 0.61–1.24)
GFR, Estimated: 8 mL/min — ABNORMAL LOW (ref >60)
Glucose, Bld: 360 mg/dL — ABNORMAL HIGH (ref 70–99)
Potassium: 5.6 mmol/L — ABNORMAL HIGH (ref 3.5–5.1)
Sodium: 133 mmol/L — ABNORMAL LOW (ref 135–145)

## 2023-11-16 LAB — GLUCOSE, CAPILLARY
Glucose-Capillary: 413 mg/dL — ABNORMAL HIGH (ref 70–99)
Glucose-Capillary: 471 mg/dL — ABNORMAL HIGH (ref 70–99)
Glucose-Capillary: 564 mg/dL (ref 70–99)
Glucose-Capillary: 133 mg/dL — ABNORMAL HIGH (ref 70–99)
Glucose-Capillary: 151 mg/dL — ABNORMAL HIGH (ref 70–99)

## 2023-11-16 LAB — CBC
HCT: 32.5 % — ABNORMAL LOW (ref 39.0–52.0)
Hemoglobin: 9.4 g/dL — ABNORMAL LOW (ref 13.0–17.0)
MCH: 27.3 pg (ref 26.0–34.0)
MCHC: 28.9 g/dL — ABNORMAL LOW (ref 30.0–36.0)
MCV: 94.5 fL (ref 80.0–100.0)
Platelets: 127 10*3/uL — ABNORMAL LOW (ref 150–400)
RBC: 3.44 MIL/uL — ABNORMAL LOW (ref 4.22–5.81)
RDW: 16.8 % — ABNORMAL HIGH (ref 11.5–15.5)
WBC: 3.9 10*3/uL — ABNORMAL LOW (ref 4.0–10.5)
nRBC: 0 % (ref 0.0–0.2)

## 2023-11-16 LAB — PHOSPHORUS: Phosphorus: 7.6 mg/dL — ABNORMAL HIGH (ref 2.5–4.6)

## 2023-11-16 MED ORDER — PENTAFLUOROPROP-TETRAFLUOROETH EX AERO: 1.0000 | INHALATION_SPRAY | CUTANEOUS | Status: DC | PRN

## 2023-11-16 MED ORDER — INSULIN ASPART 100 UNIT/ML IJ SOLN
0.0000 [IU] | Freq: Every day | INTRAMUSCULAR | Status: DC
  Administered 2023-11-19: 3 [IU] via SUBCUTANEOUS
  Administered 2023-11-22: 2 [IU] via SUBCUTANEOUS

## 2023-11-16 MED ORDER — LIDOCAINE HCL (PF) 1 % IJ SOLN: 5.0000 mL | INTRAMUSCULAR | Status: DC | PRN

## 2023-11-16 MED ORDER — HEPARIN SODIUM (PORCINE) 1000 UNIT/ML IJ SOLN
4600.0000 [IU] | Freq: Once | INTRAMUSCULAR | Status: AC
  Administered 2023-11-16: 4600 [IU]
  Filled 2023-11-16: qty 4.6

## 2023-11-16 MED ORDER — LIDOCAINE-PRILOCAINE 2.5-2.5 % EX CREA: 1.0000 | TOPICAL_CREAM | CUTANEOUS | Status: DC | PRN

## 2023-11-16 MED ORDER — ALTEPLASE 2 MG IJ SOLR: 2.0000 mg | Freq: Once | INTRAMUSCULAR | Status: DC | PRN

## 2023-11-16 MED ORDER — ANTICOAGULANT SODIUM CITRATE 4% (200MG/5ML) IV SOLN: 5.0000 mL | Status: DC | PRN

## 2023-11-16 MED ORDER — INSULIN ASPART 100 UNIT/ML IJ SOLN
0.0000 [IU] | Freq: Three times a day (TID) | INTRAMUSCULAR | Status: DC
  Administered 2023-11-17: 3 [IU] via SUBCUTANEOUS
  Administered 2023-11-17: 2 [IU] via SUBCUTANEOUS
  Administered 2023-11-18: 1 [IU] via SUBCUTANEOUS
  Administered 2023-11-18: 2 [IU] via SUBCUTANEOUS
  Administered 2023-11-19: 3 [IU] via SUBCUTANEOUS
  Administered 2023-11-19 – 2023-11-21 (×5): 2 [IU] via SUBCUTANEOUS
  Administered 2023-11-22: 1 [IU] via SUBCUTANEOUS
  Administered 2023-11-22 – 2023-11-24 (×4): 2 [IU] via SUBCUTANEOUS

## 2023-11-16 MED ORDER — HEPARIN SODIUM (PORCINE) 1000 UNIT/ML DIALYSIS
1000.0000 [IU] | INTRAMUSCULAR | Status: DC | PRN
Start: 2023-11-16 — End: 2023-11-16

## 2023-11-16 MED ORDER — INSULIN GLARGINE-YFGN 100 UNIT/ML ~~LOC~~ SOLN
6.0000 [IU] | Freq: Every day | SUBCUTANEOUS | Status: DC
Start: 2023-11-16 — End: 2023-11-22
  Administered 2023-11-16 – 2023-11-22 (×6): 6 [IU] via SUBCUTANEOUS
  Filled 2023-11-16 (×8): qty 0.06

## 2023-11-16 MED ORDER — ALBUMIN HUMAN 25 % IV SOLN
25.0000 g | Freq: Once | INTRAVENOUS | Status: AC
  Administered 2023-11-16: 25 g via INTRAVENOUS
  Filled 2023-11-16: qty 100

## 2023-11-16 NOTE — Progress Notes (Signed)
 [facility] Progress Note   Subjective: Took male RN with me to see pt. He's more cooperative today. HD later today.   He has valid complaint that he has no veins-need to schedule all lab draws on HD days.    Objective Vitals:   11/15/23 1646 11/15/23 1710 11/15/23 2034 11/16/23 0440  BP: (!) 116/56 126/84 (!) 112/46 (!) 122/95  Pulse: (!) 115 (!) 109  (!) 123  Resp: 18 20 19    Temp: 98.4 F (36.9 C) 98.2 F (36.8 C) 97.8 F (36.6 C) 98 F (36.7 C)  TempSrc:  Oral Oral Oral  SpO2: (!) 88% 94% (!) 89% 100%  Weight:      Height:       General: Alert, chronically ill male NAD Heart: RRR no MRG Lungs: CTA bilaterally RA, unlabored Abdomen: NABS, soft NT ND, trace abdominal edema Extremities: Left AKA no stump edema, RLE no edema Dialysis Access: L FEM TDC dressing dry clear   Additional Objective Labs: Basic Metabolic Panel: Recent Labs  Lab 11/09/23 1826 11/10/23 0749  NA 137 136  K 4.6 4.5  CL 94* 92*  CO2 27 27  GLUCOSE 213* 91  BUN 44* 31*  CREATININE 7.16* 5.34*  CALCIUM 9.1 9.6  PHOS 7.7* 6.2*   Liver Function Tests: Recent Labs  Lab 11/09/23 1826 11/10/23 0749  ALBUMIN 3.4* 3.6   No results for input(s): "LIPASE", "AMYLASE" in the last 168 hours. CBC: Recent Labs  Lab 11/09/23 1826 11/10/23 0749  WBC 4.7 4.0  NEUTROABS 3.0 2.3  HGB 8.8* 10.1*  HCT 30.9* 34.4*  MCV 95.4 93.5  PLT 132* PLATELET CLUMPS NOTED ON SMEAR, COUNT APPEARS DECREASED   Blood Culture    Component Value Date/Time   SDES BLOOD SITE NOT SPECIFIED 08/25/2023 1706   SPECREQUEST  08/25/2023 1706    BOTTLES DRAWN AEROBIC ONLY Blood Culture results may not be optimal due to an inadequate volume of blood received in culture bottles   CULT  08/25/2023 1706    NO GROWTH 5 DAYS Performed at [facility], [address] [place], [name] [address]    REPTSTATUS 08/30/2023 FINAL 08/25/2023 1706    Cardiac Enzymes: No results for input(s): "CKTOTAL",  "CKMB", "CKMBINDEX", "TROPONINI" in the last 168 hours. CBG: Recent Labs  Lab 11/15/23 1609 11/15/23 2028 11/16/23 0902 11/16/23 1155 11/16/23 1157  GLUCAP 243* 283* 413* 564* 471*   Iron Studies: No results for input(s): "IRON", "TIBC", "TRANSFERRIN", "FERRITIN" in the last 72 hours. @lablastinr3 @ Studies/Results: No results found. Medications:  anticoagulant sodium citrate     iron sucrose Stopped (11/13/23 1529)    (feeding supplement) PROSource Plus  30 mL Oral BID BM   apixaban  5 mg Oral BID   bacitracin   Topical Daily   Chlorhexidine Gluconate Cloth  6 each Topical Q0600   darbepoetin (ARANESP) injection - DIALYSIS  150 mcg Subcutaneous Q Sun-1800   docusate sodium  100 mg Oral BID   gabapentin  100 mg Oral TID   Gerhardt's butt cream   Topical BID   hydrocortisone  25 mg Rectal BID   insulin aspart  0-5 Units Subcutaneous QHS   insulin aspart  0-9 Units Subcutaneous TID WC   insulin glargine-yfgn  6 Units Subcutaneous Daily   lactulose  20 g Oral Once   loratadine  10 mg Oral Daily   midodrine  10 mg Oral Q T,Th,Sa-HD   pantoprazole  40 mg Oral Daily   polyethylene glycol  17 g Oral Daily   povidone-Iodine   Topical Q0600   QUEtiapine  12.5 mg Oral q morning   QUEtiapine  25 mg Oral QHS   senna-docusate  2 tablet Oral BID   sevelamer carbonate  2,400 mg Oral TID WC   sodium zirconium cyclosilicate  10 g Oral TID     Dialysis Orders:   MWF - [facility] -> may be will be going to [place] TTS on discharge 4hr, 400/A1.5, EDW 77kg, L femoral TDC, 2K/3Ca bath, no heparin   Assessment/Plan: 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 Completed on extensive IV Vanc course on 1/29. S/p transfer to [facility] and drainage 08/31/24. Repeat brain MRI 10/17/23 showed improvement. Acute B CVA/septic emboli- due to above Acute LUE DVT: On Eliquis ESRD:  Transitioned to TTS. Next HD 2/18. HTN/volume: CT 10/09/23 with diffuse anasarca. Significantly above EDW but using bed weights. Max UF with HD. On Midodrine with HD and okay to use IV Albumin and extra Midodrine dose PRN. Enforcing fluid restrictions/compliance with full HD time.  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 On Aranesp to . Iron studies from 2/6 include: Iron 31, Tsat 15%, and Ferritin 140. On Fe load X 10 doses with HD-1st dose 2/8. Secondary HPTH:  CCA ^, Continue to hold calcitriol for now. Sevelamer  ha been ^ to 3/meals.  Continues to have outside food and refuses any diet except regular (chronic problem with him despite counseling).phos 6.3 . Hyperkalemia: K+ 4.5, now stable. Continue Lokelma 10g TID for now, using low bath intermittently with HD.  As noted above diet indiscretion despite counseling. Checking labs in AM. Prostate fluid collection/abscess = noted per admit team urology consulted at outside hospital per their assessment "fluid collection was 2/2 dilated seminal vesicle with no surg.  Intervention recommended/ID concern for possible abscess patient covered with Vanc. and recommendation for outpatient urology follow-up" Nutrition: Alb 3.4,  continue supplements.  T1DM: per primary Dispo: Awaiting insurance approval for [employer]. Trevaughn Schear NP-C 11/16/2023, 12:24 PM  [employer] [phone]

## 2023-11-16 NOTE — Progress Notes (Signed)
 [facility]                                                                              [name], is a 43 y.o. male, DOB - January 22, 1981, EXB:[id number] Admit date - 09/09/2023    Outpatient Primary MD for the patient is [name], NP  LOS - 68  days  No chief complaint on file.      Brief summary   Patient is a 43 y.o. male with ESRD on hemodialysis (not fully compliant), insulin-dependent type 1 diabetes, gastroparesis, Left AKA, admitted to [facility] on 07/09/23 for DKA, Sepsis and hospital course significant for MV AND TV  MRSA endocarditis, followed by acute MCA stroke , left occipital and bilateral cerebellar strokes consistent with septic emboli,  Brain abscess from MRSA, left IJ DVT was transferred to [facility] for MV repair on 11/29.  While at [facility] he underwent cardiac cath showing non obstructive disease, craniotomy with abscess drainage on 09/01/23,. He had subarachnoid hemorrhage while on IV heparin, right third toe distal necrosis,was seen by podiatry, but family wanted conservative management. Since his tertiary care needs have been completed, he was transferred back to [facility] for further management.   PTOT  recommending SNF and awaiting placement.    Assessment & Plan    MRSA BACTEREMIA WITH TV/MV endocarditis - resolved - Positive MRSA blood cultures. Diagnosed on TEE, managed on Vancomycin. -Underwent left heart catheterization at  [facility] which was significant for nonobstructive disease.  -ID recommended to continue IV vancomycin, patient has completed 8-week treatment course on 10/27/2023 -No acute complaints   Prostate fluid collection/abscess-resolved -Seen by [facility] urology, per their assessment, fluid collection was secondary to dilated seminal vesicle with no surgical intervention recommended.  -Infectious disease with continued concern for possible abscess, received IV vancomycin with recommendation for outpatient urology follow-up ?   [facility]. 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  -Patient was evaluated by neurosurgery at [facility], status post craniotomy on 09/01/23 for abscess drainage.   -Repeat brain MRI 10/17/2023 showed improvement   ESRD on HD, TTS -Nephrology following -Undergoing HD per schedule, TTS   Hyperkalemia -Improved with HD.  -Has been refusing labs   Anemia of chronic kidney disease -H&H stable on 2/12 -Has been refusing labs, next labs will be drawn with HD   CKD mineral bone disease -Continue calcitriol   Acute CVA -MRI confirms large right MCA branch infarct. - MRA head (11/25) significant for large necrotic appearing lesion within the right frontal lobe within area of the prior right MCA territory infarct with vasogenic edema and 8 mm leftward midline shift. - LDL of 34. Hemoglobin A1C of 8.9%. -2D echo 10/13 showed LVEF of 25-30% with concern for AV/MV abnormality -Transesophageal Echocardiogram (10/16 & 10/22) significant for MV/TV vegetations.  -Neurology recommendations for Eliquis.  -PT/OT recommendations for SNF.   -Patient with significant left upper extremity paresis. -Repeat MRI brain on 1/19 showed decreased area of abnormal diffusion restriction within the posterior right frontal operculum compatible with reported abscess, amount of surrounding edema has also decreased.   Primary hypertension -Patient  is on metoprolol as an outpatient. Metoprolol held secondary to hypotension with dialysis.   Acute left upper extremity DVT -Continue Eliquis   Right third toe necrosis - Per documentation, patient/family opting for conservative management and decline surgical management.  - Dr Sharlene Dory discussed with patient's mother on 2/11 who brought up the idea of podiatry, however if no surgical intervention is going to be pursued at this time, likely may not be of benefit. -Does not appear to be acutely infected, continue  conservative management at this time.  If infected or necrosis spreads, will consult podiatry or orthopedic surgery   Diabetes mellitus type 1 - Poorly controlled with hyperglycemia and hypoglycemia, likely due to poor dietary choices.  - Hemoglobin A1C of 8.9%.  CBG (last 3)  Recent Labs    11/16/23 0902 11/16/23 1155 11/16/23 1157  GLUCAP 413* 564* 471*   -Poorly controlled due to dietary indiscretion, was eating outside food brought by his family -Placed on sensitive sliding scale insulin, resumed Semglee 6 units daily   Gastroparesis -No acute issues.  Cont glycemic control   History of left AKA -Noted. Patient seen by PT/OT with recommendation for SNF on discharge.   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 Patient started on Seroquel for behavioral change management.  Appears to be improving.  Patient very conversant, A&O x 4.   Pressure injury -Left buttock, not present on admission  -Wound care per nursing   Constipation -Refuses suppositories, enema, continue Colace, MiraLAX   Obesity class II Estimated body mass index is 31.08 kg/m as calculated from the following:   Height as of this encounter: 5\' 7"  (1.702 m).   Weight as of this encounter: 90 kg.  Code Status: full  DVT Prophylaxis:   apixaban (ELIQUIS) tablet 5 mg   Level of Care: Level of care: Telemetry Medical Family Communication: Updated patient Disposition Plan:      Remains inpatient appropriate: Currently awaiting placement, will need outpatient HD center as well.  Discussed with [facility] liaison, patient does not meet criteria for LTAC, not on any IV antibiotics, respiratory support.   Consultants:   ID Nephrology   Antimicrobials:   Anti-infectives (From admission, onward)    Start     Dose/Rate Route Frequency Ordered Stop   10/21/23 1230  vancomycin (VANCOCIN) 750 mg in sodium chloride 0.9 % 250 mL IVPB  Status:  Discontinued        750 mg 265 mL/hr over 60 Minutes  Intravenous Every T-Th-Sa (Hemodialysis) 10/21/23 0804 10/21/23 0811   10/21/23 1200  vancomycin (VANCOCIN) 750 mg in sodium chloride 0.9 % 250 mL IVPB        750 mg 250 mL/hr over 60 Minutes Intravenous Every T-Th-Sa (Hemodialysis) 10/21/23 0811 10/27/23 1413   10/16/23 1345  vancomycin (VANCOCIN) 750 mg in sodium chloride 0.9 % 250 mL IVPB  Status:  Discontinued        750 mg 265 mL/hr over 60 Minutes Intravenous Every T-Th-Sa (Hemodialysis) 10/16/23 1257 10/21/23 0804   10/16/23 1200  vancomycin (VANCOREADY) IVPB 750 mg/150 mL  Status:  Discontinued        750 mg 150 mL/hr over 60 Minutes Intravenous Every T-Th-Sa (Hemodialysis) 10/14/23 1843 10/16/23 1257   10/14/23 1845  vancomycin (VANCOREADY) IVPB 500 mg/100 mL        500 mg 100 mL/hr over 60 Minutes Intravenous  Once 10/14/23 1843 10/15/23 0806   10/14/23 1200  vancomycin (VANCOREADY) IVPB 500 mg/100 mL  Status:  Discontinued  500 mg 100 mL/hr over 60 Minutes Intravenous Every T-Th-Sa (Hemodialysis) 10/13/23 1309 10/14/23 1843   10/13/23 1200  vancomycin (VANCOREADY) IVPB 500 mg/100 mL  Status:  Discontinued        500 mg 100 mL/hr over 60 Minutes Intravenous Every M-W-F (Hemodialysis) 10/11/23 1517 10/13/23 1309   10/12/23 1200  vancomycin (VANCOREADY) IVPB 500 mg/100 mL  Status:  Discontinued        500 mg 100 mL/hr over 60 Minutes Intravenous Every T-Th-Sa (Hemodialysis) 10/11/23 1514 10/11/23 1517   09/24/23 1200  vancomycin (VANCOREADY) IVPB 500 mg/100 mL  Status:  Discontinued        500 mg 100 mL/hr over 60 Minutes Intravenous Every M-W-F (Hemodialysis) 09/21/23 1246 10/11/23 1514   09/20/23 1200  vancomycin (VANCOCIN) 750 mg in sodium chloride 0.9 % 250 mL IVPB  Status:  Discontinued        750 mg 265 mL/hr over 60 Minutes Intravenous Every M-W-F (Hemodialysis) 09/20/23 0446 09/21/23 1246   09/20/23 1045  vancomycin (VANCOCIN) 750 mg in sodium chloride 0.9 % 250 mL IVPB        750 mg 265 mL/hr over 60 Minutes  Intravenous  Once 09/20/23 0959 09/20/23 1226   09/20/23 0945  vancomycin (VANCOREADY) IVPB 750 mg/150 mL  Status:  Discontinued        750 mg 150 mL/hr over 60 Minutes Intravenous  Once 09/20/23 0855 09/20/23 0959   09/10/23 1200  vancomycin (VANCOREADY) IVPB 750 mg/150 mL  Status:  Discontinued        750 mg 150 mL/hr over 60 Minutes Intravenous Every M-W-F (Hemodialysis) 09/09/23 2030 09/20/23 0446   09/09/23 2200  linezolid (ZYVOX) tablet 600 mg  Status:  Discontinued        600 mg Oral Every 12 hours 09/09/23 2035 09/10/23 0946          Medications  (feeding supplement) PROSource Plus  30 mL Oral BID BM   apixaban  5 mg Oral BID   bacitracin   Topical Daily   Chlorhexidine Gluconate Cloth  6 each Topical Q0600   darbepoetin (ARANESP) injection - DIALYSIS  150 mcg Subcutaneous Q Sun-1800   docusate sodium  100 mg Oral BID   gabapentin  100 mg Oral TID   Gerhardt's butt cream   Topical BID   hydrocortisone  25 mg Rectal BID   insulin aspart  0-5 Units Subcutaneous QHS   insulin aspart  0-9 Units Subcutaneous TID WC   insulin glargine-yfgn  6 Units Subcutaneous Daily   lactulose  20 g Oral Once   loratadine  10 mg Oral Daily   midodrine  10 mg Oral Q T,Th,Sa-HD   pantoprazole  40 mg Oral Daily   polyethylene glycol  17 g Oral Daily   povidone-Iodine   Topical Q0600   QUEtiapine  12.5 mg Oral q morning   QUEtiapine  25 mg Oral QHS   senna-docusate  2 tablet Oral BID   sevelamer carbonate  2,400 mg Oral TID WC   sodium zirconium cyclosilicate  10 g Oral TID      Subjective:   [name] was seen and examined today.  No acute complaints today.  No chest pain, shortness of breath, fevers or chills.  Asking about HD today and disposition.   Objective:   Vitals:   11/15/23 2034 11/16/23 0440 11/16/23 0810 11/16/23 1232  BP: (!) 112/46 (!) 122/95 121/84 (!) 118/99  Pulse:  (!) 123 (!) 120 (!) 118  Resp:  19   18  Temp: 97.8 F (36.6 C) 98 F (36.7 C) 98.1 F (36.7  C) (!) 97.4 F (36.3 C)  TempSrc: Oral Oral  Oral  SpO2: (!) 89% 100% 99% 98%  Weight:      Height:        Intake/Output Summary (Last 24 hours) at 11/16/2023 1247 Last data filed at 11/15/2023 1700 Gross per 24 hour  Intake 480 ml  Output --  Net 480 ml     Wt Readings from Last 3 Encounters:  11/09/23 90 kg  08/24/23 78 kg  04/14/23 (S) 77.6 kg    Physical Exam General: Alert and oriented x 3, NAD Cardiovascular: S1 S2 clear, RRR.  Respiratory: CTAB, no wheezing, rales or rhonchi Gastrointestinal: Soft, nontender, nondistended, NBS Ext: left AKA Neuro: LUE flaccid Psych: Normal affect     Data Reviewed:  I have personally reviewed following labs    CBC Lab Results  Component Value Date   WBC 4.0 11/10/2023   RBC 3.68 (L) 11/10/2023   HGB 10.1 (L) 11/10/2023   HCT 34.4 (L) 11/10/2023   MCV 93.5 11/10/2023   MCH 27.4 11/10/2023   PLT  11/10/2023    PLATELET CLUMPS NOTED ON SMEAR, COUNT APPEARS DECREASED   MCHC 29.4 (L) 11/10/2023   RDW 16.5 (H) 11/10/2023   LYMPHSABS 1.0 11/10/2023   MONOABS 0.3 11/10/2023   EOSABS 0.3 11/10/2023   BASOSABS 0.0 11/10/2023     Last metabolic panel Lab Results  Component Value Date   NA 136 11/10/2023   K 4.5 11/10/2023   CL 92 (L) 11/10/2023   CO2 27 11/10/2023   BUN 31 (H) 11/10/2023   CREATININE 5.34 (H) 11/10/2023   GLUCOSE 91 11/10/2023   GFRNONAA 13 (L) 11/10/2023   GFRAA 7 (L) 05/30/2020   CALCIUM 9.6 11/10/2023   PHOS 6.2 (H) 11/10/2023   PROT 7.7 10/19/2023   PROT 7.8 10/19/2023   ALBUMIN 3.6 11/10/2023   BILITOT 1.3 (H) 10/19/2023   BILITOT 1.3 (H) 10/19/2023   ALKPHOS 150 (H) 10/19/2023   ALKPHOS 150 (H) 10/19/2023   AST 20 10/19/2023   AST 19 10/19/2023   ALT 10 10/19/2023   ALT 8 10/19/2023   ANIONGAP 17 (H) 11/10/2023    CBG (last 3)  Recent Labs    11/16/23 0902 11/16/23 1155 11/16/23 1157  GLUCAP 413* 564* 471*      Coagulation Profile: No results for input(s): "INR",  "PROTIME" in the last 168 hours.   Radiology Studies: I have personally reviewed the imaging studies  No results found.     Thad Ranger M.D. [facility] 11/16/2023, 12:47 PM  Available via Epic secure chat 7am-7pm After 7 pm, please refer to night coverage provider listed on amion.

## 2023-11-16 NOTE — Progress Notes (Signed)
Pt. Refused to take vital signs

## 2023-11-16 NOTE — Progress Notes (Addendum)
 Received patient in bed to unit.  Alert and oriented.  Informed consent signed and in chart.   TX duration: 3 hours and 30 minutes.  Patient's BP was low and prevented patient from getting a higher goal.  Patient tolerated well.  Transported back to the room  Alert, without acute distress.  Hand-off given to patient's nurse.   Access used: Left Femoral HD Cath Access issues: none  Total UF removed: 1L Medication(s) given: 300cc bolus, albumin, Klonipin, Iron Sucrose   11/16/23 1815  Vitals  Temp (!) 97.3 F (36.3 C)  Temp Source Axillary  BP 101/78  MAP (mmHg) 87  Pulse Rate (!) 114  ECG Heart Rate (!) 115  Resp 10  Oxygen Therapy  SpO2 95 %  O2 Device Room Air  During Treatment Monitoring  HD Safety Checks Performed Yes  Intra-Hemodialysis Comments Tx completed;Tolerated well  Dialysis Fluid Bolus Normal Saline  Bolus Amount (mL) 300 mL  Post Treatment  Dialyzer Clearance Clear  Liters Processed 84  Fluid Removed (mL) 1000 mL  Tolerated HD Treatment No (Comment)  Post-Hemodialysis Comments Low BP kept patient from hitting a higher goal.  Hemodialysis Catheter Left Femoral vein Double lumen Permanent (Tunneled)  Placement Date/Time: 07/16/23 1318   Serial / Lot #: [id number]  Expiration Date: 05/24/26  Time Out: Correct patient;Correct site;Correct procedure  Maximum sterile barrier precautions: Hand hygiene;Cap;Mask;Sterile gown;Sterile gloves;Large sterile ...  Site Condition No complications  Blue Lumen Status Flushed;Dead end cap in place;Heparin locked  Red Lumen Status Flushed;Heparin locked;Dead end cap in place  Purple Lumen Status N/A  Catheter fill solution Heparin 1000 units/ml  Catheter fill volume (Arterial) 1.6 cc  Catheter fill volume (Venous) 1.6  Dressing Type Transparent  Dressing Status Antimicrobial disc/dressing in place;Clean, Dry, Intact  Interventions Other (Comment) (deaccessed)  Drainage Description None  Dressing Change Due 11/23/23   Post treatment catheter status Capped and Clamped     Stacie Glaze LPN [facility]

## 2023-11-16 NOTE — Plan of Care (Signed)
  Problem: Skin Integrity: Goal: Risk for impaired skin integrity will decrease Outcome: Progressing   Problem: Tissue Perfusion: Goal: Adequacy of tissue perfusion will improve Outcome: Progressing   

## 2023-11-17 LAB — BASIC METABOLIC PANEL
Anion gap: 13 (ref 5–15)
BUN: 34 mg/dL — ABNORMAL HIGH (ref 6–20)
CO2: 25 mmol/L (ref 22–32)
Calcium: 9.4 mg/dL (ref 8.9–10.3)
Chloride: 96 mmol/L — ABNORMAL LOW (ref 98–111)
Creatinine, Ser: 5.38 mg/dL — ABNORMAL HIGH (ref 0.61–1.24)
GFR, Estimated: 13 mL/min — ABNORMAL LOW (ref >60)
Glucose, Bld: 94 mg/dL (ref 70–99)
Potassium: 5.3 mmol/L — ABNORMAL HIGH (ref 3.5–5.1)
Sodium: 134 mmol/L — ABNORMAL LOW (ref 135–145)

## 2023-11-17 LAB — CBC
HCT: 35.1 % — ABNORMAL LOW (ref 39.0–52.0)
Hemoglobin: 10.4 g/dL — ABNORMAL LOW (ref 13.0–17.0)
MCH: 27.5 pg (ref 26.0–34.0)
MCHC: 29.6 g/dL — ABNORMAL LOW (ref 30.0–36.0)
MCV: 92.9 fL (ref 80.0–100.0)
Platelets: 115 10*3/uL — ABNORMAL LOW (ref 150–400)
RBC: 3.78 MIL/uL — ABNORMAL LOW (ref 4.22–5.81)
RDW: 16.8 % — ABNORMAL HIGH (ref 11.5–15.5)
WBC: 3.5 10*3/uL — ABNORMAL LOW (ref 4.0–10.5)
nRBC: 0 % (ref 0.0–0.2)

## 2023-11-17 LAB — GLUCOSE, CAPILLARY
Glucose-Capillary: 184 mg/dL — ABNORMAL HIGH (ref 70–99)
Glucose-Capillary: 237 mg/dL — ABNORMAL HIGH (ref 70–99)
Glucose-Capillary: 163 mg/dL — ABNORMAL HIGH (ref 70–99)

## 2023-11-17 NOTE — Progress Notes (Signed)
  [facility] Progress Note   Subjective: Seen in room, currently getting bath. No C/Os.  He has been denied by [facility]. MSW continuing to search for NSF placement  Objective Vitals:   11/16/23 1818 11/16/23 1826 11/16/23 1855 11/16/23 2215  BP: (!) 115/98  (!) 135/102 (!) 111/42  Pulse: (!) 114  (!) 119 (!) 107  Resp: 12  18   Temp:    97.7 F (36.5 C)  TempSrc:    Oral  SpO2: 93%  99% 94%  Weight:  94.2 kg    Height:       Heart: RRR no MRG Lungs: CTA bilaterally RA, unlabored Abdomen: NABS, soft NT ND, trace abdominal edema Extremities: Left AKA no stump edema, RLE no edema Dialysis Access: L FEM TDC dressing dry clear  Additional Objective Labs: Basic Metabolic Panel: Recent Labs  Lab 11/16/23 1417 11/17/23 0749  NA 133* 134*  K 5.6* 5.3*  CL 93* 96*  CO2 26 25  GLUCOSE 360* 94  BUN 58* 34*  CREATININE 8.19* 5.38*  CALCIUM 9.3 9.4  PHOS 7.6*  --    Liver Function Tests: No results for input(s): "AST", "ALT", "ALKPHOS", "BILITOT", "PROT", "ALBUMIN" in the last 168 hours. No results for input(s): "LIPASE", "AMYLASE" in the last 168 hours. CBC: Recent Labs  Lab 11/16/23 1417 11/17/23 0749  WBC 3.9* 3.5*  HGB 9.4* 10.4*  HCT 32.5* 35.1*  MCV 94.5 92.9  PLT 127* 115*   Blood Culture    Component Value Date/Time   SDES BLOOD SITE NOT SPECIFIED 08/25/2023 1706   SPECREQUEST  08/25/2023 1706    BOTTLES DRAWN AEROBIC ONLY Blood Culture results may not be optimal due to an inadequate volume of blood received in culture bottles   CULT  08/25/2023 1706    NO GROWTH 5 DAYS Performed at [facility], [address] [place], [name] [address]    REPTSTATUS 08/30/2023 FINAL 08/25/2023 1706    Cardiac Enzymes: No results for input(s): "CKTOTAL", "CKMB", "CKMBINDEX", "TROPONINI" in the last 168 hours. CBG: Recent Labs  Lab 11/16/23 0902 11/16/23 1155 11/16/23 1157 11/16/23 1901 11/16/23 2321  GLUCAP 413* 564* 471* 133* 151*    Iron Studies: No results for input(s): "IRON", "TIBC", "TRANSFERRIN", "FERRITIN" in the last 72 hours. @lablastinr3 @ Studies/Results: No results found. Medications:  iron sucrose Stopped (11/16/23 1753)    (feeding supplement) PROSource Plus  30 mL Oral BID BM   apixaban  5 mg Oral BID   bacitracin   Topical Daily   Chlorhexidine Gluconate Cloth  6 each Topical Q0600   darbepoetin (ARANESP) injection - DIALYSIS  150 mcg Subcutaneous Q Sun-1800   docusate sodium  100 mg Oral BID   gabapentin  100 mg Oral TID   Gerhardt's butt cream   Topical BID   hydrocortisone  25 mg Rectal BID   insulin aspart  0-5 Units Subcutaneous QHS   insulin aspart  0-9 Units Subcutaneous TID WC   insulin glargine-yfgn  6 Units Subcutaneous Daily   lactulose  20 g Oral Once   loratadine  10 mg Oral Daily   midodrine  10 mg Oral Q T,Th,Sa-HD   pantoprazole  40 mg Oral Daily   polyethylene glycol  17 g Oral Daily   povidone-Iodine   Topical Q0600   QUEtiapine  12.5 mg Oral q morning   QUEtiapine  25 mg Oral QHS   senna-docusate  2 tablet Oral BID   sevelamer carbonate  2,400 mg Oral TID  WC   sodium zirconium cyclosilicate  10 g Oral TID     Dialysis Orders:   MWF - [facility] -> may be will be going to [place] TTS on discharge 4hr, 400/A1.5, EDW 77kg, L femoral TDC, 2K/3Ca bath, no heparin   Assessment/Plan: 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 Completed on extensive IV Vanc course on 1/29. S/p transfer to [facility] and drainage 08/31/24. Repeat brain MRI 10/17/23 showed improvement. Acute B CVA/septic emboli- due to above Acute LUE DVT: On Eliquis ESRD: Transitioned to TTS. Next HD 11/18/2023 HTN/volume: CT 10/09/23 with diffuse anasarca. Significantly above EDW but using bed weights. Max UF with HD. On Midodrine with HD and okay to use IV Albumin and extra Midodrine dose PRN.  Enforcing fluid restrictions/compliance with full HD time.  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 On Aranesp to . Iron studies from 2/6 include: Iron 31, Tsat 15%, and Ferritin 140. On Fe load X 10 doses with HD-1st dose 2/8. Secondary HPTH:  CCA ^, Continue to hold calcitriol for now. Sevelamer  ha been ^ to 3/meals.  Continues to have outside food and refuses any diet except regular (chronic problem with him despite counseling).phos 6.3 . Hyperkalemia: K+ 4.5, now stable. Continue Lokelma 10g TID for now, using low bath intermittently with HD.  As noted above diet indiscretion despite counseling. Checking labs in AM. Prostate fluid collection/abscess = noted per admit team urology consulted at outside hospital per their assessment "fluid collection was 2/2 dilated seminal vesicle with no surg.  Intervention recommended/ID concern for possible abscess patient covered with Vanc. and recommendation for outpatient urology follow-up" Nutrition: Alb 3.4,  continue supplements.  T1DM: per primary Dispo: Insurance denied [facility]. Continue search for SNF    Avrielle Fry H. Edelyn Heidel NP-C 11/17/2023, 9:35 AM  [employer] [phone]

## 2023-11-17 NOTE — Progress Notes (Signed)
 [facility]                                                                              [name], is a 43 y.o. male, DOB - [birth year], HQI:[id number] Admit date - 09/09/2023    Outpatient Primary MD for the patient is [name], NP  LOS - 69  days  No chief complaint on file.      Brief summary   Patient is a 43 y.o. male with ESRD on hemodialysis (not fully compliant), insulin-dependent type 1 diabetes, gastroparesis, Left AKA, admitted to [facility] on 07/09/23 for DKA, Sepsis and hospital course significant for MV AND TV  MRSA endocarditis, followed by acute MCA stroke , left occipital and bilateral cerebellar strokes consistent with septic emboli,  Brain abscess from MRSA, left IJ DVT was transferred to [facility] for MV repair on 11/29.  While at [facility] he underwent cardiac cath showing non obstructive disease, craniotomy with abscess drainage on 09/01/23,. He had subarachnoid hemorrhage while on IV heparin, right third toe distal necrosis,was seen by podiatry, but family wanted conservative management. Since his tertiary care needs have been completed, he was transferred back to [facility] for further management.   PTOT  recommending SNF and awaiting placement.   Awaiting placement  Assessment & Plan    MRSA BACTEREMIA WITH TV/MV endocarditis - resolved - Positive MRSA blood cultures. Diagnosed on TEE, managed on Vancomycin. -Underwent left heart catheterization at  [facility] which was significant for nonobstructive disease.  -ID recommended to continue IV vancomycin, patient has completed 8-week treatment course on 10/27/2023 -No acute complaints   Prostate fluid collection/abscess-resolved -Seen by [facility] urology, per their assessment, fluid collection was secondary to dilated seminal vesicle with no surgical intervention recommended.  -Infectious disease with continued concern for possible abscess, received IV vancomycin with recommendation for outpatient  urology follow-up ?  [facility]. 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  -Patient was evaluated by neurosurgery at [facility], status post craniotomy on 09/01/23 for abscess drainage.   -Repeat brain MRI 10/17/2023 showed improvement   ESRD on HD, TTS -Nephrology following -Undergoing HD per schedule, TTS   Hyperkalemia -Improved with HD.  -Has been refusing labs   Anemia of chronic kidney disease -H&H stable on 2/12 -Has been refusing labs, next labs will be drawn with HD   CKD mineral bone disease -Continue calcitriol   Acute CVA -MRI confirms large right MCA branch infarct. - MRA head (11/25) significant for large necrotic appearing lesion within the right frontal lobe within area of the prior right MCA territory infarct with vasogenic edema and 8 mm leftward midline shift. - LDL of 34. Hemoglobin A1C of 8.9%. -2D echo 10/13 showed LVEF of 25-30% with concern for AV/MV abnormality -Transesophageal Echocardiogram (10/16 & 10/22) significant for MV/TV vegetations.  -Neurology recommendations for Eliquis.  -PT/OT recommendations for SNF.   -Patient with significant left upper extremity paresis. -Repeat MRI brain on 1/19 showed decreased area of abnormal diffusion restriction within the posterior right frontal operculum compatible with reported abscess, amount of surrounding edema has also decreased.   Primary  hypertension -Patient is on metoprolol as an outpatient. Metoprolol held secondary to hypotension with dialysis.   Acute left upper extremity DVT -Continue Eliquis   Right third toe necrosis - Per documentation, patient/family opting for conservative management and decline surgical management.  - Dr Sharlene Dory discussed with patient's mother on 2/11 who brought up the idea of podiatry, however if no surgical intervention is going to be pursued at this time, likely may not be of benefit. -Does not appear to be acutely  infected, continue conservative management at this time.  If infected or necrosis spreads, will consult podiatry or orthopedic surgery   Diabetes mellitus type 1 - Poorly controlled with hyperglycemia and hypoglycemia, likely due to poor dietary choices.  - Hemoglobin A1C of 8.9%.  CBG (last 3)  Recent Labs    11/16/23 1157 11/16/23 1901 11/16/23 2321  GLUCAP 471* 133* 151*   -Poorly controlled due to dietary indiscretions, eating outside food brought by the family -Continue SSI, Semglee 6 units daily   Gastroparesis -No acute issues.  Cont glycemic control   History of left AKA -Noted. Patient seen by PT/OT with recommendation for SNF on discharge.   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 Patient started on Seroquel for behavioral change management.  Appears to be improving.  Patient very conversant, A&O x 4.   Pressure injury -Left buttock, not present on admission  -Wound care per nursing   Constipation -Refuses suppositories, enema, continue Colace, MiraLAX   Obesity class II Estimated body mass index is 32.53 kg/m as calculated from the following:   Height as of this encounter: 5\' 7"  (1.702 m).   Weight as of this encounter: 94.2 kg.  Code Status: full  DVT Prophylaxis:   apixaban (ELIQUIS) tablet 5 mg   Level of Care: Level of care: Telemetry Medical Family Communication: Updated patient Disposition Plan:      Remains inpatient appropriate: Currently awaiting placement, will need outpatient HD center as well.  Denied by [facility]  Consultants:   ID Nephrology   Antimicrobials:   Anti-infectives (From admission, onward)    Start     Dose/Rate Route Frequency Ordered Stop   10/21/23 1230  vancomycin (VANCOCIN) 750 mg in sodium chloride 0.9 % 250 mL IVPB  Status:  Discontinued        750 mg 265 mL/hr over 60 Minutes Intravenous Every T-Th-Sa (Hemodialysis) 10/21/23 0804 10/21/23 0811   10/21/23 1200  vancomycin (VANCOCIN) 750 mg in sodium  chloride 0.9 % 250 mL IVPB        750 mg 250 mL/hr over 60 Minutes Intravenous Every T-Th-Sa (Hemodialysis) 10/21/23 0811 10/27/23 1413   10/16/23 1345  vancomycin (VANCOCIN) 750 mg in sodium chloride 0.9 % 250 mL IVPB  Status:  Discontinued        750 mg 265 mL/hr over 60 Minutes Intravenous Every T-Th-Sa (Hemodialysis) 10/16/23 1257 10/21/23 0804   10/16/23 1200  vancomycin (VANCOREADY) IVPB 750 mg/150 mL  Status:  Discontinued        750 mg 150 mL/hr over 60 Minutes Intravenous Every T-Th-Sa (Hemodialysis) 10/14/23 1843 10/16/23 1257   10/14/23 1845  vancomycin (VANCOREADY) IVPB 500 mg/100 mL        500 mg 100 mL/hr over 60 Minutes Intravenous  Once 10/14/23 1843 10/15/23 0806   10/14/23 1200  vancomycin (VANCOREADY) IVPB 500 mg/100 mL  Status:  Discontinued        500 mg 100 mL/hr over 60 Minutes Intravenous Every T-Th-Sa (Hemodialysis) 10/13/23 1309 10/14/23 1843  10/13/23 1200  vancomycin (VANCOREADY) IVPB 500 mg/100 mL  Status:  Discontinued        500 mg 100 mL/hr over 60 Minutes Intravenous Every M-W-F (Hemodialysis) 10/11/23 1517 10/13/23 1309   10/12/23 1200  vancomycin (VANCOREADY) IVPB 500 mg/100 mL  Status:  Discontinued        500 mg 100 mL/hr over 60 Minutes Intravenous Every T-Th-Sa (Hemodialysis) 10/11/23 1514 10/11/23 1517   09/24/23 1200  vancomycin (VANCOREADY) IVPB 500 mg/100 mL  Status:  Discontinued        500 mg 100 mL/hr over 60 Minutes Intravenous Every M-W-F (Hemodialysis) 09/21/23 1246 10/11/23 1514   09/20/23 1200  vancomycin (VANCOCIN) 750 mg in sodium chloride 0.9 % 250 mL IVPB  Status:  Discontinued        750 mg 265 mL/hr over 60 Minutes Intravenous Every M-W-F (Hemodialysis) 09/20/23 0446 09/21/23 1246   09/20/23 1045  vancomycin (VANCOCIN) 750 mg in sodium chloride 0.9 % 250 mL IVPB        750 mg 265 mL/hr over 60 Minutes Intravenous  Once 09/20/23 0959 09/20/23 1226   09/20/23 0945  vancomycin (VANCOREADY) IVPB 750 mg/150 mL  Status:  Discontinued         750 mg 150 mL/hr over 60 Minutes Intravenous  Once 09/20/23 0855 09/20/23 0959   09/10/23 1200  vancomycin (VANCOREADY) IVPB 750 mg/150 mL  Status:  Discontinued        750 mg 150 mL/hr over 60 Minutes Intravenous Every M-W-F (Hemodialysis) 09/09/23 2030 09/20/23 0446   09/09/23 2200  linezolid (ZYVOX) tablet 600 mg  Status:  Discontinued        600 mg Oral Every 12 hours 09/09/23 2035 09/10/23 0946          Medications  (feeding supplement) PROSource Plus  30 mL Oral BID BM   apixaban  5 mg Oral BID   bacitracin   Topical Daily   Chlorhexidine Gluconate Cloth  6 each Topical Q0600   darbepoetin (ARANESP) injection - DIALYSIS  150 mcg Subcutaneous Q Sun-1800   docusate sodium  100 mg Oral BID   gabapentin  100 mg Oral TID   Gerhardt's butt cream   Topical BID   hydrocortisone  25 mg Rectal BID   insulin aspart  0-5 Units Subcutaneous QHS   insulin aspart  0-9 Units Subcutaneous TID WC   insulin glargine-yfgn  6 Units Subcutaneous Daily   lactulose  20 g Oral Once   loratadine  10 mg Oral Daily   midodrine  10 mg Oral Q T,Th,Sa-HD   pantoprazole  40 mg Oral Daily   polyethylene glycol  17 g Oral Daily   povidone-Iodine   Topical Q0600   QUEtiapine  12.5 mg Oral q morning   QUEtiapine  25 mg Oral QHS   senna-docusate  2 tablet Oral BID   sevelamer carbonate  2,400 mg Oral TID WC   sodium zirconium cyclosilicate  10 g Oral TID      Subjective:   [name] was seen and examined today.  No acute complaints, asking about disposition.   Objective:   Vitals:   11/16/23 1826 11/16/23 1855 11/16/23 2215 11/17/23 0938  BP:  (!) 135/102 (!) 111/42 131/86  Pulse:  (!) 119 (!) 107 (!) 118  Resp:  18    Temp:   97.7 F (36.5 C)   TempSrc:   Oral   SpO2:  99% 94% 100%  Weight: 94.2 kg  Height:        Intake/Output Summary (Last 24 hours) at 11/17/2023 1057 Last data filed at 11/16/2023 2118 Gross per 24 hour  Intake 360 ml  Output 1000 ml  Net -640 ml      Wt Readings from Last 3 Encounters:  11/16/23 94.2 kg  08/24/23 78 kg  04/14/23 (S) 77.6 kg   Physical Exam General: Alert and oriented x 3, NAD Cardiovascular: S1 S2 clear, RRR.  Respiratory: CTAB Gastrointestinal: Soft, nontender, nondistended, NBS Ext: left AKA Neuro: LUE flaccid Psych: Normal affect      Data Reviewed:  I have personally reviewed following labs    CBC Lab Results  Component Value Date   WBC 3.5 (L) 11/17/2023   RBC 3.78 (L) 11/17/2023   HGB 10.4 (L) 11/17/2023   HCT 35.1 (L) 11/17/2023   MCV 92.9 11/17/2023   MCH 27.5 11/17/2023   PLT 115 (L) 11/17/2023   MCHC 29.6 (L) 11/17/2023   RDW 16.8 (H) 11/17/2023   LYMPHSABS 1.0 11/10/2023   MONOABS 0.3 11/10/2023   EOSABS 0.3 11/10/2023   BASOSABS 0.0 11/10/2023     Last metabolic panel Lab Results  Component Value Date   NA 134 (L) 11/17/2023   K 5.3 (H) 11/17/2023   CL 96 (L) 11/17/2023   CO2 25 11/17/2023   BUN 34 (H) 11/17/2023   CREATININE 5.38 (H) 11/17/2023   GLUCOSE 94 11/17/2023   GFRNONAA 13 (L) 11/17/2023   GFRAA 7 (L) 05/30/2020   CALCIUM 9.4 11/17/2023   PHOS 7.6 (H) 11/16/2023   PROT 7.7 10/19/2023   PROT 7.8 10/19/2023   ALBUMIN 3.6 11/10/2023   BILITOT 1.3 (H) 10/19/2023   BILITOT 1.3 (H) 10/19/2023   ALKPHOS 150 (H) 10/19/2023   ALKPHOS 150 (H) 10/19/2023   AST 20 10/19/2023   AST 19 10/19/2023   ALT 10 10/19/2023   ALT 8 10/19/2023   ANIONGAP 13 11/17/2023    CBG (last 3)  Recent Labs    11/16/23 1157 11/16/23 1901 11/16/23 2321  GLUCAP 471* 133* 151*      Coagulation Profile: No results for input(s): "INR", "PROTIME" in the last 168 hours.   Radiology Studies: I have personally reviewed the imaging studies  No results found.     Thad Ranger M.D. [facility] 11/17/2023, 10:57 AM  Available via Epic secure chat 7am-7pm After 7 pm, please refer to night coverage provider listed on amion.

## 2023-11-17 NOTE — Plan of Care (Signed)
  Problem: Clinical Measurements: Goal: Ability to maintain clinical measurements within normal limits will improve Outcome: Progressing Goal: Will remain free from infection Outcome: Progressing Goal: Cardiovascular complication will be avoided Outcome: Progressing   Problem: Activity: Goal: Risk for activity intolerance will decrease Outcome: Progressing   Problem: Coping: Goal: Level of anxiety will decrease Outcome: Progressing   Problem: Skin Integrity: Goal: Risk for impaired skin integrity will decrease Outcome: Progressing   Problem: Education: Goal: Ability to describe self-care measures that may prevent or decrease complications (Diabetes Survival Skills Education) will improve Outcome: Progressing Goal: Individualized Educational Video(s) Outcome: Progressing   Problem: Coping: Goal: Ability to adjust to condition or change in health will improve Outcome: Progressing   Problem: Fluid Volume: Goal: Ability to maintain a balanced intake and output will improve Outcome: Progressing   Problem: Health Behavior/Discharge Planning: Goal: Ability to identify and utilize available resources and services will improve Outcome: Progressing Goal: Ability to manage health-related needs will improve Outcome: Progressing   Problem: Metabolic: Goal: Ability to maintain appropriate glucose levels will improve Outcome: Progressing   Problem: Skin Integrity: Goal: Risk for impaired skin integrity will decrease Outcome: Progressing   Problem: Tissue Perfusion: Goal: Adequacy of tissue perfusion will improve Outcome: Progressing   Problem: Education: Goal: Knowledge of disease and its progression will improve Outcome: Progressing Goal: Individualized Educational Video(s) Outcome: Progressing   Problem: Fluid Volume: Goal: Compliance with measures to maintain balanced fluid volume will improve Outcome: Progressing   Problem: Health Behavior/Discharge Planning: Goal:  Ability to manage health-related needs will improve Outcome: Progressing   Problem: Nutritional: Goal: Ability to make healthy dietary choices will improve Outcome: Progressing   Problem: Clinical Measurements: Goal: Complications related to the disease process, condition or treatment will be avoided or minimized Outcome: Progressing   Problem: Clinical Measurements: Goal: Will remain free from infection Outcome: Progressing

## 2023-11-17 NOTE — Plan of Care (Signed)
  Problem: Coping: Goal: Level of anxiety will decrease Outcome: Progressing  Interventions Identify therapies to reduce anxiety Identify effective coping behavior Implement reorientation process Assist with coping with condition and its effect Manage negative environmental stimuli Evaluate support systems Encourage diversional activities

## 2023-11-17 NOTE — Progress Notes (Signed)
 OT Cancellation Note  Patient Details Name: [name] MRN: [id number] DOB: 06-07-1981   Cancelled Treatment:    Reason Eval/Treat Not Completed: Patient declined, no reason specified (Patient declining on several attempts saying he is not ready to work with therapy. OT to attempt later today as schedule permits.) Alfonse Flavors, OTA Acute Rehabilitation Services  Office [phone]  Dewain Penning 11/17/2023, 11:10 AM

## 2023-11-17 NOTE — Progress Notes (Signed)
 PT Cancellation Note  Patient Details Name: [name] MRN: [id number] DOB: 07/26/1981   Cancelled Treatment:    Reason Eval/Treat Not Completed: Patient declined, no reason specified  Van Clines, PT  Acute Rehabilitation Services Office [phone] Secure Chat welcomed    [name] 11/17/2023, 11:07 AM

## 2023-11-18 LAB — GLUCOSE, CAPILLARY
Glucose-Capillary: 123 mg/dL — ABNORMAL HIGH (ref 70–99)
Glucose-Capillary: 121 mg/dL — ABNORMAL HIGH (ref 70–99)
Glucose-Capillary: 186 mg/dL — ABNORMAL HIGH (ref 70–99)
Glucose-Capillary: 103 mg/dL — ABNORMAL HIGH (ref 70–99)

## 2023-11-18 MED ORDER — HEPARIN SODIUM (PORCINE) 1000 UNIT/ML IJ SOLN
INTRAMUSCULAR | Status: AC
  Administered 2023-11-18: 4200 [IU]
  Filled 2023-11-18: qty 5

## 2023-11-18 NOTE — Progress Notes (Signed)
 [facility]                                                                              [name], is a 43 y.o. male, DOB - Dec 29, 1980, NWG:[id number] Admit date - 09/09/2023    Outpatient Primary MD for the patient is [name], NP  LOS - 70  days  No chief complaint on file.      Brief summary   Patient is a 43 y.o. 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  While at [facility] he underwent cardiac cath showing non obstructive disease, craniotomy with abscess drainage on 09/01/23,. He had subarachnoid hemorrhage while on IV heparin, right third toe distal necrosis,was seen by podiatry, but family wanted conservative management. Since his tertiary care needs have been completed, he was transferred back to [facility] for further management.   PTOT  recommending SNF and awaiting placement.   Awaiting placement  Assessment & Plan    MRSA BACTEREMIA WITH TV/MV endocarditis - resolved - Positive MRSA blood cultures. Diagnosed on TEE, managed on Vancomycin. -Underwent left heart catheterization at  [facility] which was significant for nonobstructive disease.  -ID recommended to continue IV vancomycin, patient has completed 8-week treatment course on 10/27/2023 -No acute complaints   Prostate fluid collection/abscess-resolved -Seen by [facility] urology, per their assessment, fluid collection was secondary to dilated seminal vesicle with no surgical intervention recommended.  -Infectious disease with continued concern for possible abscess, received IV vancomycin with recommendation for outpatient  urology follow-up ?  [facility]. 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  -Patient was evaluated by neurosurgery at [facility], status post craniotomy on 09/01/23 for abscess drainage.   -Repeat brain MRI 10/17/2023 showed improvement   ESRD on HD, TTS -Nephrology following -Undergoing HD per schedule, TTS   Hyperkalemia -Improving with HD   Anemia of chronic kidney disease -H&H stable, 10.4 on 2/19    CKD mineral bone disease -Continue calcitriol   Acute CVA -MRI confirms large right MCA branch infarct. - MRA head (11/25) significant for large necrotic appearing lesion within the right frontal lobe within area of the prior right MCA territory infarct with vasogenic edema and 8 mm leftward midline shift. - LDL of 34. Hemoglobin A1C of 8.9%. -2D echo 10/13 showed LVEF of 25-30% with concern for AV/MV abnormality -Transesophageal Echocardiogram (10/16 & 10/22) significant for MV/TV vegetations.  -Neurology recommendations for Eliquis.  -PT/OT recommendations for SNF.   -Patient with significant left upper extremity paresis. -Repeat MRI brain on 1/19 showed decreased area of abnormal diffusion restriction within the posterior right frontal operculum compatible with reported abscess, amount of surrounding edema has also decreased.   Primary hypertension -Patient is on metoprolol as an outpatient. Metoprolol held secondary to hypotension with  dialysis.   Acute left upper extremity DVT -Continue Eliquis   Right third toe necrosis - Per documentation, patient/family opting for conservative management and decline surgical management.  - Dr Sharlene Dory discussed with patient's mother on 2/11 who brought up the idea of podiatry, however if no surgical intervention is going to be pursued at this time, likely may not be of benefit. -Does not appear to be acutely infected, continue conservative management at this time.  If infected or  necrosis spreads, will consult podiatry or orthopedic surgery   Diabetes mellitus type 1 - Poorly controlled with hyperglycemia and hypoglycemia, likely due to poor dietary choices.  - Hemoglobin A1C of 8.9%.  CBG (last 3)  Recent Labs    11/17/23 2132 11/18/23 0734 11/18/23 1228  GLUCAP 163* 123* 121*   -Continue SSI, Semglee 6 units daily   Gastroparesis -No acute issues.  Cont glycemic control   History of left AKA -Noted. Patient seen by PT/OT with recommendation for SNF on discharge.   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 Patient started on Seroquel for behavioral change management.  Appears to be improving.  Patient very conversant, A&O x 4.   Pressure injury -Left buttock, not present on admission  -Wound care per nursing   Constipation -Refuses suppositories, enema, continue Colace, MiraLAX   Obesity class II Estimated body mass index is 32.77 kg/m as calculated from the following:   Height as of this encounter: 5\' 7"  (1.702 m).   Weight as of this encounter: 94.9 kg.  Code Status: full  DVT Prophylaxis:   apixaban (ELIQUIS) tablet 5 mg   Level of Care: Level of care: Telemetry Medical Family Communication: Updated patient Disposition Plan:      Remains inpatient appropriate: Currently awaiting placement, will need outpatient HD center as well.  Denied by [facility]  Consultants:   ID Nephrology   Antimicrobials:   Anti-infectives (From admission, onward)    Start     Dose/Rate Route Frequency Ordered Stop   10/21/23 1230  vancomycin (VANCOCIN) 750 mg in sodium chloride 0.9 % 250 mL IVPB  Status:  Discontinued        750 mg 265 mL/hr over 60 Minutes Intravenous Every T-Th-Sa (Hemodialysis) 10/21/23 0804 10/21/23 0811   10/21/23 1200  vancomycin (VANCOCIN) 750 mg in sodium chloride 0.9 % 250 mL IVPB        750 mg 250 mL/hr over 60 Minutes Intravenous Every T-Th-Sa (Hemodialysis) 10/21/23 0811 10/27/23 1413   10/16/23 1345  vancomycin  (VANCOCIN) 750 mg in sodium chloride 0.9 % 250 mL IVPB  Status:  Discontinued        750 mg 265 mL/hr over 60 Minutes Intravenous Every T-Th-Sa (Hemodialysis) 10/16/23 1257 10/21/23 0804   10/16/23 1200  vancomycin (VANCOREADY) IVPB 750 mg/150 mL  Status:  Discontinued        750 mg 150 mL/hr over 60 Minutes Intravenous Every T-Th-Sa (Hemodialysis) 10/14/23 1843 10/16/23 1257   10/14/23 1845  vancomycin (VANCOREADY) IVPB 500 mg/100 mL        500 mg 100 mL/hr over 60 Minutes Intravenous  Once 10/14/23 1843 10/15/23 0806   10/14/23 1200  vancomycin (VANCOREADY) IVPB 500 mg/100 mL  Status:  Discontinued        500 mg 100 mL/hr over 60 Minutes Intravenous Every T-Th-Sa (Hemodialysis) 10/13/23 1309 10/14/23 1843   10/13/23 1200  vancomycin (VANCOREADY) IVPB 500 mg/100 mL  Status:  Discontinued        500 mg 100 mL/hr over 60  Minutes Intravenous Every M-W-F (Hemodialysis) 10/11/23 1517 10/13/23 1309   10/12/23 1200  vancomycin (VANCOREADY) IVPB 500 mg/100 mL  Status:  Discontinued        500 mg 100 mL/hr over 60 Minutes Intravenous Every T-Th-Sa (Hemodialysis) 10/11/23 1514 10/11/23 1517   09/24/23 1200  vancomycin (VANCOREADY) IVPB 500 mg/100 mL  Status:  Discontinued        500 mg 100 mL/hr over 60 Minutes Intravenous Every M-W-F (Hemodialysis) 09/21/23 1246 10/11/23 1514   09/20/23 1200  vancomycin (VANCOCIN) 750 mg in sodium chloride 0.9 % 250 mL IVPB  Status:  Discontinued        750 mg 265 mL/hr over 60 Minutes Intravenous Every M-W-F (Hemodialysis) 09/20/23 0446 09/21/23 1246   09/20/23 1045  vancomycin (VANCOCIN) 750 mg in sodium chloride 0.9 % 250 mL IVPB        750 mg 265 mL/hr over 60 Minutes Intravenous  Once 09/20/23 0959 09/20/23 1226   09/20/23 0945  vancomycin (VANCOREADY) IVPB 750 mg/150 mL  Status:  Discontinued        750 mg 150 mL/hr over 60 Minutes Intravenous  Once 09/20/23 0855 09/20/23 0959   09/10/23 1200  vancomycin (VANCOREADY) IVPB 750 mg/150 mL  Status:   Discontinued        750 mg 150 mL/hr over 60 Minutes Intravenous Every M-W-F (Hemodialysis) 09/09/23 2030 09/20/23 0446   09/09/23 2200  linezolid (ZYVOX) tablet 600 mg  Status:  Discontinued        600 mg Oral Every 12 hours 09/09/23 2035 09/10/23 0946          Medications  (feeding supplement) PROSource Plus  30 mL Oral BID BM   apixaban  5 mg Oral BID   bacitracin   Topical Daily   Chlorhexidine Gluconate Cloth  6 each Topical Q0600   darbepoetin (ARANESP) injection - DIALYSIS  150 mcg Subcutaneous Q Sun-1800   docusate sodium  100 mg Oral BID   gabapentin  100 mg Oral TID   Gerhardt's butt cream   Topical BID   hydrocortisone  25 mg Rectal BID   insulin aspart  0-5 Units Subcutaneous QHS   insulin aspart  0-9 Units Subcutaneous TID WC   insulin glargine-yfgn  6 Units Subcutaneous Daily   lactulose  20 g Oral Once   loratadine  10 mg Oral Daily   midodrine  10 mg Oral Q T,Th,Sa-HD   pantoprazole  40 mg Oral Daily   polyethylene glycol  17 g Oral Daily   povidone-Iodine   Topical Q0600   QUEtiapine  12.5 mg Oral q morning   QUEtiapine  25 mg Oral QHS   senna-docusate  2 tablet Oral BID   sevelamer carbonate  2,400 mg Oral TID WC   sodium zirconium cyclosilicate  10 g Oral TID      Subjective:   [name] was seen and examined today.  Seen during hemodialysis.  Somewhat upset that he has been declined by [facility] and wants to talk to [facility].  Otherwise no acute issues. Objective:   Vitals:   11/18/23 1130 11/18/23 1141 11/18/23 1144 11/18/23 1226  BP: 109/80 110/78 (!) 131/95 (!) 103/57  Pulse: (!) 109 (!) 106 (!) 109 (!) 114  Resp: 13 (!) 9 (!) 9   Temp:  (!) 97.3 F (36.3 C)    TempSrc:      SpO2: 95% 92% 96%   Weight:      Height:  Intake/Output Summary (Last 24 hours) at 11/18/2023 1346 Last data filed at 11/18/2023 1144 Gross per 24 hour  Intake --  Output 3 ml  Net -3 ml     Wt Readings from Last 3 Encounters:  11/18/23 94.9 kg  08/24/23 78  kg  04/14/23 (S) 77.6 kg   Physical Exam General: Alert and oriented x 3, NAD, upset Cardiovascular: S1 S2 clear, RRR.  Respiratory: CTAB, no wheezing, rales or rhonchi Gastrointestinal: Soft, nontender, nondistended, NBS Ext: left AKA Neuro: LUE flaccid Psych: frustrated upset      Data Reviewed:  I have personally reviewed following labs    CBC Lab Results  Component Value Date   WBC 3.5 (L) 11/17/2023   RBC 3.78 (L) 11/17/2023   HGB 10.4 (L) 11/17/2023   HCT 35.1 (L) 11/17/2023   MCV 92.9 11/17/2023   MCH 27.5 11/17/2023   PLT 115 (L) 11/17/2023   MCHC 29.6 (L) 11/17/2023   RDW 16.8 (H) 11/17/2023   LYMPHSABS 1.0 11/10/2023   MONOABS 0.3 11/10/2023   EOSABS 0.3 11/10/2023   BASOSABS 0.0 11/10/2023     Last metabolic panel Lab Results  Component Value Date   NA 134 (L) 11/17/2023   K 5.3 (H) 11/17/2023   CL 96 (L) 11/17/2023   CO2 25 11/17/2023   BUN 34 (H) 11/17/2023   CREATININE 5.38 (H) 11/17/2023   GLUCOSE 94 11/17/2023   GFRNONAA 13 (L) 11/17/2023   GFRAA 7 (L) 05/30/2020   CALCIUM 9.4 11/17/2023   PHOS 7.6 (H) 11/16/2023   PROT 7.7 10/19/2023   PROT 7.8 10/19/2023   ALBUMIN 3.6 11/10/2023   BILITOT 1.3 (H) 10/19/2023   BILITOT 1.3 (H) 10/19/2023   ALKPHOS 150 (H) 10/19/2023   ALKPHOS 150 (H) 10/19/2023   AST 20 10/19/2023   AST 19 10/19/2023   ALT 10 10/19/2023   ALT 8 10/19/2023   ANIONGAP 13 11/17/2023    CBG (last 3)  Recent Labs    11/17/23 2132 11/18/23 0734 11/18/23 1228  GLUCAP 163* 123* 121*      Coagulation Profile: No results for input(s): "INR", "PROTIME" in the last 168 hours.   Radiology Studies: I have personally reviewed the imaging studies  No results found.     Thad Ranger M.D. [facility] 11/18/2023, 1:46 PM  Available via Epic secure chat 7am-7pm After 7 pm, please refer to night coverage provider listed on amion.

## 2023-11-18 NOTE — Progress Notes (Signed)
 [facility] Progress Note   Subjective: Seen on HD. Attempting 3 liters.      Objective Vitals:   11/18/23 0512 11/18/23 0753 11/18/23 0805 11/18/23 0830  BP: (!) 121/92 (!) 69/50 (!) 139/100 136/85  Pulse: (!) 114 (!) 114 (!) 112 (!) 116  Resp: 18 16 (!) 22 12  Temp: 98.2 F (36.8 C) 97.8 F (36.6 C)    TempSrc:      SpO2: 95% 95% 93% 95%  Weight:  94.9 kg    Height:       General: Alert, chronically ill male NAD Heart: RRR no MRG Lungs: CTA bilaterally RA, unlabored Abdomen: NABS, soft NT ND, trace abdominal edema Extremities: Left AKA no stump edema, RLE no edema. LUE edematous Dialysis Access: L FEM TDC dressing dry clear    Additional Objective Labs: Basic Metabolic Panel: Recent Labs  Lab 11/16/23 1417 11/17/23 0749  NA 133* 134*  K 5.6* 5.3*  CL 93* 96*  CO2 26 25  GLUCOSE 360* 94  BUN 58* 34*  CREATININE 8.19* 5.38*  CALCIUM 9.3 9.4  PHOS 7.6*  --    Liver Function Tests: No results for input(s): "AST", "ALT", "ALKPHOS", "BILITOT", "PROT", "ALBUMIN" in the last 168 hours. No results for input(s): "LIPASE", "AMYLASE" in the last 168 hours. CBC: Recent Labs  Lab 11/16/23 1417 11/17/23 0749  WBC 3.9* 3.5*  HGB 9.4* 10.4*  HCT 32.5* 35.1*  MCV 94.5 92.9  PLT 127* 115*   Blood Culture    Component Value Date/Time   SDES BLOOD SITE NOT SPECIFIED 08/25/2023 1706   SPECREQUEST  08/25/2023 1706    BOTTLES DRAWN AEROBIC ONLY Blood Culture results may not be optimal due to an inadequate volume of blood received in culture bottles   CULT  08/25/2023 1706    NO GROWTH 5 DAYS Performed at [facility], [address], [place], [name] [address]    REPTSTATUS 08/30/2023 FINAL 08/25/2023 1706    Cardiac Enzymes: No results for input(s): "CKTOTAL", "CKMB", "CKMBINDEX", "TROPONINI" in the last 168 hours. CBG: Recent Labs  Lab 11/16/23 2321 11/17/23 1310 11/17/23 1704 11/17/23 2132 11/18/23 0734  GLUCAP 151* 184* 237* 163*  123*   Iron Studies: No results for input(s): "IRON", "TIBC", "TRANSFERRIN", "FERRITIN" in the last 72 hours. @lablastinr3 @ Studies/Results: No results found. Medications:  iron sucrose Stopped (11/16/23 1753)    (feeding supplement) PROSource Plus  30 mL Oral BID BM   apixaban  5 mg Oral BID   bacitracin   Topical Daily   Chlorhexidine Gluconate Cloth  6 each Topical Q0600   darbepoetin (ARANESP) injection - DIALYSIS  150 mcg Subcutaneous Q Sun-1800   docusate sodium  100 mg Oral BID   gabapentin  100 mg Oral TID   Gerhardt's butt cream   Topical BID   hydrocortisone  25 mg Rectal BID   insulin aspart  0-5 Units Subcutaneous QHS   insulin aspart  0-9 Units Subcutaneous TID WC   insulin glargine-yfgn  6 Units Subcutaneous Daily   lactulose  20 g Oral Once   loratadine  10 mg Oral Daily   midodrine  10 mg Oral Q T,Th,Sa-HD   pantoprazole  40 mg Oral Daily   polyethylene glycol  17 g Oral Daily   povidone-Iodine   Topical Q0600   QUEtiapine  12.5 mg Oral q morning   QUEtiapine  25 mg Oral QHS   senna-docusate  2 tablet Oral BID   sevelamer carbonate  2,400  mg Oral TID WC   sodium zirconium cyclosilicate  10 g Oral TID     Dialysis Orders:   MWF - [facility] -> may be will be going to [place] TTS on discharge 4hr, 400/A1.5, EDW 77kg, L femoral TDC, 2K/3Ca bath, no heparin   Assessment/Plan: MRSA bacteremia with MV/TV endocarditis + brain abscess:  Not a candidate for valve surgery, S/p line holiday and brain abscess drainage (at [facility]). Completed on extensive IV Vanc course on 1/29. S/p transfer to [facility] and drainage 08/31/24. Repeat brain MRI 10/17/23 showed improvement. Acute B CVA/septic emboli- due to above Acute LUE DVT: On Eliquis ESRD: Transitioned to TTS. Next HD 11/20/2023 HTN/volume: CT 10/09/23 with diffuse anasarca. Significantly above EDW but using bed weights. Max UF with HD. On Midodrine with HD and okay to use IV Albumin and extra Midodrine dose  PRN. Enforcing fluid restrictions/compliance with full HD time.  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 On Aranesp to . Iron studies from 2/6 include: Iron 31, Tsat 15%, and Ferritin 140. On Fe load X 10 doses with HD-1st dose 2/8. Secondary HPTH:  CCA ^, Continue to hold calcitriol for now. Sevelamer  ha been ^ to 3/meals.  Continues to have outside food and refuses any diet except regular (chronic problem with him despite counseling).phos 6.3 . Hyperkalemia: K+ 4.5, now stable. Continue Lokelma 10g TID for now, using low bath intermittently with HD.  As noted above diet indiscretion despite counseling. Checking labs in AM. Prostate fluid collection/abscess = noted per admit team urology consulted at outside hospital per their assessment "fluid collection was 2/2 dilated seminal vesicle with no surg.  Intervention recommended/ID concern for possible abscess patient covered with Vanc. and recommendation for outpatient urology follow-up" Nutrition: Alb 3.4,  continue supplements.  T1DM: per primary Dispo: Insurance denied [facility]. Continue search for SNF  Ermal Brzozowski H. Aleshia Cartelli NP-C 11/18/2023, 8:45 AM  [employer] [phone]

## 2023-11-18 NOTE — Progress Notes (Signed)
   11/18/23 1144  Vitals  Pulse Rate (!) 109  Resp (!) 9  BP (!) 131/95  SpO2 96 %  O2 Device Room Air  Oxygen Therapy  Patient Activity (if Appropriate) In bed  Pulse Oximetry Type Continuous  Post Treatment  Liters Processed 73.5  Fluid Removed (mL) 3 mL  Tolerated HD Treatment Yes   Received patient in bed to unit.  Alert and oriented.  Informed consent signed and in chart.   TX duration:3.5 hours  Patient tolerated well.  Transported back to the room  Alert, without acute distress.  Hand-off given to patient's nurse.   Access used: R fem CVC Access issues: None  Total UF removed: Medication(s) given: See MAR    Laqueta Due, RN [facility]

## 2023-11-18 NOTE — Plan of Care (Signed)
  Problem: Clinical Measurements: Goal: Ability to maintain clinical measurements within normal limits will improve Outcome: Progressing Goal: Will remain free from infection Outcome: Progressing Goal: Cardiovascular complication will be avoided Outcome: Progressing   Problem: Activity: Goal: Risk for activity intolerance will decrease Outcome: Progressing   Problem: Coping: Goal: Level of anxiety will decrease Outcome: Progressing   Problem: Skin Integrity: Goal: Risk for impaired skin integrity will decrease Outcome: Progressing   Problem: Education: Goal: Ability to describe self-care measures that may prevent or decrease complications (Diabetes Survival Skills Education) will improve Outcome: Progressing Goal: Individualized Educational Video(s) Outcome: Progressing   Problem: Coping: Goal: Ability to adjust to condition or change in health will improve Outcome: Progressing   Problem: Fluid Volume: Goal: Ability to maintain a balanced intake and output will improve Outcome: Progressing   Problem: Health Behavior/Discharge Planning: Goal: Ability to identify and utilize available resources and services will improve Outcome: Progressing Goal: Ability to manage health-related needs will improve Outcome: Progressing   Problem: Metabolic: Goal: Ability to maintain appropriate glucose levels will improve Outcome: Progressing   Problem: Skin Integrity: Goal: Risk for impaired skin integrity will decrease Outcome: Progressing   Problem: Tissue Perfusion: Goal: Adequacy of tissue perfusion will improve Outcome: Progressing   Problem: Education: Goal: Knowledge of disease and its progression will improve Outcome: Progressing Goal: Individualized Educational Video(s) Outcome: Progressing   Problem: Fluid Volume: Goal: Compliance with measures to maintain balanced fluid volume will improve Outcome: Progressing   Problem: Health Behavior/Discharge Planning: Goal:  Ability to manage health-related needs will improve Outcome: Progressing   Problem: Nutritional: Goal: Ability to make healthy dietary choices will improve Outcome: Progressing   Problem: Clinical Measurements: Goal: Complications related to the disease process, condition or treatment will be avoided or minimized Outcome: Progressing   Problem: Clinical Measurements: Goal: Will remain free from infection Outcome: Progressing

## 2023-11-19 LAB — GLUCOSE, CAPILLARY
Glucose-Capillary: 105 mg/dL — ABNORMAL HIGH (ref 70–99)
Glucose-Capillary: 176 mg/dL — ABNORMAL HIGH (ref 70–99)
Glucose-Capillary: 215 mg/dL — ABNORMAL HIGH (ref 70–99)
Glucose-Capillary: 263 mg/dL — ABNORMAL HIGH (ref 70–99)

## 2023-11-19 MED ORDER — CHLORHEXIDINE GLUCONATE CLOTH 2 % EX PADS
6.0000 | MEDICATED_PAD | Freq: Every day | CUTANEOUS | Status: DC
  Administered 2023-11-21 – 2023-11-24 (×4): 6 via TOPICAL

## 2023-11-19 NOTE — TOC Progression Note (Addendum)
 Transition of Care [facility]) - Progression Note    Patient Details  Name: KOLLEN ARMENTI MRN: [id number] Date of Birth: [birth year]  Transition of [facility]) CM/SW Contact  Dellie Burns [place], [name] Phone Number: 11/19/2023, 9:47 AM  Clinical Narrative:  Pt continues to have no SNF bed offers. Barriers to SNF include narcotic use, documented refusal of care at times, HD, [employer]. Will continue efforts and provide updates as available.   UPDATE 1245: Re-sent SNF referrals and pt has received offers. Provided current offers to pt and he has accepted [facility]. Confirmed bed with Wille Celeste at [facility]. Renal Navigator aware of facility choice. CMA to start [facility] auth.  UPDATE 1430: Drucie Opitz received [phone], valid 2/21-2/27) and details provided to Rayle at [facility]. Per Wille Celeste, they are only able to accommodate transportation for HD to [place] on [address] or [facility] on [address]. Renal Navigator updated and is working on clinic.   Dellie Burns, MSW, LCSW [phone] (coverage)      Expected Discharge Plan: Skilled Nursing Facility Barriers to Discharge: Continued Medical Work up  Expected Discharge Plan and Services                                               Social Determinants of Health (SDOH) Interventions SDOH Screenings   Food Insecurity: No Food Insecurity (09/09/2023)  Housing: Unknown (09/09/2023)  Transportation Needs: No Transportation Needs (09/09/2023)  Utilities: Not At Risk (09/09/2023)  Alcohol Screen: Low Risk  (02/15/2023)  Depression (PHQ2-9): Low Risk  (02/15/2023)  Financial Resource Strain: Low Risk  (02/15/2023)  Physical Activity: Inactive (02/15/2023)  Social Connections: Socially Integrated (02/15/2023)  Stress: No Stress Concern Present (02/15/2023)  Tobacco Use: Low Risk  (09/12/2023)  Recent Concern: Tobacco Use - Medium Risk (09/01/2023)   Received from [facility]    Readmission  Risk Interventions     No data to display

## 2023-11-19 NOTE — Plan of Care (Signed)
  Problem: Clinical Measurements: Goal: Ability to maintain clinical measurements within normal limits will improve Outcome: Progressing Goal: Will remain free from infection Outcome: Progressing Goal: Cardiovascular complication will be avoided Outcome: Progressing   Problem: Activity: Goal: Risk for activity intolerance will decrease Outcome: Progressing   Problem: Coping: Goal: Level of anxiety will decrease Outcome: Progressing   Problem: Skin Integrity: Goal: Risk for impaired skin integrity will decrease Outcome: Progressing   Problem: Education: Goal: Ability to describe self-care measures that may prevent or decrease complications (Diabetes Survival Skills Education) will improve Outcome: Progressing Goal: Individualized Educational Video(s) Outcome: Progressing   Problem: Coping: Goal: Ability to adjust to condition or change in health will improve Outcome: Progressing   Problem: Fluid Volume: Goal: Ability to maintain a balanced intake and output will improve Outcome: Progressing   Problem: Health Behavior/Discharge Planning: Goal: Ability to identify and utilize available resources and services will improve Outcome: Progressing Goal: Ability to manage health-related needs will improve Outcome: Progressing   Problem: Metabolic: Goal: Ability to maintain appropriate glucose levels will improve Outcome: Progressing   Problem: Skin Integrity: Goal: Risk for impaired skin integrity will decrease Outcome: Progressing   Problem: Tissue Perfusion: Goal: Adequacy of tissue perfusion will improve Outcome: Progressing   Problem: Education: Goal: Knowledge of disease and its progression will improve Outcome: Progressing Goal: Individualized Educational Video(s) Outcome: Progressing   Problem: Fluid Volume: Goal: Compliance with measures to maintain balanced fluid volume will improve Outcome: Progressing   Problem: Health Behavior/Discharge Planning: Goal:  Ability to manage health-related needs will improve Outcome: Progressing   Problem: Nutritional: Goal: Ability to make healthy dietary choices will improve Outcome: Progressing   Problem: Clinical Measurements: Goal: Complications related to the disease process, condition or treatment will be avoided or minimized Outcome: Progressing   Problem: Clinical Measurements: Goal: Will remain free from infection Outcome: Progressing

## 2023-11-19 NOTE — Progress Notes (Signed)
 MEWS Progress Note  Patient Details Name: [name] MRN: [id number] DOB: Oct 30, 1980 Today's Date: 11/19/2023   MEWS Flowsheet Documentation:  Assess: MEWS Score Temp: 98 F (36.7 C) BP: (!) 145/105 MAP (mmHg): 118 Pulse Rate: (!) 113 ECG Heart Rate: (!) 115 Resp: 20 Level of Consciousness: Alert SpO2: 97 % O2 Device: Room Air Patient Activity (if Appropriate): In bed O2 Flow Rate (L/min): 2 L/min FiO2 (%): 21 % Assess: MEWS Score MEWS Temp: 0 MEWS Systolic: 0 MEWS Pulse: 2 MEWS RR: 0 MEWS LOC: 0 MEWS Score: 2 MEWS Score Color: Yellow Assess: SIRS CRITERIA SIRS Temperature : 0 SIRS Respirations : 0 SIRS Pulse: 1 SIRS WBC: 0 SIRS Score Sum : 1 Assess: if the MEWS score is Yellow or Red Were vital signs accurate and taken at a resting state?: Yes Does the patient meet 2 or more of the SIRS criteria?: No Does the patient have a confirmed or suspected source of infection?: No MEWS guidelines implemented : No, previously yellow, continue vital signs every 4 hours        Denton Meek 11/19/2023, 10:07 PM

## 2023-11-19 NOTE — Progress Notes (Signed)
 PROGRESS NOTE    [name]  ZOX:[id number] DOB: November 25, 1980 DOA: 09/09/2023 PCP: [name], NP   Brief Narrative:  Patient is a 43 y.o. male with ESRD on hemodialysis (not fully compliant), insulin-dependent type 1 diabetes, gastroparesis, Left AKA, admitted to [facility] on 07/09/23 for DKA, Sepsis and hospital course significant for MV AND TV  MRSA endocarditis, followed by acute MCA stroke , left occipital and bilateral cerebellar strokes consistent with septic emboli,  Brain abscess from MRSA, left IJ DVT was transferred to [facility] for MV repair on 11/29.  While at [facility] he underwent cardiac cath showing non obstructive disease, craniotomy with abscess drainage on 09/01/23,. He had subarachnoid hemorrhage while on IV heparin, right third toe distal necrosis,was seen by podiatry, but family wanted conservative management. Since his tertiary care needs have been completed, he was transferred back to [facility] for further management.   PTOT  recommending SNF and awaiting placement.   Assessment & Plan:   Principal Problem:   Endocarditis Active Problems:   MRSA bacteremia   Endocarditis of tricuspid valve   ESRD on dialysis ([name])   Cognitive impairment   Insulin dependent type 1 diabetes mellitus ([name])   Diabetic gastroparesis ([name])   Diabetic foot infection ([name])   Endocarditis of mitral valve   Cerebrovascular accident (CVA) due to embolism of cerebral artery ([name])  MRSA BACTEREMIA WITH TV/MV endocarditis - resolved - Positive MRSA blood cultures. Diagnosed on TEE, managed on Vancomycin. -Underwent left heart catheterization at  [facility] which was significant for nonobstructive disease.  -ID recommended to continue IV vancomycin, patient has completed 8-week treatment course on 10/27/2023 -No acute complaints   Prostate fluid collection/abscess-resolved -Seen by [facility] urology, per their assessment, fluid collection was secondary to dilated seminal vesicle with no surgical  intervention recommended.  -Infectious disease with continued concern for possible abscess, received IV vancomycin with recommendation for outpatient urology follow-up ?  [facility]. 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  -Patient was evaluated by neurosurgery at [facility], status post craniotomy on 09/01/23 for abscess drainage.   -Repeat brain MRI 10/17/2023 showed improvement   ESRD on HD, TTS -Nephrology following -Undergoing HD per schedule, TTS   Hyperkalemia -Being treated with Lokelma 3 times daily and HD per schedule.   Anemia of chronic kidney disease -H&H stable, 10.4 on 2/19   CKD mineral bone disease -Continue calcitriol   Acute CVA -MRI confirms large right MCA branch infarct. - MRA head (11/25) significant for large necrotic appearing lesion within the right frontal lobe within area of the prior right MCA territory infarct with vasogenic edema and 8 mm leftward midline shift. - LDL of 34. Hemoglobin A1C of 8.9%. -2D echo 10/13 showed LVEF of 25-30% with concern for AV/MV abnormality -Transesophageal Echocardiogram (10/16 & 10/22) significant for MV/TV vegetations.  -Neurology recommendations for Eliquis.  -PT/OT recommendations for SNF.   -Patient with significant left upper extremity paresis. -Repeat MRI brain on 1/19 showed decreased area of abnormal diffusion restriction within the posterior right frontal operculum compatible with reported abscess, amount of surrounding edema has also decreased.   Primary hypertension -Patient is on metoprolol as an outpatient. Metoprolol held secondary to hypotension with dialysis.  Patient has been started on midodrine.   Acute left upper extremity DVT -Continue Eliquis   Right third toe necrosis - Per documentation, patient/family opting for conservative management and decline surgical management.  - Dr Sharlene Dory discussed with patient's mother on 2/11  who brought up  the idea of podiatry, however if no surgical intervention is going to be pursued at this time, likely may not be of benefit. -Does not appear to be acutely infected, continue conservative management at this time.  If infected or necrosis spreads, will consult podiatry or orthopedic surgery   Diabetes mellitus type 1 - Poorly controlled with hyperglycemia and hypoglycemia, likely due to poor dietary choices.  - Hemoglobin A1C of 8.9%.  Continue SSI, Semglee 6 units daily   Gastroparesis -No acute issues.  Cont glycemic control    History of left AKA -Noted. Patient seen by PT/OT with recommendation for SNF on discharge.   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 Patient started on Seroquel for behavioral change management.  Appears to be improving.  Patient very conversant, A&O x 4.   Pressure injury -Left buttock, not present on admission  -Wound care per nursing    Constipation -Refuses suppositories, enema, continue Colace, MiraLAX    Obesity class II Estimated body mass index is 32.77 kg/m as calculated from the following:   Height as of this encounter: 5\' 7"  (1.702 m).   Weight as of this encounter: 94.9 kg.  DVT prophylaxis: eliquis   Code Status: Full Code  Family Communication:  None present at bedside.  Plan of care discussed with patient in length and he/she verbalized understanding and agreed with it.  Status is: Inpatient Remains inpatient appropriate because: Pending placement   Estimated body mass index is 32.77 kg/m as calculated from the following:   Height as of this encounter: 5\' 7"  (1.702 m).   Weight as of this encounter: 94.9 kg.  Pressure Injury 08/03/23 Buttocks Left Stage 2 -  Partial thickness loss of dermis presenting as a shallow open injury with a red, pink wound bed without slough. Several small open areas, right buttocks area (Active)  08/03/23 1945  Location: Buttocks  Location Orientation: Left  Staging: Stage 2 -  Partial  thickness loss of dermis presenting as a shallow open injury with a red, pink wound bed without slough.  Wound Description (Comments): Several small open areas, right buttocks area  Present on Admission: No (present on transfer to [facility])  Dressing Type None 11/18/23 0100   Nutritional Assessment: Body mass index is 32.77 kg/m.Marland Kitchen Seen by dietician.  I agree with the assessment and plan as outlined below: Nutrition Status:        . Skin Assessment: I have examined the patient's skin and I agree with the wound assessment as performed by the wound care RN as outlined below: Pressure Injury 08/03/23 Buttocks Left Stage 2 -  Partial thickness loss of dermis presenting as a shallow open injury with a red, pink wound bed without slough. Several small open areas, right buttocks area (Active)  08/03/23 1945  Location: Buttocks  Location Orientation: Left  Staging: Stage 2 -  Partial thickness loss of dermis presenting as a shallow open injury with a red, pink wound bed without slough.  Wound Description (Comments): Several small open areas, right buttocks area  Present on Admission: No (present on transfer to [facility])  Dressing Type None 11/18/23 0100    Consultants:  Nephrology  Procedures:  As above  Antimicrobials:  Anti-infectives (From admission, onward)    Start     Dose/Rate Route Frequency Ordered Stop   10/21/23 1230  vancomycin (VANCOCIN) 750 mg in sodium chloride 0.9 % 250 mL IVPB  Status:  Discontinued        750 mg  265 mL/hr over 60 Minutes Intravenous Every T-Th-Sa (Hemodialysis) 10/21/23 0804 10/21/23 0811   10/21/23 1200  vancomycin (VANCOCIN) 750 mg in sodium chloride 0.9 % 250 mL IVPB        750 mg 250 mL/hr over 60 Minutes Intravenous Every T-Th-Sa (Hemodialysis) 10/21/23 0811 10/27/23 1413   10/16/23 1345  vancomycin (VANCOCIN) 750 mg in sodium chloride 0.9 % 250 mL IVPB  Status:  Discontinued        750 mg 265 mL/hr over 60 Minutes Intravenous Every T-Th-Sa  (Hemodialysis) 10/16/23 1257 10/21/23 0804   10/16/23 1200  vancomycin (VANCOREADY) IVPB 750 mg/150 mL  Status:  Discontinued        750 mg 150 mL/hr over 60 Minutes Intravenous Every T-Th-Sa (Hemodialysis) 10/14/23 1843 10/16/23 1257   10/14/23 1845  vancomycin (VANCOREADY) IVPB 500 mg/100 mL        500 mg 100 mL/hr over 60 Minutes Intravenous  Once 10/14/23 1843 10/15/23 0806   10/14/23 1200  vancomycin (VANCOREADY) IVPB 500 mg/100 mL  Status:  Discontinued        500 mg 100 mL/hr over 60 Minutes Intravenous Every T-Th-Sa (Hemodialysis) 10/13/23 1309 10/14/23 1843   10/13/23 1200  vancomycin (VANCOREADY) IVPB 500 mg/100 mL  Status:  Discontinued        500 mg 100 mL/hr over 60 Minutes Intravenous Every M-W-F (Hemodialysis) 10/11/23 1517 10/13/23 1309   10/12/23 1200  vancomycin (VANCOREADY) IVPB 500 mg/100 mL  Status:  Discontinued        500 mg 100 mL/hr over 60 Minutes Intravenous Every T-Th-Sa (Hemodialysis) 10/11/23 1514 10/11/23 1517   09/24/23 1200  vancomycin (VANCOREADY) IVPB 500 mg/100 mL  Status:  Discontinued        500 mg 100 mL/hr over 60 Minutes Intravenous Every M-W-F (Hemodialysis) 09/21/23 1246 10/11/23 1514   09/20/23 1200  vancomycin (VANCOCIN) 750 mg in sodium chloride 0.9 % 250 mL IVPB  Status:  Discontinued        750 mg 265 mL/hr over 60 Minutes Intravenous Every M-W-F (Hemodialysis) 09/20/23 0446 09/21/23 1246   09/20/23 1045  vancomycin (VANCOCIN) 750 mg in sodium chloride 0.9 % 250 mL IVPB        750 mg 265 mL/hr over 60 Minutes Intravenous  Once 09/20/23 0959 09/20/23 1226   09/20/23 0945  vancomycin (VANCOREADY) IVPB 750 mg/150 mL  Status:  Discontinued        750 mg 150 mL/hr over 60 Minutes Intravenous  Once 09/20/23 0855 09/20/23 0959   09/10/23 1200  vancomycin (VANCOREADY) IVPB 750 mg/150 mL  Status:  Discontinued        750 mg 150 mL/hr over 60 Minutes Intravenous Every M-W-F (Hemodialysis) 09/09/23 2030 09/20/23 0446   09/09/23 2200  linezolid  (ZYVOX) tablet 600 mg  Status:  Discontinued        600 mg Oral Every 12 hours 09/09/23 2035 09/10/23 0946         Subjective: Patient seen and examined.  He is trying to explain to me that he has been having good conduct and has not been duration of over acting weird since he has been on the fifth floor.  He is furious for being in the hospital for so long.  He blames hospital and physician for his rejection to SNF and LTAC's.  I had a lengthy discussion with him.  He says that he and his mother who often shows up in the evening is trying to get a hold of social  worker but no one talks to him.  I wonder if his mother is coming after 5 PM when social work closely.  I have resulted this to Child psychotherapist today.  Objective: Vitals:   11/18/23 1706 11/18/23 2200 11/19/23 0441 11/19/23 0730  BP: 131/85 (!) 132/92 (!) 131/96 (!) 185/129  Pulse: (!) 110 (!) 110 (!) 104 (!) 114  Resp: 18 18 18    Temp:  98.5 F (36.9 C) 98.2 F (36.8 C) 98.3 F (36.8 C)  TempSrc:    Oral  SpO2: 96% 94% 95% 99%  Weight:      Height:        Intake/Output Summary (Last 24 hours) at 11/19/2023 0756 Last data filed at 11/19/2023 0500 Gross per 24 hour  Intake --  Output 3 ml  Net -3 ml   Filed Weights   11/16/23 1424 11/16/23 1826 11/18/23 0753  Weight: 95.2 kg 94.2 kg 94.9 kg    Examination:  General exam: Appears calm and comfortable  Respiratory system: Clear to auscultation. Respiratory effort normal. Cardiovascular system: S1 & S2 heard, RRR. No JVD, murmurs, rubs, gallops or clicks. No pedal edema. Gastrointestinal system: Abdomen is nondistended, soft and nontender. No organomegaly or masses felt. Normal bowel sounds heard. Central nervous system: Alert and oriented. No focal neurological deficits. Extremities: Symmetric 5 x 5 power. Skin: No rashes, lesions or ulcers  Data Reviewed: I have personally reviewed following labs and imaging studies  CBC: Recent Labs  Lab 11/16/23 1417  11/17/23 0749  WBC 3.9* 3.5*  HGB 9.4* 10.4*  HCT 32.5* 35.1*  MCV 94.5 92.9  PLT 127* 115*   Basic Metabolic Panel: Recent Labs  Lab 11/16/23 1417 11/17/23 0749  NA 133* 134*  K 5.6* 5.3*  CL 93* 96*  CO2 26 25  GLUCOSE 360* 94  BUN 58* 34*  CREATININE 8.19* 5.38*  CALCIUM 9.3 9.4  PHOS 7.6*  --    GFR: Estimated Creatinine Clearance: 19.2 mL/min (A) (by C-G formula based on SCr of 5.38 mg/dL (H)). Liver Function Tests: No results for input(s): "AST", "ALT", "ALKPHOS", "BILITOT", "PROT", "ALBUMIN" in the last 168 hours. No results for input(s): "LIPASE", "AMYLASE" in the last 168 hours. No results for input(s): "AMMONIA" in the last 168 hours. Coagulation Profile: No results for input(s): "INR", "PROTIME" in the last 168 hours. Cardiac Enzymes: No results for input(s): "CKTOTAL", "CKMB", "CKMBINDEX", "TROPONINI" in the last 168 hours. BNP (last 3 results) No results for input(s): "PROBNP" in the last 8760 hours. HbA1C: No results for input(s): "HGBA1C" in the last 72 hours. CBG: Recent Labs  Lab 11/18/23 0734 11/18/23 1228 11/18/23 1704 11/18/23 2315 11/19/23 0737  GLUCAP 123* 121* 186* 103* 105*   Lipid Profile: No results for input(s): "CHOL", "HDL", "LDLCALC", "TRIG", "CHOLHDL", "LDLDIRECT" in the last 72 hours. Thyroid Function Tests: No results for input(s): "TSH", "T4TOTAL", "FREET4", "T3FREE", "THYROIDAB" in the last 72 hours. Anemia Panel: No results for input(s): "VITAMINB12", "FOLATE", "FERRITIN", "TIBC", "IRON", "RETICCTPCT" in the last 72 hours. Sepsis Labs: No results for input(s): "PROCALCITON", "LATICACIDVEN" in the last 168 hours.  No results found for this or any previous visit (from the past 240 hours).   Radiology Studies: No results found.  Scheduled Meds:  (feeding supplement) PROSource Plus  30 mL Oral BID BM   apixaban  5 mg Oral BID   bacitracin   Topical Daily   Chlorhexidine Gluconate Cloth  6 each Topical Q0600    darbepoetin (ARANESP) injection - DIALYSIS  150  mcg Subcutaneous Q Sun-1800   docusate sodium  100 mg Oral BID   gabapentin  100 mg Oral TID   Gerhardt's butt cream   Topical BID   hydrocortisone  25 mg Rectal BID   insulin aspart  0-5 Units Subcutaneous QHS   insulin aspart  0-9 Units Subcutaneous TID WC   insulin glargine-yfgn  6 Units Subcutaneous Daily   lactulose  20 g Oral Once   loratadine  10 mg Oral Daily   midodrine  10 mg Oral Q T,Th,Sa-HD   pantoprazole  40 mg Oral Daily   polyethylene glycol  17 g Oral Daily   povidone-Iodine   Topical Q0600   QUEtiapine  12.5 mg Oral q morning   QUEtiapine  25 mg Oral QHS   senna-docusate  2 tablet Oral BID   sevelamer carbonate  2,400 mg Oral TID WC   sodium zirconium cyclosilicate  10 g Oral TID   Continuous Infusions:  iron sucrose Stopped (11/18/23 1101)     LOS: 71 days   Hughie Closs, MD [facility]  11/19/2023, 7:56 AM   *Please note that this is a verbal dictation therefore any spelling or grammatical errors are due to the "Dragon Medical One" system interpretation.  Please page via Amion and do not message via secure chat for urgent patient care matters. Secure chat can be used for non urgent patient care matters.  How to contact the [facility] Attending or Consulting provider 7A - 7P or covering provider during after hours 7P -7A, for this patient?  Check the care team in [facility] and look for a) attending/consulting TRH provider listed and b) the [facility] team listed. Page or secure chat 7A-7P. Log into [email or web address] and use [facility] universal password to access. If you do not have the password, please contact the hospital operator. Locate the [facility] provider you are looking for under [facility] and page to a number that you can be directly reached. If you still have difficulty reaching the provider, please page the [facility] (Director on Call) for the Hospitalists listed on amion for assistance.

## 2023-11-19 NOTE — Progress Notes (Signed)
 Physical Therapy Treatment Patient Details Name: [name] MRN: [id number] DOB: 06-May-1981 Today's Date: 11/19/2023   History of Present Illness 43 y.o. male presents to [facility] 09/09/23 as a transfer from [facility] for further management of endocarditis. Pt originally was admitted to [facility] 07/09/23 for DKA and sepsis after being found on the ground. 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 10/17 pt had a R MCA, L occipital, and B cerebellum CVA w/ septic emboli. ICU admit 10/18 w/ intubation 10/18-10/24. Pt was transferred to [facility] for MV repair on 11/29. [facility], pt had cardiac cath w/ non obstructive disease, SAH, a craniotomy w/ abscess drainage on 12/4, and R third toe distal necrosis. PMH: chronic L AKA, MRSA, IDDM, ESRD on HD MWF, HTN, TEE positive for vegetation on multiple valves, cellulitis, medical noncompliance, narcotic dependence, DM with gastroparesis, L internal jugular DVT.    PT Comments  The pt was agreeable to session with focus on continued OOB transfer training. He continues to benefit from initial assistance to maintain seated balance, but improves throughout session to tolerate sitting with CGA only. He continues to benefit from cues for use of RUE and core to shift wt forwards and correct posterior lean. He demos slow but steady progress with OOB transfers, improved ability to use RLE and RUE to complete lateral scooting at this time. He also attempted single sit-stand, but was dependent on max-totalA of 2 (pt completing <25% of transfer) to complete small hip clearance from chair. Continue to recommend skilled PT in inpatient setting <3hours/day to maximize functional recovery, stability, and activity tolerance.    If plan is discharge home, recommend the following: Two people to help with walking and/or transfers;Assist for transportation;Supervision due to cognitive status;Help with stairs or ramp for entrance;Two people to help with  bathing/dressing/bathroom;Assistance with cooking/housework;Direct supervision/assist for financial management;Direct supervision/assist for medications management   Can travel by private vehicle     No  Equipment Recommendations  Hoyer lift;Wheelchair (measurements PT);Wheelchair cushion (measurements PT);Other (comment)    Recommendations for Other Services       Precautions / Restrictions Precautions Precautions: Fall Recall of Precautions/Restrictions: Intact Precaution/Restrictions Comments: L AKA, L hemiparesis and edema, L neglect, bowel incontinence. L femoral HD cath Restrictions Weight Bearing Restrictions Per Provider Order: No Other Position/Activity Restrictions: Pt has sling for LUE when mobilizing OOB; Avoid using L prosthesis on HD catheter     Mobility  Bed Mobility Overal bed mobility: Needs Assistance Bed Mobility: Rolling, Supine to Sit Rolling: Mod assist, Used rails   Supine to sit: +2 for physical assistance, Mod assist     General bed mobility comments: handheld assist RUE to come to sit EOB    Transfers Overall transfer level: Needs assistance Equipment used:  (2 person hand-on assist) Transfers: Bed to chair/wheelchair/BSC, Sit to/from Stand Sit to Stand: Max assist, +2 physical assistance          Lateral/Scoot Transfers: Max assist, +2 physical assistance, From elevated surface General transfer comment: maxA of 2 to scoot laterally from bed-drop-arm chair with use of pt's RLE on ground. assist of 2 with use of bed pad in addition to pt's RUE and RLE assisting to clear hips and scoot. x4 scoots from bed-chair. after pt positioned in chair, attemtped sit-stand to adjust pads underneath his bottom for better positioning and comfort. max-totalA fo 2 to generate a few inches of hip clearance    Ambulation/Gait  Stairs             Wheelchair Mobility     Tilt Bed    Modified Rankin (Stroke Patients  Only) Modified Rankin (Stroke Patients Only) Pre-Morbid Rankin Score: Moderate disability Modified Rankin: Severe disability     Balance Overall balance assessment: Needs assistance Sitting-balance support: Single extremity supported, Feet supported (R LE supported) Sitting balance-[name]: Fair Sitting balance - Comments: minA initially but progressed to CGA with RLE on ground, pt needing cues for increased use of core and positioning. dependent on RUE until in position at EOB. Postural control: Posterior lean   Standing balance-[name]: Zero                              Communication Communication Communication: Impaired Factors Affecting Communication: Other (comment) (Requires increased time for processing. Benefits from use of short, simple sentences. Often perseverates on topics.)  Cognition Arousal: Alert Behavior During Therapy: Flat affect, WFL for tasks assessed/performed   PT - Cognitive impairments: Problem solving, Safety/Judgement, Initiation                         Following commands: Impaired (pt needing repetition with some cues, but able to follow 2-step commands intermittently for mobility) Following commands impaired: Only follows one step commands consistently, Follows one step commands with increased time, Follows multi-step commands inconsistently    Cueing Cueing Techniques: Verbal cues, Tactile cues  Exercises      General Comments General comments (skin integrity, edema, etc.): VSS on RA      Pertinent Vitals/Pain Pain Assessment Pain Assessment: Faces Faces Pain Scale: Hurts a little bit (with bed mobility; sitting EOB) Pain Location: buttock and wounds Pain Descriptors / Indicators: Sore, Discomfort, Grimacing Pain Intervention(s): Limited activity within patient's tolerance, Monitored during session, Repositioned     PT Goals (current goals can now be found in the care plan section) Acute Rehab PT Goals Patient  Stated Goal: "get out of the hospital"; wants his LUE to move better PT Goal Formulation: With patient Time For Goal Achievement: 11/29/23 Potential to Achieve Goals: Fair Progress towards PT goals: Progressing toward goals    Frequency    Min 1X/week       AM-PAC PT "6 Clicks" Mobility   Outcome Measure  Help needed turning from your back to your side while in a flat bed without using bedrails?: A Lot Help needed moving from lying on your back to sitting on the side of a flat bed without using bedrails?: A Lot Help needed moving to and from a bed to a chair (including a wheelchair)?: Total Help needed standing up from a chair using your arms (e.g., wheelchair or bedside chair)?: Total Help needed to walk in hospital room?: Total Help needed climbing 3-5 steps with a railing? : Total 6 Click Score: 8    End of Session Equipment Utilized During Treatment: Other (comment) (bed pad) Activity Tolerance: Patient tolerated treatment well Patient left: with call bell/phone within reach;in chair Nurse Communication: Mobility status PT Visit Diagnosis: Other abnormalities of gait and mobility (R26.89);Other symptoms and signs involving the nervous system (R29.898);Hemiplegia and hemiparesis Hemiplegia - Right/Left: Left Hemiplegia - dominant/non-dominant: Non-dominant Hemiplegia - caused by: Cerebral infarction     Time: 8295-6213 PT Time Calculation (min) (ACUTE ONLY): 21 min  Charges:    $Therapeutic Activity: 8-22 mins PT General Charges $$ ACUTE PT VISIT: 1 Visit  Vickki Muff, PT, DPT   Acute Rehabilitation Department Office [phone] Secure Chat Communication Preferred    Ronnie Derby 11/19/2023, 2:47 PM

## 2023-11-19 NOTE — Progress Notes (Signed)
 [facility] Progress Note   Subjective:    Seen and examined patient at bedside. Tolerated yesterday's HD with net UF 3L. He reports eating [employer] everyday. Next HD 2/22.  Objective Vitals:   11/18/23 1706 11/18/23 2200 11/19/23 0441 11/19/23 0730  BP: 131/85 (!) 132/92 (!) 131/96 (!) 185/129  Pulse: (!) 110 (!) 110 (!) 104 (!) 114  Resp: 18 18 18    Temp:  98.5 F (36.9 C) 98.2 F (36.8 C) 98.3 F (36.8 C)  TempSrc:    Oral  SpO2: 96% 94% 95% 99%  Weight:      Height:       Physical Exam General: Alert, chronically ill male NAD Heart: RRR no MRG Lungs: CTA bilaterally RA, unlabored Abdomen: NABS, soft NT ND, trace abdominal edema Extremities: Left AKA no stump edema, RLE no edema. LUE edematous Dialysis Access: L FEM TDC dressing dry clear  Filed Weights   11/16/23 1424 11/16/23 1826 11/18/23 0753  Weight: 95.2 kg 94.2 kg 94.9 kg    Intake/Output Summary (Last 24 hours) at 11/19/2023 1315 Last data filed at 11/19/2023 0819 Gross per 24 hour  Intake 354 ml  Output 0 ml  Net 354 ml    Additional Objective Labs: Basic Metabolic Panel: Recent Labs  Lab 11/16/23 1417 11/17/23 0749  NA 133* 134*  K 5.6* 5.3*  CL 93* 96*  CO2 26 25  GLUCOSE 360* 94  BUN 58* 34*  CREATININE 8.19* 5.38*  CALCIUM 9.3 9.4  PHOS 7.6*  --    Liver Function Tests: No results for input(s): "AST", "ALT", "ALKPHOS", "BILITOT", "PROT", "ALBUMIN" in the last 168 hours. No results for input(s): "LIPASE", "AMYLASE" in the last 168 hours. CBC: Recent Labs  Lab 11/16/23 1417 11/17/23 0749  WBC 3.9* 3.5*  HGB 9.4* 10.4*  HCT 32.5* 35.1*  MCV 94.5 92.9  PLT 127* 115*   Blood Culture    Component Value Date/Time   SDES BLOOD SITE NOT SPECIFIED 08/25/2023 1706   SPECREQUEST  08/25/2023 1706    BOTTLES DRAWN AEROBIC ONLY Blood Culture results may not be optimal due to an inadequate volume of blood received in culture bottles   CULT  08/25/2023 1706    NO GROWTH 5  DAYS Performed at [facility], [address] [place], [name] [address]    REPTSTATUS 08/30/2023 FINAL 08/25/2023 1706    Cardiac Enzymes: No results for input(s): "CKTOTAL", "CKMB", "CKMBINDEX", "TROPONINI" in the last 168 hours. CBG: Recent Labs  Lab 11/18/23 1228 11/18/23 1704 11/18/23 2315 11/19/23 0737 11/19/23 1156  GLUCAP 121* 186* 103* 105* 176*   Iron Studies: No results for input(s): "IRON", "TIBC", "TRANSFERRIN", "FERRITIN" in the last 72 hours. Lab Results  Component Value Date   INR 1.2 07/22/2020   INR 1.0 05/30/2020   INR 1.2 04/24/2019   Studies/Results: No results found.  Medications:  iron sucrose Stopped (11/18/23 1101)    (feeding supplement) PROSource Plus  30 mL Oral BID BM   apixaban  5 mg Oral BID   bacitracin   Topical Daily   Chlorhexidine Gluconate Cloth  6 each Topical Q0600   darbepoetin (ARANESP) injection - DIALYSIS  150 mcg Subcutaneous Q Sun-1800   docusate sodium  100 mg Oral BID   gabapentin  100 mg Oral TID   Gerhardt's butt cream   Topical BID   hydrocortisone  25 mg Rectal BID   insulin aspart  0-5 Units Subcutaneous QHS   insulin aspart  0-9 Units  Subcutaneous TID WC   insulin glargine-yfgn  6 Units Subcutaneous Daily   lactulose  20 g Oral Once   loratadine  10 mg Oral Daily   midodrine  10 mg Oral Q T,Th,Sa-HD   pantoprazole  40 mg Oral Daily   polyethylene glycol  17 g Oral Daily   povidone-Iodine   Topical Q0600   QUEtiapine  12.5 mg Oral q morning   QUEtiapine  25 mg Oral QHS   senna-docusate  2 tablet Oral BID   sevelamer carbonate  2,400 mg Oral TID WC   sodium zirconium cyclosilicate  10 g Oral TID    Dialysis Orders:  MWF - [facility] -> may be will be going to [place] [facility] on discharge 4hr, 400/A1.5, EDW 77kg, L femoral TDC, 2K/3Ca bath, no heparin  Assessment/Plan: 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 Completed on extensive IV Vanc course on 1/29. S/p transfer to [facility] and drainage 08/31/24. Repeat brain MRI 10/17/23 showed improvement. Acute B CVA/septic emboli- due to above Acute LUE DVT: On Eliquis ESRD: Transitioned to TTS. Next HD 11/20/2023 HTN/volume: CT 10/09/23 with diffuse anasarca. Significantly above EDW but using bed weights. Max UF with HD. On Midodrine with HD and okay to use IV Albumin and extra Midodrine dose PRN. Enforcing fluid restrictions/compliance with full HD time. Appears he may be gaining body weight more so than fluid. He reports eating [employer]-fil-a everyday. 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 On Aranesp to . Iron studies from 2/6 include: Iron 31, Tsat 15%, and Ferritin 140. On Fe load X 10 doses with HD-1st dose 2/8. Secondary HPTH:  CCA ^, Continue to hold calcitriol for now. Sevelamer  ha been ^ to 3/meals.  Continues to have outside food and refuses any diet except regular (chronic problem with him despite counseling).phos 6.3 . Hyperkalemia: K+ 5.3. Continue Lokelma 10g TID for now, using low bath intermittently with HD.  As noted above diet indiscretion despite counseling. Checking labs in AM. Prostate fluid collection/abscess = noted per admit team urology consulted at outside hospital per their assessment "fluid collection was 2/2 dilated seminal vesicle with no surg.  Intervention recommended/ID concern for possible abscess patient covered with Vanc. and recommendation for outpatient urology follow-up" Nutrition: Alb 3.4,  continue supplements.  T1DM: per primary Dispo: Insurance denied [facility]. Continue search for SNF  Salome Holmes, NP [facility] 11/19/2023,1:15 PM  LOS: 71 days

## 2023-11-19 NOTE — Progress Notes (Signed)
 Advised by CSW that pt has received an offer from [facility]. Facility is stating they can only transport pt to [facility]) or [facility]. Contacted both clinics and  both are at capacity and unable to accept pt. Made CSW aware of this info and that pt could likely be placed at another [facility] clinic if snf will transport pt. Will assist as needed.   Olivia Canter Renal Navigator [phone]

## 2023-11-19 NOTE — Progress Notes (Signed)
 Occupational Therapy Treatment Patient Details Name: [name] MRN: [id number] DOB: 1981-04-11 Today's Date: 11/19/2023   History of present illness 43 y.o. male presents to [facility] 09/09/23 as a transfer from [facility] for further management of endocarditis. Pt originally was admitted to [facility] 07/09/23 for DKA and sepsis after being found on the ground. 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 10/17 pt had a R MCA, L occipital, and B cerebellum CVA w/ septic emboli. ICU admit 10/18 w/ intubation 10/18-10/24. Pt was transferred to [facility] for MV repair on 11/29. [facility], pt had cardiac cath w/ non obstructive disease, SAH, a craniotomy w/ abscess drainage on 12/4, and R third toe distal necrosis. PMH: chronic L AKA, MRSA, IDDM, ESRD on HD MWF, HTN, TEE positive for vegetation on multiple valves, cellulitis, medical noncompliance, narcotic dependence, DM with gastroparesis, L internal jugular DVT.   OT comments  Patient received seated in recliner and asking to return to bed. Patient instructed on lateral scoot transfer to right from drop arm recliner with max assist +2 and use of bed pad to assist. Patient returned to supine with min assist. PROM performed to LUE to address tone and edema. Patient will benefit from continued inpatient follow up therapy, <3 hours/day to maximize rehab potential.       If plan is discharge home, recommend the following:  Two people to help with walking and/or transfers;A lot of help with bathing/dressing/bathroom;Assistance with cooking/housework;Direct supervision/assist for medications management;Direct supervision/assist for financial management;Assist for transportation;Help with stairs or ramp for entrance;Supervision due to cognitive status   Equipment Recommendations  Other (comment) (defer to next venue of care)    Recommendations for Other Services      Precautions / Restrictions Precautions Precautions:  Fall Recall of Precautions/Restrictions: Intact Precaution/Restrictions Comments: L AKA, L hemiparesis and edema, L neglect, bowel incontinence. L femoral HD cath Restrictions Weight Bearing Restrictions Per Provider Order: No Other Position/Activity Restrictions: Pt has sling for LUE when mobilizing OOB; Avoid using L prosthesis on HD catheter       Mobility Bed Mobility Overal bed mobility: Needs Assistance Bed Mobility: Rolling, Sit to Supine Rolling: Mod assist, Used rails     Sit to supine: Min assist   General bed mobility comments: performed rolling for straightening pads    Transfers Overall transfer level: Needs assistance Equipment used:  (2 person hands on assist with use of bed pads) Transfers: Bed to chair/wheelchair/BSC            Lateral/Scoot Transfers: Max assist, +2 physical assistance, From elevated surface General transfer comment: transfer to EOB from drop arm recliner to the right. Patient requiring 3-4 scoots to complete transfer.     Balance Overall balance assessment: Needs assistance Sitting-balance support: Single extremity supported, Feet supported (RUE and RLE support) Sitting balance-[name]: Fair                                     ADL either performed or assessed with clinical judgement   ADL Overall ADL's : Needs assistance/impaired                                       General ADL Comments: focused on transfer back to bed and LUE ROM    Extremity/Trunk Assessment Upper Extremity Assessment Upper  Extremity Assessment: Right hand dominant LUE Deficits / Details: edematous L UE from shoulder to hand; pt hand with clawing position at rest; hemiparesis LUE Sensation: decreased light touch;decreased proprioception LUE Coordination: decreased fine motor;decreased gross motor            Vision       Perception     Praxis     Communication Communication Communication: Impaired Factors  Affecting Communication: Other (comment) (difficulty understanding medical jargon)   Cognition Arousal: Alert Behavior During Therapy: Flat affect, WFL for tasks assessed/performed Cognition: Cognition impaired     Awareness: Intellectual awareness intact, Online awareness impaired Memory impairment (select all impairments): Working [employer] fast streamer, Short-term memory Attention impairment (select first level of impairment): Selective attention Executive functioning impairment (select all impairments): Initiation, Organization, Sequencing, Reasoning, Problem solving                   Following commands: Impaired Following commands impaired: Only follows one step commands consistently, Follows one step commands with increased time, Follows multi-step commands inconsistently      Cueing   Cueing Techniques: Verbal cues, Tactile cues  Exercises Exercises: General Upper Extremity General Exercises - Upper Extremity Shoulder Flexion: PROM, Left, 5 reps, Supine Shoulder Extension: PROM, Left, 5 reps, Supine Shoulder ABduction: PROM, Left, 5 reps, Supine Elbow Flexion: PROM, Left, 5 reps, Supine Elbow Extension: PROM, Left, 5 reps, Supine Wrist Flexion: PROM, Left, Supine, 10 reps Wrist Extension: PROM, Left, 10 reps, Supine Digit Composite Flexion: PROM, Left, 10 reps, Supine Composite Extension: PROM, Left, 10 reps, Supine    Shoulder Instructions       General Comments vSS on RA    Pertinent Vitals/ Pain       Pain Assessment Pain Assessment: Faces Faces Pain Scale: Hurts little more (during transfer) Pain Location: buttock and wounds Pain Descriptors / Indicators: Sore, Discomfort, Grimacing Pain Intervention(s): Limited activity within patient's tolerance, Monitored during session, Repositioned  Home Living                                          Prior Functioning/Environment              Frequency  Min 1X/week        Progress Toward  Goals  OT Goals(current goals can now be found in the care plan section)  Progress towards OT goals: Progressing toward goals  Acute Rehab OT Goals Patient Stated Goal: to to SNF for rehab OT Goal Formulation: With patient Time For Goal Achievement: 11/24/23 Potential to Achieve Goals: Good ADL Goals Pt Will Perform Eating: with modified independence;sitting Pt Will Perform Grooming: with modified independence;sitting Pt/caregiver will Perform Home Exercise Program: Left upper extremity;Increased ROM;Right Upper extremity;Increased strength;With Supervision Additional ADL Goal #1: Patient will demonstrate ability to follow 2-step instructions appropriately in 8/10 attempts. Additional ADL Goal #2: pt will demonstrate bed Rolling transfer with hooking R UE 100% of attempts Additional ADL Goal #3: Patient will demonstrate ability to sit EOB with Fair balance for 3 or more minutes during a functional or therapeutic task. Additional ADL Goal #4: Patient will demonstrate ability to doff/donn R post-op shoe with Supervision.  Plan      Co-evaluation                 AM-PAC OT "6 Clicks" Daily Activity     Outcome Measure   Help from another person eating  meals?: A Little Help from another person taking care of personal grooming?: A Little Help from another person toileting, which includes using toliet, bedpan, or urinal?: A Lot Help from another person bathing (including washing, rinsing, drying)?: A Lot Help from another person to put on and taking off regular upper body clothing?: A Little Help from another person to put on and taking off regular lower body clothing?: A Lot 6 Click Score: 15    End of Session Equipment Utilized During Treatment: Gait belt  OT Visit Diagnosis: Other symptoms and signs involving cognitive function;Hemiplegia and hemiparesis Hemiplegia - Right/Left: Left Hemiplegia - dominant/non-dominant: Non-Dominant Hemiplegia - caused by: Cerebral  infarction   Activity Tolerance Patient tolerated treatment well   Patient Left in bed;with call bell/phone within reach;with bed alarm set   Nurse Communication Mobility status (patient asking for bedpan)        Time: 1430-1447 OT Time Calculation (min): 17 min  Charges: OT General Charges $OT Visit: 1 Visit OT Treatments $Therapeutic Activity: 8-22 mins  Alfonse Flavors, OTA Acute Rehabilitation Services  Office [phone]   Dewain Penning 11/19/2023, 3:02 PM

## 2023-11-20 LAB — GLUCOSE, CAPILLARY
Glucose-Capillary: 529 mg/dL (ref 70–99)
Glucose-Capillary: 187 mg/dL — ABNORMAL HIGH (ref 70–99)
Glucose-Capillary: 189 mg/dL — ABNORMAL HIGH (ref 70–99)
Glucose-Capillary: 179 mg/dL — ABNORMAL HIGH (ref 70–99)
Glucose-Capillary: 180 mg/dL — ABNORMAL HIGH (ref 70–99)
Glucose-Capillary: 163 mg/dL — ABNORMAL HIGH (ref 70–99)

## 2023-11-20 MED ORDER — HEPARIN BOLUS VIA INFUSION
4000.0000 [IU] | Freq: Once | INTRAVENOUS | Status: DC
  Administered 2023-11-20: 4000 [IU] via INTRAVENOUS

## 2023-11-20 MED ORDER — HEPARIN SODIUM (PORCINE) 1000 UNIT/ML IJ SOLN
INTRAMUSCULAR | Status: AC
  Administered 2023-11-20: 1000 [IU]
  Filled 2023-11-20: qty 4

## 2023-11-20 NOTE — Procedures (Signed)
 I was present at the procedure, reviewed the HD regimen and made appropriate changes.   Vinson Moselle MD  [facility] 11/20/2023, 7:55 PM

## 2023-11-20 NOTE — Progress Notes (Signed)
 PROGRESS NOTE    [name]  ION:[id number] DOB: [birth year] DOA: 09/09/2023 PCP: [name], NP   Brief Narrative:  Patient is a 43 y.o. male with ESRD on hemodialysis (not fully compliant), insulin-dependent type 1 diabetes, gastroparesis, Left AKA, admitted to [facility] on 07/09/23 for DKA, Sepsis and hospital course significant for MV AND TV  MRSA endocarditis, followed by acute MCA stroke , left occipital and bilateral cerebellar strokes consistent with septic emboli,  Brain abscess from MRSA, left IJ DVT was transferred to [facility] for MV repair on 11/29.  While at [facility] he underwent cardiac cath showing non obstructive disease, craniotomy with abscess drainage on 09/01/23,. He had subarachnoid hemorrhage while on IV heparin, right third toe distal necrosis,was seen by podiatry, but family wanted conservative management. Since his tertiary care needs have been completed, he was transferred back to [facility] for further management.   PTOT  recommending SNF and awaiting placement.   Assessment & Plan:   Principal Problem:   Endocarditis Active Problems:   MRSA bacteremia   Endocarditis of tricuspid valve   ESRD on dialysis ([name])   Cognitive impairment   Insulin dependent type 1 diabetes mellitus ([name])   Diabetic gastroparesis ([name])   Diabetic foot infection ([name])   Endocarditis of mitral valve   Cerebrovascular accident (CVA) due to embolism of cerebral artery ([name])  MRSA BACTEREMIA WITH TV/MV endocarditis - resolved - Positive MRSA blood cultures. Diagnosed on TEE, managed on Vancomycin. -Underwent left heart catheterization at  [facility] which was significant for nonobstructive disease.  -ID recommended to continue IV vancomycin, patient has completed 8-week treatment course on 10/27/2023 -No acute complaints   Prostate fluid collection/abscess-resolved -Seen by [facility] urology, per their assessment, fluid collection was secondary to dilated seminal vesicle with no surgical  intervention recommended.  -Infectious disease with continued concern for possible abscess, received IV vancomycin with recommendation for outpatient urology follow-up ?  [facility]. 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  -Patient was evaluated by neurosurgery at [facility], status post craniotomy on 09/01/23 for abscess drainage.   -Repeat brain MRI 10/17/2023 showed improvement   ESRD on HD, TTS -Nephrology following -Undergoing HD per schedule, TTS   Hyperkalemia -Being treated with Lokelma 3 times daily and HD per schedule.   Anemia of chronic kidney disease -H&H stable, 10.4 on 2/19   CKD mineral bone disease -Continue calcitriol   Acute CVA -MRI confirms large right MCA branch infarct. - MRA head (11/25) significant for large necrotic appearing lesion within the right frontal lobe within area of the prior right MCA territory infarct with vasogenic edema and 8 mm leftward midline shift. - LDL of 34. Hemoglobin A1C of 8.9%. -2D echo 10/13 showed LVEF of 25-30% with concern for AV/MV abnormality -Transesophageal Echocardiogram (10/16 & 10/22) significant for MV/TV vegetations.  -Neurology recommendations for Eliquis.  -PT/OT recommendations for SNF.   -Patient with significant left upper extremity paresis. -Repeat MRI brain on 1/19 showed decreased area of abnormal diffusion restriction within the posterior right frontal operculum compatible with reported abscess, amount of surrounding edema has also decreased.   Primary hypertension -Patient was on metoprolol as an outpatient. Metoprolol held secondary to hypotension with dialysis.  Patient has been started on midodrine.  Patient's blood pressure very labile, he was hypotensive 2 days ago and hypertensive this morning.  Hesitant to place him on any antihypertensives due to more risk of hypotension.  Monitor for now.   Acute  left upper extremity DVT -Continue Eliquis    Right third toe necrosis - Per documentation, patient/family opting for conservative management and decline surgical management.  - Dr Sharlene Dory discussed with patient's mother on 2/11 who brought up the idea of podiatry, however if no surgical intervention is going to be pursued at this time, likely may not be of benefit. -Does not appear to be acutely infected, continue conservative management at this time.  If infected or necrosis spreads, will consult podiatry or orthopedic surgery   Diabetes mellitus type 1 - Poorly controlled with hyperglycemia and hypoglycemia, likely due to poor dietary choices.  - Hemoglobin A1C of 8.9%.  Continue SSI, Semglee 6 units daily   Gastroparesis -No acute issues.  Cont glycemic control    History of left AKA -Noted. Patient seen by PT/OT with recommendation for SNF on discharge.   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 Patient started on Seroquel for behavioral change management.  Appears to be improving.  Patient very conversant, A&O x 4.   Pressure injury -Left buttock, not present on admission  -Wound care per nursing    Constipation -Refuses suppositories, enema, continue Colace, MiraLAX    Obesity class II Estimated body mass index is 32.77 kg/m as calculated from the following:   Height as of this encounter: 5\' 7"  (1.702 m).   Weight as of this encounter: 94.9 kg.  Disposition: I was informed by TOC on 11/19/2023 that patient has been accepted to [facility] authorization has been received as well however the facility is only able to accommodate transportation for HD to [facility] on [address] or [facility] on [address]. Renal and per renal navigator, both of these outpatient dialysis centers are at capacity so currently waiting for opening at any of those places.  DVT prophylaxis: eliquis   Code Status: Full Code  Family Communication:  None present at bedside.  Plan of care discussed with  patient in length and he/she verbalized understanding and agreed with it.  Status is: Inpatient Remains inpatient appropriate because: Medically stable since very long time, pending placement as detailed above.   Estimated body mass index is 32.77 kg/m as calculated from the following:   Height as of this encounter: 5\' 7"  (1.702 m).   Weight as of this encounter: 94.9 kg.  Pressure Injury 08/03/23 Buttocks Left Stage 2 -  Partial thickness loss of dermis presenting as a shallow open injury with a red, pink wound bed without slough. Several small open areas, right buttocks area (Active)  08/03/23 1945  Location: Buttocks  Location Orientation: Left  Staging: Stage 2 -  Partial thickness loss of dermis presenting as a shallow open injury with a red, pink wound bed without slough.  Wound Description (Comments): Several small open areas, right buttocks area  Present on Admission: No (present on transfer to [facility])  Dressing Type None 11/18/23 0100   Nutritional Assessment: Body mass index is 32.77 kg/m.Marland Kitchen Seen by dietician.  I agree with the assessment and plan as outlined below: Nutrition Status:        . Skin Assessment: I have examined the patient's skin and I agree with the wound assessment as performed by the wound care RN as outlined below: Pressure Injury 08/03/23 Buttocks Left Stage 2 -  Partial thickness loss of dermis presenting as a shallow open injury with a red, pink wound bed without slough. Several small open areas, right buttocks area (Active)  08/03/23 1945  Location: Buttocks  Location Orientation: Left  Staging: Stage 2 -  Partial thickness loss of dermis presenting as a shallow open injury with a red, pink wound bed without slough.  Wound Description (Comments): Several small open areas, right buttocks area  Present on Admission: No (present on transfer to [facility])  Dressing Type None 11/18/23 0100    Consultants:  Nephrology  Procedures:  As  above  Antimicrobials:  Anti-infectives (From admission, onward)    Start     Dose/Rate Route Frequency Ordered Stop   10/21/23 1230  vancomycin (VANCOCIN) 750 mg in sodium chloride 0.9 % 250 mL IVPB  Status:  Discontinued        750 mg 265 mL/hr over 60 Minutes Intravenous Every T-Th-Sa (Hemodialysis) 10/21/23 0804 10/21/23 0811   10/21/23 1200  vancomycin (VANCOCIN) 750 mg in sodium chloride 0.9 % 250 mL IVPB        750 mg 250 mL/hr over 60 Minutes Intravenous Every T-Th-Sa (Hemodialysis) 10/21/23 0811 10/27/23 1413   10/16/23 1345  vancomycin (VANCOCIN) 750 mg in sodium chloride 0.9 % 250 mL IVPB  Status:  Discontinued        750 mg 265 mL/hr over 60 Minutes Intravenous Every T-Th-Sa (Hemodialysis) 10/16/23 1257 10/21/23 0804   10/16/23 1200  vancomycin (VANCOREADY) IVPB 750 mg/150 mL  Status:  Discontinued        750 mg 150 mL/hr over 60 Minutes Intravenous Every T-Th-Sa (Hemodialysis) 10/14/23 1843 10/16/23 1257   10/14/23 1845  vancomycin (VANCOREADY) IVPB 500 mg/100 mL        500 mg 100 mL/hr over 60 Minutes Intravenous  Once 10/14/23 1843 10/15/23 0806   10/14/23 1200  vancomycin (VANCOREADY) IVPB 500 mg/100 mL  Status:  Discontinued        500 mg 100 mL/hr over 60 Minutes Intravenous Every T-Th-Sa (Hemodialysis) 10/13/23 1309 10/14/23 1843   10/13/23 1200  vancomycin (VANCOREADY) IVPB 500 mg/100 mL  Status:  Discontinued        500 mg 100 mL/hr over 60 Minutes Intravenous Every M-W-F (Hemodialysis) 10/11/23 1517 10/13/23 1309   10/12/23 1200  vancomycin (VANCOREADY) IVPB 500 mg/100 mL  Status:  Discontinued        500 mg 100 mL/hr over 60 Minutes Intravenous Every T-Th-Sa (Hemodialysis) 10/11/23 1514 10/11/23 1517   09/24/23 1200  vancomycin (VANCOREADY) IVPB 500 mg/100 mL  Status:  Discontinued        500 mg 100 mL/hr over 60 Minutes Intravenous Every M-W-F (Hemodialysis) 09/21/23 1246 10/11/23 1514   09/20/23 1200  vancomycin (VANCOCIN) 750 mg in sodium chloride 0.9 % 250  mL IVPB  Status:  Discontinued        750 mg 265 mL/hr over 60 Minutes Intravenous Every M-W-F (Hemodialysis) 09/20/23 0446 09/21/23 1246   09/20/23 1045  vancomycin (VANCOCIN) 750 mg in sodium chloride 0.9 % 250 mL IVPB        750 mg 265 mL/hr over 60 Minutes Intravenous  Once 09/20/23 0959 09/20/23 1226   09/20/23 0945  vancomycin (VANCOREADY) IVPB 750 mg/150 mL  Status:  Discontinued        750 mg 150 mL/hr over 60 Minutes Intravenous  Once 09/20/23 0855 09/20/23 0959   09/10/23 1200  vancomycin (VANCOREADY) IVPB 750 mg/150 mL  Status:  Discontinued        750 mg 150 mL/hr over 60 Minutes Intravenous Every M-W-F (Hemodialysis) 09/09/23 2030 09/20/23 0446   09/09/23 2200  linezolid (ZYVOX) tablet 600 mg  Status:  Discontinued  600 mg Oral Every 12 hours 09/09/23 2035 09/10/23 0946         Subjective: Patient seen and examined in dialysis.  He has no complaints.  Objective: Vitals:   11/19/23 0730 11/19/23 1500 11/19/23 1956 11/20/23 0508  BP: (!) 185/129 (!) 138/94 (!) 145/105 (!) 150/104  Pulse: (!) 114 (!) 108 (!) 113 (!) 105  Resp:   20 18  Temp: 98.3 F (36.8 C) 98 F (36.7 C) 98 F (36.7 C) (!) 97.5 F (36.4 C)  TempSrc: Oral Oral  Axillary  SpO2: 99% 97% 97% 98%  Weight:      Height:        Intake/Output Summary (Last 24 hours) at 11/20/2023 0809 Last data filed at 11/19/2023 1500 Gross per 24 hour  Intake 354 ml  Output 0 ml  Net 354 ml   Filed Weights   11/16/23 1424 11/16/23 1826 11/18/23 0753  Weight: 95.2 kg 94.2 kg 94.9 kg    Examination:  General exam: Appears calm and comfortable  Respiratory system: Clear to auscultation. Respiratory effort normal. Cardiovascular system: S1 & S2 heard, RRR. No JVD, murmurs, rubs, gallops or clicks.  +2-3 pitting edema left upper extremity. Gastrointestinal system: Abdomen is nondistended, soft and nontender. No organomegaly or masses felt. Normal bowel sounds heard. Central nervous system: Alert and  oriented. No focal neurological deficits. Extremities: Left AKA Skin: No rashes, lesions or ulcers.    Data Reviewed: I have personally reviewed following labs and imaging studies  CBC: Recent Labs  Lab 11/16/23 1417 11/17/23 0749  WBC 3.9* 3.5*  HGB 9.4* 10.4*  HCT 32.5* 35.1*  MCV 94.5 92.9  PLT 127* 115*   Basic Metabolic Panel: Recent Labs  Lab 11/16/23 1417 11/17/23 0749  NA 133* 134*  K 5.6* 5.3*  CL 93* 96*  CO2 26 25  GLUCOSE 360* 94  BUN 58* 34*  CREATININE 8.19* 5.38*  CALCIUM 9.3 9.4  PHOS 7.6*  --    GFR: Estimated Creatinine Clearance: 19.2 mL/min (A) (by C-G formula based on SCr of 5.38 mg/dL (H)). Liver Function Tests: No results for input(s): "AST", "ALT", "ALKPHOS", "BILITOT", "PROT", "ALBUMIN" in the last 168 hours. No results for input(s): "LIPASE", "AMYLASE" in the last 168 hours. No results for input(s): "AMMONIA" in the last 168 hours. Coagulation Profile: No results for input(s): "INR", "PROTIME" in the last 168 hours. Cardiac Enzymes: No results for input(s): "CKTOTAL", "CKMB", "CKMBINDEX", "TROPONINI" in the last 168 hours. BNP (last 3 results) No results for input(s): "PROBNP" in the last 8760 hours. HbA1C: No results for input(s): "HGBA1C" in the last 72 hours. CBG: Recent Labs  Lab 11/19/23 0737 11/19/23 1156 11/19/23 1629 11/19/23 2117 11/20/23 0741  GLUCAP 105* 176* 215* 263* 529*   Lipid Profile: No results for input(s): "CHOL", "HDL", "LDLCALC", "TRIG", "CHOLHDL", "LDLDIRECT" in the last 72 hours. Thyroid Function Tests: No results for input(s): "TSH", "T4TOTAL", "FREET4", "T3FREE", "THYROIDAB" in the last 72 hours. Anemia Panel: No results for input(s): "VITAMINB12", "FOLATE", "FERRITIN", "TIBC", "IRON", "RETICCTPCT" in the last 72 hours. Sepsis Labs: No results for input(s): "PROCALCITON", "LATICACIDVEN" in the last 168 hours.  No results found for this or any previous visit (from the past 240 hours).   Radiology  Studies: No results found.  Scheduled Meds:  (feeding supplement) PROSource Plus  30 mL Oral BID BM   apixaban  5 mg Oral BID   bacitracin   Topical Daily   Chlorhexidine Gluconate Cloth  6 each Topical Q0600  darbepoetin (ARANESP) injection - DIALYSIS  150 mcg Subcutaneous Q Sun-1800   docusate sodium  100 mg Oral BID   gabapentin  100 mg Oral TID   Gerhardt's butt cream   Topical BID   hydrocortisone  25 mg Rectal BID   insulin aspart  0-5 Units Subcutaneous QHS   insulin aspart  0-9 Units Subcutaneous TID WC   insulin glargine-yfgn  6 Units Subcutaneous Daily   lactulose  20 g Oral Once   loratadine  10 mg Oral Daily   midodrine  10 mg Oral Q T,Th,Sa-HD   pantoprazole  40 mg Oral Daily   polyethylene glycol  17 g Oral Daily   povidone-Iodine   Topical Q0600   QUEtiapine  12.5 mg Oral q morning   QUEtiapine  25 mg Oral QHS   senna-docusate  2 tablet Oral BID   sevelamer carbonate  2,400 mg Oral TID WC   sodium zirconium cyclosilicate  10 g Oral TID   Continuous Infusions:  iron sucrose Stopped (11/18/23 1101)     LOS: 72 days   Hughie Closs, MD [facility]  11/20/2023, 8:09 AM   *Please note that this is a verbal dictation therefore any spelling or grammatical errors are due to the "Dragon Medical One" system interpretation.  Please page via Amion and do not message via secure chat for urgent patient care matters. Secure chat can be used for non urgent patient care matters.  How to contact the [facility] Attending or Consulting provider 7A - 7P or covering provider during after hours 7P -7A, for this patient?  Check the care team in [facility] and look for a) attending/consulting TRH provider listed and b) the [facility] team listed. Page or secure chat 7A-7P. Log into [email or web address] and use [place]'s universal password to access. If you do not have the password, please contact the hospital operator. Locate the [facility] provider you are looking for under [facility] and page to  a number that you can be directly reached. If you still have difficulty reaching the provider, please page the [facility] (Director on Call) for the Hospitalists listed on amion for assistance.

## 2023-11-20 NOTE — Progress Notes (Signed)
 Pt cut off 1 hour d/t tired. AMA form signed. Meds given during HD: Oxycodone 5mg  po. Midodrine 10 mg po. Dilaudid 0.5 mg IV. Tylenol 325 mg po.  11/20/23 1207  Vitals  Temp 97.9 F (36.6 C)  BP 110/82  BP Location Right Arm  BP Method Automatic  Patient Position (if appropriate) Lying  Pulse Rate 82  Resp 10  Oxygen Therapy  SpO2 99 %  O2 Device Room Air  Patient Activity (if Appropriate) In bed  During Treatment Monitoring  HD Safety Checks Performed Yes  Intra-Hemodialysis Comments See progress note  Post Treatment  Dialyzer Clearance Clear  Liters Processed 42  Fluid Removed (mL) 2500 mL  Tolerated HD Treatment Yes  Post-Hemodialysis Comments Pt cut off one hour.  Hemodialysis Catheter Left Femoral vein Double lumen Permanent (Tunneled)  Placement Date/Time: 07/16/23 1318   Serial / Lot #: [id number]  Expiration Date: 05/24/26  Time Out: Correct patient;Correct site;Correct procedure  Maximum sterile barrier precautions: Hand hygiene;Cap;Mask;Sterile gown;Sterile gloves;Large sterile ...  Site Condition No complications  Blue Lumen Status Heparin locked  Red Lumen Status Heparin locked  Catheter fill solution Heparin 1000 units/ml  Catheter fill volume (Arterial) 1.9 cc  Catheter fill volume (Venous) 1.9  Dressing Type Transparent  Dressing Status Clean, Dry, Intact  Interventions Dressing reinforced  Drainage Description None  Dressing Change Due 11/22/23  Post treatment catheter status Capped and Clamped

## 2023-11-21 LAB — GLUCOSE, CAPILLARY
Glucose-Capillary: 108 mg/dL — ABNORMAL HIGH (ref 70–99)
Glucose-Capillary: 180 mg/dL — ABNORMAL HIGH (ref 70–99)
Glucose-Capillary: 96 mg/dL (ref 70–99)
Glucose-Capillary: 162 mg/dL — ABNORMAL HIGH (ref 70–99)

## 2023-11-21 NOTE — Plan of Care (Signed)
  Problem: Clinical Measurements: Goal: Ability to maintain clinical measurements within normal limits will improve Outcome: Progressing Goal: Will remain free from infection Outcome: Progressing Goal: Cardiovascular complication will be avoided Outcome: Progressing   Problem: Activity: Goal: Risk for activity intolerance will decrease Outcome: Progressing   Problem: Coping: Goal: Level of anxiety will decrease Outcome: Progressing   Problem: Skin Integrity: Goal: Risk for impaired skin integrity will decrease Outcome: Progressing   Problem: Education: Goal: Ability to describe self-care measures that may prevent or decrease complications (Diabetes Survival Skills Education) will improve Outcome: Progressing Goal: Individualized Educational Video(s) Outcome: Progressing   Problem: Coping: Goal: Ability to adjust to condition or change in health will improve Outcome: Progressing   Problem: Fluid Volume: Goal: Ability to maintain a balanced intake and output will improve Outcome: Progressing   Problem: Health Behavior/Discharge Planning: Goal: Ability to identify and utilize available resources and services will improve Outcome: Progressing Goal: Ability to manage health-related needs will improve Outcome: Progressing   Problem: Metabolic: Goal: Ability to maintain appropriate glucose levels will improve Outcome: Progressing   Problem: Skin Integrity: Goal: Risk for impaired skin integrity will decrease Outcome: Progressing   Problem: Tissue Perfusion: Goal: Adequacy of tissue perfusion will improve Outcome: Progressing   Problem: Education: Goal: Knowledge of disease and its progression will improve Outcome: Progressing Goal: Individualized Educational Video(s) Outcome: Progressing   Problem: Fluid Volume: Goal: Compliance with measures to maintain balanced fluid volume will improve Outcome: Progressing   Problem: Health Behavior/Discharge Planning: Goal:  Ability to manage health-related needs will improve Outcome: Progressing   Problem: Nutritional: Goal: Ability to make healthy dietary choices will improve Outcome: Progressing   Problem: Clinical Measurements: Goal: Complications related to the disease process, condition or treatment will be avoided or minimized Outcome: Progressing   Problem: Clinical Measurements: Goal: Will remain free from infection Outcome: Progressing

## 2023-11-21 NOTE — Progress Notes (Signed)
 PROGRESS NOTE    [name]  NGE:[id number] DOB: 1981/07/23 DOA: 09/09/2023 PCP: [name], NP   Brief Narrative:  43 y.o. 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  While at [facility] he underwent cardiac cath showing non obstructive disease, craniotomy with abscess drainage on 09/01/23,. He had subarachnoid hemorrhage while on IV heparin, right third toe distal necrosis,was seen by podiatry, but family wanted conservative management. Since his tertiary care needs have been completed, he was transferred back to [facility] for further management.  PTOT  recommending SNF and awaiting placement.   Assessment & Plan:    MRSA TV/MV endocarditis: 10/12, blood cultures positive for MRSA. 10/13, TTE showed LVEF of 25-30% with concern for AV/MV abnormality  10/16 and 10/22, TEE showed MV/TV vegetations.  -Treated with IV vancomycin.  Per ID recommendation: complete 8 weeks of treatment.  Patient completed antibiotic treatment on 10/27/2023. Recommend outpatient follow up with ID.    History of hypertension Hypotension: -Blood pressure has remained very labile with intermittent episodes of hypotension and hypotension.  Currently on midodrine on dialysis days.  Monitor blood pressure.  Will hold off on resuming metoprolol for now.   ESRD on HD:  Nephrology on board.  Continue hemodialysis as per nephrology.  Hyperkalemia -Nephrology following.  Currently on Lokelma   Acute CVA /acute metabolic encephalopathy: Mri brain on 10/17 showed large right MCA infarct.  11/25, MRA head showed  significant for large necrotic appearing lesion within the right frontal lobe within area of the prior right MCA territory infarct with vasogenic edema and 8 mm leftward midline shift.  Neurology recommendations for Eliquis.  PT/OT recommendations for SNF. Repeat MRI brain done for increased confusion on 10/17/2023: Showed decreased size of area of abnormal diffusion restriction within the posterior right frontal operculum compatible with reported abscess. The amount of surrounding edema has also decreased -Neurology has already signed off: Outpatient follow-up with neurology.   Cerebral emboli with abscess/ Cerebritis -Secondary to to MRSA Endocarditis.  -S/p craniotomy and abscess drainage.  Repeat MRI on 10/17/2023 showed improvement   Type 1 DM uncontrolled with hyperglycemia and hypoglycemia: -Continue long-acting insulin.  Continue CBGs with SSI.  Carb modified diet.   Acute left upper extremity DVT: -Continue Eliquis.  -Left upper extremity venous duplex showed age-indeterminate DVT involving the left axillary vein.   Anemia of chronic kidney disease: -Hemoglobin currently stable.  Monitor intermittently   Right third toe necrosis: H/o prior left AKA: Physical deconditioning Per documentation, patient/family opted for conservative management and declined surgical management. Patient was seen by PT/OT with recommendation for SNF on discharge.   Prostate fluid collection: Urology evaluated patient at [facility], suggested that its a dilated seminal vesicle and recommended for no surgical intervention.    Mild cognitive dysfunction with behavioral changes.  Currently stable on seroquel.   Obesity class I -Outpatient follow-up  Pressure injury -Left buttock: Present on admission.  Continue local wound care as per nursing  Pancytopenia -No signs of bleeding.  Monitor intermittently  Hyponatremia -Mild.  Managed by hemodialysis by nephrology   DVT prophylaxis: Eliquis Code  Status: Full Family Communication: None at  bedside Disposition Plan: Status is: Inpatient Remains inpatient appropriate because: Of severity of illness.  Need for SNF placement.  Currently medically stable for discharge    Consultants: Nephrology/psychiatry/ID  Procedures: As above  Antimicrobials:  Anti-infectives (From admission, onward)    Start     Dose/Rate Route Frequency Ordered Stop   10/21/23 1230  vancomycin (VANCOCIN) 750 mg in sodium chloride 0.9 % 250 mL IVPB  Status:  Discontinued        750 mg 265 mL/hr over 60 Minutes Intravenous Every T-Th-Sa (Hemodialysis) 10/21/23 0804 10/21/23 0811   10/21/23 1200  vancomycin (VANCOCIN) 750 mg in sodium chloride 0.9 % 250 mL IVPB        750 mg 250 mL/hr over 60 Minutes Intravenous Every T-Th-Sa (Hemodialysis) 10/21/23 0811 10/27/23 1413   10/16/23 1345  vancomycin (VANCOCIN) 750 mg in sodium chloride 0.9 % 250 mL IVPB  Status:  Discontinued        750 mg 265 mL/hr over 60 Minutes Intravenous Every T-Th-Sa (Hemodialysis) 10/16/23 1257 10/21/23 0804   10/16/23 1200  vancomycin (VANCOREADY) IVPB 750 mg/150 mL  Status:  Discontinued        750 mg 150 mL/hr over 60 Minutes Intravenous Every T-Th-Sa (Hemodialysis) 10/14/23 1843 10/16/23 1257   10/14/23 1845  vancomycin (VANCOREADY) IVPB 500 mg/100 mL        500 mg 100 mL/hr over 60 Minutes Intravenous  Once 10/14/23 1843 10/15/23 0806   10/14/23 1200  vancomycin (VANCOREADY) IVPB 500 mg/100 mL  Status:  Discontinued        500 mg 100 mL/hr over 60 Minutes Intravenous Every T-Th-Sa (Hemodialysis) 10/13/23 1309 10/14/23 1843   10/13/23 1200  vancomycin (VANCOREADY) IVPB 500 mg/100 mL  Status:  Discontinued        500 mg 100 mL/hr over 60 Minutes Intravenous Every M-W-F (Hemodialysis) 10/11/23 1517 10/13/23 1309   10/12/23 1200  vancomycin (VANCOREADY) IVPB 500 mg/100 mL  Status:  Discontinued        500 mg 100 mL/hr over 60 Minutes Intravenous Every T-Th-Sa (Hemodialysis) 10/11/23  1514 10/11/23 1517   09/24/23 1200  vancomycin (VANCOREADY) IVPB 500 mg/100 mL  Status:  Discontinued        500 mg 100 mL/hr over 60 Minutes Intravenous Every M-W-F (Hemodialysis) 09/21/23 1246 10/11/23 1514   09/20/23 1200  vancomycin (VANCOCIN) 750 mg in sodium chloride 0.9 % 250 mL IVPB  Status:  Discontinued        750 mg 265 mL/hr over 60 Minutes Intravenous Every M-W-F (Hemodialysis) 09/20/23 0446 09/21/23 1246   09/20/23 1045  vancomycin (VANCOCIN) 750 mg in sodium chloride 0.9 % 250 mL IVPB        750 mg 265 mL/hr over 60 Minutes Intravenous  Once 09/20/23 0959 09/20/23 1226   09/20/23 0945  vancomycin (VANCOREADY) IVPB 750 mg/150 mL  Status:  Discontinued        750 mg 150 mL/hr over 60 Minutes Intravenous  Once 09/20/23 0855 09/20/23 0959   09/10/23 1200  vancomycin (VANCOREADY) IVPB 750 mg/150 mL  Status:  Discontinued        750 mg 150 mL/hr over 60 Minutes Intravenous Every M-W-F (Hemodialysis) 09/09/23 2030 09/20/23 0446   09/09/23 2200  linezolid (ZYVOX) tablet 600 mg  Status:  Discontinued        600 mg Oral Every 12 hours 09/09/23 2035 09/10/23 0946        Subjective: Patient seen and examined at bedside.  Poor historian.  No fever, vomiting, agitation or seizures reported.   Objective: Vitals:   11/20/23 1636 11/20/23 1929 11/20/23 1929 11/21/23 0501  BP: (!) 97/55 (!) 131/96 (!) 131/96 (!) 139/94  Pulse:  (!) 122 (!) 121 (!) 107  Resp: 17 17 17 18   Temp: 98.6 F (37 C) 98 F (36.7 C) 98 F (36.7 C) (!) 97.4 F (36.3 C)  TempSrc:    Oral  SpO2: 98% (!) 86% (!) 87% 96%  Weight:      Height:        Intake/Output Summary (Last 24 hours) at 11/21/2023 0806 Last data filed at 11/20/2023 2322 Gross per 24 hour  Intake 1020 ml  Output 2500 ml  Net -1480 ml   Filed Weights   11/16/23 1826 11/18/23 0753 11/20/23 0840  Weight: 94.2 kg 94.9 kg 91.6 kg    Examination:  General: On room air.  No distress.  Poor historian.  Chronically ill and deconditioned  looking.  Flat affect.   respiratory: Decreased breath sounds at bases bilaterally with some crackles CVS: S1-S2 heard; tachycardic currently  abdominal: Soft, obese, nontender, slightly distended; no organomegaly; bowel sounds normally heard  extremities: Left AKA present; trace right lower extremity edema present  Data Reviewed: I have personally reviewed following labs and imaging studies  CBC: Recent Labs  Lab 11/16/23 1417 11/17/23 0749  WBC 3.9* 3.5*  HGB 9.4* 10.4*  HCT 32.5* 35.1*  MCV 94.5 92.9  PLT 127* 115*   Basic Metabolic Panel: Recent Labs  Lab 11/16/23 1417 11/17/23 0749  NA 133* 134*  K 5.6* 5.3*  CL 93* 96*  CO2 26 25  GLUCOSE 360* 94  BUN 58* 34*  CREATININE 8.19* 5.38*  CALCIUM 9.3 9.4  PHOS 7.6*  --    GFR: Estimated Creatinine Clearance: 18.9 mL/min (A) (by C-G formula based on SCr of 5.38 mg/dL (H)). Liver Function Tests: No results for input(s): "AST", "ALT", "ALKPHOS", "BILITOT", "PROT", "ALBUMIN" in the last 168 hours.  No results for input(s): "LIPASE", "AMYLASE" in the last 168 hours. No results for input(s): "AMMONIA" in the last 168 hours. Coagulation Profile: No results for input(s): "INR", "PROTIME" in the last 168 hours. Cardiac Enzymes: No results for input(s): "CKTOTAL", "CKMB", "CKMBINDEX", "TROPONINI" in the last 168 hours. BNP (last 3 results) No results for input(s): "PROBNP" in the last 8760 hours. HbA1C: No results for input(s): "HGBA1C" in the last 72 hours. CBG: Recent Labs  Lab 11/20/23 1000 11/20/23 1002 11/20/23 1247 11/20/23 1631 11/20/23 2114  GLUCAP 189* 187* 179* 180* 163*   Lipid Profile: No results for input(s): "CHOL", "HDL", "LDLCALC", "TRIG", "CHOLHDL", "LDLDIRECT" in the last 72 hours. Thyroid Function Tests: No results for input(s): "TSH", "T4TOTAL", "FREET4", "T3FREE", "THYROIDAB" in the last 72 hours. Anemia Panel: No results for input(s): "VITAMINB12", "FOLATE", "FERRITIN", "TIBC", "IRON",  "RETICCTPCT" in the last 72 hours. Sepsis Labs: No results for input(s): "PROCALCITON", "LATICACIDVEN" in the last 168 hours.  No results found for this or any previous visit (from the past 240 hours).       Radiology Studies: No results found.      Scheduled Meds:  (feeding supplement) PROSource Plus  30 mL Oral BID BM   apixaban  5 mg Oral BID   bacitracin   Topical Daily   Chlorhexidine Gluconate Cloth  6 each Topical Q0600   darbepoetin (ARANESP) injection - DIALYSIS  150 mcg Subcutaneous Q Sun-1800   docusate sodium  100 mg Oral BID   gabapentin  100  mg Oral TID   Gerhardt's butt cream   Topical BID   hydrocortisone  25 mg Rectal BID   insulin aspart  0-5 Units Subcutaneous QHS   insulin aspart  0-9 Units Subcutaneous TID WC   insulin glargine-yfgn  6 Units Subcutaneous Daily   lactulose  20 g Oral Once   loratadine  10 mg Oral Daily   midodrine  10 mg Oral Q T,Th,Sa-HD   pantoprazole  40 mg Oral Daily   polyethylene glycol  17 g Oral Daily   povidone-Iodine   Topical Q0600   QUEtiapine  12.5 mg Oral q morning   QUEtiapine  25 mg Oral QHS   senna-docusate  2 tablet Oral BID   sevelamer carbonate  2,400 mg Oral TID WC   sodium zirconium cyclosilicate  10 g Oral TID   Continuous Infusions:  iron sucrose 100 mg (11/20/23 1505)          Glade Lloyd, MD [facility] 11/21/2023, 8:06 AM

## 2023-11-22 LAB — GLUCOSE, CAPILLARY
Glucose-Capillary: 135 mg/dL — ABNORMAL HIGH (ref 70–99)
Glucose-Capillary: 110 mg/dL — ABNORMAL HIGH (ref 70–99)
Glucose-Capillary: 157 mg/dL — ABNORMAL HIGH (ref 70–99)
Glucose-Capillary: 215 mg/dL — ABNORMAL HIGH (ref 70–99)

## 2023-11-22 MED ORDER — INSULIN GLARGINE 100 UNIT/ML ~~LOC~~ SOLN
6.0000 [IU] | Freq: Every day | SUBCUTANEOUS | Status: DC
  Administered 2023-11-23 – 2023-11-24 (×2): 6 [IU] via SUBCUTANEOUS
  Filled 2023-11-22 (×2): qty 0.06

## 2023-11-22 NOTE — Progress Notes (Signed)
 PROGRESS NOTE    [name]  ZOX:[id number] DOB: [birth year] DOA: 09/09/2023 PCP: [name], NP   Brief Narrative:  43 y.o. male with medical history significant of ESRD on hemodialysis (not fully compliant), insulin-dependent type 1 diabetes, gastroparesis, Left AKA, admitted to [facility] on 07/09/23 for DKA, Sepsis and hospital course significant for MV AND TV  MRSA endocarditis, followed by acute MCA stroke , left occipital and bilateral cerebellar strokes consistent with septic emboli, brain abscess from MRSA, left IJ DVT was transferred to [facility] for MV repair on 11/29.  While at [facility] he underwent cardiac cath showing non obstructive disease, craniotomy with abscess drainage on 09/01/23,. He had subarachnoid hemorrhage while on IV heparin, right third toe distal necrosis,was seen by podiatry, but family wanted conservative management. Since his tertiary care needs have been completed, he was transferred back to [facility] for further management.  PTOT  recommending SNF and awaiting placement.   Assessment & Plan:    MRSA TV/MV endocarditis: 10/12, blood cultures positive for MRSA. 10/13, TTE showed LVEF of 25-30% with concern for AV/MV abnormality  10/16 and 10/22, TEE showed MV/TV vegetations.  -Treated with IV vancomycin.  Per ID recommendation: complete 8 weeks of treatment.  Patient completed antibiotic treatment on 10/27/2023. Recommend outpatient follow up with ID.    History of hypertension Hypotension: -Blood pressure has remained very labile with intermittent episodes of hypotension and hypotension.  Currently on midodrine on dialysis days.  Monitor blood pressure.  Will hold off on resuming metoprolol for now.   ESRD on HD:  Nephrology on board.  Continue hemodialysis as per nephrology.  Hyperkalemia -Nephrology following.  Currently on Lokelma   Acute CVA /acute metabolic encephalopathy: Mri brain on 10/17 showed large right MCA infarct.  11/25, MRA head showed  significant for large necrotic appearing lesion within the right frontal lobe within area of the prior right MCA territory infarct with vasogenic edema and 8 mm leftward midline shift.  Neurology recommendations for Eliquis.  PT/OT recommendations for SNF. Repeat MRI brain done for increased confusion on 10/17/2023: Showed decreased size of area of abnormal diffusion restriction within the posterior right frontal operculum compatible with reported abscess. The amount of surrounding edema has also decreased -Neurology has already signed off: Outpatient follow-up with neurology.   Cerebral emboli with abscess/ Cerebritis -Secondary to to MRSA Endocarditis.  -S/p craniotomy and abscess drainage.  Repeat MRI on 10/17/2023 showed improvement   Type 1 DM uncontrolled with hyperglycemia and hypoglycemia: -Continue long-acting insulin.  Continue CBGs with SSI.  Carb modified diet.   Acute left upper extremity DVT: -Continue Eliquis.  -Left upper extremity venous duplex showed age-indeterminate DVT involving the left axillary vein.   Anemia of chronic kidney disease: -Hemoglobin currently stable.  Monitor intermittently   Right third toe necrosis: H/o prior left AKA: Physical deconditioning Per documentation, patient/family opted for conservative management and declined surgical management. Patient was seen by PT/OT with recommendation for SNF on discharge.   Prostate fluid collection: Urology evaluated patient at [facility], suggested that its a dilated seminal vesicle and recommended for no surgical intervention.    Mild cognitive dysfunction with behavioral changes.  Currently stable on seroquel.   Obesity class I -Outpatient follow-up  Pressure injury -Left buttock: Present on admission.  Continue local wound care as per nursing  Pancytopenia -No signs of bleeding.  Monitor intermittently  Hyponatremia -Mild.  Managed by hemodialysis by nephrology   DVT prophylaxis: Eliquis Code  Status: Full Family Communication: None at  bedside Disposition Plan: Status is: Inpatient Remains inpatient appropriate because: Of severity of illness.  Need for SNF placement.  Currently medically stable for discharge    Consultants: Nephrology/psychiatry/ID  Procedures: As above  Antimicrobials:  Anti-infectives (From admission, onward)    Start     Dose/Rate Route Frequency Ordered Stop   10/21/23 1230  vancomycin (VANCOCIN) 750 mg in sodium chloride 0.9 % 250 mL IVPB  Status:  Discontinued        750 mg 265 mL/hr over 60 Minutes Intravenous Every T-Th-Sa (Hemodialysis) 10/21/23 0804 10/21/23 0811   10/21/23 1200  vancomycin (VANCOCIN) 750 mg in sodium chloride 0.9 % 250 mL IVPB        750 mg 250 mL/hr over 60 Minutes Intravenous Every T-Th-Sa (Hemodialysis) 10/21/23 0811 10/27/23 1413   10/16/23 1345  vancomycin (VANCOCIN) 750 mg in sodium chloride 0.9 % 250 mL IVPB  Status:  Discontinued        750 mg 265 mL/hr over 60 Minutes Intravenous Every T-Th-Sa (Hemodialysis) 10/16/23 1257 10/21/23 0804   10/16/23 1200  vancomycin (VANCOREADY) IVPB 750 mg/150 mL  Status:  Discontinued        750 mg 150 mL/hr over 60 Minutes Intravenous Every T-Th-Sa (Hemodialysis) 10/14/23 1843 10/16/23 1257   10/14/23 1845  vancomycin (VANCOREADY) IVPB 500 mg/100 mL        500 mg 100 mL/hr over 60 Minutes Intravenous  Once 10/14/23 1843 10/15/23 0806   10/14/23 1200  vancomycin (VANCOREADY) IVPB 500 mg/100 mL  Status:  Discontinued        500 mg 100 mL/hr over 60 Minutes Intravenous Every T-Th-Sa (Hemodialysis) 10/13/23 1309 10/14/23 1843   10/13/23 1200  vancomycin (VANCOREADY) IVPB 500 mg/100 mL  Status:  Discontinued        500 mg 100 mL/hr over 60 Minutes Intravenous Every M-W-F (Hemodialysis) 10/11/23 1517 10/13/23 1309   10/12/23 1200  vancomycin (VANCOREADY) IVPB 500 mg/100 mL  Status:  Discontinued        500 mg 100 mL/hr over 60 Minutes Intravenous Every T-Th-Sa (Hemodialysis) 10/11/23  1514 10/11/23 1517   09/24/23 1200  vancomycin (VANCOREADY) IVPB 500 mg/100 mL  Status:  Discontinued        500 mg 100 mL/hr over 60 Minutes Intravenous Every M-W-F (Hemodialysis) 09/21/23 1246 10/11/23 1514   09/20/23 1200  vancomycin (VANCOCIN) 750 mg in sodium chloride 0.9 % 250 mL IVPB  Status:  Discontinued        750 mg 265 mL/hr over 60 Minutes Intravenous Every M-W-F (Hemodialysis) 09/20/23 0446 09/21/23 1246   09/20/23 1045  vancomycin (VANCOCIN) 750 mg in sodium chloride 0.9 % 250 mL IVPB        750 mg 265 mL/hr over 60 Minutes Intravenous  Once 09/20/23 0959 09/20/23 1226   09/20/23 0945  vancomycin (VANCOREADY) IVPB 750 mg/150 mL  Status:  Discontinued        750 mg 150 mL/hr over 60 Minutes Intravenous  Once 09/20/23 0855 09/20/23 0959   09/10/23 1200  vancomycin (VANCOREADY) IVPB 750 mg/150 mL  Status:  Discontinued        750 mg 150 mL/hr over 60 Minutes Intravenous Every M-W-F (Hemodialysis) 09/09/23 2030 09/20/23 0446   09/09/23 2200  linezolid (ZYVOX) tablet 600 mg  Status:  Discontinued        600 mg Oral Every 12 hours 09/09/23 2035 09/10/23 0946        Subjective: Patient seen and examined at bedside.  Poor historian.  No agitation, fever, vomiting reported.   Objective: Vitals:   11/20/23 1929 11/21/23 0501 11/21/23 0925 11/21/23 2045  BP: (!) 131/96 (!) 139/94 (!) 135/100 (!) 139/91  Pulse: (!) 121 (!) 107  (!) 106  Resp: 17 18 17 18   Temp:  (!) 97.4 F (36.3 C) 98.2 F (36.8 C) 97.8 F (36.6 C)  TempSrc:  Oral  Axillary  SpO2: (!) 87% 96%  98%  Weight:      Height:        Intake/Output Summary (Last 24 hours) at 11/22/2023 0726 Last data filed at 11/21/2023 1700 Gross per 24 hour  Intake 860 ml  Output 0 ml  Net 860 ml   Filed Weights   11/16/23 1826 11/18/23 0753 11/20/23 0840  Weight: 94.2 kg 94.9 kg 91.6 kg    Examination:  General: No acute distress.  Remains on room air.  Poor historian.  Chronically ill and deconditioned looking.   Flat affect.   respiratory: Bilateral decreased breath sounds at bases with scattered crackles CVS: Intermittently tachycardic; S1 and S2 are heard abdominal: Soft, obese, nontender, mildly distended; no organomegaly; normal bowel sounds heard  extremities: Left AKA present; mild right lower extremity edema present  Data Reviewed: I have personally reviewed following labs and imaging studies  CBC: Recent Labs  Lab 11/16/23 1417 11/17/23 0749  WBC 3.9* 3.5*  HGB 9.4* 10.4*  HCT 32.5* 35.1*  MCV 94.5 92.9  PLT 127* 115*   Basic Metabolic Panel: Recent Labs  Lab 11/16/23 1417 11/17/23 0749  NA 133* 134*  K 5.6* 5.3*  CL 93* 96*  CO2 26 25  GLUCOSE 360* 94  BUN 58* 34*  CREATININE 8.19* 5.38*  CALCIUM 9.3 9.4  PHOS 7.6*  --    GFR: Estimated Creatinine Clearance: 18.9 mL/min (A) (by C-G formula based on SCr of 5.38 mg/dL (H)). Liver Function Tests: No results for input(s): "AST", "ALT", "ALKPHOS", "BILITOT", "PROT", "ALBUMIN" in the last 168 hours.  No results for input(s): "LIPASE", "AMYLASE" in the last 168 hours. No results for input(s): "AMMONIA" in the last 168 hours. Coagulation Profile: No results for input(s): "INR", "PROTIME" in the last 168 hours. Cardiac Enzymes: No results for input(s): "CKTOTAL", "CKMB", "CKMBINDEX", "TROPONINI" in the last 168 hours. BNP (last 3 results) No results for input(s): "PROBNP" in the last 8760 hours. HbA1C: No results for input(s): "HGBA1C" in the last 72 hours. CBG: Recent Labs  Lab 11/20/23 2114 11/21/23 0921 11/21/23 1255 11/21/23 1720 11/21/23 2139  GLUCAP 163* 108* 180* 96 162*   Lipid Profile: No results for input(s): "CHOL", "HDL", "LDLCALC", "TRIG", "CHOLHDL", "LDLDIRECT" in the last 72 hours. Thyroid Function Tests: No results for input(s): "TSH", "T4TOTAL", "FREET4", "T3FREE", "THYROIDAB" in the last 72 hours. Anemia Panel: No results for input(s): "VITAMINB12", "FOLATE", "FERRITIN", "TIBC", "IRON",  "RETICCTPCT" in the last 72 hours. Sepsis Labs: No results for input(s): "PROCALCITON", "LATICACIDVEN" in the last 168 hours.  No results found for this or any previous visit (from the past 240 hours).       Radiology Studies: No results found.      Scheduled Meds:  (feeding supplement) PROSource Plus  30 mL Oral BID BM   apixaban  5 mg Oral BID   bacitracin   Topical Daily   Chlorhexidine Gluconate Cloth  6 each Topical Q0600   darbepoetin (ARANESP) injection - DIALYSIS  150 mcg Subcutaneous Q Sun-1800   docusate sodium  100 mg Oral BID   gabapentin  100 mg Oral  TID   Gerhardt's butt cream   Topical BID   hydrocortisone  25 mg Rectal BID   insulin aspart  0-5 Units Subcutaneous QHS   insulin aspart  0-9 Units Subcutaneous TID WC   insulin glargine-yfgn  6 Units Subcutaneous Daily   lactulose  20 g Oral Once   loratadine  10 mg Oral Daily   midodrine  10 mg Oral Q T,Th,Sa-HD   pantoprazole  40 mg Oral Daily   polyethylene glycol  17 g Oral Daily   povidone-Iodine   Topical Q0600   QUEtiapine  12.5 mg Oral q morning   QUEtiapine  25 mg Oral QHS   senna-docusate  2 tablet Oral BID   sevelamer carbonate  2,400 mg Oral TID WC   sodium zirconium cyclosilicate  10 g Oral TID   Continuous Infusions:  iron sucrose 100 mg (11/20/23 1505)          Glade Lloyd, MD [facility] 11/22/2023, 7:26 AM

## 2023-11-22 NOTE — Progress Notes (Signed)
 Physical Therapy Treatment Patient Details Name: [name] MRN: [id number] DOB: [birth year] Today's Date: 11/22/2023   History of Present Illness 43 y.o. male presents to [facility] 09/09/23 as a transfer from [facility] for further management of endocarditis. Pt originally was admitted to [facility] 07/09/23 for DKA and sepsis after being found on the ground. 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 10/17 pt had a R MCA, L occipital, and B cerebellum CVA w/ septic emboli. ICU admit 10/18 w/ intubation 10/18-10/24. Pt was transferred to [facility] for MV repair on 11/29. [facility], [facility] had cardiac cath w/ non obstructive disease, SAH, a craniotomy w/ abscess drainage on 12/4, and R third toe distal necrosis. PMH: chronic L AKA, MRSA, IDDM, ESRD on HD MWF, HTN, TEE positive for vegetation on multiple valves, cellulitis, medical noncompliance, narcotic dependence, DM with gastroparesis, L internal jugular DVT.    PT Comments  Pt seen for PT tx with pt agreeable, OT arriving at end of session with pt left in handoff to OT. Pt distracted throughout session, presenting with decreased initiation. Eventually able to initiate supine>sit but requests OT assist him with movement. Pt sits EOB & reaches outside of BOS with focus on core/trunk strengthening & static sitting balance. Offered to assist pt with STS attempt but pt declined. Pt left in care of OT for OT session. Will continue to follow pt acutely to progress mobility as able.    If plan is discharge home, recommend the following: Two people to help with walking and/or transfers;Assist for transportation;Supervision due to cognitive status;Help with stairs or ramp for entrance;Two people to help with bathing/dressing/bathroom;Assistance with cooking/housework;Direct supervision/assist for financial management;Direct supervision/assist for medications management   Can travel by private vehicle     No  Equipment Recommendations   Hoyer lift;Wheelchair (measurements PT);Wheelchair cushion (measurements PT);Other (comment)    Recommendations for Other Services Rehab consult     Precautions / Restrictions Precautions Precautions: Fall Precaution/Restrictions Comments: L AKA, L hemiparesis and edema, L neglect, bowel incontinence. L femoral HD cath Restrictions Weight Bearing Restrictions Per Provider Order: No Other Position/Activity Restrictions: Pt has sling for LUE when mobilizing OOB; Avoid using L prosthesis on HD catheter     Mobility  Bed Mobility Overal bed mobility: Needs Assistance       Supine to sit: Max assist, HOB elevated, Used rails (PT begins to assist pt with supine>sit but then pt elects for male OT to assist him.)          Transfers                        Ambulation/Gait                   Stairs             Wheelchair Mobility     Tilt Bed    Modified Rankin (Stroke Patients Only)       Balance Overall balance assessment: Needs assistance Sitting-balance support: Feet unsupported, No upper extremity supported Sitting balance-[name]: Fair                                      Communication    Cognition Arousal: Alert Behavior During Therapy: Flat affect   PT - Cognitive impairments: Problem solving, Safety/Judgement, Initiation  PT - Cognition Comments: Pt requires encouragement for participation, easily distracted throughout session Following commands: Impaired Following commands impaired: Only follows one step commands consistently, Follows one step commands with increased time, Follows multi-step commands inconsistently    Cueing    Exercises      General Comments        Pertinent Vitals/Pain Pain Assessment Pain Assessment: Faces Faces Pain Scale: No hurt    Home Living                          Prior Function            PT Goals (current goals can now be  found in the care plan section) Acute Rehab PT Goals Patient Stated Goal: "get out of the hospital"; wants his LUE to move better PT Goal Formulation: With patient Time For Goal Achievement: 11/29/23 Potential to Achieve Goals: Fair Progress towards PT goals: Progressing toward goals    Frequency    Min 1X/week      PT Plan      Co-evaluation              AM-PAC PT "6 Clicks" Mobility   Outcome Measure  Help needed turning from your back to your side while in a flat bed without using bedrails?: A Lot Help needed moving from lying on your back to sitting on the side of a flat bed without using bedrails?: Total Help needed moving to and from a bed to a chair (including a wheelchair)?: Total Help needed standing up from a chair using your arms (e.g., wheelchair or bedside chair)?: Total Help needed to walk in hospital room?: Total   6 Click Score: 6    End of Session   Activity Tolerance: Patient tolerated treatment well Patient left:  (sitting EOB in care of OT)   PT Visit Diagnosis: Other abnormalities of gait and mobility (R26.89);Other symptoms and signs involving the nervous system (R29.898);Hemiplegia and hemiparesis;Muscle weakness (generalized) (M62.81) Hemiplegia - Right/Left: Left Hemiplegia - dominant/non-dominant: Non-dominant Hemiplegia - caused by: Cerebral infarction     Time: 3086-5784 PT Time Calculation (min) (ACUTE ONLY): 14 min  Charges:    $Therapeutic Activity: 8-22 mins PT General Charges $$ ACUTE PT VISIT: 1 Visit                     [name], PT, DPT 11/22/23, 3:07 PM   [name] 11/22/2023, 3:06 PM

## 2023-11-22 NOTE — Progress Notes (Addendum)
 PT Cancellation Note  Patient Details Name: [name] MRN: [id number] DOB: [birth year]   Cancelled Treatment:    Reason Eval/Treat Not Completed: Other (comment) PT stopped by pt's room to let pt know therapy is planning to come work with him in about 30 minutes. Pt reports he cannot work right now 2/2 just finding out bad news from the Child psychotherapist, declined therapist coming back in 30 minutes. Will f/u as able.  Addendum: Per OT, pt just refused participation in OT at 1:15pm. Will f/u as able.  Aleda Grana, PT, DPT 11/22/23, 1:18 PM   Sandi Mariscal 11/22/2023, 11:26 AM

## 2023-11-22 NOTE — Progress Notes (Signed)
 OT Cancellation Note  Patient Details Name: [name] MRN: [id number] DOB: 11-28-1980   Cancelled Treatment:    Reason Eval/Treat Not Completed: Patient declined, no reason specified (Patient stating he was not emotionally ready to participate due to not being discharged to another facility. OT to attempt later as schedule permits.) Alfonse Flavors, OTA Acute Rehabilitation Services  Office [phone]  Dewain Penning 11/22/2023, 1:22 PM

## 2023-11-22 NOTE — Progress Notes (Signed)
 Occupational Therapy Treatment Patient Details Name: [name] MRN: [id number] DOB: 06/19/1981 Today's Date: 11/22/2023   History of present illness 43 y.o. male presents to [facility] 09/09/23 as a transfer from [facility] for further management of endocarditis. Pt originally was admitted to [facility] 07/09/23 for DKA and sepsis after being found on the ground. 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 10/17 pt had a R MCA, L occipital, and B cerebellum CVA w/ septic emboli. ICU admit 10/18 w/ intubation 10/18-10/24. Pt was transferred to [facility] for MV repair on 11/29. [facility], [facility] had cardiac cath w/ non obstructive disease, SAH, a craniotomy w/ abscess drainage on 12/4, and R third toe distal necrosis. PMH: chronic L AKA, MRSA, IDDM, ESRD on HD MWF, HTN, TEE positive for vegetation on multiple valves, cellulitis, medical noncompliance, narcotic dependence, DM with gastroparesis, L internal jugular DVT.   OT comments  Patient with PT upon entry attempting to get to EOB. Patient asking COTA to assist due to male therapist. Patient performed seated balance activities progressing from min assist to CGA for balance. LUE PROM performed while seated on EOB. Patient assisted back to supine and performed rolling to change linen. Patient will benefit from continued inpatient follow up therapy, <3 hours/day. Acute OT to continue to follow to address established goals to facilitate DC to next venue of care.       If plan is discharge home, recommend the following:  Two people to help with walking and/or transfers;A lot of help with bathing/dressing/bathroom;Assistance with cooking/housework;Direct supervision/assist for medications management;Direct supervision/assist for financial management;Assist for transportation;Help with stairs or ramp for entrance;Supervision due to cognitive status   Equipment Recommendations  Other (comment) (defer to next venue of care)     Recommendations for Other Services      Precautions / Restrictions Precautions Precautions: Fall Precaution/Restrictions Comments: L AKA, L hemiparesis and edema, L neglect, bowel incontinence. L femoral HD cath Restrictions Weight Bearing Restrictions Per Provider Order: No Other Position/Activity Restrictions: Pt has sling for LUE when mobilizing OOB; Avoid using L prosthesis on HD catheter       Mobility Bed Mobility   Bed Mobility: Rolling, Supine to Sit, Sit to Supine Rolling: Mod assist, Used rails   Supine to sit: Max assist, HOB elevated, Used rails (PT working with patient upon arrival on getting to EOB but asked COTA, male, to assist) Sit to supine: Min assist   General bed mobility comments: performed rolling for changing bed linen    Transfers Overall transfer level: Needs assistance                       Balance       Sitting balance - Comments: min assist but progress to CGA                                   ADL either performed or assessed with clinical judgement   ADL Overall ADL's : Needs assistance/impaired                                       General ADL Comments: Patient declined self care tasks    Extremity/Trunk Assessment              Vision  Perception     Praxis     Communication     Cognition Arousal: Alert Behavior During Therapy: Flat affect Cognition: Cognition impaired     Awareness: Intellectual awareness intact, Online awareness impaired Memory impairment (select all impairments): Working [employer] fast streamer, Short-term memory Attention impairment (select first level of impairment): Selective attention Executive functioning impairment (select all impairments): Initiation, Organization, Sequencing, Reasoning, Problem solving                   Following commands: Impaired Following commands impaired: Only follows one step commands consistently, Follows one step commands with  increased time, Follows multi-step commands inconsistently      Cueing   Cueing Techniques: Verbal cues, Tactile cues  Exercises Exercises: General Upper Extremity General Exercises - Upper Extremity Shoulder Flexion: PROM, Left, 5 reps, Seated Shoulder Extension: PROM, Left, 5 reps, Seated Elbow Flexion: PROM, Left, 5 reps, Seated Elbow Extension: PROM, Left, 5 reps, Seated Wrist Flexion: PROM, Left, 10 reps, Seated Wrist Extension: PROM, Left, 10 reps, Seated Digit Composite Flexion: PROM, Left, 10 reps, Seated Composite Extension: PROM, Left, 10 reps, Seated    Shoulder Instructions       General Comments VSS on RA    Pertinent Vitals/ Pain       Pain Assessment Pain Assessment: Faces Faces Pain Scale: No hurt Pain Intervention(s): Monitored during session  Home Living                                          Prior Functioning/Environment              Frequency  Min 1X/week        Progress Toward Goals  OT Goals(current goals can now be found in the care plan section)  Progress towards OT goals: Progressing toward goals  Acute Rehab OT Goals Patient Stated Goal: to go to SNF for more rehab OT Goal Formulation: With patient Time For Goal Achievement: 11/24/23 Potential to Achieve Goals: Good ADL Goals Pt Will Perform Eating: with modified independence;sitting Pt Will Perform Grooming: with modified independence;sitting Pt Will Perform Lower Body Bathing: with min assist;bed level Pt Will Perform Lower Body Dressing: with mod assist;bed level;with adaptive equipment Pt Will Transfer to Toilet:  (bed level change of brief) Pt Will Perform Toileting - Clothing Manipulation and hygiene: with mod assist;sitting/lateral leans Pt/caregiver will Perform Home Exercise Program: Left upper extremity;Increased ROM;Right Upper extremity;Increased strength;With Supervision Additional ADL Goal #1: Patient will demonstrate ability to follow 2-step  instructions appropriately in 8/10 attempts. Additional ADL Goal #2: pt will demonstrate bed Rolling transfer with hooking R UE 100% of attempts Additional ADL Goal #3: Patient will demonstrate ability to sit EOB with Fair balance for 3 or more minutes during a functional or therapeutic task. Additional ADL Goal #4: Patient will demonstrate ability to doff/donn R post-op shoe with Supervision.  Plan      Co-evaluation                 AM-PAC OT "6 Clicks" Daily Activity     Outcome Measure   Help from another person eating meals?: A Little Help from another person taking care of personal grooming?: A Little Help from another person toileting, which includes using toliet, bedpan, or urinal?: A Lot Help from another person bathing (including washing, rinsing, drying)?: A Lot Help from another person to put on and taking off  regular upper body clothing?: A Little Help from another person to put on and taking off regular lower body clothing?: A Lot 6 Click Score: 15    End of Session    OT Visit Diagnosis: Other symptoms and signs involving cognitive function;Hemiplegia and hemiparesis Hemiplegia - Right/Left: Left Hemiplegia - dominant/non-dominant: Non-Dominant Hemiplegia - caused by: Cerebral infarction   Activity Tolerance Patient tolerated treatment well   Patient Left in bed;with call bell/phone within reach;with bed alarm set   Nurse Communication Mobility status        Time: 1430-1455 OT Time Calculation (min): 25 min  Charges: OT General Charges $OT Visit: 1 Visit OT Treatments $Self Care/Home Management : 8-22 mins $Therapeutic Exercise: 8-22 mins  Alfonse Flavors, OTA Acute Rehabilitation Services  Office [phone]   Dewain Penning 11/22/2023, 3:22 PM

## 2023-11-22 NOTE — Progress Notes (Addendum)
 1630 Per pharmacist and lokelma package instructions, medication is to be mixed with WATER ONLY. Patient is refusing to take medication if not in juice/soda. Explained to patient that water is the only option due to absorption/effectiveness. Patient continues to refuse. MD notified and aware.    1655: Patient now agreeable to take medication; drank most of lokelma.

## 2023-11-22 NOTE — TOC Progression Note (Addendum)
 Transition of Care [facility]) - Progression Note    Patient Details  Name: [name] MRN: [id number] Date of Birth: 1980/11/09  Transition of Care [facility]) CM/SW Contact  Michaela Corner, [place] Phone Number: 11/22/2023, 11:22 AM  Clinical Narrative:   Renal Navigator informed CSW that [facility]) or [facility]) are at capacity for HD. CSW spoke with [name] from [place] and she stated those are the ony two facilities they can provide transportation to.   CSW reached out to other accepting bed offers ([facility] and [facility]) to inquire about the HD centers they can transport pt to. CSW spoke with pt and discussed [facility] cannot provide transportation to any other HD centers; and to review other bed offers. Pt has chosen [name] at this time. Per [facility] they can transport pt to [facility] in [name] or [place], CSW updated Renal Navigator.   CSW will look into switching current approved auth for [facility] to [facility].  12:11 PM CSW contacted Monia Pouch and updated ins auth to [facility]. CSW provided Eunice Blase with auth reference # [id number].   1:43 PM CSW received message from PT stating pt did not recall picking another SNF and asked for CSW to reach out to him.   CSW called pt and he did not recall the conversation from 11:22 AM where he picked another facility. CSW explained that we discussed other bed offers this morning and that he chose [name].   CSW called pts mother, [name], and informed her of dc plan at this time.   TOC will continue to follow.   Expected Discharge Plan: Skilled Nursing Facility Barriers to Discharge: Continued Medical Work up  Expected Discharge Plan and Services                                               Social Determinants of Health (SDOH) Interventions SDOH Screenings   Food Insecurity: No Food Insecurity (09/09/2023)  Housing: Unknown (09/09/2023)  Transportation Needs: No Transportation Needs (09/09/2023)   Utilities: Not At Risk (09/09/2023)  Alcohol Screen: Low Risk  (02/15/2023)  Depression (PHQ2-9): Low Risk  (02/15/2023)  Financial Resource Strain: Low Risk  (02/15/2023)  Physical Activity: Inactive (02/15/2023)  Social Connections: Socially Integrated (02/15/2023)  Stress: No Stress Concern Present (02/15/2023)  Tobacco Use: Low Risk  (09/12/2023)  Recent Concern: Tobacco Use - Medium Risk (09/01/2023)   Received from [facility]    Readmission Risk Interventions     No data to display

## 2023-11-22 NOTE — Progress Notes (Addendum)
 [facility] Progress Note   Subjective: Unfortunately SNF plans fell through-[facility] only transport to [facility] or [facility] and either unit has available chair.  HD tomorrow on schedule.   Objective Vitals:   11/21/23 0501 11/21/23 0925 11/21/23 2045 11/22/23 0743  BP: (!) 139/94 (!) 135/100 (!) 139/91 (!) 130/94  Pulse: (!) 107  (!) 106 (!) 128  Resp: 18 17 18 18   Temp: (!) 97.4 F (36.3 C) 98.2 F (36.8 C) 97.8 F (36.6 C)   TempSrc: Oral  Axillary   SpO2: 96%  98%   Weight:      Height:       General: Alert, chronically ill male NAD Heart: RRR no MRG Lungs: CTA bilaterally RA, unlabored Abdomen: NABS, soft NT ND, trace abdominal edema Extremities: Left AKA no stump edema, RLE no edema. LUE edematous Dialysis Access: L FEM TDC dressing dry clear    Additional Objective Labs: Basic Metabolic Panel: Recent Labs  Lab 11/16/23 1417 11/17/23 0749  NA 133* 134*  K 5.6* 5.3*  CL 93* 96*  CO2 26 25  GLUCOSE 360* 94  BUN 58* 34*  CREATININE 8.19* 5.38*  CALCIUM 9.3 9.4  PHOS 7.6*  --    Liver Function Tests: No results for input(s): "AST", "ALT", "ALKPHOS", "BILITOT", "PROT", "ALBUMIN" in the last 168 hours. No results for input(s): "LIPASE", "AMYLASE" in the last 168 hours. CBC: Recent Labs  Lab 11/16/23 1417 11/17/23 0749  WBC 3.9* 3.5*  HGB 9.4* 10.4*  HCT 32.5* 35.1*  MCV 94.5 92.9  PLT 127* 115*   Blood Culture    Component Value Date/Time   SDES BLOOD SITE NOT SPECIFIED 08/25/2023 1706   SPECREQUEST  08/25/2023 1706    BOTTLES DRAWN AEROBIC ONLY Blood Culture results may not be optimal due to an inadequate volume of blood received in culture bottles   CULT  08/25/2023 1706    NO GROWTH 5 DAYS Performed at [facility], [address] [place], [name] [address]    REPTSTATUS 08/30/2023 FINAL 08/25/2023 1706    Cardiac Enzymes: No results for input(s): "CKTOTAL", "CKMB", "CKMBINDEX", "TROPONINI" in the last 168  hours. CBG: Recent Labs  Lab 11/21/23 1255 11/21/23 1720 11/21/23 2139 11/22/23 0740 11/22/23 1148  GLUCAP 180* 96 162* 135* 110*   Iron Studies: No results for input(s): "IRON", "TIBC", "TRANSFERRIN", "FERRITIN" in the last 72 hours. @lablastinr3 @ Studies/Results: No results found. Medications:  iron sucrose 100 mg (11/20/23 1505)    (feeding supplement) PROSource Plus  30 mL Oral BID BM   apixaban  5 mg Oral BID   bacitracin   Topical Daily   Chlorhexidine Gluconate Cloth  6 each Topical Q0600   darbepoetin (ARANESP) injection - DIALYSIS  150 mcg Subcutaneous Q Sun-1800   docusate sodium  100 mg Oral BID   gabapentin  100 mg Oral TID   Gerhardt's butt cream   Topical BID   hydrocortisone  25 mg Rectal BID   insulin aspart  0-5 Units Subcutaneous QHS   insulin aspart  0-9 Units Subcutaneous TID WC   insulin glargine-yfgn  6 Units Subcutaneous Daily   lactulose  20 g Oral Once   loratadine  10 mg Oral Daily   midodrine  10 mg Oral Q T,Th,Sa-HD   pantoprazole  40 mg Oral Daily   polyethylene glycol  17 g Oral Daily   povidone-Iodine   Topical Q0600   QUEtiapine  12.5 mg Oral q morning   QUEtiapine  25  mg Oral QHS   senna-docusate  2 tablet Oral BID   sevelamer carbonate  2,400 mg Oral TID WC   sodium zirconium cyclosilicate  10 g Oral TID     Dialysis Orders:   MWF - [facility] -> may be will be going to [place] TTS on discharge 4hr, 400/A1.5, EDW 77kg, L femoral TDC, 2K/3Ca bath, no heparin   Assessment/Plan: 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 Completed on extensive IV Vanc course on 1/29. S/p transfer to [facility] and drainage 08/31/24. Repeat brain MRI 10/17/23 showed improvement. Acute B CVA/septic emboli- due to above Acute LUE DVT: On Eliquis ESRD: Transitioned to TTS. Next HD 11/23/2023 HTN/volume: CT 10/09/23 with diffuse anasarca.  Significantly above EDW but using bed weights. Max UF with HD. On Midodrine with HD and okay to use IV Albumin and extra Midodrine dose PRN. Enforcing fluid restrictions/compliance with full HD time. Appears he may be gaining body weight more so than fluid. He reports eating chick-fil-a everyday. 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 On Aranesp to . Iron studies from 2/6 include: Iron 31, Tsat 15%, and Ferritin 140. On Fe load X 10 doses with HD-1st dose 2/8. Secondary HPTH:  CCA ^, Continue to hold calcitriol for now. Sevelamer  ha been ^ to 3/meals.  Continues to have outside food and refuses any diet except regular (chronic problem with him despite counseling).phos 6.3 . Hyperkalemia: K+ 5.3. Continue Lokelma 10g TID for now, using low bath intermittently with HD.  As noted above diet indiscretion despite counseling. Checking labs in AM. Prostate fluid collection/abscess = noted per admit team urology consulted at outside hospital per their assessment "fluid collection was 2/2 dilated seminal vesicle with no surg.  Intervention recommended/ID concern for possible abscess patient covered with Vanc. and recommendation for outpatient urology follow-up" Nutrition: Alb 3.4,  continue supplements.  T1DM: per primary Dispo: Insurance denied [facility] because HD units to which they transport will not accept pt.     Rita H. Brown NP-C 11/22/2023, 1:31 PM  [facility] [phone]   Patient seen today by Alonna Buckler, NP as above.  Agree with her note.    Estanislado Emms, MD 6:19 PM 11/22/2023

## 2023-11-22 NOTE — Progress Notes (Addendum)
 Pt's case discussed with CSW. Will assist as needed.   Olivia Canter Renal Navigator [phone]  Addendum at 3:00 pm: Discussed pt's case with [facility] staff to see if [facility] would be an option while pt at snf. Advised clinic can be requested if chairs available. Referral submitted to [facility] admissions this afternoon with request for [facility] while pt at snf. Will await determination.

## 2023-11-22 NOTE — Progress Notes (Signed)
 Patient refusing lab work "until tomorrow" Patient also refused morning dose of lokelma. States he "may take it later" MD notified and aware.

## 2023-11-23 LAB — RENAL FUNCTION PANEL
Albumin: 3.4 g/dL — ABNORMAL LOW (ref 3.5–5.0)
Anion gap: 12 (ref 5–15)
BUN: 63 mg/dL — ABNORMAL HIGH (ref 6–20)
CO2: 27 mmol/L (ref 22–32)
Calcium: 8.9 mg/dL (ref 8.9–10.3)
Chloride: 98 mmol/L (ref 98–111)
Creatinine, Ser: 8.09 mg/dL — ABNORMAL HIGH (ref 0.61–1.24)
GFR, Estimated: 8 mL/min — ABNORMAL LOW (ref >60)
Glucose, Bld: 72 mg/dL (ref 70–99)
Phosphorus: 8.8 mg/dL — ABNORMAL HIGH (ref 2.5–4.6)
Potassium: 5.8 mmol/L — ABNORMAL HIGH (ref 3.5–5.1)
Sodium: 137 mmol/L (ref 135–145)

## 2023-11-23 LAB — GLUCOSE, CAPILLARY
Glucose-Capillary: 164 mg/dL — ABNORMAL HIGH (ref 70–99)
Glucose-Capillary: 151 mg/dL — ABNORMAL HIGH (ref 70–99)
Glucose-Capillary: 79 mg/dL (ref 70–99)

## 2023-11-23 LAB — CBC WITH DIFFERENTIAL/PLATELET
Abs Immature Granulocytes: 0.01 10*3/uL (ref 0.00–0.07)
Basophils Absolute: 0 10*3/uL (ref 0.0–0.1)
Basophils Relative: 1 %
Eosinophils Absolute: 0.3 10*3/uL (ref 0.0–0.5)
Eosinophils Relative: 6 %
HCT: 33.8 % — ABNORMAL LOW (ref 39.0–52.0)
Hemoglobin: 9.9 g/dL — ABNORMAL LOW (ref 13.0–17.0)
Immature Granulocytes: 0 %
Lymphocytes Relative: 22 %
Lymphs Abs: 0.9 10*3/uL (ref 0.7–4.0)
MCH: 27.9 pg (ref 26.0–34.0)
MCHC: 29.3 g/dL — ABNORMAL LOW (ref 30.0–36.0)
MCV: 95.2 fL (ref 80.0–100.0)
Monocytes Absolute: 0.4 10*3/uL (ref 0.1–1.0)
Monocytes Relative: 9 %
Neutro Abs: 2.5 10*3/uL (ref 1.7–7.7)
Neutrophils Relative %: 62 %
Platelets: 131 10*3/uL — ABNORMAL LOW (ref 150–400)
RBC: 3.55 MIL/uL — ABNORMAL LOW (ref 4.22–5.81)
RDW: 16.6 % — ABNORMAL HIGH (ref 11.5–15.5)
WBC: 4 10*3/uL (ref 4.0–10.5)
nRBC: 0 % (ref 0.0–0.2)

## 2023-11-23 MED ORDER — HEPARIN SODIUM (PORCINE) 1000 UNIT/ML IJ SOLN
4600.0000 [IU] | Freq: Once | INTRAMUSCULAR | Status: AC
  Administered 2023-11-23: 4600 [IU]
  Filled 2023-11-23: qty 5

## 2023-11-23 MED ORDER — LIDOCAINE-PRILOCAINE 2.5-2.5 % EX CREA: 1.0000 | TOPICAL_CREAM | CUTANEOUS | Status: DC | PRN

## 2023-11-23 MED ORDER — LIDOCAINE HCL (PF) 1 % IJ SOLN: 5.0000 mL | INTRAMUSCULAR | Status: DC | PRN

## 2023-11-23 MED ORDER — ALBUMIN HUMAN 25 % IV SOLN
25.0000 g | Freq: Once | INTRAVENOUS | Status: AC | PRN
  Administered 2023-11-23: 25 g via INTRAVENOUS
  Filled 2023-11-23: qty 100

## 2023-11-23 MED ORDER — ALTEPLASE 2 MG IJ SOLR: 2.0000 mg | Freq: Once | INTRAMUSCULAR | Status: DC | PRN

## 2023-11-23 MED ORDER — HEPARIN SODIUM (PORCINE) 1000 UNIT/ML DIALYSIS: 1000.0000 [IU] | INTRAMUSCULAR | Status: DC | PRN

## 2023-11-23 MED ORDER — ANTICOAGULANT SODIUM CITRATE 4% (200MG/5ML) IV SOLN: 5.0000 mL | Status: DC | PRN

## 2023-11-23 MED ORDER — PENTAFLUOROPROP-TETRAFLUOROETH EX AERO: 1.0000 | INHALATION_SPRAY | CUTANEOUS | Status: DC | PRN

## 2023-11-23 NOTE — Progress Notes (Addendum)
 Subjective: Seen in room, sleeping.  Possible SNF placement in [facility]. Appreciate assistance from MSW/Renal navigator. HD later today.     Objective Vitals:   11/21/23 2045 11/22/23 0743 11/22/23 2141 11/23/23 0509  BP: (!) 139/91 (!) 130/94 123/84 117/83  Pulse: (!) 106 (!) 128 (!) 123 (!) 124  Resp: 18 18 18 18   Temp:  98 F (36.7 C)  98.1 F (36.7 C)  TempSrc: Axillary     SpO2: 98%  90% 100%  Weight:      Height:       General: Alert, chronically ill male NAD Heart: RRR no MRG Lungs: CTA bilaterally RA, unlabored Abdomen: NABS, soft NT ND, trace abdominal edema Extremities: Left AKA no stump edema, RLE no edema. LUE edematous Dialysis Access: L FEM TDC dressing dry clear   Additional Objective Labs: Basic Metabolic Panel: Recent Labs  Lab 11/16/23 1417 11/17/23 0749  NA 133* 134*  K 5.6* 5.3*  CL 93* 96*  CO2 26 25  GLUCOSE 360* 94  BUN 58* 34*  CREATININE 8.19* 5.38*  CALCIUM 9.3 9.4  PHOS 7.6*  --    Liver Function Tests: No results for input(s): "AST", "ALT", "ALKPHOS", "BILITOT", "PROT", "ALBUMIN" in the last 168 hours. No results for input(s): "LIPASE", "AMYLASE" in the last 168 hours. CBC: Recent Labs  Lab 11/16/23 1417 11/17/23 0749  WBC 3.9* 3.5*  HGB 9.4* 10.4*  HCT 32.5* 35.1*  MCV 94.5 92.9  PLT 127* 115*   Blood Culture    Component Value Date/Time   SDES BLOOD SITE NOT SPECIFIED 08/25/2023 1706   SPECREQUEST  08/25/2023 1706    BOTTLES DRAWN AEROBIC ONLY Blood Culture results may not be optimal due to an inadequate volume of blood received in culture bottles   CULT  08/25/2023 1706    NO GROWTH 5 DAYS Performed at [facility], [address], [place], [name] [address]    REPTSTATUS 08/30/2023 FINAL 08/25/2023 1706    Cardiac Enzymes: No results for input(s): "CKTOTAL", "CKMB", "CKMBINDEX", "TROPONINI" in the last 168 hours. CBG: Recent Labs  Lab 11/22/23 0740 11/22/23 1148 11/22/23 1648 11/22/23 2139  11/23/23 0745  GLUCAP 135* 110* 157* 215* 164*   Iron Studies: No results for input(s): "IRON", "TIBC", "TRANSFERRIN", "FERRITIN" in the last 72 hours. @lablastinr3 @ Studies/Results: No results found. Medications:  anticoagulant sodium citrate     iron sucrose 100 mg (11/20/23 1505)    (feeding supplement) PROSource Plus  30 mL Oral BID BM   apixaban  5 mg Oral BID   Chlorhexidine Gluconate Cloth  6 each Topical Q0600   darbepoetin (ARANESP) injection - DIALYSIS  150 mcg Subcutaneous Q Sun-1800   docusate sodium  100 mg Oral BID   gabapentin  100 mg Oral TID   Gerhardt's butt cream   Topical BID   hydrocortisone  25 mg Rectal BID   insulin aspart  0-5 Units Subcutaneous QHS   insulin aspart  0-9 Units Subcutaneous TID WC   insulin glargine  6 Units Subcutaneous Daily   lactulose  20 g Oral Once   loratadine  10 mg Oral Daily   midodrine  10 mg Oral Q T,Th,Sa-HD   pantoprazole  40 mg Oral Daily   polyethylene glycol  17 g Oral Daily   povidone-Iodine   Topical Q0600   QUEtiapine  12.5 mg Oral q morning   QUEtiapine  25 mg Oral QHS   senna-docusate  2 tablet Oral BID   sevelamer carbonate  2,400 mg Oral TID WC   sodium zirconium cyclosilicate  10 g Oral TID     Dialysis Orders:   MWF - [facility] -> may be will be going to [place] TTS on discharge 4hr, 400/A1.5, EDW 77kg, L femoral TDC, 2K/3Ca bath, no heparin   Assessment/Plan: 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 Completed on extensive IV Vanc course on 1/29. S/p transfer to [facility] and drainage 08/31/24. Repeat brain MRI 10/17/23 showed improvement. Acute B CVA/septic emboli- due to above Acute LUE DVT: On Eliquis ESRD: Transitioned to TTS. Next HD 11/23/2023 HTN/volume: CT 10/09/23 with diffuse anasarca. Significantly above EDW but using bed weights. Max UF with HD. On Midodrine with HD and okay to  use IV Albumin and extra Midodrine dose PRN. Enforcing fluid restrictions/compliance with full HD time. Appears he may be gaining body weight more so than fluid. He reports eating chick-fil-a everyday. 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 On Aranesp to . Iron studies from 2/6 include: Iron 31, Tsat 15%, and Ferritin 140. On Fe load X 10 doses with HD-1st dose 2/8. Secondary HPTH:  CCA ^, Continue to hold calcitriol for now. Sevelamer  ha been ^ to 3/meals.  Continues to have outside food and refuses any diet except regular (chronic problem with him despite counseling).phos 6.3 . Hyperkalemia: K+ 5.3. Continue Lokelma 10g TID for now, using low bath intermittently with HD.  As noted above diet indiscretion despite counseling. Checking labs in AM. Prostate fluid collection/abscess = noted per admit team urology consulted at outside hospital per their assessment "fluid collection was 2/2 dilated seminal vesicle with no surg.  Intervention recommended/ID concern for possible abscess patient covered with Vanc. and recommendation for outpatient urology follow-up" Nutrition: Alb 3.4,  continue supplements.  T1DM: per primary Dispo: Insurance denied [facility] not an option because HD units to which they transport will not accept pt. Now possible placement in [facility].     Rita H. Brown NP-C 11/23/2023, 11:25 AM  [employer] [phone]   Seen and examined independently.  Agree with note and exam as documented above by physician extender and as noted here.  Seen and examined on dialysis.  Blood pressure 103/58.  Procedure supervised.  Tolerating goal.    Estanislado Emms, MD 11/23/2023  3:50 PM   Addendum: Needs a low potassium diet if he will agree to it.   Estanislado Emms, MD 5:09 PM 11/23/2023

## 2023-11-23 NOTE — Progress Notes (Addendum)
 Pt's referral to [facility] is currently pending. Will provide update to staff and pt once determination is known. Will assist as needed.   Olivia Canter Renal Navigator [phone]  Addendum at 3:51 pm: Pt has been accepted at [facility] on TTS 7:05 am chair time. Pt can start on Thursday and will need to arrive at 6:50 am . This info provided to CSW who confirmed with snf that they can provide transportation on those days/times. Update provided to renal NP as well. Pt currently in HD. Will provide info to pt tomorrow am.

## 2023-11-23 NOTE — TOC Progression Note (Signed)
 Transition of Care [facility]) - Progression Note    Patient Details  Name: [name] MRN: [id number] Date of Birth: 1981-09-28  Transition of Care [facility]) CM/SW Contact  Baldemar Lenis, [name] Phone Number: 11/23/2023, 3:50 PM  Clinical Narrative:   CSW updated by Renal Navigator that patient has been accepted for TTS at [facility] with 7:05 am chair time. CSW sent information to Admissions with [facility], confirmed that they are able to transport patient for those times. [facility] can provide transport, able to admit patient tomorrow pending medical stability. CSW attempted to update patient, he is off unit at HD. Per HD nurse, he is sleeping at this time. CSW to follow.    Expected Discharge Plan: Skilled Nursing Facility Barriers to Discharge: Continued Medical Work up  Expected Discharge Plan and Services                                               Social Determinants of Health (SDOH) Interventions SDOH Screenings   Food Insecurity: No Food Insecurity (09/09/2023)  Housing: Unknown (09/09/2023)  Transportation Needs: No Transportation Needs (09/09/2023)  Utilities: Not At Risk (09/09/2023)  Alcohol Screen: Low Risk  (02/15/2023)  Depression (PHQ2-9): Low Risk  (02/15/2023)  Financial Resource Strain: Low Risk  (02/15/2023)  Physical Activity: Inactive (02/15/2023)  Social Connections: Socially Integrated (02/15/2023)  Stress: No Stress Concern Present (02/15/2023)  Tobacco Use: Low Risk  (09/12/2023)  Recent Concern: Tobacco Use - Medium Risk (09/01/2023)   Received from [facility]    Readmission Risk Interventions     No data to display

## 2023-11-23 NOTE — Progress Notes (Signed)
 PROGRESS NOTE    [name]  ZOX:[id number] DOB: [birth year] DOA: 09/09/2023 PCP: [name], NP   Brief Narrative:  43 y.o. male with medical history significant of ESRD on hemodialysis (not fully compliant), insulin-dependent type 1 diabetes, gastroparesis, Left AKA, admitted to [facility] on 07/09/23 for DKA, Sepsis and hospital course significant for MV AND TV  MRSA endocarditis, followed by acute MCA stroke , left occipital and bilateral cerebellar strokes consistent with septic emboli, brain abscess from MRSA, left IJ DVT was transferred to [facility] for MV repair on 11/29.  While at [facility] he underwent cardiac cath showing non obstructive disease, craniotomy with abscess drainage on 09/01/23,. He had subarachnoid hemorrhage while on IV heparin, right third toe distal necrosis,was seen by podiatry, but family wanted conservative management. Since his tertiary care needs have been completed, he was transferred back to [facility] for further management.  PTOT  recommending SNF and awaiting placement.   Assessment & Plan:    MRSA TV/MV endocarditis: 10/12, blood cultures positive for MRSA. 10/13, TTE showed LVEF of 25-30% with concern for AV/MV abnormality  10/16 and 10/22, TEE showed MV/TV vegetations.  -Treated with IV vancomycin.  Per ID recommendation: complete 8 weeks of treatment.  Patient completed antibiotic treatment on 10/27/2023. Recommend outpatient follow up with ID.    History of hypertension Hypotension: -Blood pressure has remained very labile with intermittent episodes of hypotension and hypotension.  Currently on midodrine on dialysis days.  Monitor blood pressure.  Will hold off on resuming metoprolol for now.   ESRD on HD:  Nephrology on board.  Continue hemodialysis as per nephrology.  Hyperkalemia -Nephrology following.  Currently on Lokelma.  Labs pending today   Acute CVA /acute metabolic encephalopathy: Mri brain on 10/17 showed large right MCA infarct.  11/25,  MRA head showed significant for large necrotic appearing lesion within the right frontal lobe within area of the prior right MCA territory infarct with vasogenic edema and 8 mm leftward midline shift.  Neurology recommendations for Eliquis.  PT/OT recommendations for SNF. Repeat MRI brain done for increased confusion on 10/17/2023: Showed decreased size of area of abnormal diffusion restriction within the posterior right frontal operculum compatible with reported abscess. The amount of surrounding edema has also decreased -Neurology has already signed off: Outpatient follow-up with neurology.   Cerebral emboli with abscess/ Cerebritis -Secondary to to MRSA Endocarditis.  -S/p craniotomy and abscess drainage.  Repeat MRI on 10/17/2023 showed improvement   Type 1 DM uncontrolled with hyperglycemia and hypoglycemia: -Continue long-acting insulin.  Continue CBGs with SSI.  Carb modified diet.   Acute left upper extremity DVT: -Continue Eliquis.  -Left upper extremity venous duplex showed age-indeterminate DVT involving the left axillary vein.   Anemia of chronic kidney disease: -Hemoglobin currently stable.  Monitor intermittently   Right third toe necrosis: H/o prior left AKA: Physical deconditioning Per documentation, patient/family opted for conservative management and declined surgical management. Patient was seen by PT/OT with recommendation for SNF on discharge.   Prostate fluid collection: [facility], suggested that its a dilated seminal vesicle and recommended for no surgical intervention.    Mild cognitive dysfunction with behavioral changes.  Currently stable on seroquel.   Obesity class I -Outpatient follow-up  Pressure injury -Left buttock: Present on admission.  Continue local wound care as per nursing  Pancytopenia -No signs of bleeding.  Monitor intermittently  Hyponatremia -Mild.  Managed by hemodialysis by nephrology   DVT prophylaxis:  Eliquis Code Status: Full  Family Communication: None at bedside Disposition Plan: Status is: Inpatient Remains inpatient appropriate because: Of severity of illness.  Need for SNF placement.  Currently medically stable for discharge    Consultants: Nephrology/psychiatry/ID  Procedures: As above  Antimicrobials:  Anti-infectives (From admission, onward)    Start     Dose/Rate Route Frequency Ordered Stop   10/21/23 1230  vancomycin (VANCOCIN) 750 mg in sodium chloride 0.9 % 250 mL IVPB  Status:  Discontinued        750 mg 265 mL/hr over 60 Minutes Intravenous Every T-Th-Sa (Hemodialysis) 10/21/23 0804 10/21/23 0811   10/21/23 1200  vancomycin (VANCOCIN) 750 mg in sodium chloride 0.9 % 250 mL IVPB        750 mg 250 mL/hr over 60 Minutes Intravenous Every T-Th-Sa (Hemodialysis) 10/21/23 0811 10/27/23 1413   10/16/23 1345  vancomycin (VANCOCIN) 750 mg in sodium chloride 0.9 % 250 mL IVPB  Status:  Discontinued        750 mg 265 mL/hr over 60 Minutes Intravenous Every T-Th-Sa (Hemodialysis) 10/16/23 1257 10/21/23 0804   10/16/23 1200  vancomycin (VANCOREADY) IVPB 750 mg/150 mL  Status:  Discontinued        750 mg 150 mL/hr over 60 Minutes Intravenous Every T-Th-Sa (Hemodialysis) 10/14/23 1843 10/16/23 1257   10/14/23 1845  vancomycin (VANCOREADY) IVPB 500 mg/100 mL        500 mg 100 mL/hr over 60 Minutes Intravenous  Once 10/14/23 1843 10/15/23 0806   10/14/23 1200  vancomycin (VANCOREADY) IVPB 500 mg/100 mL  Status:  Discontinued        500 mg 100 mL/hr over 60 Minutes Intravenous Every T-Th-Sa (Hemodialysis) 10/13/23 1309 10/14/23 1843   10/13/23 1200  vancomycin (VANCOREADY) IVPB 500 mg/100 mL  Status:  Discontinued        500 mg 100 mL/hr over 60 Minutes Intravenous Every M-W-F (Hemodialysis) 10/11/23 1517 10/13/23 1309   10/12/23 1200  vancomycin (VANCOREADY) IVPB 500 mg/100 mL  Status:  Discontinued        500 mg 100 mL/hr over 60 Minutes Intravenous Every T-Th-Sa  (Hemodialysis) 10/11/23 1514 10/11/23 1517   09/24/23 1200  vancomycin (VANCOREADY) IVPB 500 mg/100 mL  Status:  Discontinued        500 mg 100 mL/hr over 60 Minutes Intravenous Every M-W-F (Hemodialysis) 09/21/23 1246 10/11/23 1514   09/20/23 1200  vancomycin (VANCOCIN) 750 mg in sodium chloride 0.9 % 250 mL IVPB  Status:  Discontinued        750 mg 265 mL/hr over 60 Minutes Intravenous Every M-W-F (Hemodialysis) 09/20/23 0446 09/21/23 1246   09/20/23 1045  vancomycin (VANCOCIN) 750 mg in sodium chloride 0.9 % 250 mL IVPB        750 mg 265 mL/hr over 60 Minutes Intravenous  Once 09/20/23 0959 09/20/23 1226   09/20/23 0945  vancomycin (VANCOREADY) IVPB 750 mg/150 mL  Status:  Discontinued        750 mg 150 mL/hr over 60 Minutes Intravenous  Once 09/20/23 0855 09/20/23 0959   09/10/23 1200  vancomycin (VANCOREADY) IVPB 750 mg/150 mL  Status:  Discontinued        750 mg 150 mL/hr over 60 Minutes Intravenous Every M-W-F (Hemodialysis) 09/09/23 2030 09/20/23 0446   09/09/23 2200  linezolid (ZYVOX) tablet 600 mg  Status:  Discontinued        600 mg Oral Every 12 hours 09/09/23 2035 09/10/23 0946        Subjective: Patient seen and examined at bedside.  Poor historian.  No seizures, fever, agitation reported.   Objective: Vitals:   11/21/23 2045 11/22/23 0743 11/22/23 2141 11/23/23 0509  BP: (!) 139/91 (!) 130/94 123/84 117/83  Pulse: (!) 106 (!) 128 (!) 123 (!) 124  Resp: 18 18 18 18   Temp:  98 F (36.7 C)  98.1 F (36.7 C)  TempSrc: Axillary     SpO2: 98%  90% 100%  Weight:      Height:       No intake or output data in the 24 hours ending 11/23/23 0716  Filed Weights   11/16/23 1826 11/18/23 0753 11/20/23 0840  Weight: 94.2 kg 94.9 kg 91.6 kg    Examination:  General: Currently on room air.  No distress.  Poor historian.  Chronically ill and deconditioned looking.  Flat affect.  Slow to respond. respiratory: Decreased breath sounds at bases bilaterally with some crackles  CVS: S1-S2 heard; remains intermittently tachycardic  abdominal: Soft, obese, nontender, distended slightly; no organomegaly; bowel sounds normally heard  extremities: Trace right lower extremity edema present; has left AKA  Data Reviewed: I have personally reviewed following labs and imaging studies  CBC: Recent Labs  Lab 11/16/23 1417 11/17/23 0749  WBC 3.9* 3.5*  HGB 9.4* 10.4*  HCT 32.5* 35.1*  MCV 94.5 92.9  PLT 127* 115*   Basic Metabolic Panel: Recent Labs  Lab 11/16/23 1417 11/17/23 0749  NA 133* 134*  K 5.6* 5.3*  CL 93* 96*  CO2 26 25  GLUCOSE 360* 94  BUN 58* 34*  CREATININE 8.19* 5.38*  CALCIUM 9.3 9.4  PHOS 7.6*  --    GFR: Estimated Creatinine Clearance: 18.9 mL/min (A) (by C-G formula based on SCr of 5.38 mg/dL (H)). Liver Function Tests: No results for input(s): "AST", "ALT", "ALKPHOS", "BILITOT", "PROT", "ALBUMIN" in the last 168 hours.  No results for input(s): "LIPASE", "AMYLASE" in the last 168 hours. No results for input(s): "AMMONIA" in the last 168 hours. Coagulation Profile: No results for input(s): "INR", "PROTIME" in the last 168 hours. Cardiac Enzymes: No results for input(s): "CKTOTAL", "CKMB", "CKMBINDEX", "TROPONINI" in the last 168 hours. BNP (last 3 results) No results for input(s): "PROBNP" in the last 8760 hours. HbA1C: No results for input(s): "HGBA1C" in the last 72 hours. CBG: Recent Labs  Lab 11/21/23 2139 11/22/23 0740 11/22/23 1148 11/22/23 1648 11/22/23 2139  GLUCAP 162* 135* 110* 157* 215*   Lipid Profile: No results for input(s): "CHOL", "HDL", "LDLCALC", "TRIG", "CHOLHDL", "LDLDIRECT" in the last 72 hours. Thyroid Function Tests: No results for input(s): "TSH", "T4TOTAL", "FREET4", "T3FREE", "THYROIDAB" in the last 72 hours. Anemia Panel: No results for input(s): "VITAMINB12", "FOLATE", "FERRITIN", "TIBC", "IRON", "RETICCTPCT" in the last 72 hours. Sepsis Labs: No results for input(s): "PROCALCITON",  "LATICACIDVEN" in the last 168 hours.  No results found for this or any previous visit (from the past 240 hours).       Radiology Studies: No results found.      Scheduled Meds:  (feeding supplement) PROSource Plus  30 mL Oral BID BM   apixaban  5 mg Oral BID   Chlorhexidine Gluconate Cloth  6 each Topical Q0600   darbepoetin (ARANESP) injection - DIALYSIS  150 mcg Subcutaneous Q Sun-1800   docusate sodium  100 mg Oral BID   gabapentin  100 mg Oral TID   Gerhardt's butt cream   Topical BID   hydrocortisone  25 mg Rectal BID   insulin aspart  0-5 Units Subcutaneous QHS   insulin  aspart  0-9 Units Subcutaneous TID WC   insulin glargine  6 Units Subcutaneous Daily   lactulose  20 g Oral Once   loratadine  10 mg Oral Daily   midodrine  10 mg Oral Q T,Th,Sa-HD   pantoprazole  40 mg Oral Daily   polyethylene glycol  17 g Oral Daily   povidone-Iodine   Topical Q0600   QUEtiapine  12.5 mg Oral q morning   QUEtiapine  25 mg Oral QHS   senna-docusate  2 tablet Oral BID   sevelamer carbonate  2,400 mg Oral TID WC   sodium zirconium cyclosilicate  10 g Oral TID   Continuous Infusions:  iron sucrose 100 mg (11/20/23 1505)          Glade Lloyd, MD [facility] 11/23/2023, 7:16 AM

## 2023-11-23 NOTE — Progress Notes (Addendum)
 Received patient in bed to unit.  Alert and oriented.  Informed consent signed and in chart.   TX duration:3.5 hours  Patient tolerated well.  Transported back to the room  Alert, without acute distress.  Hand-off given to patient's nurse.   Access used: Left Femoral HD Cath Access issues: none  Total UF removed: 1.9L Medication(s) given: 100cc bolus, albumin, oxycodone, iron sucrose   11/23/23 1840  Vitals  BP 122/88  Pulse Rate (!) 107  Resp (!) 9  Oxygen Therapy  SpO2 100 %  O2 Device Nasal Cannula  O2 Flow Rate (L/min) 2 L/min  Patient Activity (if Appropriate) In bed  Pulse Oximetry Type Continuous  During Treatment Monitoring  HD Safety Checks Performed Yes  Intra-Hemodialysis Comments Tx completed  Dialysis Fluid Bolus Normal Saline  Bolus Amount (mL) 300 mL  Post Treatment  Dialyzer Clearance Clear  Liters Processed 84  Fluid Removed (mL) 1900 mL  Tolerated HD Treatment Yes  Hemodialysis Catheter Left Femoral vein Double lumen Permanent (Tunneled)  Placement Date/Time: 07/16/23 1318   Serial / Lot #: [id number]  Expiration Date: 05/24/26  Time Out: Correct patient;Correct site;Correct procedure  Maximum sterile barrier precautions: Hand hygiene;Cap;Mask;Sterile gown;Sterile gloves;Large sterile ...  Site Condition No complications  Blue Lumen Status Flushed;Heparin locked;Dead end cap in place  Red Lumen Status Flushed;Heparin locked;Dead end cap in place  Purple Lumen Status N/A  Catheter fill solution Heparin 1000 units/ml  Catheter fill volume (Arterial) 2.3 cc  Catheter fill volume (Venous) 2.3  Dressing Type Transparent  Dressing Status Antimicrobial disc/dressing in place;Clean, Dry, Intact  Interventions Other (Comment) (deaccessed)  Drainage Description None  Dressing Change Due 11/30/23  Post treatment catheter status Capped and Clamped     Stacie Glaze LPN [facility]

## 2023-11-23 NOTE — Progress Notes (Signed)
   11/23/23 1841  Vitals  Temp (!) 97.4 F (36.3 C)  BP 138/89  Pulse Rate (!) 107  Resp 10  Oxygen Therapy  SpO2 100 %  O2 Device Room Air  Patient Activity (if Appropriate) In bed  Pulse Oximetry Type Continuous  Post Treatment  Dialyzer Clearance Clear  Liters Processed 84  Fluid Removed (mL) 1.9 mL  Tolerated HD Treatment Yes

## 2023-11-24 DIAGNOSIS — R509 Fever, unspecified: Secondary | ICD-10-CM | POA: Diagnosis not present

## 2023-11-24 DIAGNOSIS — R160 Hepatomegaly, not elsewhere classified: Secondary | ICD-10-CM | POA: Diagnosis not present

## 2023-11-24 DIAGNOSIS — F39 Unspecified mood [affective] disorder: Secondary | ICD-10-CM | POA: Diagnosis not present

## 2023-11-24 DIAGNOSIS — M7989 Other specified soft tissue disorders: Secondary | ICD-10-CM | POA: Diagnosis not present

## 2023-11-24 DIAGNOSIS — R531 Weakness: Secondary | ICD-10-CM | POA: Diagnosis not present

## 2023-11-24 DIAGNOSIS — R55 Syncope and collapse: Secondary | ICD-10-CM | POA: Diagnosis not present

## 2023-11-24 DIAGNOSIS — Z743 Need for continuous supervision: Secondary | ICD-10-CM | POA: Diagnosis not present

## 2023-11-24 DIAGNOSIS — I82622 Acute embolism and thrombosis of deep veins of left upper extremity: Secondary | ICD-10-CM | POA: Diagnosis not present

## 2023-11-24 DIAGNOSIS — B9562 Methicillin resistant Staphylococcus aureus infection as the cause of diseases classified elsewhere: Secondary | ICD-10-CM | POA: Diagnosis not present

## 2023-11-24 DIAGNOSIS — R601 Generalized edema: Secondary | ICD-10-CM | POA: Diagnosis not present

## 2023-11-24 DIAGNOSIS — R600 Localized edema: Secondary | ICD-10-CM | POA: Diagnosis not present

## 2023-11-24 DIAGNOSIS — R279 Unspecified lack of coordination: Secondary | ICD-10-CM | POA: Diagnosis not present

## 2023-11-24 DIAGNOSIS — R609 Edema, unspecified: Secondary | ICD-10-CM | POA: Diagnosis not present

## 2023-11-24 DIAGNOSIS — R0989 Other specified symptoms and signs involving the circulatory and respiratory systems: Secondary | ICD-10-CM | POA: Diagnosis not present

## 2023-11-24 DIAGNOSIS — R4189 Other symptoms and signs involving cognitive functions and awareness: Secondary | ICD-10-CM | POA: Diagnosis not present

## 2023-11-24 DIAGNOSIS — R69 Illness, unspecified: Secondary | ICD-10-CM | POA: Diagnosis not present

## 2023-11-24 DIAGNOSIS — I330 Acute and subacute infective endocarditis: Secondary | ICD-10-CM | POA: Diagnosis not present

## 2023-11-24 DIAGNOSIS — E1143 Type 2 diabetes mellitus with diabetic autonomic (poly)neuropathy: Secondary | ICD-10-CM | POA: Diagnosis not present

## 2023-11-24 DIAGNOSIS — Z992 Dependence on renal dialysis: Secondary | ICD-10-CM | POA: Diagnosis not present

## 2023-11-24 DIAGNOSIS — Z8679 Personal history of other diseases of the circulatory system: Secondary | ICD-10-CM | POA: Diagnosis not present

## 2023-11-24 DIAGNOSIS — J9811 Atelectasis: Secondary | ICD-10-CM | POA: Diagnosis not present

## 2023-11-24 DIAGNOSIS — Z7401 Bed confinement status: Secondary | ICD-10-CM | POA: Diagnosis not present

## 2023-11-24 DIAGNOSIS — G8929 Other chronic pain: Secondary | ICD-10-CM | POA: Diagnosis not present

## 2023-11-24 DIAGNOSIS — R0902 Hypoxemia: Secondary | ICD-10-CM | POA: Diagnosis not present

## 2023-11-24 DIAGNOSIS — R7881 Bacteremia: Secondary | ICD-10-CM | POA: Diagnosis not present

## 2023-11-24 DIAGNOSIS — M6281 Muscle weakness (generalized): Secondary | ICD-10-CM | POA: Diagnosis not present

## 2023-11-24 DIAGNOSIS — I120 Hypertensive chronic kidney disease with stage 5 chronic kidney disease or end stage renal disease: Secondary | ICD-10-CM | POA: Diagnosis not present

## 2023-11-24 DIAGNOSIS — N186 End stage renal disease: Secondary | ICD-10-CM | POA: Diagnosis not present

## 2023-11-24 DIAGNOSIS — D509 Iron deficiency anemia, unspecified: Secondary | ICD-10-CM | POA: Diagnosis not present

## 2023-11-24 DIAGNOSIS — I69391 Dysphagia following cerebral infarction: Secondary | ICD-10-CM | POA: Diagnosis not present

## 2023-11-24 DIAGNOSIS — R41841 Cognitive communication deficit: Secondary | ICD-10-CM | POA: Diagnosis not present

## 2023-11-24 DIAGNOSIS — E109 Type 1 diabetes mellitus without complications: Secondary | ICD-10-CM | POA: Diagnosis not present

## 2023-11-24 DIAGNOSIS — R1311 Dysphagia, oral phase: Secondary | ICD-10-CM | POA: Diagnosis not present

## 2023-11-24 DIAGNOSIS — Z89621 Acquired absence of right hip joint: Secondary | ICD-10-CM | POA: Diagnosis not present

## 2023-11-24 DIAGNOSIS — D631 Anemia in chronic kidney disease: Secondary | ICD-10-CM | POA: Diagnosis not present

## 2023-11-24 DIAGNOSIS — I499 Cardiac arrhythmia, unspecified: Secondary | ICD-10-CM | POA: Diagnosis not present

## 2023-11-24 DIAGNOSIS — N250 Renal osteodystrophy: Secondary | ICD-10-CM | POA: Diagnosis not present

## 2023-11-24 DIAGNOSIS — I82722 Chronic embolism and thrombosis of deep veins of left upper extremity: Secondary | ICD-10-CM | POA: Diagnosis not present

## 2023-11-24 DIAGNOSIS — Z79899 Other long term (current) drug therapy: Secondary | ICD-10-CM | POA: Diagnosis not present

## 2023-11-24 DIAGNOSIS — E43 Unspecified severe protein-calorie malnutrition: Secondary | ICD-10-CM | POA: Diagnosis not present

## 2023-11-24 DIAGNOSIS — R404 Transient alteration of awareness: Secondary | ICD-10-CM | POA: Diagnosis not present

## 2023-11-24 DIAGNOSIS — K3184 Gastroparesis: Secondary | ICD-10-CM | POA: Diagnosis not present

## 2023-11-24 DIAGNOSIS — I951 Orthostatic hypotension: Secondary | ICD-10-CM | POA: Diagnosis not present

## 2023-11-24 DIAGNOSIS — Z794 Long term (current) use of insulin: Secondary | ICD-10-CM | POA: Diagnosis not present

## 2023-11-24 DIAGNOSIS — I469 Cardiac arrest, cause unspecified: Secondary | ICD-10-CM | POA: Diagnosis not present

## 2023-11-24 DIAGNOSIS — E785 Hyperlipidemia, unspecified: Secondary | ICD-10-CM | POA: Diagnosis not present

## 2023-11-24 DIAGNOSIS — I639 Cerebral infarction, unspecified: Secondary | ICD-10-CM | POA: Diagnosis not present

## 2023-11-24 DIAGNOSIS — N2581 Secondary hyperparathyroidism of renal origin: Secondary | ICD-10-CM | POA: Diagnosis not present

## 2023-11-24 DIAGNOSIS — I63311 Cerebral infarction due to thrombosis of right middle cerebral artery: Secondary | ICD-10-CM | POA: Diagnosis not present

## 2023-11-24 DIAGNOSIS — F4322 Adjustment disorder with anxiety: Secondary | ICD-10-CM | POA: Diagnosis not present

## 2023-11-24 DIAGNOSIS — I517 Cardiomegaly: Secondary | ICD-10-CM | POA: Diagnosis not present

## 2023-11-24 DIAGNOSIS — Z89512 Acquired absence of left leg below knee: Secondary | ICD-10-CM | POA: Diagnosis not present

## 2023-11-24 DIAGNOSIS — I38 Endocarditis, valve unspecified: Secondary | ICD-10-CM | POA: Diagnosis not present

## 2023-11-24 DIAGNOSIS — Z7901 Long term (current) use of anticoagulants: Secondary | ICD-10-CM | POA: Diagnosis not present

## 2023-11-24 DIAGNOSIS — I10 Essential (primary) hypertension: Secondary | ICD-10-CM | POA: Diagnosis not present

## 2023-11-24 LAB — GLUCOSE, CAPILLARY
Glucose-Capillary: 190 mg/dL — ABNORMAL HIGH (ref 70–99)
Glucose-Capillary: 92 mg/dL (ref 70–99)

## 2023-11-24 MED ORDER — INSULIN GLARGINE 100 UNIT/ML ~~LOC~~ SOLN: 6.0000 [IU] | Freq: Every day | SUBCUTANEOUS | Status: DC

## 2023-11-24 MED ORDER — QUETIAPINE FUMARATE 25 MG PO TABS: ORAL_TABLET | ORAL | Status: DC

## 2023-11-24 MED ORDER — CLONAZEPAM 0.5 MG PO TABS: 0.2500 mg | ORAL_TABLET | Freq: Two times a day (BID) | ORAL | 0 refills | Status: DC | PRN

## 2023-11-24 MED ORDER — DOCUSATE SODIUM 100 MG PO CAPS: 100.0000 mg | ORAL_CAPSULE | Freq: Two times a day (BID) | ORAL | Status: DC

## 2023-11-24 MED ORDER — POLYETHYLENE GLYCOL 3350 17 G PO PACK: 17.0000 g | PACK | Freq: Every day | ORAL | Status: DC

## 2023-11-24 MED ORDER — HYDROCORTISONE ACETATE 25 MG RE SUPP: 25.0000 mg | Freq: Two times a day (BID) | RECTAL | Status: DC

## 2023-11-24 MED ORDER — MIDODRINE HCL 10 MG PO TABS: 10.0000 mg | ORAL_TABLET | ORAL | Status: DC

## 2023-11-24 MED ORDER — SODIUM ZIRCONIUM CYCLOSILICATE 10 G PO PACK: 10.0000 g | PACK | Freq: Three times a day (TID) | ORAL | Status: DC

## 2023-11-24 MED ORDER — PROSOURCE PLUS PO LIQD: 30.0000 mL | Freq: Two times a day (BID) | ORAL | Status: DC

## 2023-11-24 MED ORDER — OXYCODONE HCL 5 MG PO TABS: 5.0000 mg | ORAL_TABLET | ORAL | 0 refills | Status: DC | PRN

## 2023-11-24 NOTE — TOC Progression Note (Signed)
 Transition of Care [facility]) - Progression Note    Patient Details  Name: [name] MRN: [id number] Date of Birth: 06-26-1981  [facility]) CM/SW Contact  Baldemar Lenis, [name] Phone Number: 11/24/2023, 9:57 AM  Clinical Narrative:   CSW informed MD that patient can be discharged to SNF today, awaiting orders/summary. CSW following.    Expected Discharge Plan: Skilled Nursing Facility Barriers to Discharge: SNF Pending discharge orders, SNF Pending discharge summary  Expected Discharge Plan and Services                                               Social Determinants of Health (SDOH) Interventions SDOH Screenings   Food Insecurity: No Food Insecurity (09/09/2023)  Housing: Unknown (09/09/2023)  Transportation Needs: No Transportation Needs (09/09/2023)  Utilities: Not At Risk (09/09/2023)  Alcohol Screen: Low Risk  (02/15/2023)  Depression (PHQ2-9): Low Risk  (02/15/2023)  Financial Resource Strain: Low Risk  (02/15/2023)  Physical Activity: Inactive (02/15/2023)  Social Connections: Socially Integrated (02/15/2023)  Stress: No Stress Concern Present (02/15/2023)  Tobacco Use: Low Risk  (09/12/2023)  Recent Concern: Tobacco Use - Medium Risk (09/01/2023)   Received from [facility]    Readmission Risk Interventions     No data to display

## 2023-11-24 NOTE — TOC Transition Note (Cosign Needed)
 Transition of Care [facility]) - Discharge Note   Patient Details  Name: [name] MRN: [id number] Date of Birth: 12/04/1980  Transition of Care [facility]) CM/SW Contact:  Kalyn Hofstra Felipa Emory, Student-Social Work Phone Number: 11/24/2023, 3:27 PM   Clinical Narrative:   MSW Student received insurance approval for patient to admit to [facility]. MSW Student confirmed with MD that patient stable for discharge. MSW Student notified mother [name] and they are in agreement with discharge. MSW Student confirmed bed is available at SNF. Transport arranged with [facility] for next available.   Number to call report: [phone], room A2 bed 2      Final next level of care: Skilled Nursing Facility Barriers to Discharge: Barriers Resolved   Patient Goals and CMS Choice Patient states their goals for this hospitalization and ongoing recovery are:: To get better CMS Medicare.gov Compare Post Acute Care list provided to:: Patient Choice offered to / list presented to : Patient [place] ownership interest in [facility].provided to:: Patient    Discharge Placement              Patient chooses bed at:  [facility]) Patient to be transferred to facility by: [facility] Name of family member notified: [name] Patient and family notified of of transfer: 11/24/23  Discharge Plan and Services Additional resources added to the After Visit Summary for                                       Social Drivers of Health (SDOH) Interventions SDOH Screenings   Food Insecurity: No Food Insecurity (09/09/2023)  Housing: Unknown (09/09/2023)  Transportation Needs: No Transportation Needs (09/09/2023)  Utilities: Not At Risk (09/09/2023)  Alcohol Screen: Low Risk  (02/15/2023)  Depression (PHQ2-9): Low Risk  (02/15/2023)  Financial Resource Strain: Low Risk  (02/15/2023)  Physical Activity: Inactive (02/15/2023)  Social Connections: Socially Integrated (02/15/2023)  Stress: No Stress Concern  Present (02/15/2023)  Tobacco Use: Low Risk  (09/12/2023)  Recent Concern: Tobacco Use - Medium Risk (09/01/2023)   Received from [facility]     Readmission Risk Interventions     No data to display

## 2023-11-24 NOTE — Discharge Summary (Signed)
 Physician Discharge Summary   Patient: [name] MRN: [id number] DOB: 17-Feb-1981  Admit date:     09/09/2023  Discharge date: 11/24/23  Discharge Physician: Jacquelin Hawking, MD   PCP: [name], NP   Recommendations at discharge:  PCP follow-up Nephrology follow-up Neurology follow-up Urology follow-up  Discharge Diagnoses: Principal Problem:   Endocarditis Active Problems:   MRSA bacteremia   Endocarditis of tricuspid valve   ESRD on dialysis [facility])   Cognitive impairment   Insulin dependent type 1 diabetes mellitus ([name])   Diabetic gastroparesis ([name])   Diabetic foot infection ([name])   Endocarditis of mitral valve   Cerebrovascular accident (CVA) due to embolism of cerebral artery ([name])  Resolved Problems:   * No resolved hospital problems. *  Hospital Course: RAYWOOD WAILES is a 43 y.o. 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  While at [facility] he underwent cardiac cath showing non obstructive disease, craniotomy with abscess drainage on 09/01/23,. He had subarachnoid hemorrhage while on IV heparin, right third toe distal necrosis,was seen by podiatry, but family wanted conservative management. Since his tertiary care needs have been completed, he was transferred back to [facility] for further management.  PTOT  recommending SNF and patient remained admitted until SNF bed was found.  Assessment and Plan:  MRSA TV/MV endocarditis Positive MRSA blood cultures. Diagnosed on Transesophageal Echocardiogram. Patient managed on Vancomycin. At outside hospital, patient underwent  a left heart catheterization which was significant for nonobstructive disease. Infectious disease recommendations to continue Vancomycin IV and patient has completed his 8 week treatment course.   Prostate fluid collection/abscess Urology consulted at outside hospital and per their assessment, fluid collection was secondary to dilated seminal vesicle with no surgical intervention recommended. Infectious disease with continued concern for possible abscess. Patient covered with vancomycin with recommendation for outpatient urology follow-up.   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 Patient evaluated by neurosurgery. Concern for abscess ruled out by neurosurgery initially. No surgical intervention per neurosurgery. However, at outside hospital, patient was evaluated by neurosurgery who performed a craniotomy for abscess drainage.   ESRD on HD Nephrology consulted and managing hemodialysis. Patient to continue Tuesday, Thursday, Saturday schedule.  Hyperkalemia Secondary to patient not agreeing to a renal diet. Patient is prescribed Lokelma scheduled, for which he does not take as prescribed. Continue Lokelma on discharge. Monitor BMP. Last potassium of 5.8 was obtained prior to hemodialysis. Discussed with nephrology, who is okay with discharge.   Hyperkalemia Improved with HD.   Anemia of chronic kidney disease Hemoglobin stable.   CKD mineral bone disease Continue calcitriol   Acute CVA MRI confirms large right MCA branch infarct. MRA head (11/25) significant for large necrotic appearing lesion within the right frontal lobe within area of the prior right MCA territory infarct with vasogenic edema and 8 mm leftward midline shift. LDL of 34. Hemoglobin A1C of 8.9%. Transthoracic Echocardiogram (10/13) significant for an LVEF of 25-30% with concern for AV/MV abnormality with Transesophageal Echocardiogram (10/16 & 10/22) significant for MV/TV vegetations. Neurology  recommendations for Eliquis. PT/OT recommendations for SNF. Outpatient neurology follow-up.   Primary hypertension  Patient is on metoprolol as an outpatient. Metoprolol held.   Acute left upper extremity DVT Diagnosed on 08/02/2023 and located in the left internal jugular vein, left subclavian vein and left axillary vein. Repeat upper extremity duplex on 1/19 shows persistent clot involving the left internal jugular vein. Continue Eliquis on discharge.   Right third toe necrosis Noted. Per documentation, patient/family opting for conservative management and decline surgical management.   Diabetes mellitus type 1 Poorly controlled with hyperglycemia and hypoglycemia. Hemoglobin A1C of 8.9%. Seems mostly due to continued poor dietary choices. Partly iatrogenic secondary to attempting to adjust regimen for dietary indiscretions. Patient to bed discharged on insulin glargine 6 units daily.   Gastroparesis Noted. Patient is not on medication management apart from insulin for blood sugar control. Patient without symptoms and is tolerating his diet.   History of left BKA Noted. Patient seen by PT/OT with recommendation for SNF on discharge.   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 Patient started on Seroquel for behavioral change management.   Obesity Estimated body mass index is 32.35 kg/m as calculated from the following:   Height as of this encounter: 5\' 7"  (1.702 m).   Weight as of this encounter: 93.7 kg.   Pressure injury Left buttock. Not present on admission.  Consultants:  Nephrology   Procedures:  Hemodialysis  Disposition: Skilled nursing facility Diet recommendation: Carb modified/renal diet (patient unlikely to comply with this recommendation)   DISCHARGE MEDICATION: Allergies as of 11/24/2023   No Known Allergies      Medication List     STOP taking these medications    insulin lispro 100 UNIT/ML injection Commonly known as: HUMALOG   linezolid  600 MG tablet Commonly known as: ZYVOX   metoprolol tartrate 25 MG tablet Commonly known as: LOPRESSOR   vancomycin 500 MG/100ML IVPB Commonly known as: VANCOREADY       TAKE these medications    (feeding supplement) PROSource Plus liquid Take 30 mLs by mouth 2 (two) times daily between meals. Start taking on: November 25, 2023   acetaminophen 325 MG tablet Commonly known as: TYLENOL Take 2 tablets (650 mg total) by mouth every 6 (six) hours as needed for mild pain (pain score 1-3), fever, moderate pain (pain score 4-6) or headache.   albuterol (2.5 MG/3ML) 0.083% nebulizer solution Commonly known as: PROVENTIL Take 3 mLs (2.5 mg total) by nebulization every 6 (six) hours as needed for wheezing or shortness of breath.   apixaban 5 MG Tabs tablet Commonly known as: ELIQUIS Take 1 tablet (5 mg total) by mouth 2 (two) times daily.   calcitRIOL 0.5 MCG capsule Commonly known as: ROCALTROL Take 3 capsules (1.5 mcg total) by mouth every Monday, Wednesday, and Friday with hemodialysis.   clonazePAM 0.5 MG tablet Commonly known as: KLONOPIN Take 0.5 tablets (0.25 mg total) by mouth 2 (two) times daily as needed for anxiety.   docusate sodium 100 MG capsule Commonly known as: COLACE Take 1 capsule (100 mg total) by mouth 2 (two) times daily.   gabapentin 100 MG capsule Commonly known as: NEURONTIN Take 100 mg by mouth 3 (three) times daily.   hydrocortisone 25 MG suppository Commonly known as: ANUSOL-HC Place 1 suppository (25 mg total) rectally 2 (two) times daily.   insulin glargine 100 UNIT/ML injection Commonly known as: LANTUS Inject 0.06 mLs (6 Units total) into the skin daily. Start taking on: November 25, 2023 What changed:  how much to take when to take this   midodrine 10  MG tablet Commonly known as: PROAMATINE Take 1 tablet (10 mg total) by mouth Every Tuesday,Thursday,and Saturday with dialysis. Start taking on: November 25, 2023   omeprazole 20 MG  capsule Commonly known as: PRILOSEC Take 20 mg by mouth daily.   ondansetron 4 MG disintegrating tablet Commonly known as: ZOFRAN-ODT Take 4 mg by mouth every 6 (six) hours as needed for nausea or vomiting.   oxyCODONE 5 MG immediate release tablet Commonly known as: Oxy IR/ROXICODONE Take 1 tablet (5 mg total) by mouth every 4 (four) hours as needed for severe pain (pain score 7-10).   polyethylene glycol 17 g packet Commonly known as: MIRALAX / GLYCOLAX Take 17 g by mouth daily. Start taking on: November 25, 2023   QUEtiapine 25 MG tablet Commonly known as: SEROQUEL Take 0.5 tablets (12.5 mg total) by mouth in the morning AND 1 tablet (25 mg total) at bedtime. What changed: See the new instructions.   sevelamer 800 MG tablet Commonly known as: RENAGEL Take 800 mg by mouth 3 (three) times daily with meals.   sodium zirconium cyclosilicate 10 g Pack packet Commonly known as: LOKELMA Take 10 g by mouth 3 (three) times daily.               Durable Medical Equipment  (From admission, onward)           Start     Ordered   09/14/23 1043  For home use only DME Other see comment  Once       Comments: Roho cushion  Question:  Length of Need  Answer:  Lifetime   09/14/23 1042   09/14/23 1042  For home use only DME Other see comment  Once       Comments: Sliding board 28 inches  Question:  Length of Need  Answer:  Lifetime   09/14/23 1042   09/13/23 1624  For home use only DME 3 n 1  Once        09/13/23 1623   09/13/23 1623  For home use only DME Hospital bed  Once       Comments: Contact person for delivery is Zeek Rostron [phone]  Question Answer Comment  Length of Need Lifetime   Patient has (list medical condition): Brain abscess s/p craniotomy and abscess drainage on 12/4, weakness   The above medical condition requires: Patient requires the ability to reposition frequently   Head must be elevated greater than: 45 degrees   Bed type Semi-electric    Hoyer Lift Yes   Support Surface: Gel Overlay      09/13/23 1623            Contact information for follow-up providers     [facility]. Go on 11/25/2023.   Why: Schedule is Tuesday, Thursday, Saturday.  Arrive at 6:50 am for 7:05 am chair time.  Please send hoyer pad with pt to HD treatments. Contact information: [address] [name] [address] [phone]         Micki Riley, MD Follow up in 4 week(s).   Specialties: Neurology, Radiology Why: For hospital follow-up Contact information: [address] [name] [address] [phone]         [facility] Follow up.   Why: Prostatic fluid Contact information: [address] [place] [address] [phone]             Contact information for after-discharge care  Destination     [facility] .   Service: Skilled Nursing Contact information: [address] [place] [address] [phone]                    Discharge Exam: BP (!) 113/43   Pulse (!) 124   Temp 98.3 F (36.8 C) (Axillary)   Resp 18   Ht 5\' 7"  (1.702 m)   Wt 93.7 kg   SpO2 100%   BMI 32.35 kg/m   General exam: Appears calm and comfortable Respiratory system: Respiratory effort normal. Gastrointestinal system: Abdomen is non-distended Central nervous system: Alert and oriented. No focal neurological deficits.   Condition at discharge: stable  The results of significant diagnostics from this hospitalization (including imaging, microbiology, ancillary and laboratory) are listed below for reference.   Imaging Studies: No results found.  Microbiology: Results for orders placed or performed during the hospital encounter of 09/09/23  MRSA Next Gen by PCR, Nasal     Status: None   Collection Time: 09/10/23  8:50 AM   Specimen: Nasal Mucosa; Nasal Swab  Result Value Ref Range Status    MRSA by PCR Next Gen NOT DETECTED NOT DETECTED Final    Comment: (NOTE) The GeneXpert MRSA Assay (FDA approved for NASAL specimens only), is one component of a comprehensive MRSA colonization surveillance program. It is not intended to diagnose MRSA infection nor to guide or monitor treatment for MRSA infections. Test performance is not FDA approved in patients less than 79 years old. Performed at [facility], [address], [place], [name] [address]     Labs: CBC: Recent Labs  Lab 11/23/23 1500  WBC 4.0  NEUTROABS 2.5  HGB 9.9*  HCT 33.8*  MCV 95.2  PLT 131*   Basic Metabolic Panel: Recent Labs  Lab 11/23/23 1500  NA 137  K 5.8*  CL 98  CO2 27  GLUCOSE 72  BUN 63*  CREATININE 8.09*  CALCIUM 8.9  PHOS 8.8*   Liver Function Tests: Recent Labs  Lab 11/23/23 1500  ALBUMIN 3.4*   CBG: Recent Labs  Lab 11/23/23 0745 11/23/23 1139 11/23/23 2027 11/24/23 0915 11/24/23 1312  GLUCAP 164* 151* 79 190* 92    Discharge time spent: 35 minutes.  Signed: Jacquelin Hawking, MD [facility] 11/24/2023

## 2023-11-24 NOTE — Progress Notes (Signed)
 [facility] Progress Note   Subjective: Informed patient that he will be leaving hospital today to go to [facility] in [place] and still start HD 11/25/2023 at [facility]. He is very excited.    Objective Vitals:   11/23/23 1841 11/23/23 1849 11/23/23 1900 11/24/23 0914  BP: 138/89  (!) 135/93 (!) 102/44  Pulse: (!) 107   (!) 135  Resp: 10   18  Temp: (!) 97.4 F (36.3 C)  98.2 F (36.8 C)   TempSrc:   Oral   SpO2: 100%     Weight:  93.7 kg    Height:       General: Alert, chronically ill male NAD Heart: RRR no MRG Lungs: CTA bilaterally RA, unlabored Abdomen: NABS, soft NT ND, trace abdominal edema Extremities: Left AKA no stump edema, RLE no edema. LUE edematous Dialysis Access: L FEM TDC dressing dry clear  Additional Objective Labs: Basic Metabolic Panel: Recent Labs  Lab 11/23/23 1500  NA 137  K 5.8*  CL 98  CO2 27  GLUCOSE 72  BUN 63*  CREATININE 8.09*  CALCIUM 8.9  PHOS 8.8*   Liver Function Tests: Recent Labs  Lab 11/23/23 1500  ALBUMIN 3.4*   No results for input(s): "LIPASE", "AMYLASE" in the last 168 hours. CBC: Recent Labs  Lab 11/23/23 1500  WBC 4.0  NEUTROABS 2.5  HGB 9.9*  HCT 33.8*  MCV 95.2  PLT 131*   Blood Culture    Component Value Date/Time   SDES BLOOD SITE NOT SPECIFIED 08/25/2023 1706   SPECREQUEST  08/25/2023 1706    BOTTLES DRAWN AEROBIC ONLY Blood Culture results may not be optimal due to an inadequate volume of blood received in culture bottles   CULT  08/25/2023 1706    NO GROWTH 5 DAYS Performed at [facility], [address] [place], [name] [address]    REPTSTATUS 08/30/2023 FINAL 08/25/2023 1706    Cardiac Enzymes: No results for input(s): "CKTOTAL", "CKMB", "CKMBINDEX", "TROPONINI" in the last 168 hours. CBG: Recent Labs  Lab 11/22/23 2139 11/23/23 0745 11/23/23 1139 11/23/23 2027 11/24/23 0915  GLUCAP 215* 164* 151* 79 190*   Iron Studies: No results for input(s):  "IRON", "TIBC", "TRANSFERRIN", "FERRITIN" in the last 72 hours. @lablastinr3 @ Studies/Results: No results found. Medications:  iron sucrose Stopped (11/23/23 2127)    (feeding supplement) PROSource Plus  30 mL Oral BID BM   apixaban  5 mg Oral BID   Chlorhexidine Gluconate Cloth  6 each Topical Q0600   darbepoetin (ARANESP) injection - DIALYSIS  150 mcg Subcutaneous Q Sun-1800   docusate sodium  100 mg Oral BID   gabapentin  100 mg Oral TID   Gerhardt's butt cream   Topical BID   hydrocortisone  25 mg Rectal BID   insulin aspart  0-5 Units Subcutaneous QHS   insulin aspart  0-9 Units Subcutaneous TID WC   insulin glargine  6 Units Subcutaneous Daily   lactulose  20 g Oral Once   loratadine  10 mg Oral Daily   midodrine  10 mg Oral Q T,Th,Sa-HD   pantoprazole  40 mg Oral Daily   polyethylene glycol  17 g Oral Daily   povidone-Iodine   Topical Q0600   QUEtiapine  12.5 mg Oral q morning   QUEtiapine  25 mg Oral QHS   senna-docusate  2 tablet Oral BID   sevelamer carbonate  2,400 mg Oral TID WC   sodium zirconium cyclosilicate  10 g Oral  TID     Dialysis Orders:   MWF - [facility] -> may be will be going to [place] [facility] on discharge 4hr, 400/A1.5, EDW 77kg, L femoral TDC, 2K/3Ca bath, no heparin   Assessment/Plan: MRSA bacteremia with MV/TV endocarditis + brain abscess:  Not a candidate for valve surgery, S/p line holiday and brain abscess drainage (at [facility]). Completed on extensive IV Vanc course on 1/29. S/p transfer to [facility] and drainage 08/31/24. Repeat brain MRI 10/17/23 showed improvement. Acute B CVA/septic emboli- due to above Acute LUE DVT: On Eliquis ESRD: Transitioned to TTS. Next HD [facility] HTN/volume: CT 10/09/23 with diffuse anasarca. Significantly above EDW but using bed weights. Max UF with HD. On Midodrine with HD and okay to use IV Albumin and extra Midodrine dose PRN. Enforcing fluid restrictions/compliance with full HD time. Appears  he may be gaining body weight more so than fluid. He reports eating chick-fil-a everyday. 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 On Aranesp to . Iron studies from 2/6 include: Iron 31, Tsat 15%, and Ferritin 140. On Fe load X 10 doses with HD-1st dose 2/8. Secondary HPTH:  CCA ^, Continue to hold calcitriol for now. Sevelamer  ha been ^ to 3/meals.  Continues to have outside food and refuses any diet except regular (chronic problem with him despite counseling).phos 6.3 . Hyperkalemia: K+ 5.3. Continue Lokelma 10g TID for now, using low bath intermittently with HD.  As noted above diet indiscretion despite counseling. Checking labs in AM. Prostate fluid collection/abscess = noted per admit team urology consulted at outside hospital per their assessment "fluid collection was 2/2 dilated seminal vesicle with no surg.  Intervention recommended/ID concern for possible abscess patient covered with Vanc. and recommendation for outpatient urology follow-up" Nutrition: Alb 3.4,  continue supplements.  T1DM: per primary Dispo: Insurance denied [facility] not an option because HD units to which they transport will not accept pt. Now possible placement in [facility].     Cochise Dinneen H. Kaydon Husby NP-C 11/24/2023, 9:42 AM  [employer] [phone]

## 2023-11-24 NOTE — Discharge Planning (Signed)
 [facility]  Initial Hemodialysis Orders  Dialysis center: [facility]  Patient's name: [name] DOB: 1981-01-03 ESRD transferring from [facility] as extensive hospitalization  Discharge diagnosis: MRSA bacteremia with MV/TV endocarditis + brain abscess  2.  Acute B CVA/septic emboli- due to above 3.   Acute LUE DVT: On Eliquis  Allergies: No Known Allergies   Dialysis Prescription: Dialysis Frequency: T,Th,S Tx duration: 3.5 hours  BFR: 400 DFR: Autoflow 1.5  EDW: 93.5  Temperature 36 degrees C Dialyzer: 180NRe UF profile/Sodium modeling?: URP 2 Dialysis Bath: 2.0 K 2.0 Ca  Dialysis access: Access type: [facility]  Date placed: 07/16/2023 Surgeon: Dr. Milford Cage  IR Needle gauge: NA  In Center Medications: Heparin Dose: None  Type: NA VDRA: Calcitriol/Hectorol per protocol Venofer: Per protocol Mircera: Per protocol.  Sensipar: NA  Discharge labs: Hgb: 9.9 K+: 5.8  Ca:8.9   Phos: 8.8 Alb: 3.4  Please draw monthy labs.   Alonna Buckler [facility] [phone]

## 2023-11-24 NOTE — Progress Notes (Signed)
 Physical Therapy Treatment Patient Details Name: [name] MRN: [id number] DOB: 1981/04/02 Today's Date: 11/24/2023   History of Present Illness 43 y.o. male presents to [facility] 09/09/23 as a transfer from [facility] for further management of endocarditis. Pt originally was admitted to [name] 07/09/23 for DKA and sepsis after being found on the ground. 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 10/17 pt had a R MCA, L occipital, and B cerebellum CVA w/ septic emboli. ICU admit 10/18 w/ intubation 10/18-10/24. Pt was transferred to [facility] for MV repair on 11/29. At [facility], pt had cardiac cath w/ non obstructive disease, SAH, a craniotomy w/ abscess drainage on 12/4, and R third toe distal necrosis. PMH: chronic L AKA, MRSA, IDDM, ESRD on HD MWF, HTN, TEE positive for vegetation on multiple valves, cellulitis, medical noncompliance, narcotic dependence, DM with gastroparesis, L internal jugular DVT.    PT Comments  Pt seen for PT tx with pt eventually agreeable to session. Pt requires max assist to pull trunk upright to transition from semi fowler to short sitting with RLE hanging off EOB. PT attempts to assist pt by using chuck pad to turn hips to square up to sitting EOB but pt continues to decline & attempts to maneuver himself without informing PT of what he's trying to do. PT encourages pt to reach anteriorly to challenge balance but pt attempts to do something else instead. Pt not very open to education & not very interested in working with a male therapist (even verbalizing this).  PT attempted to provide education re: capabilities of this therapist, importance of PT/pt communicating movements to keep both therapist & pt safe during mobility, & encourage OOB mobility with pt reporting "sitting in that chair aint doing nothing". Pt with LOB requiring assistance to lower himself back to semi fowler in bed. Pt left with all needs in reach, will continue to follow pt  acutely & progress mobility as able.   If plan is discharge home, recommend the following: Two people to help with walking and/or transfers;Assist for transportation;Supervision due to cognitive status;Help with stairs or ramp for entrance;Two people to help with bathing/dressing/bathroom;Assistance with cooking/housework;Direct supervision/assist for financial management;Direct supervision/assist for medications management   Can travel by private vehicle     No  Equipment Recommendations  Hoyer lift;Wheelchair (measurements PT);Wheelchair cushion (measurements PT);Other (comment)    Recommendations for Other Services Rehab consult     Precautions / Restrictions Precautions Precautions: Fall Precaution/Restrictions Comments: L AKA, L hemiparesis and edema, L neglect, bowel incontinence. L femoral HD cath Restrictions Weight Bearing Restrictions Per Provider Order: No Other Position/Activity Restrictions: Pt has sling for LUE when mobilizing OOB; Avoid using L prosthesis on HD catheter     Mobility  Bed Mobility               General bed mobility comments: supine>short sitting with RLE hanging off EOB with max assist to pull trunk to sitting    Transfers                        Ambulation/Gait                   Stairs             Wheelchair Mobility     Tilt Bed    Modified Rankin (Stroke Patients Only)       Balance Overall balance assessment: Needs assistance Sitting-balance support: Feet unsupported, Feet  supported, Single extremity supported Sitting balance-[name]: Poor                                      Communication    Cognition Arousal: Alert Behavior During Therapy: Flat affect   PT - Cognitive impairments: Problem solving, Safety/Judgement, Initiation                       PT - Cognition Comments: Pt internally distracted/easily distracted throughout session,   Following commands  impaired: Follows one step commands inconsistently, Follows one step commands with increased time    Cueing    Exercises      General Comments        Pertinent Vitals/Pain Pain Assessment Pain Assessment: Faces Faces Pain Scale: No hurt    Home Living                          Prior Function            PT Goals (current goals can now be found in the care plan section) Acute Rehab PT Goals Patient Stated Goal: "get out of the hospital"; wants his LUE to move better PT Goal Formulation: With patient Time For Goal Achievement: 11/29/23 Potential to Achieve Goals: Fair Progress towards PT goals: Progressing toward goals    Frequency    Min 1X/week      PT Plan      Co-evaluation              AM-PAC PT "6 Clicks" Mobility   Outcome Measure  Help needed turning from your back to your side while in a flat bed without using bedrails?: A Lot Help needed moving from lying on your back to sitting on the side of a flat bed without using bedrails?: Total Help needed moving to and from a bed to a chair (including a wheelchair)?: Total Help needed standing up from a chair using your arms (e.g., wheelchair or bedside chair)?: Total Help needed to walk in hospital room?: Total Help needed climbing 3-5 steps with a railing? : Total 6 Click Score: 7    End of Session   Activity Tolerance: Patient tolerated treatment well (pt self limiting) Patient left: in bed;with call bell/phone within reach;with bed alarm set   PT Visit Diagnosis: Other abnormalities of gait and mobility (R26.89);Other symptoms and signs involving the nervous system (R29.898);Hemiplegia and hemiparesis;Muscle weakness (generalized) (M62.81) Hemiplegia - Right/Left: Left Hemiplegia - dominant/non-dominant: Non-dominant Hemiplegia - caused by: Cerebral infarction     Time: 1335-1350 PT Time Calculation (min) (ACUTE ONLY): 15 min  Charges:    $Therapeutic Activity: 8-22 mins PT  General Charges $$ ACUTE PT VISIT: 1 Visit                     Aleda Grana, PT, DPT 11/24/23, 2:01 PM  Sandi Mariscal 11/24/2023, 1:58 PM

## 2023-11-24 NOTE — Progress Notes (Addendum)
 Met with pt at bedside this morning to discuss out-pt HD schedule at [facility]. Pt agreeable to plan and schedule letter provided to pt as well. Pt can start at clinic tomorrow (TTS arrive at 6:50 am for 7:05 am chair time). Arrangements added to AVS and renal NP has completed orders for clinic. Plan is for pt to d/c today per staff. CSW to remind snf to send hoyer pad with pt to HD appts. This info placed on AVS as well. Clinic advised of pt's planned d/c for today and aware to expect pt tomorrow.   Olivia Canter Renal Navigator [phone]

## 2023-11-25 DIAGNOSIS — I639 Cerebral infarction, unspecified: Secondary | ICD-10-CM | POA: Diagnosis not present

## 2023-11-25 DIAGNOSIS — G8929 Other chronic pain: Secondary | ICD-10-CM | POA: Diagnosis not present

## 2023-11-25 DIAGNOSIS — Z992 Dependence on renal dialysis: Secondary | ICD-10-CM | POA: Diagnosis not present

## 2023-11-25 DIAGNOSIS — E109 Type 1 diabetes mellitus without complications: Secondary | ICD-10-CM | POA: Diagnosis not present

## 2023-11-25 DIAGNOSIS — N186 End stage renal disease: Secondary | ICD-10-CM | POA: Diagnosis not present

## 2023-11-25 DIAGNOSIS — Z8679 Personal history of other diseases of the circulatory system: Secondary | ICD-10-CM | POA: Diagnosis not present

## 2023-11-25 DIAGNOSIS — I951 Orthostatic hypotension: Secondary | ICD-10-CM | POA: Diagnosis not present

## 2023-11-25 DIAGNOSIS — Z89512 Acquired absence of left leg below knee: Secondary | ICD-10-CM | POA: Diagnosis not present

## 2023-11-26 DIAGNOSIS — N186 End stage renal disease: Secondary | ICD-10-CM | POA: Diagnosis not present

## 2023-11-26 DIAGNOSIS — R531 Weakness: Secondary | ICD-10-CM | POA: Diagnosis not present

## 2023-11-26 DIAGNOSIS — G8929 Other chronic pain: Secondary | ICD-10-CM | POA: Diagnosis not present

## 2023-11-26 DIAGNOSIS — I639 Cerebral infarction, unspecified: Secondary | ICD-10-CM | POA: Diagnosis not present

## 2023-11-26 DIAGNOSIS — E109 Type 1 diabetes mellitus without complications: Secondary | ICD-10-CM | POA: Diagnosis not present

## 2023-11-27 DIAGNOSIS — Z794 Long term (current) use of insulin: Secondary | ICD-10-CM | POA: Diagnosis not present

## 2023-11-27 DIAGNOSIS — R4189 Other symptoms and signs involving cognitive functions and awareness: Secondary | ICD-10-CM | POA: Diagnosis not present

## 2023-11-27 DIAGNOSIS — Z89621 Acquired absence of right hip joint: Secondary | ICD-10-CM | POA: Diagnosis not present

## 2023-11-27 DIAGNOSIS — I82722 Chronic embolism and thrombosis of deep veins of left upper extremity: Secondary | ICD-10-CM | POA: Diagnosis not present

## 2023-11-27 DIAGNOSIS — K3184 Gastroparesis: Secondary | ICD-10-CM | POA: Diagnosis not present

## 2023-11-27 DIAGNOSIS — I499 Cardiac arrhythmia, unspecified: Secondary | ICD-10-CM | POA: Diagnosis not present

## 2023-11-27 DIAGNOSIS — E1143 Type 2 diabetes mellitus with diabetic autonomic (poly)neuropathy: Secondary | ICD-10-CM | POA: Diagnosis not present

## 2023-11-27 DIAGNOSIS — Z992 Dependence on renal dialysis: Secondary | ICD-10-CM | POA: Diagnosis not present

## 2023-11-27 DIAGNOSIS — R601 Generalized edema: Secondary | ICD-10-CM | POA: Diagnosis not present

## 2023-11-27 DIAGNOSIS — E43 Unspecified severe protein-calorie malnutrition: Secondary | ICD-10-CM | POA: Diagnosis not present

## 2023-11-27 DIAGNOSIS — R41841 Cognitive communication deficit: Secondary | ICD-10-CM | POA: Diagnosis not present

## 2023-11-27 DIAGNOSIS — R160 Hepatomegaly, not elsewhere classified: Secondary | ICD-10-CM | POA: Diagnosis not present

## 2023-11-27 DIAGNOSIS — F4322 Adjustment disorder with anxiety: Secondary | ICD-10-CM | POA: Diagnosis not present

## 2023-11-27 DIAGNOSIS — Z7901 Long term (current) use of anticoagulants: Secondary | ICD-10-CM | POA: Diagnosis not present

## 2023-11-27 DIAGNOSIS — I10 Essential (primary) hypertension: Secondary | ICD-10-CM | POA: Diagnosis not present

## 2023-11-27 DIAGNOSIS — M7989 Other specified soft tissue disorders: Secondary | ICD-10-CM | POA: Diagnosis not present

## 2023-11-27 DIAGNOSIS — R404 Transient alteration of awareness: Secondary | ICD-10-CM | POA: Diagnosis not present

## 2023-11-27 DIAGNOSIS — Z79899 Other long term (current) drug therapy: Secondary | ICD-10-CM | POA: Diagnosis not present

## 2023-11-27 DIAGNOSIS — R609 Edema, unspecified: Secondary | ICD-10-CM | POA: Diagnosis not present

## 2023-11-27 DIAGNOSIS — R279 Unspecified lack of coordination: Secondary | ICD-10-CM | POA: Diagnosis not present

## 2023-11-27 DIAGNOSIS — N186 End stage renal disease: Secondary | ICD-10-CM | POA: Diagnosis not present

## 2023-11-27 DIAGNOSIS — M6281 Muscle weakness (generalized): Secondary | ICD-10-CM | POA: Diagnosis not present

## 2023-11-27 DIAGNOSIS — G8929 Other chronic pain: Secondary | ICD-10-CM | POA: Diagnosis not present

## 2023-11-27 DIAGNOSIS — I69391 Dysphagia following cerebral infarction: Secondary | ICD-10-CM | POA: Diagnosis not present

## 2023-11-27 DIAGNOSIS — Z743 Need for continuous supervision: Secondary | ICD-10-CM | POA: Diagnosis not present

## 2023-11-27 DIAGNOSIS — I469 Cardiac arrest, cause unspecified: Secondary | ICD-10-CM | POA: Diagnosis not present

## 2023-11-27 DIAGNOSIS — I63311 Cerebral infarction due to thrombosis of right middle cerebral artery: Secondary | ICD-10-CM | POA: Diagnosis not present

## 2023-11-27 DIAGNOSIS — D509 Iron deficiency anemia, unspecified: Secondary | ICD-10-CM | POA: Diagnosis not present

## 2023-11-27 DIAGNOSIS — J9811 Atelectasis: Secondary | ICD-10-CM | POA: Diagnosis not present

## 2023-11-27 DIAGNOSIS — R0989 Other specified symptoms and signs involving the circulatory and respiratory systems: Secondary | ICD-10-CM | POA: Diagnosis not present

## 2023-11-27 DIAGNOSIS — I639 Cerebral infarction, unspecified: Secondary | ICD-10-CM | POA: Diagnosis not present

## 2023-11-27 DIAGNOSIS — I517 Cardiomegaly: Secondary | ICD-10-CM | POA: Diagnosis not present

## 2023-11-27 DIAGNOSIS — R1311 Dysphagia, oral phase: Secondary | ICD-10-CM | POA: Diagnosis not present

## 2023-11-27 DIAGNOSIS — R600 Localized edema: Secondary | ICD-10-CM | POA: Diagnosis not present

## 2023-11-27 DIAGNOSIS — N2581 Secondary hyperparathyroidism of renal origin: Secondary | ICD-10-CM | POA: Diagnosis not present

## 2023-11-27 DIAGNOSIS — I82622 Acute embolism and thrombosis of deep veins of left upper extremity: Secondary | ICD-10-CM | POA: Diagnosis not present

## 2023-11-27 DIAGNOSIS — R0902 Hypoxemia: Secondary | ICD-10-CM | POA: Diagnosis not present

## 2023-11-27 DIAGNOSIS — I120 Hypertensive chronic kidney disease with stage 5 chronic kidney disease or end stage renal disease: Secondary | ICD-10-CM | POA: Diagnosis not present

## 2023-11-27 DIAGNOSIS — E109 Type 1 diabetes mellitus without complications: Secondary | ICD-10-CM | POA: Diagnosis not present

## 2023-11-27 DIAGNOSIS — E785 Hyperlipidemia, unspecified: Secondary | ICD-10-CM | POA: Diagnosis not present

## 2023-11-27 DIAGNOSIS — R69 Illness, unspecified: Secondary | ICD-10-CM | POA: Diagnosis not present

## 2023-11-27 DIAGNOSIS — R55 Syncope and collapse: Secondary | ICD-10-CM | POA: Diagnosis not present

## 2023-11-27 DIAGNOSIS — R509 Fever, unspecified: Secondary | ICD-10-CM | POA: Diagnosis not present

## 2023-11-27 DIAGNOSIS — R531 Weakness: Secondary | ICD-10-CM | POA: Diagnosis not present

## 2023-11-29 DIAGNOSIS — I639 Cerebral infarction, unspecified: Secondary | ICD-10-CM | POA: Diagnosis not present

## 2023-11-29 DIAGNOSIS — F4322 Adjustment disorder with anxiety: Secondary | ICD-10-CM | POA: Diagnosis not present

## 2023-11-29 DIAGNOSIS — R531 Weakness: Secondary | ICD-10-CM | POA: Diagnosis not present

## 2023-11-29 DIAGNOSIS — E109 Type 1 diabetes mellitus without complications: Secondary | ICD-10-CM | POA: Diagnosis not present

## 2023-11-29 DIAGNOSIS — G8929 Other chronic pain: Secondary | ICD-10-CM | POA: Diagnosis not present

## 2023-11-29 DIAGNOSIS — N186 End stage renal disease: Secondary | ICD-10-CM | POA: Diagnosis not present

## 2023-11-30 ENCOUNTER — Emergency Department (HOSPITAL_COMMUNITY)
Admission: EM | Admit: 2023-11-30 | Discharge: 2023-11-30 | Disposition: A | Discharge status: Home / Self Care | Attending: Emergency Medicine | Admitting: Emergency Medicine

## 2023-11-30 ENCOUNTER — Emergency Department (HOSPITAL_COMMUNITY)

## 2023-11-30 ENCOUNTER — Encounter (HOSPITAL_COMMUNITY): Payer: Self-pay

## 2023-11-30 ENCOUNTER — Other Ambulatory Visit: Payer: Self-pay

## 2023-11-30 DIAGNOSIS — I120 Hypertensive chronic kidney disease with stage 5 chronic kidney disease or end stage renal disease: Secondary | ICD-10-CM | POA: Insufficient documentation

## 2023-11-30 DIAGNOSIS — Z743 Need for continuous supervision: Secondary | ICD-10-CM | POA: Diagnosis not present

## 2023-11-30 DIAGNOSIS — R600 Localized edema: Secondary | ICD-10-CM | POA: Diagnosis not present

## 2023-11-30 DIAGNOSIS — N186 End stage renal disease: Secondary | ICD-10-CM | POA: Insufficient documentation

## 2023-11-30 DIAGNOSIS — I82622 Acute embolism and thrombosis of deep veins of left upper extremity: Secondary | ICD-10-CM | POA: Diagnosis not present

## 2023-11-30 DIAGNOSIS — E109 Type 1 diabetes mellitus without complications: Secondary | ICD-10-CM | POA: Diagnosis not present

## 2023-11-30 DIAGNOSIS — I517 Cardiomegaly: Secondary | ICD-10-CM | POA: Diagnosis not present

## 2023-11-30 DIAGNOSIS — R0902 Hypoxemia: Secondary | ICD-10-CM | POA: Diagnosis not present

## 2023-11-30 DIAGNOSIS — Z79899 Other long term (current) drug therapy: Secondary | ICD-10-CM | POA: Insufficient documentation

## 2023-11-30 DIAGNOSIS — M7989 Other specified soft tissue disorders: Secondary | ICD-10-CM | POA: Diagnosis not present

## 2023-11-30 DIAGNOSIS — Z794 Long term (current) use of insulin: Secondary | ICD-10-CM | POA: Insufficient documentation

## 2023-11-30 DIAGNOSIS — R609 Edema, unspecified: Secondary | ICD-10-CM | POA: Diagnosis not present

## 2023-11-30 DIAGNOSIS — Z7901 Long term (current) use of anticoagulants: Secondary | ICD-10-CM | POA: Diagnosis not present

## 2023-11-30 DIAGNOSIS — R601 Generalized edema: Secondary | ICD-10-CM | POA: Diagnosis not present

## 2023-11-30 DIAGNOSIS — R509 Fever, unspecified: Secondary | ICD-10-CM | POA: Diagnosis not present

## 2023-11-30 DIAGNOSIS — Z992 Dependence on renal dialysis: Secondary | ICD-10-CM | POA: Diagnosis not present

## 2023-11-30 DIAGNOSIS — R0989 Other specified symptoms and signs involving the circulatory and respiratory systems: Secondary | ICD-10-CM | POA: Diagnosis not present

## 2023-11-30 DIAGNOSIS — I82722 Chronic embolism and thrombosis of deep veins of left upper extremity: Secondary | ICD-10-CM | POA: Diagnosis not present

## 2023-11-30 DIAGNOSIS — J9811 Atelectasis: Secondary | ICD-10-CM | POA: Diagnosis not present

## 2023-11-30 LAB — CBC WITH DIFFERENTIAL/PLATELET
Abs Immature Granulocytes: 0.01 10*3/uL (ref 0.00–0.07)
Basophils Absolute: 0 10*3/uL (ref 0.0–0.1)
Basophils Relative: 1 %
Eosinophils Absolute: 0.2 10*3/uL (ref 0.0–0.5)
Eosinophils Relative: 6 %
HCT: 36.2 % — ABNORMAL LOW (ref 39.0–52.0)
Hemoglobin: 10.3 g/dL — ABNORMAL LOW (ref 13.0–17.0)
Immature Granulocytes: 0 %
Lymphocytes Relative: 21 %
Lymphs Abs: 0.7 10*3/uL (ref 0.7–4.0)
MCH: 27.2 pg (ref 26.0–34.0)
MCHC: 28.5 g/dL — ABNORMAL LOW (ref 30.0–36.0)
MCV: 95.8 fL (ref 80.0–100.0)
Monocytes Absolute: 0.3 10*3/uL (ref 0.1–1.0)
Monocytes Relative: 8 %
Neutro Abs: 2.3 10*3/uL (ref 1.7–7.7)
Neutrophils Relative %: 64 %
Platelets: 121 10*3/uL — ABNORMAL LOW (ref 150–400)
RBC: 3.78 MIL/uL — ABNORMAL LOW (ref 4.22–5.81)
RDW: 16.1 % — ABNORMAL HIGH (ref 11.5–15.5)
WBC: 3.5 10*3/uL — ABNORMAL LOW (ref 4.0–10.5)
nRBC: 0 % (ref 0.0–0.2)

## 2023-11-30 LAB — COMPREHENSIVE METABOLIC PANEL
ALT: 9 U/L (ref 0–44)
AST: 13 U/L — ABNORMAL LOW (ref 15–41)
Albumin: 3.9 g/dL (ref 3.5–5.0)
Alkaline Phosphatase: 130 U/L — ABNORMAL HIGH (ref 38–126)
Anion gap: 16 — ABNORMAL HIGH (ref 5–15)
BUN: 58 mg/dL — ABNORMAL HIGH (ref 6–20)
CO2: 26 mmol/L (ref 22–32)
Calcium: 9.2 mg/dL (ref 8.9–10.3)
Chloride: 92 mmol/L — ABNORMAL LOW (ref 98–111)
Creatinine, Ser: 9.28 mg/dL — ABNORMAL HIGH (ref 0.61–1.24)
GFR, Estimated: 7 mL/min — ABNORMAL LOW (ref >60)
Glucose, Bld: 179 mg/dL — ABNORMAL HIGH (ref 70–99)
Potassium: 4.7 mmol/L (ref 3.5–5.1)
Sodium: 134 mmol/L — ABNORMAL LOW (ref 135–145)
Total Bilirubin: 0.8 mg/dL (ref 0.0–1.2)
Total Protein: 7.9 g/dL (ref 6.5–8.1)

## 2023-11-30 LAB — PROTIME-INR
INR: 1.9 — ABNORMAL HIGH (ref 0.8–1.2)
Prothrombin Time: 22.2 s — ABNORMAL HIGH (ref 11.4–15.2)

## 2023-11-30 LAB — LACTIC ACID, PLASMA
Lactic Acid, Venous: 1.2 mmol/L (ref 0.5–1.9)
Lactic Acid, Venous: 1.3 mmol/L (ref 0.5–1.9)

## 2023-11-30 LAB — APTT: aPTT: 45 s — ABNORMAL HIGH (ref 24–36)

## 2023-11-30 MED ORDER — HYDROMORPHONE HCL 1 MG/ML IJ SOLN
0.5000 mg | Freq: Once | INTRAMUSCULAR | Status: AC
  Administered 2023-11-30: 0.5 mg via INTRAVENOUS
  Filled 2023-11-30: qty 0.5

## 2023-11-30 NOTE — ED Provider Notes (Signed)
 [place] EMERGENCY DEPARTMENT AT [facility] Provider Note   CSN: [id number] Arrival date & time: 11/30/23  1011     History  Chief Complaint  Patient presents with   Generalized Edema    [name] is a 43 y.o. male.  Past medical history including type 1 diabetes on insulin, ESRD on dialysis Tuesday Thursday Saturday, left AKA, hypertension.  He recently had an extended hospital stay for sepsis with bacterial endocarditis, had stroke related to septic emboli and developed a brain abscess.  He was discharged to [facility] rehab on 11/24/2023.  Scented today for increased swelling to left upper extremity, has a known DVT in this arm and is on Eliquis.  Patient states he has pain in that arm, repeatedly asking for Dilaudid but she states he was on in the hospital was controlling his pain but the oxycodone at the facility is not helping his symptoms.  Last dialysis was on Saturday.  Due for dialysis today.  Charge so patient does have some cognitive impairment from the brain abscess and stroke  HPI     Home Medications Prior to Admission medications   Medication Sig Start Date End Date Taking? Authorizing Provider  acetaminophen (TYLENOL) 325 MG tablet Take 2 tablets (650 mg total) by mouth every 6 (six) hours as needed for mild pain (pain score 1-3), fever, moderate pain (pain score 4-6) or headache. 08/27/23   Dorcas Carrow, MD  albuterol (PROVENTIL) (2.5 MG/3ML) 0.083% nebulizer solution Take 3 mLs (2.5 mg total) by nebulization every 6 (six) hours as needed for wheezing or shortness of breath. Patient not taking: Reported on 09/09/2023 08/27/23   Dorcas Carrow, MD  apixaban (ELIQUIS) 5 MG TABS tablet Take 1 tablet (5 mg total) by mouth 2 (two) times daily. 08/27/23   Dorcas Carrow, MD  calcitRIOL (ROCALTROL) 0.5 MCG capsule Take 3 capsules (1.5 mcg total) by mouth every Monday, Wednesday, and Friday with hemodialysis. 05/23/21   Kathlen Mody, MD  clonazePAM  (KLONOPIN) 0.5 MG tablet Take 0.5 tablets (0.25 mg total) by mouth 2 (two) times daily as needed for anxiety. 11/24/23   [name], MD  docusate sodium (COLACE) 100 MG capsule Take 1 capsule (100 mg total) by mouth 2 (two) times daily. 11/24/23   [name], MD  gabapentin (NEURONTIN) 100 MG capsule Take 100 mg by mouth 3 (three) times daily.    [provider]  hydrocortisone (ANUSOL-HC) 25 MG suppository Place 1 suppository (25 mg total) rectally 2 (two) times daily. 11/24/23   [name], MD  insulin glargine (LANTUS) 100 UNIT/ML injection Inject 0.06 mLs (6 Units total) into the skin daily. 11/25/23   [name], MD  midodrine (PROAMATINE) 10 MG tablet Take 1 tablet (10 mg total) by mouth Every Tuesday,Thursday,and Saturday with dialysis. 11/25/23   [name], MD  Nutritional Supplements (,FEEDING SUPPLEMENT, PROSOURCE PLUS) liquid Take 30 mLs by mouth 2 (two) times daily between meals. 11/25/23   [name], MD  omeprazole (PRILOSEC) 20 MG capsule Take 20 mg by mouth daily. 09/10/23 12/09/23  [provider]  ondansetron (ZOFRAN-ODT) 4 MG disintegrating tablet Take 4 mg by mouth every 6 (six) hours as needed for nausea or vomiting. 09/09/23 12/08/23  [provider]  oxyCODONE (OXY IR/ROXICODONE) 5 MG immediate release tablet Take 1 tablet (5 mg total) by mouth every 4 (four) hours as needed for severe pain (pain score 7-10). 11/24/23   [name], MD  polyethylene glycol (MIRALAX / GLYCOLAX) 17 g packet Take 17 g by mouth daily. 11/25/23   [name], MD  QUEtiapine (SEROQUEL) 25 MG tablet Take 0.5 tablets (12.5 mg total) by mouth in the morning AND 1 tablet (25 mg total) at bedtime. 11/24/23   [name], MD  sevelamer (RENAGEL) 800 MG tablet Take 800 mg by mouth 3 (three) times daily with meals. 09/09/23 12/08/23  [provider]  sodium zirconium cyclosilicate (LOKELMA) 10 g PACK packet Take 10 g by mouth 3 (three) times  daily. 11/24/23   [name], MD      Allergies    Patient has no known allergies.    Review of Systems   Review of Systems  Physical Exam Updated Vital Signs BP 113/88   Pulse 83   Temp 98.5 F (36.9 C) (Oral)   Resp 15   Ht 5\' 7"  (1.702 m)   Wt 98.3 kg   SpO2 97%   BMI 33.94 kg/m  Physical Exam Vitals and nursing note reviewed.  Constitutional:      General: He is not in acute distress.    Appearance: He is well-developed.  HENT:     Head: Normocephalic and atraumatic.  Eyes:     Extraocular Movements: Extraocular movements intact.     Conjunctiva/sclera: Conjunctivae normal.     Pupils: Pupils are equal, round, and reactive to light.  Cardiovascular:     Rate and Rhythm: Normal rate and regular rhythm.     Heart sounds: No murmur heard. Pulmonary:     Effort: Pulmonary effort is normal. No respiratory distress.     Breath sounds: Normal breath sounds.  Abdominal:     Palpations: Abdomen is soft.     Tenderness: There is no abdominal tenderness.  Musculoskeletal:     Cervical back: Neck supple.     Comments: Pitting edema noted to left upper extremity diffusely.  Radial pulse intact.  Left AKA noted, no lower extremity edema bilaterally.  Skin:    General: Skin is warm and dry.     Capillary Refill: Capillary refill takes less than 2 seconds.  Neurological:     General: No focal deficit present.     Mental Status: He is alert and oriented to person, place, and time.     Motor: No weakness.  Psychiatric:        Mood and Affect: Mood normal.     ED Results / Procedures / Treatments   Labs (all labs ordered are listed, but only abnormal results are displayed) Labs Reviewed  COMPREHENSIVE METABOLIC PANEL - Abnormal; Notable for the following components:      Result Value   Sodium 134 (*)    Chloride 92 (*)    Glucose, Bld 179 (*)    BUN 58 (*)    Creatinine, Ser 9.28 (*)    AST 13 (*)    Alkaline Phosphatase 130 (*)    GFR, Estimated 7 (*)     Anion gap 16 (*)    All other components within normal limits  CBC WITH DIFFERENTIAL/PLATELET - Abnormal; Notable for the following components:   WBC 3.5 (*)    RBC 3.78 (*)    Hemoglobin 10.3 (*)    HCT 36.2 (*)    MCHC 28.5 (*)    RDW 16.1 (*)    Platelets 121 (*)    All other components within normal limits  PROTIME-INR - Abnormal; Notable for the following components:   Prothrombin  Time 22.2 (*)    INR 1.9 (*)    All other components within normal limits  APTT - Abnormal; Notable for the following components:   aPTT 45 (*)    All other components within normal limits  CULTURE, BLOOD (ROUTINE X 2)  LACTIC ACID, PLASMA  LACTIC ACID, PLASMA    EKG None  Radiology US Venous Img Upper Left (DVT Study) Result Date: 11/30/2023 CLINICAL DATA:  known dvt, worsened swelling EXAM: LEFT UPPER EXTREMITY VENOUS DOPPLER ULTRASOUND TECHNIQUE: Gray-scale sonography with graded compression, as well as color Doppler and duplex ultrasound were performed to evaluate the upper extremity deep venous system from the level of the subclavian vein and including the jugular, axillary, basilic, radial, ulnar and upper cephalic vein. Spectral Doppler was utilized to evaluate flow at rest and with distal augmentation maneuvers. COMPARISON:  CT chest, 10/09/2023. FINDINGS: VENOUS Heterogeneously-echogenic, partially-occlusive filling defects within the imaged portions of the LEFT internal jugular vein. Incomplete visualization of the subclavian and brachial veins, with patent imaged segments. Abandoned LEFT upper extremity dialysis access. Unable to visualize the LEFT axillary and basilic veins. Patent peripheral LEFT upper extremity veins, with normal appearance of the imaged radial and ulnar veins. Limited view of the contralateral subclavian vein appears patent. OTHER No evidence of superficial thrombophlebitis or abnormal fluid collection. Subcutaneous edema of the imaged LEFT upper extremity. Limitations:  Patient body habitus IMPRESSION: Suboptimal evaluation, within these constraints; 1. Chronic-appearing LEFT upper extremity DVT, involving the LEFT internal jugular vein. This was documented on prior catheter venogram (07/16/2023). 2. Challenging evaluation of the peripheral LEFT upper extremity veins, secondary to abandoned dialysis access sites, edema and patient factors. Roanna Banning, MD [facility] Radiology Electronically Signed   By: Roanna Banning M.D.   On: 11/30/2023 13:30   DG Chest Port 1 View Result Date: 11/30/2023 CLINICAL DATA:  Dialysis patient, concern for sepsis, left arm swelling and redness EXAM: PORTABLE CHEST 1 VIEW COMPARISON:  09/16/2023 FINDINGS: Limited portable semi upright exam. Obese body habitus. Very low lung volumes with bibasilar atelectasis and suspected small effusions. Heart is enlarged. Axillary vascular stents noted bilaterally. No pneumothorax. IMPRESSION: Low volume exam with bibasilar atelectasis and suspected small effusions. Very limited exam. Electronically Signed   By: Judie Petit.  Shick M.D.   On: 11/30/2023 12:28    Procedures Procedures    Medications Ordered in ED Medications  HYDROmorphone (DILAUDID) injection 0.5 mg (0.5 mg Intravenous Given 11/30/23 1143)    ED Course/ Medical Decision Making/ A&P Clinical Course as of 11/30/23 1743  Tue Nov 30, 2023  1659 Here for evaluation of left arm swelling, has known DVT, had persistent DVT on ultrasound shortly before discharge from hospital, today limited study due to swelling and patient factors, shows chronic DVT in left IJ, patient states his symptoms have been the same but the pain medication at the facility is not helping and repeatedly he was asking for Dilaudid.  Initial blood pressure reading was low but all subsequent readings were normal so he is given half milligram of Dilaudid.  Remainder of his workup was reassuring, he missed dialysis today but does not  emergently need dialysis, he is not hypoxic.  He did become extremely somnolent with the small dose of Dilaudid but is now back to his baseline.  I would recommend for him to follow-up with the DVT clinic for further monitoring of this DVT and continue his Eliquis which he has reportedly been compliant with her [facility]. [CB]  Clinical Course User Index [CB] Ma Rings, PA-C                                 Medical Decision Making Differential diagnosis includes but not limited to worsening DVT, Eliquis failure, cellulitis, other  ED course: Patient is a chronically ill individual who had recent discharge from hospital after 100 days today for endocarditis which subsequently caused him to have septic emboli causing stroke and brain abscess and development of left upper extremity DVT.  I extensively reviewed his notes and discharge summary.  I also reviewed nursing home notes.  I spoke with patient's nurse at [facility], she states that he has had increased pain in the left arm and has swelling and they are worried about a blood clot.  Patient has a known DVT in the left upper extremity.  I did confirm with her that he has been compliant with his Eliquis and she states it is started that he has gotten it consistently since coming to their facility.  Patient reports to me that his symptoms are the same but the oxycodone at the nursing home does not control his pain well and he repeatedly was asking for Dilaudid.  Initial lower shows low but on repeat without intervention has been normal so he was given half milligram of Dilaudid.  His ultrasound showed chronic partially occlusive DVT, labs otherwise reassuring.  He does have edema in the left upper extremity but has intact pulses, normal range of motion.  Discussed with Dr. Charm Barges, plan to send home to follow-up with DVT clinic, he had had persistent DVT upon discharge from the hospital and plan was to continue the Eliquis. Patient did  become very somnolent after the Dilaudid but now is awake and alert and back to his baseline, he is asking for food and was provided with some crackers and his uncles bring him some [employer], awaiting transport back to the nursing home.  He is asking for another dose of Dilaudid but we discussed with him that this made him very sleepy and do not think this is best for him at this time.  Advised on close outpatient follow-up and strict return precautions.  Amount and/or Complexity of Data Reviewed Labs: ordered. Decision-making details documented in ED Course.    Details: Labs are at baseline, BUN and creatinine elevated consistent with patient needing dialysis but potassium normal, CBC shows white blood cell count and hemoglobin at baseline, patient does not make urine so no UA. Radiology: ordered.  Risk Prescription drug management.           Final Clinical Impression(s) / ED Diagnoses Final diagnoses:  Arm DVT (deep venous thromboembolism), chronic, left [facility])    Rx / DC Orders ED Discharge Orders          Ordered    Ambulatory referral to Vascular Surgery       Comments: FU DVT left upper extremity   11/30/23 1651              [name] 11/30/23 1746    Terrilee Files, MD 11/30/23 1752

## 2023-11-30 NOTE — ED Notes (Signed)
 Dietary to bring sandwich and chips. [name]

## 2023-11-30 NOTE — ED Triage Notes (Signed)
 Pt BIB [facility] for left arm swelling, redness, and also goes into left side chest. When palpating chest hard to touch. Pt get dialysis in left leg. Pt states he was do for dialysis today. Pt denies fever and chills. Pt keeps requesting food during triage.

## 2023-11-30 NOTE — Discharge Instructions (Signed)
 Is a pleasure taking care of you today.  He is seen for left arm pain and swelling.  Ultrasound showed a chronic partially occluded DVT, though the study was somewhat limited.  Continue taking the Eliquis which is your blood thinner as prescribed and follow-up with the DVT clinic to follow this.  Come back if you have increased pain or swelling, shortness of breath or other complaints.

## 2023-12-01 DIAGNOSIS — I82622 Acute embolism and thrombosis of deep veins of left upper extremity: Secondary | ICD-10-CM | POA: Diagnosis not present

## 2023-12-01 DIAGNOSIS — G8929 Other chronic pain: Secondary | ICD-10-CM | POA: Diagnosis not present

## 2023-12-01 DIAGNOSIS — E109 Type 1 diabetes mellitus without complications: Secondary | ICD-10-CM | POA: Diagnosis not present

## 2023-12-01 DIAGNOSIS — N186 End stage renal disease: Secondary | ICD-10-CM | POA: Diagnosis not present

## 2023-12-01 DIAGNOSIS — I639 Cerebral infarction, unspecified: Secondary | ICD-10-CM | POA: Diagnosis not present

## 2023-12-01 DIAGNOSIS — R531 Weakness: Secondary | ICD-10-CM | POA: Diagnosis not present

## 2023-12-02 DIAGNOSIS — Z743 Need for continuous supervision: Secondary | ICD-10-CM | POA: Diagnosis not present

## 2023-12-02 DIAGNOSIS — R404 Transient alteration of awareness: Secondary | ICD-10-CM | POA: Diagnosis not present

## 2023-12-02 DIAGNOSIS — I499 Cardiac arrhythmia, unspecified: Secondary | ICD-10-CM | POA: Diagnosis not present

## 2023-12-02 DIAGNOSIS — R55 Syncope and collapse: Secondary | ICD-10-CM | POA: Diagnosis not present

## 2023-12-02 DIAGNOSIS — I469 Cardiac arrest, cause unspecified: Secondary | ICD-10-CM | POA: Diagnosis not present

## 2023-12-05 LAB — CULTURE, BLOOD (ROUTINE X 2): Culture: NO GROWTH

## 2023-12-28 DEATH — deceased

## 2024-02-28 ENCOUNTER — Encounter (INDEPENDENT_AMBULATORY_CARE_PROVIDER_SITE_OTHER): Payer: Medicare HMO

## 2024-03-23 ENCOUNTER — Telehealth (INDEPENDENT_AMBULATORY_CARE_PROVIDER_SITE_OTHER): Payer: Self-pay

## 2024-03-23 NOTE — Telephone Encounter (Signed)
 Pt is over due for an appt with provider
